# Patient Record
Sex: Female | Born: 1960
Health system: Southern US, Community
[De-identification: ages and names within clinical notes are randomized; demographics above are authoritative.]

## PROBLEM LIST (undated history)

## (undated) DIAGNOSIS — M1009 Idiopathic gout, multiple sites: Secondary | ICD-10-CM

## (undated) DIAGNOSIS — E782 Mixed hyperlipidemia: Secondary | ICD-10-CM

## (undated) DIAGNOSIS — F5101 Primary insomnia: Secondary | ICD-10-CM

## (undated) DIAGNOSIS — K219 Gastro-esophageal reflux disease without esophagitis: Secondary | ICD-10-CM

## (undated) DIAGNOSIS — I2510 Atherosclerotic heart disease of native coronary artery without angina pectoris: Secondary | ICD-10-CM

## (undated) DIAGNOSIS — E079 Disorder of thyroid, unspecified: Secondary | ICD-10-CM

## (undated) DIAGNOSIS — E1121 Type 2 diabetes mellitus with diabetic nephropathy: Secondary | ICD-10-CM

## (undated) DIAGNOSIS — M069 Rheumatoid arthritis, unspecified: Secondary | ICD-10-CM

## (undated) DIAGNOSIS — F419 Anxiety disorder, unspecified: Secondary | ICD-10-CM

## (undated) DIAGNOSIS — J449 Chronic obstructive pulmonary disease, unspecified: Secondary | ICD-10-CM

## (undated) DIAGNOSIS — I639 Cerebral infarction, unspecified: Secondary | ICD-10-CM

## (undated) DIAGNOSIS — E109 Type 1 diabetes mellitus without complications: Secondary | ICD-10-CM

## (undated) DIAGNOSIS — G709 Myoneural disorder, unspecified: Secondary | ICD-10-CM

## (undated) DIAGNOSIS — I2489 Other forms of acute ischemic heart disease: Secondary | ICD-10-CM

## (undated) DIAGNOSIS — I480 Paroxysmal atrial fibrillation: Secondary | ICD-10-CM

## (undated) DIAGNOSIS — I110 Hypertensive heart disease with heart failure: Secondary | ICD-10-CM

## (undated) DIAGNOSIS — I249 Acute ischemic heart disease, unspecified: Secondary | ICD-10-CM

## (undated) DIAGNOSIS — N182 Chronic kidney disease, stage 2 (mild): Secondary | ICD-10-CM

## (undated) DIAGNOSIS — J189 Pneumonia, unspecified organism: Secondary | ICD-10-CM

## (undated) DIAGNOSIS — I469 Cardiac arrest, cause unspecified: Secondary | ICD-10-CM

## (undated) DIAGNOSIS — Z89412 Acquired absence of left great toe: Secondary | ICD-10-CM

## (undated) DIAGNOSIS — N189 Chronic kidney disease, unspecified: Secondary | ICD-10-CM

## (undated) DIAGNOSIS — I5030 Unspecified diastolic (congestive) heart failure: Secondary | ICD-10-CM

## (undated) DIAGNOSIS — R600 Localized edema: Secondary | ICD-10-CM

## (undated) DIAGNOSIS — E559 Vitamin D deficiency, unspecified: Secondary | ICD-10-CM

## (undated) DIAGNOSIS — E038 Other specified hypothyroidism: Secondary | ICD-10-CM

## (undated) DIAGNOSIS — E785 Hyperlipidemia, unspecified: Secondary | ICD-10-CM

## (undated) DIAGNOSIS — G629 Polyneuropathy, unspecified: Secondary | ICD-10-CM

## (undated) DIAGNOSIS — I10 Essential (primary) hypertension: Secondary | ICD-10-CM

## (undated) DIAGNOSIS — D649 Anemia, unspecified: Secondary | ICD-10-CM

## (undated) DIAGNOSIS — M545 Low back pain, unspecified: Secondary | ICD-10-CM

## (undated) DIAGNOSIS — I5032 Chronic diastolic (congestive) heart failure: Secondary | ICD-10-CM

## (undated) DIAGNOSIS — G931 Anoxic brain damage, not elsewhere classified: Secondary | ICD-10-CM

## (undated) DIAGNOSIS — R0902 Hypoxemia: Secondary | ICD-10-CM

## (undated) DIAGNOSIS — G8929 Other chronic pain: Secondary | ICD-10-CM

## (undated) DIAGNOSIS — N3946 Mixed incontinence: Secondary | ICD-10-CM

## (undated) DIAGNOSIS — E119 Type 2 diabetes mellitus without complications: Secondary | ICD-10-CM

## (undated) DIAGNOSIS — I5022 Chronic systolic (congestive) heart failure: Secondary | ICD-10-CM

## (undated) DIAGNOSIS — I340 Nonrheumatic mitral (valve) insufficiency: Secondary | ICD-10-CM

## (undated) DIAGNOSIS — Z89422 Acquired absence of other left toe(s): Secondary | ICD-10-CM

## (undated) DIAGNOSIS — G4733 Obstructive sleep apnea (adult) (pediatric): Secondary | ICD-10-CM

## (undated) DIAGNOSIS — I209 Angina pectoris, unspecified: Secondary | ICD-10-CM

## (undated) DIAGNOSIS — R0600 Dyspnea, unspecified: Secondary | ICD-10-CM

## (undated) HISTORY — DX: Acquired absence of other left toe(s): Z89.422

## (undated) HISTORY — DX: Other chronic pain: G89.29

## (undated) HISTORY — DX: Hypoxemia: R09.02

## (undated) HISTORY — DX: Rheumatoid arthritis, unspecified: M06.9

## (undated) HISTORY — DX: Low back pain, unspecified: M54.50

## (undated) HISTORY — DX: Morbid (severe) obesity due to excess calories: E66.01

## (undated) HISTORY — DX: Gastro-esophageal reflux disease without esophagitis: K21.9

## (undated) HISTORY — DX: Primary insomnia: F51.01

## (undated) HISTORY — PX: ROTATOR CUFF REPAIR: SHX139

## (undated) HISTORY — DX: Mixed incontinence: N39.46

## (undated) HISTORY — DX: Hypertensive heart disease with heart failure: I11.0

## (undated) HISTORY — DX: Idiopathic gout, multiple sites: M10.09

## (undated) HISTORY — DX: Type 2 diabetes mellitus with diabetic nephropathy: E11.21

## (undated) HISTORY — DX: Acquired absence of left great toe: Z89.412

## (undated) HISTORY — PX: CATARACT EXTRACTION, BILATERAL: SHX1313

## (undated) HISTORY — DX: Anoxic brain damage, not elsewhere classified: G93.1

## (undated) HISTORY — DX: Other specified hypothyroidism: E03.8

## (undated) HISTORY — DX: Chronic diastolic (congestive) heart failure: I50.32

## (undated) HISTORY — DX: Acute ischemic heart disease, unspecified: I24.9

## (undated) HISTORY — DX: Vitamin D deficiency, unspecified: E55.9

## (undated) HISTORY — DX: Disorder of thyroid, unspecified: E07.9

## (undated) HISTORY — DX: Chronic systolic (congestive) heart failure: I50.22

## (undated) HISTORY — DX: Cerebral infarction, unspecified: I63.9

## (undated) HISTORY — PX: CORONARY ANGIOPLASTY WITH STENT PLACEMENT: SHX49

## (undated) HISTORY — DX: Chronic kidney disease, stage 2 (mild): N18.2

## (undated) HISTORY — DX: Chronic obstructive pulmonary disease, unspecified: J44.9

## (undated) HISTORY — DX: Mixed hyperlipidemia: E78.2

## (undated) HISTORY — DX: Localized edema: R60.0

## (undated) HISTORY — PX: CARPAL TUNNEL RELEASE: SHX101

## (undated) HISTORY — PX: TOE AMPUTATION: SHX809

## (undated) HISTORY — PX: CHOLECYSTECTOMY: SHX55

## (undated) HISTORY — PX: ABDOMINAL HYSTERECTOMY: SHX81

## (undated) HISTORY — PX: EYE SURGERY: SHX253

## (undated) HISTORY — DX: Atherosclerotic heart disease of native coronary artery without angina pectoris: I25.10

## (undated) MED FILL — Iron Sucrose Inj 20 MG/ML (Fe Equiv): INTRAVENOUS | Qty: 10 | Status: AC

---
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
## 1998-05-26 ENCOUNTER — Ambulatory Visit (HOSPITAL_BASED_OUTPATIENT_CLINIC_OR_DEPARTMENT_OTHER): Admission: RE | Admit: 1998-05-26 | Discharge: 1998-05-26 | Payer: Self-pay | Admitting: Orthopedic Surgery

## 2006-02-01 ENCOUNTER — Encounter: Admission: RE | Admit: 2006-02-01 | Discharge: 2006-02-01 | Payer: Self-pay | Admitting: Orthopedic Surgery

## 2012-07-31 DIAGNOSIS — R002 Palpitations: Secondary | ICD-10-CM | POA: Diagnosis not present

## 2012-07-31 DIAGNOSIS — I10 Essential (primary) hypertension: Secondary | ICD-10-CM | POA: Diagnosis not present

## 2012-07-31 DIAGNOSIS — E785 Hyperlipidemia, unspecified: Secondary | ICD-10-CM | POA: Diagnosis not present

## 2012-07-31 DIAGNOSIS — E119 Type 2 diabetes mellitus without complications: Secondary | ICD-10-CM | POA: Diagnosis not present

## 2012-09-01 DIAGNOSIS — I10 Essential (primary) hypertension: Secondary | ICD-10-CM | POA: Diagnosis not present

## 2012-09-01 DIAGNOSIS — F341 Dysthymic disorder: Secondary | ICD-10-CM | POA: Diagnosis not present

## 2012-09-01 DIAGNOSIS — E7800 Pure hypercholesterolemia, unspecified: Secondary | ICD-10-CM | POA: Diagnosis not present

## 2012-09-01 DIAGNOSIS — IMO0002 Reserved for concepts with insufficient information to code with codable children: Secondary | ICD-10-CM | POA: Diagnosis not present

## 2012-09-01 DIAGNOSIS — R609 Edema, unspecified: Secondary | ICD-10-CM | POA: Diagnosis not present

## 2012-09-01 DIAGNOSIS — M545 Low back pain, unspecified: Secondary | ICD-10-CM | POA: Diagnosis not present

## 2012-09-01 DIAGNOSIS — E1149 Type 2 diabetes mellitus with other diabetic neurological complication: Secondary | ICD-10-CM | POA: Diagnosis not present

## 2012-09-01 DIAGNOSIS — F5102 Adjustment insomnia: Secondary | ICD-10-CM | POA: Diagnosis not present

## 2012-09-03 DIAGNOSIS — IMO0002 Reserved for concepts with insufficient information to code with codable children: Secondary | ICD-10-CM | POA: Diagnosis not present

## 2012-09-03 DIAGNOSIS — E1065 Type 1 diabetes mellitus with hyperglycemia: Secondary | ICD-10-CM | POA: Diagnosis not present

## 2012-09-04 DIAGNOSIS — M6281 Muscle weakness (generalized): Secondary | ICD-10-CM | POA: Diagnosis not present

## 2012-09-04 DIAGNOSIS — E1142 Type 2 diabetes mellitus with diabetic polyneuropathy: Secondary | ICD-10-CM | POA: Diagnosis not present

## 2012-09-04 DIAGNOSIS — R293 Abnormal posture: Secondary | ICD-10-CM | POA: Diagnosis not present

## 2012-09-04 DIAGNOSIS — IMO0001 Reserved for inherently not codable concepts without codable children: Secondary | ICD-10-CM | POA: Diagnosis not present

## 2012-09-04 DIAGNOSIS — R262 Difficulty in walking, not elsewhere classified: Secondary | ICD-10-CM | POA: Diagnosis not present

## 2012-09-04 DIAGNOSIS — M545 Low back pain, unspecified: Secondary | ICD-10-CM | POA: Diagnosis not present

## 2012-09-04 DIAGNOSIS — E1149 Type 2 diabetes mellitus with other diabetic neurological complication: Secondary | ICD-10-CM | POA: Diagnosis not present

## 2012-09-08 DIAGNOSIS — M545 Low back pain, unspecified: Secondary | ICD-10-CM | POA: Diagnosis not present

## 2012-09-08 DIAGNOSIS — R293 Abnormal posture: Secondary | ICD-10-CM | POA: Diagnosis not present

## 2012-09-08 DIAGNOSIS — IMO0001 Reserved for inherently not codable concepts without codable children: Secondary | ICD-10-CM | POA: Diagnosis not present

## 2012-09-08 DIAGNOSIS — E1149 Type 2 diabetes mellitus with other diabetic neurological complication: Secondary | ICD-10-CM | POA: Diagnosis not present

## 2012-09-08 DIAGNOSIS — E1142 Type 2 diabetes mellitus with diabetic polyneuropathy: Secondary | ICD-10-CM | POA: Diagnosis not present

## 2012-09-08 DIAGNOSIS — M6281 Muscle weakness (generalized): Secondary | ICD-10-CM | POA: Diagnosis not present

## 2012-09-09 DIAGNOSIS — E1129 Type 2 diabetes mellitus with other diabetic kidney complication: Secondary | ICD-10-CM | POA: Diagnosis not present

## 2012-09-09 DIAGNOSIS — R894 Abnormal immunological findings in specimens from other organs, systems and tissues: Secondary | ICD-10-CM | POA: Diagnosis not present

## 2012-09-09 DIAGNOSIS — R269 Unspecified abnormalities of gait and mobility: Secondary | ICD-10-CM | POA: Diagnosis not present

## 2012-09-09 DIAGNOSIS — E1142 Type 2 diabetes mellitus with diabetic polyneuropathy: Secondary | ICD-10-CM | POA: Diagnosis not present

## 2012-09-09 DIAGNOSIS — E1139 Type 2 diabetes mellitus with other diabetic ophthalmic complication: Secondary | ICD-10-CM | POA: Diagnosis not present

## 2012-09-09 DIAGNOSIS — R609 Edema, unspecified: Secondary | ICD-10-CM | POA: Diagnosis not present

## 2012-09-09 DIAGNOSIS — IMO0001 Reserved for inherently not codable concepts without codable children: Secondary | ICD-10-CM | POA: Diagnosis not present

## 2012-09-09 DIAGNOSIS — E109 Type 1 diabetes mellitus without complications: Secondary | ICD-10-CM | POA: Diagnosis not present

## 2012-09-09 DIAGNOSIS — Z794 Long term (current) use of insulin: Secondary | ICD-10-CM | POA: Diagnosis not present

## 2012-09-09 DIAGNOSIS — N058 Unspecified nephritic syndrome with other morphologic changes: Secondary | ICD-10-CM | POA: Diagnosis not present

## 2012-09-09 DIAGNOSIS — E11319 Type 2 diabetes mellitus with unspecified diabetic retinopathy without macular edema: Secondary | ICD-10-CM | POA: Diagnosis not present

## 2012-09-09 DIAGNOSIS — E1149 Type 2 diabetes mellitus with other diabetic neurological complication: Secondary | ICD-10-CM | POA: Diagnosis not present

## 2012-09-10 DIAGNOSIS — E1149 Type 2 diabetes mellitus with other diabetic neurological complication: Secondary | ICD-10-CM | POA: Diagnosis not present

## 2012-09-10 DIAGNOSIS — M6281 Muscle weakness (generalized): Secondary | ICD-10-CM | POA: Diagnosis not present

## 2012-09-10 DIAGNOSIS — E1142 Type 2 diabetes mellitus with diabetic polyneuropathy: Secondary | ICD-10-CM | POA: Diagnosis not present

## 2012-09-10 DIAGNOSIS — M545 Low back pain, unspecified: Secondary | ICD-10-CM | POA: Diagnosis not present

## 2012-09-10 DIAGNOSIS — IMO0001 Reserved for inherently not codable concepts without codable children: Secondary | ICD-10-CM | POA: Diagnosis not present

## 2012-09-10 DIAGNOSIS — R293 Abnormal posture: Secondary | ICD-10-CM | POA: Diagnosis not present

## 2012-09-15 DIAGNOSIS — E1149 Type 2 diabetes mellitus with other diabetic neurological complication: Secondary | ICD-10-CM | POA: Diagnosis not present

## 2012-09-15 DIAGNOSIS — M545 Low back pain, unspecified: Secondary | ICD-10-CM | POA: Diagnosis not present

## 2012-09-15 DIAGNOSIS — J069 Acute upper respiratory infection, unspecified: Secondary | ICD-10-CM | POA: Diagnosis not present

## 2012-09-15 DIAGNOSIS — R293 Abnormal posture: Secondary | ICD-10-CM | POA: Diagnosis not present

## 2012-09-15 DIAGNOSIS — R609 Edema, unspecified: Secondary | ICD-10-CM | POA: Diagnosis not present

## 2012-09-15 DIAGNOSIS — F329 Major depressive disorder, single episode, unspecified: Secondary | ICD-10-CM | POA: Diagnosis not present

## 2012-09-15 DIAGNOSIS — IMO0001 Reserved for inherently not codable concepts without codable children: Secondary | ICD-10-CM | POA: Diagnosis not present

## 2012-09-15 DIAGNOSIS — I10 Essential (primary) hypertension: Secondary | ICD-10-CM | POA: Diagnosis not present

## 2012-09-15 DIAGNOSIS — R262 Difficulty in walking, not elsewhere classified: Secondary | ICD-10-CM | POA: Diagnosis not present

## 2012-09-15 DIAGNOSIS — R269 Unspecified abnormalities of gait and mobility: Secondary | ICD-10-CM | POA: Diagnosis not present

## 2012-09-15 DIAGNOSIS — M6281 Muscle weakness (generalized): Secondary | ICD-10-CM | POA: Diagnosis not present

## 2012-09-15 DIAGNOSIS — E1142 Type 2 diabetes mellitus with diabetic polyneuropathy: Secondary | ICD-10-CM | POA: Diagnosis not present

## 2012-09-21 DIAGNOSIS — E119 Type 2 diabetes mellitus without complications: Secondary | ICD-10-CM | POA: Diagnosis not present

## 2012-09-21 DIAGNOSIS — J449 Chronic obstructive pulmonary disease, unspecified: Secondary | ICD-10-CM | POA: Diagnosis not present

## 2012-09-21 DIAGNOSIS — N390 Urinary tract infection, site not specified: Secondary | ICD-10-CM | POA: Diagnosis not present

## 2012-09-21 DIAGNOSIS — R112 Nausea with vomiting, unspecified: Secondary | ICD-10-CM | POA: Diagnosis not present

## 2012-09-21 DIAGNOSIS — B9689 Other specified bacterial agents as the cause of diseases classified elsewhere: Secondary | ICD-10-CM | POA: Diagnosis not present

## 2012-09-21 DIAGNOSIS — I10 Essential (primary) hypertension: Secondary | ICD-10-CM | POA: Diagnosis not present

## 2012-09-21 DIAGNOSIS — Z794 Long term (current) use of insulin: Secondary | ICD-10-CM | POA: Diagnosis not present

## 2012-09-21 DIAGNOSIS — A499 Bacterial infection, unspecified: Secondary | ICD-10-CM | POA: Diagnosis not present

## 2012-09-21 DIAGNOSIS — E7800 Pure hypercholesterolemia, unspecified: Secondary | ICD-10-CM | POA: Diagnosis not present

## 2012-09-21 DIAGNOSIS — I509 Heart failure, unspecified: Secondary | ICD-10-CM | POA: Diagnosis not present

## 2012-09-21 DIAGNOSIS — R1013 Epigastric pain: Secondary | ICD-10-CM | POA: Diagnosis not present

## 2012-09-21 DIAGNOSIS — Z79899 Other long term (current) drug therapy: Secondary | ICD-10-CM | POA: Diagnosis not present

## 2012-09-21 DIAGNOSIS — E1169 Type 2 diabetes mellitus with other specified complication: Secondary | ICD-10-CM | POA: Diagnosis not present

## 2012-09-25 DIAGNOSIS — M545 Low back pain, unspecified: Secondary | ICD-10-CM | POA: Diagnosis not present

## 2012-09-25 DIAGNOSIS — IMO0001 Reserved for inherently not codable concepts without codable children: Secondary | ICD-10-CM | POA: Diagnosis not present

## 2012-09-25 DIAGNOSIS — R293 Abnormal posture: Secondary | ICD-10-CM | POA: Diagnosis not present

## 2012-09-25 DIAGNOSIS — E1149 Type 2 diabetes mellitus with other diabetic neurological complication: Secondary | ICD-10-CM | POA: Diagnosis not present

## 2012-09-25 DIAGNOSIS — M6281 Muscle weakness (generalized): Secondary | ICD-10-CM | POA: Diagnosis not present

## 2012-09-25 DIAGNOSIS — E1142 Type 2 diabetes mellitus with diabetic polyneuropathy: Secondary | ICD-10-CM | POA: Diagnosis not present

## 2012-09-29 DIAGNOSIS — M6281 Muscle weakness (generalized): Secondary | ICD-10-CM | POA: Diagnosis not present

## 2012-09-29 DIAGNOSIS — IMO0001 Reserved for inherently not codable concepts without codable children: Secondary | ICD-10-CM | POA: Diagnosis not present

## 2012-09-29 DIAGNOSIS — R293 Abnormal posture: Secondary | ICD-10-CM | POA: Diagnosis not present

## 2012-09-29 DIAGNOSIS — M545 Low back pain, unspecified: Secondary | ICD-10-CM | POA: Diagnosis not present

## 2012-09-29 DIAGNOSIS — E1142 Type 2 diabetes mellitus with diabetic polyneuropathy: Secondary | ICD-10-CM | POA: Diagnosis not present

## 2012-09-29 DIAGNOSIS — E1149 Type 2 diabetes mellitus with other diabetic neurological complication: Secondary | ICD-10-CM | POA: Diagnosis not present

## 2012-10-01 DIAGNOSIS — R112 Nausea with vomiting, unspecified: Secondary | ICD-10-CM | POA: Diagnosis not present

## 2012-10-01 DIAGNOSIS — K625 Hemorrhage of anus and rectum: Secondary | ICD-10-CM | POA: Diagnosis not present

## 2012-10-01 DIAGNOSIS — K591 Functional diarrhea: Secondary | ICD-10-CM | POA: Diagnosis not present

## 2012-10-02 DIAGNOSIS — M6281 Muscle weakness (generalized): Secondary | ICD-10-CM | POA: Diagnosis not present

## 2012-10-02 DIAGNOSIS — E1149 Type 2 diabetes mellitus with other diabetic neurological complication: Secondary | ICD-10-CM | POA: Diagnosis not present

## 2012-10-02 DIAGNOSIS — R293 Abnormal posture: Secondary | ICD-10-CM | POA: Diagnosis not present

## 2012-10-02 DIAGNOSIS — IMO0001 Reserved for inherently not codable concepts without codable children: Secondary | ICD-10-CM | POA: Diagnosis not present

## 2012-10-02 DIAGNOSIS — M545 Low back pain, unspecified: Secondary | ICD-10-CM | POA: Diagnosis not present

## 2012-10-02 DIAGNOSIS — E1142 Type 2 diabetes mellitus with diabetic polyneuropathy: Secondary | ICD-10-CM | POA: Diagnosis not present

## 2012-10-06 DIAGNOSIS — R269 Unspecified abnormalities of gait and mobility: Secondary | ICD-10-CM | POA: Diagnosis not present

## 2012-10-06 DIAGNOSIS — E1149 Type 2 diabetes mellitus with other diabetic neurological complication: Secondary | ICD-10-CM | POA: Diagnosis not present

## 2012-10-06 DIAGNOSIS — I10 Essential (primary) hypertension: Secondary | ICD-10-CM | POA: Diagnosis not present

## 2012-10-06 DIAGNOSIS — E038 Other specified hypothyroidism: Secondary | ICD-10-CM | POA: Diagnosis not present

## 2012-10-06 DIAGNOSIS — Z79899 Other long term (current) drug therapy: Secondary | ICD-10-CM | POA: Diagnosis not present

## 2012-10-09 DIAGNOSIS — IMO0001 Reserved for inherently not codable concepts without codable children: Secondary | ICD-10-CM | POA: Diagnosis not present

## 2012-10-09 DIAGNOSIS — M6281 Muscle weakness (generalized): Secondary | ICD-10-CM | POA: Diagnosis not present

## 2012-10-09 DIAGNOSIS — E1149 Type 2 diabetes mellitus with other diabetic neurological complication: Secondary | ICD-10-CM | POA: Diagnosis not present

## 2012-10-09 DIAGNOSIS — R293 Abnormal posture: Secondary | ICD-10-CM | POA: Diagnosis not present

## 2012-10-09 DIAGNOSIS — E1142 Type 2 diabetes mellitus with diabetic polyneuropathy: Secondary | ICD-10-CM | POA: Diagnosis not present

## 2012-10-09 DIAGNOSIS — M545 Low back pain, unspecified: Secondary | ICD-10-CM | POA: Diagnosis not present

## 2012-10-13 DIAGNOSIS — R293 Abnormal posture: Secondary | ICD-10-CM | POA: Diagnosis not present

## 2012-10-13 DIAGNOSIS — M6281 Muscle weakness (generalized): Secondary | ICD-10-CM | POA: Diagnosis not present

## 2012-10-13 DIAGNOSIS — E1149 Type 2 diabetes mellitus with other diabetic neurological complication: Secondary | ICD-10-CM | POA: Diagnosis not present

## 2012-10-13 DIAGNOSIS — M545 Low back pain, unspecified: Secondary | ICD-10-CM | POA: Diagnosis not present

## 2012-10-13 DIAGNOSIS — IMO0001 Reserved for inherently not codable concepts without codable children: Secondary | ICD-10-CM | POA: Diagnosis not present

## 2012-10-13 DIAGNOSIS — E1142 Type 2 diabetes mellitus with diabetic polyneuropathy: Secondary | ICD-10-CM | POA: Diagnosis not present

## 2012-10-15 DIAGNOSIS — M6281 Muscle weakness (generalized): Secondary | ICD-10-CM | POA: Diagnosis not present

## 2012-10-15 DIAGNOSIS — IMO0001 Reserved for inherently not codable concepts without codable children: Secondary | ICD-10-CM | POA: Diagnosis not present

## 2012-10-15 DIAGNOSIS — M545 Low back pain, unspecified: Secondary | ICD-10-CM | POA: Diagnosis not present

## 2012-10-15 DIAGNOSIS — E1149 Type 2 diabetes mellitus with other diabetic neurological complication: Secondary | ICD-10-CM | POA: Diagnosis not present

## 2012-10-15 DIAGNOSIS — E1142 Type 2 diabetes mellitus with diabetic polyneuropathy: Secondary | ICD-10-CM | POA: Diagnosis not present

## 2012-10-15 DIAGNOSIS — R262 Difficulty in walking, not elsewhere classified: Secondary | ICD-10-CM | POA: Diagnosis not present

## 2012-10-15 DIAGNOSIS — R279 Unspecified lack of coordination: Secondary | ICD-10-CM | POA: Diagnosis not present

## 2012-10-15 DIAGNOSIS — R293 Abnormal posture: Secondary | ICD-10-CM | POA: Diagnosis not present

## 2012-10-20 DIAGNOSIS — M6281 Muscle weakness (generalized): Secondary | ICD-10-CM | POA: Diagnosis not present

## 2012-10-20 DIAGNOSIS — R293 Abnormal posture: Secondary | ICD-10-CM | POA: Diagnosis not present

## 2012-10-20 DIAGNOSIS — M545 Low back pain, unspecified: Secondary | ICD-10-CM | POA: Diagnosis not present

## 2012-10-20 DIAGNOSIS — E1149 Type 2 diabetes mellitus with other diabetic neurological complication: Secondary | ICD-10-CM | POA: Diagnosis not present

## 2012-10-20 DIAGNOSIS — IMO0001 Reserved for inherently not codable concepts without codable children: Secondary | ICD-10-CM | POA: Diagnosis not present

## 2012-10-20 DIAGNOSIS — E1142 Type 2 diabetes mellitus with diabetic polyneuropathy: Secondary | ICD-10-CM | POA: Diagnosis not present

## 2012-10-28 DIAGNOSIS — M6281 Muscle weakness (generalized): Secondary | ICD-10-CM | POA: Diagnosis not present

## 2012-10-28 DIAGNOSIS — M545 Low back pain, unspecified: Secondary | ICD-10-CM | POA: Diagnosis not present

## 2012-10-28 DIAGNOSIS — R293 Abnormal posture: Secondary | ICD-10-CM | POA: Diagnosis not present

## 2012-10-28 DIAGNOSIS — E1149 Type 2 diabetes mellitus with other diabetic neurological complication: Secondary | ICD-10-CM | POA: Diagnosis not present

## 2012-10-28 DIAGNOSIS — IMO0001 Reserved for inherently not codable concepts without codable children: Secondary | ICD-10-CM | POA: Diagnosis not present

## 2012-10-28 DIAGNOSIS — E1142 Type 2 diabetes mellitus with diabetic polyneuropathy: Secondary | ICD-10-CM | POA: Diagnosis not present

## 2012-10-31 DIAGNOSIS — K591 Functional diarrhea: Secondary | ICD-10-CM | POA: Diagnosis not present

## 2012-10-31 DIAGNOSIS — R197 Diarrhea, unspecified: Secondary | ICD-10-CM | POA: Diagnosis not present

## 2012-10-31 DIAGNOSIS — Z86010 Personal history of colonic polyps: Secondary | ICD-10-CM | POA: Diagnosis not present

## 2012-10-31 DIAGNOSIS — D126 Benign neoplasm of colon, unspecified: Secondary | ICD-10-CM | POA: Diagnosis not present

## 2012-10-31 DIAGNOSIS — K5730 Diverticulosis of large intestine without perforation or abscess without bleeding: Secondary | ICD-10-CM | POA: Diagnosis not present

## 2012-10-31 DIAGNOSIS — K648 Other hemorrhoids: Secondary | ICD-10-CM | POA: Diagnosis not present

## 2012-10-31 DIAGNOSIS — K625 Hemorrhage of anus and rectum: Secondary | ICD-10-CM | POA: Diagnosis not present

## 2012-11-04 DIAGNOSIS — M545 Low back pain, unspecified: Secondary | ICD-10-CM | POA: Diagnosis not present

## 2012-11-04 DIAGNOSIS — E1142 Type 2 diabetes mellitus with diabetic polyneuropathy: Secondary | ICD-10-CM | POA: Diagnosis not present

## 2012-11-04 DIAGNOSIS — M6281 Muscle weakness (generalized): Secondary | ICD-10-CM | POA: Diagnosis not present

## 2012-11-04 DIAGNOSIS — R293 Abnormal posture: Secondary | ICD-10-CM | POA: Diagnosis not present

## 2012-11-04 DIAGNOSIS — IMO0001 Reserved for inherently not codable concepts without codable children: Secondary | ICD-10-CM | POA: Diagnosis not present

## 2012-11-04 DIAGNOSIS — E1149 Type 2 diabetes mellitus with other diabetic neurological complication: Secondary | ICD-10-CM | POA: Diagnosis not present

## 2012-11-06 DIAGNOSIS — M545 Low back pain, unspecified: Secondary | ICD-10-CM | POA: Diagnosis not present

## 2012-11-06 DIAGNOSIS — E1149 Type 2 diabetes mellitus with other diabetic neurological complication: Secondary | ICD-10-CM | POA: Diagnosis not present

## 2012-11-06 DIAGNOSIS — R293 Abnormal posture: Secondary | ICD-10-CM | POA: Diagnosis not present

## 2012-11-06 DIAGNOSIS — IMO0001 Reserved for inherently not codable concepts without codable children: Secondary | ICD-10-CM | POA: Diagnosis not present

## 2012-11-06 DIAGNOSIS — M6281 Muscle weakness (generalized): Secondary | ICD-10-CM | POA: Diagnosis not present

## 2012-11-06 DIAGNOSIS — E1142 Type 2 diabetes mellitus with diabetic polyneuropathy: Secondary | ICD-10-CM | POA: Diagnosis not present

## 2012-11-10 DIAGNOSIS — I10 Essential (primary) hypertension: Secondary | ICD-10-CM | POA: Diagnosis not present

## 2012-11-11 DIAGNOSIS — IMO0001 Reserved for inherently not codable concepts without codable children: Secondary | ICD-10-CM | POA: Diagnosis not present

## 2012-11-11 DIAGNOSIS — R293 Abnormal posture: Secondary | ICD-10-CM | POA: Diagnosis not present

## 2012-11-11 DIAGNOSIS — M6281 Muscle weakness (generalized): Secondary | ICD-10-CM | POA: Diagnosis not present

## 2012-11-11 DIAGNOSIS — E1142 Type 2 diabetes mellitus with diabetic polyneuropathy: Secondary | ICD-10-CM | POA: Diagnosis not present

## 2012-11-11 DIAGNOSIS — M545 Low back pain, unspecified: Secondary | ICD-10-CM | POA: Diagnosis not present

## 2012-11-11 DIAGNOSIS — E1149 Type 2 diabetes mellitus with other diabetic neurological complication: Secondary | ICD-10-CM | POA: Diagnosis not present

## 2012-11-13 DIAGNOSIS — R262 Difficulty in walking, not elsewhere classified: Secondary | ICD-10-CM | POA: Diagnosis not present

## 2012-11-13 DIAGNOSIS — E1142 Type 2 diabetes mellitus with diabetic polyneuropathy: Secondary | ICD-10-CM | POA: Diagnosis not present

## 2012-11-13 DIAGNOSIS — E1149 Type 2 diabetes mellitus with other diabetic neurological complication: Secondary | ICD-10-CM | POA: Diagnosis not present

## 2012-11-13 DIAGNOSIS — R293 Abnormal posture: Secondary | ICD-10-CM | POA: Diagnosis not present

## 2012-11-13 DIAGNOSIS — M545 Low back pain, unspecified: Secondary | ICD-10-CM | POA: Diagnosis not present

## 2012-11-13 DIAGNOSIS — IMO0001 Reserved for inherently not codable concepts without codable children: Secondary | ICD-10-CM | POA: Diagnosis not present

## 2012-11-13 DIAGNOSIS — R279 Unspecified lack of coordination: Secondary | ICD-10-CM | POA: Diagnosis not present

## 2012-11-13 DIAGNOSIS — M6281 Muscle weakness (generalized): Secondary | ICD-10-CM | POA: Diagnosis not present

## 2012-11-14 DIAGNOSIS — S59909A Unspecified injury of unspecified elbow, initial encounter: Secondary | ICD-10-CM | POA: Diagnosis not present

## 2012-11-14 DIAGNOSIS — W010XXA Fall on same level from slipping, tripping and stumbling without subsequent striking against object, initial encounter: Secondary | ICD-10-CM | POA: Diagnosis not present

## 2012-11-14 DIAGNOSIS — S42309A Unspecified fracture of shaft of humerus, unspecified arm, initial encounter for closed fracture: Secondary | ICD-10-CM | POA: Diagnosis not present

## 2012-11-14 DIAGNOSIS — T148XXA Other injury of unspecified body region, initial encounter: Secondary | ICD-10-CM | POA: Diagnosis not present

## 2012-11-18 DIAGNOSIS — S42309A Unspecified fracture of shaft of humerus, unspecified arm, initial encounter for closed fracture: Secondary | ICD-10-CM | POA: Diagnosis not present

## 2012-11-27 DIAGNOSIS — Z2089 Contact with and (suspected) exposure to other communicable diseases: Secondary | ICD-10-CM | POA: Diagnosis not present

## 2012-11-27 DIAGNOSIS — Z0389 Encounter for observation for other suspected diseases and conditions ruled out: Secondary | ICD-10-CM | POA: Diagnosis not present

## 2012-11-27 DIAGNOSIS — R7402 Elevation of levels of lactic acid dehydrogenase (LDH): Secondary | ICD-10-CM | POA: Diagnosis not present

## 2012-11-27 DIAGNOSIS — R748 Abnormal levels of other serum enzymes: Secondary | ICD-10-CM | POA: Diagnosis not present

## 2012-11-27 DIAGNOSIS — Z1159 Encounter for screening for other viral diseases: Secondary | ICD-10-CM | POA: Diagnosis not present

## 2012-11-27 DIAGNOSIS — Z209 Contact with and (suspected) exposure to unspecified communicable disease: Secondary | ICD-10-CM | POA: Diagnosis not present

## 2012-11-27 DIAGNOSIS — Z7289 Other problems related to lifestyle: Secondary | ICD-10-CM | POA: Diagnosis not present

## 2012-11-27 DIAGNOSIS — R21 Rash and other nonspecific skin eruption: Secondary | ICD-10-CM | POA: Diagnosis not present

## 2012-11-27 DIAGNOSIS — R7401 Elevation of levels of liver transaminase levels: Secondary | ICD-10-CM | POA: Diagnosis not present

## 2012-11-27 DIAGNOSIS — S42209A Unspecified fracture of upper end of unspecified humerus, initial encounter for closed fracture: Secondary | ICD-10-CM | POA: Diagnosis not present

## 2012-12-05 DIAGNOSIS — B354 Tinea corporis: Secondary | ICD-10-CM | POA: Diagnosis not present

## 2012-12-05 DIAGNOSIS — R609 Edema, unspecified: Secondary | ICD-10-CM | POA: Diagnosis not present

## 2012-12-11 DIAGNOSIS — R002 Palpitations: Secondary | ICD-10-CM | POA: Diagnosis not present

## 2012-12-11 DIAGNOSIS — Z794 Long term (current) use of insulin: Secondary | ICD-10-CM | POA: Diagnosis not present

## 2012-12-11 DIAGNOSIS — K219 Gastro-esophageal reflux disease without esophagitis: Secondary | ICD-10-CM | POA: Diagnosis not present

## 2012-12-11 DIAGNOSIS — I10 Essential (primary) hypertension: Secondary | ICD-10-CM | POA: Diagnosis not present

## 2012-12-11 DIAGNOSIS — J449 Chronic obstructive pulmonary disease, unspecified: Secondary | ICD-10-CM | POA: Diagnosis not present

## 2012-12-11 DIAGNOSIS — E1169 Type 2 diabetes mellitus with other specified complication: Secondary | ICD-10-CM | POA: Diagnosis not present

## 2012-12-11 DIAGNOSIS — I509 Heart failure, unspecified: Secondary | ICD-10-CM | POA: Diagnosis not present

## 2012-12-11 DIAGNOSIS — E860 Dehydration: Secondary | ICD-10-CM | POA: Diagnosis not present

## 2012-12-11 DIAGNOSIS — E7800 Pure hypercholesterolemia, unspecified: Secondary | ICD-10-CM | POA: Diagnosis not present

## 2012-12-11 DIAGNOSIS — E119 Type 2 diabetes mellitus without complications: Secondary | ICD-10-CM | POA: Diagnosis not present

## 2012-12-11 DIAGNOSIS — Z79899 Other long term (current) drug therapy: Secondary | ICD-10-CM | POA: Diagnosis not present

## 2012-12-16 DIAGNOSIS — K219 Gastro-esophageal reflux disease without esophagitis: Secondary | ICD-10-CM | POA: Diagnosis not present

## 2012-12-16 DIAGNOSIS — J449 Chronic obstructive pulmonary disease, unspecified: Secondary | ICD-10-CM | POA: Diagnosis not present

## 2012-12-16 DIAGNOSIS — E7800 Pure hypercholesterolemia, unspecified: Secondary | ICD-10-CM | POA: Diagnosis not present

## 2012-12-16 DIAGNOSIS — Z794 Long term (current) use of insulin: Secondary | ICD-10-CM | POA: Diagnosis not present

## 2012-12-16 DIAGNOSIS — R5383 Other fatigue: Secondary | ICD-10-CM | POA: Diagnosis not present

## 2012-12-16 DIAGNOSIS — Z79899 Other long term (current) drug therapy: Secondary | ICD-10-CM | POA: Diagnosis not present

## 2012-12-16 DIAGNOSIS — E1169 Type 2 diabetes mellitus with other specified complication: Secondary | ICD-10-CM | POA: Diagnosis not present

## 2012-12-16 DIAGNOSIS — R112 Nausea with vomiting, unspecified: Secondary | ICD-10-CM | POA: Diagnosis not present

## 2012-12-16 DIAGNOSIS — E119 Type 2 diabetes mellitus without complications: Secondary | ICD-10-CM | POA: Diagnosis not present

## 2012-12-16 DIAGNOSIS — I509 Heart failure, unspecified: Secondary | ICD-10-CM | POA: Diagnosis not present

## 2012-12-16 DIAGNOSIS — E860 Dehydration: Secondary | ICD-10-CM | POA: Diagnosis not present

## 2012-12-16 DIAGNOSIS — R5381 Other malaise: Secondary | ICD-10-CM | POA: Diagnosis not present

## 2012-12-16 DIAGNOSIS — S42309A Unspecified fracture of shaft of humerus, unspecified arm, initial encounter for closed fracture: Secondary | ICD-10-CM | POA: Diagnosis not present

## 2012-12-16 DIAGNOSIS — I10 Essential (primary) hypertension: Secondary | ICD-10-CM | POA: Diagnosis not present

## 2012-12-25 DIAGNOSIS — M545 Low back pain, unspecified: Secondary | ICD-10-CM | POA: Diagnosis not present

## 2012-12-25 DIAGNOSIS — K3184 Gastroparesis: Secondary | ICD-10-CM | POA: Diagnosis not present

## 2013-01-06 DIAGNOSIS — M545 Low back pain, unspecified: Secondary | ICD-10-CM | POA: Diagnosis not present

## 2013-01-13 DIAGNOSIS — IMO0002 Reserved for concepts with insufficient information to code with codable children: Secondary | ICD-10-CM | POA: Diagnosis not present

## 2013-01-13 DIAGNOSIS — E1065 Type 1 diabetes mellitus with hyperglycemia: Secondary | ICD-10-CM | POA: Diagnosis not present

## 2013-01-13 DIAGNOSIS — Z9119 Patient's noncompliance with other medical treatment and regimen: Secondary | ICD-10-CM | POA: Diagnosis not present

## 2013-01-13 DIAGNOSIS — Z91199 Patient's noncompliance with other medical treatment and regimen due to unspecified reason: Secondary | ICD-10-CM | POA: Diagnosis not present

## 2013-01-20 DIAGNOSIS — R5381 Other malaise: Secondary | ICD-10-CM | POA: Diagnosis not present

## 2013-01-20 DIAGNOSIS — R42 Dizziness and giddiness: Secondary | ICD-10-CM | POA: Diagnosis not present

## 2013-01-20 DIAGNOSIS — I509 Heart failure, unspecified: Secondary | ICD-10-CM | POA: Diagnosis not present

## 2013-01-20 DIAGNOSIS — R296 Repeated falls: Secondary | ICD-10-CM | POA: Diagnosis not present

## 2013-01-20 DIAGNOSIS — E1049 Type 1 diabetes mellitus with other diabetic neurological complication: Secondary | ICD-10-CM | POA: Diagnosis not present

## 2013-01-20 DIAGNOSIS — E1142 Type 2 diabetes mellitus with diabetic polyneuropathy: Secondary | ICD-10-CM | POA: Diagnosis not present

## 2013-01-20 DIAGNOSIS — R5383 Other fatigue: Secondary | ICD-10-CM | POA: Diagnosis not present

## 2013-01-20 DIAGNOSIS — E119 Type 2 diabetes mellitus without complications: Secondary | ICD-10-CM | POA: Diagnosis not present

## 2013-01-20 DIAGNOSIS — E7800 Pure hypercholesterolemia, unspecified: Secondary | ICD-10-CM | POA: Diagnosis not present

## 2013-01-20 DIAGNOSIS — B351 Tinea unguium: Secondary | ICD-10-CM | POA: Diagnosis not present

## 2013-01-20 DIAGNOSIS — J449 Chronic obstructive pulmonary disease, unspecified: Secondary | ICD-10-CM | POA: Diagnosis not present

## 2013-01-20 DIAGNOSIS — I10 Essential (primary) hypertension: Secondary | ICD-10-CM | POA: Diagnosis not present

## 2013-01-20 DIAGNOSIS — K219 Gastro-esophageal reflux disease without esophagitis: Secondary | ICD-10-CM | POA: Diagnosis not present

## 2013-01-26 DIAGNOSIS — M47817 Spondylosis without myelopathy or radiculopathy, lumbosacral region: Secondary | ICD-10-CM | POA: Diagnosis not present

## 2013-01-26 DIAGNOSIS — R4789 Other speech disturbances: Secondary | ICD-10-CM | POA: Diagnosis not present

## 2013-01-26 DIAGNOSIS — R413 Other amnesia: Secondary | ICD-10-CM | POA: Diagnosis not present

## 2013-02-04 DIAGNOSIS — I509 Heart failure, unspecified: Secondary | ICD-10-CM | POA: Diagnosis not present

## 2013-02-04 DIAGNOSIS — S42309A Unspecified fracture of shaft of humerus, unspecified arm, initial encounter for closed fracture: Secondary | ICD-10-CM | POA: Diagnosis not present

## 2013-02-04 DIAGNOSIS — E1169 Type 2 diabetes mellitus with other specified complication: Secondary | ICD-10-CM | POA: Diagnosis not present

## 2013-02-04 DIAGNOSIS — J449 Chronic obstructive pulmonary disease, unspecified: Secondary | ICD-10-CM | POA: Diagnosis not present

## 2013-02-04 DIAGNOSIS — E7800 Pure hypercholesterolemia, unspecified: Secondary | ICD-10-CM | POA: Diagnosis not present

## 2013-02-04 DIAGNOSIS — I10 Essential (primary) hypertension: Secondary | ICD-10-CM | POA: Diagnosis not present

## 2013-02-04 DIAGNOSIS — Z79899 Other long term (current) drug therapy: Secondary | ICD-10-CM | POA: Diagnosis not present

## 2013-02-04 DIAGNOSIS — K2970 Gastritis, unspecified, without bleeding: Secondary | ICD-10-CM | POA: Diagnosis not present

## 2013-02-04 DIAGNOSIS — E119 Type 2 diabetes mellitus without complications: Secondary | ICD-10-CM | POA: Diagnosis not present

## 2013-02-04 DIAGNOSIS — K5900 Constipation, unspecified: Secondary | ICD-10-CM | POA: Diagnosis not present

## 2013-02-04 DIAGNOSIS — Z794 Long term (current) use of insulin: Secondary | ICD-10-CM | POA: Diagnosis not present

## 2013-02-04 DIAGNOSIS — R1032 Left lower quadrant pain: Secondary | ICD-10-CM | POA: Diagnosis not present

## 2013-02-04 DIAGNOSIS — R110 Nausea: Secondary | ICD-10-CM | POA: Diagnosis not present

## 2013-02-04 DIAGNOSIS — K219 Gastro-esophageal reflux disease without esophagitis: Secondary | ICD-10-CM | POA: Diagnosis not present

## 2013-02-04 DIAGNOSIS — R7309 Other abnormal glucose: Secondary | ICD-10-CM | POA: Diagnosis not present

## 2013-02-18 DIAGNOSIS — S42309A Unspecified fracture of shaft of humerus, unspecified arm, initial encounter for closed fracture: Secondary | ICD-10-CM | POA: Diagnosis not present

## 2013-02-19 DIAGNOSIS — E1065 Type 1 diabetes mellitus with hyperglycemia: Secondary | ICD-10-CM | POA: Diagnosis not present

## 2013-02-19 DIAGNOSIS — E1049 Type 1 diabetes mellitus with other diabetic neurological complication: Secondary | ICD-10-CM | POA: Diagnosis not present

## 2013-02-23 DIAGNOSIS — S42309A Unspecified fracture of shaft of humerus, unspecified arm, initial encounter for closed fracture: Secondary | ICD-10-CM | POA: Diagnosis not present

## 2013-02-25 DIAGNOSIS — R112 Nausea with vomiting, unspecified: Secondary | ICD-10-CM | POA: Diagnosis not present

## 2013-02-25 DIAGNOSIS — R6881 Early satiety: Secondary | ICD-10-CM | POA: Diagnosis not present

## 2013-02-25 DIAGNOSIS — R7309 Other abnormal glucose: Secondary | ICD-10-CM | POA: Diagnosis not present

## 2013-02-25 DIAGNOSIS — R198 Other specified symptoms and signs involving the digestive system and abdomen: Secondary | ICD-10-CM | POA: Diagnosis not present

## 2013-02-25 DIAGNOSIS — R197 Diarrhea, unspecified: Secondary | ICD-10-CM | POA: Diagnosis not present

## 2013-02-25 DIAGNOSIS — K140 Glossitis: Secondary | ICD-10-CM | POA: Diagnosis not present

## 2013-02-26 DIAGNOSIS — S42309A Unspecified fracture of shaft of humerus, unspecified arm, initial encounter for closed fracture: Secondary | ICD-10-CM | POA: Diagnosis not present

## 2013-02-26 DIAGNOSIS — M47817 Spondylosis without myelopathy or radiculopathy, lumbosacral region: Secondary | ICD-10-CM | POA: Diagnosis not present

## 2013-02-26 DIAGNOSIS — R4789 Other speech disturbances: Secondary | ICD-10-CM | POA: Diagnosis not present

## 2013-02-26 DIAGNOSIS — E1049 Type 1 diabetes mellitus with other diabetic neurological complication: Secondary | ICD-10-CM | POA: Diagnosis not present

## 2013-03-01 DIAGNOSIS — R140 Abdominal distension (gaseous): Secondary | ICD-10-CM | POA: Insufficient documentation

## 2013-03-04 DIAGNOSIS — S42309A Unspecified fracture of shaft of humerus, unspecified arm, initial encounter for closed fracture: Secondary | ICD-10-CM | POA: Diagnosis not present

## 2013-03-09 DIAGNOSIS — S42309A Unspecified fracture of shaft of humerus, unspecified arm, initial encounter for closed fracture: Secondary | ICD-10-CM | POA: Diagnosis not present

## 2013-03-11 DIAGNOSIS — S42309A Unspecified fracture of shaft of humerus, unspecified arm, initial encounter for closed fracture: Secondary | ICD-10-CM | POA: Diagnosis not present

## 2013-03-17 DIAGNOSIS — S42309A Unspecified fracture of shaft of humerus, unspecified arm, initial encounter for closed fracture: Secondary | ICD-10-CM | POA: Diagnosis not present

## 2013-03-18 DIAGNOSIS — S42309A Unspecified fracture of shaft of humerus, unspecified arm, initial encounter for closed fracture: Secondary | ICD-10-CM | POA: Diagnosis not present

## 2013-03-23 DIAGNOSIS — S42309A Unspecified fracture of shaft of humerus, unspecified arm, initial encounter for closed fracture: Secondary | ICD-10-CM | POA: Diagnosis not present

## 2013-03-23 DIAGNOSIS — M25819 Other specified joint disorders, unspecified shoulder: Secondary | ICD-10-CM | POA: Diagnosis not present

## 2013-03-23 DIAGNOSIS — S46909A Unspecified injury of unspecified muscle, fascia and tendon at shoulder and upper arm level, unspecified arm, initial encounter: Secondary | ICD-10-CM | POA: Diagnosis not present

## 2013-03-23 DIAGNOSIS — S4980XA Other specified injuries of shoulder and upper arm, unspecified arm, initial encounter: Secondary | ICD-10-CM | POA: Diagnosis not present

## 2013-03-23 DIAGNOSIS — R937 Abnormal findings on diagnostic imaging of other parts of musculoskeletal system: Secondary | ICD-10-CM | POA: Diagnosis not present

## 2013-03-25 DIAGNOSIS — S42309A Unspecified fracture of shaft of humerus, unspecified arm, initial encounter for closed fracture: Secondary | ICD-10-CM | POA: Diagnosis not present

## 2013-03-25 DIAGNOSIS — M7530 Calcific tendinitis of unspecified shoulder: Secondary | ICD-10-CM | POA: Diagnosis not present

## 2013-03-26 DIAGNOSIS — R002 Palpitations: Secondary | ICD-10-CM | POA: Diagnosis not present

## 2013-03-26 DIAGNOSIS — J449 Chronic obstructive pulmonary disease, unspecified: Secondary | ICD-10-CM | POA: Diagnosis not present

## 2013-03-26 DIAGNOSIS — D649 Anemia, unspecified: Secondary | ICD-10-CM | POA: Diagnosis not present

## 2013-03-26 DIAGNOSIS — E785 Hyperlipidemia, unspecified: Secondary | ICD-10-CM | POA: Diagnosis not present

## 2013-03-26 DIAGNOSIS — R198 Other specified symptoms and signs involving the digestive system and abdomen: Secondary | ICD-10-CM | POA: Diagnosis not present

## 2013-03-26 DIAGNOSIS — E039 Hypothyroidism, unspecified: Secondary | ICD-10-CM | POA: Diagnosis not present

## 2013-03-26 DIAGNOSIS — R112 Nausea with vomiting, unspecified: Secondary | ICD-10-CM | POA: Diagnosis not present

## 2013-03-26 DIAGNOSIS — K219 Gastro-esophageal reflux disease without esophagitis: Secondary | ICD-10-CM | POA: Diagnosis not present

## 2013-03-26 DIAGNOSIS — R6881 Early satiety: Secondary | ICD-10-CM | POA: Diagnosis not present

## 2013-03-26 DIAGNOSIS — E119 Type 2 diabetes mellitus without complications: Secondary | ICD-10-CM | POA: Diagnosis not present

## 2013-03-26 DIAGNOSIS — R197 Diarrhea, unspecified: Secondary | ICD-10-CM | POA: Diagnosis not present

## 2013-03-27 DIAGNOSIS — M25519 Pain in unspecified shoulder: Secondary | ICD-10-CM | POA: Diagnosis not present

## 2013-03-30 DIAGNOSIS — S42309A Unspecified fracture of shaft of humerus, unspecified arm, initial encounter for closed fracture: Secondary | ICD-10-CM | POA: Diagnosis not present

## 2013-04-03 DIAGNOSIS — R141 Gas pain: Secondary | ICD-10-CM | POA: Diagnosis not present

## 2013-04-03 DIAGNOSIS — R198 Other specified symptoms and signs involving the digestive system and abdomen: Secondary | ICD-10-CM | POA: Diagnosis not present

## 2013-04-03 DIAGNOSIS — R6881 Early satiety: Secondary | ICD-10-CM | POA: Diagnosis not present

## 2013-04-03 DIAGNOSIS — R112 Nausea with vomiting, unspecified: Secondary | ICD-10-CM | POA: Diagnosis not present

## 2013-04-03 DIAGNOSIS — R110 Nausea: Secondary | ICD-10-CM | POA: Diagnosis not present

## 2013-04-03 DIAGNOSIS — R197 Diarrhea, unspecified: Secondary | ICD-10-CM | POA: Diagnosis not present

## 2013-04-06 DIAGNOSIS — R112 Nausea with vomiting, unspecified: Secondary | ICD-10-CM | POA: Diagnosis not present

## 2013-04-06 DIAGNOSIS — K3189 Other diseases of stomach and duodenum: Secondary | ICD-10-CM | POA: Diagnosis not present

## 2013-04-06 DIAGNOSIS — K219 Gastro-esophageal reflux disease without esophagitis: Secondary | ICD-10-CM | POA: Diagnosis not present

## 2013-04-06 DIAGNOSIS — R6881 Early satiety: Secondary | ICD-10-CM | POA: Diagnosis not present

## 2013-04-07 DIAGNOSIS — S42309A Unspecified fracture of shaft of humerus, unspecified arm, initial encounter for closed fracture: Secondary | ICD-10-CM | POA: Diagnosis not present

## 2013-04-08 DIAGNOSIS — M545 Low back pain, unspecified: Secondary | ICD-10-CM | POA: Diagnosis not present

## 2013-04-08 DIAGNOSIS — M47817 Spondylosis without myelopathy or radiculopathy, lumbosacral region: Secondary | ICD-10-CM | POA: Diagnosis not present

## 2013-04-13 DIAGNOSIS — S42309A Unspecified fracture of shaft of humerus, unspecified arm, initial encounter for closed fracture: Secondary | ICD-10-CM | POA: Diagnosis not present

## 2013-04-17 DIAGNOSIS — E1142 Type 2 diabetes mellitus with diabetic polyneuropathy: Secondary | ICD-10-CM | POA: Diagnosis not present

## 2013-04-17 DIAGNOSIS — E1049 Type 1 diabetes mellitus with other diabetic neurological complication: Secondary | ICD-10-CM | POA: Diagnosis not present

## 2013-04-17 DIAGNOSIS — L02619 Cutaneous abscess of unspecified foot: Secondary | ICD-10-CM | POA: Diagnosis not present

## 2013-04-22 DIAGNOSIS — E1142 Type 2 diabetes mellitus with diabetic polyneuropathy: Secondary | ICD-10-CM | POA: Diagnosis not present

## 2013-04-22 DIAGNOSIS — L02619 Cutaneous abscess of unspecified foot: Secondary | ICD-10-CM | POA: Diagnosis not present

## 2013-04-22 DIAGNOSIS — E1049 Type 1 diabetes mellitus with other diabetic neurological complication: Secondary | ICD-10-CM | POA: Diagnosis not present

## 2013-04-22 DIAGNOSIS — R52 Pain, unspecified: Secondary | ICD-10-CM | POA: Diagnosis not present

## 2013-04-30 DIAGNOSIS — S42309A Unspecified fracture of shaft of humerus, unspecified arm, initial encounter for closed fracture: Secondary | ICD-10-CM | POA: Diagnosis not present

## 2013-05-04 DIAGNOSIS — S42309A Unspecified fracture of shaft of humerus, unspecified arm, initial encounter for closed fracture: Secondary | ICD-10-CM | POA: Diagnosis not present

## 2013-05-06 DIAGNOSIS — S42309A Unspecified fracture of shaft of humerus, unspecified arm, initial encounter for closed fracture: Secondary | ICD-10-CM | POA: Diagnosis not present

## 2013-05-11 DIAGNOSIS — M545 Low back pain, unspecified: Secondary | ICD-10-CM | POA: Diagnosis not present

## 2013-05-11 DIAGNOSIS — S42309A Unspecified fracture of shaft of humerus, unspecified arm, initial encounter for closed fracture: Secondary | ICD-10-CM | POA: Diagnosis not present

## 2013-05-11 DIAGNOSIS — M47817 Spondylosis without myelopathy or radiculopathy, lumbosacral region: Secondary | ICD-10-CM | POA: Diagnosis not present

## 2013-05-13 DIAGNOSIS — E1142 Type 2 diabetes mellitus with diabetic polyneuropathy: Secondary | ICD-10-CM | POA: Diagnosis not present

## 2013-05-13 DIAGNOSIS — E1049 Type 1 diabetes mellitus with other diabetic neurological complication: Secondary | ICD-10-CM | POA: Diagnosis not present

## 2013-05-14 DIAGNOSIS — S42309A Unspecified fracture of shaft of humerus, unspecified arm, initial encounter for closed fracture: Secondary | ICD-10-CM | POA: Diagnosis not present

## 2013-05-14 DIAGNOSIS — M47817 Spondylosis without myelopathy or radiculopathy, lumbosacral region: Secondary | ICD-10-CM | POA: Diagnosis not present

## 2013-05-14 DIAGNOSIS — M545 Low back pain, unspecified: Secondary | ICD-10-CM | POA: Diagnosis not present

## 2013-05-18 DIAGNOSIS — Z23 Encounter for immunization: Secondary | ICD-10-CM | POA: Diagnosis not present

## 2013-05-21 DIAGNOSIS — S42309A Unspecified fracture of shaft of humerus, unspecified arm, initial encounter for closed fracture: Secondary | ICD-10-CM | POA: Diagnosis not present

## 2013-05-25 DIAGNOSIS — E119 Type 2 diabetes mellitus without complications: Secondary | ICD-10-CM | POA: Diagnosis not present

## 2013-05-26 DIAGNOSIS — E1049 Type 1 diabetes mellitus with other diabetic neurological complication: Secondary | ICD-10-CM | POA: Diagnosis not present

## 2013-05-26 DIAGNOSIS — L02619 Cutaneous abscess of unspecified foot: Secondary | ICD-10-CM | POA: Diagnosis not present

## 2013-05-26 DIAGNOSIS — L03039 Cellulitis of unspecified toe: Secondary | ICD-10-CM | POA: Diagnosis not present

## 2013-05-27 DIAGNOSIS — M545 Low back pain, unspecified: Secondary | ICD-10-CM | POA: Diagnosis not present

## 2013-05-27 DIAGNOSIS — M542 Cervicalgia: Secondary | ICD-10-CM | POA: Diagnosis not present

## 2013-05-28 DIAGNOSIS — S42309A Unspecified fracture of shaft of humerus, unspecified arm, initial encounter for closed fracture: Secondary | ICD-10-CM | POA: Diagnosis not present

## 2013-06-02 DIAGNOSIS — M25519 Pain in unspecified shoulder: Secondary | ICD-10-CM | POA: Diagnosis not present

## 2013-06-02 DIAGNOSIS — M7530 Calcific tendinitis of unspecified shoulder: Secondary | ICD-10-CM | POA: Diagnosis not present

## 2013-06-03 DIAGNOSIS — Z79899 Other long term (current) drug therapy: Secondary | ICD-10-CM | POA: Diagnosis not present

## 2013-06-03 DIAGNOSIS — R112 Nausea with vomiting, unspecified: Secondary | ICD-10-CM | POA: Diagnosis not present

## 2013-06-03 DIAGNOSIS — K3184 Gastroparesis: Secondary | ICD-10-CM | POA: Diagnosis not present

## 2013-06-03 DIAGNOSIS — E1149 Type 2 diabetes mellitus with other diabetic neurological complication: Secondary | ICD-10-CM | POA: Diagnosis not present

## 2013-06-04 DIAGNOSIS — S42309A Unspecified fracture of shaft of humerus, unspecified arm, initial encounter for closed fracture: Secondary | ICD-10-CM | POA: Diagnosis not present

## 2013-06-08 DIAGNOSIS — E1149 Type 2 diabetes mellitus with other diabetic neurological complication: Secondary | ICD-10-CM | POA: Diagnosis not present

## 2013-06-08 DIAGNOSIS — E7800 Pure hypercholesterolemia, unspecified: Secondary | ICD-10-CM | POA: Diagnosis not present

## 2013-06-08 DIAGNOSIS — K5901 Slow transit constipation: Secondary | ICD-10-CM | POA: Diagnosis not present

## 2013-06-08 DIAGNOSIS — Z23 Encounter for immunization: Secondary | ICD-10-CM | POA: Diagnosis not present

## 2013-06-08 DIAGNOSIS — I10 Essential (primary) hypertension: Secondary | ICD-10-CM | POA: Diagnosis not present

## 2013-06-08 DIAGNOSIS — E038 Other specified hypothyroidism: Secondary | ICD-10-CM | POA: Diagnosis not present

## 2013-06-08 DIAGNOSIS — M47817 Spondylosis without myelopathy or radiculopathy, lumbosacral region: Secondary | ICD-10-CM | POA: Diagnosis not present

## 2013-06-08 DIAGNOSIS — E1049 Type 1 diabetes mellitus with other diabetic neurological complication: Secondary | ICD-10-CM | POA: Diagnosis not present

## 2013-06-09 DIAGNOSIS — S42309A Unspecified fracture of shaft of humerus, unspecified arm, initial encounter for closed fracture: Secondary | ICD-10-CM | POA: Diagnosis not present

## 2013-06-15 DIAGNOSIS — S42309A Unspecified fracture of shaft of humerus, unspecified arm, initial encounter for closed fracture: Secondary | ICD-10-CM | POA: Diagnosis not present

## 2013-06-18 DIAGNOSIS — S42309A Unspecified fracture of shaft of humerus, unspecified arm, initial encounter for closed fracture: Secondary | ICD-10-CM | POA: Diagnosis not present

## 2013-06-25 DIAGNOSIS — S42309A Unspecified fracture of shaft of humerus, unspecified arm, initial encounter for closed fracture: Secondary | ICD-10-CM | POA: Diagnosis not present

## 2013-07-02 DIAGNOSIS — S42309A Unspecified fracture of shaft of humerus, unspecified arm, initial encounter for closed fracture: Secondary | ICD-10-CM | POA: Diagnosis not present

## 2013-07-13 DIAGNOSIS — S42309A Unspecified fracture of shaft of humerus, unspecified arm, initial encounter for closed fracture: Secondary | ICD-10-CM | POA: Diagnosis not present

## 2013-08-05 DIAGNOSIS — K6389 Other specified diseases of intestine: Secondary | ICD-10-CM | POA: Diagnosis not present

## 2013-08-11 DIAGNOSIS — B351 Tinea unguium: Secondary | ICD-10-CM | POA: Diagnosis not present

## 2013-08-11 DIAGNOSIS — E1049 Type 1 diabetes mellitus with other diabetic neurological complication: Secondary | ICD-10-CM | POA: Diagnosis not present

## 2013-08-11 DIAGNOSIS — E1142 Type 2 diabetes mellitus with diabetic polyneuropathy: Secondary | ICD-10-CM | POA: Diagnosis not present

## 2013-08-11 DIAGNOSIS — S98919A Complete traumatic amputation of unspecified foot, level unspecified, initial encounter: Secondary | ICD-10-CM | POA: Diagnosis not present

## 2013-08-18 DIAGNOSIS — S6990XA Unspecified injury of unspecified wrist, hand and finger(s), initial encounter: Secondary | ICD-10-CM | POA: Diagnosis not present

## 2013-08-18 DIAGNOSIS — S59909A Unspecified injury of unspecified elbow, initial encounter: Secondary | ICD-10-CM | POA: Diagnosis not present

## 2013-08-18 DIAGNOSIS — W1809XA Striking against other object with subsequent fall, initial encounter: Secondary | ICD-10-CM | POA: Diagnosis not present

## 2013-08-18 DIAGNOSIS — J449 Chronic obstructive pulmonary disease, unspecified: Secondary | ICD-10-CM | POA: Diagnosis not present

## 2013-08-18 DIAGNOSIS — M79609 Pain in unspecified limb: Secondary | ICD-10-CM | POA: Diagnosis not present

## 2013-08-18 DIAGNOSIS — K219 Gastro-esophageal reflux disease without esophagitis: Secondary | ICD-10-CM | POA: Diagnosis not present

## 2013-08-18 DIAGNOSIS — E7800 Pure hypercholesterolemia, unspecified: Secondary | ICD-10-CM | POA: Diagnosis not present

## 2013-08-18 DIAGNOSIS — M25519 Pain in unspecified shoulder: Secondary | ICD-10-CM | POA: Diagnosis not present

## 2013-08-18 DIAGNOSIS — E119 Type 2 diabetes mellitus without complications: Secondary | ICD-10-CM | POA: Diagnosis not present

## 2013-08-18 DIAGNOSIS — I509 Heart failure, unspecified: Secondary | ICD-10-CM | POA: Diagnosis not present

## 2013-08-18 DIAGNOSIS — I10 Essential (primary) hypertension: Secondary | ICD-10-CM | POA: Diagnosis not present

## 2013-08-18 DIAGNOSIS — Z79899 Other long term (current) drug therapy: Secondary | ICD-10-CM | POA: Diagnosis not present

## 2013-08-18 DIAGNOSIS — Z794 Long term (current) use of insulin: Secondary | ICD-10-CM | POA: Diagnosis not present

## 2013-08-18 DIAGNOSIS — S42213A Unspecified displaced fracture of surgical neck of unspecified humerus, initial encounter for closed fracture: Secondary | ICD-10-CM | POA: Diagnosis not present

## 2013-09-21 DIAGNOSIS — S42213A Unspecified displaced fracture of surgical neck of unspecified humerus, initial encounter for closed fracture: Secondary | ICD-10-CM | POA: Diagnosis not present

## 2013-09-22 DIAGNOSIS — K5900 Constipation, unspecified: Secondary | ICD-10-CM | POA: Diagnosis not present

## 2013-09-22 DIAGNOSIS — Z794 Long term (current) use of insulin: Secondary | ICD-10-CM | POA: Diagnosis not present

## 2013-09-22 DIAGNOSIS — J449 Chronic obstructive pulmonary disease, unspecified: Secondary | ICD-10-CM | POA: Diagnosis not present

## 2013-09-22 DIAGNOSIS — E7800 Pure hypercholesterolemia, unspecified: Secondary | ICD-10-CM | POA: Diagnosis not present

## 2013-09-22 DIAGNOSIS — Z79899 Other long term (current) drug therapy: Secondary | ICD-10-CM | POA: Diagnosis not present

## 2013-09-22 DIAGNOSIS — K219 Gastro-esophageal reflux disease without esophagitis: Secondary | ICD-10-CM | POA: Diagnosis not present

## 2013-09-22 DIAGNOSIS — R002 Palpitations: Secondary | ICD-10-CM | POA: Diagnosis not present

## 2013-09-22 DIAGNOSIS — I509 Heart failure, unspecified: Secondary | ICD-10-CM | POA: Diagnosis not present

## 2013-09-22 DIAGNOSIS — I10 Essential (primary) hypertension: Secondary | ICD-10-CM | POA: Diagnosis not present

## 2013-09-22 DIAGNOSIS — R5381 Other malaise: Secondary | ICD-10-CM | POA: Diagnosis not present

## 2013-09-22 DIAGNOSIS — E119 Type 2 diabetes mellitus without complications: Secondary | ICD-10-CM | POA: Diagnosis not present

## 2013-09-22 DIAGNOSIS — R1084 Generalized abdominal pain: Secondary | ICD-10-CM | POA: Diagnosis not present

## 2013-09-23 DIAGNOSIS — K5900 Constipation, unspecified: Secondary | ICD-10-CM | POA: Diagnosis not present

## 2013-09-28 DIAGNOSIS — M84429A Pathological fracture, unspecified humerus, initial encounter for fracture: Secondary | ICD-10-CM | POA: Diagnosis not present

## 2013-10-01 DIAGNOSIS — M47817 Spondylosis without myelopathy or radiculopathy, lumbosacral region: Secondary | ICD-10-CM | POA: Diagnosis not present

## 2013-10-01 DIAGNOSIS — E7800 Pure hypercholesterolemia, unspecified: Secondary | ICD-10-CM | POA: Diagnosis not present

## 2013-10-01 DIAGNOSIS — E038 Other specified hypothyroidism: Secondary | ICD-10-CM | POA: Diagnosis not present

## 2013-10-01 DIAGNOSIS — I10 Essential (primary) hypertension: Secondary | ICD-10-CM | POA: Diagnosis not present

## 2013-10-01 DIAGNOSIS — E1149 Type 2 diabetes mellitus with other diabetic neurological complication: Secondary | ICD-10-CM | POA: Diagnosis not present

## 2013-10-01 DIAGNOSIS — Z23 Encounter for immunization: Secondary | ICD-10-CM | POA: Diagnosis not present

## 2013-10-01 DIAGNOSIS — M84429A Pathological fracture, unspecified humerus, initial encounter for fracture: Secondary | ICD-10-CM | POA: Diagnosis not present

## 2013-10-01 DIAGNOSIS — K5901 Slow transit constipation: Secondary | ICD-10-CM | POA: Diagnosis not present

## 2013-10-06 DIAGNOSIS — M84429A Pathological fracture, unspecified humerus, initial encounter for fracture: Secondary | ICD-10-CM | POA: Diagnosis not present

## 2013-10-08 DIAGNOSIS — M84429A Pathological fracture, unspecified humerus, initial encounter for fracture: Secondary | ICD-10-CM | POA: Diagnosis not present

## 2013-10-08 DIAGNOSIS — S42309A Unspecified fracture of shaft of humerus, unspecified arm, initial encounter for closed fracture: Secondary | ICD-10-CM | POA: Diagnosis not present

## 2013-10-13 DIAGNOSIS — M84429A Pathological fracture, unspecified humerus, initial encounter for fracture: Secondary | ICD-10-CM | POA: Diagnosis not present

## 2013-10-15 DIAGNOSIS — M84429A Pathological fracture, unspecified humerus, initial encounter for fracture: Secondary | ICD-10-CM | POA: Diagnosis not present

## 2013-10-18 DIAGNOSIS — I509 Heart failure, unspecified: Secondary | ICD-10-CM | POA: Diagnosis not present

## 2013-10-18 DIAGNOSIS — E1169 Type 2 diabetes mellitus with other specified complication: Secondary | ICD-10-CM | POA: Diagnosis not present

## 2013-10-18 DIAGNOSIS — L02419 Cutaneous abscess of limb, unspecified: Secondary | ICD-10-CM | POA: Diagnosis not present

## 2013-10-18 DIAGNOSIS — M7989 Other specified soft tissue disorders: Secondary | ICD-10-CM | POA: Diagnosis not present

## 2013-10-18 DIAGNOSIS — Z794 Long term (current) use of insulin: Secondary | ICD-10-CM | POA: Diagnosis not present

## 2013-10-18 DIAGNOSIS — M79609 Pain in unspecified limb: Secondary | ICD-10-CM | POA: Diagnosis not present

## 2013-10-18 DIAGNOSIS — K219 Gastro-esophageal reflux disease without esophagitis: Secondary | ICD-10-CM | POA: Diagnosis not present

## 2013-10-18 DIAGNOSIS — E7800 Pure hypercholesterolemia, unspecified: Secondary | ICD-10-CM | POA: Diagnosis not present

## 2013-10-18 DIAGNOSIS — Z79899 Other long term (current) drug therapy: Secondary | ICD-10-CM | POA: Diagnosis not present

## 2013-10-18 DIAGNOSIS — L97509 Non-pressure chronic ulcer of other part of unspecified foot with unspecified severity: Secondary | ICD-10-CM | POA: Diagnosis not present

## 2013-10-18 DIAGNOSIS — I10 Essential (primary) hypertension: Secondary | ICD-10-CM | POA: Diagnosis not present

## 2013-10-20 DIAGNOSIS — M84429A Pathological fracture, unspecified humerus, initial encounter for fracture: Secondary | ICD-10-CM | POA: Diagnosis not present

## 2013-10-22 DIAGNOSIS — S42213A Unspecified displaced fracture of surgical neck of unspecified humerus, initial encounter for closed fracture: Secondary | ICD-10-CM | POA: Diagnosis not present

## 2013-10-22 DIAGNOSIS — M84429A Pathological fracture, unspecified humerus, initial encounter for fracture: Secondary | ICD-10-CM | POA: Diagnosis not present

## 2013-10-27 DIAGNOSIS — M84429A Pathological fracture, unspecified humerus, initial encounter for fracture: Secondary | ICD-10-CM | POA: Diagnosis not present

## 2013-10-29 DIAGNOSIS — M84429A Pathological fracture, unspecified humerus, initial encounter for fracture: Secondary | ICD-10-CM | POA: Diagnosis not present

## 2013-11-03 DIAGNOSIS — E1049 Type 1 diabetes mellitus with other diabetic neurological complication: Secondary | ICD-10-CM | POA: Diagnosis not present

## 2013-11-03 DIAGNOSIS — M84429A Pathological fracture, unspecified humerus, initial encounter for fracture: Secondary | ICD-10-CM | POA: Diagnosis not present

## 2013-11-03 DIAGNOSIS — S98919A Complete traumatic amputation of unspecified foot, level unspecified, initial encounter: Secondary | ICD-10-CM | POA: Diagnosis not present

## 2013-11-03 DIAGNOSIS — E1142 Type 2 diabetes mellitus with diabetic polyneuropathy: Secondary | ICD-10-CM | POA: Diagnosis not present

## 2013-11-03 DIAGNOSIS — L97509 Non-pressure chronic ulcer of other part of unspecified foot with unspecified severity: Secondary | ICD-10-CM | POA: Diagnosis not present

## 2013-11-05 DIAGNOSIS — M84429A Pathological fracture, unspecified humerus, initial encounter for fracture: Secondary | ICD-10-CM | POA: Diagnosis not present

## 2013-11-10 DIAGNOSIS — E1049 Type 1 diabetes mellitus with other diabetic neurological complication: Secondary | ICD-10-CM | POA: Diagnosis not present

## 2013-11-10 DIAGNOSIS — L97509 Non-pressure chronic ulcer of other part of unspecified foot with unspecified severity: Secondary | ICD-10-CM | POA: Diagnosis not present

## 2013-11-10 DIAGNOSIS — E1142 Type 2 diabetes mellitus with diabetic polyneuropathy: Secondary | ICD-10-CM | POA: Diagnosis not present

## 2013-11-10 DIAGNOSIS — S98919A Complete traumatic amputation of unspecified foot, level unspecified, initial encounter: Secondary | ICD-10-CM | POA: Diagnosis not present

## 2013-11-12 DIAGNOSIS — M84429A Pathological fracture, unspecified humerus, initial encounter for fracture: Secondary | ICD-10-CM | POA: Diagnosis not present

## 2013-11-17 DIAGNOSIS — E1049 Type 1 diabetes mellitus with other diabetic neurological complication: Secondary | ICD-10-CM | POA: Diagnosis not present

## 2013-11-17 DIAGNOSIS — L97509 Non-pressure chronic ulcer of other part of unspecified foot with unspecified severity: Secondary | ICD-10-CM | POA: Diagnosis not present

## 2013-11-17 DIAGNOSIS — E1142 Type 2 diabetes mellitus with diabetic polyneuropathy: Secondary | ICD-10-CM | POA: Diagnosis not present

## 2013-11-17 DIAGNOSIS — S98919A Complete traumatic amputation of unspecified foot, level unspecified, initial encounter: Secondary | ICD-10-CM | POA: Diagnosis not present

## 2013-11-19 DIAGNOSIS — M84429A Pathological fracture, unspecified humerus, initial encounter for fracture: Secondary | ICD-10-CM | POA: Diagnosis not present

## 2013-11-30 DIAGNOSIS — S42213A Unspecified displaced fracture of surgical neck of unspecified humerus, initial encounter for closed fracture: Secondary | ICD-10-CM | POA: Diagnosis not present

## 2013-12-01 DIAGNOSIS — E1142 Type 2 diabetes mellitus with diabetic polyneuropathy: Secondary | ICD-10-CM | POA: Diagnosis not present

## 2013-12-01 DIAGNOSIS — E1049 Type 1 diabetes mellitus with other diabetic neurological complication: Secondary | ICD-10-CM | POA: Diagnosis not present

## 2013-12-01 DIAGNOSIS — S98919A Complete traumatic amputation of unspecified foot, level unspecified, initial encounter: Secondary | ICD-10-CM | POA: Diagnosis not present

## 2013-12-01 DIAGNOSIS — M84429A Pathological fracture, unspecified humerus, initial encounter for fracture: Secondary | ICD-10-CM | POA: Diagnosis not present

## 2013-12-01 DIAGNOSIS — L97509 Non-pressure chronic ulcer of other part of unspecified foot with unspecified severity: Secondary | ICD-10-CM | POA: Diagnosis not present

## 2013-12-03 DIAGNOSIS — M84429A Pathological fracture, unspecified humerus, initial encounter for fracture: Secondary | ICD-10-CM | POA: Diagnosis not present

## 2013-12-08 DIAGNOSIS — M84429A Pathological fracture, unspecified humerus, initial encounter for fracture: Secondary | ICD-10-CM | POA: Diagnosis not present

## 2013-12-10 DIAGNOSIS — E11359 Type 2 diabetes mellitus with proliferative diabetic retinopathy without macular edema: Secondary | ICD-10-CM | POA: Diagnosis not present

## 2013-12-10 DIAGNOSIS — E119 Type 2 diabetes mellitus without complications: Secondary | ICD-10-CM | POA: Diagnosis not present

## 2013-12-15 DIAGNOSIS — M84429A Pathological fracture, unspecified humerus, initial encounter for fracture: Secondary | ICD-10-CM | POA: Diagnosis not present

## 2013-12-17 DIAGNOSIS — M84429A Pathological fracture, unspecified humerus, initial encounter for fracture: Secondary | ICD-10-CM | POA: Diagnosis not present

## 2013-12-22 DIAGNOSIS — L97509 Non-pressure chronic ulcer of other part of unspecified foot with unspecified severity: Secondary | ICD-10-CM | POA: Diagnosis not present

## 2013-12-22 DIAGNOSIS — S98919A Complete traumatic amputation of unspecified foot, level unspecified, initial encounter: Secondary | ICD-10-CM | POA: Diagnosis not present

## 2013-12-22 DIAGNOSIS — E1142 Type 2 diabetes mellitus with diabetic polyneuropathy: Secondary | ICD-10-CM | POA: Diagnosis not present

## 2013-12-22 DIAGNOSIS — E1049 Type 1 diabetes mellitus with other diabetic neurological complication: Secondary | ICD-10-CM | POA: Diagnosis not present

## 2013-12-23 DIAGNOSIS — R51 Headache: Secondary | ICD-10-CM | POA: Diagnosis not present

## 2013-12-23 DIAGNOSIS — J9819 Other pulmonary collapse: Secondary | ICD-10-CM | POA: Diagnosis not present

## 2013-12-23 DIAGNOSIS — R55 Syncope and collapse: Secondary | ICD-10-CM | POA: Diagnosis not present

## 2013-12-23 DIAGNOSIS — N390 Urinary tract infection, site not specified: Secondary | ICD-10-CM | POA: Diagnosis not present

## 2013-12-23 DIAGNOSIS — E860 Dehydration: Secondary | ICD-10-CM | POA: Diagnosis not present

## 2013-12-23 DIAGNOSIS — R5381 Other malaise: Secondary | ICD-10-CM | POA: Diagnosis not present

## 2013-12-23 DIAGNOSIS — R5383 Other fatigue: Secondary | ICD-10-CM | POA: Diagnosis not present

## 2013-12-25 DIAGNOSIS — M84429A Pathological fracture, unspecified humerus, initial encounter for fracture: Secondary | ICD-10-CM | POA: Diagnosis not present

## 2013-12-29 DIAGNOSIS — G5600 Carpal tunnel syndrome, unspecified upper limb: Secondary | ICD-10-CM | POA: Diagnosis not present

## 2013-12-30 DIAGNOSIS — E669 Obesity, unspecified: Secondary | ICD-10-CM | POA: Diagnosis not present

## 2013-12-30 DIAGNOSIS — E1065 Type 1 diabetes mellitus with hyperglycemia: Secondary | ICD-10-CM | POA: Diagnosis not present

## 2013-12-30 DIAGNOSIS — Z01810 Encounter for preprocedural cardiovascular examination: Secondary | ICD-10-CM | POA: Diagnosis not present

## 2013-12-30 DIAGNOSIS — IMO0002 Reserved for concepts with insufficient information to code with codable children: Secondary | ICD-10-CM | POA: Diagnosis not present

## 2013-12-30 DIAGNOSIS — I2510 Atherosclerotic heart disease of native coronary artery without angina pectoris: Secondary | ICD-10-CM | POA: Diagnosis not present

## 2013-12-30 DIAGNOSIS — I10 Essential (primary) hypertension: Secondary | ICD-10-CM | POA: Diagnosis not present

## 2013-12-30 DIAGNOSIS — E785 Hyperlipidemia, unspecified: Secondary | ICD-10-CM | POA: Diagnosis not present

## 2014-01-05 DIAGNOSIS — E1142 Type 2 diabetes mellitus with diabetic polyneuropathy: Secondary | ICD-10-CM | POA: Diagnosis not present

## 2014-01-05 DIAGNOSIS — I2510 Atherosclerotic heart disease of native coronary artery without angina pectoris: Secondary | ICD-10-CM | POA: Diagnosis not present

## 2014-01-05 DIAGNOSIS — Z01810 Encounter for preprocedural cardiovascular examination: Secondary | ICD-10-CM | POA: Diagnosis not present

## 2014-01-05 DIAGNOSIS — E1049 Type 1 diabetes mellitus with other diabetic neurological complication: Secondary | ICD-10-CM | POA: Diagnosis not present

## 2014-01-05 DIAGNOSIS — L97509 Non-pressure chronic ulcer of other part of unspecified foot with unspecified severity: Secondary | ICD-10-CM | POA: Diagnosis not present

## 2014-01-07 DIAGNOSIS — M2040 Other hammer toe(s) (acquired), unspecified foot: Secondary | ICD-10-CM | POA: Diagnosis not present

## 2014-01-07 DIAGNOSIS — I10 Essential (primary) hypertension: Secondary | ICD-10-CM | POA: Diagnosis not present

## 2014-01-07 DIAGNOSIS — E785 Hyperlipidemia, unspecified: Secondary | ICD-10-CM | POA: Diagnosis not present

## 2014-01-07 DIAGNOSIS — L03039 Cellulitis of unspecified toe: Secondary | ICD-10-CM | POA: Diagnosis not present

## 2014-01-07 DIAGNOSIS — E669 Obesity, unspecified: Secondary | ICD-10-CM | POA: Diagnosis not present

## 2014-01-07 DIAGNOSIS — I739 Peripheral vascular disease, unspecified: Secondary | ICD-10-CM | POA: Diagnosis not present

## 2014-01-07 DIAGNOSIS — E119 Type 2 diabetes mellitus without complications: Secondary | ICD-10-CM | POA: Diagnosis not present

## 2014-01-07 DIAGNOSIS — L600 Ingrowing nail: Secondary | ICD-10-CM | POA: Diagnosis not present

## 2014-01-07 DIAGNOSIS — Z79899 Other long term (current) drug therapy: Secondary | ICD-10-CM | POA: Diagnosis not present

## 2014-01-07 DIAGNOSIS — L97509 Non-pressure chronic ulcer of other part of unspecified foot with unspecified severity: Secondary | ICD-10-CM | POA: Diagnosis not present

## 2014-01-27 DIAGNOSIS — M79609 Pain in unspecified limb: Secondary | ICD-10-CM | POA: Diagnosis not present

## 2014-01-27 DIAGNOSIS — G609 Hereditary and idiopathic neuropathy, unspecified: Secondary | ICD-10-CM | POA: Diagnosis not present

## 2014-01-27 DIAGNOSIS — Z1231 Encounter for screening mammogram for malignant neoplasm of breast: Secondary | ICD-10-CM | POA: Diagnosis not present

## 2014-02-10 DIAGNOSIS — K3184 Gastroparesis: Secondary | ICD-10-CM | POA: Diagnosis not present

## 2014-02-10 DIAGNOSIS — K5900 Constipation, unspecified: Secondary | ICD-10-CM | POA: Diagnosis not present

## 2014-02-10 DIAGNOSIS — R112 Nausea with vomiting, unspecified: Secondary | ICD-10-CM | POA: Diagnosis not present

## 2014-02-10 DIAGNOSIS — E1149 Type 2 diabetes mellitus with other diabetic neurological complication: Secondary | ICD-10-CM | POA: Diagnosis not present

## 2014-02-22 DIAGNOSIS — S59909A Unspecified injury of unspecified elbow, initial encounter: Secondary | ICD-10-CM | POA: Diagnosis not present

## 2014-02-22 DIAGNOSIS — S8990XA Unspecified injury of unspecified lower leg, initial encounter: Secondary | ICD-10-CM | POA: Diagnosis not present

## 2014-02-23 DIAGNOSIS — S90129A Contusion of unspecified lesser toe(s) without damage to nail, initial encounter: Secondary | ICD-10-CM | POA: Diagnosis not present

## 2014-02-23 DIAGNOSIS — S93409A Sprain of unspecified ligament of unspecified ankle, initial encounter: Secondary | ICD-10-CM | POA: Diagnosis not present

## 2014-02-24 DIAGNOSIS — G5600 Carpal tunnel syndrome, unspecified upper limb: Secondary | ICD-10-CM | POA: Diagnosis not present

## 2014-03-02 DIAGNOSIS — S91309A Unspecified open wound, unspecified foot, initial encounter: Secondary | ICD-10-CM | POA: Diagnosis not present

## 2014-03-04 DIAGNOSIS — R209 Unspecified disturbances of skin sensation: Secondary | ICD-10-CM | POA: Diagnosis not present

## 2014-03-09 DIAGNOSIS — E038 Other specified hypothyroidism: Secondary | ICD-10-CM | POA: Diagnosis not present

## 2014-03-09 DIAGNOSIS — R4789 Other speech disturbances: Secondary | ICD-10-CM | POA: Diagnosis not present

## 2014-03-09 DIAGNOSIS — M47817 Spondylosis without myelopathy or radiculopathy, lumbosacral region: Secondary | ICD-10-CM | POA: Diagnosis not present

## 2014-03-09 DIAGNOSIS — S42213A Unspecified displaced fracture of surgical neck of unspecified humerus, initial encounter for closed fracture: Secondary | ICD-10-CM | POA: Diagnosis not present

## 2014-03-09 DIAGNOSIS — E1149 Type 2 diabetes mellitus with other diabetic neurological complication: Secondary | ICD-10-CM | POA: Diagnosis not present

## 2014-03-09 DIAGNOSIS — E7800 Pure hypercholesterolemia, unspecified: Secondary | ICD-10-CM | POA: Diagnosis not present

## 2014-03-09 DIAGNOSIS — W010XXA Fall on same level from slipping, tripping and stumbling without subsequent striking against object, initial encounter: Secondary | ICD-10-CM | POA: Diagnosis not present

## 2014-03-09 DIAGNOSIS — I10 Essential (primary) hypertension: Secondary | ICD-10-CM | POA: Diagnosis not present

## 2014-03-09 DIAGNOSIS — E1142 Type 2 diabetes mellitus with diabetic polyneuropathy: Secondary | ICD-10-CM | POA: Diagnosis not present

## 2014-03-09 DIAGNOSIS — IMO0001 Reserved for inherently not codable concepts without codable children: Secondary | ICD-10-CM | POA: Diagnosis not present

## 2014-04-01 DIAGNOSIS — E1142 Type 2 diabetes mellitus with diabetic polyneuropathy: Secondary | ICD-10-CM | POA: Diagnosis not present

## 2014-04-01 DIAGNOSIS — E1049 Type 1 diabetes mellitus with other diabetic neurological complication: Secondary | ICD-10-CM | POA: Diagnosis not present

## 2014-04-01 DIAGNOSIS — L97509 Non-pressure chronic ulcer of other part of unspecified foot with unspecified severity: Secondary | ICD-10-CM | POA: Diagnosis not present

## 2014-04-01 DIAGNOSIS — L03039 Cellulitis of unspecified toe: Secondary | ICD-10-CM | POA: Diagnosis not present

## 2014-04-01 DIAGNOSIS — L02619 Cutaneous abscess of unspecified foot: Secondary | ICD-10-CM | POA: Diagnosis not present

## 2014-04-02 DIAGNOSIS — E1142 Type 2 diabetes mellitus with diabetic polyneuropathy: Secondary | ICD-10-CM | POA: Insufficient documentation

## 2014-04-02 DIAGNOSIS — E1149 Type 2 diabetes mellitus with other diabetic neurological complication: Secondary | ICD-10-CM | POA: Diagnosis not present

## 2014-04-02 DIAGNOSIS — R269 Unspecified abnormalities of gait and mobility: Secondary | ICD-10-CM | POA: Diagnosis not present

## 2014-04-02 DIAGNOSIS — R4701 Aphasia: Secondary | ICD-10-CM | POA: Diagnosis not present

## 2014-04-06 DIAGNOSIS — R259 Unspecified abnormal involuntary movements: Secondary | ICD-10-CM | POA: Diagnosis not present

## 2014-04-06 DIAGNOSIS — J323 Chronic sphenoidal sinusitis: Secondary | ICD-10-CM | POA: Diagnosis not present

## 2014-04-06 DIAGNOSIS — R4789 Other speech disturbances: Secondary | ICD-10-CM | POA: Diagnosis not present

## 2014-04-06 DIAGNOSIS — R269 Unspecified abnormalities of gait and mobility: Secondary | ICD-10-CM | POA: Diagnosis not present

## 2014-04-06 DIAGNOSIS — R4701 Aphasia: Secondary | ICD-10-CM | POA: Diagnosis not present

## 2014-04-08 DIAGNOSIS — L97509 Non-pressure chronic ulcer of other part of unspecified foot with unspecified severity: Secondary | ICD-10-CM | POA: Diagnosis not present

## 2014-04-08 DIAGNOSIS — E1142 Type 2 diabetes mellitus with diabetic polyneuropathy: Secondary | ICD-10-CM | POA: Diagnosis not present

## 2014-04-08 DIAGNOSIS — S98919A Complete traumatic amputation of unspecified foot, level unspecified, initial encounter: Secondary | ICD-10-CM | POA: Diagnosis not present

## 2014-04-08 DIAGNOSIS — E1049 Type 1 diabetes mellitus with other diabetic neurological complication: Secondary | ICD-10-CM | POA: Diagnosis not present

## 2014-04-12 DIAGNOSIS — M549 Dorsalgia, unspecified: Secondary | ICD-10-CM | POA: Diagnosis not present

## 2014-04-12 DIAGNOSIS — R4701 Aphasia: Secondary | ICD-10-CM | POA: Diagnosis not present

## 2014-04-12 DIAGNOSIS — E119 Type 2 diabetes mellitus without complications: Secondary | ICD-10-CM | POA: Diagnosis not present

## 2014-04-12 DIAGNOSIS — G43009 Migraine without aura, not intractable, without status migrainosus: Secondary | ICD-10-CM | POA: Diagnosis not present

## 2014-04-12 DIAGNOSIS — E1149 Type 2 diabetes mellitus with other diabetic neurological complication: Secondary | ICD-10-CM | POA: Diagnosis not present

## 2014-04-12 DIAGNOSIS — R269 Unspecified abnormalities of gait and mobility: Secondary | ICD-10-CM | POA: Diagnosis not present

## 2014-04-21 DIAGNOSIS — L97519 Non-pressure chronic ulcer of other part of right foot with unspecified severity: Secondary | ICD-10-CM | POA: Diagnosis not present

## 2014-04-21 DIAGNOSIS — L97509 Non-pressure chronic ulcer of other part of unspecified foot with unspecified severity: Secondary | ICD-10-CM | POA: Diagnosis not present

## 2014-04-21 DIAGNOSIS — Z89432 Acquired absence of left foot: Secondary | ICD-10-CM | POA: Diagnosis not present

## 2014-04-21 DIAGNOSIS — L03039 Cellulitis of unspecified toe: Secondary | ICD-10-CM | POA: Diagnosis not present

## 2014-04-21 DIAGNOSIS — E1065 Type 1 diabetes mellitus with hyperglycemia: Secondary | ICD-10-CM | POA: Diagnosis not present

## 2014-04-22 DIAGNOSIS — Z1211 Encounter for screening for malignant neoplasm of colon: Secondary | ICD-10-CM | POA: Diagnosis not present

## 2014-04-22 DIAGNOSIS — S91201D Unspecified open wound of right great toe with damage to nail, subsequent encounter: Secondary | ICD-10-CM | POA: Diagnosis not present

## 2014-04-22 DIAGNOSIS — Z0001 Encounter for general adult medical examination with abnormal findings: Secondary | ICD-10-CM | POA: Diagnosis not present

## 2014-04-22 DIAGNOSIS — Z23 Encounter for immunization: Secondary | ICD-10-CM | POA: Diagnosis not present

## 2014-04-27 DIAGNOSIS — L03039 Cellulitis of unspecified toe: Secondary | ICD-10-CM | POA: Diagnosis not present

## 2014-04-27 DIAGNOSIS — E119 Type 2 diabetes mellitus without complications: Secondary | ICD-10-CM | POA: Diagnosis not present

## 2014-04-27 DIAGNOSIS — L97509 Non-pressure chronic ulcer of other part of unspecified foot with unspecified severity: Secondary | ICD-10-CM | POA: Diagnosis not present

## 2014-04-28 DIAGNOSIS — I10 Essential (primary) hypertension: Secondary | ICD-10-CM | POA: Diagnosis not present

## 2014-04-28 DIAGNOSIS — E10621 Type 1 diabetes mellitus with foot ulcer: Secondary | ICD-10-CM | POA: Diagnosis not present

## 2014-04-28 DIAGNOSIS — L97519 Non-pressure chronic ulcer of other part of right foot with unspecified severity: Secondary | ICD-10-CM | POA: Diagnosis not present

## 2014-04-28 DIAGNOSIS — Z794 Long term (current) use of insulin: Secondary | ICD-10-CM | POA: Diagnosis not present

## 2014-04-28 DIAGNOSIS — Z89412 Acquired absence of left great toe: Secondary | ICD-10-CM | POA: Diagnosis not present

## 2014-04-28 DIAGNOSIS — L97512 Non-pressure chronic ulcer of other part of right foot with fat layer exposed: Secondary | ICD-10-CM | POA: Diagnosis not present

## 2014-04-28 DIAGNOSIS — J449 Chronic obstructive pulmonary disease, unspecified: Secondary | ICD-10-CM | POA: Diagnosis not present

## 2014-05-03 DIAGNOSIS — E10621 Type 1 diabetes mellitus with foot ulcer: Secondary | ICD-10-CM | POA: Diagnosis not present

## 2014-05-03 DIAGNOSIS — M7989 Other specified soft tissue disorders: Secondary | ICD-10-CM | POA: Diagnosis not present

## 2014-05-03 DIAGNOSIS — E119 Type 2 diabetes mellitus without complications: Secondary | ICD-10-CM | POA: Diagnosis not present

## 2014-05-03 DIAGNOSIS — N1371 Vesicoureteral-reflux without reflux nephropathy: Secondary | ICD-10-CM | POA: Diagnosis not present

## 2014-05-04 DIAGNOSIS — R609 Edema, unspecified: Secondary | ICD-10-CM | POA: Diagnosis not present

## 2014-05-04 DIAGNOSIS — M869 Osteomyelitis, unspecified: Secondary | ICD-10-CM | POA: Diagnosis not present

## 2014-05-04 DIAGNOSIS — L089 Local infection of the skin and subcutaneous tissue, unspecified: Secondary | ICD-10-CM | POA: Diagnosis not present

## 2014-05-05 DIAGNOSIS — Z794 Long term (current) use of insulin: Secondary | ICD-10-CM | POA: Diagnosis not present

## 2014-05-05 DIAGNOSIS — J449 Chronic obstructive pulmonary disease, unspecified: Secondary | ICD-10-CM | POA: Diagnosis not present

## 2014-05-05 DIAGNOSIS — I10 Essential (primary) hypertension: Secondary | ICD-10-CM | POA: Diagnosis not present

## 2014-05-05 DIAGNOSIS — L97519 Non-pressure chronic ulcer of other part of right foot with unspecified severity: Secondary | ICD-10-CM | POA: Diagnosis not present

## 2014-05-05 DIAGNOSIS — L97512 Non-pressure chronic ulcer of other part of right foot with fat layer exposed: Secondary | ICD-10-CM | POA: Diagnosis not present

## 2014-05-05 DIAGNOSIS — R0602 Shortness of breath: Secondary | ICD-10-CM | POA: Diagnosis not present

## 2014-05-05 DIAGNOSIS — E10621 Type 1 diabetes mellitus with foot ulcer: Secondary | ICD-10-CM | POA: Diagnosis not present

## 2014-05-05 DIAGNOSIS — I87301 Chronic venous hypertension (idiopathic) without complications of right lower extremity: Secondary | ICD-10-CM | POA: Diagnosis not present

## 2014-05-05 DIAGNOSIS — Z89412 Acquired absence of left great toe: Secondary | ICD-10-CM | POA: Diagnosis not present

## 2014-05-11 DIAGNOSIS — M79652 Pain in left thigh: Secondary | ICD-10-CM | POA: Diagnosis not present

## 2014-05-11 DIAGNOSIS — L97519 Non-pressure chronic ulcer of other part of right foot with unspecified severity: Secondary | ICD-10-CM | POA: Diagnosis not present

## 2014-05-11 DIAGNOSIS — I87301 Chronic venous hypertension (idiopathic) without complications of right lower extremity: Secondary | ICD-10-CM | POA: Diagnosis not present

## 2014-05-11 DIAGNOSIS — R937 Abnormal findings on diagnostic imaging of other parts of musculoskeletal system: Secondary | ICD-10-CM | POA: Diagnosis not present

## 2014-05-11 DIAGNOSIS — M79651 Pain in right thigh: Secondary | ICD-10-CM | POA: Diagnosis not present

## 2014-05-12 DIAGNOSIS — E10621 Type 1 diabetes mellitus with foot ulcer: Secondary | ICD-10-CM | POA: Diagnosis not present

## 2014-05-12 DIAGNOSIS — J449 Chronic obstructive pulmonary disease, unspecified: Secondary | ICD-10-CM | POA: Diagnosis not present

## 2014-05-12 DIAGNOSIS — L97512 Non-pressure chronic ulcer of other part of right foot with fat layer exposed: Secondary | ICD-10-CM | POA: Diagnosis not present

## 2014-05-12 DIAGNOSIS — L97519 Non-pressure chronic ulcer of other part of right foot with unspecified severity: Secondary | ICD-10-CM | POA: Diagnosis not present

## 2014-05-12 DIAGNOSIS — Z89412 Acquired absence of left great toe: Secondary | ICD-10-CM | POA: Diagnosis not present

## 2014-05-19 DIAGNOSIS — J449 Chronic obstructive pulmonary disease, unspecified: Secondary | ICD-10-CM | POA: Diagnosis not present

## 2014-05-19 DIAGNOSIS — I87301 Chronic venous hypertension (idiopathic) without complications of right lower extremity: Secondary | ICD-10-CM | POA: Diagnosis not present

## 2014-05-19 DIAGNOSIS — L97514 Non-pressure chronic ulcer of other part of right foot with necrosis of bone: Secondary | ICD-10-CM | POA: Diagnosis not present

## 2014-05-19 DIAGNOSIS — E10621 Type 1 diabetes mellitus with foot ulcer: Secondary | ICD-10-CM | POA: Diagnosis not present

## 2014-05-19 DIAGNOSIS — M86071 Acute hematogenous osteomyelitis, right ankle and foot: Secondary | ICD-10-CM | POA: Diagnosis not present

## 2014-05-19 DIAGNOSIS — Z89412 Acquired absence of left great toe: Secondary | ICD-10-CM | POA: Diagnosis not present

## 2014-05-26 DIAGNOSIS — J449 Chronic obstructive pulmonary disease, unspecified: Secondary | ICD-10-CM | POA: Diagnosis not present

## 2014-05-26 DIAGNOSIS — Z89411 Acquired absence of right great toe: Secondary | ICD-10-CM | POA: Diagnosis not present

## 2014-05-26 DIAGNOSIS — I87301 Chronic venous hypertension (idiopathic) without complications of right lower extremity: Secondary | ICD-10-CM | POA: Diagnosis not present

## 2014-05-26 DIAGNOSIS — L97519 Non-pressure chronic ulcer of other part of right foot with unspecified severity: Secondary | ICD-10-CM | POA: Diagnosis not present

## 2014-05-26 DIAGNOSIS — L97514 Non-pressure chronic ulcer of other part of right foot with necrosis of bone: Secondary | ICD-10-CM | POA: Diagnosis not present

## 2014-05-26 DIAGNOSIS — M86071 Acute hematogenous osteomyelitis, right ankle and foot: Secondary | ICD-10-CM | POA: Diagnosis not present

## 2014-05-26 DIAGNOSIS — E10621 Type 1 diabetes mellitus with foot ulcer: Secondary | ICD-10-CM | POA: Diagnosis not present

## 2014-06-02 DIAGNOSIS — E10621 Type 1 diabetes mellitus with foot ulcer: Secondary | ICD-10-CM | POA: Diagnosis not present

## 2014-06-02 DIAGNOSIS — L97514 Non-pressure chronic ulcer of other part of right foot with necrosis of bone: Secondary | ICD-10-CM | POA: Diagnosis not present

## 2014-06-02 DIAGNOSIS — L97519 Non-pressure chronic ulcer of other part of right foot with unspecified severity: Secondary | ICD-10-CM | POA: Diagnosis not present

## 2014-06-02 DIAGNOSIS — Z89411 Acquired absence of right great toe: Secondary | ICD-10-CM | POA: Diagnosis not present

## 2014-06-02 DIAGNOSIS — M86071 Acute hematogenous osteomyelitis, right ankle and foot: Secondary | ICD-10-CM | POA: Diagnosis not present

## 2014-06-07 DIAGNOSIS — L97514 Non-pressure chronic ulcer of other part of right foot with necrosis of bone: Secondary | ICD-10-CM | POA: Diagnosis not present

## 2014-06-07 DIAGNOSIS — M86171 Other acute osteomyelitis, right ankle and foot: Secondary | ICD-10-CM | POA: Diagnosis not present

## 2014-06-07 DIAGNOSIS — M86671 Other chronic osteomyelitis, right ankle and foot: Secondary | ICD-10-CM | POA: Diagnosis not present

## 2014-06-07 DIAGNOSIS — E10621 Type 1 diabetes mellitus with foot ulcer: Secondary | ICD-10-CM | POA: Diagnosis not present

## 2014-06-08 DIAGNOSIS — L97514 Non-pressure chronic ulcer of other part of right foot with necrosis of bone: Secondary | ICD-10-CM | POA: Diagnosis not present

## 2014-06-08 DIAGNOSIS — M86671 Other chronic osteomyelitis, right ankle and foot: Secondary | ICD-10-CM | POA: Diagnosis not present

## 2014-06-08 DIAGNOSIS — E10621 Type 1 diabetes mellitus with foot ulcer: Secondary | ICD-10-CM | POA: Diagnosis not present

## 2014-06-08 DIAGNOSIS — M86171 Other acute osteomyelitis, right ankle and foot: Secondary | ICD-10-CM | POA: Diagnosis not present

## 2014-06-09 DIAGNOSIS — E10621 Type 1 diabetes mellitus with foot ulcer: Secondary | ICD-10-CM | POA: Diagnosis not present

## 2014-06-09 DIAGNOSIS — L97514 Non-pressure chronic ulcer of other part of right foot with necrosis of bone: Secondary | ICD-10-CM | POA: Diagnosis not present

## 2014-06-09 DIAGNOSIS — M86671 Other chronic osteomyelitis, right ankle and foot: Secondary | ICD-10-CM | POA: Diagnosis not present

## 2014-06-09 DIAGNOSIS — M86171 Other acute osteomyelitis, right ankle and foot: Secondary | ICD-10-CM | POA: Diagnosis not present

## 2014-06-11 DIAGNOSIS — E10621 Type 1 diabetes mellitus with foot ulcer: Secondary | ICD-10-CM | POA: Diagnosis not present

## 2014-06-11 DIAGNOSIS — M86171 Other acute osteomyelitis, right ankle and foot: Secondary | ICD-10-CM | POA: Diagnosis not present

## 2014-06-11 DIAGNOSIS — M86671 Other chronic osteomyelitis, right ankle and foot: Secondary | ICD-10-CM | POA: Diagnosis not present

## 2014-06-11 DIAGNOSIS — L97514 Non-pressure chronic ulcer of other part of right foot with necrosis of bone: Secondary | ICD-10-CM | POA: Diagnosis not present

## 2014-06-14 DIAGNOSIS — W1849XA Other slipping, tripping and stumbling without falling, initial encounter: Secondary | ICD-10-CM | POA: Diagnosis not present

## 2014-06-14 DIAGNOSIS — E1142 Type 2 diabetes mellitus with diabetic polyneuropathy: Secondary | ICD-10-CM | POA: Diagnosis not present

## 2014-06-14 DIAGNOSIS — M86171 Other acute osteomyelitis, right ankle and foot: Secondary | ICD-10-CM | POA: Diagnosis not present

## 2014-06-14 DIAGNOSIS — E1165 Type 2 diabetes mellitus with hyperglycemia: Secondary | ICD-10-CM | POA: Diagnosis not present

## 2014-06-14 DIAGNOSIS — E10621 Type 1 diabetes mellitus with foot ulcer: Secondary | ICD-10-CM | POA: Diagnosis not present

## 2014-06-14 DIAGNOSIS — W1839XD Other fall on same level, subsequent encounter: Secondary | ICD-10-CM | POA: Diagnosis not present

## 2014-06-14 DIAGNOSIS — I10 Essential (primary) hypertension: Secondary | ICD-10-CM | POA: Diagnosis not present

## 2014-06-14 DIAGNOSIS — M545 Low back pain: Secondary | ICD-10-CM | POA: Diagnosis not present

## 2014-06-14 DIAGNOSIS — Z23 Encounter for immunization: Secondary | ICD-10-CM | POA: Diagnosis not present

## 2014-06-14 DIAGNOSIS — R2681 Unsteadiness on feet: Secondary | ICD-10-CM | POA: Diagnosis not present

## 2014-06-14 DIAGNOSIS — M86671 Other chronic osteomyelitis, right ankle and foot: Secondary | ICD-10-CM | POA: Diagnosis not present

## 2014-06-14 DIAGNOSIS — E782 Mixed hyperlipidemia: Secondary | ICD-10-CM | POA: Diagnosis not present

## 2014-06-14 DIAGNOSIS — L97514 Non-pressure chronic ulcer of other part of right foot with necrosis of bone: Secondary | ICD-10-CM | POA: Diagnosis not present

## 2014-06-14 DIAGNOSIS — E780 Pure hypercholesterolemia: Secondary | ICD-10-CM | POA: Diagnosis not present

## 2014-06-15 DIAGNOSIS — M86671 Other chronic osteomyelitis, right ankle and foot: Secondary | ICD-10-CM | POA: Diagnosis not present

## 2014-06-15 DIAGNOSIS — E10621 Type 1 diabetes mellitus with foot ulcer: Secondary | ICD-10-CM | POA: Diagnosis not present

## 2014-06-15 DIAGNOSIS — L97514 Non-pressure chronic ulcer of other part of right foot with necrosis of bone: Secondary | ICD-10-CM | POA: Diagnosis not present

## 2014-06-15 DIAGNOSIS — M86171 Other acute osteomyelitis, right ankle and foot: Secondary | ICD-10-CM | POA: Diagnosis not present

## 2014-06-16 DIAGNOSIS — M86671 Other chronic osteomyelitis, right ankle and foot: Secondary | ICD-10-CM | POA: Diagnosis not present

## 2014-06-16 DIAGNOSIS — M86171 Other acute osteomyelitis, right ankle and foot: Secondary | ICD-10-CM | POA: Diagnosis not present

## 2014-06-16 DIAGNOSIS — L97514 Non-pressure chronic ulcer of other part of right foot with necrosis of bone: Secondary | ICD-10-CM | POA: Diagnosis not present

## 2014-06-16 DIAGNOSIS — E10621 Type 1 diabetes mellitus with foot ulcer: Secondary | ICD-10-CM | POA: Diagnosis not present

## 2014-06-17 DIAGNOSIS — E119 Type 2 diabetes mellitus without complications: Secondary | ICD-10-CM | POA: Diagnosis not present

## 2014-06-17 DIAGNOSIS — M5489 Other dorsalgia: Secondary | ICD-10-CM | POA: Diagnosis not present

## 2014-06-17 DIAGNOSIS — I10 Essential (primary) hypertension: Secondary | ICD-10-CM | POA: Diagnosis not present

## 2014-06-17 DIAGNOSIS — Z794 Long term (current) use of insulin: Secondary | ICD-10-CM | POA: Diagnosis not present

## 2014-06-17 DIAGNOSIS — E780 Pure hypercholesterolemia: Secondary | ICD-10-CM | POA: Diagnosis not present

## 2014-06-17 DIAGNOSIS — M86171 Other acute osteomyelitis, right ankle and foot: Secondary | ICD-10-CM | POA: Diagnosis not present

## 2014-06-17 DIAGNOSIS — R0789 Other chest pain: Secondary | ICD-10-CM | POA: Diagnosis not present

## 2014-06-17 DIAGNOSIS — M86671 Other chronic osteomyelitis, right ankle and foot: Secondary | ICD-10-CM | POA: Diagnosis not present

## 2014-06-17 DIAGNOSIS — R51 Headache: Secondary | ICD-10-CM | POA: Diagnosis not present

## 2014-06-17 DIAGNOSIS — R42 Dizziness and giddiness: Secondary | ICD-10-CM | POA: Diagnosis not present

## 2014-06-17 DIAGNOSIS — L97514 Non-pressure chronic ulcer of other part of right foot with necrosis of bone: Secondary | ICD-10-CM | POA: Diagnosis not present

## 2014-06-17 DIAGNOSIS — M549 Dorsalgia, unspecified: Secondary | ICD-10-CM | POA: Diagnosis not present

## 2014-06-17 DIAGNOSIS — R0602 Shortness of breath: Secondary | ICD-10-CM | POA: Diagnosis not present

## 2014-06-17 DIAGNOSIS — E10621 Type 1 diabetes mellitus with foot ulcer: Secondary | ICD-10-CM | POA: Diagnosis not present

## 2014-06-17 DIAGNOSIS — R030 Elevated blood-pressure reading, without diagnosis of hypertension: Secondary | ICD-10-CM | POA: Diagnosis not present

## 2014-06-18 DIAGNOSIS — R51 Headache: Secondary | ICD-10-CM | POA: Diagnosis not present

## 2014-06-18 DIAGNOSIS — M549 Dorsalgia, unspecified: Secondary | ICD-10-CM | POA: Diagnosis not present

## 2014-06-18 DIAGNOSIS — R030 Elevated blood-pressure reading, without diagnosis of hypertension: Secondary | ICD-10-CM | POA: Diagnosis not present

## 2014-06-18 DIAGNOSIS — R0602 Shortness of breath: Secondary | ICD-10-CM | POA: Diagnosis not present

## 2014-06-18 DIAGNOSIS — M5489 Other dorsalgia: Secondary | ICD-10-CM | POA: Diagnosis not present

## 2014-06-18 DIAGNOSIS — R42 Dizziness and giddiness: Secondary | ICD-10-CM | POA: Diagnosis not present

## 2014-06-21 DIAGNOSIS — I340 Nonrheumatic mitral (valve) insufficiency: Secondary | ICD-10-CM | POA: Diagnosis not present

## 2014-06-21 DIAGNOSIS — E10621 Type 1 diabetes mellitus with foot ulcer: Secondary | ICD-10-CM | POA: Diagnosis not present

## 2014-06-21 DIAGNOSIS — L97514 Non-pressure chronic ulcer of other part of right foot with necrosis of bone: Secondary | ICD-10-CM | POA: Diagnosis not present

## 2014-06-21 DIAGNOSIS — M86171 Other acute osteomyelitis, right ankle and foot: Secondary | ICD-10-CM | POA: Diagnosis not present

## 2014-06-21 DIAGNOSIS — I10 Essential (primary) hypertension: Secondary | ICD-10-CM | POA: Diagnosis not present

## 2014-06-21 DIAGNOSIS — R0602 Shortness of breath: Secondary | ICD-10-CM | POA: Diagnosis not present

## 2014-06-22 DIAGNOSIS — M86071 Acute hematogenous osteomyelitis, right ankle and foot: Secondary | ICD-10-CM | POA: Diagnosis not present

## 2014-06-22 DIAGNOSIS — E10621 Type 1 diabetes mellitus with foot ulcer: Secondary | ICD-10-CM | POA: Diagnosis not present

## 2014-06-22 DIAGNOSIS — M86171 Other acute osteomyelitis, right ankle and foot: Secondary | ICD-10-CM | POA: Diagnosis not present

## 2014-06-22 DIAGNOSIS — M86671 Other chronic osteomyelitis, right ankle and foot: Secondary | ICD-10-CM | POA: Diagnosis not present

## 2014-06-22 DIAGNOSIS — Z89412 Acquired absence of left great toe: Secondary | ICD-10-CM | POA: Diagnosis not present

## 2014-06-22 DIAGNOSIS — I87301 Chronic venous hypertension (idiopathic) without complications of right lower extremity: Secondary | ICD-10-CM | POA: Diagnosis not present

## 2014-06-22 DIAGNOSIS — L97514 Non-pressure chronic ulcer of other part of right foot with necrosis of bone: Secondary | ICD-10-CM | POA: Diagnosis not present

## 2014-06-22 DIAGNOSIS — J449 Chronic obstructive pulmonary disease, unspecified: Secondary | ICD-10-CM | POA: Diagnosis not present

## 2014-06-23 DIAGNOSIS — L97514 Non-pressure chronic ulcer of other part of right foot with necrosis of bone: Secondary | ICD-10-CM | POA: Diagnosis not present

## 2014-06-23 DIAGNOSIS — M86171 Other acute osteomyelitis, right ankle and foot: Secondary | ICD-10-CM | POA: Diagnosis not present

## 2014-06-23 DIAGNOSIS — E10621 Type 1 diabetes mellitus with foot ulcer: Secondary | ICD-10-CM | POA: Diagnosis not present

## 2014-06-23 DIAGNOSIS — M86671 Other chronic osteomyelitis, right ankle and foot: Secondary | ICD-10-CM | POA: Diagnosis not present

## 2014-06-24 DIAGNOSIS — L97514 Non-pressure chronic ulcer of other part of right foot with necrosis of bone: Secondary | ICD-10-CM | POA: Diagnosis not present

## 2014-06-24 DIAGNOSIS — M86671 Other chronic osteomyelitis, right ankle and foot: Secondary | ICD-10-CM | POA: Diagnosis not present

## 2014-06-24 DIAGNOSIS — E10621 Type 1 diabetes mellitus with foot ulcer: Secondary | ICD-10-CM | POA: Diagnosis not present

## 2014-06-24 DIAGNOSIS — M86171 Other acute osteomyelitis, right ankle and foot: Secondary | ICD-10-CM | POA: Diagnosis not present

## 2014-06-25 DIAGNOSIS — I517 Cardiomegaly: Secondary | ICD-10-CM | POA: Diagnosis not present

## 2014-06-25 DIAGNOSIS — E119 Type 2 diabetes mellitus without complications: Secondary | ICD-10-CM | POA: Diagnosis not present

## 2014-06-25 DIAGNOSIS — I10 Essential (primary) hypertension: Secondary | ICD-10-CM | POA: Diagnosis not present

## 2014-06-25 DIAGNOSIS — Z794 Long term (current) use of insulin: Secondary | ICD-10-CM | POA: Diagnosis not present

## 2014-06-25 DIAGNOSIS — R0602 Shortness of breath: Secondary | ICD-10-CM | POA: Diagnosis not present

## 2014-06-25 DIAGNOSIS — E785 Hyperlipidemia, unspecified: Secondary | ICD-10-CM | POA: Diagnosis not present

## 2014-06-25 DIAGNOSIS — R0789 Other chest pain: Secondary | ICD-10-CM | POA: Diagnosis not present

## 2014-06-25 DIAGNOSIS — R110 Nausea: Secondary | ICD-10-CM | POA: Diagnosis not present

## 2014-06-25 DIAGNOSIS — J449 Chronic obstructive pulmonary disease, unspecified: Secondary | ICD-10-CM | POA: Diagnosis not present

## 2014-06-25 DIAGNOSIS — R42 Dizziness and giddiness: Secondary | ICD-10-CM | POA: Diagnosis not present

## 2014-06-25 DIAGNOSIS — I509 Heart failure, unspecified: Secondary | ICD-10-CM | POA: Diagnosis not present

## 2014-06-25 DIAGNOSIS — R079 Chest pain, unspecified: Secondary | ICD-10-CM | POA: Diagnosis not present

## 2014-06-25 DIAGNOSIS — E780 Pure hypercholesterolemia: Secondary | ICD-10-CM | POA: Diagnosis not present

## 2014-06-28 DIAGNOSIS — R0789 Other chest pain: Secondary | ICD-10-CM | POA: Diagnosis not present

## 2014-06-28 DIAGNOSIS — I5031 Acute diastolic (congestive) heart failure: Secondary | ICD-10-CM | POA: Diagnosis not present

## 2014-06-28 DIAGNOSIS — I2510 Atherosclerotic heart disease of native coronary artery without angina pectoris: Secondary | ICD-10-CM | POA: Diagnosis not present

## 2014-06-28 DIAGNOSIS — E1065 Type 1 diabetes mellitus with hyperglycemia: Secondary | ICD-10-CM | POA: Diagnosis not present

## 2014-06-28 DIAGNOSIS — I110 Hypertensive heart disease with heart failure: Secondary | ICD-10-CM | POA: Diagnosis not present

## 2014-06-29 DIAGNOSIS — I10 Essential (primary) hypertension: Secondary | ICD-10-CM | POA: Diagnosis not present

## 2014-06-29 DIAGNOSIS — M869 Osteomyelitis, unspecified: Secondary | ICD-10-CM | POA: Diagnosis not present

## 2014-06-29 DIAGNOSIS — I5022 Chronic systolic (congestive) heart failure: Secondary | ICD-10-CM | POA: Diagnosis not present

## 2014-06-29 DIAGNOSIS — E1142 Type 2 diabetes mellitus with diabetic polyneuropathy: Secondary | ICD-10-CM | POA: Diagnosis not present

## 2014-06-30 DIAGNOSIS — E785 Hyperlipidemia, unspecified: Secondary | ICD-10-CM | POA: Diagnosis not present

## 2014-06-30 DIAGNOSIS — E1065 Type 1 diabetes mellitus with hyperglycemia: Secondary | ICD-10-CM | POA: Diagnosis not present

## 2014-06-30 DIAGNOSIS — I10 Essential (primary) hypertension: Secondary | ICD-10-CM | POA: Diagnosis not present

## 2014-07-01 DIAGNOSIS — E860 Dehydration: Secondary | ICD-10-CM | POA: Diagnosis not present

## 2014-07-01 DIAGNOSIS — Z9089 Acquired absence of other organs: Secondary | ICD-10-CM | POA: Diagnosis present

## 2014-07-01 DIAGNOSIS — F329 Major depressive disorder, single episode, unspecified: Secondary | ICD-10-CM | POA: Diagnosis present

## 2014-07-01 DIAGNOSIS — R0902 Hypoxemia: Secondary | ICD-10-CM | POA: Diagnosis not present

## 2014-07-01 DIAGNOSIS — K219 Gastro-esophageal reflux disease without esophagitis: Secondary | ICD-10-CM | POA: Diagnosis present

## 2014-07-01 DIAGNOSIS — M868X7 Other osteomyelitis, ankle and foot: Secondary | ICD-10-CM | POA: Diagnosis present

## 2014-07-01 DIAGNOSIS — F419 Anxiety disorder, unspecified: Secondary | ICD-10-CM | POA: Diagnosis present

## 2014-07-01 DIAGNOSIS — R0989 Other specified symptoms and signs involving the circulatory and respiratory systems: Secondary | ICD-10-CM | POA: Diagnosis not present

## 2014-07-01 DIAGNOSIS — J9601 Acute respiratory failure with hypoxia: Secondary | ICD-10-CM | POA: Diagnosis not present

## 2014-07-01 DIAGNOSIS — Z90710 Acquired absence of both cervix and uterus: Secondary | ICD-10-CM | POA: Diagnosis not present

## 2014-07-01 DIAGNOSIS — I517 Cardiomegaly: Secondary | ICD-10-CM | POA: Diagnosis not present

## 2014-07-01 DIAGNOSIS — E780 Pure hypercholesterolemia: Secondary | ICD-10-CM | POA: Diagnosis present

## 2014-07-01 DIAGNOSIS — R7989 Other specified abnormal findings of blood chemistry: Secondary | ICD-10-CM | POA: Diagnosis not present

## 2014-07-01 DIAGNOSIS — L97519 Non-pressure chronic ulcer of other part of right foot with unspecified severity: Secondary | ICD-10-CM | POA: Diagnosis not present

## 2014-07-01 DIAGNOSIS — I10 Essential (primary) hypertension: Secondary | ICD-10-CM | POA: Diagnosis present

## 2014-07-01 DIAGNOSIS — M869 Osteomyelitis, unspecified: Secondary | ICD-10-CM | POA: Diagnosis not present

## 2014-07-01 DIAGNOSIS — G92 Toxic encephalopathy: Secondary | ICD-10-CM | POA: Diagnosis not present

## 2014-07-01 DIAGNOSIS — R531 Weakness: Secondary | ICD-10-CM | POA: Diagnosis not present

## 2014-07-01 DIAGNOSIS — I959 Hypotension, unspecified: Secondary | ICD-10-CM | POA: Diagnosis not present

## 2014-07-01 DIAGNOSIS — E1142 Type 2 diabetes mellitus with diabetic polyneuropathy: Secondary | ICD-10-CM | POA: Diagnosis not present

## 2014-07-01 DIAGNOSIS — J449 Chronic obstructive pulmonary disease, unspecified: Secondary | ICD-10-CM | POA: Diagnosis present

## 2014-07-01 DIAGNOSIS — Z91013 Allergy to seafood: Secondary | ICD-10-CM | POA: Diagnosis not present

## 2014-07-01 DIAGNOSIS — I509 Heart failure, unspecified: Secondary | ICD-10-CM | POA: Diagnosis not present

## 2014-07-01 DIAGNOSIS — Z888 Allergy status to other drugs, medicaments and biological substances status: Secondary | ICD-10-CM | POA: Diagnosis not present

## 2014-07-01 DIAGNOSIS — N179 Acute kidney failure, unspecified: Secondary | ICD-10-CM | POA: Diagnosis not present

## 2014-07-05 DIAGNOSIS — J449 Chronic obstructive pulmonary disease, unspecified: Secondary | ICD-10-CM | POA: Diagnosis not present

## 2014-07-05 DIAGNOSIS — I509 Heart failure, unspecified: Secondary | ICD-10-CM | POA: Diagnosis not present

## 2014-07-05 DIAGNOSIS — I10 Essential (primary) hypertension: Secondary | ICD-10-CM | POA: Diagnosis not present

## 2014-07-05 DIAGNOSIS — M86171 Other acute osteomyelitis, right ankle and foot: Secondary | ICD-10-CM | POA: Diagnosis not present

## 2014-07-05 DIAGNOSIS — E10621 Type 1 diabetes mellitus with foot ulcer: Secondary | ICD-10-CM | POA: Diagnosis not present

## 2014-07-05 DIAGNOSIS — L97514 Non-pressure chronic ulcer of other part of right foot with necrosis of bone: Secondary | ICD-10-CM | POA: Diagnosis not present

## 2014-07-11 DIAGNOSIS — I517 Cardiomegaly: Secondary | ICD-10-CM | POA: Diagnosis not present

## 2014-07-11 DIAGNOSIS — E780 Pure hypercholesterolemia: Secondary | ICD-10-CM | POA: Diagnosis not present

## 2014-07-11 DIAGNOSIS — R0602 Shortness of breath: Secondary | ICD-10-CM | POA: Diagnosis not present

## 2014-07-11 DIAGNOSIS — R05 Cough: Secondary | ICD-10-CM | POA: Diagnosis not present

## 2014-07-11 DIAGNOSIS — Z794 Long term (current) use of insulin: Secondary | ICD-10-CM | POA: Diagnosis not present

## 2014-07-11 DIAGNOSIS — I509 Heart failure, unspecified: Secondary | ICD-10-CM | POA: Diagnosis not present

## 2014-07-11 DIAGNOSIS — R0989 Other specified symptoms and signs involving the circulatory and respiratory systems: Secondary | ICD-10-CM | POA: Diagnosis not present

## 2014-07-11 DIAGNOSIS — I10 Essential (primary) hypertension: Secondary | ICD-10-CM | POA: Diagnosis not present

## 2014-07-11 DIAGNOSIS — J441 Chronic obstructive pulmonary disease with (acute) exacerbation: Secondary | ICD-10-CM | POA: Diagnosis not present

## 2014-07-11 DIAGNOSIS — N289 Disorder of kidney and ureter, unspecified: Secondary | ICD-10-CM | POA: Diagnosis not present

## 2014-07-11 DIAGNOSIS — E118 Type 2 diabetes mellitus with unspecified complications: Secondary | ICD-10-CM | POA: Diagnosis not present

## 2014-07-12 DIAGNOSIS — J441 Chronic obstructive pulmonary disease with (acute) exacerbation: Secondary | ICD-10-CM | POA: Diagnosis not present

## 2014-07-12 DIAGNOSIS — N178 Other acute kidney failure: Secondary | ICD-10-CM | POA: Diagnosis not present

## 2014-07-12 DIAGNOSIS — E860 Dehydration: Secondary | ICD-10-CM | POA: Diagnosis not present

## 2014-07-12 DIAGNOSIS — I952 Hypotension due to drugs: Secondary | ICD-10-CM | POA: Diagnosis not present

## 2014-07-12 DIAGNOSIS — I5030 Unspecified diastolic (congestive) heart failure: Secondary | ICD-10-CM | POA: Diagnosis not present

## 2014-07-12 DIAGNOSIS — E1142 Type 2 diabetes mellitus with diabetic polyneuropathy: Secondary | ICD-10-CM | POA: Diagnosis not present

## 2014-07-23 DIAGNOSIS — I87301 Chronic venous hypertension (idiopathic) without complications of right lower extremity: Secondary | ICD-10-CM | POA: Diagnosis not present

## 2014-07-23 DIAGNOSIS — I517 Cardiomegaly: Secondary | ICD-10-CM | POA: Diagnosis not present

## 2014-07-23 DIAGNOSIS — M86071 Acute hematogenous osteomyelitis, right ankle and foot: Secondary | ICD-10-CM | POA: Diagnosis not present

## 2014-07-23 DIAGNOSIS — J449 Chronic obstructive pulmonary disease, unspecified: Secondary | ICD-10-CM | POA: Diagnosis not present

## 2014-07-23 DIAGNOSIS — J811 Chronic pulmonary edema: Secondary | ICD-10-CM | POA: Diagnosis not present

## 2014-07-23 DIAGNOSIS — L97821 Non-pressure chronic ulcer of other part of left lower leg limited to breakdown of skin: Secondary | ICD-10-CM | POA: Diagnosis not present

## 2014-07-23 DIAGNOSIS — L97519 Non-pressure chronic ulcer of other part of right foot with unspecified severity: Secondary | ICD-10-CM | POA: Diagnosis not present

## 2014-07-23 DIAGNOSIS — E10621 Type 1 diabetes mellitus with foot ulcer: Secondary | ICD-10-CM | POA: Diagnosis not present

## 2014-07-26 DIAGNOSIS — M86171 Other acute osteomyelitis, right ankle and foot: Secondary | ICD-10-CM | POA: Diagnosis not present

## 2014-07-26 DIAGNOSIS — M86671 Other chronic osteomyelitis, right ankle and foot: Secondary | ICD-10-CM | POA: Diagnosis not present

## 2014-07-26 DIAGNOSIS — L97514 Non-pressure chronic ulcer of other part of right foot with necrosis of bone: Secondary | ICD-10-CM | POA: Diagnosis not present

## 2014-07-26 DIAGNOSIS — E10621 Type 1 diabetes mellitus with foot ulcer: Secondary | ICD-10-CM | POA: Diagnosis not present

## 2014-07-27 DIAGNOSIS — M86171 Other acute osteomyelitis, right ankle and foot: Secondary | ICD-10-CM | POA: Diagnosis not present

## 2014-07-27 DIAGNOSIS — E10621 Type 1 diabetes mellitus with foot ulcer: Secondary | ICD-10-CM | POA: Diagnosis not present

## 2014-07-27 DIAGNOSIS — M86671 Other chronic osteomyelitis, right ankle and foot: Secondary | ICD-10-CM | POA: Diagnosis not present

## 2014-07-27 DIAGNOSIS — L97514 Non-pressure chronic ulcer of other part of right foot with necrosis of bone: Secondary | ICD-10-CM | POA: Diagnosis not present

## 2014-07-28 DIAGNOSIS — E10621 Type 1 diabetes mellitus with foot ulcer: Secondary | ICD-10-CM | POA: Diagnosis not present

## 2014-07-28 DIAGNOSIS — M86171 Other acute osteomyelitis, right ankle and foot: Secondary | ICD-10-CM | POA: Diagnosis not present

## 2014-07-28 DIAGNOSIS — L97514 Non-pressure chronic ulcer of other part of right foot with necrosis of bone: Secondary | ICD-10-CM | POA: Diagnosis not present

## 2014-07-28 DIAGNOSIS — M86671 Other chronic osteomyelitis, right ankle and foot: Secondary | ICD-10-CM | POA: Diagnosis not present

## 2014-07-29 DIAGNOSIS — E10621 Type 1 diabetes mellitus with foot ulcer: Secondary | ICD-10-CM | POA: Diagnosis not present

## 2014-07-29 DIAGNOSIS — M86171 Other acute osteomyelitis, right ankle and foot: Secondary | ICD-10-CM | POA: Diagnosis not present

## 2014-07-29 DIAGNOSIS — L97514 Non-pressure chronic ulcer of other part of right foot with necrosis of bone: Secondary | ICD-10-CM | POA: Diagnosis not present

## 2014-07-29 DIAGNOSIS — M86671 Other chronic osteomyelitis, right ankle and foot: Secondary | ICD-10-CM | POA: Diagnosis not present

## 2014-07-30 DIAGNOSIS — M86171 Other acute osteomyelitis, right ankle and foot: Secondary | ICD-10-CM | POA: Diagnosis not present

## 2014-07-30 DIAGNOSIS — M86671 Other chronic osteomyelitis, right ankle and foot: Secondary | ICD-10-CM | POA: Diagnosis not present

## 2014-07-30 DIAGNOSIS — E10621 Type 1 diabetes mellitus with foot ulcer: Secondary | ICD-10-CM | POA: Diagnosis not present

## 2014-07-30 DIAGNOSIS — L97514 Non-pressure chronic ulcer of other part of right foot with necrosis of bone: Secondary | ICD-10-CM | POA: Diagnosis not present

## 2014-08-03 DIAGNOSIS — E10621 Type 1 diabetes mellitus with foot ulcer: Secondary | ICD-10-CM | POA: Diagnosis not present

## 2014-08-03 DIAGNOSIS — M86671 Other chronic osteomyelitis, right ankle and foot: Secondary | ICD-10-CM | POA: Diagnosis not present

## 2014-08-03 DIAGNOSIS — L97514 Non-pressure chronic ulcer of other part of right foot with necrosis of bone: Secondary | ICD-10-CM | POA: Diagnosis not present

## 2014-08-03 DIAGNOSIS — M86171 Other acute osteomyelitis, right ankle and foot: Secondary | ICD-10-CM | POA: Diagnosis not present

## 2014-08-03 DIAGNOSIS — I110 Hypertensive heart disease with heart failure: Secondary | ICD-10-CM | POA: Diagnosis not present

## 2014-08-04 DIAGNOSIS — L97514 Non-pressure chronic ulcer of other part of right foot with necrosis of bone: Secondary | ICD-10-CM | POA: Diagnosis not present

## 2014-08-04 DIAGNOSIS — I87301 Chronic venous hypertension (idiopathic) without complications of right lower extremity: Secondary | ICD-10-CM | POA: Diagnosis not present

## 2014-08-04 DIAGNOSIS — E11621 Type 2 diabetes mellitus with foot ulcer: Secondary | ICD-10-CM | POA: Diagnosis not present

## 2014-08-04 DIAGNOSIS — J449 Chronic obstructive pulmonary disease, unspecified: Secondary | ICD-10-CM | POA: Diagnosis not present

## 2014-08-04 DIAGNOSIS — M86671 Other chronic osteomyelitis, right ankle and foot: Secondary | ICD-10-CM | POA: Diagnosis not present

## 2014-08-04 DIAGNOSIS — L97829 Non-pressure chronic ulcer of other part of left lower leg with unspecified severity: Secondary | ICD-10-CM | POA: Diagnosis not present

## 2014-08-04 DIAGNOSIS — E10621 Type 1 diabetes mellitus with foot ulcer: Secondary | ICD-10-CM | POA: Diagnosis not present

## 2014-08-04 DIAGNOSIS — M86171 Other acute osteomyelitis, right ankle and foot: Secondary | ICD-10-CM | POA: Diagnosis not present

## 2014-08-05 DIAGNOSIS — M86671 Other chronic osteomyelitis, right ankle and foot: Secondary | ICD-10-CM | POA: Diagnosis not present

## 2014-08-05 DIAGNOSIS — L97514 Non-pressure chronic ulcer of other part of right foot with necrosis of bone: Secondary | ICD-10-CM | POA: Diagnosis not present

## 2014-08-05 DIAGNOSIS — E10621 Type 1 diabetes mellitus with foot ulcer: Secondary | ICD-10-CM | POA: Diagnosis not present

## 2014-08-05 DIAGNOSIS — M86171 Other acute osteomyelitis, right ankle and foot: Secondary | ICD-10-CM | POA: Diagnosis not present

## 2014-08-11 DIAGNOSIS — M86171 Other acute osteomyelitis, right ankle and foot: Secondary | ICD-10-CM | POA: Diagnosis not present

## 2014-08-11 DIAGNOSIS — M86671 Other chronic osteomyelitis, right ankle and foot: Secondary | ICD-10-CM | POA: Diagnosis not present

## 2014-08-11 DIAGNOSIS — E10621 Type 1 diabetes mellitus with foot ulcer: Secondary | ICD-10-CM | POA: Diagnosis not present

## 2014-08-11 DIAGNOSIS — L97514 Non-pressure chronic ulcer of other part of right foot with necrosis of bone: Secondary | ICD-10-CM | POA: Diagnosis not present

## 2014-08-12 DIAGNOSIS — K219 Gastro-esophageal reflux disease without esophagitis: Secondary | ICD-10-CM | POA: Diagnosis not present

## 2014-08-12 DIAGNOSIS — Z794 Long term (current) use of insulin: Secondary | ICD-10-CM | POA: Diagnosis not present

## 2014-08-12 DIAGNOSIS — E780 Pure hypercholesterolemia: Secondary | ICD-10-CM | POA: Diagnosis not present

## 2014-08-12 DIAGNOSIS — M869 Osteomyelitis, unspecified: Secondary | ICD-10-CM | POA: Diagnosis not present

## 2014-08-12 DIAGNOSIS — I10 Essential (primary) hypertension: Secondary | ICD-10-CM | POA: Diagnosis not present

## 2014-08-12 DIAGNOSIS — Z79899 Other long term (current) drug therapy: Secondary | ICD-10-CM | POA: Diagnosis not present

## 2014-08-12 DIAGNOSIS — F418 Other specified anxiety disorders: Secondary | ICD-10-CM | POA: Diagnosis not present

## 2014-08-12 DIAGNOSIS — I517 Cardiomegaly: Secondary | ICD-10-CM | POA: Diagnosis not present

## 2014-08-12 DIAGNOSIS — M86171 Other acute osteomyelitis, right ankle and foot: Secondary | ICD-10-CM | POA: Diagnosis not present

## 2014-08-12 DIAGNOSIS — R079 Chest pain, unspecified: Secondary | ICD-10-CM | POA: Diagnosis not present

## 2014-08-12 DIAGNOSIS — R0602 Shortness of breath: Secondary | ICD-10-CM | POA: Diagnosis not present

## 2014-08-12 DIAGNOSIS — I209 Angina pectoris, unspecified: Secondary | ICD-10-CM | POA: Diagnosis not present

## 2014-08-12 DIAGNOSIS — G894 Chronic pain syndrome: Secondary | ICD-10-CM | POA: Diagnosis not present

## 2014-08-12 DIAGNOSIS — K3184 Gastroparesis: Secondary | ICD-10-CM | POA: Diagnosis not present

## 2014-08-12 DIAGNOSIS — E1142 Type 2 diabetes mellitus with diabetic polyneuropathy: Secondary | ICD-10-CM | POA: Diagnosis not present

## 2014-08-12 DIAGNOSIS — J9811 Atelectasis: Secondary | ICD-10-CM | POA: Diagnosis not present

## 2014-08-12 DIAGNOSIS — R072 Precordial pain: Secondary | ICD-10-CM | POA: Diagnosis not present

## 2014-08-13 DIAGNOSIS — R079 Chest pain, unspecified: Secondary | ICD-10-CM | POA: Diagnosis not present

## 2014-08-13 HISTORY — PX: CARDIOVASCULAR STRESS TEST: SHX262

## 2014-08-16 DIAGNOSIS — E1042 Type 1 diabetes mellitus with diabetic polyneuropathy: Secondary | ICD-10-CM | POA: Diagnosis not present

## 2014-08-16 DIAGNOSIS — K219 Gastro-esophageal reflux disease without esophagitis: Secondary | ICD-10-CM | POA: Diagnosis not present

## 2014-08-16 DIAGNOSIS — J9811 Atelectasis: Secondary | ICD-10-CM | POA: Diagnosis not present

## 2014-08-16 DIAGNOSIS — I517 Cardiomegaly: Secondary | ICD-10-CM | POA: Diagnosis not present

## 2014-08-16 DIAGNOSIS — J449 Chronic obstructive pulmonary disease, unspecified: Secondary | ICD-10-CM | POA: Diagnosis not present

## 2014-08-17 DIAGNOSIS — M86171 Other acute osteomyelitis, right ankle and foot: Secondary | ICD-10-CM | POA: Diagnosis not present

## 2014-08-17 DIAGNOSIS — L97514 Non-pressure chronic ulcer of other part of right foot with necrosis of bone: Secondary | ICD-10-CM | POA: Diagnosis not present

## 2014-08-17 DIAGNOSIS — E10621 Type 1 diabetes mellitus with foot ulcer: Secondary | ICD-10-CM | POA: Diagnosis not present

## 2014-08-17 DIAGNOSIS — M86671 Other chronic osteomyelitis, right ankle and foot: Secondary | ICD-10-CM | POA: Diagnosis not present

## 2014-08-18 DIAGNOSIS — M86671 Other chronic osteomyelitis, right ankle and foot: Secondary | ICD-10-CM | POA: Diagnosis not present

## 2014-08-18 DIAGNOSIS — M86171 Other acute osteomyelitis, right ankle and foot: Secondary | ICD-10-CM | POA: Diagnosis not present

## 2014-08-18 DIAGNOSIS — E10621 Type 1 diabetes mellitus with foot ulcer: Secondary | ICD-10-CM | POA: Diagnosis not present

## 2014-08-18 DIAGNOSIS — L97514 Non-pressure chronic ulcer of other part of right foot with necrosis of bone: Secondary | ICD-10-CM | POA: Diagnosis not present

## 2014-08-19 DIAGNOSIS — M86171 Other acute osteomyelitis, right ankle and foot: Secondary | ICD-10-CM | POA: Diagnosis not present

## 2014-08-19 DIAGNOSIS — E10621 Type 1 diabetes mellitus with foot ulcer: Secondary | ICD-10-CM | POA: Diagnosis not present

## 2014-08-19 DIAGNOSIS — M86671 Other chronic osteomyelitis, right ankle and foot: Secondary | ICD-10-CM | POA: Diagnosis not present

## 2014-08-19 DIAGNOSIS — L97514 Non-pressure chronic ulcer of other part of right foot with necrosis of bone: Secondary | ICD-10-CM | POA: Diagnosis not present

## 2014-08-20 DIAGNOSIS — M86171 Other acute osteomyelitis, right ankle and foot: Secondary | ICD-10-CM | POA: Diagnosis not present

## 2014-08-20 DIAGNOSIS — E10621 Type 1 diabetes mellitus with foot ulcer: Secondary | ICD-10-CM | POA: Diagnosis not present

## 2014-08-20 DIAGNOSIS — M86671 Other chronic osteomyelitis, right ankle and foot: Secondary | ICD-10-CM | POA: Diagnosis not present

## 2014-08-20 DIAGNOSIS — L97514 Non-pressure chronic ulcer of other part of right foot with necrosis of bone: Secondary | ICD-10-CM | POA: Diagnosis not present

## 2014-08-24 DIAGNOSIS — E10621 Type 1 diabetes mellitus with foot ulcer: Secondary | ICD-10-CM | POA: Diagnosis not present

## 2014-08-24 DIAGNOSIS — L97514 Non-pressure chronic ulcer of other part of right foot with necrosis of bone: Secondary | ICD-10-CM | POA: Diagnosis not present

## 2014-08-24 DIAGNOSIS — M86671 Other chronic osteomyelitis, right ankle and foot: Secondary | ICD-10-CM | POA: Diagnosis not present

## 2014-08-24 DIAGNOSIS — M86171 Other acute osteomyelitis, right ankle and foot: Secondary | ICD-10-CM | POA: Diagnosis not present

## 2014-08-25 DIAGNOSIS — E10621 Type 1 diabetes mellitus with foot ulcer: Secondary | ICD-10-CM | POA: Diagnosis not present

## 2014-08-25 DIAGNOSIS — M86671 Other chronic osteomyelitis, right ankle and foot: Secondary | ICD-10-CM | POA: Diagnosis not present

## 2014-08-25 DIAGNOSIS — L97514 Non-pressure chronic ulcer of other part of right foot with necrosis of bone: Secondary | ICD-10-CM | POA: Diagnosis not present

## 2014-08-25 DIAGNOSIS — M86171 Other acute osteomyelitis, right ankle and foot: Secondary | ICD-10-CM | POA: Diagnosis not present

## 2014-08-26 DIAGNOSIS — M86171 Other acute osteomyelitis, right ankle and foot: Secondary | ICD-10-CM | POA: Diagnosis not present

## 2014-08-26 DIAGNOSIS — M86671 Other chronic osteomyelitis, right ankle and foot: Secondary | ICD-10-CM | POA: Diagnosis not present

## 2014-08-26 DIAGNOSIS — E10621 Type 1 diabetes mellitus with foot ulcer: Secondary | ICD-10-CM | POA: Diagnosis not present

## 2014-08-26 DIAGNOSIS — L97514 Non-pressure chronic ulcer of other part of right foot with necrosis of bone: Secondary | ICD-10-CM | POA: Diagnosis not present

## 2014-08-27 DIAGNOSIS — L97514 Non-pressure chronic ulcer of other part of right foot with necrosis of bone: Secondary | ICD-10-CM | POA: Diagnosis not present

## 2014-08-27 DIAGNOSIS — E10621 Type 1 diabetes mellitus with foot ulcer: Secondary | ICD-10-CM | POA: Diagnosis not present

## 2014-08-27 DIAGNOSIS — M86171 Other acute osteomyelitis, right ankle and foot: Secondary | ICD-10-CM | POA: Diagnosis not present

## 2014-08-27 DIAGNOSIS — M86671 Other chronic osteomyelitis, right ankle and foot: Secondary | ICD-10-CM | POA: Diagnosis not present

## 2014-09-01 DIAGNOSIS — M86671 Other chronic osteomyelitis, right ankle and foot: Secondary | ICD-10-CM | POA: Diagnosis not present

## 2014-09-01 DIAGNOSIS — E10621 Type 1 diabetes mellitus with foot ulcer: Secondary | ICD-10-CM | POA: Diagnosis not present

## 2014-09-01 DIAGNOSIS — L97514 Non-pressure chronic ulcer of other part of right foot with necrosis of bone: Secondary | ICD-10-CM | POA: Diagnosis not present

## 2014-09-01 DIAGNOSIS — M86171 Other acute osteomyelitis, right ankle and foot: Secondary | ICD-10-CM | POA: Diagnosis not present

## 2014-09-03 DIAGNOSIS — M86171 Other acute osteomyelitis, right ankle and foot: Secondary | ICD-10-CM | POA: Diagnosis not present

## 2014-09-03 DIAGNOSIS — E10621 Type 1 diabetes mellitus with foot ulcer: Secondary | ICD-10-CM | POA: Diagnosis not present

## 2014-09-03 DIAGNOSIS — M86671 Other chronic osteomyelitis, right ankle and foot: Secondary | ICD-10-CM | POA: Diagnosis not present

## 2014-09-03 DIAGNOSIS — L97514 Non-pressure chronic ulcer of other part of right foot with necrosis of bone: Secondary | ICD-10-CM | POA: Diagnosis not present

## 2014-09-06 DIAGNOSIS — M86671 Other chronic osteomyelitis, right ankle and foot: Secondary | ICD-10-CM | POA: Diagnosis not present

## 2014-09-06 DIAGNOSIS — Z872 Personal history of diseases of the skin and subcutaneous tissue: Secondary | ICD-10-CM | POA: Diagnosis not present

## 2014-09-06 DIAGNOSIS — E11622 Type 2 diabetes mellitus with other skin ulcer: Secondary | ICD-10-CM | POA: Diagnosis not present

## 2014-09-06 DIAGNOSIS — E119 Type 2 diabetes mellitus without complications: Secondary | ICD-10-CM | POA: Diagnosis not present

## 2014-09-06 DIAGNOSIS — I87301 Chronic venous hypertension (idiopathic) without complications of right lower extremity: Secondary | ICD-10-CM | POA: Diagnosis not present

## 2014-09-06 DIAGNOSIS — J449 Chronic obstructive pulmonary disease, unspecified: Secondary | ICD-10-CM | POA: Diagnosis not present

## 2014-09-06 DIAGNOSIS — L97821 Non-pressure chronic ulcer of other part of left lower leg limited to breakdown of skin: Secondary | ICD-10-CM | POA: Diagnosis not present

## 2014-09-06 DIAGNOSIS — M86071 Acute hematogenous osteomyelitis, right ankle and foot: Secondary | ICD-10-CM | POA: Diagnosis not present

## 2014-09-07 DIAGNOSIS — M86671 Other chronic osteomyelitis, right ankle and foot: Secondary | ICD-10-CM | POA: Diagnosis not present

## 2014-09-08 DIAGNOSIS — M86671 Other chronic osteomyelitis, right ankle and foot: Secondary | ICD-10-CM | POA: Diagnosis not present

## 2014-09-09 DIAGNOSIS — M86671 Other chronic osteomyelitis, right ankle and foot: Secondary | ICD-10-CM | POA: Diagnosis not present

## 2014-09-09 DIAGNOSIS — E1040 Type 1 diabetes mellitus with diabetic neuropathy, unspecified: Secondary | ICD-10-CM | POA: Diagnosis not present

## 2014-09-13 DIAGNOSIS — M86671 Other chronic osteomyelitis, right ankle and foot: Secondary | ICD-10-CM | POA: Diagnosis not present

## 2014-09-14 DIAGNOSIS — M86671 Other chronic osteomyelitis, right ankle and foot: Secondary | ICD-10-CM | POA: Diagnosis not present

## 2014-09-15 DIAGNOSIS — Z5181 Encounter for therapeutic drug level monitoring: Secondary | ICD-10-CM | POA: Diagnosis not present

## 2014-09-15 DIAGNOSIS — I10 Essential (primary) hypertension: Secondary | ICD-10-CM | POA: Diagnosis not present

## 2014-09-15 DIAGNOSIS — E782 Mixed hyperlipidemia: Secondary | ICD-10-CM | POA: Diagnosis not present

## 2014-09-15 DIAGNOSIS — E11621 Type 2 diabetes mellitus with foot ulcer: Secondary | ICD-10-CM | POA: Diagnosis not present

## 2014-09-15 DIAGNOSIS — E1142 Type 2 diabetes mellitus with diabetic polyneuropathy: Secondary | ICD-10-CM | POA: Diagnosis not present

## 2014-09-15 DIAGNOSIS — E780 Pure hypercholesterolemia: Secondary | ICD-10-CM | POA: Diagnosis not present

## 2014-09-15 DIAGNOSIS — M86071 Acute hematogenous osteomyelitis, right ankle and foot: Secondary | ICD-10-CM | POA: Diagnosis not present

## 2014-09-15 DIAGNOSIS — M86671 Other chronic osteomyelitis, right ankle and foot: Secondary | ICD-10-CM | POA: Diagnosis not present

## 2014-09-15 DIAGNOSIS — F5102 Adjustment insomnia: Secondary | ICD-10-CM | POA: Diagnosis not present

## 2014-09-15 DIAGNOSIS — R2681 Unsteadiness on feet: Secondary | ICD-10-CM | POA: Diagnosis not present

## 2014-09-15 DIAGNOSIS — M545 Low back pain: Secondary | ICD-10-CM | POA: Diagnosis not present

## 2014-09-15 DIAGNOSIS — L97514 Non-pressure chronic ulcer of other part of right foot with necrosis of bone: Secondary | ICD-10-CM | POA: Diagnosis not present

## 2014-09-15 DIAGNOSIS — I5030 Unspecified diastolic (congestive) heart failure: Secondary | ICD-10-CM | POA: Diagnosis not present

## 2014-09-15 DIAGNOSIS — Z79899 Other long term (current) drug therapy: Secondary | ICD-10-CM | POA: Diagnosis not present

## 2014-09-16 DIAGNOSIS — I509 Heart failure, unspecified: Secondary | ICD-10-CM | POA: Diagnosis not present

## 2014-09-16 DIAGNOSIS — Z794 Long term (current) use of insulin: Secondary | ICD-10-CM | POA: Diagnosis not present

## 2014-09-16 DIAGNOSIS — J449 Chronic obstructive pulmonary disease, unspecified: Secondary | ICD-10-CM | POA: Diagnosis not present

## 2014-09-16 DIAGNOSIS — E780 Pure hypercholesterolemia: Secondary | ICD-10-CM | POA: Diagnosis not present

## 2014-09-16 DIAGNOSIS — R0789 Other chest pain: Secondary | ICD-10-CM | POA: Diagnosis not present

## 2014-09-16 DIAGNOSIS — R002 Palpitations: Secondary | ICD-10-CM | POA: Diagnosis not present

## 2014-09-16 DIAGNOSIS — E119 Type 2 diabetes mellitus without complications: Secondary | ICD-10-CM | POA: Diagnosis not present

## 2014-09-16 DIAGNOSIS — R05 Cough: Secondary | ICD-10-CM | POA: Diagnosis not present

## 2014-09-16 DIAGNOSIS — R079 Chest pain, unspecified: Secondary | ICD-10-CM | POA: Diagnosis not present

## 2014-09-16 DIAGNOSIS — I10 Essential (primary) hypertension: Secondary | ICD-10-CM | POA: Diagnosis not present

## 2014-09-16 DIAGNOSIS — M86671 Other chronic osteomyelitis, right ankle and foot: Secondary | ICD-10-CM | POA: Diagnosis not present

## 2014-09-16 DIAGNOSIS — R0602 Shortness of breath: Secondary | ICD-10-CM | POA: Diagnosis not present

## 2014-09-17 DIAGNOSIS — M86671 Other chronic osteomyelitis, right ankle and foot: Secondary | ICD-10-CM | POA: Diagnosis not present

## 2014-09-20 DIAGNOSIS — M86671 Other chronic osteomyelitis, right ankle and foot: Secondary | ICD-10-CM | POA: Diagnosis not present

## 2014-09-21 DIAGNOSIS — Z89432 Acquired absence of left foot: Secondary | ICD-10-CM | POA: Diagnosis not present

## 2014-09-21 DIAGNOSIS — E1042 Type 1 diabetes mellitus with diabetic polyneuropathy: Secondary | ICD-10-CM | POA: Diagnosis not present

## 2014-09-21 DIAGNOSIS — M86671 Other chronic osteomyelitis, right ankle and foot: Secondary | ICD-10-CM | POA: Diagnosis not present

## 2014-09-21 DIAGNOSIS — L97514 Non-pressure chronic ulcer of other part of right foot with necrosis of bone: Secondary | ICD-10-CM | POA: Diagnosis not present

## 2014-09-21 DIAGNOSIS — E11621 Type 2 diabetes mellitus with foot ulcer: Secondary | ICD-10-CM | POA: Diagnosis not present

## 2014-09-22 DIAGNOSIS — M86671 Other chronic osteomyelitis, right ankle and foot: Secondary | ICD-10-CM | POA: Diagnosis not present

## 2014-09-23 DIAGNOSIS — E119 Type 2 diabetes mellitus without complications: Secondary | ICD-10-CM | POA: Diagnosis not present

## 2014-09-23 DIAGNOSIS — Z5309 Procedure and treatment not carried out because of other contraindication: Secondary | ICD-10-CM | POA: Diagnosis not present

## 2014-09-23 DIAGNOSIS — M86671 Other chronic osteomyelitis, right ankle and foot: Secondary | ICD-10-CM | POA: Diagnosis not present

## 2014-09-23 DIAGNOSIS — Z794 Long term (current) use of insulin: Secondary | ICD-10-CM | POA: Diagnosis not present

## 2014-09-24 DIAGNOSIS — M86671 Other chronic osteomyelitis, right ankle and foot: Secondary | ICD-10-CM | POA: Diagnosis not present

## 2014-09-27 DIAGNOSIS — M86671 Other chronic osteomyelitis, right ankle and foot: Secondary | ICD-10-CM | POA: Diagnosis not present

## 2014-09-28 DIAGNOSIS — M86671 Other chronic osteomyelitis, right ankle and foot: Secondary | ICD-10-CM | POA: Diagnosis not present

## 2014-09-29 DIAGNOSIS — I2510 Atherosclerotic heart disease of native coronary artery without angina pectoris: Secondary | ICD-10-CM | POA: Diagnosis not present

## 2014-09-29 DIAGNOSIS — M86671 Other chronic osteomyelitis, right ankle and foot: Secondary | ICD-10-CM | POA: Diagnosis not present

## 2014-09-29 DIAGNOSIS — I10 Essential (primary) hypertension: Secondary | ICD-10-CM | POA: Diagnosis not present

## 2014-09-29 DIAGNOSIS — I110 Hypertensive heart disease with heart failure: Secondary | ICD-10-CM | POA: Diagnosis not present

## 2014-10-05 DIAGNOSIS — E13359 Other specified diabetes mellitus with proliferative diabetic retinopathy without macular edema: Secondary | ICD-10-CM | POA: Diagnosis not present

## 2014-10-06 DIAGNOSIS — R140 Abdominal distension (gaseous): Secondary | ICD-10-CM | POA: Diagnosis not present

## 2014-10-06 DIAGNOSIS — R110 Nausea: Secondary | ICD-10-CM | POA: Diagnosis not present

## 2014-10-06 DIAGNOSIS — K5900 Constipation, unspecified: Secondary | ICD-10-CM | POA: Diagnosis not present

## 2014-10-08 DIAGNOSIS — R110 Nausea: Secondary | ICD-10-CM | POA: Diagnosis not present

## 2014-10-13 DIAGNOSIS — I5032 Chronic diastolic (congestive) heart failure: Secondary | ICD-10-CM | POA: Diagnosis not present

## 2014-10-13 DIAGNOSIS — I10 Essential (primary) hypertension: Secondary | ICD-10-CM | POA: Diagnosis not present

## 2014-10-13 DIAGNOSIS — E119 Type 2 diabetes mellitus without complications: Secondary | ICD-10-CM | POA: Diagnosis not present

## 2014-11-09 DIAGNOSIS — M722 Plantar fascial fibromatosis: Secondary | ICD-10-CM | POA: Diagnosis not present

## 2014-11-11 DIAGNOSIS — M24571 Contracture, right ankle: Secondary | ICD-10-CM | POA: Diagnosis not present

## 2014-11-11 DIAGNOSIS — M722 Plantar fascial fibromatosis: Secondary | ICD-10-CM | POA: Diagnosis not present

## 2014-11-11 DIAGNOSIS — E1042 Type 1 diabetes mellitus with diabetic polyneuropathy: Secondary | ICD-10-CM | POA: Diagnosis not present

## 2014-11-11 DIAGNOSIS — Z89432 Acquired absence of left foot: Secondary | ICD-10-CM | POA: Diagnosis not present

## 2014-11-11 DIAGNOSIS — M24574 Contracture, right foot: Secondary | ICD-10-CM | POA: Diagnosis not present

## 2014-12-07 DIAGNOSIS — R221 Localized swelling, mass and lump, neck: Secondary | ICD-10-CM | POA: Diagnosis not present

## 2014-12-07 DIAGNOSIS — I10 Essential (primary) hypertension: Secondary | ICD-10-CM | POA: Diagnosis not present

## 2014-12-23 DIAGNOSIS — I5030 Unspecified diastolic (congestive) heart failure: Secondary | ICD-10-CM | POA: Diagnosis not present

## 2014-12-23 DIAGNOSIS — F5102 Adjustment insomnia: Secondary | ICD-10-CM | POA: Diagnosis not present

## 2014-12-23 DIAGNOSIS — R2681 Unsteadiness on feet: Secondary | ICD-10-CM | POA: Diagnosis not present

## 2014-12-23 DIAGNOSIS — R5383 Other fatigue: Secondary | ICD-10-CM | POA: Diagnosis not present

## 2014-12-23 DIAGNOSIS — E780 Pure hypercholesterolemia: Secondary | ICD-10-CM | POA: Diagnosis not present

## 2014-12-23 DIAGNOSIS — I10 Essential (primary) hypertension: Secondary | ICD-10-CM | POA: Diagnosis not present

## 2014-12-23 DIAGNOSIS — M545 Low back pain: Secondary | ICD-10-CM | POA: Diagnosis not present

## 2014-12-23 DIAGNOSIS — E782 Mixed hyperlipidemia: Secondary | ICD-10-CM | POA: Diagnosis not present

## 2014-12-23 DIAGNOSIS — E1142 Type 2 diabetes mellitus with diabetic polyneuropathy: Secondary | ICD-10-CM | POA: Diagnosis not present

## 2014-12-31 DIAGNOSIS — G478 Other sleep disorders: Secondary | ICD-10-CM | POA: Diagnosis not present

## 2015-01-12 DIAGNOSIS — R05 Cough: Secondary | ICD-10-CM | POA: Diagnosis not present

## 2015-01-12 DIAGNOSIS — J069 Acute upper respiratory infection, unspecified: Secondary | ICD-10-CM | POA: Diagnosis not present

## 2015-01-12 DIAGNOSIS — R0602 Shortness of breath: Secondary | ICD-10-CM | POA: Diagnosis not present

## 2015-01-12 DIAGNOSIS — E1142 Type 2 diabetes mellitus with diabetic polyneuropathy: Secondary | ICD-10-CM | POA: Diagnosis not present

## 2015-01-12 DIAGNOSIS — R509 Fever, unspecified: Secondary | ICD-10-CM | POA: Diagnosis not present

## 2015-01-20 DIAGNOSIS — L820 Inflamed seborrheic keratosis: Secondary | ICD-10-CM | POA: Diagnosis not present

## 2015-01-20 DIAGNOSIS — L821 Other seborrheic keratosis: Secondary | ICD-10-CM | POA: Diagnosis not present

## 2015-01-25 DIAGNOSIS — R0902 Hypoxemia: Secondary | ICD-10-CM | POA: Diagnosis not present

## 2015-01-31 DIAGNOSIS — Z1231 Encounter for screening mammogram for malignant neoplasm of breast: Secondary | ICD-10-CM | POA: Diagnosis not present

## 2015-02-02 DIAGNOSIS — L723 Sebaceous cyst: Secondary | ICD-10-CM | POA: Diagnosis not present

## 2015-02-04 DIAGNOSIS — I10 Essential (primary) hypertension: Secondary | ICD-10-CM | POA: Diagnosis not present

## 2015-02-04 DIAGNOSIS — R0902 Hypoxemia: Secondary | ICD-10-CM | POA: Diagnosis not present

## 2015-02-10 DIAGNOSIS — N6489 Other specified disorders of breast: Secondary | ICD-10-CM | POA: Diagnosis not present

## 2015-02-10 DIAGNOSIS — R928 Other abnormal and inconclusive findings on diagnostic imaging of breast: Secondary | ICD-10-CM | POA: Diagnosis not present

## 2015-02-18 DIAGNOSIS — I110 Hypertensive heart disease with heart failure: Secondary | ICD-10-CM | POA: Diagnosis not present

## 2015-02-18 DIAGNOSIS — I5032 Chronic diastolic (congestive) heart failure: Secondary | ICD-10-CM | POA: Diagnosis not present

## 2015-02-18 DIAGNOSIS — E785 Hyperlipidemia, unspecified: Secondary | ICD-10-CM | POA: Diagnosis not present

## 2015-03-02 DIAGNOSIS — E1042 Type 1 diabetes mellitus with diabetic polyneuropathy: Secondary | ICD-10-CM | POA: Diagnosis not present

## 2015-03-02 DIAGNOSIS — E1065 Type 1 diabetes mellitus with hyperglycemia: Secondary | ICD-10-CM | POA: Diagnosis not present

## 2015-03-02 DIAGNOSIS — Z794 Long term (current) use of insulin: Secondary | ICD-10-CM | POA: Diagnosis not present

## 2015-03-25 DIAGNOSIS — Z23 Encounter for immunization: Secondary | ICD-10-CM | POA: Diagnosis not present

## 2015-03-25 DIAGNOSIS — G629 Polyneuropathy, unspecified: Secondary | ICD-10-CM | POA: Diagnosis not present

## 2015-03-25 DIAGNOSIS — G40909 Epilepsy, unspecified, not intractable, without status epilepticus: Secondary | ICD-10-CM | POA: Diagnosis not present

## 2015-03-25 DIAGNOSIS — L97511 Non-pressure chronic ulcer of other part of right foot limited to breakdown of skin: Secondary | ICD-10-CM | POA: Diagnosis not present

## 2015-03-25 DIAGNOSIS — L97514 Non-pressure chronic ulcer of other part of right foot with necrosis of bone: Secondary | ICD-10-CM | POA: Diagnosis not present

## 2015-03-25 DIAGNOSIS — Z87891 Personal history of nicotine dependence: Secondary | ICD-10-CM | POA: Diagnosis not present

## 2015-03-25 DIAGNOSIS — I10 Essential (primary) hypertension: Secondary | ICD-10-CM | POA: Diagnosis not present

## 2015-03-25 DIAGNOSIS — R0902 Hypoxemia: Secondary | ICD-10-CM | POA: Diagnosis not present

## 2015-03-25 DIAGNOSIS — Z794 Long term (current) use of insulin: Secondary | ICD-10-CM | POA: Diagnosis not present

## 2015-03-25 DIAGNOSIS — J449 Chronic obstructive pulmonary disease, unspecified: Secondary | ICD-10-CM | POA: Diagnosis not present

## 2015-03-25 DIAGNOSIS — R635 Abnormal weight gain: Secondary | ICD-10-CM | POA: Diagnosis not present

## 2015-03-25 DIAGNOSIS — E10621 Type 1 diabetes mellitus with foot ulcer: Secondary | ICD-10-CM | POA: Diagnosis not present

## 2015-03-25 DIAGNOSIS — R600 Localized edema: Secondary | ICD-10-CM | POA: Diagnosis not present

## 2015-03-25 DIAGNOSIS — M1990 Unspecified osteoarthritis, unspecified site: Secondary | ICD-10-CM | POA: Diagnosis not present

## 2015-03-29 DIAGNOSIS — M47814 Spondylosis without myelopathy or radiculopathy, thoracic region: Secondary | ICD-10-CM | POA: Diagnosis not present

## 2015-03-29 DIAGNOSIS — I5032 Chronic diastolic (congestive) heart failure: Secondary | ICD-10-CM | POA: Diagnosis not present

## 2015-03-29 DIAGNOSIS — I509 Heart failure, unspecified: Secondary | ICD-10-CM | POA: Diagnosis not present

## 2015-03-29 DIAGNOSIS — I110 Hypertensive heart disease with heart failure: Secondary | ICD-10-CM | POA: Diagnosis not present

## 2015-03-29 DIAGNOSIS — R791 Abnormal coagulation profile: Secondary | ICD-10-CM | POA: Diagnosis not present

## 2015-03-29 DIAGNOSIS — R0602 Shortness of breath: Secondary | ICD-10-CM | POA: Diagnosis not present

## 2015-03-29 DIAGNOSIS — E1142 Type 2 diabetes mellitus with diabetic polyneuropathy: Secondary | ICD-10-CM | POA: Diagnosis not present

## 2015-03-29 DIAGNOSIS — I2510 Atherosclerotic heart disease of native coronary artery without angina pectoris: Secondary | ICD-10-CM | POA: Diagnosis not present

## 2015-03-31 DIAGNOSIS — I509 Heart failure, unspecified: Secondary | ICD-10-CM | POA: Diagnosis not present

## 2015-03-31 DIAGNOSIS — I082 Rheumatic disorders of both aortic and tricuspid valves: Secondary | ICD-10-CM | POA: Diagnosis not present

## 2015-04-01 DIAGNOSIS — I10 Essential (primary) hypertension: Secondary | ICD-10-CM | POA: Diagnosis not present

## 2015-04-01 DIAGNOSIS — R600 Localized edema: Secondary | ICD-10-CM | POA: Diagnosis not present

## 2015-04-01 DIAGNOSIS — L97514 Non-pressure chronic ulcer of other part of right foot with necrosis of bone: Secondary | ICD-10-CM | POA: Diagnosis not present

## 2015-04-01 DIAGNOSIS — J449 Chronic obstructive pulmonary disease, unspecified: Secondary | ICD-10-CM | POA: Diagnosis not present

## 2015-04-01 DIAGNOSIS — E1142 Type 2 diabetes mellitus with diabetic polyneuropathy: Secondary | ICD-10-CM | POA: Diagnosis not present

## 2015-04-01 DIAGNOSIS — I5030 Unspecified diastolic (congestive) heart failure: Secondary | ICD-10-CM | POA: Diagnosis not present

## 2015-04-01 DIAGNOSIS — F5102 Adjustment insomnia: Secondary | ICD-10-CM | POA: Diagnosis not present

## 2015-04-01 DIAGNOSIS — E10621 Type 1 diabetes mellitus with foot ulcer: Secondary | ICD-10-CM | POA: Diagnosis not present

## 2015-04-01 DIAGNOSIS — M545 Low back pain: Secondary | ICD-10-CM | POA: Diagnosis not present

## 2015-04-01 DIAGNOSIS — E780 Pure hypercholesterolemia: Secondary | ICD-10-CM | POA: Diagnosis not present

## 2015-04-01 DIAGNOSIS — E034 Atrophy of thyroid (acquired): Secondary | ICD-10-CM | POA: Diagnosis not present

## 2015-04-01 DIAGNOSIS — R2681 Unsteadiness on feet: Secondary | ICD-10-CM | POA: Diagnosis not present

## 2015-04-01 DIAGNOSIS — L97521 Non-pressure chronic ulcer of other part of left foot limited to breakdown of skin: Secondary | ICD-10-CM | POA: Diagnosis not present

## 2015-04-07 DIAGNOSIS — R079 Chest pain, unspecified: Secondary | ICD-10-CM | POA: Diagnosis not present

## 2015-04-07 DIAGNOSIS — E785 Hyperlipidemia, unspecified: Secondary | ICD-10-CM | POA: Diagnosis not present

## 2015-04-07 DIAGNOSIS — I2510 Atherosclerotic heart disease of native coronary artery without angina pectoris: Secondary | ICD-10-CM | POA: Diagnosis not present

## 2015-04-07 DIAGNOSIS — I209 Angina pectoris, unspecified: Secondary | ICD-10-CM | POA: Diagnosis not present

## 2015-04-07 DIAGNOSIS — Z7982 Long term (current) use of aspirin: Secondary | ICD-10-CM | POA: Diagnosis not present

## 2015-04-07 DIAGNOSIS — I25110 Atherosclerotic heart disease of native coronary artery with unstable angina pectoris: Secondary | ICD-10-CM | POA: Diagnosis not present

## 2015-04-07 DIAGNOSIS — D638 Anemia in other chronic diseases classified elsewhere: Secondary | ICD-10-CM | POA: Diagnosis not present

## 2015-04-07 DIAGNOSIS — I509 Heart failure, unspecified: Secondary | ICD-10-CM | POA: Diagnosis not present

## 2015-04-07 DIAGNOSIS — Z87891 Personal history of nicotine dependence: Secondary | ICD-10-CM | POA: Diagnosis not present

## 2015-04-07 DIAGNOSIS — I5032 Chronic diastolic (congestive) heart failure: Secondary | ICD-10-CM | POA: Diagnosis not present

## 2015-04-07 DIAGNOSIS — E119 Type 2 diabetes mellitus without complications: Secondary | ICD-10-CM | POA: Diagnosis not present

## 2015-04-07 DIAGNOSIS — I110 Hypertensive heart disease with heart failure: Secondary | ICD-10-CM | POA: Diagnosis not present

## 2015-04-07 DIAGNOSIS — K3184 Gastroparesis: Secondary | ICD-10-CM | POA: Diagnosis not present

## 2015-04-07 DIAGNOSIS — Z79899 Other long term (current) drug therapy: Secondary | ICD-10-CM | POA: Diagnosis not present

## 2015-04-08 DIAGNOSIS — I209 Angina pectoris, unspecified: Secondary | ICD-10-CM | POA: Diagnosis not present

## 2015-04-08 DIAGNOSIS — I5042 Chronic combined systolic (congestive) and diastolic (congestive) heart failure: Secondary | ICD-10-CM | POA: Diagnosis not present

## 2015-04-08 DIAGNOSIS — I25118 Atherosclerotic heart disease of native coronary artery with other forms of angina pectoris: Secondary | ICD-10-CM | POA: Diagnosis not present

## 2015-04-08 DIAGNOSIS — E785 Hyperlipidemia, unspecified: Secondary | ICD-10-CM | POA: Diagnosis not present

## 2015-04-08 DIAGNOSIS — I5032 Chronic diastolic (congestive) heart failure: Secondary | ICD-10-CM | POA: Diagnosis not present

## 2015-04-08 DIAGNOSIS — D649 Anemia, unspecified: Secondary | ICD-10-CM | POA: Diagnosis not present

## 2015-04-08 DIAGNOSIS — D638 Anemia in other chronic diseases classified elsewhere: Secondary | ICD-10-CM | POA: Diagnosis not present

## 2015-04-08 DIAGNOSIS — Z9861 Coronary angioplasty status: Secondary | ICD-10-CM | POA: Diagnosis not present

## 2015-04-08 DIAGNOSIS — E119 Type 2 diabetes mellitus without complications: Secondary | ICD-10-CM | POA: Diagnosis not present

## 2015-04-08 DIAGNOSIS — Z87891 Personal history of nicotine dependence: Secondary | ICD-10-CM | POA: Diagnosis not present

## 2015-04-09 DIAGNOSIS — E785 Hyperlipidemia, unspecified: Secondary | ICD-10-CM | POA: Diagnosis not present

## 2015-04-09 DIAGNOSIS — I209 Angina pectoris, unspecified: Secondary | ICD-10-CM | POA: Diagnosis not present

## 2015-04-09 DIAGNOSIS — I25118 Atherosclerotic heart disease of native coronary artery with other forms of angina pectoris: Secondary | ICD-10-CM | POA: Diagnosis not present

## 2015-04-09 DIAGNOSIS — E119 Type 2 diabetes mellitus without complications: Secondary | ICD-10-CM | POA: Diagnosis not present

## 2015-04-09 DIAGNOSIS — Z9861 Coronary angioplasty status: Secondary | ICD-10-CM | POA: Diagnosis not present

## 2015-04-09 DIAGNOSIS — D638 Anemia in other chronic diseases classified elsewhere: Secondary | ICD-10-CM | POA: Diagnosis not present

## 2015-04-09 DIAGNOSIS — Z87891 Personal history of nicotine dependence: Secondary | ICD-10-CM | POA: Diagnosis not present

## 2015-04-09 DIAGNOSIS — I5032 Chronic diastolic (congestive) heart failure: Secondary | ICD-10-CM | POA: Diagnosis not present

## 2015-04-10 DIAGNOSIS — Z780 Asymptomatic menopausal state: Secondary | ICD-10-CM | POA: Diagnosis not present

## 2015-04-10 DIAGNOSIS — Z90710 Acquired absence of both cervix and uterus: Secondary | ICD-10-CM | POA: Diagnosis not present

## 2015-04-10 DIAGNOSIS — Z91013 Allergy to seafood: Secondary | ICD-10-CM | POA: Diagnosis not present

## 2015-04-10 DIAGNOSIS — Z9889 Other specified postprocedural states: Secondary | ICD-10-CM | POA: Diagnosis not present

## 2015-04-10 DIAGNOSIS — Z89422 Acquired absence of other left toe(s): Secondary | ICD-10-CM | POA: Diagnosis not present

## 2015-04-10 DIAGNOSIS — F329 Major depressive disorder, single episode, unspecified: Secondary | ICD-10-CM | POA: Diagnosis not present

## 2015-04-10 DIAGNOSIS — Z7982 Long term (current) use of aspirin: Secondary | ICD-10-CM | POA: Diagnosis not present

## 2015-04-10 DIAGNOSIS — Z89412 Acquired absence of left great toe: Secondary | ICD-10-CM | POA: Diagnosis not present

## 2015-04-10 DIAGNOSIS — Z8711 Personal history of peptic ulcer disease: Secondary | ICD-10-CM | POA: Diagnosis not present

## 2015-04-10 DIAGNOSIS — Z9841 Cataract extraction status, right eye: Secondary | ICD-10-CM | POA: Diagnosis not present

## 2015-04-10 DIAGNOSIS — Z885 Allergy status to narcotic agent status: Secondary | ICD-10-CM | POA: Diagnosis not present

## 2015-04-10 DIAGNOSIS — I10 Essential (primary) hypertension: Secondary | ICD-10-CM | POA: Diagnosis not present

## 2015-04-10 DIAGNOSIS — Z794 Long term (current) use of insulin: Secondary | ICD-10-CM | POA: Diagnosis not present

## 2015-04-10 DIAGNOSIS — Z9842 Cataract extraction status, left eye: Secondary | ICD-10-CM | POA: Diagnosis not present

## 2015-04-10 DIAGNOSIS — Z9049 Acquired absence of other specified parts of digestive tract: Secondary | ICD-10-CM | POA: Diagnosis not present

## 2015-04-10 DIAGNOSIS — Z888 Allergy status to other drugs, medicaments and biological substances status: Secondary | ICD-10-CM | POA: Diagnosis not present

## 2015-04-10 DIAGNOSIS — S60211A Contusion of right wrist, initial encounter: Secondary | ICD-10-CM | POA: Diagnosis not present

## 2015-04-10 DIAGNOSIS — E119 Type 2 diabetes mellitus without complications: Secondary | ICD-10-CM | POA: Diagnosis not present

## 2015-04-10 DIAGNOSIS — G43909 Migraine, unspecified, not intractable, without status migrainosus: Secondary | ICD-10-CM | POA: Diagnosis not present

## 2015-04-10 DIAGNOSIS — Z87891 Personal history of nicotine dependence: Secondary | ICD-10-CM | POA: Diagnosis not present

## 2015-04-10 DIAGNOSIS — Z79899 Other long term (current) drug therapy: Secondary | ICD-10-CM | POA: Diagnosis not present

## 2015-04-10 DIAGNOSIS — M1990 Unspecified osteoarthritis, unspecified site: Secondary | ICD-10-CM | POA: Diagnosis not present

## 2015-04-10 DIAGNOSIS — E785 Hyperlipidemia, unspecified: Secondary | ICD-10-CM | POA: Diagnosis not present

## 2015-04-19 DIAGNOSIS — E109 Type 1 diabetes mellitus without complications: Secondary | ICD-10-CM | POA: Diagnosis not present

## 2015-04-19 DIAGNOSIS — E10621 Type 1 diabetes mellitus with foot ulcer: Secondary | ICD-10-CM | POA: Diagnosis not present

## 2015-04-19 DIAGNOSIS — J449 Chronic obstructive pulmonary disease, unspecified: Secondary | ICD-10-CM | POA: Diagnosis not present

## 2015-04-19 DIAGNOSIS — Z09 Encounter for follow-up examination after completed treatment for conditions other than malignant neoplasm: Secondary | ICD-10-CM | POA: Diagnosis not present

## 2015-04-19 DIAGNOSIS — Z8631 Personal history of diabetic foot ulcer: Secondary | ICD-10-CM | POA: Diagnosis not present

## 2015-04-22 DIAGNOSIS — I10 Essential (primary) hypertension: Secondary | ICD-10-CM | POA: Insufficient documentation

## 2015-04-22 DIAGNOSIS — E1142 Type 2 diabetes mellitus with diabetic polyneuropathy: Secondary | ICD-10-CM | POA: Diagnosis not present

## 2015-04-22 DIAGNOSIS — I2510 Atherosclerotic heart disease of native coronary artery without angina pectoris: Secondary | ICD-10-CM | POA: Diagnosis not present

## 2015-04-22 DIAGNOSIS — E782 Mixed hyperlipidemia: Secondary | ICD-10-CM | POA: Diagnosis not present

## 2015-05-06 DIAGNOSIS — I2510 Atherosclerotic heart disease of native coronary artery without angina pectoris: Secondary | ICD-10-CM | POA: Diagnosis not present

## 2015-05-06 DIAGNOSIS — F5102 Adjustment insomnia: Secondary | ICD-10-CM | POA: Diagnosis not present

## 2015-05-06 DIAGNOSIS — I872 Venous insufficiency (chronic) (peripheral): Secondary | ICD-10-CM | POA: Diagnosis not present

## 2015-05-06 DIAGNOSIS — E10621 Type 1 diabetes mellitus with foot ulcer: Secondary | ICD-10-CM | POA: Diagnosis not present

## 2015-05-06 DIAGNOSIS — I5030 Unspecified diastolic (congestive) heart failure: Secondary | ICD-10-CM | POA: Diagnosis not present

## 2015-05-21 DIAGNOSIS — R079 Chest pain, unspecified: Secondary | ICD-10-CM | POA: Diagnosis not present

## 2015-05-21 DIAGNOSIS — R0602 Shortness of breath: Secondary | ICD-10-CM | POA: Diagnosis not present

## 2015-05-21 DIAGNOSIS — R000 Tachycardia, unspecified: Secondary | ICD-10-CM | POA: Diagnosis not present

## 2015-05-24 DIAGNOSIS — Z9861 Coronary angioplasty status: Secondary | ICD-10-CM | POA: Diagnosis not present

## 2015-05-24 DIAGNOSIS — I2510 Atherosclerotic heart disease of native coronary artery without angina pectoris: Secondary | ICD-10-CM | POA: Diagnosis not present

## 2015-05-24 DIAGNOSIS — E119 Type 2 diabetes mellitus without complications: Secondary | ICD-10-CM | POA: Diagnosis not present

## 2015-05-24 DIAGNOSIS — I10 Essential (primary) hypertension: Secondary | ICD-10-CM | POA: Diagnosis not present

## 2015-05-24 DIAGNOSIS — Z79899 Other long term (current) drug therapy: Secondary | ICD-10-CM | POA: Diagnosis not present

## 2015-05-24 DIAGNOSIS — D649 Anemia, unspecified: Secondary | ICD-10-CM | POA: Diagnosis not present

## 2015-05-25 DIAGNOSIS — E119 Type 2 diabetes mellitus without complications: Secondary | ICD-10-CM | POA: Diagnosis not present

## 2015-05-25 DIAGNOSIS — Z9861 Coronary angioplasty status: Secondary | ICD-10-CM | POA: Diagnosis not present

## 2015-05-25 DIAGNOSIS — Z79899 Other long term (current) drug therapy: Secondary | ICD-10-CM | POA: Diagnosis not present

## 2015-05-25 DIAGNOSIS — D649 Anemia, unspecified: Secondary | ICD-10-CM | POA: Diagnosis not present

## 2015-05-25 DIAGNOSIS — I2510 Atherosclerotic heart disease of native coronary artery without angina pectoris: Secondary | ICD-10-CM | POA: Diagnosis not present

## 2015-05-25 DIAGNOSIS — I10 Essential (primary) hypertension: Secondary | ICD-10-CM | POA: Diagnosis not present

## 2015-05-27 DIAGNOSIS — I2510 Atherosclerotic heart disease of native coronary artery without angina pectoris: Secondary | ICD-10-CM | POA: Diagnosis not present

## 2015-05-27 DIAGNOSIS — E119 Type 2 diabetes mellitus without complications: Secondary | ICD-10-CM | POA: Diagnosis not present

## 2015-05-27 DIAGNOSIS — Z79899 Other long term (current) drug therapy: Secondary | ICD-10-CM | POA: Diagnosis not present

## 2015-05-27 DIAGNOSIS — D649 Anemia, unspecified: Secondary | ICD-10-CM | POA: Diagnosis not present

## 2015-05-27 DIAGNOSIS — I10 Essential (primary) hypertension: Secondary | ICD-10-CM | POA: Diagnosis not present

## 2015-05-27 DIAGNOSIS — Z9861 Coronary angioplasty status: Secondary | ICD-10-CM | POA: Diagnosis not present

## 2015-06-01 DIAGNOSIS — Z79899 Other long term (current) drug therapy: Secondary | ICD-10-CM | POA: Diagnosis not present

## 2015-06-01 DIAGNOSIS — Z9861 Coronary angioplasty status: Secondary | ICD-10-CM | POA: Diagnosis not present

## 2015-06-01 DIAGNOSIS — I10 Essential (primary) hypertension: Secondary | ICD-10-CM | POA: Diagnosis not present

## 2015-06-01 DIAGNOSIS — E119 Type 2 diabetes mellitus without complications: Secondary | ICD-10-CM | POA: Diagnosis not present

## 2015-06-01 DIAGNOSIS — I25119 Atherosclerotic heart disease of native coronary artery with unspecified angina pectoris: Secondary | ICD-10-CM | POA: Diagnosis not present

## 2015-06-01 DIAGNOSIS — E782 Mixed hyperlipidemia: Secondary | ICD-10-CM | POA: Diagnosis not present

## 2015-06-01 DIAGNOSIS — D649 Anemia, unspecified: Secondary | ICD-10-CM | POA: Diagnosis not present

## 2015-06-01 DIAGNOSIS — I2510 Atherosclerotic heart disease of native coronary artery without angina pectoris: Secondary | ICD-10-CM | POA: Diagnosis not present

## 2015-06-07 DIAGNOSIS — L738 Other specified follicular disorders: Secondary | ICD-10-CM | POA: Diagnosis not present

## 2015-06-08 DIAGNOSIS — Z9861 Coronary angioplasty status: Secondary | ICD-10-CM | POA: Diagnosis not present

## 2015-06-08 DIAGNOSIS — I10 Essential (primary) hypertension: Secondary | ICD-10-CM | POA: Diagnosis not present

## 2015-06-08 DIAGNOSIS — I2510 Atherosclerotic heart disease of native coronary artery without angina pectoris: Secondary | ICD-10-CM | POA: Diagnosis not present

## 2015-06-08 DIAGNOSIS — E119 Type 2 diabetes mellitus without complications: Secondary | ICD-10-CM | POA: Diagnosis not present

## 2015-06-08 DIAGNOSIS — D649 Anemia, unspecified: Secondary | ICD-10-CM | POA: Diagnosis not present

## 2015-06-08 DIAGNOSIS — Z79899 Other long term (current) drug therapy: Secondary | ICD-10-CM | POA: Diagnosis not present

## 2015-06-14 DIAGNOSIS — Z780 Asymptomatic menopausal state: Secondary | ICD-10-CM | POA: Diagnosis not present

## 2015-06-14 DIAGNOSIS — R079 Chest pain, unspecified: Secondary | ICD-10-CM | POA: Diagnosis not present

## 2015-06-14 DIAGNOSIS — R110 Nausea: Secondary | ICD-10-CM | POA: Diagnosis not present

## 2015-06-14 DIAGNOSIS — Z955 Presence of coronary angioplasty implant and graft: Secondary | ICD-10-CM | POA: Diagnosis not present

## 2015-06-14 DIAGNOSIS — J449 Chronic obstructive pulmonary disease, unspecified: Secondary | ICD-10-CM | POA: Diagnosis not present

## 2015-06-14 DIAGNOSIS — Z8249 Family history of ischemic heart disease and other diseases of the circulatory system: Secondary | ICD-10-CM | POA: Diagnosis not present

## 2015-06-14 DIAGNOSIS — Z89422 Acquired absence of other left toe(s): Secondary | ICD-10-CM | POA: Diagnosis not present

## 2015-06-14 DIAGNOSIS — Z79899 Other long term (current) drug therapy: Secondary | ICD-10-CM | POA: Diagnosis not present

## 2015-06-14 DIAGNOSIS — I2510 Atherosclerotic heart disease of native coronary artery without angina pectoris: Secondary | ICD-10-CM | POA: Diagnosis not present

## 2015-06-14 DIAGNOSIS — R0602 Shortness of breath: Secondary | ICD-10-CM | POA: Diagnosis not present

## 2015-06-14 DIAGNOSIS — R0789 Other chest pain: Secondary | ICD-10-CM | POA: Diagnosis not present

## 2015-06-14 DIAGNOSIS — R002 Palpitations: Secondary | ICD-10-CM | POA: Diagnosis not present

## 2015-06-14 DIAGNOSIS — E1142 Type 2 diabetes mellitus with diabetic polyneuropathy: Secondary | ICD-10-CM | POA: Diagnosis not present

## 2015-06-14 DIAGNOSIS — J9811 Atelectasis: Secondary | ICD-10-CM | POA: Diagnosis not present

## 2015-06-14 DIAGNOSIS — Z87891 Personal history of nicotine dependence: Secondary | ICD-10-CM | POA: Diagnosis not present

## 2015-06-14 DIAGNOSIS — I10 Essential (primary) hypertension: Secondary | ICD-10-CM | POA: Diagnosis not present

## 2015-06-14 DIAGNOSIS — Z885 Allergy status to narcotic agent status: Secondary | ICD-10-CM | POA: Diagnosis not present

## 2015-06-14 DIAGNOSIS — E782 Mixed hyperlipidemia: Secondary | ICD-10-CM | POA: Diagnosis not present

## 2015-06-15 DIAGNOSIS — I10 Essential (primary) hypertension: Secondary | ICD-10-CM | POA: Diagnosis not present

## 2015-06-15 DIAGNOSIS — R079 Chest pain, unspecified: Secondary | ICD-10-CM | POA: Diagnosis not present

## 2015-06-15 DIAGNOSIS — I2510 Atherosclerotic heart disease of native coronary artery without angina pectoris: Secondary | ICD-10-CM | POA: Diagnosis not present

## 2015-06-15 DIAGNOSIS — J449 Chronic obstructive pulmonary disease, unspecified: Secondary | ICD-10-CM | POA: Diagnosis not present

## 2015-06-22 DIAGNOSIS — E782 Mixed hyperlipidemia: Secondary | ICD-10-CM | POA: Diagnosis not present

## 2015-06-22 DIAGNOSIS — I2510 Atherosclerotic heart disease of native coronary artery without angina pectoris: Secondary | ICD-10-CM | POA: Diagnosis not present

## 2015-06-22 DIAGNOSIS — I110 Hypertensive heart disease with heart failure: Secondary | ICD-10-CM | POA: Diagnosis not present

## 2015-06-24 DIAGNOSIS — I6522 Occlusion and stenosis of left carotid artery: Secondary | ICD-10-CM | POA: Diagnosis not present

## 2015-06-24 DIAGNOSIS — E785 Hyperlipidemia, unspecified: Secondary | ICD-10-CM | POA: Diagnosis not present

## 2015-06-24 DIAGNOSIS — Z87891 Personal history of nicotine dependence: Secondary | ICD-10-CM | POA: Diagnosis not present

## 2015-06-24 DIAGNOSIS — Z794 Long term (current) use of insulin: Secondary | ICD-10-CM | POA: Diagnosis not present

## 2015-06-24 DIAGNOSIS — I10 Essential (primary) hypertension: Secondary | ICD-10-CM | POA: Diagnosis not present

## 2015-06-24 DIAGNOSIS — G8929 Other chronic pain: Secondary | ICD-10-CM | POA: Diagnosis not present

## 2015-06-24 DIAGNOSIS — I2510 Atherosclerotic heart disease of native coronary artery without angina pectoris: Secondary | ICD-10-CM | POA: Diagnosis not present

## 2015-06-24 DIAGNOSIS — E871 Hypo-osmolality and hyponatremia: Secondary | ICD-10-CM | POA: Diagnosis not present

## 2015-06-24 DIAGNOSIS — I4891 Unspecified atrial fibrillation: Secondary | ICD-10-CM | POA: Diagnosis not present

## 2015-06-24 DIAGNOSIS — I509 Heart failure, unspecified: Secondary | ICD-10-CM | POA: Diagnosis not present

## 2015-06-24 DIAGNOSIS — E119 Type 2 diabetes mellitus without complications: Secondary | ICD-10-CM | POA: Diagnosis not present

## 2015-06-24 DIAGNOSIS — D649 Anemia, unspecified: Secondary | ICD-10-CM | POA: Diagnosis not present

## 2015-06-24 DIAGNOSIS — Z79899 Other long term (current) drug therapy: Secondary | ICD-10-CM | POA: Diagnosis not present

## 2015-06-25 DIAGNOSIS — E785 Hyperlipidemia, unspecified: Secondary | ICD-10-CM | POA: Diagnosis not present

## 2015-06-25 DIAGNOSIS — I509 Heart failure, unspecified: Secondary | ICD-10-CM | POA: Diagnosis not present

## 2015-06-25 DIAGNOSIS — Z955 Presence of coronary angioplasty implant and graft: Secondary | ICD-10-CM | POA: Diagnosis not present

## 2015-06-25 DIAGNOSIS — I4891 Unspecified atrial fibrillation: Secondary | ICD-10-CM | POA: Diagnosis not present

## 2015-06-25 DIAGNOSIS — I2510 Atherosclerotic heart disease of native coronary artery without angina pectoris: Secondary | ICD-10-CM | POA: Diagnosis not present

## 2015-06-25 DIAGNOSIS — G8929 Other chronic pain: Secondary | ICD-10-CM | POA: Diagnosis not present

## 2015-06-25 DIAGNOSIS — I480 Paroxysmal atrial fibrillation: Secondary | ICD-10-CM | POA: Diagnosis not present

## 2015-06-25 DIAGNOSIS — I10 Essential (primary) hypertension: Secondary | ICD-10-CM | POA: Diagnosis not present

## 2015-06-25 DIAGNOSIS — D649 Anemia, unspecified: Secondary | ICD-10-CM | POA: Diagnosis not present

## 2015-06-25 DIAGNOSIS — I6522 Occlusion and stenosis of left carotid artery: Secondary | ICD-10-CM | POA: Diagnosis not present

## 2015-07-06 DIAGNOSIS — I480 Paroxysmal atrial fibrillation: Secondary | ICD-10-CM | POA: Insufficient documentation

## 2015-07-07 DIAGNOSIS — I5032 Chronic diastolic (congestive) heart failure: Secondary | ICD-10-CM | POA: Diagnosis not present

## 2015-07-07 DIAGNOSIS — I10 Essential (primary) hypertension: Secondary | ICD-10-CM | POA: Diagnosis not present

## 2015-07-07 DIAGNOSIS — I480 Paroxysmal atrial fibrillation: Secondary | ICD-10-CM | POA: Diagnosis not present

## 2015-07-07 DIAGNOSIS — E782 Mixed hyperlipidemia: Secondary | ICD-10-CM | POA: Diagnosis not present

## 2015-07-07 DIAGNOSIS — I2510 Atherosclerotic heart disease of native coronary artery without angina pectoris: Secondary | ICD-10-CM | POA: Diagnosis not present

## 2015-07-15 DIAGNOSIS — E038 Other specified hypothyroidism: Secondary | ICD-10-CM | POA: Diagnosis not present

## 2015-07-15 DIAGNOSIS — E782 Mixed hyperlipidemia: Secondary | ICD-10-CM | POA: Diagnosis not present

## 2015-07-15 DIAGNOSIS — R0902 Hypoxemia: Secondary | ICD-10-CM | POA: Diagnosis not present

## 2015-07-15 DIAGNOSIS — R600 Localized edema: Secondary | ICD-10-CM | POA: Diagnosis not present

## 2015-07-15 DIAGNOSIS — F5102 Adjustment insomnia: Secondary | ICD-10-CM | POA: Diagnosis not present

## 2015-07-15 DIAGNOSIS — M545 Low back pain: Secondary | ICD-10-CM | POA: Diagnosis not present

## 2015-07-15 DIAGNOSIS — I10 Essential (primary) hypertension: Secondary | ICD-10-CM | POA: Diagnosis not present

## 2015-07-15 DIAGNOSIS — E1142 Type 2 diabetes mellitus with diabetic polyneuropathy: Secondary | ICD-10-CM | POA: Diagnosis not present

## 2015-07-15 DIAGNOSIS — I4891 Unspecified atrial fibrillation: Secondary | ICD-10-CM | POA: Diagnosis not present

## 2015-07-15 DIAGNOSIS — I2510 Atherosclerotic heart disease of native coronary artery without angina pectoris: Secondary | ICD-10-CM | POA: Diagnosis not present

## 2015-07-15 DIAGNOSIS — I5030 Unspecified diastolic (congestive) heart failure: Secondary | ICD-10-CM | POA: Diagnosis not present

## 2015-07-27 DIAGNOSIS — F329 Major depressive disorder, single episode, unspecified: Secondary | ICD-10-CM | POA: Diagnosis not present

## 2015-07-27 DIAGNOSIS — Z89422 Acquired absence of other left toe(s): Secondary | ICD-10-CM | POA: Diagnosis not present

## 2015-07-27 DIAGNOSIS — Z87891 Personal history of nicotine dependence: Secondary | ICD-10-CM | POA: Diagnosis not present

## 2015-07-27 DIAGNOSIS — I2510 Atherosclerotic heart disease of native coronary artery without angina pectoris: Secondary | ICD-10-CM | POA: Diagnosis not present

## 2015-07-27 DIAGNOSIS — J9811 Atelectasis: Secondary | ICD-10-CM | POA: Diagnosis not present

## 2015-07-27 DIAGNOSIS — Z885 Allergy status to narcotic agent status: Secondary | ICD-10-CM | POA: Diagnosis not present

## 2015-07-27 DIAGNOSIS — Z79899 Other long term (current) drug therapy: Secondary | ICD-10-CM | POA: Diagnosis not present

## 2015-07-27 DIAGNOSIS — E119 Type 2 diabetes mellitus without complications: Secondary | ICD-10-CM | POA: Diagnosis not present

## 2015-07-27 DIAGNOSIS — R2689 Other abnormalities of gait and mobility: Secondary | ICD-10-CM | POA: Diagnosis not present

## 2015-07-27 DIAGNOSIS — E1142 Type 2 diabetes mellitus with diabetic polyneuropathy: Secondary | ICD-10-CM | POA: Diagnosis not present

## 2015-07-27 DIAGNOSIS — Z780 Asymptomatic menopausal state: Secondary | ICD-10-CM | POA: Diagnosis not present

## 2015-07-27 DIAGNOSIS — I951 Orthostatic hypotension: Secondary | ICD-10-CM | POA: Diagnosis not present

## 2015-07-27 DIAGNOSIS — Z7982 Long term (current) use of aspirin: Secondary | ICD-10-CM | POA: Diagnosis not present

## 2015-07-27 DIAGNOSIS — I5032 Chronic diastolic (congestive) heart failure: Secondary | ICD-10-CM | POA: Diagnosis not present

## 2015-07-27 DIAGNOSIS — Z955 Presence of coronary angioplasty implant and graft: Secondary | ICD-10-CM | POA: Diagnosis not present

## 2015-07-27 DIAGNOSIS — I480 Paroxysmal atrial fibrillation: Secondary | ICD-10-CM | POA: Diagnosis not present

## 2015-07-27 DIAGNOSIS — I10 Essential (primary) hypertension: Secondary | ICD-10-CM | POA: Diagnosis not present

## 2015-07-27 DIAGNOSIS — R42 Dizziness and giddiness: Secondary | ICD-10-CM | POA: Diagnosis not present

## 2015-07-27 DIAGNOSIS — I4891 Unspecified atrial fibrillation: Secondary | ICD-10-CM | POA: Diagnosis not present

## 2015-07-27 DIAGNOSIS — R0602 Shortness of breath: Secondary | ICD-10-CM | POA: Diagnosis not present

## 2015-07-27 DIAGNOSIS — E785 Hyperlipidemia, unspecified: Secondary | ICD-10-CM | POA: Diagnosis not present

## 2015-07-27 DIAGNOSIS — J449 Chronic obstructive pulmonary disease, unspecified: Secondary | ICD-10-CM | POA: Diagnosis not present

## 2015-07-28 DIAGNOSIS — I2510 Atherosclerotic heart disease of native coronary artery without angina pectoris: Secondary | ICD-10-CM | POA: Diagnosis not present

## 2015-07-28 DIAGNOSIS — I10 Essential (primary) hypertension: Secondary | ICD-10-CM | POA: Diagnosis not present

## 2015-07-28 DIAGNOSIS — I951 Orthostatic hypotension: Secondary | ICD-10-CM | POA: Diagnosis not present

## 2015-07-28 DIAGNOSIS — I5032 Chronic diastolic (congestive) heart failure: Secondary | ICD-10-CM | POA: Diagnosis not present

## 2015-07-28 DIAGNOSIS — E119 Type 2 diabetes mellitus without complications: Secondary | ICD-10-CM | POA: Diagnosis not present

## 2015-07-28 DIAGNOSIS — I4891 Unspecified atrial fibrillation: Secondary | ICD-10-CM | POA: Diagnosis not present

## 2015-07-28 DIAGNOSIS — R600 Localized edema: Secondary | ICD-10-CM | POA: Diagnosis not present

## 2015-07-29 DIAGNOSIS — I959 Hypotension, unspecified: Secondary | ICD-10-CM | POA: Diagnosis not present

## 2015-07-29 DIAGNOSIS — I480 Paroxysmal atrial fibrillation: Secondary | ICD-10-CM | POA: Diagnosis not present

## 2015-07-29 DIAGNOSIS — I5032 Chronic diastolic (congestive) heart failure: Secondary | ICD-10-CM | POA: Diagnosis not present

## 2015-07-29 DIAGNOSIS — I2510 Atherosclerotic heart disease of native coronary artery without angina pectoris: Secondary | ICD-10-CM | POA: Diagnosis not present

## 2015-07-29 DIAGNOSIS — J449 Chronic obstructive pulmonary disease, unspecified: Secondary | ICD-10-CM | POA: Diagnosis not present

## 2015-07-29 DIAGNOSIS — E119 Type 2 diabetes mellitus without complications: Secondary | ICD-10-CM | POA: Diagnosis not present

## 2015-07-29 DIAGNOSIS — I951 Orthostatic hypotension: Secondary | ICD-10-CM | POA: Diagnosis not present

## 2015-07-29 DIAGNOSIS — I10 Essential (primary) hypertension: Secondary | ICD-10-CM | POA: Diagnosis not present

## 2015-07-30 DIAGNOSIS — I951 Orthostatic hypotension: Secondary | ICD-10-CM | POA: Diagnosis not present

## 2015-07-30 DIAGNOSIS — E869 Volume depletion, unspecified: Secondary | ICD-10-CM | POA: Diagnosis not present

## 2015-07-30 DIAGNOSIS — E1143 Type 2 diabetes mellitus with diabetic autonomic (poly)neuropathy: Secondary | ICD-10-CM | POA: Diagnosis not present

## 2015-07-30 DIAGNOSIS — I10 Essential (primary) hypertension: Secondary | ICD-10-CM | POA: Diagnosis not present

## 2015-08-02 DIAGNOSIS — E7800 Pure hypercholesterolemia, unspecified: Secondary | ICD-10-CM | POA: Diagnosis not present

## 2015-08-02 DIAGNOSIS — Z794 Long term (current) use of insulin: Secondary | ICD-10-CM | POA: Diagnosis not present

## 2015-08-02 DIAGNOSIS — R112 Nausea with vomiting, unspecified: Secondary | ICD-10-CM | POA: Diagnosis not present

## 2015-08-02 DIAGNOSIS — J449 Chronic obstructive pulmonary disease, unspecified: Secondary | ICD-10-CM | POA: Diagnosis not present

## 2015-08-02 DIAGNOSIS — R197 Diarrhea, unspecified: Secondary | ICD-10-CM | POA: Diagnosis not present

## 2015-08-02 DIAGNOSIS — E11649 Type 2 diabetes mellitus with hypoglycemia without coma: Secondary | ICD-10-CM | POA: Diagnosis not present

## 2015-08-02 DIAGNOSIS — R404 Transient alteration of awareness: Secondary | ICD-10-CM | POA: Diagnosis not present

## 2015-08-02 DIAGNOSIS — R7309 Other abnormal glucose: Secondary | ICD-10-CM | POA: Diagnosis not present

## 2015-08-02 DIAGNOSIS — I10 Essential (primary) hypertension: Secondary | ICD-10-CM | POA: Diagnosis not present

## 2015-08-02 DIAGNOSIS — Z79899 Other long term (current) drug therapy: Secondary | ICD-10-CM | POA: Diagnosis not present

## 2015-08-02 DIAGNOSIS — K219 Gastro-esophageal reflux disease without esophagitis: Secondary | ICD-10-CM | POA: Diagnosis not present

## 2015-08-02 DIAGNOSIS — E161 Other hypoglycemia: Secondary | ICD-10-CM | POA: Diagnosis not present

## 2015-08-03 DIAGNOSIS — I129 Hypertensive chronic kidney disease with stage 1 through stage 4 chronic kidney disease, or unspecified chronic kidney disease: Secondary | ICD-10-CM | POA: Diagnosis not present

## 2015-08-03 DIAGNOSIS — E11649 Type 2 diabetes mellitus with hypoglycemia without coma: Secondary | ICD-10-CM | POA: Diagnosis not present

## 2015-08-03 DIAGNOSIS — Z7984 Long term (current) use of oral hypoglycemic drugs: Secondary | ICD-10-CM | POA: Diagnosis not present

## 2015-08-03 DIAGNOSIS — Z87891 Personal history of nicotine dependence: Secondary | ICD-10-CM | POA: Diagnosis not present

## 2015-08-03 DIAGNOSIS — Z89421 Acquired absence of other right toe(s): Secondary | ICD-10-CM | POA: Diagnosis not present

## 2015-08-03 DIAGNOSIS — N189 Chronic kidney disease, unspecified: Secondary | ICD-10-CM | POA: Diagnosis not present

## 2015-08-03 DIAGNOSIS — I509 Heart failure, unspecified: Secondary | ICD-10-CM | POA: Diagnosis not present

## 2015-08-03 DIAGNOSIS — J449 Chronic obstructive pulmonary disease, unspecified: Secondary | ICD-10-CM | POA: Diagnosis not present

## 2015-08-03 DIAGNOSIS — I2510 Atherosclerotic heart disease of native coronary artery without angina pectoris: Secondary | ICD-10-CM | POA: Diagnosis not present

## 2015-08-04 DIAGNOSIS — I2510 Atherosclerotic heart disease of native coronary artery without angina pectoris: Secondary | ICD-10-CM | POA: Diagnosis not present

## 2015-08-04 DIAGNOSIS — J449 Chronic obstructive pulmonary disease, unspecified: Secondary | ICD-10-CM | POA: Diagnosis not present

## 2015-08-04 DIAGNOSIS — I129 Hypertensive chronic kidney disease with stage 1 through stage 4 chronic kidney disease, or unspecified chronic kidney disease: Secondary | ICD-10-CM | POA: Diagnosis not present

## 2015-08-04 DIAGNOSIS — I509 Heart failure, unspecified: Secondary | ICD-10-CM | POA: Diagnosis not present

## 2015-08-04 DIAGNOSIS — E11649 Type 2 diabetes mellitus with hypoglycemia without coma: Secondary | ICD-10-CM | POA: Diagnosis not present

## 2015-08-04 DIAGNOSIS — N189 Chronic kidney disease, unspecified: Secondary | ICD-10-CM | POA: Diagnosis not present

## 2015-08-05 DIAGNOSIS — R600 Localized edema: Secondary | ICD-10-CM | POA: Diagnosis not present

## 2015-08-05 DIAGNOSIS — R0609 Other forms of dyspnea: Secondary | ICD-10-CM | POA: Diagnosis not present

## 2015-08-05 DIAGNOSIS — I2510 Atherosclerotic heart disease of native coronary artery without angina pectoris: Secondary | ICD-10-CM | POA: Diagnosis not present

## 2015-08-05 DIAGNOSIS — I480 Paroxysmal atrial fibrillation: Secondary | ICD-10-CM | POA: Diagnosis not present

## 2015-08-05 DIAGNOSIS — R42 Dizziness and giddiness: Secondary | ICD-10-CM | POA: Diagnosis not present

## 2015-08-05 DIAGNOSIS — I5032 Chronic diastolic (congestive) heart failure: Secondary | ICD-10-CM | POA: Diagnosis not present

## 2015-08-08 DIAGNOSIS — E11649 Type 2 diabetes mellitus with hypoglycemia without coma: Secondary | ICD-10-CM | POA: Diagnosis not present

## 2015-08-08 DIAGNOSIS — N189 Chronic kidney disease, unspecified: Secondary | ICD-10-CM | POA: Diagnosis not present

## 2015-08-08 DIAGNOSIS — I2510 Atherosclerotic heart disease of native coronary artery without angina pectoris: Secondary | ICD-10-CM | POA: Diagnosis not present

## 2015-08-08 DIAGNOSIS — I129 Hypertensive chronic kidney disease with stage 1 through stage 4 chronic kidney disease, or unspecified chronic kidney disease: Secondary | ICD-10-CM | POA: Diagnosis not present

## 2015-08-08 DIAGNOSIS — I509 Heart failure, unspecified: Secondary | ICD-10-CM | POA: Diagnosis not present

## 2015-08-08 DIAGNOSIS — J449 Chronic obstructive pulmonary disease, unspecified: Secondary | ICD-10-CM | POA: Diagnosis not present

## 2015-08-10 DIAGNOSIS — R0789 Other chest pain: Secondary | ICD-10-CM | POA: Diagnosis not present

## 2015-08-10 DIAGNOSIS — E08649 Diabetes mellitus due to underlying condition with hypoglycemia without coma: Secondary | ICD-10-CM | POA: Diagnosis not present

## 2015-08-11 DIAGNOSIS — N189 Chronic kidney disease, unspecified: Secondary | ICD-10-CM | POA: Diagnosis not present

## 2015-08-11 DIAGNOSIS — I509 Heart failure, unspecified: Secondary | ICD-10-CM | POA: Diagnosis not present

## 2015-08-11 DIAGNOSIS — I2510 Atherosclerotic heart disease of native coronary artery without angina pectoris: Secondary | ICD-10-CM | POA: Diagnosis not present

## 2015-08-11 DIAGNOSIS — J449 Chronic obstructive pulmonary disease, unspecified: Secondary | ICD-10-CM | POA: Diagnosis not present

## 2015-08-11 DIAGNOSIS — I129 Hypertensive chronic kidney disease with stage 1 through stage 4 chronic kidney disease, or unspecified chronic kidney disease: Secondary | ICD-10-CM | POA: Diagnosis not present

## 2015-08-11 DIAGNOSIS — E11649 Type 2 diabetes mellitus with hypoglycemia without coma: Secondary | ICD-10-CM | POA: Diagnosis not present

## 2015-08-12 DIAGNOSIS — E161 Other hypoglycemia: Secondary | ICD-10-CM | POA: Diagnosis not present

## 2015-08-12 DIAGNOSIS — R404 Transient alteration of awareness: Secondary | ICD-10-CM | POA: Diagnosis not present

## 2015-08-12 DIAGNOSIS — R7309 Other abnormal glucose: Secondary | ICD-10-CM | POA: Diagnosis not present

## 2015-08-15 DIAGNOSIS — I509 Heart failure, unspecified: Secondary | ICD-10-CM | POA: Diagnosis not present

## 2015-08-15 DIAGNOSIS — E11649 Type 2 diabetes mellitus with hypoglycemia without coma: Secondary | ICD-10-CM | POA: Diagnosis not present

## 2015-08-15 DIAGNOSIS — J449 Chronic obstructive pulmonary disease, unspecified: Secondary | ICD-10-CM | POA: Diagnosis not present

## 2015-08-15 DIAGNOSIS — N189 Chronic kidney disease, unspecified: Secondary | ICD-10-CM | POA: Diagnosis not present

## 2015-08-15 DIAGNOSIS — I2510 Atherosclerotic heart disease of native coronary artery without angina pectoris: Secondary | ICD-10-CM | POA: Diagnosis not present

## 2015-08-15 DIAGNOSIS — I129 Hypertensive chronic kidney disease with stage 1 through stage 4 chronic kidney disease, or unspecified chronic kidney disease: Secondary | ICD-10-CM | POA: Diagnosis not present

## 2015-08-18 DIAGNOSIS — N189 Chronic kidney disease, unspecified: Secondary | ICD-10-CM | POA: Diagnosis not present

## 2015-08-18 DIAGNOSIS — I2510 Atherosclerotic heart disease of native coronary artery without angina pectoris: Secondary | ICD-10-CM | POA: Diagnosis not present

## 2015-08-18 DIAGNOSIS — I509 Heart failure, unspecified: Secondary | ICD-10-CM | POA: Diagnosis not present

## 2015-08-18 DIAGNOSIS — J449 Chronic obstructive pulmonary disease, unspecified: Secondary | ICD-10-CM | POA: Diagnosis not present

## 2015-08-18 DIAGNOSIS — E11649 Type 2 diabetes mellitus with hypoglycemia without coma: Secondary | ICD-10-CM | POA: Diagnosis not present

## 2015-08-18 DIAGNOSIS — E1040 Type 1 diabetes mellitus with diabetic neuropathy, unspecified: Secondary | ICD-10-CM | POA: Diagnosis not present

## 2015-08-18 DIAGNOSIS — E1065 Type 1 diabetes mellitus with hyperglycemia: Secondary | ICD-10-CM | POA: Diagnosis not present

## 2015-08-18 DIAGNOSIS — Z794 Long term (current) use of insulin: Secondary | ICD-10-CM | POA: Diagnosis not present

## 2015-08-18 DIAGNOSIS — I129 Hypertensive chronic kidney disease with stage 1 through stage 4 chronic kidney disease, or unspecified chronic kidney disease: Secondary | ICD-10-CM | POA: Diagnosis not present

## 2015-08-22 DIAGNOSIS — E11649 Type 2 diabetes mellitus with hypoglycemia without coma: Secondary | ICD-10-CM | POA: Diagnosis not present

## 2015-08-22 DIAGNOSIS — J449 Chronic obstructive pulmonary disease, unspecified: Secondary | ICD-10-CM | POA: Diagnosis not present

## 2015-08-22 DIAGNOSIS — I2510 Atherosclerotic heart disease of native coronary artery without angina pectoris: Secondary | ICD-10-CM | POA: Diagnosis not present

## 2015-08-22 DIAGNOSIS — N189 Chronic kidney disease, unspecified: Secondary | ICD-10-CM | POA: Diagnosis not present

## 2015-08-22 DIAGNOSIS — I129 Hypertensive chronic kidney disease with stage 1 through stage 4 chronic kidney disease, or unspecified chronic kidney disease: Secondary | ICD-10-CM | POA: Diagnosis not present

## 2015-08-22 DIAGNOSIS — I509 Heart failure, unspecified: Secondary | ICD-10-CM | POA: Diagnosis not present

## 2015-08-25 DIAGNOSIS — I480 Paroxysmal atrial fibrillation: Secondary | ICD-10-CM | POA: Diagnosis not present

## 2015-08-25 DIAGNOSIS — I5032 Chronic diastolic (congestive) heart failure: Secondary | ICD-10-CM | POA: Diagnosis not present

## 2015-08-25 DIAGNOSIS — I2510 Atherosclerotic heart disease of native coronary artery without angina pectoris: Secondary | ICD-10-CM | POA: Diagnosis not present

## 2015-08-25 DIAGNOSIS — I10 Essential (primary) hypertension: Secondary | ICD-10-CM | POA: Diagnosis not present

## 2015-08-25 DIAGNOSIS — E782 Mixed hyperlipidemia: Secondary | ICD-10-CM | POA: Diagnosis not present

## 2015-08-27 DIAGNOSIS — N189 Chronic kidney disease, unspecified: Secondary | ICD-10-CM | POA: Diagnosis not present

## 2015-08-27 DIAGNOSIS — I509 Heart failure, unspecified: Secondary | ICD-10-CM | POA: Diagnosis not present

## 2015-08-27 DIAGNOSIS — E11649 Type 2 diabetes mellitus with hypoglycemia without coma: Secondary | ICD-10-CM | POA: Diagnosis not present

## 2015-08-27 DIAGNOSIS — I2510 Atherosclerotic heart disease of native coronary artery without angina pectoris: Secondary | ICD-10-CM | POA: Diagnosis not present

## 2015-08-27 DIAGNOSIS — I129 Hypertensive chronic kidney disease with stage 1 through stage 4 chronic kidney disease, or unspecified chronic kidney disease: Secondary | ICD-10-CM | POA: Diagnosis not present

## 2015-08-27 DIAGNOSIS — J449 Chronic obstructive pulmonary disease, unspecified: Secondary | ICD-10-CM | POA: Diagnosis not present

## 2015-08-30 DIAGNOSIS — N170 Acute kidney failure with tubular necrosis: Secondary | ICD-10-CM | POA: Diagnosis not present

## 2015-08-30 DIAGNOSIS — Z87891 Personal history of nicotine dependence: Secondary | ICD-10-CM | POA: Diagnosis not present

## 2015-08-30 DIAGNOSIS — I110 Hypertensive heart disease with heart failure: Secondary | ICD-10-CM | POA: Diagnosis present

## 2015-08-30 DIAGNOSIS — Z885 Allergy status to narcotic agent status: Secondary | ICD-10-CM | POA: Diagnosis not present

## 2015-08-30 DIAGNOSIS — Z7982 Long term (current) use of aspirin: Secondary | ICD-10-CM | POA: Diagnosis not present

## 2015-08-30 DIAGNOSIS — I10 Essential (primary) hypertension: Secondary | ICD-10-CM | POA: Diagnosis not present

## 2015-08-30 DIAGNOSIS — R079 Chest pain, unspecified: Secondary | ICD-10-CM | POA: Diagnosis not present

## 2015-08-30 DIAGNOSIS — R55 Syncope and collapse: Secondary | ICD-10-CM | POA: Diagnosis not present

## 2015-08-30 DIAGNOSIS — Z955 Presence of coronary angioplasty implant and graft: Secondary | ICD-10-CM | POA: Diagnosis not present

## 2015-08-30 DIAGNOSIS — E782 Mixed hyperlipidemia: Secondary | ICD-10-CM | POA: Diagnosis present

## 2015-08-30 DIAGNOSIS — I2510 Atherosclerotic heart disease of native coronary artery without angina pectoris: Secondary | ICD-10-CM | POA: Diagnosis not present

## 2015-08-30 DIAGNOSIS — K3184 Gastroparesis: Secondary | ICD-10-CM | POA: Diagnosis present

## 2015-08-30 DIAGNOSIS — E1136 Type 2 diabetes mellitus with diabetic cataract: Secondary | ICD-10-CM | POA: Diagnosis present

## 2015-08-30 DIAGNOSIS — Z79891 Long term (current) use of opiate analgesic: Secondary | ICD-10-CM | POA: Diagnosis not present

## 2015-08-30 DIAGNOSIS — E669 Obesity, unspecified: Secondary | ICD-10-CM | POA: Diagnosis present

## 2015-08-30 DIAGNOSIS — R42 Dizziness and giddiness: Secondary | ICD-10-CM | POA: Diagnosis not present

## 2015-08-30 DIAGNOSIS — J449 Chronic obstructive pulmonary disease, unspecified: Secondary | ICD-10-CM | POA: Diagnosis present

## 2015-08-30 DIAGNOSIS — E1143 Type 2 diabetes mellitus with diabetic autonomic (poly)neuropathy: Secondary | ICD-10-CM | POA: Diagnosis present

## 2015-08-30 DIAGNOSIS — Z794 Long term (current) use of insulin: Secondary | ICD-10-CM | POA: Diagnosis not present

## 2015-08-30 DIAGNOSIS — N179 Acute kidney failure, unspecified: Secondary | ICD-10-CM | POA: Insufficient documentation

## 2015-08-30 DIAGNOSIS — M1990 Unspecified osteoarthritis, unspecified site: Secondary | ICD-10-CM | POA: Diagnosis present

## 2015-08-30 DIAGNOSIS — Z780 Asymptomatic menopausal state: Secondary | ICD-10-CM | POA: Diagnosis not present

## 2015-08-30 DIAGNOSIS — I480 Paroxysmal atrial fibrillation: Secondary | ICD-10-CM | POA: Diagnosis not present

## 2015-08-30 DIAGNOSIS — R6510 Systemic inflammatory response syndrome (SIRS) of non-infectious origin without acute organ dysfunction: Secondary | ICD-10-CM | POA: Insufficient documentation

## 2015-08-30 DIAGNOSIS — Z91013 Allergy to seafood: Secondary | ICD-10-CM | POA: Diagnosis not present

## 2015-08-30 DIAGNOSIS — I951 Orthostatic hypotension: Secondary | ICD-10-CM | POA: Diagnosis present

## 2015-08-30 DIAGNOSIS — E1142 Type 2 diabetes mellitus with diabetic polyneuropathy: Secondary | ICD-10-CM | POA: Diagnosis not present

## 2015-08-30 DIAGNOSIS — B349 Viral infection, unspecified: Secondary | ICD-10-CM | POA: Diagnosis not present

## 2015-08-30 DIAGNOSIS — I509 Heart failure, unspecified: Secondary | ICD-10-CM | POA: Diagnosis present

## 2015-08-30 DIAGNOSIS — R0789 Other chest pain: Secondary | ICD-10-CM | POA: Diagnosis not present

## 2015-08-30 DIAGNOSIS — F329 Major depressive disorder, single episode, unspecified: Secondary | ICD-10-CM | POA: Diagnosis present

## 2015-09-05 DIAGNOSIS — N189 Chronic kidney disease, unspecified: Secondary | ICD-10-CM | POA: Diagnosis not present

## 2015-09-05 DIAGNOSIS — I129 Hypertensive chronic kidney disease with stage 1 through stage 4 chronic kidney disease, or unspecified chronic kidney disease: Secondary | ICD-10-CM | POA: Diagnosis not present

## 2015-09-05 DIAGNOSIS — E11649 Type 2 diabetes mellitus with hypoglycemia without coma: Secondary | ICD-10-CM | POA: Diagnosis not present

## 2015-09-05 DIAGNOSIS — E1165 Type 2 diabetes mellitus with hyperglycemia: Secondary | ICD-10-CM | POA: Diagnosis not present

## 2015-09-05 DIAGNOSIS — E1142 Type 2 diabetes mellitus with diabetic polyneuropathy: Secondary | ICD-10-CM | POA: Diagnosis not present

## 2015-09-05 DIAGNOSIS — J449 Chronic obstructive pulmonary disease, unspecified: Secondary | ICD-10-CM | POA: Diagnosis not present

## 2015-09-05 DIAGNOSIS — I2510 Atherosclerotic heart disease of native coronary artery without angina pectoris: Secondary | ICD-10-CM | POA: Diagnosis not present

## 2015-09-05 DIAGNOSIS — I509 Heart failure, unspecified: Secondary | ICD-10-CM | POA: Diagnosis not present

## 2015-09-06 DIAGNOSIS — J449 Chronic obstructive pulmonary disease, unspecified: Secondary | ICD-10-CM | POA: Diagnosis not present

## 2015-09-06 DIAGNOSIS — I129 Hypertensive chronic kidney disease with stage 1 through stage 4 chronic kidney disease, or unspecified chronic kidney disease: Secondary | ICD-10-CM | POA: Diagnosis not present

## 2015-09-06 DIAGNOSIS — N189 Chronic kidney disease, unspecified: Secondary | ICD-10-CM | POA: Diagnosis not present

## 2015-09-06 DIAGNOSIS — I2510 Atherosclerotic heart disease of native coronary artery without angina pectoris: Secondary | ICD-10-CM | POA: Diagnosis not present

## 2015-09-06 DIAGNOSIS — E11649 Type 2 diabetes mellitus with hypoglycemia without coma: Secondary | ICD-10-CM | POA: Diagnosis not present

## 2015-09-06 DIAGNOSIS — I509 Heart failure, unspecified: Secondary | ICD-10-CM | POA: Diagnosis not present

## 2015-09-07 DIAGNOSIS — I509 Heart failure, unspecified: Secondary | ICD-10-CM | POA: Diagnosis not present

## 2015-09-07 DIAGNOSIS — I2510 Atherosclerotic heart disease of native coronary artery without angina pectoris: Secondary | ICD-10-CM | POA: Diagnosis not present

## 2015-09-07 DIAGNOSIS — E11649 Type 2 diabetes mellitus with hypoglycemia without coma: Secondary | ICD-10-CM | POA: Diagnosis not present

## 2015-09-07 DIAGNOSIS — I129 Hypertensive chronic kidney disease with stage 1 through stage 4 chronic kidney disease, or unspecified chronic kidney disease: Secondary | ICD-10-CM | POA: Diagnosis not present

## 2015-09-07 DIAGNOSIS — J449 Chronic obstructive pulmonary disease, unspecified: Secondary | ICD-10-CM | POA: Diagnosis not present

## 2015-09-07 DIAGNOSIS — N189 Chronic kidney disease, unspecified: Secondary | ICD-10-CM | POA: Diagnosis not present

## 2015-09-08 DIAGNOSIS — E1142 Type 2 diabetes mellitus with diabetic polyneuropathy: Secondary | ICD-10-CM | POA: Diagnosis not present

## 2015-09-08 DIAGNOSIS — I10 Essential (primary) hypertension: Secondary | ICD-10-CM | POA: Diagnosis not present

## 2015-09-08 DIAGNOSIS — I480 Paroxysmal atrial fibrillation: Secondary | ICD-10-CM | POA: Diagnosis not present

## 2015-09-08 DIAGNOSIS — I2510 Atherosclerotic heart disease of native coronary artery without angina pectoris: Secondary | ICD-10-CM | POA: Diagnosis not present

## 2015-09-08 DIAGNOSIS — R42 Dizziness and giddiness: Secondary | ICD-10-CM | POA: Diagnosis not present

## 2015-09-08 DIAGNOSIS — J449 Chronic obstructive pulmonary disease, unspecified: Secondary | ICD-10-CM | POA: Diagnosis not present

## 2015-09-08 DIAGNOSIS — I5032 Chronic diastolic (congestive) heart failure: Secondary | ICD-10-CM | POA: Diagnosis not present

## 2015-09-13 DIAGNOSIS — J449 Chronic obstructive pulmonary disease, unspecified: Secondary | ICD-10-CM | POA: Diagnosis not present

## 2015-09-13 DIAGNOSIS — E11649 Type 2 diabetes mellitus with hypoglycemia without coma: Secondary | ICD-10-CM | POA: Diagnosis not present

## 2015-09-13 DIAGNOSIS — N189 Chronic kidney disease, unspecified: Secondary | ICD-10-CM | POA: Diagnosis not present

## 2015-09-13 DIAGNOSIS — I129 Hypertensive chronic kidney disease with stage 1 through stage 4 chronic kidney disease, or unspecified chronic kidney disease: Secondary | ICD-10-CM | POA: Diagnosis not present

## 2015-09-13 DIAGNOSIS — I2510 Atherosclerotic heart disease of native coronary artery without angina pectoris: Secondary | ICD-10-CM | POA: Diagnosis not present

## 2015-09-13 DIAGNOSIS — I509 Heart failure, unspecified: Secondary | ICD-10-CM | POA: Diagnosis not present

## 2015-09-15 DIAGNOSIS — I951 Orthostatic hypotension: Secondary | ICD-10-CM | POA: Diagnosis not present

## 2015-09-15 DIAGNOSIS — I480 Paroxysmal atrial fibrillation: Secondary | ICD-10-CM | POA: Diagnosis not present

## 2015-09-15 DIAGNOSIS — I10 Essential (primary) hypertension: Secondary | ICD-10-CM | POA: Diagnosis not present

## 2015-09-15 DIAGNOSIS — I5032 Chronic diastolic (congestive) heart failure: Secondary | ICD-10-CM | POA: Diagnosis not present

## 2015-09-15 DIAGNOSIS — I2510 Atherosclerotic heart disease of native coronary artery without angina pectoris: Secondary | ICD-10-CM | POA: Diagnosis not present

## 2015-09-20 DIAGNOSIS — J449 Chronic obstructive pulmonary disease, unspecified: Secondary | ICD-10-CM | POA: Diagnosis not present

## 2015-09-20 DIAGNOSIS — I2510 Atherosclerotic heart disease of native coronary artery without angina pectoris: Secondary | ICD-10-CM | POA: Diagnosis not present

## 2015-09-20 DIAGNOSIS — N189 Chronic kidney disease, unspecified: Secondary | ICD-10-CM | POA: Diagnosis not present

## 2015-09-20 DIAGNOSIS — I509 Heart failure, unspecified: Secondary | ICD-10-CM | POA: Diagnosis not present

## 2015-09-20 DIAGNOSIS — E11649 Type 2 diabetes mellitus with hypoglycemia without coma: Secondary | ICD-10-CM | POA: Diagnosis not present

## 2015-09-20 DIAGNOSIS — I129 Hypertensive chronic kidney disease with stage 1 through stage 4 chronic kidney disease, or unspecified chronic kidney disease: Secondary | ICD-10-CM | POA: Diagnosis not present

## 2015-09-22 DIAGNOSIS — E109 Type 1 diabetes mellitus without complications: Secondary | ICD-10-CM | POA: Diagnosis not present

## 2015-09-22 DIAGNOSIS — H40003 Preglaucoma, unspecified, bilateral: Secondary | ICD-10-CM | POA: Diagnosis not present

## 2015-09-23 DIAGNOSIS — E1142 Type 2 diabetes mellitus with diabetic polyneuropathy: Secondary | ICD-10-CM | POA: Diagnosis not present

## 2015-09-23 DIAGNOSIS — E1165 Type 2 diabetes mellitus with hyperglycemia: Secondary | ICD-10-CM | POA: Diagnosis not present

## 2015-09-28 DIAGNOSIS — E1165 Type 2 diabetes mellitus with hyperglycemia: Secondary | ICD-10-CM | POA: Diagnosis not present

## 2015-09-28 DIAGNOSIS — E1142 Type 2 diabetes mellitus with diabetic polyneuropathy: Secondary | ICD-10-CM | POA: Diagnosis not present

## 2015-09-29 DIAGNOSIS — E860 Dehydration: Secondary | ICD-10-CM | POA: Diagnosis not present

## 2015-09-29 DIAGNOSIS — K219 Gastro-esophageal reflux disease without esophagitis: Secondary | ICD-10-CM | POA: Diagnosis not present

## 2015-09-29 DIAGNOSIS — R079 Chest pain, unspecified: Secondary | ICD-10-CM | POA: Diagnosis not present

## 2015-09-29 DIAGNOSIS — J449 Chronic obstructive pulmonary disease, unspecified: Secondary | ICD-10-CM | POA: Diagnosis not present

## 2015-09-29 DIAGNOSIS — E785 Hyperlipidemia, unspecified: Secondary | ICD-10-CM | POA: Diagnosis not present

## 2015-09-29 DIAGNOSIS — I25118 Atherosclerotic heart disease of native coronary artery with other forms of angina pectoris: Secondary | ICD-10-CM | POA: Diagnosis not present

## 2015-09-29 DIAGNOSIS — I482 Chronic atrial fibrillation: Secondary | ICD-10-CM | POA: Diagnosis not present

## 2015-09-29 DIAGNOSIS — I10 Essential (primary) hypertension: Secondary | ICD-10-CM | POA: Diagnosis not present

## 2015-09-29 DIAGNOSIS — Z79899 Other long term (current) drug therapy: Secondary | ICD-10-CM | POA: Diagnosis not present

## 2015-09-29 DIAGNOSIS — J9811 Atelectasis: Secondary | ICD-10-CM | POA: Diagnosis not present

## 2015-09-29 DIAGNOSIS — R110 Nausea: Secondary | ICD-10-CM | POA: Diagnosis not present

## 2015-09-29 DIAGNOSIS — Z955 Presence of coronary angioplasty implant and graft: Secondary | ICD-10-CM | POA: Diagnosis not present

## 2015-09-29 DIAGNOSIS — I480 Paroxysmal atrial fibrillation: Secondary | ICD-10-CM | POA: Diagnosis not present

## 2015-09-29 DIAGNOSIS — Z794 Long term (current) use of insulin: Secondary | ICD-10-CM | POA: Diagnosis not present

## 2015-09-29 DIAGNOSIS — Z87891 Personal history of nicotine dependence: Secondary | ICD-10-CM | POA: Diagnosis not present

## 2015-09-29 DIAGNOSIS — E1142 Type 2 diabetes mellitus with diabetic polyneuropathy: Secondary | ICD-10-CM | POA: Diagnosis not present

## 2015-09-29 DIAGNOSIS — I200 Unstable angina: Secondary | ICD-10-CM | POA: Diagnosis not present

## 2015-09-29 DIAGNOSIS — I25110 Atherosclerotic heart disease of native coronary artery with unstable angina pectoris: Secondary | ICD-10-CM | POA: Diagnosis not present

## 2015-10-03 DIAGNOSIS — R0989 Other specified symptoms and signs involving the circulatory and respiratory systems: Secondary | ICD-10-CM | POA: Diagnosis not present

## 2015-10-03 DIAGNOSIS — I517 Cardiomegaly: Secondary | ICD-10-CM | POA: Diagnosis not present

## 2015-10-03 DIAGNOSIS — I480 Paroxysmal atrial fibrillation: Secondary | ICD-10-CM | POA: Diagnosis not present

## 2015-10-03 DIAGNOSIS — I4891 Unspecified atrial fibrillation: Secondary | ICD-10-CM | POA: Diagnosis not present

## 2015-10-03 DIAGNOSIS — I2510 Atherosclerotic heart disease of native coronary artery without angina pectoris: Secondary | ICD-10-CM | POA: Diagnosis not present

## 2015-10-04 DIAGNOSIS — I480 Paroxysmal atrial fibrillation: Secondary | ICD-10-CM | POA: Diagnosis not present

## 2015-10-04 DIAGNOSIS — Z89422 Acquired absence of other left toe(s): Secondary | ICD-10-CM | POA: Diagnosis not present

## 2015-10-04 DIAGNOSIS — E1142 Type 2 diabetes mellitus with diabetic polyneuropathy: Secondary | ICD-10-CM | POA: Diagnosis not present

## 2015-10-04 DIAGNOSIS — Z8679 Personal history of other diseases of the circulatory system: Secondary | ICD-10-CM | POA: Insufficient documentation

## 2015-10-04 DIAGNOSIS — E785 Hyperlipidemia, unspecified: Secondary | ICD-10-CM | POA: Diagnosis not present

## 2015-10-04 DIAGNOSIS — Z87891 Personal history of nicotine dependence: Secondary | ICD-10-CM | POA: Diagnosis not present

## 2015-10-04 DIAGNOSIS — J449 Chronic obstructive pulmonary disease, unspecified: Secondary | ICD-10-CM | POA: Diagnosis not present

## 2015-10-04 DIAGNOSIS — Z9889 Other specified postprocedural states: Secondary | ICD-10-CM

## 2015-10-04 DIAGNOSIS — Z79899 Other long term (current) drug therapy: Secondary | ICD-10-CM | POA: Diagnosis not present

## 2015-10-04 DIAGNOSIS — Z794 Long term (current) use of insulin: Secondary | ICD-10-CM | POA: Diagnosis not present

## 2015-10-04 DIAGNOSIS — I10 Essential (primary) hypertension: Secondary | ICD-10-CM | POA: Diagnosis not present

## 2015-10-04 DIAGNOSIS — I5032 Chronic diastolic (congestive) heart failure: Secondary | ICD-10-CM | POA: Diagnosis not present

## 2015-10-04 DIAGNOSIS — R42 Dizziness and giddiness: Secondary | ICD-10-CM | POA: Diagnosis not present

## 2015-10-04 DIAGNOSIS — Z7982 Long term (current) use of aspirin: Secondary | ICD-10-CM | POA: Diagnosis not present

## 2015-10-04 DIAGNOSIS — F329 Major depressive disorder, single episode, unspecified: Secondary | ICD-10-CM | POA: Diagnosis not present

## 2015-10-04 DIAGNOSIS — I2510 Atherosclerotic heart disease of native coronary artery without angina pectoris: Secondary | ICD-10-CM | POA: Diagnosis not present

## 2015-10-05 DIAGNOSIS — I10 Essential (primary) hypertension: Secondary | ICD-10-CM | POA: Diagnosis not present

## 2015-10-05 DIAGNOSIS — I4891 Unspecified atrial fibrillation: Secondary | ICD-10-CM | POA: Insufficient documentation

## 2015-10-05 DIAGNOSIS — R42 Dizziness and giddiness: Secondary | ICD-10-CM | POA: Diagnosis not present

## 2015-10-05 DIAGNOSIS — J449 Chronic obstructive pulmonary disease, unspecified: Secondary | ICD-10-CM | POA: Diagnosis not present

## 2015-10-05 DIAGNOSIS — E119 Type 2 diabetes mellitus without complications: Secondary | ICD-10-CM | POA: Diagnosis not present

## 2015-10-05 DIAGNOSIS — I480 Paroxysmal atrial fibrillation: Secondary | ICD-10-CM | POA: Diagnosis not present

## 2015-10-05 DIAGNOSIS — I5032 Chronic diastolic (congestive) heart failure: Secondary | ICD-10-CM | POA: Diagnosis not present

## 2015-10-05 DIAGNOSIS — I4819 Other persistent atrial fibrillation: Secondary | ICD-10-CM | POA: Insufficient documentation

## 2015-10-05 DIAGNOSIS — I2510 Atherosclerotic heart disease of native coronary artery without angina pectoris: Secondary | ICD-10-CM | POA: Diagnosis not present

## 2015-10-12 DIAGNOSIS — IMO0002 Reserved for concepts with insufficient information to code with codable children: Secondary | ICD-10-CM | POA: Insufficient documentation

## 2015-10-12 DIAGNOSIS — Z794 Long term (current) use of insulin: Secondary | ICD-10-CM | POA: Insufficient documentation

## 2015-10-12 DIAGNOSIS — E1042 Type 1 diabetes mellitus with diabetic polyneuropathy: Secondary | ICD-10-CM | POA: Insufficient documentation

## 2015-10-12 DIAGNOSIS — E1065 Type 1 diabetes mellitus with hyperglycemia: Secondary | ICD-10-CM | POA: Insufficient documentation

## 2015-10-27 DIAGNOSIS — Z8679 Personal history of other diseases of the circulatory system: Secondary | ICD-10-CM | POA: Diagnosis not present

## 2015-10-27 DIAGNOSIS — I10 Essential (primary) hypertension: Secondary | ICD-10-CM | POA: Diagnosis not present

## 2015-10-27 DIAGNOSIS — E1142 Type 2 diabetes mellitus with diabetic polyneuropathy: Secondary | ICD-10-CM | POA: Diagnosis not present

## 2015-10-27 DIAGNOSIS — R42 Dizziness and giddiness: Secondary | ICD-10-CM | POA: Diagnosis not present

## 2015-10-27 DIAGNOSIS — I5032 Chronic diastolic (congestive) heart failure: Secondary | ICD-10-CM | POA: Diagnosis not present

## 2015-10-27 DIAGNOSIS — I480 Paroxysmal atrial fibrillation: Secondary | ICD-10-CM | POA: Diagnosis not present

## 2015-10-27 DIAGNOSIS — L723 Sebaceous cyst: Secondary | ICD-10-CM | POA: Diagnosis not present

## 2015-10-27 DIAGNOSIS — I2510 Atherosclerotic heart disease of native coronary artery without angina pectoris: Secondary | ICD-10-CM | POA: Diagnosis not present

## 2015-10-27 DIAGNOSIS — E782 Mixed hyperlipidemia: Secondary | ICD-10-CM | POA: Diagnosis not present

## 2015-11-10 DIAGNOSIS — E782 Mixed hyperlipidemia: Secondary | ICD-10-CM | POA: Diagnosis not present

## 2015-11-10 DIAGNOSIS — E1142 Type 2 diabetes mellitus with diabetic polyneuropathy: Secondary | ICD-10-CM | POA: Diagnosis not present

## 2015-11-11 DIAGNOSIS — H40003 Preglaucoma, unspecified, bilateral: Secondary | ICD-10-CM | POA: Diagnosis not present

## 2015-11-21 DIAGNOSIS — I119 Hypertensive heart disease without heart failure: Secondary | ICD-10-CM | POA: Diagnosis not present

## 2015-11-21 DIAGNOSIS — E038 Other specified hypothyroidism: Secondary | ICD-10-CM | POA: Diagnosis not present

## 2015-11-21 DIAGNOSIS — E782 Mixed hyperlipidemia: Secondary | ICD-10-CM | POA: Diagnosis not present

## 2015-11-21 DIAGNOSIS — R600 Localized edema: Secondary | ICD-10-CM | POA: Diagnosis not present

## 2015-11-21 DIAGNOSIS — M545 Low back pain: Secondary | ICD-10-CM | POA: Diagnosis not present

## 2015-11-21 DIAGNOSIS — I5032 Chronic diastolic (congestive) heart failure: Secondary | ICD-10-CM | POA: Diagnosis not present

## 2015-11-21 DIAGNOSIS — R0902 Hypoxemia: Secondary | ICD-10-CM | POA: Diagnosis not present

## 2015-11-21 DIAGNOSIS — I482 Chronic atrial fibrillation: Secondary | ICD-10-CM | POA: Diagnosis not present

## 2015-11-21 DIAGNOSIS — F5102 Adjustment insomnia: Secondary | ICD-10-CM | POA: Diagnosis not present

## 2015-11-21 DIAGNOSIS — E1142 Type 2 diabetes mellitus with diabetic polyneuropathy: Secondary | ICD-10-CM | POA: Diagnosis not present

## 2015-11-21 DIAGNOSIS — I2510 Atherosclerotic heart disease of native coronary artery without angina pectoris: Secondary | ICD-10-CM | POA: Diagnosis not present

## 2015-12-29 DIAGNOSIS — J208 Acute bronchitis due to other specified organisms: Secondary | ICD-10-CM | POA: Diagnosis not present

## 2016-01-05 DIAGNOSIS — J410 Simple chronic bronchitis: Secondary | ICD-10-CM | POA: Diagnosis not present

## 2016-01-05 DIAGNOSIS — R0902 Hypoxemia: Secondary | ICD-10-CM | POA: Diagnosis not present

## 2016-01-15 DIAGNOSIS — R0602 Shortness of breath: Secondary | ICD-10-CM | POA: Diagnosis not present

## 2016-01-15 DIAGNOSIS — R05 Cough: Secondary | ICD-10-CM | POA: Diagnosis not present

## 2016-02-09 DIAGNOSIS — E782 Mixed hyperlipidemia: Secondary | ICD-10-CM | POA: Diagnosis not present

## 2016-02-09 DIAGNOSIS — I2510 Atherosclerotic heart disease of native coronary artery without angina pectoris: Secondary | ICD-10-CM | POA: Diagnosis not present

## 2016-02-09 DIAGNOSIS — I480 Paroxysmal atrial fibrillation: Secondary | ICD-10-CM | POA: Diagnosis not present

## 2016-02-09 DIAGNOSIS — I10 Essential (primary) hypertension: Secondary | ICD-10-CM | POA: Diagnosis not present

## 2016-02-09 DIAGNOSIS — I5032 Chronic diastolic (congestive) heart failure: Secondary | ICD-10-CM | POA: Diagnosis not present

## 2016-02-13 DIAGNOSIS — K2900 Acute gastritis without bleeding: Secondary | ICD-10-CM | POA: Diagnosis not present

## 2016-02-13 DIAGNOSIS — R1013 Epigastric pain: Secondary | ICD-10-CM | POA: Diagnosis not present

## 2016-02-13 DIAGNOSIS — K921 Melena: Secondary | ICD-10-CM | POA: Diagnosis not present

## 2016-02-14 DIAGNOSIS — M170 Bilateral primary osteoarthritis of knee: Secondary | ICD-10-CM | POA: Diagnosis not present

## 2016-02-23 DIAGNOSIS — Z6833 Body mass index (BMI) 33.0-33.9, adult: Secondary | ICD-10-CM | POA: Diagnosis not present

## 2016-02-23 DIAGNOSIS — R112 Nausea with vomiting, unspecified: Secondary | ICD-10-CM | POA: Diagnosis not present

## 2016-02-23 DIAGNOSIS — K921 Melena: Secondary | ICD-10-CM | POA: Diagnosis not present

## 2016-02-23 DIAGNOSIS — Z7951 Long term (current) use of inhaled steroids: Secondary | ICD-10-CM | POA: Diagnosis not present

## 2016-02-23 DIAGNOSIS — K210 Gastro-esophageal reflux disease with esophagitis: Secondary | ICD-10-CM | POA: Diagnosis not present

## 2016-02-23 DIAGNOSIS — Z79899 Other long term (current) drug therapy: Secondary | ICD-10-CM | POA: Diagnosis not present

## 2016-02-23 DIAGNOSIS — Z8719 Personal history of other diseases of the digestive system: Secondary | ICD-10-CM | POA: Diagnosis not present

## 2016-02-23 DIAGNOSIS — Z86010 Personal history of colonic polyps: Secondary | ICD-10-CM | POA: Diagnosis not present

## 2016-02-23 DIAGNOSIS — K625 Hemorrhage of anus and rectum: Secondary | ICD-10-CM | POA: Diagnosis not present

## 2016-02-28 DIAGNOSIS — R600 Localized edema: Secondary | ICD-10-CM | POA: Diagnosis not present

## 2016-02-28 DIAGNOSIS — M5136 Other intervertebral disc degeneration, lumbar region: Secondary | ICD-10-CM | POA: Diagnosis not present

## 2016-02-28 DIAGNOSIS — E782 Mixed hyperlipidemia: Secondary | ICD-10-CM | POA: Diagnosis not present

## 2016-02-28 DIAGNOSIS — F5102 Adjustment insomnia: Secondary | ICD-10-CM | POA: Diagnosis not present

## 2016-02-28 DIAGNOSIS — E1142 Type 2 diabetes mellitus with diabetic polyneuropathy: Secondary | ICD-10-CM | POA: Diagnosis not present

## 2016-02-28 DIAGNOSIS — Z1389 Encounter for screening for other disorder: Secondary | ICD-10-CM | POA: Diagnosis not present

## 2016-02-28 DIAGNOSIS — I482 Chronic atrial fibrillation: Secondary | ICD-10-CM | POA: Diagnosis not present

## 2016-02-28 DIAGNOSIS — E1342 Other specified diabetes mellitus with diabetic polyneuropathy: Secondary | ICD-10-CM | POA: Diagnosis not present

## 2016-02-28 DIAGNOSIS — J449 Chronic obstructive pulmonary disease, unspecified: Secondary | ICD-10-CM | POA: Diagnosis not present

## 2016-02-28 DIAGNOSIS — M170 Bilateral primary osteoarthritis of knee: Secondary | ICD-10-CM | POA: Diagnosis not present

## 2016-02-28 DIAGNOSIS — I5032 Chronic diastolic (congestive) heart failure: Secondary | ICD-10-CM | POA: Diagnosis not present

## 2016-02-28 DIAGNOSIS — G894 Chronic pain syndrome: Secondary | ICD-10-CM | POA: Diagnosis not present

## 2016-02-28 DIAGNOSIS — R0902 Hypoxemia: Secondary | ICD-10-CM | POA: Diagnosis not present

## 2016-02-28 DIAGNOSIS — E038 Other specified hypothyroidism: Secondary | ICD-10-CM | POA: Diagnosis not present

## 2016-02-28 DIAGNOSIS — M545 Low back pain: Secondary | ICD-10-CM | POA: Diagnosis not present

## 2016-02-28 DIAGNOSIS — I119 Hypertensive heart disease without heart failure: Secondary | ICD-10-CM | POA: Diagnosis not present

## 2016-03-22 DIAGNOSIS — N644 Mastodynia: Secondary | ICD-10-CM | POA: Diagnosis not present

## 2016-03-23 DIAGNOSIS — I25118 Atherosclerotic heart disease of native coronary artery with other forms of angina pectoris: Secondary | ICD-10-CM | POA: Insufficient documentation

## 2016-03-23 DIAGNOSIS — Z9981 Dependence on supplemental oxygen: Secondary | ICD-10-CM | POA: Insufficient documentation

## 2016-03-23 DIAGNOSIS — I10 Essential (primary) hypertension: Secondary | ICD-10-CM | POA: Insufficient documentation

## 2016-03-23 DIAGNOSIS — Z01818 Encounter for other preprocedural examination: Secondary | ICD-10-CM | POA: Diagnosis not present

## 2016-03-23 DIAGNOSIS — G8929 Other chronic pain: Secondary | ICD-10-CM | POA: Insufficient documentation

## 2016-03-23 DIAGNOSIS — K921 Melena: Secondary | ICD-10-CM | POA: Diagnosis not present

## 2016-03-23 DIAGNOSIS — E118 Type 2 diabetes mellitus with unspecified complications: Secondary | ICD-10-CM | POA: Diagnosis not present

## 2016-03-23 DIAGNOSIS — G894 Chronic pain syndrome: Secondary | ICD-10-CM | POA: Insufficient documentation

## 2016-03-23 DIAGNOSIS — K219 Gastro-esophageal reflux disease without esophagitis: Secondary | ICD-10-CM | POA: Insufficient documentation

## 2016-03-23 DIAGNOSIS — J449 Chronic obstructive pulmonary disease, unspecified: Secondary | ICD-10-CM | POA: Insufficient documentation

## 2016-03-23 DIAGNOSIS — I2510 Atherosclerotic heart disease of native coronary artery without angina pectoris: Secondary | ICD-10-CM | POA: Insufficient documentation

## 2016-03-23 DIAGNOSIS — Z86010 Personal history of colonic polyps: Secondary | ICD-10-CM | POA: Diagnosis not present

## 2016-03-23 DIAGNOSIS — N189 Chronic kidney disease, unspecified: Secondary | ICD-10-CM | POA: Insufficient documentation

## 2016-03-23 DIAGNOSIS — D631 Anemia in chronic kidney disease: Secondary | ICD-10-CM | POA: Insufficient documentation

## 2016-03-23 DIAGNOSIS — D649 Anemia, unspecified: Secondary | ICD-10-CM | POA: Insufficient documentation

## 2016-03-23 DIAGNOSIS — K625 Hemorrhage of anus and rectum: Secondary | ICD-10-CM | POA: Diagnosis not present

## 2016-03-29 DIAGNOSIS — Z1211 Encounter for screening for malignant neoplasm of colon: Secondary | ICD-10-CM | POA: Diagnosis not present

## 2016-03-29 DIAGNOSIS — Z01818 Encounter for other preprocedural examination: Secondary | ICD-10-CM | POA: Diagnosis not present

## 2016-03-29 DIAGNOSIS — K259 Gastric ulcer, unspecified as acute or chronic, without hemorrhage or perforation: Secondary | ICD-10-CM | POA: Diagnosis not present

## 2016-03-29 DIAGNOSIS — K625 Hemorrhage of anus and rectum: Secondary | ICD-10-CM | POA: Diagnosis not present

## 2016-03-29 DIAGNOSIS — K921 Melena: Secondary | ICD-10-CM | POA: Diagnosis not present

## 2016-03-29 DIAGNOSIS — E118 Type 2 diabetes mellitus with unspecified complications: Secondary | ICD-10-CM | POA: Diagnosis not present

## 2016-03-29 DIAGNOSIS — Z86010 Personal history of colonic polyps: Secondary | ICD-10-CM | POA: Diagnosis not present

## 2016-04-03 DIAGNOSIS — I10 Essential (primary) hypertension: Secondary | ICD-10-CM | POA: Diagnosis not present

## 2016-04-03 DIAGNOSIS — E782 Mixed hyperlipidemia: Secondary | ICD-10-CM | POA: Diagnosis not present

## 2016-04-03 DIAGNOSIS — I5032 Chronic diastolic (congestive) heart failure: Secondary | ICD-10-CM | POA: Diagnosis not present

## 2016-04-03 DIAGNOSIS — I2510 Atherosclerotic heart disease of native coronary artery without angina pectoris: Secondary | ICD-10-CM | POA: Diagnosis not present

## 2016-04-03 LAB — HM COLONOSCOPY

## 2016-05-03 DIAGNOSIS — Z794 Long term (current) use of insulin: Secondary | ICD-10-CM | POA: Diagnosis not present

## 2016-05-03 DIAGNOSIS — E1042 Type 1 diabetes mellitus with diabetic polyneuropathy: Secondary | ICD-10-CM | POA: Diagnosis not present

## 2016-05-03 DIAGNOSIS — E1165 Type 2 diabetes mellitus with hyperglycemia: Secondary | ICD-10-CM | POA: Diagnosis not present

## 2016-05-03 DIAGNOSIS — E1065 Type 1 diabetes mellitus with hyperglycemia: Secondary | ICD-10-CM | POA: Diagnosis not present

## 2016-05-03 DIAGNOSIS — E118 Type 2 diabetes mellitus with unspecified complications: Secondary | ICD-10-CM | POA: Diagnosis not present

## 2016-05-14 DIAGNOSIS — E113593 Type 2 diabetes mellitus with proliferative diabetic retinopathy without macular edema, bilateral: Secondary | ICD-10-CM | POA: Diagnosis not present

## 2016-05-14 DIAGNOSIS — H40003 Preglaucoma, unspecified, bilateral: Secondary | ICD-10-CM | POA: Diagnosis not present

## 2016-06-01 DIAGNOSIS — M545 Low back pain: Secondary | ICD-10-CM | POA: Diagnosis not present

## 2016-06-01 DIAGNOSIS — E038 Other specified hypothyroidism: Secondary | ICD-10-CM | POA: Diagnosis not present

## 2016-06-01 DIAGNOSIS — Z23 Encounter for immunization: Secondary | ICD-10-CM | POA: Diagnosis not present

## 2016-06-01 DIAGNOSIS — E782 Mixed hyperlipidemia: Secondary | ICD-10-CM | POA: Diagnosis not present

## 2016-06-01 DIAGNOSIS — R0902 Hypoxemia: Secondary | ICD-10-CM | POA: Diagnosis not present

## 2016-06-01 DIAGNOSIS — I119 Hypertensive heart disease without heart failure: Secondary | ICD-10-CM | POA: Diagnosis not present

## 2016-06-01 DIAGNOSIS — F5102 Adjustment insomnia: Secondary | ICD-10-CM | POA: Diagnosis not present

## 2016-06-01 DIAGNOSIS — R600 Localized edema: Secondary | ICD-10-CM | POA: Diagnosis not present

## 2016-06-01 DIAGNOSIS — M170 Bilateral primary osteoarthritis of knee: Secondary | ICD-10-CM | POA: Diagnosis not present

## 2016-06-01 DIAGNOSIS — E1142 Type 2 diabetes mellitus with diabetic polyneuropathy: Secondary | ICD-10-CM | POA: Diagnosis not present

## 2016-06-04 DIAGNOSIS — H401131 Primary open-angle glaucoma, bilateral, mild stage: Secondary | ICD-10-CM | POA: Diagnosis not present

## 2016-07-03 DIAGNOSIS — I480 Paroxysmal atrial fibrillation: Secondary | ICD-10-CM | POA: Diagnosis not present

## 2016-07-03 DIAGNOSIS — I10 Essential (primary) hypertension: Secondary | ICD-10-CM | POA: Diagnosis not present

## 2016-07-03 DIAGNOSIS — I2510 Atherosclerotic heart disease of native coronary artery without angina pectoris: Secondary | ICD-10-CM | POA: Diagnosis not present

## 2016-07-03 DIAGNOSIS — I5032 Chronic diastolic (congestive) heart failure: Secondary | ICD-10-CM | POA: Diagnosis not present

## 2016-07-12 DIAGNOSIS — I5032 Chronic diastolic (congestive) heart failure: Secondary | ICD-10-CM | POA: Diagnosis not present

## 2016-07-24 DIAGNOSIS — R0602 Shortness of breath: Secondary | ICD-10-CM | POA: Diagnosis not present

## 2016-07-24 DIAGNOSIS — J189 Pneumonia, unspecified organism: Secondary | ICD-10-CM | POA: Diagnosis not present

## 2016-07-24 DIAGNOSIS — J449 Chronic obstructive pulmonary disease, unspecified: Secondary | ICD-10-CM | POA: Diagnosis not present

## 2016-07-24 DIAGNOSIS — R0789 Other chest pain: Secondary | ICD-10-CM | POA: Diagnosis not present

## 2016-08-07 DIAGNOSIS — J158 Pneumonia due to other specified bacteria: Secondary | ICD-10-CM | POA: Diagnosis not present

## 2016-08-17 DIAGNOSIS — E1340 Other specified diabetes mellitus with diabetic neuropathy, unspecified: Secondary | ICD-10-CM | POA: Diagnosis not present

## 2016-08-17 DIAGNOSIS — M545 Low back pain: Secondary | ICD-10-CM | POA: Diagnosis not present

## 2016-08-17 DIAGNOSIS — M47816 Spondylosis without myelopathy or radiculopathy, lumbar region: Secondary | ICD-10-CM | POA: Diagnosis not present

## 2016-08-17 DIAGNOSIS — M461 Sacroiliitis, not elsewhere classified: Secondary | ICD-10-CM | POA: Diagnosis not present

## 2016-08-23 DIAGNOSIS — I5032 Chronic diastolic (congestive) heart failure: Secondary | ICD-10-CM | POA: Diagnosis not present

## 2016-08-27 DIAGNOSIS — Z89422 Acquired absence of other left toe(s): Secondary | ICD-10-CM | POA: Diagnosis not present

## 2016-08-27 DIAGNOSIS — I272 Pulmonary hypertension, unspecified: Secondary | ICD-10-CM | POA: Diagnosis present

## 2016-08-27 DIAGNOSIS — Z8679 Personal history of other diseases of the circulatory system: Secondary | ICD-10-CM | POA: Diagnosis not present

## 2016-08-27 DIAGNOSIS — E782 Mixed hyperlipidemia: Secondary | ICD-10-CM | POA: Diagnosis not present

## 2016-08-27 DIAGNOSIS — E875 Hyperkalemia: Secondary | ICD-10-CM | POA: Diagnosis not present

## 2016-08-27 DIAGNOSIS — Z87891 Personal history of nicotine dependence: Secondary | ICD-10-CM | POA: Diagnosis not present

## 2016-08-27 DIAGNOSIS — Z8249 Family history of ischemic heart disease and other diseases of the circulatory system: Secondary | ICD-10-CM | POA: Diagnosis not present

## 2016-08-27 DIAGNOSIS — I480 Paroxysmal atrial fibrillation: Secondary | ICD-10-CM | POA: Diagnosis not present

## 2016-08-27 DIAGNOSIS — Z794 Long term (current) use of insulin: Secondary | ICD-10-CM | POA: Diagnosis not present

## 2016-08-27 DIAGNOSIS — R9431 Abnormal electrocardiogram [ECG] [EKG]: Secondary | ICD-10-CM | POA: Diagnosis not present

## 2016-08-27 DIAGNOSIS — I110 Hypertensive heart disease with heart failure: Secondary | ICD-10-CM | POA: Diagnosis not present

## 2016-08-27 DIAGNOSIS — E669 Obesity, unspecified: Secondary | ICD-10-CM | POA: Diagnosis present

## 2016-08-27 DIAGNOSIS — N179 Acute kidney failure, unspecified: Secondary | ICD-10-CM | POA: Diagnosis not present

## 2016-08-27 DIAGNOSIS — E1110 Type 2 diabetes mellitus with ketoacidosis without coma: Secondary | ICD-10-CM | POA: Diagnosis not present

## 2016-08-27 DIAGNOSIS — E1142 Type 2 diabetes mellitus with diabetic polyneuropathy: Secondary | ICD-10-CM | POA: Diagnosis not present

## 2016-08-27 DIAGNOSIS — E785 Hyperlipidemia, unspecified: Secondary | ICD-10-CM | POA: Diagnosis not present

## 2016-08-27 DIAGNOSIS — Z955 Presence of coronary angioplasty implant and graft: Secondary | ICD-10-CM | POA: Diagnosis not present

## 2016-08-27 DIAGNOSIS — R0789 Other chest pain: Secondary | ICD-10-CM | POA: Diagnosis not present

## 2016-08-27 DIAGNOSIS — F329 Major depressive disorder, single episode, unspecified: Secondary | ICD-10-CM | POA: Diagnosis present

## 2016-08-27 DIAGNOSIS — E1310 Other specified diabetes mellitus with ketoacidosis without coma: Secondary | ICD-10-CM | POA: Diagnosis not present

## 2016-08-27 DIAGNOSIS — E1140 Type 2 diabetes mellitus with diabetic neuropathy, unspecified: Secondary | ICD-10-CM | POA: Diagnosis not present

## 2016-08-27 DIAGNOSIS — I25110 Atherosclerotic heart disease of native coronary artery with unstable angina pectoris: Secondary | ICD-10-CM | POA: Diagnosis not present

## 2016-08-27 DIAGNOSIS — I2510 Atherosclerotic heart disease of native coronary artery without angina pectoris: Secondary | ICD-10-CM | POA: Diagnosis not present

## 2016-08-27 DIAGNOSIS — Z951 Presence of aortocoronary bypass graft: Secondary | ICD-10-CM | POA: Diagnosis not present

## 2016-08-27 DIAGNOSIS — I5033 Acute on chronic diastolic (congestive) heart failure: Secondary | ICD-10-CM | POA: Diagnosis not present

## 2016-08-27 DIAGNOSIS — Z6839 Body mass index (BMI) 39.0-39.9, adult: Secondary | ICD-10-CM | POA: Diagnosis not present

## 2016-08-27 DIAGNOSIS — E1151 Type 2 diabetes mellitus with diabetic peripheral angiopathy without gangrene: Secondary | ICD-10-CM | POA: Diagnosis present

## 2016-08-27 DIAGNOSIS — R0602 Shortness of breath: Secondary | ICD-10-CM | POA: Diagnosis not present

## 2016-08-27 DIAGNOSIS — G4733 Obstructive sleep apnea (adult) (pediatric): Secondary | ICD-10-CM | POA: Diagnosis not present

## 2016-08-27 DIAGNOSIS — J449 Chronic obstructive pulmonary disease, unspecified: Secondary | ICD-10-CM | POA: Diagnosis present

## 2016-08-27 DIAGNOSIS — E119 Type 2 diabetes mellitus without complications: Secondary | ICD-10-CM | POA: Diagnosis not present

## 2016-08-27 DIAGNOSIS — I509 Heart failure, unspecified: Secondary | ICD-10-CM | POA: Diagnosis not present

## 2016-08-27 DIAGNOSIS — I10 Essential (primary) hypertension: Secondary | ICD-10-CM | POA: Diagnosis not present

## 2016-08-27 DIAGNOSIS — R079 Chest pain, unspecified: Secondary | ICD-10-CM | POA: Diagnosis not present

## 2016-09-04 DIAGNOSIS — K2970 Gastritis, unspecified, without bleeding: Secondary | ICD-10-CM | POA: Diagnosis not present

## 2016-09-04 DIAGNOSIS — I2510 Atherosclerotic heart disease of native coronary artery without angina pectoris: Secondary | ICD-10-CM | POA: Diagnosis not present

## 2016-09-04 DIAGNOSIS — M545 Low back pain: Secondary | ICD-10-CM | POA: Diagnosis not present

## 2016-09-04 DIAGNOSIS — I482 Chronic atrial fibrillation: Secondary | ICD-10-CM | POA: Diagnosis not present

## 2016-09-04 DIAGNOSIS — I5032 Chronic diastolic (congestive) heart failure: Secondary | ICD-10-CM | POA: Diagnosis not present

## 2016-09-04 DIAGNOSIS — E038 Other specified hypothyroidism: Secondary | ICD-10-CM | POA: Diagnosis not present

## 2016-09-04 DIAGNOSIS — I119 Hypertensive heart disease without heart failure: Secondary | ICD-10-CM | POA: Diagnosis not present

## 2016-09-04 DIAGNOSIS — E782 Mixed hyperlipidemia: Secondary | ICD-10-CM | POA: Diagnosis not present

## 2016-09-04 DIAGNOSIS — E1142 Type 2 diabetes mellitus with diabetic polyneuropathy: Secondary | ICD-10-CM | POA: Diagnosis not present

## 2016-09-04 DIAGNOSIS — I10 Essential (primary) hypertension: Secondary | ICD-10-CM | POA: Diagnosis not present

## 2016-09-10 DIAGNOSIS — I2510 Atherosclerotic heart disease of native coronary artery without angina pectoris: Secondary | ICD-10-CM | POA: Diagnosis not present

## 2016-09-10 DIAGNOSIS — I10 Essential (primary) hypertension: Secondary | ICD-10-CM | POA: Diagnosis not present

## 2016-09-10 DIAGNOSIS — E782 Mixed hyperlipidemia: Secondary | ICD-10-CM | POA: Diagnosis not present

## 2016-09-10 DIAGNOSIS — I5032 Chronic diastolic (congestive) heart failure: Secondary | ICD-10-CM | POA: Diagnosis not present

## 2016-09-17 DIAGNOSIS — I5032 Chronic diastolic (congestive) heart failure: Secondary | ICD-10-CM | POA: Diagnosis not present

## 2016-09-18 DIAGNOSIS — I5032 Chronic diastolic (congestive) heart failure: Secondary | ICD-10-CM | POA: Diagnosis not present

## 2016-10-01 DIAGNOSIS — H401131 Primary open-angle glaucoma, bilateral, mild stage: Secondary | ICD-10-CM | POA: Diagnosis not present

## 2016-10-09 DIAGNOSIS — I5032 Chronic diastolic (congestive) heart failure: Secondary | ICD-10-CM | POA: Diagnosis not present

## 2016-10-15 DIAGNOSIS — I110 Hypertensive heart disease with heart failure: Secondary | ICD-10-CM | POA: Diagnosis not present

## 2016-10-15 DIAGNOSIS — R0602 Shortness of breath: Secondary | ICD-10-CM | POA: Diagnosis not present

## 2016-10-15 DIAGNOSIS — I509 Heart failure, unspecified: Secondary | ICD-10-CM | POA: Diagnosis not present

## 2016-10-30 DIAGNOSIS — Z7902 Long term (current) use of antithrombotics/antiplatelets: Secondary | ICD-10-CM | POA: Diagnosis not present

## 2016-10-30 DIAGNOSIS — J9690 Respiratory failure, unspecified, unspecified whether with hypoxia or hypercapnia: Secondary | ICD-10-CM | POA: Diagnosis not present

## 2016-10-30 DIAGNOSIS — Z955 Presence of coronary angioplasty implant and graft: Secondary | ICD-10-CM | POA: Diagnosis not present

## 2016-10-30 DIAGNOSIS — I25110 Atherosclerotic heart disease of native coronary artery with unstable angina pectoris: Secondary | ICD-10-CM | POA: Diagnosis present

## 2016-10-30 DIAGNOSIS — R0600 Dyspnea, unspecified: Secondary | ICD-10-CM | POA: Diagnosis not present

## 2016-10-30 DIAGNOSIS — T501X5A Adverse effect of loop [high-ceiling] diuretics, initial encounter: Secondary | ICD-10-CM | POA: Diagnosis not present

## 2016-10-30 DIAGNOSIS — I10 Essential (primary) hypertension: Secondary | ICD-10-CM | POA: Diagnosis not present

## 2016-10-30 DIAGNOSIS — I2510 Atherosclerotic heart disease of native coronary artery without angina pectoris: Secondary | ICD-10-CM | POA: Diagnosis not present

## 2016-10-30 DIAGNOSIS — G8929 Other chronic pain: Secondary | ICD-10-CM | POA: Diagnosis not present

## 2016-10-30 DIAGNOSIS — Z8249 Family history of ischemic heart disease and other diseases of the circulatory system: Secondary | ICD-10-CM | POA: Diagnosis not present

## 2016-10-30 DIAGNOSIS — I509 Heart failure, unspecified: Secondary | ICD-10-CM | POA: Diagnosis not present

## 2016-10-30 DIAGNOSIS — Z87891 Personal history of nicotine dependence: Secondary | ICD-10-CM | POA: Diagnosis not present

## 2016-10-30 DIAGNOSIS — I5032 Chronic diastolic (congestive) heart failure: Secondary | ICD-10-CM | POA: Diagnosis not present

## 2016-10-30 DIAGNOSIS — R0902 Hypoxemia: Secondary | ICD-10-CM | POA: Diagnosis not present

## 2016-10-30 DIAGNOSIS — I272 Pulmonary hypertension, unspecified: Secondary | ICD-10-CM | POA: Diagnosis present

## 2016-10-30 DIAGNOSIS — I110 Hypertensive heart disease with heart failure: Secondary | ICD-10-CM | POA: Diagnosis present

## 2016-10-30 DIAGNOSIS — E860 Dehydration: Secondary | ICD-10-CM | POA: Diagnosis present

## 2016-10-30 DIAGNOSIS — Z9889 Other specified postprocedural states: Secondary | ICD-10-CM | POA: Diagnosis not present

## 2016-10-30 DIAGNOSIS — J449 Chronic obstructive pulmonary disease, unspecified: Secondary | ICD-10-CM | POA: Diagnosis not present

## 2016-10-30 DIAGNOSIS — Z8679 Personal history of other diseases of the circulatory system: Secondary | ICD-10-CM | POA: Diagnosis not present

## 2016-10-30 DIAGNOSIS — I5031 Acute diastolic (congestive) heart failure: Secondary | ICD-10-CM | POA: Diagnosis not present

## 2016-10-30 DIAGNOSIS — I480 Paroxysmal atrial fibrillation: Secondary | ICD-10-CM | POA: Diagnosis not present

## 2016-10-30 DIAGNOSIS — I959 Hypotension, unspecified: Secondary | ICD-10-CM | POA: Diagnosis not present

## 2016-10-30 DIAGNOSIS — R0602 Shortness of breath: Secondary | ICD-10-CM | POA: Diagnosis not present

## 2016-10-30 DIAGNOSIS — E119 Type 2 diabetes mellitus without complications: Secondary | ICD-10-CM | POA: Diagnosis not present

## 2016-10-30 DIAGNOSIS — G894 Chronic pain syndrome: Secondary | ICD-10-CM | POA: Diagnosis present

## 2016-10-30 DIAGNOSIS — I517 Cardiomegaly: Secondary | ICD-10-CM | POA: Diagnosis not present

## 2016-10-30 DIAGNOSIS — K219 Gastro-esophageal reflux disease without esophagitis: Secondary | ICD-10-CM | POA: Diagnosis not present

## 2016-10-30 DIAGNOSIS — E785 Hyperlipidemia, unspecified: Secondary | ICD-10-CM | POA: Diagnosis not present

## 2016-10-30 DIAGNOSIS — I5033 Acute on chronic diastolic (congestive) heart failure: Secondary | ICD-10-CM | POA: Diagnosis present

## 2016-10-30 DIAGNOSIS — K3184 Gastroparesis: Secondary | ICD-10-CM | POA: Diagnosis not present

## 2016-10-30 DIAGNOSIS — N179 Acute kidney failure, unspecified: Secondary | ICD-10-CM | POA: Diagnosis not present

## 2016-10-31 DIAGNOSIS — N179 Acute kidney failure, unspecified: Secondary | ICD-10-CM

## 2016-10-31 DIAGNOSIS — J449 Chronic obstructive pulmonary disease, unspecified: Secondary | ICD-10-CM

## 2016-11-07 DIAGNOSIS — R0602 Shortness of breath: Secondary | ICD-10-CM | POA: Diagnosis not present

## 2016-11-07 DIAGNOSIS — M545 Low back pain: Secondary | ICD-10-CM | POA: Diagnosis not present

## 2016-11-08 DIAGNOSIS — I5032 Chronic diastolic (congestive) heart failure: Secondary | ICD-10-CM | POA: Diagnosis not present

## 2016-11-08 DIAGNOSIS — E782 Mixed hyperlipidemia: Secondary | ICD-10-CM | POA: Diagnosis not present

## 2016-11-08 DIAGNOSIS — Z9889 Other specified postprocedural states: Secondary | ICD-10-CM | POA: Diagnosis not present

## 2016-11-08 DIAGNOSIS — I480 Paroxysmal atrial fibrillation: Secondary | ICD-10-CM | POA: Diagnosis not present

## 2016-11-08 DIAGNOSIS — D649 Anemia, unspecified: Secondary | ICD-10-CM | POA: Diagnosis not present

## 2016-11-08 DIAGNOSIS — Z8679 Personal history of other diseases of the circulatory system: Secondary | ICD-10-CM | POA: Diagnosis not present

## 2016-11-08 DIAGNOSIS — I2510 Atherosclerotic heart disease of native coronary artery without angina pectoris: Secondary | ICD-10-CM | POA: Diagnosis not present

## 2016-11-09 DIAGNOSIS — I5032 Chronic diastolic (congestive) heart failure: Secondary | ICD-10-CM | POA: Diagnosis not present

## 2016-11-12 DIAGNOSIS — I509 Heart failure, unspecified: Secondary | ICD-10-CM | POA: Diagnosis not present

## 2016-11-12 DIAGNOSIS — D649 Anemia, unspecified: Secondary | ICD-10-CM | POA: Diagnosis not present

## 2016-11-12 DIAGNOSIS — I5032 Chronic diastolic (congestive) heart failure: Secondary | ICD-10-CM | POA: Diagnosis not present

## 2016-11-14 DIAGNOSIS — G4733 Obstructive sleep apnea (adult) (pediatric): Secondary | ICD-10-CM | POA: Diagnosis not present

## 2016-11-14 DIAGNOSIS — R5383 Other fatigue: Secondary | ICD-10-CM | POA: Diagnosis not present

## 2016-11-14 DIAGNOSIS — E559 Vitamin D deficiency, unspecified: Secondary | ICD-10-CM | POA: Diagnosis not present

## 2016-11-14 DIAGNOSIS — J4520 Mild intermittent asthma, uncomplicated: Secondary | ICD-10-CM | POA: Diagnosis not present

## 2016-11-25 DIAGNOSIS — G4733 Obstructive sleep apnea (adult) (pediatric): Secondary | ICD-10-CM | POA: Diagnosis not present

## 2016-12-04 DIAGNOSIS — J4520 Mild intermittent asthma, uncomplicated: Secondary | ICD-10-CM | POA: Diagnosis not present

## 2016-12-04 DIAGNOSIS — R5383 Other fatigue: Secondary | ICD-10-CM | POA: Diagnosis not present

## 2016-12-04 DIAGNOSIS — G4733 Obstructive sleep apnea (adult) (pediatric): Secondary | ICD-10-CM | POA: Diagnosis not present

## 2016-12-06 DIAGNOSIS — E782 Mixed hyperlipidemia: Secondary | ICD-10-CM | POA: Diagnosis not present

## 2016-12-06 DIAGNOSIS — I119 Hypertensive heart disease without heart failure: Secondary | ICD-10-CM | POA: Diagnosis not present

## 2016-12-06 DIAGNOSIS — E1142 Type 2 diabetes mellitus with diabetic polyneuropathy: Secondary | ICD-10-CM | POA: Diagnosis not present

## 2016-12-06 DIAGNOSIS — E038 Other specified hypothyroidism: Secondary | ICD-10-CM | POA: Diagnosis not present

## 2016-12-06 DIAGNOSIS — I10 Essential (primary) hypertension: Secondary | ICD-10-CM | POA: Diagnosis not present

## 2016-12-12 DIAGNOSIS — J4520 Mild intermittent asthma, uncomplicated: Secondary | ICD-10-CM | POA: Diagnosis not present

## 2016-12-12 DIAGNOSIS — N3001 Acute cystitis with hematuria: Secondary | ICD-10-CM | POA: Diagnosis not present

## 2016-12-12 DIAGNOSIS — N3090 Cystitis, unspecified without hematuria: Secondary | ICD-10-CM | POA: Diagnosis not present

## 2016-12-12 DIAGNOSIS — G4733 Obstructive sleep apnea (adult) (pediatric): Secondary | ICD-10-CM | POA: Diagnosis not present

## 2016-12-12 DIAGNOSIS — R5383 Other fatigue: Secondary | ICD-10-CM | POA: Diagnosis not present

## 2016-12-14 DIAGNOSIS — E785 Hyperlipidemia, unspecified: Secondary | ICD-10-CM | POA: Insufficient documentation

## 2016-12-14 DIAGNOSIS — E1042 Type 1 diabetes mellitus with diabetic polyneuropathy: Secondary | ICD-10-CM | POA: Diagnosis not present

## 2016-12-14 DIAGNOSIS — E1065 Type 1 diabetes mellitus with hyperglycemia: Secondary | ICD-10-CM | POA: Diagnosis not present

## 2016-12-14 DIAGNOSIS — E782 Mixed hyperlipidemia: Secondary | ICD-10-CM | POA: Diagnosis not present

## 2016-12-14 DIAGNOSIS — Z794 Long term (current) use of insulin: Secondary | ICD-10-CM | POA: Diagnosis not present

## 2016-12-14 DIAGNOSIS — I10 Essential (primary) hypertension: Secondary | ICD-10-CM | POA: Diagnosis not present

## 2016-12-18 DIAGNOSIS — E1121 Type 2 diabetes mellitus with diabetic nephropathy: Secondary | ICD-10-CM | POA: Diagnosis not present

## 2016-12-18 DIAGNOSIS — N182 Chronic kidney disease, stage 2 (mild): Secondary | ICD-10-CM | POA: Diagnosis not present

## 2016-12-18 DIAGNOSIS — N3000 Acute cystitis without hematuria: Secondary | ICD-10-CM | POA: Diagnosis not present

## 2016-12-18 DIAGNOSIS — G4733 Obstructive sleep apnea (adult) (pediatric): Secondary | ICD-10-CM | POA: Diagnosis not present

## 2016-12-25 ENCOUNTER — Other Ambulatory Visit (HOSPITAL_COMMUNITY): Payer: Self-pay | Admitting: *Deleted

## 2016-12-25 DIAGNOSIS — R42 Dizziness and giddiness: Secondary | ICD-10-CM | POA: Insufficient documentation

## 2016-12-25 DIAGNOSIS — I25118 Atherosclerotic heart disease of native coronary artery with other forms of angina pectoris: Secondary | ICD-10-CM | POA: Insufficient documentation

## 2016-12-25 DIAGNOSIS — I2510 Atherosclerotic heart disease of native coronary artery without angina pectoris: Secondary | ICD-10-CM | POA: Insufficient documentation

## 2016-12-26 ENCOUNTER — Encounter (HOSPITAL_COMMUNITY): Payer: Self-pay | Admitting: *Deleted

## 2016-12-26 ENCOUNTER — Ambulatory Visit (HOSPITAL_COMMUNITY)
Admission: RE | Admit: 2016-12-26 | Discharge: 2016-12-26 | Disposition: A | Discharge status: Home / Self Care | Payer: Medicare Other | Source: Ambulatory Visit | Attending: Internal Medicine | Admitting: Internal Medicine

## 2016-12-26 ENCOUNTER — Other Ambulatory Visit (HOSPITAL_COMMUNITY): Payer: Self-pay | Admitting: *Deleted

## 2016-12-26 VITALS — BP 160/78 | HR 96 | Ht 64.0 in | Wt 251.8 lb

## 2016-12-26 DIAGNOSIS — Z794 Long term (current) use of insulin: Secondary | ICD-10-CM | POA: Diagnosis not present

## 2016-12-26 DIAGNOSIS — E785 Hyperlipidemia, unspecified: Secondary | ICD-10-CM | POA: Diagnosis not present

## 2016-12-26 DIAGNOSIS — I110 Hypertensive heart disease with heart failure: Secondary | ICD-10-CM | POA: Diagnosis not present

## 2016-12-26 DIAGNOSIS — R0602 Shortness of breath: Secondary | ICD-10-CM | POA: Insufficient documentation

## 2016-12-26 DIAGNOSIS — D649 Anemia, unspecified: Secondary | ICD-10-CM | POA: Diagnosis not present

## 2016-12-26 DIAGNOSIS — G4733 Obstructive sleep apnea (adult) (pediatric): Secondary | ICD-10-CM | POA: Diagnosis not present

## 2016-12-26 DIAGNOSIS — Z9889 Other specified postprocedural states: Secondary | ICD-10-CM | POA: Insufficient documentation

## 2016-12-26 DIAGNOSIS — Z8249 Family history of ischemic heart disease and other diseases of the circulatory system: Secondary | ICD-10-CM | POA: Insufficient documentation

## 2016-12-26 DIAGNOSIS — E1121 Type 2 diabetes mellitus with diabetic nephropathy: Secondary | ICD-10-CM | POA: Diagnosis not present

## 2016-12-26 DIAGNOSIS — E669 Obesity, unspecified: Secondary | ICD-10-CM | POA: Insufficient documentation

## 2016-12-26 DIAGNOSIS — I5032 Chronic diastolic (congestive) heart failure: Secondary | ICD-10-CM | POA: Insufficient documentation

## 2016-12-26 DIAGNOSIS — N182 Chronic kidney disease, stage 2 (mild): Secondary | ICD-10-CM | POA: Diagnosis not present

## 2016-12-26 DIAGNOSIS — Z6841 Body Mass Index (BMI) 40.0 and over, adult: Secondary | ICD-10-CM | POA: Insufficient documentation

## 2016-12-26 DIAGNOSIS — I480 Paroxysmal atrial fibrillation: Secondary | ICD-10-CM | POA: Insufficient documentation

## 2016-12-26 DIAGNOSIS — I2510 Atherosclerotic heart disease of native coronary artery without angina pectoris: Secondary | ICD-10-CM | POA: Diagnosis not present

## 2016-12-26 DIAGNOSIS — I5022 Chronic systolic (congestive) heart failure: Secondary | ICD-10-CM

## 2016-12-26 DIAGNOSIS — E109 Type 1 diabetes mellitus without complications: Secondary | ICD-10-CM | POA: Diagnosis not present

## 2016-12-26 MED ORDER — METOLAZONE 2.5 MG PO TABS: 2.5000 mg | ORAL_TABLET | ORAL | 3 refills | Status: DC

## 2016-12-26 MED ORDER — TORSEMIDE 20 MG PO TABS: 60.0000 mg | ORAL_TABLET | Freq: Two times a day (BID) | ORAL | 3 refills | Status: DC

## 2016-12-26 NOTE — Patient Instructions (Signed)
INCREASE Torsemide to 60 mg (3 Tablet) Twice Daily  START taking Metolazone 2.5 mg (1 Tablet) Every Wednesday.  Please start this today.  You have been scheduled for a Heart Catheterization.  Please see attached instructions.  Follow up in 7-10 days.

## 2016-12-26 NOTE — Progress Notes (Signed)
Advanced Heart Failure Medication Review by a Pharmacist  Does the patient  feel that his/her medications are working for him/her?  yes  Has the patient been experiencing any side effects to the medications prescribed?  no  Does the patient measure his/her own blood pressure or blood glucose at home?  yes   Does the patient have any problems obtaining medications due to transportation or finances?   no  Understanding of regimen: good Understanding of indications: good Potential of compliance: good Patient understands to avoid NSAIDs. Patient understands to avoid decongestants.  Issues to address at subsequent visits: none  Pharmacist comments: Dana [name] is a pleasant 56 y/o female new to the clinic who presented with her medication list. She endorses good recollection and adherence to her medications. Patient did not have any medication-related questions or concerns at this time for me.   [name] PharmD [place]  Time with patient: 5 minutes Preparation and documentation time: 10 minutes Total time: 15 minutes

## 2016-12-26 NOTE — Progress Notes (Signed)
ADVANCED HF CLINIC CONSULT NOTE  Referring Physician: Dr Bettina Gavia  Primary Care: Primary Cardiologist: Dr Bettina Gavia  Pulmonary: Dr Alcide Clever  HPI: Dana [name] is a 56 year old woman referred to the [facility] by Dr Bettina Gavia for heart failure.   Dana [name] is a 56 year old with a history of obesity, diastolic heart failure, DMI, neuropathy, HTN, hyperlipidemia, CAD (most recent [facility] 08/2016 DES RCA and stent LAD total of 4 stents), anemia, OSA --> started CPAP 12/2016, PAF S/P ablation, PAD S/P L great toe and 4th toe amputation.   Followed closely Dr Bettina Gavia and hs had difficult to manager HF. She has been requiring IV lasix at home.  Last evaluated April 26th and was volume overloaded at that time so IV lasix was recommended for 2 days. Weight at that time 240 pounds.  Says she lost  3-4 pounds but then the weight went back up. She has been taking torsemide 40 mg twice a day. She does not take metolazone because she had AKI and required hospitalization. Says she went to cardiac rehab 3 years ago.   Overall feeling fair. SOB with steps. SOB with exertion. Complaining of fatigue. Very limited performing house activities. Over the last year she has gained 20-30 pounds. Weight at home 242-249 pounds. She limits salt intake, follows low carb diet, and limits fluid intake to < 2 liters per day. Ambulates with cane. Activity also limited by neuropathy. Taking all medications.    Studies Cardiac cath [facility] 08/30/16:  1. Unstable angina. 2. Severe stenosis of the distal RCA extending into the rPDA. 3. Patent stent in the LAD. 4. Moderate macrovascular disease elsewhere. 5. Diffuse microvascular disease seen. 6. Mild pulmonary hypertension. 7. Normal LVEDP. 8. Normal cardiac output. Interventional Summary Successful PCI / Xience Drug Eluting Stent (2.75/23 mm) of the distal Right RA 7 PCW 14 CO/CO 6.3/3  ECHO 08/2016 EF 55% RV was not clearly visualized  CTA 08/2016 --. No PE       Review of Systems: [y] = yes, [ ]  = no   General: Weight gain [ Y]; Weight loss [ ] ; Anorexia [ ] ; Fatigue [ Y]; Fever [ ] ; Chills [ ] ; Weakness [Y ]  Cardiac: Chest pain/pressure [ ] ; Resting SOB [ ] ; Exertional SOB [Y ]; Orthopnea [Y ]; Pedal Edema [Y ]; Palpitations [ ] ; Syncope [ ] ; Presyncope [ ] ; Paroxysmal nocturnal dyspnea[ ]   Pulmonary: Cough [ ] ; Wheezing[ ] ; Hemoptysis[ ] ; Sputum [ ] ; Snoring [ Y]  GI: Vomiting[ ] ; Dysphagia[ ] ; Melena[ ] ; Hematochezia [ ] ; Heartburn[ ] ; Abdominal pain [ ] ; Constipation [ ] ; Diarrhea [ ] ; BRBPR [ ]   GU: Hematuria[ ] ; Dysuria [ ] ; Nocturia[ ]   Vascular: Pain in legs with walking [Y ]; Pain in feet with lying flat [Y ]; Non-healing sores [ ] ; Stroke [ ] ; TIA [ ] ; Slurred speech [ ] ;  Neuro: Headaches[ ] ; Vertigo[ ] ; Seizures[ ] ; Paresthesias[ ] ;Blurred vision [ ] ; Diplopia [ ] ; Vision changes [ ]   Ortho/Skin: Arthritis [Y ]; Joint pain [Y ]; Muscle pain [ ] ; Joint swelling [ ] ; Back Pain [ ] ; Rash [ ]   Psych: Depression[ ] ; Anxiety[ ]   Heme: Bleeding problems [ ] ; Clotting disorders [ ] ; Anemia [ Y]  Endocrine: Diabetes [Y ]; Thyroid dysfunction[Y ]   No past medical history on file.  Current Outpatient Prescriptions  Medication Sig Dispense Refill  . albuterol (ACCUNEB) 0.63 MG/3ML nebulizer solution Inhale 1 ampule by nebulization every six (6)  hours as needed for wheezing.    Marland Kitchen albuterol (PROVENTIL HFA;VENTOLIN HFA) 108 (90 Base) MCG/ACT inhaler Inhale into the lungs.    . carvedilol (COREG) 3.125 MG tablet Take 3.125 mg by mouth 2 (two) times daily.    . clopidogrel (PLAVIX) 75 MG tablet Take 75 mg by mouth.    . ergocalciferol (VITAMIN D2) 50000 units capsule Take 50,000 Units by mouth once a week.    . eszopiclone (LUNESTA) 2 MG TABS tablet Take 2 mg by mouth.    Marland Kitchen glucagon (GLUCAGON EMERGENCY) 1 MG injection 1 mL.    Marland Kitchen HYDROcodone-acetaminophen (NORCO) 7.5-325 MG tablet Take 1 tablet by mouth 2 (two) times daily.    . insulin NPH  Human (HUMULIN N,NOVOLIN N) 100 UNIT/ML injection Inject 30 units  twice daily. MDD=60 units    . insulin regular (NOVOLIN R,HUMULIN R) 100 units/mL injection Inject 15 Units into the skin 3 (three) times daily before meals.     . latanoprost (XALATAN) 0.005 % ophthalmic solution Place 1 drop into both eyes at bedtime.    Marland Kitchen linaclotide (LINZESS) 145 MCG CAPS capsule Take 1 capsule by mouth daily as needed.     . nitroGLYCERIN (NITROSTAT) 0.4 MG SL tablet Place 0.4 mg under the tongue as needed.    . pantoprazole (PROTONIX) 40 MG tablet Take 40 mg by mouth.    . pravastatin (PRAVACHOL) 40 MG tablet Take 80 mg by mouth.    . pregabalin (LYRICA) 75 MG capsule Take 75 mg by mouth 3 (three) times daily.     . promethazine (PHENERGAN) 25 MG suppository Place 25 mg rectally.    . torsemide (DEMADEX) 20 MG tablet Take 40 mg by mouth 2 (two) times daily.      No current facility-administered medications for this encounter.     Allergies  Allergen Reactions  . Naproxen Hives  . Celecoxib     Other reaction(s): Other (See Comments) Stomach pain  . Aspirin Other (See Comments)    CAN NOT TAKE DUE TO ULCERS  . Metoclopramide     Other reaction(s): Other (See Comments) Facial twitching and stuttering  . Metolazone     Other reaction(s): Other (See Comments) Acute renal failure  . Shellfish-Derived Products     Other reaction(s): Angioedema (ALLERGY/intolerance)  . Tramadol     Other reaction(s): Vomiting (intolerance)      Social History   Social History  . Marital status: Married    Spouse name: N/A  . Number of children: N/A  . Years of education: N/A   Occupational History  . Not on file.   Social History Main Topics  . Smoking status: Not on file  . Smokeless tobacco: Not on file  . Alcohol use Not on file  . Drug use: Unknown  . Sexual activity: Not on file   Other Topics Concern  . Not on file   Social History Narrative  . No narrative on file    FamHx:  Denies  h/o familial CM or SCD. Multiple family members with CAD.   Vitals:   12/26/16 1147  BP: (!) 160/78  Pulse: 96  SpO2: 95%  Weight: 251 lb 12.8 oz (114.2 kg)  Height: 5\' 4"  (1.626 m)    PHYSICAL EXAM: General: Fatigued.  No respiratory difficulty.  Ambulates with a cane HEENT: normal Neck: supple. JVP ~10 . Carotids 2+ bilat; no bruits. No lymphadenopathy or thryomegaly appreciated. Cor: PMI nondisplaced. Regular rate & rhythm. No rubs, gallops or murmurs.  Lungs: clear Abdomen: round , soft, nontender, nondistended. No hepatosplenomegaly. No bruits or masses. Good bowel sounds. Extremities: no cyanosis, clubbing, rash, R and LLE 2+ edema Neuro: alert & oriented x 3, cranial nerves grossly intact. moves all 4 extremities w/o difficulty. Affect pleasant.  ECG: NSR 94 bpm Low volts. No significant ST-T abnormalities  Personally reviewed    ASSESSMENT & PLAN:  1. Chronic Diastolic Heart Failure- ECHO 08/2016 EF 55%.  NYHA III. Volume status elevated despite torsemide 40 mg twice a day. Most recent creatinine 4/30 was 0.89.  Increase torsemide to 60 mg twice a day and add 2.5 mg metolazone once a week.  Consider cardiomems - chest circumference 124 cm. We discussed and she would like to pursue.  Do the following things EVERYDAY: 1) Weigh yourself in the morning before breakfast. Write it down and keep it in a log. 2) Take your medicines as prescribed 3) Eat low salt foods-Limit salt (sodium) to 2000 mg per day.  4) Stay as active as you can everyday 5) Limit all fluids for the day to less than 2 liters  2. Obesity- Body mass index is 43.22 kg/m. 3. OSA- uses CPAP 4. Anemia- Hgb 9.7 on April 30th  5. CAD- 4 stents. Most recent stent RCA. CP every now and then. On plavix, bb, and statin. Plan cath to further evaluate 6. PAF- S/P ablation 2016. Todays she is in NSR.  7. HTN: Elelvated see if diuresis improves.   Follow up 7-10 days with BMET/BNP   Amy Clegg 1:16 PM  Patient  seen and examined with Darrick Grinder, NP. We discussed all aspects of the encounter. I agree with the assessment and plan as stated above.   Difficult situation she has DM1 with multiple comorbidities including CAD, PAD, diastolic HF and OSA. Her volume status has been very difficult to manage. She reports compliance with dietary restriction but I am still not clear on this. She is overloaded today. Will increase torsemide as above and use metolazone 2.5 once a week (every Wednesday) - to use only if swelling. Can also consider addition of spironolactone. Long talk about HF education as well as potential Cardiomems placement which she is interested in.  Given CP and difficult to manage HF will plan R/L cath.    Will see back next week with labs.   [name], MD  11:45 PM

## 2017-01-01 ENCOUNTER — Ambulatory Visit (HOSPITAL_COMMUNITY)
Admission: RE | Admit: 2017-01-01 | Discharge: 2017-01-01 | Disposition: A | Discharge status: Home / Self Care | Payer: Medicare Other | Source: Ambulatory Visit | Attending: Internal Medicine | Admitting: Internal Medicine

## 2017-01-01 VITALS — BP 136/54 | HR 102 | Wt 253.0 lb

## 2017-01-01 DIAGNOSIS — I10 Essential (primary) hypertension: Secondary | ICD-10-CM

## 2017-01-01 DIAGNOSIS — D649 Anemia, unspecified: Secondary | ICD-10-CM | POA: Diagnosis not present

## 2017-01-01 DIAGNOSIS — Z7902 Long term (current) use of antithrombotics/antiplatelets: Secondary | ICD-10-CM | POA: Insufficient documentation

## 2017-01-01 DIAGNOSIS — Z955 Presence of coronary angioplasty implant and graft: Secondary | ICD-10-CM | POA: Diagnosis not present

## 2017-01-01 DIAGNOSIS — Z794 Long term (current) use of insulin: Secondary | ICD-10-CM | POA: Diagnosis not present

## 2017-01-01 DIAGNOSIS — Z888 Allergy status to other drugs, medicaments and biological substances status: Secondary | ICD-10-CM | POA: Insufficient documentation

## 2017-01-01 DIAGNOSIS — E669 Obesity, unspecified: Secondary | ICD-10-CM | POA: Diagnosis not present

## 2017-01-01 DIAGNOSIS — E785 Hyperlipidemia, unspecified: Secondary | ICD-10-CM | POA: Insufficient documentation

## 2017-01-01 DIAGNOSIS — Z9111 Patient's noncompliance with dietary regimen: Secondary | ICD-10-CM | POA: Insufficient documentation

## 2017-01-01 DIAGNOSIS — I2510 Atherosclerotic heart disease of native coronary artery without angina pectoris: Secondary | ICD-10-CM | POA: Diagnosis not present

## 2017-01-01 DIAGNOSIS — I5032 Chronic diastolic (congestive) heart failure: Secondary | ICD-10-CM | POA: Diagnosis not present

## 2017-01-01 DIAGNOSIS — Z9889 Other specified postprocedural states: Secondary | ICD-10-CM | POA: Insufficient documentation

## 2017-01-01 DIAGNOSIS — I480 Paroxysmal atrial fibrillation: Secondary | ICD-10-CM | POA: Diagnosis not present

## 2017-01-01 DIAGNOSIS — E119 Type 2 diabetes mellitus without complications: Secondary | ICD-10-CM | POA: Insufficient documentation

## 2017-01-01 DIAGNOSIS — R079 Chest pain, unspecified: Secondary | ICD-10-CM | POA: Insufficient documentation

## 2017-01-01 DIAGNOSIS — Z8249 Family history of ischemic heart disease and other diseases of the circulatory system: Secondary | ICD-10-CM | POA: Diagnosis not present

## 2017-01-01 DIAGNOSIS — Z6841 Body Mass Index (BMI) 40.0 and over, adult: Secondary | ICD-10-CM | POA: Insufficient documentation

## 2017-01-01 DIAGNOSIS — R0602 Shortness of breath: Secondary | ICD-10-CM | POA: Insufficient documentation

## 2017-01-01 DIAGNOSIS — Z79899 Other long term (current) drug therapy: Secondary | ICD-10-CM | POA: Insufficient documentation

## 2017-01-01 DIAGNOSIS — I110 Hypertensive heart disease with heart failure: Secondary | ICD-10-CM | POA: Diagnosis not present

## 2017-01-01 DIAGNOSIS — G4733 Obstructive sleep apnea (adult) (pediatric): Secondary | ICD-10-CM | POA: Insufficient documentation

## 2017-01-01 HISTORY — DX: Hyperlipidemia, unspecified: E78.5

## 2017-01-01 HISTORY — DX: Atherosclerotic heart disease of native coronary artery without angina pectoris: I25.10

## 2017-01-01 HISTORY — DX: Unspecified diastolic (congestive) heart failure: I50.30

## 2017-01-01 HISTORY — DX: Obstructive sleep apnea (adult) (pediatric): G47.33

## 2017-01-01 HISTORY — DX: Type 2 diabetes mellitus without complications: E11.9

## 2017-01-01 HISTORY — DX: Essential (primary) hypertension: I10

## 2017-01-01 LAB — CBC
HCT: 27.7 % — ABNORMAL LOW (ref 36.0–46.0)
Hemoglobin: 8.8 g/dL — ABNORMAL LOW (ref 12.0–15.0)
MCH: 25.9 pg — ABNORMAL LOW (ref 26.0–34.0)
MCHC: 31.8 g/dL (ref 30.0–36.0)
MCV: 81.5 fL (ref 78.0–100.0)
Platelets: 312 10*3/uL (ref 150–400)
RBC: 3.4 MIL/uL — ABNORMAL LOW (ref 3.87–5.11)
RDW: 14.3 % (ref 11.5–15.5)
WBC: 11.7 10*3/uL — ABNORMAL HIGH (ref 4.0–10.5)

## 2017-01-01 LAB — BASIC METABOLIC PANEL
Anion gap: 10 (ref 5–15)
BUN: 37 mg/dL — ABNORMAL HIGH (ref 6–20)
CO2: 25 mmol/L (ref 22–32)
Calcium: 8.5 mg/dL — ABNORMAL LOW (ref 8.9–10.3)
Chloride: 100 mmol/L — ABNORMAL LOW (ref 101–111)
Creatinine, Ser: 1.29 mg/dL — ABNORMAL HIGH (ref 0.44–1.00)
GFR calc Af Amer: 54 mL/min — ABNORMAL LOW (ref >60)
GFR calc non Af Amer: 46 mL/min — ABNORMAL LOW (ref >60)
Glucose, Bld: 248 mg/dL — ABNORMAL HIGH (ref 65–99)
Potassium: 4 mmol/L (ref 3.5–5.1)
Sodium: 135 mmol/L (ref 135–145)

## 2017-01-01 LAB — PROTIME-INR
INR: 1.08
Prothrombin Time: 14 seconds (ref 11.4–15.2)

## 2017-01-01 MED ORDER — TORSEMIDE 20 MG PO TABS: 80.0000 mg | ORAL_TABLET | Freq: Two times a day (BID) | ORAL | 3 refills | Status: DC

## 2017-01-01 NOTE — Progress Notes (Signed)
Advanced Heart Failure Medication Review by a Pharmacist  Does the patient  feel that his/her medications are working for him/her?  yes  Has the patient been experiencing any side effects to the medications prescribed?  no  Does the patient measure his/her own blood pressure or blood glucose at home?  yes   Does the patient have any problems obtaining medications due to transportation or finances?   no  Understanding of regimen: good Understanding of indications: good Potential of compliance: good Patient understands to avoid NSAIDs. Patient understands to avoid decongestants.  Issues to address at subsequent visits: none.   Pharmacist comments: Mrs. Dwyer is a pleasant 56 y/o female who presents with no medication bottles or list but good recollection of her medication regimen. Patient reports good adherence to her medications, and she had no medication-related questions or concerns at this time.   [name] PharmD Candidate   Time with patient: 10 minutes Preparation and documentation time: 5 minutes Total time: 15 minutes

## 2017-01-01 NOTE — Patient Instructions (Addendum)
Routine lab work today. Will notify you of abnormal results, otherwise no news is good news!  INCREASE Torsemide to 80 mg (4 tabs) twice daily.  Take metolazone 2.5 mg once today and once tomorrow only.  Follow up 1 month. Take all medication as prescribed the day of your appointment. Bring all medications with you to your appointment.  Do the following things EVERYDAY: 1) Weigh yourself in the morning before breakfast. Write it down and keep it in a log. 2) Take your medicines as prescribed 3) Eat low salt foods-Limit salt (sodium) to 2000 mg per day.  4) Stay as active as you can everyday 5) Limit all fluids for the day to less than 2 liters

## 2017-01-01 NOTE — Progress Notes (Signed)
ADVANCED HF CLINIC CONSULT NOTE  Referring Physician: Dr Bettina Gavia  Primary Care: Primary Cardiologist: Dr Bettina Gavia  Pulmonary: Dr Alcide Clever  HPI: Dana [name] is a 56 year old woman referred to the [facility] by Dr Bettina Gavia for heart failure.   Dana [name] is a 56 year old with a history of obesity, diastolic heart failure, DMI, neuropathy, HTN, hyperlipidemia, CAD (most recent [facility] 08/2016 DES RCA and stent LAD total of 4 stents), anemia, OSA --> started CPAP 12/2016, PAF S/P ablation, PAD S/P L great toe and 4th toe amputation.   Followed closely Dr Bettina Gavia and hs had difficult to manager HF. She has been requiring IV lasix at home.  Last evaluated April 26th and was volume overloaded at that time so IV lasix was recommended for 2 days. Weight at that time 240 pounds.  Says she lost  3-4 pounds but then the weight went back up. She has been taking torsemide 40 mg twice a day. She does not take metolazone because she had AKI and required hospitalization. Says she went to cardiac rehab 3 years ago.   She presents today for HF follow up. She feels ok overall, but weight is still up 2 pounds from last visit. She increased her torsemide and took metolazone last week and did not have increased urination. However, she is eating a high sodium diet, had [place] food prior to today's appointment. She has been drinking well over 2L a day. She says that she was unaware of sodium restriction. Also having intermittent chest pain, not consistent with her anginal equivalent. Feels SOB with walking, SOB with ADL's.   Studies Cardiac cath [facility] 08/30/16:  1. Unstable angina. 2. Severe stenosis of the distal RCA extending into the rPDA. 3. Patent stent in the LAD. 4. Moderate macrovascular disease elsewhere. 5. Diffuse microvascular disease seen. 6. Mild pulmonary hypertension. 7. Normal LVEDP. 8. Normal cardiac output. Interventional Summary Successful PCI / Xience Drug Eluting Stent (2.75/23 mm)  of the distal Right RA 7 PCW 14 CO/CO 6.3/3  ECHO 08/2016 EF 55% RV was not clearly visualized  CTA 08/2016 --. No PE      Past Medical History:  Diagnosis Date  . CAD (coronary artery disease)    08/2016 DES to RCA, also with history of LAD stenting.   . Diabetes ([place])   . Diastolic CHF ([place])   . Hyperlipidemia   . Hypertension   . OSA (obstructive sleep apnea)     Current Outpatient Prescriptions  Medication Sig Dispense Refill  . albuterol (ACCUNEB) 0.63 MG/3ML nebulizer solution Inhale 1 ampule by nebulization every six (6) hours as needed for wheezing.    Marland Kitchen albuterol (PROVENTIL HFA;VENTOLIN HFA) 108 (90 Base) MCG/ACT inhaler Inhale into the lungs.    . carvedilol (COREG) 3.125 MG tablet Take 3.125 mg by mouth 2 (two) times daily.    . clopidogrel (PLAVIX) 75 MG tablet Take 75 mg by mouth.    . ergocalciferol (VITAMIN D2) 50000 units capsule Take 50,000 Units by mouth once a week.    . eszopiclone (LUNESTA) 2 MG TABS tablet Take 2 mg by mouth.    Marland Kitchen glucagon (GLUCAGON EMERGENCY) 1 MG injection 1 mL.    Marland Kitchen HYDROcodone-acetaminophen (NORCO) 7.5-325 MG tablet Take 1 tablet by mouth 2 (two) times daily.    . insulin NPH Human (HUMULIN N,NOVOLIN N) 100 UNIT/ML injection Inject 30 units [place] twice daily. MDD=60 units    . insulin regular (NOVOLIN R,HUMULIN R) 100 units/mL injection  Inject 15 Units into the skin 3 (three) times daily before meals.     . latanoprost (XALATAN) 0.005 % ophthalmic solution Place 1 drop into both eyes at bedtime.    Marland Kitchen linaclotide (LINZESS) 145 MCG CAPS capsule Take 1 capsule by mouth daily as needed.     . metolazone (ZAROXOLYN) 2.5 MG tablet Take 1 tablet (2.5 mg total) by mouth once a week. Take Every Wednesday 15 tablet 3  . nitroGLYCERIN (NITROSTAT) 0.4 MG SL tablet Place 0.4 mg under the tongue as needed.    . pantoprazole (PROTONIX) 40 MG tablet Take 40 mg by mouth.    . pravastatin (PRAVACHOL) 40 MG tablet Take 80 mg by mouth.    . pregabalin (LYRICA)  75 MG capsule Take 75 mg by mouth 3 (three) times daily.     . promethazine (PHENERGAN) 25 MG tablet Take 25 mg by mouth every 6 (six) hours as needed for nausea or vomiting.    . torsemide (DEMADEX) 20 MG tablet Take 4 tablets (80 mg total) by mouth 2 (two) times daily. 180 tablet 3   No current facility-administered medications for this encounter.     Allergies  Allergen Reactions  . Naproxen Hives  . Celecoxib     Other reaction(s): Other (See Comments) Stomach pain  . Aspirin Other (See Comments)    CAN NOT TAKE DUE TO ULCERS  . Metoclopramide     Other reaction(s): Other (See Comments) Facial twitching and stuttering  . Metolazone     Other reaction(s): Other (See Comments) Acute renal failure  . Shellfish-Derived Products     Other reaction(s): Angioedema (ALLERGY/intolerance)  . Tramadol     Other reaction(s): Vomiting (intolerance)      Social History   Social History  . Marital status: Married    Spouse name: N/A  . Number of children: N/A  . Years of education: N/A   Occupational History  . Not on file.   Social History Main Topics  . Smoking status: Not on file  . Smokeless tobacco: Not on file  . Alcohol use Not on file  . Drug use: Unknown  . Sexual activity: Not on file   Other Topics Concern  . Not on file   Social History Narrative  . No narrative on file    FamHx:  Denies h/o familial CM or SCD. Multiple family members with CAD.   Vitals:   01/01/17 1510  BP: (!) 136/54  Pulse: (!) 102  SpO2: 98%  Weight: 253 lb (114.8 kg)    PHYSICAL EXAM: General: Fatigued appearing female. NAD. Arrived in wheelchair.  HEENT: normal Neck: supple. JVP to jaw. Carotids 2+ bilat; no bruits. No lymphadenopathy or thryomegaly appreciated. Cor: PMI nondisplaced. Regular rate and rhythm.No rubs, gallops or murmurs. Lungs: Fine crackles in bilateral bases, clear in upper lobes. Normal effort.  Abdomen: Obese, soft, nontender, nondistended. No  hepatosplenomegaly. No bruits or masses. Good bowel sounds. Extremities: no cyanosis, clubbing, rash, 3+ edema to knees.  Neuro: alert & oriented x 3, cranial nerves grossly intact. moves all 4 extremities w/o difficulty. Affect pleasant.  ECG: NSR 94 bpm Low volts. No significant ST-T abnormalities  Personally reviewed    ASSESSMENT & PLAN:  1. Chronic Diastolic Heart Failure- ECHO 08/2016 EF 55%.  - NYHA III - Volume overloaded on exam in the setting of dietary noncompliance. Talked to her at length about limiting sodium in her diet as well as restricting fluids to less than 2L a day.  -  Increase torsemide to 80mg  BID.  - Take metolazone today and tomorrow.  - She will be seen on 01/07/17 for a R/L heart cath.  - She will call the office if she does not lose weight or feels more SOB. Instructed her to call 911 if she develops chest pain.  - Consider Cardiomems - chest circumference 124 cm.  2. Obesity- Body mass index is 43.43 kg/m.  - Encouraged her to stop eating out, limit portion size.  3. OSA - Using CPAP nightly  4. Anemia - CBC today.  5. CAD: s/p RCA stenting in 08/2016 - Continue Plavix and ASA - Intermittent chest pain, instructed her to call 911 if she develops chest pain.  6. PAF- S/P ablation 2016.  - NSR today.  - Not on any anticoagulation. 7. HTN: - Well controlled on current regimen.   Pre cath labs today.  For R/L heart cath on 6/25.   Arbutus Leas 1:46 PM

## 2017-01-02 ENCOUNTER — Encounter (HOSPITAL_COMMUNITY): Payer: Self-pay

## 2017-01-04 ENCOUNTER — Ambulatory Visit (HOSPITAL_COMMUNITY)
Admission: RE | Admit: 2017-01-04 | Discharge: 2017-01-04 | Disposition: A | Discharge status: Home / Self Care | Payer: Medicare Other | Source: Ambulatory Visit | Attending: Cardiology | Admitting: Cardiology

## 2017-01-04 ENCOUNTER — Telehealth (HOSPITAL_COMMUNITY): Payer: Self-pay | Admitting: *Deleted

## 2017-01-04 DIAGNOSIS — I5032 Chronic diastolic (congestive) heart failure: Secondary | ICD-10-CM

## 2017-01-04 LAB — BASIC METABOLIC PANEL
Anion gap: 11 (ref 5–15)
BUN: 59 mg/dL — ABNORMAL HIGH (ref 6–20)
CO2: 28 mmol/L (ref 22–32)
Calcium: 8.8 mg/dL — ABNORMAL LOW (ref 8.9–10.3)
Chloride: 93 mmol/L — ABNORMAL LOW (ref 101–111)
Creatinine, Ser: 1.45 mg/dL — ABNORMAL HIGH (ref 0.44–1.00)
GFR calc Af Amer: 47 mL/min — ABNORMAL LOW (ref >60)
GFR calc non Af Amer: 40 mL/min — ABNORMAL LOW (ref >60)
Glucose, Bld: 256 mg/dL — ABNORMAL HIGH (ref 65–99)
Potassium: 4 mmol/L (ref 3.5–5.1)
Sodium: 132 mmol/L — ABNORMAL LOW (ref 135–145)

## 2017-01-04 NOTE — Telephone Encounter (Signed)
Please have her come in today for a BMET

## 2017-01-04 NOTE — Telephone Encounter (Signed)
Pt aware, she will come for STAT bmet to check K level asap.

## 2017-01-04 NOTE — Telephone Encounter (Signed)
Pt called to report she is not feeling well, she states her wt is down about 2-3 lb since Tue, her BP is down to 95/48 today and she feel like her "whole body is cramping"  She states she has been monitoring her intake/output.  She reports edema is much improved from [place] as well.  Wed 1700 in 4000 out Thur 1500 in 3500 out Today   600 out  She did take the Metolazone for 2 days and increased her Lasix to 80 mg BID as instructed, she is not on any potassium, labs from 6/19 show K 4.0  Will send to MD for review

## 2017-01-04 NOTE — Telephone Encounter (Signed)
bmet showed K 4.0 per Junie Panning:  Notes recorded by Arbutus Leas, NP on 01/04/2017 at 2:41 PM EDT Electrolytes stable. Please call her and let her know. If she continues to have body aches can see PCP.   Pt aware and agreeable

## 2017-01-07 ENCOUNTER — Encounter (HOSPITAL_COMMUNITY)
Admission: RE | Disposition: A | Discharge status: Home / Self Care | Payer: Self-pay | Source: Ambulatory Visit | Attending: Internal Medicine

## 2017-01-07 ENCOUNTER — Ambulatory Visit (HOSPITAL_COMMUNITY)
Admission: RE | Admit: 2017-01-07 | Discharge: 2017-01-07 | Disposition: A | Discharge status: Home / Self Care | Payer: Medicare Other | Source: Ambulatory Visit | Attending: Internal Medicine | Admitting: Internal Medicine

## 2017-01-07 ENCOUNTER — Ambulatory Visit (HOSPITAL_COMMUNITY): Admission: RE | Admit: 2017-01-07 | Payer: Medicare Other | Source: Ambulatory Visit | Admitting: Internal Medicine

## 2017-01-07 DIAGNOSIS — Z9111 Patient's noncompliance with dietary regimen: Secondary | ICD-10-CM | POA: Diagnosis not present

## 2017-01-07 DIAGNOSIS — I2510 Atherosclerotic heart disease of native coronary artery without angina pectoris: Secondary | ICD-10-CM | POA: Diagnosis not present

## 2017-01-07 DIAGNOSIS — D649 Anemia, unspecified: Secondary | ICD-10-CM | POA: Insufficient documentation

## 2017-01-07 DIAGNOSIS — I5032 Chronic diastolic (congestive) heart failure: Secondary | ICD-10-CM | POA: Diagnosis not present

## 2017-01-07 DIAGNOSIS — Z8249 Family history of ischemic heart disease and other diseases of the circulatory system: Secondary | ICD-10-CM | POA: Diagnosis not present

## 2017-01-07 DIAGNOSIS — E669 Obesity, unspecified: Secondary | ICD-10-CM | POA: Diagnosis not present

## 2017-01-07 DIAGNOSIS — I5022 Chronic systolic (congestive) heart failure: Secondary | ICD-10-CM

## 2017-01-07 DIAGNOSIS — E785 Hyperlipidemia, unspecified: Secondary | ICD-10-CM | POA: Diagnosis not present

## 2017-01-07 DIAGNOSIS — Z955 Presence of coronary angioplasty implant and graft: Secondary | ICD-10-CM | POA: Diagnosis not present

## 2017-01-07 DIAGNOSIS — E1040 Type 1 diabetes mellitus with diabetic neuropathy, unspecified: Secondary | ICD-10-CM | POA: Diagnosis not present

## 2017-01-07 DIAGNOSIS — Z91013 Allergy to seafood: Secondary | ICD-10-CM | POA: Diagnosis not present

## 2017-01-07 DIAGNOSIS — I509 Heart failure, unspecified: Secondary | ICD-10-CM | POA: Diagnosis not present

## 2017-01-07 DIAGNOSIS — G4733 Obstructive sleep apnea (adult) (pediatric): Secondary | ICD-10-CM | POA: Insufficient documentation

## 2017-01-07 DIAGNOSIS — Z7902 Long term (current) use of antithrombotics/antiplatelets: Secondary | ICD-10-CM | POA: Insufficient documentation

## 2017-01-07 DIAGNOSIS — Z6841 Body Mass Index (BMI) 40.0 and over, adult: Secondary | ICD-10-CM | POA: Diagnosis not present

## 2017-01-07 DIAGNOSIS — I110 Hypertensive heart disease with heart failure: Secondary | ICD-10-CM | POA: Insufficient documentation

## 2017-01-07 HISTORY — PX: RIGHT/LEFT HEART CATH AND CORONARY ANGIOGRAPHY: CATH118266

## 2017-01-07 LAB — POCT I-STAT 3, VENOUS BLOOD GAS (G3P V)
Acid-Base Excess: 6 mmol/L — ABNORMAL HIGH (ref 0.0–2.0)
Acid-Base Excess: 2 mmol/L (ref 0.0–2.0)
Bicarbonate: 31.4 mmol/L — ABNORMAL HIGH (ref 20.0–28.0)
Bicarbonate: 26.9 mmol/L (ref 20.0–28.0)
O2 Saturation: 67 %
O2 Saturation: 64 %
TCO2: 33 mmol/L (ref 0–100)
TCO2: 28 mmol/L (ref 0–100)
pCO2, Ven: 47.7 mmHg (ref 44.0–60.0)
pCO2, Ven: 42.9 mmHg — ABNORMAL LOW (ref 44.0–60.0)
pH, Ven: 7.426 (ref 7.250–7.430)
pH, Ven: 7.406 (ref 7.250–7.430)
pO2, Ven: 34 mmHg (ref 32.0–45.0)
pO2, Ven: 33 mmHg (ref 32.0–45.0)

## 2017-01-07 LAB — POCT I-STAT, CHEM 8
BUN: 87 mg/dL — ABNORMAL HIGH (ref 6–20)
Calcium, Ion: 1.1 mmol/L — ABNORMAL LOW (ref 1.15–1.40)
Chloride: 94 mmol/L — ABNORMAL LOW (ref 101–111)
Creatinine, Ser: 1.5 mg/dL — ABNORMAL HIGH (ref 0.44–1.00)
Glucose, Bld: 121 mg/dL — ABNORMAL HIGH (ref 65–99)
HCT: 33 % — ABNORMAL LOW (ref 36.0–46.0)
Hemoglobin: 11.2 g/dL — ABNORMAL LOW (ref 12.0–15.0)
Potassium: 4 mmol/L (ref 3.5–5.1)
Sodium: 137 mmol/L (ref 135–145)
TCO2: 31 mmol/L (ref 0–100)

## 2017-01-07 LAB — GLUCOSE, CAPILLARY
Glucose-Capillary: 131 mg/dL — ABNORMAL HIGH (ref 65–99)
Glucose-Capillary: 96 mg/dL (ref 65–99)

## 2017-01-07 SURGERY — RIGHT/LEFT HEART CATH AND CORONARY ANGIOGRAPHY
Anesthesia: LOCAL

## 2017-01-07 MED ORDER — IOPAMIDOL (ISOVUE-370) INJECTION 76%
INTRAVENOUS | Status: AC
  Filled 2017-01-07: qty 100

## 2017-01-07 MED ORDER — SODIUM CHLORIDE 0.9% FLUSH: 3.0000 mL | Freq: Two times a day (BID) | INTRAVENOUS | Status: DC

## 2017-01-07 MED ORDER — SODIUM CHLORIDE 0.9% FLUSH: 3.0000 mL | INTRAVENOUS | Status: DC | PRN

## 2017-01-07 MED ORDER — SODIUM CHLORIDE 0.9 % WEIGHT BASED INFUSION: 1.0000 mL/kg/h | INTRAVENOUS | Status: AC

## 2017-01-07 MED ORDER — ASPIRIN 81 MG PO CHEW
81.0000 mg | CHEWABLE_TABLET | ORAL | Status: AC
  Administered 2017-01-07: 81 mg via ORAL

## 2017-01-07 MED ORDER — LIDOCAINE HCL 1 % IJ SOLN
INTRAMUSCULAR | Status: AC
  Filled 2017-01-07: qty 20

## 2017-01-07 MED ORDER — ACETAMINOPHEN 325 MG PO TABS: 650.0000 mg | ORAL_TABLET | ORAL | Status: DC | PRN

## 2017-01-07 MED ORDER — LIDOCAINE HCL (PF) 1 % IJ SOLN
INTRAMUSCULAR | Status: DC | PRN
  Administered 2017-01-07: 15 mL
  Administered 2017-01-07: 2 mL

## 2017-01-07 MED ORDER — HEPARIN (PORCINE) IN NACL 2-0.9 UNIT/ML-% IJ SOLN
INTRAMUSCULAR | Status: AC
  Filled 2017-01-07: qty 1000

## 2017-01-07 MED ORDER — SODIUM CHLORIDE 0.9 % IV SOLN
250.0000 mL | INTRAVENOUS | Status: DC | PRN
Start: 2017-01-07 — End: 2017-01-07

## 2017-01-07 MED ORDER — MIDAZOLAM HCL 2 MG/2ML IJ SOLN
INTRAMUSCULAR | Status: AC
  Filled 2017-01-07: qty 2

## 2017-01-07 MED ORDER — MIDAZOLAM HCL 2 MG/2ML IJ SOLN
INTRAMUSCULAR | Status: DC | PRN
  Administered 2017-01-07: 1 mg via INTRAVENOUS

## 2017-01-07 MED ORDER — ONDANSETRON HCL 4 MG/2ML IJ SOLN: 4.0000 mg | Freq: Four times a day (QID) | INTRAMUSCULAR | Status: DC | PRN

## 2017-01-07 MED ORDER — HEPARIN (PORCINE) IN NACL 2-0.9 UNIT/ML-% IJ SOLN
INTRAMUSCULAR | Status: AC | PRN
  Administered 2017-01-07: 1000 mL

## 2017-01-07 MED ORDER — FENTANYL CITRATE (PF) 100 MCG/2ML IJ SOLN
INTRAMUSCULAR | Status: DC | PRN
  Administered 2017-01-07: 25 ug via INTRAVENOUS

## 2017-01-07 MED ORDER — IOPAMIDOL (ISOVUE-370) INJECTION 76%
INTRAVENOUS | Status: DC | PRN
  Administered 2017-01-07: 70 mL via INTRA_ARTERIAL

## 2017-01-07 MED ORDER — SODIUM CHLORIDE 0.9 % IV SOLN
INTRAVENOUS | Status: DC
  Administered 2017-01-07: 10:00:00 via INTRAVENOUS

## 2017-01-07 MED ORDER — FENTANYL CITRATE (PF) 100 MCG/2ML IJ SOLN
INTRAMUSCULAR | Status: AC
  Filled 2017-01-07: qty 2

## 2017-01-07 MED ORDER — HYDROCODONE-ACETAMINOPHEN 5-325 MG PO TABS
1.0000 | ORAL_TABLET | Freq: Once | ORAL | Status: AC
  Administered 2017-01-07: 2 via ORAL

## 2017-01-07 MED ORDER — HYDROCODONE-ACETAMINOPHEN 5-325 MG PO TABS
ORAL_TABLET | ORAL | Status: AC
  Filled 2017-01-07: qty 2

## 2017-01-07 SURGICAL SUPPLY — 10 items
CATH BALLN WEDGE 5F 110CM (CATHETERS) ×2 IMPLANT
CATH INFINITI 5 FR 3DRC (CATHETERS) ×2 IMPLANT
CATH INFINITI 5FR MULTPACK ANG (CATHETERS) ×2 IMPLANT
KIT HEART LEFT (KITS) ×2 IMPLANT
PACK CARDIAC CATHETERIZATION (CUSTOM PROCEDURE TRAY) ×2 IMPLANT
SHEATH GLIDE SLENDER 4/5FR (SHEATH) ×2 IMPLANT
SHEATH PINNACLE 5F 10CM (SHEATH) ×2 IMPLANT
TRANSDUCER W/STOPCOCK (MISCELLANEOUS) ×2 IMPLANT
TUBING CIL FLEX 10 FLL-RA (TUBING) ×2 IMPLANT
WIRE EMERALD 3MM-J .035X150CM (WIRE) ×2 IMPLANT

## 2017-01-07 NOTE — Interval H&P Note (Signed)
History and Physical Interval Note:  01/07/2017 12:50 PM  Dana [name]  has presented today for surgery, with the diagnosis of chf  The various methods of treatment have been discussed with the patient and family. After consideration of risks, benefits and other options for treatment, the patient has consented to  Procedure(s): Right/Left Heart Cath and Coronary Angiography (N/A) as a surgical intervention .  The patient's history has been reviewed, patient examined, no change in status, stable for surgery.  I have reviewed the patient's chart and labs.  Questions were answered to the patient's satisfaction.     Mylena Sedberry [employer]

## 2017-01-07 NOTE — Progress Notes (Signed)
Site area: rt groin fa sheath Site Prior to Removal:  Level 0 Pressure Applied For: 20 minutes Manual:   yes Patient Status During Pull:  stable Post Pull Site:  Level 0 Post Pull Instructions Given:  yes Post Pull Pulses Present: palpable Dressing Applied:  Gauze and tegaderm Bedrest begins @ [id number] Comments:

## 2017-01-07 NOTE — Progress Notes (Signed)
Site area: rt brachial venous sheath Site Prior to Removal:  Level 0 Pressure Applied For: 10 minutes Manual:   yes Patient Status During Pull:  stable Post Pull Site:  Level 0 Post Pull Instructions Given:  yes Post Pull Pulses Present: palpable Dressing Applied:  Gauze and tegaderm Bedrest begins @  Comments:

## 2017-01-07 NOTE — H&P (View-Only) (Signed)
ADVANCED HF CLINIC CONSULT NOTE  Referring Physician: Dr Bettina Gavia  Primary Care: Primary Cardiologist: Dr Bettina Gavia  Pulmonary: Dr Alcide Clever  HPI: Dana [name] is a 56 year old woman referred to the [facility] by Dr Bettina Gavia for heart failure.   Dana Zendejas is a 56 year old with a history of obesity, diastolic heart failure, DMI, neuropathy, HTN, hyperlipidemia, CAD (most recent [facility] 08/2016 DES RCA and stent LAD total of 4 stents), anemia, OSA --> started CPAP 12/2016, PAF S/P ablation, PAD S/P L great toe and 4th toe amputation.   Followed closely Dr Bettina Gavia and hs had difficult to manager HF. She has been requiring IV lasix at home.  Last evaluated April 26th and was volume overloaded at that time so IV lasix was recommended for 2 days. Weight at that time 240 pounds.  Says she lost  3-4 pounds but then the weight went back up. She has been taking torsemide 40 mg twice a day. She does not take metolazone because she had AKI and required hospitalization. Says she went to cardiac rehab 3 years ago.   She presents today for HF follow up. She feels ok overall, but weight is still up 2 pounds from last visit. She increased her torsemide and took metolazone last week and did not have increased urination. However, she is eating a high sodium diet, had [place] food prior to today's appointment. She has been drinking well over 2L a day. She says that she was unaware of sodium restriction. Also having intermittent chest pain, not consistent with her anginal equivalent. Feels SOB with walking, SOB with ADL's.   Studies Cardiac cath [facility] 08/30/16:  1. Unstable angina. 2. Severe stenosis of the distal RCA extending into the rPDA. 3. Patent stent in the LAD. 4. Moderate macrovascular disease elsewhere. 5. Diffuse microvascular disease seen. 6. Mild pulmonary hypertension. 7. Normal LVEDP. 8. Normal cardiac output. Interventional Summary Successful PCI / Xience Drug Eluting Stent (2.75/23 mm)  of the distal Right RA 7 PCW 14 CO/CO 6.3/3  ECHO 08/2016 EF 55% RV was not clearly visualized  CTA 08/2016 --. No PE      Past Medical History:  Diagnosis Date  . CAD (coronary artery disease)    08/2016 DES to RCA, also with history of LAD stenting.   . Diabetes ([place])   . Diastolic CHF ([place])   . Hyperlipidemia   . Hypertension   . OSA (obstructive sleep apnea)     Current Outpatient Prescriptions  Medication Sig Dispense Refill  . albuterol (ACCUNEB) 0.63 MG/3ML nebulizer solution Inhale 1 ampule by nebulization every six (6) hours as needed for wheezing.    Marland Kitchen albuterol (PROVENTIL HFA;VENTOLIN HFA) 108 (90 Base) MCG/ACT inhaler Inhale into the lungs.    . carvedilol (COREG) 3.125 MG tablet Take 3.125 mg by mouth 2 (two) times daily.    . clopidogrel (PLAVIX) 75 MG tablet Take 75 mg by mouth.    . ergocalciferol (VITAMIN D2) 50000 units capsule Take 50,000 Units by mouth once a week.    . eszopiclone (LUNESTA) 2 MG TABS tablet Take 2 mg by mouth.    Marland Kitchen glucagon (GLUCAGON EMERGENCY) 1 MG injection 1 mL.    Marland Kitchen HYDROcodone-acetaminophen (NORCO) 7.5-325 MG tablet Take 1 tablet by mouth 2 (two) times daily.    . insulin NPH Human (HUMULIN N,NOVOLIN N) 100 UNIT/ML injection Inject 30 units [place] twice daily. MDD=60 units    . insulin regular (NOVOLIN R,HUMULIN R) 100 units/mL injection  Inject 15 Units into the skin 3 (three) times daily before meals.     . latanoprost (XALATAN) 0.005 % ophthalmic solution Place 1 drop into both eyes at bedtime.    Marland Kitchen linaclotide (LINZESS) 145 MCG CAPS capsule Take 1 capsule by mouth daily as needed.     . metolazone (ZAROXOLYN) 2.5 MG tablet Take 1 tablet (2.5 mg total) by mouth once a week. Take Every Wednesday 15 tablet 3  . nitroGLYCERIN (NITROSTAT) 0.4 MG SL tablet Place 0.4 mg under the tongue as needed.    . pantoprazole (PROTONIX) 40 MG tablet Take 40 mg by mouth.    . pravastatin (PRAVACHOL) 40 MG tablet Take 80 mg by mouth.    . pregabalin (LYRICA)  75 MG capsule Take 75 mg by mouth 3 (three) times daily.     . promethazine (PHENERGAN) 25 MG tablet Take 25 mg by mouth every 6 (six) hours as needed for nausea or vomiting.    . torsemide (DEMADEX) 20 MG tablet Take 4 tablets (80 mg total) by mouth 2 (two) times daily. 180 tablet 3   No current facility-administered medications for this encounter.     Allergies  Allergen Reactions  . Naproxen Hives  . Celecoxib     Other reaction(s): Other (See Comments) Stomach pain  . Aspirin Other (See Comments)    CAN NOT TAKE DUE TO ULCERS  . Metoclopramide     Other reaction(s): Other (See Comments) Facial twitching and stuttering  . Metolazone     Other reaction(s): Other (See Comments) Acute renal failure  . Shellfish-Derived Products     Other reaction(s): Angioedema (ALLERGY/intolerance)  . Tramadol     Other reaction(s): Vomiting (intolerance)      Social History   Social History  . Marital status: Married    Spouse name: N/A  . Number of children: N/A  . Years of education: N/A   Occupational History  . Not on file.   Social History Main Topics  . Smoking status: Not on file  . Smokeless tobacco: Not on file  . Alcohol use Not on file  . Drug use: Unknown  . Sexual activity: Not on file   Other Topics Concern  . Not on file   Social History Narrative  . No narrative on file    FamHx:  Denies h/o familial CM or SCD. Multiple family members with CAD.   Vitals:   01/01/17 1510  BP: (!) 136/54  Pulse: (!) 102  SpO2: 98%  Weight: 253 lb (114.8 kg)    PHYSICAL EXAM: General: Fatigued appearing female. NAD. Arrived in wheelchair.  HEENT: normal Neck: supple. JVP to jaw. Carotids 2+ bilat; no bruits. No lymphadenopathy or thryomegaly appreciated. Cor: PMI nondisplaced. Regular rate and rhythm.No rubs, gallops or murmurs. Lungs: Fine crackles in bilateral bases, clear in upper lobes. Normal effort.  Abdomen: Obese, soft, nontender, nondistended. No  hepatosplenomegaly. No bruits or masses. Good bowel sounds. Extremities: no cyanosis, clubbing, rash, 3+ edema to knees.  Neuro: alert & oriented x 3, cranial nerves grossly intact. moves all 4 extremities w/o difficulty. Affect pleasant.  ECG: NSR 94 bpm Low volts. No significant ST-T abnormalities  Personally reviewed    ASSESSMENT & PLAN:  1. Chronic Diastolic Heart Failure- ECHO 08/2016 EF 55%.  - NYHA III - Volume overloaded on exam in the setting of dietary noncompliance. Talked to her at length about limiting sodium in her diet as well as restricting fluids to less than 2L a day.  -  Increase torsemide to 80mg  BID.  - Take metolazone today and tomorrow.  - She will be seen on 01/07/17 for a R/L heart cath.  - She will call the office if she does not lose weight or feels more SOB. Instructed her to call 911 if she develops chest pain.  - Consider Cardiomems - chest circumference 124 cm.  2. Obesity- Body mass index is 43.43 kg/m.  - Encouraged her to stop eating out, limit portion size.  3. OSA - Using CPAP nightly  4. Anemia - CBC today.  5. CAD: s/p RCA stenting in 08/2016 - Continue Plavix and ASA - Intermittent chest pain, instructed her to call 911 if she develops chest pain.  6. PAF- S/P ablation 2016.  - NSR today.  - Not on any anticoagulation. 7. HTN: - Well controlled on current regimen.   Pre cath labs today.  For R/L heart cath on 6/25.   Arbutus Leas 1:46 PM

## 2017-01-07 NOTE — Discharge Instructions (Signed)

## 2017-01-08 ENCOUNTER — Encounter (HOSPITAL_COMMUNITY): Payer: Self-pay | Admitting: Cardiology

## 2017-01-22 DIAGNOSIS — G4733 Obstructive sleep apnea (adult) (pediatric): Secondary | ICD-10-CM | POA: Diagnosis not present

## 2017-01-23 DIAGNOSIS — G4733 Obstructive sleep apnea (adult) (pediatric): Secondary | ICD-10-CM | POA: Diagnosis not present

## 2017-01-23 DIAGNOSIS — J4520 Mild intermittent asthma, uncomplicated: Secondary | ICD-10-CM | POA: Diagnosis not present

## 2017-01-23 DIAGNOSIS — R5383 Other fatigue: Secondary | ICD-10-CM | POA: Diagnosis not present

## 2017-01-29 DIAGNOSIS — E103553 Type 1 diabetes mellitus with stable proliferative diabetic retinopathy, bilateral: Secondary | ICD-10-CM | POA: Diagnosis not present

## 2017-01-29 DIAGNOSIS — H401131 Primary open-angle glaucoma, bilateral, mild stage: Secondary | ICD-10-CM | POA: Diagnosis not present

## 2017-01-31 ENCOUNTER — Ambulatory Visit (HOSPITAL_COMMUNITY)
Admission: RE | Admit: 2017-01-31 | Discharge: 2017-01-31 | Disposition: A | Discharge status: Home / Self Care | Payer: Medicare Other | Source: Ambulatory Visit | Attending: Cardiology | Admitting: Cardiology

## 2017-01-31 ENCOUNTER — Encounter (HOSPITAL_COMMUNITY): Payer: Self-pay

## 2017-01-31 VITALS — BP 128/62 | HR 97 | Wt 243.6 lb

## 2017-01-31 DIAGNOSIS — G4733 Obstructive sleep apnea (adult) (pediatric): Secondary | ICD-10-CM

## 2017-01-31 DIAGNOSIS — I480 Paroxysmal atrial fibrillation: Secondary | ICD-10-CM | POA: Diagnosis not present

## 2017-01-31 DIAGNOSIS — Z6841 Body Mass Index (BMI) 40.0 and over, adult: Secondary | ICD-10-CM | POA: Diagnosis not present

## 2017-01-31 DIAGNOSIS — Z79899 Other long term (current) drug therapy: Secondary | ICD-10-CM | POA: Insufficient documentation

## 2017-01-31 DIAGNOSIS — Z7902 Long term (current) use of antithrombotics/antiplatelets: Secondary | ICD-10-CM | POA: Insufficient documentation

## 2017-01-31 DIAGNOSIS — E669 Obesity, unspecified: Secondary | ICD-10-CM | POA: Insufficient documentation

## 2017-01-31 DIAGNOSIS — E1040 Type 1 diabetes mellitus with diabetic neuropathy, unspecified: Secondary | ICD-10-CM | POA: Diagnosis not present

## 2017-01-31 DIAGNOSIS — D649 Anemia, unspecified: Secondary | ICD-10-CM | POA: Insufficient documentation

## 2017-01-31 DIAGNOSIS — I5032 Chronic diastolic (congestive) heart failure: Secondary | ICD-10-CM | POA: Insufficient documentation

## 2017-01-31 DIAGNOSIS — Z89412 Acquired absence of left great toe: Secondary | ICD-10-CM | POA: Insufficient documentation

## 2017-01-31 DIAGNOSIS — Z888 Allergy status to other drugs, medicaments and biological substances status: Secondary | ICD-10-CM | POA: Diagnosis not present

## 2017-01-31 DIAGNOSIS — I110 Hypertensive heart disease with heart failure: Secondary | ICD-10-CM | POA: Insufficient documentation

## 2017-01-31 DIAGNOSIS — I2510 Atherosclerotic heart disease of native coronary artery without angina pectoris: Secondary | ICD-10-CM | POA: Insufficient documentation

## 2017-01-31 DIAGNOSIS — Z955 Presence of coronary angioplasty implant and graft: Secondary | ICD-10-CM | POA: Diagnosis not present

## 2017-01-31 DIAGNOSIS — Z8249 Family history of ischemic heart disease and other diseases of the circulatory system: Secondary | ICD-10-CM | POA: Insufficient documentation

## 2017-01-31 DIAGNOSIS — E785 Hyperlipidemia, unspecified: Secondary | ICD-10-CM | POA: Diagnosis not present

## 2017-01-31 DIAGNOSIS — Z89422 Acquired absence of other left toe(s): Secondary | ICD-10-CM | POA: Diagnosis not present

## 2017-01-31 DIAGNOSIS — Z794 Long term (current) use of insulin: Secondary | ICD-10-CM | POA: Diagnosis not present

## 2017-01-31 LAB — BRAIN NATRIURETIC PEPTIDE: B Natriuretic Peptide: 23.2 pg/mL (ref 0.0–100.0)

## 2017-01-31 LAB — BASIC METABOLIC PANEL
Anion gap: 9 (ref 5–15)
BUN: 43 mg/dL — ABNORMAL HIGH (ref 6–20)
CO2: 29 mmol/L (ref 22–32)
Calcium: 9 mg/dL (ref 8.9–10.3)
Chloride: 94 mmol/L — ABNORMAL LOW (ref 101–111)
Creatinine, Ser: 1.3 mg/dL — ABNORMAL HIGH (ref 0.44–1.00)
GFR calc Af Amer: 53 mL/min — ABNORMAL LOW (ref >60)
GFR calc non Af Amer: 46 mL/min — ABNORMAL LOW (ref >60)
Glucose, Bld: 398 mg/dL — ABNORMAL HIGH (ref 65–99)
Potassium: 4 mmol/L (ref 3.5–5.1)
Sodium: 132 mmol/L — ABNORMAL LOW (ref 135–145)

## 2017-01-31 MED ORDER — ISOSORBIDE MONONITRATE ER 30 MG PO TB24: 15.0000 mg | ORAL_TABLET | Freq: Every day | ORAL | 6 refills | Status: DC

## 2017-01-31 NOTE — Progress Notes (Signed)
ADVANCED HF CLINIC  NOTE  Referring Physician: Dr Bettina Gavia  Primary Care: Dr Tobie Poet in [place] Primary Cardiologist: Dr Bettina Gavia  Pulmonary: Dr Alcide Clever  HPI: Dana [name] is a 56 year old with a history of obesity, diastolic heart failure, DMI, neuropathy, HTN, hyperlipidemia, CAD (most recent [facility] 08/2016 DES RCA and stent LAD total of 4 stents), anemia, OSA --> started CPAP 12/2016, PAF S/P ablation, PAD S/P L great toe and 4th toe amputation.   Followed closely Dr Bettina Gavia and hs had difficult to manager HF. She has been requiring IV lasix at home.  Last evaluated April 26th and was volume overloaded at that time so IV lasix was recommended for 2 days. Weight at that time 240 pounds.  Says she lost  3-4 pounds but then the weight went back up. She has been taking torsemide 40 mg twice a day. She does not take metolazone because she had AKI and required hospitalization. Says she went to cardiac rehab 3 years ago.   Today she returns for HF follow up. Had RHC/LHC the end of June. Overall feeling ok. Having chest pain  on Saturday that was relieved with nitro x2. Remains SOB with exertion. Denies PND/Orthopnea. Weight at home 245-251 pounds. Having leg pain when she is walking. Started using CPAP about 1 month ago. Using CPAP nightly.  Taking all medications.  Tries to follow low salt diet. She has stopped eating crackers and cheese.    Studies RHC/LHC 01/07/2017  Stents patent RA mean 7 RV 31/6 PA 31/10, mean 20 PCWP mean 7 LV 142/12 AO 134/64 Oxygen saturations: PA 66% AO 99% Cardiac Output (Fick) 7.31  Cardiac Index (Fick) 3.37 Left Main  No significant disease.  Left Anterior Descending  30% ostial stenosis. 30-40% proximal stenosis. Patent mid LAD stent. 40-50% mid LAD stenosis distal to stent.  Left Circumflex  Large OM1. 30% mid LCx stenosis after OM1. Moderate OM2 with 30-40% proximal stenosis. 30% distal LCx stenosis.  Right Coronary Artery  Serial 30-40% stenoses in the proximal  RCA. Long stented segment in the PDA was patent.    Cardiac cath [facility] 08/30/16:  1. Unstable angina. 2. Severe stenosis of the distal RCA extending into the rPDA. 3. Patent stent in the LAD. 4. Moderate macrovascular disease elsewhere. 5. Diffuse microvascular disease seen. 6. Mild pulmonary hypertension. 7. Normal LVEDP. 8. Normal cardiac output. Interventional Summary Successful PCI / Xience Drug Eluting Stent (2.75/23 mm) of the distal Right RA 7 PCW 14 CO/CO 6.3/3  ECHO 08/2016 EF 55% RV was not clearly visualized  CTA 08/2016 --. No PE       Past Medical History:  Diagnosis Date  . CAD (coronary artery disease)    08/2016 DES to RCA, also with history of LAD stenting.   . Diabetes ([place])   . Diastolic CHF ([place])   . Hyperlipidemia   . Hypertension   . OSA (obstructive sleep apnea)     Current Outpatient Prescriptions  Medication Sig Dispense Refill  . albuterol (ACCUNEB) 0.63 MG/3ML nebulizer solution Inhale 1 ampule by nebulization every six (6) hours as needed for wheezing.    Marland Kitchen albuterol (PROVENTIL HFA;VENTOLIN HFA) 108 (90 Base) MCG/ACT inhaler Inhale 2 puffs into the lungs every 6 (six) hours as needed for wheezing or shortness of breath.     . carvedilol (COREG) 3.125 MG tablet Take 3.125 mg by mouth 2 (two) times daily.    . clopidogrel (PLAVIX) 75 MG tablet Take 75 mg by mouth daily.     Marland Kitchen  ergocalciferol (VITAMIN D2) 50000 units capsule Take 50,000 Units by mouth every Thursday.     . eszopiclone (LUNESTA) 2 MG TABS tablet Take 2 mg by mouth at bedtime.     Marland Kitchen glucagon (GLUCAGON EMERGENCY) 1 MG injection Inject 1 mg into the muscle once as needed (for BS).     Marland Kitchen HYDROcodone-acetaminophen (NORCO) 7.5-325 MG tablet Take 1 tablet by mouth 2 (two) times daily.    . insulin NPH Human (HUMULIN N,NOVOLIN N) 100 UNIT/ML injection Inject 30 units SQ twice daily. MDD=60 units    . insulin regular (NOVOLIN R,HUMULIN R) 100 units/mL injection Inject 15 Units into the skin  3 (three) times daily before meals.     . latanoprost (XALATAN) 0.005 % ophthalmic solution Place 1 drop into both eyes at bedtime.    Marland Kitchen linaclotide (LINZESS) 145 MCG CAPS capsule Take 145 mcg by mouth daily as needed (for stomach).     . metolazone (ZAROXOLYN) 2.5 MG tablet Take 1 tablet (2.5 mg total) by mouth once a week. Take Every Wednesday 15 tablet 3  . nitroGLYCERIN (NITROSTAT) 0.4 MG SL tablet Place 0.4 mg under the tongue every 5 (five) minutes as needed for chest pain.     . OXYGEN Inhale 2 L into the lungs at bedtime.    . pantoprazole (PROTONIX) 40 MG tablet Take 40 mg by mouth 2 (two) times daily.     . pravastatin (PRAVACHOL) 40 MG tablet Take 80 mg by mouth at bedtime.     . pregabalin (LYRICA) 75 MG capsule Take 75 mg by mouth 3 (three) times daily.     . promethazine (PHENERGAN) 25 MG tablet Take 25 mg by mouth every 6 (six) hours as needed for nausea or vomiting.    . torsemide (DEMADEX) 20 MG tablet Take 4 tablets (80 mg total) by mouth 2 (two) times daily. 180 tablet 3   No current facility-administered medications for this encounter.     Allergies  Allergen Reactions  . Naproxen Hives  . Celecoxib Other (See Comments)    Other reaction(s): Other (See Comments) Stomach pain  . Aspirin Other (See Comments)    CAN NOT TAKE DUE TO ULCERS  . Metoclopramide Other (See Comments)    Other reaction(s): Other (See Comments) Facial twitching and stuttering  . Metolazone Other (See Comments)    Other reaction(s): Other (See Comments) Acute renal failure  . Shellfish-Derived Products Other (See Comments)    Other reaction(s): Angioedema (ALLERGY/intolerance)  . Tramadol Other (See Comments)    Other reaction(s): Vomiting (intolerance)      Social History   Social History  . Marital status: Married    Spouse name: N/A  . Number of children: N/A  . Years of education: N/A   Occupational History  . Not on file.   Social History Main Topics  . Smoking status:  Never Smoker  . Smokeless tobacco: Not on file  . Alcohol use Not on file  . Drug use: Unknown  . Sexual activity: Not on file   Other Topics Concern  . Not on file   Social History Narrative  . No narrative on file    FamHx:  Denies h/o familial CM or SCD. Multiple family members with CAD.   Vitals:   01/31/17 1417  BP: 128/62  Pulse: 97  SpO2: 98%  Weight: 243 lb 9.6 oz (110.5 kg)   Filed Weights   01/31/17 1417  Weight: 243 lb 9.6 oz (110.5 kg)   PHYSICAL  EXAM: General:  Well appearing. No resp difficulty. Arrived in a wheel chair. Daughter present.  HEENT: normal Neck: supple. no JVD. Carotids 2+ bilat; no bruits. No lymphadenopathy or thryomegaly appreciated. Cor: PMI nondisplaced. Regular rate & rhythm. No rubs, gallops or murmurs. Lungs: clear Abdomen: soft, nontender, nondistended. No hepatosplenomegaly. No bruits or masses. Good bowel sounds. Extremities: no cyanosis, clubbing, rash, R and LLE trace edema.  Neuro: alert & orientedx3, cranial nerves grossly intact. moves all 4 extremities w/o difficulty. Affect pleasant   EKG: Sinus Tach 102 bpm   ASSESSMENT & PLAN:  1. Chronic Diastolic Heart Failure- ECHO 08/2016 EF 55%.  Discussed RHC/LHC at length.  NYHA II-III. Volume status stable. Continue torsemide 80 mg twice a dadaily and metolazone every Wednesday. BMET today.  Reinforced low salt food choices, and limiting fluid intake to < 2 liters per day.   2. Obesity- Body mass index is 41.17 kg/m. Discussed limiting portions.  3. OSA- Continue nightly CPAP.   4. . Anemia- Hgb 9.7 on April 30th  5. CAD- 4 stents. 08/2016  stent RCA. Recent LHC nonobstructive. On plavix, bb, and statin. Plan cath to further evaluate. Add 15 mg imdur daily. Refer to Cardia Rehab at [facility].  6. PAF- S/P ablation 2016. Regular pulse.  7. HTN: Stable.   Follow up 2 months.   Greater than 50% of the (total minutes 30) visit spent in counseling/coordination of care  regarding LHD/RHC, heart failure, and cardiac rehab.      Georjean Toya NP-C  2:30 PM

## 2017-01-31 NOTE — Patient Instructions (Addendum)
Start Isosorbide 15 mg (1/2 tab) daily  Labs today  You have been referred to Cardiac Rehab at [facility], they will call you to schedule  Your physician recommends that you schedule a follow-up appointment in: 2 months

## 2017-02-04 ENCOUNTER — Telehealth (HOSPITAL_COMMUNITY): Payer: Self-pay

## 2017-02-04 NOTE — Telephone Encounter (Signed)
Faylene Million Peptide  Order: [id number]  Status:  Final result Visible to patient:  No (Not Released) Dx:  Chronic diastolic heart failure ([place])  Notes recorded by Effie Berkshire, RN on 02/04/2017 at 9:52 AM EDT Patient aware, will forward to PCP Dr. Tobie Poet ------  Notes recorded by Conrad Harrington, NP on 01/31/2017 at 4:29 PM EDT Renal function stable. Please call glucose elevated. Ask her to follow up with PCP.

## 2017-02-18 DIAGNOSIS — M3210 Systemic lupus erythematosus, organ or system involvement unspecified: Secondary | ICD-10-CM | POA: Diagnosis not present

## 2017-02-18 DIAGNOSIS — N189 Chronic kidney disease, unspecified: Secondary | ICD-10-CM | POA: Diagnosis not present

## 2017-02-18 DIAGNOSIS — I10 Essential (primary) hypertension: Secondary | ICD-10-CM | POA: Diagnosis not present

## 2017-02-18 DIAGNOSIS — E559 Vitamin D deficiency, unspecified: Secondary | ICD-10-CM | POA: Diagnosis not present

## 2017-02-18 DIAGNOSIS — I509 Heart failure, unspecified: Secondary | ICD-10-CM | POA: Diagnosis not present

## 2017-02-18 DIAGNOSIS — D649 Anemia, unspecified: Secondary | ICD-10-CM | POA: Diagnosis not present

## 2017-02-18 DIAGNOSIS — R809 Proteinuria, unspecified: Secondary | ICD-10-CM | POA: Diagnosis not present

## 2017-02-18 DIAGNOSIS — N08 Glomerular disorders in diseases classified elsewhere: Secondary | ICD-10-CM | POA: Diagnosis not present

## 2017-02-18 DIAGNOSIS — E669 Obesity, unspecified: Secondary | ICD-10-CM | POA: Diagnosis not present

## 2017-02-18 DIAGNOSIS — E039 Hypothyroidism, unspecified: Secondary | ICD-10-CM | POA: Diagnosis not present

## 2017-02-18 DIAGNOSIS — I519 Heart disease, unspecified: Secondary | ICD-10-CM | POA: Diagnosis not present

## 2017-02-18 DIAGNOSIS — E1169 Type 2 diabetes mellitus with other specified complication: Secondary | ICD-10-CM | POA: Diagnosis not present

## 2017-02-19 DIAGNOSIS — L03115 Cellulitis of right lower limb: Secondary | ICD-10-CM | POA: Diagnosis not present

## 2017-02-19 DIAGNOSIS — I509 Heart failure, unspecified: Secondary | ICD-10-CM | POA: Diagnosis not present

## 2017-02-19 DIAGNOSIS — L03113 Cellulitis of right upper limb: Secondary | ICD-10-CM | POA: Diagnosis not present

## 2017-02-19 DIAGNOSIS — M7989 Other specified soft tissue disorders: Secondary | ICD-10-CM | POA: Diagnosis not present

## 2017-02-19 DIAGNOSIS — R918 Other nonspecific abnormal finding of lung field: Secondary | ICD-10-CM | POA: Diagnosis not present

## 2017-02-27 DIAGNOSIS — G43009 Migraine without aura, not intractable, without status migrainosus: Secondary | ICD-10-CM | POA: Diagnosis not present

## 2017-03-05 DIAGNOSIS — I10 Essential (primary) hypertension: Secondary | ICD-10-CM | POA: Diagnosis not present

## 2017-03-05 DIAGNOSIS — E119 Type 2 diabetes mellitus without complications: Secondary | ICD-10-CM | POA: Diagnosis not present

## 2017-03-05 DIAGNOSIS — Z955 Presence of coronary angioplasty implant and graft: Secondary | ICD-10-CM | POA: Diagnosis not present

## 2017-03-05 DIAGNOSIS — E785 Hyperlipidemia, unspecified: Secondary | ICD-10-CM | POA: Diagnosis not present

## 2017-03-05 DIAGNOSIS — I2510 Atherosclerotic heart disease of native coronary artery without angina pectoris: Secondary | ICD-10-CM | POA: Diagnosis not present

## 2017-03-06 DIAGNOSIS — E119 Type 2 diabetes mellitus without complications: Secondary | ICD-10-CM | POA: Diagnosis not present

## 2017-03-06 DIAGNOSIS — I2510 Atherosclerotic heart disease of native coronary artery without angina pectoris: Secondary | ICD-10-CM | POA: Diagnosis not present

## 2017-03-06 DIAGNOSIS — I10 Essential (primary) hypertension: Secondary | ICD-10-CM | POA: Diagnosis not present

## 2017-03-06 DIAGNOSIS — E785 Hyperlipidemia, unspecified: Secondary | ICD-10-CM | POA: Diagnosis not present

## 2017-03-06 DIAGNOSIS — Z955 Presence of coronary angioplasty implant and graft: Secondary | ICD-10-CM | POA: Diagnosis not present

## 2017-03-11 DIAGNOSIS — Z955 Presence of coronary angioplasty implant and graft: Secondary | ICD-10-CM | POA: Diagnosis not present

## 2017-03-11 DIAGNOSIS — I2510 Atherosclerotic heart disease of native coronary artery without angina pectoris: Secondary | ICD-10-CM | POA: Diagnosis not present

## 2017-03-11 DIAGNOSIS — E785 Hyperlipidemia, unspecified: Secondary | ICD-10-CM | POA: Diagnosis not present

## 2017-03-11 DIAGNOSIS — I10 Essential (primary) hypertension: Secondary | ICD-10-CM | POA: Diagnosis not present

## 2017-03-11 DIAGNOSIS — E119 Type 2 diabetes mellitus without complications: Secondary | ICD-10-CM | POA: Diagnosis not present

## 2017-03-13 DIAGNOSIS — Z955 Presence of coronary angioplasty implant and graft: Secondary | ICD-10-CM | POA: Diagnosis not present

## 2017-03-13 DIAGNOSIS — E785 Hyperlipidemia, unspecified: Secondary | ICD-10-CM | POA: Diagnosis not present

## 2017-03-13 DIAGNOSIS — I10 Essential (primary) hypertension: Secondary | ICD-10-CM | POA: Diagnosis not present

## 2017-03-13 DIAGNOSIS — I2510 Atherosclerotic heart disease of native coronary artery without angina pectoris: Secondary | ICD-10-CM | POA: Diagnosis not present

## 2017-03-13 DIAGNOSIS — E119 Type 2 diabetes mellitus without complications: Secondary | ICD-10-CM | POA: Diagnosis not present

## 2017-03-15 DIAGNOSIS — E1065 Type 1 diabetes mellitus with hyperglycemia: Secondary | ICD-10-CM | POA: Diagnosis not present

## 2017-03-15 DIAGNOSIS — I10 Essential (primary) hypertension: Secondary | ICD-10-CM | POA: Diagnosis not present

## 2017-03-15 DIAGNOSIS — E1042 Type 1 diabetes mellitus with diabetic polyneuropathy: Secondary | ICD-10-CM | POA: Diagnosis not present

## 2017-03-15 DIAGNOSIS — E782 Mixed hyperlipidemia: Secondary | ICD-10-CM | POA: Diagnosis not present

## 2017-03-20 DIAGNOSIS — E038 Other specified hypothyroidism: Secondary | ICD-10-CM | POA: Diagnosis not present

## 2017-03-20 DIAGNOSIS — I10 Essential (primary) hypertension: Secondary | ICD-10-CM | POA: Diagnosis not present

## 2017-03-20 DIAGNOSIS — I119 Hypertensive heart disease without heart failure: Secondary | ICD-10-CM | POA: Diagnosis not present

## 2017-03-20 DIAGNOSIS — I2510 Atherosclerotic heart disease of native coronary artery without angina pectoris: Secondary | ICD-10-CM | POA: Diagnosis not present

## 2017-03-20 DIAGNOSIS — E782 Mixed hyperlipidemia: Secondary | ICD-10-CM | POA: Diagnosis not present

## 2017-03-20 DIAGNOSIS — E119 Type 2 diabetes mellitus without complications: Secondary | ICD-10-CM | POA: Diagnosis not present

## 2017-03-20 DIAGNOSIS — Z23 Encounter for immunization: Secondary | ICD-10-CM | POA: Diagnosis not present

## 2017-03-20 DIAGNOSIS — Z955 Presence of coronary angioplasty implant and graft: Secondary | ICD-10-CM | POA: Diagnosis not present

## 2017-03-20 DIAGNOSIS — E785 Hyperlipidemia, unspecified: Secondary | ICD-10-CM | POA: Diagnosis not present

## 2017-03-20 DIAGNOSIS — E1142 Type 2 diabetes mellitus with diabetic polyneuropathy: Secondary | ICD-10-CM | POA: Diagnosis not present

## 2017-03-22 DIAGNOSIS — I2510 Atherosclerotic heart disease of native coronary artery without angina pectoris: Secondary | ICD-10-CM | POA: Diagnosis not present

## 2017-03-22 DIAGNOSIS — Z955 Presence of coronary angioplasty implant and graft: Secondary | ICD-10-CM | POA: Diagnosis not present

## 2017-03-22 DIAGNOSIS — I10 Essential (primary) hypertension: Secondary | ICD-10-CM | POA: Diagnosis not present

## 2017-03-22 DIAGNOSIS — E119 Type 2 diabetes mellitus without complications: Secondary | ICD-10-CM | POA: Diagnosis not present

## 2017-03-22 DIAGNOSIS — E785 Hyperlipidemia, unspecified: Secondary | ICD-10-CM | POA: Diagnosis not present

## 2017-03-25 DIAGNOSIS — E785 Hyperlipidemia, unspecified: Secondary | ICD-10-CM | POA: Diagnosis not present

## 2017-03-25 DIAGNOSIS — Z955 Presence of coronary angioplasty implant and graft: Secondary | ICD-10-CM | POA: Diagnosis not present

## 2017-03-25 DIAGNOSIS — I10 Essential (primary) hypertension: Secondary | ICD-10-CM | POA: Diagnosis not present

## 2017-03-25 DIAGNOSIS — E119 Type 2 diabetes mellitus without complications: Secondary | ICD-10-CM | POA: Diagnosis not present

## 2017-03-25 DIAGNOSIS — I2510 Atherosclerotic heart disease of native coronary artery without angina pectoris: Secondary | ICD-10-CM | POA: Diagnosis not present

## 2017-03-27 DIAGNOSIS — E785 Hyperlipidemia, unspecified: Secondary | ICD-10-CM | POA: Diagnosis not present

## 2017-03-27 DIAGNOSIS — I2510 Atherosclerotic heart disease of native coronary artery without angina pectoris: Secondary | ICD-10-CM | POA: Diagnosis not present

## 2017-03-27 DIAGNOSIS — E119 Type 2 diabetes mellitus without complications: Secondary | ICD-10-CM | POA: Diagnosis not present

## 2017-03-27 DIAGNOSIS — I10 Essential (primary) hypertension: Secondary | ICD-10-CM | POA: Diagnosis not present

## 2017-03-27 DIAGNOSIS — Z955 Presence of coronary angioplasty implant and graft: Secondary | ICD-10-CM | POA: Diagnosis not present

## 2017-04-03 ENCOUNTER — Encounter (HOSPITAL_COMMUNITY): Payer: Medicare Other | Admitting: Internal Medicine

## 2017-04-08 DIAGNOSIS — Z955 Presence of coronary angioplasty implant and graft: Secondary | ICD-10-CM | POA: Diagnosis not present

## 2017-04-08 DIAGNOSIS — E785 Hyperlipidemia, unspecified: Secondary | ICD-10-CM | POA: Diagnosis not present

## 2017-04-08 DIAGNOSIS — I2510 Atherosclerotic heart disease of native coronary artery without angina pectoris: Secondary | ICD-10-CM | POA: Diagnosis not present

## 2017-04-08 DIAGNOSIS — I10 Essential (primary) hypertension: Secondary | ICD-10-CM | POA: Diagnosis not present

## 2017-04-08 DIAGNOSIS — E119 Type 2 diabetes mellitus without complications: Secondary | ICD-10-CM | POA: Diagnosis not present

## 2017-04-12 DIAGNOSIS — Z955 Presence of coronary angioplasty implant and graft: Secondary | ICD-10-CM | POA: Diagnosis not present

## 2017-04-12 DIAGNOSIS — E785 Hyperlipidemia, unspecified: Secondary | ICD-10-CM | POA: Diagnosis not present

## 2017-04-12 DIAGNOSIS — I2510 Atherosclerotic heart disease of native coronary artery without angina pectoris: Secondary | ICD-10-CM | POA: Diagnosis not present

## 2017-04-12 DIAGNOSIS — I10 Essential (primary) hypertension: Secondary | ICD-10-CM | POA: Diagnosis not present

## 2017-04-12 DIAGNOSIS — E119 Type 2 diabetes mellitus without complications: Secondary | ICD-10-CM | POA: Diagnosis not present

## 2017-04-15 DIAGNOSIS — E119 Type 2 diabetes mellitus without complications: Secondary | ICD-10-CM | POA: Diagnosis not present

## 2017-04-15 DIAGNOSIS — Z955 Presence of coronary angioplasty implant and graft: Secondary | ICD-10-CM | POA: Diagnosis not present

## 2017-04-15 DIAGNOSIS — E785 Hyperlipidemia, unspecified: Secondary | ICD-10-CM | POA: Diagnosis not present

## 2017-04-15 DIAGNOSIS — I2510 Atherosclerotic heart disease of native coronary artery without angina pectoris: Secondary | ICD-10-CM | POA: Diagnosis not present

## 2017-04-15 DIAGNOSIS — I10 Essential (primary) hypertension: Secondary | ICD-10-CM | POA: Diagnosis not present

## 2017-04-17 DIAGNOSIS — Z955 Presence of coronary angioplasty implant and graft: Secondary | ICD-10-CM | POA: Diagnosis not present

## 2017-04-17 DIAGNOSIS — E785 Hyperlipidemia, unspecified: Secondary | ICD-10-CM | POA: Diagnosis not present

## 2017-04-17 DIAGNOSIS — E119 Type 2 diabetes mellitus without complications: Secondary | ICD-10-CM | POA: Diagnosis not present

## 2017-04-17 DIAGNOSIS — I10 Essential (primary) hypertension: Secondary | ICD-10-CM | POA: Diagnosis not present

## 2017-04-17 DIAGNOSIS — I2510 Atherosclerotic heart disease of native coronary artery without angina pectoris: Secondary | ICD-10-CM | POA: Diagnosis not present

## 2017-04-19 DIAGNOSIS — Z955 Presence of coronary angioplasty implant and graft: Secondary | ICD-10-CM | POA: Diagnosis not present

## 2017-04-19 DIAGNOSIS — E785 Hyperlipidemia, unspecified: Secondary | ICD-10-CM | POA: Diagnosis not present

## 2017-04-19 DIAGNOSIS — E119 Type 2 diabetes mellitus without complications: Secondary | ICD-10-CM | POA: Diagnosis not present

## 2017-04-19 DIAGNOSIS — I2510 Atherosclerotic heart disease of native coronary artery without angina pectoris: Secondary | ICD-10-CM | POA: Diagnosis not present

## 2017-04-19 DIAGNOSIS — I10 Essential (primary) hypertension: Secondary | ICD-10-CM | POA: Diagnosis not present

## 2017-04-22 DIAGNOSIS — Z955 Presence of coronary angioplasty implant and graft: Secondary | ICD-10-CM | POA: Diagnosis not present

## 2017-04-22 DIAGNOSIS — E119 Type 2 diabetes mellitus without complications: Secondary | ICD-10-CM | POA: Diagnosis not present

## 2017-04-22 DIAGNOSIS — E785 Hyperlipidemia, unspecified: Secondary | ICD-10-CM | POA: Diagnosis not present

## 2017-04-22 DIAGNOSIS — I10 Essential (primary) hypertension: Secondary | ICD-10-CM | POA: Diagnosis not present

## 2017-04-22 DIAGNOSIS — I2510 Atherosclerotic heart disease of native coronary artery without angina pectoris: Secondary | ICD-10-CM | POA: Diagnosis not present

## 2017-04-23 DIAGNOSIS — E559 Vitamin D deficiency, unspecified: Secondary | ICD-10-CM | POA: Diagnosis not present

## 2017-04-23 DIAGNOSIS — G4733 Obstructive sleep apnea (adult) (pediatric): Secondary | ICD-10-CM | POA: Diagnosis not present

## 2017-04-23 DIAGNOSIS — J4520 Mild intermittent asthma, uncomplicated: Secondary | ICD-10-CM | POA: Diagnosis not present

## 2017-04-24 DIAGNOSIS — E785 Hyperlipidemia, unspecified: Secondary | ICD-10-CM | POA: Diagnosis not present

## 2017-04-24 DIAGNOSIS — Z955 Presence of coronary angioplasty implant and graft: Secondary | ICD-10-CM | POA: Diagnosis not present

## 2017-04-24 DIAGNOSIS — I2510 Atherosclerotic heart disease of native coronary artery without angina pectoris: Secondary | ICD-10-CM | POA: Diagnosis not present

## 2017-04-24 DIAGNOSIS — E119 Type 2 diabetes mellitus without complications: Secondary | ICD-10-CM | POA: Diagnosis not present

## 2017-04-24 DIAGNOSIS — I10 Essential (primary) hypertension: Secondary | ICD-10-CM | POA: Diagnosis not present

## 2017-04-26 DIAGNOSIS — M3210 Systemic lupus erythematosus, organ or system involvement unspecified: Secondary | ICD-10-CM | POA: Diagnosis not present

## 2017-04-26 DIAGNOSIS — D649 Anemia, unspecified: Secondary | ICD-10-CM | POA: Diagnosis not present

## 2017-04-26 DIAGNOSIS — R309 Painful micturition, unspecified: Secondary | ICD-10-CM | POA: Diagnosis not present

## 2017-04-26 DIAGNOSIS — E669 Obesity, unspecified: Secondary | ICD-10-CM | POA: Diagnosis not present

## 2017-04-26 DIAGNOSIS — R809 Proteinuria, unspecified: Secondary | ICD-10-CM | POA: Diagnosis not present

## 2017-04-26 DIAGNOSIS — E1169 Type 2 diabetes mellitus with other specified complication: Secondary | ICD-10-CM | POA: Diagnosis not present

## 2017-04-26 DIAGNOSIS — I10 Essential (primary) hypertension: Secondary | ICD-10-CM | POA: Diagnosis not present

## 2017-04-26 DIAGNOSIS — I509 Heart failure, unspecified: Secondary | ICD-10-CM | POA: Diagnosis not present

## 2017-04-26 DIAGNOSIS — N189 Chronic kidney disease, unspecified: Secondary | ICD-10-CM | POA: Diagnosis not present

## 2017-04-26 DIAGNOSIS — D520 Dietary folate deficiency anemia: Secondary | ICD-10-CM | POA: Diagnosis not present

## 2017-05-04 DIAGNOSIS — J209 Acute bronchitis, unspecified: Secondary | ICD-10-CM | POA: Diagnosis not present

## 2017-05-08 DIAGNOSIS — J208 Acute bronchitis due to other specified organisms: Secondary | ICD-10-CM | POA: Diagnosis not present

## 2017-05-16 ENCOUNTER — Encounter (HOSPITAL_COMMUNITY): Payer: Self-pay | Admitting: Internal Medicine

## 2017-05-16 ENCOUNTER — Ambulatory Visit (HOSPITAL_COMMUNITY)
Admission: RE | Admit: 2017-05-16 | Discharge: 2017-05-16 | Disposition: A | Discharge status: Home / Self Care | Payer: Medicare Other | Source: Ambulatory Visit | Attending: Internal Medicine | Admitting: Internal Medicine

## 2017-05-16 VITALS — BP 144/70 | HR 104 | Wt 253.2 lb

## 2017-05-16 DIAGNOSIS — Z91013 Allergy to seafood: Secondary | ICD-10-CM | POA: Insufficient documentation

## 2017-05-16 DIAGNOSIS — Z955 Presence of coronary angioplasty implant and graft: Secondary | ICD-10-CM | POA: Insufficient documentation

## 2017-05-16 DIAGNOSIS — R002 Palpitations: Secondary | ICD-10-CM

## 2017-05-16 DIAGNOSIS — Z886 Allergy status to analgesic agent status: Secondary | ICD-10-CM | POA: Diagnosis not present

## 2017-05-16 DIAGNOSIS — Z7902 Long term (current) use of antithrombotics/antiplatelets: Secondary | ICD-10-CM | POA: Insufficient documentation

## 2017-05-16 DIAGNOSIS — I5032 Chronic diastolic (congestive) heart failure: Secondary | ICD-10-CM

## 2017-05-16 DIAGNOSIS — I10 Essential (primary) hypertension: Secondary | ICD-10-CM | POA: Diagnosis not present

## 2017-05-16 DIAGNOSIS — G4733 Obstructive sleep apnea (adult) (pediatric): Secondary | ICD-10-CM | POA: Insufficient documentation

## 2017-05-16 DIAGNOSIS — I110 Hypertensive heart disease with heart failure: Secondary | ICD-10-CM | POA: Diagnosis not present

## 2017-05-16 DIAGNOSIS — Z79899 Other long term (current) drug therapy: Secondary | ICD-10-CM | POA: Insufficient documentation

## 2017-05-16 DIAGNOSIS — Z89412 Acquired absence of left great toe: Secondary | ICD-10-CM | POA: Diagnosis not present

## 2017-05-16 DIAGNOSIS — E1151 Type 2 diabetes mellitus with diabetic peripheral angiopathy without gangrene: Secondary | ICD-10-CM | POA: Diagnosis not present

## 2017-05-16 DIAGNOSIS — E785 Hyperlipidemia, unspecified: Secondary | ICD-10-CM | POA: Insufficient documentation

## 2017-05-16 DIAGNOSIS — Z89422 Acquired absence of other left toe(s): Secondary | ICD-10-CM | POA: Diagnosis not present

## 2017-05-16 DIAGNOSIS — Z794 Long term (current) use of insulin: Secondary | ICD-10-CM | POA: Insufficient documentation

## 2017-05-16 DIAGNOSIS — D649 Anemia, unspecified: Secondary | ICD-10-CM | POA: Diagnosis not present

## 2017-05-16 DIAGNOSIS — I2510 Atherosclerotic heart disease of native coronary artery without angina pectoris: Secondary | ICD-10-CM

## 2017-05-16 DIAGNOSIS — E669 Obesity, unspecified: Secondary | ICD-10-CM | POA: Diagnosis not present

## 2017-05-16 DIAGNOSIS — Z8249 Family history of ischemic heart disease and other diseases of the circulatory system: Secondary | ICD-10-CM | POA: Insufficient documentation

## 2017-05-16 DIAGNOSIS — I480 Paroxysmal atrial fibrillation: Secondary | ICD-10-CM

## 2017-05-16 DIAGNOSIS — Z6841 Body Mass Index (BMI) 40.0 and over, adult: Secondary | ICD-10-CM | POA: Diagnosis not present

## 2017-05-16 DIAGNOSIS — Z888 Allergy status to other drugs, medicaments and biological substances status: Secondary | ICD-10-CM | POA: Insufficient documentation

## 2017-05-16 HISTORY — PX: US ECHOCARDIOGRAPHY: HXRAD669

## 2017-05-16 MED ORDER — CARVEDILOL 6.25 MG PO TABS: 6.2500 mg | ORAL_TABLET | Freq: Two times a day (BID) | ORAL | 3 refills | Status: DC

## 2017-05-16 MED ORDER — SPIRONOLACTONE 25 MG PO TABS: 12.5000 mg | ORAL_TABLET | Freq: Every day | ORAL | 3 refills | Status: DC

## 2017-05-16 NOTE — Progress Notes (Signed)
ADVANCED HF CLINIC  NOTE  Referring Physician: Dr Bettina Gavia  Primary Care: Dr Tobie Poet in [place] Primary Cardiologist: Dr Bettina Gavia  Pulmonary: Dr Alcide Clever  HPI: [name] is a 56 year old with a history of obesity, diastolic heart failure, DMI, neuropathy, HTN, hyperlipidemia, CAD (most recent [facility] 08/2016 DES RCA and stent LAD total of 4 stents), anemia, OSA --> started CPAP 12/2016, PAF S/P ablation, PAD S/P L great toe and 4th toe amputation.   Followed closely Dr Bettina Gavia and hs had difficult to manager HF. She has been requiring IV lasix at home.  Last evaluated April 26th and was volume overloaded at that time so IV lasix was recommended for 2 days. Weight at that time 240 pounds.  Says she lost  3-4 pounds but then the weight went back up. She has been taking torsemide 40 mg twice a day. She does not take metolazone because she had AKI and required hospitalization. Says she went to cardiac rehab 3 years ago.   Today she returns for HF follow up. Had RHC/LHC the end of June - results below. Says she hasn't been well lately. Has been taking care of her mother with PAD. Also had bronchitis and unable to go to CR. Weight stable at 250-251. Taking torsemide 60 bid. Taking metolazone about once a week. Takes extra as needed. Saw nephrologist in [place] and told to take metolazone 3x/week. No CP, orthopnea or PND. Able to do ADLs around house without too much problem. Struggling with AF 2-3x/week. Has palpitations. Occasional lightheadedness. Lasts just a few seconds. Wearing CPAP every night. Last A1C 6.9.  Labs: 04/26/17 K 4.3 Creatinine 0.87   ECHO 08/2016 EF 55% RV was not clearly visualized  Studies RHC/LHC 01/07/2017  Stents patent RA mean 7 RV 31/6 PA 31/10, mean 20 PCWP mean 7 LV 142/12 AO 134/64 Oxygen saturations: PA 66% AO 99% Cardiac Output (Fick) 7.31  Cardiac Index (Fick) 3.37 Left Main  No significant disease.  Left Anterior Descending  30% ostial stenosis. 30-40% proximal stenosis.  Patent mid LAD stent. 40-50% mid LAD stenosis distal to stent.  Left Circumflex  Large OM1. 30% mid LCx stenosis after OM1. Moderate OM2 with 30-40% proximal stenosis. 30% distal LCx stenosis.  Right Coronary Artery  Serial 30-40% stenoses in the proximal RCA. Long stented segment in the PDA was patent.    Cardiac cath [facility] 08/30/16:  2. Severe stenosis of the distal RCA extending into the rPDA. 3. Patent stent in the LAD. 4. Moderate macrovascular disease elsewhere. 5. Diffuse microvascular disease seen. 6. Mild pulmonary hypertension. 7. Normal LVEDP. 8. Normal cardiac output. Interventional Summary Successful PCI / Xience Drug Eluting Stent (2.75/23 mm) of the distal Right RA 7 PCW 14 CO/CO 6.3/3   CTA 08/2016 --. No PE       Past Medical History:  Diagnosis Date  . CAD (coronary artery disease)    08/2016 DES to RCA, also with history of LAD stenting.   . Diabetes ([place])   . Diastolic CHF ([place])   . Hyperlipidemia   . Hypertension   . OSA (obstructive sleep apnea)     Current Outpatient Prescriptions  Medication Sig Dispense Refill  . albuterol (ACCUNEB) 0.63 MG/3ML nebulizer solution Inhale 1 ampule by nebulization every six (6) hours as needed for wheezing.    Marland Kitchen albuterol (PROVENTIL HFA;VENTOLIN HFA) 108 (90 Base) MCG/ACT inhaler Inhale 2 puffs into the lungs every 6 (six) hours as needed for wheezing or shortness of breath.     Marland Kitchen  carvedilol (COREG) 3.125 MG tablet Take 3.125 mg by mouth 2 (two) times daily.    . clopidogrel (PLAVIX) 75 MG tablet Take 75 mg by mouth daily.     . ergocalciferol (VITAMIN D2) 50000 units capsule Take 50,000 Units by mouth every Thursday.     . eszopiclone (LUNESTA) 2 MG TABS tablet Take 2 mg by mouth at bedtime.     Marland Kitchen glucagon (GLUCAGON EMERGENCY) 1 MG injection Inject 1 mg into the muscle once as needed (for BS).     Marland Kitchen HYDROcodone-acetaminophen (NORCO) 7.5-325 MG tablet Take 1 tablet by mouth 2 (two) times daily.    . insulin NPH  Human (HUMULIN N,NOVOLIN N) 100 UNIT/ML injection Inject 30 units SQ twice daily. MDD=60 units    . insulin regular (NOVOLIN R,HUMULIN R) 100 units/mL injection Inject 15 Units into the skin 3 (three) times daily before meals.     . isosorbide mononitrate (IMDUR) 30 MG 24 hr tablet Take 0.5 tablets (15 mg total) by mouth daily. 15 tablet 6  . latanoprost (XALATAN) 0.005 % ophthalmic solution Place 1 drop into both eyes at bedtime.    Marland Kitchen linaclotide (LINZESS) 145 MCG CAPS capsule Take 145 mcg by mouth daily as needed (for stomach).     . metolazone (ZAROXOLYN) 2.5 MG tablet Take 1 tablet (2.5 mg total) by mouth once a week. Take Every Wednesday 15 tablet 3  . nitroGLYCERIN (NITROSTAT) 0.4 MG SL tablet Place 0.4 mg under the tongue every 5 (five) minutes as needed for chest pain.     . OXYGEN Inhale 2 L into the lungs at bedtime.    . pantoprazole (PROTONIX) 40 MG tablet Take 40 mg by mouth 2 (two) times daily.     . pravastatin (PRAVACHOL) 40 MG tablet Take 80 mg by mouth at bedtime.     . pregabalin (LYRICA) 75 MG capsule Take 75 mg by mouth 3 (three) times daily.     . promethazine (PHENERGAN) 25 MG tablet Take 25 mg by mouth every 6 (six) hours as needed for nausea or vomiting.    . torsemide (DEMADEX) 20 MG tablet Take 60 mg by mouth 2 (two) times daily.     No current facility-administered medications for this encounter.     Allergies  Allergen Reactions  . Naproxen Hives  . Celecoxib Other (See Comments)    Other reaction(s): Other (See Comments) Stomach pain  . Aspirin Other (See Comments)    CAN NOT TAKE DUE TO ULCERS  . Metoclopramide Other (See Comments)    Other reaction(s): Other (See Comments) Facial twitching and stuttering  . Metolazone Other (See Comments)    Other reaction(s): Other (See Comments) Acute renal failure  . Shellfish-Derived Products Other (See Comments)    Other reaction(s): Angioedema (ALLERGY/intolerance)  . Tramadol Other (See Comments)    Other  reaction(s): Vomiting (intolerance)      Social History   Social History  . Marital status: Married    Spouse name: N/A  . Number of children: N/A  . Years of education: N/A   Occupational History  . Not on file.   Social History Main Topics  . Smoking status: Never Smoker  . Smokeless tobacco: Never Used  . Alcohol use Not on file  . Drug use: Unknown  . Sexual activity: Not on file   Other Topics Concern  . Not on file   Social History Narrative  . No narrative on file    FamHx:  Denies h/o familial  CM or SCD. Multiple family members with CAD.   Vitals:   05/16/17 1425  BP: (!) 144/70  Pulse: (!) 104  SpO2: 100%  Weight: 253 lb 4 oz (114.9 kg)   Filed Weights   05/16/17 1425  Weight: 253 lb 4 oz (114.9 kg)   PHYSICAL EXAM: General:  Well appearing. No resp difficulty. + frequent cough HEENT: normal Neck: supple. JVP 6-7. Carotids 2+ bilat; no bruits. No lymphadenopathy or thryomegaly appreciated. Cor: PMI nondisplaced. Regular rate & rhythm. 2/6 TAR Lungs: clear Abdomen: obese soft, nontender, nondistended. No hepatosplenomegaly. No bruits or masses. Good bowel sounds. Extremities: no cyanosis, clubbing, rash, tr edema Neuro: alert & orientedx3, cranial nerves grossly intact. moves all 4 extremities w/o difficulty. Affect pleasant  ASSESSMENT & PLAN:  1. Chronic Diastolic Heart Failure- ECHO 08/2016 EF 55%.  - Improved. NYHA II-early III - Volume status much more stable. Just minimally elevated today. Would void using metolazone 3x/week as recommended recently by Nephrology as she previously had AKI with this. Use as needed - Will add spiro 12.5 daily. Follow BMETs - Reinforced low salt food choices, and limiting fluid intake to < 2 liters per day.   2. Obesity- Body mass index is 42.8 kg/m. Continue diet and exercise efforts  3. OSA - Reports compliance with CPAP  4. CAD - 4 stents. 08/2016  stent RCA.  - Recent LHC nonobstructive.  - On plavix,  bb, and statin.  - Continue [facility] at [facility].  5. PAF - S/P ablation 2016. - In NSR today but having more palpitations. Compliant with CPAP - Place 30-day monitor. If has PAF will need AC. - Increase carvedilol to 6.25 bid 7. HTN:  - Elevated. Add spiro. Increase carvedilol   [name] MD 3:04 PM

## 2017-05-16 NOTE — Patient Instructions (Addendum)
Start Spironolactone 12.5 (1/2 tab) daily  Increase Carvedilol 6.25 mg (1 tab), twice a day  Your physician has recommended that you wear an event monitor. Event monitors are medical devices that record the heart's electrical activity. Doctors most often [name] these monitors to diagnose arrhythmias. Arrhythmias are problems with the speed or rhythm of the heartbeat. The monitor is a small, portable device. You can wear one while you do your normal daily activities. This is usually used to diagnose what is causing palpitations/syncope (passing out).  Your physician has requested that you have an echocardiogram. Echocardiography is a painless test that uses sound waves to create images of your heart. It provides your doctor with information about the size and shape of your heart and how well your heart's chambers and valves are working. This procedure takes approximately one hour. There are no restrictions for this procedure.   Your physician recommends that you schedule a follow-up appointment in: 4 months

## 2017-05-17 ENCOUNTER — Other Ambulatory Visit (HOSPITAL_COMMUNITY): Payer: Self-pay | Admitting: Internal Medicine

## 2017-05-22 DIAGNOSIS — E119 Type 2 diabetes mellitus without complications: Secondary | ICD-10-CM | POA: Diagnosis not present

## 2017-05-22 DIAGNOSIS — I2510 Atherosclerotic heart disease of native coronary artery without angina pectoris: Secondary | ICD-10-CM | POA: Diagnosis not present

## 2017-05-22 DIAGNOSIS — Z955 Presence of coronary angioplasty implant and graft: Secondary | ICD-10-CM | POA: Diagnosis not present

## 2017-05-22 DIAGNOSIS — E785 Hyperlipidemia, unspecified: Secondary | ICD-10-CM | POA: Diagnosis not present

## 2017-05-22 DIAGNOSIS — I10 Essential (primary) hypertension: Secondary | ICD-10-CM | POA: Diagnosis not present

## 2017-05-24 ENCOUNTER — Ambulatory Visit (HOSPITAL_COMMUNITY)
Admission: RE | Admit: 2017-05-24 | Discharge: 2017-05-24 | Disposition: A | Discharge status: Home / Self Care | Payer: Medicare Other | Source: Ambulatory Visit | Attending: Cardiology | Admitting: Cardiology

## 2017-05-24 DIAGNOSIS — I5032 Chronic diastolic (congestive) heart failure: Secondary | ICD-10-CM | POA: Diagnosis not present

## 2017-05-24 LAB — BASIC METABOLIC PANEL
Anion gap: 7 (ref 5–15)
BUN: 20 mg/dL (ref 6–20)
CO2: 28 mmol/L (ref 22–32)
Calcium: 8.9 mg/dL (ref 8.9–10.3)
Chloride: 102 mmol/L (ref 101–111)
Creatinine, Ser: 1.2 mg/dL — ABNORMAL HIGH (ref 0.44–1.00)
GFR calc Af Amer: 58 mL/min — ABNORMAL LOW (ref >60)
GFR calc non Af Amer: 50 mL/min — ABNORMAL LOW (ref >60)
Glucose, Bld: 140 mg/dL — ABNORMAL HIGH (ref 65–99)
Potassium: 4.3 mmol/L (ref 3.5–5.1)
Sodium: 137 mmol/L (ref 135–145)

## 2017-05-27 DIAGNOSIS — E785 Hyperlipidemia, unspecified: Secondary | ICD-10-CM | POA: Diagnosis not present

## 2017-05-27 DIAGNOSIS — I10 Essential (primary) hypertension: Secondary | ICD-10-CM | POA: Diagnosis not present

## 2017-05-27 DIAGNOSIS — E119 Type 2 diabetes mellitus without complications: Secondary | ICD-10-CM | POA: Diagnosis not present

## 2017-05-27 DIAGNOSIS — Z955 Presence of coronary angioplasty implant and graft: Secondary | ICD-10-CM | POA: Diagnosis not present

## 2017-05-27 DIAGNOSIS — I2510 Atherosclerotic heart disease of native coronary artery without angina pectoris: Secondary | ICD-10-CM | POA: Diagnosis not present

## 2017-05-29 DIAGNOSIS — I2510 Atherosclerotic heart disease of native coronary artery without angina pectoris: Secondary | ICD-10-CM | POA: Diagnosis not present

## 2017-05-29 DIAGNOSIS — E785 Hyperlipidemia, unspecified: Secondary | ICD-10-CM | POA: Diagnosis not present

## 2017-05-29 DIAGNOSIS — E119 Type 2 diabetes mellitus without complications: Secondary | ICD-10-CM | POA: Diagnosis not present

## 2017-05-29 DIAGNOSIS — I10 Essential (primary) hypertension: Secondary | ICD-10-CM | POA: Diagnosis not present

## 2017-05-29 DIAGNOSIS — Z955 Presence of coronary angioplasty implant and graft: Secondary | ICD-10-CM | POA: Diagnosis not present

## 2017-05-31 ENCOUNTER — Other Ambulatory Visit (HOSPITAL_COMMUNITY): Payer: Medicare Other

## 2017-06-03 DIAGNOSIS — Z955 Presence of coronary angioplasty implant and graft: Secondary | ICD-10-CM | POA: Diagnosis not present

## 2017-06-03 DIAGNOSIS — E119 Type 2 diabetes mellitus without complications: Secondary | ICD-10-CM | POA: Diagnosis not present

## 2017-06-03 DIAGNOSIS — E785 Hyperlipidemia, unspecified: Secondary | ICD-10-CM | POA: Diagnosis not present

## 2017-06-03 DIAGNOSIS — I10 Essential (primary) hypertension: Secondary | ICD-10-CM | POA: Diagnosis not present

## 2017-06-03 DIAGNOSIS — I2510 Atherosclerotic heart disease of native coronary artery without angina pectoris: Secondary | ICD-10-CM | POA: Diagnosis not present

## 2017-06-04 DIAGNOSIS — I2510 Atherosclerotic heart disease of native coronary artery without angina pectoris: Secondary | ICD-10-CM | POA: Diagnosis not present

## 2017-06-04 DIAGNOSIS — Z955 Presence of coronary angioplasty implant and graft: Secondary | ICD-10-CM | POA: Diagnosis not present

## 2017-06-04 DIAGNOSIS — Z1231 Encounter for screening mammogram for malignant neoplasm of breast: Secondary | ICD-10-CM | POA: Diagnosis not present

## 2017-06-04 DIAGNOSIS — E119 Type 2 diabetes mellitus without complications: Secondary | ICD-10-CM | POA: Diagnosis not present

## 2017-06-04 DIAGNOSIS — I10 Essential (primary) hypertension: Secondary | ICD-10-CM | POA: Diagnosis not present

## 2017-06-04 DIAGNOSIS — E785 Hyperlipidemia, unspecified: Secondary | ICD-10-CM | POA: Diagnosis not present

## 2017-06-05 DIAGNOSIS — E119 Type 2 diabetes mellitus without complications: Secondary | ICD-10-CM | POA: Diagnosis not present

## 2017-06-05 DIAGNOSIS — I2510 Atherosclerotic heart disease of native coronary artery without angina pectoris: Secondary | ICD-10-CM | POA: Diagnosis not present

## 2017-06-05 DIAGNOSIS — I10 Essential (primary) hypertension: Secondary | ICD-10-CM | POA: Diagnosis not present

## 2017-06-05 DIAGNOSIS — Z955 Presence of coronary angioplasty implant and graft: Secondary | ICD-10-CM | POA: Diagnosis not present

## 2017-06-05 DIAGNOSIS — E785 Hyperlipidemia, unspecified: Secondary | ICD-10-CM | POA: Diagnosis not present

## 2017-06-12 DIAGNOSIS — E785 Hyperlipidemia, unspecified: Secondary | ICD-10-CM | POA: Diagnosis not present

## 2017-06-12 DIAGNOSIS — E119 Type 2 diabetes mellitus without complications: Secondary | ICD-10-CM | POA: Diagnosis not present

## 2017-06-12 DIAGNOSIS — I2510 Atherosclerotic heart disease of native coronary artery without angina pectoris: Secondary | ICD-10-CM | POA: Diagnosis not present

## 2017-06-12 DIAGNOSIS — Z955 Presence of coronary angioplasty implant and graft: Secondary | ICD-10-CM | POA: Diagnosis not present

## 2017-06-12 DIAGNOSIS — I10 Essential (primary) hypertension: Secondary | ICD-10-CM | POA: Diagnosis not present

## 2017-06-17 DIAGNOSIS — Z955 Presence of coronary angioplasty implant and graft: Secondary | ICD-10-CM | POA: Diagnosis not present

## 2017-06-17 DIAGNOSIS — E119 Type 2 diabetes mellitus without complications: Secondary | ICD-10-CM | POA: Diagnosis not present

## 2017-06-17 DIAGNOSIS — I10 Essential (primary) hypertension: Secondary | ICD-10-CM | POA: Diagnosis not present

## 2017-06-17 DIAGNOSIS — I2510 Atherosclerotic heart disease of native coronary artery without angina pectoris: Secondary | ICD-10-CM | POA: Diagnosis not present

## 2017-06-17 DIAGNOSIS — E785 Hyperlipidemia, unspecified: Secondary | ICD-10-CM | POA: Diagnosis not present

## 2017-06-18 DIAGNOSIS — H401131 Primary open-angle glaucoma, bilateral, mild stage: Secondary | ICD-10-CM | POA: Diagnosis not present

## 2017-06-19 DIAGNOSIS — E1042 Type 1 diabetes mellitus with diabetic polyneuropathy: Secondary | ICD-10-CM | POA: Diagnosis not present

## 2017-06-19 DIAGNOSIS — E782 Mixed hyperlipidemia: Secondary | ICD-10-CM | POA: Diagnosis not present

## 2017-06-19 DIAGNOSIS — I10 Essential (primary) hypertension: Secondary | ICD-10-CM | POA: Diagnosis not present

## 2017-06-19 DIAGNOSIS — E1065 Type 1 diabetes mellitus with hyperglycemia: Secondary | ICD-10-CM | POA: Diagnosis not present

## 2017-06-20 ENCOUNTER — Ambulatory Visit (HOSPITAL_BASED_OUTPATIENT_CLINIC_OR_DEPARTMENT_OTHER): Payer: Medicare Other

## 2017-06-20 ENCOUNTER — Ambulatory Visit (INDEPENDENT_AMBULATORY_CARE_PROVIDER_SITE_OTHER): Payer: Medicare Other

## 2017-06-20 ENCOUNTER — Other Ambulatory Visit (HOSPITAL_COMMUNITY): Payer: Self-pay | Admitting: Internal Medicine

## 2017-06-20 ENCOUNTER — Ambulatory Visit (HOSPITAL_COMMUNITY)
Admission: RE | Admit: 2017-06-20 | Discharge: 2017-06-20 | Disposition: A | Discharge status: Home / Self Care | Payer: Medicare Other | Source: Ambulatory Visit | Attending: Internal Medicine | Admitting: Internal Medicine

## 2017-06-20 ENCOUNTER — Other Ambulatory Visit: Payer: Self-pay

## 2017-06-20 DIAGNOSIS — I5032 Chronic diastolic (congestive) heart failure: Secondary | ICD-10-CM

## 2017-06-20 DIAGNOSIS — G4733 Obstructive sleep apnea (adult) (pediatric): Secondary | ICD-10-CM | POA: Insufficient documentation

## 2017-06-20 DIAGNOSIS — R002 Palpitations: Secondary | ICD-10-CM

## 2017-06-20 DIAGNOSIS — I509 Heart failure, unspecified: Secondary | ICD-10-CM | POA: Diagnosis present

## 2017-06-20 DIAGNOSIS — I110 Hypertensive heart disease with heart failure: Secondary | ICD-10-CM | POA: Diagnosis not present

## 2017-06-20 DIAGNOSIS — R42 Dizziness and giddiness: Secondary | ICD-10-CM | POA: Diagnosis not present

## 2017-06-20 DIAGNOSIS — E785 Hyperlipidemia, unspecified: Secondary | ICD-10-CM | POA: Diagnosis not present

## 2017-06-20 DIAGNOSIS — Z6841 Body Mass Index (BMI) 40.0 and over, adult: Secondary | ICD-10-CM | POA: Insufficient documentation

## 2017-06-20 DIAGNOSIS — E669 Obesity, unspecified: Secondary | ICD-10-CM | POA: Insufficient documentation

## 2017-06-20 DIAGNOSIS — E119 Type 2 diabetes mellitus without complications: Secondary | ICD-10-CM | POA: Diagnosis not present

## 2017-06-20 LAB — BASIC METABOLIC PANEL
Anion gap: 12 (ref 5–15)
BUN: 26 mg/dL — ABNORMAL HIGH (ref 6–20)
CO2: 23 mmol/L (ref 22–32)
Calcium: 8.4 mg/dL — ABNORMAL LOW (ref 8.9–10.3)
Chloride: 102 mmol/L (ref 101–111)
Creatinine, Ser: 1.05 mg/dL — ABNORMAL HIGH (ref 0.44–1.00)
GFR calc Af Amer: >60 mL/min (ref >60)
GFR calc non Af Amer: 59 mL/min — ABNORMAL LOW (ref >60)
Glucose, Bld: 224 mg/dL — ABNORMAL HIGH (ref 65–99)
Potassium: 3.9 mmol/L (ref 3.5–5.1)
Sodium: 137 mmol/L (ref 135–145)

## 2017-06-20 LAB — ECHOCARDIOGRAM COMPLETE
E decel time: 148 msec
E/e' ratio: 14.11
FS: 41 % (ref 28–44)
IVS/LV PW RATIO, ED: 1.18
LA ID, A-P, ES: 37 mm
LA diam end sys: 37 mm
LA diam index: 1.71 cm/m2
LA vol A4C: 61 ml
LV E/e' medial: 14.11
LV E/e'average: 14.11
LV PW d: 10.6 mm — AB (ref 0.6–1.1)
LV e' LATERAL: 8.01 cm/s
LVOT area: 2.84 cm2
LVOT diameter: 19 mm
Lateral S' vel: 13.4 cm/s
MV Dec: 148
MV Peak grad: 5 mmHg
MV pk A vel: 74 m/s
MV pk E vel: 113 m/s
TDI e' lateral: 8.01
TDI e' medial: 8.55

## 2017-06-20 MED ORDER — PERFLUTREN LIPID MICROSPHERE
1.0000 mL | INTRAVENOUS | Status: AC | PRN
  Administered 2017-06-20: 2 mL via INTRAVENOUS

## 2017-06-21 ENCOUNTER — Other Ambulatory Visit (HOSPITAL_COMMUNITY): Payer: Medicare Other

## 2017-06-21 DIAGNOSIS — Z955 Presence of coronary angioplasty implant and graft: Secondary | ICD-10-CM | POA: Diagnosis not present

## 2017-06-21 DIAGNOSIS — I2510 Atherosclerotic heart disease of native coronary artery without angina pectoris: Secondary | ICD-10-CM | POA: Diagnosis not present

## 2017-06-21 DIAGNOSIS — E785 Hyperlipidemia, unspecified: Secondary | ICD-10-CM | POA: Diagnosis not present

## 2017-06-21 DIAGNOSIS — E119 Type 2 diabetes mellitus without complications: Secondary | ICD-10-CM | POA: Diagnosis not present

## 2017-06-21 DIAGNOSIS — I10 Essential (primary) hypertension: Secondary | ICD-10-CM | POA: Diagnosis not present

## 2017-07-01 DIAGNOSIS — Z955 Presence of coronary angioplasty implant and graft: Secondary | ICD-10-CM | POA: Diagnosis not present

## 2017-07-01 DIAGNOSIS — I10 Essential (primary) hypertension: Secondary | ICD-10-CM | POA: Diagnosis not present

## 2017-07-01 DIAGNOSIS — E119 Type 2 diabetes mellitus without complications: Secondary | ICD-10-CM | POA: Diagnosis not present

## 2017-07-01 DIAGNOSIS — I2510 Atherosclerotic heart disease of native coronary artery without angina pectoris: Secondary | ICD-10-CM | POA: Diagnosis not present

## 2017-07-01 DIAGNOSIS — E785 Hyperlipidemia, unspecified: Secondary | ICD-10-CM | POA: Diagnosis not present

## 2017-07-03 DIAGNOSIS — Z955 Presence of coronary angioplasty implant and graft: Secondary | ICD-10-CM | POA: Diagnosis not present

## 2017-07-03 DIAGNOSIS — I2510 Atherosclerotic heart disease of native coronary artery without angina pectoris: Secondary | ICD-10-CM | POA: Diagnosis not present

## 2017-07-03 DIAGNOSIS — E785 Hyperlipidemia, unspecified: Secondary | ICD-10-CM | POA: Diagnosis not present

## 2017-07-03 DIAGNOSIS — Z6841 Body Mass Index (BMI) 40.0 and over, adult: Secondary | ICD-10-CM | POA: Diagnosis not present

## 2017-07-03 DIAGNOSIS — E119 Type 2 diabetes mellitus without complications: Secondary | ICD-10-CM | POA: Diagnosis not present

## 2017-07-03 DIAGNOSIS — Z0001 Encounter for general adult medical examination with abnormal findings: Secondary | ICD-10-CM | POA: Diagnosis not present

## 2017-07-03 DIAGNOSIS — N3946 Mixed incontinence: Secondary | ICD-10-CM | POA: Diagnosis not present

## 2017-07-03 DIAGNOSIS — I10 Essential (primary) hypertension: Secondary | ICD-10-CM | POA: Diagnosis not present

## 2017-07-17 DIAGNOSIS — I2510 Atherosclerotic heart disease of native coronary artery without angina pectoris: Secondary | ICD-10-CM | POA: Diagnosis not present

## 2017-07-17 DIAGNOSIS — E785 Hyperlipidemia, unspecified: Secondary | ICD-10-CM | POA: Diagnosis not present

## 2017-07-17 DIAGNOSIS — Z955 Presence of coronary angioplasty implant and graft: Secondary | ICD-10-CM | POA: Diagnosis not present

## 2017-07-17 DIAGNOSIS — E119 Type 2 diabetes mellitus without complications: Secondary | ICD-10-CM | POA: Diagnosis not present

## 2017-07-17 DIAGNOSIS — I10 Essential (primary) hypertension: Secondary | ICD-10-CM | POA: Diagnosis not present

## 2017-07-22 DIAGNOSIS — E119 Type 2 diabetes mellitus without complications: Secondary | ICD-10-CM | POA: Diagnosis not present

## 2017-07-22 DIAGNOSIS — I2510 Atherosclerotic heart disease of native coronary artery without angina pectoris: Secondary | ICD-10-CM | POA: Diagnosis not present

## 2017-07-22 DIAGNOSIS — E785 Hyperlipidemia, unspecified: Secondary | ICD-10-CM | POA: Diagnosis not present

## 2017-07-22 DIAGNOSIS — Z955 Presence of coronary angioplasty implant and graft: Secondary | ICD-10-CM | POA: Diagnosis not present

## 2017-07-22 DIAGNOSIS — I10 Essential (primary) hypertension: Secondary | ICD-10-CM | POA: Diagnosis not present

## 2017-07-23 DIAGNOSIS — E038 Other specified hypothyroidism: Secondary | ICD-10-CM | POA: Diagnosis not present

## 2017-07-23 DIAGNOSIS — Z6841 Body Mass Index (BMI) 40.0 and over, adult: Secondary | ICD-10-CM | POA: Diagnosis not present

## 2017-07-23 DIAGNOSIS — J018 Other acute sinusitis: Secondary | ICD-10-CM | POA: Diagnosis not present

## 2017-07-23 DIAGNOSIS — E782 Mixed hyperlipidemia: Secondary | ICD-10-CM | POA: Diagnosis not present

## 2017-07-23 DIAGNOSIS — R5383 Other fatigue: Secondary | ICD-10-CM | POA: Diagnosis not present

## 2017-07-23 DIAGNOSIS — E1142 Type 2 diabetes mellitus with diabetic polyneuropathy: Secondary | ICD-10-CM | POA: Diagnosis not present

## 2017-07-23 DIAGNOSIS — I119 Hypertensive heart disease without heart failure: Secondary | ICD-10-CM | POA: Diagnosis not present

## 2017-07-26 DIAGNOSIS — J069 Acute upper respiratory infection, unspecified: Secondary | ICD-10-CM | POA: Diagnosis not present

## 2017-07-26 DIAGNOSIS — J040 Acute laryngitis: Secondary | ICD-10-CM | POA: Diagnosis not present

## 2017-08-01 DIAGNOSIS — H40003 Preglaucoma, unspecified, bilateral: Secondary | ICD-10-CM | POA: Diagnosis not present

## 2017-08-05 DIAGNOSIS — I10 Essential (primary) hypertension: Secondary | ICD-10-CM | POA: Diagnosis not present

## 2017-08-05 DIAGNOSIS — Z955 Presence of coronary angioplasty implant and graft: Secondary | ICD-10-CM | POA: Diagnosis not present

## 2017-08-05 DIAGNOSIS — E785 Hyperlipidemia, unspecified: Secondary | ICD-10-CM | POA: Diagnosis not present

## 2017-08-05 DIAGNOSIS — E119 Type 2 diabetes mellitus without complications: Secondary | ICD-10-CM | POA: Diagnosis not present

## 2017-08-05 DIAGNOSIS — I2510 Atherosclerotic heart disease of native coronary artery without angina pectoris: Secondary | ICD-10-CM | POA: Diagnosis not present

## 2017-08-06 DIAGNOSIS — J4520 Mild intermittent asthma, uncomplicated: Secondary | ICD-10-CM | POA: Diagnosis not present

## 2017-08-06 DIAGNOSIS — G4733 Obstructive sleep apnea (adult) (pediatric): Secondary | ICD-10-CM | POA: Diagnosis not present

## 2017-08-06 DIAGNOSIS — R5383 Other fatigue: Secondary | ICD-10-CM | POA: Diagnosis not present

## 2017-08-07 DIAGNOSIS — I10 Essential (primary) hypertension: Secondary | ICD-10-CM | POA: Diagnosis not present

## 2017-08-07 DIAGNOSIS — E785 Hyperlipidemia, unspecified: Secondary | ICD-10-CM | POA: Diagnosis not present

## 2017-08-07 DIAGNOSIS — I2510 Atherosclerotic heart disease of native coronary artery without angina pectoris: Secondary | ICD-10-CM | POA: Diagnosis not present

## 2017-08-07 DIAGNOSIS — E119 Type 2 diabetes mellitus without complications: Secondary | ICD-10-CM | POA: Diagnosis not present

## 2017-08-07 DIAGNOSIS — Z955 Presence of coronary angioplasty implant and graft: Secondary | ICD-10-CM | POA: Diagnosis not present

## 2017-08-12 DIAGNOSIS — I10 Essential (primary) hypertension: Secondary | ICD-10-CM | POA: Diagnosis not present

## 2017-08-12 DIAGNOSIS — Z955 Presence of coronary angioplasty implant and graft: Secondary | ICD-10-CM | POA: Diagnosis not present

## 2017-08-12 DIAGNOSIS — G4733 Obstructive sleep apnea (adult) (pediatric): Secondary | ICD-10-CM | POA: Diagnosis not present

## 2017-08-12 DIAGNOSIS — E785 Hyperlipidemia, unspecified: Secondary | ICD-10-CM | POA: Diagnosis not present

## 2017-08-12 DIAGNOSIS — I2510 Atherosclerotic heart disease of native coronary artery without angina pectoris: Secondary | ICD-10-CM | POA: Diagnosis not present

## 2017-08-12 DIAGNOSIS — E119 Type 2 diabetes mellitus without complications: Secondary | ICD-10-CM | POA: Diagnosis not present

## 2017-08-14 DIAGNOSIS — I10 Essential (primary) hypertension: Secondary | ICD-10-CM | POA: Diagnosis not present

## 2017-08-14 DIAGNOSIS — Z955 Presence of coronary angioplasty implant and graft: Secondary | ICD-10-CM | POA: Diagnosis not present

## 2017-08-14 DIAGNOSIS — I2510 Atherosclerotic heart disease of native coronary artery without angina pectoris: Secondary | ICD-10-CM | POA: Diagnosis not present

## 2017-08-14 DIAGNOSIS — E785 Hyperlipidemia, unspecified: Secondary | ICD-10-CM | POA: Diagnosis not present

## 2017-08-14 DIAGNOSIS — E119 Type 2 diabetes mellitus without complications: Secondary | ICD-10-CM | POA: Diagnosis not present

## 2017-08-16 DIAGNOSIS — E119 Type 2 diabetes mellitus without complications: Secondary | ICD-10-CM | POA: Diagnosis not present

## 2017-08-16 DIAGNOSIS — E785 Hyperlipidemia, unspecified: Secondary | ICD-10-CM | POA: Diagnosis not present

## 2017-08-16 DIAGNOSIS — I10 Essential (primary) hypertension: Secondary | ICD-10-CM | POA: Diagnosis not present

## 2017-08-16 DIAGNOSIS — Z955 Presence of coronary angioplasty implant and graft: Secondary | ICD-10-CM | POA: Diagnosis not present

## 2017-08-16 DIAGNOSIS — I2510 Atherosclerotic heart disease of native coronary artery without angina pectoris: Secondary | ICD-10-CM | POA: Diagnosis not present

## 2017-08-19 DIAGNOSIS — E119 Type 2 diabetes mellitus without complications: Secondary | ICD-10-CM | POA: Diagnosis not present

## 2017-08-19 DIAGNOSIS — E785 Hyperlipidemia, unspecified: Secondary | ICD-10-CM | POA: Diagnosis not present

## 2017-08-19 DIAGNOSIS — I2510 Atherosclerotic heart disease of native coronary artery without angina pectoris: Secondary | ICD-10-CM | POA: Diagnosis not present

## 2017-08-19 DIAGNOSIS — Z955 Presence of coronary angioplasty implant and graft: Secondary | ICD-10-CM | POA: Diagnosis not present

## 2017-08-19 DIAGNOSIS — I10 Essential (primary) hypertension: Secondary | ICD-10-CM | POA: Diagnosis not present

## 2017-08-21 DIAGNOSIS — Z955 Presence of coronary angioplasty implant and graft: Secondary | ICD-10-CM | POA: Diagnosis not present

## 2017-08-21 DIAGNOSIS — I10 Essential (primary) hypertension: Secondary | ICD-10-CM | POA: Diagnosis not present

## 2017-08-21 DIAGNOSIS — E119 Type 2 diabetes mellitus without complications: Secondary | ICD-10-CM | POA: Diagnosis not present

## 2017-08-21 DIAGNOSIS — E785 Hyperlipidemia, unspecified: Secondary | ICD-10-CM | POA: Diagnosis not present

## 2017-08-21 DIAGNOSIS — I2510 Atherosclerotic heart disease of native coronary artery without angina pectoris: Secondary | ICD-10-CM | POA: Diagnosis not present

## 2017-08-23 DIAGNOSIS — I2510 Atherosclerotic heart disease of native coronary artery without angina pectoris: Secondary | ICD-10-CM | POA: Diagnosis not present

## 2017-08-23 DIAGNOSIS — Z955 Presence of coronary angioplasty implant and graft: Secondary | ICD-10-CM | POA: Diagnosis not present

## 2017-08-23 DIAGNOSIS — I10 Essential (primary) hypertension: Secondary | ICD-10-CM | POA: Diagnosis not present

## 2017-08-23 DIAGNOSIS — E119 Type 2 diabetes mellitus without complications: Secondary | ICD-10-CM | POA: Diagnosis not present

## 2017-08-23 DIAGNOSIS — E785 Hyperlipidemia, unspecified: Secondary | ICD-10-CM | POA: Diagnosis not present

## 2017-08-26 DIAGNOSIS — E119 Type 2 diabetes mellitus without complications: Secondary | ICD-10-CM | POA: Diagnosis not present

## 2017-08-26 DIAGNOSIS — I2510 Atherosclerotic heart disease of native coronary artery without angina pectoris: Secondary | ICD-10-CM | POA: Diagnosis not present

## 2017-08-26 DIAGNOSIS — I10 Essential (primary) hypertension: Secondary | ICD-10-CM | POA: Diagnosis not present

## 2017-08-26 DIAGNOSIS — E785 Hyperlipidemia, unspecified: Secondary | ICD-10-CM | POA: Diagnosis not present

## 2017-08-26 DIAGNOSIS — Z955 Presence of coronary angioplasty implant and graft: Secondary | ICD-10-CM | POA: Diagnosis not present

## 2017-09-05 DIAGNOSIS — J018 Other acute sinusitis: Secondary | ICD-10-CM | POA: Diagnosis not present

## 2017-09-09 DIAGNOSIS — I10 Essential (primary) hypertension: Secondary | ICD-10-CM | POA: Diagnosis not present

## 2017-09-09 DIAGNOSIS — E1169 Type 2 diabetes mellitus with other specified complication: Secondary | ICD-10-CM | POA: Diagnosis not present

## 2017-09-09 DIAGNOSIS — R809 Proteinuria, unspecified: Secondary | ICD-10-CM | POA: Diagnosis not present

## 2017-09-09 DIAGNOSIS — I509 Heart failure, unspecified: Secondary | ICD-10-CM | POA: Diagnosis not present

## 2017-09-09 DIAGNOSIS — D649 Anemia, unspecified: Secondary | ICD-10-CM | POA: Diagnosis not present

## 2017-09-09 DIAGNOSIS — N189 Chronic kidney disease, unspecified: Secondary | ICD-10-CM | POA: Diagnosis not present

## 2017-09-09 DIAGNOSIS — E669 Obesity, unspecified: Secondary | ICD-10-CM | POA: Diagnosis not present

## 2017-09-16 DIAGNOSIS — R600 Localized edema: Secondary | ICD-10-CM | POA: Diagnosis not present

## 2017-09-18 DIAGNOSIS — R600 Localized edema: Secondary | ICD-10-CM | POA: Diagnosis not present

## 2017-09-23 DIAGNOSIS — Z8739 Personal history of other diseases of the musculoskeletal system and connective tissue: Secondary | ICD-10-CM | POA: Diagnosis not present

## 2017-09-23 DIAGNOSIS — I509 Heart failure, unspecified: Secondary | ICD-10-CM | POA: Diagnosis not present

## 2017-09-23 DIAGNOSIS — E10622 Type 1 diabetes mellitus with other skin ulcer: Secondary | ICD-10-CM | POA: Diagnosis not present

## 2017-09-23 DIAGNOSIS — Z9981 Dependence on supplemental oxygen: Secondary | ICD-10-CM | POA: Diagnosis not present

## 2017-09-23 DIAGNOSIS — L97812 Non-pressure chronic ulcer of other part of right lower leg with fat layer exposed: Secondary | ICD-10-CM | POA: Diagnosis not present

## 2017-09-23 DIAGNOSIS — I2510 Atherosclerotic heart disease of native coronary artery without angina pectoris: Secondary | ICD-10-CM | POA: Diagnosis not present

## 2017-09-23 DIAGNOSIS — M1990 Unspecified osteoarthritis, unspecified site: Secondary | ICD-10-CM | POA: Diagnosis not present

## 2017-09-23 DIAGNOSIS — E11622 Type 2 diabetes mellitus with other skin ulcer: Secondary | ICD-10-CM | POA: Diagnosis not present

## 2017-09-23 DIAGNOSIS — G40909 Epilepsy, unspecified, not intractable, without status epilepticus: Secondary | ICD-10-CM | POA: Diagnosis not present

## 2017-09-23 DIAGNOSIS — Z955 Presence of coronary angioplasty implant and graft: Secondary | ICD-10-CM | POA: Diagnosis not present

## 2017-09-23 DIAGNOSIS — D649 Anemia, unspecified: Secondary | ICD-10-CM | POA: Diagnosis not present

## 2017-09-23 DIAGNOSIS — Z87891 Personal history of nicotine dependence: Secondary | ICD-10-CM | POA: Diagnosis not present

## 2017-09-23 DIAGNOSIS — G4730 Sleep apnea, unspecified: Secondary | ICD-10-CM | POA: Diagnosis not present

## 2017-09-23 DIAGNOSIS — E1040 Type 1 diabetes mellitus with diabetic neuropathy, unspecified: Secondary | ICD-10-CM | POA: Diagnosis not present

## 2017-09-23 DIAGNOSIS — I110 Hypertensive heart disease with heart failure: Secondary | ICD-10-CM | POA: Diagnosis not present

## 2017-09-23 DIAGNOSIS — Z794 Long term (current) use of insulin: Secondary | ICD-10-CM | POA: Diagnosis not present

## 2017-09-24 DIAGNOSIS — E1121 Type 2 diabetes mellitus with diabetic nephropathy: Secondary | ICD-10-CM | POA: Diagnosis not present

## 2017-09-24 DIAGNOSIS — K047 Periapical abscess without sinus: Secondary | ICD-10-CM | POA: Diagnosis not present

## 2017-10-01 DIAGNOSIS — L97812 Non-pressure chronic ulcer of other part of right lower leg with fat layer exposed: Secondary | ICD-10-CM | POA: Diagnosis not present

## 2017-10-01 DIAGNOSIS — L97921 Non-pressure chronic ulcer of unspecified part of left lower leg limited to breakdown of skin: Secondary | ICD-10-CM | POA: Diagnosis not present

## 2017-10-01 DIAGNOSIS — E11622 Type 2 diabetes mellitus with other skin ulcer: Secondary | ICD-10-CM | POA: Diagnosis not present

## 2017-10-03 DIAGNOSIS — E1121 Type 2 diabetes mellitus with diabetic nephropathy: Secondary | ICD-10-CM | POA: Diagnosis not present

## 2017-10-03 DIAGNOSIS — I5022 Chronic systolic (congestive) heart failure: Secondary | ICD-10-CM | POA: Diagnosis not present

## 2017-10-07 DIAGNOSIS — E11622 Type 2 diabetes mellitus with other skin ulcer: Secondary | ICD-10-CM | POA: Diagnosis not present

## 2017-10-07 DIAGNOSIS — L97812 Non-pressure chronic ulcer of other part of right lower leg with fat layer exposed: Secondary | ICD-10-CM | POA: Diagnosis not present

## 2017-10-14 ENCOUNTER — Other Ambulatory Visit (HOSPITAL_COMMUNITY): Payer: Self-pay | Admitting: Internal Medicine

## 2017-10-14 DIAGNOSIS — E11622 Type 2 diabetes mellitus with other skin ulcer: Secondary | ICD-10-CM | POA: Diagnosis not present

## 2017-10-14 DIAGNOSIS — L97812 Non-pressure chronic ulcer of other part of right lower leg with fat layer exposed: Secondary | ICD-10-CM | POA: Diagnosis not present

## 2017-10-21 DIAGNOSIS — E11622 Type 2 diabetes mellitus with other skin ulcer: Secondary | ICD-10-CM | POA: Diagnosis not present

## 2017-10-21 DIAGNOSIS — L97812 Non-pressure chronic ulcer of other part of right lower leg with fat layer exposed: Secondary | ICD-10-CM | POA: Diagnosis not present

## 2017-10-28 DIAGNOSIS — E11622 Type 2 diabetes mellitus with other skin ulcer: Secondary | ICD-10-CM | POA: Diagnosis not present

## 2017-10-28 DIAGNOSIS — L97812 Non-pressure chronic ulcer of other part of right lower leg with fat layer exposed: Secondary | ICD-10-CM | POA: Diagnosis not present

## 2017-11-04 DIAGNOSIS — Z872 Personal history of diseases of the skin and subcutaneous tissue: Secondary | ICD-10-CM | POA: Diagnosis not present

## 2017-11-04 DIAGNOSIS — E119 Type 2 diabetes mellitus without complications: Secondary | ICD-10-CM | POA: Diagnosis not present

## 2017-11-04 DIAGNOSIS — E11622 Type 2 diabetes mellitus with other skin ulcer: Secondary | ICD-10-CM | POA: Diagnosis not present

## 2017-11-04 DIAGNOSIS — H401131 Primary open-angle glaucoma, bilateral, mild stage: Secondary | ICD-10-CM | POA: Diagnosis not present

## 2017-11-04 DIAGNOSIS — Z09 Encounter for follow-up examination after completed treatment for conditions other than malignant neoplasm: Secondary | ICD-10-CM | POA: Diagnosis not present

## 2017-11-04 DIAGNOSIS — L97819 Non-pressure chronic ulcer of other part of right lower leg with unspecified severity: Secondary | ICD-10-CM | POA: Diagnosis not present

## 2017-11-04 DIAGNOSIS — Z8639 Personal history of other endocrine, nutritional and metabolic disease: Secondary | ICD-10-CM | POA: Diagnosis not present

## 2017-11-06 ENCOUNTER — Inpatient Hospital Stay (HOSPITAL_COMMUNITY)
Admission: EM | Admit: 2017-11-06 | Discharge: 2017-11-11 | DRG: 292 | Disposition: A | Discharge status: Home / Self Care | Payer: Medicare Other | Attending: Internal Medicine | Admitting: Internal Medicine

## 2017-11-06 ENCOUNTER — Emergency Department (HOSPITAL_COMMUNITY): Payer: Medicare Other

## 2017-11-06 ENCOUNTER — Encounter (HOSPITAL_COMMUNITY): Payer: Self-pay

## 2017-11-06 DIAGNOSIS — I5032 Chronic diastolic (congestive) heart failure: Secondary | ICD-10-CM | POA: Diagnosis not present

## 2017-11-06 DIAGNOSIS — N179 Acute kidney failure, unspecified: Secondary | ICD-10-CM | POA: Diagnosis not present

## 2017-11-06 DIAGNOSIS — Z91013 Allergy to seafood: Secondary | ICD-10-CM | POA: Diagnosis not present

## 2017-11-06 DIAGNOSIS — K219 Gastro-esophageal reflux disease without esophagitis: Secondary | ICD-10-CM | POA: Diagnosis present

## 2017-11-06 DIAGNOSIS — I25110 Atherosclerotic heart disease of native coronary artery with unstable angina pectoris: Secondary | ICD-10-CM | POA: Diagnosis present

## 2017-11-06 DIAGNOSIS — E785 Hyperlipidemia, unspecified: Secondary | ICD-10-CM | POA: Diagnosis present

## 2017-11-06 DIAGNOSIS — I5042 Chronic combined systolic (congestive) and diastolic (congestive) heart failure: Secondary | ICD-10-CM | POA: Diagnosis present

## 2017-11-06 DIAGNOSIS — E782 Mixed hyperlipidemia: Secondary | ICD-10-CM | POA: Diagnosis present

## 2017-11-06 DIAGNOSIS — G8929 Other chronic pain: Secondary | ICD-10-CM | POA: Diagnosis present

## 2017-11-06 DIAGNOSIS — I2489 Other forms of acute ischemic heart disease: Secondary | ICD-10-CM

## 2017-11-06 DIAGNOSIS — T502X5A Adverse effect of carbonic-anhydrase inhibitors, benzothiadiazides and other diuretics, initial encounter: Secondary | ICD-10-CM | POA: Diagnosis not present

## 2017-11-06 DIAGNOSIS — I248 Other forms of acute ischemic heart disease: Secondary | ICD-10-CM | POA: Diagnosis not present

## 2017-11-06 DIAGNOSIS — Z888 Allergy status to other drugs, medicaments and biological substances status: Secondary | ICD-10-CM | POA: Diagnosis not present

## 2017-11-06 DIAGNOSIS — R079 Chest pain, unspecified: Secondary | ICD-10-CM | POA: Diagnosis not present

## 2017-11-06 DIAGNOSIS — Z794 Long term (current) use of insulin: Secondary | ICD-10-CM

## 2017-11-06 DIAGNOSIS — G4733 Obstructive sleep apnea (adult) (pediatric): Secondary | ICD-10-CM | POA: Diagnosis present

## 2017-11-06 DIAGNOSIS — E1065 Type 1 diabetes mellitus with hyperglycemia: Secondary | ICD-10-CM | POA: Diagnosis not present

## 2017-11-06 DIAGNOSIS — Z955 Presence of coronary angioplasty implant and graft: Secondary | ICD-10-CM

## 2017-11-06 DIAGNOSIS — J449 Chronic obstructive pulmonary disease, unspecified: Secondary | ICD-10-CM | POA: Diagnosis present

## 2017-11-06 DIAGNOSIS — I5031 Acute diastolic (congestive) heart failure: Secondary | ICD-10-CM | POA: Diagnosis not present

## 2017-11-06 DIAGNOSIS — E1042 Type 1 diabetes mellitus with diabetic polyneuropathy: Secondary | ICD-10-CM | POA: Diagnosis not present

## 2017-11-06 DIAGNOSIS — I200 Unstable angina: Secondary | ICD-10-CM

## 2017-11-06 DIAGNOSIS — I110 Hypertensive heart disease with heart failure: Secondary | ICD-10-CM | POA: Diagnosis not present

## 2017-11-06 DIAGNOSIS — E160 Drug-induced hypoglycemia without coma: Secondary | ICD-10-CM

## 2017-11-06 DIAGNOSIS — Z7902 Long term (current) use of antithrombotics/antiplatelets: Secondary | ICD-10-CM | POA: Diagnosis not present

## 2017-11-06 DIAGNOSIS — I160 Hypertensive urgency: Secondary | ICD-10-CM | POA: Diagnosis not present

## 2017-11-06 DIAGNOSIS — N170 Acute kidney failure with tubular necrosis: Secondary | ICD-10-CM | POA: Diagnosis not present

## 2017-11-06 DIAGNOSIS — T383X5A Adverse effect of insulin and oral hypoglycemic [antidiabetic] drugs, initial encounter: Secondary | ICD-10-CM | POA: Diagnosis not present

## 2017-11-06 DIAGNOSIS — E10649 Type 1 diabetes mellitus with hypoglycemia without coma: Secondary | ICD-10-CM | POA: Diagnosis present

## 2017-11-06 DIAGNOSIS — I5033 Acute on chronic diastolic (congestive) heart failure: Secondary | ICD-10-CM | POA: Diagnosis present

## 2017-11-06 DIAGNOSIS — D638 Anemia in other chronic diseases classified elsewhere: Secondary | ICD-10-CM | POA: Diagnosis present

## 2017-11-06 DIAGNOSIS — Z886 Allergy status to analgesic agent status: Secondary | ICD-10-CM | POA: Diagnosis not present

## 2017-11-06 DIAGNOSIS — Z6841 Body Mass Index (BMI) 40.0 and over, adult: Secondary | ICD-10-CM

## 2017-11-06 DIAGNOSIS — IMO0002 Reserved for concepts with insufficient information to code with codable children: Secondary | ICD-10-CM | POA: Diagnosis present

## 2017-11-06 DIAGNOSIS — D509 Iron deficiency anemia, unspecified: Secondary | ICD-10-CM | POA: Diagnosis present

## 2017-11-06 DIAGNOSIS — J811 Chronic pulmonary edema: Secondary | ICD-10-CM | POA: Diagnosis not present

## 2017-11-06 DIAGNOSIS — I480 Paroxysmal atrial fibrillation: Secondary | ICD-10-CM | POA: Diagnosis present

## 2017-11-06 DIAGNOSIS — I5030 Unspecified diastolic (congestive) heart failure: Secondary | ICD-10-CM | POA: Diagnosis present

## 2017-11-06 DIAGNOSIS — I10 Essential (primary) hypertension: Secondary | ICD-10-CM | POA: Diagnosis not present

## 2017-11-06 HISTORY — DX: Angina pectoris, unspecified: I20.9

## 2017-11-06 LAB — CBC
HCT: 31.1 % — ABNORMAL LOW (ref 36.0–46.0)
Hemoglobin: 9.6 g/dL — ABNORMAL LOW (ref 12.0–15.0)
MCH: 25 pg — ABNORMAL LOW (ref 26.0–34.0)
MCHC: 30.9 g/dL (ref 30.0–36.0)
MCV: 81 fL (ref 78.0–100.0)
Platelets: 330 10*3/uL (ref 150–400)
RBC: 3.84 MIL/uL — ABNORMAL LOW (ref 3.87–5.11)
RDW: 15.3 % (ref 11.5–15.5)
WBC: 11.6 10*3/uL — ABNORMAL HIGH (ref 4.0–10.5)

## 2017-11-06 LAB — BASIC METABOLIC PANEL
Anion gap: 6 (ref 5–15)
BUN: 20 mg/dL (ref 6–20)
CO2: 26 mmol/L (ref 22–32)
Calcium: 9.3 mg/dL (ref 8.9–10.3)
Chloride: 108 mmol/L (ref 101–111)
Creatinine, Ser: 1 mg/dL (ref 0.44–1.00)
GFR calc Af Amer: >60 mL/min (ref >60)
GFR calc non Af Amer: >60 mL/min (ref >60)
Glucose, Bld: 54 mg/dL — ABNORMAL LOW (ref 65–99)
Potassium: 4 mmol/L (ref 3.5–5.1)
Sodium: 140 mmol/L (ref 135–145)

## 2017-11-06 LAB — HEPATIC FUNCTION PANEL
ALT: 28 U/L (ref 14–54)
AST: 30 U/L (ref 15–41)
Albumin: 3.2 g/dL — ABNORMAL LOW (ref 3.5–5.0)
Alkaline Phosphatase: 146 U/L — ABNORMAL HIGH (ref 38–126)
Bilirubin, Direct: <0.1 mg/dL — ABNORMAL LOW (ref 0.1–0.5)
Total Bilirubin: 0.4 mg/dL (ref 0.3–1.2)
Total Protein: 7.6 g/dL (ref 6.5–8.1)

## 2017-11-06 LAB — CBG MONITORING, ED
Glucose-Capillary: 36 mg/dL — CL (ref 65–99)
Glucose-Capillary: 45 mg/dL — ABNORMAL LOW (ref 65–99)
Glucose-Capillary: 128 mg/dL — ABNORMAL HIGH (ref 65–99)

## 2017-11-06 LAB — I-STAT BETA HCG BLOOD, ED (MC, WL, AP ONLY): I-stat hCG, quantitative: <5 m[IU]/mL (ref <5)

## 2017-11-06 LAB — I-STAT TROPONIN, ED: Troponin i, poc: 0.05 ng/mL (ref 0.00–0.08)

## 2017-11-06 LAB — BRAIN NATRIURETIC PEPTIDE: B Natriuretic Peptide: 61.1 pg/mL (ref 0.0–100.0)

## 2017-11-06 LAB — LIPASE, BLOOD: Lipase: 23 U/L (ref 11–51)

## 2017-11-06 MED ORDER — FUROSEMIDE 10 MG/ML IJ SOLN
40.0000 mg | Freq: Once | INTRAMUSCULAR | Status: AC
  Administered 2017-11-06: 40 mg via INTRAVENOUS
  Filled 2017-11-06: qty 4

## 2017-11-06 MED ORDER — GI COCKTAIL ~~LOC~~: 30.0000 mL | Freq: Four times a day (QID) | ORAL | Status: DC | PRN

## 2017-11-06 MED ORDER — ASPIRIN 81 MG PO CHEW
324.0000 mg | CHEWABLE_TABLET | Freq: Once | ORAL | Status: AC
  Administered 2017-11-06: 324 mg via ORAL
  Filled 2017-11-06: qty 4

## 2017-11-06 MED ORDER — ISOSORBIDE MONONITRATE ER 30 MG PO TB24
15.0000 mg | ORAL_TABLET | Freq: Every day | ORAL | Status: DC
  Administered 2017-11-07 – 2017-11-09 (×3): 15 mg via ORAL
  Filled 2017-11-06 (×3): qty 1

## 2017-11-06 MED ORDER — HYDRALAZINE HCL 20 MG/ML IJ SOLN
10.0000 mg | Freq: Once | INTRAMUSCULAR | Status: AC
  Administered 2017-11-06: 10 mg via INTRAVENOUS
  Filled 2017-11-06: qty 1

## 2017-11-06 MED ORDER — LOSARTAN POTASSIUM 25 MG PO TABS
25.0000 mg | ORAL_TABLET | Freq: Every day | ORAL | Status: DC
  Administered 2017-11-07 – 2017-11-08 (×2): 25 mg via ORAL
  Filled 2017-11-06 (×3): qty 1

## 2017-11-06 MED ORDER — DEXTROSE 50 % IV SOLN
25.0000 mL | Freq: Once | INTRAVENOUS | Status: AC
Start: 2017-11-06 — End: 2017-11-06
  Administered 2017-11-06: 25 mL via INTRAVENOUS

## 2017-11-06 MED ORDER — MORPHINE SULFATE (PF) 4 MG/ML IV SOLN
2.0000 mg | INTRAVENOUS | Status: DC | PRN
  Administered 2017-11-07 – 2017-11-09 (×7): 2 mg via INTRAVENOUS
  Filled 2017-11-06 (×7): qty 1

## 2017-11-06 MED ORDER — ZOLPIDEM TARTRATE 5 MG PO TABS
5.0000 mg | ORAL_TABLET | Freq: Every evening | ORAL | Status: DC | PRN
  Administered 2017-11-07 – 2017-11-10 (×4): 5 mg via ORAL
  Filled 2017-11-06 (×4): qty 1

## 2017-11-06 MED ORDER — FERROUS SULFATE 325 (65 FE) MG PO TABS
325.0000 mg | ORAL_TABLET | Freq: Two times a day (BID) | ORAL | Status: DC
  Administered 2017-11-07 – 2017-11-11 (×9): 325 mg via ORAL
  Filled 2017-11-06 (×11): qty 1

## 2017-11-06 MED ORDER — ENOXAPARIN SODIUM 40 MG/0.4ML ~~LOC~~ SOLN
40.0000 mg | Freq: Every day | SUBCUTANEOUS | Status: DC
  Administered 2017-11-07 – 2017-11-11 (×5): 40 mg via SUBCUTANEOUS
  Filled 2017-11-06 (×5): qty 0.4

## 2017-11-06 MED ORDER — CLOPIDOGREL BISULFATE 75 MG PO TABS
75.0000 mg | ORAL_TABLET | Freq: Every day | ORAL | Status: DC
  Administered 2017-11-07 – 2017-11-10 (×5): 75 mg via ORAL
  Filled 2017-11-06 (×5): qty 1

## 2017-11-06 MED ORDER — CARVEDILOL 6.25 MG PO TABS
6.2500 mg | ORAL_TABLET | Freq: Two times a day (BID) | ORAL | Status: DC
  Administered 2017-11-07 – 2017-11-11 (×9): 6.25 mg via ORAL
  Filled 2017-11-06 (×9): qty 1

## 2017-11-06 MED ORDER — LATANOPROST 0.005 % OP SOLN
1.0000 [drp] | Freq: Every day | OPHTHALMIC | Status: DC
  Administered 2017-11-07 – 2017-11-10 (×4): 1 [drp] via OPHTHALMIC
  Filled 2017-11-06 (×2): qty 2.5

## 2017-11-06 MED ORDER — ACETAMINOPHEN 325 MG PO TABS
650.0000 mg | ORAL_TABLET | ORAL | Status: DC | PRN
  Administered 2017-11-07: 650 mg via ORAL
  Filled 2017-11-06: qty 2

## 2017-11-06 MED ORDER — NITROGLYCERIN 0.4 MG SL SUBL: 0.4000 mg | SUBLINGUAL_TABLET | SUBLINGUAL | Status: DC | PRN

## 2017-11-06 MED ORDER — DEXTROSE 50 % IV SOLN
INTRAVENOUS | Status: AC
  Administered 2017-11-06: 25 mL via INTRAVENOUS
  Filled 2017-11-06: qty 50

## 2017-11-06 MED ORDER — ONDANSETRON HCL 4 MG/2ML IJ SOLN
4.0000 mg | Freq: Four times a day (QID) | INTRAMUSCULAR | Status: DC | PRN
  Administered 2017-11-08 – 2017-11-09 (×2): 4 mg via INTRAVENOUS
  Filled 2017-11-06 (×2): qty 2

## 2017-11-06 MED ORDER — PRAVASTATIN SODIUM 40 MG PO TABS
80.0000 mg | ORAL_TABLET | Freq: Every day | ORAL | Status: DC
  Administered 2017-11-07 (×2): 80 mg via ORAL
  Filled 2017-11-06 (×2): qty 2

## 2017-11-06 MED ORDER — HYDRALAZINE HCL 20 MG/ML IJ SOLN: 10.0000 mg | INTRAMUSCULAR | Status: DC | PRN

## 2017-11-06 NOTE — H&P (Addendum)
History and Physical    Dana [name] VZD:[id number] DOB: April 01, 1961 DOA: 11/06/2017  Referring MD/NP/PA: Arlean Hopping, PA-C PCP: [facility]  Patient coming from: home  Chief Complaint: Chest pain  I have personally briefly reviewed patient's old medical records in [place]   HPI: Dana [name] is a 57 y.o. female with medical history significant of HTN, HLD, CAD, diastolic CHF last EF 29-51%, PAF, DM type II; who presents with complaints of intermittent chest pain since yesterday evening.  Patient reports sitting in her chair yesterday night at approximately 2130 when she reports having acute of substernal chest pressure that she describes as "squeezing her heart for every beat".  She also noted having some right arm numbness.  Denies any diaphoresis, palpitations, lightheadedness, or vomiting symptoms.  Patient reports taking 2 nitroglycerin with resolution of pain.  Overnight and into the day she had intermittent pains that self resolved and were less severe.  Associated symptoms include mild nausea, weight increase of 4 pounds in last 24 hours, leg swelling, dyspnea on exertion, headache, blurry vision, generalized malaise, and weakness.  She is followed by Dr. [name] of cardiology in the outpatient setting.  She was scheduled to have a repeat echocardiogram in the coming weeks.  She also notes having a right lower extremity wound for which she was going to wound care center for the last 5 weeks, but was recently released from the care 2 days ago.  The redness surrounding the wound has remained unchanged.  ED Course: On admission into the emergency department patient was noted afebrile, but blood pressure elevated up to 221/73.  Labs revealed WBC 11.6, hemoglobin 9.6, glucose 54, BNP 61.1, and troponin 0.05.  The initial chest x-ray showed cardiomegaly and interstitial edema.  Patient was given 324 mg of aspirin to chew, 25 mL of 50% dextrose for hypoglycemia, 40 mg of Lasix IV  x1 dose for possible congestive heart failure, hydralazine 10 mg IV for blood pressure control.  Dr. Therisa Doyne of cardiology was consulted and recommended medicine admission for serial monitoring overnight.  TRH called to admit.  Review of Systems  Constitutional: Positive for malaise/fatigue. Negative for chills, diaphoresis and fever.  HENT: Negative for ear pain and nosebleeds.   Eyes: Positive for blurred vision. Negative for photophobia.  Respiratory: Positive for shortness of breath.   Cardiovascular: Positive for chest pain and leg swelling.  Gastrointestinal: Positive for nausea. Negative for abdominal pain, blood in stool and vomiting.  Genitourinary: Negative for dysuria and hematuria.  Musculoskeletal: Negative for falls and joint pain.  Skin: Negative for rash.       Positive for right lower extremity wound  Neurological: Positive for dizziness, weakness and headaches. Negative for focal weakness and loss of consciousness.  Psychiatric/Behavioral: Negative for memory loss and substance abuse.    Past Medical History:  Diagnosis Date  . CAD (coronary artery disease)    08/2016 DES to RCA, also with history of LAD stenting.   . Diabetes ([place])   . Diastolic CHF ([place])   . Hyperlipidemia   . Hypertension   . OSA (obstructive sleep apnea)     Past Surgical History:  Procedure Laterality Date  . RIGHT/LEFT HEART CATH AND CORONARY ANGIOGRAPHY N/A 01/07/2017   Procedure: Right/Left Heart Cath and Coronary Angiography;  Surgeon: Larey Dresser, MD;  Location: [facility];  Service: Cardiovascular;  Laterality: N/A;     reports that she has never smoked. She has never used smokeless tobacco.  She reports that she drank alcohol. She reports that she has current or past drug history.  Allergies  Allergen Reactions  . Naproxen Hives and Rash  . Celecoxib Other (See Comments)    Stomach pain  . Aspirin Other (See Comments)    CAN NOT TAKE DUE TO ULCERS  . Glucosamine Hives,  Swelling and Other (See Comments)    Angioedema   . Metoclopramide Other (See Comments)    Facial twitching and stuttering  . Metolazone Other (See Comments)    Acute renal failure  . Shellfish-Derived Products Hives, Swelling and Other (See Comments)    Angioedema   . Tramadol Nausea And Vomiting and Other (See Comments)         No family history on file.  Prior to Admission medications   Medication Sig Start Date End Date Taking? Authorizing Provider  albuterol (ACCUNEB) 0.63 MG/3ML nebulizer solution Inhale 1 ampule by nebulization every six (6) hours as needed for wheezing.   Yes [provider]  albuterol (PROVENTIL HFA;VENTOLIN HFA) 108 (90 Base) MCG/ACT inhaler Inhale 2 puffs into the lungs every 6 (six) hours as needed for wheezing or shortness of breath.    Yes [provider]  carvedilol (COREG) 6.25 MG tablet Take 1 tablet (6.25 mg total) by mouth 2 (two) times daily. 05/16/17 07/15/20 Yes Bensimhon, Shaune Pascal, MD  clopidogrel (PLAVIX) 75 MG tablet Take 75 mg by mouth at bedtime.  08/15/15  Yes [provider]  eszopiclone (LUNESTA) 2 MG TABS tablet Take 2 mg by mouth at bedtime.    Yes [provider]  ferrous sulfate 325 (65 FE) MG tablet Take 325 mg by mouth 2 (two) times daily with a meal.   Yes [provider]  glucagon (GLUCAGON EMERGENCY) 1 MG injection Inject 1 mg into the muscle once as needed (as directed).    Yes [provider]  HYDROcodone-acetaminophen (NORCO) 7.5-325 MG tablet Take 1 tablet by mouth 2 (two) times daily.   Yes [provider]  insulin NPH Human (HUMULIN N,NOVOLIN N) 100 UNIT/ML injection Inject 30 units into the skin two times a day- before breakfast and supper/MDD=60 units 11/21/15  Yes [provider]  insulin regular (NOVOLIN R,HUMULIN R) 100 units/mL injection Inject 7-15 Units into the skin 3 (three) times daily before meals.    Yes [provider]  isosorbide  mononitrate (IMDUR) 30 MG 24 hr tablet Take 0.5 tablets (15 mg total) by mouth daily. 01/31/17 11/06/17 Yes Clegg, Amy D, NP  latanoprost (XALATAN) 0.005 % ophthalmic solution Place 1 drop into both eyes at bedtime.   Yes [provider]  linaclotide (LINZESS) 145 MCG CAPS capsule Take 145 mcg by mouth daily as needed (to accelerate bowel movements).    Yes [provider]  losartan (COZAAR) 25 MG tablet Take 25 mg by mouth daily.   Yes [provider]  metolazone (ZAROXOLYN) 2.5 MG tablet Take 1 tablet (2.5 mg total) by mouth once a week. Take Every Wednesday Patient taking differently: Take 2.5 mg by mouth See admin instructions. Take 2.5 mg by mouth once a week on Wednesdays and may take a 2nd dose of 2.5 mg on Saturdays, depending on degree of swelling 12/26/16  Yes Bensimhon, Shaune Pascal, MD  nitroGLYCERIN (NITROSTAT) 0.4 MG SL tablet Place 0.4 mg under the tongue every 5 (five) minutes as needed for chest pain.    Yes [provider]  Omega-3 Fatty Acids (FISH OIL PO) Take 1 capsule by  mouth daily.   Yes [provider]  OXYGEN Inhale 2 L into the lungs at bedtime.   Yes [provider]  pravastatin (PRAVACHOL) 40 MG tablet Take 80 mg by mouth at bedtime.    Yes [provider]  Probiotic CAPS Take 1 capsule by mouth daily.   Yes [provider]  promethazine (PHENERGAN) 25 MG tablet Take 25 mg by mouth every 6 (six) hours as needed for nausea or vomiting.   Yes [provider]  spironolactone (ALDACTONE) 25 MG tablet Take 0.5 tablets (12.5 mg total) by mouth daily. 05/16/17 11/06/17 Yes Bensimhon, Shaune Pascal, MD  torsemide (DEMADEX) 20 MG tablet Take 60 mg by mouth 2 (two) times daily.   Yes [provider]    Physical Exam:  Constitutional: Obese female in NAD, calm, comfortable Vitals:   11/06/17 1722 11/06/17 1733 11/06/17 1938 11/06/17 2011  BP: (!) 221/73  (!) 193/69 (!) 217/74  Pulse: 96  86 97  Resp:  _0 Temp: 98.4 F (36.9 C)     TempSrc: Oral     SpO2: 96%  100% 100%  Weight:  125.6 kg (277 lb)    Height:  5' 4.5" (1.638 m)     Eyes: PERRL, lids and conjunctivae normal ENMT: Mucous membranes are moist. Posterior pharynx clear of any exudate or lesions.  Neck: normal, supple, no masses, no thyromegaly Respiratory: clear to auscultation bilaterally, no wheezing, no crackles. Normal respiratory effort. No accessory muscle use.  Cardiovascular: Regular rate and rhythm, no murmurs / rubs / gallops.  3+ pitting lower extremity edema. 2+ pedal pulses. No carotid bruits.  Abdomen: no tenderness, no masses palpated. No hepatosplenomegaly. Bowel sounds positive.  Musculoskeletal: no clubbing / cyanosis. No joint deformity upper and lower extremities. Good ROM, no contractures. Normal muscle tone.  Skin: Wound to the anterior shin with surrounding erythema but no increased warmth. Neurologic: CN 2-12 grossly intact. Sensation intact, DTR normal. Strength 5/5 in all 4.  Psychiatric: Normal judgment and insight. Alert and oriented x 3. Normal mood.     Labs on Admission: I have personally reviewed following labs and imaging studies  CBC: Recent Labs  Lab 11/06/17 1738  WBC 11.6*  HGB 9.6*  HCT 31.1*  MCV 81.0  PLT 859   Basic Metabolic Panel: Recent Labs  Lab 11/06/17 1738  NA 140  K 4.0  CL 108  CO2 26  GLUCOSE 54*  BUN 20  CREATININE 1.00  CALCIUM 9.3   GFR: Estimated Creatinine Clearance: 85.1 mL/min (by C-G formula based on SCr of 1 mg/dL). Liver Function Tests: Recent Labs  Lab 11/06/17 1738  AST 30  ALT 28  ALKPHOS 146*  BILITOT 0.4  PROT 7.6  ALBUMIN 3.2*   Recent Labs  Lab 11/06/17 1738  LIPASE 23   No results for input(s): AMMONIA in the last 168 hours. Coagulation Profile: No results for input(s): INR, PROTIME in the last 168 hours. Cardiac Enzymes: No results for input(s): CKTOTAL, CKMB, CKMBINDEX, TROPONINI in the last 168 hours. BNP  (last 3 results) No results for input(s): PROBNP in the last 8760 hours. HbA1C: No results for input(s): HGBA1C in the last 72 hours. CBG: Recent Labs  Lab 11/06/17 1929 11/06/17 1946 11/06/17 2102  GLUCAP 36* 45* 128*   Lipid Profile: No results for input(s): CHOL, HDL, LDLCALC, TRIG, CHOLHDL, LDLDIRECT in the last 72 hours. Thyroid Function Tests: No results for input(s): TSH, T4TOTAL, FREET4, T3FREE, THYROIDAB in the  last 72 hours. Anemia Panel: No results for input(s): VITAMINB12, FOLATE, FERRITIN, TIBC, IRON, RETICCTPCT in the last 72 hours. Urine analysis: No results found for: COLORURINE, APPEARANCEUR, LABSPEC, PHURINE, GLUCOSEU, HGBUR, BILIRUBINUR, KETONESUR, PROTEINUR, UROBILINOGEN, NITRITE, LEUKOCYTESUR Sepsis Labs: No results found for this or any previous visit (from the past 240 hour(s)).   Radiological Exams on Admission: Dg Chest 2 View  Result Date: 11/06/2017 CLINICAL DATA:  Weakness today. Headache and blurred vision. Chest tightness beginning at 5:15 p.m. EXAM: CHEST - 2 VIEW COMPARISON:  Single-view of the chest 11/02/2016 02/19/2017. FINDINGS: There is cardiomegaly and interstitial pulmonary edema. No consolidative process, pneumothorax or effusion. Aortic atherosclerosis is noted. No acute bony abnormality. IMPRESSION: Cardiomegaly and interstitial edema. Electronically Signed   By: Inge Rise M.D.   On: 11/06/2017 18:31    EKG: Independently reviewed.  Sinus at 92 bpm no significant signs of ischemia.  Assessment/Plan Chest pain/possible unstable angina: Acute.  Patient presents with complaints of intermittent chest pain even at rest over the last 24 hours.  Initial EKG showed no significant ischemic changes and troponin negative.  Heart score is at least 4. -  Admit to stepdown - Trend cardiac troponins every 3 hours x3 - Check echocardiogram - Message sent for cardiology to evaluate in a.m.  Hypoglycemia due to insulin, history of uncontrolled type  1 diabetes: Acute.  Initial blood glucose was started to be as low as 36 on admission.  Patient given crackers and orange juice and half amp of D50 with improvement of blood sugars. - Hypoglycemic protocols - Hold all insulin as patient is n.p.o. - CBGs every 4 hours overnight  Diastolic CHF exacerbation: Acute.  Patient reports at least a 4 pound weight gain in the last day.  Patient with 3+ pitting edema bilaterally on physical exam.  BNP noted to be 61.1, but chest x-ray showing cardiomegaly with interstitial edema.  Last EF noted to be around 60-65% on 06/20/2017.  Initially given 40 mg of Lasix IV. - Focus heart failure order set initiated - strict I&Os - daily weight  - Lasix 60 mg IV twice daily - Reassess in a.m. and adjust diuresis as needed  Hypertensive urgency: Acute.  Initial blood pressure was noted to be up to 221/73 on admission. - Continue Coreg, Losartan - Hydralazine IV prn  Leukocytosis: Acute.  WBC elevated at 11.6 on admission.  Unclear if symptoms could be secondary to underlying infection.   - Recheck CBC in a.m. - Monitoring off antibiotics at this time  Right lower extremity wound: Patient reports recently being discharged from wound care clinic.  Denies any change in appearance of wound. - Add-on ESR/CRP  Hypochromic anemia: Hemoglobin 9.6 on admission.  Review of records shows patient's hemoglobin has been 8-9 since 02/2017.  Patient does not complain of bleeding. - Recheck CBC in a.m.   History of paroxysmal atrial fibrillation status post ablation - Follow-up telemetry  Morbid obesity: BMI 46.8  DVT prophylaxis: Lovenox Code Status: Full Family Communication: No family present at bedside Disposition Plan: Likely discharge home once medically stable Consults called: None Admission status: Patient  Norval Morton MD [facility] Pager [phone]   If 7PM-7AM, please contact night-coverage [email or web address] Password [email or web address]  11/06/2017, 10:20  PM

## 2017-11-06 NOTE — ED Triage Notes (Signed)
Pt here c/o chest tighness that started lats night around 10 pm ; pt took 2 nitro w/ relief; today pt had chest tightness around 11am lasting around 5 minutes; pt c/o weakness today, HA, blurred vision; pt took 2 tylenol around 10 am with HA relief; pt states she has been feeling "off" ever since. Pt started having chest tightness around 1715 this afternoon; c/o tingling in fingers; hx of CHF, diabetes, neuropathy; pt states that she fell yesterday (mechanical), hit chin on top of coffee table at home; denies LOC; pt denies SOB, dizziness; pt endorses a 4 LB weight gain since yesterday

## 2017-11-06 NOTE — ED Notes (Signed)
This RN noted patient's Glucose to be 54 on BMP at this time.  Patient pulled from lobby for CBG recheck and will follow up

## 2017-11-06 NOTE — ED Notes (Signed)
Pt brought to triage, CBG 36, A&O x 4, given juice and graham crackers with peanut butter.

## 2017-11-06 NOTE — ED Provider Notes (Addendum)
[facility] EMERGENCY DEPARTMENT Provider Note   CSN: [id number] Arrival date & time: 11/06/17  1716     History   Chief Complaint Chief Complaint  Patient presents with  . Chest Pain    HPI Dana [name] is a 57 y.o. female.  HPI   Dana [name] is a 57 y.o. female, with a history of CAD, DM, CHF with EF 60-65%, hyperlipidemia, and HTN, presenting to the ED with chest discomfort.  States last night she had an episode of chest tightness, central chest, radiating to left chest, moderate, lasting for approximately 15 minutes.  Patient took 2 nitroglycerin and chest pain was relieved. Patient had another episode of chest pain this morning around 10:30 AM and a third episode around 5 PM this evening.  Accompanied by shortness of breath, nausea, fatigue, and dizziness.  She did not end up taking any more nitroglycerin.  She notes a 4 pound weight gain since yesterday as well as increased orthopnea. She states this pain was consistent with the pain she experienced when she had to undergo cardiac cath and stenting. States she did sustain a mechanical fall yesterday.  States she tripped due to her lower extremity neuropathy.  Struck her left chest and chin on the table.  No LOC or vomiting.  BMP returned with blood glucose of 54.  Patient was pulled back into triage and noted to have blood glucose of 36 at 1933.  Patient reportedly alert and oriented x4.  Given juice, graham crackers, and peanut butter.  At 1946 blood glucose was 45. Patient self administered 8 units of fast acting insulin this morning around 9:30AM.  Patient denies syncope, vomiting/diarrhea, fever, abdominal pain, diaphoresis, neck/back pain, neuro deficits, acute peripheral edema, or any other complaints.   Past Medical History:  Diagnosis Date  . CAD (coronary artery disease)    08/2016 DES to RCA, also with history of LAD stenting.   . Diabetes ([place])   . Diastolic CHF ([place])   . Hyperlipidemia     . Hypertension   . OSA (obstructive sleep apnea)     Patient Active Problem List   Diagnosis Date Noted  . Coronary artery disease involving native coronary artery of native heart without angina pectoris 12/25/2016  . Orthostatic dizziness 12/25/2016  . Mixed hyperlipidemia 12/14/2016  . Anemia 03/23/2016  . CAD (coronary artery disease) 03/23/2016  . Chronic pain 03/23/2016  . COPD (chronic obstructive pulmonary disease) ([place]) 03/23/2016  . GERD (gastroesophageal reflux disease) 03/23/2016  . HTN (hypertension) 03/23/2016  . Insulin long-term use ([place]) 10/12/2015  . Uncontrolled type 1 diabetes mellitus with diabetic polyneuropathy ([place]) 10/12/2015  . Atrial fibrillation ([place]) 10/05/2015  . S/P ablation of atrial fibrillation 10/04/2015  . Acute renal failure with tubular necrosis ([place]) 08/30/2015  . SIRS (systemic inflammatory response syndrome) ([place]) 08/30/2015  . Orthostatic hypotension 07/27/2015  . Paroxysmal A-fib ([place]) 07/06/2015  . Chest pain 06/14/2015  . Essential hypertension 04/22/2015  . Diabetic peripheral neuropathy associated with type 2 diabetes mellitus ([place]) 04/02/2014  . Bloating 03/01/2013    Past Surgical History:  Procedure Laterality Date  . RIGHT/LEFT HEART CATH AND CORONARY ANGIOGRAPHY N/A 01/07/2017   Procedure: Right/Left Heart Cath and Coronary Angiography;  Surgeon: Larey Dresser, MD;  Location: [facility];  Service: Cardiovascular;  Laterality: N/A;     OB History   None      Home Medications    Prior to Admission medications   Medication Sig Start  Date End Date Taking? Authorizing Provider  albuterol (ACCUNEB) 0.63 MG/3ML nebulizer solution Inhale 1 ampule by nebulization every six (6) hours as needed for wheezing.   Yes [provider]  albuterol (PROVENTIL HFA;VENTOLIN HFA) 108 (90 Base) MCG/ACT inhaler Inhale 2 puffs into the lungs every 6 (six) hours as needed for wheezing or shortness of breath.    Yes [provider]  carvedilol (COREG) 6.25 MG tablet Take 1 tablet (6.25 mg total) by mouth 2 (two) times daily. 05/16/17 07/15/20 Yes Bensimhon, Shaune Pascal, MD  clopidogrel (PLAVIX) 75 MG tablet Take 75 mg by mouth at bedtime.  08/15/15  Yes [provider]  eszopiclone (LUNESTA) 2 MG TABS tablet Take 2 mg by mouth at bedtime.    Yes [provider]  ferrous sulfate 325 (65 FE) MG tablet Take 325 mg by mouth 2 (two) times daily with a meal.   Yes [provider]  glucagon (GLUCAGON EMERGENCY) 1 MG injection Inject 1 mg into the muscle once as needed (as directed).    Yes [provider]  HYDROcodone-acetaminophen (NORCO) 7.5-325 MG tablet Take 1 tablet by mouth 2 (two) times daily.   Yes [provider]  insulin NPH Human (HUMULIN N,NOVOLIN N) 100 UNIT/ML injection Inject 30 units into the skin two times a day- before breakfast and supper/MDD=60 units 11/21/15  Yes [provider]  insulin regular (NOVOLIN R,HUMULIN R) 100 units/mL injection Inject 7-15 Units into the skin 3 (three) times daily before meals.    Yes [provider]  isosorbide mononitrate (IMDUR) 30 MG 24 hr tablet Take 0.5 tablets (15 mg total) by mouth daily. 01/31/17 11/06/17 Yes Clegg, Amy D, NP  latanoprost (XALATAN) 0.005 % ophthalmic solution Place 1 drop into both eyes at bedtime.   Yes [provider]  linaclotide (LINZESS) 145 MCG CAPS capsule Take 145 mcg by mouth daily as needed (to accelerate bowel movements).    Yes [provider]  losartan (COZAAR) 25 MG tablet Take 25 mg by mouth daily.   Yes [provider]  metolazone (ZAROXOLYN) 2.5 MG tablet Take 1 tablet (2.5 mg total) by mouth once a week. Take Every Wednesday Patient taking differently: Take 2.5 mg by mouth See admin instructions. Take 2.5 mg by mouth once a week on Wednesdays and may take a 2nd dose of 2.5 mg on Saturdays, depending on degree of swelling 12/26/16  Yes Bensimhon,  Shaune Pascal, MD  nitroGLYCERIN (NITROSTAT) 0.4 MG SL tablet Place 0.4 mg under the tongue every 5 (five) minutes as needed for chest pain.    Yes [provider]  Omega-3 Fatty Acids (FISH OIL PO) Take 1 capsule by mouth daily.   Yes [provider]  OXYGEN Inhale 2 L into the lungs at bedtime.   Yes [provider]  pravastatin (PRAVACHOL) 40 MG tablet Take 80 mg by mouth at bedtime.    Yes [provider]  Probiotic CAPS Take 1 capsule by mouth daily.   Yes [provider]  promethazine (PHENERGAN) 25 MG tablet Take 25 mg by mouth every 6 (six) hours as needed for nausea or vomiting.   Yes [provider]  spironolactone (ALDACTONE) 25 MG tablet Take 0.5 tablets (12.5 mg total) by mouth daily. 05/16/17 11/06/17 Yes Bensimhon, Shaune Pascal, MD  torsemide (DEMADEX) 20 MG tablet Take 60 mg by mouth 2 (two) times daily.   Yes [provider]    Family History No family history on file.  Social History Social History   Tobacco Use  . Smoking status: Never Smoker  . Smokeless tobacco: Never Used  Substance Use Topics  . Alcohol use: Not Currently  . Drug use: Not Currently     Allergies   Naproxen; Celecoxib; Aspirin; Glucosamine; Metoclopramide; Metolazone; Shellfish-derived products; and Tramadol   Review of Systems Review of Systems  Constitutional: Positive for fatigue. Negative for chills, diaphoresis and fever.  Respiratory: Positive for shortness of breath. Negative for cough.   Cardiovascular: Positive for chest pain. Negative for leg swelling.  Gastrointestinal: Positive for nausea. Negative for abdominal pain, blood in stool, diarrhea and vomiting.  Musculoskeletal: Negative for back pain and neck pain.  Neurological: Positive for dizziness (none currently) and weakness (generalized). Negative for syncope and numbness.  All other systems reviewed and are negative.    Physical Exam Updated Vital Signs BP (!) 193/69    Pulse 86   Temp 98.4 F (36.9 C) (Oral)   Resp 16   Ht 5' 4.5" (1.638 m)   Wt 125.6 kg (277 lb)   SpO2 100%   BMI 46.81 kg/m   Physical Exam  Constitutional: She is oriented to person, place, and time. She appears well-developed and well-nourished. No distress.  HENT:  Head: Normocephalic and atraumatic.  No noted swelling or tenderness to the patient's chin.  No noted wounds.  Dentition appears to be intact, patient agrees.  No lingual trauma noted.  No evidence of trauma noted elsewhere on the face.  Jaw opening to at least 3 finger widths.  No difficulty or pain with lateral jaw motion.  Eyes: Pupils are equal, round, and reactive to light. Conjunctivae and EOM are normal.  Neck: Normal range of motion. Neck supple.  Cardiovascular: Normal rate, regular rhythm, normal heart sounds and intact distal pulses.  Pulmonary/Chest: Effort normal and breath sounds normal. No respiratory distress.  No increased work of breathing.  Patient speaks in full sentences without difficulty.  Tenderness over the left lateral ribs below the left breast.  No instability, swelling, bruising, erythema, crepitus, or deformity.    Abdominal: Soft. There is no tenderness. There is no guarding.  Musculoskeletal: She exhibits edema.  Bilateral, chronic appearing lower extremity edema.  Normal motor function intact in all extremities and spine. No midline spinal tenderness.   Lymphadenopathy:    She has no cervical adenopathy.  Neurological: She is alert and oriented to person, place, and time.  No sensory deficits. Strength 5/5 in all extremities. No gait disturbance. Coordination intact. Cranial nerves III-XII grossly intact. No facial droop.   Skin: Skin is warm and dry. She is not diaphoretic.  Psychiatric: She has a normal mood and affect. Her behavior is normal.  Nursing note and vitals reviewed.    ED Treatments / Results  Labs (all labs ordered are listed, but only abnormal results are  displayed) Labs Reviewed  BASIC METABOLIC PANEL - Abnormal; Notable for the following components:      Result Value   Glucose, Bld 54 (*)    All other components within normal limits  CBC - Abnormal; Notable for the following components:   WBC 11.6 (*)    RBC 3.84 (*)    Hemoglobin 9.6 (*)    HCT 31.1 (*)    MCH 25.0 (*)    All other components within normal limits  HEPATIC FUNCTION PANEL - Abnormal; Notable for the following components:   Albumin 3.2 (*)    Alkaline Phosphatase 146 (*)    Bilirubin,  Direct <0.1 (*)    All other components within normal limits  CBG MONITORING, ED - Abnormal; Notable for the following components:   Glucose-Capillary 36 (*)    All other components within normal limits  CBG MONITORING, ED - Abnormal; Notable for the following components:   Glucose-Capillary 45 (*)    All other components within normal limits  CBG MONITORING, ED - Abnormal; Notable for the following components:   Glucose-Capillary 128 (*)    All other components within normal limits  BRAIN NATRIURETIC PEPTIDE  LIPASE, BLOOD  I-STAT TROPONIN, ED  I-STAT BETA HCG BLOOD, ED ([facility], [facility], [facility] ONLY)    EKG EKG Interpretation  Date/Time:  Wednesday November 06 2017 17:27:48 EDT Ventricular Rate:  92 PR Interval:  146 QRS Duration: 76 QT Interval:  356 QTC Calculation: 440 R Axis:   75 Text Interpretation:  Normal sinus rhythm Normal ECG No significant change since last tracing Confirmed by Merrily Pew [phone]) on 11/06/2017 11:16:59 PM   Radiology Dg Chest 2 View  Result Date: 11/06/2017 CLINICAL DATA:  Weakness today. Headache and blurred vision. Chest tightness beginning at 5:15 p.m. EXAM: CHEST - 2 VIEW COMPARISON:  Single-view of the chest 11/02/2016 02/19/2017. FINDINGS: There is cardiomegaly and interstitial pulmonary edema. No consolidative process, pneumothorax or effusion. Aortic atherosclerosis is noted. No acute bony abnormality. IMPRESSION: Cardiomegaly and interstitial  edema. Electronically Signed   By: Inge Rise M.D.   On: 11/06/2017 18:31    Procedures Procedures (including critical care time)  Medications Ordered in ED Medications  nitroGLYCERIN (NITROSTAT) SL tablet 0.4 mg (has no administration in time range)  dextrose 50 % solution 25 mL (25 mLs Intravenous Given 11/06/17 2016)  aspirin chewable tablet 324 mg (324 mg Oral Given 11/06/17 2204)  hydrALAZINE (APRESOLINE) injection 10 mg (10 mg Intravenous Given 11/06/17 2225)  furosemide (LASIX) injection 40 mg (40 mg Intravenous Given 11/06/17 2224)     Initial Impression / Assessment and Plan / ED Course  I have reviewed the triage vital signs and the nursing notes.  Pertinent labs & imaging results that were available during my care of the patient were reviewed by me and considered in my medical decision making (see chart for details).  Clinical Course as of Nov 06 2316  Wed Nov 06, 2017  2144 Spoke with Dr. Teena Dunk, cardiology fellow.  Recommends admission via medicine service to stepdown for overnight observation and serial cardiac enzymes.  No cath or stress test. Cardiology will continue to consult on the patient. Blood pressure control with combination of hydralazine and nitroglycerin. IV Lasix for diuresis.   [SJ]  2234 Spoke with Dr. Tamala Julian, hospitalist. Agrees to admit the patient.    [SJ]    Clinical Course User Index [SJ] Remus Hagedorn C, PA-C    Patient presents with intermittent chest pain beginning last night.  Chest pain occurred at rest, gives concern for unstable angina.  No evidence of ischemia on EKG.  Troponin negative. Patient had episode of hypoglycemia.  Remained alert and oriented.  Corrected during ED course. Patient admitted for continued observation and evaluation of her chest pain.  Findings and plan of care discussed with Merrily Pew, MD.   Vitals:   11/06/17 1722 11/06/17 1733 11/06/17 1938 11/06/17 2011  BP: (!) 221/73  (!) 193/69 (!) 217/74  Pulse: 96   86 97  Resp: 14  16 17   Temp: 98.4 F (36.9 C)     TempSrc: Oral     SpO2: 96%  100% 100%  Weight:  125.6 kg (277 lb)    Height:  5' 4.5" (1.638 m)       Final Clinical Impressions(s) / ED Diagnoses   Final diagnoses:  Unstable angina [facility])    ED Discharge Orders    None       Layla Maw 11/06/17 2302    Lorayne Bender, PA-C 11/06/17 2319    Mesner, Corene Cornea, MD 11/06/17 2347

## 2017-11-07 ENCOUNTER — Encounter (HOSPITAL_COMMUNITY): Payer: Self-pay | Admitting: Cardiology

## 2017-11-07 ENCOUNTER — Inpatient Hospital Stay (HOSPITAL_COMMUNITY): Payer: Medicare Other

## 2017-11-07 ENCOUNTER — Other Ambulatory Visit: Payer: Self-pay

## 2017-11-07 DIAGNOSIS — R079 Chest pain, unspecified: Secondary | ICD-10-CM

## 2017-11-07 DIAGNOSIS — I5031 Acute diastolic (congestive) heart failure: Secondary | ICD-10-CM

## 2017-11-07 DIAGNOSIS — E160 Drug-induced hypoglycemia without coma: Secondary | ICD-10-CM | POA: Diagnosis present

## 2017-11-07 DIAGNOSIS — I5042 Chronic combined systolic (congestive) and diastolic (congestive) heart failure: Secondary | ICD-10-CM | POA: Diagnosis present

## 2017-11-07 DIAGNOSIS — I5030 Unspecified diastolic (congestive) heart failure: Secondary | ICD-10-CM | POA: Diagnosis present

## 2017-11-07 DIAGNOSIS — I160 Hypertensive urgency: Secondary | ICD-10-CM | POA: Diagnosis present

## 2017-11-07 DIAGNOSIS — I5032 Chronic diastolic (congestive) heart failure: Secondary | ICD-10-CM | POA: Diagnosis present

## 2017-11-07 DIAGNOSIS — T383X5A Adverse effect of insulin and oral hypoglycemic [antidiabetic] drugs, initial encounter: Secondary | ICD-10-CM

## 2017-11-07 DIAGNOSIS — I10 Essential (primary) hypertension: Secondary | ICD-10-CM

## 2017-11-07 LAB — CBC WITH DIFFERENTIAL/PLATELET
Basophils Absolute: 0 10*3/uL (ref 0.0–0.1)
Basophils Relative: 0 %
Eosinophils Absolute: 0.2 10*3/uL (ref 0.0–0.7)
Eosinophils Relative: 1 %
HCT: 28.8 % — ABNORMAL LOW (ref 36.0–46.0)
Hemoglobin: 9 g/dL — ABNORMAL LOW (ref 12.0–15.0)
Lymphocytes Relative: 17 %
Lymphs Abs: 2.1 10*3/uL (ref 0.7–4.0)
MCH: 25.1 pg — ABNORMAL LOW (ref 26.0–34.0)
MCHC: 31.3 g/dL (ref 30.0–36.0)
MCV: 80.2 fL (ref 78.0–100.0)
Monocytes Absolute: 0.8 10*3/uL (ref 0.1–1.0)
Monocytes Relative: 6 %
Neutro Abs: 9.7 10*3/uL — ABNORMAL HIGH (ref 1.7–7.7)
Neutrophils Relative %: 76 %
Platelets: 311 10*3/uL (ref 150–400)
RBC: 3.59 MIL/uL — ABNORMAL LOW (ref 3.87–5.11)
RDW: 15.4 % (ref 11.5–15.5)
WBC: 12.8 10*3/uL — ABNORMAL HIGH (ref 4.0–10.5)

## 2017-11-07 LAB — CBG MONITORING, ED
Glucose-Capillary: 115 mg/dL — ABNORMAL HIGH (ref 65–99)
Glucose-Capillary: 145 mg/dL — ABNORMAL HIGH (ref 65–99)
Glucose-Capillary: 188 mg/dL — ABNORMAL HIGH (ref 65–99)
Glucose-Capillary: 251 mg/dL — ABNORMAL HIGH (ref 65–99)

## 2017-11-07 LAB — LIPID PANEL
Cholesterol: 203 mg/dL — ABNORMAL HIGH (ref 0–200)
HDL: 44 mg/dL (ref >40)
LDL Cholesterol: 136 mg/dL — ABNORMAL HIGH (ref 0–99)
Total CHOL/HDL Ratio: 4.6 RATIO
Triglycerides: 116 mg/dL (ref <150)
VLDL: 23 mg/dL (ref 0–40)

## 2017-11-07 LAB — BASIC METABOLIC PANEL
Anion gap: 11 (ref 5–15)
BUN: 20 mg/dL (ref 6–20)
CO2: 22 mmol/L (ref 22–32)
Calcium: 9 mg/dL (ref 8.9–10.3)
Chloride: 106 mmol/L (ref 101–111)
Creatinine, Ser: 1 mg/dL (ref 0.44–1.00)
GFR calc Af Amer: >60 mL/min (ref >60)
GFR calc non Af Amer: >60 mL/min (ref >60)
Glucose, Bld: 145 mg/dL — ABNORMAL HIGH (ref 65–99)
Potassium: 3.7 mmol/L (ref 3.5–5.1)
Sodium: 139 mmol/L (ref 135–145)

## 2017-11-07 LAB — ECHOCARDIOGRAM COMPLETE
Height: 64.5 in
Weight: 4432 oz

## 2017-11-07 LAB — SEDIMENTATION RATE: Sed Rate: 85 mm/hr — ABNORMAL HIGH (ref 0–22)

## 2017-11-07 LAB — TROPONIN I
Troponin I: 0.05 ng/mL (ref <0.03)
Troponin I: 0.03 ng/mL (ref <0.03)

## 2017-11-07 LAB — HIV ANTIBODY (ROUTINE TESTING W REFLEX): HIV Screen 4th Generation wRfx: NONREACTIVE

## 2017-11-07 LAB — GLUCOSE, CAPILLARY: Glucose-Capillary: 386 mg/dL — ABNORMAL HIGH (ref 65–99)

## 2017-11-07 MED ORDER — HYDROCODONE-ACETAMINOPHEN 7.5-325 MG PO TABS
1.0000 | ORAL_TABLET | Freq: Two times a day (BID) | ORAL | Status: DC | PRN
  Administered 2017-11-08 – 2017-11-11 (×7): 1 via ORAL
  Filled 2017-11-07 (×8): qty 1

## 2017-11-07 MED ORDER — INSULIN ASPART 100 UNIT/ML ~~LOC~~ SOLN
0.0000 [IU] | Freq: Three times a day (TID) | SUBCUTANEOUS | Status: DC
  Administered 2017-11-08: 11 [IU] via SUBCUTANEOUS
  Administered 2017-11-08: 15 [IU] via SUBCUTANEOUS

## 2017-11-07 MED ORDER — HYDROCODONE-ACETAMINOPHEN 5-325 MG PO TABS
1.0000 | ORAL_TABLET | Freq: Two times a day (BID) | ORAL | Status: DC | PRN
  Administered 2017-11-07: 1 via ORAL
  Filled 2017-11-07: qty 1

## 2017-11-07 MED ORDER — FUROSEMIDE 10 MG/ML IJ SOLN
60.0000 mg | Freq: Two times a day (BID) | INTRAMUSCULAR | Status: DC
  Administered 2017-11-07 – 2017-11-08 (×3): 60 mg via INTRAVENOUS
  Filled 2017-11-07 (×3): qty 6

## 2017-11-07 MED ORDER — PERFLUTREN LIPID MICROSPHERE
1.0000 mL | INTRAVENOUS | Status: AC | PRN
  Administered 2017-11-07: 3 mL via INTRAVENOUS
  Filled 2017-11-07: qty 10

## 2017-11-07 NOTE — Progress Notes (Signed)
  Echocardiogram 2D Echocardiogram has been performed.  Dana [name] 11/07/2017, 2:53 PM

## 2017-11-07 NOTE — Progress Notes (Signed)
Inpatient Diabetes Program Recommendations  AACE/ADA: New Consensus Statement on Inpatient Glycemic Control (2015)  Target Ranges:  Prepandial:   less than 140 mg/dL      Peak postprandial:   less than 180 mg/dL (1-2 hours)      Critically ill patients:  140 - 180 mg/dL   Lab Results  Component Value Date   GLUCAP 145 (H) 11/07/2017   Review of Glycemic Control  Diabetes history: DM 1 Outpatient Diabetes medications: NPH 30 units BID, Novolin R (regular) 7-15 units tid Current orders for Inpatient glycemic control: None  Inpatient Diabetes Program Recommendations:    Patient has DM type 1 and will need a portion of basal insulin. Noted patient admitted with hypoglycemia. Glucose has since increased into normal range.  Please consider a portion of Patient's NPH 20 units BID  Novolog Sensitive Correction 0-9 units tid + Novolog HS scale 0-5 units  If patient is eating consider Novolog 3-4 units meal coverage if eating at least 50% of meals.   Patient sees Dr. Tamala Julian Endocrinology with [facility]. Last visit was 06/19/17 and A1c at that time was 7.2%.  Thanks,  Tama Headings RN, MSN, BC-ADM, [facility] Inpatient Diabetes Coordinator Team Pager [phone] (8a-5p)

## 2017-11-07 NOTE — Progress Notes (Signed)
[facility] PROGRESS NOTE  TERICKA DEVINCENZI CWU:[id number] DOB: July 18, 1960 DOA: 11/06/2017 PCP: Practice, [facility]  Assessment/Plan:  CP/CHF - Checked echocardiogram - CP thought to be due to fluid - diuresis started  Hypoglycemia due to insulin, history of uncontrolled type 1 diabetes: Acute.  Initial blood glucose was started to be as low as 36 on admission.  Patient given crackers and orange juice and half amp of D50 with improvement of blood sugars. - Hypoglycemic protocols - SSI  Hypertensive urgency (resolved)- Continue Coreg, Losartan - Hydralazine IV prn  Right lower extremity wound: Patient reports recently being discharged from wound care clinic.  Denies any change in appearance of wound. - Add-on ESR/CRP  Hypochromic anemia: Hemoglobin 9.6 on admission.  Review of records shows patient's hemoglobin has been 8-9 since 02/2017.  Patient does not complain of bleeding. - Recheck CBC in a.m.   History of paroxysmal atrial fibrillation status post ablation - Follow-up telemetry  Morbid obesity: BMI 46.8  DVT prophylaxis: Lovenox Code Status: Full Family Communication: No family present at bedside Disposition Plan: Likely discharge home once medically stable Consults called: None Admission status: Patient     HPI/Subjective: CP resolved.  Objective: Vitals:   11/07/17 1744 11/07/17 1756  BP:  138/70  Pulse:  83  Resp:  18  Temp: 97.6 F (36.4 C)   SpO2:  97%   No intake or output data in the 24 hours ending 11/07/17 1906 Filed Weights   11/06/17 1733 11/07/17 1744  Weight: 125.6 kg (277 lb) 113.2 kg (249 lb 9 oz)    Exam:  General: NCAT, NAD Cardiovascular: RRR, no MRG Respiratory: CTAB, nl wob Abdomen: NS, BS+, NTTP Musculoskeletal: moving all extr, nl tone     Data Reviewed: Basic Metabolic Panel: Recent Labs  Lab 11/06/17 1738 11/07/17 0445  NA 140 139  K 4.0 3.7  CL 108 106  CO2 26 22  GLUCOSE 54* 145*  BUN 20 20   CREATININE 1.00 1.00  CALCIUM 9.3 9.0   Liver Function Tests: Recent Labs  Lab 11/06/17 1738  AST 30  ALT 28  ALKPHOS 146*  BILITOT 0.4  PROT 7.6  ALBUMIN 3.2*   Recent Labs  Lab 11/06/17 1738  LIPASE 23   No results for input(s): AMMONIA in the last 168 hours. CBC: Recent Labs  Lab 11/06/17 1738 11/07/17 0445  WBC 11.6* 12.8*  NEUTROABS  --  9.7*  HGB 9.6* 9.0*  HCT 31.1* 28.8*  MCV 81.0 80.2  PLT 330 311   Cardiac Enzymes: Recent Labs  Lab 11/07/17 0122 11/07/17 0445  TROPONINI 0.05* 0.03*   BNP (last 3 results) Recent Labs    01/31/17 1503 11/06/17 1734  BNP 23.2 61.1    ProBNP (last 3 results) No results for input(s): PROBNP in the last 8760 hours.  CBG: Recent Labs  Lab 11/06/17 2102 11/07/17 0023 11/07/17 0406 11/07/17 0920 11/07/17 1547  GLUCAP 128* 115* 145* 188* 251*    No results found for this or any previous visit (from the past 240 hour(s)).   Studies: Dg Chest 2 View  Result Date: 11/06/2017 CLINICAL DATA:  Weakness today. Headache and blurred vision. Chest tightness beginning at 5:15 p.m. EXAM: CHEST - 2 VIEW COMPARISON:  Single-view of the chest 11/02/2016 02/19/2017. FINDINGS: There is cardiomegaly and interstitial pulmonary edema. No consolidative process, pneumothorax or effusion. Aortic atherosclerosis is noted. No acute bony abnormality. IMPRESSION: Cardiomegaly and interstitial edema. Electronically Signed   By: Inge Rise M.D.  On: 11/06/2017 18:31    Scheduled Meds: . carvedilol  6.25 mg Oral BID WC  . clopidogrel  75 mg Oral QHS  . enoxaparin (LOVENOX) injection  40 mg Subcutaneous Daily  . ferrous sulfate  325 mg Oral BID WC  . furosemide  60 mg Intravenous BID  . [START ON 11/08/2017] insulin aspart  0-15 Units Subcutaneous TID WC  . isosorbide mononitrate  15 mg Oral Daily  . latanoprost  1 drop Both Eyes QHS  . losartan  25 mg Oral Daily  . pravastatin  80 mg Oral QHS   Continuous  Infusions:  Principal Problem:   Chest pain Active Problems:   Uncontrolled type 1 diabetes mellitus with diabetic polyneuropathy (HCC)   Hypoglycemia due to insulin   Diastolic heart failure ([place])   Hypertensive urgency   Obesity, Class III, BMI 40-49.9 (morbid obesity) ([place])    Time spent: [facility] Pager AMION. If 7PM-7AM, please contact night-coverage at [email or web address], password [facility] 11/07/2017, 7:06 PM  LOS: 1 day

## 2017-11-07 NOTE — ED Notes (Signed)
Spoke with hospitalist in reference to pain meds ; states will enter orders for same

## 2017-11-07 NOTE — ED Notes (Signed)
Heart Healthy Diet was ordered for Lunch. 

## 2017-11-07 NOTE — ED Notes (Signed)
Heart Healthy diet lunch tray ordered. Redirected pt's lunch tray to 5C18.

## 2017-11-07 NOTE — Consult Note (Addendum)
Cardiology Consult    Patient ID: DOLCE SYLVIA MRN: [id number], DOB/AGE: 10-28-1960   Admit date: 11/06/2017 Date of Consult: 11/07/2017  Primary Physician: [facility] Primary Cardiologist: Dr. Bettina Gavia, Dr. [name] Requesting Provider: Dr. Tamala Julian Reason for Consultation: Chest pain  Dana [name] is a 57 y.o. female who is being seen today for the evaluation of  at the request of No ref. provider found.   Patient Profile    57 yo female with PMH of obesity, diastolic HF, DM, neuropathy, HTN, HL, CAD ( cath 6/18 with patent stents), anemia, OSA and leg wound who presented with chest pain.   Past Medical History   Past Medical History:  Diagnosis Date  . CAD (coronary artery disease)    08/2016 DES to RCA, also with history of LAD stenting.   . Diabetes ([place])   . Diastolic CHF ([place])   . Hyperlipidemia   . Hypertension   . OSA (obstructive sleep apnea)     Past Surgical History:  Procedure Laterality Date  . RIGHT/LEFT HEART CATH AND CORONARY ANGIOGRAPHY N/A 01/07/2017   Procedure: Right/Left Heart Cath and Coronary Angiography;  Surgeon: Larey Dresser, MD;  Location: [facility];  Service: Cardiovascular;  Laterality: N/A;     Allergies  Allergies  Allergen Reactions  . Naproxen Hives and Rash  . Celecoxib Other (See Comments)    Stomach pain  . Aspirin Other (See Comments)    CAN NOT TAKE DUE TO ULCERS  . Glucosamine Hives, Swelling and Other (See Comments)    Angioedema   . Metoclopramide Other (See Comments)    Facial twitching and stuttering  . Metolazone Other (See Comments)    Acute renal failure  . Shellfish-Derived Products Hives, Swelling and Other (See Comments)    Angioedema   . Tramadol Nausea And Vomiting and Other (See Comments)         History of Present Illness    Dana [name] is a 63 female with past history of obesity, diastolic HF, DM, neuropathy, HTN, HL, CAD ( cath 6/18 with patent stents), anemia, OSA and leg  wound.  She has been followed by Dr. Bettina Gavia as an outpatient, and was referred to the advanced heart failure clinic with assistance with her diuretic.  Her last heart cath was done in June 2018 which showed patent stent in the mid LAD, along with long patent stented segment in the PDA, with mild nonobstructive CAD in other areas.  Noted a normal LVEDP and normal cardiac output.  She was admitted back in April 2018 with volume overload and diuresis with IV Lasix.  She was placed on torsemide 40 mg twice a day, and metolazone which was eventually reduced because she had acute kidney injury that required rehospitalization.  She was last seen in the heart failure clinic on 11/18 and stated she had not been doing very well.  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  Had recently seen her nephrologist at [facility] and told to take metolazone 3 times a week for volume overload.  Recommendations were given to continue her torsemide 60 mg twice daily, but metolazone only weekly as needed.  Also had added spironolactone 12.5 mg daily added.  Of note patient also has a history of PAF with ablation in 2016, with no evidence of recurrence therefore she has not been on anticoagulation.  She presented to the ED on 11/06/2017.  Reports the evening prior  she had a sudden episode of chest pressure while getting up out of her recliner.  Used sublingual nitro with resolution of pain.  States she got up around 3:30 AM the following morning to call her mom she normally does and walk to the bathroom and felt extremely weak.  During the day she reports just generalized malaise, and weakness along with headache and some exertional dyspnea.  When her husband got home that evening she told him she felt like she needed to be checked out at the ER.  States on the way to the ED she had another episode of chest pressure.  Checked in the ER and while waiting her blood sugar dropped into the 50s.  Of  note patient reports that her weight has increased 4 pounds over the past 2 days, and she has had worsening lower extremity edema despite being compliant with her home medications.  Labs showed stable electrolytes, creatinine 1, POC troponin 0.05 delta troponin, 0.05> 0.03, hemoglobin 9.6, LDL 136.  EKG showed sinus rhythm with no acute ST/T wave abnormalities.  Chest x-ray showed cardiomegaly with pulmonary edema.  She was admitted to internal medicine, and started on IV Lasix.  No I's and O's documented, but patient reports significant urine output.  She has not had any additional chest pain or pressure since admission.    Inpatient Medications    . carvedilol  6.25 mg Oral BID WC  . clopidogrel  75 mg Oral QHS  . enoxaparin (LOVENOX) injection  40 mg Subcutaneous Daily  . ferrous sulfate  325 mg Oral BID WC  . furosemide  60 mg Intravenous BID  . isosorbide mononitrate  15 mg Oral Daily  . latanoprost  1 drop Both Eyes QHS  . losartan  25 mg Oral Daily  . pravastatin  80 mg Oral QHS    Family History    Family History  Problem Relation Age of Onset  . Hypertension Father     Social History    Social History   Socioeconomic History  . Marital status: Married    Spouse name: Not on file  . Number of children: Not on file  . Years of education: Not on file  . Highest education level: Not on file  Occupational History  . Not on file  Social Needs  . Financial resource strain: Not on file  . Food insecurity:    Worry: Not on file    Inability: Not on file  . Transportation needs:    Medical: Not on file    Non-medical: Not on file  Tobacco Use  . Smoking status: Never Smoker  . Smokeless tobacco: Never Used  Substance and Sexual Activity  . Alcohol use: Not Currently  . Drug use: Not Currently  . Sexual activity: Not on file  Lifestyle  . Physical activity:    Days per week: Not on file    Minutes per session: Not on file  . Stress: Not on file  Relationships    . Social connections:    Talks on phone: Not on file    Gets together: Not on file    Attends religious service: Not on file    Active member of club or organization: Not on file    Attends meetings of clubs or organizations: Not on file    Relationship status: Not on file  . Intimate partner violence:    Fear of current or ex partner: Not on file    Emotionally abused: Not on  file    Physically abused: Not on file    Forced sexual activity: Not on file  Other Topics Concern  . Not on file  Social History Narrative  . Not on file     Review of Systems    See HPI  All other systems reviewed and are otherwise negative except as noted above.  Physical Exam    Blood pressure (!) 163/55, pulse 74, temperature 98.3 F (36.8 C), temperature source Oral, resp. rate 14, height 5' 4.5" (1.638 m), weight 277 lb (125.6 kg), SpO2 98 %.  General: Pleasant, WF NAD Psych: Normal affect. Neuro: Alert and oriented X 3. Moves all extremities spontaneously. HEENT: Normal  Neck: Supple, no JVD. Lungs:  Resp regular and unlabored, CTA. Heart: RRR no s3, s4, soft systolic murmur. Abdomen: Soft, non-tender, non-distended, BS + x 4.  Extremities: No clubbing, cyanosis 1+ pitting edema bilaterally. DP/PT/Radials 2+ and equal bilaterally. L geat to and 4 toe amutated    Labs    Troponin [facility] of Care Test) Recent Labs    11/06/17 1742  TROPIPOC 0.05   Recent Labs    11/07/17 0122 11/07/17 0445  TROPONINI 0.05* 0.03*   Lab Results  Component Value Date   WBC 12.8 (H) 11/07/2017   HGB 9.0 (L) 11/07/2017   HCT 28.8 (L) 11/07/2017   MCV 80.2 11/07/2017   PLT 311 11/07/2017    Recent Labs  Lab 11/06/17 1738 11/07/17 0445  NA 140 139  K 4.0 3.7  CL 108 106  CO2 26 22  BUN 20 20  CREATININE 1.00 1.00  CALCIUM 9.3 9.0  PROT 7.6  --   BILITOT 0.4  --   ALKPHOS 146*  --   ALT 28  --   AST 30  --   GLUCOSE 54* 145*   Lab Results  Component Value Date   CHOL 203 (H)  11/07/2017   HDL 44 11/07/2017   LDLCALC 136 (H) 11/07/2017   TRIG 116 11/07/2017   No results found for: [facility]   Radiology Studies    Dg Chest 2 View  Result Date: 11/06/2017 CLINICAL DATA:  Weakness today. Headache and blurred vision. Chest tightness beginning at 5:15 p.m. EXAM: CHEST - 2 VIEW COMPARISON:  Single-view of the chest 11/02/2016 02/19/2017. FINDINGS: There is cardiomegaly and interstitial pulmonary edema. No consolidative process, pneumothorax or effusion. Aortic atherosclerosis is noted. No acute bony abnormality. IMPRESSION: Cardiomegaly and interstitial edema. Electronically Signed   By: Inge Rise M.D.   On: 11/06/2017 18:31   ECG & Cardiac Imaging    EKG:  The EKG was personally reviewed and demonstrates SR  Echo: 06/20/17  Study Conclusions  - Left ventricle: The cavity size was normal. Wall thickness was   increased in a pattern of mild LVH. Systolic function was normal.   The estimated ejection fraction was in the range of 60% to 65%.   Wall motion was normal; there were no regional wall motion   abnormalities. Left ventricular diastolic function parameters   were normal.  January 07, 2017  Coronary Findings   Diagnostic  Dominance: Right  Left Main  No significant disease.  Left Anterior Descending  30% ostial stenosis. 30-40% proximal stenosis. Patent mid LAD stent. 40-50% mid LAD stenosis distal to stent.  Left Circumflex  Large OM1. 30% mid LCx stenosis after OM1. Moderate OM2 with 30-40% proximal stenosis. 30% distal LCx stenosis.  Right Coronary Artery  Serial 30-40% stenoses in the proximal RCA. Long stented  segment in the PDA was patent.  Intervention   No interventions have been documented.  Right Heart   Right Heart Pressures RHC Procedural Findings: Hemodynamics (mmHg) RA mean 7 RV 31/6 PA 31/10, mean 20 PCWP mean 7 LV 142/12 AO 134/64  Oxygen saturations: PA 66% AO 99%  Cardiac Output (Fick) 7.31  Cardiac Index (Fick)  3.37    Assessment & Plan    57 yo female with PMH of obesity, diastolic HF, DM, neuropathy, HTN, HL, CAD ( cath 6/18 with patent stents), anemia, PAF (s/p ablation), pAD (s/p amutation) OSA and leg wound who presented with chest pain.   1. Chest Pressure: Reports episode 2 evenings ago and again while driving in to the ED. Improved with nitro. Does have CAD history. EKG non acute. Trop neg x2. No further chest pain. Recent cath 6/18 with normal RH pressures and patent stents. Consider increasing nitrate therapy.   2. Acute on chronic diastolic HF: reports weight gain over at least 4 lbs over the past 2 days, with worsening LE edema. Currently on IV lasix 60mg  BID, no I/Os noted but she reports good UOP. Suspect chest pressure could be related to volume overload. Would continue to diuresis through today.  -- echo pending, but last echo 12/18 with normal EF  3. HTN: stable  4. Anemia: Hgb 9.6>>9.0, on Iron supplement. No reports of overt bleeding.   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  Dana [name]

## 2017-11-08 LAB — BASIC METABOLIC PANEL
Anion gap: 11 (ref 5–15)
BUN: 32 mg/dL — ABNORMAL HIGH (ref 6–20)
CO2: 24 mmol/L (ref 22–32)
Calcium: 8.3 mg/dL — ABNORMAL LOW (ref 8.9–10.3)
Chloride: 98 mmol/L — ABNORMAL LOW (ref 101–111)
Creatinine, Ser: 1.69 mg/dL — ABNORMAL HIGH (ref 0.44–1.00)
GFR calc Af Amer: 39 mL/min — ABNORMAL LOW (ref >60)
GFR calc non Af Amer: 33 mL/min — ABNORMAL LOW (ref >60)
Glucose, Bld: 424 mg/dL — ABNORMAL HIGH (ref 65–99)
Potassium: 4 mmol/L (ref 3.5–5.1)
Sodium: 133 mmol/L — ABNORMAL LOW (ref 135–145)

## 2017-11-08 LAB — GLUCOSE, CAPILLARY
Glucose-Capillary: 374 mg/dL — ABNORMAL HIGH (ref 65–99)
Glucose-Capillary: 311 mg/dL — ABNORMAL HIGH (ref 65–99)
Glucose-Capillary: 284 mg/dL — ABNORMAL HIGH (ref 65–99)
Glucose-Capillary: 266 mg/dL — ABNORMAL HIGH (ref 65–99)
Glucose-Capillary: 296 mg/dL — ABNORMAL HIGH (ref 65–99)

## 2017-11-08 LAB — HIGH SENSITIVITY CRP: CRP, High Sensitivity: 17.43 mg/L — ABNORMAL HIGH (ref 0.00–3.00)

## 2017-11-08 MED ORDER — INSULIN ASPART 100 UNIT/ML ~~LOC~~ SOLN
0.0000 [IU] | Freq: Three times a day (TID) | SUBCUTANEOUS | Status: DC
  Administered 2017-11-08: 8 [IU] via SUBCUTANEOUS
  Administered 2017-11-09 – 2017-11-11 (×3): 3 [IU] via SUBCUTANEOUS

## 2017-11-08 MED ORDER — INSULIN NPH (HUMAN) (ISOPHANE) 100 UNIT/ML ~~LOC~~ SUSP
22.0000 [IU] | Freq: Two times a day (BID) | SUBCUTANEOUS | Status: DC
  Administered 2017-11-08 – 2017-11-11 (×6): 22 [IU] via SUBCUTANEOUS
  Filled 2017-11-08: qty 10

## 2017-11-08 MED ORDER — INSULIN ASPART 100 UNIT/ML ~~LOC~~ SOLN
4.0000 [IU] | Freq: Three times a day (TID) | SUBCUTANEOUS | Status: DC
  Administered 2017-11-08 – 2017-11-10 (×5): 4 [IU] via SUBCUTANEOUS

## 2017-11-08 MED ORDER — ROSUVASTATIN CALCIUM 10 MG PO TABS
40.0000 mg | ORAL_TABLET | Freq: Every day | ORAL | Status: DC
  Administered 2017-11-08 – 2017-11-10 (×3): 40 mg via ORAL
  Filled 2017-11-08 (×3): qty 4

## 2017-11-08 MED ORDER — INSULIN ASPART 100 UNIT/ML ~~LOC~~ SOLN
0.0000 [IU] | Freq: Every day | SUBCUTANEOUS | Status: DC
  Administered 2017-11-08: 3 [IU] via SUBCUTANEOUS

## 2017-11-08 NOTE — Progress Notes (Signed)
[facility] PROGRESS NOTE  Dana [name] DDU:[id number] DOB: [birth year] DOA: 11/06/2017 PCP: [facility]   57 yo female with PMH of obesity, diastolic HF, DM, neuropathy, HTN, HL, CAD ( cath 6/18 with patent stents), anemia, OSA and leg wound who presented with chest pain. She presented to the ED on 11/06/2017.  Reports the evening prior she had a sudden episode of chest pressure while getting up out of her recliner.EKG showed sinus rhythm with no acute ST/T wave abnormalities.  Chest x-ray showed cardiomegaly with pulmonary edema. Cardiology consulted and following  Assessment/Plan:  Acute on chronic diastolic CHF: Still with DOE and some LE edema - LV EF: 65% -   70% - CP thought to be due to fluid - receiving lasix 60 mg iv BID , now dc'd due to bump in her cr 1.0>1.69  Hypoglycemia due to insulin, history of uncontrolled type 1 diabetes: Acute.  Initial blood glucose was started to be as low as 36 on admission.  Patient given crackers and orange juice and half amp of D50 with improvement of blood sugars. - Hypoglycemic protocols - now [facility]  Hypertensive urgency (resolved)- Continue Coreg, Losartan - Hydralazine IV prn  Right lower extremity wound: Patient reports recently being discharged from wound care clinic.  Denies any change in appearance of wound.   Chest pain in patient with CAD s/p PCI to LAD and PDA: no complaints of CP since arrival to the ED. Trop negative x2. EKG non-ischemic. Suspect CP could be 2/2 volume overload. Echo did not show  wall motion abnormalities. - Ischemic testing by cards in the setting of abnl renal fn    Normocytic  anemia: Hemoglobin 9.6 >9.0 on admission.  Review of records shows patient's hemoglobin has been 8-9 since 02/2017.  Patient does not complain of bleeding. - Recheck CBC in a.m.   History of paroxysmal atrial fibrillation status post ablation Tele shows NSR  Morbid obesity: BMI 46.8  DVT prophylaxis:  Lovenox Code Status: Full Family Communication: No family present at bedside Disposition Plan: Likely discharge home once renal fn stabilizes and plan is clear   Consults called: cards         HPI/Subjective: No cp pain currently , still somewhat SOB   Objective: Vitals:   11/08/17 0440 11/08/17 1152  BP: (!) 147/52 (!) 163/56  Pulse: 72 73  Resp: 16 17  Temp: (!) 97.4 F (36.3 C) 98.4 F (36.9 C)  SpO2: 99% 97%    Intake/Output Summary (Last 24 hours) at 11/08/2017 1556 Last data filed at 11/08/2017 1340 Gross per 24 hour  Intake 360 ml  Output 2100 ml  Net -1740 ml   Filed Weights   11/06/17 1733 11/07/17 1744 11/08/17 0440  Weight: 125.6 kg (277 lb) 113.2 kg (249 lb 9 oz) 111.7 kg (246 lb 4.1 oz)    Exam:  General: NCAT, NAD Cardiovascular: RRR, no MRG Respiratory: CTAB, nl wob Abdomen: NS, BS+, NTTP Musculoskeletal: moving all extr, nl tone     Data Reviewed: Basic Metabolic Panel: Recent Labs  Lab 11/06/17 1738 11/07/17 0445 11/08/17 0330  NA 140 139 133*  K 4.0 3.7 4.0  CL 108 106 98*  CO2 26 22 24   GLUCOSE 54* 145* 424*  BUN 20 20 32*  CREATININE 1.00 1.00 1.69*  CALCIUM 9.3 9.0 8.3*   Liver Function Tests: Recent Labs  Lab 11/06/17 1738  AST 30  ALT 28  ALKPHOS 146*  BILITOT 0.4  PROT 7.6  ALBUMIN  3.2*   Recent Labs  Lab 11/06/17 1738  LIPASE 23   No results for input(s): AMMONIA in the last 168 hours. CBC: Recent Labs  Lab 11/06/17 1738 11/07/17 0445  WBC 11.6* 12.8*  NEUTROABS  --  9.7*  HGB 9.6* 9.0*  HCT 31.1* 28.8*  MCV 81.0 80.2  PLT 330 311   Cardiac Enzymes: Recent Labs  Lab 11/07/17 0122 11/07/17 0445  TROPONINI 0.05* 0.03*   BNP (last 3 results) Recent Labs    01/31/17 1503 11/06/17 1734  BNP 23.2 61.1    ProBNP (last 3 results) No results for input(s): PROBNP in the last 8760 hours.  CBG: Recent Labs  Lab 11/07/17 1547 11/07/17 2054 11/08/17 0604 11/08/17 1154 11/08/17 1332  GLUCAP  251* 386* 374* 311* 284*    No results found for this or any previous visit (from the past 240 hour(s)).   Studies: Dg Chest 2 View  Result Date: 11/06/2017 CLINICAL DATA:  Weakness today. Headache and blurred vision. Chest tightness beginning at 5:15 p.m. EXAM: CHEST - 2 VIEW COMPARISON:  Single-view of the chest 11/02/2016 02/19/2017. FINDINGS: There is cardiomegaly and interstitial pulmonary edema. No consolidative process, pneumothorax or effusion. Aortic atherosclerosis is noted. No acute bony abnormality. IMPRESSION: Cardiomegaly and interstitial edema. Electronically Signed   By: Inge Rise M.D.   On: 11/06/2017 18:31    Scheduled Meds: . carvedilol  6.25 mg Oral BID WC  . clopidogrel  75 mg Oral QHS  . enoxaparin (LOVENOX) injection  40 mg Subcutaneous Daily  . ferrous sulfate  325 mg Oral BID WC  . insulin aspart  0-15 Units Subcutaneous TID WC  . insulin aspart  0-5 Units Subcutaneous QHS  . insulin aspart  4 Units Subcutaneous TID WC  . insulin NPH Human  22 Units Subcutaneous BID AC & HS  . isosorbide mononitrate  15 mg Oral Daily  . latanoprost  1 drop Both Eyes QHS  . losartan  25 mg Oral Daily  . rosuvastatin  40 mg Oral q1800   Continuous Infusions:  Principal Problem:   Chest pain Active Problems:   Uncontrolled type 1 diabetes mellitus with diabetic polyneuropathy (HCC)   Hypoglycemia due to insulin   Diastolic heart failure ([place])   Hypertensive urgency   Obesity, Class III, BMI 40-49.9 (morbid obesity) ([place])    Time spent: [place]  [facility] Pager AMION. If 7PM-7AM, please contact night-coverage at [email or web address], password [facility] 11/08/2017, 3:56 PM  LOS: 2 days

## 2017-11-08 NOTE — Progress Notes (Addendum)
Inpatient Diabetes Program Recommendations  AACE/ADA: New Consensus Statement on Inpatient Glycemic Control (2015)  Target Ranges:  Prepandial:   less than 140 mg/dL      Peak postprandial:   less than 180 mg/dL (1-2 hours)      Critically ill patients:  140 - 180 mg/dL   Results for Dana [name], Dana [name] (MRN [id number]) as of 11/08/2017 09:33  Ref. Range 11/06/2017 19:29 11/06/2017 19:46 11/06/2017 21:02 11/07/2017 00:23 11/07/2017 04:06 11/07/2017 09:20 11/07/2017 15:47 11/07/2017 20:54 11/08/2017 06:04  Glucose-Capillary Latest Ref Range: 65 - 99 mg/dL 36 (LL) 45 (L) 128 (H) 115 (H) 145 (H) 188 (H) 251 (H) 386 (H) 374 (H)   Review of Glycemic Control  Diabetes history: Per H&P, DM1 however noted in Care Everywhere patient use to take Humulin R U500 so likely DM2 Outpatient Diabetes medications: NPH 30 units BID, Regular 7-15 units TID with meals Current orders for Inpatient glycemic control: Novolog 0-15 units TID with meals  Inpatient Diabetes Program Recommendations:  Insulin - Basal: Please consider ordering NPH 22 units BID starting now (based on 111 kg x 0.4 units). Correction (SSI): Please consider ordering Novolog 0-5 units QHS for bedtime correction. Insulin - Meal Coverage: Please consider ordering Novolog 4 units TID with meals for meal coverage if patient eats at least 50% of meals.   NOTE: Noted patient initially presented with hypoglycemia which resolved yesterday and no basal insulin has been given since admitted. As a result, fasting glucose up to 374 mg/dl this morning. Patient is followed as an outpatient by Dr. Tamala Julian (Endocrinologist) and was last seen on 06/19/2017. Will plan to talk with patient today.  Addendum 11/08/17@12 :35-Spoke with patient about diabetes and home regimen for diabetes control. Patient reports that she is followed by Dr. Tamala Julian for diabetes management and currently she takes Novolin NPH 30 units BID and Novolin Regular 7-15 units TID with meals (based on glucose and  meal intake) as an outpatient for diabetes control. Patient reports that she is taking insulin as prescribed. Patient states that she checks her glucose 3-4 times per day and that it is usually 80-150's mg/dl. Patient states that she has been having issues with hypoglycemia for years and in the past she was on Humulin R U500 which had to be stopped because of severe hypoglycemia with it. Patient has been on Levemir and Novolog in the past as well but could not afford to continue on those insulins so her Endocrinologist has her on Novolin NPH and Regular since they are $25 per vial.  Patient reports that she has hypoglycemia unawareness. She states that she has a cat and it has been alerting her by getting in her face when her glucose is low and when it does that and she has checked her glucose she has been low every time.  Patient reports that her last A1C was 7.2% in December. Discussed glucose and A1C goals. Discussed danger of hypoglycemia as well and explained that if she can go 2-3 months without any issues with hypoglycemia, she may be able to regain the ability to recognize hypoglycemia. Patient has a FreeStyle Libre (flash glucose monitoring sensor) at home but she has not started using it yet. Discussed how the [employer] works and can provide more data for her and her Endocrinologist to make insulin adjustments further. Explained that it will provide data on glucose trends and the doctor can decide which insulin needs to be adjusted. Asked patient to start using the [employer] when she gets  home and to keep a log book of glucose readings and insulin taken which she will need to take to doctor appointments. Patient very appreciative of information discussed. Informed patient of recommendations to restart NPH and add meal coverage but explained it would be up to the doctor whether to order them or not. Patient verbalized understanding of information discussed and she states that she has no further  questions at this time related to diabetes.  Thanks, Barnie Alderman, RN, MSN, CDE Diabetes Coordinator Inpatient Diabetes Program [phone] (Team Pager from 8am to 5pm)

## 2017-11-08 NOTE — Progress Notes (Addendum)
Progress Note  Patient Name: CATY TESSLER Date of Encounter: 11/08/2017  Primary Cardiologist: Dr. [name] and Dr. Bettina Gavia  Subjective   Patient notes continued DOE and intermittent palpitations. Has not had reoccurrence of CP since arrival to the ED.   Inpatient Medications    Scheduled Meds: . carvedilol  6.25 mg Oral BID WC  . clopidogrel  75 mg Oral QHS  . enoxaparin (LOVENOX) injection  40 mg Subcutaneous Daily  . ferrous sulfate  325 mg Oral BID WC  . furosemide  60 mg Intravenous BID  . insulin aspart  0-15 Units Subcutaneous TID WC  . isosorbide mononitrate  15 mg Oral Daily  . latanoprost  1 drop Both Eyes QHS  . losartan  25 mg Oral Daily  . pravastatin  80 mg Oral QHS   Continuous Infusions:  PRN Meds: acetaminophen, gi cocktail, hydrALAZINE, HYDROcodone-acetaminophen, morphine injection, nitroGLYCERIN, ondansetron (ZOFRAN) IV, zolpidem   Vital Signs    Vitals:   11/07/17 1936 11/07/17 1940 11/08/17 0004 11/08/17 0440  BP: 123/61 (!) 144/57  (!) 147/52  Pulse: 76 73  72  Resp: (!) 22 15  16   Temp:  98.2 F (36.8 C)  (!) 97.4 F (36.3 C)  TempSrc:  Oral  Oral  SpO2: 97% 95% 97% 99%  Weight:    246 lb 4.1 oz (111.7 kg)  Height:        Intake/Output Summary (Last 24 hours) at 11/08/2017 0751 Last data filed at 11/08/2017 0327 Gross per 24 hour  Intake -  Output 1200 ml  Net -1200 ml   Filed Weights   11/06/17 1733 11/07/17 1744 11/08/17 0440  Weight: 277 lb (125.6 kg) 249 lb 9 oz (113.2 kg) 246 lb 4.1 oz (111.7 kg)    Telemetry    NSR - Personally Reviewed  Physical Exam   GEN: Sitting on the edge of her bed in no acute distress.   Neck: No JVD, no carotid bruits Cardiac: RRR, no murmurs, rubs, or gallops.  Respiratory: Clear to auscultation bilaterally, no wheezes/ rales/ rhonchi GI: NABS, Soft, nontender, non-distended  MS: 1+ edema; Dressing over RLE wound C/D/I; s/p amputation of 1st and 5th L foot toes Neuro:  Nonfocal, moving all  extremities spontaneously Psych: Normal affect   Labs    Chemistry Recent Labs  Lab 11/06/17 1738 11/07/17 0445 11/08/17 0330  NA 140 139 133*  K 4.0 3.7 4.0  CL 108 106 98*  CO2 26 22 24   GLUCOSE 54* 145* 424*  BUN 20 20 32*  CREATININE 1.00 1.00 1.69*  CALCIUM 9.3 9.0 8.3*  PROT 7.6  --   --   ALBUMIN 3.2*  --   --   AST 30  --   --   ALT 28  --   --   ALKPHOS 146*  --   --   BILITOT 0.4  --   --   GFRNONAA >60 >60 33*  GFRAA >60 >60 39*  ANIONGAP 6 11 11      Hematology Recent Labs  Lab 11/06/17 1738 11/07/17 0445  WBC 11.6* 12.8*  RBC 3.84* 3.59*  HGB 9.6* 9.0*  HCT 31.1* 28.8*  MCV 81.0 80.2  MCH 25.0* 25.1*  MCHC 30.9 31.3  RDW 15.3 15.4  PLT 330 311    Cardiac Enzymes Recent Labs  Lab 11/07/17 0122 11/07/17 0445  TROPONINI 0.05* 0.03*    Recent Labs  Lab 11/06/17 1742  TROPIPOC 0.05     BNP Recent Labs  Lab 11/06/17 1734  BNP 61.1     DDimer No results for input(s): DDIMER in the last 168 hours.   Radiology    Dg Chest 2 View  Result Date: 11/06/2017 CLINICAL DATA:  Weakness today. Headache and blurred vision. Chest tightness beginning at 5:15 p.m. EXAM: CHEST - 2 VIEW COMPARISON:  Single-view of the chest 11/02/2016 02/19/2017. FINDINGS: There is cardiomegaly and interstitial pulmonary edema. No consolidative process, pneumothorax or effusion. Aortic atherosclerosis is noted. No acute bony abnormality. IMPRESSION: Cardiomegaly and interstitial edema. Electronically Signed   By: Inge Rise M.D.   On: 11/06/2017 18:31    Cardiac Studies   Echocardiogram 11/07/17: Study Conclusions  - Left ventricle: The cavity size was normal. Wall thickness was   increased in a pattern of mild LVH. Systolic function was   vigorous. The estimated ejection fraction was in the range of 65%   to 70%. Wall motion was normal; there were no regional wall   motion abnormalities. Doppler parameters are consistent with   pseudonormal left  ventricular relaxation (grade 2 diastolic   dysfunction). The E/e&' ratio is >20, suggesting markedly elevated   LV filling pressure. - Mitral valve: Calcified annulus. Mildly thickened leaflets .   There was trivial regurgitation. - Left atrium: The atrium was mildly dilated. - Inferior vena cava: The vessel was normal in size. The   respirophasic diameter changes were in the normal range (>= 50%),   consistent with normal central venous pressure.  Impressions:  - LVEF 65-70%, mild LVH, normal wall motion, grade 2 DD, elevated   LV Filling pressure, trivial MR, mild LAE, normal IVC.  Right/Left heart catheterization 12/2016:  Conclusion  1. Normal right and left heart filling pressures.  2. Normal cardiac output.  3. Nonobstructive coronary disease, stents patent.   Continue current therapy.    Coronary Findings  Dominance: Right  Left Main  No significant disease.  Left Anterior Descending  30% ostial stenosis. 30-40% proximal stenosis. Patent mid LAD stent. 40-50% mid LAD stenosis distal to stent.  Left Circumflex  Large OM1. 30% mid LCx stenosis after OM1. Moderate OM2 with 30-40% proximal stenosis. 30% distal LCx stenosis.  Right Coronary Artery  Serial 30-40% stenoses in the proximal RCA. Long stented segment in the PDA was patent.      Patient Profile     57 y.o. female with PMH of obesity, chronic diastolic HF, DM, neuropathy, HTN, HLD, CAD with PCI to LAD and PDA (last LHC 12/2016 with patent stents), anemia, PAF (s/p ablation), PAD (s/p amputations of multiple toes with chronic LE wound), and OSA who presented with CP and was found to be volume overloaded.  Assessment & Plan    1. Acute on chronic diastolic CHF: Still with DOE and some LE edema. Started on IV lasix 60mg  BID with -1.2L this admission - no documented intake to report net output. Wt 249lbs>246lbs. Cr bumped from 1.0>1.69 this morning. Echo this admission with EF 65-70%, no wall motion  abnormalities, G2DD, and trivial MR.  - Can consider decreasing IV lasix dose given bump in Cr. - Anticipate transition back to home po torsemide in the next 24-48hours.  - Continue to monitor strict I&Os and daily weights  2. Chest pain in patient with CAD s/p PCI to LAD and PDA: no complaints of CP since arrival to the ED. Trop negative x2. EKG non-ischemic. Suspect CP could be 2/2 volume overload. Echo this admission with stable EF and no wall motion abnormalities. - Ischemic  testing deferred at this time. If CP reoccurs once patient is diuresed, can consider cardiac catheterization  3. HTN: BP elevated yesterday but stable - Continue current regimen  4. AKI: Cr 1.0>1.69 with IV diuresis - Monitor Cr closely with diuresis.  For questions or updates, please contact [place] Please consult [email or web address] for contact info under Cardiology/STEMI.      Signed, Abigail Butts, PA-C  11/08/2017, 7:51 AM   [phone]  Pt seen and exmained  I agree with findings as noted abaove by K Kroer    Pt still with some tightness  Not as bad  Still with some sx of volume increase ON exam:   Lungs are CTA   Cardiac exam RRR  No S3   No signif murmur   Abd bengin  Ext with 1+ edema    WOuld repeat lab BUN/Cr today   Hold pm lasix  LDL is not optimally controlled   Switch from pravastatin to Crestor.  Dorris Carnes

## 2017-11-08 NOTE — Consult Note (Addendum)
[facility] Nurse wound consult note Reason for Consult: Consult requested for right leg wound.  Pt has been followed by the outpatient wound care center and they have ordered Collagen dressings every other day.  Pt states the wounds have greatly improved.  Informed patient that we do not have Collagen dressings in the [facility]; she has her own from home in her purse. Wound type: Healing full thickness stasis ulcer to right lower calf.  Total affected area is approx 6X3cm: all areas have closed except 3 shallow sites; each is .2X.2X.1cm or smaller. Pink dry wound beds, no odor or drainage. Dressing procedure/placement/frequency: Pt applied Prisma collagen dressing and foam dressing to protect the wound bed, and states she will perform her own dressing changes every other day. Please re-consult if further assistance is needed.  Thank-you,  Julien Girt MSN, [place], [place], [place], [place]

## 2017-11-09 DIAGNOSIS — I200 Unstable angina: Secondary | ICD-10-CM

## 2017-11-09 DIAGNOSIS — I2489 Other forms of acute ischemic heart disease: Secondary | ICD-10-CM

## 2017-11-09 DIAGNOSIS — I248 Other forms of acute ischemic heart disease: Secondary | ICD-10-CM

## 2017-11-09 DIAGNOSIS — I5033 Acute on chronic diastolic (congestive) heart failure: Secondary | ICD-10-CM

## 2017-11-09 DIAGNOSIS — N170 Acute kidney failure with tubular necrosis: Secondary | ICD-10-CM

## 2017-11-09 LAB — COMPREHENSIVE METABOLIC PANEL WITH GFR
ALT: 17 U/L (ref 14–54)
AST: 15 U/L (ref 15–41)
Albumin: 3 g/dL — ABNORMAL LOW (ref 3.5–5.0)
Alkaline Phosphatase: 138 U/L — ABNORMAL HIGH (ref 38–126)
Anion gap: 11 (ref 5–15)
BUN: 44 mg/dL — ABNORMAL HIGH (ref 6–20)
CO2: 25 mmol/L (ref 22–32)
Calcium: 8.3 mg/dL — ABNORMAL LOW (ref 8.9–10.3)
Chloride: 100 mmol/L — ABNORMAL LOW (ref 101–111)
Creatinine, Ser: 1.94 mg/dL — ABNORMAL HIGH (ref 0.44–1.00)
GFR calc Af Amer: 33 mL/min — ABNORMAL LOW
GFR calc non Af Amer: 28 mL/min — ABNORMAL LOW
Glucose, Bld: 231 mg/dL — ABNORMAL HIGH (ref 65–99)
Potassium: 3.8 mmol/L (ref 3.5–5.1)
Sodium: 136 mmol/L (ref 135–145)
Total Bilirubin: 0.7 mg/dL (ref 0.3–1.2)
Total Protein: 7.1 g/dL (ref 6.5–8.1)

## 2017-11-09 LAB — CBC
HCT: 28.2 % — ABNORMAL LOW (ref 36.0–46.0)
Hemoglobin: 8.9 g/dL — ABNORMAL LOW (ref 12.0–15.0)
MCH: 25.1 pg — ABNORMAL LOW (ref 26.0–34.0)
MCHC: 31.6 g/dL (ref 30.0–36.0)
MCV: 79.4 fL (ref 78.0–100.0)
Platelets: 320 10*3/uL (ref 150–400)
RBC: 3.55 MIL/uL — ABNORMAL LOW (ref 3.87–5.11)
RDW: 15 % (ref 11.5–15.5)
WBC: 11.5 10*3/uL — ABNORMAL HIGH (ref 4.0–10.5)

## 2017-11-09 LAB — GLUCOSE, CAPILLARY
Glucose-Capillary: 165 mg/dL — ABNORMAL HIGH (ref 65–99)
Glucose-Capillary: 164 mg/dL — ABNORMAL HIGH (ref 65–99)
Glucose-Capillary: 283 mg/dL — ABNORMAL HIGH (ref 65–99)
Glucose-Capillary: 192 mg/dL — ABNORMAL HIGH (ref 65–99)

## 2017-11-09 MED ORDER — ISOSORBIDE MONONITRATE ER 30 MG PO TB24
15.0000 mg | ORAL_TABLET | Freq: Once | ORAL | Status: AC
  Administered 2017-11-09: 15 mg via ORAL
  Filled 2017-11-09: qty 1

## 2017-11-09 MED ORDER — ISOSORBIDE MONONITRATE ER 30 MG PO TB24
30.0000 mg | ORAL_TABLET | Freq: Every day | ORAL | Status: DC
  Administered 2017-11-10 – 2017-11-11 (×2): 30 mg via ORAL
  Filled 2017-11-09 (×2): qty 1

## 2017-11-09 MED ORDER — HYDRALAZINE HCL 20 MG/ML IJ SOLN: 10.0000 mg | Freq: Four times a day (QID) | INTRAMUSCULAR | Status: DC | PRN

## 2017-11-09 MED ORDER — SODIUM CHLORIDE 0.9 % IV SOLN
INTRAVENOUS | Status: AC
  Administered 2017-11-09: 13:00:00 via INTRAVENOUS

## 2017-11-09 NOTE — Progress Notes (Signed)
Progress Note  Patient Name: Dana [name] Date of Encounter: 11/09/2017  Primary Cardiologist: Dr. [name] and Dr. Bettina Gavia  Subjective   No chest pain, improved SOB.  Inpatient Medications    Scheduled Meds: . carvedilol  6.25 mg Oral BID WC  . clopidogrel  75 mg Oral QHS  . enoxaparin (LOVENOX) injection  40 mg Subcutaneous Daily  . ferrous sulfate  325 mg Oral BID WC  . insulin aspart  0-15 Units Subcutaneous TID WC  . insulin aspart  0-5 Units Subcutaneous QHS  . insulin aspart  4 Units Subcutaneous TID WC  . insulin NPH Human  22 Units Subcutaneous BID AC & HS  . isosorbide mononitrate  15 mg Oral Daily  . latanoprost  1 drop Both Eyes QHS  . rosuvastatin  40 mg Oral q1800   Continuous Infusions:  PRN Meds: acetaminophen, gi cocktail, hydrALAZINE, HYDROcodone-acetaminophen, morphine injection, nitroGLYCERIN, ondansetron (ZOFRAN) IV, zolpidem   Vital Signs    Vitals:   11/08/17 0440 11/08/17 1152 11/08/17 2025 11/09/17 0441  BP: (!) 147/52 (!) 163/56 (!) 153/60 (!) 143/56  Pulse: 72 73 75 65  Resp: 16 17 15 16   Temp: (!) 97.4 F (36.3 C) 98.4 F (36.9 C) 98.2 F (36.8 C) 98.1 F (36.7 C)  TempSrc: Oral Oral Oral Oral  SpO2: 99% 97% 98% 100%  Weight: 246 lb 4.1 oz (111.7 kg)   249 lb 12.5 oz (113.3 kg)  Height:        Intake/Output Summary (Last 24 hours) at 11/09/2017 1128 Last data filed at 11/08/2017 1340 Gross per 24 hour  Intake -  Output 900 ml  Net -900 ml   Filed Weights   11/07/17 1744 11/08/17 0440 11/09/17 0441  Weight: 249 lb 9 oz (113.2 kg) 246 lb 4.1 oz (111.7 kg) 249 lb 12.5 oz (113.3 kg)    Telemetry    NSR - Personally Reviewed  Physical Exam  Neck: No JVD, no carotid bruits Cardiac: RRR, no murmurs, rubs, or gallops.  Respiratory: Clear to auscultation bilaterally, no wheezes/ rales/ rhonchi GI: NABS, Soft, nontender, non-distended  MS: 1+ edema; Dressing over RLE wound C/D/I; s/p amputation of 1st and 5th L foot  toes Neuro:  Nonfocal, moving all extremities spontaneously Psych: Normal affect   Labs    Chemistry Recent Labs  Lab 11/06/17 1738 11/07/17 0445 11/08/17 0330 11/09/17 0222  NA 140 139 133* 136  K 4.0 3.7 4.0 3.8  CL 108 106 98* 100*  CO2 26 22 24 25   GLUCOSE 54* 145* 424* 231*  BUN 20 20 32* 44*  CREATININE 1.00 1.00 1.69* 1.94*  CALCIUM 9.3 9.0 8.3* 8.3*  PROT 7.6  --   --  7.1  ALBUMIN 3.2*  --   --  3.0*  AST 30  --   --  15  ALT 28  --   --  17  ALKPHOS 146*  --   --  138*  BILITOT 0.4  --   --  0.7  GFRNONAA >60 >60 33* 28*  GFRAA >60 >60 39* 33*  ANIONGAP 6 11 11 11      Hematology Recent Labs  Lab 11/06/17 1738 11/07/17 0445 11/09/17 0222  WBC 11.6* 12.8* 11.5*  RBC 3.84* 3.59* 3.55*  HGB 9.6* 9.0* 8.9*  HCT 31.1* 28.8* 28.2*  MCV 81.0 80.2 79.4  MCH 25.0* 25.1* 25.1*  MCHC 30.9 31.3 31.6  RDW 15.3 15.4 15.0  PLT 330 311 320    Cardiac Enzymes  Recent Labs  Lab 11/07/17 0122 11/07/17 0445  TROPONINI 0.05* 0.03*    Recent Labs  Lab 11/06/17 1742  TROPIPOC 0.05    BNP Recent Labs  Lab 11/06/17 1734  BNP 61.1    DDimer No results for input(s): DDIMER in the last 168 hours.   Radiology    No results found.  Cardiac Studies   Echocardiogram 11/07/17: Study Conclusions  - Left ventricle: The cavity size was normal. Wall thickness was   increased in a pattern of mild LVH. Systolic function was   vigorous. The estimated ejection fraction was in the range of 65%   to 70%. Wall motion was normal; there were no regional wall   motion abnormalities. Doppler parameters are consistent with   pseudonormal left ventricular relaxation (grade 2 diastolic   dysfunction). The E/e&' ratio is >20, suggesting markedly elevated   LV filling pressure. - Mitral valve: Calcified annulus. Mildly thickened leaflets .   There was trivial regurgitation. - Left atrium: The atrium was mildly dilated. - Inferior vena cava: The vessel was normal in size.  The   respirophasic diameter changes were in the normal range (>= 50%),   consistent with normal central venous pressure.  Impressions:  - LVEF 65-70%, mild LVH, normal wall motion, grade 2 DD, elevated   LV Filling pressure, trivial MR, mild LAE, normal IVC.  Right/Left heart catheterization 12/2016:  Conclusion  1. Normal right and left heart filling pressures.  2. Normal cardiac output.  3. Nonobstructive coronary disease, stents patent.   Continue current therapy.    Coronary Findings  Dominance: Right  Left Main  No significant disease.  Left Anterior Descending  30% ostial stenosis. 30-40% proximal stenosis. Patent mid LAD stent. 40-50% mid LAD stenosis distal to stent.  Left Circumflex  Large OM1. 30% mid LCx stenosis after OM1. Moderate OM2 with 30-40% proximal stenosis. 30% distal LCx stenosis.  Right Coronary Artery  Serial 30-40% stenoses in the proximal RCA. Long stented segment in the PDA was patent.    Patient Profile     57 y.o. female with PMH of obesity, chronic diastolic HF, DM, neuropathy, HTN, HLD, CAD with PCI to LAD and PDA (last LHC 12/2016 with patent stents), anemia, PAF (s/p ablation), PAD (s/p amputations of multiple toes with chronic LE wound), and OSA who presented with CP and was found to be volume overloaded.  Assessment & Plan    1. Acute on chronic diastolic CHF:  Still with DOE and some LE edema. Started on IV lasix 60mg  BID with -1.2L this admission, significant BUN/Crea increase, lasix on hold Echo this admission with EF 65-70%, no wall motion abnormalities, G2DD, and trivial MR.  - Continue to monitor strict I&Os and daily weights  2. Chest pain in patient with CAD s/p PCI to LAD and PDA: no complaints of CP since arrival to the ED. Trop negative x2. EKG non-ischemic. Suspect CP could be 2/2 volume overload. Echo this admission with stable EF and no wall motion abnormalities. - Ischemic testing deferred at this time. If CP reoccurs once  patient is diuresed, can consider cardiac catheterization  3. HTN: BP elevated  - increase imdur to 30 mg po daily  4. AKI: Cr 1.0>1.69->1.9 with IV diuresis - Monitor Cr closely with diuresis.  For questions or updates, please contact [place] Please consult [email or web address] for contact info under Cardiology/STEMI.   Ena Dawley, MD 11/09/2017

## 2017-11-09 NOTE — Progress Notes (Signed)
[facility] PROGRESS NOTE  Dana [name] LNL:[id number] DOB: 1961-06-10 DOA: 11/06/2017 PCP: [facility], [facility]   Summary - 57 yo female with PMH of obesity, diastolic HF, DM, neuropathy, HTN, HL, CAD ( cath 6/18 with patent stents), anemia, OSA and leg wound who presented with chest pain. She presented to the ED on 11/06/2017.  Reports the evening prior she had a sudden episode of chest pressure while getting up out of her recliner.EKG showed sinus rhythm with no acute ST/T wave abnormalities.  Chest x-ray showed cardiomegaly with pulmonary edema. Cardiology consulted and following    HPI/Subjective: Patient in bed, appears comfortable, denies any headache, no fever, no chest pain or pressure, no shortness of breath , no abdominal pain. No focal weakness.   Assessment/Plan:  Acute on chronic diastolic CHF:  With EF of 65%, has been diuresed well and currently no shortness of breath or oxygen demand, due to rising creatinine and worsening renal function further diuresis held, monitor with salt and fluid restriction.  Discussed with cardiology, continue Coreg for now.  ARF - likely due to overdiuresis, hold further Lasix, discontinue ARB, gentle hydration on 11/09/2017 and monitor renal function.  Hypertensive urgency (resolved) - currently on combination of Coreg and M. Doerr will continue to monitor and adjust as needed, PRN hydralazine ordered.Right lower extremity wound: Patient reports recently being discharged from wound care clinic.  Denies any change in appearance of wound.   Chest pain in patient with CAD s/p PCI to LAD and PDA: no complaints of CP since arrival to the ED. Trop negative x2. EKG non-ischemic. Suspect CP could be 2/2 volume overload. Echo did not show  wall motion abnormalities.  Symptom-free now no further work-up per cardiology.  Normocytic  anemia:  Anemia of chronic disease stable no acute issues.  History of paroxysmal atrial fibrillation status post  ablation - on Coreg continue currently on Plavix and no full anticoagulation, defer this to cardiology. In NSR now, advised to score of at least 2.  Morbid obesity: BMI 46.8 -follow with PCP for weight loss.  Hypoglycemia due to insulin, history of uncontrolled type 1 diabetes: Acute.  Initial blood glucose was started to be as low as 36 on admission.  At least stable on present regimen we will continue to monitor closely.  CBG (last 3)  Recent Labs    11/08/17 1639 11/08/17 2053 11/09/17 0610  GLUCAP 266* 296* 165*       DVT prophylaxis: Lovenox Code Status: Full Family Communication: No family present at bedside Disposition Plan: Likely discharge home once renal fn stabilizes and plan is clear   Consults called: cards    Objective: Vitals:   11/08/17 2025 11/09/17 0441  BP: (!) 153/60 (!) 143/56  Pulse: 75 65  Resp: 15 16  Temp: 98.2 F (36.8 C) 98.1 F (36.7 C)  SpO2: 98% 100%    Intake/Output Summary (Last 24 hours) at 11/09/2017 1248 Last data filed at 11/08/2017 1340 Gross per 24 hour  Intake -  Output 900 ml  Net -900 ml   Filed Weights   11/07/17 1744 11/08/17 0440 11/09/17 0441  Weight: 113.2 kg (249 lb 9 oz) 111.7 kg (246 lb 4.1 oz) 113.3 kg (249 lb 12.5 oz)    Exam:  Awake Alert, Oriented X 3, No new F.N deficits, Flat affect [place].AT,PERRAL Supple Neck,No JVD, No cervical lymphadenopathy appriciated.  Symmetrical Chest wall movement, Good air movement bilaterally, CTAB RRR,No Gallops, Rubs or new Murmurs, No Parasternal Heave +ve B.Sounds,  Abd Soft, No tenderness, No organomegaly appriciated, No rebound - guarding or rigidity. No Cyanosis, Clubbing or edema, No new Rash or bruise  Data Reviewed: Basic Metabolic Panel: Recent Labs  Lab 11/06/17 1738 11/07/17 0445 11/08/17 0330 11/09/17 0222  NA 140 139 133* 136  K 4.0 3.7 4.0 3.8  CL 108 106 98* 100*  CO2 26 22 24 25   GLUCOSE 54* 145* 424* 231*  BUN 20 20 32* 44*  CREATININE 1.00 1.00  1.69* 1.94*  CALCIUM 9.3 9.0 8.3* 8.3*   Liver Function Tests: Recent Labs  Lab 11/06/17 1738 11/09/17 0222  AST 30 15  ALT 28 17  ALKPHOS 146* 138*  BILITOT 0.4 0.7  PROT 7.6 7.1  ALBUMIN 3.2* 3.0*   Recent Labs  Lab 11/06/17 1738  LIPASE 23   No results for input(s): AMMONIA in the last 168 hours. CBC: Recent Labs  Lab 11/06/17 1738 11/07/17 0445 11/09/17 0222  WBC 11.6* 12.8* 11.5*  NEUTROABS  --  9.7*  --   HGB 9.6* 9.0* 8.9*  HCT 31.1* 28.8* 28.2*  MCV 81.0 80.2 79.4  PLT 330 311 320   Cardiac Enzymes: Recent Labs  Lab 11/07/17 0122 11/07/17 0445  TROPONINI 0.05* 0.03*   BNP (last 3 results) Recent Labs    01/31/17 1503 11/06/17 1734  BNP 23.2 61.1    ProBNP (last 3 results) No results for input(s): PROBNP in the last 8760 hours.  CBG: Recent Labs  Lab 11/08/17 1154 11/08/17 1332 11/08/17 1639 11/08/17 2053 11/09/17 0610  GLUCAP 311* 284* 266* 296* 165*    No results found for this or any previous visit (from the past 240 hour(s)).   Studies: No results found.  Scheduled Meds: . carvedilol  6.25 mg Oral BID WC  . clopidogrel  75 mg Oral QHS  . enoxaparin (LOVENOX) injection  40 mg Subcutaneous Daily  . ferrous sulfate  325 mg Oral BID WC  . insulin aspart  0-15 Units Subcutaneous TID WC  . insulin aspart  0-5 Units Subcutaneous QHS  . insulin aspart  4 Units Subcutaneous TID WC  . insulin NPH Human  22 Units Subcutaneous BID AC & HS  . isosorbide mononitrate  15 mg Oral Once  . [START ON 11/10/2017] isosorbide mononitrate  30 mg Oral Daily  . latanoprost  1 drop Both Eyes QHS  . rosuvastatin  40 mg Oral q1800   Continuous Infusions: . sodium chloride      Principal Problem:   Chest pain Active Problems:   Uncontrolled type 1 diabetes mellitus with diabetic polyneuropathy (HCC)   Hypoglycemia due to insulin   Diastolic heart failure (HCC)   Hypertensive urgency   Obesity, Class III, BMI 40-49.9 (morbid obesity) ([place])    Demand ischemia ([place])    Time spent: 45  Signature  Lala Lund M.D on 11/09/2017 at 12:48 PM  Between 7am to 7pm - Pager - [phone] ( page via [email or web address], text pages only, please mention full 10 digit call back number).  After 7pm go to [email or web address] - password [facility]

## 2017-11-10 LAB — BASIC METABOLIC PANEL
Anion gap: 8 (ref 5–15)
BUN: 49 mg/dL — ABNORMAL HIGH (ref 6–20)
CO2: 26 mmol/L (ref 22–32)
Calcium: 8.3 mg/dL — ABNORMAL LOW (ref 8.9–10.3)
Chloride: 104 mmol/L (ref 101–111)
Creatinine, Ser: 2.16 mg/dL — ABNORMAL HIGH (ref 0.44–1.00)
GFR calc Af Amer: 29 mL/min — ABNORMAL LOW (ref >60)
GFR calc non Af Amer: 25 mL/min — ABNORMAL LOW (ref >60)
Glucose, Bld: 120 mg/dL — ABNORMAL HIGH (ref 65–99)
Potassium: 3.9 mmol/L (ref 3.5–5.1)
Sodium: 138 mmol/L (ref 135–145)

## 2017-11-10 LAB — GLUCOSE, CAPILLARY
Glucose-Capillary: 127 mg/dL — ABNORMAL HIGH (ref 65–99)
Glucose-Capillary: 182 mg/dL — ABNORMAL HIGH (ref 65–99)
Glucose-Capillary: 123 mg/dL — ABNORMAL HIGH (ref 65–99)
Glucose-Capillary: 131 mg/dL — ABNORMAL HIGH (ref 65–99)

## 2017-11-10 MED ORDER — LACTATED RINGERS IV SOLN
INTRAVENOUS | Status: AC
  Administered 2017-11-10: 09:00:00 via INTRAVENOUS

## 2017-11-10 NOTE — Progress Notes (Signed)
Patient stated that she will put on the CPAP around 2200 and she doesn't need any assistance with applying the mask.  RT advised to call if there is any assistance needed. RT checked water chamber; it's full and the CPAP is plugged into a red outlet.

## 2017-11-10 NOTE — Progress Notes (Signed)
[facility] PROGRESS NOTE  Dana [name] SWN:[id number] DOB: [birth year] DOA: 11/06/2017 PCP: [facility]   Summary - 57 yo female with PMH of obesity, diastolic HF, DM, neuropathy, HTN, HL, CAD ( cath 6/18 with patent stents), anemia, OSA and leg wound who presented with chest pain. She presented to the ED on 11/06/2017.  Reports the evening prior she had a sudden episode of chest pressure while getting up out of her recliner.EKG showed sinus rhythm with no acute ST/T wave abnormalities.  Chest x-ray showed cardiomegaly with pulmonary edema. Cardiology consulted and following    HPI/Subjective: Patient in bed, appears comfortable, denies any headache, no fever, no chest pain or pressure, no shortness of breath , no abdominal pain. No focal weakness.   Assessment/Plan:  Acute on chronic diastolic CHF:  With EF of 65%, has been diuresed well and currently no shortness of breath or oxygen demand, due to rising creatinine and worsening renal function further diuresis held, monitor with salt and fluid restriction.  Discussed with cardiology, continue Coreg for now.  ARF -further diuresis has been held, ARB has been discontinued, will hydrate again on 11/10/2017 and monitor renal function, good urine output.  Hypertensive urgency (resolved) - currently on combination of Coreg and M. Doerr will continue to monitor and adjust as needed, PRN hydralazine ordered.Right lower extremity wound: Patient reports recently being discharged from wound care clinic.  Denies any change in appearance of wound.   Chest pain in patient with CAD s/p PCI to LAD and PDA: no complaints of CP since arrival to the ED. Trop negative x2. EKG non-ischemic. Suspect CP could be 2/2 volume overload. Echo did not show  wall motion abnormalities.  Symptom-free now no further work-up per cardiology.  Normocytic  anemia:  Anemia of chronic disease stable no acute issues.  History of paroxysmal atrial fibrillation  status post ablation - on Coreg continue currently on Plavix and no full anticoagulation, defer this to cardiology. In NSR now, advised to score of at least 2.  Morbid obesity: BMI 46.8 -follow with PCP for weight loss.  Hypoglycemia due to insulin, history of uncontrolled type 1 diabetes: Acute.  Initial blood glucose was started to be as low as 36 on admission.  At least stable on present regimen we will continue to monitor closely.  CBG (last 3)  Recent Labs    11/09/17 2139 11/10/17 0555 11/10/17 1227  GLUCAP 192* 127* 182*       DVT prophylaxis: Lovenox Code Status: Full Family Communication: No family present at bedside Disposition Plan:  Discharge home once renal function stabilizes  Consults called: cards    Objective: Vitals:   11/09/17 2048 11/10/17 0340  BP: (!) 134/47 127/62  Pulse: 73 78  Resp: 18 19  Temp: 98.1 F (36.7 C) 98.5 F (36.9 C)  SpO2: 96% 98%    Intake/Output Summary (Last 24 hours) at 11/10/2017 1232 Last data filed at 11/10/2017 0344 Gross per 24 hour  Intake -  Output 500 ml  Net -500 ml   Filed Weights   11/08/17 0440 11/09/17 0441 11/10/17 0340  Weight: 111.7 kg (246 lb 4.1 oz) 113.3 kg (249 lb 12.5 oz) 112.7 kg (248 lb 8 oz)    Exam:  Awake Alert, Oriented X 3, No new F.N deficits, Normal affect [name],PERRAL Supple Neck,No JVD, No cervical lymphadenopathy appriciated.  Symmetrical Chest wall movement, Good air movement bilaterally, CTAB RRR,No Gallops, Rubs or new Murmurs, No Parasternal Heave +ve B.Sounds, Abd Soft, No  tenderness, No organomegaly appriciated, No rebound - guarding or rigidity. No Cyanosis, Clubbing or edema, No new Rash or bruise  Data Reviewed: Basic Metabolic Panel: Recent Labs  Lab 11/06/17 1738 11/07/17 0445 11/08/17 0330 11/09/17 0222 11/10/17 0310  NA 140 139 133* 136 138  K 4.0 3.7 4.0 3.8 3.9  CL 108 106 98* 100* 104  CO2 26 22 24 25 26   GLUCOSE 54* 145* 424* 231* 120*  BUN 20 20 32* 44*  49*  CREATININE 1.00 1.00 1.69* 1.94* 2.16*  CALCIUM 9.3 9.0 8.3* 8.3* 8.3*   Liver Function Tests: Recent Labs  Lab 11/06/17 1738 11/09/17 0222  AST 30 15  ALT 28 17  ALKPHOS 146* 138*  BILITOT 0.4 0.7  PROT 7.6 7.1  ALBUMIN 3.2* 3.0*   Recent Labs  Lab 11/06/17 1738  LIPASE 23   No results for input(s): AMMONIA in the last 168 hours. CBC: Recent Labs  Lab 11/06/17 1738 11/07/17 0445 11/09/17 0222  WBC 11.6* 12.8* 11.5*  NEUTROABS  --  9.7*  --   HGB 9.6* 9.0* 8.9*  HCT 31.1* 28.8* 28.2*  MCV 81.0 80.2 79.4  PLT 330 311 320   Cardiac Enzymes: Recent Labs  Lab 11/07/17 0122 11/07/17 0445  TROPONINI 0.05* 0.03*   BNP (last 3 results) Recent Labs    01/31/17 1503 11/06/17 1734  BNP 23.2 61.1    ProBNP (last 3 results) No results for input(s): PROBNP in the last 8760 hours.  CBG: Recent Labs  Lab 11/09/17 1212 11/09/17 1745 11/09/17 2139 11/10/17 0555 11/10/17 1227  GLUCAP 164* 283* 192* 127* 182*    No results found for this or any previous visit (from the past 240 hour(s)).   Studies: No results found.  Scheduled Meds: . carvedilol  6.25 mg Oral BID WC  . clopidogrel  75 mg Oral QHS  . enoxaparin (LOVENOX) injection  40 mg Subcutaneous Daily  . ferrous sulfate  325 mg Oral BID WC  . insulin aspart  0-15 Units Subcutaneous TID WC  . insulin aspart  0-5 Units Subcutaneous QHS  . insulin aspart  4 Units Subcutaneous TID WC  . insulin NPH Human  22 Units Subcutaneous BID AC & HS  . isosorbide mononitrate  30 mg Oral Daily  . latanoprost  1 drop Both Eyes QHS  . rosuvastatin  40 mg Oral q1800   Continuous Infusions: . lactated ringers 100 mL/hr at 11/10/17 1052    Principal Problem:   Chest pain Active Problems:   Uncontrolled type 1 diabetes mellitus with diabetic polyneuropathy (HCC)   Hypoglycemia due to insulin   Diastolic heart failure (HCC)   Hypertensive urgency   Obesity, Class III, BMI 40-49.9 (morbid obesity) ([place])    Demand ischemia ([place])    Time spent: 45  Signature  Lala Lund M.D on 11/10/2017 at 12:32 PM  Between 7am to 7pm - Pager - [phone] ( page via [email or web address], text pages only, please mention full 10 digit call back number).  After 7pm go to [email or web address] - password [email or web address]

## 2017-11-11 DIAGNOSIS — E1042 Type 1 diabetes mellitus with diabetic polyneuropathy: Secondary | ICD-10-CM

## 2017-11-11 DIAGNOSIS — E1065 Type 1 diabetes mellitus with hyperglycemia: Secondary | ICD-10-CM

## 2017-11-11 DIAGNOSIS — I5032 Chronic diastolic (congestive) heart failure: Secondary | ICD-10-CM

## 2017-11-11 LAB — BASIC METABOLIC PANEL
Anion gap: 7 (ref 5–15)
BUN: 42 mg/dL — ABNORMAL HIGH (ref 6–20)
CO2: 26 mmol/L (ref 22–32)
Calcium: 8.5 mg/dL — ABNORMAL LOW (ref 8.9–10.3)
Chloride: 106 mmol/L (ref 101–111)
Creatinine, Ser: 1.36 mg/dL — ABNORMAL HIGH (ref 0.44–1.00)
GFR calc Af Amer: 50 mL/min — ABNORMAL LOW (ref >60)
GFR calc non Af Amer: 43 mL/min — ABNORMAL LOW (ref >60)
Glucose, Bld: 106 mg/dL — ABNORMAL HIGH (ref 65–99)
Potassium: 3.7 mmol/L (ref 3.5–5.1)
Sodium: 139 mmol/L (ref 135–145)

## 2017-11-11 LAB — GLUCOSE, CAPILLARY
Glucose-Capillary: 89 mg/dL (ref 65–99)
Glucose-Capillary: 193 mg/dL — ABNORMAL HIGH (ref 65–99)

## 2017-11-11 LAB — MAGNESIUM: Magnesium: 2 mg/dL (ref 1.7–2.4)

## 2017-11-11 MED ORDER — TORSEMIDE 20 MG PO TABS: 60.0000 mg | ORAL_TABLET | Freq: Every day | ORAL | Status: DC

## 2017-11-11 NOTE — Discharge Instructions (Signed)
Follow with Primary MD [facility] in 7 days   Get CBC, CMP, 2 view Chest X ray checked  by Primary MD in 5-7 days   Activity: As tolerated with Full fall precautions use walker/cane & assistance as needed  Disposition Home   Diet:  Heart Healthy  Low Carb. Check your Weight same time everyday, if you gain over 2 pounds, or you develop in leg swelling, experience more shortness of breath or chest pain, call your Primary MD immediately. Follow Cardiac Low Salt Diet and 1.5 lit/day fluid restriction.  Accuchecks 4 times/day, Once in AM empty stomach and then before each meal. Log in all results and show them to your Prim.MD in 3 days. If any glucose reading is under 80 or above 300 call your Prim MD immidiately. Follow Low glucose instructions for glucose under 80 as instructed.  Special Instructions: If you have smoked or chewed Tobacco  in the last 2 yrs please stop smoking, stop any regular Alcohol  and or any Recreational drug use.  On your next visit with your primary care physician please Get Medicines reviewed and adjusted.  Please request your Prim.MD to go over all Hospital Tests and Procedure/Radiological results at the follow up, please get all Hospital records sent to your Prim MD by signing hospital release before you go home.  If you experience worsening of your admission symptoms, develop shortness of breath, life threatening emergency, suicidal or homicidal thoughts you must seek medical attention immediately by calling 911 or calling your MD immediately  if symptoms less severe.  You Must read complete instructions/literature along with all the possible adverse reactions/side effects for all the Medicines you take and that have been prescribed to you. Take any new Medicines after you have completely understood and accpet all the possible adverse reactions/side effects.

## 2017-11-11 NOTE — Discharge Summary (Signed)
SHELITHA MAGLEY LFY:[id number] DOB: 03-30-1961 DOA: 11/06/2017  PCP: [facility]  Admit date: 11/06/2017  Discharge date: 11/11/2017  Admitted From: Home  Disposition:  Home   Recommendations for Outpatient Follow-up:   Follow up with PCP in 1-2 weeks  PCP Please obtain BMP/CBC, 2 view CXR in 1week,  (see Discharge instructions)   PCP Please follow up on the following pending results: Weight, BMP and diuretic dose closely along with glycemic control.   Home Health: None Equipment/Devices: None Consultations: Cards Discharge Condition: Fair CODE STATUS: Full   Diet Recommendation: Heart Healthy, low carbohydrate   Chief Complaint  Patient presents with  . Chest Pain     Brief history of present illness from the day of admission and additional interim summary     57 yo female with PMH of obesity, diastolic HF, DM, neuropathy, HTN, HL, CAD ( cath 6/18 with patent stents), anemia, OSA and leg wound who presented with chest pain.She presented to the ED on 11/06/2017. Reports the evening prior she had a sudden episode of chest pressure while getting up out of her recliner.EKG showed sinus rhythm with no acute ST/T wave abnormalities. Chest x-ray showed cardiomegaly with pulmonary edema. Cardiology consulted and following                                                                     Hospital Course    Acute on chronic diastolic CHF: With EF of 85%, has been diuresed well and currently no shortness of breath or oxygen demand, due to rising creatinine and worsening renal function further diuresis held, Coreg and nitroglycerin, case discussed with Cards Dr Warren Lacy, she will be discharged on home dose Coreg, Aldactone, nitroglycerin, half of home dose Demadex at once a day, hold ARB since EF is  preserved in she had problems with renal dysfunction this admission.  Emphasized on salt and fluid restriction.  Will follow with PCP and her primary cardiologist within a week.  Please monitor BMP, diuretic dose closely.  May require increase in diuretic dose soon. .  ARF -overdiuresis, ARB discontinued, diuretics were held and she was gently hydrated.  ARF has almost resolved.  Kindly see plan above.  Hypertensive urgency (resolved) - currently on combination of Coreg and Imdur diuretics as above, PCP to monitor and adjust as needed.   Chest pain in patient with CAD s/p PCI to LAD and PDA:no complaints of CP since arrival to the ED. Trop negative x2. EKG non-ischemic. Suspect CP could be 2/2 volume overload. Echo did not show  wall motion abnormalities.  Symptom-free now no further work-up per cardiology.  Normocytic  anemia: Anemia of chronic disease stable no acute issues.  History of paroxysmal atrial fibrillation status post ablation - on Coreg continue currently on Plavix  and no full anticoagulation, defer this to cardiology. In NSR now, advised to score of at least 2.  Morbid obesity:BMI 46.8 -follow with PCP for weight loss.  Hypoglycemiadue to insulin, history of uncontrolled type 1 diabetes:Dietary compliance at home, sugars are stable continue home regimen, requested to check CBGs q. before meals at bedtime and maintain logbook, showed logbook to PCP next visit.  CBG (last 3)  Recent Labs    11/10/17 1710 11/10/17 2117 11/11/17 0614  GLUCAP 123* 131* 89    Discharge diagnosis     Principal Problem:   Chest pain Active Problems:   Uncontrolled type 1 diabetes mellitus with diabetic polyneuropathy (HCC)   Hypoglycemia due to insulin   Diastolic heart failure (HCC)   Hypertensive urgency   Obesity, Class III, BMI 40-49.9 (morbid obesity) ([place])   Demand ischemia [facility])    Discharge instructions    Discharge Instructions    Discharge instructions    Complete by:  As directed    Follow with Primary MD [facility] in 7 days   Get CBC, CMP, 2 view Chest X ray checked  by Primary MD in 5-7 days   Activity: As tolerated with Full fall precautions use walker/cane & assistance as needed  Disposition Home   Diet:  Heart Healthy  Low Carb. Check your Weight same time everyday, if you gain over 2 pounds, or you develop in leg swelling, experience more shortness of breath or chest pain, call your Primary MD immediately. Follow Cardiac Low Salt Diet and 1.5 lit/day fluid restriction.  Accuchecks 4 times/day, Once in AM empty stomach and then before each meal. Log in all results and show them to your Prim.MD in 3 days. If any glucose reading is under 80 or above 300 call your Prim MD immidiately. Follow Low glucose instructions for glucose under 80 as instructed.  Special Instructions: If you have smoked or chewed Tobacco  in the last 2 yrs please stop smoking, stop any regular Alcohol  and or any Recreational drug use.  On your next visit with your primary care physician please Get Medicines reviewed and adjusted.  Please request your Prim.MD to go over all Hospital Tests and Procedure/Radiological results at the follow up, please get all Hospital records sent to your Prim MD by signing hospital release before you go home.  If you experience worsening of your admission symptoms, develop shortness of breath, life threatening emergency, suicidal or homicidal thoughts you must seek medical attention immediately by calling 911 or calling your MD immediately  if symptoms less severe.  You Must read complete instructions/literature along with all the possible adverse reactions/side effects for all the Medicines you take and that have been prescribed to you. Take any new Medicines after you have completely understood and accpet all the possible adverse reactions/side effects.   Increase activity slowly   Complete by:  As directed        Discharge Medications   Allergies as of 11/11/2017      Reactions   Naproxen Hives, Rash   Celecoxib Other (See Comments)   Stomach pain   Aspirin Other (See Comments)   CAN NOT TAKE DUE TO ULCERS   Glucosamine Hives, Swelling, Other (See Comments)   Angioedema   Metoclopramide Other (See Comments)   Facial twitching and stuttering   Metolazone Other (See Comments)   Acute renal failure   Shellfish-derived Products Hives, Swelling, Other (See Comments)   Angioedema   Tramadol Nausea And Vomiting, Other (See  Comments)         Medication List    STOP taking these medications   losartan 25 MG tablet Commonly known as:  COZAAR     TAKE these medications   albuterol 108 (90 Base) MCG/ACT inhaler Commonly known as:  PROVENTIL HFA;VENTOLIN HFA Inhale 2 puffs into the lungs every 6 (six) hours as needed for wheezing or shortness of breath.   albuterol 0.63 MG/3ML nebulizer solution Commonly known as:  ACCUNEB Inhale 1 ampule by nebulization every six (6) hours as needed for wheezing.   carvedilol 6.25 MG tablet Commonly known as:  COREG Take 1 tablet (6.25 mg total) by mouth 2 (two) times daily.   clopidogrel 75 MG tablet Commonly known as:  PLAVIX Take 75 mg by mouth at bedtime.   eszopiclone 2 MG Tabs tablet Commonly known as:  LUNESTA Take 2 mg by mouth at bedtime.   ferrous sulfate 325 (65 FE) MG tablet Take 325 mg by mouth 2 (two) times daily with a meal.   FISH OIL PO Take 1 capsule by mouth daily.   GLUCAGON EMERGENCY 1 MG injection Generic drug:  glucagon Inject 1 mg into the muscle once as needed (as directed).   HYDROcodone-acetaminophen 7.5-325 MG tablet Commonly known as:  NORCO Take 1 tablet by mouth 2 (two) times daily.   insulin NPH Human 100 UNIT/ML injection Commonly known as:  HUMULIN N,NOVOLIN N Inject 30 units into the skin two times a day- before breakfast and supper/MDD=60 units   insulin regular 100 units/mL injection Commonly  known as:  NOVOLIN R,HUMULIN R Inject 7-15 Units into the skin 3 (three) times daily before meals.   isosorbide mononitrate 30 MG 24 hr tablet Commonly known as:  IMDUR Take 0.5 tablets (15 mg total) by mouth daily.   latanoprost 0.005 % ophthalmic solution Commonly known as:  XALATAN Place 1 drop into both eyes at bedtime.   LINZESS 145 MCG Caps capsule Generic drug:  linaclotide Take 145 mcg by mouth daily as needed (to accelerate bowel movements).   metolazone 2.5 MG tablet Commonly known as:  ZAROXOLYN Take 1 tablet (2.5 mg total) by mouth once a week. Take Every Wednesday What changed:    when to take this  additional instructions   nitroGLYCERIN 0.4 MG SL tablet Commonly known as:  NITROSTAT Place 0.4 mg under the tongue every 5 (five) minutes as needed for chest pain.   OXYGEN Inhale 2 L into the lungs at bedtime.   pravastatin 40 MG tablet Commonly known as:  PRAVACHOL Take 80 mg by mouth at bedtime.   Probiotic Caps Take 1 capsule by mouth daily.   promethazine 25 MG tablet Commonly known as:  PHENERGAN Take 25 mg by mouth every 6 (six) hours as needed for nausea or vomiting.   spironolactone 25 MG tablet Commonly known as:  ALDACTONE Take 0.5 tablets (12.5 mg total) by mouth daily.   torsemide 20 MG tablet Commonly known as:  DEMADEX Take 3 tablets (60 mg total) by mouth daily. What changed:  when to take this       Follow-up Information    [facility]. Schedule an appointment as soon as possible for a visit in 1 month(s).   Contact information: [address] [place] [address] [phone]        Bensimhon, Shaune Pascal, MD. Schedule an appointment as soon as possible for a visit in 1 week(s).   Specialty:  Cardiology Contact information: [address] [place] [address] [phone]           Major procedures and Radiology Reports - PLEASE review detailed and final reports thoroughly  -          Dg Chest 2 View  Result Date: 11/06/2017 CLINICAL DATA:  Weakness today. Headache and blurred vision. Chest tightness beginning at 5:15 p.m. EXAM: CHEST - 2 VIEW COMPARISON:  Single-view of the chest 11/02/2016 02/19/2017. FINDINGS: There is cardiomegaly and interstitial pulmonary edema. No consolidative process, pneumothorax or effusion. Aortic atherosclerosis is noted. No acute bony abnormality. IMPRESSION: Cardiomegaly and interstitial edema. Electronically Signed   By: Inge Rise M.D.   On: 11/06/2017 18:31    Micro Results     No results found for this or any previous visit (from the past 240 hour(s)).  Today   Subjective    Armanie Ullmer today has no headache,no chest abdominal pain,no new weakness tingling or numbness, feels much better wants to go home today.    Objective   Blood pressure 140/60, pulse 75, temperature 98.5 F (36.9 C), temperature source Oral, resp. rate 20, height 5\' 4"  (1.626 m), weight 113.2 kg (249 lb 8 oz), SpO2 98 %.   Intake/Output Summary (Last 24 hours) at 11/11/2017 1129 Last data filed at 11/10/2017 2044 Gross per 24 hour  Intake -  Output 900 ml  Net -900 ml    Exam Awake Alert, Oriented x 3, No new F.N deficits, Normal affect [place].AT,PERRAL Supple Neck,No JVD, No cervical lymphadenopathy appriciated.  Symmetrical Chest wall movement, Good air movement bilaterally, CTAB RRR,No Gallops,Rubs or new Murmurs, No Parasternal Heave +ve B.Sounds, Abd Soft, Non tender, No organomegaly appriciated, No rebound -guarding or rigidity. No Cyanosis, Clubbing or edema, No new Rash or bruise   Data Review   CBC w Diff:  Lab Results  Component Value Date   WBC 11.5 (H) 11/09/2017   HGB 8.9 (L) 11/09/2017   HCT 28.2 (L) 11/09/2017   PLT 320 11/09/2017   LYMPHOPCT 17 11/07/2017   MONOPCT 6 11/07/2017   EOSPCT 1 11/07/2017   BASOPCT 0 11/07/2017    CMP:  Lab Results  Component Value Date   NA 139 11/11/2017   K 3.7 11/11/2017    CL 106 11/11/2017   CO2 26 11/11/2017   BUN 42 (H) 11/11/2017   CREATININE 1.36 (H) 11/11/2017   PROT 7.1 11/09/2017   ALBUMIN 3.0 (L) 11/09/2017   BILITOT 0.7 11/09/2017   ALKPHOS 138 (H) 11/09/2017   AST 15 11/09/2017   ALT 17 11/09/2017  .   Total Time in preparing paper work, data evaluation and todays exam - 10 minutes  Lala Lund M.D on 11/11/2017 at 11:29 AM  [facility]   Office  [phone]

## 2017-11-11 NOTE — Care Management Important Message (Signed)
Important Message  Patient Details  Name: Dana [name] MRN: [id number] Date of Birth: 29-Apr-1961   Medicare Important Message Given:  Yes    Syla Devoss P Kimika Streater 11/11/2017, 3:03 PM

## 2017-11-11 NOTE — Progress Notes (Signed)
Patient given discharge instructions, medication list and follow up appointments given to patient. IV and tele were dcd. Will discharge home as ordered all questions answered will discharge home as ordered. Lidie Glade, Bettina Gavia RN

## 2017-11-11 NOTE — Progress Notes (Addendum)
Progress Note  Patient Name: Dana [name] Date of Encounter: 11/11/2017  Primary Cardiologist:   No primary care provider on file.   Subjective   She feels better and is ready to go home.  No pain.  Her breathing is at baseline.  Her weight is at baseline.   Inpatient Medications    Scheduled Meds: . carvedilol  6.25 mg Oral BID WC  . clopidogrel  75 mg Oral QHS  . enoxaparin (LOVENOX) injection  40 mg Subcutaneous Daily  . ferrous sulfate  325 mg Oral BID WC  . insulin aspart  0-15 Units Subcutaneous TID WC  . insulin aspart  0-5 Units Subcutaneous QHS  . insulin aspart  4 Units Subcutaneous TID WC  . insulin NPH Human  22 Units Subcutaneous BID AC & HS  . isosorbide mononitrate  30 mg Oral Daily  . latanoprost  1 drop Both Eyes QHS  . rosuvastatin  40 mg Oral q1800   Continuous Infusions:  PRN Meds: acetaminophen, gi cocktail, hydrALAZINE, HYDROcodone-acetaminophen, morphine injection, nitroGLYCERIN, ondansetron (ZOFRAN) IV, zolpidem   Vital Signs    Vitals:   11/10/17 1613 11/10/17 2043 11/10/17 2055 11/11/17 0423  BP: (!) 127/58 (!) 157/65  140/60  Pulse:  67  75  Resp: 18 17 18 20   Temp:  98.2 F (36.8 C)  98.5 F (36.9 C)  TempSrc:  Oral  Oral  SpO2: 100% 100%  98%  Weight:    249 lb 8 oz (113.2 kg)  Height:        Intake/Output Summary (Last 24 hours) at 11/11/2017 0843 Last data filed at 11/10/2017 2044 Gross per 24 hour  Intake -  Output 900 ml  Net -900 ml   Filed Weights   11/09/17 0441 11/10/17 0340 11/11/17 0423  Weight: 249 lb 12.5 oz (113.3 kg) 248 lb 8 oz (112.7 kg) 249 lb 8 oz (113.2 kg)    Telemetry    NSR ,wth atrial tach brief runs  - Personally Reviewed  ECG    NA - Personally Reviewed  Physical Exam   GEN: No acute distress.   Neck: No  JVD Cardiac: RRR, no murmurs, rubs, or gallops.  Respiratory: Clear  to auscultation bilaterally. GI: Soft, nontender, non-distended  MS: Trace edema; No deformity. Neuro:  Nonfocal    Psych: Normal affect   Labs    Chemistry Recent Labs  Lab 11/06/17 1738  11/09/17 0222 11/10/17 0310 11/11/17 0254  NA 140   < > 136 138 139  K 4.0   < > 3.8 3.9 3.7  CL 108   < > 100* 104 106  CO2 26   < > 25 26 26   GLUCOSE 54*   < > 231* 120* 106*  BUN 20   < > 44* 49* 42*  CREATININE 1.00   < > 1.94* 2.16* 1.36*  CALCIUM 9.3   < > 8.3* 8.3* 8.5*  PROT 7.6  --  7.1  --   --   ALBUMIN 3.2*  --  3.0*  --   --   AST 30  --  15  --   --   ALT 28  --  17  --   --   ALKPHOS 146*  --  138*  --   --   BILITOT 0.4  --  0.7  --   --   GFRNONAA >60   < > 28* 25* 43*  GFRAA >60   < > 33* 29* 50*  ANIONGAP 6   < > 11 8 7    < > = values in this interval not displayed.     Hematology Recent Labs  Lab 11/06/17 1738 11/07/17 0445 11/09/17 0222  WBC 11.6* 12.8* 11.5*  RBC 3.84* 3.59* 3.55*  HGB 9.6* 9.0* 8.9*  HCT 31.1* 28.8* 28.2*  MCV 81.0 80.2 79.4  MCH 25.0* 25.1* 25.1*  MCHC 30.9 31.3 31.6  RDW 15.3 15.4 15.0  PLT 330 311 320    Cardiac Enzymes Recent Labs  Lab 11/07/17 0122 11/07/17 0445  TROPONINI 0.05* 0.03*    Recent Labs  Lab 11/06/17 1742  TROPIPOC 0.05     BNP Recent Labs  Lab 11/06/17 1734  BNP 61.1     DDimer No results for input(s): DDIMER in the last 168 hours.   Radiology    No results found.  Cardiac Studies   Echocardiogram 11/07/17: Study Conclusions  - Left ventricle: The cavity size was normal. Wall thickness was increased in a pattern of mild LVH. Systolic function was vigorous. The estimated ejection fraction was in the range of 65% to 70%. Wall motion was normal; there were no regional wall motion abnormalities. Doppler parameters are consistent with pseudonormal left ventricular relaxation (grade 2 diastolic dysfunction). The E/e&' ratio is >20, suggesting markedly elevated LV filling pressure. - Mitral valve: Calcified annulus. Mildly thickened leaflets . There was trivial regurgitation. - Left  atrium: The atrium was mildly dilated. - Inferior vena cava: The vessel was normal in size. The respirophasic diameter changes were in the normal range (>= 50%), consistent with normal central venous pressure.  Impressions:  - LVEF 65-70%, mild LVH, normal wall motion, grade 2 DD, elevated LV Filling pressure, trivial MR, mild LAE, normal IVC.    Patient Profile     57 y.o. female with PMH of obesity, chronic diastolic HF, DM, neuropathy, HTN, HLD, CAD with PCI to LAD and PDA (last LHC 12/2016 with patent stents), anemia, PAF (s/p ablation), PAD (s/p amputations of multiple toes with chronic LE wound), and OSA who presented with CP and was found to be volume overloaded.  Assessment & Plan    ACUTE ON CHRONIC DIASTOLIC HF:    Down 3 liters since admit and weight down to baseline.  I would suggest restarting diuretic at 60 mg once daily Torsemide and then we will follow up in clinic later this week with BMET.  Hold on ARB at discharge but we can readdress this at a HF TOC appt this week.   CHEST PAIN:    No further cardiac work up.    HTN:  BP is slightly elevated.   AKI:  Creat increased and diuretic held.  ARB held.   Creat is much improved now.   Plans for meds as above. We are arranging follow up in the clinic later this week.    For questions or updates, please contact [place] Please consult [email or web address] for contact info under Cardiology/STEMI.   Signed, Minus Breeding, MD  11/11/2017, 8:43 AM

## 2017-11-11 NOTE — Consult Note (Signed)
            [facility] CM Primary Care Navigator  11/11/2017  Dana [name] 1961/03/08 [id number]   Wenttoseepatient at the bedsideto identify possible discharge needs but she was alreadydischargedper staff report.  PerMD note, patient was seen for sudden episode of chest pressure/ pain while getting up. Chest x-ray showed cardiomegaly with pulmonary edema and cardiology was consulted.  Patient was discharged home today.  Primary care provider's officeis listed asprovidingtransition of care (TOC). Patient hasdischarge instruction to follow-up withprimary care provider (Dr. [name]) in 1- 2 weeks.  Primary care provider's office called Hoyle Sauer) to notify of patient's health issues needing follow-up (mainly HF and DM) and made aware to refer patient to [facility] care management if deemed necessary and appropriate for services.   For additional questions please contact:  Edwena Felty A. Victormanuel Mclure, BSN, RN-BC [facility] PRIMARY CARE Navigator Cell: [phone]

## 2017-11-19 DIAGNOSIS — Z6841 Body Mass Index (BMI) 40.0 and over, adult: Secondary | ICD-10-CM | POA: Diagnosis not present

## 2017-11-19 DIAGNOSIS — M7918 Myalgia, other site: Secondary | ICD-10-CM | POA: Diagnosis not present

## 2017-11-19 DIAGNOSIS — R0789 Other chest pain: Secondary | ICD-10-CM | POA: Diagnosis not present

## 2017-11-19 DIAGNOSIS — R0602 Shortness of breath: Secondary | ICD-10-CM | POA: Diagnosis not present

## 2017-11-19 DIAGNOSIS — E1121 Type 2 diabetes mellitus with diabetic nephropathy: Secondary | ICD-10-CM | POA: Diagnosis not present

## 2017-11-19 DIAGNOSIS — I2510 Atherosclerotic heart disease of native coronary artery without angina pectoris: Secondary | ICD-10-CM | POA: Diagnosis not present

## 2017-11-22 ENCOUNTER — Encounter (HOSPITAL_COMMUNITY): Payer: Self-pay | Admitting: Internal Medicine

## 2017-11-22 ENCOUNTER — Ambulatory Visit (HOSPITAL_COMMUNITY)
Admission: RE | Admit: 2017-11-22 | Discharge: 2017-11-22 | Disposition: A | Discharge status: Home / Self Care | Payer: Medicare Other | Source: Ambulatory Visit | Attending: Internal Medicine | Admitting: Internal Medicine

## 2017-11-22 ENCOUNTER — Ambulatory Visit (HOSPITAL_BASED_OUTPATIENT_CLINIC_OR_DEPARTMENT_OTHER)
Admission: RE | Admit: 2017-11-22 | Discharge: 2017-11-22 | Disposition: A | Discharge status: Home / Self Care | Payer: Medicare Other | Source: Ambulatory Visit | Attending: Internal Medicine | Admitting: Internal Medicine

## 2017-11-22 ENCOUNTER — Encounter (HOSPITAL_COMMUNITY): Payer: Self-pay

## 2017-11-22 ENCOUNTER — Other Ambulatory Visit: Payer: Self-pay

## 2017-11-22 VITALS — BP 144/68 | HR 85 | Wt 251.5 lb

## 2017-11-22 DIAGNOSIS — E1122 Type 2 diabetes mellitus with diabetic chronic kidney disease: Secondary | ICD-10-CM | POA: Insufficient documentation

## 2017-11-22 DIAGNOSIS — E785 Hyperlipidemia, unspecified: Secondary | ICD-10-CM | POA: Diagnosis not present

## 2017-11-22 DIAGNOSIS — Z8249 Family history of ischemic heart disease and other diseases of the circulatory system: Secondary | ICD-10-CM | POA: Insufficient documentation

## 2017-11-22 DIAGNOSIS — Z91013 Allergy to seafood: Secondary | ICD-10-CM | POA: Diagnosis not present

## 2017-11-22 DIAGNOSIS — N183 Chronic kidney disease, stage 3 unspecified: Secondary | ICD-10-CM

## 2017-11-22 DIAGNOSIS — Z87891 Personal history of nicotine dependence: Secondary | ICD-10-CM | POA: Diagnosis not present

## 2017-11-22 DIAGNOSIS — Z886 Allergy status to analgesic agent status: Secondary | ICD-10-CM | POA: Insufficient documentation

## 2017-11-22 DIAGNOSIS — I2510 Atherosclerotic heart disease of native coronary artery without angina pectoris: Secondary | ICD-10-CM | POA: Diagnosis not present

## 2017-11-22 DIAGNOSIS — Z955 Presence of coronary angioplasty implant and graft: Secondary | ICD-10-CM | POA: Insufficient documentation

## 2017-11-22 DIAGNOSIS — G4733 Obstructive sleep apnea (adult) (pediatric): Secondary | ICD-10-CM | POA: Insufficient documentation

## 2017-11-22 DIAGNOSIS — I480 Paroxysmal atrial fibrillation: Secondary | ICD-10-CM | POA: Insufficient documentation

## 2017-11-22 DIAGNOSIS — Z7902 Long term (current) use of antithrombotics/antiplatelets: Secondary | ICD-10-CM | POA: Insufficient documentation

## 2017-11-22 DIAGNOSIS — Z6841 Body Mass Index (BMI) 40.0 and over, adult: Secondary | ICD-10-CM | POA: Diagnosis not present

## 2017-11-22 DIAGNOSIS — Z79899 Other long term (current) drug therapy: Secondary | ICD-10-CM | POA: Diagnosis not present

## 2017-11-22 DIAGNOSIS — K3184 Gastroparesis: Secondary | ICD-10-CM | POA: Diagnosis not present

## 2017-11-22 DIAGNOSIS — D649 Anemia, unspecified: Secondary | ICD-10-CM | POA: Insufficient documentation

## 2017-11-22 DIAGNOSIS — Z794 Long term (current) use of insulin: Secondary | ICD-10-CM | POA: Insufficient documentation

## 2017-11-22 DIAGNOSIS — E669 Obesity, unspecified: Secondary | ICD-10-CM | POA: Insufficient documentation

## 2017-11-22 DIAGNOSIS — I5032 Chronic diastolic (congestive) heart failure: Secondary | ICD-10-CM

## 2017-11-22 DIAGNOSIS — I10 Essential (primary) hypertension: Secondary | ICD-10-CM | POA: Diagnosis not present

## 2017-11-22 DIAGNOSIS — Z888 Allergy status to other drugs, medicaments and biological substances status: Secondary | ICD-10-CM | POA: Diagnosis not present

## 2017-11-22 DIAGNOSIS — I5030 Unspecified diastolic (congestive) heart failure: Secondary | ICD-10-CM

## 2017-11-22 DIAGNOSIS — I130 Hypertensive heart and chronic kidney disease with heart failure and stage 1 through stage 4 chronic kidney disease, or unspecified chronic kidney disease: Secondary | ICD-10-CM | POA: Diagnosis not present

## 2017-11-22 DIAGNOSIS — E1143 Type 2 diabetes mellitus with diabetic autonomic (poly)neuropathy: Secondary | ICD-10-CM | POA: Insufficient documentation

## 2017-11-22 DIAGNOSIS — E1140 Type 2 diabetes mellitus with diabetic neuropathy, unspecified: Secondary | ICD-10-CM | POA: Insufficient documentation

## 2017-11-22 LAB — BASIC METABOLIC PANEL
Anion gap: 8 (ref 5–15)
BUN: 22 mg/dL — ABNORMAL HIGH (ref 6–20)
CO2: 26 mmol/L (ref 22–32)
Calcium: 9 mg/dL (ref 8.9–10.3)
Chloride: 105 mmol/L (ref 101–111)
Creatinine, Ser: 1.14 mg/dL — ABNORMAL HIGH (ref 0.44–1.00)
GFR calc Af Amer: >60 mL/min (ref >60)
GFR calc non Af Amer: 54 mL/min — ABNORMAL LOW (ref >60)
Glucose, Bld: 126 mg/dL — ABNORMAL HIGH (ref 65–99)
Potassium: 4.5 mmol/L (ref 3.5–5.1)
Sodium: 139 mmol/L (ref 135–145)

## 2017-11-22 NOTE — Patient Instructions (Signed)
Labs today  Your physician recommends that you schedule a follow-up appointment in: 3 months  You have been referred for a Cardiomems Device  Please review the educational material we have provided to you  We will submit the application to your insurance company, this process can take 4-8 weeks  Once it is approved we will call you to schedule the procedure   Once it is implanted you can expect:   A weekly call from our office about your readings for the first 3 weeks after implant  After that we will just call you periodically    A follow up appointment with our office about 3 weeks after implant   A monthly bill from our office   **If you have questions about this process or the device please Kevan Rosebush, RN at [phone]

## 2017-11-22 NOTE — Progress Notes (Signed)
ADVANCED HF CLINIC  NOTE  Referring Physician: Dr Bettina Gavia  Primary Care: Dr Tobie Poet in [place] Primary Cardiologist: Dr Bettina Gavia  Pulmonary: Dr Alcide Clever  HPI: Dana [name] is a 57 y.o. female with a history of obesity, diastolic heart failure, DMI, neuropathy, HTN, hyperlipidemia, CAD (most recent [facility] 08/2016 DES RCA and stent LAD total of 4 stents), anemia, OSA --> started CPAP 12/2016, PAF S/P ablation, PAD S/P L great toe and 4th toe amputation.   Admitted 4/24 - 11/11/17 with chest pain while getting up out of her recliner.  EKG unremarkable. CXR with pulmonary edema. Troponin  0.03 - 0.05. Diuresed with IV lasix with resolution of symptoms. Echo with normal EF as below. Torsemide cut back to 60 mg daily on discharge.   She presents today for post hospital follow up. Here with her daughter. Feeling better since hospitalization. Has finished cardiac rehab in [place], missed a lot of sessions, but ended up finishing over 5 months. Has bad flares of gastroparesis which limits her ability to go out. She has not needed extra torsemide since discharge. Takes metolazone on wednesdays +/- Saturday. Weight at home ~250 lbs +/- 2 lbs. Takes extra diuretics for 3-4 lbs of sudden weight gain. Sleeps propped up. Has very occasional palpitations.  Labs: 04/26/17 K 4.3 Creatinine 0.87  - Echo 11/07/17 LVEF 65-70%, Grade 2 DD. Trivial MR, Mild LAE.  ECHO 08/2016 EF 55% RV was not clearly visualized  Studies RHC/LHC 01/07/2017  Stents patent RA mean 7 RV 31/6 PA 31/10, mean 20 PCWP mean 7 LV 142/12 AO 134/64 Oxygen saturations: PA 66% AO 99% Cardiac Output (Fick) 7.31  Cardiac Index (Fick) 3.37 Left Main  No significant disease.  Left Anterior Descending  30% ostial stenosis. 30-40% proximal stenosis. Patent mid LAD stent. 40-50% mid LAD stenosis distal to stent.  Left Circumflex  Large OM1. 30% mid LCx stenosis after OM1. Moderate OM2 with 30-40% proximal stenosis. 30% distal LCx stenosis.    Right Coronary Artery  Serial 30-40% stenoses in the proximal RCA. Long stented segment in the PDA was patent.    Cardiac cath [facility] 08/30/16:  2. Severe stenosis of the distal RCA extending into the rPDA. 3. Patent stent in the LAD. 4. Moderate macrovascular disease elsewhere. 5. Diffuse microvascular disease seen. 6. Mild pulmonary hypertension. 7. Normal LVEDP. 8. Normal cardiac output. Interventional Summary Successful PCI / Xience Drug Eluting Stent (2.75/23 mm) of the distal Right RA 7 PCW 14 CO/CO 6.3/3  CTA 08/2016 --. No PE    Review of systems complete and found to be negative unless listed in HPI.    Past Medical History:  Diagnosis Date  . Anginal pain ([place])   . CAD (coronary artery disease)    08/2016 DES to RCA, also with history of LAD stenting.   . Diabetes ([place])   . Diastolic CHF ([place])   . Hyperlipidemia   . Hypertension   . OSA (obstructive sleep apnea)     Current Outpatient Medications  Medication Sig Dispense Refill  . albuterol (ACCUNEB) 0.63 MG/3ML nebulizer solution Inhale 1 ampule by nebulization every six (6) hours as needed for wheezing.    Marland Kitchen albuterol (PROVENTIL HFA;VENTOLIN HFA) 108 (90 Base) MCG/ACT inhaler Inhale 2 puffs into the lungs every 6 (six) hours as needed for wheezing or shortness of breath.     . carvedilol (COREG) 6.25 MG tablet Take 1 tablet (6.25 mg total) by mouth 2 (two) times daily. 60 tablet 3  . clopidogrel (PLAVIX)  75 MG tablet Take 75 mg by mouth at bedtime.     . eszopiclone (LUNESTA) 2 MG TABS tablet Take 2 mg by mouth at bedtime.     . ferrous sulfate 325 (65 FE) MG tablet Take 325 mg by mouth 2 (two) times daily with a meal.    . glucagon (GLUCAGON EMERGENCY) 1 MG injection Inject 1 mg into the muscle once as needed (as directed).     Marland Kitchen HYDROcodone-acetaminophen (NORCO) 7.5-325 MG tablet Take 1 tablet by mouth 2 (two) times daily.    . insulin NPH Human (HUMULIN N,NOVOLIN N) 100 UNIT/ML injection Inject 30 units  into the skin two times a day- before breakfast and supper/MDD=60 units    . insulin regular (NOVOLIN R,HUMULIN R) 100 units/mL injection Inject 7-15 Units into the skin 3 (three) times daily before meals.     . isosorbide mononitrate (IMDUR) 30 MG 24 hr tablet Take 0.5 tablets (15 mg total) by mouth daily. 15 tablet 6  . latanoprost (XALATAN) 0.005 % ophthalmic solution Place 1 drop into both eyes at bedtime.    Marland Kitchen linaclotide (LINZESS) 145 MCG CAPS capsule Take 145 mcg by mouth daily as needed (to accelerate bowel movements).     . metolazone (ZAROXOLYN) 2.5 MG tablet Take 1 tablet (2.5 mg total) by mouth once a week. Take Every Wednesday 15 tablet 3  . nitroGLYCERIN (NITROSTAT) 0.4 MG SL tablet Place 0.4 mg under the tongue every 5 (five) minutes as needed for chest pain.     . Omega-3 Fatty Acids (FISH OIL PO) Take 1 capsule by mouth daily.    . OXYGEN Inhale 2 L into the lungs at bedtime.    . pravastatin (PRAVACHOL) 40 MG tablet Take 80 mg by mouth at bedtime.     . Probiotic CAPS Take 1 capsule by mouth daily.    . promethazine (PHENERGAN) 25 MG tablet Take 25 mg by mouth every 6 (six) hours as needed for nausea or vomiting.    Marland Kitchen spironolactone (ALDACTONE) 25 MG tablet Take 0.5 tablets (12.5 mg total) by mouth daily. 15 tablet 3  . torsemide (DEMADEX) 20 MG tablet Take 3 tablets (60 mg total) by mouth daily.     No current facility-administered medications for this encounter.     Allergies  Allergen Reactions  . Naproxen Hives and Rash  . Celecoxib Other (See Comments)    Stomach pain  . Aspirin Other (See Comments)    CAN NOT TAKE DUE TO ULCERS  . Glucosamine Hives, Swelling and Other (See Comments)    Angioedema   . Metoclopramide Other (See Comments)    Facial twitching and stuttering  . Metolazone Other (See Comments)    Acute renal failure  . Shellfish-Derived Products Hives, Swelling and Other (See Comments)    Angioedema   . Tramadol Nausea And Vomiting and Other (See  Comments)           Social History   Socioeconomic History  . Marital status: Married    Spouse name: Not on file  . Number of children: Not on file  . Years of education: Not on file  . Highest education level: Not on file  Occupational History  . Not on file  Social Needs  . Financial resource strain: Not on file  . Food insecurity:    Worry: Not on file    Inability: Not on file  . Transportation needs:    Medical: Not on file    Non-medical: Not  on file  Tobacco Use  . Smoking status: Former Research scientist (life sciences)  . Smokeless tobacco: Never Used  Substance and Sexual Activity  . Alcohol use: Not Currently  . Drug use: Not Currently  . Sexual activity: Not on file  Lifestyle  . Physical activity:    Days per week: Not on file    Minutes per session: Not on file  . Stress: Not on file  Relationships  . Social connections:    Talks on phone: Not on file    Gets together: Not on file    Attends religious service: Not on file    Active member of club or organization: Not on file    Attends meetings of clubs or organizations: Not on file    Relationship status: Not on file  . Intimate partner violence:    Fear of current or ex partner: Not on file    Emotionally abused: Not on file    Physically abused: Not on file    Forced sexual activity: Not on file  Other Topics Concern  . Not on file  Social History Narrative  . Not on file    FamHx:  Denies h/o familial CM or SCD. Multiple family members with CAD.   Vitals:   11/22/17 0933  BP: (!) 144/68  Pulse: 85  SpO2: 98%  Weight: 251 lb 8 oz (114.1 kg)   Wt Readings from Last 3 Encounters:  11/22/17 251 lb 8 oz (114.1 kg)  11/11/17 249 lb 8 oz (113.2 kg)  05/16/17 253 lb 4 oz (114.9 kg)    PHYSICAL EXAM: General: Well appearing. No resp difficulty. HEENT: Normal. Anicteric Neck: Supple. JVP 7-8. Carotids 2+ bilat; no bruits. No thyromegaly or nodule noted. Cor: PMI nondisplaced. RRR, 2/6 TR.  Lungs: CTAB,  normal effort. No wheeze Abdomen: Obese, soft, non-tender, non-distended, no HSM. No bruits or masses. +BS  Extremities: No cyanosis, clubbing, or rash. 1+ edema.   Neuro: Alert & orientedx3, cranial nerves grossly intact. moves all 4 extremities w/o difficulty. Affect pleasant   ASSESSMENT & PLAN:  1. Chronic Diastolic Heart Failure-  - Echo 11/07/17 LVEF 65-70%, Grade 2 DD - NYHA III symptoms. - Volume status stable on exam.   - Continue torsemide 60 daily. Reviewed sliding scale diuretics. Goal weight at home 250 lbs. Should take extra diuretics for weight > 255 lbs. - Continue metolazone 2.5 mg on Wednesday.  - Continue coreg 6.25 mg BID.  - Continue spiro 12.5 daily. Follow BMETs - Reinforced low salt food choices, and limiting fluid intake to < 2 liters per day.   - We discussed Cardiomems implant and she is interested in proceeding. Will being screening process. 2. Obesity - Body mass index is 43.17 kg/m.  - Encouraged continued diet and exercise efforts.   3. OSA - Encouraged nightly use of CPAP.  4. CAD - 4 stents. 08/2016  stent RCA.  - Recent LHC nonobstructive.  - On plavix, bb, and statin.  - Has finished [facility] at [facility].  5. PAF - S/P ablation 2016. - Regular on exam. EKG 11/06/17 with NSR.  - Stressed compliance with CPAP - Holter monitor 06/2017 with occasional PACs and SVT.  - BB as above.  7. HTN:  - Meds as above.  8. CKD III - Baseline creatinine appears to be 1.3 - 1.5   [name], MD  10:11 AM

## 2017-11-22 NOTE — Progress Notes (Signed)
Discussed Cardiomems with patient, provided and reviewed education booklet with her.  She agreeable with proceeding at this time, will submit info to [employer] and await approval.

## 2017-11-25 DIAGNOSIS — G4733 Obstructive sleep apnea (adult) (pediatric): Secondary | ICD-10-CM | POA: Diagnosis not present

## 2017-11-26 DIAGNOSIS — R5383 Other fatigue: Secondary | ICD-10-CM | POA: Diagnosis not present

## 2017-11-26 DIAGNOSIS — E559 Vitamin D deficiency, unspecified: Secondary | ICD-10-CM | POA: Diagnosis not present

## 2017-11-26 DIAGNOSIS — G4733 Obstructive sleep apnea (adult) (pediatric): Secondary | ICD-10-CM | POA: Diagnosis not present

## 2017-11-26 DIAGNOSIS — J4520 Mild intermittent asthma, uncomplicated: Secondary | ICD-10-CM | POA: Diagnosis not present

## 2017-11-27 DIAGNOSIS — Z794 Long term (current) use of insulin: Secondary | ICD-10-CM | POA: Diagnosis not present

## 2017-11-27 DIAGNOSIS — Z95818 Presence of other cardiac implants and grafts: Secondary | ICD-10-CM | POA: Diagnosis not present

## 2017-11-27 DIAGNOSIS — E1042 Type 1 diabetes mellitus with diabetic polyneuropathy: Secondary | ICD-10-CM | POA: Diagnosis not present

## 2017-11-27 DIAGNOSIS — I10 Essential (primary) hypertension: Secondary | ICD-10-CM | POA: Diagnosis not present

## 2017-11-27 DIAGNOSIS — E782 Mixed hyperlipidemia: Secondary | ICD-10-CM | POA: Diagnosis not present

## 2017-11-27 DIAGNOSIS — E1065 Type 1 diabetes mellitus with hyperglycemia: Secondary | ICD-10-CM | POA: Diagnosis not present

## 2017-11-27 DIAGNOSIS — K3184 Gastroparesis: Secondary | ICD-10-CM | POA: Diagnosis not present

## 2017-12-06 DIAGNOSIS — Z6839 Body mass index (BMI) 39.0-39.9, adult: Secondary | ICD-10-CM | POA: Diagnosis not present

## 2017-12-06 DIAGNOSIS — M47816 Spondylosis without myelopathy or radiculopathy, lumbar region: Secondary | ICD-10-CM | POA: Diagnosis not present

## 2017-12-09 DIAGNOSIS — N3001 Acute cystitis with hematuria: Secondary | ICD-10-CM | POA: Diagnosis not present

## 2017-12-09 DIAGNOSIS — N3091 Cystitis, unspecified with hematuria: Secondary | ICD-10-CM | POA: Diagnosis not present

## 2017-12-30 DIAGNOSIS — R600 Localized edema: Secondary | ICD-10-CM | POA: Diagnosis not present

## 2017-12-30 DIAGNOSIS — L03115 Cellulitis of right lower limb: Secondary | ICD-10-CM | POA: Diagnosis not present

## 2017-12-31 DIAGNOSIS — D649 Anemia, unspecified: Secondary | ICD-10-CM | POA: Diagnosis not present

## 2017-12-31 DIAGNOSIS — E039 Hypothyroidism, unspecified: Secondary | ICD-10-CM | POA: Diagnosis not present

## 2017-12-31 DIAGNOSIS — R309 Painful micturition, unspecified: Secondary | ICD-10-CM | POA: Diagnosis not present

## 2017-12-31 DIAGNOSIS — E1169 Type 2 diabetes mellitus with other specified complication: Secondary | ICD-10-CM | POA: Diagnosis not present

## 2017-12-31 DIAGNOSIS — I10 Essential (primary) hypertension: Secondary | ICD-10-CM | POA: Diagnosis not present

## 2017-12-31 DIAGNOSIS — E669 Obesity, unspecified: Secondary | ICD-10-CM | POA: Diagnosis not present

## 2017-12-31 DIAGNOSIS — N189 Chronic kidney disease, unspecified: Secondary | ICD-10-CM | POA: Diagnosis not present

## 2017-12-31 DIAGNOSIS — R809 Proteinuria, unspecified: Secondary | ICD-10-CM | POA: Diagnosis not present

## 2017-12-31 DIAGNOSIS — I509 Heart failure, unspecified: Secondary | ICD-10-CM | POA: Diagnosis not present

## 2018-01-03 DIAGNOSIS — R600 Localized edema: Secondary | ICD-10-CM | POA: Diagnosis not present

## 2018-01-03 DIAGNOSIS — L03115 Cellulitis of right lower limb: Secondary | ICD-10-CM | POA: Diagnosis not present

## 2018-01-03 DIAGNOSIS — I119 Hypertensive heart disease without heart failure: Secondary | ICD-10-CM | POA: Diagnosis not present

## 2018-01-07 ENCOUNTER — Emergency Department (HOSPITAL_COMMUNITY)
Admission: EM | Admit: 2018-01-07 | Discharge: 2018-01-07 | Disposition: A | Discharge status: Home / Self Care | Payer: Medicare Other | Attending: Emergency Medicine | Admitting: Emergency Medicine

## 2018-01-07 ENCOUNTER — Encounter (HOSPITAL_COMMUNITY): Payer: Self-pay | Admitting: Emergency Medicine

## 2018-01-07 ENCOUNTER — Telehealth (HOSPITAL_COMMUNITY): Payer: Self-pay

## 2018-01-07 ENCOUNTER — Other Ambulatory Visit: Payer: Self-pay

## 2018-01-07 ENCOUNTER — Emergency Department (HOSPITAL_COMMUNITY): Payer: Medicare Other

## 2018-01-07 DIAGNOSIS — R55 Syncope and collapse: Secondary | ICD-10-CM | POA: Insufficient documentation

## 2018-01-07 DIAGNOSIS — R4020 Unspecified coma: Secondary | ICD-10-CM | POA: Diagnosis not present

## 2018-01-07 DIAGNOSIS — I509 Heart failure, unspecified: Secondary | ICD-10-CM | POA: Diagnosis not present

## 2018-01-07 DIAGNOSIS — Z87891 Personal history of nicotine dependence: Secondary | ICD-10-CM | POA: Diagnosis not present

## 2018-01-07 DIAGNOSIS — I110 Hypertensive heart disease with heart failure: Secondary | ICD-10-CM | POA: Insufficient documentation

## 2018-01-07 DIAGNOSIS — E119 Type 2 diabetes mellitus without complications: Secondary | ICD-10-CM | POA: Diagnosis not present

## 2018-01-07 DIAGNOSIS — Z79899 Other long term (current) drug therapy: Secondary | ICD-10-CM | POA: Diagnosis not present

## 2018-01-07 LAB — COMPREHENSIVE METABOLIC PANEL
ALT: 17 U/L (ref 0–44)
AST: 16 U/L (ref 15–41)
Albumin: 3 g/dL — ABNORMAL LOW (ref 3.5–5.0)
Alkaline Phosphatase: 153 U/L — ABNORMAL HIGH (ref 38–126)
Anion gap: 8 (ref 5–15)
BUN: 26 mg/dL — ABNORMAL HIGH (ref 6–20)
CO2: 29 mmol/L (ref 22–32)
Calcium: 8.7 mg/dL — ABNORMAL LOW (ref 8.9–10.3)
Chloride: 104 mmol/L (ref 98–111)
Creatinine, Ser: 1.31 mg/dL — ABNORMAL HIGH (ref 0.44–1.00)
GFR calc Af Amer: 52 mL/min — ABNORMAL LOW (ref >60)
GFR calc non Af Amer: 45 mL/min — ABNORMAL LOW (ref >60)
Glucose, Bld: 92 mg/dL (ref 70–99)
Potassium: 3.8 mmol/L (ref 3.5–5.1)
Sodium: 141 mmol/L (ref 135–145)
Total Bilirubin: 0.7 mg/dL (ref 0.3–1.2)
Total Protein: 7.5 g/dL (ref 6.5–8.1)

## 2018-01-07 LAB — URINALYSIS, ROUTINE W REFLEX MICROSCOPIC
Bilirubin Urine: NEGATIVE
Glucose, UA: NEGATIVE mg/dL
Hgb urine dipstick: NEGATIVE
Ketones, ur: NEGATIVE mg/dL
Leukocytes, UA: NEGATIVE
Nitrite: NEGATIVE
Protein, ur: 30 mg/dL — AB
Specific Gravity, Urine: 1.008 (ref 1.005–1.030)
pH: 6 (ref 5.0–8.0)

## 2018-01-07 LAB — RAPID URINE DRUG SCREEN, HOSP PERFORMED
Amphetamines: NOT DETECTED
Benzodiazepines: NOT DETECTED
Cocaine: NOT DETECTED
Opiates: POSITIVE — AB
Tetrahydrocannabinol: NOT DETECTED

## 2018-01-07 LAB — CBC WITH DIFFERENTIAL/PLATELET
Abs Immature Granulocytes: 0 10*3/uL (ref 0.0–0.1)
Basophils Absolute: 0 10*3/uL (ref 0.0–0.1)
Basophils Relative: 0 %
Eosinophils Absolute: 0.1 10*3/uL (ref 0.0–0.7)
Eosinophils Relative: 1 %
HCT: 29.8 % — ABNORMAL LOW (ref 36.0–46.0)
Hemoglobin: 9.1 g/dL — ABNORMAL LOW (ref 12.0–15.0)
Immature Granulocytes: 0 %
Lymphocytes Relative: 19 %
Lymphs Abs: 2.2 10*3/uL (ref 0.7–4.0)
MCH: 25.6 pg — ABNORMAL LOW (ref 26.0–34.0)
MCHC: 30.5 g/dL (ref 30.0–36.0)
MCV: 83.9 fL (ref 78.0–100.0)
Monocytes Absolute: 0.7 10*3/uL (ref 0.1–1.0)
Monocytes Relative: 6 %
Neutro Abs: 8.5 10*3/uL — ABNORMAL HIGH (ref 1.7–7.7)
Neutrophils Relative %: 74 %
Platelets: 343 10*3/uL (ref 150–400)
RBC: 3.55 MIL/uL — ABNORMAL LOW (ref 3.87–5.11)
RDW: 15.6 % — ABNORMAL HIGH (ref 11.5–15.5)
WBC: 11.6 10*3/uL — ABNORMAL HIGH (ref 4.0–10.5)

## 2018-01-07 LAB — I-STAT TROPONIN, ED: Troponin i, poc: 0.03 ng/mL (ref 0.00–0.08)

## 2018-01-07 MED ORDER — SODIUM CHLORIDE 0.9 % IV BOLUS
500.0000 mL | Freq: Once | INTRAVENOUS | Status: AC
  Administered 2018-01-07: 500 mL via INTRAVENOUS

## 2018-01-07 NOTE — Discharge Instructions (Addendum)
Follow up with your cardiologist, call to schedule an appointment, return to the ED immediately if you have any recurrent symptoms

## 2018-01-07 NOTE — ED Triage Notes (Addendum)
Pt reports her husband came home from work and found her passed out on floor. States she may have been a little more tired than usual but nothing out of the ordinary. States she got up and was a sink and got lightheaded and heard loud ringing in her ears. A&Ox4. Headache. States she checked CBG right when woke up and it was 153.

## 2018-01-07 NOTE — ED Provider Notes (Signed)
Patient placed in Quick Look pathway, seen and evaluated   Chief Complaint: Passed out on floor  HPI:   Patient's husband came home and found her passed out on the floor, she reports that she drank a cup of coffee, felt light headed, and the next thing she knows her husband was waking her up on the floor.  Headache.    ROS: No chest pain, no SOB.   Physical Exam:   Gen: No distress  Neuro: Awake and Alert  Skin: Warm    Focused Exam: A and O x3.  No facial droop.     Initiation of care has begun. The patient has been counseled on the process, plan, and necessity for staying for the completion/evaluation, and the remainder of the medical screening examination    Ollen Gross 01/07/18 1502    Tegeler, Gwenyth Allegra, MD 01/07/18 [phone]

## 2018-01-07 NOTE — ED Provider Notes (Signed)
[facility] EMERGENCY DEPARTMENT Provider Note   CSN: [id number] Arrival date & time: 01/07/18  1438     History   Chief Complaint Chief Complaint  Patient presents with  . Loss of Consciousness    HPI Dana [name] is a 57 y.o. female.  HPI Pt was in her kitchen at home.  She had already been out to the store.  Pt was standing and felt lightheaded, she remembers wiping her face with her hand and then heard a loud rining in her ear.  The next thing she remembers is family talking to her on the floor.  Pt is not sure how long she was down on the ground but at most it might have been around 30 minutes based on the timeframe.  Pt checked her blood sugar when she woke up and it was fine.  No CP or SOB. She does have a mild headache after the fall but she is not sure if she hit her head.  NO abd pain.  No vomiting or diarrhea.  Pt has had syncope in the past  related to her blood sugar. Past Medical History:  Diagnosis Date  . Anginal pain ([place])   . CAD (coronary artery disease)    08/2016 DES to RCA, also with history of LAD stenting.   . Diabetes ([place])   . Diastolic CHF ([place])   . Hyperlipidemia   . Hypertension   . OSA (obstructive sleep apnea)     Patient Active Problem List   Diagnosis Date Noted  . Demand ischemia ([place])   . Hypoglycemia due to insulin 11/07/2017  . Diastolic heart failure ([place]) 11/07/2017  . Hypertensive urgency 11/07/2017  . Obesity, Class III, BMI 40-49.9 (morbid obesity) ([place]) 11/07/2017  . Coronary artery disease involving native coronary artery of native heart without angina pectoris 12/25/2016  . Orthostatic dizziness 12/25/2016  . Mixed hyperlipidemia 12/14/2016  . Anemia 03/23/2016  . CAD (coronary artery disease) 03/23/2016  . Chronic pain 03/23/2016  . COPD (chronic obstructive pulmonary disease) ([place]) 03/23/2016  . GERD (gastroesophageal reflux disease) 03/23/2016  . HTN (hypertension) 03/23/2016  . Insulin long-term use  ([place]) 10/12/2015  . Uncontrolled type 1 diabetes mellitus with diabetic polyneuropathy ([place]) 10/12/2015  . Atrial fibrillation ([place]) 10/05/2015  . S/P ablation of atrial fibrillation 10/04/2015  . Acute renal failure with tubular necrosis ([place]) 08/30/2015  . SIRS (systemic inflammatory response syndrome) ([place]) 08/30/2015  . Orthostatic hypotension 07/27/2015  . Paroxysmal A-fib ([place]) 07/06/2015  . Chest pain 06/14/2015  . Essential hypertension 04/22/2015  . Diabetic peripheral neuropathy associated with type 2 diabetes mellitus ([place]) 04/02/2014  . Bloating 03/01/2013    Past Surgical History:  Procedure Laterality Date  . CARPAL TUNNEL RELEASE    . CESAREAN SECTION    . CHOLECYSTECTOMY    . EYE SURGERY    . RIGHT/LEFT HEART CATH AND CORONARY ANGIOGRAPHY N/A 01/07/2017   Procedure: Right/Left Heart Cath and Coronary Angiography;  Surgeon: Larey Dresser, MD;  Location: [facility];  Service: Cardiovascular;  Laterality: N/A;  . ROTATOR CUFF REPAIR    . TOE AMPUTATION Left      OB History   None      Home Medications    Prior to Admission medications   Medication Sig Start Date End Date Taking? Authorizing Provider  albuterol (ACCUNEB) 0.63 MG/3ML nebulizer solution Inhale 1 ampule by nebulization every six (6) hours as needed for wheezing.   Yes [provider]  albuterol (PROVENTIL HFA;VENTOLIN HFA) 108 (90 Base) MCG/ACT inhaler Inhale 2 puffs into the lungs every 6 (six) hours as needed for wheezing or shortness of breath.    Yes [provider]  carvedilol (COREG) 6.25 MG tablet Take 1 tablet (6.25 mg total) by mouth 2 (two) times daily. 05/16/17 07/15/20 Yes Bensimhon, Shaune Pascal, MD  clopidogrel (PLAVIX) 75 MG tablet Take 75 mg by mouth at bedtime.  08/15/15  Yes [provider]  eszopiclone (LUNESTA) 2 MG TABS tablet Take 2 mg by mouth at bedtime.    Yes [provider]  ferrous sulfate 325 (65 FE) MG tablet Take 325 mg by mouth 2  (two) times daily with a meal.   Yes [provider]  hydrALAZINE (APRESOLINE) 50 MG tablet Take 50 mg by mouth 2 (two) times daily. 12/31/17  Yes [provider]  HYDROcodone-acetaminophen (NORCO) 7.5-325 MG tablet Take 1 tablet by mouth 2 (two) times daily.   Yes [provider]  insulin NPH Human (HUMULIN N,NOVOLIN N) 100 UNIT/ML injection Inject 30 units into the skin two times a day- before breakfast and supper/MDD=60 units 11/21/15  Yes [provider]  insulin regular (NOVOLIN R,HUMULIN R) 100 units/mL injection Inject 7-15 Units into the skin 3 (three) times daily before meals.    Yes [provider]  isosorbide mononitrate (IMDUR) 30 MG 24 hr tablet Take 0.5 tablets (15 mg total) by mouth daily. 01/31/17 11/23/18 Yes Clegg, Amy D, NP  latanoprost (XALATAN) 0.005 % ophthalmic solution Place 1 drop into both eyes at bedtime.   Yes [provider]  metolazone (ZAROXOLYN) 2.5 MG tablet Take 1 tablet (2.5 mg total) by mouth once a week. Take Every Wednesday 12/26/16  Yes Bensimhon, Shaune Pascal, MD  nitroGLYCERIN (NITROSTAT) 0.4 MG SL tablet Place 0.4 mg under the tongue every 5 (five) minutes as needed for chest pain.    Yes [provider]  Omega-3 Fatty Acids (FISH OIL PO) Take 1 capsule by mouth daily.   Yes [provider]  OXYGEN Inhale 2 L into the lungs at bedtime.   Yes [provider]  pravastatin (PRAVACHOL) 40 MG tablet Take 80 mg by mouth at bedtime.    Yes [provider]  Probiotic CAPS Take 1 capsule by mouth daily.   Yes [provider]  promethazine (PHENERGAN) 25 MG tablet Take 25 mg by mouth every 6 (six) hours as needed for nausea or vomiting.   Yes [provider]  spironolactone (ALDACTONE) 25 MG tablet Take 0.5 tablets (12.5 mg total) by mouth daily. 05/16/17 11/23/18 Yes Bensimhon, Shaune Pascal, MD  torsemide (DEMADEX) 20 MG tablet Take 3 tablets (60 mg total) by mouth  daily. Patient taking differently: Take 20 mg by mouth 3 (three) times daily.  11/11/17  Yes Thurnell Lose, MD  Vitamin D, Ergocalciferol, (DRISDOL) 50000 units CAPS capsule Take 50,000 Units by mouth once a week. Every wednesday 11/27/17  Yes [provider]    Family History Family History  Problem Relation Age of Onset  . Hypertension Father     Social History Social History   Tobacco Use  . Smoking status: Former Research scientist (life sciences)  . Smokeless tobacco: Never Used  Substance Use Topics  . Alcohol use: Not Currently  . Drug use: Not Currently     Allergies   Naproxen; Celecoxib; Aspirin; Glucosamine; Metoclopramide; Metolazone; Shellfish-derived products; and Tramadol   Review of Systems Review of Systems  All other systems reviewed and are negative.  Physical Exam Updated Vital Signs BP (!) 142/54   Pulse 72   Temp 98.2 F (36.8 C) (Oral)   Resp 12   SpO2 97%   Physical Exam  Constitutional: She is oriented to person, place, and time. She appears well-developed and well-nourished. No distress.  HENT:  Head: Normocephalic and atraumatic.  Right Ear: External ear normal.  Left Ear: External ear normal.  Mouth/Throat: Oropharynx is clear and moist.  Eyes: Conjunctivae are normal. Right eye exhibits no discharge. Left eye exhibits no discharge. No scleral icterus.  Neck: Neck supple. No tracheal deviation present.  Cardiovascular: Normal rate, regular rhythm and intact distal pulses.  Pulmonary/Chest: Effort normal and breath sounds normal. No stridor. No respiratory distress. She has no wheezes. She has no rales.  Abdominal: Soft. Bowel sounds are normal. She exhibits no distension. There is no tenderness. There is no rebound and no guarding.  Musculoskeletal: She exhibits no edema or tenderness.  Neurological: She is alert and oriented to person, place, and time. She has normal strength. No cranial nerve deficit (No facial droop, extraocular movements intact,  tongue midline ) or sensory deficit. She exhibits normal muscle tone. She displays no seizure activity. Coordination normal.  No pronator drift bilateral upper extrem, able to hold both legs off bed for 5 seconds, sensation intact in all extremities, no visual field cuts, no left or right sided neglect, normal finger-nose exam bilaterally, no nystagmus noted   Skin: Skin is warm and dry. No rash noted.  Psychiatric: She has a normal mood and affect.  Nursing note and vitals reviewed.    ED Treatments / Results  Labs (all labs ordered are listed, but only abnormal results are displayed) Labs Reviewed  COMPREHENSIVE METABOLIC PANEL - Abnormal; Notable for the following components:      Result Value   BUN 26 (*)    Creatinine, Ser 1.31 (*)    Calcium 8.7 (*)    Albumin 3.0 (*)    Alkaline Phosphatase 153 (*)    GFR calc non Af Amer 45 (*)    GFR calc Af Amer 52 (*)    All other components within normal limits  RAPID URINE DRUG SCREEN, HOSP PERFORMED - Abnormal; Notable for the following components:   Opiates POSITIVE (*)    Barbiturates   (*)    Value: Result not available. Reagent lot number recalled by manufacturer.   All other components within normal limits  CBC WITH DIFFERENTIAL/PLATELET - Abnormal; Notable for the following components:   WBC 11.6 (*)    RBC 3.55 (*)    Hemoglobin 9.1 (*)    HCT 29.8 (*)    MCH 25.6 (*)    RDW 15.6 (*)    Neutro Abs 8.5 (*)    All other components within normal limits  URINALYSIS, ROUTINE W REFLEX MICROSCOPIC - Abnormal; Notable for the following components:   Color, Urine STRAW (*)    Protein, ur 30 (*)    Bacteria, UA RARE (*)    All other components within normal limits  I-STAT TROPONIN, ED    EKG EKG Interpretation  Date/Time:  Tuesday January 07 2018 14:59:39 EDT Ventricular Rate:  81 PR Interval:  156 QRS Duration: 70 QT Interval:  384 QTC Calculation: 446 R Axis:   48 Text Interpretation:  Normal sinus rhythm Normal ECG No  old tracing to compare Confirmed by Dorie Rank [id number]) on 01/07/2018 4:09:46 PM   Radiology Ct Head Wo Contrast  Result Date: 01/07/2018 CLINICAL DATA:  57 year old female with acute altered level of consciousness and possible syncope. EXAM: CT HEAD WITHOUT CONTRAST TECHNIQUE: Contiguous axial images were obtained from the base of the skull through the vertex without intravenous contrast. COMPARISON:  06/18/2014 CT and prior studies FINDINGS: Brain: No evidence of acute infarction, hemorrhage, hydrocephalus, extra-axial collection or mass lesion/mass effect. Mild chronic small-vessel white matter ischemic changes are noted. Vascular: Atherosclerotic calcifications again identified. Skull: Normal. Negative for fracture or focal lesion. Sinuses/Orbits: Chronic mucoperiosteal thickening of the LEFT sphenoid sinus again noted. No acute abnormalities. Other: None IMPRESSION: 1. No acute intracranial abnormality 2. Mild chronic small-vessel white matter ischemic changes 3. Chronic LEFT sphenoid sinusitis. Electronically Signed   By: Margarette Canada M.D.   On: 01/07/2018 16:01    Procedures Procedures (including critical care time)  Medications Ordered in ED Medications  sodium chloride 0.9 % bolus 500 mL (500 mLs Intravenous New Bag/Given 01/07/18 1709)     Initial Impression / Assessment and Plan / ED Course  I have reviewed the triage vital signs and the nursing notes.  Pertinent labs & imaging results that were available during my care of the patient were reviewed by me and considered in my medical decision making (see chart for details).  Clinical Course as of Jan 08 1751  Tue Jan 07, 2018  1612 Stable anemia.  Stable renal function.   [JK]  1750 Urinalysis without signs of infection   [JK]  1750 Patient has remained stable.  She denies any complaints.   [JK]    Clinical Course User Index [JK] Dorie Rank, MD    Patient presented to the emergency room after having a probable syncopal  episode at home.  No signs of any injuries here in the emergency room.  She denies any complaints.  Cannot exclude the possibility of seizure as well although patient states she has not had a seizure in many years.  unclear how long she was down on the ground but it does not sound like there is a post ictal period.  Patient is ED work-up is reassuring.  Patient does have history of congestive heart failure and is at risk for cardiac dysrhythmia.  We discussed admitting to the hospital for observation versus close outpatient follow-up.  Patient states she is feeling well and would prefer to go home.  She will make sure to call her cardiologist in the morning.  I stressed the importance of returning to the emergency room if she has any recurrent symptoms.  Final Clinical Impressions(s) / ED Diagnoses   Final diagnoses:  Syncope and collapse    ED Discharge Orders    None       Dorie Rank, MD 01/07/18 1752

## 2018-01-07 NOTE — ED Notes (Signed)
Pt informed nurse that her self monitoring insulin pump had her CBG as 62. Pt slightly lightheaded. Pt given crackers and orange juice. MD notified.

## 2018-01-07 NOTE — ED Notes (Signed)
Patient verbalizes understanding of discharge instructions. Opportunity for questioning and answers were provided. Armband removed by staff, pt discharged from ED in wheelchair.  

## 2018-01-07 NOTE — Telephone Encounter (Signed)
Patient's husband calling to report wife was found passed out upon his arrival to home. Husband says she eventually came to, but would like to be seen in CHF clinic today for this issue. Advised no openings today, however, regardless wife should report to ER immediately to rule out other more serious/life threatening causes. Husband seemed irritable of not being able to come to the CHF clinic for this issue and declines taking wife to the ER.  States he will go to urgent care instead for a shorter wait. Advised urgent care will likely send him to ER anyway, however husband adamant this will be his plan since we are not able to see them today. Nothing further to address.  Renee Pain, RN

## 2018-01-08 ENCOUNTER — Other Ambulatory Visit (HOSPITAL_COMMUNITY): Payer: Self-pay | Admitting: Internal Medicine

## 2018-01-08 ENCOUNTER — Telehealth (HOSPITAL_COMMUNITY): Payer: Self-pay | Admitting: Vascular Surgery

## 2018-01-08 NOTE — Telephone Encounter (Signed)
Pt called she was seen in ed last night she was told to make f/u appt w/ db, db has no opening until Aug, pt saw Amy before so I made her a f/u in NP/PA clinic @ 10 01/23/18

## 2018-01-17 DIAGNOSIS — Z7902 Long term (current) use of antithrombotics/antiplatelets: Secondary | ICD-10-CM | POA: Diagnosis not present

## 2018-01-17 DIAGNOSIS — K3184 Gastroparesis: Secondary | ICD-10-CM | POA: Diagnosis not present

## 2018-01-17 DIAGNOSIS — I509 Heart failure, unspecified: Secondary | ICD-10-CM | POA: Diagnosis not present

## 2018-01-17 DIAGNOSIS — I4891 Unspecified atrial fibrillation: Secondary | ICD-10-CM | POA: Diagnosis not present

## 2018-01-17 DIAGNOSIS — K5904 Chronic idiopathic constipation: Secondary | ICD-10-CM | POA: Diagnosis not present

## 2018-01-17 DIAGNOSIS — Z1211 Encounter for screening for malignant neoplasm of colon: Secondary | ICD-10-CM | POA: Diagnosis not present

## 2018-01-23 ENCOUNTER — Encounter (HOSPITAL_COMMUNITY): Payer: Medicare Other

## 2018-01-27 ENCOUNTER — Other Ambulatory Visit (HOSPITAL_COMMUNITY): Payer: Self-pay | Admitting: Internal Medicine

## 2018-02-04 DIAGNOSIS — H40003 Preglaucoma, unspecified, bilateral: Secondary | ICD-10-CM | POA: Diagnosis not present

## 2018-02-07 ENCOUNTER — Encounter (HOSPITAL_COMMUNITY): Payer: Medicare Other

## 2018-02-11 ENCOUNTER — Other Ambulatory Visit: Payer: Self-pay

## 2018-02-11 ENCOUNTER — Emergency Department (HOSPITAL_COMMUNITY)
Admission: EM | Admit: 2018-02-11 | Discharge: 2018-02-11 | Disposition: A | Discharge status: Home / Self Care | Payer: Medicare Other | Attending: Emergency Medicine | Admitting: Emergency Medicine

## 2018-02-11 ENCOUNTER — Emergency Department (HOSPITAL_COMMUNITY): Payer: Medicare Other

## 2018-02-11 ENCOUNTER — Encounter (HOSPITAL_COMMUNITY): Payer: Self-pay

## 2018-02-11 DIAGNOSIS — I2510 Atherosclerotic heart disease of native coronary artery without angina pectoris: Secondary | ICD-10-CM | POA: Insufficient documentation

## 2018-02-11 DIAGNOSIS — R0789 Other chest pain: Secondary | ICD-10-CM | POA: Insufficient documentation

## 2018-02-11 DIAGNOSIS — Z7902 Long term (current) use of antithrombotics/antiplatelets: Secondary | ICD-10-CM | POA: Diagnosis not present

## 2018-02-11 DIAGNOSIS — E1040 Type 1 diabetes mellitus with diabetic neuropathy, unspecified: Secondary | ICD-10-CM | POA: Insufficient documentation

## 2018-02-11 DIAGNOSIS — R0602 Shortness of breath: Secondary | ICD-10-CM | POA: Diagnosis not present

## 2018-02-11 DIAGNOSIS — J449 Chronic obstructive pulmonary disease, unspecified: Secondary | ICD-10-CM | POA: Insufficient documentation

## 2018-02-11 DIAGNOSIS — I5030 Unspecified diastolic (congestive) heart failure: Secondary | ICD-10-CM | POA: Diagnosis not present

## 2018-02-11 DIAGNOSIS — R079 Chest pain, unspecified: Secondary | ICD-10-CM | POA: Diagnosis not present

## 2018-02-11 DIAGNOSIS — R7989 Other specified abnormal findings of blood chemistry: Secondary | ICD-10-CM | POA: Diagnosis not present

## 2018-02-11 DIAGNOSIS — R197 Diarrhea, unspecified: Secondary | ICD-10-CM | POA: Insufficient documentation

## 2018-02-11 DIAGNOSIS — I110 Hypertensive heart disease with heart failure: Secondary | ICD-10-CM | POA: Insufficient documentation

## 2018-02-11 DIAGNOSIS — Z79899 Other long term (current) drug therapy: Secondary | ICD-10-CM | POA: Diagnosis not present

## 2018-02-11 DIAGNOSIS — R600 Localized edema: Secondary | ICD-10-CM | POA: Diagnosis not present

## 2018-02-11 DIAGNOSIS — R609 Edema, unspecified: Secondary | ICD-10-CM

## 2018-02-11 LAB — COMPREHENSIVE METABOLIC PANEL
ALT: 26 U/L (ref 0–44)
AST: 19 U/L (ref 15–41)
Albumin: 2.9 g/dL — ABNORMAL LOW (ref 3.5–5.0)
Alkaline Phosphatase: 175 U/L — ABNORMAL HIGH (ref 38–126)
Anion gap: 11 (ref 5–15)
BUN: 15 mg/dL (ref 6–20)
CO2: 26 mmol/L (ref 22–32)
Calcium: 8.7 mg/dL — ABNORMAL LOW (ref 8.9–10.3)
Chloride: 103 mmol/L (ref 98–111)
Creatinine, Ser: 1.04 mg/dL — ABNORMAL HIGH (ref 0.44–1.00)
GFR calc Af Amer: >60 mL/min (ref >60)
GFR calc non Af Amer: 59 mL/min — ABNORMAL LOW (ref >60)
Glucose, Bld: 179 mg/dL — ABNORMAL HIGH (ref 70–99)
Potassium: 4.3 mmol/L (ref 3.5–5.1)
Sodium: 140 mmol/L (ref 135–145)
Total Bilirubin: 0.7 mg/dL (ref 0.3–1.2)
Total Protein: 7.3 g/dL (ref 6.5–8.1)

## 2018-02-11 LAB — CBC WITH DIFFERENTIAL/PLATELET
Abs Immature Granulocytes: 0.1 10*3/uL (ref 0.0–0.1)
Basophils Absolute: 0 10*3/uL (ref 0.0–0.1)
Basophils Relative: 0 %
Eosinophils Absolute: 0.1 10*3/uL (ref 0.0–0.7)
Eosinophils Relative: 1 %
HCT: 28.9 % — ABNORMAL LOW (ref 36.0–46.0)
Hemoglobin: 8.6 g/dL — ABNORMAL LOW (ref 12.0–15.0)
Immature Granulocytes: 1 %
Lymphocytes Relative: 15 %
Lymphs Abs: 2 10*3/uL (ref 0.7–4.0)
MCH: 25.1 pg — ABNORMAL LOW (ref 26.0–34.0)
MCHC: 29.8 g/dL — ABNORMAL LOW (ref 30.0–36.0)
MCV: 84.5 fL (ref 78.0–100.0)
Monocytes Absolute: 0.8 10*3/uL (ref 0.1–1.0)
Monocytes Relative: 6 %
Neutro Abs: 9.9 10*3/uL — ABNORMAL HIGH (ref 1.7–7.7)
Neutrophils Relative %: 77 %
Platelets: 336 10*3/uL (ref 150–400)
RBC: 3.42 MIL/uL — ABNORMAL LOW (ref 3.87–5.11)
RDW: 14.7 % (ref 11.5–15.5)
WBC: 12.9 10*3/uL — ABNORMAL HIGH (ref 4.0–10.5)

## 2018-02-11 LAB — I-STAT CG4 LACTIC ACID, ED: Lactic Acid, Venous: 0.87 mmol/L (ref 0.5–1.9)

## 2018-02-11 LAB — BRAIN NATRIURETIC PEPTIDE: B Natriuretic Peptide: 79.1 pg/mL (ref 0.0–100.0)

## 2018-02-11 LAB — D-DIMER, QUANTITATIVE: D-Dimer, Quant: 1.24 ug/mL-FEU — ABNORMAL HIGH (ref 0.00–0.50)

## 2018-02-11 LAB — I-STAT TROPONIN, ED: Troponin i, poc: 0 ng/mL (ref 0.00–0.08)

## 2018-02-11 MED ORDER — IOPAMIDOL (ISOVUE-370) INJECTION 76%
100.0000 mL | Freq: Once | INTRAVENOUS | Status: AC | PRN
  Administered 2018-02-11: 100 mL via INTRAVENOUS

## 2018-02-11 MED ORDER — IOPAMIDOL (ISOVUE-370) INJECTION 76%
INTRAVENOUS | Status: AC
  Filled 2018-02-11: qty 100

## 2018-02-11 NOTE — ED Notes (Signed)
Patient Alert and oriented to baseline. Stable and ambulatory to baseline. Patient verbalized understanding of the discharge instructions.  Patient belongings were taken by the patient.   

## 2018-02-11 NOTE — ED Notes (Signed)
Pt 02 sat 100% on room air @ bedside. During ambulation pt 02 sat decreased to 95%. Pt returned to beside safely. Pt 02 sat increased at 100% on room air after returning to bedside.

## 2018-02-11 NOTE — ED Provider Notes (Signed)
[facility] EMERGENCY DEPARTMENT Provider Note   CSN: [id number] Arrival date & time: 02/11/18  1337     History   Chief Complaint Chief Complaint  Patient presents with  . Congestive Heart Failure    HPI Dana [name] is a 57 y.o. female.  She is complaining of few days of worsening shortness of breath with exertion and also with laying down flat.  She normally uses 2 L of nasal cannula and CPAP at night and not on oxygen during the day.  She is complained of some tightness in her chest.  She also has noticed some pound weight gain and increased fluid on her legs.  She is continue to take her regular same medicines and there is been no changes.  She denies any fever.  No belly pain.  One episode of diarrhea a few days ago otherwise unremarkable.  No urinary symptoms.  The history is provided by the patient.  Congestive Heart Failure  This is a recurrent problem. The current episode started 2 days ago. The problem occurs constantly. The problem has not changed since onset.Associated symptoms include chest pain (heaviness) and shortness of breath. Pertinent negatives include no abdominal pain and no headaches. The symptoms are aggravated by walking and exertion. Nothing relieves the symptoms. She has tried nothing for the symptoms. The treatment provided no relief.    Past Medical History:  Diagnosis Date  . Anginal pain ([place])   . CAD (coronary artery disease)    08/2016 DES to RCA, also with history of LAD stenting.   . Diabetes ([place])   . Diastolic CHF ([place])   . Hyperlipidemia   . Hypertension   . OSA (obstructive sleep apnea)     Patient Active Problem List   Diagnosis Date Noted  . Demand ischemia ([place])   . Hypoglycemia due to insulin 11/07/2017  . Diastolic heart failure ([place]) 11/07/2017  . Hypertensive urgency 11/07/2017  . Obesity, Class III, BMI 40-49.9 (morbid obesity) ([place]) 11/07/2017  . Coronary artery disease involving native coronary artery of  native heart without angina pectoris 12/25/2016  . Orthostatic dizziness 12/25/2016  . Mixed hyperlipidemia 12/14/2016  . Anemia 03/23/2016  . CAD (coronary artery disease) 03/23/2016  . Chronic pain 03/23/2016  . COPD (chronic obstructive pulmonary disease) ([place]) 03/23/2016  . GERD (gastroesophageal reflux disease) 03/23/2016  . HTN (hypertension) 03/23/2016  . Insulin long-term use ([place]) 10/12/2015  . Uncontrolled type 1 diabetes mellitus with diabetic polyneuropathy ([place]) 10/12/2015  . Atrial fibrillation ([place]) 10/05/2015  . S/P ablation of atrial fibrillation 10/04/2015  . Acute renal failure with tubular necrosis ([place]) 08/30/2015  . SIRS (systemic inflammatory response syndrome) ([place]) 08/30/2015  . Orthostatic hypotension 07/27/2015  . Paroxysmal A-fib ([place]) 07/06/2015  . Chest pain 06/14/2015  . Essential hypertension 04/22/2015  . Diabetic peripheral neuropathy associated with type 2 diabetes mellitus ([place]) 04/02/2014  . Bloating 03/01/2013    Past Surgical History:  Procedure Laterality Date  . CARPAL TUNNEL RELEASE    . CESAREAN SECTION    . CHOLECYSTECTOMY    . EYE SURGERY    . RIGHT/LEFT HEART CATH AND CORONARY ANGIOGRAPHY N/A 01/07/2017   Procedure: Right/Left Heart Cath and Coronary Angiography;  Surgeon: Larey Dresser, MD;  Location: [facility];  Service: Cardiovascular;  Laterality: N/A;  . ROTATOR CUFF REPAIR    . TOE AMPUTATION Left      OB History   None      Home Medications  Prior to Admission medications   Medication Sig Start Date End Date Taking? Authorizing Provider  albuterol (ACCUNEB) 0.63 MG/3ML nebulizer solution Inhale 1 ampule by nebulization every six (6) hours as needed for wheezing.    [provider]  albuterol (PROVENTIL HFA;VENTOLIN HFA) 108 (90 Base) MCG/ACT inhaler Inhale 2 puffs into the lungs every 6 (six) hours as needed for wheezing or shortness of breath.     [provider]  carvedilol (COREG)  6.25 MG tablet Take 1 tablet (6.25 mg total) by mouth 2 (two) times daily. 05/16/17 07/15/20  Bensimhon, Shaune Pascal, MD  clopidogrel (PLAVIX) 75 MG tablet Take 75 mg by mouth at bedtime.  08/15/15   [provider]  eszopiclone (LUNESTA) 2 MG TABS tablet Take 2 mg by mouth at bedtime.     [provider]  ferrous sulfate 325 (65 FE) MG tablet Take 325 mg by mouth 2 (two) times daily with a meal.    [provider]  hydrALAZINE (APRESOLINE) 50 MG tablet Take 50 mg by mouth 2 (two) times daily. 12/31/17   [provider]  HYDROcodone-acetaminophen (NORCO) 7.5-325 MG tablet Take 1 tablet by mouth 2 (two) times daily.    [provider]  insulin NPH Human (HUMULIN N,NOVOLIN N) 100 UNIT/ML injection Inject 30 units into the skin two times a day- before breakfast and supper/MDD=60 units 11/21/15   [provider]  insulin regular (NOVOLIN R,HUMULIN R) 100 units/mL injection Inject 7-15 Units into the skin 3 (three) times daily before meals.     [provider]  isosorbide mononitrate (IMDUR) 30 MG 24 hr tablet Take 0.5 tablets (15 mg total) by mouth daily. 01/31/17 11/23/18  Clegg, Amy D, NP  latanoprost (XALATAN) 0.005 % ophthalmic solution Place 1 drop into both eyes at bedtime.    [provider]  metolazone (ZAROXOLYN) 2.5 MG tablet TAKE 1 TABLET BY MOUTH ONCE A WEEK. TAKE EVERY [facility] 01/08/18   Bensimhon, Shaune Pascal, MD  nitroGLYCERIN (NITROSTAT) 0.4 MG SL tablet Place 0.4 mg under the tongue every 5 (five) minutes as needed for chest pain.     [provider]  Omega-3 Fatty Acids (FISH OIL PO) Take 1 capsule by mouth daily.    [provider]  OXYGEN Inhale 2 L into the lungs at bedtime.    [provider]  pravastatin (PRAVACHOL) 40 MG tablet Take 80 mg by mouth at bedtime.     [provider]  Probiotic CAPS Take 1 capsule by mouth daily.    [provider]  promethazine (PHENERGAN) 25 MG  tablet Take 25 mg by mouth every 6 (six) hours as needed for nausea or vomiting.    [provider]  spironolactone (ALDACTONE) 25 MG tablet Take 0.5 tablets (12.5 mg total) by mouth daily. 05/16/17 11/23/18  Bensimhon, Shaune Pascal, MD  torsemide (DEMADEX) 20 MG tablet Take 3 tablets (60 mg total) by mouth daily. Patient taking differently: Take 20 mg by mouth 3 (three) times daily.  11/11/17   Thurnell Lose, MD  Vitamin D, Ergocalciferol, (DRISDOL) 50000 units CAPS capsule Take 50,000 Units by mouth once a week. Every wednesday 11/27/17   [provider]    Family History Family History  Problem Relation Age of Onset  . Hypertension Father     Social History Social History   Tobacco Use  . Smoking status: Former Research scientist (life sciences)  . Smokeless tobacco: Never Used  Substance Use Topics  . Alcohol use: Not Currently  .  Drug use: Not Currently     Allergies   Naproxen; Celecoxib; Aspirin; Glucosamine; Metoclopramide; Metolazone; Shellfish-derived products; and Tramadol   Review of Systems Review of Systems  Constitutional: Negative for chills and fever.  HENT: Negative for ear pain and sore throat.   Eyes: Negative for pain and visual disturbance.  Respiratory: Positive for shortness of breath. Negative for cough.   Cardiovascular: Positive for chest pain (heaviness) and leg swelling. Negative for palpitations.  Gastrointestinal: Negative for abdominal pain and vomiting.  Genitourinary: Negative for dysuria and hematuria.  Musculoskeletal: Negative for arthralgias and back pain.  Skin: Negative for color change and rash.  Neurological: Negative for seizures, syncope and headaches.  All other systems reviewed and are negative.    Physical Exam Updated Vital Signs BP (!) 174/73 (BP Location: Right Arm)   Pulse 83   Temp 98.3 F (36.8 C) (Oral)   Resp 16   SpO2 96%   Physical Exam  Constitutional: She appears well-developed and well-nourished. No distress.  HENT:   Head: Normocephalic and atraumatic.  Right Ear: External ear normal.  Left Ear: External ear normal.  Nose: Nose normal.  Mouth/Throat: Oropharynx is clear and moist.  Eyes: Conjunctivae are normal.  Neck: Neck supple.  Cardiovascular: Normal rate, regular rhythm and normal heart sounds.  No murmur heard. Pulmonary/Chest: Effort normal and breath sounds normal. No respiratory distress. She has no wheezes. She has no rales.  Abdominal: Soft. She exhibits no mass. There is no tenderness. There is no rebound.  Musculoskeletal: Normal range of motion. She exhibits edema. She exhibits no deformity.  Neurological: She is alert.  Skin: Skin is warm and dry. Capillary refill takes less than 2 seconds.  Psychiatric: She has a normal mood and affect.  Nursing note and vitals reviewed.    ED Treatments / Results  Labs (all labs ordered are listed, but only abnormal results are displayed) Labs Reviewed  CBC WITH DIFFERENTIAL/PLATELET - Abnormal; Notable for the following components:      Result Value   WBC 12.9 (*)    RBC 3.42 (*)    Hemoglobin 8.6 (*)    HCT 28.9 (*)    MCH 25.1 (*)    MCHC 29.8 (*)    Neutro Abs 9.9 (*)    All other components within normal limits  COMPREHENSIVE METABOLIC PANEL - Abnormal; Notable for the following components:   Glucose, Bld 179 (*)    Creatinine, Ser 1.04 (*)    Calcium 8.7 (*)    Albumin 2.9 (*)    Alkaline Phosphatase 175 (*)    GFR calc non Af Amer 59 (*)    All other components within normal limits  CULTURE, BLOOD (ROUTINE X 2)  CULTURE, BLOOD (ROUTINE X 2)  BRAIN NATRIURETIC PEPTIDE  I-STAT TROPONIN, ED  I-STAT CG4 LACTIC ACID, ED    EKG EKG Interpretation  Date/Time:  Tuesday February 11 2018 13:52:32 EDT Ventricular Rate:  86 PR Interval:  146 QRS Duration: 74 QT Interval:  376 QTC Calculation: 449 R Axis:   36 Text Interpretation:  Normal sinus rhythm with sinus arrhythmia Normal ECG similar to prior 6/19 Confirmed by Aletta Edouard [phone]) on 02/11/2018 4:34:26 PM   Radiology Dg Chest 2 View  Result Date: 02/11/2018 CLINICAL DATA:  Short of breath chest pain EXAM: CHEST - 2 VIEW COMPARISON:  11/19/2017 FINDINGS: Cardiac enlargement without heart failure or edema. No effusion. Mild left lower lobe atelectasis/infiltrate has developed since the prior study. IMPRESSION: Mild left  lower lobe atelectasis/infiltrate. Electronically Signed   By: Franchot Gallo M.D.   On: 02/11/2018 15:02   Ct Angio Chest Pe W/cm &/or Wo Cm  Result Date: 02/11/2018 CLINICAL DATA:  Positive D-dimer short of breath. Rule out pulmonary embolism EXAM: CT ANGIOGRAPHY CHEST WITH CONTRAST TECHNIQUE: Multidetector CT imaging of the chest was performed using the standard protocol during bolus administration of intravenous contrast. Multiplanar CT image reconstructions and MIPs were obtained to evaluate the vascular anatomy. CONTRAST:  144mL ISOVUE-370 IOPAMIDOL (ISOVUE-370) INJECTION 76% COMPARISON:  Chest two-view 02/11/2018, CTA chest 08/27/2016 FINDINGS: Cardiovascular: Negative for pulmonary embolism. Cardiac enlargement with coronary artery calcification. Distal right coronary artery stent. No pericardial effusion. Normal thoracic aorta. Mediastinum/Nodes: Negative for mass or adenopathy. Normal esophagus. Lungs/Pleura: Mild right middle lobe atelectasis. Mild lingular atelectasis. Negative for pneumonia. No mass or effusion. Upper Abdomen: Negative Musculoskeletal: No acute abnormality. Review of the MIP images confirms the above findings. IMPRESSION: Negative for pulmonary embolism.  No acute abnormality in the chest Coronary artery disease. Electronically Signed   By: Franchot Gallo M.D.   On: 02/11/2018 19:01    Procedures Procedures (including critical care time)  Medications Ordered in ED Medications  iopamidol (ISOVUE-370) 76 % injection 100 mL (100 mLs Intravenous Contrast Given 02/11/18 1844)     Initial Impression / Assessment and  Plan / ED Course  I have reviewed the triage vital signs and the nursing notes.  Pertinent labs & imaging results that were available during my care of the patient were reviewed by me and considered in my medical decision making (see chart for details).  Clinical Course as of Feb 12 1157  Tue Feb 11, 2018  1559 Patient's story is more increased shortness of breath likely due to CHF.  She has got no rales on exam and her pulse ox 96% on room air.  She does have significant lower extremity edema.  Her BNP is normal at 79 and her first troponin negative.  Her chest x-ray is being read as infiltrate versus atelectasis.  She does have an elevated white count.  We are drawing blood cultures and checking a lactate.   [MB]  4315 Patient had a dry cough but really nonproductive.  Denies any fevers.   [MB]  4008 Echo 4/19 - Study Conclusions  - Left ventricle: The cavity size was normal. Wall thickness was   increased in a pattern of mild LVH. Systolic function was   vigorous. The estimated ejection fraction was in the range of 65%   to 70%. Wall motion was normal; there were no regional wall   motion abnormalities. Doppler parameters are consistent with   pseudonormal left ventricular relaxation (grade 2 diastolic   dysfunction). The E/e&' ratio is >20, suggesting markedly elevated   LV filling pressure. - Mitral valve: Calcified annulus. Mildly thickened leaflets .   There was trivial regurgitation. - Left atrium: The atrium was mildly dilated. - Inferior vena cava: The vessel was normal in size. The   respirophasic diameter changes were in the normal range (>= 50%),   consistent with normal central venous pressure.  Impressions:  - LVEF 65-70%, mild LVH, normal wall motion, grade 2 DD, elevated   LV Filling pressure, trivial MR, mild LAE, normal IVC.    [MB]  1922 Patient CT shows no evidence of PE and no obvious pneumonia.  I went back to talk to her she is comfortable with increasing  her diuretic and see if that helps get some of this  extra fluid off for.  She had a trending pulse ox and stayed above 95%.   [MB]    Clinical Course User Index [MB] Hayden Rasmussen, MD    Final Clinical Impressions(s) / ED Diagnoses   Final diagnoses:  SOB (shortness of breath)  Peripheral edema  Swelling    ED Discharge Orders    None       Hayden Rasmussen, MD 02/12/18 1158

## 2018-02-11 NOTE — ED Triage Notes (Signed)
Pt reports shortness of breath X3 days. She reports increased SOB since last night, worse with lying down. No distress noted in triage. Swelling noted to her legs.

## 2018-02-11 NOTE — ED Provider Notes (Signed)
Patient placed in Quick Look pathway, seen and evaluated   Chief Complaint: sob  HPI:   Increasing sob and orthopnea for the pat 3 days. Worsened sob last night at 3pm. Normally sleeps on 2 pillows, had to sleep on the recliner. Associated bilateral leg swelling. Gained 7 lbs in 3 days. Takes metolazone weekly (or biweekly) as needed, last dose Wed. Take torsemide 20 mg TID, last dose 9 AM this morning. Pt states she is having difficulty going up the 25 stairs to her bedroom. Has a h/o dm, htn, stents, chf, afib resolved with ablation.   ROS: sob  Physical Exam:   Gen: No distress  Neuro: Awake and Alert  Skin: Warm    Focused Exam: RRR. Clear lungs without crackles. Bilateral leg swelling with 2+ pitting edema.   Labs, ekg, and cxr ordered  Initiation of care has begun. The patient has been counseled on the process, plan, and necessity for staying for the completion/evaluation, and the remainder of the medical screening examination    Franchot Heidelberg, PA-C 02/11/18 1412    Tegeler, Gwenyth Allegra, MD 02/11/18 [phone]

## 2018-02-11 NOTE — Discharge Instructions (Addendum)
Your evaluated in the emergency department for increased shortness of breath and increased peripheral edema.  Your blood test did not show you were having obvious congestive heart failure.  We also did a CAT scan that did not show any obvious pneumonia or blood clot.  We want you to take your metolazone tomorrow and Thursday and follow-up in the cardiology clinic on Friday with CHF team.  Please also watch your salt and fluid intake.  Return if any concerns.

## 2018-02-13 NOTE — Progress Notes (Signed)
ADVANCED HF CLINIC  NOTE  Referring Physician: Dr Bettina Gavia  Primary Care: Dr Tobie Poet in [place] Primary Cardiologist: Dr Bettina Gavia  Pulmonary: Dr Alcide Clever  HPI: Dana [name] is a 57 y.o. female with a history of obesity, diastolic heart failure, DMI, neuropathy, HTN, hyperlipidemia, CAD (most recent [facility] 08/2016 DES RCA and stent LAD total of 4 stents), anemia, OSA --> started CPAP 12/2016, PAF S/P ablation, PAD S/P L great toe and 4th toe amputation.   Admitted 4/24 - 11/11/17 with chest pain while getting up out of her recliner.  EKG unremarkable. CXR with pulmonary edema. Troponin  0.03 - 0.05. Diuresed with IV lasix with resolution of symptoms. Echo with normal EF as below. Torsemide cut back to 60 mg daily on discharge.   On June 25th she was evaluated in the ED for syncope. She was in her usual state with no complaints. She was standing at the sink and had high pitch sound in her ears. Her husband found her on kitchen floor. She was evaluate in and later sent home.   [place] she retuns for HF follow up. Evaluated in the ED last week  with increased shortness of breath and volume overload. Overall feeling fine. SOB with exertion. Denies PND. +Orthopnea. Denies dizziness. Denies syncope. Appetite ok. No fever or chills. Weight at home has been trending up 250-->258 pounds. She took and extra metolazone yesterday and her weight went down 2 pounds. Tries to follow low salt diet and limit fluids to < 2 liters per day. Continue nightly CPAP. aking all medications.  - Echo 11/07/17 LVEF 65-70%, Grade 2 DD. Trivial MR, Mild LAE. -ECHO 08/2016 EF 55% RV was not clearly visualized  Studies RHC/LHC 01/07/2017  Stents patent RA mean 7 RV 31/6 PA 31/10, mean 20 PCWP mean 7 LV 142/12 AO 134/64 Oxygen saturations: PA 66% AO 99% Cardiac Output (Fick) 7.31  Cardiac Index (Fick) 3.37 Left Main  No significant disease.  Left Anterior Descending  30% ostial stenosis. 30-40% proximal stenosis. Patent mid  LAD stent. 40-50% mid LAD stenosis distal to stent.  Left Circumflex  Large OM1. 30% mid LCx stenosis after OM1. Moderate OM2 with 30-40% proximal stenosis. 30% distal LCx stenosis.  Right Coronary Artery  Serial 30-40% stenoses in the proximal RCA. Long stented segment in the PDA was patent.    Cardiac cath [facility] 08/30/16:  2. Severe stenosis of the distal RCA extending into the rPDA. 3. Patent stent in the LAD. 4. Moderate macrovascular disease elsewhere. 5. Diffuse microvascular disease seen. 6. Mild pulmonary hypertension. 7. Normal LVEDP. 8. Normal cardiac output. Interventional Summary Successful PCI / Xience Drug Eluting Stent (2.75/23 mm) of the distal Right RA 7 PCW 14 CO/CO 6.3/3  CTA 08/2016 --. No PE    Review of systems complete and found to be negative unless listed in HPI.    Past Medical History:  Diagnosis Date  . Anginal pain ([place])   . CAD (coronary artery disease)    08/2016 DES to RCA, also with history of LAD stenting.   . Diabetes ([place])   . Diastolic CHF ([place])   . Hyperlipidemia   . Hypertension   . OSA (obstructive sleep apnea)     Current Outpatient Medications  Medication Sig Dispense Refill  . albuterol (ACCUNEB) 0.63 MG/3ML nebulizer solution Inhale 1 ampule by nebulization every six (6) hours as needed for wheezing.    Marland Kitchen albuterol (PROVENTIL HFA;VENTOLIN HFA) 108 (90 Base) MCG/ACT inhaler Inhale 2 puffs into the lungs  every 6 (six) hours as needed for wheezing or shortness of breath.     . carvedilol (COREG) 6.25 MG tablet Take 1 tablet (6.25 mg total) by mouth 2 (two) times daily. 60 tablet 3  . clopidogrel (PLAVIX) 75 MG tablet Take 75 mg by mouth at bedtime.     . eszopiclone (LUNESTA) 2 MG TABS tablet Take 2 mg by mouth at bedtime.     . ferrous sulfate 325 (65 FE) MG tablet Take 325 mg by mouth 2 (two) times daily with a meal.    . hydrALAZINE (APRESOLINE) 50 MG tablet Take 50 mg by mouth 2 (two) times daily.  3  .  HYDROcodone-acetaminophen (NORCO) 7.5-325 MG tablet Take 1 tablet by mouth 2 (two) times daily.    . insulin NPH Human (HUMULIN N,NOVOLIN N) 100 UNIT/ML injection Inject 30 units into the skin two times a day- before breakfast and supper/MDD=60 units    . insulin regular (NOVOLIN R,HUMULIN R) 100 units/mL injection Inject 7-15 Units into the skin 3 (three) times daily before meals.     . isosorbide mononitrate (IMDUR) 30 MG 24 hr tablet Take 0.5 tablets (15 mg total) by mouth daily. 15 tablet 6  . latanoprost (XALATAN) 0.005 % ophthalmic solution Place 1 drop into both eyes at bedtime.    . metolazone (ZAROXOLYN) 2.5 MG tablet TAKE 1 TABLET BY MOUTH ONCE A WEEK. TAKE EVERY WEDNESDAY 10 tablet 4  . nitroGLYCERIN (NITROSTAT) 0.4 MG SL tablet Place 0.4 mg under the tongue every 5 (five) minutes as needed for chest pain.     . Omega-3 Fatty Acids (FISH OIL PO) Take 1 capsule by mouth daily.    . OXYGEN Inhale 2 L into the lungs at bedtime.    . pravastatin (PRAVACHOL) 40 MG tablet Take 80 mg by mouth at bedtime.     . Probiotic CAPS Take 1 capsule by mouth daily.    . promethazine (PHENERGAN) 25 MG tablet Take 25 mg by mouth every 6 (six) hours as needed for nausea or vomiting.    Marland Kitchen spironolactone (ALDACTONE) 25 MG tablet Take 0.5 tablets (12.5 mg total) by mouth daily. 15 tablet 3  . torsemide (DEMADEX) 20 MG tablet Take 20 mg by mouth 3 (three) times daily.    . Vitamin D, Ergocalciferol, (DRISDOL) 50000 units CAPS capsule Take 50,000 Units by mouth once a week. Every wednesday     No current facility-administered medications for this encounter.     Allergies  Allergen Reactions  . Naproxen Hives and Rash  . Celecoxib Other (See Comments)    Stomach pain  . Aspirin Other (See Comments)    CAN NOT TAKE DUE TO ULCERS  . Glucosamine Hives, Swelling and Other (See Comments)    Angioedema   . Metoclopramide Other (See Comments)    Facial twitching and stuttering  . Metolazone Other (See  Comments)    Acute renal failure  . Shellfish-Derived Products Hives, Swelling and Other (See Comments)    Angioedema   . Tramadol Nausea And Vomiting and Other (See Comments)           Social History   Socioeconomic History  . Marital status: Married    Spouse name: Not on file  . Number of children: Not on file  . Years of education: Not on file  . Highest education level: Not on file  Occupational History  . Not on file  Social Needs  . Financial resource strain: Not on file  .  Food insecurity:    Worry: Not on file    Inability: Not on file  . Transportation needs:    Medical: Not on file    Non-medical: Not on file  Tobacco Use  . Smoking status: Former Research scientist (life sciences)  . Smokeless tobacco: Never Used  Substance and Sexual Activity  . Alcohol use: Not Currently  . Drug use: Not Currently  . Sexual activity: Not on file  Lifestyle  . Physical activity:    Days per week: Not on file    Minutes per session: Not on file  . Stress: Not on file  Relationships  . Social connections:    Talks on phone: Not on file    Gets together: Not on file    Attends religious service: Not on file    Active member of club or organization: Not on file    Attends meetings of clubs or organizations: Not on file    Relationship status: Not on file  . Intimate partner violence:    Fear of current or ex partner: Not on file    Emotionally abused: Not on file    Physically abused: Not on file    Forced sexual activity: Not on file  Other Topics Concern  . Not on file  Social History Narrative  . Not on file    FamHx:  Denies h/o familial CM or SCD. Multiple family members with CAD.   Vitals:   02/14/18 0837  BP: (!) 176/82  Pulse: 82  SpO2: 98%  Weight: 256 lb (116.1 kg)   Wt Readings from Last 3 Encounters:  02/14/18 256 lb (116.1 kg)  11/22/17 251 lb 8 oz (114.1 kg)  11/11/17 249 lb 8 oz (113.2 kg)    PHYSICAL EXAM: General:  NAD. No resp difficulty HEENT:  normal Neck: supple. JVP 11-12 . Carotids 2+ bilat; no bruits. No lymphadenopathy or thryomegaly appreciated. Cor: PMI nondisplaced. Regular rate & rhythm. No rubs, gallops or murmurs. Lungs: clear Abdomen: soft, nontender, nondistended. No hepatosplenomegaly. No bruits or masses. Good bowel sounds. Extremities: no cyanosis, clubbing, rash, R and LLE 2+ edema Neuro: alert & orientedx3, cranial nerves grossly intact. moves all 4 extremities w/o difficulty. Affect pleasant  EKG: NSR 75 bpm   ASSESSMENT & PLAN:  1. Chronic Diastolic Heart Failure-  - Echo 11/07/17 LVEF 65-70%, Grade 2 DD - NYHA IIIb. Volume status elevated. Increase morning torsemide to 40 mg and continue 20 mg in the afternoon and the evening. -  Continue metolazone 2.5 mg on Wednesday.  - Continue coreg 6.25 mg BID.  - Increase  spiro 25 mg daily. - Reinforced low salt food choices, and limiting fluid intake to < 2 liters per day.   - She would benefit from [place]. Hospitalized in April of this year with A/C Diastolic Heart Failure.  -Check BMET/BNP today. 2. Obesity - Body mass index is 43.94 kg/m.  - Discussed portion control.    3. OSA -Continue CPAP  4. CAD - 4 stents. 08/2016  stent RCA.  - Recent LHC nonobstructive.  - On plavix, bb, and statin.  -No S/S ischemia.   5. PAF - S/P ablation 2016. - Maintaining SNR. Regular on exam. EKG 11/06/17 with NSR.  - Stressed compliance with CPAP - Holter monitor 06/2017 with occasional PACs and SVT.  - BB as above.  7. HTN:  - Elevated. Increase spiro as above,  8. CKD III - Baseline creatinine appears to be 1.3 - 1.5 -Check BMET  9. Syncope  Unexplained syncope in June 2019. Does not sound like vasovagal or orthostasis.  She understands  she cant drive for 6 months.  Place 30 day Zio patch to assess for arrhythmias.   Follow up next week for Ziopatch and follow up on Cardiomems. Would like to set up Cardiomems ASAP.    Darrick Grinder, NP  8:43 AM

## 2018-02-14 ENCOUNTER — Ambulatory Visit (HOSPITAL_COMMUNITY)
Admission: RE | Admit: 2018-02-14 | Discharge: 2018-02-14 | Disposition: A | Discharge status: Home / Self Care | Payer: Medicare Other | Source: Ambulatory Visit | Attending: Cardiology | Admitting: Cardiology

## 2018-02-14 VITALS — BP 176/82 | HR 82 | Wt 256.0 lb

## 2018-02-14 DIAGNOSIS — R55 Syncope and collapse: Secondary | ICD-10-CM | POA: Diagnosis not present

## 2018-02-14 DIAGNOSIS — Z87891 Personal history of nicotine dependence: Secondary | ICD-10-CM | POA: Diagnosis not present

## 2018-02-14 DIAGNOSIS — Z794 Long term (current) use of insulin: Secondary | ICD-10-CM | POA: Insufficient documentation

## 2018-02-14 DIAGNOSIS — N183 Chronic kidney disease, stage 3 unspecified: Secondary | ICD-10-CM

## 2018-02-14 DIAGNOSIS — I5032 Chronic diastolic (congestive) heart failure: Secondary | ICD-10-CM | POA: Diagnosis not present

## 2018-02-14 DIAGNOSIS — I5030 Unspecified diastolic (congestive) heart failure: Secondary | ICD-10-CM

## 2018-02-14 DIAGNOSIS — Z79899 Other long term (current) drug therapy: Secondary | ICD-10-CM | POA: Insufficient documentation

## 2018-02-14 DIAGNOSIS — E1022 Type 1 diabetes mellitus with diabetic chronic kidney disease: Secondary | ICD-10-CM | POA: Insufficient documentation

## 2018-02-14 DIAGNOSIS — E785 Hyperlipidemia, unspecified: Secondary | ICD-10-CM | POA: Insufficient documentation

## 2018-02-14 DIAGNOSIS — Z6841 Body Mass Index (BMI) 40.0 and over, adult: Secondary | ICD-10-CM | POA: Insufficient documentation

## 2018-02-14 DIAGNOSIS — I2510 Atherosclerotic heart disease of native coronary artery without angina pectoris: Secondary | ICD-10-CM | POA: Insufficient documentation

## 2018-02-14 DIAGNOSIS — Z955 Presence of coronary angioplasty implant and graft: Secondary | ICD-10-CM | POA: Diagnosis not present

## 2018-02-14 DIAGNOSIS — E669 Obesity, unspecified: Secondary | ICD-10-CM | POA: Diagnosis not present

## 2018-02-14 DIAGNOSIS — I480 Paroxysmal atrial fibrillation: Secondary | ICD-10-CM | POA: Insufficient documentation

## 2018-02-14 DIAGNOSIS — G4733 Obstructive sleep apnea (adult) (pediatric): Secondary | ICD-10-CM | POA: Insufficient documentation

## 2018-02-14 DIAGNOSIS — Z7902 Long term (current) use of antithrombotics/antiplatelets: Secondary | ICD-10-CM | POA: Diagnosis not present

## 2018-02-14 DIAGNOSIS — I130 Hypertensive heart and chronic kidney disease with heart failure and stage 1 through stage 4 chronic kidney disease, or unspecified chronic kidney disease: Secondary | ICD-10-CM | POA: Insufficient documentation

## 2018-02-14 LAB — BASIC METABOLIC PANEL
Anion gap: 8 (ref 5–15)
BUN: 20 mg/dL (ref 6–20)
CO2: 26 mmol/L (ref 22–32)
Calcium: 8.7 mg/dL — ABNORMAL LOW (ref 8.9–10.3)
Chloride: 103 mmol/L (ref 98–111)
Creatinine, Ser: 1.16 mg/dL — ABNORMAL HIGH (ref 0.44–1.00)
GFR calc Af Amer: >60 mL/min (ref >60)
GFR calc non Af Amer: 52 mL/min — ABNORMAL LOW (ref >60)
Glucose, Bld: 246 mg/dL — ABNORMAL HIGH (ref 70–99)
Potassium: 4.3 mmol/L (ref 3.5–5.1)
Sodium: 137 mmol/L (ref 135–145)

## 2018-02-14 LAB — BRAIN NATRIURETIC PEPTIDE: B Natriuretic Peptide: 51.5 pg/mL (ref 0.0–100.0)

## 2018-02-14 MED ORDER — TORSEMIDE 20 MG PO TABS
ORAL_TABLET | ORAL | 3 refills | Status: DC
Start: 2018-02-14 — End: 2018-03-18

## 2018-02-14 MED ORDER — SPIRONOLACTONE 25 MG PO TABS: 25.0000 mg | ORAL_TABLET | Freq: Every day | ORAL | 3 refills | Status: DC

## 2018-02-14 NOTE — Patient Instructions (Signed)
INCREASE Torsemide to 40 mg in the AM, 20 mg and lunch and 20 mg in the evening INCREASE Spironolactone 25 mg, one tab daily  Labs today We will only contact you if something comes back abnormal or we need to make some changes. Otherwise no news is good news!  Your physician has recommended that you wear an event monitor. Event monitors are medical devices that record the heart's electrical activity. Doctors most often [name] these monitors to diagnose arrhythmias. Arrhythmias are problems with the speed or rhythm of the heartbeat. The monitor is a small, portable device. You can wear one while you do your normal daily activities. This is usually used to diagnose what is causing palpitations/syncope (passing out).  Your physician recommends that you schedule a follow-up appointment in: 2 weeks

## 2018-02-16 LAB — CULTURE, BLOOD (ROUTINE X 2)
Culture: NO GROWTH
Culture: NO GROWTH
Special Requests: ADEQUATE

## 2018-02-19 ENCOUNTER — Encounter (HOSPITAL_COMMUNITY): Payer: Medicare Other

## 2018-02-19 ENCOUNTER — Ambulatory Visit (HOSPITAL_COMMUNITY)
Admission: RE | Admit: 2018-02-19 | Discharge: 2018-02-19 | Disposition: A | Discharge status: Home / Self Care | Payer: Medicare Other | Source: Ambulatory Visit | Attending: Adult Health | Admitting: Adult Health

## 2018-02-19 DIAGNOSIS — R55 Syncope and collapse: Secondary | ICD-10-CM

## 2018-02-25 DIAGNOSIS — R5383 Other fatigue: Secondary | ICD-10-CM | POA: Diagnosis not present

## 2018-02-25 DIAGNOSIS — E1142 Type 2 diabetes mellitus with diabetic polyneuropathy: Secondary | ICD-10-CM | POA: Diagnosis not present

## 2018-02-25 DIAGNOSIS — E038 Other specified hypothyroidism: Secondary | ICD-10-CM | POA: Diagnosis not present

## 2018-02-25 DIAGNOSIS — Z6839 Body mass index (BMI) 39.0-39.9, adult: Secondary | ICD-10-CM | POA: Diagnosis not present

## 2018-02-25 DIAGNOSIS — M7918 Myalgia, other site: Secondary | ICD-10-CM | POA: Diagnosis not present

## 2018-02-25 DIAGNOSIS — E782 Mixed hyperlipidemia: Secondary | ICD-10-CM | POA: Diagnosis not present

## 2018-02-25 DIAGNOSIS — I5022 Chronic systolic (congestive) heart failure: Secondary | ICD-10-CM | POA: Diagnosis not present

## 2018-02-25 DIAGNOSIS — R0602 Shortness of breath: Secondary | ICD-10-CM | POA: Diagnosis not present

## 2018-02-25 DIAGNOSIS — I119 Hypertensive heart disease without heart failure: Secondary | ICD-10-CM | POA: Diagnosis not present

## 2018-02-26 DIAGNOSIS — J301 Allergic rhinitis due to pollen: Secondary | ICD-10-CM | POA: Diagnosis not present

## 2018-02-26 DIAGNOSIS — J4520 Mild intermittent asthma, uncomplicated: Secondary | ICD-10-CM | POA: Diagnosis not present

## 2018-02-26 DIAGNOSIS — G4733 Obstructive sleep apnea (adult) (pediatric): Secondary | ICD-10-CM | POA: Diagnosis not present

## 2018-02-27 NOTE — H&P (View-Only) (Signed)
ADVANCED HF CLINIC  NOTE  Referring Physician: Dr Bettina Gavia  Primary Care: Dr Tobie Poet in [place] Primary Cardiologist: Dr Bettina Gavia  Pulmonary: Dr Alcide Clever HF: Dr [name]  HPI: Dana [name] is a 57 y.o. female with a history of obesity, diastolic heart failure, DMI, neuropathy, HTN, hyperlipidemia, CAD (most recent [facility] 08/2016 DES RCA and stent LAD total of 4 stents), anemia, OSA --> started CPAP 12/2016, PAF S/P ablation, PAD S/P L great toe and 4th toe amputation.   Admitted 4/24 - 11/11/17 with chest pain while getting up out of her recliner.  EKG unremarkable. CXR with pulmonary edema. Troponin  0.03 - 0.05. Diuresed with IV lasix with resolution of symptoms. Echo with normal EF as below. Torsemide cut back to 60 mg daily on discharge.   On June 25th she was evaluated in the ED for syncope. She was in her usual state with no complaints. She was standing at the sink and had high pitch sound in her ears. Her husband found her on kitchen floor. She was evaluated in ED and later sent home.   Today she returns for HF follow up. Last visit, torsemide and spiro were increased. Ziopatch was placed to assess for arrhythmias with recent syncope. Overall doing much better. She is only SOB when walking quickly or going up stairs. Activity is more limited by back pain than SOB. Denies edema. Has chronic orthopnea. No dizziness or syncope. No bleeding on plavix. Has not taken PRN metolazone. No CP. She has palpitations when being intimate with her husband. Wearing ziopatch now through 8/21. Wearing CPAP qHS. She is waiting to hear about scheduling her cardiomems. Limits fluid and salt intake. Taking all medications. Weights have gone down from 256 to 244 lbs on her home scale.   - Echo 11/07/17 LVEF 65-70%, Grade 2 DD. Trivial MR, Mild LAE. - ECHO 08/2016 EF 55% RV was not clearly visualized  Studies RHC/LHC 01/07/2017  Stents patent RA mean 7 RV 31/6 PA 31/10, mean 20 PCWP mean 7 LV 142/12 AO  134/64 Oxygen saturations: PA 66% AO 99% Cardiac Output (Fick) 7.31  Cardiac Index (Fick) 3.37 Left Main  No significant disease.  Left Anterior Descending  30% ostial stenosis. 30-40% proximal stenosis. Patent mid LAD stent. 40-50% mid LAD stenosis distal to stent.  Left Circumflex  Large OM1. 30% mid LCx stenosis after OM1. Moderate OM2 with 30-40% proximal stenosis. 30% distal LCx stenosis.  Right Coronary Artery  Serial 30-40% stenoses in the proximal RCA. Long stented segment in the PDA was patent.    Cardiac cath [facility] 08/30/16:  2. Severe stenosis of the distal RCA extending into the rPDA. 3. Patent stent in the LAD. 4. Moderate macrovascular disease elsewhere. 5. Diffuse microvascular disease seen. 6. Mild pulmonary hypertension. 7. Normal LVEDP. 8. Normal cardiac output. Interventional Summary Successful PCI / Xience Drug Eluting Stent (2.75/23 mm) of the distal Right RA 7 PCW 14 CO/CO 6.3/3  CTA 08/2016 --. No PE    Review of systems complete and found to be negative unless listed in HPI.    Past Medical History:  Diagnosis Date  . Anginal pain ([place])   . CAD (coronary artery disease)    08/2016 DES to RCA, also with history of LAD stenting.   . Diabetes ([place])   . Diastolic CHF ([place])   . Hyperlipidemia   . Hypertension   . OSA (obstructive sleep apnea)     Current Outpatient Medications  Medication Sig Dispense Refill  . albuterol (  ACCUNEB) 0.63 MG/3ML nebulizer solution Inhale 1 ampule by nebulization every six (6) hours as needed for wheezing.    Marland Kitchen albuterol (PROVENTIL HFA;VENTOLIN HFA) 108 (90 Base) MCG/ACT inhaler Inhale 2 puffs into the lungs every 6 (six) hours as needed for wheezing or shortness of breath.     . carvedilol (COREG) 6.25 MG tablet Take 1 tablet (6.25 mg total) by mouth 2 (two) times daily. 60 tablet 3  . clopidogrel (PLAVIX) 75 MG tablet Take 75 mg by mouth at bedtime.     . eszopiclone (LUNESTA) 2 MG TABS tablet Take 2 mg by mouth at  bedtime.     . ferrous sulfate 325 (65 FE) MG tablet Take 325 mg by mouth 2 (two) times daily with a meal.    . hydrALAZINE (APRESOLINE) 50 MG tablet Take 50 mg by mouth 2 (two) times daily.  3  . HYDROcodone-acetaminophen (NORCO) 7.5-325 MG tablet Take 1 tablet by mouth 2 (two) times daily.    . insulin NPH Human (HUMULIN N,NOVOLIN N) 100 UNIT/ML injection Inject 30 units into the skin two times a day- before breakfast and supper/MDD=60 units    . insulin regular (NOVOLIN R,HUMULIN R) 100 units/mL injection Inject 7-15 Units into the skin 3 (three) times daily before meals.     . isosorbide mononitrate (IMDUR) 30 MG 24 hr tablet Take 0.5 tablets (15 mg total) by mouth daily. 15 tablet 6  . latanoprost (XALATAN) 0.005 % ophthalmic solution Place 1 drop into both eyes at bedtime.    . metolazone (ZAROXOLYN) 2.5 MG tablet TAKE 1 TABLET BY MOUTH ONCE A WEEK. TAKE EVERY WEDNESDAY 10 tablet 4  . nitroGLYCERIN (NITROSTAT) 0.4 MG SL tablet Place 0.4 mg under the tongue every 5 (five) minutes as needed for chest pain.     . Omega-3 Fatty Acids (FISH OIL PO) Take 1 capsule by mouth daily.    . OXYGEN Inhale 2 L into the lungs at bedtime.    . pravastatin (PRAVACHOL) 40 MG tablet Take 80 mg by mouth at bedtime.     . Probiotic CAPS Take 1 capsule by mouth daily.    . promethazine (PHENERGAN) 25 MG tablet Take 25 mg by mouth every 6 (six) hours as needed for nausea or vomiting.    Marland Kitchen spironolactone (ALDACTONE) 25 MG tablet Take 1 tablet (25 mg total) by mouth daily. 30 tablet 3  . torsemide (DEMADEX) 20 MG tablet Take 2 tablets (40 mg total) by mouth every morning AND 1 tablet (20 mg total) daily with lunch AND 1 tablet (20 mg total) every evening. 120 tablet 3  . Vitamin D, Ergocalciferol, (DRISDOL) 50000 units CAPS capsule Take 50,000 Units by mouth once a week. Every wednesday     No current facility-administered medications for this encounter.     Allergies  Allergen Reactions  . Naproxen Hives and  Rash  . Celecoxib Other (See Comments)    Stomach pain  . Aspirin Other (See Comments)    CAN NOT TAKE DUE TO ULCERS  . Glucosamine Hives, Swelling and Other (See Comments)    Angioedema   . Metoclopramide Other (See Comments)    Facial twitching and stuttering  . Metolazone Other (See Comments)    Acute renal failure  . Shellfish-Derived Products Hives, Swelling and Other (See Comments)    Angioedema   . Tramadol Nausea And Vomiting and Other (See Comments)           Social History   Socioeconomic History  .  Marital status: Married    Spouse name: Not on file  . Number of children: Not on file  . Years of education: Not on file  . Highest education level: Not on file  Occupational History  . Not on file  Social Needs  . Financial resource strain: Not on file  . Food insecurity:    Worry: Not on file    Inability: Not on file  . Transportation needs:    Medical: Not on file    Non-medical: Not on file  Tobacco Use  . Smoking status: Former Research scientist (life sciences)  . Smokeless tobacco: Never Used  Substance and Sexual Activity  . Alcohol use: Not Currently  . Drug use: Not Currently  . Sexual activity: Not on file  Lifestyle  . Physical activity:    Days per week: Not on file    Minutes per session: Not on file  . Stress: Not on file  Relationships  . Social connections:    Talks on phone: Not on file    Gets together: Not on file    Attends religious service: Not on file    Active member of club or organization: Not on file    Attends meetings of clubs or organizations: Not on file    Relationship status: Not on file  . Intimate partner violence:    Fear of current or ex partner: Not on file    Emotionally abused: Not on file    Physically abused: Not on file    Forced sexual activity: Not on file  Other Topics Concern  . Not on file  Social History Narrative  . Not on file    FamHx:  Denies h/o familial CM or SCD. Multiple family members with CAD.   Vitals:     02/28/18 0953  BP: 134/68  Pulse: 86  SpO2: 98%  Weight: 112.6 kg (248 lb 4 oz)   Wt Readings from Last 3 Encounters:  02/28/18 112.6 kg (248 lb 4 oz)  02/14/18 116.1 kg (256 lb)  11/22/17 114.1 kg (251 lb 8 oz)    PHYSICAL EXAM: General:  No resp difficulty. HEENT: Normal Neck: Supple. JVP difficult but does not appear elevated. Carotids 2+ bilat; no bruits. No thyromegaly or nodule noted. Cor: PMI nondisplaced. RRR, No M/G/R noted Lungs: CTAB, normal effort. Abdomen: Soft, non-tender, non-distended, no HSM. No bruits or masses. +BS  Extremities: No cyanosis, clubbing, or rash. R and LLE no edema. Vascular changes on RLE  Neuro: Alert & orientedx3, cranial nerves grossly intact. moves all 4 extremities w/o difficulty. Affect pleasant   ASSESSMENT & PLAN:  1. Chronic Diastolic Heart Failure - Echo 11/07/17 LVEF 65-70%, Grade 2 DD - Improved NYHA III symptoms. Volume status much improved.  - Continue torsemide 40 mg am/20 mg pm. - Continue metolazone 2.5 mg on Wednesday.  - Continue coreg 6.25 mg BID.  - Continue spiro 25 mg daily.  - Reinforced low salt food choices, and limiting fluid intake to < 2 liters per day.   - She would benefit from [place]. Hospitalized in April of this year with A/C Diastolic Heart Failure. Will schedule this today. 2. Obesity - Body mass index is 42.61 kg/m.  - Discussed portion control.    3. OSA - Continue CPAP. No change.   4. CAD - 4 stents. 08/2016  stent RCA.  - Recent LHC nonobstructive.  - On plavix, bb, and statin.  - No s/s ischemia   5. PAF - S/P ablation 2016. -  Regular on exam.  - Stressed compliance with CPAP - Holter monitor 06/2017 with occasional PACs and SVT.  - BB as above.  7. HTN:  - Stable today.  8. CKD III - Baseline creatinine appears to be 1.3 - 1.5 - BMET today 9. Syncope Unexplained syncope in June 2019. Does not sound like vasovagal or orthostasis.  She understands she cant drive for 6 months  (through December 2019) Has 14 day Zio patch to assess for arrhythmias. Wearing through 8/21  BP and HR stable. Will not make med adjustments today with recent syncope.  BMET, mag Schedule cardiomems with Dr [name] Follow up in 4 weeks   Georgiana Shore, NP  10:10 AM   Greater than 50% of the 25 minute visit was spent in counseling/coordination of care regarding disease state education, salt/fluid restriction, sliding scale diuretics, and medication compliance.

## 2018-02-27 NOTE — Progress Notes (Signed)
[facility]  NOTE  Referring Physician: Dr Bettina Gavia  Primary Care: Dr Tobie Poet in [place] Primary Cardiologist: Dr Bettina Gavia  Pulmonary: Dr Alcide Clever HF: Dr [name]  HPI: Dana [name] is a 57 y.o. female with a history of obesity, diastolic heart failure, DMI, neuropathy, HTN, hyperlipidemia, CAD (most recent [facility] 08/2016 DES RCA and stent LAD total of 4 stents), anemia, OSA --> started CPAP 12/2016, PAF S/P ablation, PAD S/P L great toe and 4th toe amputation.   Admitted 4/24 - 11/11/17 with chest pain while getting up out of her recliner.  EKG unremarkable. CXR with pulmonary edema. Troponin  0.03 - 0.05. Diuresed with IV lasix with resolution of symptoms. Echo with normal EF as below. Torsemide cut back to 60 mg daily on discharge.   On June 25th she was evaluated in the ED for syncope. She was in her usual state with no complaints. She was standing at the sink and had high pitch sound in her ears. Her husband found her on kitchen floor. She was evaluated in ED and later sent home.   Today she returns for HF follow up. Last visit, torsemide and spiro were increased. Ziopatch was placed to assess for arrhythmias with recent syncope. Overall doing much better. She is only SOB when walking quickly or going up stairs. Activity is more limited by back pain than SOB. Denies edema. Has chronic orthopnea. No dizziness or syncope. No bleeding on plavix. Has not taken PRN metolazone. No CP. She has palpitations when being intimate with her husband. Wearing ziopatch now through 8/21. Wearing CPAP qHS. She is waiting to hear about scheduling her cardiomems. Limits fluid and salt intake. Taking all medications. Weights have gone down from 256 to 244 lbs on her home scale.   - Echo 11/07/17 LVEF 65-70%, Grade 2 DD. Trivial MR, Mild LAE. - ECHO 08/2016 EF 55% RV was not clearly visualized  Studies RHC/LHC 01/07/2017  Stents patent RA mean 7 RV 31/6 PA 31/10, mean 20 PCWP mean 7 LV 142/12 AO  134/64 Oxygen saturations: PA 66% AO 99% Cardiac Output (Fick) 7.31  Cardiac Index (Fick) 3.37 Left Main  No significant disease.  Left Anterior Descending  30% ostial stenosis. 30-40% proximal stenosis. Patent mid LAD stent. 40-50% mid LAD stenosis distal to stent.  Left Circumflex  Large OM1. 30% mid LCx stenosis after OM1. Moderate OM2 with 30-40% proximal stenosis. 30% distal LCx stenosis.  Right Coronary Artery  Serial 30-40% stenoses in the proximal RCA. Long stented segment in the PDA was patent.    Cardiac cath [facility] 08/30/16:  2. Severe stenosis of the distal RCA extending into the rPDA. 3. Patent stent in the LAD. 4. Moderate macrovascular disease elsewhere. 5. Diffuse microvascular disease seen. 6. Mild pulmonary hypertension. 7. Normal LVEDP. 8. Normal cardiac output. Interventional Summary Successful PCI / Xience Drug Eluting Stent (2.75/23 mm) of the distal Right RA 7 PCW 14 CO/CO 6.3/3  CTA 08/2016 --. No PE    Review of systems complete and found to be negative unless listed in HPI.    Past Medical History:  Diagnosis Date  . Anginal pain ([place])   . CAD (coronary artery disease)    08/2016 DES to RCA, also with history of LAD stenting.   . Diabetes ([place])   . Diastolic CHF ([place])   . Hyperlipidemia   . Hypertension   . OSA (obstructive sleep apnea)     Current Outpatient Medications  Medication Sig Dispense Refill  . albuterol (  ACCUNEB) 0.63 MG/3ML nebulizer solution Inhale 1 ampule by nebulization every six (6) hours as needed for wheezing.    Marland Kitchen albuterol (PROVENTIL HFA;VENTOLIN HFA) 108 (90 Base) MCG/ACT inhaler Inhale 2 puffs into the lungs every 6 (six) hours as needed for wheezing or shortness of breath.     . carvedilol (COREG) 6.25 MG tablet Take 1 tablet (6.25 mg total) by mouth 2 (two) times daily. 60 tablet 3  . clopidogrel (PLAVIX) 75 MG tablet Take 75 mg by mouth at bedtime.     . eszopiclone (LUNESTA) 2 MG TABS tablet Take 2 mg by mouth at  bedtime.     . ferrous sulfate 325 (65 FE) MG tablet Take 325 mg by mouth 2 (two) times daily with a meal.    . hydrALAZINE (APRESOLINE) 50 MG tablet Take 50 mg by mouth 2 (two) times daily.  3  . HYDROcodone-acetaminophen (NORCO) 7.5-325 MG tablet Take 1 tablet by mouth 2 (two) times daily.    . insulin NPH Human (HUMULIN N,NOVOLIN N) 100 UNIT/ML injection Inject 30 units into the skin two times a day- before breakfast and supper/MDD=60 units    . insulin regular (NOVOLIN R,HUMULIN R) 100 units/mL injection Inject 7-15 Units into the skin 3 (three) times daily before meals.     . isosorbide mononitrate (IMDUR) 30 MG 24 hr tablet Take 0.5 tablets (15 mg total) by mouth daily. 15 tablet 6  . latanoprost (XALATAN) 0.005 % ophthalmic solution Place 1 drop into both eyes at bedtime.    . metolazone (ZAROXOLYN) 2.5 MG tablet TAKE 1 TABLET BY MOUTH ONCE A WEEK. TAKE EVERY WEDNESDAY 10 tablet 4  . nitroGLYCERIN (NITROSTAT) 0.4 MG SL tablet Place 0.4 mg under the tongue every 5 (five) minutes as needed for chest pain.     . Omega-3 Fatty Acids (FISH OIL PO) Take 1 capsule by mouth daily.    . OXYGEN Inhale 2 L into the lungs at bedtime.    . pravastatin (PRAVACHOL) 40 MG tablet Take 80 mg by mouth at bedtime.     . Probiotic CAPS Take 1 capsule by mouth daily.    . promethazine (PHENERGAN) 25 MG tablet Take 25 mg by mouth every 6 (six) hours as needed for nausea or vomiting.    Marland Kitchen spironolactone (ALDACTONE) 25 MG tablet Take 1 tablet (25 mg total) by mouth daily. 30 tablet 3  . torsemide (DEMADEX) 20 MG tablet Take 2 tablets (40 mg total) by mouth every morning AND 1 tablet (20 mg total) daily with lunch AND 1 tablet (20 mg total) every evening. 120 tablet 3  . Vitamin D, Ergocalciferol, (DRISDOL) 50000 units CAPS capsule Take 50,000 Units by mouth once a week. Every wednesday     No current facility-administered medications for this encounter.     Allergies  Allergen Reactions  . Naproxen Hives and  Rash  . Celecoxib Other (See Comments)    Stomach pain  . Aspirin Other (See Comments)    CAN NOT TAKE DUE TO ULCERS  . Glucosamine Hives, Swelling and Other (See Comments)    Angioedema   . Metoclopramide Other (See Comments)    Facial twitching and stuttering  . Metolazone Other (See Comments)    Acute renal failure  . Shellfish-Derived Products Hives, Swelling and Other (See Comments)    Angioedema   . Tramadol Nausea And Vomiting and Other (See Comments)           Social History   Socioeconomic History  .  Marital status: Married    Spouse name: Not on file  . Number of children: Not on file  . Years of education: Not on file  . Highest education level: Not on file  Occupational History  . Not on file  Social Needs  . Financial resource strain: Not on file  . Food insecurity:    Worry: Not on file    Inability: Not on file  . Transportation needs:    Medical: Not on file    Non-medical: Not on file  Tobacco Use  . Smoking status: Former Research scientist (life sciences)  . Smokeless tobacco: Never Used  Substance and Sexual Activity  . Alcohol use: Not Currently  . Drug use: Not Currently  . Sexual activity: Not on file  Lifestyle  . Physical activity:    Days per week: Not on file    Minutes per session: Not on file  . Stress: Not on file  Relationships  . Social connections:    Talks on phone: Not on file    Gets together: Not on file    Attends religious service: Not on file    Active member of club or organization: Not on file    Attends meetings of clubs or organizations: Not on file    Relationship status: Not on file  . Intimate partner violence:    Fear of current or ex partner: Not on file    Emotionally abused: Not on file    Physically abused: Not on file    Forced sexual activity: Not on file  Other Topics Concern  . Not on file  Social History Narrative  . Not on file    FamHx:  Denies h/o familial CM or SCD. Multiple family members with CAD.   Vitals:     02/28/18 0953  BP: 134/68  Pulse: 86  SpO2: 98%  Weight: 112.6 kg (248 lb 4 oz)   Wt Readings from Last 3 Encounters:  02/28/18 112.6 kg (248 lb 4 oz)  02/14/18 116.1 kg (256 lb)  11/22/17 114.1 kg (251 lb 8 oz)    PHYSICAL EXAM: General:  No resp difficulty. HEENT: Normal Neck: Supple. JVP difficult but does not appear elevated. Carotids 2+ bilat; no bruits. No thyromegaly or nodule noted. Cor: PMI nondisplaced. RRR, No M/G/R noted Lungs: CTAB, normal effort. Abdomen: Soft, non-tender, non-distended, no HSM. No bruits or masses. +BS  Extremities: No cyanosis, clubbing, or rash. R and LLE no edema. Vascular changes on RLE  Neuro: Alert & orientedx3, cranial nerves grossly intact. moves all 4 extremities w/o difficulty. Affect pleasant   ASSESSMENT & PLAN:  1. Chronic Diastolic Heart Failure - Echo 11/07/17 LVEF 65-70%, Grade 2 DD - Improved NYHA III symptoms. Volume status much improved.  - Continue torsemide 40 mg am/20 mg pm. - Continue metolazone 2.5 mg on Wednesday.  - Continue coreg 6.25 mg BID.  - Continue spiro 25 mg daily.  - Reinforced low salt food choices, and limiting fluid intake to < 2 liters per day.   - She would benefit from [facility]. Hospitalized in April of this year with A/C Diastolic Heart Failure. Will schedule this today. 2. Obesity - Body mass index is 42.61 kg/m.  - Discussed portion control.    3. OSA - Continue CPAP. No change.   4. CAD - 4 stents. 08/2016  stent RCA.  - Recent LHC nonobstructive.  - On plavix, bb, and statin.  - No s/s ischemia   5. PAF - S/P ablation 2016. -  Regular on exam.  - Stressed compliance with CPAP - Holter monitor 06/2017 with occasional PACs and SVT.  - BB as above.  7. HTN:  - Stable today.  8. CKD III - Baseline creatinine appears to be 1.3 - 1.5 - BMET today 9. Syncope Unexplained syncope in June 2019. Does not sound like vasovagal or orthostasis.  She understands she cant drive for 6 months  (through December 2019) Has 14 day Zio patch to assess for arrhythmias. Wearing through 8/21  BP and HR stable. Will not make med adjustments today with recent syncope.  BMET, mag Schedule cardiomems with Dr [name] Follow up in 4 weeks   Georgiana Shore, NP  10:10 AM   Greater than 50% of the 25 minute visit was spent in counseling/coordination of care regarding disease state education, salt/fluid restriction, sliding scale diuretics, and medication compliance.

## 2018-02-28 ENCOUNTER — Other Ambulatory Visit: Payer: Self-pay

## 2018-02-28 ENCOUNTER — Ambulatory Visit (HOSPITAL_COMMUNITY)
Admission: RE | Admit: 2018-02-28 | Discharge: 2018-02-28 | Disposition: A | Discharge status: Home / Self Care | Payer: Medicare Other | Source: Ambulatory Visit | Attending: Cardiology | Admitting: Cardiology

## 2018-02-28 ENCOUNTER — Encounter (HOSPITAL_COMMUNITY): Payer: Self-pay

## 2018-02-28 ENCOUNTER — Encounter (HOSPITAL_COMMUNITY): Payer: Self-pay | Admitting: Cardiology

## 2018-02-28 VITALS — BP 134/68 | HR 86 | Wt 248.2 lb

## 2018-02-28 DIAGNOSIS — Z955 Presence of coronary angioplasty implant and graft: Secondary | ICD-10-CM | POA: Insufficient documentation

## 2018-02-28 DIAGNOSIS — Z8249 Family history of ischemic heart disease and other diseases of the circulatory system: Secondary | ICD-10-CM | POA: Diagnosis not present

## 2018-02-28 DIAGNOSIS — I5030 Unspecified diastolic (congestive) heart failure: Secondary | ICD-10-CM | POA: Diagnosis not present

## 2018-02-28 DIAGNOSIS — E785 Hyperlipidemia, unspecified: Secondary | ICD-10-CM | POA: Diagnosis not present

## 2018-02-28 DIAGNOSIS — I480 Paroxysmal atrial fibrillation: Secondary | ICD-10-CM | POA: Insufficient documentation

## 2018-02-28 DIAGNOSIS — Z87891 Personal history of nicotine dependence: Secondary | ICD-10-CM | POA: Insufficient documentation

## 2018-02-28 DIAGNOSIS — M549 Dorsalgia, unspecified: Secondary | ICD-10-CM | POA: Diagnosis not present

## 2018-02-28 DIAGNOSIS — I471 Supraventricular tachycardia: Secondary | ICD-10-CM | POA: Diagnosis not present

## 2018-02-28 DIAGNOSIS — I130 Hypertensive heart and chronic kidney disease with heart failure and stage 1 through stage 4 chronic kidney disease, or unspecified chronic kidney disease: Secondary | ICD-10-CM | POA: Insufficient documentation

## 2018-02-28 DIAGNOSIS — I5032 Chronic diastolic (congestive) heart failure: Secondary | ICD-10-CM | POA: Diagnosis not present

## 2018-02-28 DIAGNOSIS — E1122 Type 2 diabetes mellitus with diabetic chronic kidney disease: Secondary | ICD-10-CM | POA: Insufficient documentation

## 2018-02-28 DIAGNOSIS — Z6841 Body Mass Index (BMI) 40.0 and over, adult: Secondary | ICD-10-CM | POA: Insufficient documentation

## 2018-02-28 DIAGNOSIS — N183 Chronic kidney disease, stage 3 unspecified: Secondary | ICD-10-CM

## 2018-02-28 DIAGNOSIS — E669 Obesity, unspecified: Secondary | ICD-10-CM | POA: Insufficient documentation

## 2018-02-28 DIAGNOSIS — R55 Syncope and collapse: Secondary | ICD-10-CM | POA: Diagnosis not present

## 2018-02-28 DIAGNOSIS — I2510 Atherosclerotic heart disease of native coronary artery without angina pectoris: Secondary | ICD-10-CM | POA: Insufficient documentation

## 2018-02-28 DIAGNOSIS — R0602 Shortness of breath: Secondary | ICD-10-CM | POA: Diagnosis not present

## 2018-02-28 DIAGNOSIS — D649 Anemia, unspecified: Secondary | ICD-10-CM | POA: Diagnosis not present

## 2018-02-28 DIAGNOSIS — Z7902 Long term (current) use of antithrombotics/antiplatelets: Secondary | ICD-10-CM | POA: Insufficient documentation

## 2018-02-28 DIAGNOSIS — G4733 Obstructive sleep apnea (adult) (pediatric): Secondary | ICD-10-CM | POA: Diagnosis not present

## 2018-02-28 DIAGNOSIS — Z794 Long term (current) use of insulin: Secondary | ICD-10-CM | POA: Diagnosis not present

## 2018-02-28 DIAGNOSIS — Z79899 Other long term (current) drug therapy: Secondary | ICD-10-CM | POA: Insufficient documentation

## 2018-02-28 DIAGNOSIS — Z886 Allergy status to analgesic agent status: Secondary | ICD-10-CM | POA: Diagnosis not present

## 2018-02-28 LAB — BASIC METABOLIC PANEL
Anion gap: 8 (ref 5–15)
BUN: 27 mg/dL — ABNORMAL HIGH (ref 6–20)
CO2: 24 mmol/L (ref 22–32)
Calcium: 8.7 mg/dL — ABNORMAL LOW (ref 8.9–10.3)
Chloride: 107 mmol/L (ref 98–111)
Creatinine, Ser: 1.12 mg/dL — ABNORMAL HIGH (ref 0.44–1.00)
GFR calc Af Amer: >60 mL/min (ref >60)
GFR calc non Af Amer: 54 mL/min — ABNORMAL LOW (ref >60)
Glucose, Bld: 125 mg/dL — ABNORMAL HIGH (ref 70–99)
Potassium: 4.3 mmol/L (ref 3.5–5.1)
Sodium: 139 mmol/L (ref 135–145)

## 2018-02-28 LAB — MAGNESIUM: Magnesium: 1.9 mg/dL (ref 1.7–2.4)

## 2018-02-28 MED ORDER — ASPIRIN EC 81 MG PO TBEC: 81.0000 mg | DELAYED_RELEASE_TABLET | Freq: Every day | ORAL | 11 refills | Status: AC

## 2018-02-28 NOTE — Patient Instructions (Signed)
Labs today We will only contact you if something comes back abnormal or we need to make some changes. Otherwise no news is good news!  Your physician recommends that you schedule a follow-up appointment in: 4 weeks in the [facility]  (NP/PA) [place] has been scheduled for 03/11/18 @ 9:00 am Please arrive at 7:30 am See letter for additona instructions  Do the following things EVERYDAY: 1) Weigh yourself in the morning before breakfast. Write it down and keep it in a log. 2) Take your medicines as prescribed 3) Eat low salt foods-Limit salt (sodium) to 2000 mg per day.  4) Stay as active as you can everyday 5) Limit all fluids for the day to less than 2 liters

## 2018-03-03 ENCOUNTER — Telehealth (HOSPITAL_COMMUNITY): Payer: Self-pay | Admitting: *Deleted

## 2018-03-03 ENCOUNTER — Other Ambulatory Visit (HOSPITAL_COMMUNITY): Payer: Self-pay | Admitting: *Deleted

## 2018-03-03 DIAGNOSIS — I5030 Unspecified diastolic (congestive) heart failure: Secondary | ICD-10-CM

## 2018-03-03 NOTE — Telephone Encounter (Signed)
Pt's cardiomems has been approved by her medicare and [employer], cpt code [phone], procedure sch 8/27, orders placed and cardiomems rep is aware

## 2018-03-05 DIAGNOSIS — E1042 Type 1 diabetes mellitus with diabetic polyneuropathy: Secondary | ICD-10-CM | POA: Diagnosis not present

## 2018-03-05 DIAGNOSIS — I10 Essential (primary) hypertension: Secondary | ICD-10-CM | POA: Diagnosis not present

## 2018-03-05 DIAGNOSIS — E782 Mixed hyperlipidemia: Secondary | ICD-10-CM | POA: Diagnosis not present

## 2018-03-05 DIAGNOSIS — E1065 Type 1 diabetes mellitus with hyperglycemia: Secondary | ICD-10-CM | POA: Diagnosis not present

## 2018-03-05 DIAGNOSIS — Z794 Long term (current) use of insulin: Secondary | ICD-10-CM | POA: Diagnosis not present

## 2018-03-11 ENCOUNTER — Encounter (HOSPITAL_COMMUNITY): Payer: Self-pay | Admitting: *Deleted

## 2018-03-11 ENCOUNTER — Ambulatory Visit (HOSPITAL_COMMUNITY)
Admission: RE | Admit: 2018-03-11 | Discharge: 2018-03-11 | Disposition: A | Discharge status: Home / Self Care | Payer: Medicare Other | Source: Ambulatory Visit | Attending: Internal Medicine | Admitting: Internal Medicine

## 2018-03-11 ENCOUNTER — Encounter (HOSPITAL_COMMUNITY)
Admission: RE | Disposition: A | Discharge status: Home / Self Care | Payer: Self-pay | Source: Ambulatory Visit | Attending: Internal Medicine

## 2018-03-11 ENCOUNTER — Other Ambulatory Visit: Payer: Self-pay

## 2018-03-11 DIAGNOSIS — I5032 Chronic diastolic (congestive) heart failure: Secondary | ICD-10-CM

## 2018-03-11 DIAGNOSIS — Z6841 Body Mass Index (BMI) 40.0 and over, adult: Secondary | ICD-10-CM | POA: Diagnosis not present

## 2018-03-11 DIAGNOSIS — I471 Supraventricular tachycardia: Secondary | ICD-10-CM | POA: Diagnosis not present

## 2018-03-11 DIAGNOSIS — I130 Hypertensive heart and chronic kidney disease with heart failure and stage 1 through stage 4 chronic kidney disease, or unspecified chronic kidney disease: Secondary | ICD-10-CM | POA: Insufficient documentation

## 2018-03-11 DIAGNOSIS — I5030 Unspecified diastolic (congestive) heart failure: Secondary | ICD-10-CM

## 2018-03-11 DIAGNOSIS — Z89422 Acquired absence of other left toe(s): Secondary | ICD-10-CM | POA: Diagnosis not present

## 2018-03-11 DIAGNOSIS — E1040 Type 1 diabetes mellitus with diabetic neuropathy, unspecified: Secondary | ICD-10-CM | POA: Diagnosis not present

## 2018-03-11 DIAGNOSIS — I2510 Atherosclerotic heart disease of native coronary artery without angina pectoris: Secondary | ICD-10-CM | POA: Insufficient documentation

## 2018-03-11 DIAGNOSIS — Z87891 Personal history of nicotine dependence: Secondary | ICD-10-CM | POA: Diagnosis not present

## 2018-03-11 DIAGNOSIS — N183 Chronic kidney disease, stage 3 (moderate): Secondary | ICD-10-CM | POA: Insufficient documentation

## 2018-03-11 DIAGNOSIS — Z7902 Long term (current) use of antithrombotics/antiplatelets: Secondary | ICD-10-CM | POA: Diagnosis not present

## 2018-03-11 DIAGNOSIS — E669 Obesity, unspecified: Secondary | ICD-10-CM | POA: Diagnosis not present

## 2018-03-11 DIAGNOSIS — Z8249 Family history of ischemic heart disease and other diseases of the circulatory system: Secondary | ICD-10-CM | POA: Diagnosis not present

## 2018-03-11 DIAGNOSIS — G4733 Obstructive sleep apnea (adult) (pediatric): Secondary | ICD-10-CM | POA: Diagnosis not present

## 2018-03-11 DIAGNOSIS — E1022 Type 1 diabetes mellitus with diabetic chronic kidney disease: Secondary | ICD-10-CM | POA: Insufficient documentation

## 2018-03-11 DIAGNOSIS — Z955 Presence of coronary angioplasty implant and graft: Secondary | ICD-10-CM | POA: Diagnosis not present

## 2018-03-11 DIAGNOSIS — E785 Hyperlipidemia, unspecified: Secondary | ICD-10-CM | POA: Diagnosis not present

## 2018-03-11 DIAGNOSIS — Z89412 Acquired absence of left great toe: Secondary | ICD-10-CM | POA: Insufficient documentation

## 2018-03-11 LAB — POCT I-STAT 3, VENOUS BLOOD GAS (G3P V)
Acid-base deficit: 4 mmol/L — ABNORMAL HIGH (ref 0.0–2.0)
Acid-base deficit: 5 mmol/L — ABNORMAL HIGH (ref 0.0–2.0)
Bicarbonate: 21.2 mmol/L (ref 20.0–28.0)
Bicarbonate: 22.8 mmol/L (ref 20.0–28.0)
O2 Saturation: 69 %
O2 Saturation: 71 %
TCO2: 23 mmol/L (ref 22–32)
TCO2: 24 mmol/L (ref 22–32)
pCO2, Ven: 44.8 mmHg (ref 44.0–60.0)
pCO2, Ven: 47.7 mmHg (ref 44.0–60.0)
pH, Ven: 7.284 (ref 7.250–7.430)
pH, Ven: 7.286 (ref 7.250–7.430)
pO2, Ven: 40 mmHg (ref 32.0–45.0)
pO2, Ven: 42 mmHg (ref 32.0–45.0)

## 2018-03-11 LAB — CBC
HCT: 28.2 % — ABNORMAL LOW (ref 36.0–46.0)
Hemoglobin: 8.4 g/dL — ABNORMAL LOW (ref 12.0–15.0)
MCH: 25.6 pg — ABNORMAL LOW (ref 26.0–34.0)
MCHC: 29.8 g/dL — ABNORMAL LOW (ref 30.0–36.0)
MCV: 86 fL (ref 78.0–100.0)
Platelets: 318 10*3/uL (ref 150–400)
RBC: 3.28 MIL/uL — ABNORMAL LOW (ref 3.87–5.11)
RDW: 15 % (ref 11.5–15.5)
WBC: 10.5 10*3/uL (ref 4.0–10.5)

## 2018-03-11 LAB — GLUCOSE, CAPILLARY
Glucose-Capillary: 104 mg/dL — ABNORMAL HIGH (ref 70–99)
Glucose-Capillary: 84 mg/dL (ref 70–99)

## 2018-03-11 SURGERY — PRESSURE SENSOR/CARDIOMEMS
Anesthesia: LOCAL

## 2018-03-11 MED ORDER — HYDROCODONE-ACETAMINOPHEN 5-325 MG PO TABS
2.0000 | ORAL_TABLET | Freq: Once | ORAL | Status: AC
  Administered 2018-03-11: 2 via ORAL
  Filled 2018-03-11: qty 2

## 2018-03-11 MED ORDER — FENTANYL CITRATE (PF) 100 MCG/2ML IJ SOLN
INTRAMUSCULAR | Status: DC | PRN
  Administered 2018-03-11 (×3): 25 ug via INTRAVENOUS

## 2018-03-11 MED ORDER — SODIUM CHLORIDE 0.9 % IV SOLN: INTRAVENOUS | Status: DC

## 2018-03-11 MED ORDER — HEPARIN (PORCINE) IN NACL 1000-0.9 UT/500ML-% IV SOLN
INTRAVENOUS | Status: DC | PRN
  Administered 2018-03-11: 500 mL

## 2018-03-11 MED ORDER — SODIUM CHLORIDE 0.9% FLUSH: 3.0000 mL | Freq: Two times a day (BID) | INTRAVENOUS | Status: DC

## 2018-03-11 MED ORDER — LIDOCAINE HCL (PF) 1 % IJ SOLN
INTRAMUSCULAR | Status: AC
  Filled 2018-03-11: qty 30

## 2018-03-11 MED ORDER — SODIUM CHLORIDE 0.9 % IV SOLN: 250.0000 mL | INTRAVENOUS | Status: DC | PRN

## 2018-03-11 MED ORDER — MIDAZOLAM HCL 2 MG/2ML IJ SOLN
INTRAMUSCULAR | Status: AC
  Filled 2018-03-11: qty 2

## 2018-03-11 MED ORDER — ONDANSETRON HCL 4 MG/2ML IJ SOLN: 4.0000 mg | Freq: Four times a day (QID) | INTRAMUSCULAR | Status: DC | PRN

## 2018-03-11 MED ORDER — SODIUM CHLORIDE 0.9% FLUSH: 3.0000 mL | INTRAVENOUS | Status: DC | PRN

## 2018-03-11 MED ORDER — HEPARIN (PORCINE) IN NACL 1000-0.9 UT/500ML-% IV SOLN
INTRAVENOUS | Status: AC
  Filled 2018-03-11: qty 1000

## 2018-03-11 MED ORDER — LIDOCAINE HCL (PF) 1 % IJ SOLN
INTRAMUSCULAR | Status: DC | PRN
  Administered 2018-03-11: 16 mL

## 2018-03-11 MED ORDER — FENTANYL CITRATE (PF) 100 MCG/2ML IJ SOLN
INTRAMUSCULAR | Status: AC
  Filled 2018-03-11: qty 2

## 2018-03-11 MED ORDER — ACETAMINOPHEN 325 MG PO TABS: 650.0000 mg | ORAL_TABLET | ORAL | Status: DC | PRN

## 2018-03-11 MED ORDER — IOHEXOL 350 MG/ML SOLN
INTRAVENOUS | Status: DC | PRN
  Administered 2018-03-11: 10 mL via INTRA_ARTERIAL

## 2018-03-11 MED ORDER — MIDAZOLAM HCL 2 MG/2ML IJ SOLN
INTRAMUSCULAR | Status: DC | PRN
  Administered 2018-03-11: 1 mg via INTRAVENOUS
  Administered 2018-03-11: 2 mg via INTRAVENOUS

## 2018-03-11 SURGICAL SUPPLY — 12 items
CARDIOMEMS PA SENSOR W/DELIVER (Prosthesis & Implant Heart) ×2 IMPLANT
CATH SWAN GANZ 7F STRAIGHT (CATHETERS) ×2 IMPLANT
KIT HEART LEFT (KITS) ×2 IMPLANT
PACK CARDIAC CATHETERIZATION (CUSTOM PROCEDURE TRAY) ×2 IMPLANT
SENSOR CARDIOMEMS PA W/DELIVER (Prosthesis & Implant Heart) ×1 IMPLANT
SHEATH FAST CATH 12F 12CM (SHEATH) ×2 IMPLANT
SHEATH PINNACLE 6F 10CM (SHEATH) ×2 IMPLANT
SHEATH PINNACLE 9F 10CM (SHEATH) ×2 IMPLANT
TRANSDUCER W/MONITORING KIT (MISCELLANEOUS) ×2 IMPLANT
WIRE EMERALD 3MM-J .025X260CM (WIRE) ×2 IMPLANT
WIRE EMERALD 3MM-J .035X150CM (WIRE) ×2 IMPLANT
WIRE G V18X300CM (WIRE) ×2 IMPLANT

## 2018-03-11 NOTE — Interval H&P Note (Signed)
History and Physical Interval Note:  03/11/2018 8:45 AM  Dana [name]  has presented today for surgery, with the diagnosis of chf  The various methods of treatment have been discussed with the patient and family. After consideration of risks, benefits and other options for treatment, the patient has consented to  Procedure(s): PRESSURE SENSOR/CARDIOMEMS (N/A) as a surgical intervention .  The patient's history has been reviewed, patient examined, no change in status, stable for surgery.  I have reviewed the patient's chart and labs.  Questions were answered to the patient's satisfaction.     Generoso Cropper

## 2018-03-11 NOTE — Discharge Instructions (Signed)
Femoral Site Care °Refer to this sheet in the next few weeks. These instructions provide you with information about caring for yourself after your procedure. Your health care provider may also give you more specific instructions. Your treatment has been planned according to current medical practices, but problems sometimes occur. Call your health care provider if you have any problems or questions after your procedure. °What can I expect after the procedure? °After your procedure, it is typical to have the following: °· Bruising at the site that usually fades within 1-2 weeks. °· Blood collecting in the tissue (hematoma) that may be painful to the touch. It should usually decrease in size and tenderness within 1-2 weeks. ° °Follow these instructions at home: °· Take medicines only as directed by your health care provider. °· You may shower 24-48 hours after the procedure or as directed by your health care provider. Remove the bandage (dressing) and gently wash the site with plain soap and water. Pat the area dry with a clean towel. Do not rub the site, because this may cause bleeding. °· Do not take baths, swim, or use a hot tub until your health care provider approves. °· Check your insertion site every day for redness, swelling, or drainage. °· Do not apply powder or lotion to the site. °· Limit use of stairs to twice a day for the first 2-3 days or as directed by your health care provider. °· Do not squat for the first 2-3 days or as directed by your health care provider. °· Do not lift over 10 lb (4.5 kg) for 5 days after your procedure or as directed by your health care provider. °· Ask your health care provider when it is okay to: °? Return to work or school. °? Resume usual physical activities or sports. °? Resume sexual activity. °· Do not drive home if you are discharged the same day as the procedure. Have someone else drive you. °· You may drive 24 hours after the procedure unless otherwise instructed by  your health care provider. °· Do not operate machinery or power tools for 24 hours after the procedure or as directed by your health care provider. °· If your procedure was done as an outpatient procedure, which means that you went home the same day as your procedure, a responsible adult should be with you for the first 24 hours after you arrive home. °· Keep all follow-up visits as directed by your health care provider. This is important. °Contact a health care provider if: °· You have a fever. °· You have chills. °· You have increased bleeding from the site. Hold pressure on the site. °Get help right away if: °· You have unusual pain at the site. °· You have redness, warmth, or swelling at the site. °· You have drainage (other than a small amount of blood on the dressing) from the site. °· The site is bleeding, and the bleeding does not stop after 30 minutes of holding steady pressure on the site. °· Your leg or foot becomes pale, cool, tingly, or numb. °This information is not intended to replace advice given to you by your health care provider. Make sure you discuss any questions you have with your health care provider. °Document Released: 03/05/2014 Document Revised: 12/08/2015 Document Reviewed: 01/19/2014 °Elsevier Interactive Patient Education © 2018 [employer] °Moderate Conscious Sedation, Adult, Care After °These instructions provide you with information about caring for yourself after your procedure. Your health care provider may also give you more   specific instructions. Your treatment has been planned according to current medical practices, but problems sometimes occur. Call your health care provider if you have any problems or questions after your procedure. °What can I expect after the procedure? °After your procedure, it is common: °· To feel sleepy for several hours. °· To feel clumsy and have poor balance for several hours. °· To have poor judgment for several hours. °· To vomit if you eat too  soon. ° °Follow these instructions at home: °For at least 24 hours after the procedure: ° °· Do not: °? Participate in activities where you could fall or become injured. °? Drive. °? Use heavy machinery. °? Drink alcohol. °? Take sleeping pills or medicines that cause drowsiness. °? Make important decisions or sign legal documents. °? Take care of children on your own. °· Rest. °Eating and drinking °· Follow the diet recommended by your health care provider. °· If you vomit: °? Drink water, juice, or soup when you can drink without vomiting. °? Make sure you have little or no nausea before eating solid foods. °General instructions °· Have a responsible adult stay with you until you are awake and alert. °· Take over-the-counter and prescription medicines only as told by your health care provider. °· If you smoke, do not smoke without supervision. °· Keep all follow-up visits as told by your health care provider. This is important. °Contact a health care provider if: °· You keep feeling nauseous or you keep vomiting. °· You feel light-headed. °· You develop a rash. °· You have a fever. °Get help right away if: °· You have trouble breathing. °This information is not intended to replace advice given to you by your health care provider. Make sure you discuss any questions you have with your health care provider. °Document Released: 04/22/2013 Document Revised: 12/05/2015 Document Reviewed: 10/22/2015 °Elsevier Interactive Patient Education © 2018 [employer] Inc[employer] ° °

## 2018-03-11 NOTE — Progress Notes (Addendum)
Site area: RFV Site Prior to Removal:  Level 0 Pressure Applied For: 30 min Manual:   yes Patient Status During Pull:  stable Post Pull Site:  Level 0 Post Pull Instructions Given:  yes Post Pull Pulses Present: palpable Dressing Applied:  clear Bedrest begins @ 1100 till 1500 Comments:

## 2018-03-12 ENCOUNTER — Other Ambulatory Visit (HOSPITAL_COMMUNITY): Payer: Self-pay | Admitting: Internal Medicine

## 2018-03-12 DIAGNOSIS — R55 Syncope and collapse: Secondary | ICD-10-CM | POA: Diagnosis not present

## 2018-03-18 ENCOUNTER — Telehealth (HOSPITAL_COMMUNITY): Payer: Self-pay | Admitting: Adult Health

## 2018-03-18 MED ORDER — TORSEMIDE 20 MG PO TABS: ORAL_TABLET | ORAL | 3 refills | Status: DC

## 2018-03-18 NOTE — Telephone Encounter (Signed)
   Called with cardiomems reading.   Cardiomems reading elevated. She reports 2 pound weight gain.  Increase Continue torsemide 40 mg in am , and increase lunch torsemide to 40 mg. She will continue 20 mg torsemide in the evening.   Andreal verbalized understand ing.   Amy Clegg NP-C  1:18 PM

## 2018-03-19 NOTE — Addendum Note (Signed)
Encounter addended by: Georgiana Shore, NP on: 03/19/2018 8:14 AM  Actions taken: Follow-up modified, Visit diagnoses modified, LOS modified

## 2018-03-27 ENCOUNTER — Encounter (HOSPITAL_COMMUNITY): Payer: Medicare Other

## 2018-03-27 ENCOUNTER — Telehealth (HOSPITAL_COMMUNITY): Payer: Self-pay | Admitting: *Deleted

## 2018-03-28 ENCOUNTER — Telehealth (HOSPITAL_COMMUNITY): Payer: Self-pay | Admitting: *Deleted

## 2018-03-28 NOTE — Telephone Encounter (Signed)
Result Notes for LONG TERM MONITOR (3-14 DAYS)   Notes recorded by Darron Doom, RN on 03/27/2018 at 11:23 AM EDT Called and left detailed message, left number if she had questions to call back. ------  Notes recorded by Conrad [place], NP on 03/26/2018 at 4:27 PM EDT Please call monitor results. Occasional PAC PVCs. No changes needed.

## 2018-03-28 NOTE — Telephone Encounter (Signed)
Opened in error

## 2018-04-04 ENCOUNTER — Ambulatory Visit (HOSPITAL_COMMUNITY)
Admission: RE | Admit: 2018-04-04 | Discharge: 2018-04-04 | Disposition: A | Discharge status: Home / Self Care | Payer: Medicare Other | Source: Ambulatory Visit | Attending: Cardiology | Admitting: Cardiology

## 2018-04-04 VITALS — BP 144/72 | HR 95 | Wt 246.8 lb

## 2018-04-04 DIAGNOSIS — Z794 Long term (current) use of insulin: Secondary | ICD-10-CM | POA: Diagnosis not present

## 2018-04-04 DIAGNOSIS — Z89422 Acquired absence of other left toe(s): Secondary | ICD-10-CM | POA: Diagnosis not present

## 2018-04-04 DIAGNOSIS — G4733 Obstructive sleep apnea (adult) (pediatric): Secondary | ICD-10-CM | POA: Insufficient documentation

## 2018-04-04 DIAGNOSIS — N183 Chronic kidney disease, stage 3 unspecified: Secondary | ICD-10-CM

## 2018-04-04 DIAGNOSIS — I10 Essential (primary) hypertension: Secondary | ICD-10-CM | POA: Diagnosis not present

## 2018-04-04 DIAGNOSIS — E1040 Type 1 diabetes mellitus with diabetic neuropathy, unspecified: Secondary | ICD-10-CM | POA: Diagnosis not present

## 2018-04-04 DIAGNOSIS — I5032 Chronic diastolic (congestive) heart failure: Secondary | ICD-10-CM | POA: Insufficient documentation

## 2018-04-04 DIAGNOSIS — R55 Syncope and collapse: Secondary | ICD-10-CM | POA: Diagnosis not present

## 2018-04-04 DIAGNOSIS — Z888 Allergy status to other drugs, medicaments and biological substances status: Secondary | ICD-10-CM | POA: Insufficient documentation

## 2018-04-04 DIAGNOSIS — Z6841 Body Mass Index (BMI) 40.0 and over, adult: Secondary | ICD-10-CM | POA: Diagnosis not present

## 2018-04-04 DIAGNOSIS — D649 Anemia, unspecified: Secondary | ICD-10-CM | POA: Insufficient documentation

## 2018-04-04 DIAGNOSIS — Z89412 Acquired absence of left great toe: Secondary | ICD-10-CM | POA: Insufficient documentation

## 2018-04-04 DIAGNOSIS — I480 Paroxysmal atrial fibrillation: Secondary | ICD-10-CM | POA: Diagnosis not present

## 2018-04-04 DIAGNOSIS — Z7902 Long term (current) use of antithrombotics/antiplatelets: Secondary | ICD-10-CM | POA: Diagnosis not present

## 2018-04-04 DIAGNOSIS — Z79899 Other long term (current) drug therapy: Secondary | ICD-10-CM | POA: Insufficient documentation

## 2018-04-04 DIAGNOSIS — I5030 Unspecified diastolic (congestive) heart failure: Secondary | ICD-10-CM

## 2018-04-04 DIAGNOSIS — Z955 Presence of coronary angioplasty implant and graft: Secondary | ICD-10-CM | POA: Diagnosis not present

## 2018-04-04 DIAGNOSIS — E785 Hyperlipidemia, unspecified: Secondary | ICD-10-CM | POA: Insufficient documentation

## 2018-04-04 DIAGNOSIS — Z87891 Personal history of nicotine dependence: Secondary | ICD-10-CM | POA: Insufficient documentation

## 2018-04-04 DIAGNOSIS — I130 Hypertensive heart and chronic kidney disease with heart failure and stage 1 through stage 4 chronic kidney disease, or unspecified chronic kidney disease: Secondary | ICD-10-CM | POA: Insufficient documentation

## 2018-04-04 DIAGNOSIS — Z91013 Allergy to seafood: Secondary | ICD-10-CM | POA: Insufficient documentation

## 2018-04-04 DIAGNOSIS — Z7982 Long term (current) use of aspirin: Secondary | ICD-10-CM | POA: Diagnosis not present

## 2018-04-04 DIAGNOSIS — E1022 Type 1 diabetes mellitus with diabetic chronic kidney disease: Secondary | ICD-10-CM | POA: Diagnosis not present

## 2018-04-04 DIAGNOSIS — E669 Obesity, unspecified: Secondary | ICD-10-CM | POA: Diagnosis not present

## 2018-04-04 DIAGNOSIS — Z8249 Family history of ischemic heart disease and other diseases of the circulatory system: Secondary | ICD-10-CM | POA: Insufficient documentation

## 2018-04-04 DIAGNOSIS — I2510 Atherosclerotic heart disease of native coronary artery without angina pectoris: Secondary | ICD-10-CM | POA: Insufficient documentation

## 2018-04-04 DIAGNOSIS — Z886 Allergy status to analgesic agent status: Secondary | ICD-10-CM | POA: Diagnosis not present

## 2018-04-04 LAB — BASIC METABOLIC PANEL
Anion gap: 13 (ref 5–15)
BUN: 36 mg/dL — ABNORMAL HIGH (ref 6–20)
CO2: 30 mmol/L (ref 22–32)
Calcium: 9.5 mg/dL (ref 8.9–10.3)
Chloride: 94 mmol/L — ABNORMAL LOW (ref 98–111)
Creatinine, Ser: 1.41 mg/dL — ABNORMAL HIGH (ref 0.44–1.00)
GFR calc Af Amer: 48 mL/min — ABNORMAL LOW (ref >60)
GFR calc non Af Amer: 41 mL/min — ABNORMAL LOW (ref >60)
Glucose, Bld: 242 mg/dL — ABNORMAL HIGH (ref 70–99)
Potassium: 4 mmol/L (ref 3.5–5.1)
Sodium: 137 mmol/L (ref 135–145)

## 2018-04-04 LAB — CBC
HCT: 30.4 % — ABNORMAL LOW (ref 36.0–46.0)
Hemoglobin: 9.4 g/dL — ABNORMAL LOW (ref 12.0–15.0)
MCH: 25.7 pg — ABNORMAL LOW (ref 26.0–34.0)
MCHC: 30.9 g/dL (ref 30.0–36.0)
MCV: 83.1 fL (ref 78.0–100.0)
Platelets: 351 10*3/uL (ref 150–400)
RBC: 3.66 MIL/uL — ABNORMAL LOW (ref 3.87–5.11)
RDW: 14.6 % (ref 11.5–15.5)
WBC: 12.7 10*3/uL — ABNORMAL HIGH (ref 4.0–10.5)

## 2018-04-04 LAB — MAGNESIUM: Magnesium: 1.8 mg/dL (ref 1.7–2.4)

## 2018-04-04 MED ORDER — HYDRALAZINE HCL 50 MG PO TABS: 50.0000 mg | ORAL_TABLET | Freq: Three times a day (TID) | ORAL | 3 refills | Status: DC

## 2018-04-04 NOTE — Progress Notes (Signed)
ADVANCED [facility] CLINIC  NOTE  Referring Physician: Dr Bettina Gavia  Primary Care: Dr Tobie Poet in [place] Primary Cardiologist: Dr Bettina Gavia  Pulmonary: Dr Alcide Clever HF: Dr [name]  HPI: Dana [name] is a 57 y.o. female with a history of obesity, diastolic heart failure, DMI, neuropathy, HTN, hyperlipidemia, CAD (most recent [facility] 08/2016 DES RCA and stent LAD total of 4 stents), anemia, OSA --> started CPAP 12/2016, PAF S/P ablation, PAD S/P L great toe and 4th toe amputation.   Admitted 4/24 - 11/11/17 with chest pain while getting up out of her recliner.  EKG unremarkable. CXR with pulmonary edema. Troponin  0.03 - 0.05. Diuresed with IV lasix with resolution of symptoms. Echo with normal EF as below. Torsemide cut back to 60 mg daily on discharge.   On June 25th she was evaluated in the ED for syncope. She was in her usual state with no complaints. She was standing at the sink and had high pitch sound in her ears. Her husband found her on kitchen floor. She was evaluated in ED and later sent home.   She presents today for post cardiomems follow up. She is feeling good overall. SOB has improved. Activity mostly limited by back pain, which is chronic, but has had a worsening acute component over the past week. She says it is mostly mid back, but occasionally worse on the left. She sees renal next week. Denies dizziness or syncope. No lightheadedness. Hasn't needed any metolazone. She has been having cramps in her legs since torsemide increased. Not on any potassium. She been taking hydralazine BID, as instructed. Watching salt and fluid intake. Weight stable 244-246 at home.   Zio patch 02/2018 with no arrhythmias, only occasional PACs/PVCs  Cardiomems numbers reviewed: Current threshold 11-17. Has been running 7-12 over past few days. 7 today. Discussed with RN heather and they are considering lowering her threshold.   - Echo 11/07/17 LVEF 65-70%, Grade 2 DD. Trivial MR, Mild LAE. - ECHO 08/2016 EF 55% RV was  not clearly visualized  RHC 03/11/18 - Cardiomems device placed RA mean 10 RV 49/3 PA 50/19 (31) PCWP 16 Cardiac Output (Fick) 8.3 Cardiac Index (Fick) 3.88  Studies RHC/LHC 01/07/2017  Stents patent RA mean 7 RV 31/6 PA 31/10, mean 20 PCWP mean 7 LV 142/12 AO 134/64 Oxygen saturations: PA 66% AO 99% Cardiac Output (Fick) 7.31  Cardiac Index (Fick) 3.37 Left Main  No significant disease.  Left Anterior Descending  30% ostial stenosis. 30-40% proximal stenosis. Patent mid LAD stent. 40-50% mid LAD stenosis distal to stent.  Left Circumflex  Large OM1. 30% mid LCx stenosis after OM1. Moderate OM2 with 30-40% proximal stenosis. 30% distal LCx stenosis.  Right Coronary Artery  Serial 30-40% stenoses in the proximal RCA. Long stented segment in the PDA was patent.    Cardiac cath [facility] 08/30/16:  2. Severe stenosis of the distal RCA extending into the rPDA. 3. Patent stent in the LAD. 4. Moderate macrovascular disease elsewhere. 5. Diffuse microvascular disease seen. 6. Mild pulmonary hypertension. 7. Normal LVEDP. 8. Normal cardiac output. Interventional Summary Successful PCI / Xience Drug Eluting Stent (2.75/23 mm) of the distal Right RA 7 PCW 14 CO/CO 6.3/3  CTA 08/2016 --. No PE    Review of systems complete and found to be negative unless listed in HPI.    Past Medical History:  Diagnosis Date  . Anginal pain ([place])   . CAD (coronary artery disease)    08/2016 DES to RCA, also with  history of LAD stenting.   . Diabetes ([place])   . Diastolic CHF ([place])   . Hyperlipidemia   . Hypertension   . OSA (obstructive sleep apnea)     Current Outpatient Medications  Medication Sig Dispense Refill  . aspirin EC 81 MG tablet Take 1 tablet (81 mg total) by mouth daily. 30 tablet 11  . clopidogrel (PLAVIX) 75 MG tablet Take 75 mg by mouth at bedtime.     . hydrALAZINE (APRESOLINE) 50 MG tablet Take 50 mg by mouth 2 (two) times daily.  3  . isosorbide mononitrate  (IMDUR) 30 MG 24 hr tablet Take 0.5 tablets (15 mg total) by mouth daily. 15 tablet 6  . metolazone (ZAROXOLYN) 2.5 MG tablet TAKE 1 TABLET BY MOUTH ONCE A WEEK. TAKE EVERY WEDNESDAY (Patient taking differently: Take 2.5 mg by mouth See admin instructions. Take 2.5 mg by mouth once weekly on Wednesday. May take additional 2.5 mg dose on Saturday if needed for fluid) 10 tablet 4  . Omega-3 Fatty Acids (FISH OIL PO) Take 1 capsule by mouth daily.    . OXYGEN Inhale 2 L into the lungs at bedtime.    . pravastatin (PRAVACHOL) 40 MG tablet Take 80 mg by mouth at bedtime.     Marland Kitchen spironolactone (ALDACTONE) 25 MG tablet Take 1 tablet (25 mg total) by mouth daily. 30 tablet 3  . torsemide (DEMADEX) 20 MG tablet Take 2 tablets (40 mg total) by mouth every morning AND 2 tablets (40 mg total) daily with lunch AND 1 tablet (20 mg total) every evening. 120 tablet 3  . albuterol (ACCUNEB) 0.63 MG/3ML nebulizer solution Take 1 ampule by nebulization every 6 (six) hours as needed for wheezing or shortness of breath.     Marland Kitchen albuterol (PROVENTIL HFA;VENTOLIN HFA) 108 (90 Base) MCG/ACT inhaler Inhale 2 puffs into the lungs every 6 (six) hours as needed for wheezing or shortness of breath.     . carvedilol (COREG) 6.25 MG tablet Take 1 tablet (6.25 mg total) by mouth 2 (two) times daily. 60 tablet 3  . eszopiclone (LUNESTA) 2 MG TABS tablet Take 2 mg by mouth at bedtime.     . ferrous sulfate 325 (65 FE) MG tablet Take 325 mg by mouth 2 (two) times daily with a meal.    . HYDROcodone-acetaminophen (NORCO) 7.5-325 MG tablet Take 1 tablet by mouth 3 (three) times daily as needed for moderate pain.     Marland Kitchen insulin NPH Human (HUMULIN N,NOVOLIN N) 100 UNIT/ML injection Inject 30 Units into the skin 2 (two) times daily before a meal.     . insulin regular (NOVOLIN R,HUMULIN R) 100 units/mL injection Inject 7-12 Units into the skin 3 (three) times daily before meals. Per sliding scale    . latanoprost (XALATAN) 0.005 % ophthalmic  solution Place 1 drop into both eyes at bedtime.    . nitroGLYCERIN (NITROSTAT) 0.4 MG SL tablet Place 0.4 mg under the tongue every 5 (five) minutes as needed for chest pain.     . Probiotic CAPS Take 1 capsule by mouth daily.    . promethazine (PHENERGAN) 25 MG tablet Take 25 mg by mouth every 6 (six) hours as needed for nausea or vomiting.    . Vitamin D, Ergocalciferol, (DRISDOL) 50000 units CAPS capsule Take 50,000 Units by mouth every Wednesday.      No current facility-administered medications for this encounter.     Allergies  Allergen Reactions  . Naproxen Hives and Rash  .  Celecoxib Other (See Comments)    Stomach pain  . Aspirin Other (See Comments)    CAN NOT TAKE DUE TO ULCERS  . Glucosamine Hives, Swelling and Other (See Comments)    Angioedema   . Metoclopramide Other (See Comments)    Facial twitching and stuttering  . Metolazone Other (See Comments)    Acute renal failure  . Shellfish-Derived Products Hives, Swelling and Other (See Comments)    Angioedema   . Tramadol Nausea And Vomiting           Social History   Socioeconomic History  . Marital status: Married    Spouse name: Not on file  . Number of children: Not on file  . Years of education: Not on file  . Highest education level: Not on file  Occupational History  . Not on file  Social Needs  . Financial resource strain: Not on file  . Food insecurity:    Worry: Not on file    Inability: Not on file  . Transportation needs:    Medical: Not on file    Non-medical: Not on file  Tobacco Use  . Smoking status: Former Research scientist (life sciences)  . Smokeless tobacco: Never Used  Substance and Sexual Activity  . Alcohol use: Not Currently  . Drug use: Not Currently  . Sexual activity: Not on file  Lifestyle  . Physical activity:    Days per week: Not on file    Minutes per session: Not on file  . Stress: Not on file  Relationships  . Social connections:    Talks on phone: Not on file    Gets together: Not  on file    Attends religious service: Not on file    Active member of club or organization: Not on file    Attends meetings of clubs or organizations: Not on file    Relationship status: Not on file  . Intimate partner violence:    Fear of current or ex partner: Not on file    Emotionally abused: Not on file    Physically abused: Not on file    Forced sexual activity: Not on file  Other Topics Concern  . Not on file  Social History Narrative  . Not on file    FamHx:  Denies h/o familial CM or SCD. Multiple family members with CAD.   Vitals:   04/04/18 1006  BP: (!) 144/72  Pulse: 95  SpO2: 98%  Weight: 111.9 kg (246 lb 12.8 oz)   Wt Readings from Last 3 Encounters:  04/04/18 111.9 kg (246 lb 12.8 oz)  03/11/18 112 kg (247 lb)  02/28/18 112.6 kg (248 lb 4 oz)    PHYSICAL EXAM: General: Well appearing. No resp difficulty. HEENT: Normal Neck: Supple. JVP 7-8cm. Carotids 2+ bilat; no bruits. No thyromegaly or nodule noted. Cor: PMI nondisplaced. RRR, No M/G/R noted Lungs: CTAB, normal effort. Abdomen: Soft, non-tender, non-distended, no HSM. No bruits or masses. +BS  Extremities: No cyanosis, clubbing, or rash. R and LLE no edema.  Neuro: Alert & orientedx3, cranial nerves grossly intact. moves all 4 extremities w/o difficulty. Affect pleasant   ASSESSMENT & PLAN:  1. Chronic Diastolic Heart Failure - Echo 11/07/17 LVEF 65-70%, Grade 2 DD - s/p Cardiomems device 03/11/18. Reading at 7 mm Hg today.  - NYHA III symptoms, confounded by back pain and deconditioning.  - Volume status stable.   - Continue torsemide 40 mg am/20 mg pm. - Continue metolazone 2.5 mg on Wednesday.  -  Continue coreg 6.25 mg BID.  - Increase hydralazine to 50 mg TID - Continue imdur 15 mg daily.  - Continue spiro 25 mg daily.  - Reinforced fluid restriction to < 2 L daily, sodium restriction to less than 2000 mg daily, and the importance of daily weights.   2. Obesity - Body mass index is  42.36 kg/m.  - Discussed weight loss and portion control.  3. OSA - Continue CPAP. No change.   4. CAD - 4 stents. 08/2016  stent RCA.  - No s/s of ischemia.    - Recent LHC nonobstructive.  - On plavix, bb, and statin.  5. PAF - S/P ablation 2016. - Regular on exam.   - Stressed compliance with CPAP - Holter monitor 06/2017 with occasional PACs and SVT.  - BB as above.   7. HTN:  - Elevated on arrival today. Meds as above.  8. CKD III - Baseline creatinine appears to be 1.3 - 1.5 - BMET today.  9. Syncope - Unexplained syncope in June 2019. Does not sound like vasovagal or orthostasis.  - She understands she cant drive for 6 months (through December 2019) - Zio patch 02/2018 with no arrhythmias, only occasional PACs/PVCs  Doing well overall. Volume status stable on exam. Labs today. ? Low K/Mg with cramps. RTC 6-8 weeks with MD. Sooner with symptoms.   Satira Mccallum Kylii Ennis, PA-C  10:12 AM   Greater than 50% of the 25 minute visit was spent in counseling/coordination of care regarding disease state education, salt/fluid restriction, sliding scale diuretics, and medication compliance.

## 2018-04-04 NOTE — Patient Instructions (Signed)
INCREASE Hydralazine to 50 mg three times per day  Labs today We will only contact you if something comes back abnormal or we need to make some changes. Otherwise no news is good news!   Your physician recommends that you schedule a follow-up appointment in: 6-8 weeks with Dr [name]   Do the following things EVERYDAY: 1) Weigh yourself in the morning before breakfast. Write it down and keep it in a log. 2) Take your medicines as prescribed 3) Eat low salt foods-Limit salt (sodium) to 2000 mg per day.  4) Stay as active as you can everyday 5) Limit all fluids for the day to less than 2 liters

## 2018-04-09 ENCOUNTER — Telehealth (HOSPITAL_COMMUNITY): Payer: Self-pay

## 2018-04-09 MED ORDER — MAGNESIUM OXIDE 400 MG PO TABS: 800.0000 mg | ORAL_TABLET | Freq: Every day | ORAL | 1 refills | Status: DC

## 2018-04-09 NOTE — Telephone Encounter (Signed)
Pt called back for an Rx for magnesium sent to her pharmacy. Rx has been sent of magnesium 800 mg daily.

## 2018-04-10 DIAGNOSIS — D649 Anemia, unspecified: Secondary | ICD-10-CM | POA: Diagnosis not present

## 2018-04-10 DIAGNOSIS — E669 Obesity, unspecified: Secondary | ICD-10-CM | POA: Diagnosis not present

## 2018-04-10 DIAGNOSIS — R309 Painful micturition, unspecified: Secondary | ICD-10-CM | POA: Diagnosis not present

## 2018-04-10 DIAGNOSIS — N182 Chronic kidney disease, stage 2 (mild): Secondary | ICD-10-CM | POA: Diagnosis not present

## 2018-04-10 DIAGNOSIS — I509 Heart failure, unspecified: Secondary | ICD-10-CM | POA: Diagnosis not present

## 2018-04-10 DIAGNOSIS — R809 Proteinuria, unspecified: Secondary | ICD-10-CM | POA: Diagnosis not present

## 2018-04-10 DIAGNOSIS — I10 Essential (primary) hypertension: Secondary | ICD-10-CM | POA: Diagnosis not present

## 2018-04-10 DIAGNOSIS — E1169 Type 2 diabetes mellitus with other specified complication: Secondary | ICD-10-CM | POA: Diagnosis not present

## 2018-04-23 VITALS — BP 146/86 | Ht 64.0 in | Wt 248.6 lb

## 2018-04-23 DIAGNOSIS — Z006 Encounter for examination for normal comparison and control in clinical research program: Secondary | ICD-10-CM

## 2018-04-23 NOTE — Research (Addendum)
The informed consent form, study requirements and expectations were reviewed with the subject and questions and concerns were addressed prior to the signing of the consent form.  The subject verbalized understanding of the trial requirements.  The subject agreed to participate in the [facility] trial and signed the informed consent.  The informed consent was obtained prior to performance of any protocol-specific procedures for the subject.  A copy of the signed informed consent was given to the subject and a copy was placed in the subject's medical record.  [facility]-CV Screening Visit  Patient Name Dana [name] DOB 06/09/1961  Subject ID# [id number]  Visit Date/Informed Consent Date 04/23/18  Demographics      Sex female   Ethnicity Not Hispanic or Latino  Age 57 y.o.  Race [x] White [] Black or African American [] Asian [] American [place] and [place] Native [] Native Hawaiian [] Other Pacific Islander  Reproductive Status  [] Childbearing potentially (Primary method of birth control ____)      (Date of last menstrual period____) (Breastfeeding?____) [] postmenopausal [x] Surgically sterile [] Infertile [] other  Local Labs  Collection Date  04/23/18  Patient Fasting   [x] Yes   [] No [] Urine pregnancy test [x] Chemistry [x] Fasting lipid panel    05/06/2018-Screen Fail 6.1 Inclusion Criteria Subjects are eligible to be included in the study only if all of the following criteria apply:  [x] 101 Subject has provided informed consent prior to initiation of any study specific            activities/procedures  [x] 102 Adult subjects ? 14 years (men) or ? 74 years (women) to < 79 years of age            (either sex)  [x] 68 Subjects must have an LDL-C ? 100 mg/dL (? 2.6 mmol/L) or            non-HDL-C ? 130 mg/dL (? 3.4 mmol/L) at screening, after ? 4 weeks of            optimized lipid-lowering therapy   [x] 104 Diagnostic evidence of at least 1 of the following (A - D) at screening: [] A.  Significant coronary artery disease meeting at least 1 of the following  criteria: []  History of coronary revascularization with multi-vessel coronary       disease as evidenced by any of the following: []  (a) multi-vessel percutaneous coronary intervention (PCI) []  (b) PCI or coronary artery bypass grafting (CABG) with            residual  ? 50% stenosis in a separate, unrevascularized           segment or vessel, Or   []  (c) multi-vessel CABG at least 5 years prior to screening []  Significant coronary disease without prior revascularization as       evidenced by either a ? 70% stenosis of at least 1 coronary artery,       ? 50% stenosis of 2 or more coronary arteries, or ? 50% stenosis of       the left main coronary artery []  known coronary artery calcium score ? 100 [] B. Significant atherosclerotic cerebrovascular disease meeting at least 1 of          the following criteria: []  prior transient ischemic attack with ? 50% carotid stenosis []  carotid artery stenosis of ? 70% or 2 or more ? 50% stenosis []  prior carotid artery revascularization [] C. Significant peripheral arterial disease meeting at least 1 of the following          criteria: []  ? 50% stenosis in  a limb artery []  history of abdominal aorta treatment (percutaneous and surgical) []  ankle brachial index (ABI) < 0.85 [x] D. Diabetes mellitus with at least 1 of the following: []  known microvascular disease, defined by diabetic nephropathy or      treated retinopathy. Diabetic nephropathy defined as      microalbuminuria (urinary albumin to creatinine ratio ? 63m/g)      and/or estimated glomerular filtration rate      (eGFR) < 60 mL/min/1.73 m2 [x]  chronic treatment with insulin []  diabetes diagnosis ? 10 years ago  [] 105 At least 1 of the following high risk criteria at screening (most recent lab             values prior to screening, as applicable): []  polyvascular disease, defined as coronary, carotid, or peripheral  artery      stenosis ? 50% in a second distinct vascular location in a patient with      coronary, cerebral or peripheral arterial disease (inclusion criterion 104 A-C) []  diabetes or known evidence of metabolic syndrome (Section 12.9) in a      subject with coronary, cerebral, or peripheral artery disease (inclusion      criterion 104 A-C) []  at least 1 coronary, carotid, or peripheral artery stenosis of ? 50% in a       patient with diabetes meeting inclusion criterion 104 D [x] LDL ? 130 mg/dL (? 3.4 mmol/L) or non-HDL ? 160 mg/dL (> 4.2 mmol/L) []  lipoprotein (a) > 125 nmol/L (50 mg/dL) []  known familial hypercholesterolemia []  family history of premature coronary artery disease defined as an MI or     CABG in the subject's father or brother at age < 578years or an MI or CABG     in the subject's mother or sister at age < 6101years []  high sensitive c-reactive protein ? 3.0 mg/L []  current tobacco use []  ? 55years of age []  menopause before 55years of age []  eGFR 15 to < 45 mL/min/1.73 m2   6.2 Exclusion Criteria Subjects are excluded from the study if any of the following criteria apply:  Disease Related [] 201 MI or stroke prior to randomization [] 202 CABG < 3 months prior to screening [] 203 Uncontrolled or recurrent ventricular tachycardia [] 204 Atrial fibrillation not on anticoagulation therapy [] 205 Uncontrolled hypertension (sitting systolic blood pressure > 1794mmHg or diastolic blood pressure > 110 mmHg) at screening [x] 206 Last measured left-ventricular ejection fraction < 30% or New York Heart Association (NYHA) Functional Class III/IV Diagnostic Assessments [] 207 Fasting triglycerides ? 500 mg/dL (5.7 mmol/L) at screening [] 208 End stage renal disease (ESRD), defined as an eGFR < 15 mL/min/1.73 m2            Or receiving dialysis at screening  Other Medical Conditions [] 209 Malignancy, except non-melanoma skin cancers, or in situ cancers of the cervix, prostate,  or breast duct within 5 years prior to screening [] 210 History or evidence of clinically significant disease (eg, malignancy, respiratory, gastrointestinal, renal or psychiatric disease) or unstable disorder that, in the Opinion of the investigator(s), [employer] physician or designee would pose  a risk to the patient's safety or interfere with the study assessments,  procedures, completion, or result in a life expectancy of less than 1 year [] 233Previously received or receiving evolocumab or any other therapy to inhibit PCSK9 [] 212 Previously received a cholesterol ester transfer protein (CETP) inhibitor (ie, anacetrapib, dalcetrapib, evacetrapib), mipomersen, lomitapide, or has undergone LDL-apheresis in the last 12 months prior to LDL-C screening  Prior/Concurrent Clinical Study Experience [] 213 Currently receiving treatment in another investigational device or drug study, or less than 30 days since ending treatment on another investigational device or drug study(ies).  Other Exclusions [] 70 Female subject is pregnant, had a positive pregnancy test at screening, breastfeeding, or planning to become pregnant or breastfeed during treatment and for an additional 15 weeks after the last dose of investigational product. [] 215 Female subjects of childbearing potential unwilling to use 1 acceptable method             of effective contraception during treatment and for an additional 15 weeks after             the last dose of investigational product.  [] 216 Subject has known sensitivity to any of the products or components to be administered during dosing. [] 217 Subject likely to not be available to complete all protocol-required study visits or procedures, and/or to comply with all required study procedures to the best of  The subject and investigator's knowledge. [] 218 Subject is staff personal directly involved with the study or is a family member of the investigational study staff

## 2018-05-02 ENCOUNTER — Other Ambulatory Visit (HOSPITAL_COMMUNITY): Payer: Self-pay | Admitting: Internal Medicine

## 2018-05-02 LAB — COMPREHENSIVE METABOLIC PANEL
ALT: 20 IU/L (ref 0–32)
AST: 21 IU/L (ref 0–40)
Albumin/Globulin Ratio: 1.2 (ref 1.2–2.2)
Albumin: 3.9 g/dL (ref 3.5–5.5)
Alkaline Phosphatase: 156 IU/L — ABNORMAL HIGH (ref 39–117)
BUN/Creatinine Ratio: 17 (ref 9–23)
BUN: 20 mg/dL (ref 6–24)
Bilirubin Total: 0.2 mg/dL (ref 0.0–1.2)
CO2: 21 mmol/L (ref 20–29)
Calcium: 9.2 mg/dL (ref 8.7–10.2)
Chloride: 105 mmol/L (ref 96–106)
Creatinine, Ser: 1.15 mg/dL — ABNORMAL HIGH (ref 0.57–1.00)
GFR calc Af Amer: 62 mL/min/{1.73_m2} (ref >59)
GFR calc non Af Amer: 54 mL/min/{1.73_m2} — ABNORMAL LOW (ref >59)
Globulin, Total: 3.2 g/dL (ref 1.5–4.5)
Glucose: 118 mg/dL — ABNORMAL HIGH (ref 65–99)
Potassium: 4.7 mmol/L (ref 3.5–5.2)
Sodium: 141 mmol/L (ref 134–144)
Total Protein: 7.1 g/dL (ref 6.0–8.5)

## 2018-05-02 LAB — LIPID PANEL
Chol/HDL Ratio: 4.3 ratio (ref 0.0–4.4)
Cholesterol, Total: 211 mg/dL — ABNORMAL HIGH (ref 100–199)
HDL: 49 mg/dL (ref >39)
LDL Calculated: 138 mg/dL — ABNORMAL HIGH (ref 0–99)
Triglycerides: 118 mg/dL (ref 0–149)
VLDL Cholesterol Cal: 24 mg/dL (ref 5–40)

## 2018-05-08 NOTE — Research (Signed)
[facility]-CV Physical Exam   Patient Name: Dana [name]        DOB: 1960-12-10   Normal     Abnormal      [x]                   []        General Condition                                       Describe Abnormal Findings                                         Clinically Significant?    [] Y    [] N       [x]                   []        HEENT                                       Describe Abnormal Findings                                         Clinically Significant?    [] Y    [] N       [x]                   []        Integumentary                                       Describe Abnormal Findings                                        Clinically Significant?    [] Y    [] N      [x]                   []        Musculoskeletal                                       Describe Abnormal Findings                                        Clinically Significant?    [] Y    [] N      [x]                   []        Cardiovascular                                       Describe Abnormal Findings  Clinically Significant?    [] Y    [] N      [x]                   []        Peripheral Vascular                                       Describe Abnormal Findings                                        Clinically Significant?    [] Y    [] N      [x]                   []        Respiratory                                       Describe Abnormal Findings                                        Clinically Significant?    [] Y    [] N      [x]                   []        Gastrointestinal                                       Describe Abnormal Findings                                        Clinically Significant?    [] Y    [] N      []                   [x]        Neurological   Sensory neuropathy lower extremities                                       Describe Abnormal Findings                                        Clinically Significant?    [] Y    [x] N      []                   []         Renal/Urinary                                       Describe Abnormal Findings  Clinically Significant?    [] Y    [] N      []                   [x]        Other (check normal if no other findings)                                       Describe Abnormal Findings  Missing LLE digits x2                                        Clinically Significant?    [] Y    [] N  Modified Rankin Scale         [x] 0 - No Symptoms [] 1 - No significant disability despite symptoms; able to carry out all usual duties  and activities [] 2 - Slight disability; unable to carry out all previous activities, but able to look after own affairs without assistance [] 3 - Moderate disability; requiring some help, but able to walk without assistance [] 4 - Moderately severe disability; unable to walk without assistance and unable to  attend to own bodily needs without assistance [] 5 - Severe disability; bedridden, incontinent and requiring constant nursing care and attention [] 6 - Dead  Patient seen and evaluated.  Randomization and study discussed with patient.   Loretha Brasil. Lia Foyer, MD, [place] Director, [facility]

## 2018-05-19 ENCOUNTER — Telehealth (HOSPITAL_COMMUNITY): Payer: Self-pay | Admitting: Adult Health

## 2018-05-19 NOTE — Telephone Encounter (Signed)
   Cardiomems reading trending up over the last 4 days.   She was instructed to take an additional 20 mg torsemide for the next 2 days.    Mrs Lechtenberg verbalized understanding and appreciated the phone call.   Citlally Captain NP-C  1:24 PM

## 2018-05-21 DIAGNOSIS — Z23 Encounter for immunization: Secondary | ICD-10-CM | POA: Diagnosis not present

## 2018-05-28 ENCOUNTER — Ambulatory Visit (HOSPITAL_COMMUNITY)
Admission: RE | Admit: 2018-05-28 | Discharge: 2018-05-28 | Disposition: A | Discharge status: Home / Self Care | Payer: Medicare Other | Source: Ambulatory Visit | Attending: Internal Medicine | Admitting: Internal Medicine

## 2018-05-28 ENCOUNTER — Other Ambulatory Visit: Payer: Self-pay

## 2018-05-28 VITALS — BP 185/64 | HR 93 | Wt 253.2 lb

## 2018-05-28 DIAGNOSIS — Z886 Allergy status to analgesic agent status: Secondary | ICD-10-CM | POA: Diagnosis not present

## 2018-05-28 DIAGNOSIS — Z79899 Other long term (current) drug therapy: Secondary | ICD-10-CM | POA: Diagnosis not present

## 2018-05-28 DIAGNOSIS — Z7982 Long term (current) use of aspirin: Secondary | ICD-10-CM | POA: Insufficient documentation

## 2018-05-28 DIAGNOSIS — Z9889 Other specified postprocedural states: Secondary | ICD-10-CM | POA: Insufficient documentation

## 2018-05-28 DIAGNOSIS — I2510 Atherosclerotic heart disease of native coronary artery without angina pectoris: Secondary | ICD-10-CM | POA: Diagnosis not present

## 2018-05-28 DIAGNOSIS — E785 Hyperlipidemia, unspecified: Secondary | ICD-10-CM | POA: Diagnosis not present

## 2018-05-28 DIAGNOSIS — I130 Hypertensive heart and chronic kidney disease with heart failure and stage 1 through stage 4 chronic kidney disease, or unspecified chronic kidney disease: Secondary | ICD-10-CM | POA: Diagnosis not present

## 2018-05-28 DIAGNOSIS — Z7902 Long term (current) use of antithrombotics/antiplatelets: Secondary | ICD-10-CM | POA: Insufficient documentation

## 2018-05-28 DIAGNOSIS — E669 Obesity, unspecified: Secondary | ICD-10-CM | POA: Insufficient documentation

## 2018-05-28 DIAGNOSIS — I5030 Unspecified diastolic (congestive) heart failure: Secondary | ICD-10-CM | POA: Diagnosis not present

## 2018-05-28 DIAGNOSIS — G4733 Obstructive sleep apnea (adult) (pediatric): Secondary | ICD-10-CM | POA: Insufficient documentation

## 2018-05-28 DIAGNOSIS — R55 Syncope and collapse: Secondary | ICD-10-CM | POA: Diagnosis not present

## 2018-05-28 DIAGNOSIS — I480 Paroxysmal atrial fibrillation: Secondary | ICD-10-CM | POA: Insufficient documentation

## 2018-05-28 DIAGNOSIS — Z888 Allergy status to other drugs, medicaments and biological substances status: Secondary | ICD-10-CM | POA: Insufficient documentation

## 2018-05-28 DIAGNOSIS — Z6841 Body Mass Index (BMI) 40.0 and over, adult: Secondary | ICD-10-CM | POA: Insufficient documentation

## 2018-05-28 DIAGNOSIS — Z8249 Family history of ischemic heart disease and other diseases of the circulatory system: Secondary | ICD-10-CM | POA: Insufficient documentation

## 2018-05-28 DIAGNOSIS — E1022 Type 1 diabetes mellitus with diabetic chronic kidney disease: Secondary | ICD-10-CM | POA: Insufficient documentation

## 2018-05-28 DIAGNOSIS — Z955 Presence of coronary angioplasty implant and graft: Secondary | ICD-10-CM | POA: Insufficient documentation

## 2018-05-28 DIAGNOSIS — Z794 Long term (current) use of insulin: Secondary | ICD-10-CM | POA: Diagnosis not present

## 2018-05-28 DIAGNOSIS — E1040 Type 1 diabetes mellitus with diabetic neuropathy, unspecified: Secondary | ICD-10-CM | POA: Insufficient documentation

## 2018-05-28 DIAGNOSIS — I5032 Chronic diastolic (congestive) heart failure: Secondary | ICD-10-CM | POA: Diagnosis not present

## 2018-05-28 DIAGNOSIS — D631 Anemia in chronic kidney disease: Secondary | ICD-10-CM | POA: Insufficient documentation

## 2018-05-28 DIAGNOSIS — Z87891 Personal history of nicotine dependence: Secondary | ICD-10-CM | POA: Insufficient documentation

## 2018-05-28 DIAGNOSIS — N183 Chronic kidney disease, stage 3 unspecified: Secondary | ICD-10-CM

## 2018-05-28 LAB — BASIC METABOLIC PANEL
Anion gap: 8 (ref 5–15)
BUN: 33 mg/dL — ABNORMAL HIGH (ref 6–20)
CO2: 27 mmol/L (ref 22–32)
Calcium: 8.8 mg/dL — ABNORMAL LOW (ref 8.9–10.3)
Chloride: 103 mmol/L (ref 98–111)
Creatinine, Ser: 1.37 mg/dL — ABNORMAL HIGH (ref 0.44–1.00)
GFR calc Af Amer: 49 mL/min — ABNORMAL LOW (ref >60)
GFR calc non Af Amer: 43 mL/min — ABNORMAL LOW (ref >60)
Glucose, Bld: 155 mg/dL — ABNORMAL HIGH (ref 70–99)
Potassium: 3.8 mmol/L (ref 3.5–5.1)
Sodium: 138 mmol/L (ref 135–145)

## 2018-05-28 MED ORDER — CARVEDILOL 6.25 MG PO TABS: 9.3750 mg | ORAL_TABLET | Freq: Two times a day (BID) | ORAL | 3 refills | Status: DC

## 2018-05-28 MED ORDER — HYDRALAZINE HCL 50 MG PO TABS: 75.0000 mg | ORAL_TABLET | Freq: Three times a day (TID) | ORAL | 3 refills | Status: DC

## 2018-05-28 NOTE — Patient Instructions (Signed)
Labs done today  INCREASE  Hydralazine 75 mg (1.5 tab) three times daily  INCREASE Carvedilol 9.375 mg (1.5 tab) twice daily  Keep a log of your blood pressures (1 time a day) for two weeks and call the clinic to report your blood pressures. [phone] option #2  Follow up in 3 months with [facility]

## 2018-05-28 NOTE — Progress Notes (Signed)
ADVANCED HF CLINIC  NOTE  Referring Physician: Dr Bettina Gavia  Primary Care: Dr Tobie Poet in [place] Primary Cardiologist: Dr Bettina Gavia  Pulmonary: Dr Alcide Clever HF: Dr [name]  HPI: Dana [name] is a 57 y.o. female with a history of obesity, diastolic heart failure, DMI, neuropathy, HTN, hyperlipidemia, CAD (most recent [facility] 08/2016 DES RCA and stent LAD total of 4 stents), anemia, OSA --> started CPAP 12/2016, PAF S/P ablation, PAD S/P L great toe and 4th toe amputation.   Admitted 4/24 - 11/11/17 with chest pain while getting up out of her recliner.  EKG unremarkable. CXR with pulmonary edema. Troponin  0.03 - 0.05. Diuresed with IV lasix with resolution of symptoms. Echo with normal EF as below. Torsemide cut back to 60 mg daily on discharge.   On June 25th she was evaluated in the ED for syncope. She was in her usual state with no complaints. She was standing at the sink and had high pitch sound in her ears. Her husband found her on kitchen floor. She was evaluated in ED and later sent home.   Today she returns for HF follow up. Overall feeling fine.SBP at home has been high.  Denies SOB/PND/Orthopnea. Appetite ok. No fever or chills. Weight at home 250-252 pounds. She has started exercising. She attend water aerobics.  Taking all medications.  Zio patch 02/2018 with no arrhythmias, only occasional PACs/PVCs  Cardiomems numbers reviewed: Reading 6 mm hg.  - Echo 11/07/17 LVEF 65-70%, Grade 2 DD. Trivial MR, Mild LAE. - ECHO 08/2016 EF 55% RV was not clearly visualized  RHC 03/11/18 - Cardiomems device placed RA mean 10 RV 49/3 PA 50/19 (31) PCWP 16 Cardiac Output (Fick) 8.3 Cardiac Index (Fick) 3.88  Studies RHC/LHC 01/07/2017  Stents patent RA mean 7 RV 31/6 PA 31/10, mean 20 PCWP mean 7 LV 142/12 AO 134/64 Oxygen saturations: PA 66% AO 99% Cardiac Output (Fick) 7.31  Cardiac Index (Fick) 3.37 Left Main  No significant disease.  Left Anterior Descending  30% ostial stenosis.  30-40% proximal stenosis. Patent mid LAD stent. 40-50% mid LAD stenosis distal to stent.  Left Circumflex  Large OM1. 30% mid LCx stenosis after OM1. Moderate OM2 with 30-40% proximal stenosis. 30% distal LCx stenosis.  Right Coronary Artery  Serial 30-40% stenoses in the proximal RCA. Long stented segment in the PDA was patent.    Cardiac cath [facility] 08/30/16:  2. Severe stenosis of the distal RCA extending into the rPDA. 3. Patent stent in the LAD. 4. Moderate macrovascular disease elsewhere. 5. Diffuse microvascular disease seen. 6. Mild pulmonary hypertension. 7. Normal LVEDP. 8. Normal cardiac output. Interventional Summary Successful PCI / Xience Drug Eluting Stent (2.75/23 mm) of the distal Right RA 7 PCW 14 CO/CO 6.3/3  CTA 08/2016 --. No PE    Review of systems complete and found to be negative unless listed in HPI.    Past Medical History:  Diagnosis Date  . Anginal pain ([place])   . CAD (coronary artery disease)    08/2016 DES to RCA, also with history of LAD stenting.   . Diabetes ([place])   . Diastolic CHF ([place])   . Hyperlipidemia   . Hypertension   . OSA (obstructive sleep apnea)     Current Outpatient Medications  Medication Sig Dispense Refill  . albuterol (ACCUNEB) 0.63 MG/3ML nebulizer solution Take 1 ampule by nebulization every 6 (six) hours as needed for wheezing or shortness of breath.     Marland Kitchen albuterol (PROVENTIL HFA;VENTOLIN HFA) 108 (  90 Base) MCG/ACT inhaler Inhale 2 puffs into the lungs every 6 (six) hours as needed for wheezing or shortness of breath.     Marland Kitchen aspirin EC 81 MG tablet Take 1 tablet (81 mg total) by mouth daily. 30 tablet 11  . carvedilol (COREG) 6.25 MG tablet Take 1 tablet (6.25 mg total) by mouth 2 (two) times daily. 60 tablet 3  . clopidogrel (PLAVIX) 75 MG tablet Take 75 mg by mouth at bedtime.     . eszopiclone (LUNESTA) 2 MG TABS tablet Take 2 mg by mouth at bedtime.     . ferrous sulfate 325 (65 FE) MG tablet Take 325 mg by mouth 2  (two) times daily with a meal.    . hydrALAZINE (APRESOLINE) 50 MG tablet Take 1 tablet (50 mg total) by mouth 3 (three) times daily. 90 tablet 3  . HYDROcodone-acetaminophen (NORCO) 7.5-325 MG tablet Take 1 tablet by mouth 3 (three) times daily as needed for moderate pain.     Marland Kitchen insulin NPH Human (HUMULIN N,NOVOLIN N) 100 UNIT/ML injection Inject 30 Units into the skin 2 (two) times daily before a meal.     . insulin regular (NOVOLIN R,HUMULIN R) 100 units/mL injection Inject 7-12 Units into the skin 3 (three) times daily before meals. Per sliding scale    . isosorbide mononitrate (IMDUR) 30 MG 24 hr tablet Take 0.5 tablets (15 mg total) by mouth daily. 15 tablet 6  . latanoprost (XALATAN) 0.005 % ophthalmic solution Place 1 drop into both eyes at bedtime.    . magnesium oxide (MAG-OX) 400 MG tablet Take 2 tablets (800 mg total) by mouth daily. Take 2 (400 mg) daily=800 mg 60 tablet 1  . metolazone (ZAROXOLYN) 2.5 MG tablet TAKE 1 TABLET BY MOUTH ONCE A WEEK. TAKE EVERY WEDNESDAY (Patient taking differently: Take 2.5 mg by mouth See admin instructions. Take 2.5 mg by mouth once weekly on Wednesday. May take additional 2.5 mg dose on Saturday if needed for fluid) 10 tablet 4  . nitroGLYCERIN (NITROSTAT) 0.4 MG SL tablet Place 0.4 mg under the tongue every 5 (five) minutes as needed for chest pain.     . Omega-3 Fatty Acids (FISH OIL PO) Take 1 capsule by mouth daily.    . OXYGEN Inhale 2 L into the lungs at bedtime.    . pravastatin (PRAVACHOL) 40 MG tablet Take 80 mg by mouth at bedtime.     . Probiotic CAPS Take 1 capsule by mouth daily.    . promethazine (PHENERGAN) 25 MG tablet Take 25 mg by mouth every 6 (six) hours as needed for nausea or vomiting.    Marland Kitchen spironolactone (ALDACTONE) 25 MG tablet Take 1 tablet (25 mg total) by mouth daily. 30 tablet 3  . torsemide (DEMADEX) 20 MG tablet Take 2 tablets (40 mg total) by mouth every morning AND 2 tablets (40 mg total) daily with lunch AND 1 tablet (20  mg total) every evening. (Patient taking differently: Take 2 tablets (40 mg total) by mouth every morning AND 2 tablets (40 mg total) daily with lunch AND 2 tablet (40 mg total) every evening.) 120 tablet 3  . Vitamin D, Ergocalciferol, (DRISDOL) 50000 units CAPS capsule Take 50,000 Units by mouth every Wednesday.      No current facility-administered medications for this encounter.     Allergies  Allergen Reactions  . Naproxen Hives and Rash  . Celecoxib Other (See Comments)    Stomach pain  . Aspirin Other (See Comments)  CAN NOT TAKE DUE TO ULCERS  . Glucosamine Hives, Swelling and Other (See Comments)    Angioedema   . Metoclopramide Other (See Comments)    Facial twitching and stuttering  . Metolazone Other (See Comments)    Acute renal failure  . Shellfish-Derived Products Hives, Swelling and Other (See Comments)    Angioedema   . Tramadol Nausea And Vomiting           Social History   Socioeconomic History  . Marital status: Married    Spouse name: Not on file  . Number of children: Not on file  . Years of education: Not on file  . Highest education level: Not on file  Occupational History  . Not on file  Social Needs  . Financial resource strain: Not on file  . Food insecurity:    Worry: Not on file    Inability: Not on file  . Transportation needs:    Medical: Not on file    Non-medical: Not on file  Tobacco Use  . Smoking status: Former Research scientist (life sciences)  . Smokeless tobacco: Never Used  Substance and Sexual Activity  . Alcohol use: Not Currently  . Drug use: Not Currently  . Sexual activity: Not on file  Lifestyle  . Physical activity:    Days per week: Not on file    Minutes per session: Not on file  . Stress: Not on file  Relationships  . Social connections:    Talks on phone: Not on file    Gets together: Not on file    Attends religious service: Not on file    Active member of club or organization: Not on file    Attends meetings of clubs or  organizations: Not on file    Relationship status: Not on file  . Intimate partner violence:    Fear of current or ex partner: Not on file    Emotionally abused: Not on file    Physically abused: Not on file    Forced sexual activity: Not on file  Other Topics Concern  . Not on file  Social History Narrative  . Not on file    FamHx:  Denies h/o familial CM or SCD. Multiple family members with CAD.   Vitals:   05/28/18 0832  BP: (!) 185/64  Pulse: 93  SpO2: 99%  Weight: 114.9 kg (253 lb 3.2 oz)   Wt Readings from Last 3 Encounters:  05/28/18 114.9 kg (253 lb 3.2 oz)  04/23/18 112.8 kg (248 lb 9.6 oz)  04/04/18 111.9 kg (246 lb 12.8 oz)    PHYSICAL EXAM: General:  Well appearing. No resp difficulty HEENT: normal Neck: supple. no JVD. Carotids 2+ bilat; no bruits. No lymphadenopathy or thryomegaly appreciated. Cor: PMI nondisplaced. Regular rate & rhythm. No rubs, gallops or murmurs. Lungs: clear Abdomen: soft, nontender, nondistended. No hepatosplenomegaly. No bruits or masses. Good bowel sounds. Extremities: no cyanosis, clubbing, rash, edema Neuro: alert & orientedx3, cranial nerves grossly intact. moves all 4 extremities w/o difficulty. Affect pleasant Neuro: Alert & orientedx3, cranial nerves grossly intact. moves all 4 extremities w/o difficulty. Affect pleasant   ASSESSMENT & PLAN:  1. Chronic Diastolic Heart Failure - Echo 11/07/17 LVEF 65-70%, Grade 2 DD - s/p Cardiomems device 03/11/18. Reading 6 mm Hg today  - NYHA II.  Volume status stable.   - Continue torsemide 40 mg am/20 mg pm. - Continue metolazone 2.5 mg on Wednesday.  - Increase coreg to 9.375 mg twice a day.   -  Increase hydralazine to 50 mg TID - Continue imdur 15 mg daily.  - Continue spiro 25 mg daily.  2. Obesity - Body mass index is 43.46 kg/m.  - Discussed weight loss and portion control.  3. OSA - Continue CPAP. No change.   4. CAD - 4 stents. 08/2016  stent RCA.  -  Recent LHC  nonobstructive.  -No s/s ischemia  - On plavix, bb, and statin.  5. PAF - S/P ablation 2016. -Regular pulse   - Stressed compliance with CPAP - Holter monitor 06/2017 with occasional PACs and SVT.  - BB as above.   7. HTN:  -Elevated. Increase hydralazine and carvedilol as above.  8. CKD III - Baseline creatinine appears to be 1.3 - 1.5 -Check BMET  9. Syncope - Unexplained syncope in June 2019. Does not sound like vasovagal or orthostasis.  - She understands she cant drive for 6 months (through December 2019) - Zio patch 02/2018 with no arrhythmias, only occasional PACs/PVCs.  Check BMET.   Follow up in 3 months.    Darrick Grinder, NP  8:40 AM

## 2018-05-29 DIAGNOSIS — R5383 Other fatigue: Secondary | ICD-10-CM | POA: Diagnosis not present

## 2018-05-29 DIAGNOSIS — E038 Other specified hypothyroidism: Secondary | ICD-10-CM | POA: Diagnosis not present

## 2018-05-29 DIAGNOSIS — F5101 Primary insomnia: Secondary | ICD-10-CM | POA: Diagnosis not present

## 2018-05-29 DIAGNOSIS — I119 Hypertensive heart disease without heart failure: Secondary | ICD-10-CM | POA: Diagnosis not present

## 2018-05-29 DIAGNOSIS — Z6841 Body Mass Index (BMI) 40.0 and over, adult: Secondary | ICD-10-CM | POA: Diagnosis not present

## 2018-05-29 DIAGNOSIS — L508 Other urticaria: Secondary | ICD-10-CM | POA: Diagnosis not present

## 2018-05-29 DIAGNOSIS — Z89422 Acquired absence of other left toe(s): Secondary | ICD-10-CM | POA: Diagnosis not present

## 2018-05-29 DIAGNOSIS — E782 Mixed hyperlipidemia: Secondary | ICD-10-CM | POA: Diagnosis not present

## 2018-05-29 DIAGNOSIS — Z89412 Acquired absence of left great toe: Secondary | ICD-10-CM | POA: Diagnosis not present

## 2018-05-29 DIAGNOSIS — E1142 Type 2 diabetes mellitus with diabetic polyneuropathy: Secondary | ICD-10-CM | POA: Diagnosis not present

## 2018-05-29 DIAGNOSIS — I5022 Chronic systolic (congestive) heart failure: Secondary | ICD-10-CM | POA: Diagnosis not present

## 2018-05-29 DIAGNOSIS — I2510 Atherosclerotic heart disease of native coronary artery without angina pectoris: Secondary | ICD-10-CM | POA: Diagnosis not present

## 2018-06-02 DIAGNOSIS — L89620 Pressure ulcer of left heel, unstageable: Secondary | ICD-10-CM | POA: Diagnosis not present

## 2018-06-09 DIAGNOSIS — G4733 Obstructive sleep apnea (adult) (pediatric): Secondary | ICD-10-CM | POA: Diagnosis not present

## 2018-06-10 DIAGNOSIS — G4733 Obstructive sleep apnea (adult) (pediatric): Secondary | ICD-10-CM | POA: Diagnosis not present

## 2018-06-10 DIAGNOSIS — J4520 Mild intermittent asthma, uncomplicated: Secondary | ICD-10-CM | POA: Diagnosis not present

## 2018-06-10 DIAGNOSIS — J301 Allergic rhinitis due to pollen: Secondary | ICD-10-CM | POA: Diagnosis not present

## 2018-06-16 ENCOUNTER — Encounter (HOSPITAL_COMMUNITY): Payer: Self-pay | Admitting: Adult Health

## 2018-06-17 ENCOUNTER — Telehealth (HOSPITAL_COMMUNITY): Payer: Self-pay | Admitting: Adult Health

## 2018-06-17 NOTE — Telephone Encounter (Signed)
Late entry    I called [facility] on 12/2 regarding elevated cardiomems.   She was instructed to take an extra torsemide for 2 days.   [place] verbalized understanding.   Amy Clegg NP-C  4:59 PM

## 2018-06-24 ENCOUNTER — Ambulatory Visit (INDEPENDENT_AMBULATORY_CARE_PROVIDER_SITE_OTHER): Payer: Medicare Other | Admitting: Allergy

## 2018-06-24 ENCOUNTER — Encounter: Payer: Self-pay | Admitting: Allergy

## 2018-06-24 ENCOUNTER — Encounter: Payer: Self-pay | Admitting: Family Medicine

## 2018-06-24 ENCOUNTER — Telehealth: Payer: Self-pay

## 2018-06-24 VITALS — BP 140/64 | HR 84 | Temp 98.5°F | Resp 18 | Ht 64.5 in | Wt 254.8 lb

## 2018-06-24 DIAGNOSIS — T781XXD Other adverse food reactions, not elsewhere classified, subsequent encounter: Secondary | ICD-10-CM | POA: Diagnosis not present

## 2018-06-24 DIAGNOSIS — T783XXD Angioneurotic edema, subsequent encounter: Secondary | ICD-10-CM

## 2018-06-24 DIAGNOSIS — L508 Other urticaria: Secondary | ICD-10-CM

## 2018-06-24 DIAGNOSIS — Z1231 Encounter for screening mammogram for malignant neoplasm of breast: Secondary | ICD-10-CM | POA: Diagnosis not present

## 2018-06-24 DIAGNOSIS — I200 Unstable angina: Secondary | ICD-10-CM | POA: Diagnosis not present

## 2018-06-24 LAB — HM MAMMOGRAPHY: HM Mammogram: NORMAL (ref 0–4)

## 2018-06-24 MED ORDER — FAMOTIDINE 20 MG PO TABS: 20.0000 mg | ORAL_TABLET | Freq: Every day | ORAL | 5 refills | Status: DC

## 2018-06-24 MED ORDER — EPINEPHRINE 0.3 MG/0.3ML IJ SOAJ: INTRAMUSCULAR | 3 refills | Status: DC

## 2018-06-24 NOTE — Telephone Encounter (Signed)
Hey ladies,  This patient came in today as a new patient. She has MCR and [employer] Supplement. Our system and [employer] online system kept saying the patients insurance was terminated. I called [employer] and they had already put terminated in since the patient was switching insurances the first of the year. She does still have the insurance till 07-15-2018. I put this in the insurance section with the term date.   Thanks

## 2018-06-24 NOTE — Patient Instructions (Addendum)
Hives  -  at this time etiology of hives and swelling is unknown.  Hives can be caused by a variety of different triggers including illness/infection, foods, medications, stings, exercise, pressure, vibrations, extremes of temperature to name a few however majority of the time there is no identifiable trigger.  Your symptoms have been ongoing for >6 weeks making this chronic thus will obtain labwork to evaluate: tryptase, hive panel, alpha-gal panel  - recent labs obtained by Dr. Tobie Poet including CBC and CMP did show elevated kidney function levels indicative of chronic kidney disease.     - will have you start Claritin 10mg  daily with Pepcid 20mg  daily  - we did discuss adding Singulair to regimen if needed if above regimen is not effective enough  - we discussed Xolair monthly injection for control of hives if high dose antihistamine regimen is not effective enough.    - environmental allergy skin testing today is positive to dust mites and cockroach.  Allergen avoidance measures discussed/handouts provided   - shellfish panel skin testing is positive to shrimp, lobster, oyster thus continue avoidance of all shellfish.  Have access to self-injectable epinephrine Epipen 0.3mg  at all times.  Follow emergency action plan in case of allergic reaction  Follow-up 2-3 months or sooner if needed

## 2018-06-24 NOTE — Progress Notes (Signed)
New Patient Note  RE: KHARTER SESTAK MRN: [id number] DOB: 08-19-1960 Date of Office Visit: 06/24/2018  Referring provider: [name], MD Primary care provider: [name], MD  Chief Complaint: hives  History of present illness: Dana [name] is a 57 y.o. female presenting today for consultation for urticaria.  She has a complex medical history including coronary artery disease, A. fib, COPD, diabetes on insulin, kidney disease.    She states she has been breaking out in a red itchy rash that she describes as hives.  The hives started about 4 months ago and states she has had 5 episodes in that timeframe.  The hives are itchy.  She states the itchiness starts in her scalp and moves down and then she will notice the rash.  She states she does note "a little swelling" mostly with puffy eyes.  She has joints aches/pains at baseline but not worse with rash.  No fever.  No preceding illnesses.  No new foods, no new medications/change in dose, no stings, no change in soaps/lotions/detergents.  Denies any exacerbating factors.  The hives last about 2-3 hours and do not leave behind any marks/bruising.  She has used calamine lotion that helps a little bit with the itch.    She states about 5 years ago she ate shrimp at a restaurant and she developed hives after ingestion.  Due to this she thinks she has a shellfish allergy and has been avoiding shellfish.  She states she did have the rash several weeks later without any shellfish ingestion.    When she takes naproxen she develops itchy, hive-like rash and swelling.   She states she avoid all NSAIDs (except for aspirin) moreso due to her heart issues.  The aspirin does not have any effect on her hives..  She takes tylenol only for pain control without issue.  She also states she was told to avoid Benadryl due to her heart issues.  She is on oxycodone as needed for pain control but states it does not make her itchier.   Review of  systems: Review of Systems  Constitutional: Negative for chills, fever and malaise/fatigue.  HENT: Negative for congestion, ear discharge, nosebleeds and sore throat.   Eyes: Negative for pain, discharge and redness.  Respiratory: Negative for cough, shortness of breath and wheezing.   Cardiovascular: Negative for chest pain.  Gastrointestinal: Negative for abdominal pain, constipation, diarrhea, heartburn, nausea and vomiting.  Musculoskeletal: Positive for joint pain.  Skin: Positive for itching and rash.  Neurological: Negative for headaches.    All other systems negative unless noted above in HPI  Past medical history: Past Medical History:  Diagnosis Date  . Anginal pain ([place])   . CAD (coronary artery disease)    08/2016 DES to RCA, also with history of LAD stenting.   . Diabetes ([place])   . Diastolic CHF ([place])   . Hyperlipidemia   . Hypertension   . OSA (obstructive sleep apnea)     Past surgical history: Past Surgical History:  Procedure Laterality Date  . CARPAL TUNNEL RELEASE    . CESAREAN SECTION    . CHOLECYSTECTOMY    . EYE SURGERY    . RIGHT/LEFT HEART CATH AND CORONARY ANGIOGRAPHY N/A 01/07/2017   Procedure: Right/Left Heart Cath and Coronary Angiography;  Surgeon: Larey Dresser, MD;  Location: [facility];  Service: Cardiovascular;  Laterality: N/A;  . ROTATOR CUFF REPAIR    . TOE AMPUTATION Left  Family history:  Family History  Problem Relation Age of Onset  . Hypertension Father     Social history: She lives in a home without carpeting with gas heating and window cooling.  There are 4 cats, 1 bird and occasional dog in the home.  There is no concern for water damage, mildew or roaches in the home.  This time does not work.  Former smoker with quit date in 2006 and she reports smoking half a pack per day at the time.   Medication List: Allergies as of 06/24/2018      Reactions   Naproxen Hives, Rash   Celecoxib Other (See Comments)    Stomach pain   Aspirin Other (See Comments)   CAN NOT TAKE DUE TO ULCERS   Glucosamine Hives, Swelling, Other (See Comments)   Angioedema   Metoclopramide Other (See Comments)   Facial twitching and stuttering   Metolazone Other (See Comments)   Acute renal failure   Shellfish-derived Products Hives, Swelling, Other (See Comments)   Angioedema   Tramadol Nausea And Vomiting         Medication List        Accurate as of 06/24/18 11:59 AM. Always use your most recent med list.          albuterol 108 (90 Base) MCG/ACT inhaler Commonly known as:  PROVENTIL HFA;VENTOLIN HFA Inhale 2 puffs into the lungs every 6 (six) hours as needed for wheezing or shortness of breath.   albuterol 0.63 MG/3ML nebulizer solution Commonly known as:  ACCUNEB Take 1 ampule by nebulization every 6 (six) hours as needed for wheezing or shortness of breath.   aspirin EC 81 MG tablet Take 1 tablet (81 mg total) by mouth daily.   carvedilol 6.25 MG tablet Commonly known as:  COREG Take 1.5 tablets (9.375 mg total) by mouth 2 (two) times daily.   clopidogrel 75 MG tablet Commonly known as:  PLAVIX Take 75 mg by mouth at bedtime.   eszopiclone 2 MG Tabs tablet Commonly known as:  LUNESTA Take 2 mg by mouth at bedtime.   ferrous sulfate 325 (65 FE) MG tablet Take 325 mg by mouth 2 (two) times daily with a meal.   FISH OIL PO Take 1 capsule by mouth daily.   hydrALAZINE 50 MG tablet Commonly known as:  APRESOLINE Take 1.5 tablets (75 mg total) by mouth 3 (three) times daily.   HYDROcodone-acetaminophen 7.5-325 MG tablet Commonly known as:  NORCO Take 1 tablet by mouth 3 (three) times daily as needed for moderate pain.   insulin NPH Human 100 UNIT/ML injection Commonly known as:  HUMULIN N,NOVOLIN N Inject 30 Units into the skin 2 (two) times daily before a meal.   insulin regular 100 units/mL injection Commonly known as:  NOVOLIN R,HUMULIN R Inject 7-12 Units into the skin 3 (three)  times daily before meals. Per sliding scale   isosorbide mononitrate 30 MG 24 hr tablet Commonly known as:  IMDUR Take 0.5 tablets (15 mg total) by mouth daily.   latanoprost 0.005 % ophthalmic solution Commonly known as:  XALATAN Place 1 drop into both eyes at bedtime.   magnesium oxide 400 MG tablet Commonly known as:  MAG-OX Take 2 tablets (800 mg total) by mouth daily. Take 2 (400 mg) daily=800 mg   metolazone 2.5 MG tablet Commonly known as:  ZAROXOLYN TAKE 1 TABLET BY MOUTH ONCE A WEEK. TAKE EVERY WEDNESDAY   nitroGLYCERIN 0.4 MG SL tablet Commonly known as:  NITROSTAT  Place 0.4 mg under the tongue every 5 (five) minutes as needed for chest pain.   OXYGEN Inhale 2 L into the lungs at bedtime.   pravastatin 40 MG tablet Commonly known as:  PRAVACHOL Take 80 mg by mouth at bedtime.   Probiotic Caps Take 1 capsule by mouth daily.   promethazine 25 MG tablet Commonly known as:  PHENERGAN Take 25 mg by mouth every 6 (six) hours as needed for nausea or vomiting.   spironolactone 25 MG tablet Commonly known as:  ALDACTONE Take 1 tablet (25 mg total) by mouth daily.   torsemide 20 MG tablet Commonly known as:  DEMADEX Take 2 tablets (40 mg total) by mouth every morning AND 2 tablets (40 mg total) daily with lunch AND 1 tablet (20 mg total) every evening.   Vitamin D (Ergocalciferol) 1.25 MG (50000 UT) Caps capsule Commonly known as:  DRISDOL Take 50,000 Units by mouth every Wednesday.       Known medication allergies: Allergies  Allergen Reactions  . Naproxen Hives and Rash  . Celecoxib Other (See Comments)    Stomach pain  . Aspirin Other (See Comments)    CAN NOT TAKE DUE TO ULCERS  . Glucosamine Hives, Swelling and Other (See Comments)    Angioedema   . Metoclopramide Other (See Comments)    Facial twitching and stuttering  . Metolazone Other (See Comments)    Acute renal failure  . Shellfish-Derived Products Hives, Swelling and Other (See Comments)     Angioedema   . Tramadol Nausea And Vomiting          Physical examination: Blood pressure 140/64, pulse 84, temperature 98.5 F (36.9 C), temperature source Oral, resp. rate 18, height 5' 4.5" (1.638 m), weight 254 lb 12.8 oz (115.6 kg).  General: Alert, interactive, in no acute distress. HEENT: PERRLA, TMs pearly gray, turbinates minimally edematous Rhythm bloody scab to the lateral edge of her nares (patient states she sleeps with oxygen), post-pharynx non erythematous. Neck: Supple without lymphadenopathy. Lungs: Clear to auscultation without wheezing, rhonchi or rales. {no increased work of breathing. CV: Normal S1, S2 without murmurs. Abdomen: Nondistended, nontender. Skin: Warm and dry, without lesions or rashes. Extremities:  No clubbing, cyanosis or edema. Neuro:   Grossly intact.  Diagnositics/Labs: Labs:  Component     Latest Ref Rng & Units 05/28/2018  Sodium     135 - 145 mmol/L 138  Potassium     3.5 - 5.1 mmol/L 3.8  Chloride     98 - 111 mmol/L 103  CO2     22 - 32 mmol/L 27  Glucose     70 - 99 mg/dL 155 (H)  BUN     6 - 20 mg/dL 33 (H)  Creatinine     0.44 - 1.00 mg/dL 1.37 (H)  Calcium     8.9 - 10.3 mg/dL 8.8 (L)  GFR, Est Non African American     >60 mL/min 43 (L)  GFR, Est African American     >60 mL/min 49 (L)  Anion gap     5 - 15 8   Review of additional labs performed 05/30/2018 shows a CBC with differential remarkable for hemoglobin level of 9 with hematocrit of 28.8 with a normal eosinophil count.  CMP with elevated glucose of 129, creatinine of 1.36 and alkaline phosphatase of 160  Allergy testing: Environmental allergy skin prick testing is positive to dust mites and cockroach Shellfish panel skin prick testing is positive to shrimp,  lobster and oyster Allergy testing results were read and interpreted by provider, documented by clinical staff.  Assessment and plan:   Chronic urticaria with angioedema  -  at this time etiology  of hives and swelling is unknown.  Hives can be caused by a variety of different triggers including illness/infection, foods, medications, stings, exercise, pressure, vibrations, extremes of temperature to name a few however majority of the time there is no identifiable trigger.  Your symptoms have been ongoing for >6 weeks making this chronic thus will obtain labwork to evaluate: tryptase, hive panel, alpha-gal panel  - recent labs obtained by Dr. Tobie Poet including CBC and CMP did show elevated kidney function levels indicative of chronic kidney disease.     - will have you start Claritin 10mg  daily with Pepcid 20mg  daily.  Decision to stop of Claritin as it is metabolized via the liver  - we did discuss adding Singulair to regimen if needed if above regimen is not effective enough  - we discussed Xolair monthly injection for control of hives if high dose antihistamine regimen is not effective enough.    - environmental allergy skin testing today is positive to dust mites and cockroach.  Allergen avoidance measures discussed/handouts provided   - shellfish panel skin testing is positive to shrimp, lobster, oyster thus continue avoidance of all shellfish.  Have access to self-injectable epinephrine Epipen 0.3mg  at all times.  Follow emergency action plan in case of allergic reaction  Adverse food reaction  -As above shellfish panel was positive and she will continue avoidance of access to an epinephrine device  Follow-up 2-3 months or sooner if needed  I appreciate the opportunity to take part in Hanae's care. Please do not hesitate to contact me with questions.  Sincerely,   Prudy Feeler, MD Allergy/Immunology [facility] [place] [facility] [place]

## 2018-06-26 NOTE — Telephone Encounter (Signed)
Thanks [place] for the information. We will keep an eye on this.

## 2018-06-28 LAB — ALPHA-GAL PANEL
Alpha Gal IgE*: <0.1 kU/L (ref <0.10)
Beef (Bos spp) IgE: <0.1 kU/L (ref <0.35)
Class Interpretation: 0
Class Interpretation: 0
Class Interpretation: 0
Lamb/Mutton (Ovis spp) IgE: <0.1 kU/L (ref <0.35)
Pork (Sus spp) IgE: <0.1 kU/L (ref <0.35)

## 2018-06-28 LAB — SPECIMEN STATUS REPORT

## 2018-06-28 LAB — TRYPTASE: Tryptase: 4.7 ug/L (ref 2.2–13.2)

## 2018-07-02 ENCOUNTER — Telehealth (HOSPITAL_COMMUNITY): Payer: Self-pay | Admitting: Adult Health

## 2018-07-02 ENCOUNTER — Encounter (HOSPITAL_COMMUNITY): Payer: Self-pay | Admitting: Adult Health

## 2018-07-02 NOTE — Telephone Encounter (Signed)
Called regarding cardiomems.   PAD up to 15.   Instructed to take an extra 20 mg torsemide for the next 2 days.s   Dana [name] verbalized understanding.   [name]  NP-C  2:53 PM

## 2018-07-03 DIAGNOSIS — J018 Other acute sinusitis: Secondary | ICD-10-CM | POA: Diagnosis not present

## 2018-07-04 ENCOUNTER — Telehealth (HOSPITAL_COMMUNITY): Payer: Self-pay | Admitting: Adult Health

## 2018-07-04 MED ORDER — TORSEMIDE 20 MG PO TABS: ORAL_TABLET | ORAL | 3 refills | Status: DC

## 2018-07-04 NOTE — Telephone Encounter (Signed)
Cardiomems reading elevated to 14.   I called and instructed her to take torsemide 40 mg three times a day.   Dana [name] verbalized understanding.   Maleena Eddleman NP-C  3:47 PM

## 2018-07-07 DIAGNOSIS — J159 Unspecified bacterial pneumonia: Secondary | ICD-10-CM | POA: Diagnosis not present

## 2018-07-07 DIAGNOSIS — E782 Mixed hyperlipidemia: Secondary | ICD-10-CM | POA: Diagnosis not present

## 2018-07-07 DIAGNOSIS — I517 Cardiomegaly: Secondary | ICD-10-CM | POA: Diagnosis not present

## 2018-07-07 DIAGNOSIS — R7989 Other specified abnormal findings of blood chemistry: Secondary | ICD-10-CM | POA: Diagnosis not present

## 2018-07-07 DIAGNOSIS — I214 Non-ST elevation (NSTEMI) myocardial infarction: Secondary | ICD-10-CM | POA: Diagnosis not present

## 2018-07-07 DIAGNOSIS — Z7982 Long term (current) use of aspirin: Secondary | ICD-10-CM | POA: Diagnosis not present

## 2018-07-07 DIAGNOSIS — E1151 Type 2 diabetes mellitus with diabetic peripheral angiopathy without gangrene: Secondary | ICD-10-CM | POA: Diagnosis present

## 2018-07-07 DIAGNOSIS — J189 Pneumonia, unspecified organism: Secondary | ICD-10-CM | POA: Diagnosis not present

## 2018-07-07 DIAGNOSIS — D638 Anemia in other chronic diseases classified elsewhere: Secondary | ICD-10-CM | POA: Diagnosis present

## 2018-07-07 DIAGNOSIS — N179 Acute kidney failure, unspecified: Secondary | ICD-10-CM | POA: Diagnosis not present

## 2018-07-07 DIAGNOSIS — R040 Epistaxis: Secondary | ICD-10-CM | POA: Diagnosis present

## 2018-07-07 DIAGNOSIS — R05 Cough: Secondary | ICD-10-CM | POA: Diagnosis not present

## 2018-07-07 DIAGNOSIS — E119 Type 2 diabetes mellitus without complications: Secondary | ICD-10-CM | POA: Diagnosis not present

## 2018-07-07 DIAGNOSIS — I480 Paroxysmal atrial fibrillation: Secondary | ICD-10-CM | POA: Diagnosis present

## 2018-07-07 DIAGNOSIS — I130 Hypertensive heart and chronic kidney disease with heart failure and stage 1 through stage 4 chronic kidney disease, or unspecified chronic kidney disease: Secondary | ICD-10-CM | POA: Diagnosis not present

## 2018-07-07 DIAGNOSIS — J069 Acute upper respiratory infection, unspecified: Secondary | ICD-10-CM | POA: Diagnosis present

## 2018-07-07 DIAGNOSIS — E1143 Type 2 diabetes mellitus with diabetic autonomic (poly)neuropathy: Secondary | ICD-10-CM | POA: Diagnosis present

## 2018-07-07 DIAGNOSIS — I10 Essential (primary) hypertension: Secondary | ICD-10-CM | POA: Diagnosis not present

## 2018-07-07 DIAGNOSIS — G4733 Obstructive sleep apnea (adult) (pediatric): Secondary | ICD-10-CM | POA: Diagnosis present

## 2018-07-07 DIAGNOSIS — E1165 Type 2 diabetes mellitus with hyperglycemia: Secondary | ICD-10-CM | POA: Diagnosis present

## 2018-07-07 DIAGNOSIS — E1122 Type 2 diabetes mellitus with diabetic chronic kidney disease: Secondary | ICD-10-CM | POA: Diagnosis present

## 2018-07-07 DIAGNOSIS — I340 Nonrheumatic mitral (valve) insufficiency: Secondary | ICD-10-CM | POA: Diagnosis not present

## 2018-07-07 DIAGNOSIS — R0789 Other chest pain: Secondary | ICD-10-CM | POA: Diagnosis not present

## 2018-07-07 DIAGNOSIS — R000 Tachycardia, unspecified: Secondary | ICD-10-CM | POA: Diagnosis not present

## 2018-07-07 DIAGNOSIS — I110 Hypertensive heart disease with heart failure: Secondary | ICD-10-CM | POA: Diagnosis not present

## 2018-07-07 DIAGNOSIS — R9431 Abnormal electrocardiogram [ECG] [EKG]: Secondary | ICD-10-CM | POA: Diagnosis not present

## 2018-07-07 DIAGNOSIS — N183 Chronic kidney disease, stage 3 (moderate): Secondary | ICD-10-CM | POA: Diagnosis present

## 2018-07-07 DIAGNOSIS — E662 Morbid (severe) obesity with alveolar hypoventilation: Secondary | ICD-10-CM | POA: Diagnosis present

## 2018-07-07 DIAGNOSIS — I272 Pulmonary hypertension, unspecified: Secondary | ICD-10-CM | POA: Diagnosis present

## 2018-07-07 DIAGNOSIS — R079 Chest pain, unspecified: Secondary | ICD-10-CM | POA: Diagnosis not present

## 2018-07-07 DIAGNOSIS — I249 Acute ischemic heart disease, unspecified: Secondary | ICD-10-CM | POA: Diagnosis not present

## 2018-07-07 DIAGNOSIS — Z6841 Body Mass Index (BMI) 40.0 and over, adult: Secondary | ICD-10-CM | POA: Diagnosis not present

## 2018-07-07 DIAGNOSIS — I5033 Acute on chronic diastolic (congestive) heart failure: Secondary | ICD-10-CM | POA: Diagnosis not present

## 2018-07-07 DIAGNOSIS — K279 Peptic ulcer, site unspecified, unspecified as acute or chronic, without hemorrhage or perforation: Secondary | ICD-10-CM | POA: Diagnosis present

## 2018-07-07 DIAGNOSIS — K3184 Gastroparesis: Secondary | ICD-10-CM | POA: Diagnosis present

## 2018-07-07 DIAGNOSIS — I2510 Atherosclerotic heart disease of native coronary artery without angina pectoris: Secondary | ICD-10-CM | POA: Diagnosis not present

## 2018-07-07 DIAGNOSIS — I509 Heart failure, unspecified: Secondary | ICD-10-CM | POA: Diagnosis not present

## 2018-07-17 DIAGNOSIS — J181 Lobar pneumonia, unspecified organism: Secondary | ICD-10-CM | POA: Diagnosis not present

## 2018-07-17 DIAGNOSIS — N178 Other acute kidney failure: Secondary | ICD-10-CM | POA: Diagnosis not present

## 2018-07-17 DIAGNOSIS — D6489 Other specified anemias: Secondary | ICD-10-CM | POA: Diagnosis not present

## 2018-07-18 ENCOUNTER — Telehealth (HOSPITAL_COMMUNITY): Payer: Self-pay | Admitting: Adult Health

## 2018-07-18 NOTE — Telephone Encounter (Signed)
   Called regarding elevated cardiomems reading.  She was recently hospitalized at [facility] with chest pain and sob from 12/23 through 07/11/2018.   I have asked her to take metolazone today to lower cardiomems reading. No extra potassium because she had high potassium earlier this week at her PCP. I asked her to eliminate Mrs Deliah Boston. She said she uses Mrs Deliah Boston all the time.   I have asked her to call HF clinic if she feel worse. She verbalized understanding.   Randie Bloodgood NP-C  3:10 PM

## 2018-07-22 DIAGNOSIS — E113593 Type 2 diabetes mellitus with proliferative diabetic retinopathy without macular edema, bilateral: Secondary | ICD-10-CM | POA: Diagnosis not present

## 2018-07-22 DIAGNOSIS — H40051 Ocular hypertension, right eye: Secondary | ICD-10-CM | POA: Diagnosis not present

## 2018-07-22 DIAGNOSIS — H40003 Preglaucoma, unspecified, bilateral: Secondary | ICD-10-CM | POA: Diagnosis not present

## 2018-07-22 LAB — HM DIABETES EYE EXAM

## 2018-07-28 ENCOUNTER — Encounter (HOSPITAL_COMMUNITY): Payer: Self-pay

## 2018-07-28 ENCOUNTER — Ambulatory Visit (HOSPITAL_COMMUNITY)
Admission: RE | Admit: 2018-07-28 | Discharge: 2018-07-28 | Disposition: A | Discharge status: Home / Self Care | Payer: Medicare HMO | Source: Ambulatory Visit | Attending: Internal Medicine | Admitting: Internal Medicine

## 2018-07-28 ENCOUNTER — Other Ambulatory Visit: Payer: Self-pay

## 2018-07-28 VITALS — BP 156/74 | HR 91 | Wt 247.0 lb

## 2018-07-28 DIAGNOSIS — Z87891 Personal history of nicotine dependence: Secondary | ICD-10-CM | POA: Insufficient documentation

## 2018-07-28 DIAGNOSIS — Z8249 Family history of ischemic heart disease and other diseases of the circulatory system: Secondary | ICD-10-CM | POA: Diagnosis not present

## 2018-07-28 DIAGNOSIS — I5032 Chronic diastolic (congestive) heart failure: Secondary | ICD-10-CM

## 2018-07-28 DIAGNOSIS — E669 Obesity, unspecified: Secondary | ICD-10-CM | POA: Insufficient documentation

## 2018-07-28 DIAGNOSIS — Z882 Allergy status to sulfonamides status: Secondary | ICD-10-CM | POA: Diagnosis not present

## 2018-07-28 DIAGNOSIS — I471 Supraventricular tachycardia: Secondary | ICD-10-CM | POA: Insufficient documentation

## 2018-07-28 DIAGNOSIS — I130 Hypertensive heart and chronic kidney disease with heart failure and stage 1 through stage 4 chronic kidney disease, or unspecified chronic kidney disease: Secondary | ICD-10-CM | POA: Insufficient documentation

## 2018-07-28 DIAGNOSIS — Z888 Allergy status to other drugs, medicaments and biological substances status: Secondary | ICD-10-CM | POA: Diagnosis not present

## 2018-07-28 DIAGNOSIS — Z794 Long term (current) use of insulin: Secondary | ICD-10-CM | POA: Diagnosis not present

## 2018-07-28 DIAGNOSIS — I2510 Atherosclerotic heart disease of native coronary artery without angina pectoris: Secondary | ICD-10-CM

## 2018-07-28 DIAGNOSIS — Z7982 Long term (current) use of aspirin: Secondary | ICD-10-CM | POA: Diagnosis not present

## 2018-07-28 DIAGNOSIS — R55 Syncope and collapse: Secondary | ICD-10-CM | POA: Diagnosis not present

## 2018-07-28 DIAGNOSIS — E1022 Type 1 diabetes mellitus with diabetic chronic kidney disease: Secondary | ICD-10-CM | POA: Insufficient documentation

## 2018-07-28 DIAGNOSIS — I5030 Unspecified diastolic (congestive) heart failure: Secondary | ICD-10-CM | POA: Diagnosis not present

## 2018-07-28 DIAGNOSIS — Z7902 Long term (current) use of antithrombotics/antiplatelets: Secondary | ICD-10-CM | POA: Diagnosis not present

## 2018-07-28 DIAGNOSIS — Z6841 Body Mass Index (BMI) 40.0 and over, adult: Secondary | ICD-10-CM | POA: Insufficient documentation

## 2018-07-28 DIAGNOSIS — E785 Hyperlipidemia, unspecified: Secondary | ICD-10-CM | POA: Diagnosis not present

## 2018-07-28 DIAGNOSIS — I5042 Chronic combined systolic (congestive) and diastolic (congestive) heart failure: Secondary | ICD-10-CM | POA: Insufficient documentation

## 2018-07-28 DIAGNOSIS — I480 Paroxysmal atrial fibrillation: Secondary | ICD-10-CM | POA: Insufficient documentation

## 2018-07-28 DIAGNOSIS — Z886 Allergy status to analgesic agent status: Secondary | ICD-10-CM | POA: Diagnosis not present

## 2018-07-28 DIAGNOSIS — Z79899 Other long term (current) drug therapy: Secondary | ICD-10-CM | POA: Insufficient documentation

## 2018-07-28 DIAGNOSIS — G4733 Obstructive sleep apnea (adult) (pediatric): Secondary | ICD-10-CM | POA: Diagnosis not present

## 2018-07-28 DIAGNOSIS — Z955 Presence of coronary angioplasty implant and graft: Secondary | ICD-10-CM | POA: Diagnosis not present

## 2018-07-28 DIAGNOSIS — N183 Chronic kidney disease, stage 3 (moderate): Secondary | ICD-10-CM | POA: Diagnosis not present

## 2018-07-28 DIAGNOSIS — Z885 Allergy status to narcotic agent status: Secondary | ICD-10-CM | POA: Insufficient documentation

## 2018-07-28 DIAGNOSIS — Z91013 Allergy to seafood: Secondary | ICD-10-CM | POA: Insufficient documentation

## 2018-07-28 LAB — BASIC METABOLIC PANEL
Anion gap: 9 (ref 5–15)
BUN: 26 mg/dL — ABNORMAL HIGH (ref 6–20)
CO2: 27 mmol/L (ref 22–32)
Calcium: 8.8 mg/dL — ABNORMAL LOW (ref 8.9–10.3)
Chloride: 101 mmol/L (ref 98–111)
Creatinine, Ser: 1.23 mg/dL — ABNORMAL HIGH (ref 0.44–1.00)
GFR calc Af Amer: 57 mL/min — ABNORMAL LOW (ref >60)
GFR calc non Af Amer: 49 mL/min — ABNORMAL LOW (ref >60)
Glucose, Bld: 194 mg/dL — ABNORMAL HIGH (ref 70–99)
Potassium: 3.9 mmol/L (ref 3.5–5.1)
Sodium: 137 mmol/L (ref 135–145)

## 2018-07-28 NOTE — Progress Notes (Signed)
ADVANCED HF CLINIC  NOTE  Referring Physician: Dr Bettina Gavia  Primary Care: Dr Tobie Poet in [place] Primary Cardiologist: Dr Bettina Gavia  Pulmonary: Dr Alcide Clever HF: Dr [name]  HPI: Dana [name] is a 58 y.o. female with a history of obesity, diastolic heart failure, DMI, neuropathy, HTN, hyperlipidemia, CAD (most recent [facility] 08/2016 DES RCA and stent LAD total of 4 stents), anemia, OSA --> started CPAP 12/2016, PAF S/P ablation, PAD S/P L great toe and 4th toe amputation.   Admitted 4/24 - 11/11/17 with chest pain while getting up out of her recliner.  EKG unremarkable. CXR with pulmonary edema. Troponin  0.03 - 0.05. Diuresed with IV lasix with resolution of symptoms. Echo with normal EF as below. Torsemide cut back to 60 mg daily on discharge.   On June 25th she was evaluated in the ED for syncope. She was in her usual state with no complaints. She was standing at the sink and had high pitch sound in her ears. Her husband found her on kitchen floor. She was evaluated in ED and later sent home.   Todays she returns for HF follow up. Since the last visit she was admitted to [facility]  on 12/23 with pneumonia,  A/C systolic hf, and NSTEMI. Troponin > 45. She was going to have LHC but due to elevated  Creatinine the cath was not pursued. Overall feeling ok. Yesterday she had dizziness but today that has resolved. SOB with steps.  Denies PND/Orthopnea. Denies chest pain. Appetite ok. No fever or chills. Weight at home 250  Pounds. Riding 1 mile on stationary bike about 5 days a week.  Taking all medications.   Zio patch 02/2018 with no arrhythmias, only occasional PACs/PVCs  Cardiomems numbers reviewed: Reading 8 mm hg from today    - Echo 11/07/17 LVEF 65-70%, Grade 2 DD. Trivial MR, Mild LAE. - ECHO 08/2016 EF 55% RV was not clearly visualized  RHC 03/11/18 - Cardiomems device placed RA mean 10 RV 49/3 PA 50/19 (31) PCWP 16 Cardiac Output (Fick) 8.3 Cardiac Index (Fick)  3.88  Studies RHC/LHC 01/07/2017  Stents patent RA mean 7 RV 31/6 PA 31/10, mean 20 PCWP mean 7 LV 142/12 AO 134/64 Oxygen saturations: PA 66% AO 99% Cardiac Output (Fick) 7.31  Cardiac Index (Fick) 3.37 Left Main  No significant disease.  Left Anterior Descending  30% ostial stenosis. 30-40% proximal stenosis. Patent mid LAD stent. 40-50% mid LAD stenosis distal to stent.  Left Circumflex  Large OM1. 30% mid LCx stenosis after OM1. Moderate OM2 with 30-40% proximal stenosis. 30% distal LCx stenosis.  Right Coronary Artery  Serial 30-40% stenoses in the proximal RCA. Long stented segment in the PDA was patent.    Cardiac cath [facility] 08/30/16:  2. Severe stenosis of the distal RCA extending into the rPDA. 3. Patent stent in the LAD. 4. Moderate macrovascular disease elsewhere. 5. Diffuse microvascular disease seen. 6. Mild pulmonary hypertension. 7. Normal LVEDP. 8. Normal cardiac output. Interventional Summary Successful PCI / Xience Drug Eluting Stent (2.75/23 mm) of the distal Right RA 7 PCW 14 CO/CO 6.3/3  CTA 08/2016 --. No PE    Review of systems complete and found to be negative unless listed in HPI.    Past Medical History:  Diagnosis Date  . Anginal pain ([place])   . CAD (coronary artery disease)    08/2016 DES to RCA, also with history of LAD stenting.   . Diabetes ([place])   . Diastolic CHF ([place])   .  Hyperlipidemia   . Hypertension   . OSA (obstructive sleep apnea)     Current Outpatient Medications  Medication Sig Dispense Refill  . albuterol (ACCUNEB) 0.63 MG/3ML nebulizer solution Take 1 ampule by nebulization every 6 (six) hours as needed for wheezing or shortness of breath.     Marland Kitchen albuterol (PROVENTIL HFA;VENTOLIN HFA) 108 (90 Base) MCG/ACT inhaler Inhale 2 puffs into the lungs every 6 (six) hours as needed for wheezing or shortness of breath.     Marland Kitchen aspirin EC 81 MG tablet Take 1 tablet (81 mg total) by mouth daily. 30 tablet 11  . carvedilol (COREG)  6.25 MG tablet Take 1.5 tablets (9.375 mg total) by mouth 2 (two) times daily. 90 tablet 3  . clopidogrel (PLAVIX) 75 MG tablet Take 75 mg by mouth at bedtime.     Marland Kitchen EPINEPHrine 0.3 mg/0.3 mL IJ SOAJ injection Use as directed for life-threatening allergic reaction. 4 Device 3  . eszopiclone (LUNESTA) 2 MG TABS tablet Take 3 mg by mouth at bedtime.     . ferrous sulfate 325 (65 FE) MG tablet Take 325 mg by mouth 2 (two) times daily with a meal.    . hydrALAZINE (APRESOLINE) 50 MG tablet Take 1.5 tablets (75 mg total) by mouth 3 (three) times daily. 150 tablet 3  . HYDROcodone-acetaminophen (NORCO) 7.5-325 MG tablet Take 1 tablet by mouth 3 (three) times daily as needed for moderate pain.     Marland Kitchen insulin NPH Human (HUMULIN N,NOVOLIN N) 100 UNIT/ML injection Inject 30 Units into the skin 2 (two) times daily before a meal.     . insulin regular (NOVOLIN R,HUMULIN R) 100 units/mL injection Inject 7-12 Units into the skin 3 (three) times daily before meals. Per sliding scale    . isosorbide mononitrate (IMDUR) 30 MG 24 hr tablet Take 0.5 tablets (15 mg total) by mouth daily. 15 tablet 6  . latanoprost (XALATAN) 0.005 % ophthalmic solution Place 1 drop into both eyes at bedtime.    . magnesium oxide (MAG-OX) 400 MG tablet Take 2 tablets (800 mg total) by mouth daily. Take 2 (400 mg) daily=800 mg 60 tablet 1  . metolazone (ZAROXOLYN) 2.5 MG tablet TAKE 1 TABLET BY MOUTH ONCE A WEEK. TAKE EVERY WEDNESDAY (Patient taking differently: Take 2.5 mg by mouth See admin instructions. Take 2.5 mg by mouth once weekly on Wednesday. May take additional 2.5 mg dose on Saturday if needed for fluid) 10 tablet 4  . Omega-3 Fatty Acids (FISH OIL PO) Take 1 capsule by mouth daily.    . OXYGEN Inhale 2 L into the lungs at bedtime.    . pravastatin (PRAVACHOL) 40 MG tablet Take 80 mg by mouth at bedtime.     . Probiotic CAPS Take 1 capsule by mouth daily.    . promethazine (PHENERGAN) 25 MG tablet Take 25 mg by mouth every 6  (six) hours as needed for nausea or vomiting.    Marland Kitchen spironolactone (ALDACTONE) 25 MG tablet Take 1 tablet (25 mg total) by mouth daily. 30 tablet 3  . torsemide (DEMADEX) 20 MG tablet Take 40 mg three times a day 180 tablet 3  . Vitamin D, Ergocalciferol, (DRISDOL) 50000 units CAPS capsule Take 50,000 Units by mouth every Wednesday.     . famotidine (PEPCID) 20 MG tablet Take 1 tablet (20 mg total) by mouth daily. (Patient not taking: Reported on 07/28/2018) 30 tablet 5  . nitroGLYCERIN (NITROSTAT) 0.4 MG SL tablet Place 0.4 mg under the tongue  every 5 (five) minutes as needed for chest pain.      No current facility-administered medications for this encounter.     Allergies  Allergen Reactions  . Naproxen Hives and Rash  . Celecoxib Other (See Comments)    Stomach pain  . Aspirin Other (See Comments)    CAN NOT TAKE DUE TO ULCERS  . Glucosamine Hives, Swelling and Other (See Comments)    Angioedema   . Metoclopramide Other (See Comments)    Facial twitching and stuttering  . Metolazone Other (See Comments)    Acute renal failure  . Shellfish-Derived Products Hives, Swelling and Other (See Comments)    Angioedema   . Tramadol Nausea And Vomiting           Social History   Socioeconomic History  . Marital status: Married    Spouse name: Not on file  . Number of children: Not on file  . Years of education: Not on file  . Highest education level: Not on file  Occupational History  . Not on file  Social Needs  . Financial resource strain: Not on file  . Food insecurity:    Worry: Not on file    Inability: Not on file  . Transportation needs:    Medical: Not on file    Non-medical: Not on file  Tobacco Use  . Smoking status: Former Research scientist (life sciences)  . Smokeless tobacco: Never Used  Substance and Sexual Activity  . Alcohol use: Not Currently  . Drug use: Not Currently  . Sexual activity: Not on file  Lifestyle  . Physical activity:    Days per week: Not on file    Minutes  per session: Not on file  . Stress: Not on file  Relationships  . Social connections:    Talks on phone: Not on file    Gets together: Not on file    Attends religious service: Not on file    Active member of club or organization: Not on file    Attends meetings of clubs or organizations: Not on file    Relationship status: Not on file  . Intimate partner violence:    Fear of current or ex partner: Not on file    Emotionally abused: Not on file    Physically abused: Not on file    Forced sexual activity: Not on file  Other Topics Concern  . Not on file  Social History Narrative  . Not on file    FamHx:  Denies h/o familial CM or SCD. Multiple family members with CAD.   Vitals:   07/28/18 0927  BP: (!) 156/74  Pulse: 91  SpO2: 99%  Weight: 112 kg (247 lb)   Wt Readings from Last 3 Encounters:  07/28/18 112 kg (247 lb)  06/24/18 115.6 kg (254 lb 12.8 oz)  05/28/18 114.9 kg (253 lb 3.2 oz)    PHYSICAL EXAM: General:  Well appearing. No resp difficulty HEENT: normal Neck: supple. no JVD. Carotids 2+ bilat; no bruits. No lymphadenopathy or thryomegaly appreciated. Cor: PMI nondisplaced. Regular rate & rhythm. No rubs, gallops or murmurs. Lungs: clear Abdomen: obese, soft, nontender, nondistended. No hepatosplenomegaly. No bruits or masses. Good bowel sounds. Extremities: no cyanosis, clubbing, rash, edema Neuro: alert & orientedx3, cranial nerves grossly intact. moves all 4 extremities w/o difficulty. Affect pleasant   ASSESSMENT & PLAN:  1. Chronic Diastolic Heart Failure - Echo 11/07/17 LVEF 65-70%, Grade 2 DD - s/p Cardiomems device 03/11/18. Cardiomems reading 8 mm HG today  -  Continue torsemide 40 mg three times a day + metolazone 2.5 mg on Wednesday.  - Continue coreg to 9.375 mg twice a day.   - Continue  hydralazine to 50 mg TID - Continue imdur 15 mg daily.  - Continue spiro 25 mg daily.  2. Obesity - Body mass index is 41.74 kg/m.  - Discussed weight  loss and portion control.  3. OSA - Continue CPAP. No change.   4. CAD - 4 stents. 08/2016  stent RCA.  - 2018 LHC nonobstructive.  - There is some concern she may need a cath but we will review recent hospitalization at [place] [facility].  - Continue plavix, bb, and statin.  5. PAF - S/P ablation 2016. - Stressed compliance with CPAP - Holter monitor 06/2017 with occasional PACs and SVT.  - BB as above.   7. HTN:  -BP at home has been stable. Continue current regimen.  8. CKD III - Baseline creatinine appears to be 1.3 - 1.5 -Check BMET  9. Syncope - Unexplained syncope in June 2019. Does not sound like vasovagal or orthostasis.  - She understands she cant drive for 6 months (through December 2019) - Zio patch 02/2018 with no arrhythmias, only occasional PACs/PVCs. - No episodes recently.  10. Obesity  Body mass index is 41.74 kg/m. Needs to lose weight. Discussed portion control.   We have requested d/c summary from recent hospitalization in [facility]. May need to set up [facility], [facility], [facility] after Dr [name] reviews d/c summary.     Dana Grinder, NP  9:46 AM   Patient seen and examined with Dana Grinder, NP. We discussed all aspects of the encounter. I agree with the assessment and plan as stated above.   She has been doing well from a HF standpoint with Cardiomems. Volume status looks good. Recently admitted to [facility] for CP and troponin apparently was elevated and was told she needed a cath but creatinine was elevated so it was deferred. Has not had recurrent CP since.   Previous cath films reviewed and she had stable moderate CAD. Will get d/c summary from [facility] and if troponin significantly elevated will proceed with cath.   Dana Bickers, MD  3:01 PM

## 2018-07-28 NOTE — Patient Instructions (Addendum)
Labs done today  Please keep your up coming appointment on Thursday February 13th at 9:30am with the Advanced Practice Providers

## 2018-08-07 DIAGNOSIS — E1042 Type 1 diabetes mellitus with diabetic polyneuropathy: Secondary | ICD-10-CM | POA: Diagnosis not present

## 2018-08-15 ENCOUNTER — Telehealth (HOSPITAL_COMMUNITY): Payer: Self-pay | Admitting: *Deleted

## 2018-08-15 NOTE — Telephone Encounter (Signed)
Per Amy patient advised she does not need a cath at this time. Troponin levels were reviewed by Dr.Bensimhon. Pt aware and agreeable with plan.

## 2018-08-15 NOTE — Telephone Encounter (Signed)
Pt records from [facility] received and given to Dr.Bensimhon to review.

## 2018-08-18 DIAGNOSIS — E669 Obesity, unspecified: Secondary | ICD-10-CM | POA: Diagnosis not present

## 2018-08-18 DIAGNOSIS — R809 Proteinuria, unspecified: Secondary | ICD-10-CM | POA: Diagnosis not present

## 2018-08-18 DIAGNOSIS — I10 Essential (primary) hypertension: Secondary | ICD-10-CM | POA: Diagnosis not present

## 2018-08-18 DIAGNOSIS — D649 Anemia, unspecified: Secondary | ICD-10-CM | POA: Diagnosis not present

## 2018-08-18 DIAGNOSIS — R309 Painful micturition, unspecified: Secondary | ICD-10-CM | POA: Diagnosis not present

## 2018-08-18 DIAGNOSIS — N182 Chronic kidney disease, stage 2 (mild): Secondary | ICD-10-CM | POA: Diagnosis not present

## 2018-08-18 DIAGNOSIS — I509 Heart failure, unspecified: Secondary | ICD-10-CM | POA: Diagnosis not present

## 2018-08-18 DIAGNOSIS — E1169 Type 2 diabetes mellitus with other specified complication: Secondary | ICD-10-CM | POA: Diagnosis not present

## 2018-08-28 ENCOUNTER — Encounter (HOSPITAL_COMMUNITY): Payer: Medicare Other

## 2018-09-02 DIAGNOSIS — I119 Hypertensive heart disease without heart failure: Secondary | ICD-10-CM | POA: Diagnosis not present

## 2018-09-02 DIAGNOSIS — Z89412 Acquired absence of left great toe: Secondary | ICD-10-CM | POA: Diagnosis not present

## 2018-09-02 DIAGNOSIS — E782 Mixed hyperlipidemia: Secondary | ICD-10-CM | POA: Diagnosis not present

## 2018-09-02 DIAGNOSIS — Z89422 Acquired absence of other left toe(s): Secondary | ICD-10-CM | POA: Diagnosis not present

## 2018-09-02 DIAGNOSIS — R0789 Other chest pain: Secondary | ICD-10-CM | POA: Diagnosis not present

## 2018-09-02 DIAGNOSIS — F5101 Primary insomnia: Secondary | ICD-10-CM | POA: Diagnosis not present

## 2018-09-02 DIAGNOSIS — Z6841 Body Mass Index (BMI) 40.0 and over, adult: Secondary | ICD-10-CM | POA: Diagnosis not present

## 2018-09-02 DIAGNOSIS — E1142 Type 2 diabetes mellitus with diabetic polyneuropathy: Secondary | ICD-10-CM | POA: Diagnosis not present

## 2018-09-02 DIAGNOSIS — R040 Epistaxis: Secondary | ICD-10-CM | POA: Diagnosis not present

## 2018-09-02 DIAGNOSIS — I5022 Chronic systolic (congestive) heart failure: Secondary | ICD-10-CM | POA: Diagnosis not present

## 2018-09-04 DIAGNOSIS — E875 Hyperkalemia: Secondary | ICD-10-CM | POA: Diagnosis not present

## 2018-09-04 DIAGNOSIS — E782 Mixed hyperlipidemia: Secondary | ICD-10-CM | POA: Diagnosis not present

## 2018-09-04 DIAGNOSIS — Z0000 Encounter for general adult medical examination without abnormal findings: Secondary | ICD-10-CM | POA: Diagnosis not present

## 2018-09-04 DIAGNOSIS — Z6841 Body Mass Index (BMI) 40.0 and over, adult: Secondary | ICD-10-CM | POA: Diagnosis not present

## 2018-09-11 ENCOUNTER — Encounter (HOSPITAL_COMMUNITY): Payer: Medicare HMO

## 2018-09-12 ENCOUNTER — Encounter (HOSPITAL_COMMUNITY): Payer: Self-pay

## 2018-09-12 ENCOUNTER — Other Ambulatory Visit: Payer: Self-pay

## 2018-09-12 ENCOUNTER — Ambulatory Visit (HOSPITAL_COMMUNITY)
Admission: RE | Admit: 2018-09-12 | Discharge: 2018-09-12 | Disposition: A | Discharge status: Home / Self Care | Payer: Medicare HMO | Source: Ambulatory Visit | Attending: Cardiology | Admitting: Cardiology

## 2018-09-12 VITALS — BP 152/64 | HR 80 | Wt 253.2 lb

## 2018-09-12 DIAGNOSIS — Z87891 Personal history of nicotine dependence: Secondary | ICD-10-CM | POA: Diagnosis not present

## 2018-09-12 DIAGNOSIS — Z955 Presence of coronary angioplasty implant and graft: Secondary | ICD-10-CM | POA: Insufficient documentation

## 2018-09-12 DIAGNOSIS — D649 Anemia, unspecified: Secondary | ICD-10-CM | POA: Insufficient documentation

## 2018-09-12 DIAGNOSIS — I471 Supraventricular tachycardia: Secondary | ICD-10-CM | POA: Insufficient documentation

## 2018-09-12 DIAGNOSIS — G629 Polyneuropathy, unspecified: Secondary | ICD-10-CM | POA: Diagnosis not present

## 2018-09-12 DIAGNOSIS — Z6841 Body Mass Index (BMI) 40.0 and over, adult: Secondary | ICD-10-CM | POA: Insufficient documentation

## 2018-09-12 DIAGNOSIS — I130 Hypertensive heart and chronic kidney disease with heart failure and stage 1 through stage 4 chronic kidney disease, or unspecified chronic kidney disease: Secondary | ICD-10-CM | POA: Diagnosis not present

## 2018-09-12 DIAGNOSIS — E669 Obesity, unspecified: Secondary | ICD-10-CM | POA: Insufficient documentation

## 2018-09-12 DIAGNOSIS — E785 Hyperlipidemia, unspecified: Secondary | ICD-10-CM | POA: Insufficient documentation

## 2018-09-12 DIAGNOSIS — Z79899 Other long term (current) drug therapy: Secondary | ICD-10-CM | POA: Insufficient documentation

## 2018-09-12 DIAGNOSIS — Z7902 Long term (current) use of antithrombotics/antiplatelets: Secondary | ICD-10-CM | POA: Insufficient documentation

## 2018-09-12 DIAGNOSIS — Z7982 Long term (current) use of aspirin: Secondary | ICD-10-CM | POA: Insufficient documentation

## 2018-09-12 DIAGNOSIS — I5030 Unspecified diastolic (congestive) heart failure: Secondary | ICD-10-CM

## 2018-09-12 DIAGNOSIS — Z794 Long term (current) use of insulin: Secondary | ICD-10-CM | POA: Diagnosis not present

## 2018-09-12 DIAGNOSIS — I480 Paroxysmal atrial fibrillation: Secondary | ICD-10-CM | POA: Diagnosis not present

## 2018-09-12 DIAGNOSIS — N183 Chronic kidney disease, stage 3 unspecified: Secondary | ICD-10-CM

## 2018-09-12 DIAGNOSIS — G4733 Obstructive sleep apnea (adult) (pediatric): Secondary | ICD-10-CM | POA: Insufficient documentation

## 2018-09-12 DIAGNOSIS — E1022 Type 1 diabetes mellitus with diabetic chronic kidney disease: Secondary | ICD-10-CM | POA: Insufficient documentation

## 2018-09-12 DIAGNOSIS — I5032 Chronic diastolic (congestive) heart failure: Secondary | ICD-10-CM | POA: Insufficient documentation

## 2018-09-12 DIAGNOSIS — I2510 Atherosclerotic heart disease of native coronary artery without angina pectoris: Secondary | ICD-10-CM | POA: Diagnosis not present

## 2018-09-12 LAB — BASIC METABOLIC PANEL
Anion gap: 10 (ref 5–15)
BUN: 28 mg/dL — ABNORMAL HIGH (ref 6–20)
CO2: 26 mmol/L (ref 22–32)
Calcium: 8.7 mg/dL — ABNORMAL LOW (ref 8.9–10.3)
Chloride: 101 mmol/L (ref 98–111)
Creatinine, Ser: 1.23 mg/dL — ABNORMAL HIGH (ref 0.44–1.00)
GFR calc Af Amer: 57 mL/min — ABNORMAL LOW (ref >60)
GFR calc non Af Amer: 49 mL/min — ABNORMAL LOW (ref >60)
Glucose, Bld: 101 mg/dL — ABNORMAL HIGH (ref 70–99)
Potassium: 4.1 mmol/L (ref 3.5–5.1)
Sodium: 137 mmol/L (ref 135–145)

## 2018-09-12 NOTE — Patient Instructions (Signed)
Labs were done today. We will call you with any ABNORMAL results. No news is good news!  Your physician recommends that you schedule a follow-up appointment in: 3 months

## 2018-09-12 NOTE — Progress Notes (Signed)
ADVANCED HF CLINIC  NOTE  Referring Physician: Dr Bettina Gavia  Primary Care: Dr Tobie Poet in [place] Primary Cardiologist: Dr Bettina Gavia  Pulmonary: Dr Alcide Clever HF: Dr [name]  HPI: Dana [name] is a 58 y.o. female with a history of obesity, diastolic heart failure, DMI, neuropathy, HTN, hyperlipidemia, CAD (most recent [facility] 08/2016 DES RCA and stent LAD total of 4 stents), anemia, OSA --> started CPAP 12/2016, PAF S/P ablation, PAD S/P L great toe and 4th toe amputation.   Admitted 4/24 - 11/11/17 with chest pain while getting up out of her recliner.  EKG unremarkable. CXR with pulmonary edema. Troponin  0.03 - 0.05. Diuresed with IV lasix with resolution of symptoms. Echo with normal EF as below. Torsemide cut back to 60 mg daily on discharge.   On June 25th she was evaluated in the ED for syncope. She was in her usual state with no complaints. She was standing at the sink and had high pitch sound in her ears. Her husband found her on kitchen floor. She was evaluated in ED and later sent home.   Zio patch 02/2018 with no arrhythmias, only occasional PACs/PVCs  Admitted in December 2019 to [facility] with increased sob. Diuresed with IV lasix and sent home.  Today she for HF follow up. Overall feeling fine. Mild dyspnea with steps. Denies PND/Orthopnea. Able to ride 3 days a week on stationary bike 3 miles at a time. SBP at home 120-130. Continue CPAP. No chest pain. Appetite ok. No fever or chills. Weight at home 250 pounds. Taking all medications.  Zio patch 02/2018 with no arrhythmias, only occasional PACs/PVCs  Cardiomems numbers reviewed: Reading 10 mm hg from today    - Echo 11/07/17 LVEF 65-70%, Grade 2 DD. Trivial MR, Mild LAE. - ECHO 08/2016 EF 55% RV was not clearly visualized  RHC 03/11/18 - Cardiomems device placed RA mean 10 RV 49/3 PA 50/19 (31) PCWP 16 Cardiac Output (Fick) 8.3 Cardiac Index (Fick) 3.88  Studies RHC/LHC 01/07/2017  Stents patent RA mean 7 RV 31/6 PA 31/10, mean  20 PCWP mean 7 LV 142/12 AO 134/64 Oxygen saturations: PA 66% AO 99% Cardiac Output (Fick) 7.31  Cardiac Index (Fick) 3.37 Left Main  No significant disease.  Left Anterior Descending  30% ostial stenosis. 30-40% proximal stenosis. Patent mid LAD stent. 40-50% mid LAD stenosis distal to stent.  Left Circumflex  Large OM1. 30% mid LCx stenosis after OM1. Moderate OM2 with 30-40% proximal stenosis. 30% distal LCx stenosis.  Right Coronary Artery  Serial 30-40% stenoses in the proximal RCA. Long stented segment in the PDA was patent.    Cardiac cath [facility] 08/30/16:  2. Severe stenosis of the distal RCA extending into the rPDA. 3. Patent stent in the LAD. 4. Moderate macrovascular disease elsewhere. 5. Diffuse microvascular disease seen. 6. Mild pulmonary hypertension. 7. Normal LVEDP. 8. Normal cardiac output. Interventional Summary Successful PCI / Xience Drug Eluting Stent (2.75/23 mm) of the distal Right RA 7 PCW 14 CO/CO 6.3/3  CTA 08/2016 --. No PE    Review of systems complete and found to be negative unless listed in HPI.    Past Medical History:  Diagnosis Date  . Anginal pain ([place])   . CAD (coronary artery disease)    08/2016 DES to RCA, also with history of LAD stenting.   . Diabetes ([place])   . Diastolic CHF ([place])   . Hyperlipidemia   . Hypertension   . OSA (obstructive sleep apnea)  Current Outpatient Medications  Medication Sig Dispense Refill  . albuterol (ACCUNEB) 0.63 MG/3ML nebulizer solution Take 1 ampule by nebulization every 6 (six) hours as needed for wheezing or shortness of breath.     Marland Kitchen albuterol (PROVENTIL HFA;VENTOLIN HFA) 108 (90 Base) MCG/ACT inhaler Inhale 2 puffs into the lungs every 6 (six) hours as needed for wheezing or shortness of breath.     Marland Kitchen aspirin EC 81 MG tablet Take 1 tablet (81 mg total) by mouth daily. 30 tablet 11  . carvedilol (COREG) 6.25 MG tablet Take 1.5 tablets (9.375 mg total) by mouth 2 (two) times daily. 90  tablet 3  . clopidogrel (PLAVIX) 75 MG tablet Take 75 mg by mouth at bedtime.     Marland Kitchen EPINEPHrine 0.3 mg/0.3 mL IJ SOAJ injection Use as directed for life-threatening allergic reaction. 4 Device 3  . Evolocumab (REPATHA [place]) Inject into the skin.    . famotidine (PEPCID) 20 MG tablet Take 1 tablet (20 mg total) by mouth daily. 30 tablet 5  . ferrous sulfate 325 (65 FE) MG tablet Take 325 mg by mouth 2 (two) times daily with a meal.    . hydrALAZINE (APRESOLINE) 50 MG tablet Take 1.5 tablets (75 mg total) by mouth 3 (three) times daily. 150 tablet 3  . HYDROcodone-acetaminophen (NORCO) 7.5-325 MG tablet Take 1 tablet by mouth 3 (three) times daily as needed for moderate pain.     Marland Kitchen insulin NPH Human (HUMULIN N,NOVOLIN N) 100 UNIT/ML injection Inject 30 Units into the skin 2 (two) times daily before a meal.     . insulin regular (NOVOLIN R,HUMULIN R) 100 units/mL injection Inject 7-12 Units into the skin 3 (three) times daily before meals. Per sliding scale    . isosorbide mononitrate (IMDUR) 30 MG 24 hr tablet Take 0.5 tablets (15 mg total) by mouth daily. 15 tablet 6  . latanoprost (XALATAN) 0.005 % ophthalmic solution Place 1 drop into both eyes at bedtime.    . magnesium oxide (MAG-OX) 400 MG tablet Take 2 tablets (800 mg total) by mouth daily. Take 2 (400 mg) daily=800 mg 60 tablet 1  . metolazone (ZAROXOLYN) 2.5 MG tablet TAKE 1 TABLET BY MOUTH ONCE A WEEK. TAKE EVERY WEDNESDAY (Patient taking differently: Take 2.5 mg by mouth See admin instructions. Take 2.5 mg by mouth once weekly on Wednesday. May take additional 2.5 mg dose on Saturday if needed for fluid) 10 tablet 4  . nitroGLYCERIN (NITROSTAT) 0.4 MG SL tablet Place 0.4 mg under the tongue every 5 (five) minutes as needed for chest pain.     . Omega-3 Fatty Acids (FISH OIL PO) Take 1 capsule by mouth daily.    . OXYGEN Inhale 2 L into the lungs at bedtime.    . pravastatin (PRAVACHOL) 40 MG tablet Take 80 mg by mouth at bedtime.     .  Probiotic CAPS Take 1 capsule by mouth daily.    . promethazine (PHENERGAN) 25 MG tablet Take 25 mg by mouth every 6 (six) hours as needed for nausea or vomiting.    . torsemide (DEMADEX) 20 MG tablet Take 40 mg three times a day 180 tablet 3  . Vitamin D, Ergocalciferol, (DRISDOL) 50000 units CAPS capsule Take 50,000 Units by mouth every Wednesday.     Marland Kitchen spironolactone (ALDACTONE) 25 MG tablet Take 1 tablet (25 mg total) by mouth daily. 30 tablet 3   No current facility-administered medications for this encounter.     Allergies  Allergen Reactions  .  Naproxen Hives and Rash  . Celecoxib Other (See Comments)    Stomach pain  . Aspirin Other (See Comments)    CAN NOT TAKE DUE TO ULCERS  . Glucosamine Hives, Swelling and Other (See Comments)    Angioedema   . Metoclopramide Other (See Comments)    Facial twitching and stuttering  . Metolazone Other (See Comments)    Acute renal failure  . Shellfish-Derived Products Hives, Swelling and Other (See Comments)    Angioedema   . Tramadol Nausea And Vomiting           Social History   Socioeconomic History  . Marital status: Married    Spouse name: Not on file  . Number of children: Not on file  . Years of education: Not on file  . Highest education level: Not on file  Occupational History  . Not on file  Social Needs  . Financial resource strain: Not on file  . Food insecurity:    Worry: Not on file    Inability: Not on file  . Transportation needs:    Medical: Not on file    Non-medical: Not on file  Tobacco Use  . Smoking status: Former Research scientist (life sciences)  . Smokeless tobacco: Never Used  Substance and Sexual Activity  . Alcohol use: Not Currently  . Drug use: Not Currently  . Sexual activity: Not on file  Lifestyle  . Physical activity:    Days per week: Not on file    Minutes per session: Not on file  . Stress: Not on file  Relationships  . Social connections:    Talks on phone: Not on file    Gets together: Not on  file    Attends religious service: Not on file    Active member of club or organization: Not on file    Attends meetings of clubs or organizations: Not on file    Relationship status: Not on file  . Intimate partner violence:    Fear of current or ex partner: Not on file    Emotionally abused: Not on file    Physically abused: Not on file    Forced sexual activity: Not on file  Other Topics Concern  . Not on file  Social History Narrative  . Not on file    FamHx:  Denies h/o familial CM or SCD. Multiple family members with CAD.   Vitals:   09/12/18 1009  BP: (!) 152/64  Pulse: 80  SpO2: 98%  Weight: 114.9 kg (253 lb 3.2 oz)   Wt Readings from Last 3 Encounters:  09/12/18 114.9 kg (253 lb 3.2 oz)  07/28/18 112 kg (247 lb)  06/24/18 115.6 kg (254 lb 12.8 oz)    PHYSICAL EXAM: General:  Well appearing. No resp difficulty HEENT: normal Neck: supple. JVP 6-7  Carotids 2+ bilat; no bruits. No lymphadenopathy or thryomegaly appreciated. Cor: PMI nondisplaced. Regular rate & rhythm. No rubs, gallops or murmurs. Lungs: clear Abdomen: obese, soft, nontender, nondistended. No hepatosplenomegaly. No bruits or masses. Good bowel sounds. Extremities: no cyanosis, clubbing, rash, R and LLE trace edema Neuro: alert & orientedx3, cranial nerves grossly intact. moves all 4 extremities w/o difficulty. Affect pleasant   ASSESSMENT & PLAN:  1. Chronic Diastolic Heart Failure - Echo 5/19 LVEF 65-70%, Grade 2 DD - s/p Cardiomems device 03/11/18. Cardiomems reading 10 mm HG today  - Volume status stable today.  - Continue torsemide 40 mg three times a day + metolazone 2.5 mg on Wednesday.  -  Continue coreg to 9.375 mg twice a day.   - Continue  hydralazine to 50 mg TID - Continue imdur 15 mg daily.  - Continue spiro 25 mg daily.  2. Obesity - Body mass index is 42.79 kg/m.  - Discussed portion control.  3. OSA - Continue CPAP. No change.   4. CAD - 4 stents. 08/2016  stent RCA.  -  2018 LHC nonobstructive.  - There is some concern she may need a cath but we will review recent hospitalization at [facility].  - Continue plavix, bb, and statin.  5. PAF - S/P ablation 2016. - Stressed compliance with CPAP - Holter monitor 06/2017 with occasional PACs and SVT.  - BB as above.   7. HTN:  -BP at home has been stable. Continue current regimen.  8. CKD III - Baseline creatinine appears to be 1.3 - 1.5 - Check BMET today.   Follow up in 3 months.  Greater than 50% of the (total minutes 25) visit spent in counseling/coordination of care regarding low salt food choices and limiting fluid intake.   Darrick Grinder, NP  10:43 AM

## 2018-09-16 ENCOUNTER — Ambulatory Visit: Payer: Medicare Other | Admitting: Allergy

## 2018-09-24 DIAGNOSIS — G4733 Obstructive sleep apnea (adult) (pediatric): Secondary | ICD-10-CM | POA: Diagnosis not present

## 2018-09-24 DIAGNOSIS — J301 Allergic rhinitis due to pollen: Secondary | ICD-10-CM | POA: Diagnosis not present

## 2018-09-24 DIAGNOSIS — J4520 Mild intermittent asthma, uncomplicated: Secondary | ICD-10-CM | POA: Diagnosis not present

## 2018-10-21 DIAGNOSIS — N3000 Acute cystitis without hematuria: Secondary | ICD-10-CM | POA: Diagnosis not present

## 2018-11-10 ENCOUNTER — Telehealth (HOSPITAL_COMMUNITY): Payer: Self-pay | Admitting: Adult Health

## 2018-11-10 NOTE — Telephone Encounter (Signed)
Cardiomems reading elevated over the last 3 days.    Called and instructed to take an extra metolazone today.   Mrs Fretz verbalized understanding.   Travion Ke NP-C  11:30 AM

## 2018-11-28 DIAGNOSIS — E1040 Type 1 diabetes mellitus with diabetic neuropathy, unspecified: Secondary | ICD-10-CM | POA: Diagnosis not present

## 2018-11-28 DIAGNOSIS — E785 Hyperlipidemia, unspecified: Secondary | ICD-10-CM | POA: Diagnosis not present

## 2018-11-28 DIAGNOSIS — E1065 Type 1 diabetes mellitus with hyperglycemia: Secondary | ICD-10-CM | POA: Diagnosis not present

## 2018-11-28 DIAGNOSIS — Z794 Long term (current) use of insulin: Secondary | ICD-10-CM | POA: Diagnosis not present

## 2018-11-28 DIAGNOSIS — I10 Essential (primary) hypertension: Secondary | ICD-10-CM | POA: Diagnosis not present

## 2018-12-09 ENCOUNTER — Telehealth (HOSPITAL_COMMUNITY): Payer: Self-pay | Admitting: Adult Health

## 2018-12-09 DIAGNOSIS — Z6841 Body Mass Index (BMI) 40.0 and over, adult: Secondary | ICD-10-CM | POA: Diagnosis not present

## 2018-12-09 DIAGNOSIS — E1142 Type 2 diabetes mellitus with diabetic polyneuropathy: Secondary | ICD-10-CM | POA: Diagnosis not present

## 2018-12-09 DIAGNOSIS — F5101 Primary insomnia: Secondary | ICD-10-CM | POA: Diagnosis not present

## 2018-12-09 DIAGNOSIS — I119 Hypertensive heart disease without heart failure: Secondary | ICD-10-CM | POA: Diagnosis not present

## 2018-12-09 DIAGNOSIS — I5022 Chronic systolic (congestive) heart failure: Secondary | ICD-10-CM | POA: Diagnosis not present

## 2018-12-09 DIAGNOSIS — E782 Mixed hyperlipidemia: Secondary | ICD-10-CM | POA: Diagnosis not present

## 2018-12-09 NOTE — Telephone Encounter (Signed)
  Called Dana [name]---> Cardiomems up to 14 mm hg.   She reports 2 pound weight gain with some increased shortness of breath.   Instructed to take metolazone today. Dana [name] verbalized understanding.   She has follow up next week and we will check BMET at that time.   Kavontae Pritchard NP-C  2:48 PM

## 2018-12-15 ENCOUNTER — Ambulatory Visit (HOSPITAL_COMMUNITY)
Admission: RE | Admit: 2018-12-15 | Discharge: 2018-12-15 | Disposition: A | Discharge status: Home / Self Care | Payer: Medicare HMO | Source: Ambulatory Visit | Attending: Internal Medicine | Admitting: Internal Medicine

## 2018-12-15 ENCOUNTER — Encounter (HOSPITAL_COMMUNITY): Payer: Self-pay

## 2018-12-15 ENCOUNTER — Other Ambulatory Visit: Payer: Self-pay

## 2018-12-15 VITALS — BP 150/74 | HR 75 | Wt 261.2 lb

## 2018-12-15 DIAGNOSIS — Z7982 Long term (current) use of aspirin: Secondary | ICD-10-CM | POA: Diagnosis not present

## 2018-12-15 DIAGNOSIS — Z7902 Long term (current) use of antithrombotics/antiplatelets: Secondary | ICD-10-CM | POA: Insufficient documentation

## 2018-12-15 DIAGNOSIS — E785 Hyperlipidemia, unspecified: Secondary | ICD-10-CM | POA: Insufficient documentation

## 2018-12-15 DIAGNOSIS — E669 Obesity, unspecified: Secondary | ICD-10-CM | POA: Insufficient documentation

## 2018-12-15 DIAGNOSIS — I480 Paroxysmal atrial fibrillation: Secondary | ICD-10-CM | POA: Diagnosis not present

## 2018-12-15 DIAGNOSIS — Z885 Allergy status to narcotic agent status: Secondary | ICD-10-CM | POA: Insufficient documentation

## 2018-12-15 DIAGNOSIS — I471 Supraventricular tachycardia: Secondary | ICD-10-CM | POA: Diagnosis not present

## 2018-12-15 DIAGNOSIS — Z888 Allergy status to other drugs, medicaments and biological substances status: Secondary | ICD-10-CM | POA: Diagnosis not present

## 2018-12-15 DIAGNOSIS — Z794 Long term (current) use of insulin: Secondary | ICD-10-CM | POA: Diagnosis not present

## 2018-12-15 DIAGNOSIS — Z882 Allergy status to sulfonamides status: Secondary | ICD-10-CM | POA: Diagnosis not present

## 2018-12-15 DIAGNOSIS — Z79899 Other long term (current) drug therapy: Secondary | ICD-10-CM | POA: Insufficient documentation

## 2018-12-15 DIAGNOSIS — Z87891 Personal history of nicotine dependence: Secondary | ICD-10-CM | POA: Insufficient documentation

## 2018-12-15 DIAGNOSIS — E1022 Type 1 diabetes mellitus with diabetic chronic kidney disease: Secondary | ICD-10-CM | POA: Insufficient documentation

## 2018-12-15 DIAGNOSIS — N183 Chronic kidney disease, stage 3 unspecified: Secondary | ICD-10-CM

## 2018-12-15 DIAGNOSIS — Z6841 Body Mass Index (BMI) 40.0 and over, adult: Secondary | ICD-10-CM | POA: Diagnosis not present

## 2018-12-15 DIAGNOSIS — Z8249 Family history of ischemic heart disease and other diseases of the circulatory system: Secondary | ICD-10-CM | POA: Insufficient documentation

## 2018-12-15 DIAGNOSIS — Z886 Allergy status to analgesic agent status: Secondary | ICD-10-CM | POA: Diagnosis not present

## 2018-12-15 DIAGNOSIS — Z91013 Allergy to seafood: Secondary | ICD-10-CM | POA: Diagnosis not present

## 2018-12-15 DIAGNOSIS — Z955 Presence of coronary angioplasty implant and graft: Secondary | ICD-10-CM | POA: Diagnosis not present

## 2018-12-15 DIAGNOSIS — I2510 Atherosclerotic heart disease of native coronary artery without angina pectoris: Secondary | ICD-10-CM | POA: Insufficient documentation

## 2018-12-15 DIAGNOSIS — G4733 Obstructive sleep apnea (adult) (pediatric): Secondary | ICD-10-CM | POA: Diagnosis not present

## 2018-12-15 DIAGNOSIS — I5032 Chronic diastolic (congestive) heart failure: Secondary | ICD-10-CM | POA: Insufficient documentation

## 2018-12-15 DIAGNOSIS — E1051 Type 1 diabetes mellitus with diabetic peripheral angiopathy without gangrene: Secondary | ICD-10-CM | POA: Insufficient documentation

## 2018-12-15 DIAGNOSIS — I130 Hypertensive heart and chronic kidney disease with heart failure and stage 1 through stage 4 chronic kidney disease, or unspecified chronic kidney disease: Secondary | ICD-10-CM | POA: Diagnosis not present

## 2018-12-15 LAB — COMPREHENSIVE METABOLIC PANEL
ALT: 18 U/L (ref 0–44)
AST: 16 U/L (ref 15–41)
Albumin: 3.1 g/dL — ABNORMAL LOW (ref 3.5–5.0)
Alkaline Phosphatase: 145 U/L — ABNORMAL HIGH (ref 38–126)
Anion gap: 11 (ref 5–15)
BUN: 33 mg/dL — ABNORMAL HIGH (ref 6–20)
CO2: 27 mmol/L (ref 22–32)
Calcium: 9.1 mg/dL (ref 8.9–10.3)
Chloride: 101 mmol/L (ref 98–111)
Creatinine, Ser: 1.32 mg/dL — ABNORMAL HIGH (ref 0.44–1.00)
GFR calc Af Amer: 53 mL/min — ABNORMAL LOW (ref >60)
GFR calc non Af Amer: 45 mL/min — ABNORMAL LOW (ref >60)
Glucose, Bld: 110 mg/dL — ABNORMAL HIGH (ref 70–99)
Potassium: 4.4 mmol/L (ref 3.5–5.1)
Sodium: 139 mmol/L (ref 135–145)
Total Bilirubin: 0.5 mg/dL (ref 0.3–1.2)
Total Protein: 7.8 g/dL (ref 6.5–8.1)

## 2018-12-15 LAB — CBC
HCT: 32.1 % — ABNORMAL LOW (ref 36.0–46.0)
Hemoglobin: 9.8 g/dL — ABNORMAL LOW (ref 12.0–15.0)
MCH: 26.2 pg (ref 26.0–34.0)
MCHC: 30.5 g/dL (ref 30.0–36.0)
MCV: 85.8 fL (ref 80.0–100.0)
Platelets: 348 10*3/uL (ref 150–400)
RBC: 3.74 MIL/uL — ABNORMAL LOW (ref 3.87–5.11)
RDW: 13.4 % (ref 11.5–15.5)
WBC: 11.6 10*3/uL — ABNORMAL HIGH (ref 4.0–10.5)
nRBC: 0 % (ref 0.0–0.2)

## 2018-12-15 LAB — MAGNESIUM: Magnesium: 2.2 mg/dL (ref 1.7–2.4)

## 2018-12-15 LAB — URIC ACID: Uric Acid, Serum: 10.8 mg/dL — ABNORMAL HIGH (ref 2.5–7.1)

## 2018-12-15 MED ORDER — CLOPIDOGREL BISULFATE 75 MG PO TABS: 75.0000 mg | ORAL_TABLET | Freq: Every day | ORAL | 3 refills | Status: DC

## 2018-12-15 NOTE — Progress Notes (Signed)
ADVANCED HF CLINIC  NOTE  Referring Physician: Dr Bettina Gavia  Primary Care: Dr Tobie Poet in [place] Primary Cardiologist: Dr Bettina Gavia  Pulmonary: Dr Alcide Clever HF: Dr [name]  HPI: Dana [name] is a 58 y.o. female with a history of obesity, diastolic heart failure, DMI, neuropathy, HTN, hyperlipidemia, CAD (most recent [facility] 08/2016 DES RCA and stent LAD total of 4 stents), anemia, OSA --> started CPAP 12/2016, PAF S/P ablation, PAD S/P L great toe and 4th toe amputation.   Admitted 4/24 - 11/11/17 with chest pain while getting up out of her recliner.  EKG unremarkable. CXR with pulmonary edema. Troponin  0.03 - 0.05. Diuresed with IV lasix with resolution of symptoms. Echo with normal EF as below. Torsemide cut back to 60 mg daily on discharge.   On June 25th she was evaluated in the ED for syncope. She was in her usual state with no complaints. She was standing at the sink and had high pitch sound in her ears. Her husband found her on kitchen floor. She was evaluated in ED and later sent home.   Zio patch 02/2018 with no arrhythmias, only occasional PACs/PVCs  Admitted in December 2019 to [facility] with increased sob. Diuresed with IV lasix and sent home.  Today she returns for HF follow up. Overall feeling fair. Complaining of muscle cramps. Remains SOB with with exertion. Not very active at home. Denies PND/Orthopnea. Appetite ok. Says she has been eating lots of take out. No fever or chills. Weight at home 248-251 pounds. SBP at home 120s over the 60s. Taking all medications.No fever or chills.   Cardiomems numbers reviewed: Reading 6 mm hg from today    - Echo 11/07/17 LVEF 65-70%, Grade 2 DD. Trivial MR, Mild LAE. - ECHO 08/2016 EF 55% RV was not clearly visualized  RHC 03/11/18 - Cardiomems device placed RA mean 10 RV 49/3 PA 50/19 (31) PCWP 16 Cardiac Output (Fick) 8.3 Cardiac Index (Fick) 3.88  Studies RHC/LHC 01/07/2017  Stents patent RA mean 7 RV 31/6 PA 31/10, mean 20 PCWP mean 7  LV 142/12 AO 134/64 Oxygen saturations: PA 66% AO 99% Cardiac Output (Fick) 7.31  Cardiac Index (Fick) 3.37 Left Main  No significant disease.  Left Anterior Descending  30% ostial stenosis. 30-40% proximal stenosis. Patent mid LAD stent. 40-50% mid LAD stenosis distal to stent.  Left Circumflex  Large OM1. 30% mid LCx stenosis after OM1. Moderate OM2 with 30-40% proximal stenosis. 30% distal LCx stenosis.  Right Coronary Artery  Serial 30-40% stenoses in the proximal RCA. Long stented segment in the PDA was patent.    Cardiac cath [facility] 08/30/16:  2. Severe stenosis of the distal RCA extending into the rPDA. 3. Patent stent in the LAD. 4. Moderate macrovascular disease elsewhere. 5. Diffuse microvascular disease seen. 6. Mild pulmonary hypertension. 7. Normal LVEDP. 8. Normal cardiac output. Interventional Summary Successful PCI / Xience Drug Eluting Stent (2.75/23 mm) of the distal Right RA 7 PCW 14 CO/CO 6.3/3  CTA 08/2016 --. No PE    Review of systems complete and found to be negative unless listed in HPI.    Past Medical History:  Diagnosis Date  . Anginal pain ([place])   . CAD (coronary artery disease)    08/2016 DES to RCA, also with history of LAD stenting.   . Diabetes ([place])   . Diastolic CHF ([place])   . Hyperlipidemia   . Hypertension   . OSA (obstructive sleep apnea)     Current Outpatient Medications  Medication Sig Dispense Refill  . albuterol (PROVENTIL HFA;VENTOLIN HFA) 108 (90 Base) MCG/ACT inhaler Inhale 2 puffs into the lungs every 6 (six) hours as needed for wheezing or shortness of breath.     Marland Kitchen aspirin EC 81 MG tablet Take 1 tablet (81 mg total) by mouth daily. 30 tablet 11  . carvedilol (COREG) 6.25 MG tablet Take 1.5 tablets (9.375 mg total) by mouth 2 (two) times daily. 90 tablet 3  . clopidogrel (PLAVIX) 75 MG tablet Take 75 mg by mouth at bedtime.     Marland Kitchen EPINEPHrine 0.3 mg/0.3 mL IJ SOAJ injection Use as directed for life-threatening allergic  reaction. 4 Device 3  . Evolocumab (REPATHA [place]) Inject into the skin.    . famotidine (PEPCID) 20 MG tablet Take 1 tablet (20 mg total) by mouth daily. 30 tablet 5  . ferrous sulfate 325 (65 FE) MG tablet Take 325 mg by mouth 2 (two) times daily with a meal.    . hydrALAZINE (APRESOLINE) 50 MG tablet Take 1.5 tablets (75 mg total) by mouth 3 (three) times daily. 150 tablet 3  . HYDROcodone-acetaminophen (NORCO) 7.5-325 MG tablet Take 1 tablet by mouth 3 (three) times daily as needed for moderate pain.     Marland Kitchen insulin NPH Human (HUMULIN N,NOVOLIN N) 100 UNIT/ML injection Inject 30 Units into the skin 2 (two) times daily before a meal.     . insulin regular (NOVOLIN R,HUMULIN R) 100 units/mL injection Inject 7-12 Units into the skin 3 (three) times daily before meals. Per sliding scale    . isosorbide mononitrate (IMDUR) 30 MG 24 hr tablet Take 0.5 tablets (15 mg total) by mouth daily. 15 tablet 6  . latanoprost (XALATAN) 0.005 % ophthalmic solution Place 1 drop into both eyes at bedtime.    . magnesium oxide (MAG-OX) 400 MG tablet Take 2 tablets (800 mg total) by mouth daily. Take 2 (400 mg) daily=800 mg 60 tablet 1  . metolazone (ZAROXOLYN) 2.5 MG tablet TAKE 1 TABLET BY MOUTH ONCE A WEEK. TAKE EVERY WEDNESDAY (Patient taking differently: Take 2.5 mg by mouth See admin instructions. Take 2.5 mg by mouth once weekly on Wednesday. May take additional 2.5 mg dose on Saturday if needed for fluid) 10 tablet 4  . nitroGLYCERIN (NITROSTAT) 0.4 MG SL tablet Place 0.4 mg under the tongue every 5 (five) minutes as needed for chest pain.     . Omega-3 Fatty Acids (FISH OIL PO) Take 1 capsule by mouth daily.    . OXYGEN Inhale 2 L into the lungs at bedtime.    . pravastatin (PRAVACHOL) 40 MG tablet Take 80 mg by mouth at bedtime.     . Probiotic CAPS Take 1 capsule by mouth daily.    . promethazine (PHENERGAN) 25 MG tablet Take 25 mg by mouth every 6 (six) hours as needed for nausea or vomiting.    Marland Kitchen  spironolactone (ALDACTONE) 25 MG tablet Take 1 tablet (25 mg total) by mouth daily. 30 tablet 3  . torsemide (DEMADEX) 20 MG tablet Take 40 mg three times a day 180 tablet 3  . Vitamin D, Ergocalciferol, (DRISDOL) 50000 units CAPS capsule Take 50,000 Units by mouth every Wednesday.     Marland Kitchen albuterol (ACCUNEB) 0.63 MG/3ML nebulizer solution Take 1 ampule by nebulization every 6 (six) hours as needed for wheezing or shortness of breath.      No current facility-administered medications for this encounter.     Allergies  Allergen Reactions  . Naproxen Hives  and Rash  . Celecoxib Other (See Comments)    Stomach pain  . Aspirin Other (See Comments)    CAN NOT TAKE DUE TO ULCERS  . Glucosamine Hives, Swelling and Other (See Comments)    Angioedema   . Metoclopramide Other (See Comments)    Facial twitching and stuttering  . Metolazone Other (See Comments)    Acute renal failure  . Shellfish-Derived Products Hives, Swelling and Other (See Comments)    Angioedema   . Tramadol Nausea And Vomiting           Social History   Socioeconomic History  . Marital status: Married    Spouse name: Not on file  . Number of children: Not on file  . Years of education: Not on file  . Highest education level: Not on file  Occupational History  . Not on file  Social Needs  . Financial resource strain: Not on file  . Food insecurity:    Worry: Not on file    Inability: Not on file  . Transportation needs:    Medical: Not on file    Non-medical: Not on file  Tobacco Use  . Smoking status: Former Research scientist (life sciences)  . Smokeless tobacco: Never Used  Substance and Sexual Activity  . Alcohol use: Not Currently  . Drug use: Not Currently  . Sexual activity: Not on file  Lifestyle  . Physical activity:    Days per week: Not on file    Minutes per session: Not on file  . Stress: Not on file  Relationships  . Social connections:    Talks on phone: Not on file    Gets together: Not on file    Attends  religious service: Not on file    Active member of club or organization: Not on file    Attends meetings of clubs or organizations: Not on file    Relationship status: Not on file  . Intimate partner violence:    Fear of current or ex partner: Not on file    Emotionally abused: Not on file    Physically abused: Not on file    Forced sexual activity: Not on file  Other Topics Concern  . Not on file  Social History Narrative  . Not on file    FamHx:  Denies h/o familial CM or SCD. Multiple family members with CAD.    Vitals:   12/15/18 1042  BP: (!) 170/78  Pulse: 75  SpO2: 98%  Weight: 118.5 kg (261 lb 3.2 oz)   Wt Readings from Last 3 Encounters:  12/15/18 118.5 kg (261 lb 3.2 oz)  09/12/18 114.9 kg (253 lb 3.2 oz)  07/28/18 112 kg (247 lb)    PHYSICAL EXAM: General:  Well appearing. No resp difficulty. Husband present.  HEENT: normal Neck: supple. no JVD. Carotids 2+ bilat; no bruits. No lymphadenopathy or thryomegaly appreciated. Cor: PMI nondisplaced. Regular rate & rhythm. No rubs, gallops or murmurs. Lungs: clear Abdomen: obese, soft, nontender, nondistended. No hepatosplenomegaly. No bruits or masses. Good bowel sounds. Extremities: no cyanosis, clubbing, rash, 1+ edema Neuro: alert & orientedx3, cranial nerves grossly intact. moves all 4 extremities w/o difficulty. Affect pleasant   ASSESSMENT & PLAN:  1. Chronic Diastolic Heart Failure - Echo 5/19 LVEF 65-70%, Grade 2 DD - s/p Cardiomems device 03/11/18. Cardiomems reading 6 mm hg today.   NYHA III. Volume status stable despite weight gain.   - Continue torsemide 40 mg three times a day + metolazone 2.5 mg on  Wednesday.  - Continue coreg to 9.375 mg twice a day.   - Continue  hydralazine to 50 mg TID - Continue imdur 15 mg daily.  - Continue spiro 25 mg daily.  2. Obesity - Body mass index is 44.14 kg/m.  - Discussed portion control and asked to increase activity.  3. OSA Continue CPAP.  4. CAD - 4  stents. 08/2016  stent RCA.  - 2018 LHC nonobstructive.  - Continue plavix, bb, and statin.  5. PAF - S/P ablation 2016. - Stressed compliance with CPAP - Holter monitor 06/2017 with occasional PACs and SVT.  - BB as above.   7. HTN:  -BP at home has been stable. Continue current regimen.  8. CKD III - Baseline creatinine appears to be 1.3 - 1.5 - Check BMET today.   Follow 4 months.   Darrick Grinder, NP  10:54 AM

## 2018-12-15 NOTE — Patient Instructions (Signed)
Lab work was done today. We will notify you of any abnormal lab work. No news is good news.  Please follow up with the Advanced Practice Provider in 4 months. We currently do not have that schedule. Please call back in 1-2 months in order to schedule that appointment. [phone].  It was a pleasure seeing you today!!

## 2018-12-17 ENCOUNTER — Telehealth (HOSPITAL_COMMUNITY): Payer: Self-pay

## 2018-12-17 MED ORDER — ALLOPURINOL 100 MG PO TABS: 200.0000 mg | ORAL_TABLET | Freq: Every day | ORAL | 3 refills | Status: DC

## 2018-12-17 MED ORDER — PREDNISONE 20 MG PO TABS: 40.0000 mg | ORAL_TABLET | Freq: Every day | ORAL | 0 refills | Status: AC

## 2018-12-17 NOTE — Telephone Encounter (Signed)
Called pt and reviewed lab work. Pt verbalized understanding and is agreeable to new medications. Orders placed to preferred pharmacy.

## 2018-12-17 NOTE — Telephone Encounter (Signed)
-----   Message from Conrad Lequire, NP sent at 12/15/2018  4:18 PM EDT ----- Uric Acid elevated. Gout flare. Please call and instruct to take prednisone 40 mg daily x3 days then start allopurinol 200 mg daily. Hemoglobin ok. Please call.

## 2018-12-18 DIAGNOSIS — G4733 Obstructive sleep apnea (adult) (pediatric): Secondary | ICD-10-CM | POA: Diagnosis not present

## 2018-12-18 DIAGNOSIS — J301 Allergic rhinitis due to pollen: Secondary | ICD-10-CM | POA: Diagnosis not present

## 2018-12-18 DIAGNOSIS — M109 Gout, unspecified: Secondary | ICD-10-CM | POA: Diagnosis not present

## 2018-12-18 DIAGNOSIS — J4520 Mild intermittent asthma, uncomplicated: Secondary | ICD-10-CM | POA: Diagnosis not present

## 2019-01-14 DIAGNOSIS — G4733 Obstructive sleep apnea (adult) (pediatric): Secondary | ICD-10-CM | POA: Diagnosis not present

## 2019-01-19 DIAGNOSIS — H40053 Ocular hypertension, bilateral: Secondary | ICD-10-CM | POA: Diagnosis not present

## 2019-02-12 ENCOUNTER — Telehealth (HOSPITAL_COMMUNITY): Payer: Self-pay | Admitting: Adult Health

## 2019-02-12 NOTE — Telephone Encounter (Signed)
  I called  regarding elevated cardiomems reading -->14.   She reports weight went up a few pounds. Says she had Brendolyn Patty last night.    She will need to take an extra 20 mg torsemide for 2 days.    Dana [name] verbalized understanding.   Amy Clegg NP-C  2:51 PM

## 2019-03-12 DIAGNOSIS — E782 Mixed hyperlipidemia: Secondary | ICD-10-CM | POA: Diagnosis not present

## 2019-03-12 DIAGNOSIS — I5022 Chronic systolic (congestive) heart failure: Secondary | ICD-10-CM | POA: Diagnosis not present

## 2019-03-12 DIAGNOSIS — E038 Other specified hypothyroidism: Secondary | ICD-10-CM | POA: Diagnosis not present

## 2019-03-12 DIAGNOSIS — Z23 Encounter for immunization: Secondary | ICD-10-CM | POA: Diagnosis not present

## 2019-03-12 DIAGNOSIS — M1009 Idiopathic gout, multiple sites: Secondary | ICD-10-CM | POA: Diagnosis not present

## 2019-03-12 DIAGNOSIS — Z6841 Body Mass Index (BMI) 40.0 and over, adult: Secondary | ICD-10-CM | POA: Diagnosis not present

## 2019-03-12 DIAGNOSIS — E1142 Type 2 diabetes mellitus with diabetic polyneuropathy: Secondary | ICD-10-CM | POA: Diagnosis not present

## 2019-03-12 DIAGNOSIS — I110 Hypertensive heart disease with heart failure: Secondary | ICD-10-CM | POA: Diagnosis not present

## 2019-03-12 DIAGNOSIS — F5101 Primary insomnia: Secondary | ICD-10-CM | POA: Diagnosis not present

## 2019-03-25 ENCOUNTER — Telehealth (HOSPITAL_COMMUNITY): Payer: Self-pay | Admitting: Adult Health

## 2019-03-25 NOTE — Telephone Encounter (Signed)
  Cardiomems reading elevated 16.   Please call and instruct to take an extra 20 mg torsemide for 2 days .   Venna Berberich NP-C  3:58 PM

## 2019-03-25 NOTE — Telephone Encounter (Signed)
Called pt to make aware of Cardiomems results. Pt aware and agreeable to med changes. No further questions at this time.

## 2019-03-25 NOTE — Telephone Encounter (Signed)
Pt aware and voiced understanding 

## 2019-03-29 IMAGING — DX DG CHEST 2V
2 series · 2 of 2 positions shown · non-contrast
Comparison: Single-view of the chest 11/02/2016 02/19/2017.

CLINICAL DATA: Weakness today. Headache and blurred vision. Chest
tightness beginning at [DATE] p.m.

EXAM:
CHEST - 2 VIEW

[w chest pa]
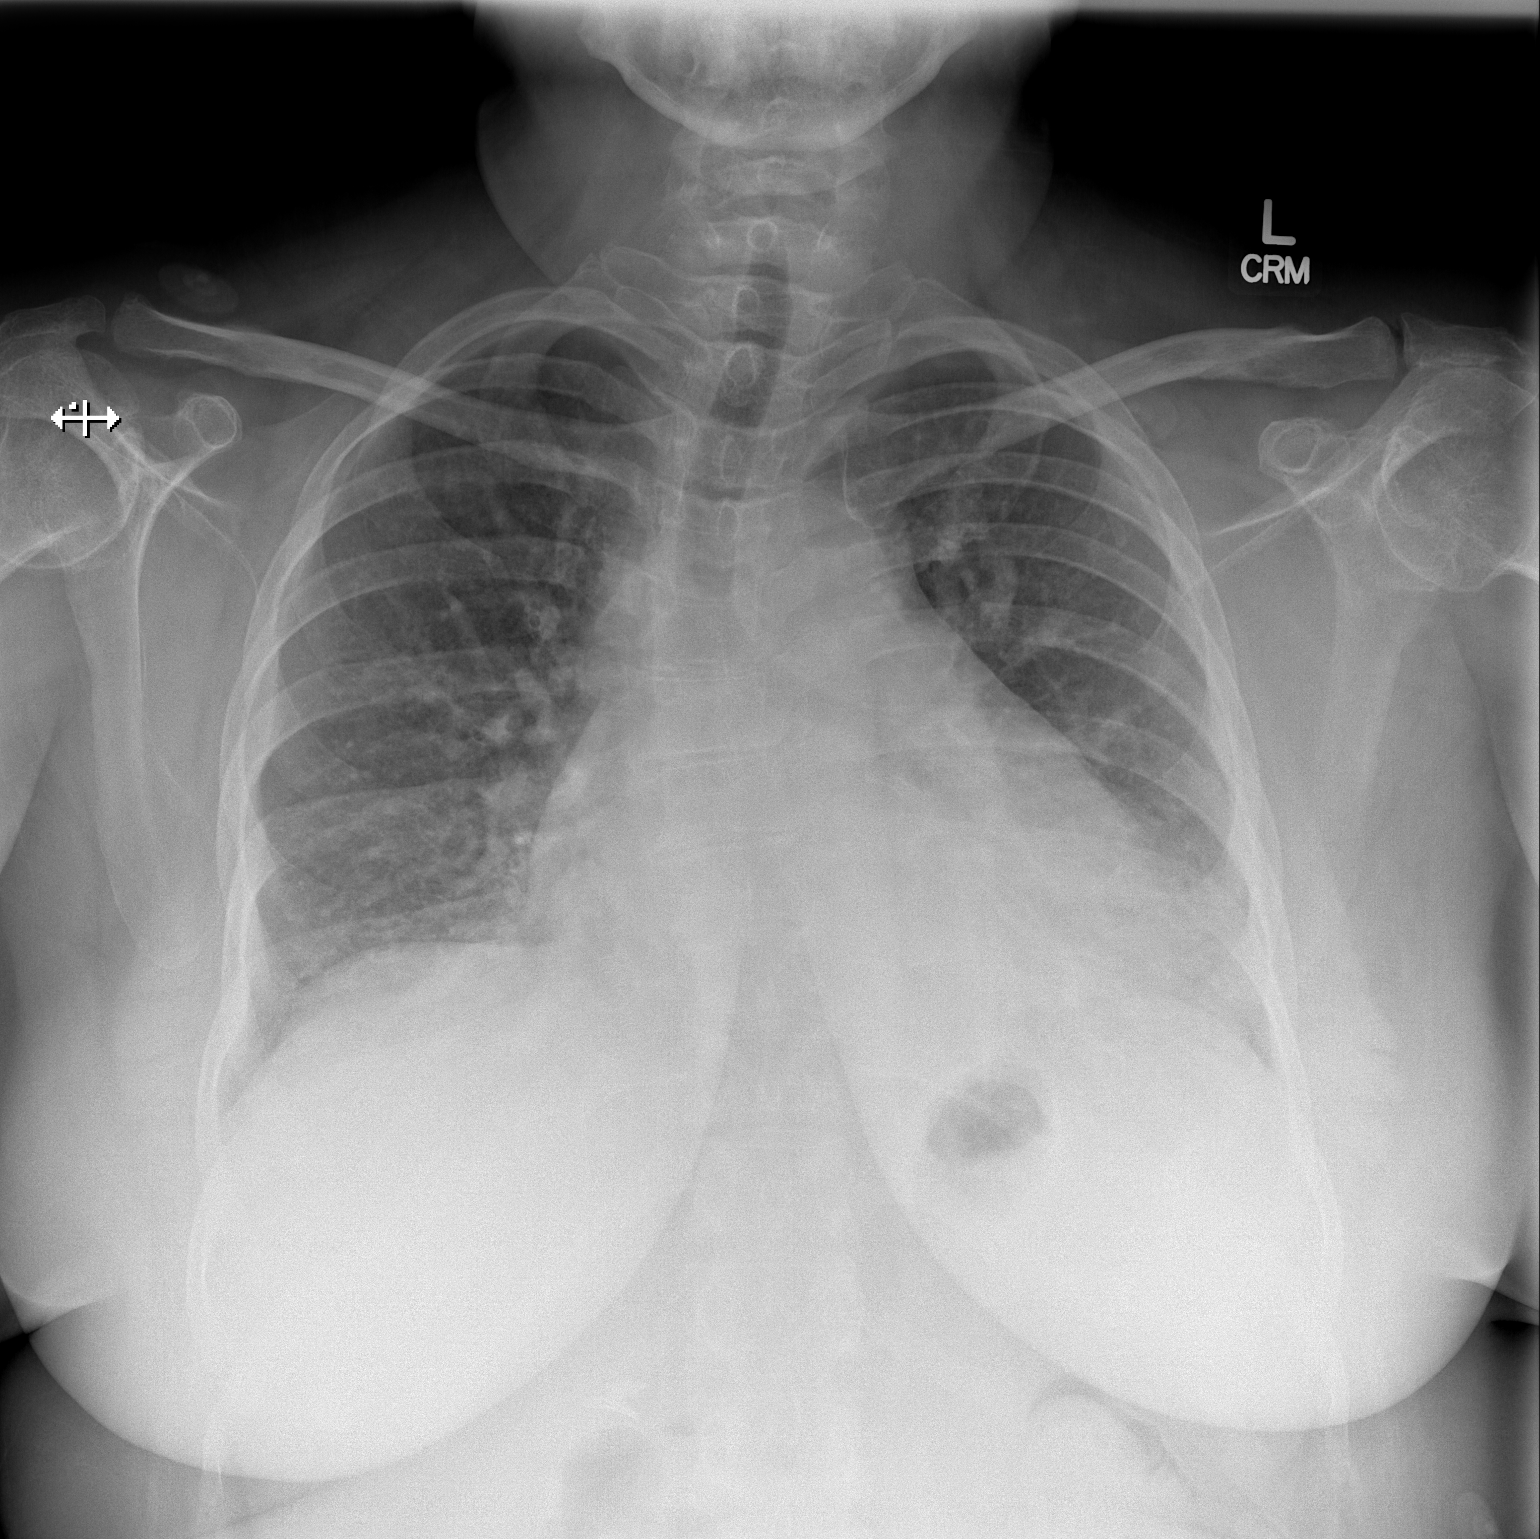

[w chest lat]
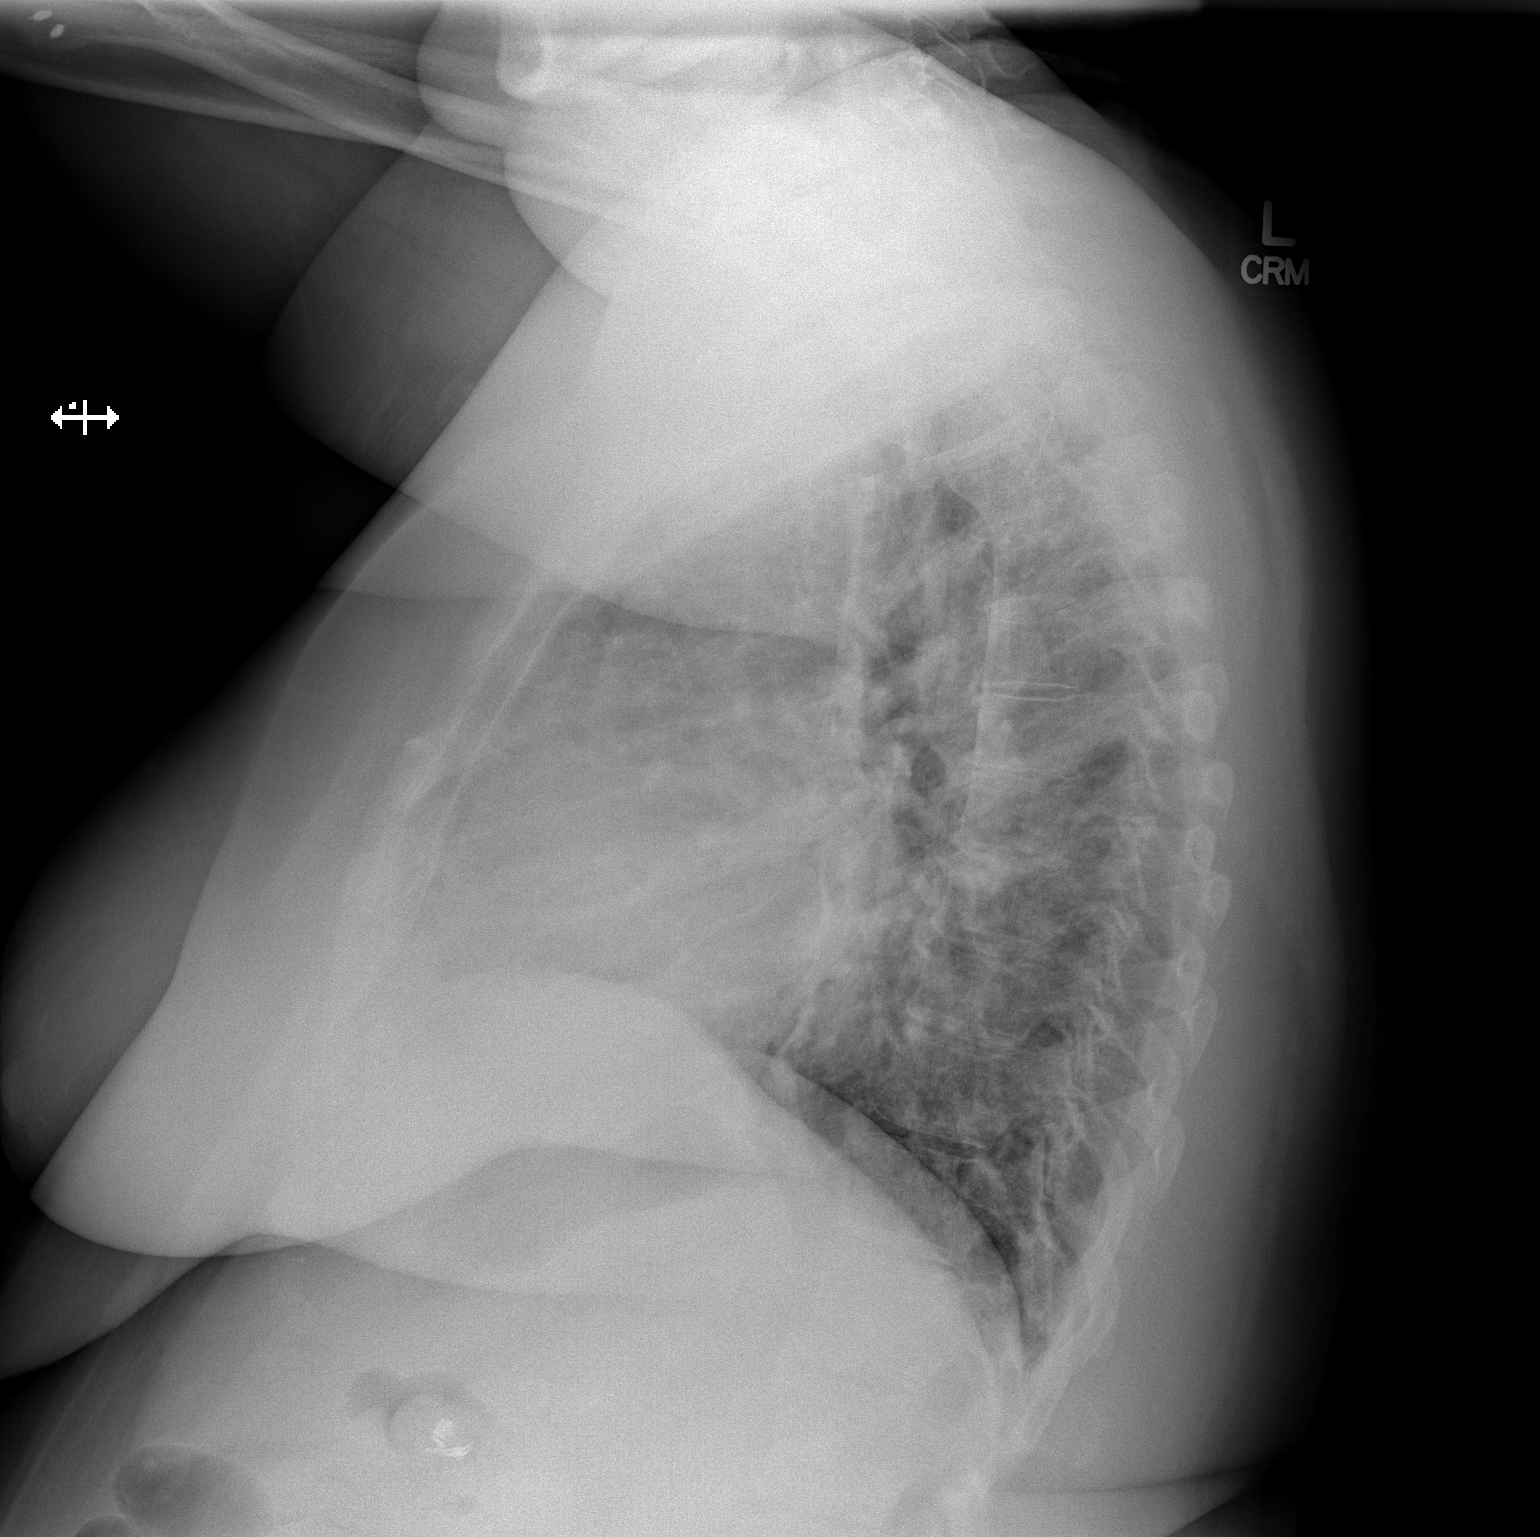

[2 of 2 positions shown; findings below may reference images not displayed]

FINDINGS: There is cardiomegaly and interstitial pulmonary edema. No
consolidative process, pneumothorax or effusion. Aortic
atherosclerosis is noted. No acute bony abnormality.
IMPRESSION: Cardiomegaly and interstitial edema.

## 2019-04-14 DIAGNOSIS — G4733 Obstructive sleep apnea (adult) (pediatric): Secondary | ICD-10-CM | POA: Diagnosis not present

## 2019-04-23 DIAGNOSIS — L97821 Non-pressure chronic ulcer of other part of left lower leg limited to breakdown of skin: Secondary | ICD-10-CM | POA: Diagnosis not present

## 2019-05-04 ENCOUNTER — Telehealth (HOSPITAL_COMMUNITY): Payer: Self-pay | Admitting: Adult Health

## 2019-05-04 NOTE — Telephone Encounter (Signed)
   Cardiomems elevated.   Instructed to take extra 20 mg torsemide today.  Already has an appointment tomorrow 10/20 in HF clinic. Check BMET at that time.   Tima verbalized understanding and appreciated the call.  Amy Clegg 3:42 PM

## 2019-05-05 ENCOUNTER — Other Ambulatory Visit: Payer: Self-pay

## 2019-05-05 ENCOUNTER — Ambulatory Visit (HOSPITAL_COMMUNITY)
Admission: RE | Admit: 2019-05-05 | Discharge: 2019-05-05 | Disposition: A | Discharge status: Home / Self Care | Payer: Medicare HMO | Source: Ambulatory Visit | Attending: Adult Health | Admitting: Adult Health

## 2019-05-05 ENCOUNTER — Encounter (HOSPITAL_COMMUNITY): Payer: Self-pay

## 2019-05-05 VITALS — BP 159/79 | HR 86 | Wt 265.8 lb

## 2019-05-05 DIAGNOSIS — Z955 Presence of coronary angioplasty implant and graft: Secondary | ICD-10-CM | POA: Diagnosis not present

## 2019-05-05 DIAGNOSIS — Z885 Allergy status to narcotic agent status: Secondary | ICD-10-CM | POA: Insufficient documentation

## 2019-05-05 DIAGNOSIS — G4733 Obstructive sleep apnea (adult) (pediatric): Secondary | ICD-10-CM | POA: Insufficient documentation

## 2019-05-05 DIAGNOSIS — Z794 Long term (current) use of insulin: Secondary | ICD-10-CM | POA: Diagnosis not present

## 2019-05-05 DIAGNOSIS — E669 Obesity, unspecified: Secondary | ICD-10-CM | POA: Diagnosis not present

## 2019-05-05 DIAGNOSIS — E785 Hyperlipidemia, unspecified: Secondary | ICD-10-CM | POA: Diagnosis not present

## 2019-05-05 DIAGNOSIS — Z6841 Body Mass Index (BMI) 40.0 and over, adult: Secondary | ICD-10-CM | POA: Diagnosis not present

## 2019-05-05 DIAGNOSIS — Z886 Allergy status to analgesic agent status: Secondary | ICD-10-CM | POA: Insufficient documentation

## 2019-05-05 DIAGNOSIS — Z7902 Long term (current) use of antithrombotics/antiplatelets: Secondary | ICD-10-CM | POA: Diagnosis not present

## 2019-05-05 DIAGNOSIS — E1022 Type 1 diabetes mellitus with diabetic chronic kidney disease: Secondary | ICD-10-CM | POA: Insufficient documentation

## 2019-05-05 DIAGNOSIS — Z87891 Personal history of nicotine dependence: Secondary | ICD-10-CM | POA: Insufficient documentation

## 2019-05-05 DIAGNOSIS — I480 Paroxysmal atrial fibrillation: Secondary | ICD-10-CM | POA: Insufficient documentation

## 2019-05-05 DIAGNOSIS — I2510 Atherosclerotic heart disease of native coronary artery without angina pectoris: Secondary | ICD-10-CM | POA: Diagnosis not present

## 2019-05-05 DIAGNOSIS — Z8249 Family history of ischemic heart disease and other diseases of the circulatory system: Secondary | ICD-10-CM | POA: Diagnosis not present

## 2019-05-05 DIAGNOSIS — Z79899 Other long term (current) drug therapy: Secondary | ICD-10-CM | POA: Insufficient documentation

## 2019-05-05 DIAGNOSIS — N183 Chronic kidney disease, stage 3 unspecified: Secondary | ICD-10-CM | POA: Insufficient documentation

## 2019-05-05 DIAGNOSIS — Z9981 Dependence on supplemental oxygen: Secondary | ICD-10-CM | POA: Insufficient documentation

## 2019-05-05 DIAGNOSIS — Z888 Allergy status to other drugs, medicaments and biological substances status: Secondary | ICD-10-CM | POA: Diagnosis not present

## 2019-05-05 DIAGNOSIS — I5032 Chronic diastolic (congestive) heart failure: Secondary | ICD-10-CM | POA: Insufficient documentation

## 2019-05-05 DIAGNOSIS — I130 Hypertensive heart and chronic kidney disease with heart failure and stage 1 through stage 4 chronic kidney disease, or unspecified chronic kidney disease: Secondary | ICD-10-CM | POA: Insufficient documentation

## 2019-05-05 LAB — BASIC METABOLIC PANEL
Anion gap: 10 (ref 5–15)
BUN: 18 mg/dL (ref 6–20)
CO2: 23 mmol/L (ref 22–32)
Calcium: 8.8 mg/dL — ABNORMAL LOW (ref 8.9–10.3)
Chloride: 106 mmol/L (ref 98–111)
Creatinine, Ser: 1.21 mg/dL — ABNORMAL HIGH (ref 0.44–1.00)
GFR calc Af Amer: 58 mL/min — ABNORMAL LOW (ref >60)
GFR calc non Af Amer: 50 mL/min — ABNORMAL LOW (ref >60)
Glucose, Bld: 121 mg/dL — ABNORMAL HIGH (ref 70–99)
Potassium: 4.6 mmol/L (ref 3.5–5.1)
Sodium: 139 mmol/L (ref 135–145)

## 2019-05-05 LAB — BRAIN NATRIURETIC PEPTIDE: B Natriuretic Peptide: 47.7 pg/mL (ref 0.0–100.0)

## 2019-05-05 MED ORDER — ISOSORBIDE MONONITRATE ER 30 MG PO TB24: 30.0000 mg | ORAL_TABLET | Freq: Every day | ORAL | 3 refills | Status: DC

## 2019-05-05 NOTE — Progress Notes (Signed)
ADVANCED HF CLINIC  NOTE  Referring Physician: Dr Bettina Gavia  Primary Care: Dr Tobie Poet in [place] Primary Cardiologist: Dr Bettina Gavia  Pulmonary: Dr Alcide Clever HF: Dr [name]  HPI: Dana [name] is a 58 y.o. female with a history of obesity, diastolic heart failure, DMI, neuropathy, HTN, hyperlipidemia, CAD (most recent [facility] 08/2016 DES RCA and stent LAD total of 4 stents), anemia, OSA --> started CPAP 12/2016, PAF S/P ablation, PAD S/P L great toe and 4th toe amputation.   Admitted 4/24 - 11/11/17 with chest pain while getting up out of her recliner.  EKG unremarkable. CXR with pulmonary edema. Troponin  0.03 - 0.05. Diuresed with IV lasix with resolution of symptoms. Echo with normal EF as below. Torsemide cut back to 60 mg daily on discharge.   On June 25th she was evaluated in the ED for syncope. She was in her usual state with no complaints. She was standing at the sink and had high pitch sound in her ears. Her husband found her on kitchen floor. She was evaluated in ED and later sent home.   Zio patch 02/2018 with no arrhythmias, only occasional PACs/PVCs  Admitted in December 2019 to [facility] with increased sob. Diuresed with IV lasix and sent home.  Today she returns for HF follow up. Over the last few weeks she has been taking extra torsemide due to elevated cardiomems. Overall feeling fair. SOB with exertion. Denies PND/Orthopnea. Increased leg edema. No chest pain. Using CPAP every night.  Appetite ok. No fever or chills. Weight at home trending up. Taking all medications  Cardiomems numbers reviewed: Reading 1mm hg from today    - Echo 11/07/17 LVEF 65-70%, Grade 2 DD. Trivial MR, Mild LAE. - ECHO 08/2016 EF 55% RV was not clearly visualized  RHC 03/11/18 - Cardiomems device placed RA mean 10 RV 49/3 PA 50/19 (31) PCWP 16 Cardiac Output (Fick) 8.3 Cardiac Index (Fick) 3.88  Studies RHC/LHC 01/07/2017  Stents patent RA mean 7 RV 31/6 PA 31/10, mean 20 PCWP mean 7 LV 142/12 AO  134/64 Oxygen saturations: PA 66% AO 99% Cardiac Output (Fick) 7.31  Cardiac Index (Fick) 3.37 Left Main  No significant disease.  Left Anterior Descending  30% ostial stenosis. 30-40% proximal stenosis. Patent mid LAD stent. 40-50% mid LAD stenosis distal to stent.  Left Circumflex  Large OM1. 30% mid LCx stenosis after OM1. Moderate OM2 with 30-40% proximal stenosis. 30% distal LCx stenosis.  Right Coronary Artery  Serial 30-40% stenoses in the proximal RCA. Long stented segment in the PDA was patent.    Cardiac cath [facility] 08/30/16:  2. Severe stenosis of the distal RCA extending into the rPDA. 3. Patent stent in the LAD. 4. Moderate macrovascular disease elsewhere. 5. Diffuse microvascular disease seen. 6. Mild pulmonary hypertension. 7. Normal LVEDP. 8. Normal cardiac output. Interventional Summary Successful PCI / Xience Drug Eluting Stent (2.75/23 mm) of the distal Right RA 7 PCW 14 CO/CO 6.3/3  CTA 08/2016 --. No PE    Review of systems complete and found to be negative unless listed in HPI.    Past Medical History:  Diagnosis Date  . Anginal pain ([place])   . CAD (coronary artery disease)    08/2016 DES to RCA, also with history of LAD stenting.   . Diabetes ([place])   . Diastolic CHF ([place])   . Hyperlipidemia   . Hypertension   . OSA (obstructive sleep apnea)     Current Outpatient Medications  Medication Sig Dispense Refill  .  allopurinol (ZYLOPRIM) 100 MG tablet Take 2 tablets (200 mg total) by mouth daily. 180 tablet 3  . carvedilol (COREG) 6.25 MG tablet Take 1.5 tablets (9.375 mg total) by mouth 2 (two) times daily. 90 tablet 3  . clopidogrel (PLAVIX) 75 MG tablet Take 1 tablet (75 mg total) by mouth at bedtime. 90 tablet 3  . EPINEPHrine 0.3 mg/0.3 mL IJ SOAJ injection Use as directed for life-threatening allergic reaction. 4 Device 3  . Evolocumab (REPATHA [place]) Inject into the skin.    . famotidine (PEPCID) 20 MG tablet Take 1 tablet (20 mg total) by mouth  daily. 30 tablet 5  . ferrous sulfate 325 (65 FE) MG tablet Take 325 mg by mouth 2 (two) times daily with a meal.    . hydrALAZINE (APRESOLINE) 50 MG tablet Take 1.5 tablets (75 mg total) by mouth 3 (three) times daily. 150 tablet 3  . HYDROcodone-acetaminophen (NORCO) 7.5-325 MG tablet Take 1 tablet by mouth 3 (three) times daily as needed for moderate pain.     Marland Kitchen insulin regular (NOVOLIN R,HUMULIN R) 100 units/mL injection Inject 7-12 Units into the skin 3 (three) times daily before meals. Per sliding scale    . isosorbide mononitrate (IMDUR) 30 MG 24 hr tablet Take 0.5 tablets (15 mg total) by mouth daily. 15 tablet 6  . latanoprost (XALATAN) 0.005 % ophthalmic solution Place 1 drop into both eyes at bedtime.    . magnesium oxide (MAG-OX) 400 MG tablet Take 2 tablets (800 mg total) by mouth daily. Take 2 (400 mg) daily=800 mg 60 tablet 1  . metolazone (ZAROXOLYN) 2.5 MG tablet TAKE 1 TABLET BY MOUTH ONCE A WEEK. TAKE EVERY WEDNESDAY (Patient taking differently: Take 2.5 mg by mouth See admin instructions. Take 2.5 mg by mouth once weekly on Wednesday. May take additional 2.5 mg dose on Saturday if needed for fluid) 10 tablet 4  . nitroGLYCERIN (NITROSTAT) 0.4 MG SL tablet Place 0.4 mg under the tongue every 5 (five) minutes as needed for chest pain.     . Omega-3 Fatty Acids (FISH OIL PO) Take 1 capsule by mouth daily.    . OXYGEN Inhale 2 L into the lungs at bedtime.    . pravastatin (PRAVACHOL) 40 MG tablet Take 80 mg by mouth at bedtime.     . Probiotic CAPS Take 1 capsule by mouth daily.    . promethazine (PHENERGAN) 25 MG tablet Take 25 mg by mouth every 6 (six) hours as needed for nausea or vomiting.    Marland Kitchen spironolactone (ALDACTONE) 25 MG tablet Take 1 tablet (25 mg total) by mouth daily. 30 tablet 3  . torsemide (DEMADEX) 20 MG tablet Take 40 mg three times a day 180 tablet 3  . Vitamin D, Ergocalciferol, (DRISDOL) 50000 units CAPS capsule Take 50,000 Units by mouth every Wednesday.     .  insulin NPH Human (HUMULIN N,NOVOLIN N) 100 UNIT/ML injection Inject 30 Units into the skin 2 (two) times daily before a meal.      No current facility-administered medications for this encounter.     Allergies  Allergen Reactions  . Naproxen Hives and Rash  . Celecoxib Other (See Comments)    Stomach pain  . Aspirin Other (See Comments)    CAN NOT TAKE DUE TO ULCERS  . Glucosamine Hives, Swelling and Other (See Comments)    Angioedema   . Metoclopramide Other (See Comments)    Facial twitching and stuttering  . Metolazone Other (See Comments)  Acute renal failure  . Shellfish-Derived Products Hives, Swelling and Other (See Comments)    Angioedema   . Tramadol Nausea And Vomiting           Social History   Socioeconomic History  . Marital status: Married    Spouse name: Not on file  . Number of children: Not on file  . Years of education: Not on file  . Highest education level: Not on file  Occupational History  . Not on file  Social Needs  . Financial resource strain: Not on file  . Food insecurity    Worry: Not on file    Inability: Not on file  . Transportation needs    Medical: Not on file    Non-medical: Not on file  Tobacco Use  . Smoking status: Former Research scientist (life sciences)  . Smokeless tobacco: Never Used  Substance and Sexual Activity  . Alcohol use: Not Currently  . Drug use: Not Currently  . Sexual activity: Not on file  Lifestyle  . Physical activity    Days per week: Not on file    Minutes per session: Not on file  . Stress: Not on file  Relationships  . Social Herbalist on phone: Not on file    Gets together: Not on file    Attends religious service: Not on file    Active member of club or organization: Not on file    Attends meetings of clubs or organizations: Not on file    Relationship status: Not on file  . Intimate partner violence    Fear of current or ex partner: Not on file    Emotionally abused: Not on file    Physically  abused: Not on file    Forced sexual activity: Not on file  Other Topics Concern  . Not on file  Social History Narrative  . Not on file    FamHx:  Denies h/o familial CM or SCD. Multiple family members with CAD.    Vitals:   05/05/19 1125  BP: (!) 159/79  Pulse: 86  SpO2: 99%  Weight: 120.6 kg (265 lb 12.8 oz)   Wt Readings from Last 3 Encounters:  05/05/19 120.6 kg (265 lb 12.8 oz)  12/15/18 118.5 kg (261 lb 3.2 oz)  09/12/18 114.9 kg (253 lb 3.2 oz)    PHYSICAL EXAM: General:  Well appearing. No resp difficulty HEENT: normal Neck: supple. JVP 11-12 . Carotids 2+ bilat; no bruits. No lymphadenopathy or thryomegaly appreciated. Cor: PMI nondisplaced. Regular rate & rhythm. No rubs, gallops or murmurs. Lungs: clear Abdomen: soft, nontender, nondistended. No hepatosplenomegaly. No bruits or masses. Good bowel sounds. Extremities: no cyanosis, clubbing, rash, R and LLE 2+ edema Neuro: alert & orientedx3, cranial nerves grossly intact. moves all 4 extremities w/o difficulty. Affect pleasant   ASSESSMENT & PLAN:  1. Chronic Diastolic Heart Failure - Echo 5/19 LVEF 65-70%, Grade 2 DD - s/p Cardiomems device 03/11/18. Cardiomems reading 13  mm hg today.   NYHA III. Volume status elevated. - Continue torsemide 40 mg three times a day + metolazone 2.5 mg on Wednesday. She will take an extra metolazone today.  - Continue coreg to 9.375 mg twice a day.   - Continue  hydralazine 75 mg three times a day  - Increase imdur 30  mg daily.  - Continue spiro 25 mg daily.  2. Obesity - Body mass index is 44.92 kg/m.  - Discussed portion control 3. OSA Continue CPAP.  4.  CAD - 4 stents. 08/2016  stent RCA.  - 2018 LHC nonobstructive. No chest pain.  - Continue plavix, bb, and statin.  5. PAF - S/P ablation 2016. - Stressed compliance with CPAP - Holter monitor 06/2017 with occasional PACs and SVT.  - BB as above.   7. HTN:  -BP at home has been stable. Continue current  regimen.  8. CKD III - Baseline creatinine appears to be 1.3 - 1.5 - Check BMET today   Follow up in 4 weeks to reassess volume status. Discussed low salt food choices and limiting fluid intake to < 2 liters per day.   Darrick Grinder, NP  11:37 AM

## 2019-05-05 NOTE — Patient Instructions (Signed)
Lab work done today. We will notify you of any abnormal lab work. No news is good news!  INCREASE Imdur to 30mg  (1 tab) daily.  PLEASE Take Metolazone tab today ONLY.  Please follow up with the [facility] in 4 weeks.  At the [facility], you and your health needs are our priority. As part of our continuing mission to provide you with exceptional heart care, we have created designated Provider Care Teams. These Care Teams include your primary Cardiologist (physician) and Advanced Practice Providers (APPs- Physician Assistants and Nurse Practitioners) who all work together to provide you with the care you need, when you need it.   You may see any of the following providers on your designated Care Team at your next follow up: Marland Kitchen Dr [name] . Dr Loralie Champagne . Darrick Grinder, NP . Lyda Jester, PA   Please be sure to bring in all your medications bottles to every appointment.

## 2019-06-03 ENCOUNTER — Encounter (HOSPITAL_COMMUNITY): Payer: Medicare HMO

## 2019-06-04 ENCOUNTER — Ambulatory Visit (HOSPITAL_COMMUNITY)
Admission: RE | Admit: 2019-06-04 | Discharge: 2019-06-04 | Disposition: A | Discharge status: Home / Self Care | Payer: Medicare HMO | Source: Ambulatory Visit | Attending: Adult Health | Admitting: Adult Health

## 2019-06-04 ENCOUNTER — Encounter (HOSPITAL_COMMUNITY): Payer: Self-pay

## 2019-06-04 ENCOUNTER — Other Ambulatory Visit: Payer: Self-pay

## 2019-06-04 VITALS — BP 132/69 | Wt 261.0 lb

## 2019-06-04 DIAGNOSIS — Z9981 Dependence on supplemental oxygen: Secondary | ICD-10-CM | POA: Diagnosis not present

## 2019-06-04 DIAGNOSIS — Z886 Allergy status to analgesic agent status: Secondary | ICD-10-CM | POA: Diagnosis not present

## 2019-06-04 DIAGNOSIS — E785 Hyperlipidemia, unspecified: Secondary | ICD-10-CM | POA: Diagnosis not present

## 2019-06-04 DIAGNOSIS — Z6841 Body Mass Index (BMI) 40.0 and over, adult: Secondary | ICD-10-CM | POA: Diagnosis not present

## 2019-06-04 DIAGNOSIS — Z888 Allergy status to other drugs, medicaments and biological substances status: Secondary | ICD-10-CM | POA: Insufficient documentation

## 2019-06-04 DIAGNOSIS — I2510 Atherosclerotic heart disease of native coronary artery without angina pectoris: Secondary | ICD-10-CM | POA: Diagnosis not present

## 2019-06-04 DIAGNOSIS — I5032 Chronic diastolic (congestive) heart failure: Secondary | ICD-10-CM | POA: Insufficient documentation

## 2019-06-04 DIAGNOSIS — Z89422 Acquired absence of other left toe(s): Secondary | ICD-10-CM | POA: Insufficient documentation

## 2019-06-04 DIAGNOSIS — Z91013 Allergy to seafood: Secondary | ICD-10-CM | POA: Insufficient documentation

## 2019-06-04 DIAGNOSIS — Z8249 Family history of ischemic heart disease and other diseases of the circulatory system: Secondary | ICD-10-CM | POA: Insufficient documentation

## 2019-06-04 DIAGNOSIS — E669 Obesity, unspecified: Secondary | ICD-10-CM | POA: Insufficient documentation

## 2019-06-04 DIAGNOSIS — G4733 Obstructive sleep apnea (adult) (pediatric): Secondary | ICD-10-CM

## 2019-06-04 DIAGNOSIS — Z794 Long term (current) use of insulin: Secondary | ICD-10-CM | POA: Diagnosis not present

## 2019-06-04 DIAGNOSIS — Z7902 Long term (current) use of antithrombotics/antiplatelets: Secondary | ICD-10-CM | POA: Insufficient documentation

## 2019-06-04 DIAGNOSIS — Z87891 Personal history of nicotine dependence: Secondary | ICD-10-CM | POA: Diagnosis not present

## 2019-06-04 DIAGNOSIS — I480 Paroxysmal atrial fibrillation: Secondary | ICD-10-CM | POA: Insufficient documentation

## 2019-06-04 DIAGNOSIS — F1021 Alcohol dependence, in remission: Secondary | ICD-10-CM | POA: Insufficient documentation

## 2019-06-04 DIAGNOSIS — E1022 Type 1 diabetes mellitus with diabetic chronic kidney disease: Secondary | ICD-10-CM | POA: Insufficient documentation

## 2019-06-04 DIAGNOSIS — Z79899 Other long term (current) drug therapy: Secondary | ICD-10-CM | POA: Diagnosis not present

## 2019-06-04 DIAGNOSIS — Z9049 Acquired absence of other specified parts of digestive tract: Secondary | ICD-10-CM | POA: Diagnosis not present

## 2019-06-04 DIAGNOSIS — N183 Chronic kidney disease, stage 3 unspecified: Secondary | ICD-10-CM

## 2019-06-04 DIAGNOSIS — I130 Hypertensive heart and chronic kidney disease with heart failure and stage 1 through stage 4 chronic kidney disease, or unspecified chronic kidney disease: Secondary | ICD-10-CM | POA: Diagnosis not present

## 2019-06-04 DIAGNOSIS — I272 Pulmonary hypertension, unspecified: Secondary | ICD-10-CM | POA: Insufficient documentation

## 2019-06-04 DIAGNOSIS — Z885 Allergy status to narcotic agent status: Secondary | ICD-10-CM | POA: Diagnosis not present

## 2019-06-04 DIAGNOSIS — I471 Supraventricular tachycardia: Secondary | ICD-10-CM | POA: Insufficient documentation

## 2019-06-04 DIAGNOSIS — Z955 Presence of coronary angioplasty implant and graft: Secondary | ICD-10-CM | POA: Insufficient documentation

## 2019-06-04 MED ORDER — SPIRONOLACTONE 25 MG PO TABS: 25.0000 mg | ORAL_TABLET | Freq: Every day | ORAL | 3 refills | Status: DC

## 2019-06-04 NOTE — Addendum Note (Signed)
Encounter addended by: Harvie Junior, CMA on: 06/04/2019 10:24 AM  Actions taken: Clinical Note Signed

## 2019-06-04 NOTE — Patient Instructions (Signed)
Spironolactone refilled.  Order for labs faxed to [facility].  Virtual visit in 3 months.

## 2019-06-04 NOTE — Progress Notes (Signed)
Refill of spironolactone sent. Order for bmet faxed to [facility].  Virtual visit scheduled. Called and spoke with patient about labs and visit.

## 2019-06-04 NOTE — Progress Notes (Signed)
Heart Failure TeleHealth Note  Due to national recommendations of social distancing due to [place] 19, Audio/video telehealth visit is felt to be most appropriate for this patient at this time.  See MyChart message from today for patient consent regarding telehealth for [name].  Date:  06/04/2019   ID:  Dana [name], DOB Sep 26, 1960, MRN [id number]  Location: Home  Provider location: [place] [facility] Type of Visit: Established patient  PCP:  [name], MD  Cardiologist:  No primary care provider on file. Primary HF: Dr [name]   Chief Complaint: Heart Failure   History of Present Illness: Dana [name] is a 58 y.o. female with a history of obesity, diastolic heart failure, DMI, neuropathy, HTN, hyperlipidemia, CAD (most recent [facility] 08/2016 DES RCA and stent LAD total of 4 stents), anemia, OSA --> started CPAP 12/2016, PAF S/P ablation, PAD S/P L great toe and 4th toe amputation.   She presents via Psychiatric nurse for a telehealth visit today.  Overall feeling fine. Denies SOB/PND/Orthopnea. She sleeps with 2-3 pillows chronically. Continue CPAP every night. Tries to use stationary bike 3 days a week. Having intermittent leg edema.  Appetite ok. Last night she had fritos for snack but says she has been doing a little better with what she is eating. No fever or chills. Weight at home 262-263  pounds. Taking all medications but has been out of spironolactone.    she denies symptoms worrisome for COVID 19.  Echo 11/07/17 LVEF 65-70%, Grade 2 DD. Trivial MR, Mild LAE. - ECHO 08/2016 EF 55% RV was not clearly visualized  RHC 03/11/18 - Cardiomems device placed RA mean 10 RV 49/3 PA 50/19 (31) PCWP 16 Cardiac Output (Fick) 8.3 Cardiac Index (Fick) 3.88  Studies RHC/LHC 01/07/2017  Stents patent RA mean 7 RV 31/6 PA 31/10, mean 20 PCWP mean 7 LV 142/12 AO 134/64 Oxygen saturations: PA 66% AO 99% Cardiac Output (Fick) 7.31  Cardiac Index (Fick)  3.37 Left Main  No significant disease.  Left Anterior Descending  30% ostial stenosis. 30-40% proximal stenosis. Patent mid LAD stent. 40-50% mid LAD stenosis distal to stent.  Left Circumflex  Large OM1. 30% mid LCx stenosis after OM1. Moderate OM2 with 30-40% proximal stenosis. 30% distal LCx stenosis.  Right Coronary Artery  Serial 30-40% stenoses in the proximal RCA. Long stented segment in the PDA was patent.    Cardiac cath [facility] 08/30/16:  2. Severe stenosis of the distal RCA extending into the rPDA. 3. Patent stent in the LAD. 4. Moderate macrovascular disease elsewhere. 5. Diffuse microvascular disease seen. 6. Mild pulmonary hypertension. 7. Normal LVEDP. 8. Normal cardiac output. Interventional Summary Successful PCI / Xience Drug Eluting Stent (2.75/23 mm) of the distal Right RA 7 PCW 14 CO/CO 6.3/3  CTA 08/2016 --. No PE    Past Medical History:  Diagnosis Date  . Anginal pain ([place])   . CAD (coronary artery disease)    08/2016 DES to RCA, also with history of LAD stenting.   . Diabetes ([place])   . Diastolic CHF ([place])   . Hyperlipidemia   . Hypertension   . OSA (obstructive sleep apnea)    Past Surgical History:  Procedure Laterality Date  . CARPAL TUNNEL RELEASE    . CESAREAN SECTION    . CHOLECYSTECTOMY    . EYE SURGERY    . RIGHT/LEFT HEART CATH AND CORONARY ANGIOGRAPHY N/A 01/07/2017   Procedure: Right/Left Heart Cath and Coronary Angiography;  Surgeon:  Larey Dresser, MD;  Location: [place] CV LAB;  Service: Cardiovascular;  Laterality: N/A;  . ROTATOR CUFF REPAIR    . TOE AMPUTATION Left      Current Outpatient Medications  Medication Sig Dispense Refill  . carvedilol (COREG) 6.25 MG tablet Take 1.5 tablets (9.375 mg total) by mouth 2 (two) times daily. 90 tablet 3  . clopidogrel (PLAVIX) 75 MG tablet Take 1 tablet (75 mg total) by mouth at bedtime. 90 tablet 3  . EPINEPHrine 0.3 mg/0.3 mL IJ SOAJ injection Use as directed for  life-threatening allergic reaction. 4 Device 3  . Evolocumab (REPATHA [place]) Inject into the skin.    . famotidine (PEPCID) 20 MG tablet Take 1 tablet (20 mg total) by mouth daily. 30 tablet 5  . ferrous sulfate 325 (65 FE) MG tablet Take 325 mg by mouth 2 (two) times daily with a meal.    . hydrALAZINE (APRESOLINE) 50 MG tablet Take 1.5 tablets (75 mg total) by mouth 3 (three) times daily. 150 tablet 3  . HYDROcodone-acetaminophen (NORCO) 7.5-325 MG tablet Take 1 tablet by mouth 3 (three) times daily as needed for moderate pain.     Marland Kitchen insulin NPH Human (HUMULIN N,NOVOLIN N) 100 UNIT/ML injection Inject 30 Units into the skin 2 (two) times daily before a meal.     . insulin regular (NOVOLIN R,HUMULIN R) 100 units/mL injection Inject 7-12 Units into the skin 3 (three) times daily before meals. Per sliding scale    . isosorbide mononitrate (IMDUR) 30 MG 24 hr tablet Take 1 tablet (30 mg total) by mouth daily. 90 tablet 3  . latanoprost (XALATAN) 0.005 % ophthalmic solution Place 1 drop into both eyes at bedtime.    . magnesium oxide (MAG-OX) 400 MG tablet Take 2 tablets (800 mg total) by mouth daily. Take 2 (400 mg) daily=800 mg 60 tablet 1  . metolazone (ZAROXOLYN) 2.5 MG tablet TAKE 1 TABLET BY MOUTH ONCE A WEEK. TAKE EVERY WEDNESDAY (Patient taking differently: Take 2.5 mg by mouth See admin instructions. Take 2.5 mg by mouth once weekly on Wednesday. May take additional 2.5 mg dose on Saturday if needed for fluid) 10 tablet 4  . nitroGLYCERIN (NITROSTAT) 0.4 MG SL tablet Place 0.4 mg under the tongue every 5 (five) minutes as needed for chest pain.     . Omega-3 Fatty Acids (FISH OIL PO) Take 1 capsule by mouth daily.    . OXYGEN Inhale 2 L into the lungs at bedtime.    . pravastatin (PRAVACHOL) 40 MG tablet Take 80 mg by mouth at bedtime.     . Probiotic CAPS Take 1 capsule by mouth daily.    . promethazine (PHENERGAN) 25 MG tablet Take 25 mg by mouth every 6 (six) hours as needed for nausea or  vomiting.    Marland Kitchen spironolactone (ALDACTONE) 25 MG tablet Take 1 tablet (25 mg total) by mouth daily. 30 tablet 3  . torsemide (DEMADEX) 20 MG tablet Take 40 mg three times a day 180 tablet 3  . Vitamin D, Ergocalciferol, (DRISDOL) 50000 units CAPS capsule Take 50,000 Units by mouth every Wednesday.     Marland Kitchen allopurinol (ZYLOPRIM) 100 MG tablet Take 2 tablets (200 mg total) by mouth daily. 180 tablet 3   No current facility-administered medications for this encounter.     Allergies:   Naproxen, Celecoxib, Aspirin, Glucosamine, Metoclopramide, Metolazone, Shellfish-derived products, and Tramadol   Social History:  The patient  reports that she has quit smoking. She  has never used smokeless tobacco. She reports previous alcohol use. She reports previous drug use.   Family History:  The patient's family history includes Hypertension in her father.   ROS:  Please see the history of present illness.   All other systems are personally reviewed and negative.  Vitals:   06/04/19 0954  BP: 132/69    Exam:  Tele Health Call; Exam is subjective General:  Speaks in full sentences. No resp difficulty. Lungs: Normal respiratory effort with conversation.  Abdomen: Non-distended per patient report Extremities: Pt denies edema. Neuro: Alert & oriented x 3.   Recent Labs: 12/15/2018: ALT 18; Hemoglobin 9.8; Magnesium 2.2; Platelets 348 05/05/2019: B Natriuretic Peptide 47.7; BUN 18; Creatinine, Ser 1.21; Potassium 4.6; Sodium 139  Personally reviewed   Wt Readings from Last 3 Encounters:  06/04/19 118.4 kg (261 lb)  05/05/19 120.6 kg (265 lb 12.8 oz)  12/15/18 118.5 kg (261 lb 3.2 oz)      ASSESSMENT AND PLAN: 1. Chronic Diastolic Heart Failure - Echo 5/19 LVEF 65-70%, Grade 2 DD - s/p Cardiomems device 03/11/18. Cardiomems- 13 mm hg  NYHA III. Volume status sounds ok. She has much better control on high sodium foods.   - Continue torsemide 40 mg three times a day + metolazone 2.5 mg on Wednesday.  She will take an extra metolazone today.  - Continue coreg to 9.375 mg twice a day.   - Continue  hydralazine 75 mg three times a day  - Increase imdur 30  mg daily.  - Refill spiro 25 mg daily. Check BMET the end of the month with her PCP. We will fax an order.    2. Obesity Body mass index is 44.11 kg/m. - Discussed portion control. Encouraged to continue exercising.  3. OSA Continue CPAP every night. 4. CAD - 4 stents. 08/2016  stent RCA.  - 2018 LHC nonobstructive. No chest pain.  - Continue plavix, bb, and repatha.   5. PAF - S/P ablation 2016. - Stressed compliance with CPAP - Holter monitor 06/2017 with occasional PACs and SVT.  - BB as above.   7. HTN:  . Continue current regimen.  8. CKD III - Baseline creatinine appears to be 1.3 - 1.5 - Check BMET    COVID screen The patient does not have any symptoms that suggest any further testing/ screening at this time.  Social distancing reinforced today.  Patient Risk: After full review of this patients clinical status, I feel that they are at moderate risk for cardiac decompensation at this time.  Relevant cardiac medications were reviewed at length with the patient today. The patient does not have concerns regarding their medications at this time.   The following changes were made today: Refill spironolactone 25 mg daily. Check BMET next week her PCP.  Recommended follow-up:  Virtual Visit 12  Weeks for virtual visit.    Today, I have spent 15 minutes with the patient with telehealth technology discussing the above issues .    Jeanmarie Hubert, NP  06/04/2019 10:06 AM  [place] [address] Heart and [place] [address] [phone] (office) [phone] (fax)

## 2019-06-04 NOTE — Addendum Note (Signed)
Encounter addended by: Harvie Junior, CMA on: 06/04/2019 10:22 AM  Actions taken: Order list changed, Clinical Note Signed

## 2019-06-09 DIAGNOSIS — R05 Cough: Secondary | ICD-10-CM | POA: Diagnosis not present

## 2019-06-09 DIAGNOSIS — J018 Other acute sinusitis: Secondary | ICD-10-CM | POA: Diagnosis not present

## 2019-06-15 DIAGNOSIS — I110 Hypertensive heart disease with heart failure: Secondary | ICD-10-CM | POA: Diagnosis not present

## 2019-06-15 DIAGNOSIS — E782 Mixed hyperlipidemia: Secondary | ICD-10-CM | POA: Diagnosis not present

## 2019-06-15 DIAGNOSIS — F5101 Primary insomnia: Secondary | ICD-10-CM | POA: Diagnosis not present

## 2019-06-15 DIAGNOSIS — E038 Other specified hypothyroidism: Secondary | ICD-10-CM | POA: Diagnosis not present

## 2019-06-15 DIAGNOSIS — E1142 Type 2 diabetes mellitus with diabetic polyneuropathy: Secondary | ICD-10-CM | POA: Diagnosis not present

## 2019-06-15 DIAGNOSIS — Z6841 Body Mass Index (BMI) 40.0 and over, adult: Secondary | ICD-10-CM | POA: Diagnosis not present

## 2019-06-15 DIAGNOSIS — I5022 Chronic systolic (congestive) heart failure: Secondary | ICD-10-CM | POA: Diagnosis not present

## 2019-06-18 ENCOUNTER — Telehealth (HOSPITAL_COMMUNITY): Payer: Self-pay | Admitting: Cardiology

## 2019-06-18 DIAGNOSIS — E782 Mixed hyperlipidemia: Secondary | ICD-10-CM

## 2019-06-18 NOTE — Telephone Encounter (Signed)
Abnormal labs received from PCP, labs drawn 06/16/19 BUN 35 Cr 1.49 Na140 K 4.5 Total cholesterol 228 LDL 154 HDL 51 Trig 127   Per Darrick Grinder, NP Refer to lipid clinic  Referral placed and patient aware

## 2019-06-24 ENCOUNTER — Other Ambulatory Visit (HOSPITAL_COMMUNITY): Payer: Self-pay | Admitting: Internal Medicine

## 2019-07-04 IMAGING — CT CT ANGIO CHEST
4 of 7 series · 18 of 46 positions shown · IV contrast (APPLIED)
Comparison: Chest two-view 02/11/2018, CTA chest 08/27/2016

CLINICAL DATA: Positive D-dimer short of breath. Rule out pulmonary
embolism

EXAM:
CT ANGIOGRAPHY CHEST WITH CONTRAST
TECHNIQUE: Multidetector CT imaging of the chest was performed using the
standard protocol during bolus administration of intravenous
contrast. Multiplanar CT image reconstructions and MIPs were
obtained to evaluate the vascular anatomy.
CONTRAST:  100mL [id number] IOPAMIDOL ([id number]) INJECTION 76%

[Series 6: arterial · axial · arterial · 0.67mm/px · z∈[+1265,+1397]mm · 4 of 111 slices shown]
[im 23/111  lung]
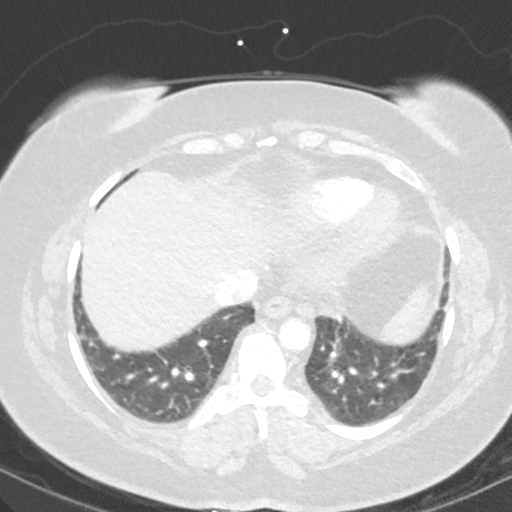
[im 45/111  soft-tissue]
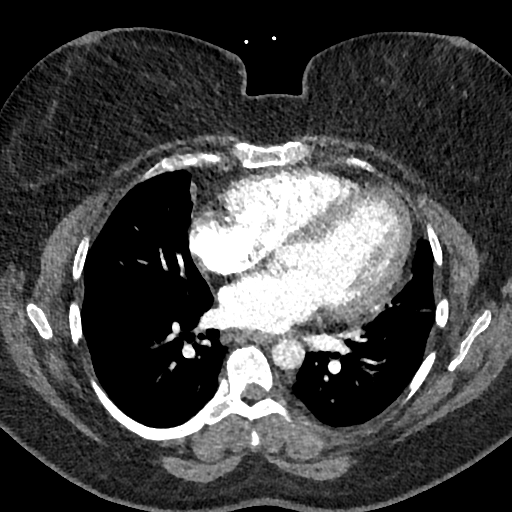
[im 67/111  lung]
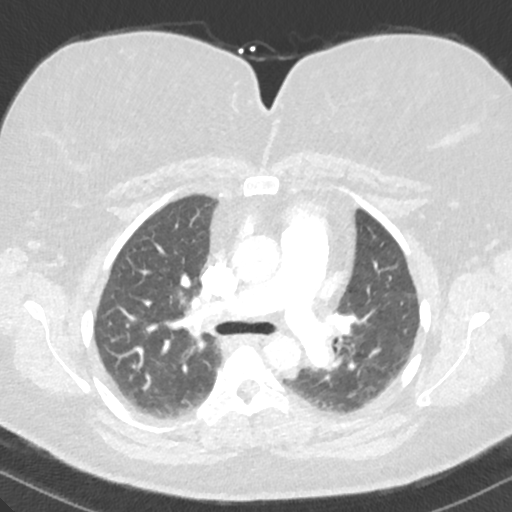
[im 89/111  soft-tissue]
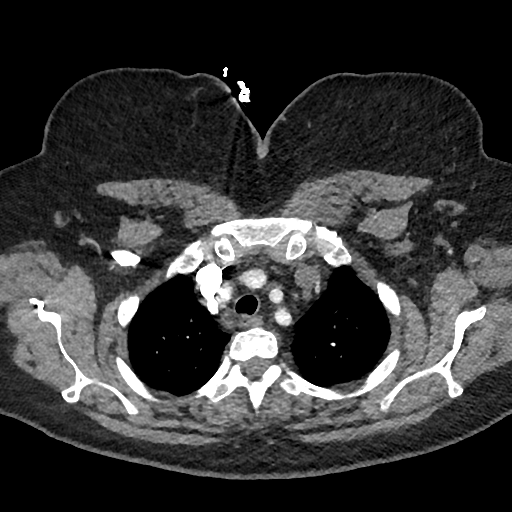

[Series 7: lung · axial · 0.67mm/px · z∈[+1257,+1331]mm · 3 of 111 slices shown]
[im 19/111  soft-tissue]
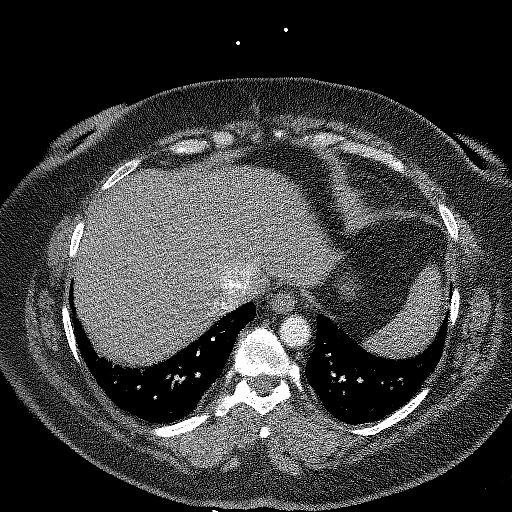
[im 37/111  soft-tissue]
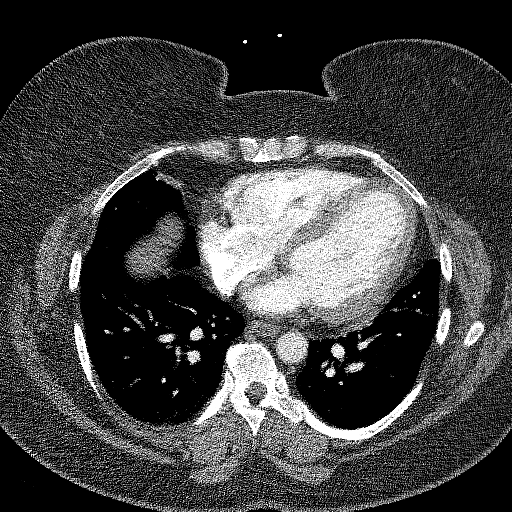
[im 56/111  soft-tissue]
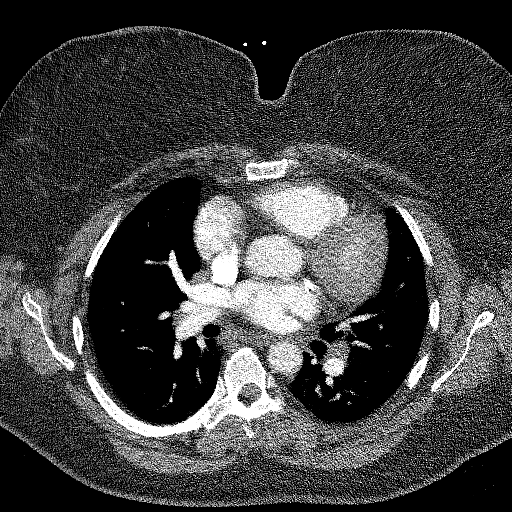

[Series 8: thins · axial · 0.67mm/px · z∈[+1245,+1416]mm · 8 of 315 slices shown]
[im 35/315  lung]
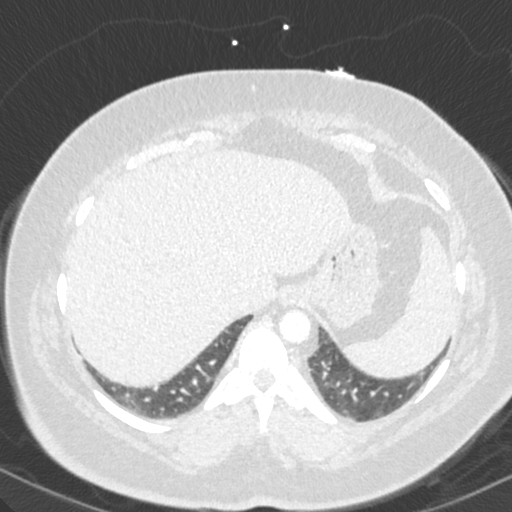
[im 70/315  lung]
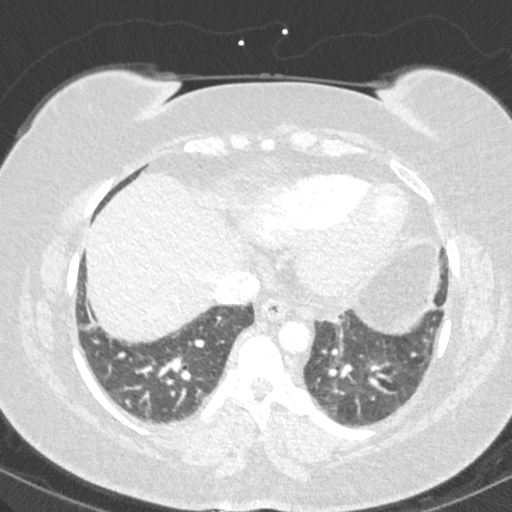
[im 105/315  lung]
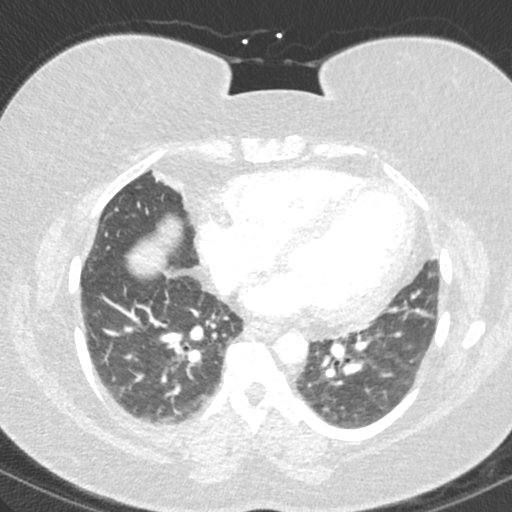
[im 140/315  lung]
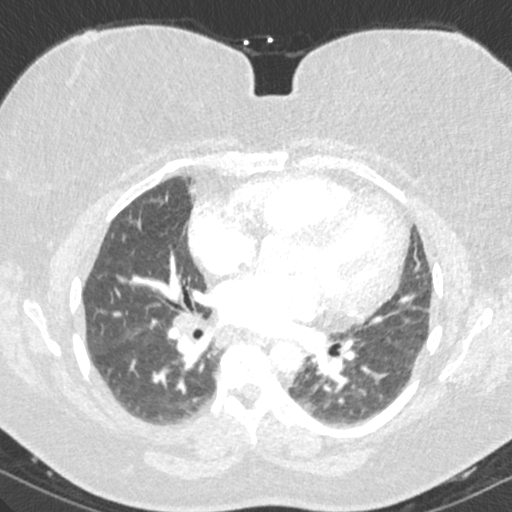
[im 175/315  lung]
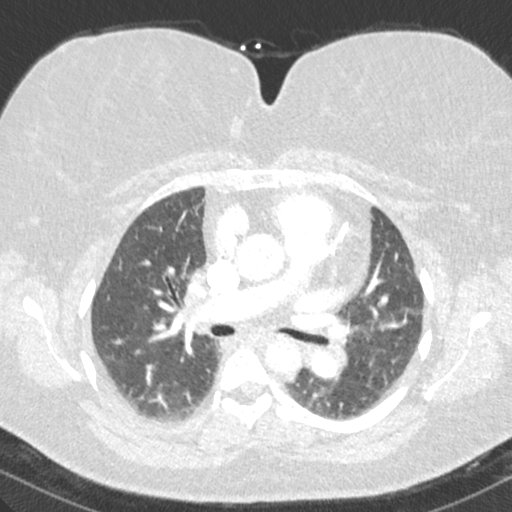
[im 210/315  lung]
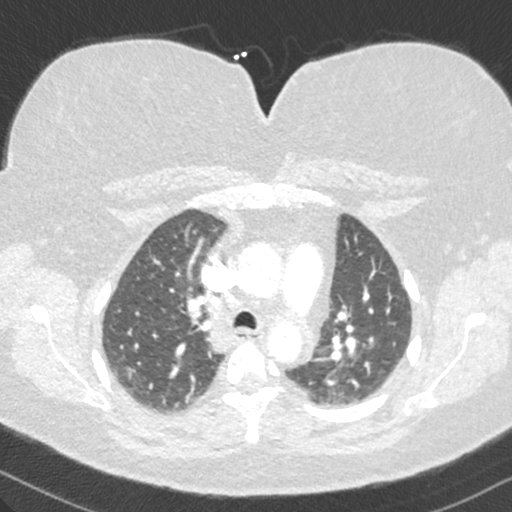
[im 245/315  lung]
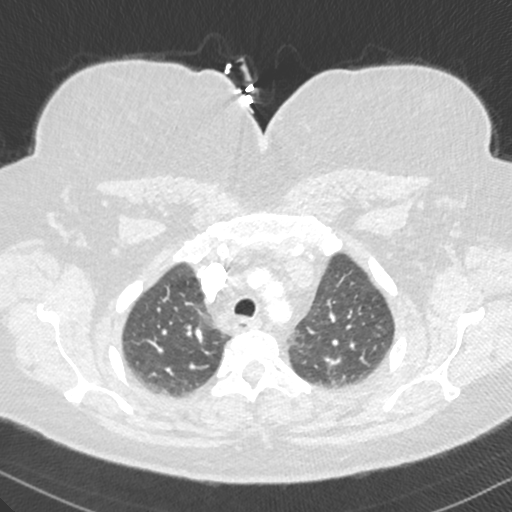
[im 280/315  lung]
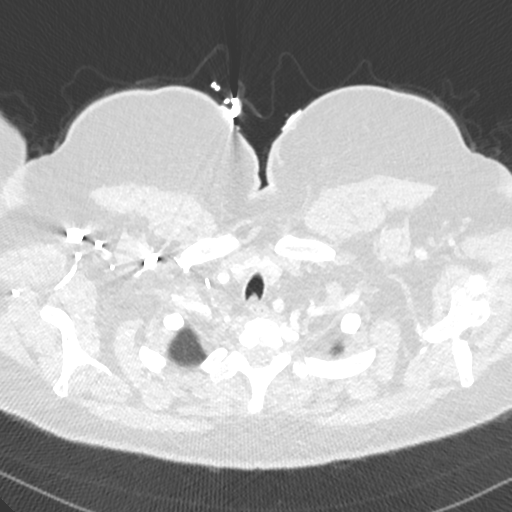

[Series 9: cor · coronal · 0.44mm/px · 3 of 151 slices shown]
[im 38/151  soft-tissue]
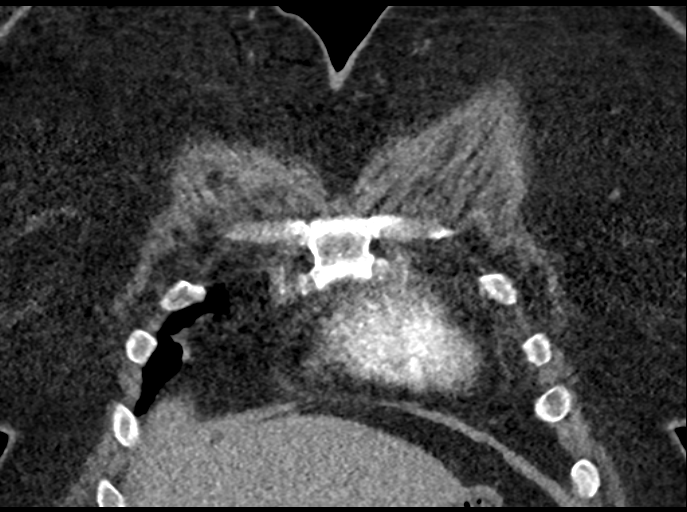
[im 76/151  soft-tissue]
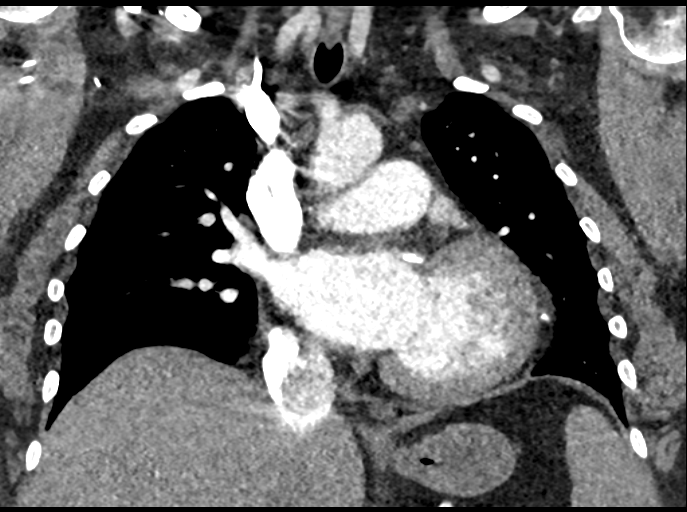
[im 113/151  soft-tissue]
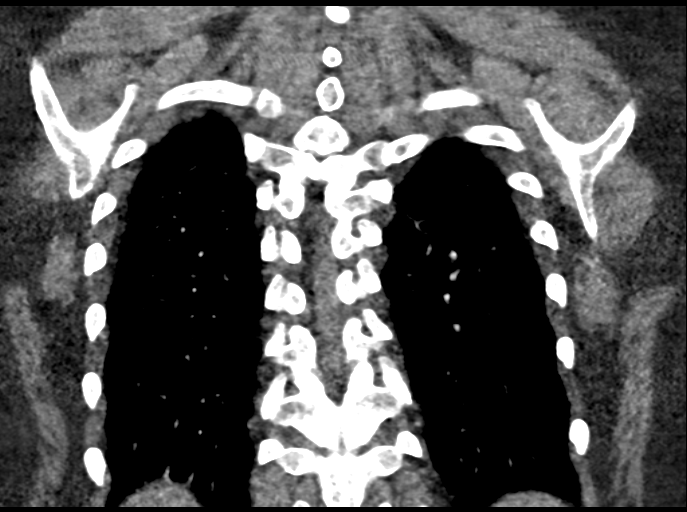

[18 of 46 positions shown; findings below may reference images not displayed]

FINDINGS: Cardiovascular: Negative for pulmonary embolism. Cardiac enlargement
with coronary artery calcification. Distal right coronary artery
stent. No pericardial effusion. Normal thoracic aorta.

Mediastinum/Nodes: Negative for mass or adenopathy. Normal
esophagus.

Lungs/Pleura: Mild right middle lobe atelectasis. Mild lingular
atelectasis. Negative for pneumonia. No mass or effusion.

Upper Abdomen: Negative

Musculoskeletal: No acute abnormality.

Review of the MIP images confirms the above findings.
IMPRESSION: Negative for pulmonary embolism.  No acute abnormality in the chest

Coronary artery disease.

## 2019-07-21 DIAGNOSIS — J4520 Mild intermittent asthma, uncomplicated: Secondary | ICD-10-CM | POA: Diagnosis not present

## 2019-07-21 DIAGNOSIS — J301 Allergic rhinitis due to pollen: Secondary | ICD-10-CM | POA: Diagnosis not present

## 2019-07-21 DIAGNOSIS — G4733 Obstructive sleep apnea (adult) (pediatric): Secondary | ICD-10-CM | POA: Diagnosis not present

## 2019-08-03 ENCOUNTER — Telehealth (HOSPITAL_COMMUNITY): Payer: Self-pay | Admitting: Adult Health

## 2019-08-03 NOTE — Telephone Encounter (Signed)
  Cardiomems elevated for the last 4-5 days.   Instructed to take metolazone today. Continue current diuretics regimen.   Dana [name] verbalized understanding.   Fabian Walder NP-C  11:15 AM

## 2019-08-10 ENCOUNTER — Other Ambulatory Visit (HOSPITAL_COMMUNITY): Payer: Self-pay

## 2019-08-10 MED ORDER — METOLAZONE 2.5 MG PO TABS: ORAL_TABLET | ORAL | 4 refills | Status: DC

## 2019-08-12 ENCOUNTER — Other Ambulatory Visit (HOSPITAL_COMMUNITY): Payer: Self-pay

## 2019-08-12 MED ORDER — METOLAZONE 2.5 MG PO TABS: ORAL_TABLET | ORAL | 4 refills | Status: DC

## 2019-08-14 ENCOUNTER — Other Ambulatory Visit (HOSPITAL_COMMUNITY): Payer: Self-pay | Admitting: *Deleted

## 2019-08-14 MED ORDER — METOLAZONE 2.5 MG PO TABS: ORAL_TABLET | ORAL | 4 refills | Status: DC

## 2019-08-27 ENCOUNTER — Other Ambulatory Visit: Payer: Self-pay | Admitting: Physician Assistant

## 2019-08-27 MED ORDER — PROMETHAZINE HCL 25 MG PO TABS: 25.0000 mg | ORAL_TABLET | Freq: Four times a day (QID) | ORAL | 1 refills | Status: DC | PRN

## 2019-09-03 ENCOUNTER — Telehealth (HOSPITAL_COMMUNITY): Payer: Self-pay | Admitting: Cardiology

## 2019-09-03 NOTE — Telephone Encounter (Signed)
Pt aware and voiced understanding 

## 2019-09-03 NOTE — Telephone Encounter (Signed)
-----   Message from Conrad Glenmora, NP sent at 09/03/2019 12:00 PM EST ----- Pls call  Cardiomems elevated. Instruct to take extra torsemide for the next 2 days   Thsk A

## 2019-09-07 ENCOUNTER — Ambulatory Visit (HOSPITAL_COMMUNITY)
Admission: RE | Admit: 2019-09-07 | Discharge: 2019-09-07 | Disposition: A | Discharge status: Home / Self Care | Payer: Medicare Other | Source: Ambulatory Visit | Attending: Adult Health | Admitting: Adult Health

## 2019-09-07 ENCOUNTER — Other Ambulatory Visit: Payer: Self-pay

## 2019-09-07 ENCOUNTER — Encounter (HOSPITAL_COMMUNITY): Payer: Self-pay

## 2019-09-07 VITALS — BP 169/68 | HR 89 | Wt 264.0 lb

## 2019-09-07 DIAGNOSIS — N183 Chronic kidney disease, stage 3 unspecified: Secondary | ICD-10-CM | POA: Insufficient documentation

## 2019-09-07 DIAGNOSIS — Z79899 Other long term (current) drug therapy: Secondary | ICD-10-CM | POA: Insufficient documentation

## 2019-09-07 DIAGNOSIS — Z794 Long term (current) use of insulin: Secondary | ICD-10-CM | POA: Diagnosis not present

## 2019-09-07 DIAGNOSIS — Z885 Allergy status to narcotic agent status: Secondary | ICD-10-CM | POA: Diagnosis not present

## 2019-09-07 DIAGNOSIS — I130 Hypertensive heart and chronic kidney disease with heart failure and stage 1 through stage 4 chronic kidney disease, or unspecified chronic kidney disease: Secondary | ICD-10-CM | POA: Diagnosis not present

## 2019-09-07 DIAGNOSIS — Z89412 Acquired absence of left great toe: Secondary | ICD-10-CM | POA: Insufficient documentation

## 2019-09-07 DIAGNOSIS — E1122 Type 2 diabetes mellitus with diabetic chronic kidney disease: Secondary | ICD-10-CM | POA: Insufficient documentation

## 2019-09-07 DIAGNOSIS — E669 Obesity, unspecified: Secondary | ICD-10-CM | POA: Diagnosis not present

## 2019-09-07 DIAGNOSIS — Z87891 Personal history of nicotine dependence: Secondary | ICD-10-CM | POA: Insufficient documentation

## 2019-09-07 DIAGNOSIS — Z6841 Body Mass Index (BMI) 40.0 and over, adult: Secondary | ICD-10-CM | POA: Insufficient documentation

## 2019-09-07 DIAGNOSIS — I480 Paroxysmal atrial fibrillation: Secondary | ICD-10-CM | POA: Diagnosis not present

## 2019-09-07 DIAGNOSIS — G4733 Obstructive sleep apnea (adult) (pediatric): Secondary | ICD-10-CM

## 2019-09-07 DIAGNOSIS — Z888 Allergy status to other drugs, medicaments and biological substances status: Secondary | ICD-10-CM | POA: Diagnosis not present

## 2019-09-07 DIAGNOSIS — Z8249 Family history of ischemic heart disease and other diseases of the circulatory system: Secondary | ICD-10-CM | POA: Diagnosis not present

## 2019-09-07 DIAGNOSIS — Z7902 Long term (current) use of antithrombotics/antiplatelets: Secondary | ICD-10-CM | POA: Diagnosis not present

## 2019-09-07 DIAGNOSIS — E785 Hyperlipidemia, unspecified: Secondary | ICD-10-CM | POA: Insufficient documentation

## 2019-09-07 DIAGNOSIS — E1140 Type 2 diabetes mellitus with diabetic neuropathy, unspecified: Secondary | ICD-10-CM | POA: Diagnosis not present

## 2019-09-07 DIAGNOSIS — Z89422 Acquired absence of other left toe(s): Secondary | ICD-10-CM | POA: Insufficient documentation

## 2019-09-07 DIAGNOSIS — Z886 Allergy status to analgesic agent status: Secondary | ICD-10-CM | POA: Diagnosis not present

## 2019-09-07 DIAGNOSIS — I5032 Chronic diastolic (congestive) heart failure: Secondary | ICD-10-CM | POA: Diagnosis not present

## 2019-09-07 DIAGNOSIS — Z955 Presence of coronary angioplasty implant and graft: Secondary | ICD-10-CM | POA: Insufficient documentation

## 2019-09-07 DIAGNOSIS — E1151 Type 2 diabetes mellitus with diabetic peripheral angiopathy without gangrene: Secondary | ICD-10-CM | POA: Diagnosis not present

## 2019-09-07 DIAGNOSIS — I2510 Atherosclerotic heart disease of native coronary artery without angina pectoris: Secondary | ICD-10-CM | POA: Diagnosis not present

## 2019-09-07 MED ORDER — SPIRONOLACTONE 25 MG PO TABS: 25.0000 mg | ORAL_TABLET | Freq: Every day | ORAL | 3 refills | Status: DC

## 2019-09-07 MED ORDER — NITROGLYCERIN 0.4 MG SL SUBL: 0.4000 mg | SUBLINGUAL_TABLET | SUBLINGUAL | 2 refills | Status: DC | PRN

## 2019-09-07 MED ORDER — ISOSORBIDE MONONITRATE ER 30 MG PO TB24: 30.0000 mg | ORAL_TABLET | Freq: Every day | ORAL | 3 refills | Status: DC

## 2019-09-07 NOTE — Progress Notes (Signed)
Heart Failure TeleHealth Note  Due to national recommendations of social distancing due to [place] 19, Audio/video telehealth visit is felt to be most appropriate for this patient at this time.  See MyChart message from today for patient consent regarding telehealth for [facility].  Date:  09/07/2019   ID:  Dana [name], DOB September 22, 1960, MRN [id number]  Location: Home  Provider location: [place] [facility] Failure Type of Visit: Established patient   PCP:  [name], MD  Cardiologist:  No primary care provider on file. Primary HF: Dr [name]   Chief Complaint:Heart Failure   History of Present Illness: Dana [name] is a 59 y.o. female with a history of obesity, diastolic heart failure, DMI, neuropathy, HTN, hyperlipidemia, CAD (most recent [facility] 08/2016 DES RCA and stent LAD total of 4 stents), anemia, OSA --> started CPAP 12/2016, PAF S/P ablation, PAD S/P L great toe and 4th toe amputation.   Admitted 4/24 - 11/11/17 with chest pain while getting up out of her recliner.  EKG unremarkable. CXR with pulmonary edema. Troponin  0.03 - 0.05. Diuresed with IV lasix with resolution of symptoms. Echo with normal EF as below. Torsemide cut back to 60 mg daily on discharge.   On June 25th she was evaluated in the ED for syncope. She was in her usual state with no complaints. She was standing at the sink and had high pitch sound in her ears. Her husband found her on kitchen floor. She was evaluated in ED and later sent home.   Zio patch 02/2018 with no arrhythmias, only occasional PACs/PVCs  Admitted in December 2019 to [facility] with increased sob. Diuresed with IV lasix and sent home  She presents via audioconferencing for a telehealth visit today. Overall feeling fine. Complaining pain in her back. Mild SOB with exertion. Denies PND/Orthopnea. Sleeps on 2 pillows chronically. Using CPAP every night. SBP at home 120-130. Appetite ok. No fever or chills. Weight at home 260-265   pounds. Taking all medications.     she denies symptoms worrisome for COVID 19.   Past Medical History:  Diagnosis Date  . Anginal pain ([place])   . CAD (coronary artery disease)    08/2016 DES to RCA, also with history of LAD stenting.   . Diabetes ([place])   . Diastolic CHF ([place])   . Hyperlipidemia   . Hypertension   . OSA (obstructive sleep apnea)    Past Surgical History:  Procedure Laterality Date  . CARPAL TUNNEL RELEASE    . CESAREAN SECTION    . CHOLECYSTECTOMY    . EYE SURGERY    . RIGHT/LEFT HEART CATH AND CORONARY ANGIOGRAPHY N/A 01/07/2017   Procedure: Right/Left Heart Cath and Coronary Angiography;  Surgeon: Larey Dresser, MD;  Location: [place] [facility];  Service: Cardiovascular;  Laterality: N/A;  . ROTATOR CUFF REPAIR    . TOE AMPUTATION Left      Current Outpatient Medications  Medication Sig Dispense Refill  . allopurinol (ZYLOPRIM) 100 MG tablet Take 2 tablets (200 mg total) by mouth daily. 180 tablet 3  . carvedilol (COREG) 6.25 MG tablet Take 1.5 tablets (9.375 mg total) by mouth 2 (two) times daily. 90 tablet 3  . clopidogrel (PLAVIX) 75 MG tablet Take 1 tablet (75 mg total) by mouth at bedtime. 90 tablet 3  . EPINEPHrine 0.3 mg/0.3 mL IJ SOAJ injection Use as directed for life-threatening allergic reaction. 4 Device 3  . famotidine (PEPCID) 20 MG tablet Take  1 tablet (20 mg total) by mouth daily. 30 tablet 5  . ferrous sulfate 325 (65 FE) MG tablet Take 325 mg by mouth 2 (two) times daily with a meal.    . hydrALAZINE (APRESOLINE) 50 MG tablet Take 1.5 tablets (75 mg total) by mouth 3 (three) times daily. 150 tablet 3  . HYDROcodone-acetaminophen (NORCO) 7.5-325 MG tablet Take 1 tablet by mouth 3 (three) times daily as needed for moderate pain.     Marland Kitchen insulin NPH Human (HUMULIN N,NOVOLIN N) 100 UNIT/ML injection Inject 30 Units into the skin 2 (two) times daily before a meal.     . insulin regular (NOVOLIN R,HUMULIN R) 100 units/mL injection Inject 7-12 Units  into the skin 3 (three) times daily before meals. Per sliding scale    . isosorbide mononitrate (IMDUR) 30 MG 24 hr tablet Take 1 tablet (30 mg total) by mouth daily. 90 tablet 3  . latanoprost (XALATAN) 0.005 % ophthalmic solution Place 1 drop into both eyes at bedtime.    Marland Kitchen LORazepam (ATIVAN) 0.5 MG tablet Take 0.5 mg by mouth daily as needed for anxiety.    . magnesium oxide (MAG-OX) 400 MG tablet Take 2 tablets (800 mg total) by mouth daily. Take 2 (400 mg) daily=800 mg 60 tablet 1  . metolazone (ZAROXOLYN) 2.5 MG tablet TAKE 1 TABLET BY MOUTH ONCE A WEEK. TAKE EVERY WEDNESDAY 10 tablet 4  . nitroGLYCERIN (NITROSTAT) 0.4 MG SL tablet Place 0.4 mg under the tongue every 5 (five) minutes as needed for chest pain.     . Omega-3 Fatty Acids (FISH OIL PO) Take 1 capsule by mouth daily.    . OXYGEN Inhale 2 L into the lungs at bedtime.    . pravastatin (PRAVACHOL) 40 MG tablet Take 80 mg by mouth at bedtime.     . Probiotic CAPS Take 1 capsule by mouth daily.    . promethazine (PHENERGAN) 25 MG tablet Take 1 tablet (25 mg total) by mouth every 6 (six) hours as needed for nausea or vomiting. 90 tablet 1  . spironolactone (ALDACTONE) 25 MG tablet Take 1 tablet (25 mg total) by mouth daily. 30 tablet 3  . torsemide (DEMADEX) 20 MG tablet Take 40 mg three times a day 180 tablet 3  . Evolocumab (REPATHA [place]) Inject into the skin.     No current facility-administered medications for this encounter.    Allergies:   Naproxen, Celecoxib, Aspirin, Glucosamine, Metoclopramide, Metolazone, Shellfish-derived products, and Tramadol   Social History:  The patient  reports that she has quit smoking. She has never used smokeless tobacco. She reports previous alcohol use. She reports previous drug use.   Family History:  The patient's family history includes Hypertension in her father.   ROS:  Please see the history of present illness.   All other systems are personally reviewed and negative.  Vitals:    09/07/19 1001  BP: (!) 169/68  Pulse: 89    Exam:  [place] Call; Exam is subjective and or/visual. General:  Speaks in full sentences. No resp difficulty. Lungs: Normal respiratory effort with conversation.  Abdomen: Non-distended per patient report Extremities: Pt denies edema. Neuro: Alert & oriented x 3.   Recent Labs: 12/15/2018: ALT 18; Hemoglobin 9.8; Magnesium 2.2; Platelets 348 05/05/2019: B Natriuretic Peptide 47.7; BUN 18; Creatinine, Ser 1.21; Potassium 4.6; Sodium 139  Personally reviewed   Wt Readings from Last 3 Encounters:  09/07/19 119.7 kg (264 lb)  06/04/19 118.4 kg (261 lb)  05/05/19  120.6 kg (265 lb 12.8 oz)      ASSESSMENT AND PLAN:  . Chronic Diastolic Heart Failure - Echo 5/19 LVEF 65-70%, Grade 2 DD - s/p Cardiomems device 03/11/18. Cardiomems reading 9 mm hg today, Goal 10.  NYHA III. Volume status stable.   - Continue torsemide 40 mg three times a day + metolazone 2.5 mg on Wednesday.  - Continue coreg to 9.375 mg twice a day.   - Continue  hydralazine 75 mg three times a day  - Continue  imdur 30  mg daily.  - Continue spiro 25 mg daily.  2. Obesity Body mass index is 44.62 kg/m. - Discussed portion control 3. OSA  Continue CPAP.  4. CAD - 4 stents. 08/2016  stent RCA.  - 2018 LHC nonobstructive. No chest pain. - Continue plavix, bb, and statin.  - Waiting on repatha approval. She has been referred to [place]  But she is concerned about cost.  5. PAF - S/P ablation 2016. - Stressed compliance with CPAP - Holter monitor 06/2017 with occasional PACs and SVT.  - BB as above.   7. HTN:  Elevated but normally SBP is 120-130. She attributes this to pain.  8. CKD III - Baseline creatinine appears to be 1.3 - 1.5 9. DMII On insulin.  Consider SGLT2I.   COVID screen The patient does not have any symptoms that suggest any further testing/ screening at this time.  Social distancing reinforced today.  Patient Risk:  After full review of this patients clinical status, I feel that they are at moderate risk for cardiac decompensation at this time.  Relevant cardiac medications were reviewed at length with the patient today. The patient does not have concerns regarding their medications at this time.   The following changes were made today: Refill spiro, imdur, and SL Nitro Recommended follow-up:  3-4 months with Dr [name] and an ECHO  Today, I have spent  minutes with the patient with telehealth technology discussing the above issues .    Jeanmarie Hubert, NP  09/07/2019 10:18 AM  [place] and [place] [address] [phone] (office) [phone] (fax)

## 2019-09-07 NOTE — Patient Instructions (Signed)
Your physician has requested that you have an echocardiogram. Echocardiography is a painless test that uses sound waves to create images of your heart. It provides your doctor with information about the size and shape of your heart and how well your heart's chambers and valves are working. This procedure takes approximately one hour. There are no restrictions for this procedure.    Please follow up with the [facility] in 3 months with an echocardiogram.  At the [facility], you and your health needs are our priority. As part of our continuing mission to provide you with exceptional heart care, we have created designated Provider Care Teams. These Care Teams include your primary Cardiologist (physician) and Advanced Practice Providers (APPs- Physician Assistants and Nurse Practitioners) who all work together to provide you with the care you need, when you need it.   You may see any of the following providers on your designated Care Team at your next follow up: Marland Kitchen Dr [name] . Dr Loralie Champagne . Darrick Grinder, NP . Lyda Jester, PA . Audry Riles, PharmD   Please be sure to bring in all your medications bottles to every appointment.

## 2019-09-07 NOTE — Progress Notes (Signed)
Refilled meds. Follow up sent to scheduler.

## 2019-09-07 NOTE — Addendum Note (Signed)
Encounter addended by: Marlise Eves, RN on: 09/07/2019 10:57 AM  Actions taken: Order list changed, Diagnosis association updated, Clinical Note Signed

## 2019-09-08 ENCOUNTER — Telehealth (HOSPITAL_COMMUNITY): Payer: Self-pay | Admitting: Vascular Surgery

## 2019-09-08 DIAGNOSIS — G4733 Obstructive sleep apnea (adult) (pediatric): Secondary | ICD-10-CM | POA: Diagnosis not present

## 2019-09-08 NOTE — Addendum Note (Signed)
Encounter addended by: Conrad Newtonia, NP on: 09/08/2019 11:21 AM  Actions taken: Charge Capture section accepted

## 2019-09-08 NOTE — Telephone Encounter (Signed)
Left pt message to make 3 month f/u w/ db w/ echo in May , asked pt o call back scheduling line to make appt

## 2019-09-14 ENCOUNTER — Other Ambulatory Visit: Payer: Self-pay

## 2019-09-14 MED ORDER — HYDROCODONE-ACETAMINOPHEN 7.5-325 MG PO TABS: 1.0000 | ORAL_TABLET | Freq: Three times a day (TID) | ORAL | 0 refills | Status: DC | PRN

## 2019-09-17 ENCOUNTER — Ambulatory Visit: Payer: Medicare Other | Admitting: Internal Medicine

## 2019-09-17 ENCOUNTER — Ambulatory Visit: Payer: Medicare HMO | Admitting: Family Medicine

## 2019-09-17 ENCOUNTER — Other Ambulatory Visit: Payer: Self-pay

## 2019-09-17 ENCOUNTER — Encounter: Payer: Self-pay | Admitting: Family Medicine

## 2019-09-17 ENCOUNTER — Encounter: Payer: Self-pay | Admitting: Internal Medicine

## 2019-09-17 VITALS — BP 150/68 | HR 65 | Temp 98.4°F | Ht 64.5 in | Wt 267.6 lb

## 2019-09-17 DIAGNOSIS — E782 Mixed hyperlipidemia: Secondary | ICD-10-CM | POA: Diagnosis not present

## 2019-09-17 DIAGNOSIS — I2510 Atherosclerotic heart disease of native coronary artery without angina pectoris: Secondary | ICD-10-CM

## 2019-09-17 DIAGNOSIS — I5032 Chronic diastolic (congestive) heart failure: Secondary | ICD-10-CM

## 2019-09-17 MED ORDER — EZETIMIBE 10 MG PO TABS: 10.0000 mg | ORAL_TABLET | Freq: Every day | ORAL | 3 refills | Status: DC

## 2019-09-17 MED ORDER — PRAVASTATIN SODIUM 40 MG PO TABS: 80.0000 mg | ORAL_TABLET | Freq: Every day | ORAL | 3 refills | Status: DC

## 2019-09-17 NOTE — Patient Instructions (Addendum)
Medication Instructions:  Restart Repatha, a nurse will be in contact with you soon.  *If you need a refill on your cardiac medications before your next appointment, please call your pharmacy*  Lab Work: Your physician recommends that you return for lab work in 3 months before your follow up appointment. (Fasting Lipids)  If you have labs (blood work) drawn today and your tests are completely normal, you will receive your results only by: Marland Kitchen MyChart Message (if you have MyChart) OR . A paper copy in the mail If you have any lab test that is abnormal or we need to change your treatment, we will call you to review the results.  Testing/Procedures: None  Follow-Up: At [facility], you and your health needs are our priority.  As part of our continuing mission to provide you with exceptional heart care, we have created designated Provider Care Teams.  These Care Teams include your primary Cardiologist (physician) and Advanced Practice Providers (APPs -  Physician Assistants and Nurse Practitioners) who all work together to provide you with the care you need, when you need it.  We recommend signing up for the patient portal called "MyChart".  Sign up information is provided on this After Visit Summary.  MyChart is used to connect with patients for Virtual Visits (Telemedicine).  Patients are able to view lab/test results, encounter notes, upcoming appointments, etc.  Non-urgent messages can be sent to your provider as well.   To learn more about what you can do with MyChart, go to [email or web address].    Your next appointment:   3 month(s)  The format for your next appointment:   In Person  Provider:   Raliegh Ip Mali Hilty, MD  Other Instructions: Dr. Debara Pickett recommends Repatha (PCSK9). This is an injectable cholesterol medication self-administered. This medication will need prior approval with your insurance company, which we will work on. If the medication is not approved initially, we may  need to do an appeal with your insurance. We will keep you updated on this process. You will need a red sharps container for proper disposal and your pharmacy can direct you on this.   If you need co-pay assistance, please look into the program at healthwellfoundation.org >> disease funds >> hypercholesterolemia. This is an online application or you can call to complete.   If you need a co-pay card for Repatha: [email or web address] >> paying for Repatha

## 2019-09-17 NOTE — Progress Notes (Signed)
LIPID CLINIC CONSULT NOTE  Chief Complaint:  Manage dyslipidemia  Primary Care Physician: [name], MD  Primary Cardiologist:  No primary care provider on file.  HPI:  Dana [name] is a 59 y.o. female who is being seen today for the evaluation of dyslipidemia at the request of Clegg, Amy D, NP.  Dana [name] is a complex 59 year old female with a history of obesity, diastolic heart failure, neuropathy, hypertension, dyslipidemia and coronary artery disease with 4 prior stents as well as PAF status post ablation and PAD with left toe amputation.  She presents for evaluation and management of dyslipidemia.  In August 2020 her total cholesterol was 195, triglycerides 136, HDL 47 and LDL 121.  Repeat labs in November showed the total cholesterol gone up to 228 with an LDL of 154.  She reports compliance with her medications including ezetimibe 10 mg daily and pravastatin 80 mg nightly.  Her target LDL is less than 70.  Deviously she had been approved for and started on Repatha which was arranged through her primary care physician Dr. [name].  She had a 6-month approval but started only in the second month and then subsequently required some sampling.  Ultimately she ran out of the medication and could not afford it and therefore has not been on it.  She was referred for definitive therapies.  PMHx:  Past Medical History:  Diagnosis Date  . Anginal pain ([place])   . CAD (coronary artery disease)    08/2016 DES to RCA, also with history of LAD stenting.   . Diabetes ([place])   . Diastolic CHF ([place])   . Hyperlipidemia   . Hypertension   . OSA (obstructive sleep apnea)     Past Surgical History:  Procedure Laterality Date  . CARPAL TUNNEL RELEASE    . CESAREAN SECTION    . CHOLECYSTECTOMY    . EYE SURGERY    . RIGHT/LEFT HEART CATH AND CORONARY ANGIOGRAPHY N/A 01/07/2017   Procedure: Right/Left Heart Cath and Coronary Angiography;  Surgeon: Larey Dresser, MD;  Location: [facility];  Service: Cardiovascular;  Laterality: N/A;  . ROTATOR CUFF REPAIR    . TOE AMPUTATION Left     FAMHx:  Family History  Problem Relation Age of Onset  . Hypertension Father     SOCHx:   reports that she has quit smoking. She has never used smokeless tobacco. She reports previous alcohol use. She reports previous drug use.  ALLERGIES:  Allergies  Allergen Reactions  . Naproxen Hives and Rash  . Celecoxib Other (See Comments)    Stomach pain  . Aspirin Other (See Comments)    CAN NOT TAKE DUE TO ULCERS  . Glucosamine Hives, Swelling and Other (See Comments)    Angioedema   . Metoclopramide Other (See Comments)    Facial twitching and stuttering  . Metolazone Other (See Comments)    Acute renal failure  . Shellfish-Derived Products Hives, Swelling and Other (See Comments)    Angioedema   . Tramadol Nausea And Vomiting         ROS: Pertinent items noted in HPI and remainder of comprehensive ROS otherwise negative.  HOME MEDS: Current Outpatient Medications on File Prior to Visit  Medication Sig Dispense Refill  . allopurinol (ZYLOPRIM) 100 MG tablet Take 2 tablets (200 mg total) by mouth daily. 180 tablet 3  . aspirin 81 MG chewable tablet Chew 81 mg by mouth daily.    . carvedilol (COREG) 6.25  MG tablet Take 1.5 tablets (9.375 mg total) by mouth 2 (two) times daily. 90 tablet 3  . clopidogrel (PLAVIX) 75 MG tablet Take 1 tablet (75 mg total) by mouth at bedtime. 90 tablet 3  . EPINEPHrine 0.3 mg/0.3 mL IJ SOAJ injection Use as directed for life-threatening allergic reaction. 4 Device 3  . Evolocumab (REPATHA [place]) Inject into the skin.    . famotidine (PEPCID) 20 MG tablet Take 1 tablet (20 mg total) by mouth daily. 30 tablet 5  . ferrous sulfate 325 (65 FE) MG tablet Take 325 mg by mouth 2 (two) times daily with a meal.    . hydrALAZINE (APRESOLINE) 50 MG tablet Take 1.5 tablets (75 mg total) by mouth 3 (three) times daily. 150 tablet 3  .  HYDROcodone-acetaminophen (NORCO) 7.5-325 MG tablet Take 1 tablet by mouth 3 (three) times daily as needed for moderate pain. 90 tablet 0  . insulin NPH Human (HUMULIN N,NOVOLIN N) 100 UNIT/ML injection Inject 30 Units into the skin 2 (two) times daily before a meal.     . insulin regular (NOVOLIN R,HUMULIN R) 100 units/mL injection Inject 7-12 Units into the skin 3 (three) times daily before meals. Per sliding scale    . isosorbide mononitrate (IMDUR) 30 MG 24 hr tablet Take 1 tablet (30 mg total) by mouth daily. 90 tablet 3  . latanoprost (XALATAN) 0.005 % ophthalmic solution Place 1 drop into both eyes at bedtime.    Marland Kitchen LORazepam (ATIVAN) 0.5 MG tablet Take 0.5 mg by mouth daily as needed for anxiety.    . magnesium oxide (MAG-OX) 400 MG tablet Take 2 tablets (800 mg total) by mouth daily. Take 2 (400 mg) daily=800 mg 60 tablet 1  . metolazone (ZAROXOLYN) 2.5 MG tablet TAKE 1 TABLET BY MOUTH ONCE A WEEK. TAKE EVERY WEDNESDAY 10 tablet 4  . nitroGLYCERIN (NITROSTAT) 0.4 MG SL tablet Place 1 tablet (0.4 mg total) under the tongue every 5 (five) minutes as needed for chest pain. 15 tablet 2  . Omega-3 Fatty Acids (FISH OIL PO) Take 1 capsule by mouth daily.    . OXYGEN Inhale 2 L into the lungs at bedtime.    . Probiotic CAPS Take 1 capsule by mouth daily.    . promethazine (PHENERGAN) 25 MG tablet Take 1 tablet (25 mg total) by mouth every 6 (six) hours as needed for nausea or vomiting. 90 tablet 1  . spironolactone (ALDACTONE) 25 MG tablet Take 1 tablet (25 mg total) by mouth daily. 30 tablet 3  . torsemide (DEMADEX) 20 MG tablet Take 40 mg three times a day 180 tablet 3   No current facility-administered medications on file prior to visit.    LABS/IMAGING: No results found for this or any previous visit (from the past 48 hour(s)). No results found.  LIPID PANEL:    Component Value Date/Time   CHOL 211 (H) 04/23/2018 0907   TRIG 118 04/23/2018 0907   HDL 49 04/23/2018 0907   CHOLHDL 4.3  04/23/2018 0907   CHOLHDL 4.6 11/07/2017 0445   VLDL 23 11/07/2017 0445   LDLCALC 138 (H) 04/23/2018 0907    WEIGHTS: Wt Readings from Last 3 Encounters:  09/17/19 267 lb 9.6 oz (121.4 kg)  09/07/19 264 lb (119.7 kg)  06/04/19 261 lb (118.4 kg)    VITALS: BP (!) 150/68   Pulse 65   Temp 98.4 F (36.9 C)   Ht 5' 4.5" (1.638 m)   Wt 267 lb 9.6 oz (121.4 kg)  SpO2 96%   BMI 45.22 kg/m   EXAM: Deferred  EKG: Deferred  ASSESSMENT: 1. Mixed dyslipidemia, goal LDL less than 70 2. Coronary artery disease with prior PCI 3. Chronic diastolic heart failure 4. PAF status post ablation 5. PAD 6. Type 2 diabetes with neuropathy  PLAN: 1.   Ms. Graylon Good has a mixed dyslipidemia and remains well above her target LDL goal.  She is a good candidate for PCSK9 inhibitor and previously had been approved for Repatha.  We will go ahead and try to reapprove the medication.  Cost may be an issue and we had referred her to the health well foundation.  If she is able to get the medication it affordable cost and use it we will plan to repeat her lipids in about 3 months.  Follow-up with me at that time.  Thanks again for the kind referral.  Pixie Casino, MD, FACC, [place] Director of the [facility] Lip[facility]  Cardiovascular Risk Reduction [facility] Diplomate of the American Board of Clinical Lipidology Attending Cardiologist  Direct Dial: [phone]  Fax: [phone]  Website:  [email or web address] [email or web address] 09/17/2019, 1:17 PM

## 2019-09-18 ENCOUNTER — Ambulatory Visit (INDEPENDENT_AMBULATORY_CARE_PROVIDER_SITE_OTHER): Payer: Medicare Other | Admitting: Family Medicine

## 2019-09-18 ENCOUNTER — Telehealth: Payer: Self-pay | Admitting: Internal Medicine

## 2019-09-18 ENCOUNTER — Encounter: Payer: Self-pay | Admitting: Family Medicine

## 2019-09-18 ENCOUNTER — Other Ambulatory Visit (HOSPITAL_COMMUNITY): Payer: Self-pay | Admitting: Cardiology

## 2019-09-18 VITALS — BP 136/64 | HR 88 | Temp 96.5°F | Ht 64.0 in | Wt 267.0 lb

## 2019-09-18 DIAGNOSIS — E1142 Type 2 diabetes mellitus with diabetic polyneuropathy: Secondary | ICD-10-CM

## 2019-09-18 DIAGNOSIS — I130 Hypertensive heart and chronic kidney disease with heart failure and stage 1 through stage 4 chronic kidney disease, or unspecified chronic kidney disease: Secondary | ICD-10-CM | POA: Diagnosis not present

## 2019-09-18 DIAGNOSIS — M545 Low back pain, unspecified: Secondary | ICD-10-CM

## 2019-09-18 DIAGNOSIS — J410 Simple chronic bronchitis: Secondary | ICD-10-CM

## 2019-09-18 DIAGNOSIS — F330 Major depressive disorder, recurrent, mild: Secondary | ICD-10-CM

## 2019-09-18 DIAGNOSIS — E1121 Type 2 diabetes mellitus with diabetic nephropathy: Secondary | ICD-10-CM

## 2019-09-18 DIAGNOSIS — I4819 Other persistent atrial fibrillation: Secondary | ICD-10-CM | POA: Diagnosis not present

## 2019-09-18 DIAGNOSIS — Z6841 Body Mass Index (BMI) 40.0 and over, adult: Secondary | ICD-10-CM

## 2019-09-18 DIAGNOSIS — G894 Chronic pain syndrome: Secondary | ICD-10-CM

## 2019-09-18 DIAGNOSIS — K219 Gastro-esophageal reflux disease without esophagitis: Secondary | ICD-10-CM

## 2019-09-18 MED ORDER — CLOPIDOGREL BISULFATE 75 MG PO TABS: 75.0000 mg | ORAL_TABLET | Freq: Every day | ORAL | 3 refills | Status: DC

## 2019-09-18 MED ORDER — ALLOPURINOL 100 MG PO TABS: 200.0000 mg | ORAL_TABLET | Freq: Every day | ORAL | 3 refills | Status: DC

## 2019-09-18 MED ORDER — REPATHA SURECLICK 140 MG/ML ~~LOC~~ SOAJ: 1.0000 | SUBCUTANEOUS | 11 refills | Status: DC

## 2019-09-18 MED ORDER — PRAVASTATIN SODIUM 40 MG PO TABS: 80.0000 mg | ORAL_TABLET | Freq: Every day | ORAL | 3 refills | Status: DC

## 2019-09-18 NOTE — Telephone Encounter (Signed)
PA for repatha submitted via CMM (Key: [id number])

## 2019-09-18 NOTE — Addendum Note (Signed)
Addended by: Fidel Levy on: 09/18/2019 12:36 PM   Modules accepted: Orders

## 2019-09-18 NOTE — Patient Instructions (Signed)
Well controlled.  ?No changes to medicines.  ?Continue to work on eating a healthy diet and exercise.  ?Labs drawn today.  ?

## 2019-09-18 NOTE — Progress Notes (Signed)
Subjective:  Patient ID: Dana [name], female    DOB: 1961/03/04  Age: 60 y.o. MRN: [id number]  Chief Complaint  Patient presents with  . Hyperlipidemia  . Hypertension  . Diabetes  . Gastroesophageal Reflux    HPI  Pt presents for follow up of hypertensive cardiovascular disease with combined CHF and CKD Stage 2, hyperlipidemia, diabetes, and GERD.  Her current cardiac medication regimen includes a diuretic (torsemide and spironolactone.), a beta-blocker (Carvedilol), Hydralazine, and plavix, baby aspirin, and repatha..  She is tolerating the medication well without side effects.  Compliance with treatment has been good; she takes her medication as directed, maintains her diet and exercise regimen, and follows up as directed.  She follows with Dr. Debara Pickett for cardiology.    Pt presents with hyperlipidemia.  Current treatment includes Pravachol, zetia, repatha and fish oil.  Compliance with treatment has been good; she takes her medication as directed, maintains her low cholesterol diet, follows up as directed, and maintains her exercise regimen.  She denies experiencing any hypercholesterolemia related symptoms.      Additionally, she presents with history of type 2 diabetes mellitus with diabetic polyneuropathy.  she has type 2, insulin requiring diabetes, complicated by nephropathy, retinopathy, and peripheral neuropathy.  Current meds include insulin ( NPH- 30 U twice a day; NOVOLIN R Regular U-100 SSI 6-10 U before meals ).  She reports home blood glucose readings of 80- 200. She checks her glucose 4 times per day.  Compliance with treatment has been good; she takes her medication as directed, maintains her diet and exercise regimen, follows up as directed, and is keeping a glucose diary.  Preventive care: She performs foot self-exams daily and has no sores.   Her last ophthalmology exam was in 03/2019 and the results showed proliferative retinopathy.  Current diabetes symptoms include  peripheral neuropathy (manifested as numbness ).  She denies polydipsia, polyphagia or polyuria.  She follows with Dr. Tamala Julian, her endocrinologist. Patient also suffers from gout and is taking allopurinol. She has chronic back pain for which she takes vicodin 7.5-325 mg one three tims a day.      Social History   Socioeconomic History  . Marital status: Married    Spouse name: Not on file  . Number of children: Not on file  . Years of education: Not on file  . Highest education level: Not on file  Occupational History  . Not on file  Tobacco Use  . Smoking status: Former Research scientist (life sciences)  . Smokeless tobacco: Never Used  Substance and Sexual Activity  . Alcohol use: Not Currently  . Drug use: Not Currently  . Sexual activity: Not on file  Other Topics Concern  . Not on file  Social History Narrative  . Not on file   Social Determinants of Health   Financial Resource Strain:   . Difficulty of Paying Living Expenses: Not on file  Food Insecurity:   . Worried About Charity fundraiser in the Last Year: Not on file  . Ran Out of Food in the Last Year: Not on file  Transportation Needs:   . Lack of Transportation (Medical): Not on file  . Lack of Transportation (Non-Medical): Not on file  Physical Activity:   . Days of Exercise per Week: Not on file  . Minutes of Exercise per Session: Not on file  Stress:   . Feeling of Stress : Not on file  Social Connections:   . Frequency of Communication with Friends and  Family: Not on file  . Frequency of Social Gatherings with Friends and Family: Not on file  . Attends Religious Services: Not on file  . Active Member of Clubs or Organizations: Not on file  . Attends Archivist Meetings: Not on file  . Marital Status: Not on file   Past Medical History:  Diagnosis Date  . Acquired absence of left great toe ([place])   . Acquired absence of other left toe(s) ([place])   . Anginal pain ([place])   . Atherosclerotic heart disease of native  coronary artery without angina pectoris   . CAD (coronary artery disease)    08/2016 DES to RCA, also with history of LAD stenting.   . Chronic diastolic (congestive) heart failure ([place])   . Chronic kidney disease, stage 2 (mild)   . Chronic systolic (congestive) heart failure ([place])   . Diabetes ([place])   . Diastolic CHF ([place])   . Gastro-esophageal reflux disease without esophagitis   . Hyperlipidemia   . Hypertension   . Hypertensive heart disease with heart failure ([place])   . Hypoxemia   . Idiopathic gout, multiple sites   . Localized edema   . Low back pain   . Mixed hyperlipidemia   . Mixed incontinence   . Morbid (severe) obesity due to excess calories ([place])   . OSA (obstructive sleep apnea)   . Other specified hypothyroidism   . Primary insomnia   . Type 2 diabetes mellitus with diabetic nephropathy (HCC)    Family History  Problem Relation Age of Onset  . Hypertension Father   . Coronary artery disease Mother   . Hypertension Mother   . Hyperlipidemia Mother   . Diabetes type II Mother   . Breast cancer Mother   . Lung cancer Mother   . Bipolar disorder Other   . Depression Other   . Schizophrenia Other     Review of Systems  Constitutional: Positive for fatigue. Negative for chills and fever.  HENT: Negative for congestion, ear pain and sore throat.   Respiratory: Positive for shortness of breath. Negative for cough.        With exertion  Cardiovascular: Negative for chest pain.  Gastrointestinal: Negative for abdominal pain, constipation, diarrhea, nausea and vomiting.  Endocrine: Negative for polydipsia, polyphagia and polyuria.  Genitourinary: Negative for dysuria and urgency.  Musculoskeletal: Positive for back pain. Negative for myalgias.  Neurological: Negative for dizziness and headaches.       BL peripheral neuropathy causes pain.  Psychiatric/Behavioral: Negative for dysphoric mood. The patient is not nervous/anxious.      Objective:  BP 136/64 (BP  Location: Left Arm, Patient Position: Sitting)   Pulse 88   Temp (!) 96.5 F (35.8 C) (Temporal)   Ht 5\' 4"  (1.626 m)   Wt 267 lb (121.1 kg)   SpO2 95%   BMI 45.83 kg/m   BP/Weight 09/18/2019 09/17/2019 0/94/7096  Systolic BP 283 662 947  Diastolic BP 64 68 68  Wt. (Lbs) 267 267.6 264  BMI 45.83 45.22 44.62    Physical Exam Vitals reviewed.  Constitutional:      Appearance: Normal appearance. She is normal weight.  Cardiovascular:     Rate and Rhythm: Normal rate. Rhythm irregular.     Heart sounds: Normal heart sounds.  Pulmonary:     Effort: Pulmonary effort is normal. No respiratory distress.     Breath sounds: Normal breath sounds.  Abdominal:     General: Abdomen is flat. Bowel sounds  are normal.     Palpations: Abdomen is soft.     Tenderness: There is no abdominal tenderness.  Neurological:     Mental Status: She is alert and oriented to person, place, and time.  Psychiatric:        Mood and Affect: Mood normal.        Behavior: Behavior normal.   Patient scheduled to see Dr. Tamala Julian will leave foot exam to him.   Lab Results  Component Value Date   WBC 11.6 (H) 12/15/2018   HGB 9.8 (L) 12/15/2018   HCT 32.1 (L) 12/15/2018   PLT 348 12/15/2018   GLUCOSE 121 (H) 05/05/2019   CHOL 211 (H) 04/23/2018   TRIG 118 04/23/2018   HDL 49 04/23/2018   LDLCALC 138 (H) 04/23/2018   ALT 18 12/15/2018   AST 16 12/15/2018   NA 139 05/05/2019   K 4.6 05/05/2019   CL 106 05/05/2019   CREATININE 1.21 (H) 05/05/2019   BUN 18 05/05/2019   CO2 23 05/05/2019   INR 1.08 01/01/2017      Assessment & Plan:  1. Diabetic polyneuropathy associated with type 2 diabetes mellitus (HCC) Continue current medications.  Fairly well controlled.  Defer management to Dr. Tamala Julian.  Continue to work on diet and exercise as patient is significantly obese. - Lipid panel  2. Hypertensive heart and renal disease with congestive heart failure ([place]) Fairly well controlled.  Sees Dr. Debara Pickett and  Dr. Duwaine Maxin. Continue to work on a low-fat and low-salt diet. - CBC with Differential/Platelet - Comp. Metabolic Panel (12)  3. GERD without esophagitis Well-controlled on Pepcid.   4. Simple chronic bronchitis ([place]) Although patient has a history of this she has no significant issues other than occasionally needing Proventil at the time she has a bronchitis flare. 5. Persistent atrial fibrillation ([place]) Follows with cardiology.  Patient has been on NOAC's previously however has had anemia so believe this is the reason this was discontinued.  Again defer to cardiology for management.  6. Morbid obesity with BMI of 45.0-49.9, adult ([place]) Poorly controlled.  Recommend strict diet and exercise.  I would like the patient to lose 15 pounds by her next visit in 3 months. 7.  Chronic pain syndrome secondary to lumbar back pain.  Stable on hydrocodone.  Not a good candidate for any surgeries. 8.  Diabetic retinopathy, diabetic nephropathy.  Follows with eye doctor annually.  Patient is intolerant to ACE inhibitors and ARB's. 9.  Depression, mild, recurrent.  Currently well controlled off antidepressants.  She does have lorazepam that she takes for severe anxiety.   Meds ordered this encounter  Medications  . pravastatin (PRAVACHOL) 40 MG tablet    Sig: Take 2 tablets (80 mg total) by mouth at bedtime.    Dispense:  180 tablet    Refill:  3       Follow-up: No follow-ups on file.  AVS was given to patient prior to departure.  [name] [facility] [phone]

## 2019-09-18 NOTE — Telephone Encounter (Signed)
Request Reference Number: [id number]. REPATHA SURE INJ 140MG /ML is approved through 03/20/2020. Your patient may now fill this prescription and it will be covered.

## 2019-09-19 LAB — CBC WITH DIFFERENTIAL/PLATELET
Basophils Absolute: 0.1 10*3/uL (ref 0.0–0.2)
Basos: 1 %
EOS (ABSOLUTE): 0.3 10*3/uL (ref 0.0–0.4)
Eos: 2 %
Hematocrit: 29.1 % — ABNORMAL LOW (ref 34.0–46.6)
Hemoglobin: 9.1 g/dL — ABNORMAL LOW (ref 11.1–15.9)
Immature Grans (Abs): 0 10*3/uL (ref 0.0–0.1)
Immature Granulocytes: 0 %
Lymphocytes Absolute: 2.2 10*3/uL (ref 0.7–3.1)
Lymphs: 19 %
MCH: 26.3 pg — ABNORMAL LOW (ref 26.6–33.0)
MCHC: 31.3 g/dL — ABNORMAL LOW (ref 31.5–35.7)
MCV: 84 fL (ref 79–97)
Monocytes Absolute: 0.7 10*3/uL (ref 0.1–0.9)
Monocytes: 6 %
Neutrophils Absolute: 8.4 10*3/uL — ABNORMAL HIGH (ref 1.4–7.0)
Neutrophils: 72 %
Platelets: 348 10*3/uL (ref 150–450)
RBC: 3.46 x10E6/uL — ABNORMAL LOW (ref 3.77–5.28)
RDW: 15 % (ref 11.7–15.4)
WBC: 11.6 10*3/uL — ABNORMAL HIGH (ref 3.4–10.8)

## 2019-09-19 LAB — LIPID PANEL
Chol/HDL Ratio: 4.6 ratio — ABNORMAL HIGH (ref 0.0–4.4)
Cholesterol, Total: 205 mg/dL — ABNORMAL HIGH (ref 100–199)
HDL: 45 mg/dL (ref >39)
LDL Chol Calc (NIH): 130 mg/dL — ABNORMAL HIGH (ref 0–99)
Triglycerides: 168 mg/dL — ABNORMAL HIGH (ref 0–149)
VLDL Cholesterol Cal: 30 mg/dL (ref 5–40)

## 2019-09-19 LAB — COMP. METABOLIC PANEL (12)
AST: 15 IU/L (ref 0–40)
Albumin/Globulin Ratio: 1.3 (ref 1.2–2.2)
Albumin: 3.9 g/dL (ref 3.8–4.9)
Alkaline Phosphatase: 162 IU/L — ABNORMAL HIGH (ref 39–117)
BUN/Creatinine Ratio: 14 (ref 9–23)
BUN: 20 mg/dL (ref 6–24)
Bilirubin Total: <0.2 mg/dL (ref 0.0–1.2)
Calcium: 8.9 mg/dL (ref 8.7–10.2)
Chloride: 101 mmol/L (ref 96–106)
Creatinine, Ser: 1.42 mg/dL — ABNORMAL HIGH (ref 0.57–1.00)
GFR calc Af Amer: 48 mL/min/{1.73_m2} — ABNORMAL LOW (ref >59)
GFR calc non Af Amer: 41 mL/min/{1.73_m2} — ABNORMAL LOW (ref >59)
Globulin, Total: 3 g/dL (ref 1.5–4.5)
Glucose: 100 mg/dL — ABNORMAL HIGH (ref 65–99)
Potassium: 4.6 mmol/L (ref 3.5–5.2)
Sodium: 139 mmol/L (ref 134–144)
Total Protein: 6.9 g/dL (ref 6.0–8.5)

## 2019-09-19 LAB — CARDIOVASCULAR RISK ASSESSMENT

## 2019-09-22 DIAGNOSIS — R309 Painful micturition, unspecified: Secondary | ICD-10-CM | POA: Diagnosis not present

## 2019-09-22 DIAGNOSIS — R809 Proteinuria, unspecified: Secondary | ICD-10-CM | POA: Diagnosis not present

## 2019-09-22 DIAGNOSIS — I10 Essential (primary) hypertension: Secondary | ICD-10-CM | POA: Diagnosis not present

## 2019-09-22 DIAGNOSIS — I509 Heart failure, unspecified: Secondary | ICD-10-CM | POA: Diagnosis not present

## 2019-09-22 DIAGNOSIS — N183 Chronic kidney disease, stage 3 unspecified: Secondary | ICD-10-CM | POA: Diagnosis not present

## 2019-09-23 DIAGNOSIS — H40053 Ocular hypertension, bilateral: Secondary | ICD-10-CM | POA: Diagnosis not present

## 2019-09-25 DIAGNOSIS — Z1231 Encounter for screening mammogram for malignant neoplasm of breast: Secondary | ICD-10-CM | POA: Diagnosis not present

## 2019-09-25 LAB — HM MAMMOGRAPHY

## 2019-10-01 ENCOUNTER — Telehealth (HOSPITAL_COMMUNITY): Payer: Self-pay | Admitting: Adult Health

## 2019-10-01 NOTE — Telephone Encounter (Signed)
Called and instructed to increase torsemide to 60 mg three times a day 2 days.   She verbalized understanding.   Daizee Firmin NP-C  4:58 PM

## 2019-10-05 ENCOUNTER — Encounter: Payer: Self-pay | Admitting: Family Medicine

## 2019-10-05 DIAGNOSIS — M545 Low back pain, unspecified: Secondary | ICD-10-CM | POA: Insufficient documentation

## 2019-10-05 DIAGNOSIS — E1121 Type 2 diabetes mellitus with diabetic nephropathy: Secondary | ICD-10-CM | POA: Insufficient documentation

## 2019-10-05 DIAGNOSIS — F330 Major depressive disorder, recurrent, mild: Secondary | ICD-10-CM | POA: Insufficient documentation

## 2019-10-13 ENCOUNTER — Other Ambulatory Visit: Payer: Self-pay

## 2019-10-13 DIAGNOSIS — I10 Essential (primary) hypertension: Secondary | ICD-10-CM | POA: Diagnosis not present

## 2019-10-13 DIAGNOSIS — E782 Mixed hyperlipidemia: Secondary | ICD-10-CM | POA: Diagnosis not present

## 2019-10-13 DIAGNOSIS — E1042 Type 1 diabetes mellitus with diabetic polyneuropathy: Secondary | ICD-10-CM | POA: Diagnosis not present

## 2019-10-13 DIAGNOSIS — E1065 Type 1 diabetes mellitus with hyperglycemia: Secondary | ICD-10-CM | POA: Diagnosis not present

## 2019-10-13 MED ORDER — LORAZEPAM 0.5 MG PO TABS: 0.5000 mg | ORAL_TABLET | Freq: Every day | ORAL | 0 refills | Status: DC | PRN

## 2019-10-13 MED ORDER — HYDROCODONE-ACETAMINOPHEN 7.5-325 MG PO TABS: 1.0000 | ORAL_TABLET | Freq: Three times a day (TID) | ORAL | 0 refills | Status: DC | PRN

## 2019-10-19 ENCOUNTER — Other Ambulatory Visit: Payer: Self-pay

## 2019-10-19 ENCOUNTER — Ambulatory Visit (HOSPITAL_COMMUNITY)
Admission: RE | Admit: 2019-10-19 | Discharge: 2019-10-19 | Disposition: A | Discharge status: Home / Self Care | Payer: Medicare Other | Source: Ambulatory Visit | Attending: Cardiology | Admitting: Cardiology

## 2019-10-19 NOTE — Progress Notes (Signed)
    Cardiomems Update   Implant Date 03/11/2018    RHC 03/12/2018  Fick Cardiac Output 8.3 L/min  Fick Cardiac Output Index 3.88 (L/min)/BSA  RA A Wave 14 mmHg  RA V Wave 13 mmHg  RA Mean 10 mmHg  RV Systolic Pressure 49 mmHg  RV Diastolic Pressure 3 mmHg  RV EDP 13 mmHg  PA Systolic Pressure 50 mmHg  PA Diastolic Pressure 19 mmHg  PA Mean 31 mmHg  PW A Wave 18 mmHg  PW V Wave 25 mmHg  PW Mean 16 mmHg  QP/QS 1  TPVR Index 7.99 HRUI       Recent BMET 09/18/19 Creatinine 1.42  BUN 20  Potassium 4.6   PAD Range 8-12 Current PAD 9 mmHg      Recommendations  I have reviewed the patients PA monitoring at least weekly to bring PA pressures within optimal range.   No change today.   Cord Wilczynski NP-C  9:57 AM

## 2019-10-30 ENCOUNTER — Telehealth: Payer: Self-pay | Admitting: Internal Medicine

## 2019-10-30 DIAGNOSIS — E782 Mixed hyperlipidemia: Secondary | ICD-10-CM

## 2019-10-30 DIAGNOSIS — I2510 Atherosclerotic heart disease of native coronary artery without angina pectoris: Secondary | ICD-10-CM

## 2019-10-30 NOTE — Telephone Encounter (Signed)
Lipid panel ordered to be completed prior to next visit with Dr. Debara Pickett Reminder sent to patient via [place]

## 2019-11-05 DIAGNOSIS — J4520 Mild intermittent asthma, uncomplicated: Secondary | ICD-10-CM | POA: Diagnosis not present

## 2019-11-05 DIAGNOSIS — G4733 Obstructive sleep apnea (adult) (pediatric): Secondary | ICD-10-CM | POA: Diagnosis not present

## 2019-11-05 DIAGNOSIS — J301 Allergic rhinitis due to pollen: Secondary | ICD-10-CM | POA: Diagnosis not present

## 2019-11-12 ENCOUNTER — Other Ambulatory Visit: Payer: Self-pay

## 2019-11-12 MED ORDER — HYDROCODONE-ACETAMINOPHEN 7.5-325 MG PO TABS: 1.0000 | ORAL_TABLET | Freq: Three times a day (TID) | ORAL | 0 refills | Status: DC | PRN

## 2019-11-16 ENCOUNTER — Ambulatory Visit (HOSPITAL_COMMUNITY)
Admission: RE | Admit: 2019-11-16 | Discharge: 2019-11-16 | Disposition: A | Discharge status: Home / Self Care | Payer: Medicare Other | Source: Ambulatory Visit | Attending: Internal Medicine | Admitting: Internal Medicine

## 2019-11-16 DIAGNOSIS — I5032 Chronic diastolic (congestive) heart failure: Secondary | ICD-10-CM | POA: Diagnosis not present

## 2019-11-16 NOTE — Progress Notes (Signed)
   Cardiomems Update   Implant Date 03/13/2018  RHC RA 10  PA 50/19  PCWP 16 CO 8.3 CI 3.88   09/18/19  Creatinine 1.42 BUN 20  Potassium 4.6   PAD Goal 10 mm Hg  Current PAD 13 mm Hg   Recommendations  I have reviewed the patients PA monitoring at least weekly to bring PA pressures within optimal range.   I have reviewed current PAD from cardiomems device. PAD reading stable.  Continue current diuretic regimen.  Follow monthly.   Amy Clegg NP-C  11:24 AM

## 2019-11-16 NOTE — Addendum Note (Signed)
Encounter addended by: Kerry Dory, CMA on: 11/16/2019 1:41 PM  Actions taken: Clinical Note Signed

## 2019-11-24 ENCOUNTER — Telehealth (HOSPITAL_COMMUNITY): Payer: Self-pay | Admitting: Adult Health

## 2019-11-24 NOTE — Telephone Encounter (Signed)
Cardiomems reading elevated today   Instructed to take an extra 20 mg torsemide for 2 days .  Dana [name] NP_C  4:21 PM

## 2019-11-25 ENCOUNTER — Other Ambulatory Visit: Payer: Self-pay

## 2019-11-26 MED ORDER — LORAZEPAM 0.5 MG PO TABS: 0.5000 mg | ORAL_TABLET | Freq: Every day | ORAL | 2 refills | Status: DC | PRN

## 2019-12-08 ENCOUNTER — Other Ambulatory Visit: Payer: Self-pay

## 2019-12-08 ENCOUNTER — Ambulatory Visit (HOSPITAL_COMMUNITY)
Admission: RE | Admit: 2019-12-08 | Discharge: 2019-12-08 | Disposition: A | Discharge status: Home / Self Care | Payer: Medicare Other | Source: Ambulatory Visit | Attending: Adult Health | Admitting: Adult Health

## 2019-12-08 ENCOUNTER — Encounter (HOSPITAL_COMMUNITY): Payer: Self-pay | Admitting: Internal Medicine

## 2019-12-08 ENCOUNTER — Ambulatory Visit (HOSPITAL_BASED_OUTPATIENT_CLINIC_OR_DEPARTMENT_OTHER)
Admission: RE | Admit: 2019-12-08 | Discharge: 2019-12-08 | Disposition: A | Discharge status: Home / Self Care | Payer: Medicare Other | Source: Ambulatory Visit | Attending: Internal Medicine | Admitting: Internal Medicine

## 2019-12-08 VITALS — BP 180/52 | HR 79 | Wt 262.4 lb

## 2019-12-08 DIAGNOSIS — I5032 Chronic diastolic (congestive) heart failure: Secondary | ICD-10-CM

## 2019-12-08 DIAGNOSIS — G4733 Obstructive sleep apnea (adult) (pediatric): Secondary | ICD-10-CM | POA: Insufficient documentation

## 2019-12-08 DIAGNOSIS — N1831 Chronic kidney disease, stage 3a: Secondary | ICD-10-CM

## 2019-12-08 DIAGNOSIS — Z886 Allergy status to analgesic agent status: Secondary | ICD-10-CM | POA: Insufficient documentation

## 2019-12-08 DIAGNOSIS — Z794 Long term (current) use of insulin: Secondary | ICD-10-CM | POA: Insufficient documentation

## 2019-12-08 DIAGNOSIS — I2510 Atherosclerotic heart disease of native coronary artery without angina pectoris: Secondary | ICD-10-CM | POA: Diagnosis not present

## 2019-12-08 DIAGNOSIS — Z888 Allergy status to other drugs, medicaments and biological substances status: Secondary | ICD-10-CM | POA: Insufficient documentation

## 2019-12-08 DIAGNOSIS — I10 Essential (primary) hypertension: Secondary | ICD-10-CM

## 2019-12-08 DIAGNOSIS — Z833 Family history of diabetes mellitus: Secondary | ICD-10-CM | POA: Insufficient documentation

## 2019-12-08 DIAGNOSIS — E038 Other specified hypothyroidism: Secondary | ICD-10-CM | POA: Insufficient documentation

## 2019-12-08 DIAGNOSIS — I471 Supraventricular tachycardia: Secondary | ICD-10-CM | POA: Diagnosis not present

## 2019-12-08 DIAGNOSIS — Z7982 Long term (current) use of aspirin: Secondary | ICD-10-CM | POA: Diagnosis not present

## 2019-12-08 DIAGNOSIS — Z89422 Acquired absence of other left toe(s): Secondary | ICD-10-CM | POA: Insufficient documentation

## 2019-12-08 DIAGNOSIS — E1051 Type 1 diabetes mellitus with diabetic peripheral angiopathy without gangrene: Secondary | ICD-10-CM | POA: Diagnosis not present

## 2019-12-08 DIAGNOSIS — K219 Gastro-esophageal reflux disease without esophagitis: Secondary | ICD-10-CM | POA: Insufficient documentation

## 2019-12-08 DIAGNOSIS — I491 Atrial premature depolarization: Secondary | ICD-10-CM | POA: Diagnosis not present

## 2019-12-08 DIAGNOSIS — I130 Hypertensive heart and chronic kidney disease with heart failure and stage 1 through stage 4 chronic kidney disease, or unspecified chronic kidney disease: Secondary | ICD-10-CM | POA: Insufficient documentation

## 2019-12-08 DIAGNOSIS — I480 Paroxysmal atrial fibrillation: Secondary | ICD-10-CM | POA: Insufficient documentation

## 2019-12-08 DIAGNOSIS — N183 Chronic kidney disease, stage 3 unspecified: Secondary | ICD-10-CM

## 2019-12-08 DIAGNOSIS — Z6841 Body Mass Index (BMI) 40.0 and over, adult: Secondary | ICD-10-CM | POA: Insufficient documentation

## 2019-12-08 DIAGNOSIS — Z8249 Family history of ischemic heart disease and other diseases of the circulatory system: Secondary | ICD-10-CM | POA: Insufficient documentation

## 2019-12-08 DIAGNOSIS — Z79899 Other long term (current) drug therapy: Secondary | ICD-10-CM | POA: Diagnosis not present

## 2019-12-08 DIAGNOSIS — Z7902 Long term (current) use of antithrombotics/antiplatelets: Secondary | ICD-10-CM | POA: Insufficient documentation

## 2019-12-08 DIAGNOSIS — Z89412 Acquired absence of left great toe: Secondary | ICD-10-CM | POA: Diagnosis not present

## 2019-12-08 DIAGNOSIS — Z955 Presence of coronary angioplasty implant and graft: Secondary | ICD-10-CM | POA: Diagnosis not present

## 2019-12-08 DIAGNOSIS — E782 Mixed hyperlipidemia: Secondary | ICD-10-CM

## 2019-12-08 DIAGNOSIS — E1022 Type 1 diabetes mellitus with diabetic chronic kidney disease: Secondary | ICD-10-CM | POA: Insufficient documentation

## 2019-12-08 DIAGNOSIS — Z87891 Personal history of nicotine dependence: Secondary | ICD-10-CM | POA: Insufficient documentation

## 2019-12-08 DIAGNOSIS — Z8349 Family history of other endocrine, nutritional and metabolic diseases: Secondary | ICD-10-CM | POA: Insufficient documentation

## 2019-12-08 DIAGNOSIS — E1040 Type 1 diabetes mellitus with diabetic neuropathy, unspecified: Secondary | ICD-10-CM | POA: Diagnosis not present

## 2019-12-08 DIAGNOSIS — Z885 Allergy status to narcotic agent status: Secondary | ICD-10-CM | POA: Insufficient documentation

## 2019-12-08 MED ORDER — HYDRALAZINE HCL 100 MG PO TABS: 100.0000 mg | ORAL_TABLET | Freq: Three times a day (TID) | ORAL | 3 refills | Status: DC

## 2019-12-08 MED ORDER — PERFLUTREN LIPID MICROSPHERE
1.0000 mL | INTRAVENOUS | Status: DC | PRN
  Administered 2019-12-08: 1 mL via INTRAVENOUS
  Filled 2019-12-08: qty 10

## 2019-12-08 MED ORDER — DAPAGLIFLOZIN PROPANEDIOL 10 MG PO TABS
10.0000 mg | ORAL_TABLET | Freq: Every day | ORAL | 11 refills | Status: DC
Start: 2019-12-08 — End: 2020-09-03

## 2019-12-08 NOTE — Progress Notes (Signed)
Advanced Heart Failure Clinic Note   Date:  12/08/2019   ID:  Dana LUMBRA, DOB 07/21/1960, MRN [id number]  Location: Home  Provider location: [facility] Type of Visit: Established patient   PCP:  Dana Brome, MD  Cardiologist:  No primary care provider on file. Primary HF: Dr Dana [name]   Chief Complaint:Heart Failure   History of Present Illness: Dana [name] is a 59 y.o. female with a history of obesity, diastolic heart failure, DMI, neuropathy, HTN, hyperlipidemia, CAD (most recent [facility] 08/2016 DES RCA and stent LAD total of 4 stents), anemia, OSA --> started CPAP 12/2016, PAF S/P ablation, PAD S/P L great toe and 4th toe amputation.   Admitted 4/24 - 11/11/17 with chest pain while getting up out of her recliner.  EKG unremarkable. CXR with pulmonary edema. Troponin  0.03 - 0.05. Diuresed with IV lasix with resolution of symptoms. Echo with normal EF as below. Torsemide cut back to 60 mg daily on discharge.   Zio patch 02/2018 (for possible syncope) with no arrhythmias, only occasional PACs/PVCs  She is s/p Cardiomems placement  Here for routine f/u. Doing well adjusting diuretics about once a month with Cardiomems. Reading to day is 9. Does what she wants to do pretty much. Struggling with back pain and swelling in feet. Stable DOE. Using CPAP every night. Unable to wear compression hose because her feet and calves hurt.   Echo today 12/08/19 EF 60-65% RV ok. Grade II DD Personally reviewed     Past Medical History:  Diagnosis Date  . Acquired absence of left great toe ([place])   . Acquired absence of other left toe(s) ([place])   . Anginal pain ([place])   . Atherosclerotic heart disease of native coronary artery without angina pectoris   . CAD (coronary artery disease)    08/2016 DES to RCA, also with history of LAD stenting.   . Chronic diastolic (congestive) heart failure ([place])   . Chronic kidney disease, stage 2 (mild)   . Chronic systolic  (congestive) heart failure ([place])   . Diabetes ([place])   . Diastolic CHF ([place])   . Gastro-esophageal reflux disease without esophagitis   . Hyperlipidemia   . Hypertension   . Hypertensive heart disease with heart failure ([place])   . Hypoxemia   . Idiopathic gout, multiple sites   . Localized edema   . Low back pain   . Mixed hyperlipidemia   . Mixed incontinence   . Morbid (severe) obesity due to excess calories ([place])   . OSA (obstructive sleep apnea)   . Other specified hypothyroidism   . Primary insomnia   . Type 2 diabetes mellitus with diabetic nephropathy [facility])    Past Surgical History:  Procedure Laterality Date  . ABDOMINAL HYSTERECTOMY     Partial- Still has both ovaries  . CARDIOVASCULAR STRESS TEST  08/13/2014   Nuclear; Normal  . CARPAL TUNNEL RELEASE    . CATARACT EXTRACTION, BILATERAL    . CESAREAN SECTION    . CHOLECYSTECTOMY    . CORONARY ANGIOPLASTY WITH STENT PLACEMENT     3 blockages/ 1 stent  . EYE SURGERY    . RIGHT/LEFT HEART CATH AND CORONARY ANGIOGRAPHY N/A 01/07/2017   Procedure: Right/Left Heart Cath and Coronary Angiography;  Surgeon: Larey Dresser, MD;  Location: [facility];  Service: Cardiovascular;  Laterality: N/A;  . ROTATOR CUFF REPAIR    . TOE AMPUTATION Left   . US ECHOCARDIOGRAPHY  05/2017   Normal     Current Outpatient Medications  Medication Sig Dispense Refill  . allopurinol (ZYLOPRIM) 100 MG tablet Take 2 tablets (200 mg total) by mouth daily. 180 tablet 3  . aspirin 81 MG chewable tablet Chew 81 mg by mouth daily.    . carvedilol (COREG) 6.25 MG tablet Take 1.5 tablets (9.375 mg total) by mouth 2 (two) times daily. 90 tablet 3  . clopidogrel (PLAVIX) 75 MG tablet Take 1 tablet (75 mg total) by mouth at bedtime. 90 tablet 3  . EPINEPHrine 0.3 mg/0.3 mL IJ SOAJ injection Use as directed for life-threatening allergic reaction. 4 Device 3  . Evolocumab (REPATHA SURECLICK) 532 MG/ML SOAJ Inject 1 Dose into the skin every 14  (fourteen) days. 2 pen 11  . ezetimibe (ZETIA) 10 MG tablet Take 1 tablet (10 mg total) by mouth daily. 90 tablet 3  . famotidine (PEPCID) 20 MG tablet Take 1 tablet (20 mg total) by mouth daily. 30 tablet 5  . ferrous sulfate 325 (65 FE) MG tablet Take 325 mg by mouth 2 (two) times daily with a meal.    . hydrALAZINE (APRESOLINE) 50 MG tablet Take 1.5 tablets (75 mg total) by mouth 3 (three) times daily. 150 tablet 3  . HYDROcodone-acetaminophen (NORCO) 7.5-325 MG tablet Take 1 tablet by mouth 3 (three) times daily as needed for moderate pain. 90 tablet 0  . insulin NPH Human (HUMULIN N,NOVOLIN N) 100 UNIT/ML injection Inject 30 Units into the skin 2 (two) times daily before a meal.     . insulin regular (NOVOLIN R,HUMULIN R) 100 units/mL injection Inject 7-12 Units into the skin 3 (three) times daily before meals. Per sliding scale    . isosorbide mononitrate (IMDUR) 30 MG 24 hr tablet Take 1 tablet (30 mg total) by mouth daily. 90 tablet 3  . latanoprost (XALATAN) 0.005 % ophthalmic solution Place 1 drop into both eyes at bedtime.    Marland Kitchen LORazepam (ATIVAN) 0.5 MG tablet Take 1 tablet (0.5 mg total) by mouth daily as needed for anxiety. 30 tablet 2  . magnesium oxide (MAG-OX) 400 MG tablet Take 2 tablets (800 mg total) by mouth daily. Take 2 (400 mg) daily=800 mg 60 tablet 1  . metolazone (ZAROXOLYN) 2.5 MG tablet TAKE 1 TABLET BY MOUTH ONCE A WEEK. TAKE EVERY WEDNESDAY 10 tablet 4  . nitroGLYCERIN (NITROSTAT) 0.4 MG SL tablet Place 1 tablet (0.4 mg total) under the tongue every 5 (five) minutes as needed for chest pain. 15 tablet 2  . Omega-3 Fatty Acids (FISH OIL PO) Take 1 capsule by mouth daily.    . OXYGEN Inhale 2 L into the lungs at bedtime.    . pravastatin (PRAVACHOL) 40 MG tablet Take 2 tablets (80 mg total) by mouth at bedtime. 180 tablet 3  . Probiotic CAPS Take 1 capsule by mouth daily.    . promethazine (PHENERGAN) 25 MG tablet Take 1 tablet (25 mg total) by mouth every 6 (six) hours  as needed for nausea or vomiting. 90 tablet 1  . spironolactone (ALDACTONE) 25 MG tablet Take 1 tablet (25 mg total) by mouth daily. 30 tablet 3  . torsemide (DEMADEX) 20 MG tablet Take 40 mg three times a day 180 tablet 3   No current facility-administered medications for this encounter.   Facility-Administered Medications Ordered in Other Encounters  Medication Dose Route Frequency Provider Last Rate Last Admin  . perflutren lipid microspheres (DEFINITY) IV suspension  1-10 mL Intravenous PRN Clegg,  Amy D, NP   1 mL at 12/08/19 1012    Allergies:   Naproxen, Celecoxib, Aspirin, Glucosamine, Metoclopramide, Metolazone, Shellfish-derived products, and Tramadol   Social History:  The patient  reports that she has quit smoking. She has never used smokeless tobacco. She reports previous alcohol use. She reports previous drug use.   Family History:  The patient's family history includes Bipolar disorder in an other family member; Breast cancer in her mother; Coronary artery disease in her mother; Depression in an other family member; Diabetes type II in her mother; Hyperlipidemia in her mother; Hypertension in her father and mother; Lung cancer in her mother; Schizophrenia in an other family member.   ROS:  Please see the history of present illness.   All other systems are personally reviewed and negative.    Vitals:   12/08/19 1025  BP: (!) 180/52  Pulse: 79  SpO2: 100%  Weight: 119 kg (262 lb 6.4 oz)    Exam:   General:  Well appearing. No resp difficulty HEENT: normal Neck: supple. no JVD. Carotids 2+ bilat; no bruits. No lymphadenopathy or thryomegaly appreciated. Cor: PMI nondisplaced. Regular rate & rhythm. No rubs, gallops or murmurs. Lungs: clear Abdomen: obese soft, nontender, nondistended. No hepatosplenomegaly. No bruits or masses. Good bowel sounds. Extremities: no cyanosis, clubbing, rash, severe venous stasis changes/lymphedema. No open sores today Neuro: alert &  orientedx3, cranial nerves grossly intact. moves all 4 extremities w/o difficulty. Affect pleasant    Recent Labs: 12/15/2018: ALT 18; Magnesium 2.2 05/05/2019: B Natriuretic Peptide 47.7 09/18/2019: BUN 20; Creatinine, Ser 1.42; Hemoglobin 9.1; Platelets 348; Potassium 4.6; Sodium 139  Personally reviewed   Wt Readings from Last 3 Encounters:  09/18/19 121.1 kg (267 lb)  09/17/19 121.4 kg (267 lb 9.6 oz)  09/07/19 119.7 kg (264 lb)    ECG: NSR 79 No ST-T wave abnormalities. Personally reviewed  ASSESSMENT AND PLAN:  . Chronic Diastolic Heart Failure - Echo 5/19 LVEF 65-70%, Grade 2 DD - s/p Cardiomems device 03/11/18. Cardiomems reading 42mmHG today, Goal 10.  - Chronic NYHA III Volume status stable.   - Echo today 12/08/19 EF 60-65% RV normal grade II DD Personally reviewed - Continue torsemide 40 mg three times a day + metolazone 2.5 mg on Wednesday.  - Continue coreg to 9.375 mg twice a day.   - Increase hydralazine 100 mg three times a day  - Continue  imdur 30  mg daily.  - Continue spiro 25 mg daily.  - still with severe lymphedema/venous stasis. Stressed need for LE compression  2. Obesity Body mass index is 45.04 kg/m. - Discussed portion control 3. OSA  - Continue CPAP 4. CAD - 4 stents. 08/2016  stent RCA.  - 2018 LHC nonobstructive.  - No s/s ischemia - Continue plavix, bb, and statin.  - She has seen Dr. Debara Pickett in [facility] 3/21. Now on Repatha - Consider SGLT2I  But she is concerned about cost - see below 5. PAF - S/P ablation 2016. - Stressed compliance with CPAP - Holter monitor 06/2017 with occasional PACs and SVT.  - BB as above.   7. HTN:  - Markedly elevated here but SBP 130s at home.  - Increase hydralazine to 100  8. CKD III - Baseline creatinine appears to be 1.3 - 1.5 9. DMII - On insulin. Followed by Dr. Iran Planas (Endo in [place]) - We discussed Farxiga at length. Unable to afford currently because she is going into donut hole. Will have  her  talk to our PharmD   Signed, [name], MD  12/08/2019 10:15 AM  [address] [place] [address] [phone] (office) [phone] (fax)

## 2019-12-08 NOTE — Addendum Note (Signed)
Encounter addended by: Scarlette Calico, RN on: 12/08/2019 11:07 AM  Actions taken: Clinical Note Signed, Pharmacy for encounter modified, Order list changed

## 2019-12-08 NOTE — Patient Instructions (Signed)
Increase Hydralazine to 100 mg Three times a day   Start Farxiga 10 mg daily  Your physician recommends that you schedule a follow-up appointment in: 4 months  If you have any questions or concerns before your next appointment please send [name] a message through [place] or call our office at [phone].  At the [facility], you and your health needs are our priority. As part of our continuing mission to provide you with exceptional heart care, we have created designated Provider Care Teams. These Care Teams include your primary Cardiologist (physician) and Advanced Practice Providers (APPs- Physician Assistants and Nurse Practitioners) who all work together to provide you with the care you need, when you need it.   You may see any of the following providers on your designated Care Team at your next follow up: Marland Kitchen Dr [name] . Dr Loralie Champagne . Darrick Grinder, NP . Lyda Jester, PA . Audry Riles, PharmD   Please be sure to bring in all your medications bottles to every appointment.

## 2019-12-08 NOTE — Progress Notes (Signed)
  Echocardiogram 2D Echocardiogram has been performed.  Dana [name] 12/08/2019, 10:16 AM

## 2019-12-09 ENCOUNTER — Telehealth (HOSPITAL_COMMUNITY): Payer: Self-pay | Admitting: Pharmacy Technician

## 2019-12-09 NOTE — Telephone Encounter (Signed)
Patient left a message stating that she cannot use her copay card at her pharmacy. I called her pharmacy and let them know that even though she is a Medicare recipient she can use the card. Spoke with patient, who will take her card to the pharmacy.  Charlann Boxer, CPhT

## 2019-12-10 ENCOUNTER — Telehealth (HOSPITAL_COMMUNITY): Payer: Self-pay | Admitting: Pharmacy Technician

## 2019-12-10 NOTE — Telephone Encounter (Signed)
Was able to obtain PAN grant for [place].  Member ID: [id number] Group ID: [id number] RxBin ID: [id number] PCN: [id number] Eligibility Start Date: 09/11/2019 Eligibility End Date: 12/08/2020 Assistance Amount: $1,000.00  Called pharmacy and provided billing information.   Charlann Boxer, CPhT

## 2019-12-15 ENCOUNTER — Other Ambulatory Visit: Payer: Self-pay

## 2019-12-15 MED ORDER — HYDROCODONE-ACETAMINOPHEN 7.5-325 MG PO TABS: 1.0000 | ORAL_TABLET | Freq: Three times a day (TID) | ORAL | 0 refills | Status: DC | PRN

## 2019-12-17 NOTE — Progress Notes (Addendum)
Subjective:  Patient ID: Dana [name], female    DOB: 09/27/1960  Age: 59 y.o. MRN: [id number]  Chief Complaint  Patient presents with  . Gastroesophageal Reflux  . Hypertension  . Hyperlipidemia  . Diabetes  . Atrial Fibrillation    HPI Pt presents for follow up of hypertensive cardiovascular disease with combined CHF and CKD Stage 2, hyperlipidemia, diabetes, and GERD.  Her current cardiac medication regimen includes a diuretic (torsemide and spironolactone.), a beta-blocker (Carvedilol), Hydralazine, and plavix, baby aspirin, and repatha..  She is tolerating the medication well without side effects.  Compliance with treatment has been good; she takes her medication as directed, maintains her diet and exercise regimen, and follows up as directed.  She follows with Dr. Debara Pickett for cardiology.    Pt presents with hyperlipidemia.  Current treatment includes Pravachol, zetia, repatha and fish oil.  Compliance with treatment has been good; she takes her medication as directed, maintains her low cholesterol diet, follows up as directed, and maintains her exercise regimen.  She denies experiencing any hypercholesterolemia related symptoms.      Additionally, she presents with history of type 2 diabetes mellitus with diabetic polyneuropathy.  she has type 2, insulin requiring diabetes, complicated by nephropathy, retinopathy, and peripheral neuropathy.  Current meds include insulin ( NPH- 30 U twice a day; NOVOLIN R Regular U-100 SSI 6-10 U before meals ), and farxiga.  She eats healthy and exercise regimen, follows up as directed, and is keeping a glucose diary.  Preventive care: She performs foot self-exams daily and has no sores.   Her last ophthalmology exam was in 09/2019 and the results showed proliferative retinopathy. Current diabetes symptoms include peripheral neuropathy (manifested as numbness).  She denies polydipsia, polyphagia or polyuria.  She follows with Dr. Tamala Julian, her  endocrinologist. Her last a1c 6.2.  Patient has had diarrhea, constipation, and nausea, vomiting. Randomly as episodic bouts of each. She saw Dr. Scherrie November in the past.    Social History   Socioeconomic History  . Marital status: Married    Spouse name: Not on file  . Number of children: Not on file  . Years of education: Not on file  . Highest education level: Not on file  Occupational History  . Not on file  Tobacco Use  . Smoking status: Former Research scientist (life sciences)  . Smokeless tobacco: Never Used  Substance and Sexual Activity  . Alcohol use: Not Currently  . Drug use: Not Currently  . Sexual activity: Not on file  Other Topics Concern  . Not on file  Social History Narrative  . Not on file   Social Determinants of Health   Financial Resource Strain:   . Difficulty of Paying Living Expenses:   Food Insecurity:   . Worried About Charity fundraiser in the Last Year:   . Arboriculturist in the Last Year:   Transportation Needs:   . Film/video editor (Medical):   Marland Kitchen Lack of Transportation (Non-Medical):   Physical Activity:   . Days of Exercise per Week:   . Minutes of Exercise per Session:   Stress:   . Feeling of Stress :   Social Connections:   . Frequency of Communication with Friends and Family:   . Frequency of Social Gatherings with Friends and Family:   . Attends Religious Services:   . Active Member of Clubs or Organizations:   . Attends Archivist Meetings:   Marland Kitchen Marital Status:    Past  Medical History:  Diagnosis Date  . Acquired absence of left great toe ([place])   . Acquired absence of other left toe(s) ([place])   . Anginal pain ([place])   . Atherosclerotic heart disease of native coronary artery without angina pectoris   . CAD (coronary artery disease)    08/2016 DES to RCA, also with history of LAD stenting.   . Chronic diastolic (congestive) heart failure ([place])   . Chronic kidney disease, stage 2 (mild)   . Chronic systolic (congestive) heart  failure ([place])   . Diabetes ([place])   . Diastolic CHF ([place])   . Gastro-esophageal reflux disease without esophagitis   . Hyperlipidemia   . Hypertension   . Hypertensive heart disease with heart failure ([place])   . Hypoxemia   . Idiopathic gout, multiple sites   . Localized edema   . Low back pain   . Mixed hyperlipidemia   . Mixed incontinence   . Morbid (severe) obesity due to excess calories ([place])   . OSA (obstructive sleep apnea)   . Other specified hypothyroidism   . Primary insomnia   . Type 2 diabetes mellitus with diabetic nephropathy ([facility])    Family History  Problem Relation Age of Onset  . Hypertension Father   . Coronary artery disease Mother   . Hypertension Mother   . Hyperlipidemia Mother   . Diabetes type II Mother   . Breast cancer Mother   . Lung cancer Mother   . Bipolar disorder Other   . Depression Other   . Schizophrenia Other     Review of Systems  Constitutional: Positive for fatigue. Negative for chills and fever.  HENT: Negative for congestion, ear pain and sore throat.   Respiratory: Positive for cough and shortness of breath.        With exertion  Cardiovascular: Negative for chest pain and palpitations.  Gastrointestinal: Positive for abdominal pain, constipation (intermittent diarrhea and constipation. Vomiting every few day.  ), diarrhea, nausea and vomiting.  Endocrine: Negative for polydipsia, polyphagia and polyuria.  Genitourinary: Negative for dysuria and urgency.  Musculoskeletal: Positive for arthralgias and back pain. Negative for myalgias.  Skin: Positive for rash.  Neurological: Negative for dizziness and headaches.       BL peripheral neuropathy causes pain.  Psychiatric/Behavioral: Negative for dysphoric mood. The patient is not nervous/anxious.      Objective:  BP (!) 150/68   Pulse 64   Temp (!) 97.3 F (36.3 C)   Resp 18   Ht 5' 4.5" (1.638 m)   Wt 262 lb (118.8 kg)   BMI 44.28 kg/m   BP/Weight 12/21/2019 12/08/2019  07/18/2438  Systolic BP 102 725 366  Diastolic BP 68 52 64  Wt. (Lbs) 262 262.4 267  BMI 44.28 45.04 45.83    Physical Exam Vitals reviewed.  Constitutional:      Appearance: Normal appearance. She is normal weight.  Cardiovascular:     Rate and Rhythm: Normal rate. Rhythm irregular.     Heart sounds: Normal heart sounds.  Pulmonary:     Effort: Pulmonary effort is normal. No respiratory distress.     Breath sounds: Normal breath sounds.  Abdominal:     General: Abdomen is flat. Bowel sounds are normal.     Palpations: Abdomen is soft.     Tenderness: There is no abdominal tenderness.  Neurological:     Mental Status: She is alert and oriented to person, place, and time.  Psychiatric:  Mood and Affect: Mood normal.        Behavior: Behavior normal.     Lab Results  Component Value Date   WBC 11.6 (H) 09/18/2019   HGB 9.1 (L) 09/18/2019   HCT 29.1 (L) 09/18/2019   PLT 348 09/18/2019   GLUCOSE 100 (H) 09/18/2019   CHOL 205 (H) 09/18/2019   TRIG 168 (H) 09/18/2019   HDL 45 09/18/2019   LDLCALC 130 (H) 09/18/2019   ALT 18 12/15/2018   AST 15 09/18/2019   NA 139 09/18/2019   K 4.6 09/18/2019   CL 101 09/18/2019   CREATININE 1.42 (H) 09/18/2019   BUN 20 09/18/2019   CO2 23 05/05/2019   INR 1.08 01/01/2017      Assessment & Plan:  1. Diabetic polyneuropathy associated with type 2 diabetes mellitus ([place]) The current medical regimen is effective;  continue present plan and medications. Managed by Dr. Tamala Julian - CBC with Differential/Platelet  2. Hypertensive heart and kidney disease with other heart failure and stage 2 chronic kidney disease ([place]) The current medical regimen is effective;  continue present plan and medications.- Comprehensive metabolic panel  3. Mixed hyperlipidemia The current medical regimen is effective;  continue present plan and medications. - Lipid panel  4. Candidal intertrigo Start on fluconazole 100 mg once daily x 7 days.   5.  Depression, major, recurrent, mild ([place]) The current medical regimen is effective;  continue present plan and medications.  6. Hypomagnesemia - Magnesium  7. Diarrhea, unspecified type Pt to call Dr. Derrill Kay   8. Non-intractable vomiting with nausea, unspecified vomiting type Likely due to gastroparesis.     Follow-up: Return in about 3 months (around 03/22/2020) for fasting.  AVS was given to patient prior to departure.  [name] [facility] [phone]

## 2019-12-21 ENCOUNTER — Other Ambulatory Visit: Payer: Self-pay

## 2019-12-21 ENCOUNTER — Ambulatory Visit (INDEPENDENT_AMBULATORY_CARE_PROVIDER_SITE_OTHER): Payer: Medicare Other | Admitting: Family Medicine

## 2019-12-21 ENCOUNTER — Encounter: Payer: Self-pay | Admitting: Family Medicine

## 2019-12-21 ENCOUNTER — Ambulatory Visit (HOSPITAL_COMMUNITY)
Admission: RE | Admit: 2019-12-21 | Discharge: 2019-12-21 | Disposition: A | Discharge status: Home / Self Care | Payer: Medicare Other | Source: Ambulatory Visit | Attending: Adult Health | Admitting: Adult Health

## 2019-12-21 VITALS — BP 142/70 | HR 64 | Temp 97.3°F | Resp 18 | Ht 64.5 in | Wt 262.0 lb

## 2019-12-21 DIAGNOSIS — N182 Chronic kidney disease, stage 2 (mild): Secondary | ICD-10-CM | POA: Diagnosis not present

## 2019-12-21 DIAGNOSIS — I5032 Chronic diastolic (congestive) heart failure: Secondary | ICD-10-CM

## 2019-12-21 DIAGNOSIS — R1110 Vomiting, unspecified: Secondary | ICD-10-CM | POA: Insufficient documentation

## 2019-12-21 DIAGNOSIS — I5089 Other heart failure: Secondary | ICD-10-CM

## 2019-12-21 DIAGNOSIS — E782 Mixed hyperlipidemia: Secondary | ICD-10-CM

## 2019-12-21 DIAGNOSIS — R197 Diarrhea, unspecified: Secondary | ICD-10-CM

## 2019-12-21 DIAGNOSIS — I130 Hypertensive heart and chronic kidney disease with heart failure and stage 1 through stage 4 chronic kidney disease, or unspecified chronic kidney disease: Secondary | ICD-10-CM | POA: Diagnosis not present

## 2019-12-21 DIAGNOSIS — F330 Major depressive disorder, recurrent, mild: Secondary | ICD-10-CM

## 2019-12-21 DIAGNOSIS — B372 Candidiasis of skin and nail: Secondary | ICD-10-CM | POA: Insufficient documentation

## 2019-12-21 DIAGNOSIS — E1142 Type 2 diabetes mellitus with diabetic polyneuropathy: Secondary | ICD-10-CM

## 2019-12-21 DIAGNOSIS — R112 Nausea with vomiting, unspecified: Secondary | ICD-10-CM

## 2019-12-21 MED ORDER — FLUCONAZOLE 100 MG PO TABS: 100.0000 mg | ORAL_TABLET | Freq: Every day | ORAL | 0 refills | Status: DC

## 2019-12-21 NOTE — Progress Notes (Addendum)
    PA Systolic: 42 mmHg PA Diastolic: 13 mmHg PA Mean: 26 mmHg Heart Rate: 79 bpm  I have reviewed current PAD from cardiomems device. PAD reading stable at 13.   Continue current diuretic regimen.   Follow monthly    Follow monthly   Emmett Bracknell NP-C  5:22 PM

## 2019-12-22 ENCOUNTER — Telehealth: Payer: Self-pay | Admitting: Family Medicine

## 2019-12-22 LAB — CBC WITH DIFFERENTIAL/PLATELET
Basophils Absolute: 0.1 10*3/uL (ref 0.0–0.2)
Basos: 1 %
EOS (ABSOLUTE): 0.2 10*3/uL (ref 0.0–0.4)
Eos: 2 %
Hematocrit: 28.9 % — ABNORMAL LOW (ref 34.0–46.6)
Hemoglobin: 9 g/dL — ABNORMAL LOW (ref 11.1–15.9)
Immature Grans (Abs): 0.1 10*3/uL (ref 0.0–0.1)
Immature Granulocytes: 1 %
Lymphocytes Absolute: 2.3 10*3/uL (ref 0.7–3.1)
Lymphs: 20 %
MCH: 25.8 pg — ABNORMAL LOW (ref 26.6–33.0)
MCHC: 31.1 g/dL — ABNORMAL LOW (ref 31.5–35.7)
MCV: 83 fL (ref 79–97)
Monocytes Absolute: 0.8 10*3/uL (ref 0.1–0.9)
Monocytes: 7 %
Neutrophils Absolute: 8 10*3/uL — ABNORMAL HIGH (ref 1.4–7.0)
Neutrophils: 69 %
Platelets: 339 10*3/uL (ref 150–450)
RBC: 3.49 x10E6/uL — ABNORMAL LOW (ref 3.77–5.28)
RDW: 15.2 % (ref 11.7–15.4)
WBC: 11.4 10*3/uL — ABNORMAL HIGH (ref 3.4–10.8)

## 2019-12-22 LAB — COMPREHENSIVE METABOLIC PANEL
ALT: 15 IU/L (ref 0–32)
AST: 20 IU/L (ref 0–40)
Albumin/Globulin Ratio: 1 — ABNORMAL LOW (ref 1.2–2.2)
Albumin: 3.5 g/dL — ABNORMAL LOW (ref 3.8–4.9)
Alkaline Phosphatase: 168 IU/L — ABNORMAL HIGH (ref 48–121)
BUN/Creatinine Ratio: 17 (ref 9–23)
BUN: 23 mg/dL (ref 6–24)
Bilirubin Total: 0.2 mg/dL (ref 0.0–1.2)
CO2: 22 mmol/L (ref 20–29)
Calcium: 8.7 mg/dL (ref 8.7–10.2)
Chloride: 105 mmol/L (ref 96–106)
Creatinine, Ser: 1.35 mg/dL — ABNORMAL HIGH (ref 0.57–1.00)
GFR calc Af Amer: 51 mL/min/{1.73_m2} — ABNORMAL LOW (ref >59)
GFR calc non Af Amer: 44 mL/min/{1.73_m2} — ABNORMAL LOW (ref >59)
Globulin, Total: 3.4 g/dL (ref 1.5–4.5)
Glucose: 120 mg/dL — ABNORMAL HIGH (ref 65–99)
Potassium: 4.3 mmol/L (ref 3.5–5.2)
Sodium: 140 mmol/L (ref 134–144)
Total Protein: 6.9 g/dL (ref 6.0–8.5)

## 2019-12-22 LAB — LIPID PANEL
Chol/HDL Ratio: 2.6 ratio (ref 0.0–4.4)
Chol/HDL Ratio: 2.7 ratio (ref 0.0–4.4)
Cholesterol, Total: 126 mg/dL (ref 100–199)
Cholesterol, Total: 127 mg/dL (ref 100–199)
HDL: 47 mg/dL (ref >39)
HDL: 49 mg/dL (ref >39)
LDL Chol Calc (NIH): 56 mg/dL (ref 0–99)
LDL Chol Calc (NIH): 57 mg/dL (ref 0–99)
Triglycerides: 124 mg/dL (ref 0–149)
Triglycerides: 126 mg/dL (ref 0–149)
VLDL Cholesterol Cal: 22 mg/dL (ref 5–40)
VLDL Cholesterol Cal: 22 mg/dL (ref 5–40)

## 2019-12-22 LAB — MAGNESIUM: Magnesium: 1.9 mg/dL (ref 1.6–2.3)

## 2019-12-22 LAB — CARDIOVASCULAR RISK ASSESSMENT

## 2019-12-22 NOTE — Progress Notes (Signed)
  Chronic Care Management   Outreach Note  12/22/2019 Name: Dana [name] MRN: [id number] DOB: [birth year]  Referred by: [name], MD Reason for referral : No chief complaint on file.   An unsuccessful telephone outreach was attempted today. The patient was referred to the pharmacist for assistance with care management and care coordination.   This note is not being shared with the patient for the following reason: To respect privacy (The patient or proxy has requested that the information not be shared).  Follow Up Plan:   Earney Hamburg Upstream Scheduler

## 2019-12-23 ENCOUNTER — Telehealth: Payer: Self-pay | Admitting: Family Medicine

## 2019-12-23 NOTE — Progress Notes (Signed)
  Chronic Care Management   Note  12/23/2019 Name: Dana [name] MRN: [id number] DOB: Jun 15, 1961  [name] is a 59 y.o. year old female who is a primary care patient of Cox, Kirsten, MD. I reached out to [name] by phone today in response to a referral sent by [name]'s PCP, Cox, Kirsten, MD.   [name] was given information about Chronic Care Management services today including:  1. CCM service includes personalized support from designated clinical staff supervised by her physician, including individualized plan of care and coordination with other care providers 2. 24/7 contact phone numbers for assistance for urgent and routine care needs. 3. Service will only be billed when office clinical staff spend 20 minutes or more in a month to coordinate care. 4. Only one practitioner may furnish and bill the service in a calendar month. 5. The patient may stop CCM services at any time (effective at the end of the month) by phone call to the office staff.   Patient agreed to services and verbal consent obtained.   This note is not being shared with the patient for the following reason: To respect privacy (The patient or proxy has requested that the information not be shared).  Follow up plan:   Earney Hamburg Upstream Scheduler

## 2019-12-24 ENCOUNTER — Encounter: Payer: Self-pay | Admitting: Internal Medicine

## 2019-12-24 ENCOUNTER — Telehealth (INDEPENDENT_AMBULATORY_CARE_PROVIDER_SITE_OTHER): Payer: Medicare Other | Admitting: Internal Medicine

## 2019-12-24 ENCOUNTER — Telehealth: Payer: Self-pay | Admitting: Internal Medicine

## 2019-12-24 VITALS — BP 132/68 | HR 74 | Wt 260.0 lb

## 2019-12-24 DIAGNOSIS — E782 Mixed hyperlipidemia: Secondary | ICD-10-CM

## 2019-12-24 DIAGNOSIS — I2510 Atherosclerotic heart disease of native coronary artery without angina pectoris: Secondary | ICD-10-CM

## 2019-12-24 NOTE — Patient Instructions (Signed)
Medication Instructions:  Your physician recommends that you continue on your current medications as directed. Please refer to the Current Medication list given to you today.  *If you need a refill on your cardiac medications before your next appointment, please call your pharmacy*   Lab Work: FASTING lipid panel in 6 months  If you have labs (blood work) drawn today and your tests are completely normal, you will receive your results only by: Marland Kitchen MyChart Message (if you have MyChart) OR . A paper copy in the mail If you have any lab test that is abnormal or we need to change your treatment, we will call you to review the results.   Testing/Procedures: NONE   Follow-Up: At [facility], you and your health needs are our priority.  As part of our continuing mission to provide you with exceptional heart care, we have created designated Provider Care Teams.  These Care Teams include your primary Cardiologist (physician) and Advanced Practice Providers (APPs -  Physician Assistants and Nurse Practitioners) who all work together to provide you with the care you need, when you need it.  We recommend signing up for the patient portal called "MyChart".  Sign up information is provided on this After Visit Summary.  MyChart is used to connect with patients for Virtual Visits (Telemedicine).  Patients are able to view lab/test results, encounter notes, upcoming appointments, etc.  Non-urgent messages can be sent to your provider as well.   To learn more about what you can do with MyChart, go to [email or web address].    Your next appointment:   6 month(s) - lipid clinic  The format for your next appointment:   Either In Person or Virtual  Provider:   K. Mali Hilty, MD   Other Instructions

## 2019-12-24 NOTE — Telephone Encounter (Signed)
Patient has been approved for $2500 healthwell grant 11/22/2019 - 11/21/2020 healthwell ID [id number]  Pharmacy card info: ID: [id number] PC Group: [id number] PC BIN: [id number] PC PCN: [id number] PC Processor: [place]: Hypercholesterolemia - Medicare Access

## 2019-12-24 NOTE — Progress Notes (Signed)
Virtual Visit via Video Note   This visit type was conducted due to national recommendations for restrictions regarding the COVID-19 Pandemic (e.g. social distancing) in an effort to limit this patient's exposure and mitigate transmission in our community.  Due to her co-morbid illnesses, this patient is at least at moderate risk for complications without adequate follow up.  This format is felt to be most appropriate for this patient at this time.  All issues noted in this document were discussed and addressed.  A limited physical exam was performed with this format.  Please refer to the patient's chart for her consent to telehealth for [facility].   Evaluation Performed:  Caregility video visit  Date:  12/24/2019   ID:  Dana [name], DOB September 09, 1960, MRN [id number]  Patient Location:  [place] [address]  Provider location:   [address], [place], [place] [address]  PCP:  [name], MD  Cardiologist:  No primary care provider on file. Electrophysiologist:  None   Chief Complaint:  No complaints  History of Present Illness:    ANGELLY SPEARING is a 59 y.o. female who presents via audio/video conferencing for a telehealth visit today.  HPI:  CHERYE GAERTNER is a 59 y.o. female who is being seen today for the evaluation of dyslipidemia at the request of Cox, Kirsten, MD.  Perry is a complex 59 year old female with a history of obesity, diastolic heart failure, neuropathy, hypertension, dyslipidemia and coronary artery disease with 4 prior stents as well as PAF status post ablation and PAD with left toe amputation.  She presents for evaluation and management of dyslipidemia.  In August 2020 her total cholesterol was 195, triglycerides 136, HDL 47 and LDL 121.  Repeat labs in November showed the total cholesterol gone up to 228 with an LDL of 154.  She reports compliance with her medications including ezetimibe 10 mg daily and pravastatin 80 mg nightly.   Her target LDL is less than 70.  Deviously she had been approved for and started on Repatha which was arranged through her primary care physician Dr. [name].  She had a 59-month approval but started only in the second month and then subsequently required some sampling.  Ultimately she ran out of the medication and could not afford it and therefore has not been on it.  She was referred for definitive therapies.  12/24/2019  Ms. Saltos is seen today for follow-up. Overall she is doing well. She was approved for the health well foundation grant therefore she gets her medicine essentially for free. He has been using the medication with good response and no side effects. For some reason the lipid profile was run twice at her primary care provider's office however the numbers are essentially the same. Her total cholesterol was 127, triglycerides 124, HDL 49 and LDL 56 (reduced from 130) approximately 3 months ago.  The patient does not have symptoms concerning for COVID-19 infection (fever, chills, cough, or new SHORTNESS OF BREATH).    Prior CV studies:   The following studies were reviewed today:  Labs  PMHx:  Past Medical History:  Diagnosis Date  . Acquired absence of left great toe ([place])   . Acquired absence of other left toe(s) ([place])   . Anginal pain ([place])   . Atherosclerotic heart disease of native coronary artery without angina pectoris   . CAD (coronary artery disease)    08/2016 DES to RCA, also with history of LAD stenting.   Marland Kitchen  Chronic diastolic (congestive) heart failure ([place])   . Chronic kidney disease, stage 2 (mild)   . Chronic systolic (congestive) heart failure ([place])   . Diabetes ([place])   . Diastolic CHF ([place])   . Gastro-esophageal reflux disease without esophagitis   . Hyperlipidemia   . Hypertension   . Hypertensive heart disease with heart failure ([place])   . Hypoxemia   . Idiopathic gout, multiple sites   . Localized edema   . Low back pain   . Mixed hyperlipidemia    . Mixed incontinence   . Morbid (severe) obesity due to excess calories ([place])   . OSA (obstructive sleep apnea)   . Other specified hypothyroidism   . Primary insomnia   . Type 2 diabetes mellitus with diabetic nephropathy [facility])     Past Surgical History:  Procedure Laterality Date  . ABDOMINAL HYSTERECTOMY     Partial- Still has both ovaries  . CARDIOVASCULAR STRESS TEST  08/13/2014   Nuclear; Normal  . CARPAL TUNNEL RELEASE    . CATARACT EXTRACTION, BILATERAL    . CESAREAN SECTION    . CHOLECYSTECTOMY    . CORONARY ANGIOPLASTY WITH STENT PLACEMENT     3 blockages/ 1 stent  . EYE SURGERY    . RIGHT/LEFT HEART CATH AND CORONARY ANGIOGRAPHY N/A 01/07/2017   Procedure: Right/Left Heart Cath and Coronary Angiography;  Surgeon: Larey Dresser, MD;  Location: [place] [facility];  Service: Cardiovascular;  Laterality: N/A;  . ROTATOR CUFF REPAIR    . TOE AMPUTATION Left   . US ECHOCARDIOGRAPHY  05/2017   Normal    FAMHx:  Family History  Problem Relation Age of Onset  . Hypertension Father   . Coronary artery disease Mother   . Hypertension Mother   . Hyperlipidemia Mother   . Diabetes type II Mother   . Breast cancer Mother   . Lung cancer Mother   . Bipolar disorder Other   . Depression Other   . Schizophrenia Other     SOCHx:   reports that she has quit smoking. She has never used smokeless tobacco. She reports previous alcohol use. She reports previous drug use.  ALLERGIES:  Allergies  Allergen Reactions  . Naproxen Hives and Rash  . Celecoxib Other (See Comments)    Stomach pain  . Aspirin Other (See Comments)    CAN NOT TAKE DUE TO ULCERS  . Glucosamine Hives, Swelling and Other (See Comments)    Angioedema   . Metoclopramide Other (See Comments)    Facial twitching and stuttering  . Metolazone Other (See Comments)    Acute renal failure  . Shellfish-Derived Products Hives, Swelling and Other (See Comments)    Angioedema   . Tramadol Nausea And  Vomiting         MEDS:  Current Meds  Medication Sig  . allopurinol (ZYLOPRIM) 100 MG tablet Take 2 tablets (200 mg total) by mouth daily.  Marland Kitchen aspirin 81 MG chewable tablet Chew 81 mg by mouth daily.  . carvedilol (COREG) 6.25 MG tablet Take 1.5 tablets (9.375 mg total) by mouth 2 (two) times daily.  . clopidogrel (PLAVIX) 75 MG tablet Take 1 tablet (75 mg total) by mouth at bedtime.  . dapagliflozin propanediol (FARXIGA) 10 MG TABS tablet Take 1 tablet (10 mg total) by mouth daily before breakfast.  . EPINEPHrine 0.3 mg/0.3 mL IJ SOAJ injection Use as directed for life-threatening allergic reaction.  . Evolocumab (REPATHA SURECLICK) 720 MG/ML SOAJ Inject 1 Dose into  the skin every 14 (fourteen) days.  Marland Kitchen ezetimibe (ZETIA) 10 MG tablet Take 1 tablet (10 mg total) by mouth daily.  . famotidine (PEPCID) 20 MG tablet Take 1 tablet (20 mg total) by mouth daily.  . ferrous sulfate 325 (65 FE) MG tablet Take 325 mg by mouth 2 (two) times daily with a meal.  . fluconazole (DIFLUCAN) 100 MG tablet Take 1 tablet (100 mg total) by mouth daily.  . hydrALAZINE (APRESOLINE) 100 MG tablet Take 1 tablet (100 mg total) by mouth 3 (three) times daily.  Marland Kitchen HYDROcodone-acetaminophen (NORCO) 7.5-325 MG tablet Take 1 tablet by mouth 3 (three) times daily as needed for moderate pain.  Marland Kitchen insulin NPH Human (HUMULIN N,NOVOLIN N) 100 UNIT/ML injection Inject 30 Units into the skin 2 (two) times daily before a meal.   . insulin regular (NOVOLIN R,HUMULIN R) 100 units/mL injection Inject 7-12 Units into the skin 3 (three) times daily before meals. Per sliding scale  . latanoprost (XALATAN) 0.005 % ophthalmic solution Place 1 drop into both eyes at bedtime.  Marland Kitchen LORazepam (ATIVAN) 0.5 MG tablet Take 1 tablet (0.5 mg total) by mouth daily as needed for anxiety.  . magnesium oxide (MAG-OX) 400 MG tablet Take 2 tablets (800 mg total) by mouth daily. Take 2 (400 mg) daily=800 mg  . metolazone (ZAROXOLYN) 2.5 MG tablet TAKE 1  TABLET BY MOUTH ONCE A WEEK. TAKE EVERY WEDNESDAY  . nitroGLYCERIN (NITROSTAT) 0.4 MG SL tablet Place 1 tablet (0.4 mg total) under the tongue every 5 (five) minutes as needed for chest pain.  . Omega-3 Fatty Acids (FISH OIL PO) Take 1 capsule by mouth daily.  . OXYGEN Inhale 2 L into the lungs at bedtime.  . pravastatin (PRAVACHOL) 40 MG tablet Take 2 tablets (80 mg total) by mouth at bedtime.  . Probiotic CAPS Take 1 capsule by mouth daily.  . promethazine (PHENERGAN) 25 MG tablet Take 1 tablet (25 mg total) by mouth every 6 (six) hours as needed for nausea or vomiting.  . torsemide (DEMADEX) 20 MG tablet Take 40 mg three times a day     ROS: Pertinent items noted in HPI and remainder of comprehensive ROS otherwise negative.  Labs/Other Tests and Data Reviewed:    Recent Labs: 05/05/2019: B Natriuretic Peptide 47.7 12/21/2019: ALT 15; BUN 23; Creatinine, Ser 1.35; Hemoglobin 9.0; Magnesium 1.9; Platelets 339; Potassium 4.3; Sodium 140   Recent Lipid Panel Lab Results  Component Value Date/Time   CHOL 127 12/21/2019 08:36 AM   TRIG 124 12/21/2019 08:36 AM   HDL 49 12/21/2019 08:36 AM   CHOLHDL 2.6 12/21/2019 08:36 AM   CHOLHDL 4.6 11/07/2017 04:45 AM   LDLCALC 56 12/21/2019 08:36 AM    Wt Readings from Last 3 Encounters:  12/24/19 260 lb (117.9 kg)  12/21/19 262 lb (118.8 kg)  12/08/19 262 lb 6.4 oz (119 kg)     Exam:    Vital Signs:  BP 132/68   Pulse 74   Wt 260 lb (117.9 kg)   BMI 43.94 kg/m    General appearance: alert, no distress and morbidly obese Lungs: no visual respiratory difficulty Extremities: extremities normal, atraumatic, no cyanosis or edema Skin: Skin color, texture, turgor normal. No rashes or lesions Neurologic: Mental status: Alert, oriented, thought content appropriate Psych: Pleasant  ASSESSMENT & PLAN:    1. Mixed dyslipidemia, goal LDL less than 70 2. Coronary artery disease with prior PCI 3. Chronic diastolic heart failure 4. PAF status  post ablation 5.  PAD 6. Type 2 diabetes with neuropathy  Ms. Mcmichen has had an excellent response in her lipids to PCSK9 inhibitor therapy. She is now at target. Plan to continue current medications. She was approved for the health well grant for the rest of the year which should essentially cover the cost of her co-pays. Repeat lipids in 6 months and follow-up with me at that time.  COVID-19 Education: The signs and symptoms of COVID-19 were discussed with the patient and how to seek care for testing (follow up with PCP or arrange E-visit).  The importance of social distancing was discussed today.  Patient Risk:   After full review of this patients clinical status, I feel that they are at least moderate risk at this time.  Time:   Today, I have spent 15 minutes with the patient with telehealth technology discussing dyslipidemia.     Medication Adjustments/Labs and Tests Ordered: Current medicines are reviewed at length with the patient today.  Concerns regarding medicines are outlined above.   Tests Ordered: Orders Placed This Encounter  Procedures  . Lipid panel    Medication Changes: No orders of the defined types were placed in this encounter.   Disposition:  in 6 month(s)  Pixie Casino, MD, [facility], [place] Director of the Advanced Lipid Disorders &  Cardiovascular Risk Reduction Clinic Diplomate of the American Board of Clinical Lipidology Attending Cardiologist  Direct Dial: [phone]  Fax: [phone]  Website:  [email or web address]  Pixie Casino, MD  12/24/2019 11:15 AM

## 2020-01-13 ENCOUNTER — Other Ambulatory Visit: Payer: Self-pay

## 2020-01-13 MED ORDER — HYDROCODONE-ACETAMINOPHEN 7.5-325 MG PO TABS: 1.0000 | ORAL_TABLET | Freq: Three times a day (TID) | ORAL | 0 refills | Status: DC | PRN

## 2020-02-11 ENCOUNTER — Other Ambulatory Visit: Payer: Self-pay

## 2020-02-12 MED ORDER — HYDROCODONE-ACETAMINOPHEN 7.5-325 MG PO TABS: 1.0000 | ORAL_TABLET | Freq: Three times a day (TID) | ORAL | 0 refills | Status: DC | PRN

## 2020-02-14 NOTE — Chronic Care Management (AMB) (Addendum)
Chronic Care Management Pharmacy  Name: NAUDIA CROSLEY  MRN: [id number] DOB: 05-29-1961  Chief Complaint/ HPI  Dana [name],  59 y.o. , female presents for their Initial CCM visit with the clinical pharmacist via telephone due to COVID-19 Pandemic.  PCP : [name], MD  Their chronic conditions include: afib, CAD, HTN, diastolic heart failure, COPD, GERD, DM2, anemia, HLD, chronic pain syndrome, depression, gout, insomnia.   Office Visits: 12/21/2019 - [place] intertrigo fluconazole once daily for 7 days. Continue Repatha. Excellent response to therapy. Now at goal.  09/18/2019 - Defer management of Diabetes to Dr. Tamala Julian. Continue current medications.  Consult Visit: 12/24/2019 [facility] for lipids - [facility] grant for [place].  12/08/2019 - [facility] - increase hydralazine 100 mg tid. Start Farxiga 10 mg daily.  10/13/2019 - monitor bp at home. Continue current therapy.  09/23/2019 - Optometry - eye exam.  09/22/2019 - Nephrology - Weight loss advised. Work on blood pressure control.  09/17/2019 - Dr. Debara Pickett Cardio for Lipids - resume Repatha. Referred to [facility].  Medications: Outpatient Encounter Medications as of 02/15/2020  Medication Sig Note  . allopurinol (ZYLOPRIM) 100 MG tablet Take 2 tablets (200 mg total) by mouth daily.   Marland Kitchen aspirin 81 MG chewable tablet Chew 81 mg by mouth daily.   . carvedilol (COREG) 6.25 MG tablet Take 1.5 tablets (9.375 mg total) by mouth 2 (two) times daily.   . clopidogrel (PLAVIX) 75 MG tablet Take 1 tablet (75 mg total) by mouth at bedtime.   . dapagliflozin propanediol (FARXIGA) 10 MG TABS tablet Take 1 tablet (10 mg total) by mouth daily before breakfast.   . Evolocumab (REPATHA SURECLICK) 947 MG/ML SOAJ Inject 1 Dose into the skin every 14 (fourteen) days.   Marland Kitchen ezetimibe (ZETIA) 10 MG tablet Take 1 tablet (10 mg total) by mouth daily.   . famotidine (PEPCID) 20 MG tablet Take 1 tablet (20 mg total) by mouth  daily.   . ferrous sulfate 325 (65 FE) MG tablet Take 325 mg by mouth 2 (two) times daily with a meal.   . hydrALAZINE (APRESOLINE) 100 MG tablet Take 1 tablet (100 mg total) by mouth 3 (three) times daily.   Marland Kitchen HYDROcodone-acetaminophen (NORCO) 7.5-325 MG tablet Take 1 tablet by mouth 3 (three) times daily as needed for moderate pain.   Marland Kitchen insulin NPH Human (HUMULIN N,NOVOLIN N) 100 UNIT/ML injection Inject 20 Units into the skin 2 (two) times daily before a meal.  03/04/2018: Novolin N  . insulin regular (NOVOLIN R,HUMULIN R) 100 units/mL injection Inject 6-12 Units into the skin 3 (three) times daily before meals. Per sliding scale 03/04/2018: Novolin R  . isosorbide mononitrate (IMDUR) 30 MG 24 hr tablet Take 1 tablet (30 mg total) by mouth daily.   Marland Kitchen latanoprost (XALATAN) 0.005 % ophthalmic solution Place 1 drop into both eyes at bedtime.   Marland Kitchen LORazepam (ATIVAN) 0.5 MG tablet Take 1 tablet (0.5 mg total) by mouth daily as needed for anxiety.   . magnesium oxide (MAG-OX) 400 MG tablet Take 2 tablets (800 mg total) by mouth daily. Take 2 (400 mg) daily=800 mg   . metolazone (ZAROXOLYN) 2.5 MG tablet TAKE 1 TABLET BY MOUTH ONCE A WEEK. TAKE EVERY WEDNESDAY   . nitroGLYCERIN (NITROSTAT) 0.4 MG SL tablet Place 1 tablet (0.4 mg total) under the tongue every 5 (five) minutes as needed for chest pain.   . Omega-3 Fatty Acids (FISH OIL PO) Take 1 capsule by mouth  daily.   . OXYGEN Inhale 2 L into the lungs at bedtime.   . Probiotic CAPS Take 1 capsule by mouth daily.   . promethazine (PHENERGAN) 25 MG tablet Take 1 tablet (25 mg total) by mouth every 6 (six) hours as needed for nausea or vomiting.   Marland Kitchen spironolactone (ALDACTONE) 25 MG tablet Take 1 tablet (25 mg total) by mouth daily.   Marland Kitchen torsemide (DEMADEX) 20 MG tablet Take 40 mg three times a day   . EPINEPHrine 0.3 mg/0.3 mL IJ SOAJ injection Use as directed for life-threatening allergic reaction.   . fluconazole (DIFLUCAN) 100 MG tablet Take 1 tablet  (100 mg total) by mouth daily. (Patient not taking: Reported on 02/15/2020)   . pravastatin (PRAVACHOL) 40 MG tablet Take 2 tablets (80 mg total) by mouth at bedtime. (Patient not taking: Reported on 02/15/2020)    No facility-administered encounter medications on file as of 02/15/2020.   Allergies  Allergen Reactions  . Naproxen Hives and Rash  . Celecoxib Other (See Comments)    Stomach pain  . Aspirin Other (See Comments)    CAN NOT TAKE DUE TO ULCERS  . Glucosamine Hives, Swelling and Other (See Comments)    Angioedema   . Metoclopramide Other (See Comments)    Facial twitching and stuttering  . Metolazone Other (See Comments)    Acute renal failure  . Shellfish-Derived Products Hives, Swelling and Other (See Comments)    Angioedema   . Tramadol Nausea And Vomiting        SDOH Screenings   Alcohol Screen:   . Last Alcohol Screening Score (AUDIT):   Depression (PHQ2-9): Low Risk   . PHQ-2 Score: 0  Financial Resource Strain:   . Difficulty of Paying Living Expenses:   Food Insecurity: No Food Insecurity  . Worried About Charity fundraiser in the Last Year: Never true  . Ran Out of Food in the Last Year: Never true  Housing: Low Risk   . Last Housing Risk Score: 0  Physical Activity: Insufficiently Active  . Days of Exercise per Week: 3 days  . Minutes of Exercise per Session: 40 min  Social Connections:   . Frequency of Communication with Friends and Family:   . Frequency of Social Gatherings with Friends and Family:   . Attends Religious Services:   . Active Member of Clubs or Organizations:   . Attends Archivist Meetings:   Marland Kitchen Marital Status:   Stress:   . Feeling of Stress :   Tobacco Use: Medium Risk  . Smoking Tobacco Use: Former Smoker  . Smokeless Tobacco Use: Never Used  Transportation Needs:   . Film/video editor (Medical):   Marland Kitchen Lack of Transportation (Non-Medical):     Current Diagnosis/Assessment:  Goals Addressed             This Visit's Progress   . Pharmacy Care Plan       CARE PLAN ENTRY (see longitudinal plan of care for additional care plan information)  Current Barriers:  . Chronic Disease Management support, education, and care coordination needs related to Hypertension, Hyperlipidemia, and Diabetes   Hypertension BP Readings from Last 3 Encounters:  12/24/19 132/68  12/21/19 (!) 142/70  12/08/19 (!) 180/52   . Pharmacist Clinical Goal(s): o Over the next 90 days, patient will work with PharmD and providers to maintain BP goal <130/80 . Current regimen:  o Hydralazine 100 mg tid  . Interventions: o Continue healthy diet each day.  o Keep up the good work riding exercise bike with goal of 150 minutes each week.  . Patient self care activities - Over the next 90 days, patient will: o Check BP daily, document, and provide at future appointments o Ensure daily salt intake < 2300 mg/day  Hyperlipidemia Lab Results  Component Value Date/Time   LDLCALC 56 12/21/2019 08:36 AM   . Pharmacist Clinical Goal(s): o Over the next 90 days, patient will work with PharmD and providers to maintain LDL goal < 70 . Current regimen:  o Repatha 140 mg/ml q14 days o Ezetimibe 10 mg daily o Omega 3 fatty acids daily . Interventions: o Pharmacist contacted Dr. Debara Pickett to determine if patient needed to resume statin. Her insurance sent a letter that they would no longer cover pravastatin and patient ran out.  . Patient self care activities - Over the next 90 days, patient will: o Continue to take medications as prescribed.  o Contact pharmacist or provider with any questions or conerns.   Diabetes No results found for: HGBA1C . Pharmacist Clinical Goal(s): o Over the next 90 days, patient will work with PharmD and providers to maintain A1c goal <7% . Current regimen:  o Farxiga 10 mg daily before breakfast o Humulin N 20 units twice daily before a meal o Humulin R 6-12 units three times daily before meals.    . Interventions: o Encouraged patient to keep up the good work with riding exercise bike.  o Discussed regular meal schedule to avoid low blood sugar.  o Discussed appropriate ways to correct low blood sugar.  o Discussed keeping up the good work with healthy diet and consistent meal times.  . Patient self care activities - Over the next 90 days, patient will: o Check blood sugar 3-4 times daily, document, and provide at future appointments o Contact provider with any episodes of hypoglycemia  Medication management . Pharmacist Clinical Goal(s): o Over the next 90 days, patient will work with PharmD and providers to maintain optimal medication adherence . Current pharmacy: [facility] . Interventions o Comprehensive medication review performed. o Continue current medication management strategy . Patient self care activities - Over the next 90 days, patient will: o Focus on medication adherence by continuing to use pill box.  o Take medications as prescribed o Report any questions or concerns to PharmD and/or provider(s)  Initial goal documentation         Diabetes   Recent Relevant Labs: No results found for: HGBA1C, EGFR, MICROALBUR  Last A1C per patient 6.1%  Checking BG: Daily  Recent FBG Readings: 80-94 normally after later night birthday celebration it was 103 mg/dL  Recent HS BG readings: 150-160 mg/dL Patient has failed these meds in past: none reported Patient is currently controlled on the following medications:   Farxiga 10 mg daily before breakfast  Humulin N 20 units twice daily before a meal  Humulin R 6-12 units into the skin tid before meals per sliding scale  Last diabetic Foot exam: 11/2019  Last diabetic Eye exam:   Lab Results  Component Value Date/Time   HMDIABEYEEXA Retinopathy (A) 07/22/2018 12:00 AM     We discussed: diet and exercise extensively Patient has maintained diabetes well. Neuropathy flares up when she is on her legs  more. Tries to ride exercise bike 30-45 minutes daily. Patient is prone to falls and is concerned about walking during the day while her husband is at work. Her husband purchased her exercise bike for safer  exercising.   Since starting Farxiga from cardiology she has had 2 very low sugar readings. She got off her scheduled lunch time those two days and had a drop. Her readings were 41 and 46 within the last month. She has done well otherwise. She checks blood sugar four times daily or more if feeling worse. Home blood sugars have been well controlled otherwise. She has been more disciplined with her eating schedule which has diminished the low blood sugar readings.   When she began seeing Dr. Tamala Julian her a1c was 13%. She had gone a period of time without insurance. She had been unable to check or take blood sugar. She works to bring up blood sugar when asymptomatic but lower by eating peanut butter crackers or 1/2 a peanut butter sandwich. She corrects symptomatic lower blood sugar with symptoms with 1/2 a cup of juice. If traveling she keeps a cooler with some snacks and also keeps glucose tablets.   Normal diet consists of: breakfast- oatmeal, scrambled egg/wheat toast, coffee, milk, maybe small amount of juice. Avoids breakfast meats due to CHF. Dinner leftovers are typically used for lunch (tilapia and 1/2  Baked potato). Stuffed chicken/baked chicken/fish for supper. Plans out weekly menu. Has an air fryer that she is learning to use. Occasionally eats red meat but not regularly.She tries to exercise daily if she can on her exercise bike.    Plan  Continue current medications,   Hyperlipidemia/CAD - Managed by Dr. Debara Pickett   LDL goal < 70  Lipid Panel     Component Value Date/Time   CHOL 127 12/21/2019 0836   TRIG 124 12/21/2019 0836   HDL 49 12/21/2019 0836   LDLCALC 56 12/21/2019 0836    Hepatic Function Latest Ref Rng & Units 12/21/2019 09/18/2019 12/15/2018  Total Protein 6.0 - 8.5 g/dL 6.9  6.9 7.8  Albumin 3.8 - 4.9 g/dL 3.5(L) 3.9 3.1(L)  AST 0 - 40 IU/L 20 15 16   ALT 0 - 32 IU/L 15 - 18  Alk Phosphatase 48 - 121 IU/L 168(H) 162(H) 145(H)  Total Bilirubin 0.0 - 1.2 mg/dL 0.2 <0.2 0.5  Bilirubin, Direct 0.1 - 0.5 mg/dL - - -     The ASCVD Risk score ([place]., et al., 2013) failed to calculate for the following reasons:   The patient has a prior MI or stroke diagnosis   Patient has failed these meds in past: none reported Patient is currently controlled on the following medications:  . Repatha 140 mg/ml q14 days . Ezetimibe 10 mg daily . Clopidogrel 75 mg qhs . Omega 3 fatty acids daily . Aspirin 81 mg daily  We discussed:  diet and exercise extensively. Patient stopped pravastatin due to insurance stopped covering. She forgot to mention the statin issue. Her numbers are down to normal range with Repatha. She will consider resuming a statin if her levels climb. Pharmacist left a message with Dr. Lysbeth Penner office for guidance on resuming a statin.   Plan  Continue current medications and follow Dr. Lysbeth Penner decision on resuming statin.    Heart Failure   Type: Diastolic  Last ejection fraction: 60-65% NYHA Class: III (marked limitation of activity)  Patient has failed these meds in past: none reported.  Patient is currently controlled on the following medications:   Carvedilol 9.375 mg bid  Isosorbide MN 30 mg daily  Metolazone 2.5 mg weekly on Wednesdays  Spirnolactone 25 mg daily  Torsemide 20 mg tid  We discussed diet and exercise extensively.  Checks blood pressure and weight daily. Has a heart sensor for CHF and transmits readings to heart failure clinic. The heart failure clinic calls with diuretic adjustments based on readings. Her symptoms wouldn't show up previously until she was to the point of needing hospital admission. Cardiology started [place] to improve heart and blood sugars. She has yeast infections on her skin that come and go due to this  medication. She works to dry her skin well after bathing and used drying powders.    Plan  Continue current medications  and  Hypertension   BP today is:  <130/80  Office blood pressures are  BP Readings from Last 3 Encounters:  12/24/19 132/68  12/21/19 (!) 142/70  12/08/19 (!) 180/52    Patient has failed these meds in the past: none reported Patient is currently controlled on the following medications:   Hydralazine 200 mg tid   Patient checks BP at home daily  Patient home BP readings are ranging: 116-130/70  We discussed diet and exercise extensively Patient reports that she takes 200 mg tid of hydralazine.  Plan  Continue current medications   Anemia   CBC    Component Value Date/Time   WBC 11.4 (H) 12/21/2019 0833   WBC 11.6 (H) 12/15/2018 1142   RBC 3.49 (L) 12/21/2019 0833   RBC 3.74 (L) 12/15/2018 1142   HGB 9.0 (L) 12/21/2019 0833   HCT 28.9 (L) 12/21/2019 0833   PLT 339 12/21/2019 0833   MCV 83 12/21/2019 0833   MCH 25.8 (L) 12/21/2019 0833   MCH 26.2 12/15/2018 1142   MCHC 31.1 (L) 12/21/2019 0833   MCHC 30.5 12/15/2018 1142   RDW 15.2 12/21/2019 0833   LYMPHSABS 2.3 12/21/2019 0833   MONOABS 0.8 02/11/2018 1437   EOSABS 0.2 12/21/2019 0833   BASOSABS 0.1 12/21/2019 4656    Patient has failed these meds in past: none reported Patient is currently controlled on the following medications:  . Ferrous sulfate 325 mg bid with a meal  We discussed:  Occasionally has to reduce to once daily if bowel movements are worsened. Patient also has gastroparesis. Her normal pattern is every 3 days. If she cuts back to 1 iron daily, it will get back on normal schedule. She notes some fatigue at times but may be due to the heat. She has blood transfusions 3 different times in the past.   Plan  Continue current medications    Chronic Pain Syndrome   Patient has failed these meds in past: none reported Patient is currently controlled on the following  medications:  . Hydrocodone-acetaminophen 7.5-325 mg tid prn moderate pain  We discussed:  Takes as needed. The weather has a big impact on her pain. When the weather is bad (rainy, high barometric pressure or cold) she takes three times daily. On bad days it involves both legs and lower back. She is not very active but does try to exercise on bike daily if she can. Bad leg and back pain sometimes cause her to have to rest in the recliner through the day.   Plan  Continue current medications   Gout   Patient has failed these meds in past: n/a Patient is currently controlled on the following medications:  . Allopurinol 200 mg daily  We discussed:  Takes allopurinol for gout prevention. Has had gout flare but didn't know what was happening. Thought it was due to neuropathy. Patient has had 2 toes amputated and has severe swelling in legs for last  4 years.   Plan  Continue current medications   GERD   Patient has failed these meds in past: Dexilant, pantoprazole Patient is currently controlled on the following medications:  . Famotidine 20 mg daily  We discussed:  Patient reports good control on OTC famotidine.   Plan  Continue current medications     Depression/Anxiety   Patient has failed these meds in past: none reported Patient is currently controlled on the following medications:  . Lorazepam 0.5 mg daily prn anxiety  We discussed:  Using once daily. 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 Has to drive to [facility] to facility to calm her down. Can't make consistent plans due to uncertainty of their needs each day. She reports good control with treatment.   Plan  Continue current medications   Health Maintenance   Patient is currently controlled on the following medications:  . Epinephrine 0.3 mg/0.79m prn for life threatening allergic reaction . Nitroglycerin prn cheat pain/angina . Magnesium 800 mg daily - low  magnesium . Probiotic caps daily - general health . Promethazine 25 mg q6h prn nausea or vomiting . Latanoprost 0.005% eye drops both eyes at bedtime - glaucoma  We discussed:  Patient has gastroparesis. When it flares up she has to take promethazine as needed. Overall well controlled and rarely using promethazine.   Plan  Continue current medications  Vaccines   Reviewed and discussed patient's vaccination history. Has had both Moderna shots 03/10 [facility] Patient has not had a Shingrix vaccine due to cost ($175). Encouraged her to check with [facility] with updated cost on insurance. It has been a few years since she checked on coverage.   Immunization History  Administered Date(s) Administered  . Influenza, Quadrivalent, Recombinant, Inj, Pf 05/21/2018  . Influenza,inj,Quad PF,6+ Mos 03/28/2015  . Influenza-Unspecified 04/15/2014, 05/16/2016, 03/12/2019  . Pneumococcal Conjugate-13 05/16/2014    Plan  Recommended patient receive annual flu vaccine in office.   Medication Management   [facility] Uses pill box? Yes Pt endorses excellent compliance  We discussed: Patient has good medication compliance. She is knowledgeable of her medications and how she takes them. She follows up with PCP and specialists regularly.   Plan  Continue current medication management strategy    Follow up: 3 month phone visit

## 2020-02-14 NOTE — Patient Instructions (Addendum)
Visit Information  Thank you for your time discussing your medications. I look forward to working with you to achieve your health care goals. Below is a summary of what we talked about during our visit.   Goals Addressed   None     Ms. Ferrante was given information about Chronic Care Management services today including:  1. CCM service includes personalized support from designated clinical staff supervised by her physician, including individualized plan of care and coordination with other care providers 2. 24/7 contact phone numbers for assistance for urgent and routine care needs. 3. Standard insurance, coinsurance, copays and deductibles apply for chronic care management only during months in which we provide at least 20 minutes of these services. Most insurances cover these services at 100%, however patients may be responsible for any copay, coinsurance and/or deductible if applicable. This service may help you avoid the need for more expensive face-to-face services. 4. Only one practitioner may furnish and bill the service in a calendar month. 5. The patient may stop CCM services at any time (effective at the end of the month) by phone call to the office staff.  Patient agreed to services and verbal consent obtained.   The patient verbalized understanding of instructions provided today and agreed to receive a mailed copy of patient instruction and/or educational materials. Telephone follow up appointment with pharmacy team member scheduled for: 05/2020  Sherre Poot, PharmD Clinical Pharmacist [facility] [phone] (office) [phone] (mobile)  Exercises To Do While Sitting  Exercises that you do while sitting (chair exercises) can give you many of the same benefits as full exercise. Benefits include strengthening your heart, burning calories, and keeping muscles and joints healthy. Exercise can also improve your mood and help with depression and anxiety. You may  benefit from chair exercises if you are unable to do standing exercises because of:  Diabetic foot pain.  Obesity.  Illness.  Arthritis.  Recovery from surgery or injury.  Breathing problems.  Balance problems.  Another type of disability. Before starting chair exercises, check with your health care provider or a physical therapist to find out how much exercise you can tolerate and which exercises are safe for you. If your health care provider approves:  Start out slowly and build up over time. Aim to work up to about 10-20 minutes for each exercise session.  Make exercise part of your daily routine.  Drink water when you exercise. Do not wait until you are thirsty. Drink every 10-15 minutes.  Stop exercising right away if you have pain, nausea, shortness of breath, or dizziness.  If you are exercising in a wheelchair, make sure to lock the wheels.  Ask your health care provider whether you can do tai chi or yoga. Many positions in these mind-body exercises can be modified to do while seated. Warm-up Before starting other exercises: 1. Sit up as straight as you can. Have your knees bent at 90 degrees, which is the shape of the capital letter "L." Keep your feet flat on the floor. 2. Sit at the front edge of your chair, if you can. 3. Pull in (tighten) the muscles in your abdomen and stretch your spine and neck as straight as you can. Hold this position for a few minutes. 4. Breathe in and out evenly. Try to concentrate on your breathing, and relax your mind. Stretching Exercise A: Arm stretch 1. Hold your arms out straight in front of your body. 2. Bend your hands at the wrist with  your fingers pointing up, as if signaling someone to stop. Notice the slight tension in your forearms as you hold the position. 3. Keeping your arms out and your hands bent, rotate your hands outward as far as you can and hold this stretch. Aim to have your thumbs pointing up and your pinkie fingers  pointing down. Slowly repeat arm stretches for one minute as tolerated. Exercise B: Leg stretch 1. If you can move your legs, try to "draw" letters on the floor with the toes of your foot. Write your name with one foot. 2. Write your name with the toes of your other foot. Slowly repeat the movements for one minute as tolerated. Exercise C: Reach for the sky 1. Reach your hands as far over your head as you can to stretch your spine. 2. Move your hands and arms as if you are climbing a rope. Slowly repeat the movements for one minute as tolerated. Range of motion exercises Exercise A: Shoulder roll 1. Let your arms hang loosely at your sides. 2. Lift just your shoulders up toward your ears, then let them relax back down. 3. When your shoulders feel loose, rotate your shoulders in backward and forward circles. Do shoulder rolls slowly for one minute as tolerated. Exercise B: March in place 1. As if you are marching, pump your arms and lift your legs up and down. Lift your knees as high as you can. ? If you are unable to lift your knees, just pump your arms and move your ankles and feet up and down. March in place for one minute as tolerated. Exercise C: Seated jumping jacks 1. Let your arms hang down straight. 2. Keeping your arms straight, lift them up over your head. Aim to point your fingers to the ceiling. 3. While you lift your arms, straighten your legs and slide your heels along the floor to your sides, as wide as you can. 4. As you bring your arms back down to your sides, slide your legs back together. ? If you are unable to use your legs, just move your arms. Slowly repeat seated jumping jacks for one minute as tolerated. Strengthening exercises Exercise A: Shoulder squeeze 1. Hold your arms straight out from your body to your sides, with your elbows bent and your fists pointed at the ceiling. 2. Keeping your arms in the bent position, move them forward so your elbows and forearms  meet in front of your face. 3. Open your arms back out as wide as you can with your elbows still bent, until you feel your shoulder blades squeezing together. Hold for 5 seconds. Slowly repeat the movements forward and backward for one minute as tolerated. Contact a health care provider if you:  Had to stop exercising due to any of the following: ? Pain. ? Nausea. ? Shortness of breath. ? Dizziness. ? Fatigue.  Have significant pain or soreness after exercising. Get help right away if you have:  Chest pain.  Difficulty breathing. These symptoms may represent a serious problem that is an emergency. Do not wait to see if the symptoms will go away. Get medical help right away. Call your local emergency services (911 in the [place] Do not drive yourself to the hospital. This information is not intended to replace advice given to you by your health care provider. Make sure you discuss any questions you have with your health care provider. Document Revised: 10/23/2018 Document Reviewed: 05/15/2017 Elsevier Patient Education  2020 [employer].

## 2020-02-15 ENCOUNTER — Telehealth: Payer: Self-pay | Admitting: Internal Medicine

## 2020-02-15 ENCOUNTER — Ambulatory Visit: Payer: Medicare Other

## 2020-02-15 ENCOUNTER — Other Ambulatory Visit: Payer: Self-pay

## 2020-02-15 DIAGNOSIS — E782 Mixed hyperlipidemia: Secondary | ICD-10-CM

## 2020-02-15 DIAGNOSIS — I130 Hypertensive heart and chronic kidney disease with heart failure and stage 1 through stage 4 chronic kidney disease, or unspecified chronic kidney disease: Secondary | ICD-10-CM

## 2020-02-15 DIAGNOSIS — E1142 Type 2 diabetes mellitus with diabetic polyneuropathy: Secondary | ICD-10-CM

## 2020-02-15 MED ORDER — CARVEDILOL 6.25 MG PO TABS: 9.3750 mg | ORAL_TABLET | Freq: Two times a day (BID) | ORAL | 3 refills | Status: DC

## 2020-02-15 NOTE — Telephone Encounter (Signed)
     I went on pt's chart to see who had called her about lab results. It was [place] , she said she would call pt back.

## 2020-02-15 NOTE — Telephone Encounter (Signed)
Attempted to call patient to discuss. Left message to call back.

## 2020-02-15 NOTE — Telephone Encounter (Signed)
    Pt c/o medication issue:  1. Name of Medication: pravastatin (PRAVACHOL) 40 MG tablet  2. How are you currently taking this medication (dosage and times per day)? Take 2 tablets (80 mg total) by mouth at bedtime.Patient not taking: Reported on 02/15/2020  3. Are you having a reaction (difficulty breathing--STAT)?   4. What is your medication issue? Judson Roch from [employer]. She said she spoke with pt and found out pt no longer taking her pravastatin she wanted to make sure Dr. Debara Pickett is aware. According to pt pravastatin is getting expensive. She said if Dr. Debara Pickett wants her to keep taking pravastatin she will do that, or if pt can take lipitor or crestor instead as a cheaper alternatives. She said a nurse can call her to discuss

## 2020-02-18 NOTE — Telephone Encounter (Signed)
Spoke with pt, she reports the insurance will no longer cover the pravastatin. According to the patient she has never taken lipitor or crestor. Aware dr Debara Pickett is out of the office this week but will forward for his review and advise.

## 2020-02-19 NOTE — Telephone Encounter (Signed)
Attempted to call Judson Roch Bet @ [facility] (which is not a pharmacy listed on patient's profile) to discuss cost. There was no answer.   Will wait on MD to reply regarding statin and possible change in therapy.

## 2020-02-22 ENCOUNTER — Telehealth: Payer: Self-pay | Admitting: Internal Medicine

## 2020-02-22 NOTE — Telephone Encounter (Signed)
Well that is good news - then keep doing what she is doing off statin.  Dr Lemmie Evens

## 2020-02-22 NOTE — Telephone Encounter (Signed)
Spoke with patient about statin issue. She reports pravastatin is not on covered drug list per a letter she received from [employer]. She said the pharmacist with Dr. Tobie Poet (PCP) office had called in about this on her behalf. Patient has been OFF statin for 4-5 months - labs from 12/21/19 in epic are OFF statin - LDL was 57.   Will notify MD

## 2020-02-22 NOTE — Telephone Encounter (Signed)
Patient aware OK to remain off statin per MD. Removed from med list

## 2020-02-22 NOTE — Telephone Encounter (Signed)
PA for repatha submitted via [facility] Request Reference Number: [id number]. REPATHA SURE INJ 140MG /ML is approved through 07/15/2020

## 2020-02-22 NOTE — Telephone Encounter (Signed)
That's ridiculous that insurance won't cover it - I think it is on the $4 list at [facility] and [facility] - she may want to try there and pay out of pocket.  Dr Lemmie Evens

## 2020-02-25 ENCOUNTER — Other Ambulatory Visit: Payer: Self-pay

## 2020-02-25 MED ORDER — LORAZEPAM 0.5 MG PO TABS: 0.5000 mg | ORAL_TABLET | Freq: Every day | ORAL | 2 refills | Status: DC | PRN

## 2020-02-26 DIAGNOSIS — R509 Fever, unspecified: Secondary | ICD-10-CM | POA: Diagnosis not present

## 2020-02-26 DIAGNOSIS — N3000 Acute cystitis without hematuria: Secondary | ICD-10-CM | POA: Diagnosis not present

## 2020-02-26 DIAGNOSIS — N3091 Cystitis, unspecified with hematuria: Secondary | ICD-10-CM | POA: Diagnosis not present

## 2020-03-08 ENCOUNTER — Other Ambulatory Visit: Payer: Self-pay

## 2020-03-08 MED ORDER — PROMETHAZINE HCL 25 MG PO TABS: 25.0000 mg | ORAL_TABLET | Freq: Four times a day (QID) | ORAL | 1 refills | Status: DC | PRN

## 2020-03-14 ENCOUNTER — Other Ambulatory Visit: Payer: Self-pay

## 2020-03-14 DIAGNOSIS — G4733 Obstructive sleep apnea (adult) (pediatric): Secondary | ICD-10-CM | POA: Diagnosis not present

## 2020-03-14 MED ORDER — HYDROCODONE-ACETAMINOPHEN 7.5-325 MG PO TABS: 1.0000 | ORAL_TABLET | Freq: Three times a day (TID) | ORAL | 0 refills | Status: DC | PRN

## 2020-03-15 ENCOUNTER — Other Ambulatory Visit (HOSPITAL_COMMUNITY): Payer: Self-pay | Admitting: Internal Medicine

## 2020-03-18 ENCOUNTER — Ambulatory Visit (INDEPENDENT_AMBULATORY_CARE_PROVIDER_SITE_OTHER): Payer: Medicare Other | Admitting: Family Medicine

## 2020-03-18 ENCOUNTER — Other Ambulatory Visit: Payer: Self-pay | Admitting: Family Medicine

## 2020-03-18 ENCOUNTER — Other Ambulatory Visit: Payer: Self-pay

## 2020-03-18 VITALS — BP 160/60 | HR 72 | Temp 97.7°F | Resp 18 | Ht 64.5 in | Wt 259.0 lb

## 2020-03-18 DIAGNOSIS — S9032XA Contusion of left foot, initial encounter: Secondary | ICD-10-CM | POA: Diagnosis not present

## 2020-03-18 MED ORDER — CIPROFLOXACIN HCL 250 MG PO TABS: 250.0000 mg | ORAL_TABLET | Freq: Two times a day (BID) | ORAL | 0 refills | Status: DC

## 2020-03-18 NOTE — Progress Notes (Signed)
Acute Office Visit  Subjective:    Patient ID: Dana [name], female    DOB: 13-Apr-1961, 59 y.o.   MRN: [id number]  Chief Complaint  Patient presents with  . blister left foot    HPI Patient is in today for a bruise/blister on left foot. Denies fever. Denies any specific trauma. She does note that she was on a stepstool to put things up in cabinets.  Past Medical History:  Diagnosis Date  . Acquired absence of left great toe ([place])   . Acquired absence of other left toe(s) ([place])   . Anginal pain ([place])   . Atherosclerotic heart disease of native coronary artery without angina pectoris   . CAD (coronary artery disease)    08/2016 DES to RCA, also with history of LAD stenting.   . Chronic diastolic (congestive) heart failure ([place])   . Chronic kidney disease, stage 2 (mild)   . Chronic systolic (congestive) heart failure ([place])   . Diabetes ([place])   . Diastolic CHF ([place])   . Gastro-esophageal reflux disease without esophagitis   . Hyperlipidemia   . Hypertension   . Hypertensive heart disease with heart failure ([place])   . Hypoxemia   . Idiopathic gout, multiple sites   . Localized edema   . Low back pain   . Mixed hyperlipidemia   . Mixed incontinence   . Morbid (severe) obesity due to excess calories ([place])   . OSA (obstructive sleep apnea)   . Other specified hypothyroidism   . Primary insomnia   . Type 2 diabetes mellitus with diabetic nephropathy [facility])     Past Surgical History:  Procedure Laterality Date  . ABDOMINAL HYSTERECTOMY     Partial- Still has both ovaries  . CARDIOVASCULAR STRESS TEST  08/13/2014   Nuclear; Normal  . CARPAL TUNNEL RELEASE    . CATARACT EXTRACTION, BILATERAL    . CESAREAN SECTION    . CHOLECYSTECTOMY    . CORONARY ANGIOPLASTY WITH STENT PLACEMENT     3 blockages/ 1 stent  . EYE SURGERY    . RIGHT/LEFT HEART CATH AND CORONARY ANGIOGRAPHY N/A 01/07/2017   Procedure: Right/Left Heart Cath and Coronary Angiography;  Surgeon: Larey Dresser, MD;  Location: [facility];  Service: Cardiovascular;  Laterality: N/A;  . ROTATOR CUFF REPAIR    . TOE AMPUTATION Left   . US ECHOCARDIOGRAPHY  05/2017   Normal    Family History  Problem Relation Age of Onset  . Hypertension Father   . Coronary artery disease Mother   . Hypertension Mother   . Hyperlipidemia Mother   . Diabetes type II Mother   . Breast cancer Mother   . Lung cancer Mother   . Bipolar disorder Other   . Depression Other   . Schizophrenia Other     Social History   Socioeconomic History  . Marital status: Married    Spouse name: Not on file  . Number of children: Not on file  . Years of education: Not on file  . Highest education level: Not on file  Occupational History  . Not on file  Tobacco Use  . Smoking status: Former Research scientist (life sciences)  . Smokeless tobacco: Never Used  Vaping Use  . Vaping Use: Never used  Substance and Sexual Activity  . Alcohol use: Not Currently  . Drug use: Not Currently  . Sexual activity: Not on file  Other Topics Concern  . Not on file  Social History Narrative  .  Not on file   Social Determinants of Health   Financial Resource Strain:   . Difficulty of Paying Living Expenses: Not on file  Food Insecurity: No Food Insecurity  . Worried About Charity fundraiser in the Last Year: Never true  . Ran Out of Food in the Last Year: Never true  Transportation Needs:   . Lack of Transportation (Medical): Not on file  . Lack of Transportation (Non-Medical): Not on file  Physical Activity: Insufficiently Active  . Days of Exercise per Week: 3 days  . Minutes of Exercise per Session: 40 min  Stress:   . Feeling of Stress : Not on file  Social Connections:   . Frequency of Communication with Friends and Family: Not on file  . Frequency of Social Gatherings with Friends and Family: Not on file  . Attends Religious Services: Not on file  . Active Member of Clubs or Organizations: Not on file  . Attends Theatre manager Meetings: Not on file  . Marital Status: Not on file  Intimate Partner Violence:   . Fear of Current or Ex-Partner: Not on file  . Emotionally Abused: Not on file  . Physically Abused: Not on file  . Sexually Abused: Not on file    Outpatient Medications Prior to Visit  Medication Sig Dispense Refill  . allopurinol (ZYLOPRIM) 100 MG tablet Take 2 tablets (200 mg total) by mouth daily. 180 tablet 3  . aspirin 81 MG chewable tablet Chew 81 mg by mouth daily.    . carvedilol (COREG) 6.25 MG tablet Take 1.5 tablets (9.375 mg total) by mouth 2 (two) times daily. 90 tablet 3  . clopidogrel (PLAVIX) 75 MG tablet Take 1 tablet (75 mg total) by mouth at bedtime. 90 tablet 3  . dapagliflozin propanediol (FARXIGA) 10 MG TABS tablet Take 1 tablet (10 mg total) by mouth daily before breakfast. 30 tablet 11  . EPINEPHrine 0.3 mg/0.3 mL IJ SOAJ injection Use as directed for life-threatening allergic reaction. 4 Device 3  . Evolocumab (REPATHA SURECLICK) 812 MG/ML SOAJ Inject 1 Dose into the skin every 14 (fourteen) days. 2 pen 11  . ezetimibe (ZETIA) 10 MG tablet Take 1 tablet (10 mg total) by mouth daily. 90 tablet 3  . famotidine (PEPCID) 20 MG tablet Take 1 tablet (20 mg total) by mouth daily. 30 tablet 5  . ferrous sulfate 325 (65 FE) MG tablet Take 325 mg by mouth 2 (two) times daily with a meal.    . hydrALAZINE (APRESOLINE) 100 MG tablet Take 1 tablet (100 mg total) by mouth 3 (three) times daily. 270 tablet 3  . HYDROcodone-acetaminophen (NORCO) 7.5-325 MG tablet Take 1 tablet by mouth 3 (three) times daily as needed for moderate pain. 90 tablet 0  . insulin NPH Human (HUMULIN N,NOVOLIN N) 100 UNIT/ML injection Inject 20 Units into the skin 2 (two) times daily before a meal.     . insulin regular (NOVOLIN R,HUMULIN R) 100 units/mL injection Inject 6-12 Units into the skin 3 (three) times daily before meals. Per sliding scale    . isosorbide mononitrate (IMDUR) 30 MG 24 hr tablet Take 1  tablet (30 mg total) by mouth daily. 90 tablet 3  . latanoprost (XALATAN) 0.005 % ophthalmic solution Place 1 drop into both eyes at bedtime.    Marland Kitchen LORazepam (ATIVAN) 0.5 MG tablet Take 1 tablet (0.5 mg total) by mouth daily as needed for anxiety. 30 tablet 2  . magnesium oxide (MAG-OX) 400 MG  tablet Take 2 tablets (800 mg total) by mouth daily. Take 2 (400 mg) daily=800 mg 60 tablet 1  . metolazone (ZAROXOLYN) 2.5 MG tablet Take 1 tablet (2.5 mg total) by mouth once a week. On Wednesdays 13 tablet 3  . nitroGLYCERIN (NITROSTAT) 0.4 MG SL tablet Place 1 tablet (0.4 mg total) under the tongue every 5 (five) minutes as needed for chest pain. 15 tablet 2  . Omega-3 Fatty Acids (FISH OIL PO) Take 1 capsule by mouth daily.    . OXYGEN Inhale 2 L into the lungs at bedtime.    . Probiotic CAPS Take 1 capsule by mouth daily.    . promethazine (PHENERGAN) 25 MG tablet Take 1 tablet (25 mg total) by mouth every 6 (six) hours as needed for nausea or vomiting. 90 tablet 1  . spironolactone (ALDACTONE) 25 MG tablet Take 1 tablet (25 mg total) by mouth daily. 30 tablet 3  . torsemide (DEMADEX) 20 MG tablet Take 40 mg three times a day 180 tablet 3  . fluconazole (DIFLUCAN) 100 MG tablet Take 1 tablet (100 mg total) by mouth daily. (Patient not taking: Reported on 02/15/2020) 7 tablet 0   No facility-administered medications prior to visit.    Allergies  Allergen Reactions  . Naproxen Hives and Rash  . Celecoxib Other (See Comments)    Stomach pain  . Aspirin Other (See Comments)    CAN NOT TAKE DUE TO ULCERS  . Glucosamine Hives, Swelling and Other (See Comments)    Angioedema   . Metoclopramide Other (See Comments)    Facial twitching and stuttering  . Metolazone Other (See Comments)    Acute renal failure  . Shellfish-Derived Products Hives, Swelling and Other (See Comments)    Angioedema   . Tramadol Nausea And Vomiting         Review of Systems  Constitutional: Negative for fever.    Neurological: Positive for numbness (Bl feet up to mid calf.).       Objective:    Physical Exam Vitals reviewed.  Constitutional:      Appearance: Normal appearance. She is obese.  Skin:    Findings: Bruising (Left foot plantar surface of first MTP proximal to great toe amputation.) present.     Comments: No fluctuance. Unable to assess tenderness.   Neurological:     Mental Status: She is alert.     BP (!) 160/60   Pulse 72   Temp 97.7 F (36.5 C)   Resp 18   Ht 5' 4.5" (1.638 m)   Wt 259 lb (117.5 kg)   BMI 43.77 kg/m  Wt Readings from Last 3 Encounters:  03/23/20 256 lb (116.1 kg)  03/18/20 259 lb (117.5 kg)  12/24/19 260 lb (117.9 kg)    Health Maintenance Due  Topic Date Due  . HEMOGLOBIN A1C  Never done  . Hepatitis C Screening  Never done  . PNEUMOCOCCAL POLYSACCHARIDE VACCINE AGE 70-64 HIGH RISK  Never done  . FOOT EXAM  Never done  . COVID-19 Vaccine (1) Never done  . TETANUS/TDAP  Never done  . PAP SMEAR-Modifier  Never done  . OPHTHALMOLOGY EXAM  07/23/2019    There are no preventive care reminders to display for this patient.   No results found for: TSH Lab Results  Component Value Date   WBC 11.4 (H) 12/21/2019   HGB 9.0 (L) 12/21/2019   HCT 28.9 (L) 12/21/2019   MCV 83 12/21/2019   PLT 339 12/21/2019  Lab Results  Component Value Date   NA 140 12/21/2019   K 4.3 12/21/2019   CO2 22 12/21/2019   GLUCOSE 120 (H) 12/21/2019   BUN 23 12/21/2019   CREATININE 1.35 (H) 12/21/2019   BILITOT 0.2 12/21/2019   ALKPHOS 168 (H) 12/21/2019   AST 20 12/21/2019   ALT 15 12/21/2019   PROT 6.9 12/21/2019   ALBUMIN 3.5 (L) 12/21/2019   CALCIUM 8.7 12/21/2019   ANIONGAP 10 05/05/2019   Lab Results  Component Value Date   CHOL 127 12/21/2019   Lab Results  Component Value Date   HDL 49 12/21/2019   Lab Results  Component Value Date   LDLCALC 56 12/21/2019   Lab Results  Component Value Date   TRIG 124 12/21/2019   Lab Results   Component Value Date   CHOLHDL 2.6 12/21/2019   No results found for: HGBA1C     Assessment & Plan:  1. Bruised sole of foot, left, initial encounter  In case infection setting in under bruise, will give cipro bid.  Monitor for worsening symptoms. .  Meds ordered this encounter  Medications  . ciprofloxacin (CIPRO) 250 MG tablet    Sig: Take 1 tablet (250 mg total) by mouth 2 (two) times daily.    Dispense:  14 tablet    Refill:  0    No orders of the defined types were placed in this encounter.    Follow-up: No follow-ups on file.  An After Visit Summary was printed and given to the patient.  [name] [facility] [phone]

## 2020-03-23 ENCOUNTER — Encounter: Payer: Self-pay | Admitting: Family Medicine

## 2020-03-23 ENCOUNTER — Other Ambulatory Visit: Payer: Self-pay

## 2020-03-23 ENCOUNTER — Ambulatory Visit: Payer: Medicare Other | Admitting: Family Medicine

## 2020-03-23 ENCOUNTER — Ambulatory Visit (INDEPENDENT_AMBULATORY_CARE_PROVIDER_SITE_OTHER): Payer: Medicare Other | Admitting: Family Medicine

## 2020-03-23 VITALS — BP 140/60 | HR 84 | Temp 97.4°F | Resp 16 | Ht 65.0 in | Wt 256.0 lb

## 2020-03-23 DIAGNOSIS — E1142 Type 2 diabetes mellitus with diabetic polyneuropathy: Secondary | ICD-10-CM | POA: Diagnosis not present

## 2020-03-23 DIAGNOSIS — K219 Gastro-esophageal reflux disease without esophagitis: Secondary | ICD-10-CM

## 2020-03-23 DIAGNOSIS — M79642 Pain in left hand: Secondary | ICD-10-CM | POA: Diagnosis not present

## 2020-03-23 DIAGNOSIS — F1120 Opioid dependence, uncomplicated: Secondary | ICD-10-CM

## 2020-03-23 DIAGNOSIS — F330 Major depressive disorder, recurrent, mild: Secondary | ICD-10-CM | POA: Diagnosis not present

## 2020-03-23 DIAGNOSIS — E782 Mixed hyperlipidemia: Secondary | ICD-10-CM | POA: Diagnosis not present

## 2020-03-23 DIAGNOSIS — M79641 Pain in right hand: Secondary | ICD-10-CM | POA: Insufficient documentation

## 2020-03-23 DIAGNOSIS — M1A9XX0 Chronic gout, unspecified, without tophus (tophi): Secondary | ICD-10-CM

## 2020-03-23 DIAGNOSIS — I130 Hypertensive heart and chronic kidney disease with heart failure and stage 1 through stage 4 chronic kidney disease, or unspecified chronic kidney disease: Secondary | ICD-10-CM | POA: Diagnosis not present

## 2020-03-23 DIAGNOSIS — J9611 Chronic respiratory failure with hypoxia: Secondary | ICD-10-CM

## 2020-03-23 DIAGNOSIS — M545 Low back pain, unspecified: Secondary | ICD-10-CM

## 2020-03-23 DIAGNOSIS — Z23 Encounter for immunization: Secondary | ICD-10-CM | POA: Diagnosis not present

## 2020-03-23 DIAGNOSIS — G894 Chronic pain syndrome: Secondary | ICD-10-CM

## 2020-03-23 NOTE — Progress Notes (Signed)
Subjective:  Patient ID: Dana [name], female    DOB: [birth year]  Age: 59 y.o. MRN: [id number]  Chief Complaint  Patient presents with  . Diabetes  . Hyperlipidemia  . Hypertension    Pt presents for follow up of hypertensive cardiovascular disease with combined CHF and CKD Stage 2, hyperlipidemia, diabetes, and GERD.  Her current cardiac medication regimen includes a diuretic (torsemide and spironolactone.), a beta-blocker (Carvedilol), Hydralazine, and plavix, baby aspirin, and repatha..  She is tolerating the medication well without side effects.  Compliance with treatment has been good; she takes her medication as directed, maintains her diet and exercise regimen, and follows up as directed.  She follows with Dr. Debara Pickett for cardiology.    Pt presents with hyperlipidemia.  Current treatment includes repatha and fish oil.  Compliance with treatment has been good; she takes her medication as directed, maintains her low cholesterol diet, follows up as directed, and maintains her exercise regimen.  She denies experiencing any hypercholesterolemia related symptoms.      Additionally, she presents with history of type 2 diabetes mellitus with diabetic polyneuropathy.  she has type 2, insulin requiring diabetes, complicated by nephropathy, retinopathy, and peripheral neuropathy.  Current meds include insulin ( NPH- 20 U twice a day; NOVOLIN R Regular U-100 SSI 6-10 U before meals ), and farxiga.  She eats healthy and exercise regimen, follows up as directed, and is keeping a glucose diary checks FBS 3-4 daily, ranges from 60-210.  Preventive care: She performs foot self-exams daily and has no sores.   Her last ophthalmology exam was in 09/2019 and the results showed proliferative retinopathy. Current diabetes symptoms include peripheral neuropathy (manifested as numbness).  She denies polydipsia, polyphagia or polyuria.  She follows with Dr. Tamala Julian, her endocrinologist. Her last a1c 6.2.  Has follow up  appointment with Nephrologist tomorrow (03/25/2020).  Complaining of Joint pain in hands, feet, and knees. Stiff in am for greater than hour.   Social History   Socioeconomic History  . Marital status: Married    Spouse name: Not on file  . Number of children: Not on file  . Years of education: Not on file  . Highest education level: Not on file  Occupational History  . Not on file  Tobacco Use  . Smoking status: Former Research scientist (life sciences)  . Smokeless tobacco: Never Used  Vaping Use  . Vaping Use: Never used  Substance and Sexual Activity  . Alcohol use: Not Currently  . Drug use: Not Currently  . Sexual activity: Not on file  Other Topics Concern  . Not on file  Social History Narrative  . Not on file   Social Determinants of Health   Financial Resource Strain:   . Difficulty of Paying Living Expenses: Not on file  Food Insecurity: No Food Insecurity  . Worried About Charity fundraiser in the Last Year: Never true  . Ran Out of Food in the Last Year: Never true  Transportation Needs:   . Lack of Transportation (Medical): Not on file  . Lack of Transportation (Non-Medical): Not on file  Physical Activity: Insufficiently Active  . Days of Exercise per Week: 3 days  . Minutes of Exercise per Session: 40 min  Stress:   . Feeling of Stress : Not on file  Social Connections:   . Frequency of Communication with Friends and Family: Not on file  . Frequency of Social Gatherings with Friends and Family: Not on file  . Attends Religious Services:  Not on file  . Active Member of Clubs or Organizations: Not on file  . Attends Archivist Meetings: Not on file  . Marital Status: Not on file   Past Medical History:  Diagnosis Date  . Acquired absence of left great toe ([place])   . Acquired absence of other left toe(s) ([place])   . Anginal pain ([place])   . Atherosclerotic heart disease of native coronary artery without angina pectoris   . CAD (coronary artery disease)    08/2016 DES  to RCA, also with history of LAD stenting.   . Chronic diastolic (congestive) heart failure ([place])   . Chronic kidney disease, stage 2 (mild)   . Chronic systolic (congestive) heart failure ([place])   . Diabetes ([place])   . Diastolic CHF ([place])   . Gastro-esophageal reflux disease without esophagitis   . Hyperlipidemia   . Hypertension   . Hypertensive heart disease with heart failure ([place])   . Hypoxemia   . Idiopathic gout, multiple sites   . Localized edema   . Low back pain   . Mixed hyperlipidemia   . Mixed incontinence   . Morbid (severe) obesity due to excess calories ([place])   . OSA (obstructive sleep apnea)   . Other specified hypothyroidism   . Primary insomnia   . Type 2 diabetes mellitus with diabetic nephropathy (HCC)    Family History  Problem Relation Age of Onset  . Hypertension Father   . Coronary artery disease Mother   . Hypertension Mother   . Hyperlipidemia Mother   . Diabetes type II Mother   . Breast cancer Mother   . Lung cancer Mother   . Bipolar disorder Other   . Depression Other   . Schizophrenia Other     Review of Systems  Constitutional: Positive for fatigue. Negative for chills and fever.  HENT: Negative for congestion, ear pain and sore throat.   Respiratory: Positive for cough and shortness of breath (occ).        With exertion  Cardiovascular: Positive for chest pain (occ. Angina-NTG once every 2 months, Cardiologisr-Dr. Eliezer Lofts) and palpitations (occ).  Gastrointestinal: Positive for constipation (intermittent diarrhea and constipation. Vomiting every few day.  ), diarrhea (Gastroparesis), nausea (Gastroparesis) and vomiting (occ). Negative for abdominal pain.  Endocrine: Negative for polydipsia, polyphagia and polyuria.  Genitourinary: Negative for dysuria and urgency.  Musculoskeletal: Positive for arthralgias (Knees and hands. Fingers stiff) and back pain (Vicodin 3 times daily). Negative for myalgias.  Skin: Positive for rash.    Neurological: Positive for weakness, numbness and headaches. Negative for dizziness.       BL peripheral neuropathy causes pain.  Psychiatric/Behavioral: Negative for dysphoric mood. The patient is not nervous/anxious.      Objective:  BP 140/60   Pulse 84   Temp (!) 97.4 F (36.3 C)   Resp 16   Ht 5\' 5"  (1.651 m)   Wt 256 lb (116.1 kg)   BMI 42.60 kg/m   BP/Weight 03/23/2020 03/18/2020 3/78/5885  Systolic BP 027 741 287  Diastolic BP 60 60 68  Wt. (Lbs) 256 259 260  BMI 42.6 43.77 43.94    Physical Exam Vitals reviewed.  Constitutional:      Appearance: Normal appearance. She is normal weight.  Cardiovascular:     Rate and Rhythm: Normal rate. Rhythm irregular.     Heart sounds: Normal heart sounds.  Pulmonary:     Effort: Pulmonary effort is normal. No respiratory distress.  Breath sounds: Normal breath sounds.  Abdominal:     General: Abdomen is flat. Bowel sounds are normal.     Palpations: Abdomen is soft.     Tenderness: There is no abdominal tenderness.  Musculoskeletal:        General: Swelling (BL hands) and tenderness (Bl hands) present.  Neurological:     Mental Status: She is alert and oriented to person, place, and time.  Psychiatric:        Mood and Affect: Mood normal.        Behavior: Behavior normal.     Lab Results  Component Value Date   WBC 11.8 (H) 03/23/2020   HGB 9.7 (L) 03/23/2020   HCT 29.8 (L) 03/23/2020   PLT 350 03/23/2020   GLUCOSE 132 (H) 03/23/2020   CHOL 147 03/23/2020   TRIG 124 03/23/2020   HDL 49 03/23/2020   LDLCALC 76 03/23/2020   ALT 19 03/23/2020   AST 16 03/23/2020   NA 139 03/23/2020   K 4.5 03/23/2020   CL 103 03/23/2020   CREATININE 1.73 (H) 03/23/2020   BUN 28 (H) 03/23/2020   CO2 20 03/23/2020   TSH 3.590 03/23/2020   INR 1.08 01/01/2017   HGBA1C 6.7 (H) 03/23/2020      Assessment & Plan:  1. Diabetic polyneuropathy associated with type 2 diabetes mellitus (HCC) Control: good. Recommend check  sugars fasting daily. Recommend check feet daily. Recommend annual eye exams. Medicines: no changes Continue to work on eating a healthy diet and exercise.  Labs drawn today.   - Hemoglobin A1c  2. Hypertensive heart and renal disease with congestive heart failure ([place]) Well controlled.  No changes to medicines.  Continue to work on eating a healthy diet and exercise.  Labs drawn today.  - CBC with Differential/Platelet - Comprehensive metabolic panel  3. Mixed hyperlipidemia Well controlled.  No changes to medicines.  Continue to work on eating a healthy diet and exercise.  Labs drawn today.  - Lipid panel  4. Depression, major, recurrent, mild ([place]) The current medical regimen is effective;  continue present plan and medications.  5. GERD without esophagitis The current medical regimen is effective;  continue present plan and medications.  6. Lumbar back pain The current medical regimen is effective;  continue present plan and medications.  7. Chronic pain syndrome Fair Control. The current medical regimen is effective;  continue present plan and medications.  8. Pain in both hands Labs c/w Rheumatoid arthritis.  Refer to rheumatology.  - ANA w/Reflex - CYCLIC CITRUL PEPTIDE ANTIBODY, IGG/IGA - C-reactive protein - Rheumatoid factor - Sedimentation rate - TSH  9. Chronic gout without tophus, unspecified cause, unspecified site Well controlled. - Uric acid  10. Needs flu vacine - Flu Vaccine MDCK QUAD PF  11. Chronic respiratory failure with hypoxia (HCC) Continue O2 at 2 L PNC.  13. Uncomplicated opioid dependence ([place]) The current medical regimen is effective;  continue present plan and medications.  Follow-up: Return in about 3 months (around 06/22/2020) for fasting.  AVS was given to patient prior to departure.  [name] [facility] [phone]

## 2020-03-24 DIAGNOSIS — N183 Chronic kidney disease, stage 3 unspecified: Secondary | ICD-10-CM | POA: Diagnosis not present

## 2020-03-24 DIAGNOSIS — I10 Essential (primary) hypertension: Secondary | ICD-10-CM | POA: Diagnosis not present

## 2020-03-24 DIAGNOSIS — I5032 Chronic diastolic (congestive) heart failure: Secondary | ICD-10-CM | POA: Diagnosis not present

## 2020-03-24 DIAGNOSIS — R309 Painful micturition, unspecified: Secondary | ICD-10-CM | POA: Diagnosis not present

## 2020-03-24 DIAGNOSIS — R809 Proteinuria, unspecified: Secondary | ICD-10-CM | POA: Diagnosis not present

## 2020-03-24 DIAGNOSIS — E1169 Type 2 diabetes mellitus with other specified complication: Secondary | ICD-10-CM | POA: Diagnosis not present

## 2020-03-25 LAB — RHEUMATOID FACTOR: Rheumatoid fact SerPl-aCnc: 173 IU/mL — ABNORMAL HIGH (ref 0.0–13.9)

## 2020-03-25 LAB — CBC WITH DIFFERENTIAL/PLATELET
Basophils Absolute: 0.1 10*3/uL (ref 0.0–0.2)
Basos: 1 %
EOS (ABSOLUTE): 0.2 10*3/uL (ref 0.0–0.4)
Eos: 2 %
Hematocrit: 29.8 % — ABNORMAL LOW (ref 34.0–46.6)
Hemoglobin: 9.7 g/dL — ABNORMAL LOW (ref 11.1–15.9)
Immature Grans (Abs): 0 10*3/uL (ref 0.0–0.1)
Immature Granulocytes: 0 %
Lymphocytes Absolute: 2 10*3/uL (ref 0.7–3.1)
Lymphs: 17 %
MCH: 26.1 pg — ABNORMAL LOW (ref 26.6–33.0)
MCHC: 32.6 g/dL (ref 31.5–35.7)
MCV: 80 fL (ref 79–97)
Monocytes Absolute: 0.7 10*3/uL (ref 0.1–0.9)
Monocytes: 6 %
Neutrophils Absolute: 8.8 10*3/uL — ABNORMAL HIGH (ref 1.4–7.0)
Neutrophils: 74 %
Platelets: 350 10*3/uL (ref 150–450)
RBC: 3.72 x10E6/uL — ABNORMAL LOW (ref 3.77–5.28)
RDW: 15.7 % — ABNORMAL HIGH (ref 11.7–15.4)
WBC: 11.8 10*3/uL — ABNORMAL HIGH (ref 3.4–10.8)

## 2020-03-25 LAB — COMPREHENSIVE METABOLIC PANEL
ALT: 19 IU/L (ref 0–32)
AST: 16 IU/L (ref 0–40)
Albumin/Globulin Ratio: 1 — ABNORMAL LOW (ref 1.2–2.2)
Albumin: 3.6 g/dL — ABNORMAL LOW (ref 3.8–4.9)
Alkaline Phosphatase: 188 IU/L — ABNORMAL HIGH (ref 48–121)
BUN/Creatinine Ratio: 16 (ref 9–23)
BUN: 28 mg/dL — ABNORMAL HIGH (ref 6–24)
Bilirubin Total: 0.4 mg/dL (ref 0.0–1.2)
CO2: 20 mmol/L (ref 20–29)
Calcium: 8.6 mg/dL — ABNORMAL LOW (ref 8.7–10.2)
Chloride: 103 mmol/L (ref 96–106)
Creatinine, Ser: 1.73 mg/dL — ABNORMAL HIGH (ref 0.57–1.00)
GFR calc Af Amer: 37 mL/min/{1.73_m2} — ABNORMAL LOW (ref >59)
GFR calc non Af Amer: 32 mL/min/{1.73_m2} — ABNORMAL LOW (ref >59)
Globulin, Total: 3.6 g/dL (ref 1.5–4.5)
Glucose: 132 mg/dL — ABNORMAL HIGH (ref 65–99)
Potassium: 4.5 mmol/L (ref 3.5–5.2)
Sodium: 139 mmol/L (ref 134–144)
Total Protein: 7.2 g/dL (ref 6.0–8.5)

## 2020-03-25 LAB — CARDIOVASCULAR RISK ASSESSMENT

## 2020-03-25 LAB — TSH: TSH: 3.59 u[IU]/mL (ref 0.450–4.500)

## 2020-03-25 LAB — LIPID PANEL
Chol/HDL Ratio: 3 ratio (ref 0.0–4.4)
Cholesterol, Total: 147 mg/dL (ref 100–199)
HDL: 49 mg/dL (ref >39)
LDL Chol Calc (NIH): 76 mg/dL (ref 0–99)
Triglycerides: 124 mg/dL (ref 0–149)
VLDL Cholesterol Cal: 22 mg/dL (ref 5–40)

## 2020-03-25 LAB — C-REACTIVE PROTEIN: CRP: 25 mg/L — ABNORMAL HIGH (ref 0–10)

## 2020-03-25 LAB — SEDIMENTATION RATE: Sed Rate: 94 mm/hr — ABNORMAL HIGH (ref 0–40)

## 2020-03-25 LAB — CYCLIC CITRUL PEPTIDE ANTIBODY, IGG/IGA: Cyclic Citrullin Peptide Ab: 6 units (ref 0–19)

## 2020-03-25 LAB — URIC ACID: Uric Acid: 5 mg/dL (ref 3.0–7.2)

## 2020-03-25 LAB — ANA W/REFLEX: Anti Nuclear Antibody (ANA): NEGATIVE

## 2020-03-25 LAB — HEMOGLOBIN A1C
Est. average glucose Bld gHb Est-mCnc: 146 mg/dL
Hgb A1c MFr Bld: 6.7 % — ABNORMAL HIGH (ref 4.8–5.6)

## 2020-03-27 ENCOUNTER — Encounter: Payer: Self-pay | Admitting: Family Medicine

## 2020-03-28 ENCOUNTER — Telehealth: Payer: Self-pay

## 2020-03-28 ENCOUNTER — Other Ambulatory Visit: Payer: Self-pay

## 2020-03-28 DIAGNOSIS — N289 Disorder of kidney and ureter, unspecified: Secondary | ICD-10-CM

## 2020-03-28 NOTE — Telephone Encounter (Signed)
Patient notified to decrease torsemide to 40 mg twice daily instead of three times daily.

## 2020-04-04 NOTE — Progress Notes (Signed)
Heart Failure TeleHealth Note  Due to national recommendations of social distancing due to [place] 19, Audio/video telehealth visit is felt to be most appropriate for this patient at this time.  See MyChart message from today for patient consent regarding telehealth for [facility].  Date:  04/05/2020   ID:  Dana [name], DOB [birth year], MRN [id number]  Location: Home  Provider location: [facility] Type of Visit: Established patient   PCP:  [name], MD  Cardiologist:  No primary care provider on file. Primary HF:  Dr [name]   Chief Complaint: Heart Failure   History of Present Illness: Dana [name] is a 58 y.o. female with a history of obesity, diastolic heart failure, DMI, neuropathy, HTN, hyperlipidemia, CAD (most recent [facility] 08/2016 DES RCA and stent LAD total of 4 stents), anemia, OSA -->started CPAP 12/2016, PAF S/P ablation, PAD S/P L great toe and 4th toe amputation.   Admitted 4/24 - 11/11/17 with chest pain while getting up out of her recliner. EKG unremarkable. CXR with pulmonary edema. Troponin 0.03 - 0.05. Diuresed with IV lasix with resolution of symptoms. Echo with normal EF as below. Torsemide cut back to 60 mg daily on discharge.   Echo today 12/08/19 EF 60-65% RV ok. Grade II DD Personally reviewed  Zio patch 02/2018 (for possible syncope) with no arrhythmias, only occasional PACs/PVCs  She is s/p Cardiomems placement  1/95/09  PA Systolic PA Mean PA Diastolic Heart Rate  Sensor:    24 mmHg 11 mmHg 4 mmHg 78 bpm  She presents via audioconferencing for a telehealth visit today. Last visit Dr [name] started farxiga. Since that time she has felt better and has stopped torsemide and metolazone. Says she feels better than she has in a long time. No longer having palpitations. Limited by joint pain. No chest pain. Denies SOB/PND/Orthopnea. No chest pain.  She has been able use compression stockings daily. Using oxygen at night.  Weight at home 252-253 pounds.   she denies symptoms worrisome for COVID 19.   Past Medical History:  Diagnosis Date  . Acquired absence of left great toe ([place])   . Acquired absence of other left toe(s) ([place])   . Anginal pain ([place])   . Atherosclerotic heart disease of native coronary artery without angina pectoris   . CAD (coronary artery disease)    08/2016 DES to RCA, also with history of LAD stenting.   . Chronic diastolic (congestive) heart failure ([place])   . Chronic kidney disease, stage 2 (mild)   . Chronic systolic (congestive) heart failure ([place])   . Diabetes ([place])   . Diastolic CHF ([place])   . Gastro-esophageal reflux disease without esophagitis   . Hyperlipidemia   . Hypertension   . Hypertensive heart disease with heart failure ([place])   . Hypoxemia   . Idiopathic gout, multiple sites   . Localized edema   . Low back pain   . Mixed hyperlipidemia   . Mixed incontinence   . Morbid (severe) obesity due to excess calories ([place])   . OSA (obstructive sleep apnea)   . Other specified hypothyroidism   . Primary insomnia   . Type 2 diabetes mellitus with diabetic nephropathy [facility])    Past Surgical History:  Procedure Laterality Date  . ABDOMINAL HYSTERECTOMY     Partial- Still has both ovaries  . CARDIOVASCULAR STRESS TEST  08/13/2014   Nuclear; Normal  . CARPAL TUNNEL RELEASE    . CATARACT EXTRACTION,  BILATERAL    . CESAREAN SECTION    . CHOLECYSTECTOMY    . CORONARY ANGIOPLASTY WITH STENT PLACEMENT     3 blockages/ 1 stent  . EYE SURGERY    . RIGHT/LEFT HEART CATH AND CORONARY ANGIOGRAPHY N/A 01/07/2017   Procedure: Right/Left Heart Cath and Coronary Angiography;  Surgeon: Larey Dresser, MD;  Location: [place] [facility];  Service: Cardiovascular;  Laterality: N/A;  . ROTATOR CUFF REPAIR    . TOE AMPUTATION Left   . US ECHOCARDIOGRAPHY  05/2017   Normal     Current Outpatient Medications  Medication Sig Dispense Refill  . allopurinol (ZYLOPRIM) 100 MG tablet  Take 2 tablets (200 mg total) by mouth daily. 180 tablet 3  . aspirin 81 MG chewable tablet Chew 81 mg by mouth daily.    . carvedilol (COREG) 6.25 MG tablet Take 1.5 tablets (9.375 mg total) by mouth 2 (two) times daily. 90 tablet 3  . clopidogrel (PLAVIX) 75 MG tablet Take 1 tablet (75 mg total) by mouth at bedtime. 90 tablet 3  . dapagliflozin propanediol (FARXIGA) 10 MG TABS tablet Take 1 tablet (10 mg total) by mouth daily before breakfast. 30 tablet 11  . EPINEPHrine 0.3 mg/0.3 mL IJ SOAJ injection Use as directed for life-threatening allergic reaction. 4 Device 3  . Evolocumab (REPATHA SURECLICK) 098 MG/ML SOAJ Inject 1 Dose into the skin every 14 (fourteen) days. 2 pen 11  . ezetimibe (ZETIA) 10 MG tablet Take 1 tablet (10 mg total) by mouth daily. 90 tablet 3  . famotidine (PEPCID) 20 MG tablet Take 1 tablet (20 mg total) by mouth daily. 30 tablet 5  . ferrous sulfate 325 (65 FE) MG tablet Take 325 mg by mouth 2 (two) times daily with a meal.    . hydrALAZINE (APRESOLINE) 100 MG tablet Take 1 tablet (100 mg total) by mouth 3 (three) times daily. 270 tablet 3  . HYDROcodone-acetaminophen (NORCO) 7.5-325 MG tablet Take 1 tablet by mouth 3 (three) times daily as needed for moderate pain. 90 tablet 0  . insulin NPH Human (HUMULIN N,NOVOLIN N) 100 UNIT/ML injection Inject 20 Units into the skin 2 (two) times daily before a meal.     . insulin regular (NOVOLIN R,HUMULIN R) 100 units/mL injection Inject 6-12 Units into the skin 3 (three) times daily before meals. Per sliding scale    . isosorbide mononitrate (IMDUR) 30 MG 24 hr tablet Take 1 tablet (30 mg total) by mouth daily. 90 tablet 3  . latanoprost (XALATAN) 0.005 % ophthalmic solution Place 1 drop into both eyes at bedtime.    Marland Kitchen LORazepam (ATIVAN) 0.5 MG tablet Take 1 tablet (0.5 mg total) by mouth daily as needed for anxiety. 30 tablet 2  . magnesium oxide (MAG-OX) 400 MG tablet Take 2 tablets (800 mg total) by mouth daily. Take 2 (400 mg)  daily=800 mg 60 tablet 1  . metolazone (ZAROXOLYN) 2.5 MG tablet Take 1 tablet (2.5 mg total) by mouth once a week. On Wednesdays 13 tablet 3  . nitroGLYCERIN (NITROSTAT) 0.4 MG SL tablet Place 1 tablet (0.4 mg total) under the tongue every 5 (five) minutes as needed for chest pain. 15 tablet 2  . Omega-3 Fatty Acids (FISH OIL PO) Take 1 capsule by mouth daily.    . OXYGEN Inhale 2 L into the lungs at bedtime.    . Probiotic CAPS Take 1 capsule by mouth daily.    . promethazine (PHENERGAN) 25 MG tablet Take 1 tablet (  25 mg total) by mouth every 6 (six) hours as needed for nausea or vomiting. 90 tablet 1  . spironolactone (ALDACTONE) 25 MG tablet Take 1 tablet (25 mg total) by mouth daily. 30 tablet 3  . torsemide (DEMADEX) 20 MG tablet Take 40 mg three times a day 180 tablet 3   No current facility-administered medications for this encounter.    Allergies:   Naproxen, Celecoxib, Aspirin, Glucosamine, Metoclopramide, Metolazone, Shellfish-derived products, and Tramadol   Social History:  The patient  reports that she has quit smoking. She has never used smokeless tobacco. She reports previous alcohol use. She reports previous drug use.   Family History:  The patient's family history includes Bipolar disorder in an other family member; Breast cancer in her mother; Coronary artery disease in her mother; Depression in an other family member; Diabetes type II in her mother; Hyperlipidemia in her mother; Hypertension in her father and mother; Lung cancer in her mother; Schizophrenia in an other family member.   ROS:  Please see the history of present illness.   All other systems are personally reviewed and negative.   Exam:  Tele Health Call; Exam is subjective General:  Speaks in full sentences. No resp difficulty. Lungs: Normal respiratory effort with conversation.  Abdomen: Non-distended per patient report Extremities: Pt reports edema better with compression stockings.  Neuro: Alert &  oriented x 3.   Recent Labs: 05/05/2019: B Natriuretic Peptide 47.7 12/21/2019: Magnesium 1.9 03/23/2020: ALT 19; BUN 28; Creatinine, Ser 1.73; Hemoglobin 9.7; Platelets 350; Potassium 4.5; Sodium 139; TSH 3.590  Personally reviewed   Wt Readings from Last 3 Encounters:  03/23/20 116.1 kg (256 lb)  03/18/20 117.5 kg (259 lb)  12/24/19 117.9 kg (260 lb)      ASSESSMENT AND PLAN:  Chronic Diastolic Heart Failure - Echo 5/19 LVEF 65-70%, Grade 2 DD - s/p Cardiomems device 03/11/18. Cardiomems reading 38mmHG today, Goal 10.  - Chronic NYHA IIIVolume status stable.  - Echo today 12/08/19 EF 60-65% RV normal grade II DD Personally reviewed - NYHA II.- Cardiomems reading 4 today. Goal 10.  - Volume status stable with cardiomems. Continue torsemide and metolazone as needed.  - Continue coreg to 9.375 mg twice a day.  - Continue hydralazine 100 mg three times a day -Continue imdur30mg  daily.  - Continue spiro 25 mg daily.  -Continue compression stockings.  2. Obesity Body mass index is 41.93 kg/m. - Discussed portion control 3. OSA  - Continue CPAP 4. CAD - 4 stents. 08/2016 stent RCA.  - 2018 LHC nonobstructive. - No chest pain - Continue plavix, bb, and statin.  - She has seen Dr. Debara Pickett in [facility] 3/21. Now on [place] BMET next week at PCP. Asked her to fax results to the office.   COVID screen The patient does not have any symptoms that suggest any further testing/ screening at this time.  Social distancing reinforced today.  Patient Risk: After full review of this patients clinical status, I feel that they are at moderate risk for cardiac decompensation at this time.  Relevant cardiac medications were reviewed at length with the patient today. The patient does not have concerns regarding their medications at this time.   The following changes were made today:  Change torsemide and metolazone to as needed.  Recommended follow-up:  3  months with virtual visit.   Today, I have spent 15 minutes with the patient with telehealth technology discussing the above issues .  Jeanmarie Hubert, NP  04/05/2020 9:50 AM  [place] [address] [facility] [place] [address] [phone] (office) [phone] (fax)

## 2020-04-05 ENCOUNTER — Other Ambulatory Visit: Payer: Self-pay

## 2020-04-05 ENCOUNTER — Encounter (HOSPITAL_COMMUNITY): Payer: Self-pay

## 2020-04-05 ENCOUNTER — Ambulatory Visit (HOSPITAL_COMMUNITY)
Admission: RE | Admit: 2020-04-05 | Discharge: 2020-04-05 | Disposition: A | Discharge status: Home / Self Care | Payer: Medicare Other | Source: Ambulatory Visit | Attending: Ophthalmology | Admitting: Ophthalmology

## 2020-04-05 VITALS — BP 132/68 | Wt 252.0 lb

## 2020-04-05 DIAGNOSIS — G4733 Obstructive sleep apnea (adult) (pediatric): Secondary | ICD-10-CM

## 2020-04-05 DIAGNOSIS — I5032 Chronic diastolic (congestive) heart failure: Secondary | ICD-10-CM

## 2020-04-05 DIAGNOSIS — I2510 Atherosclerotic heart disease of native coronary artery without angina pectoris: Secondary | ICD-10-CM

## 2020-04-05 MED ORDER — METOLAZONE 2.5 MG PO TABS: 2.5000 mg | ORAL_TABLET | ORAL | 3 refills | Status: DC | PRN

## 2020-04-05 MED ORDER — TORSEMIDE 20 MG PO TABS: 20.0000 mg | ORAL_TABLET | ORAL | 3 refills | Status: DC | PRN

## 2020-04-05 NOTE — Patient Instructions (Signed)
It was great to see you today!  Take Metolazone and Torsemide ONLY AS NEEDED  Please call our office in January to schedule your follow up virtual appointment  If you have any questions or concerns before your next appointment please send [name] a message through [place] or call our office at [phone].    TO LEAVE A MESSAGE FOR THE NURSE SELECT OPTION 2, PLEASE LEAVE A MESSAGE INCLUDING: . YOUR NAME . DATE OF BIRTH . CALL BACK NUMBER . REASON FOR CALL**this is important as we prioritize the call backs  [place] AS LONG AS YOU CALL BEFORE 4:00 PM  At the [facility], you and your health needs are our priority. As part of our continuing mission to provide you with exceptional heart care, we have created designated Provider Care Teams. These Care Teams include your primary Cardiologist (physician) and Advanced Practice Providers (APPs- Physician Assistants and Nurse Practitioners) who all work together to provide you with the care you need, when you need it.   You may see any of the following providers on your designated Care Team at your next follow up: Marland Kitchen Dr [name] . Dr Loralie Champagne . Darrick Grinder, NP . Lyda Jester, PA . Audry Riles, PharmD   Please be sure to bring in all your medications bottles to every appointment.

## 2020-04-05 NOTE — Addendum Note (Signed)
Encounter addended by: Malena Edman, RN on: 04/05/2020 10:26 AM  Actions taken: Pharmacy for encounter modified, Order list changed, Clinical Note Signed

## 2020-04-11 ENCOUNTER — Ambulatory Visit: Payer: Medicare Other

## 2020-04-11 ENCOUNTER — Other Ambulatory Visit: Payer: Self-pay

## 2020-04-11 DIAGNOSIS — R799 Abnormal finding of blood chemistry, unspecified: Secondary | ICD-10-CM

## 2020-04-11 LAB — COMPREHENSIVE METABOLIC PANEL
ALT: 15 IU/L (ref 0–32)
AST: 15 IU/L (ref 0–40)
Albumin/Globulin Ratio: 1.1 — ABNORMAL LOW (ref 1.2–2.2)
Albumin: 3.6 g/dL — ABNORMAL LOW (ref 3.8–4.9)
Alkaline Phosphatase: 174 IU/L — ABNORMAL HIGH (ref 44–121)
BUN/Creatinine Ratio: 16 (ref 9–23)
BUN: 24 mg/dL (ref 6–24)
Bilirubin Total: 0.3 mg/dL (ref 0.0–1.2)
CO2: 20 mmol/L (ref 20–29)
Calcium: 9 mg/dL (ref 8.7–10.2)
Chloride: 106 mmol/L (ref 96–106)
Creatinine, Ser: 1.54 mg/dL — ABNORMAL HIGH (ref 0.57–1.00)
GFR calc Af Amer: 43 mL/min/{1.73_m2} — ABNORMAL LOW (ref >59)
GFR calc non Af Amer: 37 mL/min/{1.73_m2} — ABNORMAL LOW (ref >59)
Globulin, Total: 3.4 g/dL (ref 1.5–4.5)
Glucose: 121 mg/dL — ABNORMAL HIGH (ref 65–99)
Potassium: 4.7 mmol/L (ref 3.5–5.2)
Sodium: 140 mmol/L (ref 134–144)
Total Protein: 7 g/dL (ref 6.0–8.5)

## 2020-04-14 ENCOUNTER — Other Ambulatory Visit: Payer: Self-pay

## 2020-04-14 MED ORDER — HYDROCODONE-ACETAMINOPHEN 7.5-325 MG PO TABS: 1.0000 | ORAL_TABLET | Freq: Three times a day (TID) | ORAL | 0 refills | Status: DC | PRN

## 2020-04-29 ENCOUNTER — Other Ambulatory Visit (HOSPITAL_COMMUNITY): Payer: Self-pay | Admitting: Adult Health

## 2020-05-03 DIAGNOSIS — M79671 Pain in right foot: Secondary | ICD-10-CM | POA: Diagnosis not present

## 2020-05-03 DIAGNOSIS — M79641 Pain in right hand: Secondary | ICD-10-CM | POA: Diagnosis not present

## 2020-05-03 DIAGNOSIS — R5382 Chronic fatigue, unspecified: Secondary | ICD-10-CM | POA: Diagnosis not present

## 2020-05-03 DIAGNOSIS — M2550 Pain in unspecified joint: Secondary | ICD-10-CM | POA: Diagnosis not present

## 2020-05-03 DIAGNOSIS — M79642 Pain in left hand: Secondary | ICD-10-CM | POA: Diagnosis not present

## 2020-05-03 DIAGNOSIS — M79672 Pain in left foot: Secondary | ICD-10-CM | POA: Diagnosis not present

## 2020-05-13 ENCOUNTER — Other Ambulatory Visit: Payer: Self-pay

## 2020-05-13 MED ORDER — HYDROCODONE-ACETAMINOPHEN 7.5-325 MG PO TABS: 1.0000 | ORAL_TABLET | Freq: Three times a day (TID) | ORAL | 0 refills | Status: DC | PRN

## 2020-05-19 DIAGNOSIS — G4733 Obstructive sleep apnea (adult) (pediatric): Secondary | ICD-10-CM | POA: Diagnosis not present

## 2020-05-19 DIAGNOSIS — J301 Allergic rhinitis due to pollen: Secondary | ICD-10-CM | POA: Diagnosis not present

## 2020-05-19 DIAGNOSIS — J4520 Mild intermittent asthma, uncomplicated: Secondary | ICD-10-CM | POA: Diagnosis not present

## 2020-05-23 ENCOUNTER — Other Ambulatory Visit: Payer: Self-pay

## 2020-05-23 MED ORDER — LORAZEPAM 0.5 MG PO TABS: 0.5000 mg | ORAL_TABLET | Freq: Every day | ORAL | 2 refills | Status: DC | PRN

## 2020-05-24 NOTE — Telephone Encounter (Signed)
Pt is aware of Rx that was sent.

## 2020-05-25 DIAGNOSIS — E113593 Type 2 diabetes mellitus with proliferative diabetic retinopathy without macular edema, bilateral: Secondary | ICD-10-CM | POA: Diagnosis not present

## 2020-05-25 DIAGNOSIS — H524 Presbyopia: Secondary | ICD-10-CM | POA: Diagnosis not present

## 2020-05-31 ENCOUNTER — Ambulatory Visit: Payer: Medicare Other

## 2020-05-31 ENCOUNTER — Other Ambulatory Visit: Payer: Self-pay

## 2020-05-31 DIAGNOSIS — I10 Essential (primary) hypertension: Secondary | ICD-10-CM

## 2020-05-31 DIAGNOSIS — E782 Mixed hyperlipidemia: Secondary | ICD-10-CM

## 2020-05-31 DIAGNOSIS — E1142 Type 2 diabetes mellitus with diabetic polyneuropathy: Secondary | ICD-10-CM

## 2020-05-31 NOTE — Chronic Care Management (AMB) (Signed)
Chronic Care Management Pharmacy  Name: Dana [name]  MRN: [id number] DOB: [birth year]  Chief Complaint/ HPI  Dana [name],  59 y.o. , female presents for their Follow-Up CCM visit with the clinical pharmacist via telephone due to COVID-19 Pandemic.  PCP : [name], MD  Their chronic conditions include: afib, CAD, HTN, diastolic heart failure, COPD, GERD, DM2, anemia, HLD, chronic pain syndrome, depression, gout, insomnia.   Office Visits: 03/23/2020 - refer to rheumatology. Flu shot administered in office. Decrease torsemide 40 mg bid.  03/18/2020 - Bruised sole of foot - cipro for possible infection under bruise.  Consult Visit: 04/05/2020 - Cardiology - metolazone prn and torsemide 20 mg oral as needed.  03/24/2020 -  Monitor bp. Caution long-term diuretic.  Medications: Outpatient Encounter Medications as of 05/31/2020  Medication Sig Note  . allopurinol (ZYLOPRIM) 100 MG tablet Take 2 tablets (200 mg total) by mouth daily.   Marland Kitchen aspirin 81 MG chewable tablet Chew 81 mg by mouth daily.   . carvedilol (COREG) 6.25 MG tablet Take 1.5 tablets (9.375 mg total) by mouth 2 (two) times daily.   . clopidogrel (PLAVIX) 75 MG tablet Take 1 tablet (75 mg total) by mouth at bedtime.   . dapagliflozin propanediol (FARXIGA) 10 MG TABS tablet Take 1 tablet (10 mg total) by mouth daily before breakfast.   . EPINEPHrine 0.3 mg/0.3 mL IJ SOAJ injection Use as directed for life-threatening allergic reaction.   . Evolocumab (REPATHA SURECLICK) 366 MG/ML SOAJ Inject 1 Dose into the skin every 14 (fourteen) days.   Marland Kitchen ezetimibe (ZETIA) 10 MG tablet Take 1 tablet (10 mg total) by mouth daily.   . famotidine (PEPCID) 20 MG tablet Take 1 tablet (20 mg total) by mouth daily.   . ferrous sulfate 325 (65 FE) MG tablet Take 325 mg by mouth 2 (two) times daily with a meal.   . hydrALAZINE (APRESOLINE) 100 MG tablet Take 1 tablet (100 mg total) by mouth 3 (three) times daily.   Marland Kitchen  HYDROcodone-acetaminophen (NORCO) 7.5-325 MG tablet Take 1 tablet by mouth 3 (three) times daily as needed for moderate pain.   Marland Kitchen insulin NPH Human (HUMULIN N,NOVOLIN N) 100 UNIT/ML injection Inject 20 Units into the skin 2 (two) times daily before a meal.  03/04/2018: Novolin N  . insulin regular (NOVOLIN R,HUMULIN R) 100 units/mL injection Inject 6-12 Units into the skin 3 (three) times daily before meals. Per sliding scale 03/04/2018: Novolin R  . isosorbide mononitrate (IMDUR) 30 MG 24 hr tablet Take 1 tablet (30 mg total) by mouth daily.   Marland Kitchen latanoprost (XALATAN) 0.005 % ophthalmic solution Place 1 drop into both eyes at bedtime.   Marland Kitchen LORazepam (ATIVAN) 0.5 MG tablet Take 1 tablet (0.5 mg total) by mouth daily as needed for anxiety.   . magnesium oxide (MAG-OX) 400 MG tablet Take 2 tablets (800 mg total) by mouth daily. Take 2 (400 mg) daily=800 mg   . metolazone (ZAROXOLYN) 2.5 MG tablet Take 1 tablet (2.5 mg total) by mouth as needed.   . nitroGLYCERIN (NITROSTAT) 0.4 MG SL tablet Place 1 tablet (0.4 mg total) under the tongue every 5 (five) minutes as needed for chest pain.   . Omega-3 Fatty Acids (FISH OIL PO) Take 1 capsule by mouth daily.   . OXYGEN Inhale 2 L into the lungs at bedtime.   . Probiotic CAPS Take 1 capsule by mouth daily.   . promethazine (PHENERGAN) 25 MG tablet Take 1 tablet (25 mg  total) by mouth every 6 (six) hours as needed for nausea or vomiting.   Marland Kitchen spironolactone (ALDACTONE) 25 MG tablet TAKE 1 TABLET BY MOUTH  DAILY   . torsemide (DEMADEX) 20 MG tablet Take 1 tablet (20 mg total) by mouth as needed.    No facility-administered encounter medications on file as of 05/31/2020.   Allergies  Allergen Reactions  . Naproxen Hives and Rash  . Celecoxib Other (See Comments)    Stomach pain  . Aspirin Other (See Comments)    CAN NOT TAKE DUE TO ULCERS  . Glucosamine Hives, Swelling and Other (See Comments)    Angioedema   . Metoclopramide Other (See Comments)     Facial twitching and stuttering  . Metolazone Other (See Comments)    Acute renal failure  . Shellfish-Derived Products Hives, Swelling and Other (See Comments)    Angioedema   . Tramadol Nausea And Vomiting        SDOH Screenings   Alcohol Screen:   . Last Alcohol Screening Score (AUDIT): Not on file  Depression (PHQ2-9): Low Risk   . PHQ-2 Score: 0  Financial Resource Strain:   . Difficulty of Paying Living Expenses: Not on file  Food Insecurity: No Food Insecurity  . Worried About Charity fundraiser in the Last Year: Never true  . Ran Out of Food in the Last Year: Never true  Housing: Low Risk   . Last Housing Risk Score: 0  Physical Activity: Insufficiently Active  . Days of Exercise per Week: 3 days  . Minutes of Exercise per Session: 40 min  Social Connections:   . Frequency of Communication with Friends and Family: Not on file  . Frequency of Social Gatherings with Friends and Family: Not on file  . Attends Religious Services: Not on file  . Active Member of Clubs or Organizations: Not on file  . Attends Archivist Meetings: Not on file  . Marital Status: Not on file  Stress:   . Feeling of Stress : Not on file  Tobacco Use: Medium Risk  . Smoking Tobacco Use: Former Smoker  . Smokeless Tobacco Use: Never Used  Transportation Needs:   . Film/video editor (Medical): Not on file  . Lack of Transportation (Non-Medical): Not on file    Current Diagnosis/Assessment:  Goals Addressed            This Visit's Progress   . Pharmacy Care Plan       CARE PLAN ENTRY (see longitudinal plan of care for additional care plan information)  Current Barriers:  . Chronic Disease Management support, education, and care coordination needs related to Hypertension, Hyperlipidemia, and Diabetes   Hypertension BP Readings from Last 3 Encounters:  04/05/20 132/68  03/23/20 140/60  03/18/20 (!) 160/60   . Pharmacist Clinical Goal(s): o Over the next 90  days, patient will work with PharmD and providers to maintain BP goal <130/80 . Current regimen:  o Hydralazine 100 mg tid  . Interventions: o Continue healthy diet each day.  o Keep up the good work riding exercise bike with goal of 150 minutes each week.  . Patient self care activities - Over the next 90 days, patient will: o Check BP daily, document, and provide at future appointments o Ensure daily salt intake < 2300 mg/day  Hyperlipidemia Lab Results  Component Value Date/Time   LDLCALC 76 03/23/2020 11:01 AM   . Pharmacist Clinical Goal(s): o Over the next 90 days, patient will work  with PharmD and providers to achieve LDL goal < 70 . Current regimen:  o Repatha 140 mg/ml q14 days o Ezetimibe 10 mg daily o Omega 3 fatty acids daily . Interventions: o Discussed LDL increased since statin discontinued. Patient will see Dr. Debara Pickett in January and wait for his recommendation. . Patient self care activities - Over the next 90 days, patient will: o Continue to take medications as prescribed.  o Contact pharmacist or provider with any questions or conerns.   Diabetes Lab Results  Component Value Date/Time   HGBA1C 6.7 (H) 03/23/2020 11:01 AM   . Pharmacist Clinical Goal(s): o Over the next 90 days, patient will work with PharmD and providers to maintain A1c goal <7% . Current regimen:  o Farxiga 10 mg daily before breakfast o Humulin N 20 units twice daily before a meal o Humulin R 6-12 units three times daily before meals.  . Interventions: o Encouraged patient to keep up the good work with riding exercise bike.  o Discussed regular meal schedule to avoid low blood sugar.  o Discussed appropriate ways to correct low blood sugar.  o Discussed keeping up the good work with healthy diet and consistent meal times.  . Patient self care activities - Over the next 90 days, patient will: o Check blood sugar 3-4 times daily, document, and provide at future appointments o Contact  provider with any episodes of hypoglycemia  Medication management . Pharmacist Clinical Goal(s): o Over the next 90 days, patient will work with PharmD and providers to maintain optimal medication adherence . Current pharmacy: [facility] . Interventions o Comprehensive medication review performed. o Continue current medication management strategy . Patient self care activities - Over the next 90 days, patient will: o Focus on medication adherence by continuing to use pill box.  o Take medications as prescribed o Report any questions or concerns to PharmD and/or provider(s)  Please see past updates related to this goal by clicking on the "Past Updates" button in the selected goal          Diabetes   Recent Relevant Labs: Lab Results  Component Value Date/Time   HGBA1C 6.7 (H) 03/23/2020 11:01 AM    Last A1C per patient 6.1%  Checking BG: Daily  Recent FBG Readings: 80-94 normally after later night birthday celebration it was 103 mg/dL  Recent HS BG readings: 150-160 mg/dL Patient has failed these meds in past: none reported Patient is currently controlled on the following medications:   Farxiga 10 mg daily before breakfast  Humulin N 20 units twice daily before a meal  Humulin R 6-12 units into the skin tid before meals per sliding scale  Last diabetic Foot exam: 11/2019  Last diabetic Eye exam:   Lab Results  Component Value Date/Time   HMDIABEYEEXA Retinopathy (A) 07/22/2018 12:00 AM     We discussed: diet and exercise extensively Patient reports rarely experiencing low blood sugar. Works to keep timing of meals and medication appropriate to avoid. Patient states she is feeling really well lately.    Plan  Continue current medications,   Hyperlipidemia/CAD - Managed by Dr. Debara Pickett   LDL goal < 70  Lipid Panel     Component Value Date/Time   CHOL 147 03/23/2020 1101   TRIG 124 03/23/2020 1101   HDL 49 03/23/2020 1101   LDLCALC 76 03/23/2020  1101    Hepatic Function Latest Ref Rng & Units 04/11/2020 03/23/2020 12/21/2019  Total Protein 6.0 - 8.5 g/dL 7.0  7.2 6.9  Albumin 3.8 - 4.9 g/dL 3.6(L) 3.6(L) 3.5(L)  AST 0 - 40 IU/L _0 ALT 0 - 32 IU/L _1 Alk Phosphatase 44 - 121 IU/L 174(H) 188(H) 168(H)  Total Bilirubin 0.0 - 1.2 mg/dL 0.3 0.4 0.2  Bilirubin, Direct 0.1 - 0.5 mg/dL - - -     The ASCVD Risk score Mikey Bussing DC Jr., et al., 2013) failed to calculate for the following reasons:   The patient has a prior MI or stroke diagnosis   Patient has failed these meds in past: pravastatin Patient is currently uncontrolled on the following medications:  . Repatha 140 mg/ml q14 days . Ezetimibe 10 mg daily . Clopidogrel 75 mg qhs . Omega 3 fatty acids daily . Aspirin 81 mg daily  We discussed:  diet and exercise extensively. Patient is scheduled to see Dr. Debara Pickett in January and he will assess need to resume statin at that time.   Plan  Continue current medications and follow Dr. Lysbeth Penner decision on resuming statin.    Heart Failure   Type: Diastolic  Last ejection fraction: 60-65% NYHA Class: III (marked limitation of activity)  Patient has failed these meds in past: none reported.  Patient is currently controlled on the following medications:   Carvedilol 9.375 mg bid  Isosorbide MN 30 mg daily  Metolazone 2.5 mg prn  Spirnolactone 25 mg daily  Torsemide 20 mg prn  We discussed diet and exercise extensively. Checks blood pressure and weight daily. Has a heart sensor for CHF and transmits readings to heart failure clinic. The heart failure clinic calls with diuretic adjustments based on readings. Patient states things are going really well with her heart failure currently.   Plan  Continue current medications  and  Hypertension   BP today is:  <130/80  Office blood pressures are  BP Readings from Last 3 Encounters:  04/05/20 132/68  03/23/20 140/60  03/18/20 (!) 160/60    Patient has failed  these meds in the past: none reported Patient is currently controlled on the following medications:   Hydralazine 100 mg tid  Patient checks BP at home daily  Patient home BP readings are ranging: 116-130/70  We discussed diet and exercise extensively  Plan  Continue current medications    Chronic Pain Syndrome   Patient has failed these meds in past: none reported Patient is currently controlled on the following medications:  . Hydrocodone-acetaminophen 7.5-325 mg tid prn moderate pain  We discussed:  Patient reports feeling really well lately. Excited for holidays and thankful to have her mom to celebrate them with. Patient uses pain medication prn.   Plan  Continue current medications    Depression/Anxiety   Patient has failed these meds in past: none reported Patient is currently controlled on the following medications:  . Lorazepam 0.5 mg daily prn anxiety  We discussed: Patient reports that she is excited for holidays. Thankful to celebrate with her mom who has been battling cancer for a year. 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   Plan  Continue current medications    Vaccines   Reviewed and discussed patient's vaccination history. Has had both Moderna shots 03/10 and 04/07 at [facility]. Patient has not had a Shingrix vaccine due to cost ($175). Encouraged her to check with [facility] with updated cost on insurance. It has been a few years since she checked on coverage.   Immunization History  Administered Date(s) Administered  .  Influenza Inj Mdck Quad Pf 03/23/2020  . Influenza, Quadrivalent, Recombinant, Inj, Pf 05/21/2018  . Influenza,inj,Quad PF,6+ Mos 03/28/2015  . Influenza-Unspecified 04/15/2014, 05/16/2016, 03/12/2019  . Pneumococcal Conjugate-13 05/16/2014    Plan  Recommended patient receive third COVID shot.   Medication Management   [facility] medications Uses pill box?  Yes Pt endorses excellent compliance  We discussed: Patient has good medication compliance. She is knowledgeable of her medications and how she takes them. She follows up with PCP and specialists regularly.   Patient currently has [employer] and [employer] assistance for medication. She states her specialist take care of renewing these sources of funding for her. Patient aware to let PCP or pharmacist know if any issues with renewal.   Plan  Continue current medication management strategy    Follow up: 6 month phone visit

## 2020-05-31 NOTE — Patient Instructions (Signed)
Visit Information  Goals Addressed            This Visit's Progress   . Pharmacy Care Plan       CARE PLAN ENTRY (see longitudinal plan of care for additional care plan information)  Current Barriers:  . Chronic Disease Management support, education, and care coordination needs related to Hypertension, Hyperlipidemia, and Diabetes   Hypertension BP Readings from Last 3 Encounters:  04/05/20 132/68  03/23/20 140/60  03/18/20 (!) 160/60   . Pharmacist Clinical Goal(s): o Over the next 90 days, patient will work with PharmD and providers to maintain BP goal <130/80 . Current regimen:  o Hydralazine 100 mg tid  . Interventions: o Continue healthy diet each day.  o Keep up the good work riding exercise bike with goal of 150 minutes each week.  . Patient self care activities - Over the next 90 days, patient will: o Check BP daily, document, and provide at future appointments o Ensure daily salt intake < 2300 mg/day  Hyperlipidemia Lab Results  Component Value Date/Time   LDLCALC 76 03/23/2020 11:01 AM   . Pharmacist Clinical Goal(s): o Over the next 90 days, patient will work with PharmD and providers to achieve LDL goal < 70 . Current regimen:  o Repatha 140 mg/ml q14 days o Ezetimibe 10 mg daily o Omega 3 fatty acids daily . Interventions: o Discussed LDL increased since statin discontinued. Patient will see Dr. Debara Pickett in January and wait for his recommendation. . Patient self care activities - Over the next 90 days, patient will: o Continue to take medications as prescribed.  o Contact pharmacist or provider with any questions or conerns.   Diabetes Lab Results  Component Value Date/Time   HGBA1C 6.7 (H) 03/23/2020 11:01 AM   . Pharmacist Clinical Goal(s): o Over the next 90 days, patient will work with PharmD and providers to maintain A1c goal <7% . Current regimen:  o Farxiga 10 mg daily before breakfast o Humulin N 20 units twice daily before a meal o Humulin  R 6-12 units three times daily before meals.  . Interventions: o Encouraged patient to keep up the good work with riding exercise bike.  o Discussed regular meal schedule to avoid low blood sugar.  o Discussed appropriate ways to correct low blood sugar.  o Discussed keeping up the good work with healthy diet and consistent meal times.  . Patient self care activities - Over the next 90 days, patient will: o Check blood sugar 3-4 times daily, document, and provide at future appointments o Contact provider with any episodes of hypoglycemia  Medication management . Pharmacist Clinical Goal(s): o Over the next 90 days, patient will work with PharmD and providers to maintain optimal medication adherence . Current pharmacy: [facility] . Interventions o Comprehensive medication review performed. o Continue current medication management strategy . Patient self care activities - Over the next 90 days, patient will: o Focus on medication adherence by continuing to use pill box.  o Take medications as prescribed o Report any questions or concerns to PharmD and/or provider(s)  Please see past updates related to this goal by clicking on the "Past Updates" button in the selected goal         The patient verbalized understanding of instructions, educational materials, and care plan provided today and declined offer to receive copy of patient instructions, educational materials, and care plan.   Telephone follow up appointment with pharmacy team member scheduled for: 11/2020  Sherre Poot, PharmD, [facility] Clinical Pharmacist [facility] [phone] (office) [phone] (mobile)   Blood Glucose Monitoring, Adult Monitoring your blood sugar (glucose) is an important part of managing your diabetes (diabetes mellitus). Blood glucose monitoring involves checking your blood glucose as often as directed and keeping a record (log) of your results over time. Checking your blood  glucose regularly and keeping a blood glucose log can:  Help you and your health care provider adjust your diabetes management plan as needed, including your medicines or insulin.  Help you understand how food, exercise, illnesses, and medicines affect your blood glucose.  Let you know what your blood glucose is at any time. You can quickly find out if you have low blood glucose (hypoglycemia) or high blood glucose (hyperglycemia). Your health care provider will set individualized treatment goals for you. Your goals will be based on your age, other medical conditions you have, and how you respond to diabetes treatment. Generally, the goal of treatment is to maintain the following blood glucose levels:  Before meals (preprandial): 80-130 mg/dL (4.4-7.2 mmol/L).  After meals (postprandial): below 180 mg/dL (10 mmol/L).  A1c level: less than 7%. Supplies needed:  Blood glucose meter.  Test strips for your meter. Each meter has its own strips. You must use the strips that came with your meter.  A needle to prick your finger (lancet). Do not use a lancet more than one time.  A device that holds the lancet (lancing device).  A journal or log book to write down your results. How to check your blood glucose  1. Wash your hands with soap and water. 2. Prick the side of your finger (not the tip) with the lancet. Use a different finger each time. 3. Gently rub the finger until a small drop of blood appears. 4. Follow instructions that come with your meter for inserting the test strip, applying blood to the strip, and using your blood glucose meter. 5. Write down your result and any notes. Some meters allow you to use areas of your body other than your finger (alternative sites) to test your blood. The most common alternative sites are:  Forearm.  Thigh.  Palm of the hand. If you think you may have hypoglycemia, or if you have a history of not knowing when your blood glucose is getting low  (hypoglycemia unawareness), do not use alternative sites. Use your finger instead. Alternative sites may not be as accurate as the fingers, because blood flow is slower in these areas. This means that the result you get may be delayed, and it may be different from the result that you would get from your finger. Follow these instructions at home: Blood glucose log   Every time you check your blood glucose, write down your result. Also write down any notes about things that may be affecting your blood glucose, such as your diet and exercise for the day. This information can help you and your health care provider: ? Look for patterns in your blood glucose over time. ? Adjust your diabetes management plan as needed.  Check if your meter allows you to download your records to a computer. Most glucose meters store a record of glucose readings in the meter. If you have type 1 diabetes:  Check your blood glucose 2 or more times a day.  Also check your blood glucose: ? Before every insulin injection. ? Before and after exercise. ? Before meals. ? 2 hours after a meal. ? Occasionally between 2:00  a.m. and 3:00 a.m., as directed. ? Before potentially dangerous tasks, like driving or using heavy machinery. ? At bedtime.  You may need to check your blood glucose more often, up to 6-10 times a day, if you: ? Use an insulin pump. ? Need multiple daily injections (MDI). ? Have diabetes that is not well-controlled. ? Are ill. ? Have a history of severe hypoglycemia. ? Have hypoglycemia unawareness. If you have type 2 diabetes:  If you take insulin or other diabetes medicines, check your blood glucose 2 or more times a day.  If you are on intensive insulin therapy, check your blood glucose 4 or more times a day. Occasionally, you may also need to check between 2:00 a.m. and 3:00 a.m., as directed.  Also check your blood glucose: ? Before and after exercise. ? Before potentially dangerous tasks,  like driving or using heavy machinery.  You may need to check your blood glucose more often if: ? Your medicine is being adjusted. ? Your diabetes is not well-controlled. ? You are ill. General tips  Always keep your supplies with you.  If you have questions or need help, all blood glucose meters have a 24-hour "hotline" phone number that you can call. You may also contact your health care provider.  After you use a few boxes of test strips, adjust (calibrate) your blood glucose meter by following instructions that came with your meter. Contact a health care provider if:  Your blood glucose is at or above 240 mg/dL (13.3 mmol/L) for 2 days in a row.  You have been sick or have had a fever for 2 days or longer, and you are not getting better.  You have any of the following problems for more than 6 hours: ? You cannot eat or drink. ? You have nausea or vomiting. ? You have diarrhea. Get help right away if:  Your blood glucose is lower than 54 mg/dL (3 mmol/L).  You become confused or you have trouble thinking clearly.  You have difficulty breathing.  You have moderate or large ketone levels in your urine. Summary  Monitoring your blood sugar (glucose) is an important part of managing your diabetes (diabetes mellitus).  Blood glucose monitoring involves checking your blood glucose as often as directed and keeping a record (log) of your results over time.  Your health care provider will set individualized treatment goals for you. Your goals will be based on your age, other medical conditions you have, and how you respond to diabetes treatment.  Every time you check your blood glucose, write down your result. Also write down any notes about things that may be affecting your blood glucose, such as your diet and exercise for the day. This information is not intended to replace advice given to you by your health care provider. Make sure you discuss any questions you have with your  health care provider. Document Revised: 04/25/2018 Document Reviewed: 12/12/2015 Elsevier Patient Education  2020 [employer].

## 2020-06-06 LAB — HM DIABETES EYE EXAM

## 2020-06-13 ENCOUNTER — Ambulatory Visit (INDEPENDENT_AMBULATORY_CARE_PROVIDER_SITE_OTHER): Payer: Medicare Other | Admitting: Family Medicine

## 2020-06-13 ENCOUNTER — Other Ambulatory Visit: Payer: Self-pay

## 2020-06-13 ENCOUNTER — Encounter: Payer: Self-pay | Admitting: Family Medicine

## 2020-06-13 VITALS — BP 170/70 | HR 86 | Temp 97.5°F | Resp 18 | Ht 64.5 in | Wt 263.2 lb

## 2020-06-13 DIAGNOSIS — Z0000 Encounter for general adult medical examination without abnormal findings: Secondary | ICD-10-CM | POA: Diagnosis not present

## 2020-06-13 MED ORDER — HYDROCODONE-ACETAMINOPHEN 7.5-325 MG PO TABS: 1.0000 | ORAL_TABLET | Freq: Three times a day (TID) | ORAL | 0 refills | Status: DC | PRN

## 2020-06-13 NOTE — Patient Instructions (Addendum)
List the Names of Other Physician/Practitioners you currently use: 1.  Dana [name] 2. Dana [name] apnea 3. Dana [name] at heart failure clinic 4. Dana [name]-cardiology-cholesterol clinic 5. Dana [name] 6. Dana [name] 7. Dana. Smith-endocrinologist-[name]  Dana [name] , Thank you for taking time to come for your Medicare Wellness Visit. I appreciate your ongoing commitment to your health goals. Please review the following plan we discussed and let me know if I can assist you in the future.   These are the goals we discussed: check blood pressure Goals    . Pharmacy Care Plan     CARE PLAN ENTRY (see longitudinal plan of care for additional care plan information)  Current Barriers:  . Chronic Disease Management support, education, and care coordination needs related to Hypertension, Hyperlipidemia, and Diabetes   Hypertension BP Readings from Last 3 Encounters:  04/05/20 132/68  03/23/20 140/60  03/18/20 (!) 160/60   . Pharmacist Clinical Goal(s): o Over the next 90 days, patient will work with PharmD and providers to maintain BP goal <130/80 . Current regimen:  o Hydralazine 100 mg tid  . Interventions: o Continue healthy diet each day.  o Keep up the good work riding exercise bike with goal of 150 minutes each week.  . Patient self care activities - Over the next 90 days, patient will: o Check BP daily, document, and provide at future appointments o Ensure daily salt intake < 2300 mg/day  Hyperlipidemia Lab Results  Component Value Date/Time   LDLCALC 76 03/23/2020 11:01 AM   . Pharmacist Clinical Goal(s): o Over the next 90 days, patient will work with PharmD and providers to achieve LDL goal < 70 . Current regimen:  o Repatha 140 mg/ml q14 days o Ezetimibe 10 mg daily o Omega 3 fatty acids daily . Interventions: o Discussed LDL increased since statin discontinued. Patient will see Dana. Debara Pickett in January and  wait for his recommendation. . Patient self care activities - Over the next 90 days, patient will: o Continue to take medications as prescribed.  o Contact pharmacist or provider with any questions or conerns.   Diabetes Lab Results  Component Value Date/Time   HGBA1C 6.7 (H) 03/23/2020 11:01 AM   . Pharmacist Clinical Goal(s): o Over the next 90 days, patient will work with PharmD and providers to maintain A1c goal <7% . Current regimen:  o Farxiga 10 mg daily before breakfast o Humulin N 20 units twice daily before a meal o Humulin R 6-12 units three times daily before meals.  . Interventions: o Encouraged patient to keep up the good work with riding exercise bike.  o Discussed regular meal schedule to avoid low blood sugar.  o Discussed appropriate ways to correct low blood sugar.  o Discussed keeping up the good work with healthy diet and consistent meal times.  . Patient self care activities - Over the next 90 days, patient will: o Check blood sugar 3-4 times daily, document, and provide at future appointments o Contact provider with any episodes of hypoglycemia  Medication management . Pharmacist Clinical Goal(s): o Over the next 90 days, patient will work with PharmD and providers to maintain optimal medication adherence . Current pharmacy: [facility] . Interventions o Comprehensive medication review performed. o Continue current medication management strategy . Patient self care activities - Over the next 90 days, patient will: o Focus on medication adherence by continuing to use pill box.  o Take medications as prescribed o Report any questions or  concerns to PharmD and/or provider(s)  Please see past updates related to this goal by clicking on the "Past Updates" button in the selected goal         This is a list of the screening recommended for you and due dates:  Health Maintenance  Topic Date Due  .  Hepatitis C: One time screening is recommended by  Center for Disease Control  (CDC) for  adults born from [birth year] through 1963.   Never done  . COVID-19 Vaccine (1) Never done  . Tetanus Vaccine  Never done  . Mammogram  06/24/2020  . Hemoglobin A1C  09/20/2020  . Complete foot exam   03/23/2021  . Eye exam for diabetics  05/30/2021  . Colon Cancer Screening  04/03/2026  . Flu Shot  Completed  . Pneumococcal vaccine  Completed  . HIV Screening  Completed  . Pap Smear  Discontinued

## 2020-06-13 NOTE — Progress Notes (Signed)
Subjective:    Dana [name] is a 59 y.o. female who presents for Medicare Annual/Subsequent preventive examination.  Preventive Screening-Counseling & Management  Tobacco Social History   Tobacco Use  Smoking Status Former Smoker  Smokeless Tobacco Never Used     Current Problems (verified) Patient Active Problem List   Diagnosis Date Noted  . Chronic respiratory failure with hypoxia ([place]) 03/23/2020  . Chronic gout without tophus 03/23/2020  . Pain in both hands 03/23/2020  . Uncomplicated opioid dependence ([place]) 03/23/2020  . Depression, major, recurrent, mild ([place]) 12/21/2019  . Hypomagnesemia 12/21/2019  . Diarrhea 12/21/2019  . Lumbar back pain 10/05/2019  . Diabetic nephropathy associated with type 2 diabetes mellitus ([place]) 10/05/2019  . Hypertensive heart and renal disease with congestive heart failure ([place]) 09/18/2019  . Demand ischemia ([place])   . Diastolic heart failure ([place]) 11/07/2017  . Morbid obesity with BMI of 45.0-49.9, adult ([place]) 11/07/2017  . Coronary artery disease involving native coronary artery of native heart without angina pectoris 12/25/2016  . Mixed hyperlipidemia 12/14/2016  . Anemia 03/23/2016  . Chronic pain syndrome 03/23/2016  . COPD (chronic obstructive pulmonary disease) ([place]) 03/23/2016  . GERD without esophagitis 03/23/2016  . On home oxygen therapy 03/23/2016  . S/P ablation of atrial fibrillation 10/04/2015  . Diabetic polyneuropathy associated with type 2 diabetes mellitus ([employer]) 04/02/2014    Medications Prior to Visit Current Outpatient Medications on File Prior to Visit  Medication Sig Dispense Refill  . allopurinol (ZYLOPRIM) 100 MG tablet Take 2 tablets (200 mg total) by mouth daily. 180 tablet 3  . aspirin 81 MG chewable tablet Chew 81 mg by mouth daily.    . carvedilol (COREG) 6.25 MG tablet Take 1.5 tablets (9.375 mg total) by mouth 2 (two) times daily. 90 tablet 3  . clopidogrel (PLAVIX) 75 MG tablet Take 1 tablet (75 mg  total) by mouth at bedtime. 90 tablet 3  . dapagliflozin propanediol (FARXIGA) 10 MG TABS tablet Take 1 tablet (10 mg total) by mouth daily before breakfast. 30 tablet 11  . EPINEPHrine 0.3 mg/0.3 mL IJ SOAJ injection Use as directed for life-threatening allergic reaction. 4 Device 3  . Evolocumab (REPATHA SURECLICK) 240 MG/ML SOAJ Inject 1 Dose into the skin every 14 (fourteen) days. 2 pen 11  . ezetimibe (ZETIA) 10 MG tablet Take 1 tablet (10 mg total) by mouth daily. 90 tablet 3  . famotidine (PEPCID) 20 MG tablet Take 1 tablet (20 mg total) by mouth daily. 30 tablet 5  . ferrous sulfate 325 (65 FE) MG tablet Take 325 mg by mouth 2 (two) times daily with a meal.    . hydrALAZINE (APRESOLINE) 100 MG tablet Take 1 tablet (100 mg total) by mouth 3 (three) times daily. 270 tablet 3  . HYDROcodone-acetaminophen (NORCO) 7.5-325 MG tablet Take 1 tablet by mouth 3 (three) times daily as needed for moderate pain. 90 tablet 0  . insulin NPH Human (HUMULIN N,NOVOLIN N) 100 UNIT/ML injection Inject 20 Units into the skin 2 (two) times daily before a meal.     . insulin regular (NOVOLIN R,HUMULIN R) 100 units/mL injection Inject 6-12 Units into the skin 3 (three) times daily before meals. Per sliding scale    . isosorbide mononitrate (IMDUR) 30 MG 24 hr tablet Take 1 tablet (30 mg total) by mouth daily. 90 tablet 3  . latanoprost (XALATAN) 0.005 % ophthalmic solution Place 1 drop into both eyes at bedtime.    Marland Kitchen LORazepam (ATIVAN) 0.5 MG tablet  Take 1 tablet (0.5 mg total) by mouth daily as needed for anxiety. 30 tablet 2  . magnesium oxide (MAG-OX) 400 MG tablet Take 2 tablets (800 mg total) by mouth daily. Take 2 (400 mg) daily=800 mg 60 tablet 1  . metolazone (ZAROXOLYN) 2.5 MG tablet Take 1 tablet (2.5 mg total) by mouth as needed. 30 tablet 3  . nitroGLYCERIN (NITROSTAT) 0.4 MG SL tablet Place 1 tablet (0.4 mg total) under the tongue every 5 (five) minutes as needed for chest pain. 15 tablet 2  . Omega-3  Fatty Acids (FISH OIL PO) Take 1 capsule by mouth daily.    . OXYGEN Inhale 2 L into the lungs at bedtime.    . Probiotic CAPS Take 1 capsule by mouth daily.    . promethazine (PHENERGAN) 25 MG tablet Take 1 tablet (25 mg total) by mouth every 6 (six) hours as needed for nausea or vomiting. 90 tablet 1  . spironolactone (ALDACTONE) 25 MG tablet TAKE 1 TABLET BY MOUTH  DAILY 30 tablet 11  . torsemide (DEMADEX) 20 MG tablet Take 1 tablet (20 mg total) by mouth as needed. 30 tablet 3   No current facility-administered medications on file prior to visit.    Current Medications (verified) Current Outpatient Medications  Medication Sig Dispense Refill  . allopurinol (ZYLOPRIM) 100 MG tablet Take 2 tablets (200 mg total) by mouth daily. 180 tablet 3  . aspirin 81 MG chewable tablet Chew 81 mg by mouth daily.    . carvedilol (COREG) 6.25 MG tablet Take 1.5 tablets (9.375 mg total) by mouth 2 (two) times daily. 90 tablet 3  . clopidogrel (PLAVIX) 75 MG tablet Take 1 tablet (75 mg total) by mouth at bedtime. 90 tablet 3  . dapagliflozin propanediol (FARXIGA) 10 MG TABS tablet Take 1 tablet (10 mg total) by mouth daily before breakfast. 30 tablet 11  . EPINEPHrine 0.3 mg/0.3 mL IJ SOAJ injection Use as directed for life-threatening allergic reaction. 4 Device 3  . Evolocumab (REPATHA SURECLICK) 209 MG/ML SOAJ Inject 1 Dose into the skin every 14 (fourteen) days. 2 pen 11  . ezetimibe (ZETIA) 10 MG tablet Take 1 tablet (10 mg total) by mouth daily. 90 tablet 3  . famotidine (PEPCID) 20 MG tablet Take 1 tablet (20 mg total) by mouth daily. 30 tablet 5  . ferrous sulfate 325 (65 FE) MG tablet Take 325 mg by mouth 2 (two) times daily with a meal.    . hydrALAZINE (APRESOLINE) 100 MG tablet Take 1 tablet (100 mg total) by mouth 3 (three) times daily. 270 tablet 3  . HYDROcodone-acetaminophen (NORCO) 7.5-325 MG tablet Take 1 tablet by mouth 3 (three) times daily as needed for moderate pain. 90 tablet 0  .  insulin NPH Human (HUMULIN N,NOVOLIN N) 100 UNIT/ML injection Inject 20 Units into the skin 2 (two) times daily before a meal.     . insulin regular (NOVOLIN R,HUMULIN R) 100 units/mL injection Inject 6-12 Units into the skin 3 (three) times daily before meals. Per sliding scale    . isosorbide mononitrate (IMDUR) 30 MG 24 hr tablet Take 1 tablet (30 mg total) by mouth daily. 90 tablet 3  . latanoprost (XALATAN) 0.005 % ophthalmic solution Place 1 drop into both eyes at bedtime.    Marland Kitchen LORazepam (ATIVAN) 0.5 MG tablet Take 1 tablet (0.5 mg total) by mouth daily as needed for anxiety. 30 tablet 2  . magnesium oxide (MAG-OX) 400 MG tablet Take 2 tablets (800 mg  total) by mouth daily. Take 2 (400 mg) daily=800 mg 60 tablet 1  . metolazone (ZAROXOLYN) 2.5 MG tablet Take 1 tablet (2.5 mg total) by mouth as needed. 30 tablet 3  . nitroGLYCERIN (NITROSTAT) 0.4 MG SL tablet Place 1 tablet (0.4 mg total) under the tongue every 5 (five) minutes as needed for chest pain. 15 tablet 2  . Omega-3 Fatty Acids (FISH OIL PO) Take 1 capsule by mouth daily.    . OXYGEN Inhale 2 L into the lungs at bedtime.    . Probiotic CAPS Take 1 capsule by mouth daily.    . promethazine (PHENERGAN) 25 MG tablet Take 1 tablet (25 mg total) by mouth every 6 (six) hours as needed for nausea or vomiting. 90 tablet 1  . spironolactone (ALDACTONE) 25 MG tablet TAKE 1 TABLET BY MOUTH  DAILY 30 tablet 11  . torsemide (DEMADEX) 20 MG tablet Take 1 tablet (20 mg total) by mouth as needed. 30 tablet 3   No current facility-administered medications for this visit.     Allergies (verified) Naproxen, Celecoxib, Aspirin, Glucosamine, Metoclopramide, Metolazone, Shellfish-derived products, and Tramadol   PAST HISTORY  Family History Family History  Problem Relation Age of Onset  . Hypertension Father   . Coronary artery disease Mother   . Hypertension Mother   . Hyperlipidemia Mother   . Diabetes type II Mother   . Breast cancer  Mother   . Lung cancer Mother   . Bipolar disorder Other   . Depression Other   . Schizophrenia Other     Social History Social History   Tobacco Use  . Smoking status: Former Research scientist (life sciences)  . Smokeless tobacco: Never Used  Substance Use Topics  . Alcohol use: Not Currently     Are there smokers in your home -no longer smoking-care for mother-smoker in her home-lung cancer stage 3-finished chem/radiation-Jan immune therapy-non operable Risk Factors Current exercise habits: free weights, sitting exercise, stationary bike-ride 20 miin/day Dietary issues discussed: cook own meals  Cardiac risk factors: CAD/CHF-last admission 3 years ago  Depression Screen -PHQ0 Activities of Daily Living In your present state of health, do you have any difficulty performing the following activities?:  Driving?  No difficulty Managing money?  No difficulty Feeding yourself? No difficulty Getting from bed to chair? No difficulty Climbing a flight of stairs? SOB with exertion-laundry in basement-bad pain day-pt does not need to laundry Preparing food and eating?:  No difficulty Bathing or showering? Walk in shower Getting dressed:  No difficulty Getting to the toilet? No difficulty Using the toilet: gastroparesis  Moving around from place to place: No difficulty In the past year have you fallen or had a near fall?: Multiple times-usually in home-no sensation in home Scooter in [place]. Cane in grocery. PT this year for strength  Do you have more than one partner? Yes-25years  Hearing Difficulties: no concerns  Cognitive Testing  No concerns    Advanced Directives have been discussed with the patient? yes  List the Names of Other Physician/Practitioners you currently use: 1.  [facility] 2. Dr. Neena Rhymes apnea 3. Dr. Bensimon-cardiology at heart failure clinic 4. Dr. Hilty-cardiology-cholesterol clinic 5. Dr Koch-GI-gastroporesis 6. Dr. A-nephrologist 7. Dr.  Smith-endocrinologist-[place]-DM  Immunization History  Administered Date(s) Administered  . Influenza Inj Mdck Quad Pf 03/23/2020  . Influenza, Quadrivalent, Recombinant, Inj, Pf 05/21/2018  . Influenza,inj,Quad PF,6+ Mos 03/28/2015  . Influenza-Unspecified 04/15/2014, 05/16/2016, 03/12/2019  . Pneumococcal Conjugate-13 05/16/2014  . Pneumococcal Polysaccharide-23 05/18/2018  Screening Tests Health Maintenance  Topic Date Due  . Hepatitis C Screening  Never done  . COVID-19 Vaccine (1) Never done  . TETANUS/TDAP  Never done  . MAMMOGRAM  06/24/2020  . HEMOGLOBIN A1C  09/20/2020  . FOOT EXAM  03/23/2021  . OPHTHALMOLOGY EXAM  05/30/2021  . COLONOSCOPY  04/03/2026  . INFLUENZA VACCINE  Completed  . PNEUMOCOCCAL POLYSACCHARIDE VACCINE AGE 50-64 HIGH RISK  Completed  . HIV Screening  Completed  . PAP SMEAR-Modifier  Discontinued    All answers were reviewed with the patient and necessary referrals were made:  Mertha Baars, MD   06/13/2020    Objective:     Body mass index is 44.48 kg/m. BP (!) 170/70   Pulse 86   Temp (!) 97.5 F (36.4 C)   Resp 18   Ht 5' 4.5" (1.638 m)   Wt 263 lb 3.2 oz (119.4 kg)   SpO2 99%   BMI 44.48 kg/m       Assessment:    1. Medicare annual wellness visit, subsequent  Plan:   continue to check blood pressure Medicare Attestation I have personally reviewed: The patient's medical and social history Their use of alcohol, tobacco or illicit drugs Their current medications and supplements The patient's functional ability including ADLs,fall risks, home safety risks, cognitive, and hearing and visual impairment Diet and physical activities Evidence for depression or mood disorders  The patient's weight, height, BMI, and visual acuity have been recorded in the chart.  I have made referrals, counseling, and provided education to the patient based on review of the above and I have provided the patient with a written personalized  care plan for preventive services.    Mertha Baars, MD   06/13/2020

## 2020-06-17 ENCOUNTER — Other Ambulatory Visit (HOSPITAL_COMMUNITY): Payer: Self-pay | Admitting: Adult Health

## 2020-06-23 ENCOUNTER — Other Ambulatory Visit: Payer: Self-pay | Admitting: Family Medicine

## 2020-06-27 ENCOUNTER — Telehealth: Payer: Self-pay | Admitting: Internal Medicine

## 2020-06-27 NOTE — Telephone Encounter (Signed)
PA for repatha sureclick submitted via CMM (Key: [id number])

## 2020-06-27 NOTE — Telephone Encounter (Signed)
Request Reference Number: [id number]. REPATHA SURE INJ 140MG /ML is approved through 07/15/2021

## 2020-06-27 NOTE — Progress Notes (Signed)
Subjective:  Patient ID: Dana [name], female    DOB: 07-03-1961  Age: 59 y.o. MRN: [id number]  Chief Complaint  Patient presents with  . Hypertension  . Diabetes    HPI Hypertensive heart disease: taking imdur and hydralizine. At home readings 130's/70's  Diabetes with numerous complications, neuropathy-taking farxiga, novolin N and R daily, checks sugar numerous times daily. FBS ranges 100-265, checks feet daily and has annual eye exams Hyperlipidemia: controlled with repatha, zetia and omega 3, eats a high protein/low carb and low sodium diet  Abnormal joint pain: elevated rheumatoid factor. Saw rheumatology. I do not have a note from them, so will request it. CAD- taking ASA, Coreg and Plavix. Follows with cardiology. Dr. Eliezer Lofts: discontinued torsemide and metolazone. GERD- controlled well with pepcid Gout-Allopurinol daily. No gout flares. Anemia- Ferrous sulfate two times daily Back pain-Norco three times daily Glaucoma-Latanoprost Anxiety-Ativan 0.5 mg once daily prn. Moderate depression. Hypomagnesium- Magnesium oxide. Nausea-Phenergan prn  Current Outpatient Medications on File Prior to Visit  Medication Sig Dispense Refill  . allopurinol (ZYLOPRIM) 100 MG tablet Take 2 tablets (200 mg total) by mouth daily. 180 tablet 3  . aspirin 81 MG chewable tablet Chew 81 mg by mouth daily.    . carvedilol (COREG) 6.25 MG tablet TAKE 1 AND 1/2 TABLETS BY  MOUTH TWICE DAILY 90 tablet 11  . clopidogrel (PLAVIX) 75 MG tablet Take 1 tablet (75 mg total) by mouth at bedtime. 90 tablet 3  . dapagliflozin propanediol (FARXIGA) 10 MG TABS tablet Take 1 tablet (10 mg total) by mouth daily before breakfast. 30 tablet 11  . EPINEPHrine 0.3 mg/0.3 mL IJ SOAJ injection Use as directed for life-threatening allergic reaction. 4 Device 3  . Evolocumab (REPATHA SURECLICK) 426 MG/ML SOAJ Inject 1 Dose into the skin every 14 (fourteen) days. 2 pen 11  . ezetimibe (ZETIA) 10 MG tablet Take 1  tablet (10 mg total) by mouth daily. 90 tablet 3  . famotidine (PEPCID) 20 MG tablet Take 1 tablet (20 mg total) by mouth daily. 30 tablet 5  . ferrous sulfate 325 (65 FE) MG tablet Take 325 mg by mouth 2 (two) times daily with a meal.    . hydrALAZINE (APRESOLINE) 100 MG tablet Take 1 tablet (100 mg total) by mouth 3 (three) times daily. 270 tablet 3  . HYDROcodone-acetaminophen (NORCO) 7.5-325 MG tablet Take 1 tablet by mouth 3 (three) times daily as needed for moderate pain. 90 tablet 0  . insulin NPH Human (HUMULIN N,NOVOLIN N) 100 UNIT/ML injection Inject 20 Units into the skin 2 (two) times daily before a meal.     . insulin regular (NOVOLIN R,HUMULIN R) 100 units/mL injection Inject 6-12 Units into the skin 3 (three) times daily before meals. Per sliding scale    . isosorbide mononitrate (IMDUR) 30 MG 24 hr tablet TAKE 1 TABLET BY MOUTH  DAILY 90 tablet 3  . latanoprost (XALATAN) 0.005 % ophthalmic solution Place 1 drop into both eyes at bedtime.    Marland Kitchen LORazepam (ATIVAN) 0.5 MG tablet Take 1 tablet (0.5 mg total) by mouth daily as needed for anxiety. 30 tablet 2  . magnesium oxide (MAG-OX) 400 MG tablet Take 2 tablets (800 mg total) by mouth daily. Take 2 (400 mg) daily=800 mg 60 tablet 1  . nitroGLYCERIN (NITROSTAT) 0.4 MG SL tablet Place 1 tablet (0.4 mg total) under the tongue every 5 (five) minutes as needed for chest pain. 15 tablet 2  . Omega-3 Fatty Acids (FISH OIL  PO) Take 1 capsule by mouth daily.    . OXYGEN Inhale 2 L into the lungs at bedtime.    . Probiotic CAPS Take 1 capsule by mouth daily.    . promethazine (PHENERGAN) 25 MG tablet Take 1 tablet (25 mg total) by mouth every 6 (six) hours as needed for nausea or vomiting. 90 tablet 1  . spironolactone (ALDACTONE) 25 MG tablet TAKE 1 TABLET BY MOUTH  DAILY 30 tablet 11   No current facility-administered medications on file prior to visit.   Past Medical History:  Diagnosis Date  . Acquired absence of left great toe ([place])    . Acquired absence of other left toe(s) ([place])   . Anginal pain ([place])   . Atherosclerotic heart disease of native coronary artery without angina pectoris   . CAD (coronary artery disease)    08/2016 DES to RCA, also with history of LAD stenting.   . Chronic diastolic (congestive) heart failure ([place])   . Chronic kidney disease, stage 2 (mild)   . Chronic systolic (congestive) heart failure ([place])   . Diabetes ([place])   . Diastolic CHF ([place])   . Gastro-esophageal reflux disease without esophagitis   . Hyperlipidemia   . Hypertension   . Hypertensive heart disease with heart failure ([place])   . Hypoxemia   . Idiopathic gout, multiple sites   . Localized edema   . Low back pain   . Mixed hyperlipidemia   . Mixed incontinence   . Morbid (severe) obesity due to excess calories ([place])   . OSA (obstructive sleep apnea)   . Other specified hypothyroidism   . Primary insomnia   . Type 2 diabetes mellitus with diabetic nephropathy [facility])    Past Surgical History:  Procedure Laterality Date  . ABDOMINAL HYSTERECTOMY     Partial- Still has both ovaries  . CARDIOVASCULAR STRESS TEST  08/13/2014   Nuclear; Normal  . CARPAL TUNNEL RELEASE    . CATARACT EXTRACTION, BILATERAL    . CESAREAN SECTION    . CHOLECYSTECTOMY    . CORONARY ANGIOPLASTY WITH STENT PLACEMENT     3 blockages/ 1 stent  . EYE SURGERY    . RIGHT/LEFT HEART CATH AND CORONARY ANGIOGRAPHY N/A 01/07/2017   Procedure: Right/Left Heart Cath and Coronary Angiography;  Surgeon: Larey Dresser, MD;  Location: [facility];  Service: Cardiovascular;  Laterality: N/A;  . ROTATOR CUFF REPAIR    . TOE AMPUTATION Left   . US ECHOCARDIOGRAPHY  05/2017   Normal    Family History  Problem Relation Age of Onset  . Hypertension Father   . Coronary artery disease Mother   . Hypertension Mother   . Hyperlipidemia Mother   . Diabetes type II Mother   . Breast cancer Mother   . Lung cancer Mother   . Bipolar disorder Other   .  Depression Other   . Schizophrenia Other    Social History   Socioeconomic History  . Marital status: Married    Spouse name: Not on file  . Number of children: Not on file  . Years of education: Not on file  . Highest education level: Not on file  Occupational History  . Not on file  Tobacco Use  . Smoking status: Former Research scientist (life sciences)  . Smokeless tobacco: Never Used  Vaping Use  . Vaping Use: Never used  Substance and Sexual Activity  . Alcohol use: Not Currently  . Drug use: Not Currently  . Sexual activity: Not  on file  Other Topics Concern  . Not on file  Social History Narrative  . Not on file   Social Determinants of Health   Financial Resource Strain: Not on file  Food Insecurity: No Food Insecurity  . Worried About Charity fundraiser in the Last Year: Never true  . Ran Out of Food in the Last Year: Never true  Transportation Needs: No Transportation Needs  . Lack of Transportation (Medical): No  . Lack of Transportation (Non-Medical): No  Physical Activity: Insufficiently Active  . Days of Exercise per Week: 3 days  . Minutes of Exercise per Session: 40 min  Stress: Not on file  Social Connections: Not on file    Review of Systems  Constitutional: Positive for fatigue. Negative for chills and fever.  HENT: Positive for congestion and rhinorrhea. Negative for ear pain and sore throat.   Respiratory: Negative for cough and shortness of breath.   Cardiovascular: Positive for palpitations (baseline). Negative for chest pain.  Gastrointestinal: Positive for abdominal pain (Gastroparesis), constipation (occasionally), diarrhea, nausea (gastroparesis) and vomiting (gastroparesis).  Genitourinary: Negative for dysuria and urgency.  Musculoskeletal: Positive for arthralgias, back pain and myalgias (calves).  Allergic/Immunologic: Positive for environmental allergies.  Neurological: Positive for weakness and headaches.   Objective:  BP (!) 168/74   Pulse 80   Temp  (!) 97.3 F (36.3 C)   Ht 5\' 5"  (1.651 m)   Wt 261 lb (118.4 kg)   SpO2 99%   BMI 43.43 kg/m   BP/Weight 06/28/2020 06/13/2020 03/09/538  Systolic BP 767 341 937  Diastolic BP 74 70 68  Wt. (Lbs) 261 263.2 252  BMI 43.43 44.48 41.93    Physical Exam Vitals reviewed.  Constitutional:      Appearance: Normal appearance. She is normal weight.  Neck:     Vascular: No carotid bruit.  Cardiovascular:     Rate and Rhythm: Normal rate and regular rhythm.     Pulses: Normal pulses.     Heart sounds: Normal heart sounds.  Pulmonary:     Effort: Pulmonary effort is normal. No respiratory distress.     Breath sounds: Normal breath sounds.  Abdominal:     General: Abdomen is flat. Bowel sounds are normal.     Palpations: Abdomen is soft.     Tenderness: There is no abdominal tenderness.  Neurological:     Mental Status: She is alert and oriented to person, place, and time.  Psychiatric:        Mood and Affect: Mood normal.        Behavior: Behavior normal.    Legs: compression stockings. Pt scheduled to said endocrinology next week. They will do a foot exam.   Lab Results  Component Value Date   WBC 11.8 (H) 03/23/2020   HGB 9.7 (L) 03/23/2020   HCT 29.8 (L) 03/23/2020   PLT 350 03/23/2020   GLUCOSE 121 (H) 04/11/2020   CHOL 147 03/23/2020   TRIG 124 03/23/2020   HDL 49 03/23/2020   LDLCALC 76 03/23/2020   ALT 15 04/11/2020   AST 15 04/11/2020   NA 140 04/11/2020   K 4.7 04/11/2020   CL 106 04/11/2020   CREATININE 1.54 (H) 04/11/2020   BUN 24 04/11/2020   CO2 20 04/11/2020   TSH 3.590 03/23/2020   INR 1.08 01/01/2017   HGBA1C 6.7 (H) 03/23/2020      Assessment & Plan:   1. Hypertensive heart disease with chronic diastolic congestive heart failure ([place])  Poorly controlled. Recommend increase imdur to 60 mg once daily in am.  Continue to work on eating a healthy diet and exercise.  Labs drawn today.  - Comprehensive metabolic panel  2. Mixed  hyperlipidemia Await labs to determine recommendations.  Continue to work on eating a healthy diet and exercise.  Labs drawn today.  - Lipid panel  3. Diabetic polyneuropathy associated with type 2 diabetes mellitus ([place]) Control: Fairly well Recommend check sugars qac fasting daily. Recommend check feet daily. Recommend annual eye exams. Medicines: Defer changes to endocrinology. Continue to work on eating a healthy diet and exercise.  Labs drawn today.   - Hemoglobin A1c - CBC with Differential/Platelet  4. GERD without esophagitis The current medical regimen is effective;  continue present plan and medications.  5. Arthralgia of both hands - Rheumatoid factor - HCV Ab w Reflex to Quant PCR  6. Screening for viral disease - hCV Ab w reflex to quant pcr.   7. Persistent atrial fibrillation (HCC) S/P ablation.   8. Hypomagnesemia  The current medical regimen is effective;  continue present plan and medications.  Orders Placed This Encounter  Procedures  . Lipid panel  . Hemoglobin A1c  . CBC with Differential/Platelet  . Comprehensive metabolic panel  . Rheumatoid factor  . HCV Ab w Reflex to Quant PCR    I spent 30 minutes dedicated to the care of this patient on the date of this encounter to include face-to-face time with the patient, as well as: chart review.   Follow-up: Return in about 3 months (around 09/26/2020) for fasting.  An After Visit Summary was printed and given to the patient.  [name], MD [name] [phone]

## 2020-06-28 ENCOUNTER — Other Ambulatory Visit (HOSPITAL_COMMUNITY): Payer: Self-pay | Admitting: Internal Medicine

## 2020-06-28 ENCOUNTER — Ambulatory Visit (INDEPENDENT_AMBULATORY_CARE_PROVIDER_SITE_OTHER): Payer: Medicare Other | Admitting: Family Medicine

## 2020-06-28 ENCOUNTER — Other Ambulatory Visit: Payer: Self-pay

## 2020-06-28 ENCOUNTER — Encounter: Payer: Self-pay | Admitting: Family Medicine

## 2020-06-28 ENCOUNTER — Ambulatory Visit (INDEPENDENT_AMBULATORY_CARE_PROVIDER_SITE_OTHER): Payer: Medicare Other

## 2020-06-28 VITALS — BP 168/74 | HR 80 | Temp 97.3°F | Ht 65.0 in | Wt 261.0 lb

## 2020-06-28 DIAGNOSIS — M25541 Pain in joints of right hand: Secondary | ICD-10-CM

## 2020-06-28 DIAGNOSIS — E1142 Type 2 diabetes mellitus with diabetic polyneuropathy: Secondary | ICD-10-CM | POA: Diagnosis not present

## 2020-06-28 DIAGNOSIS — E782 Mixed hyperlipidemia: Secondary | ICD-10-CM | POA: Diagnosis not present

## 2020-06-28 DIAGNOSIS — I110 Hypertensive heart disease with heart failure: Secondary | ICD-10-CM | POA: Diagnosis not present

## 2020-06-28 DIAGNOSIS — I10 Essential (primary) hypertension: Secondary | ICD-10-CM | POA: Diagnosis not present

## 2020-06-28 DIAGNOSIS — M25542 Pain in joints of left hand: Secondary | ICD-10-CM

## 2020-06-28 DIAGNOSIS — Z23 Encounter for immunization: Secondary | ICD-10-CM | POA: Insufficient documentation

## 2020-06-28 DIAGNOSIS — I4819 Other persistent atrial fibrillation: Secondary | ICD-10-CM

## 2020-06-28 DIAGNOSIS — I5032 Chronic diastolic (congestive) heart failure: Secondary | ICD-10-CM

## 2020-06-28 DIAGNOSIS — K219 Gastro-esophageal reflux disease without esophagitis: Secondary | ICD-10-CM | POA: Diagnosis not present

## 2020-06-28 DIAGNOSIS — Z1159 Encounter for screening for other viral diseases: Secondary | ICD-10-CM

## 2020-06-28 MED ORDER — ISOSORBIDE MONONITRATE ER 60 MG PO TB24: 60.0000 mg | ORAL_TABLET | Freq: Every day | ORAL | 0 refills | Status: DC

## 2020-06-28 NOTE — Progress Notes (Signed)
   Covid-19 Vaccination Clinic  Name:  CECIL BIXBY    MRN: [id number] DOB: February 11, 1961  06/28/2020  Ms. Korol was observed post Covid-19 immunization for 15 minutes without incident. She was provided with Vaccine Information Sheet and instruction to access the V-Safe system.   Ms. Cavendish was instructed to call 911 with any severe reactions post vaccine: Marland Kitchen Difficulty breathing  . Swelling of face and throat  . A fast heartbeat  . A bad rash all over body  . Dizziness and weakness

## 2020-06-29 ENCOUNTER — Other Ambulatory Visit: Payer: Self-pay | Admitting: Internal Medicine

## 2020-06-29 LAB — COMPREHENSIVE METABOLIC PANEL
ALT: 13 IU/L (ref 0–32)
AST: 14 IU/L (ref 0–40)
Albumin/Globulin Ratio: 1.2 (ref 1.2–2.2)
Albumin: 3.7 g/dL — ABNORMAL LOW (ref 3.8–4.9)
Alkaline Phosphatase: 158 IU/L — ABNORMAL HIGH (ref 44–121)
BUN/Creatinine Ratio: 14 (ref 9–23)
BUN: 22 mg/dL (ref 6–24)
Bilirubin Total: 0.3 mg/dL (ref 0.0–1.2)
CO2: 24 mmol/L (ref 20–29)
Calcium: 8.8 mg/dL (ref 8.7–10.2)
Chloride: 106 mmol/L (ref 96–106)
Creatinine, Ser: 1.54 mg/dL — ABNORMAL HIGH (ref 0.57–1.00)
GFR calc Af Amer: 43 mL/min/{1.73_m2} — ABNORMAL LOW (ref >59)
GFR calc non Af Amer: 37 mL/min/{1.73_m2} — ABNORMAL LOW (ref >59)
Globulin, Total: 3.2 g/dL (ref 1.5–4.5)
Glucose: 89 mg/dL (ref 65–99)
Potassium: 4.4 mmol/L (ref 3.5–5.2)
Sodium: 143 mmol/L (ref 134–144)
Total Protein: 6.9 g/dL (ref 6.0–8.5)

## 2020-06-29 LAB — CBC WITH DIFFERENTIAL/PLATELET
Basophils Absolute: 0.1 10*3/uL (ref 0.0–0.2)
Basos: 1 %
EOS (ABSOLUTE): 0.3 10*3/uL (ref 0.0–0.4)
Eos: 2 %
Hematocrit: 27.8 % — ABNORMAL LOW (ref 34.0–46.6)
Hemoglobin: 9.1 g/dL — ABNORMAL LOW (ref 11.1–15.9)
Immature Grans (Abs): 0 10*3/uL (ref 0.0–0.1)
Immature Granulocytes: 0 %
Lymphocytes Absolute: 1.9 10*3/uL (ref 0.7–3.1)
Lymphs: 16 %
MCH: 27 pg (ref 26.6–33.0)
MCHC: 32.7 g/dL (ref 31.5–35.7)
MCV: 83 fL (ref 79–97)
Monocytes Absolute: 0.7 10*3/uL (ref 0.1–0.9)
Monocytes: 6 %
Neutrophils Absolute: 9 10*3/uL — ABNORMAL HIGH (ref 1.4–7.0)
Neutrophils: 75 %
Platelets: 312 10*3/uL (ref 150–450)
RBC: 3.37 x10E6/uL — ABNORMAL LOW (ref 3.77–5.28)
RDW: 14.6 % (ref 11.7–15.4)
WBC: 12 10*3/uL — ABNORMAL HIGH (ref 3.4–10.8)

## 2020-06-29 LAB — HCV INTERPRETATION

## 2020-06-29 LAB — LIPID PANEL
Chol/HDL Ratio: 2.4 ratio (ref 0.0–4.4)
Cholesterol, Total: 121 mg/dL (ref 100–199)
HDL: 51 mg/dL (ref >39)
LDL Chol Calc (NIH): 51 mg/dL (ref 0–99)
Triglycerides: 100 mg/dL (ref 0–149)
VLDL Cholesterol Cal: 19 mg/dL (ref 5–40)

## 2020-06-29 LAB — HCV AB W REFLEX TO QUANT PCR: HCV Ab: <0.1 s/co ratio (ref 0.0–0.9)

## 2020-06-29 LAB — RHEUMATOID FACTOR: Rheumatoid fact SerPl-aCnc: 197.1 IU/mL — ABNORMAL HIGH (ref <14.0)

## 2020-06-29 LAB — HEMOGLOBIN A1C
Est. average glucose Bld gHb Est-mCnc: 146 mg/dL
Hgb A1c MFr Bld: 6.7 % — ABNORMAL HIGH (ref 4.8–5.6)

## 2020-06-29 LAB — CARDIOVASCULAR RISK ASSESSMENT

## 2020-06-29 NOTE — Telephone Encounter (Signed)
Rx has been sent to the pharmacy electronically. ° °

## 2020-07-01 ENCOUNTER — Telehealth: Payer: Self-pay

## 2020-07-01 NOTE — Telephone Encounter (Signed)
[facility] needs a referral to Dr. Jenna Luo (Nephrology) [address] [place].

## 2020-07-04 ENCOUNTER — Other Ambulatory Visit: Payer: Self-pay

## 2020-07-04 NOTE — Telephone Encounter (Signed)
Go ahead and send referral.

## 2020-07-06 ENCOUNTER — Telehealth: Payer: Self-pay

## 2020-07-06 DIAGNOSIS — E1142 Type 2 diabetes mellitus with diabetic polyneuropathy: Secondary | ICD-10-CM

## 2020-07-06 NOTE — Telephone Encounter (Signed)
Referral entered for Nephrology.

## 2020-07-18 ENCOUNTER — Other Ambulatory Visit: Payer: Self-pay

## 2020-07-18 MED ORDER — HYDROCODONE-ACETAMINOPHEN 7.5-325 MG PO TABS: 1.0000 | ORAL_TABLET | Freq: Three times a day (TID) | ORAL | 0 refills | Status: DC | PRN

## 2020-07-24 NOTE — Progress Notes (Signed)
Heart Failure TeleHealth Note  Due to national recommendations of social distancing due to [place] 19, Audio/video telehealth visit is felt to be most appropriate for this patient at this time.  See MyChart message from today for patient consent regarding telehealth for [employer]  Date:  07/24/2020   ID:  [name], DOB [birth year], MRN [id number]  Location: Home  Provider location: [facility] Type of Visit: Established patient   PCP:  [name], MD  Cardiologist:  No primary care provider on file. Primary HF:  Dr [name]   Chief Complaint: Heart Failure     History of Present Illness:  Dana [name] is a 60 y.o. female with obesity, diastolic heart failure, DM, neuropathy, HTN, hyperlipidemia, CAD (most recent [facility] 08/2016 DES RCA and stent LAD total of 4 stents), OSA on CPAP, PAF s/p ablation, PAD S/P L great toe and 4th toe amputation.   Echo 5/21 EF 60-65% RV ok. Grade II DD Personally reviewed  Zio patch 02/2018 (for possible syncope) with no arrhythmias, only occasional PACs/PVCs  She is s/p Cardiomems placement  She presents via audio/video conferencing for a telehealth visit today. Wilder Glade started earlier this year and has stopped torsemide and metolazone. Says she feels better than she has in a long time. Was in the car with her family on 12/30 and had sudden onset of CP that would not let up. Took 2 NTGs and it resolved. No CP since. Remains active. Volume status was up over the holidays but now back down. PAD on cardiomems ranging 7-10 (7 today). No orthopnea or PND.   She denies symptoms worrisome for COVID 19.   Past Medical History:  Diagnosis Date  . Acquired absence of left great toe ([place])   . Acquired absence of other left toe(s) ([place])   . Anginal pain ([place])   . Atherosclerotic heart disease of native coronary artery without angina pectoris   . CAD (coronary artery disease)    08/2016 DES to RCA, also with history of LAD  stenting.   . Chronic diastolic (congestive) heart failure ([place])   . Chronic kidney disease, stage 2 (mild)   . Chronic systolic (congestive) heart failure ([place])   . Diabetes ([place])   . Diastolic CHF ([place])   . Gastro-esophageal reflux disease without esophagitis   . Hyperlipidemia   . Hypertension   . Hypertensive heart disease with heart failure ([place])   . Hypoxemia   . Idiopathic gout, multiple sites   . Localized edema   . Low back pain   . Mixed hyperlipidemia   . Mixed incontinence   . Morbid (severe) obesity due to excess calories ([place])   . OSA (obstructive sleep apnea)   . Other specified hypothyroidism   . Primary insomnia   . Type 2 diabetes mellitus with diabetic nephropathy [facility])    Past Surgical History:  Procedure Laterality Date  . ABDOMINAL HYSTERECTOMY     Partial- Still has both ovaries  . CARDIOVASCULAR STRESS TEST  08/13/2014   Nuclear; Normal  . CARPAL TUNNEL RELEASE    . CATARACT EXTRACTION, BILATERAL    . CESAREAN SECTION    . CHOLECYSTECTOMY    . CORONARY ANGIOPLASTY WITH STENT PLACEMENT     3 blockages/ 1 stent  . EYE SURGERY    . RIGHT/LEFT HEART CATH AND CORONARY ANGIOGRAPHY N/A 01/07/2017   Procedure: Right/Left Heart Cath and Coronary Angiography;  Surgeon: Larey Dresser, MD;  Location: [facility];  Service: Cardiovascular;  Laterality: N/A;  . ROTATOR CUFF REPAIR    . TOE AMPUTATION Left   . US ECHOCARDIOGRAPHY  05/2017   Normal     Current Outpatient Medications  Medication Sig Dispense Refill  . allopurinol (ZYLOPRIM) 100 MG tablet TAKE 2 TABLETS BY MOUTH  DAILY 180 tablet 3  . aspirin 81 MG chewable tablet Chew 81 mg by mouth daily.    . carvedilol (COREG) 6.25 MG tablet TAKE 1 AND 1/2 TABLETS BY  MOUTH TWICE DAILY 90 tablet 11  . clopidogrel (PLAVIX) 75 MG tablet TAKE 1 TABLET BY MOUTH AT  BEDTIME 90 tablet 3  . dapagliflozin propanediol (FARXIGA) 10 MG TABS tablet Take 1 tablet (10 mg total) by mouth daily before breakfast.  30 tablet 11  . EPINEPHrine 0.3 mg/0.3 mL IJ SOAJ injection Use as directed for life-threatening allergic reaction. 4 Device 3  . Evolocumab (REPATHA SURECLICK) 518 MG/ML SOAJ Inject 1 Dose into the skin every 14 (fourteen) days. 2 pen 11  . ezetimibe (ZETIA) 10 MG tablet TAKE 1 TABLET BY MOUTH  DAILY 90 tablet 3  . famotidine (PEPCID) 20 MG tablet Take 1 tablet (20 mg total) by mouth daily. 30 tablet 5  . ferrous sulfate 325 (65 FE) MG tablet Take 325 mg by mouth 2 (two) times daily with a meal.    . hydrALAZINE (APRESOLINE) 100 MG tablet Take 1 tablet (100 mg total) by mouth 3 (three) times daily. 270 tablet 3  . HYDROcodone-acetaminophen (NORCO) 7.5-325 MG tablet Take 1 tablet by mouth 3 (three) times daily as needed for moderate pain. 90 tablet 0  . insulin NPH Human (HUMULIN N,NOVOLIN N) 100 UNIT/ML injection Inject 20 Units into the skin 2 (two) times daily before a meal.     . insulin regular (NOVOLIN R,HUMULIN R) 100 units/mL injection Inject 6-12 Units into the skin 3 (three) times daily before meals. Per sliding scale    . isosorbide mononitrate (IMDUR) 60 MG 24 hr tablet Take 1 tablet (60 mg total) by mouth daily. 90 tablet 0  . latanoprost (XALATAN) 0.005 % ophthalmic solution Place 1 drop into both eyes at bedtime.    Marland Kitchen LORazepam (ATIVAN) 0.5 MG tablet Take 1 tablet (0.5 mg total) by mouth daily as needed for anxiety. 30 tablet 2  . magnesium oxide (MAG-OX) 400 MG tablet Take 2 tablets (800 mg total) by mouth daily. Take 2 (400 mg) daily=800 mg 60 tablet 1  . nitroGLYCERIN (NITROSTAT) 0.4 MG SL tablet Place 1 tablet (0.4 mg total) under the tongue every 5 (five) minutes as needed for chest pain. 15 tablet 2  . Omega-3 Fatty Acids (FISH OIL PO) Take 1 capsule by mouth daily.    . OXYGEN Inhale 2 L into the lungs at bedtime.    . Probiotic CAPS Take 1 capsule by mouth daily.    . promethazine (PHENERGAN) 25 MG tablet Take 1 tablet (25 mg total) by mouth every 6 (six) hours as needed for  nausea or vomiting. 90 tablet 1  . spironolactone (ALDACTONE) 25 MG tablet TAKE 1 TABLET BY MOUTH  DAILY 30 tablet 11   No current facility-administered medications for this encounter.    Allergies:   Naproxen, Celecoxib, Aspirin, Glucosamine, Metoclopramide, Metolazone, Shellfish-derived products, and Tramadol   Social History:  The patient  reports that she has quit smoking. She has never used smokeless tobacco. She reports previous alcohol use. She reports previous drug use.   Family History:  The patient's family history includes Bipolar disorder in an other family member; Breast cancer in her mother; Coronary artery disease in her mother; Depression in an other family member; Diabetes type II in her mother; Hyperlipidemia in her mother; Hypertension in her father and mother; Lung cancer in her mother; Schizophrenia in an other family member.   ROS:  Please see the history of present illness.   All other systems are personally reviewed and negative.   BP 129/73 HR 82 Sat 99%  Exam:  Tele Health Call; Exam is subjective General:  Speaks in full sentences. No resp difficulty. Lungs: Normal respiratory effort with conversation.  Abdomen: Non-distended per patient report Extremities: Pt reports edema better with compression stockings.  Neuro: Alert & oriented x 3.   Recent Labs: 12/21/2019: Magnesium 1.9 03/23/2020: TSH 3.590 06/28/2020: ALT 13; BUN 22; Creatinine, Ser 1.54; Hemoglobin 9.1; Platelets 312; Potassium 4.4; Sodium 143  Personally reviewed   Wt Readings from Last 3 Encounters:  06/28/20 118.4 kg (261 lb)  06/13/20 119.4 kg (263 lb 3.2 oz)  04/05/20 114.3 kg (252 lb)      ASSESSMENT AND PLAN:  1. Chronic Diastolic Heart Failure - Echo 5/19 LVEF 65-70%, Grade 2 DD - s/p Cardiomems device 03/11/18. Cardiomems reading 49mmHG today, Goal 10.  - Improved NYHA IIIVolume status stable.  - Echo 5/21 EF 60-65% RV normal grade II DD Personally reviewed - Much improved. NYHA  II. Cardiomems PAD reading 7 today. Goal 10.  - Volume status stable with cardiomems on Farxiga and spio. Can use torsemide and metolazone as needed.   - Increase coreg to 12.5  mg twice a day.  - Continue hydralazine 100 mg three times a day -Continue imdur30mg  daily.  - Continue spiro 25 mg daily.  - Continue compression stockings.  2. Obesity Body mass index is 43.1 kg/m. - Discussed portion control 3. OSA  - Continue CPAP 4. CAD - previous DES to RCA and LAD in 2018 - One recent episode of CP but has not recurred. Knows to go to ER if getting worse. If gets more frequent will need repeat angiography - Continue plavix, bb, and statin. Will increase b-block - She has seen Dr. Debara Pickett in [facility]. Now on [place].   F/u 3 months  COVID screen The patient does not have any symptoms that suggest any further testing/ screening at this time.  Social distancing reinforced today.  Patient Risk: After full review of this patients clinical status, I feel that they are at moderate risk for cardiac decompensation at this time.  Relevant cardiac medications were reviewed at length with the patient today. The patient does not have concerns regarding their medications at this time.   Today, I have spent 14 minutes with the patient with telehealth technology discussing the above issues.    Signed, [name], MD  07/24/2020 7:51 PM  [place] [address] [place] [address] [phone] (office) [phone] (fax)

## 2020-07-25 ENCOUNTER — Encounter (HOSPITAL_COMMUNITY): Payer: Self-pay | Admitting: Internal Medicine

## 2020-07-25 ENCOUNTER — Telehealth (HOSPITAL_COMMUNITY): Payer: Self-pay

## 2020-07-25 ENCOUNTER — Ambulatory Visit (HOSPITAL_COMMUNITY)
Admission: RE | Admit: 2020-07-25 | Discharge: 2020-07-25 | Disposition: A | Discharge status: Home / Self Care | Payer: HMO | Source: Ambulatory Visit | Attending: Internal Medicine | Admitting: Internal Medicine

## 2020-07-25 ENCOUNTER — Other Ambulatory Visit: Payer: Self-pay

## 2020-07-25 VITALS — BP 129/73 | HR 82 | Wt 259.0 lb

## 2020-07-25 DIAGNOSIS — I2510 Atherosclerotic heart disease of native coronary artery without angina pectoris: Secondary | ICD-10-CM

## 2020-07-25 DIAGNOSIS — I5032 Chronic diastolic (congestive) heart failure: Secondary | ICD-10-CM

## 2020-07-25 DIAGNOSIS — G4733 Obstructive sleep apnea (adult) (pediatric): Secondary | ICD-10-CM | POA: Diagnosis not present

## 2020-07-25 DIAGNOSIS — I10 Essential (primary) hypertension: Secondary | ICD-10-CM | POA: Diagnosis not present

## 2020-07-25 MED ORDER — CARVEDILOL 12.5 MG PO TABS: 12.5000 mg | ORAL_TABLET | Freq: Two times a day (BID) | ORAL | 11 refills | Status: DC

## 2020-07-25 NOTE — Telephone Encounter (Signed)
  Patient Consent for Virtual Visit         Dana [name] has provided verbal consent on 07/25/2020 for a virtual visit (video or telephone).   CONSENT FOR VIRTUAL VISIT FOR:  Dana [name]  By participating in this virtual visit I agree to the following:  I hereby voluntarily request, consent and authorize [facility] and its employed or contracted physicians, physician assistants, nurse practitioners or other licensed health care professionals (the Practitioner), to provide me with telemedicine health care services (the "Services") as deemed necessary by the treating Practitioner. I acknowledge and consent to receive the Services by the Practitioner via telemedicine. I understand that the telemedicine visit will involve communicating with the Practitioner through live audiovisual communication technology and the disclosure of certain medical information by electronic transmission. I acknowledge that I have been given the opportunity to request an in-person assessment or other available alternative prior to the telemedicine visit and am voluntarily participating in the telemedicine visit.  I understand that I have the right to withhold or withdraw my consent to the use of telemedicine in the course of my care at any time, without affecting my right to future care or treatment, and that the Practitioner or I may terminate the telemedicine visit at any time. I understand that I have the right to inspect all information obtained and/or recorded in the course of the telemedicine visit and may receive copies of available information for a reasonable fee.  I understand that some of the potential risks of receiving the Services via telemedicine include:  Marland Kitchen Delay or interruption in medical evaluation due to technological equipment failure or disruption; . Information transmitted may not be sufficient (e.g. poor resolution of images) to allow for appropriate medical decision making by the Practitioner;  and/or  . In rare instances, security protocols could fail, causing a breach of personal health information.  Furthermore, I acknowledge that it is my responsibility to provide information about my medical history, conditions and care that is complete and accurate to the best of my ability. I acknowledge that Practitioner's advice, recommendations, and/or decision may be based on factors not within their control, such as incomplete or inaccurate data provided by me or distortions of diagnostic images or specimens that may result from electronic transmissions. I understand that the practice of medicine is not an exact science and that Practitioner makes no warranties or guarantees regarding treatment outcomes. I acknowledge that a copy of this consent can be made available to me via my patient portal ([facility]), or I can request a printed copy by calling the office of [facility].    I understand that my insurance will be billed for this visit.   I have read or had this consent read to me. . I understand the contents of this consent, which adequately explains the benefits and risks of the Services being provided via telemedicine.  . I have been provided ample opportunity to ask questions regarding this consent and the Services and have had my questions answered to my satisfaction. . I give my informed consent for the services to be provided through the use of telemedicine in my medical care

## 2020-07-25 NOTE — Progress Notes (Signed)
AVS sent via mychart and copy mailed to her

## 2020-07-25 NOTE — Patient Instructions (Signed)
Increase Carvedilol to 12.5 mg Twice daily, we have sent you in a new prescription for 12.5 mg tablet  Your physician recommends that you schedule a follow-up appointment in: 3 months, our office will call you to schedule this appointment  If you have any questions or concerns before your next appointment please send [name] a message through [facility] or call our office at [phone].    TO LEAVE A MESSAGE FOR THE NURSE SELECT OPTION 2, PLEASE LEAVE A MESSAGE INCLUDING: . YOUR NAME . DATE OF BIRTH . CALL BACK NUMBER . REASON FOR CALL**this is important as we prioritize the call backs  [place] AS LONG AS YOU CALL BEFORE 4:00 PM  At the [facility], you and your health needs are our priority. As part of our continuing mission to provide you with exceptional heart care, we have created designated Provider Care Teams. These Care Teams include your primary Cardiologist (physician) and Advanced Practice Providers (APPs- Physician Assistants and Nurse Practitioners) who all work together to provide you with the care you need, when you need it.   You may see any of the following providers on your designated Care Team at your next follow up: Marland Kitchen Dr [name] . Dr Loralie Champagne . Darrick Grinder, NP . Lyda Jester, PA . Audry Riles, PharmD   Please be sure to bring in all your medications bottles to every appointment.

## 2020-07-25 NOTE — Addendum Note (Signed)
Encounter addended by: Scarlette Calico, RN on: 07/25/2020 2:39 PM  Actions taken: Order list changed, Clinical Note Signed

## 2020-08-05 ENCOUNTER — Other Ambulatory Visit: Payer: Self-pay

## 2020-08-05 DIAGNOSIS — G4733 Obstructive sleep apnea (adult) (pediatric): Secondary | ICD-10-CM | POA: Diagnosis not present

## 2020-08-05 MED ORDER — PROMETHAZINE HCL 25 MG PO TABS: 25.0000 mg | ORAL_TABLET | Freq: Four times a day (QID) | ORAL | 1 refills | Status: DC | PRN

## 2020-08-15 ENCOUNTER — Ambulatory Visit: Payer: HMO | Admitting: Internal Medicine

## 2020-08-16 ENCOUNTER — Telehealth: Payer: Self-pay

## 2020-08-16 NOTE — Progress Notes (Signed)
Chronic Care Management Pharmacy Assistant   Name: ROSELLA CRANDELL  MRN: [id number] DOB: [birth year]  Reason for Encounter: Disease State call for diabetes, hypertension, and hyperlipidemia   Patient Questions:  1.  Have you seen any other providers since your last visit? Yes, 07/25/20-Cardiology-increased Carvedilol to 12.5mg  twice daily 07/11/20-Nephrology referral 06/28/20-PCP  2.  Any changes in your medicines or health? Yes, patient stated she has increased pain due to the colder temperatures.  07/25/20-Cardiology-increased Carvedilol to 12.5mg  twice daily   PCP : [name], MD  Allergies:   Allergies  Allergen Reactions  . Naproxen Hives and Rash  . Celecoxib Other (See Comments)    Stomach pain  . Aspirin Other (See Comments)    CAN NOT TAKE DUE TO ULCERS  . Glucosamine Hives, Swelling and Other (See Comments)    Angioedema   . Metoclopramide Other (See Comments)    Facial twitching and stuttering  . Metolazone Other (See Comments)    Acute renal failure  . Shellfish-Derived Products Hives, Swelling and Other (See Comments)    Angioedema   . Tramadol Nausea And Vomiting         Medications: Outpatient Encounter Medications as of 08/16/2020  Medication Sig Note  . allopurinol (ZYLOPRIM) 100 MG tablet TAKE 2 TABLETS BY MOUTH  DAILY   . aspirin 81 MG chewable tablet Chew 81 mg by mouth daily.   . carvedilol (COREG) 12.5 MG tablet Take 1 tablet (12.5 mg total) by mouth 2 (two) times daily.   . clopidogrel (PLAVIX) 75 MG tablet TAKE 1 TABLET BY MOUTH AT  BEDTIME   . dapagliflozin propanediol (FARXIGA) 10 MG TABS tablet Take 1 tablet (10 mg total) by mouth daily before breakfast.   . EPINEPHrine 0.3 mg/0.3 mL IJ SOAJ injection Use as directed for life-threatening allergic reaction.   . Evolocumab (REPATHA SURECLICK) 378 MG/ML SOAJ Inject 1 Dose into the skin every 14 (fourteen) days.   Marland Kitchen ezetimibe (ZETIA) 10 MG tablet TAKE 1 TABLET BY MOUTH  DAILY   . famotidine  (PEPCID) 20 MG tablet Take 1 tablet (20 mg total) by mouth daily.   . ferrous sulfate 325 (65 FE) MG tablet Take 325 mg by mouth 2 (two) times daily with a meal.   . hydrALAZINE (APRESOLINE) 100 MG tablet Take 1 tablet (100 mg total) by mouth 3 (three) times daily.   Marland Kitchen HYDROcodone-acetaminophen (NORCO) 7.5-325 MG tablet Take 1 tablet by mouth 3 (three) times daily as needed for moderate pain.   Marland Kitchen insulin NPH Human (HUMULIN N,NOVOLIN N) 100 UNIT/ML injection Inject 20 Units into the skin 2 (two) times daily before a meal.  03/04/2018: Novolin N  . insulin regular (NOVOLIN R,HUMULIN R) 100 units/mL injection Inject 6-12 Units into the skin 3 (three) times daily before meals. Per sliding scale 03/04/2018: Novolin R  . isosorbide mononitrate (IMDUR) 60 MG 24 hr tablet Take 1 tablet (60 mg total) by mouth daily.   Marland Kitchen latanoprost (XALATAN) 0.005 % ophthalmic solution Place 1 drop into both eyes at bedtime.   Marland Kitchen LORazepam (ATIVAN) 0.5 MG tablet Take 1 tablet (0.5 mg total) by mouth daily as needed for anxiety.   . magnesium oxide (MAG-OX) 400 MG tablet Take 2 tablets (800 mg total) by mouth daily. Take 2 (400 mg) daily=800 mg   . nitroGLYCERIN (NITROSTAT) 0.4 MG SL tablet Place 1 tablet (0.4 mg total) under the tongue every 5 (five) minutes as needed for chest pain.   . Omega-3 Fatty  Acids (FISH OIL PO) Take 1 capsule by mouth daily.   . OXYGEN Inhale 2 L into the lungs at bedtime.   . Probiotic CAPS Take 1 capsule by mouth daily.   . promethazine (PHENERGAN) 25 MG tablet Take 1 tablet (25 mg total) by mouth every 6 (six) hours as needed for nausea or vomiting.   Marland Kitchen spironolactone (ALDACTONE) 25 MG tablet TAKE 1 TABLET BY MOUTH  DAILY    No facility-administered encounter medications on file as of 08/16/2020.    Current Diagnosis: Patient Active Problem List   Diagnosis Date Noted  . Need for COVID-19 vaccine 06/28/2020  . Medicare annual wellness visit, subsequent 06/13/2020  . Chronic respiratory  failure with hypoxia ([place]) 03/23/2020  . Chronic gout without tophus 03/23/2020  . Pain in both hands 03/23/2020  . Uncomplicated opioid dependence ([place]) 03/23/2020  . Depression, major, recurrent, mild ([place]) 12/21/2019  . Hypomagnesemia 12/21/2019  . Diarrhea 12/21/2019  . Lumbar back pain 10/05/2019  . Diabetic nephropathy associated with type 2 diabetes mellitus ([place]) 10/05/2019  . Hypertensive heart and renal disease with congestive heart failure ([place]) 09/18/2019  . Demand ischemia ([place])   . Diastolic heart failure ([place]) 11/07/2017  . Morbid obesity with BMI of 45.0-49.9, adult ([place]) 11/07/2017  . Coronary artery disease involving native coronary artery of native heart without angina pectoris 12/25/2016  . Mixed hyperlipidemia 12/14/2016  . Anemia 03/23/2016  . Chronic pain syndrome 03/23/2016  . COPD (chronic obstructive pulmonary disease) ([place]) 03/23/2016  . GERD without esophagitis 03/23/2016  . On home oxygen therapy 03/23/2016  . S/P ablation of atrial fibrillation 10/04/2015  . Diabetic polyneuropathy associated with type 2 diabetes mellitus ([place]) 04/02/2014   Recent Relevant Labs: Lab Results  Component Value Date/Time   HGBA1C 6.7 (H) 06/28/2020 10:03 AM   HGBA1C 6.7 (H) 03/23/2020 11:01 AM    Kidney Function Lab Results  Component Value Date/Time   CREATININE 1.54 (H) 06/28/2020 10:03 AM   CREATININE 1.54 (H) 04/11/2020 10:08 AM   GFRNONAA 37 (L) 06/28/2020 10:03 AM   GFRAA 43 (L) 06/28/2020 10:03 AM    . Current antihyperglycemic regimen:  Farxiga Novolin N Novolin R  . What recent interventions/DTPs have been made to improve glycemic control:  Patient eats a high protein, low carb diet.  She takes medication as directed.  . Have there been any recent hospitalizations or ED visits since last visit with CPP? No   . Patient reports hypoglycemic symptoms, including headache   . Patient reports hyperglycemic symptoms, including excessive thirst   . How  often are you checking your blood sugar? twice daily . What are your blood sugars ranging? Patient stated her blood sugar readings are between 64-250.  Marland Kitchen During the week, how often does your blood glucose drop below 70? Once a week   . Are you checking your feet daily/regularly? Patient doesn't really check her feet often.   Adherence Review: Is the patient currently on a STATIN medication? No Is the patient currently on ACE/ARB medication? Yes Does the patient have >5 day gap between last estimated fill dates? No   Reviewed chart prior to disease state call. Spoke with patient regarding BP  Recent Office Vitals: BP Readings from Last 3 Encounters:  07/25/20 129/73  06/28/20 (!) 168/74  06/13/20 (!) 170/70   Pulse Readings from Last 3 Encounters:  07/25/20 82  06/28/20 80  06/13/20 86    Wt Readings from Last 3 Encounters:  07/25/20 259 lb (117.5 kg)  06/28/20 261 lb (118.4 kg)  06/13/20 263 lb 3.2 oz (119.4 kg)     Kidney Function Lab Results  Component Value Date/Time   CREATININE 1.54 (H) 06/28/2020 10:03 AM   CREATININE 1.54 (H) 04/11/2020 10:08 AM   GFRNONAA 37 (L) 06/28/2020 10:03 AM   GFRAA 43 (L) 06/28/2020 10:03 AM    BMP Latest Ref Rng & Units 06/28/2020 04/11/2020 03/23/2020  Glucose 65 - 99 mg/dL 89 121(H) 132(H)  BUN 6 - 24 mg/dL 22 24 28(H)  Creatinine 0.57 - 1.00 mg/dL 1.54(H) 1.54(H) 1.73(H)  BUN/Creat Ratio 9 - 23 14 16 16   Sodium 134 - 144 mmol/L 143 140 139  Potassium 3.5 - 5.2 mmol/L 4.4 4.7 4.5  Chloride 96 - 106 mmol/L 106 106 103  CO2 20 - 29 mmol/L 24 20 20   Calcium 8.7 - 10.2 mg/dL 8.8 9.0 8.6(L)    . Current antihypertensive regimen:  Imdur Hydralazine . How often are you checking your Blood Pressure? several times per month   . Current home BP readings: Patient stated her blood pressure readings are ranging between 115/68-120/74  . What recent interventions/DTPs have been made by any provider to improve Blood Pressure control since  last CPP Visit: Patient is taking her medications and watches her diet.  . Any recent hospitalizations or ED visits since last visit with CPP? No   . What diet changes have been made to improve Blood Pressure Control?  She does a high protein low carb diet, and with her new insurance she has a health coach/nutritionist.   . What exercise is being done to improve your Blood Pressure Control?  Patient is not very active due to increased pain with colder temperatures.   Adherence Review: Is the patient currently on ACE/ARB medication? Yes Does the patient have >5 day gap between last estimated fill dates? No   08/16/2020 Name: BELYNDA PAGADUAN MRN: [id number] DOB: [birth year] JAMONI BROADFOOT is a 60 y.o. year old female who is a primary care patient of Cox, Kirsten, MD.  Comprehensive medication review performed; Spoke to patient regarding cholesterol  Lipid Panel    Component Value Date/Time   CHOL 121 06/28/2020 1003   TRIG 100 06/28/2020 1003   HDL 51 06/28/2020 1003   LDLCALC 51 06/28/2020 1003    10-year ASCVD risk score: The ASCVD Risk score Mikey Bussing DC Jr., et al., 2013) failed to calculate for the following reasons:   The patient has a prior MI or stroke diagnosis  . Current antihyperlipidemic regimen:  Repatha Zetia Omega 3  . Previous antihyperlipidemic medications tried: None  . ASCVD risk enhancing conditions: DM, HTN and CKD   . What recent interventions/DTPs have been made by any provider to improve Cholesterol control since last CPP Visit: Patient eats healthy, takes her medications as directed.  . Any recent hospitalizations or ED visits since last visit with CPP? No   . What diet changes have been made to improve Cholesterol?  Patient has a health coach/nutritionist with her new insurance.  . What exercise is being done to improve Cholesterol?  Patient has limited activity due to increased pain with colder temperatures.  Adherence Review: Does the patient have >5  day gap between last estimated fill dates? No  Follow-Up:  Pharmacist Review  Donette Larry, CPP notified  Clarita Leber, [facility] Pharmacist Assistant [phone]

## 2020-08-22 ENCOUNTER — Other Ambulatory Visit: Payer: Self-pay

## 2020-08-22 MED ORDER — HYDROCODONE-ACETAMINOPHEN 7.5-325 MG PO TABS: 1.0000 | ORAL_TABLET | Freq: Three times a day (TID) | ORAL | 0 refills | Status: DC | PRN

## 2020-08-22 MED ORDER — LORAZEPAM 0.5 MG PO TABS: 0.5000 mg | ORAL_TABLET | Freq: Every day | ORAL | 2 refills | Status: DC | PRN

## 2020-08-23 DIAGNOSIS — E1042 Type 1 diabetes mellitus with diabetic polyneuropathy: Secondary | ICD-10-CM | POA: Diagnosis not present

## 2020-08-23 DIAGNOSIS — I10 Essential (primary) hypertension: Secondary | ICD-10-CM | POA: Diagnosis not present

## 2020-08-23 DIAGNOSIS — E782 Mixed hyperlipidemia: Secondary | ICD-10-CM | POA: Diagnosis not present

## 2020-08-23 DIAGNOSIS — E1065 Type 1 diabetes mellitus with hyperglycemia: Secondary | ICD-10-CM | POA: Diagnosis not present

## 2020-08-23 DIAGNOSIS — Z794 Long term (current) use of insulin: Secondary | ICD-10-CM | POA: Diagnosis not present

## 2020-08-29 ENCOUNTER — Emergency Department (HOSPITAL_COMMUNITY): Payer: HMO

## 2020-08-29 ENCOUNTER — Encounter (HOSPITAL_COMMUNITY): Payer: Self-pay | Admitting: Emergency Medicine

## 2020-08-29 ENCOUNTER — Observation Stay (HOSPITAL_BASED_OUTPATIENT_CLINIC_OR_DEPARTMENT_OTHER): Payer: HMO

## 2020-08-29 ENCOUNTER — Inpatient Hospital Stay (HOSPITAL_COMMUNITY)
Admission: EM | Admit: 2020-08-29 | Discharge: 2020-09-03 | DRG: 280 | Disposition: A | Discharge status: Home / Self Care | Payer: HMO | Attending: Internal Medicine | Admitting: Internal Medicine

## 2020-08-29 DIAGNOSIS — I130 Hypertensive heart and chronic kidney disease with heart failure and stage 1 through stage 4 chronic kidney disease, or unspecified chronic kidney disease: Principal | ICD-10-CM | POA: Diagnosis present

## 2020-08-29 DIAGNOSIS — Z79899 Other long term (current) drug therapy: Secondary | ICD-10-CM | POA: Diagnosis not present

## 2020-08-29 DIAGNOSIS — Z801 Family history of malignant neoplasm of trachea, bronchus and lung: Secondary | ICD-10-CM

## 2020-08-29 DIAGNOSIS — T508X5A Adverse effect of diagnostic agents, initial encounter: Secondary | ICD-10-CM | POA: Diagnosis present

## 2020-08-29 DIAGNOSIS — E118 Type 2 diabetes mellitus with unspecified complications: Secondary | ICD-10-CM | POA: Diagnosis present

## 2020-08-29 DIAGNOSIS — I5031 Acute diastolic (congestive) heart failure: Secondary | ICD-10-CM

## 2020-08-29 DIAGNOSIS — E1022 Type 1 diabetes mellitus with diabetic chronic kidney disease: Secondary | ICD-10-CM | POA: Diagnosis present

## 2020-08-29 DIAGNOSIS — D649 Anemia, unspecified: Secondary | ICD-10-CM | POA: Diagnosis present

## 2020-08-29 DIAGNOSIS — K219 Gastro-esophageal reflux disease without esophagitis: Secondary | ICD-10-CM | POA: Diagnosis present

## 2020-08-29 DIAGNOSIS — N189 Chronic kidney disease, unspecified: Secondary | ICD-10-CM | POA: Diagnosis present

## 2020-08-29 DIAGNOSIS — R079 Chest pain, unspecified: Secondary | ICD-10-CM | POA: Diagnosis not present

## 2020-08-29 DIAGNOSIS — G4733 Obstructive sleep apnea (adult) (pediatric): Secondary | ICD-10-CM | POA: Diagnosis present

## 2020-08-29 DIAGNOSIS — Z794 Long term (current) use of insulin: Secondary | ICD-10-CM

## 2020-08-29 DIAGNOSIS — D62 Acute posthemorrhagic anemia: Secondary | ICD-10-CM | POA: Diagnosis not present

## 2020-08-29 DIAGNOSIS — N183 Chronic kidney disease, stage 3 unspecified: Secondary | ICD-10-CM | POA: Diagnosis not present

## 2020-08-29 DIAGNOSIS — I5033 Acute on chronic diastolic (congestive) heart failure: Secondary | ICD-10-CM | POA: Diagnosis present

## 2020-08-29 DIAGNOSIS — N1411 Contrast-induced nephropathy: Secondary | ICD-10-CM | POA: Diagnosis not present

## 2020-08-29 DIAGNOSIS — D631 Anemia in chronic kidney disease: Secondary | ICD-10-CM | POA: Diagnosis present

## 2020-08-29 DIAGNOSIS — Z7982 Long term (current) use of aspirin: Secondary | ICD-10-CM

## 2020-08-29 DIAGNOSIS — E669 Obesity, unspecified: Secondary | ICD-10-CM | POA: Diagnosis present

## 2020-08-29 DIAGNOSIS — E782 Mixed hyperlipidemia: Secondary | ICD-10-CM | POA: Diagnosis present

## 2020-08-29 DIAGNOSIS — J90 Pleural effusion, not elsewhere classified: Secondary | ICD-10-CM | POA: Diagnosis not present

## 2020-08-29 DIAGNOSIS — R0602 Shortness of breath: Secondary | ICD-10-CM | POA: Diagnosis not present

## 2020-08-29 DIAGNOSIS — Z8249 Family history of ischemic heart disease and other diseases of the circulatory system: Secondary | ICD-10-CM | POA: Diagnosis not present

## 2020-08-29 DIAGNOSIS — E785 Hyperlipidemia, unspecified: Secondary | ICD-10-CM | POA: Diagnosis present

## 2020-08-29 DIAGNOSIS — D72829 Elevated white blood cell count, unspecified: Secondary | ICD-10-CM | POA: Diagnosis present

## 2020-08-29 DIAGNOSIS — Z833 Family history of diabetes mellitus: Secondary | ICD-10-CM

## 2020-08-29 DIAGNOSIS — N179 Acute kidney failure, unspecified: Secondary | ICD-10-CM | POA: Diagnosis not present

## 2020-08-29 DIAGNOSIS — Z888 Allergy status to other drugs, medicaments and biological substances status: Secondary | ICD-10-CM

## 2020-08-29 DIAGNOSIS — I214 Non-ST elevation (NSTEMI) myocardial infarction: Secondary | ICD-10-CM | POA: Diagnosis present

## 2020-08-29 DIAGNOSIS — I517 Cardiomegaly: Secondary | ICD-10-CM | POA: Diagnosis not present

## 2020-08-29 DIAGNOSIS — Z803 Family history of malignant neoplasm of breast: Secondary | ICD-10-CM

## 2020-08-29 DIAGNOSIS — Z6841 Body Mass Index (BMI) 40.0 and over, adult: Secondary | ICD-10-CM | POA: Diagnosis not present

## 2020-08-29 DIAGNOSIS — Z87891 Personal history of nicotine dependence: Secondary | ICD-10-CM

## 2020-08-29 DIAGNOSIS — E119 Type 2 diabetes mellitus without complications: Secondary | ICD-10-CM | POA: Diagnosis present

## 2020-08-29 DIAGNOSIS — I2510 Atherosclerotic heart disease of native coronary artery without angina pectoris: Secondary | ICD-10-CM | POA: Diagnosis present

## 2020-08-29 DIAGNOSIS — Z20822 Contact with and (suspected) exposure to covid-19: Secondary | ICD-10-CM | POA: Diagnosis present

## 2020-08-29 DIAGNOSIS — M1000 Idiopathic gout, unspecified site: Secondary | ICD-10-CM | POA: Diagnosis present

## 2020-08-29 DIAGNOSIS — I110 Hypertensive heart disease with heart failure: Secondary | ICD-10-CM | POA: Diagnosis not present

## 2020-08-29 DIAGNOSIS — Z7902 Long term (current) use of antithrombotics/antiplatelets: Secondary | ICD-10-CM

## 2020-08-29 DIAGNOSIS — Z9989 Dependence on other enabling machines and devices: Secondary | ICD-10-CM | POA: Diagnosis present

## 2020-08-29 DIAGNOSIS — Z89422 Acquired absence of other left toe(s): Secondary | ICD-10-CM

## 2020-08-29 DIAGNOSIS — D509 Iron deficiency anemia, unspecified: Secondary | ICD-10-CM | POA: Diagnosis present

## 2020-08-29 DIAGNOSIS — Z90710 Acquired absence of both cervix and uterus: Secondary | ICD-10-CM

## 2020-08-29 DIAGNOSIS — N1832 Chronic kidney disease, stage 3b: Secondary | ICD-10-CM | POA: Diagnosis present

## 2020-08-29 DIAGNOSIS — Z83438 Family history of other disorder of lipoprotein metabolism and other lipidemia: Secondary | ICD-10-CM

## 2020-08-29 DIAGNOSIS — Z885 Allergy status to narcotic agent status: Secondary | ICD-10-CM

## 2020-08-29 DIAGNOSIS — R040 Epistaxis: Secondary | ICD-10-CM | POA: Diagnosis not present

## 2020-08-29 DIAGNOSIS — N141 Nephropathy induced by other drugs, medicaments and biological substances: Secondary | ICD-10-CM | POA: Diagnosis not present

## 2020-08-29 DIAGNOSIS — Z886 Allergy status to analgesic agent status: Secondary | ICD-10-CM

## 2020-08-29 DIAGNOSIS — Z955 Presence of coronary angioplasty implant and graft: Secondary | ICD-10-CM

## 2020-08-29 LAB — GLUCOSE, CAPILLARY
Glucose-Capillary: 288 mg/dL — ABNORMAL HIGH (ref 70–99)
Glucose-Capillary: 264 mg/dL — ABNORMAL HIGH (ref 70–99)

## 2020-08-29 LAB — CBC
HCT: 30.8 % — ABNORMAL LOW (ref 36.0–46.0)
Hemoglobin: 9.6 g/dL — ABNORMAL LOW (ref 12.0–15.0)
MCH: 26.4 pg (ref 26.0–34.0)
MCHC: 31.2 g/dL (ref 30.0–36.0)
MCV: 84.6 fL (ref 80.0–100.0)
Platelets: 320 10*3/uL (ref 150–400)
RBC: 3.64 MIL/uL — ABNORMAL LOW (ref 3.87–5.11)
RDW: 15.4 % (ref 11.5–15.5)
WBC: 12.1 10*3/uL — ABNORMAL HIGH (ref 4.0–10.5)
nRBC: 0 % (ref 0.0–0.2)

## 2020-08-29 LAB — ECHOCARDIOGRAM COMPLETE
AR max vel: 1.8 cm2
AV Area VTI: 1.93 cm2
AV Area mean vel: 1.79 cm2
AV Mean grad: 4 mmHg
AV Peak grad: 6.6 mmHg
Ao pk vel: 1.28 m/s
Area-P 1/2: 3.37 cm2
MV M vel: 4.31 m/s
MV Peak grad: 74.3 mmHg
MV VTI: 1.55 cm2
S' Lateral: 3.9 cm

## 2020-08-29 LAB — BASIC METABOLIC PANEL
Anion gap: 10 (ref 5–15)
BUN: 34 mg/dL — ABNORMAL HIGH (ref 6–20)
CO2: 22 mmol/L (ref 22–32)
Calcium: 8.6 mg/dL — ABNORMAL LOW (ref 8.9–10.3)
Chloride: 106 mmol/L (ref 98–111)
Creatinine, Ser: 1.87 mg/dL — ABNORMAL HIGH (ref 0.44–1.00)
GFR, Estimated: 31 mL/min — ABNORMAL LOW (ref >60)
Glucose, Bld: 226 mg/dL — ABNORMAL HIGH (ref 70–99)
Potassium: 4.1 mmol/L (ref 3.5–5.1)
Sodium: 138 mmol/L (ref 135–145)

## 2020-08-29 LAB — I-STAT BETA HCG BLOOD, ED (MC, WL, AP ONLY): I-stat hCG, quantitative: <5 m[IU]/mL (ref <5)

## 2020-08-29 LAB — CBG MONITORING, ED
Glucose-Capillary: 234 mg/dL — ABNORMAL HIGH (ref 70–99)
Glucose-Capillary: 246 mg/dL — ABNORMAL HIGH (ref 70–99)

## 2020-08-29 LAB — RESP PANEL BY RT-PCR (FLU A&B, COVID) ARPGX2
Influenza A by PCR: NEGATIVE
Influenza B by PCR: NEGATIVE
SARS Coronavirus 2 by RT PCR: NEGATIVE

## 2020-08-29 LAB — TROPONIN I (HIGH SENSITIVITY)
Troponin I (High Sensitivity): 41 ng/L — ABNORMAL HIGH (ref <18)
Troponin I (High Sensitivity): 66 ng/L — ABNORMAL HIGH (ref <18)
Troponin I (High Sensitivity): 612 ng/L (ref <18)
Troponin I (High Sensitivity): 1917 ng/L (ref <18)

## 2020-08-29 LAB — SARS CORONAVIRUS 2 (TAT 6-24 HRS): SARS Coronavirus 2: NEGATIVE

## 2020-08-29 LAB — HIV ANTIBODY (ROUTINE TESTING W REFLEX): HIV Screen 4th Generation wRfx: NONREACTIVE

## 2020-08-29 LAB — BRAIN NATRIURETIC PEPTIDE: B Natriuretic Peptide: 87.6 pg/mL (ref 0.0–100.0)

## 2020-08-29 MED ORDER — ASPIRIN 81 MG PO CHEW
81.0000 mg | CHEWABLE_TABLET | ORAL | Status: AC
  Administered 2020-08-30: 81 mg via ORAL
  Filled 2020-08-29: qty 1

## 2020-08-29 MED ORDER — HEPARIN (PORCINE) 25000 UT/250ML-% IV SOLN
1600.0000 [IU]/h | INTRAVENOUS | Status: DC
  Administered 2020-08-29: 1150 [IU]/h via INTRAVENOUS
  Administered 2020-08-30 – 2020-08-31 (×2): 1700 [IU]/h via INTRAVENOUS
  Filled 2020-08-29 (×4): qty 250

## 2020-08-29 MED ORDER — MORPHINE SULFATE (PF) 4 MG/ML IV SOLN
4.0000 mg | Freq: Once | INTRAVENOUS | Status: AC
  Administered 2020-08-29: 4 mg via INTRAMUSCULAR
  Filled 2020-08-29: qty 1

## 2020-08-29 MED ORDER — IOHEXOL 350 MG/ML SOLN
75.0000 mL | Freq: Once | INTRAVENOUS | Status: AC | PRN
  Administered 2020-08-29: 75 mL via INTRAVENOUS

## 2020-08-29 MED ORDER — HEPARIN BOLUS VIA INFUSION
4000.0000 [IU] | Freq: Once | INTRAVENOUS | Status: AC
  Administered 2020-08-29: 4000 [IU] via INTRAVENOUS
  Filled 2020-08-29: qty 4000

## 2020-08-29 MED ORDER — SODIUM CHLORIDE 0.9% FLUSH
3.0000 mL | INTRAVENOUS | Status: DC | PRN
  Administered 2020-08-30 – 2020-08-31 (×2): 3 mL via INTRAVENOUS

## 2020-08-29 MED ORDER — SODIUM CHLORIDE 0.9 % IV SOLN: INTRAVENOUS | Status: DC

## 2020-08-29 MED ORDER — HYDROCODONE-ACETAMINOPHEN 7.5-325 MG PO TABS
1.0000 | ORAL_TABLET | Freq: Three times a day (TID) | ORAL | Status: DC | PRN
  Administered 2020-08-30 – 2020-09-03 (×8): 1 via ORAL
  Filled 2020-08-29 (×8): qty 1

## 2020-08-29 MED ORDER — PANTOPRAZOLE SODIUM 40 MG PO TBEC
40.0000 mg | DELAYED_RELEASE_TABLET | Freq: Every day | ORAL | Status: DC
  Administered 2020-08-29 – 2020-09-03 (×6): 40 mg via ORAL
  Filled 2020-08-29 (×6): qty 1

## 2020-08-29 MED ORDER — FUROSEMIDE 10 MG/ML IJ SOLN
40.0000 mg | Freq: Once | INTRAMUSCULAR | Status: AC
  Administered 2020-08-29: 40 mg via INTRAVENOUS
  Filled 2020-08-29: qty 4

## 2020-08-29 MED ORDER — SODIUM CHLORIDE 0.9% FLUSH
3.0000 mL | Freq: Two times a day (BID) | INTRAVENOUS | Status: DC
  Administered 2020-08-29 – 2020-09-02 (×7): 3 mL via INTRAVENOUS

## 2020-08-29 MED ORDER — SODIUM CHLORIDE 0.9 % IV SOLN: 250.0000 mL | INTRAVENOUS | Status: DC | PRN

## 2020-08-29 MED ORDER — INSULIN ASPART 100 UNIT/ML ~~LOC~~ SOLN
0.0000 [IU] | Freq: Three times a day (TID) | SUBCUTANEOUS | Status: DC
  Administered 2020-08-29: 8 [IU] via SUBCUTANEOUS
  Administered 2020-08-30: 5 [IU] via SUBCUTANEOUS
  Administered 2020-08-30 (×2): 8 [IU] via SUBCUTANEOUS
  Administered 2020-08-31: 3 [IU] via SUBCUTANEOUS
  Administered 2020-08-31: 5 [IU] via SUBCUTANEOUS
  Administered 2020-08-31 – 2020-09-01 (×2): 2 [IU] via SUBCUTANEOUS
  Administered 2020-09-01 (×2): 5 [IU] via SUBCUTANEOUS
  Administered 2020-09-02: 2 [IU] via SUBCUTANEOUS
  Administered 2020-09-02: 8 [IU] via SUBCUTANEOUS
  Administered 2020-09-02: 3 [IU] via SUBCUTANEOUS
  Administered 2020-09-03: 5 [IU] via SUBCUTANEOUS

## 2020-08-29 MED ORDER — ISOSORBIDE MONONITRATE ER 60 MG PO TB24
60.0000 mg | ORAL_TABLET | Freq: Every day | ORAL | Status: DC
  Administered 2020-08-29 – 2020-08-30 (×2): 60 mg via ORAL
  Filled 2020-08-29: qty 1
  Filled 2020-08-29: qty 2

## 2020-08-29 MED ORDER — SODIUM CHLORIDE 0.9% FLUSH
3.0000 mL | Freq: Two times a day (BID) | INTRAVENOUS | Status: DC
  Administered 2020-09-01: 3 mL via INTRAVENOUS

## 2020-08-29 MED ORDER — ACETAMINOPHEN 325 MG PO TABS: 650.0000 mg | ORAL_TABLET | ORAL | Status: DC | PRN

## 2020-08-29 MED ORDER — ONDANSETRON HCL 4 MG/2ML IJ SOLN
4.0000 mg | Freq: Four times a day (QID) | INTRAMUSCULAR | Status: DC | PRN
  Administered 2020-08-31 – 2020-09-01 (×3): 4 mg via INTRAVENOUS
  Filled 2020-08-29 (×3): qty 2

## 2020-08-29 MED ORDER — SODIUM CHLORIDE 0.9% FLUSH: 3.0000 mL | INTRAVENOUS | Status: DC | PRN

## 2020-08-29 MED ORDER — ONDANSETRON HCL 4 MG PO TABS
4.0000 mg | ORAL_TABLET | Freq: Once | ORAL | Status: AC
  Administered 2020-08-29: 4 mg via ORAL
  Filled 2020-08-29: qty 1

## 2020-08-29 MED ORDER — PERFLUTREN LIPID MICROSPHERE
1.0000 mL | INTRAVENOUS | Status: AC | PRN
  Administered 2020-08-29: 5 mL via INTRAVENOUS
  Filled 2020-08-29: qty 10

## 2020-08-29 MED ORDER — NITROGLYCERIN 0.4 MG SL SUBL
0.4000 mg | SUBLINGUAL_TABLET | SUBLINGUAL | Status: DC | PRN
  Administered 2020-08-29: 0.4 mg via SUBLINGUAL
  Filled 2020-08-29: qty 1

## 2020-08-29 MED ORDER — CARVEDILOL 12.5 MG PO TABS
12.5000 mg | ORAL_TABLET | Freq: Two times a day (BID) | ORAL | Status: DC
  Administered 2020-08-29 – 2020-09-03 (×10): 12.5 mg via ORAL
  Filled 2020-08-29 (×3): qty 1
  Filled 2020-08-29: qty 4
  Filled 2020-08-29 (×7): qty 1

## 2020-08-29 MED ORDER — EZETIMIBE 10 MG PO TABS
10.0000 mg | ORAL_TABLET | Freq: Every day | ORAL | Status: DC
  Administered 2020-08-29 – 2020-09-03 (×6): 10 mg via ORAL
  Filled 2020-08-29 (×7): qty 1

## 2020-08-29 MED ORDER — ASPIRIN EC 81 MG PO TBEC
81.0000 mg | DELAYED_RELEASE_TABLET | Freq: Every day | ORAL | Status: DC
  Administered 2020-08-29 – 2020-09-03 (×6): 81 mg via ORAL
  Filled 2020-08-29 (×7): qty 1

## 2020-08-29 NOTE — Progress Notes (Signed)
ANTICOAGULATION CONSULT NOTE - Follow Up Consult  Pharmacy Consult for IV heparin Indication: chest pain/ACS  Allergies  Allergen Reactions  . Naproxen Hives and Rash  . Celecoxib Other (See Comments)    Stomach pain  . Aspirin Other (See Comments)    CAN NOT TAKE DUE TO ULCERS  . Glucosamine Hives, Swelling and Other (See Comments)    Angioedema   . Metoclopramide Other (See Comments)    Facial twitching and stuttering  . Metolazone Other (See Comments)    Acute renal failure  . Shellfish-Derived Products Hives, Swelling and Other (See Comments)    Angioedema   . Tramadol Nausea And Vomiting         Patient Measurements:   Heparin Dosing Weight: ~ 80 kg  Vital Signs: Temp: 97.8 F (36.6 C) (02/14 0624) Temp Source: Oral (02/14 0624) BP: 146/44 (02/14 1400) Pulse Rate: 87 (02/14 1400)  Labs: Recent Labs    08/29/20 0636 08/29/20 0853 08/29/20 1148  HGB 9.6*  --   --   HCT 30.8*  --   --   PLT 320  --   --   CREATININE 1.87*  --   --   TROPONINIHS 41* 66* 612*    CrCl cannot be calculated (Unknown ideal weight.).   Medical History: Past Medical History:  Diagnosis Date  . Acquired absence of left great toe ([place])   . Acquired absence of other left toe(s) ([place])   . Anginal pain ([place])   . Atherosclerotic heart disease of native coronary artery without angina pectoris   . CAD (coronary artery disease)    08/2016 DES to RCA, also with history of LAD stenting.   . Chronic diastolic (congestive) heart failure ([place])   . Chronic kidney disease, stage 2 (mild)   . Chronic systolic (congestive) heart failure ([place])   . Diabetes ([place])   . Diastolic CHF ([place])   . Gastro-esophageal reflux disease without esophagitis   . Hyperlipidemia   . Hypertension   . Hypertensive heart disease with heart failure ([place])   . Hypoxemia   . Idiopathic gout, multiple sites   . Localized edema   . Low back pain   . Mixed hyperlipidemia   . Mixed incontinence   . Morbid  (severe) obesity due to excess calories ([place])   . OSA (obstructive sleep apnea)   . Other specified hypothyroidism   . Primary insomnia   . Type 2 diabetes mellitus with diabetic nephropathy (HCC)     Medications:  Infusions:  . sodium chloride    . heparin 1,150 Units/hr (08/29/20 1403)    Assessment: 60 yo female admitted with chest pain. Pt was started on IV heparin without bolus but now with significant elevation in troponin.   SCr 1.87, H/H low, Plt wnl. No overt s/s of bleeding   Goal of Therapy:  Heparin level 0.3-0.7 units/ml Monitor platelets by anticoagulation protocol: Yes   Plan:  Bolus IV heparin 4000 units once  Continue IV heparin at rate of 1150 units/hr Check heparin level in 8 hrs Daily heparin level and CBC.  Albertina Parr, PharmD., BCPS, BCCCP Clinical Pharmacist Please refer to [facility] for unit-specific pharmacist

## 2020-08-29 NOTE — Progress Notes (Signed)
ANTICOAGULATION CONSULT NOTE - Initial Consult  Pharmacy Consult for IV heparin Indication: chest pain/ACS  Allergies  Allergen Reactions  . Naproxen Hives and Rash  . Celecoxib Other (See Comments)    Stomach pain  . Aspirin Other (See Comments)    CAN NOT TAKE DUE TO ULCERS  . Glucosamine Hives, Swelling and Other (See Comments)    Angioedema   . Metoclopramide Other (See Comments)    Facial twitching and stuttering  . Metolazone Other (See Comments)    Acute renal failure  . Shellfish-Derived Products Hives, Swelling and Other (See Comments)    Angioedema   . Tramadol Nausea And Vomiting         Patient Measurements:   Heparin Dosing Weight: ~ 80 kg  Vital Signs: Temp: 97.8 F (36.6 C) (02/14 0624) Temp Source: Oral (02/14 0624) BP: 157/85 (02/14 1245) Pulse Rate: 85 (02/14 1245)  Labs: Recent Labs    08/29/20 0636 08/29/20 0853  HGB 9.6*  --   HCT 30.8*  --   PLT 320  --   CREATININE 1.87*  --   TROPONINIHS 41* 66*    CrCl cannot be calculated (Unknown ideal weight.).   Medical History: Past Medical History:  Diagnosis Date  . Acquired absence of left great toe ([place])   . Acquired absence of other left toe(s) ([place])   . Anginal pain ([place])   . Atherosclerotic heart disease of native coronary artery without angina pectoris   . CAD (coronary artery disease)    08/2016 DES to RCA, also with history of LAD stenting.   . Chronic diastolic (congestive) heart failure ([place])   . Chronic kidney disease, stage 2 (mild)   . Chronic systolic (congestive) heart failure ([place])   . Diabetes ([place])   . Diastolic CHF ([place])   . Gastro-esophageal reflux disease without esophagitis   . Hyperlipidemia   . Hypertension   . Hypertensive heart disease with heart failure ([place])   . Hypoxemia   . Idiopathic gout, multiple sites   . Localized edema   . Low back pain   . Mixed hyperlipidemia   . Mixed incontinence   . Morbid (severe) obesity due to excess calories ([place])    . OSA (obstructive sleep apnea)   . Other specified hypothyroidism   . Primary insomnia   . Type 2 diabetes mellitus with diabetic nephropathy (HCC)     Medications:  Infusions:  . sodium chloride    . heparin      Assessment: 60 yo female admitted with chest pain.  Pharmacy asked to begin IV heparin for r/o ACS.  Goal of Therapy:  Heparin level 0.3-0.7 units/ml Monitor platelets by anticoagulation protocol: Yes   Plan:  Start IV heparin at rate of 1150 units/hr Check heparin level in 8 hrs Daily heparin level and CBC.  [place] Crane, Roylene Reason, BCCP Clinical Pharmacist  08/29/2020 1:22 PM   [facility] pharmacy phone numbers are listed on [email or web address]

## 2020-08-29 NOTE — Progress Notes (Signed)
  Echocardiogram 2D Echocardiogram has been performed with [facility].  Dana [name] 08/29/2020, 3:32 PM

## 2020-08-29 NOTE — Plan of Care (Signed)
?  Problem: Activity: ?Goal: Capacity to carry out activities will improve ?Outcome: Progressing ?  ?

## 2020-08-29 NOTE — ED Notes (Signed)
Provider messaged re: Good morning. The NTG did not help w her pain. She took 3 this morning before coming and I gave another here. She would love something for her pain if possible. Thank you.

## 2020-08-29 NOTE — Progress Notes (Signed)
  RHC/LHC set up for tomorrow at [facility] NP-[facility]  4:56 PM

## 2020-08-29 NOTE — Progress Notes (Signed)
Spoke with RN who stated she was able to establish PIV access. No longer needs IV team

## 2020-08-29 NOTE — ED Triage Notes (Signed)
Pt here from home with c/o chest pain , along with some sob started around 430 , no nausea , pt has hx of 4 stetnts

## 2020-08-29 NOTE — H&P (Addendum)
Advanced Heart Failure Team H&P   Primary Physician: [name], MD PCP-Cardiologist:  Dr [name]   Reason for Consultation: Chest Pain/Heart Failure   HPI:    Dana [name] is seen today for evaluation of chest pain/heart failure at the request of Dr Ashok Cordia.   Dana [name] is 60 year old with history of diastolic heart failure, CAD, DES RCA/LAD 2018, DMI, htn, hyperlipidemia, PAD, and OSA.   All cardiomems readings have been with in range. Goal 10. Last reading on 08/26/20 was 11.   Yesterday she had wings and mac n cheese. Says this is the first time she had food like that in a while. Around 0430 she had sharp chest pain 10/10 and took a total 4 SL NTG. On arrival to ED pain 7/10 oxygen saturations < 88. Given SL NTG + morphine. Had CTA chest that was negative for PE and suggestive of mild CHF. Says when she had CTA she had chest heaviness and some shortness of breath.   HS Trop 41>66  Feeling better.   Echo 07/2020 EF 60-65%.   Review of Systems: [y] = yes, _0  = no   . General: Weight gain _1 ; Weight loss _2 ; Anorexia _3 ; Fatigue _4 ; Fever _5 ; Chills _6 ; Weakness _7   . Cardiac: Chest pain/pressure [Y ]; Resting SOB [ Y]; Exertional SOB _8 ; Orthopnea _9 ; Pedal Edema _10 ; Palpitations _11 ; Syncope _12 ; Presyncope _13 ; Paroxysmal nocturnal dyspnea_14   . Pulmonary: Cough _15 ; Wheezing_16 ; Hemoptysis_17 ; Sputum _18 ; Snoring _19   . GI: Vomiting_20 ; Dysphagia_21 ; Melena_22 ; Hematochezia _23 ; Heartburn_24 ; Abdominal pain _25 ; Constipation _26 ; Diarrhea _27 ; BRBPR _28   . GU: Hematuria_29 ; Dysuria _30 ; Nocturia_31   . Vascular: Pain in legs with walking _32 ; Pain in feet with lying flat _33 ; Non-healing sores _34 ; Stroke _35 ; TIA _36 ; Slurred speech _37 ;  . Neuro: Headaches_38 ; Vertigo_39 ; Seizures_40 ; Paresthesias_41 ;Blurred vision _42 ; Diplopia _43 ; Vision changes _44   . Ortho/Skin: Arthritis _45 ; Joint pain [Y ]; Muscle pain _46 ; Joint swelling _47 ; Back Pain [ Y]; Rash _48    . Psych: Depression[Y ]; Anxiety_49   . Heme: Bleeding problems _50 ; Clotting disorders _51 ; Anemia _52   . Endocrine: Diabetes [Y ]; Thyroid dysfunction_53   Home Medications Prior to Admission medications   Medication Sig Start Date End Date Taking? Authorizing Provider  allopurinol (ZYLOPRIM) 100 MG tablet TAKE 2 TABLETS BY MOUTH  DAILY 06/29/20   Bensimhon, Shaune Pascal, MD  aspirin 81 MG chewable tablet Chew 81 mg by mouth daily.    [provider]  carvedilol (COREG) 12.5 MG tablet Take 1 tablet (12.5 mg total) by mouth 2 (two) times daily. 07/25/20   Bensimhon, Shaune Pascal, MD  clopidogrel (PLAVIX) 75 MG tablet TAKE 1 TABLET BY MOUTH AT  BEDTIME 06/29/20   Bensimhon, Shaune Pascal, MD  dapagliflozin propanediol (FARXIGA) 10 MG TABS tablet Take 1 tablet (10 mg total) by mouth daily before breakfast. 12/08/19   Bensimhon, Shaune Pascal, MD  EPINEPHrine 0.3 mg/0.3 mL IJ SOAJ injection Use as directed for life-threatening allergic reaction. 06/24/18   Kennith Gain, MD  Evolocumab (REPATHA SURECLICK) 604 MG/ML SOAJ Inject 1 Dose into the skin every 14 (fourteen) days. 09/18/19   Hilty, Nadean Corwin, MD  ezetimibe (ZETIA)  10 MG tablet TAKE 1 TABLET BY MOUTH  DAILY 06/29/20   Hilty, Nadean Corwin, MD  famotidine (PEPCID) 20 MG tablet Take 1 tablet (20 mg total) by mouth daily. 06/24/18   Kennith Gain, MD  ferrous sulfate 325 (65 FE) MG tablet Take 325 mg by mouth 2 (two) times daily with a meal.    [provider]  hydrALAZINE (APRESOLINE) 100 MG tablet Take 1 tablet (100 mg total) by mouth 3 (three) times daily. 12/08/19   Bensimhon, Shaune Pascal, MD  HYDROcodone-acetaminophen (NORCO) 7.5-325 MG tablet Take 1 tablet by mouth 3 (three) times daily as needed for moderate pain. 08/22/20   Cox, Kirsten, MD  insulin NPH Human (HUMULIN N,NOVOLIN N) 100 UNIT/ML injection Inject 20 Units into the skin 2 (two) times daily before a meal.     [provider]  insulin regular (NOVOLIN  R,HUMULIN R) 100 units/mL injection Inject 6-12 Units into the skin 3 (three) times daily before meals. Per sliding scale    [provider]  isosorbide mononitrate (IMDUR) 60 MG 24 hr tablet Take 1 tablet (60 mg total) by mouth daily. 06/28/20   Cox, Elnita Maxwell, MD  latanoprost (XALATAN) 0.005 % ophthalmic solution Place 1 drop into both eyes at bedtime.    [provider]  LORazepam (ATIVAN) 0.5 MG tablet Take 1 tablet (0.5 mg total) by mouth daily as needed for anxiety. 08/22/20   CoxElnita Maxwell, MD  magnesium oxide (MAG-OX) 400 MG tablet Take 2 tablets (800 mg total) by mouth daily. Take 2 (400 mg) daily=800 mg 04/09/18   Shirley Friar, PA-C  nitroGLYCERIN (NITROSTAT) 0.4 MG SL tablet Place 1 tablet (0.4 mg total) under the tongue every 5 (five) minutes as needed for chest pain. 09/07/19   Clegg, Amy D, NP  Omega-3 Fatty Acids (FISH OIL PO) Take 1 capsule by mouth daily.    [provider]  OXYGEN Inhale 2 L into the lungs at bedtime.    [provider]  Probiotic CAPS Take 1 capsule by mouth daily.    [provider]  promethazine (PHENERGAN) 25 MG tablet Take 1 tablet (25 mg total) by mouth every 6 (six) hours as needed for nausea or vomiting. 08/05/20   Cox, Elnita Maxwell, MD  spironolactone (ALDACTONE) 25 MG tablet TAKE 1 TABLET BY MOUTH  DAILY 05/02/20   Darrick Grinder D, NP    Past Medical History: Past Medical History:  Diagnosis Date  . Acquired absence of left great toe ([place])   . Acquired absence of other left toe(s) ([place])   . Anginal pain ([place])   . Atherosclerotic heart disease of native coronary artery without angina pectoris   . CAD (coronary artery disease)    08/2016 DES to RCA, also with history of LAD stenting.   . Chronic diastolic (congestive) heart failure ([place])   . Chronic kidney disease, stage 2 (mild)   . Chronic systolic (congestive) heart failure ([place])   . Diabetes ([place])   . Diastolic CHF ([place])   . Gastro-esophageal reflux  disease without esophagitis   . Hyperlipidemia   . Hypertension   . Hypertensive heart disease with heart failure ([place])   . Hypoxemia   . Idiopathic gout, multiple sites   . Localized edema   . Low back pain   . Mixed hyperlipidemia   . Mixed incontinence   . Morbid (severe) obesity due to excess calories ([place])   . OSA (obstructive sleep apnea)   . Other specified hypothyroidism   .  Primary insomnia   . Type 2 diabetes mellitus with diabetic nephropathy [facility])     Past Surgical History: Past Surgical History:  Procedure Laterality Date  . ABDOMINAL HYSTERECTOMY     Partial- Still has both ovaries  . CARDIOVASCULAR STRESS TEST  08/13/2014   Nuclear; Normal  . CARPAL TUNNEL RELEASE    . CATARACT EXTRACTION, BILATERAL    . CESAREAN SECTION    . CHOLECYSTECTOMY    . CORONARY ANGIOPLASTY WITH STENT PLACEMENT     3 blockages/ 1 stent  . EYE SURGERY    . RIGHT/LEFT HEART CATH AND CORONARY ANGIOGRAPHY N/A 01/07/2017   Procedure: Right/Left Heart Cath and Coronary Angiography;  Surgeon: Larey Dresser, MD;  Location: [facility];  Service: Cardiovascular;  Laterality: N/A;  . ROTATOR CUFF REPAIR    . TOE AMPUTATION Left   . US ECHOCARDIOGRAPHY  05/2017   Normal    Family History: Family History  Problem Relation Age of Onset  . Hypertension Father   . Coronary artery disease Mother   . Hypertension Mother   . Hyperlipidemia Mother   . Diabetes type II Mother   . Breast cancer Mother   . Lung cancer Mother   . Bipolar disorder Other   . Depression Other   . Schizophrenia Other     Social History: Social History   Socioeconomic History  . Marital status: Married    Spouse name: Not on file  . Number of children: Not on file  . Years of education: Not on file  . Highest education level: Not on file  Occupational History  . Not on file  Tobacco Use  . Smoking status: Former Research scientist (life sciences)  . Smokeless tobacco: Never Used  Vaping Use  . Vaping Use: Never used   Substance and Sexual Activity  . Alcohol use: Not Currently  . Drug use: Not Currently  . Sexual activity: Not on file  Other Topics Concern  . Not on file  Social History Narrative  . Not on file   Social Determinants of Health   Financial Resource Strain: Not on file  Food Insecurity: No Food Insecurity  . Worried About Charity fundraiser in the Last Year: Never true  . Ran Out of Food in the Last Year: Never true  Transportation Needs: No Transportation Needs  . Lack of Transportation (Medical): No  . Lack of Transportation (Non-Medical): No  Physical Activity: Insufficiently Active  . Days of Exercise per Week: 3 days  . Minutes of Exercise per Session: 40 min  Stress: Not on file  Social Connections: Not on file    Allergies:  Allergies  Allergen Reactions  . Naproxen Hives and Rash  . Celecoxib Other (See Comments)    Stomach pain  . Aspirin Other (See Comments)    CAN NOT TAKE DUE TO ULCERS  . Glucosamine Hives, Swelling and Other (See Comments)    Angioedema   . Metoclopramide Other (See Comments)    Facial twitching and stuttering  . Metolazone Other (See Comments)    Acute renal failure  . Shellfish-Derived Products Hives, Swelling and Other (See Comments)    Angioedema   . Tramadol Nausea And Vomiting         Objective:    Vital Signs:   Temp:  [97.8 F (36.6 C)] 97.8 F (36.6 C) (02/14 0624) Pulse Rate:  [81-102] 81 (02/14 1100) Resp:  [14-33] 14 (02/14 1100) BP: (133-190)/(48-92) 139/51 (02/14 1100) SpO2:  [88 %-99 %]  99 % (02/14 1100)    Weight change: There were no vitals filed for this visit.  Intake/Output:   Intake/Output Summary (Last 24 hours) at 08/29/2020 1141 Last data filed at 08/29/2020 1014 Gross per 24 hour  Intake -  Output 500 ml  Net -500 ml      Physical Exam    General: No resp difficulty HEENT: normal Neck: supple. JVP 10-11  . Carotids 2+ bilat; no bruits. No lymphadenopathy or thyromegaly  appreciated. Cor: PMI nondisplaced. Regular rate & rhythm. No rubs, gallops or murmurs. Lungs: clear Abdomen: soft, nontender, nondistended. No hepatosplenomegaly. No bruits or masses. Good bowel sounds. Extremities: no cyanosis, clubbing, rash, compression stockings 1+ edema Neuro: alert & orientedx3, cranial nerves grossly intact. moves all 4 extremities w/o difficulty. Affect pleasant   Telemetry   SR 90s   EKG    SR 80 bpm   Labs   Basic Metabolic Panel: Recent Labs  Lab 08/29/20 0636  NA 138  K 4.1  CL 106  CO2 22  GLUCOSE 226*  BUN 34*  CREATININE 1.87*  CALCIUM 8.6*    Liver Function Tests: No results for input(s): AST, ALT, ALKPHOS, BILITOT, PROT, ALBUMIN in the last 168 hours. No results for input(s): LIPASE, AMYLASE in the last 168 hours. No results for input(s): AMMONIA in the last 168 hours.  CBC: Recent Labs  Lab 08/29/20 0636  WBC 12.1*  HGB 9.6*  HCT 30.8*  MCV 84.6  PLT 320    Cardiac Enzymes: No results for input(s): CKTOTAL, CKMB, CKMBINDEX, TROPONINI in the last 168 hours.  BNP: BNP (last 3 results) Recent Labs    08/29/20 0636  BNP 87.6    ProBNP (last 3 results) No results for input(s): PROBNP in the last 8760 hours.   CBG: No results for input(s): GLUCAP in the last 168 hours.  Coagulation Studies: No results for input(s): LABPROT, INR in the last 72 hours.   Imaging   CT Angio Chest PE W and/or Wo Contrast  Result Date: 08/29/2020 CLINICAL DATA:  Chest pain, shortness of breath EXAM: CT ANGIOGRAPHY CHEST WITH CONTRAST TECHNIQUE: Multidetector CT imaging of the chest was performed using the standard protocol during bolus administration of intravenous contrast. Multiplanar CT image reconstructions and MIPs were obtained to evaluate the vascular anatomy. CONTRAST:  65 mL Omnipaque 350 IV COMPARISON:  02/11/2018 FINDINGS: Cardiovascular: Metallic device noted within the central left pulmonary artery compatible with pulmonary  arterial pressure monitor, new since prior study. No filling defects seen to suggest pulmonary emboli. Heart is enlarged. Diffuse coronary artery calcifications. Aorta normal caliber. Mild cardiomegaly. Mediastinum/Nodes: Mildly enlarged mediastinal lymph nodes, progressed since prior study. Index right paratracheal node has a short axis diameter of 13 mm. Mildly prominent bilateral hilar lymph nodes. No axillary adenopathy. Lungs/Pleura: Diffuse ground-glass opacities throughout the lungs. Vascular congestion. Small bilateral pleural effusions. Findings most suggestive of CHF. Upper Abdomen: Imaging into the upper abdomen demonstrates no acute findings. Musculoskeletal: Chest wall soft tissues are unremarkable. No acute bony abnormality. Review of the MIP images confirms the above findings. IMPRESSION: No evidence of pulmonary embolus. Cardiomegaly with vascular congestion. Ground-glass opacities and small effusions. Findings most suggestive of mild CHF. Coronary artery disease. Pulmonary arterial pressure monitor within the left pulmonary artery. Electronically Signed   By: Rolm Baptise M.D.   On: 08/29/2020 09:56   DG Chest Port 1 View  Result Date: 08/29/2020 CLINICAL DATA:  Shortness of breath. EXAM: PORTABLE CHEST 1 VIEW COMPARISON:  Chest x-ray  02/11/2018, CT chest 02/11/2018 FINDINGS: The heart size and mediastinal contours are unchanged persistent cardiomegaly. Hilar vasculature prominence. CardioMEMS device overlying the left hilar region. Interval development of patchy airspace opacities that are more prominent within the mid to lower lung zone. Slightly increased interstitial markings. Possible trace bilateral pleural effusions. No significant pleural effusion bilaterally. No pneumothorax. No acute osseous abnormality. IMPRESSION: Vascular congestion with likely superimposed infection/inflammation. COVID-19 infection not excluded. Electronically Signed   By: Iven Finn M.D.   On: 08/29/2020  06:55      Medications:     Current Medications: . furosemide  40 mg Intravenous Once     Infusions:       Assessment/Plan  1. Chest Pain -Has h/o CAD--> DES to RCA and LAD in 2018 total of 4 stents - Prior to admit on plavix/bb,repatha.  -HS 44>60 > check another HS Trop now.   -? Demand ischemia with mild volume overload.  -Add heparin drip. Will check HS Trop now. If no trend can stop.  - EKG: no ST changes.   - Start back home coreg and imdur dose.   2. A/C Diastolic HF ECHO 0/6237 EF 60-65%. BNP 88 - Mild volume overload. Suspect high sodium food over the last 24 hours may exacerbated symptoms. Sounds like orthopnea from volume overload.  - Cardiomems: Last reading 2/11-->11. No reading since that time. Not elevated reading in earlier this month - She has not been on scheduled diuretics since she started farxiga 11/2019. She has not taken any lasix since that time.  - Give 40 mg IV lasix now and assess response.  - Hold spiro .   3. CKD Stage IIIb -Creatine baseline 1.5-1.7  -Creatinine 1.8 today  4. Anemia  hgb 9.6  No obvious source.  5. HTN  -Elevated - Start home coreg/imdur   Admit for observation. Diurese with IV lasix. HS Trop now. If no trend will stop heparin drip.    Length of Stay: 0  Darrick Grinder, NP  08/29/2020, 11:41 AM  Advanced Heart Failure Team Pager [phone] (M-F; 7a - 4p)  Please contact [facility] for night-coverage after hours (4p -7a ) and weekends on [email or web address]  Patient seen with NP, agree with the above note.   60 y.o. with h/o CAD and diastolic CHF with Cardiomems presents with dyspnea and chest pain.  She was doing well by Cardiomems recently, last transmission on 2/11 was at baseline.  Last night, ate mac and cheese and chicken wings while watching Superbowl.  Woke up 4:30 this morning with chest pressure and shortness of breath.  The chest pressure was constant for about 5-6 hours, resolved eventually with morphine in ER.   In the ER, oxygen saturation noted to be down in the 80s.  She had prominent orthopnea, currently sitting up.  Hard to lie down for CT.  CTA chest no PE but +vascular congestion.  Vascular congestion on CXR. Creatinine is mildly higher than baseline at 1.8. ECG normal.   General: NAD Neck: Thick, JVP 10-11 cm, no thyromegaly or thyroid nodule.  Lungs: Mild crackles at bases.  CV: Nondisplaced PMI.  Heart regular S1/S2, no S3/S4, no murmur.  No peripheral edema.  No carotid bruit.  Normal pedal pulses.  Abdomen: Soft, nontender, no hepatosplenomegaly, no distention.  Skin: Intact without lesions or rashes.  Neurologic: Alert and oriented x 3.  Psych: Normal affect. Extremities: No clubbing or cyanosis.  HEENT: Normal.   1. Acute on chronic diastolic CHF: Suspect in setting  of dietary indiscretion/salt load.  Prominent orthopnea and oxygen saturation in 80s in room air especially with attempting to ambulate.  Vascular congestion on chest imaging.  Interestingly, BNP not significantly elevated. Does not look like she takes Lasix regularly.  - Lasix 40 mg IV bid and follow response.  - Continue spironolactone and dapagliflozin.  2. CAD: H/o CAD s/p PCI.  Mildly elevated HS-TnI 41 => 66.  No ECG changes.  Had prolonged chest pressure in setting of dyspnea and orthopnea.  Think presentation more consistent with CHF exacerbation with mild TnI elevation from demand ischemia/volume overload.   - Repeat TnI again to make sure no trend.  - Heparin gtt for now, can stop if TnI flat.  - Continue ASA, Plavix.  Not on statin given Repatha use. - Echo to make sure EF remains normal.  3. CKD stage 3: Creatinine 1.8, mildly above baseline.  Follow with diuresis.   Loralie Champagne 08/29/2020 1:06 PM

## 2020-08-29 NOTE — ED Provider Notes (Signed)
[facility] EMERGENCY DEPARTMENT Provider Note   CSN: [id number] Arrival date & time: 08/29/20  3151     History Chief Complaint  Patient presents with  . Chest Pain    Dana [name] is a 60 y.o. female past medical history significant for anginal pain, CAD s/p stents x 4 on on plavix, chronic systolic and diastolic heart failure, CKD stage II, type 2 diabetes, hypertension, idiopathic gout, morbid obesity.  Cardiologist is Dr. [name]. Had covid vaccinations. Echo with LV EF 60-65% 11/2019.  HPI Patient presents to emergency department today with chief complaint of chest pain x2 hours.  She states the pain woke her up from her sleep. Pain is located on the left side of her chest and radiates to the middle.  It will occasionally radiate to her left arm.  She describes the pain as a pressure and heaviness sensation.  She has associated diaphoresis and nausea.  She tried taking 3 nitro without much symptom improvement.  She rates the pain currently 7 out of 10 in severity.  Patient does admit to wearing 2 L nasal cannula at night only. She states pain feels similar to heart attacks in the past. She admits to healthy diet until last night when she had macaroni and cheese and chicken wings. Denies weight gain, lower extremity edema, dyspnea on exertion, fever, chills, syncope, abdominal pain, urinary symptoms, diarrhea, blood in stool. She is complaint with medications. Not currently prescribed fluid pill.  Family history of cardiac disease with mother having CAD.  Past Medical History:  Diagnosis Date  . Acquired absence of left great toe ([place])   . Acquired absence of other left toe(s) ([place])   . Anginal pain ([place])   . Atherosclerotic heart disease of native coronary artery without angina pectoris   . CAD (coronary artery disease)    08/2016 DES to RCA, also with history of LAD stenting.   . Chronic diastolic (congestive) heart failure ([place])   . Chronic kidney disease,  stage 2 (mild)   . Chronic systolic (congestive) heart failure ([place])   . Diabetes ([place])   . Diastolic CHF ([place])   . Gastro-esophageal reflux disease without esophagitis   . Hyperlipidemia   . Hypertension   . Hypertensive heart disease with heart failure ([place])   . Hypoxemia   . Idiopathic gout, multiple sites   . Localized edema   . Low back pain   . Mixed hyperlipidemia   . Mixed incontinence   . Morbid (severe) obesity due to excess calories ([place])   . OSA (obstructive sleep apnea)   . Other specified hypothyroidism   . Primary insomnia   . Type 2 diabetes mellitus with diabetic nephropathy [facility])     Patient Active Problem List   Diagnosis Date Noted  . Acute on chronic diastolic heart failure ([place]) 08/29/2020  . Need for COVID-19 vaccine 06/28/2020  . Medicare annual wellness visit, subsequent 06/13/2020  . Chronic respiratory failure with hypoxia ([place]) 03/23/2020  . Chronic gout without tophus 03/23/2020  . Pain in both hands 03/23/2020  . Uncomplicated opioid dependence ([place]) 03/23/2020  . Depression, major, recurrent, mild ([place]) 12/21/2019  . Hypomagnesemia 12/21/2019  . Diarrhea 12/21/2019  . Lumbar back pain 10/05/2019  . Diabetic nephropathy associated with type 2 diabetes mellitus ([place]) 10/05/2019  . Hypertensive heart and renal disease with congestive heart failure ([place]) 09/18/2019  . Demand ischemia ([place])   . Diastolic heart failure ([place]) 11/07/2017  . Morbid obesity with BMI of  45.0-49.9, adult ([place]) 11/07/2017  . Coronary artery disease involving native coronary artery of native heart without angina pectoris 12/25/2016  . Mixed hyperlipidemia 12/14/2016  . Anemia 03/23/2016  . Chronic pain syndrome 03/23/2016  . COPD (chronic obstructive pulmonary disease) ([place]) 03/23/2016  . GERD without esophagitis 03/23/2016  . On home oxygen therapy 03/23/2016  . S/P ablation of atrial fibrillation 10/04/2015  . Diabetic polyneuropathy associated with type 2 diabetes  mellitus ([place]) 04/02/2014    Past Surgical History:  Procedure Laterality Date  . ABDOMINAL HYSTERECTOMY     Partial- Still has both ovaries  . CARDIOVASCULAR STRESS TEST  08/13/2014   Nuclear; Normal  . CARPAL TUNNEL RELEASE    . CATARACT EXTRACTION, BILATERAL    . CESAREAN SECTION    . CHOLECYSTECTOMY    . CORONARY ANGIOPLASTY WITH STENT PLACEMENT     3 blockages/ 1 stent  . EYE SURGERY    . RIGHT/LEFT HEART CATH AND CORONARY ANGIOGRAPHY N/A 01/07/2017   Procedure: Right/Left Heart Cath and Coronary Angiography;  Surgeon: Larey Dresser, MD;  Location: [facility] CV LAB;  Service: Cardiovascular;  Laterality: N/A;  . ROTATOR CUFF REPAIR    . TOE AMPUTATION Left   . US ECHOCARDIOGRAPHY  05/2017   Normal     OB History   No obstetric history on file.     Family History  Problem Relation Age of Onset  . Hypertension Father   . Coronary artery disease Mother   . Hypertension Mother   . Hyperlipidemia Mother   . Diabetes type II Mother   . Breast cancer Mother   . Lung cancer Mother   . Bipolar disorder Other   . Depression Other   . Schizophrenia Other     Social History   Tobacco Use  . Smoking status: Former Research scientist (life sciences)  . Smokeless tobacco: Never Used  Vaping Use  . Vaping Use: Never used  Substance Use Topics  . Alcohol use: Not Currently  . Drug use: Not Currently    Home Medications Prior to Admission medications   Medication Sig Start Date End Date Taking? Authorizing Provider  allopurinol (ZYLOPRIM) 100 MG tablet TAKE 2 TABLETS BY MOUTH  DAILY 06/29/20   Bensimhon, Shaune Pascal, MD  aspirin 81 MG chewable tablet Chew 81 mg by mouth daily.    [provider]  carvedilol (COREG) 12.5 MG tablet Take 1 tablet (12.5 mg total) by mouth 2 (two) times daily. 07/25/20   Bensimhon, Shaune Pascal, MD  clopidogrel (PLAVIX) 75 MG tablet TAKE 1 TABLET BY MOUTH AT  BEDTIME 06/29/20   Bensimhon, Shaune Pascal, MD  dapagliflozin propanediol (FARXIGA) 10 MG TABS tablet Take 1  tablet (10 mg total) by mouth daily before breakfast. 12/08/19   Bensimhon, Shaune Pascal, MD  EPINEPHrine 0.3 mg/0.3 mL IJ SOAJ injection Use as directed for life-threatening allergic reaction. 06/24/18   Kennith Gain, MD  Evolocumab (REPATHA SURECLICK) 237 MG/ML SOAJ Inject 1 Dose into the skin every 14 (fourteen) days. 09/18/19   Hilty, Nadean Corwin, MD  ezetimibe (ZETIA) 10 MG tablet TAKE 1 TABLET BY MOUTH  DAILY 06/29/20   Hilty, Nadean Corwin, MD  famotidine (PEPCID) 20 MG tablet Take 1 tablet (20 mg total) by mouth daily. 06/24/18   Kennith Gain, MD  ferrous sulfate 325 (65 FE) MG tablet Take 325 mg by mouth 2 (two) times daily with a meal.    [provider]  hydrALAZINE (APRESOLINE) 100 MG tablet Take 1 tablet (100  mg total) by mouth 3 (three) times daily. 12/08/19   Bensimhon, Shaune Pascal, MD  HYDROcodone-acetaminophen (NORCO) 7.5-325 MG tablet Take 1 tablet by mouth 3 (three) times daily as needed for moderate pain. 08/22/20   Cox, Kirsten, MD  insulin NPH Human (HUMULIN N,NOVOLIN N) 100 UNIT/ML injection Inject 20 Units into the skin 2 (two) times daily before a meal.     [provider]  insulin regular (NOVOLIN R,HUMULIN R) 100 units/mL injection Inject 6-12 Units into the skin 3 (three) times daily before meals. Per sliding scale    [provider]  isosorbide mononitrate (IMDUR) 60 MG 24 hr tablet Take 1 tablet (60 mg total) by mouth daily. 06/28/20   Cox, Elnita Maxwell, MD  latanoprost (XALATAN) 0.005 % ophthalmic solution Place 1 drop into both eyes at bedtime.    [provider]  LORazepam (ATIVAN) 0.5 MG tablet Take 1 tablet (0.5 mg total) by mouth daily as needed for anxiety. 08/22/20   CoxElnita Maxwell, MD  magnesium oxide (MAG-OX) 400 MG tablet Take 2 tablets (800 mg total) by mouth daily. Take 2 (400 mg) daily=800 mg 04/09/18   Shirley Friar, PA-C  nitroGLYCERIN (NITROSTAT) 0.4 MG SL tablet Place 1 tablet (0.4 mg total) under the tongue  every 5 (five) minutes as needed for chest pain. 09/07/19   Clegg, Amy D, NP  Omega-3 Fatty Acids (FISH OIL PO) Take 1 capsule by mouth daily.    [provider]  OXYGEN Inhale 2 L into the lungs at bedtime.    [provider]  Probiotic CAPS Take 1 capsule by mouth daily.    [provider]  promethazine (PHENERGAN) 25 MG tablet Take 1 tablet (25 mg total) by mouth every 6 (six) hours as needed for nausea or vomiting. 08/05/20   Cox, Kirsten, MD  spironolactone (ALDACTONE) 25 MG tablet TAKE 1 TABLET BY MOUTH  DAILY 05/02/20   Clegg, Amy D, NP    Allergies    Naproxen, Celecoxib, Aspirin, Glucosamine, Metoclopramide, Metolazone, Shellfish-derived products, and Tramadol  Review of Systems   Review of Systems All other systems are reviewed and are negative for acute change except as noted in the HPI.  Physical Exam Updated Vital Signs BP (!) 182/92 (BP Location: Right Arm)   Pulse 88   Temp 97.8 F (36.6 C) (Oral)   Resp 17   SpO2 (!) 88%   Physical Exam Vitals and nursing note reviewed.  Constitutional:      General: She is not in acute distress.    Appearance: She is obese. She is ill-appearing.  HENT:     Head: Normocephalic and atraumatic.     Right Ear: Tympanic membrane and external ear normal.     Left Ear: Tympanic membrane and external ear normal.     Nose: Nose normal.     Mouth/Throat:     Mouth: Mucous membranes are moist.     Pharynx: Oropharynx is clear.  Eyes:     General: No scleral icterus.       Right eye: No discharge.        Left eye: No discharge.     Extraocular Movements: Extraocular movements intact.     Conjunctiva/sclera: Conjunctivae normal.     Pupils: Pupils are equal, round, and reactive to light.  Neck:     Vascular: No JVD.  Cardiovascular:     Rate and Rhythm: Normal rate and regular rhythm.     Pulses: Normal pulses.  Radial pulses are 2+ on the right side and 2+ on the left side.     Heart sounds:  Normal heart sounds.  Pulmonary:     Comments: Lungs clear to auscultation in all fields. Symmetric chest rise. No wheezing, rales, or rhonchi. Oxygen saturation 88% during exam, placed on 2 L [place] Abdominal:     Comments: Abdomen is soft, non-distended, and non-tender in all quadrants. No rigidity, no guarding. No peritoneal signs.  Musculoskeletal:        General: Normal range of motion.     Cervical back: Normal range of motion.     Right lower leg: Edema present.     Left lower leg: Edema present.  Skin:    General: Skin is warm and dry.     Capillary Refill: Capillary refill takes less than 2 seconds.  Neurological:     Mental Status: She is oriented to person, place, and time.     GCS: GCS eye subscore is 4. GCS verbal subscore is 5. GCS motor subscore is 6.     Comments: Fluent speech, no facial droop.  Psychiatric:        Behavior: Behavior normal.     ED Results / Procedures / Treatments   Labs (all labs ordered are listed, but only abnormal results are displayed) Labs Reviewed  BASIC METABOLIC PANEL - Abnormal; Notable for the following components:      Result Value   Glucose, Bld 226 (*)    BUN 34 (*)    Creatinine, Ser 1.87 (*)    Calcium 8.6 (*)    GFR, Estimated 31 (*)    All other components within normal limits  CBC - Abnormal; Notable for the following components:   WBC 12.1 (*)    RBC 3.64 (*)    Hemoglobin 9.6 (*)    HCT 30.8 (*)    All other components within normal limits  TROPONIN I (HIGH SENSITIVITY) - Abnormal; Notable for the following components:   Troponin I (High Sensitivity) 41 (*)    All other components within normal limits  TROPONIN I (HIGH SENSITIVITY) - Abnormal; Notable for the following components:   Troponin I (High Sensitivity) 66 (*)    All other components within normal limits  RESP PANEL BY RT-PCR (FLU A&B, COVID) ARPGX2  SARS CORONAVIRUS 2 (TAT 6-24 HRS)  BRAIN NATRIURETIC PEPTIDE  HIV ANTIBODY (ROUTINE TESTING W REFLEX)   I-STAT BETA HCG BLOOD, ED ([facility], [facility], [facility] ONLY)  CBG MONITORING, ED  TROPONIN I (HIGH SENSITIVITY)    EKG EKG Interpretation  Date/Time:  Monday August 29 2020 06:18:03 EST Ventricular Rate:  92 PR Interval:  188 QRS Duration: 86 QT Interval:  374 QTC Calculation: 462 R Axis:   67 Text Interpretation: Normal sinus rhythm Nonspecific ST abnormality Abnormal ECG Interpretation limited secondary to artifact Confirmed by Ripley Fraise [phone]) on 08/29/2020 6:29:45 AM   Radiology CT Angio Chest PE W and/or Wo Contrast  Result Date: 08/29/2020 CLINICAL DATA:  Chest pain, shortness of breath EXAM: CT ANGIOGRAPHY CHEST WITH CONTRAST TECHNIQUE: Multidetector CT imaging of the chest was performed using the standard protocol during bolus administration of intravenous contrast. Multiplanar CT image reconstructions and MIPs were obtained to evaluate the vascular anatomy. CONTRAST:  65 mL Omnipaque 350 IV COMPARISON:  02/11/2018 FINDINGS: Cardiovascular: Metallic device noted within the central left pulmonary artery compatible with pulmonary arterial pressure monitor, new since prior study. No filling defects seen to suggest pulmonary emboli. Heart is enlarged. Diffuse coronary  artery calcifications. Aorta normal caliber. Mild cardiomegaly. Mediastinum/Nodes: Mildly enlarged mediastinal lymph nodes, progressed since prior study. Index right paratracheal node has a short axis diameter of 13 mm. Mildly prominent bilateral hilar lymph nodes. No axillary adenopathy. Lungs/Pleura: Diffuse ground-glass opacities throughout the lungs. Vascular congestion. Small bilateral pleural effusions. Findings most suggestive of CHF. Upper Abdomen: Imaging into the upper abdomen demonstrates no acute findings. Musculoskeletal: Chest wall soft tissues are unremarkable. No acute bony abnormality. Review of the MIP images confirms the above findings. IMPRESSION: No evidence of pulmonary embolus. Cardiomegaly with vascular  congestion. Ground-glass opacities and small effusions. Findings most suggestive of mild CHF. Coronary artery disease. Pulmonary arterial pressure monitor within the left pulmonary artery. Electronically Signed   By: Rolm Baptise M.D.   On: 08/29/2020 09:56   DG Chest Port 1 View  Result Date: 08/29/2020 CLINICAL DATA:  Shortness of breath. EXAM: PORTABLE CHEST 1 VIEW COMPARISON:  Chest x-ray 02/11/2018, CT chest 02/11/2018 FINDINGS: The heart size and mediastinal contours are unchanged persistent cardiomegaly. Hilar vasculature prominence. CardioMEMS device overlying the left hilar region. Interval development of patchy airspace opacities that are more prominent within the mid to lower lung zone. Slightly increased interstitial markings. Possible trace bilateral pleural effusions. No significant pleural effusion bilaterally. No pneumothorax. No acute osseous abnormality. IMPRESSION: Vascular congestion with likely superimposed infection/inflammation. COVID-19 infection not excluded. Electronically Signed   By: Iven Finn M.D.   On: 08/29/2020 06:55    Procedures Procedures   Medications Ordered in ED Medications  nitroGLYCERIN (NITROSTAT) SL tablet 0.4 mg (0.4 mg Sublingual Given 08/29/20 0721)  carvedilol (COREG) tablet 12.5 mg (has no administration in time range)  isosorbide mononitrate (IMDUR) 24 hr tablet 60 mg (60 mg Oral Given 08/29/20 1239)  sodium chloride flush (NS) 0.9 % injection 3 mL (3 mLs Intravenous Given 08/29/20 1248)  sodium chloride flush (NS) 0.9 % injection 3 mL (has no administration in time range)  0.9 %  sodium chloride infusion (has no administration in time range)  acetaminophen (TYLENOL) tablet 650 mg (has no administration in time range)  ondansetron (ZOFRAN) injection 4 mg (has no administration in time range)  morphine 4 MG/ML injection 4 mg (4 mg Intramuscular Given 08/29/20 0823)  ondansetron (ZOFRAN) tablet 4 mg (4 mg Oral Given 08/29/20 0831)  iohexol  (OMNIPAQUE) 350 MG/ML injection 75 mL (75 mLs Intravenous Contrast Given 08/29/20 0938)  furosemide (LASIX) injection 40 mg (40 mg Intravenous Given 08/29/20 1241)    ED Course  I have reviewed the triage vital signs and the nursing notes.  Pertinent labs & imaging results that were available during my care of the patient were reviewed by me and considered in my medical decision making (see chart for details).    MDM Rules/Calculators/A&P                           History provided by patient with additional history obtained from chart review.    60 yo female presenting with sudden onset chest pain waking her from sleep. On ED arrival she is ill appearing although does not look toxic. She is hypoxic to 88% on room air, however on reassessment oxygen saturation 95%. Patient is tachypneic. She has bilateral LEE  at baseline per patient. Labs were collected in triage. EKG shows normal sinus rhythm with nonspecific ST abnormality. No STEMI. Heart score of 5. CBC with nonspecific leukocytosis 12.1, hemoglobin consistent with baseline BMP with hyperglycemia 226, BUN/Cr 34/1.87  is also consistent with baseline based on chart review. BNP within normal range. First troponin 41. Chest xray viewed by me shows vascular congestion with concern for superimposed infection/inflammation. Agree with radiologist impression. Patient given sublingual nitro x 1 without symptom improvement. IM morphine given for pain and antiemetic. Difficult stick, attempting IV access multiple times by RN. Covid and influenza tests are negative. CTA chest without signs of PE.  She does have groundglass opacity to suggest mild CHF.  Repeat EKG with second troponin is 66. No ischemic changes.  Patient now wearing 1 L nasal cannula as when she stood up and ambulated oxygen dropped to 86% on room air.  She typically does not wear oxygen during the day. She is more comfortable after morphine, still having 4/10 pain as chest pressure. IV  lasix ordered. Consulted cardiology and patient evaluated by Dr. Aundra Dubin and NP Ninfa Meeker with plan to admit.    Portions of this note were generated with Lobbyist. Dictation errors may occur despite best attempts at proofreading.   Final Clinical Impression(s) / ED Diagnoses Final diagnoses:  Acute on chronic diastolic heart failure [facility])    Rx / DC Orders ED Discharge Orders    None       Lewanda Rife 08/29/20 1249    Lajean Saver, MD 08/30/20 1418

## 2020-08-30 ENCOUNTER — Encounter (HOSPITAL_COMMUNITY): Payer: Self-pay | Admitting: Cardiology

## 2020-08-30 ENCOUNTER — Other Ambulatory Visit: Payer: Self-pay

## 2020-08-30 DIAGNOSIS — R040 Epistaxis: Secondary | ICD-10-CM | POA: Diagnosis not present

## 2020-08-30 DIAGNOSIS — G4733 Obstructive sleep apnea (adult) (pediatric): Secondary | ICD-10-CM | POA: Diagnosis not present

## 2020-08-30 DIAGNOSIS — R079 Chest pain, unspecified: Secondary | ICD-10-CM | POA: Diagnosis not present

## 2020-08-30 DIAGNOSIS — Z6841 Body Mass Index (BMI) 40.0 and over, adult: Secondary | ICD-10-CM | POA: Diagnosis not present

## 2020-08-30 DIAGNOSIS — E1022 Type 1 diabetes mellitus with diabetic chronic kidney disease: Secondary | ICD-10-CM | POA: Diagnosis not present

## 2020-08-30 DIAGNOSIS — Z7902 Long term (current) use of antithrombotics/antiplatelets: Secondary | ICD-10-CM | POA: Diagnosis not present

## 2020-08-30 DIAGNOSIS — N179 Acute kidney failure, unspecified: Secondary | ICD-10-CM | POA: Diagnosis not present

## 2020-08-30 DIAGNOSIS — I5033 Acute on chronic diastolic (congestive) heart failure: Secondary | ICD-10-CM | POA: Diagnosis not present

## 2020-08-30 DIAGNOSIS — Z8249 Family history of ischemic heart disease and other diseases of the circulatory system: Secondary | ICD-10-CM | POA: Diagnosis not present

## 2020-08-30 DIAGNOSIS — N1832 Chronic kidney disease, stage 3b: Secondary | ICD-10-CM | POA: Diagnosis present

## 2020-08-30 DIAGNOSIS — Z20822 Contact with and (suspected) exposure to covid-19: Secondary | ICD-10-CM | POA: Diagnosis not present

## 2020-08-30 DIAGNOSIS — K219 Gastro-esophageal reflux disease without esophagitis: Secondary | ICD-10-CM | POA: Diagnosis present

## 2020-08-30 DIAGNOSIS — E669 Obesity, unspecified: Secondary | ICD-10-CM | POA: Diagnosis present

## 2020-08-30 DIAGNOSIS — M1000 Idiopathic gout, unspecified site: Secondary | ICD-10-CM | POA: Diagnosis present

## 2020-08-30 DIAGNOSIS — I214 Non-ST elevation (NSTEMI) myocardial infarction: Secondary | ICD-10-CM | POA: Diagnosis present

## 2020-08-30 DIAGNOSIS — Z79899 Other long term (current) drug therapy: Secondary | ICD-10-CM | POA: Diagnosis not present

## 2020-08-30 DIAGNOSIS — N141 Nephropathy induced by other drugs, medicaments and biological substances: Secondary | ICD-10-CM | POA: Diagnosis not present

## 2020-08-30 DIAGNOSIS — I2510 Atherosclerotic heart disease of native coronary artery without angina pectoris: Secondary | ICD-10-CM | POA: Diagnosis not present

## 2020-08-30 DIAGNOSIS — D62 Acute posthemorrhagic anemia: Secondary | ICD-10-CM | POA: Diagnosis not present

## 2020-08-30 DIAGNOSIS — D509 Iron deficiency anemia, unspecified: Secondary | ICD-10-CM | POA: Diagnosis present

## 2020-08-30 DIAGNOSIS — D72829 Elevated white blood cell count, unspecified: Secondary | ICD-10-CM | POA: Diagnosis present

## 2020-08-30 DIAGNOSIS — Z7982 Long term (current) use of aspirin: Secondary | ICD-10-CM | POA: Diagnosis not present

## 2020-08-30 DIAGNOSIS — I130 Hypertensive heart and chronic kidney disease with heart failure and stage 1 through stage 4 chronic kidney disease, or unspecified chronic kidney disease: Secondary | ICD-10-CM | POA: Diagnosis not present

## 2020-08-30 DIAGNOSIS — E782 Mixed hyperlipidemia: Secondary | ICD-10-CM | POA: Diagnosis present

## 2020-08-30 DIAGNOSIS — T508X5A Adverse effect of diagnostic agents, initial encounter: Secondary | ICD-10-CM | POA: Diagnosis present

## 2020-08-30 LAB — BASIC METABOLIC PANEL
Anion gap: 12 (ref 5–15)
BUN: 43 mg/dL — ABNORMAL HIGH (ref 6–20)
CO2: 19 mmol/L — ABNORMAL LOW (ref 22–32)
Calcium: 8.2 mg/dL — ABNORMAL LOW (ref 8.9–10.3)
Chloride: 104 mmol/L (ref 98–111)
Creatinine, Ser: 2.27 mg/dL — ABNORMAL HIGH (ref 0.44–1.00)
GFR, Estimated: 25 mL/min — ABNORMAL LOW (ref >60)
Glucose, Bld: 292 mg/dL — ABNORMAL HIGH (ref 70–99)
Potassium: 4.7 mmol/L (ref 3.5–5.1)
Sodium: 135 mmol/L (ref 135–145)

## 2020-08-30 LAB — HEPARIN LEVEL (UNFRACTIONATED)
Heparin Unfractionated: 0.11 IU/mL — ABNORMAL LOW (ref 0.30–0.70)
Heparin Unfractionated: 0.21 IU/mL — ABNORMAL LOW (ref 0.30–0.70)
Heparin Unfractionated: 0.23 IU/mL — ABNORMAL LOW (ref 0.30–0.70)

## 2020-08-30 LAB — CBC
HCT: 27.1 % — ABNORMAL LOW (ref 36.0–46.0)
Hemoglobin: 8.4 g/dL — ABNORMAL LOW (ref 12.0–15.0)
MCH: 25.9 pg — ABNORMAL LOW (ref 26.0–34.0)
MCHC: 31 g/dL (ref 30.0–36.0)
MCV: 83.6 fL (ref 80.0–100.0)
Platelets: 310 10*3/uL (ref 150–400)
RBC: 3.24 MIL/uL — ABNORMAL LOW (ref 3.87–5.11)
RDW: 15.3 % (ref 11.5–15.5)
WBC: 13.8 10*3/uL — ABNORMAL HIGH (ref 4.0–10.5)
nRBC: 0 % (ref 0.0–0.2)

## 2020-08-30 LAB — TROPONIN I (HIGH SENSITIVITY)
Troponin I (High Sensitivity): 1421 ng/L
Troponin I (High Sensitivity): 1471 ng/L (ref <18)

## 2020-08-30 LAB — GLUCOSE, CAPILLARY
Glucose-Capillary: 271 mg/dL — ABNORMAL HIGH (ref 70–99)
Glucose-Capillary: 249 mg/dL — ABNORMAL HIGH (ref 70–99)
Glucose-Capillary: 257 mg/dL — ABNORMAL HIGH (ref 70–99)
Glucose-Capillary: 188 mg/dL — ABNORMAL HIGH (ref 70–99)

## 2020-08-30 LAB — HEMOGLOBIN A1C
Hgb A1c MFr Bld: 6.1 % — ABNORMAL HIGH (ref 4.8–5.6)
Mean Plasma Glucose: 128 mg/dL

## 2020-08-30 MED ORDER — CLOPIDOGREL BISULFATE 75 MG PO TABS
75.0000 mg | ORAL_TABLET | Freq: Every day | ORAL | Status: DC
  Administered 2020-08-30 – 2020-09-03 (×5): 75 mg via ORAL
  Filled 2020-08-30 (×6): qty 1

## 2020-08-30 MED ORDER — NITROGLYCERIN IN D5W 200-5 MCG/ML-% IV SOLN
0.0000 ug/min | INTRAVENOUS | Status: DC
  Administered 2020-08-30: 5 ug/min via INTRAVENOUS
  Administered 2020-09-01: 15 ug/min via INTRAVENOUS
  Filled 2020-08-30 (×2): qty 250

## 2020-08-30 MED ORDER — SODIUM CHLORIDE 0.9 % IV SOLN: INTRAVENOUS | Status: AC

## 2020-08-30 MED ORDER — INSULIN DETEMIR 100 UNIT/ML ~~LOC~~ SOLN
40.0000 [IU] | Freq: Every day | SUBCUTANEOUS | Status: DC
  Administered 2020-08-30 – 2020-09-03 (×5): 40 [IU] via SUBCUTANEOUS
  Filled 2020-08-30 (×5): qty 0.4

## 2020-08-30 NOTE — Progress Notes (Signed)
   08/30/20 0947  Mechanical VTE Prophylaxis (All Areas)  Mechanical VTE Prophylaxis Antiembolism stockings, knee (TED hose)  Mechanical VTE Prophylaxis Intervention On

## 2020-08-30 NOTE — Progress Notes (Signed)
This nurse spoke with [facility] RT regarding CPAP orders for pt.  RT stated that will set up cpap at night for pt.

## 2020-08-30 NOTE — Progress Notes (Signed)
RN spoke with PA regarding fluid restrictions.  Orders modified. 1500 FR orders placed.

## 2020-08-30 NOTE — Progress Notes (Signed)
ANTICOAGULATION CONSULT NOTE - Follow Up Consult  Pharmacy Consult for IV heparin Indication: chest pain/ACS  Allergies  Allergen Reactions  . Naproxen Hives and Rash  . Celecoxib Other (See Comments)    Stomach pain  . Aspirin Other (See Comments)    CAN NOT TAKE DUE TO ULCERS  . Glucosamine Hives, Swelling and Other (See Comments)    Angioedema   . Metoclopramide Other (See Comments)    Facial twitching and stuttering  . Metolazone Other (See Comments)    Acute renal failure  . Shellfish-Derived Products Hives, Swelling and Other (See Comments)    Angioedema   . Tramadol Nausea And Vomiting         Patient Measurements: Height: 5\' 4"  (162.6 cm) Weight: 114.5 kg (252 lb 8 oz) IBW/kg (Calculated) : 54.7 Heparin Dosing Weight: ~ 80 kg  Vital Signs: Temp: 99.1 F (37.3 C) (02/15 0047) Temp Source: Oral (02/15 0047) BP: 112/56 (02/15 0047) Pulse Rate: 84 (02/15 0047)  Labs: Recent Labs    08/29/20 0636 08/29/20 0853 08/29/20 1148 08/29/20 1700 08/30/20 0006  HGB 9.6*  --   --   --  8.4*  HCT 30.8*  --   --   --  27.1*  PLT 320  --   --   --  310  HEPARINUNFRC  --   --   --   --  0.11*  CREATININE 1.87*  --   --   --  2.27*  TROPONINIHS 41* 66* 612* 1,917*  --     Estimated Creatinine Clearance: 33.9 mL/min (A) (by C-G formula based on SCr of 2.27 mg/dL (H)).   Medical History: Past Medical History:  Diagnosis Date  . Acquired absence of left great toe ([place])   . Acquired absence of other left toe(s) ([place])   . Anginal pain ([place])   . Atherosclerotic heart disease of native coronary artery without angina pectoris   . CAD (coronary artery disease)    08/2016 DES to RCA, also with history of LAD stenting.   . Chronic diastolic (congestive) heart failure ([place])   . Chronic kidney disease, stage 2 (mild)   . Chronic systolic (congestive) heart failure ([place])   . Diabetes ([place])   . Diastolic CHF ([place])   . Gastro-esophageal reflux disease without esophagitis    . Hyperlipidemia   . Hypertension   . Hypertensive heart disease with heart failure ([place])   . Hypoxemia   . Idiopathic gout, multiple sites   . Localized edema   . Low back pain   . Mixed hyperlipidemia   . Mixed incontinence   . Morbid (severe) obesity due to excess calories ([place])   . OSA (obstructive sleep apnea)   . Other specified hypothyroidism   . Primary insomnia   . Type 2 diabetes mellitus with diabetic nephropathy (HCC)     Medications:  Infusions:  . sodium chloride    . sodium chloride    . sodium chloride    . heparin 1,150 Units/hr (08/29/20 1403)    Assessment: 60 yo female admitted with chest pain. Pt was started on IV heparin without bolus but now with significant elevation in troponin.   SCr 1.87, H/H low, Plt wnl. No overt s/s of bleeding   2/15 AM update:  Heparin level sub-therapeutic  No issues per RN  Goal of Therapy:  Heparin level 0.3-0.7 units/ml Monitor platelets by anticoagulation protocol: Yes   Plan:  Inc heparin to 1350 units/hr Check heparin level in 8  hrs Daily heparin level and CBC.  Narda Bonds, PharmD, BCPS Clinical Pharmacist Phone: [phone]

## 2020-08-30 NOTE — Progress Notes (Signed)
ANTICOAGULATION CONSULT NOTE - Follow Up Consult  Pharmacy Consult for IV heparin Indication: chest pain/ACS  Allergies  Allergen Reactions  . Naproxen Hives and Rash  . Celecoxib Other (See Comments)    Stomach pain  . Aspirin Other (See Comments)    CAN NOT TAKE DUE TO ULCERS  . Glucosamine Hives, Swelling and Other (See Comments)    Angioedema   . Metoclopramide Other (See Comments)    Facial twitching and stuttering  . Metolazone Other (See Comments)    Acute renal failure  . Shellfish-Derived Products Hives, Swelling and Other (See Comments)    Angioedema   . Tramadol Nausea And Vomiting         Patient Measurements: Height: 5\' 4"  (162.6 cm) Weight: 114.9 kg (253 lb 4.8 oz) IBW/kg (Calculated) : 54.7 Heparin Dosing Weight: 82.2kg  Vital Signs: Temp: 98.5 F (36.9 C) (02/15 1613) Temp Source: Oral (02/15 1613) BP: 152/62 (02/15 1633) Pulse Rate: 97 (02/15 1633)  Labs: Recent Labs    08/29/20 0636 08/29/20 0853 08/29/20 1148 08/29/20 1700 08/30/20 0006 08/30/20 0950 08/30/20 1814  HGB 9.6*  --   --   --  8.4*  --   --   HCT 30.8*  --   --   --  27.1*  --   --   PLT 320  --   --   --  310  --   --   HEPARINUNFRC  --   --   --   --  0.11* 0.21* 0.23*  CREATININE 1.87*  --   --   --  2.27*  --   --   TROPONINIHS 41* 66* 612* 1,917*  --   --   --     Estimated Creatinine Clearance: 34 mL/min (A) (by C-G formula based on SCr of 2.27 mg/dL (H)).   Medical History: Past Medical History:  Diagnosis Date  . Acquired absence of left great toe ([place])   . Acquired absence of other left toe(s) ([place])   . Anginal pain ([place])   . Atherosclerotic heart disease of native coronary artery without angina pectoris   . CAD (coronary artery disease)    08/2016 DES to RCA, also with history of LAD stenting.   . Chronic diastolic (congestive) heart failure ([place])   . Chronic kidney disease, stage 2 (mild)   . Chronic systolic (congestive) heart failure ([place])   . Diabetes  ([place])   . Diastolic CHF ([place])   . Gastro-esophageal reflux disease without esophagitis   . Hyperlipidemia   . Hypertension   . Hypertensive heart disease with heart failure ([place])   . Hypoxemia   . Idiopathic gout, multiple sites   . Localized edema   . Low back pain   . Mixed hyperlipidemia   . Mixed incontinence   . Morbid (severe) obesity due to excess calories ([place])   . OSA (obstructive sleep apnea)   . Other specified hypothyroidism   . Primary insomnia   . Type 2 diabetes mellitus with diabetic nephropathy (HCC)     Medications:  Infusions:  . sodium chloride    . sodium chloride    . sodium chloride 10 mL/hr at 08/30/20 0539  . sodium chloride 50 mL/hr at 08/30/20 1604  . heparin 1,550 Units/hr (08/30/20 1123)  . nitroGLYCERIN 15 mcg/min (08/30/20 1631)    Assessment: 60 yo female admitted with chest pain. Pt was started on IV heparin without bolus but now with significant elevation in troponin.   Heparin  level remains subtherapeutic at 0.23 after rate increase this AM. Hg down to 8.4 today, plt wnl. Noted SCr trend up to 2.27. No bleeding or issues with infusion per discussion with RN.  Goal of Therapy:  Heparin level 0.3-0.7 units/ml Monitor platelets by anticoagulation protocol: Yes   Plan:  Increase heparin to 1700 units/hr F/u 8hr heparin level Monitor daily CBC, s/sx bleeding   Arturo Morton, PharmD, BCPS Please check AMION for all [place] contact numbers Clinical Pharmacist 08/30/2020 7:42 PM

## 2020-08-30 NOTE — Progress Notes (Signed)
RT NOTE:  Pt has home CPAP setup at bedside. Pt agrees she can manage machine when ready. Pt aware to call RN to hook up O2. RN aware.

## 2020-08-30 NOTE — Progress Notes (Addendum)
Inpatient Diabetes Program Recommendations  AACE/ADA: New Consensus Statement on Inpatient Glycemic Control (2015)  Target Ranges:  Prepandial:   less than 140 mg/dL      Peak postprandial:   less than 180 mg/dL (1-2 hours)      Critically ill patients:  140 - 180 mg/dL   Results for Dana [name], Dana [name] (MRN [id number]) as of 08/30/2020 07:28  Ref. Range 08/29/2020 12:45 08/29/2020 17:31 08/29/2020 18:19 08/29/2020 21:30 08/30/2020 06:08  Glucose-Capillary Latest Ref Range: 70 - 99 mg/dL 234 (H) 246 (H) 288 (H)  8 units NOVOLOG  264 (H) 271 (H)  8 units NOVOLOG    Results for Dana [name], Dana [name] (MRN [id number]) as of 08/30/2020 07:28  Ref. Range 06/28/2020 10:03  Hemoglobin A1C Latest Ref Range: 4.8 - 5.6 % 6.7 (H)   Admit with: CP/ A/C Diastolic HF  History: DM, CKD, CHF  Home DM Meds: Farxiga 10 mg Daily       Novolog 10 units TID with meals       Levemir 50 units Daily  Current Orders: Novolog Moderate Correction Scale/ SSI (0-15 units) TID AC     MD- Note CBG 271 this AM.  Patient takes Levemir 50 units Daily at home along with Novolog.  Please consider the following:  Start Levemir 40 units Daily (please start today) (80% total home dose to start)  Can start after Heart Cath today     --Will follow patient during hospitalization--  Wyn Quaker RN, MSN, CDE Diabetes Coordinator Inpatient Glycemic Control Team Team Pager: [phone] (8a-5p)

## 2020-08-30 NOTE — Progress Notes (Addendum)
Advanced Heart Failure Rounding Note  PCP-Cardiologist: No primary care provider on file.    Patient Profile   [name] is 61 year old with history of diastolic heart failure, CAD, DES RCA/LAD 2018, DMI, htn, hyperlipidemia, PAD, and OSA.  Admitted w/ CP and a/c diastolic HF. Ruled in for NSTEMI.   HS-TnI 41 => 66=>612=>1,917  Chest CT negative for PE  Echo w/ preserved LVEF, 55-60%. No RWMAs.   Subjective:    On IV heparin. Cath rescheduled for tomorrow given AKI, SCr 1.87>>2.27 overnight (baseline ~1.5).   Continues w/ mild intermittent CP, currently 3/10.  SBP 120s.    Objective:   Weight Range: 114.9 kg Body mass index is 43.48 kg/m.   Vital Signs:   Temp:  [98 F (36.7 C)-99.1 F (37.3 C)] 98 F (36.7 C) (02/15 0349) Pulse Rate:  [77-107] 83 (02/15 0820) Resp:  [13-25] 15 (02/15 0400) BP: (109-167)/(41-90) 133/74 (02/15 0820) SpO2:  [94 %-100 %] 97 % (02/15 0400) Weight:  [114.5 kg-114.9 kg] 114.9 kg (02/15 0349) Last BM Date: 08/29/20  Weight change: Filed Weights   08/29/20 1808 08/30/20 0349  Weight: 114.5 kg 114.9 kg    Intake/Output:   Intake/Output Summary (Last 24 hours) at 08/30/2020 0953 Last data filed at 08/30/2020 0845 Gross per 24 hour  Intake 913.36 ml  Output 1750 ml  Net -836.64 ml      Physical Exam    General:  Fatigue appearing, obese middle aged WF. No resp difficulty HEENT: Normal Neck: Supple. Thick neck JVD ~10 cm . Carotids 2+ bilat; no bruits. No lymphadenopathy or thyromegaly appreciated. Cor: PMI nondisplaced. Regular rate & rhythm. No rubs, gallops or murmurs. Lungs: Clear Abdomen: obese, Soft, nontender, nondistended. No hepatosplenomegaly. No bruits or masses. Good bowel sounds. Extremities: No cyanosis, clubbing, rash, edema, LEs obese  Neuro: Alert & orientedx3, cranial nerves grossly intact. moves all 4 extremities w/o difficulty. Affect pleasant   Telemetry   NSR 90 bpm   EKG    No new EKG to  review.  Repeat 12 lead EKG pending   Labs    CBC Recent Labs    08/29/20 0636 08/30/20 0006  WBC 12.1* 13.8*  HGB 9.6* 8.4*  HCT 30.8* 27.1*  MCV 84.6 83.6  PLT 320 454   Basic Metabolic Panel Recent Labs    08/29/20 0636 08/30/20 0006  NA 138 135  K 4.1 4.7  CL 106 104  CO2 22 19*  GLUCOSE 226* 292*  BUN 34* 43*  CREATININE 1.87* 2.27*  CALCIUM 8.6* 8.2*   Liver Function Tests No results for input(s): AST, ALT, ALKPHOS, BILITOT, PROT, ALBUMIN in the last 72 hours. No results for input(s): LIPASE, AMYLASE in the last 72 hours. Cardiac Enzymes No results for input(s): CKTOTAL, CKMB, CKMBINDEX, TROPONINI in the last 72 hours.  BNP: BNP (last 3 results) Recent Labs    08/29/20 0636  BNP 87.6    ProBNP (last 3 results) No results for input(s): PROBNP in the last 8760 hours.   D-Dimer No results for input(s): DDIMER in the last 72 hours. Hemoglobin A1C No results for input(s): HGBA1C in the last 72 hours. Fasting Lipid Panel No results for input(s): CHOL, HDL, LDLCALC, TRIG, CHOLHDL, LDLDIRECT in the last 72 hours. Thyroid Function Tests No results for input(s): TSH, T4TOTAL, T3FREE, THYROIDAB in the last 72 hours.  Invalid input(s): FREET3  Other results:   Imaging    ECHOCARDIOGRAM COMPLETE  Result Date: 08/29/2020  ECHOCARDIOGRAM REPORT   Patient Name:   Dana [name] Date of Exam: 08/29/2020 Medical Rec #:  [id number]      Height:       65.0 in Accession #:    [id number]     Weight:       259.0 lb Date of Birth:  1960/07/19      BSA:          2.208 m Patient Age:    50 years       BP:           146/44 mmHg Patient Gender: F              HR:           87 bpm. Exam Location:  Inpatient Procedure: 2D Echo, Cardiac Doppler, Color Doppler and Intracardiac            Opacification Agent Indications:    CHF-Acute Diastolic  History:        Patient has prior history of Echocardiogram examinations. CAD;                 Risk Factors:Diabetes, Hypertension,  Dyslipidemia and Sleep                 Apnea. PAD. History of atrial fibrillation. S/P ablation per                 patient.  Sonographer:    Clayton Lefort RDCS (AE) Referring Phys: [place]  1. Left ventricular ejection fraction, by estimation, is 55 to 60%. The left ventricle has normal function. The left ventricle has no regional wall motion abnormalities. There is mild left ventricular hypertrophy. Left ventricular diastolic parameters are consistent with Grade II diastolic dysfunction (pseudonormalization).  2. Right ventricular systolic function is normal. The right ventricular size is mildly enlarged. Tricuspid regurgitation signal is inadequate for assessing PA pressure.  3. Left atrial size was mildly dilated.  4. Right atrial size was mildly dilated.  5. The mitral valve is abnormal. Moderate mitral valve regurgitation. No evidence of mitral stenosis.  6. The aortic valve is tricuspid. Aortic valve regurgitation is not visualized. No aortic stenosis is present.  7. The inferior vena cava is normal in size with greater than 50% respiratory variability, suggesting right atrial pressure of 3 mmHg. This is not consistent with volume overload. FINDINGS  Left Ventricle: Left ventricular ejection fraction, by estimation, is 55 to 60%. The left ventricle has normal function. The left ventricle has no regional wall motion abnormalities. Definity contrast agent was given IV to delineate the left ventricular  endocardial borders. The left ventricular internal cavity size was normal in size. There is mild left ventricular hypertrophy. Left ventricular diastolic parameters are consistent with Grade II diastolic dysfunction (pseudonormalization). Right Ventricle: The right ventricular size is mildly enlarged. No increase in right ventricular wall thickness. Right ventricular systolic function is normal. Tricuspid regurgitation signal is inadequate for assessing PA pressure. Left Atrium: Left atrial  size was mildly dilated. Right Atrium: Right atrial size was mildly dilated. Pericardium: There is no evidence of pericardial effusion. Mitral Valve: The mitral valve is abnormal. Mild mitral annular calcification. Moderate mitral valve regurgitation. No evidence of mitral valve stenosis. MV peak gradient, 6.2 mmHg. The mean mitral valve gradient is 2.0 mmHg. Tricuspid Valve: The tricuspid valve is normal in structure. Tricuspid valve regurgitation is not demonstrated. Aortic Valve: The aortic valve is tricuspid. Aortic valve regurgitation is not visualized. No aortic  stenosis is present. Aortic valve mean gradient measures 4.0 mmHg. Aortic valve peak gradient measures 6.6 mmHg. Aortic valve area, by VTI measures 1.93 cm. Pulmonic Valve: The pulmonic valve was normal in structure. Pulmonic valve regurgitation is not visualized. Aorta: The aortic root is normal in size and structure. Venous: The inferior vena cava is normal in size with greater than 50% respiratory variability, suggesting right atrial pressure of 3 mmHg. IAS/Shunts: No atrial level shunt detected by color flow Doppler.  LEFT VENTRICLE PLAX 2D LVIDd:         5.10 cm  Diastology LVIDs:         3.90 cm  LV e' medial:    4.74 cm/s LV PW:         1.30 cm  LV E/e' medial:  19.6 LV IVS:        1.40 cm  LV e' lateral:   5.15 cm/s LVOT diam:     1.90 cm  LV E/e' lateral: 18.1 LV SV:         45 LV SV Index:   20 LVOT Area:     2.84 cm  RIGHT VENTRICLE             IVC RV Basal diam:  4.10 cm     IVC diam: 1.50 cm RV Mid diam:    3.60 cm RV S prime:     13.80 cm/s TAPSE (M-mode): 2.7 cm LEFT ATRIUM             Index       RIGHT ATRIUM           Index LA diam:        3.30 cm 1.49 cm/m  RA Area:     20.90 cm LA Vol (A2C):   44.7 ml 20.25 ml/m RA Volume:   64.90 ml  29.40 ml/m LA Vol (A4C):   39.8 ml 18.03 ml/m LA Biplane Vol: 43.9 ml 19.88 ml/m  AORTIC VALVE AV Area (Vmax):    1.80 cm AV Area (Vmean):   1.79 cm AV Area (VTI):     1.93 cm AV Vmax:            128.00 cm/s AV Vmean:          86.900 cm/s AV VTI:            0.231 m AV Peak Grad:      6.6 mmHg AV Mean Grad:      4.0 mmHg LVOT Vmax:         81.40 cm/s LVOT Vmean:        54.800 cm/s LVOT VTI:          0.157 m LVOT/AV VTI ratio: 0.68  AORTA Ao Root diam: 3.20 cm Ao Asc diam:  3.00 cm MITRAL VALVE MV Area (PHT): 3.37 cm    SHUNTS MV Area VTI:   1.55 cm    Systemic VTI:  0.16 m MV Peak grad:  6.2 mmHg    Systemic Diam: 1.90 cm MV Mean grad:  2.0 mmHg MV Vmax:       1.24 m/s MV Vmean:      62.5 cm/s MV Decel Time: 225 msec MR Peak grad: 74.3 mmHg MR Mean grad: 49.0 mmHg MR Vmax:      431.00 cm/s MR Vmean:     332.0 cm/s MV E velocity: 93.00 cm/s MV A velocity: 55.10 cm/s MV E/A ratio:  1.69 Loralie Champagne MD Electronically signed by Loralie Champagne MD Signature  Date/Time: 08/29/2020/5:15:34 PM    Final       Medications:     Scheduled Medications: . aspirin EC  81 mg Oral Daily  . carvedilol  12.5 mg Oral BID WC  . ezetimibe  10 mg Oral Daily  . insulin aspart  0-15 Units Subcutaneous TID WC  . isosorbide mononitrate  60 mg Oral Daily  . pantoprazole  40 mg Oral Daily  . sodium chloride flush  3 mL Intravenous Q12H  . sodium chloride flush  3 mL Intravenous Q12H     Infusions: . sodium chloride    . sodium chloride    . sodium chloride 10 mL/hr at 08/30/20 0539  . heparin 1,350 Units/hr (08/30/20 0154)     PRN Medications:  sodium chloride, sodium chloride, acetaminophen, HYDROcodone-acetaminophen, nitroGLYCERIN, ondansetron (ZOFRAN) IV, sodium chloride flush, sodium chloride flush     Assessment/Plan   1. NSTEMI/ CAD:  - H/o DES RCA/LAD in 2018 - HS-TnI 41 => 66=>612=>1,917  - Chest CT negative for PE - 2D echo normal EF 55-60%. No RWMAs. RV normal. No effusion  - Needs LHC but will postpone given AKI w/ SCr up to 2.2 - Continue IV heparin - Add nitro gtt for persistent CP  - ASA 81 + Plavix 75  - Coreg 12.5 mg bid  - on Repatha for HLD   2. Acute on Chronic  Diastolic Heart Failure - Suspect in setting of dietary indiscretion/salt load.  - Vascular congestion on chest imaging - BNP only 87 but likely falsely low in setting of obesity  - no current dyspnea at rest - hold additional IV Lasix for now given AKI and need for LHC  - Hold Farxiga and spiro  - Continue Coreg 12.5 mg bid   3. AKI on Stage III CKD  - baseline SCr ~1.4 - 1.87 on admit, up to 2.27 today  - got contrast w/ chest CTA, likely contrast nephropathy  - give IVF hydration  - Hold diuretics and home Farxiga and spiro  - avoid hypotension   4. HLD - on Repatha as outpatient, followed by Dr. Debara Pickett   5. OSA - order CPAP qhs   6. Type 2 DM  - last Hgb A1c 12/21 6.7 - SSI  - diabetes coordinator recommends Levemir   Length of Stay: 0  Lyda Jester, PA-C  08/30/2020, 9:53 AM  Advanced Heart Failure Team Pager [phone] (M-F; 7a - 4p)  Please contact [facility] for night-coverage after hours (4p -7a ) and weekends on [email or web address]  Patient seen and examined with the above-signed Advanced Practice Provider and/or Housestaff. I personally reviewed laboratory data, imaging studies and relevant notes. I independently examined the patient and formulated the important aspects of the plan. I have edited the note to reflect any of my changes or salient points. I have personally discussed the plan with the patient and/or family.  [place] up to 1,917. Occasional sharp CP. Mild SOB. Cath cancelled today due to AKI. CTA chest yesterday no PE. On heparin. No bleeding   General:  Sitting up on side of bed. No resp difficulty HEENT: normal Neck: supple. no JVD. Carotids 2+ bilat; no bruits. No lymphadenopathy or thryomegaly appreciated. Cor: PMI nondisplaced. Regular rate & rhythm. No rubs, gallops or murmurs. Lungs: clear Abdomen: obese, soft, nontender, nondistended. No hepatosplenomegaly. No bruits or masses. Good bowel sounds. Extremities: no cyanosis, clubbing, rash,  edema Neuro: alert & orientedx3, cranial nerves grossly intact. moves all 4 extremities w/o difficulty. Affect pleasant  Labwork consistent with NSTEMI. Will need cath but has AKI (contrast related). Will start gentle IVF (watch HF status). Continue heparin. Add IV NTG.   Timing of cath TBA.  Will had respiratory set up patient's home CPAP.   [name], MD  11:36 AM

## 2020-08-30 NOTE — Progress Notes (Addendum)
   08/30/20 1613 08/30/20 1627 08/30/20 1633  Vitals  Temp 98.5 F (36.9 C)  --   --   Temp Source Oral  --   --   BP (!) 147/64 (!) 157/77 (!) 152/62  MAP (mmHg) 88 99 88  BP Location Left Arm Left Arm Left Arm  BP Method Automatic Automatic Automatic  Patient Position (if appropriate) Lying Lying Lying  Pulse Rate 99 82 97  Pulse Rate Source Monitor Monitor Monitor  ECG Heart Rate 97 87 82  Resp 18  --  18  MEWS COLOR  MEWS Score Color Green Green Green  Oxygen Therapy  SpO2 97 % 100 % 99 %  O2 Device Room Air Nasal Cannula Nasal Cannula  O2 Flow Rate (L/min)  --  2 L/min 2 L/min  Pain Assessment  Pain Scale 0-10 0-10 0-10  Pain Score 6 2 0  Pain Type Acute pain  --   --   Pain Location Chest  --   --   Pain Orientation Left  --   --   Pain Descriptors / Indicators Pressure  --   --   Pain Intervention(s) Medication (See eMAR) (NTG gtt changed to 21mcg/min) Medication (See eMAR) (NTG gtt changed to 15 mcg/min)  --   MEWS Score  MEWS Temp 0 0 0  MEWS Systolic 0 0 0  MEWS Pulse 0 0 0  MEWS RR 0 0 0  MEWS LOC 0 0 0  MEWS Score 0 0 0

## 2020-08-30 NOTE — Progress Notes (Signed)
ANTICOAGULATION CONSULT NOTE - Follow Up Consult  Pharmacy Consult for IV heparin Indication: chest pain/ACS  Allergies  Allergen Reactions  . Naproxen Hives and Rash  . Celecoxib Other (See Comments)    Stomach pain  . Aspirin Other (See Comments)    CAN NOT TAKE DUE TO ULCERS  . Glucosamine Hives, Swelling and Other (See Comments)    Angioedema   . Metoclopramide Other (See Comments)    Facial twitching and stuttering  . Metolazone Other (See Comments)    Acute renal failure  . Shellfish-Derived Products Hives, Swelling and Other (See Comments)    Angioedema   . Tramadol Nausea And Vomiting         Patient Measurements: Height: 5\' 4"  (162.6 cm) Weight: 114.9 kg (253 lb 4.8 oz) IBW/kg (Calculated) : 54.7 Heparin Dosing Weight: ~ 80 kg  Vital Signs: Temp: 98 F (36.7 C) (02/15 0349) Temp Source: Oral (02/15 0349) BP: 133/74 (02/15 0820) Pulse Rate: 83 (02/15 0820)  Labs: Recent Labs    08/29/20 0636 08/29/20 0853 08/29/20 1148 08/29/20 1700 08/30/20 0006 08/30/20 0950  HGB 9.6*  --   --   --  8.4*  --   HCT 30.8*  --   --   --  27.1*  --   PLT 320  --   --   --  310  --   HEPARINUNFRC  --   --   --   --  0.11* 0.21*  CREATININE 1.87*  --   --   --  2.27*  --   TROPONINIHS 41* 66* 612* 1,917*  --   --     Estimated Creatinine Clearance: 34 mL/min (A) (by C-G formula based on SCr of 2.27 mg/dL (H)).   Medical History: Past Medical History:  Diagnosis Date  . Acquired absence of left great toe ([place])   . Acquired absence of other left toe(s) ([place])   . Anginal pain ([place])   . Atherosclerotic heart disease of native coronary artery without angina pectoris   . CAD (coronary artery disease)    08/2016 DES to RCA, also with history of LAD stenting.   . Chronic diastolic (congestive) heart failure ([place])   . Chronic kidney disease, stage 2 (mild)   . Chronic systolic (congestive) heart failure ([place])   . Diabetes ([place])   . Diastolic CHF ([place])   .  Gastro-esophageal reflux disease without esophagitis   . Hyperlipidemia   . Hypertension   . Hypertensive heart disease with heart failure ([name])   . Hypoxemia   . Idiopathic gout, multiple sites   . Localized edema   . Low back pain   . Mixed hyperlipidemia   . Mixed incontinence   . Morbid (severe) obesity due to excess calories ([place])   . OSA (obstructive sleep apnea)   . Other specified hypothyroidism   . Primary insomnia   . Type 2 diabetes mellitus with diabetic nephropathy (HCC)     Medications:  Infusions:  . sodium chloride    . sodium chloride    . sodium chloride 10 mL/hr at 08/30/20 0539  . heparin 1,350 Units/hr (08/30/20 0154)    Assessment: 60 yo female admitted with chest pain. Pt was started on IV heparin without bolus but now with significant elevation in troponin.   Heparin level subtherapeutic but trending up, CBC stable.  Goal of Therapy:  Heparin level 0.3-0.7 units/ml Monitor platelets by anticoagulation protocol: Yes   Plan:  Increase heparin to 1550 units.h Recheck  heparin level in 8h   Arrie Senate, PharmD, [place], [facility] Clinical Pharmacist [phone] Please check AMION for all [place] numbers 08/30/2020

## 2020-08-31 ENCOUNTER — Encounter (HOSPITAL_COMMUNITY)
Admission: EM | Disposition: A | Discharge status: Home / Self Care | Payer: Self-pay | Source: Home / Self Care | Attending: Internal Medicine

## 2020-08-31 DIAGNOSIS — I5033 Acute on chronic diastolic (congestive) heart failure: Secondary | ICD-10-CM | POA: Diagnosis not present

## 2020-08-31 DIAGNOSIS — N179 Acute kidney failure, unspecified: Secondary | ICD-10-CM | POA: Diagnosis not present

## 2020-08-31 DIAGNOSIS — I214 Non-ST elevation (NSTEMI) myocardial infarction: Secondary | ICD-10-CM | POA: Diagnosis not present

## 2020-08-31 LAB — GLUCOSE, CAPILLARY
Glucose-Capillary: 149 mg/dL — ABNORMAL HIGH (ref 70–99)
Glucose-Capillary: 152 mg/dL — ABNORMAL HIGH (ref 70–99)
Glucose-Capillary: 208 mg/dL — ABNORMAL HIGH (ref 70–99)
Glucose-Capillary: 171 mg/dL — ABNORMAL HIGH (ref 70–99)

## 2020-08-31 LAB — CBC
HCT: 24.5 % — ABNORMAL LOW (ref 36.0–46.0)
Hemoglobin: 7.8 g/dL — ABNORMAL LOW (ref 12.0–15.0)
MCH: 26.5 pg (ref 26.0–34.0)
MCHC: 31.8 g/dL (ref 30.0–36.0)
MCV: 83.3 fL (ref 80.0–100.0)
Platelets: 296 10*3/uL (ref 150–400)
RBC: 2.94 MIL/uL — ABNORMAL LOW (ref 3.87–5.11)
RDW: 15.8 % — ABNORMAL HIGH (ref 11.5–15.5)
WBC: 13.9 10*3/uL — ABNORMAL HIGH (ref 4.0–10.5)
nRBC: 0 % (ref 0.0–0.2)

## 2020-08-31 LAB — HEPARIN LEVEL (UNFRACTIONATED)
Heparin Unfractionated: 0.39 IU/mL (ref 0.30–0.70)
Heparin Unfractionated: 0.47 IU/mL (ref 0.30–0.70)

## 2020-08-31 LAB — BASIC METABOLIC PANEL
Anion gap: 12 (ref 5–15)
BUN: 51 mg/dL — ABNORMAL HIGH (ref 6–20)
CO2: 18 mmol/L — ABNORMAL LOW (ref 22–32)
Calcium: 8.3 mg/dL — ABNORMAL LOW (ref 8.9–10.3)
Chloride: 108 mmol/L (ref 98–111)
Creatinine, Ser: 3.04 mg/dL — ABNORMAL HIGH (ref 0.44–1.00)
GFR, Estimated: 17 mL/min — ABNORMAL LOW (ref >60)
Glucose, Bld: 145 mg/dL — ABNORMAL HIGH (ref 70–99)
Potassium: 4.3 mmol/L (ref 3.5–5.1)
Sodium: 138 mmol/L (ref 135–145)

## 2020-08-31 SURGERY — LEFT HEART CATH AND CORONARY ANGIOGRAPHY
Anesthesia: LOCAL

## 2020-08-31 MED ORDER — OXYMETAZOLINE HCL 0.05 % NA SOLN
1.0000 | Freq: Two times a day (BID) | NASAL | Status: DC
  Filled 2020-08-31: qty 30

## 2020-08-31 MED ORDER — LORAZEPAM 0.5 MG PO TABS
0.5000 mg | ORAL_TABLET | Freq: Two times a day (BID) | ORAL | Status: DC | PRN
  Administered 2020-09-01 – 2020-09-02 (×2): 0.5 mg via ORAL
  Filled 2020-08-31 (×2): qty 1

## 2020-08-31 MED ORDER — PROMETHAZINE HCL 25 MG PO TABS: 12.5000 mg | ORAL_TABLET | Freq: Four times a day (QID) | ORAL | Status: DC | PRN

## 2020-08-31 MED ORDER — SODIUM CHLORIDE 0.9 % IV SOLN: INTRAVENOUS | Status: DC

## 2020-08-31 MED ORDER — LORAZEPAM 0.5 MG PO TABS
0.5000 mg | ORAL_TABLET | Freq: Every day | ORAL | Status: DC | PRN
  Administered 2020-08-31: 0.5 mg via ORAL
  Filled 2020-08-31: qty 1

## 2020-08-31 NOTE — Progress Notes (Signed)
Heparin was paused by RN. Pt had an episode of epistaxis. NP aware and Pharmacist.  Will restart heparin. Per orders.  Epistaxis resolved.  Daughter at bedside.   Will continue to monitor.

## 2020-08-31 NOTE — Progress Notes (Signed)
Patient called nurse because she was still nauseated even after giving her zofran iv.  Patient also stated that was coughing and spiting white frothy spit in an emesis bag. Nurse saw emesis bag with white frothy sputum  in bag.   Patient's daughter stated that Zofran iv does not work for pt, but phenergan does.   Amy NP and Dr. [name] aware.  Don't give Afrin at this time and stop heparin gtt, per Dr. [name].   Will continue to monitor.   Nurse made sure heparin gtt was stopped and Afrin removed form bedside.

## 2020-08-31 NOTE — Progress Notes (Signed)
RT NOTE:  Pt has home CPAP at bedside that she manages.

## 2020-08-31 NOTE — Progress Notes (Signed)
ANTICOAGULATION CONSULT NOTE - Follow Up Consult  Pharmacy Consult for IV heparin Indication: chest pain/ACS  Allergies  Allergen Reactions  . Naproxen Hives and Rash  . Celecoxib Other (See Comments)    Stomach pain  . Aspirin Other (See Comments)    CAN NOT TAKE DUE TO ULCERS  . Glucosamine Hives, Swelling and Other (See Comments)    Angioedema   . Metoclopramide Other (See Comments)    Facial twitching and stuttering  . Metolazone Other (See Comments)    Acute renal failure  . Shellfish-Derived Products Hives, Swelling and Other (See Comments)    Angioedema   . Tramadol Nausea And Vomiting         Patient Measurements: Height: 5\' 4"  (162.6 cm) Weight: 114.9 kg (253 lb 4.8 oz) IBW/kg (Calculated) : 54.7 Heparin Dosing Weight: 82.2kg  Vital Signs: Temp: 98.5 F (36.9 C) (02/15 2200) Temp Source: Oral (02/15 2200) BP: 118/62 (02/16 0300) Pulse Rate: 85 (02/16 0500)  Labs: Recent Labs    08/29/20 0636 08/29/20 0636 08/29/20 0853 08/29/20 1700 08/30/20 0006 08/30/20 0950 08/30/20 1814 08/30/20 2032 08/31/20 0410  HGB 9.6*  --   --   --  8.4*  --   --   --  7.8*  HCT 30.8*  --   --   --  27.1*  --   --   --  24.5*  PLT 320  --   --   --  310  --   --   --  296  HEPARINUNFRC  --    < >  --   --  0.11* 0.21* 0.23*  --  0.39  CREATININE 1.87*  --   --   --  2.27*  --   --   --  3.04*  TROPONINIHS 41*  --    < > 1,917*  --   --  1,421* 1,471*  --    < > = values in this interval not displayed.    Estimated Creatinine Clearance: 25.4 mL/min (A) (by C-G formula based on SCr of 3.04 mg/dL (H)).   Medical History: Past Medical History:  Diagnosis Date  . Acquired absence of left great toe ([place])   . Acquired absence of other left toe(s) ([place])   . Anginal pain ([place])   . Atherosclerotic heart disease of native coronary artery without angina pectoris   . CAD (coronary artery disease)    08/2016 DES to RCA, also with history of LAD stenting.   . Chronic  diastolic (congestive) heart failure ([place])   . Chronic kidney disease, stage 2 (mild)   . Chronic systolic (congestive) heart failure ([place])   . Diabetes ([place])   . Diastolic CHF ([place])   . Gastro-esophageal reflux disease without esophagitis   . Hyperlipidemia   . Hypertension   . Hypertensive heart disease with heart failure ([place])   . Hypoxemia   . Idiopathic gout, multiple sites   . Localized edema   . Low back pain   . Mixed hyperlipidemia   . Mixed incontinence   . Morbid (severe) obesity due to excess calories ([place])   . OSA (obstructive sleep apnea)   . Other specified hypothyroidism   . Primary insomnia   . Type 2 diabetes mellitus with diabetic nephropathy (HCC)     Medications:  Infusions:  . sodium chloride    . sodium chloride    . sodium chloride 10 mL/hr at 08/30/20 0539  . sodium chloride    . heparin  1,700 Units/hr (08/30/20 2203)  . nitroGLYCERIN 15 mcg/min (08/30/20 1631)    Assessment: 60 yo female admitted with chest pain. Pt was started on IV heparin without bolus but now with significant elevation in troponin.   Heparin level remains subtherapeutic at 0.23 after rate increase this AM. Hg down to 8.4 today, plt wnl. Noted SCr trend up to 2.27. No bleeding or issues with infusion per discussion with RN.  2/16 AM update:  Heparin level therapeutic after rate increase Small drop in Hgb-watch  Goal of Therapy:  Heparin level 0.3-0.7 units/ml Monitor platelets by anticoagulation protocol: Yes   Plan:  Cont heparin 1700 units/hr 1200 heparin level Watch Hgb  Narda Bonds, PharmD, BCPS Clinical Pharmacist Phone: [phone]

## 2020-08-31 NOTE — Plan of Care (Signed)
Nutrition Education Note  RD consulted for nutrition education regarding new CHF.  Case discussed with RN, who reports pt would benefit from additional diet education.   Spoke with pt and daughter at bedside. Pt weighs herself daily ans has started to be more aware of foods that have sodium. She reports struggling when eating out, as she does not cook with salt. Pt also reports she has been followed by a health coach and RD through her insurance company and has made good progress with these resources.  RD provided "Low Sodium Nutrition Therapy" handout from the Academy of [employer]. Reviewed patient's dietary recall. Provided examples on ways to decrease sodium intake in diet. Discouraged intake of processed foods and use of salt shaker. Encouraged fresh fruits and vegetables as well as whole grain sources of carbohydrates to maximize fiber intake.   RD discussed why it is important for patient to adhere to diet recommendations, and emphasized the role of fluids, foods to avoid, and importance of weighing self daily. Teach back method used.  Expect fair to good compliance.  Current diet order is 2 grams sodium with 1.8 L fluid restriction, patient is consuming approximately 25-100% of meals at this time. Labs and medications reviewed. No further nutrition interventions warranted at this time. RD contact information provided. If additional nutrition issues arise, please re-consult RD.   Loistine Chance, RD, LDN, [place] Registered Dietitian II Certified Diabetes Care and Education Specialist Please refer to [facility] for RD and/or RD on-call/weekend/after hours pager

## 2020-08-31 NOTE — Progress Notes (Addendum)
Patient called RN because she started bleeding again.  Pt is bleeding from right nare and pt states that feels blood draining constantly down her throat.   Bleeding has slow down with cold compress and ice water.   Will continue to monitor.

## 2020-08-31 NOTE — Progress Notes (Addendum)
ANTICOAGULATION CONSULT NOTE - Follow Up Consult  Pharmacy Consult for IV heparin Indication: chest pain/ACS  Allergies  Allergen Reactions  . Naproxen Hives and Rash  . Celecoxib Other (See Comments)    Stomach pain  . Aspirin Other (See Comments)    CAN NOT TAKE DUE TO ULCERS  . Glucosamine Hives, Swelling and Other (See Comments)    Angioedema   . Metoclopramide Other (See Comments)    Facial twitching and stuttering  . Metolazone Other (See Comments)    Acute renal failure  . Shellfish-Derived Products Hives, Swelling and Other (See Comments)    Angioedema   . Tramadol Nausea And Vomiting         Patient Measurements: Height: 5\' 4"  (162.6 cm) Weight: 116.5 kg (256 lb 12.8 oz) IBW/kg (Calculated) : 54.7 Heparin Dosing Weight: 82.2kg  Vital Signs: Temp: 98 F (36.7 C) (02/16 0600) Temp Source: Oral (02/16 0600) BP: 151/65 (02/16 0800) Pulse Rate: 83 (02/16 0800)  Labs: Recent Labs    08/29/20 0636 08/29/20 0853 08/29/20 1700 08/30/20 0006 08/30/20 0950 08/30/20 1814 08/30/20 2032 08/31/20 0410 08/31/20 1216  HGB 9.6*  --   --  8.4*  --   --   --  7.8*  --   HCT 30.8*  --   --  27.1*  --   --   --  24.5*  --   PLT 320  --   --  310  --   --   --  296  --   HEPARINUNFRC  --   --   --  0.11*   < > 0.23*  --  0.39 0.47  CREATININE 1.87*  --   --  2.27*  --   --   --  3.04*  --   TROPONINIHS 41*   < > 1,917*  --   --  1,421* 1,471*  --   --    < > = values in this interval not displayed.    Estimated Creatinine Clearance: 25.6 mL/min (A) (by C-G formula based on SCr of 3.04 mg/dL (H)).   Medical History: Past Medical History:  Diagnosis Date  . Acquired absence of left great toe ([place])   . Acquired absence of other left toe(s) ([place])   . Anginal pain ([place])   . Atherosclerotic heart disease of native coronary artery without angina pectoris   . CAD (coronary artery disease)    08/2016 DES to RCA, also with history of LAD stenting.   . Chronic diastolic  (congestive) heart failure ([place])   . Chronic kidney disease, stage 2 (mild)   . Chronic systolic (congestive) heart failure ([place])   . Diabetes ([place])   . Diastolic CHF ([place])   . Gastro-esophageal reflux disease without esophagitis   . Hyperlipidemia   . Hypertension   . Hypertensive heart disease with heart failure ([place])   . Hypoxemia   . Idiopathic gout, multiple sites   . Localized edema   . Low back pain   . Mixed hyperlipidemia   . Mixed incontinence   . Morbid (severe) obesity due to excess calories ([place])   . OSA (obstructive sleep apnea)   . Other specified hypothyroidism   . Primary insomnia   . Type 2 diabetes mellitus with diabetic nephropathy (HCC)     Medications:  Infusions:  . sodium chloride    . sodium chloride    . sodium chloride 10 mL/hr at 08/30/20 0539  . heparin 1,700 Units/hr (08/31/20 1254)  .  nitroGLYCERIN 15 mcg/min (08/30/20 1631)    Assessment: 60 yo female admitted with chest pain. Pt was started on IV heparin without bolus but now with significant elevation in troponin.   Heparin level now therapeutic at 0.47, H/H down slightly this am, pltc stable.   Goal of Therapy:  Heparin level 0.3-0.7 units/ml Monitor platelets by anticoagulation protocol: Yes   Plan:  Continue heparin 1700 units/hr Daily heparin level and CBC  ADDENDUM: Pt noted to have episode of epistaxis which has now resolved. Will reduce heparin slightly to 1600 units/h.   Arrie Senate, PharmD, BCPS, [facility] Clinical Pharmacist [phone] Please check AMION for all [place] numbers 08/31/2020

## 2020-08-31 NOTE — Plan of Care (Signed)
  Problem: Clinical Measurements: Goal: Respiratory complications will improve Outcome: Progressing   Problem: Coping: Goal: Level of anxiety will decrease Outcome: Progressing   Problem: Safety: Goal: Ability to remain free from injury will improve Outcome: Progressing   

## 2020-08-31 NOTE — Progress Notes (Addendum)
Advanced Heart Failure Rounding Note  PCP-Cardiologist: No primary care provider on file.    Patient Profile   Ms Poppe is 60 year old with history of diastolic heart failure, CAD, DES RCA/LAD 2018, DMI, htn, hyperlipidemia, PAD, and OSA.  Admitted w/ CP and a/c diastolic HF. Ruled in for NSTEMI.   HS-TnI 41 => 66=>612=>1,917  Chest CT negative for PE  Echo w/ preserved LVEF, 55-60%. No RWMAs.   Subjective:    Yesterday started on NTG for chest pain  And continued on heparin drip.    Cath postponed with suspected contrast nephropathy.   No chest pain. Having a hard time sleeping.   Objective:   Weight Range: 116.5 kg Body mass index is 44.08 kg/m.   Vital Signs:   Temp:  [98 F (36.7 C)-98.5 F (36.9 C)] 98 F (36.7 C) (02/16 0600) Pulse Rate:  [67-99] 83 (02/16 0800) Resp:  [13-19] 18 (02/16 0800) BP: (112-157)/(46-77) 151/65 (02/16 0800) SpO2:  [93 %-100 %] 96 % (02/16 0800) Weight:  [116.5 kg] 116.5 kg (02/16 0600) Last BM Date: 08/30/20  Weight change: Filed Weights   08/29/20 1808 08/30/20 0349 08/31/20 0600  Weight: 114.5 kg 114.9 kg 116.5 kg    Intake/Output:   Intake/Output Summary (Last 24 hours) at 08/31/2020 0933 Last data filed at 08/31/2020 0500 Gross per 24 hour  Intake 2428.76 ml  Output 625 ml  Net 1803.76 ml      Physical Exam    General:   No resp difficulty HEENT: normal Neck: supple. Difficult to assess JVD.  Carotids 2+ bilat; no bruits. No lymphadenopathy or thryomegaly appreciated. Cor: PMI nondisplaced. Regular rate & rhythm. No rubs, gallops or murmurs. Lungs: clear Abdomen: obese, soft, nontender, nondistended. No hepatosplenomegaly. No bruits or masses. Good bowel sounds. Extremities: no cyanosis, clubbing, rash, R and LLE trace edema, compression hose.  Neuro: alert & orientedx3, cranial nerves grossly intact. moves all 4 extremities w/o difficulty. Affect pleasant   Telemetry   NSR 80-90s personally reviewed.    EKG   2/15 SR 80 bpm    Labs    CBC Recent Labs    08/30/20 0006 08/31/20 0410  WBC 13.8* 13.9*  HGB 8.4* 7.8*  HCT 27.1* 24.5*  MCV 83.6 83.3  PLT 310 211   Basic Metabolic Panel Recent Labs    08/30/20 0006 08/31/20 0410  NA 135 138  K 4.7 4.3  CL 104 108  CO2 19* 18*  GLUCOSE 292* 145*  BUN 43* 51*  CREATININE 2.27* 3.04*  CALCIUM 8.2* 8.3*   Liver Function Tests No results for input(s): AST, ALT, ALKPHOS, BILITOT, PROT, ALBUMIN in the last 72 hours. No results for input(s): LIPASE, AMYLASE in the last 72 hours. Cardiac Enzymes No results for input(s): CKTOTAL, CKMB, CKMBINDEX, TROPONINI in the last 72 hours.  BNP: BNP (last 3 results) Recent Labs    08/29/20 0636  BNP 87.6    ProBNP (last 3 results) No results for input(s): PROBNP in the last 8760 hours.   D-Dimer No results for input(s): DDIMER in the last 72 hours. Hemoglobin A1C Recent Labs    08/30/20 0006  HGBA1C 6.1*   Fasting Lipid Panel No results for input(s): CHOL, HDL, LDLCALC, TRIG, CHOLHDL, LDLDIRECT in the last 72 hours. Thyroid Function Tests No results for input(s): TSH, T4TOTAL, T3FREE, THYROIDAB in the last 72 hours.  Invalid input(s): FREET3  Other results:   Imaging    No results found.   Medications:  Scheduled Medications: . aspirin EC  81 mg Oral Daily  . carvedilol  12.5 mg Oral BID WC  . clopidogrel  75 mg Oral Daily  . ezetimibe  10 mg Oral Daily  . insulin aspart  0-15 Units Subcutaneous TID WC  . insulin detemir  40 Units Subcutaneous Daily  . pantoprazole  40 mg Oral Daily  . sodium chloride flush  3 mL Intravenous Q12H  . sodium chloride flush  3 mL Intravenous Q12H    Infusions: . sodium chloride    . sodium chloride    . sodium chloride 10 mL/hr at 08/30/20 0539  . heparin 1,700 Units/hr (08/30/20 2203)  . nitroGLYCERIN 15 mcg/min (08/30/20 1631)    PRN Medications: sodium chloride, sodium chloride, acetaminophen,  HYDROcodone-acetaminophen, LORazepam, nitroGLYCERIN, ondansetron (ZOFRAN) IV, sodium chloride flush, sodium chloride flush     Assessment/Plan   1. NSTEMI/ CAD:  - H/o DES RCA/LAD in 2018 - HS-TnI 41 => 66=>612=>1,917 +1421=>1471  - Chest CT negative for PE - 2D echo normal EF 55-60%. No RWMAs. RV normal. No effusion  - Needs LHC but will postpone given AKI w/ SCr up to 3. Will reschedule once creatinine comes back down.  - Continue IV heparin +  nitro gtt  - ASA 81 + Plavix 75  - Coreg 12.5 mg bid  - on Repatha for HLD   2. Acute on Chronic Diastolic Heart Failure - Suspect in setting of dietary indiscretion/salt load.  - Vascular congestion on chest imaging - BNP only 87 but likely falsely low in setting of obesity  - no current dyspnea at rest - hold additional IV Lasix for now given AKI and need for LHC  - Hold Farxiga and spiro  - Continue Coreg 12.5 mg bid   3. Suspect Contrast Nephropathy , AKI on Stage III CKD  - baseline SCr ~1.4 - 1.87 on admit, up to 3 today  - got contrast w/ chest CTA, likely contrast nephropathy  -Given IV hydration.  Stop IV fluids.  - Hold diuretics and home Farxiga and spiro  - avoid hypotension   4. HLD - on Repatha as outpatient, followed by Dr. Debara Pickett   5. OSA - Continue CPAP nightly order CPAP qhs   6. Type 2 DM  - last Hgb A1c 12/21 6.7 - SSI  - Levemir 40 units added - Diabetes Coordinator recs appreciated.   Restart home ativan as needed.   Length of Stay: 1  Amy Clegg, NP  08/31/2020, 9:33 AM  Advanced Heart Failure Team Pager [phone] (M-F; 7a - 4p)  Please contact [facility] for night-coverage after hours (4p -7a ) and weekends on [email or web address]  Patient seen and examined with the above-signed Advanced Practice Provider and/or Housestaff. I personally reviewed laboratory data, imaging studies and relevant notes. I independently examined the patient and formulated the important aspects of the plan. I have edited the  note to reflect any of my changes or salient points. I have personally discussed the plan with the patient and/or family.  Remains on IV heparin and NTG. CP improving. Troponin trending down. CP improved. Hydrated yesterday by creatinine up to 3.0 today  General:  Sitting up in bed . No resp difficulty HEENT: normal Neck: supple. no JVD. Carotids 2+ bilat; no bruits. No lymphadenopathy or thryomegaly appreciated. Cor: PMI nondisplaced. Regular rate & rhythm. No rubs, gallops or murmurs. Lungs: clear Abdomen: obese soft, nontender, nondistended. No hepatosplenomegaly. No bruits or masses. Good bowel sounds. Extremities: no cyanosis, clubbing,  rash, edema Neuro: alert & orientedx3, cranial nerves grossly intact. moves all 4 extremities w/o difficulty. Affect pleasant  CP improved. Continue heparin. Can wean NTG as tolerated. Cath cancelled due to AKI. Suspect. CIN +/- ATN. May need to manage NSTEMI medically for a while. Watch volume status closely. Daily BMETs to watch renal function. Hopefully turns around soon and won't require HD.   [name], MD  2:22 PM

## 2020-08-31 NOTE — Progress Notes (Signed)
Called by staff nurse for recurrent nose bleed  Nose bleed had stopped but started again after she was crying. Can ativan twice a day.   Not currently bleeding. Asking for pain medication.    Sandralee Tarkington NP-C  5:53 PM

## 2020-08-31 NOTE — Progress Notes (Signed)
  Called by nursing staff for nose bleed.  [place] say she blew her nose and this caused her nose to bleed. Heparin was paused.   Nose bleed resolved spontaneously.   Restart heparin drip.    Jamilah Jean Np-C  2:34 PM

## 2020-09-01 DIAGNOSIS — I214 Non-ST elevation (NSTEMI) myocardial infarction: Secondary | ICD-10-CM | POA: Diagnosis not present

## 2020-09-01 DIAGNOSIS — I5033 Acute on chronic diastolic (congestive) heart failure: Secondary | ICD-10-CM | POA: Diagnosis not present

## 2020-09-01 DIAGNOSIS — N179 Acute kidney failure, unspecified: Secondary | ICD-10-CM | POA: Diagnosis not present

## 2020-09-01 LAB — URINALYSIS, ROUTINE W REFLEX MICROSCOPIC
Bilirubin Urine: NEGATIVE
Glucose, UA: 50 mg/dL — AB
Hgb urine dipstick: NEGATIVE
Ketones, ur: NEGATIVE mg/dL
Leukocytes,Ua: NEGATIVE
Nitrite: NEGATIVE
Protein, ur: 100 mg/dL — AB
Specific Gravity, Urine: 1.009 (ref 1.005–1.030)
pH: 5 (ref 5.0–8.0)

## 2020-09-01 LAB — GLUCOSE, CAPILLARY
Glucose-Capillary: 132 mg/dL — ABNORMAL HIGH (ref 70–99)
Glucose-Capillary: 211 mg/dL — ABNORMAL HIGH (ref 70–99)
Glucose-Capillary: 214 mg/dL — ABNORMAL HIGH (ref 70–99)
Glucose-Capillary: 125 mg/dL — ABNORMAL HIGH (ref 70–99)

## 2020-09-01 LAB — IRON AND TIBC
Iron: 29 ug/dL (ref 28–170)
Saturation Ratios: 9 % — ABNORMAL LOW (ref 10.4–31.8)
TIBC: 323 ug/dL (ref 250–450)
UIBC: 294 ug/dL

## 2020-09-01 LAB — BASIC METABOLIC PANEL
Anion gap: 11 (ref 5–15)
BUN: 53 mg/dL — ABNORMAL HIGH (ref 6–20)
CO2: 19 mmol/L — ABNORMAL LOW (ref 22–32)
Calcium: 8.4 mg/dL — ABNORMAL LOW (ref 8.9–10.3)
Chloride: 108 mmol/L (ref 98–111)
Creatinine, Ser: 3.2 mg/dL — ABNORMAL HIGH (ref 0.44–1.00)
GFR, Estimated: 16 mL/min — ABNORMAL LOW (ref >60)
Glucose, Bld: 168 mg/dL — ABNORMAL HIGH (ref 70–99)
Potassium: 4.2 mmol/L (ref 3.5–5.1)
Sodium: 138 mmol/L (ref 135–145)

## 2020-09-01 LAB — CBC
HCT: 23.8 % — ABNORMAL LOW (ref 36.0–46.0)
Hemoglobin: 7.3 g/dL — ABNORMAL LOW (ref 12.0–15.0)
MCH: 25.8 pg — ABNORMAL LOW (ref 26.0–34.0)
MCHC: 30.7 g/dL (ref 30.0–36.0)
MCV: 84.1 fL (ref 80.0–100.0)
Platelets: 275 10*3/uL (ref 150–400)
RBC: 2.83 MIL/uL — ABNORMAL LOW (ref 3.87–5.11)
RDW: 15.6 % — ABNORMAL HIGH (ref 11.5–15.5)
WBC: 14.7 10*3/uL — ABNORMAL HIGH (ref 4.0–10.5)
nRBC: 0 % (ref 0.0–0.2)

## 2020-09-01 LAB — PREPARE RBC (CROSSMATCH)

## 2020-09-01 LAB — OCCULT BLOOD X 1 CARD TO LAB, STOOL: Fecal Occult Bld: NEGATIVE

## 2020-09-01 LAB — ABO/RH: ABO/RH(D): A NEG

## 2020-09-01 LAB — FERRITIN: Ferritin: 64 ng/mL (ref 11–307)

## 2020-09-01 LAB — HEPARIN LEVEL (UNFRACTIONATED): Heparin Unfractionated: <0.1 IU/mL — ABNORMAL LOW (ref 0.30–0.70)

## 2020-09-01 MED ORDER — SODIUM CHLORIDE 0.9% IV SOLUTION
Freq: Once | INTRAVENOUS | Status: AC
Start: 2020-09-01 — End: 2020-09-01

## 2020-09-01 MED ORDER — FUROSEMIDE 10 MG/ML IJ SOLN
80.0000 mg | Freq: Once | INTRAMUSCULAR | Status: AC
  Administered 2020-09-01: 80 mg via INTRAVENOUS
  Filled 2020-09-01: qty 8

## 2020-09-01 MED ORDER — SODIUM CHLORIDE 0.9% IV SOLUTION: Freq: Once | INTRAVENOUS | Status: AC

## 2020-09-01 NOTE — Progress Notes (Addendum)
Advanced Heart Failure Rounding Note  PCP-Cardiologist: No primary care provider on file.    Patient Profile   Dana [name] is 60 year old with history of diastolic heart failure, CAD, DES RCA/LAD 2018, DMI, htn, hyperlipidemia, PAD, and OSA.  Admitted w/ CP and a/c diastolic HF. Ruled in for NSTEMI.   HS-TnI 41 => 66=>612=>1,917  Chest CT negative for PE  Echo w/ preserved LVEF, 55-60%. No RWMAs.   Subjective:    Cath postponed again due to AKI with suspected contrast nephropathy.   SCr continues to trend up 1.87>>2.27>>3.04>>3.20  UOP decreasing, only 650 cc charted yesterday.   K 4.2 CO2 low at 19   WBC trending up slowly, 12>>14>>15K. AF. No fever, chills, cough of dysuria.   Complains of fatigue.   Hgb down from 10 on admit to 7.3 today. Had nosebleed yesterday that lasted ~1 hr. No melena/ hematochezia. Heparin stopped   Currently CP free on nitro gtt. No dyspnea.     Objective:   Weight Range: 116.1 kg Body mass index is 43.94 kg/m.   Vital Signs:   Temp:  [97.9 F (36.6 C)-98.5 F (36.9 C)] 98.4 F (36.9 C) (02/17 0404) Pulse Rate:  [66-84] 80 (02/17 0800) Resp:  [13-20] 20 (02/17 0404) BP: (117-152)/(45-63) 152/53 (02/17 0800) SpO2:  [92 %-100 %] 93 % (02/17 0404) Weight:  [116.1 kg] 116.1 kg (02/17 0404) Last BM Date: 08/29/20  Weight change: Filed Weights   08/30/20 0349 08/31/20 0600 09/01/20 0404  Weight: 114.9 kg 116.5 kg 116.1 kg    Intake/Output:   Intake/Output Summary (Last 24 hours) at 09/01/2020 0821 Last data filed at 08/31/2020 2300 Gross per 24 hour  Intake 840 ml  Output 650 ml  Net 190 ml      Physical Exam    General:   Fatigue appearing WF at sink bathing. No respiratory difficulty  HEENT: normal Neck: supple. Thick neck JVD hard to visualize.  Carotids 2+ bilat; no bruits. No lymphadenopathy or thryomegaly appreciated. Cor: PMI nondisplaced. Regular rate & rhythm. No rubs, gallops or murmurs. Lungs:  clear Abdomen: obese, soft, nontender, nondistended. No hepatosplenomegaly. No bruits or masses. Good bowel sounds. Extremities: no cyanosis, clubbing, rash. Obese LEs. + compression stockings  Neuro: alert & orientedx3, cranial nerves grossly intact. moves all 4 extremities w/o difficulty. Affect pleasant   Telemetry   NSR 80-90s personally reviewed.   EKG   No new EKG to review   Labs    CBC Recent Labs    08/31/20 0410 09/01/20 0142  WBC 13.9* 14.7*  HGB 7.8* 7.3*  HCT 24.5* 23.8*  MCV 83.3 84.1  PLT 296 935   Basic Metabolic Panel Recent Labs    08/31/20 0410 09/01/20 0142  NA 138 138  K 4.3 4.2  CL 108 108  CO2 18* 19*  GLUCOSE 145* 168*  BUN 51* 53*  CREATININE 3.04* 3.20*  CALCIUM 8.3* 8.4*   Liver Function Tests No results for input(s): AST, ALT, ALKPHOS, BILITOT, PROT, ALBUMIN in the last 72 hours. No results for input(s): LIPASE, AMYLASE in the last 72 hours. Cardiac Enzymes No results for input(s): CKTOTAL, CKMB, CKMBINDEX, TROPONINI in the last 72 hours.  BNP: BNP (last 3 results) Recent Labs    08/29/20 0636  BNP 87.6    ProBNP (last 3 results) No results for input(s): PROBNP in the last 8760 hours.   D-Dimer No results for input(s): DDIMER in the last 72 hours. Hemoglobin A1C Recent Labs  08/30/20 0006  HGBA1C 6.1*   Fasting Lipid Panel No results for input(s): CHOL, HDL, LDLCALC, TRIG, CHOLHDL, LDLDIRECT in the last 72 hours. Thyroid Function Tests No results for input(s): TSH, T4TOTAL, T3FREE, THYROIDAB in the last 72 hours.  Invalid input(s): FREET3  Other results:   Imaging    No results found.   Medications:     Scheduled Medications: . aspirin EC  81 mg Oral Daily  . carvedilol  12.5 mg Oral BID WC  . clopidogrel  75 mg Oral Daily  . ezetimibe  10 mg Oral Daily  . insulin aspart  0-15 Units Subcutaneous TID WC  . insulin detemir  40 Units Subcutaneous Daily  . pantoprazole  40 mg Oral Daily  . sodium  chloride flush  3 mL Intravenous Q12H  . sodium chloride flush  3 mL Intravenous Q12H    Infusions: . sodium chloride    . sodium chloride    . sodium chloride 10 mL/hr at 08/30/20 0539  . nitroGLYCERIN 15 mcg/min (08/30/20 1631)    PRN Medications: sodium chloride, sodium chloride, acetaminophen, HYDROcodone-acetaminophen, LORazepam, nitroGLYCERIN, ondansetron (ZOFRAN) IV, sodium chloride flush, sodium chloride flush     Assessment/Plan   1. NSTEMI/ CAD:  - H/o DES RCA/LAD in 2018 - HS-TnI 41 => 66=>612=>1,917 +1421=>1471  - Chest CT negative for PE - 2D echo normal EF 55-60%. No RWMAs. RV normal. No effusion  - Needs LHC but will postpone given AKI w/ SCr up to 3.2 Will reschedule once creatinine comes back down.  - for now continue medical management. She is currently CP free  - Continue IV heparin +  nitro gtt  - ASA 81 + Plavix 75  - Coreg 12.5 mg bid  - on Repatha for HLD   2. Acute on Chronic Diastolic Heart Failure - Suspect in setting of dietary indiscretion/salt load.  - Vascular congestion on chest imaging - BNP only 87 but likely falsely low in setting of obesity  - no current dyspnea at rest - hold additional IV Lasix for now given AKI and need for LHC  - Hold Farxiga and spiro  - Continue Coreg 12.5 mg bid   3. Suspect Contrast Nephropathy , AKI on Stage III CKD  - baseline SCr ~1.4 - 1.87 on admit, up to 3.2 today  - got contrast w/ chest CTA, likely contrast nephropathy  - UOP decreasing but remains nonoliguric. Continue strict I/Os  - Given IV hydration.   Now off IVFs to prevent volume overload w/ dHF.  - Hold diuretics and home Farxiga and spiro  - avoid hypotension  - ? Component of asymptomatic UTI w/ leukocytosis. Check UA   4. HLD - on Repatha as outpatient, followed by Dr. Debara Pickett   5. OSA - Continue CPAP nightly order CPAP qhs   6. Type 2 DM  - last Hgb A1c 12/21 6.7 - SSI  - Levemir 40 units added - Diabetes Coordinator recs  appreciated.   7. Anemia - Hgb down from 9.6 on admit to 7.3 today  - had nosebleed x 1 yesterday. No melena/ hematochezia - T&S and transfuse x 1 unit RBCs - FOBT  - Check Iron studies. May need feraheme   Length of Stay: 2  Dana [name], Dana [name]  09/01/2020, 8:21 AM  Advanced Heart Failure Team Pager [phone] (M-F; 7a - 4p)  Please contact [facility] for night-coverage after hours (4p -7a ) and weekends on [email or web address]   Patient seen and examined with the above-signed  Advanced Practice Provider and/or Housestaff. I personally reviewed laboratory data, imaging studies and relevant notes. I independently examined the patient and formulated the important aspects of the plan. I have edited the note to reflect any of my changes or salient points. I have personally discussed the plan with the patient and/or family.  On IV NTG. CP free. Heparin now off and epistaxis resolved. HGb down to  7.3. Creatinine still trending up but starting to plateau. Denies SOB. Weight stable   General:  Fatigued appearing. No resp difficulty HEENT: normal Neck: supple. JVP hard to see Carotids 2+ bilat; no bruits. No lymphadenopathy or thryomegaly appreciated. Cor: PMI nondisplaced. Regular rate & rhythm. No rubs, gallops or murmurs. Lungs: clear Abdomen: obese soft, nontender, nondistended. No hepatosplenomegaly. No bruits or masses. Good bowel sounds. Extremities: no cyanosis, clubbing, rash, 1+ edema + compression hose  Neuro: alert & orientedx3, cranial nerves grossly intact. moves all 4 extremities w/o difficulty. Affect pleasant  CP resolved. Epistaxis improved with stopping heparin. AKI seems to be plateauing. Appears volume overloaded. Will give 1 does IV lasix and assess response. If making decent urine will transfuse 1uRBCs.  Wean NTG as tolerated. No cath for now.   [name], MD  9:29 AM

## 2020-09-01 NOTE — Plan of Care (Signed)
  Problem: Activity: °Goal: Capacity to carry out activities will improve °Outcome: Progressing °  °Problem: Pain Managment: °Goal: General experience of comfort will improve °Outcome: Progressing °  °Problem: Safety: °Goal: Ability to remain free from injury will improve °Outcome: Progressing °  °

## 2020-09-02 DIAGNOSIS — I214 Non-ST elevation (NSTEMI) myocardial infarction: Secondary | ICD-10-CM | POA: Diagnosis not present

## 2020-09-02 DIAGNOSIS — N179 Acute kidney failure, unspecified: Secondary | ICD-10-CM | POA: Diagnosis not present

## 2020-09-02 DIAGNOSIS — I5033 Acute on chronic diastolic (congestive) heart failure: Secondary | ICD-10-CM | POA: Diagnosis not present

## 2020-09-02 LAB — BASIC METABOLIC PANEL
Anion gap: 12 (ref 5–15)
BUN: 54 mg/dL — ABNORMAL HIGH (ref 6–20)
CO2: 21 mmol/L — ABNORMAL LOW (ref 22–32)
Calcium: 8.5 mg/dL — ABNORMAL LOW (ref 8.9–10.3)
Chloride: 106 mmol/L (ref 98–111)
Creatinine, Ser: 3.36 mg/dL — ABNORMAL HIGH (ref 0.44–1.00)
GFR, Estimated: 15 mL/min — ABNORMAL LOW (ref >60)
Glucose, Bld: 103 mg/dL — ABNORMAL HIGH (ref 70–99)
Potassium: 4.3 mmol/L (ref 3.5–5.1)
Sodium: 139 mmol/L (ref 135–145)

## 2020-09-02 LAB — TYPE AND SCREEN
ABO/RH(D): A NEG
Antibody Screen: NEGATIVE
Unit division: 0

## 2020-09-02 LAB — CBC
HCT: 26.4 % — ABNORMAL LOW (ref 36.0–46.0)
Hemoglobin: 8.1 g/dL — ABNORMAL LOW (ref 12.0–15.0)
MCH: 25.2 pg — ABNORMAL LOW (ref 26.0–34.0)
MCHC: 30.7 g/dL (ref 30.0–36.0)
MCV: 82.2 fL (ref 80.0–100.0)
Platelets: 267 10*3/uL (ref 150–400)
RBC: 3.21 MIL/uL — ABNORMAL LOW (ref 3.87–5.11)
RDW: 16.1 % — ABNORMAL HIGH (ref 11.5–15.5)
WBC: 13.2 10*3/uL — ABNORMAL HIGH (ref 4.0–10.5)
nRBC: 0 % (ref 0.0–0.2)

## 2020-09-02 LAB — BPAM RBC
Blood Product Expiration Date: 202202262359
ISSUE DATE / TIME: 202202171549
Unit Type and Rh: 600

## 2020-09-02 LAB — GLUCOSE, CAPILLARY
Glucose-Capillary: 123 mg/dL — ABNORMAL HIGH (ref 70–99)
Glucose-Capillary: 174 mg/dL — ABNORMAL HIGH (ref 70–99)
Glucose-Capillary: 254 mg/dL — ABNORMAL HIGH (ref 70–99)
Glucose-Capillary: 137 mg/dL — ABNORMAL HIGH (ref 70–99)

## 2020-09-02 LAB — URINE CULTURE: Culture: <10000 — AB

## 2020-09-02 MED ORDER — DIPHENHYDRAMINE HCL 25 MG PO CAPS
25.0000 mg | ORAL_CAPSULE | Freq: Four times a day (QID) | ORAL | Status: DC | PRN
  Administered 2020-09-02 (×2): 25 mg via ORAL
  Filled 2020-09-02 (×2): qty 1

## 2020-09-02 MED ORDER — SODIUM CHLORIDE 0.9 % IV SOLN
510.0000 mg | Freq: Once | INTRAVENOUS | Status: AC
  Administered 2020-09-02: 510 mg via INTRAVENOUS
  Filled 2020-09-02: qty 17

## 2020-09-02 MED ORDER — ENOXAPARIN SODIUM 30 MG/0.3ML ~~LOC~~ SOLN
30.0000 mg | SUBCUTANEOUS | Status: DC
  Administered 2020-09-02: 30 mg via SUBCUTANEOUS
  Filled 2020-09-02: qty 0.3

## 2020-09-02 NOTE — Progress Notes (Addendum)
Advanced Heart Failure Rounding Note  PCP-Cardiologist: No primary care provider on file.    Patient Profile   Dana [name] is 60 year old with history of diastolic heart failure, CAD, DES RCA/LAD 2018, DMI, htn, hyperlipidemia, PAD, and OSA.  Admitted w/ CP and a/c diastolic HF. Ruled in for NSTEMI.   HS-TnI 41 => 66=>612=>1,917  Chest CT negative for PE  Echo w/ preserved LVEF, 55-60%. No RWMAs.   Subjective:    Cath postponed due to AKI with suspected contrast nephropathy.   Off heparin w/ GIB and transfusion needs, Hgb up 7.3>>8.1 after 1U trans 2/17. FOBT negative. No further Bleeding. CP free on Nitro gtt.   SCr continues to trend up 1.87>>2.27>>3.04>>3.20>>3.36  Making urine. -2.2 L out yesterday w/ IV Lasix. Wt down 3 lb.   Still feels SOB and + dry nocturnal cough   Objective:   Weight Range: 114.9 kg Body mass index is 43.48 kg/m.   Vital Signs:   Temp:  [97.5 F (36.4 C)-98.3 F (36.8 C)] 97.7 F (36.5 C) (02/18 0400) Pulse Rate:  [53-71] 70 (02/18 0833) Resp:  [12-20] 16 (02/18 0400) BP: (114-150)/(50-68) 150/61 (02/18 0833) SpO2:  [91 %-99 %] 97 % (02/18 0401) Weight:  [114.9 kg] 114.9 kg (02/18 0401) Last BM Date: 08/31/20  Weight change: Filed Weights   08/31/20 0600 09/01/20 0404 09/02/20 0401  Weight: 116.5 kg 116.1 kg 114.9 kg    Intake/Output:   Intake/Output Summary (Last 24 hours) at 09/02/2020 1013 Last data filed at 09/02/2020 0300 Gross per 24 hour  Intake 1817.43 ml  Output 2150 ml  Net -332.57 ml      Physical Exam   PHYSICAL EXAM: General:  Fatigue appearing, obese. No respiratory difficulty HEENT: normal Neck: supple. Thick neck JVD not well visualized. Carotids 2+ bilat; no bruits. No lymphadenopathy or thyromegaly appreciated. Cor: PMI nondisplaced. Regular rate & rhythm. No rubs, gallops or murmurs. Lungs: decreased BS at the bases  Abdomen: obese, soft, nontender, nondistended. No hepatosplenomegaly. No bruits or  masses. Good bowel sounds. Extremities: no cyanosis, clubbing, rash, obese LEs + compression hoses  Neuro: alert & oriented x 3, cranial nerves grossly intact. moves all 4 extremities w/o difficulty. Affect pleasant.    Telemetry   NSR 80-90s personally reviewed.   EKG   No new EKG to review   Labs    CBC Recent Labs    09/01/20 0142 09/02/20 0310  WBC 14.7* 13.2*  HGB 7.3* 8.1*  HCT 23.8* 26.4*  MCV 84.1 82.2  PLT 275 193   Basic Metabolic Panel Recent Labs    09/01/20 0142 09/02/20 0310  NA 138 139  K 4.2 4.3  CL 108 106  CO2 19* 21*  GLUCOSE 168* 103*  BUN 53* 54*  CREATININE 3.20* 3.36*  CALCIUM 8.4* 8.5*   Liver Function Tests No results for input(s): AST, ALT, ALKPHOS, BILITOT, PROT, ALBUMIN in the last 72 hours. No results for input(s): LIPASE, AMYLASE in the last 72 hours. Cardiac Enzymes No results for input(s): CKTOTAL, CKMB, CKMBINDEX, TROPONINI in the last 72 hours.  BNP: BNP (last 3 results) Recent Labs    08/29/20 0636  BNP 87.6    ProBNP (last 3 results) No results for input(s): PROBNP in the last 8760 hours.   D-Dimer No results for input(s): DDIMER in the last 72 hours. Hemoglobin A1C No results for input(s): HGBA1C in the last 72 hours. Fasting Lipid Panel No results for input(s): CHOL, HDL, LDLCALC, TRIG,  CHOLHDL, LDLDIRECT in the last 72 hours. Thyroid Function Tests No results for input(s): TSH, T4TOTAL, T3FREE, THYROIDAB in the last 72 hours.  Invalid input(s): FREET3  Other results:   Imaging    No results found.   Medications:     Scheduled Medications: . aspirin EC  81 mg Oral Daily  . carvedilol  12.5 mg Oral BID WC  . clopidogrel  75 mg Oral Daily  . ezetimibe  10 mg Oral Daily  . insulin aspart  0-15 Units Subcutaneous TID WC  . insulin detemir  40 Units Subcutaneous Daily  . pantoprazole  40 mg Oral Daily  . sodium chloride flush  3 mL Intravenous Q12H  . sodium chloride flush  3 mL Intravenous  Q12H    Infusions: . sodium chloride    . sodium chloride    . sodium chloride 10 mL/hr at 08/30/20 0539  . nitroGLYCERIN 20 mcg/min (09/02/20 0839)    PRN Medications: sodium chloride, sodium chloride, acetaminophen, HYDROcodone-acetaminophen, LORazepam, nitroGLYCERIN, ondansetron (ZOFRAN) IV, sodium chloride flush, sodium chloride flush     Assessment/Plan   1. NSTEMI/ CAD:  - H/o DES RCA/LAD in 2018 - HS-TnI 41 => 66=>612=>1,917 +1421=>1471  - Chest CT negative for PE - 2D echo normal EF 55-60%. No RWMAs. RV normal. No effusion  - Needs LHC but will postpone given AKI w/ SCr up to 3.4. Will reschedule once creatinine comes back down.  - for now continue medical management. She is currently CP free  - Continue  nitro gtt  - off heparin w/ noes bleeds  - ASA 81 + Plavix 75  - Coreg 12.5 mg bid  - on Repatha for HLD   2. Acute on Chronic Diastolic Heart Failure - Suspect in setting of dietary indiscretion/salt load.  - Vascular congestion on chest imaging - BNP only 87 but likely falsely low in setting of obesity  - Hold Farxiga and spiro w/ AKI  - Continue Coreg 12.5 mg bid  - volume assessment difficult due to body habitus but suspect she is fluid overloaded based on symptoms. Will discuss diuretic dosing w/ Dr. [name]   3. Suspect Contrast Nephropathy , AKI on Stage III CKD  - baseline SCr ~1.4 - 1.87 on admit. Still trending up, now at 3.4 today  - got contrast w/ chest CTA, likely contrast nephropathy  - Given IV hydration>>now fluid overloaded - Give additional IV Lasix today and follow BMP   - Continue to hold Farxiga and spiro  - avoid hypotension  - UA negative   4. HLD - on Repatha as outpatient, followed by Dr. Debara Pickett   5. OSA - Continue CPAP nightly order CPAP qhs   6. Type 2 DM  - last Hgb A1c 12/21 6.7 - SSI  - Levemir 40 units added - Diabetes Coordinator recs appreciated.   7. Anemia - acute blood loss and iron deficient anemia - hgb  dropped from 9.6 on admit to 7.3 - had nosebleed x 1 this admit. No melena/ hematochezia - Transfused x 1 unit 2/17. Hgb 7.3>>8.1  - FOBT negative  - Iron 29, Sats ratio 9%.  Give feraheme   Length of Stay: [address], PA-C  09/02/2020, 10:13 AM  Advanced Heart Failure Team Pager [phone] (M-F; [place])  Please contact [facility] for night-coverage after hours (4p -7a ) and weekends on [email or web address]  Patient seen and examined with the above-signed Advanced Practice Provider and/or Housestaff. I personally reviewed laboratory data, imaging  studies and relevant notes. I independently examined the patient and formulated the important aspects of the plan. I have edited the note to reflect any of my changes or salient points. I have personally discussed the plan with the patient and/or family.  Looks better today. Diuresed well yesterday. Creatinine up slightly again. Denies CP. Hgb improved after transfusion.   General:  Sitting in chair No resp difficulty HEENT: normal Neck: supple. JVP hard to see. Carotids 2+ bilat; no bruits. No lymphadenopathy or thryomegaly appreciated. Cor: PMI nondisplaced. Regular rate & rhythm. No rubs, gallops or murmurs. Lungs: clear Abdomen: obese soft, nontender, nondistended. No hepatosplenomegaly. No bruits or masses. Good bowel sounds. Extremities: no cyanosis, clubbing, rash, edema Neuro: alert & orientedx3, cranial nerves grossly intact. moves all 4 extremities w/o difficulty. Affect pleasant  She is improved. No further CP. Can stop IV NTG. Would hold diuretics today. Can check REDS as needed. Follow renal function closely. Start lovenox for DVT prophylaxis. Given severity of AKI may be best to forego cath for several weeks  Unless she has recurrent angina.   [name], MD  12:51 PM

## 2020-09-02 NOTE — Plan of Care (Signed)
  Problem: Activity: Goal: Capacity to carry out activities will improve Outcome: Progressing   Problem: Activity: Goal: Risk for activity intolerance will decrease Outcome: Progressing   Problem: Elimination: Goal: Will not experience complications related to urinary retention Outcome: Progressing

## 2020-09-02 NOTE — Significant Event (Signed)
IV red and tender. IV dc'd and nitro drip placed on hold. IV team consulted for new insertion. MD notified.

## 2020-09-02 NOTE — Progress Notes (Signed)
   Notified by RN that patient complaining of itching this evening. Mostly scalp itching, though some redness to forehead observed. No obvious hives. No wheezing, SOB, or lip/tongue swelling. Suspect this could be due to the feraheme she received this afternoon which is the only new medication. Will order prn benadryl for management of possible medication reaction. Continue to monitor closely for progression of reaction.    Abigail Butts, PA-C 09/02/20; 7:45 PM

## 2020-09-02 NOTE — Care Management Important Message (Signed)
Important Message  Patient Details  Name: Dana [name] MRN: [id number] Date of Birth: [birth year]   Medicare Important Message Given:  Yes     [name] 09/02/2020, 8:37 AM

## 2020-09-03 ENCOUNTER — Encounter (HOSPITAL_COMMUNITY): Payer: Self-pay | Admitting: Cardiology

## 2020-09-03 DIAGNOSIS — G4733 Obstructive sleep apnea (adult) (pediatric): Secondary | ICD-10-CM | POA: Diagnosis present

## 2020-09-03 DIAGNOSIS — I214 Non-ST elevation (NSTEMI) myocardial infarction: Secondary | ICD-10-CM

## 2020-09-03 DIAGNOSIS — Z9989 Dependence on other enabling machines and devices: Secondary | ICD-10-CM | POA: Diagnosis present

## 2020-09-03 DIAGNOSIS — N141 Nephropathy induced by other drugs, medicaments and biological substances: Secondary | ICD-10-CM

## 2020-09-03 DIAGNOSIS — E118 Type 2 diabetes mellitus with unspecified complications: Secondary | ICD-10-CM | POA: Diagnosis present

## 2020-09-03 DIAGNOSIS — R079 Chest pain, unspecified: Secondary | ICD-10-CM | POA: Diagnosis not present

## 2020-09-03 DIAGNOSIS — I5033 Acute on chronic diastolic (congestive) heart failure: Secondary | ICD-10-CM | POA: Diagnosis not present

## 2020-09-03 DIAGNOSIS — N1411 Contrast-induced nephropathy: Secondary | ICD-10-CM

## 2020-09-03 DIAGNOSIS — T508X5A Adverse effect of diagnostic agents, initial encounter: Secondary | ICD-10-CM

## 2020-09-03 DIAGNOSIS — N179 Acute kidney failure, unspecified: Secondary | ICD-10-CM | POA: Diagnosis not present

## 2020-09-03 DIAGNOSIS — Z794 Long term (current) use of insulin: Secondary | ICD-10-CM | POA: Diagnosis present

## 2020-09-03 HISTORY — DX: Type 2 diabetes mellitus with unspecified complications: E11.8

## 2020-09-03 HISTORY — DX: Obstructive sleep apnea (adult) (pediatric): G47.33

## 2020-09-03 HISTORY — DX: Nephropathy induced by other drugs, medicaments and biological substances: N14.1

## 2020-09-03 HISTORY — DX: Contrast-induced nephropathy: N14.11

## 2020-09-03 HISTORY — DX: Non-ST elevation (NSTEMI) myocardial infarction: I21.4

## 2020-09-03 HISTORY — DX: Adverse effect of diagnostic agents, initial encounter: T50.8X5A

## 2020-09-03 LAB — CBC
HCT: 25.8 % — ABNORMAL LOW (ref 36.0–46.0)
Hemoglobin: 8.2 g/dL — ABNORMAL LOW (ref 12.0–15.0)
MCH: 26.2 pg (ref 26.0–34.0)
MCHC: 31.8 g/dL (ref 30.0–36.0)
MCV: 82.4 fL (ref 80.0–100.0)
Platelets: 283 10*3/uL (ref 150–400)
RBC: 3.13 MIL/uL — ABNORMAL LOW (ref 3.87–5.11)
RDW: 16.4 % — ABNORMAL HIGH (ref 11.5–15.5)
WBC: 11.6 10*3/uL — ABNORMAL HIGH (ref 4.0–10.5)
nRBC: 0 % (ref 0.0–0.2)

## 2020-09-03 LAB — BASIC METABOLIC PANEL
Anion gap: 10 (ref 5–15)
BUN: 49 mg/dL — ABNORMAL HIGH (ref 6–20)
CO2: 22 mmol/L (ref 22–32)
Calcium: 8.3 mg/dL — ABNORMAL LOW (ref 8.9–10.3)
Chloride: 106 mmol/L (ref 98–111)
Creatinine, Ser: 2.71 mg/dL — ABNORMAL HIGH (ref 0.44–1.00)
GFR, Estimated: 20 mL/min — ABNORMAL LOW (ref >60)
Glucose, Bld: 126 mg/dL — ABNORMAL HIGH (ref 70–99)
Potassium: 4.3 mmol/L (ref 3.5–5.1)
Sodium: 138 mmol/L (ref 135–145)

## 2020-09-03 LAB — GLUCOSE, CAPILLARY
Glucose-Capillary: 110 mg/dL — ABNORMAL HIGH (ref 70–99)
Glucose-Capillary: 245 mg/dL — ABNORMAL HIGH (ref 70–99)

## 2020-09-03 MED ORDER — TORSEMIDE 40 MG PO TABS
40.0000 mg | ORAL_TABLET | Freq: Every day | ORAL | 6 refills | Status: DC
Start: 2020-09-04 — End: 2020-09-29

## 2020-09-03 MED ORDER — ISOSORBIDE MONONITRATE ER 60 MG PO TB24
60.0000 mg | ORAL_TABLET | Freq: Every day | ORAL | Status: DC
  Administered 2020-09-03: 60 mg via ORAL
  Filled 2020-09-03: qty 1

## 2020-09-03 MED ORDER — FAMOTIDINE 20 MG PO TABS
20.0000 mg | ORAL_TABLET | Freq: Every day | ORAL | Status: DC
  Administered 2020-09-03: 20 mg via ORAL
  Filled 2020-09-03: qty 1

## 2020-09-03 MED ORDER — FERROUS SULFATE 325 (65 FE) MG PO TABS
325.0000 mg | ORAL_TABLET | Freq: Two times a day (BID) | ORAL | Status: DC
Start: 2020-09-03 — End: 2020-09-03

## 2020-09-03 MED ORDER — EZETIMIBE 10 MG PO TABS: 10.0000 mg | ORAL_TABLET | Freq: Every day | ORAL | Status: DC

## 2020-09-03 MED ORDER — ALLOPURINOL 100 MG PO TABS
100.0000 mg | ORAL_TABLET | Freq: Every day | ORAL | Status: DC
  Administered 2020-09-03: 100 mg via ORAL
  Filled 2020-09-03: qty 1

## 2020-09-03 MED ORDER — ALLOPURINOL 100 MG PO TABS: 100.0000 mg | ORAL_TABLET | Freq: Every day | ORAL | Status: DC

## 2020-09-03 MED ORDER — TORSEMIDE 20 MG PO TABS
40.0000 mg | ORAL_TABLET | Freq: Every day | ORAL | Status: DC
Start: 2020-09-04 — End: 2020-09-03

## 2020-09-03 MED ORDER — HYDRALAZINE HCL 50 MG PO TABS
100.0000 mg | ORAL_TABLET | Freq: Three times a day (TID) | ORAL | Status: DC
Start: 2020-09-03 — End: 2020-09-03
  Administered 2020-09-03: 100 mg via ORAL
  Filled 2020-09-03: qty 2

## 2020-09-03 NOTE — TOC Transition Note (Signed)
Transition of Care [facility]) - CM/SW Discharge Note   Patient Details  Name: ASHAWNA HANBACK MRN: [id number] Date of Birth: 11/29/1960  [facility]) CM/SW Contact:  Zenon Mayo, RN Phone Number: 09/03/2020, 12:49 PM   Clinical Narrative:    For dc today, she has no needs.   Final next level of care: Home/Self Care Barriers to Discharge: No Barriers Identified   Patient Goals and CMS Choice Patient states their goals for this hospitalization and ongoing recovery are:: home   Choice offered to / list presented to : NA  Discharge Placement                       Discharge Plan and Services In-house Referral: NA Discharge Planning Services: CM Consult Post Acute Care Choice: NA                    HH Arranged: NA          Social Determinants of Health (SDOH) Interventions     Readmission Risk Interventions Readmission Risk Prevention Plan 09/03/2020  Transportation Screening Complete  PCP or Specialist Appt within 3-5 Days (No Data)  [place] Walk or [place] Complete  Social Work Consult for [place] Planning/Counseling Complete  Palliative Care Screening Not Applicable  Medication Review [employer]) Complete  Some recent data might be hidden

## 2020-09-03 NOTE — Progress Notes (Signed)
Patient has home cpap unit for use and will self place when ready.

## 2020-09-03 NOTE — Discharge Instructions (Signed)
Call if any chest pain or if severe come to ER.  Heart healthy low salt, diabetic diet  Weigh daily and record weight, first thing in AM is best time.   Keep follow-up with Dr. [name].

## 2020-09-03 NOTE — Progress Notes (Addendum)
DAILY PROGRESS NOTE   Patient Name: Dana [name] Date of Encounter: 09/03/2020 Cardiologist: No primary care provider on file.  Chief Complaint   Itching yesterday  Patient Profile   60 yo female with CAD, presented with diastolic CHF  Subjective   Diuretics held yesterday, nitro gtts stopped. Was 220 negative. Creatinine significantly improved today to 2.71 (was 3.36) - baseline reported at 1.8. Leukocytosis is improving. Urine culture from 2/17 showed <10K colonies, insignificant growth. Lowest recent weight was 114 kg in 03/2020.  Objective   Vitals:   09/02/20 2007 09/03/20 0406 09/03/20 0407 09/03/20 0817  BP: (!) 148/63 134/86  (!) 161/59  Pulse:    76  Resp: 18 18  18   Temp: 98.1 F (36.7 C) 98 F (36.7 C)  98.3 F (36.8 C)  TempSrc: Oral Oral  Oral  SpO2: 96% 99%  95%  Weight:   114.9 kg   Height:        Intake/Output Summary (Last 24 hours) at 09/03/2020 0956 Last data filed at 09/02/2020 1613 Gross per 24 hour  Intake 480 ml  Output 700 ml  Net -220 ml   Filed Weights   09/01/20 0404 09/02/20 0401 09/03/20 0407  Weight: 116.1 kg 114.9 kg 114.9 kg    Physical Exam   General appearance: alert and no distress Neck: JVD - 3 cm above sternal notch, no carotid bruit and thyroid not enlarged, symmetric, no tenderness/mass/nodules Lungs: diminished breath sounds bibasilar Heart: regular rate and rhythm Abdomen: soft, non-tender; bowel sounds normal; no masses,  no organomegaly Extremities: extremities normal, atraumatic, no cyanosis or edema Pulses: 2+ and symmetric Skin: Skin color, texture, turgor normal. No rashes or lesions Neurologic: Grossly normal Psych: Pleasant  Inpatient Medications    Scheduled Meds: . aspirin EC  81 mg Oral Daily  . carvedilol  12.5 mg Oral BID WC  . clopidogrel  75 mg Oral Daily  . enoxaparin (LOVENOX) injection  30 mg Subcutaneous Q24H  . ezetimibe  10 mg Oral Daily  . insulin aspart  0-15 Units Subcutaneous TID WC   . insulin detemir  40 Units Subcutaneous Daily  . pantoprazole  40 mg Oral Daily  . sodium chloride flush  3 mL Intravenous Q12H  . sodium chloride flush  3 mL Intravenous Q12H    Continuous Infusions: . sodium chloride    . sodium chloride    . sodium chloride 10 mL/hr at 08/30/20 0539    PRN Meds: sodium chloride, sodium chloride, acetaminophen, diphenhydrAMINE, HYDROcodone-acetaminophen, LORazepam, nitroGLYCERIN, ondansetron (ZOFRAN) IV, sodium chloride flush, sodium chloride flush   Labs   Results for orders placed or performed during the hospital encounter of 08/29/20 (from the past 48 hour(s))  Glucose, capillary     Status: Abnormal   Collection Time: 09/01/20 11:21 AM  Result Value Ref Range   Glucose-Capillary 211 (H) 70 - 99 mg/dL    Comment: Glucose reference range applies only to samples taken after fasting for at least 8 hours.  Prepare RBC (crossmatch)     Status: None   Collection Time: 09/01/20 11:41 AM  Result Value Ref Range   Order Confirmation      ORDER PROCESSED BY BLOOD BANK BB SAMPLE OR UNITS ALREADY AVAILABLE Performed at [facility], [place] [address] [place], [place] [address]   Culture, Urine     Status: Abnormal   Collection Time: 09/01/20  1:45 PM   Specimen: Urine, Random  Result Value Ref Range   Specimen  Description URINE, RANDOM    Special Requests NONE    Culture (A)     <10,000 COLONIES/mL INSIGNIFICANT GROWTH Performed at [facility], [place] [address] [place], [place] [address]    Report Status 09/02/2020 FINAL   Glucose, capillary     Status: Abnormal   Collection Time: 09/01/20  4:23 PM  Result Value Ref Range   Glucose-Capillary 214 (H) 70 - 99 mg/dL    Comment: Glucose reference range applies only to samples taken after fasting for at least 8 hours.  Occult blood card to lab, stool     Status: None   Collection Time: 09/01/20  4:50 PM  Result Value Ref Range   Fecal Occult Bld NEGATIVE NEGATIVE    Comment:  Performed at [facility], [place] [address], [place], [place] [address]  Glucose, capillary     Status: Abnormal   Collection Time: 09/01/20 10:04 PM  Result Value Ref Range   Glucose-Capillary 125 (H) 70 - 99 mg/dL    Comment: Glucose reference range applies only to samples taken after fasting for at least 8 hours.  Basic metabolic panel     Status: Abnormal   Collection Time: 09/02/20  3:10 AM  Result Value Ref Range   Sodium 139 135 - 145 mmol/L   Potassium 4.3 3.5 - 5.1 mmol/L   Chloride 106 98 - 111 mmol/L   CO2 21 (L) 22 - 32 mmol/L   Glucose, Bld 103 (H) 70 - 99 mg/dL    Comment: Glucose reference range applies only to samples taken after fasting for at least 8 hours.   BUN 54 (H) 6 - 20 mg/dL   Creatinine, Ser 3.36 (H) 0.44 - 1.00 mg/dL   Calcium 8.5 (L) 8.9 - 10.3 mg/dL   GFR, Estimated 15 (L) >60 mL/min    Comment: (NOTE) Calculated using the CKD-EPI Creatinine Equation (2021)    Anion gap 12 5 - 15    Comment: Performed at [facility] [address] [place], [place] [address]  CBC     Status: Abnormal   Collection Time: 09/02/20  3:10 AM  Result Value Ref Range   WBC 13.2 (H) 4.0 - 10.5 K/uL   RBC 3.21 (L) 3.87 - 5.11 MIL/uL   Hemoglobin 8.1 (L) 12.0 - 15.0 g/dL   HCT 26.4 (L) 36.0 - 46.0 %   MCV 82.2 80.0 - 100.0 fL   MCH 25.2 (L) 26.0 - 34.0 pg   MCHC 30.7 30.0 - 36.0 g/dL   RDW 16.1 (H) 11.5 - 15.5 %   Platelets 267 150 - 400 K/uL   nRBC 0.0 0.0 - 0.2 %    Comment: Performed at [facility], [place] [address] [place], [place] [address]  Glucose, capillary     Status: Abnormal   Collection Time: 09/02/20  6:47 AM  Result Value Ref Range   Glucose-Capillary 123 (H) 70 - 99 mg/dL    Comment: Glucose reference range applies only to samples taken after fasting for at least 8 hours.  Glucose, capillary     Status: Abnormal   Collection Time: 09/02/20 11:28 AM  Result Value Ref Range   Glucose-Capillary 174 (H) 70 - 99 mg/dL    Comment: Glucose  reference range applies only to samples taken after fasting for at least 8 hours.  Glucose, capillary     Status: Abnormal   Collection Time: 09/02/20  4:18 PM  Result Value Ref Range  Glucose-Capillary 254 (H) 70 - 99 mg/dL    Comment: Glucose reference range applies only to samples taken after fasting for at least 8 hours.  Glucose, capillary     Status: Abnormal   Collection Time: 09/02/20  8:43 PM  Result Value Ref Range   Glucose-Capillary 137 (H) 70 - 99 mg/dL    Comment: Glucose reference range applies only to samples taken after fasting for at least 8 hours.  Basic metabolic panel     Status: Abnormal   Collection Time: 09/03/20  5:15 AM  Result Value Ref Range   Sodium 138 135 - 145 mmol/L   Potassium 4.3 3.5 - 5.1 mmol/L   Chloride 106 98 - 111 mmol/L   CO2 22 22 - 32 mmol/L   Glucose, Bld 126 (H) 70 - 99 mg/dL    Comment: Glucose reference range applies only to samples taken after fasting for at least 8 hours.   BUN 49 (H) 6 - 20 mg/dL   Creatinine, Ser 2.71 (H) 0.44 - 1.00 mg/dL   Calcium 8.3 (L) 8.9 - 10.3 mg/dL   GFR, Estimated 20 (L) >60 mL/min    Comment: (NOTE) Calculated using the CKD-EPI Creatinine Equation (2021)    Anion gap 10 5 - 15    Comment: Performed at [facility] [address], [place], [place] [address]  CBC     Status: Abnormal   Collection Time: 09/03/20  5:15 AM  Result Value Ref Range   WBC 11.6 (H) 4.0 - 10.5 K/uL   RBC 3.13 (L) 3.87 - 5.11 MIL/uL   Hemoglobin 8.2 (L) 12.0 - 15.0 g/dL   HCT 25.8 (L) 36.0 - 46.0 %   MCV 82.4 80.0 - 100.0 fL   MCH 26.2 26.0 - 34.0 pg   MCHC 31.8 30.0 - 36.0 g/dL   RDW 16.4 (H) 11.5 - 15.5 %   Platelets 283 150 - 400 K/uL   nRBC 0.0 0.0 - 0.2 %    Comment: Performed at [facility] [address] [place], [place] [address]  Glucose, capillary     Status: Abnormal   Collection Time: 09/03/20  6:18 AM  Result Value Ref Range   Glucose-Capillary 110 (H) 70 - 99 mg/dL    Comment: Glucose  reference range applies only to samples taken after fasting for at least 8 hours.    ECG   N/A  Telemetry   Sinus rhythm - Personally Reviewed  Radiology    Recent Results (from the past 43800 hour(s))  ECHOCARDIOGRAM COMPLETE   Collection Time: 08/29/20  3:32 PM  Result Value   BP 146/44   S' Lateral 3.90   AR max vel 1.80   AV Area VTI 1.93   AV Mean grad 4.0   AV Peak grad 6.6   Ao pk vel 1.28   Area-P 1/2 3.37   MV M vel 4.31   AV Area mean vel 1.79   MV VTI 1.55   MV Peak grad 74.3   Narrative      ECHOCARDIOGRAM REPORT       Patient Name:   TALEEYA BLONDIN Date of Exam: 08/29/2020 Medical Rec #:  [id number]      Height:       65.0 in Accession #:    [id number]     Weight:       259.0 lb Date of Birth:  March 18, 1961      BSA:  2.208 m Patient Age:    33 years       BP:           146/44 mmHg Patient Gender: F              HR:           87 bpm. Exam Location:  Inpatient  Procedure: 2D Echo, Cardiac Doppler, Color Doppler and Intracardiac            Opacification Agent  Indications:    CHF-Acute Diastolic   History:        Patient has prior history of Echocardiogram examinations. CAD;                 Risk Factors:Diabetes, Hypertension, Dyslipidemia and Sleep                 Apnea. PAD. History of atrial fibrillation. S/P ablation per                 patient.   Sonographer:    Clayton Lefort RDCS (AE) Referring Phys: [place]    1. Left ventricular ejection fraction, by estimation, is 55 to 60%. The left ventricle has normal function. The left ventricle has no regional wall motion abnormalities. There is mild left ventricular hypertrophy. Left ventricular diastolic parameters  are consistent with Grade II diastolic dysfunction (pseudonormalization).  2. Right ventricular systolic function is normal. The right ventricular size is mildly enlarged. Tricuspid regurgitation signal is inadequate for assessing PA pressure.  3. Left  atrial size was mildly dilated.  4. Right atrial size was mildly dilated.  5. The mitral valve is abnormal. Moderate mitral valve regurgitation. No evidence of mitral stenosis.  6. The aortic valve is tricuspid. Aortic valve regurgitation is not visualized. No aortic stenosis is present.  7. The inferior vena cava is normal in size with greater than 50% respiratory variability, suggesting right atrial pressure of 3 mmHg. This is not consistent with volume overload.  FINDINGS  Left Ventricle: Left ventricular ejection fraction, by estimation, is 55 to 60%. The left ventricle has normal function. The left ventricle has no regional wall motion abnormalities. Definity contrast agent was given IV to delineate the left ventricular  endocardial borders. The left ventricular internal cavity size was normal in size. There is mild left ventricular hypertrophy. Left ventricular diastolic parameters are consistent with Grade II diastolic dysfunction (pseudonormalization).  Right Ventricle: The right ventricular size is mildly enlarged. No increase in right ventricular wall thickness. Right ventricular systolic function is normal. Tricuspid regurgitation signal is inadequate for assessing PA pressure.  Left Atrium: Left atrial size was mildly dilated.  Right Atrium: Right atrial size was mildly dilated.  Pericardium: There is no evidence of pericardial effusion.  Mitral Valve: The mitral valve is abnormal. Mild mitral annular calcification. Moderate mitral valve regurgitation. No evidence of mitral valve stenosis. MV peak gradient, 6.2 mmHg. The mean mitral valve gradient is 2.0 mmHg.  Tricuspid Valve: The tricuspid valve is normal in structure. Tricuspid valve regurgitation is not demonstrated.  Aortic Valve: The aortic valve is tricuspid. Aortic valve regurgitation is not visualized. No aortic stenosis is present. Aortic valve mean gradient measures 4.0 mmHg. Aortic valve peak gradient measures 6.6 mmHg.  Aortic valve area, by VTI measures 1.93  cm.  Pulmonic Valve: The pulmonic valve was normal in structure. Pulmonic valve regurgitation is not visualized.  Aorta: The aortic root is normal in size and structure.  Venous: The inferior  vena cava is normal in size with greater than 50% respiratory variability, suggesting right atrial pressure of 3 mmHg.  IAS/Shunts: No atrial level shunt detected by color flow Doppler.    LEFT VENTRICLE PLAX 2D LVIDd:         5.10 cm  Diastology LVIDs:         3.90 cm  LV e' medial:    4.74 cm/s LV PW:         1.30 cm  LV E/e' medial:  19.6 LV IVS:        1.40 cm  LV e' lateral:   5.15 cm/s LVOT diam:     1.90 cm  LV E/e' lateral: 18.1 LV SV:         45 LV SV Index:   20 LVOT Area:     2.84 cm    RIGHT VENTRICLE             IVC RV Basal diam:  4.10 cm     IVC diam: 1.50 cm RV Mid diam:    3.60 cm RV S prime:     13.80 cm/s TAPSE (M-mode): 2.7 cm  LEFT ATRIUM             Index       RIGHT ATRIUM           Index LA diam:        3.30 cm 1.49 cm/m  RA Area:     20.90 cm LA Vol (A2C):   44.7 ml 20.25 ml/m RA Volume:   64.90 ml  29.40 ml/m LA Vol (A4C):   39.8 ml 18.03 ml/m LA Biplane Vol: 43.9 ml 19.88 ml/m  AORTIC VALVE AV Area (Vmax):    1.80 cm AV Area (Vmean):   1.79 cm AV Area (VTI):     1.93 cm AV Vmax:           128.00 cm/s AV Vmean:          86.900 cm/s AV VTI:            0.231 m AV Peak Grad:      6.6 mmHg AV Mean Grad:      4.0 mmHg LVOT Vmax:         81.40 cm/s LVOT Vmean:        54.800 cm/s LVOT VTI:          0.157 m LVOT/AV VTI ratio: 0.68   AORTA Ao Root diam: 3.20 cm Ao Asc diam:  3.00 cm  MITRAL VALVE MV Area (PHT): 3.37 cm    SHUNTS MV Area VTI:   1.55 cm    Systemic VTI:  0.16 m MV Peak grad:  6.2 mmHg    Systemic Diam: 1.90 cm MV Mean grad:  2.0 mmHg MV Vmax:       1.24 m/s MV Vmean:      62.5 cm/s MV Decel Time: 225 msec MR Peak grad: 74.3 mmHg MR Mean grad: 49.0 mmHg MR Vmax:      431.00 cm/s MR  Vmean:     332.0 cm/s MV E velocity: 93.00 cm/s MV A velocity: 55.10 cm/s MV E/A ratio:  1.69  Loralie Champagne MD Electronically signed by Loralie Champagne MD Signature Date/Time: 08/29/2020/5:15:34 PM       Final     *Note: Due to a large number of results and/or encounters for the requested time period, some results have not been displayed. A complete set of results can be found in  Results Review.    Cardiac Studies   See echo above  Assessment   Active Problems:   Acute on chronic diastolic heart failure ([place])   Plan   1. Feels much better today - creatinine improving. Will restart home meds, especially hydralazine/nitrate as BP elevated, hold aldactone due to AKI. Hold farxiga due to AKI. Was on torsemide 40 mg TID at home, then advised to use as needed on 07/25/2020. Would recommend restarting torsemide 40 mg daily at discharge. Probably could go home later today with early APP follow-up in the HF clinic. Case discussed with Dr. Aundra Dubin and he is in agreement.  Time Spent Directly with Patient:  I have spent a total of 25 minutes with the patient reviewing hospital notes, telemetry, EKGs, labs and examining the patient as well as establishing an assessment and plan that was discussed personally with the patient.  > 50% of time was spent in direct patient care.  Length of Stay:  LOS: 4 days   Pixie Casino, MD, [facility], [place] Director of the Advanced Lipid Disorders &  Cardiovascular Risk Reduction Clinic Diplomate of the American Board of Clinical Lipidology Attending Cardiologist  Direct Dial: [phone]  Fax: [phone]  Website:  [email or web address] 09/03/2020, 9:56 AM

## 2020-09-03 NOTE — Discharge Summary (Signed)
Discharge Summary    Patient ID: Dana [name] MRN: [id number]; DOB: [birth year]  Admit date: 08/29/2020 Discharge date: 09/03/2020  PCP:  [name], MD   [place]  Cardiologist:  [name], MD  Advanced Practice Provider:  No care team member to display Electrophysiologist:  None  Advanced Heart Failure Clinic:  [name], MD        Discharge Diagnoses    Principal Problem:   Acute on chronic diastolic heart failure [facility]) Active Problems:   Anemia   Hyperlipidemia LDL goal <70   Non-ST elevation (NSTEMI) myocardial infarction ([place])   Contrast dye induced nephropathy, possible   OSA (obstructive sleep apnea)   Type 2 diabetes mellitus with complication, without long-term current use of insulin ([place])    Diagnostic Studies/Procedures    Echo 08/29/20 IMPRESSIONS    1. Left ventricular ejection fraction, by estimation, is 55 to 60%. The  left ventricle has normal function. The left ventricle has no regional  wall motion abnormalities. There is mild left ventricular hypertrophy.  Left ventricular diastolic parameters  are consistent with Grade II diastolic dysfunction (pseudonormalization).  2. Right ventricular systolic function is normal. The right ventricular  size is mildly enlarged. Tricuspid regurgitation signal is inadequate for  assessing PA pressure.  3. Left atrial size was mildly dilated.  4. Right atrial size was mildly dilated.  5. The mitral valve is abnormal. Moderate mitral valve regurgitation. No  evidence of mitral stenosis.  6. The aortic valve is tricuspid. Aortic valve regurgitation is not  visualized. No aortic stenosis is present.  7. The inferior vena cava is normal in size with greater than 50%  respiratory variability, suggesting right atrial pressure of 3 mmHg. This  is not consistent with volume overload.   FINDINGS  Left Ventricle: Left ventricular ejection fraction, by estimation, is 55   to 60%. The left ventricle has normal function. The left ventricle has no  regional wall motion abnormalities. Definity contrast agent was given IV  to delineate the left ventricular  endocardial borders. The left ventricular internal cavity size was normal  in size. There is mild left ventricular hypertrophy. Left ventricular  diastolic parameters are consistent with Grade II diastolic dysfunction  (pseudonormalization).   Right Ventricle: The right ventricular size is mildly enlarged. No  increase in right ventricular wall thickness. Right ventricular systolic  function is normal. Tricuspid regurgitation signal is inadequate for  assessing PA pressure.   Left Atrium: Left atrial size was mildly dilated.   Right Atrium: Right atrial size was mildly dilated.   Pericardium: There is no evidence of pericardial effusion.   Mitral Valve: The mitral valve is abnormal. Mild mitral annular  calcification. Moderate mitral valve regurgitation. No evidence of mitral  valve stenosis. MV peak gradient, 6.2 mmHg. The mean mitral valve gradient  is 2.0 mmHg.   Tricuspid Valve: The tricuspid valve is normal in structure. Tricuspid  valve regurgitation is not demonstrated.   Aortic Valve: The aortic valve is tricuspid. Aortic valve regurgitation is  not visualized. No aortic stenosis is present. Aortic valve mean gradient  measures 4.0 mmHg. Aortic valve peak gradient measures 6.6 mmHg. Aortic  valve area, by VTI measures 1.93  cm.   Pulmonic Valve: The pulmonic valve was normal in structure. Pulmonic valve  regurgitation is not visualized.   Aorta: The aortic root is normal in size and structure.   Venous: The inferior vena cava is normal in size with greater than 50%  respiratory variability, suggesting right atrial pressure of 3 mmHg.   IAS/Shunts: No atrial level shunt detected by color flow Doppler.  _____________   History of Present Illness     Dana [name] is a 60 y.o.  female with history of diastolic heart failure, CAD, DES RCA/LAD 2018, DMI, htn, hyperlipidemia, PAD, and OSA.   She presented to ER 08/29/20  For chest pain and CHF.   She had wings and mac n cheese the day before arrival and around 0430 in AM on the 08/29/20 she had sharp chest pain 10/10 and took 4 sl NTG.  sp02 was <88% in ER.  Continued with chest pain.  Given another NTG and IV morphine.  CTA of chest negative for PE.  + mild CHF seen.  HS troponin 41 and 66 then up to 1917.   Recent EF was 60-65%.  She was admitted for diuresis and further eval of chest pain.    Hospital Course     Consultants: none   She was placed on IV heparin, IV NTG,  EKG without acute ST changes. Continued plavix, BB and repatha.  She was given IV lasix in ER.   Plans for rt and Lt cardiac cath were arranged.  This was held due to increase of Cr from 1.87 to 2.27.  Her CPAP for OSA was continued.  She had repeat echo with EF 55-60% see above.  No RWMA.  Her Cr continued to rise to 3 and cath cancelled.   Farxiga and spiro held. For her DM Insulin added and diabetes coordinator gave recommendations.   On the 17th developed nose bleed and heparin was stopped, chest pain resolved, NTG stopped.  With elevated Cr lasix given on occ. She was transfused 1 unit PRBCs.  FOBT neg. She was given IV iron with itching post infusion.     Today seen by Dr. Debara Pickett and found stable for discharge. No further chest pain and pruritis has stopped.  Cr improved to 2.71 at discharge.  Hgb up to 8.2 and WBC at 11.6.   She is +844 since admit, and wt down from pk of 116.5 kg to 114.9 Kg.   Will continue to hold aldactone, hold farxiga, will resume torsemide 40 mg daily and will follow up in AHF clinic next week.    Cardiac cath will be addressed as outpt once Cr is stable.  Continue Repatha for HLD.  Will continue levemir insulin.   Anemia will be further addressed as outpt. Is on iron as outpt continue CPAP  Did the patient have an acute coronary  syndrome (MI, NSTEMI, STEMI, etc) this admission?:  Yes                               AHA/ACC Clinical Performance & Quality Measures: 1. Aspirin prescribed? - Yes 2. ADP Receptor Inhibitor (Plavix/Clopidogrel, Brilinta/Ticagrelor or Effient/Prasugrel) prescribed (includes medically managed patients)? - Yes 3. Beta Blocker prescribed? - Yes 4. High Intensity Statin (Lipitor 40-26m or Crestor 20-431m prescribed? - Yes 5. EF assessed during THIS hospitalization? - Yes 6. For EF <40%, was ACEI/ARB prescribed? - Not Applicable (EF >/= 4050%7. For EF <40%, Aldosterone Antagonist (Spironolactone or Eplerenone) prescribed? - Not Applicable (EF >/= 4027%8. Cardiac Rehab Phase II ordered (including medically managed patients)? - No - await cath       _____________  Discharge Vitals Blood pressure (!) 161/59, pulse 76, temperature 98.3 F (36.8 C),  temperature source Oral, resp. rate 18, height _0  (1.626 m), weight 114.9 kg, SpO2 95 %.  Filed Weights   09/01/20 0404 09/02/20 0401 09/03/20 0407  Weight: 116.1 kg 114.9 kg 114.9 kg    Labs & Radiologic Studies    CBC Recent Labs    09/02/20 0310 09/03/20 0515  WBC 13.2* 11.6*  HGB 8.1* 8.2*  HCT 26.4* 25.8*  MCV 82.2 82.4  PLT 267 732   Basic Metabolic Panel Recent Labs    09/02/20 0310 09/03/20 0515  NA 139 138  K 4.3 4.3  CL 106 106  CO2 21* 22  GLUCOSE 103* 126*  BUN 54* 49*  CREATININE 3.36* 2.71*  CALCIUM 8.5* 8.3*   Liver Function Tests No results for input(s): AST, ALT, ALKPHOS, BILITOT, PROT, ALBUMIN in the last 72 hours. No results for input(s): LIPASE, AMYLASE in the last 72 hours. High Sensitivity Troponin:   Recent Labs  Lab 08/29/20 0853 08/29/20 1148 08/29/20 1700 08/30/20 1814 08/30/20 2032  TROPONINIHS 66* 612* 1,917* 1,421* 1,471*    BNP Invalid input(s): POCBNP D-Dimer No results for input(s): DDIMER in the last 72 hours. Hemoglobin A1C No results for input(s): HGBA1C in the last 72  hours. Fasting Lipid Panel No results for input(s): CHOL, HDL, LDLCALC, TRIG, CHOLHDL, LDLDIRECT in the last 72 hours. Thyroid Function Tests No results for input(s): TSH, T4TOTAL, T3FREE, THYROIDAB in the last 72 hours.  Invalid input(s): FREET3 _____________  CT Angio Chest PE W and/or Wo Contrast  Result Date: 08/29/2020 CLINICAL DATA:  Chest pain, shortness of breath EXAM: CT ANGIOGRAPHY CHEST WITH CONTRAST TECHNIQUE: Multidetector CT imaging of the chest was performed using the standard protocol during bolus administration of intravenous contrast. Multiplanar CT image reconstructions and MIPs were obtained to evaluate the vascular anatomy. CONTRAST:  65 mL Omnipaque 350 IV COMPARISON:  02/11/2018 FINDINGS: Cardiovascular: Metallic device noted within the central left pulmonary artery compatible with pulmonary arterial pressure monitor, new since prior study. No filling defects seen to suggest pulmonary emboli. Heart is enlarged. Diffuse coronary artery calcifications. Aorta normal caliber. Mild cardiomegaly. Mediastinum/Nodes: Mildly enlarged mediastinal lymph nodes, progressed since prior study. Index right paratracheal node has a short axis diameter of 13 mm. Mildly prominent bilateral hilar lymph nodes. No axillary adenopathy. Lungs/Pleura: Diffuse ground-glass opacities throughout the lungs. Vascular congestion. Small bilateral pleural effusions. Findings most suggestive of CHF. Upper Abdomen: Imaging into the upper abdomen demonstrates no acute findings. Musculoskeletal: Chest wall soft tissues are unremarkable. No acute bony abnormality. Review of the MIP images confirms the above findings. IMPRESSION: No evidence of pulmonary embolus. Cardiomegaly with vascular congestion. Ground-glass opacities and small effusions. Findings most suggestive of mild CHF. Coronary artery disease. Pulmonary arterial pressure monitor within the left pulmonary artery. Electronically Signed   By: Rolm Baptise M.D.    On: 08/29/2020 09:56   DG Chest Port 1 View  Result Date: 08/29/2020 CLINICAL DATA:  Shortness of breath. EXAM: PORTABLE CHEST 1 VIEW COMPARISON:  Chest x-ray 02/11/2018, CT chest 02/11/2018 FINDINGS: The heart size and mediastinal contours are unchanged persistent cardiomegaly. Hilar vasculature prominence. CardioMEMS device overlying the left hilar region. Interval development of patchy airspace opacities that are more prominent within the mid to lower lung zone. Slightly increased interstitial markings. Possible trace bilateral pleural effusions. No significant pleural effusion bilaterally. No pneumothorax. No acute osseous abnormality. IMPRESSION: Vascular congestion with likely superimposed infection/inflammation. COVID-19 infection not excluded. Electronically Signed   By: Iven Finn M.D.   On: 08/29/2020 06:55  ECHOCARDIOGRAM COMPLETE  Result Date: 08/29/2020    ECHOCARDIOGRAM REPORT   Patient Name:   Dana [name] Date of Exam: 08/29/2020 Medical Rec #:  [id number]      Height:       65.0 in Accession #:    [id number]     Weight:       259.0 lb Date of Birth:  Feb 05, 1961      BSA:          2.208 m Patient Age:    70 years       BP:           146/44 mmHg Patient Gender: F              HR:           87 bpm. Exam Location:  Inpatient Procedure: 2D Echo, Cardiac Doppler, Color Doppler and Intracardiac            Opacification Agent Indications:    CHF-Acute Diastolic  History:        Patient has prior history of Echocardiogram examinations. CAD;                 Risk Factors:Diabetes, Hypertension, Dyslipidemia and Sleep                 Apnea. PAD. History of atrial fibrillation. S/P ablation per                 patient.  Sonographer:    Clayton Lefort RDCS (AE) Referring Phys: [place]  1. Left ventricular ejection fraction, by estimation, is 55 to 60%. The left ventricle has normal function. The left ventricle has no regional wall motion abnormalities. There is mild left  ventricular hypertrophy. Left ventricular diastolic parameters are consistent with Grade II diastolic dysfunction (pseudonormalization).  2. Right ventricular systolic function is normal. The right ventricular size is mildly enlarged. Tricuspid regurgitation signal is inadequate for assessing PA pressure.  3. Left atrial size was mildly dilated.  4. Right atrial size was mildly dilated.  5. The mitral valve is abnormal. Moderate mitral valve regurgitation. No evidence of mitral stenosis.  6. The aortic valve is tricuspid. Aortic valve regurgitation is not visualized. No aortic stenosis is present.  7. The inferior vena cava is normal in size with greater than 50% respiratory variability, suggesting right atrial pressure of 3 mmHg. This is not consistent with volume overload. FINDINGS  Left Ventricle: Left ventricular ejection fraction, by estimation, is 55 to 60%. The left ventricle has normal function. The left ventricle has no regional wall motion abnormalities. Definity contrast agent was given IV to delineate the left ventricular  endocardial borders. The left ventricular internal cavity size was normal in size. There is mild left ventricular hypertrophy. Left ventricular diastolic parameters are consistent with Grade II diastolic dysfunction (pseudonormalization). Right Ventricle: The right ventricular size is mildly enlarged. No increase in right ventricular wall thickness. Right ventricular systolic function is normal. Tricuspid regurgitation signal is inadequate for assessing PA pressure. Left Atrium: Left atrial size was mildly dilated. Right Atrium: Right atrial size was mildly dilated. Pericardium: There is no evidence of pericardial effusion. Mitral Valve: The mitral valve is abnormal. Mild mitral annular calcification. Moderate mitral valve regurgitation. No evidence of mitral valve stenosis. MV peak gradient, 6.2 mmHg. The mean mitral valve gradient is 2.0 mmHg. Tricuspid Valve: The tricuspid valve is  normal in structure. Tricuspid valve regurgitation is not demonstrated. Aortic Valve: The aortic valve is  tricuspid. Aortic valve regurgitation is not visualized. No aortic stenosis is present. Aortic valve mean gradient measures 4.0 mmHg. Aortic valve peak gradient measures 6.6 mmHg. Aortic valve area, by VTI measures 1.93 cm. Pulmonic Valve: The pulmonic valve was normal in structure. Pulmonic valve regurgitation is not visualized. Aorta: The aortic root is normal in size and structure. Venous: The inferior vena cava is normal in size with greater than 50% respiratory variability, suggesting right atrial pressure of 3 mmHg. IAS/Shunts: No atrial level shunt detected by color flow Doppler.  LEFT VENTRICLE PLAX 2D LVIDd:         5.10 cm  Diastology LVIDs:         3.90 cm  LV e' medial:    4.74 cm/s LV PW:         1.30 cm  LV E/e' medial:  19.6 LV IVS:        1.40 cm  LV e' lateral:   5.15 cm/s LVOT diam:     1.90 cm  LV E/e' lateral: 18.1 LV SV:         45 LV SV Index:   20 LVOT Area:     2.84 cm  RIGHT VENTRICLE             IVC RV Basal diam:  4.10 cm     IVC diam: 1.50 cm RV Mid diam:    3.60 cm RV S prime:     13.80 cm/s TAPSE (M-mode): 2.7 cm LEFT ATRIUM             Index       RIGHT ATRIUM           Index LA diam:        3.30 cm 1.49 cm/m  RA Area:     20.90 cm LA Vol (A2C):   44.7 ml 20.25 ml/m RA Volume:   64.90 ml  29.40 ml/m LA Vol (A4C):   39.8 ml 18.03 ml/m LA Biplane Vol: 43.9 ml 19.88 ml/m  AORTIC VALVE AV Area (Vmax):    1.80 cm AV Area (Vmean):   1.79 cm AV Area (VTI):     1.93 cm AV Vmax:           128.00 cm/s AV Vmean:          86.900 cm/s AV VTI:            0.231 m AV Peak Grad:      6.6 mmHg AV Mean Grad:      4.0 mmHg LVOT Vmax:         81.40 cm/s LVOT Vmean:        54.800 cm/s LVOT VTI:          0.157 m LVOT/AV VTI ratio: 0.68  AORTA Ao Root diam: 3.20 cm Ao Asc diam:  3.00 cm MITRAL VALVE MV Area (PHT): 3.37 cm    SHUNTS MV Area VTI:   1.55 cm    Systemic VTI:  0.16 m MV Peak grad:   6.2 mmHg    Systemic Diam: 1.90 cm MV Mean grad:  2.0 mmHg MV Vmax:       1.24 m/s MV Vmean:      62.5 cm/s MV Decel Time: 225 msec MR Peak grad: 74.3 mmHg MR Mean grad: 49.0 mmHg MR Vmax:      431.00 cm/s MR Vmean:     332.0 cm/s MV E velocity: 93.00 cm/s MV A velocity: 55.10 cm/s MV E/A ratio:  1.69 Dalton  Mclean MD Electronically signed by Loralie Champagne MD Signature Date/Time: 08/29/2020/5:15:34 PM    Final    Disposition   Pt is being discharged home today in good condition.  Follow-up Plans & Appointments   Call if any chest pain or if severe come to ER.  Heart healthy low salt, diabetic diet  Weigh daily and record weight, first thing in AM is best time.   Keep follow-up with Dr. [name].   Follow-up Information    Bensimhon, Shaune Pascal, MD Follow up.   Specialty: Cardiology Why: call his office Monday for appt date and time for the week of 09/05/20  Contact information: [place] [address] [phone]                Discharge Medications   Allergies as of 09/03/2020      Reactions   Naproxen Hives, Rash   Celecoxib Other (See Comments)   Stomach pain   Aspirin Other (See Comments)   CAN NOT TAKE DUE TO ULCERS   Glucosamine Hives, Swelling, Other (See Comments)   Angioedema   Metoclopramide Other (See Comments)   Facial twitching and stuttering   Metolazone Other (See Comments)   Acute renal failure   Shellfish-derived Products Hives, Swelling, Other (See Comments)   Angioedema   Tramadol Nausea And Vomiting         Medication List    STOP taking these medications   dapagliflozin propanediol 10 MG Tabs tablet Commonly known as: Farxiga   NovoLOG FlexPen 100 UNIT/ML FlexPen Generic drug: insulin aspart     TAKE these medications   allopurinol 100 MG tablet Commonly known as: ZYLOPRIM Take 1 tablet (100 mg total) by mouth daily. What changed: how much to take   aspirin 81 MG chewable tablet Chew 81 mg by  mouth daily.   carvedilol 12.5 MG tablet Commonly known as: COREG Take 1 tablet (12.5 mg total) by mouth 2 (two) times daily.   clopidogrel 75 MG tablet Commonly known as: PLAVIX TAKE 1 TABLET BY MOUTH AT  BEDTIME   EPINEPHrine 0.3 mg/0.3 mL Soaj injection Commonly known as: EPI-PEN Use as directed for life-threatening allergic reaction. What changed:   how much to take  how to take this  when to take this  reasons to take this   ezetimibe 10 MG tablet Commonly known as: ZETIA TAKE 1 TABLET BY MOUTH  DAILY   famotidine 20 MG tablet Commonly known as: PEPCID Take 1 tablet (20 mg total) by mouth daily.   ferrous sulfate 325 (65 FE) MG tablet Take 325 mg by mouth 2 (two) times daily with a meal.   FISH OIL PO Take 1 capsule by mouth daily.   hydrALAZINE 100 MG tablet Commonly known as: APRESOLINE Take 1 tablet (100 mg total) by mouth 3 (three) times daily.   HYDROcodone-acetaminophen 7.5-325 MG tablet Commonly known as: NORCO Take 1 tablet by mouth 3 (three) times daily as needed for moderate pain.   isosorbide mononitrate 60 MG 24 hr tablet Commonly known as: IMDUR Take 1 tablet (60 mg total) by mouth daily.   latanoprost 0.005 % ophthalmic solution Commonly known as: XALATAN Place 1 drop into both eyes at bedtime.   Levemir FlexTouch 100 UNIT/ML FlexPen Generic drug: insulin detemir Inject 50 Units into the skin daily.   LORazepam 0.5 MG tablet Commonly known as: ATIVAN Take 1 tablet (0.5 mg total) by mouth daily as needed for anxiety.   magnesium oxide 400  MG tablet Commonly known as: MAG-OX Take 2 tablets (800 mg total) by mouth daily. Take 2 (400 mg) daily=800 mg   nitroGLYCERIN 0.4 MG SL tablet Commonly known as: NITROSTAT Place 1 tablet (0.4 mg total) under the tongue every 5 (five) minutes as needed for chest pain.   OXYGEN Inhale 2 L into the lungs at bedtime.   Probiotic Caps Take 1 capsule by mouth daily.   promethazine 25 MG  tablet Commonly known as: PHENERGAN Take 1 tablet (25 mg total) by mouth every 6 (six) hours as needed for nausea or vomiting.   Repatha SureClick 561 MG/ML Soaj Generic drug: Evolocumab Inject 1 Dose into the skin every 14 (fourteen) days.   Torsemide 40 MG Tabs Take 40 mg by mouth daily. Start taking on: September 04, 2020          Outstanding Labs/Studies   BMP and CBC  Duration of Discharge Encounter   Greater than 30 minutes including physician time.  Signed, Cecilie Kicks, NP 09/03/2020, 11:30 AM

## 2020-09-03 NOTE — TOC Initial Note (Signed)
Transition of Care [facility]) - Initial/Assessment Note    Patient Details  Name: Dana [name] MRN: [id number] Date of Birth: 10-Jun-1961  Transition of Care [facility]) CM/SW Contact:    Zenon Mayo, RN Phone Number: 09/03/2020, 12:47 PM  Clinical Narrative:                 Patient is for dc today, NCM spoke with patient, she has cane, walker, walk in shower, scales, bp cuff, pulse ox, and diabetic supplies at home.  She drives her self to the MD apts and sometimes her spouse drives her.  She eats low sodium, low carb diet, she has a nutritionist  thru her insurance.   Expected Discharge Plan: Home/Self Care Barriers to Discharge: No Barriers Identified   Patient Goals and CMS Choice Patient states their goals for this hospitalization and ongoing recovery are:: home   Choice offered to / list presented to : NA  Expected Discharge Plan and Services Expected Discharge Plan: Home/Self Care In-house Referral: NA Discharge Planning Services: CM Consult Post Acute Care Choice: NA Living arrangements for the past 2 months: Single Family Home Expected Discharge Date: 09/03/20                         [facility] Arranged: NA          Prior Living Arrangements/Services Living arrangements for the past 2 months: Single Family Home Lives with:: Spouse Patient language and need for interpreter reviewed:: Yes Do you feel safe going back to the place where you live?: Yes      Need for Family Participation in Patient Care: Yes (Comment) Care giver support system in place?: Yes (comment) Current home services:  (cane, walker, scale, bp cuff, pulse ox, diabetic supplies,) Criminal Activity/Legal Involvement Pertinent to Current Situation/Hospitalization: No - Comment as needed  Activities of Daily Living Home Assistive Devices/Equipment: CPAP,Walker (specify type),Cane (specify quad or straight),CBG Meter,Oxygen (2l [place] at hs) ADL Screening (condition at time of admission) Patient's  cognitive ability adequate to safely complete daily activities?: Yes Is the patient deaf or have difficulty hearing?: No Does the patient have difficulty seeing, even when wearing glasses/contacts?: No Does the patient have difficulty concentrating, remembering, or making decisions?: No Patient able to express need for assistance with ADLs?: Yes Does the patient have difficulty dressing or bathing?: No Independently performs ADLs?: Yes (appropriate for developmental age) Does the patient have difficulty walking or climbing stairs?: Yes Weakness of Legs: Both Weakness of Arms/Hands: None  Permission Sought/Granted                  Emotional Assessment   Attitude/Demeanor/Rapport: Engaged Affect (typically observed): Appropriate Orientation: : Oriented to Place,Oriented to  Time,Oriented to Situation,Oriented to Self Alcohol / Substance Use: Not Applicable Psych Involvement: No (comment)  Admission diagnosis:  Acute on chronic diastolic heart failure ([place]) [I50.33] Patient Active Problem List   Diagnosis Date Noted  . Non-ST elevation (NSTEMI) myocardial infarction ([place]) 09/03/2020  . Contrast dye induced nephropathy, possible 09/03/2020  . OSA (obstructive sleep apnea) 09/03/2020  . Type 2 diabetes mellitus with complication, without long-term current use of insulin ([place]) 09/03/2020  . Acute on chronic diastolic heart failure ([place]) 08/29/2020  . Need for COVID-19 vaccine 06/28/2020  . Medicare annual wellness visit, subsequent 06/13/2020  . Chronic respiratory failure with hypoxia ([place]) 03/23/2020  . Chronic gout without tophus 03/23/2020  . Pain in both hands 03/23/2020  . Uncomplicated opioid dependence ([place])  03/23/2020  . Depression, major, recurrent, mild ([place]) 12/21/2019  . Hypomagnesemia 12/21/2019  . Diarrhea 12/21/2019  . Lumbar back pain 10/05/2019  . Diabetic nephropathy associated with type 2 diabetes mellitus ([place]) 10/05/2019  . Hypertensive heart and renal  disease with congestive heart failure ([place]) 09/18/2019  . Demand ischemia ([place])   . Diastolic heart failure ([place]) 11/07/2017  . Morbid obesity with BMI of 45.0-49.9, adult ([place]) 11/07/2017  . Coronary artery disease involving native coronary artery of native heart without angina pectoris 12/25/2016  . Hyperlipidemia LDL goal <70 12/14/2016  . Anemia 03/23/2016  . Chronic pain syndrome 03/23/2016  . COPD (chronic obstructive pulmonary disease) ([place]) 03/23/2016  . GERD without esophagitis 03/23/2016  . On home oxygen therapy 03/23/2016  . S/P ablation of atrial fibrillation 10/04/2015  . Diabetic polyneuropathy associated with type 2 diabetes mellitus ([place]) 04/02/2014   PCP:  [name], MD Pharmacy:   [place], [place] [address] [place] [address] Phone: [phone] Fax: [place], [place], [address] [place], [place] [address] [place] [address] Phone: [phone] Fax: [phone]     Social Determinants of Health (SDOH) Interventions    Readmission Risk Interventions Readmission Risk Prevention Plan 09/03/2020  Transportation Screening Complete  PCP or Specialist Appt within 3-5 Days (No Data)  [place] or [place] Complete  Social Work Consult for [place] Planning/Counseling [place] Not Applicable  Medication Review Press photographer) Complete  Some recent data might be hidden

## 2020-09-03 NOTE — Plan of Care (Signed)
°  Problem: Education: °Goal: Ability to demonstrate management of disease process will improve °Outcome: Progressing °Goal: Ability to verbalize understanding of medication therapies will improve °Outcome: Progressing °Goal: Individualized Educational Video(s) °Outcome: Progressing °  °

## 2020-09-05 ENCOUNTER — Telehealth: Payer: Self-pay

## 2020-09-05 NOTE — Telephone Encounter (Signed)
Transition Care Management Follow-up Telephone Call  Date of discharge and from where: [facility]   How have you been since you were released from the hospital? Doing well  Any questions or concerns? No  Items Reviewed:  Did the pt receive and understand the discharge instructions provided? Yes   Medications obtained and verified? Yes   Other? No   Any new allergies since your discharge? No   Dietary orders reviewed? Yes  Do you have support at home? Yes   Home Care and Equipment/Supplies: Were home health services ordered? not applicable If so, what is the name of the agency?   Has the agency set up a time to come to the patient's home? not applicable Were any new equipment or medical supplies ordered?  No What is the name of the medical supply agency?  Were you able to get the supplies/equipment? not applicable Do you have any questions related to the use of the equipment or supplies? No  Functional Questionnaire: (I = Independent and D = Dependent) ADLs: I  Bathing/Dressing- I  Meal Prep- I  Eating- I  Maintaining continence- I  Transferring/Ambulation-I  Managing Meds- I  Follow up appointments reviewed:   PCP Hospital f/u appt confirmed? No    Specialist Hospital f/u appt confirmed? Yes  Scheduled to see Dr. Sung Amabile 09/12/2020  Are transportation arrangements needed? No   If their condition worsens, is the pt aware to call PCP or go to the Emergency Dept.? Yes  Was the patient provided with contact information for the PCP's office or ED? Yes  Was to pt encouraged to call back with questions or concerns? Yes

## 2020-09-09 NOTE — Progress Notes (Signed)
Advanced Heart Failure Clinic Note  PCP:  [name], MD  Cardiologist:  [name], MD Primary HF:  Dr [name]   Chief Complaint: Heart Failure   History of Present Illness:  Dana [name] is a 60 y.o. female with obesity, diastolic heart failure, DM, neuropathy, HTN, hyperlipidemia, CAD (most recent [facility] 08/2016 DES RCA and stent LAD total of 4 stents), OSA on CPAP, PAF s/p ablation, PAD S/P L great toe and 4th toe amputation.   Echo 5/21 EF 60-65% RV ok. Grade II DD Personally reviewed  Zio patch 02/2018 (for possible syncope) with no arrhythmias, only occasional PACs/PVCs  She is s/p Cardiomems placement (8/19).  Last seen via telehealth 1/22 and was doing well. Wilder Glade started earlier this year and has stopped torsemide and metolazone. Remained active. PAD on cardiomems ranging 7-10.  Admission (2/22) for CP and CHF, in the setting of dietary indiscretion. She was admitted for diuresis and CP evaluation. EKG without acute ST changes. She was diuresed with IV lasix.  Planned for [facility] but cancelled due to rising creatinine. Repeat echo (2/22) EF 55-60%. Creatinine on discharge down to 2.71, weight 253 lbs.  Today she returns for post hospital follow up. Still feels SOB with minimal activity, using a cane. Previously said she could walk further distances on flat ground before becoming SOB. Denies increasing CP, dizziness, edema, or PND/Orthopnea. +bendopnea. Appetite ok. No fever or chills. Weight at home ~250-251 pounds. Taking all medications.   Cardiac Studies: Echo (5/21): EF 60-65%, RV ok, Grade II DD. Echo (2/22): EF 55-60%, RV ok, Grade II DD, mod MR Zio (8/19): no arrhythmias, occasional PAC/PVCs  ROS: All systems reviewed and negative except as per HPI.   Past Medical History:  Diagnosis Date  . Acquired absence of left great toe ([place])   . Acquired absence of other left toe(s) ([place])   . Anginal pain ([place])   . Atherosclerotic heart disease of native  coronary artery without angina pectoris   . CAD (coronary artery disease)    08/2016 DES to RCA, also with history of LAD stenting.   . Chronic diastolic (congestive) heart failure ([place])   . Chronic kidney disease, stage 2 (mild)   . Chronic systolic (congestive) heart failure ([place])   . Contrast dye induced nephropathy, possible 09/03/2020  . Diabetes ([place])   . Diastolic CHF ([place])   . Gastro-esophageal reflux disease without esophagitis   . Hyperlipidemia   . Hypertension   . Hypertensive heart disease with heart failure ([place])   . Hypoxemia   . Idiopathic gout, multiple sites   . Localized edema   . Low back pain   . Mixed hyperlipidemia   . Mixed incontinence   . Morbid (severe) obesity due to excess calories ([place])   . Non-ST elevation (NSTEMI) myocardial infarction ([place]) 09/03/2020  . OSA (obstructive sleep apnea)   . OSA (obstructive sleep apnea) 09/03/2020  . Other specified hypothyroidism   . Primary insomnia   . Type 2 diabetes mellitus with complication, without long-term current use of insulin ([place]) 09/03/2020  . Type 2 diabetes mellitus with diabetic nephropathy [facility])    Past Surgical History:  Procedure Laterality Date  . ABDOMINAL HYSTERECTOMY     Partial- Still has both ovaries  . CARDIOVASCULAR STRESS TEST  08/13/2014   Nuclear; Normal  . CARPAL TUNNEL RELEASE    . CATARACT EXTRACTION, BILATERAL    . CESAREAN SECTION    . CHOLECYSTECTOMY    . CORONARY  ANGIOPLASTY WITH STENT PLACEMENT     3 blockages/ 1 stent  . EYE SURGERY    . RIGHT/LEFT HEART CATH AND CORONARY ANGIOGRAPHY N/A 01/07/2017   Procedure: Right/Left Heart Cath and Coronary Angiography;  Surgeon: Larey Dresser, MD;  Location: [facility];  Service: Cardiovascular;  Laterality: N/A;  . ROTATOR CUFF REPAIR    . TOE AMPUTATION Left   . US ECHOCARDIOGRAPHY  05/2017   Normal    Current Outpatient Medications  Medication Sig Dispense Refill  . allopurinol (ZYLOPRIM) 100 MG tablet Take 1 tablet  (100 mg total) by mouth daily.    Marland Kitchen aspirin 81 MG chewable tablet Chew 81 mg by mouth daily.    . carvedilol (COREG) 12.5 MG tablet Take 1 tablet (12.5 mg total) by mouth 2 (two) times daily. 60 tablet 11  . clopidogrel (PLAVIX) 75 MG tablet TAKE 1 TABLET BY MOUTH AT  BEDTIME (Patient taking differently: Take 75 mg by mouth at bedtime.) 90 tablet 3  . EPINEPHrine 0.3 mg/0.3 mL IJ SOAJ injection Use as directed for life-threatening allergic reaction. (Patient taking differently: Inject 0.3 mg into the muscle as needed for anaphylaxis. Use as directed for life-threatening allergic reaction.) 4 Device 3  . Evolocumab (REPATHA SURECLICK) 161 MG/ML SOAJ Inject 1 Dose into the skin every 14 (fourteen) days. 2 pen 11  . ezetimibe (ZETIA) 10 MG tablet TAKE 1 TABLET BY MOUTH  DAILY (Patient taking differently: Take 10 mg by mouth daily.) 90 tablet 3  . famotidine (PEPCID) 20 MG tablet Take 1 tablet (20 mg total) by mouth daily. 30 tablet 5  . ferrous sulfate 325 (65 FE) MG tablet Take 325 mg by mouth 2 (two) times daily with a meal.    . hydrALAZINE (APRESOLINE) 100 MG tablet Take 1 tablet (100 mg total) by mouth 3 (three) times daily. 270 tablet 3  . HYDROcodone-acetaminophen (NORCO) 7.5-325 MG tablet Take 1 tablet by mouth 3 (three) times daily as needed for moderate pain. 90 tablet 0  . isosorbide mononitrate (IMDUR) 60 MG 24 hr tablet Take 1 tablet (60 mg total) by mouth daily. 90 tablet 0  . latanoprost (XALATAN) 0.005 % ophthalmic solution Place 1 drop into both eyes at bedtime.    Marland Kitchen LORazepam (ATIVAN) 0.5 MG tablet Take 1 tablet (0.5 mg total) by mouth daily as needed for anxiety. 30 tablet 2  . magnesium oxide (MAG-OX) 400 MG tablet Take 2 tablets (800 mg total) by mouth daily. Take 2 (400 mg) daily=800 mg (Patient taking differently: Take 400 mg by mouth daily. Take 2 (400 mg) daily=800 mg) 60 tablet 1  . nitroGLYCERIN (NITROSTAT) 0.4 MG SL tablet Place 1 tablet (0.4 mg total) under the tongue every 5  (five) minutes as needed for chest pain. 15 tablet 2  . Omega-3 Fatty Acids (FISH OIL PO) Take 1 capsule by mouth daily.    . OXYGEN Inhale 2 L into the lungs at bedtime.    . Probiotic CAPS Take 1 capsule by mouth daily.    . promethazine (PHENERGAN) 25 MG tablet Take 1 tablet (25 mg total) by mouth every 6 (six) hours as needed for nausea or vomiting. 90 tablet 1  . torsemide 40 MG TABS Take 40 mg by mouth daily. 30 tablet 6   No current facility-administered medications for this encounter.    Allergies:   Naproxen, Celecoxib, Aspirin, Glucosamine, Metoclopramide, Metolazone, Shellfish-derived products, and Tramadol   Social History:  The patient  reports that she  has quit smoking. She has never used smokeless tobacco. She reports previous alcohol use. She reports previous drug use.   Family History:  The patient's family history includes Bipolar disorder in an other family member; Breast cancer in her mother; Coronary artery disease in her mother; Depression in an other family member; Diabetes type II in her mother; Hyperlipidemia in her mother; Hypertension in her father and mother; Lung cancer in her mother; Schizophrenia in an other family member.   Recent Labs: 12/21/2019: Magnesium 1.9 03/23/2020: TSH 3.590 06/28/2020: ALT 13 08/29/2020: B Natriuretic Peptide 87.6 09/03/2020: BUN 49; Creatinine, Ser 2.71; Hemoglobin 8.2; Platelets 283; Potassium 4.3; Sodium 138  Personally reviewed   Wt Readings from Last 3 Encounters:  09/12/20 115.9 kg (255 lb 9.6 oz)  09/03/20 114.9 kg (253 lb 3.2 oz)  07/25/20 117.5 kg (259 lb)    Physical Exam: General:  NAD. No resp difficulty HEENT: Normal Neck: Supple. No JVD. Carotids 2+ bilat; no bruits. No lymphadenopathy or thryomegaly appreciated. Cor: PMI nondisplaced. Regular rate & rhythm. No rubs, gallops or murmurs. Lungs: Clear Abdomen: Obese, soft, nontender, nondistended. No hepatosplenomegaly. No bruits or masses. Good bowel  sounds. Extremities: No cyanosis, clubbing, rash, edema Neuro: Alert & oriented x 3, cranial nerves grossly intact. Moves all 4 extremities w/o difficulty. Affect pleasant.  ECG: SR 80 bpm (personally reviewed).  Cardiomems: PAD 10 today (goal 10).  Assessment & Plan: 1. NSTEMI/ CAD:  - H/o DES RCA/LAD in 2018  - Echo (2/22): EF 55-60%. No RWMAs. RV normal. No effusion.  - Needs LHC. Will schedule when creatinine comes back down.  - Continue medical management.  - No CP today. - ASA 81 + Plavix 75+ beta blocker.  - on Repatha for HLD.   2. Chronic Diastolic Heart Failure - Echo 5/19 LVEF 65-70%, Grade 2 DD - s/p Cardiomems device 03/11/18.  - Echo 5/21 EF 60-65% RV normal grade II DD - Echo (2/22): EF 55-60%, RV ok, Grade II DD, mod MR - NYHA III- early IIIb, ? If symptoms from general physical inactivity and body habitus. Cardiomems PAD reading 10 today. Volume stable today. Wilder Glade & spiro held due to recent AKI. Will continue to hold to allow kidney function to come back to baseline in anticipation of LHC. - Continue torsemide 40 mg daily. - Continue carvedilol 12.5 mg bid. -Continue hydralazine100mg  tid + Imdur 60 mg daily. - Continue compression stockings.  - BMET today.   3. Stage III CKD  - Baseline SCr ~1.4 - BMET today. - Continue to hold spiro & Farxiga.  4. HLD - On Repatha, followed by Dr. Debara Pickett.   5. OSA - Continue CPAP nightly order CPAP qhs.   6. Type 2 DM  - last Hgb A1c 6.7 (12/21)  - On insulin + Levemir  7. Anemia, IDA - Received feraheme. Hgb 8.2 on discharge. - CBC today.  8. Obesity Body mass index is 43.87 kg/m.  - Encouraged portion control and increased physical activity as able.  Follow up in 3-4 weeks, if/when SCr back to baseline, will schedule LHC.  **Daughter getting married 10/08/20, so would like to schedule procedures around this date**  Signed, Rafael Bihari, FNP-BC  09/12/2020 10:51 AM  [facility] [facility] [address] [facility] [place] [address] [phone] (office) [phone] (fax)

## 2020-09-12 ENCOUNTER — Encounter (HOSPITAL_COMMUNITY): Payer: Self-pay

## 2020-09-12 ENCOUNTER — Encounter (HOSPITAL_COMMUNITY): Payer: Self-pay | Admitting: Cardiology

## 2020-09-12 ENCOUNTER — Other Ambulatory Visit: Payer: Self-pay

## 2020-09-12 ENCOUNTER — Telehealth (HOSPITAL_COMMUNITY): Payer: Self-pay

## 2020-09-12 ENCOUNTER — Ambulatory Visit (HOSPITAL_COMMUNITY)
Admission: RE | Admit: 2020-09-12 | Discharge: 2020-09-12 | Disposition: A | Discharge status: Home / Self Care | Payer: HMO | Source: Ambulatory Visit | Attending: Family Medicine | Admitting: Family Medicine

## 2020-09-12 VITALS — BP 141/89 | HR 81 | Wt 255.6 lb

## 2020-09-12 DIAGNOSIS — I2510 Atherosclerotic heart disease of native coronary artery without angina pectoris: Secondary | ICD-10-CM | POA: Insufficient documentation

## 2020-09-12 DIAGNOSIS — G4733 Obstructive sleep apnea (adult) (pediatric): Secondary | ICD-10-CM

## 2020-09-12 DIAGNOSIS — N183 Chronic kidney disease, stage 3 unspecified: Secondary | ICD-10-CM | POA: Diagnosis not present

## 2020-09-12 DIAGNOSIS — D509 Iron deficiency anemia, unspecified: Secondary | ICD-10-CM

## 2020-09-12 DIAGNOSIS — E1122 Type 2 diabetes mellitus with diabetic chronic kidney disease: Secondary | ICD-10-CM | POA: Diagnosis not present

## 2020-09-12 DIAGNOSIS — E782 Mixed hyperlipidemia: Secondary | ICD-10-CM

## 2020-09-12 DIAGNOSIS — Z6841 Body Mass Index (BMI) 40.0 and over, adult: Secondary | ICD-10-CM | POA: Insufficient documentation

## 2020-09-12 DIAGNOSIS — Z955 Presence of coronary angioplasty implant and graft: Secondary | ICD-10-CM | POA: Diagnosis not present

## 2020-09-12 DIAGNOSIS — Z7902 Long term (current) use of antithrombotics/antiplatelets: Secondary | ICD-10-CM | POA: Insufficient documentation

## 2020-09-12 DIAGNOSIS — Z7982 Long term (current) use of aspirin: Secondary | ICD-10-CM | POA: Diagnosis not present

## 2020-09-12 DIAGNOSIS — Z794 Long term (current) use of insulin: Secondary | ICD-10-CM | POA: Insufficient documentation

## 2020-09-12 DIAGNOSIS — Z8249 Family history of ischemic heart disease and other diseases of the circulatory system: Secondary | ICD-10-CM | POA: Diagnosis not present

## 2020-09-12 DIAGNOSIS — I252 Old myocardial infarction: Secondary | ICD-10-CM | POA: Insufficient documentation

## 2020-09-12 DIAGNOSIS — I130 Hypertensive heart and chronic kidney disease with heart failure and stage 1 through stage 4 chronic kidney disease, or unspecified chronic kidney disease: Secondary | ICD-10-CM | POA: Insufficient documentation

## 2020-09-12 DIAGNOSIS — I5032 Chronic diastolic (congestive) heart failure: Secondary | ICD-10-CM | POA: Insufficient documentation

## 2020-09-12 DIAGNOSIS — Z87891 Personal history of nicotine dependence: Secondary | ICD-10-CM | POA: Insufficient documentation

## 2020-09-12 LAB — BASIC METABOLIC PANEL
Anion gap: 13 (ref 5–15)
BUN: 59 mg/dL — ABNORMAL HIGH (ref 6–20)
CO2: 24 mmol/L (ref 22–32)
Calcium: 9.1 mg/dL (ref 8.9–10.3)
Chloride: 105 mmol/L (ref 98–111)
Creatinine, Ser: 2.1 mg/dL — ABNORMAL HIGH (ref 0.44–1.00)
GFR, Estimated: 27 mL/min — ABNORMAL LOW (ref >60)
Glucose, Bld: 186 mg/dL — ABNORMAL HIGH (ref 70–99)
Potassium: 5.2 mmol/L — ABNORMAL HIGH (ref 3.5–5.1)
Sodium: 142 mmol/L (ref 135–145)

## 2020-09-12 LAB — CBC
HCT: 31.1 % — ABNORMAL LOW (ref 36.0–46.0)
Hemoglobin: 9.7 g/dL — ABNORMAL LOW (ref 12.0–15.0)
MCH: 26.4 pg (ref 26.0–34.0)
MCHC: 31.2 g/dL (ref 30.0–36.0)
MCV: 84.5 fL (ref 80.0–100.0)
Platelets: 325 10*3/uL (ref 150–400)
RBC: 3.68 MIL/uL — ABNORMAL LOW (ref 3.87–5.11)
RDW: 17.2 % — ABNORMAL HIGH (ref 11.5–15.5)
WBC: 12.4 10*3/uL — ABNORMAL HIGH (ref 4.0–10.5)
nRBC: 0 % (ref 0.0–0.2)

## 2020-09-12 MED ORDER — NITROGLYCERIN 0.4 MG SL SUBL: 0.4000 mg | SUBLINGUAL_TABLET | SUBLINGUAL | 2 refills | Status: DC | PRN

## 2020-09-12 MED ORDER — CLOPIDOGREL BISULFATE 75 MG PO TABS: 75.0000 mg | ORAL_TABLET | Freq: Every day | ORAL | 3 refills | Status: DC

## 2020-09-12 NOTE — Telephone Encounter (Signed)
-----   Message from Rafael Bihari, [place] sent at 09/12/2020 12:22 PM EST ----- Will not re-start Farxiga or spiro until kidney function has returned to baseline.

## 2020-09-12 NOTE — Telephone Encounter (Signed)
Malena Edman, RN  09/12/2020 12:47 PM EST Back to Top     Patient advised and verbalized understanding. Patient states she was taking Kcl supplements, but will stop. Labs will be drawn locally, lab orders faxed   Rafael Bihari, [place]  09/12/2020 12:22 PM EST      Will not re-start Farxiga or spiro until kidney function has returned to baseline.

## 2020-09-12 NOTE — Patient Instructions (Signed)
It was great to see you today! No medication changes are needed at this time.  Labs today We will only contact you if something comes back abnormal or we need to make some changes. Otherwise no news is good news!  Your physician recommends that you schedule a follow-up appointment in: 3 weeks  in the Advanced Practitioners (PA/NP) Clinic    Do the following things EVERYDAY: 1) Weigh yourself in the morning before breakfast. Write it down and keep it in a log. 2) Take your medicines as prescribed 3) Eat low salt foods--Limit salt (sodium) to 2000 mg per day.  4) Stay as active as you can everyday 5) Limit all fluids for the day to less than 2 liters  At the [facility], you and your health needs are our priority. As part of our continuing mission to provide you with exceptional heart care, we have created designated Provider Care Teams. These Care Teams include your primary Cardiologist (physician) and Advanced Practice Providers (APPs- Physician Assistants and Nurse Practitioners) who all work together to provide you with the care you need, when you need it.   You may see any of the following providers on your designated Care Team at your next follow up: Marland Kitchen Dr [name] . Dr Loralie Champagne . Dr Vickki Muff . Darrick Grinder, NP . Lyda Jester, [place] . Audry Riles, PharmD   Please be sure to bring in all your medications bottles to every appointment.    If you have any questions or concerns before your next appointment please send [name] a message through [place] or call our office at [phone].    TO LEAVE A MESSAGE FOR THE NURSE SELECT OPTION 2, PLEASE LEAVE A MESSAGE INCLUDING: . YOUR NAME . DATE OF BIRTH . CALL BACK NUMBER . REASON FOR CALL**this is important as we prioritize the call backs  YOU WILL RECEIVE A CALL BACK THE SAME DAY AS LONG AS YOU CALL BEFORE 4:00 PM

## 2020-09-12 NOTE — Addendum Note (Signed)
Encounter addended by: Kerry Dory, CMA on: 09/12/2020 11:17 AM  Actions taken: Pharmacy for encounter modified, Order list changed

## 2020-09-19 ENCOUNTER — Other Ambulatory Visit (HOSPITAL_COMMUNITY): Payer: Self-pay | Admitting: *Deleted

## 2020-09-19 ENCOUNTER — Other Ambulatory Visit: Payer: Self-pay

## 2020-09-19 ENCOUNTER — Other Ambulatory Visit: Payer: HMO

## 2020-09-19 ENCOUNTER — Telehealth (HOSPITAL_COMMUNITY): Payer: Self-pay | Admitting: *Deleted

## 2020-09-19 DIAGNOSIS — I5022 Chronic systolic (congestive) heart failure: Secondary | ICD-10-CM

## 2020-09-19 MED ORDER — HYDROCODONE-ACETAMINOPHEN 7.5-325 MG PO TABS: 1.0000 | ORAL_TABLET | Freq: Three times a day (TID) | ORAL | 0 refills | Status: DC | PRN

## 2020-09-19 NOTE — Telephone Encounter (Signed)
Lab order faxed to [phone] as requested by pt.

## 2020-09-20 LAB — BASIC METABOLIC PANEL
BUN/Creatinine Ratio: 23 (ref 9–23)
BUN: 51 mg/dL — ABNORMAL HIGH (ref 6–24)
CO2: 21 mmol/L (ref 20–29)
Calcium: 8.9 mg/dL (ref 8.7–10.2)
Chloride: 101 mmol/L (ref 96–106)
Creatinine, Ser: 2.2 mg/dL — ABNORMAL HIGH (ref 0.57–1.00)
Glucose: 124 mg/dL — ABNORMAL HIGH (ref 65–99)
Potassium: 5.3 mmol/L — ABNORMAL HIGH (ref 3.5–5.2)
Sodium: 139 mmol/L (ref 134–144)
eGFR: 26 mL/min/{1.73_m2} — ABNORMAL LOW (ref >59)

## 2020-09-21 ENCOUNTER — Telehealth (HOSPITAL_COMMUNITY): Payer: Self-pay | Admitting: *Deleted

## 2020-09-21 NOTE — Telephone Encounter (Signed)
Left vm for pt to return my call.  

## 2020-09-21 NOTE — Telephone Encounter (Signed)
-----   Message from Rafael Bihari, [place] sent at 09/20/2020 12:06 PM EST ----- Regarding: labs Thank you! I'm not able to attach a result note. Can you make sure she stopped her KCl supp? ALso, will NOT restart Arlyce Harman, OK to restart [place]. Repeat Labs in 1 week (you can put labs in under Amy's number since I won't be here to review them).  Thanks! ----- Message ----- From: Harvie Junior, CMA Sent: 09/20/2020  11:42 AM EST To: Rafael Bihari, FNP  1 week labs

## 2020-09-21 NOTE — Telephone Encounter (Signed)
Pt returned call she is aware and agreeable with plan. Labs will be drawn at Dr.Cox office and faxed.

## 2020-09-21 NOTE — Addendum Note (Signed)
Addended by: Harvie Junior on: 09/21/2020 02:05 PM   Modules accepted: Orders

## 2020-09-26 ENCOUNTER — Other Ambulatory Visit: Payer: Self-pay

## 2020-09-26 ENCOUNTER — Ambulatory Visit (INDEPENDENT_AMBULATORY_CARE_PROVIDER_SITE_OTHER): Payer: HMO | Admitting: Family Medicine

## 2020-09-26 VITALS — BP 148/50 | HR 88 | Temp 97.3°F | Resp 18 | Ht 65.0 in | Wt 250.0 lb

## 2020-09-26 DIAGNOSIS — I214 Non-ST elevation (NSTEMI) myocardial infarction: Secondary | ICD-10-CM | POA: Diagnosis not present

## 2020-09-26 DIAGNOSIS — I129 Hypertensive chronic kidney disease with stage 1 through stage 4 chronic kidney disease, or unspecified chronic kidney disease: Secondary | ICD-10-CM

## 2020-09-26 DIAGNOSIS — E1142 Type 2 diabetes mellitus with diabetic polyneuropathy: Secondary | ICD-10-CM | POA: Diagnosis not present

## 2020-09-26 DIAGNOSIS — I130 Hypertensive heart and chronic kidney disease with heart failure and stage 1 through stage 4 chronic kidney disease, or unspecified chronic kidney disease: Secondary | ICD-10-CM | POA: Diagnosis not present

## 2020-09-26 DIAGNOSIS — N184 Chronic kidney disease, stage 4 (severe): Secondary | ICD-10-CM

## 2020-09-26 DIAGNOSIS — D508 Other iron deficiency anemias: Secondary | ICD-10-CM | POA: Diagnosis not present

## 2020-09-26 NOTE — Progress Notes (Signed)
Subjective:  Patient ID: Dana [name], female    DOB: [birth year]  Age: 60 y.o. MRN: [id number]  Chief Complaint  Patient presents with  . Coronary Artery Disease  . Diabetes    HPI Patient is a 61 year old with type 2 diabetes complicated by chronic kidney disease, coronary artery disease, diabetic retinopathy, gastroparesis, and glomerulonephropathy who presents for follow-up after being admitted on August 29, 2020 through September 03, 2020 for a non-STEMI.  Her troponin peaked at 1917 on August 29, 2020.  Patient initially presented with chest pain rated 10 out of 10.  She took a total of 4 sublingual nitroglycerin without resolution of her chest pain.  She was given another nitroglycerin and IV more morphine in the emergency department.  CTA of her chest was negative for pulmonary embolism. She was put on IV heparin, IV nitroglycerin, IV Lasix.  She was continued on her Plavix, carvedilol and Repatha. Unfortunately she was unable to go undergo a heart catheterization due to acute renal failure on chronic kidney disease. Her creatinine peaked at 3.36 during her admission. This may have been due to contrast required for CTA. Farxiga and spironolactone were held.  Diabetes was controlled with additional insulin. Echo showed an EF of 55 to 60%. She was found to be anemic and was given 1 unit of packed red blood cells as well as IV iron.  Fecal occult blood test were negative.  She did develop a nosebleed and the heparin was discontinued.  Chest pain had resolved and the nitroglycerin was stopped.  Below was met upon discharge: AHA/ACC Clinical Performance & Quality Measures: 1. Aspirin prescribed? - Yes 2. ADP Receptor Inhibitor (Plavix/Clopidogrel, Brilinta/Ticagrelor or Effient/Prasugrel) prescribed (includes medically managed patients)? - Yes 3. Beta Blocker prescribed? - Yes 4. High Intensity Statin (Lipitor 40-77m or Crestor 20-451m prescribed? - Yes 5. EF assessed during THIS  hospitalization? - Yes 6. For EF <40%, was ACEI/ARB prescribed? - Not Applicable (EF >/= 4029%7. For EF <40%, Aldosterone Antagonist (Spironolactone or Eplerenone) prescribed? - Not Applicable (EF >/= 4051%8. Cardiac Rehab Phase II ordered (including medically managed patients)? - No - await cath  Discharge summary: "Cr improved to 2.71 at discharge.  Hgb up to 8.2 and WBC at 11.6.   She is +844 since admit, and wt down from pk of 116.5 kg to 114.9 Kg.   Will continue to hold aldactone, hold farxiga, will resume torsemide 40 mg daily and will follow up in AHF clinic next week.    Cardiac cath will be addressed as outpt once Cr is stable.  Continue Repatha for HLD.  Will continue levemir insulin.   Anemia will be further addressed as outpt. Is on iron as outpt continue CPAP."  She did reported reaction to iron during which she developed hives.  These have resolved.   Type 2 diabetes: Sugar ranges 81-200.  200s or postprandial.  Patient checks her feet daily.  She has an annual eye exam.  She tries to eat healthy.  She is currently been instructed not to exercise due to needing a left heart cath.  This is going to be reassessed by cardiology.  Hypertension is still too high.  Patient has known coronary artery disease as well as the non-STEMI above.  She reports dyspnea on exertion which is her baseline.  She did experience some nausea on September 23, 2020.  She dry heaves.  Then it resolved.  No current facility-administered medications on file prior to visit.   Current  Outpatient Medications on File Prior to Visit  Medication Sig Dispense Refill  . allopurinol (ZYLOPRIM) 100 MG tablet Take 1 tablet (100 mg total) by mouth daily.    Marland Kitchen aspirin 81 MG chewable tablet Chew 81 mg by mouth daily.    . carvedilol (COREG) 12.5 MG tablet Take 1 tablet (12.5 mg total) by mouth 2 (two) times daily. 60 tablet 11  . clopidogrel (PLAVIX) 75 MG tablet Take 1 tablet (75 mg total) by mouth at bedtime. 90 tablet 3  .  dapagliflozin propanediol (FARXIGA) 10 MG TABS tablet Take 1 tablet (10 mg total) by mouth daily before breakfast. 30 tablet   . EPINEPHrine 0.3 mg/0.3 mL IJ SOAJ injection Use as directed for life-threatening allergic reaction. (Patient taking differently: Inject 0.3 mg into the muscle as needed for anaphylaxis. Use as directed for life-threatening allergic reaction.) 4 Device 3  . Evolocumab (REPATHA SURECLICK) 629 MG/ML SOAJ Inject 1 Dose into the skin every 14 (fourteen) days. 2 pen 11  . ezetimibe (ZETIA) 10 MG tablet TAKE 1 TABLET BY MOUTH  DAILY (Patient taking differently: Take 10 mg by mouth daily.) 90 tablet 3  . famotidine (PEPCID) 20 MG tablet Take 1 tablet (20 mg total) by mouth daily. 30 tablet 5  . ferrous sulfate 325 (65 FE) MG tablet Take 325 mg by mouth 2 (two) times daily with a meal.    . hydrALAZINE (APRESOLINE) 100 MG tablet Take 1 tablet (100 mg total) by mouth 3 (three) times daily. 270 tablet 3  . HYDROcodone-acetaminophen (NORCO) 7.5-325 MG tablet Take 1 tablet by mouth 3 (three) times daily as needed for moderate pain. 90 tablet 0  . isosorbide mononitrate (IMDUR) 60 MG 24 hr tablet Take 1 tablet (60 mg total) by mouth daily. 90 tablet 0  . latanoprost (XALATAN) 0.005 % ophthalmic solution Place 1 drop into both eyes at bedtime.    Marland Kitchen LORazepam (ATIVAN) 0.5 MG tablet Take 1 tablet (0.5 mg total) by mouth daily as needed for anxiety. 30 tablet 2  . magnesium oxide (MAG-OX) 400 MG tablet Take 2 tablets (800 mg total) by mouth daily. Take 2 (400 mg) daily=800 mg (Patient taking differently: Take 400 mg by mouth daily.) 60 tablet 1  . nitroGLYCERIN (NITROSTAT) 0.4 MG SL tablet Place 1 tablet (0.4 mg total) under the tongue every 5 (five) minutes as needed for chest pain. 15 tablet 2  . Omega-3 Fatty Acids (FISH OIL PO) Take 1 capsule by mouth daily.    . OXYGEN Inhale 2 L into the lungs at bedtime.    . Probiotic CAPS Take 1 capsule by mouth daily.    . promethazine (PHENERGAN)  25 MG tablet Take 1 tablet (25 mg total) by mouth every 6 (six) hours as needed for nausea or vomiting. 90 tablet 1   Past Medical History:  Diagnosis Date  . Acquired absence of left great toe ([place])   . Acquired absence of other left toe(s) ([place])   . Anginal pain ([place])   . Atherosclerotic heart disease of native coronary artery without angina pectoris   . CAD (coronary artery disease)    08/2016 DES to RCA, also with history of LAD stenting.   . Chronic diastolic (congestive) heart failure ([place])   . Chronic kidney disease, stage 2 (mild)   . Chronic systolic (congestive) heart failure ([place])   . Contrast dye induced nephropathy, possible 09/03/2020  . Diabetes ([place])   . Diastolic CHF ([place])   . Gastro-esophageal reflux disease  without esophagitis   . Hyperlipidemia   . Hypertension   . Hypertensive heart disease with heart failure ([place])   . Hypoxemia   . Idiopathic gout, multiple sites   . Localized edema   . Low back pain   . Mixed hyperlipidemia   . Mixed incontinence   . Morbid (severe) obesity due to excess calories ([place])   . Non-ST elevation (NSTEMI) myocardial infarction ([place]) 09/03/2020  . OSA (obstructive sleep apnea)   . OSA (obstructive sleep apnea) 09/03/2020  . Other specified hypothyroidism   . Primary insomnia   . Type 2 diabetes mellitus with complication, without long-term current use of insulin ([place]) 09/03/2020  . Type 2 diabetes mellitus with diabetic nephropathy [facility])    Past Surgical History:  Procedure Laterality Date  . ABDOMINAL HYSTERECTOMY     Partial- Still has both ovaries  . CARDIOVASCULAR STRESS TEST  08/13/2014   Nuclear; Normal  . CARPAL TUNNEL RELEASE    . CATARACT EXTRACTION, BILATERAL    . CESAREAN SECTION    . CHOLECYSTECTOMY    . CORONARY ANGIOPLASTY WITH STENT PLACEMENT     3 blockages/ 1 stent  . CORONARY STENT INTERVENTION N/A 09/29/2020   Procedure: CORONARY STENT INTERVENTION;  Surgeon: Sherren Mocha, MD;  Location: [facility];  Service: Cardiovascular;  Laterality: N/A;  CFX  . EYE SURGERY    . LEFT HEART CATH AND CORONARY ANGIOGRAPHY N/A 09/29/2020   Procedure: LEFT HEART CATH AND CORONARY ANGIOGRAPHY;  Surgeon: Sherren Mocha, MD;  Location: [facility];  Service: Cardiovascular;  Laterality: N/A;  . RIGHT/LEFT HEART CATH AND CORONARY ANGIOGRAPHY N/A 01/07/2017   Procedure: Right/Left Heart Cath and Coronary Angiography;  Surgeon: Larey Dresser, MD;  Location: [facility];  Service: Cardiovascular;  Laterality: N/A;  . ROTATOR CUFF REPAIR    . TOE AMPUTATION Left   . US ECHOCARDIOGRAPHY  05/2017   Normal    Family History  Problem Relation Age of Onset  . Hypertension Father   . Coronary artery disease Mother   . Hypertension Mother   . Hyperlipidemia Mother   . Diabetes type II Mother   . Breast cancer Mother   . Lung cancer Mother   . Bipolar disorder Other   . Depression Other   . Schizophrenia Other    Social History   Socioeconomic History  . Marital status: Married    Spouse name: Not on file  . Number of children: Not on file  . Years of education: Not on file  . Highest education level: Not on file  Occupational History  . Not on file  Tobacco Use  . Smoking status: Former Research scientist (life sciences)  . Smokeless tobacco: Never Used  Vaping Use  . Vaping Use: Never used  Substance and Sexual Activity  . Alcohol use: Not Currently  . Drug use: Not Currently  . Sexual activity: Not on file  Other Topics Concern  . Not on file  Social History Narrative  . Not on file   Social Determinants of Health   Financial Resource Strain: Not on file  Food Insecurity: No Food Insecurity  . Worried About Charity fundraiser in the Last Year: Never true  . Ran Out of Food in the Last Year: Never true  Transportation Needs: No Transportation Needs  . Lack of Transportation (Medical): No  . Lack of Transportation (Non-Medical): No  Physical Activity: Insufficiently Active  . Days of  Exercise per Week: 3 days  .  Minutes of Exercise per Session: 40 min  Stress: Not on file  Social Connections: Not on file    Review of Systems  Constitutional: Positive for fatigue. Negative for chills and fever.  HENT: Positive for congestion. Negative for rhinorrhea and sore throat.   Respiratory: Positive for shortness of breath (DOE). Negative for cough.   Cardiovascular: Positive for palpitations. Negative for chest pain.  Gastrointestinal: Positive for nausea and vomiting (03/11 dry heaves). Negative for abdominal pain, constipation and diarrhea.  Endocrine: Positive for polydipsia (<1800 ml fluids/day). Negative for polyphagia.  Genitourinary: Negative for dysuria and urgency.  Musculoskeletal: Negative for back pain and myalgias.  Skin: Negative for rash (Hives resolved).  Neurological: Positive for dizziness and weakness (ON 03/11). Negative for light-headedness and headaches.  Psychiatric/Behavioral: Negative for dysphoric mood. The patient is nervous/anxious.      Objective:  BP (!) 148/50   Pulse 88   Temp (!) 97.3 F (36.3 C)   Resp 18   Ht 5' 5" (1.651 m)   Wt 250 lb (113.4 kg)   BMI 41.60 kg/m   BP/Weight 10/02/2020 09/26/2020 01/09/349  Systolic BP 093 818 299  Diastolic BP 71 50 89  Wt. (Lbs) 249.56 250 255.6  BMI 41.53 41.6 43.87    Physical Exam Vitals reviewed.  Constitutional:      Appearance: Normal appearance. She is normal weight.  Neck:     Vascular: No carotid bruit.  Cardiovascular:     Rate and Rhythm: Normal rate. Rhythm irregular.     Pulses: Normal pulses.     Heart sounds: Normal heart sounds.     Comments: Rate controlled Pulmonary:     Effort: Pulmonary effort is normal. No respiratory distress.     Breath sounds: Normal breath sounds.  Abdominal:     General: Abdomen is flat. Bowel sounds are normal.     Palpations: Abdomen is soft.     Tenderness: There is no abdominal tenderness.  Neurological:     Mental Status: She is  alert.     Lab Results  Component Value Date   WBC 20.6 (H) 10/01/2020   HGB 8.3 (L) 10/01/2020   HCT 25.4 (L) 10/01/2020   PLT 318 10/01/2020   GLUCOSE 302 (H) 10/02/2020   CHOL 101 09/30/2020   TRIG 77 09/30/2020   HDL 42 09/30/2020   LDLCALC 44 09/30/2020   ALT 16 10/01/2020   AST 16 10/01/2020   NA 133 (L) 10/02/2020   K 4.1 10/02/2020   CL 98 10/02/2020   CREATININE 4.88 (H) 10/02/2020   BUN 76 (H) 10/02/2020   CO2 19 (L) 10/02/2020   TSH 3.590 03/23/2020   INR 1.08 01/01/2017   HGBA1C 6.8 (H) 09/30/2020      Assessment & Plan:   1. Non-ST elevation (NSTEMI) myocardial infarction [facility]) Patient to follow-up with cardiology and help to proceed with left heart cath as creatinine improves.  2. Diabetic polyneuropathy associated with type 2 diabetes mellitus (HCC) Continue Levemir.  Continue NovoLog sliding scale insulin. Continue to work on eating healthy.  3. Hypertensive heart and renal disease with congestive heart failure (HCC) Stage IV kidney disease.  EF is preserved.  Still has diastolic dysfunction. Continue current medications. - Comprehensive metabolic panel  4. Other iron deficiency anemia Recheck blood count - CBC with Differential/Platelet  5. CKD stage 4 secondary to hypertension ([place]) Recheck chemistry panel. Keep appointment with nephrology towards the end of this month.   Orders Placed This Encounter  Procedures  .  CBC with Differential/Platelet  . Comprehensive metabolic panel    Follow-up: 10/06/2020  An After Visit Summary was printed and given to the patient.  [name], MD [facility] [phone]

## 2020-09-27 ENCOUNTER — Telehealth (HOSPITAL_COMMUNITY): Payer: Self-pay | Admitting: *Deleted

## 2020-09-27 LAB — CBC WITH DIFFERENTIAL/PLATELET
Basophils Absolute: 0.1 10*3/uL (ref 0.0–0.2)
Basos: 1 %
EOS (ABSOLUTE): 0.3 10*3/uL (ref 0.0–0.4)
Eos: 3 %
Hematocrit: 29.9 % — ABNORMAL LOW (ref 34.0–46.6)
Hemoglobin: 9.3 g/dL — ABNORMAL LOW (ref 11.1–15.9)
Immature Grans (Abs): 0.1 10*3/uL (ref 0.0–0.1)
Immature Granulocytes: 1 %
Lymphocytes Absolute: 2 10*3/uL (ref 0.7–3.1)
Lymphs: 18 %
MCH: 26 pg — ABNORMAL LOW (ref 26.6–33.0)
MCHC: 31.1 g/dL — ABNORMAL LOW (ref 31.5–35.7)
MCV: 84 fL (ref 79–97)
Monocytes Absolute: 1 10*3/uL — ABNORMAL HIGH (ref 0.1–0.9)
Monocytes: 9 %
Neutrophils Absolute: 8.1 10*3/uL — ABNORMAL HIGH (ref 1.4–7.0)
Neutrophils: 68 %
Platelets: 327 10*3/uL (ref 150–450)
RBC: 3.58 x10E6/uL — ABNORMAL LOW (ref 3.77–5.28)
RDW: 15.9 % — ABNORMAL HIGH (ref 11.7–15.4)
WBC: 11.6 10*3/uL — ABNORMAL HIGH (ref 3.4–10.8)

## 2020-09-27 LAB — COMPREHENSIVE METABOLIC PANEL
ALT: 9 IU/L (ref 0–32)
AST: 13 IU/L (ref 0–40)
Albumin/Globulin Ratio: 1.2 (ref 1.2–2.2)
Albumin: 4.1 g/dL (ref 3.8–4.9)
Alkaline Phosphatase: 150 IU/L — ABNORMAL HIGH (ref 44–121)
BUN/Creatinine Ratio: 24 — ABNORMAL HIGH (ref 9–23)
BUN: 58 mg/dL — ABNORMAL HIGH (ref 6–24)
Bilirubin Total: 0.3 mg/dL (ref 0.0–1.2)
CO2: 23 mmol/L (ref 20–29)
Calcium: 9.1 mg/dL (ref 8.7–10.2)
Chloride: 97 mmol/L (ref 96–106)
Creatinine, Ser: 2.46 mg/dL — ABNORMAL HIGH (ref 0.57–1.00)
Globulin, Total: 3.4 g/dL (ref 1.5–4.5)
Glucose: 49 mg/dL — ABNORMAL LOW (ref 65–99)
Potassium: 4.7 mmol/L (ref 3.5–5.2)
Sodium: 139 mmol/L (ref 134–144)
Total Protein: 7.5 g/dL (ref 6.0–8.5)
eGFR: 22 mL/min/{1.73_m2} — ABNORMAL LOW (ref >59)

## 2020-09-27 NOTE — Telephone Encounter (Signed)
Labs from PCP routed to [place]. pt concerned about spike in creatinine.

## 2020-09-28 ENCOUNTER — Other Ambulatory Visit (HOSPITAL_COMMUNITY): Payer: Self-pay | Admitting: Internal Medicine

## 2020-09-29 ENCOUNTER — Inpatient Hospital Stay (HOSPITAL_COMMUNITY)
Admission: EM | Disposition: A | Discharge status: Home / Self Care | Payer: Self-pay | Source: Home / Self Care | Attending: Internal Medicine

## 2020-09-29 ENCOUNTER — Inpatient Hospital Stay (HOSPITAL_COMMUNITY)
Admission: EM | Admit: 2020-09-29 | Discharge: 2020-10-06 | DRG: 246 | Disposition: A | Discharge status: Home / Self Care | Payer: HMO | Attending: Internal Medicine | Admitting: Internal Medicine

## 2020-09-29 ENCOUNTER — Other Ambulatory Visit: Payer: Self-pay

## 2020-09-29 ENCOUNTER — Emergency Department (HOSPITAL_COMMUNITY): Payer: HMO

## 2020-09-29 ENCOUNTER — Encounter (HOSPITAL_COMMUNITY): Payer: Self-pay

## 2020-09-29 DIAGNOSIS — Z91013 Allergy to seafood: Secondary | ICD-10-CM

## 2020-09-29 DIAGNOSIS — N184 Chronic kidney disease, stage 4 (severe): Secondary | ICD-10-CM | POA: Diagnosis present

## 2020-09-29 DIAGNOSIS — I248 Other forms of acute ischemic heart disease: Secondary | ICD-10-CM | POA: Diagnosis present

## 2020-09-29 DIAGNOSIS — Z89412 Acquired absence of left great toe: Secondary | ICD-10-CM

## 2020-09-29 DIAGNOSIS — Z6839 Body mass index (BMI) 39.0-39.9, adult: Secondary | ICD-10-CM

## 2020-09-29 DIAGNOSIS — Z83438 Family history of other disorder of lipoprotein metabolism and other lipidemia: Secondary | ICD-10-CM

## 2020-09-29 DIAGNOSIS — M1000 Idiopathic gout, unspecified site: Secondary | ICD-10-CM | POA: Diagnosis present

## 2020-09-29 DIAGNOSIS — G894 Chronic pain syndrome: Secondary | ICD-10-CM | POA: Diagnosis present

## 2020-09-29 DIAGNOSIS — Z7902 Long term (current) use of antithrombotics/antiplatelets: Secondary | ICD-10-CM

## 2020-09-29 DIAGNOSIS — I249 Acute ischemic heart disease, unspecified: Secondary | ICD-10-CM | POA: Diagnosis not present

## 2020-09-29 DIAGNOSIS — K219 Gastro-esophageal reflux disease without esophagitis: Secondary | ICD-10-CM | POA: Diagnosis not present

## 2020-09-29 DIAGNOSIS — R1110 Vomiting, unspecified: Secondary | ICD-10-CM

## 2020-09-29 DIAGNOSIS — Z794 Long term (current) use of insulin: Secondary | ICD-10-CM

## 2020-09-29 DIAGNOSIS — R69 Illness, unspecified: Secondary | ICD-10-CM

## 2020-09-29 DIAGNOSIS — E1142 Type 2 diabetes mellitus with diabetic polyneuropathy: Secondary | ICD-10-CM | POA: Diagnosis not present

## 2020-09-29 DIAGNOSIS — Z87891 Personal history of nicotine dependence: Secondary | ICD-10-CM

## 2020-09-29 DIAGNOSIS — N170 Acute kidney failure with tubular necrosis: Secondary | ICD-10-CM | POA: Diagnosis not present

## 2020-09-29 DIAGNOSIS — E872 Acidosis: Secondary | ICD-10-CM | POA: Diagnosis not present

## 2020-09-29 DIAGNOSIS — N879 Dysplasia of cervix uteri, unspecified: Secondary | ICD-10-CM | POA: Diagnosis present

## 2020-09-29 DIAGNOSIS — F419 Anxiety disorder, unspecified: Secondary | ICD-10-CM | POA: Diagnosis not present

## 2020-09-29 DIAGNOSIS — Z888 Allergy status to other drugs, medicaments and biological substances status: Secondary | ICD-10-CM

## 2020-09-29 DIAGNOSIS — Z20822 Contact with and (suspected) exposure to covid-19: Secondary | ICD-10-CM | POA: Diagnosis present

## 2020-09-29 DIAGNOSIS — E1129 Type 2 diabetes mellitus with other diabetic kidney complication: Secondary | ICD-10-CM | POA: Diagnosis not present

## 2020-09-29 DIAGNOSIS — D62 Acute posthemorrhagic anemia: Secondary | ICD-10-CM | POA: Diagnosis not present

## 2020-09-29 DIAGNOSIS — G4733 Obstructive sleep apnea (adult) (pediatric): Secondary | ICD-10-CM | POA: Diagnosis not present

## 2020-09-29 DIAGNOSIS — J449 Chronic obstructive pulmonary disease, unspecified: Secondary | ICD-10-CM | POA: Diagnosis present

## 2020-09-29 DIAGNOSIS — I110 Hypertensive heart disease with heart failure: Secondary | ICD-10-CM | POA: Diagnosis not present

## 2020-09-29 DIAGNOSIS — E782 Mixed hyperlipidemia: Secondary | ICD-10-CM | POA: Diagnosis not present

## 2020-09-29 DIAGNOSIS — I2510 Atherosclerotic heart disease of native coronary artery without angina pectoris: Secondary | ICD-10-CM | POA: Diagnosis not present

## 2020-09-29 DIAGNOSIS — E1143 Type 2 diabetes mellitus with diabetic autonomic (poly)neuropathy: Secondary | ICD-10-CM | POA: Diagnosis present

## 2020-09-29 DIAGNOSIS — I214 Non-ST elevation (NSTEMI) myocardial infarction: Principal | ICD-10-CM

## 2020-09-29 DIAGNOSIS — I5030 Unspecified diastolic (congestive) heart failure: Secondary | ICD-10-CM | POA: Diagnosis not present

## 2020-09-29 DIAGNOSIS — R112 Nausea with vomiting, unspecified: Secondary | ICD-10-CM | POA: Diagnosis not present

## 2020-09-29 DIAGNOSIS — I509 Heart failure, unspecified: Secondary | ICD-10-CM

## 2020-09-29 DIAGNOSIS — Z833 Family history of diabetes mellitus: Secondary | ICD-10-CM

## 2020-09-29 DIAGNOSIS — Z90710 Acquired absence of both cervix and uterus: Secondary | ICD-10-CM

## 2020-09-29 DIAGNOSIS — K3184 Gastroparesis: Secondary | ICD-10-CM | POA: Diagnosis not present

## 2020-09-29 DIAGNOSIS — Z886 Allergy status to analgesic agent status: Secondary | ICD-10-CM

## 2020-09-29 DIAGNOSIS — I471 Supraventricular tachycardia: Secondary | ICD-10-CM | POA: Diagnosis not present

## 2020-09-29 DIAGNOSIS — Z79899 Other long term (current) drug therapy: Secondary | ICD-10-CM

## 2020-09-29 DIAGNOSIS — N179 Acute kidney failure, unspecified: Secondary | ICD-10-CM | POA: Diagnosis not present

## 2020-09-29 DIAGNOSIS — Z7982 Long term (current) use of aspirin: Secondary | ICD-10-CM

## 2020-09-29 DIAGNOSIS — E038 Other specified hypothyroidism: Secondary | ICD-10-CM | POA: Diagnosis present

## 2020-09-29 DIAGNOSIS — I5033 Acute on chronic diastolic (congestive) heart failure: Secondary | ICD-10-CM | POA: Diagnosis not present

## 2020-09-29 DIAGNOSIS — N141 Nephropathy induced by other drugs, medicaments and biological substances: Secondary | ICD-10-CM | POA: Diagnosis not present

## 2020-09-29 DIAGNOSIS — Z8249 Family history of ischemic heart disease and other diseases of the circulatory system: Secondary | ICD-10-CM

## 2020-09-29 DIAGNOSIS — I130 Hypertensive heart and chronic kidney disease with heart failure and stage 1 through stage 4 chronic kidney disease, or unspecified chronic kidney disease: Secondary | ICD-10-CM | POA: Diagnosis not present

## 2020-09-29 DIAGNOSIS — D72829 Elevated white blood cell count, unspecified: Secondary | ICD-10-CM | POA: Diagnosis not present

## 2020-09-29 DIAGNOSIS — M160 Bilateral primary osteoarthritis of hip: Secondary | ICD-10-CM | POA: Diagnosis not present

## 2020-09-29 DIAGNOSIS — E1165 Type 2 diabetes mellitus with hyperglycemia: Secondary | ICD-10-CM | POA: Diagnosis present

## 2020-09-29 DIAGNOSIS — Z89422 Acquired absence of other left toe(s): Secondary | ICD-10-CM

## 2020-09-29 DIAGNOSIS — Z885 Allergy status to narcotic agent status: Secondary | ICD-10-CM

## 2020-09-29 DIAGNOSIS — Z955 Presence of coronary angioplasty implant and graft: Secondary | ICD-10-CM

## 2020-09-29 DIAGNOSIS — I252 Old myocardial infarction: Secondary | ICD-10-CM

## 2020-09-29 DIAGNOSIS — I5043 Acute on chronic combined systolic (congestive) and diastolic (congestive) heart failure: Secondary | ICD-10-CM | POA: Diagnosis present

## 2020-09-29 DIAGNOSIS — N1832 Chronic kidney disease, stage 3b: Secondary | ICD-10-CM | POA: Diagnosis not present

## 2020-09-29 DIAGNOSIS — R040 Epistaxis: Secondary | ICD-10-CM | POA: Diagnosis not present

## 2020-09-29 DIAGNOSIS — I517 Cardiomegaly: Secondary | ICD-10-CM | POA: Diagnosis not present

## 2020-09-29 DIAGNOSIS — R432 Parageusia: Secondary | ICD-10-CM | POA: Diagnosis not present

## 2020-09-29 DIAGNOSIS — T508X5A Adverse effect of diagnostic agents, initial encounter: Secondary | ICD-10-CM | POA: Diagnosis not present

## 2020-09-29 DIAGNOSIS — R079 Chest pain, unspecified: Secondary | ICD-10-CM | POA: Diagnosis present

## 2020-09-29 HISTORY — PX: LEFT HEART CATH AND CORONARY ANGIOGRAPHY: CATH118249

## 2020-09-29 HISTORY — PX: CORONARY STENT INTERVENTION: CATH118234

## 2020-09-29 LAB — CBC
HCT: 29.5 % — ABNORMAL LOW (ref 36.0–46.0)
HCT: 28.7 % — ABNORMAL LOW (ref 36.0–46.0)
Hemoglobin: 9 g/dL — ABNORMAL LOW (ref 12.0–15.0)
Hemoglobin: 9.1 g/dL — ABNORMAL LOW (ref 12.0–15.0)
MCH: 26.4 pg (ref 26.0–34.0)
MCH: 26.8 pg (ref 26.0–34.0)
MCHC: 30.5 g/dL (ref 30.0–36.0)
MCHC: 31.7 g/dL (ref 30.0–36.0)
MCV: 86.5 fL (ref 80.0–100.0)
MCV: 84.7 fL (ref 80.0–100.0)
Platelets: 288 10*3/uL (ref 150–400)
Platelets: 288 10*3/uL (ref 150–400)
RBC: 3.41 MIL/uL — ABNORMAL LOW (ref 3.87–5.11)
RBC: 3.39 MIL/uL — ABNORMAL LOW (ref 3.87–5.11)
RDW: 16.4 % — ABNORMAL HIGH (ref 11.5–15.5)
RDW: 16.5 % — ABNORMAL HIGH (ref 11.5–15.5)
WBC: 15.3 10*3/uL — ABNORMAL HIGH (ref 4.0–10.5)
WBC: 21.6 10*3/uL — ABNORMAL HIGH (ref 4.0–10.5)
nRBC: 0 % (ref 0.0–0.2)
nRBC: 0 % (ref 0.0–0.2)

## 2020-09-29 LAB — HEPATIC FUNCTION PANEL
ALT: 20 U/L (ref 0–44)
AST: 20 U/L (ref 15–41)
Albumin: 3.3 g/dL — ABNORMAL LOW (ref 3.5–5.0)
Alkaline Phosphatase: 130 U/L — ABNORMAL HIGH (ref 38–126)
Bilirubin, Direct: 0.1 mg/dL (ref 0.0–0.2)
Indirect Bilirubin: 1 mg/dL — ABNORMAL HIGH (ref 0.3–0.9)
Total Bilirubin: 1.1 mg/dL (ref 0.3–1.2)
Total Protein: 7.7 g/dL (ref 6.5–8.1)

## 2020-09-29 LAB — BASIC METABOLIC PANEL
Anion gap: 13 (ref 5–15)
Anion gap: 10 (ref 5–15)
BUN: 51 mg/dL — ABNORMAL HIGH (ref 6–20)
BUN: 47 mg/dL — ABNORMAL HIGH (ref 6–20)
CO2: 24 mmol/L (ref 22–32)
CO2: 23 mmol/L (ref 22–32)
Calcium: 8.8 mg/dL — ABNORMAL LOW (ref 8.9–10.3)
Calcium: 8.3 mg/dL — ABNORMAL LOW (ref 8.9–10.3)
Chloride: 98 mmol/L (ref 98–111)
Chloride: 102 mmol/L (ref 98–111)
Creatinine, Ser: 2.27 mg/dL — ABNORMAL HIGH (ref 0.44–1.00)
Creatinine, Ser: 1.94 mg/dL — ABNORMAL HIGH (ref 0.44–1.00)
GFR, Estimated: 25 mL/min — ABNORMAL LOW (ref >60)
GFR, Estimated: 30 mL/min — ABNORMAL LOW (ref >60)
Glucose, Bld: 293 mg/dL — ABNORMAL HIGH (ref 70–99)
Glucose, Bld: 159 mg/dL — ABNORMAL HIGH (ref 70–99)
Potassium: 4 mmol/L (ref 3.5–5.1)
Potassium: 4 mmol/L (ref 3.5–5.1)
Sodium: 135 mmol/L (ref 135–145)
Sodium: 135 mmol/L (ref 135–145)

## 2020-09-29 LAB — LIPASE, BLOOD: Lipase: 28 U/L (ref 11–51)

## 2020-09-29 LAB — RESP PANEL BY RT-PCR (FLU A&B, COVID) ARPGX2
Influenza A by PCR: NEGATIVE
Influenza B by PCR: NEGATIVE
SARS Coronavirus 2 by RT PCR: NEGATIVE

## 2020-09-29 LAB — LACTIC ACID, PLASMA
Lactic Acid, Venous: 1.1 mmol/L (ref 0.5–1.9)
Lactic Acid, Venous: 1.5 mmol/L (ref 0.5–1.9)

## 2020-09-29 LAB — POCT ACTIVATED CLOTTING TIME: Activated Clotting Time: 339 seconds

## 2020-09-29 LAB — TROPONIN I (HIGH SENSITIVITY)
Troponin I (High Sensitivity): 60 ng/L — ABNORMAL HIGH (ref <18)
Troponin I (High Sensitivity): 239 ng/L (ref <18)

## 2020-09-29 LAB — GLUCOSE, CAPILLARY: Glucose-Capillary: 164 mg/dL — ABNORMAL HIGH (ref 70–99)

## 2020-09-29 LAB — BRAIN NATRIURETIC PEPTIDE: B Natriuretic Peptide: 268.7 pg/mL — ABNORMAL HIGH (ref 0.0–100.0)

## 2020-09-29 LAB — MAGNESIUM: Magnesium: 2.1 mg/dL (ref 1.7–2.4)

## 2020-09-29 LAB — POC OCCULT BLOOD, ED: Fecal Occult Bld: NEGATIVE

## 2020-09-29 SURGERY — LEFT HEART CATH AND CORONARY ANGIOGRAPHY
Anesthesia: LOCAL

## 2020-09-29 MED ORDER — HYDROCODONE-ACETAMINOPHEN 7.5-325 MG PO TABS
1.0000 | ORAL_TABLET | Freq: Three times a day (TID) | ORAL | Status: DC | PRN
  Administered 2020-09-29 – 2020-09-30 (×2): 1 via ORAL
  Filled 2020-09-29 (×2): qty 1

## 2020-09-29 MED ORDER — ONDANSETRON HCL 4 MG/2ML IJ SOLN
INTRAMUSCULAR | Status: DC | PRN
  Administered 2020-09-29: 4 mg via INTRAVENOUS

## 2020-09-29 MED ORDER — ONDANSETRON HCL 4 MG/2ML IJ SOLN
4.0000 mg | Freq: Four times a day (QID) | INTRAMUSCULAR | Status: DC | PRN
  Administered 2020-10-01 (×3): 4 mg via INTRAVENOUS
  Filled 2020-09-29 (×3): qty 2

## 2020-09-29 MED ORDER — CLOPIDOGREL BISULFATE 75 MG PO TABS
75.0000 mg | ORAL_TABLET | Freq: Every day | ORAL | Status: DC
  Administered 2020-09-29 – 2020-09-30 (×2): 75 mg via ORAL
  Filled 2020-09-29 (×2): qty 1

## 2020-09-29 MED ORDER — LABETALOL HCL 5 MG/ML IV SOLN: 10.0000 mg | INTRAVENOUS | Status: AC | PRN

## 2020-09-29 MED ORDER — FENTANYL CITRATE (PF) 100 MCG/2ML IJ SOLN
INTRAMUSCULAR | Status: AC
  Filled 2020-09-29: qty 2

## 2020-09-29 MED ORDER — MORPHINE SULFATE (PF) 4 MG/ML IV SOLN
4.0000 mg | Freq: Once | INTRAVENOUS | Status: AC
Start: 2020-09-29 — End: 2020-09-29
  Administered 2020-09-29: 4 mg via INTRAVENOUS
  Filled 2020-09-29: qty 1

## 2020-09-29 MED ORDER — HEPARIN (PORCINE) 25000 UT/250ML-% IV SOLN
1050.0000 [IU]/h | INTRAVENOUS | Status: DC
  Administered 2020-09-29: 1050 [IU]/h via INTRAVENOUS
  Filled 2020-09-29: qty 250

## 2020-09-29 MED ORDER — SODIUM CHLORIDE 0.9 % IV SOLN
250.0000 mL | INTRAVENOUS | Status: DC | PRN
Start: 2020-09-29 — End: 2020-10-06

## 2020-09-29 MED ORDER — LIDOCAINE HCL (PF) 1 % IJ SOLN
INTRAMUSCULAR | Status: DC | PRN
  Administered 2020-09-29: 20 mL
  Administered 2020-09-29: 2 mL
  Administered 2020-09-29: 4 mL

## 2020-09-29 MED ORDER — NITROGLYCERIN 1 MG/10 ML FOR IR/CATH LAB
INTRA_ARTERIAL | Status: DC | PRN
  Administered 2020-09-29: 200 ug via INTRACORONARY
  Administered 2020-09-29: 150 ug via INTRACORONARY

## 2020-09-29 MED ORDER — EZETIMIBE 10 MG PO TABS
10.0000 mg | ORAL_TABLET | Freq: Every day | ORAL | Status: DC
  Administered 2020-09-29 – 2020-09-30 (×2): 10 mg via ORAL
  Filled 2020-09-29 (×2): qty 1

## 2020-09-29 MED ORDER — ASPIRIN 81 MG PO CHEW
324.0000 mg | CHEWABLE_TABLET | Freq: Once | ORAL | Status: AC
  Administered 2020-09-29: 324 mg via ORAL
  Filled 2020-09-29: qty 4

## 2020-09-29 MED ORDER — SODIUM CHLORIDE 0.9 % IV SOLN
INTRAVENOUS | Status: DC | PRN
  Administered 2020-09-29: 1.75 mg/kg/h via INTRAVENOUS

## 2020-09-29 MED ORDER — SODIUM CHLORIDE 0.9 % IV SOLN: INTRAVENOUS | Status: AC

## 2020-09-29 MED ORDER — BIVALIRUDIN TRIFLUOROACETATE 250 MG IV SOLR
INTRAVENOUS | Status: AC
  Filled 2020-09-29: qty 250

## 2020-09-29 MED ORDER — SODIUM CHLORIDE 0.9% FLUSH: 3.0000 mL | INTRAVENOUS | Status: DC | PRN

## 2020-09-29 MED ORDER — HEPARIN BOLUS VIA INFUSION
4000.0000 [IU] | Freq: Once | INTRAVENOUS | Status: AC
  Administered 2020-09-29: 4000 [IU] via INTRAVENOUS
  Filled 2020-09-29: qty 4000

## 2020-09-29 MED ORDER — HYDRALAZINE HCL 50 MG PO TABS
100.0000 mg | ORAL_TABLET | Freq: Three times a day (TID) | ORAL | Status: DC
  Administered 2020-09-29 – 2020-09-30 (×4): 100 mg via ORAL
  Filled 2020-09-29 (×4): qty 2

## 2020-09-29 MED ORDER — HEPARIN (PORCINE) IN NACL 1000-0.9 UT/500ML-% IV SOLN
INTRAVENOUS | Status: AC
  Filled 2020-09-29: qty 500

## 2020-09-29 MED ORDER — ONDANSETRON HCL 4 MG/2ML IJ SOLN
INTRAMUSCULAR | Status: AC
  Filled 2020-09-29: qty 2

## 2020-09-29 MED ORDER — DIAZEPAM 5 MG PO TABS: 5.0000 mg | ORAL_TABLET | ORAL | Status: DC | PRN

## 2020-09-29 MED ORDER — NITROGLYCERIN 0.4 MG SL SUBL
0.4000 mg | SUBLINGUAL_TABLET | SUBLINGUAL | Status: DC | PRN
  Administered 2020-09-29 (×3): 0.4 mg via SUBLINGUAL
  Filled 2020-09-29: qty 1

## 2020-09-29 MED ORDER — FUROSEMIDE 10 MG/ML IJ SOLN
20.0000 mg | Freq: Once | INTRAMUSCULAR | Status: AC
  Administered 2020-09-29: 20 mg via INTRAVENOUS
  Filled 2020-09-29: qty 2

## 2020-09-29 MED ORDER — ACETAMINOPHEN 325 MG PO TABS
650.0000 mg | ORAL_TABLET | ORAL | Status: DC | PRN
  Filled 2020-09-29: qty 2

## 2020-09-29 MED ORDER — BIVALIRUDIN BOLUS VIA INFUSION - CUPID
INTRAVENOUS | Status: DC | PRN
  Administered 2020-09-29: 85.05 mg via INTRAVENOUS

## 2020-09-29 MED ORDER — FERROUS SULFATE 325 (65 FE) MG PO TABS
325.0000 mg | ORAL_TABLET | Freq: Two times a day (BID) | ORAL | Status: DC
  Administered 2020-09-30 (×2): 325 mg via ORAL
  Filled 2020-09-29 (×2): qty 1

## 2020-09-29 MED ORDER — FENTANYL CITRATE (PF) 100 MCG/2ML IJ SOLN
INTRAMUSCULAR | Status: DC | PRN
  Administered 2020-09-29: 25 ug via INTRAVENOUS

## 2020-09-29 MED ORDER — MIDAZOLAM HCL 2 MG/2ML IJ SOLN
INTRAMUSCULAR | Status: AC
  Filled 2020-09-29: qty 2

## 2020-09-29 MED ORDER — PROMETHAZINE HCL 25 MG PO TABS
25.0000 mg | ORAL_TABLET | Freq: Four times a day (QID) | ORAL | Status: DC | PRN
  Administered 2020-09-30 – 2020-10-01 (×3): 25 mg via ORAL
  Filled 2020-09-29 (×3): qty 1

## 2020-09-29 MED ORDER — NITROGLYCERIN 1 MG/10 ML FOR IR/CATH LAB
INTRA_ARTERIAL | Status: AC
  Filled 2020-09-29: qty 10

## 2020-09-29 MED ORDER — LIDOCAINE-EPINEPHRINE 1 %-1:100000 IJ SOLN
INTRAMUSCULAR | Status: DC | PRN
  Administered 2020-09-29: 5 mL

## 2020-09-29 MED ORDER — MIDAZOLAM HCL 2 MG/2ML IJ SOLN
INTRAMUSCULAR | Status: DC | PRN
  Administered 2020-09-29: 1 mg via INTRAVENOUS
  Administered 2020-09-29: 2 mg via INTRAVENOUS
  Administered 2020-09-29 (×2): 1 mg via INTRAVENOUS

## 2020-09-29 MED ORDER — SODIUM CHLORIDE 0.9% FLUSH
3.0000 mL | Freq: Two times a day (BID) | INTRAVENOUS | Status: DC
Start: 2020-09-29 — End: 2020-10-06
  Administered 2020-09-30 – 2020-10-05 (×9): 3 mL via INTRAVENOUS

## 2020-09-29 MED ORDER — HEPARIN (PORCINE) IN NACL 1000-0.9 UT/500ML-% IV SOLN
INTRAVENOUS | Status: DC | PRN
  Administered 2020-09-29 (×2): 500 mL

## 2020-09-29 MED ORDER — LIDOCAINE HCL (PF) 1 % IJ SOLN
INTRAMUSCULAR | Status: AC
  Filled 2020-09-29: qty 30

## 2020-09-29 MED ORDER — HYDRALAZINE HCL 20 MG/ML IJ SOLN: 10.0000 mg | INTRAMUSCULAR | Status: AC | PRN

## 2020-09-29 MED ORDER — SODIUM CHLORIDE 0.9% FLUSH
3.0000 mL | Freq: Two times a day (BID) | INTRAVENOUS | Status: DC
  Administered 2020-09-30 – 2020-10-06 (×7): 3 mL via INTRAVENOUS

## 2020-09-29 MED ORDER — IOHEXOL 350 MG/ML SOLN
INTRAVENOUS | Status: DC | PRN
  Administered 2020-09-29: 75 mL via INTRA_ARTERIAL

## 2020-09-29 MED ORDER — VERAPAMIL HCL 2.5 MG/ML IV SOLN
INTRAVENOUS | Status: AC
  Filled 2020-09-29: qty 2

## 2020-09-29 MED ORDER — FENTANYL CITRATE (PF) 100 MCG/2ML IJ SOLN
INTRAMUSCULAR | Status: DC | PRN
  Administered 2020-09-29 (×3): 25 ug via INTRAVENOUS
  Administered 2020-09-29: 50 ug via INTRAVENOUS
  Administered 2020-09-29: 25 ug via INTRAVENOUS

## 2020-09-29 MED ORDER — PANTOPRAZOLE SODIUM 40 MG IV SOLR
40.0000 mg | Freq: Once | INTRAVENOUS | Status: AC
  Administered 2020-09-29: 40 mg via INTRAVENOUS
  Filled 2020-09-29: qty 40

## 2020-09-29 MED ORDER — SODIUM CHLORIDE 0.9 % IV SOLN: INTRAVENOUS | Status: DC

## 2020-09-29 MED ORDER — SODIUM CHLORIDE 0.9 % IV SOLN: 250.0000 mL | INTRAVENOUS | Status: DC | PRN

## 2020-09-29 MED ORDER — SODIUM CHLORIDE 0.9% FLUSH
3.0000 mL | Freq: Two times a day (BID) | INTRAVENOUS | Status: DC
  Administered 2020-09-30: 3 mL via INTRAVENOUS

## 2020-09-29 MED ORDER — NITROGLYCERIN IN D5W 200-5 MCG/ML-% IV SOLN
3.0000 ug/min | INTRAVENOUS | Status: DC
  Administered 2020-09-29: 3 ug/min via INTRAVENOUS
  Administered 2020-09-29: 5 ug/min via INTRAVENOUS
  Administered 2020-09-29: 10 ug/min via INTRAVENOUS
  Filled 2020-09-29: qty 250

## 2020-09-29 MED ORDER — ALLOPURINOL 100 MG PO TABS
100.0000 mg | ORAL_TABLET | Freq: Every day | ORAL | Status: DC
  Administered 2020-09-29 – 2020-09-30 (×2): 100 mg via ORAL
  Filled 2020-09-29 (×2): qty 1

## 2020-09-29 MED ORDER — ASPIRIN 81 MG PO CHEW
81.0000 mg | CHEWABLE_TABLET | Freq: Every day | ORAL | Status: DC
  Administered 2020-09-30 – 2020-10-06 (×6): 81 mg via ORAL
  Filled 2020-09-29 (×6): qty 1

## 2020-09-29 MED ORDER — LIDOCAINE-EPINEPHRINE 1 %-1:100000 IJ SOLN
INTRAMUSCULAR | Status: AC
  Filled 2020-09-29: qty 1

## 2020-09-29 SURGICAL SUPPLY — 24 items
BALLN SAPPHIRE 2.5X12 (BALLOONS) ×2
BALLN SAPPHIRE 2.5X15 (BALLOONS) ×2
BALLN SAPPHIRE ~~LOC~~ 3.25X15 (BALLOONS) ×2 IMPLANT
BALLOON SAPPHIRE 2.5X12 (BALLOONS) ×1 IMPLANT
BALLOON SAPPHIRE 2.5X15 (BALLOONS) ×1 IMPLANT
CATH INFINITI 5 FR 3DRC (CATHETERS) ×2 IMPLANT
CATH INFINITI 5 FR JL3.5 (CATHETERS) ×2 IMPLANT
CATH INFINITI 5FR MULTPACK ANG (CATHETERS) ×2 IMPLANT
CATH LAUNCHER 6FR EBU 3 (CATHETERS) ×2 IMPLANT
CLOSURE PERCLOSE PROSTYLE (VASCULAR PRODUCTS) ×2 IMPLANT
DEVICE RAD COMP TR BAND LRG (VASCULAR PRODUCTS) ×2 IMPLANT
GLIDESHEATH SLEND SS 6F .021 (SHEATH) ×2 IMPLANT
KIT ENCORE 26 ADVANTAGE (KITS) ×2 IMPLANT
KIT HEART LEFT (KITS) ×2 IMPLANT
KIT MICROPUNCTURE NIT STIFF (SHEATH) ×2 IMPLANT
PACK CARDIAC CATHETERIZATION (CUSTOM PROCEDURE TRAY) ×2 IMPLANT
SHEATH PINNACLE 5F 10CM (SHEATH) ×2 IMPLANT
SHEATH PINNACLE 6F 10CM (SHEATH) ×2 IMPLANT
SHEATH PROBE COVER 6X72 (BAG) ×2 IMPLANT
STENT RESOLUTE ONYX 2.75X22 (Permanent Stent) ×2 IMPLANT
TRANSDUCER W/STOPCOCK (MISCELLANEOUS) ×2 IMPLANT
TUBING CIL FLEX 10 FLL-RA (TUBING) ×2 IMPLANT
WIRE COUGAR XT STRL 190CM (WIRE) ×4 IMPLANT
WIRE EMERALD 3MM-J .035X150CM (WIRE) ×2 IMPLANT

## 2020-09-29 NOTE — ED Notes (Signed)
Pt transported to xray 

## 2020-09-29 NOTE — Interval H&P Note (Signed)
Cath Lab Visit (complete for each Cath Lab visit)  Clinical Evaluation Leading to the Procedure:   ACS: Yes.    Non-ACS:    Anginal Classification: CCS IV  Anti-ischemic medical therapy: Minimal Therapy (1 class of medications)  Non-Invasive Test Results: No non-invasive testing performed  Prior CABG: No previous CABG      History and Physical Interval Note:  09/29/2020 4:33 PM  Dana [name]  has presented today for surgery, with the diagnosis of chest pain.  The various methods of treatment have been discussed with the patient and family. After consideration of risks, benefits and other options for treatment, the patient has consented to  Procedure(s): LEFT HEART CATH AND CORONARY ANGIOGRAPHY (N/A) as a surgical intervention.  The patient's history has been reviewed, patient examined, no change in status, stable for surgery.  I have reviewed the patient's chart and labs.  Questions were answered to the patient's satisfaction.     Sherren Mocha

## 2020-09-29 NOTE — ED Triage Notes (Addendum)
Pt here pov due to chest pain. Pt reports it started at 430 this morning. Pt reports she took 3 nitro. Pt reports h/o CHF , mild MI in past.Pt reports it started while she was sleeping. Pt reports pain in center of her chest. Pt was 88% on RA in triage. Pt placed on 2L now sp02 95%. Pt states she wear o2 at home PRN 9 (at night).

## 2020-09-29 NOTE — Progress Notes (Signed)
ANTICOAGULATION CONSULT NOTE - Initial Consult  Pharmacy Consult for heparin Indication: chest pain/ACS  Allergies  Allergen Reactions  . Naproxen Hives and Rash  . Celecoxib Other (See Comments)    Stomach pain  . Aspirin Other (See Comments)    CAN NOT TAKE DUE TO ULCERS  . Glucosamine Hives, Swelling and Other (See Comments)    Angioedema   . Metoclopramide Other (See Comments)    Facial twitching and stuttering  . Metolazone Other (See Comments)    Acute renal failure  . Shellfish-Derived Products Hives, Swelling and Other (See Comments)    Angioedema   . Tramadol Nausea And Vomiting         Patient Measurements:   Heparin Dosing Weight: 83.44  Vital Signs: Temp: 98.3 F (36.8 C) (03/17 0634) Temp Source: Oral (03/17 0634) BP: 153/54 (03/17 0715) Pulse Rate: 87 (03/17 0715)  Labs: Recent Labs    09/26/20 1200 09/29/20 0636  HGB 9.3* 9.0*  HCT 29.9* 29.5*  PLT 327 288  CREATININE 2.46*  --     Estimated Creatinine Clearance: 31.7 mL/min (A) (by C-G formula based on SCr of 2.46 mg/dL (H)).   Medical History: Past Medical History:  Diagnosis Date  . Acquired absence of left great toe ([place])   . Acquired absence of other left toe(s) ([place])   . Anginal pain ([place])   . Atherosclerotic heart disease of native coronary artery without angina pectoris   . CAD (coronary artery disease)    08/2016 DES to RCA, also with history of LAD stenting.   . Chronic diastolic (congestive) heart failure ([place])   . Chronic kidney disease, stage 2 (mild)   . Chronic systolic (congestive) heart failure ([place])   . Contrast dye induced nephropathy, possible 09/03/2020  . Diabetes ([place])   . Diastolic CHF ([place])   . Gastro-esophageal reflux disease without esophagitis   . Hyperlipidemia   . Hypertension   . Hypertensive heart disease with heart failure ([place])   . Hypoxemia   . Idiopathic gout, multiple sites   . Localized edema   . Low back pain   . Mixed hyperlipidemia   .  Mixed incontinence   . Morbid (severe) obesity due to excess calories ([place])   . Non-ST elevation (NSTEMI) myocardial infarction ([place]) 09/03/2020  . OSA (obstructive sleep apnea)   . OSA (obstructive sleep apnea) 09/03/2020  . Other specified hypothyroidism   . Primary insomnia   . Type 2 diabetes mellitus with complication, without long-term current use of insulin ([place]) 09/03/2020  . Type 2 diabetes mellitus with diabetic nephropathy (HCC)     Assessment: 47 YOF admitted with chest pain. Pharmacy has been consulted to dose heparin. No AC PTA. Hgb 9.0, PLTs 288.   Goal of Therapy:  Heparin level 0.3-0.7 units/ml Monitor platelets by anticoagulation protocol: Yes   Plan:  Heparin 4000 units x1, followed by 1050 units/hr Heparin level in 6 hours Daily heparin level, CBC  Romilda Garret, PharmD PGY1 Acute Care Pharmacy Resident 09/29/2020 8:29 AM  Please check [email or web address] for unit specific pharmacy phone numbers.

## 2020-09-29 NOTE — H&P (Addendum)
Advanced Heart Failure Team History and Physical Note   PCP:  [name], MD  PCP-Cardiology: [name], MD     Reason for Admission: Chest Pain.    HPI:     Ms Crites is 60 year old with history of diastolic heart failure, CAD, DES RCA/LAD 2018, DMI, htn, hyperlipidemia, PAD, and OSA.   Admitted 08/29/20 with chest pain and increased shortness of breath. Had CTA that negative for PE. HS Trop 41>66. This was initially thought to be from demand ischemia. Had chest pain again with HS Trop up to  1917. Placed on heparin drip + Ntg drip. Also diuresed with IV lasix. Cath was not pursued with creatinine 3.4. Hospital course complicated by AKI and anemia so cath was not pursued.   All cardiomems readings have been with in range. Goal 10. Last reading on 09/28/20 was 11.   Over the weekend she didn't feel good and complained of fatigue. Says she hasnt felt good since she was discharged. Yesterday she spent the last day in bed.  In the middle of the night she woke up with sharp chest pain. Taking all medications. Has had some shortness of breath with exertion. Denies fever or chills.  EKG with ST depression in V4 and V5. HS trop 60>239. CXR concerning for pulmonary edema. Started on nitro and heparin drip. Pain has gone from 10-->2.   Pertinent labs included: HS Trop 60>239, SARS2 negative,  Lactic acid 1.1,  BNP 268, Hgb 9, WBC 15.    Review of Systems: [y] = yes, [ ]  = no   . General: Weight gain [ ] ; Weight loss [ ] ; Anorexia [ ] ; Fatigue [ Y]; Fever [ ] ; Chills [ ] ; Weakness [ Y]  . Cardiac: Chest pain/pressure [ ] ; Resting SOB [ ] ; Exertional SOB [Y ]; Orthopnea [ ] ; Pedal Edema [Y ]; Palpitations [ ] ; Syncope [ ] ; Presyncope [ ] ; Paroxysmal nocturnal dyspnea[ ]   . Pulmonary: Cough [ ] ; Wheezing[ ] ; Hemoptysis[ ] ; Sputum [ ] ; Snoring [ ]   . GI: Vomiting[ ] ; Dysphagia[ ] ; Melena[ ] ; Hematochezia [ ] ; Heartburn[ ] ; Abdominal pain [ ] ; Constipation [ ] ; Diarrhea [ ] ; BRBPR [ ]    . GU: Hematuria[ ] ; Dysuria [ ] ; Nocturia[ ]   . Vascular: Pain in legs with walking [ ] ; Pain in feet with lying flat [ ] ; Non-healing sores [ ] ; Stroke [ ] ; TIA [ ] ; Slurred speech [ ] ;  . Neuro: Headaches[ ] ; Vertigo[ ] ; Seizures[ ] ; Paresthesias[ ] ;Blurred vision [ ] ; Diplopia [ ] ; Vision changes [ ]   . Ortho/Skin: Arthritis [ ] ; Joint pain [ ] ; Muscle pain [ ] ; Joint swelling [ ] ; Back Pain [ Y]; Rash [ ]   . Psych: Depression[ ] ; Anxiety[Y ]  . Heme: Bleeding problems [ ] ; Clotting disorders [ ] ; Anemia [ Y]  . Endocrine: Diabetes [Y ]; Thyroid dysfunction[ ]    Home Medications Prior to Admission medications   Medication Sig Start Date End Date Taking? Authorizing Provider  allopurinol (ZYLOPRIM) 100 MG tablet Take 1 tablet (100 mg total) by mouth daily. 09/03/20  Yes Isaiah Serge, NP  aspirin 81 MG chewable tablet Chew 81 mg by mouth daily.   Yes [provider]  carvedilol (COREG) 12.5 MG tablet Take 1 tablet (12.5 mg total) by mouth 2 (two) times daily. 07/25/20  Yes [place] Liz, Shaune Pascal, MD  clopidogrel (PLAVIX) 75 MG tablet Take 1 tablet (75 mg total) by mouth at bedtime. 09/12/20  Yes  Comfort, Maricela Bo, FNP  dapagliflozin propanediol (FARXIGA) 10 MG TABS tablet Take 1 tablet (10 mg total) by mouth daily before breakfast. 09/21/20  Yes Milford, Faith, FNP  EPINEPHrine 0.3 mg/0.3 mL IJ SOAJ injection Use as directed for life-threatening allergic reaction. Patient taking differently: Inject 0.3 mg into the muscle as needed for anaphylaxis. Use as directed for life-threatening allergic reaction. 06/24/18  Yes Padgett, Rae Halsted, MD  Evolocumab (REPATHA SURECLICK) 419 MG/ML SOAJ Inject 1 Dose into the skin every 14 (fourteen) days. 09/18/19  Yes Hilty, Nadean Corwin, MD  ezetimibe (ZETIA) 10 MG tablet TAKE 1 TABLET BY MOUTH  DAILY Patient taking differently: Take 10 mg by mouth daily. 06/29/20  Yes Hilty, Nadean Corwin, MD  famotidine (PEPCID) 20 MG tablet Take 1 tablet (20 mg  total) by mouth daily. 06/24/18  Yes Padgett, Rae Halsted, MD  ferrous sulfate 325 (65 FE) MG tablet Take 325 mg by mouth 2 (two) times daily with a meal.   Yes [provider]  hydrALAZINE (APRESOLINE) 100 MG tablet Take 1 tablet (100 mg total) by mouth 3 (three) times daily. 12/08/19  Yes Ambra Haverstick, Shaune Pascal, MD  HYDROcodone-acetaminophen (NORCO) 7.5-325 MG tablet Take 1 tablet by mouth 3 (three) times daily as needed for moderate pain. 09/19/20  Yes Cox, Kirsten, MD  insulin aspart (NOVOLOG) 100 UNIT/ML injection Inject 2-10 Units into the skin 3 (three) times daily before meals.   Yes [provider]  isosorbide mononitrate (IMDUR) 60 MG 24 hr tablet Take 1 tablet (60 mg total) by mouth daily. 06/28/20  Yes Cox, Kirsten, MD  latanoprost (XALATAN) 0.005 % ophthalmic solution Place 1 drop into both eyes at bedtime.   Yes [provider]  LEVEMIR FLEXTOUCH 100 UNIT/ML FlexPen Inject 0-50 Units into the skin daily. 09/20/20  Yes [provider]  LORazepam (ATIVAN) 0.5 MG tablet Take 1 tablet (0.5 mg total) by mouth daily as needed for anxiety. 08/22/20  Yes Cox, Kirsten, MD  magnesium oxide (MAG-OX) 400 MG tablet Take 2 tablets (800 mg total) by mouth daily. Take 2 (400 mg) daily=800 mg Patient taking differently: Take 400 mg by mouth daily. 04/09/18  Yes Shirley Friar, PA-C  nitroGLYCERIN (NITROSTAT) 0.4 MG SL tablet Place 1 tablet (0.4 mg total) under the tongue every 5 (five) minutes as needed for chest pain. 09/12/20  Yes Milford, [place], FNP  Omega-3 Fatty Acids (FISH OIL PO) Take 1 capsule by mouth daily.   Yes [provider]  OXYGEN Inhale 2 L into the lungs at bedtime.   Yes [provider]  Probiotic CAPS Take 1 capsule by mouth daily.   Yes [provider]  promethazine (PHENERGAN) 25 MG tablet Take 1 tablet (25 mg total) by mouth every 6 (six) hours as needed for nausea or vomiting. 08/05/20  Yes Cox, Kirsten, MD   torsemide (DEMADEX) 20 MG tablet Take 40 mg by mouth daily. 09/03/20  Yes [provider]    Past Medical History: Past Medical History:  Diagnosis Date  . Acquired absence of left great toe ([place])   . Acquired absence of other left toe(s) ([place])   . Anginal pain ([place])   . Atherosclerotic heart disease of native coronary artery without angina pectoris   . CAD (coronary artery disease)    08/2016 DES to RCA, also with history of LAD stenting.   . Chronic diastolic (congestive) heart failure ([place])   . Chronic kidney disease, stage 2 (mild)   . Chronic  systolic (congestive) heart failure ([place])   . Contrast dye induced nephropathy, possible 09/03/2020  . Diabetes ([place])   . Diastolic CHF ([place])   . Gastro-esophageal reflux disease without esophagitis   . Hyperlipidemia   . Hypertension   . Hypertensive heart disease with heart failure ([place])   . Hypoxemia   . Idiopathic gout, multiple sites   . Localized edema   . Low back pain   . Mixed hyperlipidemia   . Mixed incontinence   . Morbid (severe) obesity due to excess calories ([place])   . Non-ST elevation (NSTEMI) myocardial infarction ([place]) 09/03/2020  . OSA (obstructive sleep apnea)   . OSA (obstructive sleep apnea) 09/03/2020  . Other specified hypothyroidism   . Primary insomnia   . Type 2 diabetes mellitus with complication, without long-term current use of insulin ([place]) 09/03/2020  . Type 2 diabetes mellitus with diabetic nephropathy [facility])     Past Surgical History: Past Surgical History:  Procedure Laterality Date  . ABDOMINAL HYSTERECTOMY     Partial- Still has both ovaries  . CARDIOVASCULAR STRESS TEST  08/13/2014   Nuclear; Normal  . CARPAL TUNNEL RELEASE    . CATARACT EXTRACTION, BILATERAL    . CESAREAN SECTION    . CHOLECYSTECTOMY    . CORONARY ANGIOPLASTY WITH STENT PLACEMENT     3 blockages/ 1 stent  . EYE SURGERY    . RIGHT/LEFT HEART CATH AND CORONARY ANGIOGRAPHY N/A 01/07/2017   Procedure: Right/Left Heart  Cath and Coronary Angiography;  Surgeon: Larey Dresser, MD;  Location: [facility];  Service: Cardiovascular;  Laterality: N/A;  . ROTATOR CUFF REPAIR    . TOE AMPUTATION Left   . US ECHOCARDIOGRAPHY  05/2017   Normal    Family History:  Family History  Problem Relation Age of Onset  . Hypertension Father   . Coronary artery disease Mother   . Hypertension Mother   . Hyperlipidemia Mother   . Diabetes type II Mother   . Breast cancer Mother   . Lung cancer Mother   . Bipolar disorder Other   . Depression Other   . Schizophrenia Other     Social History: Social History   Socioeconomic History  . Marital status: Married    Spouse name: Not on file  . Number of children: Not on file  . Years of education: Not on file  . Highest education level: Not on file  Occupational History  . Not on file  Tobacco Use  . Smoking status: Former Research scientist (life sciences)  . Smokeless tobacco: Never Used  Vaping Use  . Vaping Use: Never used  Substance and Sexual Activity  . Alcohol use: Not Currently  . Drug use: Not Currently  . Sexual activity: Not on file  Other Topics Concern  . Not on file  Social History Narrative  . Not on file   Social Determinants of Health   Financial Resource Strain: Not on file  Food Insecurity: No Food Insecurity  . Worried About Charity fundraiser in the Last Year: Never true  . Ran Out of Food in the Last Year: Never true  Transportation Needs: No Transportation Needs  . Lack of Transportation (Medical): No  . Lack of Transportation (Non-Medical): No  Physical Activity: Insufficiently Active  . Days of Exercise per Week: 3 days  . Minutes of Exercise per Session: 40 min  Stress: Not on file  Social Connections: Not on file    Allergies:  Allergies  Allergen Reactions  .  Naproxen Hives and Rash  . Celecoxib Other (See Comments)    Stomach pain  . Aspirin Other (See Comments)    CAN NOT TAKE DUE TO ULCERS  . Glucosamine Hives, Swelling and  Other (See Comments)    Angioedema   . Metoclopramide Other (See Comments)    Facial twitching and stuttering  . Metolazone Other (See Comments)    Acute renal failure  . Shellfish-Derived Products Hives, Swelling and Other (See Comments)    Angioedema   . Tramadol Nausea And Vomiting         Objective:    Vital Signs:   Temp:  [98.3 F (36.8 C)-98.4 F (36.9 C)] 98.4 F (36.9 C) (03/17 1330) Pulse Rate:  [72-89] 88 (03/17 1330) Resp:  [12-23] 20 (03/17 1330) BP: (96-176)/(45-83) 135/60 (03/17 1330) SpO2:  [93 %-100 %] 97 % (03/17 1330) FiO2 (%):  [40 %] 40 % (03/17 0825)   There were no vitals filed for this visit.   Physical Exam     General: Appears chronically ill. No respiratory difficulty HEENT: Normal Neck: Supple. JVP difficult to assess. Carotids 2+ bilat; no bruits. No lymphadenopathy or thyromegaly appreciated. Cor: PMI nondisplaced. Regular rate & rhythm. No rubs, gallops or murmurs. Lungs: Clear Abdomen: Soft, nontender, nondistended. No hepatosplenomegaly. No bruits or masses. Good bowel sounds. Extremities: No cyanosis, clubbing, rash, R and LLE 1+ edema Neuro: Alert & oriented x 3, cranial nerves grossly intact. moves all 4 extremities w/o difficulty. Affect pleasant.   Telemetry  SR 70-80s    EKG    SR ST depression V4 and V5   Labs     Basic Metabolic Panel: Recent Labs  Lab 09/26/20 1200 09/29/20 0636  NA 139 135  K 4.7 4.0  CL 97 98  CO2 23 24  GLUCOSE 49* 293*  BUN 58* 51*  CREATININE 2.46* 2.27*  CALCIUM 9.1 8.8*    Liver Function Tests: Recent Labs  Lab 09/26/20 1200 09/29/20 0636  AST 13 20  ALT 9 20  ALKPHOS 150* 130*  BILITOT 0.3 1.1  PROT 7.5 7.7  ALBUMIN 4.1 3.3*   Recent Labs  Lab 09/29/20 0636  LIPASE 28   No results for input(s): AMMONIA in the last 168 hours.  CBC: Recent Labs  Lab 09/26/20 1200 09/29/20 0636  WBC 11.6* 15.3*  NEUTROABS 8.1*  --   HGB 9.3* 9.0*  HCT 29.9* 29.5*  MCV 84  86.5  PLT 327 288    Cardiac Enzymes: No results for input(s): CKTOTAL, CKMB, CKMBINDEX, TROPONINI in the last 168 hours.  BNP: BNP (last 3 results) Recent Labs    08/29/20 0636 09/29/20 0724  BNP 87.6 268.7*    ProBNP (last 3 results) No results for input(s): PROBNP in the last 8760 hours.   CBG: No results for input(s): GLUCAP in the last 168 hours.  Coagulation Studies: No results for input(s): LABPROT, INR in the last 72 hours.  Imaging: DG Chest 2 View  Result Date: 09/29/2020 CLINICAL DATA:  Shortness of breath EXAM: CHEST - 2 VIEW COMPARISON:  08/29/2020 FINDINGS: Cardiomegaly with diffuse interstitial opacity and cephalized blood flow. No effusion or focal density. Implant over the left hilar pulmonary arteries. IMPRESSION: CHF pattern. Electronically Signed   By: Monte Fantasia M.D.   On: 09/29/2020 07:08    Assessment/Plan   1. NSTEMI - Known coronary disease with stent mid LAD and Prox RCA. Has been on repatha/plavix  - had NSTEMI in February. Cath deferred due to  AKI. C - Developed chest pain this am. EKG changes noted ST depression V4/V5. HS 60>239  - On Heparin and Nitro drip.  - Check lipid panel.  -Plan for RHC/LHC today. Dr [name] discussed risk/benefits. The patient understands that risks included but are not limited to stroke (1 in 1000), death (1 in 22), kidney failure [usually temporary] (1 in 500), bleeding (1 in 200), allergic reaction [possibly serious] (1 in 200).  The patient understands and agrees to proceed.   2. CKD Stage IV -Recent AKI with up 3.4 during hospitalization in February.  -Todays creatinine 2.3. Follow renal function closely.  - Giving IV fluids now for cath.  - She understands risk associated cath.   3. Anemia  -hgb 9. No obvious source.  - Received feraheme 08/2020   4. Chronic Diastolic Heart Failure Echo 08/2020 EF 55-60%  CXR concerning for volume overload. Cardiomems reading was not elevated. Goal 10. Last  reading 11.  - Hold diuretics until after cath.    5. HTN Elevated. Watch after cath. Would not want BP to drop to quickly   6. Hyperlipidemia Check lipid panel  Followed in [facility]. On repatha.   7. OSA Continue CPAP nightly.   Admit to progressive care. Will need cath today. Dr [name] at bedside.    Darrick Grinder, NP 09/29/2020, 2:15 PM  Advanced Heart Failure Team Pager [phone] (M-F; 7a - 5p)  Please contact [facility] for night-coverage after hours (4p -7a ) and weekends on [email or web address]  Patient seen and examined with the above-signed Advanced Practice Provider and/or Housestaff. I personally reviewed laboratory data, imaging studies and relevant notes. I independently examined the patient and formulated the important aspects of the plan. I have edited the note to reflect any of my changes or salient points. I have personally discussed the plan with the patient and/or family.  60 y/o with CAD s/p DES LAD and RCA, diastolic HF, CKD 4, Admitted last month with NSTEMI but not cath due to AKI. CP resolved. Has not been feeling well at home with intermittent CP. Developed severe CP this am and came to ER. ECG with mild anterolateral ST depression. hstrop  60>239. Continues to have 2-3/10 CP. Creatinine stable at 2.3  General:  Weak appearing. No resp difficulty HEENT: normal Neck: supple. JVP 8-9 Carotids 2+ bilat; no bruits. No lymphadenopathy or thryomegaly appreciated. Cor: PMI nondisplaced. Regular rate & rhythm. No rubs, gallops or murmurs. Lungs: clear Abdomen: obese soft, nontender, nondistended. No hepatosplenomegaly. No bruits or masses. Good bowel sounds. Extremities: no cyanosis, clubbing, rash, tr-1+ edema Neuro: alert & orientedx3, cranial nerves grossly intact. moves all 4 extremities w/o difficulty. Affect pleasant  She is having recurrent NSTEMI. Failed medical management after previous NSTEMI. Given ongoing CP and rising trops will need cath. Discussed  risks of AKI and possible need for HD. She is willing to proceed. Would be careful with hydration as she has mild fluid overload on exam and CXR slightly wet.   Start heparin. Continue DAPT.   [name], MD  3:58 PM

## 2020-09-29 NOTE — ED Notes (Signed)
Chest pain 7/10 after 2 nitro 0.4mg  SL

## 2020-09-29 NOTE — ED Provider Notes (Signed)
[facility] EMERGENCY DEPARTMENT Provider Note   CSN: [id number] Arrival date & time: 09/29/20  4098     History No chief complaint on file.   Dana [name] is a 60 y.o. female.  HPI Dana [name] is a 60 y.o. female with history of diastolic heart failure, CAD, DES RCA/LAD 2018, DMI, htn, hyperlipidemia, PAD, and OSA.  Patient was recently admitted for NSTEMI but unable to do cardiac catheterization due to AKI.  Patient reports he awakened this morning with severe pain in her central chest she was awakened from sleep at 4:30 AM.  She reports the pain is a heavy pressure.  No radiation.  She also feels short of breath.  No recent fever, cough or lower extremity pain.  Patient denies associated abdominal pain.  She reports yesterday she did have right upper abdomen\lower chest pain.  She reports she rested in bed till about 3 PM and then felt better for the evening.  She reports she felt well at the time of going to bed last night at 10 PM.  She reports she was nauseated yesterday but not today.  No vomiting.  She reports she did have one bowel movement that looked like it might have been red although she could not tell if it was bloody.  Patient reports a distant history of ulcer.  No recent active ulcer or visualized GI bleeding.  She reports she has been taking her daily aspirin but has not taken this morning's dose.    Past Medical History:  Diagnosis Date  . Acquired absence of left great toe ([place])   . Acquired absence of other left toe(s) ([place])   . Anginal pain ([place])   . Atherosclerotic heart disease of native coronary artery without angina pectoris   . CAD (coronary artery disease)    08/2016 DES to RCA, also with history of LAD stenting.   . Chronic diastolic (congestive) heart failure ([place])   . Chronic kidney disease, stage 2 (mild)   . Chronic systolic (congestive) heart failure ([place])   . Contrast dye induced nephropathy, possible 09/03/2020  . Diabetes  ([place])   . Diastolic CHF ([place])   . Gastro-esophageal reflux disease without esophagitis   . Hyperlipidemia   . Hypertension   . Hypertensive heart disease with heart failure ([place])   . Hypoxemia   . Idiopathic gout, multiple sites   . Localized edema   . Low back pain   . Mixed hyperlipidemia   . Mixed incontinence   . Morbid (severe) obesity due to excess calories ([place])   . Non-ST elevation (NSTEMI) myocardial infarction ([place]) 09/03/2020  . OSA (obstructive sleep apnea)   . OSA (obstructive sleep apnea) 09/03/2020  . Other specified hypothyroidism   . Primary insomnia   . Type 2 diabetes mellitus with complication, without long-term current use of insulin ([place]) 09/03/2020  . Type 2 diabetes mellitus with diabetic nephropathy [facility])     Patient Active Problem List   Diagnosis Date Noted  . Non-ST elevation (NSTEMI) myocardial infarction ([place]) 09/03/2020  . Contrast dye induced nephropathy, possible 09/03/2020  . OSA (obstructive sleep apnea) 09/03/2020  . Type 2 diabetes mellitus with complication, without long-term current use of insulin ([place]) 09/03/2020  . Acute on chronic diastolic heart failure ([place]) 08/29/2020  . Need for COVID-19 vaccine 06/28/2020  . Medicare annual wellness visit, subsequent 06/13/2020  . Chronic respiratory failure with hypoxia ([place]) 03/23/2020  . Chronic gout without tophus 03/23/2020  . Pain in  both hands 03/23/2020  . Uncomplicated opioid dependence ([place]) 03/23/2020  . Depression, major, recurrent, mild ([place]) 12/21/2019  . Hypomagnesemia 12/21/2019  . Diarrhea 12/21/2019  . Lumbar back pain 10/05/2019  . Diabetic nephropathy associated with type 2 diabetes mellitus ([place]) 10/05/2019  . Hypertensive heart and renal disease with congestive heart failure ([place]) 09/18/2019  . Demand ischemia ([place])   . Diastolic heart failure ([place]) 11/07/2017  . Morbid obesity with BMI of 45.0-49.9, adult ([place]) 11/07/2017  . Coronary artery disease involving native coronary  artery of native heart without angina pectoris 12/25/2016  . Hyperlipidemia LDL goal <70 12/14/2016  . Anemia 03/23/2016  . Chronic pain syndrome 03/23/2016  . COPD (chronic obstructive pulmonary disease) ([place]) 03/23/2016  . GERD without esophagitis 03/23/2016  . On home oxygen therapy 03/23/2016  . S/P ablation of atrial fibrillation 10/04/2015  . Diabetic polyneuropathy associated with type 2 diabetes mellitus ([place]) 04/02/2014    Past Surgical History:  Procedure Laterality Date  . ABDOMINAL HYSTERECTOMY     Partial- Still has both ovaries  . CARDIOVASCULAR STRESS TEST  08/13/2014   Nuclear; Normal  . CARPAL TUNNEL RELEASE    . CATARACT EXTRACTION, BILATERAL    . CESAREAN SECTION    . CHOLECYSTECTOMY    . CORONARY ANGIOPLASTY WITH STENT PLACEMENT     3 blockages/ 1 stent  . EYE SURGERY    . RIGHT/LEFT HEART CATH AND CORONARY ANGIOGRAPHY N/A 01/07/2017   Procedure: Right/Left Heart Cath and Coronary Angiography;  Surgeon: Larey Dresser, MD;  Location: [facility];  Service: Cardiovascular;  Laterality: N/A;  . ROTATOR CUFF REPAIR    . TOE AMPUTATION Left   . US ECHOCARDIOGRAPHY  05/2017   Normal     OB History   No obstetric history on file.     Family History  Problem Relation Age of Onset  . Hypertension Father   . Coronary artery disease Mother   . Hypertension Mother   . Hyperlipidemia Mother   . Diabetes type II Mother   . Breast cancer Mother   . Lung cancer Mother   . Bipolar disorder Other   . Depression Other   . Schizophrenia Other     Social History   Tobacco Use  . Smoking status: Former Research scientist (life sciences)  . Smokeless tobacco: Never Used  Vaping Use  . Vaping Use: Never used  Substance Use Topics  . Alcohol use: Not Currently  . Drug use: Not Currently    Home Medications Prior to Admission medications   Medication Sig Start Date End Date Taking? Authorizing Provider  allopurinol (ZYLOPRIM) 100 MG tablet Take 1 tablet (100 mg total) by  mouth daily. 09/03/20   Isaiah Serge, NP  aspirin 81 MG chewable tablet Chew 81 mg by mouth daily.    [provider]  carvedilol (COREG) 12.5 MG tablet Take 1 tablet (12.5 mg total) by mouth 2 (two) times daily. 07/25/20   Bensimhon, Shaune Pascal, MD  clopidogrel (PLAVIX) 75 MG tablet Take 1 tablet (75 mg total) by mouth at bedtime. 09/12/20   Rafael Bihari, FNP  dapagliflozin propanediol (FARXIGA) 10 MG TABS tablet Take 1 tablet (10 mg total) by mouth daily before breakfast. 09/21/20   [place], Maricela Bo, FNP  EPINEPHrine 0.3 mg/0.3 mL IJ SOAJ injection Use as directed for life-threatening allergic reaction. Patient taking differently: Inject 0.3 mg into the muscle as needed for anaphylaxis. Use as directed for life-threatening allergic reaction. 06/24/18   Kennith Gain,  MD  Evolocumab (REPATHA SURECLICK) 623 MG/ML SOAJ Inject 1 Dose into the skin every 14 (fourteen) days. 09/18/19   Hilty, Nadean Corwin, MD  ezetimibe (ZETIA) 10 MG tablet TAKE 1 TABLET BY MOUTH  DAILY Patient taking differently: Take 10 mg by mouth daily. 06/29/20   Hilty, Nadean Corwin, MD  famotidine (PEPCID) 20 MG tablet Take 1 tablet (20 mg total) by mouth daily. 06/24/18   Kennith Gain, MD  ferrous sulfate 325 (65 FE) MG tablet Take 325 mg by mouth 2 (two) times daily with a meal.    [provider]  hydrALAZINE (APRESOLINE) 100 MG tablet Take 1 tablet (100 mg total) by mouth 3 (three) times daily. 12/08/19   Bensimhon, Shaune Pascal, MD  HYDROcodone-acetaminophen (NORCO) 7.5-325 MG tablet Take 1 tablet by mouth 3 (three) times daily as needed for moderate pain. 09/19/20   CoxElnita Maxwell, MD  isosorbide mononitrate (IMDUR) 60 MG 24 hr tablet Take 1 tablet (60 mg total) by mouth daily. 06/28/20   Cox, Elnita Maxwell, MD  latanoprost (XALATAN) 0.005 % ophthalmic solution Place 1 drop into both eyes at bedtime.    [provider]  LORazepam (ATIVAN) 0.5 MG tablet Take 1 tablet (0.5 mg total) by mouth  daily as needed for anxiety. 08/22/20   CoxElnita Maxwell, MD  magnesium oxide (MAG-OX) 400 MG tablet Take 2 tablets (800 mg total) by mouth daily. Take 2 (400 mg) daily=800 mg Patient taking differently: Take 400 mg by mouth daily. Take 2 (400 mg) daily=800 mg 04/09/18   Shirley Friar, PA-C  nitroGLYCERIN (NITROSTAT) 0.4 MG SL tablet Place 1 tablet (0.4 mg total) under the tongue every 5 (five) minutes as needed for chest pain. 09/12/20   [place], Maricela Bo, FNP  Omega-3 Fatty Acids (FISH OIL PO) Take 1 capsule by mouth daily.    [provider]  OXYGEN Inhale 2 L into the lungs at bedtime.    [provider]  Probiotic CAPS Take 1 capsule by mouth daily.    [provider]  promethazine (PHENERGAN) 25 MG tablet Take 1 tablet (25 mg total) by mouth every 6 (six) hours as needed for nausea or vomiting. 08/05/20   Cox, Kirsten, MD  torsemide 40 MG TABS Take 40 mg by mouth daily. 09/04/20   Isaiah Serge, NP    Allergies    Naproxen, Celecoxib, Aspirin, Glucosamine, Metoclopramide, Metolazone, Shellfish-derived products, and Tramadol  Review of Systems   Review of Systems 10 systems reviewed and negative except as per HPI Physical Exam Updated Vital Signs BP (!) 154/60   Pulse 86   Temp 98.3 F (36.8 C) (Oral)   Resp 19   SpO2 94%   Physical Exam Constitutional:      Comments: Alert and nontoxic.  Patient appears uncomfortable.  mental status clear.  HENT:     Mouth/Throat:     Pharynx: Oropharynx is clear.  Eyes:     Extraocular Movements: Extraocular movements intact.  Cardiovascular:     Rate and Rhythm: Normal rate and regular rhythm.  Pulmonary:     Comments: Mild tachypnea.  Crackles bases to midlung fields. Abdominal:     Palpations: Abdomen is soft.     Comments: Moderate tenderness right upper quadrant.  No soft tissue abnormalities.  No rashes on thoracic or abdominal skin.  Genitourinary:    Comments: Rectal: Trace brownish-yellow stool  in the vault.  No visible blood and no melena. Musculoskeletal:     Comments: Patient wearing compression  hose bilateral lower extremities.  Skin:    General: Skin is warm and dry.  Neurological:     General: No focal deficit present.     Mental Status: She is oriented to person, place, and time.     Coordination: Coordination normal.     ED Results / Procedures / Treatments   Labs (all labs ordered are listed, but only abnormal results are displayed) Labs Reviewed  BASIC METABOLIC PANEL - Abnormal; Notable for the following components:      Result Value   Glucose, Bld 293 (*)    BUN 51 (*)    Creatinine, Ser 2.27 (*)    Calcium 8.8 (*)    GFR, Estimated 25 (*)    All other components within normal limits  CBC - Abnormal; Notable for the following components:   WBC 15.3 (*)    RBC 3.41 (*)    Hemoglobin 9.0 (*)    HCT 29.5 (*)    RDW 16.4 (*)    All other components within normal limits  HEPATIC FUNCTION PANEL - Abnormal; Notable for the following components:   Albumin 3.3 (*)    Alkaline Phosphatase 130 (*)    Indirect Bilirubin 1.0 (*)    All other components within normal limits  TROPONIN I (HIGH SENSITIVITY) - Abnormal; Notable for the following components:   Troponin I (High Sensitivity) 60 (*)    All other components within normal limits  RESP PANEL BY RT-PCR (FLU A&B, COVID) ARPGX2  LIPASE, BLOOD  BRAIN NATRIURETIC PEPTIDE  LACTIC ACID, PLASMA  LACTIC ACID, PLASMA  HEPARIN LEVEL (UNFRACTIONATED)  POC OCCULT BLOOD, ED  TROPONIN I (HIGH SENSITIVITY)    EKG EKG Interpretation  Date/Time:  Thursday September 29 2020 07:54:54 EDT Ventricular Rate:  93 PR Interval:  150 QRS Duration: 98 QT Interval:  403 QTC Calculation: 502 R Axis:   80 Text Interpretation: Sinus rhythm Borderline ST depression, diffuse leads Borderline prolonged QT interval no STEMI. no sig interval change, possible subtle antlateral ST depression. poor r wave progression old. Confirmed by  Charlesetta Shanks [phone]) on 09/29/2020 8:25:24 AM   Radiology DG Chest 2 View  Result Date: 09/29/2020 CLINICAL DATA:  Shortness of breath EXAM: CHEST - 2 VIEW COMPARISON:  08/29/2020 FINDINGS: Cardiomegaly with diffuse interstitial opacity and cephalized blood flow. No effusion or focal density. Implant over the left hilar pulmonary arteries. IMPRESSION: CHF pattern. Electronically Signed   By: Monte Fantasia M.D.   On: 09/29/2020 07:08    Procedures Procedures  Angiocath insertion Performed by: Charlesetta Shanks  Consent: Verbal consent obtained. Risks and benefits: risks, benefits and alternatives were discussed Time out: Immediately prior to procedure a "time out" was called to verify the correct patient, procedure, equipment, support staff and site/side marked as required.  Preparation: Patient was prepped and draped in the usual sterile fashion.  Vein Location: rt AC  Yes Ultrasound Guided  Gauge: 20 short   Normal blood return and flush without difficulty.  Patient tolerance: Patient tolerated the procedure well with no immediate complications.  CRITICAL CARE Performed by: Charlesetta Shanks   Total critical care time: 60 minutes  Critical care time was exclusive of separately billable procedures and treating other patients.  Critical care was necessary to treat or prevent imminent or life-threatening deterioration.  Critical care was time spent personally by me on the following activities: development of treatment plan with patient and/or surrogate as well as nursing, discussions with consultants, evaluation of patient's response to treatment, examination  of patient, obtaining history from patient or surrogate, ordering and performing treatments and interventions, ordering and review of laboratory studies, ordering and review of radiographic studies, pulse oximetry and re-evaluation of patient's condition.  Medications Ordered in ED Medications  nitroGLYCERIN (NITROSTAT)  SL tablet 0.4 mg (0.4 mg Sublingual Given 09/29/20 0818)  heparin bolus via infusion 4,000 Units (has no administration in time range)  heparin ADULT infusion 100 units/mL (25000 units/262mL) (has no administration in time range)  nitroGLYCERIN 50 mg in dextrose 5 % 250 mL (0.2 mg/mL) infusion (3 mcg/min Intravenous New Bag/Given 09/29/20 0900)  morphine 4 MG/ML injection 4 mg (4 mg Intravenous Given 09/29/20 0739)  pantoprazole (PROTONIX) injection 40 mg (40 mg Intravenous Given 09/29/20 0755)  aspirin chewable tablet 324 mg (324 mg Oral Given 09/29/20 0810)  furosemide (LASIX) injection 20 mg (20 mg Intravenous Given 09/29/20 0813)    ED Course  I have reviewed the triage vital signs and the nursing notes.  Pertinent labs & imaging results that were available during my care of the patient were reviewed by me and considered in my medical decision making (see chart for details).  Clinical Course as of 09/29/20 1110  Thu Sep 29, 2020  6283 08: 02 appears consult may not have been called yet.  Card Master directly paged. D/W Angie. Will put as next to be seen. [MP]  0848 Recheck: Patient has had improvement in chest pain.  She is tolerating BiPAP well.  Still some chest pressure but tolerable.  Oxygen saturation mid 90s, blood pressures ranging systolics 151V to 616W. Will add ntg drip as tolerated [MP]  1109 Breathing is improved.  Patient still has some chest discomfort but does not feel short of breath.  Will trial off BiPAP.  She has had at least 500 cc urine output. [MP]    Clinical Course User Index [MP] Charlesetta Shanks, MD   08: 10 patient has hypoxia above baseline.  She is currently on 4 to 5 L nasal cannula oxygen to maintain oxygen saturation in mid 90s.  On baseline nighttime 2 L patient is at upper 80s to low 90s.  Chest x-ray shows vascularization consistent with CHF.  Exam is consistent with CHF with crackles bilateral lung fields.  Patient has moderate increased work of breathing.   Troponin and metabolic panel are still pending.  Clinical and diagnostic CHF, will initiate Lasix, BiPAP and nitroglycerin.  Consult has been placed for cardiology.  We will continue supportive care.  For troponin mildly elevated at 60.  Previous evaluation, significant elevation on serial troponins.  With findings consistent with ACS and known CAD, suspect NSTEMI, recurrent.  Patient is responding positively to treatment with Lasix, nitroglycerin, BiPAP and morphine.  Cardiology is consulted for urgent evaluation and admission. MDM Rules/Calculators/A&P                           Final Clinical Impression(s) / ED Diagnoses Final diagnoses:  ACS (acute coronary syndrome) ([place])  Acute congestive heart failure, unspecified heart failure type ([place])  Severe comorbid illness    Rx / DC Orders ED Discharge Orders    None       Charlesetta Shanks, MD 09/29/20 [phone]

## 2020-09-29 NOTE — Progress Notes (Signed)
Patient arrived to 36E02. Alert and oriented x4. Report feeling nausea, had received PRN Zofran prior. Patient stated feeling cold and displayed shivering with temperature 99.8 orally. Simla notified RN that patient had a spurt of SVT in [place]. On-call NP Mickel Baas notified.

## 2020-09-29 NOTE — ED Notes (Signed)
MD at bedside starting ultrasound IV

## 2020-09-29 NOTE — Progress Notes (Signed)
Pt had chills and mildly elevated temp 99.8.  She also had short burst of SVT.  I saw pt and she was feeling better. But will check blood cultures, labs and u/a.  May just be post cath stress but will check to make sure no infection.

## 2020-09-30 ENCOUNTER — Inpatient Hospital Stay (HOSPITAL_COMMUNITY): Payer: HMO

## 2020-09-30 ENCOUNTER — Encounter (HOSPITAL_COMMUNITY): Payer: Self-pay | Admitting: Cardiovascular Disease

## 2020-09-30 DIAGNOSIS — I249 Acute ischemic heart disease, unspecified: Secondary | ICD-10-CM

## 2020-09-30 DIAGNOSIS — N179 Acute kidney failure, unspecified: Secondary | ICD-10-CM | POA: Diagnosis not present

## 2020-09-30 DIAGNOSIS — R079 Chest pain, unspecified: Secondary | ICD-10-CM

## 2020-09-30 LAB — BASIC METABOLIC PANEL
Anion gap: 11 (ref 5–15)
BUN: 49 mg/dL — ABNORMAL HIGH (ref 6–20)
CO2: 22 mmol/L (ref 22–32)
Calcium: 8 mg/dL — ABNORMAL LOW (ref 8.9–10.3)
Chloride: 99 mmol/L (ref 98–111)
Creatinine, Ser: 2.56 mg/dL — ABNORMAL HIGH (ref 0.44–1.00)
GFR, Estimated: 21 mL/min — ABNORMAL LOW (ref >60)
Glucose, Bld: 325 mg/dL — ABNORMAL HIGH (ref 70–99)
Potassium: 3.9 mmol/L (ref 3.5–5.1)
Sodium: 132 mmol/L — ABNORMAL LOW (ref 135–145)

## 2020-09-30 LAB — LIPID PANEL
Cholesterol: 101 mg/dL (ref 0–200)
HDL: 42 mg/dL (ref >40)
LDL Cholesterol: 44 mg/dL (ref 0–99)
Total CHOL/HDL Ratio: 2.4 RATIO
Triglycerides: 77 mg/dL (ref <150)
VLDL: 15 mg/dL (ref 0–40)

## 2020-09-30 LAB — GLUCOSE, CAPILLARY
Glucose-Capillary: 305 mg/dL — ABNORMAL HIGH (ref 70–99)
Glucose-Capillary: 294 mg/dL — ABNORMAL HIGH (ref 70–99)
Glucose-Capillary: 335 mg/dL — ABNORMAL HIGH (ref 70–99)
Glucose-Capillary: 325 mg/dL — ABNORMAL HIGH (ref 70–99)

## 2020-09-30 LAB — URINALYSIS, ROUTINE W REFLEX MICROSCOPIC
Bilirubin Urine: NEGATIVE
Glucose, UA: 150 mg/dL — AB
Ketones, ur: NEGATIVE mg/dL
Leukocytes,Ua: NEGATIVE
Nitrite: NEGATIVE
Protein, ur: 100 mg/dL — AB
Specific Gravity, Urine: 1.018 (ref 1.005–1.030)
pH: 5 (ref 5.0–8.0)

## 2020-09-30 LAB — ECHOCARDIOGRAM COMPLETE
AR max vel: 2.78 cm2
AV Area VTI: 2.95 cm2
AV Area mean vel: 2.98 cm2
AV Mean grad: 5 mmHg
AV Peak grad: 8.1 mmHg
Ao pk vel: 1.42 m/s
Area-P 1/2: 4.71 cm2
S' Lateral: 3.1 cm
Weight: 3975.33 oz

## 2020-09-30 LAB — CBC
HCT: 25.8 % — ABNORMAL LOW (ref 36.0–46.0)
Hemoglobin: 8.3 g/dL — ABNORMAL LOW (ref 12.0–15.0)
MCH: 26.9 pg (ref 26.0–34.0)
MCHC: 32.2 g/dL (ref 30.0–36.0)
MCV: 83.8 fL (ref 80.0–100.0)
Platelets: 281 10*3/uL (ref 150–400)
RBC: 3.08 MIL/uL — ABNORMAL LOW (ref 3.87–5.11)
RDW: 16.6 % — ABNORMAL HIGH (ref 11.5–15.5)
WBC: 18 10*3/uL — ABNORMAL HIGH (ref 4.0–10.5)
nRBC: 0 % (ref 0.0–0.2)

## 2020-09-30 LAB — HEMOGLOBIN A1C
Hgb A1c MFr Bld: 6.8 % — ABNORMAL HIGH (ref 4.8–5.6)
Mean Plasma Glucose: 148.46 mg/dL

## 2020-09-30 MED ORDER — PERFLUTREN LIPID MICROSPHERE
1.0000 mL | INTRAVENOUS | Status: AC | PRN
  Administered 2020-09-30: 1 mL via INTRAVENOUS
  Filled 2020-09-30: qty 10

## 2020-09-30 MED ORDER — INSULIN ASPART 100 UNIT/ML ~~LOC~~ SOLN
0.0000 [IU] | Freq: Three times a day (TID) | SUBCUTANEOUS | Status: DC
  Administered 2020-09-30: 8 [IU] via SUBCUTANEOUS
  Administered 2020-09-30 (×2): 11 [IU] via SUBCUTANEOUS
  Administered 2020-10-01: 8 [IU] via SUBCUTANEOUS
  Administered 2020-10-01: 15 [IU] via SUBCUTANEOUS
  Administered 2020-10-01: 11 [IU] via SUBCUTANEOUS
  Administered 2020-10-02: 5 [IU] via SUBCUTANEOUS
  Administered 2020-10-02: 11 [IU] via SUBCUTANEOUS
  Administered 2020-10-02: 8 [IU] via SUBCUTANEOUS
  Administered 2020-10-03: 3 [IU] via SUBCUTANEOUS
  Administered 2020-10-04: 2 [IU] via SUBCUTANEOUS
  Administered 2020-10-05: 3 [IU] via SUBCUTANEOUS
  Administered 2020-10-05: 2 [IU] via SUBCUTANEOUS
  Administered 2020-10-05: 5 [IU] via SUBCUTANEOUS
  Administered 2020-10-06: 3 [IU] via SUBCUTANEOUS
  Administered 2020-10-06: 2 [IU] via SUBCUTANEOUS

## 2020-09-30 MED ORDER — INSULIN ASPART 100 UNIT/ML ~~LOC~~ SOLN
0.0000 [IU] | Freq: Every day | SUBCUTANEOUS | Status: DC
  Administered 2020-09-30: 4 [IU] via SUBCUTANEOUS
  Administered 2020-10-01: 3 [IU] via SUBCUTANEOUS

## 2020-09-30 MED ORDER — INSULIN DETEMIR 100 UNIT/ML ~~LOC~~ SOLN
10.0000 [IU] | Freq: Two times a day (BID) | SUBCUTANEOUS | Status: DC
  Administered 2020-09-30 – 2020-10-02 (×4): 10 [IU] via SUBCUTANEOUS
  Filled 2020-09-30 (×6): qty 0.1

## 2020-09-30 MED ORDER — INSULIN ASPART 100 UNIT/ML ~~LOC~~ SOLN
3.0000 [IU] | Freq: Three times a day (TID) | SUBCUTANEOUS | Status: DC
  Administered 2020-09-30 – 2020-10-03 (×3): 3 [IU] via SUBCUTANEOUS

## 2020-09-30 MED FILL — Verapamil HCl IV Soln 2.5 MG/ML: INTRAVENOUS | Qty: 2 | Status: AC

## 2020-09-30 MED FILL — Heparin Sod (Porcine)-NaCl IV Soln 1000 Unit/500ML-0.9%: INTRAVENOUS | Qty: 500 | Status: AC

## 2020-09-30 MED FILL — Fentanyl Citrate Preservative Free (PF) Inj 100 MCG/2ML: INTRAMUSCULAR | Qty: 2 | Status: AC

## 2020-09-30 NOTE — Progress Notes (Addendum)
Advanced Heart Failure Team Rounding Note   PCP:  [name], MD  PCP-Cardiology: [name], MD     Reason for Admission: Chest Pain, NSTEMI    HPI:    3/17-admitted for NSTEMI received PCI DES to mid circ, balloon angioplasty to OM1  Chest pain has now resolved.  Cr 2.2 > 2.5.  Ambulated in the hall today was mostly limited by msk pain did get short of breath at the end of her walk, SPO2 went down to 90% with ambulation.    Objective:    Vital Signs:   Temp:  [98.2 F (36.8 C)-99.1 F (37.3 C)] 98.5 F (36.9 C) (03/18 1151) Pulse Rate:  [0-159] 88 (03/18 1151) Resp:  [0-59] 20 (03/18 1151) BP: (103-179)/(46-85) 144/46 (03/18 1151) SpO2:  [0 %-97 %] 95 % (03/18 1202) Weight:  [112.7 kg] 112.7 kg (03/18 0432) Last BM Date: 09/28/20 Filed Weights   09/30/20 0432  Weight: 112.7 kg     Physical Exam     Cardiac: JVD difficult to assess, normal rate and rhythm, clear s1 and s2, no murmurs, rubs or gallops, ted hose on LE Pulmonary: bibasilar rales, not in distress Abdominal: non distended abdomen, soft and nontender Psych: Alert, conversant, in good spirits  Telemetry  SR 70-80s    EKG  No new ecg  Labs     Basic Metabolic Panel: Recent Labs  Lab 09/26/20 1200 09/29/20 0636 09/29/20 1955 09/30/20 0241  NA 139 135 135 132*  K 4.7 4.0 4.0 3.9  CL 97 98 102 99  CO2 23 24 23 22   GLUCOSE 49* 293* 159* 325*  BUN 58* 51* 47* 49*  CREATININE 2.46* 2.27* 1.94* 2.56*  CALCIUM 9.1 8.8* 8.3* 8.0*  MG  --   --  2.1  --     Liver Function Tests: Recent Labs  Lab 09/26/20 1200 09/29/20 0636  AST 13 20  ALT 9 20  ALKPHOS 150* 130*  BILITOT 0.3 1.1  PROT 7.5 7.7  ALBUMIN 4.1 3.3*   Recent Labs  Lab 09/29/20 0636  LIPASE 28   No results for input(s): AMMONIA in the last 168 hours.  CBC: Recent Labs  Lab 09/26/20 1200 09/29/20 0636 09/29/20 1955 09/30/20 0241  WBC 11.6* 15.3* 21.6* 18.0*  NEUTROABS 8.1*  --   --   --   HGB 9.3*  9.0* 9.1* 8.3*  HCT 29.9* 29.5* 28.7* 25.8*  MCV 84 86.5 84.7 83.8  PLT 327 288 288 281    Cardiac Enzymes: No results for input(s): CKTOTAL, CKMB, CKMBINDEX, TROPONINI in the last 168 hours.  BNP: BNP (last 3 results) Recent Labs    08/29/20 0636 09/29/20 0724  BNP 87.6 268.7*    ProBNP (last 3 results) No results for input(s): PROBNP in the last 8760 hours.   CBG: Recent Labs  Lab 09/29/20 2017 09/30/20 0737 09/30/20 1152  GLUCAP 164* 305* 294*    Coagulation Studies: No results for input(s): LABPROT, INR in the last 72 hours.  Imaging: DG Chest 2 View  Result Date: 09/29/2020 CLINICAL DATA:  Shortness of breath EXAM: CHEST - 2 VIEW COMPARISON:  08/29/2020 FINDINGS: Cardiomegaly with diffuse interstitial opacity and cephalized blood flow. No effusion or focal density. Implant over the left hilar pulmonary arteries. IMPRESSION: CHF pattern. Electronically Signed   By: Monte Fantasia M.D.   On: 09/29/2020 07:08   CARDIAC CATHETERIZATION  Result Date: 09/29/2020 1. 3 vessel CAD with moderate mid-LAD and mid-RCA stenoses, and  severe mid-LCx stenosis 2. Successful PCI of the mid-circumflex, reducing severe 90% stenosis to 0% with a 2.75x22 mm Resolute Onyx DES, provisional side branch angioplasty of the OM1 with a 2.5 mm balloon through the stent struts 3. Moderately elevated LVEDP Recommend: at least 12 motnhs DAPT with ASA and plavix, favor long-term if tolerated Total contrast = 75 cc  ECHOCARDIOGRAM COMPLETE  Result Date: 09/30/2020    ECHOCARDIOGRAM REPORT   Patient Name:   Dana [name] Date of Exam: 09/30/2020 Medical Rec #:  [id number]      Height:       65.0 in Accession #:    [id number]     Weight:       248.5 lb Date of Birth:  1960-10-24      BSA:          2.169 m Patient Age:    60 years       BP:           130/52 mmHg Patient Gender: F              HR:           80 bpm. Exam Location:  Inpatient Procedure: 2D Echo, Cardiac Doppler, Color Doppler and  Intracardiac            Opacification Agent Indications:    CHF  History:        Patient has prior history of Echocardiogram examinations, most                 recent 08/29/2020. CHF, Previous Myocardial Infarction and CAD;                 Risk Factors:Diabetes, Hypertension and Dyslipidemia.  Sonographer:    Luisa Hart RDCS Referring Phys: [id number] MICHAEL COOPER  Sonographer Comments: Suboptimal apical window, Technically difficult study due to poor echo windows and patient is morbidly obese. IMPRESSIONS  1. Left ventricular ejection fraction, by estimation, is 55 to 60%. The left ventricle has normal function. Left ventricular endocardial border not optimally defined to evaluate regional wall motion, despite the use of Definity contrast. There is mild left ventricular hypertrophy. Left ventricular diastolic parameters are consistent with Grade II diastolic dysfunction (pseudonormalization).  2. Right ventricular systolic function is normal. The right ventricular size is mildly enlarged. There is normal pulmonary artery systolic pressure. The estimated right ventricular systolic pressure is 49.6 mmHg.  3. Left atrial size was mildly dilated.  4. Right atrial size was mildly dilated.  5. The mitral valve was not well visualized. Trivial mitral valve regurgitation.  6. The aortic valve was not well visualized. Aortic valve regurgitation is not visualized. No aortic stenosis is present.  7. The inferior vena cava is normal in size with <50% respiratory variability, suggesting right atrial pressure of 8 mmHg. Comparison(s): A prior study was performed on 08/29/20. Prior images reviewed side by side. Biventricular function grossly unchanged. Image quality is worse on this exam, cannot fully evaluate mitral valve regurgitation for comparison. FINDINGS  Left Ventricle: Left ventricular ejection fraction, by estimation, is 55 to 60%. The left ventricle has normal function. Left ventricular endocardial border not optimally  defined to evaluate regional wall motion. Definity contrast agent was given IV to delineate the left ventricular endocardial borders. The left ventricular internal cavity size was normal in size. There is mild left ventricular hypertrophy. Left ventricular diastolic parameters are consistent with Grade II diastolic dysfunction (pseudonormalization). Right Ventricle: The right ventricular size is mildly enlarged. Right  vetricular wall thickness was not well visualized. Right ventricular systolic function is normal. There is normal pulmonary artery systolic pressure. The tricuspid regurgitant velocity  is 2.50 m/s, and with an assumed right atrial pressure of 8 mmHg, the estimated right ventricular systolic pressure is 34.7 mmHg. Left Atrium: Left atrial size was mildly dilated. Right Atrium: Right atrial size was mildly dilated. Pericardium: There is no evidence of pericardial effusion. Mitral Valve: The mitral valve was not well visualized. Trivial mitral valve regurgitation. Tricuspid Valve: The tricuspid valve is grossly normal. Tricuspid valve regurgitation is trivial. Aortic Valve: The aortic valve was not well visualized. Aortic valve regurgitation is not visualized. No aortic stenosis is present. Aortic valve mean gradient measures 5.0 mmHg. Aortic valve peak gradient measures 8.1 mmHg. Aortic valve area, by VTI measures 2.95 cm. Pulmonic Valve: The pulmonic valve was normal in structure. Pulmonic valve regurgitation is trivial. Aorta: The aortic root is normal in size and structure. Venous: The inferior vena cava is normal in size with less than 50% respiratory variability, suggesting right atrial pressure of 8 mmHg. IAS/Shunts: No atrial level shunt detected by color flow Doppler.  LEFT VENTRICLE PLAX 2D LVIDd:         4.70 cm  Diastology LVIDs:         3.10 cm  LV e' medial:    4.95 cm/s LV PW:         1.10 cm  LV E/e' medial:  27.5 LV IVS:        1.10 cm  LV e' lateral:   6.08 cm/s LVOT diam:     2.20 cm   LV E/e' lateral: 22.4 LV SV:         85 LV SV Index:   39 LVOT Area:     3.80 cm  RIGHT VENTRICLE TAPSE (M-mode): 2.4 cm  PULMONARY VEINS                         A Reversal Duration: 82.00 msec                         A Reversal Velocity: 28.90 cm/s                         Diastolic Velocity:  42.59 cm/s                         S/D Velocity:        0.50                         Systolic Velocity:   56.38 cm/s LEFT ATRIUM             Index       RIGHT ATRIUM           Index LA Vol (A2C):   26.8 ml 12.35 ml/m RA Area:     22.30 cm LA Vol (A4C):   36.6 ml 16.87 ml/m RA Volume:   81.80 ml  37.71 ml/m LA Biplane Vol: 33.1 ml 15.26 ml/m  AORTIC VALVE                   PULMONIC VALVE AV Area (Vmax):    2.78 cm    PV Vmax:       0.95 m/s AV Area (Vmean):   2.98 cm  PV Vmean:      77.800 cm/s AV Area (VTI):     2.95 cm    PV VTI:        0.261 m AV Vmax:           142.00 cm/s PV Peak grad:  3.6 mmHg AV Vmean:          99.200 cm/s PV Mean grad:  3.0 mmHg AV VTI:            0.289 m AV Peak Grad:      8.1 mmHg AV Mean Grad:      5.0 mmHg LVOT Vmax:         104.00 cm/s LVOT Vmean:        77.700 cm/s LVOT VTI:          0.224 m LVOT/AV VTI ratio: 0.78  AORTA Ao Root diam: 2.80 cm Ao Asc diam:  3.30 cm MITRAL VALVE                TRICUSPID VALVE MV Area (PHT): 4.71 cm     TR Peak grad:   25.0 mmHg MV Decel Time: 161 msec     TR Vmax:        250.00 cm/s MV E velocity: 136.00 cm/s MV A velocity: 87.50 cm/s   SHUNTS MV E/A ratio:  1.55         Systemic VTI:  0.22 m                             Systemic Diam: 2.20 cm Cherlynn Kaiser MD Electronically signed by Cherlynn Kaiser MD Signature Date/Time: 09/30/2020/1:39:00 PM    Final     Assessment/Plan   1. NSTEMI - Known coronary disease with stent mid LAD and Prox RCA. Has been on repatha/plavix  - had NSTEMI in February. Cath deferred due to AKI. C - Developed chest pain  EKG changes noted ST depression V4/V5. HS 60>239 compatible with NSTEMI -3/17 received PCI DES to  mid circ, balloon angioplasty to OM1  -resolution of chest pain, continue DAPT  2. CKD Stage IV -Recent AKI with up 3.4 during hospitalization in February.  -cr up slightly from yesterday but below last AKI Cr 2.2 > 2.5 -diuretics held  3. Anemia  -hgb 9. No obvious source.  - Received feraheme 08/2020   4. Chronic Diastolic Heart Failure Echo 08/2020 EF 55-60%  -CXR concerning for volume overload. Cardiomems reading was not elevated. Goal 10. Last reading 11.  -her examination today is consistent with volume overload but there is no urgent need to diurese her, could wait until tomorrow  5. HTN -Want to keep bp >130 it is about 140 today -can likely restart isordil at lower dose tomorrow  6. Hyperlipidemia Check lipid panel  Followed in [facility]. On repatha.   7. OSA Continue CPAP nightly.    Katherine Roan, MD 09/30/2020, 3:27 PM  Advanced Heart Failure Team Pager [phone] (M-F; 7a - 5p)  Please contact [facility] for night-coverage after hours (4p -7a ) and weekends on [email or web address]    Patient seen and examined with the above-signed Advanced Practice Provider and/or Housestaff. I personally reviewed laboratory data, imaging studies and relevant notes. I independently examined the patient and formulated the important aspects of the plan. I have edited the note to reflect any of my changes or salient points. I have personally discussed the plan with the patient and/or family.  Admitted with NSTEMI. Now s/p PI LCX. CP free but remains volume overloaded with some exertional hypoxia. Creatinine rising pot cath.   Echo 55-60% Personally reviewed  General:  Sitting up in bed  No resp difficulty HEENT: normal Neck: supple. JVP up  Carotids 2+ bilat; no bruits. No lymphadenopathy or thryomegaly appreciated. Cor: PMI nondisplaced. Regular rate & rhythm. No rubs, gallops or murmurs. Lungs: clear Abdomen: obese soft, nontender, nondistended. No hepatosplenomegaly. No  bruits or masses. Good bowel sounds. Extremities: no cyanosis, clubbing, rash, tr edema Neuro: alert & orientedx3, cranial nerves grossly intact. moves all 4 extremities w/o difficulty. 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 Will continue to follow. Keep MAP > 70. She is volume overloaded but will hold off on lasix for now. Continue DAPT/statin.   [name], MD  8:27 PM

## 2020-09-30 NOTE — Progress Notes (Signed)
CARDIAC REHAB PHASE I   PRE:  Rate/Rhythm: 89 SR  BP:  Supine: 146/68  Sitting:   Standing:    SaO2: 96% 2L  MODE:  Ambulation: 50 ft   POST:  Rate/Rhythm: 102 ST     SaO2: 91%RA room    During walk, as long as pleth good then sats 90% 1055-1200 Put compression socks on prior to walk. Pt walks short distances due to orthopedic issues. Pt walked 50 ft with rolling walker. Denied SOB. Monitored sats in hallway. As long as pleth was good, sats at 90%.  Would continue to check sats with walking. Pt tired by end of walk. Only walks short distances at home also. To recliner with call bell. MI education completed with pt who voiced understanding. Discussed MI restrictions, plavix with stent, NTG use, low sodium diet of 2000 mg, daily weights. Pt familiar with when to call MD with signs/symptoms of CHF. Pt has attended CRP 2 [place]. Referring again. Left oxygen off in room and 92-94%.    Graylon Good, RN BSN  09/30/2020 11:50 AM

## 2020-09-30 NOTE — Progress Notes (Signed)
Inpatient Diabetes Program Recommendations  AACE/ADA: New Consensus Statement on Inpatient Glycemic Control (2015)  Target Ranges:  Prepandial:   less than 140 mg/dL      Peak postprandial:   less than 180 mg/dL (1-2 hours)      Critically ill patients:  140 - 180 mg/dL   Lab Results  Component Value Date   GLUCAP 305 (H) 09/30/2020   HGBA1C 6.8 (H) 09/30/2020    Review of Glycemic Control  Results for Dana [name], Dana [name] (MRN [id number]) as of 09/30/2020 11:44  Ref. Range 09/29/2020 20:17 09/30/2020 07:37  Glucose-Capillary Latest Ref Range: 70 - 99 mg/dL 164 (H) 305 (H)   Diabetes history: DM 2 Outpatient Diabetes medications:  Levemir 50 units daily, Novolog 2-10 units tid with meals Current orders for Inpatient glycemic control: Novolog moderate tid with meals Inpatient Diabetes Program Recommendations:   Please add Levemir 15 units bid.   Thanks  Adah Perl, RN, BC-ADM Inpatient Diabetes Coordinator Pager [phone] (8a-5p)

## 2020-10-01 ENCOUNTER — Inpatient Hospital Stay (HOSPITAL_COMMUNITY): Payer: HMO

## 2020-10-01 DIAGNOSIS — N179 Acute kidney failure, unspecified: Secondary | ICD-10-CM | POA: Diagnosis not present

## 2020-10-01 DIAGNOSIS — R112 Nausea with vomiting, unspecified: Secondary | ICD-10-CM

## 2020-10-01 DIAGNOSIS — K3184 Gastroparesis: Secondary | ICD-10-CM

## 2020-10-01 DIAGNOSIS — E1143 Type 2 diabetes mellitus with diabetic autonomic (poly)neuropathy: Secondary | ICD-10-CM

## 2020-10-01 DIAGNOSIS — I249 Acute ischemic heart disease, unspecified: Secondary | ICD-10-CM | POA: Diagnosis not present

## 2020-10-01 LAB — CBC
HCT: 25.4 % — ABNORMAL LOW (ref 36.0–46.0)
Hemoglobin: 8.3 g/dL — ABNORMAL LOW (ref 12.0–15.0)
MCH: 26.8 pg (ref 26.0–34.0)
MCHC: 32.7 g/dL (ref 30.0–36.0)
MCV: 81.9 fL (ref 80.0–100.0)
Platelets: 318 10*3/uL (ref 150–400)
RBC: 3.1 MIL/uL — ABNORMAL LOW (ref 3.87–5.11)
RDW: 16.2 % — ABNORMAL HIGH (ref 11.5–15.5)
WBC: 20.6 10*3/uL — ABNORMAL HIGH (ref 4.0–10.5)
nRBC: 0 % (ref 0.0–0.2)

## 2020-10-01 LAB — HEPATIC FUNCTION PANEL
ALT: 16 U/L (ref 0–44)
AST: 16 U/L (ref 15–41)
Albumin: 3 g/dL — ABNORMAL LOW (ref 3.5–5.0)
Alkaline Phosphatase: 105 U/L (ref 38–126)
Bilirubin, Direct: 0.2 mg/dL (ref 0.0–0.2)
Indirect Bilirubin: 0.7 mg/dL (ref 0.3–0.9)
Total Bilirubin: 0.9 mg/dL (ref 0.3–1.2)
Total Protein: 7.4 g/dL (ref 6.5–8.1)

## 2020-10-01 LAB — BASIC METABOLIC PANEL
Anion gap: 14 (ref 5–15)
BUN: 63 mg/dL — ABNORMAL HIGH (ref 6–20)
CO2: 21 mmol/L — ABNORMAL LOW (ref 22–32)
Calcium: 8.8 mg/dL — ABNORMAL LOW (ref 8.9–10.3)
Chloride: 98 mmol/L (ref 98–111)
Creatinine, Ser: 3.94 mg/dL — ABNORMAL HIGH (ref 0.44–1.00)
GFR, Estimated: 13 mL/min — ABNORMAL LOW (ref >60)
Glucose, Bld: 270 mg/dL — ABNORMAL HIGH (ref 70–99)
Potassium: 3.9 mmol/L (ref 3.5–5.1)
Sodium: 133 mmol/L — ABNORMAL LOW (ref 135–145)

## 2020-10-01 LAB — GLUCOSE, CAPILLARY
Glucose-Capillary: 353 mg/dL — ABNORMAL HIGH (ref 70–99)
Glucose-Capillary: 289 mg/dL — ABNORMAL HIGH (ref 70–99)
Glucose-Capillary: 338 mg/dL — ABNORMAL HIGH (ref 70–99)
Glucose-Capillary: 289 mg/dL — ABNORMAL HIGH (ref 70–99)

## 2020-10-01 LAB — PLATELET INHIBITION P2Y12: Platelet Function  P2Y12: 258 [PRU] (ref 182–335)

## 2020-10-01 LAB — MAGNESIUM: Magnesium: 2.2 mg/dL (ref 1.7–2.4)

## 2020-10-01 MED ORDER — HYDRALAZINE HCL 20 MG/ML IJ SOLN
10.0000 mg | INTRAMUSCULAR | Status: DC | PRN
  Administered 2020-10-01 – 2020-10-04 (×3): 10 mg via INTRAVENOUS
  Filled 2020-10-01 (×4): qty 1

## 2020-10-01 MED ORDER — SODIUM CHLORIDE 0.9 % IV SOLN
25.0000 mg | Freq: Four times a day (QID) | INTRAVENOUS | Status: DC | PRN
  Administered 2020-10-01 – 2020-10-02 (×2): 25 mg via INTRAVENOUS
  Filled 2020-10-01 (×3): qty 1

## 2020-10-01 MED ORDER — TIROFIBAN HCL IV 12.5 MG/250 ML
0.0750 ug/kg/min | INTRAVENOUS | Status: DC
  Administered 2020-10-01: 0.075 ug/kg/min via INTRAVENOUS
  Filled 2020-10-01: qty 250

## 2020-10-01 MED ORDER — SODIUM CHLORIDE 0.9 % IV SOLN: INTRAVENOUS | Status: DC

## 2020-10-01 MED ORDER — SODIUM CHLORIDE 0.9 % IV SOLN
250.0000 mg | Freq: Three times a day (TID) | INTRAVENOUS | Status: DC
  Administered 2020-10-01 – 2020-10-04 (×7): 250 mg via INTRAVENOUS
  Filled 2020-10-01 (×11): qty 5

## 2020-10-01 MED ORDER — SODIUM CHLORIDE 0.9 % IV BOLUS
500.0000 mL | Freq: Once | INTRAVENOUS | Status: AC
  Administered 2020-10-01: 500 mL via INTRAVENOUS

## 2020-10-01 MED ORDER — MORPHINE SULFATE (PF) 2 MG/ML IV SOLN
1.0000 mg | INTRAVENOUS | Status: DC | PRN
  Administered 2020-10-01 – 2020-10-03 (×5): 1 mg via INTRAVENOUS
  Filled 2020-10-01 (×6): qty 1

## 2020-10-01 MED ORDER — TIROFIBAN (AGGRASTAT) BOLUS VIA INFUSION
25.0000 ug/kg | Freq: Once | INTRAVENOUS | Status: AC
  Administered 2020-10-01: 2835 ug via INTRAVENOUS
  Filled 2020-10-01: qty 57

## 2020-10-01 NOTE — Progress Notes (Signed)
Continued nausea and vomiting. Zofran given. Requests ice chips. Daughter into room at bedside.

## 2020-10-01 NOTE — Progress Notes (Signed)
Spoke with RN, PharmD and Dr. Jamse Arn.   Last dose of Plavix was 10pm last night but unable to hold any meds down today. Given the fact that it will likely take days for gastroparesis to resolve will stop Plavix and start aggrastat today.   [name], MD  4:12 PM

## 2020-10-01 NOTE — Progress Notes (Signed)
    Called to see pt. She has been nauseated, dry heaving this AM. Received dose of IV zofran and po phenergan w/o relief. No BM in 2 days.  +passing gas Has a hx of gastroparesis.  She is allergic to reglan.  EXAM: RRR bilat lungs clear ant Abd: soft, +faint bowel sounds, mild epigastric tenderness  Lab Results  Component Value Date   CREATININE 3.94 (H) 10/01/2020   CREATININE 2.56 (H) 09/30/2020    Lab Results  Component Value Date   HGB 8.3 (L) 09/30/2020   HGB 9.1 (L) 09/29/2020     PLAN:  Check CBC, LFTs If Hgb drops more, consider abd/pelvic CT Richardson Dopp, PA-C    10/01/2020 10:40 AM

## 2020-10-01 NOTE — Progress Notes (Addendum)
Advanced Heart Failure Team Rounding Note   PCP:  [name], MD  PCP-Cardiology: [name], MD     Reason for Admission: Chest Pain, NSTEMI    HPI:    3/17-admitted for NSTEMI received PCI DES to mid circ, balloon angioplasty to OM1  Chest pain has now resolved.  Cr 2.2 > 2.5 > 3.9  C/o severe n/v. Cannot hold anything down. Feels miserable and weak. Zofran didn;t work. Threw up oral phenergan. Asking for IV. Says she has gastroparesis but can't take Reglan due to gastroparesis.   Objective:    Vital Signs:   Temp:  [97.9 F (36.6 C)-98.2 F (36.8 C)] 98 F (36.7 C) (03/19 1146) Pulse Rate:  [78-92] 78 (03/19 1146) Resp:  [16-20] 18 (03/19 1146) BP: (120-158)/(43-76) 154/75 (03/19 1146) SpO2:  [91 %-100 %] 100 % (03/19 1146) Weight:  [113.4 kg] 113.4 kg (03/19 0539) Last BM Date: 09/28/20 Filed Weights   09/30/20 0432 10/01/20 0539  Weight: 112.7 kg 113.4 kg     Physical Exam     General:  Sitting up ion side of bed. + nauseated  No resp difficulty HEENT: normal Neck: supple. JVP 8-9 Carotids 2+ bilat; no bruits. No lymphadenopathy or thryomegaly appreciated. Cor: PMI nondisplaced. Regular rate & rhythm. No rubs, gallops or murmurs. Lungs: clear Abdomen: obese soft, nontender, nondistended. No hepatosplenomegaly. No bruits or masses. Good bowel sounds. Extremities: no cyanosis, clubbing, rash, tr edema Neuro: alert & orientedx3, cranial nerves grossly intact. moves all 4 extremities w/o difficulty. Affect pleasant  Telemetry   Sinus 70-80s Personally reviewed   Labs     Basic Metabolic Panel: Recent Labs  Lab 09/26/20 1200 09/29/20 0636 09/29/20 1955 09/30/20 0241 10/01/20 0224  NA 139 135 135 132* 133*  K 4.7 4.0 4.0 3.9 3.9  CL 97 98 102 99 98  CO2 23 24 23 22  21*  GLUCOSE 49* 293* 159* 325* 270*  BUN 58* 51* 47* 49* 63*  CREATININE 2.46* 2.27* 1.94* 2.56* 3.94*  CALCIUM 9.1 8.8* 8.3* 8.0* 8.8*  MG  --   --  2.1  --   --      Liver Function Tests: Recent Labs  Lab 09/26/20 1200 09/29/20 0636  AST 13 20  ALT 9 20  ALKPHOS 150* 130*  BILITOT 0.3 1.1  PROT 7.5 7.7  ALBUMIN 4.1 3.3*   Recent Labs  Lab 09/29/20 0636  LIPASE 28   No results for input(s): AMMONIA in the last 168 hours.  CBC: Recent Labs  Lab 09/26/20 1200 09/29/20 0636 09/29/20 1955 09/30/20 0241  WBC 11.6* 15.3* 21.6* 18.0*  NEUTROABS 8.1*  --   --   --   HGB 9.3* 9.0* 9.1* 8.3*  HCT 29.9* 29.5* 28.7* 25.8*  MCV 84 86.5 84.7 83.8  PLT 327 288 288 281    Cardiac Enzymes: No results for input(s): CKTOTAL, CKMB, CKMBINDEX, TROPONINI in the last 168 hours.  BNP: BNP (last 3 results) Recent Labs    08/29/20 0636 09/29/20 0724  BNP 87.6 268.7*    ProBNP (last 3 results) No results for input(s): PROBNP in the last 8760 hours.   CBG: Recent Labs  Lab 09/30/20 1152 09/30/20 1554 09/30/20 2116 10/01/20 0746 10/01/20 1146  GLUCAP 294* 335* 325* 353* 289*    Coagulation Studies: No results for input(s): LABPROT, INR in the last 72 hours.  Imaging: CARDIAC CATHETERIZATION  Result Date: 09/29/2020 1. 3 vessel CAD with moderate mid-LAD and mid-RCA stenoses, and  severe mid-LCx stenosis 2. Successful PCI of the mid-circumflex, reducing severe 90% stenosis to 0% with a 2.75x22 mm Resolute Onyx DES, provisional side branch angioplasty of the OM1 with a 2.5 mm balloon through the stent struts 3. Moderately elevated LVEDP Recommend: at least 12 motnhs DAPT with ASA and plavix, favor long-term if tolerated Total contrast = 75 cc  ECHOCARDIOGRAM COMPLETE  Result Date: 09/30/2020    ECHOCARDIOGRAM REPORT   Patient Name:   Dana [name] Date of Exam: 09/30/2020 Medical Rec #:  [id number]      Height:       65.0 in Accession #:    [id number]     Weight:       248.5 lb Date of Birth:  04-05-1961      BSA:          2.169 m Patient Age:    60 years       BP:           130/52 mmHg Patient Gender: F              HR:           80  bpm. Exam Location:  Inpatient Procedure: 2D Echo, Cardiac Doppler, Color Doppler and Intracardiac            Opacification Agent Indications:    CHF  History:        Patient has prior history of Echocardiogram examinations, most                 recent 08/29/2020. CHF, Previous Myocardial Infarction and CAD;                 Risk Factors:Diabetes, Hypertension and Dyslipidemia.  Sonographer:    Luisa Hart RDCS Referring Phys: [id number] MICHAEL COOPER  Sonographer Comments: Suboptimal apical window, Technically difficult study due to poor echo windows and patient is morbidly obese. IMPRESSIONS  1. Left ventricular ejection fraction, by estimation, is 55 to 60%. The left ventricle has normal function. Left ventricular endocardial border not optimally defined to evaluate regional wall motion, despite the use of Definity contrast. There is mild left ventricular hypertrophy. Left ventricular diastolic parameters are consistent with Grade II diastolic dysfunction (pseudonormalization).  2. Right ventricular systolic function is normal. The right ventricular size is mildly enlarged. There is normal pulmonary artery systolic pressure. The estimated right ventricular systolic pressure is 17.4 mmHg.  3. Left atrial size was mildly dilated.  4. Right atrial size was mildly dilated.  5. The mitral valve was not well visualized. Trivial mitral valve regurgitation.  6. The aortic valve was not well visualized. Aortic valve regurgitation is not visualized. No aortic stenosis is present.  7. The inferior vena cava is normal in size with <50% respiratory variability, suggesting right atrial pressure of 8 mmHg. Comparison(s): A prior study was performed on 08/29/20. Prior images reviewed side by side. Biventricular function grossly unchanged. Image quality is worse on this exam, cannot fully evaluate mitral valve regurgitation for comparison. FINDINGS  Left Ventricle: Left ventricular ejection fraction, by estimation, is 55 to 60%. The  left ventricle has normal function. Left ventricular endocardial border not optimally defined to evaluate regional wall motion. Definity contrast agent was given IV to delineate the left ventricular endocardial borders. The left ventricular internal cavity size was normal in size. There is mild left ventricular hypertrophy. Left ventricular diastolic parameters are consistent with Grade II diastolic dysfunction (pseudonormalization). Right Ventricle: The right ventricular size is mildly enlarged. Right  vetricular wall thickness was not well visualized. Right ventricular systolic function is normal. There is normal pulmonary artery systolic pressure. The tricuspid regurgitant velocity  is 2.50 m/s, and with an assumed right atrial pressure of 8 mmHg, the estimated right ventricular systolic pressure is 67.8 mmHg. Left Atrium: Left atrial size was mildly dilated. Right Atrium: Right atrial size was mildly dilated. Pericardium: There is no evidence of pericardial effusion. Mitral Valve: The mitral valve was not well visualized. Trivial mitral valve regurgitation. Tricuspid Valve: The tricuspid valve is grossly normal. Tricuspid valve regurgitation is trivial. Aortic Valve: The aortic valve was not well visualized. Aortic valve regurgitation is not visualized. No aortic stenosis is present. Aortic valve mean gradient measures 5.0 mmHg. Aortic valve peak gradient measures 8.1 mmHg. Aortic valve area, by VTI measures 2.95 cm. Pulmonic Valve: The pulmonic valve was normal in structure. Pulmonic valve regurgitation is trivial. Aorta: The aortic root is normal in size and structure. Venous: The inferior vena cava is normal in size with less than 50% respiratory variability, suggesting right atrial pressure of 8 mmHg. IAS/Shunts: No atrial level shunt detected by color flow Doppler.  LEFT VENTRICLE PLAX 2D LVIDd:         4.70 cm  Diastology LVIDs:         3.10 cm  LV e' medial:    4.95 cm/s LV PW:         1.10 cm  LV E/e'  medial:  27.5 LV IVS:        1.10 cm  LV e' lateral:   6.08 cm/s LVOT diam:     2.20 cm  LV E/e' lateral: 22.4 LV SV:         85 LV SV Index:   39 LVOT Area:     3.80 cm  RIGHT VENTRICLE TAPSE (M-mode): 2.4 cm  PULMONARY VEINS                         A Reversal Duration: 82.00 msec                         A Reversal Velocity: 28.90 cm/s                         Diastolic Velocity:  93.81 cm/s                         S/D Velocity:        0.50                         Systolic Velocity:   01.75 cm/s LEFT ATRIUM             Index       RIGHT ATRIUM           Index LA Vol (A2C):   26.8 ml 12.35 ml/m RA Area:     22.30 cm LA Vol (A4C):   36.6 ml 16.87 ml/m RA Volume:   81.80 ml  37.71 ml/m LA Biplane Vol: 33.1 ml 15.26 ml/m  AORTIC VALVE                   PULMONIC VALVE AV Area (Vmax):    2.78 cm    PV Vmax:       0.95 m/s AV Area (Vmean):   2.98 cm  PV Vmean:      77.800 cm/s AV Area (VTI):     2.95 cm    PV VTI:        0.261 m AV Vmax:           142.00 cm/s PV Peak grad:  3.6 mmHg AV Vmean:          99.200 cm/s PV Mean grad:  3.0 mmHg AV VTI:            0.289 m AV Peak Grad:      8.1 mmHg AV Mean Grad:      5.0 mmHg LVOT Vmax:         104.00 cm/s LVOT Vmean:        77.700 cm/s LVOT VTI:          0.224 m LVOT/AV VTI ratio: 0.78  AORTA Ao Root diam: 2.80 cm Ao Asc diam:  3.30 cm MITRAL VALVE                TRICUSPID VALVE MV Area (PHT): 4.71 cm     TR Peak grad:   25.0 mmHg MV Decel Time: 161 msec     TR Vmax:        250.00 cm/s MV E velocity: 136.00 cm/s MV A velocity: 87.50 cm/s   SHUNTS MV E/A ratio:  1.55         Systemic VTI:  0.22 m                             Systemic Diam: 2.20 cm Cherlynn Kaiser MD Electronically signed by Cherlynn Kaiser MD Signature Date/Time: 09/30/2020/1:39:00 PM    Final    Medications  . allopurinol  100 mg Oral Daily  . aspirin  81 mg Oral Daily  . clopidogrel  75 mg Oral QHS  . ezetimibe  10 mg Oral Daily  . ferrous sulfate  325 mg Oral BID WC  . hydrALAZINE  100 mg  Oral TID  . insulin aspart  0-15 Units Subcutaneous TID WC  . insulin aspart  0-5 Units Subcutaneous QHS  . insulin aspart  3 Units Subcutaneous TID WC  . insulin detemir  10 Units Subcutaneous BID  . sodium chloride flush  3 mL Intravenous Q12H  . sodium chloride flush  3 mL Intravenous Q12H  . sodium chloride flush  3 mL Intravenous Q12H    Assessment/Plan   1. NSTEMI - Known coronary disease with stent mid LAD and Prox RCA. Has been on repatha/plavix  - had NSTEMI in February. Cath deferred due to AKI. C - Developed chest pain  EKG changes noted ST depression V4/V5. HS 60>239 compatible with NSTEMI -3/17 received PCI DES to mid circ, balloon angioplasty to OM1  -no further CP continue DAPT - if unable to hold Plavix down will need IV cangrelor  2. AKI on CKD Stage IV -Recent AKI with up 3.4 during hospitalization in February.  -Creatinine continues to climb  2.2 > 2.5 > 3.9. Likely due to CIN - Suspect she may be heading toward need for temporary dialysis - Follow closely   3. Acute on chronic Diastolic Heart Failure Echo 08/2020 EF 55-60%  - diuretics on hold due to AKI. Volume status not too bad. Continue to hold diuretics particualrly with poor po intake   4. N/V - has h/o gastroparesis - doubt BUN is high enough to cause uremia  - will give IV phenergan. Check  KUB  5. OSA Continue CPAP nightly.   Will consult TR[facility] to help with complex medical issues.    [name], MD 10/01/2020, 3:27 PM  Advanced Heart Failure Team Pager [phone] (M-F; 7a - 5p)  Please contact [facility] for night-coverage after hours (4p -7a ) and weekends on [email or web address]

## 2020-10-01 NOTE — Progress Notes (Signed)
CARDIAC REHAB PHASE I     Pt was lying bed clearing in pain and holding her stomach. Asked pt what was going on and she stated she had been dry heaving all night and is in a lot of pain. Asked if her RN knew and if she needed me to go get RN. Pt stated RN knew and there was nothing anybody could do about it. Will not be able to ambulate with pt due to pain and discomfort. Reported pt's pain to RN staff.   Rick Duff, MS, CEP

## 2020-10-01 NOTE — Progress Notes (Addendum)
C/O nausea and dry heaves. Zofran and Phenergan given on previous shift without relief.  Vomiting bile appearing liquid.

## 2020-10-01 NOTE — Consult Note (Addendum)
Hospitalist Service Medical Consultation   SCHWANDA ZIMA  UTM:[id number]  DOB: 09-19-1960  DOA: 09/29/2020  PCP: [name], MD   Requesting physician: Dr. Jeffie Pollock  Reason for consultation: Intractable nausea and vomiting   History of Present Illness: Dana [name] is an 60 y.o. female with PMH significant for diabetes complicated by polyneuropathy and gastroparesis and CKD 3B who was admitted for NSTEMI.  Patient underwent cardiac catheterization given persistent symptoms and was found to have a tight stenosis of 99% in her left circumflex.  She has subsequently developed worsening kidney dysfunction secondary to contrast nephropathy.  She is being treated conservatively.  Patient was doing okay until last night when she developed nausea and vomiting.  She has been treated conservatively with p.o. Phenergan and IV Zofran without good effect.  Over the course of today she has been heaving almost continuously with only bilious vomitus.  Tried hospitalist were called to assist with intractable nausea and vomiting.  History is primarily per patient's daughter who knows her mother well and husband who was at bedside.  Patient was feeling too sick to straight.  Patient apparently has a long history of diabetic gastroparesis and has followed by a gastroenterologist as an outpatient.  She is unfortunately allergic to Reglan so is unable to take that.  She usually takes Phenergan with some relief.  Family states that they have never seen an episode of gastroparesis as bad as what she is having at present.  They note that when she has such persistent nausea and vomiting it is sometimes associated with DKA.  They note that she has been urinating although RN states that her urine output is decreased.  Have noticed no blood in her vomitus and notes it is green and bilious.   Review of Systems:  Patient unable to participate in ROS given persistent heaving and vomiting.    Past  Medical History: Past Medical History:  Diagnosis Date  . Acquired absence of left great toe ([place])   . Acquired absence of other left toe(s) ([place])   . Anginal pain ([place])   . Atherosclerotic heart disease of native coronary artery without angina pectoris   . CAD (coronary artery disease)    08/2016 DES to RCA, also with history of LAD stenting.   . Chronic diastolic (congestive) heart failure ([place])   . Chronic kidney disease, stage 2 (mild)   . Chronic systolic (congestive) heart failure ([place])   . Contrast dye induced nephropathy, possible 09/03/2020  . Diabetes ([place])   . Diastolic CHF ([place])   . Gastro-esophageal reflux disease without esophagitis   . Hyperlipidemia   . Hypertension   . Hypertensive heart disease with heart failure ([place])   . Hypoxemia   . Idiopathic gout, multiple sites   . Localized edema   . Low back pain   . Mixed hyperlipidemia   . Mixed incontinence   . Morbid (severe) obesity due to excess calories ([place])   . Non-ST elevation (NSTEMI) myocardial infarction ([place]) 09/03/2020  . OSA (obstructive sleep apnea)   . OSA (obstructive sleep apnea) 09/03/2020  . Other specified hypothyroidism   . Primary insomnia   . Type 2 diabetes mellitus with complication, without long-term current use of insulin ([place]) 09/03/2020  . Type 2 diabetes mellitus with diabetic nephropathy [facility])     Past Surgical History: Past Surgical History:  Procedure Laterality Date  . ABDOMINAL HYSTERECTOMY  Partial- Still has both ovaries  . CARDIOVASCULAR STRESS TEST  08/13/2014   Nuclear; Normal  . CARPAL TUNNEL RELEASE    . CATARACT EXTRACTION, BILATERAL    . CESAREAN SECTION    . CHOLECYSTECTOMY    . CORONARY ANGIOPLASTY WITH STENT PLACEMENT     3 blockages/ 1 stent  . CORONARY STENT INTERVENTION N/A 09/29/2020   Procedure: CORONARY STENT INTERVENTION;  Surgeon: Sherren Mocha, MD;  Location: [place] [facility];  Service: Cardiovascular;  Laterality: N/A;  CFX  . EYE SURGERY    .  LEFT HEART CATH AND CORONARY ANGIOGRAPHY N/A 09/29/2020   Procedure: LEFT HEART CATH AND CORONARY ANGIOGRAPHY;  Surgeon: Sherren Mocha, MD;  Location: [place] [facility];  Service: Cardiovascular;  Laterality: N/A;  . RIGHT/LEFT HEART CATH AND CORONARY ANGIOGRAPHY N/A 01/07/2017   Procedure: Right/Left Heart Cath and Coronary Angiography;  Surgeon: Larey Dresser, MD;  Location: [place] [facility];  Service: Cardiovascular;  Laterality: N/A;  . ROTATOR CUFF REPAIR    . TOE AMPUTATION Left   . US ECHOCARDIOGRAPHY  05/2017   Normal     Allergies:   Allergies  Allergen Reactions  . Naproxen Hives and Rash  . Celecoxib Other (See Comments)    Stomach pain  . Aspirin Other (See Comments)    CAN NOT TAKE DUE TO ULCERS (documented previously) but takes every day currently (as of 09/29/20) without reported issues  . Glucosamine Hives, Swelling and Other (See Comments)    Angioedema   . Metoclopramide Other (See Comments)    Facial twitching and stuttering  . Metolazone Other (See Comments)    Acute renal failure  . Shellfish-Derived Products Hives, Swelling and Other (See Comments)    Angioedema   . Tramadol Nausea And Vomiting          Social History:  reports that she has quit smoking. She has never used smokeless tobacco. She reports previous alcohol use. She reports previous drug use.   Family History: Family History  Problem Relation Age of Onset  . Hypertension Father   . Coronary artery disease Mother   . Hypertension Mother   . Hyperlipidemia Mother   . Diabetes type II Mother   . Breast cancer Mother   . Lung cancer Mother   . Bipolar disorder Other   . Depression Other   . Schizophrenia Other      Physical Exam: Vitals:   10/01/20 0739 10/01/20 0927 10/01/20 1146 10/01/20 1650  BP: (!) 158/46  (!) 154/75 (!) 189/87  Pulse: 88  78 94  Resp:  20 18 18   Temp:  98.2 F (36.8 C) 98 F (36.7 C) 97.6 F (36.4 C)  TempSrc:   Oral Oral  SpO2: 91%  100% 98%   Weight:      Height:        Constitutional: Unwell appearing female sitting on side of bed dry heaving. CVS: Distant heart sound Respiratory: CTA GI: Patient has bowel sounds.  Abdomen is obese, not distended and is soft without significant tenderness. LE: Trace edema bilaterally Neuro:  grossly nonfocal.   Data reviewed:  I have personally reviewed following labs and imaging studies Labs:  CBC: Recent Labs  Lab 09/26/20 1200 09/29/20 0636 09/29/20 1955 09/30/20 0241  WBC 11.6* 15.3* 21.6* 18.0*  NEUTROABS 8.1*  --   --   --   HGB 9.3* 9.0* 9.1* 8.3*  HCT 29.9* 29.5* 28.7* 25.8*  MCV 84 86.5 84.7 83.8  PLT 327  288 288 510    Basic Metabolic Panel: Recent Labs  Lab 09/26/20 1200 09/29/20 0636 09/29/20 1955 09/30/20 0241 10/01/20 0224  NA 139 135 135 132* 133*  K 4.7 4.0 4.0 3.9 3.9  CL 97 98 102 99 98  CO2 23 24 23 22  21*  GLUCOSE 49* 293* 159* 325* 270*  BUN 58* 51* 47* 49* 63*  CREATININE 2.46* 2.27* 1.94* 2.56* 3.94*  CALCIUM 9.1 8.8* 8.3* 8.0* 8.8*  MG  --   --  2.1  --   --    GFR Estimated Creatinine Clearance: 19.8 mL/min (A) (by C-G formula based on SCr of 3.94 mg/dL (H)). Liver Function Tests: Recent Labs  Lab 09/26/20 1200 09/29/20 0636  AST 13 20  ALT 9 20  ALKPHOS 150* 130*  BILITOT 0.3 1.1  PROT 7.5 7.7  ALBUMIN 4.1 3.3*   Recent Labs  Lab 09/29/20 0636  LIPASE 28   No results for input(s): AMMONIA in the last 168 hours. Coagulation profile No results for input(s): INR, PROTIME in the last 168 hours.  Cardiac Enzymes: No results for input(s): CKTOTAL, CKMB, CKMBINDEX, TROPONINI in the last 168 hours. BNP: Invalid input(s): POCBNP CBG: Recent Labs  Lab 09/30/20 1554 09/30/20 2116 10/01/20 0746 10/01/20 1146 10/01/20 1648  GLUCAP 335* 325* 353* 289* 338*   D-Dimer No results for input(s): DDIMER in the last 72 hours. Hgb A1c Recent Labs    09/30/20 0241  HGBA1C 6.8*   Lipid Profile Recent Labs    09/30/20 0241   CHOL 101  HDL 42  LDLCALC 44  TRIG 77  CHOLHDL 2.4   Thyroid function studies No results for input(s): TSH, T4TOTAL, T3FREE, THYROIDAB in the last 72 hours.  Invalid input(s): FREET3 Anemia work up No results for input(s): VITAMINB12, FOLATE, FERRITIN, TIBC, IRON, RETICCTPCT in the last 72 hours. Urinalysis    Component Value Date/Time   COLORURINE YELLOW 09/30/2020 0022   APPEARANCEUR HAZY (A) 09/30/2020 0022   LABSPEC 1.018 09/30/2020 0022   PHURINE 5.0 09/30/2020 0022   GLUCOSEU 150 (A) 09/30/2020 0022   HGBUR SMALL (A) 09/30/2020 0022   BILIRUBINUR NEGATIVE 09/30/2020 0022   KETONESUR NEGATIVE 09/30/2020 0022   PROTEINUR 100 (A) 09/30/2020 0022   NITRITE NEGATIVE 09/30/2020 0022   LEUKOCYTESUR NEGATIVE 09/30/2020 0022     Sepsis Labs Invalid input(s): PROCALCITONIN,  WBC,  LACTICIDVEN Microbiology Recent Results (from the past 240 hour(s))  Resp Panel by RT-PCR (Flu A&B, Covid) Nasopharyngeal Swab     Status: None   Collection Time: 09/29/20  9:20 AM   Specimen: Nasopharyngeal Swab; Nasopharyngeal(NP) swabs in vial transport medium  Result Value Ref Range Status   SARS Coronavirus 2 by RT PCR NEGATIVE NEGATIVE Final    Comment: (NOTE) SARS-CoV-2 target nucleic acids are NOT DETECTED.  The SARS-CoV-2 RNA is generally detectable in upper respiratory specimens during the acute phase of infection. The lowest concentration of SARS-CoV-2 viral copies this assay can detect is 138 copies/mL. A negative result does not preclude SARS-Cov-2 infection and should not be used as the sole basis for treatment or other patient management decisions. A negative result may occur with  improper specimen collection/handling, submission of specimen other than nasopharyngeal swab, presence of viral mutation(s) within the areas targeted by this assay, and inadequate number of viral copies(<138 copies/mL). A negative result must be combined with clinical observations, patient history,  and epidemiological information. The expected result is Negative.  Fact Sheet for Patients:  EntrepreneurPulse.com.au  Fact Sheet  for Healthcare Providers:  IncredibleEmployment.be  This test is no t yet approved or cleared by the [place] and  has been authorized for detection and/or diagnosis of SARS-CoV-2 by FDA under an Emergency Use Authorization (EUA). This EUA will remain  in effect (meaning this test can be used) for the duration of the COVID-19 declaration under Section 564(b)(1) of the Act, 21 U.S.C.section 360bbb-3(b)(1), unless the authorization is terminated  or revoked sooner.       Influenza A by PCR NEGATIVE NEGATIVE Final   Influenza B by PCR NEGATIVE NEGATIVE Final    Comment: (NOTE) The Xpert Xpress SARS-CoV-2/FLU/RSV plus assay is intended as an aid in the diagnosis of influenza from Nasopharyngeal swab specimens and should not be used as a sole basis for treatment. Nasal washings and aspirates are unacceptable for Xpert Xpress SARS-CoV-2/FLU/RSV testing.  Fact Sheet for Patients: EntrepreneurPulse.com.au  Fact Sheet for Healthcare Providers: IncredibleEmployment.be  This test is not yet approved or cleared by the [place] FDA and has been authorized for detection and/or diagnosis of SARS-CoV-2 by FDA under an Emergency Use Authorization (EUA). This EUA will remain in effect (meaning this test can be used) for the duration of the COVID-19 declaration under Section 564(b)(1) of the Act, 21 U.S.C. section 360bbb-3(b)(1), unless the authorization is terminated or revoked.  Performed at [facility], [place] [address] [place], [place] [address]   Culture, blood (Routine X 2) w Reflex to ID Panel     Status: None (Preliminary result)   Collection Time: 09/29/20  7:55 PM   Specimen: BLOOD RIGHT HAND  Result Value Ref Range Status   Specimen Description BLOOD RIGHT  HAND  Final   Special Requests   Final    BOTTLES DRAWN AEROBIC AND ANAEROBIC Blood Culture adequate volume   Culture   Final    NO GROWTH 1 DAY Performed at [facility], [place] [address] [place], [place] [address]    Report Status PENDING  Incomplete  Culture, blood (Routine X 2) w Reflex to ID Panel     Status: None (Preliminary result)   Collection Time: 09/29/20  7:55 PM   Specimen: BLOOD LEFT HAND  Result Value Ref Range Status   Specimen Description BLOOD LEFT HAND  Final   Special Requests   Final    BOTTLES DRAWN AEROBIC AND ANAEROBIC Blood Culture adequate volume   Culture   Final    NO GROWTH 1 DAY Performed at [facility], [place] [address], [place], [place] [address]    Report Status PENDING  Incomplete       Inpatient Medications:   Scheduled Meds: . allopurinol  100 mg Oral Daily  . aspirin  81 mg Oral Daily  . ezetimibe  10 mg Oral Daily  . ferrous sulfate  325 mg Oral BID WC  . insulin aspart  0-15 Units Subcutaneous TID WC  . insulin aspart  0-5 Units Subcutaneous QHS  . insulin aspart  3 Units Subcutaneous TID WC  . insulin detemir  10 Units Subcutaneous BID  . sodium chloride flush  3 mL Intravenous Q12H  . sodium chloride flush  3 mL Intravenous Q12H  . sodium chloride flush  3 mL Intravenous Q12H  . tirofiban  25 mcg/kg Intravenous Once   Continuous Infusions: . sodium chloride 50 mL/hr at 09/29/20 1405  . sodium chloride    . sodium chloride    . promethazine (PHENERGAN) injection 25 mg (10/01/20 1651)  .  tirofiban       Radiological Exams on Admission: DG Abd Portable 1V  Result Date: 10/01/2020 CLINICAL DATA:  Vomiting for 2 days. EXAM: PORTABLE ABDOMEN - 1 VIEW COMPARISON:  Abdominal radiograph dated 02/13/2016. FINDINGS: The bowel gas pattern is normal. No radio-opaque calculi. Degenerative changes are seen in both hips. IMPRESSION: Nonobstructive bowel gas pattern. Electronically Signed   By: Zerita Boers M.D.   On: 10/01/2020  16:27   ECHOCARDIOGRAM COMPLETE  Result Date: 09/30/2020    ECHOCARDIOGRAM REPORT   Patient Name:   KINGA CASSAR Date of Exam: 09/30/2020 Medical Rec #:  [id number]      Height:       65.0 in Accession #:    [id number]     Weight:       248.5 lb Date of Birth:  1961-03-21      BSA:          2.169 m Patient Age:    49 years       BP:           130/52 mmHg Patient Gender: F              HR:           80 bpm. Exam Location:  Inpatient Procedure: 2D Echo, Cardiac Doppler, Color Doppler and Intracardiac            Opacification Agent Indications:    CHF  History:        Patient has prior history of Echocardiogram examinations, most                 recent 08/29/2020. CHF, Previous Myocardial Infarction and CAD;                 Risk Factors:Diabetes, Hypertension and Dyslipidemia.  Sonographer:    Luisa Hart RDCS Referring Phys: 61 MICHAEL COOPER  Sonographer Comments: Suboptimal apical window, Technically difficult study due to poor echo windows and patient is morbidly obese. IMPRESSIONS  1. Left ventricular ejection fraction, by estimation, is 55 to 60%. The left ventricle has normal function. Left ventricular endocardial border not optimally defined to evaluate regional wall motion, despite the use of Definity contrast. There is mild left ventricular hypertrophy. Left ventricular diastolic parameters are consistent with Grade II diastolic dysfunction (pseudonormalization).  2. Right ventricular systolic function is normal. The right ventricular size is mildly enlarged. There is normal pulmonary artery systolic pressure. The estimated right ventricular systolic pressure is 75.1 mmHg.  3. Left atrial size was mildly dilated.  4. Right atrial size was mildly dilated.  5. The mitral valve was not well visualized. Trivial mitral valve regurgitation.  6. The aortic valve was not well visualized. Aortic valve regurgitation is not visualized. No aortic stenosis is present.  7. The inferior vena cava is normal in size  with <50% respiratory variability, suggesting right atrial pressure of 8 mmHg. Comparison(s): A prior study was performed on 08/29/20. Prior images reviewed side by side. Biventricular function grossly unchanged. Image quality is worse on this exam, cannot fully evaluate mitral valve regurgitation for comparison. FINDINGS  Left Ventricle: Left ventricular ejection fraction, by estimation, is 55 to 60%. The left ventricle has normal function. Left ventricular endocardial border not optimally defined to evaluate regional wall motion. Definity contrast agent was given IV to delineate the left ventricular endocardial borders. The left ventricular internal cavity size was normal in size. There is mild left ventricular hypertrophy. Left ventricular diastolic parameters are consistent with Grade  II diastolic dysfunction (pseudonormalization). Right Ventricle: The right ventricular size is mildly enlarged. Right vetricular wall thickness was not well visualized. Right ventricular systolic function is normal. There is normal pulmonary artery systolic pressure. The tricuspid regurgitant velocity  is 2.50 m/s, and with an assumed right atrial pressure of 8 mmHg, the estimated right ventricular systolic pressure is 29.7 mmHg. Left Atrium: Left atrial size was mildly dilated. Right Atrium: Right atrial size was mildly dilated. Pericardium: There is no evidence of pericardial effusion. Mitral Valve: The mitral valve was not well visualized. Trivial mitral valve regurgitation. Tricuspid Valve: The tricuspid valve is grossly normal. Tricuspid valve regurgitation is trivial. Aortic Valve: The aortic valve was not well visualized. Aortic valve regurgitation is not visualized. No aortic stenosis is present. Aortic valve mean gradient measures 5.0 mmHg. Aortic valve peak gradient measures 8.1 mmHg. Aortic valve area, by VTI measures 2.95 cm. Pulmonic Valve: The pulmonic valve was normal in structure. Pulmonic valve regurgitation is  trivial. Aorta: The aortic root is normal in size and structure. Venous: The inferior vena cava is normal in size with less than 50% respiratory variability, suggesting right atrial pressure of 8 mmHg. IAS/Shunts: No atrial level shunt detected by color flow Doppler.  LEFT VENTRICLE PLAX 2D LVIDd:         4.70 cm  Diastology LVIDs:         3.10 cm  LV e' medial:    4.95 cm/s LV PW:         1.10 cm  LV E/e' medial:  27.5 LV IVS:        1.10 cm  LV e' lateral:   6.08 cm/s LVOT diam:     2.20 cm  LV E/e' lateral: 22.4 LV SV:         85 LV SV Index:   39 LVOT Area:     3.80 cm  RIGHT VENTRICLE TAPSE (M-mode): 2.4 cm  PULMONARY VEINS                         A Reversal Duration: 82.00 msec                         A Reversal Velocity: 28.90 cm/s                         Diastolic Velocity:  98.92 cm/s                         S/D Velocity:        0.50                         Systolic Velocity:   11.94 cm/s LEFT ATRIUM             Index       RIGHT ATRIUM           Index LA Vol (A2C):   26.8 ml 12.35 ml/m RA Area:     22.30 cm LA Vol (A4C):   36.6 ml 16.87 ml/m RA Volume:   81.80 ml  37.71 ml/m LA Biplane Vol: 33.1 ml 15.26 ml/m  AORTIC VALVE                   PULMONIC VALVE AV Area (Vmax):    2.78 cm    PV Vmax:  0.95 m/s AV Area (Vmean):   2.98 cm    PV Vmean:      77.800 cm/s AV Area (VTI):     2.95 cm    PV VTI:        0.261 m AV Vmax:           142.00 cm/s PV Peak grad:  3.6 mmHg AV Vmean:          99.200 cm/s PV Mean grad:  3.0 mmHg AV VTI:            0.289 m AV Peak Grad:      8.1 mmHg AV Mean Grad:      5.0 mmHg LVOT Vmax:         104.00 cm/s LVOT Vmean:        77.700 cm/s LVOT VTI:          0.224 m LVOT/AV VTI ratio: 0.78  AORTA Ao Root diam: 2.80 cm Ao Asc diam:  3.30 cm MITRAL VALVE                TRICUSPID VALVE MV Area (PHT): 4.71 cm     TR Peak grad:   25.0 mmHg MV Decel Time: 161 msec     TR Vmax:        250.00 cm/s MV E velocity: 136.00 cm/s MV A velocity: 87.50 cm/s   SHUNTS MV E/A ratio:  1.55          Systemic VTI:  0.22 m                             Systemic Diam: 2.20 cm Cherlynn Kaiser MD Electronically signed by Cherlynn Kaiser MD Signature Date/Time: 09/30/2020/1:39:00 PM    Final     Impression/Recommendations Active Problems:   Chronic pain syndrome   COPD (chronic obstructive pulmonary disease) (HCC)   Diabetic polyneuropathy associated with type 2 diabetes mellitus (HCC)   Chest pain   ACS (acute coronary syndrome) ([place])  Patient with known diabetic gastroparesis, acute kidney injury secondary to contrast nephropathy and somewhat brittle diabetes has intractable nausea and vomiting.  Differential diagnosis includes gastroparesis, DKA and uremia, as well as possible gallbladder disease or viral gastroenteritis which is less likely.  Intractable nausea and vomiting with increasing metabolic acidosis I suspect N/V is from gastroparesis primarily although renal dysfunction and some dehydration are likely contributory.  I do not believe she has frank DKA although her anion gap is rising from 10-14 today.  Some of this rise is also likely secondary to her worsening kidney dysfunction. Unfortunately patient is allergic to Reglan, and p.o. Phenergan has not been sufficient. Plan: Patient will need bowel rest, will make patient n.p.o.   Will discontinue all oral meds except for aspirin.  Zetia is discontinued as well. Discussed with Dr. Tempie Hoist who will change Plavix to Aggrastat IV. Given Reglan allergy and no cisapride on formulary, will start patient on IV erythromycin 3 mg/kg every 8 hours for promotility agent.  Would discontinue erythromycin once her N/V is under control given known tachyphylaxis. Change p.o. Phenergan to IV Phenergan as needed. Hydrate with NS to 500 cc bolus then 150 cc an hour x24 hours Repeat LFTs to follow alkaline phosphatase which had increased from 110-150 NB: Will need to follow QTC on erythromycin.  Repeat EKG now and tomorrow  morning.  Hyperglycemia with rising anion gap As noted above I do not believe she is in DKA  as I suspect increasing anion gap is likely multifactorial and primarily secondary to worsening kidney function. We will continue present insulin management given n.p.o. status. Once patient is eating again she may benefit from increased detemir. Diabetes coordinator consult placed.  Acute kidney injury secondary to contrast nephropathy Will increase hydration given rising creatinine and decreasing urine output. 500 cc NS bolus as noted above followed by 150 cc an hour x24 hours. Hydration status will need to be reassessed tomorrow especially since her diuretic is being held. EF is 55 to 60% so she should be able to tolerate this. Follow urine output closely, repeat creatinine in the morning.   Thank you for this consultation.  Our [facility] hospitalist team will follow the patient with you.     Vashti Hey M.D. [facility] 10/01/2020, 5:13 PM Please page Via [email or web address] for questions

## 2020-10-02 ENCOUNTER — Encounter: Payer: Self-pay | Admitting: Family Medicine

## 2020-10-02 DIAGNOSIS — N184 Chronic kidney disease, stage 4 (severe): Secondary | ICD-10-CM | POA: Diagnosis not present

## 2020-10-02 DIAGNOSIS — I214 Non-ST elevation (NSTEMI) myocardial infarction: Secondary | ICD-10-CM | POA: Diagnosis not present

## 2020-10-02 DIAGNOSIS — N179 Acute kidney failure, unspecified: Secondary | ICD-10-CM | POA: Diagnosis not present

## 2020-10-02 DIAGNOSIS — R112 Nausea with vomiting, unspecified: Secondary | ICD-10-CM | POA: Diagnosis not present

## 2020-10-02 DIAGNOSIS — I509 Heart failure, unspecified: Secondary | ICD-10-CM

## 2020-10-02 LAB — GLUCOSE, CAPILLARY
Glucose-Capillary: 332 mg/dL — ABNORMAL HIGH (ref 70–99)
Glucose-Capillary: 269 mg/dL — ABNORMAL HIGH (ref 70–99)
Glucose-Capillary: 249 mg/dL — ABNORMAL HIGH (ref 70–99)
Glucose-Capillary: 166 mg/dL — ABNORMAL HIGH (ref 70–99)

## 2020-10-02 LAB — BASIC METABOLIC PANEL
Anion gap: 16 — ABNORMAL HIGH (ref 5–15)
BUN: 76 mg/dL — ABNORMAL HIGH (ref 6–20)
CO2: 19 mmol/L — ABNORMAL LOW (ref 22–32)
Calcium: 8.7 mg/dL — ABNORMAL LOW (ref 8.9–10.3)
Chloride: 98 mmol/L (ref 98–111)
Creatinine, Ser: 4.88 mg/dL — ABNORMAL HIGH (ref 0.44–1.00)
GFR, Estimated: 10 mL/min — ABNORMAL LOW (ref >60)
Glucose, Bld: 302 mg/dL — ABNORMAL HIGH (ref 70–99)
Potassium: 4.1 mmol/L (ref 3.5–5.1)
Sodium: 133 mmol/L — ABNORMAL LOW (ref 135–145)

## 2020-10-02 LAB — HEMOGLOBIN AND HEMATOCRIT, BLOOD
HCT: 22.2 % — ABNORMAL LOW (ref 36.0–46.0)
HCT: 23.1 % — ABNORMAL LOW (ref 36.0–46.0)
Hemoglobin: 7 g/dL — ABNORMAL LOW (ref 12.0–15.0)
Hemoglobin: 7.5 g/dL — ABNORMAL LOW (ref 12.0–15.0)

## 2020-10-02 LAB — PREPARE RBC (CROSSMATCH)

## 2020-10-02 MED ORDER — SODIUM CHLORIDE 0.9 % IV SOLN
12.5000 mg | Freq: Three times a day (TID) | INTRAVENOUS | Status: AC
  Administered 2020-10-02 – 2020-10-04 (×5): 12.5 mg via INTRAVENOUS
  Filled 2020-10-02 (×6): qty 0.5

## 2020-10-02 MED ORDER — SALINE SPRAY 0.65 % NA SOLN
1.0000 | NASAL | Status: DC | PRN
  Administered 2020-10-02 – 2020-10-03 (×2): 1 via NASAL
  Filled 2020-10-02: qty 44

## 2020-10-02 MED ORDER — INSULIN DETEMIR 100 UNIT/ML ~~LOC~~ SOLN
14.0000 [IU] | Freq: Two times a day (BID) | SUBCUTANEOUS | Status: DC
  Filled 2020-10-02: qty 0.14

## 2020-10-02 MED ORDER — CLOPIDOGREL BISULFATE 75 MG PO TABS
75.0000 mg | ORAL_TABLET | Freq: Every day | ORAL | Status: DC
  Administered 2020-10-03 – 2020-10-06 (×4): 75 mg via ORAL
  Filled 2020-10-02 (×4): qty 1

## 2020-10-02 MED ORDER — TIROFIBAN HCL IV 12.5 MG/250 ML
0.0750 ug/kg/min | INTRAVENOUS | Status: DC
  Administered 2020-10-02 (×2): 0.075 ug/kg/min via INTRAVENOUS
  Filled 2020-10-02 (×2): qty 250

## 2020-10-02 MED ORDER — ALLOPURINOL 100 MG PO TABS: 100.0000 mg | ORAL_TABLET | Freq: Every day | ORAL | 0 refills | Status: DC

## 2020-10-02 MED ORDER — LORAZEPAM 2 MG/ML IJ SOLN
0.5000 mg | Freq: Four times a day (QID) | INTRAMUSCULAR | Status: DC | PRN
  Administered 2020-10-02 – 2020-10-05 (×4): 0.5 mg via INTRAVENOUS
  Filled 2020-10-02 (×4): qty 1

## 2020-10-02 MED ORDER — INSULIN DETEMIR 100 UNIT/ML ~~LOC~~ SOLN
18.0000 [IU] | Freq: Two times a day (BID) | SUBCUTANEOUS | Status: DC
  Administered 2020-10-02 – 2020-10-03 (×2): 18 [IU] via SUBCUTANEOUS
  Filled 2020-10-02 (×3): qty 0.18

## 2020-10-02 MED ORDER — TRANEXAMIC ACID FOR EPISTAXIS
500.0000 mg | Freq: Once | TOPICAL | Status: AC
  Administered 2020-10-02: 500 mg via TOPICAL
  Filled 2020-10-02: qty 10

## 2020-10-02 MED ORDER — OXYMETAZOLINE HCL 0.05 % NA SOLN
1.0000 | Freq: Two times a day (BID) | NASAL | Status: DC
  Administered 2020-10-02: 1 via NASAL
  Filled 2020-10-02: qty 30

## 2020-10-02 MED ORDER — OXYMETAZOLINE HCL 0.05 % NA SOLN
1.0000 | Freq: Four times a day (QID) | NASAL | Status: AC
  Administered 2020-10-02 – 2020-10-03 (×4): 1 via NASAL
  Filled 2020-10-02: qty 15

## 2020-10-02 MED ORDER — CLOPIDOGREL BISULFATE 75 MG PO TABS
300.0000 mg | ORAL_TABLET | ORAL | Status: AC
  Administered 2020-10-02: 300 mg via ORAL
  Filled 2020-10-02: qty 4

## 2020-10-02 MED ORDER — SODIUM CHLORIDE 0.9 % IV SOLN: INTRAVENOUS | Status: DC

## 2020-10-02 NOTE — Progress Notes (Signed)
Afrin nasal spray given bilateral nares. Ice applied to bridge of nose for continued bleeding.

## 2020-10-02 NOTE — Progress Notes (Signed)
Inpatient Diabetes Program Recommendations  AACE/ADA: New Consensus Statement on Inpatient Glycemic Control (2015)  Target Ranges:  Prepandial:   less than 140 mg/dL      Peak postprandial:   less than 180 mg/dL (1-2 hours)      Critically ill patients:  140 - 180 mg/dL   Lab Results  Component Value Date   GLUCAP 332 (H) 10/02/2020   HGBA1C 6.8 (H) 09/30/2020    Review of Glycemic Control Results for NETASHA, WEHRLI (MRN [id number]) as of 10/02/2020 10:12  Ref. Range 10/01/2020 07:46 10/01/2020 11:46 10/01/2020 16:48 10/01/2020 21:35 10/02/2020 07:34  Glucose-Capillary Latest Ref Range: 70 - 99 mg/dL 353 (H) 289 (H) 338 (H) 289 (H) 332 (H)   Diabetes history: DM Outpatient Diabetes medications: Farxiga 10 mg daily, Levemir 50 units daily, Novolog 2-10 units TID Current orders for Inpatient glycemic control: Levemir 10 units BID, Novolog 0-15 units TID & 0-5 units QHS, Novolog 3 units TID with meals  Inpatient Diabetes Program Recommendations:     Levemir 20 units BID   Will continue to follow while inpatient.  Thank you, Reche Dixon, RN, BSN Diabetes Coordinator Inpatient Diabetes Program [phone] (team pager from 8a-5p)

## 2020-10-02 NOTE — Progress Notes (Addendum)
PROGRESS NOTE    Dana [name]  VOH:[id number] DOB: Dec 15, 1960 DOA: 09/29/2020 PCP: [name], MD  Brief Narrative: Chronically ill 60 year old female with history of type 2 diabetes mellitus with gastroparesis and peripheral neuropathy, morbid obesity, BMI of 41, stage IIIb chronic kidney disease, CAD was admitted with NSTEMI, she was recently hospitalized last month for same and treated medically at the time, on 3/17 underwent PCI-DES to mid circumflex and balloon angioplasty to OM1. -Subsequently developed severe nausea and vomiting and worsening renal failure -TRH consulted for medical management   Assessment & Plan:   NSTEMI -Known coronary disease with multiple prior stents -Second admission with NSTEMI in 1 month, on 3/17 underwent left heart cath, PCI-DES to mid circumflex and angioplasty to OM1 -Due to recurrent nausea and vomiting she was started on Aggrastat in the interim instead of Plavix until p.o. intake improves -Early this morning with profuse nosebleeds, appears to be subsiding now  -Add low-dose beta-blocker tomorrow  Nausea and vomiting Leukocytosis -Suspect this is multifactorial secondary to gastroparesis, NSTEMi etc -Abdominal exam is benign, s/p previous cholecystectomy -Symptoms appear to be improving, continue scheduled erythromycin today, will change Phenergan to TID [facility] for 1day,  -Trial of clears today, cut down IV fluids -Do not suspect DKA clinically, metabolic acidosis is secondary to AKI/CKD -Remains afebrile and nontoxic, check differential on CBC, if leukocytosis does not improve will obtain CT abdomen without IV contrast -Continue supportive care  AKI on CKD 4 -Baseline creatinine around 2-2.5, creatinine now trending up to 4.8, urine output -900 overnight -Suspect contrast nephropathy -Urine output is good for now -will need Nephrology consult if creatinine continues to worsen -Cut down IV fluids, stop in 6hours if no further  vomiting  Chronic diastolic CHF -Appears slightly volume overloaded, cut down IV fluids -May need diuretics soon  Uncontrolled type 2 diabetes mellitus -Increase Levemir slowly, continue NovoLog premeal and sliding scale -Could be at risk of hypoglycemia if kidney function worsens  Obesity OSA -Continue CPAP nightly  Hypertension -stable, start beta-blocker tomorrow if p.o. intake is stable   DVT prophylaxis: SCDs, currently on Aggrastat infusion for stent Code Status: Full code   Antimicrobials:    Subjective: -Multiple episodes of vomiting yesterday, none so far today, reports some nausea, had a bowel movement yesterday -Early this morning with epistaxis  Objective: Vitals:   10/01/20 2351 10/02/20 0350 10/02/20 0600 10/02/20 0735  BP: (!) 161/76 (!) 167/78  (!) 126/101  Pulse: 97 98  93  Resp: 19 20  18   Temp: 98 F (36.7 C) 98.2 F (36.8 C)    TempSrc: Oral Oral    SpO2: 92% 95%    Weight:   113.2 kg   Height:        Intake/Output Summary (Last 24 hours) at 10/02/2020 1024 Last data filed at 10/02/2020 6948 Gross per 24 hour  Intake 1109 ml  Output 950 ml  Net 159 ml   Filed Weights   09/30/20 0432 10/01/20 0539 10/02/20 0600  Weight: 112.7 kg 113.4 kg 113.2 kg    Examination:  General exam: Pleasant obese female sitting up in bed, uncomfortable appearing HEENT: Neck obese unable to assess JVD CVS: S1-S2, regular rate rhythm Lungs: Decreased breath sounds the bases Abdomen: Obese, soft, nontender, nondistended, bowel sounds present Extremities: Trace edema, compression stockings on Psychiatry:  Mood & affect appropriate.     Data Reviewed:   CBC: Recent Labs  Lab 09/26/20 1200 09/29/20 0636 09/29/20 1955 09/30/20 0241 10/01/20 2300  WBC 11.6* 15.3* 21.6* 18.0* 20.6*  NEUTROABS 8.1*  --   --   --   --   HGB 9.3* 9.0* 9.1* 8.3* 8.3*  HCT 29.9* 29.5* 28.7* 25.8* 25.4*  MCV 84 86.5 84.7 83.8 81.9  PLT 327 288 288 281 740   Basic  Metabolic Panel: Recent Labs  Lab 09/29/20 0636 09/29/20 1955 09/30/20 0241 10/01/20 0224 10/01/20 2300 10/02/20 0201  NA 135 135 132* 133*  --  133*  K 4.0 4.0 3.9 3.9  --  4.1  CL 98 102 99 98  --  98  CO2 24 23 22  21*  --  19*  GLUCOSE 293* 159* 325* 270*  --  302*  BUN 51* 47* 49* 63*  --  76*  CREATININE 2.27* 1.94* 2.56* 3.94*  --  4.88*  CALCIUM 8.8* 8.3* 8.0* 8.8*  --  8.7*  MG  --  2.1  --   --  2.2  --    GFR: Estimated Creatinine Clearance: 16 mL/min (A) (by C-G formula based on SCr of 4.88 mg/dL (H)). Liver Function Tests: Recent Labs  Lab 09/26/20 1200 09/29/20 0636 10/01/20 2300  AST 13 20 16   ALT 9 20 16   ALKPHOS 150* 130* 105  BILITOT 0.3 1.1 0.9  PROT 7.5 7.7 7.4  ALBUMIN 4.1 3.3* 3.0*   Recent Labs  Lab 09/29/20 0636  LIPASE 28   No results for input(s): AMMONIA in the last 168 hours. Coagulation Profile: No results for input(s): INR, PROTIME in the last 168 hours. Cardiac Enzymes: No results for input(s): CKTOTAL, CKMB, CKMBINDEX, TROPONINI in the last 168 hours. BNP (last 3 results) No results for input(s): PROBNP in the last 8760 hours. HbA1C: Recent Labs    09/30/20 0241  HGBA1C 6.8*   CBG: Recent Labs  Lab 10/01/20 0746 10/01/20 1146 10/01/20 1648 10/01/20 2135 10/02/20 0734  GLUCAP 353* 289* 338* 289* 332*   Lipid Profile: Recent Labs    09/30/20 0241  CHOL 101  HDL 42  LDLCALC 44  TRIG 77  CHOLHDL 2.4   Thyroid Function Tests: No results for input(s): TSH, T4TOTAL, FREET4, T3FREE, THYROIDAB in the last 72 hours. Anemia Panel: No results for input(s): VITAMINB12, FOLATE, FERRITIN, TIBC, IRON, RETICCTPCT in the last 72 hours. Urine analysis:    Component Value Date/Time   COLORURINE YELLOW 09/30/2020 0022   APPEARANCEUR HAZY (A) 09/30/2020 0022   LABSPEC 1.018 09/30/2020 0022   PHURINE 5.0 09/30/2020 0022   GLUCOSEU 150 (A) 09/30/2020 0022   HGBUR SMALL (A) 09/30/2020 0022   BILIRUBINUR NEGATIVE 09/30/2020  0022   KETONESUR NEGATIVE 09/30/2020 0022   PROTEINUR 100 (A) 09/30/2020 0022   NITRITE NEGATIVE 09/30/2020 0022   LEUKOCYTESUR NEGATIVE 09/30/2020 0022   Sepsis Labs: @LABRCNTIP (procalcitonin:4,lacticidven:4)  ) Recent Results (from the past 240 hour(s))  Resp Panel by RT-PCR (Flu A&B, Covid) Nasopharyngeal Swab     Status: None   Collection Time: 09/29/20  9:20 AM   Specimen: Nasopharyngeal Swab; Nasopharyngeal(NP) swabs in vial transport medium  Result Value Ref Range Status   SARS Coronavirus 2 by RT PCR NEGATIVE NEGATIVE Final    Comment: (NOTE) SARS-CoV-2 target nucleic acids are NOT DETECTED.  The SARS-CoV-2 RNA is generally detectable in upper respiratory specimens during the acute phase of infection. The lowest concentration of SARS-CoV-2 viral copies this assay can detect is 138 copies/mL. A negative result does not preclude SARS-Cov-2 infection and should not be used as the sole basis for treatment or other  patient management decisions. A negative result may occur with  improper specimen collection/handling, submission of specimen other than nasopharyngeal swab, presence of viral mutation(s) within the areas targeted by this assay, and inadequate number of viral copies(<138 copies/mL). A negative result must be combined with clinical observations, patient history, and epidemiological information. The expected result is Negative.  Fact Sheet for Patients:  EntrepreneurPulse.com.au  Fact Sheet for Healthcare Providers:  IncredibleEmployment.be  This test is no t yet approved or cleared by the [place] FDA and  has been authorized for detection and/or diagnosis of SARS-CoV-2 by FDA under an Emergency Use Authorization (EUA). This EUA will remain  in effect (meaning this test can be used) for the duration of the COVID-19 declaration under Section 564(b)(1) of the Act, 21 U.S.C.section 360bbb-3(b)(1), unless the authorization is  terminated  or revoked sooner.       Influenza A by PCR NEGATIVE NEGATIVE Final   Influenza B by PCR NEGATIVE NEGATIVE Final    Comment: (NOTE) The Xpert Xpress SARS-CoV-2/FLU/RSV plus assay is intended as an aid in the diagnosis of influenza from Nasopharyngeal swab specimens and should not be used as a sole basis for treatment. Nasal washings and aspirates are unacceptable for Xpert Xpress SARS-CoV-2/FLU/RSV testing.  Fact Sheet for Patients: EntrepreneurPulse.com.au  Fact Sheet for Healthcare Providers: IncredibleEmployment.be  This test is not yet approved or cleared by the [place] FDA and has been authorized for detection and/or diagnosis of SARS-CoV-2 by FDA under an Emergency Use Authorization (EUA). This EUA will remain in effect (meaning this test can be used) for the duration of the COVID-19 declaration under Section 564(b)(1) of the Act, 21 U.S.C. section 360bbb-3(b)(1), unless the authorization is terminated or revoked.  Performed at [facility], [place] [address], [place], [place] [address]   Culture, blood (Routine X 2) w Reflex to ID Panel     Status: None (Preliminary result)   Collection Time: 09/29/20  7:55 PM   Specimen: BLOOD RIGHT HAND  Result Value Ref Range Status   Specimen Description BLOOD RIGHT HAND  Final   Special Requests   Final    BOTTLES DRAWN AEROBIC AND ANAEROBIC Blood Culture adequate volume   Culture   Final    NO GROWTH 1 DAY Performed at [facility], [place] [address], [place], [place] [address]    Report Status PENDING  Incomplete  Culture, blood (Routine X 2) w Reflex to ID Panel     Status: None (Preliminary result)   Collection Time: 09/29/20  7:55 PM   Specimen: BLOOD LEFT HAND  Result Value Ref Range Status   Specimen Description BLOOD LEFT HAND  Final   Special Requests   Final    BOTTLES DRAWN AEROBIC AND ANAEROBIC Blood Culture adequate volume   Culture   Final    NO  GROWTH 1 DAY Performed at [facility], [place] [address], [place], [place] [address]    Report Status PENDING  Incomplete         Radiology Studies: DG Abd Portable 1V  Result Date: 10/01/2020 CLINICAL DATA:  Vomiting for 2 days. EXAM: PORTABLE ABDOMEN - 1 VIEW COMPARISON:  Abdominal radiograph dated 02/13/2016. FINDINGS: The bowel gas pattern is normal. No radio-opaque calculi. Degenerative changes are seen in both hips. IMPRESSION: Nonobstructive bowel gas pattern. Electronically Signed   By: Zerita Boers M.D.   On: 10/01/2020 16:27   Scheduled Meds: . aspirin  81 mg Oral Daily  . insulin  aspart  0-15 Units Subcutaneous TID WC  . insulin aspart  0-5 Units Subcutaneous QHS  . insulin aspart  3 Units Subcutaneous TID WC  . insulin detemir  10 Units Subcutaneous BID  . oxymetazoline  1 spray Each Nare BID  . sodium chloride flush  3 mL Intravenous Q12H  . sodium chloride flush  3 mL Intravenous Q12H   Continuous Infusions: . sodium chloride    . sodium chloride 75 mL/hr at 10/02/20 1008  . erythromycin Stopped (10/02/20 0341)  . promethazine (PHENERGAN) injection    . tirofiban 0.075 mcg/kg/min (10/02/20 0818)     LOS: 3 days    Time spent: 57min  Domenic Polite, MD [facility]  10/02/2020, 10:24 AM

## 2020-10-02 NOTE — Progress Notes (Signed)
Patient home CPAP is at bedside. Patient may not use it tonight due to nausea.

## 2020-10-02 NOTE — Progress Notes (Signed)
ANTICOAGULATION CONSULT NOTE - Initial Consult  Pharmacy Consult for Tirofiban Indication: chest pain/ACS - s/p DES to LCx 3/22  Allergies  Allergen Reactions  . Naproxen Hives and Rash  . Celecoxib Other (See Comments)    Stomach pain  . Aspirin Other (See Comments)    CAN NOT TAKE DUE TO ULCERS (documented previously) but takes every day currently (as of 09/29/20) without reported issues  . Glucosamine Hives, Swelling and Other (See Comments)    Angioedema   . Metoclopramide Other (See Comments)    Facial twitching and stuttering  . Metolazone Other (See Comments)    Acute renal failure  . Shellfish-Derived Products Hives, Swelling and Other (See Comments)    Angioedema   . Tramadol Nausea And Vomiting         Patient Measurements: Height: 5\' 5"  (165.1 cm) Weight: 113.2 kg (249 lb 9 oz) IBW/kg (Calculated) : 57   Vital Signs: Temp: 98.2 F (36.8 C) (03/20 0350) Temp Source: Oral (03/20 0350) BP: 126/101 (03/20 0735) Pulse Rate: 93 (03/20 0735)  Labs: Recent Labs    09/29/20 0920 09/29/20 1955 09/29/20 1955 09/30/20 0241 10/01/20 0224 10/01/20 2300 10/02/20 0201  HGB  --  9.1*   < > 8.3*  --  8.3*  --   HCT  --  28.7*  --  25.8*  --  25.4*  --   PLT  --  288  --  281  --  318  --   CREATININE  --  1.94*   < > 2.56* 3.94*  --  4.88*  TROPONINIHS 239*  --   --   --   --   --   --    < > = values in this interval not displayed.    Estimated Creatinine Clearance: 16 mL/min (A) (by C-G formula based on SCr of 4.88 mg/dL (H)).   Medical History: Past Medical History:  Diagnosis Date  . Acquired absence of left great toe ([place])   . Acquired absence of other left toe(s) ([place])   . Anginal pain ([place])   . Atherosclerotic heart disease of native coronary artery without angina pectoris   . CAD (coronary artery disease)    08/2016 DES to RCA, also with history of LAD stenting.   . Chronic diastolic (congestive) heart failure ([place])   . Chronic kidney disease,  stage 2 (mild)   . Chronic systolic (congestive) heart failure ([place])   . Contrast dye induced nephropathy, possible 09/03/2020  . Diabetes ([place])   . Diastolic CHF ([place])   . Gastro-esophageal reflux disease without esophagitis   . Hyperlipidemia   . Hypertension   . Hypertensive heart disease with heart failure ([place])   . Hypoxemia   . Idiopathic gout, multiple sites   . Localized edema   . Low back pain   . Mixed hyperlipidemia   . Mixed incontinence   . Morbid (severe) obesity due to excess calories ([place])   . Non-ST elevation (NSTEMI) myocardial infarction ([place]) 09/03/2020  . OSA (obstructive sleep apnea)   . OSA (obstructive sleep apnea) 09/03/2020  . Other specified hypothyroidism   . Primary insomnia   . Type 2 diabetes mellitus with complication, without long-term current use of insulin ([place]) 09/03/2020  . Type 2 diabetes mellitus with diabetic nephropathy [facility])       Assessment: 59yof admitted with CP s/p DES to LCx 3/17 and started on asa and clopidogrel.  3/19 N/V with Hx gastroparesis.  Holding po meds including clopidogrel.  Will use Tirofiban as antiplatelet to cover stent while NPO.   Recheck Hgb post initiation  last pm remains stable at 8.   This am complain of nose bleeds. Will try saline nasal spray, afrin x 2 doses and could consider topical TXA soaked gauze pads if continues.    Goal of Therapy:  Monitor platelets by anticoagulation protocol: Yes   Plan:  Tirofiban 28mcg/kg bolus x1 then tirofiban drip rate 0.075 mcg/kg/min until can take po  Daily CBC and Bmet Monitor s/s bleeding    Bonnita Nasuti Pharm.D. CPP, BCPS Clinical Pharmacist [phone] 10/02/2020 7:56 AM

## 2020-10-02 NOTE — Progress Notes (Signed)
Pt having nose bleed since approx 4:40 am. Richardson Dopp, PA notified. Pt on aggrastat IV d/t n/v and unable to tolerated PO plavix at this time. Pharmacist notified as well. Orders received.

## 2020-10-02 NOTE — Progress Notes (Signed)
Advanced Heart Failure Team Rounding Note   PCP:  [name], MD  PCP-Cardiology: [name], MD     Reason for Admission: Chest Pain, NSTEMI    HPI:    3/17-admitted for NSTEMI received PCI DES to mid circ, balloon angioplasty to OM1  No further CP. No SOB. On aggrastat due to poor po intake. Having epistaxis. Started afrin   Creatinine worse but seems to be plateauing  2.2 > 2.5 > 3.9 > 4.9. Urine output good. Weight stable.   Developed severe gastroparesis yesterday. TRH helping to manage with IVF, etc (thanks!) Improving.    Objective:    Vital Signs:   Temp:  [97.6 F (36.4 C)-98.2 F (36.8 C)] 98.2 F (36.8 C) (03/20 0350) Pulse Rate:  [78-98] 93 (03/20 0735) Resp:  [18-20] 18 (03/20 0735) BP: (126-189)/(65-101) 159/71 (03/20 0951) SpO2:  [87 %-100 %] 95 % (03/20 0350) Weight:  [113.2 kg] 113.2 kg (03/20 0600) Last BM Date: 09/28/20 Filed Weights   09/30/20 0432 10/01/20 0539 10/02/20 0600  Weight: 112.7 kg 113.4 kg 113.2 kg     Physical Exam     General:  Sitting up in bed. No resp difficulty HEENT: normal Neck: supple. JVP up  Carotids 2+ bilat; no bruits. No lymphadenopathy or thryomegaly appreciated. Cor: PMI nondisplaced. Regular rate & rhythm. No rubs, gallops or murmurs. Lungs: clear Abdomen: obese soft, nontender, nondistended. No hepatosplenomegaly. No bruits or masses. Good bowel sounds. Extremities: no cyanosis, clubbing, rash, edema Neuro: alert & orientedx3, cranial nerves grossly intact. moves all 4 extremities w/o difficulty. Affect pleasant   Telemetry   Sinus 90s Personally reviewed  Labs     Basic Metabolic Panel: Recent Labs  Lab 09/29/20 0636 09/29/20 1955 09/30/20 0241 10/01/20 0224 10/01/20 2300 10/02/20 0201  NA 135 135 132* 133*  --  133*  K 4.0 4.0 3.9 3.9  --  4.1  CL 98 102 99 98  --  98  CO2 24 23 22  21*  --  19*  GLUCOSE 293* 159* 325* 270*  --  302*  BUN 51* 47* 49* 63*  --  76*  CREATININE  2.27* 1.94* 2.56* 3.94*  --  4.88*  CALCIUM 8.8* 8.3* 8.0* 8.8*  --  8.7*  MG  --  2.1  --   --  2.2  --     Liver Function Tests: Recent Labs  Lab 09/26/20 1200 09/29/20 0636 10/01/20 2300  AST 13 20 16   ALT 9 20 16   ALKPHOS 150* 130* 105  BILITOT 0.3 1.1 0.9  PROT 7.5 7.7 7.4  ALBUMIN 4.1 3.3* 3.0*   Recent Labs  Lab 09/29/20 0636  LIPASE 28   No results for input(s): AMMONIA in the last 168 hours.  CBC: Recent Labs  Lab 09/26/20 1200 09/29/20 0636 09/29/20 1955 09/30/20 0241 10/01/20 2300  WBC 11.6* 15.3* 21.6* 18.0* 20.6*  NEUTROABS 8.1*  --   --   --   --   HGB 9.3* 9.0* 9.1* 8.3* 8.3*  HCT 29.9* 29.5* 28.7* 25.8* 25.4*  MCV 84 86.5 84.7 83.8 81.9  PLT 327 288 288 281 318    Cardiac Enzymes: No results for input(s): CKTOTAL, CKMB, CKMBINDEX, TROPONINI in the last 168 hours.  BNP: BNP (last 3 results) Recent Labs    08/29/20 0636 09/29/20 0724  BNP 87.6 268.7*    ProBNP (last 3 results) No results for input(s): PROBNP in the last 8760 hours.   CBG: Recent Labs  Lab 10/01/20 0746 10/01/20 1146 10/01/20 1648 10/01/20 2135 10/02/20 0734  GLUCAP 353* 289* 338* 289* 332*    Coagulation Studies: No results for input(s): LABPROT, INR in the last 72 hours.  Imaging: DG Abd Portable 1V  Result Date: 10/01/2020 CLINICAL DATA:  Vomiting for 2 days. EXAM: PORTABLE ABDOMEN - 1 VIEW COMPARISON:  Abdominal radiograph dated 02/13/2016. FINDINGS: The bowel gas pattern is normal. No radio-opaque calculi. Degenerative changes are seen in both hips. IMPRESSION: Nonobstructive bowel gas pattern. Electronically Signed   By: Zerita Boers M.D.   On: 10/01/2020 16:27   Medications  . aspirin  81 mg Oral Daily  . insulin aspart  0-15 Units Subcutaneous TID WC  . insulin aspart  0-5 Units Subcutaneous QHS  . insulin aspart  3 Units Subcutaneous TID WC  . insulin detemir  18 Units Subcutaneous BID  . oxymetazoline  1 spray Each Nare QID  . sodium chloride  flush  3 mL Intravenous Q12H  . sodium chloride flush  3 mL Intravenous Q12H  . tranexamic acid  500 mg Topical Once    Assessment/Plan   1. NSTEMI - Known coronary disease with stent mid LAD and Prox RCA. Has been on repatha/plavix  - had NSTEMI in February. Cath deferred due to AKI. C - Developed chest pain  EKG changes noted ST depression V4/V5. HS 60>239 compatible with NSTEMI -3/17 received PCI DES to mid circ, balloon angioplasty to OM1  -no further CP continue DAPT - Unable to hold Plavix down with gastroparesis. Switched to aggrastat. Hopefully cans witch back tomorrow  2. AKI on CKD Stage IV -Recent AKI with up 3.4 during hospitalization in February.  -Creatinine continues to climb  2.2 > 2.5 > 3.9 > 4.9 Likely due to CIN - Now plateauing. Hopefully will turn the corner soon - No indication for HD yet so will not involve Nephrology at this point. - Follow closely   3. Acute on chronic Diastolic Heart Failure- Echo 08/2020 EF 55-60%  - diuretics on hold due to AKI. Receiving IVF for gastroparesis - IVF decreased. Can give 1 dose of IV lasix as needed  4. N/V due to severe gastroparesis - doubt BUN is high enough to cause uremia  - management per TRH.   5. OSA Continue CPAP nightly.   6. Epistaxis.  - continue PRN Afrin - Can try topical TXA. Discussed dosing with PharmD personally.  7. DM2 - sugars high - continue SSI  [name], MD 10/02/2020, 11:01 AM  Advanced Heart Failure Team Pager [phone] (M-F; 7a - 5p)  Please contact [facility] for night-coverage after hours (4p -7a ) and weekends on [email or web address]

## 2020-10-03 ENCOUNTER — Inpatient Hospital Stay (HOSPITAL_COMMUNITY): Payer: HMO

## 2020-10-03 ENCOUNTER — Encounter (HOSPITAL_COMMUNITY): Payer: HMO

## 2020-10-03 DIAGNOSIS — I214 Non-ST elevation (NSTEMI) myocardial infarction: Secondary | ICD-10-CM | POA: Diagnosis not present

## 2020-10-03 DIAGNOSIS — N184 Chronic kidney disease, stage 4 (severe): Secondary | ICD-10-CM | POA: Diagnosis not present

## 2020-10-03 DIAGNOSIS — R112 Nausea with vomiting, unspecified: Secondary | ICD-10-CM | POA: Diagnosis not present

## 2020-10-03 DIAGNOSIS — N179 Acute kidney failure, unspecified: Secondary | ICD-10-CM | POA: Diagnosis not present

## 2020-10-03 LAB — BASIC METABOLIC PANEL
Anion gap: 11 (ref 5–15)
BUN: 98 mg/dL — ABNORMAL HIGH (ref 6–20)
CO2: 21 mmol/L — ABNORMAL LOW (ref 22–32)
Calcium: 8.1 mg/dL — ABNORMAL LOW (ref 8.9–10.3)
Chloride: 103 mmol/L (ref 98–111)
Creatinine, Ser: 5.48 mg/dL — ABNORMAL HIGH (ref 0.44–1.00)
GFR, Estimated: 9 mL/min — ABNORMAL LOW (ref >60)
Glucose, Bld: 169 mg/dL — ABNORMAL HIGH (ref 70–99)
Potassium: 4.4 mmol/L (ref 3.5–5.1)
Sodium: 135 mmol/L (ref 135–145)

## 2020-10-03 LAB — RETICULOCYTES
Immature Retic Fract: 24.2 % — ABNORMAL HIGH (ref 2.3–15.9)
RBC.: 2.45 MIL/uL — ABNORMAL LOW (ref 3.87–5.11)
Retic Count, Absolute: 46.6 K/uL (ref 19.0–186.0)
Retic Ct Pct: 1.9 % (ref 0.4–3.1)

## 2020-10-03 LAB — CBC WITH DIFFERENTIAL/PLATELET
Abs Immature Granulocytes: 0.17 10*3/uL — ABNORMAL HIGH (ref 0.00–0.07)
Basophils Absolute: 0.1 10*3/uL (ref 0.0–0.1)
Basophils Relative: 0 %
Eosinophils Absolute: 0 10*3/uL (ref 0.0–0.5)
Eosinophils Relative: 0 %
HCT: 21.9 % — ABNORMAL LOW (ref 36.0–46.0)
Hemoglobin: 7.1 g/dL — ABNORMAL LOW (ref 12.0–15.0)
Immature Granulocytes: 1 %
Lymphocytes Relative: 7 %
Lymphs Abs: 1.7 10*3/uL (ref 0.7–4.0)
MCH: 27.4 pg (ref 26.0–34.0)
MCHC: 32.4 g/dL (ref 30.0–36.0)
MCV: 84.6 fL (ref 80.0–100.0)
Monocytes Absolute: 1.9 10*3/uL — ABNORMAL HIGH (ref 0.1–1.0)
Monocytes Relative: 8 %
Neutro Abs: 20.4 10*3/uL — ABNORMAL HIGH (ref 1.7–7.7)
Neutrophils Relative %: 84 %
Platelets: 287 10*3/uL (ref 150–400)
RBC: 2.59 MIL/uL — ABNORMAL LOW (ref 3.87–5.11)
RDW: 16.1 % — ABNORMAL HIGH (ref 11.5–15.5)
WBC: 24.3 10*3/uL — ABNORMAL HIGH (ref 4.0–10.5)
nRBC: 0 % (ref 0.0–0.2)

## 2020-10-03 LAB — URINALYSIS, MICROSCOPIC (REFLEX): RBC / HPF: NONE SEEN RBC/hpf (ref 0–5)

## 2020-10-03 LAB — FOLATE: Folate: 10 ng/mL (ref >5.9)

## 2020-10-03 LAB — GLUCOSE, CAPILLARY
Glucose-Capillary: 152 mg/dL — ABNORMAL HIGH (ref 70–99)
Glucose-Capillary: 100 mg/dL — ABNORMAL HIGH (ref 70–99)
Glucose-Capillary: 106 mg/dL — ABNORMAL HIGH (ref 70–99)
Glucose-Capillary: 82 mg/dL (ref 70–99)

## 2020-10-03 LAB — PREPARE RBC (CROSSMATCH)

## 2020-10-03 LAB — CBC
HCT: 20.3 % — ABNORMAL LOW (ref 36.0–46.0)
Hemoglobin: 6.7 g/dL — CL (ref 12.0–15.0)
MCH: 27.6 pg (ref 26.0–34.0)
MCHC: 33 g/dL (ref 30.0–36.0)
MCV: 83.5 fL (ref 80.0–100.0)
Platelets: 290 10*3/uL (ref 150–400)
RBC: 2.43 MIL/uL — ABNORMAL LOW (ref 3.87–5.11)
RDW: 16.2 % — ABNORMAL HIGH (ref 11.5–15.5)
WBC: 19.8 10*3/uL — ABNORMAL HIGH (ref 4.0–10.5)
nRBC: 0 % (ref 0.0–0.2)

## 2020-10-03 LAB — URINALYSIS, ROUTINE W REFLEX MICROSCOPIC
Bilirubin Urine: NEGATIVE
Glucose, UA: 250 mg/dL — AB
Hgb urine dipstick: NEGATIVE
Ketones, ur: NEGATIVE mg/dL
Leukocytes,Ua: NEGATIVE
Nitrite: NEGATIVE
Protein, ur: 100 mg/dL — AB
Specific Gravity, Urine: 1.025 (ref 1.005–1.030)
pH: 5 (ref 5.0–8.0)

## 2020-10-03 LAB — VITAMIN B12: Vitamin B-12: 196 pg/mL (ref 180–914)

## 2020-10-03 LAB — IRON AND TIBC
Iron: 33 ug/dL (ref 28–170)
Saturation Ratios: 15 % (ref 10.4–31.8)
TIBC: 224 ug/dL — ABNORMAL LOW (ref 250–450)
UIBC: 191 ug/dL

## 2020-10-03 LAB — FERRITIN: Ferritin: 287 ng/mL (ref 11–307)

## 2020-10-03 MED ORDER — INSULIN DETEMIR 100 UNIT/ML ~~LOC~~ SOLN
14.0000 [IU] | Freq: Two times a day (BID) | SUBCUTANEOUS | Status: DC
  Administered 2020-10-04: 14 [IU] via SUBCUTANEOUS
  Filled 2020-10-03 (×3): qty 0.14

## 2020-10-03 MED ORDER — FENTANYL CITRATE (PF) 100 MCG/2ML IJ SOLN
12.5000 ug | INTRAMUSCULAR | Status: DC | PRN
Start: 2020-10-03 — End: 2020-10-05
  Administered 2020-10-03 – 2020-10-05 (×6): 12.5 ug via INTRAVENOUS
  Filled 2020-10-03 (×7): qty 2

## 2020-10-03 MED ORDER — MENTHOL 3 MG MT LOZG
1.0000 | LOZENGE | OROMUCOSAL | Status: DC | PRN
  Administered 2020-10-03 – 2020-10-04 (×3): 3 mg via ORAL
  Filled 2020-10-03 (×2): qty 9

## 2020-10-03 MED ORDER — FUROSEMIDE 10 MG/ML IJ SOLN
80.0000 mg | Freq: Once | INTRAMUSCULAR | Status: AC
  Administered 2020-10-03: 80 mg via INTRAVENOUS
  Filled 2020-10-03: qty 8

## 2020-10-03 MED ORDER — SODIUM CHLORIDE 0.9% IV SOLUTION: Freq: Once | INTRAVENOUS | Status: AC

## 2020-10-03 MED ORDER — GUAIFENESIN-DM 100-10 MG/5ML PO SYRP
5.0000 mL | ORAL_SOLUTION | ORAL | Status: DC | PRN
  Administered 2020-10-03 (×2): 5 mL via ORAL
  Filled 2020-10-03 (×2): qty 5

## 2020-10-03 NOTE — Progress Notes (Signed)
Patient has home CPAP at bedside and needs no assistance with application at this time. RT will continue to monitor.

## 2020-10-03 NOTE — Progress Notes (Signed)
CARDIAC REHAB PHASE I   PRE:  Rate/Rhythm: 83 SR  BP:  Supine: 154/54  Sitting:   Standing:    SaO2: 100% 3L  MODE:  Ambulation: 110 ft   POST:  Rate/Rhythm: 104 ST  BP:  Supine:   Sitting: 156/58  Standing:    SaO2: 97% 3L 1000-1050 Pt has not walked in hall since Friday so we followed with recliner. Pt walked total of 110 ft on 3L with gait belt use, rolling walker and asst x 1. One asst to follow with recliner. Pt had to take two standing rest breaks and she sat once to rest. Sats were 97% on 3L in hallway and HR at 104. No CP. Some SOB, weakness and slight dizziness. While pt sitting, we changed her bed. Pt did not want to go to recliner at this time but wanted to go back to bed. Encouraged her to get to recliner later. Will try to wean oxygen as it gets closer to discharge.   Graylon Good, RN BSN  10/03/2020 10:43 AM

## 2020-10-03 NOTE — Consult Note (Signed)
Nephrology Consult   Requesting provider: Dr. [name] Service requesting consult: Heart Failure  Reason for consult: Acute Kidney Injury    Assessment/Recommendations: Dana [name] is a/an 60 y.o. female with a past medical history notable for CKD Stage 3b, CAD s/p PCI, T2DM, HFpEF, HLD, PAS, OSA.   # Non-Oliguric AKI: Likely secondary to contrast-induced nephropathy in the setting of PCI for NSTEMI on 09/29/2020. No urine output documented yesterday. Will follow up urine output today.  # Hx of CKD Stage 3b: History of CKD dating back to at least 2018 likely in the setting of long-term previously uncontrolled T2DM with significant proteinuria (most recently 03/2020: ~3 g). There is likely an aspect of obesity related GN as well. No hx of biopsy.  # Uremia: Concern for uremia with endorsement of dysgeusia, lack of appetite, nausea, vomiting, although symptoms may overlap with known gastroparesis. Will monitor closely.   -Continue to monitor daily Cr, Dose meds for GFR<15 -Monitor Daily I/Os, Daily weight  -Obtain urine sample for sediment analysis -Check Renal U/S -Maintain MAP>65 for optimal renal perfusion.  -Avoid further nephrotoxins including NSAIDS, Morphine. Unless absolutely necessary, avoid CT with contrast and/or MRI with gadolinium.    -Currently no indication for HD, although high risk of needing HD in the next coming days if no improvement.   # Type 2 Diabetes: 35 year history, uncontrolled till the last 4-5 years. Currently on insulin therapy with Farxiga for proteinuria. Follows with Endocrinology.  -Continue holding Stewart -SSI per primary   # NSTEMI s/p PCI  # Hx of Obstructive CAD -on DAPT  # Leukocytosis: Worsening leukocytosis without known source. Likely multifactorial given NSTEMI, epistaxis; no obvious infectious source. -CXR and blood cultures pending  # Anemia due to Renal Disease # Hx of Iron Deficiency Anemia  # Acute blood loss Anemia: Recent ABLA from  epistaxis yesterday requiring 1 unit of pRBCs. Hemoglobin today at 7.1. Repeat CBC pending.  -Transfuse for Hgb<7 g/dL -No IV iron -No role for ESA in setting of AKI  # Hypertension -BB being held  -Currently being managed with Hydralazine, Imdur  # Gastroparesis -Erythromycin per TRH -clear diet at this time  # HLD - Managed with Zetia and Repatha.   # OSA - CPAP at night  Recommendations conveyed to primary service.   Dr. Jose Persia Internal Medicine PGY-2  10/03/2020, 8:57 AM  ______________________________________________________________________________   History of Present Illness: Dana [name] is a/an 60 y.o. female with a past medical history of CKD Stage 3b, CAD s/p PCI, T2DM, HFpEF, HLD, PAS, OSA who presented to [facility] for acute chest pain concerning for NSTEMI.   [name] states that she has a recent history of NSTEMI in February 2022, at which time cath was deferred due to worsening in renal function.  At the time, medical management was recommended.  Given AKI with history of CKD, Aldactone and Wilder Glade were held.  She was continued on beta-blocker, Imdur, hydralazine.  About 2 weeks ago, she followed up with her doctor who instructed her to restart [place].  On March 17, [name] states that she woke up in the middle of the night with sudden onset chest pain.  She endorsed some dyspnea on exertion and chronic orthopnea, however denied any other complaints at the time, including fever, cough, abdominal pain, extremity pain.  On arrival to the ED, she was noted to have ST depression with acute elevation in troponin from 60-239.  Chest x-ray was concerning for pulmonary edema.  She  was started on nitro and heparin drip.  After discussing risks and benefits with the patient, she was taken to the cath lab and subsequently had a drug-eluting stent placed to the mid circumflex with balloon angioplasty to the OM1.    Dana [name] states that she has a 35-year history of  type 2 diabetes that has become well controlled in the past 4 to 5 years with A1c's below 7%.  Her to 2018, her diabetes was rather uncontrolled with A1c as high as 12%.  She has had numerous complications, including gastroparesis, retinopathy, neuropathy, microvascular disease.  She follows with endocrinology at [facility], Dr. Iran Planas.     Patient states she was first told she had kidney disease approximately 4 years ago at which time she was referred to nephrology by her PCP.  She has been following with [facility], Dr. Willene Hatchet, however she has had to transfer her care to [facility] due to insurance reasons and has not had a chance to follow-up yet.On review of patient's records, she did have evidence of an AKI in 2017 with resolution.  In 2018, creatinine fluctuated between 1.05-1.5.  This was her baseline up until February 2022, at which time she presented with a and of 1.87.  She had an AKI at the time that peaked at 3.36, with subsequent improvement to 2.1.  During hospitalization here, initial creatinine of 2.27 with improvement to 1.94, however has since then continued to climb to a maximum of 5.48 today.  Patient has a history of proteinuria that dates back to 2019, with initial UPCR of 948 that increased to a maximum of 4.5 g before improving to ~3 g.  For this, she was started on Farxiga in 2021.  She was already on spironolactone for her HFpEF. Patient has not been on a ACEi or ARB since at least 2019.  [name] does have a history of anticoagulant associated epistaxis for which she does receive transfusions when they occur.  She had an episode of epistaxis requiring 1 unit of PRBC in February and an additional episode yesterday, requiring 1 unit of PRBC.  Since having this nosebleed, she has been having a cough with bright red blood at times that she feels is from her nosebleed.  At this time, patient endorse orthopnea with lower extremity swelling, dysgeusia, nausea,  but denies chest pain, fever, chills.   Medications:  Current Facility-Administered Medications  Medication Dose Route Frequency Provider Last Rate Last Admin  . 0.9 %  sodium chloride infusion  250 mL Intravenous PRN Sherren Mocha, MD      . aspirin chewable tablet 81 mg  81 mg Oral Daily Sherren Mocha, MD   81 mg at 10/02/20 1813  . clopidogrel (PLAVIX) tablet 75 mg  75 mg Oral Daily Bensimhon, Daniel R, MD      . erythromycin 250 mg in sodium chloride 0.9 % 100 mL IVPB  250 mg Intravenous Q8H Vashti Hey, MD   Stopped at 10/03/20 0252  . guaiFENesin-dextromethorphan (ROBITUSSIN DM) 100-10 MG/5ML syrup 5 mL  5 mL Oral Q4H PRN Bensimhon, Shaune Pascal, MD   5 mL at 10/03/20 0607  . hydrALAZINE (APRESOLINE) injection 10 mg  10 mg Intravenous Q4H PRN Bensimhon, Shaune Pascal, MD   10 mg at 10/01/20 2137  . insulin aspart (novoLOG) injection 0-15 Units  0-15 Units Subcutaneous TID WC Consuelo Pandy, PA-C   3 Units at 10/03/20 6222  . insulin aspart (novoLOG) injection 0-5 Units  0-5  Units Subcutaneous QHS Consuelo Pandy, PA-C   3 Units at 10/01/20 2144  . insulin aspart (novoLOG) injection 3 Units  3 Units Subcutaneous TID WC Katherine Roan, MD   3 Units at 10/03/20 [phone]  . insulin detemir (LEVEMIR) injection 18 Units  18 Units Subcutaneous BID Domenic Polite, MD   18 Units at 10/02/20 2118  . LORazepam (ATIVAN) injection 0.5 mg  0.5 mg Intravenous Q6H PRN Domenic Polite, MD   0.5 mg at 10/02/20 1953  . morphine 2 MG/ML injection 1 mg  1 mg Intravenous Q4H PRN Vashti Hey, MD   1 mg at 10/03/20 0818  . nitroGLYCERIN (NITROSTAT) SL tablet 0.4 mg  0.4 mg Sublingual Q5 min PRN Sherren Mocha, MD   0.4 mg at 09/29/20 0818  . oxymetazoline (AFRIN) 0.05 % nasal spray 1 spray  1 spray Each Nare QID Bensimhon, Shaune Pascal, MD   1 spray at 10/02/20 2117  . promethazine (PHENERGAN) 12.5 mg in sodium chloride 0.9 % 50 mL IVPB  12.5 mg Intravenous TID Brooke Bonito, MD  200 mL/hr at 10/03/20 0753 12.5 mg at 10/03/20 0753  . sodium chloride (OCEAN) 0.65 % nasal spray 1 spray  1 spray Each Nare PRN Bensimhon, Shaune Pascal, MD   1 spray at 10/02/20 0810  . sodium chloride flush (NS) 0.9 % injection 3 mL  3 mL Intravenous Q12H Sherren Mocha, MD   3 mL at 10/02/20 0939  . sodium chloride flush (NS) 0.9 % injection 3 mL  3 mL Intravenous Q12H Sherren Mocha, MD   3 mL at 10/02/20 2117  . sodium chloride flush (NS) 0.9 % injection 3 mL  3 mL Intravenous PRN Sherren Mocha, MD         ALLERGIES Naproxen, Celecoxib, Aspirin, Glucosamine, Metoclopramide, Metolazone, Shellfish-derived products, and Tramadol  MEDICAL HISTORY Past Medical History:  Diagnosis Date  . Acquired absence of left great toe ([place])   . Acquired absence of other left toe(s) ([place])   . Anginal pain ([place])   . Atherosclerotic heart disease of native coronary artery without angina pectoris   . CAD (coronary artery disease)    08/2016 DES to RCA, also with history of LAD stenting.   . Chronic diastolic (congestive) heart failure ([place])   . Chronic kidney disease, stage 2 (mild)   . Chronic systolic (congestive) heart failure ([place])   . Contrast dye induced nephropathy, possible 09/03/2020  . Diabetes ([place])   . Diastolic CHF ([place])   . Gastro-esophageal reflux disease without esophagitis   . Hyperlipidemia   . Hypertension   . Hypertensive heart disease with heart failure ([place])   . Hypoxemia   . Idiopathic gout, multiple sites   . Localized edema   . Low back pain   . Mixed hyperlipidemia   . Mixed incontinence   . Morbid (severe) obesity due to excess calories ([place])   . Non-ST elevation (NSTEMI) myocardial infarction ([place]) 09/03/2020  . OSA (obstructive sleep apnea)   . OSA (obstructive sleep apnea) 09/03/2020  . Other specified hypothyroidism   . Primary insomnia   . Type 2 diabetes mellitus with complication, without long-term current use of insulin ([place]) 09/03/2020  . Type 2 diabetes  mellitus with diabetic nephropathy ([facility])      SOCIAL HISTORY Social History   Socioeconomic History  . Marital status: Married    Spouse name: Not on file  . Number of children: Not on file  . Years of education: Not on file  .  Highest education level: Not on file  Occupational History  . Not on file  Tobacco Use  . Smoking status: Former Research scientist (life sciences)  . Smokeless tobacco: Never Used  Vaping Use  . Vaping Use: Never used  Substance and Sexual Activity  . Alcohol use: Not Currently  . Drug use: Not Currently  . Sexual activity: Not on file  Other Topics Concern  . Not on file  Social History Narrative  . Not on file   Social Determinants of Health   Financial Resource Strain: Not on file  Food Insecurity: No Food Insecurity  . Worried About Charity fundraiser in the Last Year: Never true  . Ran Out of Food in the Last Year: Never true  Transportation Needs: No Transportation Needs  . Lack of Transportation (Medical): No  . Lack of Transportation (Non-Medical): No  Physical Activity: Insufficiently Active  . Days of Exercise per Week: 3 days  . Minutes of Exercise per Session: 40 min  Stress: Not on file  Social Connections: Not on file  Intimate Partner Violence: Not on file   FAMILY HISTORY Family History  Problem Relation Age of Onset  . Hypertension Father   . Coronary artery disease Mother   . Hypertension Mother   . Hyperlipidemia Mother   . Diabetes type II Mother   . Breast cancer Mother   . Lung cancer Mother   . Bipolar disorder Other   . Depression Other   . Schizophrenia Other    No family history of kidney disease  Review of Systems: 12 systems reviewed Otherwise as per HPI, all other systems reviewed and negative  Physical Exam: Vitals:   10/03/20 0555 10/03/20 0801  BP: (!) 143/61   Pulse: 92   Resp: 20   Temp: 98.6 F (37 C)   SpO2: 97% 98%   No intake/output data recorded.  Intake/Output Summary (Last 24 hours) at 10/03/2020  0856 Last data filed at 10/03/2020 0539 Gross per 24 hour  Intake 771.85 ml  Output -  Net 771.85 ml   General: well-appearing, no acute distress HEENT: anicteric sclera, oropharynx clear without lesions. Nares with scant dried blood noted.  CV: regular rate, normal rhythm, no murmurs, no gallops, no rubs.  Lungs: Rales heard throughout, particularly worse in middle and lower lobes. No wheezing or rhonchi. No increased work of breathing. Abd: soft, non-tender, non-distended Skin: no visible lesions or rashes Psych: alert, engaged, appropriate mood and affect Musculoskeletal: no obvious deformities Neuro: normal speech, no gross focal deficits   Test Results Reviewed Lab Results  Component Value Date   NA 135 10/03/2020   K 4.4 10/03/2020   CL 103 10/03/2020   CO2 21 (L) 10/03/2020   BUN 98 (H) 10/03/2020   CREATININE 5.48 (H) 10/03/2020   CALCIUM 8.1 (L) 10/03/2020   ALBUMIN 3.0 (L) 10/01/2020   I have reviewed all relevant outside healthcare records related to the patient's kidney injury.

## 2020-10-03 NOTE — Progress Notes (Signed)
PROGRESS NOTE    Dana [name]  XBM:[id number] DOB: 1960-09-12 DOA: 09/29/2020 PCP: [name], MD  Brief Narrative:Chronically ill 60 year old female with history of type 2 diabetes mellitus with gastroparesis and peripheral neuropathy, morbid obesity, BMI of 41, stage IIIb chronic kidney disease, CAD was admitted with NSTEMI, she was recently hospitalized last month for same and treated medically at the time, on 3/17 underwent PCI-DES to mid circumflex and balloon angioplasty to OM1. -Subsequently developed severe nausea and vomiting and worsening renal failure -TRH consulted for medical management -3/20 with profuse recurrent epistaxis and acute blood loss anemia   Assessment & Plan:   NSTEMI -Known coronary disease with multiple prior stents -Second admission with NSTEMI in 1 month, on 3/17 underwent left heart cath, PCI-DES to mid circumflex and angioplasty to OM1 -Due to recurrent nausea and vomiting she was started on Aggrastat 3/19 instead of Plavix until p.o. intake improves, yesterday morning with profuse epistaxis intermittent infrequent, Plavix restarted yesterday evening -add betablocker?  Nausea and vomiting Leukocytosis -Suspect this is multifactorial secondary to gastroparesis, severe epistaxis and swallowing blood,  NSTEMi etc -Abdominal exam is benign, s/p previous cholecystectomy -Symptoms appear to be improving, continue scheduled erythromycin x1 more day,continue IV Phenergan to TID AC today -I suspect her leukocytosis is reactive in the setting of NSTEMI, severe epistaxis etc., continue to trend  AKI on CKD 4 -Contrast-induced nephropathy  -Baseline creatinine around 2-2.5, creatinine now trending up to 5.4, urine output was 954 previous 24 hours, none recorded last night -Plan for nephrology consult -Monitor urine output closely, BMP in a.m.  Severe epistaxis Acute blood loss anemia -Had profuse epistaxis multiple times during the day yesterday while on  IV aggrastat-this was finally stopped, anterior epistaxis has resolved now but still with intermittent posterior epistaxis, swallowing blood  -Hemoglobin down to 7.0 from 9 range yesterday, transfused 1 unit of PRBC -May need another unit today, will repeat CBC -Check anemia panel  Chronic diastolic CHF -Appears slightly volume overloaded, -Defer diuretics to renal and CHF team  Uncontrolled type 2 diabetes mellitus -CBG is trending down now, decrease Levemir dose and stop Premeal NovoLog  -At risk of hypoglycemia with worsening kidney function  Obesity OSA -Continue CPAP nightly  Hypertension -stable, start beta-blocker tomorrow if p.o. intake is stable   DVT prophylaxis: SCDs due to severe epistaxis Code Status: Full code   Procedures: 3/17 -PCI-DES to mid circumflex and balloon angioplasty to OM1.  Antimicrobials:    Subjective: Still has nausea, no further vomiting, had multiple episodes of severe epistaxis yesterday, still occasionally swallowing blood from posterior nares  Objective: Vitals:   10/02/20 2048 10/03/20 0500 10/03/20 0555 10/03/20 0801  BP: (!) 115/49  (!) 143/61   Pulse: 83  92   Resp: 18  20   Temp: 98.7 F (37.1 C)  98.6 F (37 C)   TempSrc: Oral  Oral   SpO2: 96%  97% 98%  Weight:  110.8 kg    Height:        Intake/Output Summary (Last 24 hours) at 10/03/2020 1101 Last data filed at 10/03/2020 0539 Gross per 24 hour  Intake 771.85 ml  Output -  Net 771.85 ml   Filed Weights   10/01/20 0539 10/02/20 0600 10/03/20 0500  Weight: 113.4 kg 113.2 kg 110.8 kg    Examination:  General exam: Chronically ill frail female sitting up in bed, awake alert oriented x3 HEENT: No anterior epistaxis at this time CVS: S1-S2, regular rate rhythm Lungs: Few basilar  rales Abdomen: Soft, mildly distended, nontender, bowel sounds present Extremities: 1+ edema, compression stockings on Skin: No rashes on exposed skin Psychiatry: Flat  affect    Data Reviewed:   CBC: Recent Labs  Lab 09/26/20 1200 09/29/20 0636 09/29/20 0636 09/29/20 1955 09/30/20 0241 10/01/20 2300 10/02/20 1508 10/02/20 2240 10/03/20 0509  WBC 11.6* 15.3*  --  21.6* 18.0* 20.6*  --   --  24.3*  NEUTROABS 8.1*  --   --   --   --   --   --   --  20.4*  HGB 9.3* 9.0*   < > 9.1* 8.3* 8.3* 7.0* 7.5* 7.1*  HCT 29.9* 29.5*   < > 28.7* 25.8* 25.4* 22.2* 23.1* 21.9*  MCV 84 86.5  --  84.7 83.8 81.9  --   --  84.6  PLT 327 288  --  288 281 318  --   --  287   < > = values in this interval not displayed.   Basic Metabolic Panel: Recent Labs  Lab 09/29/20 1955 09/30/20 0241 10/01/20 0224 10/01/20 2300 10/02/20 0201 10/03/20 0509  NA 135 132* 133*  --  133* 135  K 4.0 3.9 3.9  --  4.1 4.4  CL 102 99 98  --  98 103  CO2 23 22 21*  --  19* 21*  GLUCOSE 159* 325* 270*  --  302* 169*  BUN 47* 49* 63*  --  76* 98*  CREATININE 1.94* 2.56* 3.94*  --  4.88* 5.48*  CALCIUM 8.3* 8.0* 8.8*  --  8.7* 8.1*  MG 2.1  --   --  2.2  --   --    GFR: Estimated Creatinine Clearance: 14 mL/min (A) (by C-G formula based on SCr of 5.48 mg/dL (H)). Liver Function Tests: Recent Labs  Lab 09/26/20 1200 09/29/20 0636 10/01/20 2300  AST 13 20 16   ALT 9 20 16   ALKPHOS 150* 130* 105  BILITOT 0.3 1.1 0.9  PROT 7.5 7.7 7.4  ALBUMIN 4.1 3.3* 3.0*   Recent Labs  Lab 09/29/20 0636  LIPASE 28   No results for input(s): AMMONIA in the last 168 hours. Coagulation Profile: No results for input(s): INR, PROTIME in the last 168 hours. Cardiac Enzymes: No results for input(s): CKTOTAL, CKMB, CKMBINDEX, TROPONINI in the last 168 hours. BNP (last 3 results) No results for input(s): PROBNP in the last 8760 hours. HbA1C: No results for input(s): HGBA1C in the last 72 hours. CBG: Recent Labs  Lab 10/02/20 1207 10/02/20 1606 10/02/20 2107 10/03/20 0728 10/03/20 1053  GLUCAP 269* 249* 166* 152* 100*   Lipid Profile: No results for input(s): CHOL, HDL,  LDLCALC, TRIG, CHOLHDL, LDLDIRECT in the last 72 hours. Thyroid Function Tests: No results for input(s): TSH, T4TOTAL, FREET4, T3FREE, THYROIDAB in the last 72 hours. Anemia Panel: Recent Labs    10/03/20 0752  VITAMINB12 196  FOLATE 10.0  FERRITIN 287  TIBC 224*  IRON 33  RETICCTPCT 1.9   Urine analysis:    Component Value Date/Time   COLORURINE YELLOW 09/30/2020 0022   APPEARANCEUR HAZY (A) 09/30/2020 0022   LABSPEC 1.018 09/30/2020 0022   PHURINE 5.0 09/30/2020 0022   GLUCOSEU 150 (A) 09/30/2020 0022   HGBUR SMALL (A) 09/30/2020 0022   BILIRUBINUR NEGATIVE 09/30/2020 0022   KETONESUR NEGATIVE 09/30/2020 0022   PROTEINUR 100 (A) 09/30/2020 0022   NITRITE NEGATIVE 09/30/2020 0022   LEUKOCYTESUR NEGATIVE 09/30/2020 0022   Sepsis Labs: @LABRCNTIP (procalcitonin:4,lacticidven:4)  ) Recent Results (  from the past 240 hour(s))  Resp Panel by RT-PCR (Flu A&B, Covid) Nasopharyngeal Swab     Status: None   Collection Time: 09/29/20  9:20 AM   Specimen: Nasopharyngeal Swab; Nasopharyngeal(NP) swabs in vial transport medium  Result Value Ref Range Status   SARS Coronavirus 2 by RT PCR NEGATIVE NEGATIVE Final    Comment: (NOTE) SARS-CoV-2 target nucleic acids are NOT DETECTED.  The SARS-CoV-2 RNA is generally detectable in upper respiratory specimens during the acute phase of infection. The lowest concentration of SARS-CoV-2 viral copies this assay can detect is 138 copies/mL. A negative result does not preclude SARS-Cov-2 infection and should not be used as the sole basis for treatment or other patient management decisions. A negative result may occur with  improper specimen collection/handling, submission of specimen other than nasopharyngeal swab, presence of viral mutation(s) within the areas targeted by this assay, and inadequate number of viral copies(<138 copies/mL). A negative result must be combined with clinical observations, patient history, and  epidemiological information. The expected result is Negative.  Fact Sheet for Patients:  EntrepreneurPulse.com.au  Fact Sheet for Healthcare Providers:  IncredibleEmployment.be  This test is no t yet approved or cleared by the [place] FDA and  has been authorized for detection and/or diagnosis of SARS-CoV-2 by FDA under an Emergency Use Authorization (EUA). This EUA will remain  in effect (meaning this test can be used) for the duration of the COVID-19 declaration under Section 564(b)(1) of the Act, 21 U.S.C.section 360bbb-3(b)(1), unless the authorization is terminated  or revoked sooner.       Influenza A by PCR NEGATIVE NEGATIVE Final   Influenza B by PCR NEGATIVE NEGATIVE Final    Comment: (NOTE) The Xpert Xpress SARS-CoV-2/FLU/RSV plus assay is intended as an aid in the diagnosis of influenza from Nasopharyngeal swab specimens and should not be used as a sole basis for treatment. Nasal washings and aspirates are unacceptable for Xpert Xpress SARS-CoV-2/FLU/RSV testing.  Fact Sheet for Patients: EntrepreneurPulse.com.au  Fact Sheet for Healthcare Providers: IncredibleEmployment.be  This test is not yet approved or cleared by the [place] FDA and has been authorized for detection and/or diagnosis of SARS-CoV-2 by FDA under an Emergency Use Authorization (EUA). This EUA will remain in effect (meaning this test can be used) for the duration of the COVID-19 declaration under Section 564(b)(1) of the Act, 21 U.S.C. section 360bbb-3(b)(1), unless the authorization is terminated or revoked.  Performed at [facility], [place] [address] [place], [place] [address]   Culture, blood (Routine X 2) w Reflex to ID Panel     Status: None (Preliminary result)   Collection Time: 09/29/20  7:55 PM   Specimen: BLOOD RIGHT HAND  Result Value Ref Range Status   Specimen Description BLOOD RIGHT HAND   Final   Special Requests   Final    BOTTLES DRAWN AEROBIC AND ANAEROBIC Blood Culture adequate volume   Culture   Final    NO GROWTH 3 DAYS Performed at [facility], [place] [address] [place], [place] [address]    Report Status PENDING  Incomplete  Culture, blood (Routine X 2) w Reflex to ID Panel     Status: None (Preliminary result)   Collection Time: 09/29/20  7:55 PM   Specimen: BLOOD LEFT HAND  Result Value Ref Range Status   Specimen Description BLOOD LEFT HAND  Final   Special Requests   Final    BOTTLES DRAWN AEROBIC AND ANAEROBIC Blood Culture adequate volume  Culture   Final    NO GROWTH 3 DAYS Performed at [facility], [place] [address], [place], [place] [address]    Report Status PENDING  Incomplete    Radiology Studies: DG Abd Portable 1V  Result Date: 10/01/2020 CLINICAL DATA:  Vomiting for 2 days. EXAM: PORTABLE ABDOMEN - 1 VIEW COMPARISON:  Abdominal radiograph dated 02/13/2016. FINDINGS: The bowel gas pattern is normal. No radio-opaque calculi. Degenerative changes are seen in both hips. IMPRESSION: Nonobstructive bowel gas pattern. Electronically Signed   By: Zerita Boers M.D.   On: 10/01/2020 16:27    Scheduled Meds: . aspirin  81 mg Oral Daily  . clopidogrel  75 mg Oral Daily  . insulin aspart  0-15 Units Subcutaneous TID WC  . insulin aspart  0-5 Units Subcutaneous QHS  . insulin aspart  3 Units Subcutaneous TID WC  . insulin detemir  18 Units Subcutaneous BID  . sodium chloride flush  3 mL Intravenous Q12H  . sodium chloride flush  3 mL Intravenous Q12H   Continuous Infusions: . sodium chloride    . erythromycin 250 mg (10/03/20 0934)  . promethazine (PHENERGAN) injection 12.5 mg (10/03/20 0753)     LOS: 4 days    Time spent: 68min  Domenic Polite, MD [facility]   10/03/2020, 11:01 AM

## 2020-10-03 NOTE — Care Management Important Message (Signed)
Important Message  Patient Details  Name: Dana [name] MRN: [id number] Date of Birth: 1960/09/11   Medicare Important Message Given:  Yes     [name] 10/03/2020, 11:40 AM

## 2020-10-03 NOTE — Progress Notes (Addendum)
Advanced Heart Failure Team Rounding Note   PCP:  [name], MD  PCP-Cardiology: [name], MD     Reason for Admission: Chest Pain, NSTEMI    HPI:    3/17-admitted for NSTEMI received PCI DES to mid circ, balloon angioplasty to OM1  No further CP. No SOB.  Creatinine worse today 2.2 > 2.5 > 3.9 > 4.9>>5.5.  K 4.4.  No hypotension.    She reports urinary occurences but I/Os not recorded.  Nephrology consult pending.   Had nose bleed last night. Hgb 7.1 Received 1u RBCs  WBC elevated 24K. AF. Denies subjective fever/ chills. Endorses produce cough w/ blood tinged sputum.  No dysuria.     Objective:    Vital Signs:   Temp:  [98.5 F (36.9 C)-98.8 F (37.1 C)] 98.6 F (37 C) (03/21 0555) Pulse Rate:  [83-102] 92 (03/21 0555) Resp:  [18-20] 20 (03/21 0555) BP: (98-145)/(49-71) 143/61 (03/21 0555) SpO2:  [91 %-98 %] 98 % (03/21 0801) Weight:  [110.8 kg] 110.8 kg (03/21 0500) Last BM Date: 10/01/20 Filed Weights   10/01/20 0539 10/02/20 0600 10/03/20 0500  Weight: 113.4 kg 113.2 kg 110.8 kg     Physical Exam     General: fatigue appearing, obese middle aged WF. No resp difficulty HEENT: normal Neck: supple. Thick neck, JVD not well visualized  Carotids 2+ bilat; no bruits. No lymphadenopathy or thryomegaly appreciated. Cor: PMI nondisplaced. Regular rate & rhythm. No rubs, gallops or murmurs. Lungs: clear Abdomen: obese soft, nontender, nondistended. No hepatosplenomegaly. No bruits or masses. Good bowel sounds. Extremities: no cyanosis, clubbing, rash, edema Neuro: alert & orientedx3, cranial nerves grossly intact. moves all 4 extremities w/o difficulty. Affect pleasant   Telemetry   Sinus 90s - sinus tach 90s-low 100s Personally reviewed  Labs     Basic Metabolic Panel: Recent Labs  Lab 09/29/20 1955 09/30/20 0241 10/01/20 0224 10/01/20 2300 10/02/20 0201 10/03/20 0509  NA 135 132* 133*  --  133* 135  K 4.0 3.9 3.9  --  4.1 4.4   CL 102 99 98  --  98 103  CO2 23 22 21*  --  19* 21*  GLUCOSE 159* 325* 270*  --  302* 169*  BUN 47* 49* 63*  --  76* 98*  CREATININE 1.94* 2.56* 3.94*  --  4.88* 5.48*  CALCIUM 8.3* 8.0* 8.8*  --  8.7* 8.1*  MG 2.1  --   --  2.2  --   --     Liver Function Tests: Recent Labs  Lab 09/26/20 1200 09/29/20 0636 10/01/20 2300  AST 13 20 16   ALT 9 20 16   ALKPHOS 150* 130* 105  BILITOT 0.3 1.1 0.9  PROT 7.5 7.7 7.4  ALBUMIN 4.1 3.3* 3.0*   Recent Labs  Lab 09/29/20 0636  LIPASE 28   No results for input(s): AMMONIA in the last 168 hours.  CBC: Recent Labs  Lab 09/26/20 1200 09/29/20 0636 09/29/20 0636 09/29/20 1955 09/30/20 0241 10/01/20 2300 10/02/20 1508 10/02/20 2240 10/03/20 0509  WBC 11.6* 15.3*  --  21.6* 18.0* 20.6*  --   --  24.3*  NEUTROABS 8.1*  --   --   --   --   --   --   --  20.4*  HGB 9.3* 9.0*   < > 9.1* 8.3* 8.3* 7.0* 7.5* 7.1*  HCT 29.9* 29.5*   < > 28.7* 25.8* 25.4* 22.2* 23.1* 21.9*  MCV 84 86.5  --  84.7 83.8 81.9  --   --  84.6  PLT 327 288  --  288 281 318  --   --  287   < > = values in this interval not displayed.    Cardiac Enzymes: No results for input(s): CKTOTAL, CKMB, CKMBINDEX, TROPONINI in the last 168 hours.  BNP: BNP (last 3 results) Recent Labs    08/29/20 0636 09/29/20 0724  BNP 87.6 268.7*    ProBNP (last 3 results) No results for input(s): PROBNP in the last 8760 hours.   CBG: Recent Labs  Lab 10/02/20 0734 10/02/20 1207 10/02/20 1606 10/02/20 2107 10/03/20 0728  GLUCAP 332* 269* 249* 166* 152*    Coagulation Studies: No results for input(s): LABPROT, INR in the last 72 hours.  Imaging: DG Abd Portable 1V  Result Date: 10/01/2020 CLINICAL DATA:  Vomiting for 2 days. EXAM: PORTABLE ABDOMEN - 1 VIEW COMPARISON:  Abdominal radiograph dated 02/13/2016. FINDINGS: The bowel gas pattern is normal. No radio-opaque calculi. Degenerative changes are seen in both hips. IMPRESSION: Nonobstructive bowel gas  pattern. Electronically Signed   By: Zerita Boers M.D.   On: 10/01/2020 16:27   Medications  . aspirin  81 mg Oral Daily  . clopidogrel  75 mg Oral Daily  . insulin aspart  0-15 Units Subcutaneous TID WC  . insulin aspart  0-5 Units Subcutaneous QHS  . insulin aspart  3 Units Subcutaneous TID WC  . insulin detemir  18 Units Subcutaneous BID  . sodium chloride flush  3 mL Intravenous Q12H  . sodium chloride flush  3 mL Intravenous Q12H    Assessment/Plan   1. NSTEMI - Known coronary disease with stent mid LAD and Prox RCA. Has been on repatha/plavix  - had NSTEMI in February. Cath deferred due to AKI. C - Developed chest pain  EKG changes noted ST depression V4/V5. HS 60>239 compatible with NSTEMI -3/17 received PCI DES to mid circ, balloon angioplasty to OM1  -no further CP continue DAPT w/ ASA + Plavix   2. AKI on CKD Stage IV - Recent AKI with up 3.4 during hospitalization in February.  - Creatinine continues to climb  2.2 > 2.5 > 3.9 > 4.9>>5.5. BUN 98 Likely due to CIN - Unfortunately Strict I/Os not recorded yesterday  - Consult nephrology   3. Acute on chronic Diastolic Heart Failure- Echo 08/2020 EF 55-60%  - diuretics on hold due to AKI. Received IVF for gastroparesis (now off) - Nephrology to assess and will help guide diuresis vs ? Need for HD   4. N/V due to severe gastroparesis - slightly improved today, ? If due to uremia BUN 98 - management per TRH.   5. OSA Continue CPAP nightly.   6. Epistaxis.  - continue PRN Afrin - Can try topical TXA. Discussed dosing with PharmD personally.  7. DM2 - sugars high - continue SSI  8. Chronic Anemia - Baseline Hgb 8-9 range, likely 2/2 renal disease  - exacerbated by Epistaxis, 7.1 today  - monitor for further bleeding - transfuse for Hgb < 7.0   11. Leukocytosis - WBK 24K but AF. Has not received steroids. ? Reactive from NSTEMI  - check CXR - Obtain UA/ UC  + BC    Brittainy Simmons, PA-C 10/03/2020,  10:42 AM  Advanced Heart Failure Team Pager [phone] (M-F; 7a - 5p)  Please contact [facility] for night-coverage after hours (4p -7a ) and weekends on [email or web address]  Patient seen and examined with the above-signed  Advanced Practice Provider and/or Housestaff. I personally reviewed laboratory data, imaging studies and relevant notes. I independently examined the patient and formulated the important aspects of the plan. I have edited the note to reflect any of my changes or salient points. I have personally discussed the plan with the patient and/or family.  Feels weak. No further CP. Still with some nausea. Epsitaxis improved after aggrastat stopped. Plavix loaded. Received 1u RBCs overnight. Hgb back down to 7.1. Nausea mildly improved. Creatinine up to 5.5.    General:  Weak appearing. No resp difficulty HEENT: normal Neck: supple. JVP 7-8. Carotids 2+ bilat; no bruits. No lymphadenopathy or thryomegaly appreciated. Cor: PMI nondisplaced. Regular rate & rhythm. No rubs, gallops or murmurs. Lungs: clear Abdomen: obese soft, nontender, nondistended. No hepatosplenomegaly. No bruits or masses. Good bowel sounds. Extremities: no cyanosis, clubbing, rash, edema Neuro: alert & orientedx3, cranial nerves grossly intact. moves all 4 extremities w/o difficulty. Affect pleasant  No further CP. Unfortunately creatinine continues to worsen. Have consulted Renal. No clear indications for HD but likely getting close. Epsitaxis has slowed. Now back on Plavix. Would check P2Y12 level in a day or two to make sure platelets adequately suppressed. Would transfuse another unit RBCs. Will d/w Dr. Broadus John as well.   [name], MD  3:51 PM

## 2020-10-03 NOTE — TOC Initial Note (Signed)
Transition of Care [facility]) - Initial Note Heart Failure   Patient Details  Name: Dana [name] MRN: [id number] Date of Birth: February 07, 1961  Transition of Care [facility]) CM/SW Contact:    [place], [place] Phone Number: 10/03/2020, 4:53 PM  Clinical Narrative:                 CSW spoke with patient and completed a brief SDOH. Ms. Kley reported having transportation to doctors appointments and getting her medications. Patient reports her medications can be expensive even with her health insurance and she isn't sure what medications they will discharge her with and what effect that will have with cost. [place] reports her PCP is doctor Dirk Dress at [facility]. Ms. Vandezande denied having any social needs at this time. However, patient's daughter asked if there was anyway her mother could get a ride to [facility] for her wedding on Saturday and that if she is medically ready for discharge would she be able to get a wheelchair due to Ms. Romberger's current health concerns. CSW will provide resources for medical supply store if PT/OT don't recommend equipment as health insurance then won't cover the cost. CSW provided the patient and family member with social workers name and position and if anything changes to please reach out so that CSW can provide support.                                                         Activities of Daily Living Home Assistive Devices/Equipment: Cane (specify quad or straight),CBG Meter,Oxygen,Walker (specify type),CPAP (straight cane, front wheel walker) ADL Screening (condition at time of admission) Patient's cognitive ability adequate to safely complete daily activities?: Yes Is the patient deaf or have difficulty hearing?: No Does the patient have difficulty seeing, even when wearing glasses/contacts?: No Does the patient have difficulty concentrating, remembering, or making decisions?: No Patient able to express need for assistance with  ADLs?: Yes Does the patient have difficulty dressing or bathing?: Yes Independently performs ADLs?: No Dressing (OT): Needs assistance Is this a change from baseline?: Change from baseline, expected to last >3 days Grooming: Independent Feeding: Independent Bathing: Needs assistance Is this a change from baseline?: Change from baseline, expected to last >3 days Toileting: Needs assistance Is this a change from baseline?: Change from baseline, expected to last >3days In/Out Bed: Independent Walks in Home: Independent Does the patient have difficulty walking or climbing stairs?: Yes Weakness of Legs: None Weakness of Arms/Hands: None                Admission diagnosis:  ACS (acute coronary syndrome) ([place]) [I24.9] Chest pain [R07.9] Severe comorbid illness [R69] Acute congestive heart failure, unspecified heart failure type [facility]) [I50.9] Patient Active Problem List   Diagnosis Date Noted  . Chest pain 09/29/2020  . ACS (acute coronary syndrome) ([place])   . Non-ST elevation (NSTEMI) myocardial infarction ([place]) 09/03/2020  . Contrast dye induced nephropathy, possible 09/03/2020  . OSA (obstructive sleep apnea) 09/03/2020  . Type 2 diabetes mellitus with complication, without long-term current use of insulin ([place]) 09/03/2020  . Acute on chronic diastolic heart failure ([place]) 08/29/2020  . Need for COVID-19 vaccine 06/28/2020  . Medicare annual wellness visit, subsequent 06/13/2020  . Chronic respiratory failure with hypoxia ([place]) 03/23/2020  . Chronic gout without tophus  03/23/2020  . Pain in both hands 03/23/2020  . Uncomplicated opioid dependence ([place]) 03/23/2020  . Depression, major, recurrent, mild ([place]) 12/21/2019  . Hypomagnesemia 12/21/2019  . Diarrhea 12/21/2019  . Lumbar back pain 10/05/2019  . Diabetic nephropathy associated with type 2 diabetes mellitus ([place]) 10/05/2019  . Hypertensive heart and renal disease with congestive heart failure ([place]) 09/18/2019  . Demand  ischemia ([place])   . Diastolic heart failure ([place]) 11/07/2017  . Morbid obesity with BMI of 45.0-49.9, adult ([place]) 11/07/2017  . Coronary artery disease involving native coronary artery of native heart without angina pectoris 12/25/2016  . Hyperlipidemia LDL goal <70 12/14/2016  . Anemia 03/23/2016  . Chronic pain syndrome 03/23/2016  . COPD (chronic obstructive pulmonary disease) ([place]) 03/23/2016  . GERD without esophagitis 03/23/2016  . On home oxygen therapy 03/23/2016  . S/P ablation of atrial fibrillation 10/04/2015  . Diabetic polyneuropathy associated with type 2 diabetes mellitus ([place]) 04/02/2014   PCP:  [name], MD Pharmacy:   [facility], [place] [address] [place] [address] Phone: [phone] Fax: [place], [address], [address] [place], [place] [address] [place] [address] Phone: [phone] Fax: [phone]     Social Determinants of Health (SDOH) Interventions Food Insecurity Interventions: Intervention Not Indicated Transportation Interventions: Intervention Not Indicated  Readmission Risk Interventions Readmission Risk Prevention Plan 09/03/2020  Transportation Screening Complete  PCP or Specialist Appt within 3-5 Days (No Data)  [place] or [place] Complete  Social Work Consult for [place] Planning/Counseling [place] Not Applicable  Medication Review Press photographer) Complete  Some recent data might be hidden   [place], MSW, LCSWA [phone] Heart Failure Social Worker

## 2020-10-04 ENCOUNTER — Inpatient Hospital Stay (HOSPITAL_COMMUNITY): Payer: HMO

## 2020-10-04 DIAGNOSIS — I214 Non-ST elevation (NSTEMI) myocardial infarction: Secondary | ICD-10-CM | POA: Diagnosis not present

## 2020-10-04 DIAGNOSIS — R112 Nausea with vomiting, unspecified: Secondary | ICD-10-CM | POA: Diagnosis not present

## 2020-10-04 DIAGNOSIS — N184 Chronic kidney disease, stage 4 (severe): Secondary | ICD-10-CM | POA: Diagnosis not present

## 2020-10-04 DIAGNOSIS — N179 Acute kidney failure, unspecified: Secondary | ICD-10-CM | POA: Diagnosis not present

## 2020-10-04 LAB — GLUCOSE, CAPILLARY
Glucose-Capillary: 110 mg/dL — ABNORMAL HIGH (ref 70–99)
Glucose-Capillary: 63 mg/dL — ABNORMAL LOW (ref 70–99)
Glucose-Capillary: 104 mg/dL — ABNORMAL HIGH (ref 70–99)
Glucose-Capillary: 133 mg/dL — ABNORMAL HIGH (ref 70–99)
Glucose-Capillary: 160 mg/dL — ABNORMAL HIGH (ref 70–99)

## 2020-10-04 LAB — BASIC METABOLIC PANEL
Anion gap: 10 (ref 5–15)
BUN: 92 mg/dL — ABNORMAL HIGH (ref 6–20)
CO2: 23 mmol/L (ref 22–32)
Calcium: 8.2 mg/dL — ABNORMAL LOW (ref 8.9–10.3)
Chloride: 105 mmol/L (ref 98–111)
Creatinine, Ser: 4.53 mg/dL — ABNORMAL HIGH (ref 0.44–1.00)
GFR, Estimated: 11 mL/min — ABNORMAL LOW (ref >60)
Glucose, Bld: 79 mg/dL (ref 70–99)
Potassium: 3.6 mmol/L (ref 3.5–5.1)
Sodium: 138 mmol/L (ref 135–145)

## 2020-10-04 LAB — TYPE AND SCREEN
ABO/RH(D): A NEG
Antibody Screen: NEGATIVE
Unit division: 0
Unit division: 0

## 2020-10-04 LAB — URINE CULTURE: Culture: NO GROWTH

## 2020-10-04 LAB — BPAM RBC
Blood Product Expiration Date: 202204132359
Blood Product Expiration Date: 202204132359
ISSUE DATE / TIME: 202203201717
ISSUE DATE / TIME: 202203211626
Unit Type and Rh: 600
Unit Type and Rh: 600

## 2020-10-04 LAB — CBC
HCT: 23.7 % — ABNORMAL LOW (ref 36.0–46.0)
Hemoglobin: 7.8 g/dL — ABNORMAL LOW (ref 12.0–15.0)
MCH: 27.8 pg (ref 26.0–34.0)
MCHC: 32.9 g/dL (ref 30.0–36.0)
MCV: 84.3 fL (ref 80.0–100.0)
Platelets: 298 10*3/uL (ref 150–400)
RBC: 2.81 MIL/uL — ABNORMAL LOW (ref 3.87–5.11)
RDW: 16 % — ABNORMAL HIGH (ref 11.5–15.5)
WBC: 17.3 10*3/uL — ABNORMAL HIGH (ref 4.0–10.5)
nRBC: 0 % (ref 0.0–0.2)

## 2020-10-04 MED ORDER — CARVEDILOL 6.25 MG PO TABS: 6.2500 mg | ORAL_TABLET | Freq: Two times a day (BID) | ORAL | Status: DC

## 2020-10-04 MED ORDER — INSULIN DETEMIR 100 UNIT/ML ~~LOC~~ SOLN
10.0000 [IU] | Freq: Every day | SUBCUTANEOUS | Status: DC
  Administered 2020-10-04 – 2020-10-06 (×3): 10 [IU] via SUBCUTANEOUS
  Filled 2020-10-04 (×3): qty 0.1

## 2020-10-04 MED ORDER — SODIUM CHLORIDE 0.9 % IV SOLN: 510.0000 mg | Freq: Once | INTRAVENOUS | Status: DC

## 2020-10-04 MED ORDER — POTASSIUM CHLORIDE CRYS ER 20 MEQ PO TBCR
40.0000 meq | EXTENDED_RELEASE_TABLET | Freq: Once | ORAL | Status: AC
  Administered 2020-10-04: 40 meq via ORAL
  Filled 2020-10-04: qty 2

## 2020-10-04 MED ORDER — ONDANSETRON HCL 4 MG/2ML IJ SOLN: 4.0000 mg | Freq: Four times a day (QID) | INTRAMUSCULAR | Status: DC | PRN

## 2020-10-04 MED ORDER — FUROSEMIDE 10 MG/ML IJ SOLN
80.0000 mg | Freq: Once | INTRAMUSCULAR | Status: AC
  Administered 2020-10-04: 80 mg via INTRAVENOUS
  Filled 2020-10-04: qty 8

## 2020-10-04 MED ORDER — CARVEDILOL 3.125 MG PO TABS
3.1250 mg | ORAL_TABLET | Freq: Two times a day (BID) | ORAL | Status: DC
  Administered 2020-10-04 – 2020-10-06 (×4): 3.125 mg via ORAL
  Filled 2020-10-04 (×4): qty 1

## 2020-10-04 NOTE — Progress Notes (Signed)
PT Cancellation Note  Patient Details Name: Dana [name] MRN: [id number] DOB: July 21, 1960   Cancelled Treatment:    Reason Eval/Treat Not Completed: Other (comment) Pt had just ambulated with cardiac rehab and requesting to defer PT this afternoon. Will follow up as schedule allows.   Lou Miner, DPT  Acute Rehabilitation Services  Pager: [phone] Office: [phone]    Rudean Hitt 10/04/2020, 3:31 PM

## 2020-10-04 NOTE — Progress Notes (Addendum)
PROGRESS NOTE    AIRIEL OBLINGER  HYQ:[id number] DOB: [birth year] DOA: 09/29/2020 PCP: [name], MD  Brief Narrative:Chronically ill 60 year old female with history of type 2 diabetes mellitus with gastroparesis and peripheral neuropathy, morbid obesity, BMI of 41, stage IIIb chronic kidney disease, CAD was admitted with NSTEMI, she was recently hospitalized last month for same and treated medically at the time, on 3/17 underwent PCI-DES to mid circumflex and balloon angioplasty to OM1. -Subsequently developed severe nausea and vomiting and worsening renal failure -TRH consulted for medical management -3/20 with profuse recurrent epistaxis and acute blood loss anemia   Assessment & Plan:   NSTEMI -Known coronary disease with multiple prior stents -Second admission with NSTEMI in 1 month, on 3/17 underwent left heart cath, PCI-DES to mid circumflex and angioplasty to OM1 -Due to recurrent nausea and vomiting she was started on Aggrastat 3/19 instead of Plavix -Back on Plavix, add low-dose carvedilol, episode of SVT this morning -Ambulate  Nausea and vomiting Leukocytosis -multifactorial secondary to gastroparesis, severe epistaxis and swallowing blood,  NSTEMi etc -Improved with resolution of posterior epistaxis and IV erythromycin and scheduled Phenergan for gastroparesis, stop erythromycin -Advance diet -I suspect her leukocytosis is reactive in the setting of NSTEMI, severe epistaxis etc., afebrile and nontoxic, continue to trend  AKI on CKD 4 -Contrast-induced nephropathy  -Baseline creatinine around 2-2.5, creatinine trended up to 5.4 yesterday, now improving, creatinine down to 4.5 and brisk urine output of 2500 last 24 hours -Suspect resolving ATN -Appreciate nephrology input -Continue to monitor urine output and kidney function closely  Severe epistaxis Acute blood loss anemia -Had profuse epistaxis multiple times during the day 3/20 while on IV aggrastat-this was  finally stopped, anterior epistaxis resolved but was still having intermittent posterior epistaxis, swallowing blood, this has also finally resolved -Hemoglobin dropped to 7.0, transfused 2 units of PRBC in the last 48 hours  -Anemia panel is more suggestive of chronic disease, has not equilibrated -Patient reports allergy to IV iron -Add oral iron at discharge  Acute on chronic diastolic CHF -Volume status improving, starting to auto diurese with resolving ATN -Monitor off diuretics  Uncontrolled type 2 diabetes mellitus -CBGs trending down, decrease Levemir dose  Obesity OSA -Continue CPAP nightly  Hypertension -stable, start beta-blocker today   DVT prophylaxis: SCDs due to severe epistaxis Code Status: Full code   Procedures: 3/17 -PCI-DES to mid circumflex and balloon angioplasty to OM1.  Antimicrobials:    Subjective: -Feels better, breathing improving, no further nausea or vomiting, no further epistaxis  Objective: Vitals:   10/03/20 1815 10/03/20 1830 10/03/20 2016 10/04/20 0546  BP: (!) 110/41 (!) 131/49 (!) 159/65 (!) 125/48  Pulse: 80 82 82 80  Resp: 18 16 17 16   Temp: 98 F (36.7 C) 98.5 F (36.9 C) 97.6 F (36.4 C) 98 F (36.7 C)  TempSrc: Oral Oral Oral Oral  SpO2: 100% 100% 99% 97%  Weight:    113.3 kg  Height:        Intake/Output Summary (Last 24 hours) at 10/04/2020 1104 Last data filed at 10/04/2020 9528 Gross per 24 hour  Intake 1090.67 ml  Output 2525 ml  Net -1434.33 ml   Filed Weights   10/02/20 0600 10/03/20 0500 10/04/20 0546  Weight: 113.2 kg 110.8 kg 113.3 kg    Examination:  General exam: Chronically ill frail female sitting up in bed, awake alert oriented x3 no distress HEENT: No anterior epistaxis CVS: S1-S2, regular rhythm Lungs: Few basilar rales Abdomen: Soft, less  distended, nontender, bowel sounds present Extremities: Trace edema, compression stockings on Skin: No rashes on exposed skin Psychiatry: Flat  affect    Data Reviewed:   CBC: Recent Labs  Lab 09/30/20 0241 10/01/20 2300 10/02/20 1508 10/02/20 2240 10/03/20 0509 10/03/20 1526 10/04/20 0240  WBC 18.0* 20.6*  --   --  24.3* 19.8* 17.3*  NEUTROABS  --   --   --   --  20.4*  --   --   HGB 8.3* 8.3* 7.0* 7.5* 7.1* 6.7* 7.8*  HCT 25.8* 25.4* 22.2* 23.1* 21.9* 20.3* 23.7*  MCV 83.8 81.9  --   --  84.6 83.5 84.3  PLT 281 318  --   --  287 290 323   Basic Metabolic Panel: Recent Labs  Lab 09/29/20 1955 09/30/20 0241 10/01/20 0224 10/01/20 2300 10/02/20 0201 10/03/20 0509 10/04/20 0240  NA 135 132* 133*  --  133* 135 138  K 4.0 3.9 3.9  --  4.1 4.4 3.6  CL 102 99 98  --  98 103 105  CO2 23 22 21*  --  19* 21* 23  GLUCOSE 159* 325* 270*  --  302* 169* 79  BUN 47* 49* 63*  --  76* 98* 92*  CREATININE 1.94* 2.56* 3.94*  --  4.88* 5.48* 4.53*  CALCIUM 8.3* 8.0* 8.8*  --  8.7* 8.1* 8.2*  MG 2.1  --   --  2.2  --   --   --    GFR: Estimated Creatinine Clearance: 17.2 mL/min (A) (by C-G formula based on SCr of 4.53 mg/dL (H)). Liver Function Tests: Recent Labs  Lab 09/29/20 0636 10/01/20 2300  AST 20 16  ALT 20 16  ALKPHOS 130* 105  BILITOT 1.1 0.9  PROT 7.7 7.4  ALBUMIN 3.3* 3.0*   Recent Labs  Lab 09/29/20 0636  LIPASE 28   No results for input(s): AMMONIA in the last 168 hours. Coagulation Profile: No results for input(s): INR, PROTIME in the last 168 hours. Cardiac Enzymes: No results for input(s): CKTOTAL, CKMB, CKMBINDEX, TROPONINI in the last 168 hours. BNP (last 3 results) No results for input(s): PROBNP in the last 8760 hours. HbA1C: No results for input(s): HGBA1C in the last 72 hours. CBG: Recent Labs  Lab 10/03/20 1053 10/03/20 1612 10/03/20 2112 10/04/20 0736 10/04/20 0802  GLUCAP 100* 106* 82 63* 110*   Lipid Profile: No results for input(s): CHOL, HDL, LDLCALC, TRIG, CHOLHDL, LDLDIRECT in the last 72 hours. Thyroid Function Tests: No results for input(s): TSH, T4TOTAL, FREET4,  T3FREE, THYROIDAB in the last 72 hours. Anemia Panel: Recent Labs    10/03/20 0752  VITAMINB12 196  FOLATE 10.0  FERRITIN 287  TIBC 224*  IRON 33  RETICCTPCT 1.9   Urine analysis:    Component Value Date/Time   COLORURINE YELLOW 10/03/2020 1115   APPEARANCEUR CLEAR 10/03/2020 1115   LABSPEC 1.025 10/03/2020 1115   PHURINE 5.0 10/03/2020 1115   GLUCOSEU 250 (A) 10/03/2020 1115   HGBUR NEGATIVE 10/03/2020 1115   BILIRUBINUR NEGATIVE 10/03/2020 1115   KETONESUR NEGATIVE 10/03/2020 1115   PROTEINUR 100 (A) 10/03/2020 1115   NITRITE NEGATIVE 10/03/2020 1115   LEUKOCYTESUR NEGATIVE 10/03/2020 1115   Sepsis Labs: @LABRCNTIP (procalcitonin:4,lacticidven:4)  ) Recent Results (from the past 240 hour(s))  Resp Panel by RT-PCR (Flu A&B, Covid) Nasopharyngeal Swab     Status: None   Collection Time: 09/29/20  9:20 AM   Specimen: Nasopharyngeal Swab; Nasopharyngeal(NP) swabs in vial transport medium  Result  Value Ref Range Status   SARS Coronavirus 2 by RT PCR NEGATIVE NEGATIVE Final    Comment: (NOTE) SARS-CoV-2 target nucleic acids are NOT DETECTED.  The SARS-CoV-2 RNA is generally detectable in upper respiratory specimens during the acute phase of infection. The lowest concentration of SARS-CoV-2 viral copies this assay can detect is 138 copies/mL. A negative result does not preclude SARS-Cov-2 infection and should not be used as the sole basis for treatment or other patient management decisions. A negative result may occur with  improper specimen collection/handling, submission of specimen other than nasopharyngeal swab, presence of viral mutation(s) within the areas targeted by this assay, and inadequate number of viral copies(<138 copies/mL). A negative result must be combined with clinical observations, patient history, and epidemiological information. The expected result is Negative.  Fact Sheet for Patients:  EntrepreneurPulse.com.au  Fact Sheet for  Healthcare Providers:  IncredibleEmployment.be  This test is no t yet approved or cleared by the [place] FDA and  has been authorized for detection and/or diagnosis of SARS-CoV-2 by FDA under an Emergency Use Authorization (EUA). This EUA will remain  in effect (meaning this test can be used) for the duration of the COVID-19 declaration under Section 564(b)(1) of the Act, 21 U.S.C.section 360bbb-3(b)(1), unless the authorization is terminated  or revoked sooner.       Influenza A by PCR NEGATIVE NEGATIVE Final   Influenza B by PCR NEGATIVE NEGATIVE Final    Comment: (NOTE) The Xpert Xpress SARS-CoV-2/FLU/RSV plus assay is intended as an aid in the diagnosis of influenza from Nasopharyngeal swab specimens and should not be used as a sole basis for treatment. Nasal washings and aspirates are unacceptable for Xpert Xpress SARS-CoV-2/FLU/RSV testing.  Fact Sheet for Patients: EntrepreneurPulse.com.au  Fact Sheet for Healthcare Providers: IncredibleEmployment.be  This test is not yet approved or cleared by the [place] FDA and has been authorized for detection and/or diagnosis of SARS-CoV-2 by FDA under an Emergency Use Authorization (EUA). This EUA will remain in effect (meaning this test can be used) for the duration of the COVID-19 declaration under Section 564(b)(1) of the Act, 21 U.S.C. section 360bbb-3(b)(1), unless the authorization is terminated or revoked.  Performed at [facility], [place] [address], [place], [place] [address]   Culture, blood (Routine X 2) w Reflex to ID Panel     Status: None (Preliminary result)   Collection Time: 09/29/20  7:55 PM   Specimen: BLOOD RIGHT HAND  Result Value Ref Range Status   Specimen Description BLOOD RIGHT HAND  Final   Special Requests   Final    BOTTLES DRAWN AEROBIC AND ANAEROBIC Blood Culture adequate volume   Culture   Final    NO GROWTH 4 DAYS Performed  at [facility], [place] [address], [place], [place] [address]    Report Status PENDING  Incomplete  Culture, blood (Routine X 2) w Reflex to ID Panel     Status: None (Preliminary result)   Collection Time: 09/29/20  7:55 PM   Specimen: BLOOD LEFT HAND  Result Value Ref Range Status   Specimen Description BLOOD LEFT HAND  Final   Special Requests   Final    BOTTLES DRAWN AEROBIC AND ANAEROBIC Blood Culture adequate volume   Culture   Final    NO GROWTH 4 DAYS Performed at [facility], [place] [address] [place], [place] [address]    Report Status PENDING  Incomplete  Culture, blood (routine x 2)  Status: None (Preliminary result)   Collection Time: 10/03/20 11:41 AM   Specimen: BLOOD  Result Value Ref Range Status   Specimen Description BLOOD RIGHT ANTECUBITAL  Final   Special Requests   Final    BOTTLES DRAWN AEROBIC AND ANAEROBIC Blood Culture results may not be optimal due to an inadequate volume of blood received in culture bottles   Culture   Final    NO GROWTH < 24 HOURS Performed at [facility], [address] [place], [place] [address]    Report Status PENDING  Incomplete  Culture, blood (routine x 2)     Status: None (Preliminary result)   Collection Time: 10/03/20 11:41 AM   Specimen: BLOOD LEFT HAND  Result Value Ref Range Status   Specimen Description BLOOD LEFT HAND  Final   Special Requests   Final    BOTTLES DRAWN AEROBIC AND ANAEROBIC Blood Culture results may not be optimal due to an inadequate volume of blood received in culture bottles   Culture   Final    NO GROWTH < 24 HOURS Performed at [facility], [place] [address] [place], [place] [address]    Report Status PENDING  Incomplete    Radiology Studies: DG Chest 2 View  Result Date: 10/03/2020 CLINICAL DATA:  Leukocytosis EXAM: CHEST - 2 VIEW COMPARISON:  09/29/2020, CT 08/29/2020 FINDINGS: Diffuse hazy interstitial opacities are present throughout both lungs with cephalization  and congestion of the pulmonary vascularity. No pneumothorax. No visible effusion. Stable cardiomegaly. Metallic implant again seen in the left hilar pulmonary arteries. No acute osseous or soft tissue abnormality. Degenerative changes are present in the imaged spine and shoulders. Telemetry leads overlie the chest. IMPRESSION: Stable cardiomegaly and features most suggestive of CHF with pulmonary edema though underlying infection is difficult to fully exclude. Electronically Signed   By: Lovena Le M.D.   On: 10/03/2020 23:06   US RENAL  Result Date: 10/04/2020 CLINICAL DATA:  Acute kidney injury, CKD stage IV EXAM: RENAL / URINARY TRACT ULTRASOUND COMPLETE COMPARISON:  None. FINDINGS: Right Kidney: Renal measurements: 11.1 x 5.0 x 5.7 cm = volume: 164.6 mL. Diffusely increased cortical echogenicity. No concerning renal mass, shadowing calculus or hydronephrosis. Left Kidney: Renal measurements: 11.1 x 6.2 x 5.3 cm = volume: 187.3 mL. Diffusely increased renal cortical echogenicity. No concerning renal mass, shadowing calculus or hydronephrosis. Bladder: Appears normal for degree of bladder distention. Other: Technically challenging exam given patient's body habitus and bowel gas. IMPRESSION: 1. Diffusely increased renal cortical echogenicity bilaterally, compatible with medical renal disease. 2. Otherwise unremarkable renal ultrasound. 3. Technically challenging exam given bowel gas and body habitus. Electronically Signed   By: Lovena Le M.D.   On: 10/04/2020 03:18    Scheduled Meds: . aspirin  81 mg Oral Daily  . carvedilol  6.25 mg Oral BID WC  . clopidogrel  75 mg Oral Daily  . insulin aspart  0-15 Units Subcutaneous TID WC  . insulin aspart  0-5 Units Subcutaneous QHS  . insulin detemir  10 Units Subcutaneous Daily  . sodium chloride flush  3 mL Intravenous Q12H  . sodium chloride flush  3 mL Intravenous Q12H   Continuous Infusions: . sodium chloride    . promethazine (PHENERGAN)  injection 12.5 mg (10/04/20 0851)     LOS: 5 days    Time spent: 63min  Domenic Polite, MD [facility]   10/04/2020, 11:04 AM

## 2020-10-04 NOTE — Progress Notes (Signed)
Pt states she self administers her CPAP when she is ready for bed.

## 2020-10-04 NOTE — Progress Notes (Signed)
[facility] Progress Note    Assessment/ Plan:    # Non-Oliguric AKI: 2/2 contrast induced nephropathy. Creatinine appears to have peaked and is improving. No additional intervention indicated at this time other than watchful waiting. Renal ultrasound showed medical renal disease only.  # Hx of CKD Stage 3b: History of CKD dating back to at least 2018 likely in the setting of long-term previously uncontrolled T2DM with significant proteinuria (most recently 03/2020: ~3 g). There is likely an aspect of obesity related GN as well. No hx of biopsy.  # Uremia: Small improvement in BUN in the past 24 hours. Uremic symptoms improving.  -Continue to monitor daily Cr, Dose meds for GFR<15 -Monitor Daily I/Os, Daily weight  -Maintain MAP>65 for optimal renal perfusion.  -Avoid further nephrotoxins including NSAIDS, Morphine. Unless absolutely necessary, avoid CT with contrast and/or MRI with gadolinium.    -Currently no indication for HD  # Type 2 Diabetes: 35 year history. Currently on insulin therapy with Farxiga for proteinuria. Follows with Endocrinology.  -Continue holding [place] -SSI per primary   # NSTEMI s/p PCI  # Hx of Obstructive CAD -on DAPT  # Leukocytosis: Improving today. Infectious workup thus far negative.  -Continue to monitor  # Acute blood loss Anemia: Recent ABLA from epistaxis requiring a total of 2 units pRBCs. Hemoglobin today improved to 7.8.  # Anemia due to Renal Disease # Hx of Iron Deficiency Anemia: Iron panel without evidence of IDA a this time.  -Transfuse for Hgb<7 g/dL -No IV iron -No role for ESA in setting of AKI  # Hypertension -BB being held  -Currently being managed with Hydralazine, Imdur  # HLD - Managed with Zetia and Repatha.   # OSA - CPAP at night  # Acute on Chronic Gastroparesis: Resolved   Subjective:    Dana [name] reports she is feeling much better today with increased energy, decreased DOE. She is no  longer feeling nausea and her appetite has improved. She continues to have dysgeusia that is making water intake difficult though. Denies CP.    Objective:   BP (!) 125/48 (BP Location: Left Arm)   Pulse 80   Temp 98 F (36.7 C) (Oral)   Resp 16   Ht 5\' 5"  (1.651 m)   Wt 113.3 kg   SpO2 97%   BMI 41.57 kg/m   Intake/Output Summary (Last 24 hours) at 10/04/2020 0843 Last data filed at 10/04/2020 0610 Gross per 24 hour  Intake 1090.67 ml  Output 2525 ml  Net -1434.33 ml   Weight change: 2.54 kg  Physical Exam: Gen: Resting comfortably. No acute distress.  CVS:RRR. No murmurs.  Resp:No increased work of breathing.   Imaging: DG Chest 2 View  Result Date: 10/03/2020 CLINICAL DATA:  Leukocytosis EXAM: CHEST - 2 VIEW COMPARISON:  09/29/2020, CT 08/29/2020 FINDINGS: Diffuse hazy interstitial opacities are present throughout both lungs with cephalization and congestion of the pulmonary vascularity. No pneumothorax. No visible effusion. Stable cardiomegaly. Metallic implant again seen in the left hilar pulmonary arteries. No acute osseous or soft tissue abnormality. Degenerative changes are present in the imaged spine and shoulders. Telemetry leads overlie the chest. IMPRESSION: Stable cardiomegaly and features most suggestive of CHF with pulmonary edema though underlying infection is difficult to fully exclude. Electronically Signed   By: Lovena Le M.D.   On: 10/03/2020 23:06   US RENAL  Result Date: 10/04/2020 CLINICAL DATA:  Acute kidney injury, CKD stage IV EXAM: RENAL / URINARY TRACT ULTRASOUND  COMPLETE COMPARISON:  None. FINDINGS: Right Kidney: Renal measurements: 11.1 x 5.0 x 5.7 cm = volume: 164.6 mL. Diffusely increased cortical echogenicity. No concerning renal mass, shadowing calculus or hydronephrosis. Left Kidney: Renal measurements: 11.1 x 6.2 x 5.3 cm = volume: 187.3 mL. Diffusely increased renal cortical echogenicity. No concerning renal mass, shadowing calculus or  hydronephrosis. Bladder: Appears normal for degree of bladder distention. Other: Technically challenging exam given patient's body habitus and bowel gas. IMPRESSION: 1. Diffusely increased renal cortical echogenicity bilaterally, compatible with medical renal disease. 2. Otherwise unremarkable renal ultrasound. 3. Technically challenging exam given bowel gas and body habitus. Electronically Signed   By: Lovena Le M.D.   On: 10/04/2020 03:18    Labs: BMET Recent Labs  Lab 09/29/20 0636 09/29/20 1955 09/30/20 0241 10/01/20 0224 10/02/20 0201 10/03/20 0509 10/04/20 0240  NA 135 135 132* 133* 133* 135 138  K 4.0 4.0 3.9 3.9 4.1 4.4 3.6  CL 98 102 99 98 98 103 105  CO2 24 23 22  21* 19* 21* 23  GLUCOSE 293* 159* 325* 270* 302* 169* 79  BUN 51* 47* 49* 63* 76* 98* 92*  CREATININE 2.27* 1.94* 2.56* 3.94* 4.88* 5.48* 4.53*  CALCIUM 8.8* 8.3* 8.0* 8.8* 8.7* 8.1* 8.2*   CBC Recent Labs  Lab 10/01/20 2300 10/02/20 1508 10/02/20 2240 10/03/20 0509 10/03/20 1526 10/04/20 0240  WBC 20.6*  --   --  24.3* 19.8* 17.3*  NEUTROABS  --   --   --  20.4*  --   --   HGB 8.3*   < > 7.5* 7.1* 6.7* 7.8*  HCT 25.4*   < > 23.1* 21.9* 20.3* 23.7*  MCV 81.9  --   --  84.6 83.5 84.3  PLT 318  --   --  287 290 298   < > = values in this interval not displayed.    Medications:    . aspirin  81 mg Oral Daily  . clopidogrel  75 mg Oral Daily  . insulin aspart  0-15 Units Subcutaneous TID WC  . insulin aspart  0-5 Units Subcutaneous QHS  . insulin detemir  10 Units Subcutaneous Daily  . sodium chloride flush  3 mL Intravenous Q12H  . sodium chloride flush  3 mL Intravenous Q12H   Dr. Jose Persia Internal Medicine PGY-2  10/04/2020, 8:44 AM

## 2020-10-04 NOTE — Progress Notes (Signed)
CARDIAC REHAB PHASE I   PRE:  Rate/Rhythm: 82 SR  BP:  Supine: 132/51  Sitting:   Standing:    SaO2: 88%RA  96% 2L  MODE:  Ambulation: 100 ft   POST:  Rate/Rhythm: 87 SR  BP:  Supine:   Sitting: 134/49  Standing:    SaO2: 92% 2L 1329-1405 Pt looking so much better today and feeling better. Put on 2L to walk and stayed above 90%. Pt desat on RA in room. Had one episode during walk when she felt a little lightheaded but did not need to sit. Stopped a couple of times to rest and take deep breaths. To sitting on side of bed after walk. Daughter in room.  Left on 2L. Walked 100 ft with rolling walker and asst x 1.   Graylon Good, RN BSN  10/04/2020 2:03 PM

## 2020-10-04 NOTE — Progress Notes (Addendum)
Advanced Heart Failure Team Rounding Note   PCP:  [name], MD  PCP-Cardiology: [name], MD     Reason for Admission: Chest Pain, NSTEMI    HPI:    3/17-admitted for NSTEMI received PCI DES to mid circ, balloon angioplasty to OM1 3/20 & 3/21 received 1UPRBCs. -->Hgb 7.8   3/21 received dose of IV lasix. >2.5 urine output.   Creatinine trend 2.2 > 2.5 > 3.9 > 4.9>>5.5>>4.5 .   Feeling better today. Denies pain. Denies shortness of breath.   Objective:    Vital Signs:   Temp:  [97.6 F (36.4 C)-99 F (37.2 C)] 98 F (36.7 C) (03/22 0546) Pulse Rate:  [80-83] 80 (03/22 0546) Resp:  [16-20] 16 (03/22 0546) BP: (110-159)/(41-65) 125/48 (03/22 0546) SpO2:  [97 %-100 %] 97 % (03/22 0546) Weight:  [113.3 kg] 113.3 kg (03/22 0546) Last BM Date: 10/01/20 Filed Weights   10/02/20 0600 10/03/20 0500 10/04/20 0546  Weight: 113.2 kg 110.8 kg 113.3 kg     Physical Exam     General:   No resp difficulty HEENT: normal Neck: supple. JVP difficult to assess. . Carotids 2+ bilat; no bruits. No lymphadenopathy or thryomegaly appreciated. Cor: PMI nondisplaced. Regular rate & rhythm. No rubs, gallops or murmurs. Lungs: clear Abdomen: soft, nontender, nondistended. No hepatosplenomegaly. No bruits or masses. Good bowel sounds. Extremities: no cyanosis, clubbing, rash, R and LLE  Compression stockings.  Neuro: alert & orientedx3, cranial nerves grossly intact. moves all 4 extremities w/o difficulty. Affect pleasant   Telemetry  SR-ST 90-100s personally reviewed.   Labs     Basic Metabolic Panel: Recent Labs  Lab 09/29/20 1955 09/30/20 0241 10/01/20 0224 10/01/20 2300 10/02/20 0201 10/03/20 0509 10/04/20 0240  NA 135 132* 133*  --  133* 135 138  K 4.0 3.9 3.9  --  4.1 4.4 3.6  CL 102 99 98  --  98 103 105  CO2 23 22 21*  --  19* 21* 23  GLUCOSE 159* 325* 270*  --  302* 169* 79  BUN 47* 49* 63*  --  76* 98* 92*  CREATININE 1.94* 2.56* 3.94*  --  4.88*  5.48* 4.53*  CALCIUM 8.3* 8.0* 8.8*  --  8.7* 8.1* 8.2*  MG 2.1  --   --  2.2  --   --   --     Liver Function Tests: Recent Labs  Lab 09/29/20 0636 10/01/20 2300  AST 20 16  ALT 20 16  ALKPHOS 130* 105  BILITOT 1.1 0.9  PROT 7.7 7.4  ALBUMIN 3.3* 3.0*   Recent Labs  Lab 09/29/20 0636  LIPASE 28   No results for input(s): AMMONIA in the last 168 hours.  CBC: Recent Labs  Lab 09/30/20 0241 10/01/20 2300 10/02/20 1508 10/02/20 2240 10/03/20 0509 10/03/20 1526 10/04/20 0240  WBC 18.0* 20.6*  --   --  24.3* 19.8* 17.3*  NEUTROABS  --   --   --   --  20.4*  --   --   HGB 8.3* 8.3* 7.0* 7.5* 7.1* 6.7* 7.8*  HCT 25.8* 25.4* 22.2* 23.1* 21.9* 20.3* 23.7*  MCV 83.8 81.9  --   --  84.6 83.5 84.3  PLT 281 318  --   --  287 290 298    Cardiac Enzymes: No results for input(s): CKTOTAL, CKMB, CKMBINDEX, TROPONINI in the last 168 hours.  BNP: BNP (last 3 results) Recent Labs    08/29/20 0636 09/29/20 8527  BNP 87.6 268.7*    ProBNP (last 3 results) No results for input(s): PROBNP in the last 8760 hours.   CBG: Recent Labs  Lab 10/03/20 1053 10/03/20 1612 10/03/20 2112 10/04/20 0736 10/04/20 0802  GLUCAP 100* 106* 82 63* 110*    Coagulation Studies: No results for input(s): LABPROT, INR in the last 72 hours.  Imaging: DG Chest 2 View  Result Date: 10/03/2020 CLINICAL DATA:  Leukocytosis EXAM: CHEST - 2 VIEW COMPARISON:  09/29/2020, CT 08/29/2020 FINDINGS: Diffuse hazy interstitial opacities are present throughout both lungs with cephalization and congestion of the pulmonary vascularity. No pneumothorax. No visible effusion. Stable cardiomegaly. Metallic implant again seen in the left hilar pulmonary arteries. No acute osseous or soft tissue abnormality. Degenerative changes are present in the imaged spine and shoulders. Telemetry leads overlie the chest. IMPRESSION: Stable cardiomegaly and features most suggestive of CHF with pulmonary edema though  underlying infection is difficult to fully exclude. Electronically Signed   By: Lovena Le M.D.   On: 10/03/2020 23:06   US RENAL  Result Date: 10/04/2020 CLINICAL DATA:  Acute kidney injury, CKD stage IV EXAM: RENAL / URINARY TRACT ULTRASOUND COMPLETE COMPARISON:  None. FINDINGS: Right Kidney: Renal measurements: 11.1 x 5.0 x 5.7 cm = volume: 164.6 mL. Diffusely increased cortical echogenicity. No concerning renal mass, shadowing calculus or hydronephrosis. Left Kidney: Renal measurements: 11.1 x 6.2 x 5.3 cm = volume: 187.3 mL. Diffusely increased renal cortical echogenicity. No concerning renal mass, shadowing calculus or hydronephrosis. Bladder: Appears normal for degree of bladder distention. Other: Technically challenging exam given patient's body habitus and bowel gas. IMPRESSION: 1. Diffusely increased renal cortical echogenicity bilaterally, compatible with medical renal disease. 2. Otherwise unremarkable renal ultrasound. 3. Technically challenging exam given bowel gas and body habitus. Electronically Signed   By: Lovena Le M.D.   On: 10/04/2020 03:18   Medications  . aspirin  81 mg Oral Daily  . clopidogrel  75 mg Oral Daily  . insulin aspart  0-15 Units Subcutaneous TID WC  . insulin aspart  0-5 Units Subcutaneous QHS  . insulin detemir  14 Units Subcutaneous BID  . sodium chloride flush  3 mL Intravenous Q12H  . sodium chloride flush  3 mL Intravenous Q12H    Assessment/Plan   1. NSTEMI - Known coronary disease with stent mid LAD and Prox RCA. Has been on repatha/plavix  - had NSTEMI in February. Cath deferred due to AKI. C - Developed chest pain  EKG changes noted ST depression V4/V5. HS 60>239 compatible with NSTEMI -3/17 received PCI DES to mid circ, balloon angioplasty to OM1  -No chest pain - Continue  DAPT w/ ASA + Plavix  - Check P2Y12.   2. AKI on CKD Stage IV - Recent AKI with up 3.4 during hospitalization in February.  - Creatinine trending down. Peak 5.5,  today 4.5  BUN 92 Likely due to CIN - Nephrology appreciated.   3. Acute on chronic Diastolic Heart Failure- Echo 08/2020 EF 55-60%  - diuretics on hold due to AKI. Received IVF for gastroparesis (now off) -Currently no indication for HD.   4. N/V due to severe gastroparesis -Improved with erythomycin.  - management per TRH. Thank you!  5. OSA Continue CPAP nightly.   6. Epistaxis.  - continue PRN Afrin - Can try topical TXA. Discussed dosing with PharmD personally. - Resolved.   7. DM2 - Better controlled.  - continue SSI  8. Chronic Anemia - Baseline Hgb 8-9 range, likely 2/2  renal disease  - exacerbated by Epistaxis. 3/20&3/21 received blood.  -6.7>7.8  - monitor for further bleeding - transfuse for Hgb < 7.0   11. Leukocytosis - WBK 17K but AF.  - ? Reactive from NSTEMI  - CXR 3/21 ? HF - Obtain UA/ UC  - Blood cultures pending.    Darrick Grinder, NP 10/04/2020, 8:17 AM  Advanced Heart Failure Team Pager [phone] (M-F; 7a - 5p)  Please contact [facility] for night-coverage after hours (4p -7a ) and weekends on [email or web address]  Patient seen and examined with the above-signed Advanced Practice Provider and/or Housestaff. I personally reviewed laboratory data, imaging studies and relevant notes. I independently examined the patient and formulated the important aspects of the plan. I have edited the note to reflect any of my changes or salient points. I have personally discussed the plan with the patient and/or family.  Feeling a bit better today. Responded well to IV lasix overnight with 2.5L out. Creatinine improving. Gastroparesis also improved and now able to hold down liquid diet. Denies CP or SOB. + LE edema  General:  Sitting up on side of bed . No resp difficulty HEENT: normal Neck: supple.JVP up Carotids 2+ bilat; no bruits. No lymphadenopathy or thryomegaly appreciated. Cor: PMI nondisplaced. Regular rate & rhythm. No rubs, gallops or murmurs. Lungs:  clear Abdomen: soft, nontender, nondistended. No hepatosplenomegaly. No bruits or masses. Good bowel sounds. Extremities: no cyanosis, clubbing, rash, 2+ edema Neuro: alert & orientedx3, cranial nerves grossly intact. moves all 4 extremities w/o difficulty. Affect pleasant  Renal failure improving. Remains volume overloaded. Will give one dose IV lasix today. Hgb stable.  No further epistaxis. Will check P2Y12 level to see if platelets well inhibited on Plavix.   [name], MD  12:28 PM

## 2020-10-04 NOTE — Progress Notes (Signed)
Hypoglycemic Event  CBG: 63  Treatment: 1 cup of orange juice  Symptoms: No symptoms  Follow-up CBG: Time: 0802 CBG Result:110  Possible Reasons for Event: Levemir dosage  Comments/MD notified: Bensimhon MD notified, Broadus John MD notified   Wilfred Curtis, RN

## 2020-10-05 DIAGNOSIS — I214 Non-ST elevation (NSTEMI) myocardial infarction: Secondary | ICD-10-CM | POA: Diagnosis not present

## 2020-10-05 DIAGNOSIS — R112 Nausea with vomiting, unspecified: Secondary | ICD-10-CM | POA: Diagnosis not present

## 2020-10-05 DIAGNOSIS — N184 Chronic kidney disease, stage 4 (severe): Secondary | ICD-10-CM | POA: Diagnosis not present

## 2020-10-05 DIAGNOSIS — E1142 Type 2 diabetes mellitus with diabetic polyneuropathy: Secondary | ICD-10-CM

## 2020-10-05 DIAGNOSIS — N179 Acute kidney failure, unspecified: Secondary | ICD-10-CM | POA: Diagnosis not present

## 2020-10-05 LAB — BASIC METABOLIC PANEL
Anion gap: 10 (ref 5–15)
BUN: 84 mg/dL — ABNORMAL HIGH (ref 6–20)
CO2: 25 mmol/L (ref 22–32)
Calcium: 8.1 mg/dL — ABNORMAL LOW (ref 8.9–10.3)
Chloride: 100 mmol/L (ref 98–111)
Creatinine, Ser: 3.47 mg/dL — ABNORMAL HIGH (ref 0.44–1.00)
GFR, Estimated: 15 mL/min — ABNORMAL LOW (ref >60)
Glucose, Bld: 137 mg/dL — ABNORMAL HIGH (ref 70–99)
Potassium: 3.4 mmol/L — ABNORMAL LOW (ref 3.5–5.1)
Sodium: 135 mmol/L (ref 135–145)

## 2020-10-05 LAB — CBC
HCT: 24.1 % — ABNORMAL LOW (ref 36.0–46.0)
Hemoglobin: 8 g/dL — ABNORMAL LOW (ref 12.0–15.0)
MCH: 27.8 pg (ref 26.0–34.0)
MCHC: 33.2 g/dL (ref 30.0–36.0)
MCV: 83.7 fL (ref 80.0–100.0)
Platelets: 325 10*3/uL (ref 150–400)
RBC: 2.88 MIL/uL — ABNORMAL LOW (ref 3.87–5.11)
RDW: 16.1 % — ABNORMAL HIGH (ref 11.5–15.5)
WBC: 18.9 10*3/uL — ABNORMAL HIGH (ref 4.0–10.5)
nRBC: 0 % (ref 0.0–0.2)

## 2020-10-05 LAB — CULTURE, BLOOD (ROUTINE X 2)
Culture: NO GROWTH
Culture: NO GROWTH
Special Requests: ADEQUATE
Special Requests: ADEQUATE

## 2020-10-05 LAB — GLUCOSE, CAPILLARY
Glucose-Capillary: 133 mg/dL — ABNORMAL HIGH (ref 70–99)
Glucose-Capillary: 213 mg/dL — ABNORMAL HIGH (ref 70–99)
Glucose-Capillary: 188 mg/dL — ABNORMAL HIGH (ref 70–99)
Glucose-Capillary: 194 mg/dL — ABNORMAL HIGH (ref 70–99)

## 2020-10-05 LAB — PLATELET INHIBITION P2Y12: Platelet Function  P2Y12: 208 [PRU] (ref 182–335)

## 2020-10-05 MED ORDER — FUROSEMIDE 10 MG/ML IJ SOLN
80.0000 mg | Freq: Once | INTRAMUSCULAR | Status: AC
  Administered 2020-10-05: 80 mg via INTRAVENOUS
  Filled 2020-10-05: qty 8

## 2020-10-05 MED ORDER — POTASSIUM CHLORIDE CRYS ER 20 MEQ PO TBCR
30.0000 meq | EXTENDED_RELEASE_TABLET | Freq: Four times a day (QID) | ORAL | Status: AC
  Administered 2020-10-05 (×2): 30 meq via ORAL
  Filled 2020-10-05 (×2): qty 1

## 2020-10-05 MED ORDER — HYDROCODONE-ACETAMINOPHEN 7.5-325 MG PO TABS
1.0000 | ORAL_TABLET | Freq: Three times a day (TID) | ORAL | Status: DC | PRN
Start: 2020-10-05 — End: 2020-10-06
  Administered 2020-10-05 – 2020-10-06 (×3): 1 via ORAL
  Filled 2020-10-05 (×3): qty 1

## 2020-10-05 NOTE — Progress Notes (Signed)
Pt is functioning at her baseline in ADL and ADL transfers. She has all necessary DME and is knowledgeable in energy conservation strategies. No further OT needs.    10/05/20 1000  OT Visit Information  Last OT Received On 10/05/20  Assistance Needed +1  History of Present Illness 60 y.o. female presenting to [facility] ED on 09/29/2020 with reports of severe centralized chest pain and pressure. Of note pt recently admitted for NSTEMI however unable to undergo heart cath due to AKI. Pt underwent L heart cath on 09/29/2020 and received PCI DES to mid circ, balloon angioplasty to OM1. Pt subsequently developed severe Nausea and vomiting with worsening renal failure. 3/20 pt with recurrent epistaxis and acute blood loss anemia. PMH of diastolic heart failure, CAD, DES RCA/LAD 2018, DMI, htn, hyperlipidemia, PAD, and OSA.  Precautions  Precautions Fall  Home Living  Family/patient expects to be discharged to: Private residence  Living Arrangements Spouse/significant other  Available Help at Discharge Family;Available PRN/intermittently  Type of [place] entrance  Home Layout Two level  Bathroom Shower/Tub Walk-in Engineer, materials Other (comment);Shower seat;Cane - single point;Walker - 2 wheels;Grab bars - tub/shower;Hand held shower head (02)  Prior Function  Level of Independence Independent with assistive device(s)  Comments sits to shower, furniture walks, uses cane if she goes out  Communication  Communication No difficulties  Pain Assessment  Faces Pain Scale 6  Pain Location low back and LEs  Pain Descriptors / Indicators Aching  Pain Intervention(s) Monitored during session;Repositioned  Cognition  Arousal/Alertness Awake/alert  Behavior During Therapy WFL for tasks assessed/performed  Overall Cognitive Status Within Functional Limits for tasks assessed  Upper Extremity Assessment  Upper Extremity Assessment Overall WFL for tasks  assessed  Lower Extremity Assessment  Lower Extremity Assessment Defer to PT evaluation  RLE Sensation history of peripheral neuropathy  LLE Sensation history of peripheral neuropathy  Cervical / Trunk Assessment  Cervical / Trunk Assessment Normal  ADL  Overall ADL's  At baseline  Vision- History  Baseline Vision/History Wears glasses  Wears Glasses At all times  Patient Visual Report No change from baseline  Bed Mobility  General bed mobility comments not assessed, pt received and left sitting at edge of bed  Transfers  Overall transfer level Needs assistance  Equipment used None  Transfers Sit to/from Stand  Sit to Stand Modified independent (Device/Increase time)  Balance  Sitting balance-Leahy Scale Good  Standing balance-Leahy Scale Fair  OT - End of Session  Activity Tolerance Patient tolerated treatment well  Patient left in bed;with call bell/phone within reach  OT Assessment  OT Recommendation/Assessment Patient does not need any further OT services  OT Visit Diagnosis Other abnormalities of gait and mobility (R26.89);Pain  AM-PAC OT "6 Clicks" Daily Activity Outcome Measure (Version 2)  Help from another person eating meals? 4  Help from another person taking care of personal grooming? 4  Help from another person toileting, which includes using toliet, bedpan, or urinal? 4  Help from another person bathing (including washing, rinsing, drying)? 4  Help from another person to put on and taking off regular upper body clothing? 4  Help from another person to put on and taking off regular lower body clothing? 4  6 Click Score 24  OT Recommendation  Follow Up Recommendations No OT follow up  OT Equipment None recommended by OT  Acute Rehab OT Goals  Patient Stated Goal to go home soon and attend  daughter's wedding this saturday  OT Time Calculation  OT Start Time (ACUTE ONLY) 1039  OT Stop Time (ACUTE ONLY) 1059  OT Time Calculation (min) 20 min  OT General Charges   $OT Visit 1 Visit  OT Evaluation  $OT Eval Low Complexity 1 Low  Written Expression  Dominant Hand Right  Nestor Lewandowsky, OTR/L Acute Rehabilitation Services Pager: [phone] Office: [phone]

## 2020-10-05 NOTE — Evaluation (Addendum)
Physical Therapy Evaluation Patient Details Name: Dana [name] MRN: [id number] DOB: 02/25/1961 Today's Date: 10/05/2020   History of Present Illness  60 y.o. female presenting to [facility] ED on 09/29/2020 with reports of severe centralized chest pain and pressure. Of note pt recently admitted for NSTEMI however unable to undergo heart cath due to AKI. Pt underwent L heart cath on 09/29/2020 and received PCI DES to mid circ, balloon angioplasty to OM1. Pt subsequently developed severe Nausea and vomiting with worsening renal failure. 3/20 pt with recurrent epistaxis and acute blood loss anemia. PMH of diastolic heart failure, CAD, DES RCA/LAD 2018, DMI, htn, hyperlipidemia, PAD, and OSA.  Clinical Impression  Pt presents to PT with deficits in functional mobility, gait, balance, endurance, power, and activity tolerance. Pt is limited by chronic low back pain and by SOB when ambulating, which significantly reduces her activity tolerance. Pt takes multiple standing breaks over short household distances due to pain. Pt will benefit from continued acute PT POC to improve mobility tolerance and reduce falls risk. PT recommends discharge home with a manual wheelchair, no PT follow-up necessary.  Patient suffers from chronic low back pain and SOB which impairs their ability to perform daily activities like ambulating in the home and community.  A walker alone will not resolve the issues with performing activities of daily living. A wheelchair will allow patient to safely perform daily activities.  The patient can self propel in the home or has a caregiver who can provide assistance.        Follow Up Recommendations No PT follow up;Supervision - Intermittent    Equipment Recommendations  Wheelchair (measurements PT)    Recommendations for Other Services       Precautions / Restrictions Precautions Precautions: Fall Precaution Comments: monitor SpO2 Restrictions Weight Bearing Restrictions: No       Mobility  Bed Mobility               General bed mobility comments: not assessed, pt received and left sitting at edge of bed    Transfers Overall transfer level: Needs assistance Equipment used: Rolling walker (2 wheeled) Transfers: Sit to/from Stand Sit to Stand: Supervision            Ambulation/Gait Ambulation/Gait assistance: Supervision Gait Distance (Feet): 120 Feet (additional trial of 80') Assistive device: Rolling walker (2 wheeled) Gait Pattern/deviations: Step-through pattern Gait velocity: reduced Gait velocity interpretation: <1.8 ft/sec, indicate of risk for recurrent falls General Gait Details: pt with slowed step through gait, multiple brief standing breaks due to pain  Stairs            Wheelchair Mobility    Modified Rankin (Stroke Patients Only)       Balance Overall balance assessment: Needs assistance Sitting-balance support: No upper extremity supported;Feet supported Sitting balance-Leahy Scale: Good     Standing balance support: No upper extremity supported Standing balance-Leahy Scale: Fair                               Pertinent Vitals/Pain Pain Assessment: Faces Faces Pain Scale: Hurts even more Pain Location: low back and LEs Pain Descriptors / Indicators: Aching Pain Intervention(s): Monitored during session    Home Living Family/patient expects to be discharged to:: Private residence Living Arrangements: Spouse/significant other Available Help at Discharge: Family;Available PRN/intermittently Type of Home: House Home Access: Ramped entrance     Home Layout: Two level (pt typically sleeps in basement but reports  she may transition to sleeping in bedroom on main level) Home Equipment: Walker - 2 wheels;Cane - single point      Prior Function Level of Independence: Independent with assistive device(s)         Comments: pt ambulates with cane outdoors for limited distances     Hand  Dominance        Extremity/Trunk Assessment   Upper Extremity Assessment Upper Extremity Assessment: Overall WFL for tasks assessed    Lower Extremity Assessment Lower Extremity Assessment: Generalized weakness and history of peripheral neuropathy bilaterally. 2 toe amputations on L foot.    Cervical / Trunk Assessment Cervical / Trunk Assessment: Normal  Communication   Communication: No difficulties  Cognition Arousal/Alertness: Awake/alert Behavior During Therapy: WFL for tasks assessed/performed Overall Cognitive Status: Within Functional Limits for tasks assessed                                        General Comments General comments (skin integrity, edema, etc.): pt desats too 86% when ambulating on RA, sats maintained at 92% and above on 2L [place]. Pt sats 92-96% when resting on RA    Exercises     Assessment/Plan    PT Assessment Patient needs continued PT services  PT Problem List Decreased activity tolerance;Decreased balance;Cardiopulmonary status limiting activity;Pain       PT Treatment Interventions DME instruction;Gait training;Stair training;Functional mobility training;Therapeutic activities;Balance training;Therapeutic exercise;Patient/family education    PT Goals (Current goals can be found in the Care Plan section)  Acute Rehab PT Goals Patient Stated Goal: to go home soon and attend daughter's wedding this saturday PT Goal Formulation: With patient Time For Goal Achievement: 10/19/20 Potential to Achieve Goals: Good Additional Goals Additional Goal #1: Pt will ambulate over 125' with DOE rating of 2/4 or less    Frequency Min 3X/week   Barriers to discharge        Co-evaluation               AM-PAC PT "6 Clicks" Mobility  Outcome Measure Help needed turning from your back to your side while in a flat bed without using bedrails?: None Help needed moving from lying on your back to sitting on the side of a flat bed without  using bedrails?: None Help needed moving to and from a bed to a chair (including a wheelchair)?: A Little Help needed standing up from a chair using your arms (e.g., wheelchair or bedside chair)?: A Little Help needed to walk in hospital room?: A Little Help needed climbing 3-5 steps with a railing? : A Little 6 Click Score: 20    End of Session Equipment Utilized During Treatment: Oxygen Activity Tolerance: Patient tolerated treatment well Patient left: in bed;with call bell/phone within reach Nurse Communication: Mobility status PT Visit Diagnosis: Other abnormalities of gait and mobility (R26.89)    Time: 8768-1157 PT Time Calculation (min) (ACUTE ONLY): 36 min   Charges:   PT Evaluation $PT Eval Low Complexity: 1 Low PT Treatments $Gait Training: 8-22 mins        Zenaida Niece, PT, DPT Acute Rehabilitation Pager: [phone]   Zenaida Niece 10/05/2020, 10:49 AM

## 2020-10-05 NOTE — Progress Notes (Addendum)
[name] Progress Note    Assessment/ Plan:    # Non-Oliguric AKI: 2/2 contrast induced nephropathy. Creatinine appears to have peaked and continues to improve. No additional intervention indicated at this time other than watchful waiting. Renal ultrasound showed medical renal disease only. She does have a nephrologist that she will be establishing care with next Thursday (Dr. Audie Clear, [facility], [facility])  # Hx of CKD Stage 3b: History of CKD dating back to at least 2018 likely in the setting of long-term previously uncontrolled T2DM with significant proteinuria (most recently 03/2020: ~3 g). There is likely an aspect of obesity related GN as well. No hx of biopsy.  -Continue to monitor daily Cr, Dose meds for GFR<15 -Monitor Daily I/Os, Daily weight  -Maintain MAP>65 for optimal renal perfusion.  -Avoid further nephrotoxins including NSAIDS, Morphine. Unless absolutely necessary, avoid CT with contrast and/or MRI with gadolinium.    -Currently no indication for HD   # Uremia: Small improvement in BUN in the past 24 hours. Uremic symptoms improving. Not convinced that her dysgeusia is a uremia related issue, maybe due to med effect?  # Type 2 Diabetes: 35 year history. Currently on insulin therapy with Farxiga for proteinuria. Follows with Endocrinology.  -Continue holding Tynan per primary  # NSTEMI s/p PCI  # Hx of Obstructive CAD -on DAPT  # Leukocytosis: Improving today. Infectious workup thus far negative.  -Continue to monitor  # Acute blood loss Anemia: Recent ABLA from epistaxis requiring a total of 2 units pRBCs. Hemoglobin stable # Anemia due to Renal Disease # Hx of Iron Deficiency Anemia: Iron panel without evidence of IDA a this time.  -Transfuse for Hgb<7 g/dL  # Hypertension -BB being held  -Currently being managed with Hydralazine, Imdur  # HLD - Managed with Zetia and Repatha.   # OSA - CPAP at night  # Acute on  Chronic Gastroparesis: Resolved   Will sign off from a nephrology perspective. Thank you for involving [name] in the care of this patient. Please call with any questions/concerns.  Gean Quint, MD [name]  Subjective:    No acute events. Continues to feel better especially from a volume standpoint. Still endorses some dysgeusia but no longer having nausea/vomiting. Urine output charted as 1L but none charted overnight. Cr down to 3.5.   Objective:   BP (!) 136/41 (BP Location: Left Arm)   Pulse 81   Temp 98.1 F (36.7 C) (Oral)   Resp 18   Ht 5\' 5"  (1.651 m)   Wt 109.4 kg   SpO2 92%   BMI 40.12 kg/m   Intake/Output Summary (Last 24 hours) at 10/05/2020 1440 Last data filed at 10/05/2020 0800 Gross per 24 hour  Intake 595 ml  Output --  Net 595 ml   Weight change: -3.946 kg  Physical Exam: Gen: Resting comfortably. No acute distress.  CVS:RRR. No murmurs.  Resp:No increased work of breathing.  Abd: obese, soft Ext: trace pitting edema bilateral LE's Neuro: speech clear and coherent, moves all extremities spontaneously, no asterixis  Imaging: DG Chest 2 View  Result Date: 10/03/2020 CLINICAL DATA:  Leukocytosis EXAM: CHEST - 2 VIEW COMPARISON:  09/29/2020, CT 08/29/2020 FINDINGS: Diffuse hazy interstitial opacities are present throughout both lungs with cephalization and congestion of the pulmonary vascularity. No pneumothorax. No visible effusion. Stable cardiomegaly. Metallic implant again seen in the left hilar pulmonary arteries. No acute osseous or soft tissue abnormality. Degenerative changes are present in the imaged spine  and shoulders. Telemetry leads overlie the chest. IMPRESSION: Stable cardiomegaly and features most suggestive of CHF with pulmonary edema though underlying infection is difficult to fully exclude. Electronically Signed   By: Lovena Le M.D.   On: 10/03/2020 23:06   US RENAL  Result Date: 10/04/2020 CLINICAL DATA:  Acute kidney  injury, CKD stage IV EXAM: RENAL / URINARY TRACT ULTRASOUND COMPLETE COMPARISON:  None. FINDINGS: Right Kidney: Renal measurements: 11.1 x 5.0 x 5.7 cm = volume: 164.6 mL. Diffusely increased cortical echogenicity. No concerning renal mass, shadowing calculus or hydronephrosis. Left Kidney: Renal measurements: 11.1 x 6.2 x 5.3 cm = volume: 187.3 mL. Diffusely increased renal cortical echogenicity. No concerning renal mass, shadowing calculus or hydronephrosis. Bladder: Appears normal for degree of bladder distention. Other: Technically challenging exam given patient's body habitus and bowel gas. IMPRESSION: 1. Diffusely increased renal cortical echogenicity bilaterally, compatible with medical renal disease. 2. Otherwise unremarkable renal ultrasound. 3. Technically challenging exam given bowel gas and body habitus. Electronically Signed   By: Lovena Le M.D.   On: 10/04/2020 03:18    Labs: BMET Recent Labs  Lab 09/29/20 1955 09/30/20 0241 10/01/20 0224 10/02/20 0201 10/03/20 0509 10/04/20 0240 10/05/20 0219  NA 135 132* 133* 133* 135 138 135  K 4.0 3.9 3.9 4.1 4.4 3.6 3.4*  CL 102 99 98 98 103 105 100  CO2 23 22 21* 19* 21* 23 25  GLUCOSE 159* 325* 270* 302* 169* 79 137*  BUN 47* 49* 63* 76* 98* 92* 84*  CREATININE 1.94* 2.56* 3.94* 4.88* 5.48* 4.53* 3.47*  CALCIUM 8.3* 8.0* 8.8* 8.7* 8.1* 8.2* 8.1*   CBC Recent Labs  Lab 10/03/20 0509 10/03/20 1526 10/04/20 0240 10/05/20 0219  WBC 24.3* 19.8* 17.3* 18.9*  NEUTROABS 20.4*  --   --   --   HGB 7.1* 6.7* 7.8* 8.0*  HCT 21.9* 20.3* 23.7* 24.1*  MCV 84.6 83.5 84.3 83.7  PLT 287 290 298 325    Medications:    . aspirin  81 mg Oral Daily  . carvedilol  3.125 mg Oral BID WC  . clopidogrel  75 mg Oral Daily  . insulin aspart  0-15 Units Subcutaneous TID WC  . insulin aspart  0-5 Units Subcutaneous QHS  . insulin detemir  10 Units Subcutaneous Daily  . potassium chloride  30 mEq Oral Q6H  . sodium chloride flush  3 mL  Intravenous Q12H  . sodium chloride flush  3 mL Intravenous Q12H

## 2020-10-05 NOTE — Progress Notes (Signed)
SATURATION QUALIFICATIONS: (This note is used to comply with regulatory documentation for home oxygen)  Patient Saturations on Room Air at Rest = 93%  Patient Saturations on Room Air while Ambulating = 88%  Patient Saturations on 2 Liters of oxygen while Ambulating = 95%  Please briefly explain why patient needs home oxygen: desat with walking

## 2020-10-05 NOTE — Progress Notes (Signed)
Patient has home CPAP at bedside and is able to apply when she is ready for bed.

## 2020-10-05 NOTE — Progress Notes (Addendum)
Advanced Heart Failure Team Rounding Note   PCP:  [name], MD  PCP-Cardiology: [name], MD     Reason for Admission: Chest Pain, NSTEMI    HPI:    3/17-admitted for NSTEMI received PCI DES to mid circ, balloon angioplasty to OM1 3/20 & 3/21 received 1UPRBCs. -->Hgb 7.8   3/21 received dose of IV lasix. >2.5 urine output.   Creatinine trend 2.2 > 2.5 > 3.9 > 4.9>>5.5>>4.5>>3.5  Received 80mg  IV lasix yesterday with good output weight down 9lbs not sure if accurate.  Walked with PT today, and desaturated to 88%   Objective:    Vital Signs:   Temp:  [98 F (36.7 C)-98.3 F (36.8 C)] 98.1 F (36.7 C) (03/23 0824) Pulse Rate:  [76-81] 81 (03/23 0824) Resp:  [16-18] 18 (03/23 0824) BP: (136-158)/(41-59) 136/41 (03/23 0824) SpO2:  [92 %-99 %] 92 % (03/23 0824) Weight:  [109.4 kg] 109.4 kg (03/23 0500) Last BM Date: 10/04/20 Filed Weights   10/03/20 0500 10/04/20 0546 10/05/20 0500  Weight: 110.8 kg 113.3 kg 109.4 kg     Physical Exam   Cardiac: JVD to jaw, normal rate and rhythm, clear s1 and s2, no murmurs, rubs or gallops, bilateral 2+ LE edema Pulmonary: bibasilar rales, not in distress Abdominal: non distended abdomen, soft and nontender Psych: Alert, conversant, in good spirits   Telemetry  SR 70's-80's  personally reviewed.   Labs     Basic Metabolic Panel: Recent Labs  Lab 09/29/20 1955 09/30/20 0241 10/01/20 0224 10/01/20 2300 10/02/20 0201 10/03/20 0509 10/04/20 0240 10/05/20 0219  NA 135   < > 133*  --  133* 135 138 135  K 4.0   < > 3.9  --  4.1 4.4 3.6 3.4*  CL 102   < > 98  --  98 103 105 100  CO2 23   < > 21*  --  19* 21* 23 25  GLUCOSE 159*   < > 270*  --  302* 169* 79 137*  BUN 47*   < > 63*  --  76* 98* 92* 84*  CREATININE 1.94*   < > 3.94*  --  4.88* 5.48* 4.53* 3.47*  CALCIUM 8.3*   < > 8.8*  --  8.7* 8.1* 8.2* 8.1*  MG 2.1  --   --  2.2  --   --   --   --    < > = values in this interval not displayed.    Liver  Function Tests: Recent Labs  Lab 09/29/20 0636 10/01/20 2300  AST 20 16  ALT 20 16  ALKPHOS 130* 105  BILITOT 1.1 0.9  PROT 7.7 7.4  ALBUMIN 3.3* 3.0*   Recent Labs  Lab 09/29/20 0636  LIPASE 28   No results for input(s): AMMONIA in the last 168 hours.  CBC: Recent Labs  Lab 10/01/20 2300 10/02/20 1508 10/02/20 2240 10/03/20 0509 10/03/20 1526 10/04/20 0240 10/05/20 0219  WBC 20.6*  --   --  24.3* 19.8* 17.3* 18.9*  NEUTROABS  --   --   --  20.4*  --   --   --   HGB 8.3*   < > 7.5* 7.1* 6.7* 7.8* 8.0*  HCT 25.4*   < > 23.1* 21.9* 20.3* 23.7* 24.1*  MCV 81.9  --   --  84.6 83.5 84.3 83.7  PLT 318  --   --  287 290 298 325   < > = values in  this interval not displayed.    Cardiac Enzymes: No results for input(s): CKTOTAL, CKMB, CKMBINDEX, TROPONINI in the last 168 hours.  BNP: BNP (last 3 results) Recent Labs    08/29/20 0636 09/29/20 0724  BNP 87.6 268.7*    ProBNP (last 3 results) No results for input(s): PROBNP in the last 8760 hours.   CBG: Recent Labs  Lab 10/04/20 0802 10/04/20 1126 10/04/20 1655 10/04/20 2116 10/05/20 0721  GLUCAP 110* 104* 133* 160* 133*    Coagulation Studies: No results for input(s): LABPROT, INR in the last 72 hours.  Imaging: DG Chest 2 View  Result Date: 10/03/2020 CLINICAL DATA:  Leukocytosis EXAM: CHEST - 2 VIEW COMPARISON:  09/29/2020, CT 08/29/2020 FINDINGS: Diffuse hazy interstitial opacities are present throughout both lungs with cephalization and congestion of the pulmonary vascularity. No pneumothorax. No visible effusion. Stable cardiomegaly. Metallic implant again seen in the left hilar pulmonary arteries. No acute osseous or soft tissue abnormality. Degenerative changes are present in the imaged spine and shoulders. Telemetry leads overlie the chest. IMPRESSION: Stable cardiomegaly and features most suggestive of CHF with pulmonary edema though underlying infection is difficult to fully exclude.  Electronically Signed   By: Lovena Le M.D.   On: 10/03/2020 23:06   US RENAL  Result Date: 10/04/2020 CLINICAL DATA:  Acute kidney injury, CKD stage IV EXAM: RENAL / URINARY TRACT ULTRASOUND COMPLETE COMPARISON:  None. FINDINGS: Right Kidney: Renal measurements: 11.1 x 5.0 x 5.7 cm = volume: 164.6 mL. Diffusely increased cortical echogenicity. No concerning renal mass, shadowing calculus or hydronephrosis. Left Kidney: Renal measurements: 11.1 x 6.2 x 5.3 cm = volume: 187.3 mL. Diffusely increased renal cortical echogenicity. No concerning renal mass, shadowing calculus or hydronephrosis. Bladder: Appears normal for degree of bladder distention. Other: Technically challenging exam given patient's body habitus and bowel gas. IMPRESSION: 1. Diffusely increased renal cortical echogenicity bilaterally, compatible with medical renal disease. 2. Otherwise unremarkable renal ultrasound. 3. Technically challenging exam given bowel gas and body habitus. Electronically Signed   By: Lovena Le M.D.   On: 10/04/2020 03:18   Medications  . aspirin  81 mg Oral Daily  . carvedilol  3.125 mg Oral BID WC  . clopidogrel  75 mg Oral Daily  . insulin aspart  0-15 Units Subcutaneous TID WC  . insulin aspart  0-5 Units Subcutaneous QHS  . insulin detemir  10 Units Subcutaneous Daily  . sodium chloride flush  3 mL Intravenous Q12H  . sodium chloride flush  3 mL Intravenous Q12H    Assessment/Plan   1. NSTEMI - Known coronary disease with stent mid LAD and Prox RCA. Has been on repatha/plavix  - had NSTEMI in February. Cath deferred due to AKI. C - Developed chest pain  EKG changes noted ST depression V4/V5. HS 60>239 compatible with NSTEMI -3/17 received PCI DES to mid circ, balloon angioplasty to OM1  -No chest pain - Continue  DAPT w/ ASA + Plavix   2. AKI on CKD Stage IV - Recent AKI with up 3.4 during hospitalization in February.  - Creatinine trending down. Peak 5.5>>4.5>>3.5 Likely due to CIN -  Nephrology appreciated.   3. Acute on chronic Diastolic Heart Failure- Echo 08/2020 EF 55-60%  - Received IVF for gastroparesis (now off) -Currently no indication for HD.  -remains volume overloaded repeat lasix today  4. N/V due to severe gastroparesis -now resolved  5. OSA Continue CPAP nightly.   6. Epistaxis.  - continue PRN Afrin - Can try topical  TXA. Discussed dosing with PharmD personally. - Resolved.   7. DM2 - Better controlled.  - continue SSI  8. Chronic Anemia - Baseline Hgb 8-9 range, likely 2/2 renal disease  - exacerbated by Epistaxis. 3/20&3/21 received blood.  - 6.7>7.8> 8.0 - monitor for further bleeding - transfuse for Hgb < 7.0   11. Leukocytosis - WBK 17K but AF.  - ? Reactive from NSTEMI vs  - CXR 3/21 ? HF - UA/UC not consistent with infection   - Blood cultures NGTD  Katherine Roan, MD 10/05/2020, 10:47 AM  Advanced Heart Failure Team Pager [phone] (M-F; 7a - 5p)  Please contact [facility] for night-coverage after hours (4p -7a ) and weekends on [email or web address]   Patient seen and examined with the above-signed Advanced Practice Provider and/or Housestaff. I personally reviewed laboratory data, imaging studies and relevant notes. I independently examined the patient and formulated the important aspects of the plan. I have edited the note to reflect any of my changes or salient points. I have personally discussed the plan with the patient and/or family.  Diuresing well on IV lasix. No further CP. Creatinine getting better. Had episode of severe transient upper abdominal pai this evening. Now resolved. P2Y12 208.   General:  Sitting up in bed . No resp difficulty HEENT: normal Neck: supple.JVP 10 . Carotids 2+ bilat; no bruits. No lymphadenopathy or thryomegaly appreciated. Cor: PMI nondisplaced. Regular rate & rhythm. No rubs, gallops or murmurs. Lungs: clear Abdomen: soft, nontender, nondistended. No hepatosplenomegaly. No bruits or  masses. Good bowel sounds. Extremities: no cyanosis, clubbing, rash, 1+ edema Neuro: alert & orientedx3, cranial nerves grossly intact. moves all 4 extremities w/o difficulty. Affect pleasant\\  She continues to improve. Remains volume overloaded. Continue IV lasix one more day. Hopefully home in am.   [name], MD  10:25 PM

## 2020-10-05 NOTE — Progress Notes (Signed)
PROGRESS NOTE    DANNY ZIMNY  CZY:[id number] DOB: [birth year] DOA: 09/29/2020 PCP: [name], MD     Brief Narrative:  LAQUASHA GROOME is a 60 year old female with history of type 2 diabetes mellitus with gastroparesis and peripheral neuropathy, morbid obesity, BMI of 41, stage IIIb chronic kidney disease, CAD who was admitted with NSTEMI. She was recently hospitalized last month for same and treated medically at the time, on 3/17 underwent PCI-DESto mid circumflex and balloon angioplasty to OM1. Subsequently developed severe nausea and vomiting and worsening renal failure. TRH consulted for medical management.   New events last 24 hours / Subjective: Patient completed her breakfast tray, denies any further nausea or vomiting.  Denies any abdominal pain.  Has not had any epistaxis in the last couple of days.  Asking to stop fentanyl and wants to be back on her home hydrocodone as well as allopurinol.  Assessment & Plan:   Active Problems:   Chronic pain syndrome   COPD (chronic obstructive pulmonary disease) (HCC)   Diabetic polyneuropathy associated with type 2 diabetes mellitus (HCC)   Chest pain   ACS (acute coronary syndrome) ([place])   NSTEMI -Per primary -Continue aspiring, plavix, coreg  Gastroparesis  -Improving, tolerated diet this morning -Now off erythromycin   AKI on CKD 4 -Secondary to contrast-induced nephropathy, ATN -Nephrology following -Improving  Severe epistaxis Acute blood loss anemia -Status post 2 unit packed red blood cell transfusion -Epistaxis resolved   Acute on chronic diastolic CHF -Volume status improving, received lasix yesterday   Type 2 diabetes mellitus, well controlled -Hemoglobin A1c 6.8 -Levemir, SSI   Obesity Estimated body mass index is 40.12 kg/m as calculated from the following:   Height as of this encounter: 5\' 5"  (1.651 m).   Weight as of this encounter: 109.4 kg.  OSA -Continue CPAP nightly  Leukocytosis -?   Reactive.  No signs of infectious process at this time.  Blood cultures negative.  Continue to watch for fever    Antimicrobials:  Anti-infectives (From admission, onward)   Start     Dose/Rate Route Frequency Ordered Stop   10/01/20 1830  erythromycin 250 mg in sodium chloride 0.9 % 100 mL IVPB  Status:  Discontinued        250 mg 100 mL/hr over 60 Minutes Intravenous Every 8 hours 10/01/20 1724 10/04/20 0827        Objective: Vitals:   10/04/20 1520 10/04/20 1951 10/05/20 0500 10/05/20 0824  BP: (!) 149/59 (!) 136/52 (!) 158/56 (!) 136/41  Pulse: 79 77 79 81  Resp: 16 18  18   Temp: 98.1 F (36.7 C) 98.3 F (36.8 C) 98 F (36.7 C) 98.1 F (36.7 C)  TempSrc: Oral Oral Oral Oral  SpO2: 97% 96% 99% 92%  Weight:   109.4 kg   Height:        Intake/Output Summary (Last 24 hours) at 10/05/2020 1027 Last data filed at 10/05/2020 0800 Gross per 24 hour  Intake 595 ml  Output 1000 ml  Net -405 ml   Filed Weights   10/03/20 0500 10/04/20 0546 10/05/20 0500  Weight: 110.8 kg 113.3 kg 109.4 kg    Examination:  General exam: Appears calm and comfortable  Respiratory system: Clear to auscultation. Respiratory effort normal. No respiratory distress. No conversational dyspnea.  Cardiovascular system: S1 & S2 heard, RRR. No murmurs. No pedal edema. Gastrointestinal system: Abdomen is nondistended, soft and nontender. Normal bowel sounds heard. Central nervous system: Alert and oriented. No focal  neurological deficits. Speech clear.  Extremities: Symmetric in appearance  Skin: No rashes, lesions or ulcers on exposed skin  Psychiatry: Judgement and insight appear normal. Mood & affect appropriate.   Data Reviewed: I have personally reviewed following labs and imaging studies  CBC: Recent Labs  Lab 10/01/20 2300 10/02/20 1508 10/02/20 2240 10/03/20 0509 10/03/20 1526 10/04/20 0240 10/05/20 0219  WBC 20.6*  --   --  24.3* 19.8* 17.3* 18.9*  NEUTROABS  --   --   --  20.4*   --   --   --   HGB 8.3*   < > 7.5* 7.1* 6.7* 7.8* 8.0*  HCT 25.4*   < > 23.1* 21.9* 20.3* 23.7* 24.1*  MCV 81.9  --   --  84.6 83.5 84.3 83.7  PLT 318  --   --  287 290 298 325   < > = values in this interval not displayed.   Basic Metabolic Panel: Recent Labs  Lab 09/29/20 1955 09/30/20 0241 10/01/20 0224 10/01/20 2300 10/02/20 0201 10/03/20 0509 10/04/20 0240 10/05/20 0219  NA 135   < > 133*  --  133* 135 138 135  K 4.0   < > 3.9  --  4.1 4.4 3.6 3.4*  CL 102   < > 98  --  98 103 105 100  CO2 23   < > 21*  --  19* 21* 23 25  GLUCOSE 159*   < > 270*  --  302* 169* 79 137*  BUN 47*   < > 63*  --  76* 98* 92* 84*  CREATININE 1.94*   < > 3.94*  --  4.88* 5.48* 4.53* 3.47*  CALCIUM 8.3*   < > 8.8*  --  8.7* 8.1* 8.2* 8.1*  MG 2.1  --   --  2.2  --   --   --   --    < > = values in this interval not displayed.   GFR: Estimated Creatinine Clearance: 22 mL/min (A) (by C-G formula based on SCr of 3.47 mg/dL (H)). Liver Function Tests: Recent Labs  Lab 09/29/20 0636 10/01/20 2300  AST 20 16  ALT 20 16  ALKPHOS 130* 105  BILITOT 1.1 0.9  PROT 7.7 7.4  ALBUMIN 3.3* 3.0*   Recent Labs  Lab 09/29/20 0636  LIPASE 28   No results for input(s): AMMONIA in the last 168 hours. Coagulation Profile: No results for input(s): INR, PROTIME in the last 168 hours. Cardiac Enzymes: No results for input(s): CKTOTAL, CKMB, CKMBINDEX, TROPONINI in the last 168 hours. BNP (last 3 results) No results for input(s): PROBNP in the last 8760 hours. HbA1C: No results for input(s): HGBA1C in the last 72 hours. CBG: Recent Labs  Lab 10/04/20 0802 10/04/20 1126 10/04/20 1655 10/04/20 2116 10/05/20 0721  GLUCAP 110* 104* 133* 160* 133*   Lipid Profile: No results for input(s): CHOL, HDL, LDLCALC, TRIG, CHOLHDL, LDLDIRECT in the last 72 hours. Thyroid Function Tests: No results for input(s): TSH, T4TOTAL, FREET4, T3FREE, THYROIDAB in the last 72 hours. Anemia Panel: Recent Labs     10/03/20 0752  VITAMINB12 196  FOLATE 10.0  FERRITIN 287  TIBC 224*  IRON 33  RETICCTPCT 1.9   Sepsis Labs: Recent Labs  Lab 09/29/20 0920 09/29/20 1907  LATICACIDVEN 1.1 1.5    Recent Results (from the past 240 hour(s))  Resp Panel by RT-PCR (Flu A&B, Covid) Nasopharyngeal Swab     Status: None   Collection Time: 09/29/20  9:20 AM   Specimen: Nasopharyngeal Swab; Nasopharyngeal(NP) swabs in vial transport medium  Result Value Ref Range Status   SARS Coronavirus 2 by RT PCR NEGATIVE NEGATIVE Final    Comment: (NOTE) SARS-CoV-2 target nucleic acids are NOT DETECTED.  The SARS-CoV-2 RNA is generally detectable in upper respiratory specimens during the acute phase of infection. The lowest concentration of SARS-CoV-2 viral copies this assay can detect is 138 copies/mL. A negative result does not preclude SARS-Cov-2 infection and should not be used as the sole basis for treatment or other patient management decisions. A negative result may occur with  improper specimen collection/handling, submission of specimen other than nasopharyngeal swab, presence of viral mutation(s) within the areas targeted by this assay, and inadequate number of viral copies(<138 copies/mL). A negative result must be combined with clinical observations, patient history, and epidemiological information. The expected result is Negative.  Fact Sheet for Patients:  EntrepreneurPulse.com.au  Fact Sheet for Healthcare Providers:  IncredibleEmployment.be  This test is no t yet approved or cleared by the [place] FDA and  has been authorized for detection and/or diagnosis of SARS-CoV-2 by FDA under an Emergency Use Authorization (EUA). This EUA will remain  in effect (meaning this test can be used) for the duration of the COVID-19 declaration under Section 564(b)(1) of the Act, 21 U.S.C.section 360bbb-3(b)(1), unless the authorization is terminated  or revoked  sooner.       Influenza A by PCR NEGATIVE NEGATIVE Final   Influenza B by PCR NEGATIVE NEGATIVE Final    Comment: (NOTE) The Xpert Xpress SARS-CoV-2/FLU/RSV plus assay is intended as an aid in the diagnosis of influenza from Nasopharyngeal swab specimens and should not be used as a sole basis for treatment. Nasal washings and aspirates are unacceptable for Xpert Xpress SARS-CoV-2/FLU/RSV testing.  Fact Sheet for Patients: EntrepreneurPulse.com.au  Fact Sheet for Healthcare Providers: IncredibleEmployment.be  This test is not yet approved or cleared by the [place] FDA and has been authorized for detection and/or diagnosis of SARS-CoV-2 by FDA under an Emergency Use Authorization (EUA). This EUA will remain in effect (meaning this test can be used) for the duration of the COVID-19 declaration under Section 564(b)(1) of the Act, 21 U.S.C. section 360bbb-3(b)(1), unless the authorization is terminated or revoked.  Performed at [facility], [place] [address] [place], [place] [address]   Culture, blood (Routine X 2) w Reflex to ID Panel     Status: None   Collection Time: 09/29/20  7:55 PM   Specimen: BLOOD RIGHT HAND  Result Value Ref Range Status   Specimen Description BLOOD RIGHT HAND  Final   Special Requests   Final    BOTTLES DRAWN AEROBIC AND ANAEROBIC Blood Culture adequate volume   Culture   Final    NO GROWTH 5 DAYS Performed at [facility], [place] [address], [place], [place] [address]    Report Status 10/05/2020 FINAL  Final  Culture, blood (Routine X 2) w Reflex to ID Panel     Status: None   Collection Time: 09/29/20  7:55 PM   Specimen: BLOOD LEFT HAND  Result Value Ref Range Status   Specimen Description BLOOD LEFT HAND  Final   Special Requests   Final    BOTTLES DRAWN AEROBIC AND ANAEROBIC Blood Culture adequate volume   Culture   Final    NO GROWTH 5 DAYS Performed at [facility], [place] [address] [place], [place] [address]    Report  Status 10/05/2020 FINAL  Final  Culture, Urine     Status: None   Collection Time: 10/03/20 11:15 AM   Specimen: Urine, Random  Result Value Ref Range Status   Specimen Description URINE, RANDOM  Final   Special Requests NONE  Final   Culture   Final    NO GROWTH Performed at [facility], [address] [place], [place] [address]    Report Status 10/04/2020 FINAL  Final  Culture, blood (routine x 2)     Status: None (Preliminary result)   Collection Time: 10/03/20 11:41 AM   Specimen: BLOOD  Result Value Ref Range Status   Specimen Description BLOOD RIGHT ANTECUBITAL  Final   Special Requests   Final    BOTTLES DRAWN AEROBIC AND ANAEROBIC Blood Culture results may not be optimal due to an inadequate volume of blood received in culture bottles   Culture   Final    NO GROWTH 2 DAYS Performed at [facility], [place] [address] [place], [place] [address]    Report Status PENDING  Incomplete  Culture, blood (routine x 2)     Status: None (Preliminary result)   Collection Time: 10/03/20 11:41 AM   Specimen: BLOOD LEFT HAND  Result Value Ref Range Status   Specimen Description BLOOD LEFT HAND  Final   Special Requests   Final    BOTTLES DRAWN AEROBIC AND ANAEROBIC Blood Culture results may not be optimal due to an inadequate volume of blood received in culture bottles   Culture   Final    NO GROWTH 2 DAYS Performed at [facility], [place] [address], [place], [place] [address]    Report Status PENDING  Incomplete      Radiology Studies: DG Chest 2 View  Result Date: 10/03/2020 CLINICAL DATA:  Leukocytosis EXAM: CHEST - 2 VIEW COMPARISON:  09/29/2020, CT 08/29/2020 FINDINGS: Diffuse hazy interstitial opacities are present throughout both lungs with cephalization and congestion of the pulmonary vascularity. No pneumothorax. No visible effusion. Stable cardiomegaly. Metallic implant again seen in the left hilar pulmonary arteries.  No acute osseous or soft tissue abnormality. Degenerative changes are present in the imaged spine and shoulders. Telemetry leads overlie the chest. IMPRESSION: Stable cardiomegaly and features most suggestive of CHF with pulmonary edema though underlying infection is difficult to fully exclude. Electronically Signed   By: Lovena Le M.D.   On: 10/03/2020 23:06   US RENAL  Result Date: 10/04/2020 CLINICAL DATA:  Acute kidney injury, CKD stage IV EXAM: RENAL / URINARY TRACT ULTRASOUND COMPLETE COMPARISON:  None. FINDINGS: Right Kidney: Renal measurements: 11.1 x 5.0 x 5.7 cm = volume: 164.6 mL. Diffusely increased cortical echogenicity. No concerning renal mass, shadowing calculus or hydronephrosis. Left Kidney: Renal measurements: 11.1 x 6.2 x 5.3 cm = volume: 187.3 mL. Diffusely increased renal cortical echogenicity. No concerning renal mass, shadowing calculus or hydronephrosis. Bladder: Appears normal for degree of bladder distention. Other: Technically challenging exam given patient's body habitus and bowel gas. IMPRESSION: 1. Diffusely increased renal cortical echogenicity bilaterally, compatible with medical renal disease. 2. Otherwise unremarkable renal ultrasound. 3. Technically challenging exam given bowel gas and body habitus. Electronically Signed   By: Lovena Le M.D.   On: 10/04/2020 03:18      Scheduled Meds: . aspirin  81 mg Oral Daily  . carvedilol  3.125 mg Oral BID WC  . clopidogrel  75 mg Oral Daily  . insulin aspart  0-15 Units Subcutaneous TID WC  .  insulin aspart  0-5 Units Subcutaneous QHS  . insulin detemir  10 Units Subcutaneous Daily  . sodium chloride flush  3 mL Intravenous Q12H  . sodium chloride flush  3 mL Intravenous Q12H   Continuous Infusions: . sodium chloride       LOS: 6 days      Time spent: 30 minutes   Dessa Phi, DO [facility] 10/05/2020, 10:27 AM   Available via Epic secure chat 7am-7pm After these hours, please refer to  coverage provider listed on [email or web address]

## 2020-10-05 NOTE — Progress Notes (Signed)
CARDIAC REHAB PHASE I   PRE:  Rate/Rhythm: 75 SR  BP:  Supine:   Sitting: 119/50  Standing:    SaO2: 93%RA  MODE:  Ambulation: 60 ft   POST:  Rate/Rhythm: 86 SR  BP:  Supine:   Sitting: 140/53  Standing:    SaO2: 88% RA hall.  95% 2L 1122-1205 Pt walked with PT earlier and now with CR. Pt c/o knees and feet hurting with arthritis and weather not helping. PT recommends wheelchair for home and I wrote qualifying note for oxygen. Sats maintained for a little while but toward end of walk started dropping. Put on 2L to finish walk. Pt desat with PT note also. Patient stopped several times during walk to rest due to arthritic pain. To sitting on side of bed after walk.  Total of 60 ft with rolling walker.   Graylon Good, RN BSN  10/05/2020 12:02 PM

## 2020-10-06 ENCOUNTER — Other Ambulatory Visit (HOSPITAL_COMMUNITY): Payer: Self-pay | Admitting: *Deleted

## 2020-10-06 ENCOUNTER — Ambulatory Visit (INDEPENDENT_AMBULATORY_CARE_PROVIDER_SITE_OTHER): Payer: HMO | Admitting: Family Medicine

## 2020-10-06 DIAGNOSIS — I214 Non-ST elevation (NSTEMI) myocardial infarction: Secondary | ICD-10-CM | POA: Diagnosis not present

## 2020-10-06 DIAGNOSIS — N179 Acute kidney failure, unspecified: Secondary | ICD-10-CM | POA: Diagnosis not present

## 2020-10-06 DIAGNOSIS — E1142 Type 2 diabetes mellitus with diabetic polyneuropathy: Secondary | ICD-10-CM

## 2020-10-06 DIAGNOSIS — I5032 Chronic diastolic (congestive) heart failure: Secondary | ICD-10-CM

## 2020-10-06 DIAGNOSIS — N184 Chronic kidney disease, stage 4 (severe): Secondary | ICD-10-CM | POA: Diagnosis not present

## 2020-10-06 DIAGNOSIS — R112 Nausea with vomiting, unspecified: Secondary | ICD-10-CM | POA: Diagnosis not present

## 2020-10-06 LAB — GLUCOSE, CAPILLARY
Glucose-Capillary: 132 mg/dL — ABNORMAL HIGH (ref 70–99)
Glucose-Capillary: 180 mg/dL — ABNORMAL HIGH (ref 70–99)

## 2020-10-06 LAB — CBC
HCT: 24.9 % — ABNORMAL LOW (ref 36.0–46.0)
Hemoglobin: 8.4 g/dL — ABNORMAL LOW (ref 12.0–15.0)
MCH: 28.3 pg (ref 26.0–34.0)
MCHC: 33.7 g/dL (ref 30.0–36.0)
MCV: 83.8 fL (ref 80.0–100.0)
Platelets: 345 10*3/uL (ref 150–400)
RBC: 2.97 MIL/uL — ABNORMAL LOW (ref 3.87–5.11)
RDW: 16.4 % — ABNORMAL HIGH (ref 11.5–15.5)
WBC: 18 10*3/uL — ABNORMAL HIGH (ref 4.0–10.5)
nRBC: 0 % (ref 0.0–0.2)

## 2020-10-06 LAB — BASIC METABOLIC PANEL
Anion gap: 9 (ref 5–15)
BUN: 74 mg/dL — ABNORMAL HIGH (ref 6–20)
CO2: 26 mmol/L (ref 22–32)
Calcium: 8.2 mg/dL — ABNORMAL LOW (ref 8.9–10.3)
Chloride: 101 mmol/L (ref 98–111)
Creatinine, Ser: 2.94 mg/dL — ABNORMAL HIGH (ref 0.44–1.00)
GFR, Estimated: 18 mL/min — ABNORMAL LOW (ref >60)
Glucose, Bld: 149 mg/dL — ABNORMAL HIGH (ref 70–99)
Potassium: 4 mmol/L (ref 3.5–5.1)
Sodium: 136 mmol/L (ref 135–145)

## 2020-10-06 MED ORDER — HYDRALAZINE HCL 25 MG PO TABS: 25.0000 mg | ORAL_TABLET | Freq: Three times a day (TID) | ORAL | 0 refills | Status: DC

## 2020-10-06 MED ORDER — CARVEDILOL 12.5 MG PO TABS: 6.2500 mg | ORAL_TABLET | Freq: Two times a day (BID) | ORAL | 11 refills | Status: DC

## 2020-10-06 MED ORDER — ISOSORBIDE MONONITRATE ER 30 MG PO TB24: 30.0000 mg | ORAL_TABLET | Freq: Every day | ORAL | 0 refills | Status: DC

## 2020-10-06 MED ORDER — TORSEMIDE 20 MG PO TABS: 40.0000 mg | ORAL_TABLET | Freq: Every day | ORAL | Status: DC

## 2020-10-06 NOTE — Discharge Instructions (Signed)
Heart Failure, Diagnosis  Heart failure means that your heart is not able to pump blood in the right way. This makes it hard for your body to work well. Heart failure is usually a long-term (chronic) condition. You must take good care of yourself and follow your treatment plan from your doctor. What are the causes?  High blood pressure.  Buildup of cholesterol and fat in the arteries.  Heart attack. This injures the heart muscle.  Heart valves that do not open and close properly.  Damage of the heart muscle. This is also called cardiomyopathy.  Infection of the heart muscle. This is also called myocarditis.  Lung disease. What increases the risk?  Getting older. The risk of heart failure goes up as a person ages.  Being overweight.  Being female.  Use tobacco or nicotine products.  Abusing alcohol or drugs.  Having taken medicines that can damage the heart.  Having any of these conditions: ? Diabetes. ? Abnormal heart rhythms. ? Thyroid problems. ? Low blood counts (anemia).  Having a family history of heart failure. What are the signs or symptoms?  Shortness of breath.  Coughing.  Swelling of the feet, ankles, legs, or belly.  Losing or gaining weight for no reason.  Trouble breathing.  Waking from sleep because of the need to sit up and get more air.  Fast heartbeat.  Being very tired.  Feeling dizzy, or feeling like you may pass out (faint).  Having no desire to eat.  Feeling like you may vomit (nauseous).  Peeing (urinating) more at night.  Feeling confused. How is this treated? This condition may be treated with:  Medicines. These can be given to treat blood pressure and to make the heart muscles stronger.  Changes in your daily life. These may include: ? Eating a healthy diet. ? Staying at a healthy body weight. ? Quitting tobacco, alcohol, and drug use. ? Doing exercises. ? Participating in a cardiac rehabilitation program. This program  helps you improve your health through exercise, education, and counseling.  Surgery. Surgery can be done to open blocked valves, or to put devices in the heart, such as pacemakers.  A donor heart (heart transplant). You will receive a healthy heart from a donor. Follow these instructions at home:  Treat other conditions as told by your doctor. These may include high blood pressure, diabetes, thyroid disease, or abnormal heart rhythms.  Learn as much as you can about heart failure.  Get support as you need it.  Keep all follow-up visits. Summary  Heart failure means that your heart is not able to pump blood in the right way.  This condition is often caused by high blood pressure, heart attack, or damage of the heart muscle.  Symptoms of this condition include shortness of breath and swelling of the feet, ankles, legs, or belly. You may also feel very tired or feel like you may vomit.  You may be treated with medicines, surgery, or changes in your daily life.  Treat other health conditions as told by your doctor. This information is not intended to replace advice given to you by your health care provider. Make sure you discuss any questions you have with your health care provider. Document Revised: 01/23/2020 Document Reviewed: 01/23/2020 Elsevier Patient Education  2021 Elsevier Inc.  

## 2020-10-06 NOTE — TOC Transition Note (Signed)
Transition of Care [facility]) - CM/SW Discharge Note Heart Failure   Patient Details  Name: Dana [name] MRN: [id number] Date of Birth: 1961-02-12  Transition of [facility] CM/SW Contact:  [place], [place] Phone Number: 10/06/2020, 4:05 PM   Clinical Narrative:    CSW requested wheelchair delivery from [employer] and they delivered to patients room before discharge. CSW requested oxygen orders from the patients doctor and contacted [facility] for delivery of portable oxygen and [facility] delivered the portable oxygen to the patients room before patient discharged.  TOC is signing off.   Final next level of care: Home/Self Care Barriers to Discharge: Barriers Resolved   Patient Goals and CMS Choice        Discharge Placement                       Discharge Plan and Services In-house Referral: Clinical Social Work Discharge Planning Services: CM Consult            DME Arranged: Oxygen,Wheelchair manual DME Agency: [employer],[employer] ([place] Patients) Date DME Agency Contacted: 10/06/20 Time DME Agency Contacted: [phone] Representative spoke with at DME Agency: [place]            Social Determinants of Health ([place]) Interventions Food Insecurity Interventions: Intervention Not Indicated Transportation Interventions: Intervention Not Indicated   Readmission Risk Interventions Readmission Risk Prevention Plan 10/06/2020 10/04/2020 09/03/2020  Transportation Screening Complete Complete Complete  PCP or Specialist Appt within 3-5 Days Complete - (No Data)  [place] or [place] - - Complete  Social Work Consult for [place] Planning/Counseling Complete - Complete  Palliative Care Screening Not Applicable Not Applicable Not Applicable  Medication Review Press photographer) Complete Complete Complete  Some recent data might be hidden    Cortlin Polo, MSW, [place] Heart Failure Social Worker

## 2020-10-06 NOTE — Addendum Note (Signed)
Addended by: Scarlette Calico on: 10/06/2020 10:39 AM   Modules accepted: Orders

## 2020-10-06 NOTE — Care Management Important Message (Signed)
Important Message  Patient Details  Name: Dana [name] MRN: [id number] Date of Birth: 1961/06/17   Medicare Important Message Given:  Yes     [name] 10/06/2020, 8:39 AM

## 2020-10-06 NOTE — Progress Notes (Addendum)
Advanced Heart Failure Team Rounding Note   PCP:  [name], MD  PCP-Cardiology: [name], MD     Reason for Admission: Chest Pain, NSTEMI    HPI:    3/17-admitted for NSTEMI received PCI DES to mid circ, balloon angioplasty to OM1 3/20 & 3/21 received 1UPRBCs. -->Hgb 7.8   3/21 received dose of IV lasix. >2.5 urine output.   Creatinine trend 2.2 > 2.5 > 3.9 > 4.9>>5.5>>4.5>>3.5>2.9  Received another dose of 80mg  IV lasix yesterday with good output weight down another 4lbs.  Down 10lbs since admission.    Objective:    Vital Signs:   Temp:  [97.3 F (36.3 C)-98.3 F (36.8 C)] 97.3 F (36.3 C) (03/24 0800) Pulse Rate:  [2-87] 2 (03/24 0800) Resp:  [15-20] 16 (03/24 0800) BP: (123-166)/(49-62) 152/54 (03/24 0800) SpO2:  [95 %-97 %] 97 % (03/24 0552) Weight:  [107.8 kg] 107.8 kg (03/24 0552) Last BM Date: 10/05/20 Filed Weights   10/04/20 0546 10/05/20 0500 10/06/20 0552  Weight: 113.3 kg 109.4 kg 107.8 kg     Physical Exam   Cardiac: JVD 9, normal rate and rhythm, clear s1 and s2, no murmurs, rubs or gallops, bilateral 2+ LE edema Pulmonary: clear, not in distress Abdominal: non distended abdomen, soft and nontender Psych: Alert, conversant, in good spirits   Telemetry  SR 70's-80's  personally reviewed.   Labs     Basic Metabolic Panel: Recent Labs  Lab 09/29/20 1955 09/30/20 0241 10/01/20 2300 10/02/20 0201 10/03/20 0509 10/04/20 0240 10/05/20 0219 10/06/20 0246  NA 135   < >  --  133* 135 138 135 136  K 4.0   < >  --  4.1 4.4 3.6 3.4* 4.0  CL 102   < >  --  98 103 105 100 101  CO2 23   < >  --  19* 21* 23 25 26   GLUCOSE 159*   < >  --  302* 169* 79 137* 149*  BUN 47*   < >  --  76* 98* 92* 84* 74*  CREATININE 1.94*   < >  --  4.88* 5.48* 4.53* 3.47* 2.94*  CALCIUM 8.3*   < >  --  8.7* 8.1* 8.2* 8.1* 8.2*  MG 2.1  --  2.2  --   --   --   --   --    < > = values in this interval not displayed.    Liver Function Tests: Recent  Labs  Lab 10/01/20 2300  AST 16  ALT 16  ALKPHOS 105  BILITOT 0.9  PROT 7.4  ALBUMIN 3.0*   No results for input(s): LIPASE, AMYLASE in the last 168 hours. No results for input(s): AMMONIA in the last 168 hours.  CBC: Recent Labs  Lab 10/03/20 0509 10/03/20 1526 10/04/20 0240 10/05/20 0219 10/06/20 0246  WBC 24.3* 19.8* 17.3* 18.9* 18.0*  NEUTROABS 20.4*  --   --   --   --   HGB 7.1* 6.7* 7.8* 8.0* 8.4*  HCT 21.9* 20.3* 23.7* 24.1* 24.9*  MCV 84.6 83.5 84.3 83.7 83.8  PLT 287 290 298 325 345    Cardiac Enzymes: No results for input(s): CKTOTAL, CKMB, CKMBINDEX, TROPONINI in the last 168 hours.  BNP: BNP (last 3 results) Recent Labs    08/29/20 0636 09/29/20 0724  BNP 87.6 268.7*    ProBNP (last 3 results) No results for input(s): PROBNP in the last 8760 hours.   CBG: Recent  Labs  Lab 10/05/20 0721 10/05/20 1154 10/05/20 1655 10/05/20 2103 10/06/20 0733  GLUCAP 133* 213* 188* 194* 132*    Coagulation Studies: No results for input(s): LABPROT, INR in the last 72 hours.  Imaging: No results found. Medications  . aspirin  81 mg Oral Daily  . carvedilol  3.125 mg Oral BID WC  . clopidogrel  75 mg Oral Daily  . insulin aspart  0-15 Units Subcutaneous TID WC  . insulin aspart  0-5 Units Subcutaneous QHS  . insulin detemir  10 Units Subcutaneous Daily  . sodium chloride flush  3 mL Intravenous Q12H  . sodium chloride flush  3 mL Intravenous Q12H    Assessment/Plan   1. NSTEMI - Known coronary disease with stent mid LAD and Prox RCA. Has been on repatha/plavix  - had NSTEMI in February. Cath deferred due to AKI. C - Developed chest pain  EKG changes noted ST depression V4/V5. HS 60>239 compatible with NSTEMI -3/17 received PCI DES to mid circ, balloon angioplasty to OM1  -No chest pain - Continue  DAPT w/ ASA + Plavix   2. AKI on CKD Stage IV - Recent AKI with up 3.4 during hospitalization in February.  - Creatinine trending down. Peak  5.5>>4.5>>3.5>2.5 Likely due to CIN - Nephrology has signed off due to improvement  3. Acute on chronic Diastolic Heart Failure- Echo 08/2020 EF 55-60%  - Received IVF for gastroparesis (now off) -Currently no indication for HD.  -volume status improved significantly. Will transition back to oral diuretic and discharge today. -start back antihypertensives at lower dose on d/c likely a component of her hypertension is volume related.  She will need to check bp at home.    4. N/V due to severe gastroparesis -now resolved  5. OSA Continue CPAP nightly.   6. Epistaxis.  - continue PRN Afrin - Can try topical TXA. Discussed dosing with PharmD personally. - Resolved.   7. DM2 - Better controlled.  - continue SSI  8. Chronic Anemia - Baseline Hgb 8-9 range, likely 2/2 renal disease  - exacerbated by Epistaxis. 3/20&3/21 received blood.  - 6.7>7.8> 8.0 - monitor for further bleeding - transfuse for Hgb < 7.0   11. Leukocytosis - WBK 17-18K but AF.  - ? Reactive from NSTEMI  - UA/UC not consistent with infection   - Blood cultures NGTD - needs repeat cbc outpatient  Katherine Roan, MD 10/06/2020, 9:48 AM  Advanced Heart Failure Team Pager [phone] (M-F; 7a - 5p)  Please contact [facility] for night-coverage after hours (4p -7a ) and weekends on [email or web address]  Patient seen and examined with the above-signed Advanced Practice Provider and/or Housestaff. I personally reviewed laboratory data, imaging studies and relevant notes. I independently examined the patient and formulated the important aspects of the plan. I have edited the note to reflect any of my changes or salient points. I have personally discussed the plan with the patient and/or family.  She looks much better. Walking halls. NO SOB or CP. Volume status improved. Creatinine continues to improve.  General:  Well appearing. No resp difficulty HEENT: normal Neck: supple. no JVD. Carotids 2+ bilat; no bruits. No  lymphadenopathy or thryomegaly appreciated. Cor: PMI nondisplaced. Regular rate & rhythm. No rubs, gallops or murmurs. Lungs: clear Abdomen: obese soft, nontender, nondistended. No hepatosplenomegaly. No bruits or masses. Good bowel sounds. Extremities: no cyanosis, clubbing, rash, tr edema Neuro: alert & orientedx3, cranial nerves grossly intact. moves all 4 extremities w/o difficulty. Affect pleasant  She is stable for d/c today. Resume home diuretic regimen. Will follow closely in Clinic.   [name], MD  2:02 PM

## 2020-10-06 NOTE — Discharge Summary (Signed)
Advanced Heart Failure Discharge Note  Discharge Summary   Patient ID: Dana [name] MRN: [id number], DOB/AGE: Jan 15, 1961 60 y.o. Admit date: 09/29/2020 D/C date:     10/06/2020   Primary Discharge Diagnoses:  NSTEMI s/p PCI to mid circ, balloon angioplasty to OM1 Contrast induced nephropathy AKI on CKD  Acute on Chronic Diastolic CHF Acute blood loss anemia Gastroparesis Epistaxis Chronic normocytic anemia T2DM Leukocytosis  Hospital Course:  Dana [name] is 60 year old with history of diastolic heart failure, CAD, DES RCA/LAD 2018, DMI, htn, hyperlipidemia, PAD, and OSA  Admitted 08/29/20 with chest pain and increased shortness of breath. Had CTA that negative for PE. HS Trop 41>66. This was initially thought to be from demand ischemia. Had chest pain again with HS Trop up to  1917. Placed on heparin drip + Ntg drip. Also diuresed with IV lasix. Cath was not pursued with creatinine 3.4. Hospital course complicated by AKI and anemia so cath was not pursued.  09/29/20 Patient woke up Dana the middle the night with sharp chest pain and shortness of breath with exertion.  Dana [name] presented with ST depressions in V4 and V5 and high-sensitivity troponin that increased from 60>239 and a chest x-ray consistent with pulmonary edema.  Additionally Dana [name] had a leukocytosis on admission which was felt to be reactive in the setting of NSTEMI.   Dana [name] was started on nitro and heparin drip with improvement in symptoms.  Dana [name] for left heart cath:  1. 3 vessel CAD with moderate mid-LAD and mid-RCA stenoses, and severe mid-LCx stenosis 2. Successful PCI of the mid-circumflex, reducing severe 90% stenosis to 0% with a 2.75x22 mm Resolute Onyx DES, provisional side branch angioplasty of the OM1 with a 2.5 mm balloon through the stent struts 3. Moderately elevated LVEDP  Recommend: at least 12 motnhs DAPT with ASA and plavix, favor long-term if tolerated  Total contrast = 75 cc  Dana Dana left heart  [name] with PCI Dana chest pain symptoms resolved completely.  Unfortunately Dana [name] developed a contrast-induced nephropathy and Dana creatinine went from 2.2 up to a peak of 5.5.  Dana [name] volume overloaded at this [name].  Additionally, Dana [name] developed severe nausea and vomiting, gastroparesis and required IV antiplatelet therapy.  While on this therapy Dana [name] developed epistaxis and acute blood loss anemia requiring transfusion.  Dana [name] was given promotility agents given Dana history of gastroparesis Dana nausea and vomiting improved.  Epistaxis was treated with nasal Afrin.  Dana [name] was switched back to oral DAPT. Dana [name] diuretics were reintroduced and Dana creatinine began to improve over the following days.  5.5>>4.5>>3.5>2.9.  Dana [name] improved significantly, Dana [name] no longer had bleeding, [name] chest pain free and had no further nausea and vomiting.  Dana leukocytosis persisted but all Dana infectious workup was negative and Dana [name] was afebrile throughout admission and this was felt to be reactive.  Dana [name] was discharged with a lab follow-up appointment and follow-up in our clinic also has a follow up with nephrology on 10/13/20.    Discharge Weight Range: Initial weight 248lbs, discharge weight 237lbs Discharge Vitals: Blood pressure (!) 152/54, pulse (!) 2, temperature (!) 97.3 F (36.3 C), temperature source Oral, resp. rate 16, height 5\' 5"  (1.651 m), weight 107.8 kg, SpO2 97 %.  Labs: Lab Results  Component Value Date   WBC 18.0 (H) 10/06/2020   HGB 8.4 (L) 10/06/2020   HCT 24.9 (L) 10/06/2020   MCV 83.8 10/06/2020   PLT 345 10/06/2020  Recent Labs  Lab 10/01/20 2300 10/02/20 0201 10/06/20 0246  NA  --    < > 136  K  --    < > 4.0  CL  --    < > 101  CO2  --    < > 26  BUN  --    < > 74*  CREATININE  --    < > 2.94*  CALCIUM  --    < > 8.2*  PROT 7.4  --   --   BILITOT 0.9  --   --   ALKPHOS 105  --   --   ALT 16  --   --   AST 16  --   --   GLUCOSE  --    < > 149*    < > = values in this interval not displayed.   Lab Results  Component Value Date   CHOL 101 09/30/2020   HDL 42 09/30/2020   LDLCALC 44 09/30/2020   TRIG 77 09/30/2020   BNP (last 3 results) Recent Labs    08/29/20 0636 09/29/20 0724  BNP 87.6 268.7*    ProBNP (last 3 results) No results for input(s): PROBNP in the last 8760 hours.   Diagnostic Studies/Procedures   Dominance: Right    Intervention      Discharge Medications   Allergies as of 10/06/2020      Reactions   Naproxen Hives, Rash   Celecoxib Other (See Comments)   Stomach pain   Aspirin Other (See Comments)   CAN NOT TAKE DUE TO ULCERS (documented previously) but takes every day currently (as of 09/29/20) without reported issues   Glucosamine Hives, Swelling, Other (See Comments)   Angioedema   Metoclopramide Other (See Comments)   Facial twitching and stuttering   Metolazone Other (See Comments)   Acute renal failure   Shellfish-derived Products Hives, Swelling, Other (See Comments)   Angioedema   Tramadol Nausea And Vomiting         Medication List    STOP taking these medications   dapagliflozin propanediol 10 MG Tabs tablet Commonly known as: FARXIGA     TAKE these medications   allopurinol 100 MG tablet Commonly known as: ZYLOPRIM Take 1 tablet (100 mg total) by mouth daily.   aspirin 81 MG chewable tablet Chew 81 mg by mouth daily.   carvedilol 12.5 MG tablet Commonly known as: COREG Take 0.5 tablets (6.25 mg total) by mouth 2 (two) times daily. What changed: how much to take   clopidogrel 75 MG tablet Commonly known as: PLAVIX Take 1 tablet (75 mg total) by mouth at bedtime.   EPINEPHrine 0.3 mg/0.3 mL Soaj injection Commonly known as: EPI-PEN Use as directed for life-threatening allergic reaction. What changed:   how much to take  how to take this  when to take this  reasons to take this   ezetimibe 10 MG tablet Commonly known as: ZETIA TAKE 1 TABLET BY MOUTH   DAILY   famotidine 20 MG tablet Commonly known as: PEPCID Take 1 tablet (20 mg total) by mouth daily.   ferrous sulfate 325 (65 FE) MG tablet Take 325 mg by mouth 2 (two) times daily with a meal.   FISH OIL PO Take 1 capsule by mouth daily.   hydrALAZINE 25 MG tablet Commonly known as: APRESOLINE Take 1 tablet (25 mg total) by mouth 3 (three) times daily. What changed:   medication strength  how much to take   HYDROcodone-acetaminophen 7.5-325  MG tablet Commonly known as: NORCO Take 1 tablet by mouth 3 (three) times daily as needed for moderate pain.   insulin aspart 100 UNIT/ML injection Commonly known as: novoLOG Inject 2-10 Units into the skin 3 (three) times daily before meals.   isosorbide mononitrate 30 MG 24 hr tablet Commonly known as: IMDUR Take 1 tablet (30 mg total) by mouth daily. What changed:   medication strength  how much to take   latanoprost 0.005 % ophthalmic solution Commonly known as: XALATAN Place 1 drop into both eyes at bedtime.   Levemir FlexTouch 100 UNIT/ML FlexPen Generic drug: insulin detemir Inject 0-50 Units into the skin daily.   LORazepam 0.5 MG tablet Commonly known as: ATIVAN Take 1 tablet (0.5 mg total) by mouth daily as needed for anxiety.   magnesium oxide 400 MG tablet Commonly known as: MAG-OX Take 2 tablets (800 mg total) by mouth daily. Take 2 (400 mg) daily=800 mg What changed:   how much to take  additional instructions   nitroGLYCERIN 0.4 MG SL tablet Commonly known as: NITROSTAT Place 1 tablet (0.4 mg total) under the tongue every 5 (five) minutes as needed for chest pain.   OXYGEN Inhale 2 L into the lungs at bedtime.   Probiotic Caps Take 1 capsule by mouth daily.   promethazine 25 MG tablet Commonly known as: PHENERGAN Take 1 tablet (25 mg total) by mouth every 6 (six) hours as needed for nausea or vomiting.   Repatha SureClick 017 MG/ML Soaj Generic drug: Evolocumab Inject 1 Dose into the  skin every 14 (fourteen) days.   torsemide 20 MG tablet Commonly known as: DEMADEX Take 2 tablets (40 mg total) by mouth daily. Start taking on: October 07, 2020            Durable Medical Equipment  (From admission, onward)         Start     Ordered   10/06/20 1330  DME standard manual wheelchair with seat cushion  Once       Comments: Patient suffers from Diastolic CHF, Coronary Artery Diseaes which impairs their ability to perform daily activities like bathing, dressing, and grooming in the home.  A walker will not resolve issue with performing activities of daily living. A wheelchair will allow patient to safely perform daily activities. Patient can safely propel the wheelchair in the home or has a caregiver who can provide assistance. Length of need 12 months . Accessories: elevating leg rests (ELRs), wheel locks, extensions and anti-tippers.   10/06/20 1332          Disposition   The patient will be discharged in stable condition to home. Discharge Instructions    Amb Referral to Cardiac Rehabilitation   Complete by: As directed    Referring to [facility] 2   Diagnosis:  NSTEMI Coronary Stents     Dana initial evaluation and assessments completed: Virtual Based Care may be provided alone or in conjunction with Phase 2 Cardiac Rehab based on patient barriers.: Yes   Diet - low sodium heart healthy   Complete by: As directed    Discharge instructions   Complete by: As directed    Dana. Silba your renal function continues to improve.  You can begin taking your torsemide again starting tomorrow.  Please weight yourself daily.  I would like you to take your blood pressure at home and bring a log of it with you on your next visit.  1. Follow a low-salt diet - you  are allowed no more than 2,000mg  of sodium per day. Watch your fluid intake. In general, you should not be taking in more than 2 liters of fluid per day (no more than 8 glasses per day). This includes sources of  water in foods like soup, coffee, tea, milk, etc. 2. Weigh yourself on the same scale at same [name] of day and keep a log. 3. Call your doctor: (Anytime you feel any of the following symptoms)  - 3lb weight gain overnight or 5lb within a few days - Shortness of breath, with or without a dry hacking cough  - Swelling in the hands, feet or stomach  - If you have to sleep on extra pillows at night in order to breathe   Increase activity slowly   Complete by: As directed       Follow-up Information    [facility].   Specialty: Cardiology Why: Follow up lab visit only scheduled for 10/13/20 at 9:15am Contact information: [address] [id number] [place]       Bensimhon, Shaune Pascal, MD. Go on 10/25/2020.   Specialty: Cardiology Why: 9:30 am appointment [name] Contact information: [place] [address] [phone]                 Duration of Discharge Encounter: Greater than 35 minutes   Signed, Katherine Roan, MD 10/06/2020, 1:57 PM

## 2020-10-06 NOTE — Progress Notes (Signed)
PROGRESS NOTE    Dana [name]  XBD:[id number] DOB: June 24, 1961 DOA: 09/29/2020 PCP: [name], MD     Brief Narrative:  Dana [name] is a 60 year old female with history of type 2 diabetes mellitus with gastroparesis and peripheral neuropathy, morbid obesity, BMI of 41, stage IIIb chronic kidney disease, CAD who was admitted with NSTEMI. She was recently hospitalized last month for same and treated medically at the time, on 3/17 underwent PCI-DESto mid circumflex and balloon angioplasty to OM1. Subsequently developed severe nausea and vomiting and worsening renal failure. TRH consulted for medical management.   New events last 24 hours / Subjective: Patient feeling well, states that she did not sleep at all last night due to anxiety.  Her daughter's wedding is on Saturday.  She is eager to go home.  She denies any further episodes of nausea, vomiting.  She is tolerating diet.  No further episodes of epistaxis over the past several days.  Assessment & Plan:   Active Problems:   Chronic pain syndrome   COPD (chronic obstructive pulmonary disease) (HCC)   Diabetic polyneuropathy associated with type 2 diabetes mellitus (HCC)   Chest pain   ACS (acute coronary syndrome) ([place])   NSTEMI -Per primary -Continue aspiring, plavix, coreg  Gastroparesis  -Now off erythromycin  -Resolved, tolerating diet without any issues  AKI on CKD 4 -Secondary to contrast-induced nephropathy, ATN -Nephrology now signed off -Improving  Severe epistaxis Acute blood loss anemia -Status post 2 unit packed red blood cell transfusion -Epistaxis resolved   Acute on chronic diastolic CHF -Volume status improving, received lasix yesterday   Type 2 diabetes mellitus, well controlled -Hemoglobin A1c 6.8 -Levemir, SSI   Obesity Estimated body mass index is 39.54 kg/m as calculated from the following:   Height as of this encounter: 5\' 5"  (1.651 m).   Weight as of this encounter: 107.8  kg.  OSA -Continue CPAP nightly  Leukocytosis -?  Reactive.  No signs of infectious process at this time.  Blood cultures negative.  Remains afebrile.     Patient remains medically stable from my standpoint for discharge home today.  Would recommend repeat CBC and BMP in 1 week as an outpatient to check on continued improvement of leukocytosis and creatinine.    Antimicrobials:  Anti-infectives (From admission, onward)   Start     Dose/Rate Route Frequency Ordered Stop   10/01/20 1830  erythromycin 250 mg in sodium chloride 0.9 % 100 mL IVPB  Status:  Discontinued        250 mg 100 mL/hr over 60 Minutes Intravenous Every 8 hours 10/01/20 1724 10/04/20 0827       Objective: Vitals:   10/05/20 1711 10/05/20 1949 10/06/20 0552 10/06/20 0800  BP: (!) 166/62 (!) 139/49 (!) 123/50 (!) 152/54  Pulse: 75 87 83 (!) 2  Resp: 20 18 15 16   Temp: 97.9 F (36.6 C) 97.6 F (36.4 C) 98.3 F (36.8 C) (!) 97.3 F (36.3 C)  TempSrc: Oral Oral Oral Oral  SpO2: 96%  97%   Weight:   107.8 kg   Height:        Intake/Output Summary (Last 24 hours) at 10/06/2020 1030 Last data filed at 10/06/2020 0800 Gross per 24 hour  Intake 480 ml  Output 2300 ml  Net -1820 ml   Filed Weights   10/04/20 0546 10/05/20 0500 10/06/20 0552  Weight: 113.3 kg 109.4 kg 107.8 kg    Examination: General exam: Appears calm and comfortable  Respiratory  system: Clear to auscultation. Respiratory effort normal.  On room air Cardiovascular system: S1 & S2 heard, RRR. Gastrointestinal system: Abdomen is nondistended, soft and nontender. Normal bowel sounds heard. Central nervous system: Alert and oriented. Non focal exam. Speech clear  Extremities: Symmetric in appearance bilaterally  Skin: No rashes, lesions or ulcers on exposed skin  Psychiatry: Judgement and insight appear stable. Mood & affect appropriate.    Data Reviewed: I have personally reviewed following labs and imaging studies  CBC: Recent  Labs  Lab 10/03/20 0509 10/03/20 1526 10/04/20 0240 10/05/20 0219 10/06/20 0246  WBC 24.3* 19.8* 17.3* 18.9* 18.0*  NEUTROABS 20.4*  --   --   --   --   HGB 7.1* 6.7* 7.8* 8.0* 8.4*  HCT 21.9* 20.3* 23.7* 24.1* 24.9*  MCV 84.6 83.5 84.3 83.7 83.8  PLT 287 290 298 325 601   Basic Metabolic Panel: Recent Labs  Lab 09/29/20 1955 09/30/20 0241 10/01/20 2300 10/02/20 0201 10/03/20 0509 10/04/20 0240 10/05/20 0219 10/06/20 0246  NA 135   < >  --  133* 135 138 135 136  K 4.0   < >  --  4.1 4.4 3.6 3.4* 4.0  CL 102   < >  --  98 103 105 100 101  CO2 23   < >  --  19* 21* 23 25 26   GLUCOSE 159*   < >  --  302* 169* 79 137* 149*  BUN 47*   < >  --  76* 98* 92* 84* 74*  CREATININE 1.94*   < >  --  4.88* 5.48* 4.53* 3.47* 2.94*  CALCIUM 8.3*   < >  --  8.7* 8.1* 8.2* 8.1* 8.2*  MG 2.1  --  2.2  --   --   --   --   --    < > = values in this interval not displayed.   GFR: Estimated Creatinine Clearance: 25.8 mL/min (A) (by C-G formula based on SCr of 2.94 mg/dL (H)). Liver Function Tests: Recent Labs  Lab 10/01/20 2300  AST 16  ALT 16  ALKPHOS 105  BILITOT 0.9  PROT 7.4  ALBUMIN 3.0*   No results for input(s): LIPASE, AMYLASE in the last 168 hours. No results for input(s): AMMONIA in the last 168 hours. Coagulation Profile: No results for input(s): INR, PROTIME in the last 168 hours. Cardiac Enzymes: No results for input(s): CKTOTAL, CKMB, CKMBINDEX, TROPONINI in the last 168 hours. BNP (last 3 results) No results for input(s): PROBNP in the last 8760 hours. HbA1C: No results for input(s): HGBA1C in the last 72 hours. CBG: Recent Labs  Lab 10/05/20 0721 10/05/20 1154 10/05/20 1655 10/05/20 2103 10/06/20 0733  GLUCAP 133* 213* 188* 194* 132*   Lipid Profile: No results for input(s): CHOL, HDL, LDLCALC, TRIG, CHOLHDL, LDLDIRECT in the last 72 hours. Thyroid Function Tests: No results for input(s): TSH, T4TOTAL, FREET4, T3FREE, THYROIDAB in the last 72  hours. Anemia Panel: No results for input(s): VITAMINB12, FOLATE, FERRITIN, TIBC, IRON, RETICCTPCT in the last 72 hours. Sepsis Labs: Recent Labs  Lab 09/29/20 1907  LATICACIDVEN 1.5    Recent Results (from the past 240 hour(s))  Resp Panel by RT-PCR (Flu A&B, Covid) Nasopharyngeal Swab     Status: None   Collection Time: 09/29/20  9:20 AM   Specimen: Nasopharyngeal Swab; Nasopharyngeal(NP) swabs in vial transport medium  Result Value Ref Range Status   SARS Coronavirus 2 by RT PCR NEGATIVE NEGATIVE Final  Comment: (NOTE) SARS-CoV-2 target nucleic acids are NOT DETECTED.  The SARS-CoV-2 RNA is generally detectable in upper respiratory specimens during the acute phase of infection. The lowest concentration of SARS-CoV-2 viral copies this assay can detect is 138 copies/mL. A negative result does not preclude SARS-Cov-2 infection and should not be used as the sole basis for treatment or other patient management decisions. A negative result may occur with  improper specimen collection/handling, submission of specimen other than nasopharyngeal swab, presence of viral mutation(s) within the areas targeted by this assay, and inadequate number of viral copies(<138 copies/mL). A negative result must be combined with clinical observations, patient history, and epidemiological information. The expected result is Negative.  Fact Sheet for Patients:  EntrepreneurPulse.com.au  Fact Sheet for Healthcare Providers:  IncredibleEmployment.be  This test is no t yet approved or cleared by the [place] FDA and  has been authorized for detection and/or diagnosis of SARS-CoV-2 by FDA under an Emergency Use Authorization (EUA). This EUA will remain  in effect (meaning this test can be used) for the duration of the COVID-19 declaration under Section 564(b)(1) of the Act, 21 U.S.C.section 360bbb-3(b)(1), unless the authorization is terminated  or revoked  sooner.       Influenza A by PCR NEGATIVE NEGATIVE Final   Influenza B by PCR NEGATIVE NEGATIVE Final    Comment: (NOTE) The Xpert Xpress SARS-CoV-2/FLU/RSV plus assay is intended as an aid in the diagnosis of influenza from Nasopharyngeal swab specimens and should not be used as a sole basis for treatment. Nasal washings and aspirates are unacceptable for Xpert Xpress SARS-CoV-2/FLU/RSV testing.  Fact Sheet for Patients: EntrepreneurPulse.com.au  Fact Sheet for Healthcare Providers: IncredibleEmployment.be  This test is not yet approved or cleared by the [place] FDA and has been authorized for detection and/or diagnosis of SARS-CoV-2 by FDA under an Emergency Use Authorization (EUA). This EUA will remain in effect (meaning this test can be used) for the duration of the COVID-19 declaration under Section 564(b)(1) of the Act, 21 U.S.C. section 360bbb-3(b)(1), unless the authorization is terminated or revoked.  Performed at [facility], [place] [address] [place], [place] [address]   Culture, blood (Routine X 2) w Reflex to ID Panel     Status: None   Collection Time: 09/29/20  7:55 PM   Specimen: BLOOD RIGHT HAND  Result Value Ref Range Status   Specimen Description BLOOD RIGHT HAND  Final   Special Requests   Final    BOTTLES DRAWN AEROBIC AND ANAEROBIC Blood Culture adequate volume   Culture   Final    NO GROWTH 5 DAYS Performed at [facility], [place] [address] [place], [place] [address]    Report Status 10/05/2020 FINAL  Final  Culture, blood (Routine X 2) w Reflex to ID Panel     Status: None   Collection Time: 09/29/20  7:55 PM   Specimen: BLOOD LEFT HAND  Result Value Ref Range Status   Specimen Description BLOOD LEFT HAND  Final   Special Requests   Final    BOTTLES DRAWN AEROBIC AND ANAEROBIC Blood Culture adequate volume   Culture   Final    NO GROWTH 5 DAYS Performed at [facility], [place] [address], [place], [place] [address]    Report Status 10/05/2020 FINAL  Final  Culture, Urine     Status: None   Collection Time: 10/03/20 11:15 AM   Specimen: Urine, Random  Result Value Ref Range Status  Specimen Description URINE, RANDOM  Final   Special Requests NONE  Final   Culture   Final    NO GROWTH Performed at [facility], [place] [address], [place], [place] [address]    Report Status 10/04/2020 FINAL  Final  Culture, blood (routine x 2)     Status: None (Preliminary result)   Collection Time: 10/03/20 11:41 AM   Specimen: BLOOD  Result Value Ref Range Status   Specimen Description BLOOD RIGHT ANTECUBITAL  Final   Special Requests   Final    BOTTLES DRAWN AEROBIC AND ANAEROBIC Blood Culture results may not be optimal due to an inadequate volume of blood received in culture bottles   Culture   Final    NO GROWTH 3 DAYS Performed at [facility], [place] [address], [place], [place] [address]    Report Status PENDING  Incomplete  Culture, blood (routine x 2)     Status: None (Preliminary result)   Collection Time: 10/03/20 11:41 AM   Specimen: BLOOD LEFT HAND  Result Value Ref Range Status   Specimen Description BLOOD LEFT HAND  Final   Special Requests   Final    BOTTLES DRAWN AEROBIC AND ANAEROBIC Blood Culture results may not be optimal due to an inadequate volume of blood received in culture bottles   Culture   Final    NO GROWTH 3 DAYS Performed at [facility], [place] [address] [place],  [address]    Report Status PENDING  Incomplete      Radiology Studies: No results found.    Scheduled Meds: . aspirin  81 mg Oral Daily  . carvedilol  3.125 mg Oral BID WC  . clopidogrel  75 mg Oral Daily  . insulin aspart  0-15 Units Subcutaneous TID WC  . insulin aspart  0-5 Units Subcutaneous QHS  . insulin detemir  10 Units Subcutaneous Daily  . sodium chloride flush  3 mL Intravenous Q12H  . sodium chloride flush  3 mL Intravenous Q12H   Continuous  Infusions: . sodium chloride       LOS: 7 days      Time spent: 20 minutes   Dessa Phi, DO [facility] 10/06/2020, 10:30 AM   Available via Epic secure chat 7am-7pm After these hours, please refer to coverage provider listed on [email or web address]

## 2020-10-07 ENCOUNTER — Telehealth (HOSPITAL_COMMUNITY): Payer: Self-pay

## 2020-10-07 NOTE — Telephone Encounter (Signed)
Cardiac rehab referral for Ph.II faxed to [facility]. 

## 2020-10-08 LAB — CULTURE, BLOOD (ROUTINE X 2)
Culture: NO GROWTH
Culture: NO GROWTH

## 2020-10-11 ENCOUNTER — Telehealth (HOSPITAL_COMMUNITY): Payer: Self-pay | Admitting: Internal Medicine

## 2020-10-11 NOTE — Telephone Encounter (Signed)
Pt request lab orders be sent to [facility] in [place], [place]. Please advise

## 2020-10-11 NOTE — Telephone Encounter (Signed)
Order faxed.

## 2020-10-12 ENCOUNTER — Other Ambulatory Visit: Payer: HMO

## 2020-10-12 ENCOUNTER — Other Ambulatory Visit: Payer: Self-pay

## 2020-10-12 DIAGNOSIS — D649 Anemia, unspecified: Secondary | ICD-10-CM

## 2020-10-12 DIAGNOSIS — N184 Chronic kidney disease, stage 4 (severe): Secondary | ICD-10-CM

## 2020-10-12 DIAGNOSIS — I5032 Chronic diastolic (congestive) heart failure: Secondary | ICD-10-CM

## 2020-10-13 ENCOUNTER — Other Ambulatory Visit (HOSPITAL_COMMUNITY): Payer: HMO

## 2020-10-13 DIAGNOSIS — R809 Proteinuria, unspecified: Secondary | ICD-10-CM | POA: Insufficient documentation

## 2020-10-13 DIAGNOSIS — Z87891 Personal history of nicotine dependence: Secondary | ICD-10-CM | POA: Diagnosis not present

## 2020-10-13 DIAGNOSIS — E1042 Type 1 diabetes mellitus with diabetic polyneuropathy: Secondary | ICD-10-CM | POA: Diagnosis not present

## 2020-10-13 DIAGNOSIS — E1022 Type 1 diabetes mellitus with diabetic chronic kidney disease: Secondary | ICD-10-CM | POA: Insufficient documentation

## 2020-10-13 DIAGNOSIS — Z794 Long term (current) use of insulin: Secondary | ICD-10-CM | POA: Diagnosis not present

## 2020-10-13 DIAGNOSIS — I130 Hypertensive heart and chronic kidney disease with heart failure and stage 1 through stage 4 chronic kidney disease, or unspecified chronic kidney disease: Secondary | ICD-10-CM | POA: Diagnosis not present

## 2020-10-13 DIAGNOSIS — N179 Acute kidney failure, unspecified: Secondary | ICD-10-CM | POA: Diagnosis not present

## 2020-10-13 DIAGNOSIS — D500 Iron deficiency anemia secondary to blood loss (chronic): Secondary | ICD-10-CM | POA: Diagnosis not present

## 2020-10-13 DIAGNOSIS — N184 Chronic kidney disease, stage 4 (severe): Secondary | ICD-10-CM | POA: Insufficient documentation

## 2020-10-13 DIAGNOSIS — E1021 Type 1 diabetes mellitus with diabetic nephropathy: Secondary | ICD-10-CM | POA: Insufficient documentation

## 2020-10-13 DIAGNOSIS — E1065 Type 1 diabetes mellitus with hyperglycemia: Secondary | ICD-10-CM | POA: Insufficient documentation

## 2020-10-13 DIAGNOSIS — I5032 Chronic diastolic (congestive) heart failure: Secondary | ICD-10-CM | POA: Diagnosis not present

## 2020-10-13 DIAGNOSIS — N1832 Chronic kidney disease, stage 3b: Secondary | ICD-10-CM | POA: Insufficient documentation

## 2020-10-13 DIAGNOSIS — I25119 Atherosclerotic heart disease of native coronary artery with unspecified angina pectoris: Secondary | ICD-10-CM | POA: Diagnosis not present

## 2020-10-13 LAB — COMPREHENSIVE METABOLIC PANEL
ALT: 10 IU/L (ref 0–32)
AST: 14 IU/L (ref 0–40)
Albumin/Globulin Ratio: 1.2 (ref 1.2–2.2)
Albumin: 3.7 g/dL — ABNORMAL LOW (ref 3.8–4.9)
Alkaline Phosphatase: 114 IU/L (ref 44–121)
BUN/Creatinine Ratio: 23 (ref 9–23)
BUN: 41 mg/dL — ABNORMAL HIGH (ref 6–24)
Bilirubin Total: 0.5 mg/dL (ref 0.0–1.2)
CO2: 19 mmol/L — ABNORMAL LOW (ref 20–29)
Calcium: 9.1 mg/dL (ref 8.7–10.2)
Chloride: 99 mmol/L (ref 96–106)
Creatinine, Ser: 1.81 mg/dL — ABNORMAL HIGH (ref 0.57–1.00)
Globulin, Total: 3.1 g/dL (ref 1.5–4.5)
Glucose: 150 mg/dL — ABNORMAL HIGH (ref 65–99)
Potassium: 4.2 mmol/L (ref 3.5–5.2)
Sodium: 139 mmol/L (ref 134–144)
Total Protein: 6.8 g/dL (ref 6.0–8.5)
eGFR: 32 mL/min/{1.73_m2} — ABNORMAL LOW (ref >59)

## 2020-10-13 LAB — CBC WITH DIFFERENTIAL/PLATELET
Basophils Absolute: 0.1 10*3/uL (ref 0.0–0.2)
Basos: 1 %
EOS (ABSOLUTE): 0.2 10*3/uL (ref 0.0–0.4)
Eos: 2 %
Hematocrit: 27.2 % — ABNORMAL LOW (ref 34.0–46.6)
Hemoglobin: 8.8 g/dL — ABNORMAL LOW (ref 11.1–15.9)
Immature Grans (Abs): 0 10*3/uL (ref 0.0–0.1)
Immature Granulocytes: 0 %
Lymphocytes Absolute: 1.6 10*3/uL (ref 0.7–3.1)
Lymphs: 12 %
MCH: 27.8 pg (ref 26.6–33.0)
MCHC: 32.4 g/dL (ref 31.5–35.7)
MCV: 86 fL (ref 79–97)
Monocytes Absolute: 1 10*3/uL — ABNORMAL HIGH (ref 0.1–0.9)
Monocytes: 8 %
Neutrophils Absolute: 10.4 10*3/uL — ABNORMAL HIGH (ref 1.4–7.0)
Neutrophils: 77 %
Platelets: 389 10*3/uL (ref 150–450)
RBC: 3.17 x10E6/uL — ABNORMAL LOW (ref 3.77–5.28)
RDW: 15.7 % — ABNORMAL HIGH (ref 11.7–15.4)
WBC: 13.3 10*3/uL — ABNORMAL HIGH (ref 3.4–10.8)

## 2020-10-13 LAB — PRO B NATRIURETIC PEPTIDE: NT-Pro BNP: 3407 pg/mL — ABNORMAL HIGH (ref 0–287)

## 2020-10-14 ENCOUNTER — Ambulatory Visit: Payer: HMO | Admitting: Internal Medicine

## 2020-10-16 NOTE — Progress Notes (Signed)
Cancelled. kc 

## 2020-10-17 ENCOUNTER — Other Ambulatory Visit: Payer: Self-pay

## 2020-10-17 ENCOUNTER — Ambulatory Visit (INDEPENDENT_AMBULATORY_CARE_PROVIDER_SITE_OTHER): Payer: HMO | Admitting: Family Medicine

## 2020-10-17 ENCOUNTER — Telehealth (HOSPITAL_COMMUNITY): Payer: Self-pay | Admitting: Adult Health

## 2020-10-17 ENCOUNTER — Encounter: Payer: Self-pay | Admitting: Family Medicine

## 2020-10-17 VITALS — BP 136/60 | HR 72 | Temp 97.5°F | Resp 18 | Ht 65.0 in | Wt 243.0 lb

## 2020-10-17 DIAGNOSIS — I129 Hypertensive chronic kidney disease with stage 1 through stage 4 chronic kidney disease, or unspecified chronic kidney disease: Secondary | ICD-10-CM

## 2020-10-17 DIAGNOSIS — D631 Anemia in chronic kidney disease: Secondary | ICD-10-CM | POA: Diagnosis not present

## 2020-10-17 DIAGNOSIS — N184 Chronic kidney disease, stage 4 (severe): Secondary | ICD-10-CM

## 2020-10-17 DIAGNOSIS — E782 Mixed hyperlipidemia: Secondary | ICD-10-CM | POA: Diagnosis not present

## 2020-10-17 DIAGNOSIS — E1142 Type 2 diabetes mellitus with diabetic polyneuropathy: Secondary | ICD-10-CM

## 2020-10-17 DIAGNOSIS — I214 Non-ST elevation (NSTEMI) myocardial infarction: Secondary | ICD-10-CM | POA: Diagnosis not present

## 2020-10-17 DIAGNOSIS — I130 Hypertensive heart and chronic kidney disease with heart failure and stage 1 through stage 4 chronic kidney disease, or unspecified chronic kidney disease: Secondary | ICD-10-CM

## 2020-10-17 DIAGNOSIS — R202 Paresthesia of skin: Secondary | ICD-10-CM

## 2020-10-17 NOTE — Progress Notes (Signed)
Subjective:  Patient ID: Dana [name], female    DOB: 06-02-1961  Age: 60 y.o. MRN: [id number]  Chief Complaint  Patient presents with  . Coronary Artery Disease    HPI  Patient presents for hospital follow-up.  She was admitted from March 17 to October 06, 2020 at [facility].  Patient had a non-STEMI s/p PCI to mid circumflex, balloon angioplasty to OM1.  She developed contrast-induced nephropathy and needs to have this rechecked today.  In addition she had acute on chronic diastolic congestive heart failure.  This has improved.  Patient sees Dr. Debara Pickett and Dr. Kennon Rounds. Dr. Burt Knack performed PTCA and stent placed. Has had 2 AMI in the last 2 month.  In February left heart cath was postponed due to worsening creatinine.  She returned in March with an acute MI and cath was performed as stated above.  Patient's Wilder Glade was discontinued.patient was diuresed during hospitalization.  Her torsemide was increased to 80 mg daily.  She was referred to cardiac rehab.  She followed up with cardiology on March 31.  And is scheduled to follow-up with Dr. Sung Amabile on April 12.  Current Outpatient Medications on File Prior to Visit  Medication Sig Dispense Refill  . allopurinol (ZYLOPRIM) 100 MG tablet Take 1 tablet (100 mg total) by mouth daily. 90 tablet 0  . aspirin 81 MG chewable tablet Chew 81 mg by mouth daily.    . carvedilol (COREG) 12.5 MG tablet Take 0.5 tablets (6.25 mg total) by mouth 2 (two) times daily. 60 tablet 11  . clopidogrel (PLAVIX) 75 MG tablet Take 1 tablet (75 mg total) by mouth at bedtime. 90 tablet 3  . EPINEPHrine 0.3 mg/0.3 mL IJ SOAJ injection Use as directed for life-threatening allergic reaction. (Patient taking differently: Inject 0.3 mg into the muscle as needed for anaphylaxis. Use as directed for life-threatening allergic reaction.) 4 Device 3  . ezetimibe (ZETIA) 10 MG tablet TAKE 1 TABLET BY MOUTH  DAILY (Patient taking differently: Take 10 mg by mouth daily.) 90  tablet 3  . famotidine (PEPCID) 20 MG tablet Take 1 tablet (20 mg total) by mouth daily. 30 tablet 5  . ferrous sulfate 325 (65 FE) MG tablet Take 325 mg by mouth 2 (two) times daily with a meal.    . hydrALAZINE (APRESOLINE) 25 MG tablet Take 1 tablet (25 mg total) by mouth 3 (three) times daily. 90 tablet 0  . insulin aspart (NOVOLOG) 100 UNIT/ML injection Inject 2-10 Units into the skin 3 (three) times daily before meals.    . isosorbide mononitrate (IMDUR) 30 MG 24 hr tablet Take 1 tablet (30 mg total) by mouth daily. 30 tablet 0  . latanoprost (XALATAN) 0.005 % ophthalmic solution Place 1 drop into both eyes at bedtime.    Marland Kitchen LEVEMIR FLEXTOUCH 100 UNIT/ML FlexPen Inject 0-50 Units into the skin daily.    Marland Kitchen LORazepam (ATIVAN) 0.5 MG tablet Take 1 tablet (0.5 mg total) by mouth daily as needed for anxiety. 30 tablet 2  . magnesium oxide (MAG-OX) 400 MG tablet Take 2 tablets (800 mg total) by mouth daily. Take 2 (400 mg) daily=800 mg (Patient taking differently: Take 400 mg by mouth daily.) 60 tablet 1  . nitroGLYCERIN (NITROSTAT) 0.4 MG SL tablet Place 1 tablet (0.4 mg total) under the tongue every 5 (five) minutes as needed for chest pain. 15 tablet 2  . Omega-3 Fatty Acids (FISH OIL PO) Take 1 capsule by mouth daily.    Marland Kitchen  OXYGEN Inhale 2 L into the lungs at bedtime.    . Probiotic CAPS Take 1 capsule by mouth daily.    . promethazine (PHENERGAN) 25 MG tablet Take 1 tablet (25 mg total) by mouth every 6 (six) hours as needed for nausea or vomiting. 90 tablet 1  . torsemide (DEMADEX) 20 MG tablet Take 2 tablets (40 mg total) by mouth daily.     No current facility-administered medications on file prior to visit.   Past Medical History:  Diagnosis Date  . Acquired absence of left great toe ([place])   . Acquired absence of other left toe(s) ([place])   . Anginal pain ([place])   . Atherosclerotic heart disease of native coronary artery without angina pectoris   . CAD (coronary artery disease)     08/2016 DES to RCA, also with history of LAD stenting.   . Chronic diastolic (congestive) heart failure ([place])   . Chronic kidney disease, stage 2 (mild)   . Chronic systolic (congestive) heart failure ([place])   . Contrast dye induced nephropathy, possible 09/03/2020  . Diabetes ([place])   . Diastolic CHF ([place])   . Gastro-esophageal reflux disease without esophagitis   . Hyperlipidemia   . Hypertension   . Hypertensive heart disease with heart failure ([place])   . Hypoxemia   . Idiopathic gout, multiple sites   . Localized edema   . Low back pain   . Mixed hyperlipidemia   . Mixed incontinence   . Morbid (severe) obesity due to excess calories ([place])   . Non-ST elevation (NSTEMI) myocardial infarction ([place]) 09/03/2020  . OSA (obstructive sleep apnea)   . OSA (obstructive sleep apnea) 09/03/2020  . Other specified hypothyroidism   . Primary insomnia   . Type 2 diabetes mellitus with complication, without long-term current use of insulin ([place]) 09/03/2020  . Type 2 diabetes mellitus with diabetic nephropathy [facility])    Past Surgical History:  Procedure Laterality Date  . ABDOMINAL HYSTERECTOMY     Partial- Still has both ovaries  . CARDIOVASCULAR STRESS TEST  08/13/2014   Nuclear; Normal  . CARPAL TUNNEL RELEASE    . CATARACT EXTRACTION, BILATERAL    . CESAREAN SECTION    . CHOLECYSTECTOMY    . CORONARY ANGIOPLASTY WITH STENT PLACEMENT     3 blockages/ 1 stent  . CORONARY STENT INTERVENTION N/A 09/29/2020   Procedure: CORONARY STENT INTERVENTION;  Surgeon: Sherren Mocha, MD;  Location: [facility];  Service: Cardiovascular;  Laterality: N/A;  CFX  . EYE SURGERY    . LEFT HEART CATH AND CORONARY ANGIOGRAPHY N/A 09/29/2020   Procedure: LEFT HEART CATH AND CORONARY ANGIOGRAPHY;  Surgeon: Sherren Mocha, MD;  Location: [facility];  Service: Cardiovascular;  Laterality: N/A;  . RIGHT/LEFT HEART CATH AND CORONARY ANGIOGRAPHY N/A 01/07/2017   Procedure: Right/Left Heart Cath and  Coronary Angiography;  Surgeon: Larey Dresser, MD;  Location: [facility];  Service: Cardiovascular;  Laterality: N/A;  . ROTATOR CUFF REPAIR    . TOE AMPUTATION Left   . US ECHOCARDIOGRAPHY  05/2017   Normal    Family History  Problem Relation Age of Onset  . Hypertension Father   . Coronary artery disease Mother   . Hypertension Mother   . Hyperlipidemia Mother   . Diabetes type II Mother   . Breast cancer Mother   . Lung cancer Mother   . Bipolar disorder Other   . Depression Other   . Schizophrenia Other  Social History   Socioeconomic History  . Marital status: Married    Spouse name: Not on file  . Number of children: Not on file  . Years of education: Not on file  . Highest education level: Not on file  Occupational History  . Not on file  Tobacco Use  . Smoking status: Former Research scientist (life sciences)  . Smokeless tobacco: Never Used  Vaping Use  . Vaping Use: Never used  Substance and Sexual Activity  . Alcohol use: Not Currently  . Drug use: Not Currently  . Sexual activity: Not on file  Other Topics Concern  . Not on file  Social History Narrative  . Not on file   Social Determinants of Health   Financial Resource Strain: Not on file  Food Insecurity: No Food Insecurity  . Worried About Charity fundraiser in the Last Year: Never true  . Ran Out of Food in the Last Year: Never true  Transportation Needs: No Transportation Needs  . Lack of Transportation (Medical): No  . Lack of Transportation (Non-Medical): No  Physical Activity: Insufficiently Active  . Days of Exercise per Week: 3 days  . Minutes of Exercise per Session: 40 min  Stress: Not on file  Social Connections: Not on file    Review of Systems  Constitutional: Negative for chills, fatigue and fever.  HENT: Negative for congestion, rhinorrhea and sore throat.   Respiratory: Positive for shortness of breath (improved. ). Negative for cough.   Cardiovascular: Negative for chest pain and leg  swelling.  Gastrointestinal: Negative for abdominal pain, constipation, diarrhea, nausea and vomiting.  Endocrine: Positive for polyuria. Negative for polydipsia and polyphagia.  Genitourinary: Negative for dysuria and urgency.  Musculoskeletal: Positive for arthralgias (hands hurt) and back pain (lumbar). Negative for myalgias.  Neurological: Negative for dizziness, weakness, light-headedness and headaches.  Psychiatric/Behavioral: Negative for dysphoric mood. The patient is nervous/anxious.   Increased stress concerning her husband, who saw Dr. Harriet Masson and told he has a dilated aorta and with his atrial fibrillation, he was sent to Dr. Curt Bears. He saw Dr. Orvan Seen, CV surgeon, he is getting cta chest every 6 months.   Objective:  BP 136/60   Pulse 72   Temp (!) 97.5 F (36.4 C)   Resp 18   Ht 5\' 5"  (1.651 m)   Wt 243 lb (110.2 kg)   BMI 40.44 kg/m   BP/Weight 10/25/2020 03/17/3299 01/18/2262  Systolic BP 335 456 256  Diastolic BP 54 62 60  Wt. (Lbs) 244.4 241.2 243  BMI 40.67 40.14 40.44    Physical Exam Vitals reviewed.  Constitutional:      Appearance: Normal appearance. She is obese.  Neck:     Vascular: No carotid bruit.  Cardiovascular:     Rate and Rhythm: Normal rate and regular rhythm.     Heart sounds: Normal heart sounds.  Pulmonary:     Effort: Pulmonary effort is normal.     Breath sounds: Normal breath sounds.  Abdominal:     Palpations: Abdomen is soft.     Tenderness: There is no abdominal tenderness.  Neurological:     Mental Status: She is alert and oriented to person, place, and time.  Psychiatric:        Mood and Affect: Mood normal.        Behavior: Behavior normal.     Lab Results  Component Value Date   WBC 8.5 10/17/2020   HGB 8.7 (L) 10/17/2020   HCT 27.1 (L) 10/17/2020  PLT 355 10/17/2020   GLUCOSE 54 (L) 10/17/2020   CHOL 175 10/17/2020   TRIG 130 10/17/2020   HDL 43 10/17/2020   LDLCALC 109 (H) 10/17/2020   ALT 12 10/17/2020   AST 12  10/17/2020   NA 143 10/17/2020   K 4.4 10/17/2020   CL 104 10/17/2020   CREATININE 1.81 (H) 10/17/2020   BUN 44 (H) 10/17/2020   CO2 24 10/17/2020   TSH 3.590 03/23/2020   INR 1.08 01/01/2017   HGBA1C 6.8 (H) 09/30/2020      Assessment & Plan:   1. Hypertensive heart and renal disease with congestive heart failure ([place]) The current medical regimen is effective;  continue present plan and medications.  2. Diabetic polyneuropathy associated with type 2 diabetes mellitus ([place]) Control: well controlled.  Recommend check sugars qac and qhs.  Recommend check feet daily. Recommend annual eye exams. Medicines: continue current medicines.  Continue to work on eating a healthy diet and exercise.   3. CKD stage 4 secondary to hypertension (HCC) - PTH, Intact and Calcium  4. Mixed hyperlipidemia Defer mgmt to cardiology. Would benefit from repatha or praluent.  - Lipid panel - Comprehensive metabolic panel  5. Anemia due to stage 4 chronic kidney disease (HCC) Multifactorial. - CBC with Differential/Platelet  6. Paresthesia - B12 and Folate Panel - Methylmalonic acid, serum  7. Acute non-ST elevation myocardial infarction (NSTEMI) ([place]) Treated with a stent.     No orders of the defined types were placed in this encounter.   Orders Placed This Encounter  Procedures  . Lipid panel  . Comprehensive metabolic panel  . CBC with Differential/Platelet  . B12 and Folate Panel  . Methylmalonic acid, serum  . PTH, Intact and Calcium  . Cardiovascular Risk Assessment     Follow-up: Return in about 3 months (around 01/16/2021) for fasting.  An After Visit Summary was printed and given to the patient.  [name], MD [facility] [phone]

## 2020-10-17 NOTE — Telephone Encounter (Signed)
Pt stated over the weekend she gained 5 to 6 lbs , currently she is back to her normal weight , when this happens on the weekend is it a number she can call to report this change, or wait until Monday morning to call nurse. Please advise

## 2020-10-17 NOTE — Telephone Encounter (Signed)
She needs to call the office number. After hour services will answer and connect her to the on call doctor. I will call patient to advise.

## 2020-10-17 NOTE — Telephone Encounter (Signed)
Called and spoke with patient she is aware and thanked me for the return call.

## 2020-10-19 ENCOUNTER — Encounter: Payer: Self-pay | Admitting: Internal Medicine

## 2020-10-19 ENCOUNTER — Telehealth (INDEPENDENT_AMBULATORY_CARE_PROVIDER_SITE_OTHER): Payer: HMO | Admitting: Internal Medicine

## 2020-10-19 VITALS — BP 127/62 | HR 72 | Wt 241.2 lb

## 2020-10-19 DIAGNOSIS — N183 Chronic kidney disease, stage 3 unspecified: Secondary | ICD-10-CM | POA: Diagnosis not present

## 2020-10-19 DIAGNOSIS — E785 Hyperlipidemia, unspecified: Secondary | ICD-10-CM | POA: Diagnosis not present

## 2020-10-19 DIAGNOSIS — I2510 Atherosclerotic heart disease of native coronary artery without angina pectoris: Secondary | ICD-10-CM

## 2020-10-19 DIAGNOSIS — I5032 Chronic diastolic (congestive) heart failure: Secondary | ICD-10-CM

## 2020-10-19 NOTE — Progress Notes (Signed)
Virtual Visit via Video Note   This visit type was conducted due to national recommendations for restrictions regarding the COVID-19 Pandemic (e.g. social distancing) in an effort to limit this patient's exposure and mitigate transmission in our community.  Due to her co-morbid illnesses, this patient is at least at moderate risk for complications without adequate follow up.  This format is felt to be most appropriate for this patient at this time.  All issues noted in this document were discussed and addressed.  A limited physical exam was performed with this format.  Please refer to the patient's chart for her consent to telehealth for [facility].   Evaluation Performed:  Caregility video visit  Date:  10/19/2020   ID:  Dana [name], DOB 1961/06/13, MRN [id number]  Patient Location:  [address] [place] [address]  Provider location:   [address] [place], [place] [address]  PCP:  [name], MD  Cardiologist:  [name], MD Electrophysiologist:  None   Chief Complaint:  No complaints  History of Present Illness:    Dana [name] is a 60 y.o. female who presents via audio/video conferencing for a telehealth visit today.  HPI:  Dana [name] is a 60 y.o. female who is being seen today for the evaluation of dyslipidemia at the request of Cox, Kirsten, MD.  [name] is a complex 60 year old female with a history of obesity, diastolic heart failure, neuropathy, hypertension, dyslipidemia and coronary artery disease with 4 prior stents as well as PAF status post ablation and PAD with left toe amputation.  She presents for evaluation and management of dyslipidemia.  In August 2020 her total cholesterol was 195, triglycerides 136, HDL 47 and LDL 121.  Repeat labs in November showed the total cholesterol gone up to 228 with an LDL of 154.  She reports compliance with her medications including ezetimibe 10 mg daily and pravastatin 80 mg nightly.  Her target LDL is  less than 70.  Deviously she had been approved for and started on Repatha which was arranged through her primary care physician Dr. [name].  She had a 40-month approval but started only in the second month and then subsequently required some sampling.  Ultimately she ran out of the medication and could not afford it and therefore has not been on it.  She was referred for definitive therapies.  12/24/2019  Dana [name] is seen today for follow-up. Overall she is doing well. She was approved for the health well foundation grant therefore she gets her medicine essentially for free. He has been using the medication with good response and no side effects. For some reason the lipid profile was run twice at her primary care provider's office however the numbers are essentially the same. Her total cholesterol was 127, triglycerides 124, HDL 49 and LDL 56 (reduced from 130) approximately 3 months ago.   10/19/2020   Dana [name] returns today for follow-up.  This is a routine annual follow-up.  Reports that she was recently hospitalized with diastolic heart failure, shortness of breath and troponin elevation up to 1917.  She initially did not undergo cardiac catheterization due to elevated creatinine however presented again with symptoms of unstable angina and underwent cardiac catheterization which demonstrated three-vessel coronary disease with severe mid circumflex stenosis and had PCI with a drug-eluting stent.  Since that time she says she has been doing much better.  Her lipids were recently reassessed as an inpatient which showed total cholesterol 101,  triglycerides 77, HDL 42 and LDL 44.  Overall showing generally very good control of her lipids.  She also remains on ezetimibe.   The patient does not have symptoms concerning for COVID-19 infection (fever, chills, cough, or new SHORTNESS OF BREATH).    Prior CV studies:   The following studies were reviewed today:  Labs  PMHx:  Past Medical History:   Diagnosis Date  . Acquired absence of left great toe ([place])   . Acquired absence of other left toe(s) ([place])   . Anginal pain ([place])   . Atherosclerotic heart disease of native coronary artery without angina pectoris   . CAD (coronary artery disease)    08/2016 DES to RCA, also with history of LAD stenting.   . Chronic diastolic (congestive) heart failure ([place])   . Chronic kidney disease, stage 2 (mild)   . Chronic systolic (congestive) heart failure ([place])   . Contrast dye induced nephropathy, possible 09/03/2020  . Diabetes ([place])   . Diastolic CHF ([place])   . Gastro-esophageal reflux disease without esophagitis   . Hyperlipidemia   . Hypertension   . Hypertensive heart disease with heart failure ([place])   . Hypoxemia   . Idiopathic gout, multiple sites   . Localized edema   . Low back pain   . Mixed hyperlipidemia   . Mixed incontinence   . Morbid (severe) obesity due to excess calories ([place])   . Non-ST elevation (NSTEMI) myocardial infarction ([place]) 09/03/2020  . OSA (obstructive sleep apnea)   . OSA (obstructive sleep apnea) 09/03/2020  . Other specified hypothyroidism   . Primary insomnia   . Type 2 diabetes mellitus with complication, without long-term current use of insulin ([place]) 09/03/2020  . Type 2 diabetes mellitus with diabetic nephropathy [facility])     Past Surgical History:  Procedure Laterality Date  . ABDOMINAL HYSTERECTOMY     Partial- Still has both ovaries  . CARDIOVASCULAR STRESS TEST  08/13/2014   Nuclear; Normal  . CARPAL TUNNEL RELEASE    . CATARACT EXTRACTION, BILATERAL    . CESAREAN SECTION    . CHOLECYSTECTOMY    . CORONARY ANGIOPLASTY WITH STENT PLACEMENT     3 blockages/ 1 stent  . CORONARY STENT INTERVENTION N/A 09/29/2020   Procedure: CORONARY STENT INTERVENTION;  Surgeon: Sherren Mocha, MD;  Location: [facility];  Service: Cardiovascular;  Laterality: N/A;  CFX  . EYE SURGERY    . LEFT HEART CATH AND CORONARY ANGIOGRAPHY N/A 09/29/2020    Procedure: LEFT HEART CATH AND CORONARY ANGIOGRAPHY;  Surgeon: Sherren Mocha, MD;  Location: [facility];  Service: Cardiovascular;  Laterality: N/A;  . RIGHT/LEFT HEART CATH AND CORONARY ANGIOGRAPHY N/A 01/07/2017   Procedure: Right/Left Heart Cath and Coronary Angiography;  Surgeon: Larey Dresser, MD;  Location: [facility];  Service: Cardiovascular;  Laterality: N/A;  . ROTATOR CUFF REPAIR    . TOE AMPUTATION Left   . US ECHOCARDIOGRAPHY  05/2017   Normal    FAMHx:  Family History  Problem Relation Age of Onset  . Hypertension Father   . Coronary artery disease Mother   . Hypertension Mother   . Hyperlipidemia Mother   . Diabetes type II Mother   . Breast cancer Mother   . Lung cancer Mother   . Bipolar disorder Other   . Depression Other   . Schizophrenia Other     SOCHx:   reports that she has quit smoking. She has never used smokeless tobacco.  She reports previous alcohol use. She reports previous drug use.  ALLERGIES:  Allergies  Allergen Reactions  . Naproxen Hives and Rash  . Celecoxib Other (See Comments)    Stomach pain  . Aspirin Other (See Comments)    CAN NOT TAKE DUE TO ULCERS (documented previously) but takes every day currently (as of 09/29/20) without reported issues  . Glucosamine Hives, Swelling and Other (See Comments)    Angioedema   . Iron     IV iron transfusion - hives - Feb 2022 hospitalization  . Metoclopramide Other (See Comments)    Facial twitching and stuttering  . Metolazone Other (See Comments)    Acute renal failure  . Shellfish-Derived Products Hives, Swelling and Other (See Comments)    Angioedema   . Tramadol Nausea And Vomiting         MEDS:  Current Meds  Medication Sig  . allopurinol (ZYLOPRIM) 100 MG tablet Take 1 tablet (100 mg total) by mouth daily.  Marland Kitchen aspirin 81 MG chewable tablet Chew 81 mg by mouth daily.  . carvedilol (COREG) 12.5 MG tablet Take 0.5 tablets (6.25 mg total) by mouth 2 (two) times  daily.  . clopidogrel (PLAVIX) 75 MG tablet Take 1 tablet (75 mg total) by mouth at bedtime.  Marland Kitchen EPINEPHrine 0.3 mg/0.3 mL IJ SOAJ injection Use as directed for life-threatening allergic reaction. (Patient taking differently: Inject 0.3 mg into the muscle as needed for anaphylaxis. Use as directed for life-threatening allergic reaction.)  . Evolocumab (REPATHA SURECLICK) 270 MG/ML SOAJ Inject 1 Dose into the skin every 14 (fourteen) days.  Marland Kitchen ezetimibe (ZETIA) 10 MG tablet TAKE 1 TABLET BY MOUTH  DAILY (Patient taking differently: Take 10 mg by mouth daily.)  . famotidine (PEPCID) 20 MG tablet Take 1 tablet (20 mg total) by mouth daily.  . ferrous sulfate 325 (65 FE) MG tablet Take 325 mg by mouth 2 (two) times daily with a meal.  . hydrALAZINE (APRESOLINE) 25 MG tablet Take 1 tablet (25 mg total) by mouth 3 (three) times daily.  Marland Kitchen HYDROcodone-acetaminophen (NORCO) 7.5-325 MG tablet Take 1 tablet by mouth 3 (three) times daily as needed for moderate pain.  Marland Kitchen insulin aspart (NOVOLOG) 100 UNIT/ML injection Inject 2-10 Units into the skin 3 (three) times daily before meals.  . isosorbide mononitrate (IMDUR) 30 MG 24 hr tablet Take 1 tablet (30 mg total) by mouth daily.  Marland Kitchen latanoprost (XALATAN) 0.005 % ophthalmic solution Place 1 drop into both eyes at bedtime.  Marland Kitchen LEVEMIR FLEXTOUCH 100 UNIT/ML FlexPen Inject 0-50 Units into the skin daily.  Marland Kitchen LORazepam (ATIVAN) 0.5 MG tablet Take 1 tablet (0.5 mg total) by mouth daily as needed for anxiety.  . magnesium oxide (MAG-OX) 400 MG tablet Take 2 tablets (800 mg total) by mouth daily. Take 2 (400 mg) daily=800 mg (Patient taking differently: Take 400 mg by mouth daily.)  . nitroGLYCERIN (NITROSTAT) 0.4 MG SL tablet Place 1 tablet (0.4 mg total) under the tongue every 5 (five) minutes as needed for chest pain.  . Omega-3 Fatty Acids (FISH OIL PO) Take 1 capsule by mouth daily.  . OXYGEN Inhale 2 L into the lungs at bedtime.  . Probiotic CAPS Take 1 capsule by  mouth daily.  . promethazine (PHENERGAN) 25 MG tablet Take 1 tablet (25 mg total) by mouth every 6 (six) hours as needed for nausea or vomiting.  . torsemide (DEMADEX) 20 MG tablet Take 2 tablets (40 mg total) by mouth daily.  ROS: Pertinent items noted in HPI and remainder of comprehensive ROS otherwise negative.  Labs/Other Tests and Data Reviewed:    Recent Labs: 03/23/2020: TSH 3.590 09/29/2020: B Natriuretic Peptide 268.7 10/01/2020: Magnesium 2.2 10/12/2020: ALT 10; BUN 41; Creatinine, Ser 1.81; Hemoglobin 8.8; NT-Pro BNP 3,407; Platelets 389; Potassium 4.2; Sodium 139   Recent Lipid Panel Lab Results  Component Value Date/Time   CHOL 101 09/30/2020 02:41 AM   CHOL 121 06/28/2020 10:03 AM   TRIG 77 09/30/2020 02:41 AM   HDL 42 09/30/2020 02:41 AM   HDL 51 06/28/2020 10:03 AM   CHOLHDL 2.4 09/30/2020 02:41 AM   LDLCALC 44 09/30/2020 02:41 AM   LDLCALC 51 06/28/2020 10:03 AM    Wt Readings from Last 3 Encounters:  10/19/20 241 lb 3.2 oz (109.4 kg)  10/17/20 243 lb (110.2 kg)  10/06/20 237 lb 9.6 oz (107.8 kg)     Exam:    Vital Signs:  BP 127/62   Pulse 72   Wt 241 lb 3.2 oz (109.4 kg)   SpO2 98%   BMI 40.14 kg/m    General appearance: alert, no distress and morbidly obese Lungs: no visual respiratory difficulty Extremities: extremities normal, atraumatic, no cyanosis or edema Skin: Skin color, texture, turgor normal. No rashes or lesions Neurologic: Mental status: Alert, oriented, thought content appropriate Psych: Pleasant  ASSESSMENT & PLAN:    1. Mixed dyslipidemia, goal LDL less than 70 2. Coronary artery disease with prior PCI 3. Chronic diastolic heart failure 4. PAF status post ablation 5. PAD 6. Type 2 diabetes with neuropathy  Dana [name] unfortunately was hospitalized in February and then again in March with symptoms of unstable angina and diastolic heart failure.  She was found to have multivessel coronary disease with severe stenosis of the  circumflex and underwent PCI.  Since then she has been doing much better.  She continues to have an excellent response to Repatha - LDL now 44.  Her lipids appear to be well controlled, however I wonder whether she may have a high LP(a) component is driving her cardiovascular events.  I would like to check that with her next lipid profile which was ordered by her PCP a few days ago to be repeated in 3 months.  We will continue to reauthorize her medication on an annual basis. No medication changes today - follow-up annually.  COVID-19 Education: The signs and symptoms of COVID-19 were discussed with the patient and how to seek care for testing (follow up with PCP or arrange E-visit).  The importance of social distancing was discussed today.  Patient Risk:   After full review of this patients clinical status, I feel that they are at least moderate risk at this time.  Time:   Today, I have spent 15 minutes with the patient with telehealth technology discussing dyslipidemia.     Medication Adjustments/Labs and Tests Ordered: Current medicines are reviewed at length with the patient today.  Concerns regarding medicines are outlined above.   Tests Ordered: No orders of the defined types were placed in this encounter.   Medication Changes: No orders of the defined types were placed in this encounter.   Disposition:  in 1 year(s)  Pixie Casino, MD, [facility], [place] Director of the Advanced Lipid Disorders &  Cardiovascular Risk Reduction Clinic Diplomate of the American Board of Clinical Lipidology Attending Cardiologist  Direct Dial: [phone]  Fax: [phone]  Website:  [email or web address]  Nadean Corwin  Coby Antrobus, MD  10/19/2020 8:06 AM

## 2020-10-19 NOTE — Addendum Note (Signed)
Addended by: Fidel Levy on: 10/19/2020 08:30 AM   Modules accepted: Orders

## 2020-10-19 NOTE — Patient Instructions (Signed)
Medication Instructions:  Your physician recommends that you continue on your current medications as directed. Please refer to the Current Medication list given to you today.  Patient Assistance:  The Health Well foundation offers assistance to help pay for medication copays.  They will cover copays for all cholesterol lowering meds, including statins, fibrates, omega-3 oils, ezetimibe, Repatha, Praluent, Nexletol, Nexlizet.  The cards are usually good for $2,500 or 12 months, whichever comes first. 1. Go to healthwellfoundation.org 2. Click on "Apply Now" 3. Answer questions as to whom is applying (patient or representative) 4. Your disease fund will be "hypercholesterolemia - Medicare access" 5. They will ask questions about finances and which medications you are taking for cholesterol 6. When you submit, the approval is usually within minutes.  You will need to print the card information from the site 7. You will need to show this information to your pharmacy, they will bill your Medicare Part D plan first -then bill Health Well --for the copay.   You can also call them at [phone], although the hold times can be quite long.   *If you need a refill on your cardiac medications before your next appointment, please call your pharmacy*   Lab Work: LP(a) with lab work Dr. Tobie Poet has ordere din 3 months  FASTING lipid panel in 1 year (before your next visit with Dr. Debara Pickett)  If you have labs (blood work) drawn today and your tests are completely normal, you will receive your results only by: Marland Kitchen MyChart Message (if you have MyChart) OR . A paper copy in the mail If you have any lab test that is abnormal or we need to change your treatment, we will call you to review the results.   Testing/Procedures: NONE   Follow-Up: At [facility], you and your health needs are our priority.  As part of our continuing mission to provide you with exceptional heart care, we have created designated  Provider Care Teams.  These Care Teams include your primary Cardiologist (physician) and Advanced Practice Providers (APPs -  Physician Assistants and Nurse Practitioners) who all work together to provide you with the care you need, when you need it.  We recommend signing up for the patient portal called "MyChart".  Sign up information is provided on this After Visit Summary.  MyChart is used to connect with patients for Virtual Visits (Telemedicine).  Patients are able to view lab/test results, encounter notes, upcoming appointments, etc.  Non-urgent messages can be sent to your provider as well.   To learn more about what you can do with MyChart, go to [email or web address].    Your next appointment:   12 month(s)  The format for your next appointment:   In Person or Virtual  Provider:   Raliegh Ip Mali Hilty, MD   Other Instructions

## 2020-10-21 ENCOUNTER — Telehealth: Payer: Self-pay | Admitting: Internal Medicine

## 2020-10-21 LAB — CBC WITH DIFFERENTIAL/PLATELET
Basophils Absolute: 0.1 10*3/uL (ref 0.0–0.2)
Basos: 1 %
EOS (ABSOLUTE): 0.2 10*3/uL (ref 0.0–0.4)
Eos: 2 %
Hematocrit: 27.1 % — ABNORMAL LOW (ref 34.0–46.6)
Hemoglobin: 8.7 g/dL — ABNORMAL LOW (ref 11.1–15.9)
Immature Grans (Abs): 0 10*3/uL (ref 0.0–0.1)
Immature Granulocytes: 0 %
Lymphocytes Absolute: 2 10*3/uL (ref 0.7–3.1)
Lymphs: 23 %
MCH: 27.5 pg (ref 26.6–33.0)
MCHC: 32.1 g/dL (ref 31.5–35.7)
MCV: 86 fL (ref 79–97)
Monocytes Absolute: 0.6 10*3/uL (ref 0.1–0.9)
Monocytes: 8 %
Neutrophils Absolute: 5.6 10*3/uL (ref 1.4–7.0)
Neutrophils: 66 %
Platelets: 355 10*3/uL (ref 150–450)
RBC: 3.16 x10E6/uL — ABNORMAL LOW (ref 3.77–5.28)
RDW: 15.4 % (ref 11.7–15.4)
WBC: 8.5 10*3/uL (ref 3.4–10.8)

## 2020-10-21 LAB — LIPID PANEL
Chol/HDL Ratio: 4.1 ratio (ref 0.0–4.4)
Cholesterol, Total: 175 mg/dL (ref 100–199)
HDL: 43 mg/dL (ref >39)
LDL Chol Calc (NIH): 109 mg/dL — ABNORMAL HIGH (ref 0–99)
Triglycerides: 130 mg/dL (ref 0–149)
VLDL Cholesterol Cal: 23 mg/dL (ref 5–40)

## 2020-10-21 LAB — COMPREHENSIVE METABOLIC PANEL
ALT: 12 IU/L (ref 0–32)
AST: 12 IU/L (ref 0–40)
Albumin/Globulin Ratio: 1.4 (ref 1.2–2.2)
Albumin: 4.1 g/dL (ref 3.8–4.9)
Alkaline Phosphatase: 122 IU/L — ABNORMAL HIGH (ref 44–121)
BUN/Creatinine Ratio: 24 — ABNORMAL HIGH (ref 9–23)
BUN: 44 mg/dL — ABNORMAL HIGH (ref 6–24)
Bilirubin Total: 0.3 mg/dL (ref 0.0–1.2)
CO2: 24 mmol/L (ref 20–29)
Calcium: 9.1 mg/dL (ref 8.7–10.2)
Chloride: 104 mmol/L (ref 96–106)
Creatinine, Ser: 1.81 mg/dL — ABNORMAL HIGH (ref 0.57–1.00)
Globulin, Total: 2.9 g/dL (ref 1.5–4.5)
Glucose: 54 mg/dL — ABNORMAL LOW (ref 65–99)
Potassium: 4.4 mmol/L (ref 3.5–5.2)
Sodium: 143 mmol/L (ref 134–144)
Total Protein: 7 g/dL (ref 6.0–8.5)
eGFR: 32 mL/min/{1.73_m2} — ABNORMAL LOW (ref >59)

## 2020-10-21 LAB — METHYLMALONIC ACID, SERUM: Methylmalonic Acid: 598 nmol/L — ABNORMAL HIGH (ref 0–378)

## 2020-10-21 LAB — PTH, INTACT AND CALCIUM: PTH: 105 pg/mL — ABNORMAL HIGH (ref 15–65)

## 2020-10-21 LAB — B12 AND FOLATE PANEL
Folate: 4.4 ng/mL (ref >3.0)
Vitamin B-12: 375 pg/mL (ref 232–1245)

## 2020-10-21 LAB — CARDIOVASCULAR RISK ASSESSMENT

## 2020-10-21 MED ORDER — REPATHA SURECLICK 140 MG/ML ~~LOC~~ SOAJ
1.0000 | SUBCUTANEOUS | 3 refills | Status: DC
Start: 2020-10-21 — End: 2021-07-13

## 2020-10-21 NOTE — Telephone Encounter (Signed)
repatha approved  PA Case: [id number], Status: Approved, Coverage Starts on: 10/21/2020 12:00:00 AM, Coverage Ends on: 10/21/2021 12:00:00 AM.

## 2020-10-21 NOTE — Telephone Encounter (Signed)
Rx(s) sent to pharmacy electronically.  

## 2020-10-21 NOTE — Telephone Encounter (Signed)
   *  STAT* If patient is at the pharmacy, call can be transferred to refill team.   1. Which medications need to be refilled? (please list name of each medication and dose if known)   Evolocumab (REPATHA SURECLICK) 980 MG/ML SOAJ    2. Which pharmacy/location (including street and city if local pharmacy) is medication to be sent to? [place], [place]  3. Do they need a 30 day or 90 day supply? 90 days

## 2020-10-24 ENCOUNTER — Other Ambulatory Visit: Payer: Self-pay

## 2020-10-25 ENCOUNTER — Ambulatory Visit (HOSPITAL_COMMUNITY)
Admit: 2020-10-25 | Discharge: 2020-10-25 | Disposition: A | Discharge status: Home / Self Care | Payer: HMO | Attending: Cardiology | Admitting: Cardiology

## 2020-10-25 ENCOUNTER — Encounter (HOSPITAL_COMMUNITY): Payer: Self-pay

## 2020-10-25 ENCOUNTER — Other Ambulatory Visit: Payer: Self-pay

## 2020-10-25 VITALS — BP 126/54 | HR 80 | Wt 244.4 lb

## 2020-10-25 DIAGNOSIS — I130 Hypertensive heart and chronic kidney disease with heart failure and stage 1 through stage 4 chronic kidney disease, or unspecified chronic kidney disease: Secondary | ICD-10-CM | POA: Insufficient documentation

## 2020-10-25 DIAGNOSIS — G720 Drug-induced myopathy: Secondary | ICD-10-CM | POA: Diagnosis not present

## 2020-10-25 DIAGNOSIS — I2510 Atherosclerotic heart disease of native coronary artery without angina pectoris: Secondary | ICD-10-CM | POA: Insufficient documentation

## 2020-10-25 DIAGNOSIS — Z8249 Family history of ischemic heart disease and other diseases of the circulatory system: Secondary | ICD-10-CM | POA: Diagnosis not present

## 2020-10-25 DIAGNOSIS — E1142 Type 2 diabetes mellitus with diabetic polyneuropathy: Secondary | ICD-10-CM | POA: Diagnosis not present

## 2020-10-25 DIAGNOSIS — Z9861 Coronary angioplasty status: Secondary | ICD-10-CM

## 2020-10-25 DIAGNOSIS — I252 Old myocardial infarction: Secondary | ICD-10-CM | POA: Diagnosis not present

## 2020-10-25 DIAGNOSIS — N184 Chronic kidney disease, stage 4 (severe): Secondary | ICD-10-CM | POA: Diagnosis not present

## 2020-10-25 DIAGNOSIS — Z955 Presence of coronary angioplasty implant and graft: Secondary | ICD-10-CM | POA: Diagnosis not present

## 2020-10-25 DIAGNOSIS — D649 Anemia, unspecified: Secondary | ICD-10-CM | POA: Diagnosis not present

## 2020-10-25 DIAGNOSIS — E1122 Type 2 diabetes mellitus with diabetic chronic kidney disease: Secondary | ICD-10-CM | POA: Insufficient documentation

## 2020-10-25 DIAGNOSIS — Z7902 Long term (current) use of antithrombotics/antiplatelets: Secondary | ICD-10-CM | POA: Insufficient documentation

## 2020-10-25 DIAGNOSIS — I5032 Chronic diastolic (congestive) heart failure: Secondary | ICD-10-CM

## 2020-10-25 DIAGNOSIS — K3184 Gastroparesis: Secondary | ICD-10-CM | POA: Insufficient documentation

## 2020-10-25 DIAGNOSIS — I5042 Chronic combined systolic (congestive) and diastolic (congestive) heart failure: Secondary | ICD-10-CM | POA: Insufficient documentation

## 2020-10-25 DIAGNOSIS — T466X5A Adverse effect of antihyperlipidemic and antiarteriosclerotic drugs, initial encounter: Secondary | ICD-10-CM | POA: Diagnosis not present

## 2020-10-25 DIAGNOSIS — Z794 Long term (current) use of insulin: Secondary | ICD-10-CM | POA: Diagnosis not present

## 2020-10-25 DIAGNOSIS — Z87891 Personal history of nicotine dependence: Secondary | ICD-10-CM | POA: Insufficient documentation

## 2020-10-25 DIAGNOSIS — G4733 Obstructive sleep apnea (adult) (pediatric): Secondary | ICD-10-CM | POA: Diagnosis not present

## 2020-10-25 DIAGNOSIS — Z79899 Other long term (current) drug therapy: Secondary | ICD-10-CM | POA: Insufficient documentation

## 2020-10-25 DIAGNOSIS — Z7982 Long term (current) use of aspirin: Secondary | ICD-10-CM | POA: Diagnosis not present

## 2020-10-25 DIAGNOSIS — E782 Mixed hyperlipidemia: Secondary | ICD-10-CM | POA: Insufficient documentation

## 2020-10-25 MED ORDER — HYDROCODONE-ACETAMINOPHEN 7.5-325 MG PO TABS: 1.0000 | ORAL_TABLET | Freq: Three times a day (TID) | ORAL | 0 refills | Status: DC | PRN

## 2020-10-25 NOTE — Progress Notes (Signed)
Advanced Heart Failure Clinic Note   Referring Physician: PCP: Dana Brome, MD PCP-Cardiologist: Dana Bickers, MD   Reason for Visit: [facility] F/u s/p NSTEMI s/p PCI + Chronic Diastolic Heart Falilure  HPI:  Dana [name] is 60 year old with history of diastolic heart failure, CAD, DES RCA/LAD 2018, DMI, htn, hyperlipidemia, PAD, and OSA  Admitted 08/29/20 with chest pain and increased shortness of breath. Had CTA that negative for PE. HS Trop 41>66. This was initially thought to be from demand ischemia. Had chest pain again with HS Trop up to 1917. Placed on heparin drip + Ntg drip. Also diuresed with IV lasix. Cath was not pursued with creatinine 3.4. Hospital course complicated by AKI and anemia so cath was not pursued.  09/29/20 Patient woke up during the middle the night with sharp chest pain and shortness of breath with exertion.  She presented with ST depressions in V4 and V5 and high-sensitivity troponin that increased from 60>239 and a chest x-ray consistent with pulmonary edema.  Additionally she had a leukocytosis on admission which was felt to be reactive in the setting of NSTEMI.   She was started on nitro and heparin drip with improvement in symptoms.  She was taken for left heart cath:  1. 3 vessel CAD with moderate mid-LAD and mid-RCA stenoses, and severe mid-LCx stenosis 2. Successful PCI of the mid-circumflex, reducing severe 90% stenosis to 0% with a 2.75x22 mm Resolute Onyx DES, provisional side branch angioplasty of the OM1 with a 2.5 mm balloon through the stent struts 3. Moderately elevated LVEDP   After her left heart catheterization with PCI her chest pain symptoms resolved completely.  Unfortunately she developed a contrast-induced nephropathy and her creatinine went from 2.2 up to a peak of 5.5.  She remained volume overloaded at this time.  Additionally, she developed severe nausea and vomiting, gastroparesis and required IV antiplatelet therapy.  While  on this therapy she developed epistaxis and acute blood loss anemia requiring transfusion.  She was given promotility agents given her history of gastroparesis her nausea and vomiting improved.  Epistaxis was treated with nasal Afrin.  She was switched back to oral DAPT. During this time diuretics were reintroduced and her creatinine began to improve over the following days.  5.5>>4.5>>3.5>2.9.  Her volume status improved significantly, she no longer had bleeding, remained chest pain free and had no further nausea and vomiting.  Her leukocytosis persisted but all her infectious workup was negative and she was afebrile throughout admission and this was felt to be reactive.  She was discharged on 3/24. She had f/u BMP, post discharge, done on 3/20. Creatine had further improved, down from 2.94>>1.81.  She returns now for post hospital f/u. Doing well. Had f/u w/ her PCP, Dr. Tobie Poet, last week and had repeat labs. SCr remained stable at 1.81. Hgb stable at 8.7. She denies CP. No dyspnea. Wt has been stable at home. Regularly monitors BP at home, SBPs in the 120s. No hypotension. Compliant w/ meds. Volume status has been good. She has a CardioMEMs. CardioMEMs: Goal dPAP: 10  Today's dPAP: 5 (volume control has been good, persistently below goal).      Echo 09/30/20 1. Left ventricular ejection fraction, by estimation, is 55 to 60%. The left ventricle has normal function. Left ventricular endocardial border not optimally defined to evaluate regional wall motion, despite the use of Definity contrast. There is mild left ventricular hypertrophy. Left ventricular diastolic parameters are consistent with Grade II diastolic dysfunction (pseudonormalization).  2. Right ventricular systolic function is normal. The right ventricular size is mildly enlarged. There is normal pulmonary artery systolic pressure. The estimated right ventricular systolic pressure is 70.0 mmHg. 3. Left atrial size was mildly dilated. 4.  Right atrial size was mildly dilated. 5. The mitral valve was not well visualized. Trivial mitral valve regurgitation. 6. The aortic valve was not well visualized. Aortic valve regurgitation is not visualized. No aortic stenosis is present. 7. The inferior vena cava is normal in size with <50% respiratory variability, suggesting right atrial pressure of 8 mmHg.   LHC 09/29/20  1. 3 vessel CAD with moderate mid-LAD and mid-RCA stenoses, and severe mid-LCx stenosis 2. Successful PCI of the mid-circumflex, reducing severe 90% stenosis to 0% with a 2.75x22 mm Resolute Onyx DES, provisional side branch angioplasty of the OM1 with a 2.5 mm balloon through the stent struts    Review of systems complete and found to be negative unless listed in HPI.     Past Medical History:  Diagnosis Date  . Acquired absence of left great toe ([place])   . Acquired absence of other left toe(s) ([place])   . Anginal pain ([place])   . Atherosclerotic heart disease of native coronary artery without angina pectoris   . CAD (coronary artery disease)    08/2016 DES to RCA, also with history of LAD stenting.   . Chronic diastolic (congestive) heart failure ([place])   . Chronic kidney disease, stage 2 (mild)   . Chronic systolic (congestive) heart failure ([place])   . Contrast dye induced nephropathy, possible 09/03/2020  . Diabetes ([place])   . Diastolic CHF ([place])   . Gastro-esophageal reflux disease without esophagitis   . Hyperlipidemia   . Hypertension   . Hypertensive heart disease with heart failure ([place])   . Hypoxemia   . Idiopathic gout, multiple sites   . Localized edema   . Low back pain   . Mixed hyperlipidemia   . Mixed incontinence   . Morbid (severe) obesity due to excess calories ([place])   . Non-ST elevation (NSTEMI) myocardial infarction ([place]) 09/03/2020  . OSA (obstructive sleep apnea)   . OSA (obstructive sleep apnea) 09/03/2020  . Other specified hypothyroidism   . Primary insomnia   . Type 2 diabetes  mellitus with complication, without long-term current use of insulin ([place]) 09/03/2020  . Type 2 diabetes mellitus with diabetic nephropathy ([facility])     Current Outpatient Medications  Medication Sig Dispense Refill  . allopurinol (ZYLOPRIM) 100 MG tablet Take 1 tablet (100 mg total) by mouth daily. 90 tablet 0  . aspirin 81 MG chewable tablet Chew 81 mg by mouth daily.    . carvedilol (COREG) 12.5 MG tablet Take 0.5 tablets (6.25 mg total) by mouth 2 (two) times daily. 60 tablet 11  . clopidogrel (PLAVIX) 75 MG tablet Take 1 tablet (75 mg total) by mouth at bedtime. 90 tablet 3  . EPINEPHrine 0.3 mg/0.3 mL IJ SOAJ injection Use as directed for life-threatening allergic reaction. (Patient taking differently: Inject 0.3 mg into the muscle as needed for anaphylaxis. Use as directed for life-threatening allergic reaction.) 4 Device 3  . Evolocumab (REPATHA SURECLICK) 174 MG/ML SOAJ Inject 1 Dose into the skin every 14 (fourteen) days. 6 mL 3  . ezetimibe (ZETIA) 10 MG tablet TAKE 1 TABLET BY MOUTH  DAILY (Patient taking differently: Take 10 mg by mouth daily.) 90 tablet 3  . famotidine (PEPCID) 20 MG tablet Take 1 tablet (20 mg total) by mouth  daily. 30 tablet 5  . ferrous sulfate 325 (65 FE) MG tablet Take 325 mg by mouth 2 (two) times daily with a meal.    . hydrALAZINE (APRESOLINE) 25 MG tablet Take 1 tablet (25 mg total) by mouth 3 (three) times daily. 90 tablet 0  . HYDROcodone-acetaminophen (NORCO) 7.5-325 MG tablet Take 1 tablet by mouth 3 (three) times daily as needed for moderate pain. 90 tablet 0  . insulin aspart (NOVOLOG) 100 UNIT/ML injection Inject 2-10 Units into the skin 3 (three) times daily before meals.    . isosorbide mononitrate (IMDUR) 30 MG 24 hr tablet Take 1 tablet (30 mg total) by mouth daily. 30 tablet 0  . latanoprost (XALATAN) 0.005 % ophthalmic solution Place 1 drop into both eyes at bedtime.    Marland Kitchen LEVEMIR FLEXTOUCH 100 UNIT/ML FlexPen Inject 0-50 Units into the skin daily.     Marland Kitchen LORazepam (ATIVAN) 0.5 MG tablet Take 1 tablet (0.5 mg total) by mouth daily as needed for anxiety. 30 tablet 2  . magnesium oxide (MAG-OX) 400 MG tablet Take 2 tablets (800 mg total) by mouth daily. Take 2 (400 mg) daily=800 mg (Patient taking differently: Take 400 mg by mouth daily.) 60 tablet 1  . Omega-3 Fatty Acids (FISH OIL PO) Take 1 capsule by mouth daily.    . OXYGEN Inhale 2 L into the lungs at bedtime.    . Probiotic CAPS Take 1 capsule by mouth daily.    . promethazine (PHENERGAN) 25 MG tablet Take 1 tablet (25 mg total) by mouth every 6 (six) hours as needed for nausea or vomiting. 90 tablet 1  . torsemide (DEMADEX) 20 MG tablet Take 2 tablets (40 mg total) by mouth daily.    . nitroGLYCERIN (NITROSTAT) 0.4 MG SL tablet Place 1 tablet (0.4 mg total) under the tongue every 5 (five) minutes as needed for chest pain. (Patient not taking: Reported on 10/25/2020) 15 tablet 2   No current facility-administered medications for this encounter.    Allergies  Allergen Reactions  . Naproxen Hives and Rash  . Celecoxib Other (See Comments)    Stomach pain  . Aspirin Other (See Comments)    CAN NOT TAKE DUE TO ULCERS (documented previously) but takes every day currently (as of 09/29/20) without reported issues  . Glucosamine Hives, Swelling and Other (See Comments)    Angioedema   . Iron     IV iron transfusion - hives - Feb 2022 hospitalization  . Metoclopramide Other (See Comments)    Facial twitching and stuttering  . Metolazone Other (See Comments)    Acute renal failure  . Shellfish-Derived Products Hives, Swelling and Other (See Comments)    Angioedema   . Tramadol Nausea And Vomiting           Social History   Socioeconomic History  . Marital status: Married    Spouse name: Not on file  . Number of children: Not on file  . Years of education: Not on file  . Highest education level: Not on file  Occupational History  . Not on file  Tobacco Use  . Smoking  status: Former Research scientist (life sciences)  . Smokeless tobacco: Never Used  Vaping Use  . Vaping Use: Never used  Substance and Sexual Activity  . Alcohol use: Not Currently  . Drug use: Not Currently  . Sexual activity: Not on file  Other Topics Concern  . Not on file  Social History Narrative  . Not on file   Social  Determinants of Health   Financial Resource Strain: Not on file  Food Insecurity: No Food Insecurity  . Worried About Charity fundraiser in the Last Year: Never true  . Ran Out of Food in the Last Year: Never true  Transportation Needs: No Transportation Needs  . Lack of Transportation (Medical): No  . Lack of Transportation (Non-Medical): No  Physical Activity: Insufficiently Active  . Days of Exercise per Week: 3 days  . Minutes of Exercise per Session: 40 min  Stress: Not on file  Social Connections: Not on file  Intimate Partner Violence: Not on file      Family History  Problem Relation Age of Onset  . Hypertension Father   . Coronary artery disease Mother   . Hypertension Mother   . Hyperlipidemia Mother   . Diabetes type II Mother   . Breast cancer Mother   . Lung cancer Mother   . Bipolar disorder Other   . Depression Other   . Schizophrenia Other     Vitals:   10/25/20 0903  BP: (!) 126/54  Pulse: 80  SpO2: 100%  Weight: 110.9 kg     PHYSICAL EXAM: General:  Well appearing, obese. No respiratory difficulty HEENT: normal Neck: supple. no JVD. Carotids 2+ bilat; no bruits. No lymphadenopathy or thyromegaly appreciated. Cor: PMI nondisplaced. Regular rate & rhythm. No rubs, gallops or murmurs. Lungs: clear Abdomen: soft, nontender, nondistended. No hepatosplenomegaly. No bruits or masses. Good bowel sounds. Extremities: no cyanosis, clubbing, rash, edema Neuro: alert & oriented x 3, cranial nerves grossly intact. moves all 4 extremities w/o difficulty. Affect pleasant.  ECG: not performed  CardioMEMs: Goal dPAP: 10  Today's dPAP: 5 (volume control  has been good, persistently below goal).    ASSESSMENT & PLAN:  1. CAD w/ Recent NSTEMI - Known coronary disease with prior stents to mid LAD and Prox RCA.  - had recent NSTEMI in February. Cath deferred due to AKI. Treated medically  - readmitted 3/17 w/ CP + EKG changes w /ST depression V4/V5. HS 60>239 compatible with NSTEMI. Underwent LHC and received PCI/DES to mid circ, balloon angioplasty to OM1  - stable w/o further angina  - Continue DAPT w/ ASA + Plavix for a minimum of 12 months - Lipids undergo good control w/ Repatha (LDL 44). Followed by Dr. Debara Pickett   2. CKD Stage IV w/ recent CIN  - Creatinine Peak to 5.5 recent admit 3/17, likely due to CIN post LHC - Improved, SCr down to 2.94 day of d/c>>had f/u BMP post hospital 3/30 and SCr down to 1.81>>BMP at PCP office 4/4 w/ stable SCr 1.81 - followed by nephrology  - no change in BP meds today, keep SBP ~120. Avoid hypotension.  3. Chronic Diastolic Heart Failure - Echo 08/2020 EF 55-60%  - NYHA Class II, Euvolemic on exam and based on CardioMEMS - Continue torsemide 40 mg daily   4. OSA - Continue CPAP nightly.   5. DM2 - recent Hgb A1c 6.8, followed by PCP  - H/o gastroparesis. No further n/v post discharge  6. H/o Epistaxis.  - denies any further events post discharge  7. Chronic Anemia - Baseline Hgb 8-9 range, likely 2/2 renal disease + exacerbated by Epistaxis. - CBC checked by PCP last week, Hgb stable at 8.7. No further bleeding.    F/u w/ Dr. [name] in 2 months.    Lyda Jester, PA-C 10/25/20

## 2020-10-25 NOTE — Patient Instructions (Addendum)
Good to see you!  Follow up Appt with Dr. [name] June 8th at 2:20pm.

## 2020-10-31 DIAGNOSIS — R079 Chest pain, unspecified: Secondary | ICD-10-CM | POA: Diagnosis not present

## 2020-10-31 DIAGNOSIS — I249 Acute ischemic heart disease, unspecified: Secondary | ICD-10-CM | POA: Diagnosis not present

## 2020-10-31 DIAGNOSIS — I5033 Acute on chronic diastolic (congestive) heart failure: Secondary | ICD-10-CM | POA: Diagnosis not present

## 2020-10-31 DIAGNOSIS — I214 Non-ST elevation (NSTEMI) myocardial infarction: Secondary | ICD-10-CM | POA: Diagnosis not present

## 2020-11-01 ENCOUNTER — Other Ambulatory Visit (HOSPITAL_COMMUNITY): Payer: Self-pay | Admitting: Internal Medicine

## 2020-11-01 DIAGNOSIS — Z955 Presence of coronary angioplasty implant and graft: Secondary | ICD-10-CM | POA: Diagnosis not present

## 2020-11-01 DIAGNOSIS — I509 Heart failure, unspecified: Secondary | ICD-10-CM | POA: Diagnosis not present

## 2020-11-01 DIAGNOSIS — I130 Hypertensive heart and chronic kidney disease with heart failure and stage 1 through stage 4 chronic kidney disease, or unspecified chronic kidney disease: Secondary | ICD-10-CM | POA: Diagnosis not present

## 2020-11-01 DIAGNOSIS — E785 Hyperlipidemia, unspecified: Secondary | ICD-10-CM | POA: Diagnosis not present

## 2020-11-01 DIAGNOSIS — N184 Chronic kidney disease, stage 4 (severe): Secondary | ICD-10-CM | POA: Diagnosis not present

## 2020-11-03 DIAGNOSIS — G4733 Obstructive sleep apnea (adult) (pediatric): Secondary | ICD-10-CM | POA: Diagnosis not present

## 2020-11-06 DIAGNOSIS — R079 Chest pain, unspecified: Secondary | ICD-10-CM | POA: Diagnosis not present

## 2020-11-06 DIAGNOSIS — I5033 Acute on chronic diastolic (congestive) heart failure: Secondary | ICD-10-CM | POA: Diagnosis not present

## 2020-11-06 DIAGNOSIS — I249 Acute ischemic heart disease, unspecified: Secondary | ICD-10-CM | POA: Diagnosis not present

## 2020-11-06 DIAGNOSIS — I214 Non-ST elevation (NSTEMI) myocardial infarction: Secondary | ICD-10-CM | POA: Diagnosis not present

## 2020-11-07 DIAGNOSIS — I249 Acute ischemic heart disease, unspecified: Secondary | ICD-10-CM | POA: Diagnosis not present

## 2020-11-07 DIAGNOSIS — I509 Heart failure, unspecified: Secondary | ICD-10-CM | POA: Diagnosis not present

## 2020-11-14 ENCOUNTER — Other Ambulatory Visit: Payer: HMO

## 2020-11-14 DIAGNOSIS — N183 Chronic kidney disease, stage 3 unspecified: Secondary | ICD-10-CM | POA: Diagnosis not present

## 2020-11-14 DIAGNOSIS — I5032 Chronic diastolic (congestive) heart failure: Secondary | ICD-10-CM | POA: Diagnosis not present

## 2020-11-14 DIAGNOSIS — Z955 Presence of coronary angioplasty implant and graft: Secondary | ICD-10-CM | POA: Diagnosis not present

## 2020-11-14 DIAGNOSIS — I130 Hypertensive heart and chronic kidney disease with heart failure and stage 1 through stage 4 chronic kidney disease, or unspecified chronic kidney disease: Secondary | ICD-10-CM | POA: Diagnosis not present

## 2020-11-14 DIAGNOSIS — N184 Chronic kidney disease, stage 4 (severe): Secondary | ICD-10-CM | POA: Diagnosis not present

## 2020-11-14 DIAGNOSIS — I2510 Atherosclerotic heart disease of native coronary artery without angina pectoris: Secondary | ICD-10-CM | POA: Diagnosis not present

## 2020-11-14 DIAGNOSIS — E785 Hyperlipidemia, unspecified: Secondary | ICD-10-CM | POA: Diagnosis not present

## 2020-11-14 DIAGNOSIS — I509 Heart failure, unspecified: Secondary | ICD-10-CM | POA: Diagnosis not present

## 2020-11-15 DIAGNOSIS — D500 Iron deficiency anemia secondary to blood loss (chronic): Secondary | ICD-10-CM | POA: Diagnosis not present

## 2020-11-15 DIAGNOSIS — N1832 Chronic kidney disease, stage 3b: Secondary | ICD-10-CM | POA: Diagnosis not present

## 2020-11-15 LAB — LIPOPROTEIN A (LPA): Lipoprotein (a): <8.4 nmol/L (ref <75.0)

## 2020-11-15 NOTE — Progress Notes (Signed)
Chronic Care Management Pharmacy Note  11/17/2020 Name:  Dana [name] MRN:  [id number] DOB:  Jan 21, 1961   Plan Updates:   Patient wants Dr. Tobie Poet to know Kidney level has improved to 1.6 and sees nephrology tomorrow (11/18/2020).   Patient is completing cardiac rehab and feeling stronger. She has worked with Automotive engineer on improving food choices.   Reports good blood sugar control currently and Repatha covered by grant funding.   Subjective: Dana [name] is an 60 y.o. year old female who is a primary patient of Cox, Kirsten, MD.  The CCM team was consulted for assistance with disease management and care coordination needs.    Engaged with patient by telephone for follow up visit in response to provider referral for pharmacy case management and/or care coordination services.   Consent to Services:  The patient was given the following information about Chronic Care Management services today, agreed to services, and gave verbal consent: 1. CCM service includes personalized support from designated clinical staff supervised by the primary care provider, including individualized plan of care and coordination with other care providers 2. 24/7 contact phone numbers for assistance for urgent and routine care needs. 3. Service will only be billed when office clinical staff spend 20 minutes or more in a month to coordinate care. 4. Only one practitioner may furnish and bill the service in a calendar month. 5.The patient may stop CCM services at any time (effective at the end of the month) by phone call to the office staff. 6. The patient will be responsible for cost sharing (co-pay) of up to 20% of the service fee (after annual deductible is met). Patient agreed to services and consent obtained.  Patient Care Team: [name], MD as PCP - General (Family Medicine) Bensimhon, Shaune Pascal, MD as PCP - Cardiology (Cardiology) Bensimhon, Shaune Pascal, MD as PCP - Advanced Heart Failure (Cardiology) Kennith Gain, MD as Consulting Physician (Allergy) Bensimhon, Shaune Pascal, MD as Consulting Physician (Cardiology) Scherrie November, MD as Referring Physician (Gastroenterology) Adegoroye, Wynona Luna, MD as Consulting Physician (Nephrology) Alanda Slim Neena Rhymes, MD as Consulting Physician (Ophthalmology) Katheran James., MD as Consulting Physician (Endocrinology) Sharyn Dross., DPM as Consulting Physician (Podiatry) Burnice Logan, [facility] as Pharmacist (Pharmacist) Hennie Duos, MD as Consulting Physician (Rheumatology) Biagio Quint, MD as Referring Physician (Nephrology)  Recent office visits: 10/17/2020 - LDL is elevated. 109. Goal should be less than 70 if not less than 55. Please discussed with patient. I also thought she might have been on Repatha or Praluent through the cardiologist.Kidney dysfunction is stable. Sugar is 54.Anemia is stable. This is multifactorial. Labs are concerning for B12 deficiency. I would recommend over-the-counter B12 sublingual 2500 mg once daily.  PTH is elevated which is secondary hyperparathyroidism secondary to kidney dysfunction 09/26/2020 - pursue catherization as kidneys improve. Keep appt with nephroloty. Recent consult visits: 10/25/2020 - cardiology - continue torsemide daily. Continue CPAP nightly.  10/19/2020 - Cardiology - good response to Repatha. Check LP(a) with next lipid panel. No medication changes.  10/13/2020 - nephrology - consider resuming RAAS blocker once creatinine stabilizes for protein in urine.  Hospital visits:   09/29/2020 - ED to hospital admission - ACS and AKI.   Objective:  Lab Results  Component Value Date   CREATININE 1.81 (H) 10/17/2020   BUN 44 (H) 10/17/2020   GFRNONAA 18 (L) 10/06/2020   GFRAA 43 (L) 06/28/2020   NA 143 10/17/2020   K 4.4 10/17/2020  CALCIUM 9.1 10/17/2020   CO2 24 10/17/2020   GLUCOSE 54 (L) 10/17/2020    Lab Results  Component Value Date/Time   HGBA1C 6.8 (H)  09/30/2020 02:41 AM   HGBA1C 6.1 (H) 08/30/2020 12:06 AM    Last diabetic Eye exam:  Lab Results  Component Value Date/Time   HMDIABEYEEXA Retinopathy (A) 07/22/2018 12:00 AM    Last diabetic Foot exam: No results found for: HMDIABFOOTEX   Lab Results  Component Value Date   CHOL 175 10/17/2020   HDL 43 10/17/2020   LDLCALC 109 (H) 10/17/2020   TRIG 130 10/17/2020   CHOLHDL 4.1 10/17/2020    Hepatic Function Latest Ref Rng & Units 10/17/2020 10/12/2020 10/01/2020  Total Protein 6.0 - 8.5 g/dL 7.0 6.8 7.4  Albumin 3.8 - 4.9 g/dL 4.1 3.7(L) 3.0(L)  AST 0 - 40 IU/L 12 14 16   ALT 0 - 32 IU/L 12 10 16   Alk Phosphatase 44 - 121 IU/L 122(H) 114 105  Total Bilirubin 0.0 - 1.2 mg/dL 0.3 0.5 0.9  Bilirubin, Direct 0.0 - 0.2 mg/dL - - 0.2    Lab Results  Component Value Date/Time   TSH 3.590 03/23/2020 11:01 AM    CBC Latest Ref Rng & Units 10/17/2020 10/12/2020 10/06/2020  WBC 3.4 - 10.8 x10E3/uL 8.5 13.3(H) 18.0(H)  Hemoglobin 11.1 - 15.9 g/dL 8.7(L) 8.8(L) 8.4(L)  Hematocrit 34.0 - 46.6 % 27.1(L) 27.2(L) 24.9(L)  Platelets 150 - 450 x10E3/uL 355 389 345    No results found for: VD25OH  Clinical ASCVD: Yes  The ASCVD Risk score Mikey Bussing DC Jr., et al., 2013) failed to calculate for the following reasons:   The patient has a prior MI or stroke diagnosis    Depression screen [facility] 2/9 10/17/2020 09/26/2020 06/28/2020  Decreased Interest 0 0 0  Down, Depressed, Hopeless 0 0 2  PHQ - 2 Score 0 0 2  Altered sleeping 0 1 0  Tired, decreased energy 1 1 2   Change in appetite 0 0 0  Feeling bad or failure about yourself  0 0 0  Trouble concentrating 1 0 0  Moving slowly or fidgety/restless 1 0 0  Suicidal thoughts 0 0 0  PHQ-9 Score 3 2 4   Difficult doing work/chores Not difficult at all - Not difficult at all      Social History   Tobacco Use  Smoking Status Former Smoker  Smokeless Tobacco Never Used   BP Readings from Last 3 Encounters:  10/25/20 (!) 126/54  10/19/20 127/62   10/17/20 136/60   Pulse Readings from Last 3 Encounters:  10/25/20 80  10/19/20 72  10/17/20 72   Wt Readings from Last 3 Encounters:  10/25/20 244 lb 6.4 oz (110.9 kg)  10/19/20 241 lb 3.2 oz (109.4 kg)  10/17/20 243 lb (110.2 kg)   BMI Readings from Last 3 Encounters:  10/25/20 40.67 kg/m  10/19/20 40.14 kg/m  10/17/20 40.44 kg/m    Assessment/Interventions: Review of patient past medical history, allergies, medications, health status, including review of consultants reports, laboratory and other test data, was performed as part of comprehensive evaluation and provision of chronic care management services.   SDOH:  (Social Determinants of Health) assessments and interventions performed: Yes  SDOH Screenings   Alcohol Screen: Not on file  Depression (PHQ2-9): Low Risk   . PHQ-2 Score: 3  Financial Resource Strain: Not on file  Food Insecurity: No Food Insecurity  . Worried About Charity fundraiser in the Last Year: Never true  .  Ran Out of Food in the Last Year: Never true  Housing: Low Risk   . Last Housing Risk Score: 0  Physical Activity: Insufficiently Active  . Days of Exercise per Week: 3 days  . Minutes of Exercise per Session: 40 min  Social Connections: Not on file  Stress: Not on file  Tobacco Use: Medium Risk  . Smoking Tobacco Use: Former Smoker  . Smokeless Tobacco Use: Never Used  Transportation Needs: No Transportation Needs  . Lack of Transportation (Medical): No  . Lack of Transportation (Non-Medical): No    CCM Care Plan  Allergies  Allergen Reactions  . Naproxen Hives and Rash  . Celecoxib Other (See Comments)    Stomach pain  . Aspirin Other (See Comments)    CAN NOT TAKE DUE TO ULCERS (documented previously) but takes every day currently (as of 09/29/20) without reported issues  . Glucosamine Hives, Swelling and Other (See Comments)    Angioedema   . Iron     IV iron transfusion - hives - Feb 2022 hospitalization  . Metoclopramide  Other (See Comments)    Facial twitching and stuttering  . Metolazone Other (See Comments)    Acute renal failure  . Shellfish-Derived Products Hives, Swelling and Other (See Comments)    Angioedema   . Tramadol Nausea And Vomiting         Medications Reviewed Today    Reviewed by Burnice Logan, [facility] (Pharmacist) on 11/17/20 at 1032  Med List Status: <None>  Medication Order Taking? Sig Documenting Provider Last Dose Status Informant  allopurinol (ZYLOPRIM) 100 MG tablet [id number] Yes Take 1 tablet (100 mg total) by mouth daily. [name], MD Taking Active   aspirin 81 MG chewable tablet [id number] Yes Chew 81 mg by mouth daily. [provider] Taking Active Self  carvedilol (COREG) 12.5 MG tablet [id number] Yes Take 0.5 tablets (6.25 mg total) by mouth 2 (two) times daily. Katherine Roan, MD Taking Active   clopidogrel (PLAVIX) 75 MG tablet [id number] Yes Take 1 tablet (75 mg total) by mouth at bedtime. Rafael Bihari, FNP Taking Active Self  Cyanocobalamin 3000 MCG CAPS [id number] Yes Take 2 capsules by mouth daily. Patient purchased 3000 mcg gummies and taking 2 gummies daily [provider] Taking Active   EPINEPHrine 0.3 mg/0.3 mL IJ SOAJ injection [id number] Yes Use as directed for life-threatening allergic reaction.  Patient taking differently: Inject 0.3 mg into the muscle as needed for anaphylaxis. Use as directed for life-threatening allergic reaction.   Kennith Gain, MD Taking Active Self  Evolocumab ([place]) 921 MG/ML Darden Palmer [id number] Yes Inject 1 Dose into the skin every 14 (fourteen) days. Pixie Casino, MD Taking Active   ezetimibe (ZETIA) 10 MG tablet [id number] Yes TAKE 1 TABLET BY MOUTH  DAILY  Patient taking differently: Take 10 mg by mouth daily.   Pixie Casino, MD Taking Active Self  famotidine (PEPCID) 20 MG tablet [id number] Yes Take 1 tablet (20 mg total) by mouth daily. Kennith Gain, MD Taking Active  Self  ferrous sulfate 325 (65 FE) MG tablet [id number] Yes Take 325 mg by mouth 2 (two) times daily with a meal. [provider] Taking Active Self  hydrALAZINE (APRESOLINE) 25 MG tablet [id number] Yes Take 1 tablet (25 mg total) by mouth 3 (three) times daily. Katherine Roan, MD Taking Expired 11/05/20 2359   HYDROcodone-acetaminophen (NORCO) 7.5-325 MG tablet [id number] Yes Take 1 tablet by mouth 3 (three) times daily as  needed for moderate pain. Lillard Anes, MD Taking Active   insulin aspart (NOVOLOG) 100 UNIT/ML injection [id number] Yes Inject 2-10 Units into the skin 3 (three) times daily before meals. [provider] Taking Active Self  isosorbide mononitrate (IMDUR) 30 MG 24 hr tablet [id number] Yes Take 1 tablet (30 mg total) by mouth daily. Katherine Roan, MD Taking Expired 11/05/20 2359   latanoprost (XALATAN) 0.005 % ophthalmic solution [id number] Yes Place 1 drop into both eyes at bedtime. [provider] Taking Active Self  LEVEMIR FLEXTOUCH 100 UNIT/ML FlexPen [id number] Yes Inject 0-50 Units into the skin daily. [provider] Taking Active Self           Med Note (MORA Tsosie Billing A   Thu Sep 29, 2020 10:33 AM) Pt had 50 units this morning  LORazepam (ATIVAN) 0.5 MG tablet [id number]  Take 1 tablet (0.5 mg total) by mouth daily as needed for anxiety. Cox, Kirsten, MD  Active Self  magnesium oxide (MAG-OX) 400 MG tablet [id number]  Take 2 tablets (800 mg total) by mouth daily. Take 2 (400 mg) daily=800 mg  Patient taking differently: Take 400 mg by mouth daily.   Shirley Friar, PA-C  Active   nitroGLYCERIN (NITROSTAT) 0.4 MG SL tablet [id number] Yes Place 1 tablet (0.4 mg total) under the tongue every 5 (five) minutes as needed for chest pain. [place], Maricela Bo, FNP Taking Active            Med Note (JEFFRIES, Sharlot Gowda   Tue Oct 25, 2020  9:05 AM)    Omega-3 Fatty Acids (FISH OIL PO) [id number] Yes Take 1 capsule by mouth  daily. [provider] Taking Active Self  OXYGEN [id number] Yes Inhale 2 L into the lungs at bedtime. [provider] Taking Active Self  Probiotic CAPS [id number] Yes Take 1 capsule by mouth daily. [provider] Taking Active Self  promethazine (PHENERGAN) 25 MG tablet [id number] Yes Take 1 tablet (25 mg total) by mouth every 6 (six) hours as needed for nausea or vomiting. Cox, Kirsten, MD Taking Active Self  torsemide (DEMADEX) 20 MG tablet [id number] Yes Take 2 tablets (40 mg total) by mouth daily. Katherine Roan, MD Taking Active   Med List Note Jaymes Graff 03/04/18 1416): CPAP          Patient Active Problem List   Diagnosis Date Noted  . Chest pain 09/29/2020  . ACS (acute coronary syndrome) ([place])   . Non-ST elevation (NSTEMI) myocardial infarction ([place]) 09/03/2020  . Contrast dye induced nephropathy, possible 09/03/2020  . OSA (obstructive sleep apnea) 09/03/2020  . Type 2 diabetes mellitus with complication, without long-term current use of insulin ([place]) 09/03/2020  . Acute on chronic diastolic heart failure ([place]) 08/29/2020  . Need for COVID-19 vaccine 06/28/2020  . Medicare annual wellness visit, subsequent 06/13/2020  . Chronic respiratory failure with hypoxia ([place]) 03/23/2020  . Chronic gout without tophus 03/23/2020  . Pain in both hands 03/23/2020  . Uncomplicated opioid dependence ([place]) 03/23/2020  . Depression, major, recurrent, mild ([place]) 12/21/2019  . Hypomagnesemia 12/21/2019  . Diarrhea 12/21/2019  . Lumbar back pain 10/05/2019  . Diabetic nephropathy associated with type 2 diabetes mellitus ([place]) 10/05/2019  . Hypertensive heart and renal disease with congestive heart failure ([place]) 09/18/2019  . Demand ischemia ([place])   . Diastolic heart failure ([place]) 11/07/2017  . Morbid obesity with BMI of 45.0-49.9, adult ([place]) 11/07/2017  . Coronary artery disease involving native coronary  artery of native heart without angina  pectoris 12/25/2016  . Hyperlipidemia LDL goal <70 12/14/2016  . Anemia 03/23/2016  . Chronic pain syndrome 03/23/2016  . COPD (chronic obstructive pulmonary disease) ([place]) 03/23/2016  . GERD without esophagitis 03/23/2016  . On home oxygen therapy 03/23/2016  . S/P ablation of atrial fibrillation 10/04/2015  . Diabetic polyneuropathy associated with type 2 diabetes mellitus ([place]) 04/02/2014    Immunization History  Administered Date(s) Administered  . Influenza Inj Mdck Quad Pf 03/23/2020  . Influenza, Quadrivalent, Recombinant, Inj, Pf 05/21/2018  . Influenza,inj,Quad PF,6+ Mos 03/28/2015  . Influenza-Unspecified 04/15/2014, 05/16/2016, 03/12/2019  . Moderna SARS-COV2 Booster Vaccination 06/28/2020  . Pneumococcal Conjugate-13 05/16/2014, 06/14/2014  . Pneumococcal Polysaccharide-23 05/18/2018    Conditions to be addressed/monitored:  Hyperlipidemia, Diabetes, Heart Failure and Coronary Artery Disease  Care Plan : CCM Pharmacy Care Plan  Updates made by Burnice Logan, RPH since 11/17/2020 12:00 AM    Problem: chf, hld, dm   Priority: High  Onset Date: 11/17/2020    Long-Range Goal: Disease State Management   Start Date: 11/17/2020  Expected End Date: 11/17/2021  This Visit's Progress: On track  Priority: High  Note:     Current Barriers:  . Unable to maintain control of diabetes  Pharmacist Clinical Goal(s):  Marland Kitchen Patient will maintain control of diabetes as evidenced by a1c and avoiding low blood sugar readings  through collaboration with PharmD and provider.   Interventions: . 1:1 collaboration with [name], MD regarding development and update of comprehensive plan of care as evidenced by provider attestation and co-signature . Inter-disciplinary care team collaboration (see longitudinal plan of care) . Comprehensive medication review performed; medication list updated in electronic medical record  Hyperlipidemia: (LDL goal < 55) -Not ideally controlled -Current  treatment: . Repatha sureclick every 2 weeks -Medications previously tried: pravastatin  -Current dietary patterns: working on [employer] with dietician through insurance -Current exercise habits: cardiac rehab. Hopes to join silver sneakers at the [facility] once completing cardiac rehab.  -Educated on Cholesterol goals;  Importance of limiting foods high in cholesterol; Exercise goal of 150 minutes per week; -Counseled on diet and exercise extensively Recommended to continue current medication  Diabetes (A1c goal <7%) -Controlled -Current medications: . Levemir 50 units daily  . Novolog 2-10 units tid before meals  -Medications previously tried:  farxiga -Current home glucose readings . fasting glucose: 99-105  . post prandial glucose: well controlled only low is when she gets busy and eats too late after her meal-time insulin -Reports hypoglycemic/hyperglycemic symptoms - 2 times in the past few weeks when she got busy and didn't eat as quickly -Current meal patterns:  . breakfast:  Watching diet and improving choices for heart healthy, lower sodium and lower blood sugar . lunch:  not overly hungry at lunch time but does try to eat  . dinner: avoiding fried foods, more fish (2-3 times weekly), baked chicken, utilizing portion control, not eating pizza since heart attack . snacks: 1/2 peanut butter sandwich, has cut back  . drinks:  has cut out diet coke -Current exercise: cardiac rehab -Educated on A1c and blood sugar goals; Exercise goal of 150 minutes per week; Prevention and management of hypoglycemic episodes; Carbohydrate counting and/or plate method -Counseled to check feet daily and get yearly eye exams -Counseled on diet and exercise extensively Recommended to continue current medication. Patient started 3000 mcg daily of vitamin b-12 2 gummies daily. Working with dietician and has been losing some weight.  Heart Failure (Goal: manage symptoms and prevent  exacerbations) -Controlled -Last ejection fraction: 55-60% (Date: 09/30/2020) -HF type: Diastolic -NYHA Class: II (slight limitation of activity) -Current treatment: . Torsemide 20 mg 2 tablets daily  . Carvedilol 12.5 mg bid  . Hydralazine 25 mg tid  . Isosorbide mn 30 mg daily  -Medications previously tried:  Metolazone, spironolactone,  -Current home BP/HR readings:  ~120/60 mmHg -Current dietary habits:  watching sodium  -Current exercise habits:  cardiac rehab currently  -Educated on Benefits of medications for managing symptoms and prolonging life Importance of blood pressure control -Counseled on diet and exercise extensively Recommended to continue current medication. Wearing support stockings every day.    Patient Goals/Self-Care Activities . Patient will:  - take medications as prescribed focus on medication adherence by using pill box  check glucose 3-4 times daily, document, and provide at future appointments target a minimum of 150 minutes of moderate intensity exercise weekly engage in dietary modifications by limiting carbohydrates, sodium and fatty foods  Follow Up Plan: Telephone follow up appointment with care management team member scheduled for: 05/2021      Medication Assistance: Patient has grant to cover cost of [place].   Patient's preferred pharmacy is:  [facility] [address] [place] [address] [place] [address] Phone: [phone] Fax: [place], [place], [address] [place], [address] [place] [address] Phone: [phone] Fax: [phone]  Uses pill box? Yes Pt endorses good compliance  We discussed: Current pharmacy is preferred with insurance plan and patient is satisfied with pharmacy services Patient decided to: Continue current medication management strategy  Care Plan and Follow Up Patient Decision:  Patient agrees to Care  Plan and Follow-up.  Plan: Telephone follow up appointment with care management team member scheduled for:  05/25/2021

## 2020-11-16 DIAGNOSIS — G4733 Obstructive sleep apnea (adult) (pediatric): Secondary | ICD-10-CM | POA: Diagnosis not present

## 2020-11-16 DIAGNOSIS — J301 Allergic rhinitis due to pollen: Secondary | ICD-10-CM | POA: Diagnosis not present

## 2020-11-16 DIAGNOSIS — J4520 Mild intermittent asthma, uncomplicated: Secondary | ICD-10-CM | POA: Diagnosis not present

## 2020-11-17 ENCOUNTER — Other Ambulatory Visit: Payer: Self-pay

## 2020-11-17 ENCOUNTER — Ambulatory Visit (INDEPENDENT_AMBULATORY_CARE_PROVIDER_SITE_OTHER): Payer: HMO

## 2020-11-17 DIAGNOSIS — E782 Mixed hyperlipidemia: Secondary | ICD-10-CM

## 2020-11-17 DIAGNOSIS — E1142 Type 2 diabetes mellitus with diabetic polyneuropathy: Secondary | ICD-10-CM

## 2020-11-17 DIAGNOSIS — I130 Hypertensive heart and chronic kidney disease with heart failure and stage 1 through stage 4 chronic kidney disease, or unspecified chronic kidney disease: Secondary | ICD-10-CM | POA: Diagnosis not present

## 2020-11-17 NOTE — Patient Instructions (Addendum)
Visit Information  Goals Addressed            This Visit's Progress   . Learn More About My Health       Timeframe:  Long-Range Goal Priority:  High Start Date:                             Expected End Date:                        Follow Up Date 05/2021   - tell my story and reason for my visit - make a list of questions - ask questions - repeat what I heard to make sure I understand - bring a list of my medicines to the visit - speak up when I don't understand    Why is this important?    The best way to learn about your health and care is by talking to the doctor and nurse.   They will answer your questions and give you information in the way that you like best.    Notes:     Marland Kitchen Manage My Medicine       Timeframe:  Long-Range Goal Priority:  High Start Date:                             Expected End Date:                       Follow Up Date 05/25/2021    - call for medicine refill 2 or 3 days before it runs out - keep a list of all the medicines I take; vitamins and herbals too - use a pillbox to sort medicine    Why is this important?   . These steps will help you keep on track with your medicines.   Notes:     . Track and Manage Symptoms-Heart Failure       Timeframe:  Long-Range Goal Priority:  High Start Date:                             Expected End Date:                       Follow Up Date 05/25/2021    - develop a rescue plan - eat more whole grains, fruits and vegetables, lean meats and healthy fats - know when to call the doctor - track symptoms and what helps feel better or worse    Why is this important?    You will be able to handle your symptoms better if you keep track of them.   Making some simple changes to your lifestyle will help.   Eating healthy is one thing you can do to take good care of yourself.    Notes:       Patient Care Plan: CCM Pharmacy Care Plan    Problem Identified: chf, hld, dm   Priority: High  Onset  Date: 11/17/2020    Long-Range Goal: Disease State Management   Start Date: 11/17/2020  Expected End Date: 11/17/2021  This Visit's Progress: On track  Priority: High  Note:     Current Barriers:  . Unable to maintain control of diabetes  Pharmacist Clinical Goal(s):  Marland Kitchen Patient will maintain control of diabetes as  evidenced by a1c and avoiding low blood sugar readings  through collaboration with PharmD and provider.   Interventions: . 1:1 collaboration with [name], MD regarding development and update of comprehensive plan of care as evidenced by provider attestation and co-signature . Inter-disciplinary care team collaboration (see longitudinal plan of care) . Comprehensive medication review performed; medication list updated in electronic medical record  Hyperlipidemia: (LDL goal < 55) -Not ideally controlled -Current treatment: . Repatha sureclick every 2 weeks -Medications previously tried: pravastatin  -Current dietary patterns: working on [employer] with dietician through insurance -Current exercise habits: cardiac rehab. Hopes to join silver sneakers at the [facility] once completing cardiac rehab.  -Educated on Cholesterol goals;  Importance of limiting foods high in cholesterol; Exercise goal of 150 minutes per week; -Counseled on diet and exercise extensively Recommended to continue current medication  Diabetes (A1c goal <7%) -Controlled -Current medications: . Levemir 50 units daily  . Novolog 2-10 units tid before meals  -Medications previously tried:  farxiga -Current home glucose readings . fasting glucose: 99-105  . post prandial glucose: well controlled only low is when she gets busy and eats too late after her meal-time insulin -Reports hypoglycemic/hyperglycemic symptoms - 2 times in the past few weeks when she got busy and didn't eat as quickly -Current meal patterns:  . breakfast:  Watching diet and improving choices for heart healthy, lower sodium and  lower blood sugar . lunch:  not overly hungry at lunch time but does try to eat  . dinner: avoiding fried foods, more fish (2-3 times weekly), baked chicken, utilizing portion control, not eating pizza since heart attack . snacks: 1/2 peanut butter sandwich, has cut back  . drinks:  has cut out diet coke -Current exercise: cardiac rehab -Educated on A1c and blood sugar goals; Exercise goal of 150 minutes per week; Prevention and management of hypoglycemic episodes; Carbohydrate counting and/or plate method -Counseled to check feet daily and get yearly eye exams -Counseled on diet and exercise extensively Recommended to continue current medication. Patient started 3000 mcg daily of vitamin b-12 2 gummies daily. Working with dietician and has been losing some weight.   Heart Failure (Goal: manage symptoms and prevent exacerbations) -Controlled -Last ejection fraction: 55-60% (Date: 09/30/2020) -HF type: Diastolic -NYHA Class: II (slight limitation of activity) -Current treatment: . Torsemide 20 mg 2 tablets daily  . Carvedilol 12.5 mg bid  . Hydralazine 25 mg tid  . Isosorbide mn 30 mg daily  -Medications previously tried:  Metolazone, spironolactone,  -Current home BP/HR readings:  ~120/60 mmHg -Current dietary habits:  watching sodium  -Current exercise habits:  cardiac rehab currently  -Educated on Benefits of medications for managing symptoms and prolonging life Importance of blood pressure control -Counseled on diet and exercise extensively Recommended to continue current medication. Wearing support stockings every day.    Patient Goals/Self-Care Activities . Patient will:  - take medications as prescribed focus on medication adherence by using pill box  check glucose 3-4 times daily, document, and provide at future appointments target a minimum of 150 minutes of moderate intensity exercise weekly engage in dietary modifications by limiting carbohydrates, sodium and fatty  foods  Follow Up Plan: Telephone follow up appointment with care management team member scheduled for: 05/2021      Patient verbalizes understanding of instructions provided today and agrees to view in [place].  Telephone follow up appointment with pharmacy team member scheduled for: 05/25/2021  Burnice Logan, [facility]      Vitamin  B12 Deficiency Vitamin B12 deficiency means that your body does not have enough vitamin B12. The body needs this vitamin:  To make red blood cells.  To make genes (DNA).  To help the nerves work. If you do not have enough vitamin B12 in your body, you can have health problems. What are the causes?  Not eating enough foods that contain vitamin B12.  Not being able to absorb vitamin B12 from the food that you eat.  Certain digestive system diseases.  A condition in which the body does not make enough of a certain protein, which results in too few red blood cells (pernicious anemia).  Having a surgery in which part of the stomach or small intestine is removed.  Taking medicines that make it hard for the body to absorb vitamin B12. These medicines include: ? Heartburn medicines. ? Some antibiotic medicines. ? Other medicines that are used to treat certain conditions. What increases the risk?  Being older than age 76.  Eating a vegetarian or vegan diet, especially while you are pregnant.  Eating a poor diet while you are pregnant.  Taking certain medicines.  Having alcoholism. What are the signs or symptoms? In some cases, there are no symptoms. If the condition leads to too few blood cells or nerve damage, symptoms can occur, such as:  Feeling weak.  Feeling tired (fatigued).  Not being hungry.  Weight loss.  A loss of feeling (numbness) or tingling in your hands and feet.  Redness and burning of the tongue.  Being mixed up (confused) or having memory problems.  Sadness (depression).  Problems with your senses. This can include  color blindness, ringing in the ears, or loss of taste.  Watery poop (diarrhea) or trouble pooping (constipation).  Trouble walking. If anemia is very bad, symptoms can include:  Being short of breath.  Being dizzy.  Having a very fast heartbeat. How is this treated?  Changing the way you eat and drink, such as: ? Eating more foods that contain vitamin B12. ? Drinking little or no alcohol.  Getting vitamin B12 shots.  Taking vitamin B12 supplements. Your doctor will tell you the dose that is best for you. Follow these instructions at home: Eating and drinking  Eat lots of healthy foods that contain vitamin B12. These include: ? Meats and poultry, such as beef, pork, chicken, [place], and organ meats, such as liver. ? Seafood, such as clams, rainbow trout, salmon, tuna, and haddock. ? Eggs. ? Cereal and dairy products that have vitamin B12 added to them. Check the label. The items listed above may not be a complete list of what you can eat and drink. Contact a dietitian for more options.   General instructions  Get any shots as told by your doctor.  Take supplements only as told by your doctor.  Do not drink alcohol if your doctor tells you not to. In some cases, you may only be asked to limit alcohol use.  Keep all follow-up visits as told by your doctor. This is important. Contact a doctor if:  Your symptoms come back. Get help right away if:  You have trouble breathing.  You have a very fast heartbeat.  You have chest pain.  You get dizzy.  You pass out. Summary  Vitamin B12 deficiency means that your body is not getting enough vitamin B12.  In some cases, there are no symptoms of this condition.  Treatment may include making a change in the way you eat and drink,  getting vitamin B12 shots, or taking supplements.  Eat lots of healthy foods that contain vitamin B12. This information is not intended to replace advice given to you by your health care  provider. Make sure you discuss any questions you have with your health care provider. Document Revised: 03/11/2018 Document Reviewed: 03/11/2018 Elsevier Patient Education  2021 [employer].

## 2020-11-18 DIAGNOSIS — N2581 Secondary hyperparathyroidism of renal origin: Secondary | ICD-10-CM | POA: Insufficient documentation

## 2020-11-18 DIAGNOSIS — Z6841 Body Mass Index (BMI) 40.0 and over, adult: Secondary | ICD-10-CM | POA: Diagnosis not present

## 2020-11-18 DIAGNOSIS — I5032 Chronic diastolic (congestive) heart failure: Secondary | ICD-10-CM | POA: Diagnosis not present

## 2020-11-18 DIAGNOSIS — D631 Anemia in chronic kidney disease: Secondary | ICD-10-CM | POA: Diagnosis not present

## 2020-11-18 DIAGNOSIS — I130 Hypertensive heart and chronic kidney disease with heart failure and stage 1 through stage 4 chronic kidney disease, or unspecified chronic kidney disease: Secondary | ICD-10-CM | POA: Diagnosis not present

## 2020-11-18 DIAGNOSIS — E1022 Type 1 diabetes mellitus with diabetic chronic kidney disease: Secondary | ICD-10-CM | POA: Diagnosis not present

## 2020-11-18 DIAGNOSIS — E559 Vitamin D deficiency, unspecified: Secondary | ICD-10-CM | POA: Diagnosis not present

## 2020-11-18 DIAGNOSIS — R809 Proteinuria, unspecified: Secondary | ICD-10-CM | POA: Diagnosis not present

## 2020-11-18 DIAGNOSIS — N1832 Chronic kidney disease, stage 3b: Secondary | ICD-10-CM | POA: Diagnosis not present

## 2020-11-18 DIAGNOSIS — Z87891 Personal history of nicotine dependence: Secondary | ICD-10-CM | POA: Diagnosis not present

## 2020-11-18 DIAGNOSIS — Z794 Long term (current) use of insulin: Secondary | ICD-10-CM | POA: Diagnosis not present

## 2020-11-24 DIAGNOSIS — D631 Anemia in chronic kidney disease: Secondary | ICD-10-CM | POA: Diagnosis not present

## 2020-11-24 DIAGNOSIS — N1832 Chronic kidney disease, stage 3b: Secondary | ICD-10-CM | POA: Diagnosis not present

## 2020-11-28 ENCOUNTER — Other Ambulatory Visit: Payer: Self-pay

## 2020-11-28 MED ORDER — HYDROCODONE-ACETAMINOPHEN 7.5-325 MG PO TABS: 1.0000 | ORAL_TABLET | Freq: Three times a day (TID) | ORAL | 0 refills | Status: DC | PRN

## 2020-11-28 MED ORDER — LORAZEPAM 0.5 MG PO TABS: 0.5000 mg | ORAL_TABLET | Freq: Every day | ORAL | 2 refills | Status: DC | PRN

## 2020-11-30 DIAGNOSIS — D631 Anemia in chronic kidney disease: Secondary | ICD-10-CM | POA: Diagnosis not present

## 2020-11-30 DIAGNOSIS — I5033 Acute on chronic diastolic (congestive) heart failure: Secondary | ICD-10-CM | POA: Diagnosis not present

## 2020-11-30 DIAGNOSIS — I214 Non-ST elevation (NSTEMI) myocardial infarction: Secondary | ICD-10-CM | POA: Diagnosis not present

## 2020-11-30 DIAGNOSIS — R079 Chest pain, unspecified: Secondary | ICD-10-CM | POA: Diagnosis not present

## 2020-11-30 DIAGNOSIS — I249 Acute ischemic heart disease, unspecified: Secondary | ICD-10-CM | POA: Diagnosis not present

## 2020-11-30 DIAGNOSIS — N1832 Chronic kidney disease, stage 3b: Secondary | ICD-10-CM | POA: Diagnosis not present

## 2020-12-07 ENCOUNTER — Encounter: Payer: Self-pay | Admitting: Nurse Practitioner

## 2020-12-07 ENCOUNTER — Other Ambulatory Visit: Payer: Self-pay

## 2020-12-07 ENCOUNTER — Ambulatory Visit (INDEPENDENT_AMBULATORY_CARE_PROVIDER_SITE_OTHER): Payer: HMO | Admitting: Nurse Practitioner

## 2020-12-07 VITALS — BP 134/62 | HR 88 | Temp 97.4°F | Ht 65.0 in | Wt 242.0 lb

## 2020-12-07 DIAGNOSIS — R202 Paresthesia of skin: Secondary | ICD-10-CM | POA: Diagnosis not present

## 2020-12-07 DIAGNOSIS — I249 Acute ischemic heart disease, unspecified: Secondary | ICD-10-CM | POA: Diagnosis not present

## 2020-12-07 DIAGNOSIS — M25531 Pain in right wrist: Secondary | ICD-10-CM

## 2020-12-07 DIAGNOSIS — R200 Anesthesia of skin: Secondary | ICD-10-CM | POA: Diagnosis not present

## 2020-12-07 DIAGNOSIS — I509 Heart failure, unspecified: Secondary | ICD-10-CM | POA: Diagnosis not present

## 2020-12-07 NOTE — Patient Instructions (Addendum)
Wrist Pain, Adult There are many things that can cause wrist pain. Some common causes include:  An injury to the wrist.  Using the joint too much.  A condition that causes too much pressure to be put on a nerve in the wrist (carpal tunnel syndrome).  Wear and tear of the joints that happens as a person gets older (osteoarthritis).  A condition that causes swelling and stiffness in the joints (arthritis). Sometimes, the cause of wrist pain is not known. Often, the pain goes away when you follow your doctor's instructions for easing pain at home. This may include resting your wrist, icing your wrist, or using a splint or an elastic wrap for a short time. It is important to tell your doctor if your wrist pain does not go away. Follow these instructions at home: If you have a splint or elastic wrap:  Wear the splint or wrap as told by your doctor. Take it off only as told by your doctor. Ask if you can take it off for bathing.  Loosen the splint or wrap if your fingers: ? Tingle. ? Become numb. ? Turn cold and blue.  Check the skin around the splint or wrap every day. Tell your doctor about any concerns.  Keep the splint or wrap clean.  If the splint or wrap is not waterproof: ? Do not let it get wet. ? Cover it with a watertight covering when you take a bath or shower. Managing pain, stiffness, and swelling  If told, put ice on the painful area. To do this: ? If you have a removable splint or wrap, take it off as told by your doctor. ? Put ice in a plastic bag. ? Place a towel between your skin and the bag or between your splint or wrap and the bag. ? Leave the ice on for 20 minutes, 2-3 times a day.  Move your fingers often.  Raise (elevate) the injured area above the level of your heart while you are sitting or lying down.   Activity  Rest your wrist as told by your doctor.  Return to your normal activities as told by your doctor. Ask your doctor what activities are  safe for you.  Ask your doctor when it is safe to drive if you have a splint or wrap on your wrist.  Do exercises as told by your doctor. General instructions  Pay attention to any changes in your symptoms.  Take over-the-counter and prescription medicines only as told by your doctor.  Keep all follow-up visits as told by your doctor. This is important. Contact a doctor if:  You have a sudden, sharp pain in the wrist, hand, or arm that is different or new.  The swelling or bruising on your wrist or hand gets worse.  Your skin: ? Becomes red. ? Gets a rash. ? Has open sores.  Your pain does not get better.  Your pain gets worse.  You have a fever or chills. Get help right away if:  You lose feeling in your fingers or hand.  Your fingers turn white, very red, or cold and blue.  You cannot move your fingers. Summary  There are many things that can cause wrist pain.  It is important to tell your doctor if your wrist pain does not go away.  You may need to wear a splint or a wrap for a short period of time.  Return to your normal activities as told by your doctor. Ask your  doctor what activities are safe for you. This information is not intended to replace advice given to you by your health care provider. Make sure you discuss any questions you have with your health care provider. Document Revised: 05/21/2019 Document Reviewed: 05/21/2019 Elsevier Patient Education  2021 [place]. Wrist Splint or Brace, Adult A wrist splint or brace holds your wrist in position so it does not move. A splint or brace provides support for the wrist, but a brace is less stiff than a splint and is often used for a longer time. You can take off a splint or brace or make it loose. You may need a wrist splint or brace if you hurt your wrist or have swelling in your wrist. What are the risks?  Reduced blood flow. This can happen if the splint or brace is too tight or if you have a lot of  swelling. Lack of blood flow can cause a condition called compartment syndrome. Symptoms of this condition include: ? Pain in your wrist that gets worse. ? Tingling. ? Having no feeling in your wrist or hand (numbness). ? Pale or blue skin. ? Cold fingers.  There are other risks, such as: ? A stiff wrist. ? A weak wrist. ? Itching, rash, sores, or infection. How to wear your wrist splint or brace Your splint or brace should be tight enough to support your wrist. But it should not be too tight because it can block the flow of blood to your wrist. Your doctor will tell you how to wear your splint or brace and how long to wear it.  Wear the splint or brace as told by your doctor. Only take it off as told by your doctor.  Loosen the splint or brace if your fingers tingle, get numb, or turn cold and blue.  Do not stick anything inside the splint or brace to scratch your skin.  Check the skin around the splint or brace every day. Tell your doctor if you see problems.  Keep the splint or brace clean.  If the splint or brace is not waterproof: ? Do not let it get wet. ? Cover it with a watertight covering when you take a bath or a shower.   General recommendations  Do not put pressure on any part of the splint until it is fully hardened.  Clean your splint or brace regularly. Make sure it is dry before you put it on. To clean and care for your splint or brace: ? Follow directions from your doctor. ? Read the cleaning instructions that came with the splint or brace. Follow these instructions at home: Managing pain, stiffness, and swelling   If told, put ice on the injured area. ? If you a have a splint or brace that can be taken off, take it off as told by your doctor. ? Put ice in a plastic bag. ? Place a towel between your skin and the bag. ? Leave the ice on for 20 minutes, 2-3 times a day. ? Take off the ice if your skin turns bright red. This is very important. If you cannot  feel pain, heat, or cold, you have a greater risk of damage to the area.  Move your fingers often to avoid stiffness and to lessen swelling.  Raise the injured area above the level of your heart while you are sitting or lying down.   Activity  Do exercises as told by your doctor.  Ask your doctor when it is safe  to drive with a splint or brace on your wrist.  Return to your normal activities as told by your doctor. Ask your doctor what activities are safe for you. General instructions  Do not use the injured hand to do heavy work, such as lifting, pulling or pushing.  Do not smoke or use any products that contain nicotine or tobacco. If you need help quitting, ask your doctor.  Take over-the-counter and prescription medicines only as told by your doctor.  Keep all follow-up visits. Get help if:  You have wrist pain or swelling that does not go away.  The skin around or under your splint or brace gets red, itchy, or moist.  You have chills or fever.  Your splint or brace feels too tight or too loose.  Your splint or brace breaks. Get help right away if:  You have pain that gets worse.  You have tingling and numbness.  You have changes in skin color, including paleness or a bluish color.  Your fingers are cold. Summary  A wrist splint or brace is a device that supports your wrist and keeps it from moving.  Wear your splint or brace as told by your doctor. This helps your wrist to heal correctly.  Use ice on your wrist. Also, move your fingers often and raise your wrist above the level of your heart when you sit or lie down.  Your splint or brace should be tight enough to support your wrist. Do not make it too tight.  Get help right away if your fingers tingle, get numb, or turn cold and blue. Loosen the splint or brace right away. This information is not intended to replace advice given to you by your health care provider. Make sure you discuss any questions you have  with your health care provider. Document Revised: 11/05/2019 Document Reviewed: 11/05/2019 Elsevier Patient Education  2021 [employer].

## 2020-12-07 NOTE — Progress Notes (Signed)
Established Patient Office Visit  Subjective:  Patient ID: Dana [name], female    DOB: [birth year]  Age: 60 y.o. MRN: [id number]  CC:  Chief Complaint  Patient presents with  . Right hand/wrist pain    Pain/Numbness    HPI [name] presents for evaluation of right hand pain with numbness and tingling. She states her right fifth finger is "drifting away" form her other four fingers. She states she can force fifth finger to touch her ring finger when she "cups" her hand. She has difficulty making a fist and carrying objects.Onset was March 2022 after having attempted right radial cardiac cath. Treatment has included ice, heat, Tylenol, and Hydrocodone (has chronic pain). States pain and numbness are interfering with ADLs such as cooking and opening bottles/jars. She is right hand dominant. Right hand pain is awakening her during the night, she has dropped hot cups of coffee,and she has difficulty writing. 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 She has not had that job in 65 years.  Bay recently was found to have B12 and Vit D deficiency. She is currently on Vit B12 and Vit D replacement. She has neuropathy to bilateral lower extremities.   Past Medical History:  Diagnosis Date  . Acquired absence of left great toe ([place])   . Acquired absence of other left toe(s) ([place])   . Anginal pain ([place])   . Atherosclerotic heart disease of native coronary artery without angina pectoris   . CAD (coronary artery disease)    08/2016 DES to RCA, also with history of LAD stenting.   . Chronic diastolic (congestive) heart failure ([place])   . Chronic kidney disease, stage 2 (mild)   . Chronic systolic (congestive) heart failure ([place])   . Contrast dye induced nephropathy, possible 09/03/2020  . Diabetes ([place])   . Diastolic CHF ([place])   . Gastro-esophageal reflux disease without esophagitis   . Hyperlipidemia   . Hypertension   .  Hypertensive heart disease with heart failure ([place])   . Hypoxemia   . Idiopathic gout, multiple sites   . Localized edema   . Low back pain   . Mixed hyperlipidemia   . Mixed incontinence   . Morbid (severe) obesity due to excess calories ([place])   . Non-ST elevation (NSTEMI) myocardial infarction ([place]) 09/03/2020  . OSA (obstructive sleep apnea)   . OSA (obstructive sleep apnea) 09/03/2020  . Other specified hypothyroidism   . Primary insomnia   . Type 2 diabetes mellitus with complication, without long-term current use of insulin ([place]) 09/03/2020  . Type 2 diabetes mellitus with diabetic nephropathy [facility])     Past Surgical History:  Procedure Laterality Date  . ABDOMINAL HYSTERECTOMY     Partial- Still has both ovaries  . CARDIOVASCULAR STRESS TEST  08/13/2014   Nuclear; Normal  . CARPAL TUNNEL RELEASE    . CATARACT EXTRACTION, BILATERAL    . CESAREAN SECTION    . CHOLECYSTECTOMY    . CORONARY ANGIOPLASTY WITH STENT PLACEMENT     3 blockages/ 1 stent  . CORONARY STENT INTERVENTION N/A 09/29/2020   Procedure: CORONARY STENT INTERVENTION;  Surgeon: Sherren Mocha, MD;  Location: [facility];  Service: Cardiovascular;  Laterality: N/A;  CFX  . EYE SURGERY    . LEFT HEART CATH AND CORONARY ANGIOGRAPHY N/A 09/29/2020   Procedure: LEFT HEART CATH AND CORONARY ANGIOGRAPHY;  Surgeon: Sherren Mocha, MD;  Location: [facility];  Service: Cardiovascular;  Laterality: N/A;  . RIGHT/LEFT HEART CATH AND CORONARY ANGIOGRAPHY N/A 01/07/2017   Procedure: Right/Left Heart Cath and Coronary Angiography;  Surgeon: Larey Dresser, MD;  Location: [facility];  Service: Cardiovascular;  Laterality: N/A;  . ROTATOR CUFF REPAIR    . TOE AMPUTATION Left   . US ECHOCARDIOGRAPHY  05/2017   Normal    Family History  Problem Relation Age of Onset  . Hypertension Father   . Coronary artery disease Mother   . Hypertension Mother   . Hyperlipidemia Mother   . Diabetes type II  Mother   . Breast cancer Mother   . Lung cancer Mother   . Bipolar disorder Other   . Depression Other   . Schizophrenia Other     Social History   Socioeconomic History  . Marital status: Married    Spouse name: Not on file  . Number of children: Not on file  . Years of education: Not on file  . Highest education level: Not on file  Occupational History  . Not on file  Tobacco Use  . Smoking status: Former Research scientist (life sciences)  . Smokeless tobacco: Never Used  Vaping Use  . Vaping Use: Never used  Substance and Sexual Activity  . Alcohol use: Not Currently  . Drug use: Not Currently  . Sexual activity: Not on file  Other Topics Concern  . Not on file  Social History Narrative  . Not on file   Social Determinants of Health   Financial Resource Strain: Not on file  Food Insecurity: No Food Insecurity  . Worried About Charity fundraiser in the Last Year: Never true  . Ran Out of Food in the Last Year: Never true  Transportation Needs: No Transportation Needs  . Lack of Transportation (Medical): No  . Lack of Transportation (Non-Medical): No  Physical Activity: Insufficiently Active  . Days of Exercise per Week: 3 days  . Minutes of Exercise per Session: 40 min  Stress: Not on file  Social Connections: Not on file  Intimate Partner Violence: Not on file    Outpatient Medications Prior to Visit  Medication Sig Dispense Refill  . allopurinol (ZYLOPRIM) 100 MG tablet Take 1 tablet (100 mg total) by mouth daily. 90 tablet 0  . aspirin 81 MG chewable tablet Chew 81 mg by mouth daily.    . BD PEN NEEDLE NANO 2ND GEN 32G X 4 MM MISC 4 (four) times daily.    . carvedilol (COREG) 12.5 MG tablet Take 0.5 tablets (6.25 mg total) by mouth 2 (two) times daily. 60 tablet 11  . clopidogrel (PLAVIX) 75 MG tablet Take 1 tablet (75 mg total) by mouth at bedtime. 90 tablet 3  . Cyanocobalamin 3000 MCG CAPS Take 2 capsules by mouth daily. Patient purchased 3000 mcg gummies and taking 2 gummies  daily    . EPINEPHrine 0.3 mg/0.3 mL IJ SOAJ injection Use as directed for life-threatening allergic reaction. (Patient taking differently: Inject 0.3 mg into the muscle as needed for anaphylaxis. Use as directed for life-threatening allergic reaction.) 4 Device 3  . epoetin alfa-epbx (RETACRIT) 70017 UNIT/ML injection Inject into the skin.    Marland Kitchen ergocalciferol (VITAMIN D2) 1.25 MG (50000 UT) capsule Take by mouth.    . Evolocumab (REPATHA SURECLICK) 494 MG/ML SOAJ Inject 1 Dose into the skin every 14 (fourteen) days. 6 mL 3  . ezetimibe (ZETIA) 10 MG tablet TAKE 1 TABLET BY MOUTH  DAILY (Patient  taking differently: Take 10 mg by mouth daily.) 90 tablet 3  . famotidine (PEPCID) 20 MG tablet Take 1 tablet (20 mg total) by mouth daily. 30 tablet 5  . ferrous sulfate 325 (65 FE) MG tablet Take 325 mg by mouth 2 (two) times daily with a meal.    . HYDROcodone-acetaminophen (NORCO) 7.5-325 MG tablet Take 1 tablet by mouth 3 (three) times daily as needed for moderate pain. 90 tablet 0  . insulin aspart (NOVOLOG) 100 UNIT/ML injection Inject 2-10 Units into the skin 3 (three) times daily before meals.    . latanoprost (XALATAN) 0.005 % ophthalmic solution Place 1 drop into both eyes at bedtime.    Marland Kitchen LEVEMIR FLEXTOUCH 100 UNIT/ML FlexPen Inject 0-50 Units into the skin daily.    Marland Kitchen LORazepam (ATIVAN) 0.5 MG tablet Take 1 tablet (0.5 mg total) by mouth daily as needed for anxiety. 30 tablet 2  . magnesium oxide (MAG-OX) 400 MG tablet Take 2 tablets (800 mg total) by mouth daily. Take 2 (400 mg) daily=800 mg (Patient taking differently: Take 400 mg by mouth daily.) 60 tablet 1  . nitroGLYCERIN (NITROSTAT) 0.4 MG SL tablet Place 1 tablet (0.4 mg total) under the tongue every 5 (five) minutes as needed for chest pain. 15 tablet 2  . Omega-3 Fatty Acids (FISH OIL PO) Take 1 capsule by mouth daily.    . OXYGEN Inhale 2 L into the lungs at bedtime.    . Probiotic CAPS Take 1 capsule by mouth daily.    .  promethazine (PHENERGAN) 25 MG tablet Take 1 tablet (25 mg total) by mouth every 6 (six) hours as needed for nausea or vomiting. 90 tablet 1  . torsemide (DEMADEX) 20 MG tablet Take 2 tablets (40 mg total) by mouth daily.    . hydrALAZINE (APRESOLINE) 25 MG tablet Take 1 tablet (25 mg total) by mouth 3 (three) times daily. 90 tablet 0  . isosorbide mononitrate (IMDUR) 30 MG 24 hr tablet Take 1 tablet (30 mg total) by mouth daily. 30 tablet 0   No facility-administered medications prior to visit.    Allergies  Allergen Reactions  . Naproxen Hives and Rash  . Celecoxib Other (See Comments)    Stomach pain  . Aspirin Other (See Comments)    CAN NOT TAKE DUE TO ULCERS (documented previously) but takes every day currently (as of 09/29/20) without reported issues  . Glucosamine Hives, Swelling and Other (See Comments)    Angioedema   . Iron     IV iron transfusion - hives - Feb 2022 hospitalization  . Metoclopramide Other (See Comments)    Facial twitching and stuttering  . Metolazone Other (See Comments)    Acute renal failure  . Shellfish-Derived Products Hives, Swelling and Other (See Comments)    Angioedema   . Tramadol Nausea And Vomiting         ROS Review of Systems  Constitutional: Negative for fatigue and fever.  HENT: Negative for congestion, ear pain, sinus pressure and sore throat.   Eyes: Negative for pain.  Respiratory: Negative for cough, chest tightness, shortness of breath and wheezing.   Cardiovascular: Negative for chest pain and palpitations.  Gastrointestinal: Negative for abdominal pain, constipation, diarrhea, nausea and vomiting.  Genitourinary: Negative for dysuria and hematuria.  Musculoskeletal: Positive for arthralgias, back pain (chronic) and myalgias (Right hand Pain/Numbness). Negative for joint swelling.  Skin: Negative for rash.  Neurological: Positive for numbness (right hand and forearm). Negative for dizziness, weakness and  headaches.        Phantom pain left foot from two toe amputations; Has neuropathy bilateral lower legs and feet   Psychiatric/Behavioral: Negative for dysphoric mood. The patient is not nervous/anxious.       Objective:    Physical Exam Vitals reviewed.  Constitutional:      Appearance: Normal appearance. She is well-developed.  HENT:     Head: Normocephalic.  Pulmonary:     Effort: Pulmonary effort is normal.     Breath sounds: Normal breath sounds.  Musculoskeletal:        General: Tenderness (right hand, wrist) present.  Skin:    General: Skin is warm and dry.     Capillary Refill: Capillary refill takes less than 2 seconds.  Neurological:     General: No focal deficit present.     Mental Status: She is alert and oriented to person, place, and time.  Psychiatric:        Mood and Affect: Mood normal.        Behavior: Behavior normal.     BP 134/62 (BP Location: Left Arm, Patient Position: Sitting)   Pulse 88   Temp (!) 97.4 F (36.3 C) (Temporal)   Ht 5' 5"  (1.651 m)   Wt 242 lb (109.8 kg)   SpO2 98%   BMI 40.27 kg/m  Wt Readings from Last 3 Encounters:  12/07/20 242 lb (109.8 kg)  10/25/20 244 lb 6.4 oz (110.9 kg)  10/19/20 241 lb 3.2 oz (109.4 kg)     Health Maintenance Due  Topic Date Due  . URINE MICROALBUMIN  Never done  . TETANUS/TDAP  Never done  . MAMMOGRAM  06/24/2020  . COVID-19 Vaccine (2 - Moderna 3-dose series) 07/26/2020    Lab Results  Component Value Date   TSH 3.590 03/23/2020   Lab Results  Component Value Date   WBC 8.5 10/17/2020   HGB 8.7 (L) 10/17/2020   HCT 27.1 (L) 10/17/2020   MCV 86 10/17/2020   PLT 355 10/17/2020   Lab Results  Component Value Date   NA 143 10/17/2020   K 4.4 10/17/2020   CO2 24 10/17/2020   GLUCOSE 54 (L) 10/17/2020   BUN 44 (H) 10/17/2020   CREATININE 1.81 (H) 10/17/2020   BILITOT 0.3 10/17/2020   ALKPHOS 122 (H) 10/17/2020   AST 12 10/17/2020   ALT 12 10/17/2020   PROT 7.0 10/17/2020   ALBUMIN 4.1 10/17/2020    CALCIUM 9.1 10/17/2020   ANIONGAP 9 10/06/2020   EGFR 32 (L) 10/17/2020   Lab Results  Component Value Date   CHOL 175 10/17/2020   Lab Results  Component Value Date   HDL 43 10/17/2020   Lab Results  Component Value Date   LDLCALC 109 (H) 10/17/2020   Lab Results  Component Value Date   TRIG 130 10/17/2020   Lab Results  Component Value Date   CHOLHDL 4.1 10/17/2020   Lab Results  Component Value Date   HGBA1C 6.8 (H) 09/30/2020      Assessment & Plan:   1. Right wrist pain - Ambulatory referral to Orthopedic Surgery -Right wrist brace during daytime hours -Tylenol as directed for pain -Alternate ice/heat to right wrist  2. Numbness and tingling of right hand - Ambulatory referral to Orthopedic Surgery    Wear right wrist splint Continue rest, ice, and Tylenol as needed We will call you with orthopedic referral appointment  Follow-up as needed   Follow-up: As needed    I,  Fonnie Mu as a scribe for Rip Harbour, NP.,have documented all relevant documentation on the behalf of Rip Harbour, NP,as directed by  Rip Harbour, NP while in the presence of Rip Harbour, NP.  Oleta Mouse, CMA   I, Rip Harbour, NP, have reviewed all documentation for this visit. The documentation on 12/07/20 for the exam, diagnosis, procedures, and orders are all accurate and complete.   Signed,  Jerrell Belfast, DNP

## 2020-12-11 ENCOUNTER — Other Ambulatory Visit (HOSPITAL_COMMUNITY): Payer: Self-pay | Admitting: Internal Medicine

## 2020-12-14 DIAGNOSIS — I509 Heart failure, unspecified: Secondary | ICD-10-CM | POA: Diagnosis not present

## 2020-12-14 DIAGNOSIS — N184 Chronic kidney disease, stage 4 (severe): Secondary | ICD-10-CM | POA: Diagnosis not present

## 2020-12-14 DIAGNOSIS — I130 Hypertensive heart and chronic kidney disease with heart failure and stage 1 through stage 4 chronic kidney disease, or unspecified chronic kidney disease: Secondary | ICD-10-CM | POA: Diagnosis not present

## 2020-12-14 DIAGNOSIS — E785 Hyperlipidemia, unspecified: Secondary | ICD-10-CM | POA: Diagnosis not present

## 2020-12-14 DIAGNOSIS — Z955 Presence of coronary angioplasty implant and graft: Secondary | ICD-10-CM | POA: Diagnosis not present

## 2020-12-15 ENCOUNTER — Ambulatory Visit (INDEPENDENT_AMBULATORY_CARE_PROVIDER_SITE_OTHER): Payer: HMO

## 2020-12-15 ENCOUNTER — Ambulatory Visit: Payer: HMO | Admitting: Orthopaedic Surgery

## 2020-12-15 ENCOUNTER — Encounter: Payer: Self-pay | Admitting: Orthopaedic Surgery

## 2020-12-15 DIAGNOSIS — M25531 Pain in right wrist: Secondary | ICD-10-CM | POA: Diagnosis not present

## 2020-12-15 DIAGNOSIS — R202 Paresthesia of skin: Secondary | ICD-10-CM

## 2020-12-15 NOTE — Progress Notes (Signed)
Office Visit Note   Patient: Dana [name]           Date of Birth: [birth year]           MRN: [id number] Visit Date: 12/15/2020              Requested by: Rip Harbour, NP [address] [place],  [place] [address] PCP: [name], MD   Assessment & Plan: Visit Diagnoses:  1. Right hand paresthesia   2. Pain in right wrist     Plan: Impression is right hand pain and paresthesias with an abnormal presentation of questionable carpal tunnel syndrome.  At this point, we will order nerve conduction study/EMG right upper extremity to assess for nerve compression.  She will follow-up with [name] once that has been completed.  We have also made the referral to rheumatology.  Call with concerns or questions in the meantime.  Follow-Up Instructions: Return for after NCS/EMG RUE.   Orders:  Orders Placed This Encounter  Procedures  . XR Wrist Complete Right   No orders of the defined types were placed in this encounter.     Procedures: No procedures performed   Clinical Data: No additional findings.   Subjective: Chief Complaint  Patient presents with  . Right Wrist - Pain    HPI pleasant 60 year old right-hand-dominant female who comes in today with ulnar-sided right wrist pain since March.  This began following a heart catheterization.  She notes that during the catheterization she noticed significant pain to the wrist where the procedure had to be stopped.  She has had associated weakness as well.  She has been getting paresthesias to the entire hand from the tip of the fingers to the volar forearm.  There are no specific aggravators although she does note she wakes up at night shaking her hands.  She has been using ice and heat and taking an occasional Tylenol without significant relief.  She does note a history of left carpal tunnel and cubital tunnel release a few decades ago.  Doing well on the left side.  Of note, she says she has been worked up for rheumatoid  arthritis in the past with positive inflammatory markers.  She was referred to rheumatologist where she was told there was nothing that could be done due to her current allergies and medication list.  She would like a new rheumatology referral today.  Review of Systems as detailed in HPI.  All others reviewed and are negative.   Objective: Vital Signs: There were no vitals taken for this visit.  Physical Exam well-developed well-nourished female in no acute distress.  Alert and oriented x3.  Ortho Exam right wrist exam shows mild tenderness to the distal ulna.  She has increased pain with radial deviation and wrist flexion.  No pain with ulnar deviation or wrist extension.  No pain with supination or pronation.  She has no pain loading the TFCC.  Negative Phalen and negative Tinel at the wrist.  Negative Tinel at the elbow.  Slight decrease sensation to the index finger compared to the left side.  Specialty Comments:  No specialty comments available.  Imaging: No results found.   PMFS History: Patient Active Problem List   Diagnosis Date Noted  . Secondary hyperparathyroidism of renal origin ([place]) 11/18/2020  . Vitamin D deficiency 11/18/2020  . Proteinuria 10/13/2020  . Stage 3b chronic kidney disease ([place]) 10/13/2020  . Type 1 diabetes mellitus with stage 3b chronic kidney disease (  [place]) 10/13/2020  . Chest pain 09/29/2020  . ACS (acute coronary syndrome) ([place])   . Non-ST elevation (NSTEMI) myocardial infarction ([place]) 09/03/2020  . Contrast dye induced nephropathy, possible 09/03/2020  . OSA (obstructive sleep apnea) 09/03/2020  . Type 2 diabetes mellitus with complication, without long-term current use of insulin ([place]) 09/03/2020  . Acute on chronic diastolic heart failure ([place]) 08/29/2020  . Need for COVID-19 vaccine 06/28/2020  . Medicare annual wellness visit, subsequent 06/13/2020  . Chronic respiratory failure with hypoxia ([place]) 03/23/2020  . Chronic gout without tophus  03/23/2020  . Pain in both hands 03/23/2020  . Uncomplicated opioid dependence ([place]) 03/23/2020  . Depression, major, recurrent, mild ([place]) 12/21/2019  . Hypomagnesemia 12/21/2019  . Diarrhea 12/21/2019  . Lumbar back pain 10/05/2019  . Diabetic nephropathy associated with type 2 diabetes mellitus ([place]) 10/05/2019  . Hypertensive heart and renal disease with congestive heart failure ([place]) 09/18/2019  . Demand ischemia ([place])   . Diastolic heart failure ([place]) 11/07/2017  . Morbid obesity with BMI of 45.0-49.9, adult ([place]) 11/07/2017  . Coronary artery disease involving native coronary artery of native heart without angina pectoris 12/25/2016  . Hyperlipidemia LDL goal <70 12/14/2016  . Anemia 03/23/2016  . Chronic pain syndrome 03/23/2016  . COPD (chronic obstructive pulmonary disease) ([place]) 03/23/2016  . GERD without esophagitis 03/23/2016  . On home oxygen therapy 03/23/2016  . S/P ablation of atrial fibrillation 10/04/2015  . Diabetic polyneuropathy associated with type 2 diabetes mellitus ([place]) 04/02/2014   Past Medical History:  Diagnosis Date  . Acquired absence of left great toe ([place])   . Acquired absence of other left toe(s) ([place])   . Anginal pain ([place])   . Atherosclerotic heart disease of native coronary artery without angina pectoris   . CAD (coronary artery disease)    08/2016 DES to RCA, also with history of LAD stenting.   . Chronic diastolic (congestive) heart failure ([place])   . Chronic kidney disease, stage 2 (mild)   . Chronic systolic (congestive) heart failure ([place])   . Contrast dye induced nephropathy, possible 09/03/2020  . Diabetes ([place])   . Diastolic CHF ([place])   . Gastro-esophageal reflux disease without esophagitis   . Hyperlipidemia   . Hypertension   . Hypertensive heart disease with heart failure ([place])   . Hypoxemia   . Idiopathic gout, multiple sites   . Localized edema   . Low back pain   . Mixed hyperlipidemia   . Mixed incontinence   . Morbid  (severe) obesity due to excess calories ([place])   . Non-ST elevation (NSTEMI) myocardial infarction ([place]) 09/03/2020  . OSA (obstructive sleep apnea)   . OSA (obstructive sleep apnea) 09/03/2020  . Other specified hypothyroidism   . Primary insomnia   . Type 2 diabetes mellitus with complication, without long-term current use of insulin ([place]) 09/03/2020  . Type 2 diabetes mellitus with diabetic nephropathy ([facility])     Family History  Problem Relation Age of Onset  . Hypertension Father   . Coronary artery disease Mother   . Hypertension Mother   . Hyperlipidemia Mother   . Diabetes type II Mother   . Breast cancer Mother   . Lung cancer Mother   . Bipolar disorder Other   . Depression Other   . Schizophrenia Other     Past Surgical History:  Procedure Laterality Date  . ABDOMINAL HYSTERECTOMY     Partial- Still has both ovaries  . CARDIOVASCULAR STRESS TEST  08/13/2014  Nuclear; Normal  . CARPAL TUNNEL RELEASE    . CATARACT EXTRACTION, BILATERAL    . CESAREAN SECTION    . CHOLECYSTECTOMY    . CORONARY ANGIOPLASTY WITH STENT PLACEMENT     3 blockages/ 1 stent  . CORONARY STENT INTERVENTION N/A 09/29/2020   Procedure: CORONARY STENT INTERVENTION;  Surgeon: Sherren Mocha, MD;  Location: [facility];  Service: Cardiovascular;  Laterality: N/A;  CFX  . EYE SURGERY    . LEFT HEART CATH AND CORONARY ANGIOGRAPHY N/A 09/29/2020   Procedure: LEFT HEART CATH AND CORONARY ANGIOGRAPHY;  Surgeon: Sherren Mocha, MD;  Location: [facility];  Service: Cardiovascular;  Laterality: N/A;  . RIGHT/LEFT HEART CATH AND CORONARY ANGIOGRAPHY N/A 01/07/2017   Procedure: Right/Left Heart Cath and Coronary Angiography;  Surgeon: Larey Dresser, MD;  Location: [facility];  Service: Cardiovascular;  Laterality: N/A;  . ROTATOR CUFF REPAIR    . TOE AMPUTATION Left   . US ECHOCARDIOGRAPHY  05/2017   Normal   Social History   Occupational History  . Not on file  Tobacco Use  .  Smoking status: Former Research scientist (life sciences)  . Smokeless tobacco: Never Used  Vaping Use  . Vaping Use: Never used  Substance and Sexual Activity  . Alcohol use: Not Currently  . Drug use: Not Currently  . Sexual activity: Not on file

## 2020-12-16 ENCOUNTER — Other Ambulatory Visit: Payer: Self-pay

## 2020-12-16 DIAGNOSIS — M79641 Pain in right hand: Secondary | ICD-10-CM

## 2020-12-16 DIAGNOSIS — M79642 Pain in left hand: Secondary | ICD-10-CM

## 2020-12-21 ENCOUNTER — Encounter (HOSPITAL_COMMUNITY): Payer: HMO | Admitting: Internal Medicine

## 2020-12-22 DIAGNOSIS — N1832 Chronic kidney disease, stage 3b: Secondary | ICD-10-CM | POA: Diagnosis not present

## 2020-12-22 DIAGNOSIS — I129 Hypertensive chronic kidney disease with stage 1 through stage 4 chronic kidney disease, or unspecified chronic kidney disease: Secondary | ICD-10-CM | POA: Diagnosis not present

## 2020-12-22 DIAGNOSIS — Z794 Long term (current) use of insulin: Secondary | ICD-10-CM | POA: Diagnosis not present

## 2020-12-22 DIAGNOSIS — E1021 Type 1 diabetes mellitus with diabetic nephropathy: Secondary | ICD-10-CM | POA: Diagnosis not present

## 2020-12-22 DIAGNOSIS — D631 Anemia in chronic kidney disease: Secondary | ICD-10-CM | POA: Diagnosis not present

## 2020-12-22 DIAGNOSIS — E1042 Type 1 diabetes mellitus with diabetic polyneuropathy: Secondary | ICD-10-CM | POA: Diagnosis not present

## 2020-12-22 DIAGNOSIS — E782 Mixed hyperlipidemia: Secondary | ICD-10-CM | POA: Diagnosis not present

## 2020-12-26 ENCOUNTER — Telehealth: Payer: Self-pay | Admitting: Orthopaedic Surgery

## 2020-12-26 ENCOUNTER — Other Ambulatory Visit: Payer: Self-pay

## 2020-12-26 DIAGNOSIS — R202 Paresthesia of skin: Secondary | ICD-10-CM

## 2020-12-26 DIAGNOSIS — M79641 Pain in right hand: Secondary | ICD-10-CM

## 2020-12-26 NOTE — Telephone Encounter (Signed)
Pt called and was wondering if she is going to have a referral sent to Dr.Newton? She states she is suppose to be having a nerve conduction study.   CB [phone]

## 2020-12-29 ENCOUNTER — Other Ambulatory Visit: Payer: Self-pay

## 2020-12-29 DIAGNOSIS — Z87891 Personal history of nicotine dependence: Secondary | ICD-10-CM | POA: Diagnosis not present

## 2020-12-29 DIAGNOSIS — E1065 Type 1 diabetes mellitus with hyperglycemia: Secondary | ICD-10-CM | POA: Diagnosis not present

## 2020-12-29 DIAGNOSIS — Z794 Long term (current) use of insulin: Secondary | ICD-10-CM | POA: Diagnosis not present

## 2020-12-29 DIAGNOSIS — I5032 Chronic diastolic (congestive) heart failure: Secondary | ICD-10-CM | POA: Diagnosis not present

## 2020-12-29 DIAGNOSIS — I130 Hypertensive heart and chronic kidney disease with heart failure and stage 1 through stage 4 chronic kidney disease, or unspecified chronic kidney disease: Secondary | ICD-10-CM | POA: Diagnosis not present

## 2020-12-29 DIAGNOSIS — E1021 Type 1 diabetes mellitus with diabetic nephropathy: Secondary | ICD-10-CM | POA: Diagnosis not present

## 2020-12-29 DIAGNOSIS — D631 Anemia in chronic kidney disease: Secondary | ICD-10-CM | POA: Diagnosis not present

## 2020-12-29 DIAGNOSIS — N1832 Chronic kidney disease, stage 3b: Secondary | ICD-10-CM | POA: Diagnosis not present

## 2020-12-29 DIAGNOSIS — N2581 Secondary hyperparathyroidism of renal origin: Secondary | ICD-10-CM | POA: Diagnosis not present

## 2020-12-29 DIAGNOSIS — E559 Vitamin D deficiency, unspecified: Secondary | ICD-10-CM | POA: Diagnosis not present

## 2020-12-29 MED ORDER — HYDROCODONE-ACETAMINOPHEN 7.5-325 MG PO TABS: 1.0000 | ORAL_TABLET | Freq: Three times a day (TID) | ORAL | 0 refills | Status: DC | PRN

## 2020-12-31 DIAGNOSIS — R079 Chest pain, unspecified: Secondary | ICD-10-CM | POA: Diagnosis not present

## 2020-12-31 DIAGNOSIS — I249 Acute ischemic heart disease, unspecified: Secondary | ICD-10-CM | POA: Diagnosis not present

## 2020-12-31 DIAGNOSIS — I5033 Acute on chronic diastolic (congestive) heart failure: Secondary | ICD-10-CM | POA: Diagnosis not present

## 2020-12-31 DIAGNOSIS — I214 Non-ST elevation (NSTEMI) myocardial infarction: Secondary | ICD-10-CM | POA: Diagnosis not present

## 2021-01-03 DIAGNOSIS — G720 Drug-induced myopathy: Secondary | ICD-10-CM | POA: Insufficient documentation

## 2021-01-03 NOTE — Addendum Note (Signed)
Encounter addended by: Leeroy Bock, RPH-CPP on: 01/03/2021 3:23 PM  Actions taken: Visit diagnoses modified, Problem List modified

## 2021-01-04 ENCOUNTER — Ambulatory Visit (HOSPITAL_COMMUNITY)
Admission: RE | Admit: 2021-01-04 | Discharge: 2021-01-04 | Disposition: A | Discharge status: Home / Self Care | Payer: HMO | Source: Ambulatory Visit | Attending: Internal Medicine | Admitting: Internal Medicine

## 2021-01-04 ENCOUNTER — Encounter (HOSPITAL_COMMUNITY): Payer: Self-pay | Admitting: Internal Medicine

## 2021-01-04 ENCOUNTER — Other Ambulatory Visit: Payer: Self-pay

## 2021-01-04 VITALS — BP 128/62 | HR 86 | Wt 240.0 lb

## 2021-01-04 DIAGNOSIS — R040 Epistaxis: Secondary | ICD-10-CM | POA: Insufficient documentation

## 2021-01-04 DIAGNOSIS — Z8249 Family history of ischemic heart disease and other diseases of the circulatory system: Secondary | ICD-10-CM | POA: Diagnosis not present

## 2021-01-04 DIAGNOSIS — I130 Hypertensive heart and chronic kidney disease with heart failure and stage 1 through stage 4 chronic kidney disease, or unspecified chronic kidney disease: Secondary | ICD-10-CM | POA: Insufficient documentation

## 2021-01-04 DIAGNOSIS — N179 Acute kidney failure, unspecified: Secondary | ICD-10-CM | POA: Insufficient documentation

## 2021-01-04 DIAGNOSIS — I2510 Atherosclerotic heart disease of native coronary artery without angina pectoris: Secondary | ICD-10-CM | POA: Diagnosis not present

## 2021-01-04 DIAGNOSIS — E782 Mixed hyperlipidemia: Secondary | ICD-10-CM | POA: Insufficient documentation

## 2021-01-04 DIAGNOSIS — I252 Old myocardial infarction: Secondary | ICD-10-CM | POA: Diagnosis not present

## 2021-01-04 DIAGNOSIS — G4733 Obstructive sleep apnea (adult) (pediatric): Secondary | ICD-10-CM | POA: Insufficient documentation

## 2021-01-04 DIAGNOSIS — I5032 Chronic diastolic (congestive) heart failure: Secondary | ICD-10-CM | POA: Diagnosis not present

## 2021-01-04 DIAGNOSIS — E785 Hyperlipidemia, unspecified: Secondary | ICD-10-CM

## 2021-01-04 DIAGNOSIS — Z794 Long term (current) use of insulin: Secondary | ICD-10-CM | POA: Insufficient documentation

## 2021-01-04 DIAGNOSIS — E1022 Type 1 diabetes mellitus with diabetic chronic kidney disease: Secondary | ICD-10-CM | POA: Insufficient documentation

## 2021-01-04 DIAGNOSIS — N184 Chronic kidney disease, stage 4 (severe): Secondary | ICD-10-CM | POA: Diagnosis not present

## 2021-01-04 DIAGNOSIS — I5033 Acute on chronic diastolic (congestive) heart failure: Secondary | ICD-10-CM | POA: Diagnosis not present

## 2021-01-04 DIAGNOSIS — Z7902 Long term (current) use of antithrombotics/antiplatelets: Secondary | ICD-10-CM | POA: Diagnosis not present

## 2021-01-04 DIAGNOSIS — Z7982 Long term (current) use of aspirin: Secondary | ICD-10-CM | POA: Diagnosis not present

## 2021-01-04 DIAGNOSIS — Z79899 Other long term (current) drug therapy: Secondary | ICD-10-CM | POA: Insufficient documentation

## 2021-01-04 DIAGNOSIS — R000 Tachycardia, unspecified: Secondary | ICD-10-CM | POA: Diagnosis not present

## 2021-01-04 DIAGNOSIS — R002 Palpitations: Secondary | ICD-10-CM | POA: Insufficient documentation

## 2021-01-04 DIAGNOSIS — E1043 Type 1 diabetes mellitus with diabetic autonomic (poly)neuropathy: Secondary | ICD-10-CM | POA: Insufficient documentation

## 2021-01-04 MED ORDER — TORSEMIDE 20 MG PO TABS
20.0000 mg | ORAL_TABLET | ORAL | 3 refills | Status: DC
Start: 2021-01-04 — End: 2021-03-09

## 2021-01-04 MED ORDER — DAPAGLIFLOZIN PROPANEDIOL 10 MG PO TABS
10.0000 mg | ORAL_TABLET | Freq: Every day | ORAL | 5 refills | Status: DC
Start: 2021-01-04 — End: 2021-03-15

## 2021-01-04 NOTE — Progress Notes (Addendum)
Advanced Heart Failure Clinic Note   Referring Physician: PCP: [name], MD PCP-Cardiologist: [name], MD   Reason for Visit: [facility] F/u s/p NSTEMI s/p PCI + Chronic Diastolic Heart Falilure  HPI:  Dana [name] is 60 year old with history of diastolic heart failure, CAD, DES RCA/LAD 2018, DMI, htn, hyperlipidemia, PAD, and OSA   Admitted 08/29/20 with chest pain and increased shortness of breath. Had CTA that negative for PE. HS Trop 41>66. This was initially thought to be from demand ischemia. Had chest pain again with HS Trop up to  1917. Placed on heparin drip + Ntg drip. Also diuresed with IV lasix. Cath was not pursued with creatinine 3.4. Hospital course complicated by AKI and anemia so cath was not pursued.   09/29/20 Patient woke up during the middle the night with sharp chest pain and shortness of breath with exertion.  She presented with ST depressions in V4 and V5 and high-sensitivity troponin that increased from 60>239 and a chest x-ray consistent with pulmonary edema.  Additionally she had a leukocytosis on admission which was felt to be reactive in the setting of NSTEMI.   She was started on nitro and heparin drip with improvement in symptoms.  She was taken for left heart cath:   1. 3 vessel CAD with moderate mid-LAD and mid-RCA stenoses, and severe mid-LCx stenosis 2. Successful PCI of the mid-circumflex, reducing severe 90% stenosis to 0% with a 2.75x22 mm Resolute Onyx DES, provisional side branch angioplasty of the OM1 with a 2.5 mm balloon through the stent struts 3. Moderately elevated LVEDP     After her left heart catheterization with PCI her chest pain symptoms resolved completely.  Unfortunately she developed a contrast-induced nephropathy and her creatinine went from 2.2 up to a peak of 5.5.  She remained volume overloaded at this time.  Additionally, she developed severe nausea and vomiting, gastroparesis and required IV antiplatelet therapy.  While  on this therapy she developed epistaxis and acute blood loss anemia requiring transfusion.  She was given promotility agents given her history of gastroparesis her nausea and vomiting improved.  Epistaxis was treated with nasal Afrin.  She was switched back to oral DAPT. During this time diuretics were reintroduced and her creatinine began to improve over the following days.  5.5>>4.5>>3.5>2.9.  Her volume status improved significantly, she no longer had bleeding, remained chest pain free and had no further nausea and vomiting.  Her leukocytosis persisted but all her infectious workup was negative and she was afebrile throughout admission and this was felt to be reactive.  She was discharged on 3/24. She had f/u BMP, post discharge, done on 3/20. Creatine had further improved, down from  2.94>>1.81.  Last visit was post hospital f/u 10/2020 Doing well. Had f/u w/ her PCP, Dr. Tobie Poet, last week and had repeat labs. SCr remained stable at 1.81. Hgb stable at 8.7. She denies CP. No dyspnea. Wt has been stable at home. Regularly monitors BP at home, SBPs in the 120s. No hypotension. Compliant w/ meds. Volume status has been good. She has a CardioMEMs. CardioMEMs: Goal dPAP: 10  Today's dPAP: 5 (volume control has been good, persistently below goal).   Has been purposely losing weight.  Cr 1.82 last week at nephrology appointment.  Feeling good, still doing cardiac rehab and on her off days still using the stationary bike.  Doing weights and yoga.  Denies dyspnea with exertion, has some trouble on really hot days when exerting herself outside.  In the air conditioning no longer has trouble going up and down stairs, can walk unlimited distance.  Orthopnea has improved, she has more stamina.  She does endorse some episodes of tachycardia and palpitations at night while just sitting in her chair watching TV. Denies associated dyspnea, or discomfort.  dPAP looks excellent averaging around 15mmHg.     Echo 09/30/20 1. Left  ventricular ejection fraction, by estimation, is 55 to 60%. The left ventricle has normal function. Left ventricular endocardial border not optimally defined to evaluate regional wall motion, despite the use of Definity contrast. There is mild left ventricular hypertrophy. Left ventricular diastolic parameters are consistent with Grade II diastolic dysfunction (pseudonormalization). 2. Right ventricular systolic function is normal. The right ventricular size is mildly enlarged. There is normal pulmonary artery systolic pressure. The estimated right ventricular systolic pressure is 73.2 mmHg. 3. Left atrial size was mildly dilated. 4. Right atrial size was mildly dilated. 5. The mitral valve was not well visualized. Trivial mitral valve regurgitation. 6. The aortic valve was not well visualized. Aortic valve regurgitation is not visualized. No aortic stenosis is present. 7. The inferior vena cava is normal in size with <50% respiratory variability, suggesting right atrial pressure of 8 mmHg.   LHC 09/29/20  1. 3 vessel CAD with moderate mid-LAD and mid-RCA stenoses, and severe mid-LCx stenosis 2. Successful PCI of the mid-circumflex, reducing severe 90% stenosis to 0% with a 2.75x22 mm Resolute Onyx DES, provisional side branch angioplasty of the OM1 with a 2.5 mm balloon through the stent struts    Review of systems complete and found to be negative unless listed in HPI.     Past Medical History:  Diagnosis Date   Acquired absence of left great toe [facility])    Acquired absence of other left toe(s) (HCC)    Anginal pain ([place])    Atherosclerotic heart disease of native coronary artery without angina pectoris    CAD (coronary artery disease)    08/2016 DES to RCA, also with history of LAD stenting.    Chronic diastolic (congestive) heart failure (HCC)    Chronic kidney disease, stage 2 (mild)    Chronic systolic (congestive) heart failure (HCC)    Contrast dye induced nephropathy,  possible 09/03/2020   Diabetes (HCC)    Diastolic CHF (HCC)    Gastro-esophageal reflux disease without esophagitis    Hyperlipidemia    Hypertension    Hypertensive heart disease with heart failure (HCC)    Hypoxemia    Idiopathic gout, multiple sites    Localized edema    Low back pain    Mixed hyperlipidemia    Mixed incontinence    Morbid (severe) obesity due to excess calories (HCC)    Non-ST elevation (NSTEMI) myocardial infarction ([place]) 09/03/2020   OSA (obstructive sleep apnea)    OSA (obstructive sleep apnea) 09/03/2020   Other specified hypothyroidism    Primary insomnia    Type 2 diabetes mellitus with complication, without long-term current use of insulin ([place]) 09/03/2020   Type 2 diabetes mellitus with diabetic nephropathy (HCC)     Current Outpatient Medications  Medication Sig Dispense Refill   allopurinol (ZYLOPRIM) 100 MG tablet Take 1 tablet (100 mg total) by mouth daily. 90 tablet 0   aspirin 81 MG chewable tablet Chew 81 mg by mouth daily.     BD PEN NEEDLE NANO 2ND GEN 32G X 4 MM MISC 4 (four) times daily.     carvedilol (COREG) 12.5 MG tablet Take  0.5 tablets (6.25 mg total) by mouth 2 (two) times daily. 60 tablet 11   clopidogrel (PLAVIX) 75 MG tablet Take 1 tablet (75 mg total) by mouth at bedtime. 90 tablet 3   Cyanocobalamin 3000 MCG CAPS Take 2 capsules by mouth daily. Patient purchased 3000 mcg gummies and taking 2 gummies daily     EPINEPHrine 0.3 mg/0.3 mL IJ SOAJ injection Use as directed for life-threatening allergic reaction. 4 Device 3   epoetin alfa-epbx (RETACRIT) 43329 UNIT/ML injection Inject into the skin.     ergocalciferol (VITAMIN D2) 1.25 MG (50000 UT) capsule Take by mouth.     Evolocumab (REPATHA SURECLICK) 518 MG/ML SOAJ Inject 1 Dose into the skin every 14 (fourteen) days. 6 mL 3   ezetimibe (ZETIA) 10 MG tablet TAKE 1 TABLET BY MOUTH  DAILY 90 tablet 3   famotidine (PEPCID) 20 MG tablet Take 1 tablet (20 mg total) by mouth daily. 30  tablet 5   ferrous sulfate 325 (65 FE) MG tablet Take 325 mg by mouth 2 (two) times daily with a meal.     hydrALAZINE (APRESOLINE) 25 MG tablet TAKE 1 TABLET BY MOUTH THREE TIMES DAILY 90 tablet 0   HYDROcodone-acetaminophen (NORCO) 7.5-325 MG tablet Take 1 tablet by mouth 3 (three) times daily as needed for moderate pain. 90 tablet 0   insulin aspart (NOVOLOG) 100 UNIT/ML injection Inject 2-10 Units into the skin 3 (three) times daily before meals.     isosorbide mononitrate (IMDUR) 30 MG 24 hr tablet Take 1 tablet by mouth once daily 30 tablet 0   latanoprost (XALATAN) 0.005 % ophthalmic solution Place 1 drop into both eyes at bedtime.     LEVEMIR FLEXTOUCH 100 UNIT/ML FlexPen Inject 0-50 Units into the skin daily.     LORazepam (ATIVAN) 0.5 MG tablet Take 1 tablet (0.5 mg total) by mouth daily as needed for anxiety. 30 tablet 2   magnesium oxide (MAG-OX) 400 MG tablet Take 2 tablets (800 mg total) by mouth daily. Take 2 (400 mg) daily=800 mg 60 tablet 1   nitroGLYCERIN (NITROSTAT) 0.4 MG SL tablet Place 1 tablet (0.4 mg total) under the tongue every 5 (five) minutes as needed for chest pain. 15 tablet 2   Omega-3 Fatty Acids (FISH OIL PO) Take 1 capsule by mouth daily.     OXYGEN Inhale 2 L into the lungs at bedtime.     Probiotic CAPS Take 1 capsule by mouth daily.     promethazine (PHENERGAN) 25 MG tablet Take 1 tablet (25 mg total) by mouth every 6 (six) hours as needed for nausea or vomiting. 90 tablet 1   torsemide (DEMADEX) 20 MG tablet Take 2 tablets (40 mg total) by mouth daily.     No current facility-administered medications for this encounter.    Allergies  Allergen Reactions   Naproxen Hives and Rash   Celecoxib Other (See Comments)    Stomach pain   Aspirin Other (See Comments)    CAN NOT TAKE DUE TO ULCERS (documented previously) but takes every day currently (as of 09/29/20) without reported issues   Glucosamine Hives, Swelling and Other (See Comments)    Angioedema     Iron     IV iron transfusion - hives - Feb 2022 hospitalization   Metoclopramide Other (See Comments)    Facial twitching and stuttering   Metolazone Other (See Comments)    Acute renal failure   Shellfish-Derived Products Hives, Swelling and Other (See Comments)  Angioedema    Tramadol Nausea And Vomiting           Social History   Socioeconomic History   Marital status: Married    Spouse name: Not on file   Number of children: Not on file   Years of education: Not on file   Highest education level: Not on file  Occupational History   Not on file  Tobacco Use   Smoking status: Former    Pack years: 0.00   Smokeless tobacco: Never  Vaping Use   Vaping Use: Never used  Substance and Sexual Activity   Alcohol use: Not Currently   Drug use: Not Currently   Sexual activity: Not on file  Other Topics Concern   Not on file  Social History Narrative   Not on file   Social Determinants of Health   Financial Resource Strain: Not on file  Food Insecurity: No Food Insecurity   Worried About Running Out of Food in the Last Year: Never true   [place] in the Last Year: Never true  Transportation Needs: No Transportation Needs   Lack of Transportation (Medical): No   Lack of Transportation (Non-Medical): No  Physical Activity: Insufficiently Active   Days of Exercise per Week: 3 days   Minutes of Exercise per Session: 40 min  Stress: Not on file  Social Connections: Not on file  Intimate Partner Violence: Not on file      Family History  Problem Relation Age of Onset   Hypertension Father    Coronary artery disease Mother    Hypertension Mother    Hyperlipidemia Mother    Diabetes type II Mother    Breast cancer Mother    Lung cancer Mother    Bipolar disorder Other    Depression Other    Schizophrenia Other     Vitals:   01/04/21 1156  BP: 128/62  Pulse: 86  SpO2: 97%  Weight: 108.9 kg (240 lb)     PHYSICAL EXAM: General:  Well appearing,  obese. No respiratory difficulty HEENT: normal Neck: supple. no JVD. Carotids 2+ bilat; no bruits. No lymphadenopathy or thyromegaly appreciated. Cor: PMI nondisplaced. Regular rate & rhythm. No rubs, gallops or murmurs. Lungs: clear Abdomen: soft, nontender, nondistended. No hepatosplenomegaly. No bruits or masses. Good bowel sounds. Extremities: no cyanosis, clubbing, rash, edema Neuro: alert & oriented x 3, cranial nerves grossly intact. moves all 4 extremities w/o difficulty. Affect pleasant.  ECG: not performed  CardioMEMs: Goal dPAP: 10  Today's dPAP: 4 (volume control has been good, persistently below goal).    ASSESSMENT & PLAN:  1. CAD w/ Recent NSTEMI - Known coronary disease with prior stents to mid LAD and Prox RCA.  - NSTEMI in 08/2020. Cath deferred due to AKI. Treated medically  - readmitted 3/17 w/ CP + EKG changes w /ST depression V4/V5. HS 60>239 compatible with NSTEMI. Underwent LHC and received PCI/DES to mid circ, balloon angioplasty to OM1  - stable w/o further angina  - Continue DAPT w/ ASA + Plavix for a minimum of 12 months - Lipids undergo good control w/ Repatha (LDL 44). Followed by Dr. Debara Pickett    2. CKD Stage IV w/ CIN  - Creatinine Peak to 5.5 during admit 3/17, likely due to CIN post LHC, SCr down to 2.94 day of d/c - followed by nephrology Cr has stabilized at 1.8, last bmp one week ago - add    3. Chronic Diastolic Heart Failure - Echo 08/2020  EF 55-60%  - NYHA Class II, Euvolemic on exam and based on CardioMEMS - Change torsemide to 20mg  MWF + prn and add back Farxiga 10 daily    4. OSA - Continue CPAP nightly.   5. DM2 - recent Hgb A1c 6.8, followed by PCP  - H/o gastroparesis. No further n/v post discharge - add back [place]   6. H/o Epistaxis.  - denies any further events post discharge  7. Chronic Anemia - Baseline Hgb 8-9 range, likely 2/2 renal disease + exacerbated by Epistaxis. - CBC checked by PCP last week, Hgb stable at 8.7. No  further bleeding.    B - place Zio XT   Katherine Roan, MD 01/04/21   Patient seen and examined with the above-signed Advanced Practice Provider and/or Housestaff. I personally reviewed laboratory data, imaging studies and relevant notes. I independently examined the patient and formulated the important aspects of the plan. I have edited the note to reflect any of my changes or salient points. I have personally discussed the plan with the patient and/or family.  Doing very well. Volume status well controlled (Cardiomems readings PAD ~ 4 with goal < 10). Denies CP. Mild DOE. Recent SCr stabilized at 1.8. Having frequently nightly palpitations   General:  Well appearing. No resp difficulty HEENT: normal Neck: supple. no JVD. Carotids 2+ bilat; no bruits. No lymphadenopathy or thryomegaly appreciated. Cor: PMI nondisplaced. Regular rate & rhythm. No rubs, gallops or murmurs. Lungs: clear Abdomen: obese soft, nontender, nondistended. No hepatosplenomegaly. No bruits or masses. Good bowel sounds. Extremities: no cyanosis, clubbing, rash, edema Neuro: alert & orientedx3, cranial nerves grossly intact. moves all 4 extremities w/o difficulty. Affect pleasant   NYHA II Volume status on the low side. Will change torsemide to 20 mg MWF + PRN and add back Farxiga 10. Continue Repatha. Place Zio XT for palpitations.   [name], MD  2:12 PM

## 2021-01-04 NOTE — Patient Instructions (Addendum)
Will place Zio (14 day monitor) at today's clinic visit. Start Farxiga 10 mg daily. Change Torsemide dose to 20 mg on Mon/Wed/Fri Return to Heart Failure APP clinic in 4 months.

## 2021-01-07 DIAGNOSIS — I509 Heart failure, unspecified: Secondary | ICD-10-CM | POA: Diagnosis not present

## 2021-01-07 DIAGNOSIS — I249 Acute ischemic heart disease, unspecified: Secondary | ICD-10-CM | POA: Diagnosis not present

## 2021-01-09 ENCOUNTER — Other Ambulatory Visit (HOSPITAL_COMMUNITY): Payer: Self-pay

## 2021-01-09 ENCOUNTER — Telehealth (HOSPITAL_COMMUNITY): Payer: Self-pay | Admitting: Pharmacy Technician

## 2021-01-09 NOTE — Telephone Encounter (Signed)
Patient Advocate Encounter   Received notification from [employer] that prior authorization for Dana [name] is required.   PA submitted on CoverMyMeds Key [id number] Status is pending   Will continue to follow.

## 2021-01-10 DIAGNOSIS — G4733 Obstructive sleep apnea (adult) (pediatric): Secondary | ICD-10-CM | POA: Diagnosis not present

## 2021-01-10 DIAGNOSIS — I509 Heart failure, unspecified: Secondary | ICD-10-CM | POA: Diagnosis not present

## 2021-01-10 DIAGNOSIS — K5900 Constipation, unspecified: Secondary | ICD-10-CM | POA: Diagnosis not present

## 2021-01-10 DIAGNOSIS — I249 Acute ischemic heart disease, unspecified: Secondary | ICD-10-CM | POA: Diagnosis not present

## 2021-01-11 ENCOUNTER — Other Ambulatory Visit (HOSPITAL_COMMUNITY): Payer: Self-pay

## 2021-01-11 DIAGNOSIS — N2581 Secondary hyperparathyroidism of renal origin: Secondary | ICD-10-CM | POA: Diagnosis not present

## 2021-01-11 DIAGNOSIS — E1022 Type 1 diabetes mellitus with diabetic chronic kidney disease: Secondary | ICD-10-CM | POA: Diagnosis not present

## 2021-01-11 DIAGNOSIS — J9622 Acute and chronic respiratory failure with hypercapnia: Secondary | ICD-10-CM | POA: Diagnosis not present

## 2021-01-11 DIAGNOSIS — J440 Chronic obstructive pulmonary disease with acute lower respiratory infection: Secondary | ICD-10-CM | POA: Diagnosis not present

## 2021-01-11 DIAGNOSIS — J9690 Respiratory failure, unspecified, unspecified whether with hypoxia or hypercapnia: Secondary | ICD-10-CM | POA: Diagnosis not present

## 2021-01-11 DIAGNOSIS — I517 Cardiomegaly: Secondary | ICD-10-CM | POA: Diagnosis not present

## 2021-01-11 DIAGNOSIS — Z049 Encounter for examination and observation for unspecified reason: Secondary | ICD-10-CM | POA: Diagnosis not present

## 2021-01-11 DIAGNOSIS — J449 Chronic obstructive pulmonary disease, unspecified: Secondary | ICD-10-CM | POA: Diagnosis not present

## 2021-01-11 DIAGNOSIS — I469 Cardiac arrest, cause unspecified: Secondary | ICD-10-CM | POA: Diagnosis not present

## 2021-01-11 DIAGNOSIS — R0902 Hypoxemia: Secondary | ICD-10-CM | POA: Diagnosis not present

## 2021-01-11 DIAGNOSIS — Z794 Long term (current) use of insulin: Secondary | ICD-10-CM | POA: Diagnosis not present

## 2021-01-11 DIAGNOSIS — E1065 Type 1 diabetes mellitus with hyperglycemia: Secondary | ICD-10-CM | POA: Diagnosis not present

## 2021-01-11 DIAGNOSIS — E1043 Type 1 diabetes mellitus with diabetic autonomic (poly)neuropathy: Secondary | ICD-10-CM | POA: Diagnosis not present

## 2021-01-11 DIAGNOSIS — K3184 Gastroparesis: Secondary | ICD-10-CM | POA: Diagnosis not present

## 2021-01-11 DIAGNOSIS — D649 Anemia, unspecified: Secondary | ICD-10-CM | POA: Diagnosis not present

## 2021-01-11 DIAGNOSIS — N1832 Chronic kidney disease, stage 3b: Secondary | ICD-10-CM | POA: Diagnosis not present

## 2021-01-11 DIAGNOSIS — I5033 Acute on chronic diastolic (congestive) heart failure: Secondary | ICD-10-CM | POA: Diagnosis not present

## 2021-01-11 DIAGNOSIS — Z20822 Contact with and (suspected) exposure to covid-19: Secondary | ICD-10-CM | POA: Diagnosis not present

## 2021-01-11 DIAGNOSIS — I130 Hypertensive heart and chronic kidney disease with heart failure and stage 1 through stage 4 chronic kidney disease, or unspecified chronic kidney disease: Secondary | ICD-10-CM | POA: Diagnosis not present

## 2021-01-11 DIAGNOSIS — G4733 Obstructive sleep apnea (adult) (pediatric): Secondary | ICD-10-CM | POA: Diagnosis not present

## 2021-01-11 DIAGNOSIS — N170 Acute kidney failure with tubular necrosis: Secondary | ICD-10-CM | POA: Diagnosis not present

## 2021-01-11 DIAGNOSIS — N183 Chronic kidney disease, stage 3 unspecified: Secondary | ICD-10-CM | POA: Diagnosis not present

## 2021-01-11 DIAGNOSIS — D631 Anemia in chronic kidney disease: Secondary | ICD-10-CM | POA: Diagnosis not present

## 2021-01-11 DIAGNOSIS — N179 Acute kidney failure, unspecified: Secondary | ICD-10-CM | POA: Diagnosis not present

## 2021-01-11 DIAGNOSIS — I509 Heart failure, unspecified: Secondary | ICD-10-CM | POA: Diagnosis not present

## 2021-01-11 DIAGNOSIS — G931 Anoxic brain damage, not elsewhere classified: Secondary | ICD-10-CM | POA: Diagnosis not present

## 2021-01-11 DIAGNOSIS — A419 Sepsis, unspecified organism: Secondary | ICD-10-CM | POA: Diagnosis not present

## 2021-01-11 DIAGNOSIS — K56609 Unspecified intestinal obstruction, unspecified as to partial versus complete obstruction: Secondary | ICD-10-CM | POA: Diagnosis not present

## 2021-01-11 DIAGNOSIS — R6520 Severe sepsis without septic shock: Secondary | ICD-10-CM | POA: Diagnosis not present

## 2021-01-11 DIAGNOSIS — Z9911 Dependence on respirator [ventilator] status: Secondary | ICD-10-CM | POA: Diagnosis not present

## 2021-01-11 DIAGNOSIS — Z4682 Encounter for fitting and adjustment of non-vascular catheter: Secondary | ICD-10-CM | POA: Diagnosis not present

## 2021-01-11 DIAGNOSIS — R112 Nausea with vomiting, unspecified: Secondary | ICD-10-CM | POA: Diagnosis not present

## 2021-01-11 DIAGNOSIS — I361 Nonrheumatic tricuspid (valve) insufficiency: Secondary | ICD-10-CM | POA: Diagnosis not present

## 2021-01-11 DIAGNOSIS — E1122 Type 2 diabetes mellitus with diabetic chronic kidney disease: Secondary | ICD-10-CM | POA: Diagnosis not present

## 2021-01-11 DIAGNOSIS — N19 Unspecified kidney failure: Secondary | ICD-10-CM | POA: Diagnosis not present

## 2021-01-11 DIAGNOSIS — E785 Hyperlipidemia, unspecified: Secondary | ICD-10-CM | POA: Diagnosis not present

## 2021-01-11 DIAGNOSIS — I340 Nonrheumatic mitral (valve) insufficiency: Secondary | ICD-10-CM | POA: Diagnosis not present

## 2021-01-11 DIAGNOSIS — E1110 Type 2 diabetes mellitus with ketoacidosis without coma: Secondary | ICD-10-CM | POA: Diagnosis not present

## 2021-01-11 DIAGNOSIS — J189 Pneumonia, unspecified organism: Secondary | ICD-10-CM | POA: Diagnosis not present

## 2021-01-11 DIAGNOSIS — Z992 Dependence on renal dialysis: Secondary | ICD-10-CM | POA: Diagnosis not present

## 2021-01-11 DIAGNOSIS — E1042 Type 1 diabetes mellitus with diabetic polyneuropathy: Secondary | ICD-10-CM | POA: Diagnosis not present

## 2021-01-11 DIAGNOSIS — J9621 Acute and chronic respiratory failure with hypoxia: Secondary | ICD-10-CM | POA: Diagnosis not present

## 2021-01-11 DIAGNOSIS — I160 Hypertensive urgency: Secondary | ICD-10-CM | POA: Diagnosis not present

## 2021-01-11 DIAGNOSIS — E1010 Type 1 diabetes mellitus with ketoacidosis without coma: Secondary | ICD-10-CM | POA: Diagnosis not present

## 2021-01-11 DIAGNOSIS — K219 Gastro-esophageal reflux disease without esophagitis: Secondary | ICD-10-CM | POA: Diagnosis not present

## 2021-01-11 DIAGNOSIS — Z781 Physical restraint status: Secondary | ICD-10-CM | POA: Diagnosis not present

## 2021-01-11 DIAGNOSIS — I2510 Atherosclerotic heart disease of native coronary artery without angina pectoris: Secondary | ICD-10-CM | POA: Diagnosis not present

## 2021-01-11 DIAGNOSIS — I129 Hypertensive chronic kidney disease with stage 1 through stage 4 chronic kidney disease, or unspecified chronic kidney disease: Secondary | ICD-10-CM | POA: Diagnosis not present

## 2021-01-11 DIAGNOSIS — I491 Atrial premature depolarization: Secondary | ICD-10-CM | POA: Diagnosis not present

## 2021-01-11 DIAGNOSIS — F419 Anxiety disorder, unspecified: Secondary | ICD-10-CM | POA: Diagnosis not present

## 2021-01-11 DIAGNOSIS — I252 Old myocardial infarction: Secondary | ICD-10-CM | POA: Diagnosis not present

## 2021-01-11 DIAGNOSIS — Z7902 Long term (current) use of antithrombotics/antiplatelets: Secondary | ICD-10-CM | POA: Diagnosis not present

## 2021-01-11 DIAGNOSIS — Z6841 Body Mass Index (BMI) 40.0 and over, adult: Secondary | ICD-10-CM | POA: Diagnosis not present

## 2021-01-11 DIAGNOSIS — I5032 Chronic diastolic (congestive) heart failure: Secondary | ICD-10-CM | POA: Diagnosis not present

## 2021-01-11 DIAGNOSIS — E860 Dehydration: Secondary | ICD-10-CM | POA: Diagnosis not present

## 2021-01-11 DIAGNOSIS — D72829 Elevated white blood cell count, unspecified: Secondary | ICD-10-CM | POA: Diagnosis not present

## 2021-01-11 DIAGNOSIS — Z87891 Personal history of nicotine dependence: Secondary | ICD-10-CM | POA: Diagnosis not present

## 2021-01-11 DIAGNOSIS — Z7982 Long term (current) use of aspirin: Secondary | ICD-10-CM | POA: Diagnosis not present

## 2021-01-11 DIAGNOSIS — J90 Pleural effusion, not elsewhere classified: Secondary | ICD-10-CM | POA: Diagnosis not present

## 2021-01-11 DIAGNOSIS — K5900 Constipation, unspecified: Secondary | ICD-10-CM | POA: Diagnosis not present

## 2021-01-11 DIAGNOSIS — I639 Cerebral infarction, unspecified: Secondary | ICD-10-CM | POA: Diagnosis not present

## 2021-01-11 DIAGNOSIS — K3189 Other diseases of stomach and duodenum: Secondary | ICD-10-CM | POA: Diagnosis not present

## 2021-01-11 NOTE — Telephone Encounter (Signed)
Advanced Heart Failure Patient Advocate Encounter  Prior Authorization for Wilder Glade has been approved.    PA# [id number] Effective dates: 01/11/21 through 07/15/21  Patients co-pay is $133.80  Called patient about affordability, left message. Can sign her up for a PAN grant if she gets back to me in time. The grant is first come, first serve. Hopefully the patient calls back before it closes if she needs assistance.   Charlann Boxer, CPhT

## 2021-01-12 DIAGNOSIS — I340 Nonrheumatic mitral (valve) insufficiency: Secondary | ICD-10-CM

## 2021-01-12 DIAGNOSIS — I361 Nonrheumatic tricuspid (valve) insufficiency: Secondary | ICD-10-CM

## 2021-01-12 DIAGNOSIS — I5032 Chronic diastolic (congestive) heart failure: Secondary | ICD-10-CM | POA: Diagnosis not present

## 2021-01-13 ENCOUNTER — Telehealth (HOSPITAL_COMMUNITY): Payer: Self-pay | Admitting: *Deleted

## 2021-01-13 ENCOUNTER — Ambulatory Visit: Payer: HMO | Admitting: Internal Medicine

## 2021-01-13 DIAGNOSIS — I2510 Atherosclerotic heart disease of native coronary artery without angina pectoris: Secondary | ICD-10-CM

## 2021-01-13 DIAGNOSIS — E785 Hyperlipidemia, unspecified: Secondary | ICD-10-CM

## 2021-01-13 DIAGNOSIS — E1010 Type 1 diabetes mellitus with ketoacidosis without coma: Secondary | ICD-10-CM | POA: Diagnosis not present

## 2021-01-13 DIAGNOSIS — I5032 Chronic diastolic (congestive) heart failure: Secondary | ICD-10-CM

## 2021-01-13 NOTE — Telephone Encounter (Signed)
Pts daughter left vm that pt is admitted to [facility] and is on life support. Daughter said they are working to get her transferred and she would like a call from [place] .  Routed to [place]  Call back # [phone] (Daughter Delight Ovens)

## 2021-01-14 DIAGNOSIS — R6520 Severe sepsis without septic shock: Secondary | ICD-10-CM | POA: Diagnosis not present

## 2021-01-14 DIAGNOSIS — Z9989 Dependence on other enabling machines and devices: Secondary | ICD-10-CM | POA: Diagnosis not present

## 2021-01-14 DIAGNOSIS — Z794 Long term (current) use of insulin: Secondary | ICD-10-CM | POA: Diagnosis not present

## 2021-01-14 DIAGNOSIS — J9611 Chronic respiratory failure with hypoxia: Secondary | ICD-10-CM | POA: Diagnosis not present

## 2021-01-14 DIAGNOSIS — G931 Anoxic brain damage, not elsewhere classified: Secondary | ICD-10-CM | POA: Diagnosis not present

## 2021-01-14 DIAGNOSIS — I4891 Unspecified atrial fibrillation: Secondary | ICD-10-CM | POA: Diagnosis not present

## 2021-01-14 DIAGNOSIS — Z20822 Contact with and (suspected) exposure to covid-19: Secondary | ICD-10-CM | POA: Diagnosis not present

## 2021-01-14 DIAGNOSIS — A419 Sepsis, unspecified organism: Secondary | ICD-10-CM | POA: Diagnosis not present

## 2021-01-14 DIAGNOSIS — J9622 Acute and chronic respiratory failure with hypercapnia: Secondary | ICD-10-CM | POA: Diagnosis not present

## 2021-01-14 DIAGNOSIS — E1022 Type 1 diabetes mellitus with diabetic chronic kidney disease: Secondary | ICD-10-CM | POA: Diagnosis not present

## 2021-01-14 DIAGNOSIS — J440 Chronic obstructive pulmonary disease with acute lower respiratory infection: Secondary | ICD-10-CM | POA: Diagnosis not present

## 2021-01-14 DIAGNOSIS — E1042 Type 1 diabetes mellitus with diabetic polyneuropathy: Secondary | ICD-10-CM | POA: Diagnosis not present

## 2021-01-14 DIAGNOSIS — E1122 Type 2 diabetes mellitus with diabetic chronic kidney disease: Secondary | ICD-10-CM | POA: Diagnosis not present

## 2021-01-14 DIAGNOSIS — N179 Acute kidney failure, unspecified: Secondary | ICD-10-CM | POA: Diagnosis not present

## 2021-01-14 DIAGNOSIS — Z87891 Personal history of nicotine dependence: Secondary | ICD-10-CM | POA: Diagnosis not present

## 2021-01-14 DIAGNOSIS — M9689 Other intraoperative and postprocedural complications and disorders of the musculoskeletal system: Secondary | ICD-10-CM | POA: Diagnosis not present

## 2021-01-14 DIAGNOSIS — I130 Hypertensive heart and chronic kidney disease with heart failure and stage 1 through stage 4 chronic kidney disease, or unspecified chronic kidney disease: Secondary | ICD-10-CM | POA: Diagnosis not present

## 2021-01-14 DIAGNOSIS — I5033 Acute on chronic diastolic (congestive) heart failure: Secondary | ICD-10-CM | POA: Diagnosis not present

## 2021-01-14 DIAGNOSIS — F419 Anxiety disorder, unspecified: Secondary | ICD-10-CM | POA: Diagnosis not present

## 2021-01-14 DIAGNOSIS — R0781 Pleurodynia: Secondary | ICD-10-CM | POA: Diagnosis not present

## 2021-01-14 DIAGNOSIS — D631 Anemia in chronic kidney disease: Secondary | ICD-10-CM | POA: Diagnosis not present

## 2021-01-14 DIAGNOSIS — K219 Gastro-esophageal reflux disease without esophagitis: Secondary | ICD-10-CM | POA: Diagnosis not present

## 2021-01-14 DIAGNOSIS — G8194 Hemiplegia, unspecified affecting left nondominant side: Secondary | ICD-10-CM | POA: Diagnosis not present

## 2021-01-14 DIAGNOSIS — E785 Hyperlipidemia, unspecified: Secondary | ICD-10-CM | POA: Diagnosis not present

## 2021-01-14 DIAGNOSIS — E1043 Type 1 diabetes mellitus with diabetic autonomic (poly)neuropathy: Secondary | ICD-10-CM | POA: Diagnosis not present

## 2021-01-14 DIAGNOSIS — N2581 Secondary hyperparathyroidism of renal origin: Secondary | ICD-10-CM | POA: Diagnosis not present

## 2021-01-14 DIAGNOSIS — E782 Mixed hyperlipidemia: Secondary | ICD-10-CM | POA: Diagnosis not present

## 2021-01-14 DIAGNOSIS — Z8674 Personal history of sudden cardiac arrest: Secondary | ICD-10-CM | POA: Diagnosis not present

## 2021-01-14 DIAGNOSIS — N1832 Chronic kidney disease, stage 3b: Secondary | ICD-10-CM | POA: Diagnosis not present

## 2021-01-14 DIAGNOSIS — I5032 Chronic diastolic (congestive) heart failure: Secondary | ICD-10-CM | POA: Diagnosis not present

## 2021-01-14 DIAGNOSIS — Z7409 Other reduced mobility: Secondary | ICD-10-CM | POA: Diagnosis not present

## 2021-01-14 DIAGNOSIS — J449 Chronic obstructive pulmonary disease, unspecified: Secondary | ICD-10-CM | POA: Diagnosis not present

## 2021-01-14 DIAGNOSIS — E1051 Type 1 diabetes mellitus with diabetic peripheral angiopathy without gangrene: Secondary | ICD-10-CM | POA: Diagnosis not present

## 2021-01-14 DIAGNOSIS — I469 Cardiac arrest, cause unspecified: Secondary | ICD-10-CM | POA: Diagnosis not present

## 2021-01-14 DIAGNOSIS — Z781 Physical restraint status: Secondary | ICD-10-CM | POA: Diagnosis not present

## 2021-01-14 DIAGNOSIS — I252 Old myocardial infarction: Secondary | ICD-10-CM | POA: Diagnosis not present

## 2021-01-14 DIAGNOSIS — J9621 Acute and chronic respiratory failure with hypoxia: Secondary | ICD-10-CM | POA: Diagnosis not present

## 2021-01-14 DIAGNOSIS — Z5189 Encounter for other specified aftercare: Secondary | ICD-10-CM | POA: Diagnosis not present

## 2021-01-14 DIAGNOSIS — R531 Weakness: Secondary | ICD-10-CM | POA: Diagnosis not present

## 2021-01-14 DIAGNOSIS — Z789 Other specified health status: Secondary | ICD-10-CM | POA: Diagnosis not present

## 2021-01-14 DIAGNOSIS — K3184 Gastroparesis: Secondary | ICD-10-CM | POA: Diagnosis not present

## 2021-01-14 DIAGNOSIS — N183 Chronic kidney disease, stage 3 unspecified: Secondary | ICD-10-CM | POA: Diagnosis not present

## 2021-01-14 DIAGNOSIS — Z741 Need for assistance with personal care: Secondary | ICD-10-CM | POA: Diagnosis not present

## 2021-01-14 DIAGNOSIS — I2510 Atherosclerotic heart disease of native coronary artery without angina pectoris: Secondary | ICD-10-CM | POA: Diagnosis not present

## 2021-01-14 DIAGNOSIS — D509 Iron deficiency anemia, unspecified: Secondary | ICD-10-CM | POA: Diagnosis not present

## 2021-01-14 DIAGNOSIS — I129 Hypertensive chronic kidney disease with stage 1 through stage 4 chronic kidney disease, or unspecified chronic kidney disease: Secondary | ICD-10-CM | POA: Diagnosis not present

## 2021-01-14 DIAGNOSIS — Z6841 Body Mass Index (BMI) 40.0 and over, adult: Secondary | ICD-10-CM | POA: Diagnosis not present

## 2021-01-14 DIAGNOSIS — Z9981 Dependence on supplemental oxygen: Secondary | ICD-10-CM | POA: Diagnosis not present

## 2021-01-14 DIAGNOSIS — G4733 Obstructive sleep apnea (adult) (pediatric): Secondary | ICD-10-CM | POA: Diagnosis not present

## 2021-01-18 ENCOUNTER — Telehealth (HOSPITAL_COMMUNITY): Payer: Self-pay | Admitting: *Deleted

## 2021-01-18 ENCOUNTER — Encounter: Payer: Self-pay | Admitting: Pharmacist

## 2021-01-18 DIAGNOSIS — G720 Drug-induced myopathy: Secondary | ICD-10-CM

## 2021-01-18 NOTE — Progress Notes (Signed)
[name])                                            [place] Team    01/18/2021  Dana [name] [birth year] [id number]                           Per review of chart and payor information, patient has a diagnosis of diabetes but is not currently filling a statin prescription.  This places patient into the SUPD (Statin Use In Patients with Diabetes) measure for CMS.    Patient has documented allergy to statin but no corresponding CPT codes that would exclude patient from SUPD measure.  She is on Repatha and Ezetimibe  Upcoming PCP appointment on 01/23/21  The ASCVD Risk score Mikey Bussing DC Jr., et al., 2013) failed to calculate for the following reasons:   The patient has a prior MI or stroke diagnosis 10/17/2020     Component Value Date/Time   CHOL 175 10/17/2020 0847   TRIG 130 10/17/2020 0847   HDL 43 10/17/2020 0847   CHOLHDL 4.1 10/17/2020 0847   CHOLHDL 2.4 09/30/2020 0241   VLDL 15 09/30/2020 0241   LDLCALC 109 (H) 10/17/2020 0847    Please consider ONE of the following recommendations:  Initiate high intensity statin Atorvastatin 40mg  once daily, #90, 3 refills   Rosuvastatin 20mg  once daily, #90, 3 refills    Initiate moderate intensity          statin with reduced frequency if prior          statin intolerance 1x weekly, #13, 3 refills   2x weekly, #26, 3 refills   3x weekly, #39, 3 refills    Code for past statin intolerance or  other exclusions (required annually)  Provider Requirements: Associate code during an office visit or telehealth encounter  Drug Induced Myopathy G72.0   Myopathy, unspecified G72.9   Myositis, unspecified M60.9   Rhabdomyolysis C30.13   Alcoholic fatty liver H43.8   Cirrhosis of liver K74.69   Prediabetes R73.03   PCOS E28.2   Toxic liver disease, unspecified K71.9   Adverse effect of antihyperlipidemic and antiarteriosclerotic drugs, initial encounter O87.5Z9J     Plan: Route note to Provider prior to upcoming appointment on 01/23/21.  Elayne Guerin, PharmD, [place] Clinical Pharmacist [phone]

## 2021-01-18 NOTE — Telephone Encounter (Signed)
Spoke with daughter this morning. Patient is admitted to [facility].  Admission diagnosis PEA arrest. Per daughter pt will be discharged to rehab in a few days to regain strength. Pt was taken off the vent Saturday. Daughter said the doctors said her heart looks strong echo same as march echo (EF 55-60%).

## 2021-01-20 ENCOUNTER — Other Ambulatory Visit: Payer: Self-pay

## 2021-01-20 DIAGNOSIS — J9622 Acute and chronic respiratory failure with hypercapnia: Secondary | ICD-10-CM | POA: Diagnosis not present

## 2021-01-20 DIAGNOSIS — R0781 Pleurodynia: Secondary | ICD-10-CM | POA: Diagnosis not present

## 2021-01-20 DIAGNOSIS — M9689 Other intraoperative and postprocedural complications and disorders of the musculoskeletal system: Secondary | ICD-10-CM | POA: Diagnosis not present

## 2021-01-20 DIAGNOSIS — G931 Anoxic brain damage, not elsewhere classified: Secondary | ICD-10-CM | POA: Diagnosis not present

## 2021-01-20 DIAGNOSIS — Z9989 Dependence on other enabling machines and devices: Secondary | ICD-10-CM | POA: Diagnosis not present

## 2021-01-20 DIAGNOSIS — D631 Anemia in chronic kidney disease: Secondary | ICD-10-CM | POA: Diagnosis not present

## 2021-01-20 DIAGNOSIS — E875 Hyperkalemia: Secondary | ICD-10-CM | POA: Diagnosis not present

## 2021-01-20 DIAGNOSIS — E1022 Type 1 diabetes mellitus with diabetic chronic kidney disease: Secondary | ICD-10-CM | POA: Diagnosis not present

## 2021-01-20 DIAGNOSIS — Z6841 Body Mass Index (BMI) 40.0 and over, adult: Secondary | ICD-10-CM | POA: Diagnosis not present

## 2021-01-20 DIAGNOSIS — I4891 Unspecified atrial fibrillation: Secondary | ICD-10-CM | POA: Diagnosis not present

## 2021-01-20 DIAGNOSIS — Z5189 Encounter for other specified aftercare: Secondary | ICD-10-CM | POA: Diagnosis not present

## 2021-01-20 DIAGNOSIS — E782 Mixed hyperlipidemia: Secondary | ICD-10-CM | POA: Diagnosis not present

## 2021-01-20 DIAGNOSIS — Z741 Need for assistance with personal care: Secondary | ICD-10-CM | POA: Diagnosis not present

## 2021-01-20 DIAGNOSIS — R413 Other amnesia: Secondary | ICD-10-CM | POA: Insufficient documentation

## 2021-01-20 DIAGNOSIS — D509 Iron deficiency anemia, unspecified: Secondary | ICD-10-CM | POA: Diagnosis not present

## 2021-01-20 DIAGNOSIS — I469 Cardiac arrest, cause unspecified: Secondary | ICD-10-CM | POA: Diagnosis not present

## 2021-01-20 DIAGNOSIS — R531 Weakness: Secondary | ICD-10-CM | POA: Diagnosis not present

## 2021-01-20 DIAGNOSIS — R202 Paresthesia of skin: Secondary | ICD-10-CM | POA: Diagnosis not present

## 2021-01-20 DIAGNOSIS — D72829 Elevated white blood cell count, unspecified: Secondary | ICD-10-CM | POA: Diagnosis not present

## 2021-01-20 DIAGNOSIS — M5481 Occipital neuralgia: Secondary | ICD-10-CM | POA: Diagnosis not present

## 2021-01-20 DIAGNOSIS — I2510 Atherosclerotic heart disease of native coronary artery without angina pectoris: Secondary | ICD-10-CM | POA: Diagnosis not present

## 2021-01-20 DIAGNOSIS — Z7409 Other reduced mobility: Secondary | ICD-10-CM | POA: Diagnosis not present

## 2021-01-20 DIAGNOSIS — G8194 Hemiplegia, unspecified affecting left nondominant side: Secondary | ICD-10-CM | POA: Diagnosis not present

## 2021-01-20 DIAGNOSIS — I5030 Unspecified diastolic (congestive) heart failure: Secondary | ICD-10-CM | POA: Diagnosis not present

## 2021-01-20 DIAGNOSIS — I5033 Acute on chronic diastolic (congestive) heart failure: Secondary | ICD-10-CM | POA: Diagnosis not present

## 2021-01-20 DIAGNOSIS — I129 Hypertensive chronic kidney disease with stage 1 through stage 4 chronic kidney disease, or unspecified chronic kidney disease: Secondary | ICD-10-CM | POA: Diagnosis not present

## 2021-01-20 DIAGNOSIS — N183 Chronic kidney disease, stage 3 unspecified: Secondary | ICD-10-CM | POA: Diagnosis not present

## 2021-01-20 DIAGNOSIS — N179 Acute kidney failure, unspecified: Secondary | ICD-10-CM | POA: Diagnosis not present

## 2021-01-20 DIAGNOSIS — J90 Pleural effusion, not elsewhere classified: Secondary | ICD-10-CM | POA: Diagnosis not present

## 2021-01-20 DIAGNOSIS — Z789 Other specified health status: Secondary | ICD-10-CM | POA: Insufficient documentation

## 2021-01-20 DIAGNOSIS — Z8674 Personal history of sudden cardiac arrest: Secondary | ICD-10-CM | POA: Diagnosis not present

## 2021-01-20 DIAGNOSIS — E1122 Type 2 diabetes mellitus with diabetic chronic kidney disease: Secondary | ICD-10-CM | POA: Diagnosis not present

## 2021-01-20 DIAGNOSIS — E1043 Type 1 diabetes mellitus with diabetic autonomic (poly)neuropathy: Secondary | ICD-10-CM | POA: Diagnosis not present

## 2021-01-20 DIAGNOSIS — I130 Hypertensive heart and chronic kidney disease with heart failure and stage 1 through stage 4 chronic kidney disease, or unspecified chronic kidney disease: Secondary | ICD-10-CM | POA: Diagnosis not present

## 2021-01-20 DIAGNOSIS — Z87891 Personal history of nicotine dependence: Secondary | ICD-10-CM | POA: Diagnosis not present

## 2021-01-20 DIAGNOSIS — N1832 Chronic kidney disease, stage 3b: Secondary | ICD-10-CM | POA: Diagnosis not present

## 2021-01-20 DIAGNOSIS — G4733 Obstructive sleep apnea (adult) (pediatric): Secondary | ICD-10-CM | POA: Diagnosis not present

## 2021-01-20 DIAGNOSIS — R0782 Intercostal pain: Secondary | ICD-10-CM | POA: Diagnosis not present

## 2021-01-20 DIAGNOSIS — J449 Chronic obstructive pulmonary disease, unspecified: Secondary | ICD-10-CM | POA: Diagnosis not present

## 2021-01-20 DIAGNOSIS — S069X0A Unspecified intracranial injury without loss of consciousness, initial encounter: Secondary | ICD-10-CM | POA: Diagnosis not present

## 2021-01-20 DIAGNOSIS — E1051 Type 1 diabetes mellitus with diabetic peripheral angiopathy without gangrene: Secondary | ICD-10-CM | POA: Diagnosis not present

## 2021-01-20 DIAGNOSIS — I509 Heart failure, unspecified: Secondary | ICD-10-CM | POA: Diagnosis not present

## 2021-01-20 DIAGNOSIS — E1110 Type 2 diabetes mellitus with ketoacidosis without coma: Secondary | ICD-10-CM | POA: Diagnosis not present

## 2021-01-20 DIAGNOSIS — Z794 Long term (current) use of insulin: Secondary | ICD-10-CM | POA: Diagnosis not present

## 2021-01-20 DIAGNOSIS — J9611 Chronic respiratory failure with hypoxia: Secondary | ICD-10-CM | POA: Diagnosis not present

## 2021-01-20 DIAGNOSIS — J9621 Acute and chronic respiratory failure with hypoxia: Secondary | ICD-10-CM | POA: Diagnosis not present

## 2021-01-20 DIAGNOSIS — Z9981 Dependence on supplemental oxygen: Secondary | ICD-10-CM | POA: Diagnosis not present

## 2021-01-20 DIAGNOSIS — I249 Acute ischemic heart disease, unspecified: Secondary | ICD-10-CM | POA: Diagnosis not present

## 2021-01-23 ENCOUNTER — Ambulatory Visit: Payer: HMO | Admitting: Family Medicine

## 2021-01-23 DIAGNOSIS — R600 Localized edema: Secondary | ICD-10-CM | POA: Insufficient documentation

## 2021-01-23 DIAGNOSIS — R0782 Intercostal pain: Secondary | ICD-10-CM | POA: Diagnosis not present

## 2021-01-24 ENCOUNTER — Encounter (HOSPITAL_COMMUNITY): Payer: Self-pay

## 2021-01-24 DIAGNOSIS — J90 Pleural effusion, not elsewhere classified: Secondary | ICD-10-CM | POA: Diagnosis not present

## 2021-01-25 DIAGNOSIS — S069X0A Unspecified intracranial injury without loss of consciousness, initial encounter: Secondary | ICD-10-CM | POA: Diagnosis not present

## 2021-01-25 DIAGNOSIS — R202 Paresthesia of skin: Secondary | ICD-10-CM | POA: Insufficient documentation

## 2021-01-30 ENCOUNTER — Ambulatory Visit: Payer: HMO | Admitting: Internal Medicine

## 2021-02-06 DIAGNOSIS — Z8674 Personal history of sudden cardiac arrest: Secondary | ICD-10-CM | POA: Insufficient documentation

## 2021-02-07 ENCOUNTER — Telehealth: Payer: Self-pay

## 2021-02-07 DIAGNOSIS — Z8674 Personal history of sudden cardiac arrest: Secondary | ICD-10-CM | POA: Diagnosis not present

## 2021-02-07 DIAGNOSIS — N2581 Secondary hyperparathyroidism of renal origin: Secondary | ICD-10-CM | POA: Diagnosis not present

## 2021-02-07 DIAGNOSIS — I2510 Atherosclerotic heart disease of native coronary artery without angina pectoris: Secondary | ICD-10-CM | POA: Diagnosis not present

## 2021-02-07 DIAGNOSIS — K219 Gastro-esophageal reflux disease without esophagitis: Secondary | ICD-10-CM | POA: Diagnosis not present

## 2021-02-07 DIAGNOSIS — J449 Chronic obstructive pulmonary disease, unspecified: Secondary | ICD-10-CM | POA: Diagnosis not present

## 2021-02-07 DIAGNOSIS — E782 Mixed hyperlipidemia: Secondary | ICD-10-CM | POA: Diagnosis not present

## 2021-02-07 DIAGNOSIS — D631 Anemia in chronic kidney disease: Secondary | ICD-10-CM | POA: Diagnosis not present

## 2021-02-07 DIAGNOSIS — I130 Hypertensive heart and chronic kidney disease with heart failure and stage 1 through stage 4 chronic kidney disease, or unspecified chronic kidney disease: Secondary | ICD-10-CM | POA: Diagnosis not present

## 2021-02-07 DIAGNOSIS — E1122 Type 2 diabetes mellitus with diabetic chronic kidney disease: Secondary | ICD-10-CM | POA: Diagnosis not present

## 2021-02-07 DIAGNOSIS — Z7902 Long term (current) use of antithrombotics/antiplatelets: Secondary | ICD-10-CM | POA: Diagnosis not present

## 2021-02-07 DIAGNOSIS — N1832 Chronic kidney disease, stage 3b: Secondary | ICD-10-CM | POA: Diagnosis not present

## 2021-02-07 DIAGNOSIS — Z794 Long term (current) use of insulin: Secondary | ICD-10-CM | POA: Diagnosis not present

## 2021-02-07 DIAGNOSIS — Z79891 Long term (current) use of opiate analgesic: Secondary | ICD-10-CM | POA: Diagnosis not present

## 2021-02-07 DIAGNOSIS — E1142 Type 2 diabetes mellitus with diabetic polyneuropathy: Secondary | ICD-10-CM | POA: Diagnosis not present

## 2021-02-07 DIAGNOSIS — G931 Anoxic brain damage, not elsewhere classified: Secondary | ICD-10-CM | POA: Diagnosis not present

## 2021-02-07 DIAGNOSIS — D509 Iron deficiency anemia, unspecified: Secondary | ICD-10-CM | POA: Diagnosis not present

## 2021-02-07 DIAGNOSIS — G4733 Obstructive sleep apnea (adult) (pediatric): Secondary | ICD-10-CM | POA: Diagnosis not present

## 2021-02-07 DIAGNOSIS — I5030 Unspecified diastolic (congestive) heart failure: Secondary | ICD-10-CM | POA: Diagnosis not present

## 2021-02-07 DIAGNOSIS — Z79899 Other long term (current) drug therapy: Secondary | ICD-10-CM | POA: Diagnosis not present

## 2021-02-07 NOTE — Telephone Encounter (Signed)
[name] calling after doing TOC on pt. On d/c paperwork pt was to have pantoprazole. This medication was not sent in. Also pt does not know whether she is to continue repatha until visit as it does not state on d/c paperwork.   Hospitalization is in epic. 7/2 and 7/8 admission. After 7/8 pt was d/c to rehab. Pt came home yesterday from rehab. Pt has hospital fu next week. Please advise.   Royce Macadamia, [place] 02/07/21 4:11 PM

## 2021-02-07 NOTE — Telephone Encounter (Signed)
Pt states Dexilant is too expensive. Pt is not taking.   Harrell Lark 02/07/21 4:36 PM

## 2021-02-08 ENCOUNTER — Other Ambulatory Visit: Payer: Self-pay | Admitting: Physician Assistant

## 2021-02-08 ENCOUNTER — Telehealth: Payer: Self-pay

## 2021-02-08 MED ORDER — PANTOPRAZOLE SODIUM 40 MG PO TBEC: 40.0000 mg | DELAYED_RELEASE_TABLET | Freq: Every day | ORAL | 3 refills | Status: DC

## 2021-02-08 NOTE — Telephone Encounter (Signed)
[facility] [address]   Royce Macadamia, [place] 02/08/21 8:59 AM

## 2021-02-08 NOTE — Telephone Encounter (Signed)
Dana [name], PT w/ [facility] calling for verbal orders. Requesting to see pt twice a week for eight weeks. Gave verbal orders.   Royce Macadamia, [place] 02/08/21 10:19 AM

## 2021-02-09 DIAGNOSIS — G4733 Obstructive sleep apnea (adult) (pediatric): Secondary | ICD-10-CM | POA: Diagnosis not present

## 2021-02-09 DIAGNOSIS — I509 Heart failure, unspecified: Secondary | ICD-10-CM | POA: Diagnosis not present

## 2021-02-09 DIAGNOSIS — I249 Acute ischemic heart disease, unspecified: Secondary | ICD-10-CM | POA: Diagnosis not present

## 2021-02-10 ENCOUNTER — Telehealth: Payer: Self-pay

## 2021-02-10 NOTE — Telephone Encounter (Signed)
[facility] calling requesting verbal orders. Requesting to see pt once a week for five weeks. Gave verbal orders.   Royce Macadamia, [place] 02/10/21 11:06 AM

## 2021-02-13 ENCOUNTER — Ambulatory Visit (INDEPENDENT_AMBULATORY_CARE_PROVIDER_SITE_OTHER): Payer: HMO | Admitting: Legal Medicine

## 2021-02-13 ENCOUNTER — Encounter: Payer: Self-pay | Admitting: Legal Medicine

## 2021-02-13 ENCOUNTER — Encounter: Payer: Self-pay | Admitting: Family Medicine

## 2021-02-13 ENCOUNTER — Other Ambulatory Visit: Payer: Self-pay

## 2021-02-13 ENCOUNTER — Telehealth: Payer: Self-pay | Admitting: Physical Medicine and Rehabilitation

## 2021-02-13 VITALS — BP 132/66 | HR 76 | Temp 97.4°F | Ht 65.0 in | Wt 238.4 lb

## 2021-02-13 DIAGNOSIS — Z79891 Long term (current) use of opiate analgesic: Secondary | ICD-10-CM | POA: Diagnosis not present

## 2021-02-13 DIAGNOSIS — Z7409 Other reduced mobility: Secondary | ICD-10-CM

## 2021-02-13 DIAGNOSIS — E782 Mixed hyperlipidemia: Secondary | ICD-10-CM | POA: Diagnosis not present

## 2021-02-13 DIAGNOSIS — E1021 Type 1 diabetes mellitus with diabetic nephropathy: Secondary | ICD-10-CM | POA: Diagnosis not present

## 2021-02-13 DIAGNOSIS — Z794 Long term (current) use of insulin: Secondary | ICD-10-CM | POA: Diagnosis not present

## 2021-02-13 DIAGNOSIS — N1832 Chronic kidney disease, stage 3b: Secondary | ICD-10-CM

## 2021-02-13 DIAGNOSIS — F330 Major depressive disorder, recurrent, mild: Secondary | ICD-10-CM

## 2021-02-13 DIAGNOSIS — Z789 Other specified health status: Secondary | ICD-10-CM | POA: Diagnosis not present

## 2021-02-13 DIAGNOSIS — N171 Acute kidney failure with acute cortical necrosis: Secondary | ICD-10-CM | POA: Diagnosis not present

## 2021-02-13 DIAGNOSIS — G931 Anoxic brain damage, not elsewhere classified: Secondary | ICD-10-CM | POA: Diagnosis not present

## 2021-02-13 DIAGNOSIS — D631 Anemia in chronic kidney disease: Secondary | ICD-10-CM | POA: Diagnosis not present

## 2021-02-13 DIAGNOSIS — I4819 Other persistent atrial fibrillation: Secondary | ICD-10-CM | POA: Diagnosis not present

## 2021-02-13 DIAGNOSIS — R41841 Cognitive communication deficit: Secondary | ICD-10-CM

## 2021-02-13 DIAGNOSIS — D509 Iron deficiency anemia, unspecified: Secondary | ICD-10-CM | POA: Diagnosis not present

## 2021-02-13 DIAGNOSIS — Z8674 Personal history of sudden cardiac arrest: Secondary | ICD-10-CM | POA: Diagnosis not present

## 2021-02-13 DIAGNOSIS — G4733 Obstructive sleep apnea (adult) (pediatric): Secondary | ICD-10-CM | POA: Diagnosis not present

## 2021-02-13 DIAGNOSIS — I2510 Atherosclerotic heart disease of native coronary artery without angina pectoris: Secondary | ICD-10-CM | POA: Diagnosis not present

## 2021-02-13 DIAGNOSIS — J449 Chronic obstructive pulmonary disease, unspecified: Secondary | ICD-10-CM | POA: Diagnosis not present

## 2021-02-13 DIAGNOSIS — F1120 Opioid dependence, uncomplicated: Secondary | ICD-10-CM | POA: Diagnosis not present

## 2021-02-13 DIAGNOSIS — E1022 Type 1 diabetes mellitus with diabetic chronic kidney disease: Secondary | ICD-10-CM

## 2021-02-13 DIAGNOSIS — K5909 Other constipation: Secondary | ICD-10-CM

## 2021-02-13 DIAGNOSIS — E1122 Type 2 diabetes mellitus with diabetic chronic kidney disease: Secondary | ICD-10-CM | POA: Diagnosis not present

## 2021-02-13 DIAGNOSIS — I130 Hypertensive heart and chronic kidney disease with heart failure and stage 1 through stage 4 chronic kidney disease, or unspecified chronic kidney disease: Secondary | ICD-10-CM | POA: Diagnosis not present

## 2021-02-13 DIAGNOSIS — I5030 Unspecified diastolic (congestive) heart failure: Secondary | ICD-10-CM | POA: Diagnosis not present

## 2021-02-13 DIAGNOSIS — N2581 Secondary hyperparathyroidism of renal origin: Secondary | ICD-10-CM | POA: Diagnosis not present

## 2021-02-13 DIAGNOSIS — E1142 Type 2 diabetes mellitus with diabetic polyneuropathy: Secondary | ICD-10-CM | POA: Diagnosis not present

## 2021-02-13 DIAGNOSIS — K219 Gastro-esophageal reflux disease without esophagitis: Secondary | ICD-10-CM | POA: Diagnosis not present

## 2021-02-13 DIAGNOSIS — Z79899 Other long term (current) drug therapy: Secondary | ICD-10-CM | POA: Diagnosis not present

## 2021-02-13 DIAGNOSIS — Z7902 Long term (current) use of antithrombotics/antiplatelets: Secondary | ICD-10-CM | POA: Diagnosis not present

## 2021-02-13 MED ORDER — LACTULOSE 10 GM/15ML PO SOLN: 10.0000 g | Freq: Every day | ORAL | 0 refills | Status: DC | PRN

## 2021-02-13 NOTE — Progress Notes (Signed)
Established Patient Office Visit  Subjective:  Patient ID: Dana [name], female    DOB: [birth year]  Age: 60 y.o. MRN: [id number]  CC:  Chief Complaint  Patient presents with   Hospitalization Follow-up    Was discharged on 02/06/2021    HPI [name] presents for transition of care and reconciliation of medicines Patient admitted 01/15/2020 Patient has PEA and was resuscitated. She had hypoxemic stroke- diffuse. Came into the hospital from [facility] as a PEA arrest on 6/30 status post ROSC in 10 to 15 minutes, DKA on arrival as well as AKI on CKD with oliguria. Everything is improving for now including AKI and patient is transferred out of the ICU. Patient's creatinine has normalized along with other findings. Intermittently there was a concern for stroke due to patient's left-sided weakness for which reason MRI was done and MRI was confirming for anoxic brain injury involving hippocampi on the left side as well as diffusion abnormality in the right basal ganglia. PT/OT and speech therapy is working with this patient . MRI showed diffuse abnormality involving both hippocampi, right basal ganglia, small vessel disease #Status postcardiac arrest with diffusion abnormality in hippocampi/right basal ganglia #Mild chronic small vessel disease #Left-sided weakness #Difficulty with speech Discharges 01/21/2020  Readmitted to rehabilitation and discharged on 02/02/2020.  No speech problems seeing speech therapy.  Weakness left side using cane.  She has improved. Swallowing OK.  Occasional lases in memory. Can do finances.lives with husband.  Last catheterization: . 3 vessel CAD with moderate mid-LAD and mid-RCA stenoses, and severe mid-LCx stenosis 2. Successful PCI of the mid-circumflex, reducing severe 90% stenosis to 0% with a 2.75x22 mm Resolute Onyx DES, provisional side branch angioplasty of the OM1 with a 2.5 mm balloon through the stent struts 3. Moderately elevated  LVEDP  She has chronic Diabetes and renal failure- stage 4, retinopathy.   Past Medical History:  Diagnosis Date   Acquired absence of left great toe [facility])    Acquired absence of other left toe(s) (HCC)    Anginal pain ([place])    Atherosclerotic heart disease of native coronary artery without angina pectoris    CAD (coronary artery disease)    08/2016 DES to RCA, also with history of LAD stenting.    Chronic diastolic (congestive) heart failure (HCC)    Chronic kidney disease, stage 2 (mild)    Chronic systolic (congestive) heart failure (HCC)    Contrast dye induced nephropathy, possible 09/03/2020   Diabetes (HCC)    Diastolic CHF (HCC)    Gastro-esophageal reflux disease without esophagitis    Hyperlipidemia    Hypertension    Hypertensive heart disease with heart failure (HCC)    Hypoxemia    Idiopathic gout, multiple sites    Localized edema    Low back pain    Mixed hyperlipidemia    Mixed incontinence    Morbid (severe) obesity due to excess calories (HCC)    Non-ST elevation (NSTEMI) myocardial infarction ([place]) 09/03/2020   OSA (obstructive sleep apnea)    OSA (obstructive sleep apnea) 09/03/2020   Other specified hypothyroidism    Primary insomnia    Type 2 diabetes mellitus with complication, without long-term current use of insulin ([place]) 09/03/2020   Type 2 diabetes mellitus with diabetic nephropathy ([place])     Past Surgical History:  Procedure Laterality Date   ABDOMINAL HYSTERECTOMY     Partial- Still has both ovaries   CARDIOVASCULAR STRESS TEST  08/13/2014   Nuclear; Normal  CARPAL TUNNEL RELEASE     CATARACT EXTRACTION, BILATERAL     CESAREAN SECTION     CHOLECYSTECTOMY     CORONARY ANGIOPLASTY WITH STENT PLACEMENT     3 blockages/ 1 stent   CORONARY STENT INTERVENTION N/A 09/29/2020   Procedure: CORONARY STENT INTERVENTION;  Surgeon: Sherren Mocha, MD;  Location: [place] [facility];  Service: Cardiovascular;  Laterality: N/A;  CFX   EYE SURGERY      LEFT HEART CATH AND CORONARY ANGIOGRAPHY N/A 09/29/2020   Procedure: LEFT HEART CATH AND CORONARY ANGIOGRAPHY;  Surgeon: Sherren Mocha, MD;  Location: [place] [facility];  Service: Cardiovascular;  Laterality: N/A;   RIGHT/LEFT HEART CATH AND CORONARY ANGIOGRAPHY N/A 01/07/2017   Procedure: Right/Left Heart Cath and Coronary Angiography;  Surgeon: Larey Dresser, MD;  Location: [place] [facility];  Service: Cardiovascular;  Laterality: N/A;   ROTATOR CUFF REPAIR     TOE AMPUTATION Left    US ECHOCARDIOGRAPHY  05/2017   Normal    Family History  Problem Relation Age of Onset   Hypertension Father    Coronary artery disease Mother    Hypertension Mother    Hyperlipidemia Mother    Diabetes type II Mother    Breast cancer Mother    Lung cancer Mother    Bipolar disorder Other    Depression Other    Schizophrenia Other     Social History   Socioeconomic History   Marital status: Married    Spouse name: Not on file   Number of children: Not on file   Years of education: Not on file   Highest education level: Not on file  Occupational History   Not on file  Tobacco Use   Smoking status: Former   Smokeless tobacco: Never  Vaping Use   Vaping Use: Never used  Substance and Sexual Activity   Alcohol use: Not Currently   Drug use: Not Currently   Sexual activity: Not on file  Other Topics Concern   Not on file  Social History Narrative   Not on file   Social Determinants of Health   Financial Resource Strain: Not on file  Food Insecurity: No Food Insecurity   Worried About Running Out of Food in the Last Year: Never true   [place] in the Last Year: Never true  Transportation Needs: No Transportation Needs   Lack of Transportation (Medical): No   Lack of Transportation (Non-Medical): No  Physical Activity: Insufficiently Active   Days of Exercise per Week: 3 days   Minutes of Exercise per Session: 40 min  Stress: Not on file  Social Connections: Not on file   Intimate Partner Violence: Not on file    Outpatient Medications Prior to Visit  Medication Sig Dispense Refill   allopurinol (ZYLOPRIM) 100 MG tablet Take 1 tablet (100 mg total) by mouth daily. 90 tablet 0   aspirin 81 MG chewable tablet Chew 81 mg by mouth daily.     BD PEN NEEDLE NANO 2ND GEN 32G X 4 MM MISC 4 (four) times daily.     carvedilol (COREG) 12.5 MG tablet Take 0.5 tablets (6.25 mg total) by mouth 2 (two) times daily. 60 tablet 11   clopidogrel (PLAVIX) 75 MG tablet Take 1 tablet (75 mg total) by mouth at bedtime. 90 tablet 3   Cyanocobalamin 3000 MCG CAPS Take 2 capsules by mouth daily. Patient purchased 3000 mcg gummies and taking 2 gummies daily  dapagliflozin propanediol (FARXIGA) 10 MG TABS tablet Take 1 tablet (10 mg total) by mouth daily before breakfast. (Patient taking differently: Take 10 mg by mouth daily before breakfast. On hold) 30 tablet 5   EPINEPHrine 0.3 mg/0.3 mL IJ SOAJ injection Use as directed for life-threatening allergic reaction. 4 Device 3   Evolocumab (REPATHA SURECLICK) 659 MG/ML SOAJ Inject 1 Dose into the skin every 14 (fourteen) days. 6 mL 3   ezetimibe (ZETIA) 10 MG tablet TAKE 1 TABLET BY MOUTH  DAILY 90 tablet 3   famotidine (PEPCID) 20 MG tablet Take 1 tablet (20 mg total) by mouth daily. 30 tablet 5   ferrous sulfate 325 (65 FE) MG tablet Take 325 mg by mouth 2 (two) times daily with a meal.     hydrALAZINE (APRESOLINE) 25 MG tablet TAKE 1 TABLET BY MOUTH THREE TIMES DAILY 90 tablet 0   HYDROcodone-acetaminophen (NORCO) 7.5-325 MG tablet Take 1 tablet by mouth 3 (three) times daily as needed for moderate pain. 90 tablet 0   insulin aspart (NOVOLOG) 100 UNIT/ML injection Inject 2-10 Units into the skin 3 (three) times daily before meals.     isosorbide mononitrate (IMDUR) 30 MG 24 hr tablet Take 1 tablet by mouth once daily 30 tablet 0   latanoprost (XALATAN) 0.005 % ophthalmic solution Place 1 drop into both eyes at bedtime.     LEVEMIR  FLEXTOUCH 100 UNIT/ML FlexPen Inject 0-50 Units into the skin daily.     LORazepam (ATIVAN) 0.5 MG tablet Take 1 tablet (0.5 mg total) by mouth daily as needed for anxiety. 30 tablet 2   magnesium oxide (MAG-OX) 400 MG tablet Take 2 tablets (800 mg total) by mouth daily. Take 2 (400 mg) daily=800 mg 60 tablet 1   nitroGLYCERIN (NITROSTAT) 0.4 MG SL tablet Place 1 tablet (0.4 mg total) under the tongue every 5 (five) minutes as needed for chest pain. 15 tablet 2   Omega-3 Fatty Acids (FISH OIL PO) Take 1 capsule by mouth daily.     OXYGEN Inhale 2 L into the lungs at bedtime.     pantoprazole (PROTONIX) 40 MG tablet Take 1 tablet (40 mg total) by mouth daily. 30 tablet 3   Probiotic CAPS Take 1 capsule by mouth daily.     promethazine (PHENERGAN) 25 MG tablet Take 1 tablet (25 mg total) by mouth every 6 (six) hours as needed for nausea or vomiting. 90 tablet 1   torsemide (DEMADEX) 20 MG tablet Take 1 tablet (20 mg total) by mouth every Monday, Wednesday, and Friday. Or as directed (Patient taking differently: Take 20 mg by mouth every Monday, Wednesday, and Friday. Or as directed, on hold as of 02/13/21) 60 tablet 3   epoetin alfa-epbx (RETACRIT) 93570 UNIT/ML injection Inject into the skin.     No facility-administered medications prior to visit.    Allergies  Allergen Reactions   Naproxen Hives and Rash   Celecoxib Other (See Comments)    Stomach pain   Aspirin Other (See Comments)    CAN NOT TAKE DUE TO ULCERS (documented previously) but takes every day currently (as of 09/29/20) without reported issues   Glucosamine Hives, Swelling and Other (See Comments)    Angioedema    Iron     IV iron transfusion - hives - Feb 2022 hospitalization   Metoclopramide Other (See Comments)    Facial twitching and stuttering   Metolazone Other (See Comments)    Acute renal failure   Shellfish-Derived Products Hives, Swelling  and Other (See Comments)    Angioedema    Tramadol Nausea And Vomiting          ROS Review of Systems  Constitutional:  Negative for activity change and appetite change.  HENT:  Negative for congestion.   Eyes:  Negative for visual disturbance.  Respiratory:  Negative for chest tightness and shortness of breath.   Cardiovascular:  Negative for chest pain, palpitations and leg swelling.  Gastrointestinal:  Negative for abdominal distention and abdominal pain.  Genitourinary:  Negative for difficulty urinating and dysuria.  Neurological:  Positive for weakness (right side, weak grip).     Objective:    Physical Exam Vitals reviewed.  Constitutional:      General: She is not in acute distress.    Appearance: Normal appearance.  HENT:     Right Ear: Tympanic membrane, ear canal and external ear normal.     Left Ear: Tympanic membrane, ear canal and external ear normal.     Mouth/Throat:     Mouth: Mucous membranes are moist.     Pharynx: Oropharynx is clear.  Eyes:     Extraocular Movements: Extraocular movements intact.     Conjunctiva/sclera: Conjunctivae normal.     Pupils: Pupils are equal, round, and reactive to light.  Cardiovascular:     Rate and Rhythm: Normal rate and regular rhythm.     Pulses: Normal pulses.     Heart sounds: Normal heart sounds. No murmur heard.   No gallop.  Pulmonary:     Effort: Pulmonary effort is normal. No respiratory distress.     Breath sounds: Normal breath sounds. No wheezing.  Abdominal:     General: Abdomen is flat. Bowel sounds are normal. There is no distension.     Palpations: Abdomen is soft.     Tenderness: There is no abdominal tenderness.  Musculoskeletal:     Cervical back: Normal range of motion.     Right lower leg: Edema present.     Left lower leg: Edema present.     Comments: Both legs wrapped  Skin:    General: Skin is warm.     Capillary Refill: Capillary refill takes less than 2 seconds.  Neurological:     Mental Status: She is alert and oriented to person, place, and time. Mental  status is at baseline.     Motor: Weakness present.     Gait: Gait abnormal (walks with cane).     Comments: Trouble finding words, right arm is weak and some weakness right leg  Psychiatric:        Mood and Affect: Mood normal.        Thought Content: Thought content normal.    BP 132/66   Pulse 76   Temp (!) 97.4 F (36.3 C)   Ht 5' 5"  (1.651 m)   Wt 238 lb 6.4 oz (108.1 kg)   SpO2 98%   BMI 39.67 kg/m  Wt Readings from Last 3 Encounters:  02/13/21 238 lb 6.4 oz (108.1 kg)  01/04/21 240 lb (108.9 kg)  12/07/20 242 lb (109.8 kg)     Health Maintenance Due  Topic Date Due   TETANUS/TDAP  Never done   Zoster Vaccines- Shingrix (1 of 2) Never done   MAMMOGRAM  06/24/2020   COVID-19 Vaccine (2 - Moderna series) 07/26/2020   INFLUENZA VACCINE  02/13/2021    There are no preventive care reminders to display for this patient.  Lab Results  Component Value Date  TSH 3.590 03/23/2020   Lab Results  Component Value Date   WBC 8.5 10/17/2020   HGB 8.7 (L) 10/17/2020   HCT 27.1 (L) 10/17/2020   MCV 86 10/17/2020   PLT 355 10/17/2020   Lab Results  Component Value Date   NA 143 10/17/2020   K 4.4 10/17/2020   CO2 24 10/17/2020   GLUCOSE 54 (L) 10/17/2020   BUN 44 (H) 10/17/2020   CREATININE 1.81 (H) 10/17/2020   BILITOT 0.3 10/17/2020   ALKPHOS 122 (H) 10/17/2020   AST 12 10/17/2020   ALT 12 10/17/2020   PROT 7.0 10/17/2020   ALBUMIN 4.1 10/17/2020   CALCIUM 9.1 10/17/2020   ANIONGAP 9 10/06/2020   EGFR 32 (L) 10/17/2020   Lab Results  Component Value Date   CHOL 175 10/17/2020   Lab Results  Component Value Date   HDL 43 10/17/2020   Lab Results  Component Value Date   LDLCALC 109 (H) 10/17/2020   Lab Results  Component Value Date   TRIG 130 10/17/2020   Lab Results  Component Value Date   CHOLHDL 4.1 10/17/2020   Lab Results  Component Value Date   HGBA1C 6.8 (H) 09/30/2020      Assessment & Plan:   Diagnoses and all orders for this  visit: Acute renal failure with acute cortical necrosis ([place]) -     Basic Metabolic Panel Patient has PEA and resuscitation with resultant ARF.  Uncomplicated opioid dependence ([place]) AN INDIVIDUAL CARE PLAN was established and reinforced today.  The patient's status was assessed using clinical findings on exam, labs, and other diagnostic testing. Patient's success at meeting treatment goals based on disease specific evidence-bassed guidelines and found to be in fair control. RECOMMENDATIONS include maintining present medicines and treatment. He is on chronichydrocodone with no abuse.  Negative REMS.   Depression, major, recurrent, mild ([place]) Depression remains mild  Persistent atrial fibrillation [facility]) Patient has a diagnosis of chronic atrial fibrillation.   Patient is on plavix and has controlled ventricular response.  Patient is CV stable .  Chronic constipation -     lactulose (CHRONULAC) 10 GM/15ML solution; Take 15 mLs (10 g total) by mouth daily as needed for mild constipation. Patient is having chronic constipation with pain medicines.  Type 1 diabetes mellitus with stage 3b chronic kidney disease ([place]) Patient has chronic diabetes with renal failure, retinopathy and neuropathy.  Anoxic brain injury ([place]) From PEA at [place], she was resuscitated with diffuse hypoxic injury to brain- as on RI  Cognitive communication deficit Patient continues to have memory problems from hypoxic brain injury.  Impaired mobility and ADLs  Patient has continues right sided weakness.  She uses cane but should consider walker.      Follow-up: Return with Dr. Tobie Poet at regular visit.    Reinaldo Meeker, MD

## 2021-02-13 NOTE — Telephone Encounter (Signed)
Appointment cancelled

## 2021-02-13 NOTE — Telephone Encounter (Signed)
Pt called and states that she has been in the hospital for a month.She is going to have to cancel her appt tomorrow until she can drive again.   CB [phone]

## 2021-02-14 ENCOUNTER — Encounter: Payer: HMO | Admitting: Physical Medicine and Rehabilitation

## 2021-02-15 DIAGNOSIS — E1122 Type 2 diabetes mellitus with diabetic chronic kidney disease: Secondary | ICD-10-CM | POA: Diagnosis not present

## 2021-02-15 DIAGNOSIS — N2581 Secondary hyperparathyroidism of renal origin: Secondary | ICD-10-CM | POA: Diagnosis not present

## 2021-02-15 DIAGNOSIS — N1832 Chronic kidney disease, stage 3b: Secondary | ICD-10-CM | POA: Diagnosis not present

## 2021-02-15 DIAGNOSIS — Z8674 Personal history of sudden cardiac arrest: Secondary | ICD-10-CM

## 2021-02-15 DIAGNOSIS — G4733 Obstructive sleep apnea (adult) (pediatric): Secondary | ICD-10-CM

## 2021-02-15 DIAGNOSIS — K219 Gastro-esophageal reflux disease without esophagitis: Secondary | ICD-10-CM | POA: Diagnosis not present

## 2021-02-15 DIAGNOSIS — I5030 Unspecified diastolic (congestive) heart failure: Secondary | ICD-10-CM | POA: Diagnosis not present

## 2021-02-15 DIAGNOSIS — E1142 Type 2 diabetes mellitus with diabetic polyneuropathy: Secondary | ICD-10-CM | POA: Diagnosis not present

## 2021-02-15 DIAGNOSIS — I130 Hypertensive heart and chronic kidney disease with heart failure and stage 1 through stage 4 chronic kidney disease, or unspecified chronic kidney disease: Secondary | ICD-10-CM | POA: Diagnosis not present

## 2021-02-15 DIAGNOSIS — D631 Anemia in chronic kidney disease: Secondary | ICD-10-CM | POA: Diagnosis not present

## 2021-02-15 DIAGNOSIS — I2510 Atherosclerotic heart disease of native coronary artery without angina pectoris: Secondary | ICD-10-CM | POA: Diagnosis not present

## 2021-02-15 DIAGNOSIS — G931 Anoxic brain damage, not elsewhere classified: Secondary | ICD-10-CM | POA: Diagnosis not present

## 2021-02-15 DIAGNOSIS — E782 Mixed hyperlipidemia: Secondary | ICD-10-CM | POA: Diagnosis not present

## 2021-02-15 DIAGNOSIS — J449 Chronic obstructive pulmonary disease, unspecified: Secondary | ICD-10-CM | POA: Diagnosis not present

## 2021-02-15 DIAGNOSIS — D509 Iron deficiency anemia, unspecified: Secondary | ICD-10-CM

## 2021-02-20 ENCOUNTER — Encounter (HOSPITAL_COMMUNITY): Payer: HMO

## 2021-02-21 DIAGNOSIS — I130 Hypertensive heart and chronic kidney disease with heart failure and stage 1 through stage 4 chronic kidney disease, or unspecified chronic kidney disease: Secondary | ICD-10-CM | POA: Diagnosis not present

## 2021-02-21 DIAGNOSIS — Z8674 Personal history of sudden cardiac arrest: Secondary | ICD-10-CM | POA: Diagnosis not present

## 2021-02-21 DIAGNOSIS — Z794 Long term (current) use of insulin: Secondary | ICD-10-CM | POA: Diagnosis not present

## 2021-02-21 DIAGNOSIS — G931 Anoxic brain damage, not elsewhere classified: Secondary | ICD-10-CM | POA: Diagnosis not present

## 2021-02-21 DIAGNOSIS — N2581 Secondary hyperparathyroidism of renal origin: Secondary | ICD-10-CM | POA: Diagnosis not present

## 2021-02-21 DIAGNOSIS — K219 Gastro-esophageal reflux disease without esophagitis: Secondary | ICD-10-CM | POA: Diagnosis not present

## 2021-02-21 DIAGNOSIS — Z79899 Other long term (current) drug therapy: Secondary | ICD-10-CM | POA: Diagnosis not present

## 2021-02-21 DIAGNOSIS — E1142 Type 2 diabetes mellitus with diabetic polyneuropathy: Secondary | ICD-10-CM | POA: Diagnosis not present

## 2021-02-21 DIAGNOSIS — D509 Iron deficiency anemia, unspecified: Secondary | ICD-10-CM | POA: Diagnosis not present

## 2021-02-21 DIAGNOSIS — E782 Mixed hyperlipidemia: Secondary | ICD-10-CM | POA: Diagnosis not present

## 2021-02-21 DIAGNOSIS — D631 Anemia in chronic kidney disease: Secondary | ICD-10-CM | POA: Diagnosis not present

## 2021-02-21 DIAGNOSIS — Z79891 Long term (current) use of opiate analgesic: Secondary | ICD-10-CM | POA: Diagnosis not present

## 2021-02-21 DIAGNOSIS — N1832 Chronic kidney disease, stage 3b: Secondary | ICD-10-CM | POA: Diagnosis not present

## 2021-02-21 DIAGNOSIS — G4733 Obstructive sleep apnea (adult) (pediatric): Secondary | ICD-10-CM | POA: Diagnosis not present

## 2021-02-21 DIAGNOSIS — E1122 Type 2 diabetes mellitus with diabetic chronic kidney disease: Secondary | ICD-10-CM | POA: Diagnosis not present

## 2021-02-21 DIAGNOSIS — I5030 Unspecified diastolic (congestive) heart failure: Secondary | ICD-10-CM | POA: Diagnosis not present

## 2021-02-21 DIAGNOSIS — Z7902 Long term (current) use of antithrombotics/antiplatelets: Secondary | ICD-10-CM | POA: Diagnosis not present

## 2021-02-21 DIAGNOSIS — I2510 Atherosclerotic heart disease of native coronary artery without angina pectoris: Secondary | ICD-10-CM | POA: Diagnosis not present

## 2021-02-21 DIAGNOSIS — J449 Chronic obstructive pulmonary disease, unspecified: Secondary | ICD-10-CM | POA: Diagnosis not present

## 2021-02-23 ENCOUNTER — Other Ambulatory Visit: Payer: Self-pay

## 2021-02-23 MED ORDER — HYDROCODONE-ACETAMINOPHEN 7.5-325 MG PO TABS: 1.0000 | ORAL_TABLET | Freq: Three times a day (TID) | ORAL | 0 refills | Status: DC | PRN

## 2021-02-28 ENCOUNTER — Other Ambulatory Visit: Payer: Self-pay

## 2021-02-28 ENCOUNTER — Other Ambulatory Visit: Payer: HMO

## 2021-02-28 DIAGNOSIS — N2581 Secondary hyperparathyroidism of renal origin: Secondary | ICD-10-CM | POA: Diagnosis not present

## 2021-02-28 DIAGNOSIS — N1832 Chronic kidney disease, stage 3b: Secondary | ICD-10-CM

## 2021-02-28 DIAGNOSIS — Z794 Long term (current) use of insulin: Secondary | ICD-10-CM | POA: Diagnosis not present

## 2021-02-28 DIAGNOSIS — Z79891 Long term (current) use of opiate analgesic: Secondary | ICD-10-CM | POA: Diagnosis not present

## 2021-02-28 DIAGNOSIS — G4733 Obstructive sleep apnea (adult) (pediatric): Secondary | ICD-10-CM | POA: Diagnosis not present

## 2021-02-28 DIAGNOSIS — J449 Chronic obstructive pulmonary disease, unspecified: Secondary | ICD-10-CM | POA: Diagnosis not present

## 2021-02-28 DIAGNOSIS — K219 Gastro-esophageal reflux disease without esophagitis: Secondary | ICD-10-CM | POA: Diagnosis not present

## 2021-02-28 DIAGNOSIS — E1142 Type 2 diabetes mellitus with diabetic polyneuropathy: Secondary | ICD-10-CM | POA: Diagnosis not present

## 2021-02-28 DIAGNOSIS — Z7902 Long term (current) use of antithrombotics/antiplatelets: Secondary | ICD-10-CM | POA: Diagnosis not present

## 2021-02-28 DIAGNOSIS — I130 Hypertensive heart and chronic kidney disease with heart failure and stage 1 through stage 4 chronic kidney disease, or unspecified chronic kidney disease: Secondary | ICD-10-CM | POA: Diagnosis not present

## 2021-02-28 DIAGNOSIS — E559 Vitamin D deficiency, unspecified: Secondary | ICD-10-CM

## 2021-02-28 DIAGNOSIS — E782 Mixed hyperlipidemia: Secondary | ICD-10-CM | POA: Diagnosis not present

## 2021-02-28 DIAGNOSIS — Z79899 Other long term (current) drug therapy: Secondary | ICD-10-CM | POA: Diagnosis not present

## 2021-02-28 DIAGNOSIS — D509 Iron deficiency anemia, unspecified: Secondary | ICD-10-CM | POA: Diagnosis not present

## 2021-02-28 DIAGNOSIS — D631 Anemia in chronic kidney disease: Secondary | ICD-10-CM | POA: Diagnosis not present

## 2021-02-28 DIAGNOSIS — Z8674 Personal history of sudden cardiac arrest: Secondary | ICD-10-CM | POA: Diagnosis not present

## 2021-02-28 DIAGNOSIS — G931 Anoxic brain damage, not elsewhere classified: Secondary | ICD-10-CM | POA: Diagnosis not present

## 2021-02-28 DIAGNOSIS — I5030 Unspecified diastolic (congestive) heart failure: Secondary | ICD-10-CM | POA: Diagnosis not present

## 2021-02-28 DIAGNOSIS — E1122 Type 2 diabetes mellitus with diabetic chronic kidney disease: Secondary | ICD-10-CM | POA: Diagnosis not present

## 2021-02-28 DIAGNOSIS — I2510 Atherosclerotic heart disease of native coronary artery without angina pectoris: Secondary | ICD-10-CM | POA: Diagnosis not present

## 2021-03-01 DIAGNOSIS — G4733 Obstructive sleep apnea (adult) (pediatric): Secondary | ICD-10-CM | POA: Diagnosis not present

## 2021-03-01 DIAGNOSIS — Z8674 Personal history of sudden cardiac arrest: Secondary | ICD-10-CM | POA: Diagnosis not present

## 2021-03-01 DIAGNOSIS — G931 Anoxic brain damage, not elsewhere classified: Secondary | ICD-10-CM | POA: Diagnosis not present

## 2021-03-01 DIAGNOSIS — I5032 Chronic diastolic (congestive) heart failure: Secondary | ICD-10-CM | POA: Diagnosis not present

## 2021-03-01 DIAGNOSIS — E1022 Type 1 diabetes mellitus with diabetic chronic kidney disease: Secondary | ICD-10-CM | POA: Diagnosis not present

## 2021-03-01 DIAGNOSIS — Z9989 Dependence on other enabling machines and devices: Secondary | ICD-10-CM | POA: Diagnosis not present

## 2021-03-01 DIAGNOSIS — N1832 Chronic kidney disease, stage 3b: Secondary | ICD-10-CM | POA: Diagnosis not present

## 2021-03-01 DIAGNOSIS — E559 Vitamin D deficiency, unspecified: Secondary | ICD-10-CM | POA: Diagnosis not present

## 2021-03-01 DIAGNOSIS — I130 Hypertensive heart and chronic kidney disease with heart failure and stage 1 through stage 4 chronic kidney disease, or unspecified chronic kidney disease: Secondary | ICD-10-CM | POA: Diagnosis not present

## 2021-03-01 DIAGNOSIS — D631 Anemia in chronic kidney disease: Secondary | ICD-10-CM | POA: Diagnosis not present

## 2021-03-01 LAB — CBC WITH DIFFERENTIAL/PLATELET
Basophils Absolute: 0.1 10*3/uL (ref 0.0–0.2)
Basos: 1 %
EOS (ABSOLUTE): 0.2 10*3/uL (ref 0.0–0.4)
Eos: 2 %
Hematocrit: 26.4 % — ABNORMAL LOW (ref 34.0–46.6)
Hemoglobin: 8.3 g/dL — ABNORMAL LOW (ref 11.1–15.9)
Immature Grans (Abs): 0 10*3/uL (ref 0.0–0.1)
Immature Granulocytes: 0 %
Lymphocytes Absolute: 1.9 10*3/uL (ref 0.7–3.1)
Lymphs: 18 %
MCH: 27.1 pg (ref 26.6–33.0)
MCHC: 31.4 g/dL — ABNORMAL LOW (ref 31.5–35.7)
MCV: 86 fL (ref 79–97)
Monocytes Absolute: 0.8 10*3/uL (ref 0.1–0.9)
Monocytes: 7 %
Neutrophils Absolute: 8 10*3/uL — ABNORMAL HIGH (ref 1.4–7.0)
Neutrophils: 72 %
Platelets: 325 10*3/uL (ref 150–450)
RBC: 3.06 x10E6/uL — ABNORMAL LOW (ref 3.77–5.28)
RDW: 14.2 % (ref 11.7–15.4)
WBC: 11 10*3/uL — ABNORMAL HIGH (ref 3.4–10.8)

## 2021-03-01 LAB — COMPREHENSIVE METABOLIC PANEL
ALT: 16 IU/L (ref 0–32)
AST: 18 IU/L (ref 0–40)
Albumin/Globulin Ratio: 1.3 (ref 1.2–2.2)
Albumin: 3.9 g/dL (ref 3.8–4.9)
Alkaline Phosphatase: 151 IU/L — ABNORMAL HIGH (ref 44–121)
BUN/Creatinine Ratio: 20 (ref 9–23)
BUN: 31 mg/dL — ABNORMAL HIGH (ref 6–24)
Bilirubin Total: 0.3 mg/dL (ref 0.0–1.2)
CO2: 22 mmol/L (ref 20–29)
Calcium: 9.8 mg/dL (ref 8.7–10.2)
Chloride: 104 mmol/L (ref 96–106)
Creatinine, Ser: 1.57 mg/dL — ABNORMAL HIGH (ref 0.57–1.00)
Globulin, Total: 3 g/dL (ref 1.5–4.5)
Glucose: 140 mg/dL — ABNORMAL HIGH (ref 65–99)
Potassium: 4.9 mmol/L (ref 3.5–5.2)
Sodium: 141 mmol/L (ref 134–144)
Total Protein: 6.9 g/dL (ref 6.0–8.5)
eGFR: 38 mL/min/{1.73_m2} — ABNORMAL LOW (ref >59)

## 2021-03-01 LAB — PTH, INTACT AND CALCIUM: PTH: 56 pg/mL (ref 15–65)

## 2021-03-01 LAB — VITAMIN D 25 HYDROXY (VIT D DEFICIENCY, FRACTURES): Vit D, 25-Hydroxy: 26 ng/mL — ABNORMAL LOW (ref 30.0–100.0)

## 2021-03-03 ENCOUNTER — Other Ambulatory Visit: Payer: Self-pay

## 2021-03-03 MED ORDER — ALLOPURINOL 100 MG PO TABS: 100.0000 mg | ORAL_TABLET | Freq: Every day | ORAL | 0 refills | Status: DC

## 2021-03-03 NOTE — Telephone Encounter (Signed)
Refill sent to pharmacy.   

## 2021-03-06 ENCOUNTER — Other Ambulatory Visit: Payer: Self-pay

## 2021-03-06 DIAGNOSIS — I5030 Unspecified diastolic (congestive) heart failure: Secondary | ICD-10-CM | POA: Diagnosis not present

## 2021-03-06 DIAGNOSIS — G931 Anoxic brain damage, not elsewhere classified: Secondary | ICD-10-CM | POA: Diagnosis not present

## 2021-03-06 DIAGNOSIS — I130 Hypertensive heart and chronic kidney disease with heart failure and stage 1 through stage 4 chronic kidney disease, or unspecified chronic kidney disease: Secondary | ICD-10-CM | POA: Diagnosis not present

## 2021-03-06 DIAGNOSIS — E1142 Type 2 diabetes mellitus with diabetic polyneuropathy: Secondary | ICD-10-CM | POA: Diagnosis not present

## 2021-03-06 DIAGNOSIS — Z8674 Personal history of sudden cardiac arrest: Secondary | ICD-10-CM | POA: Diagnosis not present

## 2021-03-06 DIAGNOSIS — K219 Gastro-esophageal reflux disease without esophagitis: Secondary | ICD-10-CM | POA: Diagnosis not present

## 2021-03-06 DIAGNOSIS — Z794 Long term (current) use of insulin: Secondary | ICD-10-CM | POA: Diagnosis not present

## 2021-03-06 DIAGNOSIS — Z79891 Long term (current) use of opiate analgesic: Secondary | ICD-10-CM | POA: Diagnosis not present

## 2021-03-06 DIAGNOSIS — Z79899 Other long term (current) drug therapy: Secondary | ICD-10-CM | POA: Diagnosis not present

## 2021-03-06 DIAGNOSIS — I2510 Atherosclerotic heart disease of native coronary artery without angina pectoris: Secondary | ICD-10-CM | POA: Diagnosis not present

## 2021-03-06 DIAGNOSIS — J449 Chronic obstructive pulmonary disease, unspecified: Secondary | ICD-10-CM | POA: Diagnosis not present

## 2021-03-06 DIAGNOSIS — Z7902 Long term (current) use of antithrombotics/antiplatelets: Secondary | ICD-10-CM | POA: Diagnosis not present

## 2021-03-06 DIAGNOSIS — D631 Anemia in chronic kidney disease: Secondary | ICD-10-CM | POA: Diagnosis not present

## 2021-03-06 DIAGNOSIS — N2581 Secondary hyperparathyroidism of renal origin: Secondary | ICD-10-CM | POA: Diagnosis not present

## 2021-03-06 DIAGNOSIS — G4733 Obstructive sleep apnea (adult) (pediatric): Secondary | ICD-10-CM | POA: Diagnosis not present

## 2021-03-06 DIAGNOSIS — N1832 Chronic kidney disease, stage 3b: Secondary | ICD-10-CM | POA: Diagnosis not present

## 2021-03-06 DIAGNOSIS — D509 Iron deficiency anemia, unspecified: Secondary | ICD-10-CM | POA: Diagnosis not present

## 2021-03-06 DIAGNOSIS — E782 Mixed hyperlipidemia: Secondary | ICD-10-CM | POA: Diagnosis not present

## 2021-03-06 DIAGNOSIS — E1122 Type 2 diabetes mellitus with diabetic chronic kidney disease: Secondary | ICD-10-CM | POA: Diagnosis not present

## 2021-03-07 DIAGNOSIS — I10 Essential (primary) hypertension: Secondary | ICD-10-CM | POA: Diagnosis not present

## 2021-03-07 DIAGNOSIS — E119 Type 2 diabetes mellitus without complications: Secondary | ICD-10-CM | POA: Diagnosis not present

## 2021-03-07 DIAGNOSIS — E785 Hyperlipidemia, unspecified: Secondary | ICD-10-CM | POA: Diagnosis not present

## 2021-03-07 DIAGNOSIS — G931 Anoxic brain damage, not elsewhere classified: Secondary | ICD-10-CM | POA: Diagnosis not present

## 2021-03-07 DIAGNOSIS — R531 Weakness: Secondary | ICD-10-CM | POA: Diagnosis not present

## 2021-03-08 ENCOUNTER — Telehealth: Payer: Self-pay

## 2021-03-08 NOTE — Telephone Encounter (Signed)
Dana [name] from [facility] Speech therapist called and was requesting 1 additional visit for speech therapy and needed the ok order for her to go out next week to see the patient. Per Dr. [name] the ok was given and it is ok for her to go out and was informed. [place]

## 2021-03-09 ENCOUNTER — Telehealth (HOSPITAL_COMMUNITY): Payer: Self-pay | Admitting: Cardiology

## 2021-03-09 DIAGNOSIS — I249 Acute ischemic heart disease, unspecified: Secondary | ICD-10-CM | POA: Diagnosis not present

## 2021-03-09 DIAGNOSIS — I509 Heart failure, unspecified: Secondary | ICD-10-CM | POA: Diagnosis not present

## 2021-03-09 MED ORDER — TORSEMIDE 20 MG PO TABS: 20.0000 mg | ORAL_TABLET | Freq: Every day | ORAL | 3 refills | Status: DC

## 2021-03-09 NOTE — Telephone Encounter (Signed)
[facility] RN called with patient on the phone for hospital stay review. Would like to clarify diuretic. Reports she was switched from Torsemide to Furosemide, currently taking 20 mg daily. Was advised to only take for 30 days.  Cardio mems reading 17 and goal is set for 10 per Darrick Grinder, NP stop lasix restart torsemide 20 mg daily. Keep followup as scheduled 8/31  Pt aware and HTA,Rn aware

## 2021-03-10 DIAGNOSIS — E782 Mixed hyperlipidemia: Secondary | ICD-10-CM | POA: Diagnosis not present

## 2021-03-10 DIAGNOSIS — Z794 Long term (current) use of insulin: Secondary | ICD-10-CM | POA: Diagnosis not present

## 2021-03-10 DIAGNOSIS — I2510 Atherosclerotic heart disease of native coronary artery without angina pectoris: Secondary | ICD-10-CM | POA: Diagnosis not present

## 2021-03-10 DIAGNOSIS — I5030 Unspecified diastolic (congestive) heart failure: Secondary | ICD-10-CM | POA: Diagnosis not present

## 2021-03-10 DIAGNOSIS — N2581 Secondary hyperparathyroidism of renal origin: Secondary | ICD-10-CM | POA: Diagnosis not present

## 2021-03-10 DIAGNOSIS — Z8674 Personal history of sudden cardiac arrest: Secondary | ICD-10-CM | POA: Diagnosis not present

## 2021-03-10 DIAGNOSIS — D631 Anemia in chronic kidney disease: Secondary | ICD-10-CM | POA: Diagnosis not present

## 2021-03-10 DIAGNOSIS — J449 Chronic obstructive pulmonary disease, unspecified: Secondary | ICD-10-CM | POA: Diagnosis not present

## 2021-03-10 DIAGNOSIS — E1122 Type 2 diabetes mellitus with diabetic chronic kidney disease: Secondary | ICD-10-CM | POA: Diagnosis not present

## 2021-03-10 DIAGNOSIS — E1142 Type 2 diabetes mellitus with diabetic polyneuropathy: Secondary | ICD-10-CM | POA: Diagnosis not present

## 2021-03-10 DIAGNOSIS — I130 Hypertensive heart and chronic kidney disease with heart failure and stage 1 through stage 4 chronic kidney disease, or unspecified chronic kidney disease: Secondary | ICD-10-CM | POA: Diagnosis not present

## 2021-03-10 DIAGNOSIS — Z7902 Long term (current) use of antithrombotics/antiplatelets: Secondary | ICD-10-CM | POA: Diagnosis not present

## 2021-03-10 DIAGNOSIS — Z79891 Long term (current) use of opiate analgesic: Secondary | ICD-10-CM | POA: Diagnosis not present

## 2021-03-10 DIAGNOSIS — Z79899 Other long term (current) drug therapy: Secondary | ICD-10-CM | POA: Diagnosis not present

## 2021-03-10 DIAGNOSIS — G931 Anoxic brain damage, not elsewhere classified: Secondary | ICD-10-CM | POA: Diagnosis not present

## 2021-03-10 DIAGNOSIS — D509 Iron deficiency anemia, unspecified: Secondary | ICD-10-CM | POA: Diagnosis not present

## 2021-03-10 DIAGNOSIS — N1832 Chronic kidney disease, stage 3b: Secondary | ICD-10-CM | POA: Diagnosis not present

## 2021-03-10 DIAGNOSIS — G4733 Obstructive sleep apnea (adult) (pediatric): Secondary | ICD-10-CM | POA: Diagnosis not present

## 2021-03-10 DIAGNOSIS — K219 Gastro-esophageal reflux disease without esophagitis: Secondary | ICD-10-CM | POA: Diagnosis not present

## 2021-03-12 DIAGNOSIS — I249 Acute ischemic heart disease, unspecified: Secondary | ICD-10-CM | POA: Diagnosis not present

## 2021-03-12 DIAGNOSIS — I509 Heart failure, unspecified: Secondary | ICD-10-CM | POA: Diagnosis not present

## 2021-03-13 DIAGNOSIS — E103593 Type 1 diabetes mellitus with proliferative diabetic retinopathy without macular edema, bilateral: Secondary | ICD-10-CM | POA: Diagnosis not present

## 2021-03-14 NOTE — Progress Notes (Signed)
Advanced Heart Failure Clinic Note   PCP: [name], MD PCP-Cardiologist: [name], MD   Reason for Visit: [facility] F/u s/p PEA arrest  HPI:  Ms Loos is 60 y.o. female with history of diastolic heart failure, CAD, DES RCA/LAD 2018, DMI, htn, hyperlipidemia, PAD, and OSA on CPAP, PAF s/p ablation, PAD s/p L great toe and 4th toe amputation.  Echo 5/21 EF 60-65% RV ok. Grade II DD.   Zio patch 02/2018 (for possible syncope) with no arrhythmias, only occasional PACs/PVCs.   She is s/p Cardiomems placement (8/19).   Admitted 08/29/20 with chest pain and increased shortness of breath. CTA negative for PE. HS Trop 41>66. Initially thought to be from demand ischemia. Had chest pain again with HS Trop up to  1917. Placed on heparin + Ntg drip and diuresed with IV lasix. Repeat echo (2/22) EF 55-60%. Cath was not pursued with creatinine 3.4. Hospital course complicated by AKI and anemia. SCr down to 2.71 on discharge, weight 253 lbs.   09/29/20 Patient awoke during the night with sharp chest pain and shortness of breath. She presented with ST depressions in V4 and V5, HS troponin that increased from 60>239, and CXR consistent with pulmonary edema.  Additionally she had a leukocytosis on admission which was felt to be reactive in the setting of NSTEMI.   She was started on nitro and heparin drips with improvement in symptoms.  She underwent LHC (3/22) showing 3 vessel CAD with moderate mid-LAD & mid-RCA stenosis and mid-L-Cx stenosis and moderately elevated LVEDP. S/p successful PCI of mid-circ with DES and side branch angioplasty of OM1.   Unfortunately she developed a contrast-induced nephropathy after her caht and her creatinine went from 2.2 up to a peak of 5.5.  She remained volume overloaded at this time.  Additionally, she developed severe nausea and vomiting, gastroparesis and required IV antiplatelet therapy.  While on this therapy she developed epistaxis and acute blood loss  anemia requiring transfusion.  She was given promotility agents given her history of gastroparesis her nausea and vomiting improved.  Epistaxis was treated with nasal Afrin.  She was switched back to oral DAPT. During this time diuretics were reintroduced and her creatinine began to improve over the following days. Her volume status improved significantly, she no longer had bleeding, remained chest pain free and had no further nausea and vomiting.  Her leukocytosis persisted but all her infectious workup was negative and this was felt to be reactive.  She was discharged on 10/06/20.   Post hospital doing well. SCr stable at 1.81. Hgb stable at 8.7. CardioMEMs: Goal dPAP: 10  dPAP: 5 (volume control has been good, persistently below goal). Losing weight and doing CR.  Per chart review, she was admitted 6/22 to [facility] for DKA, started on insulin gtt, IVF and diuretics held. Developed pulmonary vascular congestion vs PNA and was started on IV abx, and diuretics were resumed. She suffered a PEA arrest 01/12/21 with ROSC after 10-15 minutes and was transferred to higher level of care at [facility]; precipitating factor for PEA arrest thought to be respiratory driven. On arrival, she was still in DKA, AKI on CKD with oliguria and Nephrology was consulted for further management. TTE with EF 60%, min MR/TR. Concern for stroke due to left-sided weakness and MRI confirmed anoxic brain injury, but no definite infarct. She was extubated, SCr normalized to baseline of 1.5, and she was discharged to rehab facility.  Today she returns for post hospitalization HF follow up  with her daughter. No significant dyspnea working on flat ground. Doing PT/OT/SLP. Some memory issues but daughter says she is much better. Some swelling. Denies CP, dizziness, or PND/Orthopnea. Appetite ok. No fever or chills. Weight at home 237 pounds. Taking all medications.    Cardiac Studies: - Echo (5/21): EF 60-65%, RV ok, Grade II DD. - Echo (2/22):  EF 55-60%, RV ok, Grade II DD, mod MR - Echo (3/22): EF 55-60%, Grade II DD, normal RV - Echo (6/22): EF 60%, mod MR/TR (per chart review at [facility]). - Zio (8/19): no arrhythmias, occasional PAC/PVCs  - LHC (3/22): 3 vessel CAD with moderate mid-LAD and mid-RCA stenoses, and severe mid-LCx stenosis; successful PCI of the mid-circumflex, reducing severe 90% stenosis to 0% with a 2.75x22 mm Resolute Onyx DES, provisional side branch angioplasty of the OM1 with a 2.5 mm balloon through the stent struts  Review of systems complete and found to be negative unless listed in HPI.    Past Medical History:  Diagnosis Date   Acquired absence of left great toe [facility])    Acquired absence of other left toe(s) (HCC)    Anginal pain ([place])    Atherosclerotic heart disease of native coronary artery without angina pectoris    CAD (coronary artery disease)    08/2016 DES to RCA, also with history of LAD stenting.    Chronic diastolic (congestive) heart failure (HCC)    Chronic kidney disease, stage 2 (mild)    Chronic systolic (congestive) heart failure (HCC)    Contrast dye induced nephropathy, possible 09/03/2020   Diabetes (HCC)    Diastolic CHF (HCC)    Gastro-esophageal reflux disease without esophagitis    Hyperlipidemia    Hypertension    Hypertensive heart disease with heart failure (HCC)    Hypoxemia    Idiopathic gout, multiple sites    Localized edema    Low back pain    Mixed hyperlipidemia    Mixed incontinence    Morbid (severe) obesity due to excess calories (HCC)    Non-ST elevation (NSTEMI) myocardial infarction ([place]) 09/03/2020   OSA (obstructive sleep apnea)    OSA (obstructive sleep apnea) 09/03/2020   Other specified hypothyroidism    Primary insomnia    Type 2 diabetes mellitus with complication, without long-term current use of insulin ([place]) 09/03/2020   Type 2 diabetes mellitus with diabetic nephropathy (HCC)     Current Outpatient Medications  Medication Sig Dispense  Refill   allopurinol (ZYLOPRIM) 100 MG tablet Take 1 tablet (100 mg total) by mouth daily. 90 tablet 0   aspirin 81 MG chewable tablet Chew 81 mg by mouth daily.     BD PEN NEEDLE NANO 2ND GEN 32G X 4 MM MISC 4 (four) times daily.     carvedilol (COREG) 12.5 MG tablet Take 0.5 tablets (6.25 mg total) by mouth 2 (two) times daily. 60 tablet 11   Cholecalciferol (VITAMIN D) 50 MCG (2000 UT) tablet Take 2,000 Units by mouth daily. Take one daily     clopidogrel (PLAVIX) 75 MG tablet Take 1 tablet (75 mg total) by mouth at bedtime. 90 tablet 3   Cyanocobalamin 3000 MCG CAPS Take 2 capsules by mouth daily. Patient purchased 3000 mcg gummies and taking 2 gummies daily     EPINEPHrine 0.3 mg/0.3 mL IJ SOAJ injection Use as directed for life-threatening allergic reaction. 4 Device 3   Evolocumab (REPATHA SURECLICK) 774 MG/ML SOAJ Inject 1 Dose into the skin every 14 (fourteen) days. 6 mL  3   ezetimibe (ZETIA) 10 MG tablet TAKE 1 TABLET BY MOUTH  DAILY 90 tablet 3   famotidine (PEPCID) 20 MG tablet Take 1 tablet (20 mg total) by mouth daily. 30 tablet 5   ferrous sulfate 325 (65 FE) MG tablet Take 325 mg by mouth 2 (two) times daily with a meal.     hydrALAZINE (APRESOLINE) 25 MG tablet TAKE 1 TABLET BY MOUTH THREE TIMES DAILY 90 tablet 0   HYDROcodone-acetaminophen (NORCO) 7.5-325 MG tablet Take 1 tablet by mouth 3 (three) times daily as needed for moderate pain. 90 tablet 0   insulin aspart (NOVOLOG) 100 UNIT/ML injection Inject 2-10 Units into the skin 3 (three) times daily before meals.     isosorbide mononitrate (IMDUR) 30 MG 24 hr tablet Take 1 tablet by mouth once daily 30 tablet 0   lactulose (CHRONULAC) 10 GM/15ML solution Take 15 mLs (10 g total) by mouth daily as needed for mild constipation. 236 mL 0   latanoprost (XALATAN) 0.005 % ophthalmic solution Place 1 drop into both eyes at bedtime.     LEVEMIR FLEXTOUCH 100 UNIT/ML FlexPen Inject 0-50 Units into the skin daily.     LORazepam (ATIVAN)  0.5 MG tablet Take 1 tablet (0.5 mg total) by mouth daily as needed for anxiety. 30 tablet 2   magnesium oxide (MAG-OX) 400 MG tablet Take 2 tablets (800 mg total) by mouth daily. Take 2 (400 mg) daily=800 mg 60 tablet 1   nitroGLYCERIN (NITROSTAT) 0.4 MG SL tablet Place 1 tablet (0.4 mg total) under the tongue every 5 (five) minutes as needed for chest pain. 15 tablet 2   Omega-3 Fatty Acids (FISH OIL PO) Take 1 capsule by mouth daily.     OXYGEN Inhale 2 L into the lungs at bedtime.     pantoprazole (PROTONIX) 40 MG tablet Take 1 tablet (40 mg total) by mouth daily. 30 tablet 3   Probiotic CAPS Take 1 capsule by mouth daily.     promethazine (PHENERGAN) 25 MG tablet Take 1 tablet (25 mg total) by mouth every 6 (six) hours as needed for nausea or vomiting. 90 tablet 1   torsemide (DEMADEX) 20 MG tablet Take 1 tablet (20 mg total) by mouth daily. 30 tablet 3   No current facility-administered medications for this encounter.   Allergies  Allergen Reactions   Naproxen Hives and Rash   Celecoxib Other (See Comments)    Stomach pain   Aspirin Other (See Comments)    CAN NOT TAKE DUE TO ULCERS (documented previously) but takes every day currently (as of 09/29/20) without reported issues   Glucosamine Hives, Swelling and Other (See Comments)    Angioedema    Iron     IV iron transfusion - hives - Feb 2022 hospitalization   Metoclopramide Other (See Comments)    Facial twitching and stuttering   Metolazone Other (See Comments)    Acute renal failure   Shellfish-Derived Products Hives, Swelling and Other (See Comments)    Angioedema    Tramadol Nausea And Vomiting        Social History   Socioeconomic History   Marital status: Married    Spouse name: Not on file   Number of children: Not on file   Years of education: Not on file   Highest education level: Not on file  Occupational History   Not on file  Tobacco Use   Smoking status: Former   Smokeless tobacco: Never  Vaping Use  Vaping Use: Never used  Substance and Sexual Activity   Alcohol use: Not Currently   Drug use: Not Currently   Sexual activity: Not on file  Other Topics Concern   Not on file  Social History Narrative   Not on file   Social Determinants of Health   Financial Resource Strain: Not on file  Food Insecurity: No Food Insecurity   Worried About Running Out of Food in the Last Year: Never true   Ran Out of Food in the Last Year: Never true  Transportation Needs: No Transportation Needs   Lack of Transportation (Medical): No   Lack of Transportation (Non-Medical): No  Physical Activity: Not on file  Stress: Not on file  Social Connections: Not on file  Intimate Partner Violence: Not on file   Family History  Problem Relation Age of Onset   Hypertension Father    Coronary artery disease Mother    Hypertension Mother    Hyperlipidemia Mother    Diabetes type II Mother    Breast cancer Mother    Lung cancer Mother    Bipolar disorder Other    Depression Other    Schizophrenia Other    BP (!) 174/62   Pulse 74   Wt 106.1 kg (233 lb 12.8 oz)   SpO2 100%   BMI 38.91 kg/m   Wt Readings from Last 3 Encounters:  03/15/21 106.1 kg (233 lb 12.8 oz)  02/13/21 108.1 kg (238 lb 6.4 oz)  01/04/21 108.9 kg (240 lb)   PHYSICAL EXAM: General:  NAD. No resp difficulty HEENT: Normal Neck: Supple. No JVD. Carotids 2+ bilat; no bruits. No lymphadenopathy or thryomegaly appreciated. Cor: PMI nondisplaced. Regular rate & rhythm. No rubs, gallops or murmurs. Lungs: Clear Abdomen: Obese, nontender, nondistended. No hepatosplenomegaly. No bruits or masses. Good bowel sounds. Extremities: No cyanosis, clubbing, rash, edema; right wrist brace in place. Neuro: Alert & oriented x 3, cranial nerves grossly intact. Moves all 4 extremities w/o difficulty. Affect pleasant.  ECG: SR 78 bpm (personally reviewed).  CardioMEMs: Goal dPAP: 10  Today's dPAP: 11   ASSESSMENT & PLAN:  1. CAD -  Known CAD with prior stents to mid LAD and pRCA.  - NSTEMI (2/22). Cath deferred due to AKI. Treated medically.  - NSTEMI (7/22)-->LHC and received PCI/DES to mid circ, balloon angioplasty to OM1  - No s/s ischemia. - Continue ASA + Plavix for a minimum of 12 months. - Lipids under good control w/ Repatha + Zetia (LDL 66 7/22). Followed by Dr. Debara Pickett.    2. Chronic Diastolic Heart Failure - Echo 08/2020 EF 55-60%  - Echo (3/22): EF 55-60%, Grade II DD, normal RV - Echo (6/22): EF 60%, mod MR/TR (per chart review at [place]). - NYHA Class II, she does not appear volume overloaded but CardioMEMS mildly elevated. - Increase torsemide to 40 mg daily x 3 days, then back to 20 mg daily. - With elevated BP, increase carvedilol to 12.5 mg bid. - Continue hydralazine 25 mg tid + Imdur 30 mg daily. - No SGLT2i with T1DM. - BMET today, repeat in 10 days.  3. CKD IV w/ CIN  - Followed by nephrology. - SCr baseline 1.5. - BMET today.   4. Palpitations - None of ECG today. - Better, but still feels them. - Increase carvedilol as above. - place Zio XT. If Afib, will need anticoagulate. Discussed with Dr. [name].  5. Anoxic brain injury - 2/2 PEA arrest, ? respiratory driven. - Memory improving.  -  Continue SLP.  6. OSA - Continue CPAP nightly.   7. DM1 - Hgb A1c 6.3 7/22. - Managed by PCP.  8. Chronic Anemia - Baseline Hgb 8-9 range, likely 2/2 renal disease. - Hgb 8.3 (8/22)  9. Left-sided weakness - MRI negative for acute CVA. - Continue with PT/OT.  Follow up in 4-6 week with APP.  Brandon, FNP 03/15/21 3:18 PM

## 2021-03-15 ENCOUNTER — Other Ambulatory Visit (HOSPITAL_COMMUNITY): Payer: Self-pay

## 2021-03-15 ENCOUNTER — Encounter (HOSPITAL_COMMUNITY): Payer: Self-pay

## 2021-03-15 ENCOUNTER — Ambulatory Visit (HOSPITAL_COMMUNITY)
Admission: RE | Admit: 2021-03-15 | Discharge: 2021-03-15 | Disposition: A | Discharge status: Home / Self Care | Payer: HMO | Source: Ambulatory Visit | Attending: Internal Medicine | Admitting: Internal Medicine

## 2021-03-15 ENCOUNTER — Other Ambulatory Visit: Payer: Self-pay

## 2021-03-15 ENCOUNTER — Other Ambulatory Visit (HOSPITAL_COMMUNITY): Payer: Self-pay | Admitting: Internal Medicine

## 2021-03-15 ENCOUNTER — Ambulatory Visit (HOSPITAL_COMMUNITY)
Admission: RE | Admit: 2021-03-15 | Discharge: 2021-03-15 | Disposition: A | Discharge status: Home / Self Care | Payer: HMO | Source: Ambulatory Visit | Attending: Family Medicine | Admitting: Family Medicine

## 2021-03-15 VITALS — BP 174/62 | HR 74 | Wt 233.8 lb

## 2021-03-15 DIAGNOSIS — I130 Hypertensive heart and chronic kidney disease with heart failure and stage 1 through stage 4 chronic kidney disease, or unspecified chronic kidney disease: Secondary | ICD-10-CM | POA: Diagnosis not present

## 2021-03-15 DIAGNOSIS — Z955 Presence of coronary angioplasty implant and graft: Secondary | ICD-10-CM | POA: Insufficient documentation

## 2021-03-15 DIAGNOSIS — G931 Anoxic brain damage, not elsewhere classified: Secondary | ICD-10-CM

## 2021-03-15 DIAGNOSIS — R002 Palpitations: Secondary | ICD-10-CM | POA: Diagnosis not present

## 2021-03-15 DIAGNOSIS — Z833 Family history of diabetes mellitus: Secondary | ICD-10-CM | POA: Insufficient documentation

## 2021-03-15 DIAGNOSIS — Z7982 Long term (current) use of aspirin: Secondary | ICD-10-CM | POA: Insufficient documentation

## 2021-03-15 DIAGNOSIS — D509 Iron deficiency anemia, unspecified: Secondary | ICD-10-CM | POA: Diagnosis not present

## 2021-03-15 DIAGNOSIS — E1022 Type 1 diabetes mellitus with diabetic chronic kidney disease: Secondary | ICD-10-CM | POA: Diagnosis not present

## 2021-03-15 DIAGNOSIS — Z8249 Family history of ischemic heart disease and other diseases of the circulatory system: Secondary | ICD-10-CM | POA: Diagnosis not present

## 2021-03-15 DIAGNOSIS — Z888 Allergy status to other drugs, medicaments and biological substances status: Secondary | ICD-10-CM | POA: Insufficient documentation

## 2021-03-15 DIAGNOSIS — Z87891 Personal history of nicotine dependence: Secondary | ICD-10-CM | POA: Insufficient documentation

## 2021-03-15 DIAGNOSIS — G4733 Obstructive sleep apnea (adult) (pediatric): Secondary | ICD-10-CM | POA: Diagnosis not present

## 2021-03-15 DIAGNOSIS — Z79899 Other long term (current) drug therapy: Secondary | ICD-10-CM | POA: Diagnosis not present

## 2021-03-15 DIAGNOSIS — I480 Paroxysmal atrial fibrillation: Secondary | ICD-10-CM | POA: Insufficient documentation

## 2021-03-15 DIAGNOSIS — I5032 Chronic diastolic (congestive) heart failure: Secondary | ICD-10-CM | POA: Diagnosis not present

## 2021-03-15 DIAGNOSIS — Z8674 Personal history of sudden cardiac arrest: Secondary | ICD-10-CM | POA: Insufficient documentation

## 2021-03-15 DIAGNOSIS — Z7902 Long term (current) use of antithrombotics/antiplatelets: Secondary | ICD-10-CM | POA: Insufficient documentation

## 2021-03-15 DIAGNOSIS — E782 Mixed hyperlipidemia: Secondary | ICD-10-CM | POA: Diagnosis not present

## 2021-03-15 DIAGNOSIS — R531 Weakness: Secondary | ICD-10-CM | POA: Diagnosis not present

## 2021-03-15 DIAGNOSIS — I252 Old myocardial infarction: Secondary | ICD-10-CM | POA: Insufficient documentation

## 2021-03-15 DIAGNOSIS — Z794 Long term (current) use of insulin: Secondary | ICD-10-CM | POA: Diagnosis not present

## 2021-03-15 DIAGNOSIS — E1043 Type 1 diabetes mellitus with diabetic autonomic (poly)neuropathy: Secondary | ICD-10-CM | POA: Diagnosis not present

## 2021-03-15 DIAGNOSIS — D6489 Other specified anemias: Secondary | ICD-10-CM | POA: Diagnosis not present

## 2021-03-15 DIAGNOSIS — I2510 Atherosclerotic heart disease of native coronary artery without angina pectoris: Secondary | ICD-10-CM | POA: Diagnosis not present

## 2021-03-15 DIAGNOSIS — N184 Chronic kidney disease, stage 4 (severe): Secondary | ICD-10-CM | POA: Diagnosis not present

## 2021-03-15 LAB — BASIC METABOLIC PANEL
Anion gap: 10 (ref 5–15)
BUN: 42 mg/dL — ABNORMAL HIGH (ref 6–20)
CO2: 25 mmol/L (ref 22–32)
Calcium: 9.2 mg/dL (ref 8.9–10.3)
Chloride: 103 mmol/L (ref 98–111)
Creatinine, Ser: 1.72 mg/dL — ABNORMAL HIGH (ref 0.44–1.00)
GFR, Estimated: 34 mL/min — ABNORMAL LOW (ref >60)
Glucose, Bld: 174 mg/dL — ABNORMAL HIGH (ref 70–99)
Potassium: 4.4 mmol/L (ref 3.5–5.1)
Sodium: 138 mmol/L (ref 135–145)

## 2021-03-15 MED ORDER — EMPAGLIFLOZIN 10 MG PO TABS: 10.0000 mg | ORAL_TABLET | Freq: Every day | ORAL | 11 refills | Status: DC

## 2021-03-15 MED ORDER — CARVEDILOL 12.5 MG PO TABS: 12.5000 mg | ORAL_TABLET | Freq: Two times a day (BID) | ORAL | 11 refills | Status: DC

## 2021-03-15 NOTE — Patient Instructions (Signed)
INCREASE Coreg to 12. 5mg , one tab twice a day START Jardiance 10 mg one tab daily  Labs today We will only contact you if something comes back abnormal or we need to make some changes. Otherwise no news is good news!  Labs needed in 7-10 days  Your physician recommends that you schedule a follow-up appointment in: 4-6 weeks  in the Advanced Practitioners (PA/NP) Clinic    Do the following things EVERYDAY: Weigh yourself in the morning before breakfast. Write it down and keep it in a log. Take your medicines as prescribed Eat low salt foods--Limit salt (sodium) to 2000 mg per day.  Stay as active as you can everyday Limit all fluids for the day to less than 2 liters  At the [facility], you and your health needs are our priority. As part of our continuing mission to provide you with exceptional heart care, we have created designated Provider Care Teams. These Care Teams include your primary Cardiologist (physician) and Advanced Practice Providers (APPs- Physician Assistants and Nurse Practitioners) who all work together to provide you with the care you need, when you need it.   You may see any of the following providers on your designated Care Team at your next follow up: Dr [name] Dr Loralie Champagne Dr Patrice Paradise, NP Lyda Jester, [place] Ginnie Smart Audry Riles, PharmD   Please be sure to bring in all your medications bottles to every appointment.

## 2021-03-16 ENCOUNTER — Telehealth (HOSPITAL_COMMUNITY): Payer: Self-pay | Admitting: Family Medicine

## 2021-03-16 ENCOUNTER — Encounter (HOSPITAL_COMMUNITY): Payer: Self-pay

## 2021-03-16 DIAGNOSIS — J449 Chronic obstructive pulmonary disease, unspecified: Secondary | ICD-10-CM | POA: Diagnosis not present

## 2021-03-16 DIAGNOSIS — I2510 Atherosclerotic heart disease of native coronary artery without angina pectoris: Secondary | ICD-10-CM | POA: Diagnosis not present

## 2021-03-16 DIAGNOSIS — Z79891 Long term (current) use of opiate analgesic: Secondary | ICD-10-CM | POA: Diagnosis not present

## 2021-03-16 DIAGNOSIS — D509 Iron deficiency anemia, unspecified: Secondary | ICD-10-CM | POA: Diagnosis not present

## 2021-03-16 DIAGNOSIS — Z79899 Other long term (current) drug therapy: Secondary | ICD-10-CM | POA: Diagnosis not present

## 2021-03-16 DIAGNOSIS — I5030 Unspecified diastolic (congestive) heart failure: Secondary | ICD-10-CM | POA: Diagnosis not present

## 2021-03-16 DIAGNOSIS — E1142 Type 2 diabetes mellitus with diabetic polyneuropathy: Secondary | ICD-10-CM | POA: Diagnosis not present

## 2021-03-16 DIAGNOSIS — E1122 Type 2 diabetes mellitus with diabetic chronic kidney disease: Secondary | ICD-10-CM | POA: Diagnosis not present

## 2021-03-16 DIAGNOSIS — K219 Gastro-esophageal reflux disease without esophagitis: Secondary | ICD-10-CM | POA: Diagnosis not present

## 2021-03-16 DIAGNOSIS — Z794 Long term (current) use of insulin: Secondary | ICD-10-CM | POA: Diagnosis not present

## 2021-03-16 DIAGNOSIS — N2581 Secondary hyperparathyroidism of renal origin: Secondary | ICD-10-CM | POA: Diagnosis not present

## 2021-03-16 DIAGNOSIS — Z8674 Personal history of sudden cardiac arrest: Secondary | ICD-10-CM | POA: Diagnosis not present

## 2021-03-16 DIAGNOSIS — I130 Hypertensive heart and chronic kidney disease with heart failure and stage 1 through stage 4 chronic kidney disease, or unspecified chronic kidney disease: Secondary | ICD-10-CM | POA: Diagnosis not present

## 2021-03-16 DIAGNOSIS — G931 Anoxic brain damage, not elsewhere classified: Secondary | ICD-10-CM | POA: Diagnosis not present

## 2021-03-16 DIAGNOSIS — G4733 Obstructive sleep apnea (adult) (pediatric): Secondary | ICD-10-CM | POA: Diagnosis not present

## 2021-03-16 DIAGNOSIS — N1832 Chronic kidney disease, stage 3b: Secondary | ICD-10-CM | POA: Diagnosis not present

## 2021-03-16 DIAGNOSIS — D631 Anemia in chronic kidney disease: Secondary | ICD-10-CM | POA: Diagnosis not present

## 2021-03-16 DIAGNOSIS — E782 Mixed hyperlipidemia: Secondary | ICD-10-CM | POA: Diagnosis not present

## 2021-03-16 DIAGNOSIS — Z7902 Long term (current) use of antithrombotics/antiplatelets: Secondary | ICD-10-CM | POA: Diagnosis not present

## 2021-03-16 NOTE — Telephone Encounter (Signed)
Spoke to patient and confirmed that she is a Type 1 diabetic (type 1 and type 2 listed as diagnoses in chart). Instructed not to start Jardiance and to increase torsemide to 40 mg daily x 3 days then back to 20 mg daily. She has repeat labs scheduled. She verbalized understanding of plan and medication changes.  Allena Katz, FNP-BC

## 2021-03-21 DIAGNOSIS — N1832 Chronic kidney disease, stage 3b: Secondary | ICD-10-CM | POA: Diagnosis not present

## 2021-03-21 DIAGNOSIS — D631 Anemia in chronic kidney disease: Secondary | ICD-10-CM | POA: Diagnosis not present

## 2021-03-22 DIAGNOSIS — Z794 Long term (current) use of insulin: Secondary | ICD-10-CM | POA: Diagnosis not present

## 2021-03-22 DIAGNOSIS — E1142 Type 2 diabetes mellitus with diabetic polyneuropathy: Secondary | ICD-10-CM | POA: Diagnosis not present

## 2021-03-22 DIAGNOSIS — Z7902 Long term (current) use of antithrombotics/antiplatelets: Secondary | ICD-10-CM | POA: Diagnosis not present

## 2021-03-22 DIAGNOSIS — Z8674 Personal history of sudden cardiac arrest: Secondary | ICD-10-CM | POA: Diagnosis not present

## 2021-03-22 DIAGNOSIS — D509 Iron deficiency anemia, unspecified: Secondary | ICD-10-CM | POA: Diagnosis not present

## 2021-03-22 DIAGNOSIS — N2581 Secondary hyperparathyroidism of renal origin: Secondary | ICD-10-CM | POA: Diagnosis not present

## 2021-03-22 DIAGNOSIS — I5030 Unspecified diastolic (congestive) heart failure: Secondary | ICD-10-CM | POA: Diagnosis not present

## 2021-03-22 DIAGNOSIS — J449 Chronic obstructive pulmonary disease, unspecified: Secondary | ICD-10-CM | POA: Diagnosis not present

## 2021-03-22 DIAGNOSIS — G931 Anoxic brain damage, not elsewhere classified: Secondary | ICD-10-CM | POA: Diagnosis not present

## 2021-03-22 DIAGNOSIS — E1122 Type 2 diabetes mellitus with diabetic chronic kidney disease: Secondary | ICD-10-CM | POA: Diagnosis not present

## 2021-03-22 DIAGNOSIS — Z79899 Other long term (current) drug therapy: Secondary | ICD-10-CM | POA: Diagnosis not present

## 2021-03-22 DIAGNOSIS — N1832 Chronic kidney disease, stage 3b: Secondary | ICD-10-CM | POA: Diagnosis not present

## 2021-03-22 DIAGNOSIS — I130 Hypertensive heart and chronic kidney disease with heart failure and stage 1 through stage 4 chronic kidney disease, or unspecified chronic kidney disease: Secondary | ICD-10-CM | POA: Diagnosis not present

## 2021-03-22 DIAGNOSIS — E782 Mixed hyperlipidemia: Secondary | ICD-10-CM | POA: Diagnosis not present

## 2021-03-22 DIAGNOSIS — Z79891 Long term (current) use of opiate analgesic: Secondary | ICD-10-CM | POA: Diagnosis not present

## 2021-03-22 DIAGNOSIS — K219 Gastro-esophageal reflux disease without esophagitis: Secondary | ICD-10-CM | POA: Diagnosis not present

## 2021-03-22 DIAGNOSIS — D631 Anemia in chronic kidney disease: Secondary | ICD-10-CM | POA: Diagnosis not present

## 2021-03-22 DIAGNOSIS — G4733 Obstructive sleep apnea (adult) (pediatric): Secondary | ICD-10-CM | POA: Diagnosis not present

## 2021-03-22 DIAGNOSIS — I2510 Atherosclerotic heart disease of native coronary artery without angina pectoris: Secondary | ICD-10-CM | POA: Diagnosis not present

## 2021-03-23 DIAGNOSIS — Z9989 Dependence on other enabling machines and devices: Secondary | ICD-10-CM | POA: Diagnosis not present

## 2021-03-23 DIAGNOSIS — G4733 Obstructive sleep apnea (adult) (pediatric): Secondary | ICD-10-CM | POA: Diagnosis not present

## 2021-03-23 DIAGNOSIS — J449 Chronic obstructive pulmonary disease, unspecified: Secondary | ICD-10-CM | POA: Diagnosis not present

## 2021-03-24 DIAGNOSIS — Z8674 Personal history of sudden cardiac arrest: Secondary | ICD-10-CM | POA: Diagnosis not present

## 2021-03-24 DIAGNOSIS — Z7902 Long term (current) use of antithrombotics/antiplatelets: Secondary | ICD-10-CM | POA: Diagnosis not present

## 2021-03-24 DIAGNOSIS — G931 Anoxic brain damage, not elsewhere classified: Secondary | ICD-10-CM | POA: Diagnosis not present

## 2021-03-24 DIAGNOSIS — Z79891 Long term (current) use of opiate analgesic: Secondary | ICD-10-CM | POA: Diagnosis not present

## 2021-03-24 DIAGNOSIS — I130 Hypertensive heart and chronic kidney disease with heart failure and stage 1 through stage 4 chronic kidney disease, or unspecified chronic kidney disease: Secondary | ICD-10-CM | POA: Diagnosis not present

## 2021-03-24 DIAGNOSIS — Z794 Long term (current) use of insulin: Secondary | ICD-10-CM | POA: Diagnosis not present

## 2021-03-24 DIAGNOSIS — Z79899 Other long term (current) drug therapy: Secondary | ICD-10-CM | POA: Diagnosis not present

## 2021-03-24 DIAGNOSIS — K219 Gastro-esophageal reflux disease without esophagitis: Secondary | ICD-10-CM | POA: Diagnosis not present

## 2021-03-24 DIAGNOSIS — N2581 Secondary hyperparathyroidism of renal origin: Secondary | ICD-10-CM | POA: Diagnosis not present

## 2021-03-24 DIAGNOSIS — J449 Chronic obstructive pulmonary disease, unspecified: Secondary | ICD-10-CM | POA: Diagnosis not present

## 2021-03-24 DIAGNOSIS — E782 Mixed hyperlipidemia: Secondary | ICD-10-CM | POA: Diagnosis not present

## 2021-03-24 DIAGNOSIS — I2510 Atherosclerotic heart disease of native coronary artery without angina pectoris: Secondary | ICD-10-CM | POA: Diagnosis not present

## 2021-03-24 DIAGNOSIS — E1142 Type 2 diabetes mellitus with diabetic polyneuropathy: Secondary | ICD-10-CM | POA: Diagnosis not present

## 2021-03-24 DIAGNOSIS — G4733 Obstructive sleep apnea (adult) (pediatric): Secondary | ICD-10-CM | POA: Diagnosis not present

## 2021-03-24 DIAGNOSIS — E1122 Type 2 diabetes mellitus with diabetic chronic kidney disease: Secondary | ICD-10-CM | POA: Diagnosis not present

## 2021-03-24 DIAGNOSIS — I5030 Unspecified diastolic (congestive) heart failure: Secondary | ICD-10-CM | POA: Diagnosis not present

## 2021-03-24 DIAGNOSIS — D509 Iron deficiency anemia, unspecified: Secondary | ICD-10-CM | POA: Diagnosis not present

## 2021-03-24 DIAGNOSIS — D631 Anemia in chronic kidney disease: Secondary | ICD-10-CM | POA: Diagnosis not present

## 2021-03-24 DIAGNOSIS — N1832 Chronic kidney disease, stage 3b: Secondary | ICD-10-CM | POA: Diagnosis not present

## 2021-03-27 ENCOUNTER — Other Ambulatory Visit: Payer: Self-pay

## 2021-03-27 ENCOUNTER — Telehealth (HOSPITAL_COMMUNITY): Payer: Self-pay | Admitting: Pharmacy Technician

## 2021-03-27 MED ORDER — HYDROCODONE-ACETAMINOPHEN 7.5-325 MG PO TABS: 1.0000 | ORAL_TABLET | Freq: Three times a day (TID) | ORAL | 0 refills | Status: DC | PRN

## 2021-03-27 MED ORDER — LORAZEPAM 0.5 MG PO TABS: 0.5000 mg | ORAL_TABLET | Freq: Every day | ORAL | 2 refills | Status: DC | PRN

## 2021-03-27 NOTE — Telephone Encounter (Signed)
Advanced Heart Failure Patient Advocate Encounter  Received a message from a nurse looking to help the patient get assistance for Jardiance. Reported that patient did not pick up RX from the pharmacy due to cost.   Called and spoke with the patient. Reminded patient of Janett Billow (FNP) note from 09/01. Patient is not to start Jardiance. She has some Farxiga at home as well, wondered if she was to take that. I confirmed with Janett Billow that she is not to take [place] either, due to DM1.  Patient voiced understanding.   Charlann Boxer, CPhT

## 2021-03-28 ENCOUNTER — Other Ambulatory Visit (HOSPITAL_COMMUNITY): Payer: Self-pay

## 2021-03-28 ENCOUNTER — Telehealth: Payer: Self-pay | Admitting: Internal Medicine

## 2021-03-28 MED ORDER — EZETIMIBE 10 MG PO TABS: 10.0000 mg | ORAL_TABLET | Freq: Every day | ORAL | 2 refills | Status: DC

## 2021-03-28 MED ORDER — ISOSORBIDE MONONITRATE ER 30 MG PO TB24: 30.0000 mg | ORAL_TABLET | Freq: Every day | ORAL | 0 refills | Status: DC

## 2021-03-28 NOTE — Telephone Encounter (Signed)
*  STAT* If patient is at the pharmacy, call can be transferred to refill team.   1. Which medications need to be refilled? (please list name of each medication and dose if known)  ezetimibe (ZETIA) 10 MG tablet  2. Which pharmacy/location (including street and city if local pharmacy) is medication to be sent to?  [place], [place]  3. Do they need a 30 day or 90 day supply? [place]

## 2021-03-28 NOTE — Telephone Encounter (Signed)
Refills has been sent to the pharmacy. 

## 2021-03-29 ENCOUNTER — Other Ambulatory Visit (HOSPITAL_COMMUNITY): Payer: Self-pay

## 2021-03-30 ENCOUNTER — Other Ambulatory Visit: Payer: HMO

## 2021-03-30 ENCOUNTER — Other Ambulatory Visit: Payer: Self-pay

## 2021-03-30 DIAGNOSIS — D631 Anemia in chronic kidney disease: Secondary | ICD-10-CM

## 2021-03-30 DIAGNOSIS — N1832 Chronic kidney disease, stage 3b: Secondary | ICD-10-CM | POA: Diagnosis not present

## 2021-03-30 MED ORDER — HYDRALAZINE HCL 25 MG PO TABS: 25.0000 mg | ORAL_TABLET | Freq: Three times a day (TID) | ORAL | 0 refills | Status: DC

## 2021-03-31 DIAGNOSIS — N2581 Secondary hyperparathyroidism of renal origin: Secondary | ICD-10-CM | POA: Diagnosis not present

## 2021-03-31 DIAGNOSIS — Z8674 Personal history of sudden cardiac arrest: Secondary | ICD-10-CM | POA: Diagnosis not present

## 2021-03-31 DIAGNOSIS — Z7902 Long term (current) use of antithrombotics/antiplatelets: Secondary | ICD-10-CM | POA: Diagnosis not present

## 2021-03-31 DIAGNOSIS — J449 Chronic obstructive pulmonary disease, unspecified: Secondary | ICD-10-CM | POA: Diagnosis not present

## 2021-03-31 DIAGNOSIS — I130 Hypertensive heart and chronic kidney disease with heart failure and stage 1 through stage 4 chronic kidney disease, or unspecified chronic kidney disease: Secondary | ICD-10-CM | POA: Diagnosis not present

## 2021-03-31 DIAGNOSIS — I5030 Unspecified diastolic (congestive) heart failure: Secondary | ICD-10-CM | POA: Diagnosis not present

## 2021-03-31 DIAGNOSIS — K219 Gastro-esophageal reflux disease without esophagitis: Secondary | ICD-10-CM | POA: Diagnosis not present

## 2021-03-31 DIAGNOSIS — E1122 Type 2 diabetes mellitus with diabetic chronic kidney disease: Secondary | ICD-10-CM | POA: Diagnosis not present

## 2021-03-31 DIAGNOSIS — Z79891 Long term (current) use of opiate analgesic: Secondary | ICD-10-CM | POA: Diagnosis not present

## 2021-03-31 DIAGNOSIS — N1832 Chronic kidney disease, stage 3b: Secondary | ICD-10-CM | POA: Diagnosis not present

## 2021-03-31 DIAGNOSIS — E1142 Type 2 diabetes mellitus with diabetic polyneuropathy: Secondary | ICD-10-CM | POA: Diagnosis not present

## 2021-03-31 DIAGNOSIS — Z79899 Other long term (current) drug therapy: Secondary | ICD-10-CM | POA: Diagnosis not present

## 2021-03-31 DIAGNOSIS — G931 Anoxic brain damage, not elsewhere classified: Secondary | ICD-10-CM | POA: Diagnosis not present

## 2021-03-31 DIAGNOSIS — D509 Iron deficiency anemia, unspecified: Secondary | ICD-10-CM | POA: Diagnosis not present

## 2021-03-31 DIAGNOSIS — Z794 Long term (current) use of insulin: Secondary | ICD-10-CM | POA: Diagnosis not present

## 2021-03-31 DIAGNOSIS — G4733 Obstructive sleep apnea (adult) (pediatric): Secondary | ICD-10-CM | POA: Diagnosis not present

## 2021-03-31 DIAGNOSIS — E782 Mixed hyperlipidemia: Secondary | ICD-10-CM | POA: Diagnosis not present

## 2021-03-31 DIAGNOSIS — I2510 Atherosclerotic heart disease of native coronary artery without angina pectoris: Secondary | ICD-10-CM | POA: Diagnosis not present

## 2021-03-31 DIAGNOSIS — D631 Anemia in chronic kidney disease: Secondary | ICD-10-CM | POA: Diagnosis not present

## 2021-03-31 LAB — CBC
Hematocrit: 28.3 % — ABNORMAL LOW (ref 34.0–46.6)
Hemoglobin: 9 g/dL — ABNORMAL LOW (ref 11.1–15.9)
MCH: 27.4 pg (ref 26.6–33.0)
MCHC: 31.8 g/dL (ref 31.5–35.7)
MCV: 86 fL (ref 79–97)
Platelets: 315 10*3/uL (ref 150–450)
RBC: 3.28 x10E6/uL — ABNORMAL LOW (ref 3.77–5.28)
RDW: 13.6 % (ref 11.7–15.4)
WBC: 9.7 10*3/uL (ref 3.4–10.8)

## 2021-03-31 LAB — RENAL FUNCTION PANEL
Albumin: 3.9 g/dL (ref 3.8–4.9)
BUN/Creatinine Ratio: 30 — ABNORMAL HIGH (ref 9–23)
BUN: 55 mg/dL — ABNORMAL HIGH (ref 6–24)
CO2: 24 mmol/L (ref 20–29)
Calcium: 9.2 mg/dL (ref 8.7–10.2)
Chloride: 101 mmol/L (ref 96–106)
Creatinine, Ser: 1.82 mg/dL — ABNORMAL HIGH (ref 0.57–1.00)
Glucose: 117 mg/dL — ABNORMAL HIGH (ref 65–99)
Phosphorus: 5.6 mg/dL — ABNORMAL HIGH (ref 3.0–4.3)
Potassium: 4.6 mmol/L (ref 3.5–5.2)
Sodium: 140 mmol/L (ref 134–144)
eGFR: 32 mL/min/{1.73_m2} — ABNORMAL LOW (ref >59)

## 2021-04-03 ENCOUNTER — Telehealth (HOSPITAL_COMMUNITY): Payer: Self-pay | Admitting: Adult Health

## 2021-04-03 NOTE — Telephone Encounter (Signed)
   Cardiomems Goal 10  Todays reading 15.    I personally called her the above.  Increase torsemide to 40 mg daily x2 days then back to 20 mg daily.   Megumi Treaster NP-C  2:35 PM

## 2021-04-04 ENCOUNTER — Ambulatory Visit (INDEPENDENT_AMBULATORY_CARE_PROVIDER_SITE_OTHER): Payer: HMO | Admitting: Family Medicine

## 2021-04-04 ENCOUNTER — Encounter: Payer: Self-pay | Admitting: Family Medicine

## 2021-04-04 ENCOUNTER — Other Ambulatory Visit: Payer: Self-pay

## 2021-04-04 VITALS — BP 136/60 | HR 64 | Temp 97.4°F | Resp 16 | Ht 65.0 in | Wt 235.0 lb

## 2021-04-04 DIAGNOSIS — E1142 Type 2 diabetes mellitus with diabetic polyneuropathy: Secondary | ICD-10-CM

## 2021-04-04 DIAGNOSIS — N2581 Secondary hyperparathyroidism of renal origin: Secondary | ICD-10-CM | POA: Diagnosis not present

## 2021-04-04 DIAGNOSIS — I2510 Atherosclerotic heart disease of native coronary artery without angina pectoris: Secondary | ICD-10-CM

## 2021-04-04 DIAGNOSIS — Z23 Encounter for immunization: Secondary | ICD-10-CM | POA: Diagnosis not present

## 2021-04-04 DIAGNOSIS — I130 Hypertensive heart and chronic kidney disease with heart failure and stage 1 through stage 4 chronic kidney disease, or unspecified chronic kidney disease: Secondary | ICD-10-CM

## 2021-04-04 DIAGNOSIS — I469 Cardiac arrest, cause unspecified: Secondary | ICD-10-CM

## 2021-04-04 DIAGNOSIS — N184 Chronic kidney disease, stage 4 (severe): Secondary | ICD-10-CM

## 2021-04-04 DIAGNOSIS — D631 Anemia in chronic kidney disease: Secondary | ICD-10-CM | POA: Diagnosis not present

## 2021-04-04 DIAGNOSIS — G720 Drug-induced myopathy: Secondary | ICD-10-CM | POA: Diagnosis not present

## 2021-04-04 DIAGNOSIS — T466X5A Adverse effect of antihyperlipidemic and antiarteriosclerotic drugs, initial encounter: Secondary | ICD-10-CM | POA: Diagnosis not present

## 2021-04-04 DIAGNOSIS — N1832 Chronic kidney disease, stage 3b: Secondary | ICD-10-CM | POA: Insufficient documentation

## 2021-04-04 DIAGNOSIS — J9611 Chronic respiratory failure with hypoxia: Secondary | ICD-10-CM

## 2021-04-04 NOTE — Progress Notes (Signed)
Subjective:  Patient ID: Dana [name], female    DOB: 10-02-1960  Age: 60 y.o. MRN: [id number]  Chief Complaint  Patient presents with   Diabetes   Obesity    HPI  February and March 2022 - 2 heart attacks. These were both treated with stents.    July 2022:ED: for constipation, nausea, and vomitig. BP found to be extremely high at sbp 230. Pt did not respond to hydralazine IV, so given labetolol drip which helped. She was transferred to ICU. She was on the way to ct scan when she coded. She was in PEA. Given EPI x 3. Required levophed for hypotension. Intubated for 36 hours and then was able to be extubated. By the time she was extubated she had been moved to [facility] medical center due to AKI on CKD. Sugars > 500. Pt was discharged to SNF for 3 weeks for rehab. Now back home and doing great.  Pt has followed up with cardiology and nephrology. Has an appt with endocrinology at the end of the month.   Diabetes:  Sugars 90-s130s.   Current Outpatient Medications on File Prior to Visit  Medication Sig Dispense Refill   allopurinol (ZYLOPRIM) 100 MG tablet Take 1 tablet (100 mg total) by mouth daily. 90 tablet 0   aspirin 81 MG chewable tablet Chew 81 mg by mouth daily.     BD PEN NEEDLE NANO 2ND GEN 32G X 4 MM MISC 4 (four) times daily.     carvedilol (COREG) 12.5 MG tablet Take 1 tablet (12.5 mg total) by mouth 2 (two) times daily. 60 tablet 11   Cholecalciferol (VITAMIN D) 50 MCG (2000 UT) tablet Take 2,000 Units by mouth daily. Take one daily     clopidogrel (PLAVIX) 75 MG tablet Take 1 tablet (75 mg total) by mouth at bedtime. 90 tablet 3   Cyanocobalamin 3000 MCG CAPS Take 2 capsules by mouth daily. Patient purchased 3000 mcg gummies and taking 2 gummies daily     EPINEPHrine 0.3 mg/0.3 mL IJ SOAJ injection Use as directed for life-threatening allergic reaction. 4 Device 3   Evolocumab (REPATHA SURECLICK) 222 MG/ML SOAJ Inject 1 Dose into the skin every 14 (fourteen) days. 6 mL 3    ezetimibe (ZETIA) 10 MG tablet Take 1 tablet (10 mg total) by mouth daily. 90 tablet 2   famotidine (PEPCID) 20 MG tablet Take 1 tablet (20 mg total) by mouth daily. 30 tablet 5   ferrous sulfate 325 (65 FE) MG tablet Take 325 mg by mouth 2 (two) times daily with a meal.     hydrALAZINE (APRESOLINE) 25 MG tablet Take 1 tablet (25 mg total) by mouth 3 (three) times daily. 90 tablet 0   HYDROcodone-acetaminophen (NORCO) 7.5-325 MG tablet Take 1 tablet by mouth 3 (three) times daily as needed for moderate pain. 90 tablet 0   insulin aspart (NOVOLOG) 100 UNIT/ML injection Inject 2-10 Units into the skin 3 (three) times daily before meals.     isosorbide mononitrate (IMDUR) 30 MG 24 hr tablet Take 1 tablet (30 mg total) by mouth daily. 30 tablet 0   lactulose (CHRONULAC) 10 GM/15ML solution Take 15 mLs (10 g total) by mouth daily as needed for mild constipation. 236 mL 0   latanoprost (XALATAN) 0.005 % ophthalmic solution Place 1 drop into both eyes at bedtime.     LEVEMIR FLEXTOUCH 100 UNIT/ML FlexPen Inject 0-50 Units into the skin daily.     LORazepam (ATIVAN) 0.5 MG tablet Take  1 tablet (0.5 mg total) by mouth daily as needed for anxiety. 30 tablet 2   magnesium oxide (MAG-OX) 400 MG tablet Take 2 tablets (800 mg total) by mouth daily. Take 2 (400 mg) daily=800 mg 60 tablet 1   nitroGLYCERIN (NITROSTAT) 0.4 MG SL tablet Place 1 tablet (0.4 mg total) under the tongue every 5 (five) minutes as needed for chest pain. 15 tablet 2   Omega-3 Fatty Acids (FISH OIL PO) Take 1 capsule by mouth daily.     OXYGEN Inhale 2 L into the lungs at bedtime.     pantoprazole (PROTONIX) 40 MG tablet Take 1 tablet (40 mg total) by mouth daily. 30 tablet 3   Probiotic CAPS Take 1 capsule by mouth daily.     promethazine (PHENERGAN) 25 MG tablet Take 1 tablet (25 mg total) by mouth every 6 (six) hours as needed for nausea or vomiting. 90 tablet 1   torsemide (DEMADEX) 20 MG tablet Take 1 tablet (20 mg total) by mouth  daily. 30 tablet 3   No current facility-administered medications on file prior to visit.   Past Medical History:  Diagnosis Date   Acquired absence of left great toe [facility])    Acquired absence of other left toe(s) (HCC)    Anginal pain ([place])    Atherosclerotic heart disease of native coronary artery without angina pectoris    CAD (coronary artery disease)    08/2016 DES to RCA, also with history of LAD stenting.    Chronic diastolic (congestive) heart failure (HCC)    Chronic kidney disease, stage 2 (mild)    Chronic systolic (congestive) heart failure (HCC)    Contrast dye induced nephropathy, possible 09/03/2020   Diabetes (HCC)    Diastolic CHF (HCC)    Gastro-esophageal reflux disease without esophagitis    Hyperlipidemia    Hypertension    Hypertensive heart disease with heart failure (HCC)    Hypoxemia    Idiopathic gout, multiple sites    Localized edema    Low back pain    Mixed hyperlipidemia    Mixed incontinence    Morbid (severe) obesity due to excess calories (HCC)    Non-ST elevation (NSTEMI) myocardial infarction ([place]) 09/03/2020   Non-ST elevation (NSTEMI) myocardial infarction ([place]) 09/03/2020   OSA (obstructive sleep apnea)    OSA (obstructive sleep apnea) 09/03/2020   Other specified hypothyroidism    Primary insomnia    Type 2 diabetes mellitus with complication, without long-term current use of insulin ([place]) 09/03/2020   Type 2 diabetes mellitus with diabetic nephropathy ([place])    Past Surgical History:  Procedure Laterality Date   ABDOMINAL HYSTERECTOMY     Partial- Still has both ovaries   CARDIOVASCULAR STRESS TEST  08/13/2014   Nuclear; Normal   CARPAL TUNNEL RELEASE     CATARACT EXTRACTION, BILATERAL     CESAREAN SECTION     CHOLECYSTECTOMY     CORONARY ANGIOPLASTY WITH STENT PLACEMENT     3 blockages/ 1 stent   CORONARY STENT INTERVENTION N/A 09/29/2020   Procedure: CORONARY STENT INTERVENTION;  Surgeon: Sherren Mocha, MD;  Location: [place]  CV LAB;  Service: Cardiovascular;  Laterality: N/A;  CFX   EYE SURGERY     LEFT HEART CATH AND CORONARY ANGIOGRAPHY N/A 09/29/2020   Procedure: LEFT HEART CATH AND CORONARY ANGIOGRAPHY;  Surgeon: Sherren Mocha, MD;  Location: [place] [facility];  Service: Cardiovascular;  Laterality: N/A;   RIGHT/LEFT HEART CATH AND CORONARY ANGIOGRAPHY N/A 01/07/2017  Procedure: Right/Left Heart Cath and Coronary Angiography;  Surgeon: Larey Dresser, MD;  Location: [place] [facility];  Service: Cardiovascular;  Laterality: N/A;   ROTATOR CUFF REPAIR     TOE AMPUTATION Left    US ECHOCARDIOGRAPHY  05/2017   Normal    Family History  Problem Relation Age of Onset   Hypertension Father    Coronary artery disease Mother    Hypertension Mother    Hyperlipidemia Mother    Diabetes type II Mother    Breast cancer Mother    Lung cancer Mother    Bipolar disorder Other    Depression Other    Schizophrenia Other    Social History   Socioeconomic History   Marital status: Married    Spouse name: Not on file   Number of children: Not on file   Years of education: Not on file   Highest education level: Not on file  Occupational History   Not on file  Tobacco Use   Smoking status: Former   Smokeless tobacco: Never  Vaping Use   Vaping Use: Never used  Substance and Sexual Activity   Alcohol use: Not Currently   Drug use: Not Currently   Sexual activity: Not on file  Other Topics Concern   Not on file  Social History Narrative   Not on file   Social Determinants of Health   Financial Resource Strain: Not on file  Food Insecurity: No Food Insecurity   Worried About Running Out of Food in the Last Year: Never true   [place] in the Last Year: Never true  Transportation Needs: No Transportation Needs   Lack of Transportation (Medical): No   Lack of Transportation (Non-Medical): No  Physical Activity: Not on file  Stress: Not on file  Social Connections: Not on file    Review of  Systems  Constitutional:  Negative for chills, fatigue and fever.  HENT:  Negative for congestion, ear pain, rhinorrhea and sore throat.   Respiratory:  Negative for cough and shortness of breath.   Cardiovascular:  Negative for chest pain and palpitations.  Gastrointestinal:  Positive for nausea. Negative for abdominal pain, constipation, diarrhea and vomiting.  Endocrine: Positive for polyuria. Negative for polydipsia and polyphagia.  Genitourinary:  Negative for dysuria and urgency.  Musculoskeletal:  Positive for back pain. Negative for myalgias.  Neurological:  Negative for dizziness, weakness, light-headedness and headaches.  Psychiatric/Behavioral:  Positive for dysphoric mood. The patient is not nervous/anxious.     Objective:  BP 136/60   Pulse 64   Temp (!) 97.4 F (36.3 C)   Resp 16   Ht 5\' 5"  (1.651 m)   Wt 235 lb (106.6 kg)   BMI 39.11 kg/m   BP/Weight 04/12/2021 04/04/2021 01/05/6332  Systolic BP 545 625 638  Diastolic BP 62 60 62  Wt. (Lbs) 240.4 235 233.8  BMI 40 39.11 38.91    Physical Exam Vitals reviewed.  Constitutional:      Appearance: Normal appearance. She is obese.  Neck:     Vascular: No carotid bruit.  Cardiovascular:     Rate and Rhythm: Normal rate and regular rhythm.     Pulses: Normal pulses.     Heart sounds: Normal heart sounds.  Pulmonary:     Effort: Pulmonary effort is normal. No respiratory distress.     Breath sounds: Normal breath sounds.  Abdominal:     General: Abdomen is flat. Bowel sounds are normal.  Palpations: Abdomen is soft.     Tenderness: There is no abdominal tenderness.  Musculoskeletal:     Comments: Legs wrapped with compression device.  Neurological:     Mental Status: She is alert and oriented to person, place, and time.  Psychiatric:        Mood and Affect: Mood normal.        Behavior: Behavior normal.    Diabetic Foot Exam - Simple   No data filed      Lab Results  Component Value Date   WBC 9.7  03/30/2021   HGB 9.0 (L) 03/30/2021   HCT 28.3 (L) 03/30/2021   PLT 315 03/30/2021   GLUCOSE 182 (H) 04/12/2021   CHOL 175 10/17/2020   TRIG 130 10/17/2020   HDL 43 10/17/2020   LDLCALC 109 (H) 10/17/2020   ALT 16 02/28/2021   AST 18 02/28/2021   NA 136 04/12/2021   K 4.8 04/12/2021   CL 103 04/12/2021   CREATININE 2.37 (H) 04/12/2021   BUN 64 (H) 04/12/2021   CO2 25 04/12/2021   TSH 3.590 03/23/2020   INR 1.08 01/01/2017   HGBA1C 6.8 (H) 09/30/2020      Assessment & Plan:   Problem List Items Addressed This Visit       Cardiovascular and Mediastinum   Coronary artery disease involving native coronary artery of native heart without angina pectoris    The current medical regimen is effective;  continue present plan and medications. Management per specialist.        PEA (Pulseless electrical activity) ([place])    Pt was resuscitated in the ambulance on the way to the hospital. Doing well.       Hypertensive heart and renal disease with congestive heart failure ([place])    Changed from Dr. Duwaine Maxin to Dr. Audie Clear for nephrology.         Respiratory   Chronic respiratory failure with hypoxia (HCC)    Continue 2 L oxygen at night.  Pt is changing from Dr. Alcide Clever to Dr. Wallie Char. Repeat sleep study to be ordered by him.         Endocrine   Diabetic polyneuropathy associated with type 2 diabetes mellitus ([place])    Management per specialist.  Control: fair Recommend check sugars fasting daily. Recommend check feet daily. Recommend annual eye exams. Medicines: no changes Continue to work on eating a healthy diet and exercise.         Secondary hyperparathyroidism of renal origin [facility])     Musculoskeletal and Integument   Statin myopathy    Intolerant to statins. Causes muscle pain.         Other   Anemia   Other Visit Diagnoses     Need for influenza vaccination    -  Primary   Relevant Orders   Flu Vaccine MDCK QUAD PF (Completed)     .  No orders  of the defined types were placed in this encounter.   Orders Placed This Encounter  Procedures   Flu Vaccine MDCK QUAD PF     Follow-up: Return in about 2 months (around 06/15/2021) for awv (KIM), chronic fasting in 3 months..  An After Visit Summary was printed and given to the patient.  [name], MD [facility] [phone]

## 2021-04-04 NOTE — Assessment & Plan Note (Addendum)
Changed from Dr. Duwaine Maxin to Dr. Audie Clear for nephrology.

## 2021-04-04 NOTE — Assessment & Plan Note (Addendum)
Continue 2 L oxygen at night.  Pt is changing from Dr. Alcide Clever to Dr. Wallie Char. Repeat sleep study to be ordered by him.

## 2021-04-05 ENCOUNTER — Telehealth: Payer: Self-pay | Admitting: Internal Medicine

## 2021-04-05 NOTE — Telephone Encounter (Signed)
[employer] approved until 11/21/2021

## 2021-04-10 DIAGNOSIS — I10 Essential (primary) hypertension: Secondary | ICD-10-CM | POA: Diagnosis not present

## 2021-04-10 DIAGNOSIS — K3184 Gastroparesis: Secondary | ICD-10-CM | POA: Diagnosis not present

## 2021-04-10 DIAGNOSIS — E785 Hyperlipidemia, unspecified: Secondary | ICD-10-CM | POA: Diagnosis not present

## 2021-04-10 DIAGNOSIS — E1043 Type 1 diabetes mellitus with diabetic autonomic (poly)neuropathy: Secondary | ICD-10-CM | POA: Diagnosis not present

## 2021-04-10 DIAGNOSIS — E1040 Type 1 diabetes mellitus with diabetic neuropathy, unspecified: Secondary | ICD-10-CM | POA: Diagnosis not present

## 2021-04-11 NOTE — Progress Notes (Signed)
Advanced Heart Failure Clinic Note   PCP: [name], MD PCP-Cardiologist: [name], MD   Reason for Visit: CHF follow up.  HPI: Ms Codd is 60 y.o. female with history of diastolic heart failure, CAD, DES RCA/LAD 2018, DMI, htn, hyperlipidemia, PAD, and OSA on CPAP, PAF s/p ablation, PAD s/p L great toe and 4th toe amputation.  Echo 5/21 EF 60-65% RV ok. Grade II DD.   Zio patch 02/2018 (for possible syncope) with no arrhythmias, only occasional PACs/PVCs.   She is s/p Cardiomems placement (8/19).   Admitted 08/29/20 with chest pain and increased shortness of breath. CTA negative for PE. HS Trop 41>66. Initially thought to be from demand ischemia. Had chest pain again with HS Trop up to  1917. Placed on heparin + Ntg drip and diuresed with IV lasix. Repeat echo (2/22) EF 55-60%. Cath was not pursued with creatinine 3.4. Hospital course complicated by AKI and anemia. SCr down to 2.71 on discharge, weight 253 lbs.   09/29/20 Patient awoke during the night with sharp chest pain and shortness of breath. She presented with ST depressions in V4 and V5, HS troponin that increased from 60>239, and CXR consistent with pulmonary edema.  Additionally she had a leukocytosis on admission which was felt to be reactive in the setting of NSTEMI.   She was started on nitro and heparin drips with improvement in symptoms.  She underwent LHC (3/22) showing 3 vessel CAD with moderate mid-LAD & mid-RCA stenosis and mid-L-Cx stenosis and moderately elevated LVEDP. S/p successful PCI of mid-circ with DES and side branch angioplasty of OM1.   Unfortunately she developed a contrast-induced nephropathy after her cath and her creatinine went from 2.2 up to a peak of 5.5.  She remained volume overloaded at this time.  Additionally, she developed severe nausea and vomiting, gastroparesis and required IV antiplatelet therapy.  While on this therapy she developed epistaxis and acute blood loss anemia requiring  transfusion.  She was given promotility agents given her history of gastroparesis her nausea and vomiting improved.  Epistaxis was treated with nasal Afrin.  She was switched back to oral DAPT. During this time diuretics were reintroduced and her creatinine began to improve over the following days. Her volume status improved significantly, she no longer had bleeding, remained chest pain free and had no further nausea and vomiting.  Her leukocytosis persisted but all her infectious workup was negative and this was felt to be reactive.  She was discharged on 10/06/20.   Post hospital doing well. SCr stable at 1.81. Hgb stable at 8.7. CardioMEMs: Goal dPAP: 10  dPAP: 5 (volume control has been good, persistently below goal). Losing weight and doing CR.  Per chart review, she was admitted 6/22 to [facility] for DKA, started on insulin gtt, IVF and diuretics held. Developed pulmonary vascular congestion vs PNA and was started on IV abx, and diuretics were resumed. She suffered a PEA arrest 01/12/21 with ROSC after 10-15 minutes and was transferred to higher level of care at [facility]; precipitating factor for PEA arrest thought to be respiratory driven. On arrival, she was still in DKA, AKI on CKD with oliguria and Nephrology was consulted for further management. TTE with EF 60%, min MR/TR. Concern for stroke due to left-sided weakness and MRI confirmed anoxic brain injury, but no definite infarct. She was extubated, SCr normalized to baseline of 1.5, and she was discharged to rehab facility.   Today she returns for HF follow up. She was instructed to increase  torsemide to 40 mg x 2 days for PAD of 15 recently, she did this for 3 days. She has been feeling more SOB for past 36 hours.  Denies CP, dizziness, edema, or PND/Orthopnea. Appetite ok. No fever or chills. Weight at home 239 pounds. Taking all medications. Limiting fluids and salty foods.   Cardiac Studies: - Echo (5/21): EF 60-65%, RV ok, Grade II DD. - Echo  (2/22): EF 55-60%, RV ok, Grade II DD, mod MR - Echo (3/22): EF 55-60%, Grade II DD, normal RV - Echo (6/22): EF 60%, mod MR/TR (per chart review at [place]). - Zio (8/19): no arrhythmias, occasional PAC/PVCs  - LHC (3/22): 3 vessel CAD with moderate mid-LAD and mid-RCA stenoses, and severe mid-LCx stenosis; successful PCI of the mid-circumflex, reducing severe 90% stenosis to 0% with a 2.75x22 mm Resolute Onyx DES, provisional side branch angioplasty of the OM1 with a 2.5 mm balloon through the stent struts  Review of systems complete and found to be negative unless listed in HPI.    Past Medical History:  Diagnosis Date   Acquired absence of left great toe [facility])    Acquired absence of other left toe(s) (HCC)    Anginal pain ([place])    Atherosclerotic heart disease of native coronary artery without angina pectoris    CAD (coronary artery disease)    08/2016 DES to RCA, also with history of LAD stenting.    Chronic diastolic (congestive) heart failure (HCC)    Chronic kidney disease, stage 2 (mild)    Chronic systolic (congestive) heart failure (HCC)    Contrast dye induced nephropathy, possible 09/03/2020   Diabetes (HCC)    Diastolic CHF (HCC)    Gastro-esophageal reflux disease without esophagitis    Hyperlipidemia    Hypertension    Hypertensive heart disease with heart failure (HCC)    Hypoxemia    Idiopathic gout, multiple sites    Localized edema    Low back pain    Mixed hyperlipidemia    Mixed incontinence    Morbid (severe) obesity due to excess calories (HCC)    Non-ST elevation (NSTEMI) myocardial infarction ([place]) 09/03/2020   OSA (obstructive sleep apnea)    OSA (obstructive sleep apnea) 09/03/2020   Other specified hypothyroidism    Primary insomnia    Type 2 diabetes mellitus with complication, without long-term current use of insulin ([place]) 09/03/2020   Type 2 diabetes mellitus with diabetic nephropathy (HCC)     Current Outpatient Medications  Medication Sig  Dispense Refill   allopurinol (ZYLOPRIM) 100 MG tablet Take 1 tablet (100 mg total) by mouth daily. 90 tablet 0   aspirin 81 MG chewable tablet Chew 81 mg by mouth daily.     BD PEN NEEDLE NANO 2ND GEN 32G X 4 MM MISC 4 (four) times daily.     carvedilol (COREG) 12.5 MG tablet Take 1 tablet (12.5 mg total) by mouth 2 (two) times daily. 60 tablet 11   Cholecalciferol (VITAMIN D) 50 MCG (2000 UT) tablet Take 2,000 Units by mouth daily. Take one daily     clopidogrel (PLAVIX) 75 MG tablet Take 1 tablet (75 mg total) by mouth at bedtime. 90 tablet 3   Cyanocobalamin 3000 MCG CAPS Take 2 capsules by mouth daily. Patient purchased 3000 mcg gummies and taking 2 gummies daily     EPINEPHrine 0.3 mg/0.3 mL IJ SOAJ injection Use as directed for life-threatening allergic reaction. 4 Device 3   Evolocumab (REPATHA SURECLICK) 983 MG/ML SOAJ Inject 1  Dose into the skin every 14 (fourteen) days. 6 mL 3   ezetimibe (ZETIA) 10 MG tablet Take 1 tablet (10 mg total) by mouth daily. 90 tablet 2   famotidine (PEPCID) 20 MG tablet Take 1 tablet (20 mg total) by mouth daily. 30 tablet 5   ferrous sulfate 325 (65 FE) MG tablet Take 325 mg by mouth 2 (two) times daily with a meal.     hydrALAZINE (APRESOLINE) 25 MG tablet Take 1 tablet (25 mg total) by mouth 3 (three) times daily. 90 tablet 0   HYDROcodone-acetaminophen (NORCO) 7.5-325 MG tablet Take 1 tablet by mouth 3 (three) times daily as needed for moderate pain. 90 tablet 0   insulin aspart (NOVOLOG) 100 UNIT/ML injection Inject 2-10 Units into the skin 3 (three) times daily before meals.     isosorbide mononitrate (IMDUR) 30 MG 24 hr tablet Take 1 tablet (30 mg total) by mouth daily. 30 tablet 0   lactulose (CHRONULAC) 10 GM/15ML solution Take 15 mLs (10 g total) by mouth daily as needed for mild constipation. 236 mL 0   latanoprost (XALATAN) 0.005 % ophthalmic solution Place 1 drop into both eyes at bedtime.     LEVEMIR FLEXTOUCH 100 UNIT/ML FlexPen Inject 0-50  Units into the skin daily.     LORazepam (ATIVAN) 0.5 MG tablet Take 1 tablet (0.5 mg total) by mouth daily as needed for anxiety. 30 tablet 2   magnesium oxide (MAG-OX) 400 MG tablet Take 2 tablets (800 mg total) by mouth daily. Take 2 (400 mg) daily=800 mg 60 tablet 1   nitroGLYCERIN (NITROSTAT) 0.4 MG SL tablet Place 1 tablet (0.4 mg total) under the tongue every 5 (five) minutes as needed for chest pain. 15 tablet 2   Omega-3 Fatty Acids (FISH OIL PO) Take 1 capsule by mouth daily.     OXYGEN Inhale 2 L into the lungs at bedtime.     pantoprazole (PROTONIX) 40 MG tablet Take 1 tablet (40 mg total) by mouth daily. 30 tablet 3   Probiotic CAPS Take 1 capsule by mouth daily.     promethazine (PHENERGAN) 25 MG tablet Take 1 tablet (25 mg total) by mouth every 6 (six) hours as needed for nausea or vomiting. 90 tablet 1   torsemide (DEMADEX) 20 MG tablet Take 1 tablet (20 mg total) by mouth daily. 30 tablet 3   No current facility-administered medications for this encounter.   Allergies  Allergen Reactions   Naproxen Hives and Rash   Celecoxib Other (See Comments)    Stomach pain   Aspirin Other (See Comments)    CAN NOT TAKE DUE TO ULCERS (documented previously) but takes every day currently (as of 09/29/20) without reported issues   Glucosamine Hives, Swelling and Other (See Comments)    Angioedema    Iron     IV iron transfusion - hives - Feb 2022 hospitalization   Metoclopramide Other (See Comments)    Facial twitching and stuttering   Metolazone Other (See Comments)    Acute renal failure   Shellfish-Derived Products Hives, Swelling and Other (See Comments)    Angioedema    Tramadol Nausea And Vomiting        Social History   Socioeconomic History   Marital status: Married    Spouse name: Not on file   Number of children: Not on file   Years of education: Not on file   Highest education level: Not on file  Occupational History   Not on file  Tobacco Use   Smoking  status: Former   Smokeless tobacco: Never  Scientific laboratory technician Use: Never used  Substance and Sexual Activity   Alcohol use: Not Currently   Drug use: Not Currently   Sexual activity: Not on file  Other Topics Concern   Not on file  Social History Narrative   Not on file   Social Determinants of Health   Financial Resource Strain: Not on file  Food Insecurity: No Food Insecurity   Worried About Running Out of Food in the Last Year: Never true   [place] in the Last Year: Never true  Transportation Needs: No Transportation Needs   Lack of Transportation (Medical): No   Lack of Transportation (Non-Medical): No  Physical Activity: Not on file  Stress: Not on file  Social Connections: Not on file  Intimate Partner Violence: Not on file   Family History  Problem Relation Age of Onset   Hypertension Father    Coronary artery disease Mother    Hypertension Mother    Hyperlipidemia Mother    Diabetes type II Mother    Breast cancer Mother    Lung cancer Mother    Bipolar disorder Other    Depression Other    Schizophrenia Other    BP 104/62   Pulse 69   Wt 109 kg (240 lb 6.4 oz)   SpO2 97%   BMI 40.00 kg/m   Wt Readings from Last 3 Encounters:  04/12/21 109 kg (240 lb 6.4 oz)  04/04/21 106.6 kg (235 lb)  03/15/21 106.1 kg (233 lb 12.8 oz)   PHYSICAL EXAM: General: NAD. No resp difficulty, arrived in [facility] HEENT: Normal Neck: Supple. JVP to ear. Carotids 2+ bilat; no bruits. No lymphadenopathy or thryomegaly appreciated. Cor: PMI nondisplaced. Regular rate & rhythm. No rubs, gallops or murmurs. Lungs: Clear Abdomen: Obese, nontender, nondistended. No hepatosplenomegaly. No bruits or masses. Good bowel sounds. Extremities: No cyanosis, clubbing, rash, trace LE edema, compression hose on. Neuro: Alert & oriented x 3, cranial nerves grossly intact. Moves all 4 extremities w/o difficulty. Affect pleasant.  ECG: SR 68 bpm (personally reviewed).  CardioMEMs: Goal  dPAP: 10  Today's dPAP: 16   ASSESSMENT & PLAN:  1. Acute on Chronic Diastolic Heart Failure - Echo 08/2020 EF 55-60%  - Echo (3/22): EF 55-60%, Grade II DD, normal RV - Echo (6/22): EF 60%, mod MR/TR (per chart review at [place]). - Worse NYHA Class III-IIIb, she is volume overloaded on exam and CardioMEMS elevated with PAD of 16, goal 10. - Will give lasix 80 mg IV x 1 + 20 mEq of KCl in clinic now. - Tomorrow, resume torsemide 20 mg daily. - Continue carvedilol 12.5 mg bid. - Continue hydralazine 25 mg tid + Imdur 30 mg daily. - No SGLT2i with T1DM. - BMET & BNP today.  2. CAD - Known CAD with prior stents to mid LAD and pRCA.  - NSTEMI (2/22). Cath deferred due to AKI. Treated medically.  - NSTEMI (7/22)-->LHC and received PCI/DES to mid circ, balloon angioplasty to OM1  - No s/s ischemia. - Continue ASA + Plavix for a minimum of 12 months. - Lipids under good control w/ Repatha + Zetia (LDL 66 7/22). Followed by Dr. Debara Pickett.   3. CKD IV w/ CIN  - Followed by Nephrology. - SCr baseline 1.5. - BMET today.   4. Palpitations - Zio XT 14 day (8/22) showed mostly SB/SR no afib, 66 SVT runs, frequent  isolated SVE (11.2%). - Continue beta blocker. Discussed with Dr. [name].  5. Anoxic brain injury - 2/2 PEA arrest, ? respiratory driven. - Memory improving.  - Continue SLP.  6. OSA - Continue CPAP nightly.   7. DM1 - Hgb A1c 6.3 7/22. - Managed by PCP.  8. Chronic Anemia - Baseline Hgb 8-9 range, likely 2/2 renal disease. - Hgb 8.3 (8/22)  9. Left-sided weakness - Continue with PT/OT.  Follow up in 10-14 days with APP to reassess volume. If symptoms worsen or fail to improve with increasing SCr, may need to consider RHC.  [place], [place] 04/12/21 10:22 AM

## 2021-04-11 NOTE — Addendum Note (Signed)
Encounter addended by: Micki Riley, RN on: 04/11/2021 8:52 AM  Actions taken: Imaging Exam ended

## 2021-04-12 ENCOUNTER — Encounter (HOSPITAL_COMMUNITY): Payer: Self-pay

## 2021-04-12 ENCOUNTER — Other Ambulatory Visit: Payer: Self-pay

## 2021-04-12 ENCOUNTER — Ambulatory Visit (HOSPITAL_COMMUNITY)
Admission: RE | Admit: 2021-04-12 | Discharge: 2021-04-12 | Disposition: A | Discharge status: Home / Self Care | Payer: HMO | Source: Ambulatory Visit | Attending: Family Medicine | Admitting: Family Medicine

## 2021-04-12 VITALS — BP 104/62 | HR 69 | Wt 240.4 lb

## 2021-04-12 DIAGNOSIS — G4733 Obstructive sleep apnea (adult) (pediatric): Secondary | ICD-10-CM | POA: Insufficient documentation

## 2021-04-12 DIAGNOSIS — E1022 Type 1 diabetes mellitus with diabetic chronic kidney disease: Secondary | ICD-10-CM | POA: Diagnosis not present

## 2021-04-12 DIAGNOSIS — R531 Weakness: Secondary | ICD-10-CM | POA: Diagnosis not present

## 2021-04-12 DIAGNOSIS — G931 Anoxic brain damage, not elsewhere classified: Secondary | ICD-10-CM

## 2021-04-12 DIAGNOSIS — I5042 Chronic combined systolic (congestive) and diastolic (congestive) heart failure: Secondary | ICD-10-CM | POA: Diagnosis not present

## 2021-04-12 DIAGNOSIS — N184 Chronic kidney disease, stage 4 (severe): Secondary | ICD-10-CM | POA: Diagnosis not present

## 2021-04-12 DIAGNOSIS — Z7982 Long term (current) use of aspirin: Secondary | ICD-10-CM | POA: Insufficient documentation

## 2021-04-12 DIAGNOSIS — N179 Acute kidney failure, unspecified: Secondary | ICD-10-CM | POA: Insufficient documentation

## 2021-04-12 DIAGNOSIS — Z888 Allergy status to other drugs, medicaments and biological substances status: Secondary | ICD-10-CM | POA: Diagnosis not present

## 2021-04-12 DIAGNOSIS — N879 Dysplasia of cervix uteri, unspecified: Secondary | ICD-10-CM | POA: Insufficient documentation

## 2021-04-12 DIAGNOSIS — I5032 Chronic diastolic (congestive) heart failure: Secondary | ICD-10-CM

## 2021-04-12 DIAGNOSIS — I130 Hypertensive heart and chronic kidney disease with heart failure and stage 1 through stage 4 chronic kidney disease, or unspecified chronic kidney disease: Secondary | ICD-10-CM | POA: Insufficient documentation

## 2021-04-12 DIAGNOSIS — E1043 Type 1 diabetes mellitus with diabetic autonomic (poly)neuropathy: Secondary | ICD-10-CM | POA: Insufficient documentation

## 2021-04-12 DIAGNOSIS — D539 Nutritional anemia, unspecified: Secondary | ICD-10-CM | POA: Diagnosis not present

## 2021-04-12 DIAGNOSIS — Z833 Family history of diabetes mellitus: Secondary | ICD-10-CM | POA: Diagnosis not present

## 2021-04-12 DIAGNOSIS — Z794 Long term (current) use of insulin: Secondary | ICD-10-CM | POA: Insufficient documentation

## 2021-04-12 DIAGNOSIS — I2510 Atherosclerotic heart disease of native coronary artery without angina pectoris: Secondary | ICD-10-CM

## 2021-04-12 DIAGNOSIS — I469 Cardiac arrest, cause unspecified: Secondary | ICD-10-CM | POA: Insufficient documentation

## 2021-04-12 DIAGNOSIS — Z79899 Other long term (current) drug therapy: Secondary | ICD-10-CM | POA: Diagnosis not present

## 2021-04-12 DIAGNOSIS — I5033 Acute on chronic diastolic (congestive) heart failure: Secondary | ICD-10-CM | POA: Diagnosis not present

## 2021-04-12 DIAGNOSIS — R002 Palpitations: Secondary | ICD-10-CM | POA: Diagnosis not present

## 2021-04-12 DIAGNOSIS — I214 Non-ST elevation (NSTEMI) myocardial infarction: Secondary | ICD-10-CM | POA: Diagnosis not present

## 2021-04-12 DIAGNOSIS — Z885 Allergy status to narcotic agent status: Secondary | ICD-10-CM | POA: Insufficient documentation

## 2021-04-12 DIAGNOSIS — Z8249 Family history of ischemic heart disease and other diseases of the circulatory system: Secondary | ICD-10-CM | POA: Diagnosis not present

## 2021-04-12 DIAGNOSIS — Z886 Allergy status to analgesic agent status: Secondary | ICD-10-CM | POA: Diagnosis not present

## 2021-04-12 DIAGNOSIS — Z7902 Long term (current) use of antithrombotics/antiplatelets: Secondary | ICD-10-CM | POA: Diagnosis not present

## 2021-04-12 DIAGNOSIS — D509 Iron deficiency anemia, unspecified: Secondary | ICD-10-CM | POA: Diagnosis not present

## 2021-04-12 DIAGNOSIS — Z955 Presence of coronary angioplasty implant and graft: Secondary | ICD-10-CM | POA: Diagnosis not present

## 2021-04-12 LAB — BASIC METABOLIC PANEL
Anion gap: 8 (ref 5–15)
BUN: 64 mg/dL — ABNORMAL HIGH (ref 6–20)
CO2: 25 mmol/L (ref 22–32)
Calcium: 8.8 mg/dL — ABNORMAL LOW (ref 8.9–10.3)
Chloride: 103 mmol/L (ref 98–111)
Creatinine, Ser: 2.37 mg/dL — ABNORMAL HIGH (ref 0.44–1.00)
GFR, Estimated: 23 mL/min — ABNORMAL LOW (ref >60)
Glucose, Bld: 182 mg/dL — ABNORMAL HIGH (ref 70–99)
Potassium: 4.8 mmol/L (ref 3.5–5.1)
Sodium: 136 mmol/L (ref 135–145)

## 2021-04-12 LAB — BRAIN NATRIURETIC PEPTIDE: B Natriuretic Peptide: 353.3 pg/mL — ABNORMAL HIGH (ref 0.0–100.0)

## 2021-04-12 MED ORDER — FUROSEMIDE 10 MG/ML IJ SOLN
80.0000 mg | Freq: Once | INTRAMUSCULAR | Status: AC
  Administered 2021-04-12: 80 mg via INTRAVENOUS

## 2021-04-12 MED ORDER — POTASSIUM CHLORIDE CRYS ER 20 MEQ PO TBCR
20.0000 meq | EXTENDED_RELEASE_TABLET | Freq: Once | ORAL | Status: AC
  Administered 2021-04-12: 20 meq via ORAL

## 2021-04-12 NOTE — Patient Instructions (Addendum)
It was great to see you today! No medication changes are needed at this time.  Labs today We will only contact you if something comes back abnormal or we need to make some changes. Otherwise no news is good news!  Your physician recommends that you schedule a follow-up appointment in: 10-14 days  in the Advanced Practitioners (PA/NP) Clinic  and in 3 months with Dr [name]  Do the following things EVERYDAY: Weigh yourself in the morning before breakfast. Write it down and keep it in a log. Take your medicines as prescribed Eat low salt foods--Limit salt (sodium) to 2000 mg per day.  Stay as active as you can everyday Limit all fluids for the day to less than 2 liters  At the [facility], you and your health needs are our priority. As part of our continuing mission to provide you with exceptional heart care, we have created designated Provider Care Teams. These Care Teams include your primary Cardiologist (physician) and Advanced Practice Providers (APPs- Physician Assistants and Nurse Practitioners) who all work together to provide you with the care you need, when you need it.   You may see any of the following providers on your designated Care Team at your next follow up: Dr [name] Dr Loralie Champagne Dr Patrice Paradise, NP Lyda Jester, [place] Ginnie Smart Audry Riles, PharmD   Please be sure to bring in all your medications bottles to every appointment.

## 2021-04-17 ENCOUNTER — Encounter (HOSPITAL_COMMUNITY): Payer: Self-pay

## 2021-04-17 NOTE — Telephone Encounter (Signed)
Pt sent a message c/o increased shortness of breath and asked what her cardiomems reading was. Reading today was 17.  Please see below and advise.  Routed to [employer] for advice

## 2021-04-18 ENCOUNTER — Encounter: Payer: HMO | Admitting: Physical Medicine and Rehabilitation

## 2021-04-18 ENCOUNTER — Telehealth: Payer: Self-pay | Admitting: Physical Medicine and Rehabilitation

## 2021-04-18 NOTE — Telephone Encounter (Signed)
Called patient back and rescheduled appointment.

## 2021-04-18 NOTE — Telephone Encounter (Signed)
Pt called and is throwing up this morning and will not be able to make it in for her 10:00 appt. She would like to reschedule.   CB [phone]

## 2021-04-19 ENCOUNTER — Encounter: Payer: Self-pay | Admitting: Family Medicine

## 2021-04-19 DIAGNOSIS — N1832 Chronic kidney disease, stage 3b: Secondary | ICD-10-CM | POA: Diagnosis not present

## 2021-04-19 DIAGNOSIS — D631 Anemia in chronic kidney disease: Secondary | ICD-10-CM | POA: Diagnosis not present

## 2021-04-19 DIAGNOSIS — I469 Cardiac arrest, cause unspecified: Secondary | ICD-10-CM | POA: Insufficient documentation

## 2021-04-19 NOTE — Assessment & Plan Note (Signed)
The current medical regimen is effective;  continue present plan and medications. Management per specialist.   

## 2021-04-19 NOTE — Assessment & Plan Note (Signed)
Management per specialist.  Control: fair Recommend check sugars fasting daily. Recommend check feet daily. Recommend annual eye exams. Medicines: no changes Continue to work on eating a healthy diet and exercise.

## 2021-04-19 NOTE — Assessment & Plan Note (Signed)
Intolerant to statins. Causes muscle pain.

## 2021-04-19 NOTE — Assessment & Plan Note (Signed)
Pt was resuscitated in the ambulance on the way to the hospital. Doing well.

## 2021-04-19 NOTE — Telephone Encounter (Signed)
Routed to [employer]. Please  see note from pt and cardiomems reading below.

## 2021-04-24 ENCOUNTER — Other Ambulatory Visit: Payer: Self-pay

## 2021-04-24 ENCOUNTER — Telehealth: Payer: Self-pay | Admitting: Physical Medicine and Rehabilitation

## 2021-04-24 ENCOUNTER — Encounter: Payer: Self-pay | Admitting: *Deleted

## 2021-04-24 ENCOUNTER — Inpatient Hospital Stay (HOSPITAL_COMMUNITY)
Admission: AD | Admit: 2021-04-24 | Discharge: 2021-04-29 | DRG: 286 | Disposition: A | Discharge status: Home / Self Care | Payer: HMO | Source: Ambulatory Visit | Attending: Internal Medicine | Admitting: Internal Medicine

## 2021-04-24 ENCOUNTER — Encounter (HOSPITAL_COMMUNITY): Payer: Self-pay

## 2021-04-24 ENCOUNTER — Ambulatory Visit (HOSPITAL_COMMUNITY)
Admission: RE | Admit: 2021-04-24 | Discharge: 2021-04-24 | Disposition: A | Discharge status: Home / Self Care | Payer: HMO | Source: Ambulatory Visit | Attending: Cardiology | Admitting: Cardiology

## 2021-04-24 VITALS — BP 137/60 | HR 76 | Wt 243.4 lb

## 2021-04-24 DIAGNOSIS — Z885 Allergy status to narcotic agent status: Secondary | ICD-10-CM | POA: Insufficient documentation

## 2021-04-24 DIAGNOSIS — I480 Paroxysmal atrial fibrillation: Secondary | ICD-10-CM | POA: Insufficient documentation

## 2021-04-24 DIAGNOSIS — R600 Localized edema: Secondary | ICD-10-CM | POA: Diagnosis present

## 2021-04-24 DIAGNOSIS — Z87891 Personal history of nicotine dependence: Secondary | ICD-10-CM | POA: Insufficient documentation

## 2021-04-24 DIAGNOSIS — I5033 Acute on chronic diastolic (congestive) heart failure: Secondary | ICD-10-CM | POA: Diagnosis not present

## 2021-04-24 DIAGNOSIS — E782 Mixed hyperlipidemia: Secondary | ICD-10-CM | POA: Diagnosis not present

## 2021-04-24 DIAGNOSIS — I5031 Acute diastolic (congestive) heart failure: Secondary | ICD-10-CM | POA: Diagnosis not present

## 2021-04-24 DIAGNOSIS — N1832 Chronic kidney disease, stage 3b: Secondary | ICD-10-CM | POA: Diagnosis present

## 2021-04-24 DIAGNOSIS — Z955 Presence of coronary angioplasty implant and graft: Secondary | ICD-10-CM | POA: Insufficient documentation

## 2021-04-24 DIAGNOSIS — Z9981 Dependence on supplemental oxygen: Secondary | ICD-10-CM

## 2021-04-24 DIAGNOSIS — Z794 Long term (current) use of insulin: Secondary | ICD-10-CM

## 2021-04-24 DIAGNOSIS — Z888 Allergy status to other drugs, medicaments and biological substances status: Secondary | ICD-10-CM | POA: Diagnosis not present

## 2021-04-24 DIAGNOSIS — Z9049 Acquired absence of other specified parts of digestive tract: Secondary | ICD-10-CM

## 2021-04-24 DIAGNOSIS — J449 Chronic obstructive pulmonary disease, unspecified: Secondary | ICD-10-CM | POA: Diagnosis present

## 2021-04-24 DIAGNOSIS — Z886 Allergy status to analgesic agent status: Secondary | ICD-10-CM | POA: Insufficient documentation

## 2021-04-24 DIAGNOSIS — Z006 Encounter for examination for normal comparison and control in clinical research program: Secondary | ICD-10-CM

## 2021-04-24 DIAGNOSIS — Z90710 Acquired absence of both cervix and uterus: Secondary | ICD-10-CM | POA: Diagnosis not present

## 2021-04-24 DIAGNOSIS — I130 Hypertensive heart and chronic kidney disease with heart failure and stage 1 through stage 4 chronic kidney disease, or unspecified chronic kidney disease: Secondary | ICD-10-CM | POA: Diagnosis not present

## 2021-04-24 DIAGNOSIS — M1000 Idiopathic gout, unspecified site: Secondary | ICD-10-CM | POA: Diagnosis present

## 2021-04-24 DIAGNOSIS — Z7902 Long term (current) use of antithrombotics/antiplatelets: Secondary | ICD-10-CM

## 2021-04-24 DIAGNOSIS — N179 Acute kidney failure, unspecified: Secondary | ICD-10-CM | POA: Diagnosis not present

## 2021-04-24 DIAGNOSIS — Z9989 Dependence on other enabling machines and devices: Secondary | ICD-10-CM | POA: Diagnosis not present

## 2021-04-24 DIAGNOSIS — I252 Old myocardial infarction: Secondary | ICD-10-CM | POA: Insufficient documentation

## 2021-04-24 DIAGNOSIS — R0602 Shortness of breath: Secondary | ICD-10-CM | POA: Diagnosis not present

## 2021-04-24 DIAGNOSIS — E1043 Type 1 diabetes mellitus with diabetic autonomic (poly)neuropathy: Secondary | ICD-10-CM | POA: Diagnosis present

## 2021-04-24 DIAGNOSIS — D631 Anemia in chronic kidney disease: Secondary | ICD-10-CM | POA: Diagnosis present

## 2021-04-24 DIAGNOSIS — Z8679 Personal history of other diseases of the circulatory system: Secondary | ICD-10-CM

## 2021-04-24 DIAGNOSIS — Z6841 Body Mass Index (BMI) 40.0 and over, adult: Secondary | ICD-10-CM

## 2021-04-24 DIAGNOSIS — I5043 Acute on chronic combined systolic (congestive) and diastolic (congestive) heart failure: Secondary | ICD-10-CM | POA: Diagnosis not present

## 2021-04-24 DIAGNOSIS — I517 Cardiomegaly: Secondary | ICD-10-CM | POA: Diagnosis not present

## 2021-04-24 DIAGNOSIS — I25118 Atherosclerotic heart disease of native coronary artery with other forms of angina pectoris: Secondary | ICD-10-CM | POA: Diagnosis present

## 2021-04-24 DIAGNOSIS — Z801 Family history of malignant neoplasm of trachea, bronchus and lung: Secondary | ICD-10-CM

## 2021-04-24 DIAGNOSIS — I472 Ventricular tachycardia, unspecified: Secondary | ICD-10-CM | POA: Diagnosis present

## 2021-04-24 DIAGNOSIS — F5101 Primary insomnia: Secondary | ICD-10-CM | POA: Diagnosis present

## 2021-04-24 DIAGNOSIS — Z83438 Family history of other disorder of lipoprotein metabolism and other lipidemia: Secondary | ICD-10-CM

## 2021-04-24 DIAGNOSIS — G4733 Obstructive sleep apnea (adult) (pediatric): Secondary | ICD-10-CM | POA: Diagnosis not present

## 2021-04-24 DIAGNOSIS — Z833 Family history of diabetes mellitus: Secondary | ICD-10-CM

## 2021-04-24 DIAGNOSIS — Z79899 Other long term (current) drug therapy: Secondary | ICD-10-CM

## 2021-04-24 DIAGNOSIS — Z7982 Long term (current) use of aspirin: Secondary | ICD-10-CM

## 2021-04-24 DIAGNOSIS — E1022 Type 1 diabetes mellitus with diabetic chronic kidney disease: Secondary | ICD-10-CM | POA: Diagnosis not present

## 2021-04-24 DIAGNOSIS — I340 Nonrheumatic mitral (valve) insufficiency: Secondary | ICD-10-CM | POA: Diagnosis not present

## 2021-04-24 DIAGNOSIS — J9611 Chronic respiratory failure with hypoxia: Secondary | ICD-10-CM | POA: Diagnosis present

## 2021-04-24 DIAGNOSIS — D649 Anemia, unspecified: Secondary | ICD-10-CM | POA: Diagnosis not present

## 2021-04-24 DIAGNOSIS — E038 Other specified hypothyroidism: Secondary | ICD-10-CM | POA: Diagnosis not present

## 2021-04-24 DIAGNOSIS — I2510 Atherosclerotic heart disease of native coronary artery without angina pectoris: Secondary | ICD-10-CM | POA: Diagnosis not present

## 2021-04-24 DIAGNOSIS — I509 Heart failure, unspecified: Secondary | ICD-10-CM | POA: Diagnosis not present

## 2021-04-24 DIAGNOSIS — Z8249 Family history of ischemic heart disease and other diseases of the circulatory system: Secondary | ICD-10-CM

## 2021-04-24 DIAGNOSIS — I083 Combined rheumatic disorders of mitral, aortic and tricuspid valves: Secondary | ICD-10-CM | POA: Diagnosis not present

## 2021-04-24 DIAGNOSIS — Z8674 Personal history of sudden cardiac arrest: Secondary | ICD-10-CM | POA: Insufficient documentation

## 2021-04-24 DIAGNOSIS — Q2112 Patent foramen ovale: Secondary | ICD-10-CM | POA: Diagnosis not present

## 2021-04-24 DIAGNOSIS — K219 Gastro-esophageal reflux disease without esophagitis: Secondary | ICD-10-CM | POA: Diagnosis present

## 2021-04-24 DIAGNOSIS — E1142 Type 2 diabetes mellitus with diabetic polyneuropathy: Secondary | ICD-10-CM | POA: Diagnosis present

## 2021-04-24 DIAGNOSIS — G720 Drug-induced myopathy: Secondary | ICD-10-CM | POA: Diagnosis not present

## 2021-04-24 DIAGNOSIS — N184 Chronic kidney disease, stage 4 (severe): Secondary | ICD-10-CM | POA: Diagnosis present

## 2021-04-24 DIAGNOSIS — Z818 Family history of other mental and behavioral disorders: Secondary | ICD-10-CM

## 2021-04-24 DIAGNOSIS — Z803 Family history of malignant neoplasm of breast: Secondary | ICD-10-CM

## 2021-04-24 DIAGNOSIS — Z20822 Contact with and (suspected) exposure to covid-19: Secondary | ICD-10-CM | POA: Diagnosis present

## 2021-04-24 DIAGNOSIS — Z91013 Allergy to seafood: Secondary | ICD-10-CM

## 2021-04-24 DIAGNOSIS — T466X5A Adverse effect of antihyperlipidemic and antiarteriosclerotic drugs, initial encounter: Secondary | ICD-10-CM | POA: Diagnosis present

## 2021-04-24 LAB — CBC
HCT: 28.6 % — ABNORMAL LOW (ref 36.0–46.0)
HCT: 28.3 % — ABNORMAL LOW (ref 36.0–46.0)
Hemoglobin: 8.5 g/dL — ABNORMAL LOW (ref 12.0–15.0)
Hemoglobin: 8.4 g/dL — ABNORMAL LOW (ref 12.0–15.0)
MCH: 26.8 pg (ref 26.0–34.0)
MCH: 26.8 pg (ref 26.0–34.0)
MCHC: 29.7 g/dL — ABNORMAL LOW (ref 30.0–36.0)
MCHC: 29.7 g/dL — ABNORMAL LOW (ref 30.0–36.0)
MCV: 90.2 fL (ref 80.0–100.0)
MCV: 90.1 fL (ref 80.0–100.0)
Platelets: 302 10*3/uL (ref 150–400)
Platelets: 320 10*3/uL (ref 150–400)
RBC: 3.17 MIL/uL — ABNORMAL LOW (ref 3.87–5.11)
RBC: 3.14 MIL/uL — ABNORMAL LOW (ref 3.87–5.11)
RDW: 15.5 % (ref 11.5–15.5)
RDW: 15.5 % (ref 11.5–15.5)
WBC: 11.7 10*3/uL — ABNORMAL HIGH (ref 4.0–10.5)
WBC: 11.1 10*3/uL — ABNORMAL HIGH (ref 4.0–10.5)
nRBC: 0 % (ref 0.0–0.2)
nRBC: 0 % (ref 0.0–0.2)

## 2021-04-24 LAB — TSH: TSH: 4.014 u[IU]/mL (ref 0.350–4.500)

## 2021-04-24 LAB — COMPREHENSIVE METABOLIC PANEL
ALT: 15 U/L (ref 0–44)
ALT: 15 U/L (ref 0–44)
AST: 17 U/L (ref 15–41)
AST: 15 U/L (ref 15–41)
Albumin: 3.4 g/dL — ABNORMAL LOW (ref 3.5–5.0)
Albumin: 3.3 g/dL — ABNORMAL LOW (ref 3.5–5.0)
Alkaline Phosphatase: 96 U/L (ref 38–126)
Alkaline Phosphatase: 86 U/L (ref 38–126)
Anion gap: 9 (ref 5–15)
Anion gap: 12 (ref 5–15)
BUN: 51 mg/dL — ABNORMAL HIGH (ref 6–20)
BUN: 53 mg/dL — ABNORMAL HIGH (ref 6–20)
CO2: 25 mmol/L (ref 22–32)
CO2: 23 mmol/L (ref 22–32)
Calcium: 8.5 mg/dL — ABNORMAL LOW (ref 8.9–10.3)
Calcium: 8.5 mg/dL — ABNORMAL LOW (ref 8.9–10.3)
Chloride: 104 mmol/L (ref 98–111)
Chloride: 104 mmol/L (ref 98–111)
Creatinine, Ser: 2.36 mg/dL — ABNORMAL HIGH (ref 0.44–1.00)
Creatinine, Ser: 2.29 mg/dL — ABNORMAL HIGH (ref 0.44–1.00)
GFR, Estimated: 23 mL/min — ABNORMAL LOW (ref >60)
GFR, Estimated: 24 mL/min — ABNORMAL LOW (ref >60)
Glucose, Bld: 67 mg/dL — ABNORMAL LOW (ref 70–99)
Glucose, Bld: 122 mg/dL — ABNORMAL HIGH (ref 70–99)
Potassium: 4.5 mmol/L (ref 3.5–5.1)
Potassium: 4.2 mmol/L (ref 3.5–5.1)
Sodium: 138 mmol/L (ref 135–145)
Sodium: 139 mmol/L (ref 135–145)
Total Bilirubin: 0.9 mg/dL (ref 0.3–1.2)
Total Bilirubin: 0.8 mg/dL (ref 0.3–1.2)
Total Protein: 7.3 g/dL (ref 6.5–8.1)
Total Protein: 7.1 g/dL (ref 6.5–8.1)

## 2021-04-24 LAB — MRSA NEXT GEN BY PCR, NASAL: MRSA by PCR Next Gen: NOT DETECTED

## 2021-04-24 LAB — TROPONIN I (HIGH SENSITIVITY)
Troponin I (High Sensitivity): 55 ng/L — ABNORMAL HIGH (ref <18)
Troponin I (High Sensitivity): 58 ng/L — ABNORMAL HIGH (ref <18)

## 2021-04-24 LAB — RESP PANEL BY RT-PCR (FLU A&B, COVID) ARPGX2
Influenza A by PCR: NEGATIVE
Influenza B by PCR: NEGATIVE
SARS Coronavirus 2 by RT PCR: NEGATIVE

## 2021-04-24 LAB — MAGNESIUM
Magnesium: 2.4 mg/dL (ref 1.7–2.4)
Magnesium: 2.3 mg/dL (ref 1.7–2.4)

## 2021-04-24 LAB — GLUCOSE, CAPILLARY: Glucose-Capillary: 125 mg/dL — ABNORMAL HIGH (ref 70–99)

## 2021-04-24 LAB — CREATININE, SERUM
Creatinine, Ser: 2.23 mg/dL — ABNORMAL HIGH (ref 0.44–1.00)
GFR, Estimated: 25 mL/min — ABNORMAL LOW (ref >60)

## 2021-04-24 MED ORDER — INSULIN ASPART 100 UNIT/ML ~~LOC~~ SOLN: 2.0000 [IU] | Freq: Three times a day (TID) | SUBCUTANEOUS | Status: DC

## 2021-04-24 MED ORDER — INSULIN DETEMIR 100 UNIT/ML ~~LOC~~ SOLN
50.0000 [IU] | Freq: Every day | SUBCUTANEOUS | Status: DC
  Filled 2021-04-24: qty 0.5

## 2021-04-24 MED ORDER — STUDY - FASTR - FUROSEMIDE (LASIX) 10 MG/ML IV INFUSION (PI-BENISMHON)
4.0000 mg/h | INTRAVENOUS | Status: DC
Start: 2021-04-24 — End: 2021-04-26
  Administered 2021-04-24 – 2021-04-26 (×3): 40 mg/h via INTRAVENOUS
  Filled 2021-04-24 (×5): qty 50

## 2021-04-24 MED ORDER — ENOXAPARIN SODIUM 40 MG/0.4ML IJ SOSY
40.0000 mg | PREFILLED_SYRINGE | INTRAMUSCULAR | Status: DC
  Administered 2021-04-25 – 2021-04-26 (×2): 40 mg via SUBCUTANEOUS
  Filled 2021-04-24 (×2): qty 0.4

## 2021-04-24 MED ORDER — SODIUM CHLORIDE 0.9% FLUSH
3.0000 mL | INTRAVENOUS | Status: DC | PRN
Start: 2021-04-24 — End: 2021-04-27

## 2021-04-24 MED ORDER — HEPARIN SODIUM (PORCINE) 5000 UNIT/ML IJ SOLN: 5000.0000 [IU] | Freq: Three times a day (TID) | INTRAMUSCULAR | Status: DC

## 2021-04-24 MED ORDER — HYDRALAZINE HCL 25 MG PO TABS
25.0000 mg | ORAL_TABLET | Freq: Three times a day (TID) | ORAL | Status: DC
  Administered 2021-04-24 – 2021-04-29 (×12): 25 mg via ORAL
  Filled 2021-04-24 (×13): qty 1

## 2021-04-24 MED ORDER — ALLOPURINOL 100 MG PO TABS
100.0000 mg | ORAL_TABLET | Freq: Every day | ORAL | Status: DC
  Administered 2021-04-25 – 2021-04-29 (×5): 100 mg via ORAL
  Filled 2021-04-24 (×5): qty 1

## 2021-04-24 MED ORDER — INSULIN DETEMIR 100 UNIT/ML FLEXPEN: 0.0000 [IU] | PEN_INJECTOR | Freq: Every day | SUBCUTANEOUS | Status: DC

## 2021-04-24 MED ORDER — STUDY - FASTR - FUROSEMIDE (LASIX) 10 MG/ML IV BOLUS VIA INFUSION (PI-BENISMHON)
0.0000 mg | Freq: Once | INTRAVENOUS | Status: AC
  Administered 2021-04-24: 200 mg via INTRAVENOUS
  Filled 2021-04-24 (×2): qty 20

## 2021-04-24 MED ORDER — LORAZEPAM 0.5 MG PO TABS
0.5000 mg | ORAL_TABLET | Freq: Every day | ORAL | Status: DC | PRN
  Administered 2021-04-24 – 2021-04-28 (×5): 0.5 mg via ORAL
  Filled 2021-04-24 (×5): qty 1

## 2021-04-24 MED ORDER — NITROGLYCERIN 0.4 MG SL SUBL
0.4000 mg | SUBLINGUAL_TABLET | SUBLINGUAL | Status: DC | PRN
  Administered 2021-04-26 (×2): 0.4 mg via SUBLINGUAL
  Filled 2021-04-24: qty 1

## 2021-04-24 MED ORDER — PANTOPRAZOLE SODIUM 40 MG PO TBEC
40.0000 mg | DELAYED_RELEASE_TABLET | Freq: Every day | ORAL | Status: DC
  Administered 2021-04-25 – 2021-04-29 (×5): 40 mg via ORAL
  Filled 2021-04-24 (×5): qty 1

## 2021-04-24 MED ORDER — SODIUM CHLORIDE 0.9% FLUSH
3.0000 mL | Freq: Two times a day (BID) | INTRAVENOUS | Status: DC
Start: 2021-04-24 — End: 2021-04-27
  Administered 2021-04-25 – 2021-04-27 (×4): 3 mL via INTRAVENOUS

## 2021-04-24 MED ORDER — ACETAMINOPHEN 325 MG PO TABS: 650.0000 mg | ORAL_TABLET | ORAL | Status: DC | PRN

## 2021-04-24 MED ORDER — INSULIN ASPART 100 UNIT/ML IJ SOLN
0.0000 [IU] | Freq: Three times a day (TID) | INTRAMUSCULAR | Status: DC
Start: 2021-04-25 — End: 2021-04-27
  Administered 2021-04-26 (×2): 15 [IU] via SUBCUTANEOUS
  Administered 2021-04-27: 5 [IU] via SUBCUTANEOUS

## 2021-04-24 MED ORDER — ASPIRIN EC 81 MG PO TBEC
81.0000 mg | DELAYED_RELEASE_TABLET | Freq: Every day | ORAL | Status: DC
  Administered 2021-04-25 – 2021-04-29 (×4): 81 mg via ORAL
  Filled 2021-04-24 (×5): qty 1

## 2021-04-24 MED ORDER — STUDY - FASTR - FUROSEMIDE (LASIX) 10 MG/ML IV BOLUS VIA INFUSION (PI-BENISMHON): 0.0000 mg | Freq: Once | INTRAVENOUS | Status: DC

## 2021-04-24 MED ORDER — ISOSORBIDE MONONITRATE ER 30 MG PO TB24
30.0000 mg | ORAL_TABLET | Freq: Every day | ORAL | Status: DC
  Administered 2021-04-25 – 2021-04-26 (×2): 30 mg via ORAL
  Filled 2021-04-24 (×2): qty 1

## 2021-04-24 MED ORDER — HYDROCODONE-ACETAMINOPHEN 7.5-325 MG PO TABS
1.0000 | ORAL_TABLET | Freq: Three times a day (TID) | ORAL | Status: DC | PRN
  Administered 2021-04-24 – 2021-04-29 (×10): 1 via ORAL
  Filled 2021-04-24 (×10): qty 1

## 2021-04-24 MED ORDER — CARVEDILOL 12.5 MG PO TABS
12.5000 mg | ORAL_TABLET | Freq: Two times a day (BID) | ORAL | Status: DC
  Administered 2021-04-24 – 2021-04-29 (×9): 12.5 mg via ORAL
  Filled 2021-04-24 (×10): qty 1

## 2021-04-24 MED ORDER — CLOPIDOGREL BISULFATE 75 MG PO TABS
75.0000 mg | ORAL_TABLET | Freq: Every day | ORAL | Status: DC
  Administered 2021-04-24 – 2021-04-28 (×5): 75 mg via ORAL
  Filled 2021-04-24 (×6): qty 1

## 2021-04-24 MED ORDER — INSULIN ASPART 100 UNIT/ML IJ SOLN
0.0000 [IU] | Freq: Every day | INTRAMUSCULAR | Status: DC
  Administered 2021-04-27: 4 [IU] via SUBCUTANEOUS

## 2021-04-24 MED ORDER — SODIUM CHLORIDE 0.9 % IV SOLN
250.0000 mL | INTRAVENOUS | Status: DC | PRN
Start: 2021-04-24 — End: 2021-04-27

## 2021-04-24 MED ORDER — EZETIMIBE 10 MG PO TABS
10.0000 mg | ORAL_TABLET | Freq: Every day | ORAL | Status: DC
  Administered 2021-04-25 – 2021-04-29 (×5): 10 mg via ORAL
  Filled 2021-04-24 (×5): qty 1

## 2021-04-24 MED ORDER — STUDY - FASTR - FUROSEMIDE (LASIX) 10 MG/ML IV INFUSION (PI-BENISMHON): 4.0000 mg/h | INTRAVENOUS | Status: DC

## 2021-04-24 MED ORDER — FERROUS SULFATE 325 (65 FE) MG PO TABS
325.0000 mg | ORAL_TABLET | Freq: Two times a day (BID) | ORAL | Status: DC
  Administered 2021-04-25 – 2021-04-29 (×8): 325 mg via ORAL
  Filled 2021-04-24 (×8): qty 1

## 2021-04-24 NOTE — Progress Notes (Signed)
ReDS Vest / Clip - 04/24/21 1200       ReDS Vest / Clip   Station Marker B    Ruler Value 37    ReDS Value Range High volume overload    ReDS Actual Value 48    Anatomical Comments sitting

## 2021-04-24 NOTE — Progress Notes (Signed)
Orthopedic Tech Progress Note Patient Details:  PAULINE PEGUES [birth year] [id number]  Ortho Devices Type of Ortho Device: Haematologist Ortho Device/Splint Location: bi-lateral Ortho Device/Splint Interventions: Ordered, Application, Adjustment  Kyra and I applied unna boots. Post Interventions Patient Tolerated: Well Instructions Provided: Care of device, Adjustment of device  Karolee Stamps 04/24/2021, 9:09 PM

## 2021-04-24 NOTE — Telephone Encounter (Signed)
Pt called stating she needs to r/s her appt on 04/26/21 because she just found out she doing a heart procedure some time this week.  [phone]

## 2021-04-24 NOTE — Progress Notes (Signed)
Advanced Heart Failure Clinic Note   PCP: [name], MD PCP-Cardiologist: [name], MD   Reason for Visit: F/u for Acute on Chronic Diastolic Heart Failure   HPI: Ms Nilan is 60 y.o. female with history of diastolic heart failure, CAD, DES RCA/LAD 2018, DMI, htn, hyperlipidemia, PAD, and OSA on CPAP, PAF s/p ablation, PAD s/p L great toe and 4th toe amputation.  Echo 5/21 EF 60-65% RV ok. Grade II DD.   Zio patch 02/2018 (for possible syncope) with no arrhythmias, only occasional PACs/PVCs.   She is s/p Cardiomems placement (8/19).   Admitted 08/29/20 with chest pain and increased shortness of breath. CTA negative for PE. HS Trop 41>66. Initially thought to be from demand ischemia. Had chest pain again with HS Trop up to  1917. Placed on heparin + Ntg drip and diuresed with IV lasix. Repeat echo (2/22) EF 55-60%. Cath was not pursued with creatinine 3.4. Hospital course complicated by AKI and anemia. SCr down to 2.71 on discharge, weight 253 lbs.   09/29/20 Patient awoke during the night with sharp chest pain and shortness of breath. She presented with ST depressions in V4 and V5, HS troponin that increased from 60>239, and CXR consistent with pulmonary edema.  Additionally she had a leukocytosis on admission which was felt to be reactive in the setting of NSTEMI.   She was started on nitro and heparin drips with improvement in symptoms.  She underwent LHC (3/22) showing 3 vessel CAD with moderate mid-LAD & mid-RCA stenosis and mid-L-Cx stenosis and moderately elevated LVEDP. S/p successful PCI of mid-circ with DES and side branch angioplasty of OM1.   Unfortunately she developed a contrast-induced nephropathy after her cath and her creatinine went from 2.2 up to a peak of 5.5.  She remained volume overloaded at this time.  Additionally, she developed severe nausea and vomiting, gastroparesis and required IV antiplatelet therapy.  While on this therapy she developed epistaxis and  acute blood loss anemia requiring transfusion.  She was given promotility agents given her history of gastroparesis her nausea and vomiting improved.  Epistaxis was treated with nasal Afrin.  She was switched back to oral DAPT. During this time diuretics were reintroduced and her creatinine began to improve over the following days. Her volume status improved significantly, she no longer had bleeding, remained chest pain free and had no further nausea and vomiting.  Her leukocytosis persisted but all her infectious workup was negative and this was felt to be reactive.  She was discharged on 10/06/20.   Post hospital doing well. SCr stable at 1.81. Hgb stable at 8.7. CardioMEMs: Goal dPAP: 10  dPAP: 5 (volume control has been good, persistently below goal). Losing weight and doing CR.  Per chart review, she was admitted 6/22 to [facility] for DKA, started on insulin gtt, IVF and diuretics held. Developed pulmonary vascular congestion vs PNA and was started on IV abx, and diuretics were resumed. She suffered a PEA arrest 01/12/21 with ROSC after 10-15 minutes and was transferred to higher level of care at [facility]; precipitating factor for PEA arrest thought to be respiratory driven. On arrival, she was still in DKA, AKI on CKD with oliguria and Nephrology was consulted for further management. TTE with EF 60%, min MR/TR. Concern for stroke due to left-sided weakness and MRI confirmed anoxic brain injury, but no definite infarct. She was extubated, SCr normalized to baseline of 1.5, and she was discharged to rehab facility.   Zio placed 8/22 for palpitations, showing  mostly SB/SR no afib, 66 SVT runs, frequent isolated SVE (11.2%).  Seen for f/u recently on 04/12/21 and was fluid overloaded. She has had elevated dPAP readings on CardioMEMS recently requiring diuretic dose titration. She also got IV Lasix during last OV 9/28. Despite med titration, she continues w/ fluid overload. Her SCr has also been steadily rising,  from baseline of 1.7>>2.37 on recent BMP 9/22. Subsequently, it has been recommended that she be considered for RHC. She presents to clinic today to further discuss and to reassess volume.   Here w/ husband and daughter today. She submitted CardioMEMS reading earlier today and dPAP was elevated at 16 (goal 10). ReDs Clip also markedly elevated 48%. BP mildly elevated at 137/60. She complains of exertional dyspnea and chest tightness. SOB has increased, now NYHA Class III-IIb. Also complains of 3 pillow orthopnea and LEE. Her wt is up 10 lb since 8/31, from 233>>243 lb today. EKG shows NSR, 73 bpm, nonspecific ST and T wave abnormality. She reports full med compliance and adherence w/ sodium restriction. Only on 20 of torsemide w/ minimal UOP. She is followed by nephrology in [place].     Cardiac Studies: - Echo (5/21): EF 60-65%, RV ok, Grade II DD. - Echo (2/22): EF 55-60%, RV ok, Grade II DD, mod MR - Echo (3/22): EF 55-60%, Grade II DD, normal RV - Echo (6/22): EF 60%, mod MR/TR (per chart review at [facility]). - Zio (8/19): no arrhythmias, occasional PAC/PVCs - Zio (9/22): mostly SB/SR no afib, 66 SVT runs, frequent isolated SVE (11.2%).  - LHC (3/22): 3 vessel CAD with moderate mid-LAD and mid-RCA stenoses, and severe mid-LCx stenosis; successful PCI of the mid-circumflex, reducing severe 90% stenosis to 0% with a 2.75x22 mm Resolute Onyx DES, provisional side branch angioplasty of the OM1 with a 2.5 mm balloon through the stent struts  Review of systems complete and found to be negative unless listed in HPI.    Past Medical History:  Diagnosis Date   Acquired absence of left great toe [facility])    Acquired absence of other left toe(s) (HCC)    Anginal pain ([place])    Atherosclerotic heart disease of native coronary artery without angina pectoris    CAD (coronary artery disease)    08/2016 DES to RCA, also with history of LAD stenting.    Chronic diastolic (congestive) heart failure (HCC)     Chronic kidney disease, stage 2 (mild)    Chronic systolic (congestive) heart failure (HCC)    Contrast dye induced nephropathy, possible 09/03/2020   Diabetes (HCC)    Diastolic CHF (HCC)    Gastro-esophageal reflux disease without esophagitis    Hyperlipidemia    Hypertension    Hypertensive heart disease with heart failure (HCC)    Hypoxemia    Idiopathic gout, multiple sites    Localized edema    Low back pain    Mixed hyperlipidemia    Mixed incontinence    Morbid (severe) obesity due to excess calories (HCC)    Non-ST elevation (NSTEMI) myocardial infarction ([place]) 09/03/2020   Non-ST elevation (NSTEMI) myocardial infarction ([place]) 09/03/2020   OSA (obstructive sleep apnea)    OSA (obstructive sleep apnea) 09/03/2020   Other specified hypothyroidism    Primary insomnia    Type 2 diabetes mellitus with complication, without long-term current use of insulin ([place]) 09/03/2020   Type 2 diabetes mellitus with diabetic nephropathy ([place])     Current Outpatient Medications  Medication Sig Dispense Refill   allopurinol (ZYLOPRIM)  100 MG tablet Take 1 tablet (100 mg total) by mouth daily. 90 tablet 0   aspirin 81 MG chewable tablet Chew 81 mg by mouth daily.     BD PEN NEEDLE NANO 2ND GEN 32G X 4 MM MISC 4 (four) times daily.     carvedilol (COREG) 12.5 MG tablet Take 1 tablet (12.5 mg total) by mouth 2 (two) times daily. 60 tablet 11   Cholecalciferol (VITAMIN D) 50 MCG (2000 UT) tablet Take 2,000 Units by mouth daily. Take one daily     clopidogrel (PLAVIX) 75 MG tablet Take 1 tablet (75 mg total) by mouth at bedtime. 90 tablet 3   Cyanocobalamin 3000 MCG CAPS Take 2 capsules by mouth daily. Patient purchased 3000 mcg gummies and taking 2 gummies daily     EPINEPHrine 0.3 mg/0.3 mL IJ SOAJ injection Use as directed for life-threatening allergic reaction. 4 Device 3   Evolocumab (REPATHA SURECLICK) 161 MG/ML SOAJ Inject 1 Dose into the skin every 14 (fourteen) days. 6 mL 3   ezetimibe  (ZETIA) 10 MG tablet Take 1 tablet (10 mg total) by mouth daily. 90 tablet 2   famotidine (PEPCID) 20 MG tablet Take 1 tablet (20 mg total) by mouth daily. 30 tablet 5   ferrous sulfate 325 (65 FE) MG tablet Take 325 mg by mouth 2 (two) times daily with a meal.     hydrALAZINE (APRESOLINE) 25 MG tablet Take 1 tablet (25 mg total) by mouth 3 (three) times daily. 90 tablet 0   HYDROcodone-acetaminophen (NORCO) 7.5-325 MG tablet Take 1 tablet by mouth 3 (three) times daily as needed for moderate pain. 90 tablet 0   insulin aspart (NOVOLOG) 100 UNIT/ML injection Inject 2-10 Units into the skin 3 (three) times daily before meals.     isosorbide mononitrate (IMDUR) 30 MG 24 hr tablet Take 1 tablet (30 mg total) by mouth daily. 30 tablet 0   lactulose (CHRONULAC) 10 GM/15ML solution Take 15 mLs (10 g total) by mouth daily as needed for mild constipation. 236 mL 0   latanoprost (XALATAN) 0.005 % ophthalmic solution Place 1 drop into both eyes at bedtime.     LEVEMIR FLEXTOUCH 100 UNIT/ML FlexPen Inject 0-50 Units into the skin daily.     LORazepam (ATIVAN) 0.5 MG tablet Take 1 tablet (0.5 mg total) by mouth daily as needed for anxiety. 30 tablet 2   magnesium oxide (MAG-OX) 400 MG tablet Take 2 tablets (800 mg total) by mouth daily. Take 2 (400 mg) daily=800 mg 60 tablet 1   nitroGLYCERIN (NITROSTAT) 0.4 MG SL tablet Place 1 tablet (0.4 mg total) under the tongue every 5 (five) minutes as needed for chest pain. 15 tablet 2   Omega-3 Fatty Acids (FISH OIL PO) Take 1 capsule by mouth daily.     OXYGEN Inhale 2 L into the lungs at bedtime.     pantoprazole (PROTONIX) 40 MG tablet Take 1 tablet (40 mg total) by mouth daily. 30 tablet 3   Probiotic CAPS Take 1 capsule by mouth daily.     promethazine (PHENERGAN) 25 MG tablet Take 1 tablet (25 mg total) by mouth every 6 (six) hours as needed for nausea or vomiting. 90 tablet 1   torsemide (DEMADEX) 20 MG tablet Take 1 tablet (20 mg total) by mouth daily. 30  tablet 3   No current facility-administered medications for this encounter.   Allergies  Allergen Reactions   Naproxen Hives and Rash   Celecoxib Other (See Comments)  Stomach pain   Aspirin Other (See Comments)    CAN NOT TAKE DUE TO ULCERS (documented previously) but takes every day currently (as of 09/29/20) without reported issues   Glucosamine Hives, Swelling and Other (See Comments)    Angioedema    Iron     IV iron transfusion - hives - Feb 2022 hospitalization   Metoclopramide Other (See Comments)    Facial twitching and stuttering   Metolazone Other (See Comments)    Acute renal failure   Shellfish-Derived Products Hives, Swelling and Other (See Comments)    Angioedema    Tramadol Nausea And Vomiting        Social History   Socioeconomic History   Marital status: Married    Spouse name: Not on file   Number of children: Not on file   Years of education: Not on file   Highest education level: Not on file  Occupational History   Not on file  Tobacco Use   Smoking status: Former   Smokeless tobacco: Never  Vaping Use   Vaping Use: Never used  Substance and Sexual Activity   Alcohol use: Not Currently   Drug use: Not Currently   Sexual activity: Not on file  Other Topics Concern   Not on file  Social History Narrative   Not on file   Social Determinants of Health   Financial Resource Strain: Not on file  Food Insecurity: No Food Insecurity   Worried About Running Out of Food in the Last Year: Never true   Ran Out of Food in the Last Year: Never true  Transportation Needs: No Transportation Needs   Lack of Transportation (Medical): No   Lack of Transportation (Non-Medical): No  Physical Activity: Not on file  Stress: Not on file  Social Connections: Not on file  Intimate Partner Violence: Not on file   Family History  Problem Relation Age of Onset   Hypertension Father    Coronary artery disease Mother    Hypertension Mother    Hyperlipidemia  Mother    Diabetes type II Mother    Breast cancer Mother    Lung cancer Mother    Bipolar disorder Other    Depression Other    Schizophrenia Other    BP 137/60   Pulse 76   Wt 110.4 kg (243 lb 6.4 oz)   SpO2 96%   BMI 40.50 kg/m   Wt Readings from Last 3 Encounters:  04/24/21 110.4 kg (243 lb 6.4 oz)  04/12/21 109 kg (240 lb 6.4 oz)  04/04/21 106.6 kg (235 lb)   PHYSICAL EXAM: ReDs Clip 48%  General:  obese WF. No respiratory difficulty at rest HEENT: normal Neck: supple. JVD elevated to Jaw. Carotids 2+ bilat; no bruits. No lymphadenopathy or thyromegaly appreciated. Cor: PMI nondisplaced. Regular rate & rhythm. No rubs, gallops or murmurs. Lungs: decreased BS at the bases bilaterally  Abdomen: obese, soft, nontender, nondistended. No hepatosplenomegaly. No bruits or masses. Good bowel sounds. Extremities: no cyanosis, clubbing, rash, 2-3+ bilateral LE edema Neuro: alert & oriented x 3, cranial nerves grossly intact. moves all 4 extremities w/o difficulty. Affect pleasant.   ECG:  NSR 73 bpm, nonspecific ST and [place] abnormalities (personally reviewed).  CardioMEMs: Goal dPAP: 10  Today's dPAP: 16  ReDs Clip 48%.  ASSESSMENT & PLAN:  1. Acute on Chronic Diastolic Heart Failure - Echo 08/2020 EF 55-60%  - Echo (3/22): EF 55-60%, Grade II DD, normal RV - Echo (6/22): EF 60%, mod MR/TR (  per chart review at [place]). - now in a/c CHF w/ NYHA Class IIIb symptoms and persistent, marked volume overload, despite outpatient diuretic titration, c/b AKI on Stage IV CKD w recent bump in SCr from baseline of 1.7>>2.4. dPAP elevated on CardioMEMs at 16 (goal =10). ReDs clip also elevated at 48%.  - Admit for IV Lasix, give 120 mg x 1 and monitor response. Follow BMP - Screen for FASTR Trial (will need 2C bed) - Continue carvedilol 12.5 mg bid. - Continue hydralazine 25 mg tid + Imdur 30 mg daily. - No SGLT2i with T1DM. - Place [employer]  - Update 2D Echo  - RHC after diuresis    2. CAD - Known CAD with prior stents to mid LAD and pRCA.  - NSTEMI (2/22). Cath deferred due to AKI. Treated medically.  - NSTEMI (7/22)-->LHC and received PCI/DES to mid circ, balloon angioplasty to OM1  - Reports chest tightness but likely 2/2 fluid overload. EKG nonischemic.  Check HS trop - Continue ASA + Plavix for a minimum of 12 months. - Lipids under good control w/ Repatha + Zetia (LDL 66 7/22). Followed by Dr. Debara Pickett.   3. AKI CKD IV  - prior h/o CIN following LHC - Followed by Nephrology in [place] - SCr baseline 1.5 w/ recent bump to 2.4 - BMET on admit and follow daily w/ diuresis   4. OSA - Order CPAP qhs.   5. DM1 - Hgb A1c 6.3 7/22. - continue home insulin regimen   6. Chronic Anemia - Baseline Hgb 8-9 range, likely 2/2 renal disease. - Check CBC on admit   D/w  Dr. [name] who has also seen and examined patient. Plan to admit to [facility] for IV Lasix.   Lyda Jester, PA-C 04/24/21 12:25 PM

## 2021-04-24 NOTE — Research (Addendum)
FASTR Informed Consent   Subject Name: Dana [name]  Subject met inclusion and exclusion criteria.  The informed consent form, study requirements and expectations were reviewed with the subject and questions and concerns were addressed prior to the signing of the consent form.  The subject verbalized understanding of the trial requirements.  The subject agreed to participate in the FASTR trial and signed the informed consent at 13:45 on 24-Apr-2021.  The informed consent was obtained prior to performance of any protocol-specific procedures for the subject.  A copy of the signed informed consent was given to the subject and a copy was placed in the subject's medical record.   Leota Jacobsen, BSN, CVRN-BC   Clinical Cardiovascular Nurse [facility] [place]     ENROLLMENT  Site # 01 Subject # __02____          Visit Date:      24-Apr-2021  Was Informed Consent obtained?    [x]   Yes   []   No        Date of informed consent: __10/OCT/2022__________  DD/MMM/YYYY  Did the Subject meet all eligibility criteria?    [x]   Yes  []   No        If no, specify Criterion not met: __________________        If yes, will the subject be enrolled in the treatment or registry part of the study?                    [x]   Treatment         []   Registry  INCLUSION CRITERIA:     [x]  INCL 1   Hospitalized with a diagnosis of heart failure as defined by the presence of at least 1 symptom (dyspnea, orthopnea, or edema) AND 1 sign (peripheral edema, ascites, jugular venous distension)      [x]  INCL 2  >/= 10 lb (4.5 kg) above dry weight either by historical weights or as estimated by health care provider.      [x]  INCL 3  Prior use of loop diuretics with a total daily dose of 80 mg to 400 mg furosemide equivalents (20 mg torsemide = 1 mg bumetanide = 58m oral furosemide) for a minimum of 30 days prior to admission.   Home dose of diuretic: _20 mg  Torsemide__ (Type, dose)     [x]  INCL 4   Patients >/= 58years of age, able to provide informed consent and comply with study procedures.     EXCLUSION CRITERIA:     []  EXCL 1  Inability to place Foley catheter or IV catheter or other urologic issues that would predispose the patient to a high rate of urogenital trauma or infection with catheter placement.      []  EXCL 2   Hemodynamic instability as defined by any of the following: systolic blood pressure <<40mmHg, use of IV inotropes or vasopressors, mechanical circulatory support, uncontrolled arrhythmias, active severe bleeding, or confirmed or suspected cardiogenic shock.      []  EXCL 3  Dyspnea due primarily to non-cardiac causes (e.g., severe chronic obstructive pulmonary disease or pneumonia).      []  EXCL 4  Acute infection with evidence of systemic involvement (e.g., clinically suspected infection with fever or elevated serum white blood count).     []  EXCL 5   Estimated glomerular filtration rate (eGFR) < 20 ml/min/1.716m calculated using the MDRD equation or current use of  renal replacement therapy,      []  EXCL 6   Significant left ventricular outflow obstruction, uncorrected complex congenital heart disease, known sever stenotic valvular disease, infiltrative or constrictive cardiomyopathy, acute myocarditis, type 1 acute myocardial infarction requiring treatment (within previous week) or any other pathology that, in the opinion of the investigator, would make aggressive diuresis poorly tolerated.      []  EXCL 7  Inability to follow instructions or comply with follow-up procedures.       []  EXCL 8  Other concomitant disease or condition that investigator deems unsuitable for the study, including drug or alcohol abuse or psychiatric, behavioral, or cognitive disorders, sufficient to interfere with the patient's ability to understand and comply with the study instructions or follow-up procedures.     []  EXCL 9   Severe electrolyte  abnormalities (elgl, serum potassium < 3.0 mEq/L, Magnesium < 1.3 mEq/L or sodium < 125 mEq/l).     []  EXCL 10  Presence of active COVID-19 infection     []  EXCL 11  Enrollment in another interventional trial during the index hospitalization    []  EXCL 12  Inability of the patient to stand and obtain daily standing weights.     []  EXCL 13  Inability to return for follow-up study visits.     []  EXCL 14  Life expectancy less that 3 months    []  EXCL 39  Women who are pregnant or intend to become pregnant.     Form is based on Pages 1-3 of FASTR Trial  eCRFs    Protocol RCV-0006         Site # 001       Subject #                      Date: _10-10-2022__  MEDICAL HISTORY:         Medical History Term                    Status   If current, start date             (DD/MMM/YYYY)     Coronary Artery Disease   [x]  Current Condition   []  Past Resolved    []  No Prior history           __XX / _FEB /_2018_        []   Unknown     Myocardial Infarction    []  Current Condition   [x]  Past Resolved    []  No Prior history           __/_    /__        []   Unknown      Acute Coronary Syndrome   []  Current Condition   [x]  Past Resolved    []  No Prior history      _     /_    /_             []   Unknown      Angina   []  Current Condition   [x]  Past Resolved    []  No Prior history      ___/______/_______        []   Unknown      Arrhythmia  Type:PAC/ PVC   []  Current Condition   [x]  Past Resolved    []  No Prior history     ___/______/_______        []   Unknown  CABG   []  Current Condition   []  Past Resolved    [x]  No Prior history     ___/______/_______        []   Unknown      Implantable Cardiac Device    Type: CardioMems    [x]  Current Condition   []  Past Resolved    []  No Prior history     _ 27/__AUG /_2019_        []   Unknown      Cardiac Valvular Heart Disease   Type:   []  Current Condition   []  Past Resolved    [x]  No Prior history       ___/______/_______        []   Unknown     Cardiac Valve Replacement    Type:    []  Current Condition   []  Past Resolved    [x]  No Prior history      ___/______/_______         []   Unknown       HFrEF   []  Current Condition   []  Past Resolved    [x]  No Prior history      ___/______/_______        []   Unknown      HFpEF   [x]  Current Condition   []  Past Resolved    []  No Prior history      19_/_APR /__2022___        []   Unknown      Pericarditis   []  Current Condition   []  Past Resolved    [x]  No Prior history      ___/______/_______        []   Unknown      Pericardial Effusion   []  Current Condition   []  Past Resolved    [x]  No Prior history     ___/______/_______         []   Unknown      Congenital Heart Disease   Type:    []  Current Condition   []  Past Resolved    [x]  No Prior history      ___/______/_______        []   Unknown      Previous Cardiac Surgery;   Type: multiple Heart Catheterizations    []  Current Condition   [x]  Past Resolved    []  No Prior history      ___/______/_______        [x]   Unknown      Carotid Artery Disease   []  Current Condition   []  Past Resolved    [x]  No Prior history      ___/______/_______        []   Unknown      Stroke / TIA    []  Current Condition   []  Past Resolved    [x]  No Prior history      ___/______/_______        []   Unknown      Peripheral Vascular Disese   [x]  Current Condition   []  Past Resolved    []  No Prior history      _UK_/_UNK__/_2016__        [x]   Unknown      Autoimmune disorder     Type:    []  Current Condition   []  Past Resolved    [x]  No Prior history     ___/______/_______        []   Unknown      COVID-19   []   Current Condition   []  Past Resolved    [x]  No Prior history      ___/______/_______        []   Unknown      Asthma   []  Current Condition   []  Past Resolved    [x]  No Prior history      ___/______/_______        []   Unknown       COPD   []  Current Condition   []  Past  Resolved    [x]  No Prior history      ___/______/_______        []   Unknown      Hypercoagulation Disorder   []  Current Condition   []  Past Resolved    [x]  No Prior history      ___/______/_______        []   Unknown       Hyperlipidemia     [x]  Current Condition   []  Past Resolved    []  No Prior history      __UK/ UNK_/_2016__        [x]   Unknown      Hypertension   [x]  Current Condition   []  Past Resolved    []  No Prior history       __UK_/_UNK/__2016__        [x]   Unknown        Hypotension   []  Current Condition   []  Past Resolved    [x]  No Prior history      ___/______/_______        []   Unknown      Hypothyroidism   []  Current Condition   []  Past Resolved    [x]  No Prior history      ___/______/_______        []   Unknown      Diabetes Mellitus      Type: I   [x]  Current Condition   []  Past Resolved    []  No Prior history      _UK/_UNK/__2016_        [x]   Unknown      Liver Disease     Type:    []  Current Condition   []  Past Resolved    [x]  No Prior history      ___/______/_______        []   Unknown      Dialysis      Type:    []  Current Condition   []  Past Resolved    [x]  No Prior history      ___/______/_______        []   Unknown      Previous Kidney surgery     Type:    []  Current Condition   []  Past Resolved    []  No Prior history      ___/______/_______        []   Unknown      Renal disease     Type: CKD Stage 4   [x]  Current Condition   []  Past Resolved    []  No Prior history      __UK_/_UNK /2022_        [x]   Unknown      Other, specify:    []  Current Condition   []  Past Resolved    []  No Prior history      ___/______/_______        []   Unknown     Was Pregnancy Test completed?  []   YES    [x]   NO             IF Yes, Date, Type and Result:   ____/____/_____ ( DD/MMM/YYYY)   []    URINE     []    BLOOD        []   NEGATIVE      []   POSITIVE    IF No, provide reason not done:   []   Female Subject        [x]   Female subject not  of childbearing potential      []   Other, specify: _________________________  Recent Echo (within 6 months) performed?  []   Yes  [x]   No  []   Not Available    If yes:  Date:    ____/____/_____  (DD/MMM/YYYY)       (Last ECHO 09-30-20   55-60%)        LVEF         ______ %       LVEDD ______ cm       LVEDP ______ mmHg  Recent Right Heart Cath; []   Yes  [x]   No   If yes:  Date,   ___/____/_____   (DD/MMM/YYYY)  Pulmonary Artery Pressure (PAP)     ______/______ mmHg          []   Unknown  Pulmonary Capillary Wedge Pressure (PCWP)   ___________ mmHg       []   Unknown  SMOKING HISTORY:  Usage   []   Never  [x]   Former       []   Current Frequency  []  Daily []  Every Week    [x]  Occasional  Start Date   UK_/_UNK /_2000  (DD/MMM/YYYY)       unknown End Date    _UK_/_UNK_/_2001____  (DD/MMM/YYYY)  ALCOHOL USAGE:    Usage   []   Never  [x]   Former       []   Current Frequency  []  Daily []  Every Week    [x]  Occasional  Start Date   _UK_/__UNK/___2017  (DD/MMM/YYYY)     Unknown End Date    _UK /__UNK__/_2017    (DD/MMM/YYYY)  ANY ALLERGIES? [x]   Yes   []   No  If yes, specify allergies: FOOD __Shellfish, ______________ If yes, specify allergies: CONTACT ______________ If yes, specify allergies: MEDICATIONS _ASA, Glucosamine, Iron, Metolazone  Intolerant to Celecoxib, Metoclopramide, Tramadol___  Form is based on Pages 5-10 of FASTR Trial  eCRFs    Protocol RCV-0006

## 2021-04-24 NOTE — Addendum Note (Signed)
Encounter addended by: Kerry Dory, CMA on: 04/24/2021 1:39 PM  Actions taken: Order list changed, Diagnosis association updated

## 2021-04-24 NOTE — H&P (Addendum)
Advanced Heart Failure Team History and Physical Note   PCP:  [name], MD  PCP-Cardiology: [name], MD     Reason for Admission: Acute on Chronic Diastolic CHF    HPI:    Dana [name] is 60 y.o. female with history of diastolic heart failure, CAD, DES RCA/LAD 2018, DMI, htn, hyperlipidemia, PAD, and OSA on CPAP, PAF s/p ablation, PAD s/p L great toe and 4th toe amputation.   Echo 5/21 EF 60-65% RV ok. Grade II DD.   Zio patch 02/2018 (for possible syncope) with no arrhythmias, only occasional PACs/PVCs.   She is s/p Cardiomems placement (8/19).   Admitted 08/29/20 with chest pain and increased shortness of breath. CTA negative for PE. HS Trop 41>66. Initially thought to be from demand ischemia. Had chest pain again with HS Trop up to  1917. Placed on heparin + Ntg drip and diuresed with IV lasix. Repeat echo (2/22) EF 55-60%. Cath was not pursued with creatinine 3.4. Hospital course complicated by AKI and anemia. SCr down to 2.71 on discharge, weight 253 lbs.   09/29/20 Patient awoke during the night with sharp chest pain and shortness of breath. She presented with ST depressions in V4 and V5, HS troponin that increased from 60>239, and CXR consistent with pulmonary edema.  Additionally she had a leukocytosis on admission which was felt to be reactive in the setting of NSTEMI.   She was started on nitro and heparin drips with improvement in symptoms.  She underwent LHC (3/22) showing 3 vessel CAD with moderate mid-LAD & mid-RCA stenosis and mid-L-Cx stenosis and moderately elevated LVEDP. S/p successful PCI of mid-circ with DES and side branch angioplasty of OM1.   Unfortunately she developed a contrast-induced nephropathy after her cath and her creatinine went from 2.2 up to a peak of 5.5.  She remained volume overloaded at this time.  Additionally, she developed severe nausea and vomiting, gastroparesis and required IV antiplatelet therapy.  While on this therapy she developed  epistaxis and acute blood loss anemia requiring transfusion.  She was given promotility agents given her history of gastroparesis her nausea and vomiting improved.  Epistaxis was treated with nasal Afrin.  She was switched back to oral DAPT. During this time diuretics were reintroduced and her creatinine began to improve over the following days. Her volume status improved significantly, she no longer had bleeding, remained chest pain free and had no further nausea and vomiting.  Her leukocytosis persisted but all her infectious workup was negative and this was felt to be reactive.  She was discharged on 10/06/20.    Post hospital doing well. SCr stable at 1.81. Hgb stable at 8.7. CardioMEMs: Goal dPAP: 10  dPAP: 5 (volume control has been good, persistently below goal). Losing weight and doing CR.   Per chart review, she was admitted 6/22 to [facility] for DKA, started on insulin gtt, IVF and diuretics held. Developed pulmonary vascular congestion vs PNA and was started on IV abx, and diuretics were resumed. She suffered a PEA arrest 01/12/21 with ROSC after 10-15 minutes and was transferred to higher level of care at [facility]; precipitating factor for PEA arrest thought to be respiratory driven. On arrival, she was still in DKA, AKI on CKD with oliguria and Nephrology was consulted for further management. TTE with EF 60%, min MR/TR. Concern for stroke due to left-sided weakness and MRI confirmed anoxic brain injury, but no definite infarct. She was extubated, SCr normalized to baseline of 1.5, and she was  discharged to rehab facility.    Zio placed 8/22 for palpitations, showing mostly SB/SR no afib, 66 SVT runs, frequent isolated SVE (11.2%).   Seen for f/u recently on 04/12/21 and was fluid overloaded. She has had elevated dPAP readings on CardioMEMS recently requiring diuretic dose titration. She also got IV Lasix during last OV 9/28. Despite med titration, she continues w/ fluid overload. Her SCr has also been  steadily rising, from baseline of 1.7>>2.37 on recent BMP 9/22. Subsequently, it has been recommended that she be considered for RHC. She presents to clinic today to further discuss and to reassess volume.    Here w/ husband and daughter today. She submitted CardioMEMS reading earlier today and dPAP was elevated at 16 (goal 10). ReDs Clip also markedly elevated 48%. BP mildly elevated at 137/60. She complains of exertional dyspnea and chest tightness. SOB has increased, now NYHA Class III-IIb. Also complains of 3 pillow orthopnea and LEE. Her wt is up 10 lb since 8/31, from 233>>243 lb today. EKG shows NSR, 73 bpm, nonspecific ST and T wave abnormality. She reports full med compliance and adherence w/ sodium restriction. Only on 20 of torsemide w/ minimal UOP. She is followed by nephrology in [place].        Cardiac Studies: - Echo (5/21): EF 60-65%, RV ok, Grade II DD. - Echo (2/22): EF 55-60%, RV ok, Grade II DD, mod MR - Echo (3/22): EF 55-60%, Grade II DD, normal RV - Echo (6/22): EF 60%, mod MR/TR (per chart review at [place]). - Zio (8/19): no arrhythmias, occasional PAC/PVCs - Zio (9/22): mostly SB/SR no afib, 66 SVT runs, frequent isolated SVE (11.2%).   - LHC (3/22): 3 vessel CAD with moderate mid-LAD and mid-RCA stenoses, and severe mid-LCx stenosis; successful PCI of the mid-circumflex, reducing severe 90% stenosis to 0% with a 2.75x22 mm Resolute Onyx DES, provisional side branch angioplasty of the OM1 with a 2.5 mm balloon through the stent struts  Review of Systems: [y] = yes, [ ]  = no   General: Weight gain [Y ]; Weight loss [ ] ; Anorexia [ ] ; Fatigue [ ] ; Fever [ ] ; Chills [ ] ; Weakness [ ]   Cardiac: Chest pain/pressure [ Y]; Resting SOB [ Y]; Exertional SOB Y]; Orthopnea [Y ]; Pedal Edema Y]; Palpitations [ ] ; Syncope [ ] ; Presyncope [ ] ; Paroxysmal nocturnal dyspnea[ ]   Pulmonary: Cough [ ] ; Wheezing[ ] ; Hemoptysis[ ] ; Sputum [ ] ; Snoring [ ]   GI: Vomiting[ ] ; Dysphagia[ ] ; Melena[  ]; Hematochezia [ ] ; Heartburn[ ] ; Abdominal pain [ ] ; Constipation [ ] ; Diarrhea [ ] ; BRBPR [ ]   GU: Hematuria[ ] ; Dysuria [ ] ; Nocturia[ ]   Vascular: Pain in legs with walking [ ] ; Pain in feet with lying flat [ ] ; Non-healing sores [ ] ; Stroke [ ] ; TIA [ ] ; Slurred speech [ ] ;  Neuro: Headaches[ ] ; Vertigo[ ] ; Seizures[ ] ; Paresthesias[ ] ;Blurred vision [ ] ; Diplopia [ ] ; Vision changes [ ]   Ortho/Skin: Arthritis [ ] ; Joint pain [ ] ; Muscle pain [ ] ; Joint swelling [ ] ; Back Pain [ ] ; Rash [ ]   Psych: Depression[ ] ; Anxiety[Y ]  Heme: Bleeding problems [ ] ; Clotting disorders [ ] ; Anemia [Y ]  Endocrine: Diabetes [ Y]; Thyroid dysfunction[ ]    Home Medications Prior to Admission medications   Medication Sig Start Date End Date Taking? Authorizing Provider  allopurinol (ZYLOPRIM) 100 MG tablet Take 1 tablet (100 mg total) by mouth daily. 03/03/21   [name], MD  aspirin  81 MG chewable tablet Chew 81 mg by mouth daily.    [provider]  BD PEN NEEDLE NANO 2ND GEN 32G X 4 MM MISC 4 (four) times daily. 11/06/20   [provider]  carvedilol (COREG) 12.5 MG tablet Take 1 tablet (12.5 mg total) by mouth 2 (two) times daily. 03/15/21   Rafael Bihari, FNP  Cholecalciferol (VITAMIN D) 50 MCG (2000 UT) tablet Take 2,000 Units by mouth daily. Take one daily    [provider]  clopidogrel (PLAVIX) 75 MG tablet Take 1 tablet (75 mg total) by mouth at bedtime. 09/12/20   Rafael Bihari, FNP  Cyanocobalamin 3000 MCG CAPS Take 2 capsules by mouth daily. Patient purchased 3000 mcg gummies and taking 2 gummies daily    [provider]  EPINEPHrine 0.3 mg/0.3 mL IJ SOAJ injection Use as directed for life-threatening allergic reaction. 06/24/18   Kennith Gain, MD  Evolocumab (REPATHA SURECLICK) 161 MG/ML SOAJ Inject 1 Dose into the skin every 14 (fourteen) days. 10/21/20   Hilty, Nadean Corwin, MD  ezetimibe (ZETIA) 10 MG tablet Take 1 tablet (10 mg total)  by mouth daily. 03/28/21   Hilty, Nadean Corwin, MD  famotidine (PEPCID) 20 MG tablet Take 1 tablet (20 mg total) by mouth daily. 06/24/18   Kennith Gain, MD  ferrous sulfate 325 (65 FE) MG tablet Take 325 mg by mouth 2 (two) times daily with a meal.    [provider]  hydrALAZINE (APRESOLINE) 25 MG tablet Take 1 tablet (25 mg total) by mouth 3 (three) times daily. 03/30/21   Yittel Emrich, Shaune Pascal, MD  HYDROcodone-acetaminophen (NORCO) 7.5-325 MG tablet Take 1 tablet by mouth 3 (three) times daily as needed for moderate pain. 03/27/21   Cox, Elnita Maxwell, MD  insulin aspart (NOVOLOG) 100 UNIT/ML injection Inject 2-10 Units into the skin 3 (three) times daily before meals.    [provider]  isosorbide mononitrate (IMDUR) 30 MG 24 hr tablet Take 1 tablet (30 mg total) by mouth daily. 03/28/21   Milford, Maricela Bo, FNP  lactulose (CHRONULAC) 10 GM/15ML solution Take 15 mLs (10 g total) by mouth daily as needed for mild constipation. 02/13/21   Lillard Anes, MD  latanoprost (XALATAN) 0.005 % ophthalmic solution Place 1 drop into both eyes at bedtime.    [provider]  LEVEMIR FLEXTOUCH 100 UNIT/ML FlexPen Inject 0-50 Units into the skin daily. 09/20/20   [provider]  LORazepam (ATIVAN) 0.5 MG tablet Take 1 tablet (0.5 mg total) by mouth daily as needed for anxiety. 03/27/21   Cox, Elnita Maxwell, MD  magnesium oxide (MAG-OX) 400 MG tablet Take 2 tablets (800 mg total) by mouth daily. Take 2 (400 mg) daily=800 mg 04/09/18   Shirley Friar, PA-C  nitroGLYCERIN (NITROSTAT) 0.4 MG SL tablet Place 1 tablet (0.4 mg total) under the tongue every 5 (five) minutes as needed for chest pain. 09/12/20   Milford, Maricela Bo, FNP  Omega-3 Fatty Acids (FISH OIL PO) Take 1 capsule by mouth daily.    [provider]  OXYGEN Inhale 2 L into the lungs at bedtime.    [provider]  pantoprazole (PROTONIX) 40 MG tablet Take 1 tablet (40 mg total) by mouth  daily. 02/08/21   Marge Duncans, PA-C  Probiotic CAPS Take 1 capsule by mouth daily.    [provider]  promethazine (PHENERGAN) 25 MG tablet Take 1 tablet (25 mg total) by mouth every 6 (six) hours as needed  for nausea or vomiting. 08/05/20   Cox, Elnita Maxwell, MD  torsemide (DEMADEX) 20 MG tablet Take 1 tablet (20 mg total) by mouth daily. 03/09/21   Darrick Grinder D, NP    Past Medical History: Past Medical History:  Diagnosis Date   Acquired absence of left great toe [facility])    Acquired absence of other left toe(s) ([place])    Anginal pain ([place])    Atherosclerotic heart disease of native coronary artery without angina pectoris    CAD (coronary artery disease)    08/2016 DES to RCA, also with history of LAD stenting.    Chronic diastolic (congestive) heart failure (HCC)    Chronic kidney disease, stage 2 (mild)    Chronic systolic (congestive) heart failure (HCC)    Contrast dye induced nephropathy, possible 09/03/2020   Diabetes (HCC)    Diastolic CHF (HCC)    Gastro-esophageal reflux disease without esophagitis    Hyperlipidemia    Hypertension    Hypertensive heart disease with heart failure (HCC)    Hypoxemia    Idiopathic gout, multiple sites    Localized edema    Low back pain    Mixed hyperlipidemia    Mixed incontinence    Morbid (severe) obesity due to excess calories (HCC)    Non-ST elevation (NSTEMI) myocardial infarction ([place]) 09/03/2020   Non-ST elevation (NSTEMI) myocardial infarction ([place]) 09/03/2020   OSA (obstructive sleep apnea)    OSA (obstructive sleep apnea) 09/03/2020   Other specified hypothyroidism    Primary insomnia    Type 2 diabetes mellitus with complication, without long-term current use of insulin ([place]) 09/03/2020   Type 2 diabetes mellitus with diabetic nephropathy ([place])     Past Surgical History: Past Surgical History:  Procedure Laterality Date   ABDOMINAL HYSTERECTOMY     Partial- Still has both ovaries   CARDIOVASCULAR STRESS TEST  08/13/2014    Nuclear; Normal   CARPAL TUNNEL RELEASE     CATARACT EXTRACTION, BILATERAL     CESAREAN SECTION     CHOLECYSTECTOMY     CORONARY ANGIOPLASTY WITH STENT PLACEMENT     3 blockages/ 1 stent   CORONARY STENT INTERVENTION N/A 09/29/2020   Procedure: CORONARY STENT INTERVENTION;  Surgeon: Sherren Mocha, MD;  Location: [place] [facility];  Service: Cardiovascular;  Laterality: N/A;  CFX   EYE SURGERY     LEFT HEART CATH AND CORONARY ANGIOGRAPHY N/A 09/29/2020   Procedure: LEFT HEART CATH AND CORONARY ANGIOGRAPHY;  Surgeon: Sherren Mocha, MD;  Location: [place] [facility];  Service: Cardiovascular;  Laterality: N/A;   RIGHT/LEFT HEART CATH AND CORONARY ANGIOGRAPHY N/A 01/07/2017   Procedure: Right/Left Heart Cath and Coronary Angiography;  Surgeon: Larey Dresser, MD;  Location: [place] [facility];  Service: Cardiovascular;  Laterality: N/A;   ROTATOR CUFF REPAIR     TOE AMPUTATION Left    US ECHOCARDIOGRAPHY  05/2017   Normal    Family History:  Family History  Problem Relation Age of Onset   Hypertension Father    Coronary artery disease Mother    Hypertension Mother    Hyperlipidemia Mother    Diabetes type II Mother    Breast cancer Mother    Lung cancer Mother    Bipolar disorder Other    Depression Other    Schizophrenia Other     Social History: Social History   Socioeconomic History   Marital status: Married    Spouse name: Not on file   Number of children: Not  on file   Years of education: Not on file   Highest education level: Not on file  Occupational History   Not on file  Tobacco Use   Smoking status: Former   Smokeless tobacco: Never  Vaping Use   Vaping Use: Never used  Substance and Sexual Activity   Alcohol use: Not Currently   Drug use: Not Currently   Sexual activity: Not on file  Other Topics Concern   Not on file  Social History Narrative   Not on file   Social Determinants of Health   Financial Resource Strain: Not on file  Food  Insecurity: No Food Insecurity   Worried About Running Out of Food in the Last Year: Never true   [place] in the Last Year: Never true  Transportation Needs: No Transportation Needs   Lack of Transportation (Medical): No   Lack of Transportation (Non-Medical): No  Physical Activity: Not on file  Stress: Not on file  Social Connections: Not on file    Allergies:  Allergies  Allergen Reactions   Naproxen Hives and Rash   Celecoxib Other (See Comments)    Stomach pain   Aspirin Other (See Comments)    CAN NOT TAKE DUE TO ULCERS (documented previously) but takes every day currently (as of 09/29/20) without reported issues   Glucosamine Hives, Swelling and Other (See Comments)    Angioedema    Iron     IV iron transfusion - hives - Feb 2022 hospitalization   Metoclopramide Other (See Comments)    Facial twitching and stuttering   Metolazone Other (See Comments)    Acute renal failure   Shellfish-Derived Products Hives, Swelling and Other (See Comments)    Angioedema    Tramadol Nausea And Vomiting         Objective:    Vital Signs:   BP 137/60   Pulse 76   Wt 110.4 kg (243 lb 6.4 oz)   SpO2 96%   BMI 40.50 kg/m   Physical Exam    ReDs Clip 48%  General:  obese WF. No respiratory difficulty at rest HEENT: normal Neck: supple. JVD elevated to Jaw. Carotids 2+ bilat; no bruits. No lymphadenopathy or thyromegaly appreciated. Cor: PMI nondisplaced. Regular rate & rhythm. No rubs, gallops or murmurs. Lungs: decreased BS at the bases bilaterally  Abdomen: obese, soft, nontender, nondistended. No hepatosplenomegaly. No bruits or masses. Good bowel sounds. Extremities: no cyanosis, clubbing, rash, 2-3+ bilateral LE edema Neuro: alert & oriented x 3, cranial nerves grossly intact. moves all 4 extremities w/o difficulty. Affect pleasant.   Telemetry   N/A   EKG   NSR 73 bpm, non specific ST T wave abnormalties. No ST elevations.   Labs     Basic Metabolic  Panel: No results for input(s): NA, K, CL, CO2, GLUCOSE, BUN, CREATININE, CALCIUM, MG, PHOS in the last 168 hours.  Liver Function Tests: No results for input(s): AST, ALT, ALKPHOS, BILITOT, PROT, ALBUMIN in the last 168 hours. No results for input(s): LIPASE, AMYLASE in the last 168 hours. No results for input(s): AMMONIA in the last 168 hours.  CBC: No results for input(s): WBC, NEUTROABS, HGB, HCT, MCV, PLT in the last 168 hours.  Cardiac Enzymes: No results for input(s): CKTOTAL, CKMB, CKMBINDEX, TROPONINI in the last 168 hours.  BNP: BNP (last 3 results) Recent Labs    08/29/20 0636 09/29/20 0724 04/12/21 1130  BNP 87.6 268.7* 353.3*    ProBNP (last 3 results) Recent Labs  10/12/20 1356  PROBNP 3,407*     CBG: No results for input(s): GLUCAP in the last 168 hours.  Coagulation Studies: No results for input(s): LABPROT, INR in the last 72 hours.  Imaging: No results found.   Assessment/Plan   1. Acute on Chronic Diastolic Heart Failure - Echo 08/2020 EF 55-60%  - Echo (3/22): EF 55-60%, Grade II DD, normal RV - Echo (6/22): EF 60%, mod MR/TR (per chart review at [place]). - now in a/c CHF w/ NYHA Class IIIb symptoms and persistent, marked volume overload, despite outpatient diuretic titration, c/b AKI on Stage IV CKD w recent bump in SCr from baseline of 1.7>>2.4. dPAP elevated on CardioMEMs at 16 (goal =10). ReDs clip also elevated at 48%.  - Admit for IV Lasix, give 120 mg x 1 and monitor response. Follow BMP - Screen for FASTR Trial (will need 2C bed) - Continue carvedilol 12.5 mg bid. - Continue hydralazine 25 mg tid + Imdur 30 mg daily. - No SGLT2i with T1DM. - Place [employer]  - Update 2D Echo  - RHC after diuresis    2. CAD - Known CAD with prior stents to mid LAD and pRCA.  - NSTEMI (2/22). Cath deferred due to AKI. Treated medically.  - NSTEMI (7/22)-->LHC and received PCI/DES to mid circ, balloon angioplasty to OM1  - Reports chest  tightness but likely 2/2 fluid overload. EKG nonischemic.  Check HS trop - Continue ASA + Plavix for a minimum of 12 months. - Lipids under good control w/ Repatha + Zetia (LDL 66 7/22). Followed by Dr. Debara Pickett.    3. AKI CKD IV  - prior h/o CIN following LHC - Followed by Nephrology in [place] - SCr baseline 1.5 w/ recent bump to 2.4 - BMET on admit and follow daily w/ diuresis    4. OSA - Order CPAP qhs.    5. DM1 - Hgb A1c 6.3 7/22. - continue home insulin regimen    6. Chronic Anemia - Baseline Hgb 8-9 range, likely 2/2 renal disease. - Check CBC on admit    Lyda Jester, PA-C 04/24/2021, 1:36 PM  Advanced Heart Failure Team Pager [phone] (M-F; 7a - 5p)  Please contact [facility] for night-coverage after hours (4p -7a ) and weekends on [email or web address]    Patient seen and examined with the above-signed Advanced Practice Provider and/or Housestaff. I personally reviewed laboratory data, imaging studies and relevant notes. I independently examined the patient and formulated the important aspects of the plan. I have edited the note to reflect any of my changes or salient points. I have personally discussed the plan with the patient and/or family.   60 y/o with CAD, CKD IV and chronic diastolic HF.   Has been struggling with severe volume overload refractory to intensification of outpatient management. Course also c/b cardiorenal syndrome  General:  Fatigued appearing. No resp difficulty HEENT: normal Neck: supple. JVP to jaw . Carotids 2+ bilat; no bruits. No lymphadenopathy or thryomegaly appreciated. Cor: PMI nondisplaced. Regular rate & rhythm. No rubs, gallops or murmurs. Lungs: clear Abdomen: obese soft, nontender, + distended. No hepatosplenomegaly. No bruits or masses. Good bowel sounds. Extremities: no cyanosis, clubbing, rash, 3+ edema Neuro: alert & orientedx3, cranial nerves grossly intact. moves all 4 extremities w/o difficulty. Affect pleasant  She is failing  intensive outpatient management of her HF. Will admit for IV diuresis and possible RHC. Will consider enrollment into FASTR trial. No s/s coronary ischemia currently.   [name], MD  3:52 PM

## 2021-04-25 ENCOUNTER — Other Ambulatory Visit: Payer: Self-pay | Admitting: *Deleted

## 2021-04-25 ENCOUNTER — Inpatient Hospital Stay (HOSPITAL_COMMUNITY): Payer: HMO

## 2021-04-25 ENCOUNTER — Encounter (HOSPITAL_COMMUNITY): Payer: Self-pay | Admitting: Internal Medicine

## 2021-04-25 DIAGNOSIS — N179 Acute kidney failure, unspecified: Secondary | ICD-10-CM

## 2021-04-25 DIAGNOSIS — I5033 Acute on chronic diastolic (congestive) heart failure: Secondary | ICD-10-CM

## 2021-04-25 DIAGNOSIS — D649 Anemia, unspecified: Secondary | ICD-10-CM

## 2021-04-25 DIAGNOSIS — N184 Chronic kidney disease, stage 4 (severe): Secondary | ICD-10-CM

## 2021-04-25 DIAGNOSIS — I2510 Atherosclerotic heart disease of native coronary artery without angina pectoris: Secondary | ICD-10-CM | POA: Diagnosis not present

## 2021-04-25 DIAGNOSIS — I5031 Acute diastolic (congestive) heart failure: Secondary | ICD-10-CM | POA: Diagnosis not present

## 2021-04-25 LAB — COMPREHENSIVE METABOLIC PANEL
ALT: 15 U/L (ref 0–44)
ALT: 14 U/L (ref 0–44)
AST: 17 U/L (ref 15–41)
AST: 15 U/L (ref 15–41)
Albumin: 3.2 g/dL — ABNORMAL LOW (ref 3.5–5.0)
Albumin: 3.1 g/dL — ABNORMAL LOW (ref 3.5–5.0)
Alkaline Phosphatase: 96 U/L (ref 38–126)
Alkaline Phosphatase: 99 U/L (ref 38–126)
Anion gap: 11 (ref 5–15)
Anion gap: 12 (ref 5–15)
BUN: 52 mg/dL — ABNORMAL HIGH (ref 6–20)
BUN: 57 mg/dL — ABNORMAL HIGH (ref 6–20)
CO2: 24 mmol/L (ref 22–32)
CO2: 25 mmol/L (ref 22–32)
Calcium: 8.5 mg/dL — ABNORMAL LOW (ref 8.9–10.3)
Calcium: 8.8 mg/dL — ABNORMAL LOW (ref 8.9–10.3)
Chloride: 104 mmol/L (ref 98–111)
Chloride: 102 mmol/L (ref 98–111)
Creatinine, Ser: 2.23 mg/dL — ABNORMAL HIGH (ref 0.44–1.00)
Creatinine, Ser: 2.35 mg/dL — ABNORMAL HIGH (ref 0.44–1.00)
GFR, Estimated: 25 mL/min — ABNORMAL LOW (ref >60)
GFR, Estimated: 23 mL/min — ABNORMAL LOW (ref >60)
Glucose, Bld: 122 mg/dL — ABNORMAL HIGH (ref 70–99)
Glucose, Bld: 153 mg/dL — ABNORMAL HIGH (ref 70–99)
Potassium: 4.1 mmol/L (ref 3.5–5.1)
Potassium: 3.9 mmol/L (ref 3.5–5.1)
Sodium: 139 mmol/L (ref 135–145)
Sodium: 139 mmol/L (ref 135–145)
Total Bilirubin: 1.2 mg/dL (ref 0.3–1.2)
Total Bilirubin: 0.8 mg/dL (ref 0.3–1.2)
Total Protein: 6.9 g/dL (ref 6.5–8.1)
Total Protein: 6.9 g/dL (ref 6.5–8.1)

## 2021-04-25 LAB — GLUCOSE, CAPILLARY
Glucose-Capillary: 101 mg/dL — ABNORMAL HIGH (ref 70–99)
Glucose-Capillary: 109 mg/dL — ABNORMAL HIGH (ref 70–99)
Glucose-Capillary: 144 mg/dL — ABNORMAL HIGH (ref 70–99)
Glucose-Capillary: 90 mg/dL (ref 70–99)

## 2021-04-25 LAB — BASIC METABOLIC PANEL
Anion gap: 7 (ref 5–15)
Anion gap: 10 (ref 5–15)
BUN: 52 mg/dL — ABNORMAL HIGH (ref 6–20)
BUN: 53 mg/dL — ABNORMAL HIGH (ref 6–20)
CO2: 27 mmol/L (ref 22–32)
CO2: 28 mmol/L (ref 22–32)
Calcium: 8.4 mg/dL — ABNORMAL LOW (ref 8.9–10.3)
Calcium: 8.8 mg/dL — ABNORMAL LOW (ref 8.9–10.3)
Chloride: 105 mmol/L (ref 98–111)
Chloride: 101 mmol/L (ref 98–111)
Creatinine, Ser: 2.27 mg/dL — ABNORMAL HIGH (ref 0.44–1.00)
Creatinine, Ser: 2.3 mg/dL — ABNORMAL HIGH (ref 0.44–1.00)
GFR, Estimated: 24 mL/min — ABNORMAL LOW (ref >60)
GFR, Estimated: 24 mL/min — ABNORMAL LOW (ref >60)
Glucose, Bld: 96 mg/dL (ref 70–99)
Glucose, Bld: 116 mg/dL — ABNORMAL HIGH (ref 70–99)
Potassium: 4.4 mmol/L (ref 3.5–5.1)
Potassium: 4.4 mmol/L (ref 3.5–5.1)
Sodium: 139 mmol/L (ref 135–145)
Sodium: 139 mmol/L (ref 135–145)

## 2021-04-25 LAB — ECHOCARDIOGRAM COMPLETE
Area-P 1/2: 4.49 cm2
Calc EF: 69.1 %
Height: 65 in
MV M vel: 4.49 m/s
MV Peak grad: 80.6 mmHg
Radius: 0.4 cm
S' Lateral: 3.2 cm
Single Plane A2C EF: 66.3 %
Single Plane A4C EF: 72 %
Weight: 3760.17 oz

## 2021-04-25 LAB — URIC ACID: Uric Acid, Serum: 9.3 mg/dL — ABNORMAL HIGH (ref 2.5–7.1)

## 2021-04-25 LAB — MAGNESIUM
Magnesium: 2 mg/dL (ref 1.7–2.4)
Magnesium: 2 mg/dL (ref 1.7–2.4)

## 2021-04-25 LAB — HEMOGLOBIN A1C
Hgb A1c MFr Bld: 6.6 % — ABNORMAL HIGH (ref 4.8–5.6)
Mean Plasma Glucose: 142.72 mg/dL

## 2021-04-25 MED ORDER — SODIUM CHLORIDE 0.9 % IV SOLN
12.5000 mg | Freq: Four times a day (QID) | INTRAVENOUS | Status: DC | PRN
  Administered 2021-04-25 – 2021-04-26 (×2): 12.5 mg via INTRAVENOUS
  Filled 2021-04-25 (×3): qty 0.5

## 2021-04-25 MED ORDER — PERFLUTREN LIPID MICROSPHERE
1.0000 mL | INTRAVENOUS | Status: AC | PRN
  Administered 2021-04-25: 2 mL via INTRAVENOUS
  Filled 2021-04-25: qty 10

## 2021-04-25 MED ORDER — INSULIN DETEMIR 100 UNIT/ML ~~LOC~~ SOLN
30.0000 [IU] | Freq: Every day | SUBCUTANEOUS | Status: DC
  Administered 2021-04-25: 30 [IU] via SUBCUTANEOUS
  Filled 2021-04-25 (×2): qty 0.3

## 2021-04-25 MED ORDER — METOLAZONE 2.5 MG PO TABS
2.5000 mg | ORAL_TABLET | Freq: Once | ORAL | Status: AC
  Administered 2021-04-25: 2.5 mg via ORAL
  Filled 2021-04-25: qty 1

## 2021-04-25 MED ORDER — CHLORHEXIDINE GLUCONATE CLOTH 2 % EX PADS
6.0000 | MEDICATED_PAD | Freq: Every day | CUTANEOUS | Status: DC
  Administered 2021-04-25: 6 via TOPICAL

## 2021-04-25 MED ORDER — PREDNISONE 20 MG PO TABS
40.0000 mg | ORAL_TABLET | Freq: Every day | ORAL | Status: AC
  Administered 2021-04-26 – 2021-04-27 (×2): 40 mg via ORAL
  Filled 2021-04-25 (×2): qty 2

## 2021-04-25 NOTE — Progress Notes (Signed)
     Targeted Physical Exam    Exam Performed? [x] Yes  []  No  Date/Time of Assessment 04-25-21 09:30  Time unknown        Height 65 in   [] Not done  Estimated pounds over dry weight 10 lb   [] Not done  Estimated excess fluid volume 3 L [] Not done   Is subject experiencing Shortness of Breath? [] Yes [x] No [] Not done  Dyspnea VAS: (10 being the worst) 1       Is subject experiencing Orthopnea? [] Yes [x] No [] Not done  Is there evidence of Rales? [] Yes [x] No [] Not done  []  Rhonchi []  Crackles []  Wheezing []  Stridor      Is there presence of ascites? [] Yes [x] No [] Not done  Is there leg Edema? [] Yes [] No [] Not done  Grade of edema  2+       Is there sacral Edema? [] Yes [x] No [] Not done  Grade of edema         Is there Jugular Vein Distention (JVD)? [x] Yes [] No [] Not done       Provide measurement 10 cm  NYHA Classification: NYHA III    Has subject expressed presence of thirst?  [] Yes [x] No [] Not done       Rate of thirst level (10 being the worst):    Saim Almanza NP-C 10:31 AM

## 2021-04-25 NOTE — Progress Notes (Signed)
Heart Failure Navigator Progress Note  Assessed for Heart & Vascular TOC clinic readiness.  Patient does not meet criteria due to previously established AHF clinic patient. AHF rounding team is attending.   Navigator available for reassessment of patient.   Pricilla Holm, MSN, RN Heart Failure Nurse Navigator [phone]

## 2021-04-25 NOTE — Telephone Encounter (Signed)
Appointment cancelled. Patient will call back to reschedule.

## 2021-04-25 NOTE — Progress Notes (Addendum)
Advanced Heart Failure Rounding Note  PCP-Cardiologist: [name], MD   Subjective:    Admitted with volume overload. Placed on FASTR trial. Negative 2.1 liters.    Denies SOB. Denies chest pain.  Objective:   Weight Range: 106.6 kg Body mass index is 39.11 kg/m.   Vital Signs:   Temp:  [97.8 F (36.6 C)-98.2 F (36.8 C)] 98.1 F (36.7 C) (10/11 0408) Pulse Rate:  [73-80] 75 (10/11 0408) Resp:  [12-16] 16 (10/11 0408) BP: (135-153)/(45-74) 135/45 (10/11 0408) SpO2:  [90 %-99 %] 98 % (10/11 0408) Weight:  [106.6 kg-107.9 kg] 106.6 kg (10/11 0408)    Weight change: Filed Weights   04/24/21 1836 04/25/21 0408  Weight: 107.9 kg 106.6 kg    Intake/Output:   Intake/Output Summary (Last 24 hours) at 04/25/2021 0831 Last data filed at 04/25/2021 0821 Gross per 24 hour  Intake 924 ml  Output 2864 ml  Net -1940 ml      Physical Exam    General: In bed . No resp difficulty HEENT: Normal Neck: Supple. JVP 10 Carotids 2+ bilat; no bruits. No lymphadenopathy or thyromegaly appreciated. Cor: PMI nondisplaced. Regular rate & rhythm. No rubs, gallops or murmurs. Lungs: Clear Abdomen: obese, soft, nontender, nondistended. No hepatosplenomegaly. No bruits or masses. Good bowel sounds. Extremities: No cyanosis, clubbing, rash, R and LLE unna boots. 2+ lower extremity edema Neuro: Alert & orientedx3, cranial nerves grossly intact. moves all 4 extremities w/o difficulty. Affect pleasant GU: Foley clear yellow urine.    Telemetry   SR 70-80s   EKG    N/A  Labs    CBC Recent Labs    04/24/21 1250 04/24/21 2015  WBC 11.7* 11.1*  HGB 8.5* 8.4*  HCT 28.6* 28.3*  MCV 90.2 90.1  PLT 302 732   Basic Metabolic Panel Recent Labs    04/24/21 1250 04/24/21 1910 04/24/21 2015 04/25/21 0020  NA 138  --  139 139  K 4.5  --  4.2 4.4  CL 104  --  104 105  CO2 25  --  23 27  GLUCOSE 67*  --  122* 96  BUN 51*  --  53* 52*  CREATININE 2.36*   < > 2.29*  2.27*  CALCIUM 8.5*  --  8.5* 8.4*  MG 2.4  --  2.3  --    < > = values in this interval not displayed.   Liver Function Tests Recent Labs    04/24/21 1250 04/24/21 2015  AST 17 15  ALT 15 15  ALKPHOS 96 86  BILITOT 0.9 0.8  PROT 7.3 7.1  ALBUMIN 3.4* 3.3*   No results for input(s): LIPASE, AMYLASE in the last 72 hours. Cardiac Enzymes No results for input(s): CKTOTAL, CKMB, CKMBINDEX, TROPONINI in the last 72 hours.  BNP: BNP (last 3 results) Recent Labs    08/29/20 0636 09/29/20 0724 04/12/21 1130  BNP 87.6 268.7* 353.3*    ProBNP (last 3 results) Recent Labs    10/12/20 1356  PROBNP 3,407*     D-Dimer No results for input(s): DDIMER in the last 72 hours. Hemoglobin A1C Recent Labs    04/25/21 0020  HGBA1C 6.6*   Fasting Lipid Panel No results for input(s): CHOL, HDL, LDLCALC, TRIG, CHOLHDL, LDLDIRECT in the last 72 hours. Thyroid Function Tests Recent Labs    04/24/21 1250  TSH 4.014    Other results:   Imaging    No results found.   Medications:  Scheduled Medications:  allopurinol  100 mg Oral Daily   aspirin EC  81 mg Oral Daily   carvedilol  12.5 mg Oral BID WC   clopidogrel  75 mg Oral QHS   enoxaparin (LOVENOX) injection  40 mg Subcutaneous Q24H   ezetimibe  10 mg Oral Daily   ferrous sulfate  325 mg Oral BID WC   hydrALAZINE  25 mg Oral TID   insulin aspart  0-15 Units Subcutaneous TID WC   insulin aspart  0-5 Units Subcutaneous QHS   insulin detemir  50 Units Subcutaneous Daily   isosorbide mononitrate  30 mg Oral Daily   pantoprazole  40 mg Oral Daily   sodium chloride flush  3 mL Intravenous Q12H    Infusions:  sodium chloride     FASTR furosemide 40 mg/hr (04/25/21 0427)    PRN Medications: sodium chloride, acetaminophen, HYDROcodone-acetaminophen, LORazepam, nitroGLYCERIN, sodium chloride flush    Patient Profile   Dana [name] is 60 y.o. female with history of diastolic heart failure, CAD, DES RCA/LAD  2018, DMI, htn, hyperlipidemia, PAD, and OSA on CPAP, PAF s/p ablation, PAD s/p L great toe and 4th toe amputation.  Admitted with volume overload. Placed on FASTR trial.   Assessment/Plan   1. Acute on Chronic Diastolic Heart Failure - Echo 08/2020 EF 55-60%  - Echo (3/22): EF 55-60%, Grade II DD, normal RV - Echo (6/22): EF 60%, mod MR/TR (per chart review at Echo). - now in a/c CHF w/ NYHA Class IIIb symptoms and persistent, marked volume overload, despite outpatient diuretic titration, c/b AKI on Stage IV CKD w recent bump in SCr from baseline of 1.7>>2.4.  CardioMEMs at 16 (goal =10). ReDs clip also elevated at 48% on admit. Reds Clip 47% today.  - 10/10 given 120 mg x 1 and monitor response. Started on FASTR Trial.  -  Continue carvedilol 12.5 mg bid. - Continue hydralazine 25 mg tid + Imdur 30 mg daily. - No SGLT2i with T1DM. - Continue unna boots.    2. CAD - Known CAD with prior stents to mid LAD and pRCA.  - NSTEMI (2/22). Cath deferred due to AKI. Treated medically.  - NSTEMI (7/22)-->LHC and received PCI/DES to mid circ, balloon angioplasty to OM1  - Reports chest tightness but likely 2/2 fluid overload. EKG nonischemic.  HS Trop 55>58  - Continue ASA + Plavix for a minimum of 12 months. - Lipids under good control w/ Repatha + Zetia (LDL 66 7/22). Followed by Dr. Debara Pickett.    3. AKI CKD IV  - prior h/o CIN following LHC - Followed by Nephrology in [place] - SCr baseline 1.5 w/ recent bump to 2.4 - Creatinine unchanged at 2.3    4. OSA - Order CPAP qhs.    5. DM1 - Hgb A1c 6.3 7/22. - continue home insulin regimen    6. Chronic Anemia - Baseline Hgb 8-9 range, likely 2/2 renal disease. - hgb on admit 8.4  - Check anemia panel.     Length of Stay: 1  Amy Clegg, NP  04/25/2021, 8:31 AM  Advanced Heart Failure Team Pager [phone] (M-F; 7a - 5p)  Please contact [facility] for night-coverage after hours (5p -7a ) and weekends on [email or web address]  Patient seen and  examined with the above-signed Advanced Practice Provider and/or Housestaff. I personally reviewed laboratory data, imaging studies and relevant notes. I independently examined the patient and formulated the important aspects of the plan. I have edited the note to reflect  any of my changes or salient points. I have personally discussed the plan with the patient and/or family.  She remains on IV lasix via Reprieve device. Good urine output overnight but now seems to be slowing. Weight down almost 3 pounds. Breathing ok. Denies orthopnea or PND. R wrist sore  General:  Sitting up in bed  No resp difficulty HEENT: normal Neck: supple. JVP 10. Carotids 2+ bilat; no bruits. No lymphadenopathy or thryomegaly appreciated. Cor: PMI nondisplaced. Regular rate & rhythm. No rubs, gallops or murmurs. Lungs: clear Abdomen: soft, nontender, nondistended. No hepatosplenomegaly. No bruits or masses. Good bowel sounds. Extremities: no cyanosis, clubbing, rash, 1+ edema + UNNA Neuro: alert & orientedx3, cranial nerves grossly intact. moves all 4 extremities w/o difficulty. Affect pleasant  Continue IV lasix via Reprieve. Creatinine stable. Will give metolazone 2.5 x 1. May still need RHC to further evaluate volume status. Suspect gout in R wrist. Check uric acid.  [name], MD  3:45 PM

## 2021-04-25 NOTE — Progress Notes (Signed)
Notified Amy with HF team of low urine output alarming on reprieve machine. Metolazone ordered and given. Will continue to monitor.

## 2021-04-25 NOTE — Progress Notes (Signed)
Left AD with patient.  Pt. Will notify Chaplain if further assistance is needed.

## 2021-04-25 NOTE — Progress Notes (Signed)
   Uric Acid 9.3. Start prednisone 40 mg daily x 3 days.   Amos Micheals NP-C  11:35 AM

## 2021-04-25 NOTE — Progress Notes (Addendum)
Inpatient Diabetes Program Recommendations  AACE/ADA: New Consensus Statement on Inpatient Glycemic Control   Target Ranges:  Prepandial:   less than 140 mg/dL      Peak postprandial:   less than 180 mg/dL (1-2 hours)      Critically ill patients:  140 - 180 mg/dL   Results for LUMMIE, MONTIJO (MRN [id number]) as of 04/25/2021 12:25  Ref. Range 04/24/2021 21:22 04/25/2021 00:11 04/25/2021 06:15 04/25/2021 11:19  Glucose-Capillary Latest Ref Range: 70 - 99 mg/dL 125 (H) 90 101 (H) 109 (H)  Results for JOYICE, MAGDA (MRN [id number]) as of 04/25/2021 12:25  Ref. Range 09/30/2020 02:41 04/25/2021 00:20  Hemoglobin A1C Latest Ref Range: 4.8 - 5.6 % 6.8 (H) 6.6 (H)    Review of Glycemic Control  Diabetes history: DM Outpatient Diabetes medications: Levemir 50 units daily, Novolog 2-10 units TID with meals  Current orders for Inpatient glycemic control: Novolog 0-15 units TID with meals, Novolog 0-5 units QHS; Prednisone 40 mg QAM  Inpatient Diabetes Program Recommendations:    Insulin: Patient reports she took Levemir 50 units yesterday morning at home. Please consider ordering Levemir 40 units Q24H.  NOTE: Spoke with patient over the phone to confirm outpatient DM medication regimen. Patient states that she is taking Levemir 50 units QAM and Novolog 2-10 units TID with meals. Patient reports that she uses FreeStyle Libre2 for glucose monitoring. Patient reports that she has been having some hypoglycemia lately and she notes that she is not able to tell (she has no symptoms of hypoglycemia). Encouraged patient to talk with her Endocrinologist Dr. Tamala Julian about hypoglycemia as she may need adjustments with insulin. Patient reports that she last took Levemir 50 units yesterday morning at home. Discussed that currently she has no basal insulin ordered but it would be requested that attending provider order basal insulin, Levemir 40 units Q24H. Patient verbalized understanding of information and states  she has no questions at this time.  Thanks, Barnie Alderman, RN, MSN, CDE Diabetes Coordinator Inpatient Diabetes Program [phone] (Team Pager from 8am to 5pm)  Thanks, Barnie Alderman, RN, MSN, CDE Diabetes Coordinator Inpatient Diabetes Program [phone] (Team Pager from 8am to 5pm)

## 2021-04-26 ENCOUNTER — Encounter: Payer: Self-pay | Admitting: *Deleted

## 2021-04-26 ENCOUNTER — Encounter: Payer: HMO | Admitting: Physical Medicine and Rehabilitation

## 2021-04-26 ENCOUNTER — Other Ambulatory Visit: Payer: Self-pay

## 2021-04-26 DIAGNOSIS — I5033 Acute on chronic diastolic (congestive) heart failure: Secondary | ICD-10-CM | POA: Diagnosis not present

## 2021-04-26 DIAGNOSIS — Z006 Encounter for examination for normal comparison and control in clinical research program: Secondary | ICD-10-CM

## 2021-04-26 LAB — IRON AND TIBC
Iron: 45 ug/dL (ref 28–170)
Saturation Ratios: 13 % (ref 10.4–31.8)
TIBC: 340 ug/dL (ref 250–450)
UIBC: 295 ug/dL

## 2021-04-26 LAB — COMPREHENSIVE METABOLIC PANEL
ALT: 14 U/L (ref 0–44)
ALT: 14 U/L (ref 0–44)
AST: 16 U/L (ref 15–41)
AST: 16 U/L (ref 15–41)
Albumin: 3.4 g/dL — ABNORMAL LOW (ref 3.5–5.0)
Albumin: 3.5 g/dL (ref 3.5–5.0)
Alkaline Phosphatase: 102 U/L (ref 38–126)
Alkaline Phosphatase: 100 U/L (ref 38–126)
Anion gap: 11 (ref 5–15)
Anion gap: 12 (ref 5–15)
BUN: 59 mg/dL — ABNORMAL HIGH (ref 6–20)
BUN: 76 mg/dL — ABNORMAL HIGH (ref 6–20)
CO2: 29 mmol/L (ref 22–32)
CO2: 25 mmol/L (ref 22–32)
Calcium: 9 mg/dL (ref 8.9–10.3)
Calcium: 9 mg/dL (ref 8.9–10.3)
Chloride: 98 mmol/L (ref 98–111)
Chloride: 94 mmol/L — ABNORMAL LOW (ref 98–111)
Creatinine, Ser: 2.36 mg/dL — ABNORMAL HIGH (ref 0.44–1.00)
Creatinine, Ser: 2.8 mg/dL — ABNORMAL HIGH (ref 0.44–1.00)
GFR, Estimated: 23 mL/min — ABNORMAL LOW (ref >60)
GFR, Estimated: 19 mL/min — ABNORMAL LOW (ref >60)
Glucose, Bld: 191 mg/dL — ABNORMAL HIGH (ref 70–99)
Glucose, Bld: 488 mg/dL — ABNORMAL HIGH (ref 70–99)
Potassium: 4.3 mmol/L (ref 3.5–5.1)
Potassium: 4.5 mmol/L (ref 3.5–5.1)
Sodium: 138 mmol/L (ref 135–145)
Sodium: 131 mmol/L — ABNORMAL LOW (ref 135–145)
Total Bilirubin: 1.2 mg/dL (ref 0.3–1.2)
Total Bilirubin: 1.1 mg/dL (ref 0.3–1.2)
Total Protein: 7.7 g/dL (ref 6.5–8.1)
Total Protein: 7.8 g/dL (ref 6.5–8.1)

## 2021-04-26 LAB — CBC
HCT: 26.7 % — ABNORMAL LOW (ref 36.0–46.0)
HCT: 29.8 % — ABNORMAL LOW (ref 36.0–46.0)
Hemoglobin: 8.2 g/dL — ABNORMAL LOW (ref 12.0–15.0)
Hemoglobin: 9.5 g/dL — ABNORMAL LOW (ref 12.0–15.0)
MCH: 26.8 pg (ref 26.0–34.0)
MCH: 27.5 pg (ref 26.0–34.0)
MCHC: 30.7 g/dL (ref 30.0–36.0)
MCHC: 31.9 g/dL (ref 30.0–36.0)
MCV: 87.3 fL (ref 80.0–100.0)
MCV: 86.1 fL (ref 80.0–100.0)
Platelets: 283 10*3/uL (ref 150–400)
Platelets: 340 10*3/uL (ref 150–400)
RBC: 3.06 MIL/uL — ABNORMAL LOW (ref 3.87–5.11)
RBC: 3.46 MIL/uL — ABNORMAL LOW (ref 3.87–5.11)
RDW: 15.6 % — ABNORMAL HIGH (ref 11.5–15.5)
RDW: 15.2 % (ref 11.5–15.5)
WBC: 10.5 10*3/uL (ref 4.0–10.5)
WBC: 12.8 10*3/uL — ABNORMAL HIGH (ref 4.0–10.5)
nRBC: 0 % (ref 0.0–0.2)
nRBC: 0 % (ref 0.0–0.2)

## 2021-04-26 LAB — GLUCOSE, CAPILLARY
Glucose-Capillary: 74 mg/dL (ref 70–99)
Glucose-Capillary: 224 mg/dL — ABNORMAL HIGH (ref 70–99)
Glucose-Capillary: 508 mg/dL (ref 70–99)
Glucose-Capillary: 467 mg/dL — ABNORMAL HIGH (ref 70–99)

## 2021-04-26 LAB — TROPONIN I (HIGH SENSITIVITY)
Troponin I (High Sensitivity): 66 ng/L — ABNORMAL HIGH (ref <18)
Troponin I (High Sensitivity): 62 ng/L — ABNORMAL HIGH (ref <18)

## 2021-04-26 LAB — MAGNESIUM
Magnesium: 2.1 mg/dL (ref 1.7–2.4)
Magnesium: 2 mg/dL (ref 1.7–2.4)

## 2021-04-26 LAB — BASIC METABOLIC PANEL
Anion gap: 10 (ref 5–15)
BUN: 57 mg/dL — ABNORMAL HIGH (ref 6–20)
CO2: 27 mmol/L (ref 22–32)
Calcium: 9 mg/dL (ref 8.9–10.3)
Chloride: 102 mmol/L (ref 98–111)
Creatinine, Ser: 2.42 mg/dL — ABNORMAL HIGH (ref 0.44–1.00)
GFR, Estimated: 23 mL/min — ABNORMAL LOW (ref >60)
Glucose, Bld: 164 mg/dL — ABNORMAL HIGH (ref 70–99)
Potassium: 4.4 mmol/L (ref 3.5–5.1)
Sodium: 139 mmol/L (ref 135–145)

## 2021-04-26 LAB — RETICULOCYTES
Immature Retic Fract: 15.2 % (ref 2.3–15.9)
RBC.: 3.21 MIL/uL — ABNORMAL LOW (ref 3.87–5.11)
Retic Count, Absolute: 78.3 10*3/uL (ref 19.0–186.0)
Retic Ct Pct: 2.4 % (ref 0.4–3.1)

## 2021-04-26 LAB — FOLATE: Folate: 18.5 ng/mL (ref >5.9)

## 2021-04-26 LAB — FERRITIN: Ferritin: 76 ng/mL (ref 11–307)

## 2021-04-26 LAB — VITAMIN B12: Vitamin B-12: 1312 pg/mL — ABNORMAL HIGH (ref 180–914)

## 2021-04-26 MED ORDER — ISOSORBIDE MONONITRATE ER 60 MG PO TB24
60.0000 mg | ORAL_TABLET | Freq: Every day | ORAL | Status: DC
  Administered 2021-04-27 – 2021-04-29 (×3): 60 mg via ORAL
  Filled 2021-04-26 (×3): qty 1

## 2021-04-26 MED ORDER — SODIUM CHLORIDE 0.9 % IV SOLN: 250.0000 mL | INTRAVENOUS | Status: DC | PRN

## 2021-04-26 MED ORDER — SODIUM CHLORIDE 0.9% FLUSH: 3.0000 mL | INTRAVENOUS | Status: DC | PRN

## 2021-04-26 MED ORDER — INSULIN ASPART 100 UNIT/ML IJ SOLN
10.0000 [IU] | Freq: Once | INTRAMUSCULAR | Status: AC
  Administered 2021-04-26: 10 [IU] via SUBCUTANEOUS

## 2021-04-26 MED ORDER — SODIUM CHLORIDE 0.9% FLUSH
3.0000 mL | Freq: Two times a day (BID) | INTRAVENOUS | Status: DC
  Administered 2021-04-26 – 2021-04-27 (×2): 3 mL via INTRAVENOUS

## 2021-04-26 MED ORDER — INSULIN DETEMIR 100 UNIT/ML ~~LOC~~ SOLN
35.0000 [IU] | Freq: Every day | SUBCUTANEOUS | Status: DC
  Administered 2021-04-26 – 2021-04-27 (×2): 35 [IU] via SUBCUTANEOUS
  Filled 2021-04-26 (×3): qty 0.35

## 2021-04-26 MED ORDER — SODIUM CHLORIDE 0.9 % IV SOLN
INTRAVENOUS | Status: DC
Start: 2021-04-27 — End: 2021-04-27

## 2021-04-26 MED ORDER — ASPIRIN 81 MG PO CHEW
81.0000 mg | CHEWABLE_TABLET | ORAL | Status: AC
Start: 2021-04-27 — End: 2021-04-27
  Administered 2021-04-27: 81 mg via ORAL
  Filled 2021-04-26: qty 1

## 2021-04-26 NOTE — Research (Addendum)
TREATMENT ASSESSMENTS- All Intervals  SITE#    001          SUBJECT #    02         Date / time of Assessment: Date /  Time:  _11 / _OCT/ 2022  at 12:05 am          DD/ MMM/YYYY HH : MM   Patient's Initial Weight: 233.40  on 24-Apr-2021 at 1850   Estimated excess fluid volume: 10_lbs or 4.5 kg  Diuretic Administration:  []  Continuous    [x]   Bolus      []   Both   (Update Diuretic Log)        Total infused during dose finding phase (diuretic): __200_ mg  Total Urine: __1065__ ml  Urine Production Rate: ______ ml/hr  Total saline infused: ______ ml  Pump infusion rate: __40 ml/hr     SITE# 001     SUBJECT #     02           Treatment Assessment: (12 hours after Treatment initiation)  Date / time of Assessment:    Date /  Time:  _11/ _OCT/ _2022 at _10: 22  am  Estimated Excess Fluid;  3 Liters per Amy Cleg, NP note.   Diuretic Administration:  [x]  Continuous    []   Bolus      []   Both   (Update Diuretic Log)        Total infused (diuretic): __682_ mg  Total Urine: _2967  ml                                *Patient was given Metolazone 2.5 mg tablet         At 1230 pm per Dr. Clayborne Dana orders.  Urine Production Rate: _253_ ml/hr                                                                     Total saline infused: __471  ml  Pump infusion rate: _40_ ml/hr     SITE# 001     SUBJECT #    02  Treatment Assessment: (24 hours after Treatment initiation )  Date / time of Assessment:    Date /  Time:  11/ _OCT/ 2022  at _21:12  Diuretic Administration:  [x]  Continuous    []   Bolus      []   Both   (Update Diuretic Log)        Total infused (diuretic): _1105_ mg  Total Urine: _5265 ml  Urine Production Rate: _357_ ml/hr  Total saline infused: _893 ml         Pump infusion rate: _40_ ml/hr    SITE # 001    SUBJECT #  02                Assessment at 36 hours  Date / time of Assessment:    Date /  Time: _12 / _OCT/ __2022 at _09:30_am__         Estimated excess fluid volume: __less than 1 L  Diuretic Administration:  [x]  Continuous    []   Bolus      []   Both   (  Update Diuretic Log)        Total infused (diuretic): _1593_ mg  Total Urine: _7688_ ml  Urine Production Rate: _172_ ml/hr  Total saline infused: _1261_ ml  Pump infusion rate: __40 ml/hr                      Treatment Synopsis  SITE #  001         SUBJECT #  02  Date/ Time of hospital admission:   Date /  Time:  _10/ _OCT/ _22  at 18:_31       Physician(s) initiating procedure:   Dr. [name]  Reprieve DMS Unit # :    Console Serial Number: __[id number]_____      Single Use Set Lot Number: _[id number]  IV Placed:     Date /  Time:  10 / _OCT/ 2022  at _19:00  Foley Placed:  Date /  Time:  _10/ _OCT/ _2022   at _20:00        Reprieve System Initiation:Date / Time:  10/OCT/ _2022   at _2105__       Reprieve System Completion/Termination: Date /  Time:  _12 / OCT/ 2022  at _09:30_am       Foley Removal:     Date /  Time:  _12 / OCT/ _2022  at _09:46 am_       IV Removed:  Date /  Time:  ___ / ____/ ____   at ___:____      [x]   Time unknown     Patient's IVs (2) were not removed after Treatment ended; she is having a right heart cath on 27-Apr-2021  Reason for System Termination:  [x]  Completed treatment course as planned  [x]  Physician triggered   []  System triggered  Patient Weight at Completion of Treatment; 225.20 lbs   26-Apr-2021 at 10:00 am     Total Time on the Reprieve Cardiovascular system:    __36 hours  25  minutes  Form is based on Pages 22-24 of [place]  eCRFs    Protocol [id number]

## 2021-04-26 NOTE — TOC Initial Note (Signed)
Transition of Care [facility]) - Initial/Assessment Note    Patient Details  Name: Dana [name] MRN: [id number] Date of Birth: 02/16/1961  [facility] of [facility]) CM/SW Contact:    Erenest Rasher, RN Phone Number: [phone] 04/26/2021, 8:52 AM  Clinical Narrative:                 HF TOC CM spoke to pt and husband and dtr at bedside. Pt was sleeping but husband answered questions. Pt had [place] recently for [facility]. Contacted rep, Ramond Marrow and she will follow for possible HH needs at dc. Pt has RW, cane, CPAP and oxygen at home. Will continue to follow for dc needs.   Expected Discharge Plan: [place] Barriers to Discharge: Continued Medical Work up   Patient Goals and CMS Choice Patient states their goals for this hospitalization and ongoing recovery are:: return home CMS Medicare.gov Compare Post Acute Care list provided to:: Patient Represenative (must comment) (husband) Choice offered to / list presented to : Spouse  Expected Discharge Plan and Services Expected Discharge Plan: [place] In-house Referral: Clinical Social Work   Post Acute Care Choice: [place] arrangements for the past 2 months: [place] Agency: [place] ([place]) Date [place]: 04/25/21 Time [place]: [place] Representative spoke with at [place]: Wylene Men  Prior Living Arrangements/Services Living arrangements for the past 2 months: [place] with:: Spouse Patient language and need for interpreter reviewed:: Yes Do you feel safe going back to the place where you live?: Yes      Need for Family Participation in Patient Care: No (Comment) Care giver support system in place?: No (comment) Current home services: DME (rolling walker, cane, CPAP, oxygen ([employer] [facility])) Criminal Activity/Legal Involvement Pertinent to Current  Situation/Hospitalization: No - Comment as needed  Activities of Daily Living Home Assistive Devices/Equipment: CBG Meter, CPAP, Eyeglasses, Oxygen, Grab bars in shower, Cane (specify quad or straight), Built-in shower seat, Walker (specify type) ADL Screening (condition at time of admission) Patient's cognitive ability adequate to safely complete daily activities?: Yes Is the patient deaf or have difficulty hearing?: No Does the patient have difficulty seeing, even when wearing glasses/contacts?: No Does the patient have difficulty concentrating, remembering, or making decisions?: No Patient able to express need for assistance with ADLs?: Yes Does the patient have difficulty dressing or bathing?: No Independently performs ADLs?: Yes (appropriate for developmental age) Does the patient have difficulty walking or climbing stairs?: Yes Weakness of Legs: Both Weakness of Arms/Hands: None  Permission Sought/Granted Permission sought to share information with : Case Manager, PCP, Family Supports Permission granted to share information with : Yes, Verbal Permission Granted  Share Information with NAME: Katalin Colledge  Permission granted to share info w AGENCY: Home Health, DME  Permission granted to share info w Relationship: husband  Permission granted to share info w Contact Information: [phone]  Emotional Assessment Appearance:: Appears stated age Attitude/Demeanor/Rapport: Lethargic Affect (typically observed): Accepting Orientation: : Oriented to Place, Oriented to  Time, Oriented to Self, Oriented to Situation   Psych Involvement: No (comment)  Admission diagnosis:  Acute on chronic diastolic heart failure (HCC) [I50.33] Patient Active Problem List   Diagnosis Date Noted   Acute on chronic  diastolic heart failure ([place]) 04/24/2021   PEA (Pulseless electrical activity) ([place]) 04/19/2021   Anemia due to stage 4 chronic kidney disease ([place]) 04/04/2021   Cognitive communication  deficit 02/13/2021   History of cardiac arrest 02/06/2021   Right hand paresthesia 01/25/2021   Bilateral lower extremity edema 01/23/2021   Anoxic brain injury ([place]) 01/20/2021   Impaired mobility and ADLs 01/20/2021   Left-sided weakness 01/20/2021   Memory deficits 01/20/2021   Statin myopathy 01/03/2021   Secondary hyperparathyroidism of renal origin ([place]) 11/18/2020   Vitamin D deficiency 11/18/2020   Proteinuria 10/13/2020   Stage 3b chronic kidney disease ([place]) 10/13/2020   Chest pain 09/29/2020   ACS (acute coronary syndrome) ([place])    Contrast dye induced nephropathy, possible 09/03/2020   OSA on CPAP 09/03/2020   Need for COVID-19 vaccine 06/28/2020   Medicare annual wellness visit, subsequent 06/13/2020   Chronic respiratory failure with hypoxia ([place]) 03/23/2020   Chronic gout without tophus 03/23/2020   Pain in both hands 65/53/7482   Uncomplicated opioid dependence ([place]) 03/23/2020   Depression, major, recurrent, mild ([place]) 12/21/2019   Hypomagnesemia 12/21/2019   Diarrhea 12/21/2019   Lumbar back pain 10/05/2019   Diabetic nephropathy associated with type 2 diabetes mellitus ([place]) 10/05/2019   Hypertensive heart and renal disease with congestive heart failure ([place]) 09/18/2019   Demand ischemia (HCC)    Diastolic heart failure ([place]) 11/07/2017   Morbid obesity with BMI of 45.0-49.9, adult ([place]) 11/07/2017   Coronary artery disease involving native coronary artery of native heart without angina pectoris 12/25/2016   Hyperlipidemia LDL goal <70 12/14/2016   Anemia 03/23/2016   Chronic pain syndrome 03/23/2016   COPD (chronic obstructive pulmonary disease) ([place]) 03/23/2016   GERD without esophagitis 03/23/2016   On home oxygen therapy 03/23/2016   S/P ablation of atrial fibrillation 10/04/2015   Diabetic polyneuropathy associated with type 2 diabetes mellitus ([place]) 04/02/2014   PCP:  [name], MD Pharmacy:   [facility], [place] [address] [place] [address] Phone: [phone] Fax: [phone]  [facility]  ([place], [place] [facility] [address] [place] [address] Phone: [phone] Fax: [phone]     Social Determinants of Health (SDOH) Interventions    Readmission Risk Interventions Readmission Risk Prevention Plan 10/06/2020 10/04/2020 09/03/2020  Transportation Screening Complete Complete Complete  PCP or Specialist Appt within 3-5 Days Complete - (No Data)  [place] or [place] - - Complete  Social Work Consult for [place] Planning/Counseling Complete - Complete  Palliative Care Screening Not Applicable Not Applicable Not Applicable  Medication Review (RN Care Manager) Complete Complete Complete  Some recent data might be hidden

## 2021-04-26 NOTE — H&P (View-Only) (Signed)
   Called by nursing for increased shortness of breath and intermittent chest heaviness/chest pain.   On aspirin + plavix.   Chest heaviness/pain 6/10.    BP 160/68 . In SR 90 bpm Sats 96%.   EKG SR 88 bpm   Cycle HS Trop  14:52 Given SL NTG x 2 . Pain now < 3 after 2nd SL NTG. BP now 132/54 Increase imdur to 60 mg daily.    Had [facility] 09/2020 3 vessel CAD with moderate mid-LAD and mid-RCA stenoses, and severe mid-LCx stenosis 2. Successful PCI of the mid-circumflex, reducing severe 90% stenosis to 0% with a 2.75x22 mm Resolute Onyx DES, provisional side branch angioplasty of the OM1 with a 2.5 mm balloon through the stent struts  Dana Mccrone  NP-C  2:46 PM

## 2021-04-26 NOTE — Progress Notes (Signed)
     Targeted Physical Exam    Exam Performed? [x] Yes  []  No  Date/Time of Assessment 04-26-21 09:34  Time unknown        Height 64 in   [] Not done  Estimated pounds over dry weight 226.8 lb   [] Not done  Estimated excess fluid volume 1 L [] Not done   Is subject experiencing Shortness of Breath? [] Yes [x] No [] Not done  Dyspnea VAS: (10 being the worst) 1       Is subject experiencing Orthopnea? [] Yes [x] No [] Not done  Is there evidence of Rales? [] Yes [x] No [] Not done  []  Rhonchi []  Crackles []  Wheezing []  Stridor      Is there presence of ascites? [] Yes [x] No [] Not done  Is there leg Edema? [] Yes [x] No [] Not done  Grade of edema         Is there sacral Edema? [] Yes [x] No [] Not done  Grade of edema         Is there Jugular Vein Distention (JVD)? [] Yes [x] No [] Not done       Provide measurement  cm  NYHA Classification: II    Has subject expressed presence of thirst?  [] Yes [x] No [] Not done       Rate of thirst level (10 being the worst):     Stopping dose. Foley removed.   Aditya Nastasi NP-C  9:35 AM

## 2021-04-26 NOTE — Progress Notes (Addendum)
Advanced Heart Failure Rounding Note  PCP-Cardiologist: [name], MD   Subjective:    Admitted with volume overload. 10/10 Placed on FASTR trial.   Overall negative 6.4 liters.  Weight down 9 pounds.   Creatinine 2.2>2.3>2.4   Feels better. Denies SOB.   Objective:   Weight Range: 102.9 kg Body mass index is 37.74 kg/m.   Vital Signs:   Temp:  [97.9 F (36.6 C)-98.6 F (37 C)] 98 F (36.7 C) (10/12 0707) Pulse Rate:  [68-76] 70 (10/12 0707) Resp:  [14-20] 20 (10/12 0707) BP: (125-161)/(48-67) 125/67 (10/12 0707) SpO2:  [97 %-100 %] 99 % (10/12 0707) Weight:  [102.9 kg-104 kg] 102.9 kg (10/12 0539)    Weight change: Filed Weights   04/25/21 0408 04/25/21 2100 04/26/21 0539  Weight: 106.6 kg 104 kg 102.9 kg    Intake/Output:   Intake/Output Summary (Last 24 hours) at 04/26/2021 0853 Last data filed at 04/26/2021 7062 Gross per 24 hour  Intake 1443.6 ml  Output 5008 ml  Net -3564.4 ml      Physical Exam  General:   No resp difficulty HEENT: normal Neck: supple. no JVD. Carotids 2+ bilat; no bruits. No lymphadenopathy or thryomegaly appreciated. Cor: PMI nondisplaced. Regular rate & rhythm. No rubs, gallops or murmurs. Lungs: clear Abdomen: obese, soft, nontender, nondistended. No hepatosplenomegaly. No bruits or masses. Good bowel sounds. Extremities: no cyanosis, clubbing, rash, R and LLE unna boots.No edema Neuro: alert & orientedx3, cranial nerves grossly intact. moves all 4 extremities w/o difficulty. Affect pleasant GU : clear yellow urine. Foley   Telemetry   SR 60-70s   EKG    N/A  Labs    CBC Recent Labs    04/24/21 2015 04/26/21 0120  WBC 11.1* 10.5  HGB 8.4* 8.2*  HCT 28.3* 26.7*  MCV 90.1 87.3  PLT 320 376   Basic Metabolic Panel Recent Labs    04/25/21 0759 04/25/21 1514 04/25/21 2030 04/26/21 0120  NA 139   < > 139 139  K 4.1   < > 3.9 4.4  CL 104   < > 102 102  CO2 24   < > 25 27  GLUCOSE 122*   < >  153* 164*  BUN 52*   < > 57* 57*  CREATININE 2.23*   < > 2.35* 2.42*  CALCIUM 8.5*   < > 8.8* 9.0  MG 2.0  --  2.0  --    < > = values in this interval not displayed.   Liver Function Tests Recent Labs    04/25/21 0759 04/25/21 2030  AST 17 15  ALT 15 14  ALKPHOS 96 99  BILITOT 1.2 0.8  PROT 6.9 6.9  ALBUMIN 3.2* 3.1*   No results for input(s): LIPASE, AMYLASE in the last 72 hours. Cardiac Enzymes No results for input(s): CKTOTAL, CKMB, CKMBINDEX, TROPONINI in the last 72 hours.  BNP: BNP (last 3 results) Recent Labs    08/29/20 0636 09/29/20 0724 04/12/21 1130  BNP 87.6 268.7* 353.3*    ProBNP (last 3 results) Recent Labs    10/12/20 1356  PROBNP 3,407*     D-Dimer No results for input(s): DDIMER in the last 72 hours. Hemoglobin A1C Recent Labs    04/25/21 0020  HGBA1C 6.6*   Fasting Lipid Panel No results for input(s): CHOL, HDL, LDLCALC, TRIG, CHOLHDL, LDLDIRECT in the last 72 hours. Thyroid Function Tests Recent Labs    04/24/21 1250  TSH 4.014    Other  results:   Imaging    ECHOCARDIOGRAM COMPLETE  Result Date: 04/25/2021    ECHOCARDIOGRAM REPORT   Patient Name:   ISABELLAROSE KOPE Date of Exam: 04/25/2021 Medical Rec #:  [id number]      Height:       65.0 in Accession #:    [id number]     Weight:       235.0 lb Date of Birth:  [birth year]      BSA:          2.119 m Patient Age:    43 years       BP:           132/51 mmHg Patient Gender: F              HR:           72 bpm. Exam Location:  Inpatient Procedure: 2D Echo, Cardiac Doppler, Color Doppler and Intracardiac            Opacification Agent Indications:    CHF-Acute Diastolic Y85.02  History:        Patient has prior history of Echocardiogram examinations, most                 recent 09/30/2020. CHF, Previous Myocardial Infarction; Risk                 Factors:Hypertension, Diabetes and Dyslipidemia. On                 Reprieve-Guided diuretic therapy.  Sonographer:    Darlina Sicilian RDCS  Referring Phys: Emma  1. Left ventricular ejection fraction, by estimation, is 65 to 70%. The left ventricle has normal function. The left ventricle has no regional wall motion abnormalities. Left ventricular diastolic parameters are indeterminate.  2. Right ventricular systolic function is normal. The right ventricular size is normal. Tricuspid regurgitation signal is inadequate for assessing PA pressure.  3. The mitral valve is normal in structure. Moderate mitral valve regurgitation. No evidence of mitral stenosis.  4. The aortic valve was not well visualized. Aortic valve regurgitation is not visualized.  5. The inferior vena cava is dilated in size with >50% respiratory variability, suggesting right atrial pressure of 8 mmHg. Comparison(s): A prior study was performed on 09/30/20. No significant change from prior study. Best contrast administration in this study. FINDINGS  Left Ventricle: Left ventricular ejection fraction, by estimation, is 65 to 70%. The left ventricle has normal function. The left ventricle has no regional wall motion abnormalities. Definity contrast agent was given IV to delineate the left ventricular  endocardial borders. The left ventricular internal cavity size was normal in size. There is no left ventricular hypertrophy. Left ventricular diastolic parameters are indeterminate. Right Ventricle: The right ventricular size is normal. No increase in right ventricular wall thickness. Right ventricular systolic function is normal. Tricuspid regurgitation signal is inadequate for assessing PA pressure. Left Atrium: Left atrial size was normal in size. Right Atrium: Right atrial size was normal in size. Pericardium: There is no evidence of pericardial effusion. Mitral Valve: The mitral valve is normal in structure. Moderate mitral valve regurgitation. No evidence of mitral valve stenosis. Tricuspid Valve: The tricuspid valve is normal in structure. Tricuspid valve  regurgitation is not demonstrated. No evidence of tricuspid stenosis. Aortic Valve: The aortic valve was not well visualized. Aortic valve regurgitation is not visualized. Pulmonic Valve: The pulmonic valve was normal in structure. Pulmonic valve regurgitation is trivial. No evidence of pulmonic stenosis.  Aorta: The aortic root and ascending aorta are structurally normal, with no evidence of dilitation. Pulmonary Artery: The pulmonary artery is of normal size. Venous: The inferior vena cava is dilated in size with greater than 50% respiratory variability, suggesting right atrial pressure of 8 mmHg. IAS/Shunts: The atrial septum is grossly normal.  LEFT VENTRICLE PLAX 2D LVIDd:         5.15 cm      Diastology LVIDs:         3.20 cm      LV e' medial:    3.38 cm/s LV PW:         0.70 cm      LV E/e' medial:  37.9 LV IVS:        0.90 cm      LV e' lateral:   6.15 cm/s LVOT diam:     2.00 cm      LV E/e' lateral: 20.8 LV SV:         78 LV SV Index:   37 LVOT Area:     3.14 cm  LV Volumes (MOD) LV vol d, MOD A2C: 122.0 ml LV vol d, MOD A4C: 132.0 ml LV vol s, MOD A2C: 41.1 ml LV vol s, MOD A4C: 36.9 ml LV SV MOD A2C:     80.9 ml LV SV MOD A4C:     132.0 ml LV SV MOD BP:      87.6 ml RIGHT VENTRICLE RV S prime:     16.10 cm/s TAPSE (M-mode): 2.2 cm LEFT ATRIUM             Index LA diam:        4.10 cm 1.94 cm/m LA Vol (A2C):   33.3 ml 15.72 ml/m LA Vol (A4C):   35.9 ml 16.95 ml/m LA Biplane Vol: 36.4 ml 17.18 ml/m  AORTIC VALVE LVOT Vmax:   105.00 cm/s LVOT Vmean:  71.800 cm/s LVOT VTI:    0.249 m  AORTA Ao Root diam: 2.60 cm Ao Asc diam:  3.10 cm MITRAL VALVE MV Area (PHT): 4.49 cm       SHUNTS MV Decel Time: 169 msec       Systemic VTI:  0.25 m MR Peak grad:    80.6 mmHg    Systemic Diam: 2.00 cm MR Mean grad:    50.0 mmHg MR Vmax:         449.00 cm/s MR Vmean:        338.0 cm/s MR PISA:         1.01 cm MR PISA Eff ROA: 9 mm MR PISA Radius:  0.40 cm MV E velocity: 128.00 cm/s MV A velocity: 65.45 cm/s MV E/A  ratio:  1.96 Rudean Haskell MD Electronically signed by Rudean Haskell MD Signature Date/Time: 04/25/2021/4:27:57 PM    Final      Medications:     Scheduled Medications:  allopurinol  100 mg Oral Daily   aspirin EC  81 mg Oral Daily   carvedilol  12.5 mg Oral BID WC   Chlorhexidine Gluconate Cloth  6 each Topical Daily   clopidogrel  75 mg Oral QHS   enoxaparin (LOVENOX) injection  40 mg Subcutaneous Q24H   ezetimibe  10 mg Oral Daily   ferrous sulfate  325 mg Oral BID WC   hydrALAZINE  25 mg Oral TID   insulin aspart  0-15 Units Subcutaneous TID WC   insulin aspart  0-5 Units Subcutaneous QHS   insulin detemir  30  Units Subcutaneous QHS   isosorbide mononitrate  30 mg Oral Daily   pantoprazole  40 mg Oral Daily   predniSONE  40 mg Oral Q breakfast   sodium chloride flush  3 mL Intravenous Q12H    Infusions:  sodium chloride     promethazine (PHENERGAN) injection (IM or IVPB) 12.5 mg (04/25/21 1113)   FASTR furosemide 40 mg/hr (04/26/21 0411)    PRN Medications: sodium chloride, acetaminophen, HYDROcodone-acetaminophen, LORazepam, nitroGLYCERIN, promethazine (PHENERGAN) injection (IM or IVPB), sodium chloride flush    Patient Profile   Ms Moten is 60 y.o. female with history of diastolic heart failure, CAD, DES RCA/LAD 2018, DMI, htn, hyperlipidemia, PAD, and OSA on CPAP, PAF s/p ablation, PAD s/p L great toe and 4th toe amputation.  Admitted with volume overload. Placed on FASTR trial.   Assessment/Plan   1. Acute on Chronic Diastolic Heart Failure - Echo 08/2020 EF 55-60%  - Echo (3/22): EF 55-60%, Grade II DD, normal RV - Echo (6/22): EF 60%, mod MR/TR (per chart review at [facility]). - now in a/c CHF w/ NYHA Class IIIb symptoms and persistent, marked volume overload, despite outpatient diuretic titration, c/b AKI on Stage IV CKD w recent bump in SCr from baseline of 1.7>>2.4.  CardioMEMs at 16 (goal =10). ReDs clip also elevated at 48% on admit. Reds  Clip 47% today.  - 10/10 given 120 mg x 1.  Started on FASTR Trial.  - On 10/11 given metolazone. Brisk diuresis noted. Volume status improved.  - Stop Reprieve/FASTR. Check RHC tomorrow. Tomorrow start torsemide. Wi -  Continue carvedilol 12.5 mg bid. - Continue hydralazine 25 mg tid + Imdur 30 mg daily. - No SGLT2i with T1DM. - Continue unna boots.    2. CAD - Known CAD with prior stents to mid LAD and pRCA.  - NSTEMI (2/22). Cath deferred due to AKI. Treated medically.  - NSTEMI (7/22)-->LHC and received PCI/DES to mid circ, balloon angioplasty to OM1  - Reports chest tightness but likely 2/2 fluid overload. EKG nonischemic.  HS Trop 55>58  - Continue ASA + Plavix for a minimum of 12 months. - Lipids under good control w/ Repatha + Zetia (LDL 66 7/22). Followed by Dr. Debara Pickett.    3. AKI CKD IV  - prior h/o CIN following LHC - Followed by Nephrology in [place] - SCr baseline 1.5 w/ recent bump to 2.4 - Creatinine 2.3>2.4     4. OSA - Order CPAP qhs.    5. DM1 - Hgb A1c 6.6. - continue home insulin regimen    6. Chronic Anemia - Baseline Hgb 8-9 range, likely 2/2 renal disease. - hgb on admit 8.4  - Check anemia panel.    7. Acute Gout Flare Uric Acid 9.3. Day 2/3 Prednisone.    Stop Reprieve/FastR. RHC tomorrow.   Length of Stay: 2  Darrick Grinder, NP  04/26/2021, 8:53 AM  Advanced Heart Failure Team Pager [phone] (M-F; 7a - 5p)  Please contact [facility] for night-coverage after hours (5p -7a ) and weekends on [email or web address]  Patient seen and examined with the above-signed Advanced Practice Provider and/or Housestaff. I personally reviewed laboratory data, imaging studies and relevant notes. I independently examined the patient and formulated the important aspects of the plan. I have edited the note to reflect any of my changes or salient points. I have personally discussed the plan with the patient and/or family.  Volumes status looks great. Weight down 11 pounds in 2  days on Reprieve device.  She feels much better. Electrolytes look good. Creatinine up slightly. Denies orthopnea or PND.   General:  Well appearing. No resp difficulty HEENT: normal Neck: supple. no JVD. Carotids 2+ bilat; no bruits. No lymphadenopathy or thryomegaly appreciated. Cor: PMI nondisplaced. Regular rate & rhythm. No rubs, gallops or murmurs. Lungs: clear Abdomen: soft, nontender, nondistended. No hepatosplenomegaly. No bruits or masses. Good bowel sounds. Extremities: no cyanosis, clubbing, rash, edema Neuro: alert & orientedx3, cranial nerves grossly intact. moves all 4 extremities w/o difficulty. Affect pleasant  Much improved. SCr up slightly. Will hold diuretics today. Plan RHC and possible d/c tomorrow.   [name], MD  1:44 PM

## 2021-04-26 NOTE — Progress Notes (Signed)
Reprieve machine disconnected per order-foley d/c'd. Patient in bed with no needs at this time.

## 2021-04-26 NOTE — Research (Signed)
     BASELINE AUDIOGRAM

## 2021-04-26 NOTE — Progress Notes (Signed)
   Called by nursing for increased shortness of breath and intermittent chest heaviness/chest pain.   On aspirin + plavix.   Chest heaviness/pain 6/10.    BP 160/68 . In SR 90 bpm Sats 96%.   EKG SR 88 bpm   Cycle HS Trop  14:52 Given SL NTG x 2 . Pain now < 3 after 2nd SL NTG. BP now 132/54 Increase imdur to 60 mg daily.    Had [facility] 09/2020 3 vessel CAD with moderate mid-LAD and mid-RCA stenoses, and severe mid-LCx stenosis 2. Successful PCI of the mid-circumflex, reducing severe 90% stenosis to 0% with a 2.75x22 mm Resolute Onyx DES, provisional side branch angioplasty of the OM1 with a 2.5 mm balloon through the stent struts  Saintclair Schroader  NP-C  2:46 PM

## 2021-04-27 ENCOUNTER — Encounter: Payer: Self-pay | Admitting: Family Medicine

## 2021-04-27 ENCOUNTER — Encounter: Payer: Self-pay | Admitting: *Deleted

## 2021-04-27 ENCOUNTER — Encounter (HOSPITAL_COMMUNITY): Payer: Self-pay | Admitting: Internal Medicine

## 2021-04-27 ENCOUNTER — Encounter (HOSPITAL_COMMUNITY)
Admission: AD | Disposition: A | Discharge status: Home / Self Care | Payer: Self-pay | Source: Ambulatory Visit | Attending: Internal Medicine

## 2021-04-27 DIAGNOSIS — I5033 Acute on chronic diastolic (congestive) heart failure: Secondary | ICD-10-CM | POA: Diagnosis not present

## 2021-04-27 DIAGNOSIS — N184 Chronic kidney disease, stage 4 (severe): Secondary | ICD-10-CM | POA: Diagnosis not present

## 2021-04-27 DIAGNOSIS — I2510 Atherosclerotic heart disease of native coronary artery without angina pectoris: Secondary | ICD-10-CM | POA: Diagnosis not present

## 2021-04-27 DIAGNOSIS — N179 Acute kidney failure, unspecified: Secondary | ICD-10-CM | POA: Diagnosis not present

## 2021-04-27 DIAGNOSIS — Z006 Encounter for examination for normal comparison and control in clinical research program: Secondary | ICD-10-CM

## 2021-04-27 HISTORY — PX: RIGHT HEART CATH: CATH118263

## 2021-04-27 LAB — POCT I-STAT EG7
Acid-Base Excess: 5 mmol/L — ABNORMAL HIGH (ref 0.0–2.0)
Acid-Base Excess: 5 mmol/L — ABNORMAL HIGH (ref 0.0–2.0)
Bicarbonate: 30.2 mmol/L — ABNORMAL HIGH (ref 20.0–28.0)
Bicarbonate: 30.2 mmol/L — ABNORMAL HIGH (ref 20.0–28.0)
Calcium, Ion: 1.18 mmol/L (ref 1.15–1.40)
Calcium, Ion: 1.21 mmol/L (ref 1.15–1.40)
HCT: 28 % — ABNORMAL LOW (ref 36.0–46.0)
HCT: 28 % — ABNORMAL LOW (ref 36.0–46.0)
Hemoglobin: 9.5 g/dL — ABNORMAL LOW (ref 12.0–15.0)
Hemoglobin: 9.5 g/dL — ABNORMAL LOW (ref 12.0–15.0)
O2 Saturation: 69 %
O2 Saturation: 70 %
Potassium: 3.6 mmol/L (ref 3.5–5.1)
Potassium: 3.6 mmol/L (ref 3.5–5.1)
Sodium: 138 mmol/L (ref 135–145)
Sodium: 139 mmol/L (ref 135–145)
TCO2: 32 mmol/L (ref 22–32)
TCO2: 32 mmol/L (ref 22–32)
pCO2, Ven: 48.4 mmHg (ref 44.0–60.0)
pCO2, Ven: 49.2 mmHg (ref 44.0–60.0)
pH, Ven: 7.395 (ref 7.250–7.430)
pH, Ven: 7.403 (ref 7.250–7.430)
pO2, Ven: 37 mmHg (ref 32.0–45.0)
pO2, Ven: 37 mmHg (ref 32.0–45.0)

## 2021-04-27 LAB — BASIC METABOLIC PANEL
Anion gap: 12 (ref 5–15)
BUN: 78 mg/dL — ABNORMAL HIGH (ref 6–20)
CO2: 27 mmol/L (ref 22–32)
Calcium: 9.2 mg/dL (ref 8.9–10.3)
Chloride: 95 mmol/L — ABNORMAL LOW (ref 98–111)
Creatinine, Ser: 2.93 mg/dL — ABNORMAL HIGH (ref 0.44–1.00)
GFR, Estimated: 18 mL/min — ABNORMAL LOW (ref >60)
Glucose, Bld: 305 mg/dL — ABNORMAL HIGH (ref 70–99)
Potassium: 3.8 mmol/L (ref 3.5–5.1)
Sodium: 134 mmol/L — ABNORMAL LOW (ref 135–145)

## 2021-04-27 LAB — POTASSIUM, URINE, 24 HOUR
Potassium Urine: 10.5 mmol/L
Potassium, Urine: 68 mmol/24 hr

## 2021-04-27 LAB — COMPREHENSIVE METABOLIC PANEL
ALT: 12 U/L (ref 0–44)
AST: 20 U/L (ref 15–41)
Albumin: 3.5 g/dL (ref 3.5–5.0)
Alkaline Phosphatase: 92 U/L (ref 38–126)
Anion gap: 13 (ref 5–15)
BUN: 87 mg/dL — ABNORMAL HIGH (ref 6–20)
CO2: 26 mmol/L (ref 22–32)
Calcium: 9.1 mg/dL (ref 8.9–10.3)
Chloride: 98 mmol/L (ref 98–111)
Creatinine, Ser: 2.92 mg/dL — ABNORMAL HIGH (ref 0.44–1.00)
GFR, Estimated: 18 mL/min — ABNORMAL LOW (ref >60)
Glucose, Bld: 144 mg/dL — ABNORMAL HIGH (ref 70–99)
Potassium: 4.2 mmol/L (ref 3.5–5.1)
Sodium: 137 mmol/L (ref 135–145)
Total Bilirubin: 0.6 mg/dL (ref 0.3–1.2)
Total Protein: 7.5 g/dL (ref 6.5–8.1)

## 2021-04-27 LAB — GLUCOSE, CAPILLARY
Glucose-Capillary: 231 mg/dL — ABNORMAL HIGH (ref 70–99)
Glucose-Capillary: 148 mg/dL — ABNORMAL HIGH (ref 70–99)
Glucose-Capillary: 154 mg/dL — ABNORMAL HIGH (ref 70–99)
Glucose-Capillary: 318 mg/dL — ABNORMAL HIGH (ref 70–99)
Glucose-Capillary: 303 mg/dL — ABNORMAL HIGH (ref 70–99)

## 2021-04-27 LAB — CHLORIDE, URINE, TIMED
Chloride Urine: 756 mmol/24 hr
Chloride, Ur: 117 mmol/L

## 2021-04-27 LAB — SODIUM, URINE, 24 HOUR
Sodium, 24H Ur: 846 mmol/24 hr
Sodium, Ur: 131 mmol/L

## 2021-04-27 LAB — CBC
HCT: 28.7 % — ABNORMAL LOW (ref 36.0–46.0)
Hemoglobin: 9.1 g/dL — ABNORMAL LOW (ref 12.0–15.0)
MCH: 27.4 pg (ref 26.0–34.0)
MCHC: 31.7 g/dL (ref 30.0–36.0)
MCV: 86.4 fL (ref 80.0–100.0)
Platelets: 326 10*3/uL (ref 150–400)
RBC: 3.32 MIL/uL — ABNORMAL LOW (ref 3.87–5.11)
RDW: 15.4 % (ref 11.5–15.5)
WBC: 11.3 10*3/uL — ABNORMAL HIGH (ref 4.0–10.5)
nRBC: 0 % (ref 0.0–0.2)

## 2021-04-27 LAB — MAGNESIUM: Magnesium: 2.2 mg/dL (ref 1.7–2.4)

## 2021-04-27 LAB — SPECIFIC GRAVITY, URINE: Specific Gravity, UA: 1.007 (ref 1.005–1.030)

## 2021-04-27 LAB — OSMOLALITY, URINE: Osmolality, Ur: 304 mOsmol/kg

## 2021-04-27 LAB — CREATININE, URINE, 24 HOUR
Creatinine, 24H Ur: 1130 mg/24 hr
Creatinine, Urine: 17.5 mg/dL

## 2021-04-27 SURGERY — RIGHT HEART CATH
Anesthesia: LOCAL

## 2021-04-27 MED ORDER — LIDOCAINE HCL (PF) 1 % IJ SOLN
INTRAMUSCULAR | Status: DC | PRN
  Administered 2021-04-27: 2 mL

## 2021-04-27 MED ORDER — ENOXAPARIN SODIUM 40 MG/0.4ML IJ SOSY
40.0000 mg | PREFILLED_SYRINGE | INTRAMUSCULAR | Status: DC
  Administered 2021-04-29: 40 mg via SUBCUTANEOUS
  Filled 2021-04-27: qty 0.4

## 2021-04-27 MED ORDER — ACETAMINOPHEN 325 MG PO TABS: 650.0000 mg | ORAL_TABLET | ORAL | Status: DC | PRN

## 2021-04-27 MED ORDER — ONDANSETRON HCL 4 MG/2ML IJ SOLN
4.0000 mg | Freq: Four times a day (QID) | INTRAMUSCULAR | Status: DC | PRN
  Administered 2021-04-27: 4 mg via INTRAVENOUS
  Filled 2021-04-27: qty 2

## 2021-04-27 MED ORDER — HYDRALAZINE HCL 20 MG/ML IJ SOLN: 10.0000 mg | INTRAMUSCULAR | Status: AC | PRN

## 2021-04-27 MED ORDER — INSULIN ASPART 100 UNIT/ML IJ SOLN
0.0000 [IU] | Freq: Three times a day (TID) | INTRAMUSCULAR | Status: DC
  Administered 2021-04-27: 3 [IU] via SUBCUTANEOUS
  Administered 2021-04-27: 4 [IU] via SUBCUTANEOUS
  Administered 2021-04-27: 15 [IU] via SUBCUTANEOUS
  Filled 2021-04-27: qty 0.2

## 2021-04-27 MED ORDER — INSULIN ASPART 100 UNIT/ML IJ SOLN
6.0000 [IU] | Freq: Three times a day (TID) | INTRAMUSCULAR | Status: DC
  Administered 2021-04-27 – 2021-04-29 (×3): 6 [IU] via SUBCUTANEOUS

## 2021-04-27 MED ORDER — ENOXAPARIN SODIUM 40 MG/0.4ML IJ SOSY
40.0000 mg | PREFILLED_SYRINGE | INTRAMUSCULAR | Status: DC
  Filled 2021-04-27: qty 0.4

## 2021-04-27 MED ORDER — SODIUM CHLORIDE 0.9 % IV SOLN
INTRAVENOUS | Status: DC
Start: 2021-04-27 — End: 2021-04-28

## 2021-04-27 MED ORDER — HEPARIN (PORCINE) IN NACL 1000-0.9 UT/500ML-% IV SOLN
INTRAVENOUS | Status: DC | PRN
  Administered 2021-04-27: 500 mL

## 2021-04-27 MED ORDER — SODIUM CHLORIDE 0.9 % IV SOLN: 250.0000 mL | INTRAVENOUS | Status: DC | PRN

## 2021-04-27 MED ORDER — SODIUM CHLORIDE 0.9% FLUSH
3.0000 mL | INTRAVENOUS | Status: DC | PRN
  Administered 2021-04-28: 3 mL via INTRAVENOUS

## 2021-04-27 MED ORDER — LABETALOL HCL 5 MG/ML IV SOLN: 10.0000 mg | INTRAVENOUS | Status: AC | PRN

## 2021-04-27 MED ORDER — SODIUM CHLORIDE 0.9% FLUSH
3.0000 mL | Freq: Two times a day (BID) | INTRAVENOUS | Status: DC
  Administered 2021-04-27 – 2021-04-29 (×3): 3 mL via INTRAVENOUS

## 2021-04-27 MED ORDER — HEPARIN (PORCINE) IN NACL 1000-0.9 UT/500ML-% IV SOLN
INTRAVENOUS | Status: AC
  Filled 2021-04-27: qty 500

## 2021-04-27 MED ORDER — LIDOCAINE HCL (PF) 1 % IJ SOLN
INTRAMUSCULAR | Status: AC
  Filled 2021-04-27: qty 30

## 2021-04-27 SURGICAL SUPPLY — 7 items
CATH BALLN WEDGE 5F 110CM (CATHETERS) ×2 IMPLANT
PACK CARDIAC CATHETERIZATION (CUSTOM PROCEDURE TRAY) ×2 IMPLANT
PROTECTION STATION PRESSURIZED (MISCELLANEOUS) ×2
SHEATH GLIDE SLENDER 4/5FR (SHEATH) ×2 IMPLANT
STATION PROTECTION PRESSURIZED (MISCELLANEOUS) ×1 IMPLANT
TRANSDUCER W/STOPCOCK (MISCELLANEOUS) ×2 IMPLANT
WIRE EMERALD 3MM-J .025X260CM (WIRE) ×2 IMPLANT

## 2021-04-27 NOTE — Progress Notes (Addendum)
Advanced Heart Failure Rounding Note  PCP-Cardiologist: [name], MD   Subjective:    Admitted with volume overload. 10/10 Placed on FASTR trial.   Wt overall down 12 lb. Feels better. Less dyspnea.   Creatinine continues to rise 2.2>2.3>2.4>2.9   Had mild chest pain yesterday. Hs trops low level and flat, 55>>58>>66>>62. Currently CP free.   Going for RHC today.   Objective:   Weight Range: 102.1 kg Body mass index is 37.46 kg/m.   Vital Signs:   Temp:  [98.4 F (36.9 C)-98.8 F (37.1 C)] 98.6 F (37 C) (10/13 0425) Pulse Rate:  [77-88] 81 (10/13 0425) Resp:  [11-21] 19 (10/13 0425) BP: (122-132)/(47-56) 122/51 (10/13 0425) SpO2:  [90 %-96 %] 95 % (10/13 0425) Weight:  [102.1 kg] 102.1 kg (10/13 0440)    Weight change: Filed Weights   04/25/21 2100 04/26/21 0539 04/27/21 0440  Weight: 104 kg 102.9 kg 102.1 kg    Intake/Output:   Intake/Output Summary (Last 24 hours) at 04/27/2021 0716 Last data filed at 04/27/2021 0610 Gross per 24 hour  Intake 691.94 ml  Output 2216 ml  Net -1524.06 ml      Physical Exam   General:  Well appearing, moderately obese, sitting up in bed. No respiratory difficulty HEENT: normal Neck: supple. Thick neck JVD not well visualized. Carotids 2+ bilat; no bruits. No lymphadenopathy or thyromegaly appreciated. Cor: PMI nondisplaced. Regular rate & rhythm. No rubs, gallops or murmurs. Lungs: clear Abdomen: obese, soft, nontender, nondistended. No hepatosplenomegaly. No bruits or masses. Good bowel sounds. Extremities: no cyanosis, clubbing, rash, edema Neuro: alert & oriented x 3, cranial nerves grossly intact. moves all 4 extremities w/o difficulty. Affect pleasant.   Telemetry   NSR 70s, 9 beat run NSVT   EKG    N/A   Labs    CBC Recent Labs    04/26/21 0120 04/26/21 2114  WBC 10.5 12.8*  HGB 8.2* 9.5*  HCT 26.7* 29.8*  MCV 87.3 86.1  PLT 283 644   Basic Metabolic Panel Recent Labs     04/26/21 0900 04/26/21 2114 04/27/21 0223  NA 138 131* 134*  K 4.3 4.5 3.8  CL 98 94* 95*  CO2 29 25 27   GLUCOSE 191* 488* 305*  BUN 59* 76* 78*  CREATININE 2.36* 2.80* 2.93*  CALCIUM 9.0 9.0 9.2  MG 2.1 2.0  --    Liver Function Tests Recent Labs    04/26/21 0900 04/26/21 2114  AST 16 16  ALT 14 14  ALKPHOS 102 100  BILITOT 1.2 1.1  PROT 7.7 7.8  ALBUMIN 3.4* 3.5   No results for input(s): LIPASE, AMYLASE in the last 72 hours. Cardiac Enzymes No results for input(s): CKTOTAL, CKMB, CKMBINDEX, TROPONINI in the last 72 hours.  BNP: BNP (last 3 results) Recent Labs    08/29/20 0636 09/29/20 0724 04/12/21 1130  BNP 87.6 268.7* 353.3*    ProBNP (last 3 results) Recent Labs    10/12/20 1356  PROBNP 3,407*     D-Dimer No results for input(s): DDIMER in the last 72 hours. Hemoglobin A1C Recent Labs    04/25/21 0020  HGBA1C 6.6*   Fasting Lipid Panel No results for input(s): CHOL, HDL, LDLCALC, TRIG, CHOLHDL, LDLDIRECT in the last 72 hours. Thyroid Function Tests Recent Labs    04/24/21 1250  TSH 4.014    Other results:   Imaging    No results found.   Medications:     Scheduled Medications:  allopurinol  100 mg Oral Daily   aspirin EC  81 mg Oral Daily   carvedilol  12.5 mg Oral BID WC   Chlorhexidine Gluconate Cloth  6 each Topical Daily   clopidogrel  75 mg Oral QHS   enoxaparin (LOVENOX) injection  40 mg Subcutaneous Q24H   ezetimibe  10 mg Oral Daily   ferrous sulfate  325 mg Oral BID WC   hydrALAZINE  25 mg Oral TID   insulin aspart  0-15 Units Subcutaneous TID WC   insulin aspart  0-5 Units Subcutaneous QHS   insulin detemir  35 Units Subcutaneous QHS   isosorbide mononitrate  60 mg Oral Daily   pantoprazole  40 mg Oral Daily   predniSONE  40 mg Oral Q breakfast   sodium chloride flush  3 mL Intravenous Q12H   sodium chloride flush  3 mL Intravenous Q12H    Infusions:  sodium chloride     sodium chloride     sodium  chloride 10 mL/hr at 04/27/21 0610   promethazine (PHENERGAN) injection (IM or IVPB) Stopped (04/26/21 2339)    PRN Medications: sodium chloride, sodium chloride, acetaminophen, HYDROcodone-acetaminophen, LORazepam, nitroGLYCERIN, promethazine (PHENERGAN) injection (IM or IVPB), sodium chloride flush, sodium chloride flush    Patient Profile   Dana [name] is 60 y.o. female with history of diastolic heart failure, CAD, DES RCA/LAD 2018, DMI, htn, hyperlipidemia, PAD, and OSA on CPAP, PAF s/p ablation, PAD s/p L great toe and 4th toe amputation.  Admitted with volume overload. Placed on FASTR trial.   Assessment/Plan   1. Acute on Chronic Diastolic Heart Failure - Echo 08/2020 EF 55-60%  - Echo (3/22): EF 55-60%, Grade II DD, normal RV - Echo (6/22): EF 60%, mod MR/TR (per chart review at [facility]). - now in a/c CHF w/ NYHA Class IIIb symptoms and persistent, marked volume overload, despite outpatient diuretic titration, c/b AKI on Stage IV CKD w recent bump in SCr from baseline of 1.7>>2.4.  CardioMEMs at 16 (goal =10). ReDs clip also elevated at 48% on admit.  - 10/10 given 120 mg x 1.  Started on FASTR Trial.  - On 10/11 given metolazone. Brisk diuresis noted. Volume status improved. Wt down 12 lb.  - Stopped Reprieve/FASTR on 10/12.  - RHC today to help guide further diuresis  - Continue carvedilol 12.5 mg bid. - Continue hydralazine 25 mg tid + Imdur 30 mg daily. - No SGLT2i with T1DM.    2. CAD - Known CAD with prior stents to mid LAD and pRCA.  - NSTEMI (2/22). Cath deferred due to AKI. Treated medically.  - NSTEMI (7/22)-->LHC and received PCI/DES to mid circ, balloon angioplasty to OM1  - Mild tightness but likely 2/2 fluid overload. EKG nonischemic.  HS Trop 55>58>>66>>62 - Continue ASA + Plavix for a minimum of 12 months. - Lipids under good control w/ Repatha + Zetia (LDL 66 7/22). Followed by Dr. Debara Pickett.    3. AKI CKD IV  - prior h/o CIN following LHC - Followed by  Nephrology in [place] - SCr baseline 1.5 w/ recent bump to 2.4 - Creatinine 2.3>2.4 >2.9    4. OSA -  CPAP qhs.    5. DM1 - Hgb A1c 6.6. - continue home insulin regimen    6. Chronic Anemia - Baseline Hgb 8-9 range, likely 2/2 renal disease. - hgb on admit 8.4  - Iron stores WNL  7. Acute Gout Flare Uric Acid 9.3. Day 3/3 Prednisone.   Length of Stay: 3  Lyda Jester, PA-C  04/27/2021, 7:16 AM  Advanced Heart Failure Team Pager [phone] (M-F; 7a - 5p)  Please contact [facility] for night-coverage after hours (5p -7a ) and weekends on [email or web address]  Patient seen and examined with the above-signed Advanced Practice Provider and/or Housestaff. I personally reviewed laboratory data, imaging studies and relevant notes. I independently examined the patient and formulated the important aspects of the plan. I have edited the note to reflect any of my changes or salient points. I have personally discussed the plan with the patient and/or family.  Feels ok today no further CP. SCr up to 2.9 today  RHC  as below  RA = 8 RV = 59/9 PA = 61/17 (35) PCW = mean 23 (v = 49) -> performed multiple wedges Fick cardiac output/index = PVR = 1.6 WU FA sat = 98% PA sat = 69%, 70% PaPi = 5.5 1. Mild to moderately elevated filling pressures with very large v-waves in PCWP tracing suggestive of severe diastolic dysfunction  vs severe MR  General:  Sitting up in bed No resp difficulty HEENT: normal Neck: supple. JVP 9 Carotids 2+ bilat; no bruits. No lymphadenopathy or thryomegaly appreciated. Cor: PMI nondisplaced. Regular rate & rhythm. 2/6 MR Lungs: clear Abdomen: obese soft, nontender, nondistended. No hepatosplenomegaly. No bruits or masses. Good bowel sounds. Extremities: no cyanosis, clubbing, rash, edema Neuro: alert & orientedx3, cranial nerves grossly intact. moves all 4 extremities w/o difficulty. Affect pleasant  RHC today suggestive of severe MR vs severe diastolic dysfunction  with large v-waves in PCWP tracing. I have reviewed echo. MR not visualized well but appears at least moderate, I think it is worthwhile to further evaluate with TEE. Will keep today and plan TEE in am if spot available. Hold diuretics today. Restart torsemide tomorrow.   [name], MD  8:36 AM

## 2021-04-27 NOTE — Progress Notes (Signed)
Patient refused CPAP HS tonight. Patient in  no distress at this time. 

## 2021-04-27 NOTE — Progress Notes (Signed)
Inpatient Diabetes Program Recommendations  AACE/ADA: New Consensus Statement on Inpatient Glycemic Control (2015)  Target Ranges:  Prepandial:   less than 140 mg/dL      Peak postprandial:   less than 180 mg/dL (1-2 hours)      Critically ill patients:  140 - 180 mg/dL   Lab Results  Component Value Date   GLUCAP 154 (H) 04/27/2021   HGBA1C 6.6 (H) 04/25/2021    Review of Glycemic Control Results for Dana [name], Dana [name] (MRN [id number]) as of 04/27/2021 12:11  Ref. Range 04/26/2021 16:46 04/26/2021 21:21 04/27/2021 06:00 04/27/2021 09:19 04/27/2021 11:27  Glucose-Capillary Latest Ref Range: 70 - 99 mg/dL 508 (HH) 467 (H) 231 (H) 148 (H) 154 (H)   Diabetes history: DM 2 Outpatient Diabetes medications:  Novolog 2-10 units tid with meals Levemir 50 units q AM Current orders for Inpatient glycemic control:  Levemir 35 units q HS Prednisone 40 mg daily Novolog resistant tid with meals and HS  Inpatient Diabetes Program Recommendations:    May consider adding Novolog 6 units tid with meals for meal coverage (hold if patient eats less than 50% or NPO).   Thanks,  Adah Perl, RN, BC-ADM Inpatient Diabetes Coordinator Pager [phone]  (8a-5p)

## 2021-04-27 NOTE — Progress Notes (Signed)
     Targeted Physical Exam  Exam Performed? [x] Yes  []  No   Date/Time of Assessment 04-27-21 10:00  Time unknown        Height 64 in   [] Not done   Estimated pounds over dry weight  0 lb   [] Not done  Estimated excess fluid volume 0 L [] Not done   Is subject experiencing Shortness of Breath? [] Yes [x] No [] Not done  Dyspnea VAS: (10 being the worst)  1       Is subject experiencing Orthopnea? [] Yes [x] No [] Not done   Is there evidence of Rales? [] Yes [x] No [] Not done  []  Rhonchi []  Crackles []  Wheezing []  Stridor       Is there presence of ascites? [] Yes [x] No [] Not done   Is there leg Edema? [] Yes [x] No [] Not done  Grade of edema          Is there sacral Edema? [] Yes [x] No [] Not done  Grade of edema          Is there Jugular Vein Distention (JVD)? [] Yes [x] No [] Not done       Provide measurement  cm  NYHA Classification: II      Has subject expressed presence of thirst?  [] Yes [x] No [] Not done       Rate of thirst level (10 being the worst):   COMMENTS:          Abelina Ketron NP-C  12:08 PM

## 2021-04-27 NOTE — Interval H&P Note (Signed)
History and Physical Interval Note:  04/27/2021 7:47 AM  Dana [name]  has presented today for surgery, with the diagnosis of chf.  The various methods of treatment have been discussed with the patient and family. After consideration of risks, benefits and other options for treatment, the patient has consented to  Procedure(s): RIGHT HEART CATH (N/A) as a surgical intervention.  The patient's history has been reviewed, patient examined, no change in status, stable for surgery.  I have reviewed the patient's chart and labs.  Questions were answered to the patient's satisfaction.     Garland Smouse

## 2021-04-27 NOTE — TOC Progression Note (Signed)
Transition of Care [facility]) - Progression Note    Patient Details  Name: Dana [name] MRN: [id number] Date of Birth: 04-Oct-1960  Transition of Care [facility]) CM/SW [place], [place] Phone Number: 04/27/2021, 11:25 AM  Clinical Narrative:    HF CSW spoke with Ms. Drury and family at bedside and completed a very brief SDOH with the patient who denied having any needs at this time. Patient reported they do have a PCP and they can get to the pharmacy to pick up their medications and she uses the [facility] in [place], [place]. [place] provided the patient with the social workers name and position and if anything changes to please reach out so that the CSW can provide support.  CSW will continue to follow throughout discharge.   Expected Discharge Plan: [place] Barriers to Discharge: Continued Medical Work up  Expected Discharge Plan and Services Expected Discharge Plan: [place] In-house Referral: Clinical Social Work   Post Acute Care Choice: [place] arrangements for the past 2 months: [place]: [place] ([place]) Date [place]: 04/25/21 Time [place]: [place] Representative spoke with at [place]: W[place] Men   Social Determinants of Health (SDOH) Interventions Food Insecurity Interventions: Intervention Not Indicated Financial Strain Interventions: Intervention Not Indicated Housing Interventions: Intervention Not Indicated Transportation Interventions: Intervention Not Indicated  Readmission Risk Interventions Readmission Risk Prevention Plan 10/06/2020 10/04/2020 09/03/2020  Transportation Screening Complete Complete Complete  PCP or Specialist Appt within 3-5 Days Complete - (No Data)  [place] or [place] - - Complete  Social Work Consult for [place] Planning/Counseling Complete - Complete  Palliative Care Screening Not  Applicable Not Applicable Not Applicable  Medication Review Press photographer) Complete Complete Complete  Some recent data might be hidden   Dray Dente, MSW, [place] Heart Failure Social Worker

## 2021-04-27 NOTE — Plan of Care (Signed)

## 2021-04-28 ENCOUNTER — Encounter (HOSPITAL_COMMUNITY)
Admission: AD | Disposition: A | Discharge status: Home / Self Care | Payer: Self-pay | Source: Ambulatory Visit | Attending: Internal Medicine

## 2021-04-28 ENCOUNTER — Inpatient Hospital Stay (HOSPITAL_COMMUNITY): Payer: HMO | Admitting: General Practice

## 2021-04-28 ENCOUNTER — Encounter (HOSPITAL_COMMUNITY): Payer: Self-pay | Admitting: Internal Medicine

## 2021-04-28 ENCOUNTER — Inpatient Hospital Stay (HOSPITAL_COMMUNITY): Payer: HMO

## 2021-04-28 DIAGNOSIS — I340 Nonrheumatic mitral (valve) insufficiency: Secondary | ICD-10-CM | POA: Diagnosis not present

## 2021-04-28 DIAGNOSIS — N179 Acute kidney failure, unspecified: Secondary | ICD-10-CM | POA: Diagnosis not present

## 2021-04-28 DIAGNOSIS — I5033 Acute on chronic diastolic (congestive) heart failure: Secondary | ICD-10-CM | POA: Diagnosis not present

## 2021-04-28 HISTORY — PX: TEE WITHOUT CARDIOVERSION: SHX5443

## 2021-04-28 LAB — MAGNESIUM: Magnesium: 2.2 mg/dL (ref 1.7–2.4)

## 2021-04-28 LAB — ECHO TEE
MV M vel: 5.88 m/s
MV Peak grad: 138.3 mmHg
Radius: 0.8 cm

## 2021-04-28 LAB — GLUCOSE, CAPILLARY
Glucose-Capillary: 187 mg/dL — ABNORMAL HIGH (ref 70–99)
Glucose-Capillary: 113 mg/dL — ABNORMAL HIGH (ref 70–99)
Glucose-Capillary: 118 mg/dL — ABNORMAL HIGH (ref 70–99)
Glucose-Capillary: 145 mg/dL — ABNORMAL HIGH (ref 70–99)

## 2021-04-28 LAB — BASIC METABOLIC PANEL
Anion gap: 13 (ref 5–15)
BUN: 96 mg/dL — ABNORMAL HIGH (ref 6–20)
CO2: 24 mmol/L (ref 22–32)
Calcium: 9 mg/dL (ref 8.9–10.3)
Chloride: 97 mmol/L — ABNORMAL LOW (ref 98–111)
Creatinine, Ser: 3.13 mg/dL — ABNORMAL HIGH (ref 0.44–1.00)
GFR, Estimated: 17 mL/min — ABNORMAL LOW (ref >60)
Glucose, Bld: 273 mg/dL — ABNORMAL HIGH (ref 70–99)
Potassium: 4.4 mmol/L (ref 3.5–5.1)
Sodium: 134 mmol/L — ABNORMAL LOW (ref 135–145)

## 2021-04-28 SURGERY — ECHOCARDIOGRAM, TRANSESOPHAGEAL
Anesthesia: Monitor Anesthesia Care

## 2021-04-28 MED ORDER — PROPOFOL 500 MG/50ML IV EMUL
INTRAVENOUS | Status: DC | PRN
  Administered 2021-04-28: 100 ug/kg/min via INTRAVENOUS

## 2021-04-28 MED ORDER — INSULIN DETEMIR 100 UNIT/ML ~~LOC~~ SOLN
10.0000 [IU] | Freq: Once | SUBCUTANEOUS | Status: DC
  Filled 2021-04-28: qty 0.1

## 2021-04-28 MED ORDER — LIDOCAINE HCL (CARDIAC) PF 100 MG/5ML IV SOSY
PREFILLED_SYRINGE | INTRAVENOUS | Status: DC | PRN
  Administered 2021-04-28: 30 mg via INTRAVENOUS
  Administered 2021-04-28: 20 mg via INTRAVENOUS

## 2021-04-28 MED ORDER — INSULIN DETEMIR 100 UNIT/ML ~~LOC~~ SOLN
40.0000 [IU] | Freq: Every day | SUBCUTANEOUS | Status: DC
  Administered 2021-04-28: 40 [IU] via SUBCUTANEOUS
  Filled 2021-04-28 (×2): qty 0.4

## 2021-04-28 MED ORDER — SODIUM CHLORIDE 0.9 % IV SOLN: INTRAVENOUS | Status: DC | PRN

## 2021-04-28 NOTE — Anesthesia Preprocedure Evaluation (Signed)
Anesthesia Evaluation  Patient identified by MRN, date of birth, ID band Patient awake    Reviewed: Allergy & Precautions, NPO status , Patient's Chart, lab work & pertinent test results  Airway Mallampati: II  TM Distance: >3 FB Neck ROM: Full    Dental no notable dental hx.    Pulmonary sleep apnea, Continuous Positive Airway Pressure Ventilation and Oxygen sleep apnea , COPD, former smoker,    Pulmonary exam normal breath sounds clear to auscultation       Cardiovascular hypertension, Pt. on home beta blockers and Pt. on medications + angina + CAD, + Past MI, + Cardiac Stents and +CHF  Normal cardiovascular exam+ Valvular Problems/Murmurs MR  Rhythm:Regular Rate:Normal  ECHO: Left ventricular ejection fraction, by estimation, is 65 to 70%. The left ventricle has normal function. The left ventricle has no regional wall motion abnormalities. Left ventricular diastolic parameters are indeterminate. Right ventricular systolic function is normal. The right ventricular size is normal. Tricuspid regurgitation signal is inadequate for assessing PA pressure. The mitral valve is normal in structure. Moderate mitral valve regurgitation. No evidence of mitral stenosis. The aortic valve was not well visualized. Aortic valve regurgitation is not visualized. The inferior vena cava is dilated in size with >50% respiratory variability, suggesting right atrial pressure of 8 mmHg.   Neuro/Psych PSYCHIATRIC DISORDERS Depression negative neurological ROS     GI/Hepatic Neg liver ROS, GERD  Medicated and Controlled,  Endo/Other  diabetes, Insulin Dependent  Renal/GU CRFRenal disease     Musculoskeletal Gout   Abdominal (+) + obese,   Peds  Hematology  (+) anemia ,   Anesthesia Other Findings mitral regurgitation  Reproductive/Obstetrics                             Anesthesia Physical Anesthesia Plan  ASA:  4  Anesthesia Plan: MAC   Post-op Pain Management:    Induction: Intravenous  PONV Risk Score and Plan: 2 and Propofol infusion and Treatment may vary due to age or medical condition  Airway Management Planned: Simple Face Mask  Additional Equipment:   Intra-op Plan:   Post-operative Plan:   Informed Consent: I have reviewed the patients History and Physical, chart, labs and discussed the procedure including the risks, benefits and alternatives for the proposed anesthesia with the patient or authorized representative who has indicated his/her understanding and acceptance.     Dental advisory given  Plan Discussed with: CRNA  Anesthesia Plan Comments:         Anesthesia Quick Evaluation

## 2021-04-28 NOTE — CV Procedure (Addendum)
    TRANSESOPHAGEAL ECHOCARDIOGRAM   NAME:  Dana [name]   MRN: [id number] DOB:  12-27-1960   ADMIT DATE: 04/24/2021  INDICATIONS:   PROCEDURE:   Informed consent was obtained prior to the procedure. The risks, benefits and alternatives for the procedure were discussed and the patient comprehended these risks.  Risks include, but are not limited to, cough, sore throat, vomiting, nausea, somnolence, esophageal and stomach trauma or perforation, bleeding, low blood pressure, aspiration, pneumonia, infection, trauma to the teeth and death.    After a procedural time-out, the patient was sedated by the anesthesia team.  The transesophageal probe was inserted in the esophagus and stomach without difficulty and multiple views were obtained.    COMPLICATIONS:    There were no immediate complications.  FINDINGS:  LEFT VENTRICLE: EF = 55-60%. There is focal akinesis/aneurysmal deformation at the base of the inferior wall.. + LVH  RIGHT VENTRICLE: Normal size and function.   LEFT ATRIUM: Moderately to severely dialted  LEFT ATRIAL APPENDAGE: No thrombus.   RIGHT ATRIUM: Normal  AORTIC VALVE:  Trileaflet.Trivial AI  MITRAL VALVE:    Normal. + annular dilation. Very mild restriction of P2. Severe (4+) central MR with flow reversal in pulmonary veins  TRICUSPID VALVE: Normal. Trivial TR  PULMONIC VALVE: Grossly normal. Trivial PR  INTERATRIAL SEPTUM: Small PFO  PERICARDIUM: No effusion  DESCENDING AORTA: Moderate plaque    Rylyn Zawistowski,MD 3:30 PM

## 2021-04-28 NOTE — Anesthesia Procedure Notes (Signed)
Procedure Name: MAC Date/Time: 04/28/2021 2:55 PM Performed by: Eligha Bridegroom, CRNA Pre-anesthesia Checklist: Emergency Drugs available, Suction available, Patient being monitored, Timeout performed and Patient identified Oxygen Delivery Method: Nasal cannula Preoxygenation: Pre-oxygenation with 100% oxygen Induction Type: IV induction

## 2021-04-28 NOTE — Interval H&P Note (Signed)
History and Physical Interval Note:  04/28/2021 2:49 PM  Dana [name]  has presented today for surgery, with the diagnosis of mitral regurgitation.  The various methods of treatment have been discussed with the patient and family. After consideration of risks, benefits and other options for treatment, the patient has consented to  Procedure(s): TRANSESOPHAGEAL ECHOCARDIOGRAM (TEE) (N/A) as a surgical intervention.  The patient's history has been reviewed, patient examined, no change in status, stable for surgery.  I have reviewed the patient's chart and labs.  Questions were answered to the patient's satisfaction.     Mckenzie Toruno

## 2021-04-28 NOTE — Progress Notes (Addendum)
Advanced Heart Failure Rounding Note  PCP-Cardiologist: [name], MD   Subjective:    Admitted with volume overload. 10/10 - Enrolled in FASTR trial.   Creatinine continues to rise 2.2>2.3>2.4>2.9>3.13  No dyspnea. Denies recurrent CP. Feels anxious about today's procedure.  Weight down 15lb from admit. Diuretics now on hold.  TEE planned for this afternoon to further evaluate MR  RHC 10/13: Findings: RA = 8 RV = 59/9 PA = 61/17 (35) PCW = mean 23 (v = 49) -> performed multiple wedges Fick cardiac output/index = PVR = 1.6 WU FA sat = 98% PA sat = 69%, 70% PaPi = 5.5   Assessment: 1. Mild to moderately elevated filling pressures with very large v-waves in PCWP tracing suggestive of severe diastolic dysfunction vs severe MR    Objective:   Weight Range: 101.1 kg Body mass index is 37.09 kg/m.   Vital Signs:   Temp:  [98.1 F (36.7 C)-98.6 F (37 C)] 98.6 F (37 C) (10/14 0715) Pulse Rate:  [67-91] 80 (10/14 0715) Resp:  [10-21] 16 (10/14 0715) BP: (104-128)/(40-85) 112/47 (10/14 0715) SpO2:  [94 %-100 %] 97 % (10/14 0715) Weight:  [101.1 kg] 101.1 kg (10/14 0509)    Weight change: Filed Weights   04/26/21 0539 04/27/21 0440 04/28/21 0509  Weight: 102.9 kg 102.1 kg 101.1 kg    Intake/Output:   Intake/Output Summary (Last 24 hours) at 04/28/2021 0719 Last data filed at 04/28/2021 0551 Gross per 24 hour  Intake 802.64 ml  Output 1150 ml  Net -347.36 ml      Physical Exam   General:  Lying comfortably in bed.  HEENT: normal Neck: JVD difficult to assess d/t neck size. Carotids 2+ bilat; no bruits. No lymphadenopathy or thryomegaly appreciated. Cor: PMI nondisplaced. Regular rate & rhythm. No rubs, gallops or murmurs. Lungs: clear Abdomen: soft, obese, nontender, nondistended. No hepatosplenomegaly. No bruits or masses. Good bowel sounds. Extremities: no cyanosis, clubbing, rash, edema Neuro: alert & orientedx3, cranial nerves grossly  intact. moves all 4 extremities w/o difficulty. Affect pleasant    Telemetry   NSR 70s-80s, one 20 beat run narrow complex tachycardia around 6:45 am  Labs    CBC Recent Labs    04/26/21 2114 04/27/21 0807 04/27/21 1452  WBC 12.8*  --  11.3*  HGB 9.5* 9.5*  9.5* 9.1*  HCT 29.8* 28.0*  28.0* 28.7*  MCV 86.1  --  86.4  PLT 340  --  827   Basic Metabolic Panel Recent Labs    04/27/21 1452 04/28/21 0033  NA 137 134*  K 4.2 4.4  CL 98 97*  CO2 26 24  GLUCOSE 144* 273*  BUN 87* 96*  CREATININE 2.92* 3.13*  CALCIUM 9.1 9.0  MG 2.2 2.2   Liver Function Tests Recent Labs    04/26/21 2114 04/27/21 1452  AST 16 20  ALT 14 12  ALKPHOS 100 92  BILITOT 1.1 0.6  PROT 7.8 7.5  ALBUMIN 3.5 3.5   No results for input(s): LIPASE, AMYLASE in the last 72 hours. Cardiac Enzymes No results for input(s): CKTOTAL, CKMB, CKMBINDEX, TROPONINI in the last 72 hours.  BNP: BNP (last 3 results) Recent Labs    08/29/20 0636 09/29/20 0724 04/12/21 1130  BNP 87.6 268.7* 353.3*    ProBNP (last 3 results) Recent Labs    10/12/20 1356  PROBNP 3,407*     D-Dimer No results for input(s): DDIMER in the last 72 hours. Hemoglobin A1C No results for input(s):  HGBA1C in the last 72 hours.  Fasting Lipid Panel No results for input(s): CHOL, HDL, LDLCALC, TRIG, CHOLHDL, LDLDIRECT in the last 72 hours. Thyroid Function Tests No results for input(s): TSH, T4TOTAL, T3FREE, THYROIDAB in the last 72 hours.  Invalid input(s): FREET3   Other results:   Imaging    CARDIAC CATHETERIZATION  Result Date: 04/27/2021 Findings: RA = 8 RV = 59/9 PA = 61/17 (35) PCW = mean 23 (v = 49) -> performed multiple wedges Fick cardiac output/index = PVR = 1.6 WU FA sat = 98% PA sat = 69%, 70% PaPi = 5.5 Assessment: 1. Mild to moderately elevated filling pressures with very large v-waves in PCWP tracing suggestive of severe diastolic dysfunction (has not had much MR on echo)      Medications:     Scheduled Medications:  allopurinol  100 mg Oral Daily   aspirin EC  81 mg Oral Daily   carvedilol  12.5 mg Oral BID WC   Chlorhexidine Gluconate Cloth  6 each Topical Daily   clopidogrel  75 mg Oral QHS   enoxaparin (LOVENOX) injection  40 mg Subcutaneous Q24H   ezetimibe  10 mg Oral Daily   ferrous sulfate  325 mg Oral BID WC   hydrALAZINE  25 mg Oral TID   insulin aspart  0-20 Units Subcutaneous TID WC   insulin aspart  0-5 Units Subcutaneous QHS   insulin aspart  6 Units Subcutaneous TID WC   insulin detemir  35 Units Subcutaneous QHS   isosorbide mononitrate  60 mg Oral Daily   pantoprazole  40 mg Oral Daily   predniSONE  40 mg Oral Q breakfast   sodium chloride flush  3 mL Intravenous Q12H    Infusions:  sodium chloride     sodium chloride 20 mL/hr at 04/28/21 0551   promethazine (PHENERGAN) injection (IM or IVPB) Stopped (04/26/21 2339)    PRN Medications: sodium chloride, acetaminophen, HYDROcodone-acetaminophen, LORazepam, nitroGLYCERIN, ondansetron (ZOFRAN) IV, promethazine (PHENERGAN) injection (IM or IVPB), sodium chloride flush    Patient Profile   Dana [name] is 60 y.o. female with history of diastolic heart failure, CAD, DES RCA/LAD 2018, DMI, htn, hyperlipidemia, PAD, and OSA on CPAP, PAF s/p ablation, PAD s/p L great toe and 4th toe amputation.  Admitted with volume overload. Enrolled in FASTR trial.   Assessment/Plan   1. Acute on Chronic Diastolic Heart Failure - Echo 08/2020 EF 55-60%  - Echo (3/22): EF 55-60%, Grade II DD, normal RV - Echo (6/22): EF 60%, mod MR/TR (per chart review at [facility]). - now in a/c CHF w/ NYHA Class IIIb symptoms and persistent, marked volume overload, despite outpatient diuretic titration.  CardioMEMs at 16 (goal =10). ReDs clip also elevated at 48% on admit. Course c/b AKI on Stage IV CKD w recent bump in SCr from baseline of 1.7>>2.4>>3.13 - 10/10 given 120 mg x 1.  Started on FASTR Trial.  - On  10/11 given metolazone. Brisk diuresis noted. Volume status improved. Wt down 12 lb.  - Stopped Reprieve/FASTR on 10/12.  - Diuretics on hold. Wt. Down total of 15 lb from admit - Continue carvedilol 12.5 mg bid. - Continue hydralazine 25 mg tid + Imdur 30 mg daily. - No SGLT2i with T1DM. - RHC with mild to moderately elevated filling pressures with very large v-waves in PCWP tracing suggestive of severe diastolic dysfunction versus severe MR. TEE today to better evaluate.   2. CAD - Known CAD with prior stents to mid LAD  and pRCA.  - NSTEMI (2/22). Cath deferred due to AKI. Treated medically.  - NSTEMI (7/22)-->S/p PCI/DES to mid circ, balloon angioplasty to OM1  - Mild tightness but likely 2/2 fluid overload. EKG nonischemic.  HS Trop 55>58>>66>>62. Stable, no recurrent pain overnight. - Continue ASA + Plavix for a minimum of 12 months. - Lipids under good control w/ Repatha + Zetia (LDL 66 7/22). Followed by Dr. Debara Pickett.    3. AKI CKD IV  - prior h/o CIN following LHC - Followed by Nephrology in [place] - SCr baseline 1.5  - Creatinine 2.3>2.4 >2.9>3.1, continue to follow closely - BP okay   4. OSA -  CPAP qhs.    5. DM1 - Hgb A1c 6.6. - continue home insulin regimen    6. Chronic Anemia - Baseline Hgb 8-9 range, likely 2/2 renal disease. - hgb stable at 9.1 - Iron stores WNL  7. Acute Gout Flare - Uric Acid 9.3.  - treated with 3 day prednisone burst   Length of Stay: 4  FINCH, LINDSAY N, PA-C  04/28/2021, 7:19 AM  Advanced Heart Failure Team Pager [phone] (M-F; 7a - 5p)  Please contact [facility] for night-coverage after hours (5p -7a ) and weekends on [email or web address]  Patient seen and examined with the above-signed Advanced Practice Provider and/or Housestaff. I personally reviewed laboratory data, imaging studies and relevant notes. I independently examined the patient and formulated the important aspects of the plan. I have edited the note to reflect any of my  changes or salient points. I have personally discussed the plan with the patient and/or family.   Volume status looks good on exam but filling pressures still eelvated on cath with large v-waves in PCWP tracing.   Denies orthopnea or PND.   General:  Sitting up in bed. No resp difficulty HEENT: normal Neck: supple. Mildly elevated JVP. Carotids 2+ bilat; no bruits. No lymphadenopathy or thryomegaly appreciated. Cor: PMI nondisplaced. Regular rate & rhythm. No rubs, gallops or murmurs. Lungs: clear Abdomen: obese soft, nontender, nondistended. No hepatosplenomegaly. No bruits or masses. Good bowel sounds. Extremities: no cyanosis, clubbing, rash, edema Neuro: alert & orientedx3, cranial nerves grossly intact. moves all 4 extremities w/o difficulty. Affect pleasant  SCr remains elevated due to cardio-renal syndrome. Plan TEE to better evaluate MR (was moderate on 2D echo). Suspect v-waves combination of MR and severe diastolic dysfunction. Continue to hold diuretics one more day.   [name], MD  9:02 AM

## 2021-04-28 NOTE — Progress Notes (Signed)
  Echocardiogram 2D Echocardiogram has been performed.  Dana [name] 04/28/2021, 3:29 PM

## 2021-04-28 NOTE — H&P (View-Only) (Signed)
Advanced Heart Failure Rounding Note  PCP-Cardiologist: [name], MD   Subjective:    Admitted with volume overload. 10/10 - Enrolled in FASTR trial.   Creatinine continues to rise 2.2>2.3>2.4>2.9>3.13  No dyspnea. Denies recurrent CP. Feels anxious about today's procedure.  Weight down 15lb from admit. Diuretics now on hold.  TEE planned for this afternoon to further evaluate MR  RHC 10/13: Findings: RA = 8 RV = 59/9 PA = 61/17 (35) PCW = mean 23 (v = 49) -> performed multiple wedges Fick cardiac output/index = PVR = 1.6 WU FA sat = 98% PA sat = 69%, 70% PaPi = 5.5   Assessment: 1. Mild to moderately elevated filling pressures with very large v-waves in PCWP tracing suggestive of severe diastolic dysfunction vs severe MR    Objective:   Weight Range: 101.1 kg Body mass index is 37.09 kg/m.   Vital Signs:   Temp:  [98.1 F (36.7 C)-98.6 F (37 C)] 98.6 F (37 C) (10/14 0715) Pulse Rate:  [67-91] 80 (10/14 0715) Resp:  [10-21] 16 (10/14 0715) BP: (104-128)/(40-85) 112/47 (10/14 0715) SpO2:  [94 %-100 %] 97 % (10/14 0715) Weight:  [101.1 kg] 101.1 kg (10/14 0509)    Weight change: Filed Weights   04/26/21 0539 04/27/21 0440 04/28/21 0509  Weight: 102.9 kg 102.1 kg 101.1 kg    Intake/Output:   Intake/Output Summary (Last 24 hours) at 04/28/2021 0719 Last data filed at 04/28/2021 0551 Gross per 24 hour  Intake 802.64 ml  Output 1150 ml  Net -347.36 ml      Physical Exam   General:  Lying comfortably in bed.  HEENT: normal Neck: JVD difficult to assess d/t neck size. Carotids 2+ bilat; no bruits. No lymphadenopathy or thryomegaly appreciated. Cor: PMI nondisplaced. Regular rate & rhythm. No rubs, gallops or murmurs. Lungs: clear Abdomen: soft, obese, nontender, nondistended. No hepatosplenomegaly. No bruits or masses. Good bowel sounds. Extremities: no cyanosis, clubbing, rash, edema Neuro: alert & orientedx3, cranial nerves grossly  intact. moves all 4 extremities w/o difficulty. Affect pleasant    Telemetry   NSR 70s-80s, one 20 beat run narrow complex tachycardia around 6:45 am  Labs    CBC Recent Labs    04/26/21 2114 04/27/21 0807 04/27/21 1452  WBC 12.8*  --  11.3*  HGB 9.5* 9.5*  9.5* 9.1*  HCT 29.8* 28.0*  28.0* 28.7*  MCV 86.1  --  86.4  PLT 340  --  419   Basic Metabolic Panel Recent Labs    04/27/21 1452 04/28/21 0033  NA 137 134*  K 4.2 4.4  CL 98 97*  CO2 26 24  GLUCOSE 144* 273*  BUN 87* 96*  CREATININE 2.92* 3.13*  CALCIUM 9.1 9.0  MG 2.2 2.2   Liver Function Tests Recent Labs    04/26/21 2114 04/27/21 1452  AST 16 20  ALT 14 12  ALKPHOS 100 92  BILITOT 1.1 0.6  PROT 7.8 7.5  ALBUMIN 3.5 3.5   No results for input(s): LIPASE, AMYLASE in the last 72 hours. Cardiac Enzymes No results for input(s): CKTOTAL, CKMB, CKMBINDEX, TROPONINI in the last 72 hours.  BNP: BNP (last 3 results) Recent Labs    08/29/20 0636 09/29/20 0724 04/12/21 1130  BNP 87.6 268.7* 353.3*    ProBNP (last 3 results) Recent Labs    10/12/20 1356  PROBNP 3,407*     D-Dimer No results for input(s): DDIMER in the last 72 hours. Hemoglobin A1C No results for input(s):  HGBA1C in the last 72 hours.  Fasting Lipid Panel No results for input(s): CHOL, HDL, LDLCALC, TRIG, CHOLHDL, LDLDIRECT in the last 72 hours. Thyroid Function Tests No results for input(s): TSH, T4TOTAL, T3FREE, THYROIDAB in the last 72 hours.  Invalid input(s): FREET3   Other results:   Imaging    CARDIAC CATHETERIZATION  Result Date: 04/27/2021 Findings: RA = 8 RV = 59/9 PA = 61/17 (35) PCW = mean 23 (v = 49) -> performed multiple wedges Fick cardiac output/index = PVR = 1.6 WU FA sat = 98% PA sat = 69%, 70% PaPi = 5.5 Assessment: 1. Mild to moderately elevated filling pressures with very large v-waves in PCWP tracing suggestive of severe diastolic dysfunction (has not had much MR on echo)      Medications:     Scheduled Medications:  allopurinol  100 mg Oral Daily   aspirin EC  81 mg Oral Daily   carvedilol  12.5 mg Oral BID WC   Chlorhexidine Gluconate Cloth  6 each Topical Daily   clopidogrel  75 mg Oral QHS   enoxaparin (LOVENOX) injection  40 mg Subcutaneous Q24H   ezetimibe  10 mg Oral Daily   ferrous sulfate  325 mg Oral BID WC   hydrALAZINE  25 mg Oral TID   insulin aspart  0-20 Units Subcutaneous TID WC   insulin aspart  0-5 Units Subcutaneous QHS   insulin aspart  6 Units Subcutaneous TID WC   insulin detemir  35 Units Subcutaneous QHS   isosorbide mononitrate  60 mg Oral Daily   pantoprazole  40 mg Oral Daily   predniSONE  40 mg Oral Q breakfast   sodium chloride flush  3 mL Intravenous Q12H    Infusions:  sodium chloride     sodium chloride 20 mL/hr at 04/28/21 0551   promethazine (PHENERGAN) injection (IM or IVPB) Stopped (04/26/21 2339)    PRN Medications: sodium chloride, acetaminophen, HYDROcodone-acetaminophen, LORazepam, nitroGLYCERIN, ondansetron (ZOFRAN) IV, promethazine (PHENERGAN) injection (IM or IVPB), sodium chloride flush    Patient Profile   Dana [name] is 60 y.o. female with history of diastolic heart failure, CAD, DES RCA/LAD 2018, DMI, htn, hyperlipidemia, PAD, and OSA on CPAP, PAF s/p ablation, PAD s/p L great toe and 4th toe amputation.  Admitted with volume overload. Enrolled in FASTR trial.   Assessment/Plan   1. Acute on Chronic Diastolic Heart Failure - Echo 08/2020 EF 55-60%  - Echo (3/22): EF 55-60%, Grade II DD, normal RV - Echo (6/22): EF 60%, mod MR/TR (per chart review at [place]). - now in a/c CHF w/ NYHA Class IIIb symptoms and persistent, marked volume overload, despite outpatient diuretic titration.  CardioMEMs at 16 (goal =10). ReDs clip also elevated at 48% on admit. Course c/b AKI on Stage IV CKD w recent bump in SCr from baseline of 1.7>>2.4>>3.13 - 10/10 given 120 mg x 1.  Started on FASTR Trial.  - On  10/11 given metolazone. Brisk diuresis noted. Volume status improved. Wt down 12 lb.  - Stopped Reprieve/FASTR on 10/12.  - Diuretics on hold. Wt. Down total of 15 lb from admit - Continue carvedilol 12.5 mg bid. - Continue hydralazine 25 mg tid + Imdur 30 mg daily. - No SGLT2i with T1DM. - RHC with mild to moderately elevated filling pressures with very large v-waves in PCWP tracing suggestive of severe diastolic dysfunction versus severe MR. TEE today to better evaluate.   2. CAD - Known CAD with prior stents to mid LAD  and pRCA.  - NSTEMI (2/22). Cath deferred due to AKI. Treated medically.  - NSTEMI (7/22)-->S/p PCI/DES to mid circ, balloon angioplasty to OM1  - Mild tightness but likely 2/2 fluid overload. EKG nonischemic.  HS Trop 55>58>>66>>62. Stable, no recurrent pain overnight. - Continue ASA + Plavix for a minimum of 12 months. - Lipids under good control w/ Repatha + Zetia (LDL 66 7/22). Followed by Dr. Debara Pickett.    3. AKI CKD IV  - prior h/o CIN following [facility] - Followed by Nephrology in [place] - SCr baseline 1.5  - Creatinine 2.3>2.4 >2.9>3.1, continue to follow closely - BP okay   4. OSA -  CPAP qhs.    5. DM1 - Hgb A1c 6.6. - continue home insulin regimen    6. Chronic Anemia - Baseline Hgb 8-9 range, likely 2/2 renal disease. - hgb stable at 9.1 - Iron stores WNL  7. Acute Gout Flare - Uric Acid 9.3.  - treated with 3 day prednisone burst   Length of Stay: 4  FINCH, LINDSAY N, PA-C  04/28/2021, 7:19 AM  Advanced Heart Failure Team Pager [phone] (M-F; 7a - 5p)  Please contact [facility] for night-coverage after hours (5p -7a ) and weekends on [email or web address]  Patient seen and examined with the above-signed Advanced Practice Provider and/or Housestaff. I personally reviewed laboratory data, imaging studies and relevant notes. I independently examined the patient and formulated the important aspects of the plan. I have edited the note to reflect any of my  changes or salient points. I have personally discussed the plan with the patient and/or family.   Volume status looks good on exam but filling pressures still eelvated on cath with large v-waves in PCWP tracing.   Denies orthopnea or PND.   General:  Sitting up in bed. No resp difficulty HEENT: normal Neck: supple. Mildly elevated JVP. Carotids 2+ bilat; no bruits. No lymphadenopathy or thryomegaly appreciated. Cor: PMI nondisplaced. Regular rate & rhythm. No rubs, gallops or murmurs. Lungs: clear Abdomen: obese soft, nontender, nondistended. No hepatosplenomegaly. No bruits or masses. Good bowel sounds. Extremities: no cyanosis, clubbing, rash, edema Neuro: alert & orientedx3, cranial nerves grossly intact. moves all 4 extremities w/o difficulty. Affect pleasant  SCr remains elevated due to cardio-renal syndrome. Plan TEE to better evaluate MR (was moderate on 2D echo). Suspect v-waves combination of MR and severe diastolic dysfunction. Continue to hold diuretics one more day.   [name], MD  9:02 AM

## 2021-04-28 NOTE — Research (Signed)
72 Hour Audiogram

## 2021-04-28 NOTE — Transfer of Care (Signed)
Immediate Anesthesia Transfer of Care Note  Patient: EMALYNN CLEWIS  Procedure(s) Performed: TRANSESOPHAGEAL ECHOCARDIOGRAM (TEE)  Patient Location: PACU  Anesthesia Type:MAC  Level of Consciousness: awake, alert  and oriented  Airway & Oxygen Therapy: Patient Spontanous Breathing  Post-op Assessment: Report given to RN and Post -op Vital signs reviewed and stable  Post vital signs: Reviewed and stable  Last Vitals:  Vitals Value Taken Time  BP 116/35 04/28/21 1525  Temp    Pulse 73 04/28/21 1526  Resp 16 04/28/21 1526  SpO2 95 % 04/28/21 1526  Vitals shown include unvalidated device data.  Last Pain:  Vitals:   04/28/21 1407  TempSrc: Temporal  PainSc: 6       Patients Stated Pain Goal: 0 (29/51/88 4166)  Complications: No notable events documented.

## 2021-04-29 ENCOUNTER — Other Ambulatory Visit: Payer: Self-pay | Admitting: Physician Assistant

## 2021-04-29 ENCOUNTER — Encounter: Payer: Self-pay | Admitting: *Deleted

## 2021-04-29 DIAGNOSIS — I5033 Acute on chronic diastolic (congestive) heart failure: Secondary | ICD-10-CM

## 2021-04-29 DIAGNOSIS — Z006 Encounter for examination for normal comparison and control in clinical research program: Secondary | ICD-10-CM

## 2021-04-29 LAB — CBC
HCT: 26.6 % — ABNORMAL LOW (ref 36.0–46.0)
Hemoglobin: 8.3 g/dL — ABNORMAL LOW (ref 12.0–15.0)
MCH: 27.2 pg (ref 26.0–34.0)
MCHC: 31.2 g/dL (ref 30.0–36.0)
MCV: 87.2 fL (ref 80.0–100.0)
Platelets: 278 10*3/uL (ref 150–400)
RBC: 3.05 MIL/uL — ABNORMAL LOW (ref 3.87–5.11)
RDW: 15.3 % (ref 11.5–15.5)
WBC: 12 10*3/uL — ABNORMAL HIGH (ref 4.0–10.5)
nRBC: 0 % (ref 0.0–0.2)

## 2021-04-29 LAB — COMPREHENSIVE METABOLIC PANEL
ALT: 13 U/L (ref 0–44)
AST: 15 U/L (ref 15–41)
Albumin: 3.1 g/dL — ABNORMAL LOW (ref 3.5–5.0)
Alkaline Phosphatase: 74 U/L (ref 38–126)
Anion gap: 9 (ref 5–15)
BUN: 97 mg/dL — ABNORMAL HIGH (ref 6–20)
CO2: 27 mmol/L (ref 22–32)
Calcium: 9 mg/dL (ref 8.9–10.3)
Chloride: 102 mmol/L (ref 98–111)
Creatinine, Ser: 2.94 mg/dL — ABNORMAL HIGH (ref 0.44–1.00)
GFR, Estimated: 18 mL/min — ABNORMAL LOW (ref >60)
Glucose, Bld: 160 mg/dL — ABNORMAL HIGH (ref 70–99)
Potassium: 4 mmol/L (ref 3.5–5.1)
Sodium: 138 mmol/L (ref 135–145)
Total Bilirubin: 0.7 mg/dL (ref 0.3–1.2)
Total Protein: 6.7 g/dL (ref 6.5–8.1)

## 2021-04-29 LAB — GLUCOSE, CAPILLARY
Glucose-Capillary: 60 mg/dL — ABNORMAL LOW (ref 70–99)
Glucose-Capillary: 72 mg/dL (ref 70–99)
Glucose-Capillary: 104 mg/dL — ABNORMAL HIGH (ref 70–99)

## 2021-04-29 LAB — MAGNESIUM: Magnesium: 2.3 mg/dL (ref 1.7–2.4)

## 2021-04-29 MED ORDER — POTASSIUM CHLORIDE CRYS ER 20 MEQ PO TBCR
20.0000 meq | EXTENDED_RELEASE_TABLET | Freq: Every day | ORAL | Status: DC
  Administered 2021-04-29: 20 meq via ORAL
  Filled 2021-04-29: qty 1

## 2021-04-29 MED ORDER — ISOSORBIDE MONONITRATE ER 30 MG PO TB24: 30.0000 mg | ORAL_TABLET | Freq: Every day | ORAL | 3 refills | Status: DC

## 2021-04-29 MED ORDER — INSULIN ASPART 100 UNIT/ML IJ SOLN: 0.0000 [IU] | Freq: Three times a day (TID) | INTRAMUSCULAR | Status: DC

## 2021-04-29 MED ORDER — HYDRALAZINE HCL 25 MG PO TABS: 25.0000 mg | ORAL_TABLET | Freq: Three times a day (TID) | ORAL | 0 refills | Status: DC

## 2021-04-29 MED ORDER — TORSEMIDE 20 MG PO TABS
50.0000 mg | ORAL_TABLET | Freq: Every day | ORAL | Status: DC
  Administered 2021-04-29: 50 mg via ORAL
  Filled 2021-04-29: qty 3

## 2021-04-29 MED ORDER — TORSEMIDE 20 MG PO TABS: 50.0000 mg | ORAL_TABLET | Freq: Every day | ORAL | 11 refills | Status: DC

## 2021-04-29 MED ORDER — ASPIRIN 81 MG PO TBEC: 81.0000 mg | DELAYED_RELEASE_TABLET | Freq: Every day | ORAL | 3 refills | Status: DC

## 2021-04-29 MED ORDER — POTASSIUM CHLORIDE CRYS ER 20 MEQ PO TBCR
20.0000 meq | EXTENDED_RELEASE_TABLET | Freq: Every day | ORAL | 3 refills | Status: DC
Start: 2021-04-30 — End: 2021-09-12

## 2021-04-29 NOTE — Plan of Care (Signed)
  Problem: Education: Goal: Knowledge of General Education information will improve Description: Including pain rating scale, medication(s)/side effects and non-pharmacologic comfort measures Outcome: Adequate for Discharge   Problem: Health Behavior/Discharge Planning: Goal: Ability to manage health-related needs will improve Outcome: Adequate for Discharge   Problem: Clinical Measurements: Goal: Ability to maintain clinical measurements within normal limits will improve Outcome: Adequate for Discharge Goal: Will remain free from infection Outcome: Adequate for Discharge Goal: Diagnostic test results will improve Outcome: Adequate for Discharge Goal: Respiratory complications will improve Outcome: Adequate for Discharge Goal: Cardiovascular complication will be avoided Outcome: Adequate for Discharge   Problem: Activity: Goal: Risk for activity intolerance will decrease Outcome: Adequate for Discharge   Problem: Nutrition: Goal: Adequate nutrition will be maintained Outcome: Adequate for Discharge   Problem: Coping: Goal: Level of anxiety will decrease Outcome: Adequate for Discharge   Problem: Elimination: Goal: Will not experience complications related to bowel motility Outcome: Adequate for Discharge Goal: Will not experience complications related to urinary retention Outcome: Adequate for Discharge   Problem: Pain Managment: Goal: General experience of comfort will improve Outcome: Adequate for Discharge   Problem: Elimination: Goal: Will not experience complications related to urinary retention Outcome: Adequate for Discharge   Problem: Safety: Goal: Ability to remain free from injury will improve Outcome: Adequate for Discharge   Problem: Skin Integrity: Goal: Risk for impaired skin integrity will decrease Outcome: Adequate for Discharge   Problem: Education: Goal: Ability to demonstrate management of disease process will improve Outcome: Adequate for  Discharge Goal: Ability to verbalize understanding of medication therapies will improve Outcome: Adequate for Discharge Goal: Individualized Educational Video(s) Outcome: Adequate for Discharge

## 2021-04-29 NOTE — Research (Signed)
24 hour urinalysis - [facility]

## 2021-04-29 NOTE — Progress Notes (Signed)
BMP

## 2021-04-29 NOTE — Research (Signed)
AUDIOGRAM results from 04-29-2021 at Discharge.

## 2021-04-29 NOTE — Progress Notes (Signed)
Patient ID: NDIDI NESBY, female   DOB: April 04, 1961, 60 y.o.   MRN: [id number]     Advanced Heart Failure Rounding Note  PCP-Cardiologist: [name], MD   Subjective:    Admitted with volume overload. 10/10 - Enrolled in FASTR trial.   Creatinine has stabilized 2.2>2.3>2.4>2.9>3.13>2.94.   Doing ok walking around room.  Wants to go home today.   TEE: E55-60%, basal inferior akinesis, severe central MR.   [place] 10/13: Findings: RA = 8 RV = 59/9 PA = 61/17 (35) PCW = mean 23 (v = 49) -> performed multiple wedges Fick cardiac output/index = PVR = 1.6 WU FA sat = 98% PA sat = 69%, 70% PaPi = 5.5   Assessment: 1. Mild to moderately elevated filling pressures with very large v-waves in PCWP tracing suggestive of severe diastolic dysfunction vs severe MR    Objective:   Weight Range: 98.9 kg Body mass index is 36.28 kg/m.   Vital Signs:   Temp:  [97.2 F (36.2 C)-98.6 F (37 C)] 98.2 F (36.8 C) (10/15 0736) Pulse Rate:  [69-74] 71 (10/15 0326) Resp:  [11-20] 15 (10/15 0326) BP: (97-155)/(24-56) 109/41 (10/15 0326) SpO2:  [96 %-100 %] 96 % (10/15 0736) Weight:  [98.9 kg] 98.9 kg (10/15 0532)    Weight change: Filed Weights   04/27/21 0440 04/28/21 0509 04/29/21 0532  Weight: 102.1 kg 101.1 kg 98.9 kg    Intake/Output:   Intake/Output Summary (Last 24 hours) at 04/29/2021 0857 Last data filed at 04/29/2021 0856 Gross per 24 hour  Intake 945 ml  Output 1000 ml  Net -55 ml      Physical Exam   General: NAD Neck: JVP 8 cm, no thyromegaly or thyroid nodule.  Lungs: Clear to auscultation bilaterally with normal respiratory effort. CV: Nondisplaced PMI.  Heart regular S1/S2, no S3/S4, 2/6 HSM apex.  Trace ankle edema.  Abdomen: Soft, nontender, no hepatosplenomegaly, no distention.  Skin: Intact without lesions or rashes.  Neurologic: Alert and oriented x 3.  Psych: Normal affect. Extremities: No clubbing or cyanosis.  HEENT: Normal.     Telemetry   NSR 90s, personally reviewed.   Labs    CBC Recent Labs    04/27/21 1452 04/29/21 0131  WBC 11.3* 12.0*  HGB 9.1* 8.3*  HCT 28.7* 26.6*  MCV 86.4 87.2  PLT 326 789   Basic Metabolic Panel Recent Labs    04/28/21 0033 04/29/21 0131  NA 134* 138  K 4.4 4.0  CL 97* 102  CO2 24 27  GLUCOSE 273* 160*  BUN 96* 97*  CREATININE 3.13* 2.94*  CALCIUM 9.0 9.0  MG 2.2 2.3   Liver Function Tests Recent Labs    04/27/21 1452 04/29/21 0131  AST 20 15  ALT 12 13  ALKPHOS 92 74  BILITOT 0.6 0.7  PROT 7.5 6.7  ALBUMIN 3.5 3.1*   No results for input(s): LIPASE, AMYLASE in the last 72 hours. Cardiac Enzymes No results for input(s): CKTOTAL, CKMB, CKMBINDEX, TROPONINI in the last 72 hours.  BNP: BNP (last 3 results) Recent Labs    08/29/20 0636 09/29/20 0724 04/12/21 1130  BNP 87.6 268.7* 353.3*    ProBNP (last 3 results) Recent Labs    10/12/20 1356  PROBNP 3,407*     D-Dimer No results for input(s): DDIMER in the last 72 hours. Hemoglobin A1C No results for input(s): HGBA1C in the last 72 hours.  Fasting Lipid Panel No results for input(s): CHOL, HDL, LDLCALC, TRIG, CHOLHDL, LDLDIRECT  in the last 72 hours. Thyroid Function Tests No results for input(s): TSH, T4TOTAL, T3FREE, THYROIDAB in the last 72 hours.  Invalid input(s): FREET3   Other results:   Imaging    ECHO TEE  Result Date: 04/28/2021    TRANSESOPHOGEAL ECHO REPORT   Patient Name:   CASHAE WEICH Date of Exam: 04/28/2021 Medical Rec #:  [id number]      Height:       65.0 in Accession #:    [id number]     Weight:       222.9 lb Date of Birth:  07/22/1960      BSA:          2.071 m Patient Age:    35 years       BP:           141/57 mmHg Patient Gender: F              HR:           69 bpm. Exam Location:  Inpatient Procedure: 2D Echo, Cardiac Doppler, Color Doppler and 3D Echo Indications:     Mitral regurgitation  History:         Patient has prior history of  Echocardiogram examinations, most                  recent 04/25/2021. CHF, Previous Myocardial Infarction; Risk                  Factors:Hypertension, Diabetes and Dyslipidemia.  Sonographer:     Clayton Lefort RDCS (AE) Referring Phys:  [place] Diagnosing Phys: [name] MD PROCEDURE: The transesophogeal probe was passed without difficulty through the esophogus of the patient. Sedation performed by different physician. The patient was monitored while under deep sedation. Anesthestetic sedation was provided intravenously by Anesthesiology: 242.64mg  of Propofol, 50mg  of Lidocaine. Image quality was good. The patient's vital signs; including heart rate, blood pressure, and oxygen saturation; remained stable throughout the procedure. The patient developed no complications during the procedure. IMPRESSIONS  1. Left ventricular ejection fraction, by estimation, is 60 to 65%. The left ventricle has normal function. The left ventricle demonstrates regional wall motion abnormalities (see scoring diagram/findings for description). There is moderate concentric left ventricular hypertrophy. There is akinesis of the left ventricular, basal inferior wall.  2. Right ventricular systolic function is normal. The right ventricular size is normal.  3. Left atrial size was moderately dilated. No left atrial/left atrial appendage thrombus was detected.  4. The MV annulus is dilated. There is mild restrictiction of P2. Severe central MR (4+) with prominent flow reversal in the pulmonary veins. The mitral valve is normal in structure. Severe mitral valve regurgitation. No evidence of mitral stenosis.  5. The aortic valve is normal in structure. Aortic valve regurgitation is not visualized. No aortic stenosis is present.  6. There is Moderate (Grade III) plaque involving the descending aorta.  7. The inferior vena cava is normal in size with greater than 50% respiratory variability, suggesting right atrial pressure of  3 mmHg.  8. There is a small patent foramen ovale. Conclusion(s)/Recommendation(s): Normal biventricular function without evidence of hemodynamically significant valvular heart disease. FINDINGS  Left Ventricle: Left ventricular ejection fraction, by estimation, is 60 to 65%. The left ventricle has normal function. The left ventricle demonstrates regional wall motion abnormalities. The left ventricular internal cavity size was normal in size. There is moderate concentric left ventricular hypertrophy. Right Ventricle: The right ventricular size is  normal. No increase in right ventricular wall thickness. Right ventricular systolic function is normal. Left Atrium: Left atrial size was moderately dilated. No left atrial/left atrial appendage thrombus was detected. Right Atrium: Right atrial size was normal in size. Pericardium: There is no evidence of pericardial effusion. Mitral Valve: The MV annulus is dilated. There is mild restrictiction of P2. Severe central MR (4+) with prominent flow reversal in the pulmonary veins. The mitral valve is normal in structure. Severe mitral valve regurgitation, with centrally-directed jet. No evidence of mitral valve stenosis. Tricuspid Valve: The tricuspid valve is normal in structure. Tricuspid valve regurgitation is mild . No evidence of tricuspid stenosis. Aortic Valve: The aortic valve is normal in structure. Aortic valve regurgitation is not visualized. No aortic stenosis is present. Pulmonic Valve: The pulmonic valve was normal in structure. Pulmonic valve regurgitation is trivial. No evidence of pulmonic stenosis. Aorta: The aortic root is normal in size and structure. There is moderate (Grade III) plaque involving the descending aorta. Venous: The inferior vena cava is normal in size with greater than 50% respiratory variability, suggesting right atrial pressure of 3 mmHg. IAS/Shunts: No atrial level shunt detected by color flow Doppler. A small patent foramen ovale is  detected.  MR Peak grad:    138.3 mmHg MR Mean grad:    84.0 mmHg MR Vmax:         588.00 cm/s MR Vmean:        422.0 cm/s MR PISA:         4.02 cm MR PISA Eff ROA: 21 mm MR PISA Radius:  0.80 cm [name] MD Electronically signed by [name] MD Signature Date/Time: 04/28/2021/6:00:45 PM    Final      Medications:     Scheduled Medications:  allopurinol  100 mg Oral Daily   aspirin EC  81 mg Oral Daily   carvedilol  12.5 mg Oral BID WC   Chlorhexidine Gluconate Cloth  6 each Topical Daily   clopidogrel  75 mg Oral QHS   enoxaparin (LOVENOX) injection  40 mg Subcutaneous Q24H   ezetimibe  10 mg Oral Daily   ferrous sulfate  325 mg Oral BID WC   hydrALAZINE  25 mg Oral TID   insulin aspart  0-20 Units Subcutaneous TID WC   insulin aspart  0-5 Units Subcutaneous QHS   insulin aspart  6 Units Subcutaneous TID WC   insulin detemir  40 Units Subcutaneous QHS   isosorbide mononitrate  60 mg Oral Daily   pantoprazole  40 mg Oral Daily   potassium chloride  20 mEq Oral Daily   sodium chloride flush  3 mL Intravenous Q12H   torsemide  50 mg Oral Daily    Infusions:  sodium chloride     promethazine (PHENERGAN) injection (IM or IVPB) Stopped (04/26/21 2339)    PRN Medications: sodium chloride, acetaminophen, HYDROcodone-acetaminophen, LORazepam, nitroGLYCERIN, ondansetron (ZOFRAN) IV, promethazine (PHENERGAN) injection (IM or IVPB), sodium chloride flush    Patient Profile   Ms Muscatello is 60 y.o. female with history of diastolic heart failure, CAD, DES RCA/LAD 2018, DMI, htn, hyperlipidemia, PAD, and OSA on CPAP, PAF s/p ablation, PAD s/p L great toe and 4th toe amputation.  Admitted with volume overload. Enrolled in FASTR trial.   Assessment/Plan   1. Acute on Chronic Diastolic Heart Failure - Echo 08/2020 EF 55-60%  - Echo (3/22): EF 55-60%, Grade II DD, normal RV - Echo (6/22): EF 60%, mod MR/TR (per chart review at [place]). -  now in a/c CHF w/ NYHA Class IIIb  symptoms and persistent, marked volume overload, despite outpatient diuretic titration.  CardioMEMs at 16 (goal =10). ReDs clip also elevated at 48% on admit. Course c/b AKI on Stage IV CKD w recent bump in SCr from baseline of 1.7>>2.4>>3.13 - 10/10 given 120 mg x 1.  Started on FASTR Trial.  - On 10/11 given metolazone. Brisk diuresis noted. Volume status improved. Wt down 12 lb.  - Stopped Reprieve/FASTR on 10/12.  - Diuretics on hold. Wt. Down total of 15 lb from admit.  Creatinine down to 2.97 today, she can start torsemide 50 mg daily for home.  - Continue carvedilol 12.5 mg bid. - Continue hydralazine 25 mg tid + Imdur 30 mg daily. - No SGLT2i with T1DM. - RHC with mild to moderately elevated filling pressures with very large v-waves in PCWP tracing suggestive of severe diastolic dysfunction versus severe MR. TEE showed severe central MR.   2. CAD - Known CAD with prior stents to mid LAD and pRCA.  - NSTEMI (2/22). Cath deferred due to AKI. Treated medically.  - NSTEMI (7/22)-->S/p PCI/DES to mid circ, balloon angioplasty to OM1  - Mild tightness but likely 2/2 fluid overload. EKG nonischemic.  HS Trop 55>58>>66>>62. Stable, no recurrent pain overnight. - Continue ASA + Plavix for a minimum of 12 months. - Lipids under good control w/ Repatha + Zetia (LDL 66 7/22). Followed by Dr. Debara Pickett.    3. AKI CKD IV  - prior h/o CIN following [facility] - Followed by Nephrology in [place] - SCr baseline 1.5  - Creatinine 2.3>2.4 >2.9>3.1>2.97.  Starting to trend down.  - BP okay   4. OSA -  CPAP qhs.    5. DM1 - Hgb A1c 6.6. - continue home insulin regimen    6. Chronic Anemia - Baseline Hgb 8-9 range, likely 2/2 renal disease. - hgb stable at 9.1 - Iron stores WNL  7. Acute Gout Flare - Uric Acid 9.3.  - treated with 3 day prednisone burst  8. Mitral regurgitation - Severe central MR on TEE on 10/14.   - Has appointment to see [name] with structural heart clinic to decide on repair  approach.    I think she can go home today.  Has followup in structural heart clinic and needs CHF clinic appointment 7-10 days.  Needs BMET next week.  Meds for home: torsemide 50 mg daily (as part of FASTR trial), KCl 20 daily, other home meds as prior to admission.    Length of Stay: 5  Loralie Champagne, MD  04/29/2021, 8:57 AM  Advanced Heart Failure Team Pager [phone] (M-F; 7a - 5p)  Please contact [facility] for night-coverage after hours (5p -7a ) and weekends on [email or web address]

## 2021-04-29 NOTE — Discharge Summary (Addendum)
Discharge Summary    Patient ID: Dana [name] MRN: [id number]; DOB: [birth year]  Admit date: 04/24/2021 Discharge date: 04/29/2021  PCP:  [name], MD   [place] Providers Cardiologist:  [name], MD  Advanced Heart Failure:  [name], MD  {  Discharge Diagnoses    Principal Problem:   Acute on chronic diastolic heart failure [facility]) Active Problems:   COPD (chronic obstructive pulmonary disease) ([facility])   Coronary artery disease involving native coronary artery of native heart without angina pectoris   Diabetic polyneuropathy associated with type 2 diabetes mellitus ([place])   S/P ablation of atrial fibrillation   Morbid obesity with BMI of 45.0-49.9, adult ([place])   Hypertensive heart and renal disease with congestive heart failure (HCC)   Chronic respiratory failure with hypoxia (HCC)   Statin myopathy   Bilateral lower extremity edema   Anemia due to stage 4 chronic kidney disease ([place])    Diagnostic Studies/Procedures    Echo 04/25/21: 1. Left ventricular ejection fraction, by estimation, is 65 to 70%. The  left ventricle has normal function. The left ventricle has no regional  wall motion abnormalities. Left ventricular diastolic parameters are  indeterminate.   2. Right ventricular systolic function is normal. The right ventricular  size is normal. Tricuspid regurgitation signal is inadequate for assessing  PA pressure.   3. The mitral valve is normal in structure. Moderate mitral valve  regurgitation. No evidence of mitral stenosis.   4. The aortic valve was not well visualized. Aortic valve regurgitation  is not visualized.   5. The inferior vena cava is dilated in size with >50% respiratory  variability, suggesting right atrial pressure of 8 mmHg.  _____________  Right heart cath 04/27/21: Findings:   RA = 8 RV = 59/9 PA = 61/17 (35) PCW = mean 23 (v = 49) -> performed multiple wedges Fick cardiac output/index = PVR = 1.6 WU FA  sat = 98% PA sat = 69%, 70% PaPi = 5.5   Assessment: 1. Mild to moderately elevated filling pressures with very large v-waves in PCWP tracing suggestive of severe diastolic dysfunction (has not had much MR on echo)  _____________  TEE 04/28/21:  1. Left ventricular ejection fraction, by estimation, is 60 to 65%. The  left ventricle has normal function. The left ventricle demonstrates  regional wall motion abnormalities (see scoring diagram/findings for  description). There is moderate concentric  left ventricular hypertrophy. There is akinesis of the left ventricular,  basal inferior wall.   2. Right ventricular systolic function is normal. The right ventricular  size is normal.   3. Left atrial size was moderately dilated. No left atrial/left atrial  appendage thrombus was detected.   4. The MV annulus is dilated. There is mild restrictiction of P2. Severe  central MR (4+) with prominent flow reversal in the pulmonary veins. The  mitral valve is normal in structure. Severe mitral valve regurgitation. No  evidence of mitral stenosis.   5. The aortic valve is normal in structure. Aortic valve regurgitation is  not visualized. No aortic stenosis is present.   6. There is Moderate (Grade III) plaque involving the descending aorta.   7. The inferior vena cava is normal in size with greater than 50%  respiratory variability, suggesting right atrial pressure of 3 mmHg.   8. There is a small patent foramen ovale.    History of Present Illness     Dana [name] is a 60 y.o. female with a history of  diastolic heart failure, CAD, DES RCA and LAD 2018, DMI, HTN, hyperlipidemia, OSA on CPAP, PAF s/p ablation, and PAD s/p L great toe and 4th toe amputation presented with CHF exacerbation. She is s/p cardiomems placement 8/19.   Hospital Course     Consultants: none  Acute on chronic diastolic heart failure She presented in acute on chronic diastolic heart failure with NYHA Class IIIb  symptoms and persistent, marked volume overload, despite outpatient diuretic titration.   Echo this admission showed preserved LVEF of 65-70% with no WMA. Cardiomems was 16 (goal is 10) and ReDs clip also elevated at 48% at admission. Diuresis was complicated by acute on chronic kidney disease stage IV. Metolazone was used in addition to IV diuresis. She was also enrolled in the FASTR trial on 10/10, which was stopped on 10/12. She was continued on coreg 12.5 mg BID, hydralazine 25 mg TID, and 30 mg imdur daily. SGLT2i was not added due to T1DM. Right heart cath on 04/27/21 showed mild to moderately elevated filling pressures with very large v-waves in PCWP tracing suggestive of severe diastolic dysfunction vs severe MR. She was discharged on 50 mg torsemide daily as part of the FASTR trial and Kcl 20 mEq daily. No other changes to home medications. Will collect BMP in 1 week.    Severe MR Following RHC results, TEE was conducted and confimred presence of severe MR. She will need referral to structural heart team for consideration - has appt with [name].   CAD Hyperlipidemia with LDL goal < 70  S/P prior stenting to mid LAD and prox RCA. NSTEMI 2/22 treated medically due to AKI. NSTEMI 7/22 resulted in DES to mid Lcx and balloon angioplasty to OM1. She is on DAPT with ASA and plfaix for a minimum of 12 months form PCI.  09/30/2020: VLDL 15 10/17/2020: Cholesterol, Total 175; HDL 43; LDL Chol Calc (NIH) 109; Triglycerides 130 LDL 7/22 was 66 Continue repatha and zetia.    Acute on chronic kidney disease stage IV sCr peaked at 3.13 --> down to 2.94.  Baseline appears to be 2.3    OSA on CPAP Continue compliance.   DM1 A1c 6.6% Continue home regimen.    Chronic anemia - suspect related to CKD IV Hb stable at discharge - 8.3, no active bleeding. Follow with PCP.   Acute gout flare Uric acid 9.3 treated with burst 3-day prednisone   Pt seen and examined by Dr. Aundra Dubin and deemed  stable for discharge. I have messaged the office for a TOC appt.    Did the patient have an acute coronary syndrome (MI, NSTEMI, STEMI, etc) this admission?:  No                               Did the patient have a percutaneous coronary intervention (stent / angioplasty)?:  No.        The patient will be scheduled for a TOC follow up appointment in 7-10 days.  A message has been sent to the [facility] and Scheduling Pool at the office where the patient should be seen for follow up.  _____________  Discharge Vitals Blood pressure (!) 109/41, pulse 71, temperature 98.2 F (36.8 C), temperature source Oral, resp. rate 15, height 5\' 5"  (1.651 m), weight 98.9 kg, SpO2 96 %.  Filed Weights   04/27/21 0440 04/28/21 0509 04/29/21 0532  Weight: 102.1 kg 101.1 kg 98.9 kg    Labs &  Radiologic Studies    CBC Recent Labs    04/27/21 1452 04/29/21 0131  WBC 11.3* 12.0*  HGB 9.1* 8.3*  HCT 28.7* 26.6*  MCV 86.4 87.2  PLT 326 644   Basic Metabolic Panel Recent Labs    04/28/21 0033 04/29/21 0131  NA 134* 138  K 4.4 4.0  CL 97* 102  CO2 24 27  GLUCOSE 273* 160*  BUN 96* 97*  CREATININE 3.13* 2.94*  CALCIUM 9.0 9.0  MG 2.2 2.3   Liver Function Tests Recent Labs    04/27/21 1452 04/29/21 0131  AST 20 15  ALT 12 13  ALKPHOS 92 74  BILITOT 0.6 0.7  PROT 7.5 6.7  ALBUMIN 3.5 3.1*   No results for input(s): LIPASE, AMYLASE in the last 72 hours. High Sensitivity Troponin:   Recent Labs  Lab 04/24/21 1910 04/24/21 2015 04/26/21 1519 04/26/21 1659  TROPONINIHS 55* 58* 66* 62*    BNP Invalid input(s): POCBNP D-Dimer No results for input(s): DDIMER in the last 72 hours. Hemoglobin A1C No results for input(s): HGBA1C in the last 72 hours. Fasting Lipid Panel No results for input(s): CHOL, HDL, LDLCALC, TRIG, CHOLHDL, LDLDIRECT in the last 72 hours. Thyroid Function Tests No results for input(s): TSH, T4TOTAL, T3FREE, THYROIDAB in the last 72 hours.  Invalid input(s):  FREET3 _____________  CARDIAC CATHETERIZATION  Result Date: 04/27/2021 Findings: RA = 8 RV = 59/9 PA = 61/17 (35) PCW = mean 23 (v = 49) -> performed multiple wedges Fick cardiac output/index = PVR = 1.6 WU FA sat = 98% PA sat = 69%, 70% PaPi = 5.5 Assessment: 1. Mild to moderately elevated filling pressures with very large v-waves in PCWP tracing suggestive of severe diastolic dysfunction (has not had much MR on echo)   DG Chest Port 1 View  Result Date: 04/25/2021 CLINICAL DATA:  Chronic CHF, shortness of breath EXAM: PORTABLE CHEST 1 VIEW COMPARISON:  01/24/2021 FINDINGS: Cardiomegaly. Unchanged mediastinal contours. Redemonstrated small device projecting over the upper left cardiac silhouette. Blunting left costophrenic angle, likely a small pleural effusion. No definite right pleural effusion. Heterogeneous opacities in the lung bases. No acute osseous abnormality. IMPRESSION: Cardiomegaly, small left pleural effusion and bibasilar airspace opacities, likely edema. Electronically Signed   By: Merilyn Baba M.D.   On: 04/25/2021 11:14   ECHOCARDIOGRAM COMPLETE  Result Date: 04/25/2021    ECHOCARDIOGRAM REPORT   Patient Name:   Dana [name] Date of Exam: 04/25/2021 Medical Rec #:  [id number]      Height:       65.0 in Accession #:    [id number]     Weight:       235.0 lb Date of Birth:  [birth year]      BSA:          2.119 m Patient Age:    60 years       BP:           132/51 mmHg Patient Gender: F              HR:           72 bpm. Exam Location:  Inpatient Procedure: 2D Echo, Cardiac Doppler, Color Doppler and Intracardiac            Opacification Agent Indications:    CHF-Acute Diastolic R51.88  History:        Patient has prior history of Echocardiogram examinations, most  recent 09/30/2020. CHF, Previous Myocardial Infarction; Risk                 Factors:Hypertension, Diabetes and Dyslipidemia. On                 [employer]-Guided diuretic therapy.  Sonographer:    Darlina Sicilian  RDCS Referring Phys: [place]  1. Left ventricular ejection fraction, by estimation, is 65 to 70%. The left ventricle has normal function. The left ventricle has no regional wall motion abnormalities. Left ventricular diastolic parameters are indeterminate.  2. Right ventricular systolic function is normal. The right ventricular size is normal. Tricuspid regurgitation signal is inadequate for assessing PA pressure.  3. The mitral valve is normal in structure. Moderate mitral valve regurgitation. No evidence of mitral stenosis.  4. The aortic valve was not well visualized. Aortic valve regurgitation is not visualized.  5. The inferior vena cava is dilated in size with >50% respiratory variability, suggesting right atrial pressure of 8 mmHg. Comparison(s): A prior study was performed on 09/30/20. No significant change from prior study. Best contrast administration in this study. FINDINGS  Left Ventricle: Left ventricular ejection fraction, by estimation, is 65 to 70%. The left ventricle has normal function. The left ventricle has no regional wall motion abnormalities. Definity contrast agent was given IV to delineate the left ventricular  endocardial borders. The left ventricular internal cavity size was normal in size. There is no left ventricular hypertrophy. Left ventricular diastolic parameters are indeterminate. Right Ventricle: The right ventricular size is normal. No increase in right ventricular wall thickness. Right ventricular systolic function is normal. Tricuspid regurgitation signal is inadequate for assessing PA pressure. Left Atrium: Left atrial size was normal in size. Right Atrium: Right atrial size was normal in size. Pericardium: There is no evidence of pericardial effusion. Mitral Valve: The mitral valve is normal in structure. Moderate mitral valve regurgitation. No evidence of mitral valve stenosis. Tricuspid Valve: The tricuspid valve is normal in structure. Tricuspid  valve regurgitation is not demonstrated. No evidence of tricuspid stenosis. Aortic Valve: The aortic valve was not well visualized. Aortic valve regurgitation is not visualized. Pulmonic Valve: The pulmonic valve was normal in structure. Pulmonic valve regurgitation is trivial. No evidence of pulmonic stenosis. Aorta: The aortic root and ascending aorta are structurally normal, with no evidence of dilitation. Pulmonary Artery: The pulmonary artery is of normal size. Venous: The inferior vena cava is dilated in size with greater than 50% respiratory variability, suggesting right atrial pressure of 8 mmHg. IAS/Shunts: The atrial septum is grossly normal.  LEFT VENTRICLE PLAX 2D LVIDd:         5.15 cm      Diastology LVIDs:         3.20 cm      LV e' medial:    3.38 cm/s LV PW:         0.70 cm      LV E/e' medial:  37.9 LV IVS:        0.90 cm      LV e' lateral:   6.15 cm/s LVOT diam:     2.00 cm      LV E/e' lateral: 20.8 LV SV:         78 LV SV Index:   37 LVOT Area:     3.14 cm  LV Volumes (MOD) LV vol d, MOD A2C: 122.0 ml LV vol d, MOD A4C: 132.0 ml LV vol s, MOD A2C: 41.1 ml LV  vol s, MOD A4C: 36.9 ml LV SV MOD A2C:     80.9 ml LV SV MOD A4C:     132.0 ml LV SV MOD BP:      87.6 ml RIGHT VENTRICLE RV S prime:     16.10 cm/s TAPSE (M-mode): 2.2 cm LEFT ATRIUM             Index LA diam:        4.10 cm 1.94 cm/m LA Vol (A2C):   33.3 ml 15.72 ml/m LA Vol (A4C):   35.9 ml 16.95 ml/m LA Biplane Vol: 36.4 ml 17.18 ml/m  AORTIC VALVE LVOT Vmax:   105.00 cm/s LVOT Vmean:  71.800 cm/s LVOT VTI:    0.249 m  AORTA Ao Root diam: 2.60 cm Ao Asc diam:  3.10 cm MITRAL VALVE MV Area (PHT): 4.49 cm       SHUNTS MV Decel Time: 169 msec       Systemic VTI:  0.25 m MR Peak grad:    80.6 mmHg    Systemic Diam: 2.00 cm MR Mean grad:    50.0 mmHg MR Vmax:         449.00 cm/s MR Vmean:        338.0 cm/s MR PISA:         1.01 cm MR PISA Eff ROA: 9 mm MR PISA Radius:  0.40 cm MV E velocity: 128.00 cm/s MV A velocity: 65.45 cm/s MV  E/A ratio:  1.96 Rudean Haskell MD Electronically signed by Rudean Haskell MD Signature Date/Time: 04/25/2021/4:27:57 PM    Final    ECHO TEE  Result Date: 04/28/2021    TRANSESOPHOGEAL ECHO REPORT   Patient Name:   Dana [name] Date of Exam: 04/28/2021 Medical Rec #:  [id number]      Height:       65.0 in Accession #:    [id number]     Weight:       222.9 lb Date of Birth:  [birth year]      BSA:          2.071 m Patient Age:    73 years       BP:           141/57 mmHg Patient Gender: F              HR:           69 bpm. Exam Location:  Inpatient Procedure: 2D Echo, Cardiac Doppler, Color Doppler and 3D Echo Indications:     Mitral regurgitation  History:         Patient has prior history of Echocardiogram examinations, most                  recent 04/25/2021. CHF, Previous Myocardial Infarction; Risk                  Factors:Hypertension, Diabetes and Dyslipidemia.  Sonographer:     Clayton Lefort RDCS (AE) Referring Phys:  [place] Diagnosing Phys: [name] MD PROCEDURE: The transesophogeal probe was passed without difficulty through the esophogus of the patient. Sedation performed by different physician. The patient was monitored while under deep sedation. Anesthestetic sedation was provided intravenously by Anesthesiology: 242.64mg  of Propofol, 50mg  of Lidocaine. Image quality was good. The patient's vital signs; including heart rate, blood pressure, and oxygen saturation; remained stable throughout the procedure. The patient developed no complications during the procedure. IMPRESSIONS  1. Left ventricular ejection fraction, by estimation, is  60 to 65%. The left ventricle has normal function. The left ventricle demonstrates regional wall motion abnormalities (see scoring diagram/findings for description). There is moderate concentric left ventricular hypertrophy. There is akinesis of the left ventricular, basal inferior wall.  2. Right ventricular systolic function is normal.  The right ventricular size is normal.  3. Left atrial size was moderately dilated. No left atrial/left atrial appendage thrombus was detected.  4. The MV annulus is dilated. There is mild restrictiction of P2. Severe central MR (4+) with prominent flow reversal in the pulmonary veins. The mitral valve is normal in structure. Severe mitral valve regurgitation. No evidence of mitral stenosis.  5. The aortic valve is normal in structure. Aortic valve regurgitation is not visualized. No aortic stenosis is present.  6. There is Moderate (Grade III) plaque involving the descending aorta.  7. The inferior vena cava is normal in size with greater than 50% respiratory variability, suggesting right atrial pressure of 3 mmHg.  8. There is a small patent foramen ovale. Conclusion(s)/Recommendation(s): Normal biventricular function without evidence of hemodynamically significant valvular heart disease. FINDINGS  Left Ventricle: Left ventricular ejection fraction, by estimation, is 60 to 65%. The left ventricle has normal function. The left ventricle demonstrates regional wall motion abnormalities. The left ventricular internal cavity size was normal in size. There is moderate concentric left ventricular hypertrophy. Right Ventricle: The right ventricular size is normal. No increase in right ventricular wall thickness. Right ventricular systolic function is normal. Left Atrium: Left atrial size was moderately dilated. No left atrial/left atrial appendage thrombus was detected. Right Atrium: Right atrial size was normal in size. Pericardium: There is no evidence of pericardial effusion. Mitral Valve: The MV annulus is dilated. There is mild restrictiction of P2. Severe central MR (4+) with prominent flow reversal in the pulmonary veins. The mitral valve is normal in structure. Severe mitral valve regurgitation, with centrally-directed jet. No evidence of mitral valve stenosis. Tricuspid Valve: The tricuspid valve is normal in  structure. Tricuspid valve regurgitation is mild . No evidence of tricuspid stenosis. Aortic Valve: The aortic valve is normal in structure. Aortic valve regurgitation is not visualized. No aortic stenosis is present. Pulmonic Valve: The pulmonic valve was normal in structure. Pulmonic valve regurgitation is trivial. No evidence of pulmonic stenosis. Aorta: The aortic root is normal in size and structure. There is moderate (Grade III) plaque involving the descending aorta. Venous: The inferior vena cava is normal in size with greater than 50% respiratory variability, suggesting right atrial pressure of 3 mmHg. IAS/Shunts: No atrial level shunt detected by color flow Doppler. A small patent foramen ovale is detected.  MR Peak grad:    138.3 mmHg MR Mean grad:    84.0 mmHg MR Vmax:         588.00 cm/s MR Vmean:        422.0 cm/s MR PISA:         4.02 cm MR PISA Eff ROA: 21 mm MR PISA Radius:  0.80 cm [name] MD Electronically signed by [name] MD Signature Date/Time: 04/28/2021/6:00:45 PM    Final    LONG TERM MONITOR (3-14 DAYS)  Result Date: 04/12/2021 Patch Wear Time:  14 days and 0 hours (2022-08-31T12:08:59-0400 to 2022-09-14T12:09:09-398) 1. Sinus rhythm - min HR of 51 bpm, max HR of 190 bpm, and avg HR of 70 bpm. Predominant 2. 2. One run of nonsustained Ventricular Tachycardia occurred lasting 5 beats with a max rate of 171 bpm (avg 128 bpm). 3. 66  runs of Supraventricular Tachycardia runs occurred, the run with the fastest interval lasting 5 beats with a max rate of 190 bpm, the longest lasting 16.6 secs with an avg rate of 125 bpm.  4. 4. Frequent PACs (11.2%, [id number]) 5. Rare PVCs [name], MD 3:53 PM   Disposition   Pt is being discharged home today in good condition.  Follow-up Plans & Appointments     Follow-up Information     [facility] Follow up on 05/08/2021.   Specialty: Cardiology Why: Advanced Heart Failure  Clinic at [facility] at 2 pm Entrance C, Gar[address] Contact information: [address] [id number] [place] [address] [place] Follow up in 1 week(s).   Specialty: Cardiology Why: Present for labwork on 10/21. You do not need to be fasting. Contact information: [address] [id number] [place] [phone]               Discharge Instructions     Diet - low sodium heart healthy   Complete by: As directed    Discharge instructions   Complete by: As directed    No driving for 2 days. No lifting over 5 lbs for 1 week. No sexual activity for 1 week. Keep procedure site clean & dry. If you notice increased pain, swelling, bleeding or pus, call/return!  You may shower, but no soaking baths/hot tubs/pools for 1 week.   Increase activity slowly   Complete by: As directed        Discharge Medications   Allergies as of 04/29/2021       Reactions   Naproxen Hives, Rash   Celecoxib Other (See Comments)   Stomach pain   Aspirin Other (See Comments)   CAN NOT TAKE DUE TO ULCERS (documented previously) but takes every day currently (as of 09/29/20) without reported issues   Glucosamine Hives, Swelling, Other (See Comments)   Angioedema   Iron    IV iron transfusion - hives - Feb 2022 hospitalization   Metoclopramide Other (See Comments)   Facial twitching and stuttering   Metolazone Other (See Comments)   Acute renal failure   Shellfish-derived Products Hives, Swelling, Other (See Comments)   Angioedema   Tramadol Nausea And Vomiting           Medication List     STOP taking these medications    aspirin 81 MG chewable tablet Replaced by: aspirin 81 MG EC tablet       TAKE these medications    allopurinol 100 MG tablet Commonly known as: ZYLOPRIM Take 1 tablet (100 mg total) by mouth daily.   aspirin 81 MG EC tablet Take  1 tablet (81 mg total) by mouth daily. Swallow whole. Start taking on: April 30, 2021 Replaces: aspirin 81 MG chewable tablet   BD Pen Needle Nano 2nd Gen 32G X 4 MM Misc Generic drug: Insulin Pen Needle 4 (four) times daily.   carvedilol 12.5 MG tablet Commonly known as: COREG Take 1 tablet (12.5 mg total) by mouth 2 (two) times daily.   clopidogrel 75 MG tablet Commonly known as: PLAVIX Take 1 tablet (75 mg total) by mouth at bedtime.   Cyanocobalamin 3000 MCG Caps Take 2 capsules by mouth daily. Patient purchased 3000 mcg gummies and taking 2 gummies daily   EPINEPHrine 0.3 mg/0.3 mL Soaj injection Commonly known as: EPI-PEN  Use as directed for life-threatening allergic reaction.   ezetimibe 10 MG tablet Commonly known as: ZETIA Take 1 tablet (10 mg total) by mouth daily.   famotidine 20 MG tablet Commonly known as: PEPCID Take 1 tablet (20 mg total) by mouth daily.   ferrous sulfate 325 (65 FE) MG tablet Take 325 mg by mouth 2 (two) times daily with a meal.   FISH OIL PO Take 1 capsule by mouth daily.   hydrALAZINE 25 MG tablet Commonly known as: APRESOLINE Take 1 tablet (25 mg total) by mouth 3 (three) times daily.   HYDROcodone-acetaminophen 7.5-325 MG tablet Commonly known as: NORCO Take 1 tablet by mouth 3 (three) times daily as needed for moderate pain.   insulin aspart 100 UNIT/ML injection Commonly known as: novoLOG Inject 2-10 Units into the skin 3 (three) times daily before meals.   isosorbide mononitrate 30 MG 24 hr tablet Commonly known as: IMDUR Take 1 tablet (30 mg total) by mouth daily.   lactulose 10 GM/15ML solution Commonly known as: CHRONULAC Take 15 mLs (10 g total) by mouth daily as needed for mild constipation.   latanoprost 0.005 % ophthalmic solution Commonly known as: XALATAN Place 1 drop into both eyes at bedtime.   Levemir FlexTouch 100 UNIT/ML FlexPen Generic drug: insulin detemir Inject 50 Units into the skin daily.    LORazepam 0.5 MG tablet Commonly known as: ATIVAN Take 1 tablet (0.5 mg total) by mouth daily as needed for anxiety.   magnesium oxide 400 MG tablet Commonly known as: MAG-OX Take 2 tablets (800 mg total) by mouth daily. Take 2 (400 mg) daily=800 mg   nitroGLYCERIN 0.4 MG SL tablet Commonly known as: NITROSTAT Place 1 tablet (0.4 mg total) under the tongue every 5 (five) minutes as needed for chest pain.   OXYGEN Inhale 2 L into the lungs at bedtime.   pantoprazole 40 MG tablet Commonly known as: PROTONIX Take 1 tablet (40 mg total) by mouth daily.   potassium chloride SA 20 MEQ tablet Commonly known as: KLOR-CON Take 1 tablet (20 mEq total) by mouth daily. Start taking on: April 30, 2021   Probiotic Caps Take 1 capsule by mouth daily.   promethazine 25 MG tablet Commonly known as: PHENERGAN Take 1 tablet (25 mg total) by mouth every 6 (six) hours as needed for nausea or vomiting.   Repatha SureClick 893 MG/ML Soaj Generic drug: Evolocumab Inject 1 Dose into the skin every 14 (fourteen) days.   torsemide 20 MG tablet Commonly known as: DEMADEX Take 2.5 tablets (50 mg total) by mouth daily. Start taking on: April 30, 2021 What changed: how much to take   Vitamin D 50 MCG (2000 UT) tablet Take 2,000 Units by mouth daily. Take one daily           Outstanding Labs/Studies   BMP in 1 week  Duration of Discharge Encounter   Greater than 30 minutes including physician time.  Signed, Tami Lin Darsi Tien, PA 04/29/2021, 11:27 AM

## 2021-04-30 ENCOUNTER — Encounter (HOSPITAL_COMMUNITY): Payer: Self-pay | Admitting: Internal Medicine

## 2021-04-30 NOTE — Anesthesia Postprocedure Evaluation (Signed)
Anesthesia Post Note  Patient: Dana [name]  Procedure(s) Performed: TRANSESOPHAGEAL ECHOCARDIOGRAM (TEE)     Patient location during evaluation: Endoscopy Anesthesia Type: MAC Level of consciousness: awake Pain management: pain level controlled Vital Signs Assessment: post-procedure vital signs reviewed and stable Respiratory status: spontaneous breathing, nonlabored ventilation, respiratory function stable and patient connected to nasal cannula oxygen Cardiovascular status: stable and blood pressure returned to baseline Postop Assessment: no apparent nausea or vomiting Anesthetic complications: no   No notable events documented.  Last Vitals:  Vitals:   04/29/21 0736 04/29/21 1137  BP:  (!) 111/51  Pulse:  70  Resp:  16  Temp: 36.8 C 36.6 C  SpO2: 96% 98%    Last Pain:  Vitals:   04/29/21 1137  TempSrc: Oral  PainSc:                  Diany Formosa P Monike Bragdon

## 2021-05-01 ENCOUNTER — Encounter: Payer: Self-pay | Admitting: Physician Assistant

## 2021-05-01 ENCOUNTER — Other Ambulatory Visit: Payer: Self-pay

## 2021-05-01 MED ORDER — HYDROCODONE-ACETAMINOPHEN 7.5-325 MG PO TABS: 1.0000 | ORAL_TABLET | Freq: Three times a day (TID) | ORAL | 0 refills | Status: DC | PRN

## 2021-05-02 NOTE — Progress Notes (Signed)
Dana [name] DOB [birth year]  This looks like a clippable valve. The fossa looks approachable for transseptal puncture in the Bicaval and SAXB views. LA dimensions are large enough for device steering and straddle. The MR jet is broad based and centrally located. The posterior leaflet measures 1.2 cm in the 137 LVOT grasping view. Gradient measures about 1-2 mmHG with a heart rate of 72 BPM; MVA measures 6.7 cm2. Based on this information, I'd start with an NTW and assess for gradient.

## 2021-05-03 ENCOUNTER — Other Ambulatory Visit: Payer: Self-pay

## 2021-05-03 ENCOUNTER — Telehealth: Payer: Self-pay

## 2021-05-03 DIAGNOSIS — N1832 Chronic kidney disease, stage 3b: Secondary | ICD-10-CM

## 2021-05-03 MED ORDER — PROMETHAZINE HCL 25 MG PO TABS: 25.0000 mg | ORAL_TABLET | Freq: Four times a day (QID) | ORAL | 1 refills | Status: DC | PRN

## 2021-05-03 NOTE — Telephone Encounter (Signed)
Transition Care Management Follow-up Telephone Call Date of discharge and from where: 04/29/21 from [facility] How have you been since you were released from the hospital? "I am doing ok" Any questions or concerns? No  Items Reviewed: Did the pt receive and understand the discharge instructions provided? Yes  Medications obtained and verified? Yes  Other? No  Any new allergies since your discharge? No  Dietary orders reviewed? Yes Do you have support at home? Yes   Home Care and Equipment/Supplies: Were home health services ordered? no If so, what is the name of the agency? Not applicable  Has the agency set up a time to come to the patient's home? not applicable Were any new equipment or medical supplies ordered?  No What is the name of the medical supply agency? Not applicable Were you able to get the supplies/equipment? not applicable Do you have any questions related to the use of the equipment or supplies? No  Functional Questionnaire: (I = Independent and D = Dependent) ADLs: I  Bathing/Dressing- I  Meal Prep- I  Eating- I  Maintaining continence- I  Transferring/Ambulation- I  Managing Meds- I  Follow up appointments reviewed:  PCP Hospital f/u appt confirmed? will follow up with specialist. [facility] f/u appt confirmed? Yes  Scheduled to see Dr. [name] on 05/08/21 @ 2pm. And Cardiologist, [name] on 05/05/21 @10am .  Are transportation arrangements needed? No  If their condition worsens, is the pt aware to call PCP or go to the Emergency Dept.? Yes Was the patient provided with contact information for the PCP's office or ED? Yes Was to pt encouraged to call back with questions or concerns? Yes   Thea Silversmith, RN, MSN, BSN, [facility] Care Management Coordinator [phone]

## 2021-05-04 ENCOUNTER — Other Ambulatory Visit: Payer: Self-pay | Admitting: *Deleted

## 2021-05-04 DIAGNOSIS — Z006 Encounter for examination for normal comparison and control in clinical research program: Secondary | ICD-10-CM

## 2021-05-04 NOTE — Progress Notes (Addendum)
Orders placed for FASTR trial (CBC, CMP, Magnesium), 7 day follow- up visit which is scheduled for 05-08-2021 in conjunction with her Heart Failure Clinic appt.

## 2021-05-04 NOTE — Progress Notes (Addendum)
Cardiology Office Note:    Date:  05/05/2021   ID:  Dana [name], DOB January 29, 1961, MRN [id number]  PCP:  Dana Brome, MD   [facility] Providers Cardiologist:  Dana Bickers, MD Advanced Heart Failure:  Dana Bickers, MD     Referring MD: Dana Brome, MD   CC:  Dyspnea  History of Present Illness:    PROBLEM LIST: 1.  Severe functional mitral regurgitation on TEE September 2022; NHYA III symptoms 2.  [id number] 3.  Diastolic heart failure 4.  Paroxysmal atrial fibrillation status post ablation 5.  Peripheral arterial disease status post toe amputation 6.  Chronic kidney disease with creatinine around 2.3; stage IV 7.  Coronary artery disease status post PCI of left circumflex with moderate LAD and RCA disease in 2022   Dana [name] is a 60 y.o. female with the above listed medical problems here for for recommendations regarding her severe functional mitral regurgitation.  The patient was admitted to the hospital for acute on chronic diastolic heart failure.  She underwent right heart catheterization which demonstrated V waves to 49 mmHg with a mean PA pressure of around 23 mmHg.  TEE demonstrated severe functional mitral regurgitation with a preserved ejection fraction.  She was medically stabilized and discharged.  Since being home she has been doing all right.  She however feels like she is filling up with fluid again.  Her weight is up 3 pounds.  For the last few years she is unable to do even moderate activity without getting short of breath.  Over the last few months this is gotten much worse.  Her quality of life has suffered.  She is unable to clean her house or do any of her activities of daily living.  She did have chest pain when she was severely volume overloaded but has had none since being home.  She does have stable two-pillow orthopnea and stable peripheral edema of about 2+.  She denies any presyncope or syncope.  She has had no palpitations.  She has had no severe  bleeding episodes.   hhh Past Surgical History:  Procedure Laterality Date   ABDOMINAL HYSTERECTOMY     Partial- Still has both ovaries   CARDIOVASCULAR STRESS TEST  08/13/2014   Nuclear; Normal   CARPAL TUNNEL RELEASE     CATARACT EXTRACTION, BILATERAL     CESAREAN SECTION     CHOLECYSTECTOMY     CORONARY ANGIOPLASTY WITH STENT PLACEMENT     3 blockages/ 1 stent   CORONARY STENT INTERVENTION N/A 09/29/2020   Procedure: CORONARY STENT INTERVENTION;  Surgeon: Sherren Mocha, MD;  Location: [place] [facility];  Service: Cardiovascular;  Laterality: N/A;  CFX   EYE SURGERY     LEFT HEART CATH AND CORONARY ANGIOGRAPHY N/A 09/29/2020   Procedure: LEFT HEART CATH AND CORONARY ANGIOGRAPHY;  Surgeon: Sherren Mocha, MD;  Location: [place] [facility];  Service: Cardiovascular;  Laterality: N/A;   RIGHT HEART CATH N/A 04/27/2021   Procedure: RIGHT HEART CATH;  Surgeon: Jolaine Artist, MD;  Location: [place] [facility];  Service: Cardiovascular;  Laterality: N/A;   RIGHT/LEFT HEART CATH AND CORONARY ANGIOGRAPHY N/A 01/07/2017   Procedure: Right/Left Heart Cath and Coronary Angiography;  Surgeon: Larey Dresser, MD;  Location: [place] [facility];  Service: Cardiovascular;  Laterality: N/A;   ROTATOR CUFF REPAIR     TEE WITHOUT CARDIOVERSION N/A 04/28/2021   Procedure: TRANSESOPHAGEAL ECHOCARDIOGRAM (TEE);  Surgeon: Jolaine Artist, MD;  Location: [facility]  ENDOSCOPY;  Service: Cardiovascular;  Laterality: N/A;   TOE AMPUTATION Left    US ECHOCARDIOGRAPHY  05/2017   Normal    Current Medications: Current Meds  Medication Sig   allopurinol (ZYLOPRIM) 100 MG tablet Take 1 tablet (100 mg total) by mouth daily.   aspirin EC 81 MG EC tablet Take 1 tablet (81 mg total) by mouth daily. Swallow whole.   BD PEN NEEDLE NANO 2ND GEN 32G X 4 MM MISC 4 (four) times daily.   carvedilol (COREG) 12.5 MG tablet Take 1 tablet (12.5 mg total) by mouth 2 (two) times daily.   Cholecalciferol (VITAMIN D) 50  MCG (2000 UT) tablet Take 2,000 Units by mouth daily. Take one daily   clopidogrel (PLAVIX) 75 MG tablet Take 1 tablet (75 mg total) by mouth at bedtime.   Cyanocobalamin 3000 MCG CAPS Take 2 capsules by mouth daily. Patient purchased 3000 mcg gummies and taking 2 gummies daily   EPINEPHrine 0.3 mg/0.3 mL IJ SOAJ injection Use as directed for life-threatening allergic reaction.   Evolocumab (REPATHA SURECLICK) 017 MG/ML SOAJ Inject 1 Dose into the skin every 14 (fourteen) days.   ezetimibe (ZETIA) 10 MG tablet Take 1 tablet (10 mg total) by mouth daily.   famotidine (PEPCID) 20 MG tablet Take 1 tablet (20 mg total) by mouth daily.   ferrous sulfate 325 (65 FE) MG tablet Take 325 mg by mouth 2 (two) times daily with a meal.   hydrALAZINE (APRESOLINE) 25 MG tablet Take 1 tablet (25 mg total) by mouth 3 (three) times daily.   HYDROcodone-acetaminophen (NORCO) 7.5-325 MG tablet Take 1 tablet by mouth 3 (three) times daily as needed for moderate pain.   insulin aspart (NOVOLOG) 100 UNIT/ML injection Inject 2-10 Units into the skin 3 (three) times daily before meals.   isosorbide mononitrate (IMDUR) 30 MG 24 hr tablet Take 1 tablet (30 mg total) by mouth daily.   lactulose (CHRONULAC) 10 GM/15ML solution Take 15 mLs (10 g total) by mouth daily as needed for mild constipation.   latanoprost (XALATAN) 0.005 % ophthalmic solution Place 1 drop into both eyes at bedtime.   LEVEMIR FLEXTOUCH 100 UNIT/ML FlexPen Inject 50 Units into the skin daily.   LORazepam (ATIVAN) 0.5 MG tablet Take 1 tablet (0.5 mg total) by mouth daily as needed for anxiety.   magnesium oxide (MAG-OX) 400 MG tablet Take 2 tablets (800 mg total) by mouth daily. Take 2 (400 mg) daily=800 mg   nitroGLYCERIN (NITROSTAT) 0.4 MG SL tablet Place 1 tablet (0.4 mg total) under the tongue every 5 (five) minutes as needed for chest pain.   Omega-3 Fatty Acids (FISH OIL PO) Take 1 capsule by mouth daily.   OXYGEN Inhale 2 L into the lungs at  bedtime.   pantoprazole (PROTONIX) 40 MG tablet Take 1 tablet (40 mg total) by mouth daily.   potassium chloride SA (KLOR-CON) 20 MEQ tablet Take 1 tablet (20 mEq total) by mouth daily.   Probiotic CAPS Take 1 capsule by mouth daily.   promethazine (PHENERGAN) 25 MG tablet Take 1 tablet (25 mg total) by mouth every 6 (six) hours as needed for nausea or vomiting.   torsemide (DEMADEX) 20 MG tablet Take 2.5 tablets (50 mg total) by mouth daily.     Allergies:   Naproxen, Celecoxib, Aspirin, Glucosamine, Iron, Metoclopramide, Metolazone, Shellfish-derived products, and Tramadol   Social History   Socioeconomic History   Marital status: Married    Spouse name: Not on file  Number of children: Not on file   Years of education: Not on file   Highest education level: Not on file  Occupational History   Not on file  Tobacco Use   Smoking status: Former   Smokeless tobacco: Never  Vaping Use   Vaping Use: Never used  Substance and Sexual Activity   Alcohol use: Not Currently   Drug use: Not Currently   Sexual activity: Not on file  Other Topics Concern   Not on file  Social History Narrative   Not on file   Social Determinants of Health   Financial Resource Strain: Low Risk    Difficulty of Paying Living Expenses: Not very hard  Food Insecurity: No Food Insecurity   Worried About Running Out of Food in the Last Year: Never true   Ran Out of Food in the Last Year: Never true  Transportation Needs: No Transportation Needs   Lack of Transportation (Medical): No   Lack of Transportation (Non-Medical): No  Physical Activity: Not on file  Stress: Not on file  Social Connections: Not on file     Family History: The patient's family history includes Bipolar disorder in an other family member; Breast cancer in her mother; Coronary artery disease in her mother; Depression in an other family member; Diabetes type II in her mother; Hyperlipidemia in her mother; Hypertension in her  father and mother; Lung cancer in her mother; Schizophrenia in an other family member.  ROS:   Please see the history of present illness.     All other systems reviewed and are negative.  EKGs/Labs/Other Studies Reviewed:    The following studies were reviewed today:  TEE reviewed with details above  Cardiac catheterization reviewed as detailed above  EKG:   The ekg ordered today demonstrates NSR from 10/22  Recent Labs: 10/12/2020: NT-Pro BNP 3,407 04/12/2021: B Natriuretic Peptide 353.3 04/24/2021: TSH 4.014 04/29/2021: ALT 13; BUN 97; Creatinine, Ser 2.94; Hemoglobin 8.3; Magnesium 2.3; Platelets 278; Potassium 4.0; Sodium 138   Recent Lipid Panel    Component Value Date/Time   CHOL 175 10/17/2020 0847   TRIG 130 10/17/2020 0847   HDL 43 10/17/2020 0847   CHOLHDL 4.1 10/17/2020 0847   CHOLHDL 2.4 09/30/2020 0241   VLDL 15 09/30/2020 0241   LDLCALC 109 (H) 10/17/2020 0847     Risk Assessment/Calculations:           Physical Exam:    VS:  BP (!) 150/62   Pulse 71   Ht 5\' 5"  (1.651 m)   Wt 231 lb 12.8 oz (105.1 kg)   SpO2 99%   BMI 38.57 kg/m     Wt Readings from Last 3 Encounters:  05/05/21 231 lb 12.8 oz (105.1 kg)  04/29/21 218 lb 0.6 oz (98.9 kg)  04/24/21 243 lb 6.4 oz (110.4 kg)     GEN:  In no acute distress HEENT: Normal NECK: No JVD; No carotid bruits LYMPHATICS: No lymphadenopathy CARDIAC: RRR, holosystolic murmur 3/6 at axilla RESPIRATORY:  Clear to auscultation without rales, wheezing or rhonchi  ABDOMEN: Soft, non-tender, non-distended MUSCULOSKELETAL:  +2 edema; No deformity  SKIN: Warm and dry NEUROLOGIC:  Alert and oriented x 3 PSYCHIATRIC:  Normal affect    STS score Procedure: Isolated MVR Risk of Mortality: 5.547% Renal Failure: 30.646% Permanent Stroke: 2.041% Prolonged Ventilation: 26.278% DSW Infection: 0.508% Reoperation: 6.433% Morbidity or Mortality: 50.126% Short Length of Stay: 6.892% Long Length of  Stay: 23.095%  Procedure: MV Repair Risk of Mortality: 2.715%  Renal Failure: 45.717% Permanent Stroke: 1.116% Prolonged Ventilation: 19.048% DSW Infection: 0.265% Reoperation: 2.589% Morbidity or Mortality: 38.460% Short Length of Stay: 14.383% Long Length of Stay: 16.787%   ASSESSMENT and PLAN   1. Nonrheumatic mitral valve regurgitation   The patient has developed severe functional mitral regurgitation, NYHA III.  It is interesting that this is occurred after her left circumflex infarct and suggest that the wall motion abnormalities produced by this ischemic event has led to LV remodeling and now severe functional mitral regurgitation.  Given her multiple comorbidities including chronic kidney disease stage IV we will pursue a transcatheter edge-to-edge repair of the mitral valve.  I discussed the risks and benefits of this procedure with the patient and she is interested in pursuing this.  I hope to schedule the procedure for the coming weeks.   2. Dyspnea, unspecified type   Likely due to severe mitral regurgitation as discussed above.  3. Acute on chronic diastolic heart failure ([place])   See discussion above.           Medication Adjustments/Labs and Tests Ordered: Current medicines are reviewed at length with the patient today.  Concerns regarding medicines are outlined above.  No orders of the defined types were placed in this encounter.  No orders of the defined types were placed in this encounter.   Patient Instructions  Medication Instructions:  No changes *If you need a refill on your cardiac medications before your next appointment, please call your pharmacy*   Follow-Up: Structural Heart Nurse Navigator will be contacting you for next steps.      Signed, Early Osmond, MD  05/05/2021 11:14 AM    [place]

## 2021-05-04 NOTE — H&P (View-Only) (Signed)
Cardiology Office Note:    Date:  05/05/2021   ID:  Dana [name], DOB 07/22/1960, MRN [id number]  PCP:  [name], MD   [facility] Providers Cardiologist:  [name], MD Advanced Heart Failure:  [name], MD     Referring MD: [name], MD   CC:  Dyspnea  History of Present Illness:    PROBLEM LIST: 1.  Severe functional mitral regurgitation on TEE September 2022; NHYA III symptoms 2.  [id number] 3.  Diastolic heart failure 4.  Paroxysmal atrial fibrillation status post ablation 5.  Peripheral arterial disease status post toe amputation 6.  Chronic kidney disease with creatinine around 2.3; stage IV 7.  Coronary artery disease status post PCI of left circumflex with moderate LAD and RCA disease in 2022   Dana [name] is a 60 y.o. female with the above listed medical problems here for for recommendations regarding her severe functional mitral regurgitation.  The patient was admitted to the hospital for acute on chronic diastolic heart failure.  She underwent right heart catheterization which demonstrated V waves to 49 mmHg with a mean PA pressure of around 23 mmHg.  TEE demonstrated severe functional mitral regurgitation with a preserved ejection fraction.  She was medically stabilized and discharged.  Since being home she has been doing all right.  She however feels like she is filling up with fluid again.  Her weight is up 3 pounds.  For the last few years she is unable to do even moderate activity without getting short of breath.  Over the last few months this is gotten much worse.  Her quality of life has suffered.  She is unable to clean her house or do any of her activities of daily living.  She did have chest pain when she was severely volume overloaded but has had none since being home.  She does have stable two-pillow orthopnea and stable peripheral edema of about 2+.  She denies any presyncope or syncope.  She has had no palpitations.  She has had no severe  bleeding episodes.   hhh Past Surgical History:  Procedure Laterality Date   ABDOMINAL HYSTERECTOMY     Partial- Still has both ovaries   CARDIOVASCULAR STRESS TEST  08/13/2014   Nuclear; Normal   CARPAL TUNNEL RELEASE     CATARACT EXTRACTION, BILATERAL     CESAREAN SECTION     CHOLECYSTECTOMY     CORONARY ANGIOPLASTY WITH STENT PLACEMENT     3 blockages/ 1 stent   CORONARY STENT INTERVENTION N/A 09/29/2020   Procedure: CORONARY STENT INTERVENTION;  Surgeon: Sherren Mocha, MD;  Location: [place] [facility];  Service: Cardiovascular;  Laterality: N/A;  CFX   EYE SURGERY     LEFT HEART CATH AND CORONARY ANGIOGRAPHY N/A 09/29/2020   Procedure: LEFT HEART CATH AND CORONARY ANGIOGRAPHY;  Surgeon: Sherren Mocha, MD;  Location: [place] [facility];  Service: Cardiovascular;  Laterality: N/A;   RIGHT HEART CATH N/A 04/27/2021   Procedure: RIGHT HEART CATH;  Surgeon: Jolaine Artist, MD;  Location: [place] [facility];  Service: Cardiovascular;  Laterality: N/A;   RIGHT/LEFT HEART CATH AND CORONARY ANGIOGRAPHY N/A 01/07/2017   Procedure: Right/Left Heart Cath and Coronary Angiography;  Surgeon: Larey Dresser, MD;  Location: [place] [facility];  Service: Cardiovascular;  Laterality: N/A;   ROTATOR CUFF REPAIR     TEE WITHOUT CARDIOVERSION N/A 04/28/2021   Procedure: TRANSESOPHAGEAL ECHOCARDIOGRAM (TEE);  Surgeon: Jolaine Artist, MD;  Location: [facility]  ENDOSCOPY;  Service: Cardiovascular;  Laterality: N/A;   TOE AMPUTATION Left    US ECHOCARDIOGRAPHY  05/2017   Normal    Current Medications: Current Meds  Medication Sig   allopurinol (ZYLOPRIM) 100 MG tablet Take 1 tablet (100 mg total) by mouth daily.   aspirin EC 81 MG EC tablet Take 1 tablet (81 mg total) by mouth daily. Swallow whole.   BD PEN NEEDLE NANO 2ND GEN 32G X 4 MM MISC 4 (four) times daily.   carvedilol (COREG) 12.5 MG tablet Take 1 tablet (12.5 mg total) by mouth 2 (two) times daily.   Cholecalciferol (VITAMIN D) 50  MCG (2000 UT) tablet Take 2,000 Units by mouth daily. Take one daily   clopidogrel (PLAVIX) 75 MG tablet Take 1 tablet (75 mg total) by mouth at bedtime.   Cyanocobalamin 3000 MCG CAPS Take 2 capsules by mouth daily. Patient purchased 3000 mcg gummies and taking 2 gummies daily   EPINEPHrine 0.3 mg/0.3 mL IJ SOAJ injection Use as directed for life-threatening allergic reaction.   Evolocumab (REPATHA SURECLICK) 003 MG/ML SOAJ Inject 1 Dose into the skin every 14 (fourteen) days.   ezetimibe (ZETIA) 10 MG tablet Take 1 tablet (10 mg total) by mouth daily.   famotidine (PEPCID) 20 MG tablet Take 1 tablet (20 mg total) by mouth daily.   ferrous sulfate 325 (65 FE) MG tablet Take 325 mg by mouth 2 (two) times daily with a meal.   hydrALAZINE (APRESOLINE) 25 MG tablet Take 1 tablet (25 mg total) by mouth 3 (three) times daily.   HYDROcodone-acetaminophen (NORCO) 7.5-325 MG tablet Take 1 tablet by mouth 3 (three) times daily as needed for moderate pain.   insulin aspart (NOVOLOG) 100 UNIT/ML injection Inject 2-10 Units into the skin 3 (three) times daily before meals.   isosorbide mononitrate (IMDUR) 30 MG 24 hr tablet Take 1 tablet (30 mg total) by mouth daily.   lactulose (CHRONULAC) 10 GM/15ML solution Take 15 mLs (10 g total) by mouth daily as needed for mild constipation.   latanoprost (XALATAN) 0.005 % ophthalmic solution Place 1 drop into both eyes at bedtime.   LEVEMIR FLEXTOUCH 100 UNIT/ML FlexPen Inject 50 Units into the skin daily.   LORazepam (ATIVAN) 0.5 MG tablet Take 1 tablet (0.5 mg total) by mouth daily as needed for anxiety.   magnesium oxide (MAG-OX) 400 MG tablet Take 2 tablets (800 mg total) by mouth daily. Take 2 (400 mg) daily=800 mg   nitroGLYCERIN (NITROSTAT) 0.4 MG SL tablet Place 1 tablet (0.4 mg total) under the tongue every 5 (five) minutes as needed for chest pain.   Omega-3 Fatty Acids (FISH OIL PO) Take 1 capsule by mouth daily.   OXYGEN Inhale 2 L into the lungs at  bedtime.   pantoprazole (PROTONIX) 40 MG tablet Take 1 tablet (40 mg total) by mouth daily.   potassium chloride SA (KLOR-CON) 20 MEQ tablet Take 1 tablet (20 mEq total) by mouth daily.   Probiotic CAPS Take 1 capsule by mouth daily.   promethazine (PHENERGAN) 25 MG tablet Take 1 tablet (25 mg total) by mouth every 6 (six) hours as needed for nausea or vomiting.   torsemide (DEMADEX) 20 MG tablet Take 2.5 tablets (50 mg total) by mouth daily.     Allergies:   Naproxen, Celecoxib, Aspirin, Glucosamine, Iron, Metoclopramide, Metolazone, Shellfish-derived products, and Tramadol   Social History   Socioeconomic History   Marital status: Married    Spouse name: Not on file  Number of children: Not on file   Years of education: Not on file   Highest education level: Not on file  Occupational History   Not on file  Tobacco Use   Smoking status: Former   Smokeless tobacco: Never  Vaping Use   Vaping Use: Never used  Substance and Sexual Activity   Alcohol use: Not Currently   Drug use: Not Currently   Sexual activity: Not on file  Other Topics Concern   Not on file  Social History Narrative   Not on file   Social Determinants of Health   Financial Resource Strain: Low Risk    Difficulty of Paying Living Expenses: Not very hard  Food Insecurity: No Food Insecurity   Worried About Running Out of Food in the Last Year: Never true   Ran Out of Food in the Last Year: Never true  Transportation Needs: No Transportation Needs   Lack of Transportation (Medical): No   Lack of Transportation (Non-Medical): No  Physical Activity: Not on file  Stress: Not on file  Social Connections: Not on file     Family History: The patient's family history includes Bipolar disorder in an other family member; Breast cancer in her mother; Coronary artery disease in her mother; Depression in an other family member; Diabetes type II in her mother; Hyperlipidemia in her mother; Hypertension in her  father and mother; Lung cancer in her mother; Schizophrenia in an other family member.  ROS:   Please see the history of present illness.     All other systems reviewed and are negative.  EKGs/Labs/Other Studies Reviewed:    The following studies were reviewed today:  TEE reviewed with details above  Cardiac catheterization reviewed as detailed above  EKG:   The ekg ordered today demonstrates NSR from 10/22  Recent Labs: 10/12/2020: NT-Pro BNP 3,407 04/12/2021: B Natriuretic Peptide 353.3 04/24/2021: TSH 4.014 04/29/2021: ALT 13; BUN 97; Creatinine, Ser 2.94; Hemoglobin 8.3; Magnesium 2.3; Platelets 278; Potassium 4.0; Sodium 138   Recent Lipid Panel    Component Value Date/Time   CHOL 175 10/17/2020 0847   TRIG 130 10/17/2020 0847   HDL 43 10/17/2020 0847   CHOLHDL 4.1 10/17/2020 0847   CHOLHDL 2.4 09/30/2020 0241   VLDL 15 09/30/2020 0241   LDLCALC 109 (H) 10/17/2020 0847     Risk Assessment/Calculations:           Physical Exam:    VS:  BP (!) 150/62   Pulse 71   Ht 5\' 5"  (1.651 m)   Wt 231 lb 12.8 oz (105.1 kg)   SpO2 99%   BMI 38.57 kg/m     Wt Readings from Last 3 Encounters:  05/05/21 231 lb 12.8 oz (105.1 kg)  04/29/21 218 lb 0.6 oz (98.9 kg)  04/24/21 243 lb 6.4 oz (110.4 kg)     GEN:  In no acute distress HEENT: Normal NECK: No JVD; No carotid bruits LYMPHATICS: No lymphadenopathy CARDIAC: RRR, holosystolic murmur 3/6 at axilla RESPIRATORY:  Clear to auscultation without rales, wheezing or rhonchi  ABDOMEN: Soft, non-tender, non-distended MUSCULOSKELETAL:  +2 edema; No deformity  SKIN: Warm and dry NEUROLOGIC:  Alert and oriented x 3 PSYCHIATRIC:  Normal affect    STS score Procedure: Isolated MVR Risk of Mortality: 5.547% Renal Failure: 30.646% Permanent Stroke: 2.041% Prolonged Ventilation: 26.278% DSW Infection: 0.508% Reoperation: 6.433% Morbidity or Mortality: 50.126% Short Length of Stay: 6.892% Long Length of  Stay: 23.095%  Procedure: MV Repair Risk of Mortality: 2.715%  Renal Failure: 45.717% Permanent Stroke: 1.116% Prolonged Ventilation: 19.048% DSW Infection: 0.265% Reoperation: 2.589% Morbidity or Mortality: 38.460% Short Length of Stay: 14.383% Long Length of Stay: 16.787%   ASSESSMENT and PLAN   1. Nonrheumatic mitral valve regurgitation   The patient has developed severe functional mitral regurgitation, NYHA III.  It is interesting that this is occurred after her left circumflex infarct and suggest that the wall motion abnormalities produced by this ischemic event has led to LV remodeling and now severe functional mitral regurgitation.  Given her multiple comorbidities including chronic kidney disease stage IV we will pursue a transcatheter edge-to-edge repair of the mitral valve.  I discussed the risks and benefits of this procedure with the patient and she is interested in pursuing this.  I hope to schedule the procedure for the coming weeks.   2. Dyspnea, unspecified type   Likely due to severe mitral regurgitation as discussed above.  3. Acute on chronic diastolic heart failure ([place])   See discussion above.           Medication Adjustments/Labs and Tests Ordered: Current medicines are reviewed at length with the patient today.  Concerns regarding medicines are outlined above.  No orders of the defined types were placed in this encounter.  No orders of the defined types were placed in this encounter.   Patient Instructions  Medication Instructions:  No changes *If you need a refill on your cardiac medications before your next appointment, please call your pharmacy*   Follow-Up: Structural Heart Nurse Navigator will be contacting you for next steps.      Signed, Early Osmond, MD  05/05/2021 11:14 AM    [place]

## 2021-05-05 ENCOUNTER — Other Ambulatory Visit: Payer: Self-pay

## 2021-05-05 ENCOUNTER — Ambulatory Visit: Payer: HMO | Admitting: Internal Medicine

## 2021-05-05 ENCOUNTER — Encounter: Payer: Self-pay | Admitting: Internal Medicine

## 2021-05-05 VITALS — BP 150/62 | HR 71 | Ht 65.0 in | Wt 231.8 lb

## 2021-05-05 DIAGNOSIS — I340 Nonrheumatic mitral (valve) insufficiency: Secondary | ICD-10-CM

## 2021-05-05 DIAGNOSIS — I5033 Acute on chronic diastolic (congestive) heart failure: Secondary | ICD-10-CM

## 2021-05-05 DIAGNOSIS — R0600 Dyspnea, unspecified: Secondary | ICD-10-CM | POA: Diagnosis not present

## 2021-05-05 NOTE — Patient Instructions (Signed)
Medication Instructions:  No changes *If you need a refill on your cardiac medications before your next appointment, please call your pharmacy*   Follow-Up: Structural Heart Nurse Navigator will be contacting you for next steps.

## 2021-05-08 ENCOUNTER — Encounter (HOSPITAL_COMMUNITY): Payer: HMO

## 2021-05-08 ENCOUNTER — Other Ambulatory Visit: Payer: Self-pay | Admitting: *Deleted

## 2021-05-08 ENCOUNTER — Encounter: Payer: HMO | Admitting: *Deleted

## 2021-05-08 ENCOUNTER — Encounter (HOSPITAL_COMMUNITY): Payer: Self-pay

## 2021-05-08 ENCOUNTER — Ambulatory Visit (HOSPITAL_COMMUNITY)
Admit: 2021-05-08 | Discharge: 2021-05-08 | Disposition: A | Discharge status: Home / Self Care | Payer: HMO | Source: Ambulatory Visit | Attending: Family Medicine | Admitting: Family Medicine

## 2021-05-08 ENCOUNTER — Telehealth: Payer: Self-pay

## 2021-05-08 ENCOUNTER — Other Ambulatory Visit: Payer: Self-pay

## 2021-05-08 VITALS — BP 102/56 | HR 66 | Wt 231.2 lb

## 2021-05-08 DIAGNOSIS — R300 Dysuria: Secondary | ICD-10-CM | POA: Diagnosis not present

## 2021-05-08 DIAGNOSIS — I2510 Atherosclerotic heart disease of native coronary artery without angina pectoris: Secondary | ICD-10-CM | POA: Diagnosis not present

## 2021-05-08 DIAGNOSIS — N179 Acute kidney failure, unspecified: Secondary | ICD-10-CM | POA: Diagnosis not present

## 2021-05-08 DIAGNOSIS — Z886 Allergy status to analgesic agent status: Secondary | ICD-10-CM | POA: Diagnosis not present

## 2021-05-08 DIAGNOSIS — D62 Acute posthemorrhagic anemia: Secondary | ICD-10-CM | POA: Diagnosis not present

## 2021-05-08 DIAGNOSIS — I130 Hypertensive heart and chronic kidney disease with heart failure and stage 1 through stage 4 chronic kidney disease, or unspecified chronic kidney disease: Secondary | ICD-10-CM | POA: Insufficient documentation

## 2021-05-08 DIAGNOSIS — I480 Paroxysmal atrial fibrillation: Secondary | ICD-10-CM | POA: Diagnosis not present

## 2021-05-08 DIAGNOSIS — E1043 Type 1 diabetes mellitus with diabetic autonomic (poly)neuropathy: Secondary | ICD-10-CM | POA: Diagnosis not present

## 2021-05-08 DIAGNOSIS — Z006 Encounter for examination for normal comparison and control in clinical research program: Secondary | ICD-10-CM

## 2021-05-08 DIAGNOSIS — Z7982 Long term (current) use of aspirin: Secondary | ICD-10-CM | POA: Insufficient documentation

## 2021-05-08 DIAGNOSIS — G4733 Obstructive sleep apnea (adult) (pediatric): Secondary | ICD-10-CM | POA: Diagnosis not present

## 2021-05-08 DIAGNOSIS — E1022 Type 1 diabetes mellitus with diabetic chronic kidney disease: Secondary | ICD-10-CM | POA: Diagnosis not present

## 2021-05-08 DIAGNOSIS — Z7902 Long term (current) use of antithrombotics/antiplatelets: Secondary | ICD-10-CM | POA: Insufficient documentation

## 2021-05-08 DIAGNOSIS — I340 Nonrheumatic mitral (valve) insufficiency: Secondary | ICD-10-CM | POA: Insufficient documentation

## 2021-05-08 DIAGNOSIS — N1832 Chronic kidney disease, stage 3b: Secondary | ICD-10-CM | POA: Insufficient documentation

## 2021-05-08 DIAGNOSIS — I252 Old myocardial infarction: Secondary | ICD-10-CM | POA: Insufficient documentation

## 2021-05-08 DIAGNOSIS — Z79899 Other long term (current) drug therapy: Secondary | ICD-10-CM | POA: Diagnosis not present

## 2021-05-08 DIAGNOSIS — Z955 Presence of coronary angioplasty implant and graft: Secondary | ICD-10-CM | POA: Diagnosis not present

## 2021-05-08 DIAGNOSIS — Z09 Encounter for follow-up examination after completed treatment for conditions other than malignant neoplasm: Secondary | ICD-10-CM | POA: Diagnosis not present

## 2021-05-08 DIAGNOSIS — Z87891 Personal history of nicotine dependence: Secondary | ICD-10-CM | POA: Diagnosis not present

## 2021-05-08 DIAGNOSIS — I5033 Acute on chronic diastolic (congestive) heart failure: Secondary | ICD-10-CM | POA: Insufficient documentation

## 2021-05-08 DIAGNOSIS — Z8249 Family history of ischemic heart disease and other diseases of the circulatory system: Secondary | ICD-10-CM | POA: Insufficient documentation

## 2021-05-08 DIAGNOSIS — R040 Epistaxis: Secondary | ICD-10-CM | POA: Diagnosis not present

## 2021-05-08 DIAGNOSIS — Z794 Long term (current) use of insulin: Secondary | ICD-10-CM | POA: Insufficient documentation

## 2021-05-08 DIAGNOSIS — E782 Mixed hyperlipidemia: Secondary | ICD-10-CM | POA: Insufficient documentation

## 2021-05-08 MED ORDER — TORSEMIDE 20 MG PO TABS: 60.0000 mg | ORAL_TABLET | Freq: Every day | ORAL | 3 refills | Status: DC

## 2021-05-08 NOTE — Addendum Note (Signed)
Encounter addended by: Joette Catching, PA-C on: 05/08/2021 3:21 PM  Actions taken: Clinical Note Signed

## 2021-05-08 NOTE — Patient Instructions (Addendum)
EKG was done today  Labs were done today, if labs are abnormal the clinic will call you  Your physician recommends that you return for lab work in: 2 weeks with follow up appointment  Your physician recommends that you schedule a follow-up appointment in: 2 weeks  TAKE 80 mg 4 tablets tomorrow 05/09/2021 THEN take 60 mg 3 tablets daily starting 05/10/2021  PLEASE CALL IF FEELING WORSE BEFORE THE WEEKEND OR MORE NOSEBLEEDS  At the [facility], you and your health needs are our priority. As part of our continuing mission to provide you with exceptional heart care, we have created designated Provider Care Teams. These Care Teams include your primary Cardiologist (physician) and Advanced Practice Providers (APPs- Physician Assistants and Nurse Practitioners) who all work together to provide you with the care you need, when you need it.   You may see any of the following providers on your designated Care Team at your next follow up: Dr [name] Dr Haynes Kerns, NP Lyda Jester, [place] [facility] [place], [place] Audry Riles, PharmD   Please be sure to bring in all your medications bottles to every appointment.   If you have any questions or concerns before your next appointment please send [name] a message through [place] or call our office at [phone].    TO LEAVE A MESSAGE FOR THE NURSE SELECT OPTION 2, PLEASE LEAVE A MESSAGE INCLUDING: YOUR NAME DATE OF BIRTH CALL BACK NUMBER REASON FOR CALL**this is important as we prioritize the call backs  YOU WILL RECEIVE A CALL BACK THE SAME DAY AS LONG AS YOU CALL BEFORE 4:00 PM

## 2021-05-08 NOTE — Progress Notes (Addendum)
Advanced Heart Failure Clinic Note   PCP: CoxElnita Maxwell, MD PCP-Cardiologist: [name], MD   Reason for Visit: Hospital f/u acute on chronic diastolic HF  HPI: [name] is 60 y.o. female with history of diastolic heart failure s/p cardiomems 02/2018, CAD, s/p DES RCA and LAD 2018, DMI, htn, hyperlipidemia, PAD, and OSA on CPAP, PAF s/p ablation, PAD s/p L great toe and 4th toe amputation, stage IIIb CKD.   Admitted 03/22 with NSTEMI and a/c CHF. LHC 03/22 showed moderate mid-LAD and mid-RCA disease, severe mid-L-Cx stenosis s/p PCI/DES mid Cx and side branch angioplasty of OM1.   Unfortunately she developed a contrast-induced nephropathy after cath and creatinine increased from 2.2 to a peak of 5.5. Additionally, she developed severe nausea and vomiting, gastroparesis and required IV antiplatelet therapy.  During this time she developed epistaxis and acute blood loss anemia requiring transfusion.  She was switched back to oral DAPT. Diuretics were reintroduced and her creatinine began to improve over the following days. Her leukocytosis persisted but all her infectious workup was negative and this was felt to be reactive.    Per chart review, she was admitted 6/22 to [facility] for DKA, started on insulin gtt, IVF and diuretics held. Developed pulmonary vascular congestion vs PNA and was started on IV abx, and diuretics were resumed. She suffered a PEA arrest 01/12/21 with ROSC after 10-15 minutes of CPR and was transferred to [facility]; precipitating factor for PEA arrest thought to be respiratory driven. On arrival, she was still in DKA, AKI on CKD with oliguria and Nephrology was consulted for further management. TTE with EF 60%, min MR/TR. Concern for stroke due to left-sided weakness and MRI confirmed anoxic brain injury, but no definite infarct. She was extubated, SCr returned to baseline of 1.6-1.8 and she was discharged to rehab facility.   Zio placed 8/22 for palpitations, showing mostly  SB/SR no afib, 66 SVT runs, frequent isolated SVE (11.2%).  Admitted from AHF clinic 10/22 for A/C CHF after failing outpatient escalation in diuretic therapy. She was enrolled in FASTR trial and diuresed > 15 lb.  Hospitalization complicated by AKI on CKD, Scr up to 3.13. RHC showed mild to moderate elevating filling pressures with large v-waves in PCWP tracing suggesting severe MR vs severe diastolic dysfunction. TEE showed severe central MR. She was referred to structural heart clinic to discuss management of MR.   She was seen by [name] in structural clinic on 05/05/2021. Given her multiple comorbidities, she was felt to be best suited for TEER of the mitral valve  (scheduled for 05/18/21).   She is here today for hospita f/u. She felt great for a few days after discharge. On 10/20, began noticing more shortness of breath and leg edema. Comfortable at rest but dyspneic after walking less than 10 feet. No PND, has chronic 2 pillow orthopnea. Had 1 episode of chest pressure while at rest this past week lasting 10 minutes in duration, has not recurred.   Had 2 nosebleeds this past week, one took over an hour to stop. Denies any melena or hematochezia.  Notes some dysuria since yesterday and is concerned she may have a UTI. No hematuria.  Has been adherent with medications as well as fluid/sodium restriction.    Cardiac Studies: - RHC (10/22):   RA = 8 RV = 59/9 PA = 61/17 (35) PCW = mean 23 (v = 49) -> performed multiple wedges Fick cardiac output/index = PVR = 1.6 WU FA sat = 98%  PA sat = 69%, 70% PaPi = 5.5 Assessment: 1. Mild to moderately elevated filling pressures with very large v-waves in PCWP tracing suggestive of severe diastolic dysfunction vs severe MR  - TEE (10/22): EF 55-60%, basal inferior akinesis, severe central MR. - Echo (10/22): EF 65-70%, RV ok, moderate MR - Echo (5/21): EF 60-65%, RV ok, Grade II DD. - Echo (2/22): EF 55-60%, RV ok, Grade II DD, mod MR -  Echo (3/22): EF 55-60%, Grade II DD, normal RV - Echo (6/22): EF 60%, mod MR/TR (per chart review at [place]). - Zio (8/19): no arrhythmias, occasional PAC/PVCs - Zio (9/22): mostly SB/SR no afib, 66 SVT runs, frequent isolated SVE (11.2%).  - LHC (3/22): 3 vessel CAD with moderate mid-LAD and mid-RCA stenoses, and severe mid-LCx stenosis; successful PCI of the mid-circumflex, reducing severe 90% stenosis to 0% with a 2.75x22 mm Resolute Onyx DES, provisional side branch angioplasty of the OM1 with a 2.5 mm balloon through the stent struts  Review of systems complete and found to be negative unless listed in HPI.    Past Medical History:  Diagnosis Date   Acquired absence of left great toe [facility])    Acquired absence of other left toe(s) ([place])    Atherosclerotic heart disease of native coronary artery without angina pectoris    CAD (coronary artery disease)    08/2016 DES to RCA, also with history of LAD stenting.    Chronic diastolic (congestive) heart failure (HCC)    Chronic kidney disease, stage 2 (mild)    Chronic systolic (congestive) heart failure (HCC)    Contrast dye induced nephropathy, possible 09/03/2020   Diabetes (HCC)    Diastolic CHF (HCC)    Gastro-esophageal reflux disease without esophagitis    Hyperlipidemia    Hypertension    Hypertensive heart disease with heart failure (HCC)    Hypoxemia    Idiopathic gout, multiple sites    Localized edema    Low back pain    Mixed hyperlipidemia    Mixed incontinence    Morbid (severe) obesity due to excess calories (HCC)    Non-ST elevation (NSTEMI) myocardial infarction ([place]) 09/03/2020   OSA (obstructive sleep apnea) 09/03/2020   Other specified hypothyroidism    Primary insomnia    Type 2 diabetes mellitus with complication, without long-term current use of insulin ([place]) 09/03/2020   Type 2 diabetes mellitus with diabetic nephropathy (HCC)     Current Outpatient Medications  Medication Sig Dispense Refill    allopurinol (ZYLOPRIM) 100 MG tablet Take 1 tablet (100 mg total) by mouth daily. 90 tablet 0   aspirin EC 81 MG EC tablet Take 1 tablet (81 mg total) by mouth daily. Swallow whole. 90 tablet 3   BD PEN NEEDLE NANO 2ND GEN 32G X 4 MM MISC 4 (four) times daily.     carvedilol (COREG) 12.5 MG tablet Take 1 tablet (12.5 mg total) by mouth 2 (two) times daily. 60 tablet 11   Cholecalciferol (VITAMIN D) 50 MCG (2000 UT) tablet Take 2,000 Units by mouth daily. Take one daily     clopidogrel (PLAVIX) 75 MG tablet Take 1 tablet (75 mg total) by mouth at bedtime. 90 tablet 3   Cyanocobalamin 3000 MCG CAPS Take 2 capsules by mouth daily. Patient purchased 3000 mcg gummies and taking 2 gummies daily     EPINEPHrine 0.3 mg/0.3 mL IJ SOAJ injection Use as directed for life-threatening allergic reaction. 4 Device 3   Evolocumab (REPATHA SURECLICK) 734 MG/ML SOAJ  Inject 1 Dose into the skin every 14 (fourteen) days. 6 mL 3   ezetimibe (ZETIA) 10 MG tablet Take 1 tablet (10 mg total) by mouth daily. 90 tablet 2   famotidine (PEPCID) 20 MG tablet Take 1 tablet (20 mg total) by mouth daily. 30 tablet 5   ferrous sulfate 325 (65 FE) MG tablet Take 325 mg by mouth 2 (two) times daily with a meal.     hydrALAZINE (APRESOLINE) 25 MG tablet Take 1 tablet (25 mg total) by mouth 3 (three) times daily. 270 tablet 0   HYDROcodone-acetaminophen (NORCO) 7.5-325 MG tablet Take 1 tablet by mouth 3 (three) times daily as needed for moderate pain. 90 tablet 0   insulin aspart (NOVOLOG) 100 UNIT/ML injection Inject 2-10 Units into the skin 3 (three) times daily before meals.     isosorbide mononitrate (IMDUR) 30 MG 24 hr tablet Take 1 tablet (30 mg total) by mouth daily. 90 tablet 3   lactulose (CHRONULAC) 10 GM/15ML solution Take 15 mLs (10 g total) by mouth daily as needed for mild constipation. 236 mL 0   latanoprost (XALATAN) 0.005 % ophthalmic solution Place 1 drop into both eyes at bedtime.     LEVEMIR FLEXTOUCH 100 UNIT/ML  FlexPen Inject 50 Units into the skin daily.     LORazepam (ATIVAN) 0.5 MG tablet Take 1 tablet (0.5 mg total) by mouth daily as needed for anxiety. 30 tablet 2   magnesium oxide (MAG-OX) 400 MG tablet Take 2 tablets (800 mg total) by mouth daily. Take 2 (400 mg) daily=800 mg 60 tablet 1   nitroGLYCERIN (NITROSTAT) 0.4 MG SL tablet Place 1 tablet (0.4 mg total) under the tongue every 5 (five) minutes as needed for chest pain. 15 tablet 2   Omega-3 Fatty Acids (FISH OIL PO) Take 1 capsule by mouth daily.     OXYGEN Inhale 2 L into the lungs at bedtime.     pantoprazole (PROTONIX) 40 MG tablet Take 1 tablet (40 mg total) by mouth daily. 30 tablet 3   potassium chloride SA (KLOR-CON) 20 MEQ tablet Take 1 tablet (20 mEq total) by mouth daily. 90 tablet 3   Probiotic CAPS Take 1 capsule by mouth daily.     promethazine (PHENERGAN) 25 MG tablet Take 1 tablet (25 mg total) by mouth every 6 (six) hours as needed for nausea or vomiting. 90 tablet 1   torsemide (DEMADEX) 20 MG tablet Take 2.5 tablets (50 mg total) by mouth daily. 75 tablet 11   No current facility-administered medications for this visit.   Allergies  Allergen Reactions   Naproxen Hives and Rash   Celecoxib Other (See Comments)    Stomach pain   Aspirin Other (See Comments)    CAN NOT TAKE DUE TO ULCERS (documented previously) but takes every day currently (as of 09/29/20) without reported issues   Glucosamine Hives, Swelling and Other (See Comments)    Angioedema    Iron     IV iron transfusion - hives - Feb 2022 hospitalization   Metoclopramide Other (See Comments)    Facial twitching and stuttering   Metolazone Other (See Comments)    Acute renal failure   Shellfish-Derived Products Hives, Swelling and Other (See Comments)    Angioedema    Tramadol Nausea And Vomiting        Social History   Socioeconomic History   Marital status: Married    Spouse name: Not on file   Number of children: Not on file  Years of  education: Not on file   Highest education level: Not on file  Occupational History   Not on file  Tobacco Use   Smoking status: Former   Smokeless tobacco: Never  Vaping Use   Vaping Use: Never used  Substance and Sexual Activity   Alcohol use: Not Currently   Drug use: Not Currently   Sexual activity: Not on file  Other Topics Concern   Not on file  Social History Narrative   Not on file   Social Determinants of Health   Financial Resource Strain: Low Risk    Difficulty of Paying Living Expenses: Not very hard  Food Insecurity: No Food Insecurity   Worried About Running Out of Food in the Last Year: Never true   Ran Out of Food in the Last Year: Never true  Transportation Needs: No Transportation Needs   Lack of Transportation (Medical): No   Lack of Transportation (Non-Medical): No  Physical Activity: Not on file  Stress: Not on file  Social Connections: Not on file  Intimate Partner Violence: Not on file   Family History  Problem Relation Age of Onset   Hypertension Father    Coronary artery disease Mother    Hypertension Mother    Hyperlipidemia Mother    Diabetes type II Mother    Breast cancer Mother    Lung cancer Mother    Bipolar disorder Other    Depression Other    Schizophrenia Other    There were no vitals taken for this visit.  Wt Readings from Last 3 Encounters:  05/05/21 105.1 kg (231 lb 12.8 oz)  04/29/21 98.9 kg (218 lb 0.6 oz)  04/24/21 110.4 kg (243 lb 6.4 oz)   PHYSICAL EXAM: General:  Comfortable sitting in wheelchair. Husband and daughter present. HEENT: normal Neck: supple. JVP 9 cm sitting in chair. Carotids 2+ bilat; no bruits. No lymphadenopathy or thryomegaly appreciated. Cor: PMI nondisplaced. Regular rate & rhythm. No rubs, gallops, 3/6 holosystolic MR murmur Lungs: clear Abdomen: soft, nontender, nondistended. No hepatosplenomegaly. No bruits or masses. Good bowel sounds. Extremities: no cyanosis, clubbing, rash, 1 - 2+  edema Neuro: alert & orientedx3, cranial nerves grossly intact. moves all 4 extremities w/o difficulty. Affect tearful   ECG:  SR 68 bpm  CardioMEMs: Goal dPAP: 10  Today's dPAP: 12   ASSESSMENT & PLAN: 1. Acute on Chronic Diastolic Heart Failure - Echo 08/2020 EF 55-60%  - Echo (3/22): EF 55-60%, Grade II DD, normal RV - Echo (6/22): EF 60%, mod MR/TR (per chart review at [facility]). - RHC (10/22): mild to moderate elevated filling pressures, large v-waves PCWP tracing - TEE (10/22): EF 55-60%, basal inferior akinesis, severe central MR. - Recent admit 10/22 with a/c CHF, enrolled in FASTR trial and diuresed 15 lbs. Severe MR likely contributing to decompensation. See below. - NYHA IIIa. Volume appears slightly up. Today's dPAP 12, goal 10. Will have her increase Torsemide from 50 mg to 80 mg tomorrow am then decrease to 60 mg daily.  - Continue carvedilol 12.5 mg bid. - Continue hydralazine 25 mg tid + Imdur 30 mg daily. - No SGLT2i with T1DM. - CMP/Magnesium/CBC today   2. CAD - Known CAD with prior stents to mid LAD and pRCA.  - NSTEMI (2/22). Cath deferred due to AKI. Treated medically.  - NSTEMI (7/22)-->S/p PCI/DES to mid Cx, balloon angioplasty to OM1  - One episode chest pressure last 10 min this past week with no recurrence. She's had similar  symptoms 2/2 volume overload during recent admit. Discussed when to seek urgent evaluation for symptoms concerning for ACS. - Ideally continue ASA + Plavix for a minimum of 12 months post DES. Had 2 nosebleeds this past week. Call with recurrent bleeding. CBC today. - Lipids under good control w/ Repatha + Zetia (LDL 66 7/22). Followed by Dr. Debara Pickett.    3. AKI on CKD IIIb - prior h/o CIN following LHC - SCr baseline 1.6-1.8 with multiple AKIs over the last year, Scr up to 3.2 during admission earlier this month. Scr 2.94 10/15. - Followed by Nephrology in [facility] - BMET today  4. Mitral regurgitation - Severe central MR on TEE on  10/14.   - Likely contributing to #1 - She is scheduled for TEER of the mitral valve with [name] 11/03   5. OSA -  CPAP qhs.    6. DM1 - Hgb A1c 6.6. - No SGLT2i   7. Chronic Anemia - Baseline Hgb 8-9 range, likely 2/2 renal disease.  - Hx severe epistaxis and ABLA requiring transfusion - Last hgb 9.1 on 10/13 - Receives Procrit through nephrology - Hx rash with IV iron - CBC today given recurrent epistaxis  8. Dysuria - Started yesterday -? UTI -She was able to get f/u with PCP tomorrow to further evaluate    Follow up 2-3 weeks in APP clinic to reassess volume status and CMET same day as office visit.   Dana Huge, PA-C 05/08/21

## 2021-05-08 NOTE — Addendum Note (Signed)
Encounter addended by: Joette Catching, PA-C on: 05/08/2021 4:35 PM  Actions taken: Clinical Note Signed

## 2021-05-08 NOTE — Chronic Care Management (AMB) (Signed)
Chronic Care Management Pharmacy Assistant   Name: Dana [name]  MRN: [id number] DOB: October 11, 1960  Reason for Encounter: Disease State/ Hypertension   Recent office visits:  12-07-2020 Rip Harbour, NP. Referral placed to Orthopedic surgery.  02-13-2021 Lillard Anes, MD. START lactulose 10 mg as needed.   04-04-2021 [name], MD. Flu vaccine given.  Recent consult visits:  11-18-2020 Biagio Quint, MD (Nephrology). Unable to view encounter.  11-24-2020 Lorna Dibble, [place]. Lab work.  12-15-2020 Leandrew Koyanagi, MD (Orthopedic surgery). XR of right wrist.  12-22-2020 Irven Easterly., MD (Endocrinology). Follow up in 3 months.  12-29-2020 Biagio Quint, MD (Nephrology). START Vitamin D3 2,000 units daily after finishing weekly Vitamin D.  01-04-2021 Bensimhon, Shaune Pascal, MD (Cardiology). START Farxiga 10 mg daily. CHANGE Torsemide 40 mg daily TO 20 mg on Monday, Wednesday and Friday.  03-01-2021 Biagio Quint, MD (Nephrology). Anemia shot given.  03-07-2021 Molli Barrows, NP (Neurology). Follow up.  03-15-2021 Rafael Bihari, FNP [facility]). EKG completed. START Jardiance 10 mg daily. INCREASE Carvedilol 6.25 mg twice daily to 12.5 mg twice daily.  04-28-2021 Bensimhon, Shaune Pascal, MD. Right heart Cath procedure.  05-05-2021 Early Osmond, MD (cardiology). Follow up.  Hospital visits:  Medication Reconciliation was completed by comparing discharge summary, patient's EMR and Pharmacy list, and upon discussion with patient.  Admitted to the hospital on 01-14-2021 due to PEA. Discharge date was 01-20-2021 Discharged from [facility]?Medications Started at [facility] Discharge:?? None  Medication Changes at Hospital Discharge: None  Medications Discontinued at Hospital Discharge: None  Medications that remain the same after Hospital Discharge:??  -All other medications will remain the same.     Medications: Outpatient Encounter Medications as of 05/08/2021  Medication Sig Note   allopurinol (ZYLOPRIM) 100 MG tablet Take 1 tablet (100 mg total) by mouth daily.    aspirin EC 81 MG EC tablet Take 1 tablet (81 mg total) by mouth daily. Swallow whole.    BD PEN NEEDLE NANO 2ND GEN 32G X 4 MM MISC 4 (four) times daily.    carvedilol (COREG) 12.5 MG tablet Take 1 tablet (12.5 mg total) by mouth 2 (two) times daily.    Cholecalciferol (VITAMIN D) 50 MCG (2000 UT) tablet Take 2,000 Units by mouth daily. Take one daily    clopidogrel (PLAVIX) 75 MG tablet Take 1 tablet (75 mg total) by mouth at bedtime.    Cyanocobalamin 3000 MCG CAPS Take 2 capsules by mouth daily. Patient purchased 3000 mcg gummies and taking 2 gummies daily    EPINEPHrine 0.3 mg/0.3 mL IJ SOAJ injection Use as directed for life-threatening allergic reaction.    Evolocumab (REPATHA SURECLICK) 166 MG/ML SOAJ Inject 1 Dose into the skin every 14 (fourteen) days.    ezetimibe (ZETIA) 10 MG tablet Take 1 tablet (10 mg total) by mouth daily.    famotidine (PEPCID) 20 MG tablet Take 1 tablet (20 mg total) by mouth daily.    ferrous sulfate 325 (65 FE) MG tablet Take 325 mg by mouth 2 (two) times daily with a meal.    hydrALAZINE (APRESOLINE) 25 MG tablet Take 1 tablet (25 mg total) by mouth 3 (three) times daily.    HYDROcodone-acetaminophen (NORCO) 7.5-325 MG tablet Take 1 tablet by mouth 3 (three) times daily as needed for moderate pain.    insulin aspart (NOVOLOG) 100 UNIT/ML injection Inject 2-10 Units into the skin 3 (three) times daily before meals.  isosorbide mononitrate (IMDUR) 30 MG 24 hr tablet Take 1 tablet (30 mg total) by mouth daily.    lactulose (CHRONULAC) 10 GM/15ML solution Take 15 mLs (10 g total) by mouth daily as needed for mild constipation.    latanoprost (XALATAN) 0.005 % ophthalmic solution Place 1 drop into both eyes at bedtime.    LEVEMIR FLEXTOUCH 100 UNIT/ML FlexPen Inject 50 Units into the skin  daily. 09/29/2020: Pt had 50 units this morning   LORazepam (ATIVAN) 0.5 MG tablet Take 1 tablet (0.5 mg total) by mouth daily as needed for anxiety.    magnesium oxide (MAG-OX) 400 MG tablet Take 2 tablets (800 mg total) by mouth daily. Take 2 (400 mg) daily=800 mg    nitroGLYCERIN (NITROSTAT) 0.4 MG SL tablet Place 1 tablet (0.4 mg total) under the tongue every 5 (five) minutes as needed for chest pain.    Omega-3 Fatty Acids (FISH OIL PO) Take 1 capsule by mouth daily.    OXYGEN Inhale 2 L into the lungs at bedtime.    pantoprazole (PROTONIX) 40 MG tablet Take 1 tablet (40 mg total) by mouth daily.    potassium chloride SA (KLOR-CON) 20 MEQ tablet Take 1 tablet (20 mEq total) by mouth daily.    Probiotic CAPS Take 1 capsule by mouth daily.    promethazine (PHENERGAN) 25 MG tablet Take 1 tablet (25 mg total) by mouth every 6 (six) hours as needed for nausea or vomiting.    torsemide (DEMADEX) 20 MG tablet Take 2.5 tablets (50 mg total) by mouth daily.    No facility-administered encounter medications on file as of 05/08/2021.    Recent Office Vitals: BP Readings from Last 3 Encounters:  05/05/21 (!) 150/62  04/29/21 (!) 111/51  04/24/21 137/60   Pulse Readings from Last 3 Encounters:  05/05/21 71  04/29/21 70  04/24/21 76    Wt Readings from Last 3 Encounters:  05/05/21 231 lb 12.8 oz (105.1 kg)  04/29/21 218 lb 0.6 oz (98.9 kg)  04/24/21 243 lb 6.4 oz (110.4 kg)     Kidney Function Lab Results  Component Value Date/Time   CREATININE 2.94 (H) 04/29/2021 01:31 AM   CREATININE 3.13 (H) 04/28/2021 12:33 AM   GFRNONAA 18 (L) 04/29/2021 01:31 AM   GFRAA 43 (L) 06/28/2020 10:03 AM    BMP Latest Ref Rng & Units 04/29/2021 04/28/2021 04/27/2021  Glucose 70 - 99 mg/dL 160(H) 273(H) 144(H)  BUN 6 - 20 mg/dL 97(H) 96(H) 87(H)  Creatinine 0.44 - 1.00 mg/dL 2.94(H) 3.13(H) 2.92(H)  BUN/Creat Ratio 9 - 23 - - -  Sodium 135 - 145 mmol/L 138 134(L) 137  Potassium 3.5 - 5.1 mmol/L 4.0  4.4 4.2  Chloride 98 - 111 mmol/L 102 97(L) 98  CO2 22 - 32 mmol/L 27 24 26   Calcium 8.9 - 10.3 mg/dL 9.0 9.0 9.1     05-08-2021: 1st attempt left VM 05-09-2021: 2nd attempt left VM 05-11-2021: 3rd attempt left VM   Care Gaps: Last annual wellness visit?None Tdap overdue Shingrix overdue Yearly mammogram overdue Last completed 09-25-2019 Yearly foot exam overdue last completed 03-23-2020   Star Rating Drugs: None  Jeannette How [facility] Clinical Pharmacist Assistant [phone]

## 2021-05-08 NOTE — Research (Addendum)
   SITE #  01         SUBJECT #    [id number]        SUBJECT Initials:  WGS  7-DAY FOLLOW UP VISIT  Discharge Date / time::  _15 / _OCT_/ __2022   at _13:24___      []   Time unknown        DD/MMM/YYYY        HH;MM  Date of Follow-up Visit: _24 / _OCT_/ _2022   at _14:00____      []   Time unknown        DD/MMM/YYYY        HH;MM   Was there any change in subjects's medications?  [x]  YES  []   NO     Update:__increased Torsemide to 60 mg____  Did the subject experience any NEW adverse events?  []  YES  [x]   NO     Update:____  Was Auditory Testing performed at this visit?  [x]   YES  []   NO      If No, provide reason: ____   COMMENTS:   Cardiomems reading of 12 today per HF clinic PA. Patient did report having 2 nosebleeds last week but these were similar to ones she has had in the past. She will let us know if they worsen or become more frequent.    Estimated pounds of fluid overload:  ___12_ lbs  Is Subject experiencing Shortness of Breath?  [x]  Yes   []   No  Dyspnea VAS: (10 being the worst)   []  1 []  2 []  3 []  4 [x]  5 []  6 []  7 [] 8 []  9 []  10  Is Subject experiencing Orthopnea?  [x]  Yes    []  No  Is there evidence of Rales?  []  Yes  [x]   No    If Yes, Characterize:   []  Rhonchi     []  Crackles     []  Wheezing     []  Stridor Ascites? []   Yes  [x]   No  Leg Edema?  [x]  Yes    []  No         If yes, grade of edema:  [x]  1+  []  2+  []  3+  []  4+  Sacral Edema?  []  Yes   [x]  No        If yes, grade of edema:    []  1+  []  2+  []  3+  []  4+  Jugular Vein Distention (JVD)?:  [x]  Yes  []   No        If yes, provide measurement:   __9__ cm  NYHA Classification:  []  I   []  II  [x]  III  []   IV  []   N/A  Has subject expressed presence of thirst?  []  Yes  [x]   No       If Yes, rate of thirst level (10 being the worst): []  1 []  2 []  3 []  4 []  5 []  6 []  7 [] 8 []  9 []  10    Form is based on Pages 35 of FASTR Trial  eCRFs    Protocol RCV-0006

## 2021-05-09 ENCOUNTER — Ambulatory Visit (INDEPENDENT_AMBULATORY_CARE_PROVIDER_SITE_OTHER): Payer: HMO

## 2021-05-09 ENCOUNTER — Encounter: Payer: Self-pay | Admitting: Nurse Practitioner

## 2021-05-09 ENCOUNTER — Other Ambulatory Visit: Payer: Self-pay

## 2021-05-09 ENCOUNTER — Ambulatory Visit (INDEPENDENT_AMBULATORY_CARE_PROVIDER_SITE_OTHER): Payer: HMO | Admitting: Nurse Practitioner

## 2021-05-09 VITALS — BP 134/58 | HR 82 | Temp 96.7°F | Ht 64.5 in | Wt 233.0 lb

## 2021-05-09 DIAGNOSIS — N3001 Acute cystitis with hematuria: Secondary | ICD-10-CM

## 2021-05-09 DIAGNOSIS — R300 Dysuria: Secondary | ICD-10-CM | POA: Diagnosis not present

## 2021-05-09 DIAGNOSIS — I340 Nonrheumatic mitral (valve) insufficiency: Secondary | ICD-10-CM

## 2021-05-09 DIAGNOSIS — N1832 Chronic kidney disease, stage 3b: Secondary | ICD-10-CM | POA: Diagnosis not present

## 2021-05-09 DIAGNOSIS — Z23 Encounter for immunization: Secondary | ICD-10-CM

## 2021-05-09 LAB — CBC WITH DIFFERENTIAL/PLATELET
Basophils Absolute: 0.1 10*3/uL (ref 0.0–0.2)
Basos: 1 %
EOS (ABSOLUTE): 0.4 10*3/uL (ref 0.0–0.4)
Eos: 3 %
Hematocrit: 28.3 % — ABNORMAL LOW (ref 34.0–46.6)
Hemoglobin: 9 g/dL — ABNORMAL LOW (ref 11.1–15.9)
Immature Grans (Abs): 0 10*3/uL (ref 0.0–0.1)
Immature Granulocytes: 0 %
Lymphocytes Absolute: 1.9 10*3/uL (ref 0.7–3.1)
Lymphs: 18 %
MCH: 26.9 pg (ref 26.6–33.0)
MCHC: 31.8 g/dL (ref 31.5–35.7)
MCV: 85 fL (ref 79–97)
Monocytes Absolute: 0.9 10*3/uL (ref 0.1–0.9)
Monocytes: 8 %
Neutrophils Absolute: 7.6 10*3/uL — ABNORMAL HIGH (ref 1.4–7.0)
Neutrophils: 70 %
Platelets: 313 10*3/uL (ref 150–450)
RBC: 3.35 x10E6/uL — ABNORMAL LOW (ref 3.77–5.28)
RDW: 14 % (ref 11.7–15.4)
WBC: 10.8 10*3/uL (ref 3.4–10.8)

## 2021-05-09 LAB — COMPREHENSIVE METABOLIC PANEL
ALT: 11 IU/L (ref 0–32)
AST: 14 IU/L (ref 0–40)
Albumin/Globulin Ratio: 1.1 — ABNORMAL LOW (ref 1.2–2.2)
Albumin: 4.1 g/dL (ref 3.8–4.9)
Alkaline Phosphatase: 106 IU/L (ref 44–121)
BUN/Creatinine Ratio: 28 — ABNORMAL HIGH (ref 9–23)
BUN: 67 mg/dL — ABNORMAL HIGH (ref 6–24)
Bilirubin Total: 0.4 mg/dL (ref 0.0–1.2)
CO2: 21 mmol/L (ref 20–29)
Calcium: 9.3 mg/dL (ref 8.7–10.2)
Chloride: 98 mmol/L (ref 96–106)
Creatinine, Ser: 2.38 mg/dL — ABNORMAL HIGH (ref 0.57–1.00)
Globulin, Total: 3.7 g/dL (ref 1.5–4.5)
Glucose: 92 mg/dL (ref 70–99)
Potassium: 4.6 mmol/L (ref 3.5–5.2)
Sodium: 136 mmol/L (ref 134–144)
Total Protein: 7.8 g/dL (ref 6.0–8.5)
eGFR: 23 mL/min/{1.73_m2} — ABNORMAL LOW (ref >59)

## 2021-05-09 LAB — POCT URINALYSIS DIPSTICK
Bilirubin, UA: NEGATIVE
Blood, UA: POSITIVE
Glucose, UA: NEGATIVE
Ketones, UA: NEGATIVE
Nitrite, UA: NEGATIVE
Protein, UA: POSITIVE — AB
Spec Grav, UA: 1.015 (ref 1.010–1.025)
Urobilinogen, UA: 0.2 E.U./dL
pH, UA: 6 (ref 5.0–8.0)

## 2021-05-09 LAB — MAGNESIUM: Magnesium: 2.3 mg/dL (ref 1.6–2.3)

## 2021-05-09 MED ORDER — NITROFURANTOIN MONOHYD MACRO 100 MG PO CAPS: 100.0000 mg | ORAL_CAPSULE | Freq: Two times a day (BID) | ORAL | 0 refills | Status: DC

## 2021-05-09 NOTE — Progress Notes (Signed)
   Covid-19 Vaccination Clinic  Name:  Dana [name]    MRN: [id number] DOB: [birth year]  05/09/2021  [name] was observed post Covid-19 immunization for 15 minutes without incident. She was provided with Vaccine Information Sheet and instruction to access the V-Safe system.   [name] was instructed to call [phone] with any severe reactions post vaccine: Difficulty breathing  Swelling of face and throat  A fast heartbeat  A bad rash all over body  Dizziness and weakness   Immunizations Administered     Name Date Dose VIS Date Route   Moderna Covid-19 vaccine Bivalent Booster 05/09/2021 11:26 AM 0.5 mL 02/25/2021 Intramuscular   Manufacturer: Moderna   Lot: [id number]   [place]: [id number]

## 2021-05-09 NOTE — Progress Notes (Signed)
Acute Office Visit  Subjective:    Patient ID: Dana [name], female    DOB: 24-Feb-1961, 60 y.o.   MRN: [id number]  Chief Complaint  Patient presents with   Urinary Tract Infection    HPI: Patient is in today for Urinary symptoms  She reports new onset cloudy malodorous urine and dysuria. The current episode started a few days ago and is worsening. Patient states symptoms are 7/10 in intensity, occurring constantly. She  has not been recently treated for similar symptoms. She was hospitalized with CHF exacerbation recently. States she had an indwelling urinary catheter while hospitalized. She is scheduled to undergo heart valve surgery next week.   Associated symptoms: No abdominal pain Yes back pain  No chills No constipation  No cramping No diarrhea  No discharge No fever  No hematuria No nausea  No vomiting      Past Medical History:  Diagnosis Date   Acquired absence of left great toe (HCC)    Acquired absence of other left toe(s) ([place])    Atherosclerotic heart disease of native coronary artery without angina pectoris    CAD (coronary artery disease)    08/2016 DES to RCA, also with history of LAD stenting.    Chronic diastolic (congestive) heart failure (HCC)    Chronic kidney disease, stage 2 (mild)    Chronic systolic (congestive) heart failure (HCC)    Contrast dye induced nephropathy, possible 09/03/2020   Diabetes (HCC)    Diastolic CHF (HCC)    Gastro-esophageal reflux disease without esophagitis    Hyperlipidemia    Hypertension    Hypertensive heart disease with heart failure (HCC)    Hypoxemia    Idiopathic gout, multiple sites    Localized edema    Low back pain    Mixed hyperlipidemia    Mixed incontinence    Morbid (severe) obesity due to excess calories (HCC)    Non-ST elevation (NSTEMI) myocardial infarction ([place]) 09/03/2020   OSA (obstructive sleep apnea) 09/03/2020   Other specified hypothyroidism    Primary insomnia    Type 2 diabetes  mellitus with complication, without long-term current use of insulin ([place]) 09/03/2020   Type 2 diabetes mellitus with diabetic nephropathy ([place])     Past Surgical History:  Procedure Laterality Date   ABDOMINAL HYSTERECTOMY     Partial- Still has both ovaries   CARDIOVASCULAR STRESS TEST  08/13/2014   Nuclear; Normal   CARPAL TUNNEL RELEASE     CATARACT EXTRACTION, BILATERAL     CESAREAN SECTION     CHOLECYSTECTOMY     CORONARY ANGIOPLASTY WITH STENT PLACEMENT     3 blockages/ 1 stent   CORONARY STENT INTERVENTION N/A 09/29/2020   Procedure: CORONARY STENT INTERVENTION;  Surgeon: Sherren Mocha, MD;  Location: [facility];  Service: Cardiovascular;  Laterality: N/A;  CFX   EYE SURGERY     LEFT HEART CATH AND CORONARY ANGIOGRAPHY N/A 09/29/2020   Procedure: LEFT HEART CATH AND CORONARY ANGIOGRAPHY;  Surgeon: Sherren Mocha, MD;  Location: [facility];  Service: Cardiovascular;  Laterality: N/A;   RIGHT HEART CATH N/A 04/27/2021   Procedure: RIGHT HEART CATH;  Surgeon: Jolaine Artist, MD;  Location: [facility];  Service: Cardiovascular;  Laterality: N/A;   RIGHT/LEFT HEART CATH AND CORONARY ANGIOGRAPHY N/A 01/07/2017   Procedure: Right/Left Heart Cath and Coronary Angiography;  Surgeon: Larey Dresser, MD;  Location: [facility];  Service: Cardiovascular;  Laterality: N/A;  ROTATOR CUFF REPAIR     TEE WITHOUT CARDIOVERSION N/A 04/28/2021   Procedure: TRANSESOPHAGEAL ECHOCARDIOGRAM (TEE);  Surgeon: Jolaine Artist, MD;  Location: [facility];  Service: Cardiovascular;  Laterality: N/A;   TOE AMPUTATION Left    US ECHOCARDIOGRAPHY  05/2017   Normal    Family History  Problem Relation Age of Onset   Hypertension Father    Coronary artery disease Mother    Hypertension Mother    Hyperlipidemia Mother    Diabetes type II Mother    Breast cancer Mother    Lung cancer Mother    Bipolar disorder Other    Depression Other    Schizophrenia  Other     Social History   Socioeconomic History   Marital status: Married    Spouse name: Not on file   Number of children: Not on file   Years of education: Not on file   Highest education level: Not on file  Occupational History   Not on file  Tobacco Use   Smoking status: Former   Smokeless tobacco: Never  Vaping Use   Vaping Use: Never used  Substance and Sexual Activity   Alcohol use: Not Currently   Drug use: Not Currently   Sexual activity: Not on file  Other Topics Concern   Not on file  Social History Narrative   Not on file   Social Determinants of Health   Financial Resource Strain: Low Risk    Difficulty of Paying Living Expenses: Not very hard  Food Insecurity: No Food Insecurity   Worried About Running Out of Food in the Last Year: Never true   Ran Out of Food in the Last Year: Never true  Transportation Needs: No Transportation Needs   Lack of Transportation (Medical): No   Lack of Transportation (Non-Medical): No  Physical Activity: Not on file  Stress: Not on file  Social Connections: Not on file  Intimate Partner Violence: Not on file    Outpatient Medications Prior to Visit  Medication Sig Dispense Refill   allopurinol (ZYLOPRIM) 100 MG tablet Take 1 tablet (100 mg total) by mouth daily. 90 tablet 0   aspirin EC 81 MG EC tablet Take 1 tablet (81 mg total) by mouth daily. Swallow whole. 90 tablet 3   BD PEN NEEDLE NANO 2ND GEN 32G X 4 MM MISC 4 (four) times daily.     carvedilol (COREG) 12.5 MG tablet Take 1 tablet (12.5 mg total) by mouth 2 (two) times daily. 60 tablet 11   Cholecalciferol (VITAMIN D) 50 MCG (2000 UT) tablet Take 2,000 Units by mouth daily. Take one daily     clopidogrel (PLAVIX) 75 MG tablet Take 1 tablet (75 mg total) by mouth at bedtime. 90 tablet 3   Cyanocobalamin 3000 MCG CAPS Take 2 capsules by mouth daily. Patient purchased 3000 mcg gummies and taking 2 gummies daily     EPINEPHrine 0.3 mg/0.3 mL IJ SOAJ injection Use as  directed for life-threatening allergic reaction. 4 Device 3   Evolocumab (REPATHA SURECLICK) 081 MG/ML SOAJ Inject 1 Dose into the skin every 14 (fourteen) days. 6 mL 3   ezetimibe (ZETIA) 10 MG tablet Take 1 tablet (10 mg total) by mouth daily. 90 tablet 2   famotidine (PEPCID) 20 MG tablet Take 1 tablet (20 mg total) by mouth daily. 30 tablet 5   ferrous sulfate 325 (65 FE) MG tablet Take 325 mg by mouth 2 (two) times daily with a meal.  hydrALAZINE (APRESOLINE) 25 MG tablet Take 1 tablet (25 mg total) by mouth 3 (three) times daily. 270 tablet 0   HYDROcodone-acetaminophen (NORCO) 7.5-325 MG tablet Take 1 tablet by mouth 3 (three) times daily as needed for moderate pain. 90 tablet 0   insulin aspart (NOVOLOG) 100 UNIT/ML injection Inject 2-10 Units into the skin 3 (three) times daily before meals.     isosorbide mononitrate (IMDUR) 30 MG 24 hr tablet Take 1 tablet (30 mg total) by mouth daily. 90 tablet 3   lactulose (CHRONULAC) 10 GM/15ML solution Take 15 mLs (10 g total) by mouth daily as needed for mild constipation. 236 mL 0   latanoprost (XALATAN) 0.005 % ophthalmic solution Place 1 drop into both eyes at bedtime.     LEVEMIR FLEXTOUCH 100 UNIT/ML FlexPen Inject 50 Units into the skin daily.     LORazepam (ATIVAN) 0.5 MG tablet Take 1 tablet (0.5 mg total) by mouth daily as needed for anxiety. 30 tablet 2   magnesium oxide (MAG-OX) 400 MG tablet Take 2 tablets (800 mg total) by mouth daily. Take 2 (400 mg) daily=800 mg 60 tablet 1   nitroGLYCERIN (NITROSTAT) 0.4 MG SL tablet Place 1 tablet (0.4 mg total) under the tongue every 5 (five) minutes as needed for chest pain. 15 tablet 2   Omega-3 Fatty Acids (FISH OIL PO) Take 1 capsule by mouth daily.     OXYGEN Inhale 2 L into the lungs at bedtime.     pantoprazole (PROTONIX) 40 MG tablet Take 1 tablet (40 mg total) by mouth daily. 30 tablet 3   potassium chloride SA (KLOR-CON) 20 MEQ tablet Take 1 tablet (20 mEq total) by mouth daily. 90  tablet 3   Probiotic CAPS Take 1 capsule by mouth daily.     promethazine (PHENERGAN) 25 MG tablet Take 1 tablet (25 mg total) by mouth every 6 (six) hours as needed for nausea or vomiting. 90 tablet 1   torsemide (DEMADEX) 20 MG tablet Take 3 tablets (60 mg total) by mouth daily. 90 tablet 3   No facility-administered medications prior to visit.    Allergies  Allergen Reactions   Naproxen Hives and Rash   Celecoxib Other (See Comments)    Stomach pain   Aspirin Other (See Comments)    CAN NOT TAKE DUE TO ULCERS (documented previously) but takes every day currently (as of 09/29/20) without reported issues   Glucosamine Hives, Swelling and Other (See Comments)    Angioedema    Iron     IV iron transfusion - hives - Feb 2022 hospitalization   Metoclopramide Other (See Comments)    Facial twitching and stuttering   Metolazone Other (See Comments)    Acute renal failure   Shellfish-Derived Products Hives, Swelling and Other (See Comments)    Angioedema    Tramadol Nausea And Vomiting         Review of Systems  Constitutional:  Negative for chills and fever.  HENT:  Negative for congestion and sore throat.   Respiratory:  Negative for cough and shortness of breath.   Cardiovascular:  Negative for chest pain.  Gastrointestinal:  Negative for abdominal pain and nausea.  Genitourinary:  Positive for difficulty urinating and dysuria. Negative for flank pain, frequency and urgency.  Musculoskeletal:  Positive for back pain (Lower).      Objective:    Physical Exam Vitals reviewed.  Skin:    General: Skin is warm.     Capillary Refill: Capillary refill takes  less than 2 seconds.     Coloration: Skin is pale.  Neurological:     General: No focal deficit present.     Mental Status: She is alert and oriented to person, place, and time.  Psychiatric:        Mood and Affect: Mood normal.        Behavior: Behavior normal.    Pulse 82   Temp (!) 96.7 F (35.9 C)   Ht 5' 4.5"  (1.638 m)   Wt 233 lb (105.7 kg)   SpO2 98%   BMI 39.38 kg/m  BP (!) 134/58   Pulse 82   Temp (!) 96.7 F (35.9 C)   Ht 5' 4.5" (1.638 m)   Wt 233 lb (105.7 kg)   SpO2 98%   BMI 39.38 kg/m    Wt Readings from Last 3 Encounters:  05/09/21 233 lb (105.7 kg)  05/08/21 231 lb 3.2 oz (104.9 kg)  05/05/21 231 lb 12.8 oz (105.1 kg)    Health Maintenance Due  Topic Date Due   COVID-19 Vaccine (1) 08/05/1961   TETANUS/TDAP  Never done   Zoster Vaccines- Shingrix (1 of 2) Never done   MAMMOGRAM  09/24/2020   FOOT EXAM  03/23/2021       Lab Results  Component Value Date   TSH 4.014 04/24/2021   Lab Results  Component Value Date   WBC 10.8 05/08/2021   HGB 9.0 (L) 05/08/2021   HCT 28.3 (L) 05/08/2021   MCV 85 05/08/2021   PLT 313 05/08/2021   Lab Results  Component Value Date   NA 136 05/08/2021   K 4.6 05/08/2021   CO2 21 05/08/2021   GLUCOSE 92 05/08/2021   BUN 67 (H) 05/08/2021   CREATININE 2.38 (H) 05/08/2021   BILITOT 0.4 05/08/2021   ALKPHOS 106 05/08/2021   AST 14 05/08/2021   ALT 11 05/08/2021   PROT 7.8 05/08/2021   ALBUMIN 4.1 05/08/2021   CALCIUM 9.3 05/08/2021   ANIONGAP 9 04/29/2021   EGFR 23 (L) 05/08/2021   Lab Results  Component Value Date   CHOL 175 10/17/2020   Lab Results  Component Value Date   HDL 43 10/17/2020   Lab Results  Component Value Date   LDLCALC 109 (H) 10/17/2020   Lab Results  Component Value Date   TRIG 130 10/17/2020   Lab Results  Component Value Date   CHOLHDL 4.1 10/17/2020   Lab Results  Component Value Date   HGBA1C 6.6 (H) 04/25/2021       Assessment & Plan:    1. Acute cystitis with hematuria - nitrofurantoin, macrocrystal-monohydrate, (MACROBID) 100 MG capsule; Take 1 capsule (100 mg total) by mouth 2 (two) times daily.  Dispense: 14 capsule; Refill: 0  2. Dysuria - POCT urinalysis dipstick - Urine Culture    Take Macrobid 100 mg twice daily for 7 days Notify office if you experience  worsening symptoms of fever, chills, decreased urine output Seek emergency medical care for severe or concerning symptoms Follow-up as needed    Follow-up: PRN  An After Visit Summary was printed and given to the patient.  I, Rip Harbour, NP, have reviewed all documentation for this visit. The documentation on 05/09/21 for the exam, diagnosis, procedures, and orders are all accurate and complete.   Rip Harbour, NP [place] [phone]

## 2021-05-09 NOTE — Patient Instructions (Signed)
Take Macrobid 100 mg twice daily for 7 days Notify office if you experience worsening symptoms of fever, chills, decreased urine output Seek emergency medical care for severe or concerning symptoms Follow-up as needed  A urinary tract infection (UTI) is an infection of any part of the urinary tract. The urinary tract includes: The kidneys. The ureters. The bladder. The urethra. These organs make, store, and get rid of pee (urine) in the body. What are the causes? This infection is caused by germs (bacteria) in your genital area. These germs grow and cause swelling (inflammation) of your urinary tract. What increases the risk? The following factors may make you more likely to develop this condition: Using a small, thin tube (catheter) to drain pee. Not being able to control when you pee or poop (incontinence). Being female. If you are female, these things can increase the risk: Using these methods to prevent pregnancy: A medicine that kills sperm (spermicide). A device that blocks sperm (diaphragm). Having low levels of a female hormone (estrogen). Being pregnant. You are more likely to develop this condition if: You have genes that add to your risk. You are sexually active. You take antibiotic medicines. You have trouble peeing because of: A prostate that is bigger than normal, if you are female. A blockage in the part of your body that drains pee from the bladder. A kidney stone. A nerve condition that affects your bladder. Not getting enough to drink. Not peeing often enough. You have other conditions, such as: Diabetes. A weak disease-fighting system (immune system). Sickle cell disease. Gout. Injury of the spine. What are the signs or symptoms? Symptoms of this condition include: Needing to pee right away. Peeing small amounts often. Pain or burning when peeing. Blood in the pee. Pee that smells bad or not like normal. Trouble peeing. Pee that is cloudy. Fluid  coming from the vagina, if you are female. Pain in the belly or lower back. Other symptoms include: Vomiting. Not feeling hungry. Feeling mixed up (confused). This may be the first symptom in older adults. Being tired and grouchy (irritable). A fever. Watery poop (diarrhea). How is this treated? Taking antibiotic medicine. Taking other medicines. Drinking enough water. In some cases, you may need to see a specialist. Follow these instructions at home: Medicines Take over-the-counter and prescription medicines only as told by your doctor. If you were prescribed an antibiotic medicine, take it as told by your doctor. Do not stop taking it even if you start to feel better. General instructions Make sure you: Pee until your bladder is empty. Do not hold pee for a long time. Empty your bladder after sex. Wipe from front to back after peeing or pooping if you are a female. Use each tissue one time when you wipe. Drink enough fluid to keep your pee pale yellow. Keep all follow-up visits. Contact a doctor if: You do not get better after 1-2 days. Your symptoms go away and then come back. Get help right away if: You have very bad back pain. You have very bad pain in your lower belly. You have a fever. You have chills. You feeling like you will vomit or you vomit. Summary A urinary tract infection (UTI) is an infection of any part of the urinary tract. This condition is caused by germs in your genital area. There are many risk factors for a UTI. Treatment includes antibiotic medicines. Drink enough fluid to keep your pee pale yellow. This information is not intended to replace advice given  to you by your health care provider. Make sure you discuss any questions you have with your health care provider. Document Revised: 02/12/2020 Document Reviewed: 02/12/2020 Elsevier Patient Education  [place].

## 2021-05-10 LAB — RENAL FUNCTION PANEL
Albumin: 4.1 g/dL (ref 3.8–4.9)
BUN/Creatinine Ratio: 28 — ABNORMAL HIGH (ref 9–23)
BUN: 70 mg/dL — ABNORMAL HIGH (ref 6–24)
CO2: 24 mmol/L (ref 20–29)
Calcium: 9.3 mg/dL (ref 8.7–10.2)
Chloride: 101 mmol/L (ref 96–106)
Creatinine, Ser: 2.46 mg/dL — ABNORMAL HIGH (ref 0.57–1.00)
Glucose: 97 mg/dL (ref 70–99)
Phosphorus: 5.5 mg/dL — ABNORMAL HIGH (ref 3.0–4.3)
Potassium: 4 mmol/L (ref 3.5–5.2)
Sodium: 141 mmol/L (ref 134–144)
eGFR: 22 mL/min/{1.73_m2} — ABNORMAL LOW (ref >59)

## 2021-05-15 NOTE — Pre-Procedure Instructions (Addendum)
Surgical Instructions    Your procedure is scheduled on Thursday 05/18/21.   Report to [facility] Main Entrance "A" at 05:45 A.M., then check in with the Admitting office.  Call this number if you have problems the morning of surgery:  [phone]   If you have any questions prior to your surgery date call [phone]: Open Monday-Friday 8am-4pm    Remember:  Do not eat after midnight the night before your surgery      Please follow instructions in regards to diabetic medications per pre-admission testing guidelines. Continue taking all other medications without change through the day before surgery. On the morning of surgery you may take all of your morning medications with a few sips of water, except do not take demadex (torsemide).  WHAT DO I DO ABOUT MY DIABETES MEDICATION?   Do not take oral diabetes medicines (pills) the morning of surgery.  THE MORNING OF SURGERY, do not take insulin aspart (NOVOLOG).   LEVEMIR FLEXTOUCH - Take 40 units the morning of surgery.   The day of surgery, do not take other diabetes injectables, including Byetta (exenatide), Bydureon (exenatide ER), Victoza (liraglutide), or Trulicity (dulaglutide).  If your CBG is greater than 220 mg/dL, you may take  of your sliding scale (correction) dose of insulin.   HOW TO MANAGE YOUR DIABETES BEFORE AND AFTER SURGERY  Why is it important to control my blood sugar before and after surgery? Improving blood sugar levels before and after surgery helps healing and can limit problems. A way of improving blood sugar control is eating a healthy diet by:  Eating less sugar and carbohydrates  Increasing activity/exercise  Talking with your doctor about reaching your blood sugar goals High blood sugars (greater than 180 mg/dL) can raise your risk of infections and slow your recovery, so you will need to focus on controlling your diabetes during the weeks before surgery. Make sure that the doctor who takes care  of your diabetes knows about your planned surgery including the date and location.  How do I manage my blood sugar before surgery? Check your blood sugar at least 4 times a day, starting 2 days before surgery, to make sure that the level is not too high or low.  Check your blood sugar the morning of your surgery when you wake up and every 2 hours until you get to the Short Stay unit.  If your blood sugar is less than 70 mg/dL, you will need to treat for low blood sugar: Do not take insulin. Treat a low blood sugar (less than 70 mg/dL) with  cup of clear juice (cranberry or apple), 4 glucose tablets, OR glucose gel. Recheck blood sugar in 15 minutes after treatment (to make sure it is greater than 70 mg/dL). If your blood sugar is not greater than 70 mg/dL on recheck, call [phone] for further instructions. Report your blood sugar to the short stay nurse when you get to Short Stay.  If you are admitted to the hospital after surgery: Your blood sugar will be checked by the staff and you will probably be given insulin after surgery (instead of oral diabetes medicines) to make sure you have good blood sugar levels. The goal for blood sugar control after surgery is 80-180 mg/dL.    After your COVID test   You are not required to quarantine however you are required to wear a well-fitting mask when you are out and around people not in your household.  If your mask becomes wet or  soiled, replace with a new one.  Wash your hands often with soap and water for 20 seconds or clean your hands with an alcohol-based hand sanitizer that contains at least 60% alcohol.  Do not share personal items.  Notify your provider: if you are in close contact with someone who has COVID  or if you develop a fever of 100.4 or greater, sneezing, cough, sore throat, shortness of breath or body aches.             Do not wear jewelry or makeup Do not wear lotions, powders, perfumes/colognes, or deodorant. Do  not shave 48 hours prior to surgery.  Men may shave face and neck. Do not bring valuables to the hospital. DO Not wear nail polish, gel polish, artificial nails, or any other type of covering on natural nails including finger and toenails. If patients have artificial nails, gel coating, etc. that need to be removed by a nail salon, please have this removed prior to surgery or surgery may need to be canceled/delayed if the surgeon/ anesthesia feels like the patient is unable to be adequately monitored.             [facility] is not responsible for any belongings or valuables.  Do NOT Smoke (Tobacco/Vaping)  24 hours prior to your procedure  If you use a CPAP at night, you may bring your mask for your overnight stay.   Contacts, glasses, hearing aids, dentures or partials may not be worn into surgery, please bring cases for these belongings   For patients admitted to the hospital, discharge time will be determined by your treatment team.   Patients discharged the day of surgery will not be allowed to drive home, and someone needs to stay with them for 24 hours.  NO VISITORS WILL BE ALLOWED IN PRE-OP WHERE PATIENTS ARE PREPPED FOR SURGERY.  ONLY 1 SUPPORT PERSON MAY BE PRESENT IN THE WAITING ROOM WHILE YOU ARE IN SURGERY.  IF YOU ARE TO BE ADMITTED, ONCE YOU ARE IN YOUR ROOM YOU WILL BE ALLOWED TWO (2) VISITORS. 1 (ONE) VISITOR MAY STAY OVERNIGHT BUT MUST ARRIVE TO THE ROOM BY 8pm.  Minor children may have two parents present. Special consideration for safety and communication needs will be reviewed on a case by case basis.  Special instructions:    Oral Hygiene is also important to reduce your risk of infection.  Remember - BRUSH YOUR TEETH THE MORNING OF SURGERY WITH YOUR REGULAR TOOTHPASTE   [facility]- Preparing For Surgery  Before surgery, you can play an important role. Because skin is not sterile, your skin needs to be as free of germs as possible. You can reduce the number of germs  on your skin by washing with CHG (chlorahexidine gluconate) Soap before surgery.  CHG is an antiseptic cleaner which kills germs and bonds with the skin to continue killing germs even after washing.     Please do not use if you have an allergy to CHG or antibacterial soaps. If your skin becomes reddened/irritated stop using the CHG.  Do not shave (including legs and underarms) for at least 48 hours prior to first CHG shower. It is OK to shave your face.  Please follow these instructions carefully.     Shower the NIGHT BEFORE SURGERY and the MORNING OF SURGERY with CHG Soap.   If you chose to wash your hair, wash your hair first as usual with your normal shampoo. After you shampoo, rinse your hair and body  thoroughly to remove the shampoo.  Then [employer] and genitals (private parts) with your normal soap and rinse thoroughly to remove soap.  After that Use CHG Soap as you would any other liquid soap. You can apply CHG directly to the skin and wash gently with a scrungie or a clean washcloth.   Apply the CHG Soap to your body ONLY FROM THE NECK DOWN.  Do not use on open wounds or open sores. Avoid contact with your eyes, ears, mouth and genitals (private parts). Wash Face and genitals (private parts)  with your normal soap.   Wash thoroughly, paying special attention to the area where your surgery will be performed.  Thoroughly rinse your body with warm water from the neck down.  DO NOT shower/wash with your normal soap after using and rinsing off the CHG Soap.  Pat yourself dry with a CLEAN TOWEL.  Wear CLEAN PAJAMAS to bed the night before surgery  Place CLEAN SHEETS on your bed the night before your surgery  DO NOT SLEEP WITH PETS.   Day of Surgery:  Take a shower with CHG soap. Wear Clean/Comfortable clothing the morning of surgery Do not apply any deodorants/lotions.   Remember to brush your teeth WITH YOUR REGULAR TOOTHPASTE.   Please read over the following fact sheets  that you were given.

## 2021-05-16 ENCOUNTER — Encounter (HOSPITAL_COMMUNITY): Payer: Self-pay

## 2021-05-16 ENCOUNTER — Encounter (HOSPITAL_COMMUNITY)
Admission: RE | Admit: 2021-05-16 | Discharge: 2021-05-16 | Disposition: A | Discharge status: Home / Self Care | Payer: HMO | Source: Ambulatory Visit | Attending: Internal Medicine | Admitting: Internal Medicine

## 2021-05-16 ENCOUNTER — Ambulatory Visit (HOSPITAL_COMMUNITY)
Admission: RE | Admit: 2021-05-16 | Discharge: 2021-05-16 | Disposition: A | Discharge status: Home / Self Care | Payer: HMO | Source: Ambulatory Visit | Attending: Internal Medicine | Admitting: Internal Medicine

## 2021-05-16 ENCOUNTER — Other Ambulatory Visit: Payer: Self-pay

## 2021-05-16 VITALS — BP 126/53 | HR 80 | Temp 98.2°F | Resp 19 | Ht 64.5 in | Wt 231.5 lb

## 2021-05-16 DIAGNOSIS — I5033 Acute on chronic diastolic (congestive) heart failure: Secondary | ICD-10-CM | POA: Diagnosis present

## 2021-05-16 DIAGNOSIS — Z90710 Acquired absence of both cervix and uterus: Secondary | ICD-10-CM | POA: Diagnosis not present

## 2021-05-16 DIAGNOSIS — E1143 Type 2 diabetes mellitus with diabetic autonomic (poly)neuropathy: Secondary | ICD-10-CM | POA: Diagnosis present

## 2021-05-16 DIAGNOSIS — Z794 Long term (current) use of insulin: Secondary | ICD-10-CM | POA: Diagnosis not present

## 2021-05-16 DIAGNOSIS — N189 Chronic kidney disease, unspecified: Secondary | ICD-10-CM | POA: Insufficient documentation

## 2021-05-16 DIAGNOSIS — I130 Hypertensive heart and chronic kidney disease with heart failure and stage 1 through stage 4 chronic kidney disease, or unspecified chronic kidney disease: Secondary | ICD-10-CM | POA: Insufficient documentation

## 2021-05-16 DIAGNOSIS — G4733 Obstructive sleep apnea (adult) (pediatric): Secondary | ICD-10-CM | POA: Diagnosis present

## 2021-05-16 DIAGNOSIS — E1121 Type 2 diabetes mellitus with diabetic nephropathy: Secondary | ICD-10-CM | POA: Diagnosis present

## 2021-05-16 DIAGNOSIS — R079 Chest pain, unspecified: Secondary | ICD-10-CM | POA: Diagnosis not present

## 2021-05-16 DIAGNOSIS — E559 Vitamin D deficiency, unspecified: Secondary | ICD-10-CM | POA: Diagnosis not present

## 2021-05-16 DIAGNOSIS — I471 Supraventricular tachycardia: Secondary | ICD-10-CM | POA: Insufficient documentation

## 2021-05-16 DIAGNOSIS — I249 Acute ischemic heart disease, unspecified: Secondary | ICD-10-CM | POA: Diagnosis not present

## 2021-05-16 DIAGNOSIS — Z833 Family history of diabetes mellitus: Secondary | ICD-10-CM | POA: Diagnosis not present

## 2021-05-16 DIAGNOSIS — N184 Chronic kidney disease, stage 4 (severe): Secondary | ICD-10-CM | POA: Diagnosis present

## 2021-05-16 DIAGNOSIS — J449 Chronic obstructive pulmonary disease, unspecified: Secondary | ICD-10-CM | POA: Diagnosis not present

## 2021-05-16 DIAGNOSIS — E1122 Type 2 diabetes mellitus with diabetic chronic kidney disease: Secondary | ICD-10-CM | POA: Diagnosis present

## 2021-05-16 DIAGNOSIS — Z803 Family history of malignant neoplasm of breast: Secondary | ICD-10-CM | POA: Diagnosis not present

## 2021-05-16 DIAGNOSIS — I081 Rheumatic disorders of both mitral and tricuspid valves: Secondary | ICD-10-CM | POA: Diagnosis not present

## 2021-05-16 DIAGNOSIS — J811 Chronic pulmonary edema: Secondary | ICD-10-CM | POA: Diagnosis not present

## 2021-05-16 DIAGNOSIS — Z954 Presence of other heart-valve replacement: Secondary | ICD-10-CM | POA: Diagnosis not present

## 2021-05-16 DIAGNOSIS — I2510 Atherosclerotic heart disease of native coronary artery without angina pectoris: Secondary | ICD-10-CM | POA: Insufficient documentation

## 2021-05-16 DIAGNOSIS — I472 Ventricular tachycardia, unspecified: Secondary | ICD-10-CM | POA: Insufficient documentation

## 2021-05-16 DIAGNOSIS — I493 Ventricular premature depolarization: Secondary | ICD-10-CM | POA: Insufficient documentation

## 2021-05-16 DIAGNOSIS — I340 Nonrheumatic mitral (valve) insufficiency: Secondary | ICD-10-CM

## 2021-05-16 DIAGNOSIS — Z6841 Body Mass Index (BMI) 40.0 and over, adult: Secondary | ICD-10-CM | POA: Diagnosis not present

## 2021-05-16 DIAGNOSIS — I214 Non-ST elevation (NSTEMI) myocardial infarction: Secondary | ICD-10-CM | POA: Diagnosis not present

## 2021-05-16 DIAGNOSIS — I517 Cardiomegaly: Secondary | ICD-10-CM | POA: Diagnosis not present

## 2021-05-16 DIAGNOSIS — Z8249 Family history of ischemic heart disease and other diseases of the circulatory system: Secondary | ICD-10-CM | POA: Diagnosis not present

## 2021-05-16 DIAGNOSIS — Z20822 Contact with and (suspected) exposure to covid-19: Secondary | ICD-10-CM | POA: Insufficient documentation

## 2021-05-16 DIAGNOSIS — Z955 Presence of coronary angioplasty implant and graft: Secondary | ICD-10-CM | POA: Diagnosis not present

## 2021-05-16 DIAGNOSIS — Z006 Encounter for examination for normal comparison and control in clinical research program: Secondary | ICD-10-CM | POA: Diagnosis not present

## 2021-05-16 DIAGNOSIS — Z801 Family history of malignant neoplasm of trachea, bronchus and lung: Secondary | ICD-10-CM | POA: Diagnosis not present

## 2021-05-16 DIAGNOSIS — E1151 Type 2 diabetes mellitus with diabetic peripheral angiopathy without gangrene: Secondary | ICD-10-CM | POA: Diagnosis present

## 2021-05-16 DIAGNOSIS — G931 Anoxic brain damage, not elsewhere classified: Secondary | ICD-10-CM | POA: Diagnosis present

## 2021-05-16 DIAGNOSIS — E039 Hypothyroidism, unspecified: Secondary | ICD-10-CM | POA: Diagnosis present

## 2021-05-16 DIAGNOSIS — Z01818 Encounter for other preprocedural examination: Secondary | ICD-10-CM

## 2021-05-16 DIAGNOSIS — Z7982 Long term (current) use of aspirin: Secondary | ICD-10-CM | POA: Diagnosis not present

## 2021-05-16 DIAGNOSIS — K219 Gastro-esophageal reflux disease without esophagitis: Secondary | ICD-10-CM | POA: Diagnosis not present

## 2021-05-16 HISTORY — DX: Nonrheumatic mitral (valve) insufficiency: I34.0

## 2021-05-16 HISTORY — DX: Polyneuropathy, unspecified: G62.9

## 2021-05-16 HISTORY — DX: Anxiety disorder, unspecified: F41.9

## 2021-05-16 HISTORY — DX: Chronic kidney disease, unspecified: N18.9

## 2021-05-16 HISTORY — DX: Cardiac arrest, cause unspecified: I46.9

## 2021-05-16 HISTORY — DX: Paroxysmal atrial fibrillation: I48.0

## 2021-05-16 HISTORY — DX: Dyspnea, unspecified: R06.00

## 2021-05-16 HISTORY — DX: Anemia, unspecified: D64.9

## 2021-05-16 HISTORY — DX: Pneumonia, unspecified organism: J18.9

## 2021-05-16 HISTORY — DX: Myoneural disorder, unspecified: G70.9

## 2021-05-16 LAB — BLOOD GAS, ARTERIAL
Acid-base deficit: 1.5 mmol/L (ref 0.0–2.0)
Bicarbonate: 22.6 mmol/L (ref 20.0–28.0)
Drawn by: 58793
FIO2: 21
O2 Saturation: 97.8 %
Patient temperature: 37
pCO2 arterial: 37.2 mmHg (ref 32.0–48.0)
pH, Arterial: 7.401 (ref 7.350–7.450)
pO2, Arterial: 94.6 mmHg (ref 83.0–108.0)

## 2021-05-16 LAB — URINALYSIS, ROUTINE W REFLEX MICROSCOPIC
Bilirubin Urine: NEGATIVE
Glucose, UA: NEGATIVE mg/dL
Hgb urine dipstick: NEGATIVE
Ketones, ur: NEGATIVE mg/dL
Nitrite: NEGATIVE
Protein, ur: NEGATIVE mg/dL
Specific Gravity, Urine: 1.009 (ref 1.005–1.030)
WBC, UA: >50 WBC/hpf — ABNORMAL HIGH (ref 0–5)
pH: 5 (ref 5.0–8.0)

## 2021-05-16 LAB — COMPREHENSIVE METABOLIC PANEL
ALT: 15 U/L (ref 0–44)
AST: 19 U/L (ref 15–41)
Albumin: 3.4 g/dL — ABNORMAL LOW (ref 3.5–5.0)
Alkaline Phosphatase: 87 U/L (ref 38–126)
Anion gap: 10 (ref 5–15)
BUN: 57 mg/dL — ABNORMAL HIGH (ref 6–20)
CO2: 22 mmol/L (ref 22–32)
Calcium: 8.8 mg/dL — ABNORMAL LOW (ref 8.9–10.3)
Chloride: 103 mmol/L (ref 98–111)
Creatinine, Ser: 2.24 mg/dL — ABNORMAL HIGH (ref 0.44–1.00)
GFR, Estimated: 25 mL/min — ABNORMAL LOW (ref >60)
Glucose, Bld: 251 mg/dL — ABNORMAL HIGH (ref 70–99)
Potassium: 4.2 mmol/L (ref 3.5–5.1)
Sodium: 135 mmol/L (ref 135–145)
Total Bilirubin: 0.8 mg/dL (ref 0.3–1.2)
Total Protein: 7 g/dL (ref 6.5–8.1)

## 2021-05-16 LAB — PROTIME-INR
INR: 1.1 (ref 0.8–1.2)
Prothrombin Time: 13.7 seconds (ref 11.4–15.2)

## 2021-05-16 LAB — GLUCOSE, CAPILLARY: Glucose-Capillary: 246 mg/dL — ABNORMAL HIGH (ref 70–99)

## 2021-05-16 LAB — CBC
HCT: 25.4 % — ABNORMAL LOW (ref 36.0–46.0)
Hemoglobin: 7.9 g/dL — ABNORMAL LOW (ref 12.0–15.0)
MCH: 26.9 pg (ref 26.0–34.0)
MCHC: 31.1 g/dL (ref 30.0–36.0)
MCV: 86.4 fL (ref 80.0–100.0)
Platelets: 277 10*3/uL (ref 150–400)
RBC: 2.94 MIL/uL — ABNORMAL LOW (ref 3.87–5.11)
RDW: 14.7 % (ref 11.5–15.5)
WBC: 10.8 10*3/uL — ABNORMAL HIGH (ref 4.0–10.5)
nRBC: 0 % (ref 0.0–0.2)

## 2021-05-16 LAB — TYPE AND SCREEN
ABO/RH(D): A NEG
Antibody Screen: NEGATIVE

## 2021-05-16 LAB — SURGICAL PCR SCREEN
MRSA, PCR: NEGATIVE
Staphylococcus aureus: NEGATIVE

## 2021-05-16 LAB — SARS CORONAVIRUS 2 (TAT 6-24 HRS): SARS Coronavirus 2: NEGATIVE

## 2021-05-16 NOTE — Progress Notes (Addendum)
Patient's hemoglobin from today is 7.9. [name] made aware and stated anything above 7 should be ok to proceed. Lorretta Harp, PA with anesthesia also made aware.   Kennedy, RN with [name] made aware patient's abnormal urinalysis from today and stated the office will review.

## 2021-05-16 NOTE — Progress Notes (Signed)
Anesthesia Chart Review:  Case: [id number] Date/Time: 05/18/21 0730   Procedures:      MITRAL VALVE REPAIR     TRANSESOPHAGEAL ECHOCARDIOGRAM (TEE)   Anesthesia type: General   Pre-op diagnosis: Severe Mitral Insufficiency   Location: [facility] CATH LAB 6 / [place] INVASIVE [facility]   Providers: Early Osmond, MD       DISCUSSION: Patient is a 60 year old [name] scheduled for the above procedure.  History includes former smoker, CAD (DES mLAD 04/07/15; DES dRCA & DES pPDA 08/30/16; NSTEMI 08/29/20 cath deferred due to AKI; NSTEMI s/p DES mCX & PCTA OM1 through stent struts 09/29/20), severe MR (04/28/21 echo), chronic diastolic CHF, OSA (uses CPAP, 2L O2), PAF (06/2015 s/p pulmonary vein isolation by cryoablation 10/04/15 by Mahala Menghini, MD), HTN, HLD, DM (insulin dependent), PAD (s/p left great toe & 4th toe amputations), CKD (stage IIIb-IV), anemia, GERD, hypothyroidism, edema, dyspnea, neuropathy, PEA arrest (~ 01/13/21). BMI is consistent with obesity.  - Admitted [facility] 04/24/21-04/29/21 for acute on chronic diastolic CHF. She was was diuresed, but had acute on chronic kidney disease. Right heart cath on 04/27/21 showed mild to moderately elevated filling pressures with very large v-waves in PCWP tracing suggestive of severe diastolic dysfunction vs severe MR. TEE showed new severe MR. She was referred to the structural heart team for consideration of MV intervention.  - Admitted to [facility] 01/12/21 for N/V, constipation/fecal impaction with labs suggestive of DKA. She was started on IVF and insulin gtt. She then developed worsening hypoxia and vascular congestion, hypotension, fever, and leukocytosis and started on antibiotics for possible CAP. She had an episode of resolving RLE weakness with CT planned but she developed PEA arrest with ROSC in ~ 10-15 minutes. She was intubated and had post-arrest oliguria with Creatinine worsening >3 (baseline ~ 1.6-1.8). She was transferred to [name] on 01/14/21 due to high level of care required. PEA arrest felt likely due to worsening respiratory issues. Extubated 01/14/21 but noted to have some difficulty of speech and left sided weakness. MRI/MRA findings consistent with anoxic brain injury and chronic microvascular ischemia. Renal function improved to baseline. She was discharged to rehab 01/20/21-02/06/21.       Preoperative labs showed BUN 57, Creatinine 2.24 which is consistent with her labs since her 04/24/21 admission for acute diastolic CHF with severe MR. Creatinine peaked at 3.13, and had been primarily ~ 1.6-2.0 range since July admission and has been followed by Dr. Audie Clear at [facility]. UA + large leukocytes, negative nitrites which was already called to cardiology. HGB 7.9 (previously HGB 8.2-9.5 since 02/28/21). By notes, baseline HGB ~ 8-9 range and felt related to CKD. [name] was okay with HGB since > 7.0. Received Procrit 04/19/21 per nephrology. T&S done with PAT labs. Will order Prepare 1 unit PRBC to have available, but defer if and when decision to transfuse to cardiology and/or anesthesiologist.   05/16/21 preoperative COVID-19 test negative. Anesthesia team to evaluate on the day of surgery.    VS: BP (!) 126/53   Pulse 80   Temp 36.8 C (Oral)   Resp 19   Ht 5' 4.5" (1.638 m)   Wt 105 kg   SpO2 96%   BMI 39.12 kg/m    PROVIDERS: CoxElnita Maxwell, MD is PCP - [name], MD is HF cardiologist - Francena Hanly, MD is pulmonologist ([facility]). Seen as a new patient on 03/23/21. PFTs and sleep study ordered. She had previously seen Gardiner Rhyme, MD with  [place] Planas, MD is endocrinologist ([facility]) - Biagio Quint, MD is nephrologist ([facility]) - She had post-hospitalization [facility] neurology follow-up on 03/07/21 by Renard Matter, NP for stroke like symptoms in the setting of anoxic brain injury. On ASA and Plavix for secondary stroke prevention. Ongoing follow-up  with cardiology for afib, PCP for HTN/HLD, and endocrinologist for DM control.    LABS: Preoperative labs noted. See DISCUSSION. A1c 6.6% 04/25/21. She uses a [employer].  (all labs ordered are listed, but only abnormal results are displayed)  Labs Reviewed  GLUCOSE, CAPILLARY - Abnormal; Notable for the following components:      Result Value   Glucose-Capillary 246 (*)    All other components within normal limits  CBC - Abnormal; Notable for the following components:   WBC 10.8 (*)    RBC 2.94 (*)    Hemoglobin 7.9 (*)    HCT 25.4 (*)    All other components within normal limits  COMPREHENSIVE METABOLIC PANEL - Abnormal; Notable for the following components:   Glucose, Bld 251 (*)    BUN 57 (*)    Creatinine, Ser 2.24 (*)    Calcium 8.8 (*)    Albumin 3.4 (*)    GFR, Estimated 25 (*)    All other components within normal limits  URINALYSIS, ROUTINE W REFLEX MICROSCOPIC - Abnormal; Notable for the following components:   APPearance CLOUDY (*)    Leukocytes,Ua LARGE (*)    WBC, UA >50 (*)    Bacteria, UA FEW (*)    All other components within normal limits  SURGICAL PCR SCREEN  SARS CORONAVIRUS 2 (TAT 6-24 HRS)  PROTIME-INR  BLOOD GAS, ARTERIAL  TYPE AND SCREEN     IMAGES: CXR 05/16/21:  FINDINGS: The heart is mildly enlarged. There is central pulmonary vascular congestion. There is some minimal strandy opacities in the lung bases. There is no pleural effusion or pneumothorax. No acute fractures are seen. IMPRESSION: 1. Cardiomegaly with mild interstitial edema.  MRI head & MRA head/neck 01/25/21 ([facility]): IMPRESSION:  1. Persistent subtle diffusion abnormalities within the hippocampi  bilaterally. This may be related to recent anoxic injury.  2. Improved signal changes in the right basal ganglia, also likely  related to recent anoxic injury with recovery.  3. No new areas of restricted diffusion to suggest progression.  4. Periventricular white matter  changes are otherwise mildly  advanced for age. This likely reflects the sequela of chronic  microvascular ischemia.  5. No significant proximal stenosis, aneurysm, or branch vessel  occlusion within the Circle of Willis.  6. Possible mild narrowing of the right internal carotid artery at  the bifurcation. Time-of-flight imaging of the cervical vasculature  is otherwise normal.   US Renal  01/14/21 ([facility]): IMPRESSION:  1. Increased renal parenchymal echogenicity consistent with chronic  medical renal disease. No obstructive uropathy.  2. Foley catheter decompresses the urinary bladder.      EKG: 05/08/21: Normal sinus rhythm with sinus arrhythmia Normal ECG No significant change since last tracing Confirmed by Oswaldo Milian [phone]) on 05/08/2021 10:33:17 PM   CV: (05/02/21 scanned echo is a cope of a 01/12/21 TTE from [facility])  TEE 04/28/21: IMPRESSIONS   1. Left ventricular ejection fraction, by estimation, is 60 to 65%. The  left ventricle has normal function. The left ventricle demonstrates  regional wall motion abnormalities (see scoring diagram/findings for  description). There is moderate concentric  left ventricular hypertrophy. There is akinesis  of the left ventricular,  basal inferior wall.   2. Right ventricular systolic function is normal. The right ventricular  size is normal.   3. Left atrial size was moderately dilated. No left atrial/left atrial  appendage thrombus was detected.   4. The MV annulus is dilated. There is mild restrictiction of P2. Severe  central MR (4+) with prominent flow reversal in the pulmonary veins. The  mitral valve is normal in structure. Severe mitral valve regurgitation. No  evidence of mitral stenosis.   5. The aortic valve is normal in structure. Aortic valve regurgitation is  not visualized. No aortic stenosis is present.   6. There is Moderate (Grade III) plaque involving the descending aorta.   7. The inferior  vena cava is normal in size with greater than 50%  respiratory variability, suggesting right atrial pressure of 3 mmHg.   8. There is a small patent foramen ovale.  - Conclusion(s)/Recommendation(s): Normal biventricular function without  evidence of hemodynamically significant valvular heart disease.    [place] 04/27/21: Findings: RA = 8 RV = 59/9 PA = 61/17 (35) PCW = mean 23 (v = 49) -> performed multiple wedges Fick cardiac output/index = PVR = 1.6 WU FA sat = 98% PA sat = Dana%, 70% PaPi = 5.5   Assessment: 1. Mild to moderately elevated filling pressures with very large v-waves in PCWP tracing suggestive of severe diastolic dysfunction (has not had much MR on echo)   Long term event monitor 03/15/21-03/29/21: Patch Wear Time:  14 days and 0 hours (2022-08-31T12:08:59-0400 to 2022-09-14T12:09:09-398)   1. Sinus rhythm - min HR of 51 bpm, max HR of 190 bpm, and avg HR of 70 bpm. Predominant 2. 2. One run of nonsustained Ventricular Tachycardia occurred lasting 5 beats with a max rate of 171 bpm (avg 128 bpm).  3. 66 runs of Supraventricular Tachycardia runs occurred, the run with the fastest interval lasting 5 beats with a max rate of 190 bpm, the longest lasting 16.6 secs with an avg rate of 125 bpm.  4. 4. Frequent PACs (11.2%, [id number]) 5. Rare PVCs   LHC/PCI 09/29/20: 1. 3 vessel CAD with moderate mid-LAD and mid-RCA stenoses, and severe mid-LCx stenosis 2. Successful PCI of the mid-circumflex, reducing severe 90% stenosis to 0% with a 2.75x22 mm Resolute Onyx DES, provisional side branch angioplasty of the OM1 with a 2.5 mm balloon through the stent struts 3. Moderately elevated LVEDP - Recommend: at least 12 motnhs DAPT with ASA and plavix, favor long-term if tolerated   Successful placement of Cardiomems pressure sensor on 03/11/18.   Past Medical History:  Diagnosis Date   Acquired absence of left great toe ([place])    Acquired absence of other left toe(s) ([place])    Anemia     Anxiety    CAD (coronary artery disease)    DES mLAD 04/07/15; DES dRCA & DES pPDA 08/30/16; DES mCX & PTCA OM1 09/29/20   Chronic diastolic (congestive) heart failure (HCC)    Chronic systolic (congestive) heart failure (HCC)    CKD (chronic kidney disease)    Contrast dye induced nephropathy, possible 09/03/2020   Diabetes (HCC)    Diastolic CHF (HCC)    Dyspnea    Gastro-esophageal reflux disease without esophagitis    Hyperlipidemia    Hypertension    Hypertensive heart disease with heart failure (HCC)    Hypoxemia    Idiopathic gout, multiple sites    Localized edema    Low back pain  Mitral regurgitation    Mixed hyperlipidemia    Mixed incontinence    Morbid (severe) obesity due to excess calories (HCC)    Neuromuscular disorder (HCC)    Neuropathy    Non-ST elevation (NSTEMI) myocardial infarction ([place])    08/29/20, 09/29/20   OSA (obstructive sleep apnea) 09/03/2020   Other specified hypothyroidism    PAF (paroxysmal atrial fibrillation) ([place])    s/p pulmonary vein isolation by cryoablation 10/04/15 Dr. Minna Merritts in [facility]   PEA (Pulseless electrical activity) [facility])    ~ 01/13/21 at [facility] during admission DKA, volume overload, possible CAP, hypoxia s/p ACLS with ROSC ~ 10-15   Pneumonia    Primary insomnia    Type 2 diabetes mellitus with diabetic nephropathy ([place])     Past Surgical History:  Procedure Laterality Date   ABDOMINAL HYSTERECTOMY     Partial- Still has both ovaries   CARDIOVASCULAR STRESS TEST  08/13/2014   Nuclear; Normal   CARPAL TUNNEL RELEASE     CATARACT EXTRACTION, BILATERAL     CESAREAN SECTION     CHOLECYSTECTOMY     CORONARY ANGIOPLASTY WITH STENT PLACEMENT     3 blockages/ 1 stent   CORONARY STENT INTERVENTION N/A 09/29/2020   Procedure: CORONARY STENT INTERVENTION;  Surgeon: Sherren Mocha, MD;  Location: [place] CV LAB;  Service: Cardiovascular;  Laterality: N/A;  CFX   EYE SURGERY     LEFT HEART CATH AND CORONARY  ANGIOGRAPHY N/A 09/29/2020   Procedure: LEFT HEART CATH AND CORONARY ANGIOGRAPHY;  Surgeon: Sherren Mocha, MD;  Location: [place] CV LAB;  Service: Cardiovascular;  Laterality: N/A;   RIGHT HEART CATH N/A 04/27/2021   Procedure: RIGHT HEART CATH;  Surgeon: Jolaine Artist, MD;  Location: [place] CV LAB;  Service: Cardiovascular;  Laterality: N/A;   RIGHT/LEFT HEART CATH AND CORONARY ANGIOGRAPHY N/A 01/07/2017   Procedure: Right/Left Heart Cath and Coronary Angiography;  Surgeon: Larey Dresser, MD;  Location: [place] CV LAB;  Service: Cardiovascular;  Laterality: N/A;   ROTATOR CUFF REPAIR     TEE WITHOUT CARDIOVERSION N/A 04/28/2021   Procedure: TRANSESOPHAGEAL ECHOCARDIOGRAM (TEE);  Surgeon: Jolaine Artist, MD;  Location: [facility] ENDOSCOPY;  Service: Cardiovascular;  Laterality: N/A;   TOE AMPUTATION Left    US ECHOCARDIOGRAPHY  05/2017   Normal    MEDICATIONS:  allopurinol (ZYLOPRIM) 100 MG tablet   aspirin EC 81 MG EC tablet   BD PEN NEEDLE NANO 2ND GEN 32G X 4 MM MISC   carvedilol (COREG) 12.5 MG tablet   Cholecalciferol (VITAMIN D) 50 MCG (2000 UT) tablet   clopidogrel (PLAVIX) 75 MG tablet   Cyanocobalamin 3000 MCG CAPS   EPINEPHrine 0.3 mg/0.3 mL IJ SOAJ injection   Evolocumab (REPATHA SURECLICK) 570 MG/ML SOAJ   ezetimibe (ZETIA) 10 MG tablet   famotidine (PEPCID) 20 MG tablet   ferrous sulfate 325 (65 FE) MG tablet   hydrALAZINE (APRESOLINE) 25 MG tablet   HYDROcodone-acetaminophen (NORCO) 7.5-325 MG tablet   insulin aspart (NOVOLOG) 100 UNIT/ML injection   isosorbide mononitrate (IMDUR) 30 MG 24 hr tablet   lactulose (CHRONULAC) 10 GM/15ML solution   latanoprost (XALATAN) 0.005 % ophthalmic solution   LEVEMIR FLEXTOUCH 100 UNIT/ML FlexPen   LORazepam (ATIVAN) 0.5 MG tablet   magnesium oxide (MAG-OX) 400 MG tablet   nitrofurantoin, macrocrystal-monohydrate, (MACROBID) 100 MG capsule   nitroGLYCERIN (NITROSTAT) 0.4 MG SL tablet   Omega-3 Fatty Acids  (FISH OIL PO)   OXYGEN   pantoprazole (  PROTONIX) 40 MG tablet   potassium chloride SA (KLOR-CON) 20 MEQ tablet   Probiotic CAPS   promethazine (PHENERGAN) 25 MG tablet   torsemide (DEMADEX) 20 MG tablet   No current facility-administered medications for this encounter.    Myra Gianotti, PA-C Surgical Short Stay/Anesthesiology [facility] Phone [phone] [facility] Phone [phone] 05/17/2021 9:28 AM

## 2021-05-16 NOTE — Progress Notes (Addendum)
PCP - Dr. [name] Cardiologist - Dr. Jeffie Pollock  PPM/ICD - n/a Device Orders - n/a Rep Notified - n/a  Chest x-ray - 05/16/21- Within two weeks EKG - 05/08/21- Within one month Stress Test - 08/13/14 ECHO - 05/02/21 Cardiac Cath - 05/07/21  Sleep Study - +OSA. Sleep study was 5 years ago in [place] per patient.  Patient states she is due for another sleep study.  CPAP - Wears nightly. Patient states he also uses 2 liters of oxygen nightly.   Fasting Blood Sugar - 85. Daily range 80-150. Wears freestyle libre CBG today- 246. Patient states she had a couple of star burst candy earlier today, grits, coffee with milk, eggs and toast. Patient states her blood sugar has never been this high and she attributes it to the candy she ate earlier. AUlice Brilliant, PA made aware of today's blood sugar. A1 C was 6.6 on 04/25/21.   Aspirin Instructions: Please follow instructions in regards to diabetic medications per pre-admission testing guidelines. Continue taking all other medications without change through the day before surgery. On the morning of surgery you may take all of your morning medications with a few sips of water, except do not take demadex (torsemide).,   ERAS Protcol - No  COVID TEST- 05/16/21. Pending.    Anesthesia review: Yes. Cardiac History. Hemoglobin 7.9 today. Theodosia Quay, RN made aware and she notified [name]. Per [name] anything above 7 is ok to proceed. Lorretta Harp, PA with anesthesia also made aware.   Patient denies shortness of breath, fever, cough and chest pain at PAT appointment   All instructions explained to the patient, with a verbal understanding of the material. Patient agrees to go over the instructions while at home for a better understanding. Patient also instructed to self quarantine after being tested for COVID-19. The opportunity to ask questions was provided.

## 2021-05-17 ENCOUNTER — Encounter (HOSPITAL_COMMUNITY): Payer: Self-pay

## 2021-05-17 NOTE — Anesthesia Preprocedure Evaluation (Addendum)
Anesthesia Evaluation  Patient identified by MRN, date of birth, ID band Patient awake    Reviewed: Allergy & Precautions, NPO status , Patient's Chart, lab work & pertinent test results  Airway Mallampati: II  TM Distance: >3 FB Neck ROM: Full    Dental  (+) Missing   Pulmonary sleep apnea (2L [place] at night), Continuous Positive Airway Pressure Ventilation and Oxygen sleep apnea , COPD, former smoker,    Pulmonary exam normal breath sounds clear to auscultation       Cardiovascular hypertension, Pt. on home beta blockers and Pt. on medications + angina + CAD, + Past MI, + Cardiac Stents, + Peripheral Vascular Disease and +CHF  + Valvular Problems/Murmurs MR  Rhythm:Regular Rate:Normal + Systolic murmurs TEE 99/37/16: IMPRESSIONS  1. Left ventricular ejection fraction, by estimation, is 60 to 65%. The  left ventricle has normal function. The left ventricle demonstrates  regional wall motion abnormalities (see scoring diagram/findings for  description). There is moderate concentric  left ventricular hypertrophy. There is akinesis of the left ventricular,  basal inferior wall.  2. Right ventricular systolic function is normal. The right ventricular  size is normal.  3. Left atrial size was moderately dilated. No left atrial/left atrial  appendage thrombus was detected.  4. The MV annulus is dilated. There is mild restrictiction of P2. Severe  central MR (4+) with prominent flow reversal in the pulmonary veins. The  mitral valve is normal in structure. Severe mitral valve regurgitation. No  evidence of mitral stenosis.  5. The aortic valve is normal in structure. Aortic valve regurgitation is  not visualized. No aortic stenosis is present.  6. There is Moderate (Grade III) plaque involving the descending aorta.  7. The inferior vena cava is normal in size with greater than 50%  respiratory variability, suggesting right atrial  pressure of 3 mmHg.  8. There is a small patent foramen ovale.  - Conclusion(s)/Recommendation(s): Normal biventricular function without  evidence of hemodynamically significant valvular heart disease.      Neuro/Psych PSYCHIATRIC DISORDERS Anxiety Depression  Neuromuscular disease    GI/Hepatic Neg liver ROS, GERD  Medicated and Controlled,  Endo/Other  diabetes, Insulin Dependent  Renal/GU Renal disease     Musculoskeletal Gout   Abdominal (+) + obese,   Peds  Hematology  (+) anemia ,   Anesthesia Other Findings Severe Mitral Insufficiency  Reproductive/Obstetrics                          Anesthesia Physical Anesthesia Plan  ASA: 4  Anesthesia Plan: General   Post-op Pain Management:    Induction: Intravenous  PONV Risk Score and Plan: 3 and Ondansetron, Dexamethasone, Midazolam and Treatment may vary due to age or medical condition  Airway Management Planned: Oral ETT  Additional Equipment: Arterial line and TEE  Intra-op Plan:   Post-operative Plan: Extubation in OR  Informed Consent: I have reviewed the patients History and Physical, chart, labs and discussed the procedure including the risks, benefits and alternatives for the proposed anesthesia with the patient or authorized representative who has indicated his/her understanding and acceptance.     Dental advisory given  Plan Discussed with: CRNA  Anesthesia Plan Comments: (TEE probe placement only   Reviewed PAT note written 05/17/2021 by Myra Gianotti, PA-C. History DM, CAD, chronic diastolic CHF, severe MR, CKD stage 3-4, anemia (baseline HGB 8-9). HGB 7.9 05/16/21. [name] felt labs acceptable for procedure. )  Anesthesia Quick Evaluation  

## 2021-05-18 ENCOUNTER — Encounter (HOSPITAL_COMMUNITY): Payer: Self-pay | Admitting: Internal Medicine

## 2021-05-18 ENCOUNTER — Inpatient Hospital Stay (HOSPITAL_COMMUNITY)
Admission: RE | Admit: 2021-05-18 | Discharge: 2021-05-19 | DRG: 266 | Disposition: A | Discharge status: Home / Self Care | Payer: HMO | Attending: Internal Medicine | Admitting: Internal Medicine

## 2021-05-18 ENCOUNTER — Other Ambulatory Visit: Payer: Self-pay

## 2021-05-18 ENCOUNTER — Inpatient Hospital Stay (HOSPITAL_COMMUNITY): Payer: HMO | Admitting: Physician Assistant

## 2021-05-18 ENCOUNTER — Other Ambulatory Visit: Payer: Self-pay | Admitting: Cardiology

## 2021-05-18 ENCOUNTER — Inpatient Hospital Stay (HOSPITAL_COMMUNITY): Payer: HMO

## 2021-05-18 ENCOUNTER — Encounter (HOSPITAL_COMMUNITY)
Admission: RE | Disposition: A | Discharge status: Home / Self Care | Payer: HMO | Source: Home / Self Care | Attending: Internal Medicine

## 2021-05-18 DIAGNOSIS — Z90710 Acquired absence of both cervix and uterus: Secondary | ICD-10-CM | POA: Diagnosis not present

## 2021-05-18 DIAGNOSIS — Z006 Encounter for examination for normal comparison and control in clinical research program: Secondary | ICD-10-CM

## 2021-05-18 DIAGNOSIS — Z833 Family history of diabetes mellitus: Secondary | ICD-10-CM | POA: Diagnosis not present

## 2021-05-18 DIAGNOSIS — Z886 Allergy status to analgesic agent status: Secondary | ICD-10-CM

## 2021-05-18 DIAGNOSIS — Z794 Long term (current) use of insulin: Secondary | ICD-10-CM | POA: Diagnosis not present

## 2021-05-18 DIAGNOSIS — G931 Anoxic brain damage, not elsewhere classified: Secondary | ICD-10-CM | POA: Diagnosis not present

## 2021-05-18 DIAGNOSIS — I2510 Atherosclerotic heart disease of native coronary artery without angina pectoris: Secondary | ICD-10-CM | POA: Diagnosis present

## 2021-05-18 DIAGNOSIS — Z801 Family history of malignant neoplasm of trachea, bronchus and lung: Secondary | ICD-10-CM

## 2021-05-18 DIAGNOSIS — I130 Hypertensive heart and chronic kidney disease with heart failure and stage 1 through stage 4 chronic kidney disease, or unspecified chronic kidney disease: Secondary | ICD-10-CM | POA: Diagnosis present

## 2021-05-18 DIAGNOSIS — Z87891 Personal history of nicotine dependence: Secondary | ICD-10-CM

## 2021-05-18 DIAGNOSIS — Z8249 Family history of ischemic heart disease and other diseases of the circulatory system: Secondary | ICD-10-CM | POA: Diagnosis not present

## 2021-05-18 DIAGNOSIS — K3184 Gastroparesis: Secondary | ICD-10-CM | POA: Diagnosis present

## 2021-05-18 DIAGNOSIS — E1122 Type 2 diabetes mellitus with diabetic chronic kidney disease: Secondary | ICD-10-CM | POA: Diagnosis present

## 2021-05-18 DIAGNOSIS — E1143 Type 2 diabetes mellitus with diabetic autonomic (poly)neuropathy: Secondary | ICD-10-CM | POA: Diagnosis present

## 2021-05-18 DIAGNOSIS — Z6841 Body Mass Index (BMI) 40.0 and over, adult: Secondary | ICD-10-CM

## 2021-05-18 DIAGNOSIS — I25118 Atherosclerotic heart disease of native coronary artery with other forms of angina pectoris: Secondary | ICD-10-CM | POA: Diagnosis present

## 2021-05-18 DIAGNOSIS — I252 Old myocardial infarction: Secondary | ICD-10-CM

## 2021-05-18 DIAGNOSIS — Z9989 Dependence on other enabling machines and devices: Secondary | ICD-10-CM

## 2021-05-18 DIAGNOSIS — E1121 Type 2 diabetes mellitus with diabetic nephropathy: Secondary | ICD-10-CM | POA: Diagnosis not present

## 2021-05-18 DIAGNOSIS — Z9889 Other specified postprocedural states: Secondary | ICD-10-CM

## 2021-05-18 DIAGNOSIS — I5033 Acute on chronic diastolic (congestive) heart failure: Secondary | ICD-10-CM | POA: Diagnosis present

## 2021-05-18 DIAGNOSIS — M109 Gout, unspecified: Secondary | ICD-10-CM | POA: Diagnosis present

## 2021-05-18 DIAGNOSIS — I340 Nonrheumatic mitral (valve) insufficiency: Secondary | ICD-10-CM | POA: Diagnosis present

## 2021-05-18 DIAGNOSIS — D631 Anemia in chronic kidney disease: Secondary | ICD-10-CM

## 2021-05-18 DIAGNOSIS — I480 Paroxysmal atrial fibrillation: Secondary | ICD-10-CM | POA: Diagnosis present

## 2021-05-18 DIAGNOSIS — Z20822 Contact with and (suspected) exposure to covid-19: Secondary | ICD-10-CM | POA: Diagnosis present

## 2021-05-18 DIAGNOSIS — G4733 Obstructive sleep apnea (adult) (pediatric): Secondary | ICD-10-CM | POA: Diagnosis present

## 2021-05-18 DIAGNOSIS — Z955 Presence of coronary angioplasty implant and graft: Secondary | ICD-10-CM | POA: Diagnosis not present

## 2021-05-18 DIAGNOSIS — Z885 Allergy status to narcotic agent status: Secondary | ICD-10-CM

## 2021-05-18 DIAGNOSIS — K219 Gastro-esophageal reflux disease without esophagitis: Secondary | ICD-10-CM | POA: Diagnosis present

## 2021-05-18 DIAGNOSIS — E1151 Type 2 diabetes mellitus with diabetic peripheral angiopathy without gangrene: Secondary | ICD-10-CM | POA: Diagnosis present

## 2021-05-18 DIAGNOSIS — Z7982 Long term (current) use of aspirin: Secondary | ICD-10-CM

## 2021-05-18 DIAGNOSIS — N184 Chronic kidney disease, stage 4 (severe): Secondary | ICD-10-CM | POA: Diagnosis present

## 2021-05-18 DIAGNOSIS — E1022 Type 1 diabetes mellitus with diabetic chronic kidney disease: Secondary | ICD-10-CM | POA: Diagnosis present

## 2021-05-18 DIAGNOSIS — Z954 Presence of other heart-valve replacement: Secondary | ICD-10-CM | POA: Diagnosis not present

## 2021-05-18 DIAGNOSIS — Z95818 Presence of other cardiac implants and grafts: Secondary | ICD-10-CM

## 2021-05-18 DIAGNOSIS — Z8679 Personal history of other diseases of the circulatory system: Secondary | ICD-10-CM

## 2021-05-18 DIAGNOSIS — E785 Hyperlipidemia, unspecified: Secondary | ICD-10-CM | POA: Diagnosis present

## 2021-05-18 DIAGNOSIS — Z803 Family history of malignant neoplasm of breast: Secondary | ICD-10-CM

## 2021-05-18 DIAGNOSIS — N1832 Chronic kidney disease, stage 3b: Secondary | ICD-10-CM | POA: Diagnosis present

## 2021-05-18 DIAGNOSIS — E039 Hypothyroidism, unspecified: Secondary | ICD-10-CM | POA: Diagnosis present

## 2021-05-18 DIAGNOSIS — E1142 Type 2 diabetes mellitus with diabetic polyneuropathy: Secondary | ICD-10-CM | POA: Diagnosis present

## 2021-05-18 DIAGNOSIS — Z7902 Long term (current) use of antithrombotics/antiplatelets: Secondary | ICD-10-CM

## 2021-05-18 DIAGNOSIS — E782 Mixed hyperlipidemia: Secondary | ICD-10-CM | POA: Diagnosis present

## 2021-05-18 DIAGNOSIS — Z91013 Allergy to seafood: Secondary | ICD-10-CM

## 2021-05-18 HISTORY — PX: TRANSCATHETER MITRAL EDGE TO EDGE REPAIR: CATH118311

## 2021-05-18 HISTORY — PX: TEE WITHOUT CARDIOVERSION: SHX5443

## 2021-05-18 LAB — POCT ACTIVATED CLOTTING TIME
Activated Clotting Time: 237 seconds
Activated Clotting Time: 248 seconds
Activated Clotting Time: 271 seconds
Activated Clotting Time: 283 seconds
Activated Clotting Time: 271 seconds

## 2021-05-18 LAB — GLUCOSE, CAPILLARY
Glucose-Capillary: 117 mg/dL — ABNORMAL HIGH (ref 70–99)
Glucose-Capillary: 96 mg/dL (ref 70–99)
Glucose-Capillary: 172 mg/dL — ABNORMAL HIGH (ref 70–99)
Glucose-Capillary: 302 mg/dL — ABNORMAL HIGH (ref 70–99)
Glucose-Capillary: 240 mg/dL — ABNORMAL HIGH (ref 70–99)

## 2021-05-18 LAB — ECHO TEE
MV M vel: 5.28 m/s
MV Peak grad: 111.3 mmHg
MV Vena cont: 0.52 cm
Radius: 0.63 cm

## 2021-05-18 SURGERY — MITRAL VALVE REPAIR
Anesthesia: General

## 2021-05-18 MED ORDER — CHLORHEXIDINE GLUCONATE 4 % EX LIQD: 60.0000 mL | Freq: Once | CUTANEOUS | Status: DC

## 2021-05-18 MED ORDER — HEPARIN SODIUM (PORCINE) 1000 UNIT/ML IJ SOLN
INTRAMUSCULAR | Status: DC | PRN
  Administered 2021-05-18: 2000 [IU] via INTRAVENOUS
  Administered 2021-05-18: 4000 [IU] via INTRAVENOUS
  Administered 2021-05-18: 5000 [IU] via INTRAVENOUS
  Administered 2021-05-18: 10000 [IU] via INTRAVENOUS
  Administered 2021-05-18: 1000 [IU] via INTRAVENOUS

## 2021-05-18 MED ORDER — FUROSEMIDE 10 MG/ML IJ SOLN
INTRAMUSCULAR | Status: AC
  Filled 2021-05-18: qty 8

## 2021-05-18 MED ORDER — DEXAMETHASONE SODIUM PHOSPHATE 10 MG/ML IJ SOLN
INTRAMUSCULAR | Status: DC | PRN
  Administered 2021-05-18: 5 mg via INTRAVENOUS

## 2021-05-18 MED ORDER — LIDOCAINE-EPINEPHRINE 1 %-1:100000 IJ SOLN
20.0000 mL | Freq: Once | INTRAMUSCULAR | Status: AC
  Administered 2021-05-18: 20 mL via INTRADERMAL
  Filled 2021-05-18: qty 20

## 2021-05-18 MED ORDER — SODIUM CHLORIDE 0.9 % IV SOLN: INTRAVENOUS | Status: DC

## 2021-05-18 MED ORDER — ACETAMINOPHEN 325 MG PO TABS: 650.0000 mg | ORAL_TABLET | ORAL | Status: DC | PRN

## 2021-05-18 MED ORDER — ONDANSETRON HCL 4 MG/2ML IJ SOLN: 4.0000 mg | Freq: Four times a day (QID) | INTRAMUSCULAR | Status: DC | PRN

## 2021-05-18 MED ORDER — HEPARIN (PORCINE) IN NACL 2000-0.9 UNIT/L-% IV SOLN
INTRAVENOUS | Status: DC | PRN
  Administered 2021-05-18 (×3): 1000 mL

## 2021-05-18 MED ORDER — PHENYLEPHRINE HCL-NACL 20-0.9 MG/250ML-% IV SOLN
INTRAVENOUS | Status: DC | PRN
  Administered 2021-05-18: 40 ug/min via INTRAVENOUS

## 2021-05-18 MED ORDER — HYDRALAZINE HCL 20 MG/ML IJ SOLN: 5.0000 mg | INTRAMUSCULAR | Status: DC | PRN

## 2021-05-18 MED ORDER — HYDRALAZINE HCL 25 MG PO TABS
25.0000 mg | ORAL_TABLET | Freq: Three times a day (TID) | ORAL | Status: DC
  Administered 2021-05-18 – 2021-05-19 (×4): 25 mg via ORAL
  Filled 2021-05-18 (×4): qty 1

## 2021-05-18 MED ORDER — MENTHOL 3 MG MT LOZG
1.0000 | LOZENGE | OROMUCOSAL | Status: DC | PRN
  Administered 2021-05-18: 3 mg via ORAL
  Filled 2021-05-18: qty 9

## 2021-05-18 MED ORDER — SODIUM CHLORIDE 0.9 % IV SOLN: 250.0000 mL | INTRAVENOUS | Status: DC | PRN

## 2021-05-18 MED ORDER — LACTATED RINGERS IV SOLN: INTRAVENOUS | Status: DC | PRN

## 2021-05-18 MED ORDER — CARVEDILOL 12.5 MG PO TABS
12.5000 mg | ORAL_TABLET | Freq: Two times a day (BID) | ORAL | Status: DC
  Administered 2021-05-18 – 2021-05-19 (×2): 12.5 mg via ORAL
  Filled 2021-05-18 (×2): qty 1

## 2021-05-18 MED ORDER — FENTANYL CITRATE (PF) 100 MCG/2ML IJ SOLN
INTRAMUSCULAR | Status: AC
  Filled 2021-05-18: qty 2

## 2021-05-18 MED ORDER — CEFAZOLIN SODIUM-DEXTROSE 2-4 GM/100ML-% IV SOLN
2.0000 g | INTRAVENOUS | Status: AC
  Administered 2021-05-18: 2 g via INTRAVENOUS
  Filled 2021-05-18: qty 100

## 2021-05-18 MED ORDER — HYDROCODONE-ACETAMINOPHEN 7.5-325 MG PO TABS
1.0000 | ORAL_TABLET | Freq: Three times a day (TID) | ORAL | Status: DC | PRN
  Administered 2021-05-18 – 2021-05-19 (×3): 1 via ORAL
  Filled 2021-05-18 (×3): qty 1

## 2021-05-18 MED ORDER — FENTANYL CITRATE (PF) 250 MCG/5ML IJ SOLN
INTRAMUSCULAR | Status: DC | PRN
  Administered 2021-05-18: 50 ug via INTRAVENOUS
  Administered 2021-05-18 (×2): 25 ug via INTRAVENOUS

## 2021-05-18 MED ORDER — FUROSEMIDE 10 MG/ML IJ SOLN
INTRAMUSCULAR | Status: DC | PRN
  Administered 2021-05-18: 60 mg via INTRAMUSCULAR

## 2021-05-18 MED ORDER — FENTANYL CITRATE (PF) 100 MCG/2ML IJ SOLN
INTRAMUSCULAR | Status: DC | PRN
Start: 2021-05-18 — End: 2021-05-18

## 2021-05-18 MED ORDER — MIDAZOLAM HCL 2 MG/2ML IJ SOLN
INTRAMUSCULAR | Status: DC | PRN
  Administered 2021-05-18: 2 mg via INTRAVENOUS

## 2021-05-18 MED ORDER — LIDOCAINE-EPINEPHRINE (PF) 1 %-1:200000 IJ SOLN
20.0000 mL | Freq: Once | INTRAMUSCULAR | Status: DC
  Filled 2021-05-18: qty 20

## 2021-05-18 MED ORDER — LATANOPROST 0.005 % OP SOLN
1.0000 [drp] | Freq: Every day | OPHTHALMIC | Status: DC
  Administered 2021-05-18: 1 [drp] via OPHTHALMIC
  Filled 2021-05-18: qty 2.5

## 2021-05-18 MED ORDER — CHLORHEXIDINE GLUCONATE 0.12 % MT SOLN
15.0000 mL | Freq: Once | OROMUCOSAL | Status: AC
  Administered 2021-05-18: 15 mL via OROMUCOSAL
  Filled 2021-05-18: qty 15

## 2021-05-18 MED ORDER — HEPARIN (PORCINE) IN NACL 1000-0.9 UT/500ML-% IV SOLN
INTRAVENOUS | Status: DC | PRN
  Administered 2021-05-18: 500 mL

## 2021-05-18 MED ORDER — MIDAZOLAM HCL 2 MG/2ML IJ SOLN
INTRAMUSCULAR | Status: AC
  Filled 2021-05-18: qty 2

## 2021-05-18 MED ORDER — ONDANSETRON HCL 4 MG/2ML IJ SOLN
INTRAMUSCULAR | Status: DC | PRN
  Administered 2021-05-18: 4 mg via INTRAVENOUS

## 2021-05-18 MED ORDER — SODIUM CHLORIDE 0.9% FLUSH
3.0000 mL | Freq: Two times a day (BID) | INTRAVENOUS | Status: DC
  Administered 2021-05-18 – 2021-05-19 (×3): 3 mL via INTRAVENOUS

## 2021-05-18 MED ORDER — CLOPIDOGREL BISULFATE 75 MG PO TABS
75.0000 mg | ORAL_TABLET | Freq: Every day | ORAL | Status: DC
  Administered 2021-05-18: 75 mg via ORAL
  Filled 2021-05-18: qty 1

## 2021-05-18 MED ORDER — FAMOTIDINE 20 MG PO TABS
20.0000 mg | ORAL_TABLET | Freq: Every day | ORAL | Status: DC
  Administered 2021-05-19: 20 mg via ORAL
  Filled 2021-05-18: qty 1

## 2021-05-18 MED ORDER — ALLOPURINOL 100 MG PO TABS
100.0000 mg | ORAL_TABLET | Freq: Every day | ORAL | Status: DC
  Administered 2021-05-19: 100 mg via ORAL
  Filled 2021-05-18: qty 1

## 2021-05-18 MED ORDER — EPHEDRINE SULFATE-NACL 50-0.9 MG/10ML-% IV SOSY
PREFILLED_SYRINGE | INTRAVENOUS | Status: DC | PRN
  Administered 2021-05-18: 10 mg via INTRAVENOUS
  Administered 2021-05-18 (×5): 5 mg via INTRAVENOUS

## 2021-05-18 MED ORDER — INSULIN ASPART 100 UNIT/ML IJ SOLN
0.0000 [IU] | INTRAMUSCULAR | Status: DC
  Administered 2021-05-18: 16 [IU] via SUBCUTANEOUS
  Administered 2021-05-18 – 2021-05-19 (×3): 4 [IU] via SUBCUTANEOUS
  Administered 2021-05-19: 8 [IU] via SUBCUTANEOUS
  Administered 2021-05-19 (×2): 2 [IU] via SUBCUTANEOUS

## 2021-05-18 MED ORDER — LIDOCAINE 2% (20 MG/ML) 5 ML SYRINGE
INTRAMUSCULAR | Status: DC | PRN
  Administered 2021-05-18: 60 mg via INTRAVENOUS

## 2021-05-18 MED ORDER — HEPARIN (PORCINE) IN NACL 1000-0.9 UT/500ML-% IV SOLN
INTRAVENOUS | Status: AC
  Filled 2021-05-18: qty 500

## 2021-05-18 MED ORDER — ASPIRIN EC 81 MG PO TBEC
81.0000 mg | DELAYED_RELEASE_TABLET | Freq: Every day | ORAL | Status: DC
  Administered 2021-05-19: 81 mg via ORAL
  Filled 2021-05-18: qty 1

## 2021-05-18 MED ORDER — SODIUM CHLORIDE 0.9% FLUSH: 3.0000 mL | INTRAVENOUS | Status: DC | PRN

## 2021-05-18 MED ORDER — PROPOFOL 10 MG/ML IV BOLUS
INTRAVENOUS | Status: DC | PRN
  Administered 2021-05-18 (×3): 50 mg via INTRAVENOUS

## 2021-05-18 MED ORDER — SUGAMMADEX SODIUM 200 MG/2ML IV SOLN
INTRAVENOUS | Status: DC | PRN
  Administered 2021-05-18: 400 mg via INTRAVENOUS

## 2021-05-18 MED ORDER — AMIODARONE HCL IN DEXTROSE 360-4.14 MG/200ML-% IV SOLN
INTRAVENOUS | Status: DC | PRN
  Administered 2021-05-18: 150 mg/h via INTRAVENOUS

## 2021-05-18 MED ORDER — EZETIMIBE 10 MG PO TABS
10.0000 mg | ORAL_TABLET | Freq: Every day | ORAL | Status: DC
  Administered 2021-05-18 – 2021-05-19 (×2): 10 mg via ORAL
  Filled 2021-05-18 (×2): qty 1

## 2021-05-18 MED ORDER — CHLORHEXIDINE GLUCONATE 4 % EX LIQD: 30.0000 mL | CUTANEOUS | Status: DC

## 2021-05-18 MED ORDER — LABETALOL HCL 5 MG/ML IV SOLN: 10.0000 mg | INTRAVENOUS | Status: DC | PRN

## 2021-05-18 MED ORDER — PROTAMINE SULFATE 10 MG/ML IV SOLN
INTRAVENOUS | Status: DC | PRN
  Administered 2021-05-18: 30 mg via INTRAVENOUS

## 2021-05-18 MED ORDER — ROCURONIUM BROMIDE 10 MG/ML (PF) SYRINGE
PREFILLED_SYRINGE | INTRAVENOUS | Status: DC | PRN
  Administered 2021-05-18: 20 mg via INTRAVENOUS
  Administered 2021-05-18: 50 mg via INTRAVENOUS
  Administered 2021-05-18: 10 mg via INTRAVENOUS
  Administered 2021-05-18 (×3): 20 mg via INTRAVENOUS

## 2021-05-18 MED ORDER — HEPARIN (PORCINE) IN NACL 2000-0.9 UNIT/L-% IV SOLN
INTRAVENOUS | Status: AC
  Filled 2021-05-18: qty 3000

## 2021-05-18 MED ORDER — LIDOCAINE HCL (PF) 1 % IJ SOLN: INTRAMUSCULAR | Status: DC | PRN

## 2021-05-18 MED ORDER — ISOSORBIDE MONONITRATE ER 30 MG PO TB24
30.0000 mg | ORAL_TABLET | Freq: Every day | ORAL | Status: DC
  Administered 2021-05-19: 30 mg via ORAL
  Filled 2021-05-18: qty 1

## 2021-05-18 SURGICAL SUPPLY — 19 items
CATH MITRA STEERABLE GUIDE (CATHETERS) ×2 IMPLANT
CLIP MITRA G4 DELIVERY SYS NTW (Clip) ×2 IMPLANT
CLIP MITRA G4 DELIVERY SYS XTW (Clip) ×2 IMPLANT
CLOSURE PERCLOSE PROSTYLE (VASCULAR PRODUCTS) ×4 IMPLANT
ELECT DEFIB PAD ADLT CADENCE (PAD) ×2 IMPLANT
KIT DILATOR VASC 18G NDL (KITS) ×2 IMPLANT
KIT HEART LEFT (KITS) ×4 IMPLANT
KIT SHEA VERSACROSS LAAC CONNE (KITS) ×2 IMPLANT
NEEDLE BAYLIS TRANSSEPTAL 71CM (NEEDLE) ×4 IMPLANT
PACK CARDIAC CATHETERIZATION (CUSTOM PROCEDURE TRAY) ×2 IMPLANT
SHEATH AGILIS NXT 8.5F 71CM (SHEATH) ×2 IMPLANT
SHEATH PINNACLE 8F 10CM (SHEATH) ×2 IMPLANT
SHEATH SWARTZ TS SL2 63CM 8.5F (SHEATH) ×2 IMPLANT
STOPCOCK MORSE 400PSI 3WAY (MISCELLANEOUS) ×12 IMPLANT
SYSTEM MITRACLIP G4 (SYSTAGENIX WOUND MANAGEMENT) ×2 IMPLANT
WIRE EMERALD 3MM-J .035X150CM (WIRE) ×2 IMPLANT
WIRE EMERALD ST .035X260CM (WIRE) ×2 IMPLANT
WIRE MICRO SET SILHO 5FR 7 (SHEATH) ×2 IMPLANT
WIRE MICROINTRODUCER 60CM (WIRE) ×2 IMPLANT

## 2021-05-18 NOTE — Progress Notes (Signed)
Left radial arterial line pulled. Catheter intact Pressure held for 15 minutes. Level 0 Gauze and Tegaderm placed on site

## 2021-05-18 NOTE — Transfer of Care (Signed)
Immediate Anesthesia Transfer of Care Note  Patient: Dana [name]  Procedure(s) Performed: MITRAL VALVE REPAIR TRANSESOPHAGEAL ECHOCARDIOGRAM (TEE)  Patient Location: Endoscopy Unit  Anesthesia Type:General  Level of Consciousness: awake, alert  and oriented  Airway & Oxygen Therapy: Patient Spontanous Breathing and Patient connected to nasal cannula oxygen  Post-op Assessment: Report given to RN and Post -op Vital signs reviewed and stable  Post vital signs: Reviewed and stable  Last Vitals:  Vitals Value Taken Time  BP 105/41 05/18/21 1159  Temp 36.5 C 05/18/21 1159  Pulse 65 05/18/21 1200  Resp 14 05/18/21 1200  SpO2 92 % 05/18/21 1200  Vitals shown include unvalidated device data.  Last Pain:  Vitals:   05/18/21 1159  TempSrc: Temporal  PainSc: 0-No pain         Complications: No notable events documented.

## 2021-05-18 NOTE — Anesthesia Procedure Notes (Signed)
Procedure Name: Intubation Date/Time: 05/18/2021 7:47 AM Performed by: Erick Colace, CRNA Pre-anesthesia Checklist: Patient identified, Emergency Drugs available, Suction available and Patient being monitored Patient Re-evaluated:Patient Re-evaluated prior to induction Oxygen Delivery Method: Circle system utilized Preoxygenation: Pre-oxygenation with 100% oxygen Induction Type: IV induction and Cricoid Pressure applied Ventilation: Mask ventilation without difficulty and Oral airway inserted - appropriate to patient size Laryngoscope Size: Mac and 4 Grade View: Grade II Tube type: Oral Tube size: 7.0 mm Number of attempts: 1 Airway Equipment and Method: Stylet and Oral airway Placement Confirmation: ETT inserted through vocal cords under direct vision, positive ETCO2 and breath sounds checked- equal and bilateral Secured at: 21 cm Tube secured with: Tape Dental Injury: Teeth and Oropharynx as per pre-operative assessment

## 2021-05-18 NOTE — Anesthesia Procedure Notes (Addendum)
Arterial Line Insertion Start/End11/09/2020 7:15 AM, 05/18/2021 7:20 AM Performed by: Murvin Natal, MD, anesthesiologist  Patient location: Pre-op. Preanesthetic checklist: patient identified, IV checked, site marked, risks and benefits discussed, surgical consent, monitors and equipment checked, pre-op evaluation, timeout performed and anesthesia consent Lidocaine 1% used for infiltration Left, radial was placed Catheter size: 20 G Hand hygiene performed , maximum sterile barriers used  and Seldinger technique used  Attempts: 1 Procedure performed using ultrasound guided technique. Ultrasound Notes:anatomy identified, needle tip was noted to be adjacent to the nerve/plexus identified and no ultrasound evidence of intravascular and/or intraneural injection Following insertion, dressing applied and Biopatch. Post procedure assessment: normal and unchanged  Patient tolerated the procedure well with no immediate complications.

## 2021-05-18 NOTE — Progress Notes (Signed)
CBG this AM 117.

## 2021-05-18 NOTE — Anesthesia Postprocedure Evaluation (Signed)
Anesthesia Post Note  Patient: VINISHA FAXON  Procedure(s) Performed: MITRAL VALVE REPAIR TRANSESOPHAGEAL ECHOCARDIOGRAM (TEE)     Patient location during evaluation: Cath Lab Anesthesia Type: General Level of consciousness: awake Pain management: pain level controlled Vital Signs Assessment: post-procedure vital signs reviewed and stable Respiratory status: spontaneous breathing, nonlabored ventilation, respiratory function stable and patient connected to nasal cannula oxygen Cardiovascular status: blood pressure returned to baseline and stable Postop Assessment: no apparent nausea or vomiting Anesthetic complications: no   There were no known notable events for this encounter.  Last Vitals:  Vitals:   05/18/21 1642 05/18/21 1958  BP: (!) 133/58 (!) 125/52  Pulse: 81 87  Resp: 16 16  Temp: 36.5 C 36.6 C  SpO2:  95%    Last Pain:  Vitals:   05/18/21 1958  TempSrc: Oral  PainSc:                  Lesieli Bresee P Derel Mcglasson

## 2021-05-18 NOTE — Interval H&P Note (Signed)
History and Physical Interval Note:  05/18/2021 7:05 AM  Dana [name]  has presented today for surgery, with the diagnosis of Severe Mitral Insufficiency.  The various methods of treatment have been discussed with the patient and family. After consideration of risks, benefits and other options for treatment, the patient has consented to  Procedure(s): MITRAL VALVE REPAIR (N/A) TRANSESOPHAGEAL ECHOCARDIOGRAM (TEE) (N/A) as a surgical intervention.  The patient's history has been reviewed, patient examined, no change in status, stable for surgery.  I have reviewed the patient's chart and labs.  Questions were answered to the patient's satisfaction.     Early Osmond

## 2021-05-19 ENCOUNTER — Inpatient Hospital Stay (HOSPITAL_COMMUNITY): Payer: HMO

## 2021-05-19 DIAGNOSIS — I340 Nonrheumatic mitral (valve) insufficiency: Principal | ICD-10-CM

## 2021-05-19 DIAGNOSIS — Z20822 Contact with and (suspected) exposure to covid-19: Secondary | ICD-10-CM | POA: Diagnosis not present

## 2021-05-19 DIAGNOSIS — I5033 Acute on chronic diastolic (congestive) heart failure: Secondary | ICD-10-CM | POA: Diagnosis not present

## 2021-05-19 DIAGNOSIS — Z954 Presence of other heart-valve replacement: Secondary | ICD-10-CM

## 2021-05-19 DIAGNOSIS — Z95818 Presence of other cardiac implants and grafts: Secondary | ICD-10-CM

## 2021-05-19 DIAGNOSIS — Z006 Encounter for examination for normal comparison and control in clinical research program: Secondary | ICD-10-CM | POA: Diagnosis not present

## 2021-05-19 DIAGNOSIS — Z9889 Other specified postprocedural states: Secondary | ICD-10-CM

## 2021-05-19 LAB — ECHOCARDIOGRAM COMPLETE
Area-P 1/2: 2.53 cm2
Height: 64.5 in
S' Lateral: 3.51 cm
Weight: 3704.01 oz

## 2021-05-19 LAB — GLUCOSE, CAPILLARY
Glucose-Capillary: 149 mg/dL — ABNORMAL HIGH (ref 70–99)
Glucose-Capillary: 146 mg/dL — ABNORMAL HIGH (ref 70–99)
Glucose-Capillary: 170 mg/dL — ABNORMAL HIGH (ref 70–99)
Glucose-Capillary: 178 mg/dL — ABNORMAL HIGH (ref 70–99)

## 2021-05-19 LAB — BASIC METABOLIC PANEL
Anion gap: 9 (ref 5–15)
BUN: 68 mg/dL — ABNORMAL HIGH (ref 6–20)
CO2: 23 mmol/L (ref 22–32)
Calcium: 8.4 mg/dL — ABNORMAL LOW (ref 8.9–10.3)
Chloride: 101 mmol/L (ref 98–111)
Creatinine, Ser: 2.46 mg/dL — ABNORMAL HIGH (ref 0.44–1.00)
GFR, Estimated: 22 mL/min — ABNORMAL LOW (ref >60)
Glucose, Bld: 215 mg/dL — ABNORMAL HIGH (ref 70–99)
Potassium: 4.6 mmol/L (ref 3.5–5.1)
Sodium: 133 mmol/L — ABNORMAL LOW (ref 135–145)

## 2021-05-19 LAB — CBC
HCT: 23.9 % — ABNORMAL LOW (ref 36.0–46.0)
Hemoglobin: 7.5 g/dL — ABNORMAL LOW (ref 12.0–15.0)
MCH: 26.9 pg (ref 26.0–34.0)
MCHC: 31.4 g/dL (ref 30.0–36.0)
MCV: 85.7 fL (ref 80.0–100.0)
Platelets: 246 10*3/uL (ref 150–400)
RBC: 2.79 MIL/uL — ABNORMAL LOW (ref 3.87–5.11)
RDW: 14.8 % (ref 11.5–15.5)
WBC: 14 10*3/uL — ABNORMAL HIGH (ref 4.0–10.5)
nRBC: 0 % (ref 0.0–0.2)

## 2021-05-19 LAB — URINE CULTURE

## 2021-05-19 MED ORDER — FUROSEMIDE 10 MG/ML IJ SOLN
40.0000 mg | Freq: Once | INTRAMUSCULAR | Status: AC
  Administered 2021-05-19: 40 mg via INTRAVENOUS
  Filled 2021-05-19: qty 4

## 2021-05-19 MED ORDER — POTASSIUM CHLORIDE CRYS ER 20 MEQ PO TBCR
40.0000 meq | EXTENDED_RELEASE_TABLET | Freq: Once | ORAL | Status: AC
  Administered 2021-05-19: 40 meq via ORAL
  Filled 2021-05-19: qty 2

## 2021-05-19 NOTE — Discharge Instructions (Signed)
Home Care Following Your MitraClip Procedure      If you have any questions or concerns you can call the structural heart office at [phone] during normal business hours 8am-4pm. If you have an urgent need after hours or on the weekend, please call [phone] to talk to the on call provider for general cardiology. If you have an emergency that requires immediate attention, please call 911.   Groin Site Care Refer to this sheet in the next few weeks. These instructions provide you with information on caring for yourself after your procedure. Your caregiver may also give you more specific instructions. Your treatment has been planned according to current medical practices, but problems sometimes occur. Call your caregiver if you have any problems or questions after your procedure. HOME CARE INSTRUCTIONS You may shower 24 hours after the procedure. Remove the bandage (dressing) and gently wash the site with plain soap and water. Gently pat the site dry.  Do not apply powder or lotion to the site.  Do not sit in a bathtub, swimming pool, or whirlpool for 5 to 7 days.  No bending, squatting, or lifting anything over 10 pounds (4.5 kg) as directed by your caregiver.  Inspect the site at least twice daily.  Do not drive home if you are discharged the same day of the procedure. Have someone else drive you.  You may drive 72 hours after the procedure unless otherwise instructed by your caregiver.  What to expect: Any bruising will usually fade within 1 to 2 weeks.  Blood that collects in the tissue (hematoma) may be painful to the touch. It should usually decrease in size and tenderness within 1 to 2 weeks.  SEEK IMMEDIATE MEDICAL CARE IF: You have unusual pain at the groin site or down the affected leg.  You have redness, warmth, swelling, or pain at the groin site.  You have drainage (other than a small amount of blood on the dressing).  You have chills.  You have a fever or persistent  symptoms for more than 72 hours.  You have a fever and your symptoms suddenly get worse.  Your leg becomes pale, cool, tingly, or numb.  You have bleeding from the site. Hold pressure on the site until it subsides.    After MitraClip Checklist  Check  Test Description   Follow up appointment in 1-2 weeks  Most of our patients will see our structural heart physician assistant, Nell Range, or your primary cardiologist within 1-2 weeks. Your incision site will be checked and you will be cleared to resume all normal activities if you are doing well.     1 month echo and follow up  You will have an echo to check on your heart valve clip and be seen back in the office by Nell Range PA-C or Kathyrn Drown NP   Follow up with your primary cardiologist You will need to be seen by your primary cardiologist in the following 3-6 months after your 1 month appointment in the valve clinic. Often times your Plavix or Aspirin will be discontinued during this time, but this is decided on a case by case basis.    1 year echo and follow up You will have another echo to check on your heart valve after one year and be seen back in the office by Nell Range or Kathyrn Drown. This your last structural heart visit.   Bacterial endocarditis prophylaxis  You will have to take antibiotics for the rest of your life  before all dental procedures (even dental cleanings) to protect your heart valve from potential infection. Antibiotics are also required before some surgeries. Please check with your cardiologist before scheduling any surgeries. Also, please make sure to tell us if you have a penicillin allergy as you will require an alternative antibiotic.    ______________  Your Implant Identification Card Following your procedure, you will receive an Implant Identification Card, which your doctor will fill out and which you must carry with you at all times. Show your Implant Identification Card if you report to an  emergency room. This card identifies you as a patient who has had a MitraClip device implanted. If you require a magnetic resonance imaging (MRI) scan, tell your doctor or MRI technician that you have a MitraClip device implanted. Test results indicate that patients with the MitraClip device can safely undergo MRI scans under certain conditions described on the card.

## 2021-05-19 NOTE — Progress Notes (Signed)
CARDIAC REHAB PHASE I   PRE:  Rate/Rhythm: 80 SR    BP: sitting 138/61    SaO2: 100 RA  MODE:  Ambulation: 32 ft   POST:  Rate/Rhythm: 90 SR    BP: sitting 149/63     SaO2: 100 RA  Pt slow and weak. Used RW. Increased pace heading back to room. VSS. Sts she thinks her SOB is a little better. Encouraged increasing activity as tolerated at home. She requests referral be sent to [facility] and they will see how she does. Discussed restrictions. Daughter present and supportive. [place], ACSM 05/19/2021 10:42 AM

## 2021-05-19 NOTE — TOC Transition Note (Addendum)
Transition of Care [facility]) - CM/SW Discharge Note   Patient Details  Name: Dana [name] MRN: [id number] Date of Birth: 08-Aug-1960  [facility] [address] [address] CM/SW Contact:  Zenon Mayo, RN Phone Number: 05/19/2021, 1:25 PM   Clinical Narrative:    Patient is from home with spouse, s/p mitral clip, she is for dc today if echo is ok.  Spouse will transport her home .  Patient would like to have a rolling walker, she is ok with [employer] supplying this. NCM made referral to [facility] with [employer], this will be brought to her room prior to dc.    Final next level of care: Home/Self Care Barriers to Discharge: No Barriers Identified   Patient Goals and CMS Choice Patient states their goals for this hospitalization and ongoing recovery are:: return home   Choice offered to / list presented to : NA  Discharge Placement                       Discharge Plan and Services   Discharge Planning Services: CM Consult Post Acute Care Choice: NA                    HH Arranged: NA          Social Determinants of Health (SDOH) Interventions     Readmission Risk Interventions Readmission Risk Prevention Plan 05/19/2021 10/06/2020 10/04/2020  Transportation Screening Complete Complete Complete  PCP or Specialist Appt within 3-5 Days - Complete -  HRI or [place] Work Consult for [place] Planning/Counseling - Complete -  Palliative Care Screening - Not Applicable Not Applicable  Medication Review Press photographer) Complete Complete Complete  PCP or Specialist appointment within 3-5 days of discharge Complete - -  [place] or Home Care Consult Complete - -  SW Recovery Care/Counseling Consult Complete - -  Palliative Care Screening Not Applicable - -  [place] Not Applicable - -  Some recent data might be hidden

## 2021-05-19 NOTE — Progress Notes (Signed)
Mobility Specialist Progress Note    05/19/21 1435  Mobility  Activity Ambulated in hall  Level of Assistance Standby assist, set-up cues, supervision of patient - no hands on  Assistive Device Front wheel walker  Distance Ambulated (ft) 65 ft  Mobility Ambulated with assistance in hallway  Mobility Response Tolerated well  Mobility performed by Mobility specialist  $Mobility charge 1 Mobility   Pt received in bed and agreeable. No complaints on walk. Returned to room with family present.   Pt interested in taking a RW home, RN notified.   Hildred Alamin Mobility Specialist  Mobility Specialist Phone: [phone]

## 2021-05-19 NOTE — TOC Initial Note (Signed)
Transition of Care [facility]) - Initial/Assessment Note    Patient Details  Name: Dana [name] MRN: [id number] Date of Birth: [birth year]  Transition of Care [facility]) CM/SW Contact:    Zenon Mayo, RN Phone Number: 05/19/2021, 1:24 PM  Clinical Narrative:                 Patient is from home with spouse, s/p mitral clip, she is for dc today if echo is ok.  Spouse will transport her home .    Expected Discharge Plan: Home/Self Care Barriers to Discharge: No Barriers Identified   Patient Goals and CMS Choice Patient states their goals for this hospitalization and ongoing recovery are:: return home   Choice offered to / list presented to : NA  Expected Discharge Plan and Services Expected Discharge Plan: Home/Self Care   Discharge Planning Services: CM Consult Post Acute Care Choice: NA Living arrangements for the past 2 months: Single Family Home Expected Discharge Date: 05/19/21                         [facility] Arranged: NA          Prior Living Arrangements/Services Living arrangements for the past 2 months: Single Family Home Lives with:: Spouse Patient language and need for interpreter reviewed:: Yes Do you feel safe going back to the place where you live?: Yes      Need for Family Participation in Patient Care: Yes (Comment) Care giver support system in place?: Yes (comment)   Criminal Activity/Legal Involvement Pertinent to Current Situation/Hospitalization: No - Comment as needed  Activities of Daily Living Home Assistive Devices/Equipment: Oxygen ADL Screening (condition at time of admission) Patient's cognitive ability adequate to safely complete daily activities?: Yes Is the patient deaf or have difficulty hearing?: No Does the patient have difficulty seeing, even when wearing glasses/contacts?: No Does the patient have difficulty concentrating, remembering, or making decisions?: No Patient able to express need for assistance with ADLs?: Yes Does the  patient have difficulty dressing or bathing?: No Independently performs ADLs?: Yes (appropriate for developmental age) Does the patient have difficulty walking or climbing stairs?: Yes Weakness of Legs: Both Weakness of Arms/Hands: Right  Permission Sought/Granted                  Emotional Assessment   Attitude/Demeanor/Rapport: Engaged Affect (typically observed): Appropriate Orientation: : Oriented to Situation, Oriented to  Time, Oriented to Place, Oriented to Self Alcohol / Substance Use: Not Applicable Psych Involvement: No (comment)  Admission diagnosis:  S/P mitral valve clip implantation [S01.093, Z95.818] Patient Active Problem List   Diagnosis Date Noted   Severe mitral regurgitation 05/18/2021   S/P mitral valve clip implantation 05/18/2021   Acute on chronic diastolic heart failure ([place]) 04/24/2021   PEA (Pulseless electrical activity) ([place]) 04/19/2021   Anemia due to stage 4 chronic kidney disease ([place]) 04/04/2021   Cognitive communication deficit 02/13/2021   History of cardiac arrest 02/06/2021   Right hand paresthesia 01/25/2021   Bilateral lower extremity edema 01/23/2021   Anoxic brain injury ([place]) 01/20/2021   Impaired mobility and ADLs 01/20/2021   Left-sided weakness 01/20/2021   Memory deficits 01/20/2021   Statin myopathy 01/03/2021   Secondary hyperparathyroidism of renal origin ([place]) 11/18/2020   Vitamin D deficiency 11/18/2020   Proteinuria 10/13/2020   Stage 3b chronic kidney disease ([place]) 10/13/2020   Chest pain 09/29/2020   ACS (acute coronary syndrome) (HCC)    Contrast dye induced  nephropathy, possible 09/03/2020   OSA on CPAP 09/03/2020   Need for COVID-19 vaccine 06/28/2020   Medicare annual wellness visit, subsequent 06/13/2020   Chronic respiratory failure with hypoxia ([place]) 03/23/2020   Chronic gout without tophus 03/23/2020   Pain in both hands 63/84/6659   Uncomplicated opioid dependence ([place]) 03/23/2020   Depression, major,  recurrent, mild ([place]) 12/21/2019   Hypomagnesemia 12/21/2019   Diarrhea 12/21/2019   Lumbar back pain 10/05/2019   Diabetic nephropathy associated with type 2 diabetes mellitus ([place]) 10/05/2019   Hypertensive heart and renal disease with congestive heart failure ([place]) 09/18/2019   Demand ischemia (HCC)    Diastolic heart failure ([place]) 11/07/2017   Morbid obesity with BMI of 45.0-49.9, adult ([place]) 11/07/2017   Coronary artery disease involving native coronary artery of native heart without angina pectoris 12/25/2016   Hyperlipidemia LDL goal <70 12/14/2016   Anemia 03/23/2016   Chronic pain syndrome 03/23/2016   COPD (chronic obstructive pulmonary disease) ([place]) 03/23/2016   GERD without esophagitis 03/23/2016   On home oxygen therapy 03/23/2016   S/P ablation of atrial fibrillation 10/04/2015   Diabetic polyneuropathy associated with type 2 diabetes mellitus ([place]) 04/02/2014   PCP:  [name], MD Pharmacy:   [place], [place] [facility] [place] [address] [place] [address] Phone: [phone] Fax: [phone]  [facility] ([place], [place] [facility] [address] [address] Phone: [phone] Fax: [phone]     Social Determinants of Health (SDOH) Interventions    Readmission Risk Interventions Readmission Risk Prevention Plan 05/19/2021 10/06/2020 10/04/2020  Transportation Screening Complete Complete Complete  PCP or Specialist Appt within 3-5 Days - Complete -  HRI or [place] Work Consult for [place] Planning/Counseling - Complete -  Palliative Care Screening - Not Applicable Not Applicable  Medication Review (RN Care Manager) Complete Complete Complete  PCP or Specialist appointment within 3-5 days of discharge Complete - -  [place] or Home Care Consult Complete - -  SW Recovery Care/Counseling Consult Complete - -  Palliative Care  Screening Not Applicable - -  [place] Not Applicable - -  Some recent data might be hidden

## 2021-05-19 NOTE — Discharge Summary (Addendum)
Dana [name] VALVE TEAM  Discharge Summary    Patient ID: Dana [name] MRN: [id number]; DOB: 1960/10/04  Admit date: 05/18/2021 Discharge date: 05/19/2021  Primary Care Provider: [name], MD  Primary Cardiologist: [name], MD / 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 ([place])   Diabetic nephropathy associated with type 2 diabetes mellitus (HCC)   OSA on CPAP   Stage 3b chronic kidney disease (HCC)   Severe mitral regurgitation   Allergies Allergies  Allergen Reactions   Naproxen Hives and Rash   Celecoxib Other (See Comments)    Stomach pain   Aspirin Other (See Comments)    CAN NOT TAKE DUE TO ULCERS (documented previously) but takes every day currently (as of 09/29/20) without reported issues Can take coated aspirin   Glucosamine Hives, Swelling and Other (See Comments)    Angioedema    Iron     IV iron transfusion - hives - Feb 2022 hospitalization   Metoclopramide Other (See Comments)    Facial twitching and stuttering   Metolazone Other (See Comments)    Acute renal failure   Shellfish-Derived Products Hives, Swelling and Other (See Comments)    Angioedema    Tramadol Nausea And Vomiting         Diagnostic Studies/Procedures   05/18/21 MITRAL VALVE REPAIR   Conclusion  1.  Status post transcatheter mitral edge-to-edge repair with placement of 1 XT W clip and 1 NT W clip.  The NT W clip was entangled and had to be deployed in the lateral part of the valve.  No further clip placement could be pursued due to the proximity of both clips.  The  final V waves were 52 mmHg with residual moderate to severe mitral regurgitation.  The results were reviewed with Dr. [name].   _____________    Echo 05/19/21: completed but pending formal read at the time of discharge   History of Present Illness     Dana [name] is a 60 y.o. female with a history of CAD s/p previous PCIs, CKD stage IV, PAD s/p toe amputation, PAF s/p ablation, chronic diastolic CHF, DMT1, and severe functional MR who presented to [facility] on 05/18/21 for planned mitral valve TEER.    Admitted 03/22 with NSTEMI and a/c CHF. LHC 03/22 showed moderate mid-LAD and mid-RCA disease, severe mid-L-Cx stenosis s/p PCI/DES mid Cx and side branch angioplasty of OM1.   Unfortunately she developed a contrast-induced nephropathy after cath and creatinine increased from 2.2 to a peak of 5.5. Additionally, she developed severe nausea and vomiting, gastroparesis and required IV antiplatelet therapy.  During this time she developed epistaxis and acute blood loss anemia requiring transfusion.  She was switched back to oral DAPT. Diuretics were reintroduced and her creatinine began to improve over the following days. Her leukocytosis persisted but all her infectious workup was negative and this was felt to be reactive.     Per chart review, she was admitted 6/22 to [facility] for DKA, started on insulin gtt, IVF and diuretics held. Developed pulmonary vascular congestion vs PNA and was started on IV abx, and  diuretics were resumed. She suffered a PEA arrest 01/12/21 with ROSC after 10-15 minutes of CPR and was transferred to [place]; precipitating factor for PEA arrest thought to be respiratory driven. On arrival, she was still in DKA, AKI on CKD with oliguria and Nephrology was consulted for further management. TTE with EF 60%, min MR/TR. Concern for stroke due to left-sided weakness and MRI confirmed anoxic brain injury, but no definite infarct. She was extubated, SCr returned to baseline of 1.6-1.8 and she  was discharged to rehab facility.    Zio placed 8/22 for palpitations, showing mostly SB/SR no afib, 66 SVT runs, frequent isolated SVE (11.2%).   Admitted from AHF clinic 10/22 for A/C CHF after failing outpatient escalation in diuretic therapy. She was enrolled in FASTR trial and diuresed > 15 lb.  Hospitalization complicated by AKI on CKD, Scr up to 3.13. RHC showed mild to moderate elevating filling pressures with large v-waves in PCWP tracing suggesting severe MR vs severe diastolic dysfunction. TEE showed severe central MR. She was referred to structural heart clinic to discuss management of MR.    She was seen by [name] in structural clinic on 05/05/2021. Given her multiple comorbidities, she was felt to be best suited for TEER of the mitral valve  (scheduled for 05/18/21).    Hospital Course     Consultants: none    Severe MR: s/p transcatheter mitral edge-to-edge repair with placement of 1 XT W clip and 1 NT W clip.  The NT W clip was entangled and had to be deployed in the lateral part of the valve.  No further clip placement could be pursued due to the proximity of both clips.  The final V waves were 52 mmHg with residual moderate to severe mitral regurgitation.  The results were reviewed with Dr. [name]. Continued on DAPT with aspirin and plavix. Plan for discharge home today with close follow up in the office next week.   Prolonged fluorsoscopy: she received >60 min of fluoro time. She has been advised to watch out for a rash over the next two weeks and report this to [name] if it occurs.    Acute on chronic anemia: Hg down to 7.5, which is not too far below her baseline.   Acute on chronic diastolic CHF: she was treated with one dose of IV lasix this morning and will resume home diurtiec regimen at discharge.   CKD stage IV: creat around baseline of 2.4  _____________  Discharge Vitals Blood pressure (!) 143/77, pulse 88, temperature 97.8 F (36.6 C), resp. rate 17,  height 5' 4.5" (1.638 m), weight 105 kg, SpO2 99 %.  Filed Weights   05/18/21 0552  Weight: 105 kg    GEN: appears older than stated age 53: normal Neck: no JVD or masses Cardiac: RRR; holosystolic murmur at apex. No rubs, or gallops,no edema  Respiratory:  clear to auscultation bilaterally, normal work of breathing GI: soft, nontender, nondistended, + BS MS: no deformity or atrophy Skin: warm and dry, no rash. Groin site clear without hematoma or ecchymosis  Neuro:  Alert and Oriented x 3, Strength and sensation are intact Psych: euthymic mood, full affect   Labs & Radiologic Studies    CBC Recent Labs    05/16/21 1319 05/19/21 0118  WBC 10.8* 14.0*  HGB 7.9* 7.5*  HCT 25.4* 23.9*  MCV 86.4 85.7  PLT 277 694   Basic Metabolic Panel Recent Labs    05/16/21 1319 05/19/21 0118  NA 135  133*  K 4.2 4.6  CL 103 101  CO2 22 23  GLUCOSE 251* 215*  BUN 57* 68*  CREATININE 2.24* 2.46*  CALCIUM 8.8* 8.4*   Liver Function Tests Recent Labs    05/16/21 1319  AST 19  ALT 15  ALKPHOS 87  BILITOT 0.8  PROT 7.0  ALBUMIN 3.4*   No results for input(s): LIPASE, AMYLASE in the last 72 hours. Cardiac Enzymes No results for input(s): CKTOTAL, CKMB, CKMBINDEX, TROPONINI in the last 72 hours. BNP Invalid input(s): POCBNP D-Dimer No results for input(s): DDIMER in the last 72 hours. Hemoglobin A1C No results for input(s): HGBA1C in the last 72 hours. Fasting Lipid Panel No results for input(s): CHOL, HDL, LDLCALC, TRIG, CHOLHDL, LDLDIRECT in the last 72 hours. Thyroid Function Tests No results for input(s): TSH, T4TOTAL, T3FREE, THYROIDAB in the last 72 hours.  Invalid input(s): FREET3 _____________  DG Chest 2 View  Result Date: 05/16/2021 CLINICAL DATA:  Severe mitral insufficiency. EXAM: CHEST - 2 VIEW COMPARISON:  Chest x-ray 01/24/2021. FINDINGS: The heart is mildly enlarged. There is central pulmonary vascular congestion. There is some minimal strandy  opacities in the lung bases. There is no pleural effusion or pneumothorax. No acute fractures are seen. IMPRESSION: 1. Cardiomegaly with mild interstitial edema. Electronically Signed   By: Ronney Asters M.D.   On: 05/16/2021 22:29   CARDIAC CATHETERIZATION  Result Date: 05/18/2021 1.  Status post transcatheter mitral edge-to-edge repair with placement of 1 XT W clip and 1 NT W clip.  The NT W clip was entangled and had to be deployed in the lateral part of the valve.  No further clip placement could be pursued due to the proximity of both clips.  The final V waves were 52 mmHg with residual moderate to severe mitral regurgitation.  The results were reviewed with Dr. [name].   CARDIAC CATHETERIZATION  Result Date: 04/27/2021 Findings: RA = 8 RV = 59/9 PA = 61/17 (35) PCW = mean 23 (v = 49) -> performed multiple wedges Fick cardiac output/index = PVR = 1.6 WU FA sat = 98% PA sat = 69%, 70% PaPi = 5.5 Assessment: 1. Mild to moderately elevated filling pressures with very large v-waves in PCWP tracing suggestive of severe diastolic dysfunction (has not had much MR on echo)   DG Chest Port 1 View  Result Date: 04/25/2021 CLINICAL DATA:  Chronic CHF, shortness of breath EXAM: PORTABLE CHEST 1 VIEW COMPARISON:  01/24/2021 FINDINGS: Cardiomegaly. Unchanged mediastinal contours. Redemonstrated small device projecting over the upper left cardiac silhouette. Blunting left costophrenic angle, likely a small pleural effusion. No definite right pleural effusion. Heterogeneous opacities in the lung bases. No acute osseous abnormality. IMPRESSION: Cardiomegaly, small left pleural effusion and bibasilar airspace opacities, likely edema. Electronically Signed   By: Merilyn Baba M.D.   On: 04/25/2021 11:14   ECHOCARDIOGRAM COMPLETE  Result Date: 04/25/2021    ECHOCARDIOGRAM REPORT   Patient Name:   MCCARTNEY BRUCKS Date of Exam: 04/25/2021 Medical Rec #:  [id number]      Height:       65.0 in Accession #:     [id number]     Weight:       235.0 lb Date of Birth:  Feb 08, 1961      BSA:          2.119 m Patient Age:    41 years       BP:           132/51  mmHg Patient Gender: F              HR:           72 bpm. Exam Location:  Inpatient Procedure: 2D Echo, Cardiac Doppler, Color Doppler and Intracardiac            Opacification Agent Indications:    CHF-Acute Diastolic O97.35  History:        Patient has prior history of Echocardiogram examinations, most                 recent 09/30/2020. CHF, Previous Myocardial Infarction; Risk                 Factors:Hypertension, Diabetes and Dyslipidemia. On                 [employer]-Guided diuretic therapy.  Sonographer:    Darlina Sicilian RDCS Referring Phys: [place]  1. Left ventricular ejection fraction, by estimation, is 65 to 70%. The left ventricle has normal function. The left ventricle has no regional wall motion abnormalities. Left ventricular diastolic parameters are indeterminate.  2. Right ventricular systolic function is normal. The right ventricular size is normal. Tricuspid regurgitation signal is inadequate for assessing PA pressure.  3. The mitral valve is normal in structure. Moderate mitral valve regurgitation. No evidence of mitral stenosis.  4. The aortic valve was not well visualized. Aortic valve regurgitation is not visualized.  5. The inferior vena cava is dilated in size with >50% respiratory variability, suggesting right atrial pressure of 8 mmHg. Comparison(s): A prior study was performed on 09/30/20. No significant change from prior study. Best contrast administration in this study. FINDINGS  Left Ventricle: Left ventricular ejection fraction, by estimation, is 65 to 70%. The left ventricle has normal function. The left ventricle has no regional wall motion abnormalities. Definity contrast agent was given IV to delineate the left ventricular  endocardial borders. The left ventricular internal cavity size was normal in size. There is  no left ventricular hypertrophy. Left ventricular diastolic parameters are indeterminate. Right Ventricle: The right ventricular size is normal. No increase in right ventricular wall thickness. Right ventricular systolic function is normal. Tricuspid regurgitation signal is inadequate for assessing PA pressure. Left Atrium: Left atrial size was normal in size. Right Atrium: Right atrial size was normal in size. Pericardium: There is no evidence of pericardial effusion. Mitral Valve: The mitral valve is normal in structure. Moderate mitral valve regurgitation. No evidence of mitral valve stenosis. Tricuspid Valve: The tricuspid valve is normal in structure. Tricuspid valve regurgitation is not demonstrated. No evidence of tricuspid stenosis. Aortic Valve: The aortic valve was not well visualized. Aortic valve regurgitation is not visualized. Pulmonic Valve: The pulmonic valve was normal in structure. Pulmonic valve regurgitation is trivial. No evidence of pulmonic stenosis. Aorta: The aortic root and ascending aorta are structurally normal, with no evidence of dilitation. Pulmonary Artery: The pulmonary artery is of normal size. Venous: The inferior vena cava is dilated in size with greater than 50% respiratory variability, suggesting right atrial pressure of 8 mmHg. IAS/Shunts: The atrial septum is grossly normal.  LEFT VENTRICLE PLAX 2D LVIDd:         5.15 cm      Diastology LVIDs:         3.20 cm      LV e' medial:    3.38 cm/s LV PW:         0.70 cm      LV  E/e' medial:  37.9 LV IVS:        0.90 cm      LV e' lateral:   6.15 cm/s LVOT diam:     2.00 cm      LV E/e' lateral: 20.8 LV SV:         78 LV SV Index:   37 LVOT Area:     3.14 cm  LV Volumes (MOD) LV vol d, MOD A2C: 122.0 ml LV vol d, MOD A4C: 132.0 ml LV vol s, MOD A2C: 41.1 ml LV vol s, MOD A4C: 36.9 ml LV SV MOD A2C:     80.9 ml LV SV MOD A4C:     132.0 ml LV SV MOD BP:      87.6 ml RIGHT VENTRICLE RV S prime:     16.10 cm/s TAPSE (M-mode): 2.2 cm LEFT  ATRIUM             Index LA diam:        4.10 cm 1.94 cm/m LA Vol (A2C):   33.3 ml 15.72 ml/m LA Vol (A4C):   35.9 ml 16.95 ml/m LA Biplane Vol: 36.4 ml 17.18 ml/m  AORTIC VALVE LVOT Vmax:   105.00 cm/s LVOT Vmean:  71.800 cm/s LVOT VTI:    0.249 m  AORTA Ao Root diam: 2.60 cm Ao Asc diam:  3.10 cm MITRAL VALVE MV Area (PHT): 4.49 cm       SHUNTS MV Decel Time: 169 msec       Systemic VTI:  0.25 m MR Peak grad:    80.6 mmHg    Systemic Diam: 2.00 cm MR Mean grad:    50.0 mmHg MR Vmax:         449.00 cm/s MR Vmean:        338.0 cm/s MR PISA:         1.01 cm MR PISA Eff ROA: 9 mm MR PISA Radius:  0.40 cm MV E velocity: 128.00 cm/s MV A velocity: 65.45 cm/s MV E/A ratio:  1.96 Rudean Haskell MD Electronically signed by Rudean Haskell MD Signature Date/Time: 04/25/2021/4:27:57 PM    Final    ECHO TEE  Result Date: 05/18/2021    TRANSESOPHOGEAL ECHO REPORT   Patient Name:   LAURINDA CARRENO Date of Exam: 05/18/2021 Medical Rec #:  [id number]      Height:       64.5 in Accession #:    [id number]     Weight:       231.5 lb Date of Birth:  30-Apr-1961      BSA:          2.093 m Patient Age:    25 years       BP:           105/41 mmHg Patient Gender: F              HR:           65 bpm. Exam Location:  Inpatient Procedure: 3D Echo, Cardiac Doppler, Color Doppler and Transesophageal Echo Indications:     Severe mitral regurgitation [[id number]]  History:         Patient has prior history of Echocardiogram examinations, most                  recent 05/02/2021. CHF, Previous Myocardial Infarction and CAD,                  Arrythmias:Atrial Fibrillation, Signs/Symptoms:Dyspnea; Risk  Factors:Hypertension, Dyslipidemia, Diabetes and Sleep Apnea.                  Chronic kidney disease.                   Mitral Valve: Mitral clip XT W, NT W valve is present in the                  mitral position. Procedure Date: 05/18/2021.  Sonographer:     Darlina Sicilian RDCS Referring Phys:  [id number] [place] Diagnosing Phys: Sanda Klein MD PROCEDURE: After discussion of the risks and benefits of a TEE, an informed consent was obtained from the patient. The patient was intubated. The transesophogeal probe was passed without difficulty through the esophogus of the patient. Imaged were obtained with the patient in a supine position. Sedation performed by different physician. The patient was monitored while under deep sedation. Anesthestetic sedation was provided intravenously by Anesthesiology: 150mg  of Propofol. Image quality was good. The patient developed no complications during the procedure. TEE with live 3D and 3D postprocessing was performed throughout all stages of the procedure, including transseptal puncture, device deployment, assessment of final results and evaluation for complications PREPROCEDURE FINDINGS: At baseline, left ventricular systolic function is normal. Estimated LVEF is 65%. There is hypokinesis of the basal inferolateral wall. There is severe mitral insuficiency with a central jet. There is malcoaptation of the leaflets, primarily due to tethering of the posterior leaflet. Baseline valve area by planimetry 5.7 cm sq. Mean gradient 3 mm Hg at 60 bpm. ERO area by 3D PISA was 0.7 cm sq. Regurgitant volume 132 ml, regurgitant fraction 65%. There is bilateral blunting of pulmonary vein systolic flow. There is a physiological amount of pericardial fluid. POSTPROCEDURE FINDINGS: After deployment of the initial MitraClip XTW, the device is well seated with excellent tissue bridge, but there is residual moderate mitral insufficiency lateral to the clip. A second device, Mitraclip NTW was advanced lateral to the original one. Difficulty was encountered in positioning of the second clip and it became apparent that the anterior leaflet was attached to the second clip, pulling it towards the posterior arm of the clip. Despite extensive efforts, the device could not be freed from the anterior  leaflet, nor could the posterior leaflet be grasped. The device was deployed attached only to the anterior leaflet. Left ventricular systolic function remained normal. Estimated LVEF is 65%. There is unchanged hypokinesis of the basal inferolateral wall. Both clips appear stable in their position. There is moderate to severe mitral insuficiency with major regurgitant jets between the two clips and lateral to the second clip. There is a small iatrogenic secundum atrial septal defect with exclusively left-to-right shunt. With the caveats related to regurgitation evaluation using the continuity equation, regurgitant volume was 68 ml, regurgitant fraction 45%, calculated ERO area at the end of the procedure 0.5 cm sq. There is an unchanged physiological amount of pericardial fluid.  IMPRESSIONS  1. Left ventricular ejection fraction, by estimation, is 65%%. The left ventricle has normal function. The left ventricle demonstrates regional wall motion abnormalities (see scoring diagram/findings for description). Left ventricular diastolic parameters are consistent with Grade II diastolic dysfunction (pseudonormalization). Elevated left atrial pressure.  2. Right ventricular systolic function is normal. The right ventricular size is normal.  3. Left atrial size was mildly dilated. No left atrial/left atrial appendage thrombus was detected.  4. Right atrial size was mildly dilated.  5. The mitral valve is abnormal.  Severe mitral valve regurgitation. There is a Mitral clip XT W, NT W present in the mitral position. Procedure Date: 05/18/2021.  6. The aortic valve is tricuspid. Aortic valve regurgitation is not visualized.  7. Evidence of atrial level shunting detected by color flow Doppler. There is a small patent foramen ovale with predominantly left to right shunting across the atrial septum. FINDINGS  Left Ventricle: Left ventricular ejection fraction, by estimation, is 65%%. The left ventricle has normal function. The  left ventricle demonstrates regional wall motion abnormalities. The left ventricular internal cavity size was normal in size. There is no left ventricular hypertrophy. Left ventricular diastolic parameters are consistent with Grade II diastolic dysfunction (pseudonormalization). Right Ventricle: The right ventricular size is normal. No increase in right ventricular wall thickness. Right ventricular systolic function is normal. Left Atrium: Left atrial size was mildly dilated. No left atrial/left atrial appendage thrombus was detected. Right Atrium: Right atrial size was mildly dilated. Pericardium: Trivial pericardial effusion is present. Mitral Valve: The mitral valve is abnormal. Severe mitral valve regurgitation. There is a Mitral clip XT W, NT W present in the mitral position. Procedure Date: 05/18/2021. Tricuspid Valve: The tricuspid valve is normal in structure. Tricuspid valve regurgitation is trivial. Aortic Valve: The aortic valve is tricuspid. Aortic valve regurgitation is not visualized. Pulmonic Valve: The pulmonic valve was normal in structure. Pulmonic valve regurgitation is not visualized. Aorta: The aortic root, ascending aorta and aortic arch are all structurally normal, with no evidence of dilitation or obstruction. IAS/Shunts: Evidence of atrial level shunting detected by color flow Doppler. A small patent foramen ovale is detected with predominantly left to right shunting across the atrial septum.  LEFT VENTRICLE PLAX 2D LVOT diam:     2.10 cm LV SV:         67 LV SV Index:   32 LVOT Area:     3.46 cm  AORTIC VALVE LVOT Vmax:   78.40 cm/s LVOT Vmean:  57.600 cm/s LVOT VTI:    0.194 m MR Peak grad:      111.3 mmHg MR Mean grad:      84.0 mmHg    SHUNTS MR Vmax:           527.50 cm/s  Systemic VTI:  0.19 m MR Vmean:          448.0 cm/s   Systemic Diam: 2.10 cm MR Vena Contracta: 0.52 cm MR PISA:           2.52 cm MR PISA Radius:    0.63 cm Dani Gobble Croitoru MD Electronically signed by Sanda Klein  MD Signature Date/Time: 05/18/2021/3:05:55 PM    Final    ECHO TEE  Result Date: 04/28/2021    TRANSESOPHOGEAL ECHO REPORT   Patient Name:   AUBRIA VANECEK Date of Exam: 04/28/2021 Medical Rec #:  [id number]      Height:       65.0 in Accession #:    [id number]     Weight:       222.9 lb Date of Birth:  Aug 01, 1960      BSA:          2.071 m Patient Age:    32 years       BP:           141/57 mmHg Patient Gender: F              HR:           69 bpm. Exam Location:  Inpatient Procedure: 2D Echo, Cardiac Doppler, Color Doppler and 3D Echo Indications:     Mitral regurgitation  History:         Patient has prior history of Echocardiogram examinations, most                  recent 04/25/2021. CHF, Previous Myocardial Infarction; Risk                  Factors:Hypertension, Diabetes and Dyslipidemia.  Sonographer:     Clayton Lefort RDCS (AE) Referring Phys:  [place] Diagnosing Phys: [name] MD PROCEDURE: The transesophogeal probe was passed without difficulty through the esophogus of the patient. Sedation performed by different physician. The patient was monitored while under deep sedation. Anesthestetic sedation was provided intravenously by Anesthesiology: 242.64mg  of Propofol, 50mg  of Lidocaine. Image quality was good. The patient's vital signs; including heart rate, blood pressure, and oxygen saturation; remained stable throughout the procedure. The patient developed no complications during the procedure. IMPRESSIONS  1. Left ventricular ejection fraction, by estimation, is 60 to 65%. The left ventricle has normal function. The left ventricle demonstrates regional wall motion abnormalities (see scoring diagram/findings for description). There is moderate concentric left ventricular hypertrophy. There is akinesis of the left ventricular, basal inferior wall.  2. Right ventricular systolic function is normal. The right ventricular size is normal.  3. Left atrial size was moderately dilated. No  left atrial/left atrial appendage thrombus was detected.  4. The MV annulus is dilated. There is mild restrictiction of P2. Severe central MR (4+) with prominent flow reversal in the pulmonary veins. The mitral valve is normal in structure. Severe mitral valve regurgitation. No evidence of mitral stenosis.  5. The aortic valve is normal in structure. Aortic valve regurgitation is not visualized. No aortic stenosis is present.  6. There is Moderate (Grade III) plaque involving the descending aorta.  7. The inferior vena cava is normal in size with greater than 50% respiratory variability, suggesting right atrial pressure of 3 mmHg.  8. There is a small patent foramen ovale. Conclusion(s)/Recommendation(s): Normal biventricular function without evidence of hemodynamically significant valvular heart disease. FINDINGS  Left Ventricle: Left ventricular ejection fraction, by estimation, is 60 to 65%. The left ventricle has normal function. The left ventricle demonstrates regional wall motion abnormalities. The left ventricular internal cavity size was normal in size. There is moderate concentric left ventricular hypertrophy. Right Ventricle: The right ventricular size is normal. No increase in right ventricular wall thickness. Right ventricular systolic function is normal. Left Atrium: Left atrial size was moderately dilated. No left atrial/left atrial appendage thrombus was detected. Right Atrium: Right atrial size was normal in size. Pericardium: There is no evidence of pericardial effusion. Mitral Valve: The MV annulus is dilated. There is mild restrictiction of P2. Severe central MR (4+) with prominent flow reversal in the pulmonary veins. The mitral valve is normal in structure. Severe mitral valve regurgitation, with centrally-directed jet. No evidence of mitral valve stenosis. Tricuspid Valve: The tricuspid valve is normal in structure. Tricuspid valve regurgitation is mild . No evidence of tricuspid stenosis.  Aortic Valve: The aortic valve is normal in structure. Aortic valve regurgitation is not visualized. No aortic stenosis is present. Pulmonic Valve: The pulmonic valve was normal in structure. Pulmonic valve regurgitation is trivial. No evidence of pulmonic stenosis. Aorta: The aortic root is normal in size and structure. There is moderate (Grade III) plaque involving the descending aorta. Venous: The inferior vena cava is normal  in size with greater than 50% respiratory variability, suggesting right atrial pressure of 3 mmHg. IAS/Shunts: No atrial level shunt detected by color flow Doppler. A small patent foramen ovale is detected.  MR Peak grad:    138.3 mmHg MR Mean grad:    84.0 mmHg MR Vmax:         588.00 cm/s MR Vmean:        422.0 cm/s MR PISA:         4.02 cm MR PISA Eff ROA: 21 mm MR PISA Radius:  0.80 cm [name] MD Electronically signed by [name] MD Signature Date/Time: 04/28/2021/6:00:45 PM    Final    Disposition   Pt is being discharged home today in good condition.  Follow-up Plans & Appointments     Follow-up Information     Eileen Stanford, PA-C. Go on 05/25/2021.   Specialties: Cardiology, Radiology Why: @ 1pm, please arrive at least 10 minutes early. Contact information: [address] [name] [address] [phone]                Discharge Instructions     Amb Referral to Cardiac Rehabilitation   Complete by: As directed    Diagnosis: Valve Repair   Valve: Mitral Comment - mitraclip   After initial evaluation and assessments completed: Virtual Based Care may be provided alone or in conjunction with Phase 2 Cardiac Rehab based on patient barriers.: Yes       Discharge Medications   Allergies as of 05/19/2021       Reactions   Naproxen Hives, Rash   Celecoxib Other (See Comments)   Stomach pain   Aspirin Other (See Comments)   CAN NOT TAKE DUE TO ULCERS (documented previously) but takes every day currently (as  of 09/29/20) without reported issues Can take coated aspirin   Glucosamine Hives, Swelling, Other (See Comments)   Angioedema   Iron    IV iron transfusion - hives - Feb 2022 hospitalization   Metoclopramide Other (See Comments)   Facial twitching and stuttering   Metolazone Other (See Comments)   Acute renal failure   Shellfish-derived Products Hives, Swelling, Other (See Comments)   Angioedema   Tramadol Nausea And Vomiting           Medication List     TAKE these medications    allopurinol 100 MG tablet Commonly known as: ZYLOPRIM Take 1 tablet (100 mg total) by mouth daily.   aspirin 81 MG EC tablet Take 1 tablet (81 mg total) by mouth daily. Swallow whole.   BD Pen Needle Nano 2nd Gen 32G X 4 MM Misc Generic drug: Insulin Pen Needle 4 (four) times daily.   carvedilol 12.5 MG tablet Commonly known as: COREG Take 1 tablet (12.5 mg total) by mouth 2 (two) times daily.   clopidogrel 75 MG tablet Commonly known as: PLAVIX Take 1 tablet (75 mg total) by mouth at bedtime.   Cyanocobalamin 3000 MCG Caps Take 2 capsules by mouth daily. Patient purchased 3000 mcg gummies and taking 2 gummies daily   EPINEPHrine 0.3 mg/0.3 mL Soaj injection Commonly known as: EPI-PEN Use as directed for life-threatening allergic reaction.   ezetimibe 10 MG tablet Commonly known as: ZETIA Take 1 tablet (10 mg total) by mouth daily.   famotidine 20 MG tablet Commonly known as: PEPCID Take 1 tablet (20 mg total) by mouth daily.   ferrous sulfate 325 (65 FE) MG tablet Take 325 mg by mouth 2 (  two) times daily with a meal.   FISH OIL PO Take 1 capsule by mouth daily.   hydrALAZINE 25 MG tablet Commonly known as: APRESOLINE Take 1 tablet (25 mg total) by mouth 3 (three) times daily.   HYDROcodone-acetaminophen 7.5-325 MG tablet Commonly known as: NORCO Take 1 tablet by mouth 3 (three) times daily as needed for moderate pain.   insulin aspart 100 UNIT/ML injection Commonly  known as: novoLOG Inject 2-10 Units into the skin 3 (three) times daily before meals.   isosorbide mononitrate 30 MG 24 hr tablet Commonly known as: IMDUR Take 1 tablet (30 mg total) by mouth daily.   lactulose 10 GM/15ML solution Commonly known as: CHRONULAC Take 15 mLs (10 g total) by mouth daily as needed for mild constipation.   latanoprost 0.005 % ophthalmic solution Commonly known as: XALATAN Place 1 drop into both eyes at bedtime.   Levemir FlexTouch 100 UNIT/ML FlexPen Generic drug: insulin detemir Inject 50 Units into the skin daily.   LORazepam 0.5 MG tablet Commonly known as: ATIVAN Take 1 tablet (0.5 mg total) by mouth daily as needed for anxiety.   magnesium oxide 400 MG tablet Commonly known as: MAG-OX Take 2 tablets (800 mg total) by mouth daily. Take 2 (400 mg) daily=800 mg   nitrofurantoin (macrocrystal-monohydrate) 100 MG capsule Commonly known as: Macrobid Take 1 capsule (100 mg total) by mouth 2 (two) times daily.   nitroGLYCERIN 0.4 MG SL tablet Commonly known as: NITROSTAT Place 1 tablet (0.4 mg total) under the tongue every 5 (five) minutes as needed for chest pain.   OXYGEN Inhale 2 L into the lungs at bedtime.   pantoprazole 40 MG tablet Commonly known as: PROTONIX Take 1 tablet (40 mg total) by mouth daily.   potassium chloride SA 20 MEQ tablet Commonly known as: KLOR-CON Take 1 tablet (20 mEq total) by mouth daily.   Probiotic Caps Take 1 capsule by mouth daily.   promethazine 25 MG tablet Commonly known as: PHENERGAN Take 1 tablet (25 mg total) by mouth every 6 (six) hours as needed for nausea or vomiting.   Repatha SureClick 384 MG/ML Soaj Generic drug: Evolocumab Inject 1 Dose into the skin every 14 (fourteen) days.   torsemide 20 MG tablet Commonly known as: DEMADEX Take 3 tablets (60 mg total) by mouth daily.   Vitamin D 50 MCG (2000 UT) tablet Take 2,000 Units by mouth daily. Take one daily            Outstanding  Labs/Studies   none  Duration of Discharge Encounter   Greater than 30 minutes including physician time.  Signed, Angelena Form, PA-C 05/19/2021, 12:11 PM [phone]  ATTENDING ATTESTATION:  After conducting a review of all available clinical information with the care team, interviewing the patient, and performing a physical exam, I agree with the findings and plan described in this note.  Patient is stable after difficult MitraClip procedure.  She was left with moderate to severe mitral regurgitation with V waves down to 52 mmHg from 56 mmHg.  Limited echocardiographic images today demonstrated perhaps slightly less mitral regurgitation.  We will await a full echocardiogram in the future to evaluate further.  She is stable for discharge home with follow-up as previously scheduled.  Lenna Sciara, MD Pager [phone]

## 2021-05-19 NOTE — Progress Notes (Signed)
  Echocardiogram 2D Echocardiogram has been performed.  Dana [name] 05/19/2021, 11:39 AM

## 2021-05-19 NOTE — Plan of Care (Signed)

## 2021-05-22 ENCOUNTER — Telehealth: Payer: Self-pay

## 2021-05-22 ENCOUNTER — Telehealth (HOSPITAL_COMMUNITY): Payer: Self-pay

## 2021-05-22 NOTE — Telephone Encounter (Signed)
Per phase I cardiac rehab, fax cardiac rehab referral to [facility] cardiac rehab.

## 2021-05-22 NOTE — Telephone Encounter (Signed)
Transition Care Management Follow-up Telephone Call Date of discharge and from where: 05/19/2021 How have you been since you were released from the hospital? " Doing ok, a little tired and winded but ok" Any questions or concerns? No  Items Reviewed: Did the pt receive and understand the discharge instructions provided? Yes  Medications obtained and verified? Yes  Other? No  Any new allergies since your discharge? No  Dietary orders reviewed? Yes ( heart healthy  and low sodium) Do you have support at home? Yes   Home Care and Equipment/Supplies: Were home health services ordered? no If so, what is the name of the agency? N/a  Has the agency set up a time to come to the patient's home? not applicable Were any new equipment or medical supplies ordered?  Yes: walker What is the name of the medical supply agency? [facility] Were you able to get the supplies/equipment? yes Do you have any questions related to the use of the equipment or supplies? No  Functional Questionnaire: (I = Independent and D = Dependent) ADLs: I  Bathing/Dressing- I  Meal Prep- I  Eating- I  Maintaining continence- I  Transferring/Ambulation- I  Managing Meds- I  Follow up appointments reviewed:  PCP Hospital f/u appt confirmed? No  Scheduled to see  Specialist Hospital f/u appt confirmed? Yes  Scheduled to see Dr. Angelena Form on 05/25/2021 @ 1:00pm. Are transportation arrangements needed? No  If their condition worsens, is the pt aware to call PCP or go to the Emergency Dept.? Yes Was the patient provided with contact information for the PCP's office or ED? Yes Was to pt encouraged to call back with questions or concerns? Yes  Quinn Plowman RN,BSN,CCM RN Case Manager [place] [phone]

## 2021-05-23 ENCOUNTER — Telehealth: Payer: Self-pay | Admitting: Physician Assistant

## 2021-05-23 NOTE — Telephone Encounter (Signed)
  [facility] VALVE TEAM   Patient contacted regarding discharge from [facility] on 11/4  Patient understands to follow up with provider Nell Range on 11/10 at [facility].  Patient understands discharge instructions? yes Patient understands medications and regimen? yes Patient understands to bring all medications to this visit? yes  Angelena Form PA-C  [facility]

## 2021-05-24 ENCOUNTER — Telehealth: Payer: Self-pay

## 2021-05-24 NOTE — Progress Notes (Signed)
HEART AND [place]                                     Cardiology Office Note:    Date:  05/25/2021   ID:  Dana [name], DOB Nov 29, 1960, MRN [id number]  PCP:  [name], MD  [facility] Cardiologist:  [name], MD  / [name] (structural) [place] Electrophysiologist:  None   Referring MD: [name], MD   [facility] s/p Clip  History of Present Illness:    Dana [name] is a 60 y.o. female with a hx of CAD s/p previous PCIs, CKD stage IV, PAD s/p toe amputation, PAF s/p ablation, chronic diastolic CHF, DMT1, and severe functional MR s/p TEER (05/18/21) who presents to clinic for follow up.   Admitted 03/22 with NSTEMI and a/c CHF. LHC 03/22 showed moderate mid-LAD and mid-RCA disease, severe mid-L-Cx stenosis s/p PCI/DES mid Cx and side branch angioplasty of OM1.   Unfortunately she developed a contrast-induced nephropathy after cath and creatinine increased from 2.2 to a peak of 5.5. Additionally, she developed severe nausea and vomiting, gastroparesis and required IV antiplatelet therapy.  During this time she developed epistaxis and acute blood loss anemia requiring transfusion.  She was switched back to oral DAPT. Diuretics were reintroduced and her creatinine began to improve over the following days. Her leukocytosis persisted but all her infectious workup was negative and this was felt to be reactive.     Per chart review, she was admitted 6/22 to [facility] for DKA, started on insulin gtt, IVF and diuretics held. Developed pulmonary vascular congestion vs PNA and was started on IV abx, and diuretics were resumed. She suffered a PEA arrest 01/12/21 with ROSC after 10-15 minutes of CPR and was transferred to [facility]; precipitating factor for PEA arrest thought to be respiratory driven. On arrival, she was still in DKA, AKI on CKD with oliguria and Nephrology was consulted for further management. TTE with EF 60%, min MR/TR.  Concern for stroke due to left-sided weakness and MRI confirmed anoxic brain injury, but no definite infarct. She was extubated, SCr returned to baseline of 1.6-1.8 and she was discharged to rehab facility.    Zio placed 8/22 for palpitations, showing mostly SB/SR no afib, 66 SVT runs, frequent isolated SVE (11.2%).   Admitted from AHF clinic 10/22 for A/C CHF after failing outpatient escalation in diuretic therapy. She was enrolled in FASTR trial and diuresed > 15 lb.  Hospitalization complicated by AKI on CKD, Scr up to 3.13. RHC showed mild to moderate elevating filling pressures with large v-waves in PCWP tracing suggesting severe MR vs severe diastolic dysfunction. TEE showed severe central MR. She was referred to structural heart clinic to discuss management of MR.    She underwent transcatheter mitral edge-to-edge repair with placement of 1 XT W clip and 1 NT W clip.  The NT W clip was entangled and had to be deployed in the lateral part of the valve.  No further clip placement could be pursued due to the proximity of both clips.  The final V waves were 52 mmHg with residual moderate to severe mitral regurgitation. She was continued on DAPT with aspirin and plavix. POD1 echo showed EF 50-55%, normally functioning clips with moderate residual MR with a mean gradient of 5 mm hg. Pulmonary vein flow could not be obtained.  Today the patient presents to clinic for follow up.  She is here with her husband today. She generally feels unwell.  She has chronic dyspnea on exertion that is unchanged since her MitraClip procedure.  She has had some mild worsening in her lower extremity edema.  She denies chest pain.  She deals with palpitations pretty regularly and worries about atrial fibrillation.  They had many questions about the procedure and potential plans for future MitraClip placement.   Past Medical History:  Diagnosis Date   Acquired absence of left great toe ([place])    Acquired absence of other  left toe(s) ([place])    Anemia    Anxiety    CAD (coronary artery disease)    DES mLAD 04/07/15; DES dRCA & DES pPDA 08/30/16; DES mCX & PTCA OM1 09/29/20   Chronic diastolic (congestive) heart failure (HCC)    Chronic systolic (congestive) heart failure (HCC)    CKD (chronic kidney disease)    Contrast dye induced nephropathy, possible 09/03/2020   Diabetes (HCC)    Diastolic CHF (HCC)    Dyspnea    Gastro-esophageal reflux disease without esophagitis    Hyperlipidemia    Hypertension    Hypertensive heart disease with heart failure (HCC)    Hypoxemia    Idiopathic gout, multiple sites    Localized edema    Low back pain    Mitral regurgitation    Mixed hyperlipidemia    Mixed incontinence    Morbid (severe) obesity due to excess calories (HCC)    Neuromuscular disorder (HCC)    Neuropathy    Non-ST elevation (NSTEMI) myocardial infarction ([place])    08/29/20, 09/29/20   OSA (obstructive sleep apnea) 09/03/2020   Other specified hypothyroidism    PAF (paroxysmal atrial fibrillation) ([place])    s/p pulmonary vein isolation by cryoablation 10/04/15 Dr. Minna Merritts in [facility]   PEA (Pulseless electrical activity) [facility])    ~ 01/13/21 at [facility] during admission DKA, volume overload, possible CAP, hypoxia s/p ACLS with ROSC ~ 10-15   Pneumonia    Primary insomnia    Type 2 diabetes mellitus with diabetic nephropathy ([place])     Past Surgical History:  Procedure Laterality Date   ABDOMINAL HYSTERECTOMY     Partial- Still has both ovaries   CARDIOVASCULAR STRESS TEST  08/13/2014   Nuclear; Normal   CARPAL TUNNEL RELEASE     CATARACT EXTRACTION, BILATERAL     CESAREAN SECTION     CHOLECYSTECTOMY     CORONARY ANGIOPLASTY WITH STENT PLACEMENT     3 blockages/ 1 stent   CORONARY STENT INTERVENTION N/A 09/29/2020   Procedure: CORONARY STENT INTERVENTION;  Surgeon: Sherren Mocha, MD;  Location: [place] CV LAB;  Service: Cardiovascular;  Laterality: N/A;  CFX   EYE SURGERY     LEFT  HEART CATH AND CORONARY ANGIOGRAPHY N/A 09/29/2020   Procedure: LEFT HEART CATH AND CORONARY ANGIOGRAPHY;  Surgeon: Sherren Mocha, MD;  Location: [place] CV LAB;  Service: Cardiovascular;  Laterality: N/A;   MITRAL VALVE REPAIR N/A 05/18/2021   Procedure: MITRAL VALVE REPAIR;  Surgeon: Early Osmond, MD;  Location: [place] CV LAB;  Service: Cardiovascular;  Laterality: N/A;   RIGHT HEART CATH N/A 04/27/2021   Procedure: RIGHT HEART CATH;  Surgeon: Jolaine Artist, MD;  Location: [place] CV LAB;  Service: Cardiovascular;  Laterality: N/A;   RIGHT/LEFT HEART CATH AND CORONARY ANGIOGRAPHY N/A 01/07/2017   Procedure: Right/Left Heart Cath and Coronary Angiography;  Surgeon: Larey Dresser, MD;  Location: [place] CV LAB;  Service: Cardiovascular;  Laterality: N/A;   ROTATOR CUFF REPAIR     TEE WITHOUT CARDIOVERSION N/A 04/28/2021   Procedure: TRANSESOPHAGEAL ECHOCARDIOGRAM (TEE);  Surgeon: Jolaine Artist, MD;  Location: [facility] ENDOSCOPY;  Service: Cardiovascular;  Laterality: N/A;   TEE WITHOUT CARDIOVERSION N/A 05/18/2021   Procedure: TRANSESOPHAGEAL ECHOCARDIOGRAM (TEE);  Surgeon: Early Osmond, MD;  Location: [place] CV LAB;  Service: Cardiovascular;  Laterality: N/A;   TOE AMPUTATION Left    US ECHOCARDIOGRAPHY  05/2017   Normal    Current Medications: Current Meds  Medication Sig   allopurinol (ZYLOPRIM) 100 MG tablet Take 1 tablet (100 mg total) by mouth daily.   amoxicillin (AMOXIL) 500 MG tablet Take 4 tablets by mouth 1 hour prior to dental procedures and cleanings   aspirin EC 81 MG EC tablet Take 1 tablet (81 mg total) by mouth daily. Swallow whole.   BD PEN NEEDLE NANO 2ND GEN 32G X 4 MM MISC 4 (four) times daily.   carvedilol (COREG) 12.5 MG tablet Take 1 tablet (12.5 mg total) by mouth 2 (two) times daily.   Cholecalciferol (VITAMIN D) 50 MCG (2000 UT) tablet Take 2,000 Units by mouth daily. Take one daily   clopidogrel (PLAVIX) 75 MG tablet Take 1  tablet (75 mg total) by mouth at bedtime.   Cyanocobalamin 3000 MCG CAPS Take 2 capsules by mouth daily. Patient purchased 3000 mcg gummies and taking 2 gummies daily   EPINEPHrine 0.3 mg/0.3 mL IJ SOAJ injection Use as directed for life-threatening allergic reaction.   Evolocumab (REPATHA SURECLICK) 627 MG/ML SOAJ Inject 1 Dose into the skin every 14 (fourteen) days.   ezetimibe (ZETIA) 10 MG tablet Take 1 tablet (10 mg total) by mouth daily.   famotidine (PEPCID) 20 MG tablet Take 1 tablet (20 mg total) by mouth daily.   ferrous sulfate 325 (65 FE) MG tablet Take 325 mg by mouth 2 (two) times daily with a meal.   hydrALAZINE (APRESOLINE) 25 MG tablet Take 1 tablet (25 mg total) by mouth 3 (three) times daily.   HYDROcodone-acetaminophen (NORCO) 7.5-325 MG tablet Take 1 tablet by mouth 3 (three) times daily as needed for moderate pain.   insulin aspart (NOVOLOG) 100 UNIT/ML injection Inject 2-10 Units into the skin 3 (three) times daily before meals.   isosorbide mononitrate (IMDUR) 30 MG 24 hr tablet Take 1 tablet (30 mg total) by mouth daily.   lactulose (CHRONULAC) 10 GM/15ML solution Take 15 mLs (10 g total) by mouth daily as needed for mild constipation.   latanoprost (XALATAN) 0.005 % ophthalmic solution Place 1 drop into both eyes at bedtime.   LEVEMIR FLEXTOUCH 100 UNIT/ML FlexPen Inject 50 Units into the skin daily.   LORazepam (ATIVAN) 0.5 MG tablet Take 1 tablet (0.5 mg total) by mouth daily as needed for anxiety.   magnesium oxide (MAG-OX) 400 MG tablet Take 2 tablets (800 mg total) by mouth daily. Take 2 (400 mg) daily=800 mg   Omega-3 Fatty Acids (FISH OIL PO) Take 1 capsule by mouth daily.   OXYGEN Inhale 2 L into the lungs at bedtime.   pantoprazole (PROTONIX) 40 MG tablet Take 1 tablet (40 mg total) by mouth daily.   potassium chloride SA (KLOR-CON) 20 MEQ tablet Take 1 tablet (20 mEq total) by mouth daily.   Probiotic CAPS Take 1 capsule by mouth daily.   promethazine  (PHENERGAN) 25 MG tablet Take 1 tablet (25  mg total) by mouth every 6 (six) hours as needed for nausea or vomiting.   torsemide (DEMADEX) 20 MG tablet Take 3 tablets (60 mg total) by mouth daily.   [DISCONTINUED] nitroGLYCERIN (NITROSTAT) 0.4 MG SL tablet Place 1 tablet (0.4 mg total) under the tongue every 5 (five) minutes as needed for chest pain.     Allergies:   Naproxen, Celecoxib, Aspirin, Glucosamine, Iron, Metoclopramide, Metolazone, Shellfish-derived products, and Tramadol   Social History   Socioeconomic History   Marital status: Married    Spouse name: Not on file   Number of children: Not on file   Years of education: Not on file   Highest education level: Not on file  Occupational History   Not on file  Tobacco Use   Smoking status: Former   Smokeless tobacco: Never  Vaping Use   Vaping Use: Never used  Substance and Sexual Activity   Alcohol use: Not Currently   Drug use: Not Currently   Sexual activity: Not on file  Other Topics Concern   Not on file  Social History Narrative   Not on file   Social Determinants of Health   Financial Resource Strain: Low Risk    Difficulty of Paying Living Expenses: Not very hard  Food Insecurity: No Food Insecurity   Worried About Running Out of Food in the Last Year: Never true   Ran Out of Food in the Last Year: Never true  Transportation Needs: No Transportation Needs   Lack of Transportation (Medical): No   Lack of Transportation (Non-Medical): No  Physical Activity: Not on file  Stress: Not on file  Social Connections: Not on file     Family History: The patient's family history includes Bipolar disorder in an other family member; Breast cancer in her mother; Coronary artery disease in her mother; Depression in an other family member; Diabetes type II in her mother; Hyperlipidemia in her mother; Hypertension in her father and mother; Lung cancer in her mother; Schizophrenia in an other family member.  ROS:    Please see the history of present illness.    All other systems reviewed and are negative.  EKGs/Labs/Other Studies Reviewed:    The following studies were reviewed today:  05/18/21 MITRAL VALVE REPAIR    Conclusion   1.  Status post transcatheter mitral edge-to-edge repair with placement of 1 XT W clip and 1 NT W clip.  The NT W clip was entangled and had to be deployed in the lateral part of the valve.  No further clip placement could be pursued due to the proximity of both clips.  The final V waves were 52 mmHg with residual moderate to severe mitral regurgitation.  The results were reviewed with Dr. [name].   _____________     Echo 05/19/21: IMPRESSIONS   1. Left ventricular ejection fraction, by estimation, is 50 to 55%. The  left ventricle has low normal function. The left ventricle demonstrates  regional wall motion abnormalities (see scoring diagram/findings for  description). Left ventricular diastolic   function could not be evaluated. Elevated left atrial pressure. There is  severe hypokinesis of the left ventricular, basal-mid inferolateral wall.   2. Right ventricular systolic function is mildly reduced. The right  ventricular size is mildly enlarged. Tricuspid regurgitation signal is  inadequate for assessing PA pressure.   3. Left atrial size was mildly dilated.   4. There are two MitraClips that appear well-seated, stable in position.  residual MR is seen from the central  area of the commisure, between the  clips. Images are technically difficult, but overall MR severity is  probably moderate. Pulmonary vein flow  could not be obtained. The mitral valve has been repaired/replaced.  Moderate mitral valve regurgitation. The mean mitral valve gradient is 5.0  mmHg. Procedure Date: 05/18/2021.   5. The aortic valve is tricuspid. Aortic valve regurgitation is not  visualized. No aortic stenosis is present.   6. The inferior vena cava is dilated in size with <50%  respiratory  variability, suggesting right atrial pressure of 15 mmHg.   Comparison(s): Images are tehnically difficult. Overall mitral  insufficiency appears mild-to-moderate on images available, but is  probably at least moderate.    EKG:  EKG is NOT ordered today.    Recent Labs: 10/12/2020: NT-Pro BNP 3,407 04/12/2021: B Natriuretic Peptide 353.3 04/24/2021: TSH 4.014 05/08/2021: Magnesium 2.3 05/16/2021: ALT 15 05/19/2021: BUN 68; Creatinine, Ser 2.46; Hemoglobin 7.5; Platelets 246; Potassium 4.6; Sodium 133  Recent Lipid Panel    Component Value Date/Time   CHOL 175 10/17/2020 0847   TRIG 130 10/17/2020 0847   HDL 43 10/17/2020 0847   CHOLHDL 4.1 10/17/2020 0847   CHOLHDL 2.4 09/30/2020 0241   VLDL 15 09/30/2020 0241   LDLCALC 109 (H) 10/17/2020 0847     Risk Assessment/Calculations:       Physical Exam:    VS:  BP (!) 112/58   Pulse 71   Ht 5' 4.5" (1.638 m)   Wt 237 lb (107.5 kg)   SpO2 97%   BMI 40.05 kg/m     Wt Readings from Last 3 Encounters:  05/25/21 237 lb (107.5 kg)  05/18/21 231 lb 8 oz (105 kg)  05/16/21 231 lb 8 oz (105 kg)     GEN:  Well nourished, well developed in no acute distress,  HEENT: Normal NECK: No JVD LYMPHATICS: No lymphadenopathy CARDIAC: RRR, distant heart sounds.  Very soft holosystolic murmur at apex.  No rubs, gallops RESPIRATORY:  Clear to auscultation without rales, wheezing or rhonchi  ABDOMEN: Soft, non-tender, non-distended MUSCULOSKELETAL:  No edema; No deformity  SKIN: Warm and dry.  Groin site healing well.  2+ bilateral lower extremity edema, left more than right NEUROLOGIC:  Alert and oriented x 3 PSYCHIATRIC:  Normal affect   ASSESSMENT:    1. S/P mitral valve clip implantation   2. Anemia, unspecified type   3. Chronic diastolic CHF (congestive heart failure) (HCC)   4. Stage 3b chronic kidney disease ([place])   5. Coronary artery disease involving native heart without angina pectoris, unspecified vessel or  lesion type    PLAN:    In order of problems listed above:  Severe MR s/p TEER: doing okay 1 week out from MitraClip.  Has not had any noticeable improvement in her breathing.  Postop day 1 echo showed moderate residual MR, which was better than expected given difficulty during the case.  Per discussion with [name], we could potentially consider reclipping based on subsequent echocardiograms, but will need to wait at least 3 months to allow for adequate fibrosis of the 2 previously placed clips.  She has been continued on DAPT with aspirin and plavix. SBE prophylaxis discussed; I have RX'd amoxicillin.  We will see her back in 4 to 6 weeks for repeat echo and follow-up.   Acute on chronic anemia: Hg down to 7.5, which is not too far below her baseline.    Acute on chronic diastolic CHF: She was treated with 1 dose  of IV Lasix prior to discharge.  Her weight is up 6 pounds.  She does have a CardioMEMS in place and has an appointment with the advanced heart failure clinic at 2:30 today.  I have discussed case with the APP, Marlyce Huge, seeing her today and she will adjust her diuretic according to device reading and physical exam.   CKD stage IV: creat around baseline of 2.4  CAD: continue on statin and antiplatelets. Needs DAPT for at least a year (09/2021) given PCI in the setting of NSTEMI.   PAF: s/p remote ablation in [place]. Has ongoing palpitations. Zio XT 14 day (8/22) showed mostly SB/SR no afib, 66 SVT runs, frequent isolated SVE (11.2%). Continue Coreg 12.5mg  BID.  No oral anticoagulation in the setting of GI bleeding, need for DAPT and no recent A. fib recurrence.   Medication Adjustments/Labs and Tests Ordered: Current medicines are reviewed at length with the patient today.  Concerns regarding medicines are outlined above.  No orders of the defined types were placed in this encounter.  Meds ordered this encounter  Medications   nitroGLYCERIN (NITROSTAT) 0.4 MG SL tablet     Sig: Place 1 tablet (0.4 mg total) under the tongue every 5 (five) minutes as needed for chest pain.    Dispense:  25 tablet    Refill:  3   amoxicillin (AMOXIL) 500 MG tablet    Sig: Take 4 tablets by mouth 1 hour prior to dental procedures and cleanings    Dispense:  12 tablet    Refill:  6     Patient Instructions  Medication Instructions:  Start Amoxicillin 500 mg, take 4 tablets by mouth 1 hour prior to dental procedures and cleanings   *If you need a refill on your cardiac medications before your next appointment, please call your pharmacy*   Lab Work: None ordered   If you have labs (blood work) drawn today and your tests are completely normal, you will receive your results only by: MyChart Message (if you have MyChart) OR A paper copy in the mail If you have any lab test that is abnormal or we need to change your treatment, we will call you to review the results.   Testing/Procedures: None ordered    Follow-Up: Follow up as scheduled    Other Instructions None     Signed, Angelena Form, PA-C  05/25/2021 2:10 PM     [facility]

## 2021-05-24 NOTE — Progress Notes (Signed)
I called pt and reminding her of her telephone visit with CPP on 11/10/ @ 10:00am. Pt confirmed.  Elray Mcgregor, [place] Pharmacist Assistant  [phone]

## 2021-05-25 ENCOUNTER — Ambulatory Visit (INDEPENDENT_AMBULATORY_CARE_PROVIDER_SITE_OTHER): Payer: HMO | Admitting: Physician Assistant

## 2021-05-25 ENCOUNTER — Ambulatory Visit: Payer: HMO

## 2021-05-25 ENCOUNTER — Encounter: Payer: Self-pay | Admitting: Physician Assistant

## 2021-05-25 ENCOUNTER — Ambulatory Visit (HOSPITAL_COMMUNITY)
Admission: RE | Admit: 2021-05-25 | Discharge: 2021-05-25 | Disposition: A | Discharge status: Home / Self Care | Payer: HMO | Source: Ambulatory Visit | Attending: Cardiology | Admitting: Cardiology

## 2021-05-25 ENCOUNTER — Other Ambulatory Visit: Payer: Self-pay

## 2021-05-25 ENCOUNTER — Encounter (HOSPITAL_COMMUNITY): Payer: Self-pay

## 2021-05-25 ENCOUNTER — Encounter: Payer: HMO | Admitting: *Deleted

## 2021-05-25 VITALS — BP 112/58 | HR 71 | Ht 64.5 in | Wt 237.0 lb

## 2021-05-25 VITALS — BP 134/68 | HR 70 | Wt 235.8 lb

## 2021-05-25 DIAGNOSIS — N1832 Chronic kidney disease, stage 3b: Secondary | ICD-10-CM

## 2021-05-25 DIAGNOSIS — D72829 Elevated white blood cell count, unspecified: Secondary | ICD-10-CM | POA: Diagnosis not present

## 2021-05-25 DIAGNOSIS — Z48812 Encounter for surgical aftercare following surgery on the circulatory system: Secondary | ICD-10-CM | POA: Diagnosis not present

## 2021-05-25 DIAGNOSIS — Z7902 Long term (current) use of antithrombotics/antiplatelets: Secondary | ICD-10-CM | POA: Diagnosis not present

## 2021-05-25 DIAGNOSIS — Z89412 Acquired absence of left great toe: Secondary | ICD-10-CM | POA: Diagnosis not present

## 2021-05-25 DIAGNOSIS — Z89422 Acquired absence of other left toe(s): Secondary | ICD-10-CM | POA: Diagnosis not present

## 2021-05-25 DIAGNOSIS — D649 Anemia, unspecified: Secondary | ICD-10-CM | POA: Diagnosis not present

## 2021-05-25 DIAGNOSIS — I5032 Chronic diastolic (congestive) heart failure: Secondary | ICD-10-CM | POA: Diagnosis not present

## 2021-05-25 DIAGNOSIS — Z955 Presence of coronary angioplasty implant and graft: Secondary | ICD-10-CM | POA: Diagnosis not present

## 2021-05-25 DIAGNOSIS — I739 Peripheral vascular disease, unspecified: Secondary | ICD-10-CM | POA: Insufficient documentation

## 2021-05-25 DIAGNOSIS — I252 Old myocardial infarction: Secondary | ICD-10-CM | POA: Insufficient documentation

## 2021-05-25 DIAGNOSIS — Z9889 Other specified postprocedural states: Secondary | ICD-10-CM | POA: Diagnosis not present

## 2021-05-25 DIAGNOSIS — Z79899 Other long term (current) drug therapy: Secondary | ICD-10-CM | POA: Insufficient documentation

## 2021-05-25 DIAGNOSIS — Z9989 Dependence on other enabling machines and devices: Secondary | ICD-10-CM | POA: Insufficient documentation

## 2021-05-25 DIAGNOSIS — I340 Nonrheumatic mitral (valve) insufficiency: Secondary | ICD-10-CM

## 2021-05-25 DIAGNOSIS — N179 Acute kidney failure, unspecified: Secondary | ICD-10-CM | POA: Insufficient documentation

## 2021-05-25 DIAGNOSIS — Z7982 Long term (current) use of aspirin: Secondary | ICD-10-CM | POA: Insufficient documentation

## 2021-05-25 DIAGNOSIS — E1122 Type 2 diabetes mellitus with diabetic chronic kidney disease: Secondary | ICD-10-CM | POA: Insufficient documentation

## 2021-05-25 DIAGNOSIS — Z006 Encounter for examination for normal comparison and control in clinical research program: Secondary | ICD-10-CM

## 2021-05-25 DIAGNOSIS — R002 Palpitations: Secondary | ICD-10-CM | POA: Diagnosis not present

## 2021-05-25 DIAGNOSIS — N2581 Secondary hyperparathyroidism of renal origin: Secondary | ICD-10-CM

## 2021-05-25 DIAGNOSIS — I2510 Atherosclerotic heart disease of native coronary artery without angina pectoris: Secondary | ICD-10-CM

## 2021-05-25 DIAGNOSIS — I480 Paroxysmal atrial fibrillation: Secondary | ICD-10-CM | POA: Diagnosis not present

## 2021-05-25 DIAGNOSIS — I130 Hypertensive heart and chronic kidney disease with heart failure and stage 1 through stage 4 chronic kidney disease, or unspecified chronic kidney disease: Secondary | ICD-10-CM | POA: Insufficient documentation

## 2021-05-25 DIAGNOSIS — I5033 Acute on chronic diastolic (congestive) heart failure: Secondary | ICD-10-CM

## 2021-05-25 DIAGNOSIS — E1142 Type 2 diabetes mellitus with diabetic polyneuropathy: Secondary | ICD-10-CM

## 2021-05-25 DIAGNOSIS — Z95818 Presence of other cardiac implants and grafts: Secondary | ICD-10-CM

## 2021-05-25 DIAGNOSIS — G4733 Obstructive sleep apnea (adult) (pediatric): Secondary | ICD-10-CM | POA: Insufficient documentation

## 2021-05-25 DIAGNOSIS — E1022 Type 1 diabetes mellitus with diabetic chronic kidney disease: Secondary | ICD-10-CM

## 2021-05-25 DIAGNOSIS — E785 Hyperlipidemia, unspecified: Secondary | ICD-10-CM | POA: Diagnosis not present

## 2021-05-25 DIAGNOSIS — I4819 Other persistent atrial fibrillation: Secondary | ICD-10-CM

## 2021-05-25 MED ORDER — AMOXICILLIN 500 MG PO TABS: ORAL_TABLET | ORAL | 6 refills | Status: DC

## 2021-05-25 MED ORDER — NITROGLYCERIN 0.4 MG SL SUBL: 0.4000 mg | SUBLINGUAL_TABLET | SUBLINGUAL | 3 refills | Status: DC | PRN

## 2021-05-25 NOTE — Patient Instructions (Addendum)
INCREASE Torsemide to 60 mg twice a daily for 2 days  Your physician recommends that you schedule a follow-up appointment in: 3 weeks  in the Advanced Practitioners (PA/NP) Clinic    Do the following things EVERYDAY: Weigh yourself in the morning before breakfast. Write it down and keep it in a log. Take your medicines as prescribed Eat low salt foods--Limit salt (sodium) to 2000 mg per day.  Stay as active as you can everyday Limit all fluids for the day to less than 2 liters  At the [facility], you and your health needs are our priority. As part of our continuing mission to provide you with exceptional heart care, we have created designated Provider Care Teams. These Care Teams include your primary Cardiologist (physician) and Advanced Practice Providers (APPs- Physician Assistants and Nurse Practitioners) who all work together to provide you with the care you need, when you need it.   You may see any of the following providers on your designated Care Team at your next follow up: Dr [name] Dr Haynes Kerns, NP Lyda Jester, [place] [facility] [place], [place] Audry Riles, PharmD   Please be sure to bring in all your medications bottles to every appointment.   If you have any questions or concerns before your next appointment please send [name] a message through [place] or call our office at [phone].    TO LEAVE A MESSAGE FOR THE NURSE SELECT OPTION 2, PLEASE LEAVE A MESSAGE INCLUDING: YOUR NAME DATE OF BIRTH CALL BACK NUMBER REASON FOR CALL**this is important as we prioritize the call backs  YOU WILL RECEIVE A CALL BACK THE SAME DAY AS LONG AS YOU CALL BEFORE 4:00 PM

## 2021-05-25 NOTE — Progress Notes (Signed)
Chronic Care Management Pharmacy Note  05/25/2021 Name:  Dana [name] MRN:  [id number] DOB:  September 07, 1960   Plan Updates:  Patient is very content with current state of health  Subjective: Dana [name] is an 60 y.o. year old female who is a primary patient of Cox, Kirsten, MD.  The CCM team was consulted for assistance with disease management and care coordination needs.    Engaged with patient by telephone for follow up visit in response to provider referral for pharmacy case management and/or care coordination services.   Consent to Services:  The patient was given the following information about Chronic Care Management services today, agreed to services, and gave verbal consent: 1. CCM service includes personalized support from designated clinical staff supervised by the primary care provider, including individualized plan of care and coordination with other care providers 2. 24/7 contact phone numbers for assistance for urgent and routine care needs. 3. Service will only be billed when office clinical staff spend 20 minutes or more in a month to coordinate care. 4. Only one practitioner may furnish and bill the service in a calendar month. 5.The patient may stop CCM services at any time (effective at the end of the month) by phone call to the office staff. 6. The patient will be responsible for cost sharing (co-pay) of up to 20% of the service fee (after annual deductible is met). Patient agreed to services and consent obtained.  Patient Care Team: [name], MD as PCP - General (Family Medicine) Bensimhon, Shaune Pascal, MD as PCP - Cardiology (Cardiology) Bensimhon, Shaune Pascal, MD as PCP - Advanced Heart Failure (Cardiology) Kennith Gain, MD as Consulting Physician (Allergy) Bensimhon, Shaune Pascal, MD as Consulting Physician (Cardiology) Scherrie November, MD as Referring Physician (Gastroenterology) Adegoroye, Wynona Luna, MD as Consulting Physician (Nephrology) Alanda Slim, Neena Rhymes, MD as Consulting Physician (Ophthalmology) Katheran James., MD as Consulting Physician (Endocrinology) Sharyn Dross., DPM as Consulting Physician (Podiatry) Burnice Logan, [facility] (Inactive) as Pharmacist (Pharmacist) Hennie Duos, MD as Consulting Physician (Rheumatology) Biagio Quint, MD as Referring Physician (Nephrology) Biagio Quint, MD as Referring Physician (Nephrology) Lane Hacker, [facility] as Pharmacist (Pharmacist)  Recent office visits: 10/17/2020 - LDL is elevated.  109.  Goal should be less than 70 if not less than 55.  Please discussed with patient.  I also thought she might have been on Repatha or Praluent through the cardiologist.Kidney dysfunction is stable. Sugar is 54.Anemia is stable.  This is multifactorial. Labs are concerning for B12 deficiency.  I would recommend over-the-counter B12 sublingual 2500 mg once daily.  PTH is elevated which is secondary hyperparathyroidism secondary to kidney dysfunction 09/26/2020 - pursue catherization as kidneys improve. Keep appt with nephroloty. Recent consult visits: 10/25/2020 - cardiology - continue torsemide daily. Continue CPAP nightly.  10/19/2020 - Cardiology - good response to Repatha. Check LP(a) with next lipid panel. No medication changes.  10/13/2020 - nephrology - consider resuming RAAS blocker once creatinine stabilizes for protein in urine.  Hospital visits:   09/29/2020 - ED to hospital admission - ACS and AKI.   Objective:  Lab Results  Component Value Date   CREATININE 2.46 (H) 05/19/2021   BUN 68 (H) 05/19/2021   GFRNONAA 22 (L) 05/19/2021   GFRAA 43 (L) 06/28/2020   NA 133 (L) 05/19/2021   K 4.6 05/19/2021   CALCIUM 8.4 (L) 05/19/2021   CO2 23 05/19/2021   GLUCOSE 215 (H) 05/19/2021    Lab Results  Component Value Date/Time   HGBA1C 6.6 (H) 04/25/2021 12:20 AM   HGBA1C 6.8 (H) 09/30/2020 02:41 AM    Last diabetic Eye exam:  Lab Results  Component Value Date/Time    HMDIABEYEEXA Retinopathy (A) 06/06/2020 12:00 AM    Last diabetic Foot exam: No results found for: HMDIABFOOTEX   Lab Results  Component Value Date   CHOL 175 10/17/2020   HDL 43 10/17/2020   LDLCALC 109 (H) 10/17/2020   TRIG 130 10/17/2020   CHOLHDL 4.1 10/17/2020    Hepatic Function Latest Ref Rng & Units 05/16/2021 05/09/2021 05/08/2021  Total Protein 6.5 - 8.1 g/dL 7.0 - 7.8  Albumin 3.5 - 5.0 g/dL 3.4(L) 4.1 4.1  AST 15 - 41 U/L 19 - 14  ALT 0 - 44 U/L 15 - 11  Alk Phosphatase 38 - 126 U/L 87 - 106  Total Bilirubin 0.3 - 1.2 mg/dL 0.8 - 0.4  Bilirubin, Direct 0.0 - 0.2 mg/dL - - -    Lab Results  Component Value Date/Time   TSH 4.014 04/24/2021 12:50 PM   TSH 3.590 03/23/2020 11:01 AM    CBC Latest Ref Rng & Units 05/19/2021 05/16/2021 05/08/2021  WBC 4.0 - 10.5 K/uL 14.0(H) 10.8(H) 10.8  Hemoglobin 12.0 - 15.0 g/dL 7.5(L) 7.9(L) 9.0(L)  Hematocrit 36.0 - 46.0 % 23.9(L) 25.4(L) 28.3(L)  Platelets 150 - 400 K/uL 246 277 313    Lab Results  Component Value Date/Time   VD25OH 26.0 (L) 02/28/2021 02:09 PM    Clinical ASCVD: Yes  The ASCVD Risk score (Arnett DK, et al., 2019) failed to calculate for the following reasons:   The patient has a prior MI or stroke diagnosis    Depression screen [facility] 2/9 05/09/2021 02/13/2021 02/13/2021  Decreased Interest 0 0 0  Down, Depressed, Hopeless 0 0 0  PHQ - 2 Score 0 0 0  Altered sleeping - 2 -  Tired, decreased energy - 2 -  Change in appetite - 0 -  Feeling bad or failure about yourself  - 0 -  Trouble concentrating - 1 -  Moving slowly or fidgety/restless - 0 -  Suicidal thoughts - 0 -  PHQ-9 Score - 5 -  Difficult doing work/chores - Somewhat difficult -      Social History   Tobacco Use  Smoking Status Former  Smokeless Tobacco Never   BP Readings from Last 3 Encounters:  05/19/21 (!) 108/49  05/16/21 (!) 126/53  05/09/21 (!) 134/58   Pulse Readings from Last 3 Encounters:  05/19/21 88  05/16/21 80   05/09/21 82   Wt Readings from Last 3 Encounters:  05/18/21 231 lb 8 oz (105 kg)  05/16/21 231 lb 8 oz (105 kg)  05/09/21 233 lb (105.7 kg)   BMI Readings from Last 3 Encounters:  05/18/21 39.12 kg/m  05/16/21 39.12 kg/m  05/09/21 39.38 kg/m    Assessment/Interventions: Review of patient past medical history, allergies, medications, health status, including review of consultants reports, laboratory and other test data, was performed as part of comprehensive evaluation and provision of chronic care management services.   SDOH:  (Social Determinants of Health) assessments and interventions performed: Yes  SDOH Screenings   Alcohol Screen: Low Risk    Last Alcohol Screening Score (AUDIT): 0  Depression (PHQ2-9): Low Risk    PHQ-2 Score: 0  Financial Resource Strain: Low Risk    Difficulty of Paying Living Expenses: Not very hard  Food Insecurity: No Food Insecurity   Worried About  Running Out of Food in the Last Year: Never true   Ran Out of Food in the Last Year: Never true  Housing: [place] Risk Score: 0  Physical Activity: Not on file  Social Connections: Not on file  Stress: Not on file  Tobacco Use: Medium Risk   Smoking Tobacco Use: Former   Smokeless Tobacco Use: Never   Passive Exposure: Not on file  Transportation Needs: No Transportation Needs   Lack of Transportation (Medical): No   Lack of Transportation (Non-Medical): No    CCM Care Plan  Allergies  Allergen Reactions   Naproxen Hives and Rash   Celecoxib Other (See Comments)    Stomach pain   Aspirin Other (See Comments)    CAN NOT TAKE DUE TO ULCERS (documented previously) but takes every day currently (as of 09/29/20) without reported issues Can take coated aspirin   Glucosamine Hives, Swelling and Other (See Comments)    Angioedema    Iron     IV iron transfusion - hives - Feb 2022 hospitalization   Metoclopramide Other (See Comments)    Facial twitching and stuttering    Metolazone Other (See Comments)    Acute renal failure   Shellfish-Derived Products Hives, Swelling and Other (See Comments)    Angioedema    Tramadol Nausea And Vomiting         Medications Reviewed Today     Reviewed by Erick Colace, CRNA (Certified Registered Nurse Anesthetist) on 05/18/21 at [facility] List Status: Complete   Medication Order Taking? Sig Documenting Provider Last Dose Status Informant  allopurinol (ZYLOPRIM) 100 MG tablet [id number] Yes Take 1 tablet (100 mg total) by mouth daily. [name], MD 05/18/2021 0345 Active   aspirin EC 81 MG EC tablet [id number] Yes Take 1 tablet (81 mg total) by mouth daily. Swallow whole. Ledora Bottcher, [place] 05/18/2021 0345 Active   BD PEN NEEDLE NANO 2ND GEN 32G X 4 MM MISC [id number]  4 (four) times daily. [provider]  Active   carvedilol (COREG) 12.5 MG tablet [id number] Yes Take 1 tablet (12.5 mg total) by mouth 2 (two) times daily. Allena Katz [place], [place] 05/18/2021 0345 Active   Cholecalciferol (VITAMIN D) 50 MCG (2000 UT) tablet [id number] Yes Take 2,000 Units by mouth daily. Take one daily [provider] 05/17/2021 Active   clopidogrel (PLAVIX) 75 MG tablet [id number] Yes Take 1 tablet (75 mg total) by mouth at bedtime. Allena Katz [place], [place] 05/17/2021 Active   Cyanocobalamin 3000 MCG CAPS [id number] Yes Take 2 capsules by mouth daily. Patient purchased 3000 mcg gummies and taking 2 gummies daily [provider] 05/17/2021 Active   EPINEPHrine 0.3 mg/0.3 mL IJ SOAJ injection [id number] No Use as directed for life-threatening allergic reaction. Kennith Gain, MD Unknown Active   Evolocumab (Waldo) 768 MG/ML Darden Palmer [id number] Yes Inject 1 Dose into the skin every 14 (fourteen) days. Pixie Casino, MD 05/15/2021 Active   ezetimibe (ZETIA) 10 MG tablet [id number] Yes Take 1 tablet (10 mg total) by mouth daily. Pixie Casino, MD 05/17/2021 Active   famotidine (PEPCID) 20 MG tablet  [id number] Yes Take 1 tablet (20 mg total) by mouth daily. Kennith Gain, MD 05/18/2021 0345 Active   ferrous sulfate 325 (65 FE) MG tablet [id number] Yes Take 325 mg by mouth 2 (two) times daily with a meal. [provider] 05/18/2021 0345 Active   hydrALAZINE (APRESOLINE) 25 MG tablet [id number] Yes Take 1  tablet (25 mg total) by mouth 3 (three) times daily. Ledora Bottcher, PA 05/18/2021 0345 Active   HYDROcodone-acetaminophen (NORCO) 7.5-325 MG tablet [id number] Yes Take 1 tablet by mouth 3 (three) times daily as needed for moderate pain. [name], MD 05/17/2021 Active   insulin aspart (NOVOLOG) 100 UNIT/ML injection [id number] Yes Inject 2-10 Units into the skin 3 (three) times daily before meals. [provider] 05/17/2021 Active   isosorbide mononitrate (IMDUR) 30 MG 24 hr tablet [id number] Yes Take 1 tablet (30 mg total) by mouth daily. Ledora Bottcher, PA 05/18/2021 0345 Active   lactulose (CHRONULAC) 10 GM/15ML solution [id number] Yes Take 15 mLs (10 g total) by mouth daily as needed for mild constipation. Lillard Anes, MD 05/15/2021 Active   latanoprost (XALATAN) 0.005 % ophthalmic solution [id number] Yes Place 1 drop into both eyes at bedtime. [provider] 05/17/2021 Active   LEVEMIR FLEXTOUCH 100 UNIT/ML FlexPen [id number] Yes Inject 50 Units into the skin daily. [provider] 05/18/2021 0345 Active            Med Note Wilmon Pali, MELISSA R   Wed May 10, 2021  4:34 PM)    LORazepam (ATIVAN) 0.5 MG tablet [id number] Yes Take 1 tablet (0.5 mg total) by mouth daily as needed for anxiety. [name], MD 05/17/2021 Active   magnesium oxide (MAG-OX) 400 MG tablet [id number] Yes Take 2 tablets (800 mg total) by mouth daily. Take 2 (400 mg) daily=800 mg Shirley Friar, PA-C 05/18/2021 0345 Active   nitrofurantoin, macrocrystal-monohydrate, (MACROBID) 100 MG capsule [id number] Yes Take 1 capsule (100 mg total) by mouth 2 (two) times daily.  Rip Harbour, NP 05/17/2021 Active            Med Note Wilmon Pali, MELISSA R   Wed May 10, 2021  4:37 PM) 7 day regimen   nitroGLYCERIN (NITROSTAT) 0.4 MG SL tablet [id number] Yes Place 1 tablet (0.4 mg total) under the tongue every 5 (five) minutes as needed for chest pain. Allena Katz [place], [place] 05/15/2021 Active            Med Note (JEFFRIES, Sharlot Gowda   Tue Oct 25, 2020  9:05 AM)    Omega-3 Fatty Acids (FISH OIL PO) [id number] Yes Take 1 capsule by mouth daily. [provider] 05/17/2021 Active   OXYGEN [id number] Yes Inhale 2 L into the lungs at bedtime. [provider]  Active   pantoprazole (PROTONIX) 40 MG tablet [id number] Yes Take 1 tablet (40 mg total) by mouth daily. Marge Duncans, PA-C 05/18/2021 0345 Active   potassium chloride SA (KLOR-CON) 20 MEQ tablet [id number] Yes Take 1 tablet (20 mEq total) by mouth daily. Ledora Bottcher, [place] 05/18/2021 0345 Active   Probiotic CAPS [id number] Yes Take 1 capsule by mouth daily. [provider] 05/17/2021 Active   promethazine (PHENERGAN) 25 MG tablet [id number] Yes Take 1 tablet (25 mg total) by mouth every 6 (six) hours as needed for nausea or vomiting. [name], MD 05/17/2021 Active   torsemide (DEMADEX) 20 MG tablet [id number] Yes Take 3 tablets (60 mg total) by mouth daily. Joette Catching, [place] 05/17/2021 Active   Med List Note Modena Nunnery, [place], [place] 03/04/18 1416): CPAP            Patient Active Problem List   Diagnosis Date Noted   Severe mitral regurgitation 05/18/2021   S/P mitral valve clip implantation 05/18/2021   Acute on chronic diastolic heart failure ([place]) 04/24/2021   PEA (Pulseless electrical  activity) ([place]) 04/19/2021   Anemia due to stage 4 chronic kidney disease ([place]) 04/04/2021   Cognitive communication deficit 02/13/2021   History of cardiac arrest 02/06/2021   Right hand paresthesia 01/25/2021   Bilateral lower extremity edema 01/23/2021   Anoxic brain injury ([place]) 01/20/2021    Impaired mobility and ADLs 01/20/2021   Left-sided weakness 01/20/2021   Memory deficits 01/20/2021   Statin myopathy 01/03/2021   Secondary hyperparathyroidism of renal origin ([place]) 11/18/2020   Vitamin D deficiency 11/18/2020   Proteinuria 10/13/2020   Stage 3b chronic kidney disease ([place]) 10/13/2020   Chest pain 09/29/2020   ACS (acute coronary syndrome) ([place])    Contrast dye induced nephropathy, possible 09/03/2020   OSA on CPAP 09/03/2020   Need for COVID-19 vaccine 06/28/2020   Medicare annual wellness visit, subsequent 06/13/2020   Chronic respiratory failure with hypoxia ([place]) 03/23/2020   Chronic gout without tophus 03/23/2020   Pain in both hands 55/73/2202   Uncomplicated opioid dependence ([place]) 03/23/2020   Depression, major, recurrent, mild ([place]) 12/21/2019   Hypomagnesemia 12/21/2019   Diarrhea 12/21/2019   Lumbar back pain 10/05/2019   Diabetic nephropathy associated with type 2 diabetes mellitus ([place]) 10/05/2019   Hypertensive heart and renal disease with congestive heart failure ([place]) 09/18/2019   Demand ischemia (HCC)    Diastolic heart failure ([place]) 11/07/2017   Morbid obesity with BMI of 45.0-49.9, adult ([place]) 11/07/2017   Coronary artery disease involving native coronary artery of native heart without angina pectoris 12/25/2016   Hyperlipidemia LDL goal <70 12/14/2016   Anemia 03/23/2016   Chronic pain syndrome 03/23/2016   COPD (chronic obstructive pulmonary disease) ([place]) 03/23/2016   GERD without esophagitis 03/23/2016   On home oxygen therapy 03/23/2016   S/P ablation of atrial fibrillation 10/04/2015   Diabetic polyneuropathy associated with type 2 diabetes mellitus ([place]) 04/02/2014    Immunization History  Administered Date(s) Administered   Influenza Inj Mdck Quad Pf 03/23/2020, 04/04/2021   Influenza, Quadrivalent, Recombinant, Inj, Pf 05/21/2018   Influenza,inj,Quad PF,6+ Mos 03/28/2015   Influenza-Unspecified 04/15/2014, 05/16/2016,  03/12/2019   Moderna Covid-19 Vaccine Bivalent Booster 74yr & up 05/09/2021   Moderna SARS-COV2 Booster Vaccination 06/28/2020   Pneumococcal Conjugate-13 05/16/2014, 06/14/2014   Pneumococcal Polysaccharide-23 05/18/2018    Conditions to be addressed/monitored:  Hyperlipidemia, Diabetes, Heart Failure and Coronary Artery Disease  Care Plan : CMinturn Updates made by KLane Hacker RPH since 05/25/2021 12:00 AM     Problem: chf, hld, dm   Priority: High  Onset Date: 11/17/2020     Long-Range Goal: Disease State Management   Start Date: 11/17/2020  Expected End Date: 11/17/2021  Recent Progress: On track  Priority: High  Note:     Current Barriers:  Unable to maintain control of diabetes  Pharmacist Clinical Goal(s):  Patient will maintain control of diabetes as evidenced by a1c and avoiding low blood sugar readings  through collaboration with PharmD and provider.   Interventions: 1:1 collaboration with CRochel Brome MD regarding development and update of comprehensive plan of care as evidenced by provider attestation and co-signature Inter-disciplinary care team collaboration (see longitudinal plan of care) Comprehensive medication review performed; medication list updated in electronic medical record  Hyperlipidemia: (LDL goal < 55) The ASCVD Risk score (Arnett DK, et al., 2019) failed to calculate for the following reasons:   The patient has a prior MI or stroke diagnosis Lab Results  Component Value Date   CHOL 175 10/17/2020   CHOL 101  09/30/2020   CHOL 121 06/28/2020   Lab Results  Component Value Date   HDL 43 10/17/2020   HDL 42 09/30/2020   HDL 51 06/28/2020   Lab Results  Component Value Date   LDLCALC 109 (H) 10/17/2020   LDLCALC 44 09/30/2020   LDLCALC 51 06/28/2020   Lab Results  Component Value Date   TRIG 130 10/17/2020   TRIG 77 09/30/2020   TRIG 100 06/28/2020   Lab Results  Component Value Date   CHOLHDL 4.1 10/17/2020    CHOLHDL 2.4 09/30/2020   CHOLHDL 2.4 06/28/2020  No results found for: LDLDIRECT -Not ideally controlled -Current treatment: Repatha sureclick every 2 weeks Ezetimibe 47m -Medications previously tried: pravastatin  -Current dietary patterns: working on hMirantwith dietician through insurance -Current exercise habits: cardiac rehab. [facility]  -Educated on Cholesterol goals;  Importance of limiting foods high in cholesterol; Exercise goal of 150 minutes per week; -Counseled on diet and exercise extensively Recommended to continue current medication  Diabetes (A1c goal <7%) Lab Results  Component Value Date   HGBA1C 6.6 (H) 04/25/2021   HGBA1C 6.8 (H) 09/30/2020   HGBA1C 6.1 (H) 08/30/2020   Lab Results  Component Value Date   LDLCALC 109 (H) 10/17/2020   CREATININE 2.46 (H) 05/19/2021   Lab Results  Component Value Date   NA 133 (L) 05/19/2021   K 4.6 05/19/2021   CREATININE 2.46 (H) 05/19/2021   EGFR 22 (L) 05/09/2021   GFRNONAA 22 (L) 05/19/2021   GLUCOSE 215 (H) 05/19/2021   Lab Results  Component Value Date   WBC 14.0 (H) 05/19/2021   HGB 7.5 (L) 05/19/2021   HCT 23.9 (L) 05/19/2021   MCV 85.7 05/19/2021   PLT 246 05/19/2021  -Controlled -Current medications: Levemir 50 units daily  Novolog 2-10 units tid before meals  -Medications previously tried:  farxiga -Current home glucose readings fasting glucose:  May 2022: 99-105  November 2022: 05/25/21: 11 post prandial glucose: well controlled only low is when she gets busy and eats too late after her meal-time insulin -Reports hypoglycemic/hyperglycemic symptoms - 2 times in the past few weeks when she got busy and didn't eat as quickly -Current meal patterns:  breakfast:  Watching diet and improving choices for heart healthy, lower sodium and lower blood sugar lunch:  not overly hungry at lunch time but does try to eat  dinner: avoiding  fried foods, more fish (2-3 times weekly), baked chicken, utilizing portion control, not eating pizza since heart attack snacks: 1/2 peanut butter sandwich, has cut back  drinks:  has cut out diet coke -Current exercise: cardiac rehab -Educated on A1c and blood sugar goals; Exercise goal of 150 minutes per week; Prevention and management of hypoglycemic episodes; Carbohydrate counting and/or plate method -Counseled to check feet daily and get yearly eye exams -Counseled on diet and exercise extensively Recommended to continue current medication. Patient started 3000 mcg daily of vitamin b-12 2 gummies daily. Working with dietician and has been losing some weight.   Heart Failure (Goal: manage symptoms and prevent exacerbations) BP Readings from Last 3 Encounters:  05/19/21 (!) 108/49  05/16/21 (!) 126/53  05/09/21 (!) 134/58  -Controlled -Last ejection fraction: 55-60% (Date: 09/30/2020) -HF type: Diastolic -NYHA Class: II (slight limitation of activity) -Current treatment: Torsemide 20 mg 2 tablets daily  Carvedilol 12.5 mg bid  Hydralazine 25 mg tid  Isosorbide mn 30 mg daily  -Weight: (Weighs  herself everyday) Normal: 232 + 2 pounds 05/25/21: 232 BP: (Tests daily) 05/25/21: 132/57 -Medications previously tried:  Metolazone, spironolactone,  -Current dietary habits:  watching sodium  -Current exercise habits:  cardiac rehab currently  -Educated on Benefits of medications for managing symptoms and prolonging life Importance of blood pressure control -Counseled on diet and exercise extensively Recommended to continue current medication. Wearing support stockings every day.    Patient Goals/Self-Care Activities Patient will:  - take medications as prescribed focus on medication adherence by using pill box  check glucose 3-4 times daily, document, and provide at future appointments target a minimum of 150 minutes of moderate intensity exercise weekly engage in dietary  modifications by limiting carbohydrates, sodium and fatty foods  Follow Up Plan: Telephone follow up appointment with care management team member scheduled for: Jan 2023      Medication Assistance: Patient has grant to cover cost of [place].   Patient's preferred pharmacy is:  [facility] [address], [place] [address] [place] [address] Phone: [phone] Fax: [phone]  [facility] ([place] - [place], [place] [facility] [address] [place] [address] [place] [address] Phone: [phone] Fax: [phone]  Uses pill box? Yes Pt endorses good compliance  We discussed: Current pharmacy is preferred with insurance plan and patient is satisfied with pharmacy services Patient decided to: Continue current medication management strategy  Care Plan and Follow Up Patient Decision:  Patient agrees to Care Plan and Follow-up.  Plan: Telephone follow up appointment with care management team member scheduled for:  Jan 2023

## 2021-05-25 NOTE — Patient Instructions (Signed)
Medication Instructions:  Start Amoxicillin 500 mg, take 4 tablets by mouth 1 hour prior to dental procedures and cleanings   *If you need a refill on your cardiac medications before your next appointment, please call your pharmacy*   Lab Work: None ordered   If you have labs (blood work) drawn today and your tests are completely normal, you will receive your results only by: [place] (if you have MyChart) OR A paper copy in the mail If you have any lab test that is abnormal or we need to change your treatment, we will call you to review the results.   Testing/Procedures: None ordered    Follow-Up: Follow up as scheduled    Other Instructions None

## 2021-05-25 NOTE — Research (Addendum)
SITE #  [id number]         SUBJECT #   [id number]       SUBJECT Initials:  WGS  30- DAY FOLLOW UP VISIT  Discharge Date / time::  _15 / OCT/ _2022___   at _13:_24_      []   Time unknown        DD/MMM/YYYY        HH;MM  Date of Follow-up Visit: _10 / _NOV_/ 2022_   at _14:00__      []   Time unknown        DD/MMM/YYYY        HH;MM  Patient was seen in conjunction with her HF clinic appointment today with Marlyce Huge PA-C  Estimated pounds of fluid overload:  __2___ lbs  Is Subject experiencing Shortness of Breath?  [x]  Yes   []   No  Dyspnea VAS: (10 being the worst)   []  1 []  2 []  3 [x]  4 []  5 []  6 []  7 [] 8 []  9 []  10  Is Subject experiencing Orthopnea?  [x]  Yes    []  No  Is there evidence of Rales?  []  Yes  [x]   No    If Yes, Characterize:   []  Rhonchi     []  Crackles     []  Wheezing     []  Stridor Ascites? []   Yes  [x]   No  Leg Edema?  [x]  Yes    []  No         If yes, grade of edema:  []  1+  [x]  2+  []  3+  []  4+  Sacral Edema?  []  Yes   [x]  No        If yes, grade of edema:    []  1+  []  2+  []  3+  []  4+  Jugular Vein Distention (JVD)?:  [x]  Yes  []   No        If yes, provide measurement:   __2__ cm  NYHA Classification:  []  I   []  II  [x]  III  []   IV  []   N/A  Has subject expressed presence of thirst?  []  Yes  [x]   No       If Yes, rate of thirst level (10 being the worst): []  1 []  2 []  3 []  4 []  5 []  6 []  7 [] 8 []  9 []  10    Was there any change in subjects's medications?  []  YES  [x]   NO     Update:__  Did the subject experience any NEW adverse events?  []  YES  [x]   NO     Update:____  Was Auditory Testing performed at this visit?  []   YES  [x]   NO      If No, provide reason: ____ Not required assessment for this 30 day visit   COMMENTS: patient's Cardioimems reading was 12 today, goal is 10. She is anxious to start her cardiac rehab program as it has helped her in the past. Research had the following bloodwork drawn as per protocol:  CBC, CMP and Magnesium  ([facility])   Form is based on Pages 36 of FASTR Trial  eCRFs    Protocol [id number]    Current Outpatient Medications:    allopurinol (ZYLOPRIM) 100 MG tablet, Take 1 tablet (100 mg total) by mouth daily., Disp: 90 tablet, Rfl: 0   amoxicillin (AMOXIL) 500 MG tablet, Take 4 tablets by mouth 1 hour prior to dental procedures and cleanings (Patient not  taking: Reported on 05/25/2021), Disp: 12 tablet, Rfl: 6   aspirin EC 81 MG EC tablet, Take 1 tablet (81 mg total) by mouth daily. Swallow whole., Disp: 90 tablet, Rfl: 3   BD PEN NEEDLE NANO 2ND GEN 32G X 4 MM MISC, 4 (four) times daily., Disp: , Rfl:    carvedilol (COREG) 12.5 MG tablet, Take 1 tablet (12.5 mg total) by mouth 2 (two) times daily., Disp: 60 tablet, Rfl: 11   Cholecalciferol (VITAMIN D) 50 MCG (2000 UT) tablet, Take 2,000 Units by mouth daily. Take one daily, Disp: , Rfl:    clopidogrel (PLAVIX) 75 MG tablet, Take 1 tablet (75 mg total) by mouth at bedtime., Disp: 90 tablet, Rfl: 3   Cyanocobalamin 3000 MCG CAPS, Take 2 capsules by mouth daily. Patient purchased 3000 mcg gummies and taking 2 gummies daily, Disp: , Rfl:    EPINEPHrine 0.3 mg/0.3 mL IJ SOAJ injection, Use as directed for life-threatening allergic reaction., Disp: 4 Device, Rfl: 3   Evolocumab (REPATHA SURECLICK) 277 MG/ML SOAJ, Inject 1 Dose into the skin every 14 (fourteen) days., Disp: 6 mL, Rfl: 3   ezetimibe (ZETIA) 10 MG tablet, Take 1 tablet (10 mg total) by mouth daily., Disp: 90 tablet, Rfl: 2   ferrous sulfate 325 (65 FE) MG tablet, Take 325 mg by mouth 2 (two) times daily with a meal., Disp: , Rfl:    hydrALAZINE (APRESOLINE) 25 MG tablet, Take 1 tablet (25 mg total) by mouth 3 (three) times daily., Disp: 270 tablet, Rfl: 0   HYDROcodone-acetaminophen (NORCO) 7.5-325 MG tablet, Take 1 tablet by mouth 3 (three) times daily as needed for moderate pain., Disp: 90 tablet, Rfl: 0   insulin aspart (NOVOLOG) 100 UNIT/ML injection, Inject 2-10 Units into the skin 3  (three) times daily before meals., Disp: , Rfl:    isosorbide mononitrate (IMDUR) 30 MG 24 hr tablet, Take 1 tablet (30 mg total) by mouth daily., Disp: 90 tablet, Rfl: 3   lactulose (CHRONULAC) 10 GM/15ML solution, Take 15 mLs (10 g total) by mouth daily as needed for mild constipation., Disp: 236 mL, Rfl: 0   latanoprost (XALATAN) 0.005 % ophthalmic solution, Place 1 drop into both eyes at bedtime., Disp: , Rfl:    LEVEMIR FLEXTOUCH 100 UNIT/ML FlexPen, Inject 50 Units into the skin daily., Disp: , Rfl:    LORazepam (ATIVAN) 0.5 MG tablet, Take 1 tablet (0.5 mg total) by mouth daily as needed for anxiety., Disp: 30 tablet, Rfl: 2   magnesium oxide (MAG-OX) 400 MG tablet, Take 2 tablets (800 mg total) by mouth daily. Take 2 (400 mg) daily=800 mg, Disp: 60 tablet, Rfl: 1   nitroGLYCERIN (NITROSTAT) 0.4 MG SL tablet, Place 1 tablet (0.4 mg total) under the tongue every 5 (five) minutes as needed for chest pain., Disp: 25 tablet, Rfl: 3   Omega-3 Fatty Acids (FISH OIL PO), Take 1 capsule by mouth daily., Disp: , Rfl:    OXYGEN, Inhale 2 L into the lungs at bedtime., Disp: , Rfl:    pantoprazole (PROTONIX) 40 MG tablet, Take 1 tablet (40 mg total) by mouth daily., Disp: 30 tablet, Rfl: 3   potassium chloride SA (KLOR-CON) 20 MEQ tablet, Take 1 tablet (20 mEq total) by mouth daily., Disp: 90 tablet, Rfl: 3   Probiotic CAPS, Take 1 capsule by mouth daily., Disp: , Rfl:    promethazine (PHENERGAN) 25 MG tablet, Take 1 tablet (25 mg total) by mouth every 6 (six) hours as needed for nausea  or vomiting., Disp: 90 tablet, Rfl: 1   torsemide (DEMADEX) 20 MG tablet, Take 3 tablets (60 mg total) by mouth daily., Disp: 90 tablet, Rfl: 3

## 2021-05-25 NOTE — Progress Notes (Addendum)
Advanced Heart Failure Clinic Note   PCP: CoxElnita Maxwell, MD PCP-Cardiologist: Dana Bickers, MD   Reason for Visit: Hospital f/u acute on chronic diastolic HF  HPI: [name] is 60 y.o. female with history of diastolic heart failure s/p cardiomems 02/2018, CAD, s/p DES RCA and LAD 2018, DMI, htn, hyperlipidemia, PAD, and OSA on CPAP, PAF s/p ablation, PAD s/p L great toe and 4th toe amputation, stage IIIb CKD.   Admitted 03/22 with NSTEMI and a/c CHF. LHC 03/22 showed moderate mid-LAD and mid-RCA disease, severe mid-L-Cx stenosis s/p PCI/DES mid Cx and side branch angioplasty of OM1.   Unfortunately she developed a contrast-induced nephropathy after cath and creatinine increased from 2.2 to a peak of 5.5. Additionally, she developed severe nausea and vomiting, gastroparesis and required IV antiplatelet therapy.  During this time she developed epistaxis and acute blood loss anemia requiring transfusion.  She was switched back to oral DAPT. Diuretics were reintroduced and her creatinine began to improve over the following days. Her leukocytosis persisted but all her infectious workup was negative and this was felt to be reactive.    Per chart review, she was admitted 6/22 to [facility] for DKA, started on insulin gtt, IVF and diuretics held. Developed pulmonary vascular congestion vs PNA and was started on IV abx, and diuretics were resumed. She suffered a PEA arrest 01/12/21 with ROSC after 10-15 minutes of CPR and was transferred to [place]; precipitating factor for PEA arrest thought to be respiratory driven. On arrival, she was still in DKA, AKI on CKD with oliguria and Nephrology was consulted for further management. TTE with EF 60%, min MR/TR. Concern for stroke due to left-sided weakness and MRI confirmed anoxic brain injury, but no definite infarct. She was extubated, SCr returned to baseline of 1.6-1.8 and she was discharged to rehab facility.   Zio placed 8/22 for palpitations, showing mostly  SB/SR no afib, 66 SVT runs, frequent isolated SVE (11.2%).  Admitted from AHF clinic 10/22 for A/C CHF after failing outpatient escalation in diuretic therapy. She was enrolled in FASTR trial and diuresed > 15 lb.  Hospitalization complicated by AKI on CKD, Scr up to 3.13. RHC showed mild to moderate elevating filling pressures with large v-waves in PCWP tracing suggesting severe MR vs severe diastolic dysfunction. TEE showed severe central MR.   Patient underwent transcatheter edge-to-edge repair of mitral valve with placement of 1XT W and 1NT W clip on 05/18/2021. NT W clip was entangled and had to be deployed into lateral part of valve. No additional clip placement could be preformed. Echo 11/04 with EF 50-55% and residual moderate MR. Given 1 dose IV lasix prior to discharge.   Seen in structural clinic for f/u today. Planning for repeat echo and f/u in 4-6 weeks.   Here today for close HF f/u. Reports a little more shortness of breath and increased leg edema the last few days. Weight up 4 lb from clinic visit 10/12. No CP. Taking 60 mg Torsemide daily. Watching salt and fluid intake. Has had intermittent palpitations for some time, have not increased in frequency.   No recurrent nosebleeds. Denies melena. Did have some oozing from groin site after recent procedure. Hgb 9.0 10/24> 7.9  11/01 > 7.5 11/04.      Cardiac Studies: - RHC (10/22):   RA = 8 RV = 59/9 PA = 61/17 (35) PCW = mean 23 (v = 49) -> performed multiple wedges Fick cardiac output/index = PVR = 1.6 WU FA sat =  98% PA sat = 69%, 70% PaPi = 5.5 Assessment: 1. Mild to moderately elevated filling pressures with very large v-waves in PCWP tracing suggestive of severe diastolic dysfunction vs severe MR  - TEE (10/22): EF 55-60%, basal inferior akinesis, severe central MR. - Echo (10/22): EF 65-70%, RV ok, moderate MR - Echo (5/21): EF 60-65%, RV ok, Grade II DD. - Echo (2/22): EF 55-60%, RV ok, Grade II DD, mod MR - Echo  (3/22): EF 55-60%, Grade II DD, normal RV - Echo (6/22): EF 60%, mod MR/TR (per chart review at [place]). - Zio (8/19): no arrhythmias, occasional PAC/PVCs - Zio (9/22): mostly SB/SR no afib, 66 SVT runs, frequent isolated SVE (11.2%).  - LHC (3/22): 3 vessel CAD with moderate mid-LAD and mid-RCA stenoses, and severe mid-LCx stenosis; successful PCI of the mid-circumflex, reducing severe 90% stenosis to 0% with a 2.75x22 mm Resolute Onyx DES, provisional side branch angioplasty of the OM1 with a 2.5 mm balloon through the stent struts  Review of systems complete and found to be negative unless listed in HPI.    Past Medical History:  Diagnosis Date   Acquired absence of left great toe (HCC)    Acquired absence of other left toe(s) ([place])    Anemia    Anxiety    CAD (coronary artery disease)    DES mLAD 04/07/15; DES dRCA & DES pPDA 08/30/16; DES mCX & PTCA OM1 09/29/20   Chronic diastolic (congestive) heart failure (HCC)    Chronic systolic (congestive) heart failure (HCC)    CKD (chronic kidney disease)    Contrast dye induced nephropathy, possible 09/03/2020   Diabetes (HCC)    Diastolic CHF (HCC)    Dyspnea    Gastro-esophageal reflux disease without esophagitis    Hyperlipidemia    Hypertension    Hypertensive heart disease with heart failure (HCC)    Hypoxemia    Idiopathic gout, multiple sites    Localized edema    Low back pain    Mitral regurgitation    Mixed hyperlipidemia    Mixed incontinence    Morbid (severe) obesity due to excess calories (HCC)    Neuromuscular disorder (HCC)    Neuropathy    Non-ST elevation (NSTEMI) myocardial infarction ([place])    08/29/20, 09/29/20   OSA (obstructive sleep apnea) 09/03/2020   Other specified hypothyroidism    PAF (paroxysmal atrial fibrillation) (HCC)    s/p pulmonary vein isolation by cryoablation 10/04/15 Dr. Minna Merritts in [facility]   PEA (Pulseless electrical activity) [facility])    ~ 01/13/21 at [facility] during admission DKA,  volume overload, possible CAP, hypoxia s/p ACLS with ROSC ~ 10-15   Pneumonia    Primary insomnia    Type 2 diabetes mellitus with diabetic nephropathy (HCC)     Current Outpatient Medications  Medication Sig Dispense Refill   allopurinol (ZYLOPRIM) 100 MG tablet Take 1 tablet (100 mg total) by mouth daily. 90 tablet 0   aspirin EC 81 MG EC tablet Take 1 tablet (81 mg total) by mouth daily. Swallow whole. 90 tablet 3   BD PEN NEEDLE NANO 2ND GEN 32G X 4 MM MISC 4 (four) times daily.     carvedilol (COREG) 12.5 MG tablet Take 1 tablet (12.5 mg total) by mouth 2 (two) times daily. 60 tablet 11   Cholecalciferol (VITAMIN D) 50 MCG (2000 UT) tablet Take 2,000 Units by mouth daily. Take one daily     clopidogrel (PLAVIX) 75 MG tablet Take 1 tablet (75  mg total) by mouth at bedtime. 90 tablet 3   Cyanocobalamin 3000 MCG CAPS Take 2 capsules by mouth daily. Patient purchased 3000 mcg gummies and taking 2 gummies daily     EPINEPHrine 0.3 mg/0.3 mL IJ SOAJ injection Use as directed for life-threatening allergic reaction. 4 Device 3   Evolocumab (REPATHA SURECLICK) 401 MG/ML SOAJ Inject 1 Dose into the skin every 14 (fourteen) days. 6 mL 3   ezetimibe (ZETIA) 10 MG tablet Take 1 tablet (10 mg total) by mouth daily. 90 tablet 2   ferrous sulfate 325 (65 FE) MG tablet Take 325 mg by mouth 2 (two) times daily with a meal.     hydrALAZINE (APRESOLINE) 25 MG tablet Take 1 tablet (25 mg total) by mouth 3 (three) times daily. 270 tablet 0   HYDROcodone-acetaminophen (NORCO) 7.5-325 MG tablet Take 1 tablet by mouth 3 (three) times daily as needed for moderate pain. 90 tablet 0   insulin aspart (NOVOLOG) 100 UNIT/ML injection Inject 2-10 Units into the skin 3 (three) times daily before meals.     isosorbide mononitrate (IMDUR) 30 MG 24 hr tablet Take 1 tablet (30 mg total) by mouth daily. 90 tablet 3   lactulose (CHRONULAC) 10 GM/15ML solution Take 15 mLs (10 g total) by mouth daily as needed for mild  constipation. 236 mL 0   latanoprost (XALATAN) 0.005 % ophthalmic solution Place 1 drop into both eyes at bedtime.     LEVEMIR FLEXTOUCH 100 UNIT/ML FlexPen Inject 50 Units into the skin daily.     LORazepam (ATIVAN) 0.5 MG tablet Take 1 tablet (0.5 mg total) by mouth daily as needed for anxiety. 30 tablet 2   magnesium oxide (MAG-OX) 400 MG tablet Take 2 tablets (800 mg total) by mouth daily. Take 2 (400 mg) daily=800 mg 60 tablet 1   nitroGLYCERIN (NITROSTAT) 0.4 MG SL tablet Place 1 tablet (0.4 mg total) under the tongue every 5 (five) minutes as needed for chest pain. 25 tablet 3   Omega-3 Fatty Acids (FISH OIL PO) Take 1 capsule by mouth daily.     OXYGEN Inhale 2 L into the lungs at bedtime.     pantoprazole (PROTONIX) 40 MG tablet Take 1 tablet (40 mg total) by mouth daily. 30 tablet 3   potassium chloride SA (KLOR-CON) 20 MEQ tablet Take 1 tablet (20 mEq total) by mouth daily. 90 tablet 3   Probiotic CAPS Take 1 capsule by mouth daily.     promethazine (PHENERGAN) 25 MG tablet Take 1 tablet (25 mg total) by mouth every 6 (six) hours as needed for nausea or vomiting. 90 tablet 1   torsemide (DEMADEX) 20 MG tablet Take 3 tablets (60 mg total) by mouth daily. 90 tablet 3   amoxicillin (AMOXIL) 500 MG tablet Take 4 tablets by mouth 1 hour prior to dental procedures and cleanings (Patient not taking: Reported on 05/25/2021) 12 tablet 6   No current facility-administered medications for this encounter.   Allergies  Allergen Reactions   Naproxen Hives and Rash   Celecoxib Other (See Comments)    Stomach pain   Aspirin Other (See Comments)    CAN NOT TAKE DUE TO ULCERS (documented previously) but takes every day currently (as of 09/29/20) without reported issues Can take coated aspirin   Glucosamine Hives, Swelling and Other (See Comments)    Angioedema    Iron     IV iron transfusion - hives - Feb 2022 hospitalization   Metoclopramide Other (See Comments)  Facial twitching and  stuttering   Metolazone Other (See Comments)    Acute renal failure   Shellfish-Derived Products Hives, Swelling and Other (See Comments)    Angioedema    Tramadol Nausea And Vomiting        Social History   Socioeconomic History   Marital status: Married    Spouse name: Not on file   Number of children: Not on file   Years of education: Not on file   Highest education level: Not on file  Occupational History   Not on file  Tobacco Use   Smoking status: Former   Smokeless tobacco: Never  Vaping Use   Vaping Use: Never used  Substance and Sexual Activity   Alcohol use: Not Currently   Drug use: Not Currently   Sexual activity: Not on file  Other Topics Concern   Not on file  Social History Narrative   Not on file   Social Determinants of Health   Financial Resource Strain: Low Risk    Difficulty of Paying Living Expenses: Not very hard  Food Insecurity: No Food Insecurity   Worried About Running Out of Food in the Last Year: Never true   Ran Out of Food in the Last Year: Never true  Transportation Needs: No Transportation Needs   Lack of Transportation (Medical): No   Lack of Transportation (Non-Medical): No  Physical Activity: Not on file  Stress: Not on file  Social Connections: Not on file  Intimate Partner Violence: Not on file   Family History  Problem Relation Age of Onset   Hypertension Father    Coronary artery disease Mother    Hypertension Mother    Hyperlipidemia Mother    Diabetes type II Mother    Breast cancer Mother    Lung cancer Mother    Bipolar disorder Other    Depression Other    Schizophrenia Other    BP 134/68   Pulse 70   Wt 107 kg   SpO2 97%   BMI 39.85 kg/m   Wt Readings from Last 3 Encounters:  05/25/21 107 kg  05/25/21 107.5 kg  05/18/21 105 kg   PHYSICAL EXAM: General:  Sitting up in wheelchair, no distress. Husband and daughter present. HEENT: normal Neck: supple. JVD difficult to assess. Carotids 2+ bilat; no  bruits.  Cor: Regular rate and rhythm.  No rubs, gallops, 2/6 holosystolic murmur Lungs: clear Abdomen: soft, nontender, nondistended. No hepatosplenomegaly.  Extremities: no cyanosis, clubbing, rash, 2+ LE edema, compression stockings on Neuro: alert & orientedx3, cranial nerves grossly intact. moves all 4 extremities w/o difficulty. Affect pleasant   CardioMEMs: Goal dPAP: 10  Today's dPAP: 12   ASSESSMENT & PLAN: 1.  Acute on chronic Diastolic Heart Failure - Echo 08/2020 EF 55-60%  - Echo (3/22): EF 55-60%, Grade II DD, normal RV - Echo (6/22): EF 60%, mod MR/TR (per chart review at [place]). - RHC (10/22): mild to moderate elevated filling pressures, large v-waves PCWP tracing - TEE (10/22): EF 55-60%, basal inferior akinesis, severe central MR. - Recent admit 10/22 with a/c CHF, enrolled in FASTR trial and diuresed 15 lbs. Severe MR likely contributing to decompensation.  - NYHA IIIa. Volume appears slightly up. More dyspnea and leg edema. Today's dPAP 12, goal 10. Takes 60 mg Torsemide daily. Will have her take an extra 60 mg X 2 days.  - Continue carvedilol 12.5 mg bid. - Continue hydralazine 25 mg tid + Imdur 30 mg daily. - No SGLT2i with  T1DM. - Continue TED hose   2. CAD - Known CAD with prior stents to mid LAD and pRCA.  - NSTEMI (2/22). Cath deferred due to AKI. Treated medically.  - NSTEMI (7/22)-->S/p PCI/DES to mid Cx, balloon angioplasty to OM1  - Denies CP - Ideally continue ASA + Plavix for a minimum of 12 months post DES. - Lipids under good control w/ Repatha + Zetia (LDL 66 7/22). Followed by Dr. Debara Pickett.    3. AKI on CKD IIIb - prior h/o CIN following LHC - SCr baseline 1.6-1.8 with multiple AKIs over the last year, Scr up to 3.2 during admission 10/22.  - Scr 2.46 on 11/04 - Followed by [facility]phrology in [facility] - CMET/Magnesium today  4. Mitral regurgitation - Severe central MR on TEE on 10/14.   - S/p TEER 05/18/2021 - two clips placed, one became  entangled and had to be deployed. - Moderate residual MR on echo 11/04 - Will f/u with structural team with repeat echo in 4-6 weeks - May potentially consider reclipping in the future depending on f/u echo, but need to wait at least 3 months to allow adequate fibrosis of previously placed clips.    5. OSA -  CPAP qhs.    6. DM1 - Hgb A1c 6.6. - No SGLT2i   7. Chronic Anemia - Baseline Hgb 8-9 range, likely 2/2 renal disease.  - Hx severe epistaxis and ABLA requiring transfusion - Receives Procrit through nephrology - Hx rash with IV iron - Hgb 7.5 on 11/04. CBC today - Transfuse if Hgb < 7  8. Palpitations - Hx PAF s/p ablation at least 10 years ago. No documented recurrence. Not currently on anticoagulation. Would need to consider risk/benefits of anticoagulation if recurs especially with history of anemia requiring transfusion - Zio XT 14 day (8/22) showed mostly SB/SR no afib, 66 SVT runs, frequent isolated SVE (11.2%). - SR on ECG 11/04 - Continue carvedilol 12.5 mg BID  Follow up 3 weeks with APP to reassess volume status   Dana Huge, PA-C 05/25/21

## 2021-05-25 NOTE — Patient Instructions (Signed)
Visit Information   Goals Addressed   None    Patient Care Plan: CCM Pharmacy Care Plan     Problem Identified: chf, hld, dm   Priority: High  Onset Date: 11/17/2020     Long-Range Goal: Disease State Management   Start Date: 11/17/2020  Expected End Date: 11/17/2021  Recent Progress: On track  Priority: High  Note:     Current Barriers:  Unable to maintain control of diabetes  Pharmacist Clinical Goal(s):  Patient will maintain control of diabetes as evidenced by a1c and avoiding low blood sugar readings  through collaboration with PharmD and provider.   Interventions: 1:1 collaboration with [name], MD regarding development and update of comprehensive plan of care as evidenced by provider attestation and co-signature Inter-disciplinary care team collaboration (see longitudinal plan of care) Comprehensive medication review performed; medication list updated in electronic medical record  Hyperlipidemia: (LDL goal < 55) The ASCVD Risk score (Arnett DK, et al., 2019) failed to calculate for the following reasons:   The patient has a prior MI or stroke diagnosis Lab Results  Component Value Date   CHOL 175 10/17/2020   CHOL 101 09/30/2020   CHOL 121 06/28/2020   Lab Results  Component Value Date   HDL 43 10/17/2020   HDL 42 09/30/2020   HDL 51 06/28/2020   Lab Results  Component Value Date   LDLCALC 109 (H) 10/17/2020   LDLCALC 44 09/30/2020   LDLCALC 51 06/28/2020   Lab Results  Component Value Date   TRIG 130 10/17/2020   TRIG 77 09/30/2020   TRIG 100 06/28/2020   Lab Results  Component Value Date   CHOLHDL 4.1 10/17/2020   CHOLHDL 2.4 09/30/2020   CHOLHDL 2.4 06/28/2020  No results found for: LDLDIRECT -Not ideally controlled -Current treatment: Repatha sureclick every 2 weeks Ezetimibe 55m -Medications previously tried: pravastatin  -Current dietary patterns: working on hMirantwith dietician through insurance -Current exercise habits:  cardiac rehab. [facility]  -Educated on Cholesterol goals;  Importance of limiting foods high in cholesterol; Exercise goal of 150 minutes per week; -Counseled on diet and exercise extensively Recommended to continue current medication  Diabetes (A1c goal <7%) Lab Results  Component Value Date   HGBA1C 6.6 (H) 04/25/2021   HGBA1C 6.8 (H) 09/30/2020   HGBA1C 6.1 (H) 08/30/2020   Lab Results  Component Value Date   LDLCALC 109 (H) 10/17/2020   CREATININE 2.46 (H) 05/19/2021   Lab Results  Component Value Date   NA 133 (L) 05/19/2021   K 4.6 05/19/2021   CREATININE 2.46 (H) 05/19/2021   EGFR 22 (L) 05/09/2021   GFRNONAA 22 (L) 05/19/2021   GLUCOSE 215 (H) 05/19/2021   Lab Results  Component Value Date   WBC 14.0 (H) 05/19/2021   HGB 7.5 (L) 05/19/2021   HCT 23.9 (L) 05/19/2021   MCV 85.7 05/19/2021   PLT 246 05/19/2021  -Controlled -Current medications: Levemir 50 units daily  Novolog 2-10 units tid before meals  -Medications previously tried:  farxiga -Current home glucose readings fasting glucose:  May 2022: 99-105  November 2022: 05/25/21: 11 post prandial glucose: well controlled only low is when she gets busy and eats too late after her meal-time insulin -Reports hypoglycemic/hyperglycemic symptoms - 2 times in the past few weeks when she got busy and didn't eat as quickly -Current meal patterns:  breakfast:  Watching diet and improving choices for heart healthy, lower  sodium and lower blood sugar lunch:  not overly hungry at lunch time but does try to eat  dinner: avoiding fried foods, more fish (2-3 times weekly), baked chicken, utilizing portion control, not eating pizza since heart attack snacks: 1/2 peanut butter sandwich, has cut back  drinks:  has cut out diet coke -Current exercise: cardiac rehab -Educated on A1c and blood sugar goals; Exercise goal of 150 minutes per week; Prevention and  management of hypoglycemic episodes; Carbohydrate counting and/or plate method -Counseled to check feet daily and get yearly eye exams -Counseled on diet and exercise extensively Recommended to continue current medication. Patient started 3000 mcg daily of vitamin b-12 2 gummies daily. Working with dietician and has been losing some weight.   Heart Failure (Goal: manage symptoms and prevent exacerbations) BP Readings from Last 3 Encounters:  05/19/21 (!) 108/49  05/16/21 (!) 126/53  05/09/21 (!) 134/58  -Controlled -Last ejection fraction: 55-60% (Date: 09/30/2020) -HF type: Diastolic -NYHA Class: II (slight limitation of activity) -Current treatment: Torsemide 20 mg 2 tablets daily  Carvedilol 12.5 mg bid  Hydralazine 25 mg tid  Isosorbide mn 30 mg daily  -Weight: (Weighs herself everyday) Normal: 232 + 2 pounds 05/25/21: 232 BP: (Tests daily) 05/25/21: 132/57 -Medications previously tried:  Metolazone, spironolactone,  -Current dietary habits:  watching sodium  -Current exercise habits:  cardiac rehab currently  -Educated on Benefits of medications for managing symptoms and prolonging life Importance of blood pressure control -Counseled on diet and exercise extensively Recommended to continue current medication. Wearing support stockings every day.    Patient Goals/Self-Care Activities Patient will:  - take medications as prescribed focus on medication adherence by using pill box  check glucose 3-4 times daily, document, and provide at future appointments target a minimum of 150 minutes of moderate intensity exercise weekly engage in dietary modifications by limiting carbohydrates, sodium and fatty foods  Follow Up Plan: Telephone follow up appointment with care management team member scheduled for: Jan 2023      The patient verbalized understanding of instructions, educational materials, and care plan provided today and declined offer to receive copy of patient  instructions, educational materials, and care plan.  The pharmacy team will reach out to the patient again over the next 90 days.   Lane Hacker, [facility]

## 2021-05-25 NOTE — Addendum Note (Signed)
Encounter addended by: Joette Catching, PA-C on: 05/25/2021 4:09 PM  Actions taken: Clinical Note Signed

## 2021-05-26 ENCOUNTER — Encounter (HOSPITAL_COMMUNITY): Payer: Self-pay

## 2021-05-26 ENCOUNTER — Encounter (HOSPITAL_COMMUNITY): Payer: HMO

## 2021-05-26 ENCOUNTER — Other Ambulatory Visit (HOSPITAL_COMMUNITY): Payer: Self-pay | Admitting: *Deleted

## 2021-05-26 DIAGNOSIS — D649 Anemia, unspecified: Secondary | ICD-10-CM

## 2021-05-26 DIAGNOSIS — I5032 Chronic diastolic (congestive) heart failure: Secondary | ICD-10-CM

## 2021-05-26 LAB — COMPREHENSIVE METABOLIC PANEL
ALT: 16 IU/L (ref 0–32)
AST: 16 IU/L (ref 0–40)
Albumin/Globulin Ratio: 1.1 — ABNORMAL LOW (ref 1.2–2.2)
Albumin: 3.8 g/dL (ref 3.8–4.9)
Alkaline Phosphatase: 145 IU/L — ABNORMAL HIGH (ref 44–121)
BUN/Creatinine Ratio: 28 — ABNORMAL HIGH (ref 9–23)
BUN: 55 mg/dL — ABNORMAL HIGH (ref 6–24)
Bilirubin Total: 0.5 mg/dL (ref 0.0–1.2)
CO2: 23 mmol/L (ref 20–29)
Calcium: 9.1 mg/dL (ref 8.7–10.2)
Chloride: 96 mmol/L (ref 96–106)
Creatinine, Ser: 1.94 mg/dL — ABNORMAL HIGH (ref 0.57–1.00)
Globulin, Total: 3.6 g/dL (ref 1.5–4.5)
Glucose: 100 mg/dL — ABNORMAL HIGH (ref 70–99)
Potassium: 4.1 mmol/L (ref 3.5–5.2)
Sodium: 135 mmol/L (ref 134–144)
Total Protein: 7.4 g/dL (ref 6.0–8.5)
eGFR: 29 mL/min/{1.73_m2} — ABNORMAL LOW (ref >59)

## 2021-05-26 LAB — CBC
Hematocrit: 22.3 % — ABNORMAL LOW (ref 34.0–46.6)
Hemoglobin: 7.2 g/dL — ABNORMAL LOW (ref 11.1–15.9)
MCH: 26.6 pg (ref 26.6–33.0)
MCHC: 32.3 g/dL (ref 31.5–35.7)
MCV: 82 fL (ref 79–97)
Platelets: 296 10*3/uL (ref 150–450)
RBC: 2.71 x10E6/uL — CL (ref 3.77–5.28)
RDW: 14.4 % (ref 11.7–15.4)
WBC: 11.6 10*3/uL — ABNORMAL HIGH (ref 3.4–10.8)

## 2021-05-26 LAB — MAGNESIUM: Magnesium: 1.9 mg/dL (ref 1.6–2.3)

## 2021-05-30 ENCOUNTER — Other Ambulatory Visit: Payer: Self-pay

## 2021-05-30 ENCOUNTER — Ambulatory Visit (HOSPITAL_COMMUNITY)
Admission: RE | Admit: 2021-05-30 | Discharge: 2021-05-30 | Disposition: A | Discharge status: Home / Self Care | Payer: HMO | Source: Ambulatory Visit | Attending: Internal Medicine | Admitting: Internal Medicine

## 2021-05-30 DIAGNOSIS — D649 Anemia, unspecified: Secondary | ICD-10-CM | POA: Diagnosis not present

## 2021-05-30 DIAGNOSIS — I5032 Chronic diastolic (congestive) heart failure: Secondary | ICD-10-CM | POA: Insufficient documentation

## 2021-05-30 DIAGNOSIS — D631 Anemia in chronic kidney disease: Secondary | ICD-10-CM | POA: Diagnosis not present

## 2021-05-30 DIAGNOSIS — N184 Chronic kidney disease, stage 4 (severe): Secondary | ICD-10-CM | POA: Diagnosis not present

## 2021-05-30 LAB — PREPARE RBC (CROSSMATCH)

## 2021-05-30 MED ORDER — FUROSEMIDE 10 MG/ML IJ SOLN
INTRAMUSCULAR | Status: AC
  Filled 2021-05-30: qty 8

## 2021-05-30 MED ORDER — SODIUM CHLORIDE 0.9% IV SOLUTION: Freq: Once | INTRAVENOUS | Status: DC

## 2021-05-30 MED ORDER — FUROSEMIDE 10 MG/ML IJ SOLN
80.0000 mg | Freq: Once | INTRAMUSCULAR | Status: AC
  Administered 2021-05-30: 80 mg via INTRAVENOUS

## 2021-06-01 ENCOUNTER — Other Ambulatory Visit: Payer: Self-pay

## 2021-06-01 MED ORDER — HYDROCODONE-ACETAMINOPHEN 7.5-325 MG PO TABS: 1.0000 | ORAL_TABLET | Freq: Three times a day (TID) | ORAL | 0 refills | Status: DC | PRN

## 2021-06-02 ENCOUNTER — Other Ambulatory Visit: Payer: Self-pay

## 2021-06-02 ENCOUNTER — Other Ambulatory Visit: Payer: HMO

## 2021-06-02 DIAGNOSIS — N189 Chronic kidney disease, unspecified: Secondary | ICD-10-CM

## 2021-06-03 LAB — TYPE AND SCREEN
ABO/RH(D): A NEG
Antibody Screen: NEGATIVE
Unit division: 0
Unit division: 0

## 2021-06-03 LAB — BPAM RBC
Blood Product Expiration Date: 202211242359
Blood Product Expiration Date: 202211252359
ISSUE DATE / TIME: 202211150924
Unit Type and Rh: 600
Unit Type and Rh: 600

## 2021-06-05 ENCOUNTER — Other Ambulatory Visit: Payer: HMO

## 2021-06-05 ENCOUNTER — Other Ambulatory Visit: Payer: Self-pay

## 2021-06-05 DIAGNOSIS — D631 Anemia in chronic kidney disease: Secondary | ICD-10-CM | POA: Diagnosis not present

## 2021-06-05 DIAGNOSIS — N189 Chronic kidney disease, unspecified: Secondary | ICD-10-CM

## 2021-06-06 LAB — CBC
Hematocrit: 25.6 % — ABNORMAL LOW (ref 34.0–46.6)
Hemoglobin: 8 g/dL — ABNORMAL LOW (ref 11.1–15.9)
MCH: 26.1 pg — ABNORMAL LOW (ref 26.6–33.0)
MCHC: 31.3 g/dL — ABNORMAL LOW (ref 31.5–35.7)
MCV: 83 fL (ref 79–97)
Platelets: 301 10*3/uL (ref 150–450)
RBC: 3.07 x10E6/uL — ABNORMAL LOW (ref 3.77–5.28)
RDW: 14.5 % (ref 11.7–15.4)
WBC: 9.2 10*3/uL (ref 3.4–10.8)

## 2021-06-06 NOTE — Telephone Encounter (Signed)
Hemoglobin up to 8.0. Discussed with Dr. [name]. Would not recommend transfusing further unless hemoglobin less than 7.5.   Recommend f/u with provider who has been managing anemia previously.

## 2021-06-12 ENCOUNTER — Other Ambulatory Visit: Payer: Self-pay

## 2021-06-12 MED ORDER — ALLOPURINOL 100 MG PO TABS: 100.0000 mg | ORAL_TABLET | Freq: Every day | ORAL | 0 refills | Status: DC

## 2021-06-12 MED ORDER — PANTOPRAZOLE SODIUM 40 MG PO TBEC: 40.0000 mg | DELAYED_RELEASE_TABLET | Freq: Every day | ORAL | 0 refills | Status: DC

## 2021-06-16 ENCOUNTER — Other Ambulatory Visit: Payer: Self-pay

## 2021-06-16 MED ORDER — LORAZEPAM 0.5 MG PO TABS
0.5000 mg | ORAL_TABLET | Freq: Every day | ORAL | 2 refills | Status: DC | PRN
Start: 2021-06-16 — End: 2021-07-04

## 2021-06-19 ENCOUNTER — Other Ambulatory Visit (HOSPITAL_COMMUNITY): Payer: HMO

## 2021-06-19 ENCOUNTER — Ambulatory Visit: Payer: HMO | Admitting: Cardiology

## 2021-06-19 ENCOUNTER — Other Ambulatory Visit: Payer: HMO

## 2021-06-19 ENCOUNTER — Other Ambulatory Visit: Payer: Self-pay

## 2021-06-19 DIAGNOSIS — D631 Anemia in chronic kidney disease: Secondary | ICD-10-CM | POA: Diagnosis not present

## 2021-06-19 DIAGNOSIS — N189 Chronic kidney disease, unspecified: Secondary | ICD-10-CM

## 2021-06-20 LAB — CBC
Hematocrit: 24.5 % — ABNORMAL LOW (ref 34.0–46.6)
Hemoglobin: 7.9 g/dL — ABNORMAL LOW (ref 11.1–15.9)
MCH: 26.8 pg (ref 26.6–33.0)
MCHC: 32.2 g/dL (ref 31.5–35.7)
MCV: 83 fL (ref 79–97)
Platelets: 240 10*3/uL (ref 150–450)
RBC: 2.95 x10E6/uL — ABNORMAL LOW (ref 3.77–5.28)
RDW: 14.5 % (ref 11.7–15.4)
WBC: 8.3 10*3/uL (ref 3.4–10.8)

## 2021-06-20 NOTE — Progress Notes (Incomplete)
Advanced Heart Failure Clinic Note   PCP: CoxElnita Maxwell, MD PCP-Cardiologist: Dana Bickers, MD   Reason for Visit: Hospital f/u acute on chronic diastolic HF  HPI: [name] is 60 y.o. female with history of diastolic heart failure s/p cardiomems 02/2018, CAD, s/p DES RCA and LAD 2018, DMI, htn, hyperlipidemia, PAD, and OSA on CPAP, PAF s/p ablation, PAD s/p L great toe and 4th toe amputation, stage IIIb CKD.   Admitted 03/22 with NSTEMI and a/c CHF. LHC 03/22 showed moderate mid-LAD and mid-RCA disease, severe mid-L-Cx stenosis s/p PCI/DES mid Cx and side branch angioplasty of OM1.   Unfortunately she developed a contrast-induced nephropathy after cath and creatinine increased from 2.2 to a peak of 5.5. Additionally, she developed severe nausea and vomiting, gastroparesis and required IV antiplatelet therapy.  During this time she developed epistaxis and acute blood loss anemia requiring transfusion.  She was switched back to oral DAPT. Diuretics were reintroduced and her creatinine began to improve over the following days. Her leukocytosis persisted but all her infectious workup was negative and this was felt to be reactive.    Per chart review, she was admitted 6/22 to [facility] for DKA, started on insulin gtt, IVF and diuretics held. Developed pulmonary vascular congestion vs PNA and was started on IV abx, and diuretics were resumed. She suffered a PEA arrest 01/12/21 with ROSC after 10-15 minutes of CPR and was transferred to [place]; precipitating factor for PEA arrest thought to be respiratory driven. On arrival, she was still in DKA, AKI on CKD with oliguria and Nephrology was consulted for further management. TTE with EF 60%, min MR/TR. Concern for stroke due to left-sided weakness and MRI confirmed anoxic brain injury, but no definite infarct. She was extubated, SCr returned to baseline of 1.6-1.8 and she was discharged to rehab facility.   Zio placed 8/22 for palpitations, showing mostly  SB/SR no afib, 66 SVT runs, frequent isolated SVE (11.2%).  Admitted from AHF clinic 10/22 for A/C CHF after failing outpatient escalation in diuretic therapy. She was enrolled in FASTR trial and diuresed > 15 lb.  Hospitalization complicated by AKI on CKD, Scr up to 3.13. RHC showed mild to moderate elevating filling pressures with large v-waves in PCWP tracing suggesting severe MR vs severe diastolic dysfunction. TEE showed severe central MR.   Patient underwent transcatheter edge-to-edge repair of mitral valve with placement of 1XT W and 1NT W clip on 05/18/2021. NT W clip was entangled and had to be deployed into lateral part of valve. No additional clip placement could be preformed. Echo 11/04 with EF 50-55% and residual moderate MR. Given 1 dose IV lasix prior to discharge.   Seen in structural clinic for f/u today. Planning for repeat echo and f/u in 4-6 weeks.   Here today for close HF f/u. Reports a little more shortness of breath and increased leg edema the last few days. Weight up 4 lb from clinic visit 10/12. No CP. Taking 60 mg Torsemide daily. Watching salt and fluid intake. Has had intermittent palpitations for some time, have not increased in frequency.   No recurrent nosebleeds. Denies melena. Did have some oozing from groin site after recent procedure. Hgb 9.0 10/24> 7.9  11/01 > 7.5 11/04.      Cardiac Studies: - RHC (10/22):   RA = 8 RV = 59/9 PA = 61/17 (35) PCW = mean 23 (v = 49) -> performed multiple wedges Fick cardiac output/index = PVR = 1.6 WU FA sat =  98% PA sat = 69%, 70% PaPi = 5.5 Assessment: 1. Mild to moderately elevated filling pressures with very large v-waves in PCWP tracing suggestive of severe diastolic dysfunction vs severe MR  - TEE (10/22): EF 55-60%, basal inferior akinesis, severe central MR. - Echo (10/22): EF 65-70%, RV ok, moderate MR - Echo (5/21): EF 60-65%, RV ok, Grade II DD. - Echo (2/22): EF 55-60%, RV ok, Grade II DD, mod MR - Echo  (3/22): EF 55-60%, Grade II DD, normal RV - Echo (6/22): EF 60%, mod MR/TR (per chart review at [place]). - Zio (8/19): no arrhythmias, occasional PAC/PVCs - Zio (9/22): mostly SB/SR no afib, 66 SVT runs, frequent isolated SVE (11.2%).  - LHC (3/22): 3 vessel CAD with moderate mid-LAD and mid-RCA stenoses, and severe mid-LCx stenosis; successful PCI of the mid-circumflex, reducing severe 90% stenosis to 0% with a 2.75x22 mm Resolute Onyx DES, provisional side branch angioplasty of the OM1 with a 2.5 mm balloon through the stent struts  Review of systems complete and found to be negative unless listed in HPI.    Past Medical History:  Diagnosis Date   Acquired absence of left great toe (HCC)    Acquired absence of other left toe(s) ([place])    Anemia    Anxiety    CAD (coronary artery disease)    DES mLAD 04/07/15; DES dRCA & DES pPDA 08/30/16; DES mCX & PTCA OM1 09/29/20   Chronic diastolic (congestive) heart failure (HCC)    Chronic systolic (congestive) heart failure (HCC)    CKD (chronic kidney disease)    Contrast dye induced nephropathy, possible 09/03/2020   Diabetes (HCC)    Diastolic CHF (HCC)    Dyspnea    Gastro-esophageal reflux disease without esophagitis    Hyperlipidemia    Hypertension    Hypertensive heart disease with heart failure (HCC)    Hypoxemia    Idiopathic gout, multiple sites    Localized edema    Low back pain    Mitral regurgitation    Mixed hyperlipidemia    Mixed incontinence    Morbid (severe) obesity due to excess calories (HCC)    Neuromuscular disorder (HCC)    Neuropathy    Non-ST elevation (NSTEMI) myocardial infarction ([place])    08/29/20, 09/29/20   OSA (obstructive sleep apnea) 09/03/2020   Other specified hypothyroidism    PAF (paroxysmal atrial fibrillation) (HCC)    s/p pulmonary vein isolation by cryoablation 10/04/15 Dr. Minna Merritts in [facility]   PEA (Pulseless electrical activity) [facility])    ~ 01/13/21 at [facility] during admission DKA,  volume overload, possible CAP, hypoxia s/p ACLS with ROSC ~ 10-15   Pneumonia    Primary insomnia    Type 2 diabetes mellitus with diabetic nephropathy (HCC)     Current Outpatient Medications  Medication Sig Dispense Refill   allopurinol (ZYLOPRIM) 100 MG tablet Take 1 tablet (100 mg total) by mouth daily. 90 tablet 0   amoxicillin (AMOXIL) 500 MG tablet Take 4 tablets by mouth 1 hour prior to dental procedures and cleanings (Patient not taking: Reported on 05/25/2021) 12 tablet 6   aspirin EC 81 MG EC tablet Take 1 tablet (81 mg total) by mouth daily. Swallow whole. 90 tablet 3   BD PEN NEEDLE NANO 2ND GEN 32G X 4 MM MISC 4 (four) times daily.     carvedilol (COREG) 12.5 MG tablet Take 1 tablet (12.5 mg total) by mouth 2 (two) times daily. 60 tablet 11   Cholecalciferol (  VITAMIN D) 50 MCG (2000 UT) tablet Take 2,000 Units by mouth daily. Take one daily     clopidogrel (PLAVIX) 75 MG tablet Take 1 tablet (75 mg total) by mouth at bedtime. 90 tablet 3   Cyanocobalamin 3000 MCG CAPS Take 2 capsules by mouth daily. Patient purchased 3000 mcg gummies and taking 2 gummies daily     EPINEPHrine 0.3 mg/0.3 mL IJ SOAJ injection Use as directed for life-threatening allergic reaction. 4 Device 3   Evolocumab (REPATHA SURECLICK) 528 MG/ML SOAJ Inject 1 Dose into the skin every 14 (fourteen) days. 6 mL 3   ezetimibe (ZETIA) 10 MG tablet Take 1 tablet (10 mg total) by mouth daily. 90 tablet 2   ferrous sulfate 325 (65 FE) MG tablet Take 325 mg by mouth 2 (two) times daily with a meal.     hydrALAZINE (APRESOLINE) 25 MG tablet Take 1 tablet (25 mg total) by mouth 3 (three) times daily. 270 tablet 0   HYDROcodone-acetaminophen (NORCO) 7.5-325 MG tablet Take 1 tablet by mouth 3 (three) times daily as needed for moderate pain. 90 tablet 0   insulin aspart (NOVOLOG) 100 UNIT/ML injection Inject 2-10 Units into the skin 3 (three) times daily before meals.     isosorbide mononitrate (IMDUR) 30 MG 24 hr tablet  Take 1 tablet (30 mg total) by mouth daily. 90 tablet 3   lactulose (CHRONULAC) 10 GM/15ML solution Take 15 mLs (10 g total) by mouth daily as needed for mild constipation. 236 mL 0   latanoprost (XALATAN) 0.005 % ophthalmic solution Place 1 drop into both eyes at bedtime.     LEVEMIR FLEXTOUCH 100 UNIT/ML FlexPen Inject 50 Units into the skin daily.     LORazepam (ATIVAN) 0.5 MG tablet Take 1 tablet (0.5 mg total) by mouth daily as needed for anxiety. 30 tablet 2   magnesium oxide (MAG-OX) 400 MG tablet Take 2 tablets (800 mg total) by mouth daily. Take 2 (400 mg) daily=800 mg 60 tablet 1   nitroGLYCERIN (NITROSTAT) 0.4 MG SL tablet Place 1 tablet (0.4 mg total) under the tongue every 5 (five) minutes as needed for chest pain. 25 tablet 3   Omega-3 Fatty Acids (FISH OIL PO) Take 1 capsule by mouth daily.     OXYGEN Inhale 2 L into the lungs at bedtime.     pantoprazole (PROTONIX) 40 MG tablet Take 1 tablet (40 mg total) by mouth daily. 90 tablet 0   potassium chloride SA (KLOR-CON) 20 MEQ tablet Take 1 tablet (20 mEq total) by mouth daily. 90 tablet 3   Probiotic CAPS Take 1 capsule by mouth daily.     promethazine (PHENERGAN) 25 MG tablet Take 1 tablet (25 mg total) by mouth every 6 (six) hours as needed for nausea or vomiting. 90 tablet 1   torsemide (DEMADEX) 20 MG tablet Take 3 tablets (60 mg total) by mouth daily. 90 tablet 3   No current facility-administered medications for this visit.   Allergies  Allergen Reactions   Naproxen Hives and Rash   Celecoxib Other (See Comments)    Stomach pain   Aspirin Other (See Comments)    CAN NOT TAKE DUE TO ULCERS (documented previously) but takes every day currently (as of 09/29/20) without reported issues Can take coated aspirin   Glucosamine Hives, Swelling and Other (See Comments)    Angioedema    Iron     IV iron transfusion - hives - Feb 2022 hospitalization   Metoclopramide Other (See Comments)  Facial twitching and stuttering    Metolazone Other (See Comments)    Acute renal failure   Shellfish-Derived Products Hives, Swelling and Other (See Comments)    Angioedema    Tramadol Nausea And Vomiting        Social History   Socioeconomic History   Marital status: Married    Spouse name: Not on file   Number of children: Not on file   Years of education: Not on file   Highest education level: Not on file  Occupational History   Not on file  Tobacco Use   Smoking status: Former   Smokeless tobacco: Never  Vaping Use   Vaping Use: Never used  Substance and Sexual Activity   Alcohol use: Not Currently   Drug use: Not Currently   Sexual activity: Not on file  Other Topics Concern   Not on file  Social History Narrative   Not on file   Social Determinants of Health   Financial Resource Strain: Low Risk    Difficulty of Paying Living Expenses: Not very hard  Food Insecurity: No Food Insecurity   Worried About Running Out of Food in the Last Year: Never true   Ran Out of Food in the Last Year: Never true  Transportation Needs: No Transportation Needs   Lack of Transportation (Medical): No   Lack of Transportation (Non-Medical): No  Physical Activity: Not on file  Stress: Not on file  Social Connections: Not on file  Intimate Partner Violence: Not on file   Family History  Problem Relation Age of Onset   Hypertension Father    Coronary artery disease Mother    Hypertension Mother    Hyperlipidemia Mother    Diabetes type II Mother    Breast cancer Mother    Lung cancer Mother    Bipolar disorder Other    Depression Other    Schizophrenia Other    There were no vitals taken for this visit.  Wt Readings from Last 3 Encounters:  05/25/21 107 kg (235 lb 12.8 oz)  05/25/21 107.5 kg (237 lb)  05/18/21 105 kg (231 lb 8 oz)   PHYSICAL EXAM: General:  Sitting up in wheelchair, no distress. Husband and daughter present. HEENT: normal Neck: supple. JVD difficult to assess. Carotids 2+ bilat; no  bruits.  Cor: Regular rate and rhythm.  No rubs, gallops, 2/6 holosystolic murmur Lungs: clear Abdomen: soft, nontender, nondistended. No hepatosplenomegaly.  Extremities: no cyanosis, clubbing, rash, 2+ LE edema, compression stockings on Neuro: alert & orientedx3, cranial nerves grossly intact. moves all 4 extremities w/o difficulty. Affect pleasant   CardioMEMs: Goal dPAP: 10  Today's dPAP: 12   ASSESSMENT & PLAN: 1.  Acute on chronic Diastolic Heart Failure - Echo 08/2020 EF 55-60%  - Echo (3/22): EF 55-60%, Grade II DD, normal RV - Echo (6/22): EF 60%, mod MR/TR (per chart review at [place]). - RHC (10/22): mild to moderate elevated filling pressures, large v-waves PCWP tracing - TEE (10/22): EF 55-60%, basal inferior akinesis, severe central MR. - Recent admit 10/22 with a/c CHF, enrolled in FASTR trial and diuresed 15 lbs. Severe MR likely contributing to decompensation.  - NYHA IIIa. Volume appears slightly up. More dyspnea and leg edema. Today's dPAP 12, goal 10. Takes 60 mg Torsemide daily. Will have her take an extra 60 mg X 2 days.  - Continue carvedilol 12.5 mg bid. - Continue hydralazine 25 mg tid + Imdur 30 mg daily. - No SGLT2i with T1DM. - Continue  TED hose   2. CAD - Known CAD with prior stents to mid LAD and pRCA.  - NSTEMI (2/22). Cath deferred due to AKI. Treated medically.  - NSTEMI (7/22)-->S/p PCI/DES to mid Cx, balloon angioplasty to OM1  - Denies CP - Ideally continue ASA + Plavix for a minimum of 12 months post DES. - Lipids under good control w/ Repatha + Zetia (LDL 66 7/22). Followed by Dr. Debara Pickett.    3. AKI on CKD IIIb - prior h/o CIN following LHC - SCr baseline 1.6-1.8 with multiple AKIs over the last year, Scr up to 3.2 during admission 10/22.  - Scr 2.46 on 11/04 - Followed by Nephrology in [facility] - CMET/Magnesium today  4. Mitral regurgitation - Severe central MR on TEE on 10/14.   - S/p TEER 05/18/2021 - two clips placed, one became  entangled and had to be deployed. - Moderate residual MR on echo 11/04 - Will f/u with structural team with repeat echo in 4-6 weeks - May potentially consider reclipping in the future depending on f/u echo, but need to wait at least 3 months to allow adequate fibrosis of previously placed clips.    5. OSA -  CPAP qhs.    6. DM1 - Hgb A1c 6.6. - No SGLT2i   7. Chronic Anemia - Baseline Hgb 8-9 range, likely 2/2 renal disease.  - Hx severe epistaxis and ABLA requiring transfusion - Receives Procrit through nephrology - Hx rash with IV iron - Hgb 7.5 on 11/04. CBC today - Transfuse if Hgb < 7  8. Palpitations - Hx PAF s/p ablation at least 10 years ago. No documented recurrence. Not currently on anticoagulation. Would need to consider risk/benefits of anticoagulation if recurs especially with history of anemia requiring transfusion - Zio XT 14 day (8/22) showed mostly SB/SR no afib, 66 SVT runs, frequent isolated SVE (11.2%). - SR on ECG 11/04 - Continue carvedilol 12.5 mg BID  Follow up 3 weeks with APP to reassess volume status   Dana Huge, PA-C 06/20/21

## 2021-06-21 ENCOUNTER — Encounter (HOSPITAL_COMMUNITY): Payer: HMO

## 2021-06-27 ENCOUNTER — Other Ambulatory Visit (HOSPITAL_COMMUNITY): Payer: Self-pay | Admitting: Internal Medicine

## 2021-06-27 DIAGNOSIS — R8281 Pyuria: Secondary | ICD-10-CM | POA: Diagnosis not present

## 2021-06-27 DIAGNOSIS — I340 Nonrheumatic mitral (valve) insufficiency: Secondary | ICD-10-CM | POA: Diagnosis not present

## 2021-06-27 DIAGNOSIS — N1832 Chronic kidney disease, stage 3b: Secondary | ICD-10-CM | POA: Diagnosis not present

## 2021-06-27 DIAGNOSIS — D631 Anemia in chronic kidney disease: Secondary | ICD-10-CM | POA: Diagnosis not present

## 2021-06-27 DIAGNOSIS — I5032 Chronic diastolic (congestive) heart failure: Secondary | ICD-10-CM | POA: Diagnosis not present

## 2021-06-27 DIAGNOSIS — E559 Vitamin D deficiency, unspecified: Secondary | ICD-10-CM | POA: Diagnosis not present

## 2021-06-27 DIAGNOSIS — N179 Acute kidney failure, unspecified: Secondary | ICD-10-CM | POA: Diagnosis not present

## 2021-06-27 DIAGNOSIS — G4733 Obstructive sleep apnea (adult) (pediatric): Secondary | ICD-10-CM | POA: Diagnosis not present

## 2021-06-27 DIAGNOSIS — E1022 Type 1 diabetes mellitus with diabetic chronic kidney disease: Secondary | ICD-10-CM | POA: Diagnosis not present

## 2021-06-27 DIAGNOSIS — R8290 Unspecified abnormal findings in urine: Secondary | ICD-10-CM | POA: Diagnosis not present

## 2021-06-27 DIAGNOSIS — I130 Hypertensive heart and chronic kidney disease with heart failure and stage 1 through stage 4 chronic kidney disease, or unspecified chronic kidney disease: Secondary | ICD-10-CM | POA: Diagnosis not present

## 2021-06-27 DIAGNOSIS — I25119 Atherosclerotic heart disease of native coronary artery with unspecified angina pectoris: Secondary | ICD-10-CM | POA: Diagnosis not present

## 2021-06-27 DIAGNOSIS — R300 Dysuria: Secondary | ICD-10-CM | POA: Diagnosis not present

## 2021-06-28 DIAGNOSIS — E1022 Type 1 diabetes mellitus with diabetic chronic kidney disease: Secondary | ICD-10-CM | POA: Diagnosis not present

## 2021-06-28 DIAGNOSIS — I252 Old myocardial infarction: Secondary | ICD-10-CM | POA: Diagnosis not present

## 2021-06-28 DIAGNOSIS — N184 Chronic kidney disease, stage 4 (severe): Secondary | ICD-10-CM | POA: Diagnosis not present

## 2021-06-28 DIAGNOSIS — Z955 Presence of coronary angioplasty implant and graft: Secondary | ICD-10-CM | POA: Diagnosis not present

## 2021-06-28 DIAGNOSIS — Z952 Presence of prosthetic heart valve: Secondary | ICD-10-CM | POA: Diagnosis not present

## 2021-07-03 ENCOUNTER — Encounter (HOSPITAL_COMMUNITY): Payer: HMO

## 2021-07-04 ENCOUNTER — Telehealth: Payer: Self-pay

## 2021-07-04 ENCOUNTER — Other Ambulatory Visit: Payer: Self-pay

## 2021-07-04 MED ORDER — HYDROCODONE-ACETAMINOPHEN 7.5-325 MG PO TABS: 1.0000 | ORAL_TABLET | Freq: Three times a day (TID) | ORAL | 0 refills | Status: DC | PRN

## 2021-07-04 MED ORDER — LORAZEPAM 0.5 MG PO TABS
0.5000 mg | ORAL_TABLET | Freq: Every day | ORAL | 2 refills | Status: DC | PRN
Start: 2021-07-04 — End: 2021-08-01

## 2021-07-04 NOTE — Telephone Encounter (Signed)
[facility] out of stock of Hydrocodone for next 2-3 weeks per patient. [facility] has in stock. Will ask PCP to send new script  Verbal consent obtained for [facility] enhanced pharmacy services (medication synchronization, adherence packaging, delivery coordination). A medication sync plan was created to allow patient to get all medications delivered once every 30 to 90 days per patient preference. Patient understands they have freedom to choose pharmacy and clinical pharmacist will coordinate care between all prescribers and [facility].

## 2021-07-05 ENCOUNTER — Encounter (HOSPITAL_COMMUNITY): Payer: HMO | Admitting: Internal Medicine

## 2021-07-05 ENCOUNTER — Telehealth: Payer: Self-pay

## 2021-07-05 ENCOUNTER — Other Ambulatory Visit: Payer: Self-pay | Admitting: Family Medicine

## 2021-07-05 MED ORDER — HYDROCODONE-ACETAMINOPHEN 7.5-325 MG PO TABS: 1.0000 | ORAL_TABLET | Freq: Three times a day (TID) | ORAL | 0 refills | Status: DC | PRN

## 2021-07-05 NOTE — Chronic Care Management (AMB) (Signed)
Chronic Care Management Pharmacy Assistant   Name: Dana [name]  MRN: [id number] DOB: 01/11/1961  Reason for Encounter: Medication refill follow up  07/05/2021- Missed patient call, called patient back she is requesting follow up on Hydrocodone that was to be delivered to patient from [facility]. Patient stated she spoke with [facility]PP and due to [facility] being out of Hydrocodone, pharmacist was going to request prescription to be sent to [facility] who has Hydrocodone in stock. Request sent to Dr. Tobie Poet yesterday for a new prescription to be sent, awaiting provider response. Patient aware, I inquired if there is another pharmacy she would like for me to check to see if they have Hydrocodone on hand, [facility] suggested [facility] [place].  Called [facility] on [address] in [place], spoke with Education administrator, she informed me that they are not accepting new pain medication/controlled substance patients due to only ordering enough for the current patients who use the pharmacy. Technician checked to see if patient has ever used this [facility] before and per tech patient has not used this or any [facility] in before, no profile found.  Called patient back to inform, suggested patient reach out or stop by Dr Tobie Poet office to request a hard copy prescription so she can find a pharmacy that will fill for her. Patient aware that [facility] will not be able to deliver today if prescription received after 2 pm today but will be able to deliver tomorrow. Patient will contact Dr Tobie Poet office and/or drop by to retrieve prescription.  07/06/2021- Follow up- Prescription was sent to [facility] by Dr Tobie Poet on 07/05/2021, prescription is out for delivery per [facility].   Medications: Outpatient Encounter Medications as of 07/05/2021  Medication Sig   allopurinol (ZYLOPRIM) 100 MG tablet Take 1 tablet (100 mg total) by mouth daily.   amoxicillin (AMOXIL) 500 MG tablet Take 4 tablets by mouth 1 hour prior to dental  procedures and cleanings (Patient not taking: Reported on 05/25/2021)   aspirin EC 81 MG EC tablet Take 1 tablet (81 mg total) by mouth daily. Swallow whole.   BD PEN NEEDLE NANO 2ND GEN 32G X 4 MM MISC 4 (four) times daily.   carvedilol (COREG) 12.5 MG tablet Take 1 tablet (12.5 mg total) by mouth 2 (two) times daily.   Cholecalciferol (VITAMIN D) 50 MCG (2000 UT) tablet Take 2,000 Units by mouth daily. Take one daily   clopidogrel (PLAVIX) 75 MG tablet Take 1 tablet (75 mg total) by mouth at bedtime.   Cyanocobalamin 3000 MCG CAPS Take 2 capsules by mouth daily. Patient purchased 3000 mcg gummies and taking 2 gummies daily   EPINEPHrine 0.3 mg/0.3 mL IJ SOAJ injection Use as directed for life-threatening allergic reaction.   Evolocumab (REPATHA SURECLICK) 466 MG/ML SOAJ Inject 1 Dose into the skin every 14 (fourteen) days.   ezetimibe (ZETIA) 10 MG tablet Take 1 tablet (10 mg total) by mouth daily.   ferrous sulfate 325 (65 FE) MG tablet Take 325 mg by mouth 2 (two) times daily with a meal.   hydrALAZINE (APRESOLINE) 25 MG tablet TAKE 1 TABLET BY MOUTH THREE TIMES DAILY   HYDROcodone-acetaminophen (NORCO) 7.5-325 MG tablet Take 1 tablet by mouth 3 (three) times daily as needed for moderate pain.   insulin aspart (NOVOLOG) 100 UNIT/ML injection Inject 2-10 Units into the skin 3 (three) times daily before meals.   isosorbide mononitrate (IMDUR) 30 MG 24 hr tablet Take 1 tablet (30 mg total) by mouth daily.  lactulose (CHRONULAC) 10 GM/15ML solution Take 15 mLs (10 g total) by mouth daily as needed for mild constipation.   latanoprost (XALATAN) 0.005 % ophthalmic solution Place 1 drop into both eyes at bedtime.   LEVEMIR FLEXTOUCH 100 UNIT/ML FlexPen Inject 50 Units into the skin daily.   LORazepam (ATIVAN) 0.5 MG tablet Take 1 tablet (0.5 mg total) by mouth daily as needed for anxiety.   magnesium oxide (MAG-OX) 400 MG tablet Take 2 tablets (800 mg total) by mouth daily. Take 2 (400 mg) daily=800  mg   nitroGLYCERIN (NITROSTAT) 0.4 MG SL tablet Place 1 tablet (0.4 mg total) under the tongue every 5 (five) minutes as needed for chest pain.   Omega-3 Fatty Acids (FISH OIL PO) Take 1 capsule by mouth daily.   OXYGEN Inhale 2 L into the lungs at bedtime.   pantoprazole (PROTONIX) 40 MG tablet Take 1 tablet (40 mg total) by mouth daily.   potassium chloride SA (KLOR-CON) 20 MEQ tablet Take 1 tablet (20 mEq total) by mouth daily.   Probiotic CAPS Take 1 capsule by mouth daily.   promethazine (PHENERGAN) 25 MG tablet Take 1 tablet (25 mg total) by mouth every 6 (six) hours as needed for nausea or vomiting.   torsemide (DEMADEX) 20 MG tablet Take 3 tablets (60 mg total) by mouth daily.   No facility-administered encounter medications on file as of 07/05/2021.   Pattricia Boss, [facility] Pharmacist Assistant [phone]

## 2021-07-06 DIAGNOSIS — G4733 Obstructive sleep apnea (adult) (pediatric): Secondary | ICD-10-CM | POA: Diagnosis not present

## 2021-07-10 NOTE — Progress Notes (Addendum)
[facility] [place]                                     Cardiology Office Note:    Date:  07/12/2021   ID:  D[name], DOB Dec 29, 1960, MRN [id number]  PCP:  Dana Brome, MD  [name] Cardiologist:  Dana Bickers, MD /Dr. [name] (structural)  Referring MD: Dana Brome, MD   Chief Complaint  Patient presents with   Follow-up    1 month s/p TEER   History of Present Illness:    Dana [name] is a 60 y.o. female with a hx of  CAD s/p previous PCIs, CKD stage IV, PAD s/p toe amputation, PAF s/p ablation, chronic diastolic CHF, DMT1, and severe functional MR s/p TEER (05/18/21) who presents to clinic for follow up.    Admitted 03/22 with NSTEMI and a/c CHF. LHC 03/22 showed moderate mid-LAD and mid-RCA disease, severe mid-L-Cx stenosis s/p PCI/DES mid Cx and side branch angioplasty of OM1. Unfortunately she developed a contrast-induced nephropathy after cath and creatinine increased from 2.2 to a peak of 5.5. Additionally, she developed severe nausea and vomiting, gastroparesis and required IV antiplatelet therapy. During this time she developed epistaxis and acute blood loss anemia requiring transfusion. She was switched back to oral DAPT. Diuretics were reintroduced and her creatinine began to improve over the following days. Her leukocytosis persisted but all her infectious workup was negative and this was felt to be reactive.     Per chart review, she was admitted 6/22 to [name] for DKA, started on insulin gtt, IVF and diuretics held. Developed pulmonary vascular congestion vs PNA and was started on IV abx, and diuretics were resumed. She suffered a PEA arrest 01/12/21 with ROSC after 10-15 minutes of CPR and was transferred to [name]; precipitating factor for PEA arrest thought to be respiratory driven. On arrival, she was still in DKA, AKI on CKD with oliguria and Nephrology was consulted for further management. TTE with EF 60%,  min MR/TR. Concern for stroke due to left-sided weakness and MRI confirmed anoxic brain injury, but no definite infarct. She was extubated, SCr returned to baseline of 1.6-1.8 and she was discharged to rehab facility.    Zio placed 8/22 for palpitations, showing mostly SB/SR no afib, 66 SVT runs, frequent isolated SVE (11.2%).   Admitted from AHF clinic 10/22 for A/C CHF after failing outpatient escalation in diuretic therapy. She was enrolled in FASTR trial and diuresed > 15 lb. Hospitalization complicated by AKI on CKD, Scr up to 3.13. RHC showed mild to moderate elevating filling pressures with large v-waves in PCWP tracing suggesting severe MR vs severe diastolic dysfunction. TEE showed severe central MR. She was referred to structural heart clinic to discuss management of MR.    She underwent transcatheter mitral edge-to-edge repair with placement of 1 XT W clip and 1 NT W clip.  The NT W clip was entangled and had to be deployed in the lateral part of the valve. No further clip placement could be pursued due to the proximity of both clips. The final V waves were 52 mmHg with residual moderate to severe mitral regurgitation. She was continued on DAPT with aspirin and plavix. POD1 echo showed EF 50-55%, normally functioning clips with moderate residual MR with a mean gradient of 5 mm hg. Pulmonary vein flow could not be obtained.  She was seen in follow up 05/25/21 at which time she continued to feel unwell. She reported chronic dyspnea on exertion which was unchanged since her MitraClip procedure with mild worsening in her lower extremity edema. Case discussed with Dr. Ali [name] and plans were to follow for at least 3 months and re-evaluate for potential re-clip.   Today she is here with her husband. She reports that since she was last seen by our team post TEER, she feels much better. She has recently restarted cardiac rehab and is tolerating this well. She feels that her MitraClip has helped  tremendously with her symptoms. She is able to ambulate longer distances and can tolerate more activity than before. Her HF symptoms have been better controlled. She was seen by AHF APP earlier today with no change in her regimen. She follows with Dr. [name] mid January. She denies SOB, LE edema, palpitations, dizziness, orthopnea, or syncope. Her weight is very stable and she is tolerating medications.    Past Medical History:  Diagnosis Date   Acquired absence of left great toe ([place])    Acquired absence of other left toe(s) ([place])    Anemia    Anxiety    CAD (coronary artery disease)    DES mLAD 04/07/15; DES dRCA & DES pPDA 08/30/16; DES mCX & PTCA OM1 09/29/20   Chronic diastolic (congestive) heart failure (HCC)    Chronic systolic (congestive) heart failure (HCC)    CKD (chronic kidney disease)    Contrast dye induced nephropathy, possible 09/03/2020   Diabetes (HCC)    Diastolic CHF (HCC)    Dyspnea    Gastro-esophageal reflux disease without esophagitis    Hyperlipidemia    Hypertension    Hypertensive heart disease with heart failure (HCC)    Hypoxemia    Idiopathic gout, multiple sites    Localized edema    Low back pain    Mitral regurgitation    Mixed hyperlipidemia    Mixed incontinence    Morbid (severe) obesity due to excess calories (HCC)    Neuromuscular disorder (HCC)    Neuropathy    Non-ST elevation (NSTEMI) myocardial infarction ([place])    08/29/20, 09/29/20   OSA (obstructive sleep apnea) 09/03/2020   Other specified hypothyroidism    PAF (paroxysmal atrial fibrillation) ([place])    s/p pulmonary vein isolation by cryoablation 10/04/15 Dr. Minna Merritts in [name]   PEA (Pulseless electrical activity) [facility])    ~ 01/13/21 at [name] during admission DKA, volume overload, possible CAP, hypoxia s/p ACLS with ROSC ~ 10-15   Pneumonia    Primary insomnia    Type 2 diabetes mellitus with diabetic nephropathy ([place])     Past Surgical History:  Procedure Laterality  Date   ABDOMINAL HYSTERECTOMY     Partial- Still has both ovaries   CARDIOVASCULAR STRESS TEST  08/13/2014   Nuclear; Normal   CARPAL TUNNEL RELEASE     CATARACT EXTRACTION, BILATERAL     CESAREAN SECTION     CHOLECYSTECTOMY     CORONARY ANGIOPLASTY WITH STENT PLACEMENT     3 blockages/ 1 stent   CORONARY STENT INTERVENTION N/A 09/29/2020   Procedure: CORONARY STENT INTERVENTION;  Surgeon: Sherren Mocha, MD;  Location: [place] [facility];  Service: Cardiovascular;  Laterality: N/A;  CFX   EYE SURGERY     LEFT HEART CATH AND CORONARY ANGIOGRAPHY N/A 09/29/2020   Procedure: LEFT HEART CATH AND CORONARY ANGIOGRAPHY;  Surgeon: Sherren Mocha, MD;  Location: [place] [facility];  Service: Cardiovascular;  Laterality: N/A;   MITRAL VALVE REPAIR N/A 05/18/2021   Procedure: MITRAL VALVE REPAIR;  Surgeon: Early Osmond, MD;  Location: [place] [facility];  Service: Cardiovascular;  Laterality: N/A;   RIGHT HEART CATH N/A 04/27/2021   Procedure: RIGHT HEART CATH;  Surgeon: Jolaine Artist, MD;  Location: [place] [facility];  Service: Cardiovascular;  Laterality: N/A;   RIGHT/LEFT HEART CATH AND CORONARY ANGIOGRAPHY N/A 01/07/2017   Procedure: Right/Left Heart Cath and Coronary Angiography;  Surgeon: Larey Dresser, MD;  Location: [place] [facility];  Service: Cardiovascular;  Laterality: N/A;   ROTATOR CUFF REPAIR     TEE WITHOUT CARDIOVERSION N/A 04/28/2021   Procedure: TRANSESOPHAGEAL ECHOCARDIOGRAM (TEE);  Surgeon: Jolaine Artist, MD;  Location: [name];  Service: Cardiovascular;  Laterality: N/A;   TEE WITHOUT CARDIOVERSION N/A 05/18/2021   Procedure: TRANSESOPHAGEAL ECHOCARDIOGRAM (TEE);  Surgeon: Early Osmond, MD;  Location: [place] [facility];  Service: Cardiovascular;  Laterality: N/A;   TOE AMPUTATION Left    US ECHOCARDIOGRAPHY  05/2017   Normal    Current Medications: Current Meds  Medication Sig   allopurinol (ZYLOPRIM) 100 MG tablet Take 1 tablet (100 mg  total) by mouth daily.   amoxicillin (AMOXIL) 500 MG capsule TAKE FOUR CAPSULES BY MOUTH ONE HOUR BEFORE DENTAL PROCEDURES AND CLEANINGS   amoxicillin-clavulanate (AUGMENTIN) 875-125 MG tablet Take 1 tablet by mouth 2 (two) times daily for 5 days.   aspirin EC 81 MG EC tablet Take 1 tablet (81 mg total) by mouth daily. Swallow whole.   BD PEN NEEDLE NANO 2ND GEN 32G X 4 MM MISC 4 (four) times daily.   busPIRone (BUSPAR) 5 MG tablet Take 1 tablet (5 mg total) by mouth 3 (three) times daily.   carvedilol (COREG) 12.5 MG tablet Take 1 tablet (12.5 mg total) by mouth 2 (two) times daily.   Cholecalciferol (VITAMIN D) 50 MCG (2000 UT) tablet Take 2,000 Units by mouth daily. Take one daily   clopidogrel (PLAVIX) 75 MG tablet Take 1 tablet (75 mg total) by mouth at bedtime.   Cyanocobalamin 3000 MCG CAPS Take 2 capsules by mouth daily. Patient purchased 3000 mcg gummies and taking 2 gummies daily   EPINEPHrine 0.3 mg/0.3 mL IJ SOAJ injection Use as directed for life-threatening allergic reaction.   ezetimibe (ZETIA) 10 MG tablet Take 1 tablet (10 mg total) by mouth daily.   ferrous sulfate 325 (65 FE) MG tablet Take 325 mg by mouth 2 (two) times daily with a meal.   hydrALAZINE (APRESOLINE) 25 MG tablet TAKE 1 TABLET BY MOUTH THREE TIMES DAILY   HYDROcodone-acetaminophen (NORCO) 7.5-325 MG tablet Take 1 tablet by mouth 3 (three) times daily as needed for moderate pain.   insulin aspart (NOVOLOG) 100 UNIT/ML injection Inject 2-10 Units into the skin 3 (three) times daily before meals.   isosorbide mononitrate (IMDUR) 30 MG 24 hr tablet Take 1 tablet (30 mg total) by mouth daily.   lactulose (CHRONULAC) 10 GM/15ML solution Take 15 mLs (10 g total) by mouth daily as needed for mild constipation.   latanoprost (XALATAN) 0.005 % ophthalmic solution Place 1 drop into both eyes at bedtime.   LEVEMIR FLEXTOUCH 100 UNIT/ML FlexPen Inject 50 Units into the skin daily.   LORazepam (ATIVAN) 0.5 MG tablet Take 1  tablet (0.5 mg total) by mouth daily as needed for anxiety.   magnesium oxide (MAG-OX) 400 MG tablet Take 2 tablets (800 mg total) by mouth daily.  Take 2 (400 mg) daily=800 mg   nitroGLYCERIN (NITROSTAT) 0.4 MG SL tablet Place 1 tablet (0.4 mg total) under the tongue every 5 (five) minutes as needed for chest pain.   Omega-3 Fatty Acids (FISH OIL PO) Take 1 capsule by mouth daily.   OXYGEN Inhale 2 L into the lungs at bedtime.   pantoprazole (PROTONIX) 40 MG tablet Take 1 tablet (40 mg total) by mouth daily.   potassium chloride SA (KLOR-CON) 20 MEQ tablet Take 1 tablet (20 mEq total) by mouth daily.   Probiotic CAPS Take 1 capsule by mouth daily.   promethazine (PHENERGAN) 25 MG tablet Take 1 tablet (25 mg total) by mouth every 6 (six) hours as needed for nausea or vomiting.   torsemide (DEMADEX) 20 MG tablet Take 3 tablets (60 mg total) by mouth daily.     Allergies:   Naproxen, Celecoxib, Aspirin, Glucosamine, Iron, Metoclopramide, Metolazone, Shellfish-derived products, and Tramadol   Social History   Socioeconomic History   Marital status: Married    Spouse name: Not on file   Number of children: Not on file   Years of education: Not on file   Highest education level: Not on file  Occupational History   Not on file  Tobacco Use   Smoking status: Former   Smokeless tobacco: Never  Vaping Use   Vaping Use: Never used  Substance and Sexual Activity   Alcohol use: Not Currently   Drug use: Not Currently   Sexual activity: Not on file  Other Topics Concern   Not on file  Social History Narrative   Not on file   Social Determinants of Health   Financial Resource Strain: Low Risk    Difficulty of Paying Living Expenses: Not very hard  Food Insecurity: No Food Insecurity   Worried About Running Out of Food in the Last Year: Never true   Ran Out of Food in the Last Year: Never true  Transportation Needs: No Transportation Needs   Lack of Transportation (Medical): No   Lack  of Transportation (Non-Medical): No  Physical Activity: Not on file  Stress: Not on file  Social Connections: Not on file     Family History: The patient's family history includes Bipolar disorder in an other family member; Breast cancer in her mother; Coronary artery disease in her mother; Depression in an other family member; Diabetes type II in her mother; Hyperlipidemia in her mother; Hypertension in her father and mother; Lung cancer in her mother; Schizophrenia in an other family member.  ROS:   Please see the history of present illness.    All other systems reviewed and are negative.  EKGs/Labs/Other Studies Reviewed:    The following studies were reviewed today:  Echocardiogram 07/12/21:   1. Left ventricular ejection fraction, by estimation, is 60 to 65%. The  left ventricle has normal function. The left ventricle has no regional  wall motion abnormalities. Left ventricular diastolic parameters are  indeterminate.   2. Right ventricular systolic function is mildly reduced. The right  ventricular size is mildly enlarged. There is mildly elevated pulmonary  artery systolic pressure.   3. Left atrial size was moderately dilated.   4. Patient is s/p mitral clip with XTW between A2/P2. Had second clip NTW  lateral to this. Apparently this clip was only attached to the anterior  leaflet. Desptie this the clips seem in stable position with moderate  residual MR between the clips and  dual mitral inflows on either side suggesting at  least one of the clips  has a good tissue bridge . The mitral valve has been repaired/replaced.  Moderate mitral valve regurgitation. No evidence of mitral stenosis.  Procedure Date: 05/18/2021.   5. The aortic valve is tricuspid. There is mild calcification of the  aortic valve. Aortic valve regurgitation is not visualized. Aortic valve  sclerosis is present, with no evidence of aortic valve stenosis.   6. The inferior vena cava is normal in size  with greater than 50%  respiratory variability, suggesting right atrial pressure of 3 mmHg.   TEER 05/18/21:  MITRAL VALVE REPAIR    Conclusion   1.  Status post transcatheter mitral edge-to-edge repair with placement of 1 XT W clip and 1 NT W clip.  The NT W clip was entangled and had to be deployed in the lateral part of the valve.  No further clip placement could be pursued due to the proximity of both clips.  The final V waves were 52 mmHg with residual moderate to severe mitral regurgitation.  The results were reviewed with Dr. [name].   _____________   Echo 05/19/21: IMPRESSIONS   1. Left ventricular ejection fraction, by estimation, is 50 to 55%. The  left ventricle has low normal function. The left ventricle demonstrates  regional wall motion abnormalities (see scoring diagram/findings for  description). Left ventricular diastolic   function could not be evaluated. Elevated left atrial pressure. There is  severe hypokinesis of the left ventricular, basal-mid inferolateral wall.   2. Right ventricular systolic function is mildly reduced. The right  ventricular size is mildly enlarged. Tricuspid regurgitation signal is  inadequate for assessing PA pressure.   3. Left atrial size was mildly dilated.   4. There are two MitraClips that appear well-seated, stable in position.  residual MR is seen from the central area of the commisure, between the  clips. Images are technically difficult, but overall MR severity is  probably moderate. Pulmonary vein flow  could not be obtained. The mitral valve has been repaired/replaced.  Moderate mitral valve regurgitation. The mean mitral valve gradient is 5.0  mmHg. Procedure Date: 05/18/2021.   5. The aortic valve is tricuspid. Aortic valve regurgitation is not  visualized. No aortic stenosis is present.   6. The inferior vena cava is dilated in size with <50% respiratory  variability, suggesting right atrial pressure of 15 mmHg.    Comparison(s): Images are tehnically difficult. Overall mitral  insufficiency appears mild-to-moderate on images available, but is  probably at least moderate.   EKG:  EKG is not ordered today.    Recent Labs: 10/12/2020: NT-Pro BNP 3,407 04/12/2021: B Natriuretic Peptide 353.3 04/24/2021: TSH 4.014 05/25/2021: Magnesium 1.9 07/11/2021: ALT 9; BUN 57; Creatinine, Ser 2.32; Hemoglobin 8.5; Platelets 238; Potassium 4.8; Sodium 140  Recent Lipid Panel    Component Value Date/Time   CHOL 145 07/11/2021 0811   TRIG 92 07/11/2021 0811   HDL 48 07/11/2021 0811   CHOLHDL 3.0 07/11/2021 0811   CHOLHDL 2.4 09/30/2020 0241   VLDL 15 09/30/2020 0241   LDLCALC 80 07/11/2021 0811   Physical Exam:    VS:  BP (!) 100/42    Pulse 72    Ht 5' 4.5" (1.638 m)    Wt 227 lb (103 kg)    SpO2 98%    BMI 38.36 kg/m     Wt Readings from Last 3 Encounters:  07/12/21 227 lb (103 kg)  07/12/21 227 lb 12.8 oz (103.3 kg)  07/11/21 226 lb (  102.5 kg)    General: Well developed, well nourished, NAD Neck: Negative for carotid bruits. No JVD Lungs:Clear to ausculation bilaterally. No wheezes, rales, or rhonchi. Breathing is unlabored. Cardiovascular: RRR with S1 S2. Soft murmur Extremities: No edema. Neuro: Alert and oriented. No focal deficits. No facial asymmetry. MAE spontaneously. Psych: Responds to questions appropriately with normal affect.    ASSESSMENT/PLAN:    Severe MR s/p TEER: Doing much better today than 1 week post Mitraclip. She is able to tolerate more activity and reports that she has restarted CR three days per week without issues. She has NYHA class II symptoms. Echocardiogram today with normal LV function s/p mitral clip with XTW between A2/P2. Had second clip NTW  lateral to this however only attached to anterior leaflet. Desptie this the clips seem in stable position with moderate residual MR with dual mitral inflows suggesting at least one of the clips has a good tissue bridge. There  is no evidence of mitral stenosis. Continued on DAPT with aspirin and Plavix with CAD and DES/PCI 3/22>>ideally should continue for 12 months. SBE with amoxicillin 2g PO one hour prior to dental procedures.     Acute on chronic anemia: HB at 8.5 per CBC from appointment with PCP 07/11/21. Appears to be at most recent baseline.     Chronic diastolic CHF: Follow very closely with AHF clinic, last seen earlier today. Her weight is stable and she appears euvolemic on exam. No changes needed at this time.    CKD stage IV: Creatinine at 2.32 from 07/11/21 which is around baseline of 2.4   CAD: Continue on statin and. DAPT. Will need antiplatelet therapy for at least a year (09/2021) given PCI in the setting of NSTEMI.    PAF: s/p remote ablation in [place]. No reports of palpitations today. Prior ZIO from 8/22 showed mostly SB/SR, 66 SVT runs, frequent isolated SVE (11.2%) and no AF. Continue current regimen with carvedilol 12.5mg  BID. No oral anticoagulation in the setting of GI bleeding, need for DAPT and no recent AF recurrence.  Medication Adjustments/Labs and Tests Ordered: Current medicines are reviewed at length with the patient today.  Concerns regarding medicines are outlined above.  No orders of the defined types were placed in this encounter.  No orders of the defined types were placed in this encounter.   Patient Instructions  Medication Instructions:  Your physician recommends that you continue on your current medications as directed. Please refer to the Current Medication list given to you today.  *If you need a refill on your cardiac medications before your next appointment, please call your pharmacy*   Lab Work: NONE If you have labs (blood work) drawn today and your tests are completely normal, you will receive your results only by: [facility]iva (if you have MyChart) OR A paper copy in the mail If you have any lab test that is abnormal or we need to change your treatment, we  will call you to review the results.   Testing/Procedures: NONE   Follow-Up: At [facility], you and your health needs are our priority.  As part of our continuing mission to provide you with exceptional heart care, we have created designated Provider Care Teams.  These Care Teams include your primary Cardiologist (physician) and Advanced Practice Providers (APPs -  Physician Assistants and Nurse Practitioners) who all work together to provide you with the care you need, when you need it.  We recommend signing up for the patient portal called "MyChart".  Sign up information is provided on this After Visit Summary.  MyChart is used to connect with patients for Virtual Visits (Telemedicine).  Patients are able to view lab/test results, encounter notes, upcoming appointments, etc.  Non-urgent messages can be sent to your provider as well.   To learn more about what you can do with MyChart, go to [email or web address].    Your next appointment:   STRUCTURAL TEAM WILL CALL YOU TO SCHEDULE A FOLLOW-UP   Signed, Kathyrn Drown, NP  07/12/2021 3:33 PM    [facility]

## 2021-07-11 ENCOUNTER — Ambulatory Visit (INDEPENDENT_AMBULATORY_CARE_PROVIDER_SITE_OTHER): Payer: HMO | Admitting: Family Medicine

## 2021-07-11 ENCOUNTER — Other Ambulatory Visit: Payer: Self-pay

## 2021-07-11 VITALS — BP 120/50 | HR 78 | Temp 97.6°F | Resp 18 | Ht 64.5 in | Wt 226.0 lb

## 2021-07-11 DIAGNOSIS — Z9989 Dependence on other enabling machines and devices: Secondary | ICD-10-CM

## 2021-07-11 DIAGNOSIS — G4733 Obstructive sleep apnea (adult) (pediatric): Secondary | ICD-10-CM | POA: Diagnosis not present

## 2021-07-11 DIAGNOSIS — M545 Low back pain, unspecified: Secondary | ICD-10-CM

## 2021-07-11 DIAGNOSIS — I130 Hypertensive heart and chronic kidney disease with heart failure and stage 1 through stage 4 chronic kidney disease, or unspecified chronic kidney disease: Secondary | ICD-10-CM

## 2021-07-11 DIAGNOSIS — E1142 Type 2 diabetes mellitus with diabetic polyneuropathy: Secondary | ICD-10-CM

## 2021-07-11 DIAGNOSIS — N184 Chronic kidney disease, stage 4 (severe): Secondary | ICD-10-CM

## 2021-07-11 DIAGNOSIS — T466X5A Adverse effect of antihyperlipidemic and antiarteriosclerotic drugs, initial encounter: Secondary | ICD-10-CM

## 2021-07-11 DIAGNOSIS — K219 Gastro-esophageal reflux disease without esophagitis: Secondary | ICD-10-CM | POA: Diagnosis not present

## 2021-07-11 DIAGNOSIS — N3000 Acute cystitis without hematuria: Secondary | ICD-10-CM

## 2021-07-11 DIAGNOSIS — G894 Chronic pain syndrome: Secondary | ICD-10-CM

## 2021-07-11 DIAGNOSIS — D649 Anemia, unspecified: Secondary | ICD-10-CM | POA: Diagnosis not present

## 2021-07-11 DIAGNOSIS — I2510 Atherosclerotic heart disease of native coronary artery without angina pectoris: Secondary | ICD-10-CM

## 2021-07-11 DIAGNOSIS — J9611 Chronic respiratory failure with hypoxia: Secondary | ICD-10-CM

## 2021-07-11 DIAGNOSIS — I340 Nonrheumatic mitral (valve) insufficiency: Secondary | ICD-10-CM

## 2021-07-11 DIAGNOSIS — N390 Urinary tract infection, site not specified: Secondary | ICD-10-CM | POA: Insufficient documentation

## 2021-07-11 DIAGNOSIS — E1121 Type 2 diabetes mellitus with diabetic nephropathy: Secondary | ICD-10-CM

## 2021-07-11 DIAGNOSIS — G720 Drug-induced myopathy: Secondary | ICD-10-CM

## 2021-07-11 DIAGNOSIS — D631 Anemia in chronic kidney disease: Secondary | ICD-10-CM

## 2021-07-11 DIAGNOSIS — E782 Mixed hyperlipidemia: Secondary | ICD-10-CM

## 2021-07-11 DIAGNOSIS — F411 Generalized anxiety disorder: Secondary | ICD-10-CM

## 2021-07-11 LAB — POCT URINALYSIS DIPSTICK
Bilirubin, UA: NEGATIVE
Blood, UA: NEGATIVE
Glucose, UA: NEGATIVE
Ketones, UA: NEGATIVE
Nitrite, UA: NEGATIVE
Protein, UA: POSITIVE — AB
Spec Grav, UA: 1.015 (ref 1.010–1.025)
Urobilinogen, UA: 0.2 E.U./dL
pH, UA: 6.5 (ref 5.0–8.0)

## 2021-07-11 MED ORDER — BUSPIRONE HCL 5 MG PO TABS: 5.0000 mg | ORAL_TABLET | Freq: Three times a day (TID) | ORAL | 2 refills | Status: DC

## 2021-07-11 MED ORDER — AMOXICILLIN-POT CLAVULANATE 875-125 MG PO TABS: 1.0000 | ORAL_TABLET | Freq: Two times a day (BID) | ORAL | 0 refills | Status: AC

## 2021-07-11 NOTE — Progress Notes (Signed)
Subjective:  Patient ID: Dana [name], female    DOB: [birth year]  Age: 60 y.o. MRN: [id number]  Chief Complaint  Patient presents with   Diabetes    HPI Diabetes:  Complications: neuropathy, nephropathy, heart disease, CKD. Glucose checking: has a cgm Hypoglycemia: no Most recent A1C: mid 6s. Current medications: levemir 40 U daily., novolog 2-10 U qac.  Last Eye Exam: 02/2021 Foot checks: daily. Sees Dr. Tamala Julian  Hyperlipidemia/CAD: Current medications: off repatha 140 mg for 2 months. Taking fish oil one daily and zetia 10 mg once daily. Intolerant to statins  LHC 09/2020 1. 3 vessel CAD with moderate mid-LAD and mid-RCA stenoses, and severe mid-LCx stenosis 2. Successful PCI of the mid-circumflex, reducing severe 90% stenosis to 0% with a 2.75x22 mm Resolute Onyx DES, provisional side branch angioplasty of the OM1 with a 2.5 mm balloon through the stent struts 3. Moderately elevated LVEDP  Recommended at least 12 months DAPT with ASA and plavix, favor long-term if tolerated  Hypertension: Complications: CKD, CAD Current medications: imdur 30 mg daily. Coreg 12.5 mg bid, plavix and aspirin. Torsemide 20 mg 3 daily. Kcl 20 meq one daily, hydralzzine 25 mg one tid. Macnesium oxide 400 mg once daily  Diet: health Exercise: cardiopulmonary rehab  Respiratory failure uses continue oxygen at night and cpap for OSA  Glaucoma: on drops.   Back pain: flared up. No radiculopathy.   Current Outpatient Medications on File Prior to Visit  Medication Sig Dispense Refill   allopurinol (ZYLOPRIM) 100 MG tablet Take 1 tablet (100 mg total) by mouth daily. 90 tablet 0   aspirin EC 81 MG EC tablet Take 1 tablet (81 mg total) by mouth daily. Swallow whole. 90 tablet 3   BD PEN NEEDLE NANO 2ND GEN 32G X 4 MM MISC 4 (four) times daily.     carvedilol (COREG) 12.5 MG tablet Take 1 tablet (12.5 mg total) by mouth 2 (two) times daily. 60 tablet 11   Cholecalciferol (VITAMIN D) 50 MCG (2000  UT) tablet Take 2,000 Units by mouth daily. Take one daily     clopidogrel (PLAVIX) 75 MG tablet Take 1 tablet (75 mg total) by mouth at bedtime. 90 tablet 3   Cyanocobalamin 3000 MCG CAPS Take 2 capsules by mouth daily. Patient purchased 3000 mcg gummies and taking 2 gummies daily     EPINEPHrine 0.3 mg/0.3 mL IJ SOAJ injection Use as directed for life-threatening allergic reaction. 4 Device 3   ezetimibe (ZETIA) 10 MG tablet Take 1 tablet (10 mg total) by mouth daily. 90 tablet 2   ferrous sulfate 325 (65 FE) MG tablet Take 325 mg by mouth 2 (two) times daily with a meal.     hydrALAZINE (APRESOLINE) 25 MG tablet TAKE 1 TABLET BY MOUTH THREE TIMES DAILY 90 tablet 3   HYDROcodone-acetaminophen (NORCO) 7.5-325 MG tablet Take 1 tablet by mouth 3 (three) times daily as needed for moderate pain. 90 tablet 0   insulin aspart (NOVOLOG) 100 UNIT/ML injection Inject 2-10 Units into the skin 3 (three) times daily before meals.     isosorbide mononitrate (IMDUR) 30 MG 24 hr tablet Take 1 tablet (30 mg total) by mouth daily. 90 tablet 3   lactulose (CHRONULAC) 10 GM/15ML solution Take 15 mLs (10 g total) by mouth daily as needed for mild constipation. 236 mL 0   latanoprost (XALATAN) 0.005 % ophthalmic solution Place 1 drop into both eyes at bedtime.     LEVEMIR FLEXTOUCH 100 UNIT/ML FlexPen Inject  50 Units into the skin daily.     LORazepam (ATIVAN) 0.5 MG tablet Take 1 tablet (0.5 mg total) by mouth daily as needed for anxiety. 30 tablet 2   magnesium oxide (MAG-OX) 400 MG tablet Take 2 tablets (800 mg total) by mouth daily. Take 2 (400 mg) daily=800 mg 60 tablet 1   nitroGLYCERIN (NITROSTAT) 0.4 MG SL tablet Place 1 tablet (0.4 mg total) under the tongue every 5 (five) minutes as needed for chest pain. 25 tablet 3   Omega-3 Fatty Acids (FISH OIL PO) Take 1 capsule by mouth daily.     OXYGEN Inhale 2 L into the lungs at bedtime.     pantoprazole (PROTONIX) 40 MG tablet Take 1 tablet (40 mg total) by mouth  daily. 90 tablet 0   potassium chloride SA (KLOR-CON) 20 MEQ tablet Take 1 tablet (20 mEq total) by mouth daily. 90 tablet 3   Probiotic CAPS Take 1 capsule by mouth daily.     promethazine (PHENERGAN) 25 MG tablet Take 1 tablet (25 mg total) by mouth every 6 (six) hours as needed for nausea or vomiting. 90 tablet 1   torsemide (DEMADEX) 20 MG tablet Take 3 tablets (60 mg total) by mouth daily. 90 tablet 3   No current facility-administered medications on file prior to visit.   Past Medical History:  Diagnosis Date   Acquired absence of left great toe ([place])    Acquired absence of other left toe(s) ([place])    ACS (acute coronary syndrome) (HCC)    Anemia    Anxiety    CAD (coronary artery disease)    DES mLAD 04/07/15; DES dRCA & DES pPDA 08/30/16; DES mCX & PTCA OM1 09/29/20   Chronic diastolic (congestive) heart failure (HCC)    Chronic systolic (congestive) heart failure (HCC)    CKD (chronic kidney disease)    Contrast dye induced nephropathy, possible 09/03/2020   Diabetes (HCC)    Diastolic CHF (HCC)    Dyspnea    Gastro-esophageal reflux disease without esophagitis    Hyperlipidemia    Hypertension    Hypertensive heart disease with heart failure (HCC)    Hypoxemia    Idiopathic gout, multiple sites    Localized edema    Low back pain    Mitral regurgitation    Mixed hyperlipidemia    Mixed incontinence    Morbid (severe) obesity due to excess calories (HCC)    Neuromuscular disorder (HCC)    Neuropathy    Non-ST elevation (NSTEMI) myocardial infarction ([place])    08/29/20, 09/29/20   OSA (obstructive sleep apnea) 09/03/2020   Other specified hypothyroidism    PAF (paroxysmal atrial fibrillation) (HCC)    s/p pulmonary vein isolation by cryoablation 10/04/15 Dr. Minna Merritts in [facility]   PEA (Pulseless electrical activity) [facility])    ~ 01/13/21 at [facility] during admission DKA, volume overload, possible CAP, hypoxia s/p ACLS with ROSC ~ 10-15   Pneumonia    Primary insomnia     Type 2 diabetes mellitus with diabetic nephropathy ([place])    Past Surgical History:  Procedure Laterality Date   ABDOMINAL HYSTERECTOMY     Partial- Still has both ovaries   CARDIOVASCULAR STRESS TEST  08/13/2014   Nuclear; Normal   CARPAL TUNNEL RELEASE     CATARACT EXTRACTION, BILATERAL     CESAREAN SECTION     CHOLECYSTECTOMY     CORONARY ANGIOPLASTY WITH STENT PLACEMENT     3 blockages/ 1 stent   CORONARY  STENT INTERVENTION N/A 09/29/2020   Procedure: CORONARY STENT INTERVENTION;  Surgeon: Sherren Mocha, MD;  Location: [place] [facility];  Service: Cardiovascular;  Laterality: N/A;  CFX   EYE SURGERY     LEFT HEART CATH AND CORONARY ANGIOGRAPHY N/A 09/29/2020   Procedure: LEFT HEART CATH AND CORONARY ANGIOGRAPHY;  Surgeon: Sherren Mocha, MD;  Location: [place] [facility];  Service: Cardiovascular;  Laterality: N/A;   MITRAL VALVE REPAIR N/A 05/18/2021   Procedure: MITRAL VALVE REPAIR;  Surgeon: Early Osmond, MD;  Location: [place] [facility];  Service: Cardiovascular;  Laterality: N/A;   RIGHT HEART CATH N/A 04/27/2021   Procedure: RIGHT HEART CATH;  Surgeon: Jolaine Artist, MD;  Location: [place] [facility];  Service: Cardiovascular;  Laterality: N/A;   RIGHT/LEFT HEART CATH AND CORONARY ANGIOGRAPHY N/A 01/07/2017   Procedure: Right/Left Heart Cath and Coronary Angiography;  Surgeon: Larey Dresser, MD;  Location: [place] [facility];  Service: Cardiovascular;  Laterality: N/A;   ROTATOR CUFF REPAIR     TEE WITHOUT CARDIOVERSION N/A 04/28/2021   Procedure: TRANSESOPHAGEAL ECHOCARDIOGRAM (TEE);  Surgeon: Jolaine Artist, MD;  Location: [facility] ENDOSCOPY;  Service: Cardiovascular;  Laterality: N/A;   TEE WITHOUT CARDIOVERSION N/A 05/18/2021   Procedure: TRANSESOPHAGEAL ECHOCARDIOGRAM (TEE);  Surgeon: Early Osmond, MD;  Location: [place] [facility];  Service: Cardiovascular;  Laterality: N/A;   TOE AMPUTATION Left    US ECHOCARDIOGRAPHY  05/2017   Normal    Family  History  Problem Relation Age of Onset   Hypertension Father    Coronary artery disease Mother    Hypertension Mother    Hyperlipidemia Mother    Diabetes type II Mother    Breast cancer Mother    Lung cancer Mother    Bipolar disorder Other    Depression Other    Schizophrenia Other    Social History   Socioeconomic History   Marital status: Married    Spouse name: Not on file   Number of children: Not on file   Years of education: Not on file   Highest education level: Not on file  Occupational History   Not on file  Tobacco Use   Smoking status: Former   Smokeless tobacco: Never  Vaping Use   Vaping Use: Never used  Substance and Sexual Activity   Alcohol use: Not Currently   Drug use: Not Currently   Sexual activity: Not on file  Other Topics Concern   Not on file  Social History Narrative   Not on file   Social Determinants of Health   Financial Resource Strain: Low Risk    Difficulty of Paying Living Expenses: Not very hard  Food Insecurity: No Food Insecurity   Worried About Running Out of Food in the Last Year: Never true   Ran Out of Food in the Last Year: Never true  Transportation Needs: No Transportation Needs   Lack of Transportation (Medical): No   Lack of Transportation (Non-Medical): No  Physical Activity: Not on file  Stress: Not on file  Social Connections: Not on file    Review of Systems  Constitutional:  Negative for chills, fatigue and fever.  HENT:  Negative for congestion, rhinorrhea and sore throat.   Respiratory:  Positive for shortness of breath (baseline). Negative for cough.   Cardiovascular:  Positive for palpitations (svt). Negative for chest pain.  Gastrointestinal:  Negative for abdominal pain, constipation, diarrhea, nausea and vomiting.  Endocrine: Positive for polydipsia. Negative for  polyphagia and polyuria.  Genitourinary:  Negative for dysuria and urgency.       Cloudy urine with some pressure with voiding.   Musculoskeletal:  Positive for arthralgias, back pain and myalgias (calves).  Neurological:  Negative for dizziness, weakness, light-headedness and headaches.       Balance issues  Psychiatric/Behavioral:  Negative for dysphoric mood. The patient is nervous/anxious.     Objective:  BP (!) 120/50    Pulse 78    Temp 97.6 F (36.4 C)    Resp 18    Ht 5' 4.5" (1.638 m)    Wt 226 lb (102.5 kg)    BMI 38.19 kg/m   BP/Weight 07/12/2021 07/12/2021 81/15/7262  Systolic BP 035 597 416  Diastolic BP 54 42 50  Wt. (Lbs) 227.8 227 226  BMI 38.5 38.36 38.19    Physical Exam Vitals reviewed.  Constitutional:      Appearance: Normal appearance. She is obese.  Neck:     Vascular: No carotid bruit.  Cardiovascular:     Rate and Rhythm: Normal rate and regular rhythm.     Heart sounds: Normal heart sounds.  Pulmonary:     Effort: Pulmonary effort is normal. No respiratory distress.     Breath sounds: Normal breath sounds.  Abdominal:     General: Abdomen is flat. Bowel sounds are normal.     Palpations: Abdomen is soft.     Tenderness: There is no abdominal tenderness.  Musculoskeletal:     Right lower leg: Edema present.     Left lower leg: Edema present.  Neurological:     Mental Status: She is alert and oriented to person, place, and time.  Psychiatric:        Mood and Affect: Mood normal.        Behavior: Behavior normal.    Diabetic Foot Exam - Simple   Simple Foot Form Diabetic Foot exam was performed with the following findings: Yes 07/11/2021  8:08 AM  Visual Inspection Sensation Testing Pulse Check Comments Pt has compression stockings on. Pt has foot exam every 3 months with Dr Tamala Julian      Lab Results  Component Value Date   WBC 10.4 07/11/2021   HGB 8.5 (L) 07/11/2021   HCT 27.1 (L) 07/11/2021   PLT 238 07/11/2021   GLUCOSE 96 07/11/2021   CHOL 145 07/11/2021   TRIG 92 07/11/2021   HDL 48 07/11/2021   LDLCALC 80 07/11/2021   ALT 9 07/11/2021   AST 15  07/11/2021   NA 140 07/11/2021   K 4.8 07/11/2021   CL 101 07/11/2021   CREATININE 2.32 (H) 07/11/2021   BUN 57 (H) 07/11/2021   CO2 24 07/11/2021   TSH 4.014 04/24/2021   INR 1.1 05/16/2021   HGBA1C 6.6 (H) 04/25/2021      Assessment & Plan:   Problem List Items Addressed This Visit       Cardiovascular and Mediastinum   Hypertensive heart and renal disease with congestive heart failure ([place]) - Primary    The current medical regimen is effective;  continue present plan and medications. Management per specialist.       Severe mitral regurgitation    S/p MV repair 05/2021. Doing well.       Coronary artery disease involving native coronary artery of native heart without angina pectoris     Respiratory   Chronic respiratory failure with hypoxia (HCC)    CONTINUE CARDIOPULMONARY REHAB.  Continue oxygen at 2 L at  night.       OSA on CPAP    Continue cpap with oxygen        Digestive   GERD without esophagitis    The current medical regimen is effective;  continue present plan and medications.        Endocrine   Diabetic polyneuropathy associated with type 2 diabetes mellitus ([place])    Management per specialist. Control: Well controlled. Recommend check sugars fasting daily. Recommend check feet daily. Recommend annual eye exams. Medicines: No changes Continue to work on eating a healthy diet and exercise.  Labs drawn today.        Relevant Medications   busPIRone (BUSPAR) 5 MG tablet   Diabetic nephropathy associated with type 2 diabetes mellitus ([place])    Management per specialist. At goal. The current medical regimen is effective;  continue present plan and medications. Continue use of CGM.        Musculoskeletal and Integument   Statin myopathy    Intolerant to statins        Genitourinary   UTI (urinary tract infection)    BLADDER INFECTION: SENT AUGMENTIN BASED ON LAST CULTURE SENSITIVITY.  Ordered urine culture and UA.      Relevant  Medications   amoxicillin-clavulanate (AUGMENTIN) 875-125 MG tablet   Other Relevant Orders   POCT urinalysis dipstick (Completed)   Urine Culture (Completed)     Other   Chronic pain syndrome    The current medical regimen is fairly effective;  continue present plan and medications.      Lumbar back pain    Continue hydrocodone/apap tid prn severe back pain. Unable to use nsaids.       Anemia due to stage 4 chronic kidney disease ([place])    Follow up with nephrology.       Relevant Orders   CBC with Differential/Platelet (Completed)   GAD (generalized anxiety disorder)    ANXIETY: START ON BUSPIRONE 5 MG ONE THREE TIMES A DAY. CALL BACK IF NOT HELPING ENOUGH. RECOMMEND COUNSELING.       Relevant Medications   busPIRone (BUSPAR) 5 MG tablet   Mixed hyperlipidemia    HEART DISEASE AND HIGH CHOLESTEROL: CALL DR. HILTY'S OFFICE FOR REPATHA. Recommend continue to work on eating healthy diet and exercise. Intolerant to statins      Relevant Orders   Comprehensive metabolic panel (Completed)   Lipid panel (Completed)   Anemia   Relevant Orders   CBC with Differential/Platelet (Completed)  .  Meds ordered this encounter  Medications   busPIRone (BUSPAR) 5 MG tablet    Sig: Take 1 tablet (5 mg total) by mouth 3 (three) times daily.    Dispense:  90 tablet    Refill:  2   amoxicillin-clavulanate (AUGMENTIN) 875-125 MG tablet    Sig: Take 1 tablet by mouth 2 (two) times daily for 5 days.    Dispense:  10 tablet    Refill:  0    Orders Placed This Encounter  Procedures   Urine Culture   Comprehensive metabolic panel   CBC with Differential/Platelet   Lipid panel   POCT urinalysis dipstick     Follow-up: Return in about 3 months (around 10/09/2021) for chronic fasting.  I,Kalix Meinecke,acting as a Education administrator for [name], MD.,have documented all relevant documentation on the behalf of [name], MD,as directed by  [name], MD while in the presence of [name],  MD.   An After Visit Summary was printed and given  to the patient.  [name], MD [facility] [phone]

## 2021-07-11 NOTE — Patient Instructions (Addendum)
ANXIETY: START ON BUSPIRONE 5 MG ONE THREE TIMES A DAY. CALL BACK IF NOT HELPING ENOUGH. RECOMMEND COUNSELING.  CONTINUE CARDIOPULMONARY REHAB.   HEART DISEASE AND HIGH CHOLESTEROL: CALL DR. HILTY'S OFFICE FOR REPATHA.  BLADDER INFECTION: SENT AUGMENTIN BASED ON LAST CULTURE SENSITIVITY.

## 2021-07-12 ENCOUNTER — Encounter (HOSPITAL_COMMUNITY): Payer: Self-pay

## 2021-07-12 ENCOUNTER — Ambulatory Visit (HOSPITAL_COMMUNITY)
Admission: RE | Admit: 2021-07-12 | Discharge: 2021-07-12 | Disposition: A | Discharge status: Home / Self Care | Payer: HMO | Source: Ambulatory Visit | Attending: Family Medicine | Admitting: Family Medicine

## 2021-07-12 ENCOUNTER — Encounter: Payer: Self-pay | Admitting: Cardiology

## 2021-07-12 ENCOUNTER — Encounter: Payer: Self-pay | Admitting: Family Medicine

## 2021-07-12 ENCOUNTER — Ambulatory Visit (HOSPITAL_BASED_OUTPATIENT_CLINIC_OR_DEPARTMENT_OTHER): Payer: HMO

## 2021-07-12 ENCOUNTER — Ambulatory Visit (INDEPENDENT_AMBULATORY_CARE_PROVIDER_SITE_OTHER): Payer: HMO | Admitting: Cardiology

## 2021-07-12 VITALS — BP 106/54 | HR 73 | Wt 227.8 lb

## 2021-07-12 VITALS — BP 100/42 | HR 72 | Ht 64.5 in | Wt 227.0 lb

## 2021-07-12 DIAGNOSIS — I252 Old myocardial infarction: Secondary | ICD-10-CM | POA: Diagnosis not present

## 2021-07-12 DIAGNOSIS — Z9889 Other specified postprocedural states: Secondary | ICD-10-CM | POA: Diagnosis not present

## 2021-07-12 DIAGNOSIS — N1832 Chronic kidney disease, stage 3b: Secondary | ICD-10-CM

## 2021-07-12 DIAGNOSIS — I2510 Atherosclerotic heart disease of native coronary artery without angina pectoris: Secondary | ICD-10-CM | POA: Diagnosis not present

## 2021-07-12 DIAGNOSIS — E1022 Type 1 diabetes mellitus with diabetic chronic kidney disease: Secondary | ICD-10-CM | POA: Insufficient documentation

## 2021-07-12 DIAGNOSIS — E1051 Type 1 diabetes mellitus with diabetic peripheral angiopathy without gangrene: Secondary | ICD-10-CM | POA: Diagnosis not present

## 2021-07-12 DIAGNOSIS — D509 Iron deficiency anemia, unspecified: Secondary | ICD-10-CM

## 2021-07-12 DIAGNOSIS — Z87891 Personal history of nicotine dependence: Secondary | ICD-10-CM | POA: Diagnosis not present

## 2021-07-12 DIAGNOSIS — I340 Nonrheumatic mitral (valve) insufficiency: Secondary | ICD-10-CM | POA: Insufficient documentation

## 2021-07-12 DIAGNOSIS — I5032 Chronic diastolic (congestive) heart failure: Secondary | ICD-10-CM

## 2021-07-12 DIAGNOSIS — E785 Hyperlipidemia, unspecified: Secondary | ICD-10-CM | POA: Diagnosis not present

## 2021-07-12 DIAGNOSIS — N184 Chronic kidney disease, stage 4 (severe): Secondary | ICD-10-CM | POA: Diagnosis not present

## 2021-07-12 DIAGNOSIS — Z95818 Presence of other cardiac implants and grafts: Secondary | ICD-10-CM | POA: Diagnosis not present

## 2021-07-12 DIAGNOSIS — N390 Urinary tract infection, site not specified: Secondary | ICD-10-CM | POA: Insufficient documentation

## 2021-07-12 DIAGNOSIS — D649 Anemia, unspecified: Secondary | ICD-10-CM | POA: Diagnosis not present

## 2021-07-12 DIAGNOSIS — I130 Hypertensive heart and chronic kidney disease with heart failure and stage 1 through stage 4 chronic kidney disease, or unspecified chronic kidney disease: Secondary | ICD-10-CM | POA: Diagnosis not present

## 2021-07-12 DIAGNOSIS — G4733 Obstructive sleep apnea (adult) (pediatric): Secondary | ICD-10-CM | POA: Diagnosis not present

## 2021-07-12 DIAGNOSIS — Z955 Presence of coronary angioplasty implant and graft: Secondary | ICD-10-CM | POA: Insufficient documentation

## 2021-07-12 DIAGNOSIS — R002 Palpitations: Secondary | ICD-10-CM

## 2021-07-12 LAB — CBC WITH DIFFERENTIAL/PLATELET
Basophils Absolute: 0.1 10*3/uL (ref 0.0–0.2)
Basos: 1 %
EOS (ABSOLUTE): 0.4 10*3/uL (ref 0.0–0.4)
Eos: 4 %
Hematocrit: 27.1 % — ABNORMAL LOW (ref 34.0–46.6)
Hemoglobin: 8.5 g/dL — ABNORMAL LOW (ref 11.1–15.9)
Immature Grans (Abs): 0 10*3/uL (ref 0.0–0.1)
Immature Granulocytes: 0 %
Lymphocytes Absolute: 2.4 10*3/uL (ref 0.7–3.1)
Lymphs: 23 %
MCH: 26.2 pg — ABNORMAL LOW (ref 26.6–33.0)
MCHC: 31.4 g/dL — ABNORMAL LOW (ref 31.5–35.7)
MCV: 84 fL (ref 79–97)
Monocytes Absolute: 0.8 10*3/uL (ref 0.1–0.9)
Monocytes: 8 %
Neutrophils Absolute: 6.7 10*3/uL (ref 1.4–7.0)
Neutrophils: 64 %
Platelets: 238 10*3/uL (ref 150–450)
RBC: 3.24 x10E6/uL — ABNORMAL LOW (ref 3.77–5.28)
RDW: 14.7 % (ref 11.7–15.4)
WBC: 10.4 10*3/uL (ref 3.4–10.8)

## 2021-07-12 LAB — COMPREHENSIVE METABOLIC PANEL
ALT: 9 IU/L (ref 0–32)
AST: 15 IU/L (ref 0–40)
Albumin/Globulin Ratio: 1.5 (ref 1.2–2.2)
Albumin: 4.2 g/dL (ref 3.8–4.9)
Alkaline Phosphatase: 137 IU/L — ABNORMAL HIGH (ref 44–121)
BUN/Creatinine Ratio: 25 — ABNORMAL HIGH (ref 9–23)
BUN: 57 mg/dL — ABNORMAL HIGH (ref 6–24)
Bilirubin Total: 0.4 mg/dL (ref 0.0–1.2)
CO2: 24 mmol/L (ref 20–29)
Calcium: 9.4 mg/dL (ref 8.7–10.2)
Chloride: 101 mmol/L (ref 96–106)
Creatinine, Ser: 2.32 mg/dL — ABNORMAL HIGH (ref 0.57–1.00)
Globulin, Total: 2.8 g/dL (ref 1.5–4.5)
Glucose: 96 mg/dL (ref 70–99)
Potassium: 4.8 mmol/L (ref 3.5–5.2)
Sodium: 140 mmol/L (ref 134–144)
Total Protein: 7 g/dL (ref 6.0–8.5)
eGFR: 24 mL/min/{1.73_m2} — ABNORMAL LOW (ref >59)

## 2021-07-12 LAB — LIPID PANEL
Chol/HDL Ratio: 3 ratio (ref 0.0–4.4)
Cholesterol, Total: 145 mg/dL (ref 100–199)
HDL: 48 mg/dL (ref >39)
LDL Chol Calc (NIH): 80 mg/dL (ref 0–99)
Triglycerides: 92 mg/dL (ref 0–149)
VLDL Cholesterol Cal: 17 mg/dL (ref 5–40)

## 2021-07-12 MED ORDER — PERFLUTREN LIPID MICROSPHERE
1.0000 mL | INTRAVENOUS | Status: AC | PRN
Start: 2021-07-12 — End: 2021-07-12

## 2021-07-12 NOTE — Patient Instructions (Signed)
Medication Instructions:  Your physician recommends that you continue on your current medications as directed. Please refer to the Current Medication list given to you today.  *If you need a refill on your cardiac medications before your next appointment, please call your pharmacy*   Lab Work: NONE If you have labs (blood work) drawn today and your tests are completely normal, you will receive your results only by: [place] (if you have MyChart) OR A paper copy in the mail If you have any lab test that is abnormal or we need to change your treatment, we will call you to review the results.   Testing/Procedures: NONE   Follow-Up: At [facility], you and your health needs are our priority.  As part of our continuing mission to provide you with exceptional heart care, we have created designated Provider Care Teams.  These Care Teams include your primary Cardiologist (physician) and Advanced Practice Providers (APPs -  Physician Assistants and Nurse Practitioners) who all work together to provide you with the care you need, when you need it.  We recommend signing up for the patient portal called "MyChart".  Sign up information is provided on this After Visit Summary.  MyChart is used to connect with patients for Virtual Visits (Telemedicine).  Patients are able to view lab/test results, encounter notes, upcoming appointments, etc.  Non-urgent messages can be sent to your provider as well.   To learn more about what you can do with MyChart, go to [email or web address].    Your next appointment:   STRUCTURAL TEAM WILL CALL YOU TO SCHEDULE A FOLLOW-UP

## 2021-07-12 NOTE — Patient Instructions (Signed)
It was great to see you today! No medication changes are needed at this time.   Keep follow up as scheduled  Do the following things EVERYDAY: Weigh yourself in the morning before breakfast. Write it down and keep it in a log. Take your medicines as prescribed Eat low salt foods--Limit salt (sodium) to 2000 mg per day.  Stay as active as you can everyday Limit all fluids for the day to less than 2 liters  At the [facility], you and your health needs are our priority. As part of our continuing mission to provide you with exceptional heart care, we have created designated Provider Care Teams. These Care Teams include your primary Cardiologist (physician) and Advanced Practice Providers (APPs- Physician Assistants and Nurse Practitioners) who all work together to provide you with the care you need, when you need it.   You may see any of the following providers on your designated Care Team at your next follow up: Dr [name] Dr Haynes Kerns, NP Lyda Jester, [place] [facility] [place], [place] Audry Riles, PharmD   Please be sure to bring in all your medications bottles to every appointment.   If you have any questions or concerns before your next appointment please send [name] a message through [place] or call our office at [phone].    TO LEAVE A MESSAGE FOR THE NURSE SELECT OPTION 2, PLEASE LEAVE A MESSAGE INCLUDING: YOUR NAME DATE OF BIRTH CALL BACK NUMBER REASON FOR CALL**this is important as we prioritize the call backs  YOU WILL RECEIVE A CALL BACK THE SAME DAY AS LONG AS YOU CALL BEFORE 4:00 PM

## 2021-07-12 NOTE — Progress Notes (Signed)
Blood count improved.  Liver function normal.  Kidney function abnormal, but stable.  Cholesterol: Good, but LDL of less than 70 is goal for her due to significant heart disease. Continue zetia. Intolerant to statins. Continue cardiopulmonary rehab.  I will forward this to Dr. Debara Pickett, who I have encouraged the patient to call to set up follow up for high cholesterol.

## 2021-07-12 NOTE — Progress Notes (Signed)
Advanced Heart Failure Clinic Note   PCP: CoxElnita Maxwell, MD PCP-Cardiologist: [name], MD   Reason for Visit: Hospital f/u acute on chronic diastolic HF  HPI: Dana [name] is 60 y.o. female with history of diastolic heart failure s/p cardiomems 02/2018, CAD, s/p DES RCA and LAD 2018, DMI, htn, hyperlipidemia, PAD, and OSA on CPAP, PAF s/p ablation, PAD s/p L great toe and 4th toe amputation, stage IIIb CKD.   Admitted 03/22 with NSTEMI and a/c CHF. LHC 03/22 showed moderate mid-LAD and mid-RCA disease, severe mid-L-Cx stenosis s/p PCI/DES mid Cx and side branch angioplasty of OM1.   Unfortunately she developed a contrast-induced nephropathy after cath and creatinine increased from 2.2 to a peak of 5.5. Additionally, she developed severe nausea and vomiting, gastroparesis and required IV antiplatelet therapy.  During this time she developed epistaxis and acute blood loss anemia requiring transfusion.  She was switched back to oral DAPT. Diuretics were reintroduced and her creatinine began to improve over the following days. Her leukocytosis persisted but all her infectious workup was negative and this was felt to be reactive.    Per chart review, she was admitted 6/22 to [facility] for DKA, started on insulin gtt, IVF and diuretics held. Developed pulmonary vascular congestion vs PNA and was started on IV abx, and diuretics were resumed. She suffered a PEA arrest 01/12/21 with ROSC after 10-15 minutes of CPR and was transferred to [place]; precipitating factor for PEA arrest thought to be respiratory driven. On arrival, she was still in DKA, AKI on CKD with oliguria and Nephrology was consulted for further management. TTE with EF 60%, min MR/TR. Concern for stroke due to left-sided weakness and MRI confirmed anoxic brain injury, but no definite infarct. She was extubated, SCr returned to baseline of 1.6-1.8 and she was discharged to rehab facility.   Zio placed 8/22 for palpitations, showing mostly  SB/SR no afib, 66 SVT runs, frequent isolated SVE (11.2%).  Admitted from AHF clinic 10/22 for A/C CHF after failing outpatient escalation in diuretic therapy. She was enrolled in FASTR trial and diuresed > 15 lb.  Hospitalization complicated by AKI on CKD, Scr up to 3.13. RHC showed mild to moderate elevating filling pressures with large v-waves in PCWP tracing suggesting severe MR vs severe diastolic dysfunction. TEE showed severe central MR.   Patient underwent transcatheter edge-to-edge repair of mitral valve with placement of 1XT W and 1NT W clip on 05/18/2021. NT W clip was entangled and had to be deployed into lateral part of valve. No additional clip placement could be preformed. Echo 11/04 with EF 50-55% and residual moderate MR. Given 1 dose IV lasix prior to discharge.   Today she returns for HF follow up with husband. Overall feeling fine. No SOB with walking on flat ground with cane, still struggling with balance. Just started CR and going 3x/week. Had a nosebleed last night that lasted 2 hours, but no further abnormal bleeding. Denies  CP, edema, or PND/Orthopnea. Appetite ok. No fever or chills. Weight at home 225 pounds. Taking all medications. Still has UTI, on 3rd round of abx. Limits fluids to <1800 ml. Has repeat echo and f/u with Structural Heart Team later today.  Cardiac Studies: - Echo 11/22: EF 50-55%, residual moderate MR  - RHC (10/22):   RA = 8 RV = 59/9 PA = 61/17 (35) PCW = mean 23 (v = 49) -> performed multiple wedges Fick cardiac output/index = PVR = 1.6 WU FA sat = 98% PA sat =  69%, 70% PaPi = 5.5 Assessment: 1. Mild to moderately elevated filling pressures with very large v-waves in PCWP tracing suggestive of severe diastolic dysfunction vs severe MR  - TEE (10/22): EF 55-60%, basal inferior akinesis, severe central MR. - Echo (10/22): EF 65-70%, RV ok, moderate MR - Echo (5/21): EF 60-65%, RV ok, Grade II DD. - Echo (2/22): EF 55-60%, RV ok, Grade II DD,  mod MR - Echo (3/22): EF 55-60%, Grade II DD, normal RV - Echo (6/22): EF 60%, mod MR/TR (per chart review at [place]). - Zio (8/19): no arrhythmias, occasional PAC/PVCs - Zio (9/22): mostly SB/SR no afib, 66 SVT runs, frequent isolated SVE (11.2%).  - LHC (3/22): 3 vessel CAD with moderate mid-LAD and mid-RCA stenoses, and severe mid-LCx stenosis; successful PCI of the mid-circumflex, reducing severe 90% stenosis to 0% with a 2.75x22 mm Resolute Onyx DES, provisional side branch angioplasty of the OM1 with a 2.5 mm balloon through the stent struts  Review of systems complete and found to be negative unless listed in HPI.    Past Medical History:  Diagnosis Date   Acquired absence of left great toe (HCC)    Acquired absence of other left toe(s) ([place])    Anemia    Anxiety    CAD (coronary artery disease)    DES mLAD 04/07/15; DES dRCA & DES pPDA 08/30/16; DES mCX & PTCA OM1 09/29/20   Chronic diastolic (congestive) heart failure (HCC)    Chronic systolic (congestive) heart failure (HCC)    CKD (chronic kidney disease)    Contrast dye induced nephropathy, possible 09/03/2020   Diabetes (HCC)    Diastolic CHF (HCC)    Dyspnea    Gastro-esophageal reflux disease without esophagitis    Hyperlipidemia    Hypertension    Hypertensive heart disease with heart failure (HCC)    Hypoxemia    Idiopathic gout, multiple sites    Localized edema    Low back pain    Mitral regurgitation    Mixed hyperlipidemia    Mixed incontinence    Morbid (severe) obesity due to excess calories (HCC)    Neuromuscular disorder (HCC)    Neuropathy    Non-ST elevation (NSTEMI) myocardial infarction ([place])    08/29/20, 09/29/20   OSA (obstructive sleep apnea) 09/03/2020   Other specified hypothyroidism    PAF (paroxysmal atrial fibrillation) (HCC)    s/p pulmonary vein isolation by cryoablation 10/04/15 Dr. Minna Merritts in [facility]   PEA (Pulseless electrical activity) [facility])    ~ 01/13/21 at [facility] during  admission DKA, volume overload, possible CAP, hypoxia s/p ACLS with ROSC ~ 10-15   Pneumonia    Primary insomnia    Type 2 diabetes mellitus with diabetic nephropathy (HCC)     Current Outpatient Medications  Medication Sig Dispense Refill   allopurinol (ZYLOPRIM) 100 MG tablet Take 1 tablet (100 mg total) by mouth daily. 90 tablet 0   aspirin EC 81 MG EC tablet Take 1 tablet (81 mg total) by mouth daily. Swallow whole. 90 tablet 3   BD PEN NEEDLE NANO 2ND GEN 32G X 4 MM MISC 4 (four) times daily.     carvedilol (COREG) 12.5 MG tablet Take 1 tablet (12.5 mg total) by mouth 2 (two) times daily. 60 tablet 11   Cholecalciferol (VITAMIN D) 50 MCG (2000 UT) tablet Take 2,000 Units by mouth daily. Take one daily     clopidogrel (PLAVIX) 75 MG tablet Take 1 tablet (75 mg total) by mouth  at bedtime. 90 tablet 3   Cyanocobalamin 3000 MCG CAPS Take 2 capsules by mouth daily. Patient purchased 3000 mcg gummies and taking 2 gummies daily     EPINEPHrine 0.3 mg/0.3 mL IJ SOAJ injection Use as directed for life-threatening allergic reaction. 4 Device 3   ezetimibe (ZETIA) 10 MG tablet Take 1 tablet (10 mg total) by mouth daily. 90 tablet 2   ferrous sulfate 325 (65 FE) MG tablet Take 325 mg by mouth 2 (two) times daily with a meal.     hydrALAZINE (APRESOLINE) 25 MG tablet TAKE 1 TABLET BY MOUTH THREE TIMES DAILY 90 tablet 3   HYDROcodone-acetaminophen (NORCO) 7.5-325 MG tablet Take 1 tablet by mouth 3 (three) times daily as needed for moderate pain. 90 tablet 0   insulin aspart (NOVOLOG) 100 UNIT/ML injection Inject 2-10 Units into the skin 3 (three) times daily before meals.     isosorbide mononitrate (IMDUR) 30 MG 24 hr tablet Take 1 tablet (30 mg total) by mouth daily. 90 tablet 3   lactulose (CHRONULAC) 10 GM/15ML solution Take 15 mLs (10 g total) by mouth daily as needed for mild constipation. 236 mL 0   latanoprost (XALATAN) 0.005 % ophthalmic solution Place 1 drop into both eyes at bedtime.      LEVEMIR FLEXTOUCH 100 UNIT/ML FlexPen Inject 50 Units into the skin daily.     LORazepam (ATIVAN) 0.5 MG tablet Take 1 tablet (0.5 mg total) by mouth daily as needed for anxiety. 30 tablet 2   magnesium oxide (MAG-OX) 400 MG tablet Take 2 tablets (800 mg total) by mouth daily. Take 2 (400 mg) daily=800 mg 60 tablet 1   nitroGLYCERIN (NITROSTAT) 0.4 MG SL tablet Place 1 tablet (0.4 mg total) under the tongue every 5 (five) minutes as needed for chest pain. 25 tablet 3   Omega-3 Fatty Acids (FISH OIL PO) Take 1 capsule by mouth daily.     OXYGEN Inhale 2 L into the lungs at bedtime.     pantoprazole (PROTONIX) 40 MG tablet Take 1 tablet (40 mg total) by mouth daily. 90 tablet 0   potassium chloride SA (KLOR-CON) 20 MEQ tablet Take 1 tablet (20 mEq total) by mouth daily. 90 tablet 3   promethazine (PHENERGAN) 25 MG tablet Take 1 tablet (25 mg total) by mouth every 6 (six) hours as needed for nausea or vomiting. 90 tablet 1   torsemide (DEMADEX) 20 MG tablet Take 3 tablets (60 mg total) by mouth daily. 90 tablet 3   amoxicillin-clavulanate (AUGMENTIN) 875-125 MG tablet Take 1 tablet by mouth 2 (two) times daily for 5 days. (Patient not taking: Reported on 07/12/2021) 10 tablet 0   busPIRone (BUSPAR) 5 MG tablet Take 1 tablet (5 mg total) by mouth 3 (three) times daily. (Patient not taking: Reported on 07/12/2021) 90 tablet 2   Evolocumab (REPATHA SURECLICK) 856 MG/ML SOAJ Inject 1 Dose into the skin every 14 (fourteen) days. (Patient not taking: Reported on 07/11/2021) 6 mL 3   Probiotic CAPS Take 1 capsule by mouth daily. (Patient not taking: Reported on 07/12/2021)     No current facility-administered medications for this encounter.   Allergies  Allergen Reactions   Naproxen Hives and Rash   Celecoxib Other (See Comments)    Stomach pain   Aspirin Other (See Comments)    CAN NOT TAKE DUE TO ULCERS (documented previously) but takes every day currently (as of 09/29/20) without reported issues Can  take coated aspirin   Glucosamine Hives, Swelling  and Other (See Comments)    Angioedema    Iron     IV iron transfusion - hives - Feb 2022 hospitalization   Metoclopramide Other (See Comments)    Facial twitching and stuttering   Metolazone Other (See Comments)    Acute renal failure   Shellfish-Derived Products Hives, Swelling and Other (See Comments)    Angioedema    Tramadol Nausea And Vomiting        Social History   Socioeconomic History   Marital status: Married    Spouse name: Not on file   Number of children: Not on file   Years of education: Not on file   Highest education level: Not on file  Occupational History   Not on file  Tobacco Use   Smoking status: Former   Smokeless tobacco: Never  Vaping Use   Vaping Use: Never used  Substance and Sexual Activity   Alcohol use: Not Currently   Drug use: Not Currently   Sexual activity: Not on file  Other Topics Concern   Not on file  Social History Narrative   Not on file   Social Determinants of Health   Financial Resource Strain: Low Risk    Difficulty of Paying Living Expenses: Not very hard  Food Insecurity: No Food Insecurity   Worried About Running Out of Food in the Last Year: Never true   Ran Out of Food in the Last Year: Never true  Transportation Needs: No Transportation Needs   Lack of Transportation (Medical): No   Lack of Transportation (Non-Medical): No  Physical Activity: Not on file  Stress: Not on file  Social Connections: Not on file  Intimate Partner Violence: Not on file   Family History  Problem Relation Age of Onset   Hypertension Father    Coronary artery disease Mother    Hypertension Mother    Hyperlipidemia Mother    Diabetes type II Mother    Breast cancer Mother    Lung cancer Mother    Bipolar disorder Other    Depression Other    Schizophrenia Other    BP (!) 106/54    Pulse 73    Wt 103.3 kg (227 lb 12.8 oz)    SpO2 99%    BMI 38.50 kg/m   Wt Readings from Last  3 Encounters:  07/12/21 103.3 kg (227 lb 12.8 oz)  07/11/21 102.5 kg (226 lb)  05/25/21 107 kg (235 lb 12.8 oz)   PHYSICAL EXAM: General:  NAD. No resp difficulty, arrived in [facility] HEENT: Normal Neck: Supple. No JVD. Carotids 2+ bilat; no bruits. No lymphadenopathy or thryomegaly appreciated. Cor: PMI nondisplaced. Regular rate & rhythm. No rubs, gallops, 2/6 HSM murmur at apex Lungs: Clear Abdomen: Obese,  nontender, nondistended. No hepatosplenomegaly. No bruits or masses. Good bowel sounds. Extremities: No cyanosis, clubbing, rash, 1+ BLE edema, compression hose on Neuro: Alert & oriented x 3, cranial nerves grossly intact. Moves all 4 extremities w/o difficulty. Affect pleasant.  CardioMEMs: Goal dPAP: 10  Today's dPAP: 9  ASSESSMENT & PLAN: 1.  Chronic Diastolic Heart Failure - Echo 08/2020 EF 55-60%  - Echo (3/22): EF 55-60%, Grade II DD, normal RV - Echo (6/22): EF 60%, mod MR/TR (per chart review at [place]). - RHC (10/22): mild to moderate elevated filling pressures, large v-waves PCWP tracing - TEE (10/22): EF 55-60%, basal inferior akinesis, severe central MR. - Recent admit 10/22 with a/c CHF, enrolled in FASTR trial and diuresed 15 lbs. Severe MR likely  contributing to decompensation.  - Echo (11/22): EF 50-55%, severe LV HK, moderate MR - NYHA III. Volume looks good on exam and CardioMems. Weight down 8 lbs since last visit. - Continue torsemide 60 mg daily. - Continue carvedilol 12.5 mg bid. - Continue hydralazine 25 mg tid + Imdur 30 mg daily. - No SGLT2i with T1DM. - Continue TED hose. - Labs from PCP visit 07/11/21 reviewed and look OK; SCr 2.32, K 4.8, Hgb 8.5   2. CAD - Known CAD with prior stents to mid LAD and pRCA.  - NSTEMI (2/22). Cath deferred due to AKI. Treated medically.  - NSTEMI (7/22)-->S/p PCI/DES to mid Cx, balloon angioplasty to OM1  - Denies CP - Ideally continue ASA + Plavix for a minimum of 12 months post DES. - Lipids under good control w/  Repatha + Zetia (LDL 66 7/22). Followed by Dr. Debara Pickett.    3. CKD IIIb - Prior h/o CIN following LHC - Previous SCr baseline 1.6-1.8 with multiple AKIs over the last year. New baseline ~ 2.4  - [facility] - SCr 2.32 07/11/21  4. Mitral regurgitation - Severe central MR on TEE on 10/22.   - S/p TEER 05/18/2021 - two clips placed, one became entangled and had to be deployed. - Moderate residual MR on echo 11/22 - Repeat echo and follow up with Structural Heart Team today scheduled. - May potentially consider reclipping in the future depending on f/u echo, but need to wait at least 3 months to allow adequate fibrosis of previously placed clips.    5. OSA -  CPAP qhs.    6. DM1 - Hgb A1c 6.6 10/22. - No SGLT2i.   7. Chronic Anemia - Baseline 8-9, likely 2/2 renal disease.  - Hx severe epistaxis and ABLA requiring transfusion - Receives Procrit through nephrology - Hx rash with IV iron - Transfuse if Hgb < 7 - Hgb 8.5 on 07/11/21  8. Palpitations - Hx PAF s/p ablation at least 10 years ago. No documented recurrence. Not currently on anticoagulation. Would need to consider risk/benefits of anticoagulation if recurs especially with history of anemia requiring transfusion - Zio XT 14 day (8/22) showed mostly SB/SR no afib, 66 SVT runs, frequent isolated SVE (11.2%). - No further symptoms. - Continue carvedilol 12.5 mg bid.  9. UTI - Saw PCP  yesterday, placed on Augmentin. This will be her 3rd round of abx. - Having issues since foley removal during last admission. - No longer on SGLT2i. - May need Urology referral, defer to PCP.  Follow up with Dr. [name] next month as scheduled.  Allena Katz, FNP-BC 07/12/21

## 2021-07-13 ENCOUNTER — Other Ambulatory Visit: Payer: Self-pay | Admitting: *Deleted

## 2021-07-13 DIAGNOSIS — E782 Mixed hyperlipidemia: Secondary | ICD-10-CM

## 2021-07-13 MED ORDER — REPATHA SURECLICK 140 MG/ML ~~LOC~~ SOAJ: 1.0000 | SUBCUTANEOUS | 31 refills | Status: DC

## 2021-07-14 LAB — ECHOCARDIOGRAM COMPLETE
Area-P 1/2: 2.66 cm2
MV M vel: 4.5 m/s
MV Peak grad: 81 mmHg
MV VTI: 1.24 cm2
Radius: 0.6 cm
S' Lateral: 3.7 cm
Weight: 3644.8 oz

## 2021-07-14 LAB — URINE CULTURE

## 2021-07-15 ENCOUNTER — Encounter: Payer: Self-pay | Admitting: Family Medicine

## 2021-07-15 NOTE — Assessment & Plan Note (Signed)
Intolerant to statins. 

## 2021-07-15 NOTE — Assessment & Plan Note (Signed)
ANXIETY: START ON BUSPIRONE 5 MG ONE THREE TIMES A DAY. CALL BACK IF NOT HELPING ENOUGH. RECOMMEND COUNSELING.

## 2021-07-15 NOTE — Assessment & Plan Note (Signed)
Continue hydrocodone/apap tid prn severe back pain. Unable to use nsaids.

## 2021-07-15 NOTE — Assessment & Plan Note (Signed)
Follow-up with nephrology. 

## 2021-07-15 NOTE — Assessment & Plan Note (Signed)
The current medical regimen is effective;  continue present plan and medications.  

## 2021-07-15 NOTE — Assessment & Plan Note (Addendum)
BLADDER INFECTION: SENT AUGMENTIN BASED ON LAST CULTURE SENSITIVITY.  Ordered urine culture and UA.

## 2021-07-15 NOTE — Assessment & Plan Note (Addendum)
The current medical regimen is fairly effective;  continue present plan and medications.  

## 2021-07-15 NOTE — Assessment & Plan Note (Addendum)
The current medical regimen is effective;  continue present plan and medications. Management per specialist.   

## 2021-07-15 NOTE — Assessment & Plan Note (Addendum)
>>  ASSESSMENT AND PLAN FOR DIABETIC POLYNEUROPATHY ASSOCIATED WITH TYPE 2 DIABETES MELLITUS (HCC) WRITTEN ON 07/15/2021 10:49 AM BY LEAL-BORJAS, Lucas Exline I, CMA  Management per specialist. Control: Well controlled. Recommend check sugars fasting daily. Recommend check feet daily. Recommend annual eye exams. Medicines: No changes Continue to work on eating a healthy diet and exercise.  Labs drawn today.     >>ASSESSMENT AND PLAN FOR DIABETIC NEPHROPATHY ASSOCIATED WITH TYPE 2 DIABETES MELLITUS (HCC) WRITTEN ON 07/15/2021  7:01 PM BY COX, KIRSTEN, MD  Management per specialist. At goal. The current medical regimen is effective;  continue present plan and medications. Continue use of CGM.

## 2021-07-15 NOTE — Assessment & Plan Note (Addendum)
Management per specialist. At goal. The current medical regimen is effective;  continue present plan and medications. Continue use of CGM.

## 2021-07-15 NOTE — Assessment & Plan Note (Signed)
S/p MV repair 05/2021. Doing well.

## 2021-07-15 NOTE — Assessment & Plan Note (Addendum)
CONTINUE CARDIOPULMONARY REHAB.  Continue oxygen at 2 L at night.

## 2021-07-15 NOTE — Assessment & Plan Note (Addendum)
HEART DISEASE AND HIGH CHOLESTEROL: CALL DR. HILTY'S OFFICE FOR REPATHA. Recommend continue to work on eating healthy diet and exercise. Intolerant to statins

## 2021-07-15 NOTE — Assessment & Plan Note (Addendum)
Continue cpap with oxygen

## 2021-07-18 DIAGNOSIS — Z95818 Presence of other cardiac implants and grafts: Secondary | ICD-10-CM | POA: Diagnosis not present

## 2021-07-18 DIAGNOSIS — Z952 Presence of prosthetic heart valve: Secondary | ICD-10-CM | POA: Diagnosis not present

## 2021-07-18 DIAGNOSIS — Z9889 Other specified postprocedural states: Secondary | ICD-10-CM | POA: Diagnosis not present

## 2021-07-19 DIAGNOSIS — F411 Generalized anxiety disorder: Secondary | ICD-10-CM | POA: Diagnosis not present

## 2021-07-20 ENCOUNTER — Other Ambulatory Visit: Payer: Self-pay | Admitting: *Deleted

## 2021-07-20 DIAGNOSIS — E782 Mixed hyperlipidemia: Secondary | ICD-10-CM

## 2021-07-21 ENCOUNTER — Telehealth: Payer: Self-pay

## 2021-07-21 ENCOUNTER — Other Ambulatory Visit: Payer: Self-pay

## 2021-07-21 MED ORDER — HYDROCODONE-ACETAMINOPHEN 7.5-325 MG PO TABS: 1.0000 | ORAL_TABLET | Freq: Three times a day (TID) | ORAL | 0 refills | Status: DC | PRN

## 2021-07-21 NOTE — Progress Notes (Signed)
° ° °  Chronic Care Management Pharmacy Assistant   Name: Dana [name]  MRN: [id number] DOB: May 08, 1961   Reason for Encounter: Reminder call for appointment 07/27/2021 at  10:30 am.  Patient confirmed   [place] Pharmacist Assistant [phone]

## 2021-07-21 NOTE — Chronic Care Management (AMB) (Signed)
Chronic Care Management Pharmacy Assistant   Name: Dana [name]  MRN: [id number] DOB: [birth year]  Reason for Encounter: Medication Coordination for [facility]    Recent office visits:  07/11/21 [name] MD. Seen for routine visit. Started Augmentin 875-125 mg 2 times daily and Buspirone HCI 5 mg 3 times daily.  07/05/21 [name] MD. Orders Only. Reordered Norco 7.5-325mg  3 times daily prn.   Recent consult visits:  07/13/21 (Cardiology) Sheral Apley RN. Reordered Rapatha Sureclick 546 mg/ml every 14 days.   07/12/21 (Cardiology) Kathyrn Drown NP. Seen for CHF. No med changes.   Hospital visits:  None   Medications: Outpatient Encounter Medications as of 07/21/2021  Medication Sig   allopurinol (ZYLOPRIM) 100 MG tablet Take 1 tablet (100 mg total) by mouth daily.   amoxicillin (AMOXIL) 500 MG capsule TAKE FOUR CAPSULES BY MOUTH ONE HOUR BEFORE DENTAL PROCEDURES AND CLEANINGS   aspirin EC 81 MG EC tablet Take 1 tablet (81 mg total) by mouth daily. Swallow whole.   BD PEN NEEDLE NANO 2ND GEN 32G X 4 MM MISC 4 (four) times daily.   busPIRone (BUSPAR) 5 MG tablet Take 1 tablet (5 mg total) by mouth 3 (three) times daily.   carvedilol (COREG) 12.5 MG tablet Take 1 tablet (12.5 mg total) by mouth 2 (two) times daily.   Cholecalciferol (VITAMIN D) 50 MCG (2000 UT) tablet Take 2,000 Units by mouth daily. Take one daily   clopidogrel (PLAVIX) 75 MG tablet Take 1 tablet (75 mg total) by mouth at bedtime.   Cyanocobalamin 3000 MCG CAPS Take 2 capsules by mouth daily. Patient purchased 3000 mcg gummies and taking 2 gummies daily   EPINEPHrine 0.3 mg/0.3 mL IJ SOAJ injection Use as directed for life-threatening allergic reaction.   Evolocumab (REPATHA SURECLICK) 568 MG/ML SOAJ Inject 1 Dose into the skin every 14 (fourteen) days.   ezetimibe (ZETIA) 10 MG tablet Take 1 tablet (10 mg total) by mouth daily.   ferrous sulfate 325 (65 FE) MG tablet Take 325 mg by mouth 2 (two) times  daily with a meal.   hydrALAZINE (APRESOLINE) 25 MG tablet TAKE 1 TABLET BY MOUTH THREE TIMES DAILY   HYDROcodone-acetaminophen (NORCO) 7.5-325 MG tablet Take 1 tablet by mouth 3 (three) times daily as needed for moderate pain.   insulin aspart (NOVOLOG) 100 UNIT/ML injection Inject 2-10 Units into the skin 3 (three) times daily before meals.   isosorbide mononitrate (IMDUR) 30 MG 24 hr tablet Take 1 tablet (30 mg total) by mouth daily.   lactulose (CHRONULAC) 10 GM/15ML solution Take 15 mLs (10 g total) by mouth daily as needed for mild constipation.   latanoprost (XALATAN) 0.005 % ophthalmic solution Place 1 drop into both eyes at bedtime.   LEVEMIR FLEXTOUCH 100 UNIT/ML FlexPen Inject 50 Units into the skin daily.   LORazepam (ATIVAN) 0.5 MG tablet Take 1 tablet (0.5 mg total) by mouth daily as needed for anxiety.   magnesium oxide (MAG-OX) 400 MG tablet Take 2 tablets (800 mg total) by mouth daily. Take 2 (400 mg) daily=800 mg   nitroGLYCERIN (NITROSTAT) 0.4 MG SL tablet Place 1 tablet (0.4 mg total) under the tongue every 5 (five) minutes as needed for chest pain.   Omega-3 Fatty Acids (FISH OIL PO) Take 1 capsule by mouth daily.   OXYGEN Inhale 2 L into the lungs at bedtime.   pantoprazole (PROTONIX) 40 MG tablet Take 1 tablet (40 mg total) by mouth daily.   potassium chloride SA (  KLOR-CON) 20 MEQ tablet Take 1 tablet (20 mEq total) by mouth daily.   Probiotic CAPS Take 1 capsule by mouth daily.   promethazine (PHENERGAN) 25 MG tablet Take 1 tablet (25 mg total) by mouth every 6 (six) hours as needed for nausea or vomiting.   torsemide (DEMADEX) 20 MG tablet Take 3 tablets (60 mg total) by mouth daily.   No facility-administered encounter medications on file as of 07/21/2021.   Reviewed chart for medication changes ahead of medication coordination call.  No OVs, Consults, or hospital visits since last care coordination call/Pharmacist visit. (If appropriate, list visit date, provider  name)  No medication changes indicated OR if recent visit, treatment plan here.  BP Readings from Last 3 Encounters:  07/12/21 (!) 106/54  07/12/21 (!) 100/42  07/11/21 (!) 120/50    Lab Results  Component Value Date   HGBA1C 6.6 (H) 04/25/2021     Patient obtains medications through [facility]  30 Days   Patient is due for her first adherence delivery on: 08/02/21. Called patient and reviewed medications and coordinated delivery.  This delivery to include: hydrocodone/APAP 7.5-325mg  1 tab three times daily as needed    Patient declined the following medications  Pt receives all other meds through [place]. [facility] pharmacy was out of stock of her Hydrocodone and wanted [name] to fill it. For now pt request only this medication through [name].   Patient needs refills for- Refill request sent to clinical pool  hydrocodone/APAP 7.5-325mg    No acute or short fill needed  Confirmed delivery date of 08/02/21, advised patient that pharmacy will contact them the morning of delivery.  Elray Mcgregor, [place] Pharmacist Assistant  [phone]

## 2021-07-24 NOTE — Telephone Encounter (Signed)
Compliant on meds 

## 2021-07-25 DIAGNOSIS — F411 Generalized anxiety disorder: Secondary | ICD-10-CM | POA: Diagnosis not present

## 2021-07-26 ENCOUNTER — Encounter (HOSPITAL_COMMUNITY): Payer: Self-pay | Admitting: Internal Medicine

## 2021-07-26 ENCOUNTER — Ambulatory Visit (HOSPITAL_COMMUNITY)
Admission: RE | Admit: 2021-07-26 | Discharge: 2021-07-26 | Disposition: A | Discharge status: Home / Self Care | Payer: HMO | Source: Ambulatory Visit | Attending: Internal Medicine | Admitting: Internal Medicine

## 2021-07-26 ENCOUNTER — Encounter: Payer: HMO | Admitting: *Deleted

## 2021-07-26 ENCOUNTER — Other Ambulatory Visit: Payer: Self-pay

## 2021-07-26 VITALS — BP 140/80 | HR 71 | Ht 64.5 in | Wt 234.0 lb

## 2021-07-26 DIAGNOSIS — D72829 Elevated white blood cell count, unspecified: Secondary | ICD-10-CM | POA: Insufficient documentation

## 2021-07-26 DIAGNOSIS — I5032 Chronic diastolic (congestive) heart failure: Secondary | ICD-10-CM | POA: Diagnosis not present

## 2021-07-26 DIAGNOSIS — I2510 Atherosclerotic heart disease of native coronary artery without angina pectoris: Secondary | ICD-10-CM | POA: Diagnosis not present

## 2021-07-26 DIAGNOSIS — E782 Mixed hyperlipidemia: Secondary | ICD-10-CM | POA: Diagnosis not present

## 2021-07-26 DIAGNOSIS — R040 Epistaxis: Secondary | ICD-10-CM | POA: Insufficient documentation

## 2021-07-26 DIAGNOSIS — N184 Chronic kidney disease, stage 4 (severe): Secondary | ICD-10-CM | POA: Diagnosis not present

## 2021-07-26 DIAGNOSIS — I252 Old myocardial infarction: Secondary | ICD-10-CM | POA: Diagnosis not present

## 2021-07-26 DIAGNOSIS — N1832 Chronic kidney disease, stage 3b: Secondary | ICD-10-CM | POA: Diagnosis not present

## 2021-07-26 DIAGNOSIS — G4733 Obstructive sleep apnea (adult) (pediatric): Secondary | ICD-10-CM

## 2021-07-26 DIAGNOSIS — I480 Paroxysmal atrial fibrillation: Secondary | ICD-10-CM | POA: Diagnosis not present

## 2021-07-26 DIAGNOSIS — Z9889 Other specified postprocedural states: Secondary | ICD-10-CM | POA: Diagnosis not present

## 2021-07-26 DIAGNOSIS — R002 Palpitations: Secondary | ICD-10-CM | POA: Diagnosis not present

## 2021-07-26 DIAGNOSIS — D62 Acute posthemorrhagic anemia: Secondary | ICD-10-CM | POA: Insufficient documentation

## 2021-07-26 DIAGNOSIS — I739 Peripheral vascular disease, unspecified: Secondary | ICD-10-CM | POA: Insufficient documentation

## 2021-07-26 DIAGNOSIS — I340 Nonrheumatic mitral (valve) insufficiency: Secondary | ICD-10-CM | POA: Diagnosis not present

## 2021-07-26 DIAGNOSIS — D6489 Other specified anemias: Secondary | ICD-10-CM | POA: Insufficient documentation

## 2021-07-26 DIAGNOSIS — Z794 Long term (current) use of insulin: Secondary | ICD-10-CM | POA: Diagnosis not present

## 2021-07-26 DIAGNOSIS — Z95818 Presence of other cardiac implants and grafts: Secondary | ICD-10-CM | POA: Diagnosis not present

## 2021-07-26 DIAGNOSIS — Z955 Presence of coronary angioplasty implant and graft: Secondary | ICD-10-CM | POA: Insufficient documentation

## 2021-07-26 DIAGNOSIS — I130 Hypertensive heart and chronic kidney disease with heart failure and stage 1 through stage 4 chronic kidney disease, or unspecified chronic kidney disease: Secondary | ICD-10-CM | POA: Diagnosis not present

## 2021-07-26 DIAGNOSIS — Z79899 Other long term (current) drug therapy: Secondary | ICD-10-CM | POA: Diagnosis not present

## 2021-07-26 DIAGNOSIS — Z89422 Acquired absence of other left toe(s): Secondary | ICD-10-CM | POA: Insufficient documentation

## 2021-07-26 DIAGNOSIS — S0689AS Other specified intracranial injury with loss of consciousness status unknown, sequela: Secondary | ICD-10-CM | POA: Insufficient documentation

## 2021-07-26 DIAGNOSIS — Z89412 Acquired absence of left great toe: Secondary | ICD-10-CM | POA: Insufficient documentation

## 2021-07-26 DIAGNOSIS — Z7982 Long term (current) use of aspirin: Secondary | ICD-10-CM | POA: Insufficient documentation

## 2021-07-26 DIAGNOSIS — E1043 Type 1 diabetes mellitus with diabetic autonomic (poly)neuropathy: Secondary | ICD-10-CM | POA: Insufficient documentation

## 2021-07-26 DIAGNOSIS — Z006 Encounter for examination for normal comparison and control in clinical research program: Secondary | ICD-10-CM

## 2021-07-26 DIAGNOSIS — E1022 Type 1 diabetes mellitus with diabetic chronic kidney disease: Secondary | ICD-10-CM

## 2021-07-26 DIAGNOSIS — Z7902 Long term (current) use of antithrombotics/antiplatelets: Secondary | ICD-10-CM | POA: Diagnosis not present

## 2021-07-26 DIAGNOSIS — Y939 Activity, unspecified: Secondary | ICD-10-CM | POA: Diagnosis not present

## 2021-07-26 DIAGNOSIS — Z9989 Dependence on other enabling machines and devices: Secondary | ICD-10-CM | POA: Diagnosis not present

## 2021-07-26 DIAGNOSIS — E1010 Type 1 diabetes mellitus with ketoacidosis without coma: Secondary | ICD-10-CM | POA: Insufficient documentation

## 2021-07-26 DIAGNOSIS — Z8674 Personal history of sudden cardiac arrest: Secondary | ICD-10-CM | POA: Insufficient documentation

## 2021-07-26 LAB — COMPREHENSIVE METABOLIC PANEL
ALT: 15 U/L (ref 0–44)
AST: 18 U/L (ref 15–41)
Albumin: 3.4 g/dL — ABNORMAL LOW (ref 3.5–5.0)
Alkaline Phosphatase: 125 U/L (ref 38–126)
Anion gap: 9 (ref 5–15)
BUN: 50 mg/dL — ABNORMAL HIGH (ref 6–20)
CO2: 28 mmol/L (ref 22–32)
Calcium: 9 mg/dL (ref 8.9–10.3)
Chloride: 102 mmol/L (ref 98–111)
Creatinine, Ser: 2.29 mg/dL — ABNORMAL HIGH (ref 0.44–1.00)
GFR, Estimated: 24 mL/min — ABNORMAL LOW (ref >60)
Glucose, Bld: 75 mg/dL (ref 70–99)
Potassium: 4.5 mmol/L (ref 3.5–5.1)
Sodium: 139 mmol/L (ref 135–145)
Total Bilirubin: 0.5 mg/dL (ref 0.3–1.2)
Total Protein: 7.6 g/dL (ref 6.5–8.1)

## 2021-07-26 LAB — CBC
HCT: 27.1 % — ABNORMAL LOW (ref 36.0–46.0)
Hemoglobin: 8.2 g/dL — ABNORMAL LOW (ref 12.0–15.0)
MCH: 26.4 pg (ref 26.0–34.0)
MCHC: 30.3 g/dL (ref 30.0–36.0)
MCV: 87.1 fL (ref 80.0–100.0)
Platelets: 319 10*3/uL (ref 150–400)
RBC: 3.11 MIL/uL — ABNORMAL LOW (ref 3.87–5.11)
RDW: 15.2 % (ref 11.5–15.5)
WBC: 12.6 10*3/uL — ABNORMAL HIGH (ref 4.0–10.5)
nRBC: 0 % (ref 0.0–0.2)

## 2021-07-26 LAB — BRAIN NATRIURETIC PEPTIDE: B Natriuretic Peptide: 234.1 pg/mL — ABNORMAL HIGH (ref 0.0–100.0)

## 2021-07-26 NOTE — Research (Signed)
° °  SITE #  [id number]         SUBJECT #    [id number]      SUBJECT Initials: WGS  90- DAY FOLLOW UP VISIT  Discharge Date / time::  _15 / _OCT_/ _2022   at _13 :_24_      []   Time unknown        DD/MMM/YYYY        HH;MM  Date of Follow-up Visit: _11/ _JAN\_/ _2023    at _15 :_00__      []   Time unknown        DD/MMM/YYYY        HH;MM   Was there any change in subjects's medications?  []  YES  [x]   NO     Update:______  Did the subject experience any NEW adverse events?  []  YES  [x]   NO     Update:____   COMMENTS:   Seen in conjunction with Dr. Clayborne [facility] appointment. She has been attending the Cardiac Rehab program at [facility]. Mrs Roberg told me she has noticed a difference in her stamina doing her exercises. No CP, No JVD noted and only 1+ BLE edema at today's visit. CardioMems: 10, Goal is 10. There were no changes made today to her treatment plan, Patient is stable.   BP 140/80 Pulse 71 Ht 5' 4.5" (1.638 m) Wt 234 lb (106.1 kg) SpO2 98% BMI 39.55 kg/m BSA 2.2 m  Form is based on Pages 37 of FASTR Trial  eCRFs    Protocol [id number]

## 2021-07-26 NOTE — Progress Notes (Signed)
Advanced Heart Failure Clinic Note   PCP: CoxElnita Maxwell, MD PCP-Cardiologist: [name], MD   Reason for Visit: Hospital f/u acute on chronic diastolic HF  HPI: Ms Bowermaster is 61 y.o. female with history of diastolic heart failure s/p cardiomems 02/2018, CAD, s/p DES RCA and LAD 2018, DMI, htn, hyperlipidemia, PAD, and OSA on CPAP, PAF s/p ablation, PAD s/p L great toe and 4th toe amputation, stage IIIb CKD.   Admitted 03/22 with NSTEMI and a/c CHF. LHC 03/22 showed moderate mid-LAD and mid-RCA disease, severe mid-L-Cx stenosis s/p PCI/DES mid Cx and side branch angioplasty of OM1.   Unfortunately she developed a contrast-induced nephropathy after cath and creatinine increased from 2.2 to a peak of 5.5. Additionally, she developed severe nausea and vomiting, gastroparesis and required IV antiplatelet therapy.  During this time she developed epistaxis and acute blood loss anemia requiring transfusion.  She was switched back to oral DAPT. Diuretics were reintroduced and her creatinine began to improve over the following days. Her leukocytosis persisted but all her infectious workup was negative and this was felt to be reactive.    Per chart review, she was admitted 6/22 to [facility] for DKA, started on insulin gtt, IVF and diuretics held. Developed pulmonary vascular congestion vs PNA and was started on IV abx, and diuretics were resumed. She suffered a PEA arrest 01/12/21 with ROSC after 10-15 minutes of CPR and was transferred to [facility]; precipitating factor for PEA arrest thought to be respiratory driven. On arrival, she was still in DKA, AKI on CKD with oliguria and Nephrology was consulted for further management. TTE with EF 60%, min MR/TR. Concern for stroke due to left-sided weakness and MRI confirmed anoxic brain injury, but no definite infarct. She was extubated, SCr returned to baseline of 1.6-1.8 and she was discharged to rehab facility.   Zio placed 8/22 for palpitations, showing mostly  SB/SR no afib, 66 SVT runs, frequent isolated SVE (11.2%).  Admitted from AHF clinic 10/22 for A/C CHF after failing outpatient escalation in diuretic therapy. She was enrolled in FASTR trial and diuresed > 15 lb.  Hospitalization complicated by AKI on CKD, Scr up to 3.13. RHC showed mild to moderate elevating filling pressures with large v-waves in PCWP tracing suggesting severe MR vs severe diastolic dysfunction. TEE 55-60% showed severe central MR.   Patient underwent TEER of mitral valve with placement of 1XT W and 1NT W clip on 05/18/2021. NT W clip was entangled and had to be deployed into lateral part of valve. No additional clip placement could be preformed. Echo 11/04 with EF 50-55% and residual moderate MR. Given 1 dose IV lasix prior to discharge.   Today she returns for HF follow up with husband. She has been going to CR in [place] for 3-4 weeks. Has been doing well. Exercising without CP. Says she has been pushing herself and they tell her to slow down.  Volume status up and down. Weight up 3 pounds today.    Cardiac Studies: - Echo 11/22: EF 50-55%, residual moderate MR  - RHC (10/22):   RA = 8 RV = 59/9 PA = 61/17 (35) PCW = mean 23 (v = 49) -> performed multiple wedges Fick cardiac output/index = PVR = 1.6 WU FA sat = 98% PA sat = 69%, 70% PaPi = 5.5 Assessment: 1. Mild to moderately elevated filling pressures with very large v-waves in PCWP tracing suggestive of severe diastolic dysfunction vs severe MR  - TEE (10/22): EF 55-60%, basal inferior  akinesis, severe central MR. - Echo (10/22): EF 65-70%, RV ok, moderate MR - Echo (5/21): EF 60-65%, RV ok, Grade II DD. - Echo (2/22): EF 55-60%, RV ok, Grade II DD, mod MR - Echo (3/22): EF 55-60%, Grade II DD, normal RV - Echo (6/22): EF 60%, mod MR/TR (per chart review at [place]). - Zio (8/19): no arrhythmias, occasional PAC/PVCs - Zio (9/22): mostly SB/SR no afib, 66 SVT runs, frequent isolated SVE (11.2%).  - LHC  (3/22): 3 vessel CAD with moderate mid-LAD and mid-RCA stenoses, and severe mid-LCx stenosis; successful PCI of the mid-circumflex, reducing severe 90% stenosis to 0% with a 2.75x22 mm Resolute Onyx DES, provisional side branch angioplasty of the OM1 with a 2.5 mm balloon through the stent struts  Review of systems complete and found to be negative unless listed in HPI.    Past Medical History:  Diagnosis Date   Acquired absence of left great toe ([place])    Acquired absence of other left toe(s) ([place])    ACS (acute coronary syndrome) (HCC)    Anemia    Anxiety    CAD (coronary artery disease)    DES mLAD 04/07/15; DES dRCA & DES pPDA 08/30/16; DES mCX & PTCA OM1 09/29/20   Chronic diastolic (congestive) heart failure (HCC)    Chronic systolic (congestive) heart failure (HCC)    CKD (chronic kidney disease)    Contrast dye induced nephropathy, possible 09/03/2020   Diabetes (HCC)    Diastolic CHF (HCC)    Dyspnea    Gastro-esophageal reflux disease without esophagitis    Hyperlipidemia    Hypertension    Hypertensive heart disease with heart failure (HCC)    Hypoxemia    Idiopathic gout, multiple sites    Localized edema    Low back pain    Mitral regurgitation    Mixed hyperlipidemia    Mixed incontinence    Morbid (severe) obesity due to excess calories (HCC)    Neuromuscular disorder (HCC)    Neuropathy    Non-ST elevation (NSTEMI) myocardial infarction ([place])    08/29/20, 09/29/20   OSA (obstructive sleep apnea) 09/03/2020   Other specified hypothyroidism    PAF (paroxysmal atrial fibrillation) (HCC)    s/p pulmonary vein isolation by cryoablation 10/04/15 Dr. Minna Merritts in [facility]   PEA (Pulseless electrical activity) [facility])    ~ 01/13/21 at [facility] during admission DKA, volume overload, possible CAP, hypoxia s/p ACLS with ROSC ~ 10-15   Pneumonia    Primary insomnia    Type 2 diabetes mellitus with diabetic nephropathy (HCC)     Current Outpatient Medications   Medication Sig Dispense Refill   allopurinol (ZYLOPRIM) 100 MG tablet Take 1 tablet (100 mg total) by mouth daily. 90 tablet 0   amoxicillin (AMOXIL) 500 MG capsule TAKE FOUR CAPSULES BY MOUTH ONE HOUR BEFORE DENTAL PROCEDURES AND CLEANINGS     aspirin EC 81 MG EC tablet Take 1 tablet (81 mg total) by mouth daily. Swallow whole. 90 tablet 3   BD PEN NEEDLE NANO 2ND GEN 32G X 4 MM MISC 4 (four) times daily.     busPIRone (BUSPAR) 5 MG tablet Take 1 tablet (5 mg total) by mouth 3 (three) times daily. 90 tablet 2   carvedilol (COREG) 12.5 MG tablet Take 1 tablet (12.5 mg total) by mouth 2 (two) times daily. 60 tablet 11   Cholecalciferol (VITAMIN D) 50 MCG (2000 UT) tablet Take 2,000 Units by mouth daily. Take one daily  clopidogrel (PLAVIX) 75 MG tablet Take 1 tablet (75 mg total) by mouth at bedtime. 90 tablet 3   Cyanocobalamin 3000 MCG CAPS Take 2 capsules by mouth daily. Patient purchased 3000 mcg gummies and taking 2 gummies daily     EPINEPHrine 0.3 mg/0.3 mL IJ SOAJ injection Use as directed for life-threatening allergic reaction. 4 Device 3   Evolocumab (REPATHA SURECLICK) 025 MG/ML SOAJ Inject 1 Dose into the skin every 14 (fourteen) days. 6 mL 31   ezetimibe (ZETIA) 10 MG tablet Take 1 tablet (10 mg total) by mouth daily. 90 tablet 2   ferrous sulfate 325 (65 FE) MG tablet Take 325 mg by mouth 2 (two) times daily with a meal.     hydrALAZINE (APRESOLINE) 25 MG tablet TAKE 1 TABLET BY MOUTH THREE TIMES DAILY 90 tablet 3   HYDROcodone-acetaminophen (NORCO) 7.5-325 MG tablet Take 1 tablet by mouth 3 (three) times daily as needed for moderate pain. 90 tablet 0   insulin aspart (NOVOLOG) 100 UNIT/ML injection Inject 2-10 Units into the skin 3 (three) times daily before meals.     isosorbide mononitrate (IMDUR) 30 MG 24 hr tablet Take 1 tablet (30 mg total) by mouth daily. 90 tablet 3   lactulose (CHRONULAC) 10 GM/15ML solution Take 15 mLs (10 g total) by mouth daily as needed for mild  constipation. 236 mL 0   latanoprost (XALATAN) 0.005 % ophthalmic solution Place 1 drop into both eyes at bedtime.     LEVEMIR FLEXTOUCH 100 UNIT/ML FlexPen Inject 50 Units into the skin daily.     LORazepam (ATIVAN) 0.5 MG tablet Take 1 tablet (0.5 mg total) by mouth daily as needed for anxiety. 30 tablet 2   magnesium oxide (MAG-OX) 400 MG tablet Take 2 tablets (800 mg total) by mouth daily. Take 2 (400 mg) daily=800 mg 60 tablet 1   nitroGLYCERIN (NITROSTAT) 0.4 MG SL tablet Place 1 tablet (0.4 mg total) under the tongue every 5 (five) minutes as needed for chest pain. 25 tablet 3   Omega-3 Fatty Acids (FISH OIL PO) Take 1 capsule by mouth daily.     OXYGEN Inhale 2 L into the lungs at bedtime.     pantoprazole (PROTONIX) 40 MG tablet Take 1 tablet (40 mg total) by mouth daily. 90 tablet 0   potassium chloride SA (KLOR-CON) 20 MEQ tablet Take 1 tablet (20 mEq total) by mouth daily. 90 tablet 3   Probiotic CAPS Take 1 capsule by mouth daily.     promethazine (PHENERGAN) 25 MG tablet Take 1 tablet (25 mg total) by mouth every 6 (six) hours as needed for nausea or vomiting. 90 tablet 1   torsemide (DEMADEX) 20 MG tablet Take 3 tablets (60 mg total) by mouth daily. 90 tablet 3   No current facility-administered medications for this encounter.   Allergies  Allergen Reactions   Naproxen Hives and Rash   Celecoxib Other (See Comments)    Stomach pain   Aspirin Other (See Comments)    CAN NOT TAKE DUE TO ULCERS (documented previously) but takes every day currently (as of 09/29/20) without reported issues Can take coated aspirin   Glucosamine Hives, Swelling and Other (See Comments)    Angioedema    Iron     IV iron transfusion - hives - Feb 2022 hospitalization   Metoclopramide Other (See Comments)    Facial twitching and stuttering   Metolazone Other (See Comments)    Acute renal failure   Shellfish-Derived Products Hives, Swelling  and Other (See Comments)    Angioedema    Statins  Other (See Comments)    Muscle pain   Tramadol Nausea And Vomiting        Social History   Socioeconomic History   Marital status: Married    Spouse name: Not on file   Number of children: Not on file   Years of education: Not on file   Highest education level: Not on file  Occupational History   Not on file  Tobacco Use   Smoking status: Former   Smokeless tobacco: Never  Vaping Use   Vaping Use: Never used  Substance and Sexual Activity   Alcohol use: Not Currently   Drug use: Not Currently   Sexual activity: Not on file  Other Topics Concern   Not on file  Social History Narrative   Not on file   Social Determinants of Health   Financial Resource Strain: Low Risk    Difficulty of Paying Living Expenses: Not very hard  Food Insecurity: No Food Insecurity   Worried About Running Out of Food in the Last Year: Never true   Ran Out of Food in the Last Year: Never true  Transportation Needs: No Transportation Needs   Lack of Transportation (Medical): No   Lack of Transportation (Non-Medical): No  Physical Activity: Not on file  Stress: Not on file  Social Connections: Not on file  Intimate Partner Violence: Not on file   Family History  Problem Relation Age of Onset   Hypertension Father    Coronary artery disease Mother    Hypertension Mother    Hyperlipidemia Mother    Diabetes type II Mother    Breast cancer Mother    Lung cancer Mother    Bipolar disorder Other    Depression Other    Schizophrenia Other    BP 140/80    Pulse 71    Ht 5' 4.5" (1.638 m)    Wt 106.1 kg (234 lb)    SpO2 98%    BMI 39.55 kg/m   Wt Readings from Last 3 Encounters:  07/26/21 106.1 kg (234 lb)  07/12/21 103.3 kg (227 lb 12.8 oz)  07/12/21 103 kg (227 lb)   PHYSICAL EXAM: General:  Diaphoretic due to hypoglycemic event. No resp difficulty HEENT: normal Neck: supple. no JVD. Carotids 2+ bilat; no bruits. No lymphadenopathy or thryomegaly appreciated. Cor: PMI nondisplaced.  Regular rate & rhythm. No rubs, gallops or murmurs. Lungs: clear Abdomen: obese soft, nontender, nondistended. No hepatosplenomegaly. No bruits or masses. Good bowel sounds. Extremities: no cyanosis, clubbing, rash, 1+ edema Neuro: alert & orientedx3, cranial nerves grossly intact. moves all 4 extremities w/o difficulty. Affect pleasant   Wt Readings from Last 3 Encounters:  07/26/21 106.1 kg (234 lb)  07/12/21 103.3 kg (227 lb 12.8 oz)  07/12/21 103 kg (227 lb)     CardioMEMs: Goal dPAP: 10  Today's dPAP: 10 Personally reviewed  ASSESSMENT & PLAN: 1.  Chronic Diastolic Heart Failure - Echo 08/2020 EF 55-60%  - Echo (3/22): EF 55-60%, Grade II DD, normal RV - Echo (6/22): EF 60%, mod MR/TR (per chart review at [facility]). - RHC (10/22): mild to moderate elevated filling pressures, large v-waves PCWP tracing - TEE (10/22): EF 55-60%, basal inferior akinesis, severe central MR. - Recent admit 10/22 with a/c CHF, enrolled in FASTR trial and diuresed 15 lbs. Severe MR likely contributing to decompensation.  - Echo (11/22): EF 50-55%, severe LV HK, moderate MR - Stable  NYHA III. Has been doing well with Cardiac Rehab.  - Weight is up 7 pounds and has some edema on exam but CardioMems readings look ok. I suspect she is mildly overloaded. Double torsemide x 2 days - Continue torsemide 60 mg daily. - Continue carvedilol 12.5 mg bid. - Continue hydralazine 25 mg tid + Imdur 30 mg daily. - No SGLT2i with T1DM. - Continue TED hose. - Labs today   2. CAD - Known CAD with prior stents to mid LAD and pRCA.  - NSTEMI (2/22). Cath deferred due to AKI. Treated medically.  - NSTEMI (7/22)-->S/p PCI/DES to mid Cx, balloon angioplasty to OM1  - No s/s angina - Ideally continue ASA + Plavix for a minimum of 12 months post DES. - Lipids under good control w/ Repatha + Zetia (LDL 66 7/22). Followed by Dr. Debara Pickett.    3. CKD IIIb - Prior h/o CIN following LHC - Previous SCr baseline 1.6-1.8 with  multiple AKIs over the last year. New baseline ~ 2.4  - Followed [facility]phrology in [facility] - SCr 2.32 07/11/21 - Repeat labs today   4. Mitral regurgitation - Severe central MR on TEE on 10/22.   - S/p TEER 05/18/2021 - two clips placed, one became entangled and had to be deployed. - Moderate residual MR on echo 11/22 - Repeat echo 12/22 with stable moderate MR - Structural team following for potential re-clipping of MV   5. OSA -  CPAP qhs.    6. DM1 - Hgb A1c 6.6 10/22. - No SGLT2i.   7. Chronic Anemia - Baseline 8-9, likely 2/2 renal disease.  - Hx severe epistaxis and ABLA requiring transfusion - Receives Procrit through nephrology - Hx rash with IV iron - Repeat labs today  8. Palpitations - Hx PAF s/p ablation at least 10 years ago. No documented recurrence. Not currently on anticoagulation. Would need to consider risk/benefits of anticoagulation if recurs especially with history of anemia requiring transfusion - Zio XT 14 day (8/22) showed mostly SB/SR no afib, 66 SVT runs, frequent isolated SVE (11.2%). - Symptomatically improved - Continue carvedilol 12.5 mg bid.   [name], MD  4:02 PM

## 2021-07-26 NOTE — Patient Instructions (Addendum)
Labs done today. We will contact you only if your labs are abnormal.  Double your torsemide for 2 days.   No other medication changes were made. Please continue all current medications as prescribed.  Your physician recommends that you schedule a follow-up appointment in: 2 months with our NP/PA Clinic here in our office.   If you have any questions or concerns before your next appointment please send [name] a message through [place] or call our office at [phone].    TO LEAVE A MESSAGE FOR THE NURSE SELECT OPTION 2, PLEASE LEAVE A MESSAGE INCLUDING: YOUR NAME DATE OF BIRTH CALL BACK NUMBER REASON FOR CALL**this is important as we prioritize the call backs  YOU WILL RECEIVE A CALL BACK THE SAME DAY AS LONG AS YOU CALL BEFORE 4:00 PM   Do the following things EVERYDAY: Weigh yourself in the morning before breakfast. Write it down and keep it in a log. Take your medicines as prescribed Eat low salt foods--Limit salt (sodium) to 2000 mg per day.  Stay as active as you can everyday Limit all fluids for the day to less than 2 liters   At the [facility], you and your health needs are our priority. As part of our continuing mission to provide you with exceptional heart care, we have created designated Provider Care Teams. These Care Teams include your primary Cardiologist (physician) and Advanced Practice Providers (APPs- Physician Assistants and Nurse Practitioners) who all work together to provide you with the care you need, when you need it.   You may see any of the following providers on your designated Care Team at your next follow up: Dr [name] Dr Haynes Kerns, NP Lyda Jester, [place] Audry Riles, PharmD   Please be sure to bring in all your medications bottles to every appointment.

## 2021-07-27 ENCOUNTER — Ambulatory Visit (INDEPENDENT_AMBULATORY_CARE_PROVIDER_SITE_OTHER): Payer: HMO

## 2021-07-27 NOTE — Progress Notes (Signed)
Chronic Care Management Pharmacy Note  07/27/2021 Name:  Dana [name] MRN:  [id number] DOB:  July 30, 1960   Plan Updates:  Onboarded patient to Pharmacy  Subjective: Dana [name] is an 61 y.o. year old female who is a primary patient of Cox, Kirsten, MD.  The CCM team was consulted for assistance with disease management and care coordination needs.    Engaged with patient by telephone for follow up visit in response to provider referral for pharmacy case management and/or care coordination services.   Consent to Services:  The patient was given the following information about Chronic Care Management services today, agreed to services, and gave verbal consent: 1. CCM service includes personalized support from designated clinical staff supervised by the primary care provider, including individualized plan of care and coordination with other care providers 2. 24/7 contact phone numbers for assistance for urgent and routine care needs. 3. Service will only be billed when office clinical staff spend 20 minutes or more in a month to coordinate care. 4. Only one practitioner may furnish and bill the service in a calendar month. 5.The patient may stop CCM services at any time (effective at the end of the month) by phone call to the office staff. 6. The patient will be responsible for cost sharing (co-pay) of up to 20% of the service fee (after annual deductible is met). Patient agreed to services and consent obtained.  Patient Care Team: [name], MD as PCP - General (Family Medicine) Bensimhon, Shaune Pascal, MD as PCP - Cardiology (Cardiology) Bensimhon, Shaune Pascal, MD as PCP - Advanced Heart Failure (Cardiology) Kennith Gain, MD as Consulting Physician (Allergy) Bensimhon, Shaune Pascal, MD as Consulting Physician (Cardiology) Scherrie November, MD as Referring Physician (Gastroenterology) Adegoroye, Wynona Luna, MD as Consulting Physician (Nephrology) Alanda Slim, Neena Rhymes, MD as Consulting  Physician (Ophthalmology) Katheran James., MD as Consulting Physician (Endocrinology) Sharyn Dross., DPM as Consulting Physician (Podiatry) Hennie Duos, MD as Consulting Physician (Rheumatology) Biagio Quint, MD as Referring Physician (Nephrology) Biagio Quint, MD as Referring Physician (Nephrology) Lane Hacker, [facility] as Pharmacist (Pharmacist) Lane Hacker, [facility] (Pharmacist)  Recent office visits: 10/17/2020 - LDL is elevated.  109.  Goal should be less than 70 if not less than 55.  Please discussed with patient.  I also thought she might have been on Repatha or Praluent through the cardiologist.Kidney dysfunction is stable. Sugar is 54.Anemia is stable.  This is multifactorial. Labs are concerning for B12 deficiency.  I would recommend over-the-counter B12 sublingual 2500 mg once daily.  PTH is elevated which is secondary hyperparathyroidism secondary to kidney dysfunction 09/26/2020 - pursue catherization as kidneys improve. Keep appt with nephroloty. Recent consult visits: 10/25/2020 - cardiology - continue torsemide daily. Continue CPAP nightly.  10/19/2020 - Cardiology - good response to Repatha. Check LP(a) with next lipid panel. No medication changes.  10/13/2020 - nephrology - consider resuming RAAS blocker once creatinine stabilizes for protein in urine.  Hospital visits:   09/29/2020 - ED to hospital admission - ACS and AKI.   Objective:  Lab Results  Component Value Date   CREATININE 2.29 (H) 07/26/2021   BUN 50 (H) 07/26/2021   GFRNONAA 24 (L) 07/26/2021   GFRAA 43 (L) 06/28/2020   NA 139 07/26/2021   K 4.5 07/26/2021   CALCIUM 9.0 07/26/2021   CO2 28 07/26/2021   GLUCOSE 75 07/26/2021    Lab Results  Component Value Date/Time   HGBA1C 6.6 (H) 04/25/2021 12:20 AM  HGBA1C 6.8 (H) 09/30/2020 02:41 AM    Last diabetic Eye exam:  Lab Results  Component Value Date/Time   HMDIABEYEEXA Retinopathy (A) 06/06/2020 12:00 AM    Last diabetic  Foot exam: No results found for: HMDIABFOOTEX   Lab Results  Component Value Date   CHOL 145 07/11/2021   HDL 48 07/11/2021   LDLCALC 80 07/11/2021   TRIG 92 07/11/2021   CHOLHDL 3.0 07/11/2021    Hepatic Function Latest Ref Rng & Units 07/26/2021 07/11/2021 05/25/2021  Total Protein 6.5 - 8.1 g/dL 7.6 7.0 7.4  Albumin 3.5 - 5.0 g/dL 3.4(L) 4.2 3.8  AST 15 - 41 U/L 18 15 16   ALT 0 - 44 U/L 15 9 16   Alk Phosphatase 38 - 126 U/L 125 137(H) 145(H)  Total Bilirubin 0.3 - 1.2 mg/dL 0.5 0.4 0.5  Bilirubin, Direct 0.0 - 0.2 mg/dL - - -    Lab Results  Component Value Date/Time   TSH 4.014 04/24/2021 12:50 PM   TSH 3.590 03/23/2020 11:01 AM    CBC Latest Ref Rng & Units 07/26/2021 07/11/2021 06/19/2021  WBC 4.0 - 10.5 K/uL 12.6(H) 10.4 8.3  Hemoglobin 12.0 - 15.0 g/dL 8.2(L) 8.5(L) 7.9(L)  Hematocrit 36.0 - 46.0 % 27.1(L) 27.1(L) 24.5(L)  Platelets 150 - 400 K/uL 319 238 240    Lab Results  Component Value Date/Time   VD25OH 26.0 (L) 02/28/2021 02:09 PM    Clinical ASCVD: Yes  The ASCVD Risk score (Arnett DK, et al., 2019) failed to calculate for the following reasons:   The patient has a prior MI or stroke diagnosis    Depression screen [facility] 2/9 05/09/2021 02/13/2021 02/13/2021  Decreased Interest 0 0 0  Down, Depressed, Hopeless 0 0 0  PHQ - 2 Score 0 0 0  Altered sleeping - 2 -  Tired, decreased energy - 2 -  Change in appetite - 0 -  Feeling bad or failure about yourself  - 0 -  Trouble concentrating - 1 -  Moving slowly or fidgety/restless - 0 -  Suicidal thoughts - 0 -  PHQ-9 Score - 5 -  Difficult doing work/chores - Somewhat difficult -  Some recent data might be hidden      Social History   Tobacco Use  Smoking Status Former  Smokeless Tobacco Never   BP Readings from Last 3 Encounters:  07/26/21 140/80  07/12/21 (!) 106/54  07/12/21 (!) 100/42   Pulse Readings from Last 3 Encounters:  07/26/21 71  07/12/21 73  07/12/21 72   Wt Readings from Last 3  Encounters:  07/26/21 234 lb (106.1 kg)  07/12/21 227 lb 12.8 oz (103.3 kg)  07/12/21 227 lb (103 kg)   BMI Readings from Last 3 Encounters:  07/26/21 39.55 kg/m  07/12/21 38.50 kg/m  07/12/21 38.36 kg/m    Assessment/Interventions: Review of patient past medical history, allergies, medications, health status, including review of consultants reports, laboratory and other test data, was performed as part of comprehensive evaluation and provision of chronic care management services.   SDOH:  (Social Determinants of Health) assessments and interventions performed: Yes  SDOH Screenings   Alcohol Screen: Low Risk    Last Alcohol Screening Score (AUDIT): 0  Depression (PHQ2-9): Low Risk    PHQ-2 Score: 0  Financial Resource Strain: Low Risk    Difficulty of Paying Living Expenses: Not very hard  Food Insecurity: No Food Insecurity   Worried About Running Out of Food in the Last Year: Never true  Ran Out of Food in the Last Year: Never true  Housing: Low Risk    Last Housing Risk Score: 0  Physical Activity: Not on file  Social Connections: Not on file  Stress: Not on file  Tobacco Use: Medium Risk   Smoking Tobacco Use: Former   Smokeless Tobacco Use: Never   Passive Exposure: Not on file  Transportation Needs: No Transportation Needs   Lack of Transportation (Medical): No   Lack of Transportation (Non-Medical): No    CCM Care Plan  Allergies  Allergen Reactions   Naproxen Hives and Rash   Celecoxib Other (See Comments)    Stomach pain   Aspirin Other (See Comments)    CAN NOT TAKE DUE TO ULCERS (documented previously) but takes every day currently (as of 09/29/20) without reported issues Can take coated aspirin   Glucosamine Hives, Swelling and Other (See Comments)    Angioedema    Iron     IV iron transfusion - hives - Feb 2022 hospitalization   Metoclopramide Other (See Comments)    Facial twitching and stuttering   Metolazone Other (See Comments)    Acute  renal failure   Shellfish-Derived Products Hives, Swelling and Other (See Comments)    Angioedema    Statins Other (See Comments)    Muscle pain   Tramadol Nausea And Vomiting         Medications Reviewed Today     Reviewed by Lane Hacker, [facility] (Pharmacist) on 07/27/21 at 1021  Med List Status: <None>   Medication Order Taking? Sig Documenting Provider Last Dose Status Informant  allopurinol (ZYLOPRIM) 100 MG tablet [id number] Yes Take 1 tablet (100 mg total) by mouth daily. Cox, Kirsten, MD Taking Active   amoxicillin (AMOXIL) 500 MG capsule [id number] No TAKE FOUR CAPSULES BY MOUTH ONE HOUR BEFORE DENTAL PROCEDURES AND CLEANINGS  Patient not taking: Reported on 07/27/2021   [provider] Not Taking Consider Medication Status and Discontinue   aspirin EC 81 MG EC tablet [id number] Yes Take 1 tablet (81 mg total) by mouth daily. Swallow whole. Ledora Bottcher, PA Taking Active   BD PEN NEEDLE NANO 2ND GEN 32G X 4 MM MISC [id number] Yes 4 (four) times daily. [provider] Taking Active   busPIRone (BUSPAR) 5 MG tablet [id number] Yes Take 1 tablet (5 mg total) by mouth 3 (three) times daily. Cox, Kirsten, MD Taking Active   carvedilol (COREG) 12.5 MG tablet [id number] Yes Take 1 tablet (12.5 mg total) by mouth 2 (two) times daily. Arpelar, Maricela Bo, FNP Taking Active   Cholecalciferol (VITAMIN D) 50 MCG (2000 UT) tablet [id number] Yes Take 2,000 Units by mouth daily. Take one daily [provider] Taking Active   clopidogrel (PLAVIX) 75 MG tablet [id number] Yes Take 1 tablet (75 mg total) by mouth at bedtime. Rafael Bihari, [place] Taking Active   Cyanocobalamin 3000 MCG CAPS [id number] Yes Take 2 capsules by mouth daily. Patient purchased 3000 mcg gummies and taking 2 gummies daily [provider] Taking Active   EPINEPHrine 0.3 mg/0.3 mL IJ SOAJ injection [id number]  Use as directed for life-threatening allergic reaction. Kennith Gain,  MD  Active   Evolocumab [facility] SURECLICK) 798 MG/ML Darden Palmer [id number] Yes Inject 1 Dose into the skin every 14 (fourteen) days. Pixie Casino, MD Taking Active   ezetimibe (ZETIA) 10 MG tablet [id number] Yes Take 1 tablet (10 mg total) by mouth daily. Pixie Casino, MD Taking Active   ferrous sulfate 325 (  65 FE) MG tablet [id number] Yes Take 325 mg by mouth 2 (two) times daily with a meal. [provider] Taking Active   hydrALAZINE (APRESOLINE) 25 MG tablet [id number] Yes TAKE 1 TABLET BY MOUTH THREE TIMES DAILY Joette Catching, PA-C Taking Active   HYDROcodone-acetaminophen (NORCO) 7.5-325 MG tablet [id number] Yes Take 1 tablet by mouth 3 (three) times daily as needed for moderate pain. Cox, Kirsten, MD Taking Active   insulin aspart (NOVOLOG) 100 UNIT/ML injection [id number] Yes Inject 2-10 Units into the skin 3 (three) times daily before meals. [provider] Taking Active   isosorbide mononitrate (IMDUR) 30 MG 24 hr tablet [id number] Yes Take 1 tablet (30 mg total) by mouth daily. Ledora Bottcher, PA Taking Active   lactulose [facility]) 10 GM/15ML solution [id number] Yes Take 15 mLs (10 g total) by mouth daily as needed for mild constipation. Lillard Anes, MD Taking Active   latanoprost (XALATAN) 0.005 % ophthalmic solution [id number] Yes Place 1 drop into both eyes at bedtime. [provider] Taking Active   LEVEMIR FLEXTOUCH 100 UNIT/ML FlexPen [id number] Yes Inject 50 Units into the skin daily. [provider] Taking Active            Med Note Wilmon Pali, MELISSA R   Wed May 10, 2021  4:34 PM)    LORazepam (ATIVAN) 0.5 MG tablet [id number] Yes Take 1 tablet (0.5 mg total) by mouth daily as needed for anxiety. Cox, Kirsten, MD Taking Active   magnesium oxide (MAG-OX) 400 MG tablet [id number] Yes Take 2 tablets (800 mg total) by mouth daily. Take 2 (400 mg) daily=800 mg Shirley Friar, PA-C Taking Active   nitroGLYCERIN (NITROSTAT) 0.4 MG  SL tablet [id number] Yes Place 1 tablet (0.4 mg total) under the tongue every 5 (five) minutes as needed for chest pain. Eileen Stanford, PA-C Taking Active   Omega-3 Fatty Acids (FISH OIL PO) [id number] Yes Take 1 capsule by mouth daily. [provider] Taking Active   OXYGEN [id number]  Inhale 2 L into the lungs at bedtime. [provider]  Active   pantoprazole (PROTONIX) 40 MG tablet [id number] Yes Take 1 tablet (40 mg total) by mouth daily. Cox, Kirsten, MD Taking Active   potassium chloride SA (KLOR-CON) 20 MEQ tablet [id number] Yes Take 1 tablet (20 mEq total) by mouth daily. Ledora Bottcher, PA Taking Active   Probiotic CAPS [id number]  Take 1 capsule by mouth daily. [provider]  Active   promethazine (PHENERGAN) 25 MG tablet [id number] Yes Take 1 tablet (25 mg total) by mouth every 6 (six) hours as needed for nausea or vomiting. Cox, Kirsten, MD Taking Active   torsemide (DEMADEX) 20 MG tablet [id number]  Take 3 tablets (60 mg total) by mouth daily. Joette Catching, PA-C  Active   Med List Note Modena Nunnery, [place] T, [place] 03/04/18 1416): CPAP            Patient Active Problem List   Diagnosis Date Noted   UTI (urinary tract infection) 07/11/2021   GAD (generalized anxiety disorder) 07/11/2021   Mixed hyperlipidemia 07/11/2021   Severe mitral regurgitation 05/18/2021   S/P mitral valve clip implantation 05/18/2021   Anemia due to stage 4 chronic kidney disease ([place]) 04/04/2021   Cognitive communication deficit 02/13/2021   History of cardiac arrest 02/06/2021   Right hand paresthesia 01/25/2021   Bilateral lower extremity edema 01/23/2021   Anoxic brain injury ([place]) 01/20/2021   Impaired mobility and ADLs 01/20/2021  Left-sided weakness 01/20/2021   Memory deficits 01/20/2021   Statin myopathy 01/03/2021   Secondary hyperparathyroidism of renal origin ([place]) 11/18/2020   Vitamin D deficiency 11/18/2020   Proteinuria 10/13/2020   Stage 3b  chronic kidney disease ([place]) 10/13/2020   Chest pain 09/29/2020   Contrast dye induced nephropathy, possible 09/03/2020   OSA on CPAP 09/03/2020   Need for COVID-19 vaccine 06/28/2020   Medicare annual wellness visit, subsequent 06/13/2020   Chronic respiratory failure with hypoxia ([place]) 03/23/2020   Chronic gout without tophus 03/23/2020   Pain in both hands 42/39/5320   Uncomplicated opioid dependence ([place]) 03/23/2020   Depression, major, recurrent, mild ([place]) 12/21/2019   Hypomagnesemia 12/21/2019   Diarrhea 12/21/2019   Lumbar back pain 10/05/2019   Diabetic nephropathy associated with type 2 diabetes mellitus ([place]) 10/05/2019   Hypertensive heart and renal disease with congestive heart failure ([place]) 09/18/2019   Demand ischemia (HCC)    Diastolic heart failure ([place]) 11/07/2017   Morbid obesity with BMI of 45.0-49.9, adult ([place]) 11/07/2017   Coronary artery disease involving native coronary artery of native heart without angina pectoris 12/25/2016   Hyperlipidemia LDL goal <70 12/14/2016   Anemia 03/23/2016   Chronic pain syndrome 03/23/2016   COPD (chronic obstructive pulmonary disease) ([place]) 03/23/2016   GERD without esophagitis 03/23/2016   On home oxygen therapy 03/23/2016   S/P ablation of atrial fibrillation 10/04/2015   Diabetic polyneuropathy associated with type 2 diabetes mellitus ([place]) 04/02/2014    Immunization History  Administered Date(s) Administered   Influenza Inj Mdck Quad Pf 03/23/2020, 04/04/2021   Influenza, Quadrivalent, Recombinant, Inj, Pf 05/21/2018   Influenza,inj,Quad PF,6+ Mos 03/28/2015   Influenza-Unspecified 04/15/2014, 05/16/2016, 03/12/2019   Moderna Covid-19 Vaccine Bivalent Booster 59yr & up 05/09/2021   Moderna SARS-COV2 Booster Vaccination 06/28/2020   Pneumococcal Conjugate-13 05/16/2014, 06/14/2014   Pneumococcal Polysaccharide-23 05/18/2018    Conditions to be addressed/monitored:  Hyperlipidemia, Diabetes, Heart Failure and  Coronary Artery Disease  Care Plan : [place] Updates made by KLane Hacker RPH since 07/27/2021 12:00 AM     Problem: chf, hld, dm   Priority: High  Onset Date: 11/17/2020     Long-Range Goal: Disease State Management   Start Date: 11/17/2020  Expected End Date: 11/17/2021  Recent Progress: On track  Priority: High  Note:     Current Barriers:  Unable to maintain control of diabetes  Pharmacist Clinical Goal(s):  Patient will maintain control of diabetes as evidenced by a1c and avoiding low blood sugar readings  through collaboration with PharmD and provider.   Interventions: 1:1 collaboration with CRochel Brome MD regarding development and update of comprehensive plan of care as evidenced by provider attestation and co-signature Inter-disciplinary care team collaboration (see longitudinal plan of care) Comprehensive medication review performed; medication list updated in electronic medical record  CAD: (LDL goal < 55) -Managed Daniel Bensimhon The ASCVD Risk score (Arnett DK, et al., 2019) failed to calculate for the following reasons:   The patient has a prior MI or stroke diagnosis Lab Results  Component Value Date   CHOL 175 10/17/2020   CHOL 101 09/30/2020   CHOL 121 06/28/2020   Lab Results  Component Value Date   HDL 43 10/17/2020   HDL 42 09/30/2020   HDL 51 06/28/2020   Lab Results  Component Value Date   LDLCALC 109 (H) 10/17/2020   LDLCALC 44 09/30/2020   LDLCALC 51 06/28/2020   Lab Results  Component Value Date  TRIG 130 10/17/2020   TRIG 77 09/30/2020   TRIG 100 06/28/2020   Lab Results  Component Value Date   CHOLHDL 4.1 10/17/2020   CHOLHDL 2.4 09/30/2020   CHOLHDL 2.4 06/28/2020  No results found for: LDLDIRECT -Not ideally controlled -Current treatment: Repatha sureclick every 2 weeks ([place] approved 07/27/21) Appropriate, Effective, Safe, Accessible Ezetimibe 70m Appropriate, Effective, Safe,  Accessible Clopidogrel 755m(Dual Therapy 12 months post DES on 02/03/21) Appropriate, Effective, Safe, Accessible ASA 8148mDual Therapy 12 months post DES on 02/03/21) Appropriate, Effective, Safe, Accessible -Medications previously tried: pravastatin  -Current dietary patterns: working on heaMirantth dietician through insurance -Current exercise habits: cardiac rehab. [facility].  -Educated on Cholesterol goals;  Importance of limiting foods high in cholesterol; Exercise goal of 150 minutes per week; -Counseled on diet and exercise extensively Recommended to continue current medication  Diabetes (A1c goal <7%) Lab Results  Component Value Date   HGBA1C 6.6 (H) 04/25/2021   HGBA1C 6.8 (H) 09/30/2020   HGBA1C 6.1 (H) 08/30/2020   Lab Results  Component Value Date   LDLCALC 109 (H) 10/17/2020   CREATININE 2.46 (H) 05/19/2021   Lab Results  Component Value Date   NA 133 (L) 05/19/2021   K 4.6 05/19/2021   CREATININE 2.46 (H) 05/19/2021   EGFR 22 (L) 05/09/2021   GFRNONAA 22 (L) 05/19/2021   GLUCOSE 215 (H) 05/19/2021   Lab Results  Component Value Date   WBC 14.0 (H) 05/19/2021   HGB 7.5 (L) 05/19/2021   HCT 23.9 (L) 05/19/2021   MCV 85.7 05/19/2021   PLT 246 05/19/2021  -Controlled -Current medications: Levemir 50 units daily Appropriate, Effective, Safe, Accessible Novolog 2-10 units tid before meals Appropriate, Effective, Safe, Accessible -Medications previously tried:  farxiga -Current home glucose readings fasting glucose:  May 2022: 99-105  November 2022: 05/25/21: 11 post prandial glucose: well controlled only low is when she gets busy and eats too late after her meal-time insulin -Reports hypoglycemic/hyperglycemic symptoms - 2 times in the past few weeks when she got busy and didn't eat as quickly -Current meal patterns:  breakfast:  Watching diet and improving choices for heart healthy,  lower sodium and lower blood sugar lunch:  not overly hungry at lunch time but does try to eat  dinner: avoiding fried foods, more fish (2-3 times weekly), baked chicken, utilizing portion control, not eating pizza since heart attack snacks: 1/2 peanut butter sandwich, has cut back  drinks:  has cut out diet coke -Current exercise: cardiac rehab -Educated on A1c and blood sugar goals; Exercise goal of 150 minutes per week; Prevention and management of hypoglycemic episodes; Carbohydrate counting and/or plate method -Counseled to check feet daily and get yearly eye exams -Counseled on diet and exercise extensively Recommended to continue current medication. Patient started 3000 mcg daily of vitamin b-12 2 gummies daily. Working with dietician and has been losing some weight.   Heart Failure (Goal: manage symptoms and prevent exacerbations) -Managed Dr. BenHaroldine Lawsronic Diastolic HF - Echo 09/11/5914 55-60%  - Echo (3/22): EF 55-60%, Grade II DD, normal RV - Echo (6/22): EF 60%, mod MR/TR ([facility]- [facility] (10/22): mild to moderate elevated filling pressures, large v-waves PCWP tracing - TEE (10/22): EF 55-60%, basal inferior akinesis, severe central MR. - Recent admit 10/22 with a/c CHF, enrolled in FASTR trial and diuresed 15 lbs. Severe MR likely contributing to decompensation.  -  Echo (11/22): EF 50-55%, severe LV HK, moderate MR - Stable NYHA III. Has been doing well with Cardiac Rehab. BP Readings from Last 3 Encounters:  05/19/21 (!) 108/49  05/16/21 (!) 126/53  05/09/21 (!) 134/58  -Controlled -Current treatment: Torsemide 20 mg 2 tablets daily Appropriate, Effective, Safe, Accessible Carvedilol 12.5 mg bid Appropriate, Effective, Safe, Accessible Hydralazine 25 mg tid Appropriate, Effective, Safe, Accessible Isosorbide mn 30 mg daily Appropriate, Effective, Safe, Accessible -Weight: (Weighs herself everyday) Normal: 232 + 2 pounds 05/25/21: 232 BP:  (Tests daily) 05/25/21: 132/57 07/27/21: Didn't have reading today -Medications previously tried:  Metolazone, spironolactone,  -Current dietary habits:  watching sodium  -Current exercise habits:  cardiac rehab currently  -Educated on Benefits of medications for managing symptoms and prolonging life Importance of blood pressure control -Counseled on diet and exercise extensively Recommended to continue current medication. Wearing support stockings every day.    Patient Goals/Self-Care Activities Patient will:  - take medications as prescribed focus on medication adherence by using pill box  check glucose 3-4 times daily, document, and provide at future appointments target a minimum of 150 minutes of moderate intensity exercise weekly engage in dietary modifications by limiting carbohydrates, sodium and fatty foods  Follow Up Plan: Telephone follow up appointment with care management team member scheduled for: June 2023      Medication Assistance: Patient has grant to cover cost of [place].   Patient's preferred pharmacy is:  [facility] [address] [place] [address] [place] [address] Phone: [phone] Fax: [phone]  [facility] - [place], [place] [address] [place] [address] Phone: [phone] Fax: [phone]  [facility] - [place], [place] - [address] [place] [address] Phone: [phone] Fax: [phone]   Uses pill box? Yes Pt endorses good compliance  We discussed: Current pharmacy is preferred with insurance plan and patient is satisfied with pharmacy services Patient decided to: Utilize [facility] for medication synchronization, packaging and delivery Verbal consent obtained for [facility] enhanced pharmacy services (medication synchronization, adherence  packaging, delivery coordination). A medication sync plan was created to allow patient to get all medications delivered once every 30 to 90 days per patient preference. Patient understands they have freedom to choose pharmacy and clinical pharmacist will coordinate care between all prescribers and [facility].  Medication Name                        (please note if Rx is PRN) Prescriber                                                                  (list Provider Name & Phone Number)                                  Timing    Refill Timing Last Fill Date & DS       (if last fill/DS unavailable, list pt.'s quantity on hand) Anticipated next due date    BB B L EM BT   Days  Supply   Allopurinol 18m QD Cox [phone]     Cycle Fill 06/12/2021 90.00 09/10/21  ASA 851mQD Cox [phone]    Cycle Fill 07/30/2021 90.00 10/28/21  BD Needles [id number] [phone]   Cycle Fill 07/27/2021 30.00 08/26/21  Buspirone 5mg48mD Cox [phone]  Cycle Fill 07/11/2021 30.00 08/10/21  Carvedilol 12.5mg 49m DanieGlori Bickers-[id number] Cycle Fill 06/14/2021 90.00 09/12/21  Cholecalciferol 50mcg65mCox [phone]     Clopidogrel 75mg H26mniel Bensimhon  [id number] Fill 06/05/2021 90.00 09/03/21  Repatha 140mg/2 62ms Daniel BGlori [id number] Fill 07/14/2021 84.00 10/06/21  Ezetimibe 10mg QD 33mel BeGlori Bickers-[id number] Fill 06/22/2021 90.00 09/20/21  Ferrous Sulfate 325mg BID [id number]    Hydralazine 25mg TID D4ml BensGlori [id number] Fill 07/28/2021 90.00 10/26/21  Hydrocodone/APAP 7.5mg/325mg T25mPRN Already synched with [facility]      Already synched with [facility] Already synched with [facility] Already synched with [facility]   ASPART 2-10 units TID  William SmitIran [id number] 07/03/2021 43.00 08/15/21  ISMN 30mg QD Dani64mensimGlori Bickers2 [phone] 07/27/2021 90.00 10/25/21   Cyanocobalamin 3000mcg BID [phone]  [phone] Oil BID  [facility] [phone]  [phone] 50 units QD William Smith [place] Planas [phone] 07/27/2021 38.00 09/03/21  Lorazepam 0.5mg QD PRN [phone]   [phone] 30.00 08/29/20  Lactulose 10gm/15ml PRN [facility] 3335m9-6500    [phone] 16.00 03/01/21  Mag Oxi 400mg 2QD Cox 33659m-6500     [phone] SL Daniel Bensimhon  [id number]     [phone] 25.00 07/11/21  Pantoprazole 40mg [facility] 336-629-16m      [id number]/30/2022 90.00 09/12/21  KCl 20mEq [facility] 336-629-17m      [id number]/2023 90.00 10/26/21              Latanoprost eye drops [facility] from [facility] [facility] [id number] 90.00 09/12/21  Promethazine 25mg QID PRN [facility] 3376m9-6500      [id number] 23.00 05/27/21  Torsemide 20mg 3QD Daniel Bens67mn  336Glori Bickersyc768-115-72621/2022 30.00 08/14/21    Care Plan and Follow Up Patient Decision:  Patient agrees to Care Plan and Follow-up.  Plan: Telephone follow up appointment with care management team member scheduled for:  June 2023

## 2021-07-27 NOTE — Patient Instructions (Signed)
Visit Information   Goals Addressed   None    Patient Care Plan: CCM Pharmacy Care Plan     Problem Identified: chf, hld, dm   Priority: High  Onset Date: 11/17/2020     Long-Range Goal: Disease State Management   Start Date: 11/17/2020  Expected End Date: 11/17/2021  Recent Progress: On track  Priority: High  Note:     Current Barriers:  Unable to maintain control of diabetes  Pharmacist Clinical Goal(s):  Patient will maintain control of diabetes as evidenced by a1c and avoiding low blood sugar readings  through collaboration with PharmD and provider.   Interventions: 1:1 collaboration with [name], MD regarding development and update of comprehensive plan of care as evidenced by provider attestation and co-signature Inter-disciplinary care team collaboration (see longitudinal plan of care) Comprehensive medication review performed; medication list updated in electronic medical record  CAD: (LDL goal < 55) -Managed Daniel Bensimhon The ASCVD Risk score (Arnett DK, et al., 2019) failed to calculate for the following reasons:   The patient has a prior MI or stroke diagnosis Lab Results  Component Value Date   CHOL 175 10/17/2020   CHOL 101 09/30/2020   CHOL 121 06/28/2020   Lab Results  Component Value Date   HDL 43 10/17/2020   HDL 42 09/30/2020   HDL 51 06/28/2020   Lab Results  Component Value Date   LDLCALC 109 (H) 10/17/2020   LDLCALC 44 09/30/2020   LDLCALC 51 06/28/2020   Lab Results  Component Value Date   TRIG 130 10/17/2020   TRIG 77 09/30/2020   TRIG 100 06/28/2020   Lab Results  Component Value Date   CHOLHDL 4.1 10/17/2020   CHOLHDL 2.4 09/30/2020   CHOLHDL 2.4 06/28/2020  No results found for: LDLDIRECT -Not ideally controlled -Current treatment: Repatha sureclick every 2 weeks ([place] approved 07/27/21) Appropriate, Effective, Safe, Accessible Ezetimibe 35m Appropriate, Effective, Safe, Accessible Clopidogrel 714m(Dual  Therapy 12 months post DES on 02/03/21) Appropriate, Effective, Safe, Accessible ASA 8164mDual Therapy 12 months post DES on 02/03/21) Appropriate, Effective, Safe, Accessible -Medications previously tried: pravastatin  -Current dietary patterns: working on heaMirantth dietician through insurance -Current exercise habits: cardiac rehab. [facility].  -Educated on Cholesterol goals;  Importance of limiting foods high in cholesterol; Exercise goal of 150 minutes per week; -Counseled on diet and exercise extensively Recommended to continue current medication  Diabetes (A1c goal <7%) Lab Results  Component Value Date   HGBA1C 6.6 (H) 04/25/2021   HGBA1C 6.8 (H) 09/30/2020   HGBA1C 6.1 (H) 08/30/2020   Lab Results  Component Value Date   LDLCALC 109 (H) 10/17/2020   CREATININE 2.46 (H) 05/19/2021   Lab Results  Component Value Date   NA 133 (L) 05/19/2021   K 4.6 05/19/2021   CREATININE 2.46 (H) 05/19/2021   EGFR 22 (L) 05/09/2021   GFRNONAA 22 (L) 05/19/2021   GLUCOSE 215 (H) 05/19/2021   Lab Results  Component Value Date   WBC 14.0 (H) 05/19/2021   HGB 7.5 (L) 05/19/2021   HCT 23.9 (L) 05/19/2021   MCV 85.7 05/19/2021   PLT 246 05/19/2021  -Controlled -Current medications: Levemir 50 units daily Appropriate, Effective, Safe, Accessible Novolog 2-10 units tid before meals Appropriate, Effective, Safe, Accessible -Medications previously tried:  farxiga -Current home glucose readings fasting glucose:  May 2022: 99-105  November 2022: 05/25/21: 11 post prandial glucose: well controlled only  low is when she gets busy and eats too late after her meal-time insulin -Reports hypoglycemic/hyperglycemic symptoms - 2 times in the past few weeks when she got busy and didn't eat as quickly -Current meal patterns:  breakfast:  Watching diet and improving choices for heart healthy, lower sodium and lower blood  sugar lunch:  not overly hungry at lunch time but does try to eat  dinner: avoiding fried foods, more fish (2-3 times weekly), baked chicken, utilizing portion control, not eating pizza since heart attack snacks: 1/2 peanut butter sandwich, has cut back  drinks:  has cut out diet coke -Current exercise: cardiac rehab -Educated on A1c and blood sugar goals; Exercise goal of 150 minutes per week; Prevention and management of hypoglycemic episodes; Carbohydrate counting and/or plate method -Counseled to check feet daily and get yearly eye exams -Counseled on diet and exercise extensively Recommended to continue current medication. Patient started 3000 mcg daily of vitamin b-12 2 gummies daily. Working with dietician and has been losing some weight.   Heart Failure (Goal: manage symptoms and prevent exacerbations) -Managed Dr. [name] Chronic Diastolic HF - Echo 01/4162 EF 55-60%  - Echo (3/22): EF 55-60%, Grade II DD, normal RV - Echo (6/22): EF 60%, mod MR/TR (per chart review at [facility]). - RHC (10/22): mild to moderate elevated filling pressures, large v-waves PCWP tracing - TEE (10/22): EF 55-60%, basal inferior akinesis, severe central MR. - Recent admit 10/22 with a/c CHF, enrolled in FASTR trial and diuresed 15 lbs. Severe MR likely contributing to decompensation.  - Echo (11/22): EF 50-55%, severe LV HK, moderate MR - Stable NYHA III. Has been doing well with Cardiac Rehab. BP Readings from Last 3 Encounters:  05/19/21 (!) 108/49  05/16/21 (!) 126/53  05/09/21 (!) 134/58  -Controlled -Current treatment: Torsemide 20 mg 2 tablets daily Appropriate, Effective, Safe, Accessible Carvedilol 12.5 mg bid Appropriate, Effective, Safe, Accessible Hydralazine 25 mg tid Appropriate, Effective, Safe, Accessible Isosorbide mn 30 mg daily Appropriate, Effective, Safe, Accessible -Weight: (Weighs herself everyday) Normal: 232 + 2 pounds 05/25/21: 232 BP: (Tests daily) 05/25/21:  132/57 07/27/21: Didn't have reading today -Medications previously tried:  Metolazone, spironolactone,  -Current dietary habits:  watching sodium  -Current exercise habits:  cardiac rehab currently  -Educated on Benefits of medications for managing symptoms and prolonging life Importance of blood pressure control -Counseled on diet and exercise extensively Recommended to continue current medication. Wearing support stockings every day.    Patient Goals/Self-Care Activities Patient will:  - take medications as prescribed focus on medication adherence by using pill box  check glucose 3-4 times daily, document, and provide at future appointments target a minimum of 150 minutes of moderate intensity exercise weekly engage in dietary modifications by limiting carbohydrates, sodium and fatty foods  Follow Up Plan: Telephone follow up appointment with care management team member scheduled for: June 2023      The patient verbalized understanding of instructions, educational materials, and care plan provided today and declined offer to receive copy of patient instructions, educational materials, and care plan.  The pharmacy team will reach out to the patient again over the next 60 days.   Lane Hacker, [facility]

## 2021-08-01 ENCOUNTER — Other Ambulatory Visit: Payer: Self-pay

## 2021-08-01 MED ORDER — ISOSORBIDE MONONITRATE ER 30 MG PO TB24
30.0000 mg | ORAL_TABLET | Freq: Every day | ORAL | 3 refills | Status: DC
Start: 2021-08-01 — End: 2021-09-12

## 2021-08-01 MED ORDER — ASPIRIN 81 MG PO TBEC: 81.0000 mg | DELAYED_RELEASE_TABLET | Freq: Every day | ORAL | 3 refills | Status: DC

## 2021-08-01 MED ORDER — LORAZEPAM 0.5 MG PO TABS: 0.5000 mg | ORAL_TABLET | Freq: Every day | ORAL | 2 refills | Status: DC | PRN

## 2021-08-01 NOTE — Telephone Encounter (Signed)
Changing pharmacies.   Dana [name], [place] 08/01/21 11:58 AM

## 2021-08-02 DIAGNOSIS — F411 Generalized anxiety disorder: Secondary | ICD-10-CM | POA: Diagnosis not present

## 2021-08-02 NOTE — Chronic Care Management (AMB) (Signed)
08/02/21- Spoke with pharmacy and pt today and we are adding 3 other meds to her delivery from now on which include isosorbide, aspirin, and lorazepam. Pt requested her delivery date be moved on 1/19 instead of 1/18 and pharmacy has been informed and this was changed at pt request.  Elray Mcgregor, [place] Pharmacist Assistant  [phone]

## 2021-08-09 ENCOUNTER — Encounter (HOSPITAL_COMMUNITY): Payer: Self-pay

## 2021-08-10 DIAGNOSIS — E1040 Type 1 diabetes mellitus with diabetic neuropathy, unspecified: Secondary | ICD-10-CM | POA: Diagnosis not present

## 2021-08-10 DIAGNOSIS — K3184 Gastroparesis: Secondary | ICD-10-CM | POA: Diagnosis not present

## 2021-08-10 DIAGNOSIS — E785 Hyperlipidemia, unspecified: Secondary | ICD-10-CM | POA: Diagnosis not present

## 2021-08-10 DIAGNOSIS — E1043 Type 1 diabetes mellitus with diabetic autonomic (poly)neuropathy: Secondary | ICD-10-CM | POA: Diagnosis not present

## 2021-08-10 DIAGNOSIS — E1059 Type 1 diabetes mellitus with other circulatory complications: Secondary | ICD-10-CM | POA: Diagnosis not present

## 2021-08-10 DIAGNOSIS — I10 Essential (primary) hypertension: Secondary | ICD-10-CM | POA: Diagnosis not present

## 2021-08-10 DIAGNOSIS — Z87891 Personal history of nicotine dependence: Secondary | ICD-10-CM | POA: Diagnosis not present

## 2021-08-15 DIAGNOSIS — E782 Mixed hyperlipidemia: Secondary | ICD-10-CM

## 2021-08-15 DIAGNOSIS — Z794 Long term (current) use of insulin: Secondary | ICD-10-CM

## 2021-08-15 DIAGNOSIS — I2510 Atherosclerotic heart disease of native coronary artery without angina pectoris: Secondary | ICD-10-CM

## 2021-08-15 DIAGNOSIS — I5032 Chronic diastolic (congestive) heart failure: Secondary | ICD-10-CM | POA: Diagnosis not present

## 2021-08-15 DIAGNOSIS — G4733 Obstructive sleep apnea (adult) (pediatric): Secondary | ICD-10-CM | POA: Diagnosis not present

## 2021-08-15 DIAGNOSIS — E1159 Type 2 diabetes mellitus with other circulatory complications: Secondary | ICD-10-CM

## 2021-08-16 DIAGNOSIS — Z9889 Other specified postprocedural states: Secondary | ICD-10-CM | POA: Diagnosis not present

## 2021-08-16 DIAGNOSIS — Z95818 Presence of other cardiac implants and grafts: Secondary | ICD-10-CM | POA: Diagnosis not present

## 2021-08-21 ENCOUNTER — Encounter (HOSPITAL_COMMUNITY): Payer: Self-pay | Admitting: Internal Medicine

## 2021-08-22 ENCOUNTER — Other Ambulatory Visit (HOSPITAL_COMMUNITY): Payer: Self-pay

## 2021-08-22 ENCOUNTER — Other Ambulatory Visit: Payer: Self-pay

## 2021-08-22 DIAGNOSIS — D631 Anemia in chronic kidney disease: Secondary | ICD-10-CM

## 2021-08-22 DIAGNOSIS — N179 Acute kidney failure, unspecified: Secondary | ICD-10-CM

## 2021-08-22 MED ORDER — METOLAZONE 2.5 MG PO TABS: 2.5000 mg | ORAL_TABLET | Freq: Every day | ORAL | 0 refills | Status: DC

## 2021-08-23 ENCOUNTER — Telehealth: Payer: Self-pay

## 2021-08-23 DIAGNOSIS — I340 Nonrheumatic mitral (valve) insufficiency: Secondary | ICD-10-CM

## 2021-08-23 DIAGNOSIS — Z95818 Presence of other cardiac implants and grafts: Secondary | ICD-10-CM

## 2021-08-23 DIAGNOSIS — Z9889 Other specified postprocedural states: Secondary | ICD-10-CM

## 2021-08-23 NOTE — Telephone Encounter (Signed)
Called to arrange MitraClip follow-up echocardiogram and office visit with [name]. Scheduled the patient 3/6 for both appointments. She was grateful for call and agrees with plan.

## 2021-08-25 ENCOUNTER — Other Ambulatory Visit: Payer: Self-pay

## 2021-08-25 ENCOUNTER — Other Ambulatory Visit: Payer: HMO

## 2021-08-25 DIAGNOSIS — D631 Anemia in chronic kidney disease: Secondary | ICD-10-CM

## 2021-08-25 DIAGNOSIS — N186 End stage renal disease: Secondary | ICD-10-CM | POA: Diagnosis not present

## 2021-08-25 DIAGNOSIS — N179 Acute kidney failure, unspecified: Secondary | ICD-10-CM

## 2021-08-26 LAB — RENAL FUNCTION PANEL
Albumin: 4 g/dL (ref 3.8–4.9)
BUN/Creatinine Ratio: 25 — ABNORMAL HIGH (ref 9–23)
BUN: 64 mg/dL — ABNORMAL HIGH (ref 6–24)
CO2: 25 mmol/L (ref 20–29)
Calcium: 8.9 mg/dL (ref 8.7–10.2)
Chloride: 98 mmol/L (ref 96–106)
Creatinine, Ser: 2.61 mg/dL — ABNORMAL HIGH (ref 0.57–1.00)
Glucose: 155 mg/dL — ABNORMAL HIGH (ref 70–99)
Phosphorus: 5.5 mg/dL — ABNORMAL HIGH (ref 3.0–4.3)
Potassium: 5 mmol/L (ref 3.5–5.2)
Sodium: 139 mmol/L (ref 134–144)
eGFR: 21 mL/min/{1.73_m2} — ABNORMAL LOW (ref >59)

## 2021-08-26 LAB — IRON,TIBC AND FERRITIN PANEL
Ferritin: 106 ng/mL (ref 15–150)
Iron Saturation: 11 % — ABNORMAL LOW (ref 15–55)
Iron: 31 ug/dL (ref 27–159)
Total Iron Binding Capacity: 271 ug/dL (ref 250–450)
UIBC: 240 ug/dL (ref 131–425)

## 2021-08-26 LAB — CBC WITH DIFFERENTIAL/PLATELET
Basophils Absolute: 0.1 10*3/uL (ref 0.0–0.2)
Basos: 1 %
EOS (ABSOLUTE): 0.3 10*3/uL (ref 0.0–0.4)
Eos: 4 %
Hematocrit: 24.2 % — ABNORMAL LOW (ref 34.0–46.6)
Hemoglobin: 7.7 g/dL — ABNORMAL LOW (ref 11.1–15.9)
Immature Grans (Abs): 0 10*3/uL (ref 0.0–0.1)
Immature Granulocytes: 0 %
Lymphocytes Absolute: 1.7 10*3/uL (ref 0.7–3.1)
Lymphs: 18 %
MCH: 26.7 pg (ref 26.6–33.0)
MCHC: 31.8 g/dL (ref 31.5–35.7)
MCV: 84 fL (ref 79–97)
Monocytes Absolute: 0.7 10*3/uL (ref 0.1–0.9)
Monocytes: 7 %
Neutrophils Absolute: 6.9 10*3/uL (ref 1.4–7.0)
Neutrophils: 70 %
Platelets: 247 10*3/uL (ref 150–450)
RBC: 2.88 x10E6/uL — ABNORMAL LOW (ref 3.77–5.28)
RDW: 14.5 % (ref 11.7–15.4)
WBC: 9.7 10*3/uL (ref 3.4–10.8)

## 2021-08-27 ENCOUNTER — Other Ambulatory Visit: Payer: Self-pay | Admitting: Family Medicine

## 2021-08-27 DIAGNOSIS — N184 Chronic kidney disease, stage 4 (severe): Secondary | ICD-10-CM

## 2021-08-27 DIAGNOSIS — D631 Anemia in chronic kidney disease: Secondary | ICD-10-CM

## 2021-08-28 ENCOUNTER — Other Ambulatory Visit: Payer: Self-pay

## 2021-08-28 ENCOUNTER — Other Ambulatory Visit: Payer: HMO

## 2021-08-28 DIAGNOSIS — D631 Anemia in chronic kidney disease: Secondary | ICD-10-CM | POA: Diagnosis not present

## 2021-08-28 DIAGNOSIS — F411 Generalized anxiety disorder: Secondary | ICD-10-CM | POA: Diagnosis not present

## 2021-08-28 DIAGNOSIS — N184 Chronic kidney disease, stage 4 (severe): Secondary | ICD-10-CM | POA: Diagnosis not present

## 2021-08-28 LAB — HEMOGLOBIN AND HEMATOCRIT, BLOOD
Hematocrit: 24.6 % — ABNORMAL LOW (ref 34.0–46.6)
Hemoglobin: 8.2 g/dL — ABNORMAL LOW (ref 11.1–15.9)

## 2021-08-30 ENCOUNTER — Other Ambulatory Visit: Payer: Self-pay | Admitting: Family Medicine

## 2021-08-30 DIAGNOSIS — I5032 Chronic diastolic (congestive) heart failure: Secondary | ICD-10-CM | POA: Diagnosis not present

## 2021-08-30 DIAGNOSIS — D631 Anemia in chronic kidney disease: Secondary | ICD-10-CM | POA: Diagnosis not present

## 2021-08-30 DIAGNOSIS — K5909 Other constipation: Secondary | ICD-10-CM

## 2021-08-30 DIAGNOSIS — Z794 Long term (current) use of insulin: Secondary | ICD-10-CM | POA: Diagnosis not present

## 2021-08-30 DIAGNOSIS — I130 Hypertensive heart and chronic kidney disease with heart failure and stage 1 through stage 4 chronic kidney disease, or unspecified chronic kidney disease: Secondary | ICD-10-CM | POA: Diagnosis not present

## 2021-08-30 DIAGNOSIS — E1022 Type 1 diabetes mellitus with diabetic chronic kidney disease: Secondary | ICD-10-CM | POA: Diagnosis not present

## 2021-08-30 DIAGNOSIS — N179 Acute kidney failure, unspecified: Secondary | ICD-10-CM | POA: Diagnosis not present

## 2021-08-30 DIAGNOSIS — N184 Chronic kidney disease, stage 4 (severe): Secondary | ICD-10-CM | POA: Diagnosis not present

## 2021-08-30 DIAGNOSIS — I340 Nonrheumatic mitral (valve) insufficiency: Secondary | ICD-10-CM | POA: Diagnosis not present

## 2021-08-30 DIAGNOSIS — Z87891 Personal history of nicotine dependence: Secondary | ICD-10-CM | POA: Diagnosis not present

## 2021-08-30 MED ORDER — PANTOPRAZOLE SODIUM 40 MG PO TBEC: 40.0000 mg | DELAYED_RELEASE_TABLET | Freq: Every day | ORAL | 1 refills | Status: DC

## 2021-08-30 MED ORDER — LACTULOSE 10 GM/15ML PO SOLN: 10.0000 g | Freq: Every day | ORAL | 1 refills | Status: DC | PRN

## 2021-08-30 MED ORDER — CLOPIDOGREL BISULFATE 75 MG PO TABS: 75.0000 mg | ORAL_TABLET | Freq: Every day | ORAL | 3 refills | Status: DC

## 2021-08-30 MED ORDER — ALLOPURINOL 100 MG PO TABS: 100.0000 mg | ORAL_TABLET | Freq: Every day | ORAL | 1 refills | Status: DC

## 2021-08-30 MED ORDER — BD PEN NEEDLE NANO 2ND GEN 32G X 4 MM MISC: 1.0000 | Freq: Four times a day (QID) | 3 refills | Status: AC

## 2021-08-30 MED ORDER — LEVEMIR FLEXTOUCH 100 UNIT/ML ~~LOC~~ SOPN: 50.0000 [IU] | PEN_INJECTOR | Freq: Every day | SUBCUTANEOUS | 1 refills | Status: DC

## 2021-08-30 NOTE — Progress Notes (Signed)
Allopurinol, Pen Needles, Clopidogrel, Insulin Aspart, Levemir, Lactulose, and Pantoprazole  [facility] all rxs to [facility] except insulin aspart (novolog), as I am wondering if she needs a pen versus vials. I have will have ccm reach out to ask her.  Dr Tobie Poet

## 2021-09-01 ENCOUNTER — Other Ambulatory Visit (HOSPITAL_COMMUNITY): Payer: Self-pay

## 2021-09-01 ENCOUNTER — Encounter (HOSPITAL_COMMUNITY): Payer: Self-pay | Admitting: Internal Medicine

## 2021-09-01 ENCOUNTER — Other Ambulatory Visit: Payer: Self-pay | Admitting: Family Medicine

## 2021-09-01 DIAGNOSIS — F411 Generalized anxiety disorder: Secondary | ICD-10-CM

## 2021-09-01 MED ORDER — TORSEMIDE 20 MG PO TABS: 60.0000 mg | ORAL_TABLET | Freq: Every day | ORAL | 6 refills | Status: DC

## 2021-09-01 NOTE — Telephone Encounter (Signed)
Refill sent to pharmacy.   

## 2021-09-04 ENCOUNTER — Other Ambulatory Visit: Payer: Self-pay

## 2021-09-04 ENCOUNTER — Other Ambulatory Visit: Payer: Self-pay | Admitting: Family Medicine

## 2021-09-04 DIAGNOSIS — D631 Anemia in chronic kidney disease: Secondary | ICD-10-CM

## 2021-09-04 MED ORDER — NOVOLOG FLEXPEN 100 UNIT/ML ~~LOC~~ SOPN: PEN_INJECTOR | SUBCUTANEOUS | 3 refills | Status: DC

## 2021-09-05 ENCOUNTER — Other Ambulatory Visit: Payer: Self-pay | Admitting: Family Medicine

## 2021-09-11 DIAGNOSIS — F411 Generalized anxiety disorder: Secondary | ICD-10-CM | POA: Diagnosis not present

## 2021-09-12 ENCOUNTER — Other Ambulatory Visit: Payer: Self-pay

## 2021-09-13 ENCOUNTER — Encounter: Payer: Self-pay | Admitting: Hematology and Oncology

## 2021-09-13 ENCOUNTER — Other Ambulatory Visit: Payer: Self-pay | Admitting: Hematology and Oncology

## 2021-09-13 ENCOUNTER — Inpatient Hospital Stay: Payer: HMO | Attending: Hematology and Oncology | Admitting: Hematology and Oncology

## 2021-09-13 ENCOUNTER — Other Ambulatory Visit: Payer: Self-pay

## 2021-09-13 ENCOUNTER — Inpatient Hospital Stay: Payer: HMO

## 2021-09-13 VITALS — BP 152/67 | HR 76 | Temp 97.7°F | Resp 20 | Ht 64.5 in | Wt 232.2 lb

## 2021-09-13 DIAGNOSIS — Z79899 Other long term (current) drug therapy: Secondary | ICD-10-CM | POA: Diagnosis not present

## 2021-09-13 DIAGNOSIS — Z87891 Personal history of nicotine dependence: Secondary | ICD-10-CM | POA: Insufficient documentation

## 2021-09-13 DIAGNOSIS — D649 Anemia, unspecified: Secondary | ICD-10-CM

## 2021-09-13 DIAGNOSIS — Z803 Family history of malignant neoplasm of breast: Secondary | ICD-10-CM | POA: Diagnosis not present

## 2021-09-13 DIAGNOSIS — N184 Chronic kidney disease, stage 4 (severe): Secondary | ICD-10-CM | POA: Diagnosis not present

## 2021-09-13 DIAGNOSIS — D631 Anemia in chronic kidney disease: Secondary | ICD-10-CM | POA: Diagnosis not present

## 2021-09-13 DIAGNOSIS — Z801 Family history of malignant neoplasm of trachea, bronchus and lung: Secondary | ICD-10-CM | POA: Insufficient documentation

## 2021-09-13 LAB — HEPATIC FUNCTION PANEL
ALT: 21 (ref 7–35)
AST: 29 (ref 13–35)
Alkaline Phosphatase: 131 — AB (ref 25–125)
Bilirubin, Total: 0.4

## 2021-09-13 LAB — BASIC METABOLIC PANEL
BUN: 71 — AB (ref 4–21)
CO2: 27 — AB (ref 13–22)
Chloride: 103 (ref 99–108)
Creatinine: 2.7 — AB (ref 0.5–1.1)
Glucose: 191
Potassium: 4.2 (ref 3.4–5.3)
Sodium: 140 (ref 137–147)

## 2021-09-13 LAB — LACTATE DEHYDROGENASE: LDH: 176 U/L (ref 98–192)

## 2021-09-13 LAB — VITAMIN B12: Vitamin B-12: 661 pg/mL (ref 180–914)

## 2021-09-13 LAB — CBC AND DIFFERENTIAL
HCT: 25 — AB (ref 36–46)
Hemoglobin: 8.4 — AB (ref 12.0–16.0)
Neutrophils Absolute: 7.31
Platelets: 249 (ref 150–399)
WBC: 10.3

## 2021-09-13 LAB — RETICULOCYTES
Immature Retic Fract: 11.3 % (ref 2.3–15.9)
RBC.: 3.03 MIL/uL — ABNORMAL LOW (ref 3.87–5.11)
Retic Count, Absolute: 34.5 10*3/uL (ref 19.0–186.0)
Retic Ct Pct: 1.1 % (ref 0.4–3.1)

## 2021-09-13 LAB — COMPREHENSIVE METABOLIC PANEL
Albumin: 3.9 (ref 3.5–5.0)
Calcium: 9.1 (ref 8.7–10.7)

## 2021-09-13 LAB — CBC: RBC: 2.97 — AB (ref 3.87–5.11)

## 2021-09-13 LAB — IRON AND TIBC
Iron: 39 ug/dL (ref 28–170)
Saturation Ratios: 12 % (ref 10.4–31.8)
TIBC: 328 ug/dL (ref 250–450)
UIBC: 289 ug/dL

## 2021-09-13 LAB — FOLATE: Folate: 13.6 ng/mL (ref >5.9)

## 2021-09-13 LAB — FERRITIN: Ferritin: 64 ng/mL (ref 11–307)

## 2021-09-13 NOTE — Progress Notes (Signed)
[facility]  [address] [place],  [place]  [address] [phone]  Clinic Day:  09/13/2021  Referring physician: [name], MD   REASON FOR CONSULTATION:  Iron deficiency  HISTORY OF PRESENT ILLNESS:  Dana [name] is a 61 y.o. female with a complicated medical history, including stage IV chronic kidney disease, who is referred in consultation by Dr. Biagio Quint, nephrology, for assessment and management of iron deficiency.  She had worsening anemia, so was given a dose of Retacrit in December.  She then was then found to have a low iron saturation of 11% with a TIBC of 271 and ferritin 106 on February 10th, and gives a history of allergic reaction to IV iron, so was referred here.  She states she broke out in severe hives after receiving IV iron in the past 1 to 2 years.  She states she also has had blood transfusion on several occasions, most recent being in December 2022.  She has a history of hypoxic brain injury in June 2022, so has difficulty with dates.    She reports severe fatigue as well as dyspnea with limited exertion.  She denies pica to ice.  She gives a history of rectal bleeding in the remote past, but denies current rectal bleeding.  She denies melena.  She denies other overt form of blood loss.  She is on clopidogrel.  Review of her records reveal she received 1 unit of packed cells in November 2022 when her hemoglobin was 7.2.  She was given IV Feraheme in February 2022 when admitted for acute on chronic diastolic heart failure.  She reported severe generalized itching after receiving the first dose of Feraheme.  No hives were noted by the examining provider.  The 2nd dose of Feraheme was not given.  She was transfused in February 2022 when her hemoglobin was 7.8 and again in March 2022 when her hemoglobin was 7.  In 2017, when she reported rectal bleeding, as well as black tarry stools, EGD and colonoscopy were recommended.  EGD in  September 2017 revealed a pyloric erosion.  I was unable to access the colonoscopy report from September 2017. She had EGD in September 2014 that was normal.  She was diagnosed with gastroparesis in September 2014.  It was noted in her chart that she had a colonoscopy in April 2014 which was normal, but I could not find a report.  She was also transfused in April 2013 at [facility] when her hemoglobin was 7.4.  She has had chronic anemia since 2010.   REVIEW OF SYSTEMS:  Review of Systems  Constitutional:  Positive for appetite change (Decreased) and fatigue. Negative for chills, fever and unexpected weight change.  HENT:   Negative for lump/mass, mouth sores and sore throat.   Respiratory:  Positive for shortness of breath (With exertion). Negative for cough.   Cardiovascular:  Negative for chest pain and leg swelling.  Gastrointestinal:  Negative for abdominal pain, constipation, diarrhea, nausea and vomiting.  Genitourinary:  Negative for difficulty urinating, dysuria, frequency and hematuria.   Musculoskeletal:  Positive for arthralgias (Due to rheumatoid arthritis). Negative for back pain and myalgias.  Skin:  Negative for rash.  Neurological:  Negative for dizziness and headaches.  Hematological:  Negative for adenopathy. Does not bruise/bleed easily.  Psychiatric/Behavioral:  Positive for depression and sleep disturbance. The patient is nervous/anxious.     VITALS:  Blood pressure (!) 152/67, pulse 76, temperature 97.7 F (36.5 C), temperature  source Oral, resp. rate 20, height 5' 4.5" (1.638 m), weight 232 lb 3.5 oz (105.3 kg), SpO2 96 %.  Wt Readings from Last 3 Encounters:  09/13/21 232 lb 3.5 oz (105.3 kg)  07/26/21 234 lb (106.1 kg)  07/12/21 227 lb 12.8 oz (103.3 kg)    Body mass index is 39.24 kg/m.  Performance status (ECOG): 2 - Symptomatic, <50% confined to bed  PHYSICAL EXAM:  Physical Exam Vitals and nursing note reviewed.  Constitutional:      General: She is  not in acute distress.    Appearance: Normal appearance.  HENT:     Head: Normocephalic and atraumatic.     Mouth/Throat:     Mouth: Mucous membranes are moist.     Pharynx: Oropharynx is clear. No oropharyngeal exudate or posterior oropharyngeal erythema.  Eyes:     General: No scleral icterus.    Extraocular Movements: Extraocular movements intact.     Conjunctiva/sclera: Conjunctivae normal.     Pupils: Pupils are equal, round, and reactive to light.  Cardiovascular:     Rate and Rhythm: Normal rate and regular rhythm.     Heart sounds: Normal heart sounds. No murmur heard.   No friction rub. No gallop.  Pulmonary:     Effort: Pulmonary effort is normal.     Breath sounds: Normal breath sounds. No wheezing, rhonchi or rales.  Abdominal:     General: There is no distension.     Palpations: Abdomen is soft. There is no hepatomegaly, splenomegaly or mass.     Tenderness: There is no abdominal tenderness.  Musculoskeletal:        General: Normal range of motion.     Cervical back: Normal range of motion and neck supple. No tenderness.     Right lower leg: No edema.     Left lower leg: No edema.  Lymphadenopathy:     Cervical: No cervical adenopathy.     Upper Body:     Right upper body: No supraclavicular or axillary adenopathy.     Left upper body: No supraclavicular or axillary adenopathy.     Lower Body: No right inguinal adenopathy. No left inguinal adenopathy.  Skin:    General: Skin is warm and dry.     Coloration: Skin is not jaundiced.     Findings: No rash.  Neurological:     Mental Status: She is alert and oriented to person, place, and time.     Cranial Nerves: No cranial nerve deficit.  Psychiatric:        Mood and Affect: Mood normal.        Behavior: Behavior normal.        Thought Content: Thought content normal.     LABS:   CBC Latest Ref Rng & Units 09/13/2021 08/28/2021 08/25/2021  WBC - 10.3 - 9.7  Hemoglobin 12.0 - 16.0 8.4(A) 8.2(L) 7.7(L)   Hematocrit 36 - 46 25(A) 24.6(L) 24.2(L)  Platelets 150 - 399 249 - 247   CMP Latest Ref Rng & Units 09/13/2021 08/25/2021 07/26/2021  Glucose 70 - 99 mg/dL - 155(H) 75  BUN 4 - 21 71(A) 64(H) 50(H)  Creatinine 0.5 - 1.1 2.7(A) 2.61(H) 2.29(H)  Sodium 137 - 147 140 139 139  Potassium 3.4 - 5.3 4.2 5.0 4.5  Chloride 99 - 108 103 98 102  CO2 13 - 22 27(A) 25 28  Calcium 8.7 - 10.7 9.1 8.9 9.0  Total Protein 6.5 - 8.1 g/dL - - 7.6  Total Bilirubin  0.3 - 1.2 mg/dL - - 0.5  Alkaline Phos 25 - 125 131(A) - 125  AST 13 - 35 29 - 18  ALT 7 - 35 21 - 15     No results found for: CEA1 / No results found for: CEA1 No results found for: PSA1 No results found for: JJO841 No results found for: CAN125  No results found for: Ronnald Ramp, A1GS, A2GS, Arnaldo Natal, GAMS, MSPIKE, SPEI Lab Results  Component Value Date   TIBC 271 08/25/2021   TIBC 340 04/26/2021   TIBC 224 (L) 10/03/2020   FERRITIN 106 08/25/2021   FERRITIN 76 04/26/2021   FERRITIN 287 10/03/2020   IRONPCTSAT 11 (L) 08/25/2021   IRONPCTSAT 13 04/26/2021   IRONPCTSAT 15 10/03/2020   No results found for: LDH  STUDIES:  No results found.    HISTORY:   Past Medical History:  Diagnosis Date   Acquired absence of left great toe ([place])    Acquired absence of other left toe(s) ([place])    ACS (acute coronary syndrome) ([place])    Anemia    Anoxic brain injury ([place])    Anxiety    CAD (coronary artery disease)    DES mLAD 04/07/15; DES dRCA & DES pPDA 08/30/16; DES mCX & PTCA OM1 09/29/20   Chronic diastolic (congestive) heart failure (HCC)    Chronic pain    Chronic systolic (congestive) heart failure (HCC)    CKD (chronic kidney disease)    Contrast dye induced nephropathy, possible 09/03/2020   COPD (chronic obstructive pulmonary disease) (HCC)    Diabetes (HCC)    Diastolic CHF (HCC)    Dyspnea    Gastro-esophageal reflux disease without esophagitis    Hyperlipidemia    Hypertension    Hypertensive heart  disease with heart failure (HCC)    Hypoxemia    Idiopathic gout, multiple sites    Localized edema    Low back pain    Mitral regurgitation    Mitral regurgitation    Mixed hyperlipidemia    Mixed incontinence    Morbid (severe) obesity due to excess calories (HCC)    Neuromuscular disorder (HCC)    Neuropathy    Non-ST elevation (NSTEMI) myocardial infarction ([place])    08/29/20, 09/29/20   OSA (obstructive sleep apnea) 09/03/2020   Other specified hypothyroidism    PAF (paroxysmal atrial fibrillation) (HCC)    s/p pulmonary vein isolation by cryoablation 10/04/15 Dr. Minna Merritts in [facility]   PEA (Pulseless electrical activity) [facility])    ~ 01/13/21 at [facility] during admission DKA, volume overload, possible CAP, hypoxia s/p ACLS with ROSC ~ 10-15   Pneumonia    Primary insomnia    Rheumatoid arthritis ([place])    Stroke ([place])    Thyroid disease    Type 2 diabetes mellitus with diabetic nephropathy ([place])    Vitamin D deficiency     Past Surgical History:  Procedure Laterality Date   ABDOMINAL HYSTERECTOMY     Partial- Still has both ovaries   CARDIOVASCULAR STRESS TEST  08/13/2014   Nuclear; Normal   CARPAL TUNNEL RELEASE     CATARACT EXTRACTION, BILATERAL     CESAREAN SECTION     CHOLECYSTECTOMY     CORONARY ANGIOPLASTY WITH STENT PLACEMENT     3 blockages/ 1 stent   CORONARY STENT INTERVENTION N/A 09/29/2020   Procedure: CORONARY STENT INTERVENTION;  Surgeon: Sherren Mocha, MD;  Location: [facility];  Service: Cardiovascular;  Laterality: N/A;  CFX  EYE SURGERY     LEFT HEART CATH AND CORONARY ANGIOGRAPHY N/A 09/29/2020   Procedure: LEFT HEART CATH AND CORONARY ANGIOGRAPHY;  Surgeon: Sherren Mocha, MD;  Location: [facility];  Service: Cardiovascular;  Laterality: N/A;   MITRAL VALVE REPAIR N/A 05/18/2021   Procedure: MITRAL VALVE REPAIR;  Surgeon: Early Osmond, MD;  Location: [facility];  Service: Cardiovascular;  Laterality: N/A;   RIGHT  HEART CATH N/A 04/27/2021   Procedure: RIGHT HEART CATH;  Surgeon: Jolaine Artist, MD;  Location: [facility];  Service: Cardiovascular;  Laterality: N/A;   RIGHT/LEFT HEART CATH AND CORONARY ANGIOGRAPHY N/A 01/07/2017   Procedure: Right/Left Heart Cath and Coronary Angiography;  Surgeon: Larey Dresser, MD;  Location: [facility];  Service: Cardiovascular;  Laterality: N/A;   ROTATOR CUFF REPAIR     TEE WITHOUT CARDIOVERSION N/A 04/28/2021   Procedure: TRANSESOPHAGEAL ECHOCARDIOGRAM (TEE);  Surgeon: Jolaine Artist, MD;  Location: [facility] ENDOSCOPY;  Service: Cardiovascular;  Laterality: N/A;   TEE WITHOUT CARDIOVERSION N/A 05/18/2021   Procedure: TRANSESOPHAGEAL ECHOCARDIOGRAM (TEE);  Surgeon: Early Osmond, MD;  Location: [facility];  Service: Cardiovascular;  Laterality: N/A;   TOE AMPUTATION Left    US ECHOCARDIOGRAPHY  05/2017   Normal    Family History  Problem Relation Age of Onset   Coronary artery disease Mother    Hypertension Mother    Hyperlipidemia Mother    Diabetes type II Mother    Breast cancer Mother    Lung cancer Mother    Diabetes Father    Hypertension Father    Bipolar disorder Other    Depression Other    Schizophrenia Other    Mental illness Sister     Social History:  reports that she quit smoking about 39 years ago. Her smoking use included cigarettes. She started smoking about 40 years ago. She has never used smokeless tobacco. She reports that she does not drink alcohol and does not use drugs.The patient is alone today.  Allergies:  Allergies  Allergen Reactions   Naproxen Hives and Rash   Celecoxib Other (See Comments)    Stomach pain   Aspirin Other (See Comments)    CAN NOT TAKE DUE TO ULCERS (documented previously) but takes every day currently (as of 09/29/20) without reported issues Can take coated aspirin   Glucosamine Hives, Swelling and Other (See Comments)    Angioedema    Iron     IV iron transfusion -  hives - Feb 2022 hospitalization   Metoclopramide Other (See Comments)    Facial twitching and stuttering   Metolazone Other (See Comments)    Acute renal failure   Shellfish-Derived Products Hives, Swelling and Other (See Comments)    Angioedema    Statins Other (See Comments)    Muscle pain   Tramadol Nausea And Vomiting         Current Medications: Current Outpatient Medications  Medication Sig Dispense Refill   allopurinol (ZYLOPRIM) 100 MG tablet Take 1 tablet by mouth daily.     amoxicillin (AMOXIL) 500 MG capsule TAKE FOUR CAPSULES BY MOUTH ONE HOUR BEFORE DENTAL PROCEDURES AND CLEANINGS (Patient not taking: Reported on 07/27/2021)     Aspirin (VAZALORE) 81 MG CAPS Take by mouth.     busPIRone (BUSPAR) 5 MG tablet TAKE ONE TABLET BY MOUTH THREE TIMES DAILY 180 tablet 0   carvedilol (COREG) 12.5 MG tablet Take 1 tablet (12.5 mg total) by mouth  2 (two) times daily. 60 tablet 11   Cholecalciferol (VITAMIN D) 50 MCG (2000 UT) tablet Take 2,000 Units by mouth daily. Take one daily     clopidogrel (PLAVIX) 75 MG tablet Take 1 tablet by mouth daily.     Cyanocobalamin 3000 MCG CAPS Take 2 capsules by mouth daily. Patient purchased 3000 mcg gummies and taking 2 gummies daily     EPINEPHrine 0.3 mg/0.3 mL IJ SOAJ injection Use as directed for life-threatening allergic reaction. 4 Device 3   Evolocumab (REPATHA SURECLICK) 161 MG/ML SOAJ Inject 1 Dose into the skin every 14 (fourteen) days. 6 mL 31   ezetimibe (ZETIA) 10 MG tablet Take 1 tablet by mouth daily.     ferrous sulfate 325 (65 FE) MG tablet Take 1 tablet by mouth daily.     hydrALAZINE (APRESOLINE) 25 MG tablet Take by mouth.     HYDROcodone-acetaminophen (NORCO) 7.5-325 MG tablet TAKE ONE TABLET BY MOUTH three times times daily AS NEEDED FOR moderate pain 90 tablet 0   insulin aspart (NOVOLOG FLEXPEN) 100 UNIT/ML FlexPen 2-10 U before meals based on SSI. 15 mL 3   Insulin Pen Needle (BD PEN NEEDLE NANO 2ND GEN) 32G X 4 MM MISC  Inject 1 each into the skin in the morning, at noon, in the evening, and at bedtime. 600 each 3   isosorbide mononitrate (IMDUR) 30 MG 24 hr tablet Take 1 tablet by mouth daily.     lactulose (CHRONULAC) 10 GM/15ML solution Take 15 mLs (10 g total) by mouth daily as needed for mild constipation. 236 mL 1   latanoprost (XALATAN) 0.005 % ophthalmic solution Place 1 drop into both eyes at bedtime.     LEVEMIR FLEXTOUCH 100 UNIT/ML FlexTouch Pen Inject 50 Units into the skin daily. 15 mL 1   LORazepam (ATIVAN) 0.5 MG tablet Take 1 tablet (0.5 mg total) by mouth daily as needed for anxiety. 30 tablet 2   magnesium oxide (MAG-OX) 400 MG tablet Take 2 tablets (800 mg total) by mouth daily. Take 2 (400 mg) daily=800 mg 60 tablet 1   metolazone (ZAROXOLYN) 2.5 MG tablet Take 1 tablet (2.5 mg total) by mouth daily. 2 tablet 0   nitroGLYCERIN (NITROSTAT) 0.4 MG SL tablet Place 1 tablet (0.4 mg total) under the tongue every 5 (five) minutes as needed for chest pain. 25 tablet 3   Omega-3 Fatty Acids (FISH OIL PO) Take 1 capsule by mouth daily.     OXYGEN Inhale 2 L into the lungs at bedtime.     pantoprazole (PROTONIX) 40 MG tablet Take 1 tablet (40 mg total) by mouth daily. 90 tablet 1   potassium chloride SA (KLOR-CON M) 20 MEQ tablet Take 1 tablet by mouth daily.     Probiotic CAPS Take 1 capsule by mouth daily.     promethazine (PHENERGAN) 25 MG tablet Take by mouth.     torsemide (DEMADEX) 20 MG tablet Take 3 tablets (60 mg total) by mouth daily. 90 tablet 6   No current facility-administered medications for this visit.     ASSESSMENT & PLAN:   Assessment:  LAURIS KEEPERS is a 61 y.o. female with chronic anemia that has been worsening.  This is most likely anemia of chronic kidney disease/anemia of chronic disease.  Repeat iron studies today do not reveal iron deficiency, soluble transferrin is pending to confirm.  LDH and bilirubin are normal, so hemolysis is unlikely the cause of her anemia,  haptoglobin is pending to confirm.  Reticulocyte count is normal.  Plan: Additional evaluation of anemia with soluble transferrin, haptoglobin and serum protein electrophoresis is pending.  Stool Hemoccults will also be obtained.  If she is found to have definite iron deficiency, I will have her see her gastroenterologist for reevaluation.  If no specific etiology is found, we will likely recommend continued erythropoietin stimulating agents.  We will plan to see her back in 2 weeks to review the results.  I discussed the assessment and treatment plan with the patient.  The patient was provided an opportunity to ask questions and all were answered.  The patient agreed with the plan and demonstrated an understanding of the instructions.  The patient was advised to call back if the symptoms worsen or if the condition fails to improve as anticipated.  Thank you for the opportunity to care for this lovely woman.   I provided 45 minutes of face-to-face time during this encounter and > 50% was spent counseling as documented under my assessment and plan.    Marvia Pickles, PA-C

## 2021-09-14 DIAGNOSIS — G4733 Obstructive sleep apnea (adult) (pediatric): Secondary | ICD-10-CM | POA: Diagnosis not present

## 2021-09-14 LAB — HAPTOGLOBIN: Haptoglobin: 165 mg/dL (ref 33–346)

## 2021-09-14 LAB — SOLUBLE TRANSFERRIN RECEPTOR: Transferrin Receptor: 16.3 nmol/L (ref 12.2–27.3)

## 2021-09-18 ENCOUNTER — Ambulatory Visit (INDEPENDENT_AMBULATORY_CARE_PROVIDER_SITE_OTHER): Payer: HMO | Admitting: Internal Medicine

## 2021-09-18 ENCOUNTER — Encounter: Payer: Self-pay | Admitting: Internal Medicine

## 2021-09-18 ENCOUNTER — Ambulatory Visit (HOSPITAL_COMMUNITY): Payer: HMO | Attending: Internal Medicine

## 2021-09-18 ENCOUNTER — Other Ambulatory Visit: Payer: Self-pay

## 2021-09-18 VITALS — BP 122/60 | HR 69 | Ht 64.5 in | Wt 232.0 lb

## 2021-09-18 DIAGNOSIS — Z9889 Other specified postprocedural states: Secondary | ICD-10-CM | POA: Insufficient documentation

## 2021-09-18 DIAGNOSIS — N1832 Chronic kidney disease, stage 3b: Secondary | ICD-10-CM

## 2021-09-18 DIAGNOSIS — I2510 Atherosclerotic heart disease of native coronary artery without angina pectoris: Secondary | ICD-10-CM | POA: Diagnosis not present

## 2021-09-18 DIAGNOSIS — E1022 Type 1 diabetes mellitus with diabetic chronic kidney disease: Secondary | ICD-10-CM

## 2021-09-18 DIAGNOSIS — N184 Chronic kidney disease, stage 4 (severe): Secondary | ICD-10-CM | POA: Diagnosis not present

## 2021-09-18 DIAGNOSIS — I340 Nonrheumatic mitral (valve) insufficiency: Secondary | ICD-10-CM | POA: Diagnosis not present

## 2021-09-18 DIAGNOSIS — D631 Anemia in chronic kidney disease: Secondary | ICD-10-CM | POA: Diagnosis not present

## 2021-09-18 DIAGNOSIS — I5032 Chronic diastolic (congestive) heart failure: Secondary | ICD-10-CM

## 2021-09-18 DIAGNOSIS — Z95818 Presence of other cardiac implants and grafts: Secondary | ICD-10-CM

## 2021-09-18 DIAGNOSIS — I739 Peripheral vascular disease, unspecified: Secondary | ICD-10-CM

## 2021-09-18 LAB — PROTEIN ELECTROPHORESIS, SERUM, WITH REFLEX
A/G Ratio: 0.9 (ref 0.7–1.7)
Albumin ELP: 3.5 g/dL (ref 2.9–4.4)
Alpha-1-Globulin: 0.3 g/dL (ref 0.0–0.4)
Alpha-2-Globulin: 0.8 g/dL (ref 0.4–1.0)
Beta Globulin: 1 g/dL (ref 0.7–1.3)
Gamma Globulin: 1.8 g/dL (ref 0.4–1.8)
Globulin, Total: 3.8 g/dL (ref 2.2–3.9)
Total Protein ELP: 7.3 g/dL (ref 6.0–8.5)

## 2021-09-18 LAB — ECHOCARDIOGRAM COMPLETE
Area-P 1/2: 2.39 cm2
MV M vel: 4.04 m/s
MV Peak grad: 65.3 mmHg
MV VTI: 1.05 cm2
S' Lateral: 3.5 cm

## 2021-09-18 MED ORDER — PERFLUTREN LIPID MICROSPHERE
1.0000 mL | INTRAVENOUS | Status: AC | PRN
  Administered 2021-09-18: 2 mL via INTRAVENOUS
  Administered 2021-09-18: 3 mL via INTRAVENOUS
  Administered 2021-09-18: 2 mL via INTRAVENOUS

## 2021-09-18 NOTE — Patient Instructions (Addendum)
Medication Instructions:  ?No changes - please plan to stop aspirin on May 1.  Continue Plavix. ? ?*If you need a refill on your cardiac medications before your next appointment, please call your pharmacy* ? ? ?Lab Work: ?none ? ? ?Testing/Procedures: ?none ? ? ?Follow-Up: ?9 month follow up with echo and appointment with Kathyrn Drown, NP ? ?Other Instructions ?Pt aware and in agreement that we will schedule her echo and follow up and she will receive the appointment notification in [place].  ?  ?

## 2021-09-18 NOTE — Progress Notes (Signed)
Cardiology Office Note:    Date:  09/18/2021   ID:  Dana [name], DOB 1960/11/26, MRN [id number]  PCP:  [name], MD   [facility] Providers Cardiologist:  Lenna Sciara, MD Referring MD: [name], MD   Chief Complaint/Reason for Referral:  Follow up Mitraclip  ASSESSMENT:    S/P mitral valve clip implantation  Chronic diastolic heart failure ([place])  Coronary artery disease involving native coronary artery of native heart without angina pectoris  Type 1 diabetes mellitus with stage 4 chronic kidney disease ([place])  Stage 3b chronic kidney disease ([place])  PVD (peripheral vascular disease) ([place])  Anemia due to stage 4 chronic kidney disease ([place])  PLAN:    In order of problems listed above:  1.  Continue dual antiplatelet therapy for 3 more months for a total of 6 months and then change to Plavix monotherapy given history of coronary artery disease with PCI.  I reviewed the echocardiogram done today which demonstrates moderate mitral regurgitation with a mean gradient of 7 to 8 mmHg.  We will await the final read.   Patient will be seen by the structural heart division in 9 months for required 1 year follow-up. 2.  Patient's weight is relatively stable and she seems euvolemic on exam today. 3.  Continue aspirin until May and then continue Plavix only. 4.  Not a candidate for SGLT2 inhibitor. 5.  This is being followed by her nephrologist there is a great concern that her kidney function is declining and this is leading to more severe anemia.  There are discussions about initiating more regular erythropoietin supplementation. 6.  Continue aspirin and statin. 7.  This is being evaluated by hematology she may be undergoing more frequent erythropoietin administrations.          Dispo:  Return in about 9 months (around 06/20/2022).     Medication Adjustments/Labs and Tests Ordered: Current medicines are reviewed at length with the patient today.  Concerns regarding  medicines are outlined above.   Tests Ordered: No orders of the defined types were placed in this encounter.   Medication Changes: No orders of the defined types were placed in this encounter.   History of Present Illness:    FOCUSED PROBLEM LIST:   1.  Severe functional mitral regurgitation s/p Mitraclip 11/22 with residual moderate MR 2.  [id number] 3.  Diastolic heart failure 4.  Paroxysmal atrial fibrillation status post ablation 5.  Peripheral arterial disease status post toe amputation 6.  Chronic kidney disease with creatinine around 2.3; stage IV 7.  Coronary artery disease status post PCI of left circumflex with moderate LAD and RCA disease in 2022   The patient is a 61 y.o. female with the indicated medical history here for follow up.  She underwent MitraClip therapy in November of last year.  She had 2 clips placed but the second clip became entangled and had to be deployed in a less than optimal position.  She was left with residual moderate mitral regurgitation.  Fortunately she has done quite well with this degree of mitral regurgitation.  She was seen by the heart failure division in early January and was doing well and participating in cardiac rehabilitation in [place].  At this point in time her hemoglobin was around 8.2.  Since that time her hemoglobin has decreased.  She has been feeling more short of breath and fatigue.  She denies any increasing peripheral edema, orthopnea, or increase in her weight.  She was evaluated by nephrology  and hematology and evaluation is ongoing regarding her anemia which looks like may be consistent with anemia of chronic disease however her ferritin is low.  She fortunately has not required any emergency room visits or hospitalizations recently  Previous Medical History: Past Medical History:  Diagnosis Date   Acquired absence of left great toe ([place])    Acquired absence of other left toe(s) ([place])    ACS (acute coronary syndrome) ([place])     Anemia    Anoxic brain injury ([place])    Anxiety    CAD (coronary artery disease)    DES mLAD 04/07/15; DES dRCA & DES pPDA 08/30/16; DES mCX & PTCA OM1 09/29/20   Chronic diastolic (congestive) heart failure (HCC)    Chronic pain    Chronic systolic (congestive) heart failure (HCC)    CKD (chronic kidney disease)    Contrast dye induced nephropathy, possible 09/03/2020   COPD (chronic obstructive pulmonary disease) (HCC)    Diabetes (HCC)    Diastolic CHF (HCC)    Dyspnea    Gastro-esophageal reflux disease without esophagitis    Hyperlipidemia    Hypertension    Hypertensive heart disease with heart failure (HCC)    Hypoxemia    Idiopathic gout, multiple sites    Localized edema    Low back pain    Mitral regurgitation    Mitral regurgitation    Mixed hyperlipidemia    Mixed incontinence    Morbid (severe) obesity due to excess calories (HCC)    Neuromuscular disorder (HCC)    Neuropathy    Non-ST elevation (NSTEMI) myocardial infarction ([place])    08/29/20, 09/29/20   OSA (obstructive sleep apnea) 09/03/2020   Other specified hypothyroidism    PAF (paroxysmal atrial fibrillation) (HCC)    s/p pulmonary vein isolation by cryoablation 10/04/15 Dr. Minna Merritts in [facility]   PEA (Pulseless electrical activity) [facility])    ~ 01/13/21 at [facility] during admission DKA, volume overload, possible CAP, hypoxia s/p ACLS with ROSC ~ 10-15   Pneumonia    Primary insomnia    Rheumatoid arthritis ([place])    Stroke ([place])    Thyroid disease    Type 2 diabetes mellitus with diabetic nephropathy (HCC)    Vitamin D deficiency      Current Medications: Current Meds  Medication Sig   allopurinol (ZYLOPRIM) 100 MG tablet Take 1 tablet by mouth daily.   amoxicillin (AMOXIL) 500 MG capsule TAKE FOUR CAPSULES BY MOUTH ONE HOUR BEFORE DENTAL PROCEDURES AND CLEANINGS   Aspirin (VAZALORE) 81 MG CAPS Take 1 tablet by mouth daily.   busPIRone (BUSPAR) 5 MG tablet TAKE ONE TABLET BY MOUTH THREE TIMES  DAILY   carvedilol (COREG) 12.5 MG tablet Take 1 tablet (12.5 mg total) by mouth 2 (two) times daily.   Cholecalciferol (VITAMIN D) 50 MCG (2000 UT) tablet Take 2,000 Units by mouth daily. Take one daily   clopidogrel (PLAVIX) 75 MG tablet Take 1 tablet by mouth daily.   Cyanocobalamin 3000 MCG CAPS Take 2 capsules by mouth daily. Patient purchased 3000 mcg gummies and taking 2 gummies daily   EPINEPHrine 0.3 mg/0.3 mL IJ SOAJ injection Use as directed for life-threatening allergic reaction.   Evolocumab (REPATHA SURECLICK) 161 MG/ML SOAJ Inject 1 Dose into the skin every 14 (fourteen) days.   ezetimibe (ZETIA) 10 MG tablet Take 1 tablet by mouth daily.   ferrous sulfate 325 (65 FE) MG tablet Take 1 tablet by mouth daily.   hydrALAZINE (APRESOLINE) 25 MG tablet  Take by mouth.   HYDROcodone-acetaminophen (NORCO) 7.5-325 MG tablet TAKE ONE TABLET BY MOUTH three times times daily AS NEEDED FOR moderate pain   insulin aspart (NOVOLOG FLEXPEN) 100 UNIT/ML FlexPen 2-10 U before meals based on SSI.   Insulin Pen Needle (BD PEN NEEDLE NANO 2ND GEN) 32G X 4 MM MISC Inject 1 each into the skin in the morning, at noon, in the evening, and at bedtime.   isosorbide mononitrate (IMDUR) 30 MG 24 hr tablet Take 1 tablet by mouth daily.   lactulose (CHRONULAC) 10 GM/15ML solution Take 15 mLs (10 g total) by mouth daily as needed for mild constipation.   latanoprost (XALATAN) 0.005 % ophthalmic solution Place 1 drop into both eyes at bedtime.   LEVEMIR FLEXTOUCH 100 UNIT/ML FlexTouch Pen Inject 50 Units into the skin daily.   LORazepam (ATIVAN) 0.5 MG tablet Take 1 tablet (0.5 mg total) by mouth daily as needed for anxiety.   magnesium oxide (MAG-OX) 400 MG tablet Take 2 tablets (800 mg total) by mouth daily. Take 2 (400 mg) daily=800 mg   metolazone (ZAROXOLYN) 2.5 MG tablet Take 1 tablet (2.5 mg total) by mouth daily.   nitroGLYCERIN (NITROSTAT) 0.4 MG SL tablet Place 1 tablet (0.4 mg total) under the tongue  every 5 (five) minutes as needed for chest pain.   Omega-3 Fatty Acids (FISH OIL PO) Take 1 capsule by mouth daily.   OXYGEN Inhale 2 L into the lungs at bedtime.   pantoprazole (PROTONIX) 40 MG tablet Take 1 tablet (40 mg total) by mouth daily.   potassium chloride SA (KLOR-CON M) 20 MEQ tablet Take 1 tablet by mouth daily.   Probiotic CAPS Take 1 capsule by mouth daily.   promethazine (PHENERGAN) 25 MG tablet Take by mouth.   torsemide (DEMADEX) 20 MG tablet Take 3 tablets (60 mg total) by mouth daily.     Allergies:    Naproxen, Celecoxib, Aspirin, Glucosamine, Iron, Metoclopramide, Metolazone, Shellfish-derived products, Statins, and Tramadol   Social History:   Social History   Tobacco Use   Smoking status: Former    Types: Cigarettes    Start date: 1983    Quit date: 1984    Years since quitting: 39.2   Smokeless tobacco: Never  Vaping Use   Vaping Use: Never used  Substance Use Topics   Alcohol use: Never   Drug use: Never     Family Hx: Family History  Problem Relation Age of Onset   Coronary artery disease Mother    Hypertension Mother    Hyperlipidemia Mother    Diabetes type II Mother    Breast cancer Mother    Lung cancer Mother    Diabetes Father    Hypertension Father    Bipolar disorder Other    Depression Other    Schizophrenia Other    Mental illness Sister      Review of Systems:   Please see the history of present illness.    All other systems reviewed and are negative.     EKGs/Labs/Other Test Reviewed:    EKG:  SR  Prior CV studies: TTE 2/23 Pending  TTE 12/22  1. Left ventricular ejection fraction, by estimation, is 60 to 65%. The  left ventricle has normal function. The left ventricle has no regional  wall motion abnormalities. Left ventricular diastolic parameters are  indeterminate.   2. Right ventricular systolic function is mildly reduced. The right  ventricular size is mildly enlarged. There is mildly elevated pulmonary  artery systolic pressure.   3. Left atrial size was moderately dilated.   4. Patient is s/p mitral clip with XTW between A2/P2. Had second clip NTW  lateral to this. Apparently this clip was only attached to the anterior  leaflet. Desptie this the clips seem in stable position with moderate  residual MR between the clips and  dual mitral inflows on either side suggesting at least one of the clips  has a good tissue bridge . The mitral valve has been repaired/replaced.  Moderate mitral valve regurgitation. No evidence of mitral stenosis.  Procedure Date: 05/18/2021.   5. The aortic valve is tricuspid. There is mild calcification of the  aortic valve. Aortic valve regurgitation is not visualized. Aortic valve  sclerosis is present, with no evidence of aortic valve stenosis.   6. The inferior vena cava is normal in size with greater than 50%  respiratory variability, suggesting right atrial pressure of 3 mmHg.   Imaging studies that I have independently reviewed today: Serial echocardiograms and echocardiogram from today.  Recent Labs: 10/12/2020: NT-Pro BNP 3,407 04/24/2021: TSH 4.014 05/25/2021: Magnesium 1.9 07/26/2021: B Natriuretic Peptide 234.1 09/13/2021: ALT 21; BUN 71; Creatinine 2.7; Hemoglobin 8.4; Platelets 249; Potassium 4.2; Sodium 140   Recent Lipid Panel Lab Results  Component Value Date/Time   CHOL 145 07/11/2021 08:11 AM   TRIG 92 07/11/2021 08:11 AM   HDL 48 07/11/2021 08:11 AM   LDLCALC 80 07/11/2021 08:11 AM    Risk Assessment/Calculations:          Physical Exam:    VS:  BP 122/60    Pulse 69    Ht 5' 4.5" (1.638 m)    Wt 232 lb (105.2 kg)    SpO2 96%    BMI 39.21 kg/m    Wt Readings from Last 3 Encounters:  09/18/21 232 lb (105.2 kg)  09/13/21 232 lb 3.5 oz (105.3 kg)  07/26/21 234 lb (106.1 kg)    GENERAL:  No apparent distress, AOx3 HEENT:  No carotid bruits, +2 carotid impulses, no scleral icterus CAR: RRR  soft systolic murmur best heart at  [place] without gallops, rubs, or thrills RES:  Clear to auscultation bilaterally ABD:  Soft, nontender, nondistended, positive bowel sounds x 4 VASC:  +2 radial pulses, +2 carotid pulses, palpable pedal pulses NEURO:  CN 2-12 grossly intact; motor and sensory grossly intact PSYCH:  No active depression or anxiety EXT:  2+ edema, ecchymosis, or cyanosis  Signed, Early Osmond, MD  09/18/2021 [address] [place], [place], [place]  [address] Phone: [phone]; Fax: [phone]   Note:  This document was prepared using Dragon voice recognition software and may include unintentional dictation errors.

## 2021-09-19 DIAGNOSIS — F411 Generalized anxiety disorder: Secondary | ICD-10-CM | POA: Diagnosis not present

## 2021-09-19 NOTE — Progress Notes (Incomplete)
[name]  [address] [place],    [address] [phone]  Clinic Day:  09/19/2021  Referring physician: [name], MD  This document serves as a record of services personally performed by Hosie Poisson, MD. It was created on their behalf by [facility], a trained medical scribe. The creation of this record is based on the scribe's personal observations and the provider's statements to them.  ASSESSMENT & PLAN:   Chronic anemia that has been worsening.  This is most likely anemia of chronic kidney disease/anemia of chronic disease.  Repeat iron studies do not reveal iron deficiency, soluble transferrin is pending to confirm.  LDH and bilirubin are normal, so hemolysis is unlikely the cause of her anemia, haptoglobin is pending to confirm.  Reticulocyte count is normal.  Additional evaluation of anemia with soluble transferrin, haptoglobin and serum protein electrophoresis is pending.  Stool Hemoccults will also be obtained.  If she is found to have definite iron deficiency, I will have her see her gastroenterologist for reevaluation.  If no specific etiology is found, we will likely recommend continued erythropoietin stimulating agents.  We will plan to see her back in 2 weeks to review the results.  I provided *** minutes of face-to-face time during this this encounter and > 50% was spent counseling as documented under my assessment and plan.    Dana Kaplan, MD [place] [address] [place] [address] Dept: [phone] Dept Fax: [phone]    CHIEF COMPLAINT:  CC: Iron deficiency  Current Treatment:  Observation   HISTORY OF PRESENT ILLNESS:  HIYA POINT is a 61 y.o. female with  a complicated medical history, including stage IV chronic kidney disease, who is referred in consultation by Dr. Biagio Quint, nephrology, for assessment and  management of iron deficiency.  She had worsening anemia, so was given a dose of Retacrit in December.  She then was then found to have a low iron saturation of 11% with a TIBC of 271 and ferritin 106 on February 10th, and gives a history of allergic reaction to IV iron, so was referred here.  She states she broke out in severe hives after receiving IV iron in the past 1 to 2 years.  She states she also has had blood transfusion on several occasions, most recent being in December 2022.  She has a history of hypoxic brain injury in June 2022, so has difficulty with dates.     She reports severe fatigue as well as dyspnea with limited exertion.  She denies pica to ice.  She gives a history of rectal bleeding in the remote past, but denies current rectal bleeding.  She denies melena.  She denies other overt form of blood loss.  She is on clopidogrel.   Review of her records reveal she received 1 unit of packed cells in November 2022 when her hemoglobin was 7.2.  She was given IV Feraheme in February 2022 when admitted for acute on chronic diastolic heart failure.  She reported severe generalized itching after receiving the first dose of Feraheme.  No hives were noted by the examining provider.  The 2nd dose of Feraheme was not given.  She was transfused in February 2022 when her hemoglobin was 7.8 and again in March 2022 when her hemoglobin was 7.   In 2017, when she reported rectal bleeding, as well as black tarry stools, EGD and colonoscopy were recommended.  EGD in September 2017 revealed a pyloric erosion.  I was unable to access the colonoscopy report from September 2017. She had EGD in September 2014 that was normal.  She was diagnosed with gastroparesis in September 2014.  It was noted in her chart that she had a colonoscopy in April 2014 which was normal, but I could not find a report.  She was also transfused in April 2013 at [facility] when her hemoglobin was 7.4.  She has had chronic anemia since  2010.   INTERVAL HISTORY:  I have reviewed her chart and materials related to her cancer extensively and collaborated history with the patient. Summary of oncologic history is as follows: Oncology History   No history exists.    Dana [name] is here for routine follow up ***.   Her  appetite is good, and she has gained/lost _ pounds since her last visit.  She denies fever, chills or other signs of infection.  She denies nausea, vomiting, bowel issues, or abdominal pain.  She denies sore throat, cough, dyspnea, or chest pain.  HISTORY:   Past Medical History:  Diagnosis Date   Acquired absence of left great toe (HCC)    Acquired absence of other left toe(s) ([place])    ACS (acute coronary syndrome) ([place])    Anemia    Anoxic brain injury ([place])    Anxiety    CAD (coronary artery disease)    DES mLAD 04/07/15; DES dRCA & DES pPDA 08/30/16; DES mCX & PTCA OM1 09/29/20   Chronic diastolic (congestive) heart failure (HCC)    Chronic pain    Chronic systolic (congestive) heart failure (HCC)    CKD (chronic kidney disease)    Contrast dye induced nephropathy, possible 09/03/2020   COPD (chronic obstructive pulmonary disease) (HCC)    Diabetes (HCC)    Diastolic CHF (HCC)    Dyspnea    Gastro-esophageal reflux disease without esophagitis    Hyperlipidemia    Hypertension    Hypertensive heart disease with heart failure (HCC)    Hypoxemia    Idiopathic gout, multiple sites    Localized edema    Low back pain    Mitral regurgitation    Mitral regurgitation    Mixed hyperlipidemia    Mixed incontinence    Morbid (severe) obesity due to excess calories (HCC)    Neuromuscular disorder (HCC)    Neuropathy    Non-ST elevation (NSTEMI) myocardial infarction ([place])    08/29/20, 09/29/20   OSA (obstructive sleep apnea) 09/03/2020   Other specified hypothyroidism    PAF (paroxysmal atrial fibrillation) (HCC)    s/p pulmonary vein isolation by cryoablation 10/04/15 Dr. Minna Merritts in [facility]   PEA  (Pulseless electrical activity) [facility]    ~ 01/13/21 at [facility] during admission DKA, volume overload, possible CAP, hypoxia s/p ACLS with ROSC ~ 10-15   Pneumonia    Primary insomnia    Rheumatoid arthritis ([place])    Stroke ([place])    Thyroid disease    Type 2 diabetes mellitus with diabetic nephropathy ([place])    Vitamin D deficiency     Past Surgical History:  Procedure Laterality Date   ABDOMINAL HYSTERECTOMY     Partial- Still has both ovaries   CARDIOVASCULAR STRESS TEST  08/13/2014   Nuclear; Normal   CARPAL TUNNEL RELEASE     CATARACT EXTRACTION, BILATERAL     CESAREAN SECTION     CHOLECYSTECTOMY     CORONARY ANGIOPLASTY WITH STENT PLACEMENT  3 blockages/ 1 stent   CORONARY STENT INTERVENTION N/A 09/29/2020   Procedure: CORONARY STENT INTERVENTION;  Surgeon: Sherren Mocha, MD;  Location: [facility];  Service: Cardiovascular;  Laterality: N/A;  CFX   EYE SURGERY     LEFT HEART CATH AND CORONARY ANGIOGRAPHY N/A 09/29/2020   Procedure: LEFT HEART CATH AND CORONARY ANGIOGRAPHY;  Surgeon: Sherren Mocha, MD;  Location: [place];  Service: Cardiovascular;  Laterality: N/A;   MITRAL VALVE REPAIR N/A 05/18/2021   Procedure: MITRAL VALVE REPAIR;  Surgeon: Early Osmond, MD;  Location: [facility];  Service: Cardiovascular;  Laterality: N/A;   RIGHT HEART CATH N/A 04/27/2021   Procedure: RIGHT HEART CATH;  Surgeon: Jolaine Artist, MD;  Location: [place];  Service: Cardiovascular;  Laterality: N/A;   RIGHT/LEFT HEART CATH AND CORONARY ANGIOGRAPHY N/A 01/07/2017   Procedure: Right/Left Heart Cath and Coronary Angiography;  Surgeon: Larey Dresser, MD;  Location: [facility];  Service: Cardiovascular;  Laterality: N/A;   ROTATOR CUFF REPAIR     TEE WITHOUT CARDIOVERSION N/A 04/28/2021   Procedure: TRANSESOPHAGEAL ECHOCARDIOGRAM (TEE);  Surgeon: Jolaine Artist, MD;  Location: [facility];  Service: Cardiovascular;  Laterality:  N/A;   TEE WITHOUT CARDIOVERSION N/A 05/18/2021   Procedure: TRANSESOPHAGEAL ECHOCARDIOGRAM (TEE);  Surgeon: Early Osmond, MD;  Location: [facility];  Service: Cardiovascular;  Laterality: N/A;   TOE AMPUTATION Left    US ECHOCARDIOGRAPHY  05/2017   Normal    Family History  Problem Relation Age of Onset   Coronary artery disease Mother    Hypertension Mother    Hyperlipidemia Mother    Diabetes type II Mother    Breast cancer Mother    Lung cancer Mother    Diabetes Father    Hypertension Father    Bipolar disorder Other    Depression Other    Schizophrenia Other    Mental illness Sister     Social History:  reports that she quit smoking about 39 years ago. Her smoking use included cigarettes. She started smoking about 40 years ago. She has never used smokeless tobacco. She reports that she does not drink alcohol and does not use drugs.The patient is {Blank single:19197::"alone","accompanied by"} *** today.  Allergies:  Allergies  Allergen Reactions   Naproxen Hives and Rash   Celecoxib Other (See Comments)    Stomach pain   Aspirin Other (See Comments)    CAN NOT TAKE DUE TO ULCERS (documented previously) but takes every day currently (as of 09/29/20) without reported issues Can take coated aspirin   Glucosamine Hives, Swelling and Other (See Comments)    Angioedema    Iron     IV iron transfusion - hives - Feb 2022 hospitalization   Metoclopramide Other (See Comments)    Facial twitching and stuttering   Metolazone Other (See Comments)    Acute renal failure   Shellfish-Derived Products Hives, Swelling and Other (See Comments)    Angioedema    Statins Other (See Comments)    Muscle pain   Tramadol Nausea And Vomiting         Current Medications: Current Outpatient Medications  Medication Sig Dispense Refill   allopurinol (ZYLOPRIM) 100 MG tablet Take 1 tablet by mouth daily.     amoxicillin (AMOXIL) 500 MG capsule TAKE FOUR CAPSULES BY MOUTH ONE  HOUR BEFORE DENTAL PROCEDURES AND CLEANINGS     Aspirin (VAZALORE) 81 MG CAPS Take 1 tablet  by mouth daily.     busPIRone (BUSPAR) 5 MG tablet TAKE ONE TABLET BY MOUTH THREE TIMES DAILY 180 tablet 0   carvedilol (COREG) 12.5 MG tablet Take 1 tablet (12.5 mg total) by mouth 2 (two) times daily. 60 tablet 11   Cholecalciferol (VITAMIN D) 50 MCG (2000 UT) tablet Take 2,000 Units by mouth daily. Take one daily     clopidogrel (PLAVIX) 75 MG tablet Take 1 tablet by mouth daily.     Cyanocobalamin 3000 MCG CAPS Take 2 capsules by mouth daily. Patient purchased 3000 mcg gummies and taking 2 gummies daily     EPINEPHrine 0.3 mg/0.3 mL IJ SOAJ injection Use as directed for life-threatening allergic reaction. 4 Device 3   Evolocumab (REPATHA SURECLICK) 742 MG/ML SOAJ Inject 1 Dose into the skin every 14 (fourteen) days. 6 mL 31   ezetimibe (ZETIA) 10 MG tablet Take 1 tablet by mouth daily.     ferrous sulfate 325 (65 FE) MG tablet Take 1 tablet by mouth daily.     hydrALAZINE (APRESOLINE) 25 MG tablet Take by mouth.     HYDROcodone-acetaminophen (NORCO) 7.5-325 MG tablet TAKE ONE TABLET BY MOUTH three times times daily AS NEEDED FOR moderate pain 90 tablet 0   insulin aspart (NOVOLOG FLEXPEN) 100 UNIT/ML FlexPen 2-10 U before meals based on SSI. 15 mL 3   Insulin Pen Needle (BD PEN NEEDLE NANO 2ND GEN) 32G X 4 MM MISC Inject 1 each into the skin in the morning, at noon, in the evening, and at bedtime. 600 each 3   isosorbide mononitrate (IMDUR) 30 MG 24 hr tablet Take 1 tablet by mouth daily.     lactulose (CHRONULAC) 10 GM/15ML solution Take 15 mLs (10 g total) by mouth daily as needed for mild constipation. 236 mL 1   latanoprost (XALATAN) 0.005 % ophthalmic solution Place 1 drop into both eyes at bedtime.     LEVEMIR FLEXTOUCH 100 UNIT/ML FlexTouch Pen Inject 50 Units into the skin daily. 15 mL 1   LORazepam (ATIVAN) 0.5 MG tablet Take 1 tablet (0.5 mg total) by mouth daily as needed for anxiety. 30  tablet 2   magnesium oxide (MAG-OX) 400 MG tablet Take 2 tablets (800 mg total) by mouth daily. Take 2 (400 mg) daily=800 mg 60 tablet 1   metolazone (ZAROXOLYN) 2.5 MG tablet Take 1 tablet (2.5 mg total) by mouth daily. 2 tablet 0   nitroGLYCERIN (NITROSTAT) 0.4 MG SL tablet Place 1 tablet (0.4 mg total) under the tongue every 5 (five) minutes as needed for chest pain. 25 tablet 3   Omega-3 Fatty Acids (FISH OIL PO) Take 1 capsule by mouth daily.     OXYGEN Inhale 2 L into the lungs at bedtime.     pantoprazole (PROTONIX) 40 MG tablet Take 1 tablet (40 mg total) by mouth daily. 90 tablet 1   potassium chloride SA (KLOR-CON M) 20 MEQ tablet Take 1 tablet by mouth daily.     Probiotic CAPS Take 1 capsule by mouth daily.     promethazine (PHENERGAN) 25 MG tablet Take by mouth.     torsemide (DEMADEX) 20 MG tablet Take 3 tablets (60 mg total) by mouth daily. 90 tablet 6   No current facility-administered medications for this visit.    REVIEW OF SYSTEMS:  Review of Systems - Oncology    VITALS:  There were no vitals taken for this visit.  Wt Readings from Last 3 Encounters:  09/18/21 232 lb (105.2 kg)  09/13/21 232 lb 3.5 oz (105.3 kg)  07/26/21 234 lb (106.1 kg)    There is no height or weight on file to calculate BMI.  Performance status (ECOG): {CHL ONC [id number]  PHYSICAL EXAM:  Physical Exam   LABS:   CBC Latest Ref Rng & Units 09/13/2021 08/28/2021 08/25/2021  WBC - 10.3 - 9.7  Hemoglobin 12.0 - 16.0 8.4(A) 8.2(L) 7.7(L)  Hematocrit 36 - 46 25(A) 24.6(L) 24.2(L)  Platelets 150 - 399 249 - 247   CMP Latest Ref Rng & Units 09/13/2021 08/25/2021 07/26/2021  Glucose 70 - 99 mg/dL - 155(H) 75  BUN 4 - 21 71(A) 64(H) 50(H)  Creatinine 0.5 - 1.1 2.7(A) 2.61(H) 2.29(H)  Sodium 137 - 147 140 139 139  Potassium 3.4 - 5.3 4.2 5.0 4.5  Chloride 99 - 108 103 98 102  CO2 13 - 22 27(A) 25 28  Calcium 8.7 - 10.7 9.1 8.9 9.0  Total Protein 6.5 - 8.1 g/dL - - 7.6  Total Bilirubin 0.3  - 1.2 mg/dL - - 0.5  Alkaline Phos 25 - 125 131(A) - 125  AST 13 - 35 29 - 18  ALT 7 - 35 21 - 15    Lab Results  Component Value Date   TOTALPROTELP 7.3 09/13/2021   ALBUMINELP 3.5 09/13/2021   A1GS 0.3 09/13/2021   A2GS 0.8 09/13/2021   BETS 1.0 09/13/2021   GAMS 1.8 09/13/2021   MSPIKE Not Observed 09/13/2021   Lab Results  Component Value Date   TIBC 328 09/13/2021   TIBC 271 08/25/2021   TIBC 340 04/26/2021   FERRITIN 64 09/13/2021   FERRITIN 106 08/25/2021   FERRITIN 76 04/26/2021   IRONPCTSAT 12 09/13/2021   IRONPCTSAT 11 (L) 08/25/2021   IRONPCTSAT 13 04/26/2021   Lab Results  Component Value Date   LDH 176 09/13/2021    STUDIES:  ECHOCARDIOGRAM COMPLETE  Result Date: 09/18/2021    ECHOCARDIOGRAM REPORT   Patient Name:   Dana [name] Date of Exam: 09/18/2021 Medical Rec #:  [id number]      Height:       64.5 in Accession #:    [id number]     Weight:       232.2 lb Date of Birth:  12/15/1960      BSA:          2.096 m Patient Age:    66 years       BP:           152/67 mmHg Patient Gender: F              HR:           70 bpm. Exam Location:  [address] Procedure: 2D Echo, Cardiac Doppler, Color Doppler and Intracardiac            Opacification Agent Indications:    I34.0 Nonrheumatic mitral (valve) insufficiency  History:        Patient has prior history of Echocardiogram examinations, most                 recent 07/12/2021. CHF, CAD, COPD; Risk Factors:Dyslipidemia and                 Hypertension. Mitral valve clip.  Sonographer:    Diamond Nickel RCS Referring Phys: Early Osmond IMPRESSIONS  1. Left ventricular ejection fraction, by estimation, is 55 to 60%. The left ventricle has normal function. The left ventricle has no regional wall motion abnormalities.  Left ventricular diastolic parameters are indeterminate.  2. Right ventricular systolic function is normal. The right ventricular size is normal.  3. S/p MitraClip x2 (05/18/21) with XTW between A2/P2 and second  NTW lateral to this. Dual mitral inflows present. Peak and mean gradients through the valve are 22 and 8 mm Hg respectively(68 bppm) increased from 12 and 4.5 mm Hg from previous echo MR appears moderate, unchanged from echo in Dec 2022.  4. The aortic valve is tricuspid. Aortic valve regurgitation is not visualized. FINDINGS  Left Ventricle: Left ventricular ejection fraction, by estimation, is 55 to 60%. The left ventricle has normal function. The left ventricle has no regional wall motion abnormalities. The left ventricular internal cavity size was normal in size. There is  no left ventricular hypertrophy. Left ventricular diastolic parameters are indeterminate. Right Ventricle: The right ventricular size is normal. Right vetricular wall thickness was not assessed. Right ventricular systolic function is normal. Left Atrium: Left atrial size was normal in size. Right Atrium: Right atrial size was normal in size. Pericardium: Trivial pericardial effusion is present. Mitral Valve: S/p MitraClip x2 (05/18/21) with XTW between A2/P2 and second NTW lateral to this. Dual mitral inflows present. Peak and mean gradients through the valve are 22 and 8 mm Hg respectively(68 bppm) increased from 12 and 4.5 mm Hg from previous echo MR appears moderate, unchanged from echo in Dec 2022. MV peak gradient, 22.2 mmHg. The mean mitral valve gradient is 7.5 mmHg. Tricuspid Valve: The tricuspid valve is normal in structure. Tricuspid valve regurgitation is mild. Aortic Valve: The aortic valve is tricuspid. Aortic valve regurgitation is not visualized. Pulmonic Valve: The pulmonic valve was normal in structure. Pulmonic valve regurgitation is trivial. Aorta: The aortic root is normal in size and structure. IAS/Shunts: No atrial level shunt detected by color flow Doppler.  LEFT VENTRICLE PLAX 2D LVIDd:         5.00 cm LVIDs:         3.50 cm LV PW:         1.00 cm LV IVS:        1.10 cm LVOT diam:     1.85 cm LV SV:         66 LV SV  Index:   31 LVOT Area:     2.69 cm  RIGHT VENTRICLE RV Basal diam:  3.50 cm TAPSE (M-mode): 1.6 cm LEFT ATRIUM             Index        RIGHT ATRIUM           Index LA diam:        3.90 cm 1.86 cm/m   RA Area:     17.60 cm LA Vol (A2C):   74.3 ml 35.45 ml/m  RA Volume:   51.90 ml  24.76 ml/m LA Vol (A4C):   63.7 ml 30.40 ml/m LA Biplane Vol: 73.1 ml 34.88 ml/m  AORTIC VALVE LVOT Vmax:   88.60 cm/s LVOT Vmean:  61.600 cm/s LVOT VTI:    0.244 m  AORTA Ao Root diam: 2.60 cm Ao Asc diam:  3.00 cm MITRAL VALVE MV Area (PHT): 2.39 cm     SHUNTS MV Area VTI:   1.05 cm     Systemic VTI:  0.24 m MV Peak grad:  22.2 mmHg    Systemic Diam: 1.85 cm MV Mean grad:  7.5 mmHg MV Vmax:       2.36 m/s MV Vmean:  122.0 cm/s MV Decel Time: 317 msec MR Peak grad: 65.3 mmHg MR Mean grad: 47.0 mmHg MR Vmax:      404.00 cm/s MR Vmean:     325.0 cm/s MV E velocity: 216.00 cm/s MV A velocity: 116.00 cm/s MV E/A ratio:  1.86 Dorris Carnes MD Electronically signed by Dorris Carnes MD Signature Date/Time: 09/18/2021/8:58:28 PM    Final       I, Rita Ohara, am acting as scribe for Dana Kaplan, MD  I have reviewed this report as typed by the medical scribe, and it is complete and accurate.

## 2021-09-19 NOTE — Addendum Note (Signed)
Addended by: Harland German A on: 09/19/2021 09:25 AM ? ? Modules accepted: Orders ? ?

## 2021-09-22 NOTE — Progress Notes (Signed)
Advanced Heart Failure Clinic Note   PCP: CoxElnita Maxwell, MD PCP-Cardiologist: [name], MD   Reason for Visit: Hospital f/u acute on chronic diastolic HF  HPI: Ms Sweeten is 61 y.o. female with history of diastolic heart failure s/p cardiomems 02/2018, CAD, s/p DES RCA and LAD 2018, DMI, htn, hyperlipidemia, PAD, and OSA on CPAP, PAF s/p ablation, PAD s/p L great toe and 4th toe amputation, stage IIIb CKD.   Admitted 03/22 with NSTEMI and a/c CHF. LHC 03/22 showed moderate mid-LAD and mid-RCA disease, severe mid-L-Cx stenosis s/p PCI/DES mid Cx and side branch angioplasty of OM1.   Unfortunately she developed a contrast-induced nephropathy after cath and creatinine increased from 2.2 to a peak of 5.5. Additionally, she developed severe nausea and vomiting, gastroparesis and required IV antiplatelet therapy.  During this time she developed epistaxis and acute blood loss anemia requiring transfusion.  She was switched back to oral DAPT. Diuretics were reintroduced and her creatinine began to improve over the following days. Her leukocytosis persisted but all her infectious workup was negative and this was felt to be reactive.    Per chart review, she was admitted 6/22 to [facility] for DKA, started on insulin gtt, IVF and diuretics held. Developed pulmonary vascular congestion vs PNA and was started on IV abx, and diuretics were resumed. She suffered a PEA arrest 01/12/21 with ROSC after 10-15 minutes of CPR and was transferred to [place]; precipitating factor for PEA arrest thought to be respiratory driven. On arrival, she was still in DKA, AKI on CKD with oliguria and Nephrology was consulted for further management. TTE with EF 60%, min MR/TR. Concern for stroke due to left-sided weakness and MRI confirmed anoxic brain injury, but no definite infarct. She was extubated, SCr returned to baseline of 1.6-1.8 and she was discharged to rehab facility.   Zio placed 8/22 for palpitations, showing mostly  SB/SR no afib, 66 SVT runs, frequent isolated SVE (11.2%).  Admitted from AHF clinic 10/22 for A/C CHF after failing outpatient escalation in diuretic therapy. She was enrolled in FASTR trial and diuresed > 15 lb.  Hospitalization complicated by AKI on CKD, Scr up to 3.13. RHC showed mild to moderate elevating filling pressures with large v-waves in PCWP tracing suggesting severe MR vs severe diastolic dysfunction. TEE 55-60% showed severe central MR.   Patient underwent TEER of mitral valve with placement of 1XT W and 1NT W clip on 05/18/2021. NT W clip was entangled and had to be deployed into lateral part of valve. No additional clip placement could be preformed. Echo 11/04 with EF 50-55% and residual moderate MR. Given 1 dose IV lasix prior to discharge.   Follow up 1/23 doing CR 3-4x/week. Volume mildly up, torsemide doubled x 2 days.   Echo 3/23 showed EF 55-60%, RV OK, moderate MR mean gradient 7-8 mmHg.  Today she returns for HF follow up with her husband. Overall feeling "crappy." Wants to do CR but advised she needs to get hgb >8.5 first. She is now followed by Heme/Onc to work up her anemia. Saw [name] and MR now moderate, no indication for 3rd clip. She is SOB when she gets up and moves around, some light-headedness. Having some swelling in legs. Denies palpitations, CP,  or PND/Orthopnea. Appetite ok. No fever or chills. Weight at home 232 pounds. Taking all medications.    Cardiac Studies: - Echo (3/23): EF 55-60%, RV normal, moderate MR - Echo 11/22: EF 50-55%, residual moderate MR  - RHC (10/22):  RA = 8 RV = 59/9 PA = 61/17 (35) PCW = mean 23 (v = 49) -> performed multiple wedges Fick cardiac output/index = PVR = 1.6 WU FA sat = 98% PA sat = 69%, 70% PaPi = 5.5 Assessment: 1. Mild to moderately elevated filling pressures with very large v-waves in PCWP tracing suggestive of severe diastolic dysfunction vs severe MR  - TEE (10/22): EF 55-60%, basal inferior  akinesis, severe central MR. - Echo (10/22): EF 65-70%, RV ok, moderate MR - Echo (5/21): EF 60-65%, RV ok, Grade II DD. - Echo (2/22): EF 55-60%, RV ok, Grade II DD, mod MR - Echo (3/22): EF 55-60%, Grade II DD, normal RV - Echo (6/22): EF 60%, mod MR/TR (per chart review at [place]). - Zio (8/19): no arrhythmias, occasional PAC/PVCs - Zio (9/22): mostly SB/SR no afib, 66 SVT runs, frequent isolated SVE (11.2%).  - LHC (3/22): 3 vessel CAD with moderate mid-LAD and mid-RCA stenoses, and severe mid-LCx stenosis; successful PCI of the mid-circumflex, reducing severe 90% stenosis to 0% with a 2.75x22 mm Resolute Onyx DES, provisional side branch angioplasty of the OM1 with a 2.5 mm balloon through the stent struts  Review of systems complete and found to be negative unless listed in HPI.    Past Medical History:  Diagnosis Date   Acquired absence of left great toe (HCC)    Acquired absence of other left toe(s) ([place])    ACS (acute coronary syndrome) ([place])    Anemia    Anoxic brain injury ([place])    Anxiety    CAD (coronary artery disease)    DES mLAD 04/07/15; DES dRCA & DES pPDA 08/30/16; DES mCX & PTCA OM1 09/29/20   Chronic diastolic (congestive) heart failure (HCC)    Chronic pain    Chronic systolic (congestive) heart failure (HCC)    CKD (chronic kidney disease)    Contrast dye induced nephropathy, possible 09/03/2020   COPD (chronic obstructive pulmonary disease) (HCC)    Diabetes (HCC)    Diastolic CHF (HCC)    Dyspnea    Gastro-esophageal reflux disease without esophagitis    Hyperlipidemia    Hypertension    Hypertensive heart disease with heart failure (HCC)    Hypoxemia    Idiopathic gout, multiple sites    Localized edema    Low back pain    Mitral regurgitation    Mitral regurgitation    Mixed hyperlipidemia    Mixed incontinence    Morbid (severe) obesity due to excess calories (HCC)    Neuromuscular disorder (HCC)    Neuropathy    Non-ST elevation (NSTEMI)  myocardial infarction ([place])    08/29/20, 09/29/20   OSA (obstructive sleep apnea) 09/03/2020   Other specified hypothyroidism    PAF (paroxysmal atrial fibrillation) (HCC)    s/p pulmonary vein isolation by cryoablation 10/04/15 Dr. Minna Merritts in [facility]   PEA (Pulseless electrical activity) [facility])    ~ 01/13/21 at [facility] during admission DKA, volume overload, possible CAP, hypoxia s/p ACLS with ROSC ~ 10-15   Pneumonia    Primary insomnia    Rheumatoid arthritis ([place])    Stroke ([place])    Thyroid disease    Type 2 diabetes mellitus with diabetic nephropathy (HCC)    Vitamin D deficiency     Current Outpatient Medications  Medication Sig Dispense Refill   allopurinol (ZYLOPRIM) 100 MG tablet Take 1 tablet by mouth daily.     amoxicillin (AMOXIL) 500 MG capsule TAKE FOUR CAPSULES BY  MOUTH ONE HOUR BEFORE DENTAL PROCEDURES AND CLEANINGS     Aspirin (VAZALORE) 81 MG CAPS Take 1 tablet by mouth daily.     busPIRone (BUSPAR) 5 MG tablet TAKE ONE TABLET BY MOUTH THREE TIMES DAILY 180 tablet 0   carvedilol (COREG) 12.5 MG tablet Take 1 tablet (12.5 mg total) by mouth 2 (two) times daily. 60 tablet 11   Cholecalciferol (VITAMIN D) 50 MCG (2000 UT) tablet Take 2,000 Units by mouth daily. Take one daily     clopidogrel (PLAVIX) 75 MG tablet Take 1 tablet by mouth daily.     Cyanocobalamin 3000 MCG CAPS Take 2 capsules by mouth daily. Patient purchased 3000 mcg gummies and taking 2 gummies daily     EPINEPHrine 0.3 mg/0.3 mL IJ SOAJ injection Use as directed for life-threatening allergic reaction. 4 Device 3   Evolocumab (REPATHA SURECLICK) 876 MG/ML SOAJ Inject 1 Dose into the skin every 14 (fourteen) days. 6 mL 31   ezetimibe (ZETIA) 10 MG tablet Take 1 tablet by mouth daily.     ferrous sulfate 325 (65 FE) MG tablet Take 1 tablet by mouth daily.     hydrALAZINE (APRESOLINE) 25 MG tablet Take 25 mg by mouth 3 (three) times daily.     HYDROcodone-acetaminophen (NORCO) 7.5-325 MG tablet TAKE ONE  TABLET BY MOUTH three times times daily AS NEEDED FOR moderate pain 90 tablet 0   insulin aspart (NOVOLOG FLEXPEN) 100 UNIT/ML FlexPen 2-10 U before meals based on SSI. 15 mL 3   Insulin Pen Needle (BD PEN NEEDLE NANO 2ND GEN) 32G X 4 MM MISC Inject 1 each into the skin in the morning, at noon, in the evening, and at bedtime. 600 each 3   isosorbide mononitrate (IMDUR) 30 MG 24 hr tablet Take 1 tablet by mouth daily.     lactulose (CHRONULAC) 10 GM/15ML solution Take 15 mLs (10 g total) by mouth daily as needed for mild constipation. 236 mL 1   latanoprost (XALATAN) 0.005 % ophthalmic solution Place 1 drop into both eyes at bedtime.     LEVEMIR FLEXTOUCH 100 UNIT/ML FlexTouch Pen Inject 50 Units into the skin daily. 15 mL 1   LORazepam (ATIVAN) 0.5 MG tablet Take 1 tablet (0.5 mg total) by mouth daily as needed for anxiety. 30 tablet 2   magnesium oxide (MAG-OX) 400 MG tablet Take 2 tablets (800 mg total) by mouth daily. Take 2 (400 mg) daily=800 mg 60 tablet 1   nitroGLYCERIN (NITROSTAT) 0.4 MG SL tablet Place 1 tablet (0.4 mg total) under the tongue every 5 (five) minutes as needed for chest pain. 25 tablet 3   Omega-3 Fatty Acids (FISH OIL PO) Take 1 capsule by mouth daily.     OXYGEN Inhale 2 L into the lungs at bedtime.     pantoprazole (PROTONIX) 40 MG tablet Take 1 tablet (40 mg total) by mouth daily. 90 tablet 1   potassium chloride SA (KLOR-CON M) 20 MEQ tablet Take 1 tablet by mouth daily.     Probiotic CAPS Take 1 capsule by mouth daily.     promethazine (PHENERGAN) 25 MG tablet Take by mouth as needed.     torsemide (DEMADEX) 20 MG tablet Take 3 tablets (60 mg total) by mouth daily. 90 tablet 6   No current facility-administered medications for this encounter.   Allergies  Allergen Reactions   Naproxen Hives and Rash   Celecoxib Other (See Comments)    Stomach pain   Aspirin Other (See Comments)  CAN NOT TAKE DUE TO ULCERS (documented previously) but takes every day currently  (as of 09/29/20) without reported issues Can take coated aspirin   Glucosamine Hives, Swelling and Other (See Comments)    Angioedema    Iron     IV iron transfusion - hives - Feb 2022 hospitalization   Metoclopramide Other (See Comments)    Facial twitching and stuttering   Metolazone Other (See Comments)    Acute renal failure   Shellfish-Derived Products Hives, Swelling and Other (See Comments)    Angioedema    Statins Other (See Comments)    Muscle pain   Tramadol Nausea And Vomiting        Social History   Socioeconomic History   Marital status: Married    Spouse name: Not on file   Number of children: 2   Years of education: 16   Highest education level: Master's degree (e.g., MA, MS, MEng, MEd, MSW, MBA)  Occupational History   Occupation: on disability/retired early former Education officer, museum  Tobacco Use   Smoking status: Former    Types: Cigarettes    Start date: 1983    Quit date: 1984    Years since quitting: 39.2   Smokeless tobacco: Never  Vaping Use   Vaping Use: Never used  Substance and Sexual Activity   Alcohol use: Never   Drug use: Never   Sexual activity: Not on file  Other Topics Concern   Not on file  Social History Narrative   Not on file   Social Determinants of Health   Financial Resource Strain: Low Risk    Difficulty of Paying Living Expenses: Not very hard  Food Insecurity: No Food Insecurity   Worried About Charity fundraiser in the Last Year: Never true   Ran Out of Food in the Last Year: Never true  Transportation Needs: No Transportation Needs   Lack of Transportation (Medical): No   Lack of Transportation (Non-Medical): No  Physical Activity: Not on file  Stress: Not on file  Social Connections: Not on file  Intimate Partner Violence: Not on file   Family History  Problem Relation Age of Onset   Coronary artery disease Mother    Hypertension Mother    Hyperlipidemia Mother    Diabetes type II Mother    Breast cancer  Mother    Lung cancer Mother    Diabetes Father    Hypertension Father    Bipolar disorder Other    Depression Other    Schizophrenia Other    Mental illness Sister    BP (!) 138/58    Pulse 68    Wt 105.8 kg (233 lb 3.2 oz)    SpO2 97%    BMI 39.41 kg/m   Wt Readings from Last 3 Encounters:  09/25/21 105.8 kg (233 lb 3.2 oz)  09/18/21 105.2 kg (232 lb)  09/13/21 105.3 kg (232 lb 3.5 oz)   PHYSICAL EXAM: General:  NAD. No resp difficulty, arrived in [facility], pale HEENT: Normal Neck: Supple. JVP 7-8. Carotids 2+ bilat; no bruits. No lymphadenopathy or thryomegaly appreciated. Cor: PMI nondisplaced. Regular rate & rhythm. No rubs, gallops or murmurs. Lungs: Clear Abdomen: Obese, nontender, nondistended. No hepatosplenomegaly. No bruits or masses. Good bowel sounds. Extremities: No cyanosis, clubbing, rash, 1-2+ BLE edema to knees Neuro: Alert & oriented x 3, cranial nerves grossly intact. Moves all 4 extremities w/o difficulty. Affect pleasant.  CardioMEMs: Goal dPAP: 10  Today's dPAP: 12 (personally reviewed).  ASSESSMENT &  PLAN: 1.  Chronic Diastolic Heart Failure - Echo 08/2020 EF 55-60%  - Echo (3/22): EF 55-60%, Grade II DD, normal RV - Echo (6/22): EF 60%, mod MR/TR (per chart review at [place]). - RHC (10/22): mild to moderate elevated filling pressures, large v-waves PCWP tracing - TEE (10/22): EF 55-60%, basal inferior akinesis, severe central MR. - Recent admit 10/22 with a/c CHF, enrolled in FASTR trial and diuresed 15 lbs. Severe MR likely contributing to decompensation.  - Echo (11/22): EF 50-55%, severe LV HK, moderate MR - NYHA IIIb, confounded by anemia. Volume up on exam and by Cardiomems. - Double torsemide x 2 days (60 mg bid) then increase to 80 mg daily. - Continue carvedilol 12.5 mg bid. - Continue hydralazine 25 mg tid + Imdur 30 mg daily. - No SGLT2i with T1DM. - Continue TED hose. - BMET/BNP today, repeat BMET in 10-14 days at PCP.   2. CAD - Known CAD  with prior stents to mid LAD and pRCA.  - NSTEMI (2/22). Cath deferred due to AKI. Treated medically.  - NSTEMI (7/22)-->S/p PCI/DES to mid Cx, balloon angioplasty to OM1  - No s/s angina - Ideally continue ASA + Plavix for a minimum of 12 months post DES  - Lipids under good control w/ Repatha + Zetia (LDL 66 7/22). Followed by Dr. Debara Pickett.    3. CKD IIIb - Prior h/o CIN following LHC - Previous SCr baseline 1.6-1.8 with multiple AKIs over the last year. New baseline ~ 2.4  - Followed by Nephrology in [facility] - SCr 2.32 07/11/21 - BMET today.  4. Mitral regurgitation - Severe central MR on TEE on 10/22.   - S/p TEER 05/18/2021 - two clips placed, one became entangled and had to be deployed. - Moderate residual MR on echo 11/22. - Repeat echo 12/22 with stable moderate MR, echo 3/23 showed moderate MR. - Structural team following. No indication for re-clip, per patient report.   5. OSA - CPAP qhs.    6. DM1 - Hgb A1c 6.6 10/22. - No SGLT2i.   7. Chronic Anemia - Baseline 8-9, likely 2/2 renal disease.  - Hx severe epistaxis and ABLA requiring transfusion. Hx rash with IV iron. - Received Procrit through nephrology, now on hold for anemia work up with heme/onc. - Now following with Heme/Onc.  8. Palpitations - Hx PAF s/p ablation at least 10 years ago. No documented recurrence. Not currently on anticoagulation. Would need to consider risk/benefits of anticoagulation if recurs especially with history of anemia requiring transfusion - Zio XT 14 day (8/22) showed mostly SB/SR no afib, 66 SVT runs, frequent isolated SVE (11.2%). - Symptomatically improved - Continue carvedilol 12.5 mg bid.  Follow up in 6-8 weeks with APP (volume check and increase hydralazine) and 4 months with Dr. [name].  Rafael Bihari, FNP  3:05 PM

## 2021-09-24 ENCOUNTER — Other Ambulatory Visit: Payer: Self-pay | Admitting: Oncology

## 2021-09-24 DIAGNOSIS — D649 Anemia, unspecified: Secondary | ICD-10-CM

## 2021-09-25 ENCOUNTER — Other Ambulatory Visit: Payer: Self-pay

## 2021-09-25 ENCOUNTER — Encounter (HOSPITAL_COMMUNITY): Payer: Self-pay

## 2021-09-25 ENCOUNTER — Ambulatory Visit (HOSPITAL_COMMUNITY)
Admission: RE | Admit: 2021-09-25 | Discharge: 2021-09-25 | Disposition: A | Discharge status: Home / Self Care | Payer: HMO | Source: Ambulatory Visit | Attending: Family Medicine | Admitting: Family Medicine

## 2021-09-25 VITALS — BP 138/58 | HR 68 | Wt 233.2 lb

## 2021-09-25 DIAGNOSIS — Z9989 Dependence on other enabling machines and devices: Secondary | ICD-10-CM | POA: Diagnosis not present

## 2021-09-25 DIAGNOSIS — Z87898 Personal history of other specified conditions: Secondary | ICD-10-CM | POA: Insufficient documentation

## 2021-09-25 DIAGNOSIS — D631 Anemia in chronic kidney disease: Secondary | ICD-10-CM

## 2021-09-25 DIAGNOSIS — I2510 Atherosclerotic heart disease of native coronary artery without angina pectoris: Secondary | ICD-10-CM | POA: Insufficient documentation

## 2021-09-25 DIAGNOSIS — Z7902 Long term (current) use of antithrombotics/antiplatelets: Secondary | ICD-10-CM | POA: Insufficient documentation

## 2021-09-25 DIAGNOSIS — E782 Mixed hyperlipidemia: Secondary | ICD-10-CM | POA: Insufficient documentation

## 2021-09-25 DIAGNOSIS — Z955 Presence of coronary angioplasty implant and graft: Secondary | ICD-10-CM | POA: Diagnosis not present

## 2021-09-25 DIAGNOSIS — N1832 Chronic kidney disease, stage 3b: Secondary | ICD-10-CM | POA: Insufficient documentation

## 2021-09-25 DIAGNOSIS — E1043 Type 1 diabetes mellitus with diabetic autonomic (poly)neuropathy: Secondary | ICD-10-CM | POA: Insufficient documentation

## 2021-09-25 DIAGNOSIS — R040 Epistaxis: Secondary | ICD-10-CM | POA: Insufficient documentation

## 2021-09-25 DIAGNOSIS — E1022 Type 1 diabetes mellitus with diabetic chronic kidney disease: Secondary | ICD-10-CM | POA: Diagnosis not present

## 2021-09-25 DIAGNOSIS — Z8674 Personal history of sudden cardiac arrest: Secondary | ICD-10-CM | POA: Insufficient documentation

## 2021-09-25 DIAGNOSIS — G4733 Obstructive sleep apnea (adult) (pediatric): Secondary | ICD-10-CM | POA: Insufficient documentation

## 2021-09-25 DIAGNOSIS — Z7982 Long term (current) use of aspirin: Secondary | ICD-10-CM | POA: Diagnosis not present

## 2021-09-25 DIAGNOSIS — Z794 Long term (current) use of insulin: Secondary | ICD-10-CM | POA: Insufficient documentation

## 2021-09-25 DIAGNOSIS — I252 Old myocardial infarction: Secondary | ICD-10-CM | POA: Insufficient documentation

## 2021-09-25 DIAGNOSIS — E1051 Type 1 diabetes mellitus with diabetic peripheral angiopathy without gangrene: Secondary | ICD-10-CM | POA: Diagnosis not present

## 2021-09-25 DIAGNOSIS — R002 Palpitations: Secondary | ICD-10-CM

## 2021-09-25 DIAGNOSIS — I340 Nonrheumatic mitral (valve) insufficiency: Secondary | ICD-10-CM | POA: Diagnosis not present

## 2021-09-25 DIAGNOSIS — I130 Hypertensive heart and chronic kidney disease with heart failure and stage 1 through stage 4 chronic kidney disease, or unspecified chronic kidney disease: Secondary | ICD-10-CM | POA: Insufficient documentation

## 2021-09-25 DIAGNOSIS — I5032 Chronic diastolic (congestive) heart failure: Secondary | ICD-10-CM | POA: Diagnosis not present

## 2021-09-25 DIAGNOSIS — I480 Paroxysmal atrial fibrillation: Secondary | ICD-10-CM | POA: Diagnosis not present

## 2021-09-25 DIAGNOSIS — Z89422 Acquired absence of other left toe(s): Secondary | ICD-10-CM | POA: Insufficient documentation

## 2021-09-25 DIAGNOSIS — N179 Acute kidney failure, unspecified: Secondary | ICD-10-CM | POA: Diagnosis not present

## 2021-09-25 DIAGNOSIS — Z8673 Personal history of transient ischemic attack (TIA), and cerebral infarction without residual deficits: Secondary | ICD-10-CM | POA: Insufficient documentation

## 2021-09-25 DIAGNOSIS — D500 Iron deficiency anemia secondary to blood loss (chronic): Secondary | ICD-10-CM | POA: Diagnosis not present

## 2021-09-25 DIAGNOSIS — Z79899 Other long term (current) drug therapy: Secondary | ICD-10-CM | POA: Insufficient documentation

## 2021-09-25 DIAGNOSIS — D72829 Elevated white blood cell count, unspecified: Secondary | ICD-10-CM | POA: Diagnosis not present

## 2021-09-25 DIAGNOSIS — N184 Chronic kidney disease, stage 4 (severe): Secondary | ICD-10-CM

## 2021-09-25 DIAGNOSIS — Z89412 Acquired absence of left great toe: Secondary | ICD-10-CM | POA: Diagnosis not present

## 2021-09-25 DIAGNOSIS — Z6839 Body mass index (BMI) 39.0-39.9, adult: Secondary | ICD-10-CM | POA: Insufficient documentation

## 2021-09-25 LAB — BASIC METABOLIC PANEL WITH GFR
Anion gap: 9 (ref 5–15)
BUN: 62 mg/dL — ABNORMAL HIGH (ref 6–20)
CO2: 26 mmol/L (ref 22–32)
Calcium: 9 mg/dL (ref 8.9–10.3)
Chloride: 103 mmol/L (ref 98–111)
Creatinine, Ser: 2.16 mg/dL — ABNORMAL HIGH (ref 0.44–1.00)
GFR, Estimated: 26 mL/min — ABNORMAL LOW (ref >60)
Glucose, Bld: 184 mg/dL — ABNORMAL HIGH (ref 70–99)
Potassium: 4.8 mmol/L (ref 3.5–5.1)
Sodium: 138 mmol/L (ref 135–145)

## 2021-09-25 LAB — BRAIN NATRIURETIC PEPTIDE: B Natriuretic Peptide: 149 pg/mL — ABNORMAL HIGH (ref 0.0–100.0)

## 2021-09-25 MED ORDER — TORSEMIDE 20 MG PO TABS: 80.0000 mg | ORAL_TABLET | Freq: Every day | ORAL | 5 refills | Status: DC

## 2021-09-25 NOTE — Patient Instructions (Addendum)
Thank you for coming in today ? ?Labs were done today, if any labs are abnormal the clinic will call you ? ?INCREASE Torsemide to 60 mg 3 tablets twice daily for 2 days ONLY ?Then 80 mg 4 tablets daily  ? ?Your physician recommends that you schedule a follow-up appointment in:  ?6-8 weeks in clinic  ?4 months with Dr. [name] ? ?At the [facility], you and your health needs are our priority. As part of our continuing mission to provide you with exceptional heart care, we have created designated Provider Care Teams. These Care Teams include your primary Cardiologist (physician) and Advanced Practice Providers (APPs- Physician Assistants and Nurse Practitioners) who all work together to provide you with the care you need, when you need it.  ? ?You may see any of the following providers on your designated Care Team at your next follow up: ?Dr [name] ?Dr Loralie Champagne ?Darrick Grinder, NP ?Lyda Jester, PA ?Jessica Milford,NP ?Marlyce Huge, PA ?Audry Riles, PharmD ? ? ?Please be sure to bring in all your medications bottles to every appointment.  ? ?If you have any questions or concerns before your next appointment please send [name] a message through [place] or call our office at [phone].   ? ?TO LEAVE A MESSAGE FOR THE NURSE SELECT OPTION 2, PLEASE LEAVE A MESSAGE INCLUDING: ?YOUR NAME ?DATE OF BIRTH ?CALL BACK NUMBER ?REASON FOR CALL**this is important as we prioritize the call backs ? ?YOU WILL RECEIVE A CALL BACK THE SAME DAY AS LONG AS YOU CALL BEFORE 4:00 PM ? ? ? ? ? ?

## 2021-09-26 ENCOUNTER — Telehealth: Payer: Self-pay

## 2021-09-26 ENCOUNTER — Other Ambulatory Visit: Payer: Self-pay | Admitting: Pharmacist

## 2021-09-26 ENCOUNTER — Inpatient Hospital Stay (INDEPENDENT_AMBULATORY_CARE_PROVIDER_SITE_OTHER): Payer: HMO | Admitting: Oncology

## 2021-09-26 ENCOUNTER — Encounter: Payer: Self-pay | Admitting: Oncology

## 2021-09-26 ENCOUNTER — Inpatient Hospital Stay: Payer: HMO

## 2021-09-26 ENCOUNTER — Other Ambulatory Visit: Payer: Self-pay | Admitting: Oncology

## 2021-09-26 VITALS — BP 160/71 | HR 73 | Temp 97.7°F | Resp 18 | Ht 64.5 in | Wt 232.3 lb

## 2021-09-26 DIAGNOSIS — D649 Anemia, unspecified: Secondary | ICD-10-CM

## 2021-09-26 DIAGNOSIS — D631 Anemia in chronic kidney disease: Secondary | ICD-10-CM

## 2021-09-26 DIAGNOSIS — N184 Chronic kidney disease, stage 4 (severe): Secondary | ICD-10-CM | POA: Diagnosis not present

## 2021-09-26 LAB — CBC AND DIFFERENTIAL
HCT: 26 — AB (ref 36–46)
Hemoglobin: 8.3 — AB (ref 12.0–16.0)
Neutrophils Absolute: 7.96
Platelets: 243 10*3/uL (ref 150–400)
WBC: 10.9

## 2021-09-26 LAB — CBC: RBC: 3.12 — AB (ref 3.87–5.11)

## 2021-09-26 NOTE — Telephone Encounter (Signed)
Referral order placed in computer ? ?

## 2021-09-26 NOTE — Telephone Encounter (Signed)
-----   Message from Derwood Kaplan, MD sent at 09/26/2021 12:02 PM EDT ----- ?Regarding: referral ?Rec referral to [facility].  Mother with breast cancer twice, & 2 maternal aunts with breast cancer.  I don't  know if Vida Roller had already made any referral? ? ?

## 2021-09-27 LAB — ERYTHROPOIETIN: Erythropoietin: 14.8 m[IU]/mL (ref 2.6–18.5)

## 2021-09-29 ENCOUNTER — Encounter: Payer: Self-pay | Admitting: Oncology

## 2021-09-29 ENCOUNTER — Other Ambulatory Visit: Payer: Self-pay

## 2021-09-29 ENCOUNTER — Inpatient Hospital Stay: Payer: HMO

## 2021-09-29 VITALS — BP 119/64 | HR 74 | Temp 98.1°F | Resp 18 | Ht 64.5 in | Wt 233.0 lb

## 2021-09-29 DIAGNOSIS — N184 Chronic kidney disease, stage 4 (severe): Secondary | ICD-10-CM | POA: Diagnosis not present

## 2021-09-29 DIAGNOSIS — D631 Anemia in chronic kidney disease: Secondary | ICD-10-CM

## 2021-09-29 MED ORDER — EPOETIN ALFA-EPBX 20000 UNIT/ML IJ SOLN
20000.0000 [IU] | Freq: Once | INTRAMUSCULAR | Status: AC
  Administered 2021-09-29: 20000 [IU] via SUBCUTANEOUS
  Filled 2021-09-29: qty 1

## 2021-09-29 NOTE — Patient Instructions (Signed)
Epoetin Alfa injection °What is this medication? °EPOETIN ALFA (e POE e tin AL fa) helps your body make more red blood cells. This medicine is used to treat anemia caused by chronic kidney disease, cancer chemotherapy, or HIV-therapy. It may also be used before surgery if you have anemia. °This medicine may be used for other purposes; ask your health care provider or pharmacist if you have questions. °COMMON BRAND NAME(S): Epogen, Procrit, Retacrit °What should I tell my care team before I take this medication? °They need to know if you have any of these conditions: °cancer °heart disease °high blood pressure °history of blood clots °history of stroke °low levels of folate, iron, or vitamin B12 in the blood °seizures °an unusual or allergic reaction to erythropoietin, albumin, benzyl alcohol, hamster proteins, other medicines, foods, dyes, or preservatives °pregnant or trying to get pregnant °breast-feeding °How should I use this medication? °This medicine is for injection into a vein or under the skin. It is usually given by a health care professional in a hospital or clinic setting. °If you get this medicine at home, you will be taught how to prepare and give this medicine. Use exactly as directed. Take your medicine at regular intervals. Do not take your medicine more often than directed. °It is important that you put your used needles and syringes in a special sharps container. Do not put them in a trash can. If you do not have a sharps container, call your pharmacist or healthcare provider to get one. °A special MedGuide will be given to you by the pharmacist with each prescription and refill. Be sure to read this information carefully each time. °Talk to your pediatrician regarding the use of this medicine in children. While this drug may be prescribed for selected conditions, precautions do apply. °Overdosage: If you think you have taken too much of this medicine contact a poison control center or emergency  room at once. °NOTE: This medicine is only for you. Do not share this medicine with others. °What if I miss a dose? °If you miss a dose, take it as soon as you can. If it is almost time for your next dose, take only that dose. Do not take double or extra doses. °What may interact with this medication? °Interactions have not been studied. °This list may not describe all possible interactions. Give your health care provider a list of all the medicines, herbs, non-prescription drugs, or dietary supplements you use. Also tell them if you smoke, drink alcohol, or use illegal drugs. Some items may interact with your medicine. °What should I watch for while using this medication? °Your condition will be monitored carefully while you are receiving this medicine. °You may need blood work done while you are taking this medicine. °This medicine may cause a decrease in vitamin B6. You should make sure that you get enough vitamin B6 while you are taking this medicine. Discuss the foods you eat and the vitamins you take with your health care professional. °What side effects may I notice from receiving this medication? °Side effects that you should report to your doctor or health care professional as soon as possible: °allergic reactions like skin rash, itching or hives, swelling of the face, lips, or tongue °seizures °signs and symptoms of a blood clot such as breathing problems; changes in vision; chest pain; severe, sudden headache; pain, swelling, warmth in the leg; trouble speaking; sudden numbness or weakness of the face, arm or leg °signs and symptoms of a stroke like   changes in vision; confusion; trouble speaking or understanding; severe headaches; sudden numbness or weakness of the face, arm or leg; trouble walking; dizziness; loss of balance or coordination °Side effects that usually do not require medical attention (report to your doctor or health care professional if they continue or are  bothersome): °chills °cough °dizziness °fever °headaches °joint pain °muscle cramps °muscle pain °nausea, vomiting °pain, redness, or irritation at site where injected °This list may not describe all possible side effects. Call your doctor for medical advice about side effects. You may report side effects to FDA at 1-800-FDA-1088. °Where should I keep my medication? °Keep out of the reach of children. °Store in a refrigerator between 2 and 8 degrees C (36 and 46 degrees F). Do not freeze or shake. Throw away any unused portion if using a single-dose vial. Multi-dose vials can be kept in the refrigerator for up to 21 days after the initial dose. Throw away unused medicine. °NOTE: This sheet is a summary. It may not cover all possible information. If you have questions about this medicine, talk to your doctor, pharmacist, or health care provider. °© 2022 [employer]/Gold Standard (2017-03-05 00:00:00) ° °

## 2021-10-03 DIAGNOSIS — F411 Generalized anxiety disorder: Secondary | ICD-10-CM | POA: Diagnosis not present

## 2021-10-05 ENCOUNTER — Other Ambulatory Visit: Payer: Self-pay | Admitting: Family Medicine

## 2021-10-10 ENCOUNTER — Other Ambulatory Visit: Payer: Self-pay

## 2021-10-10 ENCOUNTER — Encounter: Payer: Self-pay | Admitting: Family Medicine

## 2021-10-10 ENCOUNTER — Ambulatory Visit (INDEPENDENT_AMBULATORY_CARE_PROVIDER_SITE_OTHER): Payer: HMO | Admitting: Family Medicine

## 2021-10-10 VITALS — BP 114/50 | HR 78 | Temp 97.3°F | Resp 18 | Ht 64.0 in | Wt 229.4 lb

## 2021-10-10 DIAGNOSIS — T466X5A Adverse effect of antihyperlipidemic and antiarteriosclerotic drugs, initial encounter: Secondary | ICD-10-CM

## 2021-10-10 DIAGNOSIS — E782 Mixed hyperlipidemia: Secondary | ICD-10-CM | POA: Diagnosis not present

## 2021-10-10 DIAGNOSIS — Z9981 Dependence on supplemental oxygen: Secondary | ICD-10-CM | POA: Diagnosis not present

## 2021-10-10 DIAGNOSIS — N2581 Secondary hyperparathyroidism of renal origin: Secondary | ICD-10-CM | POA: Diagnosis not present

## 2021-10-10 DIAGNOSIS — I130 Hypertensive heart and chronic kidney disease with heart failure and stage 1 through stage 4 chronic kidney disease, or unspecified chronic kidney disease: Secondary | ICD-10-CM | POA: Diagnosis not present

## 2021-10-10 DIAGNOSIS — N1832 Chronic kidney disease, stage 3b: Secondary | ICD-10-CM

## 2021-10-10 DIAGNOSIS — E1121 Type 2 diabetes mellitus with diabetic nephropathy: Secondary | ICD-10-CM | POA: Diagnosis not present

## 2021-10-10 DIAGNOSIS — G720 Drug-induced myopathy: Secondary | ICD-10-CM

## 2021-10-10 DIAGNOSIS — Z8674 Personal history of sudden cardiac arrest: Secondary | ICD-10-CM

## 2021-10-10 DIAGNOSIS — G4733 Obstructive sleep apnea (adult) (pediatric): Secondary | ICD-10-CM | POA: Diagnosis not present

## 2021-10-10 DIAGNOSIS — F411 Generalized anxiety disorder: Secondary | ICD-10-CM

## 2021-10-10 DIAGNOSIS — F330 Major depressive disorder, recurrent, mild: Secondary | ICD-10-CM | POA: Diagnosis not present

## 2021-10-10 DIAGNOSIS — Z9989 Dependence on other enabling machines and devices: Secondary | ICD-10-CM

## 2021-10-10 DIAGNOSIS — Z6839 Body mass index (BMI) 39.0-39.9, adult: Secondary | ICD-10-CM

## 2021-10-10 DIAGNOSIS — I25118 Atherosclerotic heart disease of native coronary artery with other forms of angina pectoris: Secondary | ICD-10-CM

## 2021-10-10 NOTE — Progress Notes (Signed)
? ?Subjective:  ?Patient ID: Dana [name], female    DOB: 12-15-1960  Age: 61 y.o. MRN: [id number] ? ?Chief Complaint  ?Patient presents with  ? Diabetes  ? Anemia  ? ?HPI ?Diabetes:  ?Complications: neuropathy, nephropathy, CKD, retinopathy. ?Glucose checking: CGM ?Glucose logs: AM 80-95, POSTPRANDIAL 160-200.  ?Hypoglycemia: none ?Most recent A1C: 6.9 (08/10/2021) ?Current medications: levimir 50 U daily and novolog SSI ?Foot checks: daily ? ?Hyperlipidemia: ?Current medications: Repatha and zetia.  ? ?Hypertension: ?Complications:CORONARY ARTERY DISEASE, CONGESTIVE HEART FAILURE. ?Current medications:hydralazine, torasemide, carvedilol, Imdur, aspirin, also on plavix.  ? ?Cardiovascular: CONGESTIVE HEART FAILURE, CORONARY ARTERY DISEASE. MVP CLIP implantation which changed her from severe MVP to moderate MVP. Marland Kitchen HISTORY of cardiac rest.  ? ?OSA ON CPAP: Patient has seen Dr. Varney Baas.  ? ?Depression: well controlled on buspar, lorazepam,  ?Chronic pain syndrome: Back pain. On hydrocodone 7.5/325 mg three times a day as needed. Usually takes twice daily. Only takes if 7/10. Does not take if if going to drive.  ? ?Diet: Healthy ?Exercise: was in cardiac rehab due to anemia. Intends to return after hb improves with procrit. Doing some chair yoga.  ? ?Patient Care Team: ?Kennith Gain, MD as Consulting Physician (Allergy) ?Bensimhon, Shaune Pascal, MD as Consulting Physician (Cardiology) ?Awanda Mink, MD as Consulting Physician (Ophthalmology) ?Katheran James., MD as Consulting Physician (Endocrinology) ?Sharyn Dross., DPM as Consulting Physician (Podiatry) ?Hennie Duos, MD as Consulting Physician (Rheumatology) ?Early Osmond, MD as Consulting Physician (Cardiology) ?Derwood Kaplan, MD as Consulting Physician (Oncology)  ?Biagio Quint - nephrology ?Francena Hanly, MD - pulmonology.  ?Giarmo - psychologist.  ? ? ? ? ? ? ?Current Outpatient Medications on File Prior to Visit   ?Medication Sig Dispense Refill  ? allopurinol (ZYLOPRIM) 100 MG tablet Take 1 tablet by mouth daily.    ? amoxicillin (AMOXIL) 500 MG capsule TAKE FOUR CAPSULES BY MOUTH ONE HOUR BEFORE DENTAL PROCEDURES AND CLEANINGS    ? Aspirin (VAZALORE) 81 MG CAPS Take 1 tablet by mouth daily.    ? carvedilol (COREG) 12.5 MG tablet Take 1 tablet (12.5 mg total) by mouth 2 (two) times daily. 60 tablet 11  ? Cholecalciferol (VITAMIN D) 50 MCG (2000 UT) tablet Take 2,000 Units by mouth daily. Take one daily    ? clopidogrel (PLAVIX) 75 MG tablet Take 1 tablet by mouth daily.    ? Cyanocobalamin 3000 MCG CAPS Take 2 capsules by mouth daily. Patient purchased 3000 mcg gummies and taking 2 gummies daily    ? EPINEPHrine 0.3 mg/0.3 mL IJ SOAJ injection Use as directed for life-threatening allergic reaction. 4 Device 3  ? Evolocumab (REPATHA SURECLICK) 665 MG/ML SOAJ Inject 1 Dose into the skin every 14 (fourteen) days. 6 mL 31  ? ferrous sulfate 325 (65 FE) MG tablet Take 1 tablet by mouth daily.    ? hydrALAZINE (APRESOLINE) 25 MG tablet Take 25 mg by mouth 3 (three) times daily.    ? insulin aspart (NOVOLOG FLEXPEN) 100 UNIT/ML FlexPen 2-10 U before meals based on SSI. 15 mL 3  ? Insulin Pen Needle (BD PEN NEEDLE NANO 2ND GEN) 32G X 4 MM MISC Inject 1 each into the skin in the morning, at noon, in the evening, and at bedtime. 600 each 3  ? isosorbide mononitrate (IMDUR) 30 MG 24 hr tablet Take 1 tablet by mouth daily.    ? lactulose (CHRONULAC) 10 GM/15ML solution Take 15 mLs (10 g total) by mouth daily as  needed for mild constipation. 236 mL 1  ? latanoprost (XALATAN) 0.005 % ophthalmic solution Place 1 drop into both eyes at bedtime.    ? magnesium oxide (MAG-OX) 400 MG tablet Take 2 tablets (800 mg total) by mouth daily. Take 2 (400 mg) daily=800 mg 60 tablet 1  ? nitroGLYCERIN (NITROSTAT) 0.4 MG SL tablet Place 1 tablet (0.4 mg total) under the tongue every 5 (five) minutes as needed for chest pain. 25 tablet 3  ? Omega-3  Fatty Acids (FISH OIL PO) Take 1 capsule by mouth daily.    ? OXYGEN Inhale 2 L into the lungs at bedtime.    ? pantoprazole (PROTONIX) 40 MG tablet Take 1 tablet (40 mg total) by mouth daily. 90 tablet 1  ? potassium chloride SA (KLOR-CON M) 20 MEQ tablet Take 1 tablet by mouth daily.    ? Probiotic CAPS Take 1 capsule by mouth daily.    ? torsemide (DEMADEX) 20 MG tablet Take 4 tablets (80 mg total) by mouth daily. 130 tablet 5  ? ?No current facility-administered medications on file prior to visit.  ? ?Past Medical History:  ?Diagnosis Date  ? Acquired absence of left great toe ([place])   ? Acquired absence of other left toe(s) ([place])   ? ACS (acute coronary syndrome) ([place])   ? Anemia   ? Anoxic brain injury ([place])   ? Anxiety   ? CAD (coronary artery disease)   ? DES mLAD 04/07/15; DES dRCA & DES pPDA 08/30/16; DES mCX & PTCA OM1 09/29/20  ? Chronic diastolic (congestive) heart failure (HCC)   ? Chronic pain   ? Chronic systolic (congestive) heart failure (HCC)   ? CKD (chronic kidney disease)   ? Contrast dye induced nephropathy, possible 09/03/2020  ? COPD (chronic obstructive pulmonary disease) ([place])   ? Diabetes ([place])   ? Diastolic CHF ([place])   ? Dyspnea   ? Family history of breast cancer 10/23/2021  ? Gastro-esophageal reflux disease without esophagitis   ? Hyperlipidemia   ? Hypertension   ? Hypertensive heart disease with heart failure ([place])   ? Hypoxemia   ? Idiopathic gout, multiple sites   ? Localized edema   ? Low back pain   ? Mitral regurgitation   ? Mitral regurgitation   ? Mixed hyperlipidemia   ? Mixed incontinence   ? Morbid (severe) obesity due to excess calories ([place])   ? Neuromuscular disorder ([place])   ? Neuropathy   ? Non-ST elevation (NSTEMI) myocardial infarction [facility])   ? 08/29/20, 09/29/20  ? OSA (obstructive sleep apnea) 09/03/2020  ? Other specified hypothyroidism   ? PAF (paroxysmal atrial fibrillation) (HCC)   ? s/p pulmonary vein isolation by cryoablation 10/04/15 Dr. Minna Merritts in [facility]  ? PEA  (Pulseless electrical activity) ([place])   ? ~ 01/13/21 at [facility] during admission DKA, volume overload, possible CAP, hypoxia s/p ACLS with ROSC ~ 10-15  ? Pneumonia   ? Primary insomnia   ? Rheumatoid arthritis ([place])   ? Stroke [facility])   ? Thyroid disease   ? Type 2 diabetes mellitus with diabetic nephropathy ([place])   ? Vitamin D deficiency   ? ?Past Surgical History:  ?Procedure Laterality Date  ? ABDOMINAL HYSTERECTOMY    ? Partial- Still has both ovaries  ? CARDIOVASCULAR STRESS TEST  08/13/2014  ? Nuclear; Normal  ? CARPAL TUNNEL RELEASE    ? CATARACT EXTRACTION, BILATERAL    ? CESAREAN SECTION    ? CHOLECYSTECTOMY    ?  CORONARY ANGIOPLASTY WITH STENT PLACEMENT    ? 3 blockages/ 1 stent  ? CORONARY STENT INTERVENTION N/A 09/29/2020  ? Procedure: CORONARY STENT INTERVENTION;  Surgeon: Sherren Mocha, MD;  Location: [place] CV LAB;  Service: Cardiovascular;  Laterality: N/A;  CFX  ? EYE SURGERY    ? LEFT HEART CATH AND CORONARY ANGIOGRAPHY N/A 09/29/2020  ? Procedure: LEFT HEART CATH AND CORONARY ANGIOGRAPHY;  Surgeon: Sherren Mocha, MD;  Location: [place] CV LAB;  Service: Cardiovascular;  Laterality: N/A;  ? MITRAL VALVE REPAIR N/A 05/18/2021  ? Procedure: MITRAL VALVE REPAIR;  Surgeon: Early Osmond, MD;  Location: [place] CV LAB;  Service: Cardiovascular;  Laterality: N/A;  ? RIGHT HEART CATH N/A 04/27/2021  ? Procedure: RIGHT HEART CATH;  Surgeon: Jolaine Artist, MD;  Location: [place] CV LAB;  Service: Cardiovascular;  Laterality: N/A;  ? RIGHT/LEFT HEART CATH AND CORONARY ANGIOGRAPHY N/A 01/07/2017  ? Procedure: Right/Left Heart Cath and Coronary Angiography;  Surgeon: Larey Dresser, MD;  Location: [place] CV LAB;  Service: Cardiovascular;  Laterality: N/A;  ? ROTATOR CUFF REPAIR    ? TEE WITHOUT CARDIOVERSION N/A 04/28/2021  ? Procedure: TRANSESOPHAGEAL ECHOCARDIOGRAM (TEE);  Surgeon: Jolaine Artist, MD;  Location: [facility] ENDOSCOPY;  Service: Cardiovascular;  Laterality:  N/A;  ? TEE WITHOUT CARDIOVERSION N/A 05/18/2021  ? Procedure: TRANSESOPHAGEAL ECHOCARDIOGRAM (TEE);  Surgeon: Early Osmond, MD;  Location: [place] CV LAB;  Service: Cardiovascular;  Laterality: N/A;  ? TO

## 2021-10-11 ENCOUNTER — Other Ambulatory Visit: Payer: Self-pay | Admitting: Internal Medicine

## 2021-10-11 LAB — CBC WITH DIFFERENTIAL/PLATELET
Basophils Absolute: 0.1 10*3/uL (ref 0.0–0.2)
Basos: 1 %
EOS (ABSOLUTE): 0.3 10*3/uL (ref 0.0–0.4)
Eos: 3 %
Hematocrit: 26.2 % — ABNORMAL LOW (ref 34.0–46.6)
Hemoglobin: 8.3 g/dL — ABNORMAL LOW (ref 11.1–15.9)
Immature Grans (Abs): 0 10*3/uL (ref 0.0–0.1)
Immature Granulocytes: 0 %
Lymphocytes Absolute: 2 10*3/uL (ref 0.7–3.1)
Lymphs: 20 %
MCH: 26.4 pg — ABNORMAL LOW (ref 26.6–33.0)
MCHC: 31.7 g/dL (ref 31.5–35.7)
MCV: 83 fL (ref 79–97)
Monocytes Absolute: 0.8 10*3/uL (ref 0.1–0.9)
Monocytes: 8 %
Neutrophils Absolute: 6.9 10*3/uL (ref 1.4–7.0)
Neutrophils: 68 %
Platelets: 265 10*3/uL (ref 150–450)
RBC: 3.14 x10E6/uL — ABNORMAL LOW (ref 3.77–5.28)
RDW: 14.9 % (ref 11.7–15.4)
WBC: 10.2 10*3/uL (ref 3.4–10.8)

## 2021-10-11 LAB — COMPREHENSIVE METABOLIC PANEL
ALT: 17 IU/L (ref 0–32)
AST: 18 IU/L (ref 0–40)
Albumin/Globulin Ratio: 1.3 (ref 1.2–2.2)
Albumin: 4 g/dL (ref 3.8–4.9)
Alkaline Phosphatase: 149 IU/L — ABNORMAL HIGH (ref 44–121)
BUN/Creatinine Ratio: 25 — ABNORMAL HIGH (ref 9–23)
BUN: 62 mg/dL — ABNORMAL HIGH (ref 6–24)
Bilirubin Total: 0.5 mg/dL (ref 0.0–1.2)
CO2: 25 mmol/L (ref 20–29)
Calcium: 8.8 mg/dL (ref 8.7–10.2)
Chloride: 98 mmol/L (ref 96–106)
Creatinine, Ser: 2.46 mg/dL — ABNORMAL HIGH (ref 0.57–1.00)
Globulin, Total: 3.2 g/dL (ref 1.5–4.5)
Glucose: 99 mg/dL (ref 70–99)
Potassium: 4.4 mmol/L (ref 3.5–5.2)
Sodium: 139 mmol/L (ref 134–144)
Total Protein: 7.2 g/dL (ref 6.0–8.5)
eGFR: 22 mL/min/{1.73_m2} — ABNORMAL LOW (ref >59)

## 2021-10-11 LAB — LIPID PANEL
Chol/HDL Ratio: 2.4 ratio (ref 0.0–4.4)
Cholesterol, Total: 120 mg/dL (ref 100–199)
HDL: 49 mg/dL (ref >39)
LDL Chol Calc (NIH): 56 mg/dL (ref 0–99)
Triglycerides: 76 mg/dL (ref 0–149)
VLDL Cholesterol Cal: 15 mg/dL (ref 5–40)

## 2021-10-11 NOTE — Progress Notes (Signed)
Blood count unchanged. Continue procrit shots.  ?Liver function normal.  ?Kidney function abnormal. Fairly stable.  ?Cholesterol: good ? ?

## 2021-10-12 ENCOUNTER — Telehealth: Payer: Self-pay

## 2021-10-12 NOTE — Progress Notes (Signed)
? ? ?Chronic Care Management ?Pharmacy Assistant  ? ?Name: Dana [name]  MRN: [id number] DOB: 1961-02-26 ? ? ?Reason for Encounter: Medication Coordination for [facility]  ?  ?Recent office visits:  ?10/10/21 [name] MD. Seen for Routine Visit. No med changes. ? ?Recent consult visits:  ?None ? ?Hospital visits:  ?None ? ?Medications: ?Outpatient Encounter Medications as of 10/12/2021  ?Medication Sig  ? allopurinol (ZYLOPRIM) 100 MG tablet Take 1 tablet by mouth daily.  ? amoxicillin (AMOXIL) 500 MG capsule TAKE FOUR CAPSULES BY MOUTH ONE HOUR BEFORE DENTAL PROCEDURES AND CLEANINGS  ? Aspirin (VAZALORE) 81 MG CAPS Take 1 tablet by mouth daily.  ? busPIRone (BUSPAR) 5 MG tablet TAKE ONE TABLET BY MOUTH THREE TIMES DAILY  ? carvedilol (COREG) 12.5 MG tablet Take 1 tablet (12.5 mg total) by mouth 2 (two) times daily.  ? Cholecalciferol (VITAMIN D) 50 MCG (2000 UT) tablet Take 2,000 Units by mouth daily. Take one daily  ? clopidogrel (PLAVIX) 75 MG tablet Take 1 tablet by mouth daily.  ? Cyanocobalamin 3000 MCG CAPS Take 2 capsules by mouth daily. Patient purchased 3000 mcg gummies and taking 2 gummies daily  ? EPINEPHrine 0.3 mg/0.3 mL IJ SOAJ injection Use as directed for life-threatening allergic reaction.  ? Evolocumab (REPATHA SURECLICK) 834 MG/ML SOAJ Inject 1 Dose into the skin every 14 (fourteen) days.  ? ezetimibe (ZETIA) 10 MG tablet Take 1 tablet (10 mg total) by mouth daily.  ? ferrous sulfate 325 (65 FE) MG tablet Take 1 tablet by mouth daily.  ? hydrALAZINE (APRESOLINE) 25 MG tablet Take 25 mg by mouth 3 (three) times daily.  ? HYDROcodone-acetaminophen (NORCO) 7.5-325 MG tablet TAKE ONE TABLET BY MOUTH three times daily AS NEEDED FOR moderate pain  ? insulin aspart (NOVOLOG FLEXPEN) 100 UNIT/ML FlexPen 2-10 U before meals based on SSI.  ? Insulin Pen Needle (BD PEN NEEDLE NANO 2ND GEN) 32G X 4 MM MISC Inject 1 each into the skin in the morning, at noon, in the evening, and at bedtime.  ? isosorbide  mononitrate (IMDUR) 30 MG 24 hr tablet Take 1 tablet by mouth daily.  ? lactulose (CHRONULAC) 10 GM/15ML solution Take 15 mLs (10 g total) by mouth daily as needed for mild constipation.  ? latanoprost (XALATAN) 0.005 % ophthalmic solution Place 1 drop into both eyes at bedtime.  ? LEVEMIR FLEXTOUCH 100 UNIT/ML FlexTouch Pen Inject 50 Units into the skin daily.  ? LORazepam (ATIVAN) 0.5 MG tablet Take 1 tablet (0.5 mg total) by mouth daily as needed for anxiety.  ? magnesium oxide (MAG-OX) 400 MG tablet Take 2 tablets (800 mg total) by mouth daily. Take 2 (400 mg) daily=800 mg  ? nitroGLYCERIN (NITROSTAT) 0.4 MG SL tablet Place 1 tablet (0.4 mg total) under the tongue every 5 (five) minutes as needed for chest pain.  ? Omega-3 Fatty Acids (FISH OIL PO) Take 1 capsule by mouth daily.  ? OXYGEN Inhale 2 L into the lungs at bedtime.  ? pantoprazole (PROTONIX) 40 MG tablet Take 1 tablet (40 mg total) by mouth daily.  ? potassium chloride SA (KLOR-CON M) 20 MEQ tablet Take 1 tablet by mouth daily.  ? Probiotic CAPS Take 1 capsule by mouth daily.  ? promethazine (PHENERGAN) 25 MG tablet Take by mouth as needed.  ? torsemide (DEMADEX) 20 MG tablet Take 4 tablets (80 mg total) by mouth daily.  ? ?No facility-administered encounter medications on file as of 10/12/2021.  ? ? ?Reviewed chart for  medication changes ahead of medication coordination call. ? ?No Consults, or hospital visits since last care coordination call/Pharmacist visit.  ? ?No medication changes indicated OR if recent visit, treatment plan here. ? ?BP Readings from Last 3 Encounters:  ?10/10/21 (!) 114/50  ?09/29/21 119/64  ?09/26/21 (!) 160/71  ?  ?Lab Results  ?Component Value Date  ? HGBA1C 6.6 (H) 04/25/2021  ?  ? ?Patient obtains medications through Vials  90 Days  ? ?Last adherence delivery included:  ?hydrocodone/APAP 7.5-'325mg'$  1 tab three times daily as needed    ? ?Patient declined (meds) last delivery  ?None ? ?Patient is due for next adherence  delivery on: 10/24/21. ?Called patient and reviewed medications and coordinated delivery. ? ?This delivery to include: ?Allopurinol '100mg'$  1 tab daily ?Aspirin '81mg'$  1 tab daily ?Novolog Flexpen Inject 2-10 units before meals ?Buspirone '5mg'$ - 1 tab three times daily ?Carvedilol 12.'5mg'$ - 1 tab twice daily  ?Clopidogrel '75mg'$ - 1 tab at bedtime ?Ezetimibe '10mg'$ - 1 tab once daily ?Hydralazine '25mg'$ - 1 tab three times daily  ?Isosorbide Mono '30mg'$ - 1 tab once daily  ?Pantoprazole '40mg'$ - 1 tab once daily  ?Potassium Chloride Er 48mq- 1 tab once daily  ?Torsemide '20mg'$ - 4 tabs once daily  ?Freestyle Libre 2 Sensors- Change Sensor every 14 days. Needs a 90ds  ?Repatha '140mg'$ - Inject '140mg'$  every 14 days ? ?Patient declined the following medications  ?Lorazepam not due until 4/17 (cannot be billed until this day). Hydrocodone, Levemir not due until 4/20 (hydrocodone cannot be billed until this day).  ?Pen needles not due until 12/03/21.  ? ?Patient needs refills  ?Buspirone '5mg'$ - 1 tab three times daily ?Freestyle Libre 2 Sensors- Change Sensor every 14 days. Needs a 90ds  ?Repatha '140mg'$ - Inject '140mg'$  every 14 days. Needs 90ds ? ?Acute fills ?Lorazepam 0.'5mg'$  not due until 4/17  ?Hydrocodone 7.5-'325mg'$   and Levemir 100units not due until 4/20  ?Pen needles not due until 12/03/21.  ? ?Confirmed delivery date of 10/24/21, advised patient that pharmacy will contact them the morning of delivery. ? ? ?DElray Mcgregor CMA ?Clinical Pharmacist Assistant  ?[phone] ?

## 2021-10-13 ENCOUNTER — Other Ambulatory Visit: Payer: Self-pay

## 2021-10-13 DIAGNOSIS — F411 Generalized anxiety disorder: Secondary | ICD-10-CM

## 2021-10-17 DIAGNOSIS — Z89412 Acquired absence of left great toe: Secondary | ICD-10-CM | POA: Diagnosis not present

## 2021-10-17 DIAGNOSIS — D649 Anemia, unspecified: Secondary | ICD-10-CM | POA: Diagnosis not present

## 2021-10-17 DIAGNOSIS — F411 Generalized anxiety disorder: Secondary | ICD-10-CM | POA: Diagnosis not present

## 2021-10-17 DIAGNOSIS — I509 Heart failure, unspecified: Secondary | ICD-10-CM | POA: Diagnosis not present

## 2021-10-17 DIAGNOSIS — Z89422 Acquired absence of other left toe(s): Secondary | ICD-10-CM | POA: Diagnosis not present

## 2021-10-17 DIAGNOSIS — E1142 Type 2 diabetes mellitus with diabetic polyneuropathy: Secondary | ICD-10-CM | POA: Diagnosis not present

## 2021-10-17 DIAGNOSIS — Z9889 Other specified postprocedural states: Secondary | ICD-10-CM | POA: Diagnosis not present

## 2021-10-17 DIAGNOSIS — I110 Hypertensive heart disease with heart failure: Secondary | ICD-10-CM | POA: Diagnosis not present

## 2021-10-17 DIAGNOSIS — Z794 Long term (current) use of insulin: Secondary | ICD-10-CM | POA: Diagnosis not present

## 2021-10-17 DIAGNOSIS — Z6841 Body Mass Index (BMI) 40.0 and over, adult: Secondary | ICD-10-CM | POA: Diagnosis not present

## 2021-10-19 ENCOUNTER — Other Ambulatory Visit: Payer: Self-pay | Admitting: Family Medicine

## 2021-10-19 DIAGNOSIS — F411 Generalized anxiety disorder: Secondary | ICD-10-CM

## 2021-10-20 ENCOUNTER — Inpatient Hospital Stay: Payer: HMO

## 2021-10-20 ENCOUNTER — Other Ambulatory Visit: Payer: Self-pay | Admitting: Genetic Counselor

## 2021-10-20 ENCOUNTER — Inpatient Hospital Stay: Payer: HMO | Attending: Hematology and Oncology | Admitting: Genetic Counselor

## 2021-10-20 DIAGNOSIS — Z803 Family history of malignant neoplasm of breast: Secondary | ICD-10-CM

## 2021-10-20 DIAGNOSIS — N184 Chronic kidney disease, stage 4 (severe): Secondary | ICD-10-CM | POA: Insufficient documentation

## 2021-10-20 DIAGNOSIS — D631 Anemia in chronic kidney disease: Secondary | ICD-10-CM | POA: Insufficient documentation

## 2021-10-22 ENCOUNTER — Other Ambulatory Visit: Payer: Self-pay | Admitting: Family Medicine

## 2021-10-23 ENCOUNTER — Encounter: Payer: Self-pay | Admitting: Genetic Counselor

## 2021-10-23 ENCOUNTER — Encounter: Payer: Self-pay | Admitting: Oncology

## 2021-10-23 DIAGNOSIS — Z803 Family history of malignant neoplasm of breast: Secondary | ICD-10-CM

## 2021-10-23 HISTORY — DX: Family history of malignant neoplasm of breast: Z80.3

## 2021-10-23 NOTE — Progress Notes (Signed)
REFERRING PROVIDER: ?Derwood Kaplan, MD ?[address] ?[place],  [place] [address] ? ?PRIMARY PROVIDER:  ?Dana Brome, MD ? ?PRIMARY REASON FOR VISIT:  ?1. Family history of breast cancer   ? ? ?HISTORY OF PRESENT ILLNESS:   ?Dana [name], a 61 y.o. female, was seen for a [facility] cancer genetics consultation at the request of Dr. Hinton [name] due to a family history of breast cancer.  Dana [name] presents to clinic today to discuss the possibility of a hereditary predisposition to cancer, to discuss genetic testing, and to further clarify her future cancer risks, as well as potential cancer risks for family members.  ? ?Dana [name] is a 61 y.o. female with no personal history of cancer.   ? ?CANCER HISTORY:  ?Oncology History  ? No history exists.  ? ? ?RISK FACTORS:  ?Mammogram within the last year: no ?Number of breast biopsies: 0. ?Colonoscopy: yes;  most recent in 2017 . ?Hysterectomy: yes.  ?Ovaries intact: yes.  ?Menarche was at age 17 or 34.  ?First live birth at age 74.  ?OCP use for approximately 10 years.  ?HRT use: 0 years. ? ? ?Past Medical History:  ?Diagnosis Date  ? Acquired absence of left great toe ([place])   ? Acquired absence of other left toe(s) ([place])   ? ACS (acute coronary syndrome) ([place])   ? Anemia   ? Anoxic brain injury ([place])   ? Anxiety   ? CAD (coronary artery disease)   ? DES mLAD 04/07/15; DES dRCA & DES pPDA 08/30/16; DES mCX & PTCA OM1 09/29/20  ? Chronic diastolic (congestive) heart failure (HCC)   ? Chronic pain   ? Chronic systolic (congestive) heart failure (HCC)   ? CKD (chronic kidney disease)   ? Contrast dye induced nephropathy, possible 09/03/2020  ? COPD (chronic obstructive pulmonary disease) ([place])   ? Diabetes ([place])   ? Diastolic CHF ([place])   ? Dyspnea   ? Family history of breast cancer 10/23/2021  ? Gastro-esophageal reflux disease without esophagitis   ? Hyperlipidemia   ? Hypertension   ? Hypertensive heart disease with heart failure ([place])   ? Hypoxemia   ? Idiopathic gout,  multiple sites   ? Localized edema   ? Low back pain   ? Mitral regurgitation   ? Mitral regurgitation   ? Mixed hyperlipidemia   ? Mixed incontinence   ? Morbid (severe) obesity due to excess calories ([place])   ? Neuromuscular disorder ([place])   ? Neuropathy   ? Non-ST elevation (NSTEMI) myocardial infarction [facility])   ? 08/29/20, 09/29/20  ? OSA (obstructive sleep apnea) 09/03/2020  ? Other specified hypothyroidism   ? PAF (paroxysmal atrial fibrillation) (HCC)   ? s/p pulmonary vein isolation by cryoablation 10/04/15 Dr. Minna Merritts in [facility]  ? PEA (Pulseless electrical activity) ([place])   ? ~ 01/13/21 at [facility] during admission DKA, volume overload, possible CAP, hypoxia s/p ACLS with ROSC ~ 10-15  ? Pneumonia   ? Primary insomnia   ? Rheumatoid arthritis ([place])   ? Stroke [facility])   ? Thyroid disease   ? Type 2 diabetes mellitus with diabetic nephropathy ([place])   ? Vitamin D deficiency   ? ? ?Past Surgical History:  ?Procedure Laterality Date  ? ABDOMINAL HYSTERECTOMY    ? Partial- Still has both ovaries  ? CARDIOVASCULAR STRESS TEST  08/13/2014  ? Nuclear; Normal  ? CARPAL TUNNEL RELEASE    ? CATARACT EXTRACTION, BILATERAL    ?  CESAREAN SECTION    ? CHOLECYSTECTOMY    ? CORONARY ANGIOPLASTY WITH STENT PLACEMENT    ? 3 blockages/ 1 stent  ? CORONARY STENT INTERVENTION N/A 09/29/2020  ? Procedure: CORONARY STENT INTERVENTION;  Surgeon: Sherren Mocha, MD;  Location: [place] CV LAB;  Service: Cardiovascular;  Laterality: N/A;  CFX  ? EYE SURGERY    ? LEFT HEART CATH AND CORONARY ANGIOGRAPHY N/A 09/29/2020  ? Procedure: LEFT HEART CATH AND CORONARY ANGIOGRAPHY;  Surgeon: Sherren Mocha, MD;  Location: [place] CV LAB;  Service: Cardiovascular;  Laterality: N/A;  ? MITRAL VALVE REPAIR N/A 05/18/2021  ? Procedure: MITRAL VALVE REPAIR;  Surgeon: Early Osmond, MD;  Location: [place] CV LAB;  Service: Cardiovascular;  Laterality: N/A;  ? RIGHT HEART CATH N/A 04/27/2021  ? Procedure: RIGHT HEART CATH;  Surgeon:  Jolaine Artist, MD;  Location: [place] CV LAB;  Service: Cardiovascular;  Laterality: N/A;  ? RIGHT/LEFT HEART CATH AND CORONARY ANGIOGRAPHY N/A 01/07/2017  ? Procedure: Right/Left Heart Cath and Coronary Angiography;  Surgeon: Larey Dresser, MD;  Location: [place] CV LAB;  Service: Cardiovascular;  Laterality: N/A;  ? ROTATOR CUFF REPAIR    ? TEE WITHOUT CARDIOVERSION N/A 04/28/2021  ? Procedure: TRANSESOPHAGEAL ECHOCARDIOGRAM (TEE);  Surgeon: Jolaine Artist, MD;  Location: [facility] ENDOSCOPY;  Service: Cardiovascular;  Laterality: N/A;  ? TEE WITHOUT CARDIOVERSION N/A 05/18/2021  ? Procedure: TRANSESOPHAGEAL ECHOCARDIOGRAM (TEE);  Surgeon: Early Osmond, MD;  Location: [place] CV LAB;  Service: Cardiovascular;  Laterality: N/A;  ? TOE AMPUTATION Left   ? US ECHOCARDIOGRAPHY  05/2017  ? Normal  ? ? ? ?FAMILY HISTORY:  ?We obtained a detailed, 4-generation family history.  Significant diagnoses are listed below: ?Family History  ?Problem Relation Age of Onset  ? Breast cancer Mother 68  ?     contralateral breast ca dx 27  ? Cancer Other   ?     MGF's sisters x2; unknown type  ? ? ? ?Dana [name] is unaware of previous family history of genetic testing for hereditary cancer risks. There is no reported Ashkenazi Jewish ancestry. There is no known consanguinity. ? ?GENETIC COUNSELING ASSESSMENT: Dana [name] is a 61 y.o. female with a family history of cancer which is somewhat suggestive of a hereditary cancer syndrome and predisposition to cancer given her mother's history of contralateral breast cancer. We, therefore, discussed and recommended the following at today's visit.  ? ?DISCUSSION: We discussed that 5 - 10% of cancer is hereditary, with most cases of hereditary breast cancer associated with mutations in BRCA1/2.  There are other genes that can be associated with hereditary breast cancer syndromes.  We discussed that testing is beneficial for several reasons, including knowing about other  cancer risks, identifying potential screening and risk-reduction options that may be appropriate, and to understanding if other family members could be at risk for cancer and allowing them to undergo genetic testing. ? ?We reviewed the characteristics, features and inheritance patterns of hereditary cancer syndromes. We also discussed genetic testing, including the appropriate family members to test, the process of testing, insurance coverage and turn-around-time for results. We discussed the implications of a negative, positive, carrier and/or variant of uncertain significant result. We discussed that negative results would be uninformative given that Dana [name] does not have a personal history of cancer. We recommended Dana [name] pursue genetic testing for a panel that contains genes associated with breast cancer. ? ?Dana [name] was offered a common  hereditary cancer panel (47 genes) and an expanded pan-cancer panel (77 genes). Dana [name] was informed of the benefits and limitations of each panel, including that expanded pan-cancer panels contain several genes that do not have clear management guidelines at this point in time.  We also discussed that as the number of genes included on a panel increases, the chances of variants of uncertain significance increases.  After considering the benefits and limitations of each gene panel, Dana [name] elected to have a common hereditary cancers panel through [facility]. ? ?The CustomNext-Cancer+RNAinsight panel offered by [facility] includes sequencing and rearrangement analysis for the following 47 genes:  APC, ATM, AXIN2, BARD1, BMPR1A, BRCA1, BRCA2, BRIP1, CDH1, CDK4, CDKN2A, CHEK2, DICER1, EPCAM, GREM1, HOXB13, MEN1, MLH1, MSH2, MSH3, MSH6, MUTYH, NBN, NF1, NF2, NTHL1, PALB2, PMS2, POLD1, POLE, PTEN, RAD51C, RAD51D, RECQL, RET, SDHA, SDHAF2, SDHB, SDHC, SDHD, SMAD4, SMARCA4, STK11, TP53, TSC1, TSC2, and VHL.  RNA data is routinely analyzed for use in variant interpretation  for all genes.'  ? ?Based on Dana [name]'s family history of cancer, she meets medical criteria for genetic testing. Despite that she meets criteria, she may still have an out of pocket cost. We discussed t

## 2021-10-24 ENCOUNTER — Other Ambulatory Visit: Payer: Self-pay

## 2021-10-24 ENCOUNTER — Telehealth: Payer: Self-pay | Admitting: Internal Medicine

## 2021-10-24 DIAGNOSIS — N184 Chronic kidney disease, stage 4 (severe): Secondary | ICD-10-CM

## 2021-10-24 NOTE — Telephone Encounter (Signed)
Pt c/o medication issue: ? ?1. Name of Medication: Evolocumab (REPATHA SURECLICK) 366 MG/ML SOAJ ? ?2. How are you currently taking this medication (dosage and times per day)? Inject 1 Dose into the skin every 14 (fourteen) days. ? ?3. Are you having a reaction (difficulty breathing--STAT)? no ? ?4. What is your medication issue? Calling to see if still getting financial assistant with getting medication. And if so is there any update on the billing of it. Please advise ? ?

## 2021-10-24 NOTE — Telephone Encounter (Signed)
Patient had a [employer] grant but it wasn't being used. About to expire 11/21/21. I have also renewed it. ?The new grant is under medicare investigation. I have submitted documentation, but the old one is still active and has money on it ? ?CARD NO for grant expiring 11/21/21. ?[id number] ? ? ?CARD NO for grant expiring 11/22/22 ?[id number] ?  ?CARD STATUS ?Active ?  ?BIN ?[id number] ?  ?PCN ?[id number] ?  ?PC GROUP ?[id number] ?

## 2021-10-25 ENCOUNTER — Telehealth: Payer: Self-pay | Admitting: Internal Medicine

## 2021-10-25 ENCOUNTER — Other Ambulatory Visit: Payer: Self-pay | Admitting: Legal Medicine

## 2021-10-25 ENCOUNTER — Telehealth: Payer: Self-pay

## 2021-10-25 MED ORDER — PROMETHAZINE HCL 25 MG PO TABS: 25.0000 mg | ORAL_TABLET | ORAL | 3 refills | Status: DC | PRN

## 2021-10-25 NOTE — Telephone Encounter (Signed)
Faxed PA for repatha to [facility] at [phone] ?

## 2021-10-25 NOTE — Telephone Encounter (Signed)
Called patient and gave her the healthwell information. ?

## 2021-10-25 NOTE — Telephone Encounter (Signed)
Patient requesting Promethazine refill but out of refills, will ask [facility] to send to [facility] ?

## 2021-10-26 ENCOUNTER — Ambulatory Visit: Payer: HMO

## 2021-10-26 ENCOUNTER — Other Ambulatory Visit: Payer: Self-pay

## 2021-10-26 ENCOUNTER — Inpatient Hospital Stay: Payer: HMO

## 2021-10-26 DIAGNOSIS — D631 Anemia in chronic kidney disease: Secondary | ICD-10-CM | POA: Diagnosis not present

## 2021-10-26 DIAGNOSIS — D649 Anemia, unspecified: Secondary | ICD-10-CM | POA: Diagnosis not present

## 2021-10-26 DIAGNOSIS — N184 Chronic kidney disease, stage 4 (severe): Secondary | ICD-10-CM

## 2021-10-26 LAB — CBC AND DIFFERENTIAL
HCT: 25 — AB (ref 36–46)
Hemoglobin: 7.9 — AB (ref 12.0–16.0)
Neutrophils Absolute: 6.26
Platelets: 258 10*3/uL (ref 150–400)
WBC: 9.2

## 2021-10-26 LAB — CBC: RBC: 3.04 — AB (ref 3.87–5.11)

## 2021-10-27 ENCOUNTER — Inpatient Hospital Stay: Payer: HMO

## 2021-10-27 ENCOUNTER — Other Ambulatory Visit: Payer: Self-pay | Admitting: Legal Medicine

## 2021-10-27 VITALS — BP 130/49 | HR 71 | Temp 97.7°F | Resp 18 | Wt 229.1 lb

## 2021-10-27 DIAGNOSIS — D631 Anemia in chronic kidney disease: Secondary | ICD-10-CM

## 2021-10-27 DIAGNOSIS — N184 Chronic kidney disease, stage 4 (severe): Secondary | ICD-10-CM | POA: Diagnosis not present

## 2021-10-27 LAB — RENAL FUNCTION PANEL
Albumin: 3.8 g/dL (ref 3.8–4.9)
BUN/Creatinine Ratio: 30 — ABNORMAL HIGH (ref 9–23)
BUN: 70 mg/dL — ABNORMAL HIGH (ref 6–24)
CO2: 24 mmol/L (ref 20–29)
Calcium: 9.3 mg/dL (ref 8.7–10.2)
Chloride: 102 mmol/L (ref 96–106)
Creatinine, Ser: 2.34 mg/dL — ABNORMAL HIGH (ref 0.57–1.00)
Glucose: 116 mg/dL — ABNORMAL HIGH (ref 70–99)
Phosphorus: 5.5 mg/dL — ABNORMAL HIGH (ref 3.0–4.3)
Potassium: 4.5 mmol/L (ref 3.5–5.2)
Sodium: 143 mmol/L (ref 134–144)
eGFR: 24 mL/min/{1.73_m2} — ABNORMAL LOW (ref >59)

## 2021-10-27 MED ORDER — EPOETIN ALFA-EPBX 20000 UNIT/ML IJ SOLN
20000.0000 [IU] | Freq: Once | INTRAMUSCULAR | Status: AC
  Administered 2021-10-27: 20000 [IU] via SUBCUTANEOUS
  Filled 2021-10-27: qty 1

## 2021-10-27 MED ORDER — PROMETHAZINE HCL 25 MG PO TABS: 25.0000 mg | ORAL_TABLET | ORAL | 3 refills | Status: DC | PRN

## 2021-10-27 NOTE — Patient Instructions (Signed)
Epoetin Alfa injection ?What is this medication? ?EPOETIN ALFA (e POE e tin AL fa) helps your body make more red blood cells. This medicine is used to treat anemia caused by chronic kidney disease, cancer chemotherapy, or HIV-therapy. It may also be used before surgery if you have anemia. ?This medicine may be used for other purposes; ask your health care provider or pharmacist if you have questions. ?COMMON BRAND NAME(S): Epogen, Procrit, Retacrit ?What should I tell my care team before I take this medication? ?They need to know if you have any of these conditions: ?cancer ?heart disease ?high blood pressure ?history of blood clots ?history of stroke ?low levels of folate, iron, or vitamin B12 in the blood ?seizures ?an unusual or allergic reaction to erythropoietin, albumin, benzyl alcohol, hamster proteins, other medicines, foods, dyes, or preservatives ?pregnant or trying to get pregnant ?breast-feeding ?How should I use this medication? ?This medicine is for injection into a vein or under the skin. It is usually given by a health care professional in a hospital or clinic setting. ?If you get this medicine at home, you will be taught how to prepare and give this medicine. Use exactly as directed. Take your medicine at regular intervals. Do not take your medicine more often than directed. ?It is important that you put your used needles and syringes in a special sharps container. Do not put them in a trash can. If you do not have a sharps container, call your pharmacist or healthcare provider to get one. ?A special MedGuide will be given to you by the pharmacist with each prescription and refill. Be sure to read this information carefully each time. ?Talk to your pediatrician regarding the use of this medicine in children. While this drug may be prescribed for selected conditions, precautions do apply. ?Overdosage: If you think you have taken too much of this medicine contact a poison control center or emergency  room at once. ?NOTE: This medicine is only for you. Do not share this medicine with others. ?What if I miss a dose? ?If you miss a dose, take it as soon as you can. If it is almost time for your next dose, take only that dose. Do not take double or extra doses. ?What may interact with this medication? ?Interactions have not been studied. ?This list may not describe all possible interactions. Give your health care provider a list of all the medicines, herbs, non-prescription drugs, or dietary supplements you use. Also tell them if you smoke, drink alcohol, or use illegal drugs. Some items may interact with your medicine. ?What should I watch for while using this medication? ?Your condition will be monitored carefully while you are receiving this medicine. ?You may need blood work done while you are taking this medicine. ?This medicine may cause a decrease in vitamin B6. You should make sure that you get enough vitamin B6 while you are taking this medicine. Discuss the foods you eat and the vitamins you take with your health care professional. ?What side effects may I notice from receiving this medication? ?Side effects that you should report to your doctor or health care professional as soon as possible: ?allergic reactions like skin rash, itching or hives, swelling of the face, lips, or tongue ?seizures ?signs and symptoms of a blood clot such as breathing problems; changes in vision; chest pain; severe, sudden headache; pain, swelling, warmth in the leg; trouble speaking; sudden numbness or weakness of the face, arm or leg ?signs and symptoms of a stroke like   changes in vision; confusion; trouble speaking or understanding; severe headaches; sudden numbness or weakness of the face, arm or leg; trouble walking; dizziness; loss of balance or coordination ?Side effects that usually do not require medical attention (report to your doctor or health care professional if they continue or are  bothersome): ?chills ?cough ?dizziness ?fever ?headaches ?joint pain ?muscle cramps ?muscle pain ?nausea, vomiting ?pain, redness, or irritation at site where injected ?This list may not describe all possible side effects. Call your doctor for medical advice about side effects. You may report side effects to FDA at 1-800-FDA-1088. ?Where should I keep my medication? ?Keep out of the reach of children. ?Store in a refrigerator between 2 and 8 degrees C (36 and 46 degrees F). Do not freeze or shake. Throw away any unused portion if using a single-dose vial. Multi-dose vials can be kept in the refrigerator for up to 21 days after the initial dose. Throw away unused medicine. ?NOTE: This sheet is a summary. It may not cover all possible information. If you have questions about this medicine, talk to your doctor, pharmacist, or health care provider. ?? 2023 Elsevier/Gold Standard (2017-03-05 00:00:00) ? ?

## 2021-10-30 ENCOUNTER — Other Ambulatory Visit: Payer: Self-pay | Admitting: Oncology

## 2021-10-30 ENCOUNTER — Other Ambulatory Visit: Payer: Self-pay | Admitting: Family Medicine

## 2021-10-30 DIAGNOSIS — D631 Anemia in chronic kidney disease: Secondary | ICD-10-CM

## 2021-10-30 NOTE — Progress Notes (Signed)
?Dana [name]  ?[address] ?[place],  [place]  [address] ?[phone] ? ?Clinic Day:  10/31/21 ? ?Referring physician: [name], MD ? ?eASSESSMENT & PLAN:  ? ?Chronic anemia that has been worsening.  This is anemia of chronic kidney disease/anemia of chronic disease.  Repeat iron studies do not reveal iron deficiency, soluble transferrin receptor is normal. No hemolysis was Serum protein electrophoresis is negative for a monoclonal spike.  I therefore recommended that she resume Retacrit and we started with 20,000 units subcutaneous monthly and monitor from there. Stool Hemoccults were negative. She has not responded so far, with a hemoglobin of 8.4 today.  Her most recent injection was last week.  I will therefore increase the frequency to every 2 weeks. ? ?Strong family history of breast cancer.  I will refer her to the genetic counselor. ? ?I will increase her Retacrit to 20,000 units every 2 weeks to see if she will respond to that.  We will check her monthly with labs and she already has that scheduled. ? ?I provided 20 minutes of face-to-face time during this this encounter and > 50% was spent counseling as documented under my assessment and plan.  ? ? ?Derwood Kaplan, MD ?[facility] ?[address] ?[place] [address] ?Dept: [phone] ?Dept Fax: [phone]  ? ? ?CHIEF COMPLAINT:  ?CC: Iron deficiency ? ?Current Treatment:  Observation ? ? ?HISTORY OF PRESENT ILLNESS:  ?Dana [name] is a 61 y.o. female with  a complicated medical history, including stage IV chronic kidney disease, who is referred in consultation by Dr. Biagio Quint, nephrology, for assessment and management of iron deficiency.  She had worsening anemia, so was given a dose of Retacrit in December.  She then was then found to have a low iron saturation of 11% with a TIBC of 271 and ferritin 106 on February 10th, and gives a  history of allergic reaction to IV iron, so was referred here.  She states she broke out in severe hives after receiving IV iron in the past 1 to 2 years.  She states she also has had blood transfusion on several occasions, most recent being in December 2022.  She has a history of hypoxic brain injury in June 2022, so has difficulty with dates. She reports severe fatigue as well as dyspnea with limited exertion.  She denies overt form of blood loss.  She is on clopidogrel. Review of her records reveal she received 1 unit of packed cells in November 2022 when her hemoglobin was 7.2.  She was given IV Feraheme in February 2022 when admitted for acute on chronic diastolic heart failure.  She reported severe generalized itching after receiving the first dose of Feraheme.  No hives were noted by the examining provider.  The 2nd dose of Feraheme was not given.  She was transfused in February 2022 when her hemoglobin was 7.8 and again in March 2022 when her hemoglobin was 7. ?  ?In 2017, when she reported rectal bleeding, as well as black tarry stools, EGD and colonoscopy were recommended.  EGD in September 2017 revealed a pyloric erosion.  I was unable to access the colonoscopy report from September 2017. She had EGD in September 2014 that was normal.  She was diagnosed with gastroparesis in September 2014.  It was noted in her chart that she had a colonoscopy in April 2014 which was normal, but I could not find  a report.  She was also transfused in April 2013 at [facility] when her hemoglobin was 7.4.  She has had chronic anemia since 2010.  ? ?Her family history is significant in that her mother had breast cancer twice and ended up with bilateral mastectomies.  She also has 2 maternal aunts that had breast cancer.  I will therefore refer her to the genetic counselor for their opinion regarding testing.  She does not know if her mother has been tested. ? ?INTERVAL HISTORY:  ?I have reviewed her chart and materials  related to her extensively and collaborated history with the patient. Summary of oncologic history is as follows: ?Oncology History  ? No history exists.  ?Alycea is here for routine follow up of anemia of chronic disease, associated with chronic kidney disease stage IV.  She has chronic low back pain and a 7 out of 10.  She does describe dyspnea with her congestive heart failure and her torsemide dose is increased to 80 mg daily.  She complains of fatigue.  Her white count is 11.6 and her hemoglobin is 8.4 as compared to 8.3 two weeks ago.  Her MCV remains stable at 83 and iron studies were evaluated in March and were normal.  Her latest renal function last week revealed a BUN of 70 with a creatinine of 2.34.  We will try increasing the frequency of her Retacrit injections to see if she will respond. Her  appetite is good, and she has lost 1 pound since her last visit.  She denies fever, chills or other signs of infection.  She denies nausea, vomiting, bowel issues, or abdominal pain.  She denies sore throat, cough, dyspnea, or chest pain. ? ?HISTORY:  ? ?Past Medical History:  ?Diagnosis Date  ? Acquired absence of left great toe ([place])   ? Acquired absence of other left toe(s) ([place])   ? ACS (acute coronary syndrome) ([place])   ? Anemia   ? Anoxic brain injury ([place])   ? Anxiety   ? CAD (coronary artery disease)   ? DES mLAD 04/07/15; DES dRCA & DES pPDA 08/30/16; DES mCX & PTCA OM1 09/29/20  ? Chronic diastolic (congestive) heart failure (HCC)   ? Chronic pain   ? Chronic systolic (congestive) heart failure (HCC)   ? CKD (chronic kidney disease)   ? Contrast dye induced nephropathy, possible 09/03/2020  ? COPD (chronic obstructive pulmonary disease) ([place])   ? Diabetes ([place])   ? Diastolic CHF ([place])   ? Dyspnea   ? Family history of breast cancer 10/23/2021  ? Gastro-esophageal reflux disease without esophagitis   ? Hyperlipidemia   ? Hypertension   ? Hypertensive heart disease with heart failure ([place])   ? Hypoxemia   ?  Idiopathic gout, multiple sites   ? Localized edema   ? Low back pain   ? Mitral regurgitation   ? Mitral regurgitation   ? Mixed hyperlipidemia   ? Mixed incontinence   ? Morbid (severe) obesity due to excess calories ([place])   ? Neuromuscular disorder ([place])   ? Neuropathy   ? Non-ST elevation (NSTEMI) myocardial infarction [facility])   ? 08/29/20, 09/29/20  ? OSA (obstructive sleep apnea) 09/03/2020  ? Other specified hypothyroidism   ? PAF (paroxysmal atrial fibrillation) (HCC)   ? s/p pulmonary vein isolation by cryoablation 10/04/15 Dr. Minna Merritts in [facility]  ? PEA (Pulseless electrical activity) ([place])   ? ~ 01/13/21 at [facility] during admission DKA, volume overload, possible CAP, hypoxia  s/p ACLS with ROSC ~ 10-15  ? Pneumonia   ? Primary insomnia   ? Rheumatoid arthritis ([place])   ? Stroke [facility])   ? Thyroid disease   ? Type 2 diabetes mellitus with diabetic nephropathy ([place])   ? Vitamin D deficiency   ? ? ?Past Surgical History:  ?Procedure Laterality Date  ? ABDOMINAL HYSTERECTOMY    ? Partial- Still has both ovaries  ? CARDIOVASCULAR STRESS TEST  08/13/2014  ? Nuclear; Normal  ? CARPAL TUNNEL RELEASE    ? CATARACT EXTRACTION, BILATERAL    ? CESAREAN SECTION    ? CHOLECYSTECTOMY    ? CORONARY ANGIOPLASTY WITH STENT PLACEMENT    ? 3 blockages/ 1 stent  ? CORONARY STENT INTERVENTION N/A 09/29/2020  ? Procedure: CORONARY STENT INTERVENTION;  Surgeon: Sherren Mocha, MD;  Location: [place] [facility];  Service: Cardiovascular;  Laterality: N/A;  CFX  ? EYE SURGERY    ? LEFT HEART CATH AND CORONARY ANGIOGRAPHY N/A 09/29/2020  ? Procedure: LEFT HEART CATH AND CORONARY ANGIOGRAPHY;  Surgeon: Sherren Mocha, MD;  Location: [place] [facility];  Service: Cardiovascular;  Laterality: N/A;  ? MITRAL VALVE REPAIR N/A 05/18/2021  ? Procedure: MITRAL VALVE REPAIR;  Surgeon: Early Osmond, MD;  Location: [place] [facility];  Service: Cardiovascular;  Laterality: N/A;  ? RIGHT HEART CATH N/A 04/27/2021  ? Procedure: RIGHT HEART  CATH;  Surgeon: Jolaine Artist, MD;  Location: [place] [facility];  Service: Cardiovascular;  Laterality: N/A;  ? RIGHT/LEFT HEART CATH AND CORONARY ANGIOGRAPHY N/A 01/07/2017  ? Procedure: Right/Left [place]

## 2021-10-30 NOTE — Telephone Encounter (Signed)
Med approved until 10/28/22 ?

## 2021-10-31 ENCOUNTER — Other Ambulatory Visit: Payer: Self-pay | Admitting: Pharmacist

## 2021-10-31 ENCOUNTER — Other Ambulatory Visit: Payer: Self-pay | Admitting: Physician Assistant

## 2021-10-31 ENCOUNTER — Encounter: Payer: Self-pay | Admitting: Oncology

## 2021-10-31 ENCOUNTER — Inpatient Hospital Stay: Payer: HMO | Admitting: Oncology

## 2021-10-31 ENCOUNTER — Inpatient Hospital Stay: Payer: HMO

## 2021-10-31 VITALS — BP 169/72 | HR 67 | Temp 97.6°F | Resp 20 | Ht 64.0 in | Wt 228.2 lb

## 2021-10-31 DIAGNOSIS — N1832 Chronic kidney disease, stage 3b: Secondary | ICD-10-CM | POA: Diagnosis not present

## 2021-10-31 DIAGNOSIS — N189 Chronic kidney disease, unspecified: Secondary | ICD-10-CM

## 2021-10-31 DIAGNOSIS — D631 Anemia in chronic kidney disease: Secondary | ICD-10-CM

## 2021-10-31 DIAGNOSIS — N184 Chronic kidney disease, stage 4 (severe): Secondary | ICD-10-CM | POA: Diagnosis not present

## 2021-10-31 LAB — CBC AND DIFFERENTIAL
HCT: 26 — AB (ref 36–46)
Hemoglobin: 8.4 — AB (ref 12.0–16.0)
Neutrophils Absolute: 8
Platelets: 283 10*3/uL (ref 150–400)
WBC: 11.6

## 2021-10-31 LAB — CBC: RBC: 3.18 — AB (ref 3.87–5.11)

## 2021-11-01 ENCOUNTER — Encounter: Payer: Self-pay | Admitting: Family Medicine

## 2021-11-01 NOTE — Assessment & Plan Note (Signed)
Well controlled.  ?No changes to medicines.  ?Continue to work on eating a healthy diet and exercise.  ?Labs drawn today.  ?

## 2021-11-01 NOTE — Assessment & Plan Note (Signed)
The current medical regimen is effective;  continue present plan and medications. Continue buspar and lorazepam.  ?

## 2021-11-01 NOTE — Assessment & Plan Note (Signed)
>>  ASSESSMENT AND PLAN FOR DIABETIC NEPHROPATHY ASSOCIATED WITH TYPE 2 DIABETES MELLITUS (HCC) WRITTEN ON 11/01/2021  8:54 PM BY COX, KIRSTEN, MD  Control: good Recommend continue CGM Recommend check feet daily. Recommend annual eye exams. Medicines: no changes.  Continue to work on eating a healthy diet and exercise.  Defer to endocrinology for management.

## 2021-11-01 NOTE — Assessment & Plan Note (Signed)
Intolerant to statins. 

## 2021-11-01 NOTE — Assessment & Plan Note (Signed)
The current medical regimen is effective;  continue present plan and medications. ?Follow up with nephrology.  ?Labs done.  ?

## 2021-11-01 NOTE — Assessment & Plan Note (Signed)
The current medical regimen is effective;  continue present plan and medications.  

## 2021-11-01 NOTE — Assessment & Plan Note (Signed)
Follow up nephrology.  ?

## 2021-11-01 NOTE — Assessment & Plan Note (Signed)
On home oxygen.  ?

## 2021-11-01 NOTE — Assessment & Plan Note (Signed)
Continue imdur, aspirin, plavix.  ?

## 2021-11-01 NOTE — Assessment & Plan Note (Signed)
>>  ASSESSMENT AND PLAN FOR CKD STAGE 3B, GFR 30-44 ML/MIN (HCC) WRITTEN ON 11/01/2021  8:52 PM BY COX, KIRSTEN, MD  The current medical regimen is effective;  continue present plan and medications. Follow up with nephrology.  Labs done.

## 2021-11-01 NOTE — Assessment & Plan Note (Signed)
Comorbidities: diabetes, hypertension Recommend continue to work on eating healthy diet and exercise.  

## 2021-11-01 NOTE — Assessment & Plan Note (Signed)
Control: good ?Recommend continue CGM ?Recommend check feet daily. ?Recommend annual eye exams. ?Medicines: no changes.  ?Continue to work on eating a healthy diet and exercise.  ?Defer to endocrinology for management.    ? ?

## 2021-11-01 NOTE — Assessment & Plan Note (Signed)
Continue CPAP.  

## 2021-11-02 NOTE — Progress Notes (Addendum)
?Advanced Heart Failure Clinic Note  ? ?PCP: [name], MD ?PCP-Cardiologist: [name], MD  ? ?Reason for Visit: f/u chronic diastolic HF ? ?HPI: ?Dana [name] is 61 y.o. female with history of diastolic heart failure s/p cardiomems 02/2018, CAD, s/p DES RCA and LAD 2018, DMI, htn, hyperlipidemia, PAD, and OSA on CPAP, PAF s/p ablation, PAD s/p L great toe and 4th toe amputation, stage IIIb CKD. ?  ?Admitted 03/22 with NSTEMI and a/c CHF. LHC 03/22 showed moderate mid-LAD and mid-RCA disease, severe mid-L-Cx stenosis s/p PCI/DES mid Cx and side branch angioplasty of OM1. ?  ?Unfortunately she developed a contrast-induced nephropathy after cath and creatinine increased from 2.2 to a peak of 5.5. Additionally, she developed severe nausea and vomiting, gastroparesis and required IV antiplatelet therapy.  During this time she developed epistaxis and acute blood loss anemia requiring transfusion.  She was switched back to oral DAPT. Diuretics were reintroduced and her creatinine began to improve over the following days. Her leukocytosis persisted but all her infectious workup was negative and this was felt to be reactive.   ? ?Per chart review, she was admitted 6/22 to [facility] for DKA, started on insulin gtt, IVF and diuretics held. Developed pulmonary vascular congestion vs PNA and was started on IV abx, and diuretics were resumed. She suffered a PEA arrest 01/12/21 with ROSC after 10-15 minutes of CPR and was transferred to [facility]; precipitating factor for PEA arrest thought to be respiratory driven. On arrival, she was still in DKA, AKI on CKD with oliguria and Nephrology was consulted for further management. TTE with EF 60%, min MR/TR. Concern for stroke due to left-sided weakness and MRI confirmed anoxic brain injury, but no definite infarct. She was extubated, SCr returned to baseline of 1.6-1.8 and she was discharged to rehab facility.  ? ?Zio placed 8/22 for palpitations, showing mostly SB/SR no afib, 66  SVT runs, frequent isolated SVE (11.2%). ? ?Admitted from AHF clinic 10/22 for A/C CHF after failing outpatient escalation in diuretic therapy. She was enrolled in FASTR trial and diuresed > 15 lb.  Hospitalization complicated by AKI on CKD, Scr up to 3.13. RHC showed mild to moderate elevating filling pressures with large v-waves in PCWP tracing suggesting severe MR vs severe diastolic dysfunction. TEE 55-60% showed severe central MR.  ? ?Patient underwent TEER of mitral valve with placement of 1XT W and 1NT W clip on 05/18/2021. NT W clip was entangled and had to be deployed into lateral part of valve. No additional clip placement could be preformed. Echo 11/04 with EF 50-55% and residual moderate MR. Given 1 dose IV lasix prior to discharge.  ? ?Follow up 1/23 doing CR 3-4x/week. Volume mildly up, torsemide doubled x 2 days.  ? ?Echo 3/23 showed EF 55-60%, RV OK, moderate MR mean gradient 7-8 mmHg. Saw [name] and MR now moderate, no indication for 3rd clip.  ? ?Follow up 09/25/21, volume up. Increased torsemide to 60 bid x 2 days then 80 mg daily. ? ?Today she returns for HF follow up with her husband. Overall feeling fatigued. Tries to get around the house and does light housework without significant SOB. Main issue is dizziness, felt like BP was dropping, but did not check BP or blood sugar. No falls. This has happened on several occasions. Occasional palpitations. Denies CP, abnormal bleeding, edema, or PND/Orthopnea. Appetite ok. No fever or chills. Weight at home 228 pounds. Taking all medications.  ? ?Cardiac Studies: ?- Echo (3/23): EF 55-60%, RV  normal, moderate MR ?- Echo 11/22: EF 50-55%, residual moderate MR ? ?- RHC (10/22):   ?RA = 8 ?RV = 59/9 ?PA = 61/17 (35) ?PCW = mean 23 (v = 49) -> performed multiple wedges ?Fick cardiac output/index = ?PVR = 1.6 WU ?FA sat = 98% ?PA sat = 69%, 70% ?PaPi = 5.5 ?Assessment: ?1. Mild to moderately elevated filling pressures with very large v-waves in PCWP  tracing suggestive of severe diastolic dysfunction vs severe MR ? ?- TEE (10/22): EF 55-60%, basal inferior akinesis, severe central MR. ?- Echo (10/22): EF 65-70%, RV ok, moderate MR ?- Echo (5/21): EF 60-65%, RV ok, Grade II DD. ?- Echo (2/22): EF 55-60%, RV ok, Grade II DD, mod MR ?- Echo (3/22): EF 55-60%, Grade II DD, normal RV ?- Echo (6/22): EF 60%, mod MR/TR (per chart review at [facility]). ?- Zio (8/19): no arrhythmias, occasional PAC/PVCs ?- Zio (9/22): mostly SB/SR no afib, 66 SVT runs, frequent isolated SVE (11.2%). ? ?- LHC (3/22): 3 vessel CAD with moderate mid-LAD and mid-RCA stenoses, and severe mid-LCx stenosis; successful PCI of the mid-circumflex, reducing severe 90% stenosis to 0% with a 2.75x22 mm Resolute Onyx DES, provisional side branch angioplasty of the OM1 with a 2.5 mm balloon through the stent struts ? ?Review of systems complete and found to be negative unless listed in HPI.   ? ?Past Medical History:  ?Diagnosis Date  ? Acquired absence of left great toe ([place])   ? Acquired absence of other left toe(s) ([place])   ? ACS (acute coronary syndrome) ([place])   ? Anemia   ? Anoxic brain injury ([place])   ? Anxiety   ? CAD (coronary artery disease)   ? DES mLAD 04/07/15; DES dRCA & DES pPDA 08/30/16; DES mCX & PTCA OM1 09/29/20  ? Chronic diastolic (congestive) heart failure (HCC)   ? Chronic pain   ? Chronic systolic (congestive) heart failure (HCC)   ? CKD (chronic kidney disease)   ? Contrast dye induced nephropathy, possible 09/03/2020  ? COPD (chronic obstructive pulmonary disease) ([place])   ? Diabetes ([place])   ? Diastolic CHF ([place])   ? Dyspnea   ? Family history of breast cancer 10/23/2021  ? Gastro-esophageal reflux disease without esophagitis   ? Hyperlipidemia   ? Hypertension   ? Hypertensive heart disease with heart failure ([place])   ? Hypoxemia   ? Idiopathic gout, multiple sites   ? Localized edema   ? Low back pain   ? Mitral regurgitation   ? Mitral regurgitation   ? Mixed hyperlipidemia   ? Mixed  incontinence   ? Morbid (severe) obesity due to excess calories ([place])   ? Neuromuscular disorder ([place])   ? Neuropathy   ? Non-ST elevation (NSTEMI) myocardial infarction [facility])   ? 08/29/20, 09/29/20  ? OSA (obstructive sleep apnea) 09/03/2020  ? Other specified hypothyroidism   ? PAF (paroxysmal atrial fibrillation) (HCC)   ? s/p pulmonary vein isolation by cryoablation 10/04/15 Dr. Minna Merritts [facility]  ? PEA (Pulseless electrical activity) ([place])   ? ~ 01/13/21 at [facility] during admission DKA, volume overload, possible CAP, hypoxia s/p ACLS with ROSC ~ 10-15  ? Pneumonia   ? Primary insomnia   ? Rheumatoid arthritis ([place])   ? Stroke [facility])   ? Thyroid disease   ? Type 2 diabetes mellitus with diabetic nephropathy ([place])   ? Vitamin D deficiency   ? ? ?Current Outpatient Medications  ?Medication Sig Dispense Refill  ?  allopurinol (ZYLOPRIM) 100 MG tablet Take 1 tablet by mouth daily.    ? amoxicillin (AMOXIL) 500 MG capsule TAKE FOUR CAPSULES BY MOUTH ONE HOUR BEFORE DENTAL PROCEDURES AND CLEANINGS    ? Aspirin (VAZALORE) 81 MG CAPS Take 1 tablet by mouth daily.    ? busPIRone (BUSPAR) 5 MG tablet TAKE ONE TABLET BY MOUTH THREE TIMES DAILY 270 tablet 1  ? carvedilol (COREG) 12.5 MG tablet Take 1 tablet (12.5 mg total) by mouth 2 (two) times daily. 60 tablet 11  ? Cholecalciferol (VITAMIN D) 50 MCG (2000 UT) tablet Take 2,000 Units by mouth daily. Take one daily    ? clopidogrel (PLAVIX) 75 MG tablet Take 1 tablet by mouth daily.    ? Cyanocobalamin 3000 MCG CAPS Take 2 capsules by mouth daily. Patient purchased 3000 mcg gummies and taking 2 gummies daily    ? EPINEPHrine 0.3 mg/0.3 mL IJ SOAJ injection Use as directed for life-threatening allergic reaction. 4 Device 3  ? Evolocumab (REPATHA SURECLICK) 101 MG/ML SOAJ Inject 1 Dose into the skin every 14 (fourteen) days. 6 mL 31  ? ezetimibe (ZETIA) 10 MG tablet Take 1 tablet (10 mg total) by mouth daily. 90 tablet 3  ? ferrous sulfate 325 (65 FE) MG tablet Take 1  tablet by mouth daily.    ? hydrALAZINE (APRESOLINE) 25 MG tablet Take 25 mg by mouth 3 (three) times daily.    ? HYDROcodone-acetaminophen (NORCO) 7.5-325 MG tablet TAKE ONE TABLET BY MOUTH three times d

## 2021-11-06 ENCOUNTER — Encounter (HOSPITAL_COMMUNITY): Payer: Self-pay

## 2021-11-06 ENCOUNTER — Ambulatory Visit (HOSPITAL_COMMUNITY)
Admission: RE | Admit: 2021-11-06 | Discharge: 2021-11-06 | Disposition: A | Discharge status: Home / Self Care | Payer: HMO | Source: Ambulatory Visit | Attending: Family Medicine | Admitting: Family Medicine

## 2021-11-06 VITALS — BP 128/52 | HR 62 | Wt 228.4 lb

## 2021-11-06 DIAGNOSIS — Z794 Long term (current) use of insulin: Secondary | ICD-10-CM | POA: Diagnosis not present

## 2021-11-06 DIAGNOSIS — R42 Dizziness and giddiness: Secondary | ICD-10-CM

## 2021-11-06 DIAGNOSIS — D6489 Other specified anemias: Secondary | ICD-10-CM | POA: Diagnosis not present

## 2021-11-06 DIAGNOSIS — Z79899 Other long term (current) drug therapy: Secondary | ICD-10-CM | POA: Diagnosis not present

## 2021-11-06 DIAGNOSIS — I5032 Chronic diastolic (congestive) heart failure: Secondary | ICD-10-CM

## 2021-11-06 DIAGNOSIS — I252 Old myocardial infarction: Secondary | ICD-10-CM | POA: Diagnosis not present

## 2021-11-06 DIAGNOSIS — E785 Hyperlipidemia, unspecified: Secondary | ICD-10-CM | POA: Insufficient documentation

## 2021-11-06 DIAGNOSIS — R002 Palpitations: Secondary | ICD-10-CM | POA: Diagnosis not present

## 2021-11-06 DIAGNOSIS — I340 Nonrheumatic mitral (valve) insufficiency: Secondary | ICD-10-CM

## 2021-11-06 DIAGNOSIS — N1832 Chronic kidney disease, stage 3b: Secondary | ICD-10-CM

## 2021-11-06 DIAGNOSIS — E1051 Type 1 diabetes mellitus with diabetic peripheral angiopathy without gangrene: Secondary | ICD-10-CM | POA: Diagnosis not present

## 2021-11-06 DIAGNOSIS — N184 Chronic kidney disease, stage 4 (severe): Secondary | ICD-10-CM | POA: Diagnosis not present

## 2021-11-06 DIAGNOSIS — E1022 Type 1 diabetes mellitus with diabetic chronic kidney disease: Secondary | ICD-10-CM | POA: Diagnosis not present

## 2021-11-06 DIAGNOSIS — Z87891 Personal history of nicotine dependence: Secondary | ICD-10-CM | POA: Diagnosis not present

## 2021-11-06 DIAGNOSIS — I2510 Atherosclerotic heart disease of native coronary artery without angina pectoris: Secondary | ICD-10-CM

## 2021-11-06 DIAGNOSIS — Z8249 Family history of ischemic heart disease and other diseases of the circulatory system: Secondary | ICD-10-CM | POA: Diagnosis not present

## 2021-11-06 DIAGNOSIS — D631 Anemia in chronic kidney disease: Secondary | ICD-10-CM | POA: Diagnosis not present

## 2021-11-06 DIAGNOSIS — G4733 Obstructive sleep apnea (adult) (pediatric): Secondary | ICD-10-CM

## 2021-11-06 DIAGNOSIS — Z833 Family history of diabetes mellitus: Secondary | ICD-10-CM | POA: Insufficient documentation

## 2021-11-06 MED ORDER — HYDRALAZINE HCL 25 MG PO TABS: 12.5000 mg | ORAL_TABLET | Freq: Three times a day (TID) | ORAL | 3 refills | Status: DC

## 2021-11-06 NOTE — Patient Instructions (Addendum)
Medication Changes:  ? ?Decrease your medication Hydralazine to 1/2 tablet 3 times daily. ? ?Lab Work: ? ?None ? ?Special Instructions: ? ?Keep a blood pressure log daily and notify clinic of low blood pressure readings ? ?Follow-Up in:  ?2-3 months with Dr. [name] ? ?At the [facility], you and your health needs are our priority. We have a designated team specialized in the treatment of Heart Failure. This Care Team includes your primary Heart Failure Specialized Cardiologist (physician), Advanced Practice Providers (APPs- Physician Assistants and Nurse Practitioners), and Pharmacist who all work together to provide you with the care you need, when you need it.  ? ?You may see any of the following providers on your designated Care Team at your next follow up: ? ?Dr [name] ?Dr Loralie Champagne ?Darrick Grinder, NP ?Lyda Jester, PA ?Jessica Milford,NP ?Marlyce Huge, PA ?Audry Riles, PharmD ? ? ?Please be sure to bring in all your medications bottles to every appointment.  ? ?Need to Contact us: ? ?If you have any questions or concerns before your next appointment please send [name] a message through [place] or call our office at [phone].   ? ?TO LEAVE A MESSAGE FOR THE NURSE SELECT OPTION 2, PLEASE LEAVE A MESSAGE INCLUDING: ?YOUR NAME ?DATE OF BIRTH ?CALL BACK NUMBER ?REASON FOR CALL**this is important as we prioritize the call backs ? ?YOU WILL RECEIVE A CALL BACK THE SAME DAY AS LONG AS YOU CALL BEFORE 4:00 PM ? ? ?

## 2021-11-07 ENCOUNTER — Encounter: Payer: Self-pay | Admitting: Genetic Counselor

## 2021-11-07 ENCOUNTER — Ambulatory Visit: Payer: Self-pay | Admitting: Genetic Counselor

## 2021-11-07 ENCOUNTER — Telehealth: Payer: Self-pay | Admitting: Genetic Counselor

## 2021-11-07 DIAGNOSIS — Z1379 Encounter for other screening for genetic and chromosomal anomalies: Secondary | ICD-10-CM | POA: Insufficient documentation

## 2021-11-07 DIAGNOSIS — Z803 Family history of malignant neoplasm of breast: Secondary | ICD-10-CM

## 2021-11-07 DIAGNOSIS — Z1231 Encounter for screening mammogram for malignant neoplasm of breast: Secondary | ICD-10-CM | POA: Insufficient documentation

## 2021-11-07 NOTE — Progress Notes (Signed)
HPI:   ?Ms. Fesler was previously seen in the [facility] due to a family history of breast cancer and concerns regarding a hereditary predisposition to cancer. Please refer to our prior cancer genetics clinic note for more information regarding our discussion, assessment and recommendations, at the time. Ms. Bellot recent genetic test results were disclosed to her, as were recommendations warranted by these results. These results and recommendations are discussed in more detail below. ? ?CANCER HISTORY:  ?Oncology History  ? No history exists.  ? ? ?FAMILY HISTORY:  ?We obtained a detailed, 4-generation family history.  Significant diagnoses are listed below: ?     ?Family History  ?Problem Relation Age of Onset  ? Breast cancer Mother 68  ?      contralateral breast ca dx 45  ? Cancer Other    ?      MGF's sisters x2; unknown type  ?  ?  ?Ms. Conerly is unaware of previous family history of genetic testing for hereditary cancer risks. There is no reported Ashkenazi Jewish ancestry. There is no known consanguinity. ?  ? ?GENETIC TEST RESULTS:  ?The [facility] CustomNext-Cancer +RNAinsight Panel found no pathogenic mutations. The CustomNext-Cancer+RNAinsight panel offered by [facility] includes sequencing and rearrangement analysis for the following 47 genes:  APC, ATM, AXIN2, BARD1, BMPR1A, BRCA1, BRCA2, BRIP1, CDH1, CDK4, CDKN2A, CHEK2, DICER1, EPCAM, GREM1, HOXB13, MEN1, MLH1, MSH2, MSH3, MSH6, MUTYH, NBN, NF1, NF2, NTHL1, PALB2, PMS2, POLD1, POLE, PTEN, RAD51C, RAD51D, RECQL, RET, SDHA, SDHAF2, SDHB, SDHC, SDHD, SMAD4, SMARCA4, STK11, TP53, TSC1, TSC2, and VHL.  RNA data is routinely analyzed for use in variant interpretation for all genes. ? ? ?The test report has been scanned into EPIC and is located under the Molecular Pathology section of the Results Review tab.  A portion of the result report is included below for reference. Genetic testing reported out on November 02, 2021.   ? ? ? ? ?Even though a pathogenic variant was not identified, possible explanations for the cancer in the family may include: ?There may be no hereditary risk for cancer in the family. The cancers in Ms. Jaeger's family may be sporadic/familial or due to other genetic and environmental factors. ?There may be a gene mutation in one of these genes that current testing methods cannot detect but that chance is small. ?There could be another gene that has not yet been discovered, or that we have not yet tested, that is responsible for the cancer diagnoses in the family.  ?It is also possible there is a hereditary cause for the cancer in the family that Ms. Kilcrease did not inherit. ? ? ?Therefore, it is important to remain in touch with cancer genetics in the future so that we can continue to offer Ms. Simoneau the most up to date genetic testing.  ? ?ADDITIONAL GENETIC TESTING:  ?We discussed with Ms. Laprise that her genetic testing was fairly extensive.  If there are genes identified to increase cancer risk that can be analyzed in the future, we would be happy to discuss and coordinate this testing at that time.   ? ?CANCER SCREENING RECOMMENDATIONS:  ?Ms. Vines's test result is considered negative (normal).  This means that we have not identified a hereditary cause for her family history of cancer at this time.  ? ?An individual's cancer risk and medical management are not determined by genetic test results alone. Overall cancer risk assessment incorporates additional factors, including personal medical history, family history, and any  available genetic information that may result in a personalized plan for cancer prevention and surveillance. Therefore, it is recommended she continue to follow the cancer management and screening guidelines provided by her primary healthcare provider. ? ?RECOMMENDATIONS FOR FAMILY MEMBERS:   ?Since she did not inherit a identifiable mutation in a cancer predisposition gene included on  this panel, her children could not have inherited a known mutation from her in one of these genes. ?Individuals in this family might be at some increased risk of developing cancer, over the general population risk, due to the family history of cancer.  Individuals in the family should notify their providers of the family history of cancer. We recommend women in this family have a yearly mammogram beginning at age 50, or 76 years younger than the earliest onset of cancer, an annual clinical breast exam, and perform monthly breast self-exams.   ?Other members of the family may still carry a pathogenic variant in one of these genes that Ms. Caetano did not inherit. Based on the family history, we recommend her mother, who was diagnosed with two breast primaries, have genetic counseling and testing. Ms. Shiffer stated that her mother is currently undergoing genetics through [facility].  ? ?FOLLOW-UP:  ?Lastly, we discussed with Ms. Cubillos that cancer genetics is a rapidly advancing field and it is possible that new genetic tests will be appropriate for her and/or her family members in the future. We encouraged her to remain in contact with cancer genetics on an annual basis so we can update her personal and family histories and let her know of advances in cancer genetics that may benefit this family.  ? ?Our contact number was provided. Ms. Hillmann questions were answered to her satisfaction, and she knows she is welcome to call us at anytime with additional questions or concerns.  ? ?Etha Stambaugh M. Joette Catching, [place], [place] ?Genetic Counselor ?[email or web address] ?([phone]) [phone] ? ?

## 2021-11-07 NOTE — Telephone Encounter (Signed)
Revealed negative genetic testing.  Discussed that we do not know why there is cancer in the family. It could be sporadic/famillial, due to a change in a gene that she did not inherit, due to a different gene that we are not testing, or maybe our current technology may not be able to pick something up.  It will be important for her to keep in contact with genetics to keep up with whether additional testing may be needed.   ? ? ?

## 2021-11-08 ENCOUNTER — Telehealth: Payer: Self-pay

## 2021-11-08 NOTE — Telephone Encounter (Signed)
Called patient to check in and see if she needed anything. Patient has had multiple doctor appts this month. She states she is doing well and is very appreciative we reached out. Will f/u PRN ?

## 2021-11-10 ENCOUNTER — Other Ambulatory Visit: Payer: HMO

## 2021-11-10 ENCOUNTER — Inpatient Hospital Stay: Payer: HMO

## 2021-11-10 VITALS — BP 125/70 | HR 65 | Temp 98.1°F | Resp 20 | Ht 64.0 in | Wt 228.0 lb

## 2021-11-10 DIAGNOSIS — N184 Chronic kidney disease, stage 4 (severe): Secondary | ICD-10-CM | POA: Diagnosis not present

## 2021-11-10 DIAGNOSIS — N189 Chronic kidney disease, unspecified: Secondary | ICD-10-CM

## 2021-11-10 MED ORDER — EPOETIN ALFA-EPBX 20000 UNIT/ML IJ SOLN
20000.0000 [IU] | Freq: Once | INTRAMUSCULAR | Status: AC
  Administered 2021-11-10: 20000 [IU] via SUBCUTANEOUS
  Filled 2021-11-10: qty 1

## 2021-11-10 NOTE — Patient Instructions (Signed)
Epoetin Alfa injection ?What is this medication? ?EPOETIN ALFA (e POE e tin AL fa) helps your body make more red blood cells. This medicine is used to treat anemia caused by chronic kidney disease, cancer chemotherapy, or HIV-therapy. It may also be used before surgery if you have anemia. ?This medicine may be used for other purposes; ask your health care provider or pharmacist if you have questions. ?COMMON BRAND NAME(S): Epogen, Procrit, Retacrit ?What should I tell my care team before I take this medication? ?They need to know if you have any of these conditions: ?cancer ?heart disease ?high blood pressure ?history of blood clots ?history of stroke ?low levels of folate, iron, or vitamin B12 in the blood ?seizures ?an unusual or allergic reaction to erythropoietin, albumin, benzyl alcohol, hamster proteins, other medicines, foods, dyes, or preservatives ?pregnant or trying to get pregnant ?breast-feeding ?How should I use this medication? ?This medicine is for injection into a vein or under the skin. It is usually given by a health care professional in a hospital or clinic setting. ?If you get this medicine at home, you will be taught how to prepare and give this medicine. Use exactly as directed. Take your medicine at regular intervals. Do not take your medicine more often than directed. ?It is important that you put your used needles and syringes in a special sharps container. Do not put them in a trash can. If you do not have a sharps container, call your pharmacist or healthcare provider to get one. ?A special MedGuide will be given to you by the pharmacist with each prescription and refill. Be sure to read this information carefully each time. ?Talk to your pediatrician regarding the use of this medicine in children. While this drug may be prescribed for selected conditions, precautions do apply. ?Overdosage: If you think you have taken too much of this medicine contact a poison control center or emergency  room at once. ?NOTE: This medicine is only for you. Do not share this medicine with others. ?What if I miss a dose? ?If you miss a dose, take it as soon as you can. If it is almost time for your next dose, take only that dose. Do not take double or extra doses. ?What may interact with this medication? ?Interactions have not been studied. ?This list may not describe all possible interactions. Give your health care provider a list of all the medicines, herbs, non-prescription drugs, or dietary supplements you use. Also tell them if you smoke, drink alcohol, or use illegal drugs. Some items may interact with your medicine. ?What should I watch for while using this medication? ?Your condition will be monitored carefully while you are receiving this medicine. ?You may need blood work done while you are taking this medicine. ?This medicine may cause a decrease in vitamin B6. You should make sure that you get enough vitamin B6 while you are taking this medicine. Discuss the foods you eat and the vitamins you take with your health care professional. ?What side effects may I notice from receiving this medication? ?Side effects that you should report to your doctor or health care professional as soon as possible: ?allergic reactions like skin rash, itching or hives, swelling of the face, lips, or tongue ?seizures ?signs and symptoms of a blood clot such as breathing problems; changes in vision; chest pain; severe, sudden headache; pain, swelling, warmth in the leg; trouble speaking; sudden numbness or weakness of the face, arm or leg ?signs and symptoms of a stroke like   changes in vision; confusion; trouble speaking or understanding; severe headaches; sudden numbness or weakness of the face, arm or leg; trouble walking; dizziness; loss of balance or coordination ?Side effects that usually do not require medical attention (report to your doctor or health care professional if they continue or are  bothersome): ?chills ?cough ?dizziness ?fever ?headaches ?joint pain ?muscle cramps ?muscle pain ?nausea, vomiting ?pain, redness, or irritation at site where injected ?This list may not describe all possible side effects. Call your doctor for medical advice about side effects. You may report side effects to FDA at 1-800-FDA-1088. ?Where should I keep my medication? ?Keep out of the reach of children. ?Store in a refrigerator between 2 and 8 degrees C (36 and 46 degrees F). Do not freeze or shake. Throw away any unused portion if using a single-dose vial. Multi-dose vials can be kept in the refrigerator for up to 21 days after the initial dose. Throw away unused medicine. ?NOTE: This sheet is a summary. It may not cover all possible information. If you have questions about this medicine, talk to your doctor, pharmacist, or health care provider. ?? 2023 Elsevier/Gold Standard (2017-03-05 00:00:00) ? ?

## 2021-11-11 ENCOUNTER — Encounter: Payer: Self-pay | Admitting: Oncology

## 2021-11-13 LAB — HM DIABETES EYE EXAM

## 2021-11-14 DIAGNOSIS — F411 Generalized anxiety disorder: Secondary | ICD-10-CM | POA: Diagnosis not present

## 2021-11-23 ENCOUNTER — Other Ambulatory Visit: Payer: HMO

## 2021-11-24 ENCOUNTER — Ambulatory Visit: Payer: HMO

## 2021-11-24 ENCOUNTER — Other Ambulatory Visit: Payer: Self-pay | Admitting: Pharmacist

## 2021-11-27 ENCOUNTER — Inpatient Hospital Stay: Payer: HMO | Attending: Hematology and Oncology

## 2021-11-27 VITALS — BP 139/57 | HR 70 | Temp 97.9°F | Resp 20 | Ht 64.0 in | Wt 230.8 lb

## 2021-11-27 DIAGNOSIS — D631 Anemia in chronic kidney disease: Secondary | ICD-10-CM | POA: Insufficient documentation

## 2021-11-27 DIAGNOSIS — N184 Chronic kidney disease, stage 4 (severe): Secondary | ICD-10-CM | POA: Diagnosis not present

## 2021-11-27 MED ORDER — EPOETIN ALFA-EPBX 20000 UNIT/ML IJ SOLN
20000.0000 [IU] | Freq: Once | INTRAMUSCULAR | Status: AC
  Administered 2021-11-27: 20000 [IU] via SUBCUTANEOUS
  Filled 2021-11-27: qty 1

## 2021-11-27 NOTE — Patient Instructions (Signed)
Epoetin Alfa injection ?What is this medication? ?EPOETIN ALFA (e POE e tin AL fa) helps your body make more red blood cells. This medicine is used to treat anemia caused by chronic kidney disease, cancer chemotherapy, or HIV-therapy. It may also be used before surgery if you have anemia. ?This medicine may be used for other purposes; ask your health care provider or pharmacist if you have questions. ?COMMON BRAND NAME(S): Epogen, Procrit, Retacrit ?What should I tell my care team before I take this medication? ?They need to know if you have any of these conditions: ?cancer ?heart disease ?high blood pressure ?history of blood clots ?history of stroke ?low levels of folate, iron, or vitamin B12 in the blood ?seizures ?an unusual or allergic reaction to erythropoietin, albumin, benzyl alcohol, hamster proteins, other medicines, foods, dyes, or preservatives ?pregnant or trying to get pregnant ?breast-feeding ?How should I use this medication? ?This medicine is for injection into a vein or under the skin. It is usually given by a health care professional in a hospital or clinic setting. ?If you get this medicine at home, you will be taught how to prepare and give this medicine. Use exactly as directed. Take your medicine at regular intervals. Do not take your medicine more often than directed. ?It is important that you put your used needles and syringes in a special sharps container. Do not put them in a trash can. If you do not have a sharps container, call your pharmacist or healthcare provider to get one. ?A special MedGuide will be given to you by the pharmacist with each prescription and refill. Be sure to read this information carefully each time. ?Talk to your pediatrician regarding the use of this medicine in children. While this drug may be prescribed for selected conditions, precautions do apply. ?Overdosage: If you think you have taken too much of this medicine contact a poison control center or emergency  room at once. ?NOTE: This medicine is only for you. Do not share this medicine with others. ?What if I miss a dose? ?If you miss a dose, take it as soon as you can. If it is almost time for your next dose, take only that dose. Do not take double or extra doses. ?What may interact with this medication? ?Interactions have not been studied. ?This list may not describe all possible interactions. Give your health care provider a list of all the medicines, herbs, non-prescription drugs, or dietary supplements you use. Also tell them if you smoke, drink alcohol, or use illegal drugs. Some items may interact with your medicine. ?What should I watch for while using this medication? ?Your condition will be monitored carefully while you are receiving this medicine. ?You may need blood work done while you are taking this medicine. ?This medicine may cause a decrease in vitamin B6. You should make sure that you get enough vitamin B6 while you are taking this medicine. Discuss the foods you eat and the vitamins you take with your health care professional. ?What side effects may I notice from receiving this medication? ?Side effects that you should report to your doctor or health care professional as soon as possible: ?allergic reactions like skin rash, itching or hives, swelling of the face, lips, or tongue ?seizures ?signs and symptoms of a blood clot such as breathing problems; changes in vision; chest pain; severe, sudden headache; pain, swelling, warmth in the leg; trouble speaking; sudden numbness or weakness of the face, arm or leg ?signs and symptoms of a stroke like   changes in vision; confusion; trouble speaking or understanding; severe headaches; sudden numbness or weakness of the face, arm or leg; trouble walking; dizziness; loss of balance or coordination ?Side effects that usually do not require medical attention (report to your doctor or health care professional if they continue or are  bothersome): ?chills ?cough ?dizziness ?fever ?headaches ?joint pain ?muscle cramps ?muscle pain ?nausea, vomiting ?pain, redness, or irritation at site where injected ?This list may not describe all possible side effects. Call your doctor for medical advice about side effects. You may report side effects to FDA at 1-800-FDA-1088. ?Where should I keep my medication? ?Keep out of the reach of children. ?Store in a refrigerator between 2 and 8 degrees C (36 and 46 degrees F). Do not freeze or shake. Throw away any unused portion if using a single-dose vial. Multi-dose vials can be kept in the refrigerator for up to 21 days after the initial dose. Throw away unused medicine. ?NOTE: This sheet is a summary. It may not cover all possible information. If you have questions about this medicine, talk to your doctor, pharmacist, or health care provider. ?? 2023 Elsevier/Gold Standard (2017-03-05 00:00:00) ? ?

## 2021-11-28 ENCOUNTER — Encounter: Payer: Self-pay | Admitting: Oncology

## 2021-11-28 DIAGNOSIS — F411 Generalized anxiety disorder: Secondary | ICD-10-CM | POA: Diagnosis not present

## 2021-11-29 ENCOUNTER — Other Ambulatory Visit: Payer: Self-pay | Admitting: Family Medicine

## 2021-12-01 ENCOUNTER — Telehealth: Payer: Self-pay | Admitting: Oncology

## 2021-12-01 ENCOUNTER — Other Ambulatory Visit: Payer: HMO

## 2021-12-01 ENCOUNTER — Ambulatory Visit: Payer: HMO | Admitting: Oncology

## 2021-12-01 NOTE — Telephone Encounter (Signed)
pATIENT CAME TO APPT TODAY - Appt showed to be canceled and rescheduled to 5/25

## 2021-12-06 NOTE — Progress Notes (Signed)
[facility]  [address] [place],  [place]  [address] [phone]  Clinic Day:  12/07/21  Referring physician: [name], MD  eASSESSMENT & PLAN:   Chronic anemia that has been worsening.  This is anemia of chronic kidney disease/anemia of chronic disease.  Repeat iron studies in March did not reveal iron deficiency, soluble transferrin receptor was normal. No hemolysis was found.  Serum protein electrophoresis is negative for a monoclonal spike.  I therefore recommended that she increase the Retacrit to every 2 weeks. Stool Hemoccults were negative.  Her hemoglobin has come up from 8.4 to 9.1.  2.   Strong family history of breast cancer.  I referred her to the genetic counselor.   I increased her Retacrit to 20,000 units every 2 weeks and she seems to be responding to that.  I will recheck her iron studies.  We will check her monthly with labs and she already has that scheduled.  She understands and agrees with this plan of care.  I provided 20 minutes of face-to-face time during this this encounter and > 50% was spent counseling as documented under my assessment and plan.    Derwood Kaplan, MD [facility] [address] [place] [address] Dept: [phone] Dept Fax: [phone]    CHIEF COMPLAINT:  CC: Iron deficiency  Current Treatment:  Observation   HISTORY OF PRESENT ILLNESS:  Dana [name] is a 61 y.o. female with  a complicated medical history, including stage IV chronic kidney disease, who is referred in consultation by Dr. Biagio Quint, nephrology, for assessment and management of iron deficiency.  She had worsening anemia, so was given a dose of Retacrit in December.  She then was then found to have a low iron saturation of 11% with a TIBC of 271 and ferritin 106 on February 10th, and gives a history of allergic reaction to IV iron, so was referred here.   She states she broke out in severe hives after receiving IV iron in the past 1 to 2 years.  She states she also has had blood transfusion on several occasions, most recent being in December 2022.  She has a history of hypoxic brain injury in June 2022, so has difficulty with dates. She reports severe fatigue as well as dyspnea with limited exertion.  She denies overt form of blood loss.  She is on clopidogrel. Review of her records reveal she received 1 unit of packed cells in November 2022 when her hemoglobin was 7.2.  She was given IV Feraheme in February 2022 when admitted for acute on chronic diastolic heart failure.  She reported severe generalized itching after receiving the first dose of Feraheme.  No hives were noted by the examining provider.  The 2nd dose of Feraheme was not given.  She was transfused in February 2022 when her hemoglobin was 7.8 and again in March 2022 when her hemoglobin was 7.   In 2017, when she reported rectal bleeding, as well as black tarry stools, EGD and colonoscopy were recommended.  EGD in September 2017 revealed a pyloric erosion.  I was unable to access the colonoscopy report from September 2017. She had EGD in September 2014 that was normal.  She was diagnosed with gastroparesis in September 2014.  It was noted in her chart that she had a colonoscopy in April 2014 which was normal, but I could not find a report.  She was  also transfused in April 2013 at [facility] when her hemoglobin was 7.4.  She has had chronic anemia since 2010.   Her family history is significant in that her mother had breast cancer twice and ended up with bilateral mastectomies.  She also has 2 maternal aunts that had breast cancer.  I will therefore refer her to the genetic counselor for their opinion regarding testing.  She does not know if her mother has been tested.  INTERVAL HISTORY:  I have reviewed her chart and materials related to her extensively and collaborated history with the  patient. Summary of oncologic history is as follows: Oncology History   No history exists.  Dana [name] is here for routine follow up of anemia of chronic disease, associated with chronic kidney disease stage IV.  Her hemoglobin was down to 8.4 last time and so we changed her Retacrit injections to every 2 weeks.  She feels fair but still has severe fatigue.  She also has some anxiety and occasional depression.  We did check her iron studies in March and they were normal, but we will repeat them today.  She has chronic low back pain and a 9 out of 10.  She does describe dyspnea with her congestive heart failure and her torsemide dose is increased to 80 mg daily.  Her hemoglobin is up from 8.4 to 9.1 today.  Her MCV is low normal at 82. Her latest renal function last week revealed a BUN of 61 with a creatinine of 2.00.  Hopefully she is responding to the increase in the Retacrit injections.  Her  appetite is good, and she has lost 1 pound since her last visit.  She denies fever, chills or other signs of infection.  She denies nausea, vomiting, bowel issues, or abdominal pain.  She denies sore throat, cough, dyspnea, or chest pain.  HISTORY:   Past Medical History:  Diagnosis Date   Acquired absence of left great toe (HCC)    Acquired absence of other left toe(s) ([place])    ACS (acute coronary syndrome) ([place])    Anemia    Anoxic brain injury ([place])    Anxiety    CAD (coronary artery disease)    DES mLAD 04/07/15; DES dRCA & DES pPDA 08/30/16; DES mCX & PTCA OM1 09/29/20   Chronic diastolic (congestive) heart failure (HCC)    Chronic pain    Chronic systolic (congestive) heart failure (HCC)    CKD (chronic kidney disease)    Contrast dye induced nephropathy, possible 09/03/2020   COPD (chronic obstructive pulmonary disease) (HCC)    Diabetes (HCC)    Diastolic CHF (HCC)    Dyspnea    Family history of breast cancer 10/23/2021   Gastro-esophageal reflux disease without esophagitis    Hyperlipidemia     Hypertension    Hypertensive heart disease with heart failure (HCC)    Hypoxemia    Idiopathic gout, multiple sites    Localized edema    Low back pain    Mitral regurgitation    Mitral regurgitation    Mixed hyperlipidemia    Mixed incontinence    Morbid (severe) obesity due to excess calories (HCC)    Neuromuscular disorder (HCC)    Neuropathy    Non-ST elevation (NSTEMI) myocardial infarction ([place])    08/29/20, 09/29/20   OSA (obstructive sleep apnea) 09/03/2020   Other specified hypothyroidism    PAF (paroxysmal atrial fibrillation) (HCC)    s/p pulmonary vein isolation by cryoablation 10/04/15 Dr. Minna Merritts in  [place]   PEA (Pulseless electrical activity) ([place])    ~ 01/13/21 at [facility] during admission DKA, volume overload, possible CAP, hypoxia s/p ACLS with ROSC ~ 10-15   Pneumonia    Primary insomnia    Rheumatoid arthritis ([place])    Stroke ([place])    Thyroid disease    Type 2 diabetes mellitus with diabetic nephropathy ([place])    Vitamin D deficiency     Past Surgical History:  Procedure Laterality Date   ABDOMINAL HYSTERECTOMY     Partial- Still has both ovaries   CARDIOVASCULAR STRESS TEST  08/13/2014   Nuclear; Normal   CARPAL TUNNEL RELEASE     CATARACT EXTRACTION, BILATERAL     CESAREAN SECTION     CHOLECYSTECTOMY     CORONARY ANGIOPLASTY WITH STENT PLACEMENT     3 blockages/ 1 stent   CORONARY STENT INTERVENTION N/A 09/29/2020   Procedure: CORONARY STENT INTERVENTION;  Surgeon: Sherren Mocha, MD;  Location: [facility];  Service: Cardiovascular;  Laterality: N/A;  CFX   EYE SURGERY     LEFT HEART CATH AND CORONARY ANGIOGRAPHY N/A 09/29/2020   Procedure: LEFT HEART CATH AND CORONARY ANGIOGRAPHY;  Surgeon: Sherren Mocha, MD;  Location: [facility];  Service: Cardiovascular;  Laterality: N/A;   MITRAL VALVE REPAIR N/A 05/18/2021   Procedure: MITRAL VALVE REPAIR;  Surgeon: Early Osmond, MD;  Location: [facility];  Service:  Cardiovascular;  Laterality: N/A;   RIGHT HEART CATH N/A 04/27/2021   Procedure: RIGHT HEART CATH;  Surgeon: Jolaine Artist, MD;  Location: [facility];  Service: Cardiovascular;  Laterality: N/A;   RIGHT/LEFT HEART CATH AND CORONARY ANGIOGRAPHY N/A 01/07/2017   Procedure: Right/Left Heart Cath and Coronary Angiography;  Surgeon: Larey Dresser, MD;  Location: [facility];  Service: Cardiovascular;  Laterality: N/A;   ROTATOR CUFF REPAIR     TEE WITHOUT CARDIOVERSION N/A 04/28/2021   Procedure: TRANSESOPHAGEAL ECHOCARDIOGRAM (TEE);  Surgeon: Jolaine Artist, MD;  Location: [facility] ENDOSCOPY;  Service: Cardiovascular;  Laterality: N/A;   TEE WITHOUT CARDIOVERSION N/A 05/18/2021   Procedure: TRANSESOPHAGEAL ECHOCARDIOGRAM (TEE);  Surgeon: Early Osmond, MD;  Location: [facility];  Service: Cardiovascular;  Laterality: N/A;   TOE AMPUTATION Left    US ECHOCARDIOGRAPHY  05/2017   Normal    Family History  Problem Relation Age of Onset   Breast cancer Mother 55       contralateral breast ca dx 30   Coronary artery disease Mother    Hypertension Mother    Hyperlipidemia Mother    Diabetes type II Mother    Lung cancer Mother 41   Diabetes Father    Hypertension Father    Mental illness Sister    Bipolar disorder Other    Depression Other    Schizophrenia Other    Cancer Other        MGF's sisters x2; unknown type    Social History:  reports that she quit smoking about 39 years ago. Her smoking use included cigarettes. She started smoking about 40 years ago. She has never used smokeless tobacco. She reports that she does not drink alcohol and does not use drugs.The patient is alone today.  Allergies:  Allergies  Allergen Reactions   Naproxen Hives and Rash   Celecoxib Other (See Comments)    Stomach pain   Aspirin Other (See Comments)    CAN NOT TAKE DUE TO ULCERS (documented previously)  but takes every day currently (as of 09/29/20) without reported  issues Can take coated aspirin   Glucosamine Hives, Swelling and Other (See Comments)    Angioedema    Iron     IV iron transfusion - hives - Feb 2022 hospitalization   Metoclopramide Other (See Comments)    Facial twitching and stuttering   Metolazone Other (See Comments)    Acute renal failure   Other Hives    Strawberry allergy   Shellfish-Derived Products Hives, Swelling and Other (See Comments)    Angioedema    Statins Other (See Comments)    Muscle pain   Strawberry (Diagnostic)    Strawberry Extract    Strawberry Flavor    Strawberry Leaves Extract    Tramadol Nausea And Vomiting         Current Medications: Current Outpatient Medications  Medication Sig Dispense Refill   allopurinol (ZYLOPRIM) 100 MG tablet Take 1 tablet by mouth daily.     amoxicillin (AMOXIL) 500 MG capsule TAKE FOUR CAPSULES BY MOUTH ONE HOUR BEFORE DENTAL PROCEDURES AND CLEANINGS     busPIRone (BUSPAR) 5 MG tablet TAKE ONE TABLET BY MOUTH THREE TIMES DAILY 270 tablet 1   carvedilol (COREG) 12.5 MG tablet Take 1 tablet (12.5 mg total) by mouth 2 (two) times daily. 60 tablet 11   Cholecalciferol (VITAMIN D) 50 MCG (2000 UT) tablet Take 2,000 Units by mouth daily. Take one daily     clopidogrel (PLAVIX) 75 MG tablet Take 1 tablet by mouth daily.     Cyanocobalamin 3000 MCG CAPS Take 2 capsules by mouth daily. Patient purchased 3000 mcg gummies and taking 2 gummies daily     EPINEPHrine 0.3 mg/0.3 mL IJ SOAJ injection Use as directed for life-threatening allergic reaction. 4 Device 3   Evolocumab (REPATHA SURECLICK) 161 MG/ML SOAJ Inject 1 Dose into the skin every 14 (fourteen) days. 6 mL 31   ezetimibe (ZETIA) 10 MG tablet Take 1 tablet (10 mg total) by mouth daily. 90 tablet 3   ferrous sulfate 325 (65 FE) MG tablet Take 1 tablet by mouth daily.     hydrALAZINE (APRESOLINE) 25 MG tablet Take 0.5 tablets (12.5 mg total) by mouth 3 (three) times daily. 45 tablet 3   HYDROcodone-acetaminophen (NORCO)  7.5-325 MG tablet TAKE ONE TABLET BY MOUTH three times daily AS NEEDED FOR moderate pain 90 tablet 0   insulin aspart (NOVOLOG FLEXPEN) 100 UNIT/ML FlexPen 2-10 U before meals based on SSI. 15 mL 3   Insulin Pen Needle (BD PEN NEEDLE NANO 2ND GEN) 32G X 4 MM MISC Inject 1 each into the skin in the morning, at noon, in the evening, and at bedtime. 600 each 3   isosorbide mononitrate (IMDUR) 30 MG 24 hr tablet Take 1 tablet by mouth daily.     lactulose (CHRONULAC) 10 GM/15ML solution Take 15 mLs (10 g total) by mouth daily as needed for mild constipation. 236 mL 1   latanoprost (XALATAN) 0.005 % ophthalmic solution Place 1 drop into both eyes at bedtime.     LEVEMIR FLEXTOUCH 100 UNIT/ML FlexTouch Pen INJECT 50 UNITS into THE SKIN DAILY 15 mL 1   LORazepam (ATIVAN) 0.5 MG tablet TAKE ONE TABLET BY MOUTH DAILY AS NEEDED FOR ANXIETY 30 tablet 2   magnesium oxide (MAG-OX) 400 MG tablet Take 2 tablets (800 mg total) by mouth daily. Take 2 (400 mg) daily=800 mg 60 tablet 1   nitroGLYCERIN (NITROSTAT) 0.4 MG SL tablet Place 1 tablet (0.4  mg total) under the tongue every 5 (five) minutes as needed for chest pain. 25 tablet 3   Omega-3 Fatty Acids (FISH OIL PO) Take 1 capsule by mouth daily.     OXYGEN Inhale 2 L into the lungs at bedtime.     pantoprazole (PROTONIX) 40 MG tablet Take 1 tablet (40 mg total) by mouth daily. 90 tablet 1   potassium chloride SA (KLOR-CON M) 20 MEQ tablet Take 1 tablet by mouth daily.     Probiotic CAPS Take 1 capsule by mouth daily.     promethazine (PHENERGAN) 25 MG tablet Take 1 tablet (25 mg total) by mouth as needed. 30 tablet 3   torsemide (DEMADEX) 20 MG tablet Take 4 tablets (80 mg total) by mouth daily. 130 tablet 5   No current facility-administered medications for this visit.    REVIEW OF SYSTEMS:  Review of Systems  Constitutional:  Positive for fatigue.  Respiratory:  Positive for shortness of breath.   Musculoskeletal:  Positive for back pain.      VITALS:  Blood pressure (!) 158/71, pulse 71, temperature 97.7 F (36.5 C), temperature source Oral, resp. rate 18, height '5\' 4"'$  (1.626 m), weight 230 lb 3.2 oz (104.4 kg), SpO2 97 %.  Wt Readings from Last 3 Encounters:  12/25/21 234 lb (106.1 kg)  12/21/21 234 lb (106.1 kg)  12/18/21 232 lb 3.2 oz (105.3 kg)    Body mass index is 39.51 kg/m.  Performance status (ECOG): 1 - Symptomatic but completely ambulatory  PHYSICAL EXAM:  Physical Exam Constitutional:      Appearance: Normal appearance.  HENT:     Head: Normocephalic and atraumatic.     Nose: Nose normal.     Mouth/Throat:     Pharynx: Oropharynx is clear.  Eyes:     Extraocular Movements: Extraocular movements intact.     Conjunctiva/sclera: Conjunctivae normal.     Pupils: Pupils are equal, round, and reactive to light.  Cardiovascular:     Rate and Rhythm: Normal rate and regular rhythm.     Heart sounds: Normal heart sounds.  Pulmonary:     Breath sounds: Normal breath sounds.  Abdominal:     General: Bowel sounds are normal.     Palpations: Abdomen is soft.  Musculoskeletal:        General: Normal range of motion.     Cervical back: Normal range of motion.  Skin:    General: Skin is warm.  Neurological:     General: No focal deficit present.     Mental Status: She is alert and oriented to person, place, and time.  Psychiatric:        Mood and Affect: Mood normal.        Behavior: Behavior normal.        Thought Content: Thought content normal.        Judgment: Judgment normal.   LABS:      Latest Ref Rng & Units 12/25/2021   12:00 AM 12/07/2021   12:00 AM 10/31/2021   12:00 AM  CBC  WBC  10.8     9.8     11.6      Hemoglobin 12.0 - 16.0 8.4     9.1     8.4      Hematocrit 36 - 46 '26     29     26      '$ Platelets 150 - 400 K/uL 225     255     283  This result is from an external source.      Latest Ref Rng & Units 12/25/2021   12:00 AM 12/07/2021   12:00 AM 10/26/2021   11:41 AM   CMP  Glucose 70 - 99 mg/dL   116   BUN 4 - 21 65     61     70   Creatinine 0.5 - 1.1 2.0     2.0     2.34   Sodium 137 - 147 138     138     143   Potassium 3.5 - 5.1 mEq/L 4.3     4.3     4.5   Chloride 99 - 108 102     99     102   CO2 13 - '22 24     26     24   '$ Calcium 8.7 - 10.7 8.2     8.7     9.3   Alkaline Phos 25 - 125 129     142       AST 13 - 35 25     27       ALT 7 - 35 U/L 20     16          This result is from an external source.    Lab Results  Component Value Date   TOTALPROTELP 7.3 09/13/2021   ALBUMINELP 3.5 09/13/2021   A1GS 0.3 09/13/2021   A2GS 0.8 09/13/2021   BETS 1.0 09/13/2021   GAMS 1.8 09/13/2021   MSPIKE Not Observed 09/13/2021   Lab Results  Component Value Date   TIBC 333 12/07/2021   TIBC 328 09/13/2021   TIBC 271 08/25/2021   FERRITIN 34 12/07/2021   FERRITIN 64 09/13/2021   FERRITIN 106 08/25/2021   IRONPCTSAT 12 12/07/2021   IRONPCTSAT 12 09/13/2021   IRONPCTSAT 11 (L) 08/25/2021   Lab Results  Component Value Date   LDH 176 09/13/2021    STUDIES:  No results found.

## 2021-12-07 ENCOUNTER — Other Ambulatory Visit: Payer: Self-pay | Admitting: Oncology

## 2021-12-07 ENCOUNTER — Inpatient Hospital Stay: Payer: HMO | Admitting: Oncology

## 2021-12-07 ENCOUNTER — Encounter: Payer: Self-pay | Admitting: Oncology

## 2021-12-07 ENCOUNTER — Inpatient Hospital Stay: Payer: HMO

## 2021-12-07 VITALS — BP 158/71 | HR 71 | Temp 97.7°F | Resp 18 | Ht 64.0 in | Wt 230.2 lb

## 2021-12-07 DIAGNOSIS — N189 Chronic kidney disease, unspecified: Secondary | ICD-10-CM

## 2021-12-07 DIAGNOSIS — D631 Anemia in chronic kidney disease: Secondary | ICD-10-CM

## 2021-12-07 DIAGNOSIS — Z803 Family history of malignant neoplasm of breast: Secondary | ICD-10-CM | POA: Diagnosis not present

## 2021-12-07 DIAGNOSIS — N184 Chronic kidney disease, stage 4 (severe): Secondary | ICD-10-CM | POA: Diagnosis not present

## 2021-12-07 DIAGNOSIS — D649 Anemia, unspecified: Secondary | ICD-10-CM | POA: Diagnosis not present

## 2021-12-07 LAB — FERRITIN: Ferritin: 34 ng/mL (ref 11–307)

## 2021-12-07 LAB — CBC AND DIFFERENTIAL
HCT: 29 — AB (ref 36–46)
Hemoglobin: 9.1 — AB (ref 12.0–16.0)
Neutrophils Absolute: 7.15
Platelets: 255 10*3/uL (ref 150–400)
WBC: 9.8

## 2021-12-07 LAB — BASIC METABOLIC PANEL
BUN: 61 — AB (ref 4–21)
CO2: 26 — AB (ref 13–22)
Chloride: 99 (ref 99–108)
Creatinine: 2 — AB (ref 0.5–1.1)
Glucose: 302
Potassium: 4.3 mEq/L (ref 3.5–5.1)
Sodium: 138 (ref 137–147)

## 2021-12-07 LAB — HEPATIC FUNCTION PANEL
ALT: 16 U/L (ref 7–35)
AST: 27 (ref 13–35)
Alkaline Phosphatase: 142 — AB (ref 25–125)
Bilirubin, Total: 0.5

## 2021-12-07 LAB — IRON AND TIBC
Iron: 41 ug/dL (ref 28–170)
Saturation Ratios: 12 % (ref 10.4–31.8)
TIBC: 333 ug/dL (ref 250–450)
UIBC: 292 ug/dL

## 2021-12-07 LAB — COMPREHENSIVE METABOLIC PANEL
Albumin: 3.9 (ref 3.5–5.0)
Calcium: 8.7 (ref 8.7–10.7)

## 2021-12-07 LAB — CBC: RBC: 3.54 — AB (ref 3.87–5.11)

## 2021-12-08 ENCOUNTER — Encounter: Payer: Self-pay | Admitting: Oncology

## 2021-12-08 ENCOUNTER — Other Ambulatory Visit: Payer: Self-pay | Admitting: Oncology

## 2021-12-08 ENCOUNTER — Ambulatory Visit: Payer: HMO

## 2021-12-08 ENCOUNTER — Other Ambulatory Visit: Payer: HMO

## 2021-12-08 NOTE — Addendum Note (Signed)
Addended by: Juanetta Beets on: 12/08/2021 01:36 PM   Modules accepted: Orders

## 2021-12-11 ENCOUNTER — Encounter: Payer: Self-pay | Admitting: Oncology

## 2021-12-12 ENCOUNTER — Inpatient Hospital Stay: Payer: HMO

## 2021-12-12 VITALS — BP 129/49 | HR 65 | Temp 98.3°F | Resp 20 | Ht 64.0 in | Wt 234.2 lb

## 2021-12-12 DIAGNOSIS — F411 Generalized anxiety disorder: Secondary | ICD-10-CM | POA: Diagnosis not present

## 2021-12-12 DIAGNOSIS — N184 Chronic kidney disease, stage 4 (severe): Secondary | ICD-10-CM | POA: Diagnosis not present

## 2021-12-12 DIAGNOSIS — D631 Anemia in chronic kidney disease: Secondary | ICD-10-CM

## 2021-12-12 MED ORDER — SODIUM CHLORIDE 0.9 % IV SOLN
200.0000 mg | Freq: Once | INTRAVENOUS | Status: AC
  Administered 2021-12-12: 200 mg via INTRAVENOUS
  Filled 2021-12-12: qty 200

## 2021-12-12 MED ORDER — ACETAMINOPHEN 325 MG PO TABS
650.0000 mg | ORAL_TABLET | Freq: Once | ORAL | Status: AC
  Administered 2021-12-12: 650 mg via ORAL
  Filled 2021-12-12: qty 2

## 2021-12-12 MED ORDER — SODIUM CHLORIDE 0.9 % IV SOLN: Freq: Once | INTRAVENOUS | Status: AC

## 2021-12-12 MED ORDER — FAMOTIDINE IN NACL 20-0.9 MG/50ML-% IV SOLN
20.0000 mg | Freq: Once | INTRAVENOUS | Status: AC
  Administered 2021-12-12: 20 mg via INTRAVENOUS
  Filled 2021-12-12: qty 50

## 2021-12-12 MED FILL — Iron Sucrose Inj 20 MG/ML (Fe Equiv): INTRAVENOUS | Qty: 10 | Status: AC

## 2021-12-12 NOTE — Patient Instructions (Signed)
Iron Deficiency Anemia, Adult Iron deficiency anemia is a condition in which the concentration of red blood cells or hemoglobin in the blood is below normal because of too little iron. Hemoglobin is a substance in red blood cells that carries oxygen to the body's tissues. When the concentration of red blood cells or hemoglobin is too low, not enough oxygen reaches these tissues. Iron deficiency anemia is usually long-lasting, and it develops over time. It may or may not cause symptoms. It is a common type of anemia. What are the causes? This condition may be caused by: Not enough iron in the diet. Abnormal absorption in the gut. Increased need for iron because of pregnancy or heavy menstrual periods, for females. Cancers of the gastrointestinal system, such as colon cancer. Blood loss caused by bleeding in the intestine. This may be from a gastrointestinal condition like Crohn's disease. Frequent blood draws, such as from blood donation. What increases the risk? The following factors may make you more likely to develop this condition: Being pregnant. Being a teenage girl going through a growth spurt. What are the signs or symptoms? Symptoms of this condition may include: Pale skin, lips, and nail beds. Weakness, dizziness, and getting tired easily. Headache. Shortness of breath when moving or exercising. Cold hands and feet. Fast or irregular heartbeat. Irritability or rapid breathing. These are more common in severe anemia. Mild anemia may not cause any symptoms. How is this diagnosed? This condition is diagnosed based on: Your medical history. A physical exam. Blood tests. You may have additional tests to find the underlying cause of your anemia, such as: Testing for blood in the stool (fecal occult blood test). A procedure to see inside your colon and rectum (colonoscopy). A procedure to see inside your esophagus and stomach (endoscopy). A test in which cells are removed from  bone marrow (bone marrow aspiration) or fluid is removed from the bone marrow to be examined. This is rarely needed. How is this treated? This condition is treated by correcting the cause of your iron deficiency. Treatment may involve: Adding iron-rich foods to your diet. Taking iron supplements. If you are pregnant or breastfeeding, you may need to take extra iron because your normal diet usually does not provide the amount of iron that you need. Increasing vitamin C intake. Vitamin C helps your body absorb iron. Your health care provider may recommend that you take iron supplements along with a glass of orange juice or a vitamin C supplement. Medicines to make heavy menstrual flow lighter. Surgery. You may need repeat blood tests to determine whether treatment is working. If the treatment does not seem to be working, you may need more tests. Follow these instructions at home: Medicines Take over-the-counter and prescription medicines only as told by your health care provider. This includes iron supplements and vitamins. For the best iron absorption, you should take iron supplements when your stomach is empty. If you cannot tolerate them on an empty stomach, you may need to take them with food. Do not drink milk or take antacids at the same time as your iron supplements. Milk and antacids may interfere with iron absorption. Iron supplements may turn stool (feces) a darker color and it may appear black. If you cannot tolerate taking iron supplements by mouth, talk with your health care provider about taking them through an IV or through an injection into a muscle. Eating and drinking  Talk with your health care provider before changing your diet. He or she may recommend   that you eat foods that contain a lot of iron, such as: Liver. Low-fat (lean) beef. Breads and cereals that have iron added to them (are fortified). Eggs. Dried fruit. Dark green, leafy vegetables. To help your body use the  iron from iron-rich foods, eat those foods at the same time as fresh fruits and vegetables that are high in vitamin C. Foods that are high in vitamin C include: Oranges. Peppers. Tomatoes. Mangoes. Drink enough fluid to keep your urine pale yellow. Managing constipation If you are taking an iron supplement, it may cause constipation. To prevent or treat constipation, you may need to: Take over-the-counter or prescription medicines. Eat foods that are high in fiber, such as beans, whole grains, and fresh fruits and vegetables. Limit foods that are high in fat and processed sugars, such as fried or sweet foods. General instructions Return to your normal activities as told by your health care provider. Ask your health care provider what activities are safe for you. Practice good hygiene. Anemia can make you more prone to illness and infection. Keep all follow-up visits as told by your health care provider. This is important. Contact a health care provider if you: Feel nauseous or you vomit. Feel weak. Have unexplained sweating. Develop symptoms of constipation, such as: Having fewer than three bowel movements a week. Straining to have a bowel movement. Having stools that are hard, dry, or larger than normal. Feeling full or bloated. Pain in the lower abdomen. Not feeling relief after having a bowel movement. Get help right away if you: Faint. If this happens, do not drive yourself to the hospital. Have chest pain. Have shortness of breath that: Is severe. Gets worse with physical activity. Have an irregular or rapid heartbeat. Become light-headed when getting up from a sitting or lying down position. These symptoms may represent a serious problem that is an emergency. Do not wait to see if the symptoms will go away. Get medical help right away. Call your local emergency services (911 in the [place] Do not drive yourself to the hospital. Summary Iron deficiency anemia is a condition in  which the concentration of red blood cells or hemoglobin in the blood is below normal because of too little iron. This condition is treated by correcting the cause of your iron deficiency. Take over-the-counter and prescription medicines only as told by your health care provider. This includes iron supplements and vitamins. To help your body use the iron from iron-rich foods, eat those foods at the same time as fresh fruits and vegetables that are high in vitamin C. Get help right away if you have shortness of breath that gets worse with physical activity. This information is not intended to replace advice given to you by your health care provider. Make sure you discuss any questions you have with your health care provider. Document Revised: 03/10/2019 Document Reviewed: 03/10/2019 Elsevier Patient Education  2022 Elsevier Inc. Iron-Rich Diet  Iron is a mineral that helps your body produce hemoglobin. Hemoglobin is a protein in red blood cells that carries oxygen to your body's tissues. Eating too little iron may cause you to feel weak and tired, and it can increase your risk of infection. Iron is naturally found in many foods, and many foods have iron added to them (are iron-fortified). You may need to follow an iron-rich diet if you do not have enough iron in your body due to certain medical conditions. The amount of iron that you need each day depends on your age,   your sex, and any medical conditions you have. Follow instructions from your health care provider or a dietitian about how much iron you should eat each day. What are tips for following this plan? Reading food labels Check food labels to see how many milligrams (mg) of iron are in each serving. Cooking Cook foods in pots and pans that are made from iron. Take these steps to make it easier for your body to absorb iron from certain foods: Soak beans overnight before cooking. Soak whole grains overnight and drain them before  using. Ferment flours before baking, such as by using yeast in bread dough. Meal planning When you eat foods that contain iron, you should eat them with foods that are high in vitamin C. These include oranges, peppers, tomatoes, potatoes, and mangoes. Vitamin C helps your body absorb iron. Certain foods and drinks prevent your body from absorbing iron properly. Avoid eating these foods in the same meal as iron-rich foods or with iron supplements. These foods include: Coffee, black tea, and red wine. Milk, dairy products, and foods that are high in calcium. Beans and soybeans. Whole grains. General information Take iron supplements only as told by your health care provider. An overdose of iron can be life-threatening. If you were prescribed iron supplements, take them with orange juice or a vitamin C supplement. When you eat iron-fortified foods or take an iron supplement, you should also eat foods that naturally contain iron, such as meat, poultry, and fish. Eating naturally iron-rich foods helps your body absorb the iron that is added to other foods or contained in a supplement. Iron from animal sources is better absorbed than iron from plant sources. What foods should I eat? Fruits Prunes. Raisins. Eat fruits high in vitamin C, such as oranges, grapefruits, and strawberries, with iron-rich foods. Vegetables Spinach (cooked). Green peas. Broccoli. Fermented vegetables. Eat vegetables high in vitamin C, such as leafy greens, potatoes, bell peppers, and tomatoes, with iron-rich foods. Grains Iron-fortified breakfast cereal. Iron-fortified whole-wheat bread. Enriched rice. Sprouted grains. Meats and other proteins Beef liver. Beef. Turkey. Chicken. Oysters. Shrimp. Tuna. Sardines. Chickpeas. Nuts. Tofu. Pumpkin seeds. Beverages Tomato juice. Fresh orange juice. Prune juice. Hibiscus tea. Iron-fortified instant breakfast shakes. Sweets and desserts Blackstrap molasses. Seasonings and  condiments Tahini. Fermented soy sauce. Other foods Wheat germ. The items listed above may not be a complete list of recommended foods and beverages. Contact a dietitian for more information. What foods should I limit? These are foods that should be limited while eating iron-rich foods as they can reduce the absorption of iron in your body. Grains Whole grains. Bran cereal. Bran flour. Meats and other proteins Soybeans. Products made from soy protein. Black beans. Lentils. Mung beans. Split peas. Dairy Milk. Cream. Cheese. Yogurt. Cottage cheese. Beverages Coffee. Black tea. Red wine. Sweets and desserts Cocoa. Chocolate. Ice cream. Seasonings and condiments Basil. Oregano. Large amounts of parsley. The items listed above may not be a complete list of foods and beverages you should limit. Contact a dietitian for more information. Summary Iron is a mineral that helps your body produce hemoglobin. Hemoglobin is a protein in red blood cells that carries oxygen to your body's tissues. Iron is naturally found in many foods, and many foods have iron added to them (are iron-fortified). When you eat foods that contain iron, you should eat them with foods that are high in vitamin C. Vitamin C helps your body absorb iron. Certain foods and drinks prevent your body from absorbing iron properly,   such as whole grains and dairy products. You should avoid eating these foods in the same meal as iron-rich foods or with iron supplements. This information is not intended to replace advice given to you by your health care provider. Make sure you discuss any questions you have with your health care provider. Document Revised: 06/13/2020 Document Reviewed: 06/13/2020 Elsevier Patient Education  2023 Elsevier Inc.  

## 2021-12-13 DIAGNOSIS — G4733 Obstructive sleep apnea (adult) (pediatric): Secondary | ICD-10-CM | POA: Diagnosis not present

## 2021-12-13 MED FILL — Iron Sucrose Inj 20 MG/ML (Fe Equiv): INTRAVENOUS | Qty: 10 | Status: AC

## 2021-12-14 ENCOUNTER — Inpatient Hospital Stay: Payer: HMO | Attending: Hematology and Oncology

## 2021-12-14 DIAGNOSIS — N184 Chronic kidney disease, stage 4 (severe): Secondary | ICD-10-CM | POA: Diagnosis not present

## 2021-12-14 DIAGNOSIS — D631 Anemia in chronic kidney disease: Secondary | ICD-10-CM | POA: Diagnosis not present

## 2021-12-14 MED ORDER — SODIUM CHLORIDE 0.9 % IV SOLN
200.0000 mg | Freq: Once | INTRAVENOUS | Status: AC
  Administered 2021-12-14: 200 mg via INTRAVENOUS
  Filled 2021-12-14: qty 200

## 2021-12-14 MED ORDER — SODIUM CHLORIDE 0.9 % IV SOLN: Freq: Once | INTRAVENOUS | Status: AC

## 2021-12-14 MED ORDER — ACETAMINOPHEN 325 MG PO TABS
650.0000 mg | ORAL_TABLET | Freq: Once | ORAL | Status: AC
  Administered 2021-12-14: 650 mg via ORAL
  Filled 2021-12-14: qty 2

## 2021-12-14 MED ORDER — FAMOTIDINE IN NACL 20-0.9 MG/50ML-% IV SOLN
20.0000 mg | Freq: Once | INTRAVENOUS | Status: AC
  Administered 2021-12-14: 20 mg via INTRAVENOUS
  Filled 2021-12-14: qty 50

## 2021-12-14 NOTE — Patient Instructions (Signed)
Venofer ?Iron Sucrose Injection ?What is this medication? ?IRON SUCROSE (EYE ern SOO krose) treats low levels of iron (iron deficiency anemia) in people with kidney disease. Iron is a mineral that plays an important role in making red blood cells, which carry oxygen from your lungs to the rest of your body. ?This medicine may be used for other purposes; ask your health care provider or pharmacist if you have questions. ?COMMON BRAND NAME(S): Venofer ?What should I tell my care team before I take this medication? ?They need to know if you have any of these conditions: ?Anemia not caused by low iron levels ?Heart disease ?High levels of iron in the blood ?Kidney disease ?Liver disease ?An unusual or allergic reaction to iron, other medications, foods, dyes, or preservatives ?Pregnant or trying to get pregnant ?Breast-feeding ?How should I use this medication? ?This medication is for infusion into a vein. It is given in a hospital or clinic setting. ?Talk to your care team about the use of this medication in children. While this medication may be prescribed for children as young as 2 years for selected conditions, precautions do apply. ?Overdosage: If you think you have taken too much of this medicine contact a poison control center or emergency room at once. ?NOTE: This medicine is only for you. Do not share this medicine with others. ?What if I miss a dose? ?It is important not to miss your dose. Call your care team if you are unable to keep an appointment. ?What may interact with this medication? ?Do not take this medication with any of the following: ?Deferoxamine ?Dimercaprol ?Other iron products ?This medication may also interact with the following: ?Chloramphenicol ?Deferasirox ?This list may not describe all possible interactions. Give your health care provider a list of all the medicines, herbs, non-prescription drugs, or dietary supplements you use. Also tell them if you smoke, drink alcohol, or use illegal  drugs. Some items may interact with your medicine. ?What should I watch for while using this medication? ?Visit your care team regularly. Tell your care team if your symptoms do not start to get better or if they get worse. You may need blood work done while you are taking this medication. ?You may need to follow a special diet. Talk to your care team. Foods that contain iron include: whole grains/cereals, dried fruits, beans, or peas, leafy green vegetables, and organ meats (liver, kidney). ?What side effects may I notice from receiving this medication? ?Side effects that you should report to your care team as soon as possible: ?Allergic reactions--skin rash, itching, hives, swelling of the face, lips, tongue, or throat ?Low blood pressure--dizziness, feeling faint or lightheaded, blurry vision ?Shortness of breath ?Side effects that usually do not require medical attention (report to your care team if they continue or are bothersome): ?Flushing ?Headache ?Joint pain ?Muscle pain ?Nausea ?Pain, redness, or irritation at injection site ?This list may not describe all possible side effects. Call your doctor for medical advice about side effects. You may report side effects to FDA at 1-800-FDA-1088. ?Where should I keep my medication? ?This medication is given in a hospital or clinic and will not be stored at home. ?NOTE: This sheet is a summary. It may not cover all possible information. If you have questions about this medicine, talk to your doctor, pharmacist, or health care provider. ?? 2023 Elsevier/Gold Standard (2020-11-25 00:00:00) ? ?

## 2021-12-15 MED FILL — Iron Sucrose Inj 20 MG/ML (Fe Equiv): INTRAVENOUS | Qty: 10 | Status: AC

## 2021-12-18 ENCOUNTER — Inpatient Hospital Stay: Payer: HMO

## 2021-12-18 VITALS — BP 143/65 | HR 70 | Temp 97.9°F | Resp 18 | Wt 232.2 lb

## 2021-12-18 DIAGNOSIS — D631 Anemia in chronic kidney disease: Secondary | ICD-10-CM

## 2021-12-18 DIAGNOSIS — N184 Chronic kidney disease, stage 4 (severe): Secondary | ICD-10-CM | POA: Diagnosis not present

## 2021-12-18 MED ORDER — SODIUM CHLORIDE 0.9 % IV SOLN
200.0000 mg | Freq: Once | INTRAVENOUS | Status: AC
  Administered 2021-12-18: 200 mg via INTRAVENOUS
  Filled 2021-12-18: qty 200

## 2021-12-18 MED ORDER — FAMOTIDINE IN NACL 20-0.9 MG/50ML-% IV SOLN
20.0000 mg | Freq: Once | INTRAVENOUS | Status: AC
  Administered 2021-12-18: 20 mg via INTRAVENOUS

## 2021-12-18 MED ORDER — SODIUM CHLORIDE 0.9 % IV SOLN: Freq: Once | INTRAVENOUS | Status: AC

## 2021-12-18 MED ORDER — ACETAMINOPHEN 325 MG PO TABS
650.0000 mg | ORAL_TABLET | Freq: Once | ORAL | Status: AC
  Administered 2021-12-18: 650 mg via ORAL

## 2021-12-18 NOTE — Patient Instructions (Signed)

## 2021-12-20 MED FILL — Iron Sucrose Inj 20 MG/ML (Fe Equiv): INTRAVENOUS | Qty: 10 | Status: AC

## 2021-12-21 ENCOUNTER — Ambulatory Visit (INDEPENDENT_AMBULATORY_CARE_PROVIDER_SITE_OTHER): Payer: HMO

## 2021-12-21 ENCOUNTER — Other Ambulatory Visit: Payer: HMO

## 2021-12-21 ENCOUNTER — Inpatient Hospital Stay: Payer: HMO

## 2021-12-21 VITALS — BP 131/51 | HR 67 | Temp 98.1°F | Resp 18 | Ht 64.0 in | Wt 234.0 lb

## 2021-12-21 DIAGNOSIS — N189 Chronic kidney disease, unspecified: Secondary | ICD-10-CM

## 2021-12-21 DIAGNOSIS — I25118 Atherosclerotic heart disease of native coronary artery with other forms of angina pectoris: Secondary | ICD-10-CM

## 2021-12-21 DIAGNOSIS — E782 Mixed hyperlipidemia: Secondary | ICD-10-CM

## 2021-12-21 DIAGNOSIS — N184 Chronic kidney disease, stage 4 (severe): Secondary | ICD-10-CM | POA: Diagnosis not present

## 2021-12-21 DIAGNOSIS — F411 Generalized anxiety disorder: Secondary | ICD-10-CM

## 2021-12-21 MED ORDER — FAMOTIDINE IN NACL 20-0.9 MG/50ML-% IV SOLN
20.0000 mg | Freq: Once | INTRAVENOUS | Status: AC
  Administered 2021-12-21: 20 mg via INTRAVENOUS
  Filled 2021-12-21: qty 50

## 2021-12-21 MED ORDER — SODIUM CHLORIDE 0.9 % IV SOLN
200.0000 mg | Freq: Once | INTRAVENOUS | Status: AC
  Administered 2021-12-21: 200 mg via INTRAVENOUS
  Filled 2021-12-21: qty 200

## 2021-12-21 MED ORDER — ACETAMINOPHEN 325 MG PO TABS
650.0000 mg | ORAL_TABLET | Freq: Once | ORAL | Status: AC
  Administered 2021-12-21: 650 mg via ORAL
  Filled 2021-12-21: qty 2

## 2021-12-21 MED ORDER — SODIUM CHLORIDE 0.9 % IV SOLN: Freq: Once | INTRAVENOUS | Status: AC

## 2021-12-21 NOTE — Patient Instructions (Signed)
Visit Information   Goals Addressed   None    Patient Care Plan: CCM Pharmacy Care Plan     Problem Identified: chf, hld, dm   Priority: High  Onset Date: 11/17/2020     Long-Range Goal: Disease State Management   Start Date: 11/17/2020  Expected End Date: 11/17/2021  Recent Progress: On track  Priority: High  Note:     Current Barriers:  Unable to maintain control of diabetes  Pharmacist Clinical Goal(s):  Patient will maintain control of diabetes as evidenced by a1c and avoiding low blood sugar readings  through collaboration with PharmD and provider.   Interventions: 1:1 collaboration with [name], MD regarding development and update of comprehensive plan of care as evidenced by provider attestation and co-signature Inter-disciplinary care team collaboration (see longitudinal plan of care) Comprehensive medication review performed; medication list updated in electronic medical record  CAD: (LDL goal < 55) -Managed Daniel Bensimhon The ASCVD Risk score (Arnett DK, et al., 2019) failed to calculate for the following reasons:   The patient has a prior MI or stroke diagnosis Lab Results  Component Value Date   CHOL 120 10/10/2021   CHOL 145 07/11/2021   CHOL 175 10/17/2020   Lab Results  Component Value Date   HDL 49 10/10/2021   HDL 48 07/11/2021   HDL 43 10/17/2020   Lab Results  Component Value Date   LDLCALC 56 10/10/2021   LDLCALC 80 07/11/2021   LDLCALC 109 (H) 10/17/2020   Lab Results  Component Value Date   TRIG 76 10/10/2021   TRIG 92 07/11/2021   TRIG 130 10/17/2020   Lab Results  Component Value Date   CHOLHDL 2.4 10/10/2021   CHOLHDL 3.0 07/11/2021   CHOLHDL 4.1 10/17/2020  No results found for: "LDLDIRECT" -Not ideally controlled -Current treatment: Repatha sureclick every 2 weeks ([place] approved 07/27/21) Appropriate, Effective, Safe, Accessible Ezetimibe 59m Appropriate, Effective, Safe, Accessible Clopidogrel 764m(Dual  Therapy 12 months post DES on 02/03/21) Appropriate, Effective, Safe, Accessible ASA 8119mDual Therapy 12 months post DES on 02/03/21) Appropriate, Effective, Safe, Accessible -Medications previously tried: pravastatin, ASA 80m6murrent dietary patterns: working on healthy diet with dietician through insurance -Current exercise habits: cardiac rehab. [facility]  -Educated on Cholesterol goals;  Importance of limiting foods high in cholesterol; Exercise goal of 150 minutes per week; -Counseled on diet and exercise extensively June 2023: Called patient back, asked if she was still taking ASA since [facility] stated Cardio stopped ASA in March but she forgot they stopped it and has been taking ever since. She stated she'll stop immediately  Diabetes (A1c goal <7%) Lab Results  Component Value Date   HGBA1C 6.6 (H) 04/25/2021   HGBA1C 6.8 (H) 09/30/2020   HGBA1C 6.1 (H) 08/30/2020   Lab Results  Component Value Date   LDLCALC 56 10/10/2021   CREATININE 2.0 (A) 12/07/2021   Lab Results  Component Value Date   NA 138 12/07/2021   K 4.3 12/07/2021   CREATININE 2.0 (A) 12/07/2021   EGFR 24 (L) 10/26/2021   GFRNONAA 26 (L) 09/25/2021   GLUCOSE 116 (H) 10/26/2021   Lab Results  Component Value Date   WBC 9.8 12/07/2021   HGB 9.1 (A) 12/07/2021   HCT 29 (A) 12/07/2021   MCV 83 10/10/2021   PLT 255 12/07/2021  -Controlled -Current medications: Levemir 50 units daily Appropriate, Effective, Safe, Accessible Novolog 2-10 units  tid before meals Appropriate, Effective, Safe, Accessible -Medications previously tried:  farxiga -Current home glucose readings fasting glucose:  May 2022: 99-105  November 2022: 05/25/21: 24 December 2021: 12/21/21: 92 fasting, 118 after eating post prandial glucose: well controlled only low is when she gets busy and eats too late after her meal-time insulin -Reports  hypoglycemic/hyperglycemic symptoms - 2 times in the past few weeks when she got busy and didn't eat as quickly -Current meal patterns:  breakfast:  Watching diet and improving choices for heart healthy, lower sodium and lower blood sugar lunch:  not overly hungry at lunch time but does try to eat  dinner: avoiding fried foods, more fish (2-3 times weekly), baked chicken, utilizing portion control, not eating pizza since heart attack snacks: 1/2 peanut butter sandwich, has cut back  drinks:  has cut out diet coke -Current exercise: cardiac rehab -Educated on A1c and blood sugar goals; Exercise goal of 150 minutes per week; Prevention and management of hypoglycemic episodes; Carbohydrate counting and/or plate method -Counseled to check feet daily and get yearly eye exams -Counseled on diet and exercise extensively June 2023: Counseled extensively on anemia and how it could mess with sugar readings. Recommended she bring in sugar logs to all appts  Heart Failure (Goal: manage symptoms and prevent exacerbations) -Managed Dr. [name] Chronic Diastolic HF - Echo 09/8880 EF 55-60%  - Echo (3/22): EF 55-60%, Grade II DD, normal RV - Echo (6/22): EF 60%, mod MR/TR (per chart review at [facility]). - RHC (10/22): mild to moderate elevated filling pressures, large v-waves PCWP tracing - TEE (10/22): EF 55-60%, basal inferior akinesis, severe central MR. - Recent admit 10/22 with a/c CHF, enrolled in FASTR trial and diuresed 15 lbs. Severe MR likely contributing to decompensation.  - Echo (11/22): EF 50-55%, severe LV HK, moderate MR - Stable NYHA III. Has been doing well with Cardiac Rehab. BP Readings from Last 3 Encounters:  12/21/21 (!) 131/51  12/18/21 (!) 143/65  12/12/21 (!) 129/49  -Controlled -Current treatment: Torsemide 20 mg 2 tablets daily Appropriate, Effective, Safe, Accessible Carvedilol 12.5 mg bid Appropriate, Effective, Safe, Accessible Hydralazine 25 mg tid Appropriate,  Effective, Safe, Accessible Isosorbide mn 30 mg daily Appropriate, Effective, Safe, Accessible -Weight: (Weighs herself everyday) Normal: 232 + 2 pounds 05/25/21: 232 BP: (Tests daily) 05/25/21: 132/57 07/27/21: Didn't have reading today -Medications previously tried:  Metolazone, spironolactone,  -Current dietary habits:  watching sodium  -Current exercise habits:  cardiac rehab currently  -Educated on Benefits of medications for managing symptoms and prolonging life Importance of blood pressure control -Counseled on diet and exercise extensively Recommended to continue current medication. Wearing support stockings every day.    Patient Goals/Self-Care Activities Patient will:  - take medications as prescribed focus on medication adherence by using pill box  check glucose 3-4 times daily, document, and provide at future appointments target a minimum of 150 minutes of moderate intensity exercise weekly engage in dietary modifications by limiting carbohydrates, sodium and fatty foods  Follow Up Plan: Telephone follow up appointment with care management team member scheduled for: December 2023  [place] Constable, Sherian Rein.D. - [phone]      Ms. Bevacqua was given information about Chronic Care Management services today including:  CCM service includes personalized support from designated clinical staff supervised by her physician, including individualized plan of care and coordination with other care providers 24/7 contact phone numbers for assistance for urgent and routine care needs. Standard insurance, coinsurance, copays and deductibles apply for chronic care  management only during months in which we provide at least 20 minutes of these services. Most insurances cover these services at 100%, however patients may be responsible for any copay, coinsurance and/or deductible if applicable. This service may help you avoid the need for more expensive face-to-face services. Only one  practitioner may furnish and bill the service in a calendar month. The patient may stop CCM services at any time (effective at the end of the month) by phone call to the office staff.  Patient agreed to services and verbal consent obtained.   The patient verbalized understanding of instructions, educational materials, and care plan provided today and DECLINED offer to receive copy of patient instructions, educational materials, and care plan.  The pharmacy team will reach out to the patient again over the next 60 days.   Lane Hacker, [place]

## 2021-12-21 NOTE — Patient Instructions (Signed)
Iron Deficiency Anemia, Adult Iron deficiency anemia is a condition in which the concentration of red blood cells or hemoglobin in the blood is below normal because of too little iron. Hemoglobin is a substance in red blood cells that carries oxygen to the body's tissues. When the concentration of red blood cells or hemoglobin is too low, not enough oxygen reaches these tissues. Iron deficiency anemia is usually long-lasting, and it develops over time. It may or may not cause symptoms. It is a common type of anemia. What are the causes? This condition may be caused by: Not enough iron in the diet. Abnormal absorption in the gut. Increased need for iron because of pregnancy or heavy menstrual periods, for females. Cancers of the gastrointestinal system, such as colon cancer. Blood loss caused by bleeding in the intestine. This may be from a gastrointestinal condition like Crohn's disease. Frequent blood draws, such as from blood donation. What increases the risk? The following factors may make you more likely to develop this condition: Being pregnant. Being a teenage girl going through a growth spurt. What are the signs or symptoms? Symptoms of this condition may include: Pale skin, lips, and nail beds. Weakness, dizziness, and getting tired easily. Headache. Shortness of breath when moving or exercising. Cold hands and feet. Fast or irregular heartbeat. Irritability or rapid breathing. These are more common in severe anemia. Mild anemia may not cause any symptoms. How is this diagnosed? This condition is diagnosed based on: Your medical history. A physical exam. Blood tests. You may have additional tests to find the underlying cause of your anemia, such as: Testing for blood in the stool (fecal occult blood test). A procedure to see inside your colon and rectum (colonoscopy). A procedure to see inside your esophagus and stomach (endoscopy). A test in which cells are removed from  bone marrow (bone marrow aspiration) or fluid is removed from the bone marrow to be examined. This is rarely needed. How is this treated? This condition is treated by correcting the cause of your iron deficiency. Treatment may involve: Adding iron-rich foods to your diet. Taking iron supplements. If you are pregnant or breastfeeding, you may need to take extra iron because your normal diet usually does not provide the amount of iron that you need. Increasing vitamin C intake. Vitamin C helps your body absorb iron. Your health care provider may recommend that you take iron supplements along with a glass of orange juice or a vitamin C supplement. Medicines to make heavy menstrual flow lighter. Surgery. You may need repeat blood tests to determine whether treatment is working. If the treatment does not seem to be working, you may need more tests. Follow these instructions at home: Medicines Take over-the-counter and prescription medicines only as told by your health care provider. This includes iron supplements and vitamins. For the best iron absorption, you should take iron supplements when your stomach is empty. If you cannot tolerate them on an empty stomach, you may need to take them with food. Do not drink milk or take antacids at the same time as your iron supplements. Milk and antacids may interfere with iron absorption. Iron supplements may turn stool (feces) a darker color and it may appear black. If you cannot tolerate taking iron supplements by mouth, talk with your health care provider about taking them through an IV or through an injection into a muscle. Eating and drinking  Talk with your health care provider before changing your diet. He or she may recommend   that you eat foods that contain a lot of iron, such as: Liver. Low-fat (lean) beef. Breads and cereals that have iron added to them (are fortified). Eggs. Dried fruit. Dark green, leafy vegetables. To help your body use the  iron from iron-rich foods, eat those foods at the same time as fresh fruits and vegetables that are high in vitamin C. Foods that are high in vitamin C include: Oranges. Peppers. Tomatoes. Mangoes. Drink enough fluid to keep your urine pale yellow. Managing constipation If you are taking an iron supplement, it may cause constipation. To prevent or treat constipation, you may need to: Take over-the-counter or prescription medicines. Eat foods that are high in fiber, such as beans, whole grains, and fresh fruits and vegetables. Limit foods that are high in fat and processed sugars, such as fried or sweet foods. General instructions Return to your normal activities as told by your health care provider. Ask your health care provider what activities are safe for you. Practice good hygiene. Anemia can make you more prone to illness and infection. Keep all follow-up visits as told by your health care provider. This is important. Contact a health care provider if you: Feel nauseous or you vomit. Feel weak. Have unexplained sweating. Develop symptoms of constipation, such as: Having fewer than three bowel movements a week. Straining to have a bowel movement. Having stools that are hard, dry, or larger than normal. Feeling full or bloated. Pain in the lower abdomen. Not feeling relief after having a bowel movement. Get help right away if you: Faint. If this happens, do not drive yourself to the hospital. Have chest pain. Have shortness of breath that: Is severe. Gets worse with physical activity. Have an irregular or rapid heartbeat. Become light-headed when getting up from a sitting or lying down position. These symptoms may represent a serious problem that is an emergency. Do not wait to see if the symptoms will go away. Get medical help right away. Call your local emergency services (911 in the [place] Do not drive yourself to the hospital. Summary Iron deficiency anemia is a condition in  which the concentration of red blood cells or hemoglobin in the blood is below normal because of too little iron. This condition is treated by correcting the cause of your iron deficiency. Take over-the-counter and prescription medicines only as told by your health care provider. This includes iron supplements and vitamins. To help your body use the iron from iron-rich foods, eat those foods at the same time as fresh fruits and vegetables that are high in vitamin C. Get help right away if you have shortness of breath that gets worse with physical activity. This information is not intended to replace advice given to you by your health care provider. Make sure you discuss any questions you have with your health care provider. Document Revised: 03/10/2019 Document Reviewed: 03/10/2019 Elsevier Patient Education  2022 Elsevier Inc. Iron-Rich Diet  Iron is a mineral that helps your body produce hemoglobin. Hemoglobin is a protein in red blood cells that carries oxygen to your body's tissues. Eating too little iron may cause you to feel weak and tired, and it can increase your risk of infection. Iron is naturally found in many foods, and many foods have iron added to them (are iron-fortified). You may need to follow an iron-rich diet if you do not have enough iron in your body due to certain medical conditions. The amount of iron that you need each day depends on your age,   your sex, and any medical conditions you have. Follow instructions from your health care provider or a dietitian about how much iron you should eat each day. What are tips for following this plan? Reading food labels Check food labels to see how many milligrams (mg) of iron are in each serving. Cooking Cook foods in pots and pans that are made from iron. Take these steps to make it easier for your body to absorb iron from certain foods: Soak beans overnight before cooking. Soak whole grains overnight and drain them before  using. Ferment flours before baking, such as by using yeast in bread dough. Meal planning When you eat foods that contain iron, you should eat them with foods that are high in vitamin C. These include oranges, peppers, tomatoes, potatoes, and mangoes. Vitamin C helps your body absorb iron. Certain foods and drinks prevent your body from absorbing iron properly. Avoid eating these foods in the same meal as iron-rich foods or with iron supplements. These foods include: Coffee, black tea, and red wine. Milk, dairy products, and foods that are high in calcium. Beans and soybeans. Whole grains. General information Take iron supplements only as told by your health care provider. An overdose of iron can be life-threatening. If you were prescribed iron supplements, take them with orange juice or a vitamin C supplement. When you eat iron-fortified foods or take an iron supplement, you should also eat foods that naturally contain iron, such as meat, poultry, and fish. Eating naturally iron-rich foods helps your body absorb the iron that is added to other foods or contained in a supplement. Iron from animal sources is better absorbed than iron from plant sources. What foods should I eat? Fruits Prunes. Raisins. Eat fruits high in vitamin C, such as oranges, grapefruits, and strawberries, with iron-rich foods. Vegetables Spinach (cooked). Green peas. Broccoli. Fermented vegetables. Eat vegetables high in vitamin C, such as leafy greens, potatoes, bell peppers, and tomatoes, with iron-rich foods. Grains Iron-fortified breakfast cereal. Iron-fortified whole-wheat bread. Enriched rice. Sprouted grains. Meats and other proteins Beef liver. Beef. Turkey. Chicken. Oysters. Shrimp. Tuna. Sardines. Chickpeas. Nuts. Tofu. Pumpkin seeds. Beverages Tomato juice. Fresh orange juice. Prune juice. Hibiscus tea. Iron-fortified instant breakfast shakes. Sweets and desserts Blackstrap molasses. Seasonings and  condiments Tahini. Fermented soy sauce. Other foods Wheat germ. The items listed above may not be a complete list of recommended foods and beverages. Contact a dietitian for more information. What foods should I limit? These are foods that should be limited while eating iron-rich foods as they can reduce the absorption of iron in your body. Grains Whole grains. Bran cereal. Bran flour. Meats and other proteins Soybeans. Products made from soy protein. Black beans. Lentils. Mung beans. Split peas. Dairy Milk. Cream. Cheese. Yogurt. Cottage cheese. Beverages Coffee. Black tea. Red wine. Sweets and desserts Cocoa. Chocolate. Ice cream. Seasonings and condiments Basil. Oregano. Large amounts of parsley. The items listed above may not be a complete list of foods and beverages you should limit. Contact a dietitian for more information. Summary Iron is a mineral that helps your body produce hemoglobin. Hemoglobin is a protein in red blood cells that carries oxygen to your body's tissues. Iron is naturally found in many foods, and many foods have iron added to them (are iron-fortified). When you eat foods that contain iron, you should eat them with foods that are high in vitamin C. Vitamin C helps your body absorb iron. Certain foods and drinks prevent your body from absorbing iron properly,   such as whole grains and dairy products. You should avoid eating these foods in the same meal as iron-rich foods or with iron supplements. This information is not intended to replace advice given to you by your health care provider. Make sure you discuss any questions you have with your health care provider. Document Revised: 06/13/2020 Document Reviewed: 06/13/2020 Elsevier Patient Education  2023 Elsevier Inc.  

## 2021-12-21 NOTE — Progress Notes (Signed)
Chronic Care Management Pharmacy Note  12/21/2021 Name:  Dana [name] MRN:  [id number] DOB:  1961/07/01   Plan Updates:  Patient has been taking ASA despite being Dc'd. Counseled to stop DAPT per Cardio  Subjective: Dana [name] is an 61 y.o. year old female who is a primary patient of Cox, Kirsten, MD.  The CCM team was consulted for assistance with disease management and care coordination needs.    Engaged with patient by telephone for follow up visit in response to provider referral for pharmacy case management and/or care coordination services.   Consent to Services:  The patient was given the following information about Chronic Care Management services today, agreed to services, and gave verbal consent: 1. CCM service includes personalized support from designated clinical staff supervised by the primary care provider, including individualized plan of care and coordination with other care providers 2. 24/7 contact phone numbers for assistance for urgent and routine care needs. 3. Service will only be billed when office clinical staff spend 20 minutes or more in a month to coordinate care. 4. Only one practitioner may furnish and bill the service in a calendar month. 5.The patient may stop CCM services at any time (effective at the end of the month) by phone call to the office staff. 6. The patient will be responsible for cost sharing (co-pay) of up to 20% of the service fee (after annual deductible is met). Patient agreed to services and consent obtained.  Patient Care Team: [name], MD as PCP - General (Internal Medicine) Bensimhon, Shaune Pascal, MD as PCP - Cardiology (Cardiology) Kennith Gain, MD as Consulting Physician (Allergy) Bensimhon, Shaune Pascal, MD as Consulting Physician (Cardiology) Scherrie November, MD as Referring Physician (Gastroenterology) Mincey, Neena Rhymes, MD as Consulting Physician (Ophthalmology) Katheran James., MD as Consulting Physician  (Endocrinology) Sharyn Dross., DPM as Consulting Physician (Podiatry) Hennie Duos, MD as Consulting Physician (Rheumatology) Biagio Quint, MD as Referring Physician (Nephrology) Lane Hacker, [facility] as Pharmacist (Pharmacist) Lane Hacker, [facility] (Pharmacist) Early Osmond, MD as Consulting Physician (Cardiology) Derwood Kaplan, MD as Consulting Physician (Oncology) Charlaine Dalton, MD as Referring Physician  Recent office visits: 10/17/2020 - LDL is elevated.  109.  Goal should be less than 70 if not less than 55.  Please discussed with patient.  I also thought she might have been on Repatha or Praluent through the cardiologist.Kidney dysfunction is stable. Sugar is 54.Anemia is stable.  This is multifactorial. Labs are concerning for B12 deficiency.  I would recommend over-the-counter B12 sublingual 2500 mg once daily.  PTH is elevated which is secondary hyperparathyroidism secondary to kidney dysfunction 09/26/2020 - pursue catherization as kidneys improve. Keep appt with nephroloty. Recent consult visits: 10/25/2020 - cardiology - continue torsemide daily. Continue CPAP nightly.  10/19/2020 - Cardiology - good response to Repatha. Check LP(a) with next lipid panel. No medication changes.  10/13/2020 - nephrology - consider resuming RAAS blocker once creatinine stabilizes for protein in urine.  Hospital visits:   09/29/2020 - ED to hospital admission - ACS and AKI.   Objective:  Lab Results  Component Value Date   CREATININE 2.0 (A) 12/07/2021   BUN 61 (A) 12/07/2021   GFRNONAA 26 (L) 09/25/2021   GFRAA 43 (L) 06/28/2020   NA 138 12/07/2021   K 4.3 12/07/2021   CALCIUM 8.7 12/07/2021   CO2 26 (A) 12/07/2021   GLUCOSE 116 (H) 10/26/2021    Lab Results  Component Value  Date/Time   HGBA1C 6.6 (H) 04/25/2021 12:20 AM   HGBA1C 6.8 (H) 09/30/2020 02:41 AM    Last diabetic Eye exam:  Lab Results  Component Value Date/Time   HMDIABEYEEXA  Retinopathy (A) 06/06/2020 12:00 AM    Last diabetic Foot exam: No results found for: "HMDIABFOOTEX"   Lab Results  Component Value Date   CHOL 120 10/10/2021   HDL 49 10/10/2021   LDLCALC 56 10/10/2021   TRIG 76 10/10/2021   CHOLHDL 2.4 10/10/2021       Latest Ref Rng & Units 12/07/2021   12:00 AM 10/26/2021   11:41 AM 10/10/2021    8:32 AM  Hepatic Function  Total Protein 6.0 - 8.5 g/dL   7.2   Albumin 3.5 - 5.0 3.9     3.8  4.0   AST 13 - 35 27      18   ALT 7 - 35 U/L 16      17   Alk Phosphatase 25 - 125 142      149   Total Bilirubin 0.0 - 1.2 mg/dL   0.5      This result is from an external source.    Lab Results  Component Value Date/Time   TSH 4.014 04/24/2021 12:50 PM   TSH 3.590 03/23/2020 11:01 AM       Latest Ref Rng & Units 12/07/2021   12:00 AM 10/31/2021   12:00 AM 10/26/2021   12:00 AM  CBC  WBC  9.8     11.6     9.2      Hemoglobin 12.0 - 16.0 9.1     8.4     7.9      Hematocrit 36 - 46 29     26     25       Platelets 150 - 400 K/uL 255     283     258         This result is from an external source.    Lab Results  Component Value Date/Time   VD25OH 26.0 (L) 02/28/2021 02:09 PM    Clinical ASCVD: Yes  The ASCVD Risk score (Arnett DK, et al., 2019) failed to calculate for the following reasons:   The patient has a prior MI or stroke diagnosis       12/21/2021   10:29 AM 10/10/2021    7:41 AM 05/09/2021   10:57 AM  Depression screen PHQ 2/9  Decreased Interest 1 2 0  Down, Depressed, Hopeless 1 2 0  PHQ - 2 Score 2 4 0  Altered sleeping 0 1   Tired, decreased energy 3 3   Change in appetite 0 1   Feeling bad or failure about yourself  3 3   Trouble concentrating 0 1   Moving slowly or fidgety/restless 1 0   Suicidal thoughts 0 0   PHQ-9 Score 9 13       Social History   Tobacco Use  Smoking Status Former   Types: Cigarettes   Start date: 1983   Quit date: 1984   Years since quitting: 39.4  Smokeless Tobacco Never   BP  Readings from Last 3 Encounters:  12/21/21 (!) 131/51  12/18/21 (!) 143/65  12/12/21 (!) 129/49   Pulse Readings from Last 3 Encounters:  12/21/21 67  12/18/21 70  12/12/21 65   Wt Readings from Last 3 Encounters:  12/21/21 234 lb (106.1 kg)  12/18/21 232 lb 3.2 oz (105.3 kg)  12/12/21 234 lb 4 oz (106.3 kg)   BMI Readings from Last 3 Encounters:  12/21/21 40.17 kg/m  12/18/21 39.86 kg/m  12/12/21 40.21 kg/m    Assessment/Interventions: Review of patient past medical history, allergies, medications, health status, including review of consultants reports, laboratory and other test data, was performed as part of comprehensive evaluation and provision of chronic care management services.   SDOH:  (Social Determinants of Health) assessments and interventions performed: Yes SDOH Interventions    Flowsheet Row Most Recent Value  SDOH Interventions   Financial Strain Interventions Intervention Not Indicated  Transportation Interventions Intervention Not Indicated      SDOH Screenings   Alcohol Screen: Low Risk  (05/09/2021)   Alcohol Screen    Last Alcohol Screening Score (AUDIT): 0  Depression (PHQ2-9): Medium Risk (12/21/2021)   Depression (PHQ2-9)    PHQ-2 Score: 9  Financial Resource Strain: Low Risk  (12/21/2021)   Overall Financial Resource Strain (CARDIA)    Difficulty of Paying Living Expenses: Not hard at all  Food Insecurity: No Food Insecurity (04/27/2021)   Hunger Vital Sign    Worried About Running Out of Food in the Last Year: Never true    Ran Out of Food in the Last Year: Never true  Housing: Low Risk  (04/27/2021)   Housing    Last Housing Risk Score: 0  Physical Activity: Insufficiently Active (02/15/2020)   Exercise Vital Sign    Days of Exercise per Week: 3 days    Minutes of Exercise per Session: 40 min  Social Connections: Not on file  Stress: Not on file  Tobacco Use: Medium Risk (12/07/2021)   Patient History    Smoking Tobacco Use: Former     Smokeless Tobacco Use: Never    Passive Exposure: Not on file  Transportation Needs: No Transportation Needs (12/21/2021)   PRAPARE - Hydrologist (Medical): No    Lack of Transportation (Non-Medical): No    CCM Care Plan  Allergies  Allergen Reactions   Naproxen Hives and Rash   Celecoxib Other (See Comments)    Stomach pain   Aspirin Other (See Comments)    CAN NOT TAKE DUE TO ULCERS (documented previously) but takes every day currently (as of 09/29/20) without reported issues Can take coated aspirin   Glucosamine Hives, Swelling and Other (See Comments)    Angioedema    Iron     IV iron transfusion - hives - Feb 2022 hospitalization   Metoclopramide Other (See Comments)    Facial twitching and stuttering   Metolazone Other (See Comments)    Acute renal failure   Other Hives    Strawberry allergy   Shellfish-Derived Products Hives, Swelling and Other (See Comments)    Angioedema    Statins Other (See Comments)    Muscle pain   Strawberry (Diagnostic)    Strawberry Extract    Strawberry Flavor    Strawberry Leaves Extract    Tramadol Nausea And Vomiting         Medications Reviewed Today     Reviewed by Emmit Alexanders, RN (Registered Nurse) on 12/21/21 at 1349  Med List Status: <None>   Medication Order Taking? Sig Documenting Provider Last Dose Status Informant  allopurinol (ZYLOPRIM) 100 MG tablet [id number] No Take 1 tablet by mouth daily. [provider] Taking Active   amoxicillin (AMOXIL) 500 MG capsule [id number] No TAKE FOUR CAPSULES BY MOUTH ONE HOUR BEFORE DENTAL PROCEDURES AND CLEANINGS [provider]  Taking Active   busPIRone (BUSPAR) 5 MG tablet [id number] No TAKE ONE TABLET BY MOUTH THREE TIMES DAILY Cox, Kirsten, MD Taking Active   carvedilol (COREG) 12.5 MG tablet [id number] No Take 1 tablet (12.5 mg total) by mouth 2 (two) times daily. [place], Maricela Bo, FNP Taking Active   Cholecalciferol (VITAMIN D) 50  MCG (2000 UT) tablet [id number] No Take 2,000 Units by mouth daily. Take one daily [provider] Taking Active   clopidogrel (PLAVIX) 75 MG tablet [id number] No Take 1 tablet by mouth daily. [provider] Taking Active   Cyanocobalamin 3000 MCG CAPS [id number] No Take 2 capsules by mouth daily. Patient purchased 3000 mcg gummies and taking 2 gummies daily [provider] Taking Active   EPINEPHrine 0.3 mg/0.3 mL IJ SOAJ injection [id number] No Use as directed for life-threatening allergic reaction. Kennith Gain, MD Taking Active   Evolocumab [facility] SURECLICK) 197 MG/ML Darden Palmer [id number] No Inject 1 Dose into the skin every 14 (fourteen) days. Pixie Casino, MD Taking Active   ezetimibe (ZETIA) 10 MG tablet [id number] No Take 1 tablet (10 mg total) by mouth daily. Early Osmond, MD Taking Active   ferrous sulfate 325 (65 FE) MG tablet [id number] No Take 1 tablet by mouth daily. [provider] Taking Active   hydrALAZINE (APRESOLINE) 25 MG tablet [id number]  Take 0.5 tablets (12.5 mg total) by mouth 3 (three) times daily. [place], [place], [place]  Active   HYDROcodone-acetaminophen (NORCO) 7.5-325 MG tablet [id number]  TAKE ONE TABLET BY MOUTH three times daily AS NEEDED FOR moderate pain Cox, Kirsten, MD  Active   insulin aspart (NOVOLOG FLEXPEN) 100 UNIT/ML FlexPen [id number] No 2-10 U before meals based on SSI. Cox, Kirsten, MD Taking Active   Insulin Pen Needle (BD PEN NEEDLE NANO 2ND GEN) 32G X 4 MM MISC [id number] No Inject 1 each into the skin in the morning, at noon, in the evening, and at bedtime. Cox, Kirsten, MD Taking Active   iron sucrose (VENOFER) 200 mg in sodium chloride 0.9 % 100 mL IVPB [id number]   Derwood Kaplan, MD  Active   isosorbide mononitrate (IMDUR) 30 MG 24 hr tablet [id number] No Take 1 tablet by mouth daily. [provider] Taking Active   lactulose (CHRONULAC) 10 GM/15ML solution [id number] No Take 15 mLs (10 g  total) by mouth daily as needed for mild constipation. Cox, Kirsten, MD Taking Active   latanoprost (XALATAN) 0.005 % ophthalmic solution [id number] No Place 1 drop into both eyes at bedtime. [provider] Taking Active   LEVEMIR FLEXTOUCH 100 UNIT/ML FlexTouch Pen [id number] No INJECT 50 UNITS into THE SKIN DAILY Cox, Kirsten, MD Taking Active   LORazepam (ATIVAN) 0.5 MG tablet [id number] No TAKE ONE TABLET BY MOUTH DAILY AS NEEDED FOR ANXIETY Lillard Anes, MD Taking Active   magnesium oxide (MAG-OX) 400 MG tablet [id number] No Take 2 tablets (800 mg total) by mouth daily. Take 2 (400 mg) daily=800 mg Shirley Friar, PA-C Taking Active   nitroGLYCERIN (NITROSTAT) 0.4 MG SL tablet [id number] No Place 1 tablet (0.4 mg total) under the tongue every 5 (five) minutes as needed for chest pain. Eileen Stanford, PA-C Taking Active   Omega-3 Fatty Acids (FISH OIL PO) [id number] No Take 1 capsule by mouth daily. [provider] Taking Active   OXYGEN [id number] No Inhale 2 L into the lungs at bedtime. [provider] Taking Active   pantoprazole (PROTONIX) 40 MG tablet [id number] No Take  1 tablet (40 mg total) by mouth daily. Cox, Kirsten, MD Taking Active   potassium chloride SA (KLOR-CON M) 20 MEQ tablet [id number] No Take 1 tablet by mouth daily. [provider] Taking Active   Probiotic CAPS [id number] No Take 1 capsule by mouth daily. [provider] Taking Active   promethazine (PHENERGAN) 25 MG tablet [id number] No Take 1 tablet (25 mg total) by mouth as needed. Lillard Anes, MD Taking Active   torsemide [facility]) 20 MG tablet [id number] No Take 4 tablets (80 mg total) by mouth daily. [place], Maricela Bo, FNP Taking Active   Med List Note [place], [place] T, [place] 03/04/18 1416): CPAP            Patient Active Problem List   Diagnosis Date Noted   Genetic testing 11/07/2021   Family history of breast cancer 10/23/2021   GAD  (generalized anxiety disorder) 07/11/2021   Mixed hyperlipidemia 07/11/2021   Severe mitral regurgitation 05/18/2021   S/P mitral valve clip implantation 05/18/2021   Anemia due to stage 4 chronic kidney disease ([place]) 04/04/2021   Cognitive communication deficit 02/13/2021   History of cardiac arrest 02/06/2021   Right hand paresthesia 01/25/2021   Bilateral lower extremity edema 01/23/2021   Anoxic brain injury ([place]) 01/20/2021   Impaired mobility and ADLs 01/20/2021   Left-sided weakness 01/20/2021   Memory deficits 01/20/2021   Statin myopathy 01/03/2021   Secondary hyperparathyroidism of renal origin ([place]) 11/18/2020   Vitamin D deficiency 11/18/2020   Proteinuria 10/13/2020   Stage 3b chronic kidney disease ([place]) 10/13/2020   Chest pain 09/29/2020   Contrast dye induced nephropathy, possible 09/03/2020   OSA on CPAP 09/03/2020   Need for COVID-19 vaccine 06/28/2020   Medicare annual wellness visit, subsequent 06/13/2020   Chronic respiratory failure with hypoxia ([place]) 03/23/2020   Chronic gout without tophus 03/23/2020   Pain in both hands 03/54/6568   Uncomplicated opioid dependence ([place]) 03/23/2020   Depression, major, recurrent, mild ([place]) 12/21/2019   Hypomagnesemia 12/21/2019   Diarrhea 12/21/2019   Lumbar back pain 10/05/2019   Diabetic nephropathy associated with type 2 diabetes mellitus ([place]) 10/05/2019   Hypertensive heart and renal disease with congestive heart failure ([place]) 09/18/2019   Demand ischemia (HCC)    Diastolic heart failure ([place]) 11/07/2017   Class 2 severe obesity due to excess calories with serious comorbidity and body mass index (BMI) of 39.0 to 39.9 in adult ([place]) 11/07/2017   Coronary artery disease of native artery of native heart with stable angina pectoris ([name]) 12/25/2016   Hyperlipidemia LDL goal <70 12/14/2016   Anemia 03/23/2016   Chronic pain syndrome 03/23/2016   COPD (chronic obstructive pulmonary disease) ([place]) 03/23/2016   GERD  without esophagitis 03/23/2016   On home oxygen therapy 03/23/2016   S/P ablation of atrial fibrillation 10/04/2015   Diabetic polyneuropathy associated with type 2 diabetes mellitus ([place]) 04/02/2014    Immunization History  Administered Date(s) Administered   Influenza Inj Mdck Quad Pf 03/23/2020, 04/04/2021   Influenza, Quadrivalent, Recombinant, Inj, Pf 05/21/2018   Influenza,inj,Quad PF,6+ Mos 03/28/2015   Influenza-Unspecified 04/15/2014, 05/16/2016, 03/12/2019   Moderna Covid-19 Vaccine Bivalent Booster 57yr & up 05/09/2021   Moderna SARS-COV2 Booster Vaccination 06/28/2020   Pneumococcal Conjugate-13 05/16/2014, 06/14/2014   Pneumococcal Polysaccharide-23 05/18/2018    Conditions to be addressed/monitored:  Hyperlipidemia, Diabetes, Heart Failure and Coronary Artery Disease  Care Plan : [place] Updates made by KLane Hacker RSaddle Rock Estatessince 12/21/2021 12:00 AM  Problem: chf, hld, dm   Priority: High  Onset Date: 11/17/2020     Long-Range Goal: Disease State Management   Start Date: 11/17/2020  Expected End Date: 11/17/2021  Recent Progress: On track  Priority: High  Note:     Current Barriers:  Unable to maintain control of diabetes  Pharmacist Clinical Goal(s):  Patient will maintain control of diabetes as evidenced by a1c and avoiding low blood sugar readings  through collaboration with PharmD and provider.   Interventions: 1:1 collaboration with [name], MD regarding development and update of comprehensive plan of care as evidenced by provider attestation and co-signature Inter-disciplinary care team collaboration (see longitudinal plan of care) Comprehensive medication review performed; medication list updated in electronic medical record  CAD: (LDL goal < 55) -Managed Daniel Bensimhon The ASCVD Risk score (Arnett DK, et al., 2019) failed to calculate for the following reasons:   The patient has a prior MI or stroke diagnosis Lab Results   Component Value Date   CHOL 120 10/10/2021   CHOL 145 07/11/2021   CHOL 175 10/17/2020   Lab Results  Component Value Date   HDL 49 10/10/2021   HDL 48 07/11/2021   HDL 43 10/17/2020   Lab Results  Component Value Date   LDLCALC 56 10/10/2021   LDLCALC 80 07/11/2021   LDLCALC 109 (H) 10/17/2020   Lab Results  Component Value Date   TRIG 76 10/10/2021   TRIG 92 07/11/2021   TRIG 130 10/17/2020   Lab Results  Component Value Date   CHOLHDL 2.4 10/10/2021   CHOLHDL 3.0 07/11/2021   CHOLHDL 4.1 10/17/2020  No results found for: "LDLDIRECT" -Not ideally controlled -Current treatment: Repatha sureclick every 2 weeks ([place] approved 07/27/21) Appropriate, Effective, Safe, Accessible Ezetimibe 82m Appropriate, Effective, Safe, Accessible Clopidogrel 753m(Dual Therapy 12 months post DES on 02/03/21) Appropriate, Effective, Safe, Accessible ASA 8162mDual Therapy 12 months post DES on 02/03/21) Appropriate, Effective, Safe, Accessible -Medications previously tried: pravastatin, ASA 75m38murrent dietary patterns: working on healthy diet with dietician through insurance -Current exercise habits: cardiac rehab. [facility].  -Educated on Cholesterol goals;  Importance of limiting foods high in cholesterol; Exercise goal of 150 minutes per week; -Counseled on diet and exercise extensively June 2023: Called patient back, asked if she was still taking ASA since [facility] stated Cardio stopped ASA in March but she forgot they stopped it and has been taking ever since. She stated she'll stop immediately  Diabetes (A1c goal <7%) Lab Results  Component Value Date   HGBA1C 6.6 (H) 04/25/2021   HGBA1C 6.8 (H) 09/30/2020   HGBA1C 6.1 (H) 08/30/2020   Lab Results  Component Value Date   LDLCALC 56 10/10/2021   CREATININE 2.0 (A) 12/07/2021   Lab Results  Component Value Date   NA 138 12/07/2021    K 4.3 12/07/2021   CREATININE 2.0 (A) 12/07/2021   EGFR 24 (L) 10/26/2021   GFRNONAA 26 (L) 09/25/2021   GLUCOSE 116 (H) 10/26/2021   Lab Results  Component Value Date   WBC 9.8 12/07/2021   HGB 9.1 (A) 12/07/2021   HCT 29 (A) 12/07/2021   MCV 83 10/10/2021   PLT 255 12/07/2021  -Controlled -Current medications: Levemir 50 units daily Appropriate, Effective, Safe, Accessible Novolog 2-10 units tid before meals Appropriate, Effective, Safe, Accessible -Medications previously tried:  farxiga -Current home glucose readings fasting glucose:  May 2022: 99-105  November 2022: 05/25/21: 24 December 2021: 12/21/21: 92 fasting, 118 after eating post prandial glucose: well controlled only low is when she gets busy and eats too late after her meal-time insulin -Reports hypoglycemic/hyperglycemic symptoms - 2 times in the past few weeks when she got busy and didn't eat as quickly -Current meal patterns:  breakfast:  Watching diet and improving choices for heart healthy, lower sodium and lower blood sugar lunch:  not overly hungry at lunch time but does try to eat  dinner: avoiding fried foods, more fish (2-3 times weekly), baked chicken, utilizing portion control, not eating pizza since heart attack snacks: 1/2 peanut butter sandwich, has cut back  drinks:  has cut out diet coke -Current exercise: cardiac rehab -Educated on A1c and blood sugar goals; Exercise goal of 150 minutes per week; Prevention and management of hypoglycemic episodes; Carbohydrate counting and/or plate method -Counseled to check feet daily and get yearly eye exams -Counseled on diet and exercise extensively June 2023: Counseled extensively on anemia and how it could mess with sugar readings. Recommended she bring in sugar logs to all appts  Heart Failure (Goal: manage symptoms and prevent exacerbations) -Managed Dr. [name] Chronic Diastolic HF - Echo 03/3266 EF 55-60%  - Echo (3/22): EF 55-60%, Grade II DD,  normal RV - Echo (6/22): EF 60%, mod MR/TR (per chart review at [facility]). - RHC (10/22): mild to moderate elevated filling pressures, large v-waves PCWP tracing - TEE (10/22): EF 55-60%, basal inferior akinesis, severe central MR. - Recent admit 10/22 with a/c CHF, enrolled in FASTR trial and diuresed 15 lbs. Severe MR likely contributing to decompensation.  - Echo (11/22): EF 50-55%, severe LV HK, moderate MR - Stable NYHA III. Has been doing well with Cardiac Rehab. BP Readings from Last 3 Encounters:  12/21/21 (!) 131/51  12/18/21 (!) 143/65  12/12/21 (!) 129/49  -Controlled -Current treatment: Torsemide 20 mg 2 tablets daily Appropriate, Effective, Safe, Accessible Carvedilol 12.5 mg bid Appropriate, Effective, Safe, Accessible Hydralazine 25 mg tid Appropriate, Effective, Safe, Accessible Isosorbide mn 30 mg daily Appropriate, Effective, Safe, Accessible -Weight: (Weighs herself everyday) Normal: 232 + 2 pounds 05/25/21: 232 BP: (Tests daily) 05/25/21: 132/57 07/27/21: Didn't have reading today -Medications previously tried:  Metolazone, spironolactone,  -Current dietary habits:  watching sodium  -Current exercise habits:  cardiac rehab currently  -Educated on Benefits of medications for managing symptoms and prolonging life Importance of blood pressure control -Counseled on diet and exercise extensively Recommended to continue current medication. Wearing support stockings every day.    Patient Goals/Self-Care Activities Patient will:  - take medications as prescribed focus on medication adherence by using pill box  check glucose 3-4 times daily, document, and provide at future appointments target a minimum of 150 minutes of moderate intensity exercise weekly engage in dietary modifications by limiting carbohydrates, sodium and fatty foods  Follow Up Plan: Telephone follow up appointment with care management team member scheduled for: December 2023  [place] Constable,  Sherian Rein.D. - [phone]       Medication Assistance: Patient has grant to cover cost of [place].   Patient's preferred pharmacy is:  [facility] - [place], [address] [place] [address] Phone: [phone] Fax: [phone]  [facility] - [place], [place] - [address] [place] [address] Phone: [phone] Fax: [phone]   Uses pill box? Yes Pt endorses good compliance  We discussed: Current pharmacy is preferred with insurance plan and patient is satisfied with pharmacy services Patient decided to: Utilize [facility] for medication synchronization, packaging and delivery Verbal consent obtained for [facility] enhanced pharmacy services (medication synchronization, adherence packaging, delivery coordination). A medication sync plan was created to allow patient to get all medications delivered once every 30 to 90 days per patient preference. Patient understands they have freedom to choose pharmacy and clinical pharmacist will coordinate care between all prescribers and [facility] Pharmacy.    Care Plan and Follow Up Patient Decision:  Patient agrees to Care Plan and Follow-up.  Plan: Telephone follow up appointment with care management team member scheduled for:  December 2023    [place] Constable, [place].D. - [phone]

## 2021-12-22 ENCOUNTER — Ambulatory Visit: Payer: HMO

## 2021-12-22 MED FILL — Iron Sucrose Inj 20 MG/ML (Fe Equiv): INTRAVENOUS | Qty: 10 | Status: AC

## 2021-12-25 ENCOUNTER — Other Ambulatory Visit: Payer: Self-pay | Admitting: Family Medicine

## 2021-12-25 ENCOUNTER — Inpatient Hospital Stay: Payer: HMO

## 2021-12-25 ENCOUNTER — Other Ambulatory Visit: Payer: Self-pay

## 2021-12-25 VITALS — BP 146/59 | HR 70 | Temp 98.0°F | Resp 18 | Ht 64.0 in | Wt 234.0 lb

## 2021-12-25 DIAGNOSIS — N184 Chronic kidney disease, stage 4 (severe): Secondary | ICD-10-CM | POA: Diagnosis not present

## 2021-12-25 DIAGNOSIS — D631 Anemia in chronic kidney disease: Secondary | ICD-10-CM

## 2021-12-25 LAB — BASIC METABOLIC PANEL
BUN: 65 — AB (ref 4–21)
CO2: 24 — AB (ref 13–22)
Chloride: 102 (ref 99–108)
Creatinine: 2 — AB (ref 0.5–1.1)
Glucose: 249
Potassium: 4.3 mEq/L (ref 3.5–5.1)
Sodium: 138 (ref 137–147)

## 2021-12-25 LAB — CBC AND DIFFERENTIAL
HCT: 26 — AB (ref 36–46)
Hemoglobin: 8.4 — AB (ref 12.0–16.0)
Neutrophils Absolute: 7.99
Platelets: 225 10*3/uL (ref 150–400)
WBC: 10.8

## 2021-12-25 LAB — HEPATIC FUNCTION PANEL
ALT: 20 U/L (ref 7–35)
AST: 25 (ref 13–35)
Alkaline Phosphatase: 129 — AB (ref 25–125)
Bilirubin, Total: 0.6

## 2021-12-25 LAB — COMPREHENSIVE METABOLIC PANEL
Albumin: 3.8 (ref 3.5–5.0)
Calcium: 8.2 — AB (ref 8.7–10.7)

## 2021-12-25 LAB — CBC: RBC: 3.2 — AB (ref 3.87–5.11)

## 2021-12-25 MED ORDER — SODIUM CHLORIDE 0.9 % IV SOLN: Freq: Once | INTRAVENOUS | Status: AC

## 2021-12-25 MED ORDER — FAMOTIDINE IN NACL 20-0.9 MG/50ML-% IV SOLN
20.0000 mg | Freq: Once | INTRAVENOUS | Status: AC
  Administered 2021-12-25: 20 mg via INTRAVENOUS
  Filled 2021-12-25: qty 50

## 2021-12-25 MED ORDER — ACETAMINOPHEN 325 MG PO TABS
650.0000 mg | ORAL_TABLET | Freq: Once | ORAL | Status: AC
  Administered 2021-12-25: 650 mg via ORAL
  Filled 2021-12-25: qty 2

## 2021-12-25 MED ORDER — SODIUM CHLORIDE 0.9 % IV SOLN
200.0000 mg | Freq: Once | INTRAVENOUS | Status: AC
  Administered 2021-12-25: 200 mg via INTRAVENOUS
  Filled 2021-12-25: qty 200

## 2021-12-25 NOTE — Patient Instructions (Signed)

## 2021-12-26 ENCOUNTER — Ambulatory Visit: Payer: HMO

## 2021-12-26 DIAGNOSIS — F411 Generalized anxiety disorder: Secondary | ICD-10-CM | POA: Diagnosis not present

## 2021-12-28 ENCOUNTER — Other Ambulatory Visit: Payer: HMO

## 2021-12-29 ENCOUNTER — Ambulatory Visit: Payer: HMO

## 2022-01-01 ENCOUNTER — Encounter: Payer: Self-pay | Admitting: Family Medicine

## 2022-01-01 ENCOUNTER — Other Ambulatory Visit: Payer: Self-pay | Admitting: Family Medicine

## 2022-01-01 ENCOUNTER — Encounter: Payer: Self-pay | Admitting: Oncology

## 2022-01-01 ENCOUNTER — Telehealth: Payer: Self-pay

## 2022-01-01 NOTE — Chronic Care Management (AMB) (Signed)
01-01-2022: Received message form upstream that patient called in requesting Lorazepam and hydrocodone with a delivery scheduled to go out today. Hydrocodone is in need of refills.  Left VM with Dr. Alyse Low nurse with request and sent staff message.  [facility] Pharmacist Assistant [phone]

## 2022-01-02 ENCOUNTER — Other Ambulatory Visit: Payer: Self-pay

## 2022-01-02 DIAGNOSIS — N184 Chronic kidney disease, stage 4 (severe): Secondary | ICD-10-CM

## 2022-01-03 ENCOUNTER — Other Ambulatory Visit: Payer: Self-pay | Admitting: Oncology

## 2022-01-03 DIAGNOSIS — D631 Anemia in chronic kidney disease: Secondary | ICD-10-CM

## 2022-01-03 DIAGNOSIS — F411 Generalized anxiety disorder: Secondary | ICD-10-CM | POA: Diagnosis not present

## 2022-01-03 NOTE — Progress Notes (Signed)
[facility]  [address] [place],  [place]  [address] [phone]  Clinic Day:  12/07/21  Referring physician: [name], MD  eASSESSMENT & PLAN:   Chronic anemia that has been worsening.  This is anemia of chronic kidney disease/anemia of chronic disease.  Repeat iron studies in March did not reveal iron deficiency, soluble transferrin receptor was normal. No hemolysis was found.  Serum protein electrophoresis is negative for a monoclonal spike.  I therefore recommended that she increase the Retacrit to every 2 weeks. Stool Hemoccults were negative.  Her hemoglobin has come up from 8.4 to 9.2.  2.   Iron deficiency.  This was found at her last visit and she has been given a series of IV iron.  This hemoglobin is the best that it has been.  3.   Strong family history of breast cancer.  I referred her to the genetic counselor.   I increased her Retacrit to 20,000 units every 2 weeks and she seems to be responding to that.  Her iron studies show deficiency and so she has been given IV iron.  We will check her monthly with labs and she already has that scheduled.  She understands and agrees with this plan of care.  I provided 20 minutes of face-to-face time during this this encounter and > 50% was spent counseling as documented under my assessment and plan.    Derwood Kaplan, MD [facility] [address] [place] [address] Dept: [phone] Dept Fax: [phone]    CHIEF COMPLAINT:  CC: Iron deficiency  Current Treatment:  Observation   HISTORY OF PRESENT ILLNESS:  Dana [name] is a 61 y.o. female with  a complicated medical history, including stage IV chronic kidney disease, who is referred in consultation by Dr. Biagio Quint, nephrology, for assessment and management of iron deficiency.  She had worsening anemia, so was given a dose of Retacrit in December.  She  then was then found to have a low iron saturation of 11% with a TIBC of 271 and ferritin 106 on February 10th, and gives a history of allergic reaction to IV iron, so was referred here.  She states she broke out in severe hives after receiving IV iron in the past 1 to 2 years.  She states she also has had blood transfusion on several occasions, most recent being in December 2022.  She has a history of hypoxic brain injury in June 2022, so has difficulty with dates. She reports severe fatigue as well as dyspnea with limited exertion.  She denies overt form of blood loss.  She is on clopidogrel. Review of her records reveal she received 1 unit of packed cells in November 2022 when her hemoglobin was 7.2.  She was given IV Feraheme in February 2022 when admitted for acute on chronic diastolic heart failure.  She reported severe generalized itching after receiving the first dose of Feraheme.  No hives were noted by the examining provider.  The 2nd dose of Feraheme was not given.  She was transfused in February 2022 when her hemoglobin was 7.8 and again in March 2022 when her hemoglobin was 7.   In 2017, when she reported rectal bleeding, as well as black tarry stools, EGD and colonoscopy were recommended.  EGD in September 2017 revealed a pyloric erosion.  I was unable to access the colonoscopy report from September 2017. She had EGD in September  2014 that was normal.  She was diagnosed with gastroparesis in September 2014.  It was noted in her chart that she had a colonoscopy in April 2014 which was normal, but I could not find a report.  She was also transfused in April 2013 at [facility] when her hemoglobin was 7.4.  She has had chronic anemia since 2010.   Her family history is significant in that her mother had breast cancer twice and ended up with bilateral mastectomies.  She also has 2 maternal aunts that had breast cancer.  I will therefore refer her to the genetic counselor for their opinion regarding  testing.  She does not know if her mother has been tested.  INTERVAL HISTORY:  I have reviewed her chart and materials related to her extensively and collaborated history with the patient. Summary of oncologic history is as follows: Oncology History   No history exists.  Dana [name] is here for routine follow up of anemia of chronic disease, associated with chronic kidney disease stage IV.  Her hemoglobin was down to 8.4 last time and so we changed her Retacrit injections to every 2 weeks.  She feels fair but still has severe fatigue.  She also has some anxiety and occasional depression.  We did check her iron studies in May and they were abnormal so she was given a series of IV iron and finished that last week.  She has chronic low back pain at a 9 out of 10.  She does describe dyspnea with her congestive heart failure and her torsemide dose is increased to 80 mg daily.  Her hemoglobin is up from 8.4 to 9.2 today.  Her MCV is low normal at 83. Her latest renal function last week revealed a BUN of 61 with a creatinine of 2.32.  Hopefully she is responding to the increase in the Retacrit injections.  Her  appetite is good, and she has lost 1 pound since her last visit.  She denies fever, chills or other signs of infection.  She denies nausea, vomiting, bowel issues, or abdominal pain.  She denies sore throat, cough, dyspnea, or chest pain.  HISTORY:   Past Medical History:  Diagnosis Date  . Acquired absence of left great toe ([facility])   . Acquired absence of other left toe(s) ([place])   . ACS (acute coronary syndrome) ([place])   . Anemia   . Anoxic brain injury ([facility])   . Anxiety   . CAD (coronary artery disease)    DES mLAD 04/07/15; DES dRCA & DES pPDA 08/30/16; DES mCX & PTCA OM1 09/29/20  . Chronic diastolic (congestive) heart failure ([place])   . Chronic pain   . Chronic systolic (congestive) heart failure ([facility])   . CKD (chronic kidney disease)   . Contrast dye induced nephropathy, possible 09/03/2020  . COPD  (chronic obstructive pulmonary disease) ([facility])   . Diabetes ([facility])   . Diastolic CHF ([place])   . Dyspnea   . Family history of breast cancer 10/23/2021  . Gastro-esophageal reflux disease without esophagitis   . Hyperlipidemia   . Hypertension   . Hypertensive heart disease with heart failure ([place])   . Hypoxemia   . Idiopathic gout, multiple sites   . Localized edema   . Low back pain   . Mitral regurgitation   . Mitral regurgitation   . Mixed hyperlipidemia   . Mixed incontinence   . Morbid (severe) obesity due to excess calories ([place])   . Neuromuscular disorder ([place])   .  Neuropathy   . Non-ST elevation (NSTEMI) myocardial infarction ([place])    08/29/20, 09/29/20  . OSA (obstructive sleep apnea) 09/03/2020  . Other specified hypothyroidism   . PAF (paroxysmal atrial fibrillation) (H[facility])    s/p pulmonary vein isolation by cryoablation 10/04/15 Dr. Minna Merritts in [facility]  . PEA (Pulseless electrical activity) ([place] [facility])    ~ 01/13/21 at [facility] during admission DKA, volume overload, possible CAP, hypoxia s/p ACLS with ROSC ~ 10-15  . Pneumonia   . Primary insomnia   . Rheumatoid arthritis ([place])   . Stroke ([place])   . Thyroid disease   . Type 2 diabetes mellitus with diabetic nephropathy ([place])   . Vitamin D deficiency     Past Surgical History:  Procedure Laterality Date  . ABDOMINAL HYSTERECTOMY     Partial- Still has both ovaries  . CARDIOVASCULAR STRESS TEST  08/13/2014   Nuclear; Normal  . CARPAL TUNNEL RELEASE    . CATARACT EXTRACTION, BILATERAL    . CESAREAN SECTION    . CHOLECYSTECTOMY    . CORONARY ANGIOPLASTY WITH STENT PLACEMENT     3 blockages/ 1 stent  . CORONARY STENT INTERVENTION N/A 09/29/2020   Procedure: CORONARY STENT INTERVENTION;  Surgeon: Sherren Mocha, MD;  Location: [facility];  Service: Cardiovascular;  Laterality: N/A;  CFX  . EYE SURGERY    . LEFT HEART CATH AND CORONARY ANGIOGRAPHY N/A 09/29/2020   Procedure: LEFT HEART CATH AND CORONARY  ANGIOGRAPHY;  Surgeon: Sherren Mocha, MD;  Location: [facility];  Service: Cardiovascular;  Laterality: N/A;  . MITRAL VALVE REPAIR N/A 05/18/2021   Procedure: MITRAL VALVE REPAIR;  Surgeon: Early Osmond, MD;  Location: [facility];  Service: Cardiovascular;  Laterality: N/A;  . RIGHT HEART CATH N/A 04/27/2021   Procedure: RIGHT HEART CATH;  Surgeon: Jolaine Artist, MD;  Location: [facility];  Service: Cardiovascular;  Laterality: N/A;  . RIGHT/LEFT HEART CATH AND CORONARY ANGIOGRAPHY N/A 01/07/2017   Procedure: Right/Left Heart Cath and Coronary Angiography;  Surgeon: Larey Dresser, MD;  Location: [facility];  Service: Cardiovascular;  Laterality: N/A;  . ROTATOR CUFF REPAIR    . TEE WITHOUT CARDIOVERSION N/A 04/28/2021   Procedure: TRANSESOPHAGEAL ECHOCARDIOGRAM (TEE);  Surgeon: Jolaine Artist, MD;  Location: [facility] ENDOSCOPY;  Service: Cardiovascular;  Laterality: N/A;  . TEE WITHOUT CARDIOVERSION N/A 05/18/2021   Procedure: TRANSESOPHAGEAL ECHOCARDIOGRAM (TEE);  Surgeon: Early Osmond, MD;  Location: [facility];  Service: Cardiovascular;  Laterality: N/A;  . TOE AMPUTATION Left   . US ECHOCARDIOGRAPHY  05/2017   Normal    Family History  Problem Relation Age of Onset  . Breast cancer Mother 35       contralateral breast ca dx 43  . Coronary artery disease Mother   . Hypertension Mother   . Hyperlipidemia Mother   . Diabetes type II Mother   . Lung cancer Mother 17  . Diabetes Father   . Hypertension Father   . Mental illness Sister   . Bipolar disorder Other   . Depression Other   . Schizophrenia Other   . Cancer Other        MGF's sisters x2; unknown type    Social History:  reports that she quit smoking about 39 years ago. Her smoking use included cigarettes. She started smoking about 40 years ago. She has never used smokeless tobacco. She reports that she does not drink alcohol and does  not use drugs.The patient is alone  today.  Allergies:  Allergies  Allergen Reactions  . Naproxen Hives and Rash  . Celecoxib Other (See Comments)    Stomach pain  . Aspirin Other (See Comments)    CAN NOT TAKE DUE TO ULCERS (documented previously) but takes every day currently (as of 09/29/20) without reported issues Can take coated aspirin  . Glucosamine Hives, Swelling and Other (See Comments)    Angioedema   . Iron     IV iron transfusion - hives - Feb 2022 hospitalization  . Metoclopramide Other (See Comments)    Facial twitching and stuttering  . Metolazone Other (See Comments)    Acute renal failure  . Other Hives    Strawberry allergy  . Shellfish-Derived Products Hives, Swelling and Other (See Comments)    Angioedema   . Statins Other (See Comments)    Muscle pain  . Strawberry (Diagnostic)   . Strawberry Extract   . Strawberry Flavor   . Strawberry Leaves Extract   . Tramadol Nausea And Vomiting         Current Medications: Current Outpatient Medications  Medication Sig Dispense Refill  . allopurinol (ZYLOPRIM) 100 MG tablet Take 1 tablet by mouth daily.    Marland Kitchen amoxicillin (AMOXIL) 500 MG capsule TAKE FOUR CAPSULES BY MOUTH ONE HOUR BEFORE DENTAL PROCEDURES AND CLEANINGS    . busPIRone (BUSPAR) 5 MG tablet TAKE ONE TABLET BY MOUTH THREE TIMES DAILY 270 tablet 1  . carvedilol (COREG) 12.5 MG tablet Take 1 tablet (12.5 mg total) by mouth 2 (two) times daily. 60 tablet 11  . Cholecalciferol (VITAMIN D) 50 MCG (2000 UT) tablet Take 2,000 Units by mouth daily. Take one daily    . clopidogrel (PLAVIX) 75 MG tablet Take 1 tablet by mouth daily.    . Cyanocobalamin 3000 MCG CAPS Take 2 capsules by mouth daily. Patient purchased 3000 mcg gummies and taking 2 gummies daily    . EPINEPHrine 0.3 mg/0.3 mL IJ SOAJ injection Use as directed for life-threatening allergic reaction. 4 Device 3  . Evolocumab (REPATHA SURECLICK) 676 MG/ML SOAJ Inject 1 Dose into the skin every 14 (fourteen) days. 6 mL 31  .  ezetimibe (ZETIA) 10 MG tablet Take 1 tablet (10 mg total) by mouth daily. 90 tablet 3  . ferrous sulfate 325 (65 FE) MG tablet Take 1 tablet by mouth daily.    . hydrALAZINE (APRESOLINE) 25 MG tablet Take 0.5 tablets (12.5 mg total) by mouth 3 (three) times daily. 45 tablet 3  . HYDROcodone-acetaminophen (NORCO) 7.5-325 MG tablet TAKE ONE TABLET BY MOUTH three times daily AS NEEDED FOR moderate pain 90 tablet 0  . insulin aspart (NOVOLOG FLEXPEN) 100 UNIT/ML FlexPen 2-10 U before meals based on SSI. 15 mL 3  . Insulin Pen Needle (BD PEN NEEDLE NANO 2ND GEN) 32G X 4 MM MISC Inject 1 each into the skin in the morning, at noon, in the evening, and at bedtime. 600 each 3  . isosorbide mononitrate (IMDUR) 30 MG 24 hr tablet Take 1 tablet by mouth daily.    Marland Kitchen lactulose (CHRONULAC) 10 GM/15ML solution Take 15 mLs (10 g total) by mouth daily as needed for mild constipation. 236 mL 1  . latanoprost (XALATAN) 0.005 % ophthalmic solution Place 1 drop into both eyes at bedtime.    Marland Kitchen LEVEMIR FLEXTOUCH 100 UNIT/ML FlexTouch Pen INJECT 50 UNITS into THE SKIN DAILY 15 mL 1  . LORazepam (ATIVAN) 0.5 MG tablet TAKE ONE TABLET  BY MOUTH DAILY AS NEEDED FOR ANXIETY 30 tablet 2  . magnesium oxide (MAG-OX) 400 MG tablet Take 2 tablets (800 mg total) by mouth daily. Take 2 (400 mg) daily=800 mg 60 tablet 1  . nitroGLYCERIN (NITROSTAT) 0.4 MG SL tablet Place 1 tablet (0.4 mg total) under the tongue every 5 (five) minutes as needed for chest pain. 25 tablet 3  . Omega-3 Fatty Acids (FISH OIL PO) Take 1 capsule by mouth daily.    . OXYGEN Inhale 2 L into the lungs at bedtime.    . pantoprazole (PROTONIX) 40 MG tablet Take 1 tablet (40 mg total) by mouth daily. 90 tablet 1  . potassium chloride SA (KLOR-CON M) 20 MEQ tablet Take 1 tablet by mouth daily.    . Probiotic CAPS Take 1 capsule by mouth daily.    . promethazine (PHENERGAN) 25 MG tablet Take 1 tablet (25 mg total) by mouth as needed. 30 tablet 3  . torsemide  (DEMADEX) 20 MG tablet Take 4 tablets (80 mg total) by mouth daily. 130 tablet 5   No current facility-administered medications for this visit.    REVIEW OF SYSTEMS:  Review of Systems  Constitutional:  Positive for fatigue.  HENT:  Negative.    Eyes: Negative.   Respiratory:  Positive for shortness of breath.   Cardiovascular:  Positive for leg swelling.  Gastrointestinal: Negative.   Endocrine: Negative.   Genitourinary: Negative.    Musculoskeletal:  Positive for back pain.     VITALS:  There were no vitals taken for this visit.  Wt Readings from Last 3 Encounters:  12/25/21 234 lb (106.1 kg)  12/21/21 234 lb (106.1 kg)  12/18/21 232 lb 3.2 oz (105.3 kg)    There is no height or weight on file to calculate BMI.  Performance status (ECOG): 1 - Symptomatic but completely ambulatory  PHYSICAL EXAM:  Physical Exam Constitutional:      Appearance: Normal appearance.  HENT:     Head: Normocephalic and atraumatic.     Nose: Nose normal.     Mouth/Throat:     Pharynx: Oropharynx is clear.  Eyes:     Extraocular Movements: Extraocular movements intact.     Conjunctiva/sclera: Conjunctivae normal.     Pupils: Pupils are equal, round, and reactive to light.  Cardiovascular:     Rate and Rhythm: Normal rate and regular rhythm.     Heart sounds: Normal heart sounds.  Pulmonary:     Breath sounds: Normal breath sounds.  Abdominal:     General: Bowel sounds are normal.     Palpations: Abdomen is soft.  Musculoskeletal:        General: Normal range of motion.     Cervical back: Normal range of motion.  Skin:    General: Skin is warm.  Neurological:     General: No focal deficit present.     Mental Status: She is alert and oriented to person, place, and time.  Psychiatric:        Mood and Affect: Mood normal.        Behavior: Behavior normal.        Thought Content: Thought content normal.        Judgment: Judgment normal.   LABS:      Latest Ref Rng & Units  12/25/2021   12:00 AM 12/07/2021   12:00 AM 10/31/2021   12:00 AM  CBC  WBC  10.8     9.8     11.6  Hemoglobin 12.0 - 16.0 8.4     9.1     8.4      Hematocrit 36 - 46 '26     29     26      '$ Platelets 150 - 400 K/uL 225     255     283         This result is from an external source.       Latest Ref Rng & Units 12/25/2021   12:00 AM 12/07/2021   12:00 AM 10/26/2021   11:41 AM  CMP  Glucose 70 - 99 mg/dL   116   BUN 4 - 21 65     61     70   Creatinine 0.5 - 1.1 2.0     2.0     2.34   Sodium 137 - 147 138     138     143   Potassium 3.5 - 5.1 mEq/L 4.3     4.3     4.5   Chloride 99 - 108 102     99     102   CO2 13 - '22 24     26     24   '$ Calcium 8.7 - 10.7 8.2     8.7     9.3   Alkaline Phos 25 - 125 129     142       AST 13 - 35 25     27       ALT 7 - 35 U/L 20     16          This result is from an external source.     Lab Results  Component Value Date   TOTALPROTELP 7.3 09/13/2021   ALBUMINELP 3.5 09/13/2021   A1GS 0.3 09/13/2021   A2GS 0.8 09/13/2021   BETS 1.0 09/13/2021   GAMS 1.8 09/13/2021   MSPIKE Not Observed 09/13/2021   Lab Results  Component Value Date   TIBC 333 12/07/2021   TIBC 328 09/13/2021   TIBC 271 08/25/2021   FERRITIN 34 12/07/2021   FERRITIN 64 09/13/2021   FERRITIN 106 08/25/2021   IRONPCTSAT 12 12/07/2021   IRONPCTSAT 12 09/13/2021   IRONPCTSAT 11 (L) 08/25/2021   Lab Results  Component Value Date   LDH 176 09/13/2021    STUDIES:  No results found.

## 2022-01-04 ENCOUNTER — Other Ambulatory Visit: Payer: HMO

## 2022-01-04 ENCOUNTER — Telehealth: Payer: Self-pay | Admitting: Oncology

## 2022-01-04 ENCOUNTER — Other Ambulatory Visit: Payer: Self-pay | Admitting: Oncology

## 2022-01-04 ENCOUNTER — Encounter: Payer: Self-pay | Admitting: Oncology

## 2022-01-04 ENCOUNTER — Inpatient Hospital Stay: Payer: HMO

## 2022-01-04 ENCOUNTER — Inpatient Hospital Stay: Payer: HMO | Admitting: Oncology

## 2022-01-04 ENCOUNTER — Other Ambulatory Visit: Payer: Self-pay

## 2022-01-04 VITALS — BP 193/78 | HR 68 | Temp 98.0°F | Resp 16 | Ht 64.0 in | Wt 230.7 lb

## 2022-01-04 VITALS — BP 154/54 | HR 75 | Temp 98.1°F | Resp 18 | Ht 64.0 in | Wt 230.7 lb

## 2022-01-04 DIAGNOSIS — D631 Anemia in chronic kidney disease: Secondary | ICD-10-CM

## 2022-01-04 DIAGNOSIS — N184 Chronic kidney disease, stage 4 (severe): Secondary | ICD-10-CM | POA: Diagnosis not present

## 2022-01-04 DIAGNOSIS — E782 Mixed hyperlipidemia: Secondary | ICD-10-CM | POA: Diagnosis not present

## 2022-01-04 DIAGNOSIS — I130 Hypertensive heart and chronic kidney disease with heart failure and stage 1 through stage 4 chronic kidney disease, or unspecified chronic kidney disease: Secondary | ICD-10-CM | POA: Diagnosis not present

## 2022-01-04 DIAGNOSIS — N189 Chronic kidney disease, unspecified: Secondary | ICD-10-CM

## 2022-01-04 DIAGNOSIS — D649 Anemia, unspecified: Secondary | ICD-10-CM | POA: Diagnosis not present

## 2022-01-04 LAB — CBC AND DIFFERENTIAL
HCT: 29 — AB (ref 36–46)
Hemoglobin: 9.2 — AB (ref 12.0–16.0)
Neutrophils Absolute: 7.34
Platelets: 241 10*3/uL (ref 150–400)
WBC: 10.2

## 2022-01-04 LAB — CBC: RBC: 3.5 — AB (ref 3.87–5.11)

## 2022-01-04 MED ORDER — EPOETIN ALFA-EPBX 20000 UNIT/ML IJ SOLN
20000.0000 [IU] | Freq: Once | INTRAMUSCULAR | Status: AC
  Administered 2022-01-04: 20000 [IU] via SUBCUTANEOUS
  Filled 2022-01-04: qty 1

## 2022-01-04 NOTE — Addendum Note (Signed)
Addended by: Juanetta Beets on: 01/04/2022 10:42 AM   Modules accepted: Orders

## 2022-01-04 NOTE — Patient Instructions (Addendum)
Epoetin Alfa injection ?What is this medication? ?EPOETIN ALFA (e POE e tin AL fa) helps your body make more red blood cells. This medicine is used to treat anemia caused by chronic kidney disease, cancer chemotherapy, or HIV-therapy. It may also be used before surgery if you have anemia. ?This medicine may be used for other purposes; ask your health care provider or pharmacist if you have questions. ?COMMON BRAND NAME(S): Epogen, Procrit, Retacrit ?What should I tell my care team before I take this medication? ?They need to know if you have any of these conditions: ?cancer ?heart disease ?high blood pressure ?history of blood clots ?history of stroke ?low levels of folate, iron, or vitamin B12 in the blood ?seizures ?an unusual or allergic reaction to erythropoietin, albumin, benzyl alcohol, hamster proteins, other medicines, foods, dyes, or preservatives ?pregnant or trying to get pregnant ?breast-feeding ?How should I use this medication? ?This medicine is for injection into a vein or under the skin. It is usually given by a health care professional in a hospital or clinic setting. ?If you get this medicine at home, you will be taught how to prepare and give this medicine. Use exactly as directed. Take your medicine at regular intervals. Do not take your medicine more often than directed. ?It is important that you put your used needles and syringes in a special sharps container. Do not put them in a trash can. If you do not have a sharps container, call your pharmacist or healthcare provider to get one. ?A special MedGuide will be given to you by the pharmacist with each prescription and refill. Be sure to read this information carefully each time. ?Talk to your pediatrician regarding the use of this medicine in children. While this drug may be prescribed for selected conditions, precautions do apply. ?Overdosage: If you think you have taken too much of this medicine contact a poison control center or emergency  room at once. ?NOTE: This medicine is only for you. Do not share this medicine with others. ?What if I miss a dose? ?If you miss a dose, take it as soon as you can. If it is almost time for your next dose, take only that dose. Do not take double or extra doses. ?What may interact with this medication? ?Interactions have not been studied. ?This list may not describe all possible interactions. Give your health care provider a list of all the medicines, herbs, non-prescription drugs, or dietary supplements you use. Also tell them if you smoke, drink alcohol, or use illegal drugs. Some items may interact with your medicine. ?What should I watch for while using this medication? ?Your condition will be monitored carefully while you are receiving this medicine. ?You may need blood work done while you are taking this medicine. ?This medicine may cause a decrease in vitamin B6. You should make sure that you get enough vitamin B6 while you are taking this medicine. Discuss the foods you eat and the vitamins you take with your health care professional. ?What side effects may I notice from receiving this medication? ?Side effects that you should report to your doctor or health care professional as soon as possible: ?allergic reactions like skin rash, itching or hives, swelling of the face, lips, or tongue ?seizures ?signs and symptoms of a blood clot such as breathing problems; changes in vision; chest pain; severe, sudden headache; pain, swelling, warmth in the leg; trouble speaking; sudden numbness or weakness of the face, arm or leg ?signs and symptoms of a stroke like   changes in vision; confusion; trouble speaking or understanding; severe headaches; sudden numbness or weakness of the face, arm or leg; trouble walking; dizziness; loss of balance or coordination ?Side effects that usually do not require medical attention (report to your doctor or health care professional if they continue or are  bothersome): ?chills ?cough ?dizziness ?fever ?headaches ?joint pain ?muscle cramps ?muscle pain ?nausea, vomiting ?pain, redness, or irritation at site where injected ?This list may not describe all possible side effects. Call your doctor for medical advice about side effects. You may report side effects to FDA at 1-800-FDA-1088. ?Where should I keep my medication? ?Keep out of the reach of children. ?Store in a refrigerator between 2 and 8 degrees C (36 and 46 degrees F). Do not freeze or shake. Throw away any unused portion if using a single-dose vial. Multi-dose vials can be kept in the refrigerator for up to 21 days after the initial dose. Throw away unused medicine. ?NOTE: This sheet is a summary. It may not cover all possible information. If you have questions about this medicine, talk to your doctor, pharmacist, or health care provider. ?? 2023 Elsevier/Gold Standard (2017-03-05 00:00:00) ? ?

## 2022-01-04 NOTE — Telephone Encounter (Signed)
Per 01/04/22 los next appt scheduled and confirmed with patient 

## 2022-01-05 LAB — COMPREHENSIVE METABOLIC PANEL
ALT: 11 IU/L (ref 0–32)
AST: 11 IU/L (ref 0–40)
Albumin/Globulin Ratio: 1.2 (ref 1.2–2.2)
Albumin: 4.1 g/dL (ref 3.8–4.9)
Alkaline Phosphatase: 158 IU/L — ABNORMAL HIGH (ref 44–121)
BUN/Creatinine Ratio: 26 — ABNORMAL HIGH (ref 9–23)
BUN: 61 mg/dL — ABNORMAL HIGH (ref 6–24)
Bilirubin Total: 0.3 mg/dL (ref 0.0–1.2)
CO2: 25 mmol/L (ref 20–29)
Calcium: 9 mg/dL (ref 8.7–10.2)
Chloride: 93 mmol/L — ABNORMAL LOW (ref 96–106)
Creatinine, Ser: 2.32 mg/dL — ABNORMAL HIGH (ref 0.57–1.00)
Globulin, Total: 3.4 g/dL (ref 1.5–4.5)
Glucose: 389 mg/dL — ABNORMAL HIGH (ref 70–99)
Potassium: 5 mmol/L (ref 3.5–5.2)
Sodium: 133 mmol/L — ABNORMAL LOW (ref 134–144)
Total Protein: 7.5 g/dL (ref 6.0–8.5)
eGFR: 24 mL/min/{1.73_m2} — ABNORMAL LOW (ref >59)

## 2022-01-05 LAB — LIPID PANEL
Chol/HDL Ratio: 1.8 ratio (ref 0.0–4.4)
Cholesterol, Total: 91 mg/dL — ABNORMAL LOW (ref 100–199)
HDL: 51 mg/dL (ref >39)
LDL Chol Calc (NIH): 24 mg/dL (ref 0–99)
Triglycerides: 77 mg/dL (ref 0–149)
VLDL Cholesterol Cal: 16 mg/dL (ref 5–40)

## 2022-01-05 LAB — CARDIOVASCULAR RISK ASSESSMENT

## 2022-01-09 ENCOUNTER — Other Ambulatory Visit (HOSPITAL_COMMUNITY): Payer: Self-pay | Admitting: Family Medicine

## 2022-01-09 ENCOUNTER — Ambulatory Visit: Payer: HMO

## 2022-01-09 DIAGNOSIS — E1022 Type 1 diabetes mellitus with diabetic chronic kidney disease: Secondary | ICD-10-CM | POA: Diagnosis not present

## 2022-01-09 DIAGNOSIS — I129 Hypertensive chronic kidney disease with stage 1 through stage 4 chronic kidney disease, or unspecified chronic kidney disease: Secondary | ICD-10-CM | POA: Diagnosis not present

## 2022-01-09 DIAGNOSIS — Z794 Long term (current) use of insulin: Secondary | ICD-10-CM | POA: Diagnosis not present

## 2022-01-09 DIAGNOSIS — Z87891 Personal history of nicotine dependence: Secondary | ICD-10-CM | POA: Diagnosis not present

## 2022-01-09 DIAGNOSIS — I340 Nonrheumatic mitral (valve) insufficiency: Secondary | ICD-10-CM | POA: Diagnosis not present

## 2022-01-09 DIAGNOSIS — Z1331 Encounter for screening for depression: Secondary | ICD-10-CM | POA: Diagnosis not present

## 2022-01-09 DIAGNOSIS — F411 Generalized anxiety disorder: Secondary | ICD-10-CM | POA: Diagnosis not present

## 2022-01-09 DIAGNOSIS — D509 Iron deficiency anemia, unspecified: Secondary | ICD-10-CM | POA: Diagnosis not present

## 2022-01-09 DIAGNOSIS — N184 Chronic kidney disease, stage 4 (severe): Secondary | ICD-10-CM | POA: Diagnosis not present

## 2022-01-10 ENCOUNTER — Telehealth: Payer: Self-pay

## 2022-01-10 ENCOUNTER — Other Ambulatory Visit: Payer: Self-pay

## 2022-01-10 MED ORDER — PANTOPRAZOLE SODIUM 40 MG PO TBEC: 40.0000 mg | DELAYED_RELEASE_TABLET | Freq: Every day | ORAL | 1 refills | Status: DC

## 2022-01-10 MED ORDER — ALLOPURINOL 100 MG PO TABS: 100.0000 mg | ORAL_TABLET | Freq: Every day | ORAL | 0 refills | Status: DC

## 2022-01-10 NOTE — Telephone Encounter (Signed)
Compliant on meds 

## 2022-01-10 NOTE — Progress Notes (Signed)
Chronic Care Management Pharmacy Assistant   Name: Dana [name]  MRN: [id number] DOB: [birth year]   Reason for Encounter: Medication Coordination for [facility]    Recent office visits:  None  Recent consult visits:  01/04/22 (Oncology) Hosie Poisson MD. Seen for follow up. No med changes.   Hospital visits:  None  Medications: Outpatient Encounter Medications as of 01/10/2022  Medication Sig   allopurinol (ZYLOPRIM) 100 MG tablet Take 1 tablet by mouth daily.   amoxicillin (AMOXIL) 500 MG capsule TAKE FOUR CAPSULES BY MOUTH ONE HOUR BEFORE DENTAL PROCEDURES AND CLEANINGS   busPIRone (BUSPAR) 5 MG tablet TAKE ONE TABLET BY MOUTH THREE TIMES DAILY   carvedilol (COREG) 12.5 MG tablet TAKE ONE TABLET BY MOUTH TWICE DAILY   Cholecalciferol (VITAMIN D) 50 MCG (2000 UT) tablet Take 2,000 Units by mouth daily. Take one daily   clopidogrel (PLAVIX) 75 MG tablet Take 1 tablet by mouth daily.   Cyanocobalamin 3000 MCG CAPS Take 2 capsules by mouth daily. Patient purchased 3000 mcg gummies and taking 2 gummies daily   EPINEPHrine 0.3 mg/0.3 mL IJ SOAJ injection Use as directed for life-threatening allergic reaction.   Evolocumab (REPATHA SURECLICK) 253 MG/ML SOAJ Inject 1 Dose into the skin every 14 (fourteen) days.   ezetimibe (ZETIA) 10 MG tablet Take 1 tablet (10 mg total) by mouth daily.   ferrous sulfate 325 (65 FE) MG tablet Take 1 tablet by mouth daily.   hydrALAZINE (APRESOLINE) 25 MG tablet Take 0.5 tablets (12.5 mg total) by mouth 3 (three) times daily.   HYDROcodone-acetaminophen (NORCO) 7.5-325 MG tablet TAKE ONE TABLET BY MOUTH three times daily AS NEEDED FOR moderate pain   insulin aspart (NOVOLOG FLEXPEN) 100 UNIT/ML FlexPen 2-10 U before meals based on SSI.   Insulin Pen Needle (BD PEN NEEDLE NANO 2ND GEN) 32G X 4 MM MISC Inject 1 each into the skin in the morning, at noon, in the evening, and at bedtime.   isosorbide mononitrate (IMDUR) 30 MG 24 hr tablet Take 1 tablet  by mouth daily.   lactulose (CHRONULAC) 10 GM/15ML solution Take 15 mLs (10 g total) by mouth daily as needed for mild constipation.   latanoprost (XALATAN) 0.005 % ophthalmic solution Place 1 drop into both eyes at bedtime.   LEVEMIR FLEXTOUCH 100 UNIT/ML FlexTouch Pen INJECT 50 UNITS into THE SKIN DAILY   LORazepam (ATIVAN) 0.5 MG tablet TAKE ONE TABLET BY MOUTH DAILY AS NEEDED FOR ANXIETY   magnesium oxide (MAG-OX) 400 MG tablet Take 2 tablets (800 mg total) by mouth daily. Take 2 (400 mg) daily=800 mg   nitroGLYCERIN (NITROSTAT) 0.4 MG SL tablet Place 1 tablet (0.4 mg total) under the tongue every 5 (five) minutes as needed for chest pain.   Omega-3 Fatty Acids (FISH OIL PO) Take 1 capsule by mouth daily.   OXYGEN Inhale 2 L into the lungs at bedtime.   pantoprazole (PROTONIX) 40 MG tablet Take 1 tablet (40 mg total) by mouth daily.   potassium chloride SA (KLOR-CON M) 20 MEQ tablet Take 1 tablet by mouth daily.   Probiotic CAPS Take 1 capsule by mouth daily.   promethazine (PHENERGAN) 25 MG tablet Take 1 tablet (25 mg total) by mouth as needed.   torsemide (DEMADEX) 20 MG tablet Take 4 tablets (80 mg total) by mouth daily.   No facility-administered encounter medications on file as of 01/10/2022.    Reviewed chart for medication changes ahead of medication coordination call.  No OVs, hospital  visits since last care coordination call/Pharmacist visit.   No medication changes indicated OR if recent visit, treatment plan here.  BP Readings from Last 3 Encounters:  01/04/22 (!) 154/54  01/04/22 (!) 193/78  12/25/21 (!) 146/59    Lab Results  Component Value Date   HGBA1C 6.6 (H) 04/25/2021     Patient obtains medications through Vials  90 Days   Last adherence delivery included:  Allopurinol '100mg'$  1 tab daily Aspirin '81mg'$  1 tab daily Novolog Flexpen Inject 2-10 units before meals Buspirone '5mg'$ - 1 tab three times daily Carvedilol 12.'5mg'$ - 1 tab twice daily  Clopidogrel '75mg'$ - 1  tab at bedtime Ezetimibe '10mg'$ - 1 tab once daily Hydralazine '25mg'$ - 1 tab three times daily  Isosorbide Mono '30mg'$ - 1 tab once daily  Pantoprazole '40mg'$ - 1 tab once daily  Potassium Chloride Er 10mq- 1 tab once daily  Torsemide '20mg'$ - 4 tabs once daily  Freestyle Libre 2 Sensors- Change Sensor every 14 days. Needs a 90ds  Repatha '140mg'$ - Inject '140mg'$  every 14 days  Patient declined (meds) last delivery Lorazepam not due until 4/17 (cannot be billed until this day). Hydrocodone, Levemir not due until 4/20 (hydrocodone cannot be billed until this day).  Pen needles not due until 12/03/21.   Patient is due for next adherence delivery on: 01/22/22. Called patient and reviewed medications and coordinated delivery.  This delivery to include: Allopurinol '100mg'$  1 tab daily Novolog Flexpen Inject 2-10 units before meals Buspirone '5mg'$ - 1 tab three times daily Carvedilol 12.'5mg'$ - 1 tab twice daily  Clopidogrel '75mg'$ - 1 tab at bedtime Ezetimibe '10mg'$ - 1 tab once daily Hydralazine '25mg'$ - 1 tab three times daily  Isosorbide Mono '30mg'$ - 1 tab once daily  Pantoprazole '40mg'$ - 1 tab once daily  Potassium Chloride Er 273m- 1 tab once daily  Torsemide '20mg'$ - 4 tabs once daily  Freestyle Libre 2 Sensors- Change Sensor every 14 days. Repatha '140mg'$ - Inject '140mg'$  every 14 days Levemir Flexpen Inj 100u-inject 50 units daily    Patient declined the following medications  patient just got promethazine filled, hydrocodone and lorazepam not due yet for delivery. Aspirin '81mg'$  D/C by provider  BD Pen needles- Pt has plenty, does not need  Patient needs refills -Request sent  Allopurinol '100mg'$   Pantoprazole '40mg'$   Confirmed delivery date of 01/22/22, advised patient that pharmacy will contact them the morning of delivery.   DaElray McgregorCMHoweharmacist Assistant  [phone]

## 2022-01-12 DIAGNOSIS — Z794 Long term (current) use of insulin: Secondary | ICD-10-CM

## 2022-01-12 DIAGNOSIS — I509 Heart failure, unspecified: Secondary | ICD-10-CM

## 2022-01-12 DIAGNOSIS — E1159 Type 2 diabetes mellitus with other circulatory complications: Secondary | ICD-10-CM

## 2022-01-12 DIAGNOSIS — I2510 Atherosclerotic heart disease of native coronary artery without angina pectoris: Secondary | ICD-10-CM | POA: Diagnosis not present

## 2022-01-15 ENCOUNTER — Other Ambulatory Visit: Payer: Self-pay

## 2022-01-15 MED ORDER — POTASSIUM CHLORIDE CRYS ER 20 MEQ PO TBCR
20.0000 meq | EXTENDED_RELEASE_TABLET | Freq: Every day | ORAL | 1 refills | Status: DC
Start: 2022-01-15 — End: 2022-02-27

## 2022-01-17 ENCOUNTER — Telehealth: Payer: Self-pay | Admitting: Internal Medicine

## 2022-01-17 MED ORDER — REPATHA SURECLICK 140 MG/ML ~~LOC~~ SOAJ: 1.0000 | SUBCUTANEOUS | 1 refills | Status: DC

## 2022-01-17 NOTE — Progress Notes (Signed)
Subjective:  Patient ID: Dana [name], female    DOB: [birth year]  Age: 61 y.o. MRN: [id number]  Chief Complaint  Patient presents with  . Diabetes  . Hyperlipidemia  . Hypertension   HPI: Diabetes:  Complications: neuropathy, nephropathy, CKD, retinopathy. Glucose checking: CGM Glucose logs: AM 110-120s, averages 160s Hypoglycemia: none Most recent A1C: 7.2 Current medications: levimir 50 U daily and novolog SSI Foot checks: daily  Hyperlipidemia: Current medications: Repatha and zetia.   Hypertension: Complications:CORONARY ARTERY DISEASE, CONGESTIVE HEART FAILURE. Current medications:hydralazine, torsemide, carvedilol, Imdur, aspirin, also on plavix.   Cardiovascular: CONGESTIVE HEART FAILURE, CORONARY ARTERY DISEASE. MVP CLIP implantation which changed her from severe MVP to moderate MVP. Marland Kitchen HISTORY of cardiac rest.   OSA ON CPAP: Patient has seen Dr. Varney Baas.   Depression: well controlled on buspar, lorazepam,  sees Dr. Vita Barley.  Chronic pain syndrome: Back pain. On hydrocodone 7.5/325 mg three times a day as needed. Usually takes twice daily.   Diet: Healthy Exercise: not able to return to cardiac rehab due to anemia. Intends to return after hb improves with procrit. Dr. Hinton Rao says when hb > 10 can return.   Patient Care Team: Kennith Gain, MD as Consulting Physician (Allergy) Bensimhon, Shaune Pascal, MD as Consulting Physician (Cardiology) Mincey, Neena Rhymes, MD as Consulting Physician (Ophthalmology) Tamala Julian Hedda Slade., MD as Consulting Physician (Endocrinology) Sharyn Dross., DPM as Consulting Physician (Podiatry) Hennie Duos, MD as Consulting Physician (Rheumatology) Early Osmond, MD as Consulting Physician (Cardiology) Derwood Kaplan, MD as Consulting Physician (Oncology)  Biagio Quint - nephrology Francena Hanly, MD - pulmonology.  Giarmo - psychologist.       Current Outpatient Medications on File Prior to Visit   Medication Sig Dispense Refill  . allopurinol (ZYLOPRIM) 100 MG tablet Take 1 tablet (100 mg total) by mouth daily. 90 tablet 0  . amoxicillin (AMOXIL) 500 MG capsule TAKE FOUR CAPSULES BY MOUTH ONE HOUR BEFORE DENTAL PROCEDURES AND CLEANINGS    . busPIRone (BUSPAR) 5 MG tablet TAKE ONE TABLET BY MOUTH THREE TIMES DAILY 270 tablet 1  . carvedilol (COREG) 12.5 MG tablet TAKE ONE TABLET BY MOUTH TWICE DAILY 180 tablet 3  . Cholecalciferol (VITAMIN D) 50 MCG (2000 UT) tablet Take 2,000 Units by mouth daily. Take one daily    . clopidogrel (PLAVIX) 75 MG tablet Take 1 tablet by mouth daily.    . Cyanocobalamin 3000 MCG CAPS Take 2 capsules by mouth daily. Patient purchased 3000 mcg gummies and taking 2 gummies daily    . EPINEPHrine 0.3 mg/0.3 mL IJ SOAJ injection Use as directed for life-threatening allergic reaction. 4 Device 3  . Evolocumab (REPATHA SURECLICK) 409 MG/ML SOAJ Inject 1 Dose into the skin every 14 (fourteen) days. 6 mL 31  . ezetimibe (ZETIA) 10 MG tablet Take 1 tablet (10 mg total) by mouth daily. 90 tablet 3  . ferrous sulfate 325 (65 FE) MG tablet Take 1 tablet by mouth daily.    . hydrALAZINE (APRESOLINE) 25 MG tablet Take 0.5 tablets (12.5 mg total) by mouth 3 (three) times daily. 45 tablet 3  . HYDROcodone-acetaminophen (NORCO) 7.5-325 MG tablet TAKE ONE TABLET BY MOUTH three times daily AS NEEDED FOR moderate pain 90 tablet 0  . insulin aspart (NOVOLOG FLEXPEN) 100 UNIT/ML FlexPen 2-10 U before meals based on SSI. 15 mL 3  . Insulin Pen Needle (BD PEN NEEDLE NANO 2ND GEN) 32G X 4 MM MISC Inject 1 each into the skin in  the morning, at noon, in the evening, and at bedtime. 600 each 3  . isosorbide mononitrate (IMDUR) 30 MG 24 hr tablet Take 1 tablet by mouth daily.    Marland Kitchen lactulose (CHRONULAC) 10 GM/15ML solution Take 15 mLs (10 g total) by mouth daily as needed for mild constipation. 236 mL 1  . latanoprost (XALATAN) 0.005 % ophthalmic solution Place 1 drop into both eyes at  bedtime.    Marland Kitchen LEVEMIR FLEXTOUCH 100 UNIT/ML FlexTouch Pen INJECT 50 UNITS into THE SKIN DAILY 15 mL 1  . LORazepam (ATIVAN) 0.5 MG tablet TAKE ONE TABLET BY MOUTH DAILY AS NEEDED FOR ANXIETY 30 tablet 2  . magnesium oxide (MAG-OX) 400 MG tablet Take 2 tablets (800 mg total) by mouth daily. Take 2 (400 mg) daily=800 mg 60 tablet 1  . nitroGLYCERIN (NITROSTAT) 0.4 MG SL tablet Place 1 tablet (0.4 mg total) under the tongue every 5 (five) minutes as needed for chest pain. 25 tablet 3  . Omega-3 Fatty Acids (FISH OIL PO) Take 1 capsule by mouth daily.    . OXYGEN Inhale 2 L into the lungs at bedtime.    . pantoprazole (PROTONIX) 40 MG tablet Take 1 tablet (40 mg total) by mouth daily. 90 tablet 1  . potassium chloride SA (KLOR-CON M) 20 MEQ tablet Take 1 tablet (20 mEq total) by mouth daily. 90 tablet 1  . Probiotic CAPS Take 1 capsule by mouth daily.    . promethazine (PHENERGAN) 25 MG tablet Take 1 tablet (25 mg total) by mouth as needed. 30 tablet 3  . torsemide (DEMADEX) 20 MG tablet Take 4 tablets (80 mg total) by mouth daily. 130 tablet 5   No current facility-administered medications on file prior to visit.   Past Medical History:  Diagnosis Date  . Acquired absence of left great toe ([place])   . Acquired absence of other left toe(s) ([place])   . ACS (acute coronary syndrome) ([place])   . Anemia   . Anoxic brain injury ([place])   . Anxiety   . CAD (coronary artery disease)    DES mLAD 04/07/15; DES dRCA & DES pPDA 08/30/16; DES mCX & PTCA OM1 09/29/20  . Chronic diastolic (congestive) heart failure ([place])   . Chronic pain   . Chronic systolic (congestive) heart failure ([place])   . CKD (chronic kidney disease)   . Contrast dye induced nephropathy, possible 09/03/2020  . COPD (chronic obstructive pulmonary disease) ([place])   . Diabetes ([place])   . Diastolic CHF ([place])   . Dyspnea   . Family history of breast cancer 10/23/2021  . Gastro-esophageal reflux disease without esophagitis   . Hyperlipidemia    . Hypertension   . Hypertensive heart disease with heart failure ([place])   . Hypoxemia   . Idiopathic gout, multiple sites   . Localized edema   . Low back pain   . Mitral regurgitation   . Mitral regurgitation   . Mixed hyperlipidemia   . Mixed incontinence   . Morbid (severe) obesity due to excess calories ([place])   . Neuromuscular disorder ([place])   . Neuropathy   . Non-ST elevation (NSTEMI) myocardial infarction ([place])    08/29/20, 09/29/20  . OSA (obstructive sleep apnea) 09/03/2020  . Other specified hypothyroidism   . PAF (paroxysmal atrial fibrillation) (HCC)    s/p pulmonary vein isolation by cryoablation 10/04/15 Dr. Minna Merritts in [facility]  . PEA (Pulseless electrical activity) ([place])    ~ 01/13/21 at [facility] during admission DKA,  volume overload, possible CAP, hypoxia s/p ACLS with ROSC ~ 10-15  . Pneumonia   . Primary insomnia   . Rheumatoid arthritis ([place])   . Stroke ([employer])   . Thyroid disease   . Type 2 diabetes mellitus with diabetic nephropathy ([place])   . Vitamin D deficiency    Past Surgical History:  Procedure Laterality Date  . ABDOMINAL HYSTERECTOMY     Partial- Still has both ovaries  . CARDIOVASCULAR STRESS TEST  08/13/2014   Nuclear; Normal  . CARPAL TUNNEL RELEASE    . CATARACT EXTRACTION, BILATERAL    . CESAREAN SECTION    . CHOLECYSTECTOMY    . CORONARY ANGIOPLASTY WITH STENT PLACEMENT     3 blockages/ 1 stent  . CORONARY STENT INTERVENTION N/A 09/29/2020   Procedure: CORONARY STENT INTERVENTION;  Surgeon: Sherren Mocha, MD;  Location: [place];  Service: Cardiovascular;  Laterality: N/A;  CFX  . EYE SURGERY    . LEFT HEART CATH AND CORONARY ANGIOGRAPHY N/A 09/29/2020   Procedure: LEFT HEART CATH AND CORONARY ANGIOGRAPHY;  Surgeon: Sherren Mocha, MD;  Location: [place] [facility];  Service: Cardiovascular;  Laterality: N/A;  . MITRAL VALVE REPAIR N/A 05/18/2021   Procedure: MITRAL VALVE REPAIR;  Surgeon: Early Osmond, MD;  Location: [facility];  Service: Cardiovascular;  Laterality: N/A;  . RIGHT HEART CATH N/A 04/27/2021   Procedure: RIGHT HEART CATH;  Surgeon: Jolaine Artist, MD;  Location: [facility];  Service: Cardiovascular;  Laterality: N/A;  . RIGHT/LEFT HEART CATH AND CORONARY ANGIOGRAPHY N/A 01/07/2017   Procedure: Right/Left Heart Cath and Coronary Angiography;  Surgeon: Larey Dresser, MD;  Location: [place] [facility];  Service: Cardiovascular;  Laterality: N/A;  . ROTATOR CUFF REPAIR    . TEE WITHOUT CARDIOVERSION N/A 04/28/2021   Procedure: TRANSESOPHAGEAL ECHOCARDIOGRAM (TEE);  Surgeon: Jolaine Artist, MD;  Location: [facility];  Service: Cardiovascular;  Laterality: N/A;  . TEE WITHOUT CARDIOVERSION N/A 05/18/2021   Procedure: TRANSESOPHAGEAL ECHOCARDIOGRAM (TEE);  Surgeon: Early Osmond, MD;  Location: [place] [facility];  Service: Cardiovascular;  Laterality: N/A;  . TOE AMPUTATION Left   . US ECHOCARDIOGRAPHY  05/2017   Normal    Family History  Problem Relation Age of Onset  . Breast cancer Mother 53       contralateral breast ca dx 52  . Coronary artery disease Mother   . Hypertension Mother   . Hyperlipidemia Mother   . Diabetes type II Mother   . Lung cancer Mother 87  . Diabetes Father   . Hypertension Father   . Mental illness Sister   . Bipolar disorder Other   . Depression Other   . Schizophrenia Other   . Cancer Other        MGF's sisters x2; unknown type   Social History   Socioeconomic History  . Marital status: Married    Spouse name: Not on file  . Number of children: 2  . Years of education: 71  . Highest education level: Master's degree (e.g., MA, MS, MEng, MEd, MSW, MBA)  Occupational History  . Occupation: on disability/retired early former Education officer, museum  Tobacco Use  . Smoking status: Former    Types: Cigarettes    Start date: 23    Quit date: 1984    Years since quitting: 39.5  . Smokeless tobacco: Never  Vaping Use  . Vaping  Use: Never used  Substance and Sexual Activity  .  Alcohol use: Never  . Drug use: Never  . Sexual activity: Not on file  Other Topics Concern  . Not on file  Social History Narrative  . Not on file   Social Determinants of Health   Financial Resource Strain: Low Risk  (12/21/2021)   Overall Financial Resource Strain (CARDIA)   . Difficulty of Paying Living Expenses: Not hard at all  Food Insecurity: No Food Insecurity (04/27/2021)   Hunger Vital Sign   . Worried About Charity fundraiser in the Last Year: Never true   . Ran Out of Food in the Last Year: Never true  Transportation Needs: No Transportation Needs (12/21/2021)   PRAPARE - Transportation   . Lack of Transportation (Medical): No   . Lack of Transportation (Non-Medical): No  Physical Activity: Insufficiently Active (02/15/2020)   Exercise Vital Sign   . Days of Exercise per Week: 3 days   . Minutes of Exercise per Session: 40 min  Stress: Not on file  Social Connections: Not on file    Review of Systems  Constitutional:  Positive for fatigue. Negative for appetite change and fever.  HENT:  Negative for congestion, ear pain, sinus pressure and sore throat.   Respiratory:  Positive for shortness of breath. Negative for cough, chest tightness and wheezing.   Cardiovascular:  Negative for chest pain and palpitations.  Gastrointestinal:  Positive for nausea. Negative for abdominal pain, constipation, diarrhea and vomiting.  Genitourinary:  Negative for dysuria and hematuria.  Musculoskeletal:  Positive for arthralgias (bilateral hip and knee pain) and back pain. Negative for joint swelling and myalgias.       Right hand stiffness. Difficulty bending and extending fingers fully. Painful.  Skin:  Negative for rash.  Neurological:  Positive for weakness. Negative for dizziness and headaches.  Hematological:  Bruises/bleeds easily.  Psychiatric/Behavioral:  Negative for dysphoric mood. The patient is not nervous/anxious.       Objective:  There were no vitals taken for this visit.     01/04/2022   11:25 AM 01/04/2022   10:00 AM 12/25/2021    3:04 PM  BP/Weight  Systolic BP 563 875 643  Diastolic BP 54 78 59  Wt. (Lbs) 230.7 230.7   BMI 39.6 kg/m2 39.6 kg/m2     Physical Exam Vitals reviewed.  Constitutional:      Appearance: Normal appearance. She is obese.  Neck:     Vascular: No carotid bruit.  Cardiovascular:     Rate and Rhythm: Normal rate and regular rhythm.     Heart sounds: Normal heart sounds.  Pulmonary:     Effort: Pulmonary effort is normal.     Breath sounds: Normal breath sounds.  Abdominal:     General: Abdomen is flat. Bowel sounds are normal.     Palpations: Abdomen is soft.  Neurological:     Mental Status: She is alert and oriented to person, place, and time.  Psychiatric:        Mood and Affect: Mood normal.        Behavior: Behavior normal.  Foot exam deferred to Dr. Tamala Julian.  Diabetic Foot Exam - Simple   No data filed      Lab Results  Component Value Date   WBC 10.2 01/04/2022   HGB 9.2 (A) 01/04/2022   HCT 29 (A) 01/04/2022   PLT 241 01/04/2022   GLUCOSE 389 (H) 01/04/2022   CHOL 91 (L) 01/04/2022   TRIG 77 01/04/2022   HDL 51 01/04/2022  [place] 24 01/04/2022   ALT 11 01/04/2022   AST 11 01/04/2022   NA 133 (L) 01/04/2022   K 5.0 01/04/2022   CL 93 (L) 01/04/2022   CREATININE 2.32 (H) 01/04/2022   BUN 61 (H) 01/04/2022   CO2 25 01/04/2022   TSH 4.014 04/24/2021   INR 1.1 05/16/2021   HGBA1C 6.6 (H) 04/25/2021      Assessment & Plan:   Problem List Items Addressed This Visit       Cardiovascular and Mediastinum   Hypertensive heart and renal disease with congestive heart failure ([place])     Respiratory   OSA on CPAP     Endocrine   Diabetic nephropathy associated with type 2 diabetes mellitus ([place])   Secondary hyperparathyroidism of renal origin ([place])     Musculoskeletal and Integument   Statin myopathy     Genitourinary    Stage 3b chronic kidney disease (HCC)     Other   Depression, major, recurrent, mild ([place])   On home oxygen therapy   History of cardiac arrest   GAD (generalized anxiety disorder)   Mixed hyperlipidemia - Primary   Other Visit Diagnoses     Coronary artery disease involving native coronary artery of native heart without angina pectoris         . Total time spent on today's visit was greater than 30 minutes, including both face-to-face time and nonface-to-face time personally spent on review of chart (labs and imaging), discussing labs and goals, discussing further work-up, treatment options, referrals to specialist if needed, reviewing outside records of pertinent, answering patient's questions, and coordinating care.   Follow-up: No follow-ups on file.  An After Visit Summary was printed and given to the patient.  [name], MD [facility] [phone]

## 2022-01-17 NOTE — Telephone Encounter (Signed)
Spoke with patient's new pharmacy, [facility], to inquire about status of Rx and patient message. Was notified there was nothing wrong with Rx, second delivery had left for today, patient is due for refill today, they would contact patient about shipment.   Spoke with patient and relayed this info. She has a new [employer] grant that started 11/2021. Informed her that she would need to notify [facility] of the ID #, BIN, PCN, Grp on this ID card in order to process grant. Advised she call back tomorrow afternoon if not resolved.

## 2022-01-17 NOTE — Telephone Encounter (Signed)
Pt c/o medication issue:  1. Name of Medication:   Evolocumab (REPATHA SURECLICK) 656 MG/ML SOAJ    2. How are you currently taking this medication (dosage and times per day)? Inject 1 Dose into the skin every 14 (fourteen) days.  3. Are you having a reaction (difficulty breathing--STAT)? No  4. What is your medication issue? Pt states pharmacy is saying medication is on hold. Pt is not understanding why when she was approved to get medication through assistance program until next year. Please advise

## 2022-01-18 ENCOUNTER — Inpatient Hospital Stay: Payer: HMO | Attending: Hematology and Oncology

## 2022-01-18 VITALS — BP 138/74 | HR 73 | Temp 98.0°F | Resp 18 | Wt 230.0 lb

## 2022-01-18 DIAGNOSIS — D631 Anemia in chronic kidney disease: Secondary | ICD-10-CM | POA: Insufficient documentation

## 2022-01-18 DIAGNOSIS — N184 Chronic kidney disease, stage 4 (severe): Secondary | ICD-10-CM | POA: Insufficient documentation

## 2022-01-18 MED ORDER — EPOETIN ALFA-EPBX 20000 UNIT/ML IJ SOLN
20000.0000 [IU] | Freq: Once | INTRAMUSCULAR | Status: AC
  Administered 2022-01-18: 20000 [IU] via SUBCUTANEOUS
  Filled 2022-01-18: qty 1

## 2022-01-18 NOTE — Patient Instructions (Signed)
Epoetin Alfa injection ?What is this medication? ?EPOETIN ALFA (e POE e tin AL fa) helps your body make more red blood cells. This medicine is used to treat anemia caused by chronic kidney disease, cancer chemotherapy, or HIV-therapy. It may also be used before surgery if you have anemia. ?This medicine may be used for other purposes; ask your health care provider or pharmacist if you have questions. ?COMMON BRAND NAME(S): Epogen, Procrit, Retacrit ?What should I tell my care team before I take this medication? ?They need to know if you have any of these conditions: ?cancer ?heart disease ?high blood pressure ?history of blood clots ?history of stroke ?low levels of folate, iron, or vitamin B12 in the blood ?seizures ?an unusual or allergic reaction to erythropoietin, albumin, benzyl alcohol, hamster proteins, other medicines, foods, dyes, or preservatives ?pregnant or trying to get pregnant ?breast-feeding ?How should I use this medication? ?This medicine is for injection into a vein or under the skin. It is usually given by a health care professional in a hospital or clinic setting. ?If you get this medicine at home, you will be taught how to prepare and give this medicine. Use exactly as directed. Take your medicine at regular intervals. Do not take your medicine more often than directed. ?It is important that you put your used needles and syringes in a special sharps container. Do not put them in a trash can. If you do not have a sharps container, call your pharmacist or healthcare provider to get one. ?A special MedGuide will be given to you by the pharmacist with each prescription and refill. Be sure to read this information carefully each time. ?Talk to your pediatrician regarding the use of this medicine in children. While this drug may be prescribed for selected conditions, precautions do apply. ?Overdosage: If you think you have taken too much of this medicine contact a poison control center or emergency  room at once. ?NOTE: This medicine is only for you. Do not share this medicine with others. ?What if I miss a dose? ?If you miss a dose, take it as soon as you can. If it is almost time for your next dose, take only that dose. Do not take double or extra doses. ?What may interact with this medication? ?Interactions have not been studied. ?This list may not describe all possible interactions. Give your health care provider a list of all the medicines, herbs, non-prescription drugs, or dietary supplements you use. Also tell them if you smoke, drink alcohol, or use illegal drugs. Some items may interact with your medicine. ?What should I watch for while using this medication? ?Your condition will be monitored carefully while you are receiving this medicine. ?You may need blood work done while you are taking this medicine. ?This medicine may cause a decrease in vitamin B6. You should make sure that you get enough vitamin B6 while you are taking this medicine. Discuss the foods you eat and the vitamins you take with your health care professional. ?What side effects may I notice from receiving this medication? ?Side effects that you should report to your doctor or health care professional as soon as possible: ?allergic reactions like skin rash, itching or hives, swelling of the face, lips, or tongue ?seizures ?signs and symptoms of a blood clot such as breathing problems; changes in vision; chest pain; severe, sudden headache; pain, swelling, warmth in the leg; trouble speaking; sudden numbness or weakness of the face, arm or leg ?signs and symptoms of a stroke like   changes in vision; confusion; trouble speaking or understanding; severe headaches; sudden numbness or weakness of the face, arm or leg; trouble walking; dizziness; loss of balance or coordination ?Side effects that usually do not require medical attention (report to your doctor or health care professional if they continue or are  bothersome): ?chills ?cough ?dizziness ?fever ?headaches ?joint pain ?muscle cramps ?muscle pain ?nausea, vomiting ?pain, redness, or irritation at site where injected ?This list may not describe all possible side effects. Call your doctor for medical advice about side effects. You may report side effects to FDA at 1-800-FDA-1088. ?Where should I keep my medication? ?Keep out of the reach of children. ?Store in a refrigerator between 2 and 8 degrees C (36 and 46 degrees F). Do not freeze or shake. Throw away any unused portion if using a single-dose vial. Multi-dose vials can be kept in the refrigerator for up to 21 days after the initial dose. Throw away unused medicine. ?NOTE: This sheet is a summary. It may not cover all possible information. If you have questions about this medicine, talk to your doctor, pharmacist, or health care provider. ?? 2023 Elsevier/Gold Standard (2017-03-05 00:00:00) ? ?

## 2022-01-19 ENCOUNTER — Encounter: Payer: Self-pay | Admitting: Family Medicine

## 2022-01-19 ENCOUNTER — Ambulatory Visit (INDEPENDENT_AMBULATORY_CARE_PROVIDER_SITE_OTHER): Payer: HMO | Admitting: Family Medicine

## 2022-01-19 VITALS — BP 110/50 | HR 80 | Temp 97.4°F | Resp 18 | Ht 64.5 in | Wt 232.0 lb

## 2022-01-19 DIAGNOSIS — E1121 Type 2 diabetes mellitus with diabetic nephropathy: Secondary | ICD-10-CM | POA: Diagnosis not present

## 2022-01-19 DIAGNOSIS — N2581 Secondary hyperparathyroidism of renal origin: Secondary | ICD-10-CM | POA: Diagnosis not present

## 2022-01-19 DIAGNOSIS — I130 Hypertensive heart and chronic kidney disease with heart failure and stage 1 through stage 4 chronic kidney disease, or unspecified chronic kidney disease: Secondary | ICD-10-CM

## 2022-01-19 DIAGNOSIS — G720 Drug-induced myopathy: Secondary | ICD-10-CM | POA: Diagnosis not present

## 2022-01-19 DIAGNOSIS — N184 Chronic kidney disease, stage 4 (severe): Secondary | ICD-10-CM

## 2022-01-19 DIAGNOSIS — G4733 Obstructive sleep apnea (adult) (pediatric): Secondary | ICD-10-CM

## 2022-01-19 DIAGNOSIS — M1A4710 Other secondary chronic gout, right ankle and foot, without tophus (tophi): Secondary | ICD-10-CM

## 2022-01-19 DIAGNOSIS — E782 Mixed hyperlipidemia: Secondary | ICD-10-CM | POA: Diagnosis not present

## 2022-01-19 DIAGNOSIS — I2510 Atherosclerotic heart disease of native coronary artery without angina pectoris: Secondary | ICD-10-CM | POA: Diagnosis not present

## 2022-01-19 DIAGNOSIS — F330 Major depressive disorder, recurrent, mild: Secondary | ICD-10-CM | POA: Diagnosis not present

## 2022-01-19 DIAGNOSIS — F411 Generalized anxiety disorder: Secondary | ICD-10-CM

## 2022-01-19 DIAGNOSIS — M79641 Pain in right hand: Secondary | ICD-10-CM | POA: Diagnosis not present

## 2022-01-19 DIAGNOSIS — Z9981 Dependence on supplemental oxygen: Secondary | ICD-10-CM | POA: Diagnosis not present

## 2022-01-19 DIAGNOSIS — J9611 Chronic respiratory failure with hypoxia: Secondary | ICD-10-CM

## 2022-01-19 DIAGNOSIS — T466X5A Adverse effect of antihyperlipidemic and antiarteriosclerotic drugs, initial encounter: Secondary | ICD-10-CM

## 2022-01-19 DIAGNOSIS — F1120 Opioid dependence, uncomplicated: Secondary | ICD-10-CM

## 2022-01-19 DIAGNOSIS — Z6839 Body mass index (BMI) 39.0-39.9, adult: Secondary | ICD-10-CM

## 2022-01-19 DIAGNOSIS — Z8674 Personal history of sudden cardiac arrest: Secondary | ICD-10-CM

## 2022-01-19 DIAGNOSIS — N1832 Chronic kidney disease, stage 3b: Secondary | ICD-10-CM

## 2022-01-19 DIAGNOSIS — Z9989 Dependence on other enabling machines and devices: Secondary | ICD-10-CM

## 2022-01-19 DIAGNOSIS — D631 Anemia in chronic kidney disease: Secondary | ICD-10-CM

## 2022-01-20 LAB — COMPREHENSIVE METABOLIC PANEL
ALT: 11 IU/L (ref 0–32)
AST: 10 IU/L (ref 0–40)
Albumin/Globulin Ratio: 1.2 (ref 1.2–2.2)
Albumin: 3.8 g/dL (ref 3.8–4.9)
Alkaline Phosphatase: 140 IU/L — ABNORMAL HIGH (ref 44–121)
BUN/Creatinine Ratio: 25 — ABNORMAL HIGH (ref 9–23)
BUN: 57 mg/dL — ABNORMAL HIGH (ref 6–24)
Bilirubin Total: 0.3 mg/dL (ref 0.0–1.2)
CO2: 23 mmol/L (ref 20–29)
Calcium: 8.8 mg/dL (ref 8.7–10.2)
Chloride: 99 mmol/L (ref 96–106)
Creatinine, Ser: 2.27 mg/dL — ABNORMAL HIGH (ref 0.57–1.00)
Globulin, Total: 3.3 g/dL (ref 1.5–4.5)
Glucose: 132 mg/dL — ABNORMAL HIGH (ref 70–99)
Potassium: 4 mmol/L (ref 3.5–5.2)
Sodium: 138 mmol/L (ref 134–144)
Total Protein: 7.1 g/dL (ref 6.0–8.5)
eGFR: 24 mL/min/{1.73_m2} — ABNORMAL LOW (ref >59)

## 2022-01-20 LAB — CBC WITH DIFFERENTIAL/PLATELET
Basophils Absolute: 0.1 10*3/uL (ref 0.0–0.2)
Basos: 1 %
EOS (ABSOLUTE): 0.2 10*3/uL (ref 0.0–0.4)
Eos: 2 %
Hematocrit: 29.7 % — ABNORMAL LOW (ref 34.0–46.6)
Hemoglobin: 9.1 g/dL — ABNORMAL LOW (ref 11.1–15.9)
Immature Grans (Abs): 0 10*3/uL (ref 0.0–0.1)
Immature Granulocytes: 0 %
Lymphocytes Absolute: 2 10*3/uL (ref 0.7–3.1)
Lymphs: 19 %
MCH: 26.1 pg — ABNORMAL LOW (ref 26.6–33.0)
MCHC: 30.6 g/dL — ABNORMAL LOW (ref 31.5–35.7)
MCV: 85 fL (ref 79–97)
Monocytes Absolute: 0.8 10*3/uL (ref 0.1–0.9)
Monocytes: 8 %
Neutrophils Absolute: 7.1 10*3/uL — ABNORMAL HIGH (ref 1.4–7.0)
Neutrophils: 70 %
Platelets: 241 10*3/uL (ref 150–450)
RBC: 3.49 x10E6/uL — ABNORMAL LOW (ref 3.77–5.28)
RDW: 15.7 % — ABNORMAL HIGH (ref 11.7–15.4)
WBC: 10.1 10*3/uL (ref 3.4–10.8)

## 2022-01-20 LAB — LIPID PANEL
Chol/HDL Ratio: 1.8 ratio (ref 0.0–4.4)
Cholesterol, Total: 83 mg/dL — ABNORMAL LOW (ref 100–199)
HDL: 46 mg/dL (ref >39)
LDL Chol Calc (NIH): 21 mg/dL (ref 0–99)
Triglycerides: 72 mg/dL (ref 0–149)
VLDL Cholesterol Cal: 16 mg/dL (ref 5–40)

## 2022-01-20 LAB — HEMOGLOBIN A1C
Est. average glucose Bld gHb Est-mCnc: 189 mg/dL
Hgb A1c MFr Bld: 8.2 % — ABNORMAL HIGH (ref 4.8–5.6)

## 2022-01-20 LAB — TSH: TSH: 2.9 u[IU]/mL (ref 0.450–4.500)

## 2022-01-20 LAB — CARDIOVASCULAR RISK ASSESSMENT

## 2022-01-20 LAB — URIC ACID: Uric Acid: 9 mg/dL — ABNORMAL HIGH (ref 3.0–7.2)

## 2022-01-22 ENCOUNTER — Other Ambulatory Visit: Payer: Self-pay

## 2022-01-22 LAB — RHEUMATOID FACTOR: Rheumatoid fact SerPl-aCnc: 223.1 IU/mL — ABNORMAL HIGH (ref <14.0)

## 2022-01-22 LAB — CYCLIC CITRUL PEPTIDE ANTIBODY, IGG/IGA: Cyclic Citrullin Peptide Ab: 7 units (ref 0–19)

## 2022-01-22 MED ORDER — ALLOPURINOL 300 MG PO TABS: 300.0000 mg | ORAL_TABLET | Freq: Every day | ORAL | 1 refills | Status: DC

## 2022-01-23 NOTE — Assessment & Plan Note (Signed)
Recommend continue to work on eating healthy diet and exercise.  

## 2022-01-23 NOTE — Assessment & Plan Note (Signed)
>>  ASSESSMENT AND PLAN FOR DIABETIC NEPHROPATHY ASSOCIATED WITH TYPE 2 DIABETES MELLITUS (HCC) WRITTEN ON 01/23/2022 11:11 PM BY COX, KIRSTEN, MD  Control: worsened Recommend CGM. Recommend check feet daily. Recommend annual eye exams. Medicines: no changes. Defer to Dr. Katrinka Blazing. Send labs.  Continue to work on eating a healthy diet and exercise.  Labs drawn today.

## 2022-01-23 NOTE — Assessment & Plan Note (Signed)
Control: worsened Recommend CGM. Recommend check feet daily. Recommend annual eye exams. Medicines: no changes. Defer to Dr. Tamala Julian. Send labs.  Continue to work on eating a healthy diet and exercise.  Labs drawn today.

## 2022-01-23 NOTE — Assessment & Plan Note (Signed)
stable °

## 2022-01-23 NOTE — Assessment & Plan Note (Addendum)
The current medical regimen is effective;  continue present plan and medications. Continue buspar, lorazepam,  sees Dr. Vita Barley.

## 2022-01-23 NOTE — Assessment & Plan Note (Signed)
Continue cpap.  

## 2022-01-23 NOTE — Assessment & Plan Note (Signed)
Follow with Dr. Hinton Rao

## 2022-01-23 NOTE — Assessment & Plan Note (Signed)
Well controlled.  No changes to medicines.  Continue hydralazine, torsemide, carvedilol, Imdur, aspirin, also on plavix.  Continue to work on eating a healthy diet and exercise.  Labs drawn today.

## 2022-01-23 NOTE — Assessment & Plan Note (Signed)
The current medical regimen is effective;  continue present plan and medications.  

## 2022-01-23 NOTE — Assessment & Plan Note (Signed)
Check uric acid. Came back at 9. Recommend increase allopurinol to 300 mg daily.

## 2022-01-23 NOTE — Assessment & Plan Note (Signed)
>>  ASSESSMENT AND PLAN FOR CKD STAGE 3B, GFR 30-44 ML/MIN (HCC) WRITTEN ON 01/23/2022 11:09 PM BY COX, KIRSTEN, MD  stable

## 2022-01-23 NOTE — Assessment & Plan Note (Signed)
Well controlled.  ?No changes to medicines.  ?Continue to work on eating a healthy diet and exercise.  ?Labs drawn today.  ?

## 2022-01-23 NOTE — Assessment & Plan Note (Signed)
Intolerant to statins. 

## 2022-01-23 NOTE — Assessment & Plan Note (Signed)
Management per specialist.  Dr. Sadiq.  

## 2022-01-25 ENCOUNTER — Ambulatory Visit (INDEPENDENT_AMBULATORY_CARE_PROVIDER_SITE_OTHER): Payer: PPO | Admitting: Orthopaedic Surgery

## 2022-01-25 ENCOUNTER — Encounter: Payer: Self-pay | Admitting: Orthopaedic Surgery

## 2022-01-25 ENCOUNTER — Ambulatory Visit (INDEPENDENT_AMBULATORY_CARE_PROVIDER_SITE_OTHER): Payer: PPO

## 2022-01-25 ENCOUNTER — Encounter: Payer: Self-pay | Admitting: Oncology

## 2022-01-25 DIAGNOSIS — M79641 Pain in right hand: Secondary | ICD-10-CM | POA: Diagnosis not present

## 2022-01-25 NOTE — Addendum Note (Signed)
Addended by: Lendon Collar on: 01/25/2022 09:50 AM   Modules accepted: Orders

## 2022-01-25 NOTE — Progress Notes (Signed)
Office Visit Note   Patient: Dana [name]           Date of Birth: 05/07/1961           MRN: [id number] Visit Date: 01/25/2022              Requested by: [name], MD [address] [place],  [place] [address] PCP: [name], MD   Assessment & Plan: Visit Diagnoses:  1. Pain in right hand     Plan: Impression is chronic right hand pain.  Nothing surgical that I can identify.  Based on findings highly suspicious for rheumatologic or gouty process.  Patient was not happy with previous rheumatologist and requests a referral to a new office.  Recently had blood work which was positive for rheumatoid factor and high uric acid.  Follow-Up Instructions: No follow-ups on file.   Orders:  Orders Placed This Encounter  Procedures   XR Hand Complete Right   No orders of the defined types were placed in this encounter.     Procedures: No procedures performed   Clinical Data: No additional findings.   Subjective: Chief Complaint  Patient presents with   Right Hand - Pain    HPI [name] comes in today for evaluation of chronic right hand pain worse in the ring finger.  Denies any injuries.  Complains of chronic pain and stiffness and difficulty with grasping. Review of Systems  Constitutional: Negative.   HENT: Negative.    Eyes: Negative.   Respiratory: Negative.    Cardiovascular: Negative.   Endocrine: Negative.   Musculoskeletal: Negative.   Neurological: Negative.   Hematological: Negative.   Psychiatric/Behavioral: Negative.    All other systems reviewed and are negative.    Objective: Vital Signs: There were no vitals taken for this visit.  Physical Exam Vitals and nursing note reviewed.  Constitutional:      Appearance: She is well-developed.  Pulmonary:     Effort: Pulmonary effort is normal.  Skin:    General: Skin is warm.     Capillary Refill: Capillary refill takes less than 2 seconds.  Neurological:     Mental Status: She is  alert and oriented to person, place, and time.  Psychiatric:        Behavior: Behavior normal.        Thought Content: Thought content normal.        Judgment: Judgment normal.     Ortho Exam Examination of the right hand shows contracture of the PIP joint of the ring finger.  Negative carpal tunnel compressive signs.  She has generalized tenderness to palpation of most joints.  Normal capillary refill. Specialty Comments:  No specialty comments available.  Imaging: XR Hand Complete Right  Result Date: 01/25/2022 No acute or structural abnormalities.  Generalized osteopenia.    PMFS History: Patient Active Problem List   Diagnosis Date Noted   Genetic testing 11/07/2021   Family history of breast cancer 10/23/2021   GAD (generalized anxiety disorder) 07/11/2021   Mixed hyperlipidemia 07/11/2021   S/P mitral valve clip implantation 05/18/2021   Anemia due to stage 4 chronic kidney disease ([place]) 04/04/2021   History of cardiac arrest 02/06/2021   Bilateral lower extremity edema 01/23/2021   Anoxic brain injury ([place]) 01/20/2021   Impaired mobility and ADLs 01/20/2021   Left-sided weakness 01/20/2021   Statin myopathy 01/03/2021   Secondary hyperparathyroidism of renal origin ([place]) 11/18/2020   Vitamin D deficiency 11/18/2020   Contrast dye  induced nephropathy, possible 09/03/2020   OSA on CPAP 09/03/2020   Need for COVID-19 vaccine 06/28/2020   Medicare annual wellness visit, subsequent 06/13/2020   Chronic respiratory failure with hypoxia ([place]) 03/23/2020   Chronic gout without tophus 03/23/2020   Pain in both hands 41/28/7867   Uncomplicated opioid dependence ([place]) 03/23/2020   Depression, major, recurrent, mild ([place]) 12/21/2019   Hypomagnesemia 12/21/2019   Diarrhea 12/21/2019   Lumbar back pain 10/05/2019   Diabetic nephropathy associated with type 2 diabetes mellitus ([place]) 10/05/2019   Hypertensive heart and renal disease with congestive heart failure ([place])  09/18/2019   Demand ischemia (HCC)    Diastolic heart failure ([place]) 11/07/2017   Class 2 severe obesity due to excess calories with serious comorbidity and body mass index (BMI) of 39.0 to 39.9 in adult [facility]) 11/07/2017   Coronary artery disease of native artery of native heart with stable angina pectoris ([place]) 12/25/2016   Hyperlipidemia LDL goal <70 12/14/2016   Anemia 03/23/2016   Chronic pain syndrome 03/23/2016   GERD without esophagitis 03/23/2016   On home oxygen therapy 03/23/2016   S/P ablation of atrial fibrillation 10/04/2015   Diabetic polyneuropathy associated with type 2 diabetes mellitus ([place]) 04/02/2014   Past Medical History:  Diagnosis Date   Acquired absence of left great toe (HCC)    Acquired absence of other left toe(s) ([place])    ACS (acute coronary syndrome) ([place])    Anemia    Anoxic brain injury ([place])    Anxiety    CAD (coronary artery disease)    DES mLAD 04/07/15; DES dRCA & DES pPDA 08/30/16; DES mCX & PTCA OM1 09/29/20   Chronic diastolic (congestive) heart failure (HCC)    Chronic pain    Chronic systolic (congestive) heart failure (HCC)    CKD (chronic kidney disease)    Contrast dye induced nephropathy, possible 09/03/2020   COPD (chronic obstructive pulmonary disease) (HCC)    Diabetes (HCC)    Diastolic CHF ([place])    Dyspnea    Family history of breast cancer 10/23/2021   Gastro-esophageal reflux disease without esophagitis    Hyperlipidemia    Hypertension    Hypertensive heart disease with heart failure (HCC)    Hypoxemia    Idiopathic gout, multiple sites    Localized edema    Low back pain    Mitral regurgitation    Mitral regurgitation    Mixed hyperlipidemia    Mixed incontinence    Morbid (severe) obesity due to excess calories (HCC)    Neuromuscular disorder (HCC)    Neuropathy    Non-ST elevation (NSTEMI) myocardial infarction ([place])    08/29/20, 09/29/20   OSA (obstructive sleep apnea) 09/03/2020   Other specified hypothyroidism     PAF (paroxysmal atrial fibrillation) (HCC)    s/p pulmonary vein isolation by cryoablation 10/04/15 Dr. Minna Merritts in [facility]   PEA (Pulseless electrical activity) [facility])    ~ 01/13/21 at [facility] during admission DKA, volume overload, possible CAP, hypoxia s/p ACLS with ROSC ~ 10-15   Pneumonia    Primary insomnia    Rheumatoid arthritis ([place])    Stroke ([place])    Thyroid disease    Type 2 diabetes mellitus with diabetic nephropathy (HCC)    Vitamin D deficiency     Family History  Problem Relation Age of Onset   Breast cancer Mother 39       contralateral breast ca dx 34   Coronary artery disease Mother  Hypertension Mother    Hyperlipidemia Mother    Diabetes type II Mother    Lung cancer Mother 34   Diabetes Father    Hypertension Father    Mental illness Sister    Bipolar disorder Other    Depression Other    Schizophrenia Other    Cancer Other        MGF's sisters x2; unknown type    Past Surgical History:  Procedure Laterality Date   ABDOMINAL HYSTERECTOMY     Partial- Still has both ovaries   CARDIOVASCULAR STRESS TEST  08/13/2014   Nuclear; Normal   CARPAL TUNNEL RELEASE     CATARACT EXTRACTION, BILATERAL     CESAREAN SECTION     CHOLECYSTECTOMY     CORONARY ANGIOPLASTY WITH STENT PLACEMENT     3 blockages/ 1 stent   CORONARY STENT INTERVENTION N/A 09/29/2020   Procedure: CORONARY STENT INTERVENTION;  Surgeon: Sherren Mocha, MD;  Location: [facility];  Service: Cardiovascular;  Laterality: N/A;  CFX   EYE SURGERY     LEFT HEART CATH AND CORONARY ANGIOGRAPHY N/A 09/29/2020   Procedure: LEFT HEART CATH AND CORONARY ANGIOGRAPHY;  Surgeon: Sherren Mocha, MD;  Location: [facility];  Service: Cardiovascular;  Laterality: N/A;   MITRAL VALVE REPAIR N/A 05/18/2021   Procedure: MITRAL VALVE REPAIR;  Surgeon: Early Osmond, MD;  Location: [facility];  Service: Cardiovascular;  Laterality: N/A;   RIGHT HEART CATH N/A 04/27/2021    Procedure: RIGHT HEART CATH;  Surgeon: Jolaine Artist, MD;  Location: [place];  Service: Cardiovascular;  Laterality: N/A;   RIGHT/LEFT HEART CATH AND CORONARY ANGIOGRAPHY N/A 01/07/2017   Procedure: Right/Left Heart Cath and Coronary Angiography;  Surgeon: Larey Dresser, MD;  Location: [facility];  Service: Cardiovascular;  Laterality: N/A;   ROTATOR CUFF REPAIR     TEE WITHOUT CARDIOVERSION N/A 04/28/2021   Procedure: TRANSESOPHAGEAL ECHOCARDIOGRAM (TEE);  Surgeon: Jolaine Artist, MD;  Location: [facility];  Service: Cardiovascular;  Laterality: N/A;   TEE WITHOUT CARDIOVERSION N/A 05/18/2021   Procedure: TRANSESOPHAGEAL ECHOCARDIOGRAM (TEE);  Surgeon: Early Osmond, MD;  Location: [place] [facility];  Service: Cardiovascular;  Laterality: N/A;   TOE AMPUTATION Left    US ECHOCARDIOGRAPHY  05/2017   Normal   Social History   Occupational History   Occupation: on disability/retired early former Education officer, [employer]  Tobacco Use   Smoking status: Former    Types: Cigarettes    Start date: 1983    Quit date: 1984    Years since quitting: 39.5   Smokeless tobacco: Never  Vaping Use   Vaping Use: Never used  Substance and Sexual Activity   Alcohol use: Never   Drug use: Never   Sexual activity: Not on file

## 2022-01-30 ENCOUNTER — Other Ambulatory Visit: Payer: Self-pay | Admitting: Family Medicine

## 2022-01-30 ENCOUNTER — Other Ambulatory Visit: Payer: Self-pay | Admitting: Legal Medicine

## 2022-01-30 DIAGNOSIS — F411 Generalized anxiety disorder: Secondary | ICD-10-CM | POA: Diagnosis not present

## 2022-02-01 ENCOUNTER — Inpatient Hospital Stay: Payer: HMO

## 2022-02-01 ENCOUNTER — Encounter: Payer: Self-pay | Admitting: Oncology

## 2022-02-01 ENCOUNTER — Telehealth: Payer: Self-pay | Admitting: Oncology

## 2022-02-01 ENCOUNTER — Inpatient Hospital Stay (INDEPENDENT_AMBULATORY_CARE_PROVIDER_SITE_OTHER): Payer: HMO | Admitting: Oncology

## 2022-02-01 ENCOUNTER — Other Ambulatory Visit: Payer: Self-pay | Admitting: Oncology

## 2022-02-01 ENCOUNTER — Other Ambulatory Visit: Payer: Self-pay | Admitting: Pharmacist

## 2022-02-01 VITALS — BP 144/64 | HR 74 | Temp 97.7°F | Resp 18 | Ht 64.5 in | Wt 230.6 lb

## 2022-02-01 VITALS — BP 125/54 | HR 69 | Temp 98.1°F | Resp 18 | Ht 64.5 in | Wt 229.2 lb

## 2022-02-01 DIAGNOSIS — N189 Chronic kidney disease, unspecified: Secondary | ICD-10-CM

## 2022-02-01 DIAGNOSIS — N184 Chronic kidney disease, stage 4 (severe): Secondary | ICD-10-CM

## 2022-02-01 DIAGNOSIS — D631 Anemia in chronic kidney disease: Secondary | ICD-10-CM

## 2022-02-01 DIAGNOSIS — D649 Anemia, unspecified: Secondary | ICD-10-CM | POA: Diagnosis not present

## 2022-02-01 LAB — CBC AND DIFFERENTIAL
HCT: 30 — AB (ref 36–46)
Hemoglobin: 9.6 — AB (ref 12.0–16.0)
Neutrophils Absolute: 9.5
Platelets: 250 10*3/uL (ref 150–400)
WBC: 12.5

## 2022-02-01 LAB — CBC: RBC: 3.6 — AB (ref 3.87–5.11)

## 2022-02-01 MED ORDER — EPOETIN ALFA-EPBX 10000 UNIT/ML IJ SOLN
10000.0000 [IU] | Freq: Once | INTRAMUSCULAR | Status: AC
  Administered 2022-02-01: 10000 [IU] via SUBCUTANEOUS
  Filled 2022-02-01: qty 1

## 2022-02-01 MED ORDER — EPOETIN ALFA-EPBX 20000 UNIT/ML IJ SOLN: 20000.0000 [IU] | Freq: Once | INTRAMUSCULAR | Status: DC

## 2022-02-01 NOTE — Telephone Encounter (Signed)
Per 02/01/22 los next appt scheduled and confirmed with appt

## 2022-02-01 NOTE — Progress Notes (Signed)
[place]  [address] [place],  [place]  [address] [phone]  Clinic Day:  02/01/22  Referring physician: [name], MD  eASSESSMENT & PLAN:   Chronic anemia that has been worsening.  This is anemia of chronic kidney disease/anemia of chronic disease.  Repeat iron studies in March did not reveal iron deficiency, soluble transferrin receptor was normal. No hemolysis was found.  Serum protein electrophoresis is negative for a monoclonal spike.  I therefore recommended that she increase the Retacrit to 20,000 units every 2 weeks. Stool Hemoccults were negative.  Her hemoglobin has come up from 8.4 to 9., and now 9.6 so she is responding to this dose well.  2.   Strong family history of breast cancer.  I referred her to the genetic counselor.   I increased her Retacrit to 20,000 units every 2 weeks and she seems to be responding to that fairly well. We will check her monthly with labs and see her back in 4 weeks..  She understands and agrees with this plan of care.  I provided 20 minutes of face-to-face time during this this encounter and > 50% was spent counseling as documented under my assessment and plan.    Derwood Kaplan, MD [facility] [address] [place] [address] Dept: [phone] Dept Fax: [phone]    CHIEF COMPLAINT:  CC: Iron deficiency  Current Treatment:  Observation   HISTORY OF PRESENT ILLNESS:  Dana [name] is a 61 y.o. female with  a complicated medical history, including stage IV chronic kidney disease, who is referred in consultation by Dr. Biagio Quint, nephrology, for assessment and management of iron deficiency.  She had worsening anemia, so was given a dose of Retacrit in December.  She then was then found to have a low iron saturation of 11% with a TIBC of 271 and ferritin 106 on February 10th, and gives a history of allergic reaction  to IV iron, so was referred here.  She states she broke out in severe hives after receiving IV iron in the past 1 to 2 years.  She states she also has had blood transfusion on several occasions, most recent being in December 2022.  She has a history of hypoxic brain injury in June 2022, so has difficulty with dates. She reports severe fatigue as well as dyspnea with limited exertion.  She denies overt form of blood loss.  She is on clopidogrel. Review of her records reveal she received 1 unit of packed cells in November 2022 when her hemoglobin was 7.2.  She was given IV Feraheme in February 2022 when admitted for acute on chronic diastolic heart failure.  She reported severe generalized itching after receiving the first dose of Feraheme.  No hives were noted by the examining provider.  The 2nd dose of Feraheme was not given.  She was transfused in February 2022 when her hemoglobin was 7.8 and again in March 2022 when her hemoglobin was 7.   In 2017, when she reported rectal bleeding, as well as black tarry stools, EGD and colonoscopy were recommended.  EGD in September 2017 revealed a pyloric erosion.  I was unable to access the colonoscopy report from September 2017. She had EGD in September 2014 that was normal.  She was diagnosed with gastroparesis in September 2014.  It was noted in her chart that she had a colonoscopy in April 2014 which was normal, but I  could not find a report.  She was also transfused in April 2013 at [facility] when her hemoglobin was 7.4.  She has had chronic anemia since 2010.   Her family history is significant in that her mother had breast cancer twice and ended up with bilateral mastectomies.  She also has 2 maternal aunts that had breast cancer.  I will therefore refer her to the genetic counselor for their opinion regarding testing.  She does not know if her mother has been tested.  INTERVAL HISTORY:  I have reviewed her chart and materials related to her extensively and  collaborated history with the patient. Summary of oncologic history is as follows: Oncology History   No history exists.  Dana [name] is here for routine follow up of anemia of chronic disease, associated with chronic kidney disease stage 4. We increased her Retacrit injections to every two weeks in April with 20,000 units of Retacrit. We will continue with this treatment. Her hemoglobin was 9.1 last visit and is now 9.6. Her blood counts look normal. Her  appetite is good, and she has lost 2 pounds since her last visit.   Wt Readings from Last 3 Encounters:  03/01/22 229 lb 6.4 oz (104.1 kg)  02/02/22 231 lb (104.8 kg)  02/01/22 229 lb 4 oz (104 kg)  She denies fever, chills or other signs of infection.  She denies nausea, vomiting, bowel issues, or abdominal pain.  She denies sore throat, cough, dyspnea, or chest pain.  HISTORY:   Past Medical History:  Diagnosis Date   Acquired absence of left great toe (HCC)    Acquired absence of other left toe(s) ([place])    ACS (acute coronary syndrome) (HCC)    Anemia    Anoxic brain injury ([place])    Anxiety    CAD (coronary artery disease)    DES mLAD 04/07/15; DES dRCA & DES pPDA 08/30/16; DES mCX & PTCA OM1 09/29/20   Chronic diastolic (congestive) heart failure (HCC)    Chronic pain    Chronic systolic (congestive) heart failure (HCC)    CKD (chronic kidney disease)    Contrast dye induced nephropathy, possible 09/03/2020   COPD (chronic obstructive pulmonary disease) (HCC)    Diabetes (HCC)    Diastolic CHF (HCC)    Dyspnea    Family history of breast cancer 10/23/2021   Gastro-esophageal reflux disease without esophagitis    Hyperlipidemia    Hypertension    Hypertensive heart disease with heart failure (HCC)    Hypoxemia    Idiopathic gout, multiple sites    Localized edema    Low back pain    Mitral regurgitation    Mitral regurgitation    Mixed hyperlipidemia    Mixed incontinence    Morbid (severe) obesity due to excess calories (HCC)     Neuromuscular disorder (HCC)    Neuropathy    Non-ST elevation (NSTEMI) myocardial infarction ([place])    08/29/20, 09/29/20   OSA (obstructive sleep apnea) 09/03/2020   Other specified hypothyroidism    PAF (paroxysmal atrial fibrillation) (HCC)    s/p pulmonary vein isolation by cryoablation 10/04/15 Dr. Minna Merritts in [facility]   PEA (Pulseless electrical activity) [facility])    ~ 01/13/21 at [facility] during admission DKA, volume overload, possible CAP, hypoxia s/p ACLS with ROSC ~ 10-15   Pneumonia    Primary insomnia    Rheumatoid arthritis ([place])    Stroke ([place])    Thyroid disease    Type 2 diabetes mellitus  with diabetic nephropathy ([place])    Vitamin D deficiency     Past Surgical History:  Procedure Laterality Date   ABDOMINAL HYSTERECTOMY     Partial- Still has both ovaries   CARDIOVASCULAR STRESS TEST  08/13/2014   Nuclear; Normal   CARPAL TUNNEL RELEASE     CATARACT EXTRACTION, BILATERAL     CESAREAN SECTION     CHOLECYSTECTOMY     CORONARY ANGIOPLASTY WITH STENT PLACEMENT     3 blockages/ 1 stent   CORONARY STENT INTERVENTION N/A 09/29/2020   Procedure: CORONARY STENT INTERVENTION;  Surgeon: Sherren Mocha, MD;  Location: [facility];  Service: Cardiovascular;  Laterality: N/A;  CFX   EYE SURGERY     LEFT HEART CATH AND CORONARY ANGIOGRAPHY N/A 09/29/2020   Procedure: LEFT HEART CATH AND CORONARY ANGIOGRAPHY;  Surgeon: Sherren Mocha, MD;  Location: [facility];  Service: Cardiovascular;  Laterality: N/A;   MITRAL VALVE REPAIR N/A 05/18/2021   Procedure: MITRAL VALVE REPAIR;  Surgeon: Early Osmond, MD;  Location: [facility];  Service: Cardiovascular;  Laterality: N/A;   RIGHT HEART CATH N/A 04/27/2021   Procedure: RIGHT HEART CATH;  Surgeon: Jolaine Artist, MD;  Location: [facility];  Service: Cardiovascular;  Laterality: N/A;   RIGHT/LEFT HEART CATH AND CORONARY ANGIOGRAPHY N/A 01/07/2017   Procedure: Right/Left Heart Cath and Coronary  Angiography;  Surgeon: Larey Dresser, MD;  Location: [facility];  Service: Cardiovascular;  Laterality: N/A;   ROTATOR CUFF REPAIR     TEE WITHOUT CARDIOVERSION N/A 04/28/2021   Procedure: TRANSESOPHAGEAL ECHOCARDIOGRAM (TEE);  Surgeon: Jolaine Artist, MD;  Location: [facility];  Service: Cardiovascular;  Laterality: N/A;   TEE WITHOUT CARDIOVERSION N/A 05/18/2021   Procedure: TRANSESOPHAGEAL ECHOCARDIOGRAM (TEE);  Surgeon: Early Osmond, MD;  Location: [facility];  Service: Cardiovascular;  Laterality: N/A;   TOE AMPUTATION Left    US ECHOCARDIOGRAPHY  05/2017   Normal    Family History  Problem Relation Age of Onset   Breast cancer Mother 80       contralateral breast ca dx 94   Coronary artery disease Mother    Hypertension Mother    Hyperlipidemia Mother    Diabetes type II Mother    Lung cancer Mother 2   Diabetes Father    Hypertension Father    Mental illness Sister    Bipolar disorder Other    Depression Other    Schizophrenia Other    Cancer Other        MGF's sisters x2; unknown type    Social History:  reports that she quit smoking about 39 years ago. Her smoking use included cigarettes. She started smoking about 40 years ago. She has never used smokeless tobacco. She reports that she does not drink alcohol and does not use drugs.The patient is alone today.  Allergies:  Allergies  Allergen Reactions   Naproxen Hives and Rash   Celecoxib Other (See Comments)    Stomach pain   Aspirin Other (See Comments)    CAN NOT TAKE DUE TO ULCERS (documented previously) but takes every day currently (as of 09/29/20) without reported issues Can take coated aspirin   Glucosamine Hives, Swelling and Other (See Comments)    Angioedema    Iron     IV iron transfusion - hives - Feb 2022 hospitalization   Metoclopramide Other (See Comments)    Facial twitching and stuttering   Metolazone Other (  See Comments)    Acute renal failure   Other Hives     Strawberry allergy   Shellfish-Derived Products Hives, Swelling and Other (See Comments)    Angioedema    Statins Other (See Comments)    Muscle pain   Strawberry (Diagnostic)    Strawberry Extract    Strawberry Flavor    Strawberry Leaves Extract    Tramadol Nausea And Vomiting         Current Medications: Current Outpatient Medications  Medication Sig Dispense Refill   allopurinol (ZYLOPRIM) 300 MG tablet Take 1 tablet (300 mg total) by mouth daily. 90 tablet 1   amoxicillin (AMOXIL) 500 MG capsule TAKE FOUR CAPSULES BY MOUTH ONE HOUR BEFORE DENTAL PROCEDURES AND CLEANINGS     busPIRone (BUSPAR) 5 MG tablet TAKE ONE TABLET BY MOUTH THREE TIMES DAILY 270 tablet 1   carvedilol (COREG) 12.5 MG tablet TAKE ONE TABLET BY MOUTH TWICE DAILY 180 tablet 3   clopidogrel (PLAVIX) 75 MG tablet Take 1 tablet by mouth daily.     Continuous Blood Gluc Sensor (FREESTYLE LIBRE 2 SENSOR) MISC USE TO check blood glucose AS DIRECTED AND CHANGE sensor every 14 DAYS     Cyanocobalamin 3000 MCG CAPS Take 2 capsules by mouth daily. Patient purchased 3000 mcg gummies and taking 2 gummies daily     EPINEPHrine 0.3 mg/0.3 mL IJ SOAJ injection Use as directed for life-threatening allergic reaction. 4 Device 3   ergocalciferol (VITAMIN D2) 1.25 MG (50000 UT) capsule Take by mouth once a week.     Evolocumab (REPATHA SURECLICK) 505 MG/ML SOAJ Inject 1 Dose into the skin every 14 (fourteen) days. 6 mL 1   ezetimibe (ZETIA) 10 MG tablet Take 1 tablet (10 mg total) by mouth daily. 90 tablet 3   hydrALAZINE (APRESOLINE) 25 MG tablet Take 1 tablet (25 mg total) by mouth 3 (three) times daily. 90 tablet 6   HYDROcodone-acetaminophen (NORCO) 7.5-325 MG tablet TAKE ONE TABLET BY MOUTH three times daily AS NEEDED FOR moderate pain 90 tablet 0   insulin aspart (NOVOLOG FLEXPEN) 100 UNIT/ML FlexPen 2-10 U before meals based on SSI. 15 mL 3   Insulin Pen Needle (BD PEN NEEDLE NANO 2ND GEN) 32G X 4 MM MISC Inject 1 each into  the skin in the morning, at noon, in the evening, and at bedtime. 600 each 3   isosorbide mononitrate (IMDUR) 30 MG 24 hr tablet Take 1 tablet by mouth daily.     lactulose (CHRONULAC) 10 GM/15ML solution Take 15 mLs (10 g total) by mouth daily as needed for mild constipation. 236 mL 1   latanoprost (XALATAN) 0.005 % ophthalmic solution Place 1 drop into both eyes at bedtime.     LEVEMIR FLEXPEN 100 UNIT/ML FlexPen inejct 50 units into THE SKIN daily 15 mL 1   LORazepam (ATIVAN) 0.5 MG tablet TAKE ONE TABLET BY MOUTH ONCE DAILY AS NEEDED FOR ANXIETY 30 tablet 2   magnesium oxide (MAG-OX) 400 MG tablet Take 2 tablets (800 mg total) by mouth daily. Take 2 (400 mg) daily=800 mg 60 tablet 1   nitroGLYCERIN (NITROSTAT) 0.4 MG SL tablet Place 1 tablet (0.4 mg total) under the tongue every 5 (five) minutes as needed for chest pain. 25 tablet 3   Omega-3 Fatty Acids (FISH OIL PO) Take 1 capsule by mouth daily.     OXYGEN Inhale 2 L into the lungs at bedtime.     pantoprazole (PROTONIX) 40 MG tablet Take 1 tablet (40  mg total) by mouth daily. 90 tablet 1   Potassium Chloride ER 20 MEQ TBCR Take 1 tablet by mouth daily.     Probiotic CAPS Take 1 capsule by mouth daily.     promethazine (PHENERGAN) 25 MG tablet Take 1 tablet (25 mg total) by mouth as needed. 30 tablet 3   torsemide (DEMADEX) 20 MG tablet Take 4 tablets (80 mg total) by mouth daily. 130 tablet 5   No current facility-administered medications for this visit.    REVIEW OF SYSTEMS:  Review of Systems  Constitutional:  Positive for fatigue.  Respiratory:  Positive for shortness of breath.   Musculoskeletal:  Positive for back pain.      VITALS:  Blood pressure (!) 144/64, pulse 74, temperature 97.7 F (36.5 C), temperature source Oral, resp. rate 18, height 5' 4.5" (1.638 m), weight 230 lb 9.6 oz (104.6 kg), SpO2 96 %.  Wt Readings from Last 3 Encounters:  03/01/22 229 lb 6.4 oz (104.1 kg)  02/02/22 231 lb (104.8 kg)  02/01/22 229  lb 4 oz (104 kg)    Body mass index is 38.97 kg/m.  Performance status (ECOG): 1 - Symptomatic but completely ambulatory  PHYSICAL EXAM:  Physical Exam Constitutional:      Appearance: Normal appearance.  HENT:     Head: Normocephalic and atraumatic.     Nose: Nose normal.     Mouth/Throat:     Pharynx: Oropharynx is clear.  Eyes:     Extraocular Movements: Extraocular movements intact.     Conjunctiva/sclera: Conjunctivae normal.     Pupils: Pupils are equal, round, and reactive to light.  Cardiovascular:     Rate and Rhythm: Normal rate and regular rhythm.     Heart sounds: Normal heart sounds.  Pulmonary:     Breath sounds: Normal breath sounds.  Abdominal:     General: Bowel sounds are normal.     Palpations: Abdomen is soft.  Musculoskeletal:        General: Normal range of motion.     Cervical back: Normal range of motion.  Skin:    General: Skin is warm.  Neurological:     General: No focal deficit present.     Mental Status: She is alert and oriented to person, place, and time.  Psychiatric:        Mood and Affect: Mood normal.        Behavior: Behavior normal.        Thought Content: Thought content normal.        Judgment: Judgment normal.    LABS:      Latest Ref Rng & Units 03/01/2022   12:00 AM 02/01/2022   12:00 AM 01/19/2022    8:05 AM  CBC  WBC  13.1     12.5     10.1   Hemoglobin 12.0 - 16.0 9.5     9.6     9.1   Hematocrit 36 - 46 30     30     29.7   Platelets 150 - 400 K/uL 255     250     241      This result is from an external source.      Latest Ref Rng & Units 03/01/2022   12:00 AM 02/02/2022    3:36 PM 01/19/2022    8:05 AM  CMP  Glucose 70 - 99 mg/dL  245  132   BUN 4 - 21 72     61  57   Creatinine 0.5 - 1.1 2.3     2.14  2.27   Sodium 137 - 147 138     138  138   Potassium 3.5 - 5.1 mEq/L 3.8     4.5  4.0   Chloride 99 - 108 103     103  99   CO2 13 - '22 27     26  23   '$ Calcium 8.7 - 10.7 8.9     9.0  8.8   Total Protein 6.0  - 8.5 g/dL   7.1   Total Bilirubin 0.0 - 1.2 mg/dL   0.3   Alkaline Phos 25 - 125 126      140   AST 13 - 35 20      10   ALT 7 - 35 U/L 16      11      This result is from an external source.    Lab Results  Component Value Date   TOTALPROTELP 7.3 09/13/2021   ALBUMINELP 3.5 09/13/2021   A1GS 0.3 09/13/2021   A2GS 0.8 09/13/2021   BETS 1.0 09/13/2021   GAMS 1.8 09/13/2021   MSPIKE Not Observed 09/13/2021   Lab Results  Component Value Date   TIBC 333 12/07/2021   TIBC 328 09/13/2021   TIBC 271 08/25/2021   FERRITIN 34 12/07/2021   FERRITIN 64 09/13/2021   FERRITIN 106 08/25/2021   IRONPCTSAT 12 12/07/2021   IRONPCTSAT 12 09/13/2021   IRONPCTSAT 11 (L) 08/25/2021   Lab Results  Component Value Date   LDH 176 09/13/2021    STUDIES:  No results found.       I,Gabriella Ballesteros,acting as a scribe for Derwood Kaplan, MD.,have documented all relevant documentation on the behalf of Derwood Kaplan, MD,as directed by  Derwood Kaplan, MD while in the presence of Derwood Kaplan, MD.

## 2022-02-01 NOTE — Patient Instructions (Signed)
Epoetin Alfa injection ?What is this medication? ?EPOETIN ALFA (e POE e tin AL fa) helps your body make more red blood cells. This medicine is used to treat anemia caused by chronic kidney disease, cancer chemotherapy, or HIV-therapy. It may also be used before surgery if you have anemia. ?This medicine may be used for other purposes; ask your health care provider or pharmacist if you have questions. ?COMMON BRAND NAME(S): Epogen, Procrit, Retacrit ?What should I tell my care team before I take this medication? ?They need to know if you have any of these conditions: ?cancer ?heart disease ?high blood pressure ?history of blood clots ?history of stroke ?low levels of folate, iron, or vitamin B12 in the blood ?seizures ?an unusual or allergic reaction to erythropoietin, albumin, benzyl alcohol, hamster proteins, other medicines, foods, dyes, or preservatives ?pregnant or trying to get pregnant ?breast-feeding ?How should I use this medication? ?This medicine is for injection into a vein or under the skin. It is usually given by a health care professional in a hospital or clinic setting. ?If you get this medicine at home, you will be taught how to prepare and give this medicine. Use exactly as directed. Take your medicine at regular intervals. Do not take your medicine more often than directed. ?It is important that you put your used needles and syringes in a special sharps container. Do not put them in a trash can. If you do not have a sharps container, call your pharmacist or healthcare provider to get one. ?A special MedGuide will be given to you by the pharmacist with each prescription and refill. Be sure to read this information carefully each time. ?Talk to your pediatrician regarding the use of this medicine in children. While this drug may be prescribed for selected conditions, precautions do apply. ?Overdosage: If you think you have taken too much of this medicine contact a poison control center or emergency  room at once. ?NOTE: This medicine is only for you. Do not share this medicine with others. ?What if I miss a dose? ?If you miss a dose, take it as soon as you can. If it is almost time for your next dose, take only that dose. Do not take double or extra doses. ?What may interact with this medication? ?Interactions have not been studied. ?This list may not describe all possible interactions. Give your health care provider a list of all the medicines, herbs, non-prescription drugs, or dietary supplements you use. Also tell them if you smoke, drink alcohol, or use illegal drugs. Some items may interact with your medicine. ?What should I watch for while using this medication? ?Your condition will be monitored carefully while you are receiving this medicine. ?You may need blood work done while you are taking this medicine. ?This medicine may cause a decrease in vitamin B6. You should make sure that you get enough vitamin B6 while you are taking this medicine. Discuss the foods you eat and the vitamins you take with your health care professional. ?What side effects may I notice from receiving this medication? ?Side effects that you should report to your doctor or health care professional as soon as possible: ?allergic reactions like skin rash, itching or hives, swelling of the face, lips, or tongue ?seizures ?signs and symptoms of a blood clot such as breathing problems; changes in vision; chest pain; severe, sudden headache; pain, swelling, warmth in the leg; trouble speaking; sudden numbness or weakness of the face, arm or leg ?signs and symptoms of a stroke like   changes in vision; confusion; trouble speaking or understanding; severe headaches; sudden numbness or weakness of the face, arm or leg; trouble walking; dizziness; loss of balance or coordination ?Side effects that usually do not require medical attention (report to your doctor or health care professional if they continue or are  bothersome): ?chills ?cough ?dizziness ?fever ?headaches ?joint pain ?muscle cramps ?muscle pain ?nausea, vomiting ?pain, redness, or irritation at site where injected ?This list may not describe all possible side effects. Call your doctor for medical advice about side effects. You may report side effects to FDA at 1-800-FDA-1088. ?Where should I keep my medication? ?Keep out of the reach of children. ?Store in a refrigerator between 2 and 8 degrees C (36 and 46 degrees F). Do not freeze or shake. Throw away any unused portion if using a single-dose vial. Multi-dose vials can be kept in the refrigerator for up to 21 days after the initial dose. Throw away unused medicine. ?NOTE: This sheet is a summary. It may not cover all possible information. If you have questions about this medicine, talk to your doctor, pharmacist, or health care provider. ?? 2023 Elsevier/Gold Standard (2017-03-05 00:00:00) ? ?

## 2022-02-02 ENCOUNTER — Encounter (HOSPITAL_COMMUNITY): Payer: Self-pay | Admitting: Internal Medicine

## 2022-02-02 ENCOUNTER — Ambulatory Visit (HOSPITAL_COMMUNITY)
Admission: RE | Admit: 2022-02-02 | Discharge: 2022-02-02 | Disposition: A | Discharge status: Home / Self Care | Payer: HMO | Source: Ambulatory Visit | Attending: Internal Medicine | Admitting: Internal Medicine

## 2022-02-02 ENCOUNTER — Other Ambulatory Visit (HOSPITAL_COMMUNITY): Payer: Self-pay | Admitting: Family Medicine

## 2022-02-02 VITALS — BP 158/70 | HR 72 | Wt 231.0 lb

## 2022-02-02 DIAGNOSIS — E1022 Type 1 diabetes mellitus with diabetic chronic kidney disease: Secondary | ICD-10-CM

## 2022-02-02 DIAGNOSIS — Z794 Long term (current) use of insulin: Secondary | ICD-10-CM | POA: Insufficient documentation

## 2022-02-02 DIAGNOSIS — Z89412 Acquired absence of left great toe: Secondary | ICD-10-CM | POA: Diagnosis not present

## 2022-02-02 DIAGNOSIS — Z7982 Long term (current) use of aspirin: Secondary | ICD-10-CM | POA: Diagnosis not present

## 2022-02-02 DIAGNOSIS — I2510 Atherosclerotic heart disease of native coronary artery without angina pectoris: Secondary | ICD-10-CM | POA: Diagnosis not present

## 2022-02-02 DIAGNOSIS — Z87891 Personal history of nicotine dependence: Secondary | ICD-10-CM | POA: Insufficient documentation

## 2022-02-02 DIAGNOSIS — E1043 Type 1 diabetes mellitus with diabetic autonomic (poly)neuropathy: Secondary | ICD-10-CM | POA: Diagnosis not present

## 2022-02-02 DIAGNOSIS — Z8674 Personal history of sudden cardiac arrest: Secondary | ICD-10-CM | POA: Insufficient documentation

## 2022-02-02 DIAGNOSIS — D631 Anemia in chronic kidney disease: Secondary | ICD-10-CM | POA: Diagnosis not present

## 2022-02-02 DIAGNOSIS — G8929 Other chronic pain: Secondary | ICD-10-CM | POA: Diagnosis not present

## 2022-02-02 DIAGNOSIS — Z89422 Acquired absence of other left toe(s): Secondary | ICD-10-CM | POA: Diagnosis not present

## 2022-02-02 DIAGNOSIS — Z955 Presence of coronary angioplasty implant and graft: Secondary | ICD-10-CM | POA: Diagnosis not present

## 2022-02-02 DIAGNOSIS — E785 Hyperlipidemia, unspecified: Secondary | ICD-10-CM | POA: Insufficient documentation

## 2022-02-02 DIAGNOSIS — N184 Chronic kidney disease, stage 4 (severe): Secondary | ICD-10-CM

## 2022-02-02 DIAGNOSIS — R300 Dysuria: Secondary | ICD-10-CM | POA: Diagnosis not present

## 2022-02-02 DIAGNOSIS — I5032 Chronic diastolic (congestive) heart failure: Secondary | ICD-10-CM

## 2022-02-02 DIAGNOSIS — Z8249 Family history of ischemic heart disease and other diseases of the circulatory system: Secondary | ICD-10-CM | POA: Insufficient documentation

## 2022-02-02 DIAGNOSIS — G4733 Obstructive sleep apnea (adult) (pediatric): Secondary | ICD-10-CM | POA: Diagnosis not present

## 2022-02-02 DIAGNOSIS — R002 Palpitations: Secondary | ICD-10-CM

## 2022-02-02 DIAGNOSIS — I252 Old myocardial infarction: Secondary | ICD-10-CM | POA: Insufficient documentation

## 2022-02-02 DIAGNOSIS — Z7902 Long term (current) use of antithrombotics/antiplatelets: Secondary | ICD-10-CM | POA: Diagnosis not present

## 2022-02-02 DIAGNOSIS — M545 Low back pain, unspecified: Secondary | ICD-10-CM | POA: Diagnosis not present

## 2022-02-02 DIAGNOSIS — I130 Hypertensive heart and chronic kidney disease with heart failure and stage 1 through stage 4 chronic kidney disease, or unspecified chronic kidney disease: Secondary | ICD-10-CM | POA: Diagnosis not present

## 2022-02-02 DIAGNOSIS — Z79899 Other long term (current) drug therapy: Secondary | ICD-10-CM | POA: Diagnosis not present

## 2022-02-02 DIAGNOSIS — D6489 Other specified anemias: Secondary | ICD-10-CM | POA: Diagnosis not present

## 2022-02-02 DIAGNOSIS — N1832 Chronic kidney disease, stage 3b: Secondary | ICD-10-CM | POA: Diagnosis not present

## 2022-02-02 DIAGNOSIS — I340 Nonrheumatic mitral (valve) insufficiency: Secondary | ICD-10-CM

## 2022-02-02 DIAGNOSIS — Z833 Family history of diabetes mellitus: Secondary | ICD-10-CM | POA: Insufficient documentation

## 2022-02-02 LAB — URINALYSIS, ROUTINE W REFLEX MICROSCOPIC
Bilirubin Urine: NEGATIVE
Glucose, UA: NEGATIVE mg/dL
Hgb urine dipstick: NEGATIVE
Ketones, ur: NEGATIVE mg/dL
Nitrite: NEGATIVE
Protein, ur: NEGATIVE mg/dL
Specific Gravity, Urine: 1.008 (ref 1.005–1.030)
WBC, UA: >50 WBC/hpf — ABNORMAL HIGH (ref 0–5)
pH: 5 (ref 5.0–8.0)

## 2022-02-02 LAB — BASIC METABOLIC PANEL
Anion gap: 9 (ref 5–15)
BUN: 61 mg/dL — ABNORMAL HIGH (ref 6–20)
CO2: 26 mmol/L (ref 22–32)
Calcium: 9 mg/dL (ref 8.9–10.3)
Chloride: 103 mmol/L (ref 98–111)
Creatinine, Ser: 2.14 mg/dL — ABNORMAL HIGH (ref 0.44–1.00)
GFR, Estimated: 26 mL/min — ABNORMAL LOW (ref >60)
Glucose, Bld: 245 mg/dL — ABNORMAL HIGH (ref 70–99)
Potassium: 4.5 mmol/L (ref 3.5–5.1)
Sodium: 138 mmol/L (ref 135–145)

## 2022-02-02 MED ORDER — SULFAMETHOXAZOLE-TRIMETHOPRIM 400-80 MG PO TABS: 1.0000 | ORAL_TABLET | Freq: Two times a day (BID) | ORAL | 0 refills | Status: DC

## 2022-02-02 MED ORDER — HYDRALAZINE HCL 25 MG PO TABS: 25.0000 mg | ORAL_TABLET | Freq: Three times a day (TID) | ORAL | 6 refills | Status: DC

## 2022-02-02 NOTE — Progress Notes (Signed)
Advanced Heart Failure Clinic Note   PCP: [name], MD PCP-Cardiologist: [name], MD   Reason for Visit: f/u chronic diastolic HF  HPI: Dana [name] is 61 y.o. female with history of diastolic heart failure s/p cardiomems 02/2018, CAD, s/p DES RCA and LAD 2018, DMI, htn, hyperlipidemia, PAD, and OSA on CPAP, PAF s/p ablation, PAD s/p L great toe and 4th toe amputation, stage IIIb CKD.   Admitted 03/22 with NSTEMI and a/c CHF. LHC 03/22 showed moderate mid-LAD and mid-RCA disease, severe mid-L-Cx stenosis s/p PCI/DES mid Cx and side branch angioplasty of OM1.   Unfortunately she developed a contrast-induced nephropathy after cath and creatinine increased from 2.2 to a peak of 5.5. Additionally, she developed severe nausea and vomiting, gastroparesis and required IV antiplatelet therapy.  During this time she developed epistaxis and acute blood loss anemia requiring transfusion.  She was switched back to oral DAPT. Diuretics were reintroduced and her creatinine began to improve over the following days. Her leukocytosis persisted but all her infectious workup was negative and this was felt to be reactive.    Per chart review, she was admitted 6/22 to [facility] for DKA, started on insulin gtt, IVF and diuretics held. Developed pulmonary vascular congestion vs PNA and was started on IV abx, and diuretics were resumed. She suffered a PEA arrest 01/12/21 with ROSC after 10-15 minutes of CPR and was transferred to [place]; precipitating factor for PEA arrest thought to be respiratory driven. On arrival, she was still in DKA, AKI on CKD with oliguria and Nephrology was consulted for further management. TTE with EF 60%, min MR/TR. Concern for stroke due to left-sided weakness and MRI confirmed anoxic brain injury, but no definite infarct. She was extubated, SCr returned to baseline of 1.6-1.8 and she was discharged to rehab facility.   Zio placed 8/22 for palpitations, showing mostly SB/SR no afib, 66  SVT runs, frequent isolated SVE (11.2%).  Admitted from AHF clinic 10/22 for A/C CHF after failing outpatient escalation in diuretic therapy. She was enrolled in FASTR trial and diuresed > 15 lb.  Hospitalization complicated by AKI on CKD, Scr up to 3.13. RHC showed mild to moderate elevating filling pressures with large v-waves in PCWP tracing suggesting severe MR vs severe diastolic dysfunction. TEE 55-60% showed severe central MR.   Patient underwent TEER of mitral valve with placement of 1XT W and 1NT W clip on 05/18/2021. NT W clip was entangled and had to be deployed into lateral part of valve. No additional clip placement could be preformed. Echo 11/04 with EF 50-55% and residual moderate MR. Given 1 dose IV lasix prior to discharge.   Follow up 1/23 doing CR 3-4x/week. Volume mildly up, torsemide doubled x 2 days.   Echo 3/23 showed EF 55-60%, RV OK, moderate MR mean gradient 7-8 mmHg. Saw [name] and MR now moderate, no indication for 3rd clip.   Follow up 09/25/21, volume up. Increased torsemide to 60 bid x 2 days then 80 mg daily. Follow up 4/23, hydralazine decreased to 12.5 with ongoing dizziness.  Today she returns for HF follow up with her husband. Overall feeling great!. She has SOB if she walks further distances on flat ground, uses a WC when out of the house. Using Cardiomems. Got a new bird and enjoying taking care of him. Denies palpitations, abnormal bleeding, CP, dizziness, edema, or PND/Orthopnea. Appetite ok. No fever or chills. Weight at home 230 pounds. Taking all medications. Thinks she may have a UTI, urine  has a strong smell and dark color.  Cardiac Studies: - Echo (3/23): EF 55-60%, RV normal, moderate MR  - Echo 11/22: EF 50-55%, residual moderate MR  - RHC (10/22):   RA = 8 RV = 59/9 PA = 61/17 (35) PCW = mean 23 (v = 49) -> performed multiple wedges Fick cardiac output/index = PVR = 1.6 WU FA sat = 98% PA sat = 69%, 70% PaPi = 5.5 Assessment: 1. Mild  to moderately elevated filling pressures with very large v-waves in PCWP tracing suggestive of severe diastolic dysfunction vs severe MR  - TEE (10/22): EF 55-60%, basal inferior akinesis, severe central MR. - Echo (10/22): EF 65-70%, RV ok, moderate MR - Echo (5/21): EF 60-65%, RV ok, Grade II DD. - Echo (2/22): EF 55-60%, RV ok, Grade II DD, mod MR - Echo (3/22): EF 55-60%, Grade II DD, normal RV - Echo (6/22): EF 60%, mod MR/TR (per chart review at [facility]). - Zio (8/19): no arrhythmias, occasional PAC/PVCs - Zio (9/22): mostly SB/SR no afib, 66 SVT runs, frequent isolated SVE (11.2%).  - LHC (3/22): 3 vessel CAD with moderate mid-LAD and mid-RCA stenoses, and severe mid-LCx stenosis; successful PCI of the mid-circumflex, reducing severe 90% stenosis to 0% with a 2.75x22 mm Resolute Onyx DES, provisional side branch angioplasty of the OM1 with a 2.5 mm balloon through the stent struts  Review of systems complete and found to be negative unless listed in HPI.    Past Medical History:  Diagnosis Date   Acquired absence of left great toe (HCC)    Acquired absence of other left toe(s) ([place])    ACS (acute coronary syndrome) ([place])    Anemia    Anoxic brain injury ([place])    Anxiety    CAD (coronary artery disease)    DES mLAD 04/07/15; DES dRCA & DES pPDA 08/30/16; DES mCX & PTCA OM1 09/29/20   Chronic diastolic (congestive) heart failure (HCC)    Chronic pain    Chronic systolic (congestive) heart failure (HCC)    CKD (chronic kidney disease)    Contrast dye induced nephropathy, possible 09/03/2020   COPD (chronic obstructive pulmonary disease) (HCC)    Diabetes (HCC)    Diastolic CHF (HCC)    Dyspnea    Family history of breast cancer 10/23/2021   Gastro-esophageal reflux disease without esophagitis    Hyperlipidemia    Hypertension    Hypertensive heart disease with heart failure (HCC)    Hypoxemia    Idiopathic gout, multiple sites    Localized edema    Low back pain    Mitral  regurgitation    Mitral regurgitation    Mixed hyperlipidemia    Mixed incontinence    Morbid (severe) obesity due to excess calories (HCC)    Neuromuscular disorder (HCC)    Neuropathy    Non-ST elevation (NSTEMI) myocardial infarction ([place])    08/29/20, 09/29/20   OSA (obstructive sleep apnea) 09/03/2020   Other specified hypothyroidism    PAF (paroxysmal atrial fibrillation) (HCC)    s/p pulmonary vein isolation by cryoablation 10/04/15 Dr. Minna Merritts in [facility]   PEA (Pulseless electrical activity) [facility])    ~ 01/13/21 at [facility] during admission DKA, volume overload, possible CAP, hypoxia s/p ACLS with ROSC ~ 10-15   Pneumonia    Primary insomnia    Rheumatoid arthritis ([place])    Stroke ([place])    Thyroid disease    Type 2 diabetes mellitus with diabetic nephropathy ([place])  Vitamin D deficiency     Current Outpatient Medications  Medication Sig Dispense Refill   allopurinol (ZYLOPRIM) 300 MG tablet Take 1 tablet (300 mg total) by mouth daily. 90 tablet 1   amoxicillin (AMOXIL) 500 MG capsule TAKE FOUR CAPSULES BY MOUTH ONE HOUR BEFORE DENTAL PROCEDURES AND CLEANINGS     busPIRone (BUSPAR) 5 MG tablet TAKE ONE TABLET BY MOUTH THREE TIMES DAILY 270 tablet 1   carvedilol (COREG) 12.5 MG tablet TAKE ONE TABLET BY MOUTH TWICE DAILY 180 tablet 3   clopidogrel (PLAVIX) 75 MG tablet Take 1 tablet by mouth daily.     Cyanocobalamin 3000 MCG CAPS Take 2 capsules by mouth daily. Patient purchased 3000 mcg gummies and taking 2 gummies daily     EPINEPHrine 0.3 mg/0.3 mL IJ SOAJ injection Use as directed for life-threatening allergic reaction. 4 Device 3   ergocalciferol (VITAMIN D2) 1.25 MG (50000 UT) capsule Take by mouth once a week.     Evolocumab (REPATHA SURECLICK) 902 MG/ML SOAJ Inject 1 Dose into the skin every 14 (fourteen) days. 6 mL 1   ezetimibe (ZETIA) 10 MG tablet Take 1 tablet (10 mg total) by mouth daily. 90 tablet 3   hydrALAZINE (APRESOLINE) 25 MG tablet Take 0.5 tablets  (12.5 mg total) by mouth 3 (three) times daily. 45 tablet 3   HYDROcodone-acetaminophen (NORCO) 7.5-325 MG tablet TAKE ONE TABLET BY MOUTH three times daily AS NEEDED FOR moderate pain 90 tablet 0   insulin aspart (NOVOLOG FLEXPEN) 100 UNIT/ML FlexPen 2-10 U before meals based on SSI. 15 mL 3   Insulin Pen Needle (BD PEN NEEDLE NANO 2ND GEN) 32G X 4 MM MISC Inject 1 each into the skin in the morning, at noon, in the evening, and at bedtime. 600 each 3   isosorbide mononitrate (IMDUR) 30 MG 24 hr tablet Take 1 tablet by mouth daily.     lactulose (CHRONULAC) 10 GM/15ML solution Take 15 mLs (10 g total) by mouth daily as needed for mild constipation. 236 mL 1   latanoprost (XALATAN) 0.005 % ophthalmic solution Place 1 drop into both eyes at bedtime.     LEVEMIR FLEXTOUCH 100 UNIT/ML FlexTouch Pen INJECT 50 UNITS into THE SKIN DAILY (Patient taking differently: Inject 42 Units into the skin daily.) 15 mL 1   LORazepam (ATIVAN) 0.5 MG tablet TAKE ONE TABLET BY MOUTH ONCE DAILY AS NEEDED FOR ANXIETY 30 tablet 2   magnesium oxide (MAG-OX) 400 MG tablet Take 2 tablets (800 mg total) by mouth daily. Take 2 (400 mg) daily=800 mg 60 tablet 1   nitroGLYCERIN (NITROSTAT) 0.4 MG SL tablet Place 1 tablet (0.4 mg total) under the tongue every 5 (five) minutes as needed for chest pain. 25 tablet 3   Omega-3 Fatty Acids (FISH OIL PO) Take 1 capsule by mouth daily.     OXYGEN Inhale 2 L into the lungs at bedtime.     pantoprazole (PROTONIX) 40 MG tablet Take 1 tablet (40 mg total) by mouth daily. 90 tablet 1   potassium chloride SA (KLOR-CON M) 20 MEQ tablet Take 1 tablet (20 mEq total) by mouth daily. 90 tablet 1   Probiotic CAPS Take 1 capsule by mouth daily.     promethazine (PHENERGAN) 25 MG tablet Take 1 tablet (25 mg total) by mouth as needed. 30 tablet 3   torsemide (DEMADEX) 20 MG tablet Take 4 tablets (80 mg total) by mouth daily. 130 tablet 5   No current facility-administered medications for this  encounter.   Allergies  Allergen Reactions   Naproxen Hives and Rash   Celecoxib Other (See Comments)    Stomach pain   Aspirin Other (See Comments)    CAN NOT TAKE DUE TO ULCERS (documented previously) but takes every day currently (as of 09/29/20) without reported issues Can take coated aspirin   Glucosamine Hives, Swelling and Other (See Comments)    Angioedema    Iron     IV iron transfusion - hives - Feb 2022 hospitalization   Metoclopramide Other (See Comments)    Facial twitching and stuttering   Metolazone Other (See Comments)    Acute renal failure   Other Hives    Strawberry allergy   Shellfish-Derived Products Hives, Swelling and Other (See Comments)    Angioedema    Statins Other (See Comments)    Muscle pain   Strawberry (Diagnostic)    Strawberry Extract    Strawberry Flavor    Strawberry Leaves Extract    Tramadol Nausea And Vomiting        Social History   Socioeconomic History   Marital status: Married    Spouse name: Not on file   Number of children: 2   Years of education: 16   Highest education level: Master's degree (e.g., MA, Dana, MEng, MEd, MSW, MBA)  Occupational History   Occupation: on disability/retired early former Education officer, museum  Tobacco Use   Smoking status: Former    Types: Cigarettes    Start date: 1983    Quit date: 1984    Years since quitting: 39.5   Smokeless tobacco: Never  Vaping Use   Vaping Use: Never used  Substance and Sexual Activity   Alcohol use: Never   Drug use: Never   Sexual activity: Not on file  Other Topics Concern   Not on file  Social History Narrative   Not on file   Social Determinants of Health   Financial Resource Strain: Low Risk  (12/21/2021)   Overall Financial Resource Strain (CARDIA)    Difficulty of Paying Living Expenses: Not hard at all  Food Insecurity: No Food Insecurity (04/27/2021)   Hunger Vital Sign    Worried About Running Out of Food in the Last Year: Never true    Ran Out of  Food in the Last Year: Never true  Transportation Needs: No Transportation Needs (12/21/2021)   PRAPARE - Hydrologist (Medical): No    Lack of Transportation (Non-Medical): No  Physical Activity: Insufficiently Active (02/15/2020)   Exercise Vital Sign    Days of Exercise per Week: 3 days    Minutes of Exercise per Session: 40 min  Stress: Not on file  Social Connections: Not on file  Intimate Partner Violence: Not on file   Family History  Problem Relation Age of Onset   Breast cancer Mother 27       contralateral breast ca dx 75   Coronary artery disease Mother    Hypertension Mother    Hyperlipidemia Mother    Diabetes type II Mother    Lung cancer Mother 61   Diabetes Father    Hypertension Father    Mental illness Sister    Bipolar disorder Other    Depression Other    Schizophrenia Other    Cancer Other        MGF's sisters x2; unknown type   BP (!) 158/70   Pulse 72   Wt 104.8 kg (231 lb)   SpO2 96%  BMI 39.04 kg/m   Wt Readings from Last 3 Encounters:  02/02/22 104.8 kg (231 lb)  02/01/22 104 kg (229 lb 4 oz)  02/01/22 104.6 kg (230 lb 9.6 oz)   PHYSICAL EXAM: General:  NAD. No resp difficulty, arrived in [facility] HEENT: Normal Neck: Supple. No JVD. Carotids 2+ bilat; no bruits. No lymphadenopathy or thryomegaly appreciated. Cor: PMI nondisplaced. Regular rate & rhythm. No rubs, gallops or murmurs. Lungs: Clear, diminished in bases. Abdomen: Obese, soft, nontender, nondistended. No hepatosplenomegaly. No bruits or masses. Good bowel sounds. Extremities: No cyanosis, clubbing, rash, edema; compression hose on. Neuro: Alert & oriented x 3, cranial nerves grossly intact. Moves all 4 extremities w/o difficulty. Affect pleasant.   CardioMEMs: Goal dPAP: 10 on 01/30/22  ASSESSMENT & PLAN: 1.  Chronic Diastolic Heart Failure - Echo 08/2020 EF 55-60%  - Echo (3/22): EF 55-60%, Grade II DD, normal RV - Echo (6/22): EF 60%, mod MR/TR (per  chart review at [place]). - RHC (10/22): mild to moderate elevated filling pressures, large v-waves PCWP tracing - TEE (10/22): EF 55-60%, basal inferior akinesis, severe central MR. - Admit 10/22 with a/c CHF, enrolled in FASTR trial and diuresed 15 lbs. Severe MR likely contributing to decompensation.  - Echo (11/22): EF 50-55%, severe LV HK, moderate MR - Improved NYHA II-early III, confounded by anemia. Volume OK exam and by Cardiomems. - Increase hydralazine back to 25 mg tid. I asked her to call if dizziness recurs. - Continue torsemide 80 mg daily + 20 KCL daily. - Continue carvedilol 12.5 mg bid. - Continue Imdur 30 mg daily. - No SGLT2i with T1DM. - Continue TED hose. - Labs today.   2. CAD - Known CAD with prior stents to mid LAD and pRCA.  - NSTEMI (2/22). Cath deferred due to AKI. Treated medically.  - NSTEMI (7/22)-->S/p PCI/DES to mid Cx, balloon angioplasty to OM1  - No s/s angina. - Ideally continue ASA + Plavix for a minimum of 12 months post DES  - Lipids under good control w/ Repatha + Zetia (LDL 66 7/22). Followed by Dr. Debara Pickett.    3. CKD IIIb - Prior h/o CIN following LHC - Previous SCr baseline 1.6-1.8 with multiple AKIs over the last year. New baseline ~ 2.4  - Followed by Nephrology in [place] today.  4. Mitral regurgitation - Severe central MR on TEE on 10/22.   - S/p TEER 05/18/2021 - two clips placed, one became entangled and had to be deployed. - Moderate residual MR on echo 11/22. - Repeat echo 12/22 with stable moderate MR, echo 3/23 showed moderate MR. - Structural team following. No indication for re-clip, per patient report.   5. OSA - CPAP qhs.    6. DM1 - Hgb A1c 6.6 10/22. - No SGLT2i.   7. Chronic Anemia - Baseline 8-9, felt to be 2/2 renal disease.  - Hx severe epistaxis and ABLA requiring transfusion. Hx rash with IV iron. - Following with Heme/Onc. Receiving Retacrit monthly.  8. Palpitations - Hx PAF s/p ablation at  least 10 years ago. No documented recurrence. Not currently on anticoagulation. Would need to consider risk/benefits of anticoagulation if recurs especially with history of anemia requiring transfusion - Zio XT 14 day (8/22) showed mostly SB/SR no afib, 66 SVT runs, frequent isolated SVE (11.2%). - Symptomatically improved.  - Continue carvedilol 12.5 mg bid.  9. Dysuria - No fever or chills. - + chronic low back pain. - Check u/a & culture  Follow up in 3-4 months with Dr. Joyce Gross, FNP-BC 3:19 PM

## 2022-02-02 NOTE — Patient Instructions (Signed)
Medication Changes:  Increase Hydralazine to 25 mg (1 tab) Three times a day   Lab Work:  Labs done today, your results will be available in MyChart, we will contact you for abnormal readings.  Testing/Procedures:  none  Referrals:  none  Special Instructions // Education:  Do the following things EVERYDAY: Weigh yourself in the morning before breakfast. Write it down and keep it in a log. Take your medicines as prescribed Eat low salt foods--Limit salt (sodium) to 2000 mg per day.  Stay as active as you can everyday Limit all fluids for the day to less than 2 liters Continue doing Cardiomems reading daily  Follow-Up in: 3 months  At the [facility], you and your health needs are our priority. We have a designated team specialized in the treatment of Heart Failure. This Care Team includes your primary Heart Failure Specialized Cardiologist (physician), Advanced Practice Providers (APPs- Physician Assistants and Nurse Practitioners), and Pharmacist who all work together to provide you with the care you need, when you need it.   You may see any of the following providers on your designated Care Team at your next follow up:  Dr [name] Dr Haynes Kerns, NP Lyda Jester, [place] [facility] [place], [place] Audry Riles, PharmD   Please be sure to bring in all your medications bottles to every appointment.   Need to Contact us:  If you have any questions or concerns before your next appointment please send [name] a message through [place] or call our office at [phone].    TO LEAVE A MESSAGE FOR THE NURSE SELECT OPTION 2, PLEASE LEAVE A MESSAGE INCLUDING: YOUR NAME DATE OF BIRTH CALL BACK NUMBER REASON FOR CALL**this is important as we prioritize the call backs  YOU WILL RECEIVE A CALL BACK THE SAME DAY AS LONG AS YOU CALL BEFORE 4:00 PM

## 2022-02-07 ENCOUNTER — Encounter: Payer: Self-pay | Admitting: Oncology

## 2022-02-19 ENCOUNTER — Other Ambulatory Visit: Payer: Self-pay | Admitting: Family Medicine

## 2022-02-20 DIAGNOSIS — F411 Generalized anxiety disorder: Secondary | ICD-10-CM | POA: Diagnosis not present

## 2022-02-22 ENCOUNTER — Encounter: Payer: Self-pay | Admitting: Oncology

## 2022-02-26 ENCOUNTER — Other Ambulatory Visit: Payer: Self-pay | Admitting: Family Medicine

## 2022-02-26 ENCOUNTER — Telehealth: Payer: Self-pay

## 2022-02-26 NOTE — Progress Notes (Signed)
Care Gap(s) Not Met that Need to be Addressed:   Eye Exam for Patients With Diabetes  Hemoglobin A1c Control for Patients With Diabetes   Action Taken: Updated care gap list with Messaged PCP to get pt in for a DM exam and A1C 01/19/22 8.2. Repeat labs on 04/25/22   Follow Up:

## 2022-02-27 ENCOUNTER — Other Ambulatory Visit: Payer: Self-pay

## 2022-03-01 ENCOUNTER — Inpatient Hospital Stay: Payer: HMO

## 2022-03-01 ENCOUNTER — Other Ambulatory Visit: Payer: Self-pay | Admitting: Pharmacist

## 2022-03-01 ENCOUNTER — Telehealth: Payer: Self-pay | Admitting: Hematology and Oncology

## 2022-03-01 ENCOUNTER — Encounter: Payer: Self-pay | Admitting: Hematology and Oncology

## 2022-03-01 ENCOUNTER — Inpatient Hospital Stay: Payer: HMO | Attending: Hematology and Oncology | Admitting: Hematology and Oncology

## 2022-03-01 DIAGNOSIS — D631 Anemia in chronic kidney disease: Secondary | ICD-10-CM

## 2022-03-01 DIAGNOSIS — N184 Chronic kidney disease, stage 4 (severe): Secondary | ICD-10-CM | POA: Insufficient documentation

## 2022-03-01 DIAGNOSIS — Z79899 Other long term (current) drug therapy: Secondary | ICD-10-CM | POA: Diagnosis not present

## 2022-03-01 LAB — HEPATIC FUNCTION PANEL
ALT: 16 U/L (ref 7–35)
AST: 20 (ref 13–35)
Alkaline Phosphatase: 126 — AB (ref 25–125)
Bilirubin, Total: 0.5

## 2022-03-01 LAB — CBC AND DIFFERENTIAL
HCT: 30 — AB (ref 36–46)
Hemoglobin: 9.5 — AB (ref 12.0–16.0)
Neutrophils Absolute: 10.09
Platelets: 255 10*3/uL (ref 150–400)
WBC: 13.1

## 2022-03-01 LAB — BASIC METABOLIC PANEL
BUN: 72 — AB (ref 4–21)
CO2: 27 — AB (ref 13–22)
Chloride: 103 (ref 99–108)
Creatinine: 2.3 — AB (ref 0.5–1.1)
Glucose: 206
Potassium: 3.8 mEq/L (ref 3.5–5.1)
Sodium: 138 (ref 137–147)

## 2022-03-01 LAB — CBC: RBC: 3.54 — AB (ref 3.87–5.11)

## 2022-03-01 LAB — COMPREHENSIVE METABOLIC PANEL
Albumin: 4 (ref 3.5–5.0)
Calcium: 8.9 (ref 8.7–10.7)

## 2022-03-01 MED ORDER — EPOETIN ALFA-EPBX 20000 UNIT/ML IJ SOLN
20000.0000 [IU] | Freq: Once | INTRAMUSCULAR | Status: AC
  Administered 2022-03-01: 20000 [IU] via SUBCUTANEOUS
  Filled 2022-03-01: qty 1

## 2022-03-01 NOTE — Progress Notes (Signed)
Patient Care Team: [name], MD as PCP - General (Internal Medicine) Bensimhon, Shaune Pascal, MD as PCP - Cardiology (Cardiology) Kennith Gain, MD as Consulting Physician (Allergy) Bensimhon, Shaune Pascal, MD as Consulting Physician (Cardiology) Scherrie November, MD as Referring Physician (Gastroenterology) Alanda Slim Neena Rhymes, MD as Consulting Physician (Ophthalmology) Katheran James., MD as Consulting Physician (Endocrinology) Sharyn Dross., DPM as Consulting Physician (Podiatry) Hennie Duos, MD as Consulting Physician (Rheumatology) Biagio Quint, MD as Referring Physician (Nephrology) Lane Hacker, [facility] as Pharmacist (Pharmacist) Lane Hacker, [facility] (Pharmacist) Early Osmond, MD as Consulting Physician (Cardiology) Derwood Kaplan, MD as Consulting Physician (Oncology) Charlaine Dalton, MD as Referring Physician Katheran James., MD (Endocrinology)  Clinic Day:  03/01/2022  Referring physician: [name], MD  ASSESSMENT & PLAN:   Assessment & Plan: Anemia due to stage 4 chronic kidney disease ([place]) Chronic anemia that has been worsening.  This is anemia of chronic kidney disease/anemia of chronic disease.  Repeat iron studies in March did not reveal iron deficiency, soluble transferrin receptor was normal. No hemolysis was found.  Serum protein electrophoresis is negative for a monoclonal spike. We therefore recommended that she increase the Retacrit to every 2 weeks. Stool Hemoccults were negative.  Her hemoglobin today is 9.5.   The patient understands the plans discussed today and is in agreement with them.  She knows to contact our office if she develops concerns prior to her next appointment.    Melodye Ped, NP  [facility] [address] [place] [address] Dept: [phone] Dept Fax: [phone]   Orders Placed This Encounter  Procedures    CBC and differential    This external order was created through the Results Console.   CBC    This external order was created through the Results Console.   Basic metabolic panel    This external order was created through the Results Console.   Comprehensive metabolic panel    This external order was created through the Results Console.   Hepatic function panel    This external order was created through the Results Console.      CHIEF COMPLAINT:  CC: A 61 year old female with history of anemia here for 4 week evaluation  Current Treatment:  Retacrit  INTERVAL HISTORY:  Dana [name] is here today for repeat clinical assessment. She denies fevers or chills. She denies pain. Her appetite is good. Her weight has been stable.  I have reviewed the past medical history, past surgical history, social history and family history with the patient and they are unchanged from previous note.  ALLERGIES:  is allergic to naproxen, celecoxib, aspirin, glucosamine, iron, metoclopramide, metolazone, other, shellfish-derived products, statins, strawberry (diagnostic), strawberry extract, strawberry flavor, strawberry leaves extract, and tramadol.  MEDICATIONS:  Current Outpatient Medications  Medication Sig Dispense Refill   allopurinol (ZYLOPRIM) 300 MG tablet Take 1 tablet (300 mg total) by mouth daily. 90 tablet 1   amoxicillin (AMOXIL) 500 MG capsule TAKE FOUR CAPSULES BY MOUTH ONE HOUR BEFORE DENTAL PROCEDURES AND CLEANINGS     busPIRone (BUSPAR) 5 MG tablet TAKE ONE TABLET BY MOUTH THREE TIMES DAILY 270 tablet 1   carvedilol (COREG) 12.5 MG tablet TAKE ONE TABLET BY MOUTH TWICE DAILY 180 tablet 3   clopidogrel (PLAVIX) 75 MG tablet Take 1 tablet by mouth daily.     Continuous Blood Gluc Sensor (FREESTYLE LIBRE 2 SENSOR) MISC USE TO  check blood glucose AS DIRECTED AND CHANGE sensor every 14 DAYS     Cyanocobalamin 3000 MCG CAPS Take 2 capsules by mouth daily. Patient purchased 3000 mcg gummies and taking  2 gummies daily     EPINEPHrine 0.3 mg/0.3 mL IJ SOAJ injection Use as directed for life-threatening allergic reaction. 4 Device 3   ergocalciferol (VITAMIN D2) 1.25 MG (50000 UT) capsule Take by mouth once a week.     Evolocumab (REPATHA SURECLICK) 539 MG/ML SOAJ Inject 1 Dose into the skin every 14 (fourteen) days. 6 mL 1   ezetimibe (ZETIA) 10 MG tablet Take 1 tablet (10 mg total) by mouth daily. 90 tablet 3   hydrALAZINE (APRESOLINE) 25 MG tablet Take 1 tablet (25 mg total) by mouth 3 (three) times daily. 90 tablet 6   HYDROcodone-acetaminophen (NORCO) 7.5-325 MG tablet TAKE ONE TABLET BY MOUTH three times daily AS NEEDED FOR moderate pain 90 tablet 0   insulin aspart (NOVOLOG FLEXPEN) 100 UNIT/ML FlexPen 2-10 U before meals based on SSI. 15 mL 3   Insulin Pen Needle (BD PEN NEEDLE NANO 2ND GEN) 32G X 4 MM MISC Inject 1 each into the skin in the morning, at noon, in the evening, and at bedtime. 600 each 3   isosorbide mononitrate (IMDUR) 30 MG 24 hr tablet Take 1 tablet by mouth daily.     lactulose (CHRONULAC) 10 GM/15ML solution Take 15 mLs (10 g total) by mouth daily as needed for mild constipation. 236 mL 1   latanoprost (XALATAN) 0.005 % ophthalmic solution Place 1 drop into both eyes at bedtime.     LEVEMIR FLEXPEN 100 UNIT/ML FlexPen inejct 50 units into THE SKIN daily 15 mL 1   LORazepam (ATIVAN) 0.5 MG tablet TAKE ONE TABLET BY MOUTH ONCE DAILY AS NEEDED FOR ANXIETY 30 tablet 2   magnesium oxide (MAG-OX) 400 MG tablet Take 2 tablets (800 mg total) by mouth daily. Take 2 (400 mg) daily=800 mg 60 tablet 1   nitroGLYCERIN (NITROSTAT) 0.4 MG SL tablet Place 1 tablet (0.4 mg total) under the tongue every 5 (five) minutes as needed for chest pain. 25 tablet 3   Omega-3 Fatty Acids (FISH OIL PO) Take 1 capsule by mouth daily.     OXYGEN Inhale 2 L into the lungs at bedtime.     pantoprazole (PROTONIX) 40 MG tablet Take 1 tablet (40 mg total) by mouth daily. 90 tablet 1   Potassium Chloride  ER 20 MEQ TBCR Take 1 tablet by mouth daily.     Probiotic CAPS Take 1 capsule by mouth daily.     promethazine (PHENERGAN) 25 MG tablet Take 1 tablet (25 mg total) by mouth as needed. 30 tablet 3   torsemide (DEMADEX) 20 MG tablet Take 4 tablets (80 mg total) by mouth daily. 130 tablet 5   No current facility-administered medications for this visit.    HISTORY OF PRESENT ILLNESS:   Oncology History   No history exists.      REVIEW OF SYSTEMS:   Constitutional: Denies fevers, chills or abnormal weight loss Eyes: Denies blurriness of vision Ears, nose, mouth, throat, and face: Denies mucositis or sore throat Respiratory: Denies cough, dyspnea or wheezes Cardiovascular: Denies palpitation, chest discomfort or lower extremity swelling Gastrointestinal:  Denies nausea, heartburn or change in bowel habits Skin: Denies abnormal skin rashes Lymphatics: Denies new lymphadenopathy or easy bruising Neurological:Denies numbness, tingling or new weaknesses Behavioral/Psych: Mood is stable, no new changes  All other systems were reviewed with  the patient and are negative.   VITALS:  Blood pressure (!) 117/56, pulse 72, temperature 97.8 F (36.6 C), temperature source Oral, resp. rate 18, height 5' 4.5" (1.638 m), weight 229 lb 6.4 oz (104.1 kg), SpO2 95 %.  Wt Readings from Last 3 Encounters:  03/01/22 229 lb 6.4 oz (104.1 kg)  02/02/22 231 lb (104.8 kg)  02/01/22 229 lb 4 oz (104 kg)    Body mass index is 38.77 kg/m.  Performance status (ECOG): 1 - Symptomatic but completely ambulatory  PHYSICAL EXAM:   GENERAL:alert, no distress and comfortable SKIN: skin color, texture, turgor are normal, no rashes or significant lesions EYES: normal, Conjunctiva are pink and non-injected, sclera clear OROPHARYNX:no exudate, no erythema and lips, buccal mucosa, and tongue normal  NECK: supple, thyroid normal size, non-tender, without nodularity LYMPH:  no palpable lymphadenopathy in the  cervical, axillary or inguinal LUNGS: clear to auscultation and percussion with normal breathing effort HEART: regular rate & rhythm and no murmurs and no lower extremity edema ABDOMEN:abdomen soft, non-tender and normal bowel sounds Musculoskeletal:no cyanosis of digits and no clubbing  NEURO: alert & oriented x 3 with fluent speech, no focal motor/sensory deficits  LABORATORY DATA:  I have reviewed the data as listed    Component Value Date/Time   NA 138 03/01/2022 0000   K 3.8 03/01/2022 0000   CL 103 03/01/2022 0000   CO2 27 (A) 03/01/2022 0000   GLUCOSE 245 (H) 02/02/2022 1536   BUN 72 (A) 03/01/2022 0000   CREATININE 2.3 (A) 03/01/2022 0000   CREATININE 2.14 (H) 02/02/2022 1536   CALCIUM 8.9 03/01/2022 0000   PROT 7.1 01/19/2022 0805   ALBUMIN 4.0 03/01/2022 0000   ALBUMIN 3.8 01/19/2022 0805   AST 20 03/01/2022 0000   ALT 16 03/01/2022 0000   ALKPHOS 126 (A) 03/01/2022 0000   BILITOT 0.3 01/19/2022 0805   GFRNONAA 26 (L) 02/02/2022 1536   GFRAA 43 (L) 06/28/2020 1003    Lab Results  Component Value Date   SPEP Comment 09/13/2021    Lab Results  Component Value Date   WBC 13.1 03/01/2022   NEUTROABS 10.09 03/01/2022   HGB 9.5 (A) 03/01/2022   HCT 30 (A) 03/01/2022   MCV 85 01/19/2022   PLT 255 03/01/2022      Chemistry      Component Value Date/Time   NA 138 03/01/2022 0000   K 3.8 03/01/2022 0000   CL 103 03/01/2022 0000   CO2 27 (A) 03/01/2022 0000   BUN 72 (A) 03/01/2022 0000   CREATININE 2.3 (A) 03/01/2022 0000   CREATININE 2.14 (H) 02/02/2022 1536   GLU 206 03/01/2022 0000      Component Value Date/Time   CALCIUM 8.9 03/01/2022 0000   ALKPHOS 126 (A) 03/01/2022 0000   AST 20 03/01/2022 0000   ALT 16 03/01/2022 0000   BILITOT 0.3 01/19/2022 0805       RADIOGRAPHIC STUDIES: I have personally reviewed the radiological images as listed and agreed with the findings in the report. No results found.

## 2022-03-01 NOTE — Patient Instructions (Signed)

## 2022-03-01 NOTE — Telephone Encounter (Signed)
Per 03/01/22 los next appt scheduled and confirmed with patient 

## 2022-03-01 NOTE — Progress Notes (Signed)
Called pt today at CPP request to get a weeks worth of BS readings:  03/01/22  Fasting 152 , 139,  After Meal 266  02/1622 After Meal 302, after insulin 67, 92  02/27/22  Fasting 140, 153 After Meal 204, 230, 187 Bedtime 161  02/26/22 After Meal 191, 182 Bedtime 219  02/25/22  After meal 243 Bedtime 69  02/24/22 Bedtime 291  02/23/22 After meal 244   Takes insulin usually at 9 am 12:30pm and 5:30pm. Pt has been running back and forth to [place] due to a death in the family and it has thrown her off her schedule with insulin.    Elray Mcgregor, [place] Pharmacist Assistant  [phone]

## 2022-03-01 NOTE — Assessment & Plan Note (Addendum)
Chronic anemia that has been worsening. This is anemia of chronic kidney disease/anemia of chronic disease. Repeat iron studies in March did not reveal iron deficiency, soluble transferrin receptor was normal. No hemolysis was found.  Serum protein electrophoresis is negative for a monoclonal spike. We therefore recommended that she increase the Retacrit to every 2 weeks. StoolHemoccults were negative.  Her hemoglobin today is 9.5.

## 2022-03-02 ENCOUNTER — Encounter: Payer: Self-pay | Admitting: Oncology

## 2022-03-07 ENCOUNTER — Ambulatory Visit (INDEPENDENT_AMBULATORY_CARE_PROVIDER_SITE_OTHER): Payer: HMO

## 2022-03-07 VITALS — BP 118/60 | HR 88 | Resp 16 | Ht 64.5 in | Wt 227.8 lb

## 2022-03-07 DIAGNOSIS — Z0000 Encounter for general adult medical examination without abnormal findings: Secondary | ICD-10-CM | POA: Diagnosis not present

## 2022-03-07 DIAGNOSIS — Z23 Encounter for immunization: Secondary | ICD-10-CM

## 2022-03-07 DIAGNOSIS — N390 Urinary tract infection, site not specified: Secondary | ICD-10-CM

## 2022-03-07 DIAGNOSIS — R300 Dysuria: Secondary | ICD-10-CM | POA: Diagnosis not present

## 2022-03-07 DIAGNOSIS — Z1231 Encounter for screening mammogram for malignant neoplasm of breast: Secondary | ICD-10-CM

## 2022-03-07 DIAGNOSIS — Z803 Family history of malignant neoplasm of breast: Secondary | ICD-10-CM

## 2022-03-07 LAB — POCT URINALYSIS DIP (CLINITEK)
Bilirubin, UA: NEGATIVE
Blood, UA: NEGATIVE
Glucose, UA: NEGATIVE mg/dL
Ketones, POC UA: NEGATIVE mg/dL
Nitrite, UA: NEGATIVE
POC PROTEIN,UA: NEGATIVE
Spec Grav, UA: 1.015 (ref 1.010–1.025)
Urobilinogen, UA: 0.2 E.U./dL
pH, UA: 5 (ref 5.0–8.0)

## 2022-03-07 MED ORDER — CIPROFLOXACIN HCL 250 MG PO TABS: 250.0000 mg | ORAL_TABLET | Freq: Two times a day (BID) | ORAL | 0 refills | Status: AC

## 2022-03-07 NOTE — Progress Notes (Signed)
Subjective:   Dana [name] is a 60 y.o. female who presents for Medicare Annual (Subsequent) preventive examination.  This wellness visit is conducted by a nurse.  The patient's medications were reviewed and reconciled since the patient's last visit.  History details were provided by the patient.  The history appears to be reliable.    Medical History: Patient history and Family history was reviewed  Medications, Allergies, and preventative health maintenance was reviewed and updated.  Patient recently treated for UTI with Bactrim 80/400 one BID x 3 days, she continues to have symptoms (strong odor, cloudy, occasional burning). --Deferred to Dr Tobie Poet  Patient reports increase of pain and stiffness in BL hands.  She is awaiting an appointment with Dr Estanislado Pandy, first available was in January 2024.  Patient reports she is unable to close her hands making it difficult to do daily activities involving using her grip.  --Deferred to Dr Tobie Poet  Cardiac Risk Factors include: diabetes mellitus;obesity (BMI >30kg/m2)     Objective:    Today's Vitals   03/07/22 1010  BP: 118/60  Pulse: 88  Resp: 16  SpO2: 95%  Weight: 227 lb 12.8 oz (103.3 kg)  Height: 5' 4.5" (1.638 m)  PainSc: 8    Body mass index is 38.5 kg/m.     12/25/2021    2:27 PM 09/13/2021    2:21 PM 05/18/2021    9:00 PM 05/16/2021    1:07 PM 04/28/2021    2:06 PM 04/25/2021    6:26 AM 09/30/2020    5:00 AM  Advanced Directives  Does Patient Have a Medical Advance Directive? No No No No No No Yes  Type of Advance Directive       Living will  Does patient want to make changes to medical advance directive?       No - Patient declined  Would patient like information on creating a medical advance directive? No - Patient declined  No - Patient declined No - Patient declined No - Patient declined Yes (Inpatient - patient requests chaplain consult to create a medical advance directive)     Current Medications (verified) Outpatient  Encounter Medications as of 03/07/2022  Medication Sig   allopurinol (ZYLOPRIM) 300 MG tablet Take 1 tablet (300 mg total) by mouth daily.   amoxicillin (AMOXIL) 500 MG capsule TAKE FOUR CAPSULES BY MOUTH ONE HOUR BEFORE DENTAL PROCEDURES AND CLEANINGS   busPIRone (BUSPAR) 5 MG tablet TAKE ONE TABLET BY MOUTH THREE TIMES DAILY   carvedilol (COREG) 12.5 MG tablet TAKE ONE TABLET BY MOUTH TWICE DAILY   clopidogrel (PLAVIX) 75 MG tablet Take 1 tablet by mouth daily.   Continuous Blood Gluc Sensor (FREESTYLE LIBRE 2 SENSOR) MISC USE TO check blood glucose AS DIRECTED AND CHANGE sensor every 14 DAYS   Cyanocobalamin 3000 MCG CAPS Take 2 capsules by mouth daily. Patient purchased 3000 mcg gummies and taking 2 gummies daily   EPINEPHrine 0.3 mg/0.3 mL IJ SOAJ injection Use as directed for life-threatening allergic reaction.   ergocalciferol (VITAMIN D2) 1.25 MG (50000 UT) capsule Take by mouth once a week.   Evolocumab (REPATHA SURECLICK) 270 MG/ML SOAJ Inject 1 Dose into the skin every 14 (fourteen) days.   ezetimibe (ZETIA) 10 MG tablet Take 1 tablet (10 mg total) by mouth daily.   hydrALAZINE (APRESOLINE) 25 MG tablet Take 1 tablet (25 mg total) by mouth 3 (three) times daily.   HYDROcodone-acetaminophen (NORCO) 7.5-325 MG tablet TAKE ONE TABLET BY MOUTH three times daily  AS NEEDED FOR moderate pain   insulin aspart (NOVOLOG FLEXPEN) 100 UNIT/ML FlexPen 2-10 U before meals based on SSI.   Insulin Pen Needle (BD PEN NEEDLE NANO 2ND GEN) 32G X 4 MM MISC Inject 1 each into the skin in the morning, at noon, in the evening, and at bedtime.   isosorbide mononitrate (IMDUR) 30 MG 24 hr tablet Take 1 tablet by mouth daily.   lactulose (CHRONULAC) 10 GM/15ML solution Take 15 mLs (10 g total) by mouth daily as needed for mild constipation.   latanoprost (XALATAN) 0.005 % ophthalmic solution Place 1 drop into both eyes at bedtime.   LEVEMIR FLEXPEN 100 UNIT/ML FlexPen inejct 50 units into THE SKIN daily    LORazepam (ATIVAN) 0.5 MG tablet TAKE ONE TABLET BY MOUTH ONCE DAILY AS NEEDED FOR ANXIETY   magnesium oxide (MAG-OX) 400 MG tablet Take 2 tablets (800 mg total) by mouth daily. Take 2 (400 mg) daily=800 mg   nitroGLYCERIN (NITROSTAT) 0.4 MG SL tablet Place 1 tablet (0.4 mg total) under the tongue every 5 (five) minutes as needed for chest pain.   Omega-3 Fatty Acids (FISH OIL PO) Take 1 capsule by mouth daily.   OXYGEN Inhale 2 L into the lungs at bedtime.   pantoprazole (PROTONIX) 40 MG tablet Take 1 tablet (40 mg total) by mouth daily.   Potassium Chloride ER 20 MEQ TBCR Take 1 tablet by mouth daily.   Probiotic CAPS Take 1 capsule by mouth daily.   promethazine (PHENERGAN) 25 MG tablet Take 1 tablet (25 mg total) by mouth as needed.   torsemide (DEMADEX) 20 MG tablet Take 4 tablets (80 mg total) by mouth daily.   No facility-administered encounter medications on file as of 03/07/2022.    Allergies (verified) Naproxen, Celecoxib, Aspirin, Glucosamine, Iron, Metoclopramide, Metolazone, Other, Shellfish-derived products, Statins, Strawberry (diagnostic), Strawberry extract, Strawberry flavor, Strawberry leaves extract, and Tramadol   History: Past Medical History:  Diagnosis Date   Acquired absence of left great toe (HCC)    Acquired absence of other left toe(s) ([place])    ACS (acute coronary syndrome) ([place])    Anemia    Anoxic brain injury ([place])    Anxiety    CAD (coronary artery disease)    DES mLAD 04/07/15; DES dRCA & DES pPDA 08/30/16; DES mCX & PTCA OM1 09/29/20   Chronic diastolic (congestive) heart failure (HCC)    Chronic pain    Chronic systolic (congestive) heart failure (HCC)    CKD (chronic kidney disease)    Contrast dye induced nephropathy, possible 09/03/2020   COPD (chronic obstructive pulmonary disease) (HCC)    Diabetes (HCC)    Diastolic CHF ([place])    Dyspnea    Family history of breast cancer 10/23/2021   Gastro-esophageal reflux disease without esophagitis     Hyperlipidemia    Hypertension    Hypertensive heart disease with heart failure (HCC)    Hypoxemia    Idiopathic gout, multiple sites    Localized edema    Low back pain    Mitral regurgitation    Mitral regurgitation    Mixed hyperlipidemia    Mixed incontinence    Morbid (severe) obesity due to excess calories (HCC)    Neuromuscular disorder (HCC)    Neuropathy    Non-ST elevation (NSTEMI) myocardial infarction ([place])    08/29/20, 09/29/20   OSA (obstructive sleep apnea) 09/03/2020   Other specified hypothyroidism    PAF (paroxysmal atrial fibrillation) (HCC)    s/p pulmonary vein  isolation by cryoablation 10/04/15 Dr. Minna Merritts in [facility]   PEA (Pulseless electrical activity) [facility])    ~ 01/13/21 at [facility] during admission DKA, volume overload, possible CAP, hypoxia s/p ACLS with ROSC ~ 10-15   Pneumonia    Primary insomnia    Rheumatoid arthritis ([place])    Stroke ([place])    Thyroid disease    Type 2 diabetes mellitus with diabetic nephropathy ([place])    Vitamin D deficiency    Past Surgical History:  Procedure Laterality Date   ABDOMINAL HYSTERECTOMY     Partial- Still has both ovaries   CARDIOVASCULAR STRESS TEST  08/13/2014   Nuclear; Normal   CARPAL TUNNEL RELEASE     CATARACT EXTRACTION, BILATERAL     CESAREAN SECTION     CHOLECYSTECTOMY     CORONARY ANGIOPLASTY WITH STENT PLACEMENT     3 blockages/ 1 stent   CORONARY STENT INTERVENTION N/A 09/29/2020   Procedure: CORONARY STENT INTERVENTION;  Surgeon: Sherren Mocha, MD;  Location: [place] [facility];  Service: Cardiovascular;  Laterality: N/A;  CFX   EYE SURGERY     LEFT HEART CATH AND CORONARY ANGIOGRAPHY N/A 09/29/2020   Procedure: LEFT HEART CATH AND CORONARY ANGIOGRAPHY;  Surgeon: Sherren Mocha, MD;  Location: [place] [facility];  Service: Cardiovascular;  Laterality: N/A;   MITRAL VALVE REPAIR N/A 05/18/2021   Procedure: MITRAL VALVE REPAIR;  Surgeon: Early Osmond, MD;  Location: [place] [facility];   Service: Cardiovascular;  Laterality: N/A;   RIGHT HEART CATH N/A 04/27/2021   Procedure: RIGHT HEART CATH;  Surgeon: Jolaine Artist, MD;  Location: [place] [facility];  Service: Cardiovascular;  Laterality: N/A;   RIGHT/LEFT HEART CATH AND CORONARY ANGIOGRAPHY N/A 01/07/2017   Procedure: Right/Left Heart Cath and Coronary Angiography;  Surgeon: Larey Dresser, MD;  Location: [place] [facility];  Service: Cardiovascular;  Laterality: N/A;   ROTATOR CUFF REPAIR     TEE WITHOUT CARDIOVERSION N/A 04/28/2021   Procedure: TRANSESOPHAGEAL ECHOCARDIOGRAM (TEE);  Surgeon: Jolaine Artist, MD;  Location: [facility] ENDOSCOPY;  Service: Cardiovascular;  Laterality: N/A;   TEE WITHOUT CARDIOVERSION N/A 05/18/2021   Procedure: TRANSESOPHAGEAL ECHOCARDIOGRAM (TEE);  Surgeon: Early Osmond, MD;  Location: [place] [facility];  Service: Cardiovascular;  Laterality: N/A;   TOE AMPUTATION Left    US ECHOCARDIOGRAPHY  05/2017   Normal   Family History  Problem Relation Age of Onset   Breast cancer Mother 70       contralateral breast ca dx 80   Coronary artery disease Mother    Hypertension Mother    Hyperlipidemia Mother    Diabetes type II Mother    Lung cancer Mother 11   Diabetes Father    Hypertension Father    Mental illness Sister    Bipolar disorder Other    Depression Other    Schizophrenia Other    Cancer Other        MGF's sisters x2; unknown type   Social History   Socioeconomic History   Marital status: Married    Spouse name: [name]   Number of children: 2   Years of education: 39   Highest education level: Conservator, museum/gallery (e.g., MA, MS, MEng, MEd, MSW, MBA)  Occupational History   Occupation: on disability/retired early former Education officer, museum  Tobacco Use   Smoking status: Former    Types: Cigarettes    Start date: 1983    Quit date: 1984    Years  since quitting: 39.6   Smokeless tobacco: Never  Vaping Use   Vaping Use: Never used  Substance and Sexual Activity    Alcohol use: Never   Drug use: Never   Sexual activity: Not on file  Other Topics Concern   Not on file  Social History Narrative   Not on file   Social Determinants of Health   Financial Resource Strain: Low Risk  (12/21/2021)   Overall Financial Resource Strain (CARDIA)    Difficulty of Paying Living Expenses: Not hard at all  Food Insecurity: No Food Insecurity (04/27/2021)   Hunger Vital Sign    Worried About Running Out of Food in the Last Year: Never true    Ran Out of Food in the Last Year: Never true  Transportation Needs: No Transportation Needs (12/21/2021)   PRAPARE - Hydrologist (Medical): No    Lack of Transportation (Non-Medical): No  Physical Activity: Insufficiently Active (02/15/2020)   Exercise Vital Sign    Days of Exercise per Week: 3 days    Minutes of Exercise per Session: 40 min  Stress: Not on file  Social Connections: Not on file    Tobacco Counseling Counseling given: No tobacco used by patient   Clinical Intake:  Pre-visit preparation completed: Yes Pain : 0-10 Pain Score: 8  Pain Type: Chronic pain Pain Location: Other (Comment) (Back and bilateral hands) Pain Frequency: Constant Effect of Pain on Daily Activities: moderate   BMI - recorded: 38.5 Nutritional Status: BMI > 30  Obese Nutritional Risks: None Diabetes: Yes (last A1C 8.2) CBG done?: No Did pt. bring in CBG monitor from home?: No How often do you need to have someone help you when you read instructions, pamphlets, or other written materials from your doctor or pharmacy?: 1 - Never Interpreter Needed?: No    Activities of Daily Living    03/07/2022   11:19 AM 05/18/2021    9:00 PM  In your present state of health, do you have any difficulty performing the following activities:  Hearing? 0 0  Vision? 0 0  Difficulty concentrating or making decisions? 0 0  Walking or climbing stairs? 1 1  Dressing or bathing? 0 0  Doing errands, shopping? 0 0   Preparing Food and eating ? Y   Using the Toilet? N   In the past six months, have you accidently leaked urine? N   Do you have problems with loss of bowel control? N   Managing your Medications? N   Managing your Finances? N   Housekeeping or managing your Housekeeping? Y     Patient Care Team: [name], MD as PCP - General (Internal Medicine) Bensimhon, Shaune Pascal, MD as PCP - Cardiology (Cardiology) Kennith Gain, MD as Consulting Physician (Allergy) Bensimhon, Shaune Pascal, MD as Consulting Physician (Cardiology) Scherrie November, MD as Referring Physician (Gastroenterology) Alanda Slim, Neena Rhymes, MD as Consulting Physician (Ophthalmology) Katheran James., MD as Consulting Physician (Endocrinology) Sharyn Dross., DPM as Consulting Physician (Podiatry) Hennie Duos, MD as Consulting Physician (Rheumatology) Biagio Quint, MD as Referring Physician (Nephrology) Lane Hacker, [facility] as Pharmacist (Pharmacist) Lane Hacker, [facility] (Pharmacist) Early Osmond, MD as Consulting Physician (Cardiology) Derwood Kaplan, MD as Consulting Physician (Oncology) Charlaine Dalton, MD as Referring Physician Katheran James., MD (Endocrinology)     Assessment:   This is a routine wellness examination for [place].  Hearing/Vision screen Eye exam records requested from [facility]  Dietary issues and exercise activities discussed: Current Exercise Habits: Home exercise routine, Type of exercise: Other - see comments (chair exercises), Time (Minutes): 15, Frequency (Times/Week): 5, Weekly Exercise (Minutes/Week): 75, Intensity: Mild, Exercise limited by: Other - see comments (pain)  Depression Screen    03/07/2022   11:18 AM 12/21/2021   10:29 AM 10/10/2021    7:41 AM 05/09/2021   10:57 AM 02/13/2021    2:04 PM 02/13/2021    2:03 PM 10/17/2020    8:00 AM  PHQ 2/9 Scores  PHQ - 2 Score '2 2 4 '$ 0 0 0 0  PHQ- 9 Score '8 9 13  5  3    '$ Fall Risk     03/07/2022   11:17 AM 01/19/2022    7:27 AM 05/09/2021   10:57 AM 10/17/2020    7:59 AM 06/28/2020   10:35 AM  Fall Risk   Falls in the past year? 1 1 0 0 1  Number falls in past yr: 1 0 0 0 1  Injury with Fall? 0 0 0 0 1  Comment     bruising  Risk for fall due to : Impaired balance/gait;History of fall(s) Impaired mobility Impaired balance/gait  Other (Comment)  Risk for fall due to: Comment     Neuropathy  Follow up Falls evaluation completed;Education provided Falls evaluation completed Falls evaluation completed      FALL RISK PREVENTION PERTAINING TO THE HOME:  Any stairs in or around the home? Yes  If so, are there any without handrails? No  Home free of loose throw rugs in walkways, pet beds, electrical cords, etc? Yes  Adequate lighting in your home to reduce risk of falls? Yes   ASSISTIVE DEVICES UTILIZED TO PREVENT FALLS:  Life alert? No  Use of a cane, walker or w/c? Yes  Grab bars in the bathroom? Yes  Shower chair or bench in shower? Yes  Elevated toilet seat or a handicapped toilet? No   Gait slow and steady with assistive device  Cognitive Function:        06/13/2020   11:07 AM  6CIT Screen  What Year? 0 points  What month? 0 points  What time? 0 points  Count back from 20 0 points  Months in reverse 0 points  Repeat phrase 0 points  Total Score 0 points    Immunizations Immunization History  Administered Date(s) Administered   Influenza Inj Mdck Quad Pf 03/23/2020, 04/04/2021   Influenza, Quadrivalent, Recombinant, Inj, Pf 05/21/2018   Influenza,inj,Quad PF,6+ Mos 03/28/2015, 03/07/2022   Influenza-Unspecified 04/15/2014, 05/16/2016, 03/12/2019   Moderna Covid-19 Vaccine Bivalent Booster 97yr & up 05/09/2021   Moderna SARS-COV2 Booster Vaccination 06/28/2020   Moderna Sars-Covid-2 Vaccination 09/30/2019, 10/28/2019   Pneumococcal Conjugate-13 05/16/2014, 06/14/2014   Pneumococcal Polysaccharide-23 05/18/2013    TDAP status: Due, Education  has been provided regarding the importance of this vaccine. Advised may receive this vaccine at local pharmacy or Health Dept. Aware to provide a copy of the vaccination record if obtained from local pharmacy or Health Dept. Verbalized acceptance and understanding.  Flu Vaccine status: Completed at today's visit  Pneumococcal vaccine status: Up to date  Covid-19 vaccine status: Information provided on how to obtain vaccines.   Qualifies for Shingles Vaccine? Yes   Zostavax completed No   Shingrix Completed?: Yes  Screening Tests Health Maintenance  Topic Date Due   TETANUS/TDAP  Never done   Zoster Vaccines- Shingrix (1 of 2) Never done   MAMMOGRAM  09/24/2020  OPHTHALMOLOGY EXAM  06/06/2021   COVID-19 Vaccine (4 - Moderna risk series) 07/04/2021   FOOT EXAM  07/11/2022   HEMOGLOBIN A1C  07/22/2022   COLONOSCOPY (Pts 45-65yr Insurance coverage will need to be confirmed)  04/03/2026   INFLUENZA VACCINE  Completed   Hepatitis C Screening  Completed   HIV Screening  Completed   HPV VACCINES  Aged Out   PAP SMEAR-Modifier  Discontinued    Health Maintenance  Health Maintenance Due  Topic Date Due   TETANUS/TDAP  Never done   Zoster Vaccines- Shingrix (1 of 2) Never done   MAMMOGRAM  09/24/2020   OPHTHALMOLOGY EXAM  06/06/2021   COVID-19 Vaccine (4 - Moderna risk series) 07/04/2021    Colorectal cancer screening: Type of screening: Colonoscopy. Completed [id number] Repeat every 10 years  Mammogram status: Scheduled  Lung Cancer Screening: (Low Dose CT Chest recommended if Age 912-80years, 30 pack-year currently smoking OR have quit w/in 15years.) does not qualify.   Additional Screening:  Vision Screening: Recommended annual ophthalmology exams for early detection of glaucoma and other disorders of the eye. Is the patient up to date with their annual eye exam?  Yes  Who is the provider or what is the name of the office in which the patient attends annual eye exams? [facility](records requested)  Dental Screening: Recommended annual dental exams for proper oral hygiene    Plan:    1- Shingrix Vaccine and Tdap - to get at pharmacy 2- UA done today showing 2+ LEU (not enough urine to culture) - patient recently treated with Bactrim for UTI, previously discussed with Dr about referral to urology for frequent UTI's, would like to go forward with referral -- Deferred to Dr Cox 3- Worsening of symptoms - now bilateral hands painful and stiff - impairing daily tasks requiring grip -- Deferred to Dr Cox 4- [facility]  I have personally reviewed and noted the following in the patient's chart:   Medical and social history Use of alcohol, tobacco or illicit drugs  Current medications and supplements including opioid prescriptions.  Functional ability and status Nutritional status Physical activity Advanced directives List of other physicians Hospitalizations, surgeries, and ER visits in previous 12 months Vitals Screenings to include cognitive, depression, and falls Referrals and appointments  In addition, I have reviewed and discussed with patient certain preventive protocols, quality metrics, and best practice recommendations. A written personalized care plan for preventive services as well as general preventive health recommendations were provided to patient.     KErie Noe LPN   [id number]

## 2022-03-07 NOTE — Patient Instructions (Signed)
[name] , Thank you for taking time to come for your Medicare Wellness Visit. I appreciate your ongoing commitment to your health goals. Please review the following plan we discussed and let me know if I can assist you in the future.   Screening recommendations/referrals: Colonoscopy: Due 2027 Mammogram: Scheduled for August 30 Recommended yearly ophthalmology/optometry visit for glaucoma screening and checkup Recommended yearly dental visit for hygiene and checkup  Vaccinations: Influenza vaccine: Completed today Pneumococcal vaccine: up-to date Tdap vaccine: due - discussed getting at the pharmacy Shingles vaccine: due - discussed getting at the pharmacy     Preventive Care 40-64 Years, Female Preventive care refers to lifestyle choices and visits with your health care provider that can promote health and wellness. What does preventive care include? A yearly physical exam. This is also called an annual well check. Dental exams once or twice a year. Routine eye exams. Ask your health care provider how often you should have your eyes checked. Personal lifestyle choices, including: Daily care of your teeth and gums. Regular physical activity. Eating a healthy diet. Avoiding tobacco and drug use. Limiting alcohol use. Practicing safe sex. Taking low-dose aspirin daily starting at age 42. Taking vitamin and mineral supplements as recommended by your health care provider. What happens during an annual well check? The services and screenings done by your health care provider during your annual well check will depend on your age, overall health, lifestyle risk factors, and family history of disease. Counseling  Your health care provider may ask you questions about your: Alcohol use. Tobacco use. Drug use. Emotional well-being. Home and relationship well-being. Sexual activity. Eating habits. Work and work Statistician. Method of birth control. Menstrual cycle. Pregnancy  history. Screening  You may have the following tests or measurements: Height, weight, and BMI. Blood pressure. Lipid and cholesterol levels. These may be checked every 5 years, or more frequently if you are over 32 years old. Skin check. Lung cancer screening. You may have this screening every year starting at age 19 if you have a 30-pack-year history of smoking and currently smoke or have quit within the past 15 years. Fecal occult blood test (FOBT) of the stool. You may have this test every year starting at age 29. Flexible sigmoidoscopy or colonoscopy. You may have a sigmoidoscopy every 5 years or a colonoscopy every 10 years starting at age 30. Hepatitis C blood test. Hepatitis B blood test. Sexually transmitted disease (STD) testing. Diabetes screening. This is done by checking your blood sugar (glucose) after you have not eaten for a while (fasting). You may have this done every 1-3 years. Mammogram. This may be done every 1-2 years. Talk to your health care provider about when you should start having regular mammograms. This may depend on whether you have a family history of breast cancer. BRCA-related cancer screening. This may be done if you have a family history of breast, ovarian, tubal, or peritoneal cancers. Pelvic exam and Pap test. This may be done every 3 years starting at age 48. Starting at age 81, this may be done every 5 years if you have a Pap test in combination with an HPV test. Bone density scan. This is done to screen for osteoporosis. You may have this scan if you are at high risk for osteoporosis. Discuss your test results, treatment options, and if necessary, the need for more tests with your health care provider. Vaccines  Your health care provider may recommend certain vaccines, such as: Influenza vaccine. This  is recommended every year. Tetanus, diphtheria, and acellular pertussis (Tdap, Td) vaccine. You may need a Td booster every 10 years. Zoster vaccine. You  may need this after age 69. Pneumococcal 13-valent conjugate (PCV13) vaccine. You may need this if you have certain conditions and were not previously vaccinated. Pneumococcal polysaccharide (PPSV23) vaccine. You may need one or two doses if you smoke cigarettes or if you have certain conditions. Talk to your health care provider about which screenings and vaccines you need and how often you need them. This information is not intended to replace advice given to you by your health care provider. Make sure you discuss any questions you have with your health care provider. Document Released: 07/29/2015 Document Revised: 03/21/2016 Document Reviewed: 05/03/2015 Elsevier Interactive Patient Education  2017 [employer] Prevention in the Home Falls can cause injuries. They can happen to people of all ages. There are many things you can do to make your home safe and to help prevent falls. What can I do on the outside of my home? Regularly fix the edges of walkways and driveways and fix any cracks. Remove anything that might make you trip as you walk through a door, such as a raised step or threshold. Trim any bushes or trees on the path to your home. Use bright outdoor lighting. Clear any walking paths of anything that might make someone trip, such as rocks or tools. Regularly check to see if handrails are loose or broken. Make sure that both sides of any steps have handrails. Any raised decks and porches should have guardrails on the edges. Have any leaves, snow, or ice cleared regularly. Use sand or salt on walking paths during winter. Clean up any spills in your garage right away. This includes oil or grease spills. What can I do in the bathroom? Use night lights. Install grab bars by the toilet and in the tub and shower. Do not use towel bars as grab bars. Use non-skid mats or decals in the tub or shower. If you need to sit down in the shower, use a plastic, non-slip stool. Keep  the floor dry. Clean up any water that spills on the floor as soon as it happens. Remove soap buildup in the tub or shower regularly. Attach bath mats securely with double-sided non-slip rug tape. Do not have throw rugs and other things on the floor that can make you trip. What can I do in the bedroom? Use night lights. Make sure that you have a light by your bed that is easy to reach. Do not use any sheets or blankets that are too big for your bed. They should not hang down onto the floor. Have a firm chair that has side arms. You can use this for support while you get dressed. Do not have throw rugs and other things on the floor that can make you trip. What can I do in the kitchen? Clean up any spills right away. Avoid walking on wet floors. Keep items that you use a lot in easy-to-reach places. If you need to reach something above you, use a strong step stool that has a grab bar. Keep electrical cords out of the way. Do not use floor polish or wax that makes floors slippery. If you must use wax, use non-skid floor wax. Do not have throw rugs and other things on the floor that can make you trip. What can I do with my stairs? Do not leave any items on the  stairs. Make sure that there are handrails on both sides of the stairs and use them. Fix handrails that are broken or loose. Make sure that handrails are as long as the stairways. Check any carpeting to make sure that it is firmly attached to the stairs. Fix any carpet that is loose or worn. Avoid having throw rugs at the top or bottom of the stairs. If you do have throw rugs, attach them to the floor with carpet tape. Make sure that you have a light switch at the top of the stairs and the bottom of the stairs. If you do not have them, ask someone to add them for you. What else can I do to help prevent falls? Wear shoes that: Do not have high heels. Have rubber bottoms. Are comfortable and fit you well. Are closed at the toe. Do not  wear sandals. If you use a stepladder: Make sure that it is fully opened. Do not climb a closed stepladder. Make sure that both sides of the stepladder are locked into place. Ask someone to hold it for you, if possible. Clearly mark and make sure that you can see: Any grab bars or handrails. First and last steps. Where the edge of each step is. Use tools that help you move around (mobility aids) if they are needed. These include: Canes. Walkers. Scooters. Crutches. Turn on the lights when you go into a dark area. Replace any light bulbs as soon as they burn out. Set up your furniture so you have a clear path. Avoid moving your furniture around. If any of your floors are uneven, fix them. If there are any pets around you, be aware of where they are. Review your medicines with your doctor. Some medicines can make you feel dizzy. This can increase your chance of falling. Ask your doctor what other things that you can do to help prevent falls. This information is not intended to replace advice given to you by your health care provider. Make sure you discuss any questions you have with your health care provider. Document Released: 04/28/2009 Document Revised: 12/08/2015 Document Reviewed: 08/06/2014 Elsevier Interactive Patient Education  2017 [employer].

## 2022-03-07 NOTE — Progress Notes (Signed)
Per Dr Tobie Poet - for UTI: Cipro 250 BID x 7 days and refer to [facility] - referral entered and ABT sent to [place] per patient  Dr Tobie Poet suggested sending Rheumatology referral to new provider at [facility] - patient declined as she has had a bad experience at that office and doesn't want to go back there.

## 2022-03-07 NOTE — Addendum Note (Signed)
Addended by: Erie Noe on: 03/07/2022 01:54 PM   Modules accepted: Orders

## 2022-03-14 ENCOUNTER — Ambulatory Visit
Admission: RE | Admit: 2022-03-14 | Discharge: 2022-03-14 | Disposition: A | Discharge status: Home / Self Care | Payer: HMO | Source: Ambulatory Visit | Attending: Family Medicine | Admitting: Family Medicine

## 2022-03-14 DIAGNOSIS — Z803 Family history of malignant neoplasm of breast: Secondary | ICD-10-CM

## 2022-03-14 DIAGNOSIS — Z1231 Encounter for screening mammogram for malignant neoplasm of breast: Secondary | ICD-10-CM

## 2022-03-14 NOTE — Research (Addendum)
Vitals signs for this visit was completed on 05/08/2021 @ 1408

## 2022-03-15 ENCOUNTER — Inpatient Hospital Stay: Payer: HMO

## 2022-03-15 VITALS — BP 126/56 | HR 77 | Temp 97.9°F | Resp 18 | Ht 64.5 in | Wt 233.0 lb

## 2022-03-15 DIAGNOSIS — D631 Anemia in chronic kidney disease: Secondary | ICD-10-CM

## 2022-03-15 DIAGNOSIS — N184 Chronic kidney disease, stage 4 (severe): Secondary | ICD-10-CM | POA: Diagnosis not present

## 2022-03-15 MED ORDER — EPOETIN ALFA-EPBX 20000 UNIT/ML IJ SOLN
20000.0000 [IU] | Freq: Once | INTRAMUSCULAR | Status: AC
  Administered 2022-03-15: 20000 [IU] via SUBCUTANEOUS
  Filled 2022-03-15: qty 1

## 2022-03-15 NOTE — Patient Instructions (Signed)

## 2022-03-20 DIAGNOSIS — Z89422 Acquired absence of other left toe(s): Secondary | ICD-10-CM | POA: Diagnosis not present

## 2022-03-20 DIAGNOSIS — I110 Hypertensive heart disease with heart failure: Secondary | ICD-10-CM | POA: Diagnosis not present

## 2022-03-20 DIAGNOSIS — I509 Heart failure, unspecified: Secondary | ICD-10-CM | POA: Diagnosis not present

## 2022-03-20 DIAGNOSIS — F411 Generalized anxiety disorder: Secondary | ICD-10-CM | POA: Diagnosis not present

## 2022-03-20 DIAGNOSIS — Z794 Long term (current) use of insulin: Secondary | ICD-10-CM | POA: Diagnosis not present

## 2022-03-20 DIAGNOSIS — Z515 Encounter for palliative care: Secondary | ICD-10-CM | POA: Diagnosis not present

## 2022-03-20 DIAGNOSIS — Z89412 Acquired absence of left great toe: Secondary | ICD-10-CM | POA: Diagnosis not present

## 2022-03-20 DIAGNOSIS — E1142 Type 2 diabetes mellitus with diabetic polyneuropathy: Secondary | ICD-10-CM | POA: Diagnosis not present

## 2022-03-21 ENCOUNTER — Encounter: Payer: Self-pay | Admitting: Family Medicine

## 2022-03-26 ENCOUNTER — Telehealth: Payer: Self-pay

## 2022-03-26 NOTE — Chronic Care Management (AMB) (Signed)
Chronic Care Management Pharmacy Assistant   Name: Dana [name]  MRN: [id number] DOB: Apr 13, 1961  Reason for Encounter: Disease State/ Diabetes  Recent office visits:  03-07-2022 Erie Noe, LPN. Medicare wellness visit. Referral placed to Urology. Mammogram ordered. Abnormal UA. START Cipro 250 mg twice daily.  01-19-2022 Cox, Elnita Maxwell, MD. RBC= 3.49, Hemo= 9.1, Hema= 29.7, MCH= 26.1, MCHC= 30.6, RDW= 15.7, Neutrophils Absolute= 7.1. Glucose= 132, BUN= 57, Creatinine= 2.27, eGFR= 24, BUN/Creatinine Ratio= 25, Alkaline Phosphatase= 140. A1C= 8.2. Cholesterol= 83. Rhuematoid fact SerPl-aCnc= 223.1. Uric acid= 9.0. Referral placed to orthopedics. STOP ferrous sulfate.  Recent consult visits:  03-15-2022 Quentin Mulling, RN (Oncology). Epoetin injection given.  03-14-2022 Mammogram completed.  03-01-2022 Lenon Ahmadi, RN. (Oncology). Epoetin injection given.  03-01-2022 Melodye Ped, NP (Oncology). BUN= 72, CO2= 27, Creatinine= 2.3. Hemo= 9.5, HCT= 30. RBC= 3.54. Alkaline Phosphatase= 126. STOP aspirin and bactrim.  02-02-2022 Bensimhon, Shaune Pascal, MD (Cardiology). Glucose= 245, BUN= 61, Creatinine= 2.14, GFR estimated= 26.  02-01-2022 Emmit Alexanders, RN (Oncology). Epoetin injection given.  02-01-2022 Derwood Kaplan, MD (Oncology). Hemo= 9.6, HCT= 30. RBC= 3.6. STOP vitamin D.  01-25-2022 Leandrew Koyanagi, MD (Orthopedic surgery). XR Hand Complete Right completed. Referral placed to rheumatology.  01-18-2022 Lenon Ahmadi, RN (Oncology). Epoetin injection given.  Hospital visits:  None in previous 6 months  Medications: Outpatient Encounter Medications as of 03/26/2022  Medication Sig   allopurinol (ZYLOPRIM) 300 MG tablet Take 1 tablet (300 mg total) by mouth daily.   amoxicillin (AMOXIL) 500 MG capsule TAKE FOUR CAPSULES BY MOUTH ONE HOUR BEFORE DENTAL PROCEDURES AND CLEANINGS   busPIRone (BUSPAR) 5 MG tablet TAKE ONE TABLET BY MOUTH THREE TIMES  DAILY   carvedilol (COREG) 12.5 MG tablet TAKE ONE TABLET BY MOUTH TWICE DAILY   clopidogrel (PLAVIX) 75 MG tablet Take 1 tablet by mouth daily.   Continuous Blood Gluc Sensor (FREESTYLE LIBRE 2 SENSOR) MISC USE TO check blood glucose AS DIRECTED AND CHANGE sensor every 14 DAYS   Cyanocobalamin 3000 MCG CAPS Take 2 capsules by mouth daily. Patient purchased 3000 mcg gummies and taking 2 gummies daily   EPINEPHrine 0.3 mg/0.3 mL IJ SOAJ injection Use as directed for life-threatening allergic reaction.   ergocalciferol (VITAMIN D2) 1.25 MG (50000 UT) capsule Take by mouth once a week.   Evolocumab (REPATHA SURECLICK) 633 MG/ML SOAJ Inject 1 Dose into the skin every 14 (fourteen) days.   ezetimibe (ZETIA) 10 MG tablet Take 1 tablet (10 mg total) by mouth daily.   hydrALAZINE (APRESOLINE) 25 MG tablet Take 1 tablet (25 mg total) by mouth 3 (three) times daily.   HYDROcodone-acetaminophen (NORCO) 7.5-325 MG tablet TAKE ONE TABLET BY MOUTH three times daily AS NEEDED FOR moderate pain   insulin aspart (NOVOLOG FLEXPEN) 100 UNIT/ML FlexPen 2-10 U before meals based on SSI.   Insulin Pen Needle (BD PEN NEEDLE NANO 2ND GEN) 32G X 4 MM MISC Inject 1 each into the skin in the morning, at noon, in the evening, and at bedtime.   isosorbide mononitrate (IMDUR) 30 MG 24 hr tablet Take 1 tablet by mouth daily.   lactulose (CHRONULAC) 10 GM/15ML solution Take 15 mLs (10 g total) by mouth daily as needed for mild constipation.   latanoprost (XALATAN) 0.005 % ophthalmic solution Place 1 drop into both eyes at bedtime.   LEVEMIR FLEXPEN 100 UNIT/ML FlexPen inejct 50 units into THE SKIN daily   LORazepam (ATIVAN) 0.5 MG tablet TAKE  ONE TABLET BY MOUTH ONCE DAILY AS NEEDED FOR ANXIETY   magnesium oxide (MAG-OX) 400 MG tablet Take 2 tablets (800 mg total) by mouth daily. Take 2 (400 mg) daily=800 mg   nitroGLYCERIN (NITROSTAT) 0.4 MG SL tablet Place 1 tablet (0.4 mg total) under the tongue every 5 (five) minutes as  needed for chest pain.   Omega-3 Fatty Acids (FISH OIL PO) Take 1 capsule by mouth daily.   OXYGEN Inhale 2 L into the lungs at bedtime.   pantoprazole (PROTONIX) 40 MG tablet Take 1 tablet (40 mg total) by mouth daily.   Potassium Chloride ER 20 MEQ TBCR Take 1 tablet by mouth daily.   Probiotic CAPS Take 1 capsule by mouth daily.   promethazine (PHENERGAN) 25 MG tablet Take 1 tablet (25 mg total) by mouth as needed.   torsemide (DEMADEX) 20 MG tablet Take 4 tablets (80 mg total) by mouth daily.   No facility-administered encounter medications on file as of 03/26/2022.   Recent Relevant Labs: Lab Results  Component Value Date/Time   HGBA1C 8.2 (H) 01/19/2022 08:05 AM   HGBA1C 6.6 (H) 04/25/2021 12:20 AM    Kidney Function Lab Results  Component Value Date/Time   CREATININE 2.3 (A) 03/01/2022 12:00 AM   CREATININE 2.14 (H) 02/02/2022 03:36 PM   CREATININE 2.27 (H) 01/19/2022 08:05 AM   GFRNONAA 26 (L) 02/02/2022 03:36 PM   GFRAA 43 (L) 06/28/2020 10:03 AM     Current antihyperglycemic regimen:  Levemir 50 units daily (patient stated 44 units) Novolog 2-10 units tid before meals    Patient verbally confirms she is taking the above medications as directed. Yes  What recent interventions/DTPs have been made to improve glycemic control:  Educated on A1c and blood sugar goals; Exercise goal of 150 minutes per week; Prevention and management of hypoglycemic episodes; Carbohydrate counting and/or plate method -Counseled to check feet daily and get yearly eye exams -Counseled on diet and exercise extensively  Have there been any recent hospitalizations or ED visits since last visit with CPP? No  Patient denies hypoglycemic symptoms  Patient denies hyperglycemic symptoms  How often are you checking your blood sugar? 3-4 times daily  What are your blood sugars ranging?  Fasting: 09-15 97, 09-14 93, 09-13 96, 09-12 86 Before meals: 09-14 132, 09-13 149, 09-12 162 After meals:  09-14 161, 09-13 163, 09-12 170 Bedtime: 09-14 182, 09-13 175, 09-12 173  On insulin? Yes How many units: 2-10 units  During the week, how often does your blood glucose drop below 70? Never  Are you checking your feet daily/regularly? Yes  Adherence Review: Is the patient currently on a STATIN medication? No Is the patient currently on ACE/ARB medication? No Does the patient have >5 day gap between last estimated fill dates? No   Care Gaps: Last eye exam / Retinopathy Screening? 11-13-2021 Last Annual Wellness Visit? 03-07-2022 Last Diabetic Foot Exam? Patient stated Dr. Tobie Poet and endocrinology examines feet   Star Rating Drugs: None  Jeannette How [facility] Clinical Pharmacist Assistant [phone]

## 2022-03-29 ENCOUNTER — Inpatient Hospital Stay: Payer: HMO | Attending: Hematology and Oncology | Admitting: Hematology and Oncology

## 2022-03-29 ENCOUNTER — Telehealth: Payer: Self-pay | Admitting: Hematology and Oncology

## 2022-03-29 ENCOUNTER — Other Ambulatory Visit: Payer: Self-pay | Admitting: Family Medicine

## 2022-03-29 ENCOUNTER — Inpatient Hospital Stay: Payer: HMO

## 2022-03-29 ENCOUNTER — Encounter: Payer: Self-pay | Admitting: Hematology and Oncology

## 2022-03-29 VITALS — BP 147/67 | HR 68 | Temp 97.9°F | Resp 20 | Ht 64.8 in | Wt 228.3 lb

## 2022-03-29 DIAGNOSIS — N189 Chronic kidney disease, unspecified: Secondary | ICD-10-CM

## 2022-03-29 DIAGNOSIS — Z79899 Other long term (current) drug therapy: Secondary | ICD-10-CM | POA: Insufficient documentation

## 2022-03-29 DIAGNOSIS — D72829 Elevated white blood cell count, unspecified: Secondary | ICD-10-CM

## 2022-03-29 DIAGNOSIS — D631 Anemia in chronic kidney disease: Secondary | ICD-10-CM | POA: Diagnosis not present

## 2022-03-29 DIAGNOSIS — N1832 Chronic kidney disease, stage 3b: Secondary | ICD-10-CM | POA: Diagnosis not present

## 2022-03-29 DIAGNOSIS — N184 Chronic kidney disease, stage 4 (severe): Secondary | ICD-10-CM | POA: Diagnosis not present

## 2022-03-29 DIAGNOSIS — D649 Anemia, unspecified: Secondary | ICD-10-CM | POA: Diagnosis not present

## 2022-03-29 HISTORY — DX: Elevated white blood cell count, unspecified: D72.829

## 2022-03-29 LAB — HEPATIC FUNCTION PANEL
ALT: 21 U/L (ref 7–35)
AST: 29 (ref 13–35)
Alkaline Phosphatase: 154 — AB (ref 25–125)
Bilirubin, Total: 0.5

## 2022-03-29 LAB — COMPREHENSIVE METABOLIC PANEL
Albumin: 3.9 (ref 3.5–5.0)
Calcium: 8.9 (ref 8.7–10.7)

## 2022-03-29 LAB — BASIC METABOLIC PANEL
BUN: 63 — AB (ref 4–21)
CO2: 27 — AB (ref 13–22)
CO2: 27 — AB (ref 13–22)
Chloride: 103 (ref 99–108)
Creatinine: 2 — AB (ref 0.5–1.1)
Glucose: 246
Potassium: 3.7 mEq/L (ref 3.5–5.1)
Sodium: 138 (ref 137–147)

## 2022-03-29 LAB — VITAMIN B12: Vitamin B-12: 596 pg/mL (ref 180–914)

## 2022-03-29 LAB — CBC AND DIFFERENTIAL
HCT: 31 — AB (ref 36–46)
Hemoglobin: 9.9 — AB (ref 12.0–16.0)
Neutrophils Absolute: 8.45
Platelets: 278 10*3/uL (ref 150–400)
WBC: 11.9

## 2022-03-29 LAB — IRON AND TIBC
Iron: 52 ug/dL (ref 28–170)
Saturation Ratios: 20 % (ref 10.4–31.8)
TIBC: 263 ug/dL (ref 250–450)
UIBC: 211 ug/dL

## 2022-03-29 LAB — CBC: RBC: 3.65 — AB (ref 3.87–5.11)

## 2022-03-29 LAB — FERRITIN: Ferritin: 141 ng/mL (ref 11–307)

## 2022-03-29 LAB — FOLATE: Folate: 9.4 ng/mL (ref >5.9)

## 2022-03-29 NOTE — Assessment & Plan Note (Addendum)
Chronic anemia that has been worsening. This is anemia of chronic kidney disease/anemia of chronic disease. Repeat iron studies in March did not reveal iron deficiency, soluble transferrin receptor was normal. No hemolysis was found.  Serum protein electrophoresis is negative for a monoclonal spike. We therefore recommended that she increase the Retacrit to every 2 weeks. StoolHemoccults were negative.  Her hemoglobin has improved to 9.9.  Iron studies are pending from today.  As long as these are normal, we will continue Retacrit.  She reports occasional dizziness, but is not orthostatic today.  We will see her back in 4 weeks prior to her next Retacrit.

## 2022-03-29 NOTE — Telephone Encounter (Signed)
03/29/22 Next appt scheduled and confirmed with patient

## 2022-03-29 NOTE — Progress Notes (Signed)
[facility]  [address] [place],  [place]  [address] [phone]  Clinic Day:  03/29/2022  Referring physician: [name], MD  ASSESSMENT & PLAN:   Assessment & Plan: Anemia due to stage 3b chronic kidney disease ([place]) Chronic anemia that has been worsening.  This is anemia of chronic kidney disease/anemia of chronic disease.  Repeat iron studies in March did not reveal iron deficiency, soluble transferrin receptor was normal. No hemolysis was found.  Serum protein electrophoresis is negative for a monoclonal spike. We therefore recommended that Dana [name] increase the Retacrit to every 2 weeks. Stool Hemoccults were negative.  Her hemoglobin has improved to 9.9.  Iron studies are pending from today.  As long as these are normal, we will continue Retacrit.  Dana [name] reports occasional dizziness, but is not orthostatic today.  We will see her back in 4 weeks prior to her next Retacrit.  Leukocytosis Leukocytosis of uncertain etiology.  Dana [name] does not have signs of current infection and has not received corticosteroids.  This may be a result of the medication.  We will continue to monitor this.   The patient understands the plans discussed today and is in agreement with them.  Dana [name] knows to contact our office if Dana [name] develops concerns prior to her next appointment.   I provided 20 minutes of face-to-face time during this encounter and > 50% was spent counseling as documented under my assessment and plan.    Marvia Pickles, PA-C  [facility] AT [facility] [address] [place] [address] Dept: [phone] Dept Fax: [phone]   Orders Placed This Encounter  Procedures   CBC with Differential ([place] Only)    Standing Status:   Future    Number of Occurrences:   1    Standing Expiration Date:   03/30/2023   CMP ([place] only)    Standing Status:   Future    Number of Occurrences:   1     Standing Expiration Date:   03/30/2023   Ferritin    Standing Status:   Future    Number of Occurrences:   1    Standing Expiration Date:   03/30/2023   Iron and TIBC    Standing Status:   Future    Number of Occurrences:   1    Standing Expiration Date:   03/30/2023   Folate    Standing Status:   Future    Number of Occurrences:   1    Standing Expiration Date:   03/30/2023   Vitamin B12    Standing Status:   Future    Number of Occurrences:   1    Standing Expiration Date:   03/30/2023   CBC and differential    This external order was created through the Results Console.   CBC    This external order was created through the Results Console.   Basic metabolic panel    This external order was created through the Results Console.   Comprehensive metabolic panel    This external order was created through the Results Console.   Hepatic function panel    This external order was created through the Results Console.      CHIEF COMPLAINT:  CC: Anemia of chronic kidney disease  Current Treatment: Epoetin every 4 weeks  HISTORY OF PRESENT ILLNESS:  Dana [name] is a 61 y.o. female with a complicated medical history, including stage IV  chronic kidney disease.  Dana [name] has had chronic anemia since 2010.  Dana [name] had worsening anemia, so was given a dose of Retacrit in December.  Dana [name] then was then found to have a low iron saturation of 11% with a TIBC of 271 and ferritin 106 in February, and gave a history of allergic reaction to IV iron, so was referred here.  Dana [name] states Dana [name] broke out in severe hives after receiving IV iron in the past 1 to 2 years.  Dana [name] has had blood transfusion on several occasions, most recently in December 2022.  Dana [name] has a history of hypoxic brain injury in June 2022, so has difficulty with dates. In 2017, when Dana [name] reported rectal bleeding, as well as black tarry stools, EGD and colonoscopy were recommended.  EGD in September 2017 revealed a pyloric erosion.  We were unable to  access the colonoscopy report from September 2017.   Dana [name] was given IV Feraheme in February 2022 when admitted for acute on chronic diastolic heart failure.  Dana [name] reported severe generalized itching after receiving the first dose of Feraheme.  No hives were noted by the examining provider.  The 2nd dose of Feraheme was not given.  Dana [name] was transfused in February 2022, when her hemoglobin was 7.8 and again in March 2022 when her hemoglobin was 7.   Due to her family history of malignancy, Dana [name] underwent hereditary cancer genetic testing: which did not reveal any no pathogenic variants or variants of unknown significance in [facility] [facility].  Report date is November 02, 2021.   INTERVAL HISTORY:  Dana [name] is here today for repeat clinical assessment and has several complaints, including nausea and diarrhea for the past 2 days.  Dana [name] states Dana [name] has gastroparesis and uses Phenergan for nausea as well as lactulose for constipation.  Dana [name] reports fatigue. Dana [name] states Dana [name] has occasionally been dizzy.  Dana [name] denies fevers or chills. Dana [name] is generalized pain due to arthritis and has been referred to a rheumatologist.  Dana [name] also has a history of gout and her uric acid was elevated, so allopurinol was increased. Her appetite is decreased. Her weight has decreased 5 pounds over last month .  Bilateral screening mammogram in August did not reveal any evidence of malignancy.  Dana [name] denies recent steroid pills or injection.  Dana [name] is not taking oral iron.  Dana [name] states Dana [name] had a flu shot 10 days ago.  Dana [name] had a fairly recent urinary tract infection treated with antibiotics.  Advised her to see Dr. Tobie Poet if the symptoms persist.  REVIEW OF SYSTEMS:  Review of Systems  Constitutional:  Positive for appetite change, fever and unexpected weight change. Negative for chills and fatigue.  HENT:   Negative for lump/mass, mouth sores and sore throat.   Respiratory:  Negative for cough and shortness of breath.    Cardiovascular:  Negative for chest pain and leg swelling.  Gastrointestinal:  Positive for diarrhea and nausea. Negative for abdominal pain, constipation and vomiting.  Endocrine: Negative for hot flashes.  Genitourinary:  Negative for difficulty urinating, dysuria, frequency and hematuria.   Musculoskeletal:  Positive for gait problem (Uses a cane). Negative for arthralgias, back pain and myalgias.  Skin:  Negative for rash.  Neurological:  Positive for dizziness and gait problem (Uses a cane). Negative for headaches.  Hematological:  Negative for adenopathy. Does not bruise/bleed easily.  Psychiatric/Behavioral:  Negative for depression and sleep disturbance. The patient is not nervous/anxious.      VITALS:  Blood pressure (!) 147/67,  pulse 68, temperature 97.9 F (36.6 C), temperature source Oral, resp. rate 20, height 5' 4.8" (1.646 m), weight 228 lb 4.8 oz (103.6 kg), SpO2 98 %.  Blood pressure sitting is 153/70. Wt Readings from Last 3 Encounters:  03/29/22 228 lb 4.8 oz (103.6 kg)  03/15/22 233 lb (105.7 kg)  03/07/22 227 lb 12.8 oz (103.3 kg)    Body mass index is 38.23 kg/m.  Performance status (ECOG): 2 - Symptomatic, <50% confined to bed  PHYSICAL EXAM:  Physical Exam Vitals and nursing note reviewed.  Constitutional:      General: Dana [name] is not in acute distress.    Appearance: Normal appearance.  HENT:     Head: Normocephalic and atraumatic.     Mouth/Throat:     Mouth: Mucous membranes are moist.     Pharynx: Oropharynx is clear. No oropharyngeal exudate or posterior oropharyngeal erythema.  Eyes:     General: No scleral icterus.    Extraocular Movements: Extraocular movements intact.     Conjunctiva/sclera: Conjunctivae normal.     Pupils: Pupils are equal, round, and reactive to light.  Cardiovascular:     Rate and Rhythm: Normal rate and regular rhythm.     Heart sounds: Normal heart sounds. No murmur heard.    No friction rub. No gallop.  Pulmonary:      Effort: Pulmonary effort is normal.     Breath sounds: Normal breath sounds. No wheezing, rhonchi or rales.  Abdominal:     General: There is no distension.     Palpations: Abdomen is soft. There is no hepatomegaly, splenomegaly or mass.     Tenderness: There is no abdominal tenderness.  Musculoskeletal:        General: Normal range of motion.     Cervical back: Normal range of motion and neck supple. No tenderness.     Right lower leg: No edema.     Left lower leg: No edema.  Lymphadenopathy:     Cervical: No cervical adenopathy.     Upper Body:     Right upper body: No supraclavicular or axillary adenopathy.     Left upper body: No supraclavicular or axillary adenopathy.  Skin:    General: Skin is warm and dry.     Coloration: Skin is not jaundiced.     Findings: No rash.  Neurological:     Mental Status: Dana [name] is alert and oriented to person, place, and time.     Cranial Nerves: No cranial nerve deficit.  Psychiatric:        Mood and Affect: Mood normal.        Behavior: Behavior normal.        Thought Content: Thought content normal.     LABS:      Latest Ref Rng & Units 03/29/2022   12:00 AM 03/01/2022   12:00 AM 02/01/2022   12:00 AM  CBC  WBC  11.9  C    13.1     12.5      Hemoglobin 12.0 - 16.0 9.9  C    9.5     9.6      Hematocrit 36 - 46 31  C    30     30      Platelets 150 - 400 K/uL 278  C    255     250        C Corrected result   This result is from an external source.      Latest  Ref Rng & Units 03/29/2022   12:00 AM 03/01/2022   12:00 AM 02/02/2022    3:36 PM  CMP  Glucose 70 - 99 mg/dL   245   BUN 4 - 21 63  C    72     61   Creatinine 0.5 - 1.1 2.0  C    2.3     2.14   Sodium 137 - 147 138  C    138     138   Potassium 3.5 - 5.1 mEq/L 3.7  C    3.8     4.5   Chloride 99 - 108 103  C    103     103   CO2 13 - 22 13 - 22 27  C      '27     27     26   '$ Calcium 8.7 - 10.7 8.9  C    8.9     9.0   Alkaline Phos 25 - 125 154  C    126       AST 13 -  35 29  C    20       ALT 7 - 35 U/L 21  C    16         C Corrected result   This result is from an external source.   Multiple values from one day are sorted in reverse-chronological order     No results found for: "CEA1", "CEA" / No results found for: "CEA1", "CEA" No results found for: "PSA1" No results found for: "CAN199" No results found for: "CAN125"  Lab Results  Component Value Date   TOTALPROTELP 7.3 09/13/2021   ALBUMINELP 3.5 09/13/2021   A1GS 0.3 09/13/2021   A2GS 0.8 09/13/2021   BETS 1.0 09/13/2021   GAMS 1.8 09/13/2021   MSPIKE Not Observed 09/13/2021   Lab Results  Component Value Date   TIBC 263 03/29/2022   TIBC 333 12/07/2021   TIBC 328 09/13/2021   FERRITIN 141 03/29/2022   FERRITIN 34 12/07/2021   FERRITIN 64 09/13/2021   IRONPCTSAT 20 03/29/2022   IRONPCTSAT 12 12/07/2021   IRONPCTSAT 12 09/13/2021   Lab Results  Component Value Date   LDH 176 09/13/2021    STUDIES:  MM 3D SCREEN BREAST BILATERAL  Result Date: 03/16/2022 CLINICAL DATA:  Screening. EXAM: DIGITAL SCREENING BILATERAL MAMMOGRAM WITH TOMOSYNTHESIS AND CAD TECHNIQUE: Bilateral screening digital craniocaudal and mediolateral oblique mammograms were obtained. Bilateral screening digital breast tomosynthesis was performed. The images were evaluated with computer-aided detection. COMPARISON:  Previous exam(s). ACR Breast Density Category b: There are scattered areas of fibroglandular density. FINDINGS: There are no findings suspicious for malignancy. IMPRESSION: No mammographic evidence of malignancy. A result letter of this screening mammogram will be mailed directly to the patient. RECOMMENDATION: Screening mammogram in one year. (Code:SM-B-01Y) BI-RADS CATEGORY  1: Negative. Electronically Signed   By: Dorise Bullion III M.D.   On: 03/16/2022 08:47      HISTORY:   Past Medical History:  Diagnosis Date   Acquired absence of left great toe ([place])    Acquired absence of other left toe(s)  ([place])    ACS (acute coronary syndrome) ([place])    Anemia    Anoxic brain injury ([place])    Anxiety    CAD (coronary artery disease)    DES mLAD 04/07/15; DES dRCA & DES pPDA 08/30/16; DES mCX & PTCA OM1 09/29/20   Chronic diastolic (  congestive) heart failure (HCC)    Chronic pain    Chronic systolic (congestive) heart failure (HCC)    CKD (chronic kidney disease)    Contrast dye induced nephropathy, possible 09/03/2020   COPD (chronic obstructive pulmonary disease) (HCC)    Diabetes (HCC)    Diastolic CHF ([place])    Dyspnea    Family history of breast cancer 10/23/2021   Gastro-esophageal reflux disease without esophagitis    Hyperlipidemia    Hypertension    Hypertensive heart disease with heart failure (HCC)    Hypoxemia    Idiopathic gout, multiple sites    Leukocytosis 03/29/2022   Localized edema    Low back pain    Mitral regurgitation    Mitral regurgitation    Mixed hyperlipidemia    Mixed incontinence    Morbid (severe) obesity due to excess calories (HCC)    Neuromuscular disorder (HCC)    Neuropathy    Non-ST elevation (NSTEMI) myocardial infarction ([place])    08/29/20, 09/29/20   OSA (obstructive sleep apnea) 09/03/2020   Other specified hypothyroidism    PAF (paroxysmal atrial fibrillation) ([place])    s/p pulmonary vein isolation by cryoablation 10/04/15 Dr. Minna Merritts in [facility]   PEA (Pulseless electrical activity) [facility])    ~ 01/13/21 at [facility] during admission DKA, volume overload, possible CAP, hypoxia s/p ACLS with ROSC ~ 10-15   Pneumonia    Primary insomnia    Rheumatoid arthritis ([place])    Stroke ([place])    Thyroid disease    Type 2 diabetes mellitus with diabetic nephropathy ([place])    Vitamin D deficiency     Past Surgical History:  Procedure Laterality Date   ABDOMINAL HYSTERECTOMY     Partial- Still has both ovaries   CARDIOVASCULAR STRESS TEST  08/13/2014   Nuclear; Normal   CARPAL TUNNEL RELEASE     CATARACT EXTRACTION, BILATERAL     CESAREAN  SECTION     CHOLECYSTECTOMY     CORONARY ANGIOPLASTY WITH STENT PLACEMENT     3 blockages/ 1 stent   CORONARY STENT INTERVENTION N/A 09/29/2020   Procedure: CORONARY STENT INTERVENTION;  Surgeon: Sherren Mocha, MD;  Location: [facility];  Service: Cardiovascular;  Laterality: N/A;  CFX   EYE SURGERY     LEFT HEART CATH AND CORONARY ANGIOGRAPHY N/A 09/29/2020   Procedure: LEFT HEART CATH AND CORONARY ANGIOGRAPHY;  Surgeon: Sherren Mocha, MD;  Location: [facility];  Service: Cardiovascular;  Laterality: N/A;   MITRAL VALVE REPAIR N/A 05/18/2021   Procedure: MITRAL VALVE REPAIR;  Surgeon: Early Osmond, MD;  Location: [facility];  Service: Cardiovascular;  Laterality: N/A;   RIGHT HEART CATH N/A 04/27/2021   Procedure: RIGHT HEART CATH;  Surgeon: Jolaine Artist, MD;  Location: [facility];  Service: Cardiovascular;  Laterality: N/A;   RIGHT/LEFT HEART CATH AND CORONARY ANGIOGRAPHY N/A 01/07/2017   Procedure: Right/Left Heart Cath and Coronary Angiography;  Surgeon: Larey Dresser, MD;  Location: [facility];  Service: Cardiovascular;  Laterality: N/A;   ROTATOR CUFF REPAIR     TEE WITHOUT CARDIOVERSION N/A 04/28/2021   Procedure: TRANSESOPHAGEAL ECHOCARDIOGRAM (TEE);  Surgeon: Jolaine Artist, MD;  [facility]: [facility] ENDOSCOPY;  Service: Cardiovascular;  Laterality: N/A;   TEE WITHOUT CARDIOVERSION N/A 05/18/2021   Procedure: TRANSESOPHAGEAL ECHOCARDIOGRAM (TEE);  Surgeon: Early Osmond, MD;  Location: [facility];  Service: Cardiovascular;  Laterality: N/A;   TOE AMPUTATION Left  US ECHOCARDIOGRAPHY  05/2017   Normal    Family History  Problem Relation Age of Onset   Breast cancer Mother 40       contralateral breast ca dx 47   Coronary artery disease Mother    Hypertension Mother    Hyperlipidemia Mother    Diabetes type II Mother    Lung cancer Mother 75   Diabetes Father    Hypertension Father    Mental illness Sister     Bipolar disorder Other    Depression Other    Schizophrenia Other    Cancer Other        MGF's sisters x2; unknown type    Social History:  reports that Dana [name] quit smoking about 39 years ago. Her smoking use included cigarettes. Dana [name] started smoking about 40 years ago. Dana [name] has never used smokeless tobacco. Dana [name] reports that Dana [name] does not drink alcohol and does not use drugs.The patient is alone today.  Allergies:  Allergies  Allergen Reactions   Naproxen Hives and Rash   Celecoxib Other (See Comments)    Stomach pain   Aspirin Other (See Comments)    CAN NOT TAKE DUE TO ULCERS (documented previously) but takes every day currently (as of 09/29/20) without reported issues Can take coated aspirin   Glucosamine Hives, Swelling and Other (See Comments)    Angioedema    Iron     IV iron transfusion - hives - Feb 2022 hospitalization   Metoclopramide Other (See Comments)    Facial twitching and stuttering   Metolazone Other (See Comments)    Acute renal failure   Other Hives    Strawberry allergy   Shellfish-Derived Products Hives, Swelling and Other (See Comments)    Angioedema    Statins Other (See Comments)    Muscle pain   Strawberry (Diagnostic)    Strawberry Extract    Strawberry Flavor    Strawberry Leaves Extract    Tramadol Nausea And Vomiting         Current Medications: Current Outpatient Medications  Medication Sig Dispense Refill   allopurinol (ZYLOPRIM) 300 MG tablet Take 1 tablet (300 mg total) by mouth daily. 90 tablet 1   amoxicillin (AMOXIL) 500 MG capsule TAKE FOUR CAPSULES BY MOUTH ONE HOUR BEFORE DENTAL PROCEDURES AND CLEANINGS (Patient not taking: Reported on 03/29/2022)     busPIRone (BUSPAR) 5 MG tablet TAKE ONE TABLET BY MOUTH THREE TIMES DAILY 270 tablet 1   carvedilol (COREG) 12.5 MG tablet TAKE ONE TABLET BY MOUTH TWICE DAILY 180 tablet 3   clopidogrel (PLAVIX) 75 MG tablet Take 1 tablet by mouth daily.     Continuous Blood Gluc Sensor (FREESTYLE LIBRE  2 SENSOR) MISC USE TO check blood glucose AS DIRECTED AND CHANGE sensor every 14 DAYS     Cyanocobalamin 3000 MCG CAPS Take 2 capsules by mouth daily. Patient purchased 3000 mcg gummies and taking 2 gummies daily     EPINEPHrine 0.3 mg/0.3 mL IJ SOAJ injection Use as directed for life-threatening allergic reaction. 4 Device 3   ergocalciferol (VITAMIN D2) 1.25 MG (50000 UT) capsule Take by mouth once a week.     Evolocumab (REPATHA SURECLICK) 585 MG/ML SOAJ Inject 1 Dose into the skin every 14 (fourteen) days. 6 mL 1   ezetimibe (ZETIA) 10 MG tablet Take 1 tablet (10 mg total) by mouth daily. 90 tablet 3   hydrALAZINE (APRESOLINE) 25 MG tablet Take 1 tablet (25 mg total) by mouth 3 (three) times daily. 90 tablet  6   HYDROcodone-acetaminophen (NORCO) 7.5-325 MG tablet TAKE ONE TABLET BY MOUTH three times daily AS NEEDED FOR moderate pain 90 tablet 0   insulin aspart (NOVOLOG FLEXPEN) 100 UNIT/ML FlexPen 2-10 U before meals based on SSI. 15 mL 3   Insulin Pen Needle (BD PEN NEEDLE NANO 2ND GEN) 32G X 4 MM MISC Inject 1 each into the skin in the morning, at noon, in the evening, and at bedtime. 600 each 3   isosorbide mononitrate (IMDUR) 30 MG 24 hr tablet Take 1 tablet by mouth daily.     lactulose (CHRONULAC) 10 GM/15ML solution Take 15 mLs (10 g total) by mouth daily as needed for mild constipation. 236 mL 1   latanoprost (XALATAN) 0.005 % ophthalmic solution Place 1 drop into both eyes at bedtime.     LEVEMIR FLEXPEN 100 UNIT/ML FlexPen inejct 50 units into THE SKIN daily 15 mL 1   LORazepam (ATIVAN) 0.5 MG tablet TAKE ONE TABLET BY MOUTH ONCE DAILY AS NEEDED FOR ANXIETY 30 tablet 2   magnesium oxide (MAG-OX) 400 MG tablet Take 2 tablets (800 mg total) by mouth daily. Take 2 (400 mg) daily=800 mg 60 tablet 1   nitroGLYCERIN (NITROSTAT) 0.4 MG SL tablet Place 1 tablet (0.4 mg total) under the tongue every 5 (five) minutes as needed for chest pain. 25 tablet 3   Omega-3 Fatty Acids (FISH OIL PO) Take  1 capsule by mouth daily.     OXYGEN Inhale 2 L into the lungs at bedtime.     pantoprazole (PROTONIX) 40 MG tablet Take 1 tablet (40 mg total) by mouth daily. 90 tablet 1   Potassium Chloride ER 20 MEQ TBCR Take 1 tablet by mouth daily.     Probiotic CAPS Take 1 capsule by mouth daily.     promethazine (PHENERGAN) 25 MG tablet Take 1 tablet (25 mg total) by mouth as needed. 30 tablet 3   torsemide (DEMADEX) 20 MG tablet Take 4 tablets (80 mg total) by mouth daily. 130 tablet 5   No current facility-administered medications for this visit.

## 2022-03-29 NOTE — Assessment & Plan Note (Signed)
Leukocytosis of uncertain etiology.  She does not have signs of current infection and has not received corticosteroids.  This may be a result of the medication.  We will continue to monitor this.

## 2022-03-30 ENCOUNTER — Inpatient Hospital Stay: Payer: HMO

## 2022-03-30 VITALS — BP 146/56 | HR 70 | Temp 97.6°F | Resp 18 | Ht 64.8 in | Wt 232.2 lb

## 2022-03-30 DIAGNOSIS — D631 Anemia in chronic kidney disease: Secondary | ICD-10-CM

## 2022-03-30 DIAGNOSIS — N184 Chronic kidney disease, stage 4 (severe): Secondary | ICD-10-CM | POA: Diagnosis not present

## 2022-03-30 MED ORDER — EPOETIN ALFA-EPBX 20000 UNIT/ML IJ SOLN
20000.0000 [IU] | Freq: Once | INTRAMUSCULAR | Status: AC
  Administered 2022-03-30: 20000 [IU] via SUBCUTANEOUS
  Filled 2022-03-30: qty 1

## 2022-03-30 NOTE — Patient Instructions (Signed)

## 2022-04-03 DIAGNOSIS — F411 Generalized anxiety disorder: Secondary | ICD-10-CM | POA: Diagnosis not present

## 2022-04-09 ENCOUNTER — Other Ambulatory Visit (HOSPITAL_COMMUNITY): Payer: Self-pay | Admitting: Family Medicine

## 2022-04-10 ENCOUNTER — Other Ambulatory Visit: Payer: Self-pay

## 2022-04-10 ENCOUNTER — Telehealth: Payer: Self-pay

## 2022-04-10 DIAGNOSIS — F411 Generalized anxiety disorder: Secondary | ICD-10-CM

## 2022-04-10 MED ORDER — LEVEMIR FLEXPEN 100 UNIT/ML ~~LOC~~ SOPN: PEN_INJECTOR | SUBCUTANEOUS | 1 refills | Status: DC

## 2022-04-10 MED ORDER — NOVOLOG FLEXPEN 100 UNIT/ML ~~LOC~~ SOPN: PEN_INJECTOR | SUBCUTANEOUS | 3 refills | Status: DC

## 2022-04-10 MED ORDER — BUSPIRONE HCL 5 MG PO TABS: 5.0000 mg | ORAL_TABLET | Freq: Three times a day (TID) | ORAL | 1 refills | Status: DC

## 2022-04-10 NOTE — Telephone Encounter (Signed)
Compliant on meds 

## 2022-04-10 NOTE — Progress Notes (Signed)
Chronic Care Management Pharmacy Assistant   Name: Dana [name]  MRN: [id number] DOB: [birth year]   Reason for Encounter: Medication Coordination for [facility]    Recent office visits:  None  Recent consult visits:  03/29/22 (Hematology and Oncology) Rosanne Sack PA-C. Seen for Anemia due to CKD. No med changes.  Hospital visits:  None  Medications: Outpatient Encounter Medications as of 04/10/2022  Medication Sig   allopurinol (ZYLOPRIM) 300 MG tablet Take 1 tablet (300 mg total) by mouth daily.   amoxicillin (AMOXIL) 500 MG capsule TAKE FOUR CAPSULES BY MOUTH ONE HOUR BEFORE DENTAL PROCEDURES AND CLEANINGS (Patient not taking: Reported on 03/29/2022)   busPIRone (BUSPAR) 5 MG tablet TAKE ONE TABLET BY MOUTH THREE TIMES DAILY   carvedilol (COREG) 12.5 MG tablet TAKE ONE TABLET BY MOUTH TWICE DAILY   clopidogrel (PLAVIX) 75 MG tablet Take 1 tablet by mouth daily.   Continuous Blood Gluc Sensor (FREESTYLE LIBRE 2 SENSOR) MISC USE TO check blood glucose AS DIRECTED AND CHANGE sensor every 14 DAYS   Cyanocobalamin 3000 MCG CAPS Take 2 capsules by mouth daily. Patient purchased 3000 mcg gummies and taking 2 gummies daily   EPINEPHrine 0.3 mg/0.3 mL IJ SOAJ injection Use as directed for life-threatening allergic reaction.   Evolocumab (REPATHA SURECLICK) 347 MG/ML SOAJ Inject 1 Dose into the skin every 14 (fourteen) days.   ezetimibe (ZETIA) 10 MG tablet Take 1 tablet (10 mg total) by mouth daily.   hydrALAZINE (APRESOLINE) 25 MG tablet Take 1 tablet (25 mg total) by mouth 3 (three) times daily.   HYDROcodone-acetaminophen (NORCO) 7.5-325 MG tablet TAKE ONE TABLET BY MOUTH three times daily AS NEEDED FOR moderate pain   insulin aspart (NOVOLOG FLEXPEN) 100 UNIT/ML FlexPen 2-10 U before meals based on SSI.   Insulin Pen Needle (BD PEN NEEDLE NANO 2ND GEN) 32G X 4 MM MISC Inject 1 each into the skin in the morning, at noon, in the evening, and at bedtime.   isosorbide mononitrate (IMDUR)  30 MG 24 hr tablet Take 1 tablet by mouth daily.   lactulose (CHRONULAC) 10 GM/15ML solution Take 15 mLs (10 g total) by mouth daily as needed for mild constipation.   latanoprost (XALATAN) 0.005 % ophthalmic solution Place 1 drop into both eyes at bedtime.   LEVEMIR FLEXPEN 100 UNIT/ML FlexPen inejct 50 units into THE SKIN daily   LORazepam (ATIVAN) 0.5 MG tablet TAKE ONE TABLET BY MOUTH ONCE DAILY AS NEEDED FOR ANXIETY   magnesium oxide (MAG-OX) 400 MG tablet Take 2 tablets (800 mg total) by mouth daily. Take 2 (400 mg) daily=800 mg   nitroGLYCERIN (NITROSTAT) 0.4 MG SL tablet Place 1 tablet (0.4 mg total) under the tongue every 5 (five) minutes as needed for chest pain.   Omega-3 Fatty Acids (FISH OIL PO) Take 1 capsule by mouth daily.   OXYGEN Inhale 2 L into the lungs at bedtime.   pantoprazole (PROTONIX) 40 MG tablet Take 1 tablet (40 mg total) by mouth daily.   Potassium Chloride ER 20 MEQ TBCR Take 1 tablet by mouth daily.   Probiotic CAPS Take 1 capsule by mouth daily.   promethazine (PHENERGAN) 25 MG tablet Take 1 tablet (25 mg total) by mouth as needed.   torsemide (DEMADEX) 20 MG tablet TAKE 4 TABLETS BY MOUTH ONCE DAILY   No facility-administered encounter medications on file as of 04/10/2022.    Reviewed chart for medication changes ahead of medication coordination call.  No OVs, or hospital visits  since last care coordination call/Pharmacist visit.  No medication changes indicated OR if recent visit, treatment plan here.  BP Readings from Last 3 Encounters:  03/30/22 (!) 146/56  03/29/22 (!) 147/67  03/15/22 (!) 126/56    Lab Results  Component Value Date   HGBA1C 8.2 (H) 01/19/2022     Patient obtains medications through Vials  90 Days   Last adherence delivery included:  Allopurinol '100mg'$  1 tab daily Novolog Flexpen Inject 2-10 units before meals Buspirone '5mg'$ - 1 tab three times daily Carvedilol 12.'5mg'$ - 1 tab twice daily  Clopidogrel '75mg'$ - 1 tab at  bedtime Ezetimibe '10mg'$ - 1 tab once daily Hydralazine '25mg'$ - 1 tab three times daily  Isosorbide Mono '30mg'$ - 1 tab once daily  Pantoprazole '40mg'$ - 1 tab once daily  Potassium Chloride Er 5mq- 1 tab once daily  Torsemide '20mg'$ - 4 tabs once daily  Freestyle Libre 2 Sensors- Change Sensor every 14 days. Repatha '140mg'$ - Inject '140mg'$  every 14 days Levemir Flexpen Inj 100u-inject 50 units daily              Patient declined (meds) last month  patient just got promethazine filled, hydrocodone and lorazepam not due yet for delivery. Aspirin '81mg'$  D/C by provider  BD Pen needles- Pt has plenty, does not need  Patient is due for next adherence delivery on: 04/20/22. Called patient and reviewed medications and coordinated delivery.  This delivery to include: Allopurinol '300mg'$  1 tab daily Novolog Flexpen Inject 2-10 units before meals Buspirone '5mg'$ - 1 tab three times daily Carvedilol 12.'5mg'$ - 1 tab twice daily  Clopidogrel '75mg'$ - 1 tab at bedtime Ezetimibe '10mg'$ - 1 tab once daily Hydralazine '25mg'$ - 1 tab three times daily  Isosorbide Mono '30mg'$ - 1 tab once daily  Pantoprazole '40mg'$ - 1 tab once daily  Potassium Chloride Er 261m- 1 tab once daily  Torsemide '20mg'$ - 4 tabs once daily  Freestyle Libre 2 Sensors- Change Sensor every 14 days.  Repatha '140mg'$ - Inject '140mg'$  every 14 days Levemir Flexpen Inj 100u-inject 50 units daily   Promethazine '25mg'$ -1 tab daily as needed   Patient declined the following medications BD Pen needles- Pt has plenty, does not need Lactulose 10gm- Does not need  Lorazepam 0.'5mg'$ -filled on 03/30/22 30ds. Will fill when due  Hydrocodone-APAP 7.5-'325mg'$ -filled on 03/30/22 30ds.Will fill when due   Patient needs refills -Request Sent  Novolog Flexpen  Buspirone '5mg'$  Levemir Flexpen Inj 100u  Confirmed delivery date of 04/20/22, advised patient that pharmacy will contact them the morning of delivery.   DaElray McgregorCMFairleaharmacist Assistant  [phone]

## 2022-04-12 ENCOUNTER — Ambulatory Visit: Payer: HMO

## 2022-04-12 ENCOUNTER — Other Ambulatory Visit: Payer: Self-pay | Admitting: Pharmacist

## 2022-04-13 ENCOUNTER — Ambulatory Visit: Payer: HMO

## 2022-04-16 ENCOUNTER — Inpatient Hospital Stay: Payer: HMO | Attending: Hematology and Oncology

## 2022-04-16 VITALS — BP 143/60 | HR 68 | Temp 98.1°F | Resp 18 | Ht 64.8 in | Wt 230.0 lb

## 2022-04-16 DIAGNOSIS — N184 Chronic kidney disease, stage 4 (severe): Secondary | ICD-10-CM | POA: Insufficient documentation

## 2022-04-16 DIAGNOSIS — D631 Anemia in chronic kidney disease: Secondary | ICD-10-CM | POA: Insufficient documentation

## 2022-04-16 MED ORDER — EPOETIN ALFA-EPBX 20000 UNIT/ML IJ SOLN
20000.0000 [IU] | Freq: Once | INTRAMUSCULAR | Status: AC
  Administered 2022-04-16: 20000 [IU] via SUBCUTANEOUS
  Filled 2022-04-16: qty 1

## 2022-04-16 NOTE — Patient Instructions (Signed)

## 2022-04-25 ENCOUNTER — Encounter: Payer: Self-pay | Admitting: Family Medicine

## 2022-04-25 ENCOUNTER — Ambulatory Visit (INDEPENDENT_AMBULATORY_CARE_PROVIDER_SITE_OTHER): Payer: HMO | Admitting: Family Medicine

## 2022-04-25 ENCOUNTER — Other Ambulatory Visit: Payer: Self-pay | Admitting: Hematology and Oncology

## 2022-04-25 VITALS — BP 130/62 | HR 79 | Temp 97.5°F | Ht 64.0 in | Wt 234.0 lb

## 2022-04-25 DIAGNOSIS — G4733 Obstructive sleep apnea (adult) (pediatric): Secondary | ICD-10-CM

## 2022-04-25 DIAGNOSIS — I25118 Atherosclerotic heart disease of native coronary artery with other forms of angina pectoris: Secondary | ICD-10-CM

## 2022-04-25 DIAGNOSIS — K219 Gastro-esophageal reflux disease without esophagitis: Secondary | ICD-10-CM

## 2022-04-25 DIAGNOSIS — D631 Anemia in chronic kidney disease: Secondary | ICD-10-CM

## 2022-04-25 DIAGNOSIS — E559 Vitamin D deficiency, unspecified: Secondary | ICD-10-CM | POA: Diagnosis not present

## 2022-04-25 DIAGNOSIS — J9611 Chronic respiratory failure with hypoxia: Secondary | ICD-10-CM | POA: Diagnosis not present

## 2022-04-25 DIAGNOSIS — E1142 Type 2 diabetes mellitus with diabetic polyneuropathy: Secondary | ICD-10-CM

## 2022-04-25 DIAGNOSIS — E782 Mixed hyperlipidemia: Secondary | ICD-10-CM

## 2022-04-25 DIAGNOSIS — I130 Hypertensive heart and chronic kidney disease with heart failure and stage 1 through stage 4 chronic kidney disease, or unspecified chronic kidney disease: Secondary | ICD-10-CM | POA: Diagnosis not present

## 2022-04-25 DIAGNOSIS — G720 Drug-induced myopathy: Secondary | ICD-10-CM

## 2022-04-25 NOTE — Progress Notes (Signed)
[facility]  [address] [place],  [place]  [address] [phone]  Clinic Day:  04/26/2022  Referring physician: [name], MD  ASSESSMENT & PLAN:   Assessment & Plan: Leukocytosis Leukocytosis of uncertain etiology.  She does not have signs of current infection and has not received corticosteroids.  This may be a result of other medication.  The leukocytosis is improving.  We will continue to monitor this.  Anemia due to stage 3b chronic kidney disease (HCC) Chronic anemia that has been worsening.  This is anemia of chronic kidney disease/anemia of chronic disease, for which she has been on epoetin since March.  She did not have a robust response, so we recommended epoetin 20,000 units every 2 weeks.  Repeat repeat iron studies in September did not reveal evidence of iron deficiency, so we continued epoetin every 2 weeks.   She has had improvement in her hemoglobin at this time.  We will continue epoetin every 2 weeks.  She will have a CBC monthly.  I will plan to see her back in 3 months with a CBC and comprehensive metabolic panel for repeat clinical assessment   The patient understands the plans discussed today and is in agreement with them.  She knows to contact our office if she develops concerns prior to her next appointment.     Dana Pickles, PA-C  [facility] [address] [place] [address] Dept: [phone] Dept Fax: [phone]   Orders Placed This Encounter  Procedures   CBC and differential    This external order was created through the Results Console.   CBC    This external order was created through the Results Console.   CMP ([place] only)    Standing Status:   Future    Standing Expiration Date:   04/27/2023      CHIEF COMPLAINT:  CC: Anemia due to chronic kidney disease  Current Treatment:  Epoetin every 2 weeks  HISTORY OF PRESENT  ILLNESS:  Dana [name] is a 61 y.o. female with a complicated medical history, including stage IV chronic kidney disease.  She has had chronic anemia since 2010.  She had worsening anemia, so was given a dose of Retacrit in December.  She then was then found to have a low iron saturation of 11% with a TIBC of 271 and ferritin 106 in February, and gave a history of allergic reaction to IV iron, so was referred here.  She states she broke out in severe hives after receiving IV iron in the past 1 to 2 years.  She has had blood transfusion on several occasions, most recently in December 2022.  She has a history of hypoxic brain injury in June 2022, so has difficulty with dates. In 2017, when she reported rectal bleeding, as well as black tarry stools, EGD and colonoscopy were recommended.  EGD in September 2017 revealed a pyloric erosion.  We were unable to access the colonoscopy report from September 2017.    She was given IV Feraheme in February 2022 when admitted for acute on chronic diastolic heart failure.  She reported severe generalized itching after receiving the first dose of Feraheme.  No hives were noted by the examining provider.  The 2nd dose of Feraheme was not given.  She was transfused in February 2022, when her hemoglobin was 7.8 and again in March 2022 when her hemoglobin was 7.   Due  to her family history of malignancy, she underwent hereditary cancer genetic testing: which did not reveal any no pathogenic variants or variants of unknown significance in [employer] [facility] Panel.  Report date is November 02, 2021.   INTERVAL HISTORY:  Dana [name] is here today for repeat clinical assessment.  She denies progressive fatigue concerning for worsening anemia.  She denies any overt form of blood loss. She denies fevers or chills. She denies pain. Her appetite is good. Her weight has increased 4 pounds over last 4 weeks .  REVIEW OF SYSTEMS:  Review of Systems  Constitutional:  Positive  for fatigue. Negative for appetite change, chills, fever and unexpected weight change.  HENT:   Negative for lump/mass, mouth sores and sore throat.   Respiratory:  Negative for cough and shortness of breath.   Cardiovascular:  Positive for leg swelling. Negative for chest pain.  Gastrointestinal:  Negative for abdominal pain, constipation, diarrhea, nausea and vomiting.  Endocrine: Negative for hot flashes.  Genitourinary:  Negative for difficulty urinating, dysuria, frequency and hematuria.   Musculoskeletal:  Negative for arthralgias, back pain and myalgias.  Skin:  Negative for rash.  Neurological:  Negative for dizziness and headaches.  Hematological:  Negative for adenopathy. Does not bruise/bleed easily.  Psychiatric/Behavioral:  Negative for depression and sleep disturbance. The patient is not nervous/anxious.      VITALS:  Blood pressure (!) 172/74, pulse 73, temperature 97.6 F (36.4 C), temperature source Oral, resp. rate 20, height '5\' 4"'$  (1.626 m), weight 238 lb 6.4 oz (108.1 kg), SpO2 96 %.  Wt Readings from Last 3 Encounters:  04/26/22 238 lb 6.4 oz (108.1 kg)  04/25/22 234 lb (106.1 kg)  04/16/22 230 lb (104.3 kg)    Body mass index is 40.92 kg/m.  Performance status (ECOG): 1 - Symptomatic but completely ambulatory  PHYSICAL EXAM:  Physical Exam Vitals and nursing note reviewed.  Constitutional:      General: She is not in acute distress.    Appearance: Normal appearance.  HENT:     Head: Normocephalic and atraumatic.     Mouth/Throat:     Mouth: Mucous membranes are moist.     Pharynx: Oropharynx is clear. No oropharyngeal exudate or posterior oropharyngeal erythema.  Eyes:     General: No scleral icterus.    Extraocular Movements: Extraocular movements intact.     Conjunctiva/sclera: Conjunctivae normal.     Pupils: Pupils are equal, round, and reactive to light.  Cardiovascular:     Rate and Rhythm: Normal rate and regular rhythm.     Heart sounds:  Normal heart sounds. No murmur heard.    No friction rub. No gallop.  Pulmonary:     Effort: Pulmonary effort is normal.     Breath sounds: Normal breath sounds. No wheezing, rhonchi or rales.  Abdominal:     General: There is no distension.     Palpations: Abdomen is soft. There is no hepatomegaly, splenomegaly or mass.     Tenderness: There is no abdominal tenderness.  Musculoskeletal:        General: Normal range of motion.     Cervical back: Normal range of motion and neck supple. No tenderness.     Right lower leg: Edema (1+) present.     Left lower leg: Edema (1+) present.  Lymphadenopathy:     Cervical: No cervical adenopathy.     Upper Body:     Right upper body: No supraclavicular or axillary adenopathy.     Left  upper body: No supraclavicular or axillary adenopathy.     Lower Body: No right inguinal adenopathy. No left inguinal adenopathy.  Skin:    General: Skin is warm and dry.     Coloration: Skin is not jaundiced.     Findings: No rash.  Neurological:     Mental Status: She is alert and oriented to person, place, and time.     Cranial Nerves: No cranial nerve deficit.  Psychiatric:        Mood and Affect: Mood normal.        Behavior: Behavior normal.        Thought Content: Thought content normal.     LABS:      Latest Ref Rng & Units 04/26/2022   12:00 AM 04/25/2022    8:13 AM 03/29/2022   12:00 AM  CBC  WBC  11.4     13.7  11.9  C     Hemoglobin 12.0 - 16.0 10.3     10.4  9.9  C     Hematocrit 36 - 46 32     32.5  31  C     Platelets 150 - 400 K/uL 257     271  278  C       C Corrected result   This result is from an external source.      Latest Ref Rng & Units 04/25/2022    8:13 AM 03/29/2022   12:00 AM 03/01/2022   12:00 AM  CMP  Glucose 70 - 99 mg/dL 130     BUN 6 - 24 mg/dL 58  63  C    72      Creatinine 0.57 - 1.00 mg/dL 2.16  2.0  C    2.3      Sodium 134 - 144 mmol/L 141  138  C    138      Potassium 3.5 - 5.2 mmol/L 3.9  3.7  C    3.8       Chloride 96 - 106 mmol/L 99  103  C    103      CO2 20 - 29 mmol/L 23  27  C      27     27      Calcium 8.7 - 10.2 mg/dL 9.3  8.9  C    8.9      Total Protein 6.0 - 8.5 g/dL 7.5     Total Bilirubin 0.0 - 1.2 mg/dL 0.4     Alkaline Phos 44 - 121 IU/L 177  154  C    126      AST 0 - 40 IU/L 16  29  C    20      ALT 0 - 32 IU/L 16  21  C    16        C Corrected result   This result is from an external source.   Multiple values from one day are sorted in reverse-chronological order     No results found for: "CEA1", "CEA" / No results found for: "CEA1", "CEA" No results found for: "PSA1" No results found for: "YTK160" No results found for: "CAN125"  Lab Results  Component Value Date   TOTALPROTELP 7.3 09/13/2021   ALBUMINELP 3.5 09/13/2021   A1GS 0.3 09/13/2021   A2GS 0.8 09/13/2021   BETS 1.0 09/13/2021   GAMS 1.8 09/13/2021   MSPIKE Not Observed 09/13/2021   Lab Results  Component Value Date  TIBC 263 03/29/2022   TIBC 333 12/07/2021   TIBC 328 09/13/2021   FERRITIN 141 03/29/2022   FERRITIN 34 12/07/2021   FERRITIN 64 09/13/2021   IRONPCTSAT 20 03/29/2022   IRONPCTSAT 12 12/07/2021   IRONPCTSAT 12 09/13/2021   Lab Results  Component Value Date   LDH 176 09/13/2021    STUDIES:  No results found.    HISTORY:   Past Medical History:  Diagnosis Date   Acquired absence of left great toe ([place])    Acquired absence of other left toe(s) ([place])    ACS (acute coronary syndrome) ([place])    Anemia    Anoxic brain injury ([place])    Anxiety    CAD (coronary artery disease)    DES mLAD 04/07/15; DES dRCA & DES pPDA 08/30/16; DES mCX & PTCA OM1 09/29/20   Chronic diastolic (congestive) heart failure (HCC)    Chronic pain    Chronic systolic (congestive) heart failure (HCC)    CKD (chronic kidney disease)    Contrast dye induced nephropathy, possible 09/03/2020   COPD (chronic obstructive pulmonary disease) (HCC)    Diabetes (HCC)    Diastolic CHF ([place])    Dyspnea     Family history of breast cancer 10/23/2021   Gastro-esophageal reflux disease without esophagitis    Hyperlipidemia    Hypertension    Hypertensive heart disease with heart failure (HCC)    Hypoxemia    Idiopathic gout, multiple sites    Leukocytosis 03/29/2022   Localized edema    Low back pain    Mitral regurgitation    Mitral regurgitation    Mixed hyperlipidemia    Mixed incontinence    Morbid (severe) obesity due to excess calories (HCC)    Neuromuscular disorder (HCC)    Neuropathy    Non-ST elevation (NSTEMI) myocardial infarction ([place])    08/29/20, 09/29/20   OSA (obstructive sleep apnea) 09/03/2020   Other specified hypothyroidism    PAF (paroxysmal atrial fibrillation) (HCC)    s/p pulmonary vein isolation by cryoablation 10/04/15 Dr. Minna Merritts in [facility]   PEA (Pulseless electrical activity) [facility])    ~ 01/13/21 at [facility] during admission DKA, volume overload, possible CAP, hypoxia s/p ACLS with ROSC ~ 10-15   Pneumonia    Primary insomnia    Rheumatoid arthritis ([place])    Stroke ([place])    Thyroid disease    Type 2 diabetes mellitus with diabetic nephropathy ([place])    Vitamin D deficiency     Past Surgical History:  Procedure Laterality Date   ABDOMINAL HYSTERECTOMY     Partial- Still has both ovaries   CARDIOVASCULAR STRESS TEST  08/13/2014   Nuclear; Normal   CARPAL TUNNEL RELEASE     CATARACT EXTRACTION, BILATERAL     CESAREAN SECTION     CHOLECYSTECTOMY     CORONARY ANGIOPLASTY WITH STENT PLACEMENT     3 blockages/ 1 stent   CORONARY STENT INTERVENTION N/A 09/29/2020   Procedure: CORONARY STENT INTERVENTION;  Surgeon: Sherren Mocha, MD;  Location: [facility];  Service: Cardiovascular;  Laterality: N/A;  CFX   EYE SURGERY     LEFT HEART CATH AND CORONARY ANGIOGRAPHY N/A 09/29/2020   Procedure: LEFT HEART CATH AND CORONARY ANGIOGRAPHY;  Surgeon: Sherren Mocha, MD;  Location: [place] [facility];  Service: Cardiovascular;  Laterality: N/A;    MITRAL VALVE REPAIR N/A 05/18/2021   Procedure: MITRAL VALVE REPAIR;  Surgeon: Early Osmond, MD;  Location: [facility];  Service: Cardiovascular;  Laterality: N/A;   RIGHT HEART CATH N/A 04/27/2021   Procedure: RIGHT HEART CATH;  Surgeon: Jolaine Artist, MD;  Location: [facility];  Service: Cardiovascular;  Laterality: N/A;   RIGHT/LEFT HEART CATH AND CORONARY ANGIOGRAPHY N/A 01/07/2017   Procedure: Right/Left Heart Cath and Coronary Angiography;  Surgeon: Larey Dresser, MD;  Location: [facility];  Service: Cardiovascular;  Laterality: N/A;   ROTATOR CUFF REPAIR     TEE WITHOUT CARDIOVERSION N/A 04/28/2021   Procedure: TRANSESOPHAGEAL ECHOCARDIOGRAM (TEE);  Surgeon: Jolaine Artist, MD;  Location: [facility] ENDOSCOPY;  Service: Cardiovascular;  Laterality: N/A;   TEE WITHOUT CARDIOVERSION N/A 05/18/2021   Procedure: TRANSESOPHAGEAL ECHOCARDIOGRAM (TEE);  Surgeon: Early Osmond, MD;  Location: [facility];  Service: Cardiovascular;  Laterality: N/A;   TOE AMPUTATION Left    US ECHOCARDIOGRAPHY  05/2017   Normal    Family History  Problem Relation Age of Onset   Breast cancer Mother 87       contralateral breast ca dx 50   Coronary artery disease Mother    Hypertension Mother    Hyperlipidemia Mother    Diabetes type II Mother    Lung cancer Mother 53   Diabetes Father    Hypertension Father    Mental illness Sister    Bipolar disorder Other    Depression Other    Schizophrenia Other    Cancer Other        MGF's sisters x2; unknown type    Social History:  reports that she quit smoking about 39 years ago. Her smoking use included cigarettes. She started smoking about 40 years ago. She has never used smokeless tobacco. She reports that she does not drink alcohol and does not use drugs.The patient is alone today.  Allergies:  Allergies  Allergen Reactions   Naproxen Hives and Rash   Celecoxib Other (See Comments)    Stomach pain   Aspirin  Other (See Comments)    CAN NOT TAKE DUE TO ULCERS (documented previously) but takes every day currently (as of 09/29/20) without reported issues Can take coated aspirin   Glucosamine Hives, Swelling and Other (See Comments)    Angioedema    Iron     IV iron transfusion - hives - Feb 2022 hospitalization   Metoclopramide Other (See Comments)    Facial twitching and stuttering   Metolazone Other (See Comments)    Acute renal failure   Other Hives    Strawberry allergy   Shellfish-Derived Products Hives, Swelling and Other (See Comments)    Angioedema    Statins Other (See Comments)    Muscle pain   Strawberry (Diagnostic)    Strawberry Extract    Strawberry Flavor    Strawberry Leaves Extract    Tramadol Nausea And Vomiting         Current Medications: Current Outpatient Medications  Medication Sig Dispense Refill   allopurinol (ZYLOPRIM) 300 MG tablet Take 1 tablet (300 mg total) by mouth daily. 90 tablet 1   busPIRone (BUSPAR) 5 MG tablet Take 1 tablet (5 mg total) by mouth 3 (three) times daily. 270 tablet 1   carvedilol (COREG) 12.5 MG tablet TAKE ONE TABLET BY MOUTH TWICE DAILY 180 tablet 3   clopidogrel (PLAVIX) 75 MG tablet Take 1 tablet by mouth daily.     Continuous Blood Gluc Sensor (FREESTYLE LIBRE 2 SENSOR) MISC USE TO check blood glucose AS DIRECTED AND CHANGE sensor every 14 DAYS  EPINEPHrine 0.3 mg/0.3 mL IJ SOAJ injection Use as directed for life-threatening allergic reaction. 4 Device 3   Evolocumab (REPATHA SURECLICK) 503 MG/ML SOAJ Inject 1 Dose into the skin every 14 (fourteen) days. 6 mL 1   ezetimibe (ZETIA) 10 MG tablet Take 1 tablet (10 mg total) by mouth daily. 90 tablet 3   hydrALAZINE (APRESOLINE) 25 MG tablet Take 1 tablet (25 mg total) by mouth 3 (three) times daily. 90 tablet 6   HYDROcodone-acetaminophen (NORCO) 7.5-325 MG tablet TAKE ONE TABLET BY MOUTH three times daily AS NEEDED FOR moderate pain 90 tablet 0   insulin aspart (NOVOLOG  FLEXPEN) 100 UNIT/ML FlexPen 2-10 U before meals based on SSI. 15 mL 3   Insulin Pen Needle (BD PEN NEEDLE NANO 2ND GEN) 32G X 4 MM MISC Inject 1 each into the skin in the morning, at noon, in the evening, and at bedtime. 600 each 3   isosorbide mononitrate (IMDUR) 30 MG 24 hr tablet Take 1 tablet by mouth daily.     lactulose (CHRONULAC) 10 GM/15ML solution Take 15 mLs (10 g total) by mouth daily as needed for mild constipation. 236 mL 1   latanoprost (XALATAN) 0.005 % ophthalmic solution Place 1 drop into both eyes at bedtime.     LEVEMIR FLEXPEN 100 UNIT/ML FlexPen inejct 50 units into THE SKIN daily 15 mL 1   LORazepam (ATIVAN) 0.5 MG tablet TAKE ONE TABLET BY MOUTH ONCE DAILY AS NEEDED FOR ANXIETY 30 tablet 2   magnesium oxide (MAG-OX) 400 MG tablet Take 2 tablets (800 mg total) by mouth daily. Take 2 (400 mg) daily=800 mg 60 tablet 1   nitroGLYCERIN (NITROSTAT) 0.4 MG SL tablet Place 1 tablet (0.4 mg total) under the tongue every 5 (five) minutes as needed for chest pain. 25 tablet 3   Omega-3 Fatty Acids (FISH OIL PO) Take 1 capsule by mouth daily.     OXYGEN Inhale 2 L into the lungs at bedtime.     pantoprazole (PROTONIX) 40 MG tablet Take 1 tablet (40 mg total) by mouth daily. 90 tablet 1   Potassium Chloride ER 20 MEQ TBCR Take 1 tablet by mouth daily.     Probiotic CAPS Take 1 capsule by mouth daily.     promethazine (PHENERGAN) 25 MG tablet Take 1 tablet (25 mg total) by mouth as needed. 30 tablet 3   torsemide (DEMADEX) 20 MG tablet TAKE 4 TABLETS BY MOUTH ONCE DAILY 336 tablet 3   No current facility-administered medications for this visit.

## 2022-04-25 NOTE — Assessment & Plan Note (Addendum)
Chronic anemia that has been worsening. This is anemia of chronic kidney disease/anemia of chronic disease, for which she has been on epoetin since March.  She did not have a robust response, so we recommended epoetin 20,000 units every 2 weeks.  Repeatrepeat iron studies in September did not reveal evidence of iron deficiency, so we continued epoetin every 2 weeks.   She has had improvement in her hemoglobin at this time.  We will continue epoetin every 2 weeks.  She will have a CBC monthly.  I will plan to see her back in 3 months with a CBC and comprehensive metabolic panel for repeat clinical assessment

## 2022-04-25 NOTE — Progress Notes (Signed)
Subjective:  Patient ID: Dana [name], female    DOB: 09-Jul-1961  Age: 61 y.o. MRN: [id number]  Chief Complaint  Patient presents with   Hyperlipidemia   HPI:  Diabetes: Managed by Dr. Tamala Julian Complications: neuropathy.  Glucose checking:free style libre  Glucose logs:200s Hypoglycemia:60s  a couple of times Most recent A1C: 8.2 Current medications: Levermir 50 units daily, Novolog Ssi before meals.  Last Eye Exam: 11/2021 Foot checks: daily  Hyperlipidemia: Current medications: Repatha 140 mg weekly, Zetia 10 mg daily, fish oil 1000 mg daily.   Depression: buspar 5 mg three times a day and lorazepam 0.5 mg once daily,   Hypertension: Complications: chf, cad, ckd Current medications: Carvedilol 12.5 mg three times daily.hydralazine 25 mg three times a day, imdur 30 mg daily, torsemide 20 mg 4 daily, potassium 20 meq once daily, plavix 75 mg qd, Due to see Dr. Evalina Field in 3 weeks at [facility]. Headache ntg on hand.  Hypomagnesium: mag oxide 400 mg 2 daily.   GOUT: allopurinol 300 mg once daily.  Complaining of joint pain in hands and feet. Patient scheduled to see Dr Estanislado Pandy in January 2024. GERD: protonix 40 mg once daily.   B12 deficiency:  B12 gummies 1200 mg daily.  Diet: healthy Exercise: chair  Chronic respiratory failure with hypoxia and OSA: On cpap and 2 L oxygen at night.   Current Outpatient Medications on File Prior to Visit  Medication Sig Dispense Refill   allopurinol (ZYLOPRIM) 300 MG tablet Take 1 tablet (300 mg total) by mouth daily. 90 tablet 1   busPIRone (BUSPAR) 5 MG tablet Take 1 tablet (5 mg total) by mouth 3 (three) times daily. 270 tablet 1   carvedilol (COREG) 12.5 MG tablet TAKE ONE TABLET BY MOUTH TWICE DAILY 180 tablet 3   clopidogrel (PLAVIX) 75 MG tablet Take 1 tablet by mouth daily.     Continuous Blood Gluc Sensor (FREESTYLE LIBRE 2 SENSOR) MISC USE TO check blood glucose AS DIRECTED AND CHANGE sensor every 14  DAYS     EPINEPHrine 0.3 mg/0.3 mL IJ SOAJ injection Use as directed for life-threatening allergic reaction. 4 Device 3   Evolocumab (REPATHA SURECLICK) 893 MG/ML SOAJ Inject 1 Dose into the skin every 14 (fourteen) days. 6 mL 1   ezetimibe (ZETIA) 10 MG tablet Take 1 tablet (10 mg total) by mouth daily. 90 tablet 3   hydrALAZINE (APRESOLINE) 25 MG tablet Take 1 tablet (25 mg total) by mouth 3 (three) times daily. 90 tablet 6   insulin aspart (NOVOLOG FLEXPEN) 100 UNIT/ML FlexPen 2-10 U before meals based on SSI. 15 mL 3   Insulin Pen Needle (BD PEN NEEDLE NANO 2ND GEN) 32G X 4 MM MISC Inject 1 each into the skin in the morning, at noon, in the evening, and at bedtime. 600 each 3   isosorbide mononitrate (IMDUR) 30 MG 24 hr tablet Take 1 tablet by mouth daily.     lactulose (CHRONULAC) 10 GM/15ML solution Take 15 mLs (10 g total) by mouth daily as needed for mild constipation. 236 mL 1   latanoprost (XALATAN) 0.005 % ophthalmic solution Place 1 drop into both eyes at bedtime.     LEVEMIR FLEXPEN 100 UNIT/ML FlexPen inejct 50 units into THE SKIN daily 15 mL 1   magnesium oxide (MAG-OX) 400 MG tablet Take 2 tablets (800 mg total) by mouth daily. Take 2 (400 mg) daily=800 mg 60 tablet 1   nitroGLYCERIN (NITROSTAT) 0.4 MG SL tablet Place  1 tablet (0.4 mg total) under the tongue every 5 (five) minutes as needed for chest pain. 25 tablet 3   Omega-3 Fatty Acids (FISH OIL PO) Take 1 capsule by mouth daily.     OXYGEN Inhale 2 L into the lungs at bedtime.     pantoprazole (PROTONIX) 40 MG tablet Take 1 tablet (40 mg total) by mouth daily. 90 tablet 1   Potassium Chloride ER 20 MEQ TBCR Take 1 tablet by mouth daily.     Probiotic CAPS Take 1 capsule by mouth daily.     promethazine (PHENERGAN) 25 MG tablet Take 1 tablet (25 mg total) by mouth as needed. 30 tablet 3   torsemide (DEMADEX) 20 MG tablet TAKE 4 TABLETS BY MOUTH ONCE DAILY 336 tablet 3   No current facility-administered medications on file  prior to visit.   Past Medical History:  Diagnosis Date   Acquired absence of left great toe ([place])    Acquired absence of other left toe(s) ([place])    ACS (acute coronary syndrome) ([place])    Anemia    Anoxic brain injury ([place])    Anxiety    CAD (coronary artery disease)    DES mLAD 04/07/15; DES dRCA & DES pPDA 08/30/16; DES mCX & PTCA OM1 09/29/20   Chronic diastolic (congestive) heart failure (HCC)    Chronic pain    Chronic systolic (congestive) heart failure (HCC)    CKD (chronic kidney disease)    Contrast dye induced nephropathy, possible 09/03/2020   COPD (chronic obstructive pulmonary disease) (HCC)    Diabetes (HCC)    Diastolic CHF ([place])    Dyspnea    Family history of breast cancer 10/23/2021   Gastro-esophageal reflux disease without esophagitis    Hyperlipidemia    Hypertension    Hypertensive heart disease with heart failure (HCC)    Hypoxemia    Idiopathic gout, multiple sites    Leukocytosis 03/29/2022   Localized edema    Low back pain    Mitral regurgitation    Mitral regurgitation    Mixed hyperlipidemia    Mixed incontinence    Morbid (severe) obesity due to excess calories (HCC)    Neuromuscular disorder (HCC)    Neuropathy    Non-ST elevation (NSTEMI) myocardial infarction ([place])    08/29/20, 09/29/20   OSA (obstructive sleep apnea) 09/03/2020   Other specified hypothyroidism    PAF (paroxysmal atrial fibrillation) (HCC)    s/p pulmonary vein isolation by cryoablation 10/04/15 Dr. Minna Merritts in [facility]   PEA (Pulseless electrical activity) [facility])    ~ 01/13/21 at [facility] during admission DKA, volume overload, possible CAP, hypoxia s/p ACLS with ROSC ~ 10-15   Pneumonia    Primary insomnia    Rheumatoid arthritis ([place])    Stroke ([place])    Thyroid disease    Type 2 diabetes mellitus with diabetic nephropathy ([place])    Vitamin D deficiency    Past Surgical History:  Procedure Laterality Date   ABDOMINAL HYSTERECTOMY     Partial- Still has both ovaries    CARDIOVASCULAR STRESS TEST  08/13/2014   Nuclear; Normal   CARPAL TUNNEL RELEASE     CATARACT EXTRACTION, BILATERAL     CESAREAN SECTION     CHOLECYSTECTOMY     CORONARY ANGIOPLASTY WITH STENT PLACEMENT     3 blockages/ 1 stent   CORONARY STENT INTERVENTION N/A 09/29/2020   Procedure: CORONARY STENT INTERVENTION;  Surgeon: Sherren Mocha, MD;  Location: [place] [facility];  Service: Cardiovascular;  Laterality: N/A;  CFX   EYE SURGERY     LEFT HEART CATH AND CORONARY ANGIOGRAPHY N/A 09/29/2020   Procedure: LEFT HEART CATH AND CORONARY ANGIOGRAPHY;  Surgeon: Sherren Mocha, MD;  Location: [place] [facility];  Service: Cardiovascular;  Laterality: N/A;   MITRAL VALVE REPAIR N/A 05/18/2021   Procedure: MITRAL VALVE REPAIR;  Surgeon: Early Osmond, MD;  Location: [place] [facility];  Service: Cardiovascular;  Laterality: N/A;   RIGHT HEART CATH N/A 04/27/2021   Procedure: RIGHT HEART CATH;  Surgeon: Jolaine Artist, MD;  Location: [place] [facility];  Service: Cardiovascular;  Laterality: N/A;   RIGHT/LEFT HEART CATH AND CORONARY ANGIOGRAPHY N/A 01/07/2017   Procedure: Right/Left Heart Cath and Coronary Angiography;  Surgeon: Larey Dresser, MD;  Location: [place] [facility];  Service: Cardiovascular;  Laterality: N/A;   ROTATOR CUFF REPAIR     TEE WITHOUT CARDIOVERSION N/A 04/28/2021   Procedure: TRANSESOPHAGEAL ECHOCARDIOGRAM (TEE);  Surgeon: Jolaine Artist, MD;  Location[facility];  Service: Cardiovascular;  Laterality: N/A;   TEE WITHOUT CARDIOVERSION N/A 05/18/2021   Procedure: TRANSESOPHAGEAL ECHOCARDIOGRAM (TEE);  Surgeon: Early Osmond, MD;  Location: [place] [facility];  Service: Cardiovascular;  Laterality: N/A;   TOE AMPUTATION Left    US ECHOCARDIOGRAPHY  05/2017   Normal    Family History  Problem Relation Age of Onset   Breast cancer Mother 11       contralateral breast ca dx 37   Coronary artery disease Mother    Hypertension Mother    Hyperlipidemia  Mother    Diabetes type II Mother    Lung cancer Mother 71   Diabetes Father    Hypertension Father    Mental illness Sister    Bipolar disorder Other    Depression Other    Schizophrenia Other    Cancer Other        MGF's sisters x2; unknown type   Social History   Socioeconomic History   Marital status: Married    Spouse name: [name]   Number of children: 2   Years of education: 12   Highest education level: Master's degree (e.g., MA, MS, MEng, MEd, MSW, MBA)  Occupational History   Occupation: on disability/retired early former Education officer, [employer]  Tobacco Use   Smoking status: Former    Types: Cigarettes    Start date: 1983    Quit date: 1984    Years since quitting: 39.8   Smokeless tobacco: Never  Vaping Use   Vaping Use: Never used  Substance and Sexual Activity   Alcohol use: Never   Drug use: Never   Sexual activity: Not on file  Other Topics Concern   Not on file  Social History Narrative   Not on file   Social Determinants of Health   Financial Resource Strain: Low Risk  (12/21/2021)   Overall Financial Resource Strain (CARDIA)    Difficulty of Paying Living Expenses: Not hard at all  Food Insecurity: No Food Insecurity (04/27/2021)   Hunger Vital Sign    Worried About Running Out of Food in the Last Year: Never true    Ran Out of Food in the Last Year: Never true  Transportation Needs: No Transportation Needs (12/21/2021)   PRAPARE - Hydrologist (Medical): No    Lack of Transportation (Non-Medical): No  Physical Activity: Insufficiently Active (02/15/2020)   Exercise Vital Sign    Days of Exercise per  Week: 3 days    Minutes of Exercise per Session: 40 min  Stress: Not on file  Social Connections: Not on file    Review of Systems  Constitutional:  Positive for chills and fatigue. Negative for fever.  HENT:  Negative for congestion, ear pain and sore throat.   Respiratory:  Negative for cough and shortness of breath.    Cardiovascular:  Positive for palpitations. Negative for chest pain and leg swelling.  Gastrointestinal:  Negative for abdominal pain, constipation, diarrhea, nausea and vomiting.  Endocrine: Positive for polydipsia. Negative for polyphagia and polyuria.  Genitourinary:  Positive for dysuria. Negative for frequency and urgency.  Musculoskeletal:  Positive for arthralgias and back pain. Negative for myalgias.  Neurological:  Negative for dizziness and headaches.  Psychiatric/Behavioral:  Negative for agitation, dysphoric mood and sleep disturbance. The patient is not nervous/anxious.      Objective:  BP 130/62 (BP Location: Right Arm, Patient Position: Sitting, Cuff Size: Large)   Pulse 79   Temp (!) 97.5 F (36.4 C) (Temporal)   Ht '5\' 4"'$  (1.626 m)   Wt 234 lb (106.1 kg)   SpO2 98%   BMI 40.17 kg/m      04/30/2022   11:28 AM 04/26/2022   10:39 AM 04/25/2022    7:30 AM  BP/Weight  Systolic BP 354 656 812  Diastolic BP 50 74 62  Wt. (Lbs) 243 238.4 234  BMI 41.71 kg/m2 40.92 kg/m2 40.17 kg/m2    Physical Exam Vitals reviewed.  Constitutional:      Appearance: Normal appearance. She is obese.  Neck:     Vascular: No carotid bruit.  Cardiovascular:     Rate and Rhythm: Normal rate and regular rhythm.     Heart sounds: Normal heart sounds.  Pulmonary:     Effort: Pulmonary effort is normal.     Breath sounds: Normal breath sounds.  Abdominal:     General: Abdomen is flat. Bowel sounds are normal.     Palpations: Abdomen is soft.     Tenderness: There is no abdominal tenderness.  Neurological:     Mental Status: She is alert and oriented to person, place, and time.  Psychiatric:        Mood and Affect: Mood normal.        Behavior: Behavior normal.     Diabetic Foot Exam - Simple   No data filed      Lab Results  Component Value Date   WBC 11.4 04/26/2022   HGB 10.3 (A) 04/26/2022   HCT 32 (A) 04/26/2022   PLT 257 04/26/2022   GLUCOSE 130 (H) 04/25/2022    CHOL 87 (L) 04/25/2022   TRIG 91 04/25/2022   HDL 54 04/25/2022   LDLCALC 15 04/25/2022   ALT 16 04/25/2022   AST 16 04/25/2022   NA 141 04/25/2022   K 3.9 04/25/2022   CL 99 04/25/2022   CREATININE 2.16 (H) 04/25/2022   BUN 58 (H) 04/25/2022   CO2 23 04/25/2022   TSH 2.900 01/19/2022   INR 1.1 05/16/2021   HGBA1C 8.2 (H) 01/19/2022      Assessment & Plan:   Problem List Items Addressed This Visit       Cardiovascular and Mediastinum   Coronary artery disease of native artery of native heart with stable angina pectoris ([place]) - Primary    Continue plavix, aspirin, repatha,zetia.  Recommend continue to work on eating healthy diet and exercise.       Relevant Orders  Cardiovascular Risk Assessment (Completed)   Hypertensive heart and renal disease with congestive heart failure ([place])    Well controlled.  No changes to medicines. Carvedilol 12.5 mg three times daily.hydralazine 25 mg three times a day, imdur 30 mg daily, torsemide 20 mg 4 daily, potassium 20 meq once daily, plavix 75 mg q, Continue to work on eating a healthy diet and exercise.  Labs drawn today.        Relevant Orders   CBC with Differential/Platelet (Completed)   Comprehensive metabolic panel (Completed)   Lipid panel (Completed)     Respiratory   Chronic respiratory failure with hypoxia (HCC)    Continue oxygen 2 L at night.       OSA on CPAP    Continue cpap.         Digestive   GERD without esophagitis    The current medical regimen is effective;  continue present plan and medications. Continue protonix 40 mg daily.         Endocrine   Diabetic polyneuropathy associated with type 2 diabetes mellitus ([place])    Control: good Recommend check sugars fasting daily. Recommend check feet daily. Recommend annual eye exams. Medicines: Levermir 50 units daily, Novolog Ssi before meals.  Continue to work on eating a healthy diet and exercise.           Musculoskeletal and Integument    Drug-induced myopathy    Intolerant to statins.         Other   Vitamin D insufficiency    The current medical regimen is effective;  continue present plan and medications.       Relevant Orders   VITAMIN D 25 Hydroxy (Vit-D Deficiency, Fractures) (Completed)   Mixed hyperlipidemia    Continue Repatha 140 mg weekly, Zetia 10 mg daily, fish oil 1000 mg daily.  Recommend continue to work on eating healthy diet and exercise.      .  No orders of the defined types were placed in this encounter.   Orders Placed This Encounter  Procedures   CBC with Differential/Platelet   Comprehensive metabolic panel   Lipid panel   VITAMIN D 25 Hydroxy (Vit-D Deficiency, Fractures)   Cardiovascular Risk Assessment    Total time spent on today's visit was greater than 30 minutes, including both face-to-face time and nonface-to-face time personally spent on review of chart (labs and imaging), discussing labs and goals, discussing further work-up, treatment options, referrals to specialist if needed, reviewing outside records of pertinent, answering patient's questions, and coordinating care.  Follow-up: Return in about 3 months (around 07/26/2022) for chronic fasting.  An After Visit Summary was printed and given to the patient.  [name], MD [facility] [phone]

## 2022-04-25 NOTE — Assessment & Plan Note (Addendum)
Leukocytosis of uncertain etiology.  She does not have signs of current infection and has not received corticosteroids.  This may be a result of other medication.  The leukocytosis is improving.  We will continue to monitor this.

## 2022-04-25 NOTE — Patient Instructions (Signed)
    This is a list of the screening recommended for you and due dates:  Health Maintenance  Topic Date Due   Tetanus Vaccine  Never done   Zoster (Shingles) Vaccine (1 of 2) Never done   COVID-19 Vaccine (4 - Moderna risk series) 07/04/2021   Yearly kidney health urinalysis for diabetes  06/27/2022   Complete foot exam   07/11/2022   Hemoglobin A1C  07/22/2022   Eye exam for diabetics  11/14/2022   Mammogram  03/15/2023   Yearly kidney function blood test for diabetes  03/30/2023   Colon Cancer Screening  04/03/2026   Flu Shot  Completed   Hepatitis C Screening: USPSTF Recommendation to screen - Ages 18-79 yo.  Completed   HIV Screening  Completed   HPV Vaccine  Aged Out   Pap Smear  Discontinued

## 2022-04-26 ENCOUNTER — Inpatient Hospital Stay (INDEPENDENT_AMBULATORY_CARE_PROVIDER_SITE_OTHER): Payer: HMO | Admitting: Hematology and Oncology

## 2022-04-26 ENCOUNTER — Ambulatory Visit: Payer: HMO

## 2022-04-26 ENCOUNTER — Inpatient Hospital Stay: Payer: HMO

## 2022-04-26 ENCOUNTER — Encounter: Payer: Self-pay | Admitting: Hematology and Oncology

## 2022-04-26 VITALS — BP 172/74 | HR 73 | Temp 97.6°F | Resp 20 | Ht 64.0 in | Wt 238.4 lb

## 2022-04-26 DIAGNOSIS — D631 Anemia in chronic kidney disease: Secondary | ICD-10-CM

## 2022-04-26 DIAGNOSIS — D72829 Elevated white blood cell count, unspecified: Secondary | ICD-10-CM | POA: Diagnosis not present

## 2022-04-26 DIAGNOSIS — N1832 Chronic kidney disease, stage 3b: Secondary | ICD-10-CM

## 2022-04-26 DIAGNOSIS — N189 Chronic kidney disease, unspecified: Secondary | ICD-10-CM

## 2022-04-26 LAB — COMPREHENSIVE METABOLIC PANEL
ALT: 16 IU/L (ref 0–32)
AST: 16 IU/L (ref 0–40)
Albumin/Globulin Ratio: 1.1 — ABNORMAL LOW (ref 1.2–2.2)
Albumin: 3.9 g/dL (ref 3.8–4.9)
Alkaline Phosphatase: 177 IU/L — ABNORMAL HIGH (ref 44–121)
BUN/Creatinine Ratio: 27 — ABNORMAL HIGH (ref 9–23)
BUN: 58 mg/dL — ABNORMAL HIGH (ref 6–24)
Bilirubin Total: 0.4 mg/dL (ref 0.0–1.2)
CO2: 23 mmol/L (ref 20–29)
Calcium: 9.3 mg/dL (ref 8.7–10.2)
Chloride: 99 mmol/L (ref 96–106)
Creatinine, Ser: 2.16 mg/dL — ABNORMAL HIGH (ref 0.57–1.00)
Globulin, Total: 3.6 g/dL (ref 1.5–4.5)
Glucose: 130 mg/dL — ABNORMAL HIGH (ref 70–99)
Potassium: 3.9 mmol/L (ref 3.5–5.2)
Sodium: 141 mmol/L (ref 134–144)
Total Protein: 7.5 g/dL (ref 6.0–8.5)
eGFR: 26 mL/min/{1.73_m2} — ABNORMAL LOW (ref >59)

## 2022-04-26 LAB — CBC WITH DIFFERENTIAL/PLATELET
Basophils Absolute: 0.1 10*3/uL (ref 0.0–0.2)
Basos: 0 %
EOS (ABSOLUTE): 0.3 10*3/uL (ref 0.0–0.4)
Eos: 3 %
Hematocrit: 32.5 % — ABNORMAL LOW (ref 34.0–46.6)
Hemoglobin: 10.4 g/dL — ABNORMAL LOW (ref 11.1–15.9)
Immature Grans (Abs): 0 10*3/uL (ref 0.0–0.1)
Immature Granulocytes: 0 %
Lymphocytes Absolute: 2.1 10*3/uL (ref 0.7–3.1)
Lymphs: 15 %
MCH: 26.7 pg (ref 26.6–33.0)
MCHC: 32 g/dL (ref 31.5–35.7)
MCV: 83 fL (ref 79–97)
Monocytes Absolute: 0.7 10*3/uL (ref 0.1–0.9)
Monocytes: 5 %
Neutrophils Absolute: 10.4 10*3/uL — ABNORMAL HIGH (ref 1.4–7.0)
Neutrophils: 77 %
Platelets: 271 10*3/uL (ref 150–450)
RBC: 3.9 x10E6/uL (ref 3.77–5.28)
RDW: 14.1 % (ref 11.7–15.4)
WBC: 13.7 10*3/uL — ABNORMAL HIGH (ref 3.4–10.8)

## 2022-04-26 LAB — CBC AND DIFFERENTIAL
HCT: 32 — AB (ref 36–46)
Hemoglobin: 10.3 — AB (ref 12.0–16.0)
MCV: 84 (ref 81–99)
Neutrophils Absolute: 8.78
Platelets: 257 10*3/uL (ref 150–400)
WBC: 11.4

## 2022-04-26 LAB — CBC: RBC: 3.85 — AB (ref 3.87–5.11)

## 2022-04-26 LAB — CARDIOVASCULAR RISK ASSESSMENT

## 2022-04-26 LAB — LIPID PANEL
Chol/HDL Ratio: 1.6 ratio (ref 0.0–4.4)
Cholesterol, Total: 87 mg/dL — ABNORMAL LOW (ref 100–199)
HDL: 54 mg/dL (ref >39)
LDL Chol Calc (NIH): 15 mg/dL (ref 0–99)
Triglycerides: 91 mg/dL (ref 0–149)
VLDL Cholesterol Cal: 18 mg/dL (ref 5–40)

## 2022-04-26 LAB — VITAMIN D 25 HYDROXY (VIT D DEFICIENCY, FRACTURES): Vit D, 25-Hydroxy: 25.5 ng/mL — ABNORMAL LOW (ref 30.0–100.0)

## 2022-04-27 ENCOUNTER — Encounter: Payer: Self-pay | Admitting: Oncology

## 2022-04-27 ENCOUNTER — Encounter: Payer: Self-pay | Admitting: Hematology and Oncology

## 2022-04-27 ENCOUNTER — Ambulatory Visit: Payer: HMO

## 2022-04-30 ENCOUNTER — Other Ambulatory Visit: Payer: HMO

## 2022-04-30 ENCOUNTER — Inpatient Hospital Stay: Payer: HMO

## 2022-04-30 ENCOUNTER — Other Ambulatory Visit: Payer: Self-pay

## 2022-04-30 ENCOUNTER — Other Ambulatory Visit: Payer: Self-pay | Admitting: Family Medicine

## 2022-04-30 ENCOUNTER — Encounter: Payer: Self-pay | Admitting: Family Medicine

## 2022-04-30 VITALS — BP 120/50 | HR 71 | Temp 98.1°F | Resp 20 | Ht 64.0 in | Wt 243.0 lb

## 2022-04-30 DIAGNOSIS — N390 Urinary tract infection, site not specified: Secondary | ICD-10-CM

## 2022-04-30 DIAGNOSIS — E559 Vitamin D deficiency, unspecified: Secondary | ICD-10-CM

## 2022-04-30 DIAGNOSIS — N2581 Secondary hyperparathyroidism of renal origin: Secondary | ICD-10-CM

## 2022-04-30 DIAGNOSIS — N184 Chronic kidney disease, stage 4 (severe): Secondary | ICD-10-CM | POA: Diagnosis not present

## 2022-04-30 DIAGNOSIS — D631 Anemia in chronic kidney disease: Secondary | ICD-10-CM

## 2022-04-30 MED ORDER — EPOETIN ALFA-EPBX 20000 UNIT/ML IJ SOLN
20000.0000 [IU] | Freq: Once | INTRAMUSCULAR | Status: AC
  Administered 2022-04-30: 20000 [IU] via SUBCUTANEOUS
  Filled 2022-04-30: qty 1

## 2022-04-30 NOTE — Patient Instructions (Signed)

## 2022-05-01 ENCOUNTER — Other Ambulatory Visit: Payer: HMO

## 2022-05-01 ENCOUNTER — Other Ambulatory Visit: Payer: Self-pay

## 2022-05-01 DIAGNOSIS — N2581 Secondary hyperparathyroidism of renal origin: Secondary | ICD-10-CM

## 2022-05-01 DIAGNOSIS — E559 Vitamin D deficiency, unspecified: Secondary | ICD-10-CM

## 2022-05-01 DIAGNOSIS — N184 Chronic kidney disease, stage 4 (severe): Secondary | ICD-10-CM

## 2022-05-01 NOTE — Assessment & Plan Note (Signed)
The current medical regimen is effective;  continue present plan and medications.  

## 2022-05-01 NOTE — Assessment & Plan Note (Signed)
The current medical regimen is effective;  continue present plan and medications. Continue protonix 40 mg daily.  

## 2022-05-01 NOTE — Assessment & Plan Note (Signed)
Continue plavix, aspirin, repatha,zetia.  Recommend continue to work on eating healthy diet and exercise.

## 2022-05-01 NOTE — Assessment & Plan Note (Signed)
Continue Repatha 140 mg weekly, Zetia 10 mg daily, fish oil 1000 mg daily.  Recommend continue to work on eating healthy diet and exercise.

## 2022-05-01 NOTE — Assessment & Plan Note (Signed)
Continue cpap.  

## 2022-05-01 NOTE — Assessment & Plan Note (Signed)
Intolerant to statins. 

## 2022-05-01 NOTE — Assessment & Plan Note (Addendum)
Well controlled.  No changes to medicines. Carvedilol 12.5 mg three times daily.hydralazine 25 mg three times a day, imdur 30 mg daily, torsemide 20 mg 4 daily, potassium 20 meq once daily, plavix 75 mg q, Continue to work on eating a healthy diet and exercise.  Labs drawn today.

## 2022-05-01 NOTE — Assessment & Plan Note (Signed)
Control: good Recommend check sugars fasting daily. Recommend check feet daily. Recommend annual eye exams. Medicines: Levermir 50 units daily, Novolog Ssi before meals.  Continue to work on eating a healthy diet and exercise.

## 2022-05-01 NOTE — Assessment & Plan Note (Signed)
Continue oxygen 2 L at night.  

## 2022-05-02 ENCOUNTER — Ambulatory Visit (HOSPITAL_COMMUNITY)
Admission: RE | Admit: 2022-05-02 | Discharge: 2022-05-02 | Disposition: A | Discharge status: Home / Self Care | Payer: HMO | Source: Ambulatory Visit | Attending: Internal Medicine | Admitting: Internal Medicine

## 2022-05-02 VITALS — BP 160/80 | HR 78 | Wt 234.0 lb

## 2022-05-02 DIAGNOSIS — G4733 Obstructive sleep apnea (adult) (pediatric): Secondary | ICD-10-CM

## 2022-05-02 DIAGNOSIS — R002 Palpitations: Secondary | ICD-10-CM

## 2022-05-02 DIAGNOSIS — Z9889 Other specified postprocedural states: Secondary | ICD-10-CM

## 2022-05-02 DIAGNOSIS — I5030 Unspecified diastolic (congestive) heart failure: Secondary | ICD-10-CM | POA: Insufficient documentation

## 2022-05-02 DIAGNOSIS — I5032 Chronic diastolic (congestive) heart failure: Secondary | ICD-10-CM

## 2022-05-02 DIAGNOSIS — I2489 Other forms of acute ischemic heart disease: Secondary | ICD-10-CM | POA: Insufficient documentation

## 2022-05-02 DIAGNOSIS — N184 Chronic kidney disease, stage 4 (severe): Secondary | ICD-10-CM

## 2022-05-02 DIAGNOSIS — I25118 Atherosclerotic heart disease of native coronary artery with other forms of angina pectoris: Secondary | ICD-10-CM | POA: Insufficient documentation

## 2022-05-02 DIAGNOSIS — I340 Nonrheumatic mitral (valve) insufficiency: Secondary | ICD-10-CM | POA: Diagnosis not present

## 2022-05-02 DIAGNOSIS — I130 Hypertensive heart and chronic kidney disease with heart failure and stage 1 through stage 4 chronic kidney disease, or unspecified chronic kidney disease: Secondary | ICD-10-CM | POA: Diagnosis not present

## 2022-05-02 DIAGNOSIS — Z95818 Presence of other cardiac implants and grafts: Secondary | ICD-10-CM

## 2022-05-02 DIAGNOSIS — I2510 Atherosclerotic heart disease of native coronary artery without angina pectoris: Secondary | ICD-10-CM

## 2022-05-02 DIAGNOSIS — N1832 Chronic kidney disease, stage 3b: Secondary | ICD-10-CM

## 2022-05-02 DIAGNOSIS — E1022 Type 1 diabetes mellitus with diabetic chronic kidney disease: Secondary | ICD-10-CM

## 2022-05-02 NOTE — Progress Notes (Signed)
Advanced Heart Failure Clinic Note   PCP: [name], MD PCP-Cardiologist: [name], MD   Reason for Visit: f/u chronic diastolic HF  HPI: Dana [name] is 61 y.o. female with history of diastolic heart failure s/p cardiomems 02/2018, CAD, s/p DES RCA and LAD 2018, DMI, htn, hyperlipidemia, PAD, and OSA on CPAP, PAF s/p ablation, PAD s/p L great toe and 4th toe amputation, stage IIIb CKD.   Admitted 03/22 with NSTEMI and a/c CHF. LHC 03/22 showed moderate mid-LAD and mid-RCA disease, severe mid-L-Cx stenosis s/p PCI/DES mid Cx and side branch angioplasty of OM1.   Unfortunately she developed a contrast-induced nephropathy after cath and creatinine increased from 2.2 to a peak of 5.5. Additionally, she developed severe nausea and vomiting, gastroparesis and required IV antiplatelet therapy.  During this time she developed epistaxis and acute blood loss anemia requiring transfusion.  She was switched back to oral DAPT. Diuretics were reintroduced and her creatinine began to improve over the following days. Her leukocytosis persisted but all her infectious workup was negative and this was felt to be reactive.    Per chart review, she was admitted 6/22 to [facility] for DKA, started on insulin gtt, IVF and diuretics held. Developed pulmonary vascular congestion vs PNA and was started on IV abx, and diuretics were resumed. She suffered a PEA arrest 01/12/21 with ROSC after 10-15 minutes of CPR and was transferred to [place]; precipitating factor for PEA arrest thought to be respiratory driven. On arrival, she was still in DKA, AKI on CKD with oliguria and Nephrology was consulted for further management. TTE with EF 60%, min MR/TR. Concern for stroke due to left-sided weakness and MRI confirmed anoxic brain injury, but no definite infarct. She was extubated, SCr returned to baseline of 1.6-1.8 and she was discharged to rehab facility.   Zio placed 8/22 for palpitations, showing mostly SB/SR no afib, 66  SVT runs, frequent isolated SVE (11.2%).  Admitted from AHF clinic 10/22 for A/C CHF after failing outpatient escalation in diuretic therapy. She was enrolled in FASTR trial and diuresed > 15 lb.  Hospitalization complicated by AKI on CKD, Scr up to 3.13. RHC showed mild to moderate elevating filling pressures with large v-waves in PCWP tracing suggesting severe MR vs severe diastolic dysfunction. TEE 55-60% showed severe central MR.   Patient underwent TEER of mitral valve with placement of 1XT W and 1NT W clip on 05/18/2021. NT W clip was entangled and had to be deployed into lateral part of valve. No additional clip placement could be preformed. Echo 11/04 with EF 50-55% and residual moderate MR. Given 1 dose IV lasix prior to discharge.  Echo 3/23 showed EF 55-60%, RV OK, moderate MR mean gradient 7-8 mmHg. Saw [name] and MR now moderate, no indication for 3rd clip.   Today she returns for HF follow up with her husband. Feels ok. Tired a lot. Hgb has been running 10-10.4. Now on procrit every other week. Mild edema off/on. Doesn't need extra torsemide. Following with cardiomems. Recent reading reviewed and look good. No recent CP. SBP 130s at home.   Cardiac Studies: - Echo (3/23): EF 55-60%, RV normal, moderate MR  - Echo 11/22: EF 50-55%, residual moderate MR  - RHC (10/22):   RA = 8 RV = 59/9 PA = 61/17 (35) PCW = mean 23 (v = 49) -> performed multiple wedges Fick cardiac output/index = PVR = 1.6 WU FA sat = 98% PA sat = 69%, 70% PaPi = 5.5 Assessment:  1. Mild to moderately elevated filling pressures with very large v-waves in PCWP tracing suggestive of severe diastolic dysfunction vs severe MR  - TEE (10/22): EF 55-60%, basal inferior akinesis, severe central MR. - Echo (10/22): EF 65-70%, RV ok, moderate MR - Echo (5/21): EF 60-65%, RV ok, Grade II DD. - Echo (2/22): EF 55-60%, RV ok, Grade II DD, mod MR - Echo (3/22): EF 55-60%, Grade II DD, normal RV - Echo (6/22):  EF 60%, mod MR/TR (per chart review at [place]). - Zio (8/19): no arrhythmias, occasional PAC/PVCs - Zio (9/22): mostly SB/SR no afib, 66 SVT runs, frequent isolated SVE (11.2%).  - LHC (3/22): 3 vessel CAD with moderate mid-LAD and mid-RCA stenoses, and severe mid-LCx stenosis; successful PCI of the mid-circumflex, reducing severe 90% stenosis to 0% with a 2.75x22 mm Resolute Onyx DES, provisional side branch angioplasty of the OM1 with a 2.5 mm balloon through the stent struts  Review of systems complete and found to be negative unless listed in HPI.    Past Medical History:  Diagnosis Date   Acquired absence of left great toe (HCC)    Acquired absence of other left toe(s) ([place])    ACS (acute coronary syndrome) ([place])    Anemia    Anoxic brain injury ([place])    Anxiety    CAD (coronary artery disease)    DES mLAD 04/07/15; DES dRCA & DES pPDA 08/30/16; DES mCX & PTCA OM1 09/29/20   Chronic diastolic (congestive) heart failure (HCC)    Chronic pain    Chronic systolic (congestive) heart failure (HCC)    CKD (chronic kidney disease)    Contrast dye induced nephropathy, possible 09/03/2020   COPD (chronic obstructive pulmonary disease) (HCC)    Diabetes (HCC)    Diastolic CHF (HCC)    Dyspnea    Family history of breast cancer 10/23/2021   Gastro-esophageal reflux disease without esophagitis    Hyperlipidemia    Hypertension    Hypertensive heart disease with heart failure (HCC)    Hypoxemia    Idiopathic gout, multiple sites    Leukocytosis 03/29/2022   Localized edema    Low back pain    Mitral regurgitation    Mitral regurgitation    Mixed hyperlipidemia    Mixed incontinence    Morbid (severe) obesity due to excess calories (HCC)    Neuromuscular disorder (HCC)    Neuropathy    Non-ST elevation (NSTEMI) myocardial infarction ([place])    08/29/20, 09/29/20   OSA (obstructive sleep apnea) 09/03/2020   Other specified hypothyroidism    PAF (paroxysmal atrial fibrillation) (HCC)     s/p pulmonary vein isolation by cryoablation 10/04/15 Dr. Minna Merritts in [facility]   PEA (Pulseless electrical activity) [facility]    ~ 01/13/21 at [facility] during admission DKA, volume overload, possible CAP, hypoxia s/p ACLS with ROSC ~ 10-15   Pneumonia    Primary insomnia    Rheumatoid arthritis ([place])    Stroke ([place])    Thyroid disease    Type 2 diabetes mellitus with diabetic nephropathy (HCC)    Vitamin D deficiency     Current Outpatient Medications  Medication Sig Dispense Refill   allopurinol (ZYLOPRIM) 300 MG tablet Take 1 tablet (300 mg total) by mouth daily. 90 tablet 1   busPIRone (BUSPAR) 5 MG tablet Take 1 tablet (5 mg total) by mouth 3 (three) times daily. 270 tablet 1   carvedilol (COREG) 12.5 MG tablet TAKE ONE TABLET BY MOUTH TWICE DAILY 180  tablet 3   clopidogrel (PLAVIX) 75 MG tablet Take 1 tablet by mouth daily.     Continuous Blood Gluc Sensor (FREESTYLE LIBRE 2 SENSOR) MISC USE TO check blood glucose AS DIRECTED AND CHANGE sensor every 14 DAYS     EPINEPHrine 0.3 mg/0.3 mL IJ SOAJ injection Use as directed for life-threatening allergic reaction. 4 Device 3   Evolocumab (REPATHA SURECLICK) 433 MG/ML SOAJ Inject 1 Dose into the skin every 14 (fourteen) days. 6 mL 1   ezetimibe (ZETIA) 10 MG tablet Take 1 tablet (10 mg total) by mouth daily. 90 tablet 3   hydrALAZINE (APRESOLINE) 25 MG tablet Take 1 tablet (25 mg total) by mouth 3 (three) times daily. 90 tablet 6   HYDROcodone-acetaminophen (NORCO) 7.5-325 MG tablet TAKE ONE TABLET BY MOUTH three times daily AS NEEDED FOR moderate pain 90 tablet 0   insulin aspart (NOVOLOG FLEXPEN) 100 UNIT/ML FlexPen 2-10 U before meals based on SSI. 15 mL 3   Insulin Pen Needle (BD PEN NEEDLE NANO 2ND GEN) 32G X 4 MM MISC Inject 1 each into the skin in the morning, at noon, in the evening, and at bedtime. 600 each 3   isosorbide mononitrate (IMDUR) 30 MG 24 hr tablet Take 1 tablet by mouth daily.     lactulose (CHRONULAC) 10 GM/15ML  solution Take 15 mLs (10 g total) by mouth daily as needed for mild constipation. 236 mL 1   latanoprost (XALATAN) 0.005 % ophthalmic solution Place 1 drop into both eyes at bedtime.     LEVEMIR FLEXPEN 100 UNIT/ML FlexPen inejct 50 units into THE SKIN daily 15 mL 1   LORazepam (ATIVAN) 0.5 MG tablet TAKE ONE TABLET BY MOUTH ONCE daily AS NEEDED FOR ANXIETY 30 tablet 2   magnesium oxide (MAG-OX) 400 MG tablet Take 2 tablets (800 mg total) by mouth daily. Take 2 (400 mg) daily=800 mg 60 tablet 1   nitroGLYCERIN (NITROSTAT) 0.4 MG SL tablet Place 1 tablet (0.4 mg total) under the tongue every 5 (five) minutes as needed for chest pain. 25 tablet 3   Omega-3 Fatty Acids (FISH OIL PO) Take 1 capsule by mouth daily.     OXYGEN Inhale 2 L into the lungs at bedtime.     pantoprazole (PROTONIX) 40 MG tablet Take 1 tablet (40 mg total) by mouth daily. 90 tablet 1   Potassium Chloride ER 20 MEQ TBCR Take 1 tablet by mouth daily.     Probiotic CAPS Take 1 capsule by mouth daily.     promethazine (PHENERGAN) 25 MG tablet Take 1 tablet (25 mg total) by mouth as needed. 30 tablet 3   torsemide (DEMADEX) 20 MG tablet TAKE 4 TABLETS BY MOUTH ONCE DAILY 336 tablet 3   No current facility-administered medications for this encounter.   Allergies  Allergen Reactions   Naproxen Hives and Rash   Celecoxib Other (See Comments)    Stomach pain   Aspirin Other (See Comments)    CAN NOT TAKE DUE TO ULCERS (documented previously) but takes every day currently (as of 09/29/20) without reported issues Can take coated aspirin   Glucosamine Hives, Swelling and Other (See Comments)    Angioedema    Iron     IV iron transfusion - hives - Feb 2022 hospitalization   Metoclopramide Other (See Comments)    Facial twitching and stuttering   Metolazone Other (See Comments)    Acute renal failure   Other Hives    Strawberry allergy   Shellfish-Derived  Products Hives, Swelling and Other (See Comments)    Angioedema     Statins Other (See Comments)    Muscle pain   Strawberry (Diagnostic)    Strawberry Extract    Strawberry Flavor    Strawberry Leaves Extract    Tramadol Nausea And Vomiting        Social History   Socioeconomic History   Marital status: Married    Spouse name: Dana [name]   Number of children: 2   Years of education: 16   Highest education level: Master's degree (e.g., MA, Dana, MEng, MEd, MSW, MBA)  Occupational History   Occupation: on disability/retired early former Education officer, museum  Tobacco Use   Smoking status: Former    Types: Cigarettes    Start date: 1983    Quit date: 1984    Years since quitting: 39.8   Smokeless tobacco: Never  Vaping Use   Vaping Use: Never used  Substance and Sexual Activity   Alcohol use: Never   Drug use: Never   Sexual activity: Not on file  Other Topics Concern   Not on file  Social History Narrative   Not on file   Social Determinants of Health   Financial Resource Strain: Low Risk  (12/21/2021)   Overall Financial Resource Strain (CARDIA)    Difficulty of Paying Living Expenses: Not hard at all  Food Insecurity: No Food Insecurity (04/27/2021)   Hunger Vital Sign    Worried About Running Out of Food in the Last Year: Never true    Forrest in the Last Year: Never true  Transportation Needs: No Transportation Needs (12/21/2021)   PRAPARE - Hydrologist (Medical): No    Lack of Transportation (Non-Medical): No  Physical Activity: Insufficiently Active (02/15/2020)   Exercise Vital Sign    Days of Exercise per Week: 3 days    Minutes of Exercise per Session: 40 min  Stress: Not on file  Social Connections: Not on file  Intimate Partner Violence: Not on file   Family History  Problem Relation Age of Onset   Breast cancer Mother 39       contralateral breast ca dx 83   Coronary artery disease Mother    Hypertension Mother    Hyperlipidemia Mother    Diabetes type II Mother    Lung cancer Mother 71    Diabetes Father    Hypertension Father    Mental illness Sister    Bipolar disorder Other    Depression Other    Schizophrenia Other    Cancer Other        MGF's sisters x2; unknown type   BP (!) 160/80   Pulse 78   Wt 106.1 kg (234 lb)   SpO2 97%   BMI 40.17 kg/m   Wt Readings from Last 3 Encounters:  05/02/22 106.1 kg (234 lb)  04/30/22 110.2 kg (243 lb)  04/26/22 108.1 kg (238 lb 6.4 oz)   PHYSICAL EXAM: General:  Well appearing. No resp difficulty HEENT: normal Neck: supple. no JVD. Carotids 2+ bilat; no bruits. No lymphadenopathy or thryomegaly appreciated. Cor: PMI nondisplaced. Regular rate & rhythm. 2/6 TR Lungs: clear Abdomen: soft, nontender, nondistended. No hepatosplenomegaly. No bruits or masses. Good bowel sounds. Extremities: no cyanosis, clubbing, rash, trace edema Neuro: alert & orientedx3, cranial nerves grossly intact. moves all 4 extremities w/o difficulty. Affect pleasant  CardioMEMs: Readings reviewed. All recent readings at [facility]. Personally reviewed   ASSESSMENT & PLAN: 1.  Chronic Diastolic Heart Failure - Echo 08/2020 EF 55-60%  - Echo (3/22): EF 55-60%, Grade II DD, normal RV - Echo (6/22): EF 60%, mod MR/TR (per chart review at [facility]). - RHC (10/22): mild to moderate elevated filling pressures, large v-waves PCWP tracing - TEE (10/22): EF 55-60%, basal inferior akinesis, severe central MR. - Admit 10/22 with a/c CHF, enrolled in FASTR trial and diuresed 15 lbs. Severe MR likely contributing to decompensation.  - Echo (11/22): EF 50-55%, severe LV HK, moderate MR - Stable NYHA II-early III/ Volume status ok on exam and cardiomems - Continue hydralazine 25 mg tid. - Continue torsemide 80 mg daily + 20 KCL daily. - Continue carvedilol 12.5 mg bid. - Continue Imdur 30 mg daily. - No SGLT2i with T1DM. - Continue TED hose. - Labs today   2. CAD - Known CAD with prior stents to mid LAD and pRCA.  - NSTEMI (2/22). Cath deferred due to AKI.  Treated medically.  - NSTEMI (7/22)-->S/p PCI/DES to mid Cx, balloon angioplasty to OM1  - No recent angina  - Continue ASA + Plavix for now. Can consider stopping Plavix at next visit - Lipids under good control w/ Repatha + Zetia (LDL 66 7/22). Followed by Dr. Debara Pickett.    3. CKD IIIb - Prior h/o CIN following LHC - Previous SCr baseline 1.6-1.8 with multiple AKIs over the last year. New baseline ~ 2.0-2.5 - Followed by Nephrology in [facility] - Recent labs reviewed and are stable with Scr 2.16 on 04/25/22  4. Mitral regurgitation - Severe central MR on TEE on 10/22.   - S/p TEER 05/18/2021 - two clips placed, one became entangled and had to be deployed. - Moderate residual MR on echo 11/22. - Repeat echo 12/22 with stable moderate MR, echo 3/23 showed moderate MR. - Structural team following. No indication for re-clip, per patient report. - Has appointment with [name] in 12/23 with echo    5. OSA - Continue CPAP qhs.    6. DM1 - Hgb A1c 6.6 10/22. - No SGLT2i.   7. Chronic Anemia - Baseline 8-9, felt to be 2/2 renal disease.  - Hx severe epistaxis and ABLA requiring transfusion. Hx rash with IV iron. - Following with Heme/Onc. Receiving Retacrit monthly.  8. Palpitations - Hx PAF s/p ablation at least 10 years ago. No documented recurrence. Not currently on anticoagulation. Would need to consider risk/benefits of anticoagulation if recurs especially with history of anemia requiring transfusion - Zio XT 14 day (8/22) showed mostly SB/SR no afib, 66 SVT runs, frequent isolated SVE (11.2%). - Symptomatically much improved - Continue carvedilol 12.5 mg bid.  [name], MD  2:21 PM

## 2022-05-02 NOTE — Patient Instructions (Signed)
There has been no changes to your medications.  Your physician recommends that you schedule a follow-up appointment in: 4 months ( February 2024)  ** please call the office in November to arrange your follow up appointment **  If you have any questions or concerns before your next appointment please send [name] a message through [place] or call our office at [phone].    TO LEAVE A MESSAGE FOR THE NURSE SELECT OPTION 2, PLEASE LEAVE A MESSAGE INCLUDING: YOUR NAME DATE OF BIRTH CALL BACK NUMBER REASON FOR CALL**this is important as we prioritize the call backs  YOU WILL RECEIVE A CALL BACK THE SAME DAY AS LONG AS YOU CALL BEFORE 4:00 PM  At the [facility], you and your health needs are our priority. As part of our continuing mission to provide you with exceptional heart care, we have created designated Provider Care Teams. These Care Teams include your primary Cardiologist (physician) and Advanced Practice Providers (APPs- Physician Assistants and Nurse Practitioners) who all work together to provide you with the care you need, when you need it.   You may see any of the following providers on your designated Care Team at your next follow up: Dr [name] Dr Loralie Champagne Dr. Roxana Hires, NP Lyda Jester, [place] [facility] [place], [place] Forestine Na, NP Audry Riles, PharmD   Please be sure to bring in all your medications bottles to every appointment.

## 2022-05-08 LAB — PARATHYROID HORMONE, INTACT (NO CA): PTH: 129 pg/mL — ABNORMAL HIGH (ref 15–65)

## 2022-05-08 LAB — VITAMIN D, 25-HYDROXY, TOTAL: Vitamin D, 25-Hydroxy, Serum: 28 ng/mL — ABNORMAL LOW

## 2022-05-08 LAB — RENAL FUNCTION PANEL
Albumin: 3.7 g/dL — ABNORMAL LOW (ref 3.8–4.9)
BUN/Creatinine Ratio: 28 — ABNORMAL HIGH (ref 9–23)
BUN: 61 mg/dL — ABNORMAL HIGH (ref 6–24)
CO2: 24 mmol/L (ref 20–29)
Calcium: 9.1 mg/dL (ref 8.7–10.2)
Chloride: 101 mmol/L (ref 96–106)
Creatinine, Ser: 2.15 mg/dL — ABNORMAL HIGH (ref 0.57–1.00)
Glucose: 115 mg/dL — ABNORMAL HIGH (ref 70–99)
Phosphorus: 3.6 mg/dL (ref 3.0–4.3)
Potassium: 4.1 mmol/L (ref 3.5–5.2)
Sodium: 140 mmol/L (ref 134–144)
eGFR: 26 mL/min/{1.73_m2} — ABNORMAL LOW (ref >59)

## 2022-05-10 ENCOUNTER — Ambulatory Visit: Payer: HMO

## 2022-05-11 ENCOUNTER — Ambulatory Visit: Payer: HMO

## 2022-05-14 ENCOUNTER — Inpatient Hospital Stay: Payer: HMO

## 2022-05-16 ENCOUNTER — Other Ambulatory Visit (HOSPITAL_COMMUNITY): Payer: HMO

## 2022-05-16 ENCOUNTER — Ambulatory Visit: Payer: HMO

## 2022-05-17 ENCOUNTER — Inpatient Hospital Stay: Payer: HMO | Attending: Hematology and Oncology

## 2022-05-17 VITALS — BP 131/59 | HR 79 | Temp 98.0°F | Resp 20 | Ht 64.0 in | Wt 235.0 lb

## 2022-05-17 DIAGNOSIS — D631 Anemia in chronic kidney disease: Secondary | ICD-10-CM | POA: Insufficient documentation

## 2022-05-17 DIAGNOSIS — N1832 Chronic kidney disease, stage 3b: Secondary | ICD-10-CM | POA: Insufficient documentation

## 2022-05-17 MED ORDER — EPOETIN ALFA-EPBX 20000 UNIT/ML IJ SOLN
20000.0000 [IU] | Freq: Once | INTRAMUSCULAR | Status: AC
  Administered 2022-05-17: 20000 [IU] via SUBCUTANEOUS
  Filled 2022-05-17: qty 1

## 2022-05-17 NOTE — Patient Instructions (Signed)

## 2022-05-22 ENCOUNTER — Telehealth: Payer: Self-pay

## 2022-05-22 NOTE — Chronic Care Management (AMB) (Signed)
Chronic Care Management Pharmacy Assistant   Name: Dana [name]  MRN: [id number] DOB: 1961/01/07  Reason for Encounter: Disease State/ Diabetes  Recent office visits:  04-25-2022 [name], MD. WBC= 13.7, Hemo= 10.4, Hema= 32.5. Glucose= 130, BUN= 58, Creatinine= 2.16, eGFR= 26, BUN/Creatinine= 27. Albumin/Globulin Ratio= 1.1, Alkaline Phosphatase= 177. Cholesterol= 87. Vit D= 25.5. STOP amoxicillin and B12.   Recent consult visits:  05-17-2022 Lenon Ahmadi, RN (Oncology).  Epoetin alfa injection given.  05-09-2022 Irven Easterly., MD (Endo). Follow up visit. No changes.  05-09-2022 Biagio Quint, MD (Nephrology). START Vitamin D 5000 units daily.  05-02-2022 Bensimhon, Shaune Pascal, MD (Cardiology). Follow up no changes.  04-30-2022 Emmaline Life, RN (Oncology).  Epoetin alfa injection given.  04-26-2022 Mosher, Thalia Bloodgood, PA-C (Oncology). Hemo= 10.3, HCT= 32. RBC= 3.85.  04-16-2022 Emmaline Life, RN (Oncology).  Epoetin alfa injection given.  Hospital visits:  None in previous 6 months  Medications: Outpatient Encounter Medications as of 05/22/2022  Medication Sig   allopurinol (ZYLOPRIM) 300 MG tablet Take 1 tablet (300 mg total) by mouth daily.   busPIRone (BUSPAR) 5 MG tablet Take 1 tablet (5 mg total) by mouth 3 (three) times daily.   carvedilol (COREG) 12.5 MG tablet TAKE ONE TABLET BY MOUTH TWICE DAILY   clopidogrel (PLAVIX) 75 MG tablet Take 1 tablet by mouth daily.   Continuous Blood Gluc Sensor (FREESTYLE LIBRE 2 SENSOR) MISC USE TO check blood glucose AS DIRECTED AND CHANGE sensor every 14 DAYS   EPINEPHrine 0.3 mg/0.3 mL IJ SOAJ injection Use as directed for life-threatening allergic reaction.   Evolocumab (REPATHA SURECLICK) 585 MG/ML SOAJ Inject 1 Dose into the skin every 14 (fourteen) days.   ezetimibe (ZETIA) 10 MG tablet Take 1 tablet (10 mg total) by mouth daily.   hydrALAZINE (APRESOLINE) 25 MG tablet Take 1 tablet (25 mg  total) by mouth 3 (three) times daily.   HYDROcodone-acetaminophen (NORCO) 7.5-325 MG tablet TAKE ONE TABLET BY MOUTH three times daily AS NEEDED FOR moderate pain   insulin aspart (NOVOLOG FLEXPEN) 100 UNIT/ML FlexPen 2-10 U before meals based on SSI.   Insulin Pen Needle (BD PEN NEEDLE NANO 2ND GEN) 32G X 4 MM MISC Inject 1 each into the skin in the morning, at noon, in the evening, and at bedtime.   isosorbide mononitrate (IMDUR) 30 MG 24 hr tablet Take 1 tablet by mouth daily.   lactulose (CHRONULAC) 10 GM/15ML solution Take 15 mLs (10 g total) by mouth daily as needed for mild constipation.   latanoprost (XALATAN) 0.005 % ophthalmic solution Place 1 drop into both eyes at bedtime.   LEVEMIR FLEXPEN 100 UNIT/ML FlexPen inejct 50 units into THE SKIN daily   LORazepam (ATIVAN) 0.5 MG tablet TAKE ONE TABLET BY MOUTH ONCE daily AS NEEDED FOR ANXIETY   magnesium oxide (MAG-OX) 400 MG tablet Take 2 tablets (800 mg total) by mouth daily. Take 2 (400 mg) daily=800 mg   nitroGLYCERIN (NITROSTAT) 0.4 MG SL tablet Place 1 tablet (0.4 mg total) under the tongue every 5 (five) minutes as needed for chest pain.   Omega-3 Fatty Acids (FISH OIL PO) Take 1 capsule by mouth daily.   OXYGEN Inhale 2 L into the lungs at bedtime.   pantoprazole (PROTONIX) 40 MG tablet Take 1 tablet (40 mg total) by mouth daily.   Potassium Chloride ER 20 MEQ TBCR Take 1 tablet by mouth daily.   Probiotic CAPS Take 1 capsule by mouth daily.  promethazine (PHENERGAN) 25 MG tablet Take 1 tablet (25 mg total) by mouth as needed.   torsemide (DEMADEX) 20 MG tablet TAKE 4 TABLETS BY MOUTH ONCE DAILY   No facility-administered encounter medications on file as of 05/22/2022.  Recent Relevant Labs: Lab Results  Component Value Date/Time   HGBA1C 8.2 (H) 01/19/2022 08:05 AM   HGBA1C 6.6 (H) 04/25/2021 12:20 AM    Kidney Function Lab Results  Component Value Date/Time   CREATININE 2.15 (H) 05/01/2022 03:16 PM   CREATININE 2.16 (H)  04/25/2022 08:13 AM   GFRNONAA 26 (L) 02/02/2022 03:36 PM   GFRAA 43 (L) 06/28/2020 10:03 AM     Current antihyperglycemic regimen:  Levemir 50 units daily Novolog 10-20 units before meals   Patient verbally confirms she is taking the above medications as directed. Yes  What recent interventions/DTPs have been made to improve glycemic control:  -Educated on A1c and blood sugar goals; Exercise goal of 150 minutes per week; Prevention and management of hypoglycemic episodes; Carbohydrate counting and/or plate method -Counseled to check feet daily and get yearly eye exams -Counseled on diet and exercise extensively  Have there been any recent hospitalizations or ED visits since last visit with CPP? No  Patient denies hypoglycemic symptoms  Patient denies hyperglycemic symptoms  How often are you checking your blood sugar? Patient uses freestyle Elenor Legato  What are your blood sugars ranging?  Fasting: 11-07 132, 11-06 129, 11-05 120, 11-04 98, 11-03 92, 11-02 73 Before meals: None After meals: None Bedtime: None  On insulin? Yes How many units: 50 and 10-20  During the week, how often does your blood glucose drop below 70? Never  Are you checking your feet daily/regularly? Yes  Adherence Review: Is the patient currently on a STATIN medication? No Is the patient currently on ACE/ARB medication? No Does the patient have >5 day gap between last estimated fill dates? No  Care Gaps: Last eye exam / Retinopathy Screening? 02-2022 Last Annual Wellness Visit?  Last Diabetic Foot Exam? Patient stated Dr. Tamala Julian examined feet [phone] Tdap overdue Shingrix overdue Covid booster overdue  Star Rating Drugs: None  Jeannette How [facility] Clinical Pharmacist Assistant [phone]

## 2022-05-24 ENCOUNTER — Inpatient Hospital Stay: Payer: HMO

## 2022-05-24 DIAGNOSIS — N1832 Chronic kidney disease, stage 3b: Secondary | ICD-10-CM | POA: Diagnosis not present

## 2022-05-24 DIAGNOSIS — D631 Anemia in chronic kidney disease: Secondary | ICD-10-CM

## 2022-05-24 LAB — CBC WITH DIFFERENTIAL (CANCER CENTER ONLY)
Abs Immature Granulocytes: 0.05 10*3/uL (ref 0.00–0.07)
Basophils Absolute: 0.1 10*3/uL (ref 0.0–0.1)
Basophils Relative: 1 %
Eosinophils Absolute: 0.2 10*3/uL (ref 0.0–0.5)
Eosinophils Relative: 2 %
HCT: 31.5 % — ABNORMAL LOW (ref 36.0–46.0)
Hemoglobin: 9.8 g/dL — ABNORMAL LOW (ref 12.0–15.0)
Immature Granulocytes: 0 %
Lymphocytes Relative: 17 %
Lymphs Abs: 2 10*3/uL (ref 0.7–4.0)
MCH: 27.6 pg (ref 26.0–34.0)
MCHC: 31.1 g/dL (ref 30.0–36.0)
MCV: 88.7 fL (ref 80.0–100.0)
Monocytes Absolute: 0.7 10*3/uL (ref 0.1–1.0)
Monocytes Relative: 6 %
Neutro Abs: 8.9 10*3/uL — ABNORMAL HIGH (ref 1.7–7.7)
Neutrophils Relative %: 74 %
Platelet Count: 289 10*3/uL (ref 150–400)
RBC: 3.55 MIL/uL — ABNORMAL LOW (ref 3.87–5.11)
RDW: 15.9 % — ABNORMAL HIGH (ref 11.5–15.5)
WBC Count: 11.9 10*3/uL — ABNORMAL HIGH (ref 4.0–10.5)
nRBC: 0 % (ref 0.0–0.2)

## 2022-05-25 ENCOUNTER — Encounter: Payer: Self-pay | Admitting: Oncology

## 2022-05-28 ENCOUNTER — Inpatient Hospital Stay: Payer: HMO

## 2022-05-30 ENCOUNTER — Encounter: Payer: Self-pay | Admitting: Oncology

## 2022-05-30 ENCOUNTER — Other Ambulatory Visit: Payer: Self-pay | Admitting: Family Medicine

## 2022-05-31 ENCOUNTER — Inpatient Hospital Stay: Payer: HMO

## 2022-05-31 VITALS — BP 117/68 | HR 68 | Temp 98.4°F | Resp 20 | Wt 236.0 lb

## 2022-05-31 DIAGNOSIS — D631 Anemia in chronic kidney disease: Secondary | ICD-10-CM

## 2022-05-31 DIAGNOSIS — N1832 Chronic kidney disease, stage 3b: Secondary | ICD-10-CM | POA: Diagnosis not present

## 2022-05-31 MED ORDER — EPOETIN ALFA-EPBX 20000 UNIT/ML IJ SOLN
20000.0000 [IU] | Freq: Once | INTRAMUSCULAR | Status: AC
  Administered 2022-05-31: 20000 [IU] via SUBCUTANEOUS
  Filled 2022-05-31: qty 1

## 2022-05-31 NOTE — Patient Instructions (Signed)

## 2022-06-06 ENCOUNTER — Other Ambulatory Visit: Payer: Self-pay | Admitting: Family Medicine

## 2022-06-06 ENCOUNTER — Other Ambulatory Visit: Payer: Self-pay | Admitting: Legal Medicine

## 2022-06-11 ENCOUNTER — Ambulatory Visit: Payer: HMO

## 2022-06-14 ENCOUNTER — Inpatient Hospital Stay: Payer: HMO

## 2022-06-14 VITALS — BP 113/58 | HR 64 | Temp 98.4°F | Resp 18 | Ht 64.0 in | Wt 236.0 lb

## 2022-06-14 DIAGNOSIS — D631 Anemia in chronic kidney disease: Secondary | ICD-10-CM

## 2022-06-14 DIAGNOSIS — N1832 Chronic kidney disease, stage 3b: Secondary | ICD-10-CM | POA: Diagnosis not present

## 2022-06-14 MED ORDER — EPOETIN ALFA-EPBX 20000 UNIT/ML IJ SOLN
20000.0000 [IU] | Freq: Once | INTRAMUSCULAR | Status: AC
  Administered 2022-06-14: 20000 [IU] via SUBCUTANEOUS
  Filled 2022-06-14: qty 1

## 2022-06-14 NOTE — Patient Instructions (Signed)

## 2022-06-19 ENCOUNTER — Telehealth: Payer: Self-pay

## 2022-06-19 ENCOUNTER — Telehealth: Payer: HMO

## 2022-06-19 NOTE — Telephone Encounter (Signed)
  Care Management   Follow Up Note   06/19/2022 Name: Dana [name] MRN: [id number] DOB: Jul 16, 1961   Referred by: [name], MD Reason for referral : Chronic Care Management   Successful contact was made with the patient to discuss care management and care coordination services. Patient declines engagement at this time.   Follow Up Plan: The patient has been provided with contact information for the care management team and has been advised to call with any health related questions or concerns.  -Patient was at [facility] when I called for appt. She would like to reschedule  [place] Constable, Pharm.D. - [phone]

## 2022-06-20 ENCOUNTER — Telehealth: Payer: Self-pay

## 2022-06-20 NOTE — Chronic Care Management (AMB) (Signed)
06-20-2022: Rescheduled patient's 06-19-2022 appointment with [place] Constable to 07-11-2022.  [place] Pharmacist Assistant [phone]

## 2022-06-21 ENCOUNTER — Inpatient Hospital Stay: Payer: HMO | Attending: Hematology and Oncology

## 2022-06-21 ENCOUNTER — Other Ambulatory Visit: Payer: Self-pay | Admitting: Hematology and Oncology

## 2022-06-21 DIAGNOSIS — D631 Anemia in chronic kidney disease: Secondary | ICD-10-CM | POA: Insufficient documentation

## 2022-06-21 DIAGNOSIS — N1832 Chronic kidney disease, stage 3b: Secondary | ICD-10-CM | POA: Insufficient documentation

## 2022-06-21 LAB — CBC WITH DIFFERENTIAL (CANCER CENTER ONLY)
Abs Immature Granulocytes: 0.04 10*3/uL (ref 0.00–0.07)
Basophils Absolute: 0.1 10*3/uL (ref 0.0–0.1)
Basophils Relative: 1 %
Eosinophils Absolute: 0.3 10*3/uL (ref 0.0–0.5)
Eosinophils Relative: 2 %
HCT: 32.5 % — ABNORMAL LOW (ref 36.0–46.0)
Hemoglobin: 10 g/dL — ABNORMAL LOW (ref 12.0–15.0)
Immature Granulocytes: 0 %
Lymphocytes Relative: 21 %
Lymphs Abs: 2.5 10*3/uL (ref 0.7–4.0)
MCH: 27.5 pg (ref 26.0–34.0)
MCHC: 30.8 g/dL (ref 30.0–36.0)
MCV: 89.5 fL (ref 80.0–100.0)
Monocytes Absolute: 0.8 10*3/uL (ref 0.1–1.0)
Monocytes Relative: 6 %
Neutro Abs: 8.7 10*3/uL — ABNORMAL HIGH (ref 1.7–7.7)
Neutrophils Relative %: 70 %
Platelet Count: 279 10*3/uL (ref 150–400)
RBC: 3.63 MIL/uL — ABNORMAL LOW (ref 3.87–5.11)
RDW: 15.9 % — ABNORMAL HIGH (ref 11.5–15.5)
WBC Count: 12.4 10*3/uL — ABNORMAL HIGH (ref 4.0–10.5)
nRBC: 0 % (ref 0.0–0.2)

## 2022-06-25 ENCOUNTER — Ambulatory Visit: Payer: HMO

## 2022-06-26 ENCOUNTER — Other Ambulatory Visit: Payer: Self-pay | Admitting: Family Medicine

## 2022-06-28 ENCOUNTER — Inpatient Hospital Stay: Payer: HMO

## 2022-06-28 VITALS — BP 126/43 | HR 82 | Temp 98.1°F | Resp 20 | Ht 64.0 in | Wt 236.2 lb

## 2022-06-28 DIAGNOSIS — D631 Anemia in chronic kidney disease: Secondary | ICD-10-CM

## 2022-06-28 DIAGNOSIS — N1832 Chronic kidney disease, stage 3b: Secondary | ICD-10-CM | POA: Diagnosis not present

## 2022-06-28 MED ORDER — EPOETIN ALFA-EPBX 20000 UNIT/ML IJ SOLN
20000.0000 [IU] | Freq: Once | INTRAMUSCULAR | Status: AC
  Administered 2022-06-28: 20000 [IU] via SUBCUTANEOUS
  Filled 2022-06-28: qty 1

## 2022-06-28 NOTE — Patient Instructions (Signed)

## 2022-07-05 ENCOUNTER — Telehealth: Payer: Self-pay

## 2022-07-05 ENCOUNTER — Other Ambulatory Visit: Payer: Self-pay

## 2022-07-05 MED ORDER — ISOSORBIDE MONONITRATE ER 30 MG PO TB24: 30.0000 mg | ORAL_TABLET | Freq: Every day | ORAL | 1 refills | Status: DC

## 2022-07-05 MED ORDER — POTASSIUM CHLORIDE ER 20 MEQ PO TBCR: 1.0000 | EXTENDED_RELEASE_TABLET | Freq: Every day | ORAL | 1 refills | Status: DC

## 2022-07-05 MED ORDER — CLOPIDOGREL BISULFATE 75 MG PO TABS: 75.0000 mg | ORAL_TABLET | Freq: Every day | ORAL | 1 refills | Status: DC

## 2022-07-05 MED ORDER — ALLOPURINOL 300 MG PO TABS: 300.0000 mg | ORAL_TABLET | Freq: Every day | ORAL | 1 refills | Status: DC

## 2022-07-05 MED ORDER — PANTOPRAZOLE SODIUM 40 MG PO TBEC: 40.0000 mg | DELAYED_RELEASE_TABLET | Freq: Every day | ORAL | 1 refills | Status: DC

## 2022-07-05 NOTE — Chronic Care Management (AMB) (Signed)
Chronic Care Management Pharmacy Assistant   Name: Dana [name]  MRN: [id number] DOB: [birth year]  Reason for Encounter: Medication Review/ Medication coordination  Recent office visits:  None  Recent consult visits:  06-28-2022 Emmit Alexanders, RN (Oncology). Epoetin Alfa injection given.  06-14-2022 Quentin Mulling, RN (Oncology). Epoetin Alfa injection given.  05-31-2022 Maxwell Marion, RN (Oncology). Epoetin Alfa injection given.  Hospital visits:  None in previous 6 months  Medications: Outpatient Encounter Medications as of 07/05/2022  Medication Sig   allopurinol (ZYLOPRIM) 300 MG tablet Take 1 tablet (300 mg total) by mouth daily.   busPIRone (BUSPAR) 5 MG tablet Take 1 tablet (5 mg total) by mouth 3 (three) times daily.   carvedilol (COREG) 12.5 MG tablet TAKE ONE TABLET BY MOUTH TWICE DAILY   clopidogrel (PLAVIX) 75 MG tablet Take 1 tablet by mouth daily.   Continuous Blood Gluc Sensor (FREESTYLE LIBRE 2 SENSOR) MISC USE TO check blood glucose AS DIRECTED AND CHANGE sensor every 14 DAYS   EPINEPHrine 0.3 mg/0.3 mL IJ SOAJ injection Use as directed for life-threatening allergic reaction.   Evolocumab (REPATHA SURECLICK) 220 MG/ML SOAJ Inject 1 Dose into the skin every 14 (fourteen) days.   ezetimibe (ZETIA) 10 MG tablet Take 1 tablet (10 mg total) by mouth daily.   hydrALAZINE (APRESOLINE) 25 MG tablet Take 1 tablet (25 mg total) by mouth 3 (three) times daily.   HYDROcodone-acetaminophen (NORCO) 7.5-325 MG tablet TAKE ONE TABLET BY MOUTH THREE TIMES DAILY AS NEEDED FOR moderate pain   insulin aspart (NOVOLOG FLEXPEN) 100 UNIT/ML FlexPen 2-10 U before meals based on SSI.   Insulin Pen Needle (BD PEN NEEDLE NANO 2ND GEN) 32G X 4 MM MISC Inject 1 each into the skin in the morning, at noon, in the evening, and at bedtime.   isosorbide mononitrate (IMDUR) 30 MG 24 hr tablet Take 1 tablet by mouth daily.   lactulose (CHRONULAC) 10 GM/15ML solution Take 15 mLs (10 g total)  by mouth daily as needed for mild constipation.   latanoprost (XALATAN) 0.005 % ophthalmic solution Place 1 drop into both eyes at bedtime.   LEVEMIR FLEXPEN 100 UNIT/ML FlexPen INJECT 50 units into THE SKIN DAILY   LORazepam (ATIVAN) 0.5 MG tablet TAKE ONE TABLET BY MOUTH ONCE daily AS NEEDED FOR ANXIETY   magnesium oxide (MAG-OX) 400 MG tablet Take 2 tablets (800 mg total) by mouth daily. Take 2 (400 mg) daily=800 mg   nitroGLYCERIN (NITROSTAT) 0.4 MG SL tablet Place 1 tablet (0.4 mg total) under the tongue every 5 (five) minutes as needed for chest pain.   Omega-3 Fatty Acids (FISH OIL PO) Take 1 capsule by mouth daily.   OXYGEN Inhale 2 L into the lungs at bedtime.   pantoprazole (PROTONIX) 40 MG tablet Take 1 tablet (40 mg total) by mouth daily.   Potassium Chloride ER 20 MEQ TBCR Take 1 tablet by mouth daily.   Probiotic CAPS Take 1 capsule by mouth daily.   promethazine (PHENERGAN) 25 MG tablet TAKE ONE TABLET BY MOUTH daily AS NEEDED   torsemide (DEMADEX) 20 MG tablet TAKE 4 TABLETS BY MOUTH ONCE DAILY   No facility-administered encounter medications on file as of 07/05/2022.  Reviewed chart for medication changes ahead of medication coordination call.  No OVs, hospital visits since last care coordination call/Pharmacist visit.   No medication changes indicated   BP Readings from Last 3 Encounters:  06/28/22 (!) 126/43  06/14/22 (!) 113/58  05/31/22 117/68  Lab Results  Component Value Date   HGBA1C 8.2 (H) 01/19/2022     Patient obtains medications through Vials  90 Days   Last adherence delivery included:  Allopurinol '300mg'$  1 tab daily Novolog Flexpen Inject 2-10 units before meals Buspirone '5mg'$ - 1 tab three times daily Carvedilol 12.'5mg'$ - 1 tab twice daily  Clopidogrel '75mg'$ - 1 tab at bedtime Ezetimibe '10mg'$ - 1 tab once daily Hydralazine '25mg'$ - 1 tab three times daily  Isosorbide Mono '30mg'$ - 1 tab once daily  Pantoprazole '40mg'$ - 1 tab once daily  Potassium Chloride  Er 30mq- 1 tab once daily  Torsemide '20mg'$ - 4 tabs once daily  Freestyle Libre 2 Sensors- Change Sensor every 14 days.  Repatha '140mg'$ - Inject '140mg'$  every 14 days Levemir Flexpen Inj 100u-inject 50 units daily   Promethazine '25mg'$ -1 tab daily as needed      Patient declined (meds) last delivery: BD Pen needles- Pt has plenty, does not need Lactulose 10gm- Does not need  Lorazepam 0.'5mg'$ -filled on 03/30/22 30ds. Will fill when due  Hydrocodone-APAP 7.5-'325mg'$ -filled on 03/30/22 30ds.Will fill when due   Patient is due for next adherence delivery on: 07-19-2021  Called patient and reviewed medications and coordinated delivery.  This delivery to include: Allopurinol '300mg'$  1 tab daily Novolog Flexpen Inject 2-10 units before meals Buspirone '5mg'$ - 1 tab three times daily Carvedilol 12.'5mg'$ - 1 tab twice daily  Clopidogrel '75mg'$ - 1 tab at bedtime Ezetimibe '10mg'$ - 1 tab once daily Hydralazine '25mg'$ - 1 tab three times daily  Isosorbide Mono '30mg'$ - 1 tab once daily  Pantoprazole '40mg'$ - 1 tab once daily  Potassium Chloride Er 234m- 1 tab once daily  Torsemide '20mg'$ - 4 tabs once daily  Repatha '140mg'$ - Inject '140mg'$  every 14 days Levemir Flexpen Inj 100u-inject 50 units daily   Nitrostat 0.4 mg- Place 1 tablet (0.4 mg total) under the tongue every 5 (five) minutes as needed for chest pain.- Add to delivery. Requested upstream to request more refills from cardiology.  No acute/short fills needed  Patient declined the following medications: Promethazine- due 08-01-2022 Freestyle liElenor LegatoDue 09-14-2022  Patient needs refills for: Sent to PCP Allopurinol Clopidogrel  Isosorbide Mono  Pantoprazole  Potassium Chloride   Confirmed delivery date of 07-19-2021, advised patient that pharmacy will contact them the morning of delivery.   Care Gaps: Uacr overdue Tdap overdue Shingrix overdue Covid booster overdue  Star Rating Drugs: None  [facility]annette [facility]linical Pharmacist  Assistant [phone]

## 2022-07-06 ENCOUNTER — Other Ambulatory Visit: Payer: Self-pay | Admitting: Family Medicine

## 2022-07-10 ENCOUNTER — Ambulatory Visit: Payer: HMO

## 2022-07-11 ENCOUNTER — Other Ambulatory Visit (HOSPITAL_COMMUNITY): Payer: PPO

## 2022-07-11 ENCOUNTER — Ambulatory Visit (INDEPENDENT_AMBULATORY_CARE_PROVIDER_SITE_OTHER): Payer: HMO

## 2022-07-11 ENCOUNTER — Ambulatory Visit: Payer: PPO

## 2022-07-11 DIAGNOSIS — N2581 Secondary hyperparathyroidism of renal origin: Secondary | ICD-10-CM

## 2022-07-11 DIAGNOSIS — I130 Hypertensive heart and chronic kidney disease with heart failure and stage 1 through stage 4 chronic kidney disease, or unspecified chronic kidney disease: Secondary | ICD-10-CM

## 2022-07-11 DIAGNOSIS — E782 Mixed hyperlipidemia: Secondary | ICD-10-CM

## 2022-07-11 NOTE — Patient Instructions (Signed)
Visit Information   Goals Addressed   None    Patient Care Plan: CCM Pharmacy Care Plan     Problem Identified: chf, hld, dm   Priority: High  Onset Date: 11/17/2020     Long-Range Goal: Disease State Management   Start Date: 11/17/2020  Expected End Date: 11/17/2021  Recent Progress: On track  Priority: High  Note:     Current Barriers:  Unable to maintain control of diabetes  Pharmacist Clinical Goal(s):  Patient will maintain control of diabetes as evidenced by a1c and avoiding low blood sugar readings  through collaboration with PharmD and provider.   Interventions: 1:1 collaboration with [name], MD regarding development and update of comprehensive plan of care as evidenced by provider attestation and co-signature Inter-disciplinary care team collaboration (see longitudinal plan of care) Comprehensive medication review performed; medication list updated in electronic medical record  CAD: (LDL goal < 55) -Managed Daniel Bensimhon The ASCVD Risk score (Arnett DK, et al., 2019) failed to calculate for the following reasons:   The patient has a prior MI or stroke diagnosis Lab Results  Component Value Date   CHOL 87 (L) 04/25/2022   CHOL 83 (L) 01/19/2022   CHOL 91 (L) 01/04/2022   Lab Results  Component Value Date   HDL 54 04/25/2022   HDL 46 01/19/2022   HDL 51 01/04/2022   Lab Results  Component Value Date   LDLCALC 15 04/25/2022   LDLCALC 21 01/19/2022   LDLCALC 24 01/04/2022   Lab Results  Component Value Date   TRIG 91 04/25/2022   TRIG 72 01/19/2022   TRIG 77 01/04/2022   Lab Results  Component Value Date   CHOLHDL 1.6 04/25/2022   CHOLHDL 1.8 01/19/2022   CHOLHDL 1.8 01/04/2022  No results found for: "LDLDIRECT" -Not ideally controlled -Current treatment: Repatha sureclick every 2 weeks ([employer] approved 07/27/21) Appropriate, Effective, Safe, Accessible Ezetimibe 88m Appropriate, Effective, Safe, Accessible Clopidogrel 731m (Dual Therapy 12 months post DES on 02/03/21) Appropriate, Effective, Safe, Accessible ASA 8153mDual Therapy 12 months post DES on 02/03/21) Appropriate, Effective, Safe, Accessible -Medications previously tried: pravastatin, ASA 65m68murrent dietary patterns: working on healthy diet with dietician through insurance -Current exercise habits: cardiac rehab. [facility]  -Educated on Cholesterol goals;  Importance of limiting foods high in cholesterol; Exercise goal of 150 minutes per week; -Counseled on diet and exercise extensively June 2023: Called patient back, asked if she was still taking ASA since removed from [facility] stated Cardio stopped ASA in March but she forgot they stopped it and has been taking ever since. She stated she'll stop immediately December 2023: Will re-new [employer] grant for Jan  Diabetes (A1c goal <7%) Lab Results  Component Value Date   HGBA1C 8.2 (H) 01/19/2022   HGBA1C 6.6 (H) 04/25/2021   HGBA1C 6.8 (H) 09/30/2020   Lab Results  Component Value Date   LDLCALC 15 04/25/2022   CREATININE 2.15 (H) 05/01/2022   Lab Results  Component Value Date   NA 140 05/01/2022   K 4.1 05/01/2022   CREATININE 2.15 (H) 05/01/2022   EGFR 26 (L) 05/01/2022   GFRNONAA 26 (L) 02/02/2022   GLUCOSE 115 (H) 05/01/2022   Lab Results  Component Value Date   WBC 12.4 (H) 06/21/2022   HGB 10.0 (L) 06/21/2022   HCT 32.5 (L) 06/21/2022   MCV 89.5 06/21/2022   PLT 279 06/21/2022  -Controlled -Current medications:  Levemir 50 units daily Appropriate, Effective, Safe, Accessible Novolog 2-10 units tid before meals Appropriate, Effective, Safe, Accessible -Medications previously tried:  farxiga -Current home glucose readings fasting glucose:  May 2022: 99-105  November 2022: 05/25/21: 24 December 2021: 12/21/21: 92 fasting, 118 after eating December 2023: 07/11/22: 120 07/10/22: 129, 282 (After food), 96 7  Day Average: 129 30 Days: 140 -Reports hypoglycemic/hyperglycemic symptoms - 2 times in the past few weeks when she got busy and didn't eat as quickly -Current meal patterns:  breakfast:  Watching diet and improving choices for heart healthy, lower sodium and lower blood sugar lunch:  not overly hungry at lunch time but does try to eat  dinner: avoiding fried foods, more fish (2-3 times weekly), baked chicken, utilizing portion control, not eating pizza since heart attack snacks: 1/2 peanut butter sandwich, has cut back  drinks:  has cut out diet coke -Current exercise: cardiac rehab -Educated on A1c and blood sugar goals; Exercise goal of 150 minutes per week; Prevention and management of hypoglycemic episodes; Carbohydrate counting and/or plate method -Counseled to check feet daily and get yearly eye exams -Counseled on diet and exercise extensively June 2023: Counseled extensively on anemia and how it could mess with sugar readings. Recommended she bring in sugar logs to all appts  Heart Failure (Goal: manage symptoms and prevent exacerbations) -Managed Dr. [name] Chronic Diastolic HF - Echo 09/1515 EF 55-60%  - Echo (3/22): EF 55-60%, Grade II DD, normal RV - Echo (6/22): EF 60%, mod MR/TR (per chart review at [facility]). - RHC (10/22): mild to moderate elevated filling pressures, large v-waves PCWP tracing - TEE (10/22): EF 55-60%, basal inferior akinesis, severe central MR. - Recent admit 10/22 with a/c CHF, enrolled in FASTR trial and diuresed 15 lbs. Severe MR likely contributing to decompensation.  - Echo (11/22): EF 50-55%, severe LV HK, moderate MR - Stable NYHA III. Has been doing well with Cardiac Rehab. BP Readings from Last 3 Encounters:  06/28/22 (!) 126/43  06/14/22 (!) 113/58  05/31/22 117/68  -Controlled -Current treatment: Torsemide 20 mg 2 tablets daily Appropriate, Effective, Safe, Accessible Carvedilol 12.5 mg bid Appropriate, Effective, Safe,  Accessible Hydralazine 25 mg tid Appropriate, Effective, Safe, Accessible Isosorbide mn 30 mg daily Appropriate, Effective, Safe, Accessible -Weight: (Weighs herself everyday) Normal: 232 + 2 pounds 05/25/21: 232 BP: (Tests daily) 05/25/21: 132/57 07/27/21: Didn't have reading today -Medications previously tried:  Metolazone, spironolactone,  -Current dietary habits:  watching sodium  -Current exercise habits:  cardiac rehab currently  -Educated on Benefits of medications for managing symptoms and prolonging life Importance of blood pressure control -Counseled on diet and exercise extensively Recommended to continue current medication. Wearing support stockings every day.    Patient Goals/Self-Care Activities Patient will:  - take medications as prescribed focus on medication adherence by using pill box  check glucose 3-4 times daily, document, and provide at future appointments target a minimum of 150 minutes of moderate intensity exercise weekly engage in dietary modifications by limiting carbohydrates, sodium and fatty foods  Follow Up Plan: Telephone follow up appointment with care management team member scheduled for: December 2024  [place] Constable, Sherian Rein.D. - [phone]      Ms. Narayan was given information about Chronic Care Management services today including:  CCM service includes personalized support from designated clinical staff supervised by her physician, including individualized plan of care and coordination with other care providers 24/7 contact phone numbers for assistance for urgent and routine care needs. Standard insurance, coinsurance,  copays and deductibles apply for chronic care management only during months in which we provide at least 20 minutes of these services. Most insurances cover these services at 100%, however patients may be responsible for any copay, coinsurance and/or deductible if applicable. This service may help you avoid the need for more  expensive face-to-face services. Only one practitioner may furnish and bill the service in a calendar month. The patient may stop CCM services at any time (effective at the end of the month) by phone call to the office staff.  Patient agreed to services and verbal consent obtained.   The patient verbalized understanding of instructions, educational materials, and care plan provided today and DECLINED offer to receive copy of patient instructions, educational materials, and care plan.  The pharmacy team will reach out to the patient again over the next 60 days.   Lane Hacker, [place]

## 2022-07-11 NOTE — Progress Notes (Signed)
Chronic Care Management Pharmacy Note  07/11/2022 Name:  Dana [name] MRN:  [id number] DOB:  1961/04/27   Plan Updates:  No updates, patient doing well  Subjective: Dana [name] is an 61 y.o. year old female who is a primary patient of Cox, Kirsten, MD.  The CCM team was consulted for assistance with disease management and care coordination needs.    Engaged with patient by telephone for follow up visit in response to provider referral for pharmacy case management and/or care coordination services.   Consent to Services:  The patient was given the following information about Chronic Care Management services today, agreed to services, and gave verbal consent: 1. CCM service includes personalized support from designated clinical staff supervised by the primary care provider, including individualized plan of care and coordination with other care providers 2. 24/7 contact phone numbers for assistance for urgent and routine care needs. 3. Service will only be billed when office clinical staff spend 20 minutes or more in a month to coordinate care. 4. Only one practitioner may furnish and bill the service in a calendar month. 5.The patient may stop CCM services at any time (effective at the end of the month) by phone call to the office staff. 6. The patient will be responsible for cost sharing (co-pay) of up to 20% of the service fee (after annual deductible is met). Patient agreed to services and consent obtained.  Patient Care Team: [name], MD as PCP - General (Internal Medicine) Bensimhon, Shaune Pascal, MD as PCP - Cardiology (Cardiology) Kennith Gain, MD as Consulting Physician (Allergy) Bensimhon, Shaune Pascal, MD as Consulting Physician (Cardiology) Scherrie November, MD as Referring Physician (Gastroenterology) Alanda Slim, Neena Rhymes, MD as Consulting Physician (Ophthalmology) Katheran James., MD as Consulting Physician (Endocrinology) Sharyn Dross., DPM as Consulting  Physician (Podiatry) Hennie Duos, MD as Consulting Physician (Rheumatology) Biagio Quint, MD as Referring Physician (Nephrology) Lane Hacker, [facility] as Pharmacist (Pharmacist) Lane Hacker, [facility] (Pharmacist) Early Osmond, MD as Consulting Physician (Cardiology) Derwood Kaplan, MD as Consulting Physician (Oncology) Charlaine Dalton, MD as Referring Physician Katheran James., MD (Endocrinology)  Recent office visits:  None   Recent consult visits:  06-28-2022 Emmit Alexanders, RN (Oncology). Epoetin Alfa injection given.   06-14-2022 Quentin Mulling, RN (Oncology). Epoetin Alfa injection given.   05-31-2022 Maxwell Marion, RN (Oncology). Epoetin Alfa injection given.   Hospital visits:  None in previous 6 months  Objective:  Lab Results  Component Value Date   CREATININE 2.15 (H) 05/01/2022   BUN 61 (H) 05/01/2022   GFRNONAA 26 (L) 02/02/2022   GFRAA 43 (L) 06/28/2020   NA 140 05/01/2022   K 4.1 05/01/2022   CALCIUM 9.1 05/01/2022   CO2 24 05/01/2022   GLUCOSE 115 (H) 05/01/2022    Lab Results  Component Value Date/Time   HGBA1C 8.2 (H) 01/19/2022 08:05 AM   HGBA1C 6.6 (H) 04/25/2021 12:20 AM    Last diabetic Eye exam:  Lab Results  Component Value Date/Time   HMDIABEYEEXA Retinopathy (A) 11/13/2021 12:00 AM    Last diabetic Foot exam: No results found for: "HMDIABFOOTEX"   Lab Results  Component Value Date   CHOL 87 (L) 04/25/2022   HDL 54 04/25/2022   LDLCALC 15 04/25/2022   TRIG 91 04/25/2022   CHOLHDL 1.6 04/25/2022       Latest Ref Rng & Units 05/01/2022    3:16 PM 04/25/2022  8:13 AM 03/29/2022   12:00 AM  Hepatic Function  Total Protein 6.0 - 8.5 g/dL  7.5    Albumin 3.8 - 4.9 g/dL 3.7  3.9  3.9  C     AST 0 - 40 IU/L  16  29  C     ALT 0 - 32 IU/L  16  21  C     Alk Phosphatase 44 - 121 IU/L  177  154  C     Total Bilirubin 0.0 - 1.2 mg/dL  0.4      C Corrected result   This result is from an external  source.    Lab Results  Component Value Date/Time   TSH 2.900 01/19/2022 08:05 AM   TSH 4.014 04/24/2021 12:50 PM   TSH 3.590 03/23/2020 11:01 AM       Latest Ref Rng & Units 06/21/2022   10:41 AM 05/24/2022   10:40 AM 04/26/2022   12:00 AM  CBC  WBC 4.0 - 10.5 K/uL 12.4  11.9  11.4      Hemoglobin 12.0 - 15.0 g/dL 10.0  9.8  10.3      Hematocrit 36.0 - 46.0 % 32.5  31.5  32      Platelets 150 - 400 K/uL 279  289  257         This result is from an external source.    Lab Results  Component Value Date/Time   VD25OH 25.5 (L) 04/25/2022 08:13 AM   VD25OH 26.0 (L) 02/28/2021 02:09 PM    Clinical ASCVD: Yes  The ASCVD Risk score (Arnett DK, et al., 2019) failed to calculate for the following reasons:   The patient has a prior MI or stroke diagnosis       04/25/2022    7:58 AM 03/07/2022   11:18 AM 12/21/2021   10:29 AM  Depression screen PHQ 2/9  Decreased Interest 0 1 1  Down, Depressed, Hopeless _0 PHQ - 2 Score _1 Altered sleeping 3 0 0  Tired, decreased energy _2 Change in appetite 0 0 0  Feeling bad or failure about yourself  0 3 3  Trouble concentrating 0 0 0  Moving slowly or fidgety/restless 0 0 1  Suicidal thoughts 0 0 0  PHQ-9 Score _3 Difficult doing work/chores Somewhat difficult Not difficult at all       Social History   Tobacco Use  Smoking Status Former   Types: Cigarettes   Start date: 1983   Quit date: 1984   Years since quitting: 40.0  Smokeless Tobacco Never   BP Readings from Last 3 Encounters:  06/28/22 (!) 126/43  06/14/22 (!) 113/58  05/31/22 117/68   Pulse Readings from Last 3 Encounters:  06/28/22 82  06/14/22 64  05/31/22 68   Wt Readings from Last 3 Encounters:  06/28/22 236 lb 4 oz (107.2 kg)  06/14/22 236 lb (107 kg)  05/31/22 236 lb (107 kg)   BMI Readings from Last 3 Encounters:  06/28/22 40.55 kg/m  06/14/22 40.51 kg/m  05/31/22 40.51 kg/m    Assessment/Interventions: Review of patient  past medical history, allergies, medications, health status, including review of consultants reports, laboratory and other test data, was performed as part of comprehensive evaluation and provision of chronic care management services.   SDOH:  (Social Determinants of Health) assessments and interventions performed: Yes SDOH Interventions    Flowsheet Row Chronic Care Management from  07/11/2022 in [place] Visit from 04/25/2022 in [place] from 03/07/2022 in [place] Management from 12/21/2021 in [place] Visit from 10/10/2021 in [place] Admission (Discharged) from 04/24/2021 in [place] Interventions        Food Insecurity Interventions -- -- -- -- -- Intervention Not Indicated  Housing Interventions -- -- -- -- -- Intervention Not Indicated  Transportation Interventions Intervention Not Indicated -- -- Intervention Not Indicated -- Intervention Not Indicated  Depression Interventions/Treatment  -- Currently on Treatment Currently on Treatment -- Currently on Treatment --  Financial Strain Interventions Intervention Not Indicated -- -- Intervention Not Indicated -- Intervention Not Indicated      SDOH Screenings   Food Insecurity: No Food Insecurity (04/27/2021)  Housing: Low Risk  (04/27/2021)  Transportation Needs: No Transportation Needs (07/11/2022)  Alcohol Screen: Low Risk  (05/09/2021)  Depression (PHQ2-9): Medium Risk (04/25/2022)  Financial Resource Strain: Low Risk  (07/11/2022)  Physical Activity: Insufficiently Active (02/15/2020)  Tobacco Use: Medium Risk (06/14/2022)    CCM Care Plan  Allergies  Allergen Reactions   Naproxen Hives and Rash   Celecoxib Other (See Comments)    Stomach pain   Aspirin Other (See Comments)    CAN NOT TAKE DUE TO ULCERS (documented previously) but takes every day currently (as of 09/29/20) without reported issues Can  take coated aspirin   Glucosamine Hives, Swelling and Other (See Comments)    Angioedema    Iron     IV iron transfusion - hives - Feb 2022 hospitalization   Metoclopramide Other (See Comments)    Facial twitching and stuttering   Metolazone Other (See Comments)    Acute renal failure   Other Hives    Strawberry allergy   Shellfish-Derived Products Hives, Swelling and Other (See Comments)    Angioedema    Statins Other (See Comments)    Muscle pain   Strawberry (Diagnostic)    Strawberry Extract    Strawberry Flavor    Strawberry Leaves Extract    Tramadol Nausea And Vomiting         Medications Reviewed Today     Reviewed by Lane Hacker, [facility] (Pharmacist) on 07/11/22 at 1442  Med List Status: <None>   Medication Order Taking? Sig Documenting Provider Last Dose Status Informant  allopurinol (ZYLOPRIM) 300 MG tablet [id number]  Take 1 tablet (300 mg total) by mouth daily. Cox, Kirsten, MD  Active   busPIRone (BUSPAR) 5 MG tablet [id number] No Take 1 tablet (5 mg total) by mouth 3 (three) times daily. [name], MD Taking Active   carvedilol (COREG) 12.5 MG tablet [id number] No TAKE ONE TABLET BY MOUTH TWICE DAILY [place], Maricela Bo, FNP Taking Active   clopidogrel (PLAVIX) 75 MG tablet [id number]  Take 1 tablet (75 mg total) by mouth daily. Cox, Kirsten, MD  Active   Continuous Blood Gluc Sensor (FREESTYLE LIBRE 2 SENSOR) [place] [id number] No USE TO check blood glucose AS DIRECTED AND CHANGE sensor every 14 DAYS [provider] Taking Active   EPINEPHrine 0.3 mg/0.3 mL IJ SOAJ injection [id number] No Use as directed for life-threatening allergic reaction. Kennith Gain, MD Taking Active   Evolocumab [facility] SURECLICK) 811 MG/ML Darden Palmer [id number] No Inject 1 Dose into the skin every 14 (fourteen) days. Pixie Casino, MD Taking Active   ezetimibe (ZETIA) 10 MG tablet [id number] No Take 1 tablet (10 mg total) by  mouth daily. Early Osmond, MD Taking Active    hydrALAZINE (APRESOLINE) 25 MG tablet [id number] No Take 1 tablet (25 mg total) by mouth 3 (three) times daily. [place], [place], FNP Taking Active   HYDROcodone-acetaminophen (NORCO) 7.5-325 MG tablet [id number]  TAKE ONE TABLET BY MOUTH THREE TIMES DAILY AS NEEDED FOR moderate pain Cox, Kirsten, MD  Active   insulin aspart (NOVOLOG FLEXPEN) 100 UNIT/ML FlexPen [id number] No 2-10 U before meals based on SSI. Cox, Kirsten, MD Taking Active   Insulin Pen Needle (BD PEN NEEDLE NANO 2ND GEN) 32G X 4 MM MISC [id number] No Inject 1 each into the skin in the morning, at noon, in the evening, and at bedtime. Cox, Kirsten, MD Taking Active   isosorbide mononitrate (IMDUR) 30 MG 24 hr tablet [id number]  Take 1 tablet (30 mg total) by mouth daily. Cox, Kirsten, MD  Active   lactulose [facility]) 10 GM/15ML solution [id number] No Take 15 mLs (10 g total) by mouth daily as needed for mild constipation. Cox, Kirsten, MD Taking Active   latanoprost (XALATAN) 0.005 % ophthalmic solution [id number] No Place 1 drop into both eyes at bedtime. [provider] Taking Active   LEVEMIR FLEXPEN 100 UNIT/ML FlexPen [id number]  INJECT 50 units into THE SKIN DAILY Cox, Kirsten, MD  Active   LORazepam (ATIVAN) 0.5 MG tablet [id number] No TAKE ONE TABLET BY MOUTH ONCE daily AS NEEDED FOR ANXIETY Cox, Kirsten, MD Taking Active   magnesium oxide (MAG-OX) 400 MG tablet [id number] No Take 2 tablets (800 mg total) by mouth daily. Take 2 (400 mg) daily=800 mg Shirley Friar, PA-C Taking Active   nitroGLYCERIN (NITROSTAT) 0.4 MG SL tablet [id number] No Place 1 tablet (0.4 mg total) under the tongue every 5 (five) minutes as needed for chest pain. Eileen Stanford, PA-C Taking Active   Omega-3 Fatty Acids (FISH OIL PO) [id number] No Take 1 capsule by mouth daily. [provider] Taking Active   OXYGEN [id number] No Inhale 2 L into the lungs at bedtime. [provider] Taking Active   pantoprazole (PROTONIX) 40  MG tablet [id number]  Take 1 tablet (40 mg total) by mouth daily. [name], MD  Active   Potassium Chloride ER 20 MEQ TBCR [id number]  Take 1 tablet by mouth daily. Cox, Kirsten, MD  Active   potassium chloride SA (KLOR-CON M) 20 MEQ tablet [id number]  TAKE ONE TABLET BY MOUTH ONCE daily [name], MD  Active   Probiotic CAPS [id number] No Take 1 capsule by mouth daily. [provider] Taking Active   promethazine (PHENERGAN) 25 MG tablet [id number]  TAKE ONE TABLET BY MOUTH daily AS NEEDED Lillard Anes, MD  Active   torsemide (DEMADEX) 20 MG tablet [id number] No TAKE 4 TABLETS BY MOUTH ONCE DAILY Bensimhon, Shaune Pascal, MD Taking Active   Med List Note Burna Forts, [place] 04/25/22 8280): Levemir Flexpen CPAP            Patient Active Problem List   Diagnosis Date Noted   Leukocytosis 03/29/2022   Genetic testing 11/07/2021   Family history of breast cancer 10/23/2021   GAD (generalized anxiety disorder) 07/11/2021   Mixed hyperlipidemia 07/11/2021   S/P mitral valve clip implantation 05/18/2021   Anemia due to stage 3b chronic kidney disease ([place]) 04/04/2021   History of cardiac arrest 02/06/2021   Bilateral lower extremity edema 01/23/2021   Anoxic brain injury ([place]) 01/20/2021   Impaired mobility and ADLs 01/20/2021   Left-sided weakness 01/20/2021  Drug-induced myopathy 01/03/2021   Secondary hyperparathyroidism of renal origin ([place]) 11/18/2020   Vitamin D insufficiency 11/18/2020   Contrast dye induced nephropathy, possible 09/03/2020   OSA on CPAP 09/03/2020   Need for COVID-19 vaccine 06/28/2020   Medicare annual wellness visit, subsequent 06/13/2020   Chronic respiratory failure with hypoxia ([place]) 03/23/2020   Chronic gout without tophus 03/23/2020   Pain in both hands 93/71/6967   Uncomplicated opioid dependence ([place]) 03/23/2020   Depression, major, recurrent, mild ([place]) 12/21/2019   Hypomagnesemia 12/21/2019   Diarrhea 12/21/2019   Lumbar back  pain 10/05/2019   Diabetic nephropathy associated with type 2 diabetes mellitus ([place]) 10/05/2019   Hypertensive heart and renal disease with congestive heart failure ([place]) 09/18/2019   Demand ischemia    Diastolic heart failure ([place]) 11/07/2017   Class 2 severe obesity due to excess calories with serious comorbidity and body mass index (BMI) of 39.0 to 39.9 in adult ([place]) 11/07/2017   Coronary artery disease of native artery of native heart with stable angina pectoris ([place]) 12/25/2016   Hyperlipidemia LDL goal <70 12/14/2016   Anemia 03/23/2016   Chronic pain syndrome 03/23/2016   GERD without esophagitis 03/23/2016   On home oxygen therapy 03/23/2016   S/P ablation of atrial fibrillation 10/04/2015   Diabetic polyneuropathy associated with type 2 diabetes mellitus ([place]) 04/02/2014    Immunization History  Administered Date(s) Administered   Influenza Inj Mdck Quad Pf 03/23/2020, 04/04/2021   Influenza, Quadrivalent, Recombinant, Inj, Pf 05/21/2018   Influenza,inj,Quad PF,6+ Mos 03/28/2015, 03/07/2022   Influenza-Unspecified 04/15/2014, 05/16/2016, 03/12/2019   Moderna Covid-19 Vaccine Bivalent Booster 25yr & up 05/09/2021   Moderna SARS-COV2 Booster Vaccination 06/28/2020   Moderna Sars-Covid-2 Vaccination 09/30/2019, 10/28/2019   Pneumococcal Conjugate-13 05/16/2014, 06/14/2014   Pneumococcal Polysaccharide-23 05/18/2013    Conditions to be addressed/monitored:  Hyperlipidemia, Diabetes, Heart Failure and Coronary Artery Disease  Care Plan : [place] Updates made by KLane Hacker RPH since 07/11/2022 12:00 AM     Problem: chf, hld, dm   Priority: High  Onset Date: 11/17/2020     Long-Range Goal: Disease State Management   Start Date: 11/17/2020  Expected End Date: 11/17/2021  Recent Progress: On track  Priority: High  Note:     Current Barriers:  Unable to maintain control of diabetes  Pharmacist Clinical Goal(s):  Patient will maintain control of  diabetes as evidenced by a1c and avoiding low blood sugar readings  through collaboration with PharmD and provider.   Interventions: 1:1 collaboration with CRochel Brome MD regarding development and update of comprehensive plan of care as evidenced by provider attestation and co-signature Inter-disciplinary care team collaboration (see longitudinal plan of care) Comprehensive medication review performed; medication list updated in electronic medical record  CAD: (LDL goal < 55) -Managed Daniel Bensimhon The ASCVD Risk score (Arnett DK, et al., 2019) failed to calculate for the following reasons:   The patient has a prior MI or stroke diagnosis Lab Results  Component Value Date   CHOL 87 (L) 04/25/2022   CHOL 83 (L) 01/19/2022   CHOL 91 (L) 01/04/2022   Lab Results  Component Value Date   HDL 54 04/25/2022   HDL 46 01/19/2022   HDL 51 01/04/2022   Lab Results  Component Value Date   LDLCALC 15 04/25/2022   LDLCALC 21 01/19/2022   [place]24 01/04/2022   Lab Results  Component Value Date   TRIG 91 04/25/2022   TRIG 72 01/19/2022   TRIG  77 01/04/2022   Lab Results  Component Value Date   CHOLHDL 1.6 04/25/2022   CHOLHDL 1.8 01/19/2022   CHOLHDL 1.8 01/04/2022  No results found for: "LDLDIRECT" -Not ideally controlled -Current treatment: Repatha sureclick every 2 weeks ([place] approved 07/27/21) Appropriate, Effective, Safe, Accessible Ezetimibe 10m Appropriate, Effective, Safe, Accessible Clopidogrel 776m(Dual Therapy 12 months post DES on 02/03/21) Appropriate, Effective, Safe, Accessible ASA 8191mDual Therapy 12 months post DES on 02/03/21) Appropriate, Effective, Safe, Accessible -Medications previously tried: pravastatin, ASA 64m38murrent dietary patterns: working on healthy diet with dietician through insurance -Current exercise habits: cardiac rehab. [facility].  -Educated on Cholesterol  goals;  Importance of limiting foods high in cholesterol; Exercise goal of 150 minutes per week; -Counseled on diet and exercise extensively June 2023: Called patient back, asked if she was still taking [place]SA [place] stated Cardio stopped ASA in March but she forgot they stopped it and has been taking ever since. She stated she'll stop immediately December 2023: Will re-new healthwell grant for Jan  Diabetes (A1c goal <7%) Lab Results  Component Value Date   HGBA1C 8.2 (H) 01/19/2022   HGBA1C 6.6 (H) 04/25/2021   HGBA1C 6.8 (H) 09/30/2020   Lab Results  Component Value Date   LDLCALC 15 04/25/2022   CREATININE 2.15 (H) 05/01/2022   Lab Results  Component Value Date   NA 140 05/01/2022   K 4.1 05/01/2022   CREATININE 2.15 (H) 05/01/2022   EGFR 26 (L) 05/01/2022   GFRNONAA 26 (L) 02/02/2022   GLUCOSE 115 (H) 05/01/2022   Lab Results  Component Value Date   WBC 12.4 (H) 06/21/2022   HGB 10.0 (L) 06/21/2022   HCT 32.5 (L) 06/21/2022   MCV 89.5 06/21/2022   PLT 279 06/21/2022  -Controlled -Current medications: Levemir 50 units daily Appropriate, Effective, Safe, Accessible Novolog 2-10 units tid before meals Appropriate, Effective, Safe, Accessible -Medications previously tried:  farxiga -Current home glucose readings fasting glucose:  May 2022: 99-105  November 2022: 05/25/21: 24 December 2021: 12/21/21: 92 fasting, 118 after eating December 2023: 07/11/22: 120 07/10/22: 129, 282 (After food), 96 7 Day Average: 129 30 Days: 140 -Reports hypoglycemic/hyperglycemic symptoms - 2 times in the past few weeks when she got busy and didn't eat as quickly -Current meal patterns:  breakfast:  Watching diet and improving choices for heart healthy, lower sodium and lower blood sugar lunch:  not overly hungry at lunch time but does try to eat  dinner: avoiding fried foods, more fish (2-3 times weekly), baked chicken, utilizing portion control, not eating pizza  since heart attack snacks: 1/2 peanut butter sandwich, has cut back  drinks:  has cut out diet coke -Current exercise: cardiac rehab -Educated on A1c and blood sugar goals; Exercise goal of 150 minutes per week; Prevention and management of hypoglycemic episodes; Carbohydrate counting and/or plate method -Counseled to check feet daily and get yearly eye exams -Counseled on diet and exercise extensively June 2023: Counseled extensively on anemia and how it could mess with sugar readings. Recommended she bring in sugar logs to all appts  Heart Failure (Goal: manage symptoms and prevent exacerbations) -Managed Dr. BensHaroldine Lawsonic Diastolic HF - Echo 2/207/001755-60%  - Echo (3/22): EF 55-60%, Grade II DD, normal RV - Echo (6/22): EF 60%, mod MR/TR ([facility] (10[facility]): mild to moderate elevated filling pressures, large v-waves PCWP tracing -  TEE (10/22): EF 55-60%, basal inferior akinesis, severe central MR. - Recent admit 10/22 with a/c CHF, enrolled in FASTR trial and diuresed 15 lbs. Severe MR likely contributing to decompensation.  - Echo (11/22): EF 50-55%, severe LV HK, moderate MR - Stable NYHA III. Has been doing well with Cardiac Rehab. BP Readings from Last 3 Encounters:  06/28/22 (!) 126/43  06/14/22 (!) 113/58  05/31/22 117/68  -Controlled -Current treatment: Torsemide 20 mg 2 tablets daily Appropriate, Effective, Safe, Accessible Carvedilol 12.5 mg bid Appropriate, Effective, Safe, Accessible Hydralazine 25 mg tid Appropriate, Effective, Safe, Accessible Isosorbide mn 30 mg daily Appropriate, Effective, Safe, Accessible -Weight: (Weighs herself everyday) Normal: 232 + 2 pounds 05/25/21: 232 BP: (Tests daily) 05/25/21: 132/57 07/27/21: Didn't have reading today -Medications previously tried:  Metolazone, spironolactone,  -Current dietary habits:  watching sodium  -Current exercise habits:  cardiac rehab currently  -Educated on Benefits of  medications for managing symptoms and prolonging life Importance of blood pressure control -Counseled on diet and exercise extensively Recommended to continue current medication. Wearing support stockings every day.    Patient Goals/Self-Care Activities Patient will:  - take medications as prescribed focus on medication adherence by using pill box  check glucose 3-4 times daily, document, and provide at future appointments target a minimum of 150 minutes of moderate intensity exercise weekly engage in dietary modifications by limiting carbohydrates, sodium and fatty foods  Follow Up Plan: Telephone follow up appointment with care management team member scheduled for: December 2024  [place] Constable, Sherian Rein.D. - [phone]       Medication Assistance: Patient has grant to cover cost of [place].   Patient's preferred pharmacy is:  [facility] - [place], [employer] [facility] [address] [place] [address] [place] [address] Phone: [phone] Fax: [phone]  [facility] - [place], [place] - [address] [place] [address] Phone: [phone] Fax: [phone]  [facility] [address] [place] - [place] [address] [place] [address] Phone: [phone] Fax: [phone]   Uses pill box? Yes Pt endorses good compliance  We discussed: Current pharmacy is preferred with insurance plan and patient is satisfied with pharmacy services Patient decided to: Utilize [facility] for medication synchronization, packaging and delivery Verbal consent obtained for [facility] enhanced pharmacy services (medication synchronization, adherence packaging, delivery coordination). A medication sync plan was created to allow patient to get all medications delivered once every 30 to 90 days per patient preference. Patient understands they have freedom to  choose pharmacy and clinical pharmacist will coordinate care between all prescribers and [facility].    Care Plan and Follow Up Patient Decision:  Patient agrees to Care Plan and Follow-up.  Plan: Telephone follow up appointment with care management team member scheduled for:  December 2024    [place] Constable, [place].D. - [phone]

## 2022-07-12 ENCOUNTER — Inpatient Hospital Stay: Payer: HMO

## 2022-07-12 ENCOUNTER — Telehealth: Payer: Self-pay

## 2022-07-12 VITALS — BP 141/57 | HR 74 | Temp 97.5°F | Resp 20 | Ht 64.0 in | Wt 234.0 lb

## 2022-07-12 DIAGNOSIS — D631 Anemia in chronic kidney disease: Secondary | ICD-10-CM

## 2022-07-12 DIAGNOSIS — N1832 Chronic kidney disease, stage 3b: Secondary | ICD-10-CM | POA: Diagnosis not present

## 2022-07-12 MED ORDER — EPOETIN ALFA-EPBX 20000 UNIT/ML IJ SOLN
20000.0000 [IU] | Freq: Once | INTRAMUSCULAR | Status: AC
  Administered 2022-07-12: 20000 [IU] via SUBCUTANEOUS
  Filled 2022-07-12: qty 1

## 2022-07-12 NOTE — Patient Instructions (Signed)

## 2022-07-12 NOTE — Telephone Encounter (Signed)
Spoke with the patient and confirmed 1 year Clip echo/office visit next week. Unfortunately, she does not feel well and reports pretty much constant fatigue.  She reports she felt better immediately after the procedure and progressively has gotten more symptomatic over the last several months.  Reiterated to her to keep appointments for next week as the echo will show if anything isn't right with her valve. She was grateful for call and agreed with plan.  KCCQ complete.

## 2022-07-13 ENCOUNTER — Other Ambulatory Visit: Payer: Self-pay | Admitting: Physician Assistant

## 2022-07-15 DIAGNOSIS — Z794 Long term (current) use of insulin: Secondary | ICD-10-CM | POA: Diagnosis not present

## 2022-07-15 DIAGNOSIS — E1159 Type 2 diabetes mellitus with other circulatory complications: Secondary | ICD-10-CM

## 2022-07-15 DIAGNOSIS — I2510 Atherosclerotic heart disease of native coronary artery without angina pectoris: Secondary | ICD-10-CM

## 2022-07-15 DIAGNOSIS — I509 Heart failure, unspecified: Secondary | ICD-10-CM | POA: Diagnosis not present

## 2022-07-17 ENCOUNTER — Other Ambulatory Visit: Payer: Self-pay | Admitting: Family Medicine

## 2022-07-17 DIAGNOSIS — E1121 Type 2 diabetes mellitus with diabetic nephropathy: Secondary | ICD-10-CM

## 2022-07-17 DIAGNOSIS — I130 Hypertensive heart and chronic kidney disease with heart failure and stage 1 through stage 4 chronic kidney disease, or unspecified chronic kidney disease: Secondary | ICD-10-CM

## 2022-07-17 NOTE — Progress Notes (Unsigned)
[facility] [place]                                     Cardiology Office Note:    Date:  07/19/2022   ID:  Dana [name], DOB December 19, 1960, MRN [id number]  PCP:  [name], MD  [facility] Cardiologist:  [name], MD/ [name], MD and Dr. Burt Knack, MD (TEER) [facility] Electrophysiologist:  None   Referring MD: [name], MD   Chief Complaint  Patient presents with   Follow-up    1 year s/p TEER    History of Present Illness:    Dana [name] is a 62 y.o. female with a hx of CAD s/p previous PCIs, CKD stage IV, PAD s/p toe amputation, PAF s/p ablation, chronic diastolic CHF, DMT1, and severe functional MR s/p TEER (05/18/21) who presents to clinic for 1 year follow up.    Dana [name] has been followed closely by Dr. [name] for quite some time. More recently she was admitted 04/2021 with acute on chronic diastolic heart failure with NYHA Class IIIb symptoms and persistent, marked volume overload, despite outpatient diuretic titration. Echocardiogram at that time showed preserved LVEF of 65-70% with no WMA. Cardiomems was 16 (goal is 10) and ReDs clip also elevated at 48% at admission. She was diuresised however this was complicated by acute on chronic kidney disease stage IV. Right heart cath on 04/27/21 showed mild to moderately elevated filling pressures with very large v-waves in PCWP tracing suggestive of severe diastolic dysfunction vs severe MR. Subsequent TEE confirmed the presence of severe MR and she was referred to the structural heart team after discharge.   She ultimately was found to be a candidate for TEER and  underwent transcatheter mitral edge-to-edge repair with placement of 1 XTW clip and 1 NTW clip. The NTW clip was entangled and had to be deployed in the lateral part of the valve. No further clip placement could be pursued due to the proximity of both clips. The final V waves were 52 mmHg with residual  moderate to severe mitral regurgitation. She was continued on DAPT with ASA and Plavix. POD1 echo showed EF 50-55%, normally functioning clips with moderate residual MR with a mean gradient of 5 mmHg. Pulmonary vein flow could not be obtained.    She was seen in follow up 05/25/21 at which time she continued to feel unwell. She reported chronic dyspnea on exertion which was unchanged since her MitraClip procedure with mild worsening in her lower extremity edema. Case discussed with [name] and plans were to follow for at least 3 months and re-evaluate for potential re-clip.   On follow up, her symptoms were improved. In the interm, she has been followed closely by AHF team and was last seen by Dr. [name] 04/2022. At that time she had complaints of fatigue but was otherwise stable.   She is here today with her husband and continues to have fatigue. She is currently being followed and treated by heme/onc for anemia with procrit with little improvement in her symptoms. She has some chronic LE edema and wears compression stockings. Weight has remained stable. She denies chest pain, SOB, palpitations, orthopnea, dizziness, or syncope. No bleeding in stool or urine. Tolerating meds well.      Past Medical History:  Diagnosis Date   Acquired absence of left great toe ([place])  Acquired absence of other left toe(s) ([place])    ACS (acute coronary syndrome) ()    Anemia    Anoxic brain injury ([place])    Anxiety    CAD (coronary artery disease)    DES mLAD 04/07/15; DES dRCA & DES pPDA 08/30/16; DES mCX & PTCA OM1 09/29/20   Chronic diastolic (congestive) heart failure (HCC)    Chronic pain    Chronic systolic (congestive) heart failure (HCC)    CKD (chronic kidney disease)    Contrast dye induced nephropathy, possible 09/03/2020   COPD (chronic obstructive pulmonary disease) (HCC)    Diabetes (HCC)    Diastolic CHF (HCC)    Dyspnea    Family history of breast cancer 10/23/2021   Gastro-esophageal  reflux disease without esophagitis    Hyperlipidemia    Hypertension    Hypertensive heart disease with heart failure (HCC)    Hypoxemia    Idiopathic gout, multiple sites    Leukocytosis 03/29/2022   Localized edema    Low back pain    Mitral regurgitation    Mitral regurgitation    Mixed hyperlipidemia    Mixed incontinence    Morbid (severe) obesity due to excess calories (HCC)    Neuromuscular disorder (HCC)    Neuropathy    Non-ST elevation (NSTEMI) myocardial infarction ([place])    08/29/20, 09/29/20   OSA (obstructive sleep apnea) 09/03/2020   Other specified hypothyroidism    PAF (paroxysmal atrial fibrillation) (HCC)    s/p pulmonary vein isolation by cryoablation 10/04/15 Dr. Minna Merritts in [facility]   PEA (Pulseless electrical activity) [facility])    ~ 01/13/21 at [facility] during admission DKA, volume overload, possible CAP, hypoxia s/p ACLS with ROSC ~ 10-15   Pneumonia    Primary insomnia    Rheumatoid arthritis ([place])    Stroke (Normal)    Thyroid disease    Type 2 diabetes mellitus with diabetic nephropathy ([name])    Vitamin D deficiency     Past Surgical History:  Procedure Laterality Date   ABDOMINAL HYSTERECTOMY     Partial- Still has both ovaries   CARDIOVASCULAR STRESS TEST  08/13/2014   Nuclear; Normal   CARPAL TUNNEL RELEASE     CATARACT EXTRACTION, BILATERAL     CESAREAN SECTION     CHOLECYSTECTOMY     CORONARY ANGIOPLASTY WITH STENT PLACEMENT     3 blockages/ 1 stent   CORONARY STENT INTERVENTION N/A 09/29/2020   Procedure: CORONARY STENT INTERVENTION;  Surgeon: Sherren Mocha, MD;  Location: [place] [facility];  Service: Cardiovascular;  Laterality: N/A;  CFX   EYE SURGERY     LEFT HEART CATH AND CORONARY ANGIOGRAPHY N/A 09/29/2020   Procedure: LEFT HEART CATH AND CORONARY ANGIOGRAPHY;  Surgeon: Sherren Mocha, MD;  Location: [place] [facility];  Service: Cardiovascular;  Laterality: N/A;   MITRAL VALVE REPAIR N/A 05/18/2021   Procedure: MITRAL VALVE REPAIR;   Surgeon: Early Osmond, MD;  Location: [place] [facility];  Service: Cardiovascular;  Laterality: N/A;   RIGHT HEART CATH N/A 04/27/2021   Procedure: RIGHT HEART CATH;  Surgeon: Jolaine Artist, MD;  Location: [place] [facility];  Service: Cardiovascular;  Laterality: N/A;   RIGHT/LEFT HEART CATH AND CORONARY ANGIOGRAPHY N/A 01/07/2017   Procedure: Right/Left Heart Cath and Coronary Angiography;  Surgeon: Larey Dresser, MD;  Location: [place] [facility];  Service: Cardiovascular;  Laterality: N/A;   ROTATOR CUFF REPAIR     TEE WITHOUT CARDIOVERSION N/A 04/28/2021  Procedure: TRANSESOPHAGEAL ECHOCARDIOGRAM (TEE);  Surgeon: Jolaine Artist, MD;  Location: [facility] ENDOSCOPY;  Service: Cardiovascular;  Laterality: N/A;   TEE WITHOUT CARDIOVERSION N/A 05/18/2021   Procedure: TRANSESOPHAGEAL ECHOCARDIOGRAM (TEE);  Surgeon: Early Osmond, MD;  Location: [place] [facility];  Service: Cardiovascular;  Laterality: N/A;   TOE AMPUTATION Left    US ECHOCARDIOGRAPHY  05/2017   Normal    Current Medications: Current Meds  Medication Sig   allopurinol (ZYLOPRIM) 300 MG tablet Take 1 tablet (300 mg total) by mouth daily.   busPIRone (BUSPAR) 5 MG tablet Take 1 tablet (5 mg total) by mouth 3 (three) times daily.   carvedilol (COREG) 12.5 MG tablet TAKE ONE TABLET BY MOUTH TWICE DAILY   clopidogrel (PLAVIX) 75 MG tablet Take 1 tablet (75 mg total) by mouth daily.   Continuous Blood Gluc Sensor (FREESTYLE LIBRE 2 SENSOR) MISC USE TO check blood glucose AS DIRECTED AND CHANGE sensor every 14 DAYS   EPINEPHrine 0.3 mg/0.3 mL IJ SOAJ injection Use as directed for life-threatening allergic reaction.   Evolocumab (REPATHA SURECLICK) 638 MG/ML SOAJ Inject 1 Dose into the skin every 14 (fourteen) days.   ezetimibe (ZETIA) 10 MG tablet Take 1 tablet (10 mg total) by mouth daily.   hydrALAZINE (APRESOLINE) 25 MG tablet Take 1 tablet (25 mg total) by mouth 3 (three) times daily.   HYDROcodone-acetaminophen  (NORCO) 7.5-325 MG tablet TAKE ONE TABLET BY MOUTH THREE TIMES DAILY AS NEEDED FOR moderate pain   insulin aspart (NOVOLOG FLEXPEN) 100 UNIT/ML FlexPen 2-10 U before meals based on SSI.   Insulin Pen Needle (BD PEN NEEDLE NANO 2ND GEN) 32G X 4 MM MISC Inject 1 each into the skin in the morning, at noon, in the evening, and at bedtime.   isosorbide mononitrate (IMDUR) 30 MG 24 hr tablet Take 1 tablet (30 mg total) by mouth daily.   lactulose (CHRONULAC) 10 GM/15ML solution Take 15 mLs (10 g total) by mouth daily as needed for mild constipation.   latanoprost (XALATAN) 0.005 % ophthalmic solution Place 1 drop into both eyes at bedtime.   LEVEMIR FLEXPEN 100 UNIT/ML FlexPen INJECT 50 units into THE SKIN DAILY   LORazepam (ATIVAN) 0.5 MG tablet TAKE ONE TABLET BY MOUTH ONCE daily AS NEEDED FOR ANXIETY   magnesium oxide (MAG-OX) 400 MG tablet Take 2 tablets (800 mg total) by mouth daily. Take 2 (400 mg) daily=800 mg   nitrofurantoin (MACRODANTIN) 50 MG capsule Take 50 mg by mouth at bedtime.   nitroGLYCERIN (NITROSTAT) 0.4 MG SL tablet Dissolve 1 tab under tongue as needed for chest pain. May repeat every 5 minutes x 2 doses. If no relief call 9-1-1. Please call [phone] to schedule an appointment for future refills. Thank you.   Omega-3 Fatty Acids (FISH OIL PO) Take 1 capsule by mouth daily.   OXYGEN Inhale 2 L into the lungs at bedtime.   pantoprazole (PROTONIX) 40 MG tablet Take 1 tablet (40 mg total) by mouth daily.   Potassium Chloride ER 20 MEQ TBCR Take 1 tablet by mouth daily.   potassium chloride SA (KLOR-CON M) 20 MEQ tablet TAKE ONE TABLET BY MOUTH ONCE daily   Probiotic CAPS Take 1 capsule by mouth daily.   promethazine (PHENERGAN) 25 MG tablet TAKE ONE TABLET BY MOUTH daily AS NEEDED   torsemide (DEMADEX) 20 MG tablet TAKE 4 TABLETS BY MOUTH ONCE DAILY     Allergies:   Naproxen, Celecoxib, Aspirin, Glucosamine, Iron, Metoclopramide, Metolazone, Other, Shellfish-derived products,  Statins, Strawberry (diagnostic), Strawberry extract, Strawberry flavor, Strawberry leaves extract, and Tramadol   Social History   Socioeconomic History   Marital status: Married    Spouse name: [name]   Number of children: 2   Years of education: 16   Highest education level: Master's degree (e.g., MA, MS, MEng, MEd, MSW, MBA)  Occupational History   Occupation: on disability/retired early former Education officer, museum  Tobacco Use   Smoking status: Former    Types: Cigarettes    Start date: 1983    Quit date: 1984    Years since quitting: 40.0   Smokeless tobacco: Never  Vaping Use   Vaping Use: Never used  Substance and Sexual Activity   Alcohol use: Never   Drug use: Never   Sexual activity: Not on file  Other Topics Concern   Not on file  Social History Narrative   Not on file   Social Determinants of Health   Financial Resource Strain: Low Risk  (07/11/2022)   Overall Financial Resource Strain (CARDIA)    Difficulty of Paying Living Expenses: Not hard at all  Food Insecurity: No Food Insecurity (04/27/2021)   Hunger Vital Sign    Worried About Running Out of Food in the Last Year: Never true    Ran Out of Food in the Last Year: Never true  Transportation Needs: No Transportation Needs (07/11/2022)   PRAPARE - Hydrologist (Medical): No    Lack of Transportation (Non-Medical): No  Physical Activity: Insufficiently Active (02/15/2020)   Exercise Vital Sign    Days of Exercise per Week: 3 days    Minutes of Exercise per Session: 40 min  Stress: Not on file  Social Connections: Not on file    Family History: The patient's family history includes Bipolar disorder in an other family member; Breast cancer (age of onset: 30) in her mother; Cancer in an other family member; Coronary artery disease in her mother; Depression in an other family member; Diabetes in her father; Diabetes type II in her mother; Hyperlipidemia in her mother; Hypertension in her  father and mother; Lung cancer (age of onset: 36) in her mother; Mental illness in her sister; Schizophrenia in an other family member.  ROS:   Please see the history of present illness.    All other systems reviewed and are negative.  EKGs/Labs/Other Studies Reviewed:    The following studies were reviewed today:  Echocardiogram 07/18/22:   1. Left ventricular ejection fraction, by estimation, is 55 to 60%. The  left ventricle has normal function. The left ventricle has no regional  wall motion abnormalities. Indeterminate diastolic filling due to E-A  fusion.   2. Right ventricular systolic function is normal. The right ventricular  size is normal.   3. Left atrial size was mildly dilated.   4. The mitral valve has been repaired/replaced. Moderate mitral valve  regurgitation. The mean mitral valve gradient is 7.5 mmHg with average  heart rate of 75 bpm. There is a Mitra-Clip present in the mitral  position. Procedure Date: 05/18/2021.   5. The aortic valve is grossly normal. Aortic valve regurgitation is not  visualized. No aortic stenosis is present.   6. The inferior vena cava is normal in size with greater than 50%  respiratory variability, suggesting right atrial pressure of 3 mmHg.   Comparison(s): No significant change from prior study.    Echocardiogram 07/12/21:   1. Left ventricular ejection fraction, by estimation, is 60 to 65%.  The  left ventricle has normal function. The left ventricle has no regional  wall motion abnormalities. Left ventricular diastolic parameters are  indeterminate.   2. Right ventricular systolic function is mildly reduced. The right  ventricular size is mildly enlarged. There is mildly elevated pulmonary  artery systolic pressure.   3. Left atrial size was moderately dilated.   4. Patient is s/p mitral clip with XTW between A2/P2. Had second clip NTW  lateral to this. Apparently this clip was only attached to the anterior  leaflet. Desptie  this the clips seem in stable position with moderate  residual MR between the clips and  dual mitral inflows on either side suggesting at least one of the clips  has a good tissue bridge . The mitral valve has been repaired/replaced.  Moderate mitral valve regurgitation. No evidence of mitral stenosis.  Procedure Date: 05/18/2021.   5. The aortic valve is tricuspid. There is mild calcification of the  aortic valve. Aortic valve regurgitation is not visualized. Aortic valve  sclerosis is present, with no evidence of aortic valve stenosis.   6. The inferior vena cava is normal in size with greater than 50%  respiratory variability, suggesting right atrial pressure of 3 mmHg.    TEER 05/18/21:   MITRAL VALVE REPAIR    Conclusion   1.  Status post transcatheter mitral edge-to-edge repair with placement of 1 XT W clip and 1 NT W clip.  The NT W clip was entangled and had to be deployed in the lateral part of the valve.  No further clip placement could be pursued due to the proximity of both clips.  The final V waves were 52 mmHg with residual moderate to severe mitral regurgitation.  The results were reviewed with Dr. [name].   _____________   Echo 05/19/21: IMPRESSIONS   1. Left ventricular ejection fraction, by estimation, is 50 to 55%. The  left ventricle has low normal function. The left ventricle demonstrates  regional wall motion abnormalities (see scoring diagram/findings for  description). Left ventricular diastolic   function could not be evaluated. Elevated left atrial pressure. There is  severe hypokinesis of the left ventricular, basal-mid inferolateral wall.   2. Right ventricular systolic function is mildly reduced. The right  ventricular size is mildly enlarged. Tricuspid regurgitation signal is  inadequate for assessing PA pressure.   3. Left atrial size was mildly dilated.   4. There are two MitraClips that appear well-seated, stable in position.  residual MR is seen  from the central area of the commisure, between the  clips. Images are technically difficult, but overall MR severity is  probably moderate. Pulmonary vein flow  could not be obtained. The mitral valve has been repaired/replaced.  Moderate mitral valve regurgitation. The mean mitral valve gradient is 5.0  mmHg. Procedure Date: 05/18/2021.   5. The aortic valve is tricuspid. Aortic valve regurgitation is not  visualized. No aortic stenosis is present.   6. The inferior vena cava is dilated in size with <50% respiratory  variability, suggesting right atrial pressure of 15 mmHg.   Comparison(s): Images are tehnically difficult. Overall mitral  insufficiency appears mild-to-moderate on images available, but is  probably at least moderate.   EKG:  EKG is not ordered today.    Recent Labs: 09/25/2021: B Natriuretic Peptide 149.0 01/19/2022: TSH 2.900 04/25/2022: ALT 16 05/01/2022: BUN 61; Creatinine, Ser 2.15; Potassium 4.1; Sodium 140 06/21/2022: Hemoglobin 10.0; Platelet Count 279   Recent Lipid Panel    Component  Value Date/Time   CHOL 87 (L) 04/25/2022 0813   TRIG 91 04/25/2022 0813   HDL 54 04/25/2022 0813   CHOLHDL 1.6 04/25/2022 0813   CHOLHDL 2.4 09/30/2020 0241   VLDL 15 09/30/2020 0241   LDLCALC 15 04/25/2022 0813   Physical Exam:    VS:  BP 122/62   Pulse 71   Ht '5\' 4"'$  (1.626 m)   Wt 234 lb (106.1 kg)   SpO2 96%   BMI 40.17 kg/m     Wt Readings from Last 3 Encounters:  07/18/22 234 lb (106.1 kg)  07/12/22 234 lb (106.1 kg)  06/28/22 236 lb 4 oz (107.2 kg)    General: Well developed, well nourished, NAD Lungs:Clear to ausculation bilaterally. No wheezes, rales, or rhonchi. Breathing is unlabored. Cardiovascular: RRR with S1 S2. + murmur Extremities: No edema.  Neuro: Alert and oriented. No focal deficits. No facial asymmetry. MAE spontaneously. Psych: Responds to questions appropriately with normal affect.    ASSESSMENT/PLAN:    Severe MR s/p TEER: Patient  doing well with NYHA class II symptoms s/p TEER. Echocardiogram today with moderate residual MR with a mean gradient at 7.64mHg and peak at 20.835mg, essentially unchanged from prior echo. Continued on Plavix. Will require lifelong dental SBE with amoxicillin 2g PO one hour prior to dental procedures.   Chronic anemia: Follows closely with heme/onc with plans for labs tomorrow. Last labs reviewed from 06/2022 that appear to be at most recent baseline.     Chronic diastolic CHF: Follow closely with AHF clinic, last seen earlier 04/2022 with recall in for 08/2022. Weight appears to be very stable and does not appear to be volume overloaded on exam. Asymptomatic. No changes needed at this time.    CKD stage IV: Creatinine baseline in the 2.0 range. Follows with PCP.    CAD: Denies anginal symptoms. Continue on Plavix, statin.    PAF: s/p remote ablation in [place]. ZIO from 8/22 showed mostly SB/SR, 66 SVT runs, frequent isolated SVE (11.2%) and no AF. Continue current regimen with carvedilol 12.'5mg'$  BID. No oral anticoagulation in the setting of GI bleeding and no recent AF recurrence. Stable on Plavix.    Medication Adjustments/Labs and Tests Ordered: Current medicines are reviewed at length with the patient today.  Concerns regarding medicines are outlined above.  No orders of the defined types were placed in this encounter.  No orders of the defined types were placed in this encounter.   Patient Instructions  Medication Instructions:  Your physician recommends that you continue on your current medications as directed. Please refer to the Current Medication list given to you today.  *If you need a refill on your cardiac medications before your next appointment, please call your pharmacy*   Lab Work: NONE If you have labs (blood work) drawn today and your tests are completely normal, you will receive your results only by: [facility] you have MyChart) OR A paper copy in the mail If you  have any lab test that is abnormal or we need to change your treatment, we will call you to review the results.   Testing/Procedures: NONE   Follow-Up: [facility] your [facility] needs are our priority.  As part of our continuing mission to provide you with exceptional heart care, we have created designated Provider Care Teams.  These Care Teams include your primary Cardiologist (physician) and Advanced Practice Providers (APPs -  Physician Assistants and Nurse Practitioners) who all work together to provide  you with the care you need, when you need it.  We recommend signing up for the patient portal called "MyChart".  Sign up information is provided on this After Visit Summary.  MyChart is used to connect with patients for Virtual Visits (Telemedicine).  Patients are able to view lab/test results, encounter notes, upcoming appointments, etc.  Non-urgent messages can be sent to your provider as well.   To learn more about what you can do with MyChart, go to [email or web address].    Your next appointment:   FOLLOW UP WITH DR. [name]  Important Information About Sugar          Signed, Kathyrn Drown, NP  07/19/2022 9:39 AM    [facility]

## 2022-07-18 ENCOUNTER — Ambulatory Visit: Payer: PPO | Attending: Cardiology

## 2022-07-18 ENCOUNTER — Encounter: Payer: Self-pay | Admitting: Oncology

## 2022-07-18 ENCOUNTER — Ambulatory Visit (INDEPENDENT_AMBULATORY_CARE_PROVIDER_SITE_OTHER): Payer: PPO | Admitting: Cardiology

## 2022-07-18 VITALS — BP 122/62 | HR 71 | Ht 64.0 in | Wt 234.0 lb

## 2022-07-18 DIAGNOSIS — N1832 Chronic kidney disease, stage 3b: Secondary | ICD-10-CM | POA: Insufficient documentation

## 2022-07-18 DIAGNOSIS — I10 Essential (primary) hypertension: Secondary | ICD-10-CM

## 2022-07-18 DIAGNOSIS — I2510 Atherosclerotic heart disease of native coronary artery without angina pectoris: Secondary | ICD-10-CM | POA: Diagnosis not present

## 2022-07-18 DIAGNOSIS — I5032 Chronic diastolic (congestive) heart failure: Secondary | ICD-10-CM

## 2022-07-18 DIAGNOSIS — Z9889 Other specified postprocedural states: Secondary | ICD-10-CM | POA: Diagnosis not present

## 2022-07-18 DIAGNOSIS — Z95818 Presence of other cardiac implants and grafts: Secondary | ICD-10-CM

## 2022-07-18 LAB — ECHOCARDIOGRAM COMPLETE
Area-P 1/2: 3.54 cm2
MV M vel: 5.39 m/s
MV Peak grad: 116.2 mmHg
MV VTI: 1.3 cm2
S' Lateral: 3.3 cm

## 2022-07-18 MED ORDER — PERFLUTREN LIPID MICROSPHERE
1.0000 mL | INTRAVENOUS | Status: AC | PRN
  Administered 2022-07-18: 3 mL via INTRAVENOUS

## 2022-07-18 NOTE — Assessment & Plan Note (Signed)
Leukocytosis of uncertain etiology.  She has persistent mild leukocytosis with a normal differential, which is improved from previous.  We do not feel this represents a hematologic malignancy.  We will continue to monitor this.

## 2022-07-18 NOTE — Assessment & Plan Note (Signed)
Chronic anemia that has been worsening.  This is anemia of chronic kidney disease/anemia of chronic disease, for which she has been on epoetin since March.  She did not have a robust response, so we recommended epoetin 20,000 units every 2 weeks with improvement in her hemoglobin.  Repeat repeat iron studies, B12 and folate in September did not reveal evidence of nutritional deficiency, so we continued epoetin every 2 weeks.  Her hemoglobin has been fairly stable.  I will continue epoetin every 2 weeks and plan to see her back in 3 months with a CBC and comprehensive metabolic panel.

## 2022-07-18 NOTE — Progress Notes (Signed)
Dana [name]  [address] [place],  [place]  [address] [phone]  Clinic Day:  07/19/2022  Referring physician: [name], MD  ASSESSMENT & PLAN:   Assessment & Plan: Anemia due to stage 3b chronic kidney disease ([place]) Chronic anemia that has been worsening.  This is anemia of chronic kidney disease/anemia of chronic disease, for which she has been on epoetin since March.  She did not have a robust response, so we recommended epoetin 20,000 units every 2 weeks with improvement in her hemoglobin.  Repeat repeat iron studies, B12 and folate in September did not reveal evidence of nutritional deficiency, so we continued epoetin every 2 weeks.  Her hemoglobin has been fairly stable.  I will continue epoetin every 2 weeks and plan to see her back in 3 months with a CBC and comprehensive metabolic panel.  Leukocytosis Leukocytosis of uncertain etiology.  She has persistent mild leukocytosis with a normal differential, which is improved from previous.  We do not feel this represents a hematologic malignancy.  We will continue to monitor this.   The patient understands the plans discussed today and is in agreement with them.  She knows to contact our office if she develops concerns prior to her next appointment.   I provided 20 minutes of face-to-face time during this encounter and > 50% was spent counseling as documented under my assessment and plan.    Dana Pickles, PA-C  [facility] AT [facility] [address] [place] [address] Dept: [phone] Dept Fax: [phone]   Orders Placed This Encounter  Procedures   Ferritin    Standing Status:   Future    Number of Occurrences:   1    Standing Expiration Date:   07/20/2023   Iron and TIBC    Standing Status:   Future    Number of Occurrences:   1    Standing Expiration Date:   07/20/2023   Folate    Standing Status:   Future    Number of  Occurrences:   1    Standing Expiration Date:   07/20/2023   Vitamin B12    Standing Status:   Future    Number of Occurrences:   1    Standing Expiration Date:   07/20/2023      CHIEF COMPLAINT:  CC: Anemia of chronic kidney disease  Current Treatment: Epoetin every 2 weeks  HISTORY OF PRESENT ILLNESS:  Dana [name] is a 62 y.o. female with a complicated medical history, including stage IV chronic kidney disease.  She has had chronic anemia since 2010.  She had worsening anemia, so was given a dose of Retacrit in December.  She then was then found to have a low iron saturation of 11% with a TIBC of 271 and ferritin 106 in February, and gave a history of allergic reaction to IV iron, so was referred here.  She states she broke out in severe hives after receiving IV iron in the past 1 to 2 years.  She has had blood transfusion on several occasions, most recently in December 2022.  She has a history of hypoxic brain injury in June 2022, so has difficulty with dates. In 2017, when she reported rectal bleeding, as well as black tarry stools, EGD and colonoscopy were recommended.  EGD in September 2017 revealed a pyloric erosion.  We were unable to access the colonoscopy report from September 2017.  She was given IV Feraheme in February 2022 when admitted for acute on chronic diastolic heart failure.  She reported severe generalized itching after receiving the first dose of Feraheme.  No hives were noted by the examining provider.  The 2nd dose of Feraheme was not given.  She was transfused in February 2022, when her hemoglobin was 7.8 and again in March 2022 when her hemoglobin was 7.   Due to her family history of malignancy, she underwent hereditary cancer genetic testing in April 2023, which did not reveal any pathogenic variants or variants of unknown significance in [place] [facility].    INTERVAL HISTORY:  Dana [name] is here today for repeat clinical assessment.  She reports  persistent fatigue, but denies progressive fatigue concerning for worsening anemia.  She reports shortness of breath with exertion with occasional chest comfort, which is attributed to her heart disease.  She denies any overt form of blood loss.  She has occasional diarrhea, but not severe.  She denies fevers or chills. She reports chronic low back pain pain. Her appetite is good. Her weight has increased 1 pounds over last 1 week .  She continues to ambulate with a cane mainly due to low back pain.  She saw a rheumatologist who is now referred her to a neurologist.  She states she has been discharged from the cardiologist who did her mitral valve procedure.  REVIEW OF SYSTEMS:  Review of Systems  Constitutional:  Positive for fatigue. Negative for appetite change, chills, fever and unexpected weight change.  HENT:   Negative for lump/mass, mouth sores and sore throat.   Respiratory:  Positive for shortness of breath. Negative for cough.   Cardiovascular:  Negative for chest pain and leg swelling.  Gastrointestinal:  Positive for diarrhea. Negative for abdominal pain, constipation, nausea and vomiting.  Endocrine: Negative for hot flashes.  Genitourinary:  Negative for difficulty urinating, dysuria, frequency and hematuria.   Musculoskeletal:  Positive for back pain. Negative for arthralgias and myalgias.  Skin:  Negative for rash.  Neurological:  Negative for dizziness and headaches.  Hematological:  Negative for adenopathy. Does not bruise/bleed easily.  Psychiatric/Behavioral:  Negative for depression and sleep disturbance. The patient is not nervous/anxious.      VITALS:  Blood pressure (!) 132/58, pulse 73, temperature 97.9 F (36.6 C), temperature source Oral, resp. rate 18, height '5\' 4"'$  (1.626 m), weight 235 lb 6.4 oz (106.8 kg), SpO2 99 %.  Wt Readings from Last 3 Encounters:  07/19/22 235 lb 6.4 oz (106.8 kg)  07/18/22 234 lb (106.1 kg)  07/12/22 234 lb (106.1 kg)    Body mass  index is 40.41 kg/m.  Performance status (ECOG): 1 - Symptomatic but completely ambulatory  PHYSICAL EXAM:  Physical Exam Vitals and nursing note reviewed.  Constitutional:      General: She is not in acute distress.    Appearance: Normal appearance.  HENT:     Head: Normocephalic and atraumatic.     Mouth/Throat:     Mouth: Mucous membranes are moist.     Pharynx: Oropharynx is clear. No oropharyngeal exudate or posterior oropharyngeal erythema.  Eyes:     General: No scleral icterus.    Extraocular Movements: Extraocular movements intact.     Conjunctiva/sclera: Conjunctivae normal.     Pupils: Pupils are equal, round, and reactive to light.  Cardiovascular:     Rate and Rhythm: Normal rate and regular rhythm.     Heart sounds: Normal heart sounds. No murmur  heard.    No friction rub. No gallop.  Pulmonary:     Effort: Pulmonary effort is normal.     Breath sounds: Normal breath sounds. No wheezing, rhonchi or rales.  Abdominal:     General: There is no distension.     Palpations: Abdomen is soft. There is no hepatomegaly, splenomegaly or mass.     Tenderness: There is no abdominal tenderness.  Musculoskeletal:        General: Normal range of motion.     Cervical back: Normal range of motion and neck supple. No tenderness.     Right lower leg: Edema (1+) present.     Left lower leg: Edema (1+) present.  Lymphadenopathy:     Cervical: No cervical adenopathy.     Upper Body:     Right upper body: No supraclavicular or axillary adenopathy.     Left upper body: No supraclavicular or axillary adenopathy.     Lower Body: No right inguinal adenopathy. No left inguinal adenopathy.  Skin:    General: Skin is warm and dry.     Coloration: Skin is not jaundiced.     Findings: No rash.  Neurological:     Mental Status: She is alert and oriented to person, place, and time.     Cranial Nerves: No cranial nerve deficit.  Psychiatric:        Mood and Affect: Mood normal.         Behavior: Behavior normal.        Thought Content: Thought content normal.     LABS:      Latest Ref Rng & Units 07/19/2022   10:05 AM 06/21/2022   10:41 AM 05/24/2022   10:40 AM  CBC  WBC 4.0 - 10.5 K/uL 10.7  12.4  11.9   Hemoglobin 12.0 - 15.0 g/dL 9.6  10.0  9.8   Hematocrit 36.0 - 46.0 % 31.6  32.5  31.5   Platelets 150 - 400 K/uL 281  279  289       Latest Ref Rng & Units 07/19/2022   10:05 AM 05/01/2022    3:16 PM 04/25/2022    8:13 AM  CMP  Glucose 70 - 99 mg/dL 163  115  130   BUN 6 - 20 mg/dL 50  61  58   Creatinine 0.44 - 1.00 mg/dL 1.88  2.15  2.16   Sodium 135 - 145 mmol/L 139  140  141   Potassium 3.5 - 5.1 mmol/L 4.1  4.1  3.9   Chloride 98 - 111 mmol/L 104  101  99   CO2 22 - 32 mmol/L '25  24  23   '$ Calcium 8.9 - 10.3 mg/dL 8.8  9.1  9.3   Total Protein 6.5 - 8.1 g/dL 7.5   7.5   Total Bilirubin 0.3 - 1.2 mg/dL 0.5   0.4   Alkaline Phos 38 - 126 U/L 133   177   AST 15 - 41 U/L 18   16   ALT 0 - 44 U/L 16   16      No results found for: "CEA1", "CEA" / No results found for: "CEA1", "CEA" No results found for: "PSA1" No results found for: "LNL892" No results found for: "CAN125"  Lab Results  Component Value Date   TOTALPROTELP 7.3 09/13/2021   ALBUMINELP 3.5 09/13/2021   A1GS 0.3 09/13/2021   A2GS 0.8 09/13/2021   BETS 1.0 09/13/2021   GAMS 1.8 09/13/2021   MSPIKE  Not Observed 09/13/2021   Lab Results  Component Value Date   TIBC 263 03/29/2022   TIBC 333 12/07/2021   TIBC 328 09/13/2021   FERRITIN 141 03/29/2022   FERRITIN 34 12/07/2021   FERRITIN 64 09/13/2021   IRONPCTSAT 20 03/29/2022   IRONPCTSAT 12 12/07/2021   IRONPCTSAT 12 09/13/2021   Lab Results  Component Value Date   LDH 176 09/13/2021    STUDIES:  ECHOCARDIOGRAM COMPLETE  Result Date: 07/18/2022    ECHOCARDIOGRAM REPORT   Patient Name:   MYSHA PEELER Date of Exam: 07/18/2022 Medical Rec #:  [id number]      Height:       64.0 in Accession #:    [id number]     Weight:        234.0 lb Date of Birth:  May 02, 1961      BSA:          2.091 m Patient Age:    89 years       BP:           122/60 mmHg Patient Gender: F              HR:           70 bpm. Exam Location:  [facility] Procedure: 2D Echo, Cardiac Doppler, Color Doppler and Intracardiac            Opacification Agent Indications:    Z98.890 S/P Mitral valve clip implantation; I34.0 Mitral valve                 insufficiency  History:        Patient has prior history of Echocardiogram examinations, most                 recent 09/18/2021. CHF, CAD; Risk Factors:Diabetes, Dyslipidemia                 and Sleep Apnea. S/P ablation of atrial fibrillation. Chronic                 pain syndrome. Obesity. PVD.                  Mitral Valve: Mitra-Clip valve is present in the mitral                 position. Procedure Date: 05/18/2021.  Sonographer:    Diamond Nickel RCS Referring Phys: Elnita Maxwell [facility] IMPRESSIONS  1. Left ventricular ejection fraction, by estimation, is 55 to 60%. The left ventricle has normal function. The left ventricle has no regional wall motion abnormalities. Indeterminate diastolic filling due to E-A fusion.  2. Right ventricular systolic function is normal. The right ventricular size is normal.  3. Left atrial size was mildly dilated.  4. The mitral valve has been repaired/replaced. Moderate mitral valve regurgitation. The mean mitral valve gradient is 7.5 mmHg with average heart rate of 75 bpm. There is a Mitra-Clip present in the mitral position. Procedure Date: 05/18/2021.  5. The aortic valve is grossly normal. Aortic valve regurgitation is not visualized. No aortic stenosis is present.  6. The inferior vena cava is normal in size with greater than 50% respiratory variability, suggesting right atrial pressure of 3 mmHg. Comparison(s): No significant change from prior study. Conclusion(s)/Recommendation(s): S/P Mitraclip x2. Study unchanged from prior. FINDINGS  Left Ventricle: Left ventricular ejection fraction, by  estimation, is 55 to 60%. The left ventricle has normal function. The left ventricle has no regional wall motion abnormalities. Definity contrast agent  was given IV to delineate the left ventricular  endocardial borders. The left ventricular internal cavity size was normal in size. There is no left ventricular hypertrophy. Indeterminate diastolic filling due to E-A fusion. Right Ventricle: The right ventricular size is normal. No increase in right ventricular wall thickness. Right ventricular systolic function is normal. Left Atrium: Left atrial size was mildly dilated. Right Atrium: Right atrial size was normal in size. Pericardium: There is no evidence of pericardial effusion. Mitral Valve: The mitral valve has been repaired/replaced. Moderate mitral valve regurgitation. There is a Mitra-Clip present in the mitral position. Procedure Date: 05/18/2021. MV peak gradient, 20.8 mmHg. The mean mitral valve gradient is 7.5 mmHg with average heart rate of 75 bpm. Tricuspid Valve: The tricuspid valve is normal in structure. Tricuspid valve regurgitation is trivial. No evidence of tricuspid stenosis. Aortic Valve: The aortic valve is grossly normal. Aortic valve regurgitation is not visualized. No aortic stenosis is present. Pulmonic Valve: The pulmonic valve was not well visualized. Pulmonic valve regurgitation is trivial. No evidence of pulmonic stenosis. Aorta: The aortic root, ascending aorta, aortic arch and descending aorta are all structurally normal, with no evidence of dilitation or obstruction. Venous: The inferior vena cava is normal in size with greater than 50% respiratory variability, suggesting right atrial pressure of 3 mmHg. IAS/Shunts: The atrial septum is grossly normal.  LEFT VENTRICLE PLAX 2D LVIDd:         5.10 cm LVIDs:         3.30 cm LV PW:         0.80 cm LV IVS:        0.80 cm LVOT diam:     1.90 cm LV SV:         72 LV SV Index:   34 LVOT Area:     2.84 cm  RIGHT VENTRICLE TAPSE (M-mode): 2.0 cm  LEFT ATRIUM             Index        RIGHT ATRIUM           Index LA diam:        4.00 cm 1.91 cm/m   RA Area:     13.30 cm LA Vol (A2C):   59.5 ml 28.45 ml/m  RA Volume:   29.40 ml  14.06 ml/m LA Vol (A4C):   76.2 ml 36.44 ml/m LA Biplane Vol: 68.2 ml 32.61 ml/m  AORTIC VALVE LVOT Vmax:   102.00 cm/s LVOT Vmean:  68.600 cm/s LVOT VTI:    0.253 m  AORTA Ao Root diam: 3.00 cm Ao Asc diam:  3.10 cm MITRAL VALVE MV Area (PHT): 3.54 cm     SHUNTS MV Area VTI:   1.30 cm     Systemic VTI:  0.25 m MV Peak grad:  20.8 mmHg    Systemic Diam: 1.90 cm MV Mean grad:  7.5 mmHg MV Vmax:       2.28 m/s MV Vmean:      125.5 cm/s MV Decel Time: 214 msec MR Peak grad: 116.2 mmHg MR Mean grad: 75.0 mmHg MR Vmax:      539.00 cm/s MR Vmean:     408.0 cm/s MV E velocity: 221.00 cm/s MV A velocity: 114.00 cm/s MV E/A ratio:  1.94 Buford Dresser MD Electronically signed by Buford Dresser MD Signature Date/Time: 07/18/2022/4:46:03 PM    Final       HISTORY:   Past Medical History:  Diagnosis Date   Acquired absence of  left great toe ([place])    Acquired absence of other left toe(s) ([place])    ACS (acute coronary syndrome) ([place])    Anemia    Anoxic brain injury ([place])    Anxiety    CAD (coronary artery disease)    DES mLAD 04/07/15; DES dRCA & DES pPDA 08/30/16; DES mCX & PTCA OM1 09/29/20   Chronic diastolic (congestive) heart failure (H[facility])    Chronic pain    Chronic systolic (congestive) heart failure (HCC)    CKD (chronic kidney disease)    Contrast dye induced nephropathy, possible 09/03/2020   COPD (chronic obstructive pulmonary disease) (HCC)    Diabetes (HCC)    Diastolic CHF ([place])    Dyspnea    Family history of breast cancer 10/23/2021   Gastro-esophageal reflux disease without esophagitis    Hyperlipidemia    Hypertension    Hypertensive heart disease with heart failure (HCC)    Hypoxemia    Idiopathic gout, multiple sites    Leukocytosis 03/29/2022   Localized edema    Low back pain     Mitral regurgitation    Mitral regurgitation    Mixed hyperlipidemia    Mixed incontinence    Morbid (severe) obesity due to excess calories (HCC)    Neuromuscular disorder (HCC)    Neuropathy    Non-ST elevation (NSTEMI) myocardial infarction ([place])    08/29/20, 09/29/20   OSA (obstructive sleep apnea) 09/03/2020   Other specified hypothyroidism    PAF (paroxysmal atrial fibrillation) (HCC)    s/p pulmonary vein isolation by cryoablation 10/04/15 Dr. Minna Merritts [facility]   PEA (Pulseless electrical activity) [facility])    ~ 01/13/21 at [facility] during admission DKA, volume overload, possible CAP, hypoxia s/p ACLS with ROSC ~ 10-15   Pneumonia    Primary insomnia    Rheumatoid arthritis ([place])    Stroke ([place])    Thyroid disease    Type 2 diabetes mellitus with diabetic nephropathy ([place])    Vitamin D deficiency     Past Surgical History:  Procedure Laterality Date   ABDOMINAL HYSTERECTOMY     Partial- Still has both ovaries   CARDIOVASCULAR STRESS TEST  08/13/2014   Nuclear; Normal   CARPAL TUNNEL RELEASE     CATARACT EXTRACTION, BILATERAL     CESAREAN SECTION     CHOLECYSTECTOMY     CORONARY ANGIOPLASTY WITH STENT PLACEMENT     3 blockages/ 1 stent   CORONARY STENT INTERVENTION N/A 09/29/2020   Procedure: CORONARY STENT INTERVENTION;  Surgeon: Sherren Mocha, MD;  Location: [place] [facility] LAB;  Service: Cardiovascular;  Laterality: N/A;  CFX   EYE SURGERY     LEFT HEART CATH AND CORONARY ANGIOGRAPHY N/A 09/29/2020   Procedure: LEFT HEART CATH AND CORONARY ANGIOGRAPHY;  Surgeon: Sherren Mocha, MD;  Location: [place] [facility];  Service: Cardiovascular;  Laterality: N/A;   MITRAL VALVE REPAIR N/A 05/18/2021   Procedure: MITRAL VALVE REPAIR;  Surgeon: Early Osmond, MD;  Location: [facility];  Service: Cardiovascular;  Laterality: N/A;   RIGHT HEART CATH N/A 04/27/2021   Procedure: RIGHT HEART CATH;  Surgeon: Jolaine Artist, MD;  Location: [facility];   Service: Cardiovascular;  Laterality: N/A;   RIGHT/LEFT HEART CATH AND CORONARY ANGIOGRAPHY N/A 01/07/2017   Procedure: Right/Left Heart Cath and Coronary Angiography;  Surgeon: Larey Dresser, MD;  Location: [facility];  Service: Cardiovascular;  Laterality: N/A;   ROTATOR CUFF REPAIR  TEE WITHOUT CARDIOVERSION N/A 04/28/2021   Procedure: TRANSESOPHAGEAL ECHOCARDIOGRAM (TEE);  Surgeon: Jolaine Artist, MD;  Location: [facility] ENDOSCOPY;  Service: Cardiovascular;  Laterality: N/A;   TEE WITHOUT CARDIOVERSION N/A 05/18/2021   Procedure: TRANSESOPHAGEAL ECHOCARDIOGRAM (TEE);  Surgeon: Early Osmond, MD;  Location: [place] [facility] LAB;  Service: Cardiovascular;  Laterality: N/A;   TOE AMPUTATION Left    US ECHOCARDIOGRAPHY  05/2017   Normal    Family History  Problem Relation Age of Onset   Breast cancer Mother 16       contralateral breast ca dx 99   Coronary artery disease Mother    Hypertension Mother    Hyperlipidemia Mother    Diabetes type II Mother    Lung cancer Mother 65   Diabetes Father    Hypertension Father    Mental illness Sister    Bipolar disorder Other    Depression Other    Schizophrenia Other    Cancer Other        MGF's sisters x2; unknown type    Social History:  reports that she quit smoking about 40 years ago. Her smoking use included cigarettes. She started smoking about 41 years ago. She has never used smokeless tobacco. She reports that she does not drink alcohol and does not use drugs.The patient is alone today.  Allergies:  Allergies  Allergen Reactions   Naproxen Hives and Rash   Celecoxib Other (See Comments)    Stomach pain   Aspirin Other (See Comments)    CAN NOT TAKE DUE TO ULCERS (documented previously) but takes every day currently (as of 09/29/20) without reported issues Can take coated aspirin   Glucosamine Hives, Swelling and Other (See Comments)    Angioedema    Iron     IV iron transfusion - hives - Feb 2022  hospitalization   Metoclopramide Other (See Comments)    Facial twitching and stuttering   Metolazone Other (See Comments)    Acute renal failure   Other Hives    Strawberry allergy   Shellfish-Derived Products Hives, Swelling and Other (See Comments)    Angioedema    Statins Other (See Comments)    Muscle pain   Strawberry (Diagnostic)    Strawberry Extract    Strawberry Flavor    Strawberry Leaves Extract    Tramadol Nausea And Vomiting         Current Medications: Current Outpatient Medications  Medication Sig Dispense Refill   allopurinol (ZYLOPRIM) 300 MG tablet Take 1 tablet (300 mg total) by mouth daily. 90 tablet 1   busPIRone (BUSPAR) 5 MG tablet Take 1 tablet (5 mg total) by mouth 3 (three) times daily. 270 tablet 1   carvedilol (COREG) 12.5 MG tablet TAKE ONE TABLET BY MOUTH TWICE DAILY 180 tablet 3   clopidogrel (PLAVIX) 75 MG tablet Take 1 tablet (75 mg total) by mouth daily. 90 tablet 1   Continuous Blood Gluc Sensor (FREESTYLE LIBRE 2 SENSOR) MISC USE TO check blood glucose AS DIRECTED AND CHANGE sensor every 14 DAYS     EPINEPHrine 0.3 mg/0.3 mL IJ SOAJ injection Use as directed for life-threatening allergic reaction. 4 Device 3   Evolocumab (REPATHA SURECLICK) 034 MG/ML SOAJ Inject 1 Dose into the skin every 14 (fourteen) days. 6 mL 1   ezetimibe (ZETIA) 10 MG tablet Take 1 tablet (10 mg total) by mouth daily. 90 tablet 3   hydrALAZINE (APRESOLINE) 25 MG tablet Take 1 tablet (25 mg total) by mouth 3 (three)  times daily. 90 tablet 6   HYDROcodone-acetaminophen (NORCO) 7.5-325 MG tablet TAKE ONE TABLET BY MOUTH THREE TIMES DAILY AS NEEDED FOR moderate pain 90 tablet 0   insulin aspart (NOVOLOG FLEXPEN) 100 UNIT/ML FlexPen 2-10 U before meals based on SSI. 15 mL 3   Insulin Pen Needle (BD PEN NEEDLE NANO 2ND GEN) 32G X 4 MM MISC Inject 1 each into the skin in the morning, at noon, in the evening, and at bedtime. 600 each 3   isosorbide mononitrate (IMDUR) 30 MG 24 hr  tablet Take 1 tablet (30 mg total) by mouth daily. 90 tablet 1   lactulose (CHRONULAC) 10 GM/15ML solution Take 15 mLs (10 g total) by mouth daily as needed for mild constipation. 236 mL 1   latanoprost (XALATAN) 0.005 % ophthalmic solution Place 1 drop into both eyes at bedtime.     LEVEMIR FLEXPEN 100 UNIT/ML FlexPen INJECT 50 units into THE SKIN DAILY 15 mL 1   LORazepam (ATIVAN) 0.5 MG tablet TAKE ONE TABLET BY MOUTH ONCE daily AS NEEDED FOR ANXIETY 30 tablet 2   magnesium oxide (MAG-OX) 400 MG tablet Take 2 tablets (800 mg total) by mouth daily. Take 2 (400 mg) daily=800 mg 60 tablet 1   nitrofurantoin (MACRODANTIN) 50 MG capsule Take 50 mg by mouth at bedtime.     nitroGLYCERIN (NITROSTAT) 0.4 MG SL tablet Dissolve 1 tab under tongue as needed for chest pain. May repeat every 5 minutes x 2 doses. If no relief call 9-1-1. Please call [phone] to schedule an appointment for future refills. Thank you. 50 tablet 0   Omega-3 Fatty Acids (FISH OIL PO) Take 1 capsule by mouth daily.     OXYGEN Inhale 2 L into the lungs at bedtime.     pantoprazole (PROTONIX) 40 MG tablet Take 1 tablet (40 mg total) by mouth daily. 90 tablet 1   Potassium Chloride ER 20 MEQ TBCR Take 1 tablet by mouth daily. 90 tablet 1   potassium chloride SA (KLOR-CON M) 20 MEQ tablet TAKE ONE TABLET BY MOUTH ONCE daily 90 tablet 1   Probiotic CAPS Take 1 capsule by mouth daily.     promethazine (PHENERGAN) 25 MG tablet TAKE ONE TABLET BY MOUTH daily AS NEEDED 30 tablet 3   torsemide (DEMADEX) 20 MG tablet TAKE 4 TABLETS BY MOUTH ONCE DAILY 336 tablet 3   No current facility-administered medications for this visit.

## 2022-07-18 NOTE — Patient Instructions (Addendum)
Medication Instructions:  Your physician recommends that you continue on your current medications as directed. Please refer to the Current Medication list given to you today.  *If you need a refill on your cardiac medications before your next appointment, please call your pharmacy*   Lab Work: NONE If you have labs (blood work) drawn today and your tests are completely normal, you will receive your results only by: [place] (if you have MyChart) OR A paper copy in the mail If you have any lab test that is abnormal or we need to change your treatment, we will call you to review the results.   Testing/Procedures: NONE   Follow-Up: At [facility], you and your health needs are our priority.  As part of our continuing mission to provide you with exceptional heart care, we have created designated Provider Care Teams.  These Care Teams include your primary Cardiologist (physician) and Advanced Practice Providers (APPs -  Physician Assistants and Nurse Practitioners) who all work together to provide you with the care you need, when you need it.  We recommend signing up for the patient portal called "MyChart".  Sign up information is provided on this After Visit Summary.  MyChart is used to connect with patients for Virtual Visits (Telemedicine).  Patients are able to view lab/test results, encounter notes, upcoming appointments, etc.  Non-urgent messages can be sent to your provider as well.   To learn more about what you can do with MyChart, go to [email or web address].    Your next appointment:   FOLLOW UP WITH DR. [name]  Important Information About Sugar

## 2022-07-19 ENCOUNTER — Other Ambulatory Visit: Payer: Self-pay | Admitting: Hematology and Oncology

## 2022-07-19 ENCOUNTER — Inpatient Hospital Stay: Payer: PPO | Attending: Hematology and Oncology | Admitting: Hematology and Oncology

## 2022-07-19 ENCOUNTER — Inpatient Hospital Stay: Payer: PPO

## 2022-07-19 ENCOUNTER — Other Ambulatory Visit: Payer: Self-pay

## 2022-07-19 ENCOUNTER — Encounter: Payer: Self-pay | Admitting: Hematology and Oncology

## 2022-07-19 VITALS — BP 132/58 | HR 73 | Temp 97.9°F | Resp 18 | Ht 64.0 in | Wt 235.4 lb

## 2022-07-19 DIAGNOSIS — D631 Anemia in chronic kidney disease: Secondary | ICD-10-CM | POA: Insufficient documentation

## 2022-07-19 DIAGNOSIS — D72829 Elevated white blood cell count, unspecified: Secondary | ICD-10-CM | POA: Diagnosis not present

## 2022-07-19 DIAGNOSIS — N1832 Chronic kidney disease, stage 3b: Secondary | ICD-10-CM

## 2022-07-19 DIAGNOSIS — Z79899 Other long term (current) drug therapy: Secondary | ICD-10-CM | POA: Insufficient documentation

## 2022-07-19 LAB — CBC WITH DIFFERENTIAL (CANCER CENTER ONLY)
Abs Immature Granulocytes: 0.04 10*3/uL (ref 0.00–0.07)
Basophils Absolute: 0.1 10*3/uL (ref 0.0–0.1)
Basophils Relative: 1 %
Eosinophils Absolute: 0.2 10*3/uL (ref 0.0–0.5)
Eosinophils Relative: 2 %
HCT: 31.6 % — ABNORMAL LOW (ref 36.0–46.0)
Hemoglobin: 9.6 g/dL — ABNORMAL LOW (ref 12.0–15.0)
Immature Granulocytes: 0 %
Lymphocytes Relative: 19 %
Lymphs Abs: 2 10*3/uL (ref 0.7–4.0)
MCH: 26.7 pg (ref 26.0–34.0)
MCHC: 30.4 g/dL (ref 30.0–36.0)
MCV: 87.8 fL (ref 80.0–100.0)
Monocytes Absolute: 0.6 10*3/uL (ref 0.1–1.0)
Monocytes Relative: 6 %
Neutro Abs: 7.8 10*3/uL — ABNORMAL HIGH (ref 1.7–7.7)
Neutrophils Relative %: 72 %
Platelet Count: 281 10*3/uL (ref 150–400)
RBC: 3.6 MIL/uL — ABNORMAL LOW (ref 3.87–5.11)
RDW: 15.9 % — ABNORMAL HIGH (ref 11.5–15.5)
WBC Count: 10.7 10*3/uL — ABNORMAL HIGH (ref 4.0–10.5)
nRBC: 0 % (ref 0.0–0.2)

## 2022-07-19 LAB — CMP (CANCER CENTER ONLY)
ALT: 16 U/L (ref 0–44)
AST: 18 U/L (ref 15–41)
Albumin: 3.4 g/dL — ABNORMAL LOW (ref 3.5–5.0)
Alkaline Phosphatase: 133 U/L — ABNORMAL HIGH (ref 38–126)
Anion gap: 10 (ref 5–15)
BUN: 50 mg/dL — ABNORMAL HIGH (ref 6–20)
CO2: 25 mmol/L (ref 22–32)
Calcium: 8.8 mg/dL — ABNORMAL LOW (ref 8.9–10.3)
Chloride: 104 mmol/L (ref 98–111)
Creatinine: 1.88 mg/dL — ABNORMAL HIGH (ref 0.44–1.00)
GFR, Estimated: 30 mL/min — ABNORMAL LOW (ref >60)
Glucose, Bld: 163 mg/dL — ABNORMAL HIGH (ref 70–99)
Potassium: 4.1 mmol/L (ref 3.5–5.1)
Sodium: 139 mmol/L (ref 135–145)
Total Bilirubin: 0.5 mg/dL (ref 0.3–1.2)
Total Protein: 7.5 g/dL (ref 6.5–8.1)

## 2022-07-19 LAB — IRON AND TIBC
Iron: 35 ug/dL (ref 28–170)
Saturation Ratios: 13 % (ref 10.4–31.8)
TIBC: 262 ug/dL (ref 250–450)
UIBC: 227 ug/dL

## 2022-07-19 LAB — FERRITIN: Ferritin: 108 ng/mL (ref 11–307)

## 2022-07-19 LAB — FOLATE: Folate: 9.7 ng/mL (ref >5.9)

## 2022-07-19 LAB — VITAMIN B12: Vitamin B-12: 345 pg/mL (ref 180–914)

## 2022-07-22 ENCOUNTER — Other Ambulatory Visit: Payer: Self-pay | Admitting: Family Medicine

## 2022-07-24 DIAGNOSIS — N183 Chronic kidney disease, stage 3 unspecified: Secondary | ICD-10-CM | POA: Diagnosis not present

## 2022-07-24 DIAGNOSIS — Z89422 Acquired absence of other left toe(s): Secondary | ICD-10-CM | POA: Diagnosis not present

## 2022-07-24 DIAGNOSIS — E1122 Type 2 diabetes mellitus with diabetic chronic kidney disease: Secondary | ICD-10-CM | POA: Diagnosis not present

## 2022-07-24 DIAGNOSIS — E113591 Type 2 diabetes mellitus with proliferative diabetic retinopathy without macular edema, right eye: Secondary | ICD-10-CM | POA: Diagnosis not present

## 2022-07-24 DIAGNOSIS — Z515 Encounter for palliative care: Secondary | ICD-10-CM | POA: Diagnosis not present

## 2022-07-24 DIAGNOSIS — M059 Rheumatoid arthritis with rheumatoid factor, unspecified: Secondary | ICD-10-CM | POA: Diagnosis not present

## 2022-07-24 DIAGNOSIS — F411 Generalized anxiety disorder: Secondary | ICD-10-CM | POA: Diagnosis not present

## 2022-07-24 DIAGNOSIS — I69354 Hemiplegia and hemiparesis following cerebral infarction affecting left non-dominant side: Secondary | ICD-10-CM | POA: Diagnosis not present

## 2022-07-24 DIAGNOSIS — Z6839 Body mass index (BMI) 39.0-39.9, adult: Secondary | ICD-10-CM | POA: Diagnosis not present

## 2022-07-24 DIAGNOSIS — Z794 Long term (current) use of insulin: Secondary | ICD-10-CM | POA: Diagnosis not present

## 2022-07-24 DIAGNOSIS — I509 Heart failure, unspecified: Secondary | ICD-10-CM | POA: Diagnosis not present

## 2022-07-24 DIAGNOSIS — E1140 Type 2 diabetes mellitus with diabetic neuropathy, unspecified: Secondary | ICD-10-CM | POA: Diagnosis not present

## 2022-07-25 ENCOUNTER — Telehealth: Payer: PPO

## 2022-07-25 ENCOUNTER — Ambulatory Visit (INDEPENDENT_AMBULATORY_CARE_PROVIDER_SITE_OTHER): Payer: PPO

## 2022-07-25 DIAGNOSIS — F411 Generalized anxiety disorder: Secondary | ICD-10-CM

## 2022-07-25 DIAGNOSIS — I130 Hypertensive heart and chronic kidney disease with heart failure and stage 1 through stage 4 chronic kidney disease, or unspecified chronic kidney disease: Secondary | ICD-10-CM

## 2022-07-25 DIAGNOSIS — F439 Reaction to severe stress, unspecified: Secondary | ICD-10-CM

## 2022-07-25 DIAGNOSIS — F330 Major depressive disorder, recurrent, mild: Secondary | ICD-10-CM

## 2022-07-25 DIAGNOSIS — E1121 Type 2 diabetes mellitus with diabetic nephropathy: Secondary | ICD-10-CM

## 2022-07-25 NOTE — Chronic Care Management (AMB) (Signed)
Chronic Care Management   CCM RN Visit Note  07/25/2022 Name: Dana [name] MRN: [id number] DOB: [birth year]  Subjective: Dana [name] is a 62 y.o. year old female who is a primary care patient of Cox, Kirsten, MD. The patient was referred to the Chronic Care Management team for assistance with care management needs subsequent to provider initiation of CCM services and plan of care.    Today's Visit:  Engaged with patient by telephone for initial visit.     SDOH Interventions Today    Flowsheet Row Most Recent Value  SDOH Interventions   Food Insecurity Interventions Other (Comment), AMB Referral  [care guide referral pending for assistance for resources in the community]  Housing Interventions Intervention Not Indicated  Transportation Interventions Intervention Not Indicated  Utilities Interventions AMB Referral, Other (Comment)  [the patients husband lost his job in August of last year and dthey need help with utilities, referral for care guide assistance for resources]  Alcohol Usage Interventions Intervention Not Indicated (Score <7)  Financial Strain Interventions Other (Comment)  [the patient is concerned as her husband has lost his job and they have applied for food stamps, pending referral for LCSW and care guides for resources]  Stress Interventions Other (Comment), Provide Counseling  [referrals to LCSW and also care guides for resources to help with stress and anxiety about financial burdens due to husband being out of work currently]  Social Connections Interventions Intervention Not Indicated, Other (Comment)  [has a good support system in place with her family and friends]         Goals Addressed             This Visit's Progress    CCM Expected Outcome:  Monitor, Self-Manage and Reduce Symptoms of Diabetes       Current Barriers:  Chronic Disease Management support and education needs related to effective management of DM  Planned Interventions: Provided  education to patient about basic DM disease process; Reviewed medications with patient and discussed importance of medication adherence;        Reviewed prescribed diet with patient heart healthy/ADA diet ; Counseled on importance of regular laboratory monitoring as prescribed;        Discussed plans with patient for ongoing care management follow up and provided patient with direct contact information for care management team;      Provided patient with written educational materials related to hypo and hyperglycemia and importance of correct treatment;       Reviewed scheduled/upcoming provider appointments including: 07-31-2022 at 730 am;         Advised patient, providing education and rationale, to check cbg twice daily and when you have symptoms of low or high blood sugar and record        call provider for findings outside established parameters;       Referral made to community resources care guide team for assistance with food insecurities and resources in her area to help with expressed needs. ;      Review of patient status, including review of consultants reports, relevant laboratory and other test results, and medications completed;       Advised patient to discuss changes in DM, questions, and concerns with provider;      Screening for signs and symptoms of depression related to chronic disease state;        Assessed social determinant of health barriers;         Symptom Management: Take medications as prescribed  Attend all scheduled provider appointments Call provider office for new concerns or questions  call the Suicide and Crisis Lifeline: 988 call the [place] National Suicide Prevention Lifeline: 463-713-1978 or TTY: 4073271434 TTY 719-187-1221) to talk to a trained counselor call 1-800-273-TALK (toll free, 24 hour hotline) if experiencing a Mental Health or [place]  keep appointment with eye doctor check feet daily for cuts, sores or redness trim  toenails straight across manage portion size wash and dry feet carefully every day wear comfortable, cotton socks wear comfortable, well-fitting shoes  Follow Up Plan: Telephone follow up appointment with care management team member scheduled for: 09-26-2022 at 230 pm       CCM Expected Outcome:  Monitor, Self-Manage and Reduce Symptoms of: Depression, Anxiety, Stress       Current Barriers:  Knowledge Deficits related to resources available to help with expressed needs at this time due to her husband losing his job and his unemployment has expired Care Coordination needs related to resources to help meet financial needs at this time in a patient with stress, anxiety, and depression  Chronic Disease Management support and education needs related to effective management of stress, anxiety, and depression Financial Constraints.   Planned Interventions: Evaluation of current treatment plan related to depression, stress, and anxiety and patient's adherence to plan as established by provider Advised patient to call [place] at [facility] for assistance with help getting her husbands medication due to being unemployed with no insurance. [phone] and ask for Langley Adie Provided education to patient re: resources and help available to assist the patient with needs at this time due to her husband being unemployed Reviewed medications with patient and discussed compliance. The patient is compliant with medications and works with the pharm D for her medication needs. Denies any issues with non-compliance Collaborated with pharm D regarding Community Pharmacy assistance for the patients husband due to no insurance and being unemployed. Provided information to the patient by a return call to the patient.  Provided patient with care guide and SW support as well as support from the interdisciplinary team for management of stress, anxiety, and depression  educational materials related to effective  management of stress, anxiety, and depression Reviewed scheduled/upcoming provider appointments including 07-31-2022 at 730 am Care Guide referral for community resources for [facility] for food insecurities and assistance with utilities  Social Work referral for education and support and resources for effective management of stress at this time as her husband is out of work and she is concerned about financial issues Discussed plans with patient for ongoing care management follow up and provided patient with direct contact information for care management team Advised patient to discuss changes in mood, increased anxiety, depression, stress, or other mental health needs.  with provider Screening for signs and symptoms of depression related to chronic disease state  Assessed social determinant of health barriers  Symptom Management: Take medications as prescribed   Attend all scheduled provider appointments Call provider office for new concerns or questions  call the Suicide and Crisis Lifeline: 988 call the [place] National Suicide Prevention Lifeline: (620) 838-8969 or TTY: (343) 125-8890 TTY (857)581-8988) to talk to a trained counselor call 1-800-273-TALK (toll free, 24 hour hotline) if experiencing a Mental Health or [place]   Follow Up Plan: Telephone follow up appointment with care management team member scheduled for: 09-26-2022 at 230 pm       CCM Expected Outcome:  Monitor, Self-Manage, and Reduce Symptoms of Hypertension  Current Barriers:  Chronic Disease Management support and education needs related to effective management of HTN BP Readings from Last 3 Encounters:  07/19/22 (!) 132/58  07/18/22 122/62  07/12/22 (!) 141/57     Planned Interventions: Evaluation of current treatment plan related to hypertension self management and patient's adherence to plan as established by provider. The patient states her blood pressures are stable at this time. She  does take at home and records. Sees specialist on a regular basis. ;   Provided education to patient re: stroke prevention, s/s of heart attack and stroke; Reviewed prescribed diet heart healthy/ADA diet  Reviewed medications with patient and discussed importance of compliance;  Discussed plans with patient for ongoing care management follow up and provided patient with direct contact information for care management team; Advised patient, providing education and rationale, to monitor blood pressure daily and record, calling PCP for findings outside established parameters. The patient states her blood pressures systolic are around 160'V and diastolic about 63 to 66;  Reviewed scheduled/upcoming provider appointments including: 07-31-2022 at 730 am Advised patient to discuss changes in her blood pressures or heart health  with provider; Provided education on prescribed diet heart healthy/ADA diet. The patient is working with DSS currently hopeful to get food stamps due to her husband being out of work;  Discussed complications of poorly controlled blood pressure such as heart disease, stroke, circulatory complications, vision complications, kidney impairment, sexual dysfunction;  Screening for signs and symptoms of depression related to chronic disease state;  Assessed social determinant of health barriers;   Symptom Management: Take medications as prescribed   Attend all scheduled provider appointments Call provider office for new concerns or questions  call the Suicide and Crisis Lifeline: 988 call the [place] National Suicide Prevention Lifeline: (450)385-0153 or TTY: 331-213-9992 TTY 267 204 8204) to talk to a trained counselor call 1-800-273-TALK (toll free, 24 hour hotline) if experiencing a Mental Health or [place]  check blood pressure 3 times per week write blood pressure results in a log or diary learn about high blood pressure keep a blood pressure log take blood  pressure log to all doctor appointments call doctor for signs and symptoms of high blood pressure develop an action plan for high blood pressure keep all doctor appointments take medications for blood pressure exactly as prescribed report new symptoms to your doctor  Follow Up Plan: Telephone follow up appointment with care management team member scheduled for: 09-26-2022 at 230 pm          Plan:Telephone follow up appointment with care management team member scheduled for:  09-26-2022 at 230 pm  [place], MSN, CCM RN Care Manager  Chronic Care Management Direct Number: [phone]

## 2022-07-25 NOTE — Patient Instructions (Signed)
Please call the care guide team at [phone] if you need to cancel or reschedule your appointment.   If you are experiencing a Mental Health or [place] or need someone to talk to, please call the Suicide and Crisis Lifeline: 988 call the [place] National Suicide Prevention Lifeline: 4142452996 or TTY: 956-852-1650 TTY [phone]) to talk to a trained counselor call 1-800-273-TALK (toll free, 24 hour hotline)   Following is a copy of the CCM Program Consent:  CCM service includes personalized support from designated clinical staff supervised by the physician, including individualized plan of care and coordination with other care providers 24/7 contact phone numbers for assistance for urgent and routine care needs. Service will only be billed when office clinical staff spend 20 minutes or more in a month to coordinate care. Only one practitioner may furnish and bill the service in a calendar month. The patient may stop CCM services at amy time (effective at the end of the month) by phone call to the office staff. The patient will be responsible for cost sharing (co-pay) or up to 20% of the service fee (after annual deductible is met)  Following is a copy of your full provider care plan:   Goals Addressed             This Visit's Progress    CCM Expected Outcome:  Monitor, Self-Manage and Reduce Symptoms of Diabetes       Current Barriers:  Chronic Disease Management support and education needs related to effective management of DM  Planned Interventions: Provided education to patient about basic DM disease process; Reviewed medications with patient and discussed importance of medication adherence;        Reviewed prescribed diet with patient heart healthy/ADA diet ; Counseled on importance of regular laboratory monitoring as prescribed;        Discussed plans with patient for ongoing care management follow up and provided patient with direct contact information  for care management team;      Provided patient with written educational materials related to hypo and hyperglycemia and importance of correct treatment;       Reviewed scheduled/upcoming provider appointments including: 07-31-2022 at 730 am;         Advised patient, providing education and rationale, to check cbg twice daily and when you have symptoms of low or high blood sugar and record        call provider for findings outside established parameters;       Referral made to community resources care guide team for assistance with food insecurities and resources in her area to help with expressed needs. ;      Review of patient status, including review of consultants reports, relevant laboratory and other test results, and medications completed;       Advised patient to discuss changes in DM, questions, and concerns with provider;      Screening for signs and symptoms of depression related to chronic disease state;        Assessed social determinant of health barriers;         Symptom Management: Take medications as prescribed   Attend all scheduled provider appointments Call provider office for new concerns or questions  call the Suicide and Crisis Lifeline: 988 call the [place] National Suicide Prevention Lifeline: 469-617-5709 or TTY: (409)710-0563 TTY [phone]) to talk to a trained counselor call 1-800-273-TALK (toll free, 24 hour hotline) if experiencing a Mental Health or [place]  keep appointment with eye doctor check  feet daily for cuts, sores or redness trim toenails straight across manage portion size wash and dry feet carefully every day wear comfortable, cotton socks wear comfortable, well-fitting shoes  Follow Up Plan: Telephone follow up appointment with care management team member scheduled for: 09-26-2022 at 230 pm       CCM Expected Outcome:  Monitor, Self-Manage and Reduce Symptoms of: Depression, Anxiety, Stress       Current Barriers:  Knowledge  Deficits related to resources available to help with expressed needs at this time due to her husband losing his job and his unemployment has expired Care Coordination needs related to resources to help meet financial needs at this time in a patient with stress, anxiety, and depression  Chronic Disease Management support and education needs related to effective management of stress, anxiety, and depression Financial Constraints.   Planned Interventions: Evaluation of current treatment plan related to depression, stress, and anxiety and patient's adherence to plan as established by provider Advised patient to call [place] at [facility] for assistance with help getting her husbands medication due to being unemployed with no insurance. [phone] and ask for Langley Adie Provided education to patient re: resources and help available to assist the patient with needs at this time due to her husband being unemployed Reviewed medications with patient and discussed compliance. The patient is compliant with medications and works with the pharm D for her medication needs. Denies any issues with non-compliance Collaborated with pharm D regarding Community Pharmacy assistance for the patients husband due to no insurance and being unemployed. Provided information to the patient by a return call to the patient.  Provided patient with care guide and SW support as well as support from the interdisciplinary team for management of stress, anxiety, and depression  educational materials related to effective management of stress, anxiety, and depression Reviewed scheduled/upcoming provider appointments including 07-31-2022 at 730 am Care Guide referral for community resources for [facility] for food insecurities and assistance with utilities  Social Work referral for education and support and resources for effective management of stress at this time as her husband is out of work and she is concerned about  financial issues Discussed plans with patient for ongoing care management follow up and provided patient with direct contact information for care management team Advised patient to discuss changes in mood, increased anxiety, depression, stress, or other mental health needs.  with provider Screening for signs and symptoms of depression related to chronic disease state  Assessed social determinant of health barriers  Symptom Management: Take medications as prescribed   Attend all scheduled provider appointments Call provider office for new concerns or questions  call the Suicide and Crisis Lifeline: 988 call the [place] National Suicide Prevention Lifeline: (857)035-7955 or TTY: (585) 331-0489 TTY 207-309-9949) to talk to a trained counselor call 1-800-273-TALK (toll free, 24 hour hotline) if experiencing a Mental Health or [place]   Follow Up Plan: Telephone follow up appointment with care management team member scheduled for: 09-26-2022 at 230 pm       CCM Expected Outcome:  Monitor, Self-Manage, and Reduce Symptoms of Hypertension       Current Barriers:  Chronic Disease Management support and education needs related to effective management of HTN BP Readings from Last 3 Encounters:  07/19/22 (!) 132/58  07/18/22 122/62  07/12/22 (!) 141/57     Planned Interventions: Evaluation of current treatment plan related to hypertension self management and patient's adherence to plan as established by provider.  The patient states her blood pressures are stable at this time. She does take at home and records. Sees specialist on a regular basis. ;   Provided education to patient re: stroke prevention, s/s of heart attack and stroke; Reviewed prescribed diet heart healthy/ADA diet  Reviewed medications with patient and discussed importance of compliance;  Discussed plans with patient for ongoing care management follow up and provided patient with direct contact information for care  management team; Advised patient, providing education and rationale, to monitor blood pressure daily and record, calling PCP for findings outside established parameters. The patient states her blood pressures systolic are around 710'G and diastolic about 63 to 66;  Reviewed scheduled/upcoming provider appointments including: 07-31-2022 at 730 am Advised patient to discuss changes in her blood pressures or heart health  with provider; Provided education on prescribed diet heart healthy/ADA diet. The patient is working with DSS currently hopeful to get food stamps due to her husband being out of work;  Discussed complications of poorly controlled blood pressure such as heart disease, stroke, circulatory complications, vision complications, kidney impairment, sexual dysfunction;  Screening for signs and symptoms of depression related to chronic disease state;  Assessed social determinant of health barriers;   Symptom Management: Take medications as prescribed   Attend all scheduled provider appointments Call provider office for new concerns or questions  call the Suicide and Crisis Lifeline: 988 call the [place] National Suicide Prevention Lifeline: 902 279 9217 or TTY: 912-197-3466 TTY 607-262-3105) to talk to a trained counselor call 1-800-273-TALK (toll free, 24 hour hotline) if experiencing a Mental Health or [place]  check blood pressure 3 times per week write blood pressure results in a log or diary learn about high blood pressure keep a blood pressure log take blood pressure log to all doctor appointments call doctor for signs and symptoms of high blood pressure develop an action plan for high blood pressure keep all doctor appointments take medications for blood pressure exactly as prescribed report new symptoms to your doctor  Follow Up Plan: Telephone follow up appointment with care management team member scheduled for: 09-26-2022 at 230 pm          Patient  verbalizes understanding of instructions and care plan provided today and agrees to view in [place]. Active MyChart status and patient understanding of how to access instructions and care plan via MyChart confirmed with patient.     Telephone follow up appointment with care management team member scheduled for: 09-26-2022 at 230 pm

## 2022-07-25 NOTE — Plan of Care (Signed)
Chronic Care Management Provider Comprehensive Care Plan    07/25/2022 Name: Dana [name] MRN: [id number] DOB: Oct 19, 1960  Referral to Chronic Care Management (CCM) services was placed by Provider:  Dr. [name]  on Date: 07-17-2022.  Chronic Condition 1: HTN Provider Assessment and Plan  Well controlled.  No changes to medicines. Carvedilol 12.5 mg three times daily.hydralazine 25 mg three times a day, imdur 30 mg daily, torsemide 20 mg 4 daily, potassium 20 meq once daily, plavix 75 mg q, Continue to work on eating a healthy diet and exercise.  Labs drawn today.            Relevant Orders    CBC with Differential/Platelet (Completed)    Comprehensive metabolic panel (Completed)    Lipid panel (Completed)     Expected Outcome/Goals Addressed This Visit (Provider CCM goals/Provider Assessment and plan   CCM (HYPERTENSION)  EXPECTED OUTCOME:  MONITOR,SELF- MANAGE AND REDUCE SYMPTOMS OF HYPERTENSION   Symptom Management Condition 1: Take all medications as prescribed Attend all scheduled provider appointments Call provider office for new concerns or questions  call the Suicide and Crisis Lifeline: 988 call the [place] National Suicide Prevention Lifeline: 404-516-0211 or TTY: (715)453-7846 [place] [phone]) to talk to a trained counselor call 1-800-273-TALK (toll free, 24 hour hotline) if experiencing a Mental Health or [place]  check blood pressure 3 times per week write blood pressure results in a log or diary learn about high blood pressure keep a blood pressure log take blood pressure log to all doctor appointments call doctor for signs and symptoms of high blood pressure keep all doctor appointments take medications for blood pressure exactly as prescribed report new symptoms to your doctor  Chronic Condition 2: DM Provider Assessment and Plan Control: good Recommend check sugars fasting daily. Recommend check feet daily. Recommend annual eye  exams. Medicines: Levermir 50 units daily, Novolog Ssi before meals.  Continue to work on eating a healthy diet and exercise.          Expected Outcome/Goals Addressed This Visit (Provider CCM goals/Provider Assessment and plan   CCM (Diabetes)  EXPECTED OUTCOME:  MONITOR,SELF- MANAGE AND REDUCE SYMPTOMS OF Diabetes  Symptom Management Condition 2: Take all medications as prescribed Attend all scheduled provider appointments Call provider office for new concerns or questions  call the Suicide and Crisis Lifeline: 988 call the [place] National Suicide Prevention Lifeline: 620-239-4955 or TTY: (321)588-7345 TTY 831-330-3083) to talk to a trained counselor call 1-800-273-TALK (toll free, 24 hour hotline) if experiencing a Mental Health or [place]  keep appointment with eye doctor check feet daily for cuts, sores or redness trim toenails straight across manage portion size wash and dry feet carefully every day wear comfortable, cotton socks wear comfortable, well-fitting shoes  Chronic Condition 3: Depression, stress, anxiety Provider Assessment and Plan buspar 5 mg three times a day and lorazepam 0.5 mg once daily,    Expected Outcome/Goals Addressed This Visit (Provider CCM goals/Provider Assessment and plan   CCM Stress/Anxiety/Depression  EXPECTED OUTCOME:  MONITOR,SELF- MANAGE AND REDUCE SYMPTOMS OF Stress/Anxiety/Depression    Symptom Management Condition 3: Take all medications as prescribed Attend all scheduled provider appointments Call provider office for new concerns or questions  call the Suicide and Crisis Lifeline: 988 call the [place] National Suicide Prevention Lifeline: 628-495-2775 or TTY: 339 870 8122 TTY [phone]) to talk to a trained counselor call 1-800-273-TALK (toll free, 24 hour hotline) if experiencing a Mental Health or Behavioral Health Crisis  Problem List  Patient Active Problem List   Diagnosis Date Noted   Leukocytosis  03/29/2022   Genetic testing 11/07/2021   Family history of breast cancer 10/23/2021   GAD (generalized anxiety disorder) 07/11/2021   Mixed hyperlipidemia 07/11/2021   S/P mitral valve clip implantation 05/18/2021   Anemia due to stage 3b chronic kidney disease ([place]) 04/04/2021   History of cardiac arrest 02/06/2021   Bilateral lower extremity edema 01/23/2021   Anoxic brain injury ([place]) 01/20/2021   Impaired mobility and ADLs 01/20/2021   Left-sided weakness 01/20/2021   Drug-induced myopathy 01/03/2021   Secondary hyperparathyroidism of renal origin ([place]) 11/18/2020   Vitamin D insufficiency 11/18/2020   Contrast dye induced nephropathy, possible 09/03/2020   OSA on CPAP 09/03/2020   Need for COVID-19 vaccine 06/28/2020   Medicare annual wellness visit, subsequent 06/13/2020   Chronic respiratory failure with hypoxia ([place]) 03/23/2020   Chronic gout without tophus 03/23/2020   Pain in both hands 58/52/7782   Uncomplicated opioid dependence ([place]) 03/23/2020   Depression, major, recurrent, mild ([place]) 12/21/2019   Hypomagnesemia 12/21/2019   Diarrhea 12/21/2019   Lumbar back pain 10/05/2019   Diabetic nephropathy associated with type 2 diabetes mellitus ([place]) 10/05/2019   Hypertensive heart and renal disease with congestive heart failure ([place]) 09/18/2019   Demand ischemia    Diastolic heart failure ([place]) 11/07/2017   Class 2 severe obesity due to excess calories with serious comorbidity and body mass index (BMI) of 39.0 to 39.9 in adult [facility]) 11/07/2017   Coronary artery disease of native artery of native heart with stable angina pectoris ([place]) 12/25/2016   Hyperlipidemia LDL goal <70 12/14/2016   Anemia 03/23/2016   Chronic pain syndrome 03/23/2016   GERD without esophagitis 03/23/2016   On home oxygen therapy 03/23/2016   S/P ablation of atrial fibrillation 10/04/2015   Diabetic polyneuropathy associated with type 2 diabetes mellitus ([place]) 04/02/2014    Medication  Management  Current Outpatient Medications:    allopurinol (ZYLOPRIM) 300 MG tablet, Take 1 tablet (300 mg total) by mouth daily., Disp: 90 tablet, Rfl: 1   busPIRone (BUSPAR) 5 MG tablet, Take 1 tablet (5 mg total) by mouth 3 (three) times daily., Disp: 270 tablet, Rfl: 1   carvedilol (COREG) 12.5 MG tablet, TAKE ONE TABLET BY MOUTH TWICE DAILY, Disp: 180 tablet, Rfl: 3   clopidogrel (PLAVIX) 75 MG tablet, Take 1 tablet (75 mg total) by mouth daily., Disp: 90 tablet, Rfl: 1   Continuous Blood Gluc Sensor (FREESTYLE LIBRE 2 SENSOR) MISC, USE TO check blood glucose AS DIRECTED AND CHANGE sensor every 14 DAYS, Disp: , Rfl:    EPINEPHrine 0.3 mg/0.3 mL IJ SOAJ injection, Use as directed for life-threatening allergic reaction., Disp: 4 Device, Rfl: 3   Evolocumab (REPATHA SURECLICK) 423 MG/ML SOAJ, Inject 1 Dose into the skin every 14 (fourteen) days., Disp: 6 mL, Rfl: 1   ezetimibe (ZETIA) 10 MG tablet, Take 1 tablet (10 mg total) by mouth daily., Disp: 90 tablet, Rfl: 3   hydrALAZINE (APRESOLINE) 25 MG tablet, Take 1 tablet (25 mg total) by mouth 3 (three) times daily., Disp: 90 tablet, Rfl: 6   HYDROcodone-acetaminophen (NORCO) 7.5-325 MG tablet, TAKE ONE TABLET BY MOUTH THREE TIMES DAILY AS NEEDED FOR moderate pain, Disp: 90 tablet, Rfl: 0   insulin aspart (NOVOLOG FLEXPEN) 100 UNIT/ML FlexPen, 2-10 U before meals based on SSI., Disp: 15 mL, Rfl: 3   Insulin Pen Needle (BD PEN NEEDLE NANO 2ND GEN) 32G X 4 MM MISC, Inject 1  each into the skin in the morning, at noon, in the evening, and at bedtime., Disp: 600 each, Rfl: 3   isosorbide mononitrate (IMDUR) 30 MG 24 hr tablet, Take 1 tablet (30 mg total) by mouth daily., Disp: 90 tablet, Rfl: 1   lactulose (CHRONULAC) 10 GM/15ML solution, Take 15 mLs (10 g total) by mouth daily as needed for mild constipation., Disp: 236 mL, Rfl: 1   latanoprost (XALATAN) 0.005 % ophthalmic solution, Place 1 drop into both eyes at bedtime., Disp: , Rfl:    LEVEMIR  FLEXPEN 100 UNIT/ML FlexPen, INJECT 50 units into THE SKIN DAILY, Disp: 15 mL, Rfl: 1   LORazepam (ATIVAN) 0.5 MG tablet, TAKE ONE TABLET BY MOUTH ONCE daily AS NEEDED FOR ANXIETY, Disp: 30 tablet, Rfl: 2   magnesium oxide (MAG-OX) 400 MG tablet, Take 2 tablets (800 mg total) by mouth daily. Take 2 (400 mg) daily=800 mg, Disp: 60 tablet, Rfl: 1   nitrofurantoin (MACRODANTIN) 50 MG capsule, Take 50 mg by mouth at bedtime., Disp: , Rfl:    nitroGLYCERIN (NITROSTAT) 0.4 MG SL tablet, Dissolve 1 tab under tongue as needed for chest pain. May repeat every 5 minutes x 2 doses. If no relief call 9-1-1. Please call [phone] to schedule an appointment for future refills. Thank you., Disp: 50 tablet, Rfl: 0   Omega-3 Fatty Acids (FISH OIL PO), Take 1 capsule by mouth daily., Disp: , Rfl:    OXYGEN, Inhale 2 L into the lungs at bedtime., Disp: , Rfl:    pantoprazole (PROTONIX) 40 MG tablet, Take 1 tablet (40 mg total) by mouth daily., Disp: 90 tablet, Rfl: 1   Potassium Chloride ER 20 MEQ TBCR, Take 1 tablet by mouth daily., Disp: 90 tablet, Rfl: 1   potassium chloride SA (KLOR-CON M) 20 MEQ tablet, TAKE ONE TABLET BY MOUTH ONCE daily, Disp: 90 tablet, Rfl: 1   Probiotic CAPS, Take 1 capsule by mouth daily., Disp: , Rfl:    promethazine (PHENERGAN) 25 MG tablet, TAKE ONE TABLET BY MOUTH daily AS NEEDED, Disp: 30 tablet, Rfl: 3   torsemide (DEMADEX) 20 MG tablet, TAKE 4 TABLETS BY MOUTH ONCE DAILY, Disp: 336 tablet, Rfl: 3  Cognitive Assessment Identity Confirmed: : Name; DOB Cognitive Status: Normal   Functional Assessment Hearing Difficulty or Deaf: no Wear Glasses or Blind: yes Vision Management: wears glasses Concentrating, Remembering or Making Decisions Difficulty (CP): no Difficulty Communicating: no Difficulty Eating/Swallowing: no Walking or Climbing Stairs Difficulty: yes Walking or Climbing Stairs: ambulation difficulty, requires equipment Mobility Management: The patient uses a cane  99% of the time and has walker if she is weak Dressing/Bathing Difficulty: no Doing Errands Independently Difficulty (such as shopping) (CP): yes Errands Management: depends on her husband to do her errands for her. She easily gets short of breath Change in Functional Status Since Onset of Current Illness/Injury: no   Caregiver Assessment  Primary Source of Support/Comfort: spouse Name of Support/Comfort Primary Source: Esmerelda Finnigan- husband People in Home: spouse Name(s) of People in Home: [name]- husband Family Caregiver if Needed: spouse Family Caregiver Names: spouse [name] assist as needed Primary Roles/Responsibilities: disabled; retired Expected Impact of Illness/Hospitalization: the patient is under a lot of stress right now due to her husband being unemployed, is open to resources to help during this time.   Planned Interventions  Provided education to patient about basic DM disease process; Reviewed medications with patient and discussed importance of medication adherence;        Reviewed prescribed diet  with patient heart healthy/ADA diet ; Counseled on importance of regular laboratory monitoring as prescribed;        Discussed plans with patient for ongoing care management follow up and provided patient with direct contact information for care management team;      Provided patient with written educational materials related to hypo and hyperglycemia and importance of correct treatment;       Reviewed scheduled/upcoming provider appointments including: 07-31-2022 at 730 am;         Advised patient, providing education and rationale, to check cbg twice daily and when you have symptoms of low or high blood sugar and record        call provider for findings outside established parameters;       Referral made to community resources care guide team for assistance with food insecurities and resources in her area to help with expressed needs. ;      Review of patient status, including review  of consultants reports, relevant laboratory and other test results, and medications completed;       Advised patient to discuss changes in DM, questions, and concerns with provider;      Screening for signs and symptoms of depression related to chronic disease state;        Assessed social determinant of health barriers;        Evaluation of current treatment plan related to depression, stress, and anxiety and patient's adherence to plan as established by provider Advised patient to call [place] [facility] for assistance with help getting her husbands medication due to being unemployed with no insurance. [phone] and ask for [place] Adie Provided education to patient re: resources and help available to assist the patient with needs at this time due to her husband being unemployed Reviewed medications with patient and discussed compliance. The patient is compliant with medications and works with the pharm D for her medication needs. Denies any issues with non-compliance Collaborated with pharm D regarding Community Pharmacy assistance for the patients husband due to no insurance and being unemployed. Provided information to the patient by a return call to the patient.  Provided patient with care guide and SW support as well as support from the interdisciplinary team for management of stress, anxiety, and depression  educational materials related to effective management of stress, anxiety, and depression Reviewed scheduled/upcoming provider appointments including 07-31-2022 at 730 am Care Guide referral for community resources for [facility] for food insecurities and assistance with utilities  Social Work referral for education and support and resources for effective management of stress at this time as her husband is out of work and she is concerned about financial issues Discussed plans with patient for ongoing care management follow up and provided patient with direct contact  information for care management team Advised patient to discuss changes in mood, increased anxiety, depression, stress, or other mental health needs.  with provider Screening for signs and symptoms of depression related to chronic disease state  Assessed social determinant of health barriers Evaluation of current treatment plan related to hypertension self management and patient's adherence to plan as established by provider. The patient states her blood pressures are stable at this time. She does take at home and records. Sees specialist on a regular basis. ;   Provided education to patient re: stroke prevention, s/s of heart attack and stroke; Reviewed prescribed diet heart healthy/ADA diet  Reviewed medications with patient and discussed importance of compliance;  Discussed plans with patient  for ongoing care management follow up and provided patient with direct contact information for care management team; Advised patient, providing education and rationale, to monitor blood pressure daily and record, calling PCP for findings outside established parameters. The patient states her blood pressures systolic are around 190'V and diastolic about 63 to 66;  Reviewed scheduled/upcoming provider appointments including: 07-31-2022 at 730 am Advised patient to discuss changes in her blood pressures or heart health  with provider; Provided education on prescribed diet heart healthy/ADA diet. The patient is working with DSS currently hopeful to get food stamps due to her husband being out of work;  Discussed complications of poorly controlled blood pressure such as heart disease, stroke, circulatory complications, vision complications, kidney impairment, sexual dysfunction;  Screening for signs and symptoms of depression related to chronic disease state;  Assessed social determinant of health barriers;    Interaction and coordination with outside resources, practitioners, and providers See CCM  Referral  Care Plan: Available in MyChart

## 2022-07-26 ENCOUNTER — Telehealth: Payer: Self-pay

## 2022-07-26 ENCOUNTER — Telehealth: Payer: Self-pay | Admitting: *Deleted

## 2022-07-26 ENCOUNTER — Inpatient Hospital Stay: Payer: PPO

## 2022-07-26 VITALS — BP 140/59 | HR 81 | Resp 20 | Ht 64.0 in | Wt 238.8 lb

## 2022-07-26 DIAGNOSIS — D631 Anemia in chronic kidney disease: Secondary | ICD-10-CM

## 2022-07-26 DIAGNOSIS — N1832 Chronic kidney disease, stage 3b: Secondary | ICD-10-CM | POA: Diagnosis not present

## 2022-07-26 MED ORDER — EPOETIN ALFA-EPBX 20000 UNIT/ML IJ SOLN
20000.0000 [IU] | Freq: Once | INTRAMUSCULAR | Status: AC
  Administered 2022-07-26: 20000 [IU] via SUBCUTANEOUS
  Filled 2022-07-26: qty 1

## 2022-07-26 NOTE — Progress Notes (Signed)
  Care Coordination   Note   07/26/2022 Name: [name] MRN: [id number] DOB: Aug 29, 1960  [name] is a 62 y.o. year old female who sees Cox, Kirsten, MD for primary care. I reached out to [name] by phone today to offer care coordination services.  Ms. [name] was given information about Care Coordination services today including:   The Care Coordination services include support from the care team which includes your Nurse Coordinator, Clinical Social Worker, or Pharmacist.  The Care Coordination team is here to help remove barriers to the health concerns and goals most important to you. Care Coordination services are voluntary, and the patient may decline or stop services at any time by request to their care team member.   Care Coordination Consent Status: Patient agreed to services and verbal consent obtained.   Follow up plan:  Telephone appointment with care coordination team member scheduled for:  08/01/2022  Encounter Outcome:  Pt. Scheduled   Julian Hy, [place] Direct Dial: [phone]

## 2022-07-26 NOTE — Patient Instructions (Signed)

## 2022-07-26 NOTE — Telephone Encounter (Signed)
   Telephone encounter was:  Successful.  07/26/2022 Name: [name] MRN: [id number] DOB: [birth year]  Dana [name] is a 62 y.o. year old female who is a primary care patient of Cox, Kirsten, MD . The community resource team was consulted for assistance with Food Insecurity and Financial Difficulties related to Financial Strain  Care guide performed the following interventions: Patient provided with information about care guide support team and interviewed to confirm resource needs.Patient is having financial strain due to her husband being laid off and unemployment has run out. She only gets her disability check and has a difficult time paying bills and buying food. Patients husband has found a job but wont be able to start until June I added a referral in [place] and mailed resources to patient as requested   Follow Up Plan:  No further follow up planned at this time. The patient has been provided with needed resources.    [place], Care Management  [phone] [address], [place],  [address] Phone: [phone] Email: [email or web address]

## 2022-07-30 ENCOUNTER — Other Ambulatory Visit: Payer: Self-pay | Admitting: Family Medicine

## 2022-07-31 ENCOUNTER — Ambulatory Visit: Payer: HMO | Admitting: Family Medicine

## 2022-07-31 ENCOUNTER — Other Ambulatory Visit: Payer: Self-pay | Admitting: Family Medicine

## 2022-08-01 ENCOUNTER — Ambulatory Visit: Payer: Self-pay | Admitting: Licensed Clinical Social Worker

## 2022-08-01 NOTE — Patient Instructions (Signed)
Visit Information  Thank you for taking time to visit with me today. Please don't hesitate to contact me if I can be of assistance to you before our next scheduled telephone appointment.  Following are the goals we discussed today:   Our next appointment is by telephone on 08/21/22 at 10:30 AM  Please call the care guide team at [phone] if you need to cancel or reschedule your appointment.   If you are experiencing a Mental Health or [place] or need someone to talk to, please go to [facility] [place] [phone])   Following is a copy of your full plan of care:   Interventions  Spoke with  client today about client needs and financial challenges Client said she sometimes has difficulty sleeping due to stress issues Disussed medication procuremenet of client. She said she is concerned about paying for medications each month for her spouse Discussed transport situation of client.  Discussed oxygen use. Client uses oxygen at 2 liters via nasal canula at night to help with breathing Discussed ADLs completion. Client completes ADLs. She is sometimes fatigued afer doing certain ADLs  Regarding food supply,  client said she receives monthly food stamps benefit Regarding community resources, Care Guide is mailing [place]  list of community  resources for client to be aware of Discussed mood of client . Client is taking medications as prescribed; but, dealing with stress issues is difficult for client at present Client would be interested in RN calling her for nursing support LCSW to contact [place] to ask Scheduler to scheduled RN phone  visit with client Discussed mobility. Client has ramp at her home to use as needed; however, inside home she has 13 steps she uses to go from one floor to another. Client is at risk for falls Discussed client care with medical specialists. Provided counseling support for  client  Ms. Loomer was given information about Care Management services by the embedded care coordination team including:  Care Management services include personalized support from designated clinical staff supervised by her physician, including individualized plan of care and coordination with other care providers 24/7 contact phone numbers for assistance for urgent and routine care needs. The patient may stop CCM services at any time (effective at the end of the month) by phone call to the office staff.  Patient agreed to services and verbal consent obtained.   Norva Riffle.Camren Henthorn MSW, [place] Holiday representative [facility] Care Management [phone]

## 2022-08-01 NOTE — Patient Outreach (Signed)
  Care Coordination   Initial Visit Note   08/01/2022 Name: Dana [name] MRN: [id number] DOB: February 24, 1961  Dana [name] is a 62 y.o. year old female who sees Cox, Kirsten, MD for primary care. I spoke with  Dana [name] by phone today.  What matters to the patients health and wellness today? Client would like to speak with LCSW about stress management and about managing financial challenges    Goals Addressed               This Visit's Progress     patient would like to talk with LCSW about managing stress especially dealing with financial challenges (pt-stated)        Interventions  Spoke with  client today about client needs and financial challenges Client said she sometimes has difficulty sleeping due to stress issues Disussed medication procuremenet of client. She said she is concerned about paying for medications each month for her spouse Discussed transport situation of client.  Discussed oxygen use. Client uses oxygen at 2 liters via nasal canula at night to help with breathing Discussed ADLs completion. Client completes ADLs. She is sometimes fatigued afer doing certain ADLs  Regarding food supply,  client said she receives monthly food stamps benefit Regarding community resources, Care Guide is mailing Dana [name]  list of community  resources for client to be aware of Discussed mood of client . Client is taking medications as prescribed; but, dealing with stress issues is difficult for client at present Client would be interested in RN calling her for nursing support LCSW to contact Care Guide to ask Care guide to scheduled RN visit with client Discussed mobility. Client has ramp at her home to use as needed; however, inside home she has 13 steps she uses to go from one floor to another. Client is at risk for falls Discussed client care with medical specialists. Provided counseling support for client     SDOH assessments and interventions completed:  Yes  SDOH  Interventions Today    Flowsheet Row Most Recent Value  SDOH Interventions   Depression Interventions/Treatment  Medication, Counseling  Financial Strain Interventions Other (Comment)  [financial strain since husband is not working at present]  Stress Interventions Provide Counseling  [client has stress related to managing home finances. spouse is not working at [place] Interventions:  Yes, provided   Follow up plan: Follow up call scheduled for 08/21/22 at 10:30 AM    Encounter Outcome:  Pt. Visit Completed   Dana [name].Dana [name] MSW, LCSW Licensed Holiday representative [facility] Care Management [phone]

## 2022-08-02 NOTE — Progress Notes (Signed)
Office Visit Note  Patient: Dana [name]             Date of Birth: 03-10-1961           MRN: [id number]             PCP: [name], MD Referring: Leandrew Koyanagi, MD Visit Date: 08/14/2022 Occupation: '@GUAROCC'$ @  Subjective:  Pain and swelling in multiple joints   History of Present Illness: Dana [name] is a 62 y.o. female in consultation per request of Dr. Erlinda Hong.  According the patient her symptoms started about 3 years ago with joint pain in her right hand.  She states she has intermittent flares with increased severe pain and burning in her right hand.  She takes Tylenol and applies heat which helps to some extent.  She states about a year ago she started having pain in her bilateral hands.  She states with the weather change she notices increased discomfort in her left knee joint and her both feet.  It is difficult for her to see swelling in her feet because she has pedal edema.  She had left rotator cuff tear surgery several years ago which gives her intermittent discomfort.  She was evaluated by Dr. Sherrian Divers in July 2023 and he mentioned in his note there was no surgical options.  Patient was also evaluated by [facility] rheumatology about 3 years ago per patient there was nothing to offer.  She was given the diagnosis of gout in 2020 at the time she was a started on allopurinol 200 mg p.o. daily.  The dose of allopurinol was increased to 300 mg p.o. daily in July 2023.  She does not recall typical episodes of gout but she states that on the increased dose of allopurinol her hand symptoms improved to some extent.  She continues to have pain and discomfort in her bilateral hands and decreased grip strength.  There is no history of psoriasis in the family or personal history of psoriasis.  She denies any history of Achilles tendinitis of [place] fasciitis.  There is family history of osteoarthritis.  She is gravida 2, para 2, miscarriages 0.    Activities of Daily Living:  Patient reports  morning stiffness for 2 hours.   Patient Reports nocturnal pain.  Difficulty dressing/grooming: Reports Difficulty climbing stairs: Reports Difficulty getting out of chair: Reports Difficulty using hands for taps, buttons, cutlery, and/or writing: Reports  Review of Systems  Constitutional:  Positive for fatigue.  HENT:  Positive for mouth dryness. Negative for mouth sores.   Eyes:  Negative for dryness.  Respiratory:  Positive for shortness of breath.   Cardiovascular:  Negative for chest pain and palpitations.  Gastrointestinal:  Positive for constipation. Negative for blood in stool and diarrhea.  Endocrine: Negative for increased urination.  Genitourinary:  Negative for involuntary urination.  Musculoskeletal:  Positive for joint pain, gait problem, joint pain, joint swelling, myalgias, morning stiffness, muscle tenderness and myalgias. Negative for muscle weakness.  Skin:  Negative for color change, rash, hair loss and sensitivity to sunlight.  Allergic/Immunologic: Positive for susceptible to infections.  Neurological:  Negative for dizziness and headaches.  Hematological:  Negative for swollen glands.  Psychiatric/Behavioral:  Positive for depressed mood and sleep disturbance. The patient is nervous/anxious.     PMFS History:  Patient Active Problem List   Diagnosis Date Noted   Leukocytosis 03/29/2022   Genetic testing 11/07/2021   Family history of breast cancer 10/23/2021   GAD (generalized anxiety  disorder) 07/11/2021   Mixed hyperlipidemia 07/11/2021   S/P mitral valve clip implantation 05/18/2021   Anemia due to stage 3b chronic kidney disease ([place]) 04/04/2021   History of cardiac arrest 02/06/2021   Bilateral lower extremity edema 01/23/2021   Anoxic brain injury ([place]) 01/20/2021   Impaired mobility and ADLs 01/20/2021   Left-sided weakness 01/20/2021   Drug-induced myopathy 01/03/2021   Secondary hyperparathyroidism of renal origin ([place]) 11/18/2020   Vitamin D  insufficiency 11/18/2020   Contrast dye induced nephropathy, possible 09/03/2020   OSA on CPAP 09/03/2020   Need for COVID-19 vaccine 06/28/2020   Medicare annual wellness visit, subsequent 06/13/2020   Chronic respiratory failure with hypoxia ([name]) 03/23/2020   Chronic gout without tophus 03/23/2020   Pain in both hands 41/28/7867   Uncomplicated opioid dependence ([place]) 03/23/2020   Depression, major, recurrent, mild ([place]) 12/21/2019   Hypomagnesemia 12/21/2019   Diarrhea 12/21/2019   Lumbar back pain 10/05/2019   Diabetic nephropathy associated with type 2 diabetes mellitus ([place]) 10/05/2019   Hypertensive heart and renal disease with congestive heart failure ([place]) 09/18/2019   Demand ischemia    Diastolic heart failure ([place]) 11/07/2017   Class 2 severe obesity due to excess calories with serious comorbidity and body mass index (BMI) of 39.0 to 39.9 in adult [facility]) 11/07/2017   Coronary artery disease of native artery of native heart with stable angina pectoris ([place]) 12/25/2016   Hyperlipidemia LDL goal <70 12/14/2016   Anemia 03/23/2016   Chronic pain syndrome 03/23/2016   GERD without esophagitis 03/23/2016   On home oxygen therapy 03/23/2016   S/P ablation of atrial fibrillation 10/04/2015   Diabetic polyneuropathy associated with type 2 diabetes mellitus ([place]) 04/02/2014    Past Medical History:  Diagnosis Date   Acquired absence of left great toe (HCC)    Acquired absence of other left toe(s) ([place])    ACS (acute coronary syndrome) ([place])    Anemia    Anoxic brain injury ([place])    Anxiety    CAD (coronary artery disease)    DES mLAD 04/07/15; DES dRCA & DES pPDA 08/30/16; DES mCX & PTCA OM1 09/29/20   Chronic diastolic (congestive) heart failure (HCC)    Chronic pain    Chronic systolic (congestive) heart failure (HCC)    CKD (chronic kidney disease)    Contrast dye induced nephropathy, possible 09/03/2020   COPD (chronic obstructive pulmonary disease) (HCC)    Diabetes  (HCC)    Diastolic CHF ([name])    Dyspnea    Family history of breast cancer 10/23/2021   Gastro-esophageal reflux disease without esophagitis    Hyperlipidemia    Hypertension    Hypertensive heart disease with heart failure (HCC)    Hypoxemia    Idiopathic gout, multiple sites    Leukocytosis 03/29/2022   Localized edema    Low back pain    Mitral regurgitation    Mitral regurgitation    Mixed hyperlipidemia    Mixed incontinence    Morbid (severe) obesity due to excess calories (HCC)    Neuromuscular disorder (HCC)    Neuropathy    Non-ST elevation (NSTEMI) myocardial infarction ([place])    08/29/20, 09/29/20   OSA (obstructive sleep apnea) 09/03/2020   Other specified hypothyroidism    PAF (paroxysmal atrial fibrillation) (HCC)    s/p pulmonary vein isolation by cryoablation 10/04/15 Dr. Minna Merritts in [facility]   PEA (Pulseless electrical activity) [facility])    ~ 01/13/21 at [facility] during admission DKA, volume overload,  possible CAP, hypoxia s/p ACLS with ROSC ~ 10-15   Pneumonia    Primary insomnia    Rheumatoid arthritis (HCC)    Stroke (HCC)    Thyroid disease    Type 2 diabetes mellitus with diabetic nephropathy (HCC)    Vitamin D deficiency     Family History  Problem Relation Age of Onset   Breast cancer Mother 62       contralateral breast ca dx 25   Coronary artery disease Mother    Hypertension Mother    Hyperlipidemia Mother    Diabetes type II Mother    Lung cancer Mother 55   Diabetes Father    Hypertension Father    Mental illness Sister    Bipolar disorder Other    Depression Other    Schizophrenia Other    Cancer Other        MGF's sisters x2; unknown type   Past Surgical History:  Procedure Laterality Date   ABDOMINAL HYSTERECTOMY     Partial- Still has both ovaries   CARDIOVASCULAR STRESS TEST  08/13/2014   Nuclear; Normal   CARPAL TUNNEL RELEASE     CATARACT EXTRACTION, BILATERAL     CESAREAN SECTION     CHOLECYSTECTOMY     CORONARY  ANGIOPLASTY WITH STENT PLACEMENT     3 blockages/ 1 stent   CORONARY STENT INTERVENTION N/A 09/29/2020   Procedure: CORONARY STENT INTERVENTION;  Surgeon: Sherren Mocha, MD;  Location: [facility];  Service: Cardiovascular;  Laterality: N/A;  CFX   EYE SURGERY     LEFT HEART CATH AND CORONARY ANGIOGRAPHY N/A 09/29/2020   Procedure: LEFT HEART CATH AND CORONARY ANGIOGRAPHY;  Surgeon: Sherren Mocha, MD;  Location: [place];  Service: Cardiovascular;  Laterality: N/A;   MITRAL VALVE REPAIR N/A 05/18/2021   Procedure: MITRAL VALVE REPAIR;  Surgeon: Early Osmond, MD;  Location: [facility];  Service: Cardiovascular;  Laterality: N/A;   RIGHT HEART CATH N/A 04/27/2021   Procedure: RIGHT HEART CATH;  Surgeon: Jolaine Artist, MD;  Location: [facility];  Service: Cardiovascular;  Laterality: N/A;   RIGHT/LEFT HEART CATH AND CORONARY ANGIOGRAPHY N/A 01/07/2017   Procedure: Right/Left Heart Cath and Coronary Angiography;  Surgeon: Larey Dresser, MD;  Location: [place];  Service: Cardiovascular;  Laterality: N/A;   ROTATOR CUFF REPAIR     TEE WITHOUT CARDIOVERSION N/A 04/28/2021   Procedure: TRANSESOPHAGEAL ECHOCARDIOGRAM (TEE);  Surgeon: Jolaine Artist, MD;  Location: [facility];  Service: Cardiovascular;  Laterality: N/A;   TEE WITHOUT CARDIOVERSION N/A 05/18/2021   Procedure: TRANSESOPHAGEAL ECHOCARDIOGRAM (TEE);  Surgeon: Early Osmond, MD;  Location: [facility];  Service: Cardiovascular;  Laterality: N/A;   TOE AMPUTATION Left    US ECHOCARDIOGRAPHY  05/2017   Normal   Social History   Social History Narrative   Not on file   Immunization History  Administered Date(s) Administered   Influenza Inj Mdck Quad Pf 03/23/2020, 04/04/2021   Influenza, Quadrivalent, Recombinant, Inj, Pf 05/21/2018   Influenza,inj,Quad PF,6+ Mos 03/28/2015, 03/07/2022   Influenza-Unspecified 04/15/2014, 05/16/2016, 03/12/2019   Moderna Covid-19  Vaccine Bivalent Booster 50yr & up 05/09/2021   Moderna SARS-COV2 Booster Vaccination 06/28/2020   Moderna Sars-Covid-2 Vaccination 09/30/2019, 10/28/2019   Pneumococcal Conjugate-13 05/16/2014, 06/14/2014   Pneumococcal Polysaccharide-23 05/18/2013     Objective: Vital Signs: BP (!) 145/72 (BP Location: Right Arm, Patient Position: Sitting, Cuff Size: Large)   Pulse 72  Resp 12   Ht 5' 4.5" (1.638 m)   Wt 233 lb (105.7 kg)   BMI 39.38 kg/m    Physical Exam Vitals and nursing note reviewed.  Constitutional:      Appearance: She is well-developed.  HENT:     Head: Normocephalic and atraumatic.  Eyes:     Conjunctiva/sclera: Conjunctivae normal.  Cardiovascular:     Rate and Rhythm: Normal rate and regular rhythm.     Heart sounds: Normal heart sounds.  Pulmonary:     Effort: Pulmonary effort is normal.     Breath sounds: Normal breath sounds.  Abdominal:     General: Bowel sounds are normal.     Palpations: Abdomen is soft.  Musculoskeletal:     Cervical back: Normal range of motion.  Lymphadenopathy:     Cervical: No cervical adenopathy.  Skin:    General: Skin is warm and dry.     Capillary Refill: Capillary refill takes less than 2 seconds.  Neurological:     Mental Status: She is alert and oriented to person, place, and time.  Psychiatric:        Behavior: Behavior normal.      Musculoskeletal Exam: Cervical spine was in good range of motion.  Thoracic and lumbar spine range of motion was difficult to assess.  There was no tenderness over thoracic or lumbar spine.  Shoulder joint abduction was limited to 120 degrees due to previous rotator cuff tear.  Elbows were in good range of motion.  Wrist joints were in good range of motion without any synovitis.  There was no synovitis over MCP joints.  Bilateral PIP and DIP thickening with no synovitis was noted.  No nail pitting was noted.  Hip joints in good range of motion.  She had good range of motion of bilateral knee  joints without any warmth swelling or effusion.  There was no tenderness over ankles or MTPs.  She had amputation of her left first and second toe.  CDAI Exam: CDAI Score: -- Patient Global: --; Provider Global: -- Swollen: --; Tender: -- Joint Exam 08/14/2022   No joint exam has been documented for this visit   There is currently no information documented on the homunculus. Go to the Rheumatology activity and complete the homunculus joint exam.  Investigation: No additional findings.  Imaging: DG Chest 2 View  Result Date: 08/07/2022 CLINICAL DATA:  Chest pain and shortness of breath. EXAM: CHEST - 2 VIEW COMPARISON:  05/16/2021 FINDINGS: The heart is enlarged, stable in configuration. Interval placement of MitraClip devices. There is mild prominence of interstitial markings, likely chronic. No focal consolidations or pleural effusions. No pulmonary edema. IMPRESSION: Interval placement of MitraClip devices. Stable cardiomegaly. No acute cardiopulmonary process. Electronically Signed   By: Nolon Nations M.D.   On: 08/07/2022 14:18   ECHOCARDIOGRAM COMPLETE  Result Date: 07/18/2022    ECHOCARDIOGRAM REPORT   Patient Name:   Dana [name] Date of Exam: 07/18/2022 Medical Rec #:  [id number]      Height:       64.0 in Accession #:    [id number]     Weight:       234.0 lb Date of Birth:  September 25, 1960      BSA:          2.091 m Patient Age:    79 years       BP:           122/60 mmHg Patient Gender: F  HR:           70 bpm. Exam Location:  [place] Procedure: 2D Echo, Cardiac Doppler, Color Doppler and Intracardiac            Opacification Agent Indications:    Z98.890 S/P Mitral valve clip implantation; I34.0 Mitral valve                 insufficiency  History:        Patient has prior history of Echocardiogram examinations, most                 recent 09/18/2021. CHF, CAD; Risk Factors:Diabetes, Dyslipidemia                 and Sleep Apnea. S/P ablation of atrial fibrillation. Chronic                  pain syndrome. Obesity. PVD.                  Mitral Valve: Mitra-Clip valve is present in the mitral                 position. Procedure Date: 05/18/2021.  Sonographer:    Diamond Nickel RCS Referring Phys: Elnita Maxwell [facility] IMPRESSIONS  1. Left ventricular ejection fraction, by estimation, is 55 to 60%. The left ventricle has normal function. The left ventricle has no regional wall motion abnormalities. Indeterminate diastolic filling due to E-A fusion.  2. Right ventricular systolic function is normal. The right ventricular size is normal.  3. Left atrial size was mildly dilated.  4. The mitral valve has been repaired/replaced. Moderate mitral valve regurgitation. The mean mitral valve gradient is 7.5 mmHg with average heart rate of 75 bpm. There is a Mitra-Clip present in the mitral position. Procedure Date: 05/18/2021.  5. The aortic valve is grossly normal. Aortic valve regurgitation is not visualized. No aortic stenosis is present.  6. The inferior vena cava is normal in size with greater than 50% respiratory variability, suggesting right atrial pressure of 3 mmHg. Comparison(s): No significant change from prior study. Conclusion(s)/Recommendation(s): S/P Mitraclip x2. Study unchanged from prior. FINDINGS  Left Ventricle: Left ventricular ejection fraction, by estimation, is 55 to 60%. The left ventricle has normal function. The left ventricle has no regional wall motion abnormalities. Definity contrast agent was given IV to delineate the left ventricular  endocardial borders. The left ventricular internal cavity size was normal in size. There is no left ventricular hypertrophy. Indeterminate diastolic filling due to E-A fusion. Right Ventricle: The right ventricular size is normal. No increase in right ventricular wall thickness. Right ventricular systolic function is normal. Left Atrium: Left atrial size was mildly dilated. Right Atrium: Right atrial size was normal in size. Pericardium: There is no  evidence of pericardial effusion. Mitral Valve: The mitral valve has been repaired/replaced. Moderate mitral valve regurgitation. There is a Mitra-Clip present in the mitral position. Procedure Date: 05/18/2021. MV peak gradient, 20.8 mmHg. The mean mitral valve gradient is 7.5 mmHg with average heart rate of 75 bpm. Tricuspid Valve: The tricuspid valve is normal in structure. Tricuspid valve regurgitation is trivial. No evidence of tricuspid stenosis. Aortic Valve: The aortic valve is grossly normal. Aortic valve regurgitation is not visualized. No aortic stenosis is present. Pulmonic Valve: The pulmonic valve was not well visualized. Pulmonic valve regurgitation is trivial. No evidence of pulmonic stenosis. Aorta: The aortic root, ascending aorta, aortic arch and descending aorta are all structurally normal, with no evidence of  dilitation or obstruction. Venous: The inferior vena cava is normal in size with greater than 50% respiratory variability, suggesting right atrial pressure of 3 mmHg. IAS/Shunts: The atrial septum is grossly normal.  LEFT VENTRICLE PLAX 2D LVIDd:         5.10 cm LVIDs:         3.30 cm LV PW:         0.80 cm LV IVS:        0.80 cm LVOT diam:     1.90 cm LV SV:         72 LV SV Index:   34 LVOT Area:     2.84 cm  RIGHT VENTRICLE TAPSE (M-mode): 2.0 cm LEFT ATRIUM             Index        RIGHT ATRIUM           Index LA diam:        4.00 cm 1.91 cm/m   RA Area:     13.30 cm LA Vol (A2C):   59.5 ml 28.45 ml/m  RA Volume:   29.40 ml  14.06 ml/m LA Vol (A4C):   76.2 ml 36.44 ml/m LA Biplane Vol: 68.2 ml 32.61 ml/m  AORTIC VALVE LVOT Vmax:   102.00 cm/s LVOT Vmean:  68.600 cm/s LVOT VTI:    0.253 m  AORTA Ao Root diam: 3.00 cm Ao Asc diam:  3.10 cm MITRAL VALVE MV Area (PHT): 3.54 cm     SHUNTS MV Area VTI:   1.30 cm     Systemic VTI:  0.25 m MV Peak grad:  20.8 mmHg    Systemic Diam: 1.90 cm MV Mean grad:  7.5 mmHg MV Vmax:       2.28 m/s MV Vmean:      125.5 cm/s MV Decel Time: 214 msec  MR Peak grad: 116.2 mmHg MR Mean grad: 75.0 mmHg MR Vmax:      539.00 cm/s MR Vmean:     408.0 cm/s MV E velocity: 221.00 cm/s MV A velocity: 114.00 cm/s MV E/A ratio:  1.94 Buford Dresser MD Electronically signed by Buford Dresser MD Signature Date/Time: 07/18/2022/4:46:03 PM    Final     Recent Labs: Lab Results  Component Value Date   WBC 12.4 (H) 08/07/2022   HGB 9.9 (L) 08/07/2022   PLT 247 08/07/2022   NA 137 08/09/2022   K 4.2 08/09/2022   CL 103 08/09/2022   CO2 27 08/09/2022   GLUCOSE 136 (H) 08/09/2022   BUN 68 (H) 08/09/2022   CREATININE 2.18 (H) 08/09/2022   BILITOT 0.5 07/19/2022   ALKPHOS 133 (H) 07/19/2022   AST 18 07/19/2022   ALT 16 07/19/2022   PROT 7.5 07/19/2022   ALBUMIN 3.4 (L) 07/19/2022   CALCIUM 8.5 (L) 08/09/2022   GFRAA 43 (L) 06/28/2020    Speciality Comments: No specialty comments available.  Procedures:  No procedures performed Allergies: Naproxen, Celecoxib, Aspirin, Glucosamine, Iron, Metoclopramide, Metolazone, Other, Shellfish-derived products, Statins, Strawberry (diagnostic), Strawberry extract, Strawberry flavor, Strawberry leaves extract, and Tramadol   Assessment / Plan:     Visit Diagnoses: Pain in both hands-patient complains of pain and discomfort and mostly in her right hand.  She states she has progressively difficulty making the complete fist.  She also has intermittent discomfort in her left hand.  She states the pain gets worse during the winter months.  Tylenol and heat helps to some extent.  She had no synovitis  on the examination of her hands.  She had bilateral PIP and DIP thickening.  I reviewed the x-rays obtained by Dr. Erlinda Hong from January 25, 2022 which showed osteoarthritic changes.  No MCP or intercarpal joint space narrowing was noted.  No erosive changes were noted.  These findings are consistent with osteoarthritis.  Detailed counsel regarding osteoarthritis was provided to patient and her husband.  I had a detailed  discussion with the patient regarding the clinical and radiographic findings.  I offered x-ray of her left hand but she declined.  A handout on hand exercises was given.  I also advised her to contact me if she develops any increased joint swelling.  Chronic left shoulder pain - h/o RCT tear repair  Chronic pain of left knee-she gives history of intermittent discomfort in her left knee joint.  No warmth swelling or effusion was noted.  I offered x-rays but she declined.  A handout on knee joint exercises was given.  Pain in both feet-she is in pain in her feet.  No synovitis was noted.  Amputation of the left first and second toe was noted.  Patient declined x-rays.  Rheumatoid factor positive - 01/19/22: RF 223.1, Anti-CCP negative, Uric acid 9.0, TSH 2.900.  She has positive rheumatoid factor.  No synovitis was noted on the examination.  Association of positive rheumatoid factor with rheumatoid arthritis was discussed.  I advised her to contact me if she develops any increased swelling.  Hyperuricemia-there is history of hyperuricemia.  Her uric acid was 9.0 on January 19, 2022.  At that time allopurinol dose was increased to 300 mg.  She has not had a gout flare.  She states she noticed some improvement in her joint symptoms on increased dose of allopurinol.  She is planning to see her PCP next month to have repeat uric acid levels.  Other medical problems are listed as follows:  Coronary artery disease of native artery of native heart with stable angina pectoris ([place])  Hypertensive heart and renal disease with congestive heart failure (HCC)  S/P ablation of atrial fibrillation  S/P mitral valve clip implantation  History of cardiac arrest  Hyperlipidemia LDL goal <70  Diabetic nephropathy associated with type 2 diabetes mellitus (HCC)  Contrast dye induced nephropathy, possible  Drug-induced myopathy  Chronic respiratory failure with hypoxia (HCC)  On home oxygen therapy - 2L oxygen  at bedtime.  GERD without esophagitis  Secondary hyperparathyroidism of renal origin ([place])  Uncomplicated opioid dependence (HCC)-she is on hydrocodone for pain management.  Anemia due to stage 3b chronic kidney disease (HCC)  OSA on CPAP  Class 2 severe obesity due to excess calories with serious comorbidity and body mass index (BMI) of 39.0 to 39.9 in adult ([place])  Anxiety and depression  Hypomagnesemia  Vitamin D insufficiency  Family history of breast cancer  Orders: No orders of the defined types were placed in this encounter.  No orders of the defined types were placed in this encounter.    Follow-Up Instructions: Return if symptoms worsen or fail to improve, for pain in multiple joints, positive rheumatoid factor, Osteoarthritis.   Bo Merino, MD  Note - This record has been created using Editor, commissioning.  Chart creation errors have been sought, but may not always  have been located. Such creation errors do not reflect on  the standard of medical care.

## 2022-08-07 ENCOUNTER — Other Ambulatory Visit: Payer: Self-pay

## 2022-08-07 ENCOUNTER — Encounter (HOSPITAL_COMMUNITY): Payer: Self-pay

## 2022-08-07 ENCOUNTER — Encounter (HOSPITAL_COMMUNITY): Payer: Self-pay | Admitting: Internal Medicine

## 2022-08-07 ENCOUNTER — Emergency Department (HOSPITAL_COMMUNITY): Payer: PPO

## 2022-08-07 ENCOUNTER — Inpatient Hospital Stay: Payer: PPO

## 2022-08-07 ENCOUNTER — Emergency Department (HOSPITAL_COMMUNITY)
Admission: EM | Admit: 2022-08-07 | Discharge: 2022-08-07 | Disposition: A | Discharge status: Home / Self Care | Payer: PPO | Attending: Emergency Medicine | Admitting: Emergency Medicine

## 2022-08-07 DIAGNOSIS — R0602 Shortness of breath: Secondary | ICD-10-CM | POA: Diagnosis not present

## 2022-08-07 DIAGNOSIS — R7989 Other specified abnormal findings of blood chemistry: Secondary | ICD-10-CM | POA: Insufficient documentation

## 2022-08-07 DIAGNOSIS — Z7902 Long term (current) use of antithrombotics/antiplatelets: Secondary | ICD-10-CM | POA: Diagnosis not present

## 2022-08-07 DIAGNOSIS — R079 Chest pain, unspecified: Secondary | ICD-10-CM | POA: Diagnosis not present

## 2022-08-07 DIAGNOSIS — N1832 Chronic kidney disease, stage 3b: Secondary | ICD-10-CM

## 2022-08-07 DIAGNOSIS — Z794 Long term (current) use of insulin: Secondary | ICD-10-CM | POA: Diagnosis not present

## 2022-08-07 DIAGNOSIS — I110 Hypertensive heart disease with heart failure: Secondary | ICD-10-CM | POA: Diagnosis not present

## 2022-08-07 DIAGNOSIS — I509 Heart failure, unspecified: Secondary | ICD-10-CM | POA: Insufficient documentation

## 2022-08-07 DIAGNOSIS — E1140 Type 2 diabetes mellitus with diabetic neuropathy, unspecified: Secondary | ICD-10-CM | POA: Insufficient documentation

## 2022-08-07 LAB — BASIC METABOLIC PANEL
Anion gap: 8 (ref 5–15)
BUN: 65 mg/dL — ABNORMAL HIGH (ref 6–20)
CO2: 25 mmol/L (ref 22–32)
Calcium: 9 mg/dL (ref 8.9–10.3)
Chloride: 104 mmol/L (ref 98–111)
Creatinine, Ser: 1.74 mg/dL — ABNORMAL HIGH (ref 0.44–1.00)
GFR, Estimated: 33 mL/min — ABNORMAL LOW (ref >60)
Glucose, Bld: 130 mg/dL — ABNORMAL HIGH (ref 70–99)
Potassium: 4 mmol/L (ref 3.5–5.1)
Sodium: 137 mmol/L (ref 135–145)

## 2022-08-07 LAB — TROPONIN I (HIGH SENSITIVITY)
Troponin I (High Sensitivity): 9 ng/L (ref <18)
Troponin I (High Sensitivity): 11 ng/L (ref <18)

## 2022-08-07 LAB — CBC WITH DIFFERENTIAL (CANCER CENTER ONLY)
Abs Immature Granulocytes: 0.06 10*3/uL (ref 0.00–0.07)
Basophils Absolute: 0.1 10*3/uL (ref 0.0–0.1)
Basophils Relative: 0 %
Eosinophils Absolute: 0.3 10*3/uL (ref 0.0–0.5)
Eosinophils Relative: 2 %
HCT: 32.2 % — ABNORMAL LOW (ref 36.0–46.0)
Hemoglobin: 9.8 g/dL — ABNORMAL LOW (ref 12.0–15.0)
Immature Granulocytes: 1 %
Lymphocytes Relative: 15 %
Lymphs Abs: 1.9 10*3/uL (ref 0.7–4.0)
MCH: 26.6 pg (ref 26.0–34.0)
MCHC: 30.4 g/dL (ref 30.0–36.0)
MCV: 87.5 fL (ref 80.0–100.0)
Monocytes Absolute: 0.8 10*3/uL (ref 0.1–1.0)
Monocytes Relative: 6 %
Neutro Abs: 9.8 10*3/uL — ABNORMAL HIGH (ref 1.7–7.7)
Neutrophils Relative %: 76 %
Platelet Count: 260 10*3/uL (ref 150–400)
RBC: 3.68 MIL/uL — ABNORMAL LOW (ref 3.87–5.11)
RDW: 16.3 % — ABNORMAL HIGH (ref 11.5–15.5)
WBC Count: 12.9 10*3/uL — ABNORMAL HIGH (ref 4.0–10.5)
nRBC: 0 % (ref 0.0–0.2)

## 2022-08-07 LAB — CBC
HCT: 31 % — ABNORMAL LOW (ref 36.0–46.0)
Hemoglobin: 9.9 g/dL — ABNORMAL LOW (ref 12.0–15.0)
MCH: 27.6 pg (ref 26.0–34.0)
MCHC: 31.9 g/dL (ref 30.0–36.0)
MCV: 86.4 fL (ref 80.0–100.0)
Platelets: 247 10*3/uL (ref 150–400)
RBC: 3.59 MIL/uL — ABNORMAL LOW (ref 3.87–5.11)
RDW: 16.1 % — ABNORMAL HIGH (ref 11.5–15.5)
WBC: 12.4 10*3/uL — ABNORMAL HIGH (ref 4.0–10.5)
nRBC: 0 % (ref 0.0–0.2)

## 2022-08-07 LAB — I-STAT BETA HCG BLOOD, ED (MC, WL, AP ONLY): I-stat hCG, quantitative: 5.2 m[IU]/mL — ABNORMAL HIGH (ref <5)

## 2022-08-07 LAB — BRAIN NATRIURETIC PEPTIDE: B Natriuretic Peptide: 146.9 pg/mL — ABNORMAL HIGH (ref 0.0–100.0)

## 2022-08-07 LAB — CBG MONITORING, ED: Glucose-Capillary: 60 mg/dL — ABNORMAL LOW (ref 70–99)

## 2022-08-07 MED ORDER — FUROSEMIDE 10 MG/ML IJ SOLN
40.0000 mg | Freq: Once | INTRAMUSCULAR | Status: AC
  Administered 2022-08-07: 40 mg via INTRAVENOUS
  Filled 2022-08-07: qty 4

## 2022-08-07 MED ORDER — HYDROCODONE-ACETAMINOPHEN 5-325 MG PO TABS
2.0000 | ORAL_TABLET | Freq: Once | ORAL | Status: AC
  Administered 2022-08-07: 2 via ORAL
  Filled 2022-08-07: qty 2

## 2022-08-07 MED ORDER — FUROSEMIDE 10 MG/ML IJ SOLN: 20.0000 mg | Freq: Once | INTRAMUSCULAR | Status: DC

## 2022-08-07 MED ORDER — ONDANSETRON HCL 4 MG/2ML IJ SOLN
4.0000 mg | Freq: Once | INTRAMUSCULAR | Status: AC
  Administered 2022-08-07: 4 mg via INTRAVENOUS
  Filled 2022-08-07: qty 2

## 2022-08-07 NOTE — ED Provider Triage Note (Signed)
Emergency Medicine Provider Triage Evaluation Note  Dana [name] , a 62 y.o. female  was evaluated in triage.  Pt complains of concerns for severe chest pain onset 11:30 AM after her appointment today.  Patient is that the chest pain started after she was walking.  Has associated shortness of breath.  Patient has a history of 2 MIs in February and March 2022 with 4 stents placed.  Patient notes that she has taken a total of 3 nitros.  She notes that after the third nitro her pain went from a 7 out of 10 to a 3 out of 10.  Notes that her pain feels somewhat similar to her her previous MIs however the pain is not as intense.  Review of Systems  Positive:  Negative:   Physical Exam  BP (!) 173/64 (BP Location: Right Arm)   Pulse 82   Temp 98 F (36.7 C) (Oral)   Resp (!) 21   Ht '5\' 4"'$  (1.626 m)   Wt 108 kg   SpO2 97%   BMI 40.85 kg/m  Gen:   Awake, no distress   Resp:  Normal effort MSK:   Moves extremities without difficulty  Other:    Medical Decision Making  Medically screening exam initiated at 1:28 PM.  Appropriate orders placed.  TENSLEY WERY was informed that the remainder of the evaluation will be completed by another provider, this initial triage assessment does not replace that evaluation, and the importance of remaining in the ED until their evaluation is complete.  1:29 PM - Discussed with RN that patient is in need of a room immediately. RN aware and working on room placement.    Maysoon Lozada A, PA-C 08/07/22 1334

## 2022-08-07 NOTE — Discharge Instructions (Signed)
Continue your home medications.  Call your cardiologist tomorrow to be seen for recheck

## 2022-08-07 NOTE — ED Provider Notes (Signed)
[facility] Provider Note   CSN: [id number] Arrival date & time: 08/07/22  1256     History  Chief Complaint  Patient presents with   Chest Pain    Dana [name] is a 62 y.o. female.  Patient complains of increased swelling in her lower extremities patient reports she has had increasing shortness of breath which is worse when she ambulates.  Patient reports she has a history of congestive heart failure.  She called the heart failure clinic today and attempted to schedule an appointment to be seen but they were not able to see her until next month.  Patient reports that she has a history of kidney disease which prevents her from taking Lasix.  Denies a history of coronary artery disease.  She has had multiple stents.  She has a past medical history of stage III renal disease.  Patient has diastolic heart failure.  Patient has diabetic neuropathy.  Patient is followed by Dr. Tempie Hoist  The history is provided by the patient and a caregiver. No language interpreter was used.  Shortness of Breath Severity:  Moderate Onset quality:  Gradual Timing:  Constant Progression:  Worsening Associated symptoms: chest pain        Home Medications Prior to Admission medications   Medication Sig Start Date End Date Taking? Authorizing Provider  allopurinol (ZYLOPRIM) 300 MG tablet Take 1 tablet (300 mg total) by mouth daily. 07/05/22   Cox, Elnita Maxwell, MD  busPIRone (BUSPAR) 5 MG tablet Take 1 tablet (5 mg total) by mouth 3 (three) times daily. 04/10/22   Cox, Elnita Maxwell, MD  carvedilol (COREG) 12.5 MG tablet TAKE ONE TABLET BY MOUTH TWICE DAILY 01/09/22   Rafael Bihari, FNP  clopidogrel (PLAVIX) 75 MG tablet Take 1 tablet (75 mg total) by mouth daily. 07/05/22   CoxElnita Maxwell, MD  Continuous Blood Gluc Sensor (FREESTYLE LIBRE 2 SENSOR) MISC USE TO check blood glucose AS DIRECTED AND CHANGE sensor every 14 DAYS 01/11/22   [provider]   EPINEPHrine 0.3 mg/0.3 mL IJ SOAJ injection Use as directed for life-threatening allergic reaction. 06/24/18   Kennith Gain, MD  Evolocumab (REPATHA SURECLICK) 010 MG/ML SOAJ Inject 1 Dose into the skin every 14 (fourteen) days. 01/17/22   Hilty, Nadean Corwin, MD  ezetimibe (ZETIA) 10 MG tablet Take 1 tablet (10 mg total) by mouth daily. 10/11/21   Early Osmond, MD  hydrALAZINE (APRESOLINE) 25 MG tablet Take 1 tablet (25 mg total) by mouth 3 (three) times daily. 02/02/22   [place]ford, Maricela Bo, FNP  HYDROcodone-acetaminophen (NORCO) 7.5-325 MG tablet TAKE ONE TABLET BY MOUTH THREE TIMES DAILY AS NEEDED 07/31/22   Cox, Elnita Maxwell, MD  insulin aspart (NOVOLOG FLEXPEN) 100 UNIT/ML FlexPen 2-10 U before meals based on SSI. 04/10/22   Cox, Kirsten, MD  Insulin Pen Needle (BD PEN NEEDLE NANO 2ND GEN) 32G X 4 MM MISC Inject 1 each into the skin in the morning, at noon, in the evening, and at bedtime. 08/30/21   CoxElnita Maxwell, MD  isosorbide mononitrate (IMDUR) 30 MG 24 hr tablet Take 1 tablet (30 mg total) by mouth daily. 07/05/22   Cox, Elnita Maxwell, MD  lactulose (CHRONULAC) 10 GM/15ML solution Take 15 mLs (10 g total) by mouth daily as needed for mild constipation. 08/30/21   Cox, Elnita Maxwell, MD  latanoprost (XALATAN) 0.005 % ophthalmic solution Place 1 drop into both eyes at bedtime.    [provider]  LEVEMIR FLEXPEN 100 UNIT/ML  FlexPen Inject 50 units into THE SKIN daily 07/30/22   Cox, Kirsten, MD  LORazepam (ATIVAN) 0.5 MG tablet TAKE ONE TABLET BY MOUTH ONCE daily AS NEEDED FOR ANXIETY 07/22/22   Cox, Kirsten, MD  magnesium oxide (MAG-OX) 400 MG tablet Take 2 tablets (800 mg total) by mouth daily. Take 2 (400 mg) daily=800 mg 04/09/18   Shirley Friar, PA-C  nitrofurantoin (MACRODANTIN) 50 MG capsule Take 50 mg by mouth at bedtime. 05/24/22   [provider]  nitroGLYCERIN (NITROSTAT) 0.4 MG SL tablet Dissolve 1 tab under tongue as needed for chest pain. May repeat every 5 minutes x  2 doses. If no relief call 9-1-1. Please call [phone] to schedule an appointment for future refills. Thank you. 07/13/22   Eileen Stanford, PA-C  Omega-3 Fatty Acids (FISH OIL PO) Take 1 capsule by mouth daily.    [provider]  OXYGEN Inhale 2 L into the lungs at bedtime.    [provider]  pantoprazole (PROTONIX) 40 MG tablet Take 1 tablet (40 mg total) by mouth daily. 07/05/22   CoxElnita Maxwell, MD  Potassium Chloride ER 20 MEQ TBCR Take 1 tablet by mouth daily. 07/05/22   Cox, Elnita Maxwell, MD  potassium chloride SA (KLOR-CON M) 20 MEQ tablet TAKE ONE TABLET BY MOUTH ONCE daily 07/06/22   Cox, Elnita Maxwell, MD  Probiotic CAPS Take 1 capsule by mouth daily.    [provider]  promethazine (PHENERGAN) 25 MG tablet TAKE ONE TABLET BY MOUTH daily AS NEEDED 06/06/22   Lillard Anes, MD  torsemide (DEMADEX) 20 MG tablet TAKE 4 TABLETS BY MOUTH ONCE DAILY 04/09/22   Bensimhon, Shaune Pascal, MD      Allergies    Naproxen, Celecoxib, Aspirin, Glucosamine, Iron, Metoclopramide, Metolazone, Other, Shellfish-derived products, Statins, Strawberry (diagnostic), Strawberry extract, Strawberry flavor, Strawberry leaves extract, and Tramadol    Review of Systems   Review of Systems  Respiratory:  Positive for shortness of breath.   Cardiovascular:  Positive for chest pain.  All other systems reviewed and are negative.   Physical Exam Updated Vital Signs BP (!) 159/58   Pulse 74   Temp 97.8 F (36.6 C) (Oral)   Resp 16   Ht '5\' 4"'$  (1.626 m)   Wt 108 kg   SpO2 96%   BMI 40.85 kg/m  Physical Exam Vitals and nursing note reviewed.  Constitutional:      Appearance: She is well-developed.  HENT:     Head: Normocephalic.  Cardiovascular:     Rate and Rhythm: Normal rate and regular rhythm.     Heart sounds: Normal heart sounds.  Pulmonary:     Effort: Pulmonary effort is normal.     Breath sounds: Normal breath sounds.  Abdominal:     General: There is no  distension.     Palpations: Abdomen is soft.  Musculoskeletal:        General: Normal range of motion.     Cervical back: Normal range of motion.  Skin:    General: Skin is warm.  Neurological:     General: No focal deficit present.     Mental Status: She is alert and oriented to person, place, and time.     ED Results / Procedures / Treatments   Labs (all labs ordered are listed, but only abnormal results are displayed) Labs Reviewed  BASIC METABOLIC PANEL - Abnormal; Notable for the following components:      Result Value   Glucose, Bld 130 (*)  BUN 65 (*)    Creatinine, Ser 1.74 (*)    GFR, Estimated 33 (*)    All other components within normal limits  CBC - Abnormal; Notable for the following components:   WBC 12.4 (*)    RBC 3.59 (*)    Hemoglobin 9.9 (*)    HCT 31.0 (*)    RDW 16.1 (*)    All other components within normal limits  BRAIN NATRIURETIC PEPTIDE - Abnormal; Notable for the following components:   B Natriuretic Peptide 146.9 (*)    All other components within normal limits  I-STAT BETA HCG BLOOD, ED ([facility], [facility], [facility] ONLY) - Abnormal; Notable for the following components:   I-stat hCG, quantitative 5.2 (*)    All other components within normal limits  CBG MONITORING, ED - Abnormal; Notable for the following components:   Glucose-Capillary 60 (*)    All other components within normal limits  TROPONIN I (HIGH SENSITIVITY)  TROPONIN I (HIGH SENSITIVITY)    EKG None  Radiology DG Chest 2 View  Result Date: 08/07/2022 CLINICAL DATA:  Chest pain and shortness of breath. EXAM: CHEST - 2 VIEW COMPARISON:  05/16/2021 FINDINGS: The heart is enlarged, stable in configuration. Interval placement of MitraClip devices. There is mild prominence of interstitial markings, likely chronic. No focal consolidations or pleural effusions. No pulmonary edema. IMPRESSION: Interval placement of MitraClip devices. Stable cardiomegaly. No acute cardiopulmonary process.  Electronically Signed   By: Nolon Nations M.D.   On: 08/07/2022 14:18    Procedures Procedures    Medications Ordered in ED Medications  ondansetron (ZOFRAN) injection 4 mg (4 mg Intravenous Given 08/07/22 1617)  HYDROcodone-acetaminophen (NORCO/VICODIN) 5-325 MG per tablet 2 tablet (2 tablets Oral Given 08/07/22 1816)  furosemide (LASIX) injection 40 mg (40 mg Intravenous Given 08/07/22 1840)    ED Course/ Medical Decision Making/ A&P                             Medical Decision Making Patient complains of swelling in her lower extremities and increasing shortness of breath.  Patient attempted to see her cardiologist but they are unable to see her until next month  Amount and/or Complexity of Data Reviewed Independent Historian:     Details: Is here with her daughter who provides history and is supportive External Data Reviewed: notes.    Details: Allergy notes reviewed Labs: ordered. Decision-making details documented in ED Course.    Details: Labs ordered reviewed and interpreted troponins are negative BNP is slightly elevated at 146 Radiology: ordered and independent interpretation performed. Decision-making details documented in ED Course.    Details: X-ray ordered reviewed and interpreted.  No pulmonary edema Discussion of management or test interpretation with external provider(s): Spoke with Dr. Stanford Breed cardiologist on call for patient's cardiologist discussed with him patient's fluid overload.  Plan is to give patient 40 mg of Lasix IV here patient can be discharged if she remains pain-free  Risk Prescription drug management. Risk Details: Patient is given IV Lasix she reports decreased shortness of breath she is able to ambulate with out shortness of breath.           Final Clinical Impression(s) / ED Diagnoses Final diagnoses:  Congestive heart failure, unspecified HF chronicity, unspecified heart failure type ([place])    Rx / DC Orders ED Discharge Orders      None      An After Visit Summary was printed and given to  the patient.    Fransico Meadow, [place] 08/07/22 2241    Pattricia Boss, MD 08/09/22 [phone]

## 2022-08-07 NOTE — ED Notes (Signed)
Pt resting in bed with acute distress or changes noted from original assessment.

## 2022-08-07 NOTE — ED Triage Notes (Signed)
Patient reports started having severe chest pain around 11am and took 3 nitro and 3rd nitro its now discomfort.  +Sob and nausea denies radiation Patient did not take any ASA.

## 2022-08-08 ENCOUNTER — Telehealth (HOSPITAL_COMMUNITY): Payer: Self-pay | Admitting: Vascular Surgery

## 2022-08-08 NOTE — Telephone Encounter (Signed)
PT called stating she was in ED yesterday , she would like a close f/u w/ db please advise , she has appt w/ db 2/15

## 2022-08-08 NOTE — Telephone Encounter (Signed)
Appt sch for 1/25

## 2022-08-09 ENCOUNTER — Ambulatory Visit (HOSPITAL_COMMUNITY)
Admission: RE | Admit: 2022-08-09 | Discharge: 2022-08-09 | Disposition: A | Discharge status: Home / Self Care | Payer: PPO | Source: Ambulatory Visit | Attending: Cardiology | Admitting: Cardiology

## 2022-08-09 ENCOUNTER — Encounter (HOSPITAL_COMMUNITY): Payer: Self-pay

## 2022-08-09 ENCOUNTER — Inpatient Hospital Stay: Payer: PPO

## 2022-08-09 VITALS — BP 126/60 | HR 76 | Wt 241.2 lb

## 2022-08-09 DIAGNOSIS — R040 Epistaxis: Secondary | ICD-10-CM | POA: Insufficient documentation

## 2022-08-09 DIAGNOSIS — I5042 Chronic combined systolic (congestive) and diastolic (congestive) heart failure: Secondary | ICD-10-CM | POA: Diagnosis not present

## 2022-08-09 DIAGNOSIS — G4733 Obstructive sleep apnea (adult) (pediatric): Secondary | ICD-10-CM | POA: Diagnosis not present

## 2022-08-09 DIAGNOSIS — D500 Iron deficiency anemia secondary to blood loss (chronic): Secondary | ICD-10-CM | POA: Diagnosis not present

## 2022-08-09 DIAGNOSIS — E1043 Type 1 diabetes mellitus with diabetic autonomic (poly)neuropathy: Secondary | ICD-10-CM | POA: Diagnosis not present

## 2022-08-09 DIAGNOSIS — N1832 Chronic kidney disease, stage 3b: Secondary | ICD-10-CM | POA: Insufficient documentation

## 2022-08-09 DIAGNOSIS — Z79899 Other long term (current) drug therapy: Secondary | ICD-10-CM | POA: Insufficient documentation

## 2022-08-09 DIAGNOSIS — Z955 Presence of coronary angioplasty implant and graft: Secondary | ICD-10-CM | POA: Diagnosis not present

## 2022-08-09 DIAGNOSIS — I480 Paroxysmal atrial fibrillation: Secondary | ICD-10-CM | POA: Diagnosis not present

## 2022-08-09 DIAGNOSIS — I340 Nonrheumatic mitral (valve) insufficiency: Secondary | ICD-10-CM | POA: Diagnosis not present

## 2022-08-09 DIAGNOSIS — I130 Hypertensive heart and chronic kidney disease with heart failure and stage 1 through stage 4 chronic kidney disease, or unspecified chronic kidney disease: Secondary | ICD-10-CM | POA: Diagnosis not present

## 2022-08-09 DIAGNOSIS — Z7902 Long term (current) use of antithrombotics/antiplatelets: Secondary | ICD-10-CM | POA: Diagnosis not present

## 2022-08-09 DIAGNOSIS — E785 Hyperlipidemia, unspecified: Secondary | ICD-10-CM | POA: Insufficient documentation

## 2022-08-09 DIAGNOSIS — I2510 Atherosclerotic heart disease of native coronary artery without angina pectoris: Secondary | ICD-10-CM | POA: Insufficient documentation

## 2022-08-09 DIAGNOSIS — I5032 Chronic diastolic (congestive) heart failure: Secondary | ICD-10-CM

## 2022-08-09 DIAGNOSIS — K3184 Gastroparesis: Secondary | ICD-10-CM | POA: Diagnosis not present

## 2022-08-09 DIAGNOSIS — E1022 Type 1 diabetes mellitus with diabetic chronic kidney disease: Secondary | ICD-10-CM | POA: Diagnosis not present

## 2022-08-09 DIAGNOSIS — Z794 Long term (current) use of insulin: Secondary | ICD-10-CM | POA: Diagnosis not present

## 2022-08-09 LAB — BASIC METABOLIC PANEL
Anion gap: 7 (ref 5–15)
BUN: 68 mg/dL — ABNORMAL HIGH (ref 6–20)
CO2: 27 mmol/L (ref 22–32)
Calcium: 8.5 mg/dL — ABNORMAL LOW (ref 8.9–10.3)
Chloride: 103 mmol/L (ref 98–111)
Creatinine, Ser: 2.18 mg/dL — ABNORMAL HIGH (ref 0.44–1.00)
GFR, Estimated: 25 mL/min — ABNORMAL LOW (ref >60)
Glucose, Bld: 136 mg/dL — ABNORMAL HIGH (ref 70–99)
Potassium: 4.2 mmol/L (ref 3.5–5.1)
Sodium: 137 mmol/L (ref 135–145)

## 2022-08-09 LAB — BRAIN NATRIURETIC PEPTIDE: B Natriuretic Peptide: 101 pg/mL — ABNORMAL HIGH (ref 0.0–100.0)

## 2022-08-09 MED ORDER — METOLAZONE 2.5 MG PO TABS: 2.5000 mg | ORAL_TABLET | ORAL | 0 refills | Status: DC | PRN

## 2022-08-09 NOTE — Addendum Note (Signed)
Encounter addended by: Payton Mccallum, RN on: 08/09/2022 10:13 AM  Actions taken: Charge Capture section accepted

## 2022-08-09 NOTE — Progress Notes (Signed)
Advanced Heart Failure [name] Note   PCP: Dana Brome, MD PCP-Cardiologist: Dana Bickers, MD   Reason for Visit: f/u chronic diastolic HF  HPI: [name] is 62 y.o. female with history of diastolic heart failure s/p cardiomems 02/2018, CAD, s/p DES RCA and LAD 2018, DMI, htn, hyperlipidemia, PAD, and OSA on CPAP, PAF s/p ablation, PAD s/p L great toe and 4th toe amputation, stage IIIb CKD.   Admitted 03/22 with NSTEMI and a/c CHF. LHC 03/22 showed moderate mid-LAD and mid-RCA disease, severe mid-L-Cx stenosis s/p PCI/DES mid Cx and side branch angioplasty of OM1.   Unfortunately she developed a contrast-induced nephropathy after cath and creatinine increased from 2.2 to a peak of 5.5. Additionally, she developed severe nausea and vomiting, gastroparesis and required IV antiplatelet therapy.  During this time she developed epistaxis and acute blood loss anemia requiring transfusion.  She was switched back to oral DAPT. Diuretics were reintroduced and her creatinine began to improve over the following days. Her leukocytosis persisted but all her infectious workup was negative and this was felt to be reactive.    Per chart review, she was admitted 6/22 to [facility] for DKA, started on insulin gtt, IVF and diuretics held. Developed pulmonary vascular congestion vs PNA and was started on IV abx, and diuretics were resumed. She suffered a PEA arrest 01/12/21 with ROSC after 10-15 minutes of CPR and was transferred to [name]; precipitating factor for PEA arrest thought to be respiratory driven. On arrival, she was still in DKA, AKI on CKD with oliguria and Nephrology was consulted for further management. TTE with EF 60%, min MR/TR. Concern for stroke due to left-sided weakness and MRI confirmed anoxic brain injury, but no definite infarct. She was extubated, SCr returned to baseline of 1.6-1.8 and she was discharged to rehab facility.   Zio placed 8/22 for palpitations, showing mostly SB/SR no afib, 66  SVT runs, frequent isolated SVE (11.2%).  Admitted from [name] 10/22 for A/C CHF after failing outpatient escalation in diuretic therapy. She was enrolled in FASTR trial and diuresed > 15 lb.  Hospitalization complicated by AKI on CKD, Scr up to 3.13. RHC showed mild to moderate elevating filling pressures with large v-waves in PCWP tracing suggesting severe MR vs severe diastolic dysfunction. TEE 55-60% showed severe central MR.   Patient underwent TEER of mitral valve with placement of 1XT W and 1NT W clip on 05/18/2021. NT W clip was entangled and had to be deployed into lateral part of valve. No additional clip placement could be preformed. Echo 11/04 with EF 50-55% and residual moderate MR. Given 1 dose IV lasix prior to discharge.  Echo 3/23 showed EF 55-60%, RV OK, moderate MR mean gradient 7-8 mmHg. Saw [name] and MR now moderate, no indication for 3rd clip.   Had recent echo done and reviewed by [name] 07/18/22. Echo showed LVEF 55-60%, normal RV and moderate MR, mG 7.5 mmHg, essentially unchanged from prior echo.   Seen in ED earlier this wk on 1/23 w/ complaints of SOB, LEE and chest pressure. Ruled out for ACS. Hs trop 9>>11. BNP mildly elevated 146. CXR stable cardiomegaly. No acute cardiopulmonary process including no pulmonary edema. EKG NSR, no acute ST abnormalities. Hgb 9.9. SCr 1.74. She was given a dose of IV Lasix and discharged home.   She presents to [name] today for f/u. Notes continued symptoms. SOB w/ exertion. Not SOB at rest. Says wt is up ~5.5 lb from baseline. CardioMEMs elevated at  14 (goal 10). When seen in ED 1/23, CardioMEMs reading was 17. ReDs is also elevated today at 42%. She reports full compliance w/ torsemide. Admits to some dietary indiscretion w/ sodium. BP ok 126/60.   Hgb has been running 10-10.4. Now on procrit every other week.    Cardiac Studies: - Echo (1/24): EF 55-60%, normal RV and moderate MR, mG 7.5 mmHg, essentially  unchanged from prior echo.   - Echo (3/23): EF 55-60%, RV normal, moderate MR  - Echo 11/22: EF 50-55%, residual moderate MR  - RHC (10/22):   RA = 8 RV = 59/9 PA = 61/17 (35) PCW = mean 23 (v = 49) -> performed multiple wedges Fick cardiac output/index = PVR = 1.6 WU FA sat = 98% PA sat = 69%, 70% PaPi = 5.5 Assessment: 1. Mild to moderately elevated filling pressures with very large v-waves in PCWP tracing suggestive of severe diastolic dysfunction vs severe MR  - TEE (10/22): EF 55-60%, basal inferior akinesis, severe central MR. - Echo (10/22): EF 65-70%, RV ok, moderate MR - Echo (5/21): EF 60-65%, RV ok, Grade II DD. - Echo (2/22): EF 55-60%, RV ok, Grade II DD, mod MR - Echo (3/22): EF 55-60%, Grade II DD, normal RV - Echo (6/22): EF 60%, mod MR/TR (per chart review at [place]). - Zio (8/19): no arrhythmias, occasional PAC/PVCs - Zio (9/22): mostly SB/SR no afib, 66 SVT runs, frequent isolated SVE (11.2%).  - LHC (3/22): 3 vessel CAD with moderate mid-LAD and mid-RCA stenoses, and severe mid-LCx stenosis; successful PCI of the mid-circumflex, reducing severe 90% stenosis to 0% with a 2.75x22 mm Resolute Onyx DES, provisional side branch angioplasty of the OM1 with a 2.5 mm balloon through the stent struts  Review of systems complete and found to be negative unless listed in HPI.    Past Medical History:  Diagnosis Date   Acquired absence of left great toe (HCC)    Acquired absence of other left toe(s) ([place])    ACS (acute coronary syndrome) ([place])    Anemia    Anoxic brain injury ([place])    Anxiety    CAD (coronary artery disease)    DES mLAD 04/07/15; DES dRCA & DES pPDA 08/30/16; DES mCX & PTCA OM1 09/29/20   Chronic diastolic (congestive) heart failure (HCC)    Chronic pain    Chronic systolic (congestive) heart failure (HCC)    CKD (chronic kidney disease)    Contrast dye induced nephropathy, possible 09/03/2020   COPD (chronic obstructive pulmonary disease) (HCC)     Diabetes (HCC)    Diastolic CHF (HCC)    Dyspnea    Family history of breast cancer 10/23/2021   Gastro-esophageal reflux disease without esophagitis    Hyperlipidemia    Hypertension    Hypertensive heart disease with heart failure (HCC)    Hypoxemia    Idiopathic gout, multiple sites    Leukocytosis 03/29/2022   Localized edema    Low back pain    Mitral regurgitation    Mitral regurgitation    Mixed hyperlipidemia    Mixed incontinence    Morbid (severe) obesity due to excess calories (HCC)    Neuromuscular disorder (HCC)    Neuropathy    Non-ST elevation (NSTEMI) myocardial infarction ([place])    08/29/20, 09/29/20   OSA (obstructive sleep apnea) 09/03/2020   Other specified hypothyroidism    PAF (paroxysmal atrial fibrillation) (HCC)    s/p pulmonary vein isolation by cryoablation 10/04/15 Dr. Minna Merritts [facility]   PEA (Pulseless electrical activity) ([place])    ~ 01/13/21 at [facility] during admission DKA, volume overload, possible CAP, hypoxia s/p ACLS with ROSC ~ 10-15   Pneumonia    Primary insomnia    Rheumatoid arthritis ([place])    Stroke ([place])    Thyroid disease    Type 2 diabetes mellitus with diabetic nephropathy (HCC)    Vitamin D deficiency     Current Outpatient Medications  Medication Sig Dispense Refill   allopurinol (ZYLOPRIM) 300 MG tablet Take 1 tablet (300 mg total) by mouth daily. 90 tablet 1   busPIRone (BUSPAR) 5 MG tablet Take 1 tablet (5 mg total) by mouth 3 (three) times daily. 270 tablet 1   carvedilol (COREG) 12.5 MG tablet TAKE ONE TABLET BY MOUTH TWICE DAILY 180 tablet 3   clopidogrel (PLAVIX) 75 MG tablet Take 1 tablet (75 mg total) by mouth daily. 90 tablet 1   Continuous Blood Gluc Sensor (FREESTYLE LIBRE 2 SENSOR) MISC USE TO check blood glucose AS DIRECTED AND CHANGE sensor every 14 DAYS     EPINEPHrine 0.3 mg/0.3 mL IJ SOAJ injection Use as directed for life-threatening allergic reaction. 4 Device 3   Evolocumab (REPATHA SURECLICK) 585  MG/ML SOAJ Inject 1 Dose into the skin every 14 (fourteen) days. 6 mL 1   ezetimibe (ZETIA) 10 MG tablet Take 1 tablet (10 mg total) by mouth daily. 90 tablet 3   hydrALAZINE (APRESOLINE) 25 MG tablet Take 1 tablet (25 mg total) by mouth 3 (three) times daily. 90 tablet 6   HYDROcodone-acetaminophen (NORCO) 7.5-325 MG tablet TAKE ONE TABLET BY MOUTH THREE TIMES DAILY AS NEEDED 90 tablet 0   insulin aspart (NOVOLOG FLEXPEN) 100 UNIT/ML FlexPen 2-10 U before meals based on SSI. 15 mL 3   Insulin Pen Needle (BD PEN NEEDLE NANO 2ND GEN) 32G X 4 MM MISC Inject 1 each into the skin in the morning, at noon, in the evening, and at bedtime. 600 each 3   isosorbide mononitrate (IMDUR) 30 MG 24 hr tablet Take 1 tablet (30 mg total) by mouth daily. 90 tablet 1   lactulose (CHRONULAC) 10 GM/15ML solution Take 15 mLs (10 g total) by mouth daily as needed for mild constipation. 236 mL 1   latanoprost (XALATAN) 0.005 % ophthalmic solution Place 1 drop into both eyes at bedtime.     LEVEMIR FLEXPEN 100 UNIT/ML FlexPen Inject 50 units into THE SKIN daily 15 mL 1   LORazepam (ATIVAN) 0.5 MG tablet TAKE ONE TABLET BY MOUTH ONCE daily AS NEEDED FOR ANXIETY 30 tablet 2   magnesium oxide (MAG-OX) 400 MG tablet Take 2 tablets (800 mg total) by mouth daily. Take 2 (400 mg) daily=800 mg 60 tablet 1   nitrofurantoin (MACRODANTIN) 50 MG capsule Take 50 mg by mouth at bedtime.     nitroGLYCERIN (NITROSTAT) 0.4 MG SL tablet Dissolve 1 tab under tongue as needed for chest pain. May repeat every 5 minutes x 2 doses. If no relief call 9-1-1. Please call [phone] to schedule an appointment for future refills. Thank you. 50 tablet 0   Omega-3 Fatty Acids (FISH OIL PO) Take 1 capsule by mouth daily.     OXYGEN Inhale 2 L into the lungs at bedtime.     pantoprazole (PROTONIX) 40 MG tablet Take 1 tablet (40 mg total) by mouth daily. 90 tablet 1   Potassium Chloride ER 20 MEQ TBCR Take 1 tablet by mouth daily. 90 tablet 1  potassium chloride SA (KLOR-CON M) 20 MEQ tablet TAKE ONE TABLET BY MOUTH ONCE daily 90 tablet 1   Probiotic CAPS Take 1 capsule by mouth daily.     promethazine (PHENERGAN) 25 MG tablet TAKE ONE TABLET BY MOUTH daily AS NEEDED 30 tablet 3   torsemide (DEMADEX) 20 MG tablet TAKE 4 TABLETS BY MOUTH ONCE DAILY 336 tablet 3   No current facility-administered medications for this encounter.   Allergies  Allergen Reactions   Naproxen Hives and Rash   Celecoxib Other (See Comments)    Stomach pain   Aspirin Other (See Comments)    CAN NOT TAKE DUE TO ULCERS (documented previously) but takes every day currently (as of 09/29/20) without reported issues Can take coated aspirin   Glucosamine Hives, Swelling and Other (See Comments)    Angioedema    Iron     IV iron transfusion - hives - Feb 2022 hospitalization   Metoclopramide Other (See Comments)    Facial twitching and stuttering   Metolazone Other (See Comments)    Acute renal failure   Other Hives    Strawberry allergy   Shellfish-Derived Products Hives, Swelling and Other (See Comments)    Angioedema    Statins Other (See Comments)    Muscle pain   Strawberry (Diagnostic)    Strawberry Extract    Strawberry Flavor    Strawberry Leaves Extract    Tramadol Nausea And Vomiting        Social History   Socioeconomic History   Marital status: Married    Spouse name: [name]   Number of children: 2   Years of education: 16   Highest education level: Master's degree (e.g., MA, Dana, MEng, MEd, MSW, MBA)  Occupational History   Occupation: on disability/retired early former Education officer, museum  Tobacco Use   Smoking status: Former    Types: Cigarettes    Start date: 1983    Quit date: 1984    Years since quitting: 40.0   Smokeless tobacco: Never  Vaping Use   Vaping Use: Never used  Substance and Sexual Activity   Alcohol use: Never   Drug use: Never   Sexual activity: Not on file  Other Topics Concern   Not on file  Social  History Narrative   Not on file   Social Determinants of Health   Financial Resource Strain: Medium Risk (08/01/2022)   Overall Financial Resource Strain (CARDIA)    Difficulty of Paying Living Expenses: Somewhat hard  Food Insecurity: Food Insecurity Present (07/26/2022)   Hunger Vital Sign    Worried About Running Out of Food in the Last Year: Sometimes true    Ran Out of Food in the Last Year: Sometimes true  Transportation Needs: No Transportation Needs (07/25/2022)   PRAPARE - Hydrologist (Medical): No    Lack of Transportation (Non-Medical): No  Physical Activity: Insufficiently Active (02/15/2020)   Exercise Vital Sign    Days of Exercise per Week: 3 days    Minutes of Exercise per Session: 40 min  Stress: Stress Concern Present (08/01/2022)   [place]    Feeling of Stress : To some extent  Social Connections: Moderately Integrated (07/25/2022)   Social Connection and Isolation Panel [NHANES]    Frequency of Communication with Friends and Family: More than three times a week    Frequency of Social Gatherings with Friends and Family: More than three times a week  Attends Religious Services: More than 4 times per year    Active Member of Clubs or Organizations: No    Attends Archivist Meetings: Never    Marital Status: Married  Human resources officer Violence: Not At Risk (07/25/2022)   Humiliation, Afraid, Rape, and Kick questionnaire    Fear of Current or Ex-Partner: No    Emotionally Abused: No    Physically Abused: No    Sexually Abused: No   Family History  Problem Relation Age of Onset   Breast cancer Mother 9       contralateral breast ca dx 38   Coronary artery disease Mother    Hypertension Mother    Hyperlipidemia Mother    Diabetes type II Mother    Lung cancer Mother 5   Diabetes Father    Hypertension Father    Mental illness Sister    Bipolar disorder  Other    Depression Other    Schizophrenia Other    Cancer Other        MGF's sisters x2; unknown type   Pulse 76   Wt 109.4 kg (241 lb 3.2 oz)   SpO2 96%   BMI 41.40 kg/m   Wt Readings from Last 3 Encounters:  08/09/22 109.4 kg (241 lb 3.2 oz)  08/07/22 108 kg (238 lb)  07/26/22 108.3 kg (238 lb 12 oz)   PHYSICAL EXAM: ReDs 42%  General:  Well appearing, presents in [place]. No resp difficulty HEENT: normal Neck: supple. Thick neck JVD not well visualized. Carotids 2+ bilat; no bruits. No lymphadenopathy or thryomegaly appreciated. Cor: PMI nondisplaced. Regular rate & rhythm. 2/6 MR murmur  Lungs: decreased BS at the bases bilaterally  Abdomen: obese, soft, nontender, nondistended. No hepatosplenomegaly. No bruits or masses. Good bowel sounds. Extremities: no cyanosis, clubbing, rash, trace b/l LE edema + TED hoses  Neuro: alert & orientedx3, cranial nerves grossly intact. moves all 4 extremities w/o difficulty. Affect pleasant  CardioMEMs: Goal 10. Elevated today at 14 Personally reviewed   ASSESSMENT & PLAN: 1.  Acute on Chronic Diastolic Heart Failure - Echo 08/2020 EF 55-60%  - Echo (3/22): EF 55-60%, Grade II DD, normal RV - Echo (6/22): EF 60%, mod MR/TR (per chart review at [place]). - Admit 10/22 with a/c CHF, enrolled in FASTR trial and diuresed 15 lbs. Severe MR likely contributing to decompensation.  - RHC (10/22): mild to moderate elevated filling pressures, large v-waves PCWP tracing - TEE (10/22): EF 55-60%, basal inferior akinesis, severe central MR.>>Underwent TEER>>mod residual MR - Echo (11/22): EF 50-55%, severe LV HK, moderate MR - Echo (3/23): EF 55-60%, RV OK, moderate MR mean gradient 7-8 mmHg - Echo (1/24): EF 55-60%, RV normal, mod MR mG 7.5 mmHg  - NYHA II-early III. Volume status elevated on CardioMEMs (dPAP 14, goal 10) and REDs (42%) - Continue Torsemide 80 mg daily  - Take 2.5 mg of metolazone today x 1 w/ extra 40 mEq of KCl. Can repeat again  this weekend on 1/27 PRN, based on wt and symptoms. Instructed to continue daily CardioMEMs transmissions. Will have a provider personally review again on Monday  - Continue hydralazine 25 mg tid. - Continue carvedilol 12.5 mg bid. - Continue Imdur 30 mg daily. - No SGLT2i with T1DM. - Continue TED hose. - Check BMP and BNP today    2. CAD - Known CAD with prior stents to mid LAD and pRCA.  - NSTEMI (2/22). Cath deferred due to AKI. Treated medically.  - NSTEMI (  7/22)-->S/p PCI/DES to mid Cx, balloon angioplasty to OM1  - No recent ischemic CP  - Continue ASA + Plavix for now.  - Lipids under good control w/ Repatha + Zetia (LDL 66 7/22). Followed by Dr. Debara Pickett.    3. CKD IIIb - Prior h/o CIN following LHC - Previous SCr baseline 1.6-1.8 with multiple AKIs over the last year. New baseline ~ 2.0-2.5 - Followed by Nephrology in [facility] - Recent labs reviewed and are stable with Scr 1.74 on 1/23 - will repeat BMP today   4. Mitral regurgitation - Severe central MR on TEE on 10/22.   - S/p TEER 05/18/2021 - two clips placed, one became entangled and had to be deployed. - Moderate residual MR on echo 11/22. - Recent echo 1/24 with stable moderate MR, mG 7.5 mmHg  - Structural team following. No indication for re-clip, per patient report.   5. OSA - Continue CPAP qhs.    6. DM1 - Hgb A1c 6.6 10/22. - No SGLT2i.   7. Chronic Anemia - Baseline 8-9, felt to be 2/2 renal disease.  - Hx severe epistaxis and ABLA requiring transfusion. Hx rash with IV iron. - Following with Heme/Onc. Receiving Retacrit monthly.  8. Palpitations - Hx PAF s/p ablation at least 10 years ago. No documented recurrence. Not currently on anticoagulation. Would need to consider risk/benefits of anticoagulation if recurs especially with history of anemia requiring transfusion - Zio XT 14 day (8/22) showed mostly SB/SR no afib, 66 SVT runs, frequent isolated SVE (11.2%). - Symptomatically much improved -  Continue carvedilol 12.5 mg bid.  Keep f/u w/ MD in 3 wks   Dana Jester, PA-C  9:09 AM

## 2022-08-09 NOTE — Patient Instructions (Signed)
Thank you for coming in today  Labs were done today, if any labs are abnormal the clinic will call you No news is good news  TAKE Metolazone 2.5 mg with a extra of Potassium 40 meq today. You are able to repeat that this weekend one additional time.  Your physician recommends that you schedule a follow-up appointment in:  Keep follow up with Dr. [name] in February     Do the following things EVERYDAY: Weigh yourself in the morning before breakfast. Write it down and keep it in a log. Take your medicines as prescribed Eat low salt foods--Limit salt (sodium) to 2000 mg per day.  Stay as active as you can everyday Limit all fluids for the day to less than 2 liters  At the [facility], you and your health needs are our priority. As part of our continuing mission to provide you with exceptional heart care, we have created designated Provider Care Teams. These Care Teams include your primary Cardiologist (physician) and Advanced Practice Providers (APPs- Physician Assistants and Nurse Practitioners) who all work together to provide you with the care you need, when you need it.   You may see any of the following providers on your designated Care Team at your next follow up: Dr [name] Dr Loralie Champagne Dr. Roxana Hires, NP Lyda Jester, [place] [facility] [place], [place] Forestine Na, NP Audry Riles, PharmD   Please be sure to bring in all your medications bottles to every appointment.   If you have any questions or concerns before your next appointment please send [name] a message through [place] or call our office at [phone].    TO LEAVE A MESSAGE FOR THE NURSE SELECT OPTION 2, PLEASE LEAVE A MESSAGE INCLUDING: YOUR NAME DATE OF BIRTH CALL BACK NUMBER REASON FOR CALL**this is important as we prioritize the call backs  YOU WILL RECEIVE A CALL BACK THE SAME DAY AS LONG AS YOU CALL BEFORE 4:00 PM

## 2022-08-13 ENCOUNTER — Inpatient Hospital Stay: Payer: PPO

## 2022-08-13 VITALS — BP 119/55 | HR 70 | Temp 98.3°F | Resp 18 | Ht 64.0 in | Wt 242.0 lb

## 2022-08-13 DIAGNOSIS — N1832 Chronic kidney disease, stage 3b: Secondary | ICD-10-CM | POA: Diagnosis not present

## 2022-08-13 DIAGNOSIS — N189 Chronic kidney disease, unspecified: Secondary | ICD-10-CM

## 2022-08-13 MED ORDER — EPOETIN ALFA-EPBX 20000 UNIT/ML IJ SOLN
20000.0000 [IU] | Freq: Once | INTRAMUSCULAR | Status: AC
  Administered 2022-08-13: 20000 [IU] via SUBCUTANEOUS
  Filled 2022-08-13: qty 1

## 2022-08-13 NOTE — Patient Instructions (Signed)

## 2022-08-14 ENCOUNTER — Telehealth: Payer: Self-pay | Admitting: *Deleted

## 2022-08-14 ENCOUNTER — Ambulatory Visit: Payer: PPO | Attending: Rheumatology | Admitting: Rheumatology

## 2022-08-14 ENCOUNTER — Encounter: Payer: Self-pay | Admitting: Rheumatology

## 2022-08-14 ENCOUNTER — Telehealth (HOSPITAL_COMMUNITY): Payer: Self-pay | Admitting: Adult Health

## 2022-08-14 VITALS — BP 145/72 | HR 72 | Resp 12 | Ht 64.5 in | Wt 233.0 lb

## 2022-08-14 DIAGNOSIS — G8929 Other chronic pain: Secondary | ICD-10-CM

## 2022-08-14 DIAGNOSIS — I25118 Atherosclerotic heart disease of native coronary artery with other forms of angina pectoris: Secondary | ICD-10-CM

## 2022-08-14 DIAGNOSIS — J9611 Chronic respiratory failure with hypoxia: Secondary | ICD-10-CM

## 2022-08-14 DIAGNOSIS — M25512 Pain in left shoulder: Secondary | ICD-10-CM | POA: Diagnosis not present

## 2022-08-14 DIAGNOSIS — M79672 Pain in left foot: Secondary | ICD-10-CM

## 2022-08-14 DIAGNOSIS — N1832 Chronic kidney disease, stage 3b: Secondary | ICD-10-CM

## 2022-08-14 DIAGNOSIS — E785 Hyperlipidemia, unspecified: Secondary | ICD-10-CM | POA: Diagnosis not present

## 2022-08-14 DIAGNOSIS — Z803 Family history of malignant neoplasm of breast: Secondary | ICD-10-CM

## 2022-08-14 DIAGNOSIS — D631 Anemia in chronic kidney disease: Secondary | ICD-10-CM

## 2022-08-14 DIAGNOSIS — E1121 Type 2 diabetes mellitus with diabetic nephropathy: Secondary | ICD-10-CM

## 2022-08-14 DIAGNOSIS — E790 Hyperuricemia without signs of inflammatory arthritis and tophaceous disease: Secondary | ICD-10-CM

## 2022-08-14 DIAGNOSIS — R768 Other specified abnormal immunological findings in serum: Secondary | ICD-10-CM

## 2022-08-14 DIAGNOSIS — G720 Drug-induced myopathy: Secondary | ICD-10-CM

## 2022-08-14 DIAGNOSIS — K219 Gastro-esophageal reflux disease without esophagitis: Secondary | ICD-10-CM

## 2022-08-14 DIAGNOSIS — Z8679 Personal history of other diseases of the circulatory system: Secondary | ICD-10-CM

## 2022-08-14 DIAGNOSIS — Z6839 Body mass index (BMI) 39.0-39.9, adult: Secondary | ICD-10-CM

## 2022-08-14 DIAGNOSIS — F32A Depression, unspecified: Secondary | ICD-10-CM

## 2022-08-14 DIAGNOSIS — Z9981 Dependence on supplemental oxygen: Secondary | ICD-10-CM

## 2022-08-14 DIAGNOSIS — M79641 Pain in right hand: Secondary | ICD-10-CM

## 2022-08-14 DIAGNOSIS — E782 Mixed hyperlipidemia: Secondary | ICD-10-CM

## 2022-08-14 DIAGNOSIS — F419 Anxiety disorder, unspecified: Secondary | ICD-10-CM

## 2022-08-14 DIAGNOSIS — T508X5A Adverse effect of diagnostic agents, initial encounter: Secondary | ICD-10-CM

## 2022-08-14 DIAGNOSIS — I130 Hypertensive heart and chronic kidney disease with heart failure and stage 1 through stage 4 chronic kidney disease, or unspecified chronic kidney disease: Secondary | ICD-10-CM | POA: Diagnosis not present

## 2022-08-14 DIAGNOSIS — M79671 Pain in right foot: Secondary | ICD-10-CM

## 2022-08-14 DIAGNOSIS — Z9889 Other specified postprocedural states: Secondary | ICD-10-CM | POA: Diagnosis not present

## 2022-08-14 DIAGNOSIS — F1120 Opioid dependence, uncomplicated: Secondary | ICD-10-CM

## 2022-08-14 DIAGNOSIS — G4733 Obstructive sleep apnea (adult) (pediatric): Secondary | ICD-10-CM

## 2022-08-14 DIAGNOSIS — F330 Major depressive disorder, recurrent, mild: Secondary | ICD-10-CM

## 2022-08-14 DIAGNOSIS — F411 Generalized anxiety disorder: Secondary | ICD-10-CM | POA: Diagnosis not present

## 2022-08-14 DIAGNOSIS — E559 Vitamin D deficiency, unspecified: Secondary | ICD-10-CM

## 2022-08-14 DIAGNOSIS — M25562 Pain in left knee: Secondary | ICD-10-CM

## 2022-08-14 DIAGNOSIS — Z8674 Personal history of sudden cardiac arrest: Secondary | ICD-10-CM

## 2022-08-14 DIAGNOSIS — N1411 Contrast-induced nephropathy: Secondary | ICD-10-CM

## 2022-08-14 DIAGNOSIS — Z95818 Presence of other cardiac implants and grafts: Secondary | ICD-10-CM

## 2022-08-14 DIAGNOSIS — N2581 Secondary hyperparathyroidism of renal origin: Secondary | ICD-10-CM

## 2022-08-14 DIAGNOSIS — M79642 Pain in left hand: Secondary | ICD-10-CM

## 2022-08-14 NOTE — Telephone Encounter (Signed)
   Called and provided results.   Cardiomems Remote Monitoring  S/P Cardiomems Implant   PAD Goal: 10 Most recent reading: 12  Recommended changes: Stable. No change  I continue to review and analyze the patients PA pressures weekly (and more often as needed) to bring PA pressures within the optimal range.            Arek Spadafore NP-C  3:20 PM

## 2022-08-14 NOTE — Patient Instructions (Addendum)
Hand Exercises Hand exercises can be helpful for almost anyone. These exercises can strengthen the hands, improve flexibility and movement, and increase blood flow to the hands. These results can make work and daily tasks easier. Hand exercises can be especially helpful for people who have joint pain from arthritis or have nerve damage from overuse (carpal tunnel syndrome). These exercises can also help people who have injured a hand. Exercises Most of these hand exercises are gentle stretching and motion exercises. It is usually safe to do them often throughout the day. Warming up your hands before exercise may help to reduce stiffness. You can do this with gentle massage or by placing your hands in warm water for 10-15 minutes. It is normal to feel some stretching, pulling, tightness, or mild discomfort as you begin new exercises. This will gradually improve. Stop an exercise right away if you feel sudden, severe pain or your pain gets worse. Ask your health care provider which exercises are best for you. Knuckle bend or "claw" fist  Stand or sit with your arm, hand, and all five fingers pointed straight up. Make sure to keep your wrist straight during the exercise. Gently bend your fingers down toward your palm until the tips of your fingers are touching the top of your palm. Keep your big knuckle straight and just bend the small knuckles in your fingers. Hold this position for __________ seconds. Straighten (extend) your fingers back to the starting position. Repeat this exercise 5-10 times with each hand. Full finger fist  Stand or sit with your arm, hand, and all five fingers pointed straight up. Make sure to keep your wrist straight during the exercise. Gently bend your fingers into your palm until the tips of your fingers are touching the middle of your palm. Hold this position for __________ seconds. Extend your fingers back to the starting position, stretching every joint fully. Repeat  this exercise 5-10 times with each hand. Straight fist Stand or sit with your arm, hand, and all five fingers pointed straight up. Make sure to keep your wrist straight during the exercise. Gently bend your fingers at the big knuckle, where your fingers meet your hand, and the middle knuckle. Keep the knuckle at the tips of your fingers straight and try to touch the bottom of your palm. Hold this position for __________ seconds. Extend your fingers back to the starting position, stretching every joint fully. Repeat this exercise 5-10 times with each hand. Tabletop  Stand or sit with your arm, hand, and all five fingers pointed straight up. Make sure to keep your wrist straight during the exercise. Gently bend your fingers at the big knuckle, where your fingers meet your hand, as far down as you can while keeping the small knuckles in your fingers straight. Think of forming a tabletop with your fingers. Hold this position for __________ seconds. Extend your fingers back to the starting position, stretching every joint fully. Repeat this exercise 5-10 times with each hand. Finger spread  Place your hand flat on a table with your palm facing down. Make sure your wrist stays straight as you do this exercise. Spread your fingers and thumb apart from each other as far as you can until you feel a gentle stretch. Hold this position for __________ seconds. Bring your fingers and thumb tight together again. Hold this position for __________ seconds. Repeat this exercise 5-10 times with each hand. Making circles  Stand or sit with your arm, hand, and all five fingers pointed   straight up. Make sure to keep your wrist straight during the exercise. Make a circle by touching the tip of your thumb to the tip of your index finger. Hold for __________ seconds. Then open your hand wide. Repeat this motion with your thumb and each finger on your hand. Repeat this exercise 5-10 times with each hand. Thumb  motion  Sit with your forearm resting on a table and your wrist straight. Your thumb should be facing up toward the ceiling. Keep your fingers relaxed as you move your thumb. Lift your thumb up as high as you can toward the ceiling. Hold for __________ seconds. Bend your thumb across your palm as far as you can, reaching the tip of your thumb for the small finger (pinkie) side of your palm. Hold for __________ seconds. Repeat this exercise 5-10 times with each hand. Grip strengthening  Hold a stress ball or other soft ball in the middle of your hand. Slowly increase the pressure, squeezing the ball as much as you can without causing pain. Think of bringing the tips of your fingers into the middle of your palm. All of your finger joints should bend when doing this exercise. Hold your squeeze for __________ seconds, then relax. Repeat this exercise 5-10 times with each hand. Contact a health care provider if: Your hand pain or discomfort gets much worse when you do an exercise. Your hand pain or discomfort does not improve within 2 hours after you exercise. If you have any of these problems, stop doing these exercises right away. Do not do them again unless your health care provider says that you can. Get help right away if: You develop sudden, severe hand pain or swelling. If this happens, stop doing these exercises right away. Do not do them again unless your health care provider says that you can. This information is not intended to replace advice given to you by your health care provider. Make sure you discuss any questions you have with your health care provider. Document Revised: 10/20/2020 Document Reviewed: 10/20/2020 Elsevier Patient Education  2023 [employer] Exercises for Chronic Knee Pain Chronic knee pain is pain that lasts longer than 3 months. For most people with chronic knee pain, exercise and weight loss is an important part of treatment. Your health care provider may want  you to focus on: Strengthening the muscles that support your knee. This can take pressure off your knee and lessen pain. Preventing knee stiffness. Maintaining or increasing how far you can move your knee. Losing weight (if this applies) to take pressure off your knee, decrease your risk for injury, and make it easier for you to exercise. Your health care provider will help you develop an exercise program that matches your needs and physical abilities. Below are simple, low-impact exercises you can do at home. Ask your health care provider or a physical therapist how often you should do your exercise program and how many times to repeat each exercise. General safety tips Follow these safety tips for exercising with chronic knee pain: Get your health care provider's approval before doing any exercises. Start slowly and stop any time an exercise causes pain. Do not exercise if your knee pain is flaring up. Warm up first. Stretching a cold muscle can cause an injury. Do 5-10 minutes of easy movement or light stretching before beginning your exercise routine. Do 5-10 minutes of low-impact activity (like walking or cycling) before starting strengthening exercises. Contact your health care provider any time you have pain   during or after exercising. Exercise may cause discomfort but should not be painful. It is normal to be a little stiff or sore after exercising.  Stretching and range-of-motion exercises Front thigh stretch  Stand up straight and support your body by holding on to a chair or resting one hand on a wall. With your legs straight and close together, bend one knee to lift your heel up toward your buttocks. Using one hand for support, grab your ankle with your free hand. Pull your foot up closer toward your buttocks to feel the stretch in front of your thigh. Hold the stretch for 30 seconds. Repeat __________ times. Complete this exercise __________ times a day. Back thigh stretch  Sit  on the floor with your back straight and your legs out straight in front of you. Place the palms of your hands on the floor and slide them toward your feet as you bend at the hip. Try to touch your nose to your knees and feel the stretch in the back of your thighs. Hold for 30 seconds. Repeat __________ times. Complete this exercise __________ times a day. Calf stretch  Stand facing a wall. Place the palms of your hands flat against the wall, arms extended, and lean slightly against the wall. Get into a lunge position with one leg bent at the knee and the other leg stretched out straight behind you. Keep both feet facing the wall and increase the bend in your knee while keeping the heel of the other leg flat on the ground. You should feel the stretch in your calf. Hold for 30 seconds. Repeat __________ times. Complete this exercise __________ times a day. Strengthening exercises Straight leg lift Lie on your back with one knee bent and the other leg out straight. Slowly lift the straight leg without bending the knee. Lift until your foot is about 12 inches (30 cm) off the floor. Hold for 3-5 seconds and slowly lower your leg. Repeat __________ times. Complete this exercise __________ times a day. Single leg dip Stand between two chairs and put both hands on the backs of the chairs for support. Extend one leg out straight with your body weight resting on the heel of the standing leg. Slowly bend your standing knee to dip your body to the level that is comfortable for you. Hold for 3-5 seconds. Repeat __________ times. Complete this exercise __________ times a day. Hamstring curls Stand straight, knees close together, facing the back of a chair. Hold on to the back of a chair with both hands. Keep one leg straight. Bend the other knee while bringing the heel up toward the buttock until the knee is bent at a 90-degree angle (right angle). Hold for 3-5 seconds. Repeat __________ times.  Complete this exercise __________ times a day. Wall squat Stand straight with your back, hips, and head against a wall. Step forward one foot at a time with your back still against the wall. Your feet should be 2 feet (61 cm) from the wall at shoulder width. Keeping your back, hips, and head against the wall, slide down the wall to as close of a sitting position as you can get. Hold for 5-10 seconds, then slowly slide back up. Repeat __________ times. Complete this exercise __________ times a day. Step-ups Step up with one foot onto a sturdy platform or stool that is about 6 inches (15 cm) high. Face sideways with one foot on the platform and one on the ground. Place all your weight on  the platform foot and lift your body off the ground until your knee extends. Let your other leg hang free to the side. Hold for 3-5 seconds then slowly lower your weight down to the floor foot. Repeat __________ times. Complete this exercise __________ times a day. Contact a health care provider if: Your exercise causes pain. Your pain is worse after you exercise. Your pain prevents you from doing your exercises. This information is not intended to replace advice given to you by your health care provider. Make sure you discuss any questions you have with your health care provider. Document Revised: 11/05/2019 Document Reviewed: 06/29/2019 Elsevier Patient Education  [place].  Osteoarthritis  Osteoarthritis is a type of arthritis. It refers to joint pain or joint disease. Osteoarthritis affects tissue that covers the ends of bones in joints (cartilage). Cartilage acts as a cushion between the bones and helps them move smoothly. Osteoarthritis occurs when cartilage in the joints gets worn down. Osteoarthritis is sometimes called "wear and tear" arthritis. Osteoarthritis is the most common form of arthritis. It often occurs in older people. It is a condition that gets worse over time. The joints most  often affected by this condition are in the fingers, toes, hips, knees, and spine, including the neck and lower back. What are the causes? This condition is caused by the wearing down of cartilage that covers the ends of bones. What increases the risk? The following factors may make you more likely to develop this condition: Being age 61 or older. Obesity. Overuse of joints. Past injury of a joint. Past surgery on a joint. Family history of osteoarthritis. What are the signs or symptoms? The main symptoms of this condition are pain, swelling, and stiffness in the joint. Other symptoms may include: An enlarged joint. More pain and further damage caused by small pieces of bone or cartilage that break off and float inside of the joint. Small deposits of bone (osteophytes) that grow on the edges of the joint. A grating or scraping feeling inside the joint when you move it. Popping or creaking sounds when you move. Difficulty walking or exercising. An inability to grip items, twist your hand(s), or control the movements of your hands and fingers. How is this diagnosed? This condition may be diagnosed based on: Your medical history. A physical exam. Your symptoms. X-rays of the affected joint(s). Blood tests to rule out other types of arthritis. How is this treated? There is no cure for this condition, but treatment can help control pain and improve joint function. Treatment may include a combination of therapies, such as: Pain relief techniques, such as: Applying heat and cold to the joint. Massage. A form of talk therapy called cognitive behavioral therapy (CBT). This therapy helps you set goals and follow up on the changes that you make. Medicines for pain and inflammation. The medicines can be taken by mouth or applied to the skin. They include: NSAIDs, such as ibuprofen. Prescription medicines. Strong anti-inflammatory medicines (corticosteroids). Certain nutritional  supplements. A prescribed exercise program. You may work with a physical therapist. Assistive devices, such as a brace, wrap, splint, specialized glove, or cane. A weight control plan. Surgery, such as: An osteotomy. This is done to reposition the bones and relieve pain or to remove loose pieces of bone and cartilage. Joint replacement surgery. You may need this surgery if you have advanced osteoarthritis. Follow these instructions at home: Activity Rest your affected joints as told by your health care provider. Exercise as told  by your health care provider. He or she may recommend specific types of exercise, such as: Strengthening exercises. These are done to strengthen the muscles that support joints affected by arthritis. Aerobic activities. These are exercises, such as brisk walking or water aerobics, that increase your heart rate. Range-of-motion activities. These help your joints move more easily. Balance and agility exercises. Managing pain, stiffness, and swelling     If directed, apply heat to the affected area as often as told by your health care provider. Use the heat source that your health care provider recommends, such as a moist heat pack or a heating pad. If you have a removable assistive device, remove it as told by your health care provider. Place a towel between your skin and the heat source. If your health care provider tells you to keep the assistive device on while you apply heat, place a towel between the assistive device and the heat source. Leave the heat on for 20-30 minutes. Remove the heat if your skin turns bright red. This is especially important if you are unable to feel pain, heat, or cold. You may have a greater risk of getting burned. If directed, put ice on the affected area. To do this: If you have a removable assistive device, remove it as told by your health care provider. Put ice in a plastic bag. Place a towel between your skin and the bag. If your  health care provider tells you to keep the assistive device on during icing, place a towel between the assistive device and the bag. Leave the ice on for 20 minutes, 2-3 times a day. Move your fingers or toes often to reduce stiffness and swelling. Raise (elevate) the injured area above the level of your heart while you are sitting or lying down. General instructions Take over-the-counter and prescription medicines only as told by your health care provider. Maintain a healthy weight. Follow instructions from your health care provider for weight control. Do not use any products that contain nicotine or tobacco, such as cigarettes, e-cigarettes, and chewing tobacco. If you need help quitting, ask your health care provider. Use assistive devices as told by your health care provider. Keep all follow-up visits as told by your health care provider. This is important. Where to find more information [employer] of Arthritis and Musculoskeletal and Skin Diseases: www.niams.SouthExposed.es [employer] on Aging: http://kim-miller.com/ American College of Rheumatology: www.rheumatology.org Contact a health care provider if: You have redness, swelling, or a feeling of warmth in a joint that gets worse. You have a fever along with joint or muscle aches. You develop a rash. You have trouble doing your normal activities. Get help right away if: You have pain that gets worse and is not relieved by pain medicine. Summary Osteoarthritis is a type of arthritis that affects tissue covering the ends of bones in joints (cartilage). This condition is caused by the wearing down of cartilage that covers the ends of bones. The main symptom of this condition is pain, swelling, and stiffness in the joint. There is no cure for this condition, but treatment can help control pain and improve joint function. This information is not intended to replace advice given to you by your health care provider. Make sure you discuss  any questions you have with your health care provider. Document Revised: 01/02/2022 Document Reviewed: 06/29/2019 Elsevier Patient Education  [place].

## 2022-08-14 NOTE — Telephone Encounter (Signed)
     Patient  visit on 08/07/2022  at [facility] ed  was for CHF  Have you been able to follow up with your primary care physician? Patient swelling is receding and patient has followed up with her cardilogist and has no medication needs and has transportation available for upcoing appts  The patient was  able to obtain any needed medicine or equipment.  Are there diet recommendations that you are having difficulty following?  NA   Patient expresses understanding of discharge instructions and education provided has no other needs at this time. Yes    [place] [phone] [address] , [place] [address] Email : [email or web address]

## 2022-08-15 DIAGNOSIS — I10 Essential (primary) hypertension: Secondary | ICD-10-CM | POA: Diagnosis not present

## 2022-08-15 DIAGNOSIS — F32A Depression, unspecified: Secondary | ICD-10-CM

## 2022-08-15 DIAGNOSIS — E1159 Type 2 diabetes mellitus with other circulatory complications: Secondary | ICD-10-CM | POA: Diagnosis not present

## 2022-08-16 DIAGNOSIS — D631 Anemia in chronic kidney disease: Secondary | ICD-10-CM | POA: Diagnosis not present

## 2022-08-16 DIAGNOSIS — N184 Chronic kidney disease, stage 4 (severe): Secondary | ICD-10-CM | POA: Diagnosis not present

## 2022-08-16 DIAGNOSIS — Z87440 Personal history of urinary (tract) infections: Secondary | ICD-10-CM | POA: Diagnosis not present

## 2022-08-16 DIAGNOSIS — Z87891 Personal history of nicotine dependence: Secondary | ICD-10-CM | POA: Diagnosis not present

## 2022-08-16 DIAGNOSIS — E1022 Type 1 diabetes mellitus with diabetic chronic kidney disease: Secondary | ICD-10-CM | POA: Diagnosis not present

## 2022-08-16 DIAGNOSIS — I129 Hypertensive chronic kidney disease with stage 1 through stage 4 chronic kidney disease, or unspecified chronic kidney disease: Secondary | ICD-10-CM | POA: Diagnosis not present

## 2022-08-16 DIAGNOSIS — I340 Nonrheumatic mitral (valve) insufficiency: Secondary | ICD-10-CM | POA: Diagnosis not present

## 2022-08-16 DIAGNOSIS — E559 Vitamin D deficiency, unspecified: Secondary | ICD-10-CM | POA: Diagnosis not present

## 2022-08-16 DIAGNOSIS — N2581 Secondary hyperparathyroidism of renal origin: Secondary | ICD-10-CM | POA: Diagnosis not present

## 2022-08-16 DIAGNOSIS — Z794 Long term (current) use of insulin: Secondary | ICD-10-CM | POA: Diagnosis not present

## 2022-08-21 ENCOUNTER — Ambulatory Visit: Payer: Self-pay | Admitting: Licensed Clinical Social Worker

## 2022-08-21 DIAGNOSIS — F411 Generalized anxiety disorder: Secondary | ICD-10-CM | POA: Diagnosis not present

## 2022-08-21 NOTE — Patient Instructions (Signed)
Visit Information  Thank you for taking time to visit with me today. Please don't hesitate to contact me if I can be of assistance to you before our next scheduled telephone appointment.  Following are the goals we discussed today:   Our next appointment is by telephone on 09/25/22 at 10:00 AM   Please call the care guide team at [phone] if you need to cancel or reschedule your appointment.   If you are experiencing a Mental Health or [place] or need someone to talk to, please go to [facility] [place] [phone])   Following is a copy of your full plan of care:   Interventions  Spoke with  client today about client needs and financial challenges Client said she sometimes has difficulty sleeping due to stress issues Disussed medication procuremenet of client. She said she is concerned about paying for medications each month for her spouse. She said she has been able to obtain her prescribed medications Discussed transport situation of client. She said she has adequate transportation Discussed oxygen use. Client uses oxygen at 2 liters via nasal canula at night to help with breathing Discussed ADLs completion. Client completes ADLs. She is sometimes fatigued afer doing certain ADLs  Regarding food supply,  client said she receives monthly food stamps benefit. Client received from [place] a list of local Food [place] and has this list of Food Banks to use as needed. Discussed mood of client . Client is taking medications as prescribed; but, dealing with stress issues is difficult for client at present Discussed mobility. Client has ramp at her home to use as needed; however, inside home she has 13 steps she uses to go from one floor to another. Client is at risk for falls. Client uses a cane to help her ambulate Discussed client care with medical specialists. She said she sees two heart specialists. Client has  appointment with her PCP on August 30, 2022 Provided counseling support for client Zenora said her spouse has been hired for new job but training will not start until June or July of 2024. She said they are trying to pay monthly bills on schedule and maintain their finances until new job of spouse begins  Ms. Sypher was given information about Care Management services by the embedded care coordination team including:  Care Management services include personalized support from designated clinical staff supervised by her physician, including individualized plan of care and coordination with other care providers 24/7 contact phone numbers for assistance for urgent and routine care needs. The patient may stop CCM services at any time (effective at the end of the month) by phone call to the office staff.  Patient agreed to services and verbal consent obtained.   Norva Riffle.Timothey Dahlstrom MSW, [place] representative [facility] Care Management [phone]

## 2022-08-21 NOTE — Patient Outreach (Signed)
  Care Coordination   Follow Up Visit Note   08/21/2022 Name: Dana [name] MRN: [id number] DOB: Aug 09, 1960  Dana [name] is a 62 y.o. year old female who sees Cox, Kirsten, MD for primary care. I spoke with  Dana [name] by phone today.  What matters to the patients health and wellness today?  Patient wants to talk with LCSW about managing stress and about dealing with financial challenges    Goals Addressed               This Visit's Progress     patient would like to talk with LCSW about managing stress especially dealing with financial challenges (pt-stated)        Interventions  Spoke with  client today about client needs and financial challenges Client said she sometimes has difficulty sleeping due to stress issues Disussed medication procuremenet of client. She said she is concerned about paying for medications each month for her spouse. She said she has been able to obtain her prescribed medications Discussed transport situation of client. She said she has adequate transportation Discussed oxygen use. Client uses oxygen at 2 liters via nasal canula at night to help with breathing Discussed ADLs completion. Client completes ADLs. She is sometimes fatigued afer doing certain ADLs  Regarding food supply,  client said she receives monthly food stamps benefit. Client received from [place] a list of local Food Dana [name] and has this list of Food Banks to use as needed. Discussed mood of client . Client is taking medications as prescribed; but, dealing with stress issues is difficult for client at present Discussed mobility. Client has ramp at her home to use as needed; however, inside home she has 13 steps she uses to go from one floor to another. Client is at risk for falls. Client uses a cane to help her ambulate Discussed client care with medical specialists. She said she sees two heart specialists. Client has appointment with her PCP on August 30, 2022 Provided counseling  support for client Dana [name] said her spouse has been hired for new job but training will not start until June or July of 2024. She said they are trying to pay monthly bills on schedule and maintain their finances until new job of spouse begins      SDOH assessments and interventions completed:  Yes  SDOH Interventions Today    Flowsheet Row Most Recent Value  SDOH Interventions   Depression Interventions/Treatment  Medication, Counseling  Financial Strain Interventions Other (Comment)  [Financial strain. Spouse is not working at present.]  Physical Activity Interventions Other (Comments)  [uses cane to help her in walking]        Care Coordination Interventions:  Yes, provided   Follow up plan: Follow up call scheduled for 09/25/22 at 10:00 AM    Encounter Outcome:  Pt. Visit Completed   Dana [name].Keirstyn Aydt MSW, [place] Holiday representative [facility] Care Management [phone]

## 2022-08-23 ENCOUNTER — Ambulatory Visit: Payer: PPO

## 2022-08-27 ENCOUNTER — Inpatient Hospital Stay: Payer: PPO | Attending: Hematology and Oncology

## 2022-08-27 VITALS — BP 131/54 | HR 73 | Temp 97.5°F | Resp 18 | Ht 64.5 in | Wt 229.8 lb

## 2022-08-27 DIAGNOSIS — D631 Anemia in chronic kidney disease: Secondary | ICD-10-CM | POA: Diagnosis not present

## 2022-08-27 DIAGNOSIS — N1832 Chronic kidney disease, stage 3b: Secondary | ICD-10-CM | POA: Insufficient documentation

## 2022-08-27 MED ORDER — EPOETIN ALFA-EPBX 20000 UNIT/ML IJ SOLN
20000.0000 [IU] | Freq: Once | INTRAMUSCULAR | Status: AC
  Administered 2022-08-27: 20000 [IU] via SUBCUTANEOUS
  Filled 2022-08-27: qty 1

## 2022-08-27 NOTE — Patient Instructions (Signed)

## 2022-08-29 ENCOUNTER — Other Ambulatory Visit: Payer: Self-pay | Admitting: Family Medicine

## 2022-08-30 ENCOUNTER — Encounter (HOSPITAL_COMMUNITY): Payer: Self-pay | Admitting: Internal Medicine

## 2022-08-30 ENCOUNTER — Ambulatory Visit (HOSPITAL_COMMUNITY)
Admission: RE | Admit: 2022-08-30 | Discharge: 2022-08-30 | Disposition: A | Discharge status: Home / Self Care | Payer: PPO | Source: Ambulatory Visit | Attending: Internal Medicine | Admitting: Internal Medicine

## 2022-08-30 VITALS — BP 104/60 | HR 70 | Wt 233.4 lb

## 2022-08-30 DIAGNOSIS — Z794 Long term (current) use of insulin: Secondary | ICD-10-CM | POA: Diagnosis not present

## 2022-08-30 DIAGNOSIS — I5022 Chronic systolic (congestive) heart failure: Secondary | ICD-10-CM

## 2022-08-30 DIAGNOSIS — E1022 Type 1 diabetes mellitus with diabetic chronic kidney disease: Secondary | ICD-10-CM | POA: Diagnosis not present

## 2022-08-30 DIAGNOSIS — I10 Essential (primary) hypertension: Secondary | ICD-10-CM | POA: Diagnosis not present

## 2022-08-30 DIAGNOSIS — Z9889 Other specified postprocedural states: Secondary | ICD-10-CM | POA: Diagnosis not present

## 2022-08-30 DIAGNOSIS — I5042 Chronic combined systolic (congestive) and diastolic (congestive) heart failure: Secondary | ICD-10-CM | POA: Diagnosis not present

## 2022-08-30 DIAGNOSIS — Z79899 Other long term (current) drug therapy: Secondary | ICD-10-CM | POA: Insufficient documentation

## 2022-08-30 DIAGNOSIS — I2510 Atherosclerotic heart disease of native coronary artery without angina pectoris: Secondary | ICD-10-CM | POA: Insufficient documentation

## 2022-08-30 DIAGNOSIS — N179 Acute kidney failure, unspecified: Secondary | ICD-10-CM | POA: Diagnosis not present

## 2022-08-30 DIAGNOSIS — I480 Paroxysmal atrial fibrillation: Secondary | ICD-10-CM | POA: Diagnosis not present

## 2022-08-30 DIAGNOSIS — G4733 Obstructive sleep apnea (adult) (pediatric): Secondary | ICD-10-CM | POA: Diagnosis not present

## 2022-08-30 DIAGNOSIS — I5032 Chronic diastolic (congestive) heart failure: Secondary | ICD-10-CM | POA: Diagnosis not present

## 2022-08-30 DIAGNOSIS — D62 Acute posthemorrhagic anemia: Secondary | ICD-10-CM | POA: Diagnosis not present

## 2022-08-30 DIAGNOSIS — I252 Old myocardial infarction: Secondary | ICD-10-CM | POA: Diagnosis not present

## 2022-08-30 DIAGNOSIS — Z955 Presence of coronary angioplasty implant and graft: Secondary | ICD-10-CM | POA: Diagnosis not present

## 2022-08-30 DIAGNOSIS — K3184 Gastroparesis: Secondary | ICD-10-CM | POA: Diagnosis not present

## 2022-08-30 DIAGNOSIS — N1832 Chronic kidney disease, stage 3b: Secondary | ICD-10-CM | POA: Diagnosis not present

## 2022-08-30 DIAGNOSIS — R002 Palpitations: Secondary | ICD-10-CM | POA: Insufficient documentation

## 2022-08-30 DIAGNOSIS — Z95818 Presence of other cardiac implants and grafts: Secondary | ICD-10-CM

## 2022-08-30 DIAGNOSIS — I340 Nonrheumatic mitral (valve) insufficiency: Secondary | ICD-10-CM | POA: Insufficient documentation

## 2022-08-30 DIAGNOSIS — E1043 Type 1 diabetes mellitus with diabetic autonomic (poly)neuropathy: Secondary | ICD-10-CM | POA: Diagnosis not present

## 2022-08-30 DIAGNOSIS — R040 Epistaxis: Secondary | ICD-10-CM | POA: Diagnosis not present

## 2022-08-30 LAB — BASIC METABOLIC PANEL
Anion gap: 9 (ref 5–15)
BUN: 70 mg/dL — ABNORMAL HIGH (ref 6–20)
CO2: 28 mmol/L (ref 22–32)
Calcium: 9 mg/dL (ref 8.9–10.3)
Chloride: 101 mmol/L (ref 98–111)
Creatinine, Ser: 1.88 mg/dL — ABNORMAL HIGH (ref 0.44–1.00)
GFR, Estimated: 30 mL/min — ABNORMAL LOW (ref >60)
Glucose, Bld: 123 mg/dL — ABNORMAL HIGH (ref 70–99)
Potassium: 3.7 mmol/L (ref 3.5–5.1)
Sodium: 138 mmol/L (ref 135–145)

## 2022-08-30 LAB — BRAIN NATRIURETIC PEPTIDE: B Natriuretic Peptide: 103.7 pg/mL — ABNORMAL HIGH (ref 0.0–100.0)

## 2022-08-30 NOTE — Patient Instructions (Signed)
There has been no changes to your medications.  ONCE WE FIND OUT YOUR CO-PAY FOR FUROSCIX WE WILL REACH OUT TO YOU AND GIVE YOU THE REQUIRED INSTRUCTIONS OF HOW TO ADMINISTER THE DRUG.  Labs done today, your results will be available in MyChart, we will contact you for abnormal readings.  Your physician recommends that you schedule a follow-up appointment in: 4 months  If you have any questions or concerns before your next appointment please send [name] a message through [place] or call our office at [phone].    TO LEAVE A MESSAGE FOR THE NURSE SELECT OPTION 2, PLEASE LEAVE A MESSAGE INCLUDING: YOUR NAME DATE OF BIRTH CALL BACK NUMBER REASON FOR CALL**this is important as we prioritize the call backs  YOU WILL RECEIVE A CALL BACK THE SAME DAY AS LONG AS YOU CALL BEFORE 4:00 PM  At the [facility], you and your health needs are our priority. As part of our continuing mission to provide you with exceptional heart care, we have created designated Provider Care Teams. These Care Teams include your primary Cardiologist (physician) and Advanced Practice Providers (APPs- Physician Assistants and Nurse Practitioners) who all work together to provide you with the care you need, when you need it.   You may see any of the following providers on your designated Care Team at your next follow up: Dr [name] Dr Loralie Champagne Dr. Roxana Hires, NP Lyda Jester, [facility] [place], [place] Forestine Na, NP Audry Riles, PharmD   Please be sure to bring in all your medications bottles to every appointment.    Thank you for choosing [facility]

## 2022-08-30 NOTE — Progress Notes (Signed)
Advanced Heart Failure Clinic Note   PCP: [name], MD PCP-Cardiologist: [name], MD   Reason for Visit: f/u chronic diastolic HF  HPI: Dana [name] is 62 y.o. female with history of diastolic heart failure s/p cardiomems 02/2018, CAD, s/p DES RCA and LAD 2018, DMI, htn, hyperlipidemia, PAD, and OSA on CPAP, PAF s/p ablation, PAD s/p L great toe and 4th toe amputation, stage IIIb CKD.   Admitted 03/22 with NSTEMI and a/c CHF. LHC 03/22 showed moderate mid-LAD and mid-RCA disease, severe mid-L-Cx stenosis s/p PCI/DES mid Cx and side branch angioplasty of OM1.   Unfortunately she developed a contrast-induced nephropathy after cath and creatinine increased from 2.2 to a peak of 5.5. Additionally, she developed severe nausea and vomiting, gastroparesis and required IV antiplatelet therapy.  During this time she developed epistaxis and acute blood loss anemia requiring transfusion.  She was switched back to oral DAPT. Diuretics were reintroduced and her creatinine began to improve over the following days. Her leukocytosis persisted but all her infectious workup was negative and this was felt to be reactive.    Per chart review, she was admitted 6/22 to [facility] for DKA, started on insulin gtt, IVF and diuretics held. Developed pulmonary vascular congestion vs PNA and was started on IV abx, and diuretics were resumed. She suffered a PEA arrest 01/12/21 with ROSC after 10-15 minutes of CPR and was transferred to [facility]; precipitating factor for PEA arrest thought to be respiratory driven. On arrival, she was still in DKA, AKI on CKD with oliguria and Nephrology was consulted for further management. TTE with EF 60%, min MR/TR. Concern for stroke due to left-sided weakness and MRI confirmed anoxic brain injury, but no definite infarct. She was extubated, SCr returned to baseline of 1.6-1.8 and she was discharged to rehab facility.   Zio placed 8/22 for palpitations, showing mostly SB/SR no afib, 66  SVT runs, frequent isolated SVE (11.2%).  Admitted from AHF clinic 10/22 for A/C CHF after failing outpatient escalation in diuretic therapy. She was enrolled in FASTR trial and diuresed > 15 lb.  Hospitalization complicated by AKI on CKD, Scr up to 3.13. RHC showed mild to moderate elevating filling pressures with large v-waves in PCWP tracing suggesting severe MR vs severe diastolic dysfunction. TEE 55-60% showed severe central MR.   Patient underwent TEER of mitral valve with placement of 1XT W and 1NT W clip on 05/18/2021. NT W clip was entangled and had to be deployed into lateral part of valve. No additional clip placement could be preformed. Echo 11/04 with EF 50-55% and residual moderate MR. Given 1 dose IV lasix prior to discharge.  Echo 3/23 showed EF 55-60%, RV OK, moderate MR mean gradient 7-8 mmHg. Saw [name] and MR now moderate, no indication for 3rd clip.   Echo 07/18/22 reviewed by [facility] 07/18/22. LVEF 55-60%, normal RV and moderate MR, mG 7.5 mmHg, essentially unchanged from prior echo.   Here for f/u with her husband. Cardiomems stable PAD running 10-12 (goal 10). Feel pretty good. No CP. Having some edema but takes metolazone as needed. Able to do ADLs without too much problem. No CP, orthopnea or PND.    Cardiac Studies: - Echo (1/24): EF 55-60%, normal RV and moderate MR, mG 7.5 mmHg, essentially unchanged from prior echo.   - Echo (3/23): EF 55-60%, RV normal, moderate MR  - Echo 11/22: EF 50-55%, residual moderate MR  - RHC (10/22):   RA = 8 RV = 59/9  PA = 61/17 (35) PCW = mean 23 (v = 49) -> performed multiple wedges Fick cardiac output/index = PVR = 1.6 WU FA sat = 98% PA sat = 69%, 70% PaPi = 5.5 Assessment: 1. Mild to moderately elevated filling pressures with very large v-waves in PCWP tracing suggestive of severe diastolic dysfunction vs severe MR  - TEE (10/22): EF 55-60%, basal inferior akinesis, severe central MR. - Echo (10/22):  EF 65-70%, RV ok, moderate MR - Echo (5/21): EF 60-65%, RV ok, Grade II DD. - Echo (2/22): EF 55-60%, RV ok, Grade II DD, mod MR - Echo (3/22): EF 55-60%, Grade II DD, normal RV - Echo (6/22): EF 60%, mod MR/TR (per chart review at [facility]). - Zio (8/19): no arrhythmias, occasional PAC/PVCs - Zio (9/22): mostly SB/SR no afib, 66 SVT runs, frequent isolated SVE (11.2%).  - LHC (3/22): 3 vessel CAD with moderate mid-LAD and mid-RCA stenoses, and severe mid-LCx stenosis; successful PCI of the mid-circumflex, reducing severe 90% stenosis to 0% with a 2.75x22 mm Resolute Onyx DES, provisional side branch angioplasty of the OM1 with a 2.5 mm balloon through the stent struts  Review of systems complete and found to be negative unless listed in HPI.    Past Medical History:  Diagnosis Date   Acquired absence of left great toe (HCC)    Acquired absence of other left toe(s) ([place])    ACS (acute coronary syndrome) ([place])    Anemia    Anoxic brain injury ([place])    Anxiety    CAD (coronary artery disease)    DES mLAD 04/07/15; DES dRCA & DES pPDA 08/30/16; DES mCX & PTCA OM1 09/29/20   Chronic diastolic (congestive) heart failure (HCC)    Chronic pain    Chronic systolic (congestive) heart failure (HCC)    CKD (chronic kidney disease)    Contrast dye induced nephropathy, possible 09/03/2020   COPD (chronic obstructive pulmonary disease) (HCC)    Diabetes (HCC)    Diastolic CHF (HCC)    Dyspnea    Family history of breast cancer 10/23/2021   Gastro-esophageal reflux disease without esophagitis    Hyperlipidemia    Hypertension    Hypertensive heart disease with heart failure (HCC)    Hypoxemia    Idiopathic gout, multiple sites    Leukocytosis 03/29/2022   Localized edema    Low back pain    Mitral regurgitation    Mitral regurgitation    Mixed hyperlipidemia    Mixed incontinence    Morbid (severe) obesity due to excess calories (HCC)    Neuromuscular disorder (HCC)    Neuropathy    Non-ST  elevation (NSTEMI) myocardial infarction ([place])    08/29/20, 09/29/20   OSA (obstructive sleep apnea) 09/03/2020   Other specified hypothyroidism    PAF (paroxysmal atrial fibrillation) (HCC)    s/p pulmonary vein isolation by cryoablation 10/04/15 Dr. Minna Merritts in [facility]   PEA (Pulseless electrical activity) [facility]    ~ 01/13/21 at [facility] during admission DKA, volume overload, possible CAP, hypoxia s/p ACLS with ROSC ~ 10-15   Pneumonia    Primary insomnia    Rheumatoid arthritis ([place])    Stroke ([place])    Thyroid disease    Type 2 diabetes mellitus with diabetic nephropathy (HCC)    Vitamin D deficiency     Current Outpatient Medications  Medication Sig Dispense Refill   allopurinol (ZYLOPRIM) 300 MG tablet Take 1 tablet (300 mg total) by mouth daily. 90 tablet 1  busPIRone (BUSPAR) 5 MG tablet Take 1 tablet (5 mg total) by mouth 3 (three) times daily. 270 tablet 1   carvedilol (COREG) 12.5 MG tablet TAKE ONE TABLET BY MOUTH TWICE DAILY 180 tablet 3   clopidogrel (PLAVIX) 75 MG tablet Take 1 tablet (75 mg total) by mouth daily. 90 tablet 1   Continuous Blood Gluc Sensor (FREESTYLE LIBRE 2 SENSOR) MISC USE TO check blood glucose AS DIRECTED AND CHANGE sensor every 14 DAYS     CRANBERRY PO Take 2 tablets by mouth daily. 2 gummies     EPINEPHrine 0.3 mg/0.3 mL IJ SOAJ injection Use as directed for life-threatening allergic reaction. 4 Device 3   Evolocumab (REPATHA SURECLICK) [id number] MG/ML SOAJ Inject 1 Dose into the skin every 14 (fourteen) days. 6 mL 1   ezetimibe (ZETIA) 10 MG tablet Take 1 tablet (10 mg total) by mouth daily. 90 tablet 3   hydrALAZINE (APRESOLINE) 25 MG tablet Take 1 tablet (25 mg total) by mouth 3 (three) times daily. 90 tablet 6   HYDROcodone-acetaminophen (NORCO) 7.5-325 MG tablet TAKE ONE TABLET BY MOUTH THREE TIMES DAILY AS NEEDED 90 tablet 0   insulin aspart (NOVOLOG FLEXPEN) 100 UNIT/ML FlexPen 2-10 U before meals based on SSI. 15 mL 3   Insulin Pen Needle (BD  PEN NEEDLE NANO 2ND GEN) 32G X 4 MM MISC Inject 1 each into the skin in the morning, at noon, in the evening, and at bedtime. 600 each 3   isosorbide mononitrate (IMDUR) 30 MG 24 hr tablet Take 1 tablet (30 mg total) by mouth daily. 90 tablet 1   lactulose (CHRONULAC) 10 GM/15ML solution Take 15 mLs (10 g total) by mouth daily as needed for mild constipation. 236 mL 1   latanoprost (XALATAN) 0.005 % ophthalmic solution Place 1 drop into both eyes at bedtime.     LEVEMIR FLEXPEN 100 UNIT/ML FlexPen Inject 50 units into THE SKIN daily 15 mL 1   LORazepam (ATIVAN) 0.5 MG tablet TAKE ONE TABLET BY MOUTH ONCE daily AS NEEDED FOR ANXIETY 30 tablet 2   magnesium oxide (MAG-OX) 400 MG tablet Take 2 tablets (800 mg total) by mouth daily. Take 2 (400 mg) daily=800 mg 60 tablet 1   metolazone (ZAROXOLYN) 2.5 MG tablet Take 1 tablet (2.5 mg total) by mouth as needed. 3 tablet 0   nitroGLYCERIN (NITROSTAT) 0.4 MG SL tablet Dissolve 1 tab under tongue as needed for chest pain. May repeat every 5 minutes x 2 doses. If no relief call 9-1-1. Please call [phone] to schedule an appointment for future refills. Thank you. 50 tablet 0   Omega-3 Fatty Acids (FISH OIL PO) Take 1 capsule by mouth daily.     OXYGEN Inhale 2 L into the lungs at bedtime.     pantoprazole (PROTONIX) 40 MG tablet Take 1 tablet (40 mg total) by mouth daily. 90 tablet 1   potassium chloride SA (KLOR-CON M) 20 MEQ tablet TAKE ONE TABLET BY MOUTH ONCE daily 90 tablet 1   Probiotic CAPS Take 1 capsule by mouth daily.     promethazine (PHENERGAN) 25 MG tablet TAKE ONE TABLET BY MOUTH daily AS NEEDED 30 tablet 3   torsemide (DEMADEX) 20 MG tablet TAKE 4 TABLETS BY MOUTH ONCE DAILY 336 tablet 3   No current facility-administered medications for this encounter.   Allergies  Allergen Reactions   Naproxen Hives and Rash   Celecoxib Other (See Comments)    Stomach pain   Aspirin Other (See Comments)  CAN NOT TAKE DUE TO ULCERS (documented  previously) but takes every day currently (as of 09/29/20) without reported issues Can take coated aspirin   Glucosamine Hives, Swelling and Other (See Comments)    Angioedema    Iron     IV iron transfusion - hives - Feb 2022 hospitalization   Metoclopramide Other (See Comments)    Facial twitching and stuttering   Metolazone Other (See Comments)    Acute renal failure   Other Hives    Strawberry allergy   Shellfish-Derived Products Hives, Swelling and Other (See Comments)    Angioedema    Statins Other (See Comments)    Muscle pain   Strawberry (Diagnostic)    Strawberry Extract    Strawberry Flavor    Strawberry Leaves Extract    Tramadol Nausea And Vomiting        Social History   Socioeconomic History   Marital status: Married    Spouse name: Dana [name]   Number of children: 2   Years of education: 16   Highest education level: Master's degree (e.g., MA, Dana, MEng, MEd, MSW, MBA)  Occupational History   Occupation: on disability/retired early former Education officer, museum  Tobacco Use   Smoking status: Former    Types: Cigarettes    Start date: 1983    Quit date: 1984    Years since quitting: 40.1    Passive exposure: Past   Smokeless tobacco: Never  Vaping Use   Vaping Use: Never used  Substance and Sexual Activity   Alcohol use: Never   Drug use: Never   Sexual activity: Not on file  Other Topics Concern   Not on file  Social History Narrative   Not on file   Social Determinants of Health   Financial Resource Strain: Medium Risk (08/21/2022)   Overall Financial Resource Strain (CARDIA)    Difficulty of Paying Living Expenses: Somewhat hard  Food Insecurity: Food Insecurity Present (07/26/2022)   Hunger Vital Sign    Worried About Running Out of Food in the Last Year: Sometimes true    Ran Out of Food in the Last Year: Sometimes true  Transportation Needs: No Transportation Needs (07/25/2022)   PRAPARE - Hydrologist (Medical): No     Lack of Transportation (Non-Medical): No  Physical Activity: Inactive (08/21/2022)   Exercise Vital Sign    Days of Exercise per Week: 0 days    Minutes of Exercise per Session: 0 min  Stress: Stress Concern Present (08/01/2022)   [place]    Feeling of Stress : To some extent  Social Connections: Moderately Integrated (07/25/2022)   Social Connection and Isolation Panel [NHANES]    Frequency of Communication with Friends and Family: More than three times a week    Frequency of Social Gatherings with Friends and Family: More than three times a week    Attends Religious Services: More than 4 times per year    Active Member of [employer] or Organizations: No    Attends Archivist Meetings: Never    Marital Status: Married  Human resources officer Violence: Not At Risk (07/25/2022)   Humiliation, Afraid, Rape, and Kick questionnaire    Fear of Current or Ex-Partner: No    Emotionally Abused: No    Physically Abused: No    Sexually Abused: No   Family History  Problem Relation Age of Onset   Breast cancer Mother 15  contralateral breast ca dx 85   Coronary artery disease Mother    Hypertension Mother    Hyperlipidemia Mother    Diabetes type II Mother    Lung cancer Mother 24   Diabetes Father    Hypertension Father    Mental illness Sister    Bipolar disorder Other    Depression Other    Schizophrenia Other    Cancer Other        MGF's sisters x2; unknown type   BP 104/60   Pulse 70   Wt 105.9 kg (233 lb 6.4 oz)   SpO2 97%   BMI 39.44 kg/m   Wt Readings from Last 3 Encounters:  08/30/22 105.9 kg (233 lb 6.4 oz)  08/27/22 104.2 kg (229 lb 12 oz)  08/14/22 105.7 kg (233 lb)   PHYSICAL EXAM: General:  Well appearing. No resp difficulty HEENT: normal Neck: supple. no JVD. Carotids 2+ bilat; no bruits. No lymphadenopathy or thryomegaly appreciated. Cor: PMI nondisplaced. Regular rate & rhythm. 2/6  MR Lungs: clear Abdomen: obese soft, nontender, nondistended. No hepatosplenomegaly. No bruits or masses. Good bowel sounds. Extremities: no cyanosis, clubbing, rash, edema Neuro: alert & orientedx3, cranial nerves grossly intact. moves all 4 extremities w/o difficulty. Affect pleasant  CardioMEMs: Goal 10. Elevated today at 12 Personally reviewed   ASSESSMENT & PLAN:  1.  Acute on Chronic Diastolic Heart Failure - Echo 08/2020 EF 55-60%  - Echo (3/22): EF 55-60%, Grade II DD, normal RV - Echo (6/22): EF 60%, mod MR/TR (per chart review at [facility]). - Admit 10/22 with a/c CHF, enrolled in FASTR trial and diuresed 15 lbs. Severe MR likely contributing to decompensation.  - RHC (10/22): mild to moderate elevated filling pressures, large v-waves PCWP tracing - TEE (10/22): EF 55-60%, basal inferior akinesis, severe central MR.>>Underwent TEER>>mod residual MR - Echo (11/22): EF 50-55%, severe LV HK, moderate MR - Echo (3/23): EF 55-60%, RV OK, moderate MR mean gradient 7-8 mmHg - Echo (1/24): EF 55-60%, RV normal, mod MR mG 7.5 mmHg  - Stable NYHA II-III. Volume status slightly elevated on CardioMEMs (dPAP 12, goal 10)  - Continue Torsemide 80 mg daily currently using metolazone as needed - Will give her Furoscix to use PRN to help deal with fluid overload and try to minimize metolazone use and related episodes of AKI - Continue hydralazine 25 mg tid. - Continue carvedilol 12.5 mg bid. - Continue Imdur 30 mg daily. - No SGLT2i with T1DM. - Continue TED hose. - Refer to CR at [facility] today    2. CAD - Known CAD with prior stents to mid LAD and pRCA.  - NSTEMI (2/22). Cath deferred due to AKI. Treated medically.  - NSTEMI (7/22)-->S/p PCI/DES to mid Cx, balloon angioplasty to OM1  - No current s/s anigina - Continue ASA + Plavix for now.  - Lipids under good control w/ Repatha + Zetia Followed by Dr. Debara Pickett.    3. CKD IIIb - Prior h/o CIN following LHC - Previous SCr  baseline 1.6-1.8 with multiple AKIs over the last year. New baseline ~ 2.0-2.5 - Followed by Nephrology in [facility] - Recent labs reviewed and are stable with Scr 2.18 on 08/09/22 - repeat labs today - Will try to get Furoscix to use PRN and limit metolazone use  4. Mitral regurgitation - Severe central MR on TEE on 10/22.   - S/p TEER 05/18/2021 - two clips placed, one became entangled and had to be deployed. - Moderate  residual MR on echo 11/22. - Recent echo 1/24 with stable moderate MR, mG 7.5 mmHg  - Structural team following. No indication for re-clip   5. OSA - Continue CPAP qhs.    6. DM1 - Hgb A1c 6.6 10/22. - No SGLT2i.   7. Chronic Anemia - Baseline ~10  felt to be 2/2 renal disease.  - Hx severe epistaxis and ABLA requiring transfusion. Hx rash with IV iron. - Following with Heme/Onc. Receiving Retacrit monthly.  8. Palpitations - Hx PAF s/p ablation at least 10 years ago. No documented recurrence. Not currently on anticoagulation. Would need to consider risk/benefits of anticoagulation if recurs especially with history of anemia requiring transfusion - Zio XT 14 day (8/22) showed mostly SB/SR no afib, 66 SVT runs, frequent isolated SVE (11.2%). - Symptomatically much improved - Continue carvedilol 12.5 mg bid.   [name], MD  12:03 PM

## 2022-08-30 NOTE — Progress Notes (Signed)
Subjective:  Patient ID: Dana [name], female    DOB: Sep 11, 1960  Age: 62 y.o. MRN: [id number]  Chief Complaint  Patient presents with  . Hyperlipidemia  . Diabetes    HPI Diabetes: Managed by Dr. Tamala Julian Complications: neuropathy.  Glucose checking:free style libre  Glucose logs:200s Hypoglycemia: 60s  about 4 times a month.  Most recent A1C: 8.2 Current medications: Levermir 50 units daily, Novolog Ssi before meals.  Last Eye Exam: 11/2021 Foot checks: daily  Hyperlipidemia: Current medications: Repatha 140 mg weekly, Zetia 10 mg daily, fish oil 1000 mg daily.   Depression: buspar 5 mg three times a day and lorazepam 0.5 mg once daily,   Hypertension: Complications: chf, cad, ckd Current medications: Carvedilol 12.5 mg three times daily.hydralazine 25 mg three times a day, imdur 30 mg daily, torsemide 20 mg 4 daily, potassium 20 meq once daily, plavix 75 mg qd, Saw Dr. Evalina Field yesterday at [place] [facility]. Has ntg on hand. Had to take on 08/09/2022. Had to go to ED and had IV lasix. No AMI.  Hypomagnesium: mag oxide 400 mg 2 daily.   GOUT: allopurinol 300 mg once daily.  Complaining of joint pain in hands and feet. Patient scheduled to see Dr Estanislado Pandy in January 2024. GERD: protonix 40 mg once daily.   B12 deficiency:  B12 gummies 1200 mg daily.  Diet: healthy Exercise: chair  Chronic respiratory failure with hypoxia and OSA: On cpap and 2 L oxygen at night.  GAD: On buspirone 5 mg three times a day and lorazepam 0.5 mg daily as needed.   Chronic back pain: on norco 7.5/325 mg once three times a day  as needed.   Constipation: requesting to try linzess.     08/31/2022    7:40 AM 08/21/2022    9:46 AM 08/01/2022   10:34 AM 04/25/2022    7:58 AM 03/07/2022   11:18 AM  Depression screen PHQ 2/9  Decreased Interest 1 1 1 $ 0 1  Down, Depressed, Hopeless 1 1 1 1 1  $ PHQ - 2 Score 2 2 2 1 2  $ Altered sleeping 2 1 1 3 $ 0  Tired, decreased energy 3 2 3 3  3  $ Change in appetite 1 0 0 0 0  Feeling bad or failure about yourself  0 1 1 0 3  Trouble concentrating 0 1 1 0 0  Moving slowly or fidgety/restless 1 1 1 $ 0 0  Suicidal thoughts 0 0 0 0 0  PHQ-9 Score 9 8 9 7 8  $ Difficult doing work/chores  Somewhat difficult Somewhat difficult Somewhat difficult Not difficult at all         06/14/2022   11:05 AM 06/28/2022   11:39 AM 07/12/2022   11:38 AM 07/19/2022   10:43 AM 07/26/2022   10:27 AM  Fall Risk  (RETIRED) Patient Fall Risk Level High fall risk High fall risk High fall risk High fall risk High fall risk      Review of Systems  Constitutional:  Negative for chills, fatigue and fever.  HENT:  Negative for congestion, rhinorrhea and sore throat.   Respiratory:  Negative for cough and shortness of breath.   Cardiovascular:  Positive for leg swelling. Negative for chest pain.  Gastrointestinal:  Positive for constipation, nausea and vomiting. Negative for abdominal pain and diarrhea.  Genitourinary:  Negative for dysuria and urgency.  Musculoskeletal:  Negative for back pain and myalgias.  Neurological:  Negative for dizziness, weakness, light-headedness and headaches.  Psychiatric/Behavioral:  Positive for dysphoric mood. The patient is not nervous/anxious.     Current Outpatient Medications on File Prior to Visit  Medication Sig Dispense Refill  . allopurinol (ZYLOPRIM) 300 MG tablet Take 1 tablet (300 mg total) by mouth daily. 90 tablet 1  . busPIRone (BUSPAR) 5 MG tablet Take 1 tablet (5 mg total) by mouth 3 (three) times daily. 270 tablet 1  . carvedilol (COREG) 12.5 MG tablet TAKE ONE TABLET BY MOUTH TWICE DAILY 180 tablet 3  . clopidogrel (PLAVIX) 75 MG tablet Take 1 tablet (75 mg total) by mouth daily. 90 tablet 1  . Continuous Blood Gluc Sensor (FREESTYLE LIBRE 2 SENSOR) MISC USE TO check blood glucose AS DIRECTED AND CHANGE sensor every 14 DAYS    . CRANBERRY PO Take 2 tablets by mouth daily. 2 gummies    . EPINEPHrine 0.3  mg/0.3 mL IJ SOAJ injection Use as directed for life-threatening allergic reaction. 4 Device 3  . Evolocumab (REPATHA SURECLICK) [id number] MG/ML SOAJ Inject 1 Dose into the skin every 14 (fourteen) days. 6 mL 1  . ezetimibe (ZETIA) 10 MG tablet Take 1 tablet (10 mg total) by mouth daily. 90 tablet 3  . hydrALAZINE (APRESOLINE) 25 MG tablet Take 1 tablet (25 mg total) by mouth 3 (three) times daily. 90 tablet 6  . HYDROcodone-acetaminophen (NORCO) 7.5-325 MG tablet TAKE ONE TABLET BY MOUTH THREE TIMES DAILY AS NEEDED 90 tablet 0  . insulin aspart (NOVOLOG FLEXPEN) 100 UNIT/ML FlexPen 2-10 U before meals based on SSI. 15 mL 3  . Insulin Pen Needle (BD PEN NEEDLE NANO 2ND GEN) 32G X 4 MM MISC Inject 1 each into the skin in the morning, at noon, in the evening, and at bedtime. 600 each 3  . isosorbide mononitrate (IMDUR) 30 MG 24 hr tablet Take 1 tablet (30 mg total) by mouth daily. 90 tablet 1  . lactulose (CHRONULAC) 10 GM/15ML solution Take 15 mLs (10 g total) by mouth daily as needed for mild constipation. 236 mL 1  . latanoprost (XALATAN) 0.005 % ophthalmic solution Place 1 drop into both eyes at bedtime.    Marland Kitchen LEVEMIR FLEXPEN 100 UNIT/ML FlexPen Inject 50 units into THE SKIN daily 15 mL 1  . LORazepam (ATIVAN) 0.5 MG tablet TAKE ONE TABLET BY MOUTH ONCE daily AS NEEDED FOR ANXIETY 30 tablet 2  . magnesium oxide (MAG-OX) 400 MG tablet Take 2 tablets (800 mg total) by mouth daily. Take 2 (400 mg) daily=800 mg 60 tablet 1  . metolazone (ZAROXOLYN) 2.5 MG tablet Take 1 tablet (2.5 mg total) by mouth as needed. 3 tablet 0  . nitroGLYCERIN (NITROSTAT) 0.4 MG SL tablet Dissolve 1 tab under tongue as needed for chest pain. May repeat every 5 minutes x 2 doses. If no relief call 9-1-1. Please call [phone] to schedule an appointment for future refills. Thank you. 50 tablet 0  . Omega-3 Fatty Acids (FISH OIL PO) Take 1 capsule by mouth daily.    . OXYGEN Inhale 2 L into the lungs at bedtime.    . pantoprazole  (PROTONIX) 40 MG tablet Take 1 tablet (40 mg total) by mouth daily. 90 tablet 1  . potassium chloride SA (KLOR-CON M) 20 MEQ tablet TAKE ONE TABLET BY MOUTH ONCE daily 90 tablet 1  . Probiotic CAPS Take 1 capsule by mouth daily.    . promethazine (PHENERGAN) 25 MG tablet TAKE ONE TABLET BY MOUTH daily AS NEEDED 30 tablet 3  . torsemide (DEMADEX) 20 MG tablet  TAKE 4 TABLETS BY MOUTH ONCE DAILY 336 tablet 3   No current facility-administered medications on file prior to visit.   Past Medical History:  Diagnosis Date  . Acquired absence of left great toe ([place])   . Acquired absence of other left toe(s) ([place])   . ACS (acute coronary syndrome) ([place])   . Anemia   . Anoxic brain injury ([place])   . Anxiety   . CAD (coronary artery disease)    DES mLAD 04/07/15; DES dRCA & DES pPDA 08/30/16; DES mCX & PTCA OM1 09/29/20  . Chronic diastolic (congestive) heart failure ([place])   . Chronic pain   . Chronic systolic (congestive) heart failure ([place])   . CKD (chronic kidney disease)   . Contrast dye induced nephropathy, possible 09/03/2020  . COPD (chronic obstructive pulmonary disease) ([place])   . Diabetes ([place])   . Diastolic CHF ([place])   . Dyspnea   . Family history of breast cancer 10/23/2021  . Gastro-esophageal reflux disease without esophagitis   . Hyperlipidemia   . Hypertension   . Hypertensive heart disease with heart failure ([place])   . Hypoxemia   . Idiopathic gout, multiple sites   . Leukocytosis 03/29/2022  . Localized edema   . Low back pain   . Mitral regurgitation   . Mitral regurgitation   . Mixed hyperlipidemia   . Mixed incontinence   . Morbid (severe) obesity due to excess calories ([place])   . Neuromuscular disorder ([place])   . Neuropathy   . Non-ST elevation (NSTEMI) myocardial infarction ([place])    08/29/20, 09/29/20  . OSA (obstructive sleep apnea) 09/03/2020  . Other specified hypothyroidism   . PAF (paroxysmal atrial fibrillation) (HCC)    s/p pulmonary vein isolation by  cryoablation 10/04/15 Dr. Minna Merritts in [facility]  . PEA (Pulseless electrical activity) ([place])    ~ 01/13/21 at [facility] during admission DKA, volume overload, possible CAP, hypoxia s/p ACLS with ROSC ~ 10-15  . Pneumonia   . Primary insomnia   . Rheumatoid arthritis ([place])   . Stroke ([place])   . Thyroid disease   . Type 2 diabetes mellitus with diabetic nephropathy ([place])   . Vitamin D deficiency    Past Surgical History:  Procedure Laterality Date  . ABDOMINAL HYSTERECTOMY     Partial- Still has both ovaries  . CARDIOVASCULAR STRESS TEST  08/13/2014   Nuclear; Normal  . CARPAL TUNNEL RELEASE    . CATARACT EXTRACTION, BILATERAL    . CESAREAN SECTION    . CHOLECYSTECTOMY    . CORONARY ANGIOPLASTY WITH STENT PLACEMENT     3 blockages/ 1 stent  . CORONARY STENT INTERVENTION N/A 09/29/2020   Procedure: CORONARY STENT INTERVENTION;  Surgeon: Sherren Mocha, MD;  Location: [facility];  Service: Cardiovascular;  Laterality: N/A;  CFX  . EYE SURGERY    . LEFT HEART CATH AND CORONARY ANGIOGRAPHY N/A 09/29/2020   Procedure: LEFT HEART CATH AND CORONARY ANGIOGRAPHY;  Surgeon: Sherren Mocha, MD;  Location: [facility];  Service: Cardiovascular;  Laterality: N/A;  . MITRAL VALVE REPAIR N/A 05/18/2021   Procedure: MITRAL VALVE REPAIR;  Surgeon: Early Osmond, MD;  Location: [facility];  Service: Cardiovascular;  Laterality: N/A;  . RIGHT HEART CATH N/A 04/27/2021   Procedure: RIGHT HEART CATH;  Surgeon: Jolaine Artist, MD;  Location: [facility];  Service: Cardiovascular;  Laterality: N/A;  . RIGHT/LEFT HEART CATH AND CORONARY ANGIOGRAPHY N/A 01/07/2017  Procedure: Right/Left Heart Cath and Coronary Angiography;  Surgeon: Larey Dresser, MD;  Location: [facility];  Service: Cardiovascular;  Laterality: N/A;  . ROTATOR CUFF REPAIR    . TEE WITHOUT CARDIOVERSION N/A 04/28/2021   Procedure: TRANSESOPHAGEAL ECHOCARDIOGRAM (TEE);  Surgeon: Jolaine Artist, MD;  Location: [facility];  Service: Cardiovascular;  Laterality: N/A;  . TEE WITHOUT CARDIOVERSION N/A 05/18/2021   Procedure: TRANSESOPHAGEAL ECHOCARDIOGRAM (TEE);  Surgeon: Early Osmond, MD;  Location: [facility];  Service: Cardiovascular;  Laterality: N/A;  . TOE AMPUTATION Left   . US ECHOCARDIOGRAPHY  05/2017   Normal    Family History  Problem Relation Age of Onset  . Breast cancer Mother 76       contralateral breast ca dx 53  . Coronary artery disease Mother   . Hypertension Mother   . Hyperlipidemia Mother   . Diabetes type II Mother   . Lung cancer Mother 72  . Diabetes Father   . Hypertension Father   . Mental illness Sister   . Bipolar disorder Other   . Depression Other   . Schizophrenia Other   . Cancer Other        MGF's sisters x2; unknown type   Social History   Socioeconomic History  . Marital status: Married    Spouse name: Octavia Bruckner  . Number of children: 2  . Years of education: 19  . Highest education level: Master's degree (e.g., MA, MS, MEng, MEd, MSW, MBA)  Occupational History  . Occupation: on disability/retired early former Education officer, museum  Tobacco Use  . Smoking status: Former    Types: Cigarettes    Start date: 29    Quit date: 1984    Years since quitting: 40.1    Passive exposure: Past  . Smokeless tobacco: Never  Vaping Use  . Vaping Use: Never used  Substance and Sexual Activity  . Alcohol use: Never  . Drug use: Never  . Sexual activity: Not on file  Other Topics Concern  . Not on file  Social History Narrative  . Not on file   Social Determinants of Health   Financial Resource Strain: Medium Risk (08/21/2022)   Overall Financial Resource Strain (CARDIA)   . Difficulty of Paying Living Expenses: Somewhat hard  Food Insecurity: Food Insecurity Present (07/26/2022)   Hunger Vital Sign   . Worried About Charity fundraiser in the Last Year: Sometimes true   . Ran Out of Food in the Last Year: Sometimes true   Transportation Needs: No Transportation Needs (07/25/2022)   PRAPARE - Transportation   . Lack of Transportation (Medical): No   . Lack of Transportation (Non-Medical): No  Physical Activity: Inactive (08/21/2022)   Exercise Vital Sign   . Days of Exercise per Week: 0 days   . Minutes of Exercise per Session: 0 min  Stress: Stress Concern Present (08/01/2022)   [place]   . Feeling of Stress : To some extent  Social Connections: Moderately Integrated (07/25/2022)   Social Connection and Isolation Panel [NHANES]   . Frequency of Communication with Friends and Family: More than three times a week   . Frequency of Social Gatherings with Friends and Family: More than three times a week   . Attends Religious Services: More than 4 times per year   . Active Member of Clubs or Organizations: No   . Attends Archivist Meetings: Never   .  Marital Status: Married    Objective:  BP (!) 94/48   Pulse 84   Temp (!) 97.1 F (36.2 C)   Resp 16   Ht 5' 4.5" (1.638 m)   Wt 232 lb (105.2 kg)   BMI 39.21 kg/m      08/31/2022    7:39 AM 08/30/2022   11:14 AM 08/27/2022   11:00 AM  BP/Weight  Systolic BP 94 [id number] [id number]  Diastolic BP 48 60 54  Wt. (Lbs) 232 233.4 229.75  BMI 39.21 kg/m2 39.44 kg/m2 38.83 kg/m2    Physical Exam  Diabetic Foot Exam - Simple   No data filed      Lab Results  Component Value Date   WBC 12.4 (H) 08/07/2022   HGB 9.9 (L) 08/07/2022   HCT 31.0 (L) 08/07/2022   PLT 247 08/07/2022   GLUCOSE 123 (H) 08/30/2022   CHOL 87 (L) 04/25/2022   TRIG 91 04/25/2022   HDL 54 04/25/2022   [place] 15 04/25/2022   ALT 16 07/19/2022   AST 18 07/19/2022   NA 138 08/30/2022   K 3.7 08/30/2022   CL 101 08/30/2022   CREATININE 1.88 (H) 08/30/2022   BUN 70 (H) 08/30/2022   CO2 28 08/30/2022   TSH 2.900 01/19/2022   INR 1.1 05/16/2021   HGBA1C 8.2 (H) 01/19/2022      Assessment & Plan:     Coronary artery disease of native artery of native heart with stable angina pectoris ([place])  Hypertensive heart and renal disease with congestive heart failure (HCC)  Chronic respiratory failure with hypoxia (HCC)  OSA on CPAP  GERD without esophagitis  Diabetic polyneuropathy associated with type 2 diabetes mellitus (HCC)  Hyperlipidemia LDL goal <70  Chronic idiopathic constipation     No orders of the defined types were placed in this encounter.   No orders of the defined types were placed in this encounter.    Follow-up: No follow-ups on file.   I,Marla I Leal-Borjas,acting as a scribe for [name], MD.,have documented all relevant documentation on the behalf of [name], MD,as directed by  [name], MD while in the presence of [name], MD.   An After Visit Summary was printed and given to the patient.  [name], MD [facility] [phone]

## 2022-08-31 ENCOUNTER — Telehealth (HOSPITAL_COMMUNITY): Payer: Self-pay

## 2022-08-31 ENCOUNTER — Ambulatory Visit (INDEPENDENT_AMBULATORY_CARE_PROVIDER_SITE_OTHER): Payer: PPO | Admitting: Family Medicine

## 2022-08-31 VITALS — BP 94/48 | HR 84 | Temp 97.1°F | Resp 16 | Ht 64.5 in | Wt 232.0 lb

## 2022-08-31 DIAGNOSIS — Z6839 Body mass index (BMI) 39.0-39.9, adult: Secondary | ICD-10-CM

## 2022-08-31 DIAGNOSIS — F330 Major depressive disorder, recurrent, mild: Secondary | ICD-10-CM

## 2022-08-31 DIAGNOSIS — N1832 Chronic kidney disease, stage 3b: Secondary | ICD-10-CM

## 2022-08-31 DIAGNOSIS — J9611 Chronic respiratory failure with hypoxia: Secondary | ICD-10-CM | POA: Diagnosis not present

## 2022-08-31 DIAGNOSIS — G4733 Obstructive sleep apnea (adult) (pediatric): Secondary | ICD-10-CM | POA: Diagnosis not present

## 2022-08-31 DIAGNOSIS — I130 Hypertensive heart and chronic kidney disease with heart failure and stage 1 through stage 4 chronic kidney disease, or unspecified chronic kidney disease: Secondary | ICD-10-CM | POA: Diagnosis not present

## 2022-08-31 DIAGNOSIS — G894 Chronic pain syndrome: Secondary | ICD-10-CM

## 2022-08-31 DIAGNOSIS — D631 Anemia in chronic kidney disease: Secondary | ICD-10-CM

## 2022-08-31 DIAGNOSIS — K219 Gastro-esophageal reflux disease without esophagitis: Secondary | ICD-10-CM

## 2022-08-31 DIAGNOSIS — I25118 Atherosclerotic heart disease of native coronary artery with other forms of angina pectoris: Secondary | ICD-10-CM

## 2022-08-31 DIAGNOSIS — E785 Hyperlipidemia, unspecified: Secondary | ICD-10-CM | POA: Diagnosis not present

## 2022-08-31 DIAGNOSIS — K5904 Chronic idiopathic constipation: Secondary | ICD-10-CM | POA: Diagnosis not present

## 2022-08-31 DIAGNOSIS — E1142 Type 2 diabetes mellitus with diabetic polyneuropathy: Secondary | ICD-10-CM

## 2022-08-31 MED ORDER — LINACLOTIDE 145 MCG PO CAPS: 145.0000 ug | ORAL_CAPSULE | Freq: Every day | ORAL | 1 refills | Status: DC

## 2022-08-31 NOTE — Telephone Encounter (Signed)
Order faxed to [facility] on 08/31/22

## 2022-08-31 NOTE — Patient Instructions (Signed)
Recommend try [facility] for [facility].

## 2022-09-01 DIAGNOSIS — K5904 Chronic idiopathic constipation: Secondary | ICD-10-CM | POA: Insufficient documentation

## 2022-09-01 LAB — CBC WITH DIFFERENTIAL/PLATELET
Basophils Absolute: 0.1 10*3/uL (ref 0.0–0.2)
Basos: 1 %
EOS (ABSOLUTE): 0.4 10*3/uL (ref 0.0–0.4)
Eos: 3 %
Hematocrit: 31.2 % — ABNORMAL LOW (ref 34.0–46.6)
Hemoglobin: 10 g/dL — ABNORMAL LOW (ref 11.1–15.9)
Immature Grans (Abs): 0 10*3/uL (ref 0.0–0.1)
Immature Granulocytes: 0 %
Lymphocytes Absolute: 2.8 10*3/uL (ref 0.7–3.1)
Lymphs: 26 %
MCH: 27.2 pg (ref 26.6–33.0)
MCHC: 32.1 g/dL (ref 31.5–35.7)
MCV: 85 fL (ref 79–97)
Monocytes Absolute: 0.7 10*3/uL (ref 0.1–0.9)
Monocytes: 6 %
Neutrophils Absolute: 7 10*3/uL (ref 1.4–7.0)
Neutrophils: 64 %
Platelets: 269 10*3/uL (ref 150–450)
RBC: 3.68 x10E6/uL — ABNORMAL LOW (ref 3.77–5.28)
RDW: 14.6 % (ref 11.7–15.4)
WBC: 10.9 10*3/uL — ABNORMAL HIGH (ref 3.4–10.8)

## 2022-09-01 LAB — LIPID PANEL
Chol/HDL Ratio: 2.8 ratio (ref 0.0–4.4)
Cholesterol, Total: 134 mg/dL (ref 100–199)
HDL: 48 mg/dL (ref >39)
LDL Chol Calc (NIH): 66 mg/dL (ref 0–99)
Triglycerides: 109 mg/dL (ref 0–149)
VLDL Cholesterol Cal: 20 mg/dL (ref 5–40)

## 2022-09-01 LAB — CARDIOVASCULAR RISK ASSESSMENT

## 2022-09-01 LAB — HEMOGLOBIN A1C
Est. average glucose Bld gHb Est-mCnc: 166 mg/dL
Hgb A1c MFr Bld: 7.4 % — ABNORMAL HIGH (ref 4.8–5.6)

## 2022-09-01 NOTE — Assessment & Plan Note (Signed)
Well controlled.  No changes to medicines. Repatha 140 mg weekly, Zetia 10 mg daily, fish Oil 1000 mg daily. Continue to work on eating a healthy diet and exercise.  Labs drawn today.

## 2022-09-01 NOTE — Assessment & Plan Note (Signed)
Continue plavix, aspirin, repatha,zetia.  Recommend continue to work on eating healthy diet and exercise.

## 2022-09-01 NOTE — Assessment & Plan Note (Signed)
Continue with Linzess.

## 2022-09-01 NOTE — Assessment & Plan Note (Signed)
Continue cpap.  

## 2022-09-01 NOTE — Assessment & Plan Note (Signed)
Control: good Recommend check sugars fasting daily. Recommend check feet daily. Recommend annual eye exams. Medicines: Levermir 50 units daily, Novolog Ssi before meals.  Continue to work on eating a healthy diet and exercise.

## 2022-09-01 NOTE — Assessment & Plan Note (Signed)
Well controlled.  No changes to medicines. Carvedilol 12.5 mg three times daily.hydralazine 25 mg three times a day, imdur 30 mg daily, torsemide 20 mg 4 daily. Continue to work on eating a healthy diet and exercise.  Labs drawn today.

## 2022-09-01 NOTE — Assessment & Plan Note (Signed)
The current medical regimen is effective;  continue present plan and medications. Continue protonix 40 mg daily.

## 2022-09-02 ENCOUNTER — Encounter: Payer: Self-pay | Admitting: Family Medicine

## 2022-09-02 NOTE — Assessment & Plan Note (Signed)
Continue oxygen 2 L at night.

## 2022-09-02 NOTE — Assessment & Plan Note (Signed)
Stable. 

## 2022-09-02 NOTE — Assessment & Plan Note (Signed)
The current medical regimen is effective;  continue present plan and medications. Continue buspar 5 mg three times a day and lorazepam 0.5 mg once daily,

## 2022-09-02 NOTE — Assessment & Plan Note (Signed)
Recommend continue to work on eating healthy diet and exercise. Comorbidities: diabetes, CORONARY ARTERY DISEASE.

## 2022-09-02 NOTE — Assessment & Plan Note (Signed)
Continue hydrocodone/apap. Due to chronic back pain.

## 2022-09-03 NOTE — Progress Notes (Signed)
Blood count abnormal but improved.  Liver function normal.  Cholesterol: Good.  Continue current medications. HBA1C: Improved from 8.2 down to 7.4.  Will forward to endocrinology.

## 2022-09-04 ENCOUNTER — Ambulatory Visit: Payer: PPO | Admitting: Rheumatology

## 2022-09-05 NOTE — Telephone Encounter (Signed)
Patient called to report she received shipment of Furoscix Patient has 4 doses -from [facility]  Will give her Furoscix to use PRN to help deal with fluid overload and try to minimize metolazone use and related episodes of AKI  -unsure if she needs at this time  Cardiomems goal 10 value today 12   Will notify provider of shipment And pt aware to only use as instructed

## 2022-09-06 ENCOUNTER — Ambulatory Visit: Payer: PPO

## 2022-09-07 ENCOUNTER — Encounter (HOSPITAL_COMMUNITY): Payer: Self-pay | Admitting: Emergency Medicine

## 2022-09-07 ENCOUNTER — Other Ambulatory Visit: Payer: Self-pay

## 2022-09-07 ENCOUNTER — Inpatient Hospital Stay (HOSPITAL_COMMUNITY)
Admission: EM | Admit: 2022-09-07 | Discharge: 2022-09-08 | DRG: 322 | Disposition: A | Discharge status: Home / Self Care | Payer: PPO | Attending: Cardiology | Admitting: Cardiology

## 2022-09-07 ENCOUNTER — Emergency Department (HOSPITAL_COMMUNITY): Payer: PPO

## 2022-09-07 ENCOUNTER — Encounter (HOSPITAL_COMMUNITY)
Admission: EM | Disposition: A | Discharge status: Home / Self Care | Payer: Self-pay | Source: Home / Self Care | Attending: Cardiology

## 2022-09-07 DIAGNOSIS — M069 Rheumatoid arthritis, unspecified: Secondary | ICD-10-CM | POA: Diagnosis present

## 2022-09-07 DIAGNOSIS — J811 Chronic pulmonary edema: Secondary | ICD-10-CM | POA: Diagnosis not present

## 2022-09-07 DIAGNOSIS — E1022 Type 1 diabetes mellitus with diabetic chronic kidney disease: Secondary | ICD-10-CM | POA: Diagnosis present

## 2022-09-07 DIAGNOSIS — Z954 Presence of other heart-valve replacement: Secondary | ICD-10-CM | POA: Diagnosis not present

## 2022-09-07 DIAGNOSIS — Z9049 Acquired absence of other specified parts of digestive tract: Secondary | ICD-10-CM

## 2022-09-07 DIAGNOSIS — R0789 Other chest pain: Secondary | ICD-10-CM | POA: Diagnosis not present

## 2022-09-07 DIAGNOSIS — Z888 Allergy status to other drugs, medicaments and biological substances status: Secondary | ICD-10-CM

## 2022-09-07 DIAGNOSIS — G931 Anoxic brain damage, not elsewhere classified: Secondary | ICD-10-CM | POA: Diagnosis present

## 2022-09-07 DIAGNOSIS — R079 Chest pain, unspecified: Secondary | ICD-10-CM

## 2022-09-07 DIAGNOSIS — I214 Non-ST elevation (NSTEMI) myocardial infarction: Secondary | ICD-10-CM | POA: Diagnosis not present

## 2022-09-07 DIAGNOSIS — I480 Paroxysmal atrial fibrillation: Secondary | ICD-10-CM | POA: Diagnosis present

## 2022-09-07 DIAGNOSIS — Z79899 Other long term (current) drug therapy: Secondary | ICD-10-CM | POA: Diagnosis not present

## 2022-09-07 DIAGNOSIS — Z6839 Body mass index (BMI) 39.0-39.9, adult: Secondary | ICD-10-CM

## 2022-09-07 DIAGNOSIS — Z885 Allergy status to narcotic agent status: Secondary | ICD-10-CM

## 2022-09-07 DIAGNOSIS — Z833 Family history of diabetes mellitus: Secondary | ICD-10-CM

## 2022-09-07 DIAGNOSIS — Z818 Family history of other mental and behavioral disorders: Secondary | ICD-10-CM

## 2022-09-07 DIAGNOSIS — I5033 Acute on chronic diastolic (congestive) heart failure: Secondary | ICD-10-CM | POA: Diagnosis not present

## 2022-09-07 DIAGNOSIS — D631 Anemia in chronic kidney disease: Secondary | ICD-10-CM | POA: Diagnosis present

## 2022-09-07 DIAGNOSIS — Z91018 Allergy to other foods: Secondary | ICD-10-CM

## 2022-09-07 DIAGNOSIS — G4733 Obstructive sleep apnea (adult) (pediatric): Secondary | ICD-10-CM | POA: Diagnosis present

## 2022-09-07 DIAGNOSIS — Z8249 Family history of ischemic heart disease and other diseases of the circulatory system: Secondary | ICD-10-CM

## 2022-09-07 DIAGNOSIS — M1009 Idiopathic gout, multiple sites: Secondary | ICD-10-CM | POA: Diagnosis present

## 2022-09-07 DIAGNOSIS — J449 Chronic obstructive pulmonary disease, unspecified: Secondary | ICD-10-CM | POA: Diagnosis present

## 2022-09-07 DIAGNOSIS — Z9889 Other specified postprocedural states: Secondary | ICD-10-CM | POA: Diagnosis not present

## 2022-09-07 DIAGNOSIS — Z8673 Personal history of transient ischemic attack (TIA), and cerebral infarction without residual deficits: Secondary | ICD-10-CM

## 2022-09-07 DIAGNOSIS — Z8679 Personal history of other diseases of the circulatory system: Secondary | ICD-10-CM | POA: Diagnosis not present

## 2022-09-07 DIAGNOSIS — I200 Unstable angina: Secondary | ICD-10-CM | POA: Diagnosis not present

## 2022-09-07 DIAGNOSIS — E785 Hyperlipidemia, unspecified: Secondary | ICD-10-CM | POA: Diagnosis present

## 2022-09-07 DIAGNOSIS — Z886 Allergy status to analgesic agent status: Secondary | ICD-10-CM

## 2022-09-07 DIAGNOSIS — N184 Chronic kidney disease, stage 4 (severe): Secondary | ICD-10-CM | POA: Diagnosis not present

## 2022-09-07 DIAGNOSIS — I5042 Chronic combined systolic (congestive) and diastolic (congestive) heart failure: Secondary | ICD-10-CM | POA: Diagnosis present

## 2022-09-07 DIAGNOSIS — E1051 Type 1 diabetes mellitus with diabetic peripheral angiopathy without gangrene: Secondary | ICD-10-CM | POA: Diagnosis present

## 2022-09-07 DIAGNOSIS — E1142 Type 2 diabetes mellitus with diabetic polyneuropathy: Secondary | ICD-10-CM | POA: Diagnosis present

## 2022-09-07 DIAGNOSIS — Z9981 Dependence on supplemental oxygen: Secondary | ICD-10-CM

## 2022-09-07 DIAGNOSIS — N170 Acute kidney failure with tubular necrosis: Secondary | ICD-10-CM | POA: Diagnosis not present

## 2022-09-07 DIAGNOSIS — E1042 Type 1 diabetes mellitus with diabetic polyneuropathy: Secondary | ICD-10-CM | POA: Diagnosis present

## 2022-09-07 DIAGNOSIS — Z89422 Acquired absence of other left toe(s): Secondary | ICD-10-CM

## 2022-09-07 DIAGNOSIS — Z7902 Long term (current) use of antithrombotics/antiplatelets: Secondary | ICD-10-CM | POA: Diagnosis not present

## 2022-09-07 DIAGNOSIS — I25110 Atherosclerotic heart disease of native coronary artery with unstable angina pectoris: Secondary | ICD-10-CM | POA: Diagnosis present

## 2022-09-07 DIAGNOSIS — Z803 Family history of malignant neoplasm of breast: Secondary | ICD-10-CM

## 2022-09-07 DIAGNOSIS — I340 Nonrheumatic mitral (valve) insufficiency: Secondary | ICD-10-CM | POA: Diagnosis present

## 2022-09-07 DIAGNOSIS — Z955 Presence of coronary angioplasty implant and graft: Secondary | ICD-10-CM | POA: Diagnosis not present

## 2022-09-07 DIAGNOSIS — N1832 Chronic kidney disease, stage 3b: Secondary | ICD-10-CM | POA: Diagnosis present

## 2022-09-07 DIAGNOSIS — K219 Gastro-esophageal reflux disease without esophagitis: Secondary | ICD-10-CM | POA: Diagnosis present

## 2022-09-07 DIAGNOSIS — I130 Hypertensive heart and chronic kidney disease with heart failure and stage 1 through stage 4 chronic kidney disease, or unspecified chronic kidney disease: Secondary | ICD-10-CM | POA: Diagnosis present

## 2022-09-07 DIAGNOSIS — Z90710 Acquired absence of both cervix and uterus: Secondary | ICD-10-CM

## 2022-09-07 DIAGNOSIS — Z95818 Presence of other cardiac implants and grafts: Secondary | ICD-10-CM | POA: Diagnosis not present

## 2022-09-07 DIAGNOSIS — Z91013 Allergy to seafood: Secondary | ICD-10-CM

## 2022-09-07 DIAGNOSIS — Z89412 Acquired absence of left great toe: Secondary | ICD-10-CM

## 2022-09-07 DIAGNOSIS — E782 Mixed hyperlipidemia: Secondary | ICD-10-CM | POA: Diagnosis present

## 2022-09-07 DIAGNOSIS — I252 Old myocardial infarction: Secondary | ICD-10-CM

## 2022-09-07 DIAGNOSIS — Z8674 Personal history of sudden cardiac arrest: Secondary | ICD-10-CM

## 2022-09-07 DIAGNOSIS — I25118 Atherosclerotic heart disease of native coronary artery with other forms of angina pectoris: Secondary | ICD-10-CM | POA: Diagnosis present

## 2022-09-07 DIAGNOSIS — I2510 Atherosclerotic heart disease of native coronary artery without angina pectoris: Secondary | ICD-10-CM | POA: Diagnosis not present

## 2022-09-07 DIAGNOSIS — Z801 Family history of malignant neoplasm of trachea, bronchus and lung: Secondary | ICD-10-CM

## 2022-09-07 HISTORY — PX: CORONARY STENT INTERVENTION: CATH118234

## 2022-09-07 HISTORY — PX: CORONARY PRESSURE/FFR STUDY: CATH118243

## 2022-09-07 HISTORY — PX: LEFT HEART CATH AND CORONARY ANGIOGRAPHY: CATH118249

## 2022-09-07 LAB — CBC
HCT: 33.4 % — ABNORMAL LOW (ref 36.0–46.0)
HCT: 32 % — ABNORMAL LOW (ref 36.0–46.0)
Hemoglobin: 10.1 g/dL — ABNORMAL LOW (ref 12.0–15.0)
Hemoglobin: 10.1 g/dL — ABNORMAL LOW (ref 12.0–15.0)
MCH: 26.6 pg (ref 26.0–34.0)
MCH: 26.9 pg (ref 26.0–34.0)
MCHC: 30.2 g/dL (ref 30.0–36.0)
MCHC: 31.6 g/dL (ref 30.0–36.0)
MCV: 88.1 fL (ref 80.0–100.0)
MCV: 85.1 fL (ref 80.0–100.0)
Platelets: 248 10*3/uL (ref 150–400)
Platelets: 228 10*3/uL (ref 150–400)
RBC: 3.79 MIL/uL — ABNORMAL LOW (ref 3.87–5.11)
RBC: 3.76 MIL/uL — ABNORMAL LOW (ref 3.87–5.11)
RDW: 16 % — ABNORMAL HIGH (ref 11.5–15.5)
RDW: 15.8 % — ABNORMAL HIGH (ref 11.5–15.5)
WBC: 11.2 10*3/uL — ABNORMAL HIGH (ref 4.0–10.5)
WBC: 11.8 10*3/uL — ABNORMAL HIGH (ref 4.0–10.5)
nRBC: 0 % (ref 0.0–0.2)
nRBC: 0 % (ref 0.0–0.2)

## 2022-09-07 LAB — BASIC METABOLIC PANEL
Anion gap: 11 (ref 5–15)
BUN: 64 mg/dL — ABNORMAL HIGH (ref 6–20)
CO2: 24 mmol/L (ref 22–32)
Calcium: 9.1 mg/dL (ref 8.9–10.3)
Chloride: 101 mmol/L (ref 98–111)
Creatinine, Ser: 1.78 mg/dL — ABNORMAL HIGH (ref 0.44–1.00)
GFR, Estimated: 32 mL/min — ABNORMAL LOW (ref >60)
Glucose, Bld: 169 mg/dL — ABNORMAL HIGH (ref 70–99)
Potassium: 3.9 mmol/L (ref 3.5–5.1)
Sodium: 136 mmol/L (ref 135–145)

## 2022-09-07 LAB — TROPONIN I (HIGH SENSITIVITY)
Troponin I (High Sensitivity): 39 ng/L — ABNORMAL HIGH (ref <18)
Troponin I (High Sensitivity): 40 ng/L — ABNORMAL HIGH (ref <18)

## 2022-09-07 LAB — HEPATIC FUNCTION PANEL
ALT: 32 U/L (ref 0–44)
AST: 33 U/L (ref 15–41)
Albumin: 3.3 g/dL — ABNORMAL LOW (ref 3.5–5.0)
Alkaline Phosphatase: 159 U/L — ABNORMAL HIGH (ref 38–126)
Bilirubin, Direct: <0.1 mg/dL (ref 0.0–0.2)
Total Bilirubin: 0.7 mg/dL (ref 0.3–1.2)
Total Protein: 7.4 g/dL (ref 6.5–8.1)

## 2022-09-07 LAB — HIV ANTIBODY (ROUTINE TESTING W REFLEX): HIV Screen 4th Generation wRfx: NONREACTIVE

## 2022-09-07 LAB — POCT ACTIVATED CLOTTING TIME
Activated Clotting Time: 261 seconds
Activated Clotting Time: 314 seconds
Activated Clotting Time: 201 seconds
Activated Clotting Time: 174 seconds

## 2022-09-07 LAB — BRAIN NATRIURETIC PEPTIDE: B Natriuretic Peptide: 168.7 pg/mL — ABNORMAL HIGH (ref 0.0–100.0)

## 2022-09-07 LAB — GLUCOSE, CAPILLARY
Glucose-Capillary: 62 mg/dL — ABNORMAL LOW (ref 70–99)
Glucose-Capillary: 88 mg/dL (ref 70–99)
Glucose-Capillary: 122 mg/dL — ABNORMAL HIGH (ref 70–99)

## 2022-09-07 LAB — CREATININE, SERUM
Creatinine, Ser: 1.69 mg/dL — ABNORMAL HIGH (ref 0.44–1.00)
GFR, Estimated: 35 mL/min — ABNORMAL LOW (ref >60)

## 2022-09-07 SURGERY — LEFT HEART CATH AND CORONARY ANGIOGRAPHY
Anesthesia: LOCAL

## 2022-09-07 MED ORDER — MIDAZOLAM HCL 2 MG/2ML IJ SOLN
INTRAMUSCULAR | Status: DC | PRN
  Administered 2022-09-07 (×2): 1 mg via INTRAVENOUS

## 2022-09-07 MED ORDER — ACETAMINOPHEN 325 MG PO TABS: 650.0000 mg | ORAL_TABLET | ORAL | Status: DC | PRN

## 2022-09-07 MED ORDER — ISOSORBIDE MONONITRATE ER 30 MG PO TB24
30.0000 mg | ORAL_TABLET | Freq: Every day | ORAL | Status: DC
  Administered 2022-09-08: 30 mg via ORAL
  Filled 2022-09-07: qty 1

## 2022-09-07 MED ORDER — NITROGLYCERIN 0.4 MG SL SUBL: 0.4000 mg | SUBLINGUAL_TABLET | SUBLINGUAL | Status: DC | PRN

## 2022-09-07 MED ORDER — INSULIN ASPART 100 UNIT/ML IJ SOLN
0.0000 [IU] | Freq: Three times a day (TID) | INTRAMUSCULAR | Status: DC
  Administered 2022-09-08: 15 [IU] via SUBCUTANEOUS
  Administered 2022-09-08: 8 [IU] via SUBCUTANEOUS

## 2022-09-07 MED ORDER — LINACLOTIDE 145 MCG PO CAPS
145.0000 ug | ORAL_CAPSULE | Freq: Every day | ORAL | Status: DC
  Filled 2022-09-07 (×2): qty 1

## 2022-09-07 MED ORDER — TORSEMIDE 20 MG PO TABS
80.0000 mg | ORAL_TABLET | Freq: Every day | ORAL | Status: DC
  Administered 2022-09-08: 80 mg via ORAL
  Filled 2022-09-07: qty 4

## 2022-09-07 MED ORDER — ENOXAPARIN SODIUM 40 MG/0.4ML IJ SOSY
40.0000 mg | PREFILLED_SYRINGE | INTRAMUSCULAR | Status: DC
  Administered 2022-09-08: 40 mg via SUBCUTANEOUS
  Filled 2022-09-07: qty 0.4

## 2022-09-07 MED ORDER — CLOPIDOGREL BISULFATE 75 MG PO TABS
ORAL_TABLET | ORAL | Status: DC | PRN
  Administered 2022-09-07: 75 mg via ORAL

## 2022-09-07 MED ORDER — VERAPAMIL HCL 2.5 MG/ML IV SOLN
INTRAVENOUS | Status: AC
  Filled 2022-09-07: qty 2

## 2022-09-07 MED ORDER — SODIUM CHLORIDE 0.9 % WEIGHT BASED INFUSION
1.0000 mL/kg/h | INTRAVENOUS | Status: AC
  Administered 2022-09-07: 1 mL/kg/h via INTRAVENOUS

## 2022-09-07 MED ORDER — HYDRALAZINE HCL 25 MG PO TABS
25.0000 mg | ORAL_TABLET | Freq: Three times a day (TID) | ORAL | Status: DC
  Administered 2022-09-07: 25 mg via ORAL
  Filled 2022-09-07 (×2): qty 1

## 2022-09-07 MED ORDER — HEPARIN SODIUM (PORCINE) 1000 UNIT/ML IJ SOLN
INTRAMUSCULAR | Status: AC
  Filled 2022-09-07: qty 10

## 2022-09-07 MED ORDER — LORAZEPAM 0.5 MG PO TABS
0.5000 mg | ORAL_TABLET | Freq: Every day | ORAL | Status: DC
  Administered 2022-09-07: 0.5 mg via ORAL
  Filled 2022-09-07: qty 1

## 2022-09-07 MED ORDER — SODIUM CHLORIDE 0.9% FLUSH: 3.0000 mL | INTRAVENOUS | Status: DC | PRN

## 2022-09-07 MED ORDER — FENTANYL CITRATE (PF) 100 MCG/2ML IJ SOLN
INTRAMUSCULAR | Status: AC
  Filled 2022-09-07: qty 2

## 2022-09-07 MED ORDER — LIDOCAINE HCL (PF) 1 % IJ SOLN
INTRAMUSCULAR | Status: AC
  Filled 2022-09-07: qty 30

## 2022-09-07 MED ORDER — MIDAZOLAM HCL 2 MG/2ML IJ SOLN
INTRAMUSCULAR | Status: AC
  Filled 2022-09-07: qty 2

## 2022-09-07 MED ORDER — SODIUM CHLORIDE 0.9 % IV SOLN: 250.0000 mL | INTRAVENOUS | Status: DC | PRN

## 2022-09-07 MED ORDER — HYDRALAZINE HCL 20 MG/ML IJ SOLN: 10.0000 mg | INTRAMUSCULAR | Status: AC | PRN

## 2022-09-07 MED ORDER — ALLOPURINOL 300 MG PO TABS
300.0000 mg | ORAL_TABLET | Freq: Every day | ORAL | Status: DC
  Administered 2022-09-08: 300 mg via ORAL
  Filled 2022-09-07: qty 1

## 2022-09-07 MED ORDER — BUSPIRONE HCL 5 MG PO TABS
5.0000 mg | ORAL_TABLET | Freq: Three times a day (TID) | ORAL | Status: DC
  Administered 2022-09-07 – 2022-09-08 (×2): 5 mg via ORAL
  Filled 2022-09-07 (×2): qty 1

## 2022-09-07 MED ORDER — EZETIMIBE 10 MG PO TABS
10.0000 mg | ORAL_TABLET | Freq: Every day | ORAL | Status: DC
  Administered 2022-09-08: 10 mg via ORAL
  Filled 2022-09-07 (×2): qty 1

## 2022-09-07 MED ORDER — HEPARIN (PORCINE) IN NACL 1000-0.9 UT/500ML-% IV SOLN
INTRAVENOUS | Status: DC | PRN
  Administered 2022-09-07 (×3): 500 mL

## 2022-09-07 MED ORDER — FENTANYL CITRATE (PF) 100 MCG/2ML IJ SOLN
INTRAMUSCULAR | Status: DC | PRN
  Administered 2022-09-07 (×2): 25 ug via INTRAVENOUS

## 2022-09-07 MED ORDER — MAGNESIUM OXIDE -MG SUPPLEMENT 400 (240 MG) MG PO TABS
800.0000 mg | ORAL_TABLET | Freq: Every day | ORAL | Status: DC
  Administered 2022-09-08: 800 mg via ORAL
  Filled 2022-09-07 (×3): qty 2

## 2022-09-07 MED ORDER — PANTOPRAZOLE SODIUM 40 MG PO TBEC
40.0000 mg | DELAYED_RELEASE_TABLET | Freq: Every day | ORAL | Status: DC
  Administered 2022-09-08: 40 mg via ORAL
  Filled 2022-09-07: qty 1

## 2022-09-07 MED ORDER — LIDOCAINE HCL (PF) 1 % IJ SOLN
INTRAMUSCULAR | Status: DC | PRN
  Administered 2022-09-07: 2 mL

## 2022-09-07 MED ORDER — IOHEXOL 350 MG/ML SOLN
INTRAVENOUS | Status: DC | PRN
  Administered 2022-09-07: 100 mL

## 2022-09-07 MED ORDER — ASPIRIN 325 MG PO TABS
325.0000 mg | ORAL_TABLET | Freq: Every day | ORAL | Status: DC
  Administered 2022-09-07: 325 mg via ORAL
  Filled 2022-09-07 (×2): qty 1

## 2022-09-07 MED ORDER — ONDANSETRON HCL 4 MG/2ML IJ SOLN: 4.0000 mg | Freq: Four times a day (QID) | INTRAMUSCULAR | Status: DC | PRN

## 2022-09-07 MED ORDER — POTASSIUM CHLORIDE CRYS ER 20 MEQ PO TBCR
20.0000 meq | EXTENDED_RELEASE_TABLET | Freq: Every day | ORAL | Status: DC
  Administered 2022-09-08: 20 meq via ORAL
  Filled 2022-09-07: qty 1

## 2022-09-07 MED ORDER — HYDROCODONE-ACETAMINOPHEN 7.5-325 MG PO TABS
1.0000 | ORAL_TABLET | Freq: Three times a day (TID) | ORAL | Status: DC | PRN
  Administered 2022-09-07 – 2022-09-08 (×2): 1 via ORAL
  Filled 2022-09-07 (×2): qty 1

## 2022-09-07 MED ORDER — NITROGLYCERIN 2 % TD OINT
1.0000 [in_us] | TOPICAL_OINTMENT | Freq: Four times a day (QID) | TRANSDERMAL | Status: DC
  Administered 2022-09-07: 1 [in_us] via TOPICAL
  Filled 2022-09-07 (×2): qty 1

## 2022-09-07 MED ORDER — SODIUM CHLORIDE 0.9% FLUSH
3.0000 mL | Freq: Two times a day (BID) | INTRAVENOUS | Status: DC
  Administered 2022-09-08: 3 mL via INTRAVENOUS

## 2022-09-07 MED ORDER — INSULIN ASPART 100 UNIT/ML IJ SOLN: 0.0000 [IU] | Freq: Every day | INTRAMUSCULAR | Status: DC

## 2022-09-07 MED ORDER — ENOXAPARIN SODIUM 40 MG/0.4ML IJ SOSY: 40.0000 mg | PREFILLED_SYRINGE | INTRAMUSCULAR | Status: DC

## 2022-09-07 MED ORDER — CLOPIDOGREL BISULFATE 75 MG PO TABS
ORAL_TABLET | ORAL | Status: AC
  Filled 2022-09-07: qty 1

## 2022-09-07 MED ORDER — CARVEDILOL 12.5 MG PO TABS
12.5000 mg | ORAL_TABLET | Freq: Two times a day (BID) | ORAL | Status: DC
  Administered 2022-09-07 – 2022-09-08 (×2): 12.5 mg via ORAL
  Filled 2022-09-07 (×2): qty 1

## 2022-09-07 MED ORDER — SODIUM CHLORIDE 0.9 % IV SOLN: INTRAVENOUS | Status: DC

## 2022-09-07 MED ORDER — HEPARIN SODIUM (PORCINE) 1000 UNIT/ML IJ SOLN
INTRAMUSCULAR | Status: DC | PRN
  Administered 2022-09-07: 10000 [IU] via INTRAVENOUS

## 2022-09-07 MED ORDER — CLOPIDOGREL BISULFATE 75 MG PO TABS
75.0000 mg | ORAL_TABLET | Freq: Every day | ORAL | Status: DC
  Administered 2022-09-08: 75 mg via ORAL
  Filled 2022-09-07 (×2): qty 1

## 2022-09-07 SURGICAL SUPPLY — 24 items
BALL SAPPHIRE NC24 2.75X22 (BALLOONS) ×1
BALLN SCOREFLEX 2.50X10 (BALLOONS) ×1
BALLOON SAPPHIRE NC24 2.75X22 (BALLOONS) IMPLANT
BALLOON SCOREFLEX 2.50X10 (BALLOONS) IMPLANT
CATH INFINITI 5FR JL4 (CATHETERS) IMPLANT
CATH INFINITI 5FR MULTPACK ANG (CATHETERS) IMPLANT
CATH VISTA GUIDE 6FR XBLAD3.5 (CATHETERS) IMPLANT
GLIDESHEATH SLEND SS 6F .021 (SHEATH) IMPLANT
GUIDEWIRE INQWIRE 1.5J.035X260 (WIRE) IMPLANT
GUIDEWIRE PRESSURE X 175 (WIRE) IMPLANT
INQWIRE 1.5J .035X260CM (WIRE)
KIT ENCORE 26 ADVANTAGE (KITS) IMPLANT
KIT ESSENTIALS PG (KITS) IMPLANT
KIT HEART LEFT (KITS) ×1 IMPLANT
PACK CARDIAC CATHETERIZATION (CUSTOM PROCEDURE TRAY) ×1 IMPLANT
SHEATH PINNACLE 5F 10CM (SHEATH) IMPLANT
SHEATH PINNACLE 6F 10CM (SHEATH) IMPLANT
STENT SYNERGY XD 2.75X28 (Permanent Stent) IMPLANT
SYNERGY XD 2.75X28 (Permanent Stent) ×1 IMPLANT
TRANSDUCER W/STOPCOCK (MISCELLANEOUS) ×1 IMPLANT
TUBING CIL FLEX 10 FLL-RA (TUBING) ×1 IMPLANT
WIRE ASAHI PROWATER 180CM (WIRE) IMPLANT
WIRE EMERALD 3MM-J .035X150CM (WIRE) IMPLANT
WIRE MICRO SET SILHO 5FR 7 (SHEATH) IMPLANT

## 2022-09-07 NOTE — H&P (Addendum)
Cardiology Admission History and Physical   Patient ID: Dana [name] MRN: [id number]; DOB: 1960-10-31   Admission date: 09/07/2022  PCP:  [name], MD   [place] Providers Cardiologist:  [name], MD        Chief Complaint:  chest pain  Patient Profile:   Dana [name] is a 62 y.o. female with hx diastolic heart failure s/p cardiomems 02/2018, CAD s/p DES to RCA, LAD, Cx, DM I, hypertension, hyperlipidemia, PAD s/p left great toe and 4th toe amputation, OSA on CPAP, PAF s/p ablation, CKD stage IIIb who is being seen 09/07/2022 for the evaluation of chest pain.  History of Present Illness:   Dana [name] presented to the ED today with symptoms of chest pain that began this morning while waking up. This pain was stabbing/sharp in nature, located left side of chest and radiating into the shoulder. She also felt like her shoulder became heavy. Patient took 2 nitroglycerin but did not notice relief of pain. Pain was associated with nausea and dyspnea. She tried sitting up but did not find that this improved pain. Patient eventually took a third nitro and pain reduced from 10/10 to 7/10. She says that this pain is almost exactly the same as what she felt prior to previous MI. The only difference this time is that pain radiates to shoulder rather that the middle of her back. She otherwise feels like her HF has been well managed/stable. Her weight has remained flat and she has not needed to increase the number of pillows she sleeps on (two), nor needed to take diuretics. Lower extremity is stable.   Upon arrival to the ED, patient was given a nitroglycerin patch and pain improved to 2/10. She says that it has remained stable at 2/10, no worse no better.  Patient with complex cardiac history including MI with DES to RCA and LAD in 2018 as well as to mid circumflex in March of 2022. In the summer of 2022 patient was admitted to [facility] with DKA but subsequently  developed pulmonary fascular congestion. She suffered a PEA cardiac arrest on 01/12/21. Patient had ROSC after 15 min ACLS. She was moved to [facility] and noted to have anoxic brain injury on MRI. Patient re-admitted October 2022 with heart failure exacerbation. This admission was complicated by AKI on CKD. RHC with mild to moderately elevated filling pressures, suggestive of severe MR vs severe diastolic dysfunction. TTE with severe central MR and preserved LVEF. Patient had TEER of mitral valve in November 2022. Repeat echo March 2023 with LVEF 55-60%, moderate MR with gradient 7-40mHg.  TTE 07/18/22 with preserved LVEF, normal RV and moderate MR which was stable.   At last HF follow up, just over a week ago, Cardiomems stable with PAD 10-12.   Past Medical History:  Diagnosis Date   Acquired absence of left great toe ([place]    Acquired absence of other left toe(s) ([place]    ACS (acute coronary syndrome) ([place]    Anemia    Anoxic brain injury ([place]    Anxiety    CAD (coronary artery disease)    DES mLAD 04/07/15; DES dRCA & DES pPDA 08/30/16; DES mCX & PTCA OM1 09/29/20   Chronic diastolic (congestive) heart failure (HCC)    Chronic pain    Chronic systolic (congestive) heart failure (HCC)    CKD (chronic kidney disease)    Contrast dye induced nephropathy, possible 09/03/2020   COPD (chronic obstructive pulmonary disease) ([place]  Diabetes ([place])    Diastolic CHF ([place])    Dyspnea    Family history of breast cancer 10/23/2021   Gastro-esophageal reflux disease without esophagitis    Hyperlipidemia    Hypertension    Hypertensive heart disease with heart failure (HCC)    Hypoxemia    Idiopathic gout, multiple sites    Leukocytosis 03/29/2022   Localized edema    Low back pain    Mitral regurgitation    Mitral regurgitation    Mixed hyperlipidemia    Mixed incontinence    Morbid (severe) obesity due to excess calories (HCC)    Neuromuscular disorder (HCC)    Neuropathy    Non-ST elevation  (NSTEMI) myocardial infarction ([place])    08/29/20, 09/29/20   OSA (obstructive sleep apnea) 09/03/2020   Other specified hypothyroidism    PAF (paroxysmal atrial fibrillation) ([place])    s/p pulmonary vein isolation by cryoablation 10/04/15 Dr. Minna Merritts in [facility]   PEA (Pulseless electrical activity) [facility])    ~ 01/13/21 at [facility] during admission DKA, volume overload, possible CAP, hypoxia s/p ACLS with ROSC ~ 10-15   Pneumonia    Primary insomnia    Rheumatoid arthritis ([place])    Stroke ([place])    Thyroid disease    Type 2 diabetes mellitus with diabetic nephropathy ([place])    Vitamin D deficiency     Past Surgical History:  Procedure Laterality Date   ABDOMINAL HYSTERECTOMY     Partial- Still has both ovaries   CARDIOVASCULAR STRESS TEST  08/13/2014   Nuclear; Normal   CARPAL TUNNEL RELEASE     CATARACT EXTRACTION, BILATERAL     CESAREAN SECTION     CHOLECYSTECTOMY     CORONARY ANGIOPLASTY WITH STENT PLACEMENT     3 blockages/ 1 stent   CORONARY STENT INTERVENTION N/A 09/29/2020   Procedure: CORONARY STENT INTERVENTION;  Surgeon: Sherren Mocha, MD;  Location: [place] CV LAB;  Service: Cardiovascular;  Laterality: N/A;  CFX   EYE SURGERY     LEFT HEART CATH AND CORONARY ANGIOGRAPHY N/A 09/29/2020   Procedure: LEFT HEART CATH AND CORONARY ANGIOGRAPHY;  Surgeon: Sherren Mocha, MD;  Location: [place] CV LAB;  Service: Cardiovascular;  Laterality: N/A;   MITRAL VALVE REPAIR N/A 05/18/2021   Procedure: MITRAL VALVE REPAIR;  Surgeon: Early Osmond, MD;  Location: [place] CV LAB;  Service: Cardiovascular;  Laterality: N/A;   RIGHT HEART CATH N/A 04/27/2021   Procedure: RIGHT HEART CATH;  Surgeon: Jolaine Artist, MD;  Location: [place] CV LAB;  Service: Cardiovascular;  Laterality: N/A;   RIGHT/LEFT HEART CATH AND CORONARY ANGIOGRAPHY N/A 01/07/2017   Procedure: Right/Left Heart Cath and Coronary Angiography;  Surgeon: Larey Dresser, MD;  Location: [place]  CV LAB;  Service: Cardiovascular;  Laterality: N/A;   ROTATOR CUFF REPAIR     TEE WITHOUT CARDIOVERSION N/A 04/28/2021   Procedure: TRANSESOPHAGEAL ECHOCARDIOGRAM (TEE);  Surgeon: Jolaine Artist, MD;  Location: [facility] ENDOSCOPY;  Service: Cardiovascular;  Laterality: N/A;   TEE WITHOUT CARDIOVERSION N/A 05/18/2021   Procedure: TRANSESOPHAGEAL ECHOCARDIOGRAM (TEE);  Surgeon: Early Osmond, MD;  Location: [place] CV LAB;  Service: Cardiovascular;  Laterality: N/A;   TOE AMPUTATION Left    US ECHOCARDIOGRAPHY  05/2017   Normal     Medications Prior to Admission: Prior to Admission medications   Medication Sig Start Date End Date Taking? Authorizing Provider  allopurinol (ZYLOPRIM) 300 MG tablet Take 1 tablet (300 mg total) by  mouth daily. 07/05/22   Cox, Elnita Maxwell, MD  busPIRone (BUSPAR) 5 MG tablet Take 1 tablet (5 mg total) by mouth 3 (three) times daily. 04/10/22   Cox, Elnita Maxwell, MD  carvedilol (COREG) 12.5 MG tablet TAKE ONE TABLET BY MOUTH TWICE DAILY 01/09/22   Rafael Bihari, FNP  clopidogrel (PLAVIX) 75 MG tablet Take 1 tablet (75 mg total) by mouth daily. 07/05/22   CoxElnita Maxwell, MD  Continuous Blood Gluc Sensor (FREESTYLE LIBRE 2 SENSOR) MISC USE TO check blood glucose AS DIRECTED AND CHANGE sensor every 14 DAYS 01/11/22   [provider]  CRANBERRY PO Take 2 tablets by mouth daily. 2 gummies    [provider]  EPINEPHrine 0.3 mg/0.3 mL IJ SOAJ injection Use as directed for life-threatening allergic reaction. 06/24/18   Kennith Gain, MD  Evolocumab (REPATHA SURECLICK) [id number] MG/ML SOAJ Inject 1 Dose into the skin every 14 (fourteen) days. 01/17/22   Hilty, Nadean Corwin, MD  ezetimibe (ZETIA) 10 MG tablet Take 1 tablet (10 mg total) by mouth daily. 10/11/21   Early Osmond, MD  hydrALAZINE (APRESOLINE) 25 MG tablet Take 1 tablet (25 mg total) by mouth 3 (three) times daily. 02/02/22   [place], Maricela Bo, FNP  HYDROcodone-acetaminophen (NORCO) 7.5-325 MG  tablet TAKE ONE TABLET BY MOUTH THREE TIMES DAILY AS NEEDED 08/29/22   Cox, Elnita Maxwell, MD  insulin aspart (NOVOLOG FLEXPEN) 100 UNIT/ML FlexPen 2-10 U before meals based on SSI. 04/10/22   Cox, Kirsten, MD  Insulin Pen Needle (BD PEN NEEDLE NANO 2ND GEN) 32G X 4 MM MISC Inject 1 each into the skin in the morning, at noon, in the evening, and at bedtime. 08/30/21   CoxElnita Maxwell, MD  isosorbide mononitrate (IMDUR) 30 MG 24 hr tablet Take 1 tablet (30 mg total) by mouth daily. 07/05/22   Cox, Elnita Maxwell, MD  lactulose (CHRONULAC) 10 GM/15ML solution Take 15 mLs (10 g total) by mouth daily as needed for mild constipation. 08/30/21   Cox, Elnita Maxwell, MD  latanoprost (XALATAN) 0.005 % ophthalmic solution Place 1 drop into both eyes at bedtime.    [provider]  LEVEMIR FLEXPEN 100 UNIT/ML FlexPen Inject 50 units into THE SKIN daily 07/30/22   Cox, Elnita Maxwell, MD  linaclotide [facility]) 145 MCG CAPS capsule Take 1 capsule (145 mcg total) by mouth daily before breakfast. 08/31/22   Cox, Kirsten, MD  LORazepam (ATIVAN) 0.5 MG tablet TAKE ONE TABLET BY MOUTH ONCE daily AS NEEDED FOR ANXIETY 07/22/22   Cox, Kirsten, MD  magnesium oxide (MAG-OX) 400 MG tablet Take 2 tablets (800 mg total) by mouth daily. Take 2 (400 mg) daily=800 mg 04/09/18   Shirley Friar, PA-C  metolazone (ZAROXOLYN) 2.5 MG tablet Take 1 tablet (2.5 mg total) by mouth as needed. 08/09/22 11/07/22  Consuelo Pandy, PA-C  nitroGLYCERIN (NITROSTAT) 0.4 MG SL tablet Dissolve 1 tab under tongue as needed for chest pain. May repeat every 5 minutes x 2 doses. If no relief call 9-1-1. Please call [phone] to schedule an appointment for future refills. Thank you. 07/13/22   Eileen Stanford, PA-C  Omega-3 Fatty Acids (FISH OIL PO) Take 1 capsule by mouth daily.    [provider]  OXYGEN Inhale 2 L into the lungs at bedtime.    [provider]  pantoprazole (PROTONIX) 40 MG tablet Take 1 tablet (40 mg total) by mouth daily.  07/05/22   CoxElnita Maxwell, MD  potassium chloride SA (KLOR-CON M) 20 MEQ tablet TAKE ONE  TABLET BY MOUTH ONCE daily 07/06/22   Cox, Elnita Maxwell, MD  Probiotic CAPS Take 1 capsule by mouth daily.    [provider]  promethazine (PHENERGAN) 25 MG tablet TAKE ONE TABLET BY MOUTH daily AS NEEDED 06/06/22   Lillard Anes, MD  torsemide (DEMADEX) 20 MG tablet TAKE 4 TABLETS BY MOUTH ONCE DAILY 04/09/22   Bensimhon, Shaune Pascal, MD     Allergies:    Allergies  Allergen Reactions   Naproxen Hives and Rash   Celecoxib Other (See Comments)    Stomach pain   Aspirin Other (See Comments)    CAN NOT TAKE DUE TO ULCERS (documented previously) but takes every day currently (as of 09/29/20) without reported issues Can take coated aspirin   Glucosamine Hives, Swelling and Other (See Comments)    Angioedema    Iron     IV iron transfusion - hives - Feb 2022 hospitalization   Metoclopramide Other (See Comments)    Facial twitching and stuttering   Metolazone Other (See Comments)    Acute renal failure   Other Hives    Strawberry allergy   Shellfish-Derived Products Hives, Swelling and Other (See Comments)    Angioedema    Statins Other (See Comments)    Muscle pain   Strawberry (Diagnostic)    Strawberry Extract    Strawberry Flavor    Strawberry Leaves Extract    Tramadol Nausea And Vomiting         Social History:   Social History   Socioeconomic History   Marital status: Married    Spouse name: [name]   Number of children: 2   Years of education: 16   Highest education level: Master's degree (e.g., MA, MS, MEng, MEd, MSW, MBA)  Occupational History   Occupation: on disability/retired early former Education officer, museum  Tobacco Use   Smoking status: Former    Types: Cigarettes    Start date: 1983    Quit date: 1984    Years since quitting: 40.1    Passive exposure: Past   Smokeless tobacco: Never  Vaping Use   Vaping Use: Never used  Substance and Sexual Activity   Alcohol  use: Never   Drug use: Never   Sexual activity: Not on file  Other Topics Concern   Not on file  Social History Narrative   Not on file   Social Determinants of Health   Financial Resource Strain: Medium Risk (08/21/2022)   Overall Financial Resource Strain (CARDIA)    Difficulty of Paying Living Expenses: Somewhat hard  Food Insecurity: Food Insecurity Present (07/26/2022)   Hunger Vital Sign    Worried About Running Out of Food in the Last Year: Sometimes true    Ran Out of Food in the Last Year: Sometimes true  Transportation Needs: No Transportation Needs (07/25/2022)   PRAPARE - Hydrologist (Medical): No    Lack of Transportation (Non-Medical): No  Physical Activity: Inactive (08/21/2022)   Exercise Vital Sign    Days of Exercise per Week: 0 days    Minutes of Exercise per Session: 0 min  Stress: Stress Concern Present (08/01/2022)   [place]    Feeling of Stress : To some extent  Social Connections: Moderately Integrated (07/25/2022)   Social Connection and Isolation Panel [NHANES]    Frequency of Communication with Friends and Family: More than three times a week    Frequency of Social Gatherings with Friends  and Family: More than three times a week    Attends Religious Services: More than 4 times per year    Active Member of Clubs or Organizations: No    Attends Archivist Meetings: Never    Marital Status: Married  Human resources officer Violence: Not At Risk (07/25/2022)   Humiliation, Afraid, Rape, and Kick questionnaire    Fear of Current or Ex-Partner: No    Emotionally Abused: No    Physically Abused: No    Sexually Abused: No    Family History:   The patient's family history includes Bipolar disorder in an other family member; Breast cancer (age of onset: 32) in her mother; Cancer in an other family member; Coronary artery disease in her mother; Depression in an other  family member; Diabetes in her father; Diabetes type II in her mother; Hyperlipidemia in her mother; Hypertension in her father and mother; Lung cancer (age of onset: 62) in her mother; Mental illness in her sister; Schizophrenia in an other family member.    ROS:  Please see the history of present illness.  All other ROS reviewed and negative.     Physical Exam/Data:   Vitals:   09/07/22 0951 09/07/22 0957 09/07/22 1130 09/07/22 1227  BP: (!) 156/60  (!) 136/55 (!) 138/54  Pulse: 75  76 74  Resp: 18  12   Temp: 97.7 F (36.5 C)     TempSrc: Oral     SpO2: 94%  93%   Weight:  104.8 kg    Height:  '5\' 4"'$  (1.626 m)     No intake or output data in the 24 hours ending 09/07/22 1408    09/07/2022    9:57 AM 08/31/2022    7:39 AM 08/30/2022   11:14 AM  Last 3 Weights  Weight (lbs) 231 lb 232 lb 233 lb 6.4 oz  Weight (kg) 104.781 kg 105.235 kg 105.87 kg     Body mass index is 39.65 kg/m.  General:  Well nourished, well developed, in no acute distress HEENT: normal Neck: difficult to assess JVP with body habitus Vascular: No carotid bruits; Distal pulses 2+ bilaterally   Cardiac:  normal S1, S2; RRR; very quiet systolic murmur at apex Lungs:  clear to auscultation bilaterally, no wheezing, rhonchi or rales  Abd: soft, nontender, no hepatomegaly  Ext: stable bilateral lower extremity edema. Patient wearing compression socks but appears to be 1+. Musculoskeletal:  No deformities, BUE and BLE strength normal and equal Skin: warm and dry  Neuro:  CNs 2-12 intact, no focal abnormalities noted Psych:  Normal affect    EKG:  The ECG that was done 2/23 was personally reviewed and demonstrates sinus rhythm. Non-specific T wave changes in low lateral leads  Relevant CV Studies:  07/18/22 TTE  IMPRESSIONS     1. Left ventricular ejection fraction, by estimation, is 55 to 60%. The  left ventricle has normal function. The left ventricle has no regional  wall motion abnormalities.  Indeterminate diastolic filling due to E-A  fusion.   2. Right ventricular systolic function is normal. The right ventricular  size is normal.   3. Left atrial size was mildly dilated.   4. The mitral valve has been repaired/replaced. Moderate mitral valve  regurgitation. The mean mitral valve gradient is 7.5 mmHg with average  heart rate of 75 bpm. There is a Mitra-Clip present in the mitral  position. Procedure Date: 05/18/2021.   5. The aortic valve is grossly normal. Aortic valve regurgitation  is not  visualized. No aortic stenosis is present.   6. The inferior vena cava is normal in size with greater than 50%  respiratory variability, suggesting right atrial pressure of 3 mmHg.   Comparison(s): No significant change from prior study.   Conclusion(s)/Recommendation(s): S/P Mitraclip x2. Study unchanged from  prior.   FINDINGS   Left Ventricle: Left ventricular ejection fraction, by estimation, is 55  to 60%. The left ventricle has normal function. The left ventricle has no  regional wall motion abnormalities. Definity contrast agent was given IV  to delineate the left ventricular   endocardial borders. The left ventricular internal cavity size was normal  in size. There is no left ventricular hypertrophy. Indeterminate diastolic  filling due to E-A fusion.   Right Ventricle: The right ventricular size is normal. No increase in  right ventricular wall thickness. Right ventricular systolic function is  normal.   Left Atrium: Left atrial size was mildly dilated.   Right Atrium: Right atrial size was normal in size.   Pericardium: There is no evidence of pericardial effusion.   Mitral Valve: The mitral valve has been repaired/replaced. Moderate mitral  valve regurgitation. There is a Mitra-Clip present in the mitral position.  Procedure Date: 05/18/2021. MV peak gradient, 20.8 mmHg. The mean mitral  valve gradient is 7.5 mmHg with  average heart rate of 75 bpm.   Tricuspid  Valve: The tricuspid valve is normal in structure. Tricuspid  valve regurgitation is trivial. No evidence of tricuspid stenosis.   Aortic Valve: The aortic valve is grossly normal. Aortic valve  regurgitation is not visualized. No aortic stenosis is present.   Pulmonic Valve: The pulmonic valve was not well visualized. Pulmonic valve  regurgitation is trivial. No evidence of pulmonic stenosis.   Aorta: The aortic root, ascending aorta, aortic arch and descending aorta  are all structurally normal, with no evidence of dilitation or  obstruction.   Venous: The inferior vena cava is normal in size with greater than 50%  respiratory variability, suggesting right atrial pressure of 3 mmHg.   IAS/Shunts: The atrial septum is grossly normal.   09/29/20 LHC  1. 3 vessel CAD with moderate mid-LAD and mid-RCA stenoses, and severe mid-LCx stenosis 2. Successful PCI of the mid-circumflex, reducing severe 90% stenosis to 0% with a 2.75x22 mm Resolute Onyx DES, provisional side branch angioplasty of the OM1 with a 2.5 mm balloon through the stent struts 3. Moderately elevated LVEDP   Recommend: at least 12 motnhs DAPT with ASA and plavix, favor long-term if tolerated  Laboratory Data:  High Sensitivity Troponin:   Recent Labs  Lab 09/07/22 1013  TROPONINIHS 39*      Chemistry Recent Labs  Lab 09/07/22 1013  NA 136  K 3.9  CL 101  CO2 24  GLUCOSE 169*  BUN 64*  CREATININE 1.78*  CALCIUM 9.1  GFRNONAA 32*  ANIONGAP 11    Recent Labs  Lab 09/07/22 1013  PROT 7.4  ALBUMIN 3.3*  AST 33  ALT 32  ALKPHOS 159*  BILITOT 0.7   Lipids No results for input(s): "CHOL", "TRIG", "HDL", "LABVLDL", "LDLCALC", "CHOLHDL" in the last 168 hours. Hematology Recent Labs  Lab 09/07/22 1013  WBC 11.2*  RBC 3.79*  HGB 10.1*  HCT 33.4*  MCV 88.1  MCH 26.6  MCHC 30.2  RDW 16.0*  PLT 248   Thyroid No results for input(s): "TSH", "FREET4" in the last 168 hours. BNP Recent Labs  Lab  09/07/22 1013  BNP 168.7*  DDimer No results for input(s): "DDIMER" in the last 168 hours.   Radiology/Studies:  DG Chest 2 View  Result Date: 09/07/2022 CLINICAL DATA:  Provided history: Chest pain. EXAM: CHEST - 2 VIEW COMPARISON:  Prior chest radiographs 08/07/2022. FINDINGS: Mild cardiomegaly. Redemonstrated MitraClip device. An additional device projects over the left superior aspect of the cardiomediastinal silhouette. Correlate with the procedural history. Central pulmonary vascular congestion without overt pulmonary edema. Ill-defined opacities within the left lung base. No appreciable airspace consolidation on the right. No evidence of pleural effusion or pneumothorax. Spondylosis and dextrocurvature of the thoracic spine. IMPRESSION: Ill-defined opacities within the left lung base, which may reflect atelectasis and/or pneumonia. Cardiomegaly with central pulmonary vascular congestion. No overt pulmonary edema. Electronically Signed   By: Kellie Simmering D.O.   On: 09/07/2022 10:37     Assessment and Plan:   Chest pain CAD s/p DES x3  Ms. Fess presented to the ED today with symptoms of chest pain that began this morning while waking up. This pain was stabbing/sharp in nature, located left side of chest and radiating into the shoulder. She also felt like her shoulder became heavy. She says that this pain is almost exactly the same as what she felt prior to previous MI. Troponin 39->40. ECG with non-specific T wave changes low lateral leads.  Given known CAD history, patient will need ischemic evaluation of new unstable angina. LHC today.  Patient loaded with ASA in ED. Plan to continue GDMT including Corg 12.'5mg'$  BID, Plavix '75mg'$ , Zetia '10mg'$ , Imdur '30mg'$ , Repatha.   Shared Decision Making/Informed Consent The risks [stroke (1 in 1000), death (1 in 1000), kidney failure [usually temporary] (1 in 500), bleeding (1 in 200), allergic reaction [possibly serious] (1 in 200)], benefits  (diagnostic support and management of coronary artery disease) and alternatives of a cardiac catheterization were discussed in detail with Ms. Ogasawara and she is willing to proceed.  Chronic diastolic CHF  TTE [id number] with LVEF 550-60%, normal RV, stable moderate MR with mean gradient 7.8mHg. Patient appears to be at baseline volume, no pulmonary edema on physical exam. Stable lower extremity edema. No worsened orthopnea or exertional intolerance at home.   Continue Hydralazine '25mg'$  TID, Coreg 12.'5mg'$  BID, Imdur '30mg'$  QD.  Continue PRN Furosemide. Patient closely monitors weight and volume status.  Not SGLT2 candidate, type I DM.   Mitral regurgitation  Patient s/p TEER 05/18/21 with 2 clips placed. Stable moderate MR with mean gradient 7.574mg on 07/18/22 TTE.   CKD stage IIIb  Patient with recent baseline creatinine ~2. 1.78 on admission today. Will need close monitoring following LHC today with hx contrast induced nephropathy. [facility]  OSA  Continue nightly CPAP use  DM type I  Stable on Levemir.  Lab Results  Component Value Date   HGBA1C 7.4 (H) 08/31/2022   Chronic anemia  Attributed to CKD. Patient is followed by heme/onc and receives Retacrit.   Risk Assessment/Risk Scores:    TIMI Risk Score for Unstable Angina or Non-ST Elevation MI:   The patient's TIMI risk score is  , which indicates a  % risk of all cause mortality, new or recurrent myocardial infarction or need for urgent revascularization in the next 14 days.  New York Heart Association (NYHA) Functional Class NYHA Class II     Severity of Illness: The appropriate patient status for this patient is OBSERVATION. Observation status is judged to be reasonable and necessary in order to provide the required intensity  of service to ensure the patient's safety. The patient's presenting symptoms, physical exam findings, and initial radiographic and laboratory data in the context  of their medical condition is felt to place them at decreased risk for further clinical deterioration. Furthermore, it is anticipated that the patient will be medically stable for discharge from the hospital within 2 midnights of admission.    For questions or updates, please contact [place] Please consult [email or web address] for contact info under     Signed, Lily Kocher, PA-C  09/07/2022 2:08 PM   Personally seen and examined. Agree with above.  62 year old female with known CAD, diastolic heart failure, CardioMEMS placed in 2019-stable last week from a fluid status standpoint when she saw Dr. [name] who arrived with chest discomfort relieved with nitroglycerin.  Has both typical as well as atypical features.  Nitroglycerin did help.  She stated that this felt very similar to her prior heart attack.  Thankfully her cardiac troponins are only mildly elevated in the 40 range.  Creatinine 1.78 and stable.  Has chronic kidney disease stage IIIb.  On exam mildly anxious but alert and oriented x 3.  Husband and daughter in room.  No significant crackles on exam.  Regular rate and rhythm.  Cardiac catheterization 09/29/2020 Dominance: Right  Intervention    With her symptoms of chest discomfort and known CAD status post DES x 3 we will go ahead and proceed with cardiac catheterization.  We did discuss possibility of renal impairment in addition to other risks as above.  Her creatinine is currently at baseline.  Will use dye sparing procedure.  Continue with current meds which include hydralazine carvedilol isosorbide.  She is not an SGLT2 candidate given her diabetes type 1.    Mitral regurgitation status post 2 clips in 2022.  OSA-CPAP diabetes on Levemir  Candee Furbish, MD

## 2022-09-07 NOTE — ED Triage Notes (Signed)
Pt. Stated, I started having chest pain this morning around 830 with some Nausea, SOB. I have CHF so I have swelling but its more than normal

## 2022-09-07 NOTE — ED Provider Notes (Signed)
[facility] Provider Note   CSN: [id number] Arrival date & time: 09/07/22  0945     History CAD, CHF,CKD Chief Complaint  Patient presents with   Chest Pain   Nausea   Leg Swelling   Shortness of Breath   left arm heaviness    Dana [name] is a 62 y.o. female.  62 year old female with a past medical history of CAD presents to the ED with a chief complaint of chest pain.  Patient reports waking up this morning while she was in bed began to experience some stabbing pain to the left side of her chest radiating to her left shoulder.  She reports this pain was severe in nature about a 10, did take a nitro which did not help with symptoms, therefore she took a second nitro and that also did not help with her symptoms.  She reports feeling nauseous at the time, was having some shortness of breath with this episode.  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  She reports after the third nitro her pain went from a 10 to a 7.  The pain was described as sporadically, later the pain began constant.  She is followed by Dr. [name] who she actually saw last week.  She denies any tobacco use, no recent fevers, no cough or worsen leg swelling.   The history is provided by the patient and medical records.  Chest Pain Pain location:  L chest Pain quality: stabbing   Pain radiates to:  Does not radiate Pain severity:  Severe Onset quality:  Sudden Duration:  2 hours Timing:  Constant Progression:  Worsening Chronicity:  New Relieved by:  Nothing Worsened by:  Nothing Ineffective treatments:  Nitroglycerin Associated symptoms: fever, nausea and shortness of breath   Associated symptoms: no cough, no headache and no numbness   Shortness of Breath Associated symptoms: chest pain and fever   Associated symptoms: no cough and no headaches        Home Medications Prior to Admission medications    Medication Sig Start Date End Date Taking? Authorizing Provider  allopurinol (ZYLOPRIM) 300 MG tablet Take 1 tablet (300 mg total) by mouth daily. 07/05/22   Cox, Elnita Maxwell, MD  busPIRone (BUSPAR) 5 MG tablet Take 1 tablet (5 mg total) by mouth 3 (three) times daily. 04/10/22   Cox, Elnita Maxwell, MD  carvedilol (COREG) 12.5 MG tablet TAKE ONE TABLET BY MOUTH TWICE DAILY 01/09/22   Rafael Bihari, FNP  clopidogrel (PLAVIX) 75 MG tablet Take 1 tablet (75 mg total) by mouth daily. 07/05/22   CoxElnita Maxwell, MD  Continuous Blood Gluc Sensor (FREESTYLE LIBRE 2 SENSOR) MISC USE TO check blood glucose AS DIRECTED AND CHANGE sensor every 14 DAYS 01/11/22   [provider]  CRANBERRY PO Take 2 tablets by mouth daily. 2 gummies    [provider]  EPINEPHrine 0.3 mg/0.3 mL IJ SOAJ injection Use as directed for life-threatening allergic reaction. 06/24/18   Kennith Gain, MD  Evolocumab (REPATHA SURECLICK) [id number] MG/ML SOAJ Inject 1 Dose into the skin every 14 (fourteen) days. 01/17/22   Hilty, Nadean Corwin, MD  ezetimibe (ZETIA) 10 MG tablet Take 1 tablet (10 mg total) by mouth daily. 10/11/21   Early Osmond, MD  hydrALAZINE (APRESOLINE) 25 MG tablet Take 1 tablet (25 mg total) by mouth 3 (three) times daily. 02/02/22   Rafael Bihari, FNP  HYDROcodone-acetaminophen (NORCO) 7.5-325 MG tablet TAKE ONE TABLET BY MOUTH THREE TIMES DAILY AS NEEDED 08/29/22   Cox, Elnita Maxwell, MD  insulin aspart (NOVOLOG FLEXPEN) 100 UNIT/ML FlexPen 2-10 U before meals based on SSI. 04/10/22   Cox, Kirsten, MD  Insulin Pen Needle (BD PEN NEEDLE NANO 2ND GEN) 32G X 4 MM MISC Inject 1 each into the skin in the morning, at noon, in the evening, and at bedtime. 08/30/21   CoxElnita Maxwell, MD  isosorbide mononitrate (IMDUR) 30 MG 24 hr tablet Take 1 tablet (30 mg total) by mouth daily. 07/05/22   Cox, Elnita Maxwell, MD  lactulose (CHRONULAC) 10 GM/15ML solution Take 15 mLs (10 g total) by mouth daily as needed for mild  constipation. 08/30/21   Cox, Elnita Maxwell, MD  latanoprost (XALATAN) 0.005 % ophthalmic solution Place 1 drop into both eyes at bedtime.    [provider]  LEVEMIR FLEXPEN 100 UNIT/ML FlexPen Inject 50 units into THE SKIN daily 07/30/22   Cox, Elnita Maxwell, MD  linaclotide [facility]) 145 MCG CAPS capsule Take 1 capsule (145 mcg total) by mouth daily before breakfast. 08/31/22   Cox, Kirsten, MD  LORazepam (ATIVAN) 0.5 MG tablet TAKE ONE TABLET BY MOUTH ONCE daily AS NEEDED FOR ANXIETY 07/22/22   Cox, Kirsten, MD  magnesium oxide (MAG-OX) 400 MG tablet Take 2 tablets (800 mg total) by mouth daily. Take 2 (400 mg) daily=800 mg 04/09/18   Shirley Friar, PA-C  metolazone (ZAROXOLYN) 2.5 MG tablet Take 1 tablet (2.5 mg total) by mouth as needed. 08/09/22 11/07/22  Consuelo Pandy, PA-C  nitroGLYCERIN (NITROSTAT) 0.4 MG SL tablet Dissolve 1 tab under tongue as needed for chest pain. May repeat every 5 minutes x 2 doses. If no relief call 9-1-1. Please call [phone] to schedule an appointment for future refills. Thank you. 07/13/22   Eileen Stanford, PA-C  Omega-3 Fatty Acids (FISH OIL PO) Take 1 capsule by mouth daily.    [provider]  OXYGEN Inhale 2 L into the lungs at bedtime.    [provider]  pantoprazole (PROTONIX) 40 MG tablet Take 1 tablet (40 mg total) by mouth daily. 07/05/22   Cox, Elnita Maxwell, MD  potassium chloride SA (KLOR-CON M) 20 MEQ tablet TAKE ONE TABLET BY MOUTH ONCE daily 07/06/22   Cox, Elnita Maxwell, MD  Probiotic CAPS Take 1 capsule by mouth daily.    [provider]  promethazine (PHENERGAN) 25 MG tablet TAKE ONE TABLET BY MOUTH daily AS NEEDED 06/06/22   Lillard Anes, MD  torsemide (DEMADEX) 20 MG tablet TAKE 4 TABLETS BY MOUTH ONCE DAILY 04/09/22   Bensimhon, Shaune Pascal, MD      Allergies    Naproxen, Celecoxib, Aspirin, Glucosamine, Iron, Metoclopramide, Metolazone, Other, Shellfish-derived products, Statins, Strawberry (diagnostic),  Strawberry extract, Strawberry flavor, Strawberry leaves extract, and Tramadol    Review of Systems   Review of Systems  Constitutional:  Positive for fever.  Respiratory:  Positive for shortness of breath. Negative for cough.   Cardiovascular:  Positive for chest pain.  Gastrointestinal:  Positive for nausea.  Genitourinary:  Negative for flank pain.  Neurological:  Negative for numbness and headaches.  All other systems reviewed and are negative.   Physical Exam Updated Vital Signs BP (!) 146/64   Pulse 78   Temp 97.7 F (36.5 C) (Oral)   Resp 17   Ht '5\' 4"'$  (1.626 m)   Wt 104.8 kg   SpO2 95%   BMI 39.65 kg/m  Physical Exam  Vitals and nursing note reviewed.  Constitutional:      Appearance: She is well-developed.  HENT:     Head: Normocephalic and atraumatic.  Cardiovascular:     Rate and Rhythm: Normal rate.  Pulmonary:     Effort: Pulmonary effort is normal.     Breath sounds: Normal breath sounds. No decreased breath sounds.     Comments: Lungs are slightly diminished to auscultation but without any obvious wheezing or rales. Musculoskeletal:     Cervical back: Normal range of motion and neck supple.     Right lower leg: No edema.     Left lower leg: No edema.  Skin:    General: Skin is warm and dry.  Neurological:     Mental Status: She is alert and oriented to person, place, and time.     ED Results / Procedures / Treatments   Labs (all labs ordered are listed, but only abnormal results are displayed) Labs Reviewed  BASIC METABOLIC PANEL - Abnormal; Notable for the following components:      Result Value   Glucose, Bld 169 (*)    BUN 64 (*)    Creatinine, Ser 1.78 (*)    GFR, Estimated 32 (*)    All other components within normal limits  CBC - Abnormal; Notable for the following components:   WBC 11.2 (*)    RBC 3.79 (*)    Hemoglobin 10.1 (*)    HCT 33.4 (*)    RDW 16.0 (*)    All other components within normal limits  BRAIN NATRIURETIC PEPTIDE  - Abnormal; Notable for the following components:   B Natriuretic Peptide 168.7 (*)    All other components within normal limits  HEPATIC FUNCTION PANEL - Abnormal; Notable for the following components:   Albumin 3.3 (*)    Alkaline Phosphatase 159 (*)    All other components within normal limits  TROPONIN I (HIGH SENSITIVITY) - Abnormal; Notable for the following components:   Troponin I (High Sensitivity) 39 (*)    All other components within normal limits  TROPONIN I (HIGH SENSITIVITY) - Abnormal; Notable for the following components:   Troponin I (High Sensitivity) 40 (*)    All other components within normal limits  HIV ANTIBODY (ROUTINE TESTING W REFLEX)  CBC  CREATININE, SERUM    EKG EKG Interpretation  Date/Time:  Friday September 07 2022 12:44:56 EST Ventricular Rate:  74 PR Interval:  167 QRS Duration: 105 QT Interval:  440 QTC Calculation: 489 R Axis:   107 Text Interpretation: Sinus rhythm Right axis deviation Low voltage, precordial leads Borderline T abnormalities, diffuse leads Borderline prolonged QT interval No acute changes No significant change since last tracing Confirmed by Varney Biles [id number]) on 09/07/2022 12:56:54 PM  Radiology DG Chest 2 View  Result Date: 09/07/2022 CLINICAL DATA:  Provided history: Chest pain. EXAM: CHEST - 2 VIEW COMPARISON:  Prior chest radiographs 08/07/2022. FINDINGS: Mild cardiomegaly. Redemonstrated MitraClip device. An additional device projects over the left superior aspect of the cardiomediastinal silhouette. Correlate with the procedural history. Central pulmonary vascular congestion without overt pulmonary edema. Ill-defined opacities within the left lung base. No appreciable airspace consolidation on the right. No evidence of pleural effusion or pneumothorax. Spondylosis and dextrocurvature of the thoracic spine. IMPRESSION: Ill-defined opacities within the left lung base, which may reflect atelectasis and/or pneumonia.  Cardiomegaly with central pulmonary vascular congestion. No overt pulmonary edema. Electronically Signed   By: Kellie Simmering D.O.   On: 09/07/2022 10:37  Procedures Procedures    Medications Ordered in ED Medications  nitroGLYCERIN (NITROGLYN) 2 % ointment 1 inch ( Topical Automatically Held 09/15/22 1800)  aspirin tablet 325 mg ( Oral Automatically Held 09/15/22 1000)  nitroGLYCERIN (NITROSTAT) SL tablet 0.4 mg (has no administration in time range)  acetaminophen (TYLENOL) tablet 650 mg (has no administration in time range)  ondansetron (ZOFRAN) injection 4 mg (has no administration in time range)  enoxaparin (LOVENOX) injection 40 mg (has no administration in time range)    ED Course/ Medical Decision Making/ A&P                             Medical Decision Making Amount and/or Complexity of Data Reviewed Labs: ordered. Radiology: ordered.  Risk OTC drugs. Prescription drug management.   This patient presents to the ED for concern of chest pain, this involves a number of treatment options, and is a complaint that carries with it a high risk of complications and morbidity.  The differential diagnosis includes ACS, PE, infection versus MSK.   Co morbidities: Discussed in HPI   Brief History:  Patient with underlying history of CAD here with sharp stabbing pain to the left side of her chest radiating to her left shoulder which began while she was resting in bed today.  Did take nitroglycerin x 3 without much improvement in her symptoms.  Followed by cardiologist Dr. [name] who she saw last week.  EMR reviewed including pt PMHx, past surgical history and past visits to ER.   See HPI for more details   Lab Tests:  I ordered and independently interpreted labs.  The pertinent results include:    Labs notable for BMP with an elevated creatinine but improved from her baseline.  BUN is slightly elevated at 64.  No electrolyte derangement.  CBC with a slight leukocytosis,  hemoglobin is within normal limits.  Hepatic function without any elevation of her LFTs.  First troponin is 39, will obtain delta.   Imaging Studies:  DG Chest showed: Ill-defined opacities within the left lung base, which may reflect  atelectasis and/or pneumonia.    Cardiomegaly with central pulmonary vascular congestion. No overt  pulmonary edema.    Cardiac Monitoring:  The patient was maintained on a cardiac monitor.  I personally viewed and interpreted the cardiac monitored which showed an underlying rhythm of: NSR EKG non-ischemic   Medicines ordered:  I ordered medication including nitro paste, aspirin  for pain control Reevaluation of the patient after these medicines showed that the patient stayed the same I have reviewed the patients home medicines and have made adjustments as needed  Consults:  10:48 AM I requested consultation with Cardiology,  and discussed lab and imaging findings as well as pertinent plan - they will evaluate patient in the ED.    Reevaluation:  After the interventions noted above I re-evaluated patient and found that they have :improved  Social Determinants of Health:  The patient's social determinants of health were a factor in the care of this patient   Problem List / ED Course:  Patient presents to the ED after experiencing chest pain upon waking up at 830 this morning.  Prior history of CAD currently on Plavix.  Reports taking nitro x 3 without any improvement in symptoms.  Symptoms have persisted therefore she arrived in the emergency department would continue ongoing pain.  Nitro paste along with aspirin 325 was given to patient.  Interpretation of her  labs reveal a CBC with a slight leukocytosis.  Hemoglobin is within normal limits.  BMP with a stable creatinine at her baseline.  LFTs are within normal limits.  First troponin was elevated at 39, I consulted cardiology who will evaluate patient.  BNP slightly elevated at 168, I do not  see any signs of fluid overload on my exam.  No pitting edema, no crackles noted.  She does not report any weight gain as well.  She does have some improvement after medication was given.  Cardiology did evaluate patient.  Chest x-ray that showed perhaps consolation versus pneumonia, she does not have any fever, cough, no signs of infection at this time.  I do not feel that patient has pneumonia.  She was evaluated by cardiology and they do feel that she needs intervention at this time.  Patient has only had small sips of juice that she does have a history of ongoing diabetes.  Patient will go for heart catheterization today.   Dispostion:  After consideration of the diagnostic results and the patients response to treatment, I feel that the patent would benefit from evaluation by Cardiology.     Portions of this note were generated with [employer]. Dictation errors may occur despite best attempts at proofreading.   Final Clinical Impression(s) / ED Diagnoses Final diagnoses:  Chest pain, unspecified type    Rx / DC Orders ED Discharge Orders     None         Janeece Fitting, PA-C 09/07/22 [place], MD 09/08/22 1009

## 2022-09-07 NOTE — Progress Notes (Addendum)
Small hematoma present distal and medial to sheath insertion site. Area is 2 inch by 2 inch hard and already blue. Arterial sheath aspirated and removed from right femoral artery. Manual pressure applied for 20 minutes. Site level 1 only due to preexisting hematoma. No additional hematoma noted.  Tegaderm dressing applied, bedrest instructions given. Right dp and pt pulses palpable.    Bedrest begins at 08:50:00

## 2022-09-07 NOTE — Interval H&P Note (Signed)
History and Physical Interval Note:  09/07/2022 3:46 PM  Dana [name]  has presented today for surgery, with the diagnosis of unstable angina.  The various methods of treatment have been discussed with the patient and family. After consideration of risks, benefits and other options for treatment, the patient has consented to  Procedure(s): LEFT HEART CATH AND CORONARY ANGIOGRAPHY (N/A) as a surgical intervention.  The patient's history has been reviewed, patient examined, no change in status, stable for surgery.  I have reviewed the patient's chart and labs.  Questions were answered to the patient's satisfaction.   Cath Lab Visit (complete for each Cath Lab visit)  Clinical Evaluation Leading to the Procedure:   ACS: Yes.    Non-ACS:    Anginal Classification: CCS IV  Anti-ischemic medical therapy: Maximal Therapy (2 or more classes of medications)  Non-Invasive Test Results: No non-invasive testing performed  Prior CABG: No previous CABG        Collier Salina [facility] 09/07/2022 3:46 PM

## 2022-09-08 DIAGNOSIS — G4733 Obstructive sleep apnea (adult) (pediatric): Secondary | ICD-10-CM | POA: Diagnosis present

## 2022-09-08 DIAGNOSIS — M069 Rheumatoid arthritis, unspecified: Secondary | ICD-10-CM | POA: Diagnosis not present

## 2022-09-08 DIAGNOSIS — Z954 Presence of other heart-valve replacement: Secondary | ICD-10-CM | POA: Diagnosis not present

## 2022-09-08 DIAGNOSIS — I130 Hypertensive heart and chronic kidney disease with heart failure and stage 1 through stage 4 chronic kidney disease, or unspecified chronic kidney disease: Secondary | ICD-10-CM | POA: Diagnosis not present

## 2022-09-08 DIAGNOSIS — I252 Old myocardial infarction: Secondary | ICD-10-CM | POA: Diagnosis not present

## 2022-09-08 DIAGNOSIS — D631 Anemia in chronic kidney disease: Secondary | ICD-10-CM | POA: Diagnosis not present

## 2022-09-08 DIAGNOSIS — N1832 Chronic kidney disease, stage 3b: Secondary | ICD-10-CM | POA: Diagnosis not present

## 2022-09-08 DIAGNOSIS — I5042 Chronic combined systolic (congestive) and diastolic (congestive) heart failure: Secondary | ICD-10-CM | POA: Diagnosis not present

## 2022-09-08 DIAGNOSIS — Z79899 Other long term (current) drug therapy: Secondary | ICD-10-CM | POA: Diagnosis not present

## 2022-09-08 DIAGNOSIS — Z9049 Acquired absence of other specified parts of digestive tract: Secondary | ICD-10-CM | POA: Diagnosis not present

## 2022-09-08 DIAGNOSIS — I340 Nonrheumatic mitral (valve) insufficiency: Secondary | ICD-10-CM | POA: Diagnosis present

## 2022-09-08 DIAGNOSIS — E1022 Type 1 diabetes mellitus with diabetic chronic kidney disease: Secondary | ICD-10-CM | POA: Diagnosis present

## 2022-09-08 DIAGNOSIS — I25110 Atherosclerotic heart disease of native coronary artery with unstable angina pectoris: Secondary | ICD-10-CM | POA: Diagnosis not present

## 2022-09-08 DIAGNOSIS — G931 Anoxic brain damage, not elsewhere classified: Secondary | ICD-10-CM | POA: Diagnosis not present

## 2022-09-08 DIAGNOSIS — Z7902 Long term (current) use of antithrombotics/antiplatelets: Secondary | ICD-10-CM | POA: Diagnosis not present

## 2022-09-08 DIAGNOSIS — R079 Chest pain, unspecified: Secondary | ICD-10-CM | POA: Diagnosis present

## 2022-09-08 DIAGNOSIS — I200 Unstable angina: Secondary | ICD-10-CM | POA: Diagnosis not present

## 2022-09-08 DIAGNOSIS — Z955 Presence of coronary angioplasty implant and graft: Secondary | ICD-10-CM | POA: Diagnosis not present

## 2022-09-08 DIAGNOSIS — E782 Mixed hyperlipidemia: Secondary | ICD-10-CM | POA: Diagnosis present

## 2022-09-08 DIAGNOSIS — E1042 Type 1 diabetes mellitus with diabetic polyneuropathy: Secondary | ICD-10-CM | POA: Diagnosis not present

## 2022-09-08 DIAGNOSIS — M1009 Idiopathic gout, multiple sites: Secondary | ICD-10-CM | POA: Diagnosis present

## 2022-09-08 DIAGNOSIS — E1051 Type 1 diabetes mellitus with diabetic peripheral angiopathy without gangrene: Secondary | ICD-10-CM | POA: Diagnosis not present

## 2022-09-08 DIAGNOSIS — J449 Chronic obstructive pulmonary disease, unspecified: Secondary | ICD-10-CM | POA: Diagnosis not present

## 2022-09-08 DIAGNOSIS — K219 Gastro-esophageal reflux disease without esophagitis: Secondary | ICD-10-CM | POA: Diagnosis present

## 2022-09-08 DIAGNOSIS — I480 Paroxysmal atrial fibrillation: Secondary | ICD-10-CM | POA: Diagnosis present

## 2022-09-08 LAB — BASIC METABOLIC PANEL
Anion gap: 10 (ref 5–15)
BUN: 57 mg/dL — ABNORMAL HIGH (ref 6–20)
CO2: 22 mmol/L (ref 22–32)
Calcium: 8.8 mg/dL — ABNORMAL LOW (ref 8.9–10.3)
Chloride: 107 mmol/L (ref 98–111)
Creatinine, Ser: 1.94 mg/dL — ABNORMAL HIGH (ref 0.44–1.00)
GFR, Estimated: 29 mL/min — ABNORMAL LOW (ref >60)
Glucose, Bld: 172 mg/dL — ABNORMAL HIGH (ref 70–99)
Potassium: 4.3 mmol/L (ref 3.5–5.1)
Sodium: 139 mmol/L (ref 135–145)

## 2022-09-08 LAB — CBC
HCT: 32.8 % — ABNORMAL LOW (ref 36.0–46.0)
Hemoglobin: 10.4 g/dL — ABNORMAL LOW (ref 12.0–15.0)
MCH: 27.1 pg (ref 26.0–34.0)
MCHC: 31.7 g/dL (ref 30.0–36.0)
MCV: 85.4 fL (ref 80.0–100.0)
Platelets: 251 10*3/uL (ref 150–400)
RBC: 3.84 MIL/uL — ABNORMAL LOW (ref 3.87–5.11)
RDW: 15.8 % — ABNORMAL HIGH (ref 11.5–15.5)
WBC: 18 10*3/uL — ABNORMAL HIGH (ref 4.0–10.5)
nRBC: 0 % (ref 0.0–0.2)

## 2022-09-08 LAB — GLUCOSE, CAPILLARY
Glucose-Capillary: 280 mg/dL — ABNORMAL HIGH (ref 70–99)
Glucose-Capillary: 371 mg/dL — ABNORMAL HIGH (ref 70–99)

## 2022-09-08 MED ORDER — HYDRALAZINE HCL 50 MG PO TABS: 50.0000 mg | ORAL_TABLET | Freq: Three times a day (TID) | ORAL | 2 refills | Status: DC

## 2022-09-08 MED ORDER — FUROSEMIDE 10 MG/ML IJ SOLN
40.0000 mg | Freq: Once | INTRAMUSCULAR | Status: AC
  Administered 2022-09-08: 40 mg via INTRAVENOUS
  Filled 2022-09-08: qty 4

## 2022-09-08 MED ORDER — ASPIRIN 81 MG PO TBEC
81.0000 mg | DELAYED_RELEASE_TABLET | Freq: Every day | ORAL | Status: DC
  Administered 2022-09-08: 81 mg via ORAL
  Filled 2022-09-08: qty 1

## 2022-09-08 MED ORDER — ASPIRIN 81 MG PO TBEC: 81.0000 mg | DELAYED_RELEASE_TABLET | Freq: Every day | ORAL | 12 refills | Status: DC

## 2022-09-08 MED ORDER — HYDRALAZINE HCL 50 MG PO TABS
50.0000 mg | ORAL_TABLET | Freq: Three times a day (TID) | ORAL | Status: DC
  Administered 2022-09-08 (×2): 50 mg via ORAL
  Filled 2022-09-08: qty 1

## 2022-09-08 NOTE — Discharge Summary (Addendum)
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   Prox RCA lesion is 60% stenosed.   1st Mrg-1 lesion is 30% stenosed.   Mid Cx-2 lesion is 100% stenosed.   1st Mrg-2 lesion is 60% stenosed.   Prox LAD to Mid LAD lesion is 90% stenosed.   A drug-eluting stent was successfully placed using a SYNERGY XD 2.75X28.   Post intervention, there is a 0% residual stenosis.   Mid LAD lesion is 40% stenosed.   Non-stenotic Mid Cx-1 lesion was previously treated.   Non-stenotic Dist RCA lesion was previously treated.   Post intervention, there is a 0% residual stenosis.   LV end diastolic pressure is mildly elevated.   2 vessel obstructive CAD. New 90% stenosis in the proximal LAD with RFR 0.48. The stent in the LCx is occluded after a large first OM Patent stent in the distal RCA Mildly elevated LVEDP 21 mm Hg Successful PCI of the proximal LAD with DES x 1 overlapping with prior stent.    Plan: observe overnight. Anticipate DC in am. DAPT indefinitely with ASA/Plavix.  Will treat LCx occlusion medically.    _____________   History of Present Illness     Per admission H&P on 09/07/22:  Dana [name] is a 62 y.o. female with hx diastolic heart failure s/p cardiomems 02/2018,  CAD s/p DES to RCA, LAD, Cx, DM I, hypertension, hyperlipidemia, PAD s/p left great toe and 4th toe amputation, OSA on CPAP, PAF s/p ablation, CKD stage IIIb who was seen 09/07/2022 for the evaluation of chest pain.   Dana [name] presented to the ED 09/07/22 with symptoms of chest pain that began in the  morning while waking up. This pain was stabbing/sharp in nature, located left side of chest and radiating into the shoulder. She also felt like her shoulder became heavy. Patient took 2 nitroglycerin but did not notice relief of pain. Pain was associated with nausea and dyspnea. She tried sitting up but did not find that this improved pain. Patient eventually took a third nitro and pain reduced from 10/10 to 7/10. She said that this pain was almost exactly the same as what she felt prior to previous MI. The only difference this time was that pain radiates to shoulder rather that the middle of her back. She otherwise felt like her HF has been well managed/stable. Her weight has remained flat and she has not needed to increase the number of pillows she sleeps on (two), nor needed to take diuretics. Lower extremity is stable.    Upon arrival to the ED, patient was given a nitroglycerin patch and pain improved to 2/10. She says that it has remained stable at 2/10, no worse no better.   Patient with  complex cardiac history including MI with DES to RCA and LAD in 2018 as well as to mid circumflex in March of 2022. In the summer of 2022 patient was admitted to [facility] with DKA but subsequently developed pulmonary fascular congestion. She suffered a PEA cardiac arrest on 01/12/21. Patient had ROSC after 15 min ACLS. She was moved to [facility] and noted to have anoxic brain injury on MRI. Patient re-admitted October 2022 with heart failure exacerbation. This admission was complicated by AKI on CKD. RHC with mild to moderately elevated filling pressures, suggestive of severe MR vs severe diastolic dysfunction. TTE with  severe central MR and preserved LVEF. Patient had TEER of mitral valve in November 2022. Repeat echo March 2023 with LVEF 55-60%, moderate MR with gradient 7-61mHg.  TTE 07/18/22 with preserved LVEF, normal RV and moderate MR which was stable.    At last HF follow up on 08/30/22, Cardiomems stable with PAD 10-12.   Hospital Course     Consultants: N/A   Unstable angina CAD with multiple stents  - presented with angina at rest, similar to previous MI - Hs Troponin 39->40.  - ECG with non-specific T wave changes - A1C 7.4% and LDL 15 from outpatient labs on 08/31/22 , TSH not sent/may update outpatient  - LHC on 09/07/22 showed 2 vessel obstructive CAD. New 90% stenosis in the proximal LAD with RFR 0.48. The stent in the LCx is occluded after a large first OM; Patent stent in the distal RCA; Mildly elevated LVEDP 21 mm Hg; Successful PCI of the proximal LAD with DES x 1 overlapping with prior stent. Lcx occlusion to be treated medically.  - Recommend medical therapy including ASA '81mg'$  daily + Plavix '75mg'$  daily (DAPT indefinitely, ASA is new this admission, hx of ulcer, no ananaphylaxis per patient, already on Protonix), continue PTA meds Coreg 12.'5mg'$  BID,Imdur '30mg'$ , Repatha, and Zetia '10mg'$  daily  - Post cath care discussed in detail via phone  - Follow up with AHF clinic in 2 weeks, message sent to office staff for arranging this   Chronic diastolic CHF - Echo from [id number] LVEF 55-60%, normal RV, stable moderate MR with mean gradient 7.585mg.  - Exam stable for CHF at admission, weight was 231 ib at admission, 233 ib on 08/30/22 office visit - Note Mildly elevated LVEDP 21 mm Hg on LHC, more SOB 09/08/22 with ambulation, IV Lasix '40mg'$  x1 given, if improving, may DC tomorrow - continue Torsemide 80 mg daily and metolazone /Lasix as needed  - GDMT: Hydralazine '50mg'$  TID, Coreg 12.'5mg'$  BID, Imdur '30mg'$  QD; Not SGLT2 candidate, type I DM.  - Follow up with AHF clinic in 2 weeks, message sent to office  staff for arranging this   HTN - BP has been elevated up to 165/65, hydralazine increased from '25mg'$  to '50mg'$  TID this admission, continue PTA coreg and imdur    Mitral regurgitation - Severe central MR on TEE on 10/22.   - S/p TEER 05/18/2021 - two clips placed, one became entangled and had to be deployed. - Moderate residual MR on echo 11/22. - Recent Echo 07/2022 with stable moderate MR, mG 7.5 mmHg  - Structural team following. No indication for re-clip   CKD stage IIIb - Cr baseline 2-2.5, note hx contrast induced nephropathy.  -  Cr 1.94 today post cath, stable  - continue follow up with [facility].  OSA - CPAP QHS   DM type I - Stable on home insulin regimen, no  change   Chronic anemia  - Attributed to CKD. Patient is followed by heme/onc and receives Retacrit monthly. Hgb 10.4 near baseline       Did the patient have an acute coronary syndrome (MI, NSTEMI, STEMI, etc) this admission?:  No                               Did the patient have a percutaneous coronary intervention (stent / angioplasty)?:  Yes.     Cath/PCI Registry Performance & Quality Measures: Aspirin prescribed? - Yes ADP Receptor Inhibitor (Plavix/Clopidogrel, Brilinta/Ticagrelor or Effient/Prasugrel) prescribed (includes medically managed patients)? - Yes High Intensity Statin (Lipitor 40-'80mg'$  or Crestor 20-'40mg'$ ) prescribed? - No - statin intolerance  For EF <40%, was ACEI/ARB prescribed? - Not Applicable (EF >/= [id number]) For EF <40%, Aldosterone Antagonist (Spironolactone or Eplerenone) prescribed? - Not Applicable (EF >/= [id number]) Cardiac Rehab Phase II ordered? - Yes       The patient will be scheduled for a TOC follow up appointment in 14 days.  A message has been sent to the [facility] and Scheduling Pool at the office where the patient should be seen for follow up.  _____________  Discharge Vitals Blood pressure (!) 165/65, pulse 91, temperature 99.1 F (37.3 C), temperature source Oral,  resp. rate 18, height '5\' 4"'$  (1.626 m), weight 104.8 kg, SpO2 93 %.  Filed Weights   09/07/22 0957  Weight: 104.8 kg   Physical exam: see addendum note    Labs & Radiologic Studies    CBC Recent Labs    09/07/22 2010 09/08/22 0207  WBC 11.8* 18.0*  HGB 10.1* 10.4*  HCT 32.0* 32.8*  MCV 85.1 85.4  PLT 228 [id number]   Basic Metabolic Panel Recent Labs    09/07/22 1013 09/07/22 2010 09/08/22 0207  NA 136  --  139  K 3.9  --  4.3  CL 101  --  107  CO2 24  --  22  GLUCOSE 169*  --  172*  BUN 64*  --  57*  CREATININE 1.78* 1.69* 1.94*  CALCIUM 9.1  --  8.8*   Liver Function Tests Recent Labs    09/07/22 1013  AST 33  ALT 32  ALKPHOS 159*  BILITOT 0.7  PROT 7.4  ALBUMIN 3.3*   No results for input(s): "LIPASE", "AMYLASE" in the last 72 hours. High Sensitivity Troponin:   Recent Labs  Lab 09/07/22 1013 09/07/22 1208  TROPONINIHS 39* 40*    BNP Invalid input(s): "POCBNP" D-Dimer No results for input(s): "DDIMER" in the last 72 hours. Hemoglobin A1C No results for input(s): "HGBA1C" in the last 72 hours. Fasting Lipid Panel No results for input(s): "CHOL", "HDL", "LDLCALC", "TRIG", "CHOLHDL", "LDLDIRECT" in the last 72 hours. Thyroid Function Tests No results for input(s): "TSH", "T4TOTAL", "T3FREE", "THYROIDAB" in the last 72 hours.  Invalid input(s): "FREET3" _____________  CARDIAC CATHETERIZATION  Result Date: 09/07/2022   Mid LAD to Dist LAD lesion is 60% stenosed.   Prox RCA lesion is 60% stenosed.   1st Mrg-1 lesion is 30% stenosed.   Mid Cx-2 lesion is 100% stenosed.   1st Mrg-2 lesion is 60% stenosed.   Prox LAD to Mid LAD lesion is 90% stenosed.   A drug-eluting stent was successfully placed using a SYNERGY XD 2.75X28.   Post intervention, there is a 0% residual stenosis.   Mid LAD lesion is 40% stenosed.   Non-stenotic Mid Cx-1 lesion was  previously treated.   Non-stenotic Dist RCA lesion was previously treated.   Post intervention, there is a 0%  residual stenosis.   LV end diastolic pressure is mildly elevated. 2 vessel obstructive CAD. New 90% stenosis in the proximal LAD with RFR 0.48. The stent in the LCx is occluded after a large first OM Patent stent in the distal RCA Mildly elevated LVEDP 21 mm Hg Successful PCI of the proximal LAD with DES x 1 overlapping with prior stent. Plan: observe overnight. Anticipate DC in am. DAPT indefinitely with ASA/Plavix.  Will treat LCx occlusion medically.   DG Chest 2 View  Result Date: 09/07/2022 CLINICAL DATA:  Provided history: Chest pain. EXAM: CHEST - 2 VIEW COMPARISON:  Prior chest radiographs 08/07/2022. FINDINGS: Mild cardiomegaly. Redemonstrated MitraClip device. An additional device projects over the left superior aspect of the cardiomediastinal silhouette. Correlate with the procedural history. Central pulmonary vascular congestion without overt pulmonary edema. Ill-defined opacities within the left lung base. No appreciable airspace consolidation on the right. No evidence of pleural effusion or pneumothorax. Spondylosis and dextrocurvature of the thoracic spine. IMPRESSION: Ill-defined opacities within the left lung base, which may reflect atelectasis and/or pneumonia. Cardiomegaly with central pulmonary vascular congestion. No overt pulmonary edema. Electronically Signed   By: Kellie Simmering D.O.   On: 09/07/2022 10:37   Disposition   Patient is seen by Dr Johney Frame today and deemed stable for discharge. Discharge plan, follow up plan, medication change had been reviewed. Pt is being discharged home today in good condition.   Follow-up Plans & Appointments     Follow-up Information     Bensimhon, Shaune Pascal, MD Follow up.   Specialty: Cardiology Why: Our office staff will call you Monday arranging follow up appointment Contact information: [address] [place] [address] [phone]                Discharge Instructions     AMB Referral to Cardiac  Rehabilitation - Phase II   Complete by: As directed    Diagnosis:  Coronary Stents NSTEMI     After initial evaluation and assessments completed: Virtual Based Care may be provided alone or in conjunction with Phase 2 Cardiac Rehab based on patient barriers.: Yes   Intensive Cardiac Rehabilitation (ICR) [place] location only OR Traditional Cardiac Rehabilitation (TCR) *If criteria for ICR are not met will enroll in TCR [facility] only): Yes   Diet - low sodium heart healthy   Complete by: As directed    Discharge instructions   Complete by: As directed    PLEASE DO NOT MISS ANY DOSES OF YOUR ASPIRIN/PLAVIX !!!!!   Also keep a log of you blood pressures and bring back to your follow up appt. Please call the office with any questions.   Patients taking blood thinners should generally stay away from medicines like ibuprofen, Advil, Motrin, naproxen, and Aleve due to risk of stomach bleeding. You may take Tylenol as directed or talk to your primary doctor about alternatives.  PLEASE ENSURE THAT YOU DO NOT RUN OUT OF YOUR ASPIRIN/PLAVIX. This medication is very important to remain on. IF you have issues obtaining this medication due to cost please CALL the office 3-5 business days prior to running out in order to prevent missing doses of this medication.    PLEASE REMEMBER TO BRING ALL OF YOUR MEDICATIONS TO EACH OF YOUR FOLLOW-UP OFFICE VISITS.  PLEASE ATTEND ALL SCHEDULED FOLLOW-UP APPOINTMENTS.   Activity: Increase activity slowly as tolerated. You  may shower, but no soaking baths (or swimming) for 1 week. No driving for 24 hours. No lifting over 5 lbs for 1 week. No sexual activity for 1 week.   You May Return to Work: in 2 weeks   Wound Care: You may wash cath site gently with soap and water. Keep cath site clean and dry. If you notice pain, swelling, bleeding or pus at your cath site, please call [phone].   Groin Site Care  Refer to this sheet in the next few weeks. These instructions  provide you with information on caring for yourself after your procedure. Your caregiver may also give you more specific instructions. Your treatment has been planned according to current medical practices, but problems sometimes occur. Call your caregiver if you have any problems or questions after your procedure.  HOME CARE INSTRUCTIONS You may shower 24 hours after the procedure. Remove the bandage (dressing) and gently wash the site with plain soap and water. Gently pat the site dry.  Do not apply powder or lotion to the site.  Do not sit in a bathtub, swimming pool, or whirlpool for 5 to 7 days.  No bending, squatting, or lifting anything over 10 pounds (4.5 kg) as directed by your caregiver.  Inspect the site at least twice daily.  Do not drive home if you are discharged the same day of the procedure. Have someone else drive you.  You may drive 24 hours after the procedure unless otherwise instructed by your caregiver.   What to expect: Any bruising will usually fade within 1 to 2 weeks.  Blood that collects in the tissue (hematoma) may be painful to the touch. It should usually decrease in size and tenderness within 1 to 2 weeks.   SEEK IMMEDIATE MEDICAL CARE IF: You have unusual pain at the groin site or down the affected leg.  You have redness, warmth, swelling, or pain at the groin site.  You have drainage (other than a small amount of blood on the dressing).  You have chills.  You have a fever or persistent symptoms for more than 72 hours.  You have a fever and your symptoms suddenly get worse.  Your leg becomes pale, cool, tingly, or numb.  You have heavy bleeding from the site. Hold pressure on the site. .   Increase activity slowly   Complete by: As directed         Discharge Medications   Allergies as of 09/08/2022       Reactions   Naproxen Hives, Rash   Celecoxib Other (See Comments)   Stomach pain   Aspirin Other (See Comments)   CAN NOT TAKE DUE TO ULCERS  (documented previously) but takes every day currently (as of 09/29/20) without reported issues Can take coated aspirin   Glucosamine Hives, Swelling, Other (See Comments)   Angioedema   Iron    IV iron transfusion - hives - Feb 2022 hospitalization   Metoclopramide Other (See Comments)   Facial twitching and stuttering   Metolazone Other (See Comments)   Acute renal failure   Other Hives   Strawberry allergy   Shellfish-derived Products Hives, Swelling, Other (See Comments)   Angioedema   Statins Other (See Comments)   Muscle pain   Strawberry (diagnostic)    Strawberry Extract    Strawberry Flavor    Strawberry Leaves Extract    Tramadol Nausea And Vomiting           Medication List     TAKE  these medications    allopurinol 300 MG tablet Commonly known as: ZYLOPRIM Take 1 tablet (300 mg total) by mouth daily.   aspirin EC 81 MG tablet Take 1 tablet (81 mg total) by mouth daily. Swallow whole. Start taking on: September 09, 2022   BD Pen Needle Nano 2nd Gen 32G X 4 MM Misc Generic drug: Insulin Pen Needle Inject 1 each into the skin in the morning, at noon, in the evening, and at bedtime.   busPIRone 5 MG tablet Commonly known as: BUSPAR Take 1 tablet (5 mg total) by mouth 3 (three) times daily.   carvedilol 12.5 MG tablet Commonly known as: COREG TAKE ONE TABLET BY MOUTH TWICE DAILY What changed: when to take this   clopidogrel 75 MG tablet Commonly known as: PLAVIX Take 1 tablet (75 mg total) by mouth daily.   CRANBERRY PO Take 2 tablets by mouth daily.   EPINEPHrine 0.3 mg/0.3 mL Soaj injection Commonly known as: EPI-PEN Use as directed for life-threatening allergic reaction.   ezetimibe 10 MG tablet Commonly known as: ZETIA Take 1 tablet (10 mg total) by mouth daily.   FISH OIL PO Take 1 capsule by mouth daily.   FreeStyle Libre 2 Sensor Misc USE TO check blood glucose AS DIRECTED AND CHANGE sensor every 14 DAYS   hydrALAZINE 50 MG  tablet Commonly known as: APRESOLINE Take 1 tablet (50 mg total) by mouth 3 (three) times daily. What changed:  medication strength how much to take   HYDROcodone-acetaminophen 7.5-325 MG tablet Commonly known as: NORCO TAKE ONE TABLET BY MOUTH THREE TIMES DAILY AS NEEDED What changed:  when to take this reasons to take this   isosorbide mononitrate 30 MG 24 hr tablet Commonly known as: IMDUR Take 1 tablet (30 mg total) by mouth daily.   lactulose 10 GM/15ML solution Commonly known as: CHRONULAC Take 15 mLs (10 g total) by mouth daily as needed for mild constipation.   latanoprost 0.005 % ophthalmic solution Commonly known as: XALATAN Place 1 drop into both eyes at bedtime.   Levemir FlexPen 100 UNIT/ML FlexPen Generic drug: insulin detemir Inject 50 units into THE SKIN daily What changed: See the new instructions.   linaclotide 145 MCG Caps capsule Commonly known as: Linzess Take 1 capsule (145 mcg total) by mouth daily before breakfast.   LORazepam 0.5 MG tablet Commonly known as: ATIVAN TAKE ONE TABLET BY MOUTH ONCE daily AS NEEDED FOR ANXIETY What changed: See the new instructions.   magnesium oxide 400 MG tablet Commonly known as: MAG-OX Take 2 tablets (800 mg total) by mouth daily. Take 2 (400 mg) daily=800 mg What changed:  how much to take additional instructions   metolazone 2.5 MG tablet Commonly known as: ZAROXOLYN Take 1 tablet (2.5 mg total) by mouth as needed.   nitroGLYCERIN 0.4 MG SL tablet Commonly known as: NITROSTAT Dissolve 1 tab under tongue as needed for chest pain. May repeat every 5 minutes x 2 doses. If no relief call 9-1-1. Please call [phone] to schedule an appointment for future refills. Thank you. What changed:  how much to take how to take this when to take this reasons to take this additional instructions   NovoLOG FlexPen 100 UNIT/ML FlexPen Generic drug: insulin aspart 2-10 U before meals based on SSI. What changed:   how much to take how to take this when to take this additional instructions   OXYGEN Inhale 2 L into the lungs at bedtime.   pantoprazole 40 MG tablet  Commonly known as: PROTONIX Take 1 tablet (40 mg total) by mouth daily.   potassium chloride SA 20 MEQ tablet Commonly known as: KLOR-CON M TAKE ONE TABLET BY MOUTH ONCE daily   Probiotic Caps Take 1 capsule by mouth daily.   promethazine 25 MG tablet Commonly known as: PHENERGAN TAKE ONE TABLET BY MOUTH daily AS NEEDED What changed: reasons to take this   Repatha SureClick [id number] MG/ML Soaj Generic drug: Evolocumab Inject 1 Dose into the skin every 14 (fourteen) days.   torsemide 20 MG tablet Commonly known as: DEMADEX TAKE 4 TABLETS BY MOUTH ONCE DAILY What changed: See the new instructions.           Outstanding Labs/Studies    Duration of Discharge Encounter   Greater than 30 minutes including physician time.  Signed, Margie Billet, NP 09/08/2022, 10:30 AM  Patient seen and examined and agree with Margie Billet, NP as detailed above. Dyspnea improved after dose of lasix and ambulated hallway with O2 sats >92%. Will discharge home. Please see progress note from today for full details.   Gwyndolyn Kaufman, MD

## 2022-09-08 NOTE — Progress Notes (Signed)
Pt received on toilet. Assisted patient back to bed using front wheel walker. Pt agrees to education with daughter present. Pt voices she has stent card, encouraged to copy and carry. Reviewed cath site care, including S/S to report to MD, activity limitations , precautions and wound care. Stressed the use of Asa/Plavix. Reviewed heart healthy diet, low Na diet; provided handouts.Pt voices she weights daily at home. Pt voices she saw Dr. [name] recently and has already gotten a referral for the CRP2 program at [facility]. Pt and daughter verbalized understanding of the education provided and all questions answered. Pt is diabetic and starting her breakfast. Will return after she eats to ambulate her.  Lesly Rubenstein MS, ACSM-CEP, CCRP  8:05 [id number]

## 2022-09-08 NOTE — Progress Notes (Signed)
Rounding Note    Patient Name: Dana [name] Date of Encounter: 09/08/2022  [facility] Cardiologist: [name], MD   Subjective    Was planned for discharge today but became dyspneic with ambulation with PT with drop in O2 sat to 89%. Will dose lasix IV x1 dose given elevated LVEDP on cath and monitor today.  Inpatient Medications    Scheduled Meds:  allopurinol  300 mg Oral Daily   aspirin EC  81 mg Oral Daily   busPIRone  5 mg Oral TID   carvedilol  12.5 mg Oral BID   clopidogrel  75 mg Oral Daily   enoxaparin (LOVENOX) injection  40 mg Subcutaneous Q24H   ezetimibe  10 mg Oral Daily   furosemide  40 mg Intravenous Once   hydrALAZINE  50 mg Oral Q8H   insulin aspart  0-15 Units Subcutaneous TID WC   insulin aspart  0-5 Units Subcutaneous QHS   isosorbide mononitrate  30 mg Oral Daily   linaclotide  145 mcg Oral QAC breakfast   LORazepam  0.5 mg Oral QHS   magnesium oxide  800 mg Oral Daily   nitroGLYCERIN  1 inch Topical Q6H   pantoprazole  40 mg Oral Daily   potassium chloride SA  20 mEq Oral Daily   sodium chloride flush  3 mL Intravenous Q12H   torsemide  80 mg Oral Daily   Continuous Infusions:  sodium chloride     sodium chloride     PRN Meds: sodium chloride, acetaminophen, HYDROcodone-acetaminophen, nitroGLYCERIN, ondansetron (ZOFRAN) IV, sodium chloride flush   Vital Signs    Vitals:   09/07/22 1956 09/07/22 2339 09/08/22 0415 09/08/22 0731  BP: (!) 161/70 (!) 166/60 (!) 142/59 (!) 165/65  Pulse: 85 94 90 91  Resp: '18 20 18 18  '$ Temp: 97.7 F (36.5 C) 98.6 F (37 C) 98.2 F (36.8 C) 99.1 F (37.3 C)  TempSrc: Oral Oral Oral Oral  SpO2: 96% 92% 95% 93%  Weight:      Height:        Intake/Output Summary (Last 24 hours) at 09/08/2022 1048 Last data filed at 09/08/2022 1000 Gross per 24 hour  Intake 637.11 ml  Output 800 ml  Net -162.89 ml      09/07/2022    9:57 AM 08/31/2022    7:39 AM 08/30/2022   11:14 AM  Last 3  Weights  Weight (lbs) 231 lb 232 lb 233 lb 6.4 oz  Weight (kg) 104.781 kg 105.235 kg 105.87 kg      Telemetry    NSR - Personally Reviewed  ECG    NSR, subtle STD in lateral leads (present on prior), prolonged QTc - Personally Reviewed  Physical Exam   GEN: No acute distress.   Neck: No JVD Cardiac: RRR, 2/6 systolic murmur. No rubs, or gallops. Right groin access site ecchymotic but no hematoma palpated Respiratory: Clear to auscultation bilaterally. GI: Soft, nontender, non-distended  MS: No edema; No deformity. Neuro:  Nonfocal  Psych: Normal affect   Labs    High Sensitivity Troponin:   Recent Labs  Lab 09/07/22 1013 09/07/22 1208  TROPONINIHS 39* 40*     Chemistry Recent Labs  Lab 09/07/22 1013 09/07/22 2010 09/08/22 0207  NA 136  --  139  K 3.9  --  4.3  CL 101  --  107  CO2 24  --  22  GLUCOSE 169*  --  172*  BUN 64*  --  57*  CREATININE 1.78* 1.69* 1.94*  CALCIUM 9.1  --  8.8*  PROT 7.4  --   --   ALBUMIN 3.3*  --   --   AST 33  --   --   ALT 32  --   --   ALKPHOS 159*  --   --   BILITOT 0.7  --   --   GFRNONAA 32* 35* 29*  ANIONGAP 11  --  10    Lipids No results for input(s): "CHOL", "TRIG", "HDL", "LABVLDL", "LDLCALC", "CHOLHDL" in the last 168 hours.  Hematology Recent Labs  Lab 09/07/22 1013 09/07/22 2010 09/08/22 0207  WBC 11.2* 11.8* 18.0*  RBC 3.79* 3.76* 3.84*  HGB 10.1* 10.1* 10.4*  HCT 33.4* 32.0* 32.8*  MCV 88.1 85.1 85.4  MCH 26.6 26.9 27.1  MCHC 30.2 31.6 31.7  RDW 16.0* 15.8* 15.8*  PLT 248 228 251   Thyroid No results for input(s): "TSH", "FREET4" in the last 168 hours.  BNP Recent Labs  Lab 09/07/22 1013  BNP 168.7*    DDimer No results for input(s): "DDIMER" in the last 168 hours.   Radiology    CARDIAC CATHETERIZATION  Result Date: 09/07/2022   Mid LAD to Dist LAD lesion is 60% stenosed.   Prox RCA lesion is 60% stenosed.   1st Mrg-1 lesion is 30% stenosed.   Mid Cx-2 lesion is 100% stenosed.   1st Mrg-2  lesion is 60% stenosed.   Prox LAD to Mid LAD lesion is 90% stenosed.   A drug-eluting stent was successfully placed using a SYNERGY XD 2.75X28.   Post intervention, there is a 0% residual stenosis.   Mid LAD lesion is 40% stenosed.   Non-stenotic Mid Cx-1 lesion was previously treated.   Non-stenotic Dist RCA lesion was previously treated.   Post intervention, there is a 0% residual stenosis.   LV end diastolic pressure is mildly elevated. 2 vessel obstructive CAD. New 90% stenosis in the proximal LAD with RFR 0.48. The stent in the LCx is occluded after a large first OM Patent stent in the distal RCA Mildly elevated LVEDP 21 mm Hg Successful PCI of the proximal LAD with DES x 1 overlapping with prior stent. Plan: observe overnight. Anticipate DC in am. DAPT indefinitely with ASA/Plavix.  Will treat LCx occlusion medically.   DG Chest 2 View  Result Date: 09/07/2022 CLINICAL DATA:  Provided history: Chest pain. EXAM: CHEST - 2 VIEW COMPARISON:  Prior chest radiographs 08/07/2022. FINDINGS: Mild cardiomegaly. Redemonstrated MitraClip device. An additional device projects over the left superior aspect of the cardiomediastinal silhouette. Correlate with the procedural history. Central pulmonary vascular congestion without overt pulmonary edema. Ill-defined opacities within the left lung base. No appreciable airspace consolidation on the right. No evidence of pleural effusion or pneumothorax. Spondylosis and dextrocurvature of the thoracic spine. IMPRESSION: Ill-defined opacities within the left lung base, which may reflect atelectasis and/or pneumonia. Cardiomegaly with central pulmonary vascular congestion. No overt pulmonary edema. Electronically Signed   By: Kellie Simmering D.O.   On: 09/07/2022 10:37    Cardiac Studies   LHC on 09/07/22:   Fluoro time: 9.8 (min) DAP: 27095 (mGycm2) Cumulative Air Kerma: 435 (mGy)   Diagnostic Dominance: Right  Intervention        Mid LAD to Dist LAD lesion is 60%  stenosed.   Prox RCA lesion is 60% stenosed.   1st Mrg-1 lesion is 30% stenosed.   Mid Cx-2 lesion is 100% stenosed.   1st Mrg-2 lesion is  60% stenosed.   Prox LAD to Mid LAD lesion is 90% stenosed.   A drug-eluting stent was successfully placed using a SYNERGY XD 2.75X28.   Post intervention, there is a 0% residual stenosis.   Mid LAD lesion is 40% stenosed.   Non-stenotic Mid Cx-1 lesion was previously treated.   Non-stenotic Dist RCA lesion was previously treated.   Post intervention, there is a 0% residual stenosis.   LV end diastolic pressure is mildly elevated.   2 vessel obstructive CAD. New 90% stenosis in the proximal LAD with RFR 0.48. The stent in the LCx is occluded after a large first OM Patent stent in the distal RCA Mildly elevated LVEDP 21 mm Hg Successful PCI of the proximal LAD with DES x 1 overlapping with prior stent.    Plan: observe overnight. Anticipate DC in am. DAPT indefinitely with ASA/Plavix.  Will treat LCx occlusion medically.   Patient Profile     62 y.o. female  hx diastolic heart failure s/p cardiomems 02/2018, CAD s/p DES to RCA, LAD, Cx, DM I, hypertension, hyperlipidemia, PAD s/p left great toe and 4th toe amputation, OSA on CPAP, PAF s/p ablation, CKD stage IIIb who presented with chest pain found to have 90% proximal LAD stenosis s/p PCI.   Assessment & Plan    Unstable angina CAD with multiple stents  - presented with angina at rest, similar to previous MI - Hs Troponin 39->40.  - ECG with non-specific T wave changes - A1C 7.4% and LDL 15 from outpatient labs on 08/31/22  - LHC on 09/07/22 showed 2 vessel obstructive CAD. New 90% stenosis in the proximal LAD with RFR 0.48. The stent in the LCx is occluded after a large first OM; Patent stent in the distal RCA; Mildly elevated LVEDP 21 mm Hg; Successful PCI of the proximal LAD with DES x 1 overlapping with prior stent. Lcx occlusion to be treated medically.  - Recommend medical therapy including  ASA '81mg'$  daily + Plavix '75mg'$  daily (DAPT indefinitely, ASA is new this admission, hx of ulcer, already on Protonix), continue PTA meds Coreg 12.'5mg'$  BID,Imdur '30mg'$ , Repatha, and Zetia '10mg'$  daily  - Follow up with AHF clinic in 2 weeks, message sent to office staff for arranging this    Chronic diastolic CHF - Echo from [id number] with LVEF 55-60%, normal RV, stable moderate MR with mean gradient 7.62mHg.  - Note Mildly elevated LVEDP 21 mm Hg on LHC, more SOB today with ambulation, IV Lasix '40mg'$  x1 given, if improving, may DC later today vs tomorrow - continue Torsemide 80 mg daily and metolazone as needed - GDMT: Hydralazine '50mg'$  TID, Coreg 12.'5mg'$  BID, Imdur '30mg'$  QD; Not SGLT2 candidate, type I DM.  - Follow up with AHF clinic in 2 weeks, message sent to office staff for arranging this    HTN - BP has been elevated up to 165/65, hydralazine increased from '25mg'$  to '50mg'$  TID this admission, continue PTA coreg and imdur    Mitral regurgitation - Severe central MR on TEE on 10/22.   - S/p TEER 05/18/2021 - two clips placed, one became entangled and had to be deployed. - Moderate residual MR on echo 11/22. - Recent Echo 07/2022 with stable moderate MR, mG 7.5 mmHg  - Structural team following. No indication for re-clip   CKD stage IIIb - Cr baseline 2-2.5, note hx contrast induced nephropathy.  -  Cr 1.94 today post cath, stable  - continue follow up with [facility].  OSA - CPAP QHS   DM type I - Stable on home insulin regimen, no change    Chronic anemia  - Attributed to CKD. Patient is followed by heme/onc and receives Retacrit monthly. Hgb 10.4 near baseline   Will give additional dose of lasix IV now given SOB with ambulation with mild drop in O2 sats. If feels better later today, can potentially D/C home. Will follow-up.    For questions or updates, please contact [facility] Please consult [email or web address] for contact info under        Signed, Freada Bergeron, MD  09/08/2022, 10:48 AM

## 2022-09-08 NOTE — Progress Notes (Signed)
RE:                Rate/Rhythm: NSR/86   BP:                  Sitting: 141/53                                                  SaO2: 94   MODE:            Ambulation: 70 ft            POST:             Rate/Rhythm: NSR/89 SaO2 (89%)   BP:                  Sitting:141/56                                                  SaO2: 92     Pt ambulated with front wheel walker and gait belt. Pt ambulated 35 ft and needed to sit due to feeling weak. Pt denies dizziness or lightheadedness. Pt was able to ambulate back to room with c/o SOB. SaO2 89%. Pt recovered to 92% on RA sitting at bedside. Primary RN notified of above.    Lesly Rubenstein, MS, ACSM EP-C, [facility] 09/08/2022  9:37 - 10:01

## 2022-09-08 NOTE — Progress Notes (Signed)
Mobility Specialist Progress Note:   09/08/22 1545  Mobility  Activity Ambulated with assistance in hallway  Level of Assistance Contact guard assist, steadying assist  Assistive Device Front wheel walker  Distance Ambulated (ft) 150 ft  Activity Response Tolerated well  Mobility Referral Yes  $Mobility charge 1 Mobility   Pt agreeable to mobility session. Reports baseline is ambulating short household distances and using w/c when out of the house. Ambulated with only minG assist with RW, SpO2 94% on RA. Pt displayed minor SOB, however able to recover with short standing rest breaks. Claims to be at baseline, family agrees.   Nelta Numbers Mobility Specialist Please contact via SecureChat or  Rehab office at [phone]

## 2022-09-10 ENCOUNTER — Telehealth: Payer: Self-pay

## 2022-09-10 ENCOUNTER — Inpatient Hospital Stay: Payer: PPO

## 2022-09-10 ENCOUNTER — Encounter (HOSPITAL_COMMUNITY): Payer: Self-pay | Admitting: Cardiology

## 2022-09-10 DIAGNOSIS — Z7902 Long term (current) use of antithrombotics/antiplatelets: Secondary | ICD-10-CM | POA: Diagnosis not present

## 2022-09-10 DIAGNOSIS — Z955 Presence of coronary angioplasty implant and graft: Secondary | ICD-10-CM | POA: Diagnosis not present

## 2022-09-10 DIAGNOSIS — I25118 Atherosclerotic heart disease of native coronary artery with other forms of angina pectoris: Secondary | ICD-10-CM | POA: Diagnosis not present

## 2022-09-10 DIAGNOSIS — Z7982 Long term (current) use of aspirin: Secondary | ICD-10-CM | POA: Diagnosis not present

## 2022-09-10 NOTE — Transitions of Care (Post Inpatient/ED Visit) (Signed)
   09/10/2022  Name: Dana [name] MRN: [id number] DOB: January 23, 1961  Today's TOC FU Call Status: Today's TOC FU Call Status:: Successful TOC FU Call Competed TOC FU Call Complete Date: 09/10/22  Transition Care Management Follow-up Telephone Call Date of Discharge: 09/08/22 Discharge Facility: [facility]) Type of Discharge: Inpatient Admission Primary Inpatient Discharge Diagnosis:: chest pain How have you been since you were released from the hospital?: Better Any questions or concerns?: No  Items Reviewed: Did you receive and understand the discharge instructions provided?: Yes Medications obtained and verified?: Yes (Medications Reviewed) Any new allergies since your discharge?: No Dietary orders reviewed?: Yes Do you have support at home?: Yes People in Home: spouse  Home Care and Equipment/Supplies: [facility] Ordered?: NA Any new equipment or medical supplies ordered?: NA  Functional Questionnaire: Do you need assistance with bathing/showering or dressing?: No Do you need assistance with meal preparation?: No Do you need assistance with eating?: No Do you have difficulty maintaining continence: No Do you need assistance with getting out of bed/getting out of a chair/moving?: No Do you have difficulty managing or taking your medications?: No  Folllow up appointments reviewed: PCP Follow-up appointment confirmed?: Yes Date of PCP follow-up appointment?: 09/17/22 Follow-up Provider: [facility] Follow-up appointment confirmed?: No Reason Specialist Follow-Up Not Confirmed: Patient has Specialist Provider Number and will Call for Appointment Do you need transportation to your follow-up appointment?: No Do you understand care options if your condition(s) worsen?: Yes-patient verbalized understanding    [place], [place] Nurse Health Advisor Direct Dial [phone]

## 2022-09-17 ENCOUNTER — Ambulatory Visit (INDEPENDENT_AMBULATORY_CARE_PROVIDER_SITE_OTHER): Payer: PPO | Admitting: Family Medicine

## 2022-09-17 ENCOUNTER — Encounter: Payer: Self-pay | Admitting: Family Medicine

## 2022-09-17 VITALS — BP 134/62 | HR 73 | Temp 97.1°F | Ht 64.5 in | Wt 234.0 lb

## 2022-09-17 DIAGNOSIS — I2510 Atherosclerotic heart disease of native coronary artery without angina pectoris: Secondary | ICD-10-CM | POA: Diagnosis not present

## 2022-09-17 DIAGNOSIS — E1122 Type 2 diabetes mellitus with diabetic chronic kidney disease: Secondary | ICD-10-CM | POA: Diagnosis not present

## 2022-09-17 DIAGNOSIS — E1121 Type 2 diabetes mellitus with diabetic nephropathy: Secondary | ICD-10-CM

## 2022-09-17 DIAGNOSIS — N184 Chronic kidney disease, stage 4 (severe): Secondary | ICD-10-CM | POA: Diagnosis not present

## 2022-09-17 DIAGNOSIS — R82998 Other abnormal findings in urine: Secondary | ICD-10-CM | POA: Diagnosis not present

## 2022-09-17 DIAGNOSIS — D638 Anemia in other chronic diseases classified elsewhere: Secondary | ICD-10-CM

## 2022-09-17 NOTE — Assessment & Plan Note (Addendum)
Resolved with stent Continue plavix and aspirin indefinitely.  Follow up with Dr. Kennon Rounds on Friday.

## 2022-09-17 NOTE — Assessment & Plan Note (Signed)
>>  ASSESSMENT AND PLAN FOR CAD (CORONARY ARTERY DISEASE) WRITTEN ON 09/30/2022  8:59 PM BY COX, KIRSTEN, MD  Resolved with stent Continue plavix and aspirin indefinitely.  Follow up with Dr. Justice Deeds on Friday.

## 2022-09-17 NOTE — Progress Notes (Signed)
Subjective:  Patient ID: Dana [name], female    DOB: [birth year]  Age: 62 y.o. MRN: [id number]  Chief Complaint  Patient presents with   Hospitalization Follow-up    HPI Follow up Hospitalization Patient was admitted to [facility] on 09/07/2022 and discharged on 09/08/2022. She was treated for Chest Pain. Found to have a 90%+ blockage of LAD. Treatment for this included placement of Synergy XD Stent. Telephone follow up was done on 09/10/2022 She reports excellent compliance with treatment. She reports this condition is resolved. Dr. [name] follow up this coming Friday.       09/25/2022   10:55 AM 08/31/2022    7:40 AM 08/21/2022    9:46 AM 08/01/2022   10:34 AM 04/25/2022    7:58 AM  Depression screen PHQ 2/9  Decreased Interest 1 1 1 1  0  Down, Depressed, Hopeless 1 1 1 1 1   PHQ - 2 Score 2 2 2 2 1   Altered sleeping 1 2 1 1 3   Tired, decreased energy 1 3 2 3 3   Change in appetite 1 1 0 0 0  Feeling bad or failure about yourself  0 0 1 1 0  Trouble concentrating 0 0 1 1 0  Moving slowly or fidgety/restless 1 1 1 1  0  Suicidal thoughts 0 0 0 0 0  PHQ-9 Score 6 9 8 9 7   Difficult doing work/chores Somewhat difficult  Somewhat difficult Somewhat difficult Somewhat difficult         06/28/2022   11:39 AM 07/12/2022   11:38 AM 07/19/2022   10:43 AM 07/26/2022   10:27 AM 09/17/2022   11:26 AM  Fall Risk  Falls in the past year?     0  Was there an injury with Fall?     0  Fall Risk Category Calculator     0  (RETIRED) Patient Fall Risk Level High fall risk High fall risk High fall risk High fall risk   Patient at Risk for Falls Due to     No Fall Risks  Fall risk Follow up     Falls evaluation completed      Review of Systems  Constitutional:  Negative for chills, fatigue and fever.  HENT:  Negative for congestion, ear pain, rhinorrhea and sore throat.   Respiratory:  Negative for cough and shortness of breath.   Cardiovascular:  Negative for chest pain.   Gastrointestinal:  Negative for abdominal pain, constipation, diarrhea, nausea and vomiting.  Genitourinary:  Negative for dysuria and urgency.       Urine is very dark and has a odor. Was cath'd while in the hospital.  Musculoskeletal:  Negative for back pain and myalgias.  Psychiatric/Behavioral:  Negative for dysphoric mood. The patient is not nervous/anxious.     Current Outpatient Medications on File Prior to Visit  Medication Sig Dispense Refill   allopurinol (ZYLOPRIM) 300 MG tablet Take 1 tablet (300 mg total) by mouth daily. 90 tablet 1   aspirin EC 81 MG tablet Take 1 tablet (81 mg total) by mouth daily. Swallow whole. 30 tablet 12   busPIRone (BUSPAR) 5 MG tablet Take 1 tablet (5 mg total) by mouth 3 (three) times daily. 270 tablet 1   carvedilol (COREG) 12.5 MG tablet TAKE ONE TABLET BY MOUTH TWICE DAILY (Patient taking differently: Take 12.5 mg by mouth 2 (two) times daily with a meal.) 180 tablet 3   clopidogrel (PLAVIX) 75 MG tablet Take 1 tablet (75 mg total) by  mouth daily. 90 tablet 1   Continuous Blood Gluc Sensor (FREESTYLE LIBRE 2 SENSOR) MISC USE TO check blood glucose AS DIRECTED AND CHANGE sensor every 14 DAYS     CRANBERRY PO Take 2 tablets by mouth daily.     EPINEPHrine 0.3 mg/0.3 mL IJ SOAJ injection Use as directed for life-threatening allergic reaction. 4 Device 3   Evolocumab (REPATHA SURECLICK) 195 MG/ML SOAJ Inject 1 Dose into the skin every 14 (fourteen) days. 6 mL 1   ezetimibe (ZETIA) 10 MG tablet Take 1 tablet (10 mg total) by mouth daily. 90 tablet 3   insulin aspart (NOVOLOG FLEXPEN) 100 UNIT/ML FlexPen 2-10 U before meals based on SSI. (Patient taking differently: Inject 2-10 Units into the skin 3 (three) times daily with meals. Per sliding scale) 15 mL 3   Insulin Pen Needle (BD PEN NEEDLE NANO 2ND GEN) 32G X 4 MM MISC Inject 1 each into the skin in the morning, at noon, in the evening, and at bedtime. 600 each 3   isosorbide mononitrate (IMDUR) 30 MG  24 hr tablet Take 1 tablet (30 mg total) by mouth daily. 90 tablet 1   latanoprost (XALATAN) 0.005 % ophthalmic solution Place 1 drop into both eyes at bedtime.     LEVEMIR FLEXPEN 100 UNIT/ML FlexPen Inject 50 units into THE SKIN daily (Patient taking differently: Inject 50 Units into the skin daily.) 15 mL 1   linaclotide (LINZESS) 145 MCG CAPS capsule Take 1 capsule (145 mcg total) by mouth daily before breakfast. 90 capsule 1   LORazepam (ATIVAN) 0.5 MG tablet TAKE ONE TABLET BY MOUTH ONCE daily AS NEEDED FOR ANXIETY 30 tablet 2   magnesium oxide (MAG-OX) 400 MG tablet Take 2 tablets (800 mg total) by mouth daily. Take 2 (400 mg) daily=800 mg 60 tablet 1   metolazone (ZAROXOLYN) 2.5 MG tablet Take 1 tablet (2.5 mg total) by mouth as needed. 3 tablet 0   nitroGLYCERIN (NITROSTAT) 0.4 MG SL tablet Dissolve 1 tab under tongue as needed for chest pain. May repeat every 5 minutes x 2 doses. If no relief call 9-1-1. Please call [phone] to schedule an appointment for future refills. Thank you. 50 tablet 0   Omega-3 Fatty Acids (FISH OIL PO) Take 1 capsule by mouth daily.     OXYGEN Inhale 2 L into the lungs at bedtime.     pantoprazole (PROTONIX) 40 MG tablet Take 1 tablet (40 mg total) by mouth daily. 90 tablet 1   potassium chloride SA (KLOR-CON M) 20 MEQ tablet TAKE ONE TABLET BY MOUTH ONCE daily 90 tablet 1   Probiotic CAPS Take 1 capsule by mouth daily.     promethazine (PHENERGAN) 25 MG tablet TAKE ONE TABLET BY MOUTH daily AS NEEDED 30 tablet 3   torsemide (DEMADEX) 20 MG tablet TAKE 4 TABLETS BY MOUTH ONCE DAILY 336 tablet 3   No current facility-administered medications on file prior to visit.   Past Medical History:  Diagnosis Date   Acquired absence of left great toe ([place])    Acquired absence of other left toe(s) ([place])    ACS (acute coronary syndrome) ([place])    Anemia    Anoxic brain injury ([place])    Anxiety    CAD (coronary artery disease)    DES mLAD 04/07/15; DES dRCA & DES  pPDA 08/30/16; DES mCX & PTCA OM1 09/29/20   Chronic diastolic (congestive) heart failure (HCC)    Chronic pain    Chronic systolic (congestive) heart  failure (HCC)    CKD (chronic kidney disease)    Contrast dye induced nephropathy, possible 09/03/2020   COPD (chronic obstructive pulmonary disease) (HCC)    Diabetes (HCC)    Diastolic CHF ([place])    Dyspnea    Family history of breast cancer 10/23/2021   Gastro-esophageal reflux disease without esophagitis    Hyperlipidemia    Hypertension    Hypertensive heart disease with heart failure (HCC)    Hypoxemia    Idiopathic gout, multiple sites    Leukocytosis 03/29/2022   Localized edema    Low back pain    Mitral regurgitation    Mitral regurgitation    Mixed hyperlipidemia    Mixed incontinence    Morbid (severe) obesity due to excess calories (HCC)    Neuromuscular disorder (HCC)    Neuropathy    Non-ST elevation (NSTEMI) myocardial infarction ([place])    08/29/20, 09/29/20   OSA (obstructive sleep apnea) 09/03/2020   Other specified hypothyroidism    PAF (paroxysmal atrial fibrillation) ([place])    s/p pulmonary vein isolation by cryoablation 10/04/15 Dr. Minna Merritts in [facility]   PEA (Pulseless electrical activity) [facility])    ~ 01/13/21 at [facility] during admission DKA, volume overload, possible CAP, hypoxia s/p ACLS with ROSC ~ 10-15   Pneumonia    Primary insomnia    Rheumatoid arthritis ([place])    Stroke ([place])    Thyroid disease    Type 2 diabetes mellitus with diabetic nephropathy ([place])    Vitamin D deficiency    Past Surgical History:  Procedure Laterality Date   ABDOMINAL HYSTERECTOMY     Partial- Still has both ovaries   CARDIOVASCULAR STRESS TEST  08/13/2014   Nuclear; Normal   CARPAL TUNNEL RELEASE     CATARACT EXTRACTION, BILATERAL     CESAREAN SECTION     CHOLECYSTECTOMY     CORONARY ANGIOPLASTY WITH STENT PLACEMENT     3 blockages/ 1 stent   CORONARY STENT INTERVENTION N/A 09/29/2020   Procedure: CORONARY STENT  INTERVENTION;  Surgeon: Sherren Mocha, MD;  Location: [place] CV [facility];  Service: Cardiovascular;  Laterality: N/A;  CFX   CORONARY STENT INTERVENTION N/A 09/07/2022   Procedure: CORONARY STENT INTERVENTION;  Surgeon: Martinique, Peter M, MD;  Location: [place] CV LAB;  Service: Cardiovascular;  Laterality: N/A;   EYE SURGERY     INTRAVASCULAR PRESSURE WIRE/FFR STUDY N/A 09/07/2022   Procedure: INTRAVASCULAR PRESSURE WIRE/FFR STUDY;  Surgeon: Martinique, Peter M, MD;  Location: [place] [facility] LAB;  Service: Cardiovascular;  Laterality: N/A;   LEFT HEART CATH AND CORONARY ANGIOGRAPHY N/A 09/29/2020   Procedure: LEFT HEART CATH AND CORONARY ANGIOGRAPHY;  Surgeon: Sherren Mocha, MD;  Location: [place] [facility];  Service: Cardiovascular;  Laterality: N/A;   LEFT HEART CATH AND CORONARY ANGIOGRAPHY N/A 09/07/2022   Procedure: LEFT HEART CATH AND CORONARY ANGIOGRAPHY;  Surgeon: Martinique, Peter M, MD;  Location: [place] CV LAB;  Service: Cardiovascular;  Laterality: N/A;   MITRAL VALVE REPAIR N/A 05/18/2021   Procedure: MITRAL VALVE REPAIR;  Surgeon: Early Osmond, MD;  Location: [place] [facility] LAB;  Service: Cardiovascular;  Laterality: N/A;   RIGHT HEART CATH N/A 04/27/2021   Procedure: RIGHT HEART CATH;  Surgeon: Jolaine Artist, MD;  Location: [place] [facility];  Service: Cardiovascular;  Laterality: N/A;   RIGHT/LEFT HEART CATH AND CORONARY ANGIOGRAPHY N/A 01/07/2017   Procedure: Right/Left Heart Cath and Coronary Angiography;  Surgeon: Larey Dresser, MD;  Location:  [place] INVASIVE CV LAB;  Service: Cardiovascular;  Laterality: N/A;   ROTATOR CUFF REPAIR     TEE WITHOUT CARDIOVERSION N/A 04/28/2021   Procedure: TRANSESOPHAGEAL ECHOCARDIOGRAM (TEE);  Surgeon: Jolaine Artist, MD;  Location: [facility] ENDOSCOPY;  Service: Cardiovascular;  Laterality: N/A;   TEE WITHOUT CARDIOVERSION N/A 05/18/2021   Procedure: TRANSESOPHAGEAL ECHOCARDIOGRAM (TEE);  Surgeon: Early Osmond, MD;  Location: [place] [facility] LAB;  Service: Cardiovascular;  Laterality: N/A;   TOE AMPUTATION Left    US ECHOCARDIOGRAPHY  05/2017   Normal    Family History  Problem Relation Age of Onset   Breast cancer Mother 14       contralateral breast ca dx 59   Coronary artery disease Mother    Hypertension Mother    Hyperlipidemia Mother    Diabetes type II Mother    Lung cancer Mother 50   Diabetes Father    Hypertension Father    Mental illness Sister    Bipolar disorder Other    Depression Other    Schizophrenia Other    Cancer Other        MGF's sisters x2; unknown type   Social History   Socioeconomic History   Marital status: Married    Spouse name: [name]   Number of children: 2   Years of education: 58   Highest education level: Master's degree (e.g., MA, MS, MEng, MEd, MSW, MBA)  Occupational History   Occupation: on disability/retired early former Education officer, museum  Tobacco Use   Smoking status: Former    Types: Cigarettes    Start date: 1983    Quit date: 1984    Years since quitting: 40.2    Passive exposure: Past   Smokeless tobacco: Never  Vaping Use   Vaping Use: Never used  Substance and Sexual Activity   Alcohol use: Never   Drug use: Never   Sexual activity: Not on file  Other Topics Concern   Not on file  Social History Narrative   Not on file   Social Determinants of Health   Financial Resource Strain: Medium Risk (08/21/2022)   Overall Financial Resource Strain (CARDIA)    Difficulty of Paying Living Expenses: Somewhat hard  Food Insecurity: Food Insecurity Present (07/26/2022)   Hunger Vital Sign    Worried About [place] in the Last Year: Sometimes true    Ran Out of Food in the Last Year: Sometimes true  Transportation Needs: No Transportation Needs (07/25/2022)   PRAPARE - Hydrologist (Medical): No    Lack of Transportation (Non-Medical): No  Physical Activity: Insufficiently Active (09/25/2022)   Exercise Vital Sign     Days of Exercise per Week: 2 days    Minutes of Exercise per Session: 10 min  Stress: Stress Concern Present (09/25/2022)   [place]    Feeling of Stress : To some extent  Social Connections: Moderately Integrated (07/25/2022)   Social Connection and Isolation Panel [NHANES]    Frequency of Communication with Friends and Family: More than three times a week    Frequency of Social Gatherings with Friends and Family: More than three times a week    Attends Religious Services: More than 4 times per year    Active Member of [employer] or Organizations: No    Attends Archivist Meetings: Never    Marital Status: Married    Objective:  BP 134/62   Pulse  73   Temp (!) 97.1 F (36.2 C)   Ht 5' 4.5" (1.638 m)   Wt 234 lb (106.1 kg)   SpO2 99%   BMI 39.55 kg/m      09/24/2022   11:30 AM 09/21/2022   12:11 PM 09/17/2022   11:21 AM  BP/Weight  Systolic BP 825 92 053  Diastolic BP 46 64 62  Wt. (Lbs) 234 234 234  BMI 39.55 kg/m2 39.55 kg/m2 39.55 kg/m2    Physical Exam Vitals reviewed.  Constitutional:      Appearance: Normal appearance. She is obese.  Neck:     Vascular: No carotid bruit.  Cardiovascular:     Rate and Rhythm: Normal rate and regular rhythm.     Heart sounds: Normal heart sounds.  Pulmonary:     Effort: Pulmonary effort is normal. No respiratory distress.     Breath sounds: Normal breath sounds.  Abdominal:     General: Abdomen is flat. Bowel sounds are normal.     Palpations: Abdomen is soft.     Tenderness: There is no abdominal tenderness.  Neurological:     Mental Status: She is alert and oriented to person, place, and time.  Psychiatric:        Mood and Affect: Mood normal.        Behavior: Behavior normal.     Diabetic Foot Exam - Simple   No data filed      Lab Results  Component Value Date   WBC 11.8 (H) 09/17/2022   HGB 9.9 (L) 09/17/2022   HCT 32.2 (L) 09/17/2022    PLT 319 09/17/2022   GLUCOSE 132 (H) 09/17/2022   CHOL 134 08/31/2022   TRIG 109 08/31/2022   HDL 48 08/31/2022   LDLCALC 66 08/31/2022   ALT 14 09/17/2022   AST 13 09/17/2022   NA 142 09/17/2022   K 4.2 09/17/2022   CL 100 09/17/2022   CREATININE 1.89 (H) 09/17/2022   BUN 50 (H) 09/17/2022   CO2 23 09/17/2022   TSH 2.900 01/19/2022   INR 1.1 05/16/2021   HGBA1C 7.4 (H) 08/31/2022      Assessment & Plan:    Coronary artery disease involving native coronary artery of native heart without angina pectoris Assessment & Plan: Resolved with stent Continue plavix and aspirin indefinitely.  Follow up with Dr. Kennon Rounds on Friday.   Orders: -     Comprehensive metabolic panel -     CBC with Differential/Platelet  Dark urine Assessment & Plan: Checked UA  Orders: -     POCT URINALYSIS DIP (CLINITEK)  Diabetic nephropathy associated with type 2 diabetes mellitus ([place]) Assessment & Plan: The current medical regimen is effective;  continue present plan and medications. Management per specialist.     Chronic kidney disease, stage 4 (severe) ([place]) Assessment & Plan: Follow up with nephrology  Orders: -     Comprehensive metabolic panel  Anemia of chronic disease Assessment & Plan: Check cbc.      No orders of the defined types were placed in this encounter.   Orders Placed This Encounter  Procedures   Comprehensive metabolic panel   CBC with Differential/Platelet   POCT URINALYSIS DIP (CLINITEK)     Follow-up: No follow-ups on file.   I,Katherina A Bramblett,acting as a scribe for [name], MD.,have documented all relevant documentation on the behalf of [name], MD,as directed by  [name], MD while in the presence of [name], MD.   Geralynn Ochs I  Leal-Borjas,acting as a scribe for [name], MD.,have documented all relevant documentation on the behalf of [name], MD,as directed by  [name], MD while in the presence of [name], MD.      An After Visit Summary was printed and given to the patient.  [name], MD [facility] [phone]

## 2022-09-18 DIAGNOSIS — F411 Generalized anxiety disorder: Secondary | ICD-10-CM | POA: Diagnosis not present

## 2022-09-18 LAB — CBC WITH DIFFERENTIAL/PLATELET
Basophils Absolute: 0.1 10*3/uL (ref 0.0–0.2)
Basos: 1 %
EOS (ABSOLUTE): 0.3 10*3/uL (ref 0.0–0.4)
Eos: 2 %
Hematocrit: 32.2 % — ABNORMAL LOW (ref 34.0–46.6)
Hemoglobin: 9.9 g/dL — ABNORMAL LOW (ref 11.1–15.9)
Immature Grans (Abs): 0 10*3/uL (ref 0.0–0.1)
Immature Granulocytes: 0 %
Lymphocytes Absolute: 1.9 10*3/uL (ref 0.7–3.1)
Lymphs: 16 %
MCH: 26.6 pg (ref 26.6–33.0)
MCHC: 30.7 g/dL — ABNORMAL LOW (ref 31.5–35.7)
MCV: 87 fL (ref 79–97)
Monocytes Absolute: 0.7 10*3/uL (ref 0.1–0.9)
Monocytes: 6 %
Neutrophils Absolute: 8.8 10*3/uL — ABNORMAL HIGH (ref 1.4–7.0)
Neutrophils: 75 %
Platelets: 319 10*3/uL (ref 150–450)
RBC: 3.72 x10E6/uL — ABNORMAL LOW (ref 3.77–5.28)
RDW: 15.5 % — ABNORMAL HIGH (ref 11.7–15.4)
WBC: 11.8 10*3/uL — ABNORMAL HIGH (ref 3.4–10.8)

## 2022-09-18 LAB — COMPREHENSIVE METABOLIC PANEL
ALT: 14 IU/L (ref 0–32)
AST: 13 IU/L (ref 0–40)
Albumin/Globulin Ratio: 1.1 — ABNORMAL LOW (ref 1.2–2.2)
Albumin: 4.1 g/dL (ref 3.8–4.9)
Alkaline Phosphatase: 188 IU/L — ABNORMAL HIGH (ref 44–121)
BUN/Creatinine Ratio: 26 — ABNORMAL HIGH (ref 9–23)
BUN: 50 mg/dL — ABNORMAL HIGH (ref 6–24)
Bilirubin Total: 0.3 mg/dL (ref 0.0–1.2)
CO2: 23 mmol/L (ref 20–29)
Calcium: 9.4 mg/dL (ref 8.7–10.2)
Chloride: 100 mmol/L (ref 96–106)
Creatinine, Ser: 1.89 mg/dL — ABNORMAL HIGH (ref 0.57–1.00)
Globulin, Total: 3.6 g/dL (ref 1.5–4.5)
Glucose: 132 mg/dL — ABNORMAL HIGH (ref 70–99)
Potassium: 4.2 mmol/L (ref 3.5–5.2)
Sodium: 142 mmol/L (ref 134–144)
Total Protein: 7.7 g/dL (ref 6.0–8.5)
eGFR: 30 mL/min/{1.73_m2} — ABNORMAL LOW (ref >59)

## 2022-09-18 NOTE — Progress Notes (Signed)
Blood count abnormal. Improving Liver function normal.  Kidney function abnormal. stable

## 2022-09-20 ENCOUNTER — Ambulatory Visit: Payer: PPO

## 2022-09-20 NOTE — Progress Notes (Signed)
Advanced Heart Failure Clinic Note   PCP: CoxElnita Maxwell, MD HF Cardiologist: [name], MD   Reason for Visit: f/u chronic diastolic HF  HPI: Dana [name] is 62 y.o. female with history of diastolic heart failure s/p cardiomems 02/2018, CAD, s/p DES RCA and LAD 2018, DMI, htn, hyperlipidemia, PAD, and OSA on CPAP, PAF s/p ablation, PAD s/p L great toe and 4th toe amputation, stage IIIb CKD.   Admitted 03/22 with NSTEMI and a/c CHF. LHC 03/22 showed moderate mid-LAD and mid-RCA disease, severe mid-L-Cx stenosis s/p PCI/DES mid Cx and side branch angioplasty of OM1.   Unfortunately she developed a contrast-induced nephropathy after cath and creatinine increased from 2.2 to a peak of 5.5. Additionally, she developed severe nausea and vomiting, gastroparesis and required IV antiplatelet therapy.  During this time she developed epistaxis and acute blood loss anemia requiring transfusion.  She was switched back to oral DAPT. Diuretics were reintroduced and her creatinine began to improve over the following days. Her leukocytosis persisted but all her infectious workup was negative and this was felt to be reactive.    Per chart review, she was admitted 6/22 to [facility] for DKA, started on insulin gtt, IVF and diuretics held. Developed pulmonary vascular congestion vs PNA and was started on IV abx, and diuretics were resumed. She suffered a PEA arrest 01/12/21 with ROSC after 10-15 minutes of CPR and was transferred to [place]; precipitating factor for PEA arrest thought to be respiratory driven. On arrival, she was still in DKA, AKI on CKD with oliguria and Nephrology was consulted for further management. TTE with EF 60%, min MR/TR. Concern for stroke due to left-sided weakness and MRI confirmed anoxic brain injury, but no definite infarct. She was extubated, SCr returned to baseline of 1.6-1.8 and she was discharged to rehab facility.   Zio placed 8/22 for palpitations, showing mostly SB/SR no afib, 66  SVT runs, frequent isolated SVE (11.2%).  Admitted from AHF clinic 10/22 for A/C CHF after failing outpatient escalation in diuretic therapy. She was enrolled in FASTR trial and diuresed > 15 lb.  Hospitalization complicated by AKI on CKD, Scr up to 3.13. RHC showed mild to moderate elevating filling pressures with large v-waves in PCWP tracing suggesting severe MR vs severe diastolic dysfunction. TEE 55-60% showed severe central MR.   Patient underwent TEER of mitral valve with placement of 1XT W and 1NT W clip on 05/18/2021. NT W clip was entangled and had to be deployed into lateral part of valve. No additional clip placement could be preformed. Echo 11/04 with EF 50-55% and residual moderate MR. Given 1 dose IV lasix prior to discharge.  Echo 3/23 showed EF 55-60%, RV OK, moderate MR mean gradient 7-8 mmHg. Saw [name] and MR now moderate, no indication for 3rd clip.   Echo 07/18/22 reviewed by [facility] 07/18/22. LVEF 55-60%, normal RV and moderate MR, mG 7.5 mmHg, essentially unchanged from prior echo.   Follow up 2/24, NYHA II-III and volume mildly up. Given Furoscix for PRN use.    Admitted 2/24 with CP. LHC showed 2 vessel obstructive CAD, with new 90% stenosis pLAD with RFR 0.48. The stent in the LCx is occluded after a large first OM; Patent stent in the distal RCA; Mildly elevated LVEDP 21 mm Hg; Successful PCI of the proximal LAD with DES x 1 overlapping with prior stent. Lcx occlusion to be treated medically. Elevated LVEDP on cath and given IV lasix and discharged home, weight 231 lbs.  Today she returns for post hospital HF follow up with her husband and daughter. Overall feeling fine. She remains SOB with activity and ADLs. Swelling in hands and ankles. Had dizziness on hydral 50, now on 25. Denies palpitations, CP, abnormal bleeding or PND/Orthopnea. Appetite ok. No fever or chills. Weight at home 233 pounds. Taking all medications. Not using metolazone.    Cardiac  Studies: - LHC (2/24): 2v obstructive CAD. New 90% stenosis pLAD with RFR 0.48. The stent in the LCx is occluded after a large first OM. Patent stent in the distal RCA. S/p PCI of pLAD with DES x 1 overlapping with prior stent.  - Echo (1/24): EF 55-60%, normal RV and moderate MR, mG 7.5 mmHg, essentially unchanged from prior echo.   - Echo (3/23): EF 55-60%, RV normal, moderate MR  - Echo 11/22: EF 50-55%, residual moderate MR  - RHC (10/22):   RA = 8 RV = 59/9 PA = 61/17 (35) PCW = mean 23 (v = 49) -> performed multiple wedges Fick cardiac output/index = PVR = 1.6 WU FA sat = 98% PA sat = 69%, 70% PaPi = 5.5 Assessment: 1. Mild to moderately elevated filling pressures with very large v-waves in PCWP tracing suggestive of severe diastolic dysfunction vs severe MR  - TEE (10/22): EF 55-60%, basal inferior akinesis, severe central MR. - Echo (10/22): EF 65-70%, RV ok, moderate MR - Echo (5/21): EF 60-65%, RV ok, Grade II DD. - Echo (2/22): EF 55-60%, RV ok, Grade II DD, mod MR - Echo (3/22): EF 55-60%, Grade II DD, normal RV - Echo (6/22): EF 60%, mod MR/TR (per chart review at [facility]). - Zio (8/19): no arrhythmias, occasional PAC/PVCs - Zio (9/22): mostly SB/SR no afib, 66 SVT runs, frequent isolated SVE (11.2%).  - LHC (3/22): 3 vessel CAD with moderate mid-LAD and mid-RCA stenoses, and severe mid-LCx stenosis; successful PCI of the mid-circumflex, reducing severe 90% stenosis to 0% with a 2.75x22 mm Resolute Onyx DES, provisional side branch angioplasty of the OM1 with a 2.5 mm balloon through the stent struts  Review of systems complete and found to be negative unless listed in HPI.    Past Medical History:  Diagnosis Date   Acquired absence of left great toe ([place])    Acquired absence of other left toe(s) ([place])    ACS (acute coronary syndrome) ([place])    Anemia    Anoxic brain injury ([place])    Anxiety    CAD (coronary artery disease)    DES mLAD 04/07/15; DES dRCA & DES  pPDA 08/30/16; DES mCX & PTCA OM1 09/29/20   Chronic diastolic (congestive) heart failure (HCC)    Chronic pain    Chronic systolic (congestive) heart failure (HCC)    CKD (chronic kidney disease)    Contrast dye induced nephropathy, possible 09/03/2020   COPD (chronic obstructive pulmonary disease) (HCC)    Diabetes (HCC)    Diastolic CHF ([place])    Dyspnea    Family history of breast cancer 10/23/2021   Gastro-esophageal reflux disease without esophagitis    Hyperlipidemia    Hypertension    Hypertensive heart disease with heart failure (HCC)    Hypoxemia    Idiopathic gout, multiple sites    Leukocytosis 03/29/2022   Localized edema    Low back pain    Mitral regurgitation    Mitral regurgitation    Mixed hyperlipidemia    Mixed incontinence    Morbid (severe) obesity due to excess calories ([place])  Neuromuscular disorder (HCC)    Neuropathy    Non-ST elevation (NSTEMI) myocardial infarction ([place])    08/29/20, 09/29/20   OSA (obstructive sleep apnea) 09/03/2020   Other specified hypothyroidism    PAF (paroxysmal atrial fibrillation) (HCC)    s/p pulmonary vein isolation by cryoablation 10/04/15 Dr. Minna Merritts [facility]   PEA (Pulseless electrical activity) [facility])    ~ 01/13/21 at [facility] during admission DKA, volume overload, possible CAP, hypoxia s/p ACLS with ROSC ~ 10-15   Pneumonia    Primary insomnia    Rheumatoid arthritis ([place])    Stroke ([place])    Thyroid disease    Type 2 diabetes mellitus with diabetic nephropathy (HCC)    Vitamin D deficiency     Current Outpatient Medications  Medication Sig Dispense Refill   allopurinol (ZYLOPRIM) 300 MG tablet Take 1 tablet (300 mg total) by mouth daily. 90 tablet 1   aspirin EC 81 MG tablet Take 1 tablet (81 mg total) by mouth daily. Swallow whole. 30 tablet 12   busPIRone (BUSPAR) 5 MG tablet Take 1 tablet (5 mg total) by mouth 3 (three) times daily. 270 tablet 1   carvedilol (COREG) 12.5 MG tablet TAKE ONE TABLET BY  MOUTH TWICE DAILY (Patient taking differently: Take 12.5 mg by mouth 2 (two) times daily with a meal.) 180 tablet 3   clopidogrel (PLAVIX) 75 MG tablet Take 1 tablet (75 mg total) by mouth daily. 90 tablet 1   Continuous Blood Gluc Sensor (FREESTYLE LIBRE 2 SENSOR) MISC USE TO check blood glucose AS DIRECTED AND CHANGE sensor every 14 DAYS     CRANBERRY PO Take 2 tablets by mouth daily.     EPINEPHrine 0.3 mg/0.3 mL IJ SOAJ injection Use as directed for life-threatening allergic reaction. 4 Device 3   Evolocumab (REPATHA SURECLICK) [id number] MG/ML SOAJ Inject 1 Dose into the skin every 14 (fourteen) days. 6 mL 1   ezetimibe (ZETIA) 10 MG tablet Take 1 tablet (10 mg total) by mouth daily. 90 tablet 3   hydrALAZINE (APRESOLINE) 25 MG tablet Take 25 mg by mouth 3 (three) times daily.     HYDROcodone-acetaminophen (NORCO) 7.5-325 MG tablet TAKE ONE TABLET BY MOUTH THREE TIMES DAILY AS NEEDED 90 tablet 0   insulin aspart (NOVOLOG FLEXPEN) 100 UNIT/ML FlexPen 2-10 U before meals based on SSI. (Patient taking differently: Inject 2-10 Units into the skin 3 (three) times daily with meals. Per sliding scale) 15 mL 3   Insulin Pen Needle (BD PEN NEEDLE NANO 2ND GEN) 32G X 4 MM MISC Inject 1 each into the skin in the morning, at noon, in the evening, and at bedtime. 600 each 3   isosorbide mononitrate (IMDUR) 30 MG 24 hr tablet Take 1 tablet (30 mg total) by mouth daily. 90 tablet 1   latanoprost (XALATAN) 0.005 % ophthalmic solution Place 1 drop into both eyes at bedtime.     LEVEMIR FLEXPEN 100 UNIT/ML FlexPen Inject 50 units into THE SKIN daily (Patient taking differently: Inject 50 Units into the skin daily.) 15 mL 1   linaclotide (LINZESS) 145 MCG CAPS capsule Take 1 capsule (145 mcg total) by mouth daily before breakfast. 90 capsule 1   LORazepam (ATIVAN) 0.5 MG tablet TAKE ONE TABLET BY MOUTH ONCE daily AS NEEDED FOR ANXIETY 30 tablet 2   magnesium oxide (MAG-OX) 400 MG tablet Take 2 tablets (800 mg total) by  mouth daily. Take 2 (400 mg) daily=800 mg 60 tablet 1   metolazone (  ZAROXOLYN) 2.5 MG tablet Take 1 tablet (2.5 mg total) by mouth as needed. 3 tablet 0   nitroGLYCERIN (NITROSTAT) 0.4 MG SL tablet Dissolve 1 tab under tongue as needed for chest pain. May repeat every 5 minutes x 2 doses. If no relief call 9-1-1. Please call [phone] to schedule an appointment for future refills. Thank you. 50 tablet 0   Omega-3 Fatty Acids (FISH OIL PO) Take 1 capsule by mouth daily.     OXYGEN Inhale 2 L into the lungs at bedtime.     pantoprazole (PROTONIX) 40 MG tablet Take 1 tablet (40 mg total) by mouth daily. 90 tablet 1   potassium chloride SA (KLOR-CON M) 20 MEQ tablet TAKE ONE TABLET BY MOUTH ONCE daily 90 tablet 1   Probiotic CAPS Take 1 capsule by mouth daily.     promethazine (PHENERGAN) 25 MG tablet TAKE ONE TABLET BY MOUTH daily AS NEEDED 30 tablet 3   torsemide (DEMADEX) 20 MG tablet TAKE 4 TABLETS BY MOUTH ONCE DAILY 336 tablet 3   No current facility-administered medications for this encounter.   Allergies  Allergen Reactions   Naproxen Hives and Rash   Celecoxib Other (See Comments)    Stomach pain   Aspirin Other (See Comments)    CAN NOT TAKE DUE TO ULCERS (documented previously) but takes every day currently (as of 09/29/20) without reported issues Can take coated aspirin   Glucosamine Hives, Swelling and Other (See Comments)    Angioedema    Iron     IV iron transfusion - hives - Feb 2022 hospitalization   Metoclopramide Other (See Comments)    Facial twitching and stuttering   Metolazone Other (See Comments)    Acute renal failure   Other Hives    Strawberry allergy   Shellfish-Derived Products Hives, Swelling and Other (See Comments)    Angioedema    Statins Other (See Comments)    Muscle pain   Strawberry (Diagnostic)    Strawberry Extract    Strawberry Flavor    Strawberry Leaves Extract    Tramadol Nausea And Vomiting        Social History   Socioeconomic  History   Marital status: Married    Spouse name: [name]   Number of children: 2   Years of education: 16   Highest education level: Master's degree (e.g., MA, Dana, MEng, MEd, MSW, MBA)  Occupational History   Occupation: on disability/retired early former Education officer, museum  Tobacco Use   Smoking status: Former    Types: Cigarettes    Start date: 1983    Quit date: 1984    Years since quitting: 40.2    Passive exposure: Past   Smokeless tobacco: Never  Vaping Use   Vaping Use: Never used  Substance and Sexual Activity   Alcohol use: Never   Drug use: Never   Sexual activity: Not on file  Other Topics Concern   Not on file  Social History Narrative   Not on file   Social Determinants of Health   Financial Resource Strain: Medium Risk (08/21/2022)   Overall Financial Resource Strain (CARDIA)    Difficulty of Paying Living Expenses: Somewhat hard  Food Insecurity: Food Insecurity Present (07/26/2022)   Hunger Vital Sign    Worried About Running Out of Food in the Last Year: Sometimes true    Ran Out of Food in the Last Year: Sometimes true  Transportation Needs: No Transportation Needs (07/25/2022)   PRAPARE - Transportation  Lack of Transportation (Medical): No    Lack of Transportation (Non-Medical): No  Physical Activity: Inactive (08/21/2022)   Exercise Vital Sign    Days of Exercise per Week: 0 days    Minutes of Exercise per Session: 0 min  Stress: Stress Concern Present (08/01/2022)   [place]    Feeling of Stress : To some extent  Social Connections: Moderately Integrated (07/25/2022)   Social Connection and Isolation Panel [NHANES]    Frequency of Communication with Friends and Family: More than three times a week    Frequency of Social Gatherings with Friends and Family: More than three times a week    Attends Religious Services: More than 4 times per year    Active Member of [employer] or Organizations: No     Attends Archivist Meetings: Never    Marital Status: Married  Human resources officer Violence: Not At Risk (07/25/2022)   Humiliation, Afraid, Rape, and Kick questionnaire    Fear of Current or Ex-Partner: No    Emotionally Abused: No    Physically Abused: No    Sexually Abused: No   Family History  Problem Relation Age of Onset   Breast cancer Mother 103       contralateral breast ca dx 43   Coronary artery disease Mother    Hypertension Mother    Hyperlipidemia Mother    Diabetes type II Mother    Lung cancer Mother 86   Diabetes Father    Hypertension Father    Mental illness Sister    Bipolar disorder Other    Depression Other    Schizophrenia Other    Cancer Other        MGF's sisters x2; unknown type   BP 92/64   Pulse 70   Wt 106.1 kg (234 lb)   SpO2 97%   BMI 39.55 kg/m   Wt Readings from Last 3 Encounters:  09/21/22 106.1 kg (234 lb)  09/17/22 106.1 kg (234 lb)  09/07/22 104.8 kg (231 lb)   PHYSICAL EXAM: General:  NAD. No resp difficulty, arrived in [facility] HEENT: Normal Neck: Supple. JVP 7-8, thick neck. Carotids 2+ bilat; no bruits. No lymphadenopathy or thryomegaly appreciated. Cor: PMI nondisplaced. Regular rate & rhythm. No rubs, gallops, 2/6 MR Lungs: Clear Abdomen: Soft, obese, nontender, nondistended. No hepatosplenomegaly. No bruits or masses. Good bowel sounds. Extremities: No cyanosis, clubbing, rash, edema Neuro: Alert & oriented x 3, cranial nerves grossly intact. Moves all 4 extremities w/o difficulty. Affect pleasant.  CardioMEMs: 12, goal 10 (Personally reviewed)  ECG (personally reviewed): NSR 70 bpm, QTc 481 msec  ASSESSMENT & PLAN: 1.  Chronic Diastolic Heart Failure - Echo 08/2020 EF 55-60%  - Echo (3/22): EF 55-60%, Grade II DD, normal RV - Echo (6/22): EF 60%, mod MR/TR (per chart review at [facility]). - Admit 10/22 with a/c CHF, enrolled in FASTR trial and diuresed 15 lbs. Severe MR likely contributing to decompensation.  -  RHC (10/22): mild to moderate elevated filling pressures, large v-waves PCWP tracing - TEE (10/22): EF 55-60%, basal inferior akinesis, severe central MR.>>Underwent TEER>>mod residual MR - Echo (11/22): EF 50-55%, severe LV HK, moderate MR - Echo (3/23): EF 55-60%, RV OK, moderate MR mean gradient 7-8 mmHg - Echo (1/24): EF 55-60%, RV normal, mod MR mG 7.5 mmHg  - Stable NYHA III. Volume status slightly elevated on CardioMEMs (dPAP 12, goal 10) and by weight. - She has Furoscix to use PRN  to help deal with fluid overload and try to minimize metolazone use and related episodes of AKI. Instructed to use Furoscix x 1 + 40 KCL tomorrow (Sat) and hold oral torsemide. Goal home weight 228 lbs.  - Continue torsemide 80 mg daily + 20 KCL daily (resume on Sunday)  - Continue hydralazine 25 mg tid + Imdur 30 mg daily. - Continue carvedilol 12.5 mg bid. - No SGLT2i with T1DM. - Continue TED hose. - Referred to CR at [facility] from 09/17/22 reviewed, K 4.2, SCr 1.89   2. CAD - Known CAD with prior stents to mid LAD and pRCA.  - NSTEMI (2/22). Cath deferred due to AKI. Treated medically.  - NSTEMI (7/22)-->S/p PCI/DES to mid Cx, balloon angioplasty to OM1  - LHC (2/24) for chest pain--> new 90% stenosis pLAD. Successful PCI of pLAD with DES x 1 overlapping with prior stent. - Plan DAPT indefinitely with ASA + Plavix - No chest pain - Lipids under good control w/ Repatha + Zetia Followed by Dr. Debara Pickett.  - She's been referred to CR at [facility].   3. CKD IIIb - Prior h/o CIN following LHC - Previous SCr baseline 1.6-1.8 with multiple AKIs over the last year. New baseline ~ 2.0-2.5 - Followed by Nephrology in [facility] - Furoscix to use PRN and limit metolazone use - Labs from 09/17/22 reviewed, K 4.2, SCr 1.89  4. Mitral regurgitation - Severe central MR on TEE on 10/22.   - S/p TEER 05/18/2021 - two clips placed, one became entangled and had to be deployed. - Moderate residual MR on echo  11/22. - Recent echo 1/24 with stable moderate MR, mG 7.5 mmHg  - Structural team following. No indication for re-clip   5. OSA - Continue CPAP qhs.    6. DM1 - Hgb A1c 7.4 (2/24) - No SGLT2i.   7. Chronic Anemia - Baseline ~10  felt to be 2/2 renal disease.  - Hx severe epistaxis and ABLA requiring transfusion. Hx rash with IV iron. - Following with Heme/Onc. Receiving Retacrit monthly.  8. Palpitations - Hx PAF s/p ablation at least 10 years ago. No documented recurrence. Not currently on anticoagulation. Would need to consider risk/benefits of anticoagulation if recurs especially with history of anemia requiring transfusion - Zio XT 14 day (8/22) showed mostly SB/SR no afib, 66 SVT runs, frequent isolated SVE (11.2%). - Symptomatically much improved - Continue carvedilol 12.5 mg bid.  Follow up in 3 months, as scheduled.  Rafael Bihari, FNP  12:16 PM   FUROSCIX prescribed  Patient viewed patient education video with QR code for Caren Griffins code for [place] placed on AVS  Call [place] [employer] at [phone] for questions regarding on body infuser.  Day 1  FUROSCIX 80 mg once daily  via on body infuser + KDUR 40  Hold torsemide while using Furoscix

## 2022-09-21 ENCOUNTER — Encounter: Payer: Self-pay | Admitting: Oncology

## 2022-09-21 ENCOUNTER — Encounter (HOSPITAL_COMMUNITY): Payer: Self-pay

## 2022-09-21 ENCOUNTER — Ambulatory Visit (HOSPITAL_COMMUNITY)
Admission: RE | Admit: 2022-09-21 | Discharge: 2022-09-21 | Disposition: A | Discharge status: Home / Self Care | Payer: PPO | Source: Ambulatory Visit | Attending: Family Medicine | Admitting: Family Medicine

## 2022-09-21 VITALS — BP 92/64 | HR 70 | Wt 234.0 lb

## 2022-09-21 DIAGNOSIS — Z79899 Other long term (current) drug therapy: Secondary | ICD-10-CM | POA: Insufficient documentation

## 2022-09-21 DIAGNOSIS — E1043 Type 1 diabetes mellitus with diabetic autonomic (poly)neuropathy: Secondary | ICD-10-CM | POA: Insufficient documentation

## 2022-09-21 DIAGNOSIS — N1832 Chronic kidney disease, stage 3b: Secondary | ICD-10-CM | POA: Diagnosis not present

## 2022-09-21 DIAGNOSIS — N184 Chronic kidney disease, stage 4 (severe): Secondary | ICD-10-CM

## 2022-09-21 DIAGNOSIS — I252 Old myocardial infarction: Secondary | ICD-10-CM | POA: Insufficient documentation

## 2022-09-21 DIAGNOSIS — Z794 Long term (current) use of insulin: Secondary | ICD-10-CM | POA: Insufficient documentation

## 2022-09-21 DIAGNOSIS — D631 Anemia in chronic kidney disease: Secondary | ICD-10-CM | POA: Diagnosis not present

## 2022-09-21 DIAGNOSIS — Z95818 Presence of other cardiac implants and grafts: Secondary | ICD-10-CM | POA: Diagnosis not present

## 2022-09-21 DIAGNOSIS — Z7902 Long term (current) use of antithrombotics/antiplatelets: Secondary | ICD-10-CM | POA: Diagnosis not present

## 2022-09-21 DIAGNOSIS — N179 Acute kidney failure, unspecified: Secondary | ICD-10-CM | POA: Diagnosis not present

## 2022-09-21 DIAGNOSIS — R002 Palpitations: Secondary | ICD-10-CM

## 2022-09-21 DIAGNOSIS — D62 Acute posthemorrhagic anemia: Secondary | ICD-10-CM | POA: Insufficient documentation

## 2022-09-21 DIAGNOSIS — I5032 Chronic diastolic (congestive) heart failure: Secondary | ICD-10-CM | POA: Diagnosis not present

## 2022-09-21 DIAGNOSIS — R040 Epistaxis: Secondary | ICD-10-CM | POA: Insufficient documentation

## 2022-09-21 DIAGNOSIS — I480 Paroxysmal atrial fibrillation: Secondary | ICD-10-CM | POA: Insufficient documentation

## 2022-09-21 DIAGNOSIS — I130 Hypertensive heart and chronic kidney disease with heart failure and stage 1 through stage 4 chronic kidney disease, or unspecified chronic kidney disease: Secondary | ICD-10-CM | POA: Insufficient documentation

## 2022-09-21 DIAGNOSIS — Z9889 Other specified postprocedural states: Secondary | ICD-10-CM | POA: Diagnosis not present

## 2022-09-21 DIAGNOSIS — I2510 Atherosclerotic heart disease of native coronary artery without angina pectoris: Secondary | ICD-10-CM | POA: Diagnosis not present

## 2022-09-21 DIAGNOSIS — I5042 Chronic combined systolic (congestive) and diastolic (congestive) heart failure: Secondary | ICD-10-CM | POA: Insufficient documentation

## 2022-09-21 DIAGNOSIS — E1022 Type 1 diabetes mellitus with diabetic chronic kidney disease: Secondary | ICD-10-CM | POA: Insufficient documentation

## 2022-09-21 DIAGNOSIS — G4733 Obstructive sleep apnea (adult) (pediatric): Secondary | ICD-10-CM | POA: Diagnosis not present

## 2022-09-21 DIAGNOSIS — Z955 Presence of coronary angioplasty implant and graft: Secondary | ICD-10-CM | POA: Insufficient documentation

## 2022-09-21 DIAGNOSIS — I340 Nonrheumatic mitral (valve) insufficiency: Secondary | ICD-10-CM | POA: Insufficient documentation

## 2022-09-21 DIAGNOSIS — K3184 Gastroparesis: Secondary | ICD-10-CM | POA: Diagnosis not present

## 2022-09-21 MED ORDER — HYDRALAZINE HCL 25 MG PO TABS: 25.0000 mg | ORAL_TABLET | Freq: Three times a day (TID) | ORAL | 8 refills | Status: DC

## 2022-09-21 NOTE — Addendum Note (Signed)
Addended by: Juanetta Beets on: 09/21/2022 01:41 PM   Modules accepted: Orders

## 2022-09-21 NOTE — Patient Instructions (Addendum)
Thank you for coming in today  EKG was done today   Labs were done today, if any labs are abnormal the clinic will call you No news is good news  Medications: HOLD Torsemide use Furoscix with 40 meq of Potassium and Sunday Resume Torsemide 80 mg   Follow up appointments:  Your physician recommends that you schedule a follow-up appointment in:     Do the following things EVERYDAY: Weigh yourself in the morning before breakfast. Write it down and keep it in a log. Take your medicines as prescribed Eat low salt foods--Limit salt (sodium) to 2000 mg per day.  Stay as active as you can everyday Limit all fluids for the day to less than 2 liters   At the [facility], you and your health needs are our priority. As part of our continuing mission to provide you with exceptional heart care, we have created designated Provider Care Teams. These Care Teams include your primary Cardiologist (physician) and Advanced Practice Providers (APPs- Physician Assistants and Nurse Practitioners) who all work together to provide you with the care you need, when you need it.   You may see any of the following providers on your designated Care Team at your next follow up: Dr [name] Dr Loralie Champagne Dr. Roxana Hires, NP Lyda Jester, [place] [facility] [place], [place] Forestine Na, NP Audry Riles, PharmD   Please be sure to bring in all your medications bottles to every appointment.    Thank you for choosing [facility]  If you have any questions or concerns before your next appointment please send [name] a message through [place] or call our office at [phone].    TO LEAVE A MESSAGE FOR THE NURSE SELECT OPTION 2, PLEASE LEAVE A MESSAGE INCLUDING: YOUR NAME DATE OF BIRTH CALL BACK NUMBER REASON FOR CALL**this is important as we prioritize the call backs  YOU WILL RECEIVE A CALL BACK THE SAME DAY AS LONG AS  YOU CALL BEFORE 4:00 PM

## 2022-09-22 DIAGNOSIS — R82998 Other abnormal findings in urine: Secondary | ICD-10-CM | POA: Insufficient documentation

## 2022-09-22 NOTE — Assessment & Plan Note (Signed)
Checked UA. 

## 2022-09-24 ENCOUNTER — Inpatient Hospital Stay: Payer: PPO | Attending: Oncology

## 2022-09-24 VITALS — BP 117/46 | HR 74 | Temp 97.6°F | Resp 18 | Ht 64.5 in | Wt 234.0 lb

## 2022-09-24 DIAGNOSIS — E1142 Type 2 diabetes mellitus with diabetic polyneuropathy: Secondary | ICD-10-CM | POA: Diagnosis not present

## 2022-09-24 DIAGNOSIS — N1832 Chronic kidney disease, stage 3b: Secondary | ICD-10-CM | POA: Diagnosis not present

## 2022-09-24 DIAGNOSIS — F330 Major depressive disorder, recurrent, mild: Secondary | ICD-10-CM | POA: Diagnosis not present

## 2022-09-24 DIAGNOSIS — D631 Anemia in chronic kidney disease: Secondary | ICD-10-CM | POA: Diagnosis not present

## 2022-09-24 DIAGNOSIS — I509 Heart failure, unspecified: Secondary | ICD-10-CM | POA: Diagnosis not present

## 2022-09-24 DIAGNOSIS — E113591 Type 2 diabetes mellitus with proliferative diabetic retinopathy without macular edema, right eye: Secondary | ICD-10-CM | POA: Diagnosis not present

## 2022-09-24 DIAGNOSIS — I25118 Atherosclerotic heart disease of native coronary artery with other forms of angina pectoris: Secondary | ICD-10-CM | POA: Diagnosis not present

## 2022-09-24 DIAGNOSIS — Z79899 Other long term (current) drug therapy: Secondary | ICD-10-CM | POA: Diagnosis not present

## 2022-09-24 DIAGNOSIS — Z794 Long term (current) use of insulin: Secondary | ICD-10-CM | POA: Diagnosis not present

## 2022-09-24 DIAGNOSIS — D649 Anemia, unspecified: Secondary | ICD-10-CM | POA: Diagnosis not present

## 2022-09-24 DIAGNOSIS — I130 Hypertensive heart and chronic kidney disease with heart failure and stage 1 through stage 4 chronic kidney disease, or unspecified chronic kidney disease: Secondary | ICD-10-CM | POA: Diagnosis not present

## 2022-09-24 DIAGNOSIS — E785 Hyperlipidemia, unspecified: Secondary | ICD-10-CM | POA: Diagnosis not present

## 2022-09-24 DIAGNOSIS — E1122 Type 2 diabetes mellitus with diabetic chronic kidney disease: Secondary | ICD-10-CM | POA: Diagnosis not present

## 2022-09-24 DIAGNOSIS — N1831 Chronic kidney disease, stage 3a: Secondary | ICD-10-CM | POA: Diagnosis not present

## 2022-09-24 MED ORDER — EPOETIN ALFA-EPBX 20000 UNIT/ML IJ SOLN
20000.0000 [IU] | Freq: Once | INTRAMUSCULAR | Status: AC
  Administered 2022-09-24: 20000 [IU] via SUBCUTANEOUS
  Filled 2022-09-24: qty 1

## 2022-09-24 NOTE — Patient Instructions (Signed)

## 2022-09-25 ENCOUNTER — Ambulatory Visit: Payer: Self-pay | Admitting: Licensed Clinical Social Worker

## 2022-09-25 NOTE — Patient Instructions (Signed)
Visit Information  Thank you for taking time to visit with me today. Please don't hesitate to contact me if I can be of assistance to you before our next scheduled telephone appointment.  Following are the goals we discussed today:    Our next appointment is by telephone on 10/30/22 at 2:00 PM   Please call the care guide team at [phone] if you need to cancel or reschedule your appointment.   If you are experiencing a Mental Health or [place] or need someone to talk to, please go to [facility] [place] [phone])   Following is a copy of your full plan of care:   Interventions  Spoke with  client today via phone about client needs. Client said she was recently hospitalized. She said she had to have a stint placed in her heart. She said she has had a stint placed in her heart previously.  She said her hospital care went well and she was glad to be back home Client said she sometimes has difficulty sleeping due to stress issues Disussed medication procurement of client. Discussed transport situation of client. She said her spouse drives her to and from her appointments and to complete errands as needed. Discussed oxygen use. Client uses oxygen at 2 liters via nasal canula at night to help with breathing Discussed ADLs completion. Client completes ADLs. She is sometimes fatigued afer doing certain ADLs  Discussed mood of client . Client is taking medications as prescribed. She said she does get anxious occasionally and sometimes has difficulty sleeping. LCSW spoke with client about relaxation techniques Provided counseling support for client Encouraged Luanna to call LCSW as needed for SW support at [phone].   Ms. Caverly was given information about Care Management services by the embedded care coordination team including:  Care Management services include personalized support from designated clinical staff  supervised by her physician, including individualized plan of care and coordination with other care providers 24/7 contact phone numbers for assistance for urgent and routine care needs. The patient may stop CCM services at any time (effective at the end of the month) by phone call to the office staff.  Patient agreed to services and verbal consent obtained.   Norva Riffle.Janyra Barillas MSW, [place] Holiday representative [facility] Care Management [phone]

## 2022-09-25 NOTE — Patient Outreach (Signed)
  Care Coordination   Follow Up Visit Note   09/25/2022 Name: Dana [name] MRN: [id number] DOB: 09-24-1960  Dana [name] is a 62 y.o. year old female who sees Cox, Kirsten, MD for primary care. I spoke with  Dana [name] by phone today.  What matters to the patients health and wellness today? Patient would like to talk with LCSW  about managing stress, especially dealing with financial challenges.    Goals Addressed               This Visit's Progress     patient would like to talk with LCSW about managing stress especially dealing with financial challenges (pt-stated)        Interventions  Spoke with  client today via phone about client needs. Client said she was recently hospitalized. She said she had to have a stint placed in her heart. She said she has had a stint placed in her heart previously.  She said her hospital care went well and she was glad to be back home Client said she sometimes has difficulty sleeping due to stress issues Disussed medication procuremenet of client. Discussed transport situation of client. She said her spouse drives her to and from her appointments and to complete errands as needed. Discussed oxygen use. Client uses oxygen at 2 liters via nasal canula at night to help with breathing Discussed ADLs completion. Client completes ADLs. She is sometimes fatigued afer doing certain ADLs  Discussed mood of client . Client is taking medications as prescribed. She said she does get anxious occasionally and sometimes has difficulty sleeping. LCSW spoke with client about relaxation techniques Provided counseling support for client Encouraged Dana [name] to call LCSW as needed for SW support at [phone].      SDOH assessments and interventions completed:  Yes  SDOH Interventions Today    Flowsheet Row Most Recent Value  SDOH Interventions   Depression Interventions/Treatment  Counseling  Physical Activity Interventions Other (Comments)  [may have some  pain occasionally when walking]  Stress Interventions Provide Counseling  [has stress related to managing medical needs]        Care Coordination Interventions:  Yes, provided   Interventions Today    Flowsheet Row Most Recent Value  Chronic Disease   Chronic disease during today's visit Other  [discussed client needs. discussed recent client hospitalization]  General Interventions   General Interventions Discussed/Reviewed General Interventions Discussed, Community Resources  [discussed program support]  Exercise Interventions   Exercise Discussed/Reviewed Exercise Reviewed  [client tries to walk for exercise as she is able]  Education Interventions   Education Provided Provided Education  Provided Verbal Education On [facility] Discussed/Reviewed Anxiety, Coping Strategies  [discussed relaxation techniques of client: walking for exercise, watcing favorite TV shows, crocheting.]        Follow up plan: Follow up call scheduled for 10/30/22 at 2:00 PM     Encounter Outcome:  Pt. Visit Completed   Norva Riffle.Zaion Hreha MSW, [place] [facility] representative [facility] Care Management [phone]

## 2022-09-26 ENCOUNTER — Telehealth: Payer: PPO

## 2022-09-26 ENCOUNTER — Other Ambulatory Visit: Payer: Self-pay | Admitting: Family Medicine

## 2022-09-26 ENCOUNTER — Telehealth: Payer: Self-pay

## 2022-09-26 ENCOUNTER — Ambulatory Visit (INDEPENDENT_AMBULATORY_CARE_PROVIDER_SITE_OTHER): Payer: PPO

## 2022-09-26 DIAGNOSIS — I130 Hypertensive heart and chronic kidney disease with heart failure and stage 1 through stage 4 chronic kidney disease, or unspecified chronic kidney disease: Secondary | ICD-10-CM

## 2022-09-26 DIAGNOSIS — F411 Generalized anxiety disorder: Secondary | ICD-10-CM

## 2022-09-26 DIAGNOSIS — E1142 Type 2 diabetes mellitus with diabetic polyneuropathy: Secondary | ICD-10-CM

## 2022-09-26 DIAGNOSIS — F330 Major depressive disorder, recurrent, mild: Secondary | ICD-10-CM

## 2022-09-26 DIAGNOSIS — F439 Reaction to severe stress, unspecified: Secondary | ICD-10-CM

## 2022-09-26 NOTE — Chronic Care Management (AMB) (Signed)
Chronic Care Management   CCM RN Visit Note  09/26/2022 Name: ELLIANA SANDIEGO MRN: [id number] DOB: 1960/10/19  Subjective: Dana [name] is a 62 y.o. year old female who is a primary care patient of Cox, Kirsten, MD. The patient was referred to the Chronic Care Management team for assistance with care management needs subsequent to provider initiation of CCM services and plan of care.    Today's Visit:  Engaged with patient by telephone for follow up visit.        Goals Addressed             This Visit's Progress    CCM Expected Outcome:  Monitor, Self-Manage and Reduce Symptoms of Diabetes       Current Barriers:  Chronic Disease Management support and education needs related to effective management of DM Lab Results  Component Value Date   HGBA1C 7.4 (H) 08/31/2022     Planned Interventions: Provided education to patient about basic DM disease process. The patient states that her blood sugars are doing good. Denies any acute changes in her DM. ; Reviewed medications with patient and discussed importance of medication adherence. Patient states compliance with medications;        Reviewed prescribed diet with patient heart healthy/ADA diet ; Counseled on importance of regular laboratory monitoring as prescribed. Has regular lab work done;        Discussed plans with patient for ongoing care management follow up and provided patient with direct contact information for care management team;      Provided patient with written educational materials related to hypo and hyperglycemia and importance of correct treatment;       Reviewed scheduled/upcoming provider appointments including: 12-17-2022 at 730 am;         Advised patient, providing education and rationale, to check cbg twice daily and when you have symptoms of low or high blood sugar and record. Range has been 123 to 172.        call provider for findings outside established parameters;       Referral made to community  resources care guide team for assistance with food insecurities and resources in her area to help with expressed needs. Has talked with care guides and had resources mailed.  ;      Review of patient status, including review of consultants reports, relevant laboratory and other test results, and medications completed;       Advised patient to discuss changes in DM, questions, and concerns with provider;      Screening for signs and symptoms of depression related to chronic disease state;        Assessed social determinant of health barriers;         Symptom Management: Take medications as prescribed   Attend all scheduled provider appointments Call provider office for new concerns or questions  call the Suicide and Crisis Lifeline: 988 call the [place] National Suicide Prevention Lifeline: 954-381-7584 or TTY: (256)546-8538 TTY 401-598-3956) to talk to a trained counselor call 1-800-273-TALK (toll free, 24 hour hotline) if experiencing a Mental Health or [place]  keep appointment with eye doctor check feet daily for cuts, sores or redness trim toenails straight across manage portion size wash and dry feet carefully every day wear comfortable, cotton socks wear comfortable, well-fitting shoes  Follow Up Plan: Telephone follow up appointment with care management team member scheduled for: 12-05-2022 at 230 pm       CCM Expected Outcome:  Monitor, Self-Manage  and Reduce Symptoms of: Depression, Anxiety, Stress       Current Barriers:  Knowledge Deficits related to resources available to help with expressed needs at this time due to her husband losing his job and his unemployment has expired Care Coordination needs related to resources to help meet financial needs at this time in a patient with stress, anxiety, and depression  Chronic Disease Management support and education needs related to effective management of stress, anxiety, and depression Financial Constraints.    Planned Interventions: Evaluation of current treatment plan related to depression, stress, and anxiety and patient's adherence to plan as established by provider. The patient is doing better since getting out of the hospital. States that she is thankful to be at home and thankful they found what had caused her chest pain. She did ask about results of UA from her visit with the pcp on 09-17-2022. She is having dark and very cloudy, foul smelling urine when urinating. She was catheterized with a foley in the hospital the end of February. Message received from the clinical staff that the patient is coming by the office tomorrow am to repeat a UA. Education and support provided. Will continue to monitor for new changes or concerns. Advised patient to call Rudolph at [facility] for assistance with help getting her husbands medication due to being unemployed with no insurance. [phone] and ask for [place] Adie Provided education to patient re: resources and help available to assist the patient with needs at this time due to her husband being unemployed Reviewed medications with patient and discussed compliance. The patient is compliant with medications and works with the pharm D for her medication needs. Denies any issues with non-compliance Collaborated with pharm D regarding [facility] assistance for the patients husband due to no insurance and being unemployed. Provided information to the patient by a return call to the patient.  Provided patient with care guide and SW support as well as support from the interdisciplinary team for management of stress, anxiety, and depression  educational materials related to effective management of stress, anxiety, and depression. The patient is actively working with the LCSW for ongoing support and education. Reviewed scheduled/upcoming provider appointments including 12-17-2022 at 730 am Care Guide referral for community resources for [facility] for  food insecurities and assistance with utilities. Has information from the care guides  Social Work referral for education and support and resources for effective management of stress at this time as her husband is out of work and she is concerned about financial issues Discussed plans with patient for ongoing care management follow up and provided patient with direct contact information for care management team Advised patient to discuss changes in mood, increased anxiety, depression, stress, or other mental health needs.  with provider Screening for signs and symptoms of depression related to chronic disease state  Assessed social determinant of health barriers  Symptom Management: Take medications as prescribed   Attend all scheduled provider appointments Call provider office for new concerns or questions  call the Suicide and Crisis Lifeline: 988 call the [place] National Suicide Prevention Lifeline: 3046282591 or TTY: (410) 083-2222 TTY 501-864-5654) to talk to a trained counselor call 1-800-273-TALK (toll free, 24 hour hotline) if experiencing a Mental Health or [place]   Follow Up Plan: Telephone follow up appointment with care management team member scheduled for: 12-05-2022 at 230 pm       CCM Expected Outcome:  Monitor, Self-Manage, and Reduce Symptoms of Hypertension  Current Barriers:  Chronic Disease Management support and education needs related to effective management of HTN BP Readings from Last 3 Encounters:  09/24/22 (!) 117/46  09/21/22 92/64  09/17/22 134/62     Planned Interventions: Evaluation of current treatment plan related to hypertension self management and patient's adherence to plan as established by provider. The patient states her blood pressures are stable at this time. She does take at home and records. Sees specialist on a regular basis. Was having CP on 09-07-2022 and went to the hospital to be evaluated. She had a heart  catheterization and had stenting of her LAD due to a 90% LAD. The patient is feeling much better. She had went to the hospital 3 weeks before that with the same symptoms but they told her it was an exacerbation of her HF. A cardiac PA was in the ER and listened to her story and she was able to get cath and be discharged home. She is doing well and thankful for the care she received.  ;   Provided education to patient re: stroke prevention, s/s of heart attack and stroke. The patient has high risk factors for HA and stroke. Had 2 heart attacks in 2022. States that she had taken NTG with minimal relief. Reflective listening. Praised for being proactive in her care and seeking help; Reviewed prescribed diet heart healthy/ADA diet. Review and education  Reviewed medications with patient and discussed importance of compliance. The patient states compliance with medications. Denies any medication needs at this time.;  Discussed plans with patient for ongoing care management follow up and provided patient with direct contact information for care management team; Advised patient, providing education and rationale, to monitor blood pressure daily and record, calling PCP for findings outside established parameters. The patient states her blood pressures systolic are around [id number] and diastolic about 63 to 66;  Reviewed scheduled/upcoming provider appointments including: 12-17-2022 at 730 am Advised patient to discuss changes in her blood pressures or heart health  with provider; Provided education on prescribed diet heart healthy/ADA diet. The patient is working with DSS currently hopeful to get food stamps due to her husband being out of work. Also talks to the LCSW on a regular basis;  Discussed complications of poorly controlled blood pressure such as heart disease, stroke, circulatory complications, vision complications, kidney impairment, sexual dysfunction;  Screening for signs and symptoms of depression related  to chronic disease state;  Assessed social determinant of health barriers;   Symptom Management: Take medications as prescribed   Attend all scheduled provider appointments Call provider office for new concerns or questions  call the Suicide and Crisis Lifeline: 988 call the [place] National Suicide Prevention Lifeline: 313-649-3750 or TTY: (680)778-6865 TTY 628-463-9379) to talk to a trained counselor call 1-800-273-TALK (toll free, 24 hour hotline) if experiencing a Mental Health or [place]  check blood pressure 3 times per week write blood pressure results in a log or diary learn about high blood pressure keep a blood pressure log take blood pressure log to all doctor appointments call doctor for signs and symptoms of high blood pressure develop an action plan for high blood pressure keep all doctor appointments take medications for blood pressure exactly as prescribed report new symptoms to your doctor  Follow Up Plan: Telephone follow up appointment with care management team member scheduled for: 12-05-2022 at 230 pm          Plan:Telephone follow up appointment with care management team member scheduled for:  12-05-2022 at 230 pm  Holden, MSN, [place] Management Direct Number: [phone]

## 2022-09-26 NOTE — Patient Instructions (Addendum)
Please call the care guide team at [phone] if you need to cancel or reschedule your appointment.   If you are experiencing a Mental Health or [place] or need someone to talk to, please call the Suicide and Crisis Lifeline: 988 call the [place] National Suicide Prevention Lifeline: 478-777-7793 or TTY: 480-864-3475 TTY 423-807-5723) to talk to a trained counselor call 1-800-273-TALK (toll free, 24 hour hotline) go to [facility] [phone])   Following is a copy of the CCM Program Consent:  CCM service includes personalized support from designated clinical staff supervised by the physician, including individualized plan of care and coordination with other care providers 24/7 contact phone numbers for assistance for urgent and routine care needs. Service will only be billed when office clinical staff spend 20 minutes or more in a month to coordinate care. Only one practitioner may furnish and bill the service in a calendar month. The patient may stop CCM services at amy time (effective at the end of the month) by phone call to the office staff. The patient will be responsible for cost sharing (co-pay) or up to 20% of the service fee (after annual deductible is met)  Following is a copy of your full provider care plan:   Goals Addressed             This Visit's Progress    CCM Expected Outcome:  Monitor, Self-Manage and Reduce Symptoms of Diabetes       Current Barriers:  Chronic Disease Management support and education needs related to effective management of DM Lab Results  Component Value Date   HGBA1C 7.4 (H) 08/31/2022     Planned Interventions: Provided education to patient about basic DM disease process. The patient states that her blood sugars are doing good. Denies any acute changes in her DM. ; Reviewed medications with patient and discussed importance of medication adherence. Patient  states compliance with medications;        Reviewed prescribed diet with patient heart healthy/ADA diet ; Counseled on importance of regular laboratory monitoring as prescribed. Has regular lab work done;        Discussed plans with patient for ongoing care management follow up and provided patient with direct contact information for care management team;      Provided patient with written educational materials related to hypo and hyperglycemia and importance of correct treatment;       Reviewed scheduled/upcoming provider appointments including: 12-17-2022 at 730 am;         Advised patient, providing education and rationale, to check cbg twice daily and when you have symptoms of low or high blood sugar and record. Range has been 123 to 172.        call provider for findings outside established parameters;       Referral made to community resources care guide team for assistance with food insecurities and resources in her area to help with expressed needs. Has talked with care guides and had resources mailed.  ;      Review of patient status, including review of consultants reports, relevant laboratory and other test results, and medications completed;       Advised patient to discuss changes in DM, questions, and concerns with provider;      Screening for signs and symptoms of depression related to chronic disease state;        Assessed social determinant of health barriers;  Symptom Management: Take medications as prescribed   Attend all scheduled provider appointments Call provider office for new concerns or questions  call the Suicide and Crisis Lifeline: 988 call the [place] National Suicide Prevention Lifeline: 480-856-9002 or TTY: 450-687-8238 TTY 201-383-8120) to talk to a trained counselor call 1-800-273-TALK (toll free, 24 hour hotline) if experiencing a Mental Health or [place]  keep appointment with eye doctor check feet daily for cuts, sores or redness trim  toenails straight across manage portion size wash and dry feet carefully every day wear comfortable, cotton socks wear comfortable, well-fitting shoes  Follow Up Plan: Telephone follow up appointment with care management team member scheduled for: 12-05-2022 at 230 pm       CCM Expected Outcome:  Monitor, Self-Manage and Reduce Symptoms of: Depression, Anxiety, Stress       Current Barriers:  Knowledge Deficits related to resources available to help with expressed needs at this time due to her husband losing his job and his unemployment has expired Care Coordination needs related to resources to help meet financial needs at this time in a patient with stress, anxiety, and depression  Chronic Disease Management support and education needs related to effective management of stress, anxiety, and depression Financial Constraints.   Planned Interventions: Evaluation of current treatment plan related to depression, stress, and anxiety and patient's adherence to plan as established by provider. The patient is doing better since getting out of the hospital. States that she is thankful to be at home and thankful they found what had caused her chest pain. She did ask about results of UA from her visit with the pcp on 09-17-2022. She is having dark and very cloudy, foul smelling urine when urinating. She was catheterized with a foley in the hospital the end of February. Message received from the clinical staff that the patient is coming by the office tomorrow am to repeat a UA. Education and support provided. Will continue to monitor for new changes or concerns. Advised patient to call [facility] at [facility] for assistance with help getting her husbands medication due to being unemployed with no insurance. [phone] and ask for [place] Adie Provided education to patient re: resources and help available to assist the patient with needs at this time due to her husband being unemployed Reviewed  medications with patient and discussed compliance. The patient is compliant with medications and works with the pharm D for her medication needs. Denies any issues with non-compliance Collaborated with pharm D regarding [facility] assistance for the patients husband due to no insurance and being unemployed. Provided information to the patient by a return call to the patient.  Provided patient with care guide and SW support as well as support from the interdisciplinary team for management of stress, anxiety, and depression  educational materials related to effective management of stress, anxiety, and depression. The patient is actively working with the LCSW for ongoing support and education. Reviewed scheduled/upcoming provider appointments including 12-17-2022 at 730 am Care Guide referral for community resources for [facility] for food insecurities and assistance with utilities. Has information from the care guides  Social Work referral for education and support and resources for effective management of stress at this time as her husband is out of work and she is concerned about financial issues Discussed plans with patient for ongoing care management follow up and provided patient with direct contact information for care management team Advised patient to discuss changes in mood, increased anxiety, depression, stress,  or other mental health needs.  with provider Screening for signs and symptoms of depression related to chronic disease state  Assessed social determinant of health barriers  Symptom Management: Take medications as prescribed   Attend all scheduled provider appointments Call provider office for new concerns or questions  call the Suicide and Crisis Lifeline: 988 call the [place] National Suicide Prevention Lifeline: (859)439-4369 or TTY: (740)308-9123 TTY 936 865 8895) to talk to a trained counselor call 1-800-273-TALK (toll free, 24 hour hotline) if experiencing a Mental  Health or [place]   Follow Up Plan: Telephone follow up appointment with care management team member scheduled for: 12-05-2022 at 230 pm       CCM Expected Outcome:  Monitor, Self-Manage, and Reduce Symptoms of Hypertension       Current Barriers:  Chronic Disease Management support and education needs related to effective management of HTN BP Readings from Last 3 Encounters:  09/24/22 (!) 117/46  09/21/22 92/64  09/17/22 134/62     Planned Interventions: Evaluation of current treatment plan related to hypertension self management and patient's adherence to plan as established by provider. The patient states her blood pressures are stable at this time. She does take at home and records. Sees specialist on a regular basis. Was having CP on 09-07-2022 and went to the hospital to be evaluated. She had a heart catheterization and had stenting of her LAD due to a 90% LAD. The patient is feeling much better. She had went to the hospital 3 weeks before that with the same symptoms but they told her it was an exacerbation of her HF. A cardiac PA was in the ER and listened to her story and she was able to get cath and be discharged home. She is doing well and thankful for the care she received.  ;   Provided education to patient re: stroke prevention, s/s of heart attack and stroke. The patient has high risk factors for HA and stroke. Had 2 heart attacks in 2022. States that she had taken NTG with minimal relief. Reflective listening. Praised for being proactive in her care and seeking help; Reviewed prescribed diet heart healthy/ADA diet. Review and education  Reviewed medications with patient and discussed importance of compliance. The patient states compliance with medications. Denies any medication needs at this time.;  Discussed plans with patient for ongoing care management follow up and provided patient with direct contact information for care management team; Advised patient,  providing education and rationale, to monitor blood pressure daily and record, calling PCP for findings outside established parameters. The patient states her blood pressures systolic are around [id number] and diastolic about 63 to 66;  Reviewed scheduled/upcoming provider appointments including: 12-17-2022 at 730 am Advised patient to discuss changes in her blood pressures or heart health  with provider; Provided education on prescribed diet heart healthy/ADA diet. The patient is working with DSS currently hopeful to get food stamps due to her husband being out of work. Also talks to the LCSW on a regular basis;  Discussed complications of poorly controlled blood pressure such as heart disease, stroke, circulatory complications, vision complications, kidney impairment, sexual dysfunction;  Screening for signs and symptoms of depression related to chronic disease state;  Assessed social determinant of health barriers;   Symptom Management: Take medications as prescribed   Attend all scheduled provider appointments Call provider office for new concerns or questions  call the Suicide and Crisis Lifeline: 988 call the [place] National Suicide Prevention Lifeline: 310 828 9379 or TTY:  380-720-9916 TTY 719-439-3112) to talk to a trained counselor call 1-800-273-TALK (toll free, 24 hour hotline) if experiencing a Mental Health or [place]  check blood pressure 3 times per week write blood pressure results in a log or diary learn about high blood pressure keep a blood pressure log take blood pressure log to all doctor appointments call doctor for signs and symptoms of high blood pressure develop an action plan for high blood pressure keep all doctor appointments take medications for blood pressure exactly as prescribed report new symptoms to your doctor  Follow Up Plan: Telephone follow up appointment with care management team member scheduled for: 12-05-2022 at 230 pm           Patient verbalizes understanding of instructions and care plan provided today and agrees to view in [place]. Active MyChart status and patient understanding of how to access instructions and care plan via MyChart confirmed with patient.     Telephone follow up appointment with care management team member scheduled for: 12-05-2022 at 230 pm  Mindfulness-Based Stress Reduction Mindfulness-based stress reduction (MBSR) is a program that helps people learn to practice mindfulness. Mindfulness is the practice of consciously paying attention to the present moment. MBSR focuses on developing self-awareness, which lets you respond to life stress without judgment or negative feelings. It can be learned and practiced through techniques such as education, breathing exercises, meditation, and yoga. MBSR includes several mindfulness techniques in one program. MBSR works best when you understand the treatment, are willing to try new things, and can commit to spending time practicing what you learn. MBSR training may include learning about: How your feelings, thoughts, and reactions affect your body. New ways to respond to things that cause negative thoughts to start (triggers). How to notice your thoughts and let go of them. Practicing awareness of everyday things that you normally do without thinking. The techniques and goals of different types of meditation. What are the benefits of MBSR? MBSR can have many benefits, which include helping you to: Develop self-awareness. This means knowing and understanding yourself. Learn skills and attitudes that help you to take part in your own health care. Learn new ways to care for yourself. Be more accepting about how things are, and let things go. Be less judgmental and approach things with an open mind. Be patient with yourself and trust yourself more. MBSR has also been shown to: Reduce negative emotions, such as sadness, overwhelm, and worry. Improve memory  and focus. Change how you sense and react to pain. Boost your body's ability to fight infections. Help you connect better with other people. Improve your sense of well-being. How to practice mindfulness To do a basic awareness exercise: Find a comfortable place to sit. Pay attention to the present moment. Notice your thoughts, feelings, and surroundings just as they are. Avoid judging yourself, your feelings, or your surroundings. Make note of any judgment that comes up and let it go. Your mind may wander, and that is okay. Make note of when your thoughts drift, and return your attention to the present moment. To do basic mindfulness meditation: Find a comfortable place to sit. This may include a stable chair or a firm floor cushion. Sit upright with your back straight. Let your arms fall next to your sides, with your hands resting on your legs. If you are sitting in a chair, rest your feet flat on the floor. If you are sitting on a cushion, cross your legs in front of you.  Keep your head in a neutral position with your chin dropped slightly. Relax your jaw and rest the tip of your tongue on the roof of your mouth. Drop your gaze to the floor or close your eyes. Breathe normally and pay attention to your breath. Feel the air moving in and out of your nose. Feel your belly expanding and relaxing with each breath. Your mind may wander, and that is okay. Make note of when your thoughts drift, and return your attention to your breath. Avoid judging yourself, your feelings, or your surroundings. Make note of any judgment or feelings that come up, let them go, and bring your attention back to your breath. When you are ready, lift your gaze or open your eyes. Pay attention to how your body feels after the meditation. Follow these instructions at home:  Find a local in-person or online MBSR program. Set aside some time regularly for mindfulness practice. Practice every day if you can. Even 10 minutes  of practice is helpful. Find a mindfulness practice that works best for you. This may include one or more of the following: Meditation. This involves focusing your mind on a certain thought or activity. Breathing awareness exercises. These help you to stay present by focusing on your breath. Body scan. For this practice, you lie down and pay attention to each part of your body from head to toe. You can identify tension and soreness and consciously relax parts of your body. Yoga. Yoga involves stretching and breathing, and it can improve your ability to move and be flexible. It can also help you to test your body's limits, which can help you release stress. Mindful eating. This way of eating involves focusing on the taste, texture, color, and smell of each bite of food. This slows down eating and helps you feel full sooner. For this reason, it can be an important part of a weight loss plan. Find a podcast or recording that provides guidance for breathing awareness, body scan, or meditation exercises. You can listen to these any time when you have a free moment to rest without distractions. Follow your treatment plan as told by your health care provider. This may include taking regular medicines and making changes to your diet or lifestyle as recommended. Where to find more information You can find more information about MBSR from: Your health care provider. Community-based meditation centers or programs. Programs offered near you. Summary Mindfulness-based stress reduction (MBSR) is a program that teaches you how to consciously pay attention to the present moment. It is used to help you deal better with daily stress, feelings, and pain. MBSR focuses on developing self-awareness, which allows you to respond to life stress without judgment or negative feelings. MBSR programs may involve learning different mindfulness practices, such as breathing exercises, meditation, yoga, body scan, or mindful  eating. Find a mindfulness practice that works best for you, and set aside time for it on a regular basis. This information is not intended to replace advice given to you by your health care provider. Make sure you discuss any questions you have with your health care provider. Document Revised: 02/09/2021 Document Reviewed: 02/09/2021 Elsevier Patient Education  [place]. Urinary Tract Infection, Adult A urinary tract infection (UTI) is an infection of any part of the urinary tract. The urinary tract includes: The kidneys. The ureters. The bladder. The urethra. These organs make, store, and get rid of pee (urine) in the body. What are the causes? This infection is caused by  germs (bacteria) in your genital area. These germs grow and cause swelling (inflammation) of your urinary tract. What increases the risk? The following factors may make you more likely to develop this condition: Using a small, thin tube (catheter) to drain pee. Not being able to control when you pee or poop (incontinence). Being female. If you are female, these things can increase the risk: Using these methods to prevent pregnancy: A medicine that kills sperm (spermicide). A device that blocks sperm (diaphragm). Having low levels of a female hormone (estrogen). Being pregnant. You are more likely to develop this condition if: You have genes that add to your risk. You are sexually active. You take antibiotic medicines. You have trouble peeing because of: A prostate that is bigger than normal, if you are female. A blockage in the part of your body that drains pee from the bladder. A kidney stone. A nerve condition that affects your bladder. Not getting enough to drink. Not peeing often enough. You have other conditions, such as: Diabetes. A weak disease-fighting system (immune system). Sickle cell disease. Gout. Injury of the spine. What are the signs or symptoms? Symptoms of this condition  include: Needing to pee right away. Peeing small amounts often. Pain or burning when peeing. Blood in the pee. Pee that smells bad or not like normal. Trouble peeing. Pee that is cloudy. Fluid coming from the vagina, if you are female. Pain in the belly or lower back. Other symptoms include: Vomiting. Not feeling hungry. Feeling mixed up (confused). This may be the first symptom in older adults. Being tired and grouchy (irritable). A fever. Watery poop (diarrhea). How is this treated? Taking antibiotic medicine. Taking other medicines. Drinking enough water. In some cases, you may need to see a specialist. Follow these instructions at home:  Medicines Take over-the-counter and prescription medicines only as told by your doctor. If you were prescribed an antibiotic medicine, take it as told by your doctor. Do not stop taking it even if you start to feel better. General instructions Make sure you: Pee until your bladder is empty. Do not hold pee for a long time. Empty your bladder after sex. Wipe from front to back after peeing or pooping if you are a female. Use each tissue one time when you wipe. Drink enough fluid to keep your pee pale yellow. Keep all follow-up visits. Contact a doctor if: You do not get better after 1-2 days. Your symptoms go away and then come back. Get help right away if: You have very bad back pain. You have very bad pain in your lower belly. You have a fever. You have chills. You feeling like you will vomit or you vomit. Summary A urinary tract infection (UTI) is an infection of any part of the urinary tract. This condition is caused by germs in your genital area. There are many risk factors for a UTI. Treatment includes antibiotic medicines. Drink enough fluid to keep your pee pale yellow. This information is not intended to replace advice given to you by your health care provider. Make sure you discuss any questions you have with your health  care provider. Document Revised: 02/12/2020 Document Reviewed: 02/12/2020 Elsevier Patient Education  [place]. Hemoglobin A1C Test Why am I having this test? You may have the hemoglobin A1C test (A1C test) done to: Check your risk for developing diabetes. Diagnose diabetes. Check the long-term control of blood sugar (glucose) in people who have diabetes and help make treatment decisions. This test may  be done with other blood glucose tests, such as fasting blood glucose and oral glucose tolerance tests. What is being tested? Hemoglobin is a type of protein in the blood that carries oxygen. Glucose attaches to hemoglobin to form glycated hemoglobin. This test checks the amount of glycated hemoglobin in your blood. This is a good indicator of the average amount of glucose in your blood during the past 2-3 months. What kind of sample is taken?  A blood sample is required for this test. It is usually collected by inserting a needle into a blood vessel. Tell a health care provider about: All medicines you are taking, including vitamins, herbs, eye drops, creams, and over-the-counter medicines. Any blood disorders you have. Any surgeries you have had. Any medical conditions you have. Whether you are pregnant or may be pregnant. How are the results reported? Your results will be reported as a percentage indicating how much of your hemoglobin has glucose attached to it. Your health care provider will compare your results to normal ranges established after testing a large group of people (reference ranges). Reference ranges may vary among labs and hospitals. For this test, common reference ranges are: Adult or child without diabetes: 4-5.6%. Adult or child with prediabetes: 5.7%-6.4%. Adult or child with diabetes: 6.5% or higher. What do the results mean? If you do not have diabetes: A result within the reference range means that you are not at high risk for diabetes. A result of  5.7-6.4% means that you have a high risk of developing diabetes, and you have prediabetes. Having prediabetes puts you at risk for developing type 2 diabetes. You may have more tests, including a repeat A1C test. Results of 6.5% or higher on two separate A1C tests mean that you have diabetes. You may have more tests to confirm the diagnosis. If you have diabetes: A result of 7% usually means that your diabetes is under control. A result of less than 7% also means that diabetes is under control. This result may be an appropriate goal for those for whom there is a low risk of low blood sugar (hypoglycemia). A result of less than 8% may also mean that diabetes is under control. This result may be an appropriate goal for those who do not benefit from more blood glucose control or who are at high risk for hypoglycemia. Abnormally low A1C values may be caused by: Pregnancy. Severe blood loss. Receiving donated blood (transfusions). Low red blood cell count (anemia). Long-term kidney failure. Some unusual forms (variants) of hemoglobin. Talk with your health care provider about what your results mean. Questions to ask your health care provider Ask your health care provider, or the department that is doing the test: When will my results be ready? How will I get my results? What are my treatment options? What other tests do I need? What are my next steps? Summary The A1C test is done to check your risk for developing diabetes, diagnose diabetes, and check the long-term control of blood sugar (glucose) in people who have diabetes and help make treatment decisions. Hemoglobin is a type of protein in the blood that carries oxygen. Glucose attaches to hemoglobin to form glycated hemoglobin. This test checks the amount of glycated hemoglobin in your blood. Talk with your health care provider about what your results mean. This information is not intended to replace advice given to you by your health care  provider. Make sure you discuss any questions you have with your health care provider. Document Revised:  02/06/2022 Document Reviewed: 08/16/2021 Elsevier Patient Education  [place].

## 2022-09-26 NOTE — Telephone Encounter (Signed)
   CCM RN Visit Note   09-26-2022 Name: Dana [name] MRN: [id number]      DOB: 06-17-1961  Subjective: Dana [name] is a 62 y.o. year old female who is a primary care patient of Dr. [name]. The patient was referred to the Chronic Care Management team for assistance with care management needs subsequent to provider initiation of CCM services and plan of care.      An unsuccessful telephone outreach was attempted today to contact the patient about Chronic Care Management needs.  The mailbox was full and the [facility] was unable to leave a message.  Plan:The care management team will reach out to the patient again over the next 30 days.  Noreene Larsson RN, MSN, CCM RN Care Manager  Chronic Care Management Direct Number: [phone]

## 2022-09-27 ENCOUNTER — Encounter: Payer: Self-pay | Admitting: Family Medicine

## 2022-09-27 ENCOUNTER — Ambulatory Visit (INDEPENDENT_AMBULATORY_CARE_PROVIDER_SITE_OTHER): Payer: PPO

## 2022-09-27 ENCOUNTER — Other Ambulatory Visit: Payer: Self-pay | Admitting: Family Medicine

## 2022-09-27 DIAGNOSIS — N3000 Acute cystitis without hematuria: Secondary | ICD-10-CM | POA: Diagnosis not present

## 2022-09-27 LAB — POCT URINALYSIS DIP (CLINITEK)
Bilirubin, UA: NEGATIVE
Blood, UA: NEGATIVE
Glucose, UA: NEGATIVE mg/dL
Ketones, POC UA: NEGATIVE mg/dL
Nitrite, UA: NEGATIVE
Spec Grav, UA: 1.02 (ref 1.010–1.025)
Urobilinogen, UA: 0.2 E.U./dL
pH, UA: 6 (ref 5.0–8.0)

## 2022-09-27 MED ORDER — CIPROFLOXACIN HCL 250 MG PO TABS: 250.0000 mg | ORAL_TABLET | Freq: Every day | ORAL | 0 refills | Status: AC

## 2022-09-27 NOTE — Progress Notes (Signed)
Patient presented for repeat UA. C/o of urinary frequency and some urinary discomfort (feels like pins when she finishes peeing)  Aware a axbx will be sent to pharmacy and should it need to be changed over the weekend when the urine culture results she will receive a mychart message. Patient verbalized understanding.

## 2022-09-30 ENCOUNTER — Encounter: Payer: Self-pay | Admitting: Oncology

## 2022-09-30 DIAGNOSIS — N184 Chronic kidney disease, stage 4 (severe): Secondary | ICD-10-CM | POA: Insufficient documentation

## 2022-09-30 LAB — URINE CULTURE

## 2022-09-30 NOTE — Assessment & Plan Note (Signed)
Follow-up with nephrology. 

## 2022-09-30 NOTE — Assessment & Plan Note (Signed)
The current medical regimen is effective;  continue present plan and medications. Management per specialist.   

## 2022-09-30 NOTE — Assessment & Plan Note (Signed)
>>  ASSESSMENT AND PLAN FOR DIABETIC NEPHROPATHY ASSOCIATED WITH TYPE 2 DIABETES MELLITUS (HCC) WRITTEN ON 09/30/2022  9:00 PM BY COX, KIRSTEN, MD  The current medical regimen is effective;  continue present plan and medications. Management per specialist.

## 2022-09-30 NOTE — Assessment & Plan Note (Signed)
Check cbc 

## 2022-10-02 ENCOUNTER — Inpatient Hospital Stay: Payer: PPO

## 2022-10-02 DIAGNOSIS — N1832 Chronic kidney disease, stage 3b: Secondary | ICD-10-CM | POA: Diagnosis not present

## 2022-10-02 DIAGNOSIS — F411 Generalized anxiety disorder: Secondary | ICD-10-CM | POA: Diagnosis not present

## 2022-10-02 LAB — CBC WITH DIFFERENTIAL (CANCER CENTER ONLY)
Abs Immature Granulocytes: 0.05 10*3/uL (ref 0.00–0.07)
Basophils Absolute: 0.1 10*3/uL (ref 0.0–0.1)
Basophils Relative: 1 %
Eosinophils Absolute: 0.3 10*3/uL (ref 0.0–0.5)
Eosinophils Relative: 3 %
HCT: 31.4 % — ABNORMAL LOW (ref 36.0–46.0)
Hemoglobin: 9.6 g/dL — ABNORMAL LOW (ref 12.0–15.0)
Immature Granulocytes: 0 %
Lymphocytes Relative: 18 %
Lymphs Abs: 2.1 10*3/uL (ref 0.7–4.0)
MCH: 27.2 pg (ref 26.0–34.0)
MCHC: 30.6 g/dL (ref 30.0–36.0)
MCV: 89 fL (ref 80.0–100.0)
Monocytes Absolute: 0.7 10*3/uL (ref 0.1–1.0)
Monocytes Relative: 6 %
Neutro Abs: 8.6 10*3/uL — ABNORMAL HIGH (ref 1.7–7.7)
Neutrophils Relative %: 72 %
Platelet Count: 254 10*3/uL (ref 150–400)
RBC: 3.53 MIL/uL — ABNORMAL LOW (ref 3.87–5.11)
RDW: 16.2 % — ABNORMAL HIGH (ref 11.5–15.5)
WBC Count: 11.8 10*3/uL — ABNORMAL HIGH (ref 4.0–10.5)
nRBC: 0 % (ref 0.0–0.2)

## 2022-10-02 LAB — VITAMIN B12: Vitamin B-12: 473 pg/mL (ref 180–914)

## 2022-10-02 LAB — IRON AND TIBC
Iron: 34 ug/dL (ref 28–170)
Saturation Ratios: 12 % (ref 10.4–31.8)
TIBC: 279 ug/dL (ref 250–450)
UIBC: 245 ug/dL

## 2022-10-02 LAB — FERRITIN: Ferritin: 93 ng/mL (ref 11–307)

## 2022-10-02 LAB — FOLATE: Folate: 10.6 ng/mL (ref >5.9)

## 2022-10-04 ENCOUNTER — Ambulatory Visit: Payer: PPO

## 2022-10-06 ENCOUNTER — Other Ambulatory Visit: Payer: Self-pay | Admitting: Family Medicine

## 2022-10-06 DIAGNOSIS — F411 Generalized anxiety disorder: Secondary | ICD-10-CM

## 2022-10-08 ENCOUNTER — Other Ambulatory Visit: Payer: Self-pay | Admitting: *Deleted

## 2022-10-08 ENCOUNTER — Ambulatory Visit: Payer: PPO

## 2022-10-08 ENCOUNTER — Telehealth: Payer: Self-pay

## 2022-10-08 ENCOUNTER — Other Ambulatory Visit: Payer: Self-pay

## 2022-10-08 DIAGNOSIS — F411 Generalized anxiety disorder: Secondary | ICD-10-CM

## 2022-10-08 NOTE — Progress Notes (Unsigned)
Care Management & Coordination Services Pharmacy Team  Reason for Encounter: Medication coordination and delivery  Contacted patient to discuss medications and coordinate delivery from [facility]. {US HC Outreach:28874} Cycle dispensing form sent to *** for review.   Last adherence delivery date: 07-18-2022    Patient is due for next adherence delivery on: 10-18-2022  This delivery to include: Vials  90 Days  Allopurinol 300mg  1 tab daily Novolog Flexpen Inject 2-10 units before meals Buspirone 5mg - 1 tab three times daily Carvedilol 12.5mg - 1 tab twice daily  Clopidogrel 75mg - 1 tab at bedtime Ezetimibe 10mg - 1 tab once daily Hydralazine 25mg - 1 tab three times daily  Isosorbide Mono 30mg - 1 tab once daily  Pantoprazole 40mg - 1 tab once daily  Potassium Chloride Er 76meq- 1 tab once daily  Torsemide 20mg - 4 tabs once daily  Aspirin 81 mg daily Repatha 140mg - Inject 140mg  every 14 days  Linzess 145 mcg daily  Patient declined the following medications this month: ***  Refills requested from providers include: Novolog Flexpen  Buspirone  Zetia- sent to specialist by [facility]  {Delivery BK:1911189   Any concerns about your medications? {yes/no:20286}  How often do you forget or accidentally miss a dose? {Missed doses:25554}  Do you use a pillbox? {yes/no:20286}  Is patient in packaging {yes/no:20286}  If yes  What is the date on your next pill pack?  Any concerns or issues with your packaging?   Recent blood pressure readings are as follows:***  Recent blood glucose readings are as follows:***   Chart review: Recent office visits:  09-27-2022 Arsenio Katz, CMA. Abnormal UA. START Cipro 250 mg daily for 7 days.   09-26-2022 Vanita Ingles, RN (CCM).   08-31-2022 CoxElnita Maxwell, MD. WBC= 10.9, RBC= 3.68, Hemo= 10.0, Hema= 31.2. A1C= 7.4. START linzess 145 mcg daily.  08-21-2022 Katha Cabal, LCSW (CC).  08-01-2022 Katha Cabal, LCSW (CC).  07-25-2022 Vanita Ingles, RN (CCM).   Recent consult visits:  09-24-2022 Quentin Mulling, RN (Oncology). Epoetin alfa injection given.  09-21-2022 Rafael Bihari, FNP (Cardiology). EKG completed.  09-07-2022 Martinique, Peter M, MD (Cardiology). Left heart cath procedure completed.  08-30-2022 Bensimhon, Shaune Pascal, MD (Cardiology). B Natriuretic Peptide= 103.7. Glucose= 123, BUN= 70, Creatinine= 1.88, eGFR= 30.  08-27-2022 Bartell, Herbert Seta, RN (Oncology). Epoetin alfa injection given.  08-16-2022 Biagio Quint, MD (Nephrology). No changes follow up in 3 months.  08-14-2022 Bo Merino, MD (Rheumatology). Visit for pain in both hands.  08-13-2022 Emmaline Life, RN (Oncology). Epoetin alfa injection given.  08-09-2022 Payton Mccallum, RN (Cardiology). B Natriuretic Peptide= 101.0. Glucose= 136, BUN= 68, Creatinine= 2.18, Calcium= 8.5, eGFR= 25. START metolazone 2.5 mg PRN  07-26-2022 Emmit Alexanders, RN (Oncology). Epoetin alfa injection given.  07-19-2022 Mosher, Thalia Bloodgood, PA-C (Oncology). Follow up visit no changes.  07-18-2022 Tommie Raymond, NP (Cardiology). Follow up visit no changes.  07-12-2022 Daryel November, LPN (Oncology). Epoetin alfa injection given.  Hospital visits:  Medication Reconciliation was completed by comparing discharge summary, patient's EMR and Pharmacy list, and upon discussion with patient.  Admitted to the hospital on 08-07-2022 due to CHF. Discharge date was 08-07-2022. Discharged from [facility] ED at [place]?Medications Started at [facility] Discharge:?? None  Medication Changes at Hospital Discharge: None  Medications Discontinued at Hospital Discharge: None  Medications that remain the same after Hospital Discharge:??  -All other medications will remain the same.    Medications: Outpatient  Encounter Medications as of 10/08/2022  Medication Sig   allopurinol (ZYLOPRIM) 300 MG tablet  Take 1 tablet (300 mg total) by mouth daily.   aspirin EC 81 MG tablet Take 1 tablet (81 mg total) by mouth daily. Swallow whole.   busPIRone (BUSPAR) 5 MG tablet Take 1 tablet (5 mg total) by mouth 3 (three) times daily.   carvedilol (COREG) 12.5 MG tablet TAKE ONE TABLET BY MOUTH TWICE DAILY (Patient taking differently: Take 12.5 mg by mouth 2 (two) times daily with a meal.)   clopidogrel (PLAVIX) 75 MG tablet Take 1 tablet (75 mg total) by mouth daily.   Continuous Blood Gluc Sensor (FREESTYLE LIBRE 2 SENSOR) MISC USE TO check blood glucose AS DIRECTED AND CHANGE sensor every 14 DAYS   CRANBERRY PO Take 2 tablets by mouth daily.   EPINEPHrine 0.3 mg/0.3 mL IJ SOAJ injection Use as directed for life-threatening allergic reaction.   Evolocumab (REPATHA SURECLICK) [id number] MG/ML SOAJ Inject 1 Dose into the skin every 14 (fourteen) days.   ezetimibe (ZETIA) 10 MG tablet Take 1 tablet (10 mg total) by mouth daily.   hydrALAZINE (APRESOLINE) 25 MG tablet Take 1 tablet (25 mg total) by mouth 3 (three) times daily.   HYDROcodone-acetaminophen (NORCO) 7.5-325 MG tablet TAKE ONE TABLET BY MOUTH three times daily AS NEEDED   insulin aspart (NOVOLOG FLEXPEN) 100 UNIT/ML FlexPen 2-10 U before meals based on SSI. (Patient taking differently: Inject 2-10 Units into the skin 3 (three) times daily with meals. Per sliding scale)   Insulin Pen Needle (BD PEN NEEDLE NANO 2ND GEN) 32G X 4 MM MISC Inject 1 each into the skin in the morning, at noon, in the evening, and at bedtime.   isosorbide mononitrate (IMDUR) 30 MG 24 hr tablet Take 1 tablet (30 mg total) by mouth daily.   latanoprost (XALATAN) 0.005 % ophthalmic solution Place 1 drop into both eyes at bedtime.   LEVEMIR FLEXPEN 100 UNIT/ML FlexPen Inject 50 units into THE SKIN daily (Patient taking differently: Inject 50 Units into the skin daily.)   linaclotide (LINZESS) 145 MCG CAPS capsule Take 1 capsule (145 mcg total) by mouth daily before breakfast.   LORazepam  (ATIVAN) 0.5 MG tablet TAKE ONE TABLET BY MOUTH ONCE daily AS NEEDED FOR ANXIETY   magnesium oxide (MAG-OX) 400 MG tablet Take 2 tablets (800 mg total) by mouth daily. Take 2 (400 mg) daily=800 mg   metolazone (ZAROXOLYN) 2.5 MG tablet Take 1 tablet (2.5 mg total) by mouth as needed.   nitroGLYCERIN (NITROSTAT) 0.4 MG SL tablet Dissolve 1 tab under tongue as needed for chest pain. May repeat every 5 minutes x 2 doses. If no relief call 9-1-1. Please call [phone] to schedule an appointment for future refills. Thank you.   Omega-3 Fatty Acids (FISH OIL PO) Take 1 capsule by mouth daily.   OXYGEN Inhale 2 L into the lungs at bedtime.   pantoprazole (PROTONIX) 40 MG tablet Take 1 tablet (40 mg total) by mouth daily.   potassium chloride SA (KLOR-CON M) 20 MEQ tablet TAKE ONE TABLET BY MOUTH ONCE daily   Probiotic CAPS Take 1 capsule by mouth daily.   promethazine (PHENERGAN) 25 MG tablet TAKE ONE TABLET BY MOUTH daily AS NEEDED   torsemide (DEMADEX) 20 MG tablet TAKE 4 TABLETS BY MOUTH ONCE DAILY   No facility-administered encounter medications on file as of 10/08/2022.   BP Readings from Last 3 Encounters:  09/24/22 (!) 117/46  09/21/22 92/64  09/17/22 134/62  Pulse Readings from Last 3 Encounters:  09/24/22 74  09/21/22 70  09/17/22 73    Lab Results  Component Value Date/Time   HGBA1C 7.4 (H) 08/31/2022 08:14 AM   HGBA1C 8.2 (H) 01/19/2022 08:05 AM   Lab Results  Component Value Date   CREATININE 1.89 (H) 09/17/2022   BUN 50 (H) 09/17/2022   GFRNONAA 29 (L) 09/08/2022   GFRAA 43 (L) 06/28/2020   NA 142 09/17/2022   K 4.2 09/17/2022   CALCIUM 9.4 09/17/2022   CO2 23 09/17/2022   10-08-2022: 1st attempt VM Full  [facility] Pharmacist Assistant [phone]

## 2022-10-09 ENCOUNTER — Encounter: Payer: Self-pay | Admitting: Oncology

## 2022-10-09 ENCOUNTER — Inpatient Hospital Stay: Payer: PPO

## 2022-10-09 ENCOUNTER — Ambulatory Visit: Payer: PPO | Admitting: Oncology

## 2022-10-09 ENCOUNTER — Inpatient Hospital Stay (INDEPENDENT_AMBULATORY_CARE_PROVIDER_SITE_OTHER): Payer: PPO | Admitting: Oncology

## 2022-10-09 ENCOUNTER — Ambulatory Visit: Payer: PPO

## 2022-10-09 ENCOUNTER — Other Ambulatory Visit: Payer: PPO

## 2022-10-09 VITALS — BP 157/54 | HR 76 | Temp 97.7°F | Resp 20 | Ht 64.5 in | Wt 235.1 lb

## 2022-10-09 VITALS — BP 158/59 | HR 76 | Temp 97.7°F | Resp 20 | Ht 64.5 in | Wt 235.0 lb

## 2022-10-09 DIAGNOSIS — D72829 Elevated white blood cell count, unspecified: Secondary | ICD-10-CM | POA: Diagnosis not present

## 2022-10-09 DIAGNOSIS — D631 Anemia in chronic kidney disease: Secondary | ICD-10-CM

## 2022-10-09 DIAGNOSIS — N189 Chronic kidney disease, unspecified: Secondary | ICD-10-CM

## 2022-10-09 DIAGNOSIS — I5030 Unspecified diastolic (congestive) heart failure: Secondary | ICD-10-CM | POA: Diagnosis not present

## 2022-10-09 DIAGNOSIS — N184 Chronic kidney disease, stage 4 (severe): Secondary | ICD-10-CM

## 2022-10-09 DIAGNOSIS — N1832 Chronic kidney disease, stage 3b: Secondary | ICD-10-CM | POA: Diagnosis not present

## 2022-10-09 MED ORDER — EPOETIN ALFA-EPBX 20000 UNIT/ML IJ SOLN
20000.0000 [IU] | Freq: Once | INTRAMUSCULAR | Status: AC
  Administered 2022-10-09: 20000 [IU] via SUBCUTANEOUS
  Filled 2022-10-09: qty 1

## 2022-10-09 MED ORDER — BUSPIRONE HCL 5 MG PO TABS: 5.0000 mg | ORAL_TABLET | Freq: Three times a day (TID) | ORAL | 1 refills | Status: DC

## 2022-10-09 MED ORDER — NOVOLOG FLEXPEN 100 UNIT/ML ~~LOC~~ SOPN: PEN_INJECTOR | SUBCUTANEOUS | 3 refills | Status: DC

## 2022-10-09 NOTE — Progress Notes (Signed)
[facility]  [address] [place],  [place]  [address] [phone]  Clinic Day:  10/09/2022  Referring physician: [name], MD  ASSESSMENT & PLAN:   Assessment & Plan: Anemia secondary to stage IV CKD: -Proceed with Retacrit every 2 weeks for hemoglobin 10 or less.  -Reviewed lab work.  Hemoglobin 9.6 (9.9).  -Return to clinic every 2 weeks for lab work and possible Retacrit and in 3 months for f/u with MD/NP.   IDA: - Ferritin trending down and iron saturation 12%. May need additional IV iron in the near future.  B12 and folate WNL. -Received 5 doses of IV Venofer in May 2023.  -Unable to tolerate oral iron due to GI upset.  Discussed iron rich foods.  Leukocytosis: -Unclear etiology -Stable and will continue to monitor. -WBC is 11.8 today.  CHF: -Follow-up with cardiology.   Disposition- -Retacrit today. -RTC every 2 weeks for lab work and possible Retacrit injection and in 3 months for lab work, MD/NP assessment and possible Retacrit.  The patient understands the plans discussed today and is in agreement with them.  She knows to contact our office if she develops concerns prior to her next appointment.   I provided 20 minutes of face-to-face time during this encounter and > 50% was spent counseling as documented under my assessment and plan.    Jacquelin Hawking, NP  [facility] AT [facility] [address] [place] [address] Dept: [phone] Dept Fax: [phone]   No orders of the defined types were placed in this encounter.     CHIEF COMPLAINT:  CC: Anemia of chronic kidney disease  Current Treatment: Epoetin every 2 weeks  HISTORY OF PRESENT ILLNESS:  Dana [name] is a 62 y.o. female with a complicated medical history, including stage IV chronic kidney disease.  She has had chronic anemia since 2010.  She had worsening anemia, so was given a dose of Retacrit  in December.  She then was then found to have a low iron saturation of 11% with a TIBC of 271 and ferritin 106 in February, and gave a history of allergic reaction to IV iron, so was referred here.  She states she broke out in severe hives after receiving IV iron in the past 1 to 2 years.  She has had blood transfusion on several occasions, most recently in December 2022.  She has a history of hypoxic brain injury in June 2022, so has difficulty with dates. In 2017, when she reported rectal bleeding, as well as black tarry stools, EGD and colonoscopy were recommended.  EGD in September 2017 revealed a pyloric erosion.  We were unable to access the colonoscopy report from September 2017.    She was given IV Feraheme in February 2022 when admitted for acute on chronic diastolic heart failure.  She reported severe generalized itching after receiving the first dose of Feraheme.  No hives were noted by the examining provider.  The 2nd dose of Feraheme was not given.  She was transfused in February 2022, when her hemoglobin was 7.8 and again in March 2022 when her hemoglobin was 7.  She was given 5 doses of IV Venofer from 12/12/2021-12/25/2021 for ferritin of 34 without side effects.  Ferritin improved to 141 in September.   Due to her family history of malignancy, she underwent hereditary cancer genetic testing in April 2023, which did not reveal any pathogenic variants or variants of unknown  significance in [facility] [employer] +RNAinsight Panel.   She was hospitalized for chest pain on 09/07/2022 and found to have 2 vessels obstructive CAD with 90% stenosis in proximal LAD.  Had cardiac cath  with stent placement in the distal RCA and was sent home on aspirin and Plavix.  Previous stent placed in LCx was occluded and was treated medically.   INTERVAL HISTORY:  Dana [name] is here today for repeat clinical assessment.  She reports feeling  extremely fatigued since she was hospitalized.  Fatigue has not worsened  but has not improved either.  Has shortness of breath with exertion.  Denies any bleeding.  Has occasional diarrhea.  Denies fevers or chills.  Reports stable chronic low back pain and uses a cane for ambulation.  Appetite is stable.  Weight is stable.  She has been referred to outpatient cardiac rehab but has  not heard from them yet.  Denies any recent chest pain.   REVIEW OF SYSTEMS:  Review of Systems  Constitutional:  Positive for fatigue.  Cardiovascular:  Negative for chest pain.  Gastrointestinal:  Positive for diarrhea (Occasional). Negative for abdominal pain, nausea and vomiting.  Musculoskeletal:  Positive for back pain. Negative for myalgias.  Neurological:  Negative for headaches.     VITALS:  There were no vitals taken for this visit.  Wt Readings from Last 3 Encounters:  09/24/22 234 lb (106.1 kg)  09/21/22 234 lb (106.1 kg)  09/17/22 234 lb (106.1 kg)    There is no height or weight on file to calculate BMI.  Performance status (ECOG): 1 - Symptomatic but completely ambulatory  PHYSICAL EXAM:  Physical Exam Vitals reviewed.  Constitutional:      General: She is not in acute distress. Cardiovascular:     Rate and Rhythm: Normal rate and regular rhythm.     Heart sounds: Normal heart sounds.  Pulmonary:     Effort: Pulmonary effort is normal.     Breath sounds: Normal breath sounds.  Abdominal:     Palpations: Abdomen is soft.  Musculoskeletal:     Right lower leg: 1+ Edema present.     Left lower leg: 1+ Edema present.  Neurological:     Mental Status: She is alert.     LABS:      Latest Ref Rng & Units 10/02/2022   10:44 AM 09/17/2022   12:06 PM 09/08/2022    2:07 AM  CBC  WBC 4.0 - 10.5 K/uL 11.8  11.8  18.0   Hemoglobin 12.0 - 15.0 g/dL 9.6  9.9  10.4   Hematocrit 36.0 - 46.0 % 31.4  32.2  32.8   Platelets 150 - 400 K/uL 254  319  251       Latest Ref Rng & Units 09/17/2022   12:06 PM 09/08/2022    2:07 AM 09/07/2022    8:10 PM  CMP  Glucose 70  - 99 mg/dL 132  172    BUN 6 - 24 mg/dL 50  57    Creatinine 0.57 - 1.00 mg/dL 1.89  1.94  1.69   Sodium 134 - 144 mmol/L 142  139    Potassium 3.5 - 5.2 mmol/L 4.2  4.3    Chloride 96 - 106 mmol/L 100  107    CO2 20 - 29 mmol/L 23  22    Calcium 8.7 - 10.2 mg/dL 9.4  8.8    Total Protein 6.0 - 8.5 g/dL 7.7     Total Bilirubin 0.0 - 1.2  mg/dL 0.3     Alkaline Phos 44 - 121 IU/L 188     AST 0 - 40 IU/L 13     ALT 0 - 32 IU/L 14        No results found for: "CEA1", "CEA" / No results found for: "CEA1", "CEA" No results found for: "PSA1" No results found for: "[id number]" No results found for: "CAN125"  Lab Results  Component Value Date   TOTALPROTELP 7.3 09/13/2021   ALBUMINELP 3.5 09/13/2021   A1GS 0.3 09/13/2021   A2GS 0.8 09/13/2021   BETS 1.0 09/13/2021   GAMS 1.8 09/13/2021   MSPIKE Not Observed 09/13/2021   Lab Results  Component Value Date   TIBC 279 10/02/2022   TIBC 262 07/19/2022   TIBC 263 03/29/2022   FERRITIN 93 10/02/2022   FERRITIN 108 07/19/2022   FERRITIN 141 03/29/2022   IRONPCTSAT 12 10/02/2022   IRONPCTSAT 13 07/19/2022   IRONPCTSAT 20 03/29/2022   Lab Results  Component Value Date   LDH 176 09/13/2021    STUDIES:  No results found.    HISTORY:   Past Medical History:  Diagnosis Date   Acquired absence of left great toe (HCC)    Acquired absence of other left toe(s) ([place])    ACS (acute coronary syndrome) ([place])    Anemia    Anoxic brain injury ([place])    Anxiety    CAD (coronary artery disease)    DES mLAD 04/07/15; DES dRCA & DES pPDA 08/30/16; DES mCX & PTCA OM1 09/29/20   Chronic diastolic (congestive) heart failure (HCC)    Chronic pain    Chronic systolic (congestive) heart failure (HCC)    CKD (chronic kidney disease)    Contrast dye induced nephropathy, possible 09/03/2020   COPD (chronic obstructive pulmonary disease) (HCC)    Diabetes (HCC)    Diastolic CHF (HCC)    Dyspnea    Family history of breast cancer 10/23/2021    Gastro-esophageal reflux disease without esophagitis    Hyperlipidemia    Hypertension    Hypertensive heart disease with heart failure (HCC)    Hypoxemia    Idiopathic gout, multiple sites    Leukocytosis 03/29/2022   Localized edema    Low back pain    Mitral regurgitation    Mitral regurgitation    Mixed hyperlipidemia    Mixed incontinence    Morbid (severe) obesity due to excess calories (HCC)    Neuromuscular disorder (HCC)    Neuropathy    Non-ST elevation (NSTEMI) myocardial infarction ([place])    08/29/20, 09/29/20   OSA (obstructive sleep apnea) 09/03/2020   Other specified hypothyroidism    PAF (paroxysmal atrial fibrillation) (HCC)    s/p pulmonary vein isolation by cryoablation 10/04/15 Dr. Minna Merritts in [facility]   PEA (Pulseless electrical activity) [facility])    ~ 01/13/21 at [facility] during admission DKA, volume overload, possible CAP, hypoxia s/p ACLS with ROSC ~ 10-15   Pneumonia    Primary insomnia    Rheumatoid arthritis ([place])    Stroke ([place])    Thyroid disease    Type 2 diabetes mellitus with diabetic nephropathy ([place])    Vitamin D deficiency     Past Surgical History:  Procedure Laterality Date   ABDOMINAL HYSTERECTOMY     Partial- Still has both ovaries   CARDIOVASCULAR STRESS TEST  08/13/2014   Nuclear; Normal   CARPAL TUNNEL RELEASE     CATARACT EXTRACTION, BILATERAL     CESAREAN SECTION  CHOLECYSTECTOMY     CORONARY ANGIOPLASTY WITH STENT PLACEMENT     3 blockages/ 1 stent   CORONARY STENT INTERVENTION N/A 09/29/2020   Procedure: CORONARY STENT INTERVENTION;  Surgeon: Sherren Mocha, MD;  Location: [place] CV LAB;  Service: Cardiovascular;  Laterality: N/A;  CFX   CORONARY STENT INTERVENTION N/A 09/07/2022   Procedure: CORONARY STENT INTERVENTION;  Surgeon: Martinique, Peter M, MD;  Location: [place] CV LAB;  Service: Cardiovascular;  Laterality: N/A;   EYE SURGERY     INTRAVASCULAR PRESSURE WIRE/FFR STUDY N/A 09/07/2022   Procedure:  INTRAVASCULAR PRESSURE WIRE/FFR STUDY;  Surgeon: Martinique, Peter M, MD;  Location: [facility] CV LAB;  Service: Cardiovascular;  Laterality: N/A;   LEFT HEART CATH AND CORONARY ANGIOGRAPHY N/A 09/29/2020   Procedure: LEFT HEART CATH AND CORONARY ANGIOGRAPHY;  Surgeon: Sherren Mocha, MD;  Location: [place] CV LAB;  Service: Cardiovascular;  Laterality: N/A;   LEFT HEART CATH AND CORONARY ANGIOGRAPHY N/A 09/07/2022   Procedure: LEFT HEART CATH AND CORONARY ANGIOGRAPHY;  Surgeon: Martinique, Peter M, MD;  Location: [place] CV LAB;  Service: Cardiovascular;  Laterality: N/A;   MITRAL VALVE REPAIR N/A 05/18/2021   Procedure: MITRAL VALVE REPAIR;  Surgeon: Early Osmond, MD;  Location: [place] CV LAB;  Service: Cardiovascular;  Laterality: N/A;   RIGHT HEART CATH N/A 04/27/2021   Procedure: RIGHT HEART CATH;  Surgeon: Jolaine Artist, MD;  Location: [place] CV LAB;  Service: Cardiovascular;  Laterality: N/A;   RIGHT/LEFT HEART CATH AND CORONARY ANGIOGRAPHY N/A 01/07/2017   Procedure: Right/Left Heart Cath and Coronary Angiography;  Surgeon: Larey Dresser, MD;  Location: [place] CV LAB;  Service: Cardiovascular;  Laterality: N/A;   ROTATOR CUFF REPAIR     TEE WITHOUT CARDIOVERSION N/A 04/28/2021   Procedure: TRANSESOPHAGEAL ECHOCARDIOGRAM (TEE);  Surgeon: Jolaine Artist, MD;  Location: [facility] ENDOSCOPY;  Service: Cardiovascular;  Laterality: N/A;   TEE WITHOUT CARDIOVERSION N/A 05/18/2021   Procedure: TRANSESOPHAGEAL ECHOCARDIOGRAM (TEE);  Surgeon: Early Osmond, MD;  Location: [place] CV LAB;  Service: Cardiovascular;  Laterality: N/A;   TOE AMPUTATION Left    US ECHOCARDIOGRAPHY  05/2017   Normal    Family History  Problem Relation Age of Onset   Breast cancer Mother 20       contralateral breast ca dx 39   Coronary artery disease Mother    Hypertension Mother    Hyperlipidemia Mother    Diabetes type II Mother    Lung cancer Mother 12   Diabetes Father     Hypertension Father    Mental illness Sister    Bipolar disorder Other    Depression Other    Schizophrenia Other    Cancer Other        MGF's sisters x2; unknown type    Social History:  reports that she quit smoking about 40 years ago. Her smoking use included cigarettes. She started smoking about 41 years ago. She has been exposed to tobacco smoke. She has never used smokeless tobacco. She reports that she does not drink alcohol and does not use drugs.The patient is alone today.  Allergies:  Allergies  Allergen Reactions   Naproxen Hives and Rash   Celecoxib Other (See Comments)    Stomach pain   Aspirin Other (See Comments)    CAN NOT TAKE DUE TO ULCERS (documented previously) but takes every day currently (as of 09/29/20) without reported issues Can take coated aspirin   Glucosamine Hives, Swelling  and Other (See Comments)    Angioedema    Iron     IV iron transfusion - hives - Feb 2022 hospitalization   Metoclopramide Other (See Comments)    Facial twitching and stuttering   Metolazone Other (See Comments)    Acute renal failure   Other Hives    Strawberry allergy   Shellfish-Derived Products Hives, Swelling and Other (See Comments)    Angioedema    Statins Other (See Comments)    Muscle pain   Strawberry (Diagnostic)    Strawberry Extract    Strawberry Flavor    Strawberry Leaves Extract    Tramadol Nausea And Vomiting         Current Medications: Current Outpatient Medications  Medication Sig Dispense Refill   allopurinol (ZYLOPRIM) 300 MG tablet Take 1 tablet (300 mg total) by mouth daily. 90 tablet 1   aspirin EC 81 MG tablet Take 1 tablet (81 mg total) by mouth daily. Swallow whole. 30 tablet 12   busPIRone (BUSPAR) 5 MG tablet TAKE ONE TABLET BY MOUTH three times daily 270 tablet 1   busPIRone (BUSPAR) 5 MG tablet Take 1 tablet (5 mg total) by mouth 3 (three) times daily. 270 tablet 1   carvedilol (COREG) 12.5 MG tablet TAKE ONE TABLET BY MOUTH TWICE  DAILY (Patient taking differently: Take 12.5 mg by mouth 2 (two) times daily with a meal.) 180 tablet 3   clopidogrel (PLAVIX) 75 MG tablet Take 1 tablet (75 mg total) by mouth daily. 90 tablet 1   Continuous Blood Gluc Sensor (FREESTYLE LIBRE 2 SENSOR) MISC USE TO check blood glucose AS DIRECTED AND CHANGE sensor every 14 DAYS     CRANBERRY PO Take 2 tablets by mouth daily.     EPINEPHrine 0.3 mg/0.3 mL IJ SOAJ injection Use as directed for life-threatening allergic reaction. 4 Device 3   Evolocumab (REPATHA SURECLICK) [id number] MG/ML SOAJ Inject 1 Dose into the skin every 14 (fourteen) days. 6 mL 1   ezetimibe (ZETIA) 10 MG tablet Take 1 tablet (10 mg total) by mouth daily. 90 tablet 3   hydrALAZINE (APRESOLINE) 25 MG tablet Take 1 tablet (25 mg total) by mouth 3 (three) times daily. 90 tablet 8   HYDROcodone-acetaminophen (NORCO) 7.5-325 MG tablet TAKE ONE TABLET BY MOUTH three times daily AS NEEDED 90 tablet 0   insulin aspart (NOVOLOG FLEXPEN) 100 UNIT/ML FlexPen 2-10 U before meals based on SSI. 15 mL 3   Insulin Pen Needle (BD PEN NEEDLE NANO 2ND GEN) 32G X 4 MM MISC Inject 1 each into the skin in the morning, at noon, in the evening, and at bedtime. 600 each 3   isosorbide mononitrate (IMDUR) 30 MG 24 hr tablet Take 1 tablet (30 mg total) by mouth daily. 90 tablet 1   latanoprost (XALATAN) 0.005 % ophthalmic solution Place 1 drop into both eyes at bedtime.     LEVEMIR FLEXPEN 100 UNIT/ML FlexPen Inject 50 units into THE SKIN daily (Patient taking differently: Inject 50 Units into the skin daily.) 15 mL 1   linaclotide (LINZESS) 145 MCG CAPS capsule Take 1 capsule (145 mcg total) by mouth daily before breakfast. 90 capsule 1   LORazepam (ATIVAN) 0.5 MG tablet TAKE ONE TABLET BY MOUTH ONCE daily AS NEEDED FOR ANXIETY 30 tablet 2   magnesium oxide (MAG-OX) 400 MG tablet Take 2 tablets (800 mg total) by mouth daily. Take 2 (400 mg) daily=800 mg 60 tablet 1   metolazone (ZAROXOLYN) 2.5 MG tablet Take  1  tablet (2.5 mg total) by mouth as needed. 3 tablet 0   nitroGLYCERIN (NITROSTAT) 0.4 MG SL tablet Dissolve 1 tab under tongue as needed for chest pain. May repeat every 5 minutes x 2 doses. If no relief call 9-1-1. Please call [phone] to schedule an appointment for future refills. Thank you. 50 tablet 0   Omega-3 Fatty Acids (FISH OIL PO) Take 1 capsule by mouth daily.     OXYGEN Inhale 2 L into the lungs at bedtime.     pantoprazole (PROTONIX) 40 MG tablet Take 1 tablet (40 mg total) by mouth daily. 90 tablet 1   potassium chloride SA (KLOR-CON M) 20 MEQ tablet TAKE ONE TABLET BY MOUTH ONCE daily 90 tablet 1   Probiotic CAPS Take 1 capsule by mouth daily.     promethazine (PHENERGAN) 25 MG tablet TAKE ONE TABLET BY MOUTH daily AS NEEDED 30 tablet 3   torsemide (DEMADEX) 20 MG tablet TAKE 4 TABLETS BY MOUTH ONCE DAILY 336 tablet 3   No current facility-administered medications for this visit.

## 2022-10-10 ENCOUNTER — Telehealth (HOSPITAL_COMMUNITY): Payer: Self-pay | Admitting: Adult Health

## 2022-10-10 NOTE — Telephone Encounter (Signed)
Called to ask to send a cardiomems reading.   Feels ok. Weight trending up 235 pounds. Weight up 4 pounds.   Instructed to take an extra 20 mg of torsemide x 2 days.   Jahrell Hamor NP-C  1:30 PM

## 2022-10-11 ENCOUNTER — Other Ambulatory Visit: Payer: PPO

## 2022-10-11 ENCOUNTER — Ambulatory Visit: Payer: PPO | Admitting: Hematology and Oncology

## 2022-10-14 DIAGNOSIS — Z794 Long term (current) use of insulin: Secondary | ICD-10-CM

## 2022-10-14 DIAGNOSIS — E1159 Type 2 diabetes mellitus with other circulatory complications: Secondary | ICD-10-CM

## 2022-10-14 DIAGNOSIS — I10 Essential (primary) hypertension: Secondary | ICD-10-CM

## 2022-10-14 DIAGNOSIS — F32A Depression, unspecified: Secondary | ICD-10-CM | POA: Diagnosis not present

## 2022-10-16 DIAGNOSIS — F411 Generalized anxiety disorder: Secondary | ICD-10-CM | POA: Diagnosis not present

## 2022-10-21 ENCOUNTER — Other Ambulatory Visit: Payer: Self-pay | Admitting: Family Medicine

## 2022-10-22 ENCOUNTER — Other Ambulatory Visit: Payer: Self-pay

## 2022-10-22 MED ORDER — PROMETHAZINE HCL 25 MG PO TABS: 25.0000 mg | ORAL_TABLET | Freq: Every day | ORAL | 3 refills | Status: DC | PRN

## 2022-10-23 ENCOUNTER — Other Ambulatory Visit: Payer: PPO

## 2022-10-25 ENCOUNTER — Ambulatory Visit: Payer: PPO

## 2022-10-30 ENCOUNTER — Ambulatory Visit: Payer: Self-pay | Admitting: Licensed Clinical Social Worker

## 2022-10-30 ENCOUNTER — Other Ambulatory Visit: Payer: Self-pay | Admitting: Family Medicine

## 2022-10-30 ENCOUNTER — Inpatient Hospital Stay: Payer: PPO | Attending: Oncology

## 2022-10-30 DIAGNOSIS — F411 Generalized anxiety disorder: Secondary | ICD-10-CM | POA: Diagnosis not present

## 2022-10-30 DIAGNOSIS — D631 Anemia in chronic kidney disease: Secondary | ICD-10-CM | POA: Diagnosis not present

## 2022-10-30 DIAGNOSIS — N184 Chronic kidney disease, stage 4 (severe): Secondary | ICD-10-CM | POA: Insufficient documentation

## 2022-10-30 DIAGNOSIS — N1832 Chronic kidney disease, stage 3b: Secondary | ICD-10-CM

## 2022-10-30 LAB — CBC WITH DIFFERENTIAL (CANCER CENTER ONLY)
Abs Immature Granulocytes: 0.03 10*3/uL (ref 0.00–0.07)
Basophils Absolute: 0 10*3/uL (ref 0.0–0.1)
Basophils Relative: 0 %
Eosinophils Absolute: 0.3 10*3/uL (ref 0.0–0.5)
Eosinophils Relative: 3 %
HCT: 31.6 % — ABNORMAL LOW (ref 36.0–46.0)
Hemoglobin: 9.7 g/dL — ABNORMAL LOW (ref 12.0–15.0)
Immature Granulocytes: 0 %
Lymphocytes Relative: 20 %
Lymphs Abs: 2.5 10*3/uL (ref 0.7–4.0)
MCH: 26.7 pg (ref 26.0–34.0)
MCHC: 30.7 g/dL (ref 30.0–36.0)
MCV: 87.1 fL (ref 80.0–100.0)
Monocytes Absolute: 0.7 10*3/uL (ref 0.1–1.0)
Monocytes Relative: 6 %
Neutro Abs: 8.7 10*3/uL — ABNORMAL HIGH (ref 1.7–7.7)
Neutrophils Relative %: 71 %
Platelet Count: 265 10*3/uL (ref 150–400)
RBC: 3.63 MIL/uL — ABNORMAL LOW (ref 3.87–5.11)
RDW: 15.4 % (ref 11.5–15.5)
WBC Count: 12.4 10*3/uL — ABNORMAL HIGH (ref 4.0–10.5)
nRBC: 0 % (ref 0.0–0.2)

## 2022-10-30 NOTE — Patient Outreach (Signed)
  Care Coordination   Follow Up Visit Note   10/30/2022 Name: Dana [name] MRN: [id number] DOB: Jan 03, 1961  Dana [name] is a 62 y.o. year old female who sees Cox, Kirsten, MD for primary care. I spoke with  Dana [name] by phone today.  What matters to the patients health and wellness today?  Patient has anxiety over financial needs. She   has walking challenges    Goals Addressed             This Visit's Progress    Patient Stated she has anxiety over financial needs. she has walking challenges       Interventions:  LCSW spoke with client about client needs Discussed transport needs. She drives short distances only. Her spouse, [name], helps with other transport needs for client Discussed breathing challenges. She uses oxygen at night to help her breath Discussed financial challenges. Her spouse plans to start job in next 2 weeks.  Discussed pain issues. She has walking challenges and some pain in walking. She has some joint pain occasionally Provided counseling support for client Discussed client support with rheumatologist . Discussed client management of pain issues Discussed dexterity in picking up glass liquid. She sometimes has pain in fingers. She takes prescribed pain medication. Client spoke of support with nephrologist. Client spoke of vision needs. She wears glasses Discussed sleeping issues. She has OSA. She wears oxygen at night to help with breathing Encouraged client to call LCSW as needed at [phone] Client spoke of her support with psychologist. She feels that this support is very helpful. She is taking medications as prescribed     SDOH assessments and interventions completed:  Yes  SDOH Interventions Today    Flowsheet Row Most Recent Value  SDOH Interventions   Depression Interventions/Treatment  Medication, Currently on Treatment  Financial Strain Interventions Other (Comment)  [difficulty paying monthly bills, difficulty making car payment]   Physical Activity Interventions Other (Comments)  [client has some walking challenges]  Stress Interventions Other (Comment)  [stress issues related to PTSD,  stress in managing medical needs]        Care Coordination Interventions:  Yes, provided   Interventions Today    Flowsheet Row Most Recent Value  Chronic Disease   Chronic disease during today's visit Other  [spoke via phone with client about client needs]  General Interventions   General Interventions Discussed/Reviewed General Interventions Discussed, [employer] program support . Discussed financial challenges of client. discussed job prospects of her spouse, [name]]  Exercise Interventions   Exercise Discussed/Reviewed Physical Activity  Education Interventions   Education Provided Provided Education  Provided Verbal Education On Community Resources  Mental Health Interventions   Mental Health Discussed/Reviewed Anxiety, Coping Strategies  [discussed coping skills to manage anxiety issues. Client sees psychologist for mental health support]  Nutrition Interventions   Nutrition Discussed/Reviewed Nutrition Discussed  Safety Interventions   Safety Discussed/Reviewed Fall Risk  [discussed fall risk]      Follow up plan: Follow up call scheduled for 12/17/22 at 11:00 AM    Encounter Outcome:  Pt. Visit Completed   Kelton Pillar.Eulan Heyward MSW, LCSW Licensed Visual merchandiser [facility] Care Management [phone]

## 2022-10-30 NOTE — Patient Instructions (Signed)
Visit Information  Thank you for taking time to visit with me today. Please don't hesitate to contact me if I can be of assistance to you.   Following are the goals we discussed today:   Goals Addressed             This Visit's Progress    Patient Stated she has anxiety over financial needs. she has walking challenges       Interventions:  LCSW spoke with client about client needs Discussed transport needs. She drives short distances only. Her spouse, [name], helps with other transport needs for client Discussed breathing challenges. She uses oxygen at night to help her breath Discussed financial challenges. Her spouse plans to start job in next 2 weeks.  Discussed pain issues. She has walking challenges and some pain in walking. She has some joint pain occasionally Provided counseling support for client Discussed client support with rheumatologist . Discussed client management of pain issues Discussed dexterity in picking up glass liquid. She sometimes has pain in fingers. She takes prescribed pain medication. Client spoke of support with nephrologist. Client spoke of vision needs. She wears glasses Discussed sleeping issues. She has OSA. She wears oxygen at night to help with breathing Encouraged client to call LCSW as needed at [phone] Client spoke of her support with psychologist. She feels that this support is very helpful. She is taking medications as prescribed     Our next appointment is by telephone on 12/17/22 at 11:00 AM   Please call the care guide team at [phone] if you need to cancel or reschedule your appointment.   If you are experiencing a Mental Health or Behavioral Health Crisis or need someone to talk to, please go to [facility] [address] [place] [phone])   The patient verbalized understanding of instructions, educational materials, and care plan provided today and DECLINED offer to receive copy of  patient instructions, educational materials, and care plan.   The patient has been provided with contact information for the care management team and has been advised to call with any health related questions or concerns.   Kelton Pillar.Alantra Popoca MSW, LCSW Licensed Visual merchandiser [facility] Care Management [phone]

## 2022-10-31 ENCOUNTER — Encounter: Payer: Self-pay | Admitting: Oncology

## 2022-11-01 ENCOUNTER — Inpatient Hospital Stay: Payer: PPO

## 2022-11-01 VITALS — BP 126/54 | HR 70 | Temp 97.8°F | Resp 18 | Ht 64.5 in | Wt 235.0 lb

## 2022-11-01 DIAGNOSIS — N184 Chronic kidney disease, stage 4 (severe): Secondary | ICD-10-CM | POA: Diagnosis not present

## 2022-11-01 DIAGNOSIS — D631 Anemia in chronic kidney disease: Secondary | ICD-10-CM

## 2022-11-01 MED ORDER — EPOETIN ALFA-EPBX 20000 UNIT/ML IJ SOLN
20000.0000 [IU] | Freq: Once | INTRAMUSCULAR | Status: AC
  Administered 2022-11-01: 20000 [IU] via SUBCUTANEOUS
  Filled 2022-11-01: qty 1

## 2022-11-01 NOTE — Patient Instructions (Signed)

## 2022-11-05 ENCOUNTER — Other Ambulatory Visit: Payer: Self-pay | Admitting: Family Medicine

## 2022-11-05 ENCOUNTER — Other Ambulatory Visit: Payer: Self-pay

## 2022-11-05 MED ORDER — EZETIMIBE 10 MG PO TABS: 10.0000 mg | ORAL_TABLET | Freq: Every day | ORAL | 1 refills | Status: DC

## 2022-11-06 ENCOUNTER — Other Ambulatory Visit: Payer: PPO

## 2022-11-07 ENCOUNTER — Other Ambulatory Visit: Payer: Self-pay

## 2022-11-07 DIAGNOSIS — N184 Chronic kidney disease, stage 4 (severe): Secondary | ICD-10-CM

## 2022-11-08 ENCOUNTER — Ambulatory Visit: Payer: PPO

## 2022-11-08 ENCOUNTER — Other Ambulatory Visit: Payer: PPO

## 2022-11-08 DIAGNOSIS — N184 Chronic kidney disease, stage 4 (severe): Secondary | ICD-10-CM

## 2022-11-09 LAB — RENAL FUNCTION PANEL
Albumin: 3.8 g/dL (ref 3.8–4.9)
BUN/Creatinine Ratio: 40 — ABNORMAL HIGH (ref 9–23)
BUN: 76 mg/dL (ref 6–24)
CO2: 22 mmol/L (ref 20–29)
Calcium: 9.2 mg/dL (ref 8.7–10.2)
Chloride: 98 mmol/L (ref 96–106)
Creatinine, Ser: 1.89 mg/dL — ABNORMAL HIGH (ref 0.57–1.00)
Glucose: 197 mg/dL — ABNORMAL HIGH (ref 70–99)
Phosphorus: 4.7 mg/dL — ABNORMAL HIGH (ref 3.0–4.3)
Potassium: 4.4 mmol/L (ref 3.5–5.2)
Sodium: 138 mmol/L (ref 134–144)
eGFR: 30 mL/min/{1.73_m2} — ABNORMAL LOW (ref >59)

## 2022-11-13 DIAGNOSIS — F411 Generalized anxiety disorder: Secondary | ICD-10-CM | POA: Diagnosis not present

## 2022-11-14 DIAGNOSIS — Z515 Encounter for palliative care: Secondary | ICD-10-CM | POA: Diagnosis not present

## 2022-11-14 DIAGNOSIS — I69354 Hemiplegia and hemiparesis following cerebral infarction affecting left non-dominant side: Secondary | ICD-10-CM | POA: Diagnosis not present

## 2022-11-14 DIAGNOSIS — Z7902 Long term (current) use of antithrombotics/antiplatelets: Secondary | ICD-10-CM | POA: Diagnosis not present

## 2022-11-14 DIAGNOSIS — Z89422 Acquired absence of other left toe(s): Secondary | ICD-10-CM | POA: Diagnosis not present

## 2022-11-14 DIAGNOSIS — Z955 Presence of coronary angioplasty implant and graft: Secondary | ICD-10-CM | POA: Diagnosis not present

## 2022-11-14 DIAGNOSIS — Z89412 Acquired absence of left great toe: Secondary | ICD-10-CM | POA: Diagnosis not present

## 2022-11-14 DIAGNOSIS — I2510 Atherosclerotic heart disease of native coronary artery without angina pectoris: Secondary | ICD-10-CM | POA: Diagnosis not present

## 2022-11-14 DIAGNOSIS — Z6841 Body Mass Index (BMI) 40.0 and over, adult: Secondary | ICD-10-CM | POA: Diagnosis not present

## 2022-11-14 DIAGNOSIS — I10 Essential (primary) hypertension: Secondary | ICD-10-CM | POA: Diagnosis not present

## 2022-11-14 DIAGNOSIS — Z7982 Long term (current) use of aspirin: Secondary | ICD-10-CM | POA: Diagnosis not present

## 2022-11-15 ENCOUNTER — Inpatient Hospital Stay: Payer: PPO | Attending: Oncology

## 2022-11-15 ENCOUNTER — Inpatient Hospital Stay: Payer: PPO

## 2022-11-15 VITALS — BP 147/50 | HR 80 | Temp 97.4°F | Resp 18 | Ht 64.5 in | Wt 236.8 lb

## 2022-11-15 DIAGNOSIS — N1832 Chronic kidney disease, stage 3b: Secondary | ICD-10-CM | POA: Diagnosis not present

## 2022-11-15 DIAGNOSIS — D631 Anemia in chronic kidney disease: Secondary | ICD-10-CM | POA: Insufficient documentation

## 2022-11-15 DIAGNOSIS — E1022 Type 1 diabetes mellitus with diabetic chronic kidney disease: Secondary | ICD-10-CM | POA: Diagnosis not present

## 2022-11-15 DIAGNOSIS — N184 Chronic kidney disease, stage 4 (severe): Secondary | ICD-10-CM | POA: Diagnosis not present

## 2022-11-15 DIAGNOSIS — Z6841 Body Mass Index (BMI) 40.0 and over, adult: Secondary | ICD-10-CM | POA: Diagnosis not present

## 2022-11-15 DIAGNOSIS — I10 Essential (primary) hypertension: Secondary | ICD-10-CM | POA: Diagnosis not present

## 2022-11-15 MED ORDER — EPOETIN ALFA-EPBX 20000 UNIT/ML IJ SOLN
20000.0000 [IU] | Freq: Once | INTRAMUSCULAR | Status: AC
  Administered 2022-11-15: 20000 [IU] via SUBCUTANEOUS
  Filled 2022-11-15: qty 1

## 2022-11-15 NOTE — Patient Instructions (Signed)

## 2022-11-16 ENCOUNTER — Telehealth (HOSPITAL_COMMUNITY): Payer: Self-pay | Admitting: Adult Health

## 2022-11-16 NOTE — Telephone Encounter (Signed)
   Cardiomems Remote Monitoring  S/P Cardiomems Implant   PAD Goal: 10 Most recent reading: 11, 12,14    Recommended changes: Take  an extra 20 mg torsemide for the next 2 days.   I continue to review and analyze the patients PA pressures weekly (and more often as needed) to bring PA pressures within the optimal range.       I called Shatika to discuss.   Orvill Coulthard NP-C  3:29 PM

## 2022-11-19 DIAGNOSIS — E103593 Type 1 diabetes mellitus with proliferative diabetic retinopathy without macular edema, bilateral: Secondary | ICD-10-CM | POA: Diagnosis not present

## 2022-11-19 LAB — HM DIABETES EYE EXAM

## 2022-11-20 ENCOUNTER — Other Ambulatory Visit: Payer: PPO

## 2022-11-22 ENCOUNTER — Ambulatory Visit: Payer: PPO

## 2022-11-26 DIAGNOSIS — R3915 Urgency of urination: Secondary | ICD-10-CM | POA: Diagnosis not present

## 2022-11-26 DIAGNOSIS — N3020 Other chronic cystitis without hematuria: Secondary | ICD-10-CM | POA: Diagnosis not present

## 2022-11-27 ENCOUNTER — Inpatient Hospital Stay: Payer: PPO

## 2022-11-27 DIAGNOSIS — F411 Generalized anxiety disorder: Secondary | ICD-10-CM | POA: Diagnosis not present

## 2022-11-28 ENCOUNTER — Other Ambulatory Visit: Payer: Self-pay | Admitting: Family Medicine

## 2022-11-28 ENCOUNTER — Telehealth: Payer: Self-pay

## 2022-11-28 NOTE — Progress Notes (Signed)
Re-enrolling patient in [facility] for [place] assistance, enrollment ended 11/22/2022. Patient approved:  HE[employer]LL ID [id number]  REFERENCE NUMBER [id number]  Merel Laforte  FUND Hypercholesterolemia - Medicare Access  ASSISTANCE TYPE Co-pay  START DATE 11/23/2022  END DATE 11/22/2023   Sending pharmacy card information to [facility] for patient next delivery in June.   Billee Cashing, CMA Clinical Pharmacist Assistant [phone]

## 2022-11-29 ENCOUNTER — Inpatient Hospital Stay: Payer: PPO

## 2022-12-04 ENCOUNTER — Inpatient Hospital Stay: Payer: PPO

## 2022-12-04 ENCOUNTER — Other Ambulatory Visit: Payer: PPO

## 2022-12-04 DIAGNOSIS — D631 Anemia in chronic kidney disease: Secondary | ICD-10-CM

## 2022-12-04 DIAGNOSIS — I2510 Atherosclerotic heart disease of native coronary artery without angina pectoris: Secondary | ICD-10-CM | POA: Diagnosis not present

## 2022-12-04 DIAGNOSIS — I200 Unstable angina: Secondary | ICD-10-CM | POA: Diagnosis not present

## 2022-12-04 DIAGNOSIS — R0789 Other chest pain: Secondary | ICD-10-CM | POA: Diagnosis not present

## 2022-12-04 DIAGNOSIS — R079 Chest pain, unspecified: Secondary | ICD-10-CM | POA: Diagnosis not present

## 2022-12-04 DIAGNOSIS — T82855A Stenosis of coronary artery stent, initial encounter: Secondary | ICD-10-CM | POA: Diagnosis not present

## 2022-12-04 LAB — CBC WITH DIFFERENTIAL (CANCER CENTER ONLY)
Abs Immature Granulocytes: 0.04 10*3/uL (ref 0.00–0.07)
Basophils Absolute: 0.1 10*3/uL (ref 0.0–0.1)
Basophils Relative: 1 %
Eosinophils Absolute: 0.3 10*3/uL (ref 0.0–0.5)
Eosinophils Relative: 3 %
HCT: 31.6 % — ABNORMAL LOW (ref 36.0–46.0)
Hemoglobin: 9.7 g/dL — ABNORMAL LOW (ref 12.0–15.0)
Immature Granulocytes: 0 %
Lymphocytes Relative: 21 %
Lymphs Abs: 2.3 10*3/uL (ref 0.7–4.0)
MCH: 26.9 pg (ref 26.0–34.0)
MCHC: 30.7 g/dL (ref 30.0–36.0)
MCV: 87.8 fL (ref 80.0–100.0)
Monocytes Absolute: 0.7 10*3/uL (ref 0.1–1.0)
Monocytes Relative: 6 %
Neutro Abs: 7.6 10*3/uL (ref 1.7–7.7)
Neutrophils Relative %: 69 %
Platelet Count: 248 10*3/uL (ref 150–400)
RBC: 3.6 MIL/uL — ABNORMAL LOW (ref 3.87–5.11)
RDW: 15.2 % (ref 11.5–15.5)
WBC Count: 11 10*3/uL — ABNORMAL HIGH (ref 4.0–10.5)
nRBC: 0 % (ref 0.0–0.2)

## 2022-12-05 ENCOUNTER — Ambulatory Visit (INDEPENDENT_AMBULATORY_CARE_PROVIDER_SITE_OTHER): Payer: PPO

## 2022-12-05 ENCOUNTER — Telehealth: Payer: PPO

## 2022-12-05 ENCOUNTER — Encounter: Payer: Self-pay | Admitting: Oncology

## 2022-12-05 DIAGNOSIS — E1142 Type 2 diabetes mellitus with diabetic polyneuropathy: Secondary | ICD-10-CM

## 2022-12-05 DIAGNOSIS — I130 Hypertensive heart and chronic kidney disease with heart failure and stage 1 through stage 4 chronic kidney disease, or unspecified chronic kidney disease: Secondary | ICD-10-CM

## 2022-12-05 DIAGNOSIS — F411 Generalized anxiety disorder: Secondary | ICD-10-CM

## 2022-12-05 DIAGNOSIS — F439 Reaction to severe stress, unspecified: Secondary | ICD-10-CM

## 2022-12-05 DIAGNOSIS — F330 Major depressive disorder, recurrent, mild: Secondary | ICD-10-CM

## 2022-12-05 NOTE — Addendum Note (Signed)
Addended by: Domenic Schwab on: 12/05/2022 04:06 PM   Modules accepted: Orders

## 2022-12-05 NOTE — Patient Instructions (Signed)
Please call the care guide team at [phone] if you need to cancel or reschedule your appointment.   If you are experiencing a Mental Health or Behavioral Health Crisis or need someone to talk to, please call the Suicide and Crisis Lifeline: 988 call the [place] National Suicide Prevention Lifeline: 507-363-0730 or TTY: (864)127-2149 TTY 709-817-1372) to talk to a trained counselor call 1-800-273-TALK (toll free, 24 hour hotline) go to [facility] [address] [place] [phone])   Following is a copy of the CCM Program Consent:  CCM service includes personalized support from designated clinical staff supervised by the physician, including individualized plan of care and coordination with other care providers 24/7 contact phone numbers for assistance for urgent and routine care needs. Service will only be billed when office clinical staff spend 20 minutes or more in a month to coordinate care. Only one practitioner may furnish and bill the service in a calendar month. The patient may stop CCM services at amy time (effective at the end of the month) by phone call to the office staff. The patient will be responsible for cost sharing (co-pay) or up to 20% of the service fee (after annual deductible is met)  Following is a copy of your full provider care plan:   Goals Addressed             This Visit's Progress    CCM Expected Outcome:  Monitor, Self-Manage and Reduce Symptoms of Diabetes       Current Barriers:  Chronic Disease Management support and education needs related to effective management of DM Lab Results  Component Value Date   HGBA1C 7.4 (H) 08/31/2022     Planned Interventions: Provided education to patient about basic DM disease process. The patient states that her blood sugars are doing good. Denies any acute changes in her DM. The patient feels good and is stable at this time with her DM;  Reviewed medications with patient  and discussed importance of medication adherence. Patient states compliance with medications. The patient has her medications and denies any new medication needs;        Reviewed prescribed diet with patient heart healthy/ADA diet. Is compliant of heart healthy/ADA diet. The patient is mindful of what she eats and following dietary restrictions  ; Counseled on importance of regular laboratory monitoring as prescribed. Has regular lab work done. She has upcoming lab work. In basket message sent to the pcp to ask about if she would like her to come in next week to have labs done since she had to change her appointment to afternoon due to her husbands job. ;        Discussed plans with patient for ongoing care management follow up and provided patient with direct contact information for care management team;      Provided patient with written educational materials related to hypo and hyperglycemia and importance of correct treatment. States the lowest she has seen is 85 and the highest she has seen is 258. States that she has not seen very many highs or lows.;       Reviewed scheduled/upcoming provider appointments including: 12-17-2022 at 320 pm Advised patient, providing education and rationale, to check cbg twice daily and when you have symptoms of low or high blood sugar and record. States her blood sugars have actually been good.         call provider for findings outside established parameters;       Referral made to  community resources care guide team for assistance with food insecurities and resources in her area to help with expressed needs. Has talked with care guides and had resources mailed.  ;      Review of patient status, including review of consultants reports, relevant laboratory and other test results, and medications completed;       Advised patient to discuss changes in DM, questions, and concerns with provider;      Screening for signs and symptoms of depression related to chronic disease  state;        Assessed social determinant of health barriers;         Symptom Management: Take medications as prescribed   Attend all scheduled provider appointments Call provider office for new concerns or questions  call the Suicide and Crisis Lifeline: 988 call the [place] National Suicide Prevention Lifeline: 403 126 3577 or TTY: 902-852-9686 TTY (812)718-1916) to talk to a trained counselor call 1-800-273-TALK (toll free, 24 hour hotline) if experiencing a Mental Health or Behavioral Health Crisis  keep appointment with eye doctor check feet daily for cuts, sores or redness trim toenails straight across manage portion size wash and dry feet carefully every day wear comfortable, cotton socks wear comfortable, well-fitting shoes  Follow Up Plan: Telephone follow up appointment with care management team member scheduled for: 01-30-2023 at 230 pm       CCM Expected Outcome:  Monitor, Self-Manage and Reduce Symptoms of: Depression, Anxiety, Stress       Current Barriers:  Knowledge Deficits related to resources available to help with expressed needs at this time due to her husband losing his job and his unemployment has expired Care Coordination needs related to resources to help meet financial needs at this time in a patient with stress, anxiety, and depression  Chronic Disease Management support and education needs related to effective management of stress, anxiety, and depression Financial Constraints.   Planned Interventions: Evaluation of current treatment plan related to depression, stress, and anxiety and patient's adherence to plan as established by provider. The patient is doing well today. She states that her husband is working at [employer] and this is helping a lot. She states that he still has not started his new job with [employer] yet. It is now slated to start in December of this year. She says that he is happy at [facility] and if he chooses to stay there to work then that is  fine. She just wants him to be happy. She says she is staying in and denies any acute changes in her depression, anxiety, stress, or mental health concerns at this time.  Advised patient to call [facility] at [facility] for assistance with help getting her husbands medication due to being unemployed with no insurance. [phone] and ask for Milagros Reap Provided education to patient re: resources and help available to assist the patient with needs at this time due to her husband being unemployed. The patient states her husband is working now and she is very thankful for this.  Reviewed medications with patient and discussed compliance. The patient is compliant with medications and works with the pharm D for her medication needs. Denies any issues with non-compliance Collaborated with pharm D regarding [facility] assistance for the patients husband due to no insurance and being unemployed. Provided information to the patient by a return call to the patient.  Provided patient with care guide and SW support as well as support from the interdisciplinary team for management of stress, anxiety, and  depression  educational materials related to effective management of stress, anxiety, and depression. The patient is actively working with the LCSW for ongoing support and education. Reviewed scheduled/upcoming provider appointments including 12-17-2022 at 730 am Care Guide referral for community resources for [facility] for food insecurities and assistance with utilities. Has information from the care guides  Social Work referral for education and support and resources for effective management of stress, anxiety, and depression. The patient continues to work with the SW for assistance, education, and support.  Discussed plans with patient for ongoing care management follow up and provided patient with direct contact information for care management team Advised patient to discuss changes in mood,  increased anxiety, depression, stress, or other mental health needs.  with provider Screening for signs and symptoms of depression related to chronic disease state  Assessed social determinant of health barriers  Symptom Management: Take medications as prescribed   Attend all scheduled provider appointments Call provider office for new concerns or questions  call the Suicide and Crisis Lifeline: 988 call the [place] National Suicide Prevention Lifeline: 9340685191 or TTY: (802) 783-9116 TTY [phone]) to talk to a trained counselor call 1-800-273-TALK (toll free, 24 hour hotline) if experiencing a Mental Health or Behavioral Health Crisis   Follow Up Plan: Telephone follow up appointment with care management team member scheduled for: 01-30-2023 at 230 pm       CCM Expected Outcome:  Monitor, Self-Manage, and Reduce Symptoms of Hypertension       Current Barriers:  Chronic Disease Management support and education needs related to effective management of HTN BP Readings from Last 3 Encounters:  11/15/22 (!) 147/50  11/01/22 (!) 126/54  10/09/22 (!) 158/59     Planned Interventions: Evaluation of current treatment plan related to hypertension self management and patient's adherence to plan as established by provider. The patient states her blood pressures have been fluctuating some. She does take at home and records. Sees specialist on a regular basis. She denies any new issues related to her hospital stay in February and denies any new sx and sx at this time.    Provided education to patient re: stroke prevention, s/s of heart attack and stroke. The patient has high risk factors for HA and stroke. Had 2 heart attacks in 2022. States that she had taken NTG with minimal relief. Reflective listening. Praised for being proactive in her care and seeking help; Reviewed prescribed diet heart healthy/ADA diet. Review and education  Reviewed medications with patient and discussed importance of  compliance. The patient states compliance with medications. Denies any medication needs at this time.;  Discussed plans with patient for ongoing care management follow up and provided patient with direct contact information for care management team; Advised patient, providing education and rationale, to monitor blood pressure daily and record, calling PCP for findings outside established parameters. The patient states her blood pressures systolic are around 120's and diastolic about 63 to 66;  Reviewed scheduled/upcoming provider appointments including: 12-17-2022 at 320 pm Advised patient to discuss changes in her blood pressures or heart health  with provider; Provided education on prescribed diet heart healthy/ADA diet. The patient is working with DSS currently hopeful to get food stamps due to her husband being out of work. Also talks to the LCSW on a regular basis;  Discussed complications of poorly controlled blood pressure such as heart disease, stroke, circulatory complications, vision complications, kidney impairment, sexual dysfunction;  Screening for signs and symptoms of depression related to chronic disease  state;  Assessed social determinant of health barriers;   Symptom Management: Take medications as prescribed   Attend all scheduled provider appointments Call provider office for new concerns or questions  call the Suicide and Crisis Lifeline: 988 call the [place] National Suicide Prevention Lifeline: (647)040-2542 or TTY: 4152996723 TTY 619-883-1344) to talk to a trained counselor call 1-800-273-TALK (toll free, 24 hour hotline) if experiencing a Mental Health or Behavioral Health Crisis  check blood pressure 3 times per week write blood pressure results in a log or diary learn about high blood pressure keep a blood pressure log take blood pressure log to all doctor appointments call doctor for signs and symptoms of high blood pressure develop an action plan for high blood  pressure keep all doctor appointments take medications for blood pressure exactly as prescribed report new symptoms to your doctor  Follow Up Plan: Telephone follow up appointment with care management team member scheduled for: 01-30-2023 at 230 pm          Patient verbalizes understanding of instructions and care plan provided today and agrees to view in MyChart. Active MyChart status and patient understanding of how to access instructions and care plan via MyChart confirmed with patient.  Telephone follow up appointment with care management team member scheduled for: 01-30-2023 at 230 pm

## 2022-12-05 NOTE — Chronic Care Management (AMB) (Signed)
Chronic Care Management   CCM RN Visit Note  12/05/2022 Name: Dana [name] MRN: [id number] DOB: 1960-11-11  Subjective: Dana [name] is a 62 y.o. year old female who is a primary care patient of Cox, Kirsten, MD. The patient was referred to the Chronic Care Management team for assistance with care management needs subsequent to provider initiation of CCM services and plan of care.    Today's Visit:  Engaged with patient by telephone for follow up visit.        Goals Addressed             This Visit's Progress    CCM Expected Outcome:  Monitor, Self-Manage and Reduce Symptoms of Diabetes       Current Barriers:  Chronic Disease Management support and education needs related to effective management of DM Lab Results  Component Value Date   HGBA1C 7.4 (H) 08/31/2022     Planned Interventions: Provided education to patient about basic DM disease process. The patient states that her blood sugars are doing good. Denies any acute changes in her DM. The patient feels good and is stable at this time with her DM;  Reviewed medications with patient and discussed importance of medication adherence. Patient states compliance with medications. The patient has her medications and denies any new medication needs;        Reviewed prescribed diet with patient heart healthy/ADA diet. Is compliant of heart healthy/ADA diet. The patient is mindful of what she eats and following dietary restrictions  ; Counseled on importance of regular laboratory monitoring as prescribed. Has regular lab work done. She has upcoming lab work. In basket message sent to the pcp to ask about if she would like her to come in next week to have labs done since she had to change her appointment to afternoon due to her husbands job. ;        Discussed plans with patient for ongoing care management follow up and provided patient with direct contact information for care management team;      Provided patient with written  educational materials related to hypo and hyperglycemia and importance of correct treatment. States the lowest she has seen is 12 and the highest she has seen is 258. States that she has not seen very many highs or lows.;       Reviewed scheduled/upcoming provider appointments including: 12-17-2022 at 320 pm Advised patient, providing education and rationale, to check cbg twice daily and when you have symptoms of low or high blood sugar and record. States her blood sugars have actually been good.         call provider for findings outside established parameters;       Referral made to community resources care guide team for assistance with food insecurities and resources in her area to help with expressed needs. Has talked with care guides and had resources mailed.  ;      Review of patient status, including review of consultants reports, relevant laboratory and other test results, and medications completed;       Advised patient to discuss changes in DM, questions, and concerns with provider;      Screening for signs and symptoms of depression related to chronic disease state;        Assessed social determinant of health barriers;         Symptom Management: Take medications as prescribed   Attend all scheduled provider appointments Call provider office for new concerns or questions  call the Suicide and Crisis Lifeline: 988 call the [place] National Suicide Prevention Lifeline: (774)166-7993 or TTY: 201 248 7977 TTY 520-428-3694) to talk to a trained counselor call 1-800-273-TALK (toll free, 24 hour hotline) if experiencing a Mental Health or Behavioral Health Crisis  keep appointment with eye doctor check feet daily for cuts, sores or redness trim toenails straight across manage portion size wash and dry feet carefully every day wear comfortable, cotton socks wear comfortable, well-fitting shoes  Follow Up Plan: Telephone follow up appointment with care management team member scheduled for:  01-30-2023 at 230 pm       CCM Expected Outcome:  Monitor, Self-Manage and Reduce Symptoms of: Depression, Anxiety, Stress       Current Barriers:  Knowledge Deficits related to resources available to help with expressed needs at this time due to her husband losing his job and his unemployment has expired Care Coordination needs related to resources to help meet financial needs at this time in a patient with stress, anxiety, and depression  Chronic Disease Management support and education needs related to effective management of stress, anxiety, and depression Financial Constraints.   Planned Interventions: Evaluation of current treatment plan related to depression, stress, and anxiety and patient's adherence to plan as established by provider. The patient is doing well today. She states that her husband is working at [employer] and this is helping a lot. She states that he still has not started his new job with [employer] yet. It is now slated to start in December of this year. She says that he is happy at [facility] [employer] and if he chooses to stay there to work then that is fine. She just wants him to be happy. She says she is staying in and denies any acute changes in her depression, anxiety, stress, or mental health concerns at this time.  Advised patient to call [facility] at [facility] for assistance with help getting her husbands medication due to being unemployed with no insurance. [phone] and ask for Milagros Reap Provided education to patient re: resources and help available to assist the patient with needs at this time due to her husband being unemployed. The patient states her husband is working now and she is very thankful for this.  Reviewed medications with patient and discussed compliance. The patient is compliant with medications and works with the pharm D for her medication needs. Denies any issues with non-compliance Collaborated with pharm D regarding [facility] assistance for  the patients husband due to no insurance and being unemployed. Provided information to the patient by a return call to the patient.  Provided patient with care guide and SW support as well as support from the interdisciplinary team for management of stress, anxiety, and depression  educational materials related to effective management of stress, anxiety, and depression. The patient is actively working with the LCSW for ongoing support and education. Reviewed scheduled/upcoming provider appointments including 12-17-2022 at 730 am Care Guide referral for community resources for [facility] for food insecurities and assistance with utilities. Has information from the care guides  Social Work referral for education and support and resources for effective management of stress, anxiety, and depression. The patient continues to work with the SW for assistance, education, and support.  Discussed plans with patient for ongoing care management follow up and provided patient with direct contact information for care management team Advised patient to discuss changes in mood, increased anxiety, depression, stress, or other mental health needs.  with provider Screening for signs  and symptoms of depression related to chronic disease state  Assessed social determinant of health barriers  Symptom Management: Take medications as prescribed   Attend all scheduled provider appointments Call provider office for new concerns or questions  call the Suicide and Crisis Lifeline: 988 call the [place] National Suicide Prevention Lifeline: (520) 861-5130 or TTY: (478)287-2831 TTY 779-261-1076) to talk to a trained counselor call 1-800-273-TALK (toll free, 24 hour hotline) if experiencing a Mental Health or Behavioral Health Crisis   Follow Up Plan: Telephone follow up appointment with care management team member scheduled for: 01-30-2023 at 230 pm       CCM Expected Outcome:  Monitor, Self-Manage, and Reduce Symptoms of  Hypertension       Current Barriers:  Chronic Disease Management support and education needs related to effective management of HTN BP Readings from Last 3 Encounters:  11/15/22 (!) 147/50  11/01/22 (!) 126/54  10/09/22 (!) 158/59     Planned Interventions: Evaluation of current treatment plan related to hypertension self management and patient's adherence to plan as established by provider. The patient states her blood pressures have been fluctuating some. She does take at home and records. Sees specialist on a regular basis. She denies any new issues related to her hospital stay in February and denies any new sx and sx at this time.    Provided education to patient re: stroke prevention, s/s of heart attack and stroke. The patient has high risk factors for HA and stroke. Had 2 heart attacks in 2022. States that she had taken NTG with minimal relief. Reflective listening. Praised for being proactive in her care and seeking help; Reviewed prescribed diet heart healthy/ADA diet. Review and education  Reviewed medications with patient and discussed importance of compliance. The patient states compliance with medications. Denies any medication needs at this time.;  Discussed plans with patient for ongoing care management follow up and provided patient with direct contact information for care management team; Advised patient, providing education and rationale, to monitor blood pressure daily and record, calling PCP for findings outside established parameters. The patient states her blood pressures systolic are around 120's and diastolic about 63 to 66;  Reviewed scheduled/upcoming provider appointments including: 12-17-2022 at 320 pm Advised patient to discuss changes in her blood pressures or heart health  with provider; Provided education on prescribed diet heart healthy/ADA diet. The patient is working with DSS currently hopeful to get food stamps due to her husband being out of work. Also talks to  the LCSW on a regular basis;  Discussed complications of poorly controlled blood pressure such as heart disease, stroke, circulatory complications, vision complications, kidney impairment, sexual dysfunction;  Screening for signs and symptoms of depression related to chronic disease state;  Assessed social determinant of health barriers;   Symptom Management: Take medications as prescribed   Attend all scheduled provider appointments Call provider office for new concerns or questions  call the Suicide and Crisis Lifeline: 988 call the [place] National Suicide Prevention Lifeline: 754-269-2938 or TTY: 276-080-8525 TTY 615-880-9116) to talk to a trained counselor call 1-800-273-TALK (toll free, 24 hour hotline) if experiencing a Mental Health or Behavioral Health Crisis  check blood pressure 3 times per week write blood pressure results in a log or diary learn about high blood pressure keep a blood pressure log take blood pressure log to all doctor appointments call doctor for signs and symptoms of high blood pressure develop an action plan for high blood pressure keep all doctor appointments take  medications for blood pressure exactly as prescribed report new symptoms to your doctor  Follow Up Plan: Telephone follow up appointment with care management team member scheduled for: 01-30-2023 at 230 pm          Plan:Telephone follow up appointment with care management team member scheduled for:  01-30-2023 at 230 pm  Alto Denver RN, MSN, CCM RN Care Manager  Chronic Care Management Direct Number: [phone]

## 2022-12-06 ENCOUNTER — Emergency Department (HOSPITAL_COMMUNITY): Payer: PPO

## 2022-12-06 ENCOUNTER — Inpatient Hospital Stay (HOSPITAL_COMMUNITY)
Admission: EM | Admit: 2022-12-06 | Discharge: 2022-12-16 | DRG: 250 | Disposition: A | Discharge status: Home / Self Care | Payer: PPO | Attending: Internal Medicine | Admitting: Internal Medicine

## 2022-12-06 ENCOUNTER — Other Ambulatory Visit: Payer: Self-pay

## 2022-12-06 ENCOUNTER — Inpatient Hospital Stay: Payer: PPO

## 2022-12-06 ENCOUNTER — Encounter (HOSPITAL_COMMUNITY): Payer: Self-pay | Admitting: Internal Medicine

## 2022-12-06 ENCOUNTER — Ambulatory Visit: Payer: PPO

## 2022-12-06 ENCOUNTER — Encounter (HOSPITAL_COMMUNITY): Payer: Self-pay | Admitting: Emergency Medicine

## 2022-12-06 DIAGNOSIS — I480 Paroxysmal atrial fibrillation: Secondary | ICD-10-CM | POA: Diagnosis present

## 2022-12-06 DIAGNOSIS — I5030 Unspecified diastolic (congestive) heart failure: Secondary | ICD-10-CM

## 2022-12-06 DIAGNOSIS — R042 Hemoptysis: Secondary | ICD-10-CM | POA: Diagnosis not present

## 2022-12-06 DIAGNOSIS — Z9861 Coronary angioplasty status: Secondary | ICD-10-CM

## 2022-12-06 DIAGNOSIS — Z91018 Allergy to other foods: Secondary | ICD-10-CM

## 2022-12-06 DIAGNOSIS — E782 Mixed hyperlipidemia: Secondary | ICD-10-CM | POA: Diagnosis present

## 2022-12-06 DIAGNOSIS — I214 Non-ST elevation (NSTEMI) myocardial infarction: Secondary | ICD-10-CM | POA: Diagnosis present

## 2022-12-06 DIAGNOSIS — G4733 Obstructive sleep apnea (adult) (pediatric): Secondary | ICD-10-CM | POA: Diagnosis not present

## 2022-12-06 DIAGNOSIS — Z91013 Allergy to seafood: Secondary | ICD-10-CM

## 2022-12-06 DIAGNOSIS — K5909 Other constipation: Secondary | ICD-10-CM | POA: Diagnosis present

## 2022-12-06 DIAGNOSIS — R457 State of emotional shock and stress, unspecified: Secondary | ICD-10-CM | POA: Diagnosis not present

## 2022-12-06 DIAGNOSIS — R0789 Other chest pain: Secondary | ICD-10-CM | POA: Diagnosis not present

## 2022-12-06 DIAGNOSIS — D631 Anemia in chronic kidney disease: Secondary | ICD-10-CM | POA: Diagnosis present

## 2022-12-06 DIAGNOSIS — R079 Chest pain, unspecified: Principal | ICD-10-CM | POA: Diagnosis present

## 2022-12-06 DIAGNOSIS — Z888 Allergy status to other drugs, medicaments and biological substances status: Secondary | ICD-10-CM

## 2022-12-06 DIAGNOSIS — N390 Urinary tract infection, site not specified: Secondary | ICD-10-CM | POA: Diagnosis present

## 2022-12-06 DIAGNOSIS — Z7982 Long term (current) use of aspirin: Secondary | ICD-10-CM

## 2022-12-06 DIAGNOSIS — R34 Anuria and oliguria: Secondary | ICD-10-CM | POA: Diagnosis not present

## 2022-12-06 DIAGNOSIS — Z8674 Personal history of sudden cardiac arrest: Secondary | ICD-10-CM

## 2022-12-06 DIAGNOSIS — J9611 Chronic respiratory failure with hypoxia: Secondary | ICD-10-CM | POA: Diagnosis present

## 2022-12-06 DIAGNOSIS — Z9889 Other specified postprocedural states: Secondary | ICD-10-CM

## 2022-12-06 DIAGNOSIS — I200 Unstable angina: Secondary | ICD-10-CM | POA: Diagnosis present

## 2022-12-06 DIAGNOSIS — I21A9 Other myocardial infarction type: Secondary | ICD-10-CM | POA: Diagnosis present

## 2022-12-06 DIAGNOSIS — F411 Generalized anxiety disorder: Secondary | ICD-10-CM | POA: Diagnosis not present

## 2022-12-06 DIAGNOSIS — Z95818 Presence of other cardiac implants and grafts: Secondary | ICD-10-CM

## 2022-12-06 DIAGNOSIS — I5042 Chronic combined systolic (congestive) and diastolic (congestive) heart failure: Secondary | ICD-10-CM | POA: Diagnosis present

## 2022-12-06 DIAGNOSIS — J449 Chronic obstructive pulmonary disease, unspecified: Secondary | ICD-10-CM | POA: Diagnosis present

## 2022-12-06 DIAGNOSIS — Z8249 Family history of ischemic heart disease and other diseases of the circulatory system: Secondary | ICD-10-CM

## 2022-12-06 DIAGNOSIS — Z6841 Body Mass Index (BMI) 40.0 and over, adult: Secondary | ICD-10-CM

## 2022-12-06 DIAGNOSIS — Z833 Family history of diabetes mellitus: Secondary | ICD-10-CM

## 2022-12-06 DIAGNOSIS — Z90711 Acquired absence of uterus with remaining cervical stump: Secondary | ICD-10-CM

## 2022-12-06 DIAGNOSIS — I237 Postinfarction angina: Secondary | ICD-10-CM | POA: Diagnosis present

## 2022-12-06 DIAGNOSIS — R040 Epistaxis: Secondary | ICD-10-CM | POA: Diagnosis not present

## 2022-12-06 DIAGNOSIS — E1022 Type 1 diabetes mellitus with diabetic chronic kidney disease: Secondary | ICD-10-CM | POA: Diagnosis present

## 2022-12-06 DIAGNOSIS — N189 Chronic kidney disease, unspecified: Secondary | ICD-10-CM | POA: Diagnosis present

## 2022-12-06 DIAGNOSIS — Z818 Family history of other mental and behavioral disorders: Secondary | ICD-10-CM

## 2022-12-06 DIAGNOSIS — I959 Hypotension, unspecified: Secondary | ICD-10-CM | POA: Diagnosis present

## 2022-12-06 DIAGNOSIS — Z87440 Personal history of urinary (tract) infections: Secondary | ICD-10-CM

## 2022-12-06 DIAGNOSIS — K219 Gastro-esophageal reflux disease without esophagitis: Secondary | ICD-10-CM | POA: Diagnosis present

## 2022-12-06 DIAGNOSIS — E038 Other specified hypothyroidism: Secondary | ICD-10-CM | POA: Diagnosis present

## 2022-12-06 DIAGNOSIS — I130 Hypertensive heart and chronic kidney disease with heart failure and stage 1 through stage 4 chronic kidney disease, or unspecified chronic kidney disease: Secondary | ICD-10-CM | POA: Diagnosis not present

## 2022-12-06 DIAGNOSIS — Z8673 Personal history of transient ischemic attack (TIA), and cerebral infarction without residual deficits: Secondary | ICD-10-CM

## 2022-12-06 DIAGNOSIS — T82855A Stenosis of coronary artery stent, initial encounter: Principal | ICD-10-CM | POA: Diagnosis present

## 2022-12-06 DIAGNOSIS — I25118 Atherosclerotic heart disease of native coronary artery with other forms of angina pectoris: Secondary | ICD-10-CM | POA: Diagnosis present

## 2022-12-06 DIAGNOSIS — Z886 Allergy status to analgesic agent status: Secondary | ICD-10-CM

## 2022-12-06 DIAGNOSIS — Z89412 Acquired absence of left great toe: Secondary | ICD-10-CM

## 2022-12-06 DIAGNOSIS — E1043 Type 1 diabetes mellitus with diabetic autonomic (poly)neuropathy: Secondary | ICD-10-CM | POA: Diagnosis present

## 2022-12-06 DIAGNOSIS — N1832 Chronic kidney disease, stage 3b: Secondary | ICD-10-CM | POA: Diagnosis present

## 2022-12-06 DIAGNOSIS — M069 Rheumatoid arthritis, unspecified: Secondary | ICD-10-CM | POA: Diagnosis present

## 2022-12-06 DIAGNOSIS — Z89422 Acquired absence of other left toe(s): Secondary | ICD-10-CM

## 2022-12-06 DIAGNOSIS — Z79899 Other long term (current) drug therapy: Secondary | ICD-10-CM

## 2022-12-06 DIAGNOSIS — M1A9XX0 Chronic gout, unspecified, without tophus (tophi): Secondary | ICD-10-CM | POA: Diagnosis present

## 2022-12-06 DIAGNOSIS — Z6839 Body mass index (BMI) 39.0-39.9, adult: Secondary | ICD-10-CM

## 2022-12-06 DIAGNOSIS — Z7902 Long term (current) use of antithrombotics/antiplatelets: Secondary | ICD-10-CM

## 2022-12-06 DIAGNOSIS — I493 Ventricular premature depolarization: Secondary | ICD-10-CM | POA: Diagnosis present

## 2022-12-06 DIAGNOSIS — F330 Major depressive disorder, recurrent, mild: Secondary | ICD-10-CM | POA: Diagnosis not present

## 2022-12-06 DIAGNOSIS — Z83438 Family history of other disorder of lipoprotein metabolism and other lipidemia: Secondary | ICD-10-CM

## 2022-12-06 DIAGNOSIS — R0902 Hypoxemia: Secondary | ICD-10-CM | POA: Diagnosis not present

## 2022-12-06 DIAGNOSIS — J9621 Acute and chronic respiratory failure with hypoxia: Secondary | ICD-10-CM | POA: Diagnosis present

## 2022-12-06 DIAGNOSIS — E1051 Type 1 diabetes mellitus with diabetic peripheral angiopathy without gangrene: Secondary | ICD-10-CM | POA: Diagnosis present

## 2022-12-06 DIAGNOSIS — G894 Chronic pain syndrome: Secondary | ICD-10-CM | POA: Diagnosis present

## 2022-12-06 DIAGNOSIS — I5032 Chronic diastolic (congestive) heart failure: Secondary | ICD-10-CM | POA: Diagnosis present

## 2022-12-06 DIAGNOSIS — I2510 Atherosclerotic heart disease of native coronary artery without angina pectoris: Secondary | ICD-10-CM | POA: Diagnosis present

## 2022-12-06 DIAGNOSIS — Z87891 Personal history of nicotine dependence: Secondary | ICD-10-CM

## 2022-12-06 DIAGNOSIS — Z5986 Financial insecurity: Secondary | ICD-10-CM

## 2022-12-06 DIAGNOSIS — Z794 Long term (current) use of insulin: Secondary | ICD-10-CM

## 2022-12-06 DIAGNOSIS — Y831 Surgical operation with implant of artificial internal device as the cause of abnormal reaction of the patient, or of later complication, without mention of misadventure at the time of the procedure: Secondary | ICD-10-CM | POA: Diagnosis present

## 2022-12-06 DIAGNOSIS — I5043 Acute on chronic combined systolic (congestive) and diastolic (congestive) heart failure: Secondary | ICD-10-CM | POA: Diagnosis present

## 2022-12-06 DIAGNOSIS — Z9049 Acquired absence of other specified parts of digestive tract: Secondary | ICD-10-CM

## 2022-12-06 DIAGNOSIS — I252 Old myocardial infarction: Secondary | ICD-10-CM

## 2022-12-06 DIAGNOSIS — Z803 Family history of malignant neoplasm of breast: Secondary | ICD-10-CM

## 2022-12-06 DIAGNOSIS — N184 Chronic kidney disease, stage 4 (severe): Secondary | ICD-10-CM | POA: Diagnosis present

## 2022-12-06 DIAGNOSIS — N2581 Secondary hyperparathyroidism of renal origin: Secondary | ICD-10-CM | POA: Diagnosis present

## 2022-12-06 DIAGNOSIS — Z9981 Dependence on supplemental oxygen: Secondary | ICD-10-CM

## 2022-12-06 DIAGNOSIS — Z8679 Personal history of other diseases of the circulatory system: Secondary | ICD-10-CM

## 2022-12-06 DIAGNOSIS — N179 Acute kidney failure, unspecified: Secondary | ICD-10-CM | POA: Diagnosis present

## 2022-12-06 DIAGNOSIS — I471 Supraventricular tachycardia, unspecified: Secondary | ICD-10-CM | POA: Diagnosis present

## 2022-12-06 DIAGNOSIS — Z801 Family history of malignant neoplasm of trachea, bronchus and lung: Secondary | ICD-10-CM

## 2022-12-06 DIAGNOSIS — I2584 Coronary atherosclerosis due to calcified coronary lesion: Secondary | ICD-10-CM | POA: Diagnosis present

## 2022-12-06 HISTORY — DX: Type 1 diabetes mellitus without complications: E10.9

## 2022-12-06 LAB — CBC WITH DIFFERENTIAL/PLATELET
Abs Immature Granulocytes: 0.07 10*3/uL (ref 0.00–0.07)
Basophils Absolute: 0.1 10*3/uL (ref 0.0–0.1)
Basophils Relative: 0 %
Eosinophils Absolute: 0.2 10*3/uL (ref 0.0–0.5)
Eosinophils Relative: 1 %
HCT: 29.7 % — ABNORMAL LOW (ref 36.0–46.0)
Hemoglobin: 9.3 g/dL — ABNORMAL LOW (ref 12.0–15.0)
Immature Granulocytes: 0 %
Lymphocytes Relative: 7 %
Lymphs Abs: 1.2 10*3/uL (ref 0.7–4.0)
MCH: 26.6 pg (ref 26.0–34.0)
MCHC: 31.3 g/dL (ref 30.0–36.0)
MCV: 84.9 fL (ref 80.0–100.0)
Monocytes Absolute: 1 10*3/uL (ref 0.1–1.0)
Monocytes Relative: 6 %
Neutro Abs: 14.9 10*3/uL — ABNORMAL HIGH (ref 1.7–7.7)
Neutrophils Relative %: 86 %
Platelets: 235 10*3/uL (ref 150–400)
RBC: 3.5 MIL/uL — ABNORMAL LOW (ref 3.87–5.11)
RDW: 15.4 % (ref 11.5–15.5)
WBC: 17.3 10*3/uL — ABNORMAL HIGH (ref 4.0–10.5)
nRBC: 0 % (ref 0.0–0.2)

## 2022-12-06 LAB — BRAIN NATRIURETIC PEPTIDE: B Natriuretic Peptide: 143.5 pg/mL — ABNORMAL HIGH (ref 0.0–100.0)

## 2022-12-06 LAB — COMPREHENSIVE METABOLIC PANEL
ALT: 18 U/L (ref 0–44)
AST: 17 U/L (ref 15–41)
Albumin: 3.2 g/dL — ABNORMAL LOW (ref 3.5–5.0)
Alkaline Phosphatase: 139 U/L — ABNORMAL HIGH (ref 38–126)
Anion gap: 13 (ref 5–15)
BUN: 65 mg/dL — ABNORMAL HIGH (ref 6–20)
CO2: 23 mmol/L (ref 22–32)
Calcium: 8.8 mg/dL — ABNORMAL LOW (ref 8.9–10.3)
Chloride: 99 mmol/L (ref 98–111)
Creatinine, Ser: 2.73 mg/dL — ABNORMAL HIGH (ref 0.44–1.00)
GFR, Estimated: 19 mL/min — ABNORMAL LOW (ref >60)
Glucose, Bld: 213 mg/dL — ABNORMAL HIGH (ref 70–99)
Potassium: 4.1 mmol/L (ref 3.5–5.1)
Sodium: 135 mmol/L (ref 135–145)
Total Bilirubin: 0.5 mg/dL (ref 0.3–1.2)
Total Protein: 7.3 g/dL (ref 6.5–8.1)

## 2022-12-06 LAB — URINALYSIS, COMPLETE (UACMP) WITH MICROSCOPIC
Bilirubin Urine: NEGATIVE
Glucose, UA: NEGATIVE mg/dL
Hgb urine dipstick: NEGATIVE
Ketones, ur: NEGATIVE mg/dL
Nitrite: NEGATIVE
Protein, ur: NEGATIVE mg/dL
Specific Gravity, Urine: 1.008 (ref 1.005–1.030)
pH: 5 (ref 5.0–8.0)

## 2022-12-06 LAB — LACTIC ACID, PLASMA
Lactic Acid, Venous: 1.4 mmol/L (ref 0.5–1.9)
Lactic Acid, Venous: 1.1 mmol/L (ref 0.5–1.9)

## 2022-12-06 LAB — GLUCOSE, CAPILLARY
Glucose-Capillary: 201 mg/dL — ABNORMAL HIGH (ref 70–99)
Glucose-Capillary: 246 mg/dL — ABNORMAL HIGH (ref 70–99)

## 2022-12-06 LAB — TROPONIN I (HIGH SENSITIVITY)
Troponin I (High Sensitivity): 8 ng/L (ref <18)
Troponin I (High Sensitivity): 9 ng/L (ref <18)
Troponin I (High Sensitivity): 9 ng/L (ref <18)

## 2022-12-06 MED ORDER — NITROGLYCERIN 0.4 MG SL SUBL
0.4000 mg | SUBLINGUAL_TABLET | SUBLINGUAL | Status: DC | PRN
  Administered 2022-12-07 – 2022-12-10 (×6): 0.4 mg via SUBLINGUAL
  Filled 2022-12-06 (×4): qty 1

## 2022-12-06 MED ORDER — ALLOPURINOL 300 MG PO TABS
300.0000 mg | ORAL_TABLET | Freq: Every day | ORAL | Status: DC
  Administered 2022-12-07 – 2022-12-16 (×10): 300 mg via ORAL
  Filled 2022-12-06: qty 1
  Filled 2022-12-06 (×2): qty 3
  Filled 2022-12-06 (×7): qty 1

## 2022-12-06 MED ORDER — ISOSORBIDE MONONITRATE ER 30 MG PO TB24
30.0000 mg | ORAL_TABLET | Freq: Every day | ORAL | Status: DC
  Administered 2022-12-07: 30 mg via ORAL
  Filled 2022-12-06: qty 1

## 2022-12-06 MED ORDER — LACTATED RINGERS IV BOLUS
500.0000 mL | Freq: Once | INTRAVENOUS | Status: AC
  Administered 2022-12-06: 500 mL via INTRAVENOUS

## 2022-12-06 MED ORDER — NITROGLYCERIN 2 % TD OINT
1.0000 [in_us] | TOPICAL_OINTMENT | Freq: Once | TRANSDERMAL | Status: AC
  Administered 2022-12-06: 1 [in_us] via TOPICAL
  Filled 2022-12-06: qty 1

## 2022-12-06 MED ORDER — ONDANSETRON HCL 4 MG/2ML IJ SOLN
4.0000 mg | Freq: Four times a day (QID) | INTRAMUSCULAR | Status: DC | PRN
  Administered 2022-12-07 – 2022-12-09 (×2): 4 mg via INTRAVENOUS
  Filled 2022-12-06 (×2): qty 2

## 2022-12-06 MED ORDER — LORAZEPAM 0.5 MG PO TABS
0.5000 mg | ORAL_TABLET | Freq: Every day | ORAL | Status: DC | PRN
  Administered 2022-12-07 – 2022-12-15 (×6): 0.5 mg via ORAL
  Filled 2022-12-06 (×6): qty 1

## 2022-12-06 MED ORDER — SULFAMETHOXAZOLE-TRIMETHOPRIM 400-80 MG PO TABS
1.0000 | ORAL_TABLET | Freq: Two times a day (BID) | ORAL | Status: DC
  Filled 2022-12-06: qty 1

## 2022-12-06 MED ORDER — ASPIRIN 81 MG PO TBEC
81.0000 mg | DELAYED_RELEASE_TABLET | Freq: Every day | ORAL | Status: DC
  Administered 2022-12-07 – 2022-12-16 (×10): 81 mg via ORAL
  Filled 2022-12-06 (×10): qty 1

## 2022-12-06 MED ORDER — INSULIN ASPART 100 UNIT/ML IJ SOLN
0.0000 [IU] | Freq: Three times a day (TID) | INTRAMUSCULAR | Status: DC
  Administered 2022-12-07 (×2): 2 [IU] via SUBCUTANEOUS
  Administered 2022-12-07: 5 [IU] via SUBCUTANEOUS
  Administered 2022-12-08 (×3): 3 [IU] via SUBCUTANEOUS
  Administered 2022-12-09 (×2): 5 [IU] via SUBCUTANEOUS
  Administered 2022-12-09: 3 [IU] via SUBCUTANEOUS
  Administered 2022-12-10 (×2): 2 [IU] via SUBCUTANEOUS
  Administered 2022-12-11 (×2): 3 [IU] via SUBCUTANEOUS
  Administered 2022-12-12: 2 [IU] via SUBCUTANEOUS
  Administered 2022-12-12: 8 [IU] via SUBCUTANEOUS
  Administered 2022-12-12 – 2022-12-14 (×4): 3 [IU] via SUBCUTANEOUS
  Administered 2022-12-14: 5 [IU] via SUBCUTANEOUS
  Administered 2022-12-14: 2 [IU] via SUBCUTANEOUS
  Administered 2022-12-15: 3 [IU] via SUBCUTANEOUS
  Administered 2022-12-15: 5 [IU] via SUBCUTANEOUS
  Administered 2022-12-16: 2 [IU] via SUBCUTANEOUS
  Administered 2022-12-16: 5 [IU] via SUBCUTANEOUS

## 2022-12-06 MED ORDER — CARVEDILOL 12.5 MG PO TABS
12.5000 mg | ORAL_TABLET | Freq: Two times a day (BID) | ORAL | Status: DC
  Administered 2022-12-06 – 2022-12-16 (×20): 12.5 mg via ORAL
  Filled 2022-12-06 (×20): qty 1

## 2022-12-06 MED ORDER — EZETIMIBE 10 MG PO TABS
10.0000 mg | ORAL_TABLET | Freq: Every day | ORAL | Status: DC
  Administered 2022-12-07 – 2022-12-16 (×10): 10 mg via ORAL
  Filled 2022-12-06 (×10): qty 1

## 2022-12-06 MED ORDER — INSULIN DETEMIR 100 UNIT/ML ~~LOC~~ SOLN
30.0000 [IU] | Freq: Every day | SUBCUTANEOUS | Status: DC
  Administered 2022-12-07 – 2022-12-16 (×10): 30 [IU] via SUBCUTANEOUS
  Filled 2022-12-06 (×10): qty 0.3

## 2022-12-06 MED ORDER — ACETAMINOPHEN 325 MG PO TABS: 650.0000 mg | ORAL_TABLET | ORAL | Status: DC | PRN

## 2022-12-06 MED ORDER — HYDROCODONE-ACETAMINOPHEN 7.5-325 MG PO TABS: 1.0000 | ORAL_TABLET | Freq: Once | ORAL | Status: DC

## 2022-12-06 MED ORDER — PANTOPRAZOLE SODIUM 40 MG PO TBEC
40.0000 mg | DELAYED_RELEASE_TABLET | Freq: Every day | ORAL | Status: DC
  Administered 2022-12-07 – 2022-12-16 (×10): 40 mg via ORAL
  Filled 2022-12-06 (×10): qty 1

## 2022-12-06 MED ORDER — BUSPIRONE HCL 5 MG PO TABS
5.0000 mg | ORAL_TABLET | Freq: Three times a day (TID) | ORAL | Status: DC
  Administered 2022-12-06 – 2022-12-16 (×29): 5 mg via ORAL
  Filled 2022-12-06 (×6): qty 1
  Filled 2022-12-06: qty 0.5
  Filled 2022-12-06 (×2): qty 1
  Filled 2022-12-06: qty 0.5
  Filled 2022-12-06 (×2): qty 1
  Filled 2022-12-06: qty 0.5
  Filled 2022-12-06 (×3): qty 1
  Filled 2022-12-06: qty 0.5
  Filled 2022-12-06 (×3): qty 1
  Filled 2022-12-06: qty 0.5
  Filled 2022-12-06 (×12): qty 1

## 2022-12-06 MED ORDER — HEPARIN SODIUM (PORCINE) 5000 UNIT/ML IJ SOLN
5000.0000 [IU] | Freq: Three times a day (TID) | INTRAMUSCULAR | Status: DC
  Administered 2022-12-06 – 2022-12-07 (×4): 5000 [IU] via SUBCUTANEOUS
  Filled 2022-12-06 (×4): qty 1

## 2022-12-06 MED ORDER — LORAZEPAM 1 MG PO TABS: 0.5000 mg | ORAL_TABLET | Freq: Every day | ORAL | Status: DC | PRN

## 2022-12-06 MED ORDER — INSULIN ASPART 100 UNIT/ML IJ SOLN
0.0000 [IU] | Freq: Every day | INTRAMUSCULAR | Status: DC
  Administered 2022-12-06: 2 [IU] via SUBCUTANEOUS

## 2022-12-06 MED ORDER — CLOPIDOGREL BISULFATE 75 MG PO TABS
75.0000 mg | ORAL_TABLET | Freq: Every day | ORAL | Status: DC
  Administered 2022-12-07: 75 mg via ORAL
  Filled 2022-12-06: qty 1

## 2022-12-06 MED ORDER — HYDROCODONE-ACETAMINOPHEN 10-325 MG PO TABS
1.0000 | ORAL_TABLET | Freq: Once | ORAL | Status: AC
  Administered 2022-12-06: 1 via ORAL
  Filled 2022-12-06: qty 1

## 2022-12-06 NOTE — H&P (Addendum)
History and Physical   Dana [name] ZOX:[id number] DOB: 1960-08-24 DOA: 12/06/2022  PCP: Blane Ohara, MD   Patient coming from: Home  Chief Complaint: Chest pain  HPI: Dana [name] is a 62 y.o. female with medical history significant of CAD status post multiple stenting, atrial fibrillation status post ablation, mitral regurgitation status post MitraClip, OSA on CPAP, obesity, cardiac arrest, chronic respiratory failure with hypoxia, chronic pain, CKD 3B, anemia, hypertension, hyperlipidemia, gout, GERD, anxiety, depression, diastolic CHF presenting with chest pain.  Patient states that she woke up this morning with left-sided chest pain.  She states that she tried nitroglycerin with transient improvement but the pain returned.  She then took 2 more nitroglycerin without improvement.  EMS was called and patient received aspirin with some minimal improvement in chest pain.  She does report chest pain as being a sharp/stabbing/squeezing pain with associated nausea and diaphoresis but no vomiting.  Currently improved to 4/10.  She denies fevers, chills, shortness of breath, abdominal pain, constipation, diarrhea.  ED Course: Vital signs in the ED stable.  Lab workup notable for CMP with BUN of 65, creatinine elevated 2.73 from baseline 1.9, glucose 213, calcium 8.8, albumin 3.2, alk phos 139.  CBC with leukocytosis to 17.3 from baseline of around 13, hemoglobin stable at 9.3.  Troponin negative x 2 with repeat pending.  Lactic acid normal with repeat pending.  Urinalysis with leukocytes and bacteria.  Chest x-ray showing moderate cardiomegaly, mild cephalization of vessels and chronic left humerus defect.  Patient received Norco, Nitropaste, 500 cc IV fluids in the ED.  Cardiology consulted and are following and are considering catheterization tomorrow.  Review of Systems: As per HPI otherwise all other systems reviewed and are negative.  Past Medical History:  Diagnosis Date   Acquired  absence of left great toe (HCC)    Acquired absence of other left toe(s) (HCC)    ACS (acute coronary syndrome) (HCC)    Anemia    Anoxic brain injury (HCC)    Anxiety    CAD (coronary artery disease)    DES mLAD 04/07/15; DES dRCA & DES pPDA 08/30/16; DES mCX & PTCA OM1 09/29/20   Chronic diastolic (congestive) heart failure (HCC)    Chronic pain    Chronic systolic (congestive) heart failure (HCC)    CKD (chronic kidney disease)    Contrast dye induced nephropathy, possible 09/03/2020   COPD (chronic obstructive pulmonary disease) (HCC)    Diabetes (HCC)type 1    Diastolic CHF (HCC)    Dyspnea    Family history of breast cancer 10/23/2021   Gastro-esophageal reflux disease without esophagitis    Hyperlipidemia    Hypertension    Hypertensive heart disease with heart failure (HCC)    Hypoxemia    Idiopathic gout, multiple sites    Leukocytosis 03/29/2022   Localized edema    Low back pain    Mitral regurgitation    Mitral regurgitation    Mixed hyperlipidemia    Mixed incontinence    Morbid (severe) obesity due to excess calories (HCC)    Neuromuscular disorder (HCC)    Neuropathy    Non-ST elevation (NSTEMI) myocardial infarction (HCC)    08/29/20, 09/29/20   OSA (obstructive sleep apnea) 09/03/2020   Other specified hypothyroidism    PAF (paroxysmal atrial fibrillation) (HCC)    s/p pulmonary vein isolation by cryoablation 10/04/15 Dr. Rudolpho Sevin in [facility]   PEA (Pulseless electrical activity) [facility])    ~ 01/13/21 at [facility] during  admission DKA, volume overload, possible CAP, hypoxia s/p ACLS with ROSC ~ 10-15   Pneumonia    Primary insomnia    Rheumatoid arthritis (HCC)    Stroke (HCC)    Thyroid disease    Type 1 diabetes mellitus (HCC)    Vitamin D deficiency     Past Surgical History:  Procedure Laterality Date   ABDOMINAL HYSTERECTOMY     Partial- Still has both ovaries   CARDIOVASCULAR STRESS TEST  08/13/2014   Nuclear; Normal   CARPAL TUNNEL RELEASE      CATARACT EXTRACTION, BILATERAL     CESAREAN SECTION     CHOLECYSTECTOMY     CORONARY ANGIOPLASTY WITH STENT PLACEMENT     3 blockages/ 1 stent   CORONARY PRESSURE/FFR STUDY N/A 09/07/2022   Procedure: INTRAVASCULAR PRESSURE WIRE/FFR STUDY;  Surgeon: Swaziland, Peter M, MD;  Location: [facility] INVASIVE CV LAB;  Service: Cardiovascular;  Laterality: N/A;   CORONARY STENT INTERVENTION N/A 09/29/2020   Procedure: CORONARY STENT INTERVENTION;  Surgeon: Tonny Bollman, MD;  Location: [facility] INVASIVE CV LAB;  Service: Cardiovascular;  Laterality: N/A;  CFX   CORONARY STENT INTERVENTION N/A 09/07/2022   Procedure: CORONARY STENT INTERVENTION;  Surgeon: Swaziland, Peter M, MD;  Location: [facility] INVASIVE CV LAB;  Service: Cardiovascular;  Laterality: N/A;   EYE SURGERY     LEFT HEART CATH AND CORONARY ANGIOGRAPHY N/A 09/29/2020   Procedure: LEFT HEART CATH AND CORONARY ANGIOGRAPHY;  Surgeon: Tonny Bollman, MD;  Location: [facility] INVASIVE CV LAB;  Service: Cardiovascular;  Laterality: N/A;   LEFT HEART CATH AND CORONARY ANGIOGRAPHY N/A 09/07/2022   Procedure: LEFT HEART CATH AND CORONARY ANGIOGRAPHY;  Surgeon: Swaziland, Peter M, MD;  Location: [facility] INVASIVE CV LAB;  Service: Cardiovascular;  Laterality: N/A;   MITRAL VALVE REPAIR N/A 05/18/2021   Procedure: MITRAL VALVE REPAIR;  Surgeon: Orbie Pyo, MD;  Location: [facility] INVASIVE CV LAB;  Service: Cardiovascular;  Laterality: N/A;   RIGHT HEART CATH N/A 04/27/2021   Procedure: RIGHT HEART CATH;  Surgeon: Dolores Patty, MD;  Location: [facility] INVASIVE CV LAB;  Service: Cardiovascular;  Laterality: N/A;   RIGHT/LEFT HEART CATH AND CORONARY ANGIOGRAPHY N/A 01/07/2017   Procedure: Right/Left Heart Cath and Coronary Angiography;  Surgeon: Laurey Morale, MD;  Location: [facility] INVASIVE CV LAB;  Service: Cardiovascular;  Laterality: N/A;   ROTATOR CUFF REPAIR     TEE WITHOUT CARDIOVERSION N/A 04/28/2021   Procedure: TRANSESOPHAGEAL ECHOCARDIOGRAM (TEE);  Surgeon: Dolores Patty, MD;   Location: [facility] ENDOSCOPY;  Service: Cardiovascular;  Laterality: N/A;   TEE WITHOUT CARDIOVERSION N/A 05/18/2021   Procedure: TRANSESOPHAGEAL ECHOCARDIOGRAM (TEE);  Surgeon: Orbie Pyo, MD;  Location: [facility] INVASIVE CV LAB;  Service: Cardiovascular;  Laterality: N/A;   TOE AMPUTATION Left    US ECHOCARDIOGRAPHY  05/2017   Normal    Social History  reports that she quit smoking about 40 years ago. Her smoking use included cigarettes. She started smoking about 41 years ago. She has been exposed to tobacco smoke. She has never used smokeless tobacco. She reports that she does not drink alcohol and does not use drugs.  Allergies  Allergen Reactions   Naproxen Hives and Rash   Celecoxib Other (See Comments)    Stomach pain   Aspirin Other (See Comments)    CAN NOT TAKE DUE TO ULCERS (documented previously) but takes every day currently (as of 09/29/20) without reported issues Can take coated aspirin   Glucosamine Hives, Swelling and Other (See Comments)  Angioedema    Iron     IV iron transfusion - hives - Feb 2022 hospitalization   Metoclopramide Other (See Comments)    Facial twitching and stuttering   Metolazone Other (See Comments)    Acute renal failure   Other Hives    Strawberry allergy   Shellfish-Derived Products Hives, Swelling and Other (See Comments)    Angioedema    Statins Other (See Comments)    Muscle pain   Strawberry (Diagnostic)    Strawberry Extract    Strawberry Flavor    Strawberry Leaves Extract    Tramadol Nausea And Vomiting         Family History  Problem Relation Age of Onset   Breast cancer Mother 69       contralateral breast ca dx 85   Coronary artery disease Mother    Hypertension Mother    Hyperlipidemia Mother    Diabetes type II Mother    Lung cancer Mother 24   Diabetes Father    Hypertension Father    Mental illness Sister    Bipolar disorder Other    Depression Other    Schizophrenia Other    Cancer Other        MGF's  sisters x2; unknown type  Reviewed on admission  Prior to Admission medications   Medication Sig Start Date End Date Taking? Authorizing Provider  allopurinol (ZYLOPRIM) 300 MG tablet Take 1 tablet (300 mg total) by mouth daily. 07/05/22   Blane Ohara, MD  aspirin EC 81 MG tablet Take 1 tablet (81 mg total) by mouth daily. Swallow whole. 09/09/22   Cyndi Bender, NP  busPIRone (BUSPAR) 5 MG tablet Take 1 tablet (5 mg total) by mouth 3 (three) times daily. 10/09/22   Cox, Fritzi Mandes, MD  carvedilol (COREG) 12.5 MG tablet TAKE ONE TABLET BY MOUTH TWICE DAILY Patient taking differently: Take 12.5 mg by mouth 2 (two) times daily with a meal. 01/09/22   Milford, Anderson Malta, FNP  cephALEXin (KEFLEX) 250 MG capsule Take 250 mg by mouth at bedtime. 11/30/22   [provider]  clopidogrel (PLAVIX) 75 MG tablet Take 1 tablet (75 mg total) by mouth daily. 07/05/22   CoxFritzi Mandes, MD  Continuous Blood Gluc Sensor (FREESTYLE LIBRE 2 SENSOR) MISC USE TO check blood glucose AS DIRECTED AND CHANGE sensor every 14 DAYS 01/11/22   [provider]  CRANBERRY PO Take 2 tablets by mouth daily.    [provider]  EPINEPHrine 0.3 mg/0.3 mL IJ SOAJ injection Use as directed for life-threatening allergic reaction. 06/24/18   Marcelyn Bruins, MD  estradiol (ESTRACE) 0.1 MG/GM vaginal cream Place 1 Applicatorful vaginally every other day. 11/27/22   [provider]  Evolocumab (REPATHA SURECLICK) 140 MG/ML SOAJ Inject 1 Dose into the skin every 14 (fourteen) days. 01/17/22   Hilty, Lisette Abu, MD  ezetimibe (ZETIA) 10 MG tablet Take 1 tablet (10 mg total) by mouth daily. 11/05/22   CoxFritzi Mandes, MD  hydrALAZINE (APRESOLINE) 25 MG tablet Take 1 tablet (25 mg total) by mouth 3 (three) times daily. 09/21/22   Milford, Anderson Malta, FNP  HYDROcodone-acetaminophen (NORCO) 7.5-325 MG tablet TAKE ONE TABLET BY MOUTH THREE TIMES DAILY AS NEEDED 11/28/22   Cox, Fritzi Mandes, MD  insulin aspart (NOVOLOG FLEXPEN)  100 UNIT/ML FlexPen 2-10 U before meals based on SSI. 10/09/22   Cox, Kirsten, MD  Insulin Pen Needle (BD PEN NEEDLE NANO 2ND GEN) 32G X 4 MM MISC Inject 1 each into the  skin in the morning, at noon, in the evening, and at bedtime. 08/30/21   CoxFritzi Mandes, MD  isosorbide mononitrate (IMDUR) 30 MG 24 hr tablet Take 1 tablet (30 mg total) by mouth daily. 07/05/22   Cox, Fritzi Mandes, MD  latanoprost (XALATAN) 0.005 % ophthalmic solution Place 1 drop into both eyes at bedtime.    [provider]  LEVEMIR FLEXPEN 100 UNIT/ML FlexPen Inject 50 units into THE SKIN daily Patient taking differently: Inject 50 Units into the skin daily. 07/30/22   Blane Ohara, MD  linaclotide Karlene Einstein) 145 MCG CAPS capsule Take 1 capsule (145 mcg total) by mouth daily before breakfast. 08/31/22   Cox, Kirsten, MD  LORazepam (ATIVAN) 0.5 MG tablet TAKE ONE TABLET BY MOUTH ONCE DAILY AS NEEDED FOR ANXIETY 10/21/22   Cox, Kirsten, MD  magnesium oxide (MAG-OX) 400 MG tablet Take 2 tablets (800 mg total) by mouth daily. Take 2 (400 mg) daily=800 mg 04/09/18   Graciella Freer, PA-C  nitroGLYCERIN (NITROSTAT) 0.4 MG SL tablet Dissolve 1 tab under tongue as needed for chest pain. May repeat every 5 minutes x 2 doses. If no relief call 9-1-1. Please call [phone] to schedule an appointment for future refills. Thank you. 07/13/22   Janetta Hora, PA-C  Omega-3 Fatty Acids (FISH OIL PO) Take 1 capsule by mouth daily.    [provider]  OXYGEN Inhale 2 L into the lungs at bedtime.    [provider]  pantoprazole (PROTONIX) 40 MG tablet Take 1 tablet (40 mg total) by mouth daily. 07/05/22   Cox, Fritzi Mandes, MD  potassium chloride SA (KLOR-CON M) 20 MEQ tablet TAKE ONE TABLET BY MOUTH ONCE daily 07/06/22   Cox, Fritzi Mandes, MD  Probiotic CAPS Take 1 capsule by mouth daily.    [provider]  promethazine (PHENERGAN) 25 MG tablet Take 1 tablet (25 mg total) by mouth daily as needed. 10/22/22   Cox,  Fritzi Mandes, MD  sulfamethoxazole-trimethoprim (BACTRIM DS) 800-160 MG tablet Take 1 tablet by mouth 2 (two) times daily. 11/30/22   [provider]  torsemide (DEMADEX) 20 MG tablet TAKE 4 TABLETS BY MOUTH ONCE DAILY 04/09/22   Bensimhon, Bevelyn Buckles, MD    Physical Exam: Vitals:   12/06/22 1415 12/06/22 1430 12/06/22 1445 12/06/22 1511  BP: (!) 128/57 (!) 129/58 (!) 125/53   Pulse: 71 70 75   Resp: 17 13 20    Temp:    97.7 F (36.5 C)  TempSrc:    Oral  SpO2: 94% 94% 92%   Weight:      Height:        Physical Exam Constitutional:      General: She is not in acute distress.    Appearance: Normal appearance. She is obese.  HENT:     Head: Normocephalic and atraumatic.     Mouth/Throat:     Mouth: Mucous membranes are moist.     Pharynx: Oropharynx is clear.  Eyes:     Extraocular Movements: Extraocular movements intact.     Pupils: Pupils are equal, round, and reactive to light.  Cardiovascular:     Rate and Rhythm: Normal rate and regular rhythm.     Pulses: Normal pulses.     Heart sounds: Normal heart sounds.  Pulmonary:     Effort: Pulmonary effort is normal. No respiratory distress.     Breath sounds: Normal breath sounds.  Abdominal:     General: Bowel sounds are normal. There is no distension.  Palpations: Abdomen is soft.     Tenderness: There is no abdominal tenderness.  Musculoskeletal:        General: No swelling or deformity.  Skin:    General: Skin is warm and dry.  Neurological:     General: No focal deficit present.     Mental Status: Mental status is at baseline.    Labs on Admission: I have personally reviewed following labs and imaging studies  CBC: Recent Labs  Lab 12/04/22 1133 12/06/22 1045  WBC 11.0* 17.3*  NEUTROABS 7.6 14.9*  HGB 9.7* 9.3*  HCT 31.6* 29.7*  MCV 87.8 84.9  PLT 248 235    Basic Metabolic Panel: Recent Labs  Lab 12/06/22 1045  NA 135  K 4.1  CL 99  CO2 23  GLUCOSE 213*  BUN 65*  CREATININE 2.73*   CALCIUM 8.8*    GFR: Estimated Creatinine Clearance: 27.1 mL/min (A) (by C-G formula based on SCr of 2.73 mg/dL (H)).  Liver Function Tests: Recent Labs  Lab 12/06/22 1045  AST 17  ALT 18  ALKPHOS 139*  BILITOT 0.5  PROT 7.3  ALBUMIN 3.2*    Urine analysis:    Component Value Date/Time   COLORURINE YELLOW 12/06/2022 1344   APPEARANCEUR CLOUDY (A) 12/06/2022 1344   LABSPEC 1.008 12/06/2022 1344   PHURINE 5.0 12/06/2022 1344   GLUCOSEU NEGATIVE 12/06/2022 1344   HGBUR NEGATIVE 12/06/2022 1344   BILIRUBINUR NEGATIVE 12/06/2022 1344   BILIRUBINUR negative 09/27/2022 1131   BILIRUBINUR negative 07/11/2021 0812   KETONESUR NEGATIVE 12/06/2022 1344   PROTEINUR NEGATIVE 12/06/2022 1344   UROBILINOGEN 0.2 09/27/2022 1131   NITRITE NEGATIVE 12/06/2022 1344   LEUKOCYTESUR TRACE (A) 12/06/2022 1344    Radiological Exams on Admission: DG Chest Portable 1 View  Result Date: 12/06/2022 CLINICAL DATA:  Left chest pain radiating to the left arm EXAM: PORTABLE CHEST 1 VIEW COMPARISON:  09/07/2022 FINDINGS: Mitral clips are obscured by overlying ECG lead. Coronary atherosclerosis noted. A small device projecting over the left lower lobe pulmonary artery is compatible with a CardioMEMS type pulmonary arterial pressure monitor. The patient is rotated to the right on today's radiograph, reducing diagnostic sensitivity and specificity. Moderate cardiomegaly. Mild cephalization of blood flow, nonspecific on semi erect positioning, but pulmonary venous hypertension is not excluded. No overt edema. No blunting of the lateral costophrenic angles. Chronic deformity of the left proximal humerus likely from healed fracture. IMPRESSION: 1. Moderate cardiomegaly with mild cephalization of blood flow, nonspecific on semi erect positioning, but pulmonary venous hypertension is not excluded. No overt edema. 2. Mitraclips are obscured by overlying ECG lead. Pulmonary arterial pressure monitor noted. 3.  Coronary atherosclerosis. 4. Chronic deformity of the left proximal humerus likely from healed fracture. Electronically Signed   By: Gaylyn Rong M.D.   On: 12/06/2022 11:37    EKG: Independently reviewed.  Sinus rhythm at 77 bpm.  Low voltage multiple leads.  QTc borderline at 491.  Assessment/Plan Principal Problem:   Unstable angina (HCC) Active Problems:   Acute renal failure superimposed on stage 3b chronic kidney disease (HCC)   Anemia in stage 4 chronic kidney disease (HCC)   Chronic pain syndrome   Coronary artery disease of native artery of native heart with stable angina pectoris (HCC)   GERD without esophagitis   S/P ablation of atrial fibrillation   Diastolic heart failure (HCC)   Class 2 severe obesity due to excess calories with serious comorbidity and body mass index (BMI) of 39.0 to 39.9  in adult [facility])   Hypertensive heart and renal disease with congestive heart failure (HCC)   Depression, major, recurrent, mild (HCC)   Chronic respiratory failure with hypoxia (HCC)   Chronic gout without tophus   OSA on CPAP   History of cardiac arrest   S/P mitral valve clip implantation   GAD (generalized anxiety disorder)   Mixed hyperlipidemia   Chronic kidney disease, stage 4 (severe) (HCC)   Unstable angina CAD > Patient presenting with chest pain consistent with unstable angina.  Similar to previous cardiac chest pain. > Negative troponins but has had negative troponins with prior unstable angina and required stenting as recently as this February. > Some improvement with nitro and aspirin but not resolved. > Cardiology has been consulted and are following, consideration for catheterization if creatinine is able to be improved. - Monitor on telemetry overnight - Appreciate cardiology recommendations - Correct renal function as below - Continue with as needed nitroglycerin - Continue with aspirin, carvedilol, Imdur - Continue Zetia  UTI > Recently diagnosed via her  urologist > On day 4 of 7 of Bactrim. I do not see culture results in out EMR, may need to contact her urolgist for more info if change in antibiotics indicated. - Reduce dose of bactrim to SS starting tomorrow given AKI  Hyperlipidemia - Continue Zetia - On Repatha outpatient  Hypertension - Continue home carvedilol, Imdur - Holding hydralazine and torsemide for now  Atrial fibrillation - Status post ablation - Noted  Mitral regurgitation - Status post MitraClip, did have issue with 1 clip becoming entangled during procedure.  Chronic diastolic CHF > Last echo in January of this year with EF 55-60%, indeterminate diastolic function, normal RV function. > Currently on torsemide but has AKI as below. > No increased oxygen demand, chest x-ray with mild cephalization and moderate cardiomegaly but no frank pulmonary edema. > Has received IV fluids in the ED as below for AKI, BNP recently added on to further characterize if there is a degree of acute on chronic diastolic HF. - Hold off on further IV fluids or diuresis for now, pending BNP - Continue home carvedilol, Imdur - Follow-up BNP - Strict I's and O's, daily weights  AKI on CKD 3B > Creatinine elevated to 2.73 from baseline 1.9.  Does not appear to be grossly volume overloaded so has received 500 cc IV fluids in the ED. > BNP recently added on and is pending.  > Will hold off on further IV fluids versus diuresis pending results of BNP. - Follow-up BMP - Trend renal function and electrolytes  Diabetes > 50 units daily and sliding scale insulin at home. - 30 units daily - SSI  History of cardiac arrest > Presumed to be PEA with respiratory etiology - Noted  OSA - Continue home CPAP  Gout - Continue home allopurinol  GERD - Continue home PPI  Anxiety Depression - Continue BuSpar and as needed Ativan  DVT prophylaxis: Heparin Code Status:   Full Family Communication:  None on admission Disposition Plan:    Patient is from:  Home  Anticipated DC to:  Home  Anticipated DC date:  1 to 4 days  Anticipated DC barriers: None  Consults called:  Cardiology Admission status:  Observation, telemetry  Severity of Illness: The appropriate patient status for this patient is OBSERVATION. Observation status is judged to be reasonable and necessary in order to provide the required intensity of service to ensure the patient's safety. The patient's presenting symptoms, physical  exam findings, and initial radiographic and laboratory data in the context of their medical condition is felt to place them at decreased risk for further clinical deterioration. Furthermore, it is anticipated that the patient will be medically stable for discharge from the hospital within 2 midnights of admission.    Synetta Fail MD [facility]  How to contact the [facility] Attending or Consulting provider 7A - 7P or covering provider during after hours 7P -7A, for this patient?   Check the care team in [facility] and look for a) attending/consulting [facility] provider listed and b) the [facility] team listed Log into [email or web address] and use [place]'s universal password to access. If you do not have the password, please contact the hospital operator. Locate the [facility] provider you are looking for under [facility] and page to a number that you can be directly reached. If you still have difficulty reaching the provider, please page the [facility] (Director on Call) for the Hospitalists listed on amion for assistance.  12/06/2022, 4:53 PM

## 2022-12-06 NOTE — Plan of Care (Signed)

## 2022-12-06 NOTE — ED Triage Notes (Signed)
Pt to ER via EMS with reports of left sided chest pain that started around 0830 this AM and radiates into left arm.  Pt states this pain is similar to previous MI.  Pt took 3 SL NTG at home with decrease in pain.  Pt states nausea, denies vomiting or diaphoresis.  Pt also reports pain to left shoulder blade.

## 2022-12-06 NOTE — Consult Note (Addendum)
Cardiology Consultation   Patient ID: KALIANNAH [name] MRN: [id number]; DOB: 09/16/1960  Admit date: 12/06/2022 Date of Consult: 12/06/2022  PCP:  Blane Ohara, MD   [facility] Providers Cardiologist:  Arvilla Meres, MD        Patient Profile:   Dana [name] is a 62 y.o. female with a hx of chronic diastolic HF, cardiomems 02/2018, CAD with hx DES RCA and LAD -2018, 2022 DES mLCX, and PTCA in side branch of OM1 DM, HTN, HLD, PAD, OSA on CPAP, PAF s/p ablation, PAD s/p L great toe and 4th toe amputation, stage IIIb CKD who is being seen 12/06/2022 for the evaluation of chest pain at the request of Dr. Posey Rea.  History of Present Illness:   [name] with above hx and NSTEMI 09/2020 and acute CHF, LHC done revealed moderate mLAD and mRCA disease, severe mLCX stenosis with PCI/DES mLCX and side branch PTCA of OM1.  She did develop contrast induced nephropathy after cath , with Cr pk of 5.5 from 2.2.  Also developed gastroparesis, epistaxis and acute blood loss anemia with need for transfusion.  All improved prior to discharge.  She has hx of DKA, in June 2022 with CVA confirmed by MRI with anoxic brain injury- had been intubated but she improved and extubated.  Her SCr returned to baseline 1.6-1.8.  She did go through rehab. .Zio placed 8/22 for palpitations, showing mostly SB/SR no afib, 66 SVT runs, frequent isolated SVE (11.2%).   In 05/2021 she was admitted for A/C CHF after failing outpt escalation of diuretic.   She was enrolled in FASTR trial and diuresed > 15 lb. Hospitalization complicated by AKI on CKD, Scr up to 3.13. RHC showed mild to moderate elevating filling pressures with large v-waves in PCWP tracing suggesting severe MR vs severe diastolic dysfunction. TEE 55-60% showed severe central MR.    Pt underwent TEER of MV with placement of of 1XT W and 1NT W clip on 05/18/2021. NT W clip was entangled and had to be deployed into lateral part of valve. No additional clip  placement could be preformed. Echo 11/04 with EF 50-55% and residual moderate MR.   Echo 3/23 with EF 55-60%, RV ok mod MR with mean gradient 7-8 mmHg (mod MR) no plan for 3 rd clip. Echo 07/2022 EF 55-60%, normal RV mod MR, mG 7.5 mmHg, essentially unchanged from prior echo.     09/07/22 admitted with chest pain, troponin pk 40 and cardiac cath with pLAD to mLAD with 90% stenosis, with DES placed.  pRCA 60%, mLCX 100% stenosis, 1st OM 60% stenosis, patent stent in distal RCA,  plan to treat LCX occlusion medically .   Hx of leukocytosis and recently saw GU + UTI was placed on Bactrim DS for 7 days then another ABX for long term. She is not sure of name.     Confirmed Type 1 DM on insulin  Now presenting to ER with lt sided chest pain.  Pain began around 0830 today with radiation into left arm and lt scapula.  She feels it is similar to pain with MI,  pt took 3 SL NTG at home with improved pain. She did have associated nausea and diaphoresis.  Significant diaphoresis at one point       EKG:  The EKG was personally reviewed and demonstrates:  SR 77 Qtc 491 ms no acute ST elevation or changes Telemetry:  Telemetry was personally reviewed and demonstrates:  SR  Na 135,  K+ 4.1 glucose 213 Cr 2.73 GFR 19  Hs troponin  8 and 9 WBC 17.3  hgb 9.3 plts 235   PCXR IMPRESSION: 1. Moderate cardiomegaly with mild cephalization of blood flow, nonspecific on semi erect positioning, but pulmonary venous hypertension is not excluded. No overt edema. 2. Mitraclips are obscured by overlying ECG lead. Pulmonary arterial pressure monitor noted. 3. Coronary atherosclerosis. 4. Chronic deformity of the left proximal humerus likely from healed fracture.  VS BP 131/57 P 73 T 98.4  sp02 on RA 97%  Past Medical History:  Diagnosis Date   Acquired absence of left great toe (HCC)    Acquired absence of other left toe(s) (HCC)    ACS (acute coronary syndrome) (HCC)    Anemia    Anoxic brain injury (HCC)     Anxiety    CAD (coronary artery disease)    DES mLAD 04/07/15; DES dRCA & DES pPDA 08/30/16; DES mCX & PTCA OM1 09/29/20   Chronic diastolic (congestive) heart failure (HCC)    Chronic pain    Chronic systolic (congestive) heart failure (HCC)    CKD (chronic kidney disease)    Contrast dye induced nephropathy, possible 09/03/2020   COPD (chronic obstructive pulmonary disease) (HCC)    Diabetes (HCC)type 1    Diastolic CHF (HCC)    Dyspnea    Family history of breast cancer 10/23/2021   Gastro-esophageal reflux disease without esophagitis    Hyperlipidemia    Hypertension    Hypertensive heart disease with heart failure (HCC)    Hypoxemia    Idiopathic gout, multiple sites    Leukocytosis 03/29/2022   Localized edema    Low back pain    Mitral regurgitation    Mitral regurgitation    Mixed hyperlipidemia    Mixed incontinence    Morbid (severe) obesity due to excess calories (HCC)    Neuromuscular disorder (HCC)    Neuropathy    Non-ST elevation (NSTEMI) myocardial infarction (HCC)    08/29/20, 09/29/20   OSA (obstructive sleep apnea) 09/03/2020   Other specified hypothyroidism    PAF (paroxysmal atrial fibrillation) (HCC)    s/p pulmonary vein isolation by cryoablation 10/04/15 Dr. Rudolpho Sevin in [facility]   PEA (Pulseless electrical activity) [facility])    ~ 01/13/21 at [facility] during admission DKA, volume overload, possible CAP, hypoxia s/p ACLS with ROSC ~ 10-15   Pneumonia    Primary insomnia    Rheumatoid arthritis (HCC)    Stroke (HCC)    Thyroid disease    Type 1 diabetes mellitus (HCC)    Vitamin D deficiency     Past Surgical History:  Procedure Laterality Date   ABDOMINAL HYSTERECTOMY     Partial- Still has both ovaries   CARDIOVASCULAR STRESS TEST  08/13/2014   Nuclear; Normal   CARPAL TUNNEL RELEASE     CATARACT EXTRACTION, BILATERAL     CESAREAN SECTION     CHOLECYSTECTOMY     CORONARY ANGIOPLASTY WITH STENT PLACEMENT     3 blockages/ 1 stent   CORONARY  PRESSURE/FFR STUDY N/A 09/07/2022   Procedure: INTRAVASCULAR PRESSURE WIRE/FFR STUDY;  Surgeon: Swaziland, Peter M, MD;  Location: [facility] INVASIVE CV LAB;  Service: Cardiovascular;  Laterality: N/A;   CORONARY STENT INTERVENTION N/A 09/29/2020   Procedure: CORONARY STENT INTERVENTION;  Surgeon: Tonny Bollman, MD;  Location: [facility] INVASIVE CV LAB;  Service: Cardiovascular;  Laterality: N/A;  CFX   CORONARY STENT INTERVENTION N/A 09/07/2022   Procedure: CORONARY STENT INTERVENTION;  Surgeon:  Swaziland, Peter M, MD;  Location: [facility] INVASIVE CV LAB;  Service: Cardiovascular;  Laterality: N/A;   EYE SURGERY     LEFT HEART CATH AND CORONARY ANGIOGRAPHY N/A 09/29/2020   Procedure: LEFT HEART CATH AND CORONARY ANGIOGRAPHY;  Surgeon: Tonny Bollman, MD;  Location: [facility] INVASIVE CV LAB;  Service: Cardiovascular;  Laterality: N/A;   LEFT HEART CATH AND CORONARY ANGIOGRAPHY N/A 09/07/2022   Procedure: LEFT HEART CATH AND CORONARY ANGIOGRAPHY;  Surgeon: Swaziland, Peter M, MD;  Location: [facility] INVASIVE CV LAB;  Service: Cardiovascular;  Laterality: N/A;   MITRAL VALVE REPAIR N/A 05/18/2021   Procedure: MITRAL VALVE REPAIR;  Surgeon: Orbie Pyo, MD;  Location: [facility] INVASIVE CV LAB;  Service: Cardiovascular;  Laterality: N/A;   RIGHT HEART CATH N/A 04/27/2021   Procedure: RIGHT HEART CATH;  Surgeon: Dolores Patty, MD;  Location: [facility] INVASIVE CV LAB;  Service: Cardiovascular;  Laterality: N/A;   RIGHT/LEFT HEART CATH AND CORONARY ANGIOGRAPHY N/A 01/07/2017   Procedure: Right/Left Heart Cath and Coronary Angiography;  Surgeon: Laurey Morale, MD;  Location: [facility] INVASIVE CV LAB;  Service: Cardiovascular;  Laterality: N/A;   ROTATOR CUFF REPAIR     TEE WITHOUT CARDIOVERSION N/A 04/28/2021   Procedure: TRANSESOPHAGEAL ECHOCARDIOGRAM (TEE);  Surgeon: Dolores Patty, MD;  Location: [facility] ENDOSCOPY;  Service: Cardiovascular;  Laterality: N/A;   TEE WITHOUT CARDIOVERSION N/A 05/18/2021   Procedure: TRANSESOPHAGEAL ECHOCARDIOGRAM  (TEE);  Surgeon: Orbie Pyo, MD;  Location: [facility] INVASIVE CV LAB;  Service: Cardiovascular;  Laterality: N/A;   TOE AMPUTATION Left    US ECHOCARDIOGRAPHY  05/2017   Normal     Home Medications:  Prior to Admission medications   Medication Sig Start Date End Date Taking? Authorizing Provider  allopurinol (ZYLOPRIM) 300 MG tablet Take 1 tablet (300 mg total) by mouth daily. 07/05/22   Blane Ohara, MD  aspirin EC 81 MG tablet Take 1 tablet (81 mg total) by mouth daily. Swallow whole. 09/09/22   Cyndi Bender, NP  busPIRone (BUSPAR) 5 MG tablet Take 1 tablet (5 mg total) by mouth 3 (three) times daily. 10/09/22   Cox, Fritzi Mandes, MD  carvedilol (COREG) 12.5 MG tablet TAKE ONE TABLET BY MOUTH TWICE DAILY Patient taking differently: Take 12.5 mg by mouth 2 (two) times daily with a meal. 01/09/22   Milford, Anderson Malta, FNP  clopidogrel (PLAVIX) 75 MG tablet Take 1 tablet (75 mg total) by mouth daily. 07/05/22   CoxFritzi Mandes, MD  Continuous Blood Gluc Sensor (FREESTYLE LIBRE 2 SENSOR) MISC USE TO check blood glucose AS DIRECTED AND CHANGE sensor every 14 DAYS 01/11/22   [provider]  CRANBERRY PO Take 2 tablets by mouth daily.    [provider]  EPINEPHrine 0.3 mg/0.3 mL IJ SOAJ injection Use as directed for life-threatening allergic reaction. 06/24/18   Marcelyn Bruins, MD  Evolocumab (REPATHA SURECLICK) 140 MG/ML SOAJ Inject 1 Dose into the skin every 14 (fourteen) days. 01/17/22   Hilty, Lisette Abu, MD  ezetimibe (ZETIA) 10 MG tablet Take 1 tablet (10 mg total) by mouth daily. 11/05/22   CoxFritzi Mandes, MD  hydrALAZINE (APRESOLINE) 25 MG tablet Take 1 tablet (25 mg total) by mouth 3 (three) times daily. 09/21/22   Milford, Anderson Malta, FNP  HYDROcodone-acetaminophen (NORCO) 7.5-325 MG tablet TAKE ONE TABLET BY MOUTH THREE TIMES DAILY AS NEEDED 11/28/22   Blane Ohara, MD  insulin aspart (NOVOLOG FLEXPEN) 100 UNIT/ML FlexPen 2-10 U before meals based on SSI. 10/09/22   Cox,  Kirsten, MD   Insulin Pen Needle (BD PEN NEEDLE NANO 2ND GEN) 32G X 4 MM MISC Inject 1 each into the skin in the morning, at noon, in the evening, and at bedtime. 08/30/21   CoxFritzi Mandes, MD  isosorbide mononitrate (IMDUR) 30 MG 24 hr tablet Take 1 tablet (30 mg total) by mouth daily. 07/05/22   Cox, Fritzi Mandes, MD  latanoprost (XALATAN) 0.005 % ophthalmic solution Place 1 drop into both eyes at bedtime.    [provider]  LEVEMIR FLEXPEN 100 UNIT/ML FlexPen Inject 50 units into THE SKIN daily Patient taking differently: Inject 50 Units into the skin daily. 07/30/22   Blane Ohara, MD  linaclotide Karlene Einstein) 145 MCG CAPS capsule Take 1 capsule (145 mcg total) by mouth daily before breakfast. 08/31/22   Cox, Kirsten, MD  LORazepam (ATIVAN) 0.5 MG tablet TAKE ONE TABLET BY MOUTH ONCE DAILY AS NEEDED FOR ANXIETY 10/21/22   Cox, Kirsten, MD  magnesium oxide (MAG-OX) 400 MG tablet Take 2 tablets (800 mg total) by mouth daily. Take 2 (400 mg) daily=800 mg 04/09/18   Graciella Freer, PA-C  nitroGLYCERIN (NITROSTAT) 0.4 MG SL tablet Dissolve 1 tab under tongue as needed for chest pain. May repeat every 5 minutes x 2 doses. If no relief call 9-1-1. Please call [phone] to schedule an appointment for future refills. Thank you. 07/13/22   Janetta Hora, PA-C  Omega-3 Fatty Acids (FISH OIL PO) Take 1 capsule by mouth daily.    [provider]  OXYGEN Inhale 2 L into the lungs at bedtime.    [provider]  pantoprazole (PROTONIX) 40 MG tablet Take 1 tablet (40 mg total) by mouth daily. 07/05/22   Cox, Fritzi Mandes, MD  potassium chloride SA (KLOR-CON M) 20 MEQ tablet TAKE ONE TABLET BY MOUTH ONCE daily 07/06/22   Cox, Fritzi Mandes, MD  Probiotic CAPS Take 1 capsule by mouth daily.    [provider]  promethazine (PHENERGAN) 25 MG tablet Take 1 tablet (25 mg total) by mouth daily as needed. 10/22/22   Cox, Fritzi Mandes, MD  torsemide (DEMADEX) 20 MG tablet TAKE 4 TABLETS BY MOUTH ONCE DAILY  04/09/22   Bensimhon, Bevelyn Buckles, MD    Inpatient Medications: Scheduled Meds:  nitroGLYCERIN  1 inch Topical Once   Continuous Infusions:   PRN Meds:   Allergies:    Allergies  Allergen Reactions   Naproxen Hives and Rash   Celecoxib Other (See Comments)    Stomach pain   Aspirin Other (See Comments)    CAN NOT TAKE DUE TO ULCERS (documented previously) but takes every day currently (as of 09/29/20) without reported issues Can take coated aspirin   Glucosamine Hives, Swelling and Other (See Comments)    Angioedema    Iron     IV iron transfusion - hives - Feb 2022 hospitalization   Metoclopramide Other (See Comments)    Facial twitching and stuttering   Metolazone Other (See Comments)    Acute renal failure   Other Hives    Strawberry allergy   Shellfish-Derived Products Hives, Swelling and Other (See Comments)    Angioedema    Statins Other (See Comments)    Muscle pain   Strawberry (Diagnostic)    Strawberry Extract    Strawberry Flavor    Strawberry Leaves Extract    Tramadol Nausea And Vomiting         Social History:   Social History   Socioeconomic History   Marital status: Married  Spouse name: [name]   Number of children: 2   Years of education: 53   Highest education level: Master's degree (e.g., MA, MS, MEng, MEd, MSW, MBA)  Occupational History   Occupation: on disability/retired early former Engineer, site  Tobacco Use   Smoking status: Former    Types: Cigarettes    Start date: 1983    Quit date: 1984    Years since quitting: 40.4    Passive exposure: Past   Smokeless tobacco: Never  Vaping Use   Vaping Use: Never used  Substance and Sexual Activity   Alcohol use: Never   Drug use: Never   Sexual activity: Not on file  Other Topics Concern   Not on file  Social History Narrative   Not on file   Social Determinants of Health   Financial Resource Strain: Medium Risk (10/30/2022)   Overall Financial Resource Strain (CARDIA)     Difficulty of Paying Living Expenses: Somewhat hard  Food Insecurity: Food Insecurity Present (07/26/2022)   Hunger Vital Sign    Worried About Running Out of Food in the Last Year: Sometimes true    Ran Out of Food in the Last Year: Sometimes true  Transportation Needs: No Transportation Needs (07/25/2022)   PRAPARE - Administrator, Civil Service (Medical): No    Lack of Transportation (Non-Medical): No  Physical Activity: Insufficiently Active (10/30/2022)   Exercise Vital Sign    Days of Exercise per Week: 1 day    Minutes of Exercise per Session: 10 min  Stress: Stress Concern Present (10/30/2022)   [employer] of Occupational Health - Occupational Stress Questionnaire    Feeling of Stress : To some extent  Social Connections: Moderately Integrated (07/25/2022)   Social Connection and Isolation Panel [NHANES]    Frequency of Communication with Friends and Family: More than three times a week    Frequency of Social Gatherings with Friends and Family: More than three times a week    Attends Religious Services: More than 4 times per year    Active Member of [employer] or Organizations: No    Attends Banker Meetings: Never    Marital Status: Married  Catering manager Violence: Not At Risk (07/25/2022)   Humiliation, Afraid, Rape, and Kick questionnaire    Fear of Current or Ex-Partner: No    Emotionally Abused: No    Physically Abused: No    Sexually Abused: No    Family History:    Family History  Problem Relation Age of Onset   Breast cancer Mother 39       contralateral breast ca dx 26   Coronary artery disease Mother    Hypertension Mother    Hyperlipidemia Mother    Diabetes type II Mother    Lung cancer Mother 85   Diabetes Father    Hypertension Father    Mental illness Sister    Bipolar disorder Other    Depression Other    Schizophrenia Other    Cancer Other        MGF's sisters x2; unknown type     ROS:  Please see the history of  present illness.  General:no colds or fevers, no weight changes Skin:no rashes or ulcers HEENT:no blurred vision, no congestion CV:see HPI PUL:see HPI  hx COPD GI:no diarrhea constipation or melena, no indigestion GU:no hematuria, no dysuria--recent UTI on bactrim DS  MS:no joint pain, no claudication Neuro:no syncope, no lightheadedness Endo:+ diabetes, + thyroid disease  All other  ROS reviewed and negative.     Physical Exam/Data:   Vitals:   12/06/22 1145 12/06/22 1200 12/06/22 1300 12/06/22 1400  BP: (!) 136/59 (!) 130/54 (!) 142/68 (!) 123/50  Pulse: 74 76 74 73  Resp: 16 (!) 21 16 16   Temp:      TempSrc:      SpO2: 95% 95% 97% 95%  Weight:      Height:       No intake or output data in the 24 hours ending 12/06/22 1445    12/06/2022   10:39 AM 11/15/2022    2:35 PM 11/01/2022   10:30 AM  Last 3 Weights  Weight (lbs) 242 lb 236 lb 12 oz 235 lb  Weight (kg) 109.77 kg 107.389 kg 106.595 kg     Body mass index is 40.9 kg/m.  General:  Well nourished, well developed, in no acute distress, still with 5/10 chest pain, lt ant pain HEENT: normal Neck: no JVD Vascular: No carotid bruits; Distal pulses 2+ bilaterally Cardiac:  normal S1, S2; RRR; no murmur gallup rub or click Lungs:  clear to auscultation bilaterally, no wheezing, rhonchi or rales  Abd: soft, nontender, no hepatomegaly  Ext: tr edema lower ext with support stockings in place Musculoskeletal:  No deformities, BUE and BLE strength normal and equal Skin: warm and dry  Neuro:  alert and oriented X 3 MAE follows commands, no focal abnormalities noted Psych:  Normal affect     Relevant CV Studies: - TEE (10/22): EF 55-60%, basal inferior akinesis, severe central MR. - Echo (10/22): EF 65-70%, RV ok, moderate MR - Echo (5/21): EF 60-65%, RV ok, Grade II DD. - Echo (2/22): EF 55-60%, RV ok, Grade II DD, mod MR - Echo (3/22): EF 55-60%, Grade II DD, normal RV - Echo (6/22): EF 60%, mod MR/TR (per chart review  at [facility]). - Zio (8/19): no arrhythmias, occasional PAC/PVCs - Zio (9/22): mostly SB/SR no afib, 66 SVT runs, frequent isolated SVE (11.2%).   - LHC (3/22): 3 vessel CAD with moderate mid-LAD and mid-RCA stenoses, and severe mid-LCx stenosis; successful PCI of the mid-circumflex, reducing severe 90% stenosis to 0% with a 2.75x22 mm Resolute Onyx DES, provisional side branch angioplasty of the OM1 with a 2.5 mm balloon through the stent struts  Cardiac cath 09/07/22     Mid LAD to Dist LAD lesion is 60% stenosed.   Prox RCA lesion is 60% stenosed.   1st Mrg-1 lesion is 30% stenosed.   Mid Cx-2 lesion is 100% stenosed.   1st Mrg-2 lesion is 60% stenosed.   Prox LAD to Mid LAD lesion is 90% stenosed.   A drug-eluting stent was successfully placed using a SYNERGY XD 2.75X28.   Post intervention, there is a 0% residual stenosis.   Mid LAD lesion is 40% stenosed.   Non-stenotic Mid Cx-1 lesion was previously treated.   Non-stenotic Dist RCA lesion was previously treated.   Post intervention, there is a 0% residual stenosis.   LV end diastolic pressure is mildly elevated.   2 vessel obstructive CAD. New 90% stenosis in the proximal LAD with RFR 0.48. The stent in the LCx is occluded after a large first OM Patent stent in the distal RCA Mildly elevated LVEDP 21 mm Hg Successful PCI of the proximal LAD with DES x 1 overlapping with prior stent.    Plan: observe overnight. Anticipate DC in am. DAPT indefinitely with ASA/Plavix.  Will treat LCx occlusion medically.  Diagnostic Dominance: Right  Intervention      Laboratory Data:  High Sensitivity Troponin:   Recent Labs  Lab 12/06/22 1045 12/06/22 1304  TROPONINIHS 8 9     Chemistry Recent Labs  Lab 12/06/22 1045  NA 135  K 4.1  CL 99  CO2 23  GLUCOSE 213*  BUN 65*  CREATININE 2.73*  CALCIUM 8.8*  GFRNONAA 19*  ANIONGAP 13    Recent Labs  Lab 12/06/22 1045  PROT 7.3  ALBUMIN 3.2*  AST 17  ALT 18   ALKPHOS 139*  BILITOT 0.5   Lipids No results for input(s): "CHOL", "TRIG", "HDL", "LABVLDL", "LDLCALC", "CHOLHDL" in the last 168 hours.  Hematology Recent Labs  Lab 12/04/22 1133 12/06/22 1045  WBC 11.0* 17.3*  RBC 3.60* 3.50*  HGB 9.7* 9.3*  HCT 31.6* 29.7*  MCV 87.8 84.9  MCH 26.9 26.6  MCHC 30.7 31.3  RDW 15.2 15.4  PLT 248 235   Thyroid No results for input(s): "TSH", "FREET4" in the last 168 hours.  BNPNo results for input(s): "BNP", "PROBNP" in the last 168 hours.  DDimer No results for input(s): "DDIMER" in the last 168 hours.   Radiology/Studies:  DG Chest Portable 1 View  Result Date: 12/06/2022 CLINICAL DATA:  Left chest pain radiating to the left arm EXAM: PORTABLE CHEST 1 VIEW COMPARISON:  09/07/2022 FINDINGS: Mitral clips are obscured by overlying ECG lead. Coronary atherosclerosis noted. A small device projecting over the left lower lobe pulmonary artery is compatible with a CardioMEMS type pulmonary arterial pressure monitor. The patient is rotated to the right on today's radiograph, reducing diagnostic sensitivity and specificity. Moderate cardiomegaly. Mild cephalization of blood flow, nonspecific on semi erect positioning, but pulmonary venous hypertension is not excluded. No overt edema. No blunting of the lateral costophrenic angles. Chronic deformity of the left proximal humerus likely from healed fracture. IMPRESSION: 1. Moderate cardiomegaly with mild cephalization of blood flow, nonspecific on semi erect positioning, but pulmonary venous hypertension is not excluded. No overt edema. 2. Mitraclips are obscured by overlying ECG lead. Pulmonary arterial pressure monitor noted. 3. Coronary atherosclerosis. 4. Chronic deformity of the left proximal humerus likely from healed fracture. Electronically Signed   By: Gaylyn Rong M.D.   On: 12/06/2022 11:37     Assessment and Plan:   Chest pain, EKG without acute changes, first- hs troponin is neg.  Second  pending.  Does have hx of CAD with stent to RCA and LAD, LCX - last cath 09/07/22 with stent to pLAD to mLAD.  Prior stent to LCX patent but LCX occluded just distal to that stent. Begin Heparin.  On plavix and ASA (hx of GI bleed on ASA but is tolerating currently and is on protonix) . Has rec'd hydrocodone - initial NTG did help but not since-try NTG paste- with elevated Cr and hx of contrast nephropathy will hold cath for now.   Will discuss with Dr. Izora Ribas. Hold  Bactrim and will make NPO after MN for possible Cath tomorrow  CAD with multiple stents last one in Feb this year. Mitral regurgitation, severe undergoing TEER 05/18/21 with 2 clips placed, one became entangled and had to be deployed, followup echo with stable mod MR   .mG 7.5 mmHg  Elevated WBC --- check lactic acid -hx leukocytosis has been 10.9 to 12.9 (has been unclear etiology) HTN on coreg, imdur and hydralazine 50 mg TID CKD 3b to stage 4 baseline Cr 2 to 2.5  with hx of contrast induced  nephropathy,  at discharge Cr 1.94 (Cr 09/17/22 was 1.89)  today SCr 2.73  Chronic diastolic HF currently euvolemic on torsemide 80 mg daily  DM-1 on home insulin Chronic anemia due to elevated CKD hgb 10.4 baseline followed by oncology unable to take oral iron due to GI upset. HLD on repatha and zetia continue --intolerance to statins (in lipid clinic with Dr. Rennis Golden)  In 08/2022 discharge she was discharged on home 02 OSA on CPAP continue  Prolonged QTc 491 ms, monitor and avoid QT prolonging meds UTI on Bactrim DS hold for now  Risk Assessment/Risk Scores:     TIMI Risk Score for Unstable Angina or Non-ST Elevation MI:   The patient's TIMI risk score is 4, which indicates a 20% risk of all cause mortality, new or recurrent myocardial infarction or need for urgent revascularization in the next 14 days.          For questions or updates, please contact [facility] Please consult [email or web address] for contact info under     Signed, Nada Boozer, NP  12/06/2022 2:45 PM  Personally seen and examined. Agree with APP above with the following comments:  Briefly 62 yo F with a history of complex CAD and MV disease s/p   mTEER with recent, AF s/p prior ablation LAD intervention with WBC, AKI on CKD, T1 DM who presents for chest pain.  Patient notes that she had relatively stable disease until AM of today.  CP at rest.  She had hoped it would resolve.  When there was not improvement she called EMS.  The was still having low grade CP at time of admission; we have ordered nitro for this.  Breathing has been ok; she has been doing well s/p Cardiomems and Mitra-Clip. She has been recently dealing with a complex UTI.  She was on Bactrim for this.  She just started this yesterday. Exam notable for Morbid obesity, depressed mood, NAD at second evaluation.  Soft systolic murmur, support stocking on; R femoral pulse +2 otherwise as above.  EKG SR rate 77 Qtc 495 Personally reviewed relevant tests; she has fairly diffuse disease from her prior LHC; she has only faint collaterals.  Would recommend  - her WBC, bactrim, and AKI may be related; hopefully other medication therapy is available - we have discussed her complex CAD; if she her creatinine is back to ~ 2.0; we will plan for [facility] tomorrow with Dr. Eldridge Dace. - unable to get ranaexa for Qtc - rest as above  Riley Lam, MD FASE [facility] Cardiologist [facility]  [address] [place], [address] [place], [place] [address] [phone]  4:47 PM

## 2022-12-06 NOTE — Progress Notes (Signed)
RT placed patient on CPAP HS. 2L O2 bleed in needed. Patient tolerating well.  ?

## 2022-12-06 NOTE — ED Provider Notes (Signed)
[place] EMERGENCY DEPARTMENT AT [facility] Provider Note  CSN: [id number] Arrival date & time: 12/06/22 1031  Chief Complaint(s) Chest Pain  HPI Dana [name] is a 62 y.o. female with PMH diastolic CHF, CAD status post DES placement, type 1 diabetes, HTN, HLD, peripheral arterial disease status post great toe and fourth toe amputation, OSA on CPAP, paroxysmal A-fib status post ablation, CKD 3, recent hospitalization on 09/08/2022 for unstable angina where she was found to have 100% circumflex and 90% proximal LAD occlusion with DES placement who presents emergency room for evaluation of chest pain.  She states that she woke up this morning and started to have left-sided chest pain.  She states that she took a single nitroglycerin sublingual tablets and her pain started to improve but it returned shortly after and she took 2 additional nitro tablets without improvement of chest pain.  States that pain is sharp, stabbing and feels like someone is "squeezing her heart" as well as her left shoulder blade.  Endorses  associated nausea and diaphoresis, but no vomiting.  She states that her symptoms feel mildly improved with the 324 of aspirin given by EMS and rates her chest pain as a 4 out of 10 right now.  Currently denies abdominal pain, nausea, vomiting, headache, fever or other systemic symptoms.  Past Medical History Past Medical History:  Diagnosis Date   Acquired absence of left great toe (HCC)    Acquired absence of other left toe(s) (HCC)    ACS (acute coronary syndrome) (HCC)    Anemia    Anoxic brain injury (HCC)    Anxiety    CAD (coronary artery disease)    DES mLAD 04/07/15; DES dRCA & DES pPDA 08/30/16; DES mCX & PTCA OM1 09/29/20   Chronic diastolic (congestive) heart failure (HCC)    Chronic pain    Chronic systolic (congestive) heart failure (HCC)    CKD (chronic kidney disease)    Contrast dye induced nephropathy, possible 09/03/2020   COPD (chronic obstructive  pulmonary disease) (HCC)    Diabetes (HCC)type 1    Diastolic CHF (HCC)    Dyspnea    Family history of breast cancer 10/23/2021   Gastro-esophageal reflux disease without esophagitis    Hyperlipidemia    Hypertension    Hypertensive heart disease with heart failure (HCC)    Hypoxemia    Idiopathic gout, multiple sites    Leukocytosis 03/29/2022   Localized edema    Low back pain    Mitral regurgitation    Mitral regurgitation    Mixed hyperlipidemia    Mixed incontinence    Morbid (severe) obesity due to excess calories (HCC)    Neuromuscular disorder (HCC)    Neuropathy    Non-ST elevation (NSTEMI) myocardial infarction (HCC)    08/29/20, 09/29/20   OSA (obstructive sleep apnea) 09/03/2020   Other specified hypothyroidism    PAF (paroxysmal atrial fibrillation) (HCC)    s/p pulmonary vein isolation by cryoablation 10/04/15 Dr. Rudolpho Sevin in [facility]   PEA (Pulseless electrical activity) [facility])    ~ 01/13/21 at [facility] during admission DKA, volume overload, possible CAP, hypoxia s/p ACLS with ROSC ~ 10-15   Pneumonia    Primary insomnia    Rheumatoid arthritis (HCC)    Stroke (HCC)    Thyroid disease    Type 1 diabetes mellitus (HCC)    Vitamin D deficiency    Patient Active Problem List   Diagnosis Date Noted   Chronic kidney disease,  stage 4 (severe) (HCC) 09/30/2022   Dark urine 09/22/2022   Unstable angina (HCC) 09/07/2022   Chronic idiopathic constipation 09/01/2022   Hyperphosphatemia 01/09/2022   Genetic testing 11/07/2021   Family history of breast cancer 10/23/2021   GAD (generalized anxiety disorder) 07/11/2021   Mixed hyperlipidemia 07/11/2021   S/P mitral valve clip implantation 05/18/2021   History of cardiac arrest 02/06/2021   Impaired mobility and ADLs 01/20/2021   Left-sided weakness 01/20/2021   Drug-induced myopathy 01/03/2021   Secondary hyperparathyroidism of renal origin (HCC) 11/18/2020   Vitamin D insufficiency 11/18/2020   Contrast dye  induced nephropathy, possible 09/03/2020   OSA on CPAP 09/03/2020   Need for COVID-19 vaccine 06/28/2020   Medicare annual wellness visit, subsequent 06/13/2020   Chronic respiratory failure with hypoxia (HCC) 03/23/2020   Chronic gout without tophus 03/23/2020   Pain in both hands 03/23/2020   Uncomplicated opioid dependence (HCC) 03/23/2020   Depression, major, recurrent, mild (HCC) 12/21/2019   Hypomagnesemia 12/21/2019   Lumbar back pain 10/05/2019   Diabetic nephropathy associated with type 2 diabetes mellitus (HCC) 10/05/2019   Hypertensive heart and renal disease with congestive heart failure (HCC) 09/18/2019   Demand ischemia    Diastolic heart failure (HCC) 11/07/2017   Class 2 severe obesity due to excess calories with serious comorbidity and body mass index (BMI) of 39.0 to 39.9 in adult [facility]) 11/07/2017   Coronary artery disease of native artery of native heart with stable angina pectoris (HCC) 12/25/2016   Hyperlipidemia LDL goal <70 12/14/2016   Anemia in stage 4 chronic kidney disease (HCC) 03/23/2016   CAD (coronary artery disease) 03/23/2016   Chronic pain syndrome 03/23/2016   GERD without esophagitis 03/23/2016   On home oxygen therapy 03/23/2016   S/P ablation of atrial fibrillation 10/04/2015   Diabetic polyneuropathy associated with type 2 diabetes mellitus (HCC) 04/02/2014   Home Medication(s) Prior to Admission medications   Medication Sig Start Date End Date Taking? Authorizing Provider  allopurinol (ZYLOPRIM) 300 MG tablet Take 1 tablet (300 mg total) by mouth daily. 07/05/22   Blane Ohara, MD  aspirin EC 81 MG tablet Take 1 tablet (81 mg total) by mouth daily. Swallow whole. 09/09/22   Cyndi Bender, NP  busPIRone (BUSPAR) 5 MG tablet Take 1 tablet (5 mg total) by mouth 3 (three) times daily. 10/09/22   Cox, Fritzi Mandes, MD  carvedilol (COREG) 12.5 MG tablet TAKE ONE TABLET BY MOUTH TWICE DAILY Patient taking differently: Take 12.5 mg by mouth 2 (two) times daily  with a meal. 01/09/22   Milford, Anderson Malta, FNP  cephALEXin (KEFLEX) 250 MG capsule Take 250 mg by mouth at bedtime. 11/30/22   [provider]  clopidogrel (PLAVIX) 75 MG tablet Take 1 tablet (75 mg total) by mouth daily. 07/05/22   CoxFritzi Mandes, MD  Continuous Blood Gluc Sensor (FREESTYLE LIBRE 2 SENSOR) MISC USE TO check blood glucose AS DIRECTED AND CHANGE sensor every 14 DAYS 01/11/22   [provider]  CRANBERRY PO Take 2 tablets by mouth daily.    [provider]  EPINEPHrine 0.3 mg/0.3 mL IJ SOAJ injection Use as directed for life-threatening allergic reaction. 06/24/18   Marcelyn Bruins, MD  estradiol (ESTRACE) 0.1 MG/GM vaginal cream Place 1 Applicatorful vaginally every other day. 11/27/22   [provider]  Evolocumab (REPATHA SURECLICK) 140 MG/ML SOAJ Inject 1 Dose into the skin every 14 (fourteen) days. 01/17/22   Hilty, Lisette Abu, MD  ezetimibe (ZETIA) 10 MG tablet Take  1 tablet (10 mg total) by mouth daily. 11/05/22   CoxFritzi Mandes, MD  hydrALAZINE (APRESOLINE) 25 MG tablet Take 1 tablet (25 mg total) by mouth 3 (three) times daily. 09/21/22   Milford, Anderson Malta, FNP  HYDROcodone-acetaminophen (NORCO) 7.5-325 MG tablet TAKE ONE TABLET BY MOUTH THREE TIMES DAILY AS NEEDED 11/28/22   Cox, Fritzi Mandes, MD  insulin aspart (NOVOLOG FLEXPEN) 100 UNIT/ML FlexPen 2-10 U before meals based on SSI. 10/09/22   Cox, Kirsten, MD  Insulin Pen Needle (BD PEN NEEDLE NANO 2ND GEN) 32G X 4 MM MISC Inject 1 each into the skin in the morning, at noon, in the evening, and at bedtime. 08/30/21   CoxFritzi Mandes, MD  isosorbide mononitrate (IMDUR) 30 MG 24 hr tablet Take 1 tablet (30 mg total) by mouth daily. 07/05/22   Cox, Fritzi Mandes, MD  latanoprost (XALATAN) 0.005 % ophthalmic solution Place 1 drop into both eyes at bedtime.    [provider]  LEVEMIR FLEXPEN 100 UNIT/ML FlexPen Inject 50 units into THE SKIN daily Patient taking differently: Inject 50 Units into the skin  daily. 07/30/22   Blane Ohara, MD  linaclotide Karlene Einstein) 145 MCG CAPS capsule Take 1 capsule (145 mcg total) by mouth daily before breakfast. 08/31/22   Cox, Kirsten, MD  LORazepam (ATIVAN) 0.5 MG tablet TAKE ONE TABLET BY MOUTH ONCE DAILY AS NEEDED FOR ANXIETY 10/21/22   Cox, Kirsten, MD  magnesium oxide (MAG-OX) 400 MG tablet Take 2 tablets (800 mg total) by mouth daily. Take 2 (400 mg) daily=800 mg 04/09/18   Graciella Freer, PA-C  nitroGLYCERIN (NITROSTAT) 0.4 MG SL tablet Dissolve 1 tab under tongue as needed for chest pain. May repeat every 5 minutes x 2 doses. If no relief call 9-1-1. Please call [phone] to schedule an appointment for future refills. Thank you. 07/13/22   Janetta Hora, PA-C  Omega-3 Fatty Acids (FISH OIL PO) Take 1 capsule by mouth daily.    [provider]  OXYGEN Inhale 2 L into the lungs at bedtime.    [provider]  pantoprazole (PROTONIX) 40 MG tablet Take 1 tablet (40 mg total) by mouth daily. 07/05/22   Cox, Fritzi Mandes, MD  potassium chloride SA (KLOR-CON M) 20 MEQ tablet TAKE ONE TABLET BY MOUTH ONCE daily 07/06/22   Cox, Fritzi Mandes, MD  Probiotic CAPS Take 1 capsule by mouth daily.    [provider]  promethazine (PHENERGAN) 25 MG tablet Take 1 tablet (25 mg total) by mouth daily as needed. 10/22/22   Cox, Fritzi Mandes, MD  sulfamethoxazole-trimethoprim (BACTRIM DS) 800-160 MG tablet Take 1 tablet by mouth 2 (two) times daily. 11/30/22   [provider]  torsemide (DEMADEX) 20 MG tablet TAKE 4 TABLETS BY MOUTH ONCE DAILY 04/09/22   Bensimhon, Bevelyn Buckles, MD  Past Surgical History Past Surgical History:  Procedure Laterality Date   ABDOMINAL HYSTERECTOMY     Partial- Still has both ovaries   CARDIOVASCULAR STRESS TEST  08/13/2014   Nuclear; Normal   CARPAL TUNNEL RELEASE     CATARACT EXTRACTION,  BILATERAL     CESAREAN SECTION     CHOLECYSTECTOMY     CORONARY ANGIOPLASTY WITH STENT PLACEMENT     3 blockages/ 1 stent   CORONARY PRESSURE/FFR STUDY N/A 09/07/2022   Procedure: INTRAVASCULAR PRESSURE WIRE/FFR STUDY;  Surgeon: Swaziland, Peter M, MD;  Location: [facility] INVASIVE CV LAB;  Service: Cardiovascular;  Laterality: N/A;   CORONARY STENT INTERVENTION N/A 09/29/2020   Procedure: CORONARY STENT INTERVENTION;  Surgeon: Tonny Bollman, MD;  Location: [facility] INVASIVE CV LAB;  Service: Cardiovascular;  Laterality: N/A;  CFX   CORONARY STENT INTERVENTION N/A 09/07/2022   Procedure: CORONARY STENT INTERVENTION;  Surgeon: Swaziland, Peter M, MD;  Location: [facility] INVASIVE CV LAB;  Service: Cardiovascular;  Laterality: N/A;   EYE SURGERY     LEFT HEART CATH AND CORONARY ANGIOGRAPHY N/A 09/29/2020   Procedure: LEFT HEART CATH AND CORONARY ANGIOGRAPHY;  Surgeon: Tonny Bollman, MD;  Location: [facility] INVASIVE CV LAB;  Service: Cardiovascular;  Laterality: N/A;   LEFT HEART CATH AND CORONARY ANGIOGRAPHY N/A 09/07/2022   Procedure: LEFT HEART CATH AND CORONARY ANGIOGRAPHY;  Surgeon: Swaziland, Peter M, MD;  Location: [facility] INVASIVE CV LAB;  Service: Cardiovascular;  Laterality: N/A;   MITRAL VALVE REPAIR N/A 05/18/2021   Procedure: MITRAL VALVE REPAIR;  Surgeon: Orbie Pyo, MD;  Location: [facility] INVASIVE CV LAB;  Service: Cardiovascular;  Laterality: N/A;   RIGHT HEART CATH N/A 04/27/2021   Procedure: RIGHT HEART CATH;  Surgeon: Dolores Patty, MD;  Location: [facility] INVASIVE CV LAB;  Service: Cardiovascular;  Laterality: N/A;   RIGHT/LEFT HEART CATH AND CORONARY ANGIOGRAPHY N/A 01/07/2017   Procedure: Right/Left Heart Cath and Coronary Angiography;  Surgeon: Laurey Morale, MD;  Location: [facility] INVASIVE CV LAB;  Service: Cardiovascular;  Laterality: N/A;   ROTATOR CUFF REPAIR     TEE WITHOUT CARDIOVERSION N/A 04/28/2021   Procedure: TRANSESOPHAGEAL ECHOCARDIOGRAM (TEE);  Surgeon: Dolores Patty, MD;  Location: [facility] ENDOSCOPY;   Service: Cardiovascular;  Laterality: N/A;   TEE WITHOUT CARDIOVERSION N/A 05/18/2021   Procedure: TRANSESOPHAGEAL ECHOCARDIOGRAM (TEE);  Surgeon: Orbie Pyo, MD;  Location: [facility] INVASIVE CV LAB;  Service: Cardiovascular;  Laterality: N/A;   TOE AMPUTATION Left    US ECHOCARDIOGRAPHY  05/2017   Normal   Family History Family History  Problem Relation Age of Onset   Breast cancer Mother 51       contralateral breast ca dx 39   Coronary artery disease Mother    Hypertension Mother    Hyperlipidemia Mother    Diabetes type II Mother    Lung cancer Mother 21   Diabetes Father    Hypertension Father    Mental illness Sister    Bipolar disorder Other    Depression Other    Schizophrenia Other    Cancer Other        MGF's sisters x2; unknown type    Social History Social History   Tobacco Use   Smoking status: Former    Types: Cigarettes    Start date: 1983    Quit date: 1984    Years since quitting: 40.4    Passive exposure: Past   Smokeless tobacco: Never  Vaping Use   Vaping Use: Never used  Substance  Use Topics   Alcohol use: Never   Drug use: Never   Allergies Naproxen, Celecoxib, Aspirin, Glucosamine, Iron, Metoclopramide, Metolazone, Other, Shellfish-derived products, Statins, Strawberry (diagnostic), Strawberry extract, Strawberry flavor, Strawberry leaves extract, and Tramadol  Review of Systems Review of Systems  Cardiovascular:  Positive for chest pain.    Physical Exam Vital Signs  I have reviewed the triage vital signs BP (!) 125/53   Pulse 75   Temp 97.7 F (36.5 C) (Oral)   Resp 20   Ht 5' 4.5" (1.638 m)   Wt 109.8 kg   SpO2 92%   BMI 40.90 kg/m   Physical Exam Vitals and nursing note reviewed.  Constitutional:      General: She is not in acute distress.    Appearance: She is well-developed.  HENT:     Head: Normocephalic and atraumatic.  Eyes:     Conjunctiva/sclera: Conjunctivae normal.  Cardiovascular:     Rate and Rhythm:  Normal rate and regular rhythm.     Heart sounds: No murmur heard. Pulmonary:     Effort: Pulmonary effort is normal. No respiratory distress.     Breath sounds: Normal breath sounds.  Abdominal:     Palpations: Abdomen is soft.     Tenderness: There is no abdominal tenderness.  Musculoskeletal:        General: No swelling.     Cervical back: Neck supple.  Skin:    General: Skin is warm and dry.     Capillary Refill: Capillary refill takes less than 2 seconds.  Neurological:     Mental Status: She is alert.  Psychiatric:        Mood and Affect: Mood normal.     ED Results and Treatments Labs (all labs ordered are listed, but only abnormal results are displayed) Labs Reviewed  COMPREHENSIVE METABOLIC PANEL - Abnormal; Notable for the following components:      Result Value   Glucose, Bld 213 (*)    BUN 65 (*)    Creatinine, Ser 2.73 (*)    Calcium 8.8 (*)    Albumin 3.2 (*)    Alkaline Phosphatase 139 (*)    GFR, Estimated 19 (*)    All other components within normal limits  CBC WITH DIFFERENTIAL/PLATELET - Abnormal; Notable for the following components:   WBC 17.3 (*)    RBC 3.50 (*)    Hemoglobin 9.3 (*)    HCT 29.7 (*)    Neutro Abs 14.9 (*)    All other components within normal limits  URINALYSIS, COMPLETE (UACMP) WITH MICROSCOPIC - Abnormal; Notable for the following components:   APPearance CLOUDY (*)    Leukocytes,Ua TRACE (*)    Bacteria, UA MANY (*)    All other components within normal limits  LACTIC ACID, PLASMA  LACTIC ACID, PLASMA  TROPONIN I (HIGH SENSITIVITY)  TROPONIN I (HIGH SENSITIVITY)  Radiology DG Chest Portable 1 View  Result Date: 12/06/2022 CLINICAL DATA:  Left chest pain radiating to the left arm EXAM: PORTABLE CHEST 1 VIEW COMPARISON:  09/07/2022 FINDINGS: Mitral clips are obscured by overlying ECG lead. Coronary  atherosclerosis noted. A small device projecting over the left lower lobe pulmonary artery is compatible with a CardioMEMS type pulmonary arterial pressure monitor. The patient is rotated to the right on today's radiograph, reducing diagnostic sensitivity and specificity. Moderate cardiomegaly. Mild cephalization of blood flow, nonspecific on semi erect positioning, but pulmonary venous hypertension is not excluded. No overt edema. No blunting of the lateral costophrenic angles. Chronic deformity of the left proximal humerus likely from healed fracture. IMPRESSION: 1. Moderate cardiomegaly with mild cephalization of blood flow, nonspecific on semi erect positioning, but pulmonary venous hypertension is not excluded. No overt edema. 2. Mitraclips are obscured by overlying ECG lead. Pulmonary arterial pressure monitor noted. 3. Coronary atherosclerosis. 4. Chronic deformity of the left proximal humerus likely from healed fracture. Electronically Signed   By: Gaylyn Rong M.D.   On: 12/06/2022 11:37    Pertinent labs & imaging results that were available during my care of the patient were reviewed by me and considered in my medical decision making (see MDM for details).  Medications Ordered in ED Medications  lactated ringers bolus 500 mL (0 mLs Intravenous Stopped 12/06/22 1352)  HYDROcodone-acetaminophen (NORCO) 10-325 MG per tablet 1 tablet (1 tablet Oral Given 12/06/22 1303)  nitroGLYCERIN (NITROGLYN) 2 % ointment 1 inch (1 inch Topical Given 12/06/22 1456)                                                                                                                                     Procedures .Critical Care  Performed by: Glendora Score, MD Authorized by: Glendora Score, MD   Critical care provider statement:    Critical care time (minutes):  30   Critical care was time spent personally by me on the following activities:  Development of treatment plan with patient or surrogate,  discussions with consultants, evaluation of patient's response to treatment, examination of patient, ordering and review of laboratory studies, ordering and review of radiographic studies, ordering and performing treatments and interventions, pulse oximetry, re-evaluation of patient's condition and review of old charts   (including critical care time)  Medical Decision Making / ED Course   This patient presents to the ED for concern of chest pain, this involves an extensive number of treatment options, and is a complaint that carries with it a high risk of complications and morbidity.  The differential diagnosis includes ACS, Aortic Dissection, Pneumothorax, Pneumonia, Esophageal Rupture, PE, Tamponade/Pericardial Effusion, pericarditis, esophageal spasm, dysrhythmia, GERD, costochondritis.  MDM: Patient seen emergency room for evaluation of chest pain.  Physical exam is largely unremarkable outside of patient's known cardiac murmur, however she does appear uncomfortable.  Laboratory evaluation with a leukocytosis of 17.3, hemoglobin 9.3, BUN 65,  creatinine 2.73 which is an elevation for this patient.  Chest x-ray with some cardiomegaly but no acute pathology.  ECG non-ischemic. High sensitivity troponin and delta troponin unremarkable.  Urinalysis with trace leuk esterase, 11-20 white blood cells, many bacteria but 6-10 squamous epithelial cells.  Patient has no urinary symptoms and as her leukocytosis appears to be fairly chronic we will hold on empiric antibiotics and send for urine culture.  I consulted the cardiologist on-call Dr. Servando Salina and the cardiology team came to evaluate the patient we will continue to follow and is recommending medical admission for her other medical comorbidities.  Patient's chest pain did return and 1 inch of Nitropaste was ordered.  Patient been admitted to the hospital service.   Additional history obtained: -Additional history obtained from daughter -External records  from outside source obtained and reviewed including: Chart review including previous notes, labs, imaging, consultation notes   Lab Tests: -I ordered, reviewed, and interpreted labs.   The pertinent results include:   Labs Reviewed  COMPREHENSIVE METABOLIC PANEL - Abnormal; Notable for the following components:      Result Value   Glucose, Bld 213 (*)    BUN 65 (*)    Creatinine, Ser 2.73 (*)    Calcium 8.8 (*)    Albumin 3.2 (*)    Alkaline Phosphatase 139 (*)    GFR, Estimated 19 (*)    All other components within normal limits  CBC WITH DIFFERENTIAL/PLATELET - Abnormal; Notable for the following components:   WBC 17.3 (*)    RBC 3.50 (*)    Hemoglobin 9.3 (*)    HCT 29.7 (*)    Neutro Abs 14.9 (*)    All other components within normal limits  URINALYSIS, COMPLETE (UACMP) WITH MICROSCOPIC - Abnormal; Notable for the following components:   APPearance CLOUDY (*)    Leukocytes,Ua TRACE (*)    Bacteria, UA MANY (*)    All other components within normal limits  LACTIC ACID, PLASMA  LACTIC ACID, PLASMA  TROPONIN I (HIGH SENSITIVITY)  TROPONIN I (HIGH SENSITIVITY)      EKG   EKG Interpretation  Date/Time:  Thursday Dec 06 2022 10:39:02 EDT Ventricular Rate:  77 PR Interval:  173 QRS Duration: 106 QT Interval:  433 QTC Calculation: 491 R Axis:   66 Text Interpretation: Sinus rhythm Low voltage, precordial leads Confirmed by Oscar Hank ([id number]) on 12/06/2022 7:12:28 PM         Imaging Studies ordered: I ordered imaging studies including chest x-ray I independently visualized and interpreted imaging. I agree with the radiologist interpretation   Medicines ordered and prescription drug management: Meds ordered this encounter  Medications   lactated ringers bolus 500 mL   DISCONTD: HYDROcodone-acetaminophen (NORCO) 7.5-325 MG per tablet 1 tablet   HYDROcodone-acetaminophen (NORCO) 10-325 MG per tablet 1 tablet   nitroGLYCERIN (NITROGLYN) 2 % ointment 1 inch     -I have reviewed the patients home medicines and have made adjustments as needed  Critical interventions none  Consultations Obtained: I requested consultation with the cardiologist on-call Dr. Servando Salina,  and discussed lab and imaging findings as well as pertinent plan - they recommend: Medical admission   Cardiac Monitoring: The patient was maintained on a cardiac monitor.  I personally viewed and interpreted the cardiac monitored which showed an underlying rhythm of: NSR  Social Determinants of Health:  Factors impacting patients care include: none   Reevaluation: After the interventions noted above, I reevaluated the patient and found that they  have :improved  Co morbidities that complicate the patient evaluation  Past Medical History:  Diagnosis Date   Acquired absence of left great toe (HCC)    Acquired absence of other left toe(s) (HCC)    ACS (acute coronary syndrome) (HCC)    Anemia    Anoxic brain injury (HCC)    Anxiety    CAD (coronary artery disease)    DES mLAD 04/07/15; DES dRCA & DES pPDA 08/30/16; DES mCX & PTCA OM1 09/29/20   Chronic diastolic (congestive) heart failure (HCC)    Chronic pain    Chronic systolic (congestive) heart failure (HCC)    CKD (chronic kidney disease)    Contrast dye induced nephropathy, possible 09/03/2020   COPD (chronic obstructive pulmonary disease) (HCC)    Diabetes (HCC)type 1    Diastolic CHF (HCC)    Dyspnea    Family history of breast cancer 10/23/2021   Gastro-esophageal reflux disease without esophagitis    Hyperlipidemia    Hypertension    Hypertensive heart disease with heart failure (HCC)    Hypoxemia    Idiopathic gout, multiple sites    Leukocytosis 03/29/2022   Localized edema    Low back pain    Mitral regurgitation    Mitral regurgitation    Mixed hyperlipidemia    Mixed incontinence    Morbid (severe) obesity due to excess calories (HCC)    Neuromuscular disorder (HCC)    Neuropathy    Non-ST elevation  (NSTEMI) myocardial infarction (HCC)    08/29/20, 09/29/20   OSA (obstructive sleep apnea) 09/03/2020   Other specified hypothyroidism    PAF (paroxysmal atrial fibrillation) (HCC)    s/p pulmonary vein isolation by cryoablation 10/04/15 Dr. Rudolpho Sevin in [facility]   PEA (Pulseless electrical activity) [facility])    ~ 01/13/21 at [facility] during admission DKA, volume overload, possible CAP, hypoxia s/p ACLS with ROSC ~ 10-15   Pneumonia    Primary insomnia    Rheumatoid arthritis (HCC)    Stroke (HCC)    Thyroid disease    Type 1 diabetes mellitus (HCC)    Vitamin D deficiency       Dispostion: I considered admission for this patient, and due to high risk chest pain patient require hospital admission     Final Clinical Impression(s) / ED Diagnoses Final diagnoses:  Chest pain, unspecified type     @PCDICTATION @    Glendora Score, MD 12/06/22 [phone]

## 2022-12-06 NOTE — ED Notes (Signed)
ED TO INPATIENT HANDOFF REPORT  ED Nurse Name and Phone #: Victorino Dike [phone]  S Name/Age/Gender Dana [name] 62 y.o. female Room/Bed: 028C/028C  Code Status   Code Status: Full Code  Home/SNF/Other Home Patient oriented to: self, place, time, and situation Is this baseline? Yes   Triage Complete: Triage complete  Chief Complaint Unstable angina (HCC) [I20.0]  Triage Note Pt to ER via EMS with reports of left sided chest pain that started around 0830 this AM and radiates into left arm.  Pt states this pain is similar to previous MI.  Pt took 3 SL NTG at home with decrease in pain.  Pt states nausea, denies vomiting or diaphoresis.  Pt also reports pain to left shoulder blade.   Allergies Allergies  Allergen Reactions   Naproxen Hives and Rash   Celecoxib Other (See Comments)    Stomach pain   Aspirin Other (See Comments)    CAN NOT TAKE DUE TO ULCERS (documented previously) but takes every day currently (as of 09/29/20) without reported issues Can take coated aspirin   Glucosamine Hives, Swelling and Other (See Comments)    Angioedema    Iron     IV iron transfusion - hives - Feb 2022 hospitalization   Metoclopramide Other (See Comments)    Facial twitching and stuttering   Metolazone Other (See Comments)    Acute renal failure   Other Hives    Strawberry allergy   Shellfish-Derived Products Hives, Swelling and Other (See Comments)    Angioedema    Statins Other (See Comments)    Muscle pain   Strawberry (Diagnostic)    Strawberry Extract    Strawberry Flavor    Strawberry Leaves Extract    Tramadol Nausea And Vomiting         Level of Care/Admitting Diagnosis ED Disposition     ED Disposition  Admit   Condition  --   Comment  Hospital Area: [facility] [[id number]]  Level of Care: Telemetry Cardiac [[id number]]  May place patient in observation at [facility] or [facility] if equivalent level of care is available:: No  Covid Evaluation:  Asymptomatic - no recent exposure (last 10 days) testing not required  Diagnosis: Unstable angina [facility]) [[id number]]  Admitting Physician: Synetta Fail [[id number]]  Attending Physician: Synetta Fail [phone]          B Medical/Surgery History Past Medical History:  Diagnosis Date   Acquired absence of left great toe (HCC)    Acquired absence of other left toe(s) (HCC)    ACS (acute coronary syndrome) (HCC)    Anemia    Anoxic brain injury (HCC)    Anxiety    CAD (coronary artery disease)    DES mLAD 04/07/15; DES dRCA & DES pPDA 08/30/16; DES mCX & PTCA OM1 09/29/20   Chronic diastolic (congestive) heart failure (HCC)    Chronic pain    Chronic systolic (congestive) heart failure (HCC)    CKD (chronic kidney disease)    Contrast dye induced nephropathy, possible 09/03/2020   COPD (chronic obstructive pulmonary disease) (HCC)    Diabetes (HCC)type 1    Diastolic CHF (HCC)    Dyspnea    Family history of breast cancer 10/23/2021   Gastro-esophageal reflux disease without esophagitis    Hyperlipidemia    Hypertension    Hypertensive heart disease with heart failure (HCC)    Hypoxemia    Idiopathic gout, multiple sites    Leukocytosis 03/29/2022   Localized  edema    Low back pain    Mitral regurgitation    Mitral regurgitation    Mixed hyperlipidemia    Mixed incontinence    Morbid (severe) obesity due to excess calories (HCC)    Neuromuscular disorder (HCC)    Neuropathy    Non-ST elevation (NSTEMI) myocardial infarction (HCC)    08/29/20, 09/29/20   OSA (obstructive sleep apnea) 09/03/2020   Other specified hypothyroidism    PAF (paroxysmal atrial fibrillation) (HCC)    s/p pulmonary vein isolation by cryoablation 10/04/15 Dr. Rudolpho Sevin in [facility]   PEA (Pulseless electrical activity) [facility])    ~ 01/13/21 at [facility] during admission DKA, volume overload, possible CAP, hypoxia s/p ACLS with ROSC ~ 10-15   Pneumonia    Primary insomnia    Rheumatoid  arthritis (HCC)    Stroke (HCC)    Thyroid disease    Type 1 diabetes mellitus (HCC)    Vitamin D deficiency    Past Surgical History:  Procedure Laterality Date   ABDOMINAL HYSTERECTOMY     Partial- Still has both ovaries   CARDIOVASCULAR STRESS TEST  08/13/2014   Nuclear; Normal   CARPAL TUNNEL RELEASE     CATARACT EXTRACTION, BILATERAL     CESAREAN SECTION     CHOLECYSTECTOMY     CORONARY ANGIOPLASTY WITH STENT PLACEMENT     3 blockages/ 1 stent   CORONARY PRESSURE/FFR STUDY N/A 09/07/2022   Procedure: INTRAVASCULAR PRESSURE WIRE/FFR STUDY;  Surgeon: Swaziland, Peter M, MD;  Location: [facility] INVASIVE CV LAB;  Service: Cardiovascular;  Laterality: N/A;   CORONARY STENT INTERVENTION N/A 09/29/2020   Procedure: CORONARY STENT INTERVENTION;  Surgeon: Tonny Bollman, MD;  Location: [facility] INVASIVE CV LAB;  Service: Cardiovascular;  Laterality: N/A;  CFX   CORONARY STENT INTERVENTION N/A 09/07/2022   Procedure: CORONARY STENT INTERVENTION;  Surgeon: Swaziland, Peter M, MD;  Location: [facility] INVASIVE CV LAB;  Service: Cardiovascular;  Laterality: N/A;   EYE SURGERY     LEFT HEART CATH AND CORONARY ANGIOGRAPHY N/A 09/29/2020   Procedure: LEFT HEART CATH AND CORONARY ANGIOGRAPHY;  Surgeon: Tonny Bollman, MD;  Location: [facility] INVASIVE CV LAB;  Service: Cardiovascular;  Laterality: N/A;   LEFT HEART CATH AND CORONARY ANGIOGRAPHY N/A 09/07/2022   Procedure: LEFT HEART CATH AND CORONARY ANGIOGRAPHY;  Surgeon: Swaziland, Peter M, MD;  Location: [facility] INVASIVE CV LAB;  Service: Cardiovascular;  Laterality: N/A;   MITRAL VALVE REPAIR N/A 05/18/2021   Procedure: MITRAL VALVE REPAIR;  Surgeon: Orbie Pyo, MD;  Location: [facility] INVASIVE CV LAB;  Service: Cardiovascular;  Laterality: N/A;   RIGHT HEART CATH N/A 04/27/2021   Procedure: RIGHT HEART CATH;  Surgeon: Dolores Patty, MD;  Location: [facility] INVASIVE CV LAB;  Service: Cardiovascular;  Laterality: N/A;   RIGHT/LEFT HEART CATH AND CORONARY ANGIOGRAPHY N/A 01/07/2017    Procedure: Right/Left Heart Cath and Coronary Angiography;  Surgeon: Laurey Morale, MD;  Location: [facility] INVASIVE CV LAB;  Service: Cardiovascular;  Laterality: N/A;   ROTATOR CUFF REPAIR     TEE WITHOUT CARDIOVERSION N/A 04/28/2021   Procedure: TRANSESOPHAGEAL ECHOCARDIOGRAM (TEE);  Surgeon: Dolores Patty, MD;  Location: [facility] ENDOSCOPY;  Service: Cardiovascular;  Laterality: N/A;   TEE WITHOUT CARDIOVERSION N/A 05/18/2021   Procedure: TRANSESOPHAGEAL ECHOCARDIOGRAM (TEE);  Surgeon: Orbie Pyo, MD;  Location: [facility] INVASIVE CV LAB;  Service: Cardiovascular;  Laterality: N/A;   TOE AMPUTATION Left    US ECHOCARDIOGRAPHY  05/2017   Normal     A  IV Location/Drains/Wounds Patient Lines/Drains/Airways Status     Active Line/Drains/Airways     Name Placement date Placement time Site Days   Peripheral IV 12/06/22 Right Antecubital 12/06/22  1036  Antecubital  less than 1            Intake/Output Last 24 hours No intake or output data in the 24 hours ending 12/06/22 1639  Labs/Imaging Results for orders placed or performed during the hospital encounter of 12/06/22 (from the past 48 hour(s))  Comprehensive metabolic panel     Status: Abnormal   Collection Time: 12/06/22 10:45 AM  Result Value Ref Range   Sodium 135 135 - 145 mmol/L   Potassium 4.1 3.5 - 5.1 mmol/L   Chloride 99 98 - 111 mmol/L   CO2 23 22 - 32 mmol/L   Glucose, Bld 213 (H) 70 - 99 mg/dL    Comment: Glucose reference range applies only to samples taken after fasting for at least 8 hours.   BUN 65 (H) 6 - 20 mg/dL   Creatinine, Ser 8.29 (H) 0.44 - 1.00 mg/dL   Calcium 8.8 (L) 8.9 - 10.3 mg/dL   Total Protein 7.3 6.5 - 8.1 g/dL   Albumin 3.2 (L) 3.5 - 5.0 g/dL   AST 17 15 - 41 U/L   ALT 18 0 - 44 U/L   Alkaline Phosphatase 139 (H) 38 - 126 U/L   Total Bilirubin 0.5 0.3 - 1.2 mg/dL   GFR, Estimated 19 (L) >60 mL/min    Comment: (NOTE) Calculated using the CKD-EPI Creatinine Equation (2021)    Anion gap  13 5 - 15    Comment: Performed at [facility], [address] [place], [place] [address]  Troponin I (High Sensitivity)     Status: None   Collection Time: 12/06/22 10:45 AM  Result Value Ref Range   Troponin I (High Sensitivity) 8 <18 ng/L    Comment: (NOTE) Elevated high sensitivity troponin I (hsTnI) values and significant  changes across serial measurements may suggest ACS but many other  chronic and acute conditions are known to elevate hsTnI results.  Refer to the "Links" section for chest pain algorithms and additional  guidance. Performed at [facility], [address] [place], [place] [address]   CBC with Differential     Status: Abnormal   Collection Time: 12/06/22 10:45 AM  Result Value Ref Range   WBC 17.3 (H) 4.0 - 10.5 K/uL   RBC 3.50 (L) 3.87 - 5.11 MIL/uL   Hemoglobin 9.3 (L) 12.0 - 15.0 g/dL   HCT 84.6 (L) 96.2 - 95.2 %   MCV 84.9 80.0 - 100.0 fL   MCH 26.6 26.0 - 34.0 pg   MCHC 31.3 30.0 - 36.0 g/dL   RDW 84.1 32.4 - 40.1 %   Platelets 235 150 - 400 K/uL   nRBC 0.0 0.0 - 0.2 %   Neutrophils Relative % 86 %   Neutro Abs 14.9 (H) 1.7 - 7.7 K/uL   Lymphocytes Relative 7 %   Lymphs Abs 1.2 0.7 - 4.0 K/uL   Monocytes Relative 6 %   Monocytes Absolute 1.0 0.1 - 1.0 K/uL   Eosinophils Relative 1 %   Eosinophils Absolute 0.2 0.0 - 0.5 K/uL   Basophils Relative 0 %   Basophils Absolute 0.1 0.0 - 0.1 K/uL   Immature Granulocytes 0 %   Abs Immature Granulocytes 0.07 0.00 - 0.07 K/uL    Comment: Performed at [facility],  [address], [place], [place] [address]  Troponin I (High Sensitivity)     Status: None   Collection Time: 12/06/22  1:04 PM  Result Value Ref Range   Troponin I (High Sensitivity) 9 <18 ng/L    Comment: (NOTE) Elevated high sensitivity troponin I (hsTnI) values and significant  changes across serial measurements may suggest ACS but many other  chronic and acute conditions are known to elevate hsTnI results.  Refer to  the "Links" section for chest pain algorithms and additional  guidance. Performed at [facility], [address], [place], [place] [address]   Urinalysis, Complete w Microscopic -Urine, Clean Catch     Status: Abnormal   Collection Time: 12/06/22  1:44 PM  Result Value Ref Range   Color, Urine YELLOW YELLOW   APPearance CLOUDY (A) CLEAR   Specific Gravity, Urine 1.008 1.005 - 1.030   pH 5.0 5.0 - 8.0   Glucose, UA NEGATIVE NEGATIVE mg/dL   Hgb urine dipstick NEGATIVE NEGATIVE   Bilirubin Urine NEGATIVE NEGATIVE   Ketones, ur NEGATIVE NEGATIVE mg/dL   Protein, ur NEGATIVE NEGATIVE mg/dL   Nitrite NEGATIVE NEGATIVE   Leukocytes,Ua TRACE (A) NEGATIVE   RBC / HPF 0-5 0 - 5 RBC/hpf   WBC, UA 11-20 0 - 5 WBC/hpf   Bacteria, UA MANY (A) NONE SEEN   Squamous Epithelial / HPF 6-10 0 - 5 /HPF   Mucus PRESENT     Comment: Performed at [facility], [address], [place], [place] [address]  Lactic acid, plasma     Status: None   Collection Time: 12/06/22  2:56 PM  Result Value Ref Range   Lactic Acid, Venous 1.4 0.5 - 1.9 mmol/L    Comment: Performed at [facility], [address], [place], [place] [address]   DG Chest Portable 1 View  Result Date: 12/06/2022 CLINICAL DATA:  Left chest pain radiating to the left arm EXAM: PORTABLE CHEST 1 VIEW COMPARISON:  09/07/2022 FINDINGS: Mitral clips are obscured by overlying ECG lead. Coronary atherosclerosis noted. A small device projecting over the left lower lobe pulmonary artery is compatible with a CardioMEMS type pulmonary arterial pressure monitor. The patient is rotated to the right on today's radiograph, reducing diagnostic sensitivity and specificity. Moderate cardiomegaly. Mild cephalization of blood flow, nonspecific on semi erect positioning, but pulmonary venous hypertension is not excluded. No overt edema. No blunting of the lateral costophrenic angles. Chronic deformity of the left proximal humerus likely from healed  fracture. IMPRESSION: 1. Moderate cardiomegaly with mild cephalization of blood flow, nonspecific on semi erect positioning, but pulmonary venous hypertension is not excluded. No overt edema. 2. Mitraclips are obscured by overlying ECG lead. Pulmonary arterial pressure monitor noted. 3. Coronary atherosclerosis. 4. Chronic deformity of the left proximal humerus likely from healed fracture. Electronically Signed   By: Gaylyn Rong M.D.   On: 12/06/2022 11:37    Pending Labs Unresulted Labs (From admission, onward)     Start     Ordered   12/07/22 0500  Basic metabolic panel  Tomorrow morning,   R        12/06/22 1620   12/07/22 0500  CBC  Tomorrow morning,   R        12/06/22 1620   12/06/22 1619  Brain natriuretic peptide  Once,   URGENT        12/06/22 1618   12/06/22 1606  Urinalysis, w/ Reflex to Culture (Infection Suspected) -Urine,  Clean Catch  (Urine Labs)  Once,   URGENT       Question:  Specimen Source  Answer:  Urine, Clean Catch   12/06/22 1605   12/06/22 1402  Lactic acid, plasma  STAT Now then every 3 hours,   R (with STAT occurrences)      12/06/22 1401            Vitals/Pain Today's Vitals   12/06/22 1415 12/06/22 1430 12/06/22 1445 12/06/22 1511  BP: (!) 128/57 (!) 129/58 (!) 125/53   Pulse: 71 70 75   Resp: 17 13 20    Temp:    97.7 F (36.5 C)  TempSrc:    Oral  SpO2: 94% 94% 92%   Weight:      Height:      PainSc:        Isolation Precautions No active isolations  Medications Medications  aspirin EC tablet 81 mg (has no administration in time range)  allopurinol (ZYLOPRIM) tablet 300 mg (has no administration in time range)  carvedilol (COREG) tablet 12.5 mg (has no administration in time range)  isosorbide mononitrate (IMDUR) 24 hr tablet 30 mg (has no administration in time range)  ezetimibe (ZETIA) tablet 10 mg (has no administration in time range)  LORazepam (ATIVAN) tablet 0.5 mg (has no administration in time range)  busPIRone (BUSPAR)  tablet 5 mg (has no administration in time range)  pantoprazole (PROTONIX) EC tablet 40 mg (has no administration in time range)  clopidogrel (PLAVIX) tablet 75 mg (has no administration in time range)  nitroGLYCERIN (NITROSTAT) SL tablet 0.4 mg (has no administration in time range)  acetaminophen (TYLENOL) tablet 650 mg (has no administration in time range)  ondansetron (ZOFRAN) injection 4 mg (has no administration in time range)  heparin injection 5,000 Units (has no administration in time range)  lactated ringers bolus 500 mL (0 mLs Intravenous Stopped 12/06/22 1352)  HYDROcodone-acetaminophen (NORCO) 10-325 MG per tablet 1 tablet (1 tablet Oral Given 12/06/22 1303)  nitroGLYCERIN (NITROGLYN) 2 % ointment 1 inch (1 inch Topical Given 12/06/22 1456)    Mobility walks     Focused Assessments Cardiac Assessment Handoff:    Lab Results  Component Value Date   TROPONINI 0.03 (HH) 11/07/2017   Lab Results  Component Value Date   DDIMER 1.24 (H) 02/11/2018   Does the Patient currently have chest pain? No    R Recommendations: See Admitting Provider Note  Report given to:   Additional Notes:

## 2022-12-07 DIAGNOSIS — Z8674 Personal history of sudden cardiac arrest: Secondary | ICD-10-CM | POA: Diagnosis not present

## 2022-12-07 DIAGNOSIS — Z954 Presence of other heart-valve replacement: Secondary | ICD-10-CM | POA: Diagnosis not present

## 2022-12-07 DIAGNOSIS — K5900 Constipation, unspecified: Secondary | ICD-10-CM | POA: Diagnosis not present

## 2022-12-07 DIAGNOSIS — I2510 Atherosclerotic heart disease of native coronary artery without angina pectoris: Secondary | ICD-10-CM | POA: Diagnosis not present

## 2022-12-07 DIAGNOSIS — G4733 Obstructive sleep apnea (adult) (pediatric): Secondary | ICD-10-CM | POA: Diagnosis not present

## 2022-12-07 DIAGNOSIS — N390 Urinary tract infection, site not specified: Secondary | ICD-10-CM | POA: Diagnosis not present

## 2022-12-07 DIAGNOSIS — Z8679 Personal history of other diseases of the circulatory system: Secondary | ICD-10-CM | POA: Diagnosis not present

## 2022-12-07 DIAGNOSIS — I471 Supraventricular tachycardia, unspecified: Secondary | ICD-10-CM | POA: Diagnosis not present

## 2022-12-07 DIAGNOSIS — J449 Chronic obstructive pulmonary disease, unspecified: Secondary | ICD-10-CM | POA: Diagnosis not present

## 2022-12-07 DIAGNOSIS — I130 Hypertensive heart and chronic kidney disease with heart failure and stage 1 through stage 4 chronic kidney disease, or unspecified chronic kidney disease: Secondary | ICD-10-CM | POA: Diagnosis not present

## 2022-12-07 DIAGNOSIS — N184 Chronic kidney disease, stage 4 (severe): Secondary | ICD-10-CM | POA: Diagnosis not present

## 2022-12-07 DIAGNOSIS — R0789 Other chest pain: Secondary | ICD-10-CM | POA: Diagnosis not present

## 2022-12-07 DIAGNOSIS — Z6841 Body Mass Index (BMI) 40.0 and over, adult: Secondary | ICD-10-CM | POA: Diagnosis not present

## 2022-12-07 DIAGNOSIS — R109 Unspecified abdominal pain: Secondary | ICD-10-CM | POA: Diagnosis not present

## 2022-12-07 DIAGNOSIS — I10 Essential (primary) hypertension: Secondary | ICD-10-CM | POA: Diagnosis not present

## 2022-12-07 DIAGNOSIS — E782 Mixed hyperlipidemia: Secondary | ICD-10-CM | POA: Diagnosis not present

## 2022-12-07 DIAGNOSIS — I5032 Chronic diastolic (congestive) heart failure: Secondary | ICD-10-CM | POA: Diagnosis not present

## 2022-12-07 DIAGNOSIS — I5033 Acute on chronic diastolic (congestive) heart failure: Secondary | ICD-10-CM | POA: Diagnosis not present

## 2022-12-07 DIAGNOSIS — E1043 Type 1 diabetes mellitus with diabetic autonomic (poly)neuropathy: Secondary | ICD-10-CM | POA: Diagnosis not present

## 2022-12-07 DIAGNOSIS — M069 Rheumatoid arthritis, unspecified: Secondary | ICD-10-CM | POA: Diagnosis not present

## 2022-12-07 DIAGNOSIS — N2581 Secondary hyperparathyroidism of renal origin: Secondary | ICD-10-CM | POA: Diagnosis not present

## 2022-12-07 DIAGNOSIS — N179 Acute kidney failure, unspecified: Secondary | ICD-10-CM | POA: Diagnosis not present

## 2022-12-07 DIAGNOSIS — I237 Postinfarction angina: Secondary | ICD-10-CM | POA: Diagnosis not present

## 2022-12-07 DIAGNOSIS — D631 Anemia in chronic kidney disease: Secondary | ICD-10-CM | POA: Diagnosis not present

## 2022-12-07 DIAGNOSIS — T82855A Stenosis of coronary artery stent, initial encounter: Secondary | ICD-10-CM | POA: Diagnosis not present

## 2022-12-07 DIAGNOSIS — Z95818 Presence of other cardiac implants and grafts: Secondary | ICD-10-CM | POA: Diagnosis not present

## 2022-12-07 DIAGNOSIS — F330 Major depressive disorder, recurrent, mild: Secondary | ICD-10-CM | POA: Diagnosis not present

## 2022-12-07 DIAGNOSIS — I200 Unstable angina: Secondary | ICD-10-CM | POA: Diagnosis not present

## 2022-12-07 DIAGNOSIS — I21A9 Other myocardial infarction type: Secondary | ICD-10-CM | POA: Diagnosis not present

## 2022-12-07 DIAGNOSIS — N1832 Chronic kidney disease, stage 3b: Secondary | ICD-10-CM | POA: Diagnosis not present

## 2022-12-07 DIAGNOSIS — R079 Chest pain, unspecified: Secondary | ICD-10-CM | POA: Diagnosis present

## 2022-12-07 DIAGNOSIS — Y831 Surgical operation with implant of artificial internal device as the cause of abnormal reaction of the patient, or of later complication, without mention of misadventure at the time of the procedure: Secondary | ICD-10-CM | POA: Diagnosis present

## 2022-12-07 DIAGNOSIS — I5043 Acute on chronic combined systolic (congestive) and diastolic (congestive) heart failure: Secondary | ICD-10-CM | POA: Diagnosis not present

## 2022-12-07 DIAGNOSIS — I214 Non-ST elevation (NSTEMI) myocardial infarction: Secondary | ICD-10-CM | POA: Diagnosis not present

## 2022-12-07 DIAGNOSIS — N170 Acute kidney failure with tubular necrosis: Secondary | ICD-10-CM | POA: Diagnosis not present

## 2022-12-07 DIAGNOSIS — F32A Depression, unspecified: Secondary | ICD-10-CM | POA: Diagnosis not present

## 2022-12-07 DIAGNOSIS — Z794 Long term (current) use of insulin: Secondary | ICD-10-CM | POA: Diagnosis not present

## 2022-12-07 DIAGNOSIS — R042 Hemoptysis: Secondary | ICD-10-CM | POA: Diagnosis not present

## 2022-12-07 DIAGNOSIS — Z9889 Other specified postprocedural states: Secondary | ICD-10-CM | POA: Diagnosis not present

## 2022-12-07 DIAGNOSIS — R0600 Dyspnea, unspecified: Secondary | ICD-10-CM | POA: Diagnosis not present

## 2022-12-07 DIAGNOSIS — I480 Paroxysmal atrial fibrillation: Secondary | ICD-10-CM | POA: Diagnosis not present

## 2022-12-07 DIAGNOSIS — D649 Anemia, unspecified: Secondary | ICD-10-CM | POA: Diagnosis not present

## 2022-12-07 DIAGNOSIS — J9621 Acute and chronic respiratory failure with hypoxia: Secondary | ICD-10-CM | POA: Diagnosis not present

## 2022-12-07 DIAGNOSIS — E038 Other specified hypothyroidism: Secondary | ICD-10-CM | POA: Diagnosis not present

## 2022-12-07 DIAGNOSIS — I959 Hypotension, unspecified: Secondary | ICD-10-CM | POA: Diagnosis not present

## 2022-12-07 DIAGNOSIS — I5030 Unspecified diastolic (congestive) heart failure: Secondary | ICD-10-CM | POA: Diagnosis not present

## 2022-12-07 DIAGNOSIS — E1051 Type 1 diabetes mellitus with diabetic peripheral angiopathy without gangrene: Secondary | ICD-10-CM | POA: Diagnosis not present

## 2022-12-07 DIAGNOSIS — E1159 Type 2 diabetes mellitus with other circulatory complications: Secondary | ICD-10-CM | POA: Diagnosis not present

## 2022-12-07 DIAGNOSIS — F411 Generalized anxiety disorder: Secondary | ICD-10-CM | POA: Diagnosis not present

## 2022-12-07 LAB — BASIC METABOLIC PANEL
Anion gap: 10 (ref 5–15)
BUN: 65 mg/dL — ABNORMAL HIGH (ref 6–20)
CO2: 25 mmol/L (ref 22–32)
Calcium: 8.6 mg/dL — ABNORMAL LOW (ref 8.9–10.3)
Chloride: 100 mmol/L (ref 98–111)
Creatinine, Ser: 2.74 mg/dL — ABNORMAL HIGH (ref 0.44–1.00)
GFR, Estimated: 19 mL/min — ABNORMAL LOW (ref >60)
Glucose, Bld: 156 mg/dL — ABNORMAL HIGH (ref 70–99)
Potassium: 4 mmol/L (ref 3.5–5.1)
Sodium: 135 mmol/L (ref 135–145)

## 2022-12-07 LAB — GLUCOSE, CAPILLARY
Glucose-Capillary: 121 mg/dL — ABNORMAL HIGH (ref 70–99)
Glucose-Capillary: 132 mg/dL — ABNORMAL HIGH (ref 70–99)
Glucose-Capillary: 217 mg/dL — ABNORMAL HIGH (ref 70–99)
Glucose-Capillary: 187 mg/dL — ABNORMAL HIGH (ref 70–99)

## 2022-12-07 LAB — CBC
HCT: 29.5 % — ABNORMAL LOW (ref 36.0–46.0)
Hemoglobin: 9.2 g/dL — ABNORMAL LOW (ref 12.0–15.0)
MCH: 26.1 pg (ref 26.0–34.0)
MCHC: 31.2 g/dL (ref 30.0–36.0)
MCV: 83.8 fL (ref 80.0–100.0)
Platelets: 221 10*3/uL (ref 150–400)
RBC: 3.52 MIL/uL — ABNORMAL LOW (ref 3.87–5.11)
RDW: 15.4 % (ref 11.5–15.5)
WBC: 11.8 10*3/uL — ABNORMAL HIGH (ref 4.0–10.5)
nRBC: 0 % (ref 0.0–0.2)

## 2022-12-07 LAB — TROPONIN I (HIGH SENSITIVITY)
Troponin I (High Sensitivity): 8 ng/L (ref <18)
Troponin I (High Sensitivity): 10 ng/L (ref <18)

## 2022-12-07 MED ORDER — HYDRALAZINE HCL 25 MG PO TABS
25.0000 mg | ORAL_TABLET | Freq: Three times a day (TID) | ORAL | Status: DC
  Administered 2022-12-07: 25 mg via ORAL
  Filled 2022-12-07: qty 1

## 2022-12-07 MED ORDER — ISOSORBIDE MONONITRATE ER 60 MG PO TB24
60.0000 mg | ORAL_TABLET | Freq: Every day | ORAL | Status: DC
  Administered 2022-12-08 – 2022-12-16 (×9): 60 mg via ORAL
  Filled 2022-12-07 (×9): qty 1

## 2022-12-07 MED ORDER — NITROGLYCERIN IN D5W 200-5 MCG/ML-% IV SOLN
0.0000 ug/min | INTRAVENOUS | Status: DC
  Administered 2022-12-07: 10 ug/min via INTRAVENOUS
  Administered 2022-12-08: 110 ug/min via INTRAVENOUS
  Filled 2022-12-07 (×2): qty 250

## 2022-12-07 MED ORDER — TICAGRELOR 90 MG PO TABS
90.0000 mg | ORAL_TABLET | Freq: Two times a day (BID) | ORAL | Status: DC
  Administered 2022-12-08 – 2022-12-16 (×17): 90 mg via ORAL
  Filled 2022-12-07 (×17): qty 1

## 2022-12-07 MED ORDER — HYDROCODONE-ACETAMINOPHEN 7.5-325 MG PO TABS
1.0000 | ORAL_TABLET | Freq: Three times a day (TID) | ORAL | Status: DC | PRN
  Administered 2022-12-07 – 2022-12-16 (×16): 1 via ORAL
  Filled 2022-12-07 (×17): qty 1

## 2022-12-07 MED ORDER — TICAGRELOR 90 MG PO TABS: 180.0000 mg | ORAL_TABLET | Freq: Once | ORAL | Status: DC

## 2022-12-07 MED ORDER — HYDROCODONE-ACETAMINOPHEN 10-325 MG PO TABS
1.0000 | ORAL_TABLET | Freq: Once | ORAL | Status: AC
  Administered 2022-12-07: 1 via ORAL
  Filled 2022-12-07: qty 1

## 2022-12-07 MED ORDER — TICAGRELOR 90 MG PO TABS: 90.0000 mg | ORAL_TABLET | Freq: Two times a day (BID) | ORAL | Status: DC

## 2022-12-07 MED ORDER — CEPHALEXIN 250 MG PO CAPS
250.0000 mg | ORAL_CAPSULE | Freq: Every day | ORAL | Status: DC
  Administered 2022-12-12 – 2022-12-15 (×4): 250 mg via ORAL
  Filled 2022-12-07 (×5): qty 1

## 2022-12-07 MED ORDER — TICAGRELOR 90 MG PO TABS
180.0000 mg | ORAL_TABLET | Freq: Once | ORAL | Status: AC
  Administered 2022-12-07: 180 mg via ORAL
  Filled 2022-12-07: qty 2

## 2022-12-07 MED ORDER — SODIUM CHLORIDE 0.9 % IV SOLN: INTRAVENOUS | Status: DC

## 2022-12-07 MED ORDER — CEPHALEXIN 500 MG PO CAPS
500.0000 mg | ORAL_CAPSULE | Freq: Two times a day (BID) | ORAL | Status: AC
  Administered 2022-12-07 – 2022-12-11 (×10): 500 mg via ORAL
  Filled 2022-12-07 (×10): qty 1

## 2022-12-07 MED ORDER — SODIUM CHLORIDE 0.9% IV SOLUTION: Freq: Once | INTRAVENOUS | Status: DC

## 2022-12-07 NOTE — Progress Notes (Signed)
ANTICOAGULATION CONSULT NOTE - Initial Consult  Pharmacy Consult for heparin Indication: chest pain/ACS  Allergies  Allergen Reactions   Naproxen Hives and Rash   Shellfish-Derived Products Hives, Swelling and Other (See Comments)    Angioedema    Strawberry (Diagnostic) Hives   Celecoxib Other (See Comments)    Stomach pain   Glucosamine Hives, Swelling and Other (See Comments)    Angioedema    Iron     IV iron transfusion - hives - Feb 2022 hospitalization   Metoclopramide Other (See Comments)    Facial twitching and stuttering   Metolazone Other (See Comments)    Acute renal failure   Statins Other (See Comments)    Muscle pain    Patient Measurements: Height: 5\' 4"  (162.6 cm) Weight: 107 kg (235 lb 14.3 oz) IBW/kg (Calculated) : 54.7 Heparin Dosing Weight: 80kg  Vital Signs: Temp: 97.8 F (36.6 C) (05/24 2039) Temp Source: Oral (05/24 2039) BP: 157/69 (05/24 2334) Pulse Rate: 92 (05/24 2334)  Labs: Recent Labs    12/06/22 1045 12/06/22 1304 12/06/22 1948 12/07/22 0253 12/07/22 0620  HGB 9.3*  --   --  9.2*  --   HCT 29.7*  --   --  29.5*  --   PLT 235  --   --  221  --   CREATININE 2.73*  --   --  2.74*  --   TROPONINIHS 8 9 9   --  8    Estimated Creatinine Clearance: 26.4 mL/min (A) (by C-G formula based on SCr of 2.74 mg/dL (H)).   Medical History: Past Medical History:  Diagnosis Date   Acquired absence of left great toe (HCC)    Acquired absence of other left toe(s) (HCC)    ACS (acute coronary syndrome) (HCC)    Anemia    Anoxic brain injury (HCC)    Anxiety    CAD (coronary artery disease)    DES mLAD 04/07/15; DES dRCA & DES pPDA 08/30/16; DES mCX & PTCA OM1 09/29/20   Chronic diastolic (congestive) heart failure (HCC)    Chronic pain    Chronic systolic (congestive) heart failure (HCC)    CKD (chronic kidney disease)    Contrast dye induced nephropathy, possible 09/03/2020   COPD (chronic obstructive pulmonary disease) (HCC)     Diabetes (HCC)type 1    Diastolic CHF (HCC)    Dyspnea    Family history of breast cancer 10/23/2021   Gastro-esophageal reflux disease without esophagitis    Hyperlipidemia    Hypertension    Hypertensive heart disease with heart failure (HCC)    Hypoxemia    Idiopathic gout, multiple sites    Leukocytosis 03/29/2022   Localized edema    Low back pain    Mitral regurgitation    Mitral regurgitation    Mixed hyperlipidemia    Mixed incontinence    Morbid (severe) obesity due to excess calories (HCC)    Neuromuscular disorder (HCC)    Neuropathy    Non-ST elevation (NSTEMI) myocardial infarction (HCC)    08/29/20, 09/29/20   OSA (obstructive sleep apnea) 09/03/2020   Other specified hypothyroidism    PAF (paroxysmal atrial fibrillation) (HCC)    s/p pulmonary vein isolation by cryoablation 10/04/15 Dr. Rudolpho Sevin in [facility]   PEA (Pulseless electrical activity) [facility])    ~ 01/13/21 at [facility] during admission DKA, volume overload, possible CAP, hypoxia s/p ACLS with ROSC ~ 10-15   Pneumonia    Primary insomnia    Rheumatoid arthritis (  HCC)    Stroke [facility])    Thyroid disease    Type 1 diabetes mellitus (HCC)    Vitamin D deficiency     Medications:  Medications Prior to Admission  Medication Sig Dispense Refill Last Dose   allopurinol (ZYLOPRIM) 300 MG tablet Take 1 tablet (300 mg total) by mouth daily. 90 tablet 1 12/06/2022   aspirin EC 81 MG tablet Take 1 tablet (81 mg total) by mouth daily. Swallow whole. 30 tablet 12 12/06/2022   busPIRone (BUSPAR) 5 MG tablet Take 1 tablet (5 mg total) by mouth 3 (three) times daily. 270 tablet 1 12/06/2022   carvedilol (COREG) 12.5 MG tablet TAKE ONE TABLET BY MOUTH TWICE DAILY (Patient taking differently: Take 12.5 mg by mouth 2 (two) times daily with a meal.) 180 tablet 3 12/06/2022 at 0800   cephALEXin (KEFLEX) 250 MG capsule Take 250 mg by mouth at bedtime.   12/05/2022   clopidogrel (PLAVIX) 75 MG tablet Take 1 tablet (75 mg total)  by mouth daily. 90 tablet 1 12/05/2022   CRANBERRY PO Take 2 tablets by mouth daily.   12/06/2022   estradiol (ESTRACE) 0.1 MG/GM vaginal cream Place 1 Applicatorful vaginally every other day.   12/05/2022   Evolocumab (REPATHA SURECLICK) 140 MG/ML SOAJ Inject 1 Dose into the skin every 14 (fourteen) days. 6 mL 1 Past Week   ezetimibe (ZETIA) 10 MG tablet Take 1 tablet (10 mg total) by mouth daily. 90 tablet 1 12/05/2022   Furosemide (FUROSCIX [place]) Inject into the skin.      hydrALAZINE (APRESOLINE) 25 MG tablet Take 1 tablet (25 mg total) by mouth 3 (three) times daily. 90 tablet 8 12/06/2022   HYDROcodone-acetaminophen (NORCO) 7.5-325 MG tablet TAKE ONE TABLET BY MOUTH THREE TIMES DAILY AS NEEDED (Patient taking differently: Take 1 tablet by mouth 3 (three) times daily as needed for moderate pain.) 90 tablet 0 12/06/2022   insulin aspart (NOVOLOG FLEXPEN) 100 UNIT/ML FlexPen 2-10 U before meals based on SSI. 15 mL 3 12/06/2022   isosorbide mononitrate (IMDUR) 30 MG 24 hr tablet Take 1 tablet (30 mg total) by mouth daily. 90 tablet 1 12/06/2022   latanoprost (XALATAN) 0.005 % ophthalmic solution Place 1 drop into both eyes at bedtime.   12/05/2022   LEVEMIR FLEXPEN 100 UNIT/ML FlexPen Inject 50 units into THE SKIN daily (Patient taking differently: Inject 50 Units into the skin daily.) 15 mL 1 12/06/2022   linaclotide (LINZESS) 145 MCG CAPS capsule Take 1 capsule (145 mcg total) by mouth daily before breakfast. 90 capsule 1 12/06/2022   LORazepam (ATIVAN) 0.5 MG tablet TAKE ONE TABLET BY MOUTH ONCE DAILY AS NEEDED FOR ANXIETY (Patient taking differently: Take 0.5 mg by mouth every 6 (six) hours as needed for anxiety.) 30 tablet 2 12/06/2022   magnesium oxide (MAG-OX) 400 MG tablet Take 2 tablets (800 mg total) by mouth daily. Take 2 (400 mg) daily=800 mg 60 tablet 1 12/06/2022   nitroGLYCERIN (NITROSTAT) 0.4 MG SL tablet Dissolve 1 tab under tongue as needed for chest pain. May repeat every 5 minutes x 2 doses. If  no relief call 9-1-1. Please call [phone] to schedule an appointment for future refills. Thank you. 50 tablet 0 12/06/2022   Omega-3 Fatty Acids (FISH OIL PO) Take 1 capsule by mouth daily.   12/06/2022   OXYGEN Inhale 2 L into the lungs at bedtime.   12/06/2022   pantoprazole (PROTONIX) 40 MG tablet Take 1 tablet (40 mg total) by mouth daily.  90 tablet 1 12/06/2022   potassium chloride SA (KLOR-CON M) 20 MEQ tablet TAKE ONE TABLET BY MOUTH ONCE daily 90 tablet 1 12/06/2022   Probiotic CAPS Take 1 capsule by mouth daily.   12/06/2022   promethazine (PHENERGAN) 25 MG tablet Take 1 tablet (25 mg total) by mouth daily as needed. (Patient taking differently: Take 25 mg by mouth daily as needed for nausea or vomiting.) 30 tablet 3 12/06/2022   sulfamethoxazole-trimethoprim (BACTRIM DS) 800-160 MG tablet Take 1 tablet by mouth 2 (two) times daily.   12/06/2022   torsemide (DEMADEX) 20 MG tablet TAKE 4 TABLETS BY MOUTH ONCE DAILY (Patient taking differently: Take 80 mg by mouth daily.) 336 tablet 3 12/06/2022   Continuous Blood Gluc Sensor (FREESTYLE LIBRE 2 SENSOR) MISC USE TO check blood glucose AS DIRECTED AND CHANGE sensor every 14 DAYS      EPINEPHrine 0.3 mg/0.3 mL IJ SOAJ injection Use as directed for life-threatening allergic reaction. 4 Device 3    Insulin Pen Needle (BD PEN NEEDLE NANO 2ND GEN) 32G X 4 MM MISC Inject 1 each into the skin in the morning, at noon, in the evening, and at bedtime. 600 each 3    Scheduled:   [START ON 12/08/2022] sodium chloride   Intravenous Once   allopurinol  300 mg Oral Daily   aspirin EC  81 mg Oral Daily   busPIRone  5 mg Oral TID   carvedilol  12.5 mg Oral BID   [START ON 12/12/2022] cephALEXin  250 mg Oral QHS   cephALEXin  500 mg Oral Q12H   ezetimibe  10 mg Oral Daily   heparin  5,000 Units Subcutaneous Q8H   insulin aspart  0-15 Units Subcutaneous TID WC   insulin aspart  0-5 Units Subcutaneous QHS   insulin detemir  30 Units Subcutaneous Daily   [START  ON 12/08/2022] isosorbide mononitrate  60 mg Oral Daily   pantoprazole  40 mg Oral Daily   [START ON 12/08/2022] ticagrelor  90 mg Oral BID   Infusions:   sodium chloride 75 mL/hr at 12/07/22 1057   nitroGLYCERIN 90 mcg/min (12/07/22 2335)    Assessment: 61yo female was initially admitted 5/23 for CP with negative troponins; this evening she c/o 7/10 CP that was relieved to 3/10 after NTGx3, then 1h later CP was at 9/10 and pt reports pain radiating down LUE, now started on NTG infusion with plan to load with Brilinta and start UFH; troponin in process now.  Goal of Therapy:  Heparin level 0.3-0.7 units/ml Monitor platelets by anticoagulation protocol: Yes   Plan:  Rec'd SQ UFH 2h ago. Start heparin infusion at 1100 units/hr. Monitor heparin levels and CBC.  Vernard Gambles, PharmD, BCPS  12/07/2022,11:42 PM

## 2022-12-07 NOTE — Code Documentation (Signed)
Rapid Response Event Note   Reason for Call :  CP  Per RN, pt had 7/10 CP, received 3 SL NTG with pain decreasing to 3/10. An hour later CP increased to 9/10. Cards MD was notified and pt was given 1 SL NTG and started on NTG gtt. Pt had no resolution of pain with SL NTG and NTG gtt at . RRT was notified as well as Cards MD. EKG images reviewed by cards MD.    Initial Focused Assessment:  Pt lying in bed with eyes closed. She is c/o 9/10 L sided CP/pressure that is radiating down her L arm. She is pale/hot/nauseous. Lung CTA. Skin warm to touch.  HR-90, BP-135/59, RR-22, SpO2-90% on 4L [place].   Interventions:  EKG-NSR, nonspecific ST and T wave abnormality NTG gtt increased NRB-SpO2 increased to 98% Heparin per pharmacy Brilinta Plan of Care:  Start heparin, give brilinta. Await trop results. Titrate NTG gtt for CP relief(if possible). Please call RRT if further assistance needed.   Event Summary:   MD Notified: Dr. Aron Baba Call [phone] Arrival [phone] End [id number]  Terrilyn Saver, RN

## 2022-12-07 NOTE — Treatment Plan (Signed)
Critical care cardiology  62 year old lady with significant coronary disease with multiple PCI including prior proximal RCA and mid LAD with recent proximal LAD stents placed and noted circumflex that was occluded after the first OM that was left to be managed medically.  Presented with worsening shortness of breath and chest pain and was planned for cath but was postponed due to acute kidney injury.  This evening patient had severe chest pain episode requiring sublingual nitro x 3 initially to reduce chest pain down to 3 out of 10.  EKG demonstrated some lateral ST depressions but no ST elevation. Chest pain reoccurred and nitro drip was initiated and has had a slow response.  Troponins are ordered and heparin drip as well as Brilinta loading has been initiated.  Current nitro drip rate is at 50 with goal to get at least 100.  If symptoms do not improve we will need to discuss taking to the Cath Lab tonight.  On exam patient is moderate distress on nonrebreather with rapid respiratory rate lungs are clear.  Dana [name] [name] CRITICAL CARE Performed by: Macie Burows   Total critical care time: 45 minutes  Critical care time was exclusive of separately billable procedures and treating other patients.  Critical care was necessary to treat or prevent imminent or life-threatening deterioration.  Critical care was time spent personally by me on the following activities: development of treatment plan with patient and/or surrogate as well as nursing, discussions with consultants, evaluation of patient's response to treatment, examination of patient, obtaining history from patient or surrogate, ordering and performing treatments and interventions, ordering and review of laboratory studies, ordering and review of radiographic studies, pulse oximetry and re-evaluation of patient's condition.

## 2022-12-07 NOTE — Progress Notes (Signed)
PROGRESS NOTE    Dana [name]  UEA:[id number] DOB: 07/26/1960 DOA: 12/06/2022 PCP: Blane Ohara, MD    Brief Narrative:  62 year old with history of coronary artery disease status post multiple stenting, A-fib status post ablation, mitral regurgitation with MitraClip, sleep apnea on CPAP, obesity, cardiac arrest, chronic respiratory failure with hypoxemia, CKD stage IIIb with baseline creatinine about 1.9, anemia, hypertension, hyperlipidemia, gout, GERD, anxiety, depression and diastolic dysfunction presented with midsternal chest pain mostly on the left side transiently improved with nitroglycerin but recurred.  In the emergency room hemodynamically stable.  Creatinine 2.73 from recent baseline of 1.9.  Troponins negative.  EKG nonischemic.  Due to recurrent chest pain, case discussed with cardiology who recommended ischemia evaluation and patient admitted to the hospital.   Assessment & Plan:   Chest pain Patient with extensive history of coronary artery disease, unstable angina: Currently chest pain-free. Troponins and EKG nonischemic. Remains on aspirin, Plavix, beta-blockers and nitrates.  Remains on Zetia. Currently not recommending heparin infusion due to improved chest pain. Will need ongoing wound check in the hospital.  Cardiology planning for cardiac cath once renal functions improve.   Chronic diastolic congestive heart failure: Without exacerbation.  See below for AKI.  Acute kidney injury with history of chronic kidney disease stage IIIb: Baseline creatinine about 1.9.  Multifactorial AKI including recent use of Bactrim. Hold antihypertensives as well as torsemide. Gentle IV fluids today.  Discontinue Bactrim.  UTI: Recently diagnosed at urology office.  Cultures not available.  He started on Bactrim and long-term Keflex for prophylaxis. Reculture of the urine, pending Discontinue Bactrim.  Continue Keflex 500 mg 3 times daily for 5 days then she will go back on her  chronic suppressive therapy 250 mg daily at bedtime.  Hyperlipidemia, on Zetia and Repatha Patient's hypertension, Coreg and Imdur was continued.  Holding hydralazine and torsemide. A-fib, sinus rhythm post ablation. Mitral regurgitation post MitraClip. Type 2 diabetes, well-controlled.  Remains on long-acting insulin and sliding scale insulin. Sleep apnea, using CPAP. Gout, on home allopurinol. GERD, on PPI. Anxiety and depression, on BuSpar and Ativan.   DVT prophylaxis: heparin injection 5,000 Units Start: 12/06/22 1630   Code Status: Full code Family Communication: Daughter at the bedside Disposition Plan: Status is: Observation The patient will require care spanning > 2 midnights and should be moved to inpatient because: Inpatient procedures needed.  IV fluids.  Monitoring.     Consultants:  Cardiology  Procedures:  None  Antimicrobials:  Bactrim, discontinue Keflex 5 mg twice daily 5/24   Subjective: Patient seen in the morning rounds.  Daughter at the bedside.  No overnight events.  Mild soreness was present on her chest wall however currently improved.  Multiple questions answered.  Worried about kidneys, worried about medications.  Telemetry with sinus rhythm.  Objective: Vitals:   12/07/22 0802 12/07/22 0819 12/07/22 1015 12/07/22 1135  BP:  (!) 132/57 (!) 135/57 (!) 116/53  Pulse:  73    Resp: 16   17  Temp: 98.2 F (36.8 C)   98.1 F (36.7 C)  TempSrc: Oral   Oral  SpO2:      Weight:      Height:        Intake/Output Summary (Last 24 hours) at 12/07/2022 1343 Last data filed at 12/07/2022 0600 Gross per 24 hour  Intake 480 ml  Output --  Net 480 ml   Filed Weights   12/06/22 1039 12/06/22 1809 12/07/22 0500  Weight: 109.8 kg 106.4 kg 107  kg    Examination:  General exam: Appears calm and comfortable  Appropriately anxious. Respiratory system: No added sounds. Cardiovascular system: S1 & S2 heard, RRR.  Chronic nonpitting  edema. Gastrointestinal system: Abdomen is nondistended, soft and nontender. No organomegaly or masses felt. Normal bowel sounds heard. Central nervous system: Alert and oriented. No focal neurological deficits. Extremities: Symmetric 5 x 5 power. Skin: No rashes, lesions or ulcers Psychiatry: Judgement and insight appear normal. Mood & affect appropriate.     Data Reviewed: I have personally reviewed following labs and imaging studies  CBC: Recent Labs  Lab 12/04/22 1133 12/06/22 1045 12/07/22 0253  WBC 11.0* 17.3* 11.8*  NEUTROABS 7.6 14.9*  --   HGB 9.7* 9.3* 9.2*  HCT 31.6* 29.7* 29.5*  MCV 87.8 84.9 83.8  PLT 248 235 221   Basic Metabolic Panel: Recent Labs  Lab 12/06/22 1045 12/07/22 0253  NA 135 135  K 4.1 4.0  CL 99 100  CO2 23 25  GLUCOSE 213* 156*  BUN 65* 65*  CREATININE 2.73* 2.74*  CALCIUM 8.8* 8.6*   GFR: Estimated Creatinine Clearance: 26.4 mL/min (A) (by C-G formula based on SCr of 2.74 mg/dL (H)). Liver Function Tests: Recent Labs  Lab 12/06/22 1045  AST 17  ALT 18  ALKPHOS 139*  BILITOT 0.5  PROT 7.3  ALBUMIN 3.2*   No results for input(s): "LIPASE", "AMYLASE" in the last 168 hours. No results for input(s): "AMMONIA" in the last 168 hours. Coagulation Profile: No results for input(s): "INR", "PROTIME" in the last 168 hours. Cardiac Enzymes: No results for input(s): "CKTOTAL", "CKMB", "CKMBINDEX", "TROPONINI" in the last 168 hours. BNP (last 3 results) No results for input(s): "PROBNP" in the last 8760 hours. HbA1C: No results for input(s): "HGBA1C" in the last 72 hours. CBG: Recent Labs  Lab 12/06/22 1801 12/06/22 2139 12/07/22 0801 12/07/22 1133  GLUCAP 201* 246* 121* 132*   Lipid Profile: No results for input(s): "CHOL", "HDL", "LDLCALC", "TRIG", "CHOLHDL", "LDLDIRECT" in the last 72 hours. Thyroid Function Tests: No results for input(s): "TSH", "T4TOTAL", "FREET4", "T3FREE", "THYROIDAB" in the last 72 hours. Anemia  Panel: No results for input(s): "VITAMINB12", "FOLATE", "FERRITIN", "TIBC", "IRON", "RETICCTPCT" in the last 72 hours. Sepsis Labs: Recent Labs  Lab 12/06/22 1456 12/06/22 1814  LATICACIDVEN 1.4 1.1    No results found for this or any previous visit (from the past 240 hour(s)).       Radiology Studies: DG Chest Portable 1 View  Result Date: 12/06/2022 CLINICAL DATA:  Left chest pain radiating to the left arm EXAM: PORTABLE CHEST 1 VIEW COMPARISON:  09/07/2022 FINDINGS: Mitral clips are obscured by overlying ECG lead. Coronary atherosclerosis noted. A small device projecting over the left lower lobe pulmonary artery is compatible with a CardioMEMS type pulmonary arterial pressure monitor. The patient is rotated to the right on today's radiograph, reducing diagnostic sensitivity and specificity. Moderate cardiomegaly. Mild cephalization of blood flow, nonspecific on semi erect positioning, but pulmonary venous hypertension is not excluded. No overt edema. No blunting of the lateral costophrenic angles. Chronic deformity of the left proximal humerus likely from healed fracture. IMPRESSION: 1. Moderate cardiomegaly with mild cephalization of blood flow, nonspecific on semi erect positioning, but pulmonary venous hypertension is not excluded. No overt edema. 2. Mitraclips are obscured by overlying ECG lead. Pulmonary arterial pressure monitor noted. 3. Coronary atherosclerosis. 4. Chronic deformity of the left proximal humerus likely from healed fracture. Electronically Signed   By: Gaylyn Rong M.D.   On:  12/06/2022 11:37        Scheduled Meds:  allopurinol  300 mg Oral Daily   aspirin EC  81 mg Oral Daily   busPIRone  5 mg Oral TID   carvedilol  12.5 mg Oral BID   [START ON 12/12/2022] cephALEXin  250 mg Oral QHS   cephALEXin  500 mg Oral Q12H   clopidogrel  75 mg Oral Daily   ezetimibe  10 mg Oral Daily   heparin  5,000 Units Subcutaneous Q8H   insulin aspart  0-15 Units  Subcutaneous TID WC   insulin aspart  0-5 Units Subcutaneous QHS   insulin detemir  30 Units Subcutaneous Daily   [START ON 12/08/2022] isosorbide mononitrate  60 mg Oral Daily   pantoprazole  40 mg Oral Daily   Continuous Infusions:  sodium chloride 75 mL/hr at 12/07/22 1057     LOS: 0 days    Time spent: 35 minutes    Dorcas Carrow, MD [facility] Pager [phone]

## 2022-12-07 NOTE — Progress Notes (Incomplete)
ANTICOAGULATION CONSULT NOTE - Initial Consult  Pharmacy Consult for heparin Indication: chest pain/ACS  Allergies  Allergen Reactions  . Naproxen Hives and Rash  . Shellfish-Derived Products Hives, Swelling and Other (See Comments)    Angioedema   . Strawberry (Diagnostic) Hives  . Celecoxib Other (See Comments)    Stomach pain  . Glucosamine Hives, Swelling and Other (See Comments)    Angioedema   . Iron     IV iron transfusion - hives - Feb 2022 hospitalization  . Metoclopramide Other (See Comments)    Facial twitching and stuttering  . Metolazone Other (See Comments)    Acute renal failure  . Statins Other (See Comments)    Muscle pain    Patient Measurements: Height: 5\' 4"  (162.6 cm) Weight: 107 kg (235 lb 14.3 oz) IBW/kg (Calculated) : 54.7 Heparin Dosing Weight: 80kg  Vital Signs: Temp: 97.8 F (36.6 C) (05/24 2039) Temp Source: Oral (05/24 2039) BP: 157/69 (05/24 2334) Pulse Rate: 92 (05/24 2334)  Labs: Recent Labs    12/06/22 1045 12/06/22 1304 12/06/22 1948 12/07/22 0253 12/07/22 0620  HGB 9.3*  --   --  9.2*  --   HCT 29.7*  --   --  29.5*  --   PLT 235  --   --  221  --   CREATININE 2.73*  --   --  2.74*  --   TROPONINIHS 8 9 9   --  8    Estimated Creatinine Clearance: 26.4 mL/min (A) (by C-G formula based on SCr of 2.74 mg/dL (H)).   Medical History: Past Medical History:  Diagnosis Date  . Acquired absence of left great toe (HCC)   . Acquired absence of other left toe(s) (HCC)   . ACS (acute coronary syndrome) (HCC)   . Anemia   . Anoxic brain injury (HCC)   . Anxiety   . CAD (coronary artery disease)    DES mLAD 04/07/15; DES dRCA & DES pPDA 08/30/16; DES mCX & PTCA OM1 09/29/20  . Chronic diastolic (congestive) heart failure (HCC)   . Chronic pain   . Chronic systolic (congestive) heart failure (HCC)   . CKD (chronic kidney disease)   . Contrast dye induced nephropathy, possible 09/03/2020  . COPD (chronic obstructive pulmonary  disease) (HCC)   . Diabetes (HCC)type 1   . Diastolic CHF (HCC)   . Dyspnea   . Family history of breast cancer 10/23/2021  . Gastro-esophageal reflux disease without esophagitis   . Hyperlipidemia   . Hypertension   . Hypertensive heart disease with heart failure (HCC)   . Hypoxemia   . Idiopathic gout, multiple sites   . Leukocytosis 03/29/2022  . Localized edema   . Low back pain   . Mitral regurgitation   . Mitral regurgitation   . Mixed hyperlipidemia   . Mixed incontinence   . Morbid (severe) obesity due to excess calories (HCC)   . Neuromuscular disorder (HCC)   . Neuropathy   . Non-ST elevation (NSTEMI) myocardial infarction (HCC)    08/29/20, 09/29/20  . OSA (obstructive sleep apnea) 09/03/2020  . Other specified hypothyroidism   . PAF (paroxysmal atrial fibrillation) (HCC)    s/p pulmonary vein isolation by cryoablation 10/04/15 Dr. Rudolpho Sevin in [facility]  . PEA (Pulseless electrical activity) (HCC)    ~ 01/13/21 at [facility] during admission DKA, volume overload, possible CAP, hypoxia s/p ACLS with ROSC ~ 10-15  . Pneumonia   . Primary insomnia   . Rheumatoid arthritis (  HCC)   . Stroke (HCC)   . Thyroid disease   . Type 1 diabetes mellitus (HCC)   . Vitamin D deficiency     Medications:  Medications Prior to Admission  Medication Sig Dispense Refill Last Dose  . allopurinol (ZYLOPRIM) 300 MG tablet Take 1 tablet (300 mg total) by mouth daily. 90 tablet 1 12/06/2022  . aspirin EC 81 MG tablet Take 1 tablet (81 mg total) by mouth daily. Swallow whole. 30 tablet 12 12/06/2022  . busPIRone (BUSPAR) 5 MG tablet Take 1 tablet (5 mg total) by mouth 3 (three) times daily. 270 tablet 1 12/06/2022  . carvedilol (COREG) 12.5 MG tablet TAKE ONE TABLET BY MOUTH TWICE DAILY (Patient taking differently: Take 12.5 mg by mouth 2 (two) times daily with a meal.) 180 tablet 3 12/06/2022 at 0800  . cephALEXin (KEFLEX) 250 MG capsule Take 250 mg by mouth at bedtime.   12/05/2022  .  clopidogrel (PLAVIX) 75 MG tablet Take 1 tablet (75 mg total) by mouth daily. 90 tablet 1 12/05/2022  . CRANBERRY PO Take 2 tablets by mouth daily.   12/06/2022  . estradiol (ESTRACE) 0.1 MG/GM vaginal cream Place 1 Applicatorful vaginally every other day.   12/05/2022  . Evolocumab (REPATHA SURECLICK) 140 MG/ML SOAJ Inject 1 Dose into the skin every 14 (fourteen) days. 6 mL 1 Past Week  . ezetimibe (ZETIA) 10 MG tablet Take 1 tablet (10 mg total) by mouth daily. 90 tablet 1 12/05/2022  . Furosemide (FUROSCIX [place]) Inject into the skin.     . hydrALAZINE (APRESOLINE) 25 MG tablet Take 1 tablet (25 mg total) by mouth 3 (three) times daily. 90 tablet 8 12/06/2022  . HYDROcodone-acetaminophen (NORCO) 7.5-325 MG tablet TAKE ONE TABLET BY MOUTH THREE TIMES DAILY AS NEEDED (Patient taking differently: Take 1 tablet by mouth 3 (three) times daily as needed for moderate pain.) 90 tablet 0 12/06/2022  . insulin aspart (NOVOLOG FLEXPEN) 100 UNIT/ML FlexPen 2-10 U before meals based on SSI. 15 mL 3 12/06/2022  . isosorbide mononitrate (IMDUR) 30 MG 24 hr tablet Take 1 tablet (30 mg total) by mouth daily. 90 tablet 1 12/06/2022  . latanoprost (XALATAN) 0.005 % ophthalmic solution Place 1 drop into both eyes at bedtime.   12/05/2022  . LEVEMIR FLEXPEN 100 UNIT/ML FlexPen Inject 50 units into THE SKIN daily (Patient taking differently: Inject 50 Units into the skin daily.) 15 mL 1 12/06/2022  . linaclotide (LINZESS) 145 MCG CAPS capsule Take 1 capsule (145 mcg total) by mouth daily before breakfast. 90 capsule 1 12/06/2022  . LORazepam (ATIVAN) 0.5 MG tablet TAKE ONE TABLET BY MOUTH ONCE DAILY AS NEEDED FOR ANXIETY (Patient taking differently: Take 0.5 mg by mouth every 6 (six) hours as needed for anxiety.) 30 tablet 2 12/06/2022  . magnesium oxide (MAG-OX) 400 MG tablet Take 2 tablets (800 mg total) by mouth daily. Take 2 (400 mg) daily=800 mg 60 tablet 1 12/06/2022  . nitroGLYCERIN (NITROSTAT) 0.4 MG SL tablet Dissolve 1 tab  under tongue as needed for chest pain. May repeat every 5 minutes x 2 doses. If no relief call 9-1-1. Please call [phone] to schedule an appointment for future refills. Thank you. 50 tablet 0 12/06/2022  . Omega-3 Fatty Acids (FISH OIL PO) Take 1 capsule by mouth daily.   12/06/2022  . OXYGEN Inhale 2 L into the lungs at bedtime.   12/06/2022  . pantoprazole (PROTONIX) 40 MG tablet Take 1 tablet (40 mg total) by mouth daily.  90 tablet 1 12/06/2022  . potassium chloride SA (KLOR-CON M) 20 MEQ tablet TAKE ONE TABLET BY MOUTH ONCE daily 90 tablet 1 12/06/2022  . Probiotic CAPS Take 1 capsule by mouth daily.   12/06/2022  . promethazine (PHENERGAN) 25 MG tablet Take 1 tablet (25 mg total) by mouth daily as needed. (Patient taking differently: Take 25 mg by mouth daily as needed for nausea or vomiting.) 30 tablet 3 12/06/2022  . sulfamethoxazole-trimethoprim (BACTRIM DS) 800-160 MG tablet Take 1 tablet by mouth 2 (two) times daily.   12/06/2022  . torsemide (DEMADEX) 20 MG tablet TAKE 4 TABLETS BY MOUTH ONCE DAILY (Patient taking differently: Take 80 mg by mouth daily.) 336 tablet 3 12/06/2022  . Continuous Blood Gluc Sensor (FREESTYLE LIBRE 2 SENSOR) MISC USE TO check blood glucose AS DIRECTED AND CHANGE sensor every 14 DAYS     . EPINEPHrine 0.3 mg/0.3 mL IJ SOAJ injection Use as directed for life-threatening allergic reaction. 4 Device 3   . Insulin Pen Needle (BD PEN NEEDLE NANO 2ND GEN) 32G X 4 MM MISC Inject 1 each into the skin in the morning, at noon, in the evening, and at bedtime. 600 each 3    Scheduled:  . [START ON 12/08/2022] sodium chloride   Intravenous Once  . allopurinol  300 mg Oral Daily  . aspirin EC  81 mg Oral Daily  . busPIRone  5 mg Oral TID  . carvedilol  12.5 mg Oral BID  . [START ON 12/12/2022] cephALEXin  250 mg Oral QHS  . cephALEXin  500 mg Oral Q12H  . ezetimibe  10 mg Oral Daily  . heparin  5,000 Units Subcutaneous Q8H  . insulin aspart  0-15 Units Subcutaneous TID WC  .  insulin aspart  0-5 Units Subcutaneous QHS  . insulin detemir  30 Units Subcutaneous Daily  . [START ON 12/08/2022] isosorbide mononitrate  60 mg Oral Daily  . pantoprazole  40 mg Oral Daily  . [START ON 12/08/2022] ticagrelor  90 mg Oral BID   Infusions:  . sodium chloride 75 mL/hr at 12/07/22 1057  . nitroGLYCERIN 90 mcg/min (12/07/22 2335)    Assessment: 61yo female was initially admitted 5/23 for CP with negative troponins; this evening she c/o 7/10 CP that was relieved to 3/10 after NTGx3, then 1h later CP was at 9/10 and pt reports pain radiating down LUE, now started on NTG infusion with plan to load with Brilinta and start UFH; troponin in process now.  Goal of Therapy:  Heparin level 0.3-0.7 units/ml Monitor platelets by anticoagulation protocol: Yes   Plan:  Rec'd SQ UFH 2h ago. Start heparin infusion at 1100 units/hr. Monitor   Vernard Gambles, PharmD, BCPS  12/07/2022,11:42 PM

## 2022-12-07 NOTE — Progress Notes (Addendum)
Rounding Note    Patient Name: Dana [name] Date of Encounter: 12/07/2022  [facility] Cardiologist: Arvilla Meres, MD   Subjective   Patient sitting up on the side of her bed this morning. Reports ongoing exertional dyspnea and chest tightness when she gets up to walk to the bathroom. Denies chest pain.   Inpatient Medications    Scheduled Meds:  allopurinol  300 mg Oral Daily   aspirin EC  81 mg Oral Daily   busPIRone  5 mg Oral TID   carvedilol  12.5 mg Oral BID   clopidogrel  75 mg Oral Daily   ezetimibe  10 mg Oral Daily   heparin  5,000 Units Subcutaneous Q8H   insulin aspart  0-15 Units Subcutaneous TID WC   insulin aspart  0-5 Units Subcutaneous QHS   insulin detemir  30 Units Subcutaneous Daily   isosorbide mononitrate  30 mg Oral Daily   pantoprazole  40 mg Oral Daily   Continuous Infusions:  sodium chloride     PRN Meds: acetaminophen, LORazepam, nitroGLYCERIN, ondansetron (ZOFRAN) IV   Vital Signs    Vitals:   12/07/22 0559 12/07/22 0600 12/07/22 0802 12/07/22 0819  BP: (!) 145/56   (!) 132/57  Pulse: 76 77  73  Resp: 18  16   Temp: 98.2 F (36.8 C)  98.2 F (36.8 C)   TempSrc: Oral  Oral   SpO2: 98% 99%    Weight:      Height:        Intake/Output Summary (Last 24 hours) at 12/07/2022 0832 Last data filed at 12/07/2022 0600 Gross per 24 hour  Intake 480 ml  Output --  Net 480 ml      12/07/2022    5:00 AM 12/06/2022    6:09 PM 12/06/2022   10:39 AM  Last 3 Weights  Weight (lbs) 235 lb 14.3 oz 234 lb 9.6 oz 242 lb  Weight (kg) 107 kg 106.414 kg 109.77 kg      Telemetry    Sinus rhythm, isolated PVCs - Personally Reviewed  ECG    No new tracing - Personally Reviewed  Physical Exam   GEN: No acute distress.   Neck: No JVD Cardiac: RRR, no murmurs, rubs, or gallops.  Respiratory: Clear to auscultation bilaterally. GI: Soft, nontender, non-distended  MS: No edema; No deformity. Neuro:  Nonfocal  Psych: Normal  affect   Labs    High Sensitivity Troponin:   Recent Labs  Lab 12/06/22 1045 12/06/22 1304 12/06/22 1948 12/07/22 0620  TROPONINIHS 8 9 9 8      Chemistry Recent Labs  Lab 12/06/22 1045 12/07/22 0253  NA 135 135  K 4.1 4.0  CL 99 100  CO2 23 25  GLUCOSE 213* 156*  BUN 65* 65*  CREATININE 2.73* 2.74*  CALCIUM 8.8* 8.6*  PROT 7.3  --   ALBUMIN 3.2*  --   AST 17  --   ALT 18  --   ALKPHOS 139*  --   BILITOT 0.5  --   GFRNONAA 19* 19*  ANIONGAP 13 10    Lipids No results for input(s): "CHOL", "TRIG", "HDL", "LABVLDL", "LDLCALC", "CHOLHDL" in the last 168 hours.  Hematology Recent Labs  Lab 12/04/22 1133 12/06/22 1045 12/07/22 0253  WBC 11.0* 17.3* 11.8*  RBC 3.60* 3.50* 3.52*  HGB 9.7* 9.3* 9.2*  HCT 31.6* 29.7* 29.5*  MCV 87.8 84.9 83.8  MCH 26.9 26.6 26.1  MCHC 30.7 31.3 31.2  RDW 15.2  15.4 15.4  PLT 248 235 221   Thyroid No results for input(s): "TSH", "FREET4" in the last 168 hours.  BNP Recent Labs  Lab 12/06/22 1814  BNP 143.5*    DDimer No results for input(s): "DDIMER" in the last 168 hours.   Radiology    DG Chest Portable 1 View  Result Date: 12/06/2022 CLINICAL DATA:  Left chest pain radiating to the left arm EXAM: PORTABLE CHEST 1 VIEW COMPARISON:  09/07/2022 FINDINGS: Mitral clips are obscured by overlying ECG lead. Coronary atherosclerosis noted. A small device projecting over the left lower lobe pulmonary artery is compatible with a CardioMEMS type pulmonary arterial pressure monitor. The patient is rotated to the right on today's radiograph, reducing diagnostic sensitivity and specificity. Moderate cardiomegaly. Mild cephalization of blood flow, nonspecific on semi erect positioning, but pulmonary venous hypertension is not excluded. No overt edema. No blunting of the lateral costophrenic angles. Chronic deformity of the left proximal humerus likely from healed fracture. IMPRESSION: 1. Moderate cardiomegaly with mild cephalization of blood  flow, nonspecific on semi erect positioning, but pulmonary venous hypertension is not excluded. No overt edema. 2. Mitraclips are obscured by overlying ECG lead. Pulmonary arterial pressure monitor noted. 3. Coronary atherosclerosis. 4. Chronic deformity of the left proximal humerus likely from healed fracture. Electronically Signed   By: Gaylyn Rong M.D.   On: 12/06/2022 11:37    Cardiac Studies   - TEE (10/22): EF 55-60%, basal inferior akinesis, severe central MR. - Echo (10/22): EF 65-70%, RV ok, moderate MR - Echo (5/21): EF 60-65%, RV ok, Grade II DD. - Echo (2/22): EF 55-60%, RV ok, Grade II DD, mod MR - Echo (3/22): EF 55-60%, Grade II DD, normal RV - Echo (6/22): EF 60%, mod MR/TR (per chart review at [facility]). - Zio (8/19): no arrhythmias, occasional PAC/PVCs - Zio (9/22): mostly SB/SR no afib, 66 SVT runs, frequent isolated SVE (11.2%).   - LHC (3/22): 3 vessel CAD with moderate mid-LAD and mid-RCA stenoses, and severe mid-LCx stenosis; successful PCI of the mid-circumflex, reducing severe 90% stenosis to 0% with a 2.75x22 mm Resolute Onyx DES, provisional side branch angioplasty of the OM1 with a 2.5 mm balloon through the stent struts   Cardiac cath 09/07/22      Mid LAD to Dist LAD lesion is 60% stenosed.   Prox RCA lesion is 60% stenosed.   1st Mrg-1 lesion is 30% stenosed.   Mid Cx-2 lesion is 100% stenosed.   1st Mrg-2 lesion is 60% stenosed.   Prox LAD to Mid LAD lesion is 90% stenosed.   A drug-eluting stent was successfully placed using a SYNERGY XD 2.75X28.   Post intervention, there is a 0% residual stenosis.   Mid LAD lesion is 40% stenosed.   Non-stenotic Mid Cx-1 lesion was previously treated.   Non-stenotic Dist RCA lesion was previously treated.   Post intervention, there is a 0% residual stenosis.   LV end diastolic pressure is mildly elevated.   2 vessel obstructive CAD. New 90% stenosis in the proximal LAD with RFR 0.48. The stent in the LCx is  occluded after a large first OM Patent stent in the distal RCA Mildly elevated LVEDP 21 mm Hg Successful PCI of the proximal LAD with DES x 1 overlapping with prior stent.    Plan: observe overnight. Anticipate DC in am. DAPT indefinitely with ASA/Plavix.  Will treat LCx occlusion medically.          Diagnostic Dominance: Right  Intervention  Patient Profile     VERONIKA LORIO is a 62 y.o. female with a hx of chronic diastolic HF, cardiomems 02/2018, CAD with hx DES RCA and LAD -2018, 2022 DES mLCX, and PTCA in side branch of OM1 DM, HTN, HLD, PAD, OSA on CPAP, PAF s/p ablation, PAD s/p L great toe and 4th toe amputation, stage IIIb CKD who is being seen for the evaluation and management of chest pain.  Assessment & Plan    Chest pain CAD  Patient presented to the ED yesterday via EMS after she developed chest pain at rest that did not resolve. Troponin 8->9->9->8. ECG without acute ischemic changes. She has history of stent to RCA, LAD, LCX. Last cath 09/07/22 with PCI proximal to mid LAD. This cath showed stent in LCX occluded after large first OM.   No NSTEMI; we have not started heparin (clarification from AM Note) Given ongoing elevation of creatinine 2.74 and hx contrast nephropathy, will need to defer LHC, will tentatively plan for Tuesday.  Increase Imdur to 60mg . Continue Coreg 12.5mg  BID Continue DAPT, ASA 81mg , Plavix 75mg .   Paroxysmal atrial fibrillation  Hx PAF s/p ablation >10 years ago. Has not had documented recurrence and is not on chronic anticoagulation.   Coreg 12.5mg  BID.  Hyperlipidemia  Patient with statin intolerance. Followed by Dr. Rennis Golden. Continue Repatha and Zetia.   Chronic diastolic CHF  Last echo from January of this year showed LVEF 55-60%. Volume has generally been stable per CardioMEMs monitor.   Currently euvolemic, home Torsemide 80mg  has been held due to AKI. Will need to monitor volume status closely with rehydration  Also on  hydralazine 25mg  TID PTA. Will continue holding and increase Imdur given concern of CAD.   Mitral regurgitation   Severe central MR on TEE in October 2022. S/P TEER 05/18/21, two clips placed. January 2024 echo with stable moderate MR, mean gradient of 7.94mmHg.   Patient does have systolic murmur at apex on exam. Will discuss repeating TTE with Dr. Izora Ribas.   AKI on CKD IIIb UTI  Patient recently started on Bactrim for UTI. WBC 11->17.3->11.8. Creatinine 2.73->2.74 (recent baseline ~2.0). Management per primary team who has switched from Bactrim to Keflex and placed on cautious saline infusion.      For questions or updates, please contact [facility] Please consult [email or web address] for contact info under        Signed, Perlie Gold, PA-C  12/07/2022, 8:32 AM    Personally seen and examined. Agree with APP above with the following comments: 62 yo M with complex CAD with AKI on CKD. Chest pain has improved. Rare light headedness.   Chest pressures worsens with exertion.   Systolic murmur noted on exam in the setting of prior mTEER; less suggestive of severe MR. Will increase Imdur dose Agree with IVF and bactrim hold If worsening SOB will repeat echo Planned for LHC on Tuesday.  Riley Lam, MD FASE [facility] Cardiologist [facility]  [address] [place], [address] [place], [place] [address] [phone]  11:49 AM

## 2022-12-08 ENCOUNTER — Encounter (HOSPITAL_COMMUNITY)
Admission: EM | Disposition: A | Discharge status: Home / Self Care | Payer: Self-pay | Source: Home / Self Care | Attending: Internal Medicine

## 2022-12-08 ENCOUNTER — Inpatient Hospital Stay (HOSPITAL_COMMUNITY): Payer: PPO

## 2022-12-08 ENCOUNTER — Other Ambulatory Visit (HOSPITAL_COMMUNITY): Payer: PPO

## 2022-12-08 DIAGNOSIS — N1832 Chronic kidney disease, stage 3b: Secondary | ICD-10-CM

## 2022-12-08 DIAGNOSIS — I200 Unstable angina: Secondary | ICD-10-CM | POA: Diagnosis not present

## 2022-12-08 DIAGNOSIS — I214 Non-ST elevation (NSTEMI) myocardial infarction: Secondary | ICD-10-CM | POA: Diagnosis not present

## 2022-12-08 DIAGNOSIS — I2510 Atherosclerotic heart disease of native coronary artery without angina pectoris: Secondary | ICD-10-CM

## 2022-12-08 DIAGNOSIS — N179 Acute kidney failure, unspecified: Secondary | ICD-10-CM

## 2022-12-08 DIAGNOSIS — N184 Chronic kidney disease, stage 4 (severe): Secondary | ICD-10-CM | POA: Diagnosis not present

## 2022-12-08 DIAGNOSIS — Z9889 Other specified postprocedural states: Secondary | ICD-10-CM | POA: Diagnosis not present

## 2022-12-08 HISTORY — PX: CORONARY/GRAFT ACUTE MI REVASCULARIZATION: CATH118305

## 2022-12-08 HISTORY — PX: LEFT HEART CATH AND CORONARY ANGIOGRAPHY: CATH118249

## 2022-12-08 LAB — CBC
HCT: 28.2 % — ABNORMAL LOW (ref 36.0–46.0)
Hemoglobin: 8.9 g/dL — ABNORMAL LOW (ref 12.0–15.0)
MCH: 26.8 pg (ref 26.0–34.0)
MCHC: 31.6 g/dL (ref 30.0–36.0)
MCV: 84.9 fL (ref 80.0–100.0)
Platelets: 238 10*3/uL (ref 150–400)
RBC: 3.32 MIL/uL — ABNORMAL LOW (ref 3.87–5.11)
RDW: 15.2 % (ref 11.5–15.5)
WBC: 17.2 10*3/uL — ABNORMAL HIGH (ref 4.0–10.5)
nRBC: 0 % (ref 0.0–0.2)

## 2022-12-08 LAB — GLUCOSE, CAPILLARY
Glucose-Capillary: 190 mg/dL — ABNORMAL HIGH (ref 70–99)
Glucose-Capillary: 183 mg/dL — ABNORMAL HIGH (ref 70–99)
Glucose-Capillary: 161 mg/dL — ABNORMAL HIGH (ref 70–99)
Glucose-Capillary: 165 mg/dL — ABNORMAL HIGH (ref 70–99)

## 2022-12-08 LAB — BPAM RBC
Blood Product Expiration Date: 202406112359
ISSUE DATE / TIME: 202405110745
ISSUE DATE / TIME: 202405250338
Unit Type and Rh: 600

## 2022-12-08 LAB — BRAIN NATRIURETIC PEPTIDE: B Natriuretic Peptide: 149.2 pg/mL — ABNORMAL HIGH (ref 0.0–100.0)

## 2022-12-08 LAB — BASIC METABOLIC PANEL
Anion gap: 13 (ref 5–15)
BUN: 64 mg/dL — ABNORMAL HIGH (ref 6–20)
CO2: 21 mmol/L — ABNORMAL LOW (ref 22–32)
Calcium: 8.4 mg/dL — ABNORMAL LOW (ref 8.9–10.3)
Chloride: 102 mmol/L (ref 98–111)
Creatinine, Ser: 2.89 mg/dL — ABNORMAL HIGH (ref 0.44–1.00)
GFR, Estimated: 18 mL/min — ABNORMAL LOW (ref >60)
Glucose, Bld: 184 mg/dL — ABNORMAL HIGH (ref 70–99)
Potassium: 4.1 mmol/L (ref 3.5–5.1)
Sodium: 136 mmol/L (ref 135–145)

## 2022-12-08 LAB — HEPARIN LEVEL (UNFRACTIONATED): Heparin Unfractionated: 0.26 IU/mL — ABNORMAL LOW (ref 0.30–0.70)

## 2022-12-08 LAB — TYPE AND SCREEN
Unit division: 0
Unit division: 0

## 2022-12-08 LAB — TROPONIN I (HIGH SENSITIVITY)
Troponin I (High Sensitivity): 628 ng/L (ref <18)
Troponin I (High Sensitivity): 2035 ng/L (ref <18)
Troponin I (High Sensitivity): 1740 ng/L (ref <18)

## 2022-12-08 LAB — PREPARE RBC (CROSSMATCH)

## 2022-12-08 LAB — POCT ACTIVATED CLOTTING TIME
Activated Clotting Time: 309 seconds
Activated Clotting Time: 195 seconds

## 2022-12-08 SURGERY — LEFT HEART CATH AND CORONARY ANGIOGRAPHY
Anesthesia: LOCAL

## 2022-12-08 MED ORDER — NITROGLYCERIN IN D5W 200-5 MCG/ML-% IV SOLN
0.0000 ug/min | INTRAVENOUS | Status: DC
  Administered 2022-12-08: 30 ug/min via INTRAVENOUS

## 2022-12-08 MED ORDER — MORPHINE SULFATE (PF) 2 MG/ML IV SOLN: 1.0000 mg | Freq: Once | INTRAVENOUS | Status: DC

## 2022-12-08 MED ORDER — FUROSEMIDE 10 MG/ML IJ SOLN: 40.0000 mg | Freq: Once | INTRAMUSCULAR | Status: DC

## 2022-12-08 MED ORDER — MORPHINE SULFATE (PF) 2 MG/ML IV SOLN
2.0000 mg | Freq: Once | INTRAVENOUS | Status: AC
  Administered 2022-12-08: 2 mg via INTRAVENOUS
  Filled 2022-12-08: qty 1

## 2022-12-08 MED ORDER — SODIUM CHLORIDE 0.9% FLUSH
3.0000 mL | Freq: Two times a day (BID) | INTRAVENOUS | Status: DC
  Administered 2022-12-09 – 2022-12-12 (×5): 3 mL via INTRAVENOUS

## 2022-12-08 MED ORDER — FUROSEMIDE 10 MG/ML IJ SOLN
INTRAMUSCULAR | Status: AC
  Filled 2022-12-08: qty 4

## 2022-12-08 MED ORDER — ONDANSETRON HCL 4 MG/2ML IJ SOLN
INTRAMUSCULAR | Status: AC
  Filled 2022-12-08: qty 2

## 2022-12-08 MED ORDER — HEPARIN (PORCINE) 25000 UT/250ML-% IV SOLN
1400.0000 [IU]/h | INTRAVENOUS | Status: DC
  Administered 2022-12-09: 1400 [IU]/h via INTRAVENOUS
  Filled 2022-12-08: qty 250

## 2022-12-08 MED ORDER — ONDANSETRON HCL 4 MG/2ML IJ SOLN
INTRAMUSCULAR | Status: DC | PRN
  Administered 2022-12-08: 4 mg via INTRAVENOUS

## 2022-12-08 MED ORDER — METOPROLOL TARTRATE 5 MG/5ML IV SOLN
5.0000 mg | Freq: Once | INTRAVENOUS | Status: AC
  Administered 2022-12-08: 5 mg via INTRAVENOUS
  Filled 2022-12-08: qty 5

## 2022-12-08 MED ORDER — LABETALOL HCL 5 MG/ML IV SOLN: 10.0000 mg | INTRAVENOUS | Status: AC | PRN

## 2022-12-08 MED ORDER — HEPARIN (PORCINE) 25000 UT/250ML-% IV SOLN: 1250.0000 [IU]/h | INTRAVENOUS | Status: DC

## 2022-12-08 MED ORDER — SODIUM CHLORIDE 0.9 % IV SOLN: INTRAVENOUS | Status: AC

## 2022-12-08 MED ORDER — LIDOCAINE HCL (PF) 1 % IJ SOLN
INTRAMUSCULAR | Status: AC
  Filled 2022-12-08: qty 30

## 2022-12-08 MED ORDER — FENTANYL CITRATE (PF) 100 MCG/2ML IJ SOLN
INTRAMUSCULAR | Status: AC
  Filled 2022-12-08: qty 2

## 2022-12-08 MED ORDER — FUROSEMIDE 10 MG/ML IJ SOLN
40.0000 mg | Freq: Once | INTRAMUSCULAR | Status: AC
  Administered 2022-12-08: 40 mg via INTRAVENOUS
  Filled 2022-12-08: qty 4

## 2022-12-08 MED ORDER — SODIUM CHLORIDE 0.9 % IV SOLN
250.0000 mL | INTRAVENOUS | Status: DC | PRN
  Administered 2022-12-09: 250 mL via INTRAVENOUS

## 2022-12-08 MED ORDER — HEPARIN (PORCINE) IN NACL 1000-0.9 UT/500ML-% IV SOLN
INTRAVENOUS | Status: DC | PRN
  Administered 2022-12-08 (×2): 500 mL

## 2022-12-08 MED ORDER — SODIUM CHLORIDE 0.9 % IV BOLUS
500.0000 mL | Freq: Once | INTRAVENOUS | Status: AC
  Administered 2022-12-08: 500 mL via INTRAVENOUS

## 2022-12-08 MED ORDER — HYDRALAZINE HCL 20 MG/ML IJ SOLN: 10.0000 mg | INTRAMUSCULAR | Status: AC | PRN

## 2022-12-08 MED ORDER — LIDOCAINE HCL (PF) 1 % IJ SOLN
INTRAMUSCULAR | Status: DC | PRN
  Administered 2022-12-08: 15 mL

## 2022-12-08 MED ORDER — HEPARIN BOLUS VIA INFUSION
1000.0000 [IU] | Freq: Once | INTRAVENOUS | Status: DC
  Filled 2022-12-08: qty 1000

## 2022-12-08 MED ORDER — SODIUM CHLORIDE 0.9% FLUSH: 3.0000 mL | INTRAVENOUS | Status: DC | PRN

## 2022-12-08 MED ORDER — IOHEXOL 350 MG/ML SOLN
INTRAVENOUS | Status: DC | PRN
  Administered 2022-12-08: 60 mL via INTRA_ARTERIAL

## 2022-12-08 MED ORDER — SODIUM CHLORIDE 0.9 % IV BOLUS: 500.0000 mL | Freq: Once | INTRAVENOUS | Status: DC

## 2022-12-08 MED ORDER — HEPARIN SODIUM (PORCINE) 1000 UNIT/ML IJ SOLN
INTRAMUSCULAR | Status: AC
  Filled 2022-12-08: qty 10

## 2022-12-08 MED ORDER — HEPARIN SODIUM (PORCINE) 1000 UNIT/ML IJ SOLN
INTRAMUSCULAR | Status: DC | PRN
  Administered 2022-12-08: 9000 [IU] via INTRAVENOUS

## 2022-12-08 MED ORDER — FUROSEMIDE 10 MG/ML IJ SOLN
INTRAMUSCULAR | Status: DC | PRN
  Administered 2022-12-08: 40 mg via INTRAVENOUS

## 2022-12-08 MED ORDER — SODIUM CHLORIDE 0.9% FLUSH
3.0000 mL | Freq: Two times a day (BID) | INTRAVENOUS | Status: DC
  Administered 2022-12-08 – 2022-12-16 (×15): 3 mL via INTRAVENOUS

## 2022-12-08 MED ORDER — HEPARIN (PORCINE) 25000 UT/250ML-% IV SOLN
1250.0000 [IU]/h | INTRAVENOUS | Status: DC
  Administered 2022-12-08: 1100 [IU]/h via INTRAVENOUS
  Filled 2022-12-08: qty 250

## 2022-12-08 MED ORDER — FENTANYL CITRATE (PF) 100 MCG/2ML IJ SOLN
INTRAMUSCULAR | Status: DC | PRN
  Administered 2022-12-08: 25 ug via INTRAVENOUS

## 2022-12-08 SURGICAL SUPPLY — 20 items
BALLN EMERGE MR 2.0X12 (BALLOONS) ×1
BALLN SCOREFLEX 2.75X15 (BALLOONS) ×1
BALLOON EMERGE MR 2.0X12 (BALLOONS) IMPLANT
BALLOON SCOREFLEX 2.75X15 (BALLOONS) IMPLANT
CATH INFINITI 6F ANG MULTIPACK (CATHETERS) IMPLANT
CATH LAUNCHER 6FR EBU3.5 (CATHETERS) IMPLANT
ELECT DEFIB PAD ADLT CADENCE (PAD) IMPLANT
GLIDESHEATH SLEND SS 6F .021 (SHEATH) IMPLANT
GUIDEWIRE INQWIRE 1.5J.035X260 (WIRE) IMPLANT
INQWIRE 1.5J .035X260CM (WIRE)
KIT ENCORE 26 ADVANTAGE (KITS) IMPLANT
KIT HEART LEFT (KITS) ×1 IMPLANT
KIT MICROPUNCTURE NIT STIFF (SHEATH) IMPLANT
PACK CARDIAC CATHETERIZATION (CUSTOM PROCEDURE TRAY) ×1 IMPLANT
SHEATH PINNACLE 6F 10CM (SHEATH) IMPLANT
SHEATH PROBE COVER 6X72 (BAG) IMPLANT
TRANSDUCER W/STOPCOCK (MISCELLANEOUS) ×1 IMPLANT
TUBING CIL FLEX 10 FLL-RA (TUBING) ×1 IMPLANT
WIRE ASAHI PROWATER 180CM (WIRE) IMPLANT
WIRE EMERALD 3MM-J .035X150CM (WIRE) IMPLANT

## 2022-12-08 NOTE — Progress Notes (Addendum)
Radial/brachial ABG attempted w/out success. Peripheral pulses weak, thin & thready. RN currently discussing pt status w/MD. Will follow-up pending MD orders. Pt resp status remains stable w/unlabored respirations on 4L [place] O2 sats 95-97% and clear BS

## 2022-12-08 NOTE — Progress Notes (Addendum)
Rounding Note    Patient Name: Dana [name] Date of Encounter: 12/08/2022  [facility] Cardiologist: Arvilla Meres, MD   Subjective   Worsening CP over night with new NSTEMI. Loaded with Brilinta. Placed on nitro drip. Worsening ST changes. Worsening Kidney function. Worsening anemia.   Chest pain is bearable on the nitro drip.  Kidneys are worse; a second troponin is ordered.  Inpatient Medications    Scheduled Meds:  sodium chloride   Intravenous Once   allopurinol  300 mg Oral Daily   aspirin EC  81 mg Oral Daily   busPIRone  5 mg Oral TID   carvedilol  12.5 mg Oral BID   [START ON 12/12/2022] cephALEXin  250 mg Oral QHS   cephALEXin  500 mg Oral Q12H   ezetimibe  10 mg Oral Daily   furosemide  40 mg Intravenous Once   insulin aspart  0-15 Units Subcutaneous TID WC   insulin aspart  0-5 Units Subcutaneous QHS   insulin detemir  30 Units Subcutaneous Daily   isosorbide mononitrate  60 mg Oral Daily    morphine injection  1 mg Intravenous Once   pantoprazole  40 mg Oral Daily   ticagrelor  90 mg Oral BID   Continuous Infusions:  heparin 1,100 Units/hr (12/08/22 0039)   nitroGLYCERIN 110 mcg/min (12/08/22 0723)   PRN Meds: acetaminophen, HYDROcodone-acetaminophen, LORazepam, nitroGLYCERIN, ondansetron (ZOFRAN) IV   Vital Signs    Vitals:   12/08/22 0149 12/08/22 0329 12/08/22 0402 12/08/22 0538  BP:  (!) 127/51 (!) 133/51   Pulse: 80 82    Resp:  19 18   Temp:  98.4 F (36.9 C) 98.4 F (36.9 C)   TempSrc:  Oral Oral   SpO2: 95% 95% 99% 96%  Weight:    107.4 kg  Height:        Intake/Output Summary (Last 24 hours) at 12/08/2022 0742 Last data filed at 12/08/2022 0513 Gross per 24 hour  Intake 630 ml  Output --  Net 630 ml      12/08/2022    5:38 AM 12/07/2022    5:00 AM 12/06/2022    6:09 PM  Last 3 Weights  Weight (lbs) 236 lb 12.4 oz 235 lb 14.3 oz 234 lb 9.6 oz  Weight (kg) 107.4 kg 107 kg 106.414 kg      Telemetry    SR  with short run of SVT - Personally Reviewed  ECG    No new tracing - Personally Reviewed  Physical Exam   GEN:  Moderate distress, ill appearing Neck: No JVD Cardiac: RRR, systolic murmur; no, rubs, or gallops.  Respiratory: Clear to auscultation bilaterally. Slight tachypnea GI: Soft, nontender, non-distended  MS: No edema; No deformity. +2 R femoral pulse Neuro:  Nonfocal  Psych: Normal affect   Labs    High Sensitivity Troponin:   Recent Labs  Lab 12/06/22 1304 12/06/22 1948 12/07/22 0620 12/07/22 2258 12/08/22 0159  TROPONINIHS 9 9 8 10  628*     Chemistry Recent Labs  Lab 12/06/22 1045 12/07/22 0253 12/08/22 0159  NA 135 135 136  K 4.1 4.0 4.1  CL 99 100 102  CO2 23 25 21*  GLUCOSE 213* 156* 184*  BUN 65* 65* 64*  CREATININE 2.73* 2.74* 2.89*  CALCIUM 8.8* 8.6* 8.4*  PROT 7.3  --   --   ALBUMIN 3.2*  --   --   AST 17  --   --   ALT 18  --   --  ALKPHOS 139*  --   --   BILITOT 0.5  --   --   GFRNONAA 19* 19* 18*  ANIONGAP 13 10 13     Lipids No results for input(s): "CHOL", "TRIG", "HDL", "LABVLDL", "LDLCALC", "CHOLHDL" in the last 168 hours.  Hematology Recent Labs  Lab 12/06/22 1045 12/07/22 0253 12/08/22 0159  WBC 17.3* 11.8* 17.2*  RBC 3.50* 3.52* 3.32*  HGB 9.3* 9.2* 8.9*  HCT 29.7* 29.5* 28.2*  MCV 84.9 83.8 84.9  MCH 26.6 26.1 26.8  MCHC 31.3 31.2 31.6  RDW 15.4 15.4 15.2  PLT 235 221 238   Thyroid No results for input(s): "TSH", "FREET4" in the last 168 hours.  BNP Recent Labs  Lab 12/06/22 1814 12/08/22 0159  BNP 143.5* 149.2*    DDimer No results for input(s): "DDIMER" in the last 168 hours.   Radiology    DG CHEST PORT 1 VIEW  Result Date: 12/08/2022 CLINICAL DATA:  Dyspnea. EXAM: PORTABLE CHEST 1 VIEW COMPARISON:  12/06/2022 FINDINGS: The cardio pericardial silhouette is enlarged. Diffuse interstitial and basilar airspace disease is mildly progressive in the interval. Mitral clips and CardioMEMS pulmonary arterial  device again noted Bones are diffusely demineralized. Telemetry leads overlie the chest. IMPRESSION: Interval progression of diffuse interstitial and basilar airspace disease, likely edema. Electronically Signed   By: Kennith Center M.D.   On: 12/08/2022 07:25   DG Chest Portable 1 View  Result Date: 12/06/2022 CLINICAL DATA:  Left chest pain radiating to the left arm EXAM: PORTABLE CHEST 1 VIEW COMPARISON:  09/07/2022 FINDINGS: Mitral clips are obscured by overlying ECG lead. Coronary atherosclerosis noted. A small device projecting over the left lower lobe pulmonary artery is compatible with a CardioMEMS type pulmonary arterial pressure monitor. The patient is rotated to the right on today's radiograph, reducing diagnostic sensitivity and specificity. Moderate cardiomegaly. Mild cephalization of blood flow, nonspecific on semi erect positioning, but pulmonary venous hypertension is not excluded. No overt edema. No blunting of the lateral costophrenic angles. Chronic deformity of the left proximal humerus likely from healed fracture. IMPRESSION: 1. Moderate cardiomegaly with mild cephalization of blood flow, nonspecific on semi erect positioning, but pulmonary venous hypertension is not excluded. No overt edema. 2. Mitraclips are obscured by overlying ECG lead. Pulmonary arterial pressure monitor noted. 3. Coronary atherosclerosis. 4. Chronic deformity of the left proximal humerus likely from healed fracture. Electronically Signed   By: Gaylyn Rong M.D.   On: 12/06/2022 11:37    Cardiac Studies   Cardiac Studies & Procedures   CARDIAC CATHETERIZATION  CARDIAC CATHETERIZATION 09/07/2022  Narrative   Mid LAD to Dist LAD lesion is 60% stenosed.   Prox RCA lesion is 60% stenosed.   1st Mrg-1 lesion is 30% stenosed.   Mid Cx-2 lesion is 100% stenosed.   1st Mrg-2 lesion is 60% stenosed.   Prox LAD to Mid LAD lesion is 90% stenosed.   A drug-eluting stent was successfully placed using a SYNERGY  XD 2.75X28.   Post intervention, there is a 0% residual stenosis.   Mid LAD lesion is 40% stenosed.   Non-stenotic Mid Cx-1 lesion was previously treated.   Non-stenotic Dist RCA lesion was previously treated.   Post intervention, there is a 0% residual stenosis.   LV end diastolic pressure is mildly elevated.  2 vessel obstructive CAD. New 90% stenosis in the proximal LAD with RFR 0.48. The stent in the LCx is occluded after a large first OM Patent stent in the distal RCA Mildly elevated  LVEDP 21 mm Hg Successful PCI of the proximal LAD with DES x 1 overlapping with prior stent.  Plan: observe overnight. Anticipate DC in am. DAPT indefinitely with ASA/Plavix.  Will treat LCx occlusion medically.  Findings Coronary Findings Diagnostic  Dominance: Right  Left Main No significant disease.  Left Anterior Descending The LAD is diffusely diseased. The proximal vessel has nonobstructive disease. The mid vessel stent is patent. The mid/distal vessel has 60% stenosis beyond the stent. The distal vessel is diffusely diseased. Prox LAD to Mid LAD lesion is 90% stenosed. The lesion is moderately calcified. Pressure wire/FFR was performed on the lesion. RFR 0.48 Mid LAD lesion is 40% stenosed. The lesion was previously treated . Mid LAD to Dist LAD lesion is 60% stenosed.  Left Circumflex Large OM1.  30% mid LCx stenosis after OM1.  Moderate OM2 with 30-40% proximal stenosis.  30% distal LCx stenosis. Non-stenotic Mid Cx-1 lesion was previously treated. The lesion is moderately calcified. Mid Cx-2 lesion is 100% stenosed.  First Obtuse Marginal Branch 1st Mrg-1 lesion is 30% stenosed. The lesion was previously treated . 1st Mrg-2 lesion is 60% stenosed.  Third Obtuse Marginal Branch Collaterals 3rd Mrg filled by collaterals from RPA[facility].  Right Coronary Artery There is moderate diffuse disease throughout the vessel. Serial 30-40% stenoses in the proximal RCA.  Long stented segment in the  PDA was patent. Prox RCA lesion is 60% stenosed. The lesion is calcified. Non-stenotic Dist RCA lesion was previously treated.  Intervention  Prox LAD to Mid LAD lesion Stent (Also treats lesions: Mid LAD) CATH VISTA GUIDE 6FR XBLAD3.5 guide catheter was inserted. Lesion crossed with guidewire using a WIRE ASAHI PROWATER 180CM. Pre-stent angioplasty was performed using a BALLN SCOREFLEX 2.50X10. A drug-eluting stent was successfully placed using a SYNERGY XD 2.75X28. Stent strut is well apposed. Stent overlaps previously placed stent. Post-stent angioplasty was performed using a BALL SAPPHIRE NC24 [id number]. Maximum pressure:  20 atm. Post-Intervention Lesion Assessment The intervention was successful. Pre-interventional TIMI flow is 3. Post-intervention TIMI flow is 3. No complications occurred at this lesion. There is a 0% residual stenosis post intervention.  Mid LAD lesion Stent (Also treats lesions: Prox LAD to Mid LAD) See details in Prox LAD to Mid LAD lesion. Post-Intervention Lesion Assessment The intervention was successful. Pre-interventional TIMI flow is 3. Post-intervention TIMI flow is 3. No complications occurred at this lesion. There is a 0% residual stenosis post intervention.   CARDIAC CATHETERIZATION  CARDIAC CATHETERIZATION 05/18/2021  Narrative 1.  Status post transcatheter mitral edge-to-edge repair with placement of 1 XT W clip and 1 NT W clip.  The NT W clip was entangled and had to be deployed in the lateral part of the valve.  No further clip placement could be pursued due to the proximity of both clips.  The final V waves were 52 mmHg with residual moderate to severe mitral regurgitation.  The results were reviewed with Dr. Gala Romney.     ECHOCARDIOGRAM  ECHOCARDIOGRAM COMPLETE 07/18/2022  Narrative ECHOCARDIOGRAM REPORT    Patient Name:   JONIE BAUERS Date of Exam: 07/18/2022 Medical Rec #:  [id number]      Height:       64.0 in Accession #:    [id number]      Weight:       234.0 lb Date of Birth:  05/25/1961      BSA:          2.091 m Patient Age:    87 years  BP:           122/60 mmHg Patient Gender: F              HR:           70 bpm. Exam Location:  [address]  Procedure: 2D Echo, Cardiac Doppler, Color Doppler and Intracardiac Opacification Agent  Indications:    Z98.890 S/P Mitral valve clip implantation; I34.0 Mitral valve insufficiency  History:        Patient has prior history of Echocardiogram examinations, most recent 09/18/2021. CHF, CAD; Risk Factors:Diabetes, Dyslipidemia and Sleep Apnea. S/P ablation of atrial fibrillation. Chronic pain syndrome. Obesity. PVD.  Mitral Valve: Mitra-Clip valve is present in the mitral position. Procedure Date: 05/18/2021.  Sonographer:    Cathie Beams RCS Referring Phys: Fritzi Mandes COX  IMPRESSIONS   1. Left ventricular ejection fraction, by estimation, is 55 to 60%. The left ventricle has normal function. The left ventricle has no regional wall motion abnormalities. Indeterminate diastolic filling due to E-A fusion. 2. Right ventricular systolic function is normal. The right ventricular size is normal. 3. Left atrial size was mildly dilated. 4. The mitral valve has been repaired/replaced. Moderate mitral valve regurgitation. The mean mitral valve gradient is 7.5 mmHg with average heart rate of 75 bpm. There is a Mitra-Clip present in the mitral position. Procedure Date: 05/18/2021. 5. The aortic valve is grossly normal. Aortic valve regurgitation is not visualized. No aortic stenosis is present. 6. The inferior vena cava is normal in size with greater than 50% respiratory variability, suggesting right atrial pressure of 3 mmHg.  Comparison(s): No significant change from prior study.  Conclusion(s)/Recommendation(s): S/P Mitraclip x2. Study unchanged from prior.  FINDINGS Left Ventricle: Left ventricular ejection fraction, by estimation, is 55 to 60%. The left ventricle has normal  function. The left ventricle has no regional wall motion abnormalities. Definity contrast agent was given IV to delineate the left ventricular endocardial borders. The left ventricular internal cavity size was normal in size. There is no left ventricular hypertrophy. Indeterminate diastolic filling due to E-A fusion.  Right Ventricle: The right ventricular size is normal. No increase in right ventricular wall thickness. Right ventricular systolic function is normal.  Left Atrium: Left atrial size was mildly dilated.  Right Atrium: Right atrial size was normal in size.  Pericardium: There is no evidence of pericardial effusion.  Mitral Valve: The mitral valve has been repaired/replaced. Moderate mitral valve regurgitation. There is a Mitra-Clip present in the mitral position. Procedure Date: 05/18/2021. MV peak gradient, 20.8 mmHg. The mean mitral valve gradient is 7.5 mmHg with average heart rate of 75 bpm.  Tricuspid Valve: The tricuspid valve is normal in structure. Tricuspid valve regurgitation is trivial. No evidence of tricuspid stenosis.  Aortic Valve: The aortic valve is grossly normal. Aortic valve regurgitation is not visualized. No aortic stenosis is present.  Pulmonic Valve: The pulmonic valve was not well visualized. Pulmonic valve regurgitation is trivial. No evidence of pulmonic stenosis.  Aorta: The aortic root, ascending aorta, aortic arch and descending aorta are all structurally normal, with no evidence of dilitation or obstruction.  Venous: The inferior vena cava is normal in size with greater than 50% respiratory variability, suggesting right atrial pressure of 3 mmHg.  IAS/Shunts: The atrial septum is grossly normal.   LEFT VENTRICLE PLAX 2D LVIDd:         5.10 cm LVIDs:         3.30 cm LV PW:  0.80 cm LV IVS:        0.80 cm LVOT diam:     1.90 cm LV SV:         72 LV SV Index:   34 LVOT Area:     2.84 cm   RIGHT VENTRICLE TAPSE (M-mode): 2.0  cm  LEFT ATRIUM             Index        RIGHT ATRIUM           Index LA diam:        4.00 cm 1.91 cm/m   RA Area:     13.30 cm LA Vol (A2C):   59.5 ml 28.45 ml/m  RA Volume:   29.40 ml  14.06 ml/m LA Vol (A4C):   76.2 ml 36.44 ml/m LA Biplane Vol: 68.2 ml 32.61 ml/m AORTIC VALVE LVOT Vmax:   102.00 cm/s LVOT Vmean:  68.600 cm/s LVOT VTI:    0.253 m  AORTA Ao Root diam: 3.00 cm Ao Asc diam:  3.10 cm  MITRAL VALVE MV Area (PHT): 3.54 cm     SHUNTS MV Area VTI:   1.30 cm     Systemic VTI:  0.25 m MV Peak grad:  20.8 mmHg    Systemic Diam: 1.90 cm MV Mean grad:  7.5 mmHg MV Vmax:       2.28 m/s MV Vmean:      125.5 cm/s MV Decel Time: 214 msec MR Peak grad: 116.2 mmHg MR Mean grad: 75.0 mmHg MR Vmax:      539.00 cm/s MR Vmean:     408.0 cm/s MV E velocity: 221.00 cm/s MV A velocity: 114.00 cm/s MV E/A ratio:  1.94  Jodelle Red MD Electronically signed by Jodelle Red MD Signature Date/Time: 07/18/2022/4:46:03 PM    Final   TEE  ECHO TEE 05/18/2021  Narrative TRANSESOPHOGEAL ECHO REPORT    Patient Name:   APRILL RAMUS Date of Exam: 05/18/2021 Medical Rec #:  [id number]      Height:       64.5 in Accession #:    [id number]     Weight:       231.5 lb Date of Birth:  1961-01-27      BSA:          2.093 m Patient Age:    60 years       BP:           105/41 mmHg Patient Gender: F              HR:           65 bpm. Exam Location:  Inpatient  Procedure: 3D Echo, Cardiac Doppler, Color Doppler and Transesophageal Echo  Indications:     Severe mitral regurgitation [[id number]]  History:         Patient has prior history of Echocardiogram examinations, most recent 05/02/2021. CHF, Previous Myocardial Infarction and CAD, Arrythmias:Atrial Fibrillation, Signs/Symptoms:Dyspnea; Risk Factors:Hypertension, Dyslipidemia, Diabetes and Sleep Apnea. Chronic kidney disease.  Mitral Valve: Mitral clip XT W, NT W valve is present in the mitral position.  Procedure Date: 05/18/2021.  Sonographer:     Leta Jungling RDCS Referring Phys:  [id number] Orbie Pyo Diagnosing Phys: Thurmon Fair MD  PROCEDURE: After discussion of the risks and benefits of a TEE, an informed consent was obtained from the patient. The patient was intubated. The transesophogeal probe was passed without difficulty through the esophogus of the patient. Imaged were obtained  with the patient in a supine position. Sedation performed by different physician. The patient was monitored while under deep sedation. Anesthestetic sedation was provided intravenously by Anesthesiology: 150mg  of Propofol. Image quality was good. The patient developed no complications during the procedure.  TEE with live 3D and 3D postprocessing was performed throughout all stages of the procedure, including transseptal puncture, device deployment, assessment of final results and evaluation for complications  PREPROCEDURE FINDINGS:  At baseline, left ventricular systolic function is normal. Estimated LVEF is 65%. There is hypokinesis of the basal inferolateral wall. There is severe mitral insuficiency with a central jet. There is malcoaptation of the leaflets, primarily due to tethering of the posterior leaflet. Baseline valve area by planimetry 5.7 cm sq. Mean gradient 3 mm Hg at 60 bpm. ERO area by 3D PISA was 0.7 cm sq. Regurgitant volume 132 ml, regurgitant fraction 65%. There is bilateral blunting of pulmonary vein systolic flow. There is a physiological amount of pericardial fluid.  POSTPROCEDURE FINDINGS:  After deployment of the initial MitraClip XTW, the device is well seated with excellent tissue bridge, but there is residual moderate mitral insufficiency lateral to the clip.  A second device, Mitraclip NTW was advanced lateral to the original one. Difficulty was encountered in positioning of the second clip and it became apparent that the anterior leaflet was attached to the second clip,  pulling it towards the posterior arm of the clip. Despite extensive efforts, the device could not be freed from the anterior leaflet, nor could the posterior leaflet be grasped. The device was deployed attached only to the anterior leaflet. Left ventricular systolic function remained normal. Estimated LVEF is 65%. There is unchanged hypokinesis of the basal inferolateral wall. Both clips appear stable in their position. There is moderate to severe mitral insuficiency with major regurgitant jets between the two clips and lateral to the second clip. There is a small iatrogenic secundum atrial septal defect with exclusively left-to-right shunt. With the caveats related to regurgitation evaluation using the continuity equation, regurgitant volume was 68 ml, regurgitant fraction 45%, calculated ERO area at the end of the procedure 0.5 cm sq. There is an unchanged physiological amount of pericardial fluid.    IMPRESSIONS   1. Left ventricular ejection fraction, by estimation, is 65%%. The left ventricle has normal function. The left ventricle demonstrates regional wall motion abnormalities (see scoring diagram/findings for description). Left ventricular diastolic parameters are consistent with Grade II diastolic dysfunction (pseudonormalization). Elevated left atrial pressure. 2. Right ventricular systolic function is normal. The right ventricular size is normal. 3. Left atrial size was mildly dilated. No left atrial/left atrial appendage thrombus was detected. 4. Right atrial size was mildly dilated. 5. The mitral valve is abnormal. Severe mitral valve regurgitation. There is a Mitral clip XT W, NT W present in the mitral position. Procedure Date: 05/18/2021. 6. The aortic valve is tricuspid. Aortic valve regurgitation is not visualized. 7. Evidence of atrial level shunting detected by color flow Doppler. There is a small patent foramen ovale with predominantly left to right shunting across the  atrial septum.  FINDINGS Left Ventricle: Left ventricular ejection fraction, by estimation, is 65%%. The left ventricle has normal function. The left ventricle demonstrates regional wall motion abnormalities. The left ventricular internal cavity size was normal in size. There is no left ventricular hypertrophy. Left ventricular diastolic parameters are consistent with Grade II diastolic dysfunction (pseudonormalization).  Right Ventricle: The right ventricular size is normal. No increase in right ventricular wall thickness. Right ventricular systolic  function is normal.  Left Atrium: Left atrial size was mildly dilated. No left atrial/left atrial appendage thrombus was detected.  Right Atrium: Right atrial size was mildly dilated.  Pericardium: Trivial pericardial effusion is present.  Mitral Valve: The mitral valve is abnormal. Severe mitral valve regurgitation. There is a Mitral clip XT W, NT W present in the mitral position. Procedure Date: 05/18/2021.  Tricuspid Valve: The tricuspid valve is normal in structure. Tricuspid valve regurgitation is trivial.  Aortic Valve: The aortic valve is tricuspid. Aortic valve regurgitation is not visualized.  Pulmonic Valve: The pulmonic valve was normal in structure. Pulmonic valve regurgitation is not visualized.  Aorta: The aortic root, ascending aorta and aortic arch are all structurally normal, with no evidence of dilitation or obstruction.  IAS/Shunts: Evidence of atrial level shunting detected by color flow Doppler. A small patent foramen ovale is detected with predominantly left to right shunting across the atrial septum.   LEFT VENTRICLE PLAX 2D LVOT diam:     2.10 cm LV SV:         67 LV SV Index:   32 LVOT Area:     3.46 cm   AORTIC VALVE LVOT Vmax:   78.40 cm/s LVOT Vmean:  57.600 cm/s LVOT VTI:    0.194 m  MR Peak grad:      111.3 mmHg MR Mean grad:      84.0 mmHg    SHUNTS MR Vmax:           527.50 cm/s  Systemic VTI:   0.19 m MR Vmean:          448.0 cm/s   Systemic Diam: 2.10 cm MR Vena Contracta: 0.52 cm MR PISA:           2.52 cm MR PISA Radius:    0.63 cm  Mihai Croitoru MD Electronically signed by Thurmon Fair MD Signature Date/Time: 05/18/2021/3:05:55 PM    Final   MONITORS  LONG TERM MONITOR (3-14 DAYS) 04/12/2021  Narrative Patch Wear Time:  14 days and 0 hours (2022-08-31T12:08:59-0400 to 2022-09-14T12:09:09-398)  1. Sinus rhythm - min HR of 51 bpm, max HR of 190 bpm, and avg HR of 70 bpm. Predominant 2. 2. One run of nonsustained Ventricular Tachycardia occurred lasting 5 beats with a max rate of 171 bpm (avg 128 bpm). 3. 66 runs of Supraventricular Tachycardia runs occurred, the run with the fastest interval lasting 5 beats with a max rate of 190 bpm, the longest lasting 16.6 secs with an avg rate of 125 bpm.  4. 4. Frequent PACs (11.2%, [id number]) 5. Rare PVCs  Arvilla Meres, MD 3:53 PM            Patient Profile     Dana [name] is a 62 y.o. female with a hx of chronic diastolic HF, cardiomems 02/2018, CAD with hx DES RCA and LAD -2018, 2022 DES mLCX, and PTCA in side branch of OM1 DM, HTN, HLD, PAD, OSA on CPAP, PAF s/p ablation, PAD s/p L great toe and 4th toe amputation, stage IIIb CKD who is being seen for the evaluation and management of chest pain.  Assessment & Plan    NSTEMI CAD With new lateral ST changes; likely related to LCX disease - on nitro drip, Coreg 12.5 mg PO BID - on heparin - on ASA and Brilinta - complicated by worsening AKI and increase creatinine - will uptriage to a higher level of care - recommend nephrology consult  -case was discussed with IV  on call last night and early AM; discussed high risk LHC with IC today  Risks and benefits of cardiac catheterization have been discussed with the patient.  These include bleeding, infection, kidney damage, stroke, heart attack, death, potential need for dialysis.  The patient understands these risks  and is willing to proceed.   Access recommendations: R femoral Procedural considerations GFR of 18; she and family are aware that contrast from percutaneous intervention could lead to HD  AKI on CKD IIIb UTI - Home torsemide held; her home hydralazine has been held for hypotension  Paroxysmal atrial fibrillation - Hx PAF s/p ablation >10 years ago no further not on AC; on BB  Hyperlipidemia - statin intolerant on  Repatha and Zetia.   Chronic diastolic CHF - sees AHF (DB) and is s/p CardiMEMS - table per CardioMEMs monitor.  - diuresis as above  Mitral regurgitation  - no evidence of SLDA; will repeat echocardiogram given worsening clinical status  CRITICAL CARE Performed by: Antwaun Buth A Marlicia Sroka  Total critical care time: 77 minutes. Critical care time was exclusive of separately billable procedures and treating other patients. Critical care was necessary to treat or prevent imminent or life-threatening deterioration. Critical care was time spent personally by me on the following activities: development of treatment plan with patient and/or surrogate as well as nursing, discussions with consultants, evaluation of patient's response to treatment, examination of patient, obtaining history from patient or surrogate, ordering and performing treatments and interventions, ordering and review of laboratory studies, ordering and review of radiographic studies, pulse oximetry and re-evaluation of patient's condition.    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 She received 75 cc contrast. SOB remains otherwise well. Will give 40 IV Lasix X1 in PM Would wean masked.  IC team  to pull R femoral sheath then restart heparin. Family updated  Riley Lam, MD FASE [facility] Cardiologist [facility]  [address] [place], [address] [place], [place] [address] [phone]  1:57 PM  S/p Sheath pull without hematoma. S/p 650 CC out with improved breathing. Feels some SOB while supine.  BP on monitor 89/54; radial pulse +2.  Repeat BP 124/59. Still on Nitro drip with no CP; drip at 30 . Legs warm and perfused. Will give 40 IV lasix at 1800. Will work to wean the nitro drip. Doing better clinically. Low threshold for high level of care.  Will sign out to our PM colleagues.  CRITICAL CARE Performed by: Scotland Korver A Sayler Mickiewicz  Total critical care time: 30 minutes. Critical care time was exclusive of separately billable procedures and treating other patients. Critical care was necessary to treat or prevent imminent or life-threatening deterioration. Critical care was time spent personally by me on the following activities: development of treatment plan with patient and/or surrogate as well as nursing, discussions with consultants, evaluation of patient's response to treatment, examination of patient, obtaining history from patient or surrogate, ordering and performing treatments and interventions, ordering and review of laboratory studies, ordering and review of radiographic studies, pulse oximetry and re-evaluation of patient's condition.    Signed, Riley Lam, MD FASE [name]  [facility]  [facility]  12/08/2022 4:57 PM

## 2022-12-08 NOTE — Interval H&P Note (Signed)
History and Physical Interval Note:  12/08/2022 10:34 AM  Dana [name]  has presented today for surgery, with the diagnosis of LEFT HEART CATHETERIZATION WITH CORONARY ANGIOGRAPHY AND PERCUTANEOUS CORONARY INTERVENTION  The various methods of treatment have been discussed with the patient and family. After consideration of risks, benefits and other options for treatment, the patient has consented to  * No surgery found * as a surgical intervention.  The patient's history has been reviewed, patient examined, no change in status, stable for surgery.  I have reviewed the patient's chart and labs.  Questions were answered to the patient's satisfaction.    Cath Lab Visit (complete for each Cath Lab visit)  Clinical Evaluation Leading to the Procedure:   ACS: Yes.    Non-ACS:    Anginal Classification: CCS IV  Anti-ischemic medical therapy: Maximal Therapy (2 or more classes of medications)  Non-Invasive Test Results: No non-invasive testing performed  Prior CABG: No previous CABG    Bryan Lemma

## 2022-12-08 NOTE — Progress Notes (Addendum)
ANTICOAGULATION CONSULT NOTE - Initial Consult  Pharmacy Consult for heparin Indication: chest pain/ACS  Allergies  Allergen Reactions   Naproxen Hives and Rash   Shellfish-Derived Products Hives, Swelling and Other (See Comments)    Angioedema    Strawberry (Diagnostic) Hives   Celecoxib Other (See Comments)    Stomach pain   Glucosamine Hives, Swelling and Other (See Comments)    Angioedema    Iron     IV iron transfusion - hives - Feb 2022 hospitalization   Metoclopramide Other (See Comments)    Facial twitching and stuttering   Metolazone Other (See Comments)    Acute renal failure   Statins Other (See Comments)    Muscle pain    Patient Measurements: Height: 5\' 4"  (162.6 cm) Weight: 107.4 kg (236 lb 12.4 oz) IBW/kg (Calculated) : 54.7 Heparin Dosing Weight: 79.8kg  Vital Signs: Temp: 98.5 F (36.9 C) (05/25 0746) Temp Source: Oral (05/25 0746) BP: 133/61 (05/25 0722) Pulse Rate: 82 (05/25 0329)  Labs: Recent Labs    12/06/22 1045 12/06/22 1304 12/07/22 0253 12/07/22 0620 12/07/22 2258 12/08/22 0159 12/08/22 0802  HGB 9.3*  --  9.2*  --   --  8.9*  --   HCT 29.7*  --  29.5*  --   --  28.2*  --   PLT 235  --  221  --   --  238  --   HEPARINUNFRC  --   --   --   --   --   --  0.26*  CREATININE 2.73*  --  2.74*  --   --  2.89*  --   TROPONINIHS 8   < >  --  8 10 628*  --    < > = values in this interval not displayed.     Estimated Creatinine Clearance: 25.1 mL/min (A) (by C-G formula based on SCr of 2.89 mg/dL (H)).   Medical History: Past Medical History:  Diagnosis Date   Acquired absence of left great toe (HCC)    Acquired absence of other left toe(s) (HCC)    ACS (acute coronary syndrome) (HCC)    Anemia    Anoxic brain injury (HCC)    Anxiety    CAD (coronary artery disease)    DES mLAD 04/07/15; DES dRCA & DES pPDA 08/30/16; DES mCX & PTCA OM1 09/29/20   Chronic diastolic (congestive) heart failure (HCC)    Chronic pain    Chronic  systolic (congestive) heart failure (HCC)    CKD (chronic kidney disease)    Contrast dye induced nephropathy, possible 09/03/2020   COPD (chronic obstructive pulmonary disease) (HCC)    Diabetes (HCC)type 1    Diastolic CHF (HCC)    Dyspnea    Family history of breast cancer 10/23/2021   Gastro-esophageal reflux disease without esophagitis    Hyperlipidemia    Hypertension    Hypertensive heart disease with heart failure (HCC)    Hypoxemia    Idiopathic gout, multiple sites    Leukocytosis 03/29/2022   Localized edema    Low back pain    Mitral regurgitation    Mitral regurgitation    Mixed hyperlipidemia    Mixed incontinence    Morbid (severe) obesity due to excess calories (HCC)    Neuromuscular disorder (HCC)    Neuropathy    Non-ST elevation (NSTEMI) myocardial infarction (HCC)    08/29/20, 09/29/20   OSA (obstructive sleep apnea) 09/03/2020   Other specified hypothyroidism    PAF (paroxysmal  atrial fibrillation) (HCC)    s/p pulmonary vein isolation by cryoablation 10/04/15 Dr. Rudolpho Sevin in [facility]   PEA (Pulseless electrical activity) [facility])    ~ 01/13/21 at [facility] during admission DKA, volume overload, possible CAP, hypoxia s/p ACLS with ROSC ~ 10-15   Pneumonia    Primary insomnia    Rheumatoid arthritis (HCC)    Stroke (HCC)    Thyroid disease    Type 1 diabetes mellitus (HCC)    Vitamin D deficiency     Medications:  Medications Prior to Admission  Medication Sig Dispense Refill Last Dose   allopurinol (ZYLOPRIM) 300 MG tablet Take 1 tablet (300 mg total) by mouth daily. 90 tablet 1 12/06/2022   aspirin EC 81 MG tablet Take 1 tablet (81 mg total) by mouth daily. Swallow whole. 30 tablet 12 12/06/2022   busPIRone (BUSPAR) 5 MG tablet Take 1 tablet (5 mg total) by mouth 3 (three) times daily. 270 tablet 1 12/06/2022   carvedilol (COREG) 12.5 MG tablet TAKE ONE TABLET BY MOUTH TWICE DAILY (Patient taking differently: Take 12.5 mg by mouth 2 (two) times daily  with a meal.) 180 tablet 3 12/06/2022 at 0800   cephALEXin (KEFLEX) 250 MG capsule Take 250 mg by mouth at bedtime.   12/05/2022   clopidogrel (PLAVIX) 75 MG tablet Take 1 tablet (75 mg total) by mouth daily. 90 tablet 1 12/05/2022   CRANBERRY PO Take 2 tablets by mouth daily.   12/06/2022   estradiol (ESTRACE) 0.1 MG/GM vaginal cream Place 1 Applicatorful vaginally every other day.   12/05/2022   Evolocumab (REPATHA SURECLICK) 140 MG/ML SOAJ Inject 1 Dose into the skin every 14 (fourteen) days. 6 mL 1 Past Week   ezetimibe (ZETIA) 10 MG tablet Take 1 tablet (10 mg total) by mouth daily. 90 tablet 1 12/05/2022   Furosemide (FUROSCIX [place]) Inject into the skin.      hydrALAZINE (APRESOLINE) 25 MG tablet Take 1 tablet (25 mg total) by mouth 3 (three) times daily. 90 tablet 8 12/06/2022   HYDROcodone-acetaminophen (NORCO) 7.5-325 MG tablet TAKE ONE TABLET BY MOUTH THREE TIMES DAILY AS NEEDED (Patient taking differently: Take 1 tablet by mouth 3 (three) times daily as needed for moderate pain.) 90 tablet 0 12/06/2022   insulin aspart (NOVOLOG FLEXPEN) 100 UNIT/ML FlexPen 2-10 U before meals based on SSI. 15 mL 3 12/06/2022   isosorbide mononitrate (IMDUR) 30 MG 24 hr tablet Take 1 tablet (30 mg total) by mouth daily. 90 tablet 1 12/06/2022   latanoprost (XALATAN) 0.005 % ophthalmic solution Place 1 drop into both eyes at bedtime.   12/05/2022   LEVEMIR FLEXPEN 100 UNIT/ML FlexPen Inject 50 units into THE SKIN daily (Patient taking differently: Inject 50 Units into the skin daily.) 15 mL 1 12/06/2022   linaclotide (LINZESS) 145 MCG CAPS capsule Take 1 capsule (145 mcg total) by mouth daily before breakfast. 90 capsule 1 12/06/2022   LORazepam (ATIVAN) 0.5 MG tablet TAKE ONE TABLET BY MOUTH ONCE DAILY AS NEEDED FOR ANXIETY (Patient taking differently: Take 0.5 mg by mouth every 6 (six) hours as needed for anxiety.) 30 tablet 2 12/06/2022   magnesium oxide (MAG-OX) 400 MG tablet Take 2 tablets (800 mg total) by mouth  daily. Take 2 (400 mg) daily=800 mg 60 tablet 1 12/06/2022   nitroGLYCERIN (NITROSTAT) 0.4 MG SL tablet Dissolve 1 tab under tongue as needed for chest pain. May repeat every 5 minutes x 2 doses. If no relief call 9-1-1. Please call [phone]  to schedule an appointment for future refills. Thank you. 50 tablet 0 12/06/2022   Omega-3 Fatty Acids (FISH OIL PO) Take 1 capsule by mouth daily.   12/06/2022   OXYGEN Inhale 2 L into the lungs at bedtime.   12/06/2022   pantoprazole (PROTONIX) 40 MG tablet Take 1 tablet (40 mg total) by mouth daily. 90 tablet 1 12/06/2022   potassium chloride SA (KLOR-CON M) 20 MEQ tablet TAKE ONE TABLET BY MOUTH ONCE daily 90 tablet 1 12/06/2022   Probiotic CAPS Take 1 capsule by mouth daily.   12/06/2022   promethazine (PHENERGAN) 25 MG tablet Take 1 tablet (25 mg total) by mouth daily as needed. (Patient taking differently: Take 25 mg by mouth daily as needed for nausea or vomiting.) 30 tablet 3 12/06/2022   sulfamethoxazole-trimethoprim (BACTRIM DS) 800-160 MG tablet Take 1 tablet by mouth 2 (two) times daily.   12/06/2022   torsemide (DEMADEX) 20 MG tablet TAKE 4 TABLETS BY MOUTH ONCE DAILY (Patient taking differently: Take 80 mg by mouth daily.) 336 tablet 3 12/06/2022   Continuous Blood Gluc Sensor (FREESTYLE LIBRE 2 SENSOR) MISC USE TO check blood glucose AS DIRECTED AND CHANGE sensor every 14 DAYS      EPINEPHrine 0.3 mg/0.3 mL IJ SOAJ injection Use as directed for life-threatening allergic reaction. 4 Device 3    Insulin Pen Needle (BD PEN NEEDLE NANO 2ND GEN) 32G X 4 MM MISC Inject 1 each into the skin in the morning, at noon, in the evening, and at bedtime. 600 each 3    Scheduled:   sodium chloride   Intravenous Once   allopurinol  300 mg Oral Daily   aspirin EC  81 mg Oral Daily   busPIRone  5 mg Oral TID   carvedilol  12.5 mg Oral BID   [START ON 12/12/2022] cephALEXin  250 mg Oral QHS   cephALEXin  500 mg Oral Q12H   ezetimibe  10 mg Oral Daily   insulin aspart   0-15 Units Subcutaneous TID WC   insulin aspart  0-5 Units Subcutaneous QHS   insulin detemir  30 Units Subcutaneous Daily   isosorbide mononitrate  60 mg Oral Daily    morphine injection  1 mg Intravenous Once   pantoprazole  40 mg Oral Daily   ticagrelor  90 mg Oral BID   Infusions:   heparin 1,100 Units/hr (12/08/22 0039)   nitroGLYCERIN 110 mcg/min (12/08/22 0723)    Assessment: 61yo female was initially admitted 5/23 for CP with negative troponins; this evening she c/o 7/10 CP that was relieved to 3/10 after NTGx3, then 1h later CP was at 9/10 and pt reports pain radiating down LUE, now started on NTG infusion with plan to load with Brilinta and start UFH; troponin in process now.  Heparin level is subtherapeutic at 0.26 on 1100 units/hour. Hgb 8.9, plt are WNL. Per RN, heparin infusion was paused on 5/25 from 0350 to 0720 while patient received 1 unit PRBC. No signs/symptoms of bleeding noted. Heparin level drawn shortly after infusion was restarted which may have caused level to be falsely low. Will give bolus, increase infusion rate, and recheck level this afternoon.  Goal of Therapy:  Heparin level 0.3-0.7 units/ml Monitor platelets by anticoagulation protocol: Yes   Plan:  Give heparin 1000 unit bolus x1 Increase heparin infusion to 1250 units/hour Check heparin level in 6-hour Monitor daily heparin levels, CBC, and signs/symptoms of bleeding  Thank you for involving pharmacy in this patient's care.  Rockwell Alexandria, PharmD PGY1 Pharmacy Resident 12/08/2022 9:27 AM  __________________________________________________________________________________________ 12/08/22 1408 Update  Patient was taken emergently to cath lab after developing chest pain and shortness of breath and received a stent to the LAD. MD has ordered to continue heparin infusion 8 hours post-sheath removal (sheath removed at 11:55 AM per procedure log).  Plan: Start heparin 1250 units/hour at 2000  tonight Check 6-hour heparin level Monitor daily heparin levels, CBC, and signs/symptoms of bleeding  Thank you for involving pharmacy in this patient's care.   Rockwell Alexandria, PharmD PGY1 Pharmacy Resident 12/08/2022 2:13 PM

## 2022-12-08 NOTE — Progress Notes (Signed)
Holding 40mg  lasix dose this AM d/t pt Cr trending up and currently elevated at 2.89. Per MD nephrology consulted.

## 2022-12-08 NOTE — Consult Note (Signed)
[facility] Nephrology Consultation Note  Requesting MD: Dr. Dorcas Carrow Reason for consult: AKI on CKD  HPI:  Dana [name] is a 62 y.o. female with past medical history significant for CAD status post stent, chronic diastolic CHF, type I DM, HTN, HLD, PAD, OSA on CPAP, PAF status post ablation, PAD, CKD stage IIIb, presented with chest pain, and a NTEMI seen as a consultation for AKI on CKD with the need of cardiac cath. Per the chest pain she was evaluated by cardiologist thought to be due to an NSTEMI with new lateral ST changes.  Treated with nitro, Coreg, heparin, aspirin and Brilinta.  The plan is to do cardiac cath today because of ongoing chest pain.  The Bactrim and torsemide is on hold today. The creatinine level was 2.73 on admission on 5/23 which was slightly up trended to 2.89 today.  Urine output recorded 650 cc.  Diuretics was held today and discontinued Bactrim.  For CKD, she follows with Dr. Elenore Rota at [facility] for CKD IIIB-IV which seems to have worsened since mitral valve clip procedure done in 2022 with preceding cardiac cath and adjustment of diuretics.  She has had multiple hospitalization before with contrast exposure leading to contrast nephropathy with peak creatinine level of hyper 5 in 2022.  Recent baseline creatinine level anywhere between 1.8-2.3 also history of proteinuria in the past however the RAAS blockers was held because of recurrent AKI. The patient takes torsemide 80 mg daily at home for CHF. She also has history of secondary hyperparathyroidism, vitamin D deficiency, anemia.  Receiving Procrit every 2 weeks by hematologist for the management of anemia due to CKD.  During my evaluation this morning, patient was complaining of chest pain and awaiting to go to Cath Lab.  She was accompanied by her mother and daughter at the bedside.  Mild shortness of breath.  No nausea or vomiting.   PMHx:   Past Medical History:  Diagnosis Date    Acquired absence of left great toe (HCC)    Acquired absence of other left toe(s) (HCC)    ACS (acute coronary syndrome) (HCC)    Anemia    Anoxic brain injury (HCC)    Anxiety    CAD (coronary artery disease)    DES mLAD 04/07/15; DES dRCA & DES pPDA 08/30/16; DES mCX & PTCA OM1 09/29/20   Chronic diastolic (congestive) heart failure (HCC)    Chronic pain    Chronic systolic (congestive) heart failure (HCC)    CKD (chronic kidney disease)    Contrast dye induced nephropathy, possible 09/03/2020   COPD (chronic obstructive pulmonary disease) (HCC)    Diabetes (HCC)type 1    Diastolic CHF (HCC)    Dyspnea    Family history of breast cancer 10/23/2021   Gastro-esophageal reflux disease without esophagitis    Hyperlipidemia    Hypertension    Hypertensive heart disease with heart failure (HCC)    Hypoxemia    Idiopathic gout, multiple sites    Leukocytosis 03/29/2022   Localized edema    Low back pain    Mitral regurgitation    Mitral regurgitation    Mixed hyperlipidemia    Mixed incontinence    Morbid (severe) obesity due to excess calories (HCC)    Neuromuscular disorder (HCC)    Neuropathy    Non-ST elevation (NSTEMI) myocardial infarction (HCC)    08/29/20, 09/29/20   OSA (obstructive sleep apnea) 09/03/2020   Other specified hypothyroidism    PAF (paroxysmal atrial  fibrillation) (HCC)    s/p pulmonary vein isolation by cryoablation 10/04/15 Dr. Rudolpho Sevin in [facility]   PEA (Pulseless electrical activity) [facility])    ~ 01/13/21 at [facility] during admission DKA, volume overload, possible CAP, hypoxia s/p ACLS with ROSC ~ 10-15   Pneumonia    Primary insomnia    Rheumatoid arthritis (HCC)    Stroke (HCC)    Thyroid disease    Type 1 diabetes mellitus (HCC)    Vitamin D deficiency     Past Surgical History:  Procedure Laterality Date   ABDOMINAL HYSTERECTOMY     Partial- Still has both ovaries   CARDIOVASCULAR STRESS TEST  08/13/2014   Nuclear; Normal   CARPAL TUNNEL  RELEASE     CATARACT EXTRACTION, BILATERAL     CESAREAN SECTION     CHOLECYSTECTOMY     CORONARY ANGIOPLASTY WITH STENT PLACEMENT     3 blockages/ 1 stent   CORONARY PRESSURE/FFR STUDY N/A 09/07/2022   Procedure: INTRAVASCULAR PRESSURE WIRE/FFR STUDY;  Surgeon: Swaziland, Peter M, MD;  Location: [facility] INVASIVE CV LAB;  Service: Cardiovascular;  Laterality: N/A;   CORONARY STENT INTERVENTION N/A 09/29/2020   Procedure: CORONARY STENT INTERVENTION;  Surgeon: Tonny Bollman, MD;  Location: [facility] INVASIVE CV LAB;  Service: Cardiovascular;  Laterality: N/A;  CFX   CORONARY STENT INTERVENTION N/A 09/07/2022   Procedure: CORONARY STENT INTERVENTION;  Surgeon: Swaziland, Peter M, MD;  Location: [facility] INVASIVE CV LAB;  Service: Cardiovascular;  Laterality: N/A;   EYE SURGERY     LEFT HEART CATH AND CORONARY ANGIOGRAPHY N/A 09/29/2020   Procedure: LEFT HEART CATH AND CORONARY ANGIOGRAPHY;  Surgeon: Tonny Bollman, MD;  Location: [facility] INVASIVE CV LAB;  Service: Cardiovascular;  Laterality: N/A;   LEFT HEART CATH AND CORONARY ANGIOGRAPHY N/A 09/07/2022   Procedure: LEFT HEART CATH AND CORONARY ANGIOGRAPHY;  Surgeon: Swaziland, Peter M, MD;  Location: [facility] INVASIVE CV LAB;  Service: Cardiovascular;  Laterality: N/A;   MITRAL VALVE REPAIR N/A 05/18/2021   Procedure: MITRAL VALVE REPAIR;  Surgeon: Orbie Pyo, MD;  Location: [facility] INVASIVE CV LAB;  Service: Cardiovascular;  Laterality: N/A;   RIGHT HEART CATH N/A 04/27/2021   Procedure: RIGHT HEART CATH;  Surgeon: Dolores Patty, MD;  Location: [facility] INVASIVE CV LAB;  Service: Cardiovascular;  Laterality: N/A;   RIGHT/LEFT HEART CATH AND CORONARY ANGIOGRAPHY N/A 01/07/2017   Procedure: Right/Left Heart Cath and Coronary Angiography;  Surgeon: Laurey Morale, MD;  Location: [facility] INVASIVE CV LAB;  Service: Cardiovascular;  Laterality: N/A;   ROTATOR CUFF REPAIR     TEE WITHOUT CARDIOVERSION N/A 04/28/2021   Procedure: TRANSESOPHAGEAL ECHOCARDIOGRAM (TEE);  Surgeon: Dolores Patty, MD;  Location: [facility] ENDOSCOPY;  Service: Cardiovascular;  Laterality: N/A;   TEE WITHOUT CARDIOVERSION N/A 05/18/2021   Procedure: TRANSESOPHAGEAL ECHOCARDIOGRAM (TEE);  Surgeon: Orbie Pyo, MD;  Location: [facility] INVASIVE CV LAB;  Service: Cardiovascular;  Laterality: N/A;   TOE AMPUTATION Left    US ECHOCARDIOGRAPHY  05/2017   Normal    Family Hx:  Family History  Problem Relation Age of Onset   Breast cancer Mother 35       contralateral breast ca dx 8   Coronary artery disease Mother    Hypertension Mother    Hyperlipidemia Mother    Diabetes type II Mother    Lung cancer Mother 32   Diabetes Father    Hypertension Father    Mental illness Sister    Bipolar disorder Other  Depression Other    Schizophrenia Other    Cancer Other        MGF's sisters x2; unknown type    Social History:  reports that she quit smoking about 40 years ago. Her smoking use included cigarettes. She started smoking about 41 years ago. She has been exposed to tobacco smoke. She has never used smokeless tobacco. She reports that she does not drink alcohol and does not use drugs.  Allergies:  Allergies  Allergen Reactions   Naproxen Hives and Rash   Shellfish-Derived Products Hives, Swelling and Other (See Comments)    Angioedema    Strawberry (Diagnostic) Hives   Celecoxib Other (See Comments)    Stomach pain   Glucosamine Hives, Swelling and Other (See Comments)    Angioedema    Iron     IV iron transfusion - hives - Feb 2022 hospitalization   Metoclopramide Other (See Comments)    Facial twitching and stuttering   Metolazone Other (See Comments)    Acute renal failure   Statins Other (See Comments)    Muscle pain    Medications: Prior to Admission medications   Medication Sig Start Date End Date Taking? Authorizing Provider  allopurinol (ZYLOPRIM) 300 MG tablet Take 1 tablet (300 mg total) by mouth daily. 07/05/22  Yes Cox, Fritzi Mandes, MD  aspirin EC 81 MG tablet Take 1  tablet (81 mg total) by mouth daily. Swallow whole. 09/09/22  Yes Cyndi Bender, NP  busPIRone (BUSPAR) 5 MG tablet Take 1 tablet (5 mg total) by mouth 3 (three) times daily. 10/09/22  Yes Cox, Kirsten, MD  carvedilol (COREG) 12.5 MG tablet TAKE ONE TABLET BY MOUTH TWICE DAILY Patient taking differently: Take 12.5 mg by mouth 2 (two) times daily with a meal. 01/09/22  Yes Milford, Nathalie, FNP  cephALEXin (KEFLEX) 250 MG capsule Take 250 mg by mouth at bedtime. 11/30/22  Yes [provider]  clopidogrel (PLAVIX) 75 MG tablet Take 1 tablet (75 mg total) by mouth daily. 07/05/22  Yes Cox, Kirsten, MD  CRANBERRY PO Take 2 tablets by mouth daily.   Yes [provider]  estradiol (ESTRACE) 0.1 MG/GM vaginal cream Place 1 Applicatorful vaginally every other day. 11/27/22  Yes [provider]  Evolocumab (REPATHA SURECLICK) 140 MG/ML SOAJ Inject 1 Dose into the skin every 14 (fourteen) days. 01/17/22  Yes Hilty, Lisette Abu, MD  ezetimibe (ZETIA) 10 MG tablet Take 1 tablet (10 mg total) by mouth daily. 11/05/22  Yes Cox, Kirsten, MD  Furosemide (FUROSCIX [place]) Inject into the skin.   Yes [provider]  hydrALAZINE (APRESOLINE) 25 MG tablet Take 1 tablet (25 mg total) by mouth 3 (three) times daily. 09/21/22  Yes Milford, Anderson Malta, FNP  HYDROcodone-acetaminophen (NORCO) 7.5-325 MG tablet TAKE ONE TABLET BY MOUTH THREE TIMES DAILY AS NEEDED Patient taking differently: Take 1 tablet by mouth 3 (three) times daily as needed for moderate pain. 11/28/22  Yes Cox, Kirsten, MD  insulin aspart (NOVOLOG FLEXPEN) 100 UNIT/ML FlexPen 2-10 U before meals based on SSI. 10/09/22  Yes Cox, Kirsten, MD  isosorbide mononitrate (IMDUR) 30 MG 24 hr tablet Take 1 tablet (30 mg total) by mouth daily. 07/05/22  Yes Cox, Kirsten, MD  latanoprost (XALATAN) 0.005 % ophthalmic solution Place 1 drop into both eyes at bedtime.   Yes [provider]  LEVEMIR FLEXPEN 100 UNIT/ML FlexPen Inject 50 units  into THE SKIN daily Patient taking differently: Inject 50 Units into the skin daily. 07/30/22  Yes Cox, Kirsten, MD  linaclotide [facility]) 145 MCG CAPS capsule Take 1 capsule (145 mcg total) by mouth daily before breakfast. 08/31/22  Yes Cox, Kirsten, MD  LORazepam (ATIVAN) 0.5 MG tablet TAKE ONE TABLET BY MOUTH ONCE DAILY AS NEEDED FOR ANXIETY Patient taking differently: Take 0.5 mg by mouth every 6 (six) hours as needed for anxiety. 10/21/22  Yes Cox, Kirsten, MD  magnesium oxide (MAG-OX) 400 MG tablet Take 2 tablets (800 mg total) by mouth daily. Take 2 (400 mg) daily=800 mg 04/09/18  Yes Graciella Freer, PA-C  nitroGLYCERIN (NITROSTAT) 0.4 MG SL tablet Dissolve 1 tab under tongue as needed for chest pain. May repeat every 5 minutes x 2 doses. If no relief call 9-1-1. Please call [phone] to schedule an appointment for future refills. Thank you. 07/13/22  Yes Janetta Hora, PA-C  Omega-3 Fatty Acids (FISH OIL PO) Take 1 capsule by mouth daily.   Yes [provider]  OXYGEN Inhale 2 L into the lungs at bedtime.   Yes [provider]  pantoprazole (PROTONIX) 40 MG tablet Take 1 tablet (40 mg total) by mouth daily. 07/05/22  Yes Cox, Kirsten, MD  potassium chloride SA (KLOR-CON M) 20 MEQ tablet TAKE ONE TABLET BY MOUTH ONCE daily 07/06/22  Yes Cox, Kirsten, MD  Probiotic CAPS Take 1 capsule by mouth daily.   Yes [provider]  promethazine (PHENERGAN) 25 MG tablet Take 1 tablet (25 mg total) by mouth daily as needed. Patient taking differently: Take 25 mg by mouth daily as needed for nausea or vomiting. 10/22/22  Yes Cox, Kirsten, MD  sulfamethoxazole-trimethoprim (BACTRIM DS) 800-160 MG tablet Take 1 tablet by mouth 2 (two) times daily. 11/30/22  Yes [provider]  torsemide (DEMADEX) 20 MG tablet TAKE 4 TABLETS BY MOUTH ONCE DAILY Patient taking differently: Take 80 mg by mouth daily. 04/09/22  Yes Bensimhon, Bevelyn Buckles, MD  Continuous Blood Gluc  Sensor (FREESTYLE LIBRE 2 SENSOR) MISC USE TO check blood glucose AS DIRECTED AND CHANGE sensor every 14 DAYS 01/11/22   [provider]  EPINEPHrine 0.3 mg/0.3 mL IJ SOAJ injection Use as directed for life-threatening allergic reaction. 06/24/18   Marcelyn Bruins, MD  Insulin Pen Needle (BD PEN NEEDLE NANO 2ND GEN) 32G X 4 MM MISC Inject 1 each into the skin in the morning, at noon, in the evening, and at bedtime. 08/30/21   Blane Ohara, MD    I have reviewed the patient's current medications.  Labs: Renal Panel: Recent Labs  Lab 12/06/22 1045 12/07/22 0253 12/08/22 0159  NA 135 135 136  K 4.1 4.0 4.1  CL 99 100 102  CO2 23 25 21*  GLUCOSE 213* 156* 184*  BUN 65* 65* 64*  CREATININE 2.73* 2.74* 2.89*  CALCIUM 8.8* 8.6* 8.4*     CBC:    Latest Ref Rng & Units 12/08/2022    1:59 AM 12/07/2022    2:53 AM 12/06/2022   10:45 AM  CBC  WBC 4.0 - 10.5 K/uL 17.2  11.8  17.3   Hemoglobin 12.0 - 15.0 g/dL 8.9  9.2  9.3   Hematocrit 36.0 - 46.0 % 28.2  29.5  29.7   Platelets 150 - 400 K/uL 238  221  235      Anemia Panel:  Recent Labs    03/29/22 0955 04/25/22 0813 07/19/22 1529 08/07/22 1103 10/02/22 1043 10/02/22 1044 10/30/22 1053 12/04/22 1133 12/06/22 1045 12/07/22 0253 12/08/22 0159  HGB  --    < >  --    < >  --  9.6* 9.7* 9.7* 9.3* 9.2* 8.9*  MCV  --    < >  --    < >  --  89.0 87.1 87.8 84.9 83.8 84.9  VITAMINB12 596  --  345  --  473  --   --   --   --   --   --   FOLATE 9.4  --  9.7  --   --  10.6  --   --   --   --   --   FERRITIN 141  --  108  --  93  --   --   --   --   --   --   TIBC 263  --  262  --  279  --   --   --   --   --   --   IRON 52  --  35  --  34  --   --   --   --   --   --    < > = values in this interval not displayed.    Recent Labs  Lab 12/06/22 1045  AST 17  ALT 18  ALKPHOS 139*  BILITOT 0.5  PROT 7.3  ALBUMIN 3.2*    Lab Results  Component Value Date   HGBA1C 7.4 (H) 08/31/2022    ROS:  Pertinent  items noted in HPI and remainder of comprehensive ROS otherwise negative.  Physical Exam: Vitals:   12/08/22 0722 12/08/22 0746  BP: 133/61   Pulse:    Resp:  16  Temp: 98.4 F (36.9 C) 98.5 F (36.9 C)  SpO2: 98%      General exam: Mildly distressed because of chest pain and shortness of breath. Respiratory system: Clear to auscultation. Respiratory effort normal. No wheezing or crackle Cardiovascular system: S1 & S2 heard, RRR.  Trace pedal edema. Gastrointestinal system: Abdomen is nondistended, soft and nontender. Normal bowel sounds heard. Central nervous system: Alert awake and following commands extremities: Trace pedal edema, no cyanosis Skin: No rashes, lesions or ulcers Psychiatry: Judgement and insight appear normal. Mood & affect appropriate.   Assessment/Plan:  # Acute kidney injury on CKD IIIb-IV, b/l cr 1.8-2.3: Likely hemodynamically mediated due to combination of diuretics use, Bactrim and NSTEMI.  Urinalysis bland.  She is nonoliguric. She is now going to cardiac cath urgently because of NSTEMI and ongoing chest pain.  I agree with holding diuretics this morning.  Because of history of CHF, I will try to avoid IV fluid.   I have discussed with the patient and her daughter about the risk of worsening kidney function after contrast exposure which may lead to the need of dialysis.  They understand the risk.  I think the benefit of cardiac cath outweighs far more than the risk.  Recommend to use lowest possible contrast load. Agreed with discontinuation of Bactrim. Strict ins and out and daily lab monitoring.  # NSTEMI with ongoing chest pain: Cardiac cath this morning and continue current cardiac medications.  Cardiology is managing.  # Chronic diastolic CHF: Holding diuretics this a.m. because of the procedure.  She is on torsemide 80 mg at home.  # Anemia of CKD: I will check iron level.  She gets erythropoietin injection with her hematologist.  # Urinary tract  infection: It seems like she was started on Bactrim as a prophylaxis because of frequent UTI.  Discontinue Bactrim and switching to Keflex.  Discussed with the patient, family members and the primary  team. Thank you for the consult, we will continue to follow.  Crystal Ellwood Jaynie Collins 12/08/2022, 9:35 AM  [employer].

## 2022-12-08 NOTE — Progress Notes (Signed)
CPAP deferred @ this time due to pt chest pain and 100% O2 via NRBM required for adequate Sats. Resp even and unlabored. Rapid Response called MD arrived @bedside . Will continue to monitor.

## 2022-12-08 NOTE — Progress Notes (Signed)
Pt left 6E on 6L [place]. Pt received back to floor on NRB. Rapid response called to evaluate if pt needed to be transferred to [facility] but MD felt she was not at ICU level. Pt was sating at 94-97% on HFNC however felt she couldn't catch her breath and transitioned back to NRB on 14L. Respiratory made aware of pt situation and said to contact them if needed. Will continue to closely monitor pt.

## 2022-12-08 NOTE — H&P (View-Only) (Signed)
Rounding Note    Patient Name: Dana [name] Date of Encounter: 12/08/2022  [facility] Cardiologist: Arvilla Meres, MD   Subjective   Worsening CP over night with new NSTEMI. Loaded with Brilinta. Placed on nitro drip. Worsening ST changes. Worsening Kidney function. Worsening anemia.   Chest pain is bearable on the nitro drip.  Kidneys are worse; a second troponin is ordered.  Inpatient Medications    Scheduled Meds:  sodium chloride   Intravenous Once   allopurinol  300 mg Oral Daily   aspirin EC  81 mg Oral Daily   busPIRone  5 mg Oral TID   carvedilol  12.5 mg Oral BID   [START ON 12/12/2022] cephALEXin  250 mg Oral QHS   cephALEXin  500 mg Oral Q12H   ezetimibe  10 mg Oral Daily   furosemide  40 mg Intravenous Once   insulin aspart  0-15 Units Subcutaneous TID WC   insulin aspart  0-5 Units Subcutaneous QHS   insulin detemir  30 Units Subcutaneous Daily   isosorbide mononitrate  60 mg Oral Daily    morphine injection  1 mg Intravenous Once   pantoprazole  40 mg Oral Daily   ticagrelor  90 mg Oral BID   Continuous Infusions:  heparin 1,100 Units/hr (12/08/22 0039)   nitroGLYCERIN 110 mcg/min (12/08/22 0723)   PRN Meds: acetaminophen, HYDROcodone-acetaminophen, LORazepam, nitroGLYCERIN, ondansetron (ZOFRAN) IV   Vital Signs    Vitals:   12/08/22 0149 12/08/22 0329 12/08/22 0402 12/08/22 0538  BP:  (!) 127/51 (!) 133/51   Pulse: 80 82    Resp:  19 18   Temp:  98.4 F (36.9 C) 98.4 F (36.9 C)   TempSrc:  Oral Oral   SpO2: 95% 95% 99% 96%  Weight:    107.4 kg  Height:        Intake/Output Summary (Last 24 hours) at 12/08/2022 0742 Last data filed at 12/08/2022 0513 Gross per 24 hour  Intake 630 ml  Output --  Net 630 ml      12/08/2022    5:38 AM 12/07/2022    5:00 AM 12/06/2022    6:09 PM  Last 3 Weights  Weight (lbs) 236 lb 12.4 oz 235 lb 14.3 oz 234 lb 9.6 oz  Weight (kg) 107.4 kg 107 kg 106.414 kg      Telemetry    SR  with short run of SVT - Personally Reviewed  ECG    No new tracing - Personally Reviewed  Physical Exam   GEN:  Moderate distress, ill appearing Neck: No JVD Cardiac: RRR, systolic murmur; no, rubs, or gallops.  Respiratory: Clear to auscultation bilaterally. Slight tachypnea GI: Soft, nontender, non-distended  MS: No edema; No deformity. +2 R femoral pulse Neuro:  Nonfocal  Psych: Normal affect   Labs    High Sensitivity Troponin:   Recent Labs  Lab 12/06/22 1304 12/06/22 1948 12/07/22 0620 12/07/22 2258 12/08/22 0159  TROPONINIHS 9 9 8 10  628*     Chemistry Recent Labs  Lab 12/06/22 1045 12/07/22 0253 12/08/22 0159  NA 135 135 136  K 4.1 4.0 4.1  CL 99 100 102  CO2 23 25 21*  GLUCOSE 213* 156* 184*  BUN 65* 65* 64*  CREATININE 2.73* 2.74* 2.89*  CALCIUM 8.8* 8.6* 8.4*  PROT 7.3  --   --   ALBUMIN 3.2*  --   --   AST 17  --   --   ALT 18  --   --  ALKPHOS 139*  --   --   BILITOT 0.5  --   --   GFRNONAA 19* 19* 18*  ANIONGAP 13 10 13     Lipids No results for input(s): "CHOL", "TRIG", "HDL", "LABVLDL", "LDLCALC", "CHOLHDL" in the last 168 hours.  Hematology Recent Labs  Lab 12/06/22 1045 12/07/22 0253 12/08/22 0159  WBC 17.3* 11.8* 17.2*  RBC 3.50* 3.52* 3.32*  HGB 9.3* 9.2* 8.9*  HCT 29.7* 29.5* 28.2*  MCV 84.9 83.8 84.9  MCH 26.6 26.1 26.8  MCHC 31.3 31.2 31.6  RDW 15.4 15.4 15.2  PLT 235 221 238   Thyroid No results for input(s): "TSH", "FREET4" in the last 168 hours.  BNP Recent Labs  Lab 12/06/22 1814 12/08/22 0159  BNP 143.5* 149.2*    DDimer No results for input(s): "DDIMER" in the last 168 hours.   Radiology    DG CHEST PORT 1 VIEW  Result Date: 12/08/2022 CLINICAL DATA:  Dyspnea. EXAM: PORTABLE CHEST 1 VIEW COMPARISON:  12/06/2022 FINDINGS: The cardio pericardial silhouette is enlarged. Diffuse interstitial and basilar airspace disease is mildly progressive in the interval. Mitral clips and CardioMEMS pulmonary arterial  device again noted Bones are diffusely demineralized. Telemetry leads overlie the chest. IMPRESSION: Interval progression of diffuse interstitial and basilar airspace disease, likely edema. Electronically Signed   By: Kennith Center M.D.   On: 12/08/2022 07:25   DG Chest Portable 1 View  Result Date: 12/06/2022 CLINICAL DATA:  Left chest pain radiating to the left arm EXAM: PORTABLE CHEST 1 VIEW COMPARISON:  09/07/2022 FINDINGS: Mitral clips are obscured by overlying ECG lead. Coronary atherosclerosis noted. A small device projecting over the left lower lobe pulmonary artery is compatible with a CardioMEMS type pulmonary arterial pressure monitor. The patient is rotated to the right on today's radiograph, reducing diagnostic sensitivity and specificity. Moderate cardiomegaly. Mild cephalization of blood flow, nonspecific on semi erect positioning, but pulmonary venous hypertension is not excluded. No overt edema. No blunting of the lateral costophrenic angles. Chronic deformity of the left proximal humerus likely from healed fracture. IMPRESSION: 1. Moderate cardiomegaly with mild cephalization of blood flow, nonspecific on semi erect positioning, but pulmonary venous hypertension is not excluded. No overt edema. 2. Mitraclips are obscured by overlying ECG lead. Pulmonary arterial pressure monitor noted. 3. Coronary atherosclerosis. 4. Chronic deformity of the left proximal humerus likely from healed fracture. Electronically Signed   By: Gaylyn Rong M.D.   On: 12/06/2022 11:37    Cardiac Studies   Cardiac Studies & Procedures   CARDIAC CATHETERIZATION  CARDIAC CATHETERIZATION 09/07/2022  Narrative   Mid LAD to Dist LAD lesion is 60% stenosed.   Prox RCA lesion is 60% stenosed.   1st Mrg-1 lesion is 30% stenosed.   Mid Cx-2 lesion is 100% stenosed.   1st Mrg-2 lesion is 60% stenosed.   Prox LAD to Mid LAD lesion is 90% stenosed.   A drug-eluting stent was successfully placed using a SYNERGY  XD 2.75X28.   Post intervention, there is a 0% residual stenosis.   Mid LAD lesion is 40% stenosed.   Non-stenotic Mid Cx-1 lesion was previously treated.   Non-stenotic Dist RCA lesion was previously treated.   Post intervention, there is a 0% residual stenosis.   LV end diastolic pressure is mildly elevated.  2 vessel obstructive CAD. New 90% stenosis in the proximal LAD with RFR 0.48. The stent in the LCx is occluded after a large first OM Patent stent in the distal RCA Mildly elevated  LVEDP 21 mm Hg Successful PCI of the proximal LAD with DES x 1 overlapping with prior stent.  Plan: observe overnight. Anticipate DC in am. DAPT indefinitely with ASA/Plavix.  Will treat LCx occlusion medically.  Findings Coronary Findings Diagnostic  Dominance: Right  Left Main No significant disease.  Left Anterior Descending The LAD is diffusely diseased. The proximal vessel has nonobstructive disease. The mid vessel stent is patent. The mid/distal vessel has 60% stenosis beyond the stent. The distal vessel is diffusely diseased. Prox LAD to Mid LAD lesion is 90% stenosed. The lesion is moderately calcified. Pressure wire/FFR was performed on the lesion. RFR 0.48 Mid LAD lesion is 40% stenosed. The lesion was previously treated . Mid LAD to Dist LAD lesion is 60% stenosed.  Left Circumflex Large OM1.  30% mid LCx stenosis after OM1.  Moderate OM2 with 30-40% proximal stenosis.  30% distal LCx stenosis. Non-stenotic Mid Cx-1 lesion was previously treated. The lesion is moderately calcified. Mid Cx-2 lesion is 100% stenosed.  First Obtuse Marginal Branch 1st Mrg-1 lesion is 30% stenosed. The lesion was previously treated . 1st Mrg-2 lesion is 60% stenosed.  Third Obtuse Marginal Branch Collaterals 3rd Mrg filled by collaterals from RPAV.  Right Coronary Artery There is moderate diffuse disease throughout the vessel. Serial 30-40% stenoses in the proximal RCA.  Long stented segment in the  PDA was patent. Prox RCA lesion is 60% stenosed. The lesion is calcified. Non-stenotic Dist RCA lesion was previously treated.  Intervention  Prox LAD to Mid LAD lesion Stent (Also treats lesions: Mid LAD) CATH VISTA GUIDE 6FR XBLAD3.5 guide catheter was inserted. Lesion crossed with guidewire using a WIRE ASAHI PROWATER 180CM. Pre-stent angioplasty was performed using a BALLN SCOREFLEX 2.50X10. A drug-eluting stent was successfully placed using a SYNERGY XD 2.75X28. Stent strut is well apposed. Stent overlaps previously placed stent. Post-stent angioplasty was performed using a BALL SAPPHIRE NC24 [id number]. Maximum pressure:  20 atm. Post-Intervention Lesion Assessment The intervention was successful. Pre-interventional TIMI flow is 3. Post-intervention TIMI flow is 3. No complications occurred at this lesion. There is a 0% residual stenosis post intervention.  Mid LAD lesion Stent (Also treats lesions: Prox LAD to Mid LAD) See details in Prox LAD to Mid LAD lesion. Post-Intervention Lesion Assessment The intervention was successful. Pre-interventional TIMI flow is 3. Post-intervention TIMI flow is 3. No complications occurred at this lesion. There is a 0% residual stenosis post intervention.   CARDIAC CATHETERIZATION  CARDIAC CATHETERIZATION 05/18/2021  Narrative 1.  Status post transcatheter mitral edge-to-edge repair with placement of 1 XT W clip and 1 NT W clip.  The NT W clip was entangled and had to be deployed in the lateral part of the valve.  No further clip placement could be pursued due to the proximity of both clips.  The final V waves were 52 mmHg with residual moderate to severe mitral regurgitation.  The results were reviewed with Dr. Gala Romney.     ECHOCARDIOGRAM  ECHOCARDIOGRAM COMPLETE 07/18/2022  Narrative ECHOCARDIOGRAM REPORT    Patient Name:   SHAKIERRA STRIKE Date of Exam: 07/18/2022 Medical Rec #:  [id number]      Height:       64.0 in Accession #:    [id number]      Weight:       234.0 lb Date of Birth:  March 08, 1961      BSA:          2.091 m Patient Age:    33 years  BP:           122/60 mmHg Patient Gender: F              HR:           70 bpm. Exam Location:  [address]  Procedure: 2D Echo, Cardiac Doppler, Color Doppler and Intracardiac Opacification Agent  Indications:    Z98.890 S/P Mitral valve clip implantation; I34.0 Mitral valve insufficiency  History:        Patient has prior history of Echocardiogram examinations, most recent 09/18/2021. CHF, CAD; Risk Factors:Diabetes, Dyslipidemia and Sleep Apnea. S/P ablation of atrial fibrillation. Chronic pain syndrome. Obesity. PVD.  Mitral Valve: Mitra-Clip valve is present in the mitral position. Procedure Date: 05/18/2021.  Sonographer:    Cathie Beams RCS Referring Phys: Fritzi Mandes COX  IMPRESSIONS   1. Left ventricular ejection fraction, by estimation, is 55 to 60%. The left ventricle has normal function. The left ventricle has no regional wall motion abnormalities. Indeterminate diastolic filling due to E-A fusion. 2. Right ventricular systolic function is normal. The right ventricular size is normal. 3. Left atrial size was mildly dilated. 4. The mitral valve has been repaired/replaced. Moderate mitral valve regurgitation. The mean mitral valve gradient is 7.5 mmHg with average heart rate of 75 bpm. There is a Mitra-Clip present in the mitral position. Procedure Date: 05/18/2021. 5. The aortic valve is grossly normal. Aortic valve regurgitation is not visualized. No aortic stenosis is present. 6. The inferior vena cava is normal in size with greater than 50% respiratory variability, suggesting right atrial pressure of 3 mmHg.  Comparison(s): No significant change from prior study.  Conclusion(s)/Recommendation(s): S/P Mitraclip x2. Study unchanged from prior.  FINDINGS Left Ventricle: Left ventricular ejection fraction, by estimation, is 55 to 60%. The left ventricle has normal  function. The left ventricle has no regional wall motion abnormalities. Definity contrast agent was given IV to delineate the left ventricular endocardial borders. The left ventricular internal cavity size was normal in size. There is no left ventricular hypertrophy. Indeterminate diastolic filling due to E-A fusion.  Right Ventricle: The right ventricular size is normal. No increase in right ventricular wall thickness. Right ventricular systolic function is normal.  Left Atrium: Left atrial size was mildly dilated.  Right Atrium: Right atrial size was normal in size.  Pericardium: There is no evidence of pericardial effusion.  Mitral Valve: The mitral valve has been repaired/replaced. Moderate mitral valve regurgitation. There is a Mitra-Clip present in the mitral position. Procedure Date: 05/18/2021. MV peak gradient, 20.8 mmHg. The mean mitral valve gradient is 7.5 mmHg with average heart rate of 75 bpm.  Tricuspid Valve: The tricuspid valve is normal in structure. Tricuspid valve regurgitation is trivial. No evidence of tricuspid stenosis.  Aortic Valve: The aortic valve is grossly normal. Aortic valve regurgitation is not visualized. No aortic stenosis is present.  Pulmonic Valve: The pulmonic valve was not well visualized. Pulmonic valve regurgitation is trivial. No evidence of pulmonic stenosis.  Aorta: The aortic root, ascending aorta, aortic arch and descending aorta are all structurally normal, with no evidence of dilitation or obstruction.  Venous: The inferior vena cava is normal in size with greater than 50% respiratory variability, suggesting right atrial pressure of 3 mmHg.  IAS/Shunts: The atrial septum is grossly normal.   LEFT VENTRICLE PLAX 2D LVIDd:         5.10 cm LVIDs:         3.30 cm LV PW:  0.80 cm LV IVS:        0.80 cm LVOT diam:     1.90 cm LV SV:         72 LV SV Index:   34 LVOT Area:     2.84 cm   RIGHT VENTRICLE TAPSE (M-mode): 2.0  cm  LEFT ATRIUM             Index        RIGHT ATRIUM           Index LA diam:        4.00 cm 1.91 cm/m   RA Area:     13.30 cm LA Vol (A2C):   59.5 ml 28.45 ml/m  RA Volume:   29.40 ml  14.06 ml/m LA Vol (A4C):   76.2 ml 36.44 ml/m LA Biplane Vol: 68.2 ml 32.61 ml/m AORTIC VALVE LVOT Vmax:   102.00 cm/s LVOT Vmean:  68.600 cm/s LVOT VTI:    0.253 m  AORTA Ao Root diam: 3.00 cm Ao Asc diam:  3.10 cm  MITRAL VALVE MV Area (PHT): 3.54 cm     SHUNTS MV Area VTI:   1.30 cm     Systemic VTI:  0.25 m MV Peak grad:  20.8 mmHg    Systemic Diam: 1.90 cm MV Mean grad:  7.5 mmHg MV Vmax:       2.28 m/s MV Vmean:      125.5 cm/s MV Decel Time: 214 msec MR Peak grad: 116.2 mmHg MR Mean grad: 75.0 mmHg MR Vmax:      539.00 cm/s MR Vmean:     408.0 cm/s MV E velocity: 221.00 cm/s MV A velocity: 114.00 cm/s MV E/A ratio:  1.94  Jodelle Red MD Electronically signed by Jodelle Red MD Signature Date/Time: 07/18/2022/4:46:03 PM    Final   TEE  ECHO TEE 05/18/2021  Narrative TRANSESOPHOGEAL ECHO REPORT    Patient Name:   CHAMPALE SOLARI Date of Exam: 05/18/2021 Medical Rec #:  [id number]      Height:       64.5 in Accession #:    [id number]     Weight:       231.5 lb Date of Birth:  14-Mar-1961      BSA:          2.093 m Patient Age:    60 years       BP:           105/41 mmHg Patient Gender: F              HR:           65 bpm. Exam Location:  Inpatient  Procedure: 3D Echo, Cardiac Doppler, Color Doppler and Transesophageal Echo  Indications:     Severe mitral regurgitation [[id number]]  History:         Patient has prior history of Echocardiogram examinations, most recent 05/02/2021. CHF, Previous Myocardial Infarction and CAD, Arrythmias:Atrial Fibrillation, Signs/Symptoms:Dyspnea; Risk Factors:Hypertension, Dyslipidemia, Diabetes and Sleep Apnea. Chronic kidney disease.  Mitral Valve: Mitral clip XT W, NT W valve is present in the mitral position.  Procedure Date: 05/18/2021.  Sonographer:     Leta Jungling RDCS Referring Phys:  [id number] Orbie Pyo Diagnosing Phys: Thurmon Fair MD  PROCEDURE: After discussion of the risks and benefits of a TEE, an informed consent was obtained from the patient. The patient was intubated. The transesophogeal probe was passed without difficulty through the esophogus of the patient. Imaged were obtained  with the patient in a supine position. Sedation performed by different physician. The patient was monitored while under deep sedation. Anesthestetic sedation was provided intravenously by Anesthesiology: 150mg  of Propofol. Image quality was good. The patient developed no complications during the procedure.  TEE with live 3D and 3D postprocessing was performed throughout all stages of the procedure, including transseptal puncture, device deployment, assessment of final results and evaluation for complications  PREPROCEDURE FINDINGS:  At baseline, left ventricular systolic function is normal. Estimated LVEF is 65%. There is hypokinesis of the basal inferolateral wall. There is severe mitral insuficiency with a central jet. There is malcoaptation of the leaflets, primarily due to tethering of the posterior leaflet. Baseline valve area by planimetry 5.7 cm sq. Mean gradient 3 mm Hg at 60 bpm. ERO area by 3D PISA was 0.7 cm sq. Regurgitant volume 132 ml, regurgitant fraction 65%. There is bilateral blunting of pulmonary vein systolic flow. There is a physiological amount of pericardial fluid.  POSTPROCEDURE FINDINGS:  After deployment of the initial MitraClip XTW, the device is well seated with excellent tissue bridge, but there is residual moderate mitral insufficiency lateral to the clip.  A second device, Mitraclip NTW was advanced lateral to the original one. Difficulty was encountered in positioning of the second clip and it became apparent that the anterior leaflet was attached to the second clip,  pulling it towards the posterior arm of the clip. Despite extensive efforts, the device could not be freed from the anterior leaflet, nor could the posterior leaflet be grasped. The device was deployed attached only to the anterior leaflet. Left ventricular systolic function remained normal. Estimated LVEF is 65%. There is unchanged hypokinesis of the basal inferolateral wall. Both clips appear stable in their position. There is moderate to severe mitral insuficiency with major regurgitant jets between the two clips and lateral to the second clip. There is a small iatrogenic secundum atrial septal defect with exclusively left-to-right shunt. With the caveats related to regurgitation evaluation using the continuity equation, regurgitant volume was 68 ml, regurgitant fraction 45%, calculated ERO area at the end of the procedure 0.5 cm sq. There is an unchanged physiological amount of pericardial fluid.    IMPRESSIONS   1. Left ventricular ejection fraction, by estimation, is 65%%. The left ventricle has normal function. The left ventricle demonstrates regional wall motion abnormalities (see scoring diagram/findings for description). Left ventricular diastolic parameters are consistent with Grade II diastolic dysfunction (pseudonormalization). Elevated left atrial pressure. 2. Right ventricular systolic function is normal. The right ventricular size is normal. 3. Left atrial size was mildly dilated. No left atrial/left atrial appendage thrombus was detected. 4. Right atrial size was mildly dilated. 5. The mitral valve is abnormal. Severe mitral valve regurgitation. There is a Mitral clip XT W, NT W present in the mitral position. Procedure Date: 05/18/2021. 6. The aortic valve is tricuspid. Aortic valve regurgitation is not visualized. 7. Evidence of atrial level shunting detected by color flow Doppler. There is a small patent foramen ovale with predominantly left to right shunting across the  atrial septum.  FINDINGS Left Ventricle: Left ventricular ejection fraction, by estimation, is 65%%. The left ventricle has normal function. The left ventricle demonstrates regional wall motion abnormalities. The left ventricular internal cavity size was normal in size. There is no left ventricular hypertrophy. Left ventricular diastolic parameters are consistent with Grade II diastolic dysfunction (pseudonormalization).  Right Ventricle: The right ventricular size is normal. No increase in right ventricular wall thickness. Right ventricular systolic  function is normal.  Left Atrium: Left atrial size was mildly dilated. No left atrial/left atrial appendage thrombus was detected.  Right Atrium: Right atrial size was mildly dilated.  Pericardium: Trivial pericardial effusion is present.  Mitral Valve: The mitral valve is abnormal. Severe mitral valve regurgitation. There is a Mitral clip XT W, NT W present in the mitral position. Procedure Date: 05/18/2021.  Tricuspid Valve: The tricuspid valve is normal in structure. Tricuspid valve regurgitation is trivial.  Aortic Valve: The aortic valve is tricuspid. Aortic valve regurgitation is not visualized.  Pulmonic Valve: The pulmonic valve was normal in structure. Pulmonic valve regurgitation is not visualized.  Aorta: The aortic root, ascending aorta and aortic arch are all structurally normal, with no evidence of dilitation or obstruction.  IAS/Shunts: Evidence of atrial level shunting detected by color flow Doppler. A small patent foramen ovale is detected with predominantly left to right shunting across the atrial septum.   LEFT VENTRICLE PLAX 2D LVOT diam:     2.10 cm LV SV:         67 LV SV Index:   32 LVOT Area:     3.46 cm   AORTIC VALVE LVOT Vmax:   78.40 cm/s LVOT Vmean:  57.600 cm/s LVOT VTI:    0.194 m  MR Peak grad:      111.3 mmHg MR Mean grad:      84.0 mmHg    SHUNTS MR Vmax:           527.50 cm/s  Systemic VTI:   0.19 m MR Vmean:          448.0 cm/s   Systemic Diam: 2.10 cm MR Vena Contracta: 0.52 cm MR PISA:           2.52 cm MR PISA Radius:    0.63 cm  Mihai Croitoru MD Electronically signed by Thurmon Fair MD Signature Date/Time: 05/18/2021/3:05:55 PM    Final   MONITORS  LONG TERM MONITOR (3-14 DAYS) 04/12/2021  Narrative Patch Wear Time:  14 days and 0 hours (2022-08-31T12:08:59-0400 to 2022-09-14T12:09:09-398)  1. Sinus rhythm - min HR of 51 bpm, max HR of 190 bpm, and avg HR of 70 bpm. Predominant 2. 2. One run of nonsustained Ventricular Tachycardia occurred lasting 5 beats with a max rate of 171 bpm (avg 128 bpm). 3. 66 runs of Supraventricular Tachycardia runs occurred, the run with the fastest interval lasting 5 beats with a max rate of 190 bpm, the longest lasting 16.6 secs with an avg rate of 125 bpm.  4. 4. Frequent PACs (11.2%, [id number]) 5. Rare PVCs  Arvilla Meres, MD 3:53 PM            Patient Profile     Dana [name] is a 62 y.o. female with a hx of chronic diastolic HF, cardiomems 02/2018, CAD with hx DES RCA and LAD -2018, 2022 DES mLCX, and PTCA in side branch of OM1 DM, HTN, HLD, PAD, OSA on CPAP, PAF s/p ablation, PAD s/p L great toe and 4th toe amputation, stage IIIb CKD who is being seen for the evaluation and management of chest pain.  Assessment & Plan    NSTEMI CAD With new lateral ST changes; likely related to LCX disease - on nitro drip, Coreg 12.5 mg PO BID - on heparin - on ASA and Brilinta - complicated by worsening AKI and increase creatinine - will uptriage to a higher level of care - recommend nephrology consult  -case was discussed with IV  on call last night and early AM; discussed high risk LHC with IC today  Risks and benefits of cardiac catheterization have been discussed with the patient.  These include bleeding, infection, kidney damage, stroke, heart attack, death, potential need for dialysis.  The patient understands these risks  and is willing to proceed.   Access recommendations: R femoral Procedural considerations GFR of 18; she and family are aware that contrast from percutaneous intervention could lead to HD  AKI on CKD IIIb UTI - Home torsemide held; her home hydralazine has been held for hypotension  Paroxysmal atrial fibrillation - Hx PAF s/p ablation >10 years ago no further not on AC; on BB  Hyperlipidemia - statin intolerant on  Repatha and Zetia.   Chronic diastolic CHF - sees AHF (DB) and is s/p CardiMEMS - table per CardioMEMs monitor.  - diuresis as above  Mitral regurgitation  - no evidence of SLDA; will repeat echocardiogram given worsening clinical status  CRITICAL CARE Performed by: Zamarah Ullmer A Mason Burleigh  Total critical care time: 77 minutes. Critical care time was exclusive of separately billable procedures and treating other patients. Critical care was necessary to treat or prevent imminent or life-threatening deterioration. Critical care was time spent personally by me on the following activities: development of treatment plan with patient and/or surrogate as well as nursing, discussions with consultants, evaluation of patient's response to treatment, examination of patient, obtaining history from patient or surrogate, ordering and performing treatments and interventions, ordering and review of laboratory studies, ordering and review of radiographic studies, pulse oximetry and re-evaluation of patient's condition.    Signed, Riley Lam, MD FASE [facility]  [facility]  12/08/2022 8:21 AM       For questions or updates, please contact [facility] Please consult [email or web address] for contact info under        Signed, Christell Constant, MD  12/08/2022, 7:42 AM

## 2022-12-08 NOTE — Progress Notes (Signed)
Site area- right  Site Prior to Removal- 0   Pressure Applied For-  40 MInutes   Bedrest Beginning at - 1615   Manual- Yes   Patient Status During Pull- Stable    Post Pull Groin Site- 0   Post Pull Instructions Given- Yes   Post Pull Pulses Present- Yes    Dressing Applied- Tegaderm and Gauze Dressing    Comments:  Arrived to 6E23 to pull 6Fr sheath from right femoral artery. Patient was in bed on NRB. ACT was 185, per Dr. Herbie Baltimore okay to remove sheath and extend hold time. Sheath removed hemostasis easily achieved. Patient had slight pain and sweating during removal, V/S remained stable. Site held for 40 minutes with no complications. Site level 0 post pull with R DP present. 6E RN visualized site prior to me leaving bedside and confirmed level 0.

## 2022-12-08 NOTE — Progress Notes (Signed)
PROGRESS NOTE    Dana [name]  WNU:[id number] DOB: [birth year] DOA: 12/06/2022 PCP: Blane Ohara, MD    Brief Narrative:  62 year old with history of coronary artery disease status post multiple stenting, A-fib status post ablation, mitral regurgitation with MitraClip, sleep apnea on CPAP, obesity, cardiac arrest, chronic respiratory failure with hypoxemia, CKD stage IIIb with baseline creatinine about 1.9, anemia, hypertension, hyperlipidemia, gout, GERD, anxiety, depression and diastolic dysfunction presented with midsternal chest pain mostly on the left side transiently improved with nitroglycerin but recurred.  In the emergency room hemodynamically stable.  Creatinine 2.73 from recent baseline of 1.9.  Troponins negative.  EKG nonischemic.  Due to recurrent chest pain, case discussed with cardiology who recommended ischemia evaluation and patient admitted to the hospital.  5/25, overnight with worsening chest pain and shortness of breath.  Taken urgently to cardiac cath and stented LAD.  Developed acute hypoxemia.   Assessment & Plan:   Non-STEMI, Patient with extensive history of coronary artery disease, unstable angina: Overnight with worsening chest pain and elevated troponins. Loaded with Brilinta and on nitro drip.  ST changes noted. Taken to cardiac cath emergently Drug-eluting stent proximal LAD to mid LAD lesion that was 90% stenosed. Patient does have other multiple areas of stenosis. Currently on aspirin, Brilinta.  Followed by cardiology. On nitroglycerin infusion. Patient may additionally need more coronary stents.  Acute respiratory failure with hypoxemia, respiratory distress: Overnight with fluid overload, acute exacerbation of chronic diastolic congestive heart failure. Currently on nonrebreather.  6 L of oxygen.  Given oxygen to keep saturation more than 90%.  Intermittent doses of insulin.  Discontinue IV fluids.  Acute kidney injury with history of chronic kidney  disease stage IIIb: Baseline creatinine about 1.9.  Multifactorial AKI including recent use of Bactrim. Hold antihypertensives as well as torsemide.  UTI: Recently diagnosed at urology office.  Cultures not available.  He started on Bactrim and long-term Keflex for prophylaxis. Reculture of the urine, pending Discontinue Bactrim.  Continue Keflex 500 mg 3 times daily for 5 days then she will go back on her chronic suppressive therapy 250 mg daily at bedtime.  Hyperlipidemia, on Zetia and Repatha Patient's hypertension, Coreg and Imdur was continued.  Holding hydralazine and torsemide. A-fib, sinus rhythm post ablation. Mitral regurgitation post MitraClip. Type 2 diabetes, well-controlled.  Remains on long-acting insulin and sliding scale insulin. Sleep apnea, using CPAP. Gout, on home allopurinol. GERD, on PPI. Anxiety and depression, on BuSpar and Ativan.   DVT prophylaxis: heparin bolus via infusion 1,000 Units Start: 12/08/22 1030   Code Status: Full code Family Communication: Daughter and mother at bedside. Disposition Plan: Status is: Inpatient.  Active treatment of non-STEMI, respiratory distress     Consultants:  Cardiology  Procedures:  None  Antimicrobials:  Bactrim, discontinue Keflex 5 mg twice daily 5/24   Subjective:  Patient seen and examined.  Overnight events noted.  Developed severe chest pain and shortness of breath on nonrebreather overnight.  Taken urgently to cardiac cath today.  On my evaluation before cardiac cath, patient had mild chest soreness and no shortness of breath at rest.  She had episodes of dyspnea after coming back from procedure.  Objective: Vitals:   12/08/22 0746 12/08/22 1045 12/08/22 1124 12/08/22 1306  BP:    (!) 152/59  Pulse:    87  Resp: 16   20  Temp: 98.5 F (36.9 C)     TempSrc: Oral     SpO2:  (!) 88% 98% 98%  Weight:  Height:        Intake/Output Summary (Last 24 hours) at 12/08/2022 1308 Last data filed at  12/08/2022 0735 Gross per 24 hour  Intake 1500.54 ml  Output 650 ml  Net 850.54 ml    Filed Weights   12/06/22 1809 12/07/22 0500 12/08/22 0538  Weight: 106.4 kg 107 kg 107.4 kg    Examination:  General exam: Appears anxious.  In mild distress and tachypneic. Respiratory system: No added sounds.  Poor inspiratory effort. SpO2: 98 % O2 Flow Rate (L/min): 14 L/min  Cardiovascular system: S1 & S2 heard, RRR.  Chronic nonpitting edema. Gastrointestinal system: Abdomen is nondistended, soft and nontender. No organomegaly or masses felt. Normal bowel sounds heard. Central nervous system: Alert and oriented. No focal neurological deficits. Extremities: Symmetric 5 x 5 power. Skin: No rashes, lesions or ulcers Psychiatry: Judgement and insight appear normal.     Data Reviewed: I have personally reviewed following labs and imaging studies  CBC: Recent Labs  Lab 12/04/22 1133 12/06/22 1045 12/07/22 0253 12/08/22 0159  WBC 11.0* 17.3* 11.8* 17.2*  NEUTROABS 7.6 14.9*  --   --   HGB 9.7* 9.3* 9.2* 8.9*  HCT 31.6* 29.7* 29.5* 28.2*  MCV 87.8 84.9 83.8 84.9  PLT 248 235 221 238    Basic Metabolic Panel: Recent Labs  Lab 12/06/22 1045 12/07/22 0253 12/08/22 0159  NA 135 135 136  K 4.1 4.0 4.1  CL 99 100 102  CO2 23 25 21*  GLUCOSE 213* 156* 184*  BUN 65* 65* 64*  CREATININE 2.73* 2.74* 2.89*  CALCIUM 8.8* 8.6* 8.4*    GFR: Estimated Creatinine Clearance: 25.1 mL/min (A) (by C-G formula based on SCr of 2.89 mg/dL (H)). Liver Function Tests: Recent Labs  Lab 12/06/22 1045  AST 17  ALT 18  ALKPHOS 139*  BILITOT 0.5  PROT 7.3  ALBUMIN 3.2*    No results for input(s): "LIPASE", "AMYLASE" in the last 168 hours. No results for input(s): "AMMONIA" in the last 168 hours. Coagulation Profile: No results for input(s): "INR", "PROTIME" in the last 168 hours. Cardiac Enzymes: No results for input(s): "CKTOTAL", "CKMB", "CKMBINDEX", "TROPONINI" in the last 168  hours. BNP (last 3 results) No results for input(s): "PROBNP" in the last 8760 hours. HbA1C: No results for input(s): "HGBA1C" in the last 72 hours. CBG: Recent Labs  Lab 12/07/22 0801 12/07/22 1133 12/07/22 1558 12/07/22 2120 12/08/22 0745  GLUCAP 121* 132* 217* 187* 190*    Lipid Profile: No results for input(s): "CHOL", "HDL", "LDLCALC", "TRIG", "CHOLHDL", "LDLDIRECT" in the last 72 hours. Thyroid Function Tests: No results for input(s): "TSH", "T4TOTAL", "FREET4", "T3FREE", "THYROIDAB" in the last 72 hours. Anemia Panel: No results for input(s): "VITAMINB12", "FOLATE", "FERRITIN", "TIBC", "IRON", "RETICCTPCT" in the last 72 hours. Sepsis Labs: Recent Labs  Lab 12/06/22 1456 12/06/22 1814  LATICACIDVEN 1.4 1.1     No results found for this or any previous visit (from the past 240 hour(s)).       Radiology Studies: DG CHEST PORT 1 VIEW  Result Date: 12/08/2022 CLINICAL DATA:  Dyspnea. EXAM: PORTABLE CHEST 1 VIEW COMPARISON:  12/06/2022 FINDINGS: The cardio pericardial silhouette is enlarged. Diffuse interstitial and basilar airspace disease is mildly progressive in the interval. Mitral clips and CardioMEMS pulmonary arterial device again noted Bones are diffusely demineralized. Telemetry leads overlie the chest. IMPRESSION: Interval progression of diffuse interstitial and basilar airspace disease, likely edema. Electronically Signed   By: Kennith Center M.D.   On: 12/08/2022 07:25  Scheduled Meds:  sodium chloride   Intravenous Once   allopurinol  300 mg Oral Daily   aspirin EC  81 mg Oral Daily   busPIRone  5 mg Oral TID   carvedilol  12.5 mg Oral BID   [START ON 12/12/2022] cephALEXin  250 mg Oral QHS   cephALEXin  500 mg Oral Q12H   ezetimibe  10 mg Oral Daily   heparin  1,000 Units Intravenous Once   insulin aspart  0-15 Units Subcutaneous TID WC   insulin aspart  0-5 Units Subcutaneous QHS   insulin detemir  30 Units Subcutaneous Daily    isosorbide mononitrate  60 mg Oral Daily    morphine injection  1 mg Intravenous Once   pantoprazole  40 mg Oral Daily   sodium chloride flush  3 mL Intravenous Q12H   sodium chloride flush  3 mL Intravenous Q12H   ticagrelor  90 mg Oral BID   Continuous Infusions:  sodium chloride     sodium chloride     heparin 1,100 Units/hr (12/08/22 0039)   nitroGLYCERIN     sodium chloride       LOS: 1 day    Time spent: 35 minutes    Dorcas Carrow, MD [facility] Pager [phone]

## 2022-12-09 ENCOUNTER — Inpatient Hospital Stay (HOSPITAL_COMMUNITY): Payer: PPO

## 2022-12-09 ENCOUNTER — Encounter (HOSPITAL_COMMUNITY): Payer: Self-pay

## 2022-12-09 DIAGNOSIS — Z954 Presence of other heart-valve replacement: Secondary | ICD-10-CM | POA: Diagnosis not present

## 2022-12-09 DIAGNOSIS — I200 Unstable angina: Secondary | ICD-10-CM | POA: Diagnosis not present

## 2022-12-09 DIAGNOSIS — I2510 Atherosclerotic heart disease of native coronary artery without angina pectoris: Secondary | ICD-10-CM | POA: Diagnosis not present

## 2022-12-09 LAB — CBC
HCT: 30.4 % — ABNORMAL LOW (ref 36.0–46.0)
Hemoglobin: 9.7 g/dL — ABNORMAL LOW (ref 12.0–15.0)
MCH: 26.4 pg (ref 26.0–34.0)
MCHC: 31.9 g/dL (ref 30.0–36.0)
MCV: 82.8 fL (ref 80.0–100.0)
Platelets: 242 10*3/uL (ref 150–400)
RBC: 3.67 MIL/uL — ABNORMAL LOW (ref 3.87–5.11)
RDW: 15.5 % (ref 11.5–15.5)
WBC: 15.1 10*3/uL — ABNORMAL HIGH (ref 4.0–10.5)
nRBC: 0 % (ref 0.0–0.2)

## 2022-12-09 LAB — ECHOCARDIOGRAM COMPLETE
AR max vel: 2.47 cm2
AV Area VTI: 2.83 cm2
AV Area mean vel: 2.45 cm2
AV Mean grad: 2.5 mmHg
AV Peak grad: 4.8 mmHg
Ao pk vel: 1.09 m/s
Area-P 1/2: 2.35 cm2
Height: 64 in
MV VTI: 1.13 cm2
S' Lateral: 3.4 cm
Weight: 3704 oz

## 2022-12-09 LAB — BASIC METABOLIC PANEL
Anion gap: 12 (ref 5–15)
BUN: 56 mg/dL — ABNORMAL HIGH (ref 6–20)
CO2: 20 mmol/L — ABNORMAL LOW (ref 22–32)
Calcium: 8.2 mg/dL — ABNORMAL LOW (ref 8.9–10.3)
Chloride: 103 mmol/L (ref 98–111)
Creatinine, Ser: 2.75 mg/dL — ABNORMAL HIGH (ref 0.44–1.00)
GFR, Estimated: 19 mL/min — ABNORMAL LOW (ref >60)
Glucose, Bld: 162 mg/dL — ABNORMAL HIGH (ref 70–99)
Potassium: 3.8 mmol/L (ref 3.5–5.1)
Sodium: 135 mmol/L (ref 135–145)

## 2022-12-09 LAB — TYPE AND SCREEN: Antibody Screen: NEGATIVE

## 2022-12-09 LAB — GLUCOSE, CAPILLARY
Glucose-Capillary: 171 mg/dL — ABNORMAL HIGH (ref 70–99)
Glucose-Capillary: 244 mg/dL — ABNORMAL HIGH (ref 70–99)
Glucose-Capillary: 261 mg/dL — ABNORMAL HIGH (ref 70–99)
Glucose-Capillary: 239 mg/dL — ABNORMAL HIGH (ref 70–99)
Glucose-Capillary: 171 mg/dL — ABNORMAL HIGH (ref 70–99)

## 2022-12-09 LAB — HEPARIN LEVEL (UNFRACTIONATED)
Heparin Unfractionated: 0.21 IU/mL — ABNORMAL LOW (ref 0.30–0.70)
Heparin Unfractionated: 0.33 IU/mL (ref 0.30–0.70)

## 2022-12-09 LAB — BPAM RBC: ISSUE DATE / TIME: 202405110745

## 2022-12-09 MED ORDER — FUROSEMIDE 10 MG/ML IJ SOLN
80.0000 mg | Freq: Once | INTRAMUSCULAR | Status: AC
  Administered 2022-12-09: 80 mg via INTRAVENOUS
  Filled 2022-12-09: qty 8

## 2022-12-09 MED ORDER — SODIUM CHLORIDE 0.9 % IV SOLN
25.0000 mg | Freq: Four times a day (QID) | INTRAVENOUS | Status: DC | PRN
  Administered 2022-12-09 – 2022-12-10 (×2): 25 mg via INTRAVENOUS
  Filled 2022-12-09 (×3): qty 1

## 2022-12-09 MED ORDER — PERFLUTREN LIPID MICROSPHERE
1.0000 mL | INTRAVENOUS | Status: AC | PRN
  Administered 2022-12-09: 2 mL via INTRAVENOUS

## 2022-12-09 MED ORDER — MENTHOL 3 MG MT LOZG
1.0000 | LOZENGE | OROMUCOSAL | Status: DC | PRN
  Filled 2022-12-09: qty 9

## 2022-12-09 MED ORDER — OXYMETAZOLINE HCL 0.05 % NA SOLN
1.0000 | Freq: Two times a day (BID) | NASAL | Status: AC
  Administered 2022-12-09 – 2022-12-11 (×3): 1 via NASAL
  Filled 2022-12-09: qty 30

## 2022-12-09 MED ORDER — HEPARIN BOLUS VIA INFUSION
1100.0000 [IU] | Freq: Once | INTRAVENOUS | Status: AC
  Administered 2022-12-09: 1100 [IU] via INTRAVENOUS
  Filled 2022-12-09: qty 1100

## 2022-12-09 NOTE — Progress Notes (Signed)
Patient refusing CPAP for the night due to having nausea and nasal bleeding.

## 2022-12-09 NOTE — Progress Notes (Signed)
ANTICOAGULATION CONSULT NOTE - Initial Consult  Pharmacy Consult for heparin Indication: chest pain/ACS  Allergies  Allergen Reactions   Naproxen Hives and Rash   Shellfish-Derived Products Hives, Swelling and Other (See Comments)    Angioedema    Strawberry (Diagnostic) Hives   Celecoxib Other (See Comments)    Stomach pain   Glucosamine Hives, Swelling and Other (See Comments)    Angioedema    Iron     IV iron transfusion - hives - Feb 2022 hospitalization   Metoclopramide Other (See Comments)    Facial twitching and stuttering   Metolazone Other (See Comments)    Acute renal failure   Statins Other (See Comments)    Muscle pain    Patient Measurements: Height: 5\' 4"  (162.6 cm) Weight: 105 kg (231 lb 8 oz) IBW/kg (Calculated) : 54.7 Heparin Dosing Weight: 79.8kg  Vital Signs: Temp: 98.6 F (37 C) (05/26 0742) Temp Source: Oral (05/26 0742) BP: 127/47 (05/26 0742) Pulse Rate: 88 (05/26 0700)  Labs: Recent Labs    12/07/22 0253 12/07/22 0620 12/08/22 0159 12/08/22 0802 12/08/22 0931 12/09/22 0618  HGB 9.2*  --  8.9*  --   --  9.7*  HCT 29.5*  --  28.2*  --   --  30.4*  PLT 221  --  238  --   --  242  HEPARINUNFRC  --   --   --  0.26*  --  0.21*  CREATININE 2.74*  --  2.89*  --   --  2.75*  TROPONINIHS  --    < > 628* 2,035* 1,740*  --    < > = values in this interval not displayed.     Estimated Creatinine Clearance: 26 mL/min (A) (by C-G formula based on SCr of 2.75 mg/dL (H)).   Medical History: Past Medical History:  Diagnosis Date   Acquired absence of left great toe (HCC)    Acquired absence of other left toe(s) (HCC)    ACS (acute coronary syndrome) (HCC)    Anemia    Anoxic brain injury (HCC)    Anxiety    CAD (coronary artery disease)    DES mLAD 04/07/15; DES dRCA & DES pPDA 08/30/16; DES mCX & PTCA OM1 09/29/20   Chronic diastolic (congestive) heart failure (HCC)    Chronic pain    Chronic systolic (congestive) heart failure (HCC)     CKD (chronic kidney disease)    Contrast dye induced nephropathy, possible 09/03/2020   COPD (chronic obstructive pulmonary disease) (HCC)    Diabetes (HCC)type 1    Diastolic CHF (HCC)    Dyspnea    Family history of breast cancer 10/23/2021   Gastro-esophageal reflux disease without esophagitis    Hyperlipidemia    Hypertension    Hypertensive heart disease with heart failure (HCC)    Hypoxemia    Idiopathic gout, multiple sites    Leukocytosis 03/29/2022   Localized edema    Low back pain    Mitral regurgitation    Mitral regurgitation    Mixed hyperlipidemia    Mixed incontinence    Morbid (severe) obesity due to excess calories (HCC)    Neuromuscular disorder (HCC)    Neuropathy    Non-ST elevation (NSTEMI) myocardial infarction (HCC)    08/29/20, 09/29/20   OSA (obstructive sleep apnea) 09/03/2020   Other specified hypothyroidism    PAF (paroxysmal atrial fibrillation) (HCC)    s/p pulmonary vein isolation by cryoablation 10/04/15 Dr. Rudolpho Sevin in [facility]  PEA (Pulseless electrical activity) (HCC)    ~ [facility] during admission DKA, volume overload, possible CAP, hypoxia s/p ACLS with ROSC ~ 10-15   Pneumonia    Primary insomnia    Rheumatoid arthritis (HCC)    Stroke (HCC)    Thyroid disease    Type 1 diabetes mellitus (HCC)    Vitamin D deficiency     Medications:  Medications Prior to Admission  Medication Sig Dispense Refill Last Dose   allopurinol (ZYLOPRIM) 300 MG tablet Take 1 tablet (300 mg total) by mouth daily. 90 tablet 1 12/06/2022   aspirin EC 81 MG tablet Take 1 tablet (81 mg total) by mouth daily. Swallow whole. 30 tablet 12 12/06/2022   busPIRone (BUSPAR) 5 MG tablet Take 1 tablet (5 mg total) by mouth 3 (three) times daily. 270 tablet 1 12/06/2022   carvedilol (COREG) 12.5 MG tablet TAKE ONE TABLET BY MOUTH TWICE DAILY (Patient taking differently: Take 12.5 mg by mouth 2 (two) times daily with a meal.) 180 tablet 3 12/06/2022 at 0800    cephALEXin (KEFLEX) 250 MG capsule Take 250 mg by mouth at bedtime.   12/05/2022   clopidogrel (PLAVIX) 75 MG tablet Take 1 tablet (75 mg total) by mouth daily. 90 tablet 1 12/05/2022   CRANBERRY PO Take 2 tablets by mouth daily.   12/06/2022   estradiol (ESTRACE) 0.1 MG/GM vaginal cream Place 1 Applicatorful vaginally every other day.   12/05/2022   Evolocumab (REPATHA SURECLICK) 140 MG/ML SOAJ Inject 1 Dose into the skin every 14 (fourteen) days. 6 mL 1 Past Week   ezetimibe (ZETIA) 10 MG tablet Take 1 tablet (10 mg total) by mouth daily. 90 tablet 1 12/05/2022   Furosemide (FUROSCIX [place]) Inject into the skin.      hydrALAZINE (APRESOLINE) 25 MG tablet Take 1 tablet (25 mg total) by mouth 3 (three) times daily. 90 tablet 8 12/06/2022   HYDROcodone-acetaminophen (NORCO) 7.5-325 MG tablet TAKE ONE TABLET BY MOUTH THREE TIMES DAILY AS NEEDED (Patient taking differently: Take 1 tablet by mouth 3 (three) times daily as needed for moderate pain.) 90 tablet 0 12/06/2022   insulin aspart (NOVOLOG FLEXPEN) 100 UNIT/ML FlexPen 2-10 U before meals based on SSI. 15 mL 3 12/06/2022   isosorbide mononitrate (IMDUR) 30 MG 24 hr tablet Take 1 tablet (30 mg total) by mouth daily. 90 tablet 1 12/06/2022   latanoprost (XALATAN) 0.005 % ophthalmic solution Place 1 drop into both eyes at bedtime.   12/05/2022   LEVEMIR FLEXPEN 100 UNIT/ML FlexPen Inject 50 units into THE SKIN daily (Patient taking differently: Inject 50 Units into the skin daily.) 15 mL 1 12/06/2022   linaclotide (LINZESS) 145 MCG CAPS capsule Take 1 capsule (145 mcg total) by mouth daily before breakfast. 90 capsule 1 12/06/2022   LORazepam (ATIVAN) 0.5 MG tablet TAKE ONE TABLET BY MOUTH ONCE DAILY AS NEEDED FOR ANXIETY (Patient taking differently: Take 0.5 mg by mouth every 6 (six) hours as needed for anxiety.) 30 tablet 2 12/06/2022   magnesium oxide (MAG-OX) 400 MG tablet Take 2 tablets (800 mg total) by mouth daily. Take 2 (400 mg) daily=800 mg 60 tablet 1  12/06/2022   nitroGLYCERIN (NITROSTAT) 0.4 MG SL tablet Dissolve 1 tab under tongue as needed for chest pain. May repeat every 5 minutes x 2 doses. If no relief call 9-1-1. Please call [phone] to schedule an appointment for future refills. Thank you. 50 tablet 0 12/06/2022   Omega-3 Fatty Acids (FISH OIL  PO) Take 1 capsule by mouth daily.   12/06/2022   OXYGEN Inhale 2 L into the lungs at bedtime.   12/06/2022   pantoprazole (PROTONIX) 40 MG tablet Take 1 tablet (40 mg total) by mouth daily. 90 tablet 1 12/06/2022   potassium chloride SA (KLOR-CON M) 20 MEQ tablet TAKE ONE TABLET BY MOUTH ONCE daily 90 tablet 1 12/06/2022   Probiotic CAPS Take 1 capsule by mouth daily.   12/06/2022   promethazine (PHENERGAN) 25 MG tablet Take 1 tablet (25 mg total) by mouth daily as needed. (Patient taking differently: Take 25 mg by mouth daily as needed for nausea or vomiting.) 30 tablet 3 12/06/2022   sulfamethoxazole-trimethoprim (BACTRIM DS) 800-160 MG tablet Take 1 tablet by mouth 2 (two) times daily.   12/06/2022   torsemide (DEMADEX) 20 MG tablet TAKE 4 TABLETS BY MOUTH ONCE DAILY (Patient taking differently: Take 80 mg by mouth daily.) 336 tablet 3 12/06/2022   Continuous Blood Gluc Sensor (FREESTYLE LIBRE 2 SENSOR) MISC USE TO check blood glucose AS DIRECTED AND CHANGE sensor every 14 DAYS      EPINEPHrine 0.3 mg/0.3 mL IJ SOAJ injection Use as directed for life-threatening allergic reaction. 4 Device 3    Insulin Pen Needle (BD PEN NEEDLE NANO 2ND GEN) 32G X 4 MM MISC Inject 1 each into the skin in the morning, at noon, in the evening, and at bedtime. 600 each 3    Scheduled:   sodium chloride   Intravenous Once   allopurinol  300 mg Oral Daily   aspirin EC  81 mg Oral Daily   busPIRone  5 mg Oral TID   carvedilol  12.5 mg Oral BID   [START ON 12/12/2022] cephALEXin  250 mg Oral QHS   cephALEXin  500 mg Oral Q12H   ezetimibe  10 mg Oral Daily   furosemide  80 mg Intravenous Once   insulin aspart  0-15  Units Subcutaneous TID WC   insulin aspart  0-5 Units Subcutaneous QHS   insulin detemir  30 Units Subcutaneous Daily   isosorbide mononitrate  60 mg Oral Daily    morphine injection  1 mg Intravenous Once   pantoprazole  40 mg Oral Daily   sodium chloride flush  3 mL Intravenous Q12H   sodium chloride flush  3 mL Intravenous Q12H   ticagrelor  90 mg Oral BID   Infusions:   sodium chloride     heparin 1,250 Units/hr (12/09/22 0017)   nitroGLYCERIN Stopped (12/08/22 2312)   sodium chloride      Assessment: 61yo female was initially admitted 5/23 for CP with negative troponins; this evening she c/o 7/10 CP that was relieved to 3/10 after NTGx3, then 1h later CP was at 9/10 and pt reports pain radiating down LUE, now started on NTG infusion with plan to load with Brilinta and start UFH; troponin in process now.  On 5/25, patient was taken emergently to cath lab after developing chest pain and shortness of breath and received a stent to the LAD. Plan is now for a staged PCI on the RCA once renal function is stable.  Heparin level is subtherapeutic at 0.21 on 1250 units/hour. CBC is stable. No issues with infusion overnight, no signs/symptoms of bleeding.   Goal of Therapy:  Heparin level 0.3-0.7 units/ml Monitor platelets by anticoagulation protocol: Yes   Plan:  Give heparin 1100 unit bolus x1 Increase heparin infusion to 1400 units/hour Check heparin level in 6-hours Monitor daily heparin levels,  CBC, and signs/symptoms of bleeding  Thank you for involving pharmacy in this patient's care.   Rockwell Alexandria, PharmD PGY1 Pharmacy Resident 12/09/2022 8:41 AM

## 2022-12-09 NOTE — Progress Notes (Signed)
Rounding Note    Patient Name: Dana [name] Date of Encounter: 12/09/2022  [facility] Cardiologist: Arvilla Meres, MD   Subjective   Overnight, placed on BIPAP + 1.8 L Creatinine stable Patient feels much better. Down to just O2 in the AM.  Has DOE.  No CP  Inpatient Medications    Scheduled Meds:  sodium chloride   Intravenous Once   allopurinol  300 mg Oral Daily   aspirin EC  81 mg Oral Daily   busPIRone  5 mg Oral TID   carvedilol  12.5 mg Oral BID   [START ON 12/12/2022] cephALEXin  250 mg Oral QHS   cephALEXin  500 mg Oral Q12H   ezetimibe  10 mg Oral Daily   insulin aspart  0-15 Units Subcutaneous TID WC   insulin aspart  0-5 Units Subcutaneous QHS   insulin detemir  30 Units Subcutaneous Daily   isosorbide mononitrate  60 mg Oral Daily    morphine injection  1 mg Intravenous Once   pantoprazole  40 mg Oral Daily   sodium chloride flush  3 mL Intravenous Q12H   sodium chloride flush  3 mL Intravenous Q12H   ticagrelor  90 mg Oral BID   Continuous Infusions:  sodium chloride     heparin 1,250 Units/hr (12/09/22 0017)   nitroGLYCERIN Stopped (12/08/22 2312)   sodium chloride     PRN Meds: sodium chloride, acetaminophen, HYDROcodone-acetaminophen, LORazepam, menthol-cetylpyridinium, nitroGLYCERIN, ondansetron (ZOFRAN) IV, sodium chloride flush   Vital Signs    Vitals:   12/09/22 0453 12/09/22 0620 12/09/22 0700 12/09/22 0742  BP: (!) 155/60   (!) 127/47  Pulse: 76  88   Resp: 16   18  Temp:    98.6 F (37 C)  TempSrc:    Oral  SpO2: 94%  96%   Weight:  105 kg    Height:        Intake/Output Summary (Last 24 hours) at 12/09/2022 4098 Last data filed at 12/09/2022 0700 Gross per 24 hour  Intake 1761.34 ml  Output 1500 ml  Net 261.34 ml      12/09/2022    6:20 AM 12/08/2022    5:38 AM 12/07/2022    5:00 AM  Last 3 Weights  Weight (lbs) 231 lb 8 oz 236 lb 12.4 oz 235 lb 14.3 oz  Weight (kg) 105.008 kg 107.4 kg 107 kg       Telemetry    SR rare PVC- Personally Reviewed  ECG    No new tracing - Personally Reviewed  Physical Exam   GEN:  No distress Neck: No JVD Cardiac: RRR, systolic murmur; no, rubs, or gallops.  Respiratory: Crackles in the right base GI: Soft, nontender, non-distended  MS: No edema; No deformity. Neuro:  Nonfocal  Psych: Normal affect   Labs    High Sensitivity Troponin:   Recent Labs  Lab 12/07/22 0620 12/07/22 2258 12/08/22 0159 12/08/22 0802 12/08/22 0931  TROPONINIHS 8 10 628* 2,035* 1,740*     Chemistry Recent Labs  Lab 12/06/22 1045 12/07/22 0253 12/08/22 0159 12/09/22 0618  NA 135 135 136 135  K 4.1 4.0 4.1 3.8  CL 99 100 102 103  CO2 23 25 21* 20*  GLUCOSE 213* 156* 184* 162*  BUN 65* 65* 64* 56*  CREATININE 2.73* 2.74* 2.89* 2.75*  CALCIUM 8.8* 8.6* 8.4* 8.2*  PROT 7.3  --   --   --   ALBUMIN 3.2*  --   --   --  AST 17  --   --   --   ALT 18  --   --   --   ALKPHOS 139*  --   --   --   BILITOT 0.5  --   --   --   GFRNONAA 19* 19* 18* 19*  ANIONGAP 13 10 13 12     Lipids No results for input(s): "CHOL", "TRIG", "HDL", "LABVLDL", "LDLCALC", "CHOLHDL" in the last 168 hours.  Hematology Recent Labs  Lab 12/07/22 0253 12/08/22 0159 12/09/22 0618  WBC 11.8* 17.2* 15.1*  RBC 3.52* 3.32* 3.67*  HGB 9.2* 8.9* 9.7*  HCT 29.5* 28.2* 30.4*  MCV 83.8 84.9 82.8  MCH 26.1 26.8 26.4  MCHC 31.2 31.6 31.9  RDW 15.4 15.2 15.5  PLT 221 238 242   Thyroid No results for input(s): "TSH", "FREET4" in the last 168 hours.  BNP Recent Labs  Lab 12/06/22 1814 12/08/22 0159  BNP 143.5* 149.2*    DDimer No results for input(s): "DDIMER" in the last 168 hours.   Radiology    CARDIAC CATHETERIZATION  Result Date: 12/08/2022   Prox LAD to Mid LAD lesion is 95% stenosed. - In-stent Re-stenosis   Scoring balloon angioplasty was performed using a BALLN SCOREFLEX 2.75X15.=> Post intervention, there is a 15% residual stenosis.   Mid LAD to Dist LAD lesion  is 60% stenosed.   Mid Cx stent - patent up to 1st Mrg.- but occluded beyond the 1st Mrg.  1st Mrg-1 lesion is 30% stenosed. 1st Mrg-2 lesion is 60% stenosed.   Mid Cx to Dist Cx lesion is 100% stenosed.   Prox RCA lesion is 85% stenosed.   Dist RCA stent - patent.   ----------------------------   LV end diastolic pressure is moderately elevated.   There is no aortic valve stenosis. POST-CATH DIAGNOSES Severe Multivessel Disease: Likely culprit lesion is 90% ISR are in the proximal and mid portion of the previously based LAD DES => successful scoring balloon angioplasty with 2.75 mm x 15 mm ScoreFlex balloon to high atmospheres = > 90% reduced to ~20% Severe ~85% Ostial-Prox RCA (with sever Cahteter Damping during engagement = Will need Staged Intervention once Renal Fxn is stable. Known occlusion of LCx after OM1 (stent occlusion).  OM1 has diffuse extensive disease-no change from previous cath.. Mod-Severely Elevated LVEDP ~23 mmHg. RECOMMENDATIONS Will run gentle IVF at 50 mL in our for 12 hours.  I have given her 1 dose of IV Lasix and may require additional dosing on the floor. She will need staged PCI on the RCA at a later date once renal function stable.  At that time depending on renal function may consider IVUS versus OCT evaluation of LAD stents to determine etiology of ISR.Marland Kitchen  Not performed today due to contrast concerns. Bryan Lemma, MD   DG CHEST PORT 1 VIEW  Result Date: 12/08/2022 CLINICAL DATA:  Dyspnea. EXAM: PORTABLE CHEST 1 VIEW COMPARISON:  12/06/2022 FINDINGS: The cardio pericardial silhouette is enlarged. Diffuse interstitial and basilar airspace disease is mildly progressive in the interval. Mitral clips and CardioMEMS pulmonary arterial device again noted Bones are diffusely demineralized. Telemetry leads overlie the chest. IMPRESSION: Interval progression of diffuse interstitial and basilar airspace disease, likely edema. Electronically Signed   By: Kennith Center M.D.   On: 12/08/2022  07:25    Cardiac Studies   Cardiac Studies & Procedures   CARDIAC CATHETERIZATION  CARDIAC CATHETERIZATION 12/08/2022  Narrative   Prox LAD to Mid LAD lesion is 95% stenosed. -  In-stent Re-stenosis   Scoring balloon angioplasty was performed using a BALLN SCOREFLEX 2.75X15.=> Post intervention, there is a 15% residual stenosis.   Mid LAD to Dist LAD lesion is 60% stenosed.   Mid Cx stent - patent up to 1st Mrg.- but occluded beyond the 1st Mrg.  1st Mrg-1 lesion is 30% stenosed. 1st Mrg-2 lesion is 60% stenosed.   Mid Cx to Dist Cx lesion is 100% stenosed.   Prox RCA lesion is 85% stenosed.   Dist RCA stent - patent.   ----------------------------   LV end diastolic pressure is moderately elevated.   There is no aortic valve stenosis.  POST-CATH DIAGNOSES Severe Multivessel Disease: Likely culprit lesion is 90% ISR are in the proximal and mid portion of the previously based LAD DES => successful scoring balloon angioplasty with 2.75 mm x 15 mm ScoreFlex balloon to high atmospheres = > 90% reduced to ~20% Severe ~85% Ostial-Prox RCA (with sever Cahteter Damping during engagement = Will need Staged Intervention once Renal Fxn is stable. Known occlusion of LCx after OM1 (stent occlusion).  OM1 has diffuse extensive disease-no change from previous cath.. Mod-Severely Elevated LVEDP ~23 mmHg.   RECOMMENDATIONS Will run gentle IVF at 50 mL in our for 12 hours.  I have given her 1 dose of IV Lasix and may require additional dosing on the floor. She will need staged PCI on the RCA at a later date once renal function stable.  At that time depending on renal function may consider IVUS versus OCT evaluation of LAD stents to determine etiology of ISR.Marland Kitchen  Not performed today due to contrast concerns.    Bryan Lemma, MD  Findings Coronary Findings Diagnostic  Dominance: Right  Left Main No significant disease.  Left Anterior Descending The LAD is diffusely diseased. The proximal vessel  has nonobstructive disease. The mid vessel stent is patent. The mid/distal vessel has 60% stenosis beyond the stent. The distal vessel is diffusely diseased. Prox LAD to Mid LAD lesion is 95% stenosed. The lesion is segmental and eccentric. The lesion is moderately calcified. The lesion was previously treated using a drug eluting stent between 1-5 months ago. Previously placed stent displays restenosis. RFR 0.48 Non-stenotic Mid LAD lesion was previously treated. Mid LAD to Dist LAD lesion is 60% stenosed.  Left Circumflex Large OM1.  30% mid LCx stenosis after OM1.  Moderate OM2 with 30-40% proximal stenosis.  30% distal LCx stenosis. Non-stenotic Mid Cx lesion was previously treated. The lesion is moderately calcified. Mid Cx to Dist Cx lesion is 100% stenosed. The lesion was previously treated .  First Obtuse Marginal Branch There is moderate disease in the vessel. 1st Mrg-1 lesion is 30% stenosed. The lesion was previously treated . 1st Mrg-2 lesion is 60% stenosed.  Third Obtuse Marginal Branch Collaterals 3rd Mrg filled by collaterals from [facility]PA[facility].  Right Coronary Artery There is moderate diffuse disease throughout the vessel. Serial 30-40% stenoses in the proximal RCA.  Long stented segment in the PDA was patent. Prox RCA lesion is 85% stenosed. The lesion is focal. The lesion is calcified. Significant catheter damping with engagement. Non-stenotic Dist RCA lesion was previously treated.  Intervention  Prox LAD to Mid LAD lesion Angioplasty Lesion length:  35 mm. Scoring balloon angioplasty was performed using a BALLN SCOREFLEX 2.75X15. BALLN EMERGE MR 2.0X12 -multiple inflations to 12 atm for 20 seconds to allow for larger balloon placement Maximum pressure: 16 atm. Total of 4 inflations performed from the very proximal portion of stent to  the distal portion of the stent to high ATM with good expansion. Post-Intervention Lesion Assessment The intervention was successful.  Pre-interventional TIMI flow is 3. Post-intervention TIMI flow is 3. Treated lesion length:  40 mm. No complications occurred at this lesion. 60 mL total contrast There is a 15% residual stenosis post intervention.   CARDIAC CATHETERIZATION  CARDIAC CATHETERIZATION 09/07/2022  Narrative   Mid LAD to Dist LAD lesion is 60% stenosed.   Prox RCA lesion is 60% stenosed.   1st Mrg-1 lesion is 30% stenosed.   Mid Cx-2 lesion is 100% stenosed.   1st Mrg-2 lesion is 60% stenosed.   Prox LAD to Mid LAD lesion is 90% stenosed.   A drug-eluting stent was successfully placed using a SYNERGY XD 2.75X28.   Post intervention, there is a 0% residual stenosis.   Mid LAD lesion is 40% stenosed.   Non-stenotic Mid Cx-1 lesion was previously treated.   Non-stenotic Dist RCA lesion was previously treated.   Post intervention, there is a 0% residual stenosis.   LV end diastolic pressure is mildly elevated.  2 vessel obstructive CAD. New 90% stenosis in the proximal LAD with RFR 0.48. The stent in the LCx is occluded after a large first OM Patent stent in the distal RCA Mildly elevated LVEDP 21 mm Hg Successful PCI of the proximal LAD with DES x 1 overlapping with prior stent.  Plan: observe overnight. Anticipate DC in am. DAPT indefinitely with ASA/Plavix.  Will treat LCx occlusion medically.  Findings Coronary Findings Diagnostic  Dominance: Right  Left Main No significant disease.  Left Anterior Descending The LAD is diffusely diseased. The proximal vessel has nonobstructive disease. The mid vessel stent is patent. The mid/distal vessel has 60% stenosis beyond the stent. The distal vessel is diffusely diseased. Prox LAD to Mid LAD lesion is 90% stenosed. The lesion is moderately calcified. Pressure wire/FFR was performed on the lesion. RFR 0.48 Mid LAD lesion is 40% stenosed. The lesion was previously treated . Mid LAD to Dist LAD lesion is 60% stenosed.  Left Circumflex Large OM1.  30% mid  LCx stenosis after OM1.  Moderate OM2 with 30-40% proximal stenosis.  30% distal LCx stenosis. Non-stenotic Mid Cx-1 lesion was previously treated. The lesion is moderately calcified. Mid Cx-2 lesion is 100% stenosed.  First Obtuse Marginal Branch 1st Mrg-1 lesion is 30% stenosed. The lesion was previously treated . 1st Mrg-2 lesion is 60% stenosed.  Third Obtuse Marginal Branch Collaterals 3rd Mrg filled by collaterals from RPAV.  Right Coronary Artery There is moderate diffuse disease throughout the vessel. Serial 30-40% stenoses in the proximal RCA.  Long stented segment in the PDA was patent. Prox RCA lesion is 60% stenosed. The lesion is calcified. Non-stenotic Dist RCA lesion was previously treated.  Intervention  Prox LAD to Mid LAD lesion Stent (Also treats lesions: Mid LAD) CATH VISTA GUIDE 6FR XBLAD3.5 guide catheter was inserted. Lesion crossed with guidewire using a WIRE ASAHI PROWATER 180CM. Pre-stent angioplasty was performed using a BALLN SCOREFLEX 2.50X10. A drug-eluting stent was successfully placed using a SYNERGY XD 2.75X28. Stent strut is well apposed. Stent overlaps previously placed stent. Post-stent angioplasty was performed using a BALL SAPPHIRE NC24 [id number]. Maximum pressure:  20 atm. Post-Intervention Lesion Assessment The intervention was successful. Pre-interventional TIMI flow is 3. Post-intervention TIMI flow is 3. No complications occurred at this lesion. There is a 0% residual stenosis post intervention.  Mid LAD lesion Stent (Also treats lesions: Prox LAD to Mid LAD) See details  in Prox LAD to Mid LAD lesion. Post-Intervention Lesion Assessment The intervention was successful. Pre-interventional TIMI flow is 3. Post-intervention TIMI flow is 3. No complications occurred at this lesion. There is a 0% residual stenosis post intervention.     ECHOCARDIOGRAM  ECHOCARDIOGRAM COMPLETE 07/18/2022  Narrative ECHOCARDIOGRAM REPORT    Patient Name:    Dana [name] Date of Exam: 07/18/2022 Medical Rec #:  [id number]      Height:       64.0 in Accession #:    [id number]     Weight:       234.0 lb Date of Birth:  October 31, 1960      BSA:          2.091 m Patient Age:    61 years       BP:           122/60 mmHg Patient Gender: F              HR:           70 bpm. Exam Location:  [address]  Procedure: 2D Echo, Cardiac Doppler, Color Doppler and Intracardiac Opacification Agent  Indications:    Z98.890 S/P Mitral valve clip implantation; I34.0 Mitral valve insufficiency  History:        Patient has prior history of Echocardiogram examinations, most recent 09/18/2021. CHF, CAD; Risk Factors:Diabetes, Dyslipidemia and Sleep Apnea. S/P ablation of atrial fibrillation. Chronic pain syndrome. Obesity. PVD.  Mitral Valve: Mitra-Clip valve is present in the mitral position. Procedure Date: 05/18/2021.  Sonographer:    Cathie Beams RCS Referring Phys: Fritzi Mandes [facility]  IMPRESSIONS   1. Left ventricular ejection fraction, by estimation, is 55 to 60%. The left ventricle has normal function. The left ventricle has no regional wall motion abnormalities. Indeterminate diastolic filling due to E-A fusion. 2. Right ventricular systolic function is normal. The right ventricular size is normal. 3. Left atrial size was mildly dilated. 4. The mitral valve has been repaired/replaced. Moderate mitral valve regurgitation. The mean mitral valve gradient is 7.5 mmHg with average heart rate of 75 bpm. There is a Mitra-Clip present in the mitral position. Procedure Date: 05/18/2021. 5. The aortic valve is grossly normal. Aortic valve regurgitation is not visualized. No aortic stenosis is present. 6. The inferior vena cava is normal in size with greater than 50% respiratory variability, suggesting right atrial pressure of 3 mmHg.  Comparison(s): No significant change from prior study.  Conclusion(s)/Recommendation(s): S/P Mitraclip x2. Study unchanged from  prior.  FINDINGS Left Ventricle: Left ventricular ejection fraction, by estimation, is 55 to 60%. The left ventricle has normal function. The left ventricle has no regional wall motion abnormalities. Definity contrast agent was given IV to delineate the left ventricular endocardial borders. The left ventricular internal cavity size was normal in size. There is no left ventricular hypertrophy. Indeterminate diastolic filling due to E-A fusion.  Right Ventricle: The right ventricular size is normal. No increase in right ventricular wall thickness. Right ventricular systolic function is normal.  Left Atrium: Left atrial size was mildly dilated.  Right Atrium: Right atrial size was normal in size.  Pericardium: There is no evidence of pericardial effusion.  Mitral Valve: The mitral valve has been repaired/replaced. Moderate mitral valve regurgitation. There is a Mitra-Clip present in the mitral position. Procedure Date: 05/18/2021. MV peak gradient, 20.8 mmHg. The mean mitral valve gradient is 7.5 mmHg with average heart rate of 75 bpm.  Tricuspid Valve: The tricuspid valve is normal in structure. Tricuspid  valve regurgitation is trivial. No evidence of tricuspid stenosis.  Aortic Valve: The aortic valve is grossly normal. Aortic valve regurgitation is not visualized. No aortic stenosis is present.  Pulmonic Valve: The pulmonic valve was not well visualized. Pulmonic valve regurgitation is trivial. No evidence of pulmonic stenosis.  Aorta: The aortic root, ascending aorta, aortic arch and descending aorta are all structurally normal, with no evidence of dilitation or obstruction.  Venous: The inferior vena cava is normal in size with greater than 50% respiratory variability, suggesting right atrial pressure of 3 mmHg.  IAS/Shunts: The atrial septum is grossly normal.   LEFT VENTRICLE PLAX 2D LVIDd:         5.10 cm LVIDs:         3.30 cm LV PW:         0.80 cm LV IVS:        0.80  cm LVOT diam:     1.90 cm LV SV:         72 LV SV Index:   34 LVOT Area:     2.84 cm   RIGHT VENTRICLE TAPSE (M-mode): 2.0 cm  LEFT ATRIUM             Index        RIGHT ATRIUM           Index LA diam:        4.00 cm 1.91 cm/m   RA Area:     13.30 cm LA Vol (A2C):   59.5 ml 28.45 ml/m  RA Volume:   29.40 ml  14.06 ml/m LA Vol (A4C):   76.2 ml 36.44 ml/m LA Biplane Vol: 68.2 ml 32.61 ml/m AORTIC VALVE LVOT Vmax:   102.00 cm/s LVOT Vmean:  68.600 cm/s LVOT VTI:    0.253 m  AORTA Ao Root diam: 3.00 cm Ao Asc diam:  3.10 cm  MITRAL VALVE MV Area (PHT): 3.54 cm     SHUNTS MV Area VTI:   1.30 cm     Systemic VTI:  0.25 m MV Peak grad:  20.8 mmHg    Systemic Diam: 1.90 cm MV Mean grad:  7.5 mmHg MV Vmax:       2.28 m/s MV Vmean:      125.5 cm/s MV Decel Time: 214 msec MR Peak grad: 116.2 mmHg MR Mean grad: 75.0 mmHg MR Vmax:      539.00 cm/s MR Vmean:     408.0 cm/s MV E velocity: 221.00 cm/s MV A velocity: 114.00 cm/s MV E/A ratio:  1.94  Jodelle Red MD Electronically signed by Jodelle Red MD Signature Date/Time: 07/18/2022/4:46:03 PM    Final   TEE  ECHO TEE 05/18/2021  Narrative TRANSESOPHOGEAL ECHO REPORT    Patient Name:   Dana [name] Date of Exam: 05/18/2021 Medical Rec #:  [id number]      Height:       64.5 in Accession #:    [id number]     Weight:       231.5 lb Date of Birth:  1961-06-20      BSA:          2.093 m Patient Age:    60 years       BP:           105/41 mmHg Patient Gender: F              HR:           65 bpm. Exam Location:  Inpatient  Procedure: 3D Echo, Cardiac Doppler, Color Doppler and Transesophageal Echo  Indications:     Severe mitral regurgitation [[id number]]  History:         Patient has prior history of Echocardiogram examinations, most recent 05/02/2021. CHF, Previous Myocardial Infarction and CAD, Arrythmias:Atrial Fibrillation, Signs/Symptoms:Dyspnea; Risk Factors:Hypertension, Dyslipidemia,  Diabetes and Sleep Apnea. Chronic kidney disease.  Mitral Valve: Mitral clip XT W, NT W valve is present in the mitral position. Procedure Date: 05/18/2021.  Sonographer:     Leta Jungling RDCS Referring Phys:  [id number] Orbie Pyo Diagnosing Phys: Thurmon Fair MD  PROCEDURE: After discussion of the risks and benefits of a TEE, an informed consent was obtained from the patient. The patient was intubated. The transesophogeal probe was passed without difficulty through the esophogus of the patient. Imaged were obtained with the patient in a supine position. Sedation performed by different physician. The patient was monitored while under deep sedation. Anesthestetic sedation was provided intravenously by Anesthesiology: 150mg  of Propofol. Image quality was good. The patient developed no complications during the procedure.  TEE with live 3D and 3D postprocessing was performed throughout all stages of the procedure, including transseptal puncture, device deployment, assessment of final results and evaluation for complications  PREPROCEDURE FINDINGS:  At baseline, left ventricular systolic function is normal. Estimated LVEF is 65%. There is hypokinesis of the basal inferolateral wall. There is severe mitral insuficiency with a central jet. There is malcoaptation of the leaflets, primarily due to tethering of the posterior leaflet. Baseline valve area by planimetry 5.7 cm sq. Mean gradient 3 mm Hg at 60 bpm. ERO area by 3D PISA was 0.7 cm sq. Regurgitant volume 132 ml, regurgitant fraction 65%. There is bilateral blunting of pulmonary vein systolic flow. There is a physiological amount of pericardial fluid.  POSTPROCEDURE FINDINGS:  After deployment of the initial MitraClip XTW, the device is well seated with excellent tissue bridge, but there is residual moderate mitral insufficiency lateral to the clip.  A second device, Mitraclip NTW was advanced lateral to the original one. Difficulty  was encountered in positioning of the second clip and it became apparent that the anterior leaflet was attached to the second clip, pulling it towards the posterior arm of the clip. Despite extensive efforts, the device could not be freed from the anterior leaflet, nor could the posterior leaflet be grasped. The device was deployed attached only to the anterior leaflet. Left ventricular systolic function remained normal. Estimated LVEF is 65%. There is unchanged hypokinesis of the basal inferolateral wall. Both clips appear stable in their position. There is moderate to severe mitral insuficiency with major regurgitant jets between the two clips and lateral to the second clip. There is a small iatrogenic secundum atrial septal defect with exclusively left-to-right shunt. With the caveats related to regurgitation evaluation using the continuity equation, regurgitant volume was 68 ml, regurgitant fraction 45%, calculated ERO area at the end of the procedure 0.5 cm sq. There is an unchanged physiological amount of pericardial fluid.    IMPRESSIONS   1. Left ventricular ejection fraction, by estimation, is 65%%. The left ventricle has normal function. The left ventricle demonstrates regional wall motion abnormalities (see scoring diagram/findings for description). Left ventricular diastolic parameters are consistent with Grade II diastolic dysfunction (pseudonormalization). Elevated left atrial pressure. 2. Right ventricular systolic function is normal. The right ventricular size is normal. 3. Left atrial size was mildly dilated. No left atrial/left atrial appendage thrombus was detected. 4. Right atrial size was mildly dilated. 5.  The mitral valve is abnormal. Severe mitral valve regurgitation. There is a Mitral clip XT W, NT W present in the mitral position. Procedure Date: 05/18/2021. 6. The aortic valve is tricuspid. Aortic valve regurgitation is not visualized. 7. Evidence of atrial level  shunting detected by color flow Doppler. There is a small patent foramen ovale with predominantly left to right shunting across the atrial septum.  FINDINGS Left Ventricle: Left ventricular ejection fraction, by estimation, is 65%%. The left ventricle has normal function. The left ventricle demonstrates regional wall motion abnormalities. The left ventricular internal cavity size was normal in size. There is no left ventricular hypertrophy. Left ventricular diastolic parameters are consistent with Grade II diastolic dysfunction (pseudonormalization).  Right Ventricle: The right ventricular size is normal. No increase in right ventricular wall thickness. Right ventricular systolic function is normal.  Left Atrium: Left atrial size was mildly dilated. No left atrial/left atrial appendage thrombus was detected.  Right Atrium: Right atrial size was mildly dilated.  Pericardium: Trivial pericardial effusion is present.  Mitral Valve: The mitral valve is abnormal. Severe mitral valve regurgitation. There is a Mitral clip XT W, NT W present in the mitral position. Procedure Date: 05/18/2021.  Tricuspid Valve: The tricuspid valve is normal in structure. Tricuspid valve regurgitation is trivial.  Aortic Valve: The aortic valve is tricuspid. Aortic valve regurgitation is not visualized.  Pulmonic Valve: The pulmonic valve was normal in structure. Pulmonic valve regurgitation is not visualized.  Aorta: The aortic root, ascending aorta and aortic arch are all structurally normal, with no evidence of dilitation or obstruction.  IAS/Shunts: Evidence of atrial level shunting detected by color flow Doppler. A small patent foramen ovale is detected with predominantly left to right shunting across the atrial septum.   LEFT VENTRICLE PLAX 2D LVOT diam:     2.10 cm LV SV:         67 LV SV Index:   32 LVOT Area:     3.46 cm   AORTIC VALVE LVOT Vmax:   78.40 cm/s LVOT Vmean:  57.600 cm/s LVOT VTI:     0.194 m  MR Peak grad:      111.3 mmHg MR Mean grad:      84.0 mmHg    SHUNTS MR Vmax:           527.50 cm/s  Systemic VTI:  0.19 m MR Vmean:          448.0 cm/s   Systemic Diam: 2.10 cm MR Vena Contracta: 0.52 cm MR PISA:           2.52 cm MR PISA Radius:    0.63 cm  Mihai Croitoru MD Electronically signed by Thurmon Fair MD Signature Date/Time: 05/18/2021/3:05:55 PM    Final   MONITORS  LONG TERM MONITOR (3-14 DAYS) 04/12/2021  Narrative Patch Wear Time:  14 days and 0 hours (2022-08-31T12:08:59-0400 to 2022-09-14T12:09:09-398)  1. Sinus rhythm - min HR of 51 bpm, max HR of 190 bpm, and avg HR of 70 bpm. Predominant 2. 2. One run of nonsustained Ventricular Tachycardia occurred lasting 5 beats with a max rate of 171 bpm (avg 128 bpm). 3. 66 runs of Supraventricular Tachycardia runs occurred, the run with the fastest interval lasting 5 beats with a max rate of 190 bpm, the longest lasting 16.6 secs with an avg rate of 125 bpm.  4. 4. Frequent PACs (11.2%, [id number]) 5. Rare PVCs  Arvilla Meres, MD 3:53 PM  Patient Profile     Dana [name] is a 62 y.o. female with a hx of chronic diastolic HF, cardiomems 02/2018, CAD with hx DES RCA and LAD -2018, 2022 DES mLCX, and PTCA in side branch of OM1 DM, HTN, HLD, PAD, OSA on CPAP, PAF s/p ablation, PAD s/p L great toe and 4th toe amputation, stage IIIb CKD who is being seen for the evaluation and management of chest pain.  Assessment & Plan    NSTEMI CAD With new lateral ST changes; s/p emergent LAD intervention 12/08/22 - on ASA and Brilinta - complicated by worsening AKI and increase creatinine (no evidence of CIN 12/09/22) - on DAPT - off nitro drip - nephrology is following - no plans for intervention on other residual disease at this time  AKI on CKD IIIb UTI - Home torsemide held; her home hydralazine has been held for hypotension - repeat IV lasix 80 mg X1  Acute on Chronic diastolic CHF, related to  volume prior to emergent cath - sees AHF (DB) and is s/p CardiMEMS, saw 12/08/22 - diuresis as above  Paroxysmal atrial fibrillation - Hx PAF s/p ablation >10 years ago no further not on AC; on BB  Hyperlipidemia - statin intolerant on  Repatha and Zetia.   Mitral regurgitation  - no evidence of SLDA; Echo is pending     For questions or updates, please contact [facility] Please consult [email or web address] for contact info under     Riley Lam, MD FASE [facility] Cardiologist [facility]  [address] [place], [address] [place], [place] [address] [phone]  8:22 AM

## 2022-12-09 NOTE — Progress Notes (Signed)
PROGRESS NOTE    Dana [name]  ZOX:[id number] DOB: 1961/06/03 DOA: 12/06/2022 PCP: Blane Ohara, MD    Brief Narrative:  62 year old with history of coronary artery disease status post multiple stenting, A-fib status post ablation, mitral regurgitation with MitraClip, sleep apnea on CPAP, obesity, cardiac arrest, chronic respiratory failure with hypoxemia, CKD stage IIIb with baseline creatinine about 1.9, anemia, hypertension, hyperlipidemia, gout, GERD, anxiety, depression and diastolic dysfunction presented with midsternal chest pain mostly on the left side transiently improved with nitroglycerin but recurred.  In the emergency room hemodynamically stable.  Creatinine 2.73 from recent baseline of 1.9.  Troponins negative.  EKG nonischemic.  Due to recurrent chest pain, case discussed with cardiology who recommended ischemia evaluation and patient admitted to the hospital.  5/25, overnight with worsening chest pain and shortness of breath.  Taken urgently to cardiac cath and stented LAD.  Developed acute hypoxemia and needed BiPAP overnight. 5/26, some clinical improvement.   Assessment & Plan:   Non-STEMI, Patient with extensive history of coronary artery disease, unstable angina: Overnight with worsening chest pain and elevated troponins. Loaded with Brilinta and on nitro drip.  ST changes noted. Taken to cardiac cath emergently Drug-eluting stent proximal LAD to mid LAD lesion that was 90% stenosed. Patient does have other multiple areas of stenosis. Currently on aspirin, Brilinta.   Taken off nitroglycerin drip. Remains on heparin infusion today.  Followed by cardiology.  Acute respiratory failure with hypoxemia, respiratory distress: Overnight with fluid overload, acute exacerbation of chronic diastolic congestive heart failure. Patient on oxygen and CPAP at night at home. Needed BiPAP overnight and responded well. Given oxygen to keep saturation more than 90%.  Intermittent  doses of diuretics.   Acute kidney injury with history of chronic kidney disease stage IIIb: Baseline creatinine about 1.9.  Multifactorial AKI including recent use of Bactrim. Holding antihypertensives as well as torsemide. Urine output is adequate.  More than 1800 mL last 24 hours. Intermittent Lasix, additional dose today.  Followed by nephrology.  UTI: Recently diagnosed at urology office.  Cultures not available.  He started on Bactrim and long-term Keflex for prophylaxis. Reculture of the urine, pending Discontinue Bactrim.  Continue Keflex 500 mg 3 times daily for 5 days then she will go back on her chronic suppressive therapy 250 mg daily at bedtime.  Hyperlipidemia, on Zetia and Repatha Patient's hypertension, Coreg and Imdur was continued.  Holding hydralazine and torsemide. A-fib, sinus rhythm post ablation. Mitral regurgitation post MitraClip. Type 2 diabetes, well-controlled.  Remains on long-acting insulin and sliding scale insulin. Sleep apnea, using CPAP.  BiPAP for respiratory distress. Gout, on home allopurinol. GERD, on PPI. Anxiety and depression, on BuSpar and Ativan.   DVT prophylaxis: Heparin infusion   Code Status: Full code Family Communication: Daughter at the bedside. Disposition Plan: Status is: Inpatient.  Active treatment of non-STEMI, respiratory distress     Consultants:  Cardiology  Procedures:  None  Antimicrobials:  Bactrim, discontinue Keflex 5 mg twice daily 5/24   Subjective:  Patient seen and examined.  Daughter at the bedside.  Denies any chest pain after the procedure.  She had hypoxemia with some respiratory status overnight and used BiPAP and currently on 4 L oxygen.  Objective: Vitals:   12/09/22 0453 12/09/22 0620 12/09/22 0700 12/09/22 0742  BP: (!) 155/60   (!) 127/47  Pulse: 76  88   Resp: 16   18  Temp:    98.6 F (37 C)  TempSrc:    Oral  SpO2: 94%  96%   Weight:  105 kg    Height:        Intake/Output  Summary (Last 24 hours) at 12/09/2022 1313 Last data filed at 12/09/2022 0700 Gross per 24 hour  Intake 1761.34 ml  Output 1500 ml  Net 261.34 ml    Filed Weights   12/07/22 0500 12/08/22 0538 12/09/22 0620  Weight: 107 kg 107.4 kg 105 kg    Examination:  General exam: Appears comfortable today..  In mild distress and tachypneic on interview. Respiratory system: No added sounds.  Poor inspiratory effort. SpO2: 96 % O2 Flow Rate (L/min): 8 L/min FiO2 (%): 44 %  Cardiovascular system: S1 & S2 heard, RRR.  Chronic nonpitting edema. Gastrointestinal system: Abdomen is nondistended, soft and nontender. No organomegaly or masses felt. Normal bowel sounds heard. Central nervous system: Alert and oriented. No focal neurological deficits. Extremities: Symmetric 5 x 5 power. Skin: No rashes, lesions or ulcers Psychiatry: Judgement and insight appear normal.     Data Reviewed: I have personally reviewed following labs and imaging studies  CBC: Recent Labs  Lab 12/04/22 1133 12/06/22 1045 12/07/22 0253 12/08/22 0159 12/09/22 0618  WBC 11.0* 17.3* 11.8* 17.2* 15.1*  NEUTROABS 7.6 14.9*  --   --   --   HGB 9.7* 9.3* 9.2* 8.9* 9.7*  HCT 31.6* 29.7* 29.5* 28.2* 30.4*  MCV 87.8 84.9 83.8 84.9 82.8  PLT 248 235 221 238 242    Basic Metabolic Panel: Recent Labs  Lab 12/06/22 1045 12/07/22 0253 12/08/22 0159 12/09/22 0618  NA 135 135 136 135  K 4.1 4.0 4.1 3.8  CL 99 100 102 103  CO2 23 25 21* 20*  GLUCOSE 213* 156* 184* 162*  BUN 65* 65* 64* 56*  CREATININE 2.73* 2.74* 2.89* 2.75*  CALCIUM 8.8* 8.6* 8.4* 8.2*    GFR: Estimated Creatinine Clearance: 26 mL/min (A) (by C-G formula based on SCr of 2.75 mg/dL (H)). Liver Function Tests: Recent Labs  Lab 12/06/22 1045  AST 17  ALT 18  ALKPHOS 139*  BILITOT 0.5  PROT 7.3  ALBUMIN 3.2*    No results for input(s): "LIPASE", "AMYLASE" in the last 168 hours. No results for input(s): "AMMONIA" in the last 168  hours. Coagulation Profile: No results for input(s): "INR", "PROTIME" in the last 168 hours. Cardiac Enzymes: No results for input(s): "CKTOTAL", "CKMB", "CKMBINDEX", "TROPONINI" in the last 168 hours. BNP (last 3 results) No results for input(s): "PROBNP" in the last 8760 hours. HbA1C: No results for input(s): "HGBA1C" in the last 72 hours. CBG: Recent Labs  Lab 12/08/22 1330 12/08/22 1539 12/08/22 2247 12/09/22 0741 12/09/22 1153  GLUCAP 183* 161* 165* 171* 244*    Lipid Profile: No results for input(s): "CHOL", "HDL", "LDLCALC", "TRIG", "CHOLHDL", "LDLDIRECT" in the last 72 hours. Thyroid Function Tests: No results for input(s): "TSH", "T4TOTAL", "FREET4", "T3FREE", "THYROIDAB" in the last 72 hours. Anemia Panel: No results for input(s): "VITAMINB12", "FOLATE", "FERRITIN", "TIBC", "IRON", "RETICCTPCT" in the last 72 hours. Sepsis Labs: Recent Labs  Lab 12/06/22 1456 12/06/22 1814  LATICACIDVEN 1.4 1.1     No results found for this or any previous visit (from the past 240 hour(s)).       Radiology Studies: CARDIAC CATHETERIZATION  Result Date: 12/08/2022   Prox LAD to Mid LAD lesion is 95% stenosed. - In-stent Re-stenosis   Scoring balloon angioplasty was performed using a BALLN SCOREFLEX 2.75X15.=> Post intervention, there is a 15% residual stenosis.   Mid LAD  to Dist LAD lesion is 60% stenosed.   Mid Cx stent - patent up to 1st Mrg.- but occluded beyond the 1st Mrg.  1st Mrg-1 lesion is 30% stenosed. 1st Mrg-2 lesion is 60% stenosed.   Mid Cx to Dist Cx lesion is 100% stenosed.   Prox RCA lesion is 85% stenosed.   Dist RCA stent - patent.   ----------------------------   LV end diastolic pressure is moderately elevated.   There is no aortic valve stenosis. POST-CATH DIAGNOSES Severe Multivessel Disease: Likely culprit lesion is 90% ISR are in the proximal and mid portion of the previously based LAD DES => successful scoring balloon angioplasty with 2.75 mm x 15 mm  ScoreFlex balloon to high atmospheres = > 90% reduced to ~20% Severe ~85% Ostial-Prox RCA (with sever Cahteter Damping during engagement = Will need Staged Intervention once Renal Fxn is stable. Known occlusion of LCx after OM1 (stent occlusion).  OM1 has diffuse extensive disease-no change from previous cath.. Mod-Severely Elevated LVEDP ~23 mmHg. RECOMMENDATIONS Will run gentle IVF at 50 mL in our for 12 hours.  I have given her 1 dose of IV Lasix and may require additional dosing on the floor. She will need staged PCI on the RCA at a later date once renal function stable.  At that time depending on renal function may consider IVUS versus OCT evaluation of LAD stents to determine etiology of ISR.Marland Kitchen  Not performed today due to contrast concerns. Bryan Lemma, MD   DG CHEST PORT 1 VIEW  Result Date: 12/08/2022 CLINICAL DATA:  Dyspnea. EXAM: PORTABLE CHEST 1 VIEW COMPARISON:  12/06/2022 FINDINGS: The cardio pericardial silhouette is enlarged. Diffuse interstitial and basilar airspace disease is mildly progressive in the interval. Mitral clips and CardioMEMS pulmonary arterial device again noted Bones are diffusely demineralized. Telemetry leads overlie the chest. IMPRESSION: Interval progression of diffuse interstitial and basilar airspace disease, likely edema. Electronically Signed   By: Kennith Center M.D.   On: 12/08/2022 07:25        Scheduled Meds:  sodium chloride   Intravenous Once   allopurinol  300 mg Oral Daily   aspirin EC  81 mg Oral Daily   busPIRone  5 mg Oral TID   carvedilol  12.5 mg Oral BID   [START ON 12/12/2022] cephALEXin  250 mg Oral QHS   cephALEXin  500 mg Oral Q12H   ezetimibe  10 mg Oral Daily   insulin aspart  0-15 Units Subcutaneous TID WC   insulin aspart  0-5 Units Subcutaneous QHS   insulin detemir  30 Units Subcutaneous Daily   isosorbide mononitrate  60 mg Oral Daily    morphine injection  1 mg Intravenous Once   pantoprazole  40 mg Oral Daily   sodium chloride  flush  3 mL Intravenous Q12H   sodium chloride flush  3 mL Intravenous Q12H   ticagrelor  90 mg Oral BID   Continuous Infusions:  sodium chloride     heparin 1,400 Units/hr (12/09/22 0938)   nitroGLYCERIN Stopped (12/08/22 2312)   sodium chloride       LOS: 2 days    Time spent: 35 minutes    Dorcas Carrow, MD [facility] Pager [phone]

## 2022-12-09 NOTE — Progress Notes (Signed)
RT called to bedside due to patient desaturation into low 70s while on CPAP and 6L oxygen. Patient switched to BIPAP settings of 13/6 with 6L oxygen with improvement in saturation to 97%.

## 2022-12-09 NOTE — Progress Notes (Addendum)
[facility] NEPHROLOGY PROGRESS NOTE  Assessment/ Plan: Pt is a 62 y.o. yo female  ith past medical history significant for CAD status post stent, chronic diastolic CHF, type I DM, HTN, HLD, PAD, OSA on CPAP, PAF status post ablation, PAD, CKD stage IIIb-IV, presented with chest pain, and a NTEMI seen as a consultation for AKI on CKD with the need of cardiac cath.  For CKD, she follows with Dr. Elenore [name] at [facility] health for CKD IIIB-IV. Recent baseline creatinine level anywhere between 1.8-2.3 also history of proteinuria in the past however the RAAS blockers was held because of recurrent AKI.   # Acute kidney injury on CKD IIIb-IV, b/l cr 1.8-2.3: Likely hemodynamically mediated due to combination of diuretics use, Bactrim and NSTEMI.  Urinalysis bland.  She is nonoliguric. -s/p cardiac cath on 5/25 with minimal use of contrast.  Patient received IV fluid and Lasix afterward per cardiology. -She is nonoliguric and creatinine level remains stable however she is reporting short of breath and exam consistent with mild fluid overload. Another dose of IV Lasix today. -dc  Bactrim. -Strict ins and out and daily lab monitoring.   # NSTEMI with ongoing chest pain: Cardiac cath on 5/25 with severe multivessel disease, in a standard restenosis status post balloon angioplasty.  She is clinically improved and on cardiac medication and anticoagulation per cardiology team.  # Chronic diastolic CHF: Order a dose of furosemide IV today.  Takes oral torsemide at home.   # Anemia of CKD: She gets erythropoietin injection with her hematologist. Hb stable, OP follow up for ESA.   # Urinary tract infection: It seems like she was started on Bactrim as a prophylaxis because of frequent UTI.  Discontinues Bactrim and switched to Keflex.  Subjective: Seen and examined at bedside.  She denies chest pain, nausea or vomiting but reports mild shortness of breath.  She is on oxygen.  Urine output recorded around  2.1 L.  Her daughter was presented at the bedside. Objective Vital signs in last 24 hours: Vitals:   12/09/22 0453 12/09/22 0620 12/09/22 0700 12/09/22 0742  BP: (!) 155/60   (!) 127/47  Pulse: 76  88   Resp: 16   18  Temp:    98.6 F (37 C)  TempSrc:    Oral  SpO2: 94%  96%   Weight:  105 kg    Height:       Weight change: -2.392 kg  Intake/Output Summary (Last 24 hours) at 12/09/2022 1036 Last data filed at 12/09/2022 0700 Gross per 24 hour  Intake 1761.34 ml  Output 1500 ml  Net 261.34 ml       Labs: RENAL PANEL Recent Labs  Lab 12/06/22 1045 12/07/22 0253 12/08/22 0159 12/09/22 0618  NA 135 135 136 135  K 4.1 4.0 4.1 3.8  CL 99 100 102 103  CO2 23 25 21* 20*  GLUCOSE 213* 156* 184* 162*  BUN 65* 65* 64* 56*  CREATININE 2.73* 2.74* 2.89* 2.75*  CALCIUM 8.8* 8.6* 8.4* 8.2*  ALBUMIN 3.2*  --   --   --     Liver Function Tests: Recent Labs  Lab 12/06/22 1045  AST 17  ALT 18  ALKPHOS 139*  BILITOT 0.5  PROT 7.3  ALBUMIN 3.2*   No results for input(s): "LIPASE", "AMYLASE" in the last 168 hours. No results for input(s): "AMMONIA" in the last 168 hours. CBC: Recent Labs    03/29/22 0955 04/25/22 0813 07/19/22 1529 08/07/22 1103 10/02/22  1043 10/02/22 1044 10/30/22 1053 12/04/22 1133 12/06/22 1045 12/07/22 0253 12/08/22 0159 12/09/22 0618  HGB  --    < >  --    < >  --  9.6*   < > 9.7* 9.3* 9.2* 8.9* 9.7*  MCV  --    < >  --    < >  --  89.0   < > 87.8 84.9 83.8 84.9 82.8  VITAMINB12 596  --  345  --  473  --   --   --   --   --   --   --   FOLATE 9.4  --  9.7  --   --  10.6  --   --   --   --   --   --   FERRITIN 141  --  108  --  93  --   --   --   --   --   --   --   TIBC 263  --  262  --  279  --   --   --   --   --   --   --   IRON 52  --  35  --  34  --   --   --   --   --   --   --    < > = values in this interval not displayed.    Cardiac Enzymes: No results for input(s): "CKTOTAL", "CKMB", "CKMBINDEX", "TROPONINI" in the last 168  hours. CBG: Recent Labs  Lab 12/08/22 0745 12/08/22 1330 12/08/22 1539 12/08/22 2247 12/09/22 0741  GLUCAP 190* 183* 161* 165* 171*    Iron Studies: No results for input(s): "IRON", "TIBC", "TRANSFERRIN", "FERRITIN" in the last 72 hours. Studies/Results: CARDIAC CATHETERIZATION  Result Date: 12/08/2022   Prox LAD to Mid LAD lesion is 95% stenosed. - In-stent Re-stenosis   Scoring balloon angioplasty was performed using a BALLN SCOREFLEX 2.75X15.=> Post intervention, there is a 15% residual stenosis.   Mid LAD to Dist LAD lesion is 60% stenosed.   Mid Cx stent - patent up to 1st Mrg.- but occluded beyond the 1st Mrg.  1st Mrg-1 lesion is 30% stenosed. 1st Mrg-2 lesion is 60% stenosed.   Mid Cx to Dist Cx lesion is 100% stenosed.   Prox RCA lesion is 85% stenosed.   Dist RCA stent - patent.   ----------------------------   LV end diastolic pressure is moderately elevated.   There is no aortic valve stenosis. POST-CATH DIAGNOSES Severe Multivessel Disease: Likely culprit lesion is 90% ISR are in the proximal and mid portion of the previously based LAD DES => successful scoring balloon angioplasty with 2.75 mm x 15 mm ScoreFlex balloon to high atmospheres = > 90% reduced to ~20% Severe ~85% Ostial-Prox RCA (with sever Cahteter Damping during engagement = Will need Staged Intervention once Renal Fxn is stable. Known occlusion of LCx after OM1 (stent occlusion).  OM1 has diffuse extensive disease-no change from previous cath.. Mod-Severely Elevated LVEDP ~23 mmHg. RECOMMENDATIONS Will run gentle IVF at 50 mL in our for 12 hours.  I have given her 1 dose of IV Lasix and may require additional dosing on the floor. She will need staged PCI on the RCA at a later date once renal function stable.  At that time depending on renal function may consider IVUS versus OCT evaluation of LAD stents to determine etiology of ISR.Marland Kitchen  Not performed today due to contrast concerns. Bryan Lemma, MD  DG CHEST PORT 1  VIEW  Result Date: 12/08/2022 CLINICAL DATA:  Dyspnea. EXAM: PORTABLE CHEST 1 VIEW COMPARISON:  12/06/2022 FINDINGS: The cardio pericardial silhouette is enlarged. Diffuse interstitial and basilar airspace disease is mildly progressive in the interval. Mitral clips and CardioMEMS pulmonary arterial device again noted Bones are diffusely demineralized. Telemetry leads overlie the chest. IMPRESSION: Interval progression of diffuse interstitial and basilar airspace disease, likely edema. Electronically Signed   By: Kennith Center M.D.   On: 12/08/2022 07:25    Medications: Infusions:  sodium chloride     heparin 1,400 Units/hr (12/09/22 1610)   nitroGLYCERIN Stopped (12/08/22 2312)   sodium chloride      Scheduled Medications:  sodium chloride   Intravenous Once   allopurinol  300 mg Oral Daily   aspirin EC  81 mg Oral Daily   busPIRone  5 mg Oral TID   carvedilol  12.5 mg Oral BID   [START ON 12/12/2022] cephALEXin  250 mg Oral QHS   cephALEXin  500 mg Oral Q12H   ezetimibe  10 mg Oral Daily   insulin aspart  0-15 Units Subcutaneous TID WC   insulin aspart  0-5 Units Subcutaneous QHS   insulin detemir  30 Units Subcutaneous Daily   isosorbide mononitrate  60 mg Oral Daily    morphine injection  1 mg Intravenous Once   pantoprazole  40 mg Oral Daily   sodium chloride flush  3 mL Intravenous Q12H   sodium chloride flush  3 mL Intravenous Q12H   ticagrelor  90 mg Oral BID    have reviewed scheduled and prn medications.  Physical Exam: General:NAD, comfortable Heart:RRR, s1s2 nl Lungs: Bibasal rhonchi, no wheeze Abdomen:soft, Non-tender, non-distended Extremities: Bilateral leg edema present++ Neurology: Alert awake and following commands  Dana [name] 12/09/2022,10:36 AM  LOS: 2 days

## 2022-12-09 NOTE — Progress Notes (Unsigned)
Attempted Echo; patient wanted to eat lunch first. Will try again later  Dondra Prader RVT RCS

## 2022-12-09 NOTE — Progress Notes (Signed)
Pt continues with intermittent epistaxis.  Continues with nausea, no relief after zofran administration.  Pt just vomited small amount of coffee ground emesis.  Dr. Imogene Burn notified.  Awaiting further orders.

## 2022-12-09 NOTE — Progress Notes (Signed)
ANTICOAGULATION CONSULT NOTE - Follow Up Consult  Pharmacy Consult for heparin Indication: chest pain/ACS  Allergies  Allergen Reactions   Naproxen Hives and Rash   Shellfish-Derived Products Hives, Swelling and Other (See Comments)    Angioedema    Strawberry (Diagnostic) Hives   Celecoxib Other (See Comments)    Stomach pain   Glucosamine Hives, Swelling and Other (See Comments)    Angioedema    Iron     IV iron transfusion - hives - Feb 2022 hospitalization   Metoclopramide Other (See Comments)    Facial twitching and stuttering   Metolazone Other (See Comments)    Acute renal failure   Statins Other (See Comments)    Muscle pain    Patient Measurements: Height: 5\' 4"  (162.6 cm) Weight: 105 kg (231 lb 8 oz) IBW/kg (Calculated) : 54.7 Heparin Dosing Weight: 79.8kg  Vital Signs: Temp: 98.6 F (37 C) (05/26 0742) Temp Source: Oral (05/26 0742) BP: 127/47 (05/26 0742) Pulse Rate: 88 (05/26 0700)  Labs: Recent Labs    12/07/22 0253 12/07/22 0620 12/08/22 0159 12/08/22 0802 12/08/22 0931 12/09/22 0618 12/09/22 1517  HGB 9.2*  --  8.9*  --   --  9.7*  --   HCT 29.5*  --  28.2*  --   --  30.4*  --   PLT 221  --  238  --   --  242  --   HEPARINUNFRC  --   --   --  0.26*  --  0.21* 0.33  CREATININE 2.74*  --  2.89*  --   --  2.75*  --   TROPONINIHS  --    < > 628* 2,035* 1,740*  --   --    < > = values in this interval not displayed.     Estimated Creatinine Clearance: 26 mL/min (A) (by C-G formula based on SCr of 2.75 mg/dL (H)).   Medical History: Past Medical History:  Diagnosis Date   Acquired absence of left great toe (HCC)    Acquired absence of other left toe(s) (HCC)    ACS (acute coronary syndrome) (HCC)    Anemia    Anoxic brain injury (HCC)    Anxiety    CAD (coronary artery disease)    DES mLAD 04/07/15; DES dRCA & DES pPDA 08/30/16; DES mCX & PTCA OM1 09/29/20   Chronic diastolic (congestive) heart failure (HCC)    Chronic pain    Chronic  systolic (congestive) heart failure (HCC)    CKD (chronic kidney disease)    Contrast dye induced nephropathy, possible 09/03/2020   COPD (chronic obstructive pulmonary disease) (HCC)    Diabetes (HCC)type 1    Diastolic CHF (HCC)    Dyspnea    Family history of breast cancer 10/23/2021   Gastro-esophageal reflux disease without esophagitis    Hyperlipidemia    Hypertension    Hypertensive heart disease with heart failure (HCC)    Hypoxemia    Idiopathic gout, multiple sites    Leukocytosis 03/29/2022   Localized edema    Low back pain    Mitral regurgitation    Mitral regurgitation    Mixed hyperlipidemia    Mixed incontinence    Morbid (severe) obesity due to excess calories (HCC)    Neuromuscular disorder (HCC)    Neuropathy    Non-ST elevation (NSTEMI) myocardial infarction (HCC)    08/29/20, 09/29/20   OSA (obstructive sleep apnea) 09/03/2020   Other specified hypothyroidism    PAF (paroxysmal atrial  fibrillation) (HCC)    s/p pulmonary vein isolation by cryoablation 10/04/15 Dr. Rudolpho Sevin in [facility]   PEA (Pulseless electrical activity) [facility])    ~ 01/13/21 at [facility] during admission DKA, volume overload, possible CAP, hypoxia s/p ACLS with ROSC ~ 10-15   Pneumonia    Primary insomnia    Rheumatoid arthritis (HCC)    Stroke (HCC)    Thyroid disease    Type 1 diabetes mellitus (HCC)    Vitamin D deficiency     Medications:  Medications Prior to Admission  Medication Sig Dispense Refill Last Dose   allopurinol (ZYLOPRIM) 300 MG tablet Take 1 tablet (300 mg total) by mouth daily. 90 tablet 1 12/06/2022   aspirin EC 81 MG tablet Take 1 tablet (81 mg total) by mouth daily. Swallow whole. 30 tablet 12 12/06/2022   busPIRone (BUSPAR) 5 MG tablet Take 1 tablet (5 mg total) by mouth 3 (three) times daily. 270 tablet 1 12/06/2022   carvedilol (COREG) 12.5 MG tablet TAKE ONE TABLET BY MOUTH TWICE DAILY (Patient taking differently: Take 12.5 mg by mouth 2 (two) times daily  with a meal.) 180 tablet 3 12/06/2022 at 0800   cephALEXin (KEFLEX) 250 MG capsule Take 250 mg by mouth at bedtime.   12/05/2022   clopidogrel (PLAVIX) 75 MG tablet Take 1 tablet (75 mg total) by mouth daily. 90 tablet 1 12/05/2022   CRANBERRY PO Take 2 tablets by mouth daily.   12/06/2022   estradiol (ESTRACE) 0.1 MG/GM vaginal cream Place 1 Applicatorful vaginally every other day.   12/05/2022   Evolocumab (REPATHA SURECLICK) 140 MG/ML SOAJ Inject 1 Dose into the skin every 14 (fourteen) days. 6 mL 1 Past Week   ezetimibe (ZETIA) 10 MG tablet Take 1 tablet (10 mg total) by mouth daily. 90 tablet 1 12/05/2022   Furosemide (FUROSCIX [place]) Inject into the skin.      hydrALAZINE (APRESOLINE) 25 MG tablet Take 1 tablet (25 mg total) by mouth 3 (three) times daily. 90 tablet 8 12/06/2022   HYDROcodone-acetaminophen (NORCO) 7.5-325 MG tablet TAKE ONE TABLET BY MOUTH THREE TIMES DAILY AS NEEDED (Patient taking differently: Take 1 tablet by mouth 3 (three) times daily as needed for moderate pain.) 90 tablet 0 12/06/2022   insulin aspart (NOVOLOG FLEXPEN) 100 UNIT/ML FlexPen 2-10 U before meals based on SSI. 15 mL 3 12/06/2022   isosorbide mononitrate (IMDUR) 30 MG 24 hr tablet Take 1 tablet (30 mg total) by mouth daily. 90 tablet 1 12/06/2022   latanoprost (XALATAN) 0.005 % ophthalmic solution Place 1 drop into both eyes at bedtime.   12/05/2022   LEVEMIR FLEXPEN 100 UNIT/ML FlexPen Inject 50 units into THE SKIN daily (Patient taking differently: Inject 50 Units into the skin daily.) 15 mL 1 12/06/2022   linaclotide (LINZESS) 145 MCG CAPS capsule Take 1 capsule (145 mcg total) by mouth daily before breakfast. 90 capsule 1 12/06/2022   LORazepam (ATIVAN) 0.5 MG tablet TAKE ONE TABLET BY MOUTH ONCE DAILY AS NEEDED FOR ANXIETY (Patient taking differently: Take 0.5 mg by mouth every 6 (six) hours as needed for anxiety.) 30 tablet 2 12/06/2022   magnesium oxide (MAG-OX) 400 MG tablet Take 2 tablets (800 mg total) by mouth  daily. Take 2 (400 mg) daily=800 mg 60 tablet 1 12/06/2022   nitroGLYCERIN (NITROSTAT) 0.4 MG SL tablet Dissolve 1 tab under tongue as needed for chest pain. May repeat every 5 minutes x 2 doses. If no relief call 9-1-1. Please call [phone] to  schedule an appointment for future refills. Thank you. 50 tablet 0 12/06/2022   Omega-3 Fatty Acids (FISH OIL PO) Take 1 capsule by mouth daily.   12/06/2022   OXYGEN Inhale 2 L into the lungs at bedtime.   12/06/2022   pantoprazole (PROTONIX) 40 MG tablet Take 1 tablet (40 mg total) by mouth daily. 90 tablet 1 12/06/2022   potassium chloride SA (KLOR-CON M) 20 MEQ tablet TAKE ONE TABLET BY MOUTH ONCE daily 90 tablet 1 12/06/2022   Probiotic CAPS Take 1 capsule by mouth daily.   12/06/2022   promethazine (PHENERGAN) 25 MG tablet Take 1 tablet (25 mg total) by mouth daily as needed. (Patient taking differently: Take 25 mg by mouth daily as needed for nausea or vomiting.) 30 tablet 3 12/06/2022   sulfamethoxazole-trimethoprim (BACTRIM DS) 800-160 MG tablet Take 1 tablet by mouth 2 (two) times daily.   12/06/2022   torsemide (DEMADEX) 20 MG tablet TAKE 4 TABLETS BY MOUTH ONCE DAILY (Patient taking differently: Take 80 mg by mouth daily.) 336 tablet 3 12/06/2022   Continuous Blood Gluc Sensor (FREESTYLE LIBRE 2 SENSOR) MISC USE TO check blood glucose AS DIRECTED AND CHANGE sensor every 14 DAYS      EPINEPHrine 0.3 mg/0.3 mL IJ SOAJ injection Use as directed for life-threatening allergic reaction. 4 Device 3    Insulin Pen Needle (BD PEN NEEDLE NANO 2ND GEN) 32G X 4 MM MISC Inject 1 each into the skin in the morning, at noon, in the evening, and at bedtime. 600 each 3    Scheduled:   sodium chloride   Intravenous Once   allopurinol  300 mg Oral Daily   aspirin EC  81 mg Oral Daily   busPIRone  5 mg Oral TID   carvedilol  12.5 mg Oral BID   [START ON 12/12/2022] cephALEXin  250 mg Oral QHS   cephALEXin  500 mg Oral Q12H   ezetimibe  10 mg Oral Daily   insulin aspart   0-15 Units Subcutaneous TID WC   insulin aspart  0-5 Units Subcutaneous QHS   insulin detemir  30 Units Subcutaneous Daily   isosorbide mononitrate  60 mg Oral Daily    morphine injection  1 mg Intravenous Once   pantoprazole  40 mg Oral Daily   sodium chloride flush  3 mL Intravenous Q12H   sodium chloride flush  3 mL Intravenous Q12H   ticagrelor  90 mg Oral BID   Infusions:   sodium chloride     heparin 1,400 Units/hr (12/09/22 4098)   nitroGLYCERIN Stopped (12/08/22 2312)   sodium chloride      Assessment: 61yo female was initially admitted 5/23 for CP with negative troponins; this evening she c/o 7/10 CP that was relieved to 3/10 after NTGx3, then 1h later CP was at 9/10 and pt reports pain radiating down LUE, now started on NTG infusion with plan to load with Brilinta and start UFH; troponin in process now.  On 5/25, patient was taken emergently to cath lab after developing chest pain and shortness of breath and received a stent to the LAD. Plan is now for a staged PCI on the RCA once renal function is stable.  Heparin level is therapeutic at 0.33 on 1400 units/hour. No bleeding or issues with infusion reported.  Goal of Therapy:  Heparin level 0.3-0.7 units/ml Monitor platelets by anticoagulation protocol: Yes   Plan:  Continue heparin infusion at 1400 units/hour Check confirmatory heparin level in 6-hours Monitor daily heparin levels,  CBC, and signs/symptoms of bleeding  Thank you for involving pharmacy in this patient's care.   Loralee Pacas, PharmD, BCPS 12/09/2022 5:07 PM  Please check AMION for all [facility] phone numbers After 10:00 PM, call Main Pharmacy [phone]

## 2022-12-10 DIAGNOSIS — G4733 Obstructive sleep apnea (adult) (pediatric): Secondary | ICD-10-CM | POA: Diagnosis not present

## 2022-12-10 DIAGNOSIS — I5030 Unspecified diastolic (congestive) heart failure: Secondary | ICD-10-CM | POA: Diagnosis not present

## 2022-12-10 DIAGNOSIS — N179 Acute kidney failure, unspecified: Secondary | ICD-10-CM | POA: Diagnosis not present

## 2022-12-10 DIAGNOSIS — I200 Unstable angina: Secondary | ICD-10-CM | POA: Diagnosis not present

## 2022-12-10 LAB — GLUCOSE, CAPILLARY
Glucose-Capillary: 137 mg/dL — ABNORMAL HIGH (ref 70–99)
Glucose-Capillary: 123 mg/dL — ABNORMAL HIGH (ref 70–99)
Glucose-Capillary: 103 mg/dL — ABNORMAL HIGH (ref 70–99)
Glucose-Capillary: 145 mg/dL — ABNORMAL HIGH (ref 70–99)

## 2022-12-10 LAB — CBC
HCT: 26.9 % — ABNORMAL LOW (ref 36.0–46.0)
Hemoglobin: 8.4 g/dL — ABNORMAL LOW (ref 12.0–15.0)
MCH: 26.2 pg (ref 26.0–34.0)
MCHC: 31.2 g/dL (ref 30.0–36.0)
MCV: 83.8 fL (ref 80.0–100.0)
Platelets: 211 10*3/uL (ref 150–400)
RBC: 3.21 MIL/uL — ABNORMAL LOW (ref 3.87–5.11)
RDW: 15.2 % (ref 11.5–15.5)
WBC: 21.1 10*3/uL — ABNORMAL HIGH (ref 4.0–10.5)
nRBC: 0 % (ref 0.0–0.2)

## 2022-12-10 LAB — RENAL FUNCTION PANEL
Albumin: 2.7 g/dL — ABNORMAL LOW (ref 3.5–5.0)
Anion gap: 13 (ref 5–15)
BUN: 71 mg/dL — ABNORMAL HIGH (ref 6–20)
CO2: 20 mmol/L — ABNORMAL LOW (ref 22–32)
Calcium: 8.2 mg/dL — ABNORMAL LOW (ref 8.9–10.3)
Chloride: 100 mmol/L (ref 98–111)
Creatinine, Ser: 3.7 mg/dL — ABNORMAL HIGH (ref 0.44–1.00)
GFR, Estimated: 14 mL/min — ABNORMAL LOW (ref >60)
Glucose, Bld: 154 mg/dL — ABNORMAL HIGH (ref 70–99)
Phosphorus: 4.2 mg/dL (ref 2.5–4.6)
Potassium: 4.1 mmol/L (ref 3.5–5.1)
Sodium: 133 mmol/L — ABNORMAL LOW (ref 135–145)

## 2022-12-10 MED ORDER — LIP MEDEX EX OINT
TOPICAL_OINTMENT | CUTANEOUS | Status: DC | PRN
  Filled 2022-12-10: qty 7

## 2022-12-10 MED ORDER — DARBEPOETIN ALFA 150 MCG/0.3ML IJ SOSY
150.0000 ug | PREFILLED_SYRINGE | INTRAMUSCULAR | Status: DC
  Administered 2022-12-10: 150 ug via SUBCUTANEOUS
  Filled 2022-12-10: qty 0.3

## 2022-12-10 MED ORDER — DOCUSATE SODIUM 100 MG PO CAPS
200.0000 mg | ORAL_CAPSULE | Freq: Once | ORAL | Status: AC
  Administered 2022-12-10: 200 mg via ORAL
  Filled 2022-12-10: qty 2

## 2022-12-10 NOTE — Progress Notes (Signed)
PROGRESS NOTE    Dana [name]  XWR:[id number] DOB: 03/07/1961 DOA: 12/06/2022 PCP: Blane Ohara, MD    Brief Narrative:  62 year old with history of coronary artery disease status post multiple stenting, A-fib status post ablation, mitral regurgitation with MitraClip, sleep apnea on CPAP, obesity, cardiac arrest, chronic respiratory failure with hypoxemia, CKD stage IIIb with baseline creatinine about 1.9, anemia, hypertension, hyperlipidemia, gout, GERD, anxiety, depression and diastolic dysfunction presented with midsternal chest pain mostly on the left side transiently improved with nitroglycerin but recurred.  In the emergency room hemodynamically stable.  Creatinine 2.73 from recent baseline of 1.9.  Troponins negative.  EKG nonischemic.  Due to recurrent chest pain, case discussed with cardiology who recommended ischemia evaluation and patient admitted to the hospital.  5/25, overnight with worsening chest pain and shortness of breath.  Taken urgently to cardiac cath and stented LAD.  Developed acute hypoxemia and needed BiPAP overnight. 5/26, some clinical improvement.  Epistaxis on heparin.  Improved after stopping heparin.   Assessment & Plan:   Non-STEMI, Patient with extensive history of coronary artery disease, unstable angina: Patient had worsening chest pain after admission. 5/24 loaded with Brilinta and on nitro drip.  ST changes noted. Taken to cardiac cath emergently Drug-eluting stent proximal LAD to mid LAD lesion that was 90% stenosed. Patient does have other multiple areas of stenosis. Currently on aspirin, Brilinta.   Taken off nitroglycerin drip. Significant epistaxis on heparin, discontinued.  Acute respiratory failure with hypoxemia, respiratory distress: Overnight with fluid overload, acute exacerbation of chronic diastolic congestive heart failure. Patient on oxygen and CPAP at night at home. Needed BiPAP overnight and responded well. Given oxygen to keep  saturation more than 90%.  Some clinical stabilization today.  Acute kidney injury with history of chronic kidney disease stage IIIb: Baseline creatinine about 1.9.  Multifactorial AKI including recent use of Bactrim. Holding antihypertensives as well as torsemide. Creatinine slightly up today.  Potassium normal.  Urine output 400 mL, not measured strictly. Nephrology following.  UTI: Recently diagnosed at urology office.  Cultures not available.  He started on Bactrim and long-term Keflex for prophylaxis. Reculture of the urine, pending Discontinue Bactrim.  Continue Keflex 500 mg 3 times daily for 5 days then she will go back on her chronic suppressive therapy 250 mg daily at bedtime.  Hyperlipidemia, on Zetia and Repatha Patient's hypertension, Coreg and Imdur was continued.  Holding hydralazine and torsemide. A-fib, sinus rhythm post ablation. Mitral regurgitation post MitraClip. Type 2 diabetes, well-controlled.  Remains on long-acting insulin and sliding scale insulin. Sleep apnea, using CPAP.  BiPAP for respiratory distress. Gout, on home allopurinol. GERD, on PPI. Anxiety and depression, on BuSpar and Ativan.   DVT prophylaxis: Heparin infusion   Code Status: Full code Family Communication: None at the bedside today. Disposition Plan: Status is: Inpatient.  Active treatment of non-STEMI, respiratory distress, acute renal failure     Consultants:  Cardiology Nephrology  Procedures:  None  Antimicrobials:  Bactrim, discontinue Keflex 5 mg twice daily 5/24   Subjective:  Patient seen and examined.  Yesterday evening she had significant epistaxis, she also had some coffee-ground emesis after swallowing posterior nasal blood.  Currently denies any complaints other than feeling weak and short of breath on mobility.  Objective: Vitals:   12/10/22 0059 12/10/22 0532 12/10/22 0702 12/10/22 0753  BP: (!) 137/58 (!) 137/58  (!) 134/59  Pulse: 95 81  82  Resp: 20    (!) 22  Temp: 99.5 F (37.5  C) 97.8 F (36.6 C)  97.8 F (36.6 C)  TempSrc: Oral Oral  Oral  SpO2: 95% 99%  96%  Weight:   106.4 kg   Height:        Intake/Output Summary (Last 24 hours) at 12/10/2022 1142 Last data filed at 12/10/2022 1000 Gross per 24 hour  Intake 1786.27 ml  Output 560 ml  Net 1226.27 ml    Filed Weights   12/08/22 0538 12/09/22 0620 12/10/22 0702  Weight: 107.4 kg 105 kg 106.4 kg    Examination:  General exam: Appears comfortable today.  Able to talk in complete sentences. Respiratory system: No added sounds.  Poor inspiratory effort. SpO2: 96 % O2 Flow Rate (L/min): 6 L/min FiO2 (%): 44 %  Cardiovascular system: S1 & S2 heard, RRR.  Chronic nonpitting edema. Gastrointestinal system: Abdomen is nondistended, soft and nontender. No organomegaly or masses felt. Normal bowel sounds heard. Central nervous system: Alert and oriented. No focal neurological deficits. Extremities: Symmetric 5 x 5 power. Skin: No rashes, lesions or ulcers Psychiatry: Judgement and insight appear normal.     Data Reviewed: I have personally reviewed following labs and imaging studies  CBC: Recent Labs  Lab 12/04/22 1133 12/06/22 1045 12/07/22 0253 12/08/22 0159 12/09/22 0618 12/10/22 0231  WBC 11.0* 17.3* 11.8* 17.2* 15.1* 21.1*  NEUTROABS 7.6 14.9*  --   --   --   --   HGB 9.7* 9.3* 9.2* 8.9* 9.7* 8.4*  HCT 31.6* 29.7* 29.5* 28.2* 30.4* 26.9*  MCV 87.8 84.9 83.8 84.9 82.8 83.8  PLT 248 235 221 238 242 211    Basic Metabolic Panel: Recent Labs  Lab 12/06/22 1045 12/07/22 0253 12/08/22 0159 12/09/22 0618 12/10/22 0231  NA 135 135 136 135 133*  K 4.1 4.0 4.1 3.8 4.1  CL 99 100 102 103 100  CO2 23 25 21* 20* 20*  GLUCOSE 213* 156* 184* 162* 154*  BUN 65* 65* 64* 56* 71*  CREATININE 2.73* 2.74* 2.89* 2.75* 3.70*  CALCIUM 8.8* 8.6* 8.4* 8.2* 8.2*  PHOS  --   --   --   --  4.2    GFR: Estimated Creatinine Clearance: 19.5 mL/min (A) (by C-G formula based  on SCr of 3.7 mg/dL (H)). Liver Function Tests: Recent Labs  Lab 12/06/22 1045 12/10/22 0231  AST 17  --   ALT 18  --   ALKPHOS 139*  --   BILITOT 0.5  --   PROT 7.3  --   ALBUMIN 3.2* 2.7*    No results for input(s): "LIPASE", "AMYLASE" in the last 168 hours. No results for input(s): "AMMONIA" in the last 168 hours. Coagulation Profile: No results for input(s): "INR", "PROTIME" in the last 168 hours. Cardiac Enzymes: No results for input(s): "CKTOTAL", "CKMB", "CKMBINDEX", "TROPONINI" in the last 168 hours. BNP (last 3 results) No results for input(s): "PROBNP" in the last 8760 hours. HbA1C: No results for input(s): "HGBA1C" in the last 72 hours. CBG: Recent Labs  Lab 12/09/22 1715 12/09/22 1805 12/09/22 2252 12/10/22 0755 12/10/22 1129  GLUCAP 261* 239* 171* 137* 123*    Lipid Profile: No results for input(s): "CHOL", "HDL", "LDLCALC", "TRIG", "CHOLHDL", "LDLDIRECT" in the last 72 hours. Thyroid Function Tests: No results for input(s): "TSH", "T4TOTAL", "FREET4", "T3FREE", "THYROIDAB" in the last 72 hours. Anemia Panel: No results for input(s): "VITAMINB12", "FOLATE", "FERRITIN", "TIBC", "IRON", "RETICCTPCT" in the last 72 hours. Sepsis Labs: Recent Labs  Lab 12/06/22 1456 12/06/22 1814  LATICACIDVEN 1.4 1.1  No results found for this or any previous visit (from the past 240 hour(s)).       Radiology Studies: ECHOCARDIOGRAM COMPLETE  Result Date: 12/09/2022    ECHOCARDIOGRAM REPORT   Patient Name:   AMAAL DESPRES Date of Exam: 12/09/2022 Medical Rec #:  [id number]      Height:       64.0 in Accession #:    [id number]     Weight:       231.5 lb Date of Birth:  August 31, 1960      BSA:          2.082 m Patient Age:    61 years       BP:           127/47 mmHg Patient Gender: F              HR:           79 bpm. Exam Location:  Inpatient Procedure: 2D Echo, Cardiac Doppler, Color Doppler and Intracardiac            Opacification Agent Indications:    mitral clip  CAD  History:        Patient has prior history of Echocardiogram examinations. CAD;                 Mitral Valve Disease.  Sonographer:    Dondra Prader RVT Referring Phys: [id number] DAVID W HARDING IMPRESSIONS  1. Abnormla septal motion suggestig RV pressure overload . Left ventricular ejection fraction, by estimation, is 50 to 55%. The left ventricle has low normal function. The left ventricle has no regional wall motion abnormalities. There is mild left ventricular hypertrophy. Left ventricular diastolic parameters are indeterminate.  2. Right ventricular systolic function is mildly reduced. The right ventricular size is mildly enlarged.  3. Post mitral clip x 2 with XTW and NTW the latter entangled in chords no obvious SLD noted mean gradient 5 mmHg in diastole at HR of 80 bpm Mild residual MR appears medial to the two clips . The mitral valve has been repaired/replaced. Mild mitral valve regurgitation. No evidence of mitral stenosis.  4. The aortic valve is tricuspid. There is mild calcification of the aortic valve. Aortic valve regurgitation is not visualized. Aortic valve sclerosis is present, with no evidence of aortic valve stenosis.  5. The inferior vena cava is dilated in size with >50% respiratory variability, suggesting right atrial pressure of 8 mmHg. FINDINGS  Left Ventricle: Abnormla septal motion suggestig RV pressure overload. Left ventricular ejection fraction, by estimation, is 50 to 55%. The left ventricle has low normal function. The left ventricle has no regional wall motion abnormalities. The left ventricular internal cavity size was normal in size. There is mild left ventricular hypertrophy. Left ventricular diastolic parameters are indeterminate. Right Ventricle: The right ventricular size is mildly enlarged. No increase in right ventricular wall thickness. Right ventricular systolic function is mildly reduced. Left Atrium: Left atrial size was normal in size. Right Atrium: Right atrial size was  normal in size. Pericardium: Trivial pericardial effusion is present. The pericardial effusion is posterior to the left ventricle. Mitral Valve: Post mitral clip x 2 with XTW and NTW the latter entangled in chords no obvious SLD noted mean gradient 5 mmHg in diastole at HR of 80 bpm Mild residual MR appears medial to the two clips. The mitral valve has been repaired/replaced. Mild mitral valve regurgitation. No evidence of mitral valve stenosis. MV peak gradient, 16.6 mmHg. The mean mitral  valve gradient is 5.5 mmHg. Tricuspid Valve: The tricuspid valve is normal in structure. Tricuspid valve regurgitation is not demonstrated. No evidence of tricuspid stenosis. Aortic Valve: The aortic valve is tricuspid. There is mild calcification of the aortic valve. Aortic valve regurgitation is not visualized. Aortic valve sclerosis is present, with no evidence of aortic valve stenosis. Aortic valve mean gradient measures 2.5 mmHg. Aortic valve peak gradient measures 4.8 mmHg. Aortic valve area, by VTI measures 2.83 cm. Pulmonic Valve: The pulmonic valve was normal in structure. Pulmonic valve regurgitation is trivial. No evidence of pulmonic stenosis. Aorta: The aortic root is normal in size and structure. Venous: The inferior vena cava is dilated in size with greater than 50% respiratory variability, suggesting right atrial pressure of 8 mmHg. IAS/Shunts: No atrial level shunt detected by color flow Doppler.  LEFT VENTRICLE PLAX 2D LVIDd:         4.40 cm   Diastology LVIDs:         3.40 cm   LV e' medial:    5.15 cm/s LV PW:         1.20 cm   LV E/e' medial:  38.4 LV IVS:        1.20 cm   LV e' lateral:   4.33 cm/s LVOT diam:     1.80 cm   LV E/e' lateral: 45.7 LV SV:         58 LV SV Index:   28 LVOT Area:     2.54 cm  RIGHT VENTRICLE            IVC RV Basal diam:  4.30 cm    IVC diam: 2.00 cm RV Mid diam:    3.80 cm RV S prime:     8.11 cm/s TAPSE (M-mode): 1.8 cm LEFT ATRIUM             Index        RIGHT ATRIUM            Index LA diam:        3.60 cm 1.73 cm/m   RA Area:     18.70 cm LA Vol (A2C):   76.0 ml 36.51 ml/m  RA Volume:   56.50 ml  27.14 ml/m LA Vol (A4C):   84.2 ml 40.44 ml/m LA Biplane Vol: 83.6 ml 40.16 ml/m  AORTIC VALVE                    PULMONIC VALVE AV Area (Vmax):    2.47 cm     PV Vmax:       1.16 m/s AV Area (Vmean):   2.45 cm     PV Peak grad:  5.4 mmHg AV Area (VTI):     2.83 cm AV Vmax:           109.30 cm/s AV Vmean:          75.950 cm/s AV VTI:            0.204 m AV Peak Grad:      4.8 mmHg AV Mean Grad:      2.5 mmHg LVOT Vmax:         106.00 cm/s LVOT Vmean:        73.100 cm/s LVOT VTI:          0.226 m LVOT/AV VTI ratio: 1.11  AORTA Ao Root diam: 2.80 cm MITRAL VALVE MV Area (PHT): 2.35 cm     SHUNTS MV Area VTI:  1.13 cm     Systemic VTI:  0.23 m MV Peak grad:  16.6 mmHg    Systemic Diam: 1.80 cm MV Mean grad:  5.5 mmHg MV Vmax:       2.04 m/s MV Vmean:      106.3 cm/s MV Decel Time: 323 msec MV E velocity: 198.00 cm/s MV A velocity: 121.00 cm/s MV E/A ratio:  1.64 Charlton Haws MD Electronically signed by Charlton Haws MD Signature Date/Time: 12/09/2022/3:12:11 PM    Final         Scheduled Meds:  sodium chloride   Intravenous Once   allopurinol  300 mg Oral Daily   aspirin EC  81 mg Oral Daily   busPIRone  5 mg Oral TID   carvedilol  12.5 mg Oral BID   [START ON 12/12/2022] cephALEXin  250 mg Oral QHS   cephALEXin  500 mg Oral Q12H   darbepoetin (ARANESP) injection - NON-DIALYSIS  150 mcg Subcutaneous Q Mon-1800   ezetimibe  10 mg Oral Daily   insulin aspart  0-15 Units Subcutaneous TID WC   insulin aspart  0-5 Units Subcutaneous QHS   insulin detemir  30 Units Subcutaneous Daily   isosorbide mononitrate  60 mg Oral Daily    morphine injection  1 mg Intravenous Once   oxymetazoline  1 spray Each Nare BID   pantoprazole  40 mg Oral Daily   sodium chloride flush  3 mL Intravenous Q12H   sodium chloride flush  3 mL Intravenous Q12H   ticagrelor  90 mg Oral BID    Continuous Infusions:  sodium chloride Stopped (12/09/22 2245)   nitroGLYCERIN Stopped (12/08/22 2311)   promethazine (PHENERGAN) injection (IM or IVPB) Stopped (12/09/22 2245)   sodium chloride       LOS: 3 days    Time spent: 35 minutes    Dorcas Carrow, MD [facility] Pager [phone]

## 2022-12-10 NOTE — Progress Notes (Signed)
[facility] NEPHROLOGY PROGRESS NOTE  Assessment/ Plan: Pt is a 62 y.o. yo female  with CAD status post stent, chronic diastolic CHF, type I DM, HTN, HLD, PAD, OSA on CPAP, PAF status post ablation, PAD, CKD stage IIIb-IV, presented with chest pain, and a NTEMI seen as a consultation for AKI on CKD with the need of cardiac cath.  For CKD, she follows with Dr. Elenore Rota at [facility] for CKD IIIB-IV. Recent baseline creatinine level anywhere between 1.8-2.3 also history of proteinuria in the past however the RAAS blockers was held because of recurrent AKI.   # Acute kidney injury on CKD IIIb-IV, b/l cr 1.8-2.3: Likely hemodynamically mediated due to combination of diuretics use, Bactrim and NSTEMI.  Urinalysis bland.  She is now oliguric. -s/p cardiac cath on 5/25 with minimal use of contrast.  Patient received IV fluid and Lasix afterward per cardiology. -She is now more oliguric and creatinine level increased-  probably seeing true impact of contrast today now 48 hours later There are no absolute indications for dialysis today but we are not out of the woods-  I explained how supportive HD might be needed-  take one day at a time    # NSTEMI with ongoing chest pain: Cardiac cath on 5/25 with severe multivessel disease, in a standard restenosis status post balloon angioplasty.  She is clinically improved and on cardiac medication and anticoagulation per cardiology team.  # Chronic diastolic CHF: no diuretics today please.   # Anemia of CKD: She gets erythropoietin injection with her hematologist. Hb stable but low, redose ESA   # Urinary tract infection: It seems like she was started on Bactrim as a prophylaxis because of frequent UTI.  Discontinues Bactrim and switched to Keflex. Last urine without signs of infection   Subjective:   UOP not well recorded -  at 560 she thinks accurate-  crt up quite a bit today -  just SOB on exertion and some nausea   Objective Vital signs in  last 24 hours: Vitals:   12/10/22 0059 12/10/22 0532 12/10/22 0702 12/10/22 0753  BP: (!) 137/58 (!) 137/58  (!) 134/59  Pulse: 95 81  82  Resp: 20   (!) 22  Temp: 99.5 F (37.5 C) 97.8 F (36.6 C)  97.8 F (36.6 C)  TempSrc: Oral Oral  Oral  SpO2: 95% 99%  96%  Weight:   106.4 kg   Height:       Weight change:   Intake/Output Summary (Last 24 hours) at 12/10/2022 1033 Last data filed at 12/10/2022 1000 Gross per 24 hour  Intake 1786.27 ml  Output 560 ml  Net 1226.27 ml       Labs: RENAL PANEL Recent Labs  Lab 12/06/22 1045 12/07/22 0253 12/08/22 0159 12/09/22 0618 12/10/22 0231  NA 135 135 136 135 133*  K 4.1 4.0 4.1 3.8 4.1  CL 99 100 102 103 100  CO2 23 25 21* 20* 20*  GLUCOSE 213* 156* 184* 162* 154*  BUN 65* 65* 64* 56* 71*  CREATININE 2.73* 2.74* 2.89* 2.75* 3.70*  CALCIUM 8.8* 8.6* 8.4* 8.2* 8.2*  PHOS  --   --   --   --  4.2  ALBUMIN 3.2*  --   --   --  2.7*    Liver Function Tests: Recent Labs  Lab 12/06/22 1045 12/10/22 0231  AST 17  --   ALT 18  --   ALKPHOS 139*  --   BILITOT 0.5  --  PROT 7.3  --   ALBUMIN 3.2* 2.7*   No results for input(s): "LIPASE", "AMYLASE" in the last 168 hours. No results for input(s): "AMMONIA" in the last 168 hours. CBC: Recent Labs    03/29/22 0955 04/25/22 0813 07/19/22 1529 08/07/22 1103 10/02/22 1043 10/02/22 1044 10/30/22 1053 12/06/22 1045 12/07/22 0253 12/08/22 0159 12/09/22 0618 12/10/22 0231  HGB  --    < >  --    < >  --  9.6*   < > 9.3* 9.2* 8.9* 9.7* 8.4*  MCV  --    < >  --    < >  --  89.0   < > 84.9 83.8 84.9 82.8 83.8  VITAMINB12 596  --  345  --  473  --   --   --   --   --   --   --   FOLATE 9.4  --  9.7  --   --  10.6  --   --   --   --   --   --   FERRITIN 141  --  108  --  93  --   --   --   --   --   --   --   TIBC 263  --  262  --  279  --   --   --   --   --   --   --   IRON 52  --  35  --  34  --   --   --   --   --   --   --    < > = values in this interval not  displayed.    Cardiac Enzymes: No results for input(s): "CKTOTAL", "CKMB", "CKMBINDEX", "TROPONINI" in the last 168 hours. CBG: Recent Labs  Lab 12/09/22 1153 12/09/22 1715 12/09/22 1805 12/09/22 2252 12/10/22 0755  GLUCAP 244* 261* 239* 171* 137*    Iron Studies: No results for input(s): "IRON", "TIBC", "TRANSFERRIN", "FERRITIN" in the last 72 hours. Studies/Results: ECHOCARDIOGRAM COMPLETE  Result Date: 12/09/2022    ECHOCARDIOGRAM REPORT   Patient Name:   Dana [name] Date of Exam: 12/09/2022 Medical Rec #:  [id number]      Height:       64.0 in Accession #:    [id number]     Weight:       231.5 lb Date of Birth:  1960-10-11      BSA:          2.082 m Patient Age:    61 years       BP:           127/47 mmHg Patient Gender: F              HR:           79 bpm. Exam Location:  Inpatient Procedure: 2D Echo, Cardiac Doppler, Color Doppler and Intracardiac            Opacification Agent Indications:    mitral clip CAD  History:        Patient has prior history of Echocardiogram examinations. CAD;                 Mitral Valve Disease.  Sonographer:    Dondra Prader RVT Referring Phys: 20 DAVID W HARDING IMPRESSIONS  1. Abnormla septal motion suggestig RV pressure overload . Left ventricular ejection fraction, by estimation, is 50 to 55%. The left ventricle has low normal  function. The left ventricle has no regional wall motion abnormalities. There is mild left ventricular hypertrophy. Left ventricular diastolic parameters are indeterminate.  2. Right ventricular systolic function is mildly reduced. The right ventricular size is mildly enlarged.  3. Post mitral clip x 2 with XTW and NTW the latter entangled in chords no obvious SLD noted mean gradient 5 mmHg in diastole at HR of 80 bpm Mild residual MR appears medial to the two clips . The mitral valve has been repaired/replaced. Mild mitral valve regurgitation. No evidence of mitral stenosis.  4. The aortic valve is tricuspid. There is mild  calcification of the aortic valve. Aortic valve regurgitation is not visualized. Aortic valve sclerosis is present, with no evidence of aortic valve stenosis.  5. The inferior vena cava is dilated in size with >50% respiratory variability, suggesting right atrial pressure of 8 mmHg. FINDINGS  Left Ventricle: Abnormla septal motion suggestig RV pressure overload. Left ventricular ejection fraction, by estimation, is 50 to 55%. The left ventricle has low normal function. The left ventricle has no regional wall motion abnormalities. The left ventricular internal cavity size was normal in size. There is mild left ventricular hypertrophy. Left ventricular diastolic parameters are indeterminate. Right Ventricle: The right ventricular size is mildly enlarged. No increase in right ventricular wall thickness. Right ventricular systolic function is mildly reduced. Left Atrium: Left atrial size was normal in size. Right Atrium: Right atrial size was normal in size. Pericardium: Trivial pericardial effusion is present. The pericardial effusion is posterior to the left ventricle. Mitral Valve: Post mitral clip x 2 with XTW and NTW the latter entangled in chords no obvious SLD noted mean gradient 5 mmHg in diastole at HR of 80 bpm Mild residual MR appears medial to the two clips. The mitral valve has been repaired/replaced. Mild mitral valve regurgitation. No evidence of mitral valve stenosis. MV peak gradient, 16.6 mmHg. The mean mitral valve gradient is 5.5 mmHg. Tricuspid Valve: The tricuspid valve is normal in structure. Tricuspid valve regurgitation is not demonstrated. No evidence of tricuspid stenosis. Aortic Valve: The aortic valve is tricuspid. There is mild calcification of the aortic valve. Aortic valve regurgitation is not visualized. Aortic valve sclerosis is present, with no evidence of aortic valve stenosis. Aortic valve mean gradient measures 2.5 mmHg. Aortic valve peak gradient measures 4.8 mmHg. Aortic valve  area, by VTI measures 2.83 cm. Pulmonic Valve: The pulmonic valve was normal in structure. Pulmonic valve regurgitation is trivial. No evidence of pulmonic stenosis. Aorta: The aortic root is normal in size and structure. Venous: The inferior vena cava is dilated in size with greater than 50% respiratory variability, suggesting right atrial pressure of 8 mmHg. IAS/Shunts: No atrial level shunt detected by color flow Doppler.  LEFT VENTRICLE PLAX 2D LVIDd:         4.40 cm   Diastology LVIDs:         3.40 cm   LV e' medial:    5.15 cm/s LV PW:         1.20 cm   LV E/e' medial:  38.4 LV IVS:        1.20 cm   LV e' lateral:   4.33 cm/s LVOT diam:     1.80 cm   LV E/e' lateral: 45.7 LV SV:         58 LV SV Index:   28 LVOT Area:     2.54 cm  RIGHT VENTRICLE  IVC RV Basal diam:  4.30 cm    IVC diam: 2.00 cm RV Mid diam:    3.80 cm RV S prime:     8.11 cm/s TAPSE (M-mode): 1.8 cm LEFT ATRIUM             Index        RIGHT ATRIUM           Index LA diam:        3.60 cm 1.73 cm/m   RA Area:     18.70 cm LA Vol (A2C):   76.0 ml 36.51 ml/m  RA Volume:   56.50 ml  27.14 ml/m LA Vol (A4C):   84.2 ml 40.44 ml/m LA Biplane Vol: 83.6 ml 40.16 ml/m  AORTIC VALVE                    PULMONIC VALVE AV Area (Vmax):    2.47 cm     PV Vmax:       1.16 m/s AV Area (Vmean):   2.45 cm     PV Peak grad:  5.4 mmHg AV Area (VTI):     2.83 cm AV Vmax:           109.30 cm/s AV Vmean:          75.950 cm/s AV VTI:            0.204 m AV Peak Grad:      4.8 mmHg AV Mean Grad:      2.5 mmHg LVOT Vmax:         106.00 cm/s LVOT Vmean:        73.100 cm/s LVOT VTI:          0.226 m LVOT/AV VTI ratio: 1.11  AORTA Ao Root diam: 2.80 cm MITRAL VALVE MV Area (PHT): 2.35 cm     SHUNTS MV Area VTI:   1.13 cm     Systemic VTI:  0.23 m MV Peak grad:  16.6 mmHg    Systemic Diam: 1.80 cm MV Mean grad:  5.5 mmHg MV Vmax:       2.04 m/s MV Vmean:      106.3 cm/s MV Decel Time: 323 msec MV E velocity: 198.00 cm/s MV A velocity: 121.00 cm/s MV  E/A ratio:  1.64 Charlton Haws MD Electronically signed by Charlton Haws MD Signature Date/Time: 12/09/2022/3:12:11 PM    Final    CARDIAC CATHETERIZATION  Result Date: 12/08/2022   Prox LAD to Mid LAD lesion is 95% stenosed. - In-stent Re-stenosis   Scoring balloon angioplasty was performed using a BALLN SCOREFLEX 2.75X15.=> Post intervention, there is a 15% residual stenosis.   Mid LAD to Dist LAD lesion is 60% stenosed.   Mid Cx stent - patent up to 1st Mrg.- but occluded beyond the 1st Mrg.  1st Mrg-1 lesion is 30% stenosed. 1st Mrg-2 lesion is 60% stenosed.   Mid Cx to Dist Cx lesion is 100% stenosed.   Prox RCA lesion is 85% stenosed.   Dist RCA stent - patent.   ----------------------------   LV end diastolic pressure is moderately elevated.   There is no aortic valve stenosis. POST-CATH DIAGNOSES Severe Multivessel Disease: Likely culprit lesion is 90% ISR are in the proximal and mid portion of the previously based LAD DES => successful scoring balloon angioplasty with 2.75 mm x 15 mm ScoreFlex balloon to high atmospheres = > 90% reduced to ~20% Severe ~85% Ostial-Prox RCA (with sever Cahteter Damping during engagement =  Will need Staged Intervention once Renal Fxn is stable. Known occlusion of LCx after OM1 (stent occlusion).  OM1 has diffuse extensive disease-no change from previous cath.. Mod-Severely Elevated LVEDP ~23 mmHg. RECOMMENDATIONS Will run gentle IVF at 50 mL in our for 12 hours.  I have given her 1 dose of IV Lasix and may require additional dosing on the floor. She will need staged PCI on the RCA at a later date once renal function stable.  At that time depending on renal function may consider IVUS versus OCT evaluation of LAD stents to determine etiology of ISR.Marland Kitchen  Not performed today due to contrast concerns. Bryan Lemma, MD    Medications: Infusions:  sodium chloride Stopped (12/09/22 2245)   nitroGLYCERIN Stopped (12/08/22 2311)   promethazine (PHENERGAN) injection (IM or IVPB)  Stopped (12/09/22 2245)   sodium chloride      Scheduled Medications:  sodium chloride   Intravenous Once   allopurinol  300 mg Oral Daily   aspirin EC  81 mg Oral Daily   busPIRone  5 mg Oral TID   carvedilol  12.5 mg Oral BID   [START ON 12/12/2022] cephALEXin  250 mg Oral QHS   cephALEXin  500 mg Oral Q12H   ezetimibe  10 mg Oral Daily   insulin aspart  0-15 Units Subcutaneous TID WC   insulin aspart  0-5 Units Subcutaneous QHS   insulin detemir  30 Units Subcutaneous Daily   isosorbide mononitrate  60 mg Oral Daily    morphine injection  1 mg Intravenous Once   oxymetazoline  1 spray Each Nare BID   pantoprazole  40 mg Oral Daily   sodium chloride flush  3 mL Intravenous Q12H   sodium chloride flush  3 mL Intravenous Q12H   ticagrelor  90 mg Oral BID    have reviewed scheduled and prn medications.  Physical Exam: General:NAD, comfortable Heart:RRR, s1s2 nl Lungs: Bibasal rhonchi, no wheeze Abdomen:soft, Non-tender, non-distended Extremities: Bilateral leg edema present but also generous size of legs at baseline Neurology: Alert awake and following commands  Carrie Usery A Maliki Gignac 12/10/2022,10:33 AM  LOS: 3 days

## 2022-12-10 NOTE — TOC CM/SW Note (Signed)
Transition of Care [facility]) - Inpatient Brief Assessment   Patient Details  Name: Dana [name] MRN: [id number] Date of Birth: 1961-01-09  [facility] CM/SW Contact:    Harriet Masson, RN Phone Number: 12/10/2022, 12:41 PM   Clinical Narrative: TOC following.    Transition of Care Asessment: Insurance and Status: Insurance coverage has been reviewed Patient has primary care physician: Yes Home environment has been reviewed: safe to dischargge home when medically stable Prior level of function:: independent. Prior/Current Home Services: No current home services   Readmission risk has been reviewed: Yes Transition of care needs: no transition of care needs at this time

## 2022-12-10 NOTE — Progress Notes (Signed)
Pt reported midsternal cp while lying down.  2 SL  NTG administered with relief and EKG done with no acute changes noted.  Cardiology notified.

## 2022-12-10 NOTE — Progress Notes (Signed)
Rounding Note    Patient Name: Dana [name] Date of Encounter: 12/10/2022  [facility] Cardiologist: Arvilla Meres, MD   Subjective   Epistaxis overnight.  +Exertional dyspnea.  No chest pain.   Inpatient Medications    Scheduled Meds:  sodium chloride   Intravenous Once   allopurinol  300 mg Oral Daily   aspirin EC  81 mg Oral Daily   busPIRone  5 mg Oral TID   carvedilol  12.5 mg Oral BID   [START ON 12/12/2022] cephALEXin  250 mg Oral QHS   cephALEXin  500 mg Oral Q12H   ezetimibe  10 mg Oral Daily   insulin aspart  0-15 Units Subcutaneous TID WC   insulin aspart  0-5 Units Subcutaneous QHS   insulin detemir  30 Units Subcutaneous Daily   isosorbide mononitrate  60 mg Oral Daily    morphine injection  1 mg Intravenous Once   oxymetazoline  1 spray Each Nare BID   pantoprazole  40 mg Oral Daily   sodium chloride flush  3 mL Intravenous Q12H   sodium chloride flush  3 mL Intravenous Q12H   ticagrelor  90 mg Oral BID   Continuous Infusions:  sodium chloride Stopped (12/09/22 2245)   nitroGLYCERIN Stopped (12/08/22 2311)   promethazine (PHENERGAN) injection (IM or IVPB) Stopped (12/09/22 2245)   sodium chloride     PRN Meds: sodium chloride, acetaminophen, HYDROcodone-acetaminophen, lip balm, LORazepam, menthol-cetylpyridinium, nitroGLYCERIN, promethazine (PHENERGAN) injection (IM or IVPB), sodium chloride flush   Vital Signs    Vitals:   12/09/22 2345 12/10/22 0059 12/10/22 0532 12/10/22 0753  BP:  (!) 137/58 (!) 137/58 (!) 134/59  Pulse: 94 95 81 82  Resp:  20  (!) 22  Temp:  99.5 F (37.5 C) 97.8 F (36.6 C) 97.8 F (36.6 C)  TempSrc:  Oral Oral Oral  SpO2: 93% 95% 99% 96%  Weight:      Height:        Intake/Output Summary (Last 24 hours) at 12/10/2022 0954 Last data filed at 12/09/2022 2245 Gross per 24 hour  Intake 1786.27 ml  Output 400 ml  Net 1386.27 ml      12/09/2022    6:20 AM 12/08/2022    5:38 AM 12/07/2022    5:00 AM   Last 3 Weights  Weight (lbs) 231 lb 8 oz 236 lb 12.4 oz 235 lb 14.3 oz  Weight (kg) 105.008 kg 107.4 kg 107 kg      Telemetry    Sinus rhythm.  PACs - Personally Reviewed  ECG    Sinus rhythm.  Rate 90 bpm.  Lateral ST depression and TWI  - Personally Reviewed  Physical Exam   VS:  BP (!) 134/59 (BP Location: Right Arm)   Pulse 82   Temp 97.8 F (36.6 C) (Oral)   Resp (!) 22   Ht 5\' 4"  (1.626 m)   Wt 105 kg   SpO2 96%   BMI 39.74 kg/m  , BMI Body mass index is 39.74 kg/m. GENERAL:  Well appearing HEENT: Pupils equal round and reactive, fundi not visualized, oral mucosa unremarkable NECK:  No jugular venous distention, waveform within normal limits, carotid upstroke brisk and symmetric, no bruits, no thyromegaly LUNGS: Bibasilar crackles HEART:  RRR.  PMI not displaced or sustained,S1 and S2 within normal limits, no S3, no S4, no clicks, no rubs, no murmurs ABD:  Flat, positive bowel sounds normal in frequency in pitch, no bruits, no rebound, no  guarding, no midline pulsatile mass, no hepatomegaly, no splenomegaly EXT:  2 plus pulses throughout, no edema, no cyanosis no clubbing SKIN:  No rashes no nodules NEURO:  Cranial nerves II through XII grossly intact, motor grossly intact throughout [facility]:  Cognitively intact, oriented to person place and time   Labs    High Sensitivity Troponin:   Recent Labs  Lab 12/07/22 0620 12/07/22 2258 12/08/22 0159 12/08/22 0802 12/08/22 0931  TROPONINIHS 8 10 628* 2,035* 1,740*     Chemistry Recent Labs  Lab 12/06/22 1045 12/07/22 0253 12/08/22 0159 12/09/22 0618 12/10/22 0231  NA 135   < > 136 135 133*  K 4.1   < > 4.1 3.8 4.1  CL 99   < > 102 103 100  CO2 23   < > 21* 20* 20*  GLUCOSE 213*   < > 184* 162* 154*  BUN 65*   < > 64* 56* 71*  CREATININE 2.73*   < > 2.89* 2.75* 3.70*  CALCIUM 8.8*   < > 8.4* 8.2* 8.2*  PROT 7.3  --   --   --   --   ALBUMIN 3.2*  --   --   --  2.7*  AST 17  --   --   --   --   ALT 18   --   --   --   --   ALKPHOS 139*  --   --   --   --   BILITOT 0.5  --   --   --   --   GFRNONAA 19*   < > 18* 19* 14*  ANIONGAP 13   < > 13 12 13    < > = values in this interval not displayed.    Lipids No results for input(s): "CHOL", "TRIG", "HDL", "LABVLDL", "LDLCALC", "CHOLHDL" in the last 168 hours.  Hematology Recent Labs  Lab 12/08/22 0159 12/09/22 0618 12/10/22 0231  WBC 17.2* 15.1* 21.1*  RBC 3.32* 3.67* 3.21*  HGB 8.9* 9.7* 8.4*  HCT 28.2* 30.4* 26.9*  MCV 84.9 82.8 83.8  MCH 26.8 26.4 26.2  MCHC 31.6 31.9 31.2  RDW 15.2 15.5 15.2  PLT 238 242 211   Thyroid No results for input(s): "TSH", "FREET4" in the last 168 hours.  BNP Recent Labs  Lab 12/06/22 1814 12/08/22 0159  BNP 143.5* 149.2*    DDimer No results for input(s): "DDIMER" in the last 168 hours.   Radiology    ECHOCARDIOGRAM COMPLETE  Result Date: 12/09/2022    ECHOCARDIOGRAM REPORT   Patient Name:   Dana [name] Date of Exam: 12/09/2022 Medical Rec #:  [id number]      Height:       64.0 in Accession #:    [id number]     Weight:       231.5 lb Date of Birth:  06-20-1961      BSA:          2.082 m Patient Age:    61 years       BP:           127/47 mmHg Patient Gender: F              HR:           79 bpm. Exam Location:  Inpatient Procedure: 2D Echo, Cardiac Doppler, Color Doppler and Intracardiac            Opacification Agent Indications:    mitral clip CAD  History:  Patient has prior history of Echocardiogram examinations. CAD;                 Mitral Valve Disease.  Sonographer:    Dondra Prader RVT Referring Phys: 63 DAVID W HARDING IMPRESSIONS  1. Abnormla septal motion suggestig RV pressure overload . Left ventricular ejection fraction, by estimation, is 50 to 55%. The left ventricle has low normal function. The left ventricle has no regional wall motion abnormalities. There is mild left ventricular hypertrophy. Left ventricular diastolic parameters are indeterminate.  2. Right ventricular systolic  function is mildly reduced. The right ventricular size is mildly enlarged.  3. Post mitral clip x 2 with XTW and NTW the latter entangled in chords no obvious SLD noted mean gradient 5 mmHg in diastole at HR of 80 bpm Mild residual MR appears medial to the two clips . The mitral valve has been repaired/replaced. Mild mitral valve regurgitation. No evidence of mitral stenosis.  4. The aortic valve is tricuspid. There is mild calcification of the aortic valve. Aortic valve regurgitation is not visualized. Aortic valve sclerosis is present, with no evidence of aortic valve stenosis.  5. The inferior vena cava is dilated in size with >50% respiratory variability, suggesting right atrial pressure of 8 mmHg. FINDINGS  Left Ventricle: Abnormla septal motion suggestig RV pressure overload. Left ventricular ejection fraction, by estimation, is 50 to 55%. The left ventricle has low normal function. The left ventricle has no regional wall motion abnormalities. The left ventricular internal cavity size was normal in size. There is mild left ventricular hypertrophy. Left ventricular diastolic parameters are indeterminate. Right Ventricle: The right ventricular size is mildly enlarged. No increase in right ventricular wall thickness. Right ventricular systolic function is mildly reduced. Left Atrium: Left atrial size was normal in size. Right Atrium: Right atrial size was normal in size. Pericardium: Trivial pericardial effusion is present. The pericardial effusion is posterior to the left ventricle. Mitral Valve: Post mitral clip x 2 with XTW and NTW the latter entangled in chords no obvious SLD noted mean gradient 5 mmHg in diastole at HR of 80 bpm Mild residual MR appears medial to the two clips. The mitral valve has been repaired/replaced. Mild mitral valve regurgitation. No evidence of mitral valve stenosis. MV peak gradient, 16.6 mmHg. The mean mitral valve gradient is 5.5 mmHg. Tricuspid Valve: The tricuspid valve is  normal in structure. Tricuspid valve regurgitation is not demonstrated. No evidence of tricuspid stenosis. Aortic Valve: The aortic valve is tricuspid. There is mild calcification of the aortic valve. Aortic valve regurgitation is not visualized. Aortic valve sclerosis is present, with no evidence of aortic valve stenosis. Aortic valve mean gradient measures 2.5 mmHg. Aortic valve peak gradient measures 4.8 mmHg. Aortic valve area, by VTI measures 2.83 cm. Pulmonic Valve: The pulmonic valve was normal in structure. Pulmonic valve regurgitation is trivial. No evidence of pulmonic stenosis. Aorta: The aortic root is normal in size and structure. Venous: The inferior vena cava is dilated in size with greater than 50% respiratory variability, suggesting right atrial pressure of 8 mmHg. IAS/Shunts: No atrial level shunt detected by color flow Doppler.  LEFT VENTRICLE PLAX 2D LVIDd:         4.40 cm   Diastology LVIDs:         3.40 cm   LV e' medial:    5.15 cm/s LV PW:         1.20 cm   LV E/e' medial:  38.4 LV IVS:  1.20 cm   LV e' lateral:   4.33 cm/s LVOT diam:     1.80 cm   LV E/e' lateral: 45.7 LV SV:         58 LV SV Index:   28 LVOT Area:     2.54 cm  RIGHT VENTRICLE            IVC RV Basal diam:  4.30 cm    IVC diam: 2.00 cm RV Mid diam:    3.80 cm RV S prime:     8.11 cm/s TAPSE (M-mode): 1.8 cm LEFT ATRIUM             Index        RIGHT ATRIUM           Index LA diam:        3.60 cm 1.73 cm/m   RA Area:     18.70 cm LA Vol (A2C):   76.0 ml 36.51 ml/m  RA Volume:   56.50 ml  27.14 ml/m LA Vol (A4C):   84.2 ml 40.44 ml/m LA Biplane Vol: 83.6 ml 40.16 ml/m  AORTIC VALVE                    PULMONIC VALVE AV Area (Vmax):    2.47 cm     PV Vmax:       1.16 m/s AV Area (Vmean):   2.45 cm     PV Peak grad:  5.4 mmHg AV Area (VTI):     2.83 cm AV Vmax:           109.30 cm/s AV Vmean:          75.950 cm/s AV VTI:            0.204 m AV Peak Grad:      4.8 mmHg AV Mean Grad:      2.5 mmHg LVOT Vmax:          106.00 cm/s LVOT Vmean:        73.100 cm/s LVOT VTI:          0.226 m LVOT/AV VTI ratio: 1.11  AORTA Ao Root diam: 2.80 cm MITRAL VALVE MV Area (PHT): 2.35 cm     SHUNTS MV Area VTI:   1.13 cm     Systemic VTI:  0.23 m MV Peak grad:  16.6 mmHg    Systemic Diam: 1.80 cm MV Mean grad:  5.5 mmHg MV Vmax:       2.04 m/s MV Vmean:      106.3 cm/s MV Decel Time: 323 msec MV E velocity: 198.00 cm/s MV A velocity: 121.00 cm/s MV E/A ratio:  1.64 Charlton Haws MD Electronically signed by Charlton Haws MD Signature Date/Time: 12/09/2022/3:12:11 PM    Final    CARDIAC CATHETERIZATION  Result Date: 12/08/2022   Prox LAD to Mid LAD lesion is 95% stenosed. - In-stent Re-stenosis   Scoring balloon angioplasty was performed using a BALLN SCOREFLEX 2.75X15.=> Post intervention, there is a 15% residual stenosis.   Mid LAD to Dist LAD lesion is 60% stenosed.   Mid Cx stent - patent up to 1st Mrg.- but occluded beyond the 1st Mrg.  1st Mrg-1 lesion is 30% stenosed. 1st Mrg-2 lesion is 60% stenosed.   Mid Cx to Dist Cx lesion is 100% stenosed.   Prox RCA lesion is 85% stenosed.   Dist RCA stent - patent.   ----------------------------   LV end diastolic pressure is moderately elevated.  There is no aortic valve stenosis. POST-CATH DIAGNOSES Severe Multivessel Disease: Likely culprit lesion is 90% ISR are in the proximal and mid portion of the previously based LAD DES => successful scoring balloon angioplasty with 2.75 mm x 15 mm ScoreFlex balloon to high atmospheres = > 90% reduced to ~20% Severe ~85% Ostial-Prox RCA (with sever Cahteter Damping during engagement = Will need Staged Intervention once Renal Fxn is stable. Known occlusion of LCx after OM1 (stent occlusion).  OM1 has diffuse extensive disease-no change from previous cath.. Mod-Severely Elevated LVEDP ~23 mmHg. RECOMMENDATIONS Will run gentle IVF at 50 mL in our for 12 hours.  I have given her 1 dose of IV Lasix and may require additional dosing on the floor. She will  need staged PCI on the RCA at a later date once renal function stable.  At that time depending on renal function may consider IVUS versus OCT evaluation of LAD stents to determine etiology of ISR.Marland Kitchen  Not performed today due to contrast concerns. Bryan Lemma, MD    Cardiac Studies   Echo 12/09/22: IMPRESSIONS     1. Abnormla septal motion suggestig RV pressure overload . Left  ventricular ejection fraction, by estimation, is 50 to 55%. The left  ventricle has low normal function. The left ventricle has no regional wall  motion abnormalities. There is mild left  ventricular hypertrophy. Left ventricular diastolic parameters are  indeterminate.   2. Right ventricular systolic function is mildly reduced. The right  ventricular size is mildly enlarged.   3. Post mitral clip x 2 with XTW and NTW the latter entangled in chords  no obvious SLD noted mean gradient 5 mmHg in diastole at HR of 80 bpm Mild  residual MR appears medial to the two clips . The mitral valve has been  repaired/replaced. Mild mitral  valve regurgitation. No evidence of mitral stenosis.   4. The aortic valve is tricuspid. There is mild calcification of the  aortic valve. Aortic valve regurgitation is not visualized. Aortic valve  sclerosis is present, with no evidence of aortic valve stenosis.   5. The inferior vena cava is dilated in size with >50% respiratory  variability, suggesting right atrial pressure of 8 mmHg.   LHC 12/08/22: Diagnostic Dominance: Right  Intervention   Patient Profile     62 y.o. female with HFpEF, CAD s/p PCI, PAF s/p ablation, mitral regurgitation s/p mitraclip, CKD 3b,DM, HTN, HL, PAD and OSA on CPAP admited with NSTEMI.   Assessment & Plan    # NSTEMI: # CAD:  # HL:  HS troponin peaked at 2035.  Patient with multiple prior PCI.  Underwent emergent LAD intervention 12/08/22.  She also has severe (85%) ostial proximal RCA stenosis.  Will need staged PCI once renal function improves.   However, renal function is worsening.  She also has known occlusion of LCX after OM1 and diffuse extensive disease in OM1.  Continue aspirin, carvedilol, ticagrelor, and Imdur.  LDL was 66 08/2022.  Given her extensive disease, would aim for <55.  She is statin intolerant.  Continue Repatha and Zetia.  # Epistaxis: # ?Coffee ground emesis: # Acute on chronic anemia: Hgb this am down to 8.4 from 9.7 yesterday.  Heparin on hold and the epistaxis has stopped.  Continue PPI, but more likely that the bleeding is only coming from her nose.   # Acute on chronic renal failure: Worsening renal function this morning.She hasn't received any lasix since 5/25.  UOP is poor despite receiving  lasix 80mg  IV yesteray and renal function worsening.  SHe has persistent crackles on exam.  Nephrology following.   # Acute on chronic HFpEF:  # HTN:  BP stable on carvedilol and Imdur.  Will not be more aggressive with BP due to low diastolic BP and extensive CAD.  LVEDP was 23 mmHg at cath on 5/25.   # PAF:  S/p ablation over 10 years ago.  No longer on anticoagulation.  # s/p Mitra-Clip: Mild MR.  Mean gradient 5 mmHg on echo yesterday.       For questions or updates, please contact [facility] Please consult [email or web address] for contact info under        Signed, Chilton Si, MD  12/10/2022, 9:54 AM

## 2022-12-11 ENCOUNTER — Inpatient Hospital Stay (HOSPITAL_COMMUNITY): Payer: PPO

## 2022-12-11 ENCOUNTER — Encounter (HOSPITAL_COMMUNITY): Payer: Self-pay | Admitting: Cardiology

## 2022-12-11 DIAGNOSIS — N170 Acute kidney failure with tubular necrosis: Secondary | ICD-10-CM

## 2022-12-11 DIAGNOSIS — I5030 Unspecified diastolic (congestive) heart failure: Secondary | ICD-10-CM | POA: Diagnosis not present

## 2022-12-11 DIAGNOSIS — I200 Unstable angina: Secondary | ICD-10-CM | POA: Diagnosis not present

## 2022-12-11 DIAGNOSIS — I214 Non-ST elevation (NSTEMI) myocardial infarction: Secondary | ICD-10-CM

## 2022-12-11 DIAGNOSIS — G4733 Obstructive sleep apnea (adult) (pediatric): Secondary | ICD-10-CM | POA: Diagnosis not present

## 2022-12-11 DIAGNOSIS — I130 Hypertensive heart and chronic kidney disease with heart failure and stage 1 through stage 4 chronic kidney disease, or unspecified chronic kidney disease: Secondary | ICD-10-CM | POA: Diagnosis not present

## 2022-12-11 DIAGNOSIS — Z9889 Other specified postprocedural states: Secondary | ICD-10-CM | POA: Diagnosis not present

## 2022-12-11 LAB — URINALYSIS, W/ REFLEX TO CULTURE (INFECTION SUSPECTED)
Bilirubin Urine: NEGATIVE
Glucose, UA: NEGATIVE mg/dL
Hgb urine dipstick: NEGATIVE
Ketones, ur: NEGATIVE mg/dL
Leukocytes,Ua: NEGATIVE
Nitrite: NEGATIVE
Protein, ur: NEGATIVE mg/dL
Specific Gravity, Urine: 1.009 (ref 1.005–1.030)
pH: 5 (ref 5.0–8.0)

## 2022-12-11 LAB — IRON AND TIBC
Iron: 15 ug/dL — ABNORMAL LOW (ref 28–170)
Saturation Ratios: 7 % — ABNORMAL LOW (ref 10.4–31.8)
TIBC: 213 ug/dL — ABNORMAL LOW (ref 250–450)
UIBC: 198 ug/dL

## 2022-12-11 LAB — CBC
HCT: 24.7 % — ABNORMAL LOW (ref 36.0–46.0)
Hemoglobin: 7.9 g/dL — ABNORMAL LOW (ref 12.0–15.0)
MCH: 26.5 pg (ref 26.0–34.0)
MCHC: 32 g/dL (ref 30.0–36.0)
MCV: 82.9 fL (ref 80.0–100.0)
Platelets: 194 10*3/uL (ref 150–400)
RBC: 2.98 MIL/uL — ABNORMAL LOW (ref 3.87–5.11)
RDW: 15.3 % (ref 11.5–15.5)
WBC: 13.4 10*3/uL — ABNORMAL HIGH (ref 4.0–10.5)
nRBC: 0 % (ref 0.0–0.2)

## 2022-12-11 LAB — RENAL FUNCTION PANEL
Albumin: 2.6 g/dL — ABNORMAL LOW (ref 3.5–5.0)
Anion gap: 13 (ref 5–15)
BUN: 79 mg/dL — ABNORMAL HIGH (ref 6–20)
CO2: 20 mmol/L — ABNORMAL LOW (ref 22–32)
Calcium: 8.1 mg/dL — ABNORMAL LOW (ref 8.9–10.3)
Chloride: 100 mmol/L (ref 98–111)
Creatinine, Ser: 4.25 mg/dL — ABNORMAL HIGH (ref 0.44–1.00)
GFR, Estimated: 11 mL/min — ABNORMAL LOW (ref >60)
Glucose, Bld: 109 mg/dL — ABNORMAL HIGH (ref 70–99)
Phosphorus: 4.7 mg/dL — ABNORMAL HIGH (ref 2.5–4.6)
Potassium: 3.8 mmol/L (ref 3.5–5.1)
Sodium: 133 mmol/L — ABNORMAL LOW (ref 135–145)

## 2022-12-11 LAB — GLUCOSE, CAPILLARY
Glucose-Capillary: 101 mg/dL — ABNORMAL HIGH (ref 70–99)
Glucose-Capillary: 129 mg/dL — ABNORMAL HIGH (ref 70–99)
Glucose-Capillary: 166 mg/dL — ABNORMAL HIGH (ref 70–99)
Glucose-Capillary: 176 mg/dL — ABNORMAL HIGH (ref 70–99)

## 2022-12-11 LAB — HEPATITIS B SURFACE ANTIGEN: Hepatitis B Surface Ag: NONREACTIVE

## 2022-12-11 LAB — POCT ACTIVATED CLOTTING TIME: Activated Clotting Time: 185 seconds

## 2022-12-11 MED ORDER — CHLORHEXIDINE GLUCONATE CLOTH 2 % EX PADS
6.0000 | MEDICATED_PAD | Freq: Every day | CUTANEOUS | Status: DC
  Administered 2022-12-11: 6 via TOPICAL

## 2022-12-11 MED ORDER — POLYETHYLENE GLYCOL 3350 17 G PO PACK
17.0000 g | PACK | Freq: Every day | ORAL | Status: DC
  Administered 2022-12-11 – 2022-12-16 (×6): 17 g via ORAL
  Filled 2022-12-11 (×6): qty 1

## 2022-12-11 MED ORDER — FUROSEMIDE 10 MG/ML IJ SOLN
80.0000 mg | Freq: Two times a day (BID) | INTRAMUSCULAR | Status: DC
  Administered 2022-12-11 – 2022-12-13 (×5): 80 mg via INTRAVENOUS
  Filled 2022-12-11 (×5): qty 8

## 2022-12-11 MED ORDER — LIDOCAINE-EPINEPHRINE 1 %-1:100000 IJ SOLN
INTRAMUSCULAR | Status: AC
  Filled 2022-12-11: qty 1

## 2022-12-11 MED ORDER — MIDAZOLAM HCL 2 MG/2ML IJ SOLN
INTRAMUSCULAR | Status: AC
  Filled 2022-12-11: qty 2

## 2022-12-11 MED ORDER — HEPARIN SODIUM (PORCINE) 1000 UNIT/ML IJ SOLN
INTRAMUSCULAR | Status: AC
  Filled 2022-12-11: qty 10

## 2022-12-11 NOTE — Progress Notes (Signed)
Heart Failure Navigator Progress Note  Assessed for Heart & Vascular TOC clinic readiness.  Patient does not meet criteria due to Advanced Heart Failure Team patient.   Navigator will sign off at this time.    Henery Betzold, BSN, RN Heart Failure Nurse Navigator Secure Chat Only   

## 2022-12-11 NOTE — Progress Notes (Signed)
I spoke to pts daughter-  there is some confusion regarding the messaging to the patient from myself and cardiology.  Patient has made more urine so wants to see if that is the start of her kidneys trying to improve and wants to see if she can be medically managed from a kidney and heart standpoint and improve.  Will put HD cath on hold for now but continue to follow closely.  Patient is willing to do HD however in the future if she needs   Cecille Aver

## 2022-12-11 NOTE — Progress Notes (Addendum)
Rounding Note    Patient Name: Dana [name] Date of Encounter: 12/11/2022  [facility] Cardiologist: Arvilla Meres, MD   Subjective   No chest pain overnight, some heaviness with position changes.   Inpatient Medications    Scheduled Meds:  allopurinol  300 mg Oral Daily   aspirin EC  81 mg Oral Daily   busPIRone  5 mg Oral TID   carvedilol  12.5 mg Oral BID   [START ON 12/12/2022] cephALEXin  250 mg Oral QHS   cephALEXin  500 mg Oral Q12H   darbepoetin (ARANESP) injection - NON-DIALYSIS  150 mcg Subcutaneous Q Mon-1800   ezetimibe  10 mg Oral Daily   insulin aspart  0-15 Units Subcutaneous TID WC   insulin aspart  0-5 Units Subcutaneous QHS   insulin detemir  30 Units Subcutaneous Daily   isosorbide mononitrate  60 mg Oral Daily    morphine injection  1 mg Intravenous Once   oxymetazoline  1 spray Each Nare BID   pantoprazole  40 mg Oral Daily   sodium chloride flush  3 mL Intravenous Q12H   sodium chloride flush  3 mL Intravenous Q12H   ticagrelor  90 mg Oral BID   Continuous Infusions:  sodium chloride Stopped (12/09/22 2245)   nitroGLYCERIN Stopped (12/08/22 2311)   promethazine (PHENERGAN) injection (IM or IVPB) Stopped (12/10/22 1448)   sodium chloride     PRN Meds: sodium chloride, acetaminophen, HYDROcodone-acetaminophen, lip balm, LORazepam, menthol-cetylpyridinium, nitroGLYCERIN, promethazine (PHENERGAN) injection (IM or IVPB), sodium chloride flush   Vital Signs    Vitals:   12/10/22 2328 12/11/22 0009 12/11/22 0436 12/11/22 0806  BP:  (!) 130/47 (!) 139/54 (!) 140/59  Pulse: 81 77 73 78  Resp: 16 17 18 16   Temp:  98.5 F (36.9 C) 98 F (36.7 C) 98.3 F (36.8 C)  TempSrc:  Oral Oral Oral  SpO2: 93% 94% 95% 92%  Weight:   108.5 kg   Height:        Intake/Output Summary (Last 24 hours) at 12/11/2022 0859 Last data filed at 12/11/2022 0300 Gross per 24 hour  Intake 1463.81 ml  Output 285 ml  Net 1178.81 ml      12/11/2022     4:36 AM 12/10/2022    7:02 AM 12/09/2022    6:20 AM  Last 3 Weights  Weight (lbs) 239 lb 3.2 oz 234 lb 9.6 oz 231 lb 8 oz  Weight (kg) 108.5 kg 106.414 kg 105.008 kg      Telemetry    Sinus Rhythm - Personally Reviewed  ECG    No new tracing this morning - Personally Reviewed  Physical Exam   GEN: No acute distress.   Neck: difficult to assess JVD Cardiac: RRR, no murmurs, rubs, or gallops.  Respiratory: Diminished in bases GI: Soft, nontender, non-distended  MS: 1-2+ bilateral LE edema; No deformity. Neuro:  Nonfocal  Psych: Normal affect   Labs    High Sensitivity Troponin:   Recent Labs  Lab 12/07/22 0620 12/07/22 2258 12/08/22 0159 12/08/22 0802 12/08/22 0931  TROPONINIHS 8 10 628* 2,035* 1,740*     Chemistry Recent Labs  Lab 12/06/22 1045 12/07/22 0253 12/09/22 0618 12/10/22 0231 12/11/22 0112  NA 135   < > 135 133* 133*  K 4.1   < > 3.8 4.1 3.8  CL 99   < > 103 100 100  CO2 23   < > 20* 20* 20*  GLUCOSE 213*   < >  162* 154* 109*  BUN 65*   < > 56* 71* 79*  CREATININE 2.73*   < > 2.75* 3.70* 4.25*  CALCIUM 8.8*   < > 8.2* 8.2* 8.1*  PROT 7.3  --   --   --   --   ALBUMIN 3.2*  --   --  2.7* 2.6*  AST 17  --   --   --   --   ALT 18  --   --   --   --   ALKPHOS 139*  --   --   --   --   BILITOT 0.5  --   --   --   --   GFRNONAA 19*   < > 19* 14* 11*  ANIONGAP 13   < > 12 13 13    < > = values in this interval not displayed.    Lipids No results for input(s): "CHOL", "TRIG", "HDL", "LABVLDL", "LDLCALC", "CHOLHDL" in the last 168 hours.  Hematology Recent Labs  Lab 12/09/22 0618 12/10/22 0231 12/11/22 0112  WBC 15.1* 21.1* 13.4*  RBC 3.67* 3.21* 2.98*  HGB 9.7* 8.4* 7.9*  HCT 30.4* 26.9* 24.7*  MCV 82.8 83.8 82.9  MCH 26.4 26.2 26.5  MCHC 31.9 31.2 32.0  RDW 15.5 15.2 15.3  PLT 242 211 194   Thyroid No results for input(s): "TSH", "FREET4" in the last 168 hours.  BNP Recent Labs  Lab 12/06/22 1814 12/08/22 0159  BNP 143.5* 149.2*     DDimer No results for input(s): "DDIMER" in the last 168 hours.   Radiology    ECHOCARDIOGRAM COMPLETE  Result Date: 12/09/2022    ECHOCARDIOGRAM REPORT   Patient Name:   Dana [name] Date of Exam: 12/09/2022 Medical Rec #:  [id number]      Height:       64.0 in Accession #:    [id number]     Weight:       231.5 lb Date of Birth:  01/25/1961      BSA:          2.082 m Patient Age:    61 years       BP:           127/47 mmHg Patient Gender: F              HR:           79 bpm. Exam Location:  Inpatient Procedure: 2D Echo, Cardiac Doppler, Color Doppler and Intracardiac            Opacification Agent Indications:    mitral clip CAD  History:        Patient has prior history of Echocardiogram examinations. CAD;                 Mitral Valve Disease.  Sonographer:    Dondra Prader RVT Referring Phys: [id number] Hayde Kilgour W Mellie Buccellato IMPRESSIONS  1. Abnormla septal motion suggestig RV pressure overload . Left ventricular ejection fraction, by estimation, is 50 to 55%. The left ventricle has low normal function. The left ventricle has no regional wall motion abnormalities. There is mild left ventricular hypertrophy. Left ventricular diastolic parameters are indeterminate.  2. Right ventricular systolic function is mildly reduced. The right ventricular size is mildly enlarged.  3. Post mitral clip x 2 with XTW and NTW the latter entangled in chords no obvious SLD noted mean gradient 5 mmHg in diastole at HR of 80 bpm Mild residual MR appears medial to the  two clips . The mitral valve has been repaired/replaced. Mild mitral valve regurgitation. No evidence of mitral stenosis.  4. The aortic valve is tricuspid. There is mild calcification of the aortic valve. Aortic valve regurgitation is not visualized. Aortic valve sclerosis is present, with no evidence of aortic valve stenosis.  5. The inferior vena cava is dilated in size with >50% respiratory variability, suggesting right atrial pressure of 8 mmHg. FINDINGS  Left Ventricle:  Abnormla septal motion suggestig RV pressure overload. Left ventricular ejection fraction, by estimation, is 50 to 55%. The left ventricle has low normal function. The left ventricle has no regional wall motion abnormalities. The left ventricular internal cavity size was normal in size. There is mild left ventricular hypertrophy. Left ventricular diastolic parameters are indeterminate. Right Ventricle: The right ventricular size is mildly enlarged. No increase in right ventricular wall thickness. Right ventricular systolic function is mildly reduced. Left Atrium: Left atrial size was normal in size. Right Atrium: Right atrial size was normal in size. Pericardium: Trivial pericardial effusion is present. The pericardial effusion is posterior to the left ventricle. Mitral Valve: Post mitral clip x 2 with XTW and NTW the latter entangled in chords no obvious SLD noted mean gradient 5 mmHg in diastole at HR of 80 bpm Mild residual MR appears medial to the two clips. The mitral valve has been repaired/replaced. Mild mitral valve regurgitation. No evidence of mitral valve stenosis. MV peak gradient, 16.6 mmHg. The mean mitral valve gradient is 5.5 mmHg. Tricuspid Valve: The tricuspid valve is normal in structure. Tricuspid valve regurgitation is not demonstrated. No evidence of tricuspid stenosis. Aortic Valve: The aortic valve is tricuspid. There is mild calcification of the aortic valve. Aortic valve regurgitation is not visualized. Aortic valve sclerosis is present, with no evidence of aortic valve stenosis. Aortic valve mean gradient measures 2.5 mmHg. Aortic valve peak gradient measures 4.8 mmHg. Aortic valve area, by VTI measures 2.83 cm. Pulmonic Valve: The pulmonic valve was normal in structure. Pulmonic valve regurgitation is trivial. No evidence of pulmonic stenosis. Aorta: The aortic root is normal in size and structure. Venous: The inferior vena cava is dilated in size with greater than 50% respiratory  variability, suggesting right atrial pressure of 8 mmHg. IAS/Shunts: No atrial level shunt detected by color flow Doppler.  LEFT VENTRICLE PLAX 2D LVIDd:         4.40 cm   Diastology LVIDs:         3.40 cm   LV e' medial:    5.15 cm/s LV PW:         1.20 cm   LV E/e' medial:  38.4 LV IVS:        1.20 cm   LV e' lateral:   4.33 cm/s LVOT diam:     1.80 cm   LV E/e' lateral: 45.7 LV SV:         58 LV SV Index:   28 LVOT Area:     2.54 cm  RIGHT VENTRICLE            IVC RV Basal diam:  4.30 cm    IVC diam: 2.00 cm RV Mid diam:    3.80 cm RV S prime:     8.11 cm/s TAPSE (M-mode): 1.8 cm LEFT ATRIUM             Index        RIGHT ATRIUM           Index LA diam:  3.60 cm 1.73 cm/m   RA Area:     18.70 cm LA Vol (A2C):   76.0 ml 36.51 ml/m  RA Volume:   56.50 ml  27.14 ml/m LA Vol (A4C):   84.2 ml 40.44 ml/m LA Biplane Vol: 83.6 ml 40.16 ml/m  AORTIC VALVE                    PULMONIC VALVE AV Area (Vmax):    2.47 cm     PV Vmax:       1.16 m/s AV Area (Vmean):   2.45 cm     PV Peak grad:  5.4 mmHg AV Area (VTI):     2.83 cm AV Vmax:           109.30 cm/s AV Vmean:          75.950 cm/s AV VTI:            0.204 m AV Peak Grad:      4.8 mmHg AV Mean Grad:      2.5 mmHg LVOT Vmax:         106.00 cm/s LVOT Vmean:        73.100 cm/s LVOT VTI:          0.226 m LVOT/AV VTI ratio: 1.11  AORTA Ao Root diam: 2.80 cm MITRAL VALVE MV Area (PHT): 2.35 cm     SHUNTS MV Area VTI:   1.13 cm     Systemic VTI:  0.23 m MV Peak grad:  16.6 mmHg    Systemic Diam: 1.80 cm MV Mean grad:  5.5 mmHg MV Vmax:       2.04 m/s MV Vmean:      106.3 cm/s MV Decel Time: 323 msec MV E velocity: 198.00 cm/s MV A velocity: 121.00 cm/s MV E/A ratio:  1.64 Charlton Haws MD Electronically signed by Charlton Haws MD Signature Date/Time: 12/09/2022/3:12:11 PM    Final     Cardiac Studies   Cath: 12/08/2022  POST-CATH DIAGNOSES Severe Multivessel Disease: Likely culprit lesion is 90% ISR are in the proximal and mid portion of the previously  based LAD DES => successful scoring balloon angioplasty with 2.75 mm x 15 mm ScoreFlex balloon to high atmospheres = > 90% reduced to ~20% Severe ~85% Ostial-Prox RCA (with sever Cahteter Damping during engagement = Will need Staged Intervention once Renal Fxn is stable.  Known occlusion of LCx after OM1 (stent occlusion).  OM1 has diffuse extensive disease-no change from previous cath.. Mod-Severely Elevated LVEDP ~23 mmHg.      RECOMMENDATIONS Will run gentle IVF at 50 mL in our for 12 hours.  I have given her 1 dose of IV Lasix and may require additional dosing on the floor. She will need staged PCI on the RCA at a later date once renal function stable.  At that time depending on renal function may consider IVUS versus OCT evaluation of LAD stents to determine etiology of ISR.Marland Kitchen  Not performed today due to contrast concerns.  Bryan Lemma, MD   Diagnostic Dominance: Right     Intervention       Echo: 12/09/2022:    1. Abnormla septal motion suggestig RV pressure overload . Left ventricular ejection fraction, by estimation, is 50 to 55%. The left ventricle has low normal function. The left ventricle has no regional wall motion abnormalities. There is mild left ventricular hypertrophy. Left ventricular diastolic parameters are indeterminate.   2. Right ventricular systolic function is mildly reduced. The  right ventricular size is mildly enlarged.   3. Post mitral clip x 2 with XTW and NTW the latter entangled in chords no obvious SLD noted mean gradient 5 mmHg in diastole at HR of 80 bpm Mild residual MR appears medial to the two clips . The mitral valve has been repaired/replaced. Mild mitral valve regurgitation. No evidence of mitral stenosis.   4. The aortic valve is tricuspid. There is mild calcification of the aortic valve. Aortic valve regurgitation is not visualized. Aortic valve sclerosis is present, with no evidence of aortic valve stenosis.   5. The inferior vena cava is dilated in  size with >50% respiratory variability, suggesting right atrial pressure of 8 mmHg.      Patient Profile     62 y.o. female with HFpEF, CAD s/p PCI, PAF s/p ablation, mitral regurgitation s/p mitraclip, CKD 3b,DM, HTN, HL, PAD and OSA on CPAP admited with NSTEMI.   Assessment & Plan    NSTEMI -- Underwent cardiac cath noted above with severe multivessel CAD, culprit felt to be 90% ISR of p/mLAD treated with successful scoring balloon angioplasty. Does have residual osital RCA disease of 85% that will need intervention once function improves. No recurrent chest pain. -- continue DAPT with ASA/Brilinta, coreg 12.5mg  BID, Imdur 60mg  daily Okay to stop heparin  AKI on CKD IIIb/IV -- Cr 2.75>>3.7>>4.25 -- last dose of IV lasix 5/26, minimal UOP overnight -- started on IV lasix 80mg  BID per AHF -- nephrology following Discussions being had about placing trialysis catheter in the thoughts of potentially pursuing at least short-term dialysis.  Acute on chronic HFpEF Mildly reduced RV/RV overload -- has cardiomems, follows with AHF as an outpatient -- volume status is somewhat difficult to assess 2/2 body habitus, though suspect she is volume overloaded -- Echo this admission with LVEF of 50-55%, rWMA, mildly reduced RV and RV overload  -- LVEDP on cath 5/25 -- started on IV lasix 80mg  BID this morning per AHF -- GDMT: on coreg 12.5mg  BID Difficult scenario because his clinical exam is difficult to truly determine volume status.  We have asked the heart failure team to consult since she is a Dr. Patterson Hammersmith patient.  Can assess her volume status with CardioMEMS, however I suspect that she is volume overloaded.  She has had minimal urine output since her cath.  Requiring CPAP overnight.  S/p mitra-clip -- mild MR on echo this admission  Epistaxis Coffee ground emesis Acute on chronic anemia -- no further vomiting, brief episode of epistaxis this morning, now resolved -- Hgb  9.7>>8.4>>7.9 -- receiving aranesp -- Iron 15, defer management to nephrology/primary Okay to stop heparin If hemoglobin stays below 8 would consider transfusion, will defer to nephrology.  Paroxsymal afib -- s/p prior ablation, not on anticoagulation PTA -- currently in SR  UTI  -- PCP treating prior to admission, now on kelfex  HLD -- intolerant to statin -- on repatha, zetia PTA  For questions or updates, please contact [facility] Please consult [email or web address] for contact info under        Signed, Laverda Page, NP  12/11/2022, 8:59 AM     ATTENDING ATTESTATION  I have seen, examined and evaluated the patient this afternoon after discussing with Laverda Page, NP.  After reviewing all the available data and chart, we discussed the patients laboratory, study & physical findings as well as symptoms in detail.  I agree with her findings, examination as well as impression recommendations as per our discussion.  Attending adjustments noted in italics. .  From CAD standpoint she is stabilized now with no further angina.  I think some of her discomfort now is related to CHF.  We have consulted the heart failure service to assist with management and will defer to discussion between heart failure and nephrology as to how we appropriately address her volume status. Post PCI she is on DAPT.  We can now stop heparin as she is no longer having pain. I suspect that her RCA lesion have to be treated in a staged manner later on once the renal function stabilizes or she undergoes dialysis.  Interventional cardiology will follow along, but at this point would defer management options to advanced heart failure and nephrology as well as primary team.    Marykay Lex, MD, MS Bryan Lemma, M.D., M.S. Interventional Cardiologist  [facility]  Pager # [phone] Phone # [phone] [address] [place], [place] [address]

## 2022-12-11 NOTE — Care Management Important Message (Signed)
Important Message  Patient Details  Name: Dana [name] MRN: [id number] Date of Birth: January 28, 1961   Medicare Important Message Given:  Yes     Renie Ora 12/11/2022, 9:35 AM

## 2022-12-11 NOTE — Consult Note (Addendum)
Advanced Heart Failure Team Consult Note   Primary Physician: Blane Ohara, MD PCP-Cardiologist:  Arvilla Meres, MD  Reason for Consultation: Heart Failure   HPI:    Dana [name] is seen today for evaluation of heart failure  at the request of Dr Herbie Baltimore.  .  Admitted 03/22 with NSTEMI and a/c CHF. LHC 03/22 showed moderate mid-LAD and mid-RCA disease, severe mid-L-Cx stenosis s/p PCI/DES mid Cx and side branch angioplasty of OM1.   Unfortunately she developed a contrast-induced nephropathy after cath and creatinine increased from 2.2 to a peak of 5.5. Additionally, she developed severe nausea and vomiting, gastroparesis and required IV antiplatelet therapy.  During this time she developed epistaxis and acute blood loss anemia requiring transfusion.  She was switched back to oral DAPT. Diuretics were reintroduced and her creatinine began to improve over the following days. Her leukocytosis persisted but all her infectious workup was negative and this was felt to be reactive.     Per chart review, she was admitted 6/22 to [facility] for DKA, started on insulin gtt, IVF and diuretics held. Developed pulmonary vascular congestion vs PNA and was started on IV abx, and diuretics were resumed. She suffered a PEA arrest 01/12/21 with ROSC after 10-15 minutes of CPR and was transferred to [facility]; precipitating factor for PEA arrest thought to be respiratory driven. On arrival, she was still in DKA, AKI on CKD with oliguria and Nephrology was consulted for further management. TTE with EF 60%, min MR/TR. Concern for stroke due to left-sided weakness and MRI confirmed anoxic brain injury, but no definite infarct. She was extubated, SCr returned to baseline of 1.6-1.8 and she was discharged to rehab facility.    Zio placed 8/22 for palpitations, showing mostly SB/SR no afib, 66 SVT runs, frequent isolated SVE (11.2%).   Admitted from AHF clinic 10/22 for A/C CHF after failing outpatient escalation in  diuretic therapy. She was enrolled in FASTR trial and diuresed > 15 lb.  Hospitalization complicated by AKI on CKD, Scr up to 3.13. RHC showed mild to moderate elevating filling pressures with large v-waves in PCWP tracing suggesting severe MR vs severe diastolic dysfunction. TEE 55-60% showed severe central MR.    Patient underwent TEER of mitral valve with placement of 1XT W and 1NT W clip on 05/18/2021. NT W clip was entangled and had to be deployed into lateral part of valve. No additional clip placement could be preformed. Echo 11/04 with EF 50-55% and residual moderate MR. Given 1 dose IV lasix prior to discharge.   Echo 3/23 showed EF 55-60%, RV OK, moderate MR mean gradient 7-8 mmHg. Saw Dr. Lynnette Caffey and MR now moderate, no indication for 3rd clip.    Echo 07/18/22 reviewed by [facility] 07/18/22. LVEF 55-60%, normal RV and moderate MR, mG 7.5 mmHg, essentially unchanged from prior echo.   PTA PCP treating UTI with antibiotics.   Presented to [facility] with chest pain in the setting of NSTEMI. HS Trop peaked at 2035. Emergent cath 5/25  DES --> LAD also  had 85% ostial proximal RCA stenosis. LVEDP 23. Given total of 60 cc contrast. Given IV fluids post cath x 12 hours. Worsening renal function. Creatinine up to 4.25. Off diuretics for the last 24 hours. No urine output for the last 12 hours. Using CPAP at night.     Echo this admit: EF 50-55%. RV mildly reduced. Post mitral clip x 2 with XTW and NTW the latter entangled in chords no  obvious SLD noted mean gradient 5 mmHg. Mild residual MR appears medial to the two clips    Creatinine 2.75>3.7>4.25  BUN  56>71>79  + Orthopnea. SOB with exertion. + Bendopnea. Denies chest pain.   Cardiac Studies   - RHC (10/22):   RA = 8 RV = 59/9 PA = 61/17 (35) PCW = mean 23 (v = 49) -> performed multiple wedges Fick cardiac output/index = PVR = 1.6 WU FA sat = 98% PA sat = 69%, 70% PaPi = 5.5 Assessment: 1. Mild to moderately elevated  filling pressures with very large v-waves in PCWP tracing suggestive of severe diastolic dysfunction vs severe MR  Echo (1/24): EF 55-60%, normal RV and moderate MR, mG 7.5 mmHg, essentially unchanged from prior echo.    - Echo (3/23): EF 55-60%, RV normal, moderate MR  - Echo 11/22: EF 50-55%, residual moderate MR - TEE (10/22): EF 55-60%, basal inferior akinesis, severe central MR. - Echo (10/22): EF 65-70%, RV ok, moderate MR - Echo (5/21): EF 60-65%, RV ok, Grade II DD. - Echo (2/22): EF 55-60%, RV ok, Grade II DD, mod MR - Echo (3/22): EF 55-60%, Grade II DD, normal RV - Echo (6/22): EF 60%, mod MR/TR (per chart review at [facility]). - Zio (8/19): no arrhythmias, occasional PAC/PVCs - Zio (9/22): mostly SB/SR no afib, 66 SVT runs, frequent isolated SVE (11.2%).   - LHC (3/22): 3 vessel CAD with moderate mid-LAD and mid-RCA stenoses, and severe mid-LCx stenosis; successful PCI of the mid-circumflex, reducing severe 90% stenosis to 0% with a 2.75x22 mm Resolute Onyx DES, provisional side branch angioplasty of the OM1 with a 2.5 mm balloon through the stent struts   Review of Systems: [y] = yes, [ ]  = no   General: Weight gain [ Y]; Weight loss [ ] ; Anorexia [ ] ; Fatigue [ ] ; Fever [ ] ; Chills [ ] ; Weakness [ ]   Cardiac: Chest pain/pressure [ ] ; Resting SOB [ ] ; Exertional SOB [ Y]; Orthopnea [ Y]; Pedal Edema [ ] ; Palpitations [ ] ; Syncope [ ] ; Presyncope [ ] ; Paroxysmal nocturnal dyspnea[ ]   Pulmonary: Cough [ ] ; Wheezing[ ] ; Hemoptysis[ ] ; Sputum [ ] ; Snoring [ ]   GI: Vomiting[ ] ; Dysphagia[ ] ; Melena[ ] ; Hematochezia [ ] ; Heartburn[ ] ; Abdominal pain [ ] ; Constipation [ ] ; Diarrhea [ ] ; BRBPR [ ]   GU: Hematuria[ ] ; Dysuria [ ] ; Nocturia[ ]   Vascular: Pain in legs with walking [ ] ; Pain in feet with lying flat [ ] ; Non-healing sores [ ] ; Stroke [ ] ; TIA [ ] ; Slurred speech [ ] ;  Neuro: Headaches[ ] ; Vertigo[ ] ; Seizures[ ] ; Paresthesias[ ] ;Blurred vision [ ] ; Diplopia [ ] ; Vision changes [  ]  Ortho/Skin: Arthritis [ ] ; Joint pain [ Y]; Muscle pain [ ] ; Joint swelling [ ] ; Back Pain [ Y]; Rash [ ]   Psych: Depression[Y ]; Anxiety[ ]   Heme: Bleeding problems [ ] ; Clotting disorders [ ] ; Anemia [ ]   Endocrine: Diabetes [ Y]; Thyroid dysfunction[ ]   Home Medications Prior to Admission medications   Medication Sig Start Date End Date Taking? Authorizing Provider  allopurinol (ZYLOPRIM) 300 MG tablet Take 1 tablet (300 mg total) by mouth daily. 07/05/22  Yes Cox, Fritzi Mandes, MD  aspirin EC 81 MG tablet Take 1 tablet (81 mg total) by mouth daily. Swallow whole. 09/09/22  Yes Cyndi Bender, NP  busPIRone (BUSPAR) 5 MG tablet Take 1 tablet (5 mg total) by mouth 3 (three) times daily. 10/09/22  Yes CoxFritzi Mandes, MD  carvedilol (COREG) 12.5 MG tablet TAKE ONE TABLET BY MOUTH TWICE DAILY Patient taking differently: Take 12.5 mg by mouth 2 (two) times daily with a meal. 01/09/22  Yes Milford, Anderson Malta, FNP  cephALEXin (KEFLEX) 250 MG capsule Take 250 mg by mouth at bedtime. 11/30/22  Yes [provider]  clopidogrel (PLAVIX) 75 MG tablet Take 1 tablet (75 mg total) by mouth daily. 07/05/22  Yes Cox, Kirsten, MD  CRANBERRY PO Take 2 tablets by mouth daily.   Yes [provider]  estradiol (ESTRACE) 0.1 MG/GM vaginal cream Place 1 Applicatorful vaginally every other day. 11/27/22  Yes [provider]  Evolocumab (REPATHA SURECLICK) 140 MG/ML SOAJ Inject 1 Dose into the skin every 14 (fourteen) days. 01/17/22  Yes Hilty, Lisette Abu, MD  ezetimibe (ZETIA) 10 MG tablet Take 1 tablet (10 mg total) by mouth daily. 11/05/22  Yes Cox, Kirsten, MD  Furosemide (FUROSCIX Bell) Inject into the skin.   Yes [provider]  hydrALAZINE (APRESOLINE) 25 MG tablet Take 1 tablet (25 mg total) by mouth 3 (three) times daily. 09/21/22  Yes Milford, Anderson Malta, FNP  HYDROcodone-acetaminophen (NORCO) 7.5-325 MG tablet TAKE ONE TABLET BY MOUTH THREE TIMES DAILY AS NEEDED Patient taking differently:  Take 1 tablet by mouth 3 (three) times daily as needed for moderate pain. 11/28/22  Yes Cox, Kirsten, MD  insulin aspart (NOVOLOG FLEXPEN) 100 UNIT/ML FlexPen 2-10 U before meals based on SSI. 10/09/22  Yes Cox, Kirsten, MD  isosorbide mononitrate (IMDUR) 30 MG 24 hr tablet Take 1 tablet (30 mg total) by mouth daily. 07/05/22  Yes Cox, Kirsten, MD  latanoprost (XALATAN) 0.005 % ophthalmic solution Place 1 drop into both eyes at bedtime.   Yes [provider]  LEVEMIR FLEXPEN 100 UNIT/ML FlexPen Inject 50 units into THE SKIN daily Patient taking differently: Inject 50 Units into the skin daily. 07/30/22  Yes Cox, Fritzi Mandes, MD  linaclotide [facility]) 145 MCG CAPS capsule Take 1 capsule (145 mcg total) by mouth daily before breakfast. 08/31/22  Yes Cox, Kirsten, MD  LORazepam (ATIVAN) 0.5 MG tablet TAKE ONE TABLET BY MOUTH ONCE DAILY AS NEEDED FOR ANXIETY Patient taking differently: Take 0.5 mg by mouth every 6 (six) hours as needed for anxiety. 10/21/22  Yes Cox, Kirsten, MD  magnesium oxide (MAG-OX) 400 MG tablet Take 2 tablets (800 mg total) by mouth daily. Take 2 (400 mg) daily=800 mg 04/09/18  Yes Graciella Freer, PA-C  nitroGLYCERIN (NITROSTAT) 0.4 MG SL tablet Dissolve 1 tab under tongue as needed for chest pain. May repeat every 5 minutes x 2 doses. If no relief call 9-1-1. Please call [phone] to schedule an appointment for future refills. Thank you. 07/13/22  Yes Janetta Hora, PA-C  Omega-3 Fatty Acids (FISH OIL PO) Take 1 capsule by mouth daily.   Yes [provider]  OXYGEN Inhale 2 L into the lungs at bedtime.   Yes [provider]  pantoprazole (PROTONIX) 40 MG tablet Take 1 tablet (40 mg total) by mouth daily. 07/05/22  Yes Cox, Kirsten, MD  potassium chloride SA (KLOR-CON M) 20 MEQ tablet TAKE ONE TABLET BY MOUTH ONCE daily 07/06/22  Yes Cox, Kirsten, MD  Probiotic CAPS Take 1 capsule by mouth daily.   Yes [provider]  promethazine  (PHENERGAN) 25 MG tablet Take 1 tablet (25 mg total) by mouth daily as needed. Patient taking differently: Take 25 mg by mouth daily as needed for nausea or vomiting. 10/22/22  Yes Cox, Kirsten,  MD  sulfamethoxazole-trimethoprim (BACTRIM DS) 800-160 MG tablet Take 1 tablet by mouth 2 (two) times daily. 11/30/22  Yes [provider]  torsemide (DEMADEX) 20 MG tablet TAKE 4 TABLETS BY MOUTH ONCE DAILY Patient taking differently: Take 80 mg by mouth daily. 04/09/22  Yes Bensimhon, Bevelyn Buckles, MD  Continuous Blood Gluc Sensor (FREESTYLE LIBRE 2 SENSOR) MISC USE TO check blood glucose AS DIRECTED AND CHANGE sensor every 14 DAYS 01/11/22   [provider]  EPINEPHrine 0.3 mg/0.3 mL IJ SOAJ injection Use as directed for life-threatening allergic reaction. 06/24/18   Marcelyn Bruins, MD  Insulin Pen Needle (BD PEN NEEDLE NANO 2ND GEN) 32G X 4 MM MISC Inject 1 each into the skin in the morning, at noon, in the evening, and at bedtime. 08/30/21   CoxFritzi Mandes, MD    Past Medical History: Past Medical History:  Diagnosis Date   Acquired absence of left great toe [facility])    Acquired absence of other left toe(s) [facility])    ACS (acute coronary syndrome) (HCC)    Anemia    Anoxic brain injury (HCC)    Anxiety    CAD (coronary artery disease)    DES mLAD 04/07/15; DES dRCA & DES pPDA 08/30/16; DES mCX & PTCA OM1 09/29/20   Chronic diastolic (congestive) heart failure (HCC)    Chronic pain    Chronic systolic (congestive) heart failure (HCC)    CKD (chronic kidney disease)    Contrast dye induced nephropathy, possible 09/03/2020   COPD (chronic obstructive pulmonary disease) (HCC)    Diabetes (HCC)type 1    Diastolic CHF (HCC)    Dyspnea    Family history of breast cancer 10/23/2021   Gastro-esophageal reflux disease without esophagitis    Hyperlipidemia    Hypertension    Hypertensive heart disease with heart failure (HCC)    Hypoxemia    Idiopathic gout, multiple sites     Leukocytosis 03/29/2022   Localized edema    Low back pain    Mitral regurgitation    Mitral regurgitation    Mixed hyperlipidemia    Mixed incontinence    Morbid (severe) obesity due to excess calories (HCC)    Neuromuscular disorder (HCC)    Neuropathy    Non-ST elevation (NSTEMI) myocardial infarction (HCC)    08/29/20, 09/29/20   OSA (obstructive sleep apnea) 09/03/2020   Other specified hypothyroidism    PAF (paroxysmal atrial fibrillation) (HCC)    s/p pulmonary vein isolation by cryoablation 10/04/15 Dr. Rudolpho Sevin in [facility]   PEA (Pulseless electrical activity) [facility])    ~ 01/13/21 at [facility] during admission DKA, volume overload, possible CAP, hypoxia s/p ACLS with ROSC ~ 10-15   Pneumonia    Primary insomnia    Rheumatoid arthritis (HCC)    Stroke (HCC)    Thyroid disease    Type 1 diabetes mellitus (HCC)    Vitamin D deficiency     Past Surgical History: Past Surgical History:  Procedure Laterality Date   ABDOMINAL HYSTERECTOMY     Partial- Still has both ovaries   CARDIOVASCULAR STRESS TEST  08/13/2014   Nuclear; Normal   CARPAL TUNNEL RELEASE     CATARACT EXTRACTION, BILATERAL     CESAREAN SECTION     CHOLECYSTECTOMY     CORONARY ANGIOPLASTY WITH STENT PLACEMENT     3 blockages/ 1 stent   CORONARY PRESSURE/FFR STUDY N/A 09/07/2022   Procedure: INTRAVASCULAR PRESSURE WIRE/FFR STUDY;  Surgeon: Swaziland, Peter M, MD;  [facility] INVASIVE CV LAB;  Service: Cardiovascular;  Laterality: N/A;   CORONARY STENT INTERVENTION N/A 09/29/2020   Procedure: CORONARY STENT INTERVENTION;  Surgeon: Tonny Bollman, MD;  Location: [facility] INVASIVE CV LAB;  Service: Cardiovascular;  Laterality: N/A;  CFX   CORONARY STENT INTERVENTION N/A 09/07/2022   Procedure: CORONARY STENT INTERVENTION;  Surgeon: Swaziland, Peter M, MD;  Location: [facility] INVASIVE CV LAB;  Service: Cardiovascular;  Laterality: N/A;   EYE SURGERY     LEFT HEART CATH AND CORONARY ANGIOGRAPHY N/A 09/29/2020   Procedure:  LEFT HEART CATH AND CORONARY ANGIOGRAPHY;  Surgeon: Tonny Bollman, MD;  Location: [facility] INVASIVE CV LAB;  Service: Cardiovascular;  Laterality: N/A;   LEFT HEART CATH AND CORONARY ANGIOGRAPHY N/A 09/07/2022   Procedure: LEFT HEART CATH AND CORONARY ANGIOGRAPHY;  Surgeon: Swaziland, Peter M, MD;  Location: [facility] INVASIVE CV LAB;  Service: Cardiovascular;  Laterality: N/A;   MITRAL VALVE REPAIR N/A 05/18/2021   Procedure: MITRAL VALVE REPAIR;  Surgeon: Orbie Pyo, MD;  Location: [facility] INVASIVE CV LAB;  Service: Cardiovascular;  Laterality: N/A;   RIGHT HEART CATH N/A 04/27/2021   Procedure: RIGHT HEART CATH;  Surgeon: Dolores Patty, MD;  Location: [facility] INVASIVE CV LAB;  Service: Cardiovascular;  Laterality: N/A;   RIGHT/LEFT HEART CATH AND CORONARY ANGIOGRAPHY N/A 01/07/2017   Procedure: Right/Left Heart Cath and Coronary Angiography;  Surgeon: Laurey Morale, MD;  Location: [facility] INVASIVE CV LAB;  Service: Cardiovascular;  Laterality: N/A;   ROTATOR CUFF REPAIR     TEE WITHOUT CARDIOVERSION N/A 04/28/2021   Procedure: TRANSESOPHAGEAL ECHOCARDIOGRAM (TEE);  Surgeon: Dolores Patty, MD;  Location: [facility] ENDOSCOPY;  Service: Cardiovascular;  Laterality: N/A;   TEE WITHOUT CARDIOVERSION N/A 05/18/2021   Procedure: TRANSESOPHAGEAL ECHOCARDIOGRAM (TEE);  Surgeon: Orbie Pyo, MD;  Location: [facility] INVASIVE CV LAB;  Service: Cardiovascular;  Laterality: N/A;   TOE AMPUTATION Left    US ECHOCARDIOGRAPHY  05/2017   Normal    Family History: Family History  Problem Relation Age of Onset   Breast cancer Mother 71       contralateral breast ca dx 36   Coronary artery disease Mother    Hypertension Mother    Hyperlipidemia Mother    Diabetes type II Mother    Lung cancer Mother 34   Diabetes Father    Hypertension Father    Mental illness Sister    Bipolar disorder Other    Depression Other    Schizophrenia Other    Cancer Other        MGF's sisters x2; unknown type    Social History: Social  History   Socioeconomic History   Marital status: Married    Spouse name: [name]   Number of children: 2   Years of education: 16   Highest education level: Manufacturing engineer (e.g., MA, Dana, MEng, MEd, MSW, MBA)  Occupational History   Occupation: on disability/retired early former Engineer, site  Tobacco Use   Smoking status: Former    Types: Cigarettes    Start date: 1983    Quit date: 1984    Years since quitting: 40.4    Passive exposure: Past   Smokeless tobacco: Never  Vaping Use   Vaping Use: Never used  Substance and Sexual Activity   Alcohol use: Never   Drug use: Never   Sexual activity: Not on file  Other Topics Concern   Not on file  Social History Narrative   Not on file   Social Determinants of  Health   Financial Resource Strain: Medium Risk (10/30/2022)   Overall Financial Resource Strain (CARDIA)    Difficulty of Paying Living Expenses: Somewhat hard  Food Insecurity: No Food Insecurity (12/06/2022)   Hunger Vital Sign    Worried About Running Out of Food in the Last Year: Never true    Ran Out of Food in the Last Year: Never true  Transportation Needs: No Transportation Needs (12/06/2022)   PRAPARE - Administrator, Civil Service (Medical): No    Lack of Transportation (Non-Medical): No  Physical Activity: Insufficiently Active (10/30/2022)   Exercise Vital Sign    Days of Exercise per Week: 1 day    Minutes of Exercise per Session: 10 min  Stress: Stress Concern Present (10/30/2022)   [employer] of Occupational Health - Occupational Stress Questionnaire    Feeling of Stress : To some extent  Social Connections: Moderately Integrated (07/25/2022)   Social Connection and Isolation Panel [NHANES]    Frequency of Communication with Friends and Family: More than three times a week    Frequency of Social Gatherings with Friends and Family: More than three times a week    Attends Religious Services: More than 4 times per year    Active Member of  [employer] or Organizations: No    Attends Banker Meetings: Never    Marital Status: Married    Allergies:  Allergies  Allergen Reactions   Naproxen Hives and Rash   Shellfish-Derived Products Hives, Swelling and Other (See Comments)    Angioedema    Strawberry (Diagnostic) Hives   Celecoxib Other (See Comments)    Stomach pain   Glucosamine Hives, Swelling and Other (See Comments)    Angioedema    Iron     IV iron transfusion - hives - Feb 2022 hospitalization   Metoclopramide Other (See Comments)    Facial twitching and stuttering   Metolazone Other (See Comments)    Acute renal failure   Statins Other (See Comments)    Muscle pain    Objective:    Vital Signs:   Temp:  [97.9 F (36.6 C)-98.5 F (36.9 C)] 98.3 F (36.8 C) (05/28 0806) Pulse Rate:  [73-87] 78 (05/28 0806) Resp:  [16-19] 16 (05/28 0806) BP: (124-144)/(47-59) 140/59 (05/28 0806) SpO2:  [90 %-95 %] 92 % (05/28 0806) Weight:  [108.5 kg] 108.5 kg (05/28 0436) Last BM Date : 12/07/22  Weight change: Filed Weights   12/09/22 0620 12/10/22 0702 12/11/22 0436  Weight: 105 kg 106.4 kg 108.5 kg    Intake/Output:   Intake/Output Summary (Last 24 hours) at 12/11/2022 0852 Last data filed at 12/11/2022 0300 Gross per 24 hour  Intake 1463.81 ml  Output 285 ml  Net 1178.81 ml      Physical Exam    General:  In bed. No resp difficulty HEENT: normal Neck: supple. JVP difficult to assess.  Carotids 2+ bilat; no bruits. No lymphadenopathy or thyromegaly appreciated. Cor: PMI nondisplaced. Regular rate & rhythm. No rubs, gallops or murmurs. Lungs: clear on 2 liters [place] Abdomen: soft, nontender, nondistended. No hepatosplenomegaly. No bruits or masses. Good bowel sounds. Extremities: no cyanosis, clubbing, rash, edema Neuro: alert & orientedx3, cranial nerves grossly intact. moves all 4 extremities w/o difficulty. Affect pleasant   Telemetry   SR 80s   EKG    N/A  Labs   Basic  Metabolic Panel: Recent Labs  Lab 12/07/22 0253 12/08/22 0159 12/09/22 0618 12/10/22 0231 12/11/22 0112  NA 135  136 135 133* 133*  K 4.0 4.1 3.8 4.1 3.8  CL 100 102 103 100 100  CO2 25 21* 20* 20* 20*  GLUCOSE 156* 184* 162* 154* 109*  BUN 65* 64* 56* 71* 79*  CREATININE 2.74* 2.89* 2.75* 3.70* 4.25*  CALCIUM 8.6* 8.4* 8.2* 8.2* 8.1*  PHOS  --   --   --  4.2 4.7*    Liver Function Tests: Recent Labs  Lab 12/06/22 1045 12/10/22 0231 12/11/22 0112  AST 17  --   --   ALT 18  --   --   ALKPHOS 139*  --   --   BILITOT 0.5  --   --   PROT 7.3  --   --   ALBUMIN 3.2* 2.7* 2.6*   No results for input(s): "LIPASE", "AMYLASE" in the last 168 hours. No results for input(s): "AMMONIA" in the last 168 hours.  CBC: Recent Labs  Lab 12/04/22 1133 12/04/22 1133 12/06/22 1045 12/07/22 0253 12/08/22 0159 12/09/22 0618 12/10/22 0231 12/11/22 0112  WBC 11.0*   < > 17.3* 11.8* 17.2* 15.1* 21.1* 13.4*  NEUTROABS 7.6  --  14.9*  --   --   --   --   --   HGB 9.7*   < > 9.3* 9.2* 8.9* 9.7* 8.4* 7.9*  HCT 31.6*  --  29.7* 29.5* 28.2* 30.4* 26.9* 24.7*  MCV 87.8  --  84.9 83.8 84.9 82.8 83.8 82.9  PLT 248   < > 235 221 238 242 211 194   < > = values in this interval not displayed.    Cardiac Enzymes: No results for input(s): "CKTOTAL", "CKMB", "CKMBINDEX", "TROPONINI" in the last 168 hours.  BNP: BNP (last 3 results) Recent Labs    09/07/22 1013 12/06/22 1814 12/08/22 0159  BNP 168.7* 143.5* 149.2*    ProBNP (last 3 results) No results for input(s): "PROBNP" in the last 8760 hours.   CBG: Recent Labs  Lab 12/10/22 0755 12/10/22 1129 12/10/22 1657 12/10/22 2137 12/11/22 0804  GLUCAP 137* 123* 103* 145* 101*    Coagulation Studies: No results for input(s): "LABPROT", "INR" in the last 72 hours.   Imaging   No results found.   Medications:     Current Medications:  allopurinol  300 mg Oral Daily   aspirin EC  81 mg Oral Daily   busPIRone  5 mg Oral  TID   carvedilol  12.5 mg Oral BID   [START ON 12/12/2022] cephALEXin  250 mg Oral QHS   cephALEXin  500 mg Oral Q12H   darbepoetin (ARANESP) injection - NON-DIALYSIS  150 mcg Subcutaneous Q Mon-1800   ezetimibe  10 mg Oral Daily   insulin aspart  0-15 Units Subcutaneous TID WC   insulin aspart  0-5 Units Subcutaneous QHS   insulin detemir  30 Units Subcutaneous Daily   isosorbide mononitrate  60 mg Oral Daily    morphine injection  1 mg Intravenous Once   oxymetazoline  1 spray Each Nare BID   pantoprazole  40 mg Oral Daily   sodium chloride flush  3 mL Intravenous Q12H   sodium chloride flush  3 mL Intravenous Q12H   ticagrelor  90 mg Oral BID    Infusions:  sodium chloride Stopped (12/09/22 2245)   nitroGLYCERIN Stopped (12/08/22 2311)   promethazine (PHENERGAN) injection (IM or IVPB) Stopped (12/10/22 1448)   sodium chloride        Patient Profile   Dana [name] is 62 y.o. female  with history of diastolic heart failure s/p cardiomems 02/2018, CAD, s/p DES RCA and LAD 2018, DMI, htn, hyperlipidemia, PAD, and OSA on CPAP, PAF s/p ablation, PAD s/p L great toe and 4th toe amputation, stage IIIb CKD.   Assessment/Plan   1. Chest Pain-->CAD-->NSTEM  H/P multiple PCI with stents to mid LAD and pRCA.  - NSTEMI (2/22). Cath deferred due to AKI. Treated medically.  - NSTEMI (7/22)-->S/p PCI/DES to mid Cx, balloon angioplasty to OM1  -LHC -->LAD DES, severe 85% ostial prox RCA will need eventual intervention but renal function precludes.  - On aspirin, coreg, brilinta - Allergy statins-->muscle pain.    2. AKI Post cath with worsening renal function. Given 60 cc contrast.  Admit Creatinine 2.7-->today 4.25. Had similar events after cath in 2022. Creatinine peaked at 5.  Nephrology following.  Avoid hypotension. Check bladder scan.   3. HFpEF , ICM  Has cardiomems  Echo this admit EF 50-55%  RV mildy reduced.  - NYHA III. Reporting bendopnea +orthopnea.  Exam difficult due to  body habitus. Off diuretics >24 hours with worsening renal function.  - Suspect she is volume overloaded. Reds Clip 56% suggestive of marked volume overload.  May need RHC to assess hemodynamics. Unable to place PICC with worsening renal function.  Hold off on inotropes. Looking back she had similar event in 2022 with gradual improvement.  - Give 80 mg IV lasix now. Assess response.  - Continue current coreg.  - Currently getting treated for UTI will need to avoid SGLT2i.   4. MR  2022 Had TEER- 1XT W and 1NT W clip on 05/18/2021. NT W clip was entangled and had to be deployed into lateral part of valve. No additional clip placement could be preformed. Echo this admit-->Post mitral clip x 2 with XTW and NTW the latter entangled in chords no obvious SLD noted mean gradient 5 mmHg. Mild residual MR appears medial to the two clips   5. UTI  -On Keflex Hold off on SGLT2i  6. OSA Continue nightly CPAP  7. DMII On SSI  8. Anemia, chronic  Iron stats 7%. Will need IV Iron.    Length of Stay: 4  Zhana Jeangilles, NP  12/11/2022, 8:52 AM  Advanced Heart Failure Team Pager [phone] (M-F; 7a - 5p)  Please contact [facility] for night-coverage after hours (4p -7a ) and weekends on [email or web address]

## 2022-12-11 NOTE — Progress Notes (Signed)
CARDIAC REHAB PHASE I   PRE:  Rate/Rhythm: 73 NSR  BP:  Sitting: 138/50      SaO2: 94 2L  MODE:  Ambulation: 110 ft   AD:   rollator  POST:  Rate/Rhythm: 84 NSR  BP:  Sitting: 121/61      SaO2: 91 2L  Pt amb with O2 and standby assistance, pt denies CP and reports minimal-moderate SOB during amb and was returned to room w/o complaint. Pt c/p of her legs feeling like "jelly" while walking and took x1 standing rest break. Sats stable throughout amb >91%. Pt was returned to chair.  Pt was educated on PTCA, restrictions, exercise guidelines, NTG use, temperature precautions, heart healthy and diabetic diet, and CRP2. Pt family asked a lot of good questions which were answered. Pt received "[employer]ouncing [employer]" book and materials on above.   Pt was doing CRP2 at [facility] in March however, pt had to stop d/t medical concerns. Pt has been trying to get back in program but has been unable to reach anybody there via telephone.   Faustino Congress  ACSM-CEP 2:22 PM 12/11/2022    Service time is from 1300 to 1415.

## 2022-12-11 NOTE — Progress Notes (Signed)
[facility] NEPHROLOGY PROGRESS NOTE  Assessment/ Plan: Pt is a 62 y.o. yo female  with CAD status post stent, chronic diastolic CHF, type I DM, HTN, HLD, PAD, OSA on CPAP, PAF status post ablation, PAD, CKD stage IIIb-IV, presented with chest pain, and a NTEMI seen as a consultation for AKI on CKD with the need of cardiac cath.  For CKD, she follows with Dr. Elenore Rota at [facility] for CKD IIIB-IV. Recent baseline creatinine level anywhere between 1.8-2.3 also history of proteinuria in the past however the RAAS blockers was held because of recurrent AKI.   # Acute kidney injury on CKD IIIb-IV, b/l cr 1.8-2.3: Likely hemodynamically mediated due to combination of diuretics use, Bactrim and NSTEMI.  Urinalysis bland.  She is now oliguric. -s/p cardiac cath on 5/25 with minimal use of contrast.  Patient received IV fluid and Lasix afterward per cardiology. -She is still oliguric and creatinine level increased There are no absolute indications for dialysis today.  I told her we have 2 paths to take 1) continue to hope renal function improves-  try not to do anything in the short term to harm kidneys which puts cardiac intervention off 2) resign ourselves to dialysis -  get it started so the rest of her care can proceed.  She opts for the second option-  wants to get better.  Has appropriate questions for what the future looks like-  may be interested in PD as a long term option   Will look to place [facility] likely tomorrow followed by [facility]   # NSTEMI with ongoing chest pain: Cardiac cath on 5/25 with severe multivessel disease, in a standard restenosis status post balloon angioplasty.  She is clinically improved and on cardiac medication and anticoagulation per cardiology team. She needs another intervention at some point   # Chronic diastolic CHF: no diuretics today please.   # Anemia of CKD: She gets erythropoietin injection with her hematologist. Hb stable but low, redose ESA.  Iron  also very low-  has an allergy to iron ?  Will ask her about this    # Urinary tract infection: It seems like she was started on Bactrim as a prophylaxis because of frequent UTI.  Discontinues Bactrim and switched to Keflex. Last urine without signs of infection   Subjective:   UOP low -  less than 300- she thinks accurate-  crt up again today but rate of rise is less -  just SOB on exertion and some nausea -  she is overwhelmed   Objective Vital signs in last 24 hours: Vitals:   12/10/22 2328 12/11/22 0009 12/11/22 0436 12/11/22 0806  BP:  (!) 130/47 (!) 139/54 (!) 140/59  Pulse: 81 77 73 78  Resp: 16 17 18 16   Temp:  98.5 F (36.9 C) 98 F (36.7 C) 98.3 F (36.8 C)  TempSrc:  Oral Oral Oral  SpO2: 93% 94% 95% 92%  Weight:   108.5 kg   Height:       Weight change:   Intake/Output Summary (Last 24 hours) at 12/11/2022 0908 Last data filed at 12/11/2022 0300 Gross per 24 hour  Intake 1463.81 ml  Output 285 ml  Net 1178.81 ml       Labs: RENAL PANEL Recent Labs  Lab 12/06/22 1045 12/07/22 0253 12/08/22 0159 12/09/22 0618 12/10/22 0231 12/11/22 0112  NA 135 135 136 135 133* 133*  K 4.1 4.0 4.1 3.8 4.1 3.8  CL 99 100 102 103  100 100  CO2 23 25 21* 20* 20* 20*  GLUCOSE 213* 156* 184* 162* 154* 109*  BUN 65* 65* 64* 56* 71* 79*  CREATININE 2.73* 2.74* 2.89* 2.75* 3.70* 4.25*  CALCIUM 8.8* 8.6* 8.4* 8.2* 8.2* 8.1*  PHOS  --   --   --   --  4.2 4.7*  ALBUMIN 3.2*  --   --   --  2.7* 2.6*    Liver Function Tests: Recent Labs  Lab 12/06/22 1045 12/10/22 0231 12/11/22 0112  AST 17  --   --   ALT 18  --   --   ALKPHOS 139*  --   --   BILITOT 0.5  --   --   PROT 7.3  --   --   ALBUMIN 3.2* 2.7* 2.6*   No results for input(s): "LIPASE", "AMYLASE" in the last 168 hours. No results for input(s): "AMMONIA" in the last 168 hours. CBC: Recent Labs    03/29/22 0955 04/25/22 0813 07/19/22 1529 08/07/22 1103 10/02/22 1043 10/02/22 1044 10/30/22 1053  12/07/22 0253 12/08/22 0159 12/09/22 0618 12/10/22 0231 12/11/22 0112  HGB  --    < >  --    < >  --  9.6*   < > 9.2* 8.9* 9.7* 8.4* 7.9*  MCV  --    < >  --    < >  --  89.0   < > 83.8 84.9 82.8 83.8 82.9  VITAMINB12 596  --  345  --  473  --   --   --   --   --   --   --   FOLATE 9.4  --  9.7  --   --  10.6  --   --   --   --   --   --   FERRITIN 141  --  108  --  93  --   --   --   --   --   --   --   TIBC 263  --  262  --  279  --   --   --   --   --   --  213*  IRON 52  --  35  --  34  --   --   --   --   --   --  15*   < > = values in this interval not displayed.    Cardiac Enzymes: No results for input(s): "CKTOTAL", "CKMB", "CKMBINDEX", "TROPONINI" in the last 168 hours. CBG: Recent Labs  Lab 12/10/22 0755 12/10/22 1129 12/10/22 1657 12/10/22 2137 12/11/22 0804  GLUCAP 137* 123* 103* 145* 101*    Iron Studies:  Recent Labs    12/11/22 0112  IRON 15*  TIBC 213*   Studies/Results: ECHOCARDIOGRAM COMPLETE  Result Date: 12/09/2022    ECHOCARDIOGRAM REPORT   Patient Name:   Dana [name] Date of Exam: 12/09/2022 Medical Rec #:  [id number]      Height:       64.0 in Accession #:    [id number]     Weight:       231.5 lb Date of Birth:  [birth year]      BSA:          2.082 m Patient Age:    61 years       BP:           127/47 mmHg Patient Gender: F  HR:           79 bpm. Exam Location:  Inpatient Procedure: 2D Echo, Cardiac Doppler, Color Doppler and Intracardiac            Opacification Agent Indications:    mitral clip CAD  History:        Patient has prior history of Echocardiogram examinations. CAD;                 Mitral Valve Disease.  Sonographer:    Dondra Prader RVT Referring Phys: 65 DAVID W HARDING IMPRESSIONS  1. Abnormla septal motion suggestig RV pressure overload . Left ventricular ejection fraction, by estimation, is 50 to 55%. The left ventricle has low normal function. The left ventricle has no regional wall motion abnormalities. There is mild left  ventricular hypertrophy. Left ventricular diastolic parameters are indeterminate.  2. Right ventricular systolic function is mildly reduced. The right ventricular size is mildly enlarged.  3. Post mitral clip x 2 with XTW and NTW the latter entangled in chords no obvious SLD noted mean gradient 5 mmHg in diastole at HR of 80 bpm Mild residual MR appears medial to the two clips . The mitral valve has been repaired/replaced. Mild mitral valve regurgitation. No evidence of mitral stenosis.  4. The aortic valve is tricuspid. There is mild calcification of the aortic valve. Aortic valve regurgitation is not visualized. Aortic valve sclerosis is present, with no evidence of aortic valve stenosis.  5. The inferior vena cava is dilated in size with >50% respiratory variability, suggesting right atrial pressure of 8 mmHg. FINDINGS  Left Ventricle: Abnormla septal motion suggestig RV pressure overload. Left ventricular ejection fraction, by estimation, is 50 to 55%. The left ventricle has low normal function. The left ventricle has no regional wall motion abnormalities. The left ventricular internal cavity size was normal in size. There is mild left ventricular hypertrophy. Left ventricular diastolic parameters are indeterminate. Right Ventricle: The right ventricular size is mildly enlarged. No increase in right ventricular wall thickness. Right ventricular systolic function is mildly reduced. Left Atrium: Left atrial size was normal in size. Right Atrium: Right atrial size was normal in size. Pericardium: Trivial pericardial effusion is present. The pericardial effusion is posterior to the left ventricle. Mitral Valve: Post mitral clip x 2 with XTW and NTW the latter entangled in chords no obvious SLD noted mean gradient 5 mmHg in diastole at HR of 80 bpm Mild residual MR appears medial to the two clips. The mitral valve has been repaired/replaced. Mild mitral valve regurgitation. No evidence of mitral valve stenosis. MV  peak gradient, 16.6 mmHg. The mean mitral valve gradient is 5.5 mmHg. Tricuspid Valve: The tricuspid valve is normal in structure. Tricuspid valve regurgitation is not demonstrated. No evidence of tricuspid stenosis. Aortic Valve: The aortic valve is tricuspid. There is mild calcification of the aortic valve. Aortic valve regurgitation is not visualized. Aortic valve sclerosis is present, with no evidence of aortic valve stenosis. Aortic valve mean gradient measures 2.5 mmHg. Aortic valve peak gradient measures 4.8 mmHg. Aortic valve area, by VTI measures 2.83 cm. Pulmonic Valve: The pulmonic valve was normal in structure. Pulmonic valve regurgitation is trivial. No evidence of pulmonic stenosis. Aorta: The aortic root is normal in size and structure. Venous: The inferior vena cava is dilated in size with greater than 50% respiratory variability, suggesting right atrial pressure of 8 mmHg. IAS/Shunts: No atrial level shunt detected by color flow Doppler.  LEFT VENTRICLE PLAX 2D LVIDd:  4.40 cm   Diastology LVIDs:         3.40 cm   LV e' medial:    5.15 cm/s LV PW:         1.20 cm   LV E/e' medial:  38.4 LV IVS:        1.20 cm   LV e' lateral:   4.33 cm/s LVOT diam:     1.80 cm   LV E/e' lateral: 45.7 LV SV:         58 LV SV Index:   28 LVOT Area:     2.54 cm  RIGHT VENTRICLE            IVC RV Basal diam:  4.30 cm    IVC diam: 2.00 cm RV Mid diam:    3.80 cm RV S prime:     8.11 cm/s TAPSE (M-mode): 1.8 cm LEFT ATRIUM             Index        RIGHT ATRIUM           Index LA diam:        3.60 cm 1.73 cm/m   RA Area:     18.70 cm LA Vol (A2C):   76.0 ml 36.51 ml/m  RA Volume:   56.50 ml  27.14 ml/m LA Vol (A4C):   84.2 ml 40.44 ml/m LA Biplane Vol: 83.6 ml 40.16 ml/m  AORTIC VALVE                    PULMONIC VALVE AV Area (Vmax):    2.47 cm     PV Vmax:       1.16 m/s AV Area (Vmean):   2.45 cm     PV Peak grad:  5.4 mmHg AV Area (VTI):     2.83 cm AV Vmax:           109.30 cm/s AV Vmean:           75.950 cm/s AV VTI:            0.204 m AV Peak Grad:      4.8 mmHg AV Mean Grad:      2.5 mmHg LVOT Vmax:         106.00 cm/s LVOT Vmean:        73.100 cm/s LVOT VTI:          0.226 m LVOT/AV VTI ratio: 1.11  AORTA Ao Root diam: 2.80 cm MITRAL VALVE MV Area (PHT): 2.35 cm     SHUNTS MV Area VTI:   1.13 cm     Systemic VTI:  0.23 m MV Peak grad:  16.6 mmHg    Systemic Diam: 1.80 cm MV Mean grad:  5.5 mmHg MV Vmax:       2.04 m/s MV Vmean:      106.3 cm/s MV Decel Time: 323 msec MV E velocity: 198.00 cm/s MV A velocity: 121.00 cm/s MV E/A ratio:  1.64 Charlton Haws MD Electronically signed by Charlton Haws MD Signature Date/Time: 12/09/2022/3:12:11 PM    Final     Medications: Infusions:  sodium chloride Stopped (12/09/22 2245)   nitroGLYCERIN Stopped (12/08/22 2311)   promethazine (PHENERGAN) injection (IM or IVPB) Stopped (12/10/22 1448)   sodium chloride      Scheduled Medications:  allopurinol  300 mg Oral Daily   aspirin EC  81 mg Oral Daily   busPIRone  5 mg Oral TID   carvedilol  12.5  mg Oral BID   [START ON 12/12/2022] cephALEXin  250 mg Oral QHS   cephALEXin  500 mg Oral Q12H   darbepoetin (ARANESP) injection - NON-DIALYSIS  150 mcg Subcutaneous Q Mon-1800   ezetimibe  10 mg Oral Daily   furosemide  80 mg Intravenous BID   insulin aspart  0-15 Units Subcutaneous TID WC   insulin aspart  0-5 Units Subcutaneous QHS   insulin detemir  30 Units Subcutaneous Daily   isosorbide mononitrate  60 mg Oral Daily    morphine injection  1 mg Intravenous Once   oxymetazoline  1 spray Each Nare BID   pantoprazole  40 mg Oral Daily   sodium chloride flush  3 mL Intravenous Q12H   sodium chloride flush  3 mL Intravenous Q12H   ticagrelor  90 mg Oral BID    have reviewed scheduled and prn medications.  Physical Exam: General:NAD, comfortable Heart:RRR, s1s2 nl Lungs: Bibasal rhonchi, no wheeze Abdomen:soft, Non-tender, non-distended Extremities: Bilateral leg edema present but also generous  size of legs at baseline Neurology: Alert awake and following commands  Nester Bachus A Johnika Escareno 12/11/2022,9:08 AM  LOS: 4 days

## 2022-12-11 NOTE — Progress Notes (Signed)
ReDS Vest / Clip - 12/11/22 1055       ReDS Vest / Clip   Station Marker B    Ruler Value 32    ReDS Value Range High volume overload    ReDS Actual Value 56              Allyssa Abruzzese, BSN, Scientist, clinical (histocompatibility and immunogenetics) Only

## 2022-12-11 NOTE — Progress Notes (Signed)
PROGRESS NOTE    Dana [name]  MWU:[id number] DOB: 1960-12-18 DOA: 12/06/2022 PCP: Blane Ohara, MD    Brief Narrative:  62 year old with history of coronary artery disease status post multiple stenting, A-fib status post ablation, mitral regurgitation with MitraClip, sleep apnea on CPAP, obesity, cardiac arrest, chronic respiratory failure with hypoxemia, CKD stage IIIb with baseline creatinine about 1.9, anemia, hypertension, hyperlipidemia, gout, GERD, anxiety, depression and diastolic dysfunction presented with midsternal chest pain mostly on the left side transiently improved with nitroglycerin but recurred.  In the emergency room hemodynamically stable.  Creatinine 2.73 from recent baseline of 1.9.  Troponins negative.  EKG nonischemic.  Due to recurrent chest pain, case discussed with cardiology who recommended ischemia evaluation and patient admitted to the hospital.  5/25, overnight with worsening chest pain and shortness of breath.  Taken urgently to cardiac cath and stented LAD.  Developed acute hypoxemia and needed BiPAP overnight. 5/26, some clinical improvement.  Epistaxis on heparin.  Improved after stopping heparin. 5/28, renal functions remain poor, planning for dialysis.   Assessment & Plan:   Non-STEMI, Patient with extensive history of coronary artery disease, unstable angina: Patient had worsening chest pain after admission. 5/24 loaded with Brilinta and on nitro drip.  ST changes noted. Taken to cardiac cath emergently Drug-eluting stent proximal LAD to mid LAD lesion that was 90% stenosed. Patient does have other multiple areas of stenosis. Currently on aspirin, Brilinta.   Taken off nitroglycerin drip. Significant epistaxis on heparin, discontinued. Cardiology following.  Acute respiratory failure with hypoxemia, respiratory distress: Developed fluid overload, acute exacerbation of chronic diastolic congestive heart failure. Patient on oxygen and CPAP at night  at home. Using BiPAP in the hospital. Given oxygen to keep saturation more than 90%.  Some clinical stabilization today, however remains fluid overloaded due to oliguria today. Followed by heart failure team.  Lasix challenge.  Acute kidney injury with history of chronic kidney disease stage IIIb: Baseline creatinine about 1.9.  Multifactorial AKI including recent use of Bactrim. Holding antihypertensives as well as torsemide. Patient with worsening renal functions.  Creatinine more than 4 today.  Urine output about 200 mL last 24 hours. Had orthopnea overnight. Nephrology following, planning for starting hemodialysis tomorrow.  UTI: Recently diagnosed at urology office.  Cultures not available.  He started on Bactrim and long-term Keflex for prophylaxis. Reculture of the urine, pending Discontinue Bactrim.  Continue Keflex 500 mg 3 times daily for 5 days then she will go back on her chronic suppressive therapy 250 mg daily at bedtime.  Hyperlipidemia, on Zetia and Repatha Essential hypertension, Coreg and Imdur was continued.  Holding hydralazine and torsemide. A-fib, sinus rhythm post ablation. Mitral regurgitation post MitraClip. Type 2 diabetes, well-controlled.  Remains on long-acting insulin and sliding scale insulin. Sleep apnea, using CPAP.  BiPAP for respiratory distress. Gout, on home allopurinol. GERD, on PPI. Anxiety and depression, on BuSpar and Ativan.   DVT prophylaxis: Aspirin and Brilinta.  Heparin discontinued due to hemoptysis.   Code Status: Full code Family Communication: None at the bedside today. Disposition Plan: Status is: Inpatient.  Active treatment of non-STEMI, respiratory distress, acute renal failure     Consultants:  Cardiology Nephrology  Procedures:  None  Antimicrobials:  Bactrim, discontinue Keflex 5 mg twice daily 5/24   Subjective:  Patient seen and examined.  Currently denies chest pain or shortness of breath when she is sitting  up.  She had episode of chest discomfort when trying to lay flat.  Denied any  chest pain.  She still has some episodic epistaxis improved with Afrin and nasal pressure.  Overwhelmed and anxious due to ongoing issues.  Objective: Vitals:   12/10/22 2328 12/11/22 0009 12/11/22 0436 12/11/22 0806  BP:  (!) 130/47 (!) 139/54 (!) 140/59  Pulse: 81 77 73 78  Resp: 16 17 18 16   Temp:  98.5 F (36.9 C) 98 F (36.7 C) 98.3 F (36.8 C)  TempSrc:  Oral Oral Oral  SpO2: 93% 94% 95% 92%  Weight:   108.5 kg   Height:        Intake/Output Summary (Last 24 hours) at 12/11/2022 1036 Last data filed at 12/11/2022 0300 Gross per 24 hour  Intake 1463.81 ml  Output 125 ml  Net 1338.81 ml    Filed Weights   12/09/22 0620 12/10/22 0702 12/11/22 0436  Weight: 105 kg 106.4 kg 108.5 kg    Examination:  General exam: Sitting at the edge of the bed.  On minimum oxygen.  Appropriately anxious about her circumstances but able to talk in complete sentences. Respiratory system: No added sounds.  Poor inspiratory effort. SpO2: 92 % O2 Flow Rate (L/min): 6 L/min FiO2 (%): 44 %  Cardiovascular system: S1 & S2 heard, RRR.  Chronic nonpitting edema. Gastrointestinal system: Abdomen is nondistended, soft and nontender. No organomegaly or masses felt. Normal bowel sounds heard. Central nervous system: Alert and oriented. No focal neurological deficits. Extremities: Symmetric 5 x 5 power. Skin: No rashes, lesions or ulcers Psychiatry: Judgement and insight appear normal.     Data Reviewed: I have personally reviewed following labs and imaging studies  CBC: Recent Labs  Lab 12/04/22 1133 12/04/22 1133 12/06/22 1045 12/07/22 0253 12/08/22 0159 12/09/22 0618 12/10/22 0231 12/11/22 0112  WBC 11.0*   < > 17.3* 11.8* 17.2* 15.1* 21.1* 13.4*  NEUTROABS 7.6  --  14.9*  --   --   --   --   --   HGB 9.7*   < > 9.3* 9.2* 8.9* 9.7* 8.4* 7.9*  HCT 31.6*  --  29.7* 29.5* 28.2* 30.4* 26.9* 24.7*  MCV 87.8  --   84.9 83.8 84.9 82.8 83.8 82.9  PLT 248   < > 235 221 238 242 211 194   < > = values in this interval not displayed.    Basic Metabolic Panel: Recent Labs  Lab 12/07/22 0253 12/08/22 0159 12/09/22 0618 12/10/22 0231 12/11/22 0112  NA 135 136 135 133* 133*  K 4.0 4.1 3.8 4.1 3.8  CL 100 102 103 100 100  CO2 25 21* 20* 20* 20*  GLUCOSE 156* 184* 162* 154* 109*  BUN 65* 64* 56* 71* 79*  CREATININE 2.74* 2.89* 2.75* 3.70* 4.25*  CALCIUM 8.6* 8.4* 8.2* 8.2* 8.1*  PHOS  --   --   --  4.2 4.7*    GFR: Estimated Creatinine Clearance: 17.1 mL/min (A) (by C-G formula based on SCr of 4.25 mg/dL (H)). Liver Function Tests: Recent Labs  Lab 12/06/22 1045 12/10/22 0231 12/11/22 0112  AST 17  --   --   ALT 18  --   --   ALKPHOS 139*  --   --   BILITOT 0.5  --   --   PROT 7.3  --   --   ALBUMIN 3.2* 2.7* 2.6*    No results for input(s): "LIPASE", "AMYLASE" in the last 168 hours. No results for input(s): "AMMONIA" in the last 168 hours. Coagulation Profile: No results for input(s): "INR", "PROTIME" in  the last 168 hours. Cardiac Enzymes: No results for input(s): "CKTOTAL", "CKMB", "CKMBINDEX", "TROPONINI" in the last 168 hours. BNP (last 3 results) No results for input(s): "PROBNP" in the last 8760 hours. HbA1C: No results for input(s): "HGBA1C" in the last 72 hours. CBG: Recent Labs  Lab 12/10/22 0755 12/10/22 1129 12/10/22 1657 12/10/22 2137 12/11/22 0804  GLUCAP 137* 123* 103* 145* 101*    Lipid Profile: No results for input(s): "CHOL", "HDL", "LDLCALC", "TRIG", "CHOLHDL", "LDLDIRECT" in the last 72 hours. Thyroid Function Tests: No results for input(s): "TSH", "T4TOTAL", "FREET4", "T3FREE", "THYROIDAB" in the last 72 hours. Anemia Panel: Recent Labs    12/11/22 0112  TIBC 213*  IRON 15*   Sepsis Labs: Recent Labs  Lab 12/06/22 1456 12/06/22 1814  LATICACIDVEN 1.4 1.1     No results found for this or any previous visit (from the past 240 hour(s)).        Radiology Studies: ECHOCARDIOGRAM COMPLETE  Result Date: 12/09/2022    ECHOCARDIOGRAM REPORT   Patient Name:   Dana MUHA Date of Exam: 12/09/2022 Medical Rec #:  [id number]      Height:       64.0 in Accession #:    [id number]     Weight:       231.5 lb Date of Birth:  23-Aug-1960      BSA:          2.082 m Patient Age:    61 years       BP:           127/47 mmHg Patient Gender: F              HR:           79 bpm. Exam Location:  Inpatient Procedure: 2D Echo, Cardiac Doppler, Color Doppler and Intracardiac            Opacification Agent Indications:    mitral clip CAD  History:        Patient has prior history of Echocardiogram examinations. CAD;                 Mitral Valve Disease.  Sonographer:    Dondra Prader RVT Referring Phys: [id number] DAVID W HARDING IMPRESSIONS  1. Abnormla septal motion suggestig RV pressure overload . Left ventricular ejection fraction, by estimation, is 50 to 55%. The left ventricle has low normal function. The left ventricle has no regional wall motion abnormalities. There is mild left ventricular hypertrophy. Left ventricular diastolic parameters are indeterminate.  2. Right ventricular systolic function is mildly reduced. The right ventricular size is mildly enlarged.  3. Post mitral clip x 2 with XTW and NTW the latter entangled in chords no obvious SLD noted mean gradient 5 mmHg in diastole at HR of 80 bpm Mild residual MR appears medial to the two clips . The mitral valve has been repaired/replaced. Mild mitral valve regurgitation. No evidence of mitral stenosis.  4. The aortic valve is tricuspid. There is mild calcification of the aortic valve. Aortic valve regurgitation is not visualized. Aortic valve sclerosis is present, with no evidence of aortic valve stenosis.  5. The inferior vena cava is dilated in size with >50% respiratory variability, suggesting right atrial pressure of 8 mmHg. FINDINGS  Left Ventricle: Abnormla septal motion suggestig RV pressure overload.  Left ventricular ejection fraction, by estimation, is 50 to 55%. The left ventricle has low normal function. The left ventricle has no regional wall motion abnormalities. The left ventricular internal  cavity size was normal in size. There is mild left ventricular hypertrophy. Left ventricular diastolic parameters are indeterminate. Right Ventricle: The right ventricular size is mildly enlarged. No increase in right ventricular wall thickness. Right ventricular systolic function is mildly reduced. Left Atrium: Left atrial size was normal in size. Right Atrium: Right atrial size was normal in size. Pericardium: Trivial pericardial effusion is present. The pericardial effusion is posterior to the left ventricle. Mitral Valve: Post mitral clip x 2 with XTW and NTW the latter entangled in chords no obvious SLD noted mean gradient 5 mmHg in diastole at HR of 80 bpm Mild residual MR appears medial to the two clips. The mitral valve has been repaired/replaced. Mild mitral valve regurgitation. No evidence of mitral valve stenosis. MV peak gradient, 16.6 mmHg. The mean mitral valve gradient is 5.5 mmHg. Tricuspid Valve: The tricuspid valve is normal in structure. Tricuspid valve regurgitation is not demonstrated. No evidence of tricuspid stenosis. Aortic Valve: The aortic valve is tricuspid. There is mild calcification of the aortic valve. Aortic valve regurgitation is not visualized. Aortic valve sclerosis is present, with no evidence of aortic valve stenosis. Aortic valve mean gradient measures 2.5 mmHg. Aortic valve peak gradient measures 4.8 mmHg. Aortic valve area, by VTI measures 2.83 cm. Pulmonic Valve: The pulmonic valve was normal in structure. Pulmonic valve regurgitation is trivial. No evidence of pulmonic stenosis. Aorta: The aortic root is normal in size and structure. Venous: The inferior vena cava is dilated in size with greater than 50% respiratory variability, suggesting right atrial pressure of 8 mmHg.  IAS/Shunts: No atrial level shunt detected by color flow Doppler.  LEFT VENTRICLE PLAX 2D LVIDd:         4.40 cm   Diastology LVIDs:         3.40 cm   LV e' medial:    5.15 cm/s LV PW:         1.20 cm   LV E/e' medial:  38.4 LV IVS:        1.20 cm   LV e' lateral:   4.33 cm/s LVOT diam:     1.80 cm   LV E/e' lateral: 45.7 LV SV:         58 LV SV Index:   28 LVOT Area:     2.54 cm  RIGHT VENTRICLE            IVC RV Basal diam:  4.30 cm    IVC diam: 2.00 cm RV Mid diam:    3.80 cm RV S prime:     8.11 cm/s TAPSE (M-mode): 1.8 cm LEFT ATRIUM             Index        RIGHT ATRIUM           Index LA diam:        3.60 cm 1.73 cm/m   RA Area:     18.70 cm LA Vol (A2C):   76.0 ml 36.51 ml/m  RA Volume:   56.50 ml  27.14 ml/m LA Vol (A4C):   84.2 ml 40.44 ml/m LA Biplane Vol: 83.6 ml 40.16 ml/m  AORTIC VALVE                    PULMONIC VALVE AV Area (Vmax):    2.47 cm     PV Vmax:       1.16 m/s AV Area (Vmean):   2.45 cm     PV Peak grad:  5.4 mmHg AV Area (VTI):     2.83 cm AV Vmax:           109.30 cm/s AV Vmean:          75.950 cm/s AV VTI:            0.204 m AV Peak Grad:      4.8 mmHg AV Mean Grad:      2.5 mmHg LVOT Vmax:         106.00 cm/s LVOT Vmean:        73.100 cm/s LVOT VTI:          0.226 m LVOT/AV VTI ratio: 1.11  AORTA Ao Root diam: 2.80 cm MITRAL VALVE MV Area (PHT): 2.35 cm     SHUNTS MV Area VTI:   1.13 cm     Systemic VTI:  0.23 m MV Peak grad:  16.6 mmHg    Systemic Diam: 1.80 cm MV Mean grad:  5.5 mmHg MV Vmax:       2.04 m/s MV Vmean:      106.3 cm/s MV Decel Time: 323 msec MV E velocity: 198.00 cm/s MV A velocity: 121.00 cm/s MV E/A ratio:  1.64 Charlton Haws MD Electronically signed by Charlton Haws MD Signature Date/Time: 12/09/2022/3:12:11 PM    Final         Scheduled Meds:  allopurinol  300 mg Oral Daily   aspirin EC  81 mg Oral Daily   busPIRone  5 mg Oral TID   carvedilol  12.5 mg Oral BID   [START ON 12/12/2022] cephALEXin  250 mg Oral QHS   cephALEXin  500 mg Oral Q12H    Chlorhexidine Gluconate Cloth  6 each Topical Q0600   darbepoetin (ARANESP) injection - NON-DIALYSIS  150 mcg Subcutaneous Q Mon-1800   ezetimibe  10 mg Oral Daily   furosemide  80 mg Intravenous BID   insulin aspart  0-15 Units Subcutaneous TID WC   insulin aspart  0-5 Units Subcutaneous QHS   insulin detemir  30 Units Subcutaneous Daily   isosorbide mononitrate  60 mg Oral Daily    morphine injection  1 mg Intravenous Once   oxymetazoline  1 spray Each Nare BID   pantoprazole  40 mg Oral Daily   sodium chloride flush  3 mL Intravenous Q12H   sodium chloride flush  3 mL Intravenous Q12H   ticagrelor  90 mg Oral BID   Continuous Infusions:  sodium chloride Stopped (12/09/22 2245)   promethazine (PHENERGAN) injection (IM or IVPB) Stopped (12/10/22 1448)   sodium chloride       LOS: 4 days    Time spent: 35 minutes    Dorcas Carrow, MD [facility] Pager [phone]

## 2022-12-12 ENCOUNTER — Encounter: Payer: Self-pay | Admitting: Oncology

## 2022-12-12 ENCOUNTER — Other Ambulatory Visit (HOSPITAL_COMMUNITY): Payer: Self-pay

## 2022-12-12 DIAGNOSIS — I200 Unstable angina: Secondary | ICD-10-CM | POA: Diagnosis not present

## 2022-12-12 LAB — RENAL FUNCTION PANEL
Albumin: 2.5 g/dL — ABNORMAL LOW (ref 3.5–5.0)
Anion gap: 12 (ref 5–15)
BUN: 85 mg/dL — ABNORMAL HIGH (ref 6–20)
CO2: 21 mmol/L — ABNORMAL LOW (ref 22–32)
Calcium: 8.2 mg/dL — ABNORMAL LOW (ref 8.9–10.3)
Chloride: 101 mmol/L (ref 98–111)
Creatinine, Ser: 4.26 mg/dL — ABNORMAL HIGH (ref 0.44–1.00)
GFR, Estimated: 11 mL/min — ABNORMAL LOW (ref >60)
Glucose, Bld: 174 mg/dL — ABNORMAL HIGH (ref 70–99)
Phosphorus: 5.1 mg/dL — ABNORMAL HIGH (ref 2.5–4.6)
Potassium: 3.8 mmol/L (ref 3.5–5.1)
Sodium: 134 mmol/L — ABNORMAL LOW (ref 135–145)

## 2022-12-12 LAB — BPAM RBC
Blood Product Expiration Date: 202406112359
Unit Type and Rh: 600

## 2022-12-12 LAB — CBC
HCT: 23.7 % — ABNORMAL LOW (ref 36.0–46.0)
Hemoglobin: 7.7 g/dL — ABNORMAL LOW (ref 12.0–15.0)
MCH: 27.2 pg (ref 26.0–34.0)
MCHC: 32.5 g/dL (ref 30.0–36.0)
MCV: 83.7 fL (ref 80.0–100.0)
Platelets: 214 10*3/uL (ref 150–400)
RBC: 2.83 MIL/uL — ABNORMAL LOW (ref 3.87–5.11)
RDW: 15.2 % (ref 11.5–15.5)
WBC: 11.8 10*3/uL — ABNORMAL HIGH (ref 4.0–10.5)
nRBC: 0 % (ref 0.0–0.2)

## 2022-12-12 LAB — GLUCOSE, CAPILLARY
Glucose-Capillary: 146 mg/dL — ABNORMAL HIGH (ref 70–99)
Glucose-Capillary: 258 mg/dL — ABNORMAL HIGH (ref 70–99)
Glucose-Capillary: 189 mg/dL — ABNORMAL HIGH (ref 70–99)
Glucose-Capillary: 167 mg/dL — ABNORMAL HIGH (ref 70–99)

## 2022-12-12 LAB — TYPE AND SCREEN: ABO/RH(D): A NEG

## 2022-12-12 LAB — HEPATITIS B SURFACE ANTIBODY, QUANTITATIVE: Hep B S AB Quant (Post): <3.5 m[IU]/mL — ABNORMAL LOW (ref >9.9)

## 2022-12-12 MED ORDER — BISACODYL 10 MG RE SUPP: 10.0000 mg | Freq: Every day | RECTAL | Status: DC | PRN

## 2022-12-12 MED ORDER — OXYMETAZOLINE HCL 0.05 % NA SOLN: 1.0000 | Freq: Two times a day (BID) | NASAL | Status: AC | PRN

## 2022-12-12 MED ORDER — DIPHENHYDRAMINE HCL 50 MG/ML IJ SOLN
25.0000 mg | Freq: Three times a day (TID) | INTRAMUSCULAR | Status: DC | PRN
  Administered 2022-12-12 – 2022-12-15 (×4): 25 mg via INTRAVENOUS
  Filled 2022-12-12 (×4): qty 1

## 2022-12-12 MED ORDER — BISACODYL 10 MG RE SUPP
10.0000 mg | Freq: Once | RECTAL | Status: AC
  Administered 2022-12-12: 10 mg via RECTAL
  Filled 2022-12-12: qty 1

## 2022-12-12 MED ORDER — SODIUM CHLORIDE 0.9 % IV SOLN
250.0000 mg | Freq: Every day | INTRAVENOUS | Status: AC
  Administered 2022-12-12 – 2022-12-15 (×4): 250 mg via INTRAVENOUS
  Filled 2022-12-12: qty 250
  Filled 2022-12-12: qty 20
  Filled 2022-12-12: qty 250
  Filled 2022-12-12: qty 20

## 2022-12-12 NOTE — Progress Notes (Signed)
ReDS Vest / Clip - 12/12/22 1000       ReDS Vest / Clip   Station Marker B    Ruler Value 33    ReDS Value Range High volume overload    ReDS Actual Value 57            Ethyl Vila, BSN, Scientist, clinical (histocompatibility and immunogenetics) Only

## 2022-12-12 NOTE — Progress Notes (Signed)
Dana [name] is a 62 y.o. yo female  with CAD, chronic diastolic CHF, type I DM, HTN, HLD, PAD, OSA on CPAP, PAF status post ablation, PAD, CKD stage IIIb-IV, presented with chest pain, and a NTEMI seen as a consultation for AKI on CKD with the need of cardiac cath.  For CKD, she follows with Dr. Elenore Rota at [facility] for CKD IIIB-IV. Recent baseline creatinine level anywhere between 1.8-2.3 also history of proteinuria in the past however the RAAS blockers was held because of recurrent AKI.   # Acute kidney injury on CKD IIIb-IV, b/l cr 1.8-2.3: Likely hemodynamically mediated due to combination of diuretics use, Bactrim and NSTEMI.  Urinalysis bland.   -s/p cardiac cath on 5/25 with minimal use of contrast.  -She was oliguric and creatinine level increased-  we had conversations and plan was to start HD in an effort to control volume more effectively -  bring bun down to help her feel better more quickly  However, now made good urine and numbers not worse-  she feels much better already.  I think is OK to put plans on hold to start dialysis and continue to attempt to manage her medically.  She understands this is still a tenuous situation.  If she fails to thrive either now or in future dialysis will be reconsidered.  She wishes to change her nephrology care to [facility]-  I can arrange    # NSTEMI with ongoing chest pain: Cardiac cath on 5/25 with severe multivessel disease, in a standard restenosis status post balloon angioplasty.  She is clinically improved and on cardiac medication and anticoagulation per cardiology team. She needs another intervention at some point but have been told it is not urgent   # Chronic diastolic CHF: may continue current dose of diuretics    # Anemia of CKD: She gets erythropoietin injection with her hematologist. Hb stable but low, redose ESA.  Iron also very low-  has an allergy to iron ?  She said hives but  has had since with premeds    # Urinary tract infection: It seems like she was started on Bactrim as a prophylaxis because of frequent UTI.  Discontinues Bactrim and switched to Keflex. Last urine without signs of infection   Subjective:   2600 of UOP-  BUN/crt essentially the same -  she is doing a lot better functionally today   Objective Vital signs in last 24 hours: Vitals:   12/12/22 0010 12/12/22 0114 12/12/22 0507 12/12/22 0726  BP:   (!) 135/55 (!) 133/55  Pulse: 71  72 73  Resp: 17  18 16   Temp:   97.6 F (36.4 C) 98.1 F (36.7 C)  TempSrc:   Oral Oral  SpO2: 94% 94% 98% 94%  Weight:   105.5 kg   Height:       Weight change: -0.953 kg  Intake/Output Summary (Last 24 hours) at 12/12/2022 1039 Last data filed at 12/12/2022 0508 Gross per 24 hour  Intake 243 ml  Output 2000 ml  Net -1757 ml       Labs: RENAL PANEL Recent Labs  Lab 12/06/22 1045 12/07/22 0253 12/08/22 0159 12/09/22 0618 12/10/22 0231 12/11/22 0112 12/12/22 0154  NA 135   < > 136 135 133* 133* 134*  K 4.1   < > 4.1 3.8 4.1 3.8 3.8  CL 99   < > 102 103 100 100 101  CO2 23   < >  21* 20* 20* 20* 21*  GLUCOSE 213*   < > 184* 162* 154* 109* 174*  BUN 65*   < > 64* 56* 71* 79* 85*  CREATININE 2.73*   < > 2.89* 2.75* 3.70* 4.25* 4.26*  CALCIUM 8.8*   < > 8.4* 8.2* 8.2* 8.1* 8.2*  PHOS  --   --   --   --  4.2 4.7* 5.1*  ALBUMIN 3.2*  --   --   --  2.7* 2.6* 2.5*   < > = values in this interval not displayed.    Liver Function Tests: Recent Labs  Lab 12/06/22 1045 12/10/22 0231 12/11/22 0112 12/12/22 0154  AST 17  --   --   --   ALT 18  --   --   --   ALKPHOS 139*  --   --   --   BILITOT 0.5  --   --   --   PROT 7.3  --   --   --   ALBUMIN 3.2* 2.7* 2.6* 2.5*   No results for input(s): "LIPASE", "AMYLASE" in the last 168 hours. No results for input(s): "AMMONIA" in the last 168 hours. CBC: Recent Labs    03/29/22 0955 04/25/22 0813 07/19/22 1529 08/07/22 1103 10/02/22 1043  10/02/22 1044 10/30/22 1053 12/08/22 0159 12/09/22 0618 12/10/22 0231 12/11/22 0112 12/12/22 0154  HGB  --    < >  --    < >  --  9.6*   < > 8.9* 9.7* 8.4* 7.9* 7.7*  MCV  --    < >  --    < >  --  89.0   < > 84.9 82.8 83.8 82.9 83.7  VITAMINB12 596  --  345  --  473  --   --   --   --   --   --   --   FOLATE 9.4  --  9.7  --   --  10.6  --   --   --   --   --   --   FERRITIN 141  --  108  --  93  --   --   --   --   --   --   --   TIBC 263  --  262  --  279  --   --   --   --   --  213*  --   IRON 52  --  35  --  34  --   --   --   --   --  15*  --    < > = values in this interval not displayed.    Cardiac Enzymes: No results for input(s): "CKTOTAL", "CKMB", "CKMBINDEX", "TROPONINI" in the last 168 hours. CBG: Recent Labs  Lab 12/11/22 0804 12/11/22 1159 12/11/22 1653 12/11/22 2109 12/12/22 0723  GLUCAP 101* 129* 166* 176* 146*    Iron Studies:  Recent Labs    12/11/22 0112  IRON 15*  TIBC 213*   Studies/Results: No results found.  Medications: Infusions:  sodium chloride Stopped (12/09/22 2245)   promethazine (PHENERGAN) injection (IM or IVPB) Stopped (12/10/22 1448)   sodium chloride      Scheduled Medications:  allopurinol  300 mg Oral Daily   aspirin EC  81 mg Oral Daily   busPIRone  5 mg Oral TID   carvedilol  12.5 mg Oral BID   cephALEXin  250 mg Oral QHS   darbepoetin (ARANESP) injection -  NON-DIALYSIS  150 mcg Subcutaneous Q Mon-1800   ezetimibe  10 mg Oral Daily   furosemide  80 mg Intravenous BID   insulin aspart  0-15 Units Subcutaneous TID WC   insulin aspart  0-5 Units Subcutaneous QHS   insulin detemir  30 Units Subcutaneous Daily   isosorbide mononitrate  60 mg Oral Daily    morphine injection  1 mg Intravenous Once   oxymetazoline  1 spray Each Nare BID   pantoprazole  40 mg Oral Daily   polyethylene glycol  17 g Oral Daily   sodium chloride flush  3 mL Intravenous Q12H   sodium chloride flush  3 mL Intravenous Q12H   ticagrelor  90  mg Oral BID    have reviewed scheduled and prn medications.  Physical Exam: General:NAD, comfortable Heart:RRR, s1s2 nl Lungs: Bibasal rhonchi, no wheeze Abdomen:soft, Non-tender, non-distended Extremities: Bilateral leg edema present but also generous size of legs at baseline Neurology: Alert awake and following commands  Maliha Outten A Celena Lanius 12/12/2022,10:39 AM  LOS: 5 days

## 2022-12-12 NOTE — Progress Notes (Signed)
CARDIAC REHAB PHASE I   PRE:  Rate/Rhythm: 72 SR    BP: sitting 132/45    SpO2: 88-92 RA chair, 86 RA bathroom  MODE:  Ambulation: 130 ft   POST:  Rate/Rhythm: 93 SR    BP: sitting 127/60     SpO2: 94 2L  Pt ambulated to BR where SpO2 86 RA therefore replaced 2L. Pt needed rest in chair at sink after BR due to her back pain accompanied with SOB. Then able to walk independently with RW and 2L, slow and steady. No major c/o. She is limited in distance at baseline due to her chronic back pain. Return to recliner. Encouraged pt and daughter to be up in room ad lib and walk hall with staff. [phone]   Dana [name] BS, ACSM-CEP 12/12/2022 12:44 PM

## 2022-12-12 NOTE — TOC Benefit Eligibility Note (Signed)
Patient Product/process development scientist completed.    The patient is currently admitted and upon discharge could be taking Brilinta 90 mg.  The current 30 day co-pay is $11.20.   The patient is insured through [employer] Part D   This test claim was processed through [facility]- copay amounts may vary at other pharmacies due to pharmacy/plan contracts, or as the patient moves through the different stages of their insurance plan.  Dana [name], CPHT Pharmacy Patient Advocate Specialist [facility] Patient Advocate Team Direct Number: [phone]  Fax: [phone]

## 2022-12-12 NOTE — Progress Notes (Signed)
PROGRESS NOTE    Dana [name]  ZOX:[id number] DOB: 10/28/1960 DOA: 12/06/2022 PCP: Blane Ohara, MD    Brief Narrative:  62 year old with history of coronary artery disease status post multiple stenting, A-fib status post ablation, mitral regurgitation with MitraClip, sleep apnea on CPAP, obesity, cardiac arrest, chronic respiratory failure with hypoxemia, CKD stage IIIb with baseline creatinine about 1.9, anemia, hypertension, hyperlipidemia, gout, GERD, anxiety, depression and diastolic dysfunction presented with midsternal chest pain mostly on the left side transiently improved with nitroglycerin but recurred.  In the emergency room hemodynamically stable.  Creatinine 2.73 from recent baseline of 1.9.  Troponins negative.  EKG nonischemic.  Due to recurrent chest pain, case discussed with cardiology who recommended ischemia evaluation and patient admitted to the hospital.  5/25, overnight with worsening chest pain and shortness of breath.  Taken urgently to cardiac cath and stented LAD.  Developed acute hypoxemia and needed BiPAP overnight. 5/26, some clinical improvement.  Epistaxis on heparin.  Improved after stopping heparin. 5/29.  Currently plan for HD on hold as the patient is making urine.   Assessment & Plan: Non-STEMI, Patient with extensive history of coronary artery disease, unstable angina: Patient had worsening chest pain after admission. 5/24 loaded with Brilinta and on nitro drip.  ST changes noted. Taken to cardiac cath emergently Drug-eluting stent proximal LAD to mid LAD lesion that was 90% stenosed. Patient does have other multiple areas of stenosis. Currently on aspirin, Brilinta.   Taken off nitroglycerin drip. Significant epistaxis on heparin, discontinued. Cardiology following.  Acute respiratory failure with hypoxemia, respiratory distress: Developed fluid overload, acute exacerbation of chronic diastolic congestive heart failure. Patient on oxygen and CPAP  at night at home. Using BiPAP in the hospital. Given oxygen to keep saturation more than 90%.  Followed by heart failure team.  Lasix challenge.  Acute kidney injury with history of chronic kidney disease stage IIIb: Baseline creatinine about 1.9.  Multifactorial AKI including recent use of Bactrim. Holding antihypertensives as well as torsemide. Creatinine now plateauing and trending down. Nephrology following, conservative therapy with close observation for now.  UTI: Recently diagnosed at urology office.  Cultures not available.  He started on Bactrim and long-term Keflex for prophylaxis. Reculture of the urine, pending Discontinue Bactrim.  Continue Keflex 500 mg 3 times daily for 5 days then she will go back on her chronic suppressive therapy 250 mg daily at bedtime.  Hyperlipidemia, on Zetia and Repatha Essential hypertension, Coreg and Imdur was continued.  Holding hydralazine and torsemide. A-fib, sinus rhythm post ablation. Mitral regurgitation post MitraClip. Type 2 diabetes, well-controlled.  Remains on long-acting insulin and sliding scale insulin. Sleep apnea, using CPAP.  BiPAP for respiratory distress. Gout, on home allopurinol. GERD, on PPI. Anxiety and depression, on BuSpar and Ativan.   DVT prophylaxis: Aspirin and Brilinta.  Heparin discontinued due to epistaxis.   Code Status: Full code Family Communication: None at the bedside today. Disposition Plan: Status is: Inpatient.  Active treatment of non-STEMI, respiratory distress, acute renal failure     Consultants:  Cardiology Nephrology  Procedures:  None  Antimicrobials:  Bactrim, discontinue Keflex 5 mg twice daily 5/24   Subjective: Breathing improving.  No nausea no vomiting.  Appetite still low.  Reports constipation.  Objective: Vitals:   12/12/22 0507 12/12/22 0726 12/12/22 1203 12/12/22 1556  BP: (!) 135/55 (!) 133/55 (!) 132/45 (!) 137/50  Pulse: 72 73 69 69  Resp: 18 16 16 18   Temp:  97.6 F (36.4 C) 98.1 F (  36.7 C) 98 F (36.7 C) 97.7 F (36.5 C)  TempSrc: Oral Oral Oral Oral  SpO2: 98% 94% 95% 95%  Weight: 105.5 kg     Height:        Intake/Output Summary (Last 24 hours) at 12/12/2022 1806 Last data filed at 12/12/2022 1607 Gross per 24 hour  Intake 243 ml  Output 3000 ml  Net -2757 ml    Filed Weights   12/10/22 0702 12/11/22 0436 12/12/22 0507  Weight: 106.4 kg 108.5 kg 105.5 kg    Examination: Basal crackles. S1-S2 present. Bowel sound present. Bilateral edema seen. No focal deficit.  Data Reviewed: I have personally reviewed following labs and imaging studies  CBC: Recent Labs  Lab 12/06/22 1045 12/07/22 0253 12/08/22 0159 12/09/22 0618 12/10/22 0231 12/11/22 0112 12/12/22 0154  WBC 17.3*   < > 17.2* 15.1* 21.1* 13.4* 11.8*  NEUTROABS 14.9*  --   --   --   --   --   --   HGB 9.3*   < > 8.9* 9.7* 8.4* 7.9* 7.7*  HCT 29.7*   < > 28.2* 30.4* 26.9* 24.7* 23.7*  MCV 84.9   < > 84.9 82.8 83.8 82.9 83.7  PLT 235   < > 238 242 211 194 214   < > = values in this interval not displayed.    Basic Metabolic Panel: Recent Labs  Lab 12/08/22 0159 12/09/22 0618 12/10/22 0231 12/11/22 0112 12/12/22 0154  NA 136 135 133* 133* 134*  K 4.1 3.8 4.1 3.8 3.8  CL 102 103 100 100 101  CO2 21* 20* 20* 20* 21*  GLUCOSE 184* 162* 154* 109* 174*  BUN 64* 56* 71* 79* 85*  CREATININE 2.89* 2.75* 3.70* 4.25* 4.26*  CALCIUM 8.4* 8.2* 8.2* 8.1* 8.2*  PHOS  --   --  4.2 4.7* 5.1*    GFR: Estimated Creatinine Clearance: 16.8 mL/min (A) (by C-G formula based on SCr of 4.26 mg/dL (H)). Liver Function Tests: Recent Labs  Lab 12/06/22 1045 12/10/22 0231 12/11/22 0112 12/12/22 0154  AST 17  --   --   --   ALT 18  --   --   --   ALKPHOS 139*  --   --   --   BILITOT 0.5  --   --   --   PROT 7.3  --   --   --   ALBUMIN 3.2* 2.7* 2.6* 2.5*    No results for input(s): "LIPASE", "AMYLASE" in the last 168 hours. No results for input(s): "AMMONIA" in  the last 168 hours. Coagulation Profile: No results for input(s): "INR", "PROTIME" in the last 168 hours. Cardiac Enzymes: No results for input(s): "CKTOTAL", "CKMB", "CKMBINDEX", "TROPONINI" in the last 168 hours. BNP (last 3 results) No results for input(s): "PROBNP" in the last 8760 hours. HbA1C: No results for input(s): "HGBA1C" in the last 72 hours. CBG: Recent Labs  Lab 12/11/22 1653 12/11/22 2109 12/12/22 0723 12/12/22 1200 12/12/22 1605  GLUCAP 166* 176* 146* 258* 189*    Lipid Profile: No results for input(s): "CHOL", "HDL", "LDLCALC", "TRIG", "CHOLHDL", "LDLDIRECT" in the last 72 hours. Thyroid Function Tests: No results for input(s): "TSH", "T4TOTAL", "FREET4", "T3FREE", "THYROIDAB" in the last 72 hours. Anemia Panel: Recent Labs    12/11/22 0112  TIBC 213*  IRON 15*    Sepsis Labs: Recent Labs  Lab 12/06/22 1456 12/06/22 1814  LATICACIDVEN 1.4 1.1     No results found for this or any previous visit (  from the past 240 hour(s)).       Radiology Studies: No results found.      Scheduled Meds:  allopurinol  300 mg Oral Daily   aspirin EC  81 mg Oral Daily   busPIRone  5 mg Oral TID   carvedilol  12.5 mg Oral BID   cephALEXin  250 mg Oral QHS   darbepoetin (ARANESP) injection - NON-DIALYSIS  150 mcg Subcutaneous Q Mon-1800   ezetimibe  10 mg Oral Daily   furosemide  80 mg Intravenous BID   insulin aspart  0-15 Units Subcutaneous TID WC   insulin aspart  0-5 Units Subcutaneous QHS   insulin detemir  30 Units Subcutaneous Daily   isosorbide mononitrate  60 mg Oral Daily   oxymetazoline  1 spray Each Nare BID   pantoprazole  40 mg Oral Daily   polyethylene glycol  17 g Oral Daily   sodium chloride flush  3 mL Intravenous Q12H   ticagrelor  90 mg Oral BID   Continuous Infusions:  sodium chloride Stopped (12/09/22 2245)   ferric gluconate (FERRLECIT) IVPB 250 mg (12/12/22 1253)   promethazine (PHENERGAN) injection (IM or IVPB) Stopped  (12/10/22 1448)   sodium chloride       LOS: 5 days    Time spent: 35 minutes  Author:  Lynden Oxford, MD [facility] 12/12/2022  6:08 PM   To reach On-call, see care teams to locate the attending and reach out to them via [email or web address]. If 7PM-7AM, please contact night-coverage If you still have difficulty reaching the attending provider, please page the [facility] (Director on Call) for [facility] on amion for assistance.

## 2022-12-12 NOTE — Progress Notes (Signed)
Advanced Heart Failure Rounding Note  PCP-Cardiologist: Arvilla Meres, MD   Subjective:   Yesterday diuresed with IV lasix. Brisk diuresis noted. 2.6 liters out. Weight down 7 pounds.   Lab Results  Component Value Date   CREATININE 4.26 (H) 12/12/2022   CREATININE 4.25 (H) 12/11/2022   CREATININE 3.70 (H) 12/10/2022  GFR 11.   Feels much more comfortable. Able to sleep. Denies SOB. Denies chest pain.  Concerned about dialysis.    Objective:   Weight Range: 105.5 kg Body mass index is 39.91 kg/m.   Vital Signs:   Temp:  [97.3 F (36.3 C)-98.2 F (36.8 C)] 98.1 F (36.7 C) (05/29 0726) Pulse Rate:  [71-80] 73 (05/29 0726) Resp:  [16-18] 16 (05/29 0726) BP: (121-142)/(48-60) 133/55 (05/29 0726) SpO2:  [91 %-98 %] 94 % (05/29 0726) Weight:  [105.5 kg] 105.5 kg (05/29 0507) Last BM Date : 12/07/22  Weight change: Filed Weights   12/10/22 0702 12/11/22 0436 12/12/22 0507  Weight: 106.4 kg 108.5 kg 105.5 kg    Intake/Output:   Intake/Output Summary (Last 24 hours) at 12/12/2022 0913 Last data filed at 12/12/2022 0981 Gross per 24 hour  Intake 243 ml  Output 2640 ml  Net -2397 ml      Physical Exam    General:  Sitting in the chair. No resp difficulty HEENT: Normal Neck: Supple. JVP 11-12   . Carotids 2+ bilat; no bruits. No lymphadenopathy or thyromegaly appreciated. Cor: PMI nondisplaced. Regular rate & rhythm. No rubs, gallops or murmurs. Lungs: Clear Abdomen: obese, soft, nontender, nondistended. No hepatosplenomegaly. No bruits or masses. Good bowel sounds. Extremities: No cyanosis, clubbing, rash,  compression edema Neuro: Alert & orientedx3, cranial nerves grossly intact. moves all 4 extremities w/o difficulty. Affect pleasant   Telemetry   SR 70s personally checked.  EKG   N/A   Labs    CBC Recent Labs    12/11/22 0112 12/12/22 0154  WBC 13.4* 11.8*  HGB 7.9* 7.7*  HCT 24.7* 23.7*  MCV 82.9 83.7  PLT 194 214   Basic Metabolic  Panel Recent Labs    12/11/22 0112 12/12/22 0154  NA 133* 134*  K 3.8 3.8  CL 100 101  CO2 20* 21*  GLUCOSE 109* 174*  BUN 79* 85*  CREATININE 4.25* 4.26*  CALCIUM 8.1* 8.2*  PHOS 4.7* 5.1*   Liver Function Tests Recent Labs    12/11/22 0112 12/12/22 0154  ALBUMIN 2.6* 2.5*   No results for input(s): "LIPASE", "AMYLASE" in the last 72 hours. Cardiac Enzymes No results for input(s): "CKTOTAL", "CKMB", "CKMBINDEX", "TROPONINI" in the last 72 hours.  BNP: BNP (last 3 results) Recent Labs    09/07/22 1013 12/06/22 1814 12/08/22 0159  BNP 168.7* 143.5* 149.2*    ProBNP (last 3 results) No results for input(s): "PROBNP" in the last 8760 hours.   D-Dimer No results for input(s): "DDIMER" in the last 72 hours. Hemoglobin A1C No results for input(s): "HGBA1C" in the last 72 hours. Fasting Lipid Panel No results for input(s): "CHOL", "HDL", "LDLCALC", "TRIG", "CHOLHDL", "LDLDIRECT" in the last 72 hours. Thyroid Function Tests No results for input(s): "TSH", "T4TOTAL", "T3FREE", "THYROIDAB" in the last 72 hours.  Invalid input(s): "FREET3"  Other results:   Imaging    No results found.   Medications:     Scheduled Medications:  allopurinol  300 mg Oral Daily   aspirin EC  81 mg Oral Daily   busPIRone  5 mg Oral TID   carvedilol  12.5 mg Oral BID   cephALEXin  250 mg Oral QHS   darbepoetin (ARANESP) injection - NON-DIALYSIS  150 mcg Subcutaneous Q Mon-1800   ezetimibe  10 mg Oral Daily   furosemide  80 mg Intravenous BID   insulin aspart  0-15 Units Subcutaneous TID WC   insulin aspart  0-5 Units Subcutaneous QHS   insulin detemir  30 Units Subcutaneous Daily   isosorbide mononitrate  60 mg Oral Daily    morphine injection  1 mg Intravenous Once   oxymetazoline  1 spray Each Nare BID   pantoprazole  40 mg Oral Daily   polyethylene glycol  17 g Oral Daily   sodium chloride flush  3 mL Intravenous Q12H   sodium chloride flush  3 mL Intravenous Q12H    ticagrelor  90 mg Oral BID    Infusions:  sodium chloride Stopped (12/09/22 2245)   promethazine (PHENERGAN) injection (IM or IVPB) Stopped (12/10/22 1448)   sodium chloride      PRN Medications: sodium chloride, acetaminophen, HYDROcodone-acetaminophen, lip balm, LORazepam, menthol-cetylpyridinium, nitroGLYCERIN, promethazine (PHENERGAN) injection (IM or IVPB), sodium chloride flush    Patient Profile   Dana [name] is 62 y.o. female with history of diastolic heart failure s/p cardiomems 02/2018, CAD, s/p DES RCA and LAD 2018, DMI, htn, hyperlipidemia, PAD, and OSA on CPAP, PAF s/p ablation, PAD s/p L great toe and 4th toe amputation, stage IIIb CKD.   Assessment/Plan   1. Chest Pain-->CAD-->NSTEM  H/P multiple PCI with stents to mid LAD and pRCA.  - NSTEMI (2/22). Cath deferred due to AKI. Treated medically.  - NSTEMI (7/22)-->S/p PCI/DES to mid Cx, balloon angioplasty to OM1  -LHC -->LAD DES, severe 85% ostial prox RCA will need eventual intervention but renal function precludes.  - On aspirin, coreg, brilinta - Allergy statins-->muscle pain.   oN zetia and PSK9i.    2. AKI Post cath with worsening renal function. Given 60 cc contrast.  Admit Creatinine 2.7-->today 4.26. BUN 85. GFR 11. Had similar events after cath in 2022 with Creatinine peaking at  5.  Nephrology following.  Avoid hypotension.    3. HFpEF , ICM  Has cardiomems.   Echo this admit EF 50-55%  RV mildy reduced.  - Looking back she had similar event in 2022 with gradual improvement. Yesterday she had bendopena/orthopnea. Given IV lasix with brisk diuresis. Repeat Reds today.  - Volume status looks better. - Continue current dose of lasix. Dialysis per Nephrology.  -Continue current coreg.  - Currently getting treated for UTI will need to avoid SGLT2i.    4. MR  2022 Had TEER- 1XT W and 1NT W clip on 05/18/2021. NT W clip was entangled and had to be deployed into lateral part of valve. No additional clip  placement could be preformed. Echo this admit-->Post mitral clip x 2 with XTW and NTW the latter entangled in chords no obvious SLD noted mean gradient 5 mmHg. Mild residual MR appears medial to the two clips    5. UTI  -On Keflex Hold off on SGLT2i   6. OSA Continue nightly CPAP   7. DMII On SSI   8. Anemia, chronic  Iron stats 7%. Will need IV Iron.     Length of Stay: 5  Dana Inabinet, NP  12/12/2022, 9:13 AM  Advanced Heart Failure Team Pager [phone] (M-F; 7a - 5p)  Please contact [facility] for night-coverage after hours (5p -7a ) and weekends on [email or web address]

## 2022-12-13 ENCOUNTER — Inpatient Hospital Stay: Payer: PPO

## 2022-12-13 ENCOUNTER — Encounter (HOSPITAL_COMMUNITY): Payer: Self-pay | Admitting: Cardiology

## 2022-12-13 DIAGNOSIS — I200 Unstable angina: Secondary | ICD-10-CM | POA: Diagnosis not present

## 2022-12-13 LAB — RENAL FUNCTION PANEL
Albumin: 2.8 g/dL — ABNORMAL LOW (ref 3.5–5.0)
Anion gap: 12 (ref 5–15)
BUN: 82 mg/dL — ABNORMAL HIGH (ref 6–20)
CO2: 26 mmol/L (ref 22–32)
Calcium: 8.6 mg/dL — ABNORMAL LOW (ref 8.9–10.3)
Chloride: 100 mmol/L (ref 98–111)
Creatinine, Ser: 3.49 mg/dL — ABNORMAL HIGH (ref 0.44–1.00)
GFR, Estimated: 14 mL/min — ABNORMAL LOW (ref >60)
Glucose, Bld: 139 mg/dL — ABNORMAL HIGH (ref 70–99)
Phosphorus: 4.8 mg/dL — ABNORMAL HIGH (ref 2.5–4.6)
Potassium: 4.1 mmol/L (ref 3.5–5.1)
Sodium: 138 mmol/L (ref 135–145)

## 2022-12-13 LAB — CBC
HCT: 27.6 % — ABNORMAL LOW (ref 36.0–46.0)
Hemoglobin: 8.5 g/dL — ABNORMAL LOW (ref 12.0–15.0)
MCH: 26.1 pg (ref 26.0–34.0)
MCHC: 30.8 g/dL (ref 30.0–36.0)
MCV: 84.7 fL (ref 80.0–100.0)
Platelets: 263 10*3/uL (ref 150–400)
RBC: 3.26 MIL/uL — ABNORMAL LOW (ref 3.87–5.11)
RDW: 15 % (ref 11.5–15.5)
WBC: 11.1 10*3/uL — ABNORMAL HIGH (ref 4.0–10.5)
nRBC: 0 % (ref 0.0–0.2)

## 2022-12-13 LAB — GLUCOSE, CAPILLARY
Glucose-Capillary: 102 mg/dL — ABNORMAL HIGH (ref 70–99)
Glucose-Capillary: 197 mg/dL — ABNORMAL HIGH (ref 70–99)
Glucose-Capillary: 187 mg/dL — ABNORMAL HIGH (ref 70–99)
Glucose-Capillary: 200 mg/dL — ABNORMAL HIGH (ref 70–99)

## 2022-12-13 LAB — MAGNESIUM: Magnesium: 2.3 mg/dL (ref 1.7–2.4)

## 2022-12-13 MED ORDER — LINACLOTIDE 145 MCG PO CAPS: 145.0000 ug | ORAL_CAPSULE | Freq: Every day | ORAL | Status: DC

## 2022-12-13 MED ORDER — TORSEMIDE 20 MG PO TABS: 80.0000 mg | ORAL_TABLET | Freq: Every day | ORAL | Status: DC

## 2022-12-13 MED ORDER — FUROSEMIDE 10 MG/ML IJ SOLN
80.0000 mg | Freq: Two times a day (BID) | INTRAMUSCULAR | Status: DC
  Administered 2022-12-13 – 2022-12-15 (×4): 80 mg via INTRAVENOUS
  Filled 2022-12-13 (×4): qty 8

## 2022-12-13 MED ORDER — LINACLOTIDE 145 MCG PO CAPS
145.0000 ug | ORAL_CAPSULE | Freq: Every day | ORAL | Status: DC
  Administered 2022-12-13 – 2022-12-16 (×4): 145 ug via ORAL
  Filled 2022-12-13 (×4): qty 1

## 2022-12-13 MED ORDER — FUROSEMIDE 10 MG/ML IJ SOLN: 80.0000 mg | Freq: Once | INTRAMUSCULAR | Status: DC

## 2022-12-13 NOTE — Progress Notes (Signed)
[facility] NEPHROLOGY PROGRESS NOTE  Assessment/ Plan: Pt is a 62 y.o. yo female  with CAD, chronic diastolic CHF, type I DM, HTN, HLD, PAD, OSA on CPAP, PAF status post ablation, PAD, CKD stage IIIb-IV, presented with chest pain/NSTEMI seen as a consultation for AKI on CKD with the need of cardiac cath.  For CKD, she follows with Dr. Elenore Rota at [facility] health for CKD IIIB-IV. Recent baseline creatinine level anywhere between 1.8-2.3 also history of proteinuria in the past however the RAAS blockers was held because of recurrent AKI.   # AKI on CKD IIIb-IV, b/l cr 1.8-2.3: Likely hemodynamically mediated due to combination of diuretics use, Bactrim and NSTEMI.  Urinalysis bland.   -s/p cardiac cath on 5/25 with minimal use of contrast.  -She was oliguric and creatinine level increased-  we had conversations and plan was to start HD in an effort to control volume more effectively -  bring bun down to help her feel better more quickly  However, then made good urine and numbers not worse-  she feels much better already.  So we put plans on hold to start dialysis and continue to attempt to manage her medically.  She understands this is still a tenuous situation.  If she fails to thrive either now or in future dialysis will be reconsidered.  She wishes to change her nephrology care to [facility]-  I can arrange.   Still with good UOP and numbers a little better-  clinically much better.  Plan to change diuretics to PO-  if numbers better still tomorrow we may be able to discharge and manage as OP    # NSTEMI - Cardiac cath on 5/25 with severe multivessel disease, in a standard restenosis status post balloon angioplasty.  She is clinically improved and on cardiac medication and anticoagulation per cardiology team. She needs another intervention at some point but have been told it is not urgent   # Chronic diastolic CHF: will change diuretics to her OP dosing of torsemide 80 daily In preparation for  possible discharge   # Anemia of CKD: She gets erythropoietin injection with her hematologist. Hb stable but low, redose ESA.  Iron also very low-  has an allergy to iron ?  She said hives but now giving and tolerating with premeds    # Urinary tract infection: It seems like she was started on Bactrim as a prophylaxis because of frequent UTI.  Discontinues Bactrim and switched to Keflex. Last urine without signs of infection   Subjective:   3400 of UOP-  BUN/crt a little better -  she is doing a lot better functionally today -  just constipated  Objective Vital signs in last 24 hours: Vitals:   12/12/22 2339 12/13/22 0508 12/13/22 0517 12/13/22 0821  BP: (!) 127/54  (!) 148/71 (!) 129/50  Pulse: 68  70   Resp: 18  18 16   Temp: 97.7 F (36.5 C)  97.8 F (36.6 C) 97.8 F (36.6 C)  TempSrc: Oral  Oral Oral  SpO2: 99%  98%   Weight:  104.3 kg    Height:       Weight change: -1.161 kg  Intake/Output Summary (Last 24 hours) at 12/13/2022 0940 Last data filed at 12/13/2022 0519 Gross per 24 hour  Intake 390.84 ml  Output 3450 ml  Net -3059.16 ml       Labs: RENAL PANEL Recent Labs  Lab 12/06/22 1045 12/07/22 0253 12/09/22 0618 12/10/22 0231 12/11/22 0112 12/12/22 0154  12/13/22 0330 12/13/22 0331  NA 135   < > 135 133* 133* 134*  --  138  K 4.1   < > 3.8 4.1 3.8 3.8  --  4.1  CL 99   < > 103 100 100 101  --  100  CO2 23   < > 20* 20* 20* 21*  --  26  GLUCOSE 213*   < > 162* 154* 109* 174*  --  139*  BUN 65*   < > 56* 71* 79* 85*  --  82*  CREATININE 2.73*   < > 2.75* 3.70* 4.25* 4.26*  --  3.49*  CALCIUM 8.8*   < > 8.2* 8.2* 8.1* 8.2*  --  8.6*  MG  --   --   --   --   --   --  2.3  --   PHOS  --   --   --  4.2 4.7* 5.1*  --  4.8*  ALBUMIN 3.2*  --   --  2.7* 2.6* 2.5*  --  2.8*   < > = values in this interval not displayed.    Liver Function Tests: Recent Labs  Lab 12/06/22 1045 12/10/22 0231 12/11/22 0112 12/12/22 0154 12/13/22 0331  AST 17  --   --   --    --   ALT 18  --   --   --   --   ALKPHOS 139*  --   --   --   --   BILITOT 0.5  --   --   --   --   PROT 7.3  --   --   --   --   ALBUMIN 3.2*   < > 2.6* 2.5* 2.8*   < > = values in this interval not displayed.   No results for input(s): "LIPASE", "AMYLASE" in the last 168 hours. No results for input(s): "AMMONIA" in the last 168 hours. CBC: Recent Labs    03/29/22 0955 04/25/22 0813 07/19/22 1529 08/07/22 1103 10/02/22 1043 10/02/22 1044 10/30/22 1053 12/09/22 0618 12/10/22 0231 12/11/22 0112 12/12/22 0154 12/13/22 0330  HGB  --    < >  --    < >  --  9.6*   < > 9.7* 8.4* 7.9* 7.7* 8.5*  MCV  --    < >  --    < >  --  89.0   < > 82.8 83.8 82.9 83.7 84.7  VITAMINB12 596  --  345  --  473  --   --   --   --   --   --   --   FOLATE 9.4  --  9.7  --   --  10.6  --   --   --   --   --   --   FERRITIN 141  --  108  --  93  --   --   --   --   --   --   --   TIBC 263  --  262  --  279  --   --   --   --  213*  --   --   IRON 52  --  35  --  34  --   --   --   --  15*  --   --    < > = values in this interval not displayed.    Cardiac Enzymes: No results for input(s): "CKTOTAL", "CKMB", "CKMBINDEX", "  TROPONINI" in the last 168 hours. CBG: Recent Labs  Lab 12/12/22 0723 12/12/22 1200 12/12/22 1605 12/12/22 2101 12/13/22 0816  GLUCAP 146* 258* 189* 167* 102*    Iron Studies:  Recent Labs    12/11/22 0112  IRON 15*  TIBC 213*   Studies/Results: No results found.  Medications: Infusions:  sodium chloride Stopped (12/09/22 2245)   ferric gluconate (FERRLECIT) IVPB 250 mg (12/13/22 0851)   promethazine (PHENERGAN) injection (IM or IVPB) Stopped (12/10/22 1448)   sodium chloride      Scheduled Medications:  allopurinol  300 mg Oral Daily   aspirin EC  81 mg Oral Daily   busPIRone  5 mg Oral TID   carvedilol  12.5 mg Oral BID   cephALEXin  250 mg Oral QHS   darbepoetin (ARANESP) injection - NON-DIALYSIS  150 mcg Subcutaneous Q Mon-1800   ezetimibe  10 mg  Oral Daily   furosemide  80 mg Intravenous BID   insulin aspart  0-15 Units Subcutaneous TID WC   insulin aspart  0-5 Units Subcutaneous QHS   insulin detemir  30 Units Subcutaneous Daily   isosorbide mononitrate  60 mg Oral Daily   pantoprazole  40 mg Oral Daily   polyethylene glycol  17 g Oral Daily   sodium chloride flush  3 mL Intravenous Q12H   ticagrelor  90 mg Oral BID    have reviewed scheduled and prn medications.  Physical Exam: General:NAD, comfortable Heart:RRR, s1s2 nl Lungs: Bibasal rhonchi, no wheeze Abdomen:soft, Non-tender, non-distended Extremities: Bilateral leg edema present but also generous size of legs at baseline-  overall better Neurology: Alert awake and following commands  Lashon Beringer A Caran Storck 12/13/2022,9:40 AM  LOS: 6 days

## 2022-12-13 NOTE — Progress Notes (Signed)
Pt placed on cpap 

## 2022-12-13 NOTE — Progress Notes (Signed)
CARDIAC REHAB PHASE I   PRE:  Rate/Rhythm: 74 NSR  BP:  Sitting: 137/65      SaO2: 93 1L  MODE:  Ambulation: 250 ft   AD:   rollator  POST:  Rate/Rhythm: 84 NSR  BP:  Sitting: 146/63      SaO2: 92 1L  Pt unable to tolerate RA, Sats 86-89 at rest on RA and mid 90s on 1L. Pt is very motivated to get stronger, recalling stories on how bad she felt while at home. Pt prefers rollator over RW and is interested in having rollator for home use.   Faustino Congress  ACSM-CEP 1:41 PM 12/13/2022    Service time is from 1310 to 1347.

## 2022-12-13 NOTE — Progress Notes (Signed)
Advanced Heart Failure Rounding Note  PCP-Cardiologist: Arvilla Meres, MD   Subjective:   Continues to diurese well with IV lasix. Brisk diuresis noted. 3.4 liters out. Weight down 3 pounds.   Lab Results  Component Value Date   CREATININE 3.49 (H) 12/13/2022   CREATININE 4.26 (H) 12/12/2022   CREATININE 4.25 (H) 12/11/2022  GFR 114.   Feels good this morning. Still some SOB yesterday when she walked but overall feels better. Denies CP.   Objective:   Weight Range: 104.3 kg Body mass index is 39.47 kg/m.   Vital Signs:   Temp:  [97.7 F (36.5 C)-98.1 F (36.7 C)] 97.8 F (36.6 C) (05/30 0517) Pulse Rate:  [68-74] 70 (05/30 0517) Resp:  [16-18] 18 (05/30 0517) BP: (125-148)/(45-85) 148/71 (05/30 0517) SpO2:  [92 %-99 %] 98 % (05/30 0517) Weight:  [104.3 kg] 104.3 kg (05/30 0508) Last BM Date : 12/08/22  Weight change: Filed Weights   12/11/22 0436 12/12/22 0507 12/13/22 0508  Weight: 108.5 kg 105.5 kg 104.3 kg    Intake/Output:   Intake/Output Summary (Last 24 hours) at 12/13/2022 0755 Last data filed at 12/13/2022 0519 Gross per 24 hour  Intake 390.84 ml  Output 3450 ml  Net -3059.16 ml      Physical Exam  General:  well appearing.  No respiratory difficulty HEENT: normal Neck: supple. JVD ~12 cm. Carotids 2+ bilat; no bruits. No lymphadenopathy or thyromegaly appreciated. Cor: PMI nondisplaced. Regular rate & rhythm. No rubs, gallops or murmurs. Lungs: clear Abdomen: obese, soft, nontender, nondistended. No hepatosplenomegaly. No bruits or masses. Good bowel sounds. Extremities: no cyanosis, clubbing, rash, trace BLE edema. + compression hose Neuro: alert & oriented x 3, cranial nerves grossly intact. moves all 4 extremities w/o difficulty. Affect pleasant.  Telemetry   NSR 70s (Personally reviewed)    EKG   N/A   Labs    CBC Recent Labs    12/12/22 0154 12/13/22 0330  WBC 11.8* 11.1*  HGB 7.7* 8.5*  HCT 23.7* 27.6*  MCV 83.7 84.7   PLT 214 263   Basic Metabolic Panel Recent Labs    16/10/96 0154 12/13/22 0330 12/13/22 0331  NA 134*  --  138  K 3.8  --  4.1  CL 101  --  100  CO2 21*  --  26  GLUCOSE 174*  --  139*  BUN 85*  --  82*  CREATININE 4.26*  --  3.49*  CALCIUM 8.2*  --  8.6*  MG  --  2.3  --   PHOS 5.1*  --  4.8*   Liver Function Tests Recent Labs    12/12/22 0154 12/13/22 0331  ALBUMIN 2.5* 2.8*   No results for input(s): "LIPASE", "AMYLASE" in the last 72 hours. Cardiac Enzymes No results for input(s): "CKTOTAL", "CKMB", "CKMBINDEX", "TROPONINI" in the last 72 hours.  BNP: BNP (last 3 results) Recent Labs    09/07/22 1013 12/06/22 1814 12/08/22 0159  BNP 168.7* 143.5* 149.2*    ProBNP (last 3 results) No results for input(s): "PROBNP" in the last 8760 hours.   D-Dimer No results for input(s): "DDIMER" in the last 72 hours. Hemoglobin A1C No results for input(s): "HGBA1C" in the last 72 hours. Fasting Lipid Panel No results for input(s): "CHOL", "HDL", "LDLCALC", "TRIG", "CHOLHDL", "LDLDIRECT" in the last 72 hours. Thyroid Function Tests No results for input(s): "TSH", "T4TOTAL", "T3FREE", "THYROIDAB" in the last 72 hours.  Invalid input(s): "FREET3"  Other results:   Imaging  No results found.   Medications:     Scheduled Medications:  allopurinol  300 mg Oral Daily   aspirin EC  81 mg Oral Daily   busPIRone  5 mg Oral TID   carvedilol  12.5 mg Oral BID   cephALEXin  250 mg Oral QHS   darbepoetin (ARANESP) injection - NON-DIALYSIS  150 mcg Subcutaneous Q Mon-1800   ezetimibe  10 mg Oral Daily   furosemide  80 mg Intravenous BID   insulin aspart  0-15 Units Subcutaneous TID WC   insulin aspart  0-5 Units Subcutaneous QHS   insulin detemir  30 Units Subcutaneous Daily   isosorbide mononitrate  60 mg Oral Daily   pantoprazole  40 mg Oral Daily   polyethylene glycol  17 g Oral Daily   sodium chloride flush  3 mL Intravenous Q12H   ticagrelor  90 mg  Oral BID    Infusions:  sodium chloride Stopped (12/09/22 2245)   ferric gluconate (FERRLECIT) IVPB Stopped (12/12/22 1456)   promethazine (PHENERGAN) injection (IM or IVPB) Stopped (12/10/22 1448)   sodium chloride      PRN Medications: sodium chloride, acetaminophen, bisacodyl, diphenhydrAMINE, HYDROcodone-acetaminophen, lip balm, LORazepam, menthol-cetylpyridinium, nitroGLYCERIN, oxymetazoline, promethazine (PHENERGAN) injection (IM or IVPB), sodium chloride flush    Patient Profile   Dana [name] is 62 y.o. female with history of diastolic heart failure s/p cardiomems 02/2018, CAD, s/p DES RCA and LAD 2018, DMI, htn, hyperlipidemia, PAD, and OSA on CPAP, PAF s/p ablation, PAD s/p L great toe and 4th toe amputation, stage IIIb CKD.   Assessment/Plan  1. Chest Pain-->CAD-->NSTEM  H/P multiple PCI with stents to mid LAD and pRCA.  - NSTEMI (2/22). Cath deferred due to AKI. Treated medically.  - NSTEMI (7/22)-->S/p PCI/DES to mid Cx, balloon angioplasty to OM1  - LHC -->LAD DES, severe 85% ostial prox RCA will need eventual intervention but renal function precludes.  - On aspirin, coreg, brilinta - Allergy statins-->muscle pain.   oN zetia and PSK9i.    2. AKI Post cath with worsening renal function. Given 60 cc contrast.  Admit Creatinine 2.7-->today 3.49. BUN 82. GFR 14. Had similar events after cath in 2022 with SCr peaking at 5.  Nephrology following, holding off on HD for now.  Avoid hypotension.    3. HFpEF , ICM  Has cardiomems.   Echo this admit EF 50-55%  RV mildy reduced.  - Looking back she had similar event in 2022 with gradual improvement.  - 5/28 she had bendopena/orthopnea, now resolved. - Volume status continues to improve. - Continue 80mg  IV lasix BID, still needs more fluid removal. Renal function improving. Dialysis per Nephrology, holding for now with decent UOP.  - Continue coreg 12.5 mg BID.  - Currently getting treated for UTI will need to avoid SGLT2i.     4. MR  2022 Had TEER- 1XT W and 1NT W clip on 05/18/2021. NT W clip was entangled and had to be deployed into lateral part of valve. No additional clip placement could be preformed. Echo this admit-->Post mitral clip x 2 with XTW and NTW the latter entangled in chords no obvious SLD noted mean gradient 5 mmHg. Mild residual MR appears medial to the two clips    5. UTI  -On Keflex Hold off on SGLT2i   6. OSA Continue nightly CPAP   7. DMII On SSI   8. Anemia, chronic  Iron stats 7%. S/p IV iron 5/29    Length of Stay: 6  Alen Bleacher, NP  12/13/2022, 7:55 AM  Advanced Heart Failure Team Pager [phone] (M-F; 7a - 5p)  Please contact [facility] for night-coverage after hours (5p -7a ) and weekends on [email or web address]

## 2022-12-13 NOTE — Progress Notes (Addendum)
CARDIAC REHAB PHASE I   PRE:  Rate/Rhythm: 75 NSR  BP:  Sitting: 127/51      SaO2: 95 1L  MODE:  Ambulation: 220 ft   AD:   rollator  POST:  Rate/Rhythm: 94 NSR  BP:  Sitting: 134/63      SaO2: 93 1L  Pt amb with 1L of O2 and IV stand requiring standby assistance, pt denies CP and minimal-moderate SOB during amb and was returned to room w/o complaint. Sats stable on 1L, 92%, working towards RA.  Faustino Congress  ACSM-CEP 10:03 AM 12/13/2022    Service time is from 0923 to 1006.

## 2022-12-13 NOTE — Progress Notes (Signed)
[facility] Progress Note Patient: Dana [name]:[id number] DOB: March 03, 1961 DOA: 12/06/2022  DOS: the patient was seen and examined on 12/13/2022  Brief hospital course: 62 year old PMH of CAD SP PCI, PAF SP ablation, MR S/P MitraClip, OSA on CPAP, obesity, chronic respiratory failure, CKD stage IIIb with baseline creatinine about 1.9, anemia, HTN, HLD, gout, GERD, anxiety, depression, chronic HFpEF. Presents with complaints of chest pain.  Found to have AKI on CKD 3B.  Also non-STEMI with 85% ostial proximal RCA stenosis Cardiology, nephrology, advanced heart failure services consulted. Assessment and Plan: Non-STEMI CAD requiring PCI In-stent restenosis of mid LAD Patient with extensive history of coronary artery disease, unstable angina: Patient had worsening chest pain after admission. 5/24 loaded with Brilinta and on nitro drip.  ST changes noted. Taken to cardiac cath emergently.  Found to have ISR in the proximal and mid LAD treated with balloon angioplasty. Also roughly 85% ostial proximal RCA stenosis which will require staged intervention once renal function is better. Known unchanged stent occlusion of LCx after OM1. Currently on aspirin, Brilinta.  Imdur 60 mg daily. Cardiology following.   Acute on chronic respiratory failure with hypoxemia, respiratory distress: Secondary to acute on chronic diastolic CHF. Patient on oxygen and CPAP at night at home. Using BiPAP in the hospital. Given oxygen to keep saturation more than 90%.  Followed by heart failure team.   AKI with CKD 3B. Baseline creatinine 1.9. Multifactorial AKI including recent use of Bactrim, contrast used for catheterization Nephrology following, conservative therapy with close observation for now. Briefly considered HD although patient now making significant amount of urine with improvement in serum creatinine therefore currently HD held.   UTI: Recently diagnosed at urology office.  Cultures not  available.  He started on Bactrim and long-term Keflex for prophylaxis. Completed Keflex 500 mg 3 times daily for 5 days and now she is back on her chronic suppressive therapy 250 mg daily at bedtime.   Hyperlipidemia, on Zetia and Repatha  Essential hypertension,  Coreg and Imdur was continued.  Holding hydralazine and torsemide.  A-fib, sinus rhythm post ablation. Mitral regurgitation post MitraClip. Type 2 diabetes, well-controlled with long-term insulin use with gastroparesis. Remains on long-acting insulin and sliding scale insulin.  Sleep apnea, using CPAP.  Currently BiPAP for respiratory distress.  Gout,  on home allopurinol.  Dose adjusted for renal function.  GERD,  on PPI.  Anxiety and depression,  on BuSpar and Ativan.  Chronic constipation. Resume Linzess. Continue current bowel regimen.  Obesity Body mass index is 39.47 kg/m.  Placing the pt at higher risk of poor outcomes.   Subjective: Continues to have shortness of breath although feeling better.  No nausea no vomiting.  Ambulation distance improving.  Swelling in the leg improving.  Physical Exam: General: in Mild distress, No Rash Cardiovascular: S1 and S2 Present, No Murmur Respiratory: Good respiratory effort, Bilateral Air entry present.  Bilateral basal crackles, No wheezes Abdomen: Bowel Sound present, No tenderness Extremities: Mild edema Neuro: Alert and oriented x3, no new focal deficit  Data Reviewed: I have Reviewed nursing notes, Vitals, and Lab results. Since last encounter, pertinent lab results CBC and BMP   . I have ordered test including CBC BMP magnesium  . I have discussed pt's care plan and test results with cardiology  .  Disposition: Status is: Inpatient Remains inpatient appropriate because: Need for IV diuresis  Place and maintain sequential compression device Start: 12/13/22 0853   Family Communication: Daughter at bedside Level of  care: Progressive   Vitals:    12/13/22 0508 12/13/22 0517 12/13/22 0821 12/13/22 1200  BP:  (!) 148/71 (!) 129/50   Pulse:  70    Resp:  18 16 18   Temp:  97.8 F (36.6 C) 97.8 F (36.6 C) 97.7 F (36.5 C)  TempSrc:  Oral Oral Oral  SpO2:  98%    Weight: 104.3 kg     Height:         Author: Lynden Oxford, MD 12/13/2022 4:54 PM  Please look on [email or web address] to find out who is on call.

## 2022-12-13 NOTE — Hospital Course (Addendum)
62 year old PMH of CAD SP PCI, PAF SP ablation, MR S/P MitraClip, OSA on CPAP, obesity, chronic respiratory failure, CKD stage IIIb with baseline creatinine about 1.9, anemia, HTN, HLD, gout, GERD, anxiety, depression, chronic HFpEF. Presents with complaints of chest pain.  Found to have AKI on CKD 3B.  Also non-STEMI with 85% ostial proximal RCA stenosis Cardiology, nephrology, advanced heart failure services consulted.

## 2022-12-14 ENCOUNTER — Inpatient Hospital Stay (HOSPITAL_COMMUNITY): Payer: PPO

## 2022-12-14 DIAGNOSIS — E1159 Type 2 diabetes mellitus with other circulatory complications: Secondary | ICD-10-CM | POA: Diagnosis not present

## 2022-12-14 DIAGNOSIS — I10 Essential (primary) hypertension: Secondary | ICD-10-CM | POA: Diagnosis not present

## 2022-12-14 DIAGNOSIS — Z794 Long term (current) use of insulin: Secondary | ICD-10-CM | POA: Diagnosis not present

## 2022-12-14 DIAGNOSIS — I200 Unstable angina: Secondary | ICD-10-CM | POA: Diagnosis not present

## 2022-12-14 DIAGNOSIS — F32A Depression, unspecified: Secondary | ICD-10-CM | POA: Diagnosis not present

## 2022-12-14 LAB — RENAL FUNCTION PANEL
Albumin: 2.8 g/dL — ABNORMAL LOW (ref 3.5–5.0)
Anion gap: 12 (ref 5–15)
BUN: 80 mg/dL — ABNORMAL HIGH (ref 6–20)
CO2: 23 mmol/L (ref 22–32)
Calcium: 8.5 mg/dL — ABNORMAL LOW (ref 8.9–10.3)
Chloride: 100 mmol/L (ref 98–111)
Creatinine, Ser: 2.82 mg/dL — ABNORMAL HIGH (ref 0.44–1.00)
GFR, Estimated: 19 mL/min — ABNORMAL LOW (ref >60)
Glucose, Bld: 151 mg/dL — ABNORMAL HIGH (ref 70–99)
Phosphorus: 3.4 mg/dL (ref 2.5–4.6)
Potassium: 3.7 mmol/L (ref 3.5–5.1)
Sodium: 135 mmol/L (ref 135–145)

## 2022-12-14 LAB — GLUCOSE, CAPILLARY
Glucose-Capillary: 124 mg/dL — ABNORMAL HIGH (ref 70–99)
Glucose-Capillary: 212 mg/dL — ABNORMAL HIGH (ref 70–99)
Glucose-Capillary: 144 mg/dL — ABNORMAL HIGH (ref 70–99)
Glucose-Capillary: 186 mg/dL — ABNORMAL HIGH (ref 70–99)
Glucose-Capillary: 176 mg/dL — ABNORMAL HIGH (ref 70–99)

## 2022-12-14 LAB — CBC
HCT: 27 % — ABNORMAL LOW (ref 36.0–46.0)
Hemoglobin: 8.7 g/dL — ABNORMAL LOW (ref 12.0–15.0)
MCH: 26.8 pg (ref 26.0–34.0)
MCHC: 32.2 g/dL (ref 30.0–36.0)
MCV: 83.1 fL (ref 80.0–100.0)
Platelets: 287 10*3/uL (ref 150–400)
RBC: 3.25 MIL/uL — ABNORMAL LOW (ref 3.87–5.11)
RDW: 14.9 % (ref 11.5–15.5)
WBC: 10.6 10*3/uL — ABNORMAL HIGH (ref 4.0–10.5)
nRBC: 0 % (ref 0.0–0.2)

## 2022-12-14 LAB — MAGNESIUM: Magnesium: 2.1 mg/dL (ref 1.7–2.4)

## 2022-12-14 MED ORDER — POTASSIUM CHLORIDE CRYS ER 20 MEQ PO TBCR
20.0000 meq | EXTENDED_RELEASE_TABLET | Freq: Once | ORAL | Status: AC
  Administered 2022-12-14: 20 meq via ORAL
  Filled 2022-12-14: qty 1

## 2022-12-14 MED ORDER — FLEET ENEMA 7-19 GM/118ML RE ENEM
1.0000 | ENEMA | Freq: Once | RECTAL | Status: AC
  Administered 2022-12-14: 1 via RECTAL
  Filled 2022-12-14: qty 1

## 2022-12-14 MED ORDER — ONDANSETRON HCL 4 MG/2ML IJ SOLN
4.0000 mg | Freq: Four times a day (QID) | INTRAMUSCULAR | Status: DC | PRN
  Administered 2022-12-14: 4 mg via INTRAVENOUS
  Filled 2022-12-14: qty 2

## 2022-12-14 MED ORDER — BISACODYL 10 MG RE SUPP: 10.0000 mg | Freq: Every day | RECTAL | Status: DC

## 2022-12-14 MED ORDER — PROCHLORPERAZINE EDISYLATE 10 MG/2ML IJ SOLN
10.0000 mg | Freq: Four times a day (QID) | INTRAMUSCULAR | Status: DC | PRN
  Administered 2022-12-14 – 2022-12-15 (×2): 10 mg via INTRAVENOUS
  Filled 2022-12-14 (×2): qty 2

## 2022-12-14 NOTE — Progress Notes (Signed)
[facility] Progress Note Patient: Dana [name] ZOX:[id number] DOB: [birth year] DOA: 12/06/2022  DOS: the patient was seen and examined on 12/14/2022  Brief hospital course: 62 year old PMH of CAD SP PCI, PAF SP ablation, MR S/P MitraClip, OSA on CPAP, obesity, chronic respiratory failure, CKD stage IIIb with baseline creatinine about 1.9, anemia, HTN, HLD, gout, GERD, anxiety, depression, chronic HFpEF. Presents with complaints of chest pain.  Found to have AKI on CKD 3B.  Also non-STEMI with 85% ostial proximal RCA stenosis Cardiology, nephrology, advanced heart failure services consulted. Assessment and Plan: Non-STEMI CAD requiring PCI In-stent restenosis of mid LAD Patient with extensive history of coronary artery disease, unstable angina: Patient had worsening chest pain after admission. 5/24 loaded with Brilinta and on nitro drip.  ST changes noted. Taken to cardiac cath emergently.  Found to have ISR in the proximal and mid LAD treated with balloon angioplasty. Also roughly 85% ostial proximal RCA stenosis which will require staged intervention once renal function is better. Known unchanged stent occlusion of LCx after OM1. Currently on aspirin, Brilinta. Imdur 60 mg daily.  Cardiology following.   Acute on chronic respiratory failure with hypoxemia, respiratory distress: Secondary to acute on chronic diastolic CHF. Patient on oxygen and CPAP at night at home. Using BiPAP in the hospital. Given oxygen to keep saturation more than 90%.  Followed by heart failure team.   AKI with CKD 3B. Baseline creatinine 1.9. Multifactorial AKI including recent use of Bactrim, contrast used for catheterization Nephrology following, conservative therapy with close observation for now. Briefly considered HD although patient now making significant amount of urine with improvement in serum creatinine therefore currently HD held.   UTI: Recently diagnosed at urology office.  Cultures not  available.  He started on Bactrim and long-term Keflex for prophylaxis. Completed Keflex 500 mg 3 times daily for 5 days and now she is back on her chronic suppressive therapy 250 mg daily at bedtime.   Hyperlipidemia, on Zetia and Repatha  Essential hypertension,  Coreg and Imdur was continued.  Holding hydralazine and torsemide.  A-fib, sinus rhythm post ablation. Mitral regurgitation post MitraClip. Type 2 diabetes, well-controlled with long-term insulin use with gastroparesis. Remains on long-acting insulin and sliding scale insulin.  Sleep apnea, using CPAP.  Currently BiPAP for respiratory distress.  Gout,  on home allopurinol.  Dose adjusted for renal function.  GERD,  on PPI.  Anxiety and depression,  on BuSpar and Ativan.  Chronic constipation. Resume Linzess. Continue current bowel regimen. I discussed with the patient that with her CKD Milk of Magnesia or fleets will be contraindicated.  And ordered soapsuds enema. Patient received fleets enema ordered by cardiology.  I have not authorized this medication. X-ray abdomen shows moderate stool.  No blockage.  Had a BM after enema.  Obesity Body mass index is 39.82 kg/m.  Placing the pt at higher risk of poor outcomes.   Subjective: Breathing improved.  No nausea no vomiting.  Now off the oxygen.  Abdominal pain as well as nausea reported.  Still constipated.  Passing gas.  Physical Exam: In moderate distress.  No rash. Clear to auscultation. S1-S2 present. Bilateral edema.  Data Reviewed: I have Reviewed nursing notes, Vitals, and Lab results. Reviewed CBC and BMP.  Reordered CBC and BMP as well as x-ray abdomen.  Disposition: Status is: Inpatient Remains inpatient appropriate because: Need for IV diuresis  Place TED hose Start: 12/14/22 1608 Place and maintain sequential compression device Start: 12/13/22 0853   Family Communication:  Daughter at bedside Level of care: Telemetry Cardiac   Vitals:    12/14/22 0329 12/14/22 0829 12/14/22 1151 12/14/22 1609  BP: (!) 130/59 (!) 120/50 (!) 126/42 (!) 126/50  Pulse: 67 77 71 73  Resp: 19  16 16   Temp: 97.6 F (36.4 C)  97.9 F (36.6 C) 97.6 F (36.4 C)  TempSrc: Oral  Oral Oral  SpO2: 96%  94% 92%  Weight: 105.2 kg     Height:         Author: Lynden Oxford, MD 12/14/2022 5:04 PM  Please look on [email or web address] to find out who is on call.

## 2022-12-14 NOTE — Progress Notes (Signed)
[name]  Assessment/ Plan: Pt is a 62 y.o. yo female  with CAD, chronic diastolic CHF, type I DM, HTN, HLD, PAD, OSA on CPAP, PAF status post ablation, PAD, CKD stage IIIb-IV, presented with chest pain/NSTEMI seen as a consultation for AKI on CKD with the need of cardiac cath.  For CKD, she follows with Dr. Elenore Rota at [facility] for CKD IIIB-IV. Recent baseline creatinine level anywhere between 1.8-2.3 also history of proteinuria in the past however the RAAS blockers was held because of recurrent AKI.   # AKI on CKD IIIb-IV, b/l cr 1.8-2.3: Likely hemodynamically mediated due to combination of diuretics use, Bactrim and NSTEMI.  Urinalysis bland.   -s/p cardiac cath on 5/25 with minimal use of contrast.  -She was oliguric and creatinine level increased-  we had conversations and plan was to start HD in an effort to control volume more effectively -  bring bun down to help her feel better more quickly  However, then made good urine and numbers not worse-  she felt better already.  So we put plans on hold to start dialysis and continue to attempt to manage her medically.  She understands this is still a tenuous situation.  If she fails to thrive either now or in future dialysis will be reconsidered.  She wishes to change her nephrology care to [facility]-  I can arrange upon discharge.   Still with good UOP and numbers a little better still  clinically much better.  Had changed diuretics to PO but cardiology changed back to IV   # NSTEMI - Cardiac cath on 5/25 with severe multivessel disease, in a standard restenosis status post balloon angioplasty.  She is clinically improved and on cardiac medication and anticoagulation per cardiology team. She needs another intervention at some point but have been told it is not urgent   # Chronic diastolic CHF: had changed diuretics to her OP dosing of torsemide 80 daily In preparation for possible discharge-  were changed back  per heart failure   # Anemia of CKD: She gets erythropoietin injection with her hematologist. Hb stable but low, redosed ESA.  Iron also very low-  has an allergy to iron ?  She said hives but now giving and tolerating with premeds    # Urinary tract infection: It seems like she was started on Bactrim as a prophylaxis because of frequent UTI.  Discontinues Bactrim and switched to Keflex. Last urine without signs of   FYI-  I would be fine to follow the kidney part of this as an OP   Subjective:   1700 of UOP-  BUN/crt a little better still-  she is doing a lot better functionally today -  just constipated-  still has not had BM  Objective Vital signs in last 24 hours: Vitals:   12/13/22 2000 12/13/22 2114 12/14/22 0329 12/14/22 0829  BP: (!) 137/58 (!) 137/58 (!) 130/59 (!) 120/50  Pulse: 67 75 67 77  Resp:   19   Temp:   97.6 F (36.4 C)   TempSrc:   Oral   SpO2: 93%  96%   Weight:   105.2 kg   Height:       Weight change: 0.934 kg  Intake/Output Summary (Last 24 hours) at 12/14/2022 0923 Last data filed at 12/14/2022 2956 Gross per 24 hour  Intake 250 ml  Output 1750 ml  Net -1500 ml       Labs: RENAL PANEL Recent  Labs  Lab 12/10/22 0231 12/11/22 0112 12/12/22 0154 12/13/22 0330 12/13/22 0331 12/14/22 0117  NA 133* 133* 134*  --  138 135  K 4.1 3.8 3.8  --  4.1 3.7  CL 100 100 101  --  100 100  CO2 20* 20* 21*  --  26 23  GLUCOSE 154* 109* 174*  --  139* 151*  BUN 71* 79* 85*  --  82* 80*  CREATININE 3.70* 4.25* 4.26*  --  3.49* 2.82*  CALCIUM 8.2* 8.1* 8.2*  --  8.6* 8.5*  MG  --   --   --  2.3  --  2.1  PHOS 4.2 4.7* 5.1*  --  4.8* 3.4  ALBUMIN 2.7* 2.6* 2.5*  --  2.8* 2.8*    Liver Function Tests: Recent Labs  Lab 12/12/22 0154 12/13/22 0331 12/14/22 0117  ALBUMIN 2.5* 2.8* 2.8*   No results for input(s): "LIPASE", "AMYLASE" in the last 168 hours. No results for input(s): "AMMONIA" in the last 168 hours. CBC: Recent Labs    03/29/22 0955  04/25/22 0813 07/19/22 1529 08/07/22 1103 10/02/22 1043 10/02/22 1044 10/30/22 1053 12/10/22 0231 12/11/22 0112 12/12/22 0154 12/13/22 0330 12/14/22 0117  HGB  --    < >  --    < >  --  9.6*   < > 8.4* 7.9* 7.7* 8.5* 8.7*  MCV  --    < >  --    < >  --  89.0   < > 83.8 82.9 83.7 84.7 83.1  VITAMINB12 596  --  345  --  473  --   --   --   --   --   --   --   FOLATE 9.4  --  9.7  --   --  10.6  --   --   --   --   --   --   FERRITIN 141  --  108  --  93  --   --   --   --   --   --   --   TIBC 263  --  262  --  279  --   --   --  213*  --   --   --   IRON 52  --  35  --  34  --   --   --  15*  --   --   --    < > = values in this interval not displayed.    Cardiac Enzymes: No results for input(s): "CKTOTAL", "CKMB", "CKMBINDEX", "TROPONINI" in the last 168 hours. CBG: Recent Labs  Lab 12/13/22 0816 12/13/22 1157 12/13/22 1550 12/13/22 2107 12/14/22 0731  GLUCAP 102* 197* 187* 200* 124*    Iron Studies:  No results for input(s): "IRON", "TIBC", "TRANSFERRIN", "FERRITIN" in the last 72 hours.  Studies/Results: No results found.  Medications: Infusions:  sodium chloride Stopped (12/09/22 2245)   ferric gluconate (FERRLECIT) IVPB Stopped (12/14/22 0851)   promethazine (PHENERGAN) injection (IM or IVPB) Stopped (12/10/22 1448)    Scheduled Medications:  allopurinol  300 mg Oral Daily   aspirin EC  81 mg Oral Daily   busPIRone  5 mg Oral TID   carvedilol  12.5 mg Oral BID   cephALEXin  250 mg Oral QHS   darbepoetin (ARANESP) injection - NON-DIALYSIS  150 mcg Subcutaneous Q Mon-1800   ezetimibe  10 mg Oral Daily   furosemide  80 mg Intravenous BID  insulin aspart  0-15 Units Subcutaneous TID WC   insulin aspart  0-5 Units Subcutaneous QHS   insulin detemir  30 Units Subcutaneous Daily   isosorbide mononitrate  60 mg Oral Daily   linaclotide  145 mcg Oral QAC breakfast   pantoprazole  40 mg Oral Daily   polyethylene glycol  17 g Oral Daily   sodium chloride flush   3 mL Intravenous Q12H   ticagrelor  90 mg Oral BID    have reviewed scheduled and prn medications.  Physical Exam: General:NAD, comfortable Heart:RRR, s1s2 nl Lungs: Bibasal rhonchi, no wheeze Abdomen:soft, Non-tender, non-distended Extremities: Bilateral leg edema present but also generous size of legs at baseline-  overall better Neurology: Alert awake and following commands  Dana [name] A Dana [name] 12/14/2022,9:23 AM  LOS: 7 days

## 2022-12-14 NOTE — Progress Notes (Signed)
Advanced Heart Failure Rounding Note  PCP-Cardiologist: Arvilla Meres, MD   Subjective:    Made 1.8L in UOP yesterday.   Scr trending down, 4.26>>3.49>>2.82. BUN unchanged at 80.   Denies CP. No dyspnea. Main complaint is constipation. No BM x 7 days. Feels compacted.    Objective:   Weight Range: 105.2 kg Body mass index is 39.82 kg/m.   Vital Signs:   Temp:  [97.6 F (36.4 C)-97.8 F (36.6 C)] 97.6 F (36.4 C) (05/31 0329) Pulse Rate:  [67-77] 77 (05/31 0829) Resp:  [18-19] 19 (05/31 0329) BP: (120-137)/(50-59) 120/50 (05/31 0829) SpO2:  [92 %-96 %] 96 % (05/31 0329) FiO2 (%):  [28 %] 28 % (05/30 2334) Weight:  [105.2 kg] 105.2 kg (05/31 0329) Last BM Date : 12/08/22  Weight change: Filed Weights   12/12/22 0507 12/13/22 0508 12/14/22 0329  Weight: 105.5 kg 104.3 kg 105.2 kg    Intake/Output:   Intake/Output Summary (Last 24 hours) at 12/14/2022 0953 Last data filed at 12/14/2022 0620 Gross per 24 hour  Intake 250 ml  Output 1750 ml  Net -1500 ml      Physical Exam   General:  Well appearing. No respiratory difficulty HEENT: normal Neck: supple. Thick neck, JVD not well visualized Carotids 2+ bilat; no bruits. No lymphadenopathy or thyromegaly appreciated. Cor: PMI nondisplaced. Regular rate & rhythm. No rubs, gallops or murmurs. Lungs: clear Abdomen: obese, soft, nontender, nondistended. No hepatosplenomegaly. No bruits or masses. Good bowel sounds. Extremities: no cyanosis, clubbing, rash, chronic LE edema Neuro: alert & oriented x 3, cranial nerves grossly intact. moves all 4 extremities w/o difficulty. Affect pleasant.  Telemetry   NSR 70s (Personally reviewed)    EKG   N/A  Labs    CBC Recent Labs    12/13/22 0330 12/14/22 0117  WBC 11.1* 10.6*  HGB 8.5* 8.7*  HCT 27.6* 27.0*  MCV 84.7 83.1  PLT 263 287   Basic Metabolic Panel Recent Labs    40/98/11 0330 12/13/22 0331 12/14/22 0117  NA  --  138 135  K  --  4.1 3.7   CL  --  100 100  CO2  --  26 23  GLUCOSE  --  139* 151*  BUN  --  82* 80*  CREATININE  --  3.49* 2.82*  CALCIUM  --  8.6* 8.5*  MG 2.3  --  2.1  PHOS  --  4.8* 3.4   Liver Function Tests Recent Labs    12/13/22 0331 12/14/22 0117  ALBUMIN 2.8* 2.8*   No results for input(s): "LIPASE", "AMYLASE" in the last 72 hours. Cardiac Enzymes No results for input(s): "CKTOTAL", "CKMB", "CKMBINDEX", "TROPONINI" in the last 72 hours.  BNP: BNP (last 3 results) Recent Labs    09/07/22 1013 12/06/22 1814 12/08/22 0159  BNP 168.7* 143.5* 149.2*    ProBNP (last 3 results) No results for input(s): "PROBNP" in the last 8760 hours.   D-Dimer No results for input(s): "DDIMER" in the last 72 hours. Hemoglobin A1C No results for input(s): "HGBA1C" in the last 72 hours. Fasting Lipid Panel No results for input(s): "CHOL", "HDL", "LDLCALC", "TRIG", "CHOLHDL", "LDLDIRECT" in the last 72 hours. Thyroid Function Tests No results for input(s): "TSH", "T4TOTAL", "T3FREE", "THYROIDAB" in the last 72 hours.  Invalid input(s): "FREET3"  Other results:   Imaging    No results found.   Medications:     Scheduled Medications:  allopurinol  300 mg Oral Daily   aspirin EC  81 mg Oral Daily   busPIRone  5 mg Oral TID   carvedilol  12.5 mg Oral BID   cephALEXin  250 mg Oral QHS   darbepoetin (ARANESP) injection - NON-DIALYSIS  150 mcg Subcutaneous Q Mon-1800   ezetimibe  10 mg Oral Daily   furosemide  80 mg Intravenous BID   insulin aspart  0-15 Units Subcutaneous TID WC   insulin aspart  0-5 Units Subcutaneous QHS   insulin detemir  30 Units Subcutaneous Daily   isosorbide mononitrate  60 mg Oral Daily   linaclotide  145 mcg Oral QAC breakfast   pantoprazole  40 mg Oral Daily   polyethylene glycol  17 g Oral Daily   sodium chloride flush  3 mL Intravenous Q12H   ticagrelor  90 mg Oral BID    Infusions:  sodium chloride Stopped (12/09/22 2245)   ferric gluconate (FERRLECIT)  IVPB Stopped (12/14/22 0851)   promethazine (PHENERGAN) injection (IM or IVPB) Stopped (12/10/22 1448)    PRN Medications: sodium chloride, acetaminophen, bisacodyl, diphenhydrAMINE, HYDROcodone-acetaminophen, lip balm, LORazepam, menthol-cetylpyridinium, nitroGLYCERIN, oxymetazoline, promethazine (PHENERGAN) injection (IM or IVPB), sodium chloride flush    Patient Profile   62 YO w/ CAD (NSTEMI in 3/22), HFpEF, T2DM (hx of DKA), hx of PEA arrest, CKDIII-IV, severe MR s/p TEER (11/22), s/p cardiomems, PAD s/p L great toe and 4th toe amputation admitted on 12/08/22 w/ unstable angina associated w/ lateral ST-T changes on EKG. Lab work notable for sCr of 2.7 on admit. Now s/p balloon angioplasty to LAD stent on 12/08/22. Hospital course c/b AKI on CKD.  Assessment/Plan   1. Chest Pain-->CAD-->NSTEM  H/P multiple PCI with stents to mid LAD and pRCA.  - NSTEMI (2/22). Cath deferred due to AKI. Treated medically.  - NSTEMI (7/22)-->S/p PCI/DES to mid Cx, balloon angioplasty to OM1  - LHC -->LAD DES, severe 85% ostial prox RCA will need eventual intervention but renal function precludes.  - On aspirin, coreg, brilinta - Allergy statins-->muscle pain.  On zetia and PSK9i.    2. AKI Post cath with worsening renal function. Given 60 cc contrast.  Admit Creatinine 2.7-->Scr peaked to 4.25. Had similar events after cath in 2022 with SCr peaking at 5.  Nephrology following. Making urine, SCr trending down, 2.82 today. Holding off on HD for now.  Avoid hypotension.    3. HFpEF , ICM  Has cardiomems.   Echo this admit EF 50-55%  RV mildy reduced.  - Looking back she had similar event in 2022 with gradual improvement.  - 5/28 she had bendopena/orthopnea, now resolved. - Volume status continues to improve. - Continue 80mg  IV lasix BID, still needs more fluid removal. Renal function improving.  - Continue coreg 12.5 mg BID.  - Currently getting treated for UTI will need to avoid SGLT2i.    4. MR   2022 Had TEER- 1XT W and 1NT W clip on 05/18/2021. NT W clip was entangled and had to be deployed into lateral part of valve. No additional clip placement could be preformed. Echo this admit-->Post mitral clip x 2 with XTW and NTW the latter entangled in chords no obvious SLD noted mean gradient 5 mmHg. Mild residual MR appears medial to the two clips    5. UTI  -On Keflex Hold off on SGLT2i   6. OSA Continue nightly CPAP   7. DMII On SSI   8. Anemia, chronic  Iron stats 7%. S/p IV iron 5/29 and again 5/31  7. Constipation   -  no BM x 7 days - advance bowel regimen   Length of Stay: [address] PA-C  12/14/2022, 9:53 AM  Advanced Heart Failure Team Pager [phone] (M-F; 7a - 5p)  Please contact [facility] for night-coverage after hours (5p -7a ) and weekends on [email or web address]

## 2022-12-14 NOTE — TOC Initial Note (Addendum)
Transition of Care [facility]) - Initial/Assessment Note    Patient Details  Name: Dana [name] MRN: [id number] Date of Birth: Aug 14, 1960  [facility]) CM/SW Contact:    Elliot Cousin, RN Phone Number: [phone] 12/14/2022, 11:27 AM  Clinical Narrative:  HF TOC CM spoke to pt and dtr, Felicia at bedside. Pt states husband assist her with transportation to appts. He will start working son and dtr will be able to assist. Pt had [employer] in the past for [facility]. She has needed DME including CPAP, oxygen, and scale. Will continue to follow for dc needs.                Medicare.gov list given to pt and placed on chart.  Expected Discharge Plan: Home w Home Health Services Barriers to Discharge: Continued Medical Work up   Patient Goals and CMS Choice Patient states their goals for this hospitalization and ongoing recovery are:: wants to remain independent CMS Medicare.gov Compare Post Acute Care list provided to:: Patient Choice offered to / list presented to : Patient      Expected Discharge Plan and Services   Discharge Planning Services: CM Consult Post Acute Care Choice: Home Health Living arrangements for the past 2 months: Single Family Home                 iving Arrangements/Services Living arrangements for the past 2 months: Single Family Home Lives with:: Spouse Patient language and need for interpreter reviewed:: Yes        Need for Family Participation in Patient Care: No (Comment) Care giver support system in place?: Yes (comment) Current home services: DME (rolling walker, cane, CPAP, oxygen ([employer]), wheelchair), Cardiomems, Glucometer, Blood pressure cuff Criminal Activity/Legal Involvement Pertinent to Current Situation/Hospitalization: No - Comment as needed  Activities of Daily Living Home Assistive Devices/Equipment: Cane (specify quad or straight) ADL Screening (condition at time of admission) Patient's cognitive ability adequate  to safely complete daily activities?: Yes Is the patient deaf or have difficulty hearing?: No Does the patient have difficulty seeing, even when wearing glasses/contacts?: No Does the patient have difficulty concentrating, remembering, or making decisions?: No Patient able to express need for assistance with ADLs?: Yes Does the patient have difficulty dressing or bathing?: No Independently performs ADLs?: Yes (appropriate for developmental age) Does the patient have difficulty walking or climbing stairs?: No Weakness of Legs: None Weakness of Arms/Hands: None  Permission Sought/Granted Permission sought to share information with : Case Manager, Family Supports, PCP Permission granted to share information with : Yes, Verbal Permission Granted  Share Information with NAME: Brender Hewgley  Permission granted to share info w AGENCY: Home Health  Permission granted to share info w Relationship: husband  Permission granted to share info w Contact Information: [phone]  Emotional Assessment Appearance:: Appears stated age Attitude/Demeanor/Rapport: Engaged Affect (typically observed): Accepting Orientation: : Oriented to Self, Oriented to Place, Oriented to  Time, Oriented to Situation   Psych Involvement: No (comment)  Admission diagnosis:  Unstable angina (HCC) [I20.0] Chest pain, unspecified type [R07.9] Chest pain [R07.9] Patient Active Problem List   Diagnosis Date Noted   Chest pain 12/07/2022   Chronic kidney disease, stage 4 (severe) (HCC) 09/30/2022   Dark urine 09/22/2022   Unstable angina (HCC) 09/07/2022   Chronic idiopathic constipation 09/01/2022   Hyperphosphatemia 01/09/2022   Genetic testing 11/07/2021   Family history of breast cancer 10/23/2021   GAD (generalized anxiety disorder) 07/11/2021   Mixed hyperlipidemia  07/11/2021   S/P mitral valve clip implantation 05/18/2021   History of cardiac arrest 02/06/2021   Impaired mobility and ADLs 01/20/2021    Left-sided weakness 01/20/2021   Drug-induced myopathy 01/03/2021   Secondary hyperparathyroidism of renal origin (HCC) 11/18/2020   Vitamin D insufficiency 11/18/2020   Non-STEMI (non-ST elevated myocardial infarction) (HCC) 09/03/2020   Contrast dye induced nephropathy, possible 09/03/2020   OSA on CPAP 09/03/2020   Need for COVID-19 vaccine 06/28/2020   Medicare annual wellness visit, subsequent 06/13/2020   Chronic respiratory failure with hypoxia (HCC) 03/23/2020   Chronic gout without tophus 03/23/2020   Pain in both hands 03/23/2020   Uncomplicated opioid dependence (HCC) 03/23/2020   Depression, major, recurrent, mild (HCC) 12/21/2019   Hypomagnesemia 12/21/2019   Lumbar back pain 10/05/2019   Diabetic nephropathy associated with type 2 diabetes mellitus (HCC) 10/05/2019   Hypertensive heart and renal disease with congestive heart failure (HCC) 09/18/2019   Demand ischemia    Diastolic heart failure (HCC) 11/07/2017   Class 2 severe obesity due to excess calories with serious comorbidity and body mass index (BMI) of 39.0 to 39.9 in adult [facility]) 11/07/2017   Coronary artery disease of native artery of native heart with stable angina pectoris (HCC) 12/25/2016   Hyperlipidemia LDL goal <70 12/14/2016   Anemia in stage 4 chronic kidney disease (HCC) 03/23/2016   CAD (coronary artery disease) 03/23/2016   Chronic pain syndrome 03/23/2016   GERD without esophagitis 03/23/2016   On home oxygen therapy 03/23/2016   S/P ablation of atrial fibrillation 10/04/2015   Acute renal failure superimposed on stage 3b chronic kidney disease (HCC) 08/30/2015   Diabetic polyneuropathy associated with type 2 diabetes mellitus (HCC) 04/02/2014   PCP:  Blane Ohara, MD Pharmacy:   [facility] - [place], [place] - [address] [place] [address] Phone: [phone] Fax: [phone]  [facility] - [place], [place] - [address] [place] [address] Phone: [phone] Fax: [phone]     Social Determinants of Health (SDOH) Social History: SDOH Screenings   Food Insecurity: No Food Insecurity (12/06/2022)  Housing: Low Risk  (12/06/2022)  Transportation Needs: No Transportation Needs (12/06/2022)  Utilities: Not At Risk (12/06/2022)  Alcohol Screen: Low Risk  (07/25/2022)  Depression (PHQ2-9): Medium Risk (10/30/2022)  Financial Resource Strain: Medium Risk (10/30/2022)  Physical Activity: Insufficiently Active (10/30/2022)  Social Connections: Moderately Integrated (07/25/2022)  Stress: Stress Concern Present (10/30/2022)  Tobacco Use: Medium Risk (12/13/2022)   SDOH Interventions:     Readmission Risk Interventions    05/19/2021    1:11 PM 10/06/2020    2:45 PM 10/04/2020    1:50 PM  Readmission Risk Prevention Plan  Transportation Screening Complete Complete Complete  PCP or Specialist Appt within 3-5 Days  Complete   Social Work Consult for Recovery Care Planning/Counseling  Complete   Palliative Care Screening  Not Applicable Not Applicable  Medication Review Oceanographer) Complete Complete Complete  PCP or Specialist appointment within 3-5 days of discharge Complete    HRI or Home Care Consult Complete    SW Recovery Care/Counseling Consult Complete    Palliative Care Screening Not Applicable    Skilled Nursing Facility Not Applicable

## 2022-12-14 NOTE — Plan of Care (Signed)
  Problem: Health Behavior/Discharge Planning: Goal: Ability to safely manage health-related needs after discharge will improve Outcome: Progressing   Problem: Clinical Measurements: Goal: Cardiovascular complication will be avoided Outcome: Progressing   Problem: Activity: Goal: Risk for activity intolerance will decrease Outcome: Progressing   Problem: Pain Managment: Goal: General experience of comfort will improve Outcome: Progressing   Problem: Safety: Goal: Ability to remain free from injury will improve Outcome: Progressing   Problem: Cardiovascular: Goal: Ability to achieve and maintain adequate cardiovascular perfusion will improve Outcome: Progressing Goal: Vascular access site(s) Level 0-1 will be maintained Outcome: Progressing   Problem: Health Behavior/Discharge Planning: Goal: Ability to safely manage health-related needs after discharge will improve Outcome: Progressing

## 2022-12-15 DIAGNOSIS — I5033 Acute on chronic diastolic (congestive) heart failure: Secondary | ICD-10-CM

## 2022-12-15 DIAGNOSIS — I200 Unstable angina: Secondary | ICD-10-CM | POA: Diagnosis not present

## 2022-12-15 DIAGNOSIS — N1832 Chronic kidney disease, stage 3b: Secondary | ICD-10-CM | POA: Diagnosis not present

## 2022-12-15 DIAGNOSIS — N170 Acute kidney failure with tubular necrosis: Secondary | ICD-10-CM | POA: Diagnosis not present

## 2022-12-15 LAB — RENAL FUNCTION PANEL
Albumin: 3 g/dL — ABNORMAL LOW (ref 3.5–5.0)
Anion gap: 13 (ref 5–15)
BUN: 75 mg/dL — ABNORMAL HIGH (ref 6–20)
CO2: 24 mmol/L (ref 22–32)
Calcium: 8.9 mg/dL (ref 8.9–10.3)
Chloride: 100 mmol/L (ref 98–111)
Creatinine, Ser: 2.68 mg/dL — ABNORMAL HIGH (ref 0.44–1.00)
GFR, Estimated: 20 mL/min — ABNORMAL LOW (ref >60)
Glucose, Bld: 102 mg/dL — ABNORMAL HIGH (ref 70–99)
Phosphorus: 4.2 mg/dL (ref 2.5–4.6)
Potassium: 3.9 mmol/L (ref 3.5–5.1)
Sodium: 137 mmol/L (ref 135–145)

## 2022-12-15 LAB — CBC
HCT: 28.3 % — ABNORMAL LOW (ref 36.0–46.0)
Hemoglobin: 8.7 g/dL — ABNORMAL LOW (ref 12.0–15.0)
MCH: 26.3 pg (ref 26.0–34.0)
MCHC: 30.7 g/dL (ref 30.0–36.0)
MCV: 85.5 fL (ref 80.0–100.0)
Platelets: 311 10*3/uL (ref 150–400)
RBC: 3.31 MIL/uL — ABNORMAL LOW (ref 3.87–5.11)
RDW: 15.2 % (ref 11.5–15.5)
WBC: 12.7 10*3/uL — ABNORMAL HIGH (ref 4.0–10.5)
nRBC: 0 % (ref 0.0–0.2)

## 2022-12-15 LAB — GLUCOSE, CAPILLARY
Glucose-Capillary: 103 mg/dL — ABNORMAL HIGH (ref 70–99)
Glucose-Capillary: 170 mg/dL — ABNORMAL HIGH (ref 70–99)
Glucose-Capillary: 203 mg/dL — ABNORMAL HIGH (ref 70–99)
Glucose-Capillary: 173 mg/dL — ABNORMAL HIGH (ref 70–99)

## 2022-12-15 LAB — MAGNESIUM: Magnesium: 2 mg/dL (ref 1.7–2.4)

## 2022-12-15 MED ORDER — TORSEMIDE 20 MG PO TABS
80.0000 mg | ORAL_TABLET | Freq: Every day | ORAL | Status: DC
  Administered 2022-12-16: 80 mg via ORAL
  Filled 2022-12-15: qty 4

## 2022-12-15 NOTE — Progress Notes (Signed)
CARDIAC REHAB PHASE I   PRE:  Rate/Rhythm: 73 SR    BP: sitting 150/58    SpO2: 98 RA  MODE:  Ambulation: 180 ft   POST:  Rate/Rhythm: 83 SR    BP: sitting 133/51     SpO2: 92 RA   Pt ambulated with standby assist and RW. Slow and steady. Rests periodically, brief. To BR to urinate after walk. Tolerated well in general. Baseline limitations to her back. She has also been ambulating hall with her daughter.  [id number]  Dana [name] BS, ACSM-CEP 12/15/2022 11:50 AM

## 2022-12-15 NOTE — Progress Notes (Signed)
[facility] Progress Note Patient: Dana [name] ZOX:[id number] DOB: 06-May-1961 DOA: 12/06/2022  DOS: the patient was seen and examined on 12/15/2022  Brief hospital course: 63 year old PMH of CAD SP PCI, PAF SP ablation, MR S/P MitraClip, OSA on CPAP, obesity, chronic respiratory failure, CKD stage IIIb with baseline creatinine about 1.9, anemia, HTN, HLD, gout, GERD, anxiety, depression, chronic HFpEF. Presents with complaints of chest pain.  Found to have AKI on CKD 3B.  Also non-STEMI with 85% ostial proximal RCA stenosis Cardiology, nephrology, advanced heart failure services consulted. Assessment and Plan: Non-STEMI CAD requiring PCI In-stent restenosis of mid LAD Patient with extensive history of coronary artery disease, unstable angina: Patient had worsening chest pain after admission. 5/24 loaded with Brilinta and on nitro drip.  ST changes noted. Taken to cardiac cath emergently.  Found to have ISR in the proximal and mid LAD treated with balloon angioplasty. Also roughly 85% ostial proximal RCA stenosis which will require staged intervention once renal function is better. Known unchanged stent occlusion of LCx after OM1. Currently on aspirin, Brilinta. Imdur 60 mg daily. Cardiology following.   Acute on chronic respiratory failure with hypoxemia, respiratory distress: Secondary to acute on chronic diastolic CHF. Patient on oxygen and CPAP at night at home. Using BiPAP in the hospital. Given oxygen to keep saturation more than 90%.  Followed by heart failure team.   AKI with CKD 3B. Baseline creatinine 1.9. Multifactorial AKI including recent use of Bactrim, contrast used for catheterization Nephrology following, conservative therapy with close observation for now. Briefly considered HD although patient now making significant amount of urine with improvement in serum creatinine therefore currently HD held.   UTI: Recently diagnosed at urology office.  Cultures not  available.  He started on Bactrim and long-term Keflex for prophylaxis. Completed Keflex 500 mg 3 times daily for 5 days and now she is back on her chronic suppressive therapy 250 mg daily at bedtime.   Hyperlipidemia, on Zetia and Repatha  Essential hypertension,  Coreg and Imdur was continued.  Holding hydralazine and torsemide.  A-fib, sinus rhythm post ablation. Mitral regurgitation post MitraClip. Type 2 diabetes, well-controlled with long-term insulin use with gastroparesis. Remains on long-acting insulin and sliding scale insulin.  Sleep apnea, using CPAP.  Currently BiPAP for respiratory distress.  Gout,  on home allopurinol.  Dose adjusted for renal function.  GERD,  on PPI.  Anxiety and depression,  on BuSpar and Ativan.  Chronic constipation. Resume Linzess. Continue current bowel regimen. I discussed with the patient that with her CKD Milk of Magnesia or fleets will be contraindicated. X-ray abdomen shows moderate stool.  No blockage.  Had a BM after enema.  Obesity Body mass index is 38.91 kg/m.  Placing the pt at higher risk of poor outcomes.   Subjective: Feeling better.  No nausea no vomiting.  Abdominal pain resolved as well.  No BM so far.  Physical Exam: In mild distress.  No rash. Bowel sound present.  Nontender. Bilateral edema improving. S1-S2 present.  Data Reviewed: I have Reviewed nursing notes, Vitals, and Lab results. Reviewed CBC and BMP.  Reordered CBC and BMP.  Disposition: Status is: Inpatient Remains inpatient appropriate because: Need for IV diuresis  Place TED hose Start: 12/14/22 1608 Place and maintain sequential compression device Start: 12/13/22 0853   Family Communication: Daughter at bedside Level of care: Telemetry Cardiac   Vitals:   12/14/22 1922 12/15/22 0404 12/15/22 0949 12/15/22 1421  BP: (!) 125/44 (!) 132/51 (!) 146/59 Marland Kitchen)  114/56  Pulse: 75 73 74 77  Resp: 16 16  18   Temp: 97.9 F (36.6 C) 97.8 F (36.6 C)   97.8 F (36.6 C)  TempSrc: Oral Oral  Oral  SpO2: 91% 96%  96%  Weight:  102.8 kg    Height:         Author: Lynden Oxford, MD 12/15/2022 6:34 PM  Please look on [email or web address] to find out who is on call.

## 2022-12-15 NOTE — Progress Notes (Signed)
Dana [name]: Pt is a 62 y.o. yo female  with CAD, chronic diastolic CHF, type I DM, HTN, HLD, PAD, OSA on CPAP, PAF status post ablation, PAD, CKD stage IIIb-IV, presented with chest pain/NSTEMI seen as a consultation for AKI on CKD with the need of cardiac cath.  For CKD, she follows with Dr. Elenore Rota at [facility] health for CKD IIIB-IV. Recent baseline creatinine level anywhere between 1.8-2.3 also history of proteinuria in the past however the RAAS blockers was held because of recurrent AKI.   # AKI on CKD IIIb-IV, b/l cr 1.8-2.3: Likely hemodynamically mediated due to combination of diuretics use, Bactrim and NSTEMI.  Urinalysis bland.   -s/p cardiac cath on 5/25 with minimal use of contrast.  -She was oliguric and creatinine level increased-  we had conversations and [name] was to start HD in an effort to control volume more effectively -  bring bun down to help her feel better more quickly  However, then made good urine and numbers not worse-  she felt better already.  So we put plans on hold to start dialysis and continue to attempt to manage her medically.  She understands this is still a tenuous situation.  If she fails to thrive either now or in future dialysis will be reconsidered.  She wishes to change her nephrology care to [facility]-  I can arrange upon discharge.   Still with good UOP and numbers a little better still - clinically much better.  Had changed diuretics to PO but cardiology changed back to IV-  will allow them to manage   # NSTEMI - Cardiac cath on 5/25 with severe multivessel disease, in a standard restenosis status post balloon angioplasty.  She is clinically improved and on cardiac medication and anticoagulation per cardiology team. She needs another intervention at some point but have been told it is not urgent   # Chronic diastolic CHF: had changed diuretics to her OP dosing of torsemide 80 daily In preparation for possible  discharge-  were changed back per heart failure   # Anemia of CKD: She gets erythropoietin injection with her hematologist. Hb stable but low, redosed ESA.  Iron also very low-  has an allergy to iron ?  She said hives but now giving and tolerating with premeds    # Urinary tract infection: It seems like she was started on Bactrim as a prophylaxis because of frequent UTI.  Discontinues Bactrim and switched to Keflex. Last urine without signs of   FYI-  I would be fine to follow the kidney part of this as an OP   Subjective:   1600 of UOP-  BUN/crt a little better still-  she is doing a lot better functionally today -  just constipated-   had a BM.  She tells me cardiology told her one more day of IV diuretics -  weight below her usual EDW in the 220's  Objective Vital signs in last 24 hours: Vitals:   12/14/22 1151 12/14/22 1609 12/14/22 1922 12/15/22 0404  BP: (!) 126/42 (!) 126/50 (!) 125/44 (!) 132/51  Pulse: 71 73 75 73  Resp: 16 16 16 16   Temp: 97.9 F (36.6 C) 97.6 F (36.4 C) 97.9 F (36.6 C) 97.8 F (36.6 C)  TempSrc: Oral Oral Oral Oral  SpO2: 94% 92% 91% 96%  Weight:    102.8 kg  Height:       Weight change: -2.404 kg  Intake/Output Summary (Last  24 hours) at 12/15/2022 0952 Last data filed at 12/15/2022 0300 Gross per 24 hour  Intake 240 ml  Output 1600 ml  Net -1360 ml       Labs: RENAL PANEL Recent Labs  Lab 12/11/22 0112 12/12/22 0154 12/13/22 0330 12/13/22 0331 12/14/22 0117 12/15/22 0640  NA 133* 134*  --  138 135 137  K 3.8 3.8  --  4.1 3.7 3.9  CL 100 101  --  100 100 100  CO2 20* 21*  --  26 23 24   GLUCOSE 109* 174*  --  139* 151* 102*  BUN 79* 85*  --  82* 80* 75*  CREATININE 4.25* 4.26*  --  3.49* 2.82* 2.68*  CALCIUM 8.1* 8.2*  --  8.6* 8.5* 8.9  MG  --   --  2.3  --  2.1 2.0  PHOS 4.7* 5.1*  --  4.8* 3.4 4.2  ALBUMIN 2.6* 2.5*  --  2.8* 2.8* 3.0*    Liver Function Tests: Recent Labs  Lab 12/13/22 0331 12/14/22 0117 12/15/22 0640   ALBUMIN 2.8* 2.8* 3.0*   No results for input(s): "LIPASE", "AMYLASE" in the last 168 hours. No results for input(s): "AMMONIA" in the last 168 hours. CBC: Recent Labs    03/29/22 0955 04/25/22 0813 07/19/22 1529 08/07/22 1103 10/02/22 1043 10/02/22 1044 10/30/22 1053 12/11/22 0112 12/12/22 0154 12/13/22 0330 12/14/22 0117 12/15/22 0640  HGB  --    < >  --    < >  --  9.6*   < > 7.9* 7.7* 8.5* 8.7* 8.7*  MCV  --    < >  --    < >  --  89.0   < > 82.9 83.7 84.7 83.1 85.5  VITAMINB12 596  --  345  --  473  --   --   --   --   --   --   --   FOLATE 9.4  --  9.7  --   --  10.6  --   --   --   --   --   --   FERRITIN 141  --  108  --  93  --   --   --   --   --   --   --   TIBC 263  --  262  --  279  --   --  213*  --   --   --   --   IRON 52  --  35  --  34  --   --  15*  --   --   --   --    < > = values in this interval not displayed.    Cardiac Enzymes: No results for input(s): "CKTOTAL", "CKMB", "CKMBINDEX", "TROPONINI" in the last 168 hours. CBG: Recent Labs  Lab 12/14/22 1150 12/14/22 1607 12/14/22 1658 12/14/22 2011 12/15/22 0755  GLUCAP 212* 144* 186* 176* 103*    Iron Studies:  No results for input(s): "IRON", "TIBC", "TRANSFERRIN", "FERRITIN" in the last 72 hours.  Studies/Results: DG Abd 1 View  Result Date: 12/14/2022 CLINICAL DATA:  Abdominal pain, constipation EXAM: ABDOMEN - 1 VIEW COMPARISON:  01/14/2021 FINDINGS: Mottled gas pattern is nonspecific. There is no significant small bowel dilation. Moderate amount of stool is seen in left colon. Surgical clips are seen in right upper quadrant. Vascular calcifications are seen. IMPRESSION: Nonspecific bowel gas pattern. Moderate amount of stool is seen in left colon. Electronically Signed  By: Ernie Avena M.D.   On: 12/14/2022 17:04    Medications: Infusions:  sodium chloride Stopped (12/09/22 2245)   ferric gluconate (FERRLECIT) IVPB Stopped (12/14/22 1610)    Scheduled Medications:   allopurinol  300 mg Oral Daily   aspirin EC  81 mg Oral Daily   bisacodyl  10 mg Rectal Q1200   busPIRone  5 mg Oral TID   carvedilol  12.5 mg Oral BID   cephALEXin  250 mg Oral QHS   darbepoetin (ARANESP) injection - NON-DIALYSIS  150 mcg Subcutaneous Q Mon-1800   ezetimibe  10 mg Oral Daily   furosemide  80 mg Intravenous BID   insulin aspart  0-15 Units Subcutaneous TID WC   insulin aspart  0-5 Units Subcutaneous QHS   insulin detemir  30 Units Subcutaneous Daily   isosorbide mononitrate  60 mg Oral Daily   linaclotide  145 mcg Oral QAC breakfast   pantoprazole  40 mg Oral Daily   polyethylene glycol  17 g Oral Daily   sodium chloride flush  3 mL Intravenous Q12H   ticagrelor  90 mg Oral BID    have reviewed scheduled and prn medications.  Physical Exam: General:NAD, comfortable Heart:RRR, s1s2 nl Lungs: Bibasal rhonchi, no wheeze Abdomen:soft, Non-tender, non-distended Extremities: generous size of legs at baseline-  cannot appreciate any peripheral edema  Neurology: Alert awake and following commands  Kyon Bentler A Journii Nierman 12/15/2022,9:52 AM  LOS: 8 days

## 2022-12-15 NOTE — Progress Notes (Signed)
Cardiology Rounding Note  PCP-Cardiologist: Arvilla Meres, MD   Subjective:    Net -1.4 L yesterday but also with unmeasured UOP, net -3.9 L on admission  Scr trending down, 4.26>>3.49>>2.82>>2.68.   Denies any chest pain or dyspnea   Objective:   Weight Range: 102.8 kg Body mass index is 38.91 kg/m.   Vital Signs:   Temp:  [97.6 F (36.4 C)-97.9 F (36.6 C)] 97.8 F (36.6 C) (06/01 0404) Pulse Rate:  [71-75] 74 (06/01 0949) Resp:  [16] 16 (06/01 0404) BP: (125-146)/(42-59) 146/59 (06/01 0949) SpO2:  [91 %-96 %] 96 % (06/01 0404) Weight:  [102.8 kg] 102.8 kg (06/01 0404) Last BM Date : 12/14/22  Weight change: Filed Weights   12/13/22 0508 12/14/22 0329 12/15/22 0404  Weight: 104.3 kg 105.2 kg 102.8 kg    Intake/Output:   Intake/Output Summary (Last 24 hours) at 12/15/2022 1136 Last data filed at 12/15/2022 0300 Gross per 24 hour  Intake 240 ml  Output 1600 ml  Net -1360 ml       Physical Exam   General:  Well appearing. No respiratory difficulty HEENT: normal Neck: supple. Thick neck, JVD not well visualized Carotids 2+ bilat; no bruits. No lymphadenopathy or thyromegaly appreciated. Cor: PMI nondisplaced. Regular rate & rhythm. No rubs, gallops or murmurs. Lungs: clear Abdomen: obese, soft, nontender, nondistended. No hepatosplenomegaly. No bruits or masses. Good bowel sounds. Extremities: no cyanosis, clubbing, rash, chronic LE edema Neuro: alert & oriented x 3, cranial nerves grossly intact. moves all 4 extremities w/o difficulty. Affect pleasant.  Telemetry   NSR 70s (Personally reviewed)    EKG   N/A  Labs    CBC Recent Labs    12/14/22 0117 12/15/22 0640  WBC 10.6* 12.7*  HGB 8.7* 8.7*  HCT 27.0* 28.3*  MCV 83.1 85.5  PLT 287 311    Basic Metabolic Panel Recent Labs    40/98/11 0117 12/15/22 0640  NA 135 137  K 3.7 3.9  CL 100 100  CO2 23 24  GLUCOSE 151* 102*  BUN 80* 75*  CREATININE 2.82* 2.68*  CALCIUM 8.5*  8.9  MG 2.1 2.0  PHOS 3.4 4.2    Liver Function Tests Recent Labs    12/14/22 0117 12/15/22 0640  ALBUMIN 2.8* 3.0*    No results for input(s): "LIPASE", "AMYLASE" in the last 72 hours. Cardiac Enzymes No results for input(s): "CKTOTAL", "CKMB", "CKMBINDEX", "TROPONINI" in the last 72 hours.  BNP: BNP (last 3 results) Recent Labs    09/07/22 1013 12/06/22 1814 12/08/22 0159  BNP 168.7* 143.5* 149.2*     ProBNP (last 3 results) No results for input(s): "PROBNP" in the last 8760 hours.   D-Dimer No results for input(s): "DDIMER" in the last 72 hours. Hemoglobin A1C No results for input(s): "HGBA1C" in the last 72 hours. Fasting Lipid Panel No results for input(s): "CHOL", "HDL", "LDLCALC", "TRIG", "CHOLHDL", "LDLDIRECT" in the last 72 hours. Thyroid Function Tests No results for input(s): "TSH", "T4TOTAL", "T3FREE", "THYROIDAB" in the last 72 hours.  Invalid input(s): "FREET3"  Other results:   Imaging    DG Abd 1 View  Result Date: 12/14/2022 CLINICAL DATA:  Abdominal pain, constipation EXAM: ABDOMEN - 1 VIEW COMPARISON:  01/14/2021 FINDINGS: Mottled gas pattern is nonspecific. There is no significant small bowel dilation. Moderate amount of stool is seen in left colon. Surgical clips are seen in right upper quadrant. Vascular calcifications are seen. IMPRESSION: Nonspecific bowel gas pattern. Moderate amount of stool is seen in  left colon. Electronically Signed   By: Ernie Avena M.D.   On: 12/14/2022 17:04     Medications:     Scheduled Medications:  allopurinol  300 mg Oral Daily   aspirin EC  81 mg Oral Daily   bisacodyl  10 mg Rectal Q1200   busPIRone  5 mg Oral TID   carvedilol  12.5 mg Oral BID   cephALEXin  250 mg Oral QHS   darbepoetin (ARANESP) injection - NON-DIALYSIS  150 mcg Subcutaneous Q Mon-1800   ezetimibe  10 mg Oral Daily   furosemide  80 mg Intravenous BID   insulin aspart  0-15 Units Subcutaneous TID WC   insulin aspart   0-5 Units Subcutaneous QHS   insulin detemir  30 Units Subcutaneous Daily   isosorbide mononitrate  60 mg Oral Daily   linaclotide  145 mcg Oral QAC breakfast   pantoprazole  40 mg Oral Daily   polyethylene glycol  17 g Oral Daily   sodium chloride flush  3 mL Intravenous Q12H   ticagrelor  90 mg Oral BID    Infusions:  sodium chloride Stopped (12/09/22 2245)   ferric gluconate (FERRLECIT) IVPB 250 mg (12/15/22 1034)    PRN Medications: sodium chloride, acetaminophen, bisacodyl, diphenhydrAMINE, HYDROcodone-acetaminophen, lip balm, LORazepam, menthol-cetylpyridinium, nitroGLYCERIN, ondansetron (ZOFRAN) IV, oxymetazoline, prochlorperazine, sodium chloride flush    Patient Profile   62 YO w/ CAD (NSTEMI in 3/22), HFpEF, T2DM (hx of DKA), hx of PEA arrest, CKDIII-IV, severe MR s/p TEER (11/22), s/p cardiomems, PAD s/p L great toe and 4th toe amputation admitted on 12/08/22 w/ unstable angina associated w/ lateral ST-T changes on EKG. Lab work notable for sCr of 2.7 on admit. Now s/p balloon angioplasty to LAD stent on 12/08/22. Hospital course c/b AKI on CKD.  Assessment/Plan   1. Chest Pain-->CAD-->NSTEM  H/P multiple PCI with stents to mid LAD and pRCA.  - NSTEMI (2/22). Cath deferred due to AKI. Treated medically.  - NSTEMI (7/22)-->S/p PCI/DES to mid Cx, balloon angioplasty to OM1  - LHC -->LAD DES, severe 85% ostial prox RCA will need eventual intervention but renal function precludes.  - On aspirin, coreg, brilinta - Allergy statins-->muscle pain.  On zetia and PSK9i.    2. AKI Post cath with worsening renal function. Given 60 cc contrast.  Admit Creatinine 2.7-->Scr peaked to 4.25. Had similar events after cath in 2022 with SCr peaking at 5.  Nephrology following. Making urine, SCr trending down, 2.68 today.  Avoid hypotension.    3. HFpEF , ICM  Has cardiomems.   Echo this admit EF 50-55%  RV mildy reduced.  - Looking back she had similar event in 2022 with gradual  improvement.  - 5/28 she had bendopena/orthopnea, now resolved. - Volume status continues to improve. - Continue coreg 12.5 mg BID.  - Currently getting treated for UTI will need to avoid SGLT2i.  - Appears euvolemic, received IV lasix this morning, will transition to PO torsemide tomorrow   4. MR  2022 Had TEER- 1XT W and 1NT W clip on 05/18/2021. NT W clip was entangled and had to be deployed into lateral part of valve. No additional clip placement could be preformed. Echo this admit-->Post mitral clip x 2 with XTW and NTW the latter entangled in chords no obvious SLD noted mean gradient 5 mmHg. Mild residual MR appears medial to the two clips    5. UTI  -On Keflex Hold off on SGLT2i   6. OSA Continue nightly CPAP  7. DMII On SSI   8. Anemia, chronic  Iron stats 7%. S/p IV iron 5/29 and again 5/31  7. Constipation   - no BM x 7 days - advance bowel regimen   Length of Stay: 8  Little Ishikawa, MD  12/15/2022, 11:36 AM

## 2022-12-15 NOTE — Plan of Care (Signed)
  Problem: Health Behavior/Discharge Planning: Goal: Ability to manage health-related needs will improve Outcome: Progressing   Problem: Clinical Measurements: Goal: Cardiovascular complication will be avoided Outcome: Progressing   Problem: Pain Managment: Goal: General experience of comfort will improve Outcome: Progressing   Problem: Safety: Goal: Ability to remain free from injury will improve Outcome: Progressing   Problem: Skin Integrity: Goal: Risk for impaired skin integrity will decrease Outcome: Progressing   

## 2022-12-16 DIAGNOSIS — N170 Acute kidney failure with tubular necrosis: Secondary | ICD-10-CM | POA: Diagnosis not present

## 2022-12-16 DIAGNOSIS — I5033 Acute on chronic diastolic (congestive) heart failure: Secondary | ICD-10-CM | POA: Diagnosis not present

## 2022-12-16 DIAGNOSIS — I200 Unstable angina: Secondary | ICD-10-CM | POA: Diagnosis not present

## 2022-12-16 DIAGNOSIS — N1832 Chronic kidney disease, stage 3b: Secondary | ICD-10-CM | POA: Diagnosis not present

## 2022-12-16 LAB — CBC
HCT: 28.3 % — ABNORMAL LOW (ref 36.0–46.0)
Hemoglobin: 8.8 g/dL — ABNORMAL LOW (ref 12.0–15.0)
MCH: 26.3 pg (ref 26.0–34.0)
MCHC: 31.1 g/dL (ref 30.0–36.0)
MCV: 84.7 fL (ref 80.0–100.0)
Platelets: 337 10*3/uL (ref 150–400)
RBC: 3.34 MIL/uL — ABNORMAL LOW (ref 3.87–5.11)
RDW: 15.2 % (ref 11.5–15.5)
WBC: 15.8 10*3/uL — ABNORMAL HIGH (ref 4.0–10.5)
nRBC: 0 % (ref 0.0–0.2)

## 2022-12-16 LAB — RENAL FUNCTION PANEL
Albumin: 3 g/dL — ABNORMAL LOW (ref 3.5–5.0)
Anion gap: 11 (ref 5–15)
BUN: 76 mg/dL — ABNORMAL HIGH (ref 6–20)
CO2: 27 mmol/L (ref 22–32)
Calcium: 8.8 mg/dL — ABNORMAL LOW (ref 8.9–10.3)
Chloride: 98 mmol/L (ref 98–111)
Creatinine, Ser: 2.76 mg/dL — ABNORMAL HIGH (ref 0.44–1.00)
GFR, Estimated: 19 mL/min — ABNORMAL LOW (ref >60)
Glucose, Bld: 110 mg/dL — ABNORMAL HIGH (ref 70–99)
Phosphorus: 4.1 mg/dL (ref 2.5–4.6)
Potassium: 3.7 mmol/L (ref 3.5–5.1)
Sodium: 136 mmol/L (ref 135–145)

## 2022-12-16 LAB — GLUCOSE, CAPILLARY
Glucose-Capillary: 133 mg/dL — ABNORMAL HIGH (ref 70–99)
Glucose-Capillary: 241 mg/dL — ABNORMAL HIGH (ref 70–99)

## 2022-12-16 LAB — MAGNESIUM: Magnesium: 2.2 mg/dL (ref 1.7–2.4)

## 2022-12-16 MED ORDER — LEVEMIR FLEXPEN 100 UNIT/ML ~~LOC~~ SOPN: 30.0000 [IU] | PEN_INJECTOR | Freq: Every day | SUBCUTANEOUS | Status: DC

## 2022-12-16 MED ORDER — POTASSIUM CHLORIDE CRYS ER 20 MEQ PO TBCR: 20.0000 meq | EXTENDED_RELEASE_TABLET | ORAL | 1 refills | Status: DC

## 2022-12-16 MED ORDER — MAGNESIUM OXIDE 400 MG PO TABS: 400.0000 mg | ORAL_TABLET | Freq: Every day | ORAL | 1 refills | Status: DC

## 2022-12-16 MED ORDER — POLYETHYLENE GLYCOL 3350 17 G PO PACK: 17.0000 g | PACK | Freq: Every day | ORAL | 0 refills | Status: DC

## 2022-12-16 MED ORDER — ISOSORBIDE MONONITRATE ER 60 MG PO TB24: 60.0000 mg | ORAL_TABLET | Freq: Every day | ORAL | 0 refills | Status: DC

## 2022-12-16 MED ORDER — TICAGRELOR 90 MG PO TABS: 90.0000 mg | ORAL_TABLET | Freq: Two times a day (BID) | ORAL | 0 refills | Status: DC

## 2022-12-16 NOTE — Progress Notes (Signed)
[facility] NEPHROLOGY PROGRESS NOTE  Assessment/ Plan: Pt is a 62 y.o. yo female  with CAD, chronic diastolic CHF, type I DM, HTN, HLD, PAD, OSA on CPAP, PAF status post ablation, PAD, CKD stage IIIb-IV, presented with chest pain/NSTEMI seen as a consultation for AKI on CKD with the need of cardiac cath.  For CKD, she follows with Dr. Elenore Rota at [facility] health for CKD IIIB-IV. Recent baseline creatinine level anywhere between 1.8-2.3 also history of proteinuria in the past however the RAAS blockers was held because of recurrent AKI.   # AKI on CKD IIIb-IV, b/l cr 1.8-2.3: Likely hemodynamically mediated due to combination of diuretics use, Bactrim and NSTEMI.  Urinalysis bland.   -s/p cardiac cath on 5/25 with minimal use of contrast.  -She was oliguric and creatinine level increased-  we had conversations and plan was to start HD in an effort to control volume more effectively -  bring bun down to help her feel better more quickly  However, then made good urine and numbers not worse-  she felt better.  So we put plans on hold to start dialysis and continue to attempt to manage her medically.  She understands this is still a tenuous situation.  If she fails to thrive either now or in future dialysis will be reconsidered.  She wishes to change her nephrology care to [facility]-  I can arrange upon discharge.   Still with good UOP and numbers maybe stalled-  could be new baseline.  Had changed diuretics to PO but cardiology changed back to IV-  will allow them to manage-  she is back on torsemide 80 daily today   # NSTEMI - Cardiac cath on 5/25 with severe multivessel disease, in a standard restenosis status post balloon angioplasty.  She is clinically improved and on cardiac medication and anticoagulation per cardiology team. She needs another intervention at some point but have been told it is not urgent   # Chronic diastolic CHF: had changed diuretics to her OP dosing of torsemide 80 daily In  preparation for possible discharge-  were changed back per heart failure   # Anemia of CKD: She gets erythropoietin injection with her hematologist. Hb stable but low, redosed ESA.  Iron also very low-  has an allergy to iron ?  She said hives but now giving and tolerating with premeds    # Urinary tract infection: Discontinues Bactrim and switched to Keflex. Last urine without signs of    I am going to sign off and arrange OP follow up at [facility]-  anticipate she will be discharged tomorrow or the next day -  call with any questions   Subjective:   2100 of UOP-  BUN/crt stable  she is doing a lot better functionally today -    weight below her usual EDW in the 220's  Objective Vital signs in last 24 hours: Vitals:   12/15/22 1421 12/15/22 2003 12/16/22 0313 12/16/22 0608  BP: (!) 114/56 (!) 109/40 (!) 120/43   Pulse: 77 71 71   Resp: 18 18 17    Temp: 97.8 F (36.6 C) 97.6 F (36.4 C) 98 F (36.7 C)   TempSrc: Oral Oral Oral   SpO2: 96% 95% 98%   Weight:    102 kg  Height:       Weight change: -0.816 kg  Intake/Output Summary (Last 24 hours) at 12/16/2022 1031 Last data filed at 12/16/2022 0600 Gross per 24 hour  Intake 480 ml  Output  2100 ml  Net -1620 ml       Labs: RENAL PANEL Recent Labs  Lab 12/12/22 0154 12/13/22 0330 12/13/22 0331 12/14/22 0117 12/15/22 0640 12/16/22 0228  NA 134*  --  138 135 137 136  K 3.8  --  4.1 3.7 3.9 3.7  CL 101  --  100 100 100 98  CO2 21*  --  26 23 24 27   GLUCOSE 174*  --  139* 151* 102* 110*  BUN 85*  --  82* 80* 75* 76*  CREATININE 4.26*  --  3.49* 2.82* 2.68* 2.76*  CALCIUM 8.2*  --  8.6* 8.5* 8.9 8.8*  MG  --  2.3  --  2.1 2.0 2.2  PHOS 5.1*  --  4.8* 3.4 4.2 4.1  ALBUMIN 2.5*  --  2.8* 2.8* 3.0* 3.0*    Liver Function Tests: Recent Labs  Lab 12/14/22 0117 12/15/22 0640 12/16/22 0228  ALBUMIN 2.8* 3.0* 3.0*   No results for input(s): "LIPASE", "AMYLASE" in the last 168 hours. No results for input(s): "AMMONIA" in  the last 168 hours. CBC: Recent Labs    03/29/22 0955 04/25/22 0813 07/19/22 1529 08/07/22 1103 10/02/22 1043 10/02/22 1044 10/30/22 1053 12/11/22 0112 12/12/22 0154 12/13/22 0330 12/14/22 0117 12/15/22 0640 12/16/22 0228  HGB  --    < >  --    < >  --  9.6*   < > 7.9* 7.7* 8.5* 8.7* 8.7* 8.8*  MCV  --    < >  --    < >  --  89.0   < > 82.9 83.7 84.7 83.1 85.5 84.7  VITAMINB12 596  --  345  --  473  --   --   --   --   --   --   --   --   FOLATE 9.4  --  9.7  --   --  10.6  --   --   --   --   --   --   --   FERRITIN 141  --  108  --  93  --   --   --   --   --   --   --   --   TIBC 263  --  262  --  279  --   --  213*  --   --   --   --   --   IRON 52  --  35  --  34  --   --  15*  --   --   --   --   --    < > = values in this interval not displayed.    Cardiac Enzymes: No results for input(s): "CKTOTAL", "CKMB", "CKMBINDEX", "TROPONINI" in the last 168 hours. CBG: Recent Labs  Lab 12/15/22 0755 12/15/22 1143 12/15/22 1719 12/15/22 2054 12/16/22 0747  GLUCAP 103* 170* 203* 173* 133*    Iron Studies:  No results for input(s): "IRON", "TIBC", "TRANSFERRIN", "FERRITIN" in the last 72 hours.  Studies/Results: DG Abd 1 View  Result Date: 12/14/2022 CLINICAL DATA:  Abdominal pain, constipation EXAM: ABDOMEN - 1 VIEW COMPARISON:  01/14/2021 FINDINGS: Mottled gas pattern is nonspecific. There is no significant small bowel dilation. Moderate amount of stool is seen in left colon. Surgical clips are seen in right upper quadrant. Vascular calcifications are seen. IMPRESSION: Nonspecific bowel gas pattern. Moderate amount of stool is seen in left colon. Electronically Signed   By: Rhae Hammock  Rathinasamy M.D.   On: 12/14/2022 17:04    Medications: Infusions:  sodium chloride Stopped (12/09/22 2245)    Scheduled Medications:  allopurinol  300 mg Oral Daily   aspirin EC  81 mg Oral Daily   bisacodyl  10 mg Rectal Q1200   busPIRone  5 mg Oral TID   carvedilol  12.5 mg Oral BID    cephALEXin  250 mg Oral QHS   darbepoetin (ARANESP) injection - NON-DIALYSIS  150 mcg Subcutaneous Q Mon-1800   ezetimibe  10 mg Oral Daily   insulin aspart  0-15 Units Subcutaneous TID WC   insulin aspart  0-5 Units Subcutaneous QHS   insulin detemir  30 Units Subcutaneous Daily   isosorbide mononitrate  60 mg Oral Daily   linaclotide  145 mcg Oral QAC breakfast   pantoprazole  40 mg Oral Daily   polyethylene glycol  17 g Oral Daily   sodium chloride flush  3 mL Intravenous Q12H   ticagrelor  90 mg Oral BID   torsemide  80 mg Oral Daily    have reviewed scheduled and prn medications.  Physical Exam: General:NAD, comfortable Heart:RRR, s1s2 nl Lungs: Bibasal rhonchi, no wheeze Abdomen:soft, Non-tender, non-distended Extremities: generous size of legs at baseline-  cannot appreciate any peripheral edema  Neurology: Alert awake and following commands  Socorro Kanitz A Sayvion Vigen 12/16/2022,10:31 AM  LOS: 9 days

## 2022-12-16 NOTE — Discharge Summary (Signed)
Physician Discharge Summary   Patient: Dana [name] MRN: [id number] DOB: 1961/02/26  Admit date:     12/06/2022  Discharge date: 12/16/22  Discharge Physician: Lynden Oxford  PCP: Blane Ohara, MD  Recommendations at discharge: Follow-up with PCP in 1 week with CBC and BMP. Follow-up with nephrology as well as cardiology as recommended.   Follow-up Information     Cox, Kirsten, MD. Schedule an appointment as soon as possible for a visit in 1 week(s).   Specialty: Family Medicine Why: with CBC and BMP Contact information: [address] [name] [address] [phone]         Annie Sable, MD. Schedule an appointment as soon as possible for a visit in 2 week(s).   Specialty: Nephrology Contact information: [address] [place] [address] [phone]                Discharge Diagnoses: Principal Problem:   Unstable angina (HCC) Active Problems:   Acute renal failure superimposed on stage 3b chronic kidney disease (HCC)   Anemia in stage 4 chronic kidney disease (HCC)   Chronic pain syndrome   Coronary artery disease of native artery of native heart with stable angina pectoris (HCC)   GERD without esophagitis   S/P ablation of atrial fibrillation   Diastolic heart failure (HCC)   Class 2 severe obesity due to excess calories with serious comorbidity and body mass index (BMI) of 39.0 to 39.9 in adult [facility])   Hypertensive heart and renal disease with congestive heart failure (HCC)   Depression, major, recurrent, mild (HCC)   Chronic respiratory failure with hypoxia (HCC)   Chronic gout without tophus   Non-STEMI (non-ST elevated myocardial infarction) (HCC)   OSA on CPAP   History of cardiac arrest   S/P mitral valve clip implantation   GAD (generalized anxiety disorder)   Mixed hyperlipidemia   Chronic kidney disease, stage 4 (severe) (HCC)   Chest pain  Hospital Course: 62 year old PMH of CAD SP PCI, PAF SP ablation, MR S/P  MitraClip, OSA on CPAP, obesity, chronic respiratory failure, CKD stage IIIb with baseline creatinine about 1.9, anemia, HTN, HLD, gout, GERD, anxiety, depression, chronic HFpEF. Presents with complaints of chest pain.  Found to have AKI on CKD 3B.  Also non-STEMI with 85% ostial proximal RCA stenosis Cardiology, nephrology, advanced heart failure services consulted. Renal function mildly elevated.  Patient significantly better.  Eager to go home on 6/2.  Nephrology signed off.  Cardiology was okay with discharge.  Assessment and Plan  Non-STEMI CAD requiring PCI In-stent restenosis of mid LAD Patient with extensive history of coronary artery disease, unstable angina: Patient had worsening chest pain after admission. 5/24 loaded with Brilinta and on nitro drip.  ST changes noted. Taken to cardiac cath emergently.  Found to have ISR in the proximal and mid LAD treated with balloon angioplasty. Also roughly 85% ostial proximal RCA stenosis which will require staged intervention once renal function is better. Known unchanged stent occlusion of LCx after OM1. Currently on aspirin, Brilinta. Imdur 60 mg daily. Cardiology following.  Plan is outpatient follow-up with staged procedure for her CAD.  Imdur dose increased.   Acute on chronic respiratory failure with hypoxemia, respiratory distress: Secondary to acute on chronic diastolic CHF. Patient on oxygen and CPAP at night at home. Using BiPAP in the hospital. Given oxygen to keep saturation more than 90%.  Followed by heart failure team.   AKI with CKD 3B. Baseline creatinine 1.9. Multifactorial  AKI including recent use of Bactrim, contrast used for catheterization Nephrology following, conservative therapy with close observation for now. Briefly considered HD although after that the patient started making good amount of urine with improvement in renal function and responding to IV Lasix. Outpatient follow-up with nephrology recommended.    UTI: Recently diagnosed at urology office.  Cultures not available.  He started on Bactrim and long-term Keflex for prophylaxis. Completed Keflex 500 mg 3 times daily for 5 days and now she is back on her chronic suppressive therapy 250 mg daily at bedtime. Patient has developed some leukocytosis over the last 72 hours in the hospital which suspect is secondary to hemoconcentration rather than acute infection as the patient does not have any signs or symptoms of acute infection.   Hyperlipidemia, on Zetia and Repatha   Essential hypertension,  Coreg and Imdur was continued.  Holding hydralazine.   A-fib, sinus rhythm post ablation. Mitral regurgitation post MitraClip. Type 2 diabetes, well-controlled with long-term insulin use with gastroparesis. Remains on long-acting insulin and sliding scale insulin. Dose adjusted based on her requirement in the hospital.   Sleep apnea, using CPAP.  Currently CPAP for respiratory distress.   Gout,  on home allopurinol.  Dose adjusted for renal function.   GERD,  on PPI.   Anxiety and depression,  on BuSpar and Ativan.   Chronic constipation. Resume Linzess.   Obesity Body mass index is 38.6 kg/m.  Placing the pt at higher risk of poor outcomes.   Consultants:  Cardiology Nephrology Advanced heart failure service.  Procedures performed:  Echocardiogram  Left heart catheter  DISCHARGE MEDICATION: Allergies as of 12/16/2022       Reactions   Naproxen Hives, Rash   Shellfish-derived Products Hives, Swelling, Other (See Comments)   Angioedema   Strawberry (diagnostic) Hives   Celecoxib Other (See Comments)   Stomach pain   Glucosamine Hives, Swelling, Other (See Comments)   Angioedema   Iron    IV iron transfusion - hives - Feb 2022 hospitalization   Metoclopramide Other (See Comments)   Facial twitching and stuttering   Metolazone Other (See Comments)   Acute renal failure   Statins Other (See Comments)   Muscle pain         Medication List     STOP taking these medications    clopidogrel 75 MG tablet Commonly known as: PLAVIX   FUROSCIX    hydrALAZINE 25 MG tablet Commonly known as: APRESOLINE   sulfamethoxazole-trimethoprim 800-160 MG tablet Commonly known as: BACTRIM DS       TAKE these medications    allopurinol 300 MG tablet Commonly known as: ZYLOPRIM Take 1 tablet (300 mg total) by mouth daily.   aspirin EC 81 MG tablet Take 1 tablet (81 mg total) by mouth daily. Swallow whole.   BD Pen Needle Nano 2nd Gen 32G X 4 MM Misc Generic drug: Insulin Pen Needle Inject 1 each into the skin in the morning, at noon, in the evening, and at bedtime.   busPIRone 5 MG tablet Commonly known as: BUSPAR Take 1 tablet (5 mg total) by mouth 3 (three) times daily.   carvedilol 12.5 MG tablet Commonly known as: COREG TAKE ONE TABLET BY MOUTH TWICE DAILY What changed: when to take this   cephALEXin 250 MG capsule Commonly known as: KEFLEX Take 250 mg by mouth at bedtime.   CRANBERRY PO Take 2 tablets by mouth daily.   EPINEPHrine 0.3 mg/0.3 mL Soaj injection Commonly known as: EPI-PEN  Use as directed for life-threatening allergic reaction.   estradiol 0.1 MG/GM vaginal cream Commonly known as: ESTRACE Place 1 Applicatorful vaginally every other day.   ezetimibe 10 MG tablet Commonly known as: ZETIA Take 1 tablet (10 mg total) by mouth daily.   FISH OIL PO Take 1 capsule by mouth daily.   FreeStyle Libre 2 Sensor Misc USE TO check blood glucose AS DIRECTED AND CHANGE sensor every 14 DAYS   HYDROcodone-acetaminophen 7.5-325 MG tablet Commonly known as: NORCO TAKE ONE TABLET BY MOUTH THREE TIMES DAILY AS NEEDED What changed: reasons to take this   isosorbide mononitrate 60 MG 24 hr tablet Commonly known as: IMDUR Take 1 tablet (60 mg total) by mouth daily. Start taking on: December 17, 2022 What changed:  medication strength how much to take   latanoprost 0.005 %  ophthalmic solution Commonly known as: XALATAN Place 1 drop into both eyes at bedtime.   Levemir FlexPen 100 UNIT/ML FlexPen Generic drug: insulin detemir Inject 30 Units into the skin daily. What changed: See the new instructions.   linaclotide 145 MCG Caps capsule Commonly known as: Linzess Take 1 capsule (145 mcg total) by mouth daily before breakfast.   LORazepam 0.5 MG tablet Commonly known as: ATIVAN TAKE ONE TABLET BY MOUTH ONCE DAILY AS NEEDED FOR ANXIETY What changed: See the new instructions.   magnesium oxide 400 MG tablet Commonly known as: MAG-OX Take 1 tablet (400 mg total) by mouth daily. Take 2 (400 mg) daily=800 mg What changed: how much to take   nitroGLYCERIN 0.4 MG SL tablet Commonly known as: NITROSTAT Dissolve 1 tab under tongue as needed for chest pain. May repeat every 5 minutes x 2 doses. If no relief call 9-1-1. Please call [phone] to schedule an appointment for future refills. Thank you.   NovoLOG FlexPen 100 UNIT/ML FlexPen Generic drug: insulin aspart 2-10 U before meals based on SSI.   OXYGEN Inhale 2 L into the lungs at bedtime.   pantoprazole 40 MG tablet Commonly known as: PROTONIX Take 1 tablet (40 mg total) by mouth daily.   polyethylene glycol 17 g packet Commonly known as: MIRALAX / GLYCOLAX Take 17 g by mouth daily. Start taking on: December 17, 2022   potassium chloride SA 20 MEQ tablet Commonly known as: KLOR-CON M Take 1 tablet (20 mEq total) by mouth 2 (two) times a week. Start taking on: December 17, 2022 What changed: when to take this   Probiotic Caps Take 1 capsule by mouth daily.   promethazine 25 MG tablet Commonly known as: PHENERGAN Take 1 tablet (25 mg total) by mouth daily as needed. What changed: reasons to take this   Repatha SureClick 140 MG/ML Soaj Generic drug: Evolocumab Inject 1 Dose into the skin every 14 (fourteen) days.   ticagrelor 90 MG Tabs tablet Commonly known as: BRILINTA Take 1 tablet (90 mg  total) by mouth 2 (two) times daily.   torsemide 20 MG tablet Commonly known as: DEMADEX TAKE 4 TABLETS BY MOUTH ONCE DAILY What changed: See the new instructions.       Disposition: Home Diet recommendation: Cardiac diet  Discharge Exam: Vitals:   12/15/22 2003 12/16/22 0313 12/16/22 0608 12/16/22 1222  BP: (!) 109/40 (!) 120/43  (!) 128/53  Pulse: 71 71  71  Resp: 18 17  18   Temp: 97.6 F (36.4 C) 98 F (36.7 C)  98.5 F (36.9 C)  TempSrc: Oral Oral  Oral  SpO2: 95% 98%  94%  Weight:  102 kg   Height:       General: Appear in mild distress; no visible Abnormal Neck Mass Or lumps, Conjunctiva normal Cardiovascular: S1 and S2 Present, no Murmur, Respiratory: good respiratory effort, Bilateral Air entry present and CTA, no Crackles, no wheezes Abdomen: Bowel Sound present, Non tender  Extremities: bilateral trace Pedal edema Neurology: alert and oriented to time, place, and person  Filed Weights   12/14/22 0329 12/15/22 0404 12/16/22 4034  Weight: 105.2 kg 102.8 kg 102 kg   Condition at discharge: stable  The results of significant diagnostics from this hospitalization (including imaging, microbiology, ancillary and laboratory) are listed below for reference.   Imaging Studies: DG Abd 1 View  Result Date: 12/14/2022 CLINICAL DATA:  Abdominal pain, constipation EXAM: ABDOMEN - 1 VIEW COMPARISON:  01/14/2021 FINDINGS: Mottled gas pattern is nonspecific. There is no significant small bowel dilation. Moderate amount of stool is seen in left colon. Surgical clips are seen in right upper quadrant. Vascular calcifications are seen. IMPRESSION: Nonspecific bowel gas pattern. Moderate amount of stool is seen in left colon. Electronically Signed   By: Ernie Avena M.D.   On: 12/14/2022 17:04   CARDIAC CATHETERIZATION  Addendum Date: 12/13/2022     Prox LAD to Mid LAD lesion is 95% stenosed. - In-stent Re-stenosis   Scoring balloon angioplasty was performed using a BALLN  SCOREFLEX 2.75X15.=> Post intervention, there is a 15% residual stenosis.   Mid LAD to Dist LAD lesion is 60% stenosed.   Mid Cx stent - patent up to 1st Mrg.- but occluded beyond the 1st Mrg.  1st Mrg-1 lesion is 30% stenosed. 1st Mrg-2 lesion is 60% stenosed.   Mid Cx to Dist Cx lesion is 100% stenosed.   Prox RCA lesion is 85% stenosed.   Dist RCA stent - patent.   ----------------------------   LV end diastolic pressure is moderately elevated.   There is no aortic valve stenosis. POST-CATH DIAGNOSES Severe Multivessel Disease: Likely culprit lesion is 90% ISR are in the proximal and mid portion of the previously based LAD DES => successful scoring balloon angioplasty with 2.75 mm x 15 mm ScoreFlex balloon to high atmospheres = > 90% reduced to ~20% Severe ~85% Ostial-Prox RCA (with sever Cahteter Damping during engagement = Will need Staged Intervention once Renal Fxn is stable. Known occlusion of LCx after OM1 (stent occlusion).  OM1 has diffuse extensive disease-no change from previous cath.. Mod-Severely Elevated LVEDP ~23 mmHg. RECOMMENDATIONS Will run gentle IVF at 50 mL in our for 12 hours.  I have given her 1 dose of IV Lasix and may require additional dosing on the floor. She will need staged PCI on the RCA at a later date once renal function stable.  At that time depending on renal function may consider IVUS versus OCT evaluation of LAD stents to determine etiology of ISR.Marland Kitchen  Not performed today due to contrast concerns. Bryan Lemma, MD   Result Date: 12/13/2022   Prox LAD to Mid LAD lesion is 95% stenosed. - In-stent Re-stenosis   Scoring balloon angioplasty was performed using a BALLN SCOREFLEX 2.75X15.=> Post intervention, there is a 15% residual stenosis.   Mid LAD to Dist LAD lesion is 60% stenosed.   Mid Cx stent - patent up to 1st Mrg.- but occluded beyond the 1st Mrg.  1st Mrg-1 lesion is 30% stenosed. 1st Mrg-2 lesion is 60% stenosed.   Mid Cx to Dist Cx lesion is 100% stenosed.   Prox RCA  lesion is 85%  stenosed.   Dist RCA stent - patent.   ----------------------------   LV end diastolic pressure is moderately elevated.   There is no aortic valve stenosis. POST-CATH DIAGNOSES Severe Multivessel Disease: Likely culprit lesion is 90% ISR are in the proximal and mid portion of the previously based LAD DES => successful scoring balloon angioplasty with 2.75 mm x 15 mm ScoreFlex balloon to high atmospheres = > 90% reduced to ~20% Severe ~85% Ostial-Prox RCA (with sever Cahteter Damping during engagement = Will need Staged Intervention once Renal Fxn is stable. Known occlusion of LCx after OM1 (stent occlusion).  OM1 has diffuse extensive disease-no change from previous cath.. Mod-Severely Elevated LVEDP ~23 mmHg. RECOMMENDATIONS Will run gentle IVF at 50 mL in our for 12 hours.  I have given her 1 dose of IV Lasix and may require additional dosing on the floor. She will need staged PCI on the RCA at a later date once renal function stable.  At that time depending on renal function may consider IVUS versus OCT evaluation of LAD stents to determine etiology of ISR.Marland Kitchen  Not performed today due to contrast concerns. Bryan Lemma, MD   ECHOCARDIOGRAM COMPLETE  Result Date: 12/09/2022    ECHOCARDIOGRAM REPORT   Patient Name:   Dana [name] Date of Exam: 12/09/2022 Medical Rec #:  [id number]      Height:       64.0 in Accession #:    [id number]     Weight:       231.5 lb Date of Birth:  01/03/1961      BSA:          2.082 m Patient Age:    61 years       BP:           127/47 mmHg Patient Gender: F              HR:           79 bpm. Exam Location:  Inpatient Procedure: 2D Echo, Cardiac Doppler, Color Doppler and Intracardiac            Opacification Agent Indications:    mitral clip CAD  History:        Patient has prior history of Echocardiogram examinations. CAD;                 Mitral Valve Disease.  Sonographer:    Dondra Prader RVT Referring Phys: [id number] DAVID W HARDING IMPRESSIONS  1. Abnormla septal motion  suggestig RV pressure overload . Left ventricular ejection fraction, by estimation, is 50 to 55%. The left ventricle has low normal function. The left ventricle has no regional wall motion abnormalities. There is mild left ventricular hypertrophy. Left ventricular diastolic parameters are indeterminate.  2. Right ventricular systolic function is mildly reduced. The right ventricular size is mildly enlarged.  3. Post mitral clip x 2 with XTW and NTW the latter entangled in chords no obvious SLD noted mean gradient 5 mmHg in diastole at HR of 80 bpm Mild residual MR appears medial to the two clips . The mitral valve has been repaired/replaced. Mild mitral valve regurgitation. No evidence of mitral stenosis.  4. The aortic valve is tricuspid. There is mild calcification of the aortic valve. Aortic valve regurgitation is not visualized. Aortic valve sclerosis is present, with no evidence of aortic valve stenosis.  5. The inferior vena cava is dilated in size with >50% respiratory variability, suggesting right atrial pressure of 8 mmHg. FINDINGS  Left  Ventricle: Abnormla septal motion suggestig RV pressure overload. Left ventricular ejection fraction, by estimation, is 50 to 55%. The left ventricle has low normal function. The left ventricle has no regional wall motion abnormalities. The left ventricular internal cavity size was normal in size. There is mild left ventricular hypertrophy. Left ventricular diastolic parameters are indeterminate. Right Ventricle: The right ventricular size is mildly enlarged. No increase in right ventricular wall thickness. Right ventricular systolic function is mildly reduced. Left Atrium: Left atrial size was normal in size. Right Atrium: Right atrial size was normal in size. Pericardium: Trivial pericardial effusion is present. The pericardial effusion is posterior to the left ventricle. Mitral Valve: Post mitral clip x 2 with XTW and NTW the latter entangled in chords no obvious SLD  noted mean gradient 5 mmHg in diastole at HR of 80 bpm Mild residual MR appears medial to the two clips. The mitral valve has been repaired/replaced. Mild mitral valve regurgitation. No evidence of mitral valve stenosis. MV peak gradient, 16.6 mmHg. The mean mitral valve gradient is 5.5 mmHg. Tricuspid Valve: The tricuspid valve is normal in structure. Tricuspid valve regurgitation is not demonstrated. No evidence of tricuspid stenosis. Aortic Valve: The aortic valve is tricuspid. There is mild calcification of the aortic valve. Aortic valve regurgitation is not visualized. Aortic valve sclerosis is present, with no evidence of aortic valve stenosis. Aortic valve mean gradient measures 2.5 mmHg. Aortic valve peak gradient measures 4.8 mmHg. Aortic valve area, by VTI measures 2.83 cm. Pulmonic Valve: The pulmonic valve was normal in structure. Pulmonic valve regurgitation is trivial. No evidence of pulmonic stenosis. Aorta: The aortic root is normal in size and structure. Venous: The inferior vena cava is dilated in size with greater than 50% respiratory variability, suggesting right atrial pressure of 8 mmHg. IAS/Shunts: No atrial level shunt detected by color flow Doppler.  LEFT VENTRICLE PLAX 2D LVIDd:         4.40 cm   Diastology LVIDs:         3.40 cm   LV e' medial:    5.15 cm/s LV PW:         1.20 cm   LV E/e' medial:  38.4 LV IVS:        1.20 cm   LV e' lateral:   4.33 cm/s LVOT diam:     1.80 cm   LV E/e' lateral: 45.7 LV SV:         58 LV SV Index:   28 LVOT Area:     2.54 cm  RIGHT VENTRICLE            IVC RV Basal diam:  4.30 cm    IVC diam: 2.00 cm RV Mid diam:    3.80 cm RV S prime:     8.11 cm/s TAPSE (M-mode): 1.8 cm LEFT ATRIUM             Index        RIGHT ATRIUM           Index LA diam:        3.60 cm 1.73 cm/m   RA Area:     18.70 cm LA Vol (A2C):   76.0 ml 36.51 ml/m  RA Volume:   56.50 ml  27.14 ml/m LA Vol (A4C):   84.2 ml 40.44 ml/m LA Biplane Vol: 83.6 ml 40.16 ml/m  AORTIC VALVE  PULMONIC VALVE AV Area (Vmax):    2.47 cm     PV Vmax:       1.16 m/s AV Area (Vmean):   2.45 cm     PV Peak grad:  5.4 mmHg AV Area (VTI):     2.83 cm AV Vmax:           109.30 cm/s AV Vmean:          75.950 cm/s AV VTI:            0.204 m AV Peak Grad:      4.8 mmHg AV Mean Grad:      2.5 mmHg LVOT Vmax:         106.00 cm/s LVOT Vmean:        73.100 cm/s LVOT VTI:          0.226 m LVOT/AV VTI ratio: 1.11  AORTA Ao Root diam: 2.80 cm MITRAL VALVE MV Area (PHT): 2.35 cm     SHUNTS MV Area VTI:   1.13 cm     Systemic VTI:  0.23 m MV Peak grad:  16.6 mmHg    Systemic Diam: 1.80 cm MV Mean grad:  5.5 mmHg MV Vmax:       2.04 m/s MV Vmean:      106.3 cm/s MV Decel Time: 323 msec MV E velocity: 198.00 cm/s MV A velocity: 121.00 cm/s MV E/A ratio:  1.64 Charlton Haws MD Electronically signed by Charlton Haws MD Signature Date/Time: 12/09/2022/3:12:11 PM    Final    DG CHEST PORT 1 VIEW  Result Date: 12/08/2022 CLINICAL DATA:  Dyspnea. EXAM: PORTABLE CHEST 1 VIEW COMPARISON:  12/06/2022 FINDINGS: The cardio pericardial silhouette is enlarged. Diffuse interstitial and basilar airspace disease is mildly progressive in the interval. Mitral clips and CardioMEMS pulmonary arterial device again noted Bones are diffusely demineralized. Telemetry leads overlie the chest. IMPRESSION: Interval progression of diffuse interstitial and basilar airspace disease, likely edema. Electronically Signed   By: Kennith Center M.D.   On: 12/08/2022 07:25   DG Chest Portable 1 View  Result Date: 12/06/2022 CLINICAL DATA:  Left chest pain radiating to the left arm EXAM: PORTABLE CHEST 1 VIEW COMPARISON:  09/07/2022 FINDINGS: Mitral clips are obscured by overlying ECG lead. Coronary atherosclerosis noted. A small device projecting over the left lower lobe pulmonary artery is compatible with a CardioMEMS type pulmonary arterial pressure monitor. The patient is rotated to the right on today's radiograph, reducing diagnostic  sensitivity and specificity. Moderate cardiomegaly. Mild cephalization of blood flow, nonspecific on semi erect positioning, but pulmonary venous hypertension is not excluded. No overt edema. No blunting of the lateral costophrenic angles. Chronic deformity of the left proximal humerus likely from healed fracture. IMPRESSION: 1. Moderate cardiomegaly with mild cephalization of blood flow, nonspecific on semi erect positioning, but pulmonary venous hypertension is not excluded. No overt edema. 2. Mitraclips are obscured by overlying ECG lead. Pulmonary arterial pressure monitor noted. 3. Coronary atherosclerosis. 4. Chronic deformity of the left proximal humerus likely from healed fracture. Electronically Signed   By: Gaylyn Rong M.D.   On: 12/06/2022 11:37    Microbiology: Results for orders placed or performed in visit on 09/27/22  Urine Culture     Status: Abnormal   Collection Time: 09/27/22 11:42 AM   Specimen: Urine   UR  Result Value Ref Range Status   Urine Culture, Routine Final report (A)  Final   Organism ID, Bacteria Klebsiella pneumoniae (A)  Final  Comment: Greater than 100,000 colony forming units per mL Cefazolin <=4 ug/mL Cefazolin with an MIC <=16 predicts susceptibility to the oral agents cefaclor, cefdinir, cefpodoxime, cefprozil, cefuroxime, cephalexin, and loracarbef when used for therapy of uncomplicated urinary tract infections due to E. coli, Klebsiella pneumoniae, and Proteus mirabilis.    Antimicrobial Susceptibility Comment  Final    Comment:       ** S = Susceptible; I = Intermediate; R = Resistant **                    P = Positive; N = Negative             MICS are expressed in micrograms per mL    Antibiotic                 RSLT#1    RSLT#2    RSLT#3    RSLT#4 Amoxicillin/Clavulanic Acid    S Ampicillin                     R Cefepime                       S Ceftriaxone                    S Cefuroxime                     S Ciprofloxacin                   S Ertapenem                      S Gentamicin                     S Imipenem                       S Levofloxacin                   S Meropenem                      S Nitrofurantoin                 R Piperacillin/Tazobactam        S Tetracycline                   I Tobramycin                     S Trimethoprim/Sulfa             S    Labs: CBC: Recent Labs  Lab 12/12/22 0154 12/13/22 0330 12/14/22 0117 12/15/22 0640 12/16/22 0228  WBC 11.8* 11.1* 10.6* 12.7* 15.8*  HGB 7.7* 8.5* 8.7* 8.7* 8.8*  HCT 23.7* 27.6* 27.0* 28.3* 28.3*  MCV 83.7 84.7 83.1 85.5 84.7  PLT 214 263 287 311 337   Basic Metabolic Panel: Recent Labs  Lab 12/12/22 0154 12/13/22 0330 12/13/22 0331 12/14/22 0117 12/15/22 0640 12/16/22 0228  NA 134*  --  138 135 137 136  K 3.8  --  4.1 3.7 3.9 3.7  CL 101  --  100 100 100 98  CO2 21*  --  26 23 24 27   GLUCOSE 174*  --  139* 151* 102* 110*  BUN 85*  --  82* 80* 75* 76*  CREATININE 4.26*  --  3.49* 2.82* 2.68* 2.76*  CALCIUM 8.2*  --  8.6* 8.5* 8.9 8.8*  MG  --  2.3  --  2.1 2.0 2.2  PHOS 5.1*  --  4.8* 3.4 4.2 4.1   Liver Function Tests: Recent Labs  Lab 12/12/22 0154 12/13/22 0331 12/14/22 0117 12/15/22 0640 12/16/22 0228  ALBUMIN 2.5* 2.8* 2.8* 3.0* 3.0*   CBG: Recent Labs  Lab 12/15/22 1143 12/15/22 1719 12/15/22 2054 12/16/22 0747 12/16/22 1228  GLUCAP 170* 203* 173* 133* 241*    Discharge time spent: greater than 30 minutes.  Signed: Lynden Oxford, MD [facility]

## 2022-12-16 NOTE — TOC Transition Note (Signed)
Transition of Care [facility]) - CM/SW Discharge Note   Patient Details  Name: Dana [name] MRN: [id number] Date of Birth: 03-Dec-1960  [facility]) CM/SW Contact:  Tom-Johnson, Hershal Coria, RN Phone Number: 12/16/2022, 11:57 AM   Clinical Narrative:     Patient is scheduled for discharge today.  Readmission Risk Assessment done. Outpatient f/u, hospital f/u and discharge instructions on AVS. No TOC needs or recommendations noted. Family to transport at discharge.  No further TOC needs noted.       Final next level of care: Home/Self Care Barriers to Discharge: Barriers Resolved   Patient Goals and CMS Choice CMS Medicare.gov Compare Post Acute Care list provided to:: Patient Choice offered to / list presented to : Patient  Discharge Placement                  Patient to be transferred to facility by: Family      Discharge Plan and Services Additional resources added to the After Visit Summary for     Discharge Planning Services: CM Consult Post Acute Care Choice: Home Health                               Social Determinants of Health (SDOH) Interventions SDOH Screenings   Food Insecurity: No Food Insecurity (12/06/2022)  Housing: Low Risk  (12/06/2022)  Transportation Needs: No Transportation Needs (12/06/2022)  Utilities: Not At Risk (12/06/2022)  Alcohol Screen: Low Risk  (07/25/2022)  Depression (PHQ2-9): Medium Risk (10/30/2022)  Financial Resource Strain: Medium Risk (10/30/2022)  Physical Activity: Insufficiently Active (10/30/2022)  Social Connections: Moderately Integrated (07/25/2022)  Stress: Stress Concern Present (10/30/2022)  Tobacco Use: Medium Risk (12/13/2022)     Readmission Risk Interventions    12/16/2022   11:54 AM 05/19/2021    1:11 PM 10/06/2020    2:45 PM  Readmission Risk Prevention Plan  Transportation Screening Complete Complete Complete  PCP or Specialist Appt within 3-5 Days   Complete  Social Work Consult for  Recovery Care Planning/Counseling   Complete  Palliative Care Screening   Not Applicable  Medication Review Oceanographer) Referral to Pharmacy Complete Complete  PCP or Specialist appointment within 3-5 days of discharge Complete Complete   HRI or Home Care Consult Complete Complete   SW Recovery Care/Counseling Consult Complete Complete   Palliative Care Screening Not Applicable Not Applicable   Skilled Nursing Facility Not Applicable Not Applicable

## 2022-12-16 NOTE — Progress Notes (Signed)
Cardiology Rounding Note  PCP-Cardiologist: Arvilla Meres, MD   Subjective:    Net -1.6 L yesterday, net -5.5 L on admission  Scr stable, 4.26>>3.49>>2.82>>2.68>>2.76.   BP 120/43.  Denies any chest pain or dyspnea   Objective:   Weight Range: 102 kg Body mass index is 38.6 kg/m.   Vital Signs:   Temp:  [97.6 F (36.4 C)-98 F (36.7 C)] 98 F (36.7 C) (06/02 0313) Pulse Rate:  [71-77] 71 (06/02 0313) Resp:  [17-18] 17 (06/02 0313) BP: (109-120)/(40-56) 120/43 (06/02 0313) SpO2:  [95 %-98 %] 98 % (06/02 0313) Weight:  [102 kg] 102 kg (06/02 0608) Last BM Date : 12/15/22  Weight change: Filed Weights   12/14/22 0329 12/15/22 0404 12/16/22 4098  Weight: 105.2 kg 102.8 kg 102 kg    Intake/Output:   Intake/Output Summary (Last 24 hours) at 12/16/2022 1007 Last data filed at 12/16/2022 0600 Gross per 24 hour  Intake 480 ml  Output 2100 ml  Net -1620 ml       Physical Exam   General:  Well appearing. No respiratory difficulty HEENT: normal Neck: supple. Thick neck, JVD not well visualized Carotids 2+ bilat; no bruits. No lymphadenopathy or thyromegaly appreciated. Cor: PMI nondisplaced. Regular rate & rhythm. No rubs, gallops or murmurs. Lungs: clear Abdomen: obese, soft, nontender, nondistended. No hepatosplenomegaly. No bruits or masses. Good bowel sounds. Extremities: no cyanosis, clubbing, rash, chronic LE edema Neuro: alert & oriented x 3, cranial nerves grossly intact. moves all 4 extremities w/o difficulty. Affect pleasant.  Telemetry   NSR 70s (Personally reviewed)    EKG   N/A  Labs    CBC Recent Labs    12/15/22 0640 12/16/22 0228  WBC 12.7* 15.8*  HGB 8.7* 8.8*  HCT 28.3* 28.3*  MCV 85.5 84.7  PLT 311 337    Basic Metabolic Panel Recent Labs    11/91/47 0640 12/16/22 0228  NA 137 136  K 3.9 3.7  CL 100 98  CO2 24 27  GLUCOSE 102* 110*  BUN 75* 76*  CREATININE 2.68* 2.76*  CALCIUM 8.9 8.8*  MG 2.0 2.2  PHOS 4.2  4.1    Liver Function Tests Recent Labs    12/15/22 0640 12/16/22 0228  ALBUMIN 3.0* 3.0*    No results for input(s): "LIPASE", "AMYLASE" in the last 72 hours. Cardiac Enzymes No results for input(s): "CKTOTAL", "CKMB", "CKMBINDEX", "TROPONINI" in the last 72 hours.  BNP: BNP (last 3 results) Recent Labs    09/07/22 1013 12/06/22 1814 12/08/22 0159  BNP 168.7* 143.5* 149.2*     ProBNP (last 3 results) No results for input(s): "PROBNP" in the last 8760 hours.   D-Dimer No results for input(s): "DDIMER" in the last 72 hours. Hemoglobin A1C No results for input(s): "HGBA1C" in the last 72 hours. Fasting Lipid Panel No results for input(s): "CHOL", "HDL", "LDLCALC", "TRIG", "CHOLHDL", "LDLDIRECT" in the last 72 hours. Thyroid Function Tests No results for input(s): "TSH", "T4TOTAL", "T3FREE", "THYROIDAB" in the last 72 hours.  Invalid input(s): "FREET3"  Other results:   Imaging    No results found.   Medications:     Scheduled Medications:  allopurinol  300 mg Oral Daily   aspirin EC  81 mg Oral Daily   bisacodyl  10 mg Rectal Q1200   busPIRone  5 mg Oral TID   carvedilol  12.5 mg Oral BID   cephALEXin  250 mg Oral QHS   darbepoetin (ARANESP) injection - NON-DIALYSIS  150 mcg Subcutaneous  Q Mon-1800   ezetimibe  10 mg Oral Daily   insulin aspart  0-15 Units Subcutaneous TID WC   insulin aspart  0-5 Units Subcutaneous QHS   insulin detemir  30 Units Subcutaneous Daily   isosorbide mononitrate  60 mg Oral Daily   linaclotide  145 mcg Oral QAC breakfast   pantoprazole  40 mg Oral Daily   polyethylene glycol  17 g Oral Daily   sodium chloride flush  3 mL Intravenous Q12H   ticagrelor  90 mg Oral BID   torsemide  80 mg Oral Daily    Infusions:  sodium chloride Stopped (12/09/22 2245)    PRN Medications: sodium chloride, acetaminophen, bisacodyl, diphenhydrAMINE, HYDROcodone-acetaminophen, lip balm, LORazepam, menthol-cetylpyridinium,  nitroGLYCERIN, ondansetron (ZOFRAN) IV, prochlorperazine, sodium chloride flush    Patient Profile   62 YO w/ CAD (NSTEMI in 3/22), HFpEF, T2DM (hx of DKA), hx of PEA arrest, CKDIII-IV, severe MR s/p TEER (11/22), s/p cardiomems, PAD s/p L great toe and 4th toe amputation admitted on 12/08/22 w/ unstable angina associated w/ lateral ST-T changes on EKG. Lab work notable for sCr of 2.7 on admit. Now s/p balloon angioplasty to LAD stent on 12/08/22. Hospital course c/b AKI on CKD.  Assessment/Plan   1. Chest Pain-->CAD-->NSTEM  H/P multiple PCI with stents to mid LAD and pRCA.  - NSTEMI (2/22). Cath deferred due to AKI. Treated medically.  - NSTEMI (7/22)-->S/p PCI/DES to mid Cx, balloon angioplasty to OM1  - LHC -->LAD DES, severe 85% ostial prox RCA will need eventual intervention but renal function precludes.  - On aspirin, coreg, brilinta - Allergy statins-->muscle pain.  On zetia and PSK9i.    2. AKI Post cath with worsening renal function. Given 60 cc contrast.  Admit Creatinine 2.7-->Scr peaked to 4.25. Had similar events after cath in 2022 with SCr peaking at 5.  Nephrology following. Making urine, SCr trending down, 2.76 today.  Avoid hypotension.    3. HFpEF , ICM  Has cardiomems.   Echo this admit EF 50-55%  RV mildy reduced.  - Looking back she had similar event in 2022 with gradual improvement.  - 5/28 she had bendopena/orthopnea, now resolved. - Volume status continues to improve. - Continue coreg 12.5 mg BID.  - Currently getting treated for UTI will need to avoid SGLT2i.  - Appears euvolemic, transition to PO torsemide 80mg  today   4. MR  2022 Had TEER- 1XT W and 1NT W clip on 05/18/2021. NT W clip was entangled and had to be deployed into lateral part of valve. No additional clip placement could be preformed. Echo this admit-->Post mitral clip x 2 with XTW and NTW the latter entangled in chords no obvious SLD noted mean gradient 5 mmHg. Mild residual MR appears medial  to the two clips    5. UTI  -On Keflex Hold off on SGLT2i   6. OSA Continue nightly CPAP   7. DMII On SSI   8. Anemia, chronic  Iron stats 7%. S/p IV iron 5/29 and again 5/31  7. Constipation   - bowel regimen   Length of Stay: 9  Little Ishikawa, MD  12/16/2022, 10:07 AM

## 2022-12-17 ENCOUNTER — Ambulatory Visit (INDEPENDENT_AMBULATORY_CARE_PROVIDER_SITE_OTHER): Payer: PPO

## 2022-12-17 ENCOUNTER — Encounter: Payer: Self-pay | Admitting: Family Medicine

## 2022-12-17 ENCOUNTER — Telehealth (INDEPENDENT_AMBULATORY_CARE_PROVIDER_SITE_OTHER): Payer: PPO | Admitting: Family Medicine

## 2022-12-17 ENCOUNTER — Ambulatory Visit: Payer: Self-pay | Admitting: Licensed Clinical Social Worker

## 2022-12-17 VITALS — BP 117/53 | HR 72 | Ht 64.5 in | Wt 229.0 lb

## 2022-12-17 DIAGNOSIS — F411 Generalized anxiety disorder: Secondary | ICD-10-CM

## 2022-12-17 DIAGNOSIS — F330 Major depressive disorder, recurrent, mild: Secondary | ICD-10-CM

## 2022-12-17 DIAGNOSIS — Z89422 Acquired absence of other left toe(s): Secondary | ICD-10-CM

## 2022-12-17 DIAGNOSIS — J9611 Chronic respiratory failure with hypoxia: Secondary | ICD-10-CM

## 2022-12-17 DIAGNOSIS — I2510 Atherosclerotic heart disease of native coronary artery without angina pectoris: Secondary | ICD-10-CM

## 2022-12-17 DIAGNOSIS — I130 Hypertensive heart and chronic kidney disease with heart failure and stage 1 through stage 4 chronic kidney disease, or unspecified chronic kidney disease: Secondary | ICD-10-CM

## 2022-12-17 DIAGNOSIS — K219 Gastro-esophageal reflux disease without esophagitis: Secondary | ICD-10-CM | POA: Diagnosis not present

## 2022-12-17 DIAGNOSIS — E782 Mixed hyperlipidemia: Secondary | ICD-10-CM

## 2022-12-17 DIAGNOSIS — E1142 Type 2 diabetes mellitus with diabetic polyneuropathy: Secondary | ICD-10-CM | POA: Diagnosis not present

## 2022-12-17 DIAGNOSIS — I214 Non-ST elevation (NSTEMI) myocardial infarction: Secondary | ICD-10-CM

## 2022-12-17 DIAGNOSIS — N184 Chronic kidney disease, stage 4 (severe): Secondary | ICD-10-CM | POA: Diagnosis not present

## 2022-12-17 DIAGNOSIS — I200 Unstable angina: Secondary | ICD-10-CM

## 2022-12-17 DIAGNOSIS — I509 Heart failure, unspecified: Secondary | ICD-10-CM

## 2022-12-17 DIAGNOSIS — F439 Reaction to severe stress, unspecified: Secondary | ICD-10-CM

## 2022-12-17 MED ORDER — BUSPIRONE HCL 10 MG PO TABS: 10.0000 mg | ORAL_TABLET | Freq: Three times a day (TID) | ORAL | 2 refills | Status: DC

## 2022-12-17 NOTE — Patient Instructions (Signed)
Visit Information  Thank you for taking time to visit with me today. Please don't hesitate to contact me if I can be of assistance to you.   Following are the goals we discussed today:   Goals Addressed             This Visit's Progress    Patient Stated she discharged yesterday from the hospital. Is recovering from procedure. Enjoys being at her home       Interventions:  Discussed client needs with client She said she was hospitalized for 12 days. She discharged from hospital yesterday and returned home yesterday. Discussed support system. She has support from her spouse, Shiara Cloyd Discussed walking of client. She said she does not need to use walking device at home. In the community she uses a cane or a walker as needed. She also has a wheelchair to use if needed Discussed medication procurement Discussed mood of client. She said she does get sad occasionally. She does take Buspar and Ativan as prescribed. She said these medications are beneficial to her. She likes to relax by listening to music, crocheting and participating in Bible Study activities Discussed hand mobility and hand dexterity Discussed appointments upcoming. She has video appointment today with PCP. She has appointment with nephrologist on January 01, 2023 Provided counseling support for client Discussed vision of client. She does wear glasses Discussed appetite. She does have reduced appetite (per client information) Encouraged Kerma to call LCSW as needed for SW support at [phone].          Our next appointment is by telephone on 01/21/23 at 10:30 AM   Please call the care guide team at [phone] if you need to cancel or reschedule your appointment.   If you are experiencing a Mental Health or Behavioral Health Crisis or need someone to talk to, please go to [facility] [address] [place] [phone])   The patient verbalized understanding of  instructions, educational materials, and care plan provided today and DECLINED offer to receive copy of patient instructions, educational materials, and care plan.   The patient has been provided with contact information for the care management team and has been advised to call with any health related questions or concerns.   Kelton Pillar.Ariahna Smiddy MSW, LCSW Licensed Visual merchandiser [facility] Care Management [phone]

## 2022-12-17 NOTE — Consult Note (Addendum)
   [facility] Inpatient Consult   12/17/2022  ELAYNE TOLSMA 1960/08/11 [id number]  [facility] [[facility]]  Accountable Care Organization [ACO] Patient: [name]  Primary Care Provider: Blane Ohara, MD  Patient is currently active with [facility] [[facility]] Care Management for chronic disease management services.  Patient has been engaged by a [facility] RN Chronic Care Manager and LCSW.  Our community based plan of care has focused on disease management and community resource support.    Patient will receive a post hospital call and will be evaluated for assessments and disease process education.    Plan:  Care Coordination - alert [facility] RN CCM of patient admission.  Patient noted with a [facility] CSW appointment 12/17/22 at 11:00 am noted [corrected time].  Of note, [facility] Care Management services does not replace or interfere with any services that are needed or arranged by inpatient [facility] care management team.   For additional questions or referrals please contact:  Charlesetta Shanks, RN BSN CCM [facility]  [phone] business mobile phone Toll free office [phone]  *Concierge Line  [phone] Fax number: [phone] [email or web address]

## 2022-12-17 NOTE — Patient Outreach (Signed)
Care Coordination   Follow Up Visit Note   12/17/2022 Name: Dana [name] MRN: [id number] DOB: [birth year]  Dana [name] is a 62 y.o. year old female who sees Cox, Kirsten, MD for primary care. I spoke with  Dana [name] by phone today.  What matters to the patients health and wellness today? Patient discharged yesterday from the hospital. She is recovering from procedure. Enjoys being at her home.     Goals Addressed             This Visit's Progress    Patient Stated she discharged yesterday from the hospital. Is recovering from procedure. Enjoys being at her home       Interventions:  Discussed client needs with client She said she was hospitalized for 12 days. She discharged from hospital yesterday and returned home yesterday. Discussed support system. She has support from her spouse, Dana [name] Discussed walking of client. She said she does not need to use walking device at home. In the community she uses a cane or a walker as needed. She also has a wheelchair to use if needed Discussed medication procurement Discussed mood of client. She said she does get sad occasionally. She does take Buspar and Ativan as prescribed. She said these medications are beneficial to her. She likes to relax by listening to music, crocheting and participating in Bible Study activities Discussed hand mobility and hand dexterity Discussed appointments upcoming. She has video appointment today with PCP. She has appointment with nephrologist on January 01, 2023 Provided counseling support for client Discussed vision of client. She does wear glasses Discussed appetite. She does have reduced appetite (per client information) Encouraged Dana [name] to call LCSW as needed for SW support at [phone].          SDOH assessments and interventions completed:  Yes  SDOH Interventions Today    Flowsheet Row Most Recent Value  SDOH Interventions   Depression Interventions/Treatment  Medication,  Counseling  Physical Activity Interventions Other (Comments)  [she uses a cane as needed to help her walk]  Stress Interventions Provide Counseling  [client has stress in managing medical needs.]        Care Coordination Interventions:  Yes, provided   Interventions Today    Flowsheet Row Most Recent Value  Chronic Disease   Chronic disease during today's visit Other  [spoke with client about client needs]  General Interventions   General Interventions Discussed/Reviewed [employer]  [discussed program support]  Exercise Interventions   Exercise Discussed/Reviewed Physical Activity  [she said she uses a cane to help her walk]  Physical Activity Discussed/Reviewed Physical Activity Reviewed  [client does have DME equipment to help her walk,  she has cane, has a walker and has a wheelchair]  Education Interventions   Education Provided Provided Education  Provided Verbal Education On [employer]  Mental Health Interventions   Mental Health Discussed/Reviewed Anxiety, Coping Strategies  [discussed client mood. she said she does get a little sad sometimes. She said she is taking Buspar and Ativan as prescribed. She said these medications are beneficial to her]  Nutrition Interventions   Nutrition Discussed/Reviewed Nutrition Discussed  Dana [name] said she sometimes has decreased appetite]  Pharmacy Interventions   Pharmacy Dicussed/Reviewed Pharmacy Topics Discussed        Follow up plan: Follow up call scheduled for 01/21/23 at 10:30 AM    Encounter Outcome:  Pt. Visit Completed   Dana [name] MSW, LCSW Licensed Visual merchandiser [facility] Care Management [phone]

## 2022-12-17 NOTE — Patient Instructions (Signed)
Please call the care guide team at [phone] if you need to cancel or reschedule your appointment.   If you are experiencing a Mental Health or Behavioral Health Crisis or need someone to talk to, please call the Suicide and Crisis Lifeline: 988 call the [place] National Suicide Prevention Lifeline: 681-719-0197 or TTY: 561-500-8888 TTY 580-437-0419) to talk to a trained counselor call 1-800-273-TALK (toll free, 24 hour hotline) go to [facility] [address], [place] [phone])   Following is a copy of the CCM Program Consent:  CCM service includes personalized support from designated clinical staff supervised by the physician, including individualized plan of care and coordination with other care providers 24/7 contact phone numbers for assistance for urgent and routine care needs. Service will only be billed when office clinical staff spend 20 minutes or more in a month to coordinate care. Only one practitioner may furnish and bill the service in a calendar month. The patient may stop CCM services at amy time (effective at the end of the month) by phone call to the office staff. The patient will be responsible for cost sharing (co-pay) or up to 20% of the service fee (after annual deductible is met)  Following is a copy of your full provider care plan:  PCP Follow-up appointment confirmed?: Yes Date of PCP follow-up appointment?: 12/17/22 Follow-up Provider: DR. Blane Ohara Specialist [facility] Follow-up appointment confirmed?: No Reason Specialist Follow-Up Not Confirmed: Patient has Specialist Provider Number and will Call for Appointment (Patient is calling this am to get a follow up appointment with the nephrologist) Do you need transportation to your follow-up appointment?: No Do you understand care options if your condition(s) worsen?: Yes-patient verbalized understanding   Patient verbalizes understanding of instructions and care plan  provided today and agrees to view in MyChart. Active MyChart status and patient understanding of how to access instructions and care plan via MyChart confirmed with patient.  Telephone follow up appointment with care management team member scheduled for: 01-30-2023 at 230 pm

## 2022-12-17 NOTE — Progress Notes (Unsigned)
Subjective:  Patient ID: TU FERENCE, female    DOB: 07-06-1961  Age: 62 y.o. MRN: [id number]  Chief Complaint  Patient presents with   Medical Management of Chronic Issues    HPI   Diabetes: Managed by Dr. Katrinka Blazing Complications: neuropathy.  Glucose checking:free style libre  Glucose logs:100's while in hospital highest was 241. Checks sugar at 4 times a day if not more. Most recent A1C: 7.4 Current medications: Levemir 30 units daily, Novolog 2-10 units on sliding scale before meals.  Last Eye Exam: 3/24 Foot checks: daily   Hyperlipidemia: Current medications: Repatha 140 mg every other week, Zetia 10 mg daily, Fish oil 1000 mg daily.    Depression/GAD: Buspar 5 mg three times a day and Lorazepam 0.5 mg once daily,    Hypertension: Complications: chf, cad, ckd Current medications: Carvedilol 12.5 mg two times daily, Imdur 60 mg daily, Torsemide 80 mg daily, Potassium 20 meq once daily, Brilanta 90 mg TWICE DAILY. See's Dr. Patrica Duel [facility]. Weighs daily.    Hypomagnesium: Mag oxide 400 mg 2 daily.    GOUT: Allopurinol 300 mg once daily.   GERD: Protonix 40 mg once daily.    B12 deficiency:  B12 gummies 1200 mg daily.   Diet: healthy Exercise: chair   Chronic respiratory failure with hypoxia and OSA: On cpap and 2 L oxygen at night.  Recently discharged from hospital was admitted for 12 days. Admitted 12/06/22 and d/c 12/16/2022 Hospital Course: 62 year old PMH of CAD SP PCI, PAF SP ablation, MR S/P MitraClip, OSA on CPAP, obesity, chronic respiratory failure, CKD stage IIIb with baseline creatinine about 1.9, anemia, HTN, HLD, gout, GERD, anxiety, depression, chronic HFpEF. Presents with complaints of chest pain.  Found to have AKI on CKD 3B.  Also non-STEMI with 85% ostial proximal RCA stenosis Cardiology, nephrology, advanced heart failure services consulted. Renal function mildly elevated.  Patient significantly better.  Eager to go  home on 6/2.  Nephrology signed off.  Cardiology was okay with discharge.      12/17/2022    3:41 PM 12/17/2022   11:43 AM 10/30/2022    2:26 PM 09/25/2022   10:55 AM 08/31/2022    7:40 AM  Depression screen PHQ 2/9  Decreased Interest 0 1 1 1 1   Down, Depressed, Hopeless 1 1 1 1 1   PHQ - 2 Score 1 2 2 2 2   Altered sleeping 3 1 1 1 2   Tired, decreased energy 3 1 1 1 3   Change in appetite 1 1 1 1 1   Feeling bad or failure about yourself  0 1 0 0 0  Trouble concentrating 0 0 0 0 0  Moving slowly or fidgety/restless 0 1 1 1 1   Suicidal thoughts 0 0 0 0 0  PHQ-9 Score 8 7 6 6 9   Difficult doing work/chores Somewhat difficult Somewhat difficult Somewhat difficult Somewhat difficult         12/17/2022    3:41 PM  Fall Risk   Falls in the past year? 1  Number falls in past yr: 1  Injury with Fall? 0  Risk for fall due to : No Fall Risks  Follow up Falls evaluation completed    Patient Care Team: Blane Ohara, MD as PCP - General (Family Medicine) Bensimhon, Bevelyn Buckles, MD as PCP - Cardiology (Cardiology) Marcelyn Bruins, MD as Consulting Physician (Allergy) Bensimhon, Bevelyn Buckles, MD as Consulting Physician (Cardiology) Beverly Gust, MD as Referring Physician (Gastroenterology) [place]  Jacquenette Shone, MD as Consulting Physician (Ophthalmology) Doristine Bosworth., MD as Consulting Physician (Endocrinology) Margarette Canada., DPM as Consulting Physician (Podiatry) Donnetta Hail, MD as Consulting Physician (Rheumatology) Barnabas Lister, MD as Referring Physician (Nephrology) Zettie Pho, [facility] as Pharmacist (Pharmacist) Zettie Pho, [facility] (Pharmacist) Orbie Pyo, MD as Consulting Physician (Cardiology) Dellia Beckwith, MD as Consulting Physician (Oncology) Marisue Brooklyn, MD as Referring Physician Doristine Bosworth., MD (Endocrinology) Marlowe Sax, RN as Case Manager (General Practice)   Review of Systems  Constitutional:  Negative for  chills, fatigue and fever.  HENT:  Negative for congestion, ear pain, rhinorrhea and sore throat.   Respiratory:  Negative for cough and shortness of breath.   Cardiovascular:  Negative for chest pain.  Gastrointestinal:  Negative for abdominal pain, constipation, diarrhea, nausea and vomiting.  Genitourinary:  Positive for dysuria (Taking keflex) and frequency. Negative for urgency.  Musculoskeletal:  Positive for back pain. Negative for myalgias.  Neurological:  Negative for dizziness, weakness, light-headedness and headaches.  Psychiatric/Behavioral:  Negative for dysphoric mood. The patient is not nervous/anxious.     Current Outpatient Medications on File Prior to Visit  Medication Sig Dispense Refill   allopurinol (ZYLOPRIM) 300 MG tablet Take 1 tablet (300 mg total) by mouth daily. 90 tablet 1   aspirin EC 81 MG tablet Take 1 tablet (81 mg total) by mouth daily. Swallow whole. 30 tablet 12   busPIRone (BUSPAR) 5 MG tablet Take 1 tablet (5 mg total) by mouth 3 (three) times daily. 270 tablet 1   carvedilol (COREG) 12.5 MG tablet TAKE ONE TABLET BY MOUTH TWICE DAILY (Patient taking differently: Take 12.5 mg by mouth 2 (two) times daily with a meal.) 180 tablet 3   cephALEXin (KEFLEX) 250 MG capsule Take 250 mg by mouth at bedtime.     Continuous Blood Gluc Sensor (FREESTYLE LIBRE 2 SENSOR) MISC USE TO check blood glucose AS DIRECTED AND CHANGE sensor every 14 DAYS     CRANBERRY PO Take 2 tablets by mouth daily.     EPINEPHrine 0.3 mg/0.3 mL IJ SOAJ injection Use as directed for life-threatening allergic reaction. 4 Device 3   estradiol (ESTRACE) 0.1 MG/GM vaginal cream Place 1 Applicatorful vaginally every other day.     Evolocumab (REPATHA SURECLICK) 140 MG/ML SOAJ Inject 1 Dose into the skin every 14 (fourteen) days. 6 mL 1   ezetimibe (ZETIA) 10 MG tablet Take 1 tablet (10 mg total) by mouth daily. 90 tablet 1   HYDROcodone-acetaminophen (NORCO) 7.5-325 MG tablet TAKE ONE TABLET BY  MOUTH THREE TIMES DAILY AS NEEDED (Patient taking differently: Take 1 tablet by mouth 3 (three) times daily as needed for moderate pain.) 90 tablet 0   insulin aspart (NOVOLOG FLEXPEN) 100 UNIT/ML FlexPen 2-10 U before meals based on SSI. 15 mL 3   Insulin Pen Needle (BD PEN NEEDLE NANO 2ND GEN) 32G X 4 MM MISC Inject 1 each into the skin in the morning, at noon, in the evening, and at bedtime. 600 each 3   isosorbide mononitrate (IMDUR) 60 MG 24 hr tablet Take 1 tablet (60 mg total) by mouth daily. 30 tablet 0   latanoprost (XALATAN) 0.005 % ophthalmic solution Place 1 drop into both eyes at bedtime.     LEVEMIR FLEXPEN 100 UNIT/ML FlexPen Inject 30 Units into the skin daily.     linaclotide (LINZESS) 145 MCG CAPS capsule Take 1 capsule (145 mcg total) by mouth daily before breakfast.  90 capsule 1   LORazepam (ATIVAN) 0.5 MG tablet TAKE ONE TABLET BY MOUTH ONCE DAILY AS NEEDED FOR ANXIETY (Patient taking differently: Take 0.5 mg by mouth every 6 (six) hours as needed for anxiety.) 30 tablet 2   magnesium oxide (MAG-OX) 400 MG tablet Take 1 tablet (400 mg total) by mouth daily. Take 2 (400 mg) daily=800 mg 60 tablet 1   nitroGLYCERIN (NITROSTAT) 0.4 MG SL tablet Dissolve 1 tab under tongue as needed for chest pain. May repeat every 5 minutes x 2 doses. If no relief call 9-1-1. Please call [phone] to schedule an appointment for future refills. Thank you. 50 tablet 0   Omega-3 Fatty Acids (FISH OIL PO) Take 1 capsule by mouth daily.     OXYGEN Inhale 2 L into the lungs at bedtime.     pantoprazole (PROTONIX) 40 MG tablet Take 1 tablet (40 mg total) by mouth daily. 90 tablet 1   polyethylene glycol (MIRALAX / GLYCOLAX) 17 g packet Take 17 g by mouth daily. 14 each 0   potassium chloride SA (KLOR-CON M) 20 MEQ tablet Take 1 tablet (20 mEq total) by mouth 2 (two) times a week. 90 tablet 1   Probiotic CAPS Take 1 capsule by mouth daily.     promethazine (PHENERGAN) 25 MG tablet Take 1 tablet (25 mg  total) by mouth daily as needed. (Patient taking differently: Take 25 mg by mouth daily as needed for nausea or vomiting.) 30 tablet 3   ticagrelor (BRILINTA) 90 MG TABS tablet Take 1 tablet (90 mg total) by mouth 2 (two) times daily. 60 tablet 0   torsemide (DEMADEX) 20 MG tablet TAKE 4 TABLETS BY MOUTH ONCE DAILY (Patient taking differently: Take 80 mg by mouth daily.) 336 tablet 3   No current facility-administered medications on file prior to visit.   Past Medical History:  Diagnosis Date   Acquired absence of left great toe (HCC)    Acquired absence of other left toe(s) (HCC)    ACS (acute coronary syndrome) (HCC)    Anemia    Anoxic brain injury (HCC)    Anxiety    CAD (coronary artery disease)    DES mLAD 04/07/15; DES dRCA & DES pPDA 08/30/16; DES mCX & PTCA OM1 09/29/20   Chronic diastolic (congestive) heart failure (HCC)    Chronic pain    Chronic systolic (congestive) heart failure (HCC)    CKD (chronic kidney disease)    Contrast dye induced nephropathy, possible 09/03/2020   COPD (chronic obstructive pulmonary disease) (HCC)    Diabetes (HCC)type 1    Diastolic CHF (HCC)    Dyspnea    Family history of breast cancer 10/23/2021   Gastro-esophageal reflux disease without esophagitis    Hyperlipidemia    Hypertension    Hypertensive heart disease with heart failure (HCC)    Hypoxemia    Idiopathic gout, multiple sites    Leukocytosis 03/29/2022   Localized edema    Low back pain    Mitral regurgitation    Mitral regurgitation    Mixed hyperlipidemia    Mixed incontinence    Morbid (severe) obesity due to excess calories (HCC)    Neuromuscular disorder (HCC)    Neuropathy    Non-ST elevation (NSTEMI) myocardial infarction (HCC)    08/29/20, 09/29/20   OSA (obstructive sleep apnea) 09/03/2020   Other specified hypothyroidism    PAF (paroxysmal atrial fibrillation) (HCC)    s/p pulmonary vein isolation by cryoablation 10/04/15 Dr. Rudolpho Sevin [facility]   PEA (Pulseless  electrical activity) (HCC)    ~ 01/13/21 at [facility] during admission DKA, volume overload, possible CAP, hypoxia s/p ACLS with ROSC ~ 10-15   Pneumonia    Primary insomnia    Rheumatoid arthritis (HCC)    Stroke (HCC)    Thyroid disease    Type 1 diabetes mellitus (HCC)    Vitamin D deficiency    Past Surgical History:  Procedure Laterality Date   ABDOMINAL HYSTERECTOMY     Partial- Still has both ovaries   CARDIOVASCULAR STRESS TEST  08/13/2014   Nuclear; Normal   CARPAL TUNNEL RELEASE     CATARACT EXTRACTION, BILATERAL     CESAREAN SECTION     CHOLECYSTECTOMY     CORONARY ANGIOPLASTY WITH STENT PLACEMENT     3 blockages/ 1 stent   CORONARY PRESSURE/FFR STUDY N/A 09/07/2022   Procedure: INTRAVASCULAR PRESSURE WIRE/FFR STUDY;  Surgeon: Swaziland, Peter M, MD;  Location: [facility] INVASIVE CV LAB;  Service: Cardiovascular;  Laterality: N/A;   CORONARY STENT INTERVENTION N/A 09/29/2020   Procedure: CORONARY STENT INTERVENTION;  Surgeon: Tonny Bollman, MD;  Location: [facility] INVASIVE CV LAB;  Service: Cardiovascular;  Laterality: N/A;  CFX   CORONARY STENT INTERVENTION N/A 09/07/2022   Procedure: CORONARY STENT INTERVENTION;  Surgeon: Swaziland, Peter M, MD;  Location: [facility] INVASIVE CV LAB;  Service: Cardiovascular;  Laterality: N/A;   CORONARY/GRAFT ACUTE MI REVASCULARIZATION N/A 12/08/2022   Procedure: Coronary/Graft Acute MI Revascularization;  Surgeon: Marykay Lex, MD;  Location: [facility] INVASIVE CV LAB;  Service: Cardiovascular;  Laterality: N/A;   EYE SURGERY     LEFT HEART CATH AND CORONARY ANGIOGRAPHY N/A 09/29/2020   Procedure: LEFT HEART CATH AND CORONARY ANGIOGRAPHY;  Surgeon: Tonny Bollman, MD;  Location: [facility] INVASIVE CV LAB;  Service: Cardiovascular;  Laterality: N/A;   LEFT HEART CATH AND CORONARY ANGIOGRAPHY N/A 09/07/2022   Procedure: LEFT HEART CATH AND CORONARY ANGIOGRAPHY;  Surgeon: Swaziland, Peter M, MD;  Location: [facility] INVASIVE CV LAB;  Service: Cardiovascular;  Laterality: N/A;    LEFT HEART CATH AND CORONARY ANGIOGRAPHY N/A 12/08/2022   Procedure: LEFT HEART CATH AND CORONARY ANGIOGRAPHY;  Surgeon: Marykay Lex, MD;  Location: [facility] INVASIVE CV LAB;  Service: Cardiovascular;  Laterality: N/A;   MITRAL VALVE REPAIR N/A 05/18/2021   Procedure: MITRAL VALVE REPAIR;  Surgeon: Orbie Pyo, MD;  Location: [facility] INVASIVE CV LAB;  Service: Cardiovascular;  Laterality: N/A;   RIGHT HEART CATH N/A 04/27/2021   Procedure: RIGHT HEART CATH;  Surgeon: Dolores Patty, MD;  Location: [facility] INVASIVE CV LAB;  Service: Cardiovascular;  Laterality: N/A;   RIGHT/LEFT HEART CATH AND CORONARY ANGIOGRAPHY N/A 01/07/2017   Procedure: Right/Left Heart Cath and Coronary Angiography;  Surgeon: Laurey Morale, MD;  Location: [facility] INVASIVE CV LAB;  Service: Cardiovascular;  Laterality: N/A;   ROTATOR CUFF REPAIR     TEE WITHOUT CARDIOVERSION N/A 04/28/2021   Procedure: TRANSESOPHAGEAL ECHOCARDIOGRAM (TEE);  Surgeon: Dolores Patty, MD;  Location: [facility] ENDOSCOPY;  Service: Cardiovascular;  Laterality: N/A;   TEE WITHOUT CARDIOVERSION N/A 05/18/2021   Procedure: TRANSESOPHAGEAL ECHOCARDIOGRAM (TEE);  Surgeon: Orbie Pyo, MD;  Location: [facility] INVASIVE CV LAB;  Service: Cardiovascular;  Laterality: N/A;   TOE AMPUTATION Left    US ECHOCARDIOGRAPHY  05/2017   Normal    Family History  Problem Relation Age of Onset   Breast cancer Mother 70       contralateral breast ca dx 14   Coronary  artery disease Mother    Hypertension Mother    Hyperlipidemia Mother    Diabetes type II Mother    Lung cancer Mother 61   Diabetes Father    Hypertension Father    Mental illness Sister    Bipolar disorder Other    Depression Other    Schizophrenia Other    Cancer Other        MGF's sisters x2; unknown type   Social History   Socioeconomic History   Marital status: Married    Spouse name: [name]   Number of children: 2   Years of education: 16   Highest education level: Master's degree (e.g., MA,  MS, MEng, MEd, MSW, MBA)  Occupational History   Occupation: on disability/retired early former Engineer, site  Tobacco Use   Smoking status: Former    Types: Cigarettes    Start date: 1983    Quit date: 1984    Years since quitting: 40.4    Passive exposure: Past   Smokeless tobacco: Never  Vaping Use   Vaping Use: Never used  Substance and Sexual Activity   Alcohol use: Never   Drug use: Never   Sexual activity: Not on file  Other Topics Concern   Not on file  Social History Narrative   Not on file   Social Determinants of Health   Financial Resource Strain: Medium Risk (10/30/2022)   Overall Financial Resource Strain (CARDIA)    Difficulty of Paying Living Expenses: Somewhat hard  Food Insecurity: No Food Insecurity (12/06/2022)   Hunger Vital Sign    Worried About Running Out of Food in the Last Year: Never true    Ran Out of Food in the Last Year: Never true  Transportation Needs: No Transportation Needs (12/06/2022)   PRAPARE - Administrator, Civil Service (Medical): No    Lack of Transportation (Non-Medical): No  Physical Activity: Inactive (12/17/2022)   Exercise Vital Sign    Days of Exercise per Week: 0 days    Minutes of Exercise per Session: 0 min  Stress: Stress Concern Present (12/17/2022)   [employer] of Occupational Health - Occupational Stress Questionnaire    Feeling of Stress : To some extent  Social Connections: Moderately Integrated (07/25/2022)   Social Connection and Isolation Panel [NHANES]    Frequency of Communication with Friends and Family: More than three times a week    Frequency of Social Gatherings with Friends and Family: More than three times a week    Attends Religious Services: More than 4 times per year    Active Member of Clubs or Organizations: No    Attends Banker Meetings: Never    Marital Status: Married    Objective:  Ht 5' 4.5" (1.638 m)   Wt 229 lb (103.9 kg)   BMI 38.70 kg/m      12/17/2022     3:46 PM 12/16/2022   12:22 PM 12/16/2022    6:08 AM  BP/Weight  Systolic BP  128   Diastolic BP  53   Wt. (Lbs) 229  224.9  BMI 38.7 kg/m2  38.6 kg/m2    Physical Exam  Diabetic Foot Exam - Simple   No data filed      Lab Results  Component Value Date   WBC 15.8 (H) 12/16/2022   HGB 8.8 (L) 12/16/2022   HCT 28.3 (L) 12/16/2022   PLT 337 12/16/2022   GLUCOSE 110 (H) 12/16/2022   CHOL 134 08/31/2022  TRIG 109 08/31/2022   HDL 48 08/31/2022   LDLCALC 66 08/31/2022   ALT 18 12/06/2022   AST 17 12/06/2022   NA 136 12/16/2022   K 3.7 12/16/2022   CL 98 12/16/2022   CREATININE 2.76 (H) 12/16/2022   BUN 76 (H) 12/16/2022   CO2 27 12/16/2022   TSH 2.900 01/19/2022   INR 1.1 05/16/2021   HGBA1C 7.4 (H) 08/31/2022      Assessment & Plan:    GAD (generalized anxiety disorder)  Depression, major, recurrent, mild (HCC)  Hypertensive heart and renal disease with congestive heart failure (HCC)  Diabetic polyneuropathy associated with type 2 diabetes mellitus (HCC)  GERD without esophagitis  Mixed hyperlipidemia     No orders of the defined types were placed in this encounter.   No orders of the defined types were placed in this encounter.    Follow-up: Return in about 3 months (around 03/19/2023) for chronic, fasting.   I,Katherina A Bramblett,acting as a scribe for Blane Ohara, MD.,have documented all relevant documentation on the behalf of Blane Ohara, MD,as directed by  Blane Ohara, MD while in the presence of Blane Ohara, MD.   An After Visit Summary was printed and given to the patient.  Blane Ohara, MD [facility] [phone]

## 2022-12-17 NOTE — Transitions of Care (Post Inpatient/ED Visit) (Cosign Needed)
12/17/2022  Name: Dana [name] MRN: [id number] DOB: 21-Apr-1961  Today's TOC FU Call Status: Today's TOC FU Call Status:: Successful TOC FU Call Competed TOC FU Call Complete Date: 12/17/22  Transition Care Management Follow-up Telephone Call Date of Discharge: 12/16/22 Discharge Facility: [facility]) Type of Discharge: Inpatient Admission Primary Inpatient Discharge Diagnosis:: unstable angina, heart attack, catheterization How have you been since you were released from the hospital?: Better Any questions or concerns?: No  Items Reviewed: Did you receive and understand the discharge instructions provided?: Yes Medications obtained,verified, and reconciled?: Yes (Medications Reviewed) Any new allergies since your discharge?: No Dietary orders reviewed?: Yes Type of Diet Ordered:: Heart Healthy/ADA diet Do you have support at home?: Yes People in Home: spouse Name of Support/Comfort Primary Source: Dana [name]- husband  Medications Reviewed Today: Medications Reviewed Today     Reviewed by Dana Sax, RN (Case Manager) on 12/17/22 at 1010  Med List Status: <None>   Medication Order Taking? Sig Documenting Provider Last Dose Status Informant  allopurinol (ZYLOPRIM) 300 MG tablet [id number] Yes Take 1 tablet (300 mg total) by mouth daily. Dana Mandes, MD Taking Active Self  aspirin EC 81 MG tablet [id number] Yes Take 1 tablet (81 mg total) by mouth daily. Swallow whole. Dana Bender, NP Taking Active Self  busPIRone (BUSPAR) 5 MG tablet [id number] Yes Take 1 tablet (5 mg total) by mouth 3 (three) times daily. Cox, Kirsten, MD Taking Active Self  carvedilol (COREG) 12.5 MG tablet [id number] Yes TAKE ONE TABLET BY MOUTH TWICE DAILY  Patient taking differently: Take 12.5 mg by mouth 2 (two) times daily with a meal.   [name], Dana Malta, FNP Taking Active Self  cephALEXin (KEFLEX) 250 MG capsule [id number] Yes Take 250 mg by mouth at bedtime. [provider] Taking Active  Self           Med Note ([name], Dana [name]   Thu Dec 06, 2022  8:18 PM) Continues   Continuous Blood Gluc Sensor (FREESTYLE LIBRE 2 SENSOR) [place] [id number] Yes USE TO check blood glucose AS DIRECTED AND CHANGE sensor every 14 DAYS [provider] Taking Active Self  CRANBERRY PO [id number] Yes Take 2 tablets by mouth daily. [provider] Taking Active Self  EPINEPHrine 0.3 mg/0.3 mL IJ SOAJ injection [id number] Yes Use as directed for life-threatening allergic reaction. Dana Bruins, MD Taking Active Self  estradiol (ESTRACE) 0.1 MG/GM vaginal cream [id number] Yes Place 1 Applicatorful vaginally every other day. [provider] Taking Active Self  Evolocumab (REPATHA SURECLICK) 140 MG/ML SOAJ [id number] Yes Inject 1 Dose into the skin every 14 (fourteen) days. Dana Nose, MD Taking Active Self           Med Note ([name], Dana Berger Dec 06, 2022  8:13 PM) Next dose due is 12-10-22  ezetimibe (ZETIA) 10 MG tablet [id number] Yes Take 1 tablet (10 mg total) by mouth daily. Cox, Kirsten, MD Taking Active Self  HYDROcodone-acetaminophen (NORCO) 7.5-325 MG tablet [id number] Yes TAKE ONE TABLET BY MOUTH THREE TIMES DAILY AS NEEDED  Patient taking differently: Take 1 tablet by mouth 3 (three) times daily as needed for moderate pain.   Cox, Kirsten, MD Taking Active Self  insulin aspart (NOVOLOG FLEXPEN) 100 UNIT/ML FlexPen [id number] Yes 2-10 U before meals based on SSI. Cox, Kirsten, MD Taking Active Self  Insulin Pen Needle (BD PEN NEEDLE NANO 2ND GEN) 32G X 4 MM MISC [id number] Yes Inject 1 each into the  skin in the morning, at noon, in the evening, and at bedtime. Cox, Kirsten, MD Taking Active Self  isosorbide mononitrate (IMDUR) 60 MG 24 hr tablet [id number] Yes Take 1 tablet (60 mg total) by mouth daily. Dana Salter, MD Taking Active   latanoprost (XALATAN) 0.005 % ophthalmic solution [id number] Yes Place 1 drop into both eyes at bedtime. [provider] Taking Active Self  LEVEMIR FLEXPEN 100 UNIT/ML FlexPen [id number] Yes Inject 30 Units into the skin daily. Dana Salter, MD Taking Active   linaclotide Karlene Einstein) 145 MCG CAPS capsule [id number] Yes Take 1 capsule (145 mcg total) by mouth daily before breakfast. Cox, Kirsten, MD Taking Active Self  LORazepam (ATIVAN) 0.5 MG tablet [id number] Yes TAKE ONE TABLET BY MOUTH ONCE DAILY AS NEEDED FOR ANXIETY  Patient taking differently: Take 0.5 mg by mouth every 6 (six) hours as needed for anxiety.   Cox, Kirsten, MD Taking Active Self  magnesium oxide (MAG-OX) 400 MG tablet [id number] Yes Take 1 tablet (400 mg total) by mouth daily. Take 2 (400 mg) daily=800 mg Dana Salter, MD Taking Active   nitroGLYCERIN (NITROSTAT) 0.4 MG SL tablet [id number] Yes Dissolve 1 tab under tongue as needed for chest pain. May repeat every 5 minutes x 2 doses. If no relief call 9-1-1. Please call [phone] to schedule an appointment for future refills. Thank you. Dana Hora, PA-C Taking Active Self  Omega-3 Fatty Acids (FISH OIL PO) [id number] Yes Take 1 capsule by mouth daily. [provider] Taking Active Self  OXYGEN [id number] Yes Inhale 2 L into the lungs at bedtime. [provider] Taking Active Self  pantoprazole (PROTONIX) 40 MG tablet [id number] Yes Take 1 tablet (40 mg total) by mouth daily. Cox, Kirsten, MD Taking Active Self  polyethylene glycol (MIRALAX / GLYCOLAX) 17 g packet [id number] Yes Take 17 g by mouth daily. Dana Salter, MD Taking Active   potassium chloride SA (KLOR-CON M) 20 MEQ tablet [id number] Yes Take 1 tablet (20 mEq total) by mouth 2 (two) times a week. Dana Salter, MD Taking Active   Probiotic CAPS [id number] Yes Take 1 capsule by mouth daily. [provider] Taking Active Self  promethazine (PHENERGAN) 25 MG tablet [id number] Yes Take 1 tablet (25 mg total) by mouth daily as needed.  Patient taking differently: Take 25 mg by mouth daily  as needed for nausea or vomiting.   Cox, Kirsten, MD Taking Active Self  ticagrelor (BRILINTA) 90 MG TABS tablet [id number] Yes Take 1 tablet (90 mg total) by mouth 2 (two) times daily. Dana Salter, MD Taking Active   torsemide (DEMADEX) 20 MG tablet [id number] Yes TAKE 4 TABLETS BY MOUTH ONCE DAILY  Patient taking differently: Take 80 mg by mouth daily.   Bensimhon, Bevelyn Buckles, MD Taking Active Self            Home Care and Equipment/Supplies: Were Home Health Services Ordered?: Yes Name of Home Health Agency:: The social worker was calling the Home Health Agency today to set up services, the patient will be going to Cardiac Rehab also- she will follow up with cardiac rehab by phone. Has Agency set up a time to come to your home?: No EMR reviewed for Home Health Orders: Orders present/patient has not received call (refer to CM for follow-up) (was discharged on 12-16-2022) Any new equipment or medical supplies ordered?: No  Functional Questionnaire: Do you need assistance with bathing/showering or dressing?: No Do you need assistance with meal  preparation?: No Do you need assistance with eating?: No Do you have difficulty maintaining continence: No Do you need assistance with getting out of bed/getting out of a chair/moving?: No Do you have difficulty managing or taking your medications?: No  Follow up appointments reviewed: PCP Follow-up appointment confirmed?: Yes Date of PCP follow-up appointment?: 12/17/22 Follow-up Provider: DR. Blane Ohara Specialist [facility] Follow-up appointment confirmed?: No Reason Specialist Follow-Up Not Confirmed: Patient has Specialist Provider Number and will Call for Appointment (Patient is calling this am to get a follow up appointment with the nephrologist) Do you need transportation to your follow-up appointment?: No Do you understand care options if your condition(s) worsen?: Yes-patient verbalized understanding    Alto Denver RN, MSN, CCM RN  Care Manager  Chronic Care Management Direct Number: [phone]

## 2022-12-18 ENCOUNTER — Encounter: Payer: Self-pay | Admitting: *Deleted

## 2022-12-18 ENCOUNTER — Telehealth: Payer: Self-pay | Admitting: *Deleted

## 2022-12-18 ENCOUNTER — Telehealth (HOSPITAL_COMMUNITY): Payer: Self-pay

## 2022-12-18 NOTE — Transitions of Care (Post Inpatient/ED Visit) (Signed)
   12/18/2022  Name: Dana [name] MRN: [id number] DOB: [birth year]  Today's TOC FU Call Status: Today's TOC FU Call Status:: Successful TOC FU Call Competed TOC FU Call Complete Date: 12/17/22 (verified CCM RN CM completed TOC call yesterdat 12/17/22- call deferred accordingly)  Attempted to reach the patient regarding the most recent Inpatient visit; connected with patient and discovered that [facility] call had already been placed yesterday by CCM RN CM  Follow Up Plan: No further outreach attempts will be made at this time; during review of EHR, confirmed after connecting with patient that [facility] call was successfully placed by CCM RN CM already established in patient's care- call deferred accordingly  Caryl Pina, RN, BSN, CCRN Alumnus RN CM Care Coordination/ Transition of Care- [facility] Care Management [phone]: direct office

## 2022-12-18 NOTE — Telephone Encounter (Signed)
Per Phase 1 Cardiac Rehab fax referral to [place]. 

## 2022-12-19 ENCOUNTER — Other Ambulatory Visit: Payer: PPO

## 2022-12-19 ENCOUNTER — Inpatient Hospital Stay: Payer: PPO

## 2022-12-19 DIAGNOSIS — E782 Mixed hyperlipidemia: Secondary | ICD-10-CM | POA: Diagnosis not present

## 2022-12-19 DIAGNOSIS — N184 Chronic kidney disease, stage 4 (severe): Secondary | ICD-10-CM | POA: Diagnosis not present

## 2022-12-19 NOTE — Assessment & Plan Note (Signed)
The current medical regimen is effective;  continue present plan and medications. Continue Buspar 5 mg three times a day and Lorazepam 0.5 mg once daily.

## 2022-12-19 NOTE — Assessment & Plan Note (Signed)
S/P stent.  Second stenosis of 80% will need to be stented in the future. Waited due to exposure to contrast during LHC due to CKD.

## 2022-12-19 NOTE — Assessment & Plan Note (Signed)
The current medical regimen is effective;  continue present plan and medications. Continue protonix 40 mg daily.  

## 2022-12-19 NOTE — Assessment & Plan Note (Signed)
Well controlled.  No changes to medicines. Continue  Repatha 140 mg every other week, Zetia 10 mg daily, Fish oil 1000 mg daily.  Continue to work on eating a healthy diet and exercise.  Labs drawn today.

## 2022-12-19 NOTE — Assessment & Plan Note (Signed)
Control: good. Follow up with endocrinoloyg.  Recommend check sugars fasting daily. Recommend check feet daily. Recommend annual eye exams. Medicines: Continue Levemir 30 units daily, Novolog 2-10 units on sliding scale before meals.  Continue to work on eating a healthy diet and exercise.

## 2022-12-19 NOTE — Assessment & Plan Note (Signed)
Management per specialist.  Continue Carvedilol 12.5 mg two times daily, Imdur 60 mg daily, Torsemide 80 mg daily, Potassium 20 meq once daily, Brilanta 90 mg TWICE DAILY. See's Dr. Patrica Duel [facility]. Weighs daily.

## 2022-12-19 NOTE — Assessment & Plan Note (Signed)
Management per specialist. 

## 2022-12-20 ENCOUNTER — Encounter: Payer: Self-pay | Admitting: Oncology

## 2022-12-20 DIAGNOSIS — I214 Non-ST elevation (NSTEMI) myocardial infarction: Secondary | ICD-10-CM | POA: Diagnosis not present

## 2022-12-20 DIAGNOSIS — Z7902 Long term (current) use of antithrombotics/antiplatelets: Secondary | ICD-10-CM | POA: Diagnosis not present

## 2022-12-20 LAB — CBC WITH DIFFERENTIAL/PLATELET
Basophils Absolute: 0 10*3/uL (ref 0.0–0.2)
Basos: 0 %
EOS (ABSOLUTE): 0.2 10*3/uL (ref 0.0–0.4)
Eos: 2 %
Hematocrit: 31.2 % — ABNORMAL LOW (ref 34.0–46.6)
Hemoglobin: 9.4 g/dL — ABNORMAL LOW (ref 11.1–15.9)
Immature Grans (Abs): 0.2 10*3/uL — ABNORMAL HIGH (ref 0.0–0.1)
Immature Granulocytes: 1 %
Lymphocytes Absolute: 1.9 10*3/uL (ref 0.7–3.1)
Lymphs: 13 %
MCH: 26.7 pg (ref 26.6–33.0)
MCHC: 30.1 g/dL — ABNORMAL LOW (ref 31.5–35.7)
MCV: 89 fL (ref 79–97)
Monocytes Absolute: 1 10*3/uL — ABNORMAL HIGH (ref 0.1–0.9)
Monocytes: 7 %
Neutrophils Absolute: 11 10*3/uL — ABNORMAL HIGH (ref 1.4–7.0)
Neutrophils: 77 %
Platelets: 328 10*3/uL (ref 150–450)
RBC: 3.52 x10E6/uL — ABNORMAL LOW (ref 3.77–5.28)
RDW: 15.2 % (ref 11.7–15.4)
WBC: 14.3 10*3/uL — ABNORMAL HIGH (ref 3.4–10.8)

## 2022-12-20 LAB — COMPREHENSIVE METABOLIC PANEL
ALT: 9 IU/L (ref 0–32)
AST: 12 IU/L (ref 0–40)
Albumin/Globulin Ratio: 1.2 (ref 1.2–2.2)
Albumin: 3.8 g/dL (ref 3.8–4.9)
Alkaline Phosphatase: 127 IU/L — ABNORMAL HIGH (ref 44–121)
BUN/Creatinine Ratio: 28 — ABNORMAL HIGH (ref 9–23)
BUN: 70 mg/dL — ABNORMAL HIGH (ref 6–24)
Bilirubin Total: 0.4 mg/dL (ref 0.0–1.2)
CO2: 22 mmol/L (ref 20–29)
Calcium: 9.4 mg/dL (ref 8.7–10.2)
Chloride: 96 mmol/L (ref 96–106)
Creatinine, Ser: 2.46 mg/dL — ABNORMAL HIGH (ref 0.57–1.00)
Globulin, Total: 3.3 g/dL (ref 1.5–4.5)
Glucose: 286 mg/dL — ABNORMAL HIGH (ref 70–99)
Potassium: 4.7 mmol/L (ref 3.5–5.2)
Sodium: 138 mmol/L (ref 134–144)
Total Protein: 7.1 g/dL (ref 6.0–8.5)
eGFR: 22 mL/min/{1.73_m2} — ABNORMAL LOW (ref >59)

## 2022-12-20 LAB — LIPID PANEL
Chol/HDL Ratio: 3.1 ratio (ref 0.0–4.4)
Cholesterol, Total: 128 mg/dL (ref 100–199)
HDL: 41 mg/dL (ref >39)
LDL Chol Calc (NIH): 66 mg/dL (ref 0–99)
Triglycerides: 115 mg/dL (ref 0–149)
VLDL Cholesterol Cal: 21 mg/dL (ref 5–40)

## 2022-12-20 LAB — LITHOLINK CKD PROGRAM

## 2022-12-20 NOTE — Addendum Note (Signed)
Addended by: Domenic Schwab on: 12/20/2022 04:51 PM   Modules accepted: Orders

## 2022-12-21 ENCOUNTER — Inpatient Hospital Stay: Payer: PPO

## 2022-12-21 ENCOUNTER — Other Ambulatory Visit: Payer: Self-pay | Admitting: Pharmacist

## 2022-12-21 ENCOUNTER — Telehealth: Payer: Self-pay | Admitting: Hematology and Oncology

## 2022-12-21 NOTE — Telephone Encounter (Signed)
Patient was recently admitted with non-STEMI. Notified her that Dr. Shyrl Numbers recommends holding Retacrit for now as it can increase her risk. She will keep her f/u in July. I cancelled her appointments until then. HgB 9.4 this week. She knows to call us if she feels like her HgB is lower.

## 2022-12-24 NOTE — Progress Notes (Signed)
Advanced Heart Failure Clinic Note   PCP: CoxElnita Maxwell, MD HF Cardiologist: [name], MD   Reason for Visit: f/u chronic diastolic HF  HPI: Dana [name] is 62 y.o. female with history of diastolic heart failure s/p cardiomems 02/2018, CAD, s/p DES RCA and LAD 2018, DMI, htn, hyperlipidemia, PAD, and OSA on CPAP, PAF s/p ablation, PAD s/p L great toe and 4th toe amputation, stage IIIb CKD.   Admitted 03/22 with NSTEMI and a/c CHF. LHC 03/22 showed moderate mid-LAD and mid-RCA disease, severe mid-L-Cx stenosis s/p PCI/DES mid Cx and side branch angioplasty of OM1.   Unfortunately she developed a contrast-induced nephropathy after cath and creatinine increased from 2.2 to a peak of 5.5. Additionally, she developed severe nausea and vomiting, gastroparesis and required IV antiplatelet therapy.  During this time she developed epistaxis and acute blood loss anemia requiring transfusion.  She was switched back to oral DAPT. Diuretics were reintroduced and her creatinine began to improve over the following days. Her leukocytosis persisted but all her infectious workup was negative and this was felt to be reactive.    Per chart review, she was admitted 6/22 to [facility] for DKA, started on insulin gtt, IVF and diuretics held. Developed pulmonary vascular congestion vs PNA and was started on IV abx, and diuretics were resumed. She suffered a PEA arrest 01/12/21 with ROSC after 10-15 minutes of CPR and was transferred to [place]; precipitating factor for PEA arrest thought to be respiratory driven. On arrival, she was still in DKA, AKI on CKD with oliguria and Nephrology was consulted for further management. TTE with EF 60%, min MR/TR. Concern for stroke due to left-sided weakness and MRI confirmed anoxic brain injury, but no definite infarct. She was extubated, SCr returned to baseline of 1.6-1.8 and she was discharged to rehab facility.   Zio placed 8/22 for palpitations, showing mostly SB/SR no afib, 66  SVT runs, frequent isolated SVE (11.2%).  Admitted from AHF clinic 10/22 for A/C CHF after failing outpatient escalation in diuretic therapy. She was enrolled in FASTR trial and diuresed > 15 lb.  Hospitalization complicated by AKI on CKD, Scr up to 3.13. RHC showed mild to moderate elevating filling pressures with large v-waves in PCWP tracing suggesting severe MR vs severe diastolic dysfunction. TEE 55-60% showed severe central MR.   Patient underwent TEER of mitral valve with placement of 1XT W and 1NT W clip on 05/18/2021. NT W clip was entangled and had to be deployed into lateral part of valve. No additional clip placement could be preformed. Echo 11/04 with EF 50-55% and residual moderate MR. Given 1 dose IV lasix prior to discharge.  Echo 3/23 showed EF 55-60%, RV OK, moderate MR mean gradient 7-8 mmHg. Saw [name] and MR now moderate, no indication for 3rd clip.   Echo 07/18/22 reviewed by [facility] 07/18/22. LVEF 55-60%, normal RV and moderate MR, mG 7.5 mmHg, essentially unchanged from prior echo.   Follow up 2/24, NYHA II-III and volume mildly up. Given Furoscix for PRN use.    Admitted 2/24 with CP. LHC showed 2 vessel obstructive CAD, with new 90% stenosis pLAD with RFR 0.48. The stent in the LCx is occluded after a large first OM; Patent stent in the distal RCA; Mildly elevated LVEDP 21 mm Hg; Successful PCI of the proximal LAD with DES x 1 overlapping with prior stent. Lcx occlusion to be treated medically. Elevated LVEDP on cath and given IV lasix and discharged home, weight 231 lbs.  Today she returns for post hospital HF follow up with her husband and daughter. Overall feeling fine. She remains SOB with activity and ADLs. Swelling in hands and ankles. Had dizziness on hydral 50, now on 25. Denies palpitations, CP, abnormal bleeding or PND/Orthopnea. Appetite ok. No fever or chills. Weight at home 233 pounds. Taking all medications. Not using metolazone.    Cardiac  Studies: - LHC (2/24): 2v obstructive CAD. New 90% stenosis pLAD with RFR 0.48. The stent in the LCx is occluded after a large first OM. Patent stent in the distal RCA. S/p PCI of pLAD with DES x 1 overlapping with prior stent.  - Echo (1/24): EF 55-60%, normal RV and moderate MR, mG 7.5 mmHg, essentially unchanged from prior echo.   - Echo (3/23): EF 55-60%, RV normal, moderate MR  - Echo 11/22: EF 50-55%, residual moderate MR  - RHC (10/22):   RA = 8 RV = 59/9 PA = 61/17 (35) PCW = mean 23 (v = 49) -> performed multiple wedges Fick cardiac output/index = PVR = 1.6 WU FA sat = 98% PA sat = 69%, 70% PaPi = 5.5 Assessment: 1. Mild to moderately elevated filling pressures with very large v-waves in PCWP tracing suggestive of severe diastolic dysfunction vs severe MR  - TEE (10/22): EF 55-60%, basal inferior akinesis, severe central MR. - Echo (10/22): EF 65-70%, RV ok, moderate MR - Echo (5/21): EF 60-65%, RV ok, Grade II DD. - Echo (2/22): EF 55-60%, RV ok, Grade II DD, mod MR - Echo (3/22): EF 55-60%, Grade II DD, normal RV - Echo (6/22): EF 60%, mod MR/TR (per chart review at [facility]). - Zio (8/19): no arrhythmias, occasional PAC/PVCs - Zio (9/22): mostly SB/SR no afib, 66 SVT runs, frequent isolated SVE (11.2%).  - LHC (3/22): 3 vessel CAD with moderate mid-LAD and mid-RCA stenoses, and severe mid-LCx stenosis; successful PCI of the mid-circumflex, reducing severe 90% stenosis to 0% with a 2.75x22 mm Resolute Onyx DES, provisional side branch angioplasty of the OM1 with a 2.5 mm balloon through the stent struts  Review of systems complete and found to be negative unless listed in HPI.    Past Medical History:  Diagnosis Date   Acquired absence of left great toe (HCC)    Acquired absence of other left toe(s) (HCC)    ACS (acute coronary syndrome) (HCC)    Anemia    Anoxic brain injury (HCC)    Anxiety    CAD (coronary artery disease)    DES mLAD 04/07/15; DES dRCA & DES  pPDA 08/30/16; DES mCX & PTCA OM1 09/29/20   Chronic diastolic (congestive) heart failure (HCC)    Chronic pain    Chronic systolic (congestive) heart failure (HCC)    CKD (chronic kidney disease)    Contrast dye induced nephropathy, possible 09/03/2020   COPD (chronic obstructive pulmonary disease) (HCC)    Diabetes (HCC)type 1    Diastolic CHF (HCC)    Dyspnea    Family history of breast cancer 10/23/2021   Gastro-esophageal reflux disease without esophagitis    Hyperlipidemia    Hypertension    Hypertensive heart disease with heart failure (HCC)    Hypoxemia    Idiopathic gout, multiple sites    Leukocytosis 03/29/2022   Localized edema    Low back pain    Mitral regurgitation    Mitral regurgitation    Mixed hyperlipidemia    Mixed incontinence    Morbid (severe) obesity due to excess calories (HCC)  Neuromuscular disorder (HCC)    Neuropathy    Non-ST elevation (NSTEMI) myocardial infarction (HCC)    08/29/20, 09/29/20   OSA (obstructive sleep apnea) 09/03/2020   Other specified hypothyroidism    PAF (paroxysmal atrial fibrillation) (HCC)    s/p pulmonary vein isolation by cryoablation 10/04/15 Dr. Rudolpho Sevin in [name]   PEA (Pulseless electrical activity) [facility])    ~ 01/13/21 at [facility] during admission DKA, volume overload, possible CAP, hypoxia s/p ACLS with ROSC ~ 10-15   Pneumonia    Primary insomnia    Rheumatoid arthritis (HCC)    Stroke (HCC)    Thyroid disease    Type 1 diabetes mellitus (HCC)    Vitamin D deficiency     Current Outpatient Medications  Medication Sig Dispense Refill   allopurinol (ZYLOPRIM) 300 MG tablet Take 1 tablet (300 mg total) by mouth daily. 90 tablet 1   aspirin EC 81 MG tablet Take 1 tablet (81 mg total) by mouth daily. Swallow whole. 30 tablet 12   busPIRone (BUSPAR) 10 MG tablet Take 1 tablet (10 mg total) by mouth 3 (three) times daily. 90 tablet 2   carvedilol (COREG) 12.5 MG tablet TAKE ONE TABLET BY MOUTH TWICE DAILY  (Patient taking differently: Take 12.5 mg by mouth 2 (two) times daily with a meal.) 180 tablet 3   cephALEXin (KEFLEX) 250 MG capsule Take 250 mg by mouth at bedtime.     Continuous Blood Gluc Sensor (FREESTYLE LIBRE 2 SENSOR) MISC USE TO check blood glucose AS DIRECTED AND CHANGE sensor every 14 DAYS     CRANBERRY PO Take 2 tablets by mouth daily.     EPINEPHrine 0.3 mg/0.3 mL IJ SOAJ injection Use as directed for life-threatening allergic reaction. 4 Device 3   estradiol (ESTRACE) 0.1 MG/GM vaginal cream Place 1 Applicatorful vaginally every other day.     Evolocumab (REPATHA SURECLICK) 140 MG/ML SOAJ Inject 1 Dose into the skin every 14 (fourteen) days. 6 mL 1   ezetimibe (ZETIA) 10 MG tablet Take 1 tablet (10 mg total) by mouth daily. 90 tablet 1   HYDROcodone-acetaminophen (NORCO) 7.5-325 MG tablet TAKE ONE TABLET BY MOUTH THREE TIMES DAILY AS NEEDED (Patient taking differently: Take 1 tablet by mouth 3 (three) times daily as needed for moderate pain.) 90 tablet 0   insulin aspart (NOVOLOG FLEXPEN) 100 UNIT/ML FlexPen 2-10 U before meals based on SSI. 15 mL 3   Insulin Pen Needle (BD PEN NEEDLE NANO 2ND GEN) 32G X 4 MM MISC Inject 1 each into the skin in the morning, at noon, in the evening, and at bedtime. 600 each 3   isosorbide mononitrate (IMDUR) 60 MG 24 hr tablet Take 1 tablet (60 mg total) by mouth daily. 30 tablet 0   latanoprost (XALATAN) 0.005 % ophthalmic solution Place 1 drop into both eyes at bedtime.     LEVEMIR FLEXPEN 100 UNIT/ML FlexPen Inject 30 Units into the skin daily.     linaclotide (LINZESS) 145 MCG CAPS capsule Take 1 capsule (145 mcg total) by mouth daily before breakfast. 90 capsule 1   LORazepam (ATIVAN) 0.5 MG tablet TAKE ONE TABLET BY MOUTH ONCE DAILY AS NEEDED FOR ANXIETY (Patient taking differently: Take 0.5 mg by mouth every 6 (six) hours as needed for anxiety.) 30 tablet 2   magnesium oxide (MAG-OX) 400 MG tablet Take 1 tablet (400 mg total) by mouth daily.  Take 2 (400 mg) daily=800 mg 60 tablet 1   nitroGLYCERIN (NITROSTAT) 0.4 MG  SL tablet Dissolve 1 tab under tongue as needed for chest pain. May repeat every 5 minutes x 2 doses. If no relief call 9-1-1. Please call [phone] to schedule an appointment for future refills. Thank you. 50 tablet 0   Omega-3 Fatty Acids (FISH OIL PO) Take 1 capsule by mouth daily.     OXYGEN Inhale 2 L into the lungs at bedtime.     pantoprazole (PROTONIX) 40 MG tablet Take 1 tablet (40 mg total) by mouth daily. 90 tablet 1   polyethylene glycol (MIRALAX / GLYCOLAX) 17 g packet Take 17 g by mouth daily. 14 each 0   potassium chloride SA (KLOR-CON M) 20 MEQ tablet Take 1 tablet (20 mEq total) by mouth 2 (two) times a week. 90 tablet 1   Probiotic CAPS Take 1 capsule by mouth daily.     promethazine (PHENERGAN) 25 MG tablet Take 1 tablet (25 mg total) by mouth daily as needed. (Patient taking differently: Take 25 mg by mouth daily as needed for nausea or vomiting.) 30 tablet 3   ticagrelor (BRILINTA) 90 MG TABS tablet Take 1 tablet (90 mg total) by mouth 2 (two) times daily. 60 tablet 0   torsemide (DEMADEX) 20 MG tablet TAKE 4 TABLETS BY MOUTH ONCE DAILY (Patient taking differently: Take 80 mg by mouth daily.) 336 tablet 3   No current facility-administered medications for this visit.   Allergies  Allergen Reactions   Naproxen Hives and Rash   Shellfish-Derived Products Hives, Swelling and Other (See Comments)    Angioedema    Strawberry (Diagnostic) Hives   Celecoxib Other (See Comments)    Stomach pain   Glucosamine Hives, Swelling and Other (See Comments)    Angioedema    Iron     IV iron transfusion - hives - Feb 2022 hospitalization   Metoclopramide Other (See Comments)    Facial twitching and stuttering   Metolazone Other (See Comments)    Acute renal failure   Statins Other (See Comments)    Muscle pain   Social History   Socioeconomic History   Marital status: Married    Spouse name: [name]    Number of children: 2   Years of education: 16   Highest education level: Master's degree (e.g., MA, Dana, MEng, MEd, MSW, MBA)  Occupational History   Occupation: on disability/retired early former Engineer, site  Tobacco Use   Smoking status: Former    Types: Cigarettes    Start date: 1983    Quit date: 1984    Years since quitting: 40.4    Passive exposure: Past   Smokeless tobacco: Never  Vaping Use   Vaping Use: Never used  Substance and Sexual Activity   Alcohol use: Never   Drug use: Never   Sexual activity: Not on file  Other Topics Concern   Not on file  Social History Narrative   Not on file   Social Determinants of Health   Financial Resource Strain: Medium Risk (10/30/2022)   Overall Financial Resource Strain (CARDIA)    Difficulty of Paying Living Expenses: Somewhat hard  Food Insecurity: No Food Insecurity (12/06/2022)   Hunger Vital Sign    Worried About Running Out of Food in the Last Year: Never true    Ran Out of Food in the Last Year: Never true  Transportation Needs: No Transportation Needs (12/06/2022)   PRAPARE - Administrator, Civil Service (Medical): No    Lack of Transportation (Non-Medical): No  Physical Activity:  Inactive (12/17/2022)   Exercise Vital Sign    Days of Exercise per Week: 0 days    Minutes of Exercise per Session: 0 min  Stress: Stress Concern Present (12/17/2022)   [employer] of Occupational Health - Occupational Stress Questionnaire    Feeling of Stress : To some extent  Social Connections: Moderately Integrated (07/25/2022)   Social Connection and Isolation Panel [NHANES]    Frequency of Communication with Friends and Family: More than three times a week    Frequency of Social Gatherings with Friends and Family: More than three times a week    Attends Religious Services: More than 4 times per year    Active Member of [employer] or Organizations: No    Attends Banker Meetings: Never    Marital Status:  Married  Catering manager Violence: Not At Risk (12/06/2022)   Humiliation, Afraid, Rape, and Kick questionnaire    Fear of Current or Ex-Partner: No    Emotionally Abused: No    Physically Abused: No    Sexually Abused: No   Family History  Problem Relation Age of Onset   Breast cancer Mother 83       contralateral breast ca dx 50   Coronary artery disease Mother    Hypertension Mother    Hyperlipidemia Mother    Diabetes type II Mother    Lung cancer Mother 95   Diabetes Father    Hypertension Father    Mental illness Sister    Bipolar disorder Other    Depression Other    Schizophrenia Other    Cancer Other        MGF's sisters x2; unknown type   There were no vitals taken for this visit.  Wt Readings from Last 3 Encounters:  12/17/22 103.9 kg (229 lb)  12/16/22 102 kg (224 lb 14.4 oz)  11/15/22 107.4 kg (236 lb 12 oz)   PHYSICAL EXAM: General:  NAD. No resp difficulty, arrived in [facility] HEENT: Normal Neck: Supple. JVP 7-8, thick neck. Carotids 2+ bilat; no bruits. No lymphadenopathy or thryomegaly appreciated. Cor: PMI nondisplaced. Regular rate & rhythm. No rubs, gallops, 2/6 MR Lungs: Clear Abdomen: Soft, obese, nontender, nondistended. No hepatosplenomegaly. No bruits or masses. Good bowel sounds. Extremities: No cyanosis, clubbing, rash, edema Neuro: Alert & oriented x 3, cranial nerves grossly intact. Moves all 4 extremities w/o difficulty. Affect pleasant.  CardioMEMs: 12, goal 10 (Personally reviewed)  ECG (personally reviewed): NSR 70 bpm, QTc 481 msec  ASSESSMENT & PLAN: 1.  Chronic Diastolic Heart Failure - Echo 07/6107 EF 55-60%  - Echo (3/22): EF 55-60%, Grade II DD, normal RV - Echo (6/22): EF 60%, mod MR/TR (per chart review at [facility]). - Admit 10/22 with a/c CHF, enrolled in FASTR trial and diuresed 15 lbs. Severe MR likely contributing to decompensation.  - RHC (10/22): mild to moderate elevated filling pressures, large v-waves PCWP tracing - TEE  (10/22): EF 55-60%, basal inferior akinesis, severe central MR.>>Underwent TEER>>mod residual MR - Echo (11/22): EF 50-55%, severe LV HK, moderate MR - Echo (3/23): EF 55-60%, RV OK, moderate MR mean gradient 7-8 mmHg - Echo (1/24): EF 55-60%, RV normal, mod MR mG 7.5 mmHg  - Stable NYHA III. Volume status slightly elevated on CardioMEMs (dPAP 12, goal 10) and by weight. - She has Furoscix to use PRN to help deal with fluid overload and try to minimize metolazone use and related episodes of AKI. Instructed to use Furoscix x 1 + 40 KCL tomorrow (  Sat) and hold oral torsemide. Goal home weight 228 lbs.  - Continue torsemide 80 mg daily + 20 KCL daily (resume on Sunday)  - Continue hydralazine 25 mg tid + Imdur 30 mg daily. - Continue carvedilol 12.5 mg bid. - No SGLT2i with T1DM. - Continue TED hose. - Referred to CR at [facility] - Labs from 09/17/22 reviewed, K 4.2, SCr 1.89   2. CAD - Known CAD with prior stents to mid LAD and pRCA.  - NSTEMI (2/22). Cath deferred due to AKI. Treated medically.  - NSTEMI (7/22)-->S/p PCI/DES to mid Cx, balloon angioplasty to OM1  - LHC (2/24) for chest pain--> new 90% stenosis pLAD. Successful PCI of pLAD with DES x 1 overlapping with prior stent. - Plan DAPT indefinitely with ASA + Plavix - No chest pain - Lipids under good control w/ Repatha + Zetia Followed by Dr. Rennis Golden.  - She's been referred to CR at [facility].   3. CKD IIIb - Prior h/o CIN following LHC - Previous SCr baseline 1.6-1.8 with multiple AKIs over the last year. New baseline ~ 2.0-2.5 - Followed by Nephrology in [facility] - Furoscix to use PRN and limit metolazone use - Labs from 09/17/22 reviewed, K 4.2, SCr 1.89  4. Mitral regurgitation - Severe central MR on TEE on 10/22.   - S/p TEER 05/18/2021 - two clips placed, one became entangled and had to be deployed. - Moderate residual MR on echo 11/22. - Recent echo 1/24 with stable moderate MR, mG 7.5 mmHg  - Structural team following. No  indication for re-clip   5. OSA - Continue CPAP qhs.    6. DM1 - Hgb A1c 7.4 (2/24) - No SGLT2i.   7. Chronic Anemia - Baseline ~10  felt to be 2/2 renal disease.  - Hx severe epistaxis and ABLA requiring transfusion. Hx rash with IV iron. - Following with Heme/Onc. Receiving Retacrit monthly.  8. Palpitations - Hx PAF s/p ablation at least 10 years ago. No documented recurrence. Not currently on anticoagulation. Would need to consider risk/benefits of anticoagulation if recurs especially with history of anemia requiring transfusion - Zio XT 14 day (8/22) showed mostly SB/SR no afib, 66 SVT runs, frequent isolated SVE (11.2%). - Symptomatically much improved - Continue carvedilol 12.5 mg bid.  Follow up in 3 months, as scheduled.  Jacklynn Ganong, FNP  9:56 AM   Dana [name] prescribed  Patient viewed patient education video with QR code for Lenor Coffin code for FUROSCIX placed on AVS  Call FUROSCIX Direct at [phone] for questions regarding on body infuser.  Day 1  FUROSCIX 80 mg once daily  via on body infuser + KDUR 40  Hold torsemide while using Furoscix

## 2022-12-25 ENCOUNTER — Ambulatory Visit (HOSPITAL_COMMUNITY)
Admission: RE | Admit: 2022-12-25 | Discharge: 2022-12-25 | Disposition: A | Discharge status: Home / Self Care | Payer: PPO | Source: Ambulatory Visit | Attending: Family Medicine | Admitting: Family Medicine

## 2022-12-25 ENCOUNTER — Encounter (HOSPITAL_COMMUNITY): Payer: Self-pay

## 2022-12-25 ENCOUNTER — Inpatient Hospital Stay: Payer: PPO

## 2022-12-25 VITALS — BP 122/64 | HR 69 | Ht 64.5 in | Wt 229.0 lb

## 2022-12-25 DIAGNOSIS — N184 Chronic kidney disease, stage 4 (severe): Secondary | ICD-10-CM | POA: Diagnosis not present

## 2022-12-25 DIAGNOSIS — D631 Anemia in chronic kidney disease: Secondary | ICD-10-CM

## 2022-12-25 DIAGNOSIS — Z95818 Presence of other cardiac implants and grafts: Secondary | ICD-10-CM | POA: Diagnosis not present

## 2022-12-25 DIAGNOSIS — I252 Old myocardial infarction: Secondary | ICD-10-CM | POA: Diagnosis not present

## 2022-12-25 DIAGNOSIS — N1832 Chronic kidney disease, stage 3b: Secondary | ICD-10-CM | POA: Insufficient documentation

## 2022-12-25 DIAGNOSIS — Z9889 Other specified postprocedural states: Secondary | ICD-10-CM

## 2022-12-25 DIAGNOSIS — G4733 Obstructive sleep apnea (adult) (pediatric): Secondary | ICD-10-CM | POA: Diagnosis not present

## 2022-12-25 DIAGNOSIS — Z7982 Long term (current) use of aspirin: Secondary | ICD-10-CM | POA: Insufficient documentation

## 2022-12-25 DIAGNOSIS — I130 Hypertensive heart and chronic kidney disease with heart failure and stage 1 through stage 4 chronic kidney disease, or unspecified chronic kidney disease: Secondary | ICD-10-CM | POA: Diagnosis not present

## 2022-12-25 DIAGNOSIS — Z7902 Long term (current) use of antithrombotics/antiplatelets: Secondary | ICD-10-CM | POA: Diagnosis not present

## 2022-12-25 DIAGNOSIS — Z79899 Other long term (current) drug therapy: Secondary | ICD-10-CM | POA: Insufficient documentation

## 2022-12-25 DIAGNOSIS — I340 Nonrheumatic mitral (valve) insufficiency: Secondary | ICD-10-CM | POA: Insufficient documentation

## 2022-12-25 DIAGNOSIS — I5032 Chronic diastolic (congestive) heart failure: Secondary | ICD-10-CM | POA: Insufficient documentation

## 2022-12-25 DIAGNOSIS — E1022 Type 1 diabetes mellitus with diabetic chronic kidney disease: Secondary | ICD-10-CM | POA: Insufficient documentation

## 2022-12-25 DIAGNOSIS — Z955 Presence of coronary angioplasty implant and graft: Secondary | ICD-10-CM | POA: Insufficient documentation

## 2022-12-25 DIAGNOSIS — I2510 Atherosclerotic heart disease of native coronary artery without angina pectoris: Secondary | ICD-10-CM | POA: Diagnosis not present

## 2022-12-25 DIAGNOSIS — D649 Anemia, unspecified: Secondary | ICD-10-CM | POA: Insufficient documentation

## 2022-12-25 MED ORDER — TICAGRELOR 90 MG PO TABS: 90.0000 mg | ORAL_TABLET | Freq: Two times a day (BID) | ORAL | 6 refills | Status: DC

## 2022-12-25 MED ORDER — ISOSORBIDE MONONITRATE ER 60 MG PO TB24: 60.0000 mg | ORAL_TABLET | Freq: Every day | ORAL | 6 refills | Status: DC

## 2022-12-25 NOTE — Patient Instructions (Signed)
Medication Changes:  Can take extra Torsemide 20 mg for 2 days  Lab Work:  none  Testing/Procedures:  none  Referrals:  none  Special Instructions // Education:  Do the following things EVERYDAY: Weigh yourself in the morning before breakfast. Write it down and keep it in a log. Take your medicines as prescribed Eat low salt foods--Limit salt (sodium) to 2000 mg per day.  Stay as active as you can everyday Limit all fluids for the day to less than 2 liters   Follow-Up in: 3 months with Dr Gala Romney  At the [facility], you and your health needs are our priority. We have a designated team specialized in the treatment of Heart Failure. This Care Team includes your primary Heart Failure Specialized Cardiologist (physician), Advanced Practice Providers (APPs- Physician Assistants and Nurse Practitioners), and Pharmacist who all work together to provide you with the care you need, when you need it.   You may see any of the following providers on your designated Care Team at your next follow up:  Dr. Arvilla Meres Dr. Marca Ancona Dr. Marcos Eke, NP Robbie Lis, [place] [facility] [place], [place] Brynda Peon, NP Karle Plumber, PharmD   Please be sure to bring in all your medications bottles to every appointment.   Need to Contact us:  If you have any questions or concerns before your next appointment please send [name] a message through [place] or call our office at [phone].    TO LEAVE A MESSAGE FOR THE NURSE SELECT OPTION 2, PLEASE LEAVE A MESSAGE INCLUDING: YOUR NAME DATE OF BIRTH CALL BACK NUMBER REASON FOR CALL**this is important as we prioritize the call backs  YOU WILL RECEIVE A CALL BACK THE SAME DAY AS LONG AS YOU CALL BEFORE 4:00 PM

## 2022-12-26 ENCOUNTER — Encounter (HOSPITAL_COMMUNITY): Payer: Self-pay | Admitting: Internal Medicine

## 2022-12-26 DIAGNOSIS — F411 Generalized anxiety disorder: Secondary | ICD-10-CM | POA: Diagnosis not present

## 2022-12-27 ENCOUNTER — Inpatient Hospital Stay: Payer: PPO

## 2022-12-28 ENCOUNTER — Inpatient Hospital Stay: Payer: PPO

## 2022-12-28 ENCOUNTER — Other Ambulatory Visit: Payer: Self-pay | Admitting: Internal Medicine

## 2022-12-28 DIAGNOSIS — E782 Mixed hyperlipidemia: Secondary | ICD-10-CM

## 2022-12-28 DIAGNOSIS — I2510 Atherosclerotic heart disease of native coronary artery without angina pectoris: Secondary | ICD-10-CM

## 2023-01-01 ENCOUNTER — Other Ambulatory Visit: Payer: Self-pay | Admitting: Family Medicine

## 2023-01-01 DIAGNOSIS — G4733 Obstructive sleep apnea (adult) (pediatric): Secondary | ICD-10-CM | POA: Diagnosis not present

## 2023-01-01 DIAGNOSIS — I129 Hypertensive chronic kidney disease with stage 1 through stage 4 chronic kidney disease, or unspecified chronic kidney disease: Secondary | ICD-10-CM | POA: Diagnosis not present

## 2023-01-01 DIAGNOSIS — N1832 Chronic kidney disease, stage 3b: Secondary | ICD-10-CM | POA: Diagnosis not present

## 2023-01-01 DIAGNOSIS — I2510 Atherosclerotic heart disease of native coronary artery without angina pectoris: Secondary | ICD-10-CM | POA: Diagnosis not present

## 2023-01-01 DIAGNOSIS — D631 Anemia in chronic kidney disease: Secondary | ICD-10-CM | POA: Diagnosis not present

## 2023-01-02 LAB — LAB REPORT - SCANNED: EGFR: 32

## 2023-01-03 ENCOUNTER — Other Ambulatory Visit (HOSPITAL_COMMUNITY): Payer: Self-pay | Admitting: Internal Medicine

## 2023-01-03 ENCOUNTER — Other Ambulatory Visit: Payer: Self-pay | Admitting: Family Medicine

## 2023-01-03 ENCOUNTER — Other Ambulatory Visit (HOSPITAL_COMMUNITY): Payer: Self-pay | Admitting: Family Medicine

## 2023-01-03 DIAGNOSIS — K5904 Chronic idiopathic constipation: Secondary | ICD-10-CM

## 2023-01-09 ENCOUNTER — Other Ambulatory Visit: Payer: Self-pay | Admitting: Family Medicine

## 2023-01-10 ENCOUNTER — Inpatient Hospital Stay: Payer: PPO

## 2023-01-11 ENCOUNTER — Other Ambulatory Visit: Payer: Self-pay | Admitting: Family Medicine

## 2023-01-11 ENCOUNTER — Inpatient Hospital Stay: Payer: PPO

## 2023-01-11 DIAGNOSIS — E1042 Type 1 diabetes mellitus with diabetic polyneuropathy: Secondary | ICD-10-CM | POA: Diagnosis not present

## 2023-01-11 DIAGNOSIS — I10 Essential (primary) hypertension: Secondary | ICD-10-CM | POA: Diagnosis not present

## 2023-01-11 DIAGNOSIS — E782 Mixed hyperlipidemia: Secondary | ICD-10-CM | POA: Diagnosis not present

## 2023-01-11 DIAGNOSIS — Z794 Long term (current) use of insulin: Secondary | ICD-10-CM | POA: Diagnosis not present

## 2023-01-13 DIAGNOSIS — I2510 Atherosclerotic heart disease of native coronary artery without angina pectoris: Secondary | ICD-10-CM

## 2023-01-13 DIAGNOSIS — F330 Major depressive disorder, recurrent, mild: Secondary | ICD-10-CM

## 2023-01-13 DIAGNOSIS — I130 Hypertensive heart and chronic kidney disease with heart failure and stage 1 through stage 4 chronic kidney disease, or unspecified chronic kidney disease: Secondary | ICD-10-CM

## 2023-01-13 DIAGNOSIS — I200 Unstable angina: Secondary | ICD-10-CM

## 2023-01-15 DIAGNOSIS — I4891 Unspecified atrial fibrillation: Secondary | ICD-10-CM | POA: Diagnosis not present

## 2023-01-15 DIAGNOSIS — Z9861 Coronary angioplasty status: Secondary | ICD-10-CM | POA: Diagnosis not present

## 2023-01-15 DIAGNOSIS — I25118 Atherosclerotic heart disease of native coronary artery with other forms of angina pectoris: Secondary | ICD-10-CM | POA: Diagnosis not present

## 2023-01-15 DIAGNOSIS — I252 Old myocardial infarction: Secondary | ICD-10-CM | POA: Diagnosis not present

## 2023-01-15 DIAGNOSIS — F411 Generalized anxiety disorder: Secondary | ICD-10-CM | POA: Diagnosis not present

## 2023-01-16 DIAGNOSIS — I509 Heart failure, unspecified: Secondary | ICD-10-CM | POA: Diagnosis not present

## 2023-01-16 DIAGNOSIS — I252 Old myocardial infarction: Secondary | ICD-10-CM | POA: Diagnosis not present

## 2023-01-16 DIAGNOSIS — I110 Hypertensive heart disease with heart failure: Secondary | ICD-10-CM | POA: Diagnosis not present

## 2023-01-16 DIAGNOSIS — Z515 Encounter for palliative care: Secondary | ICD-10-CM | POA: Diagnosis not present

## 2023-01-20 ENCOUNTER — Other Ambulatory Visit (HOSPITAL_COMMUNITY): Payer: Self-pay | Admitting: Family Medicine

## 2023-01-21 ENCOUNTER — Encounter: Payer: PPO | Admitting: Licensed Clinical Social Worker

## 2023-01-21 ENCOUNTER — Encounter (HOSPITAL_COMMUNITY): Payer: Self-pay | Admitting: Internal Medicine

## 2023-01-22 ENCOUNTER — Ambulatory Visit: Payer: Self-pay | Admitting: Licensed Clinical Social Worker

## 2023-01-22 DIAGNOSIS — F411 Generalized anxiety disorder: Secondary | ICD-10-CM | POA: Diagnosis not present

## 2023-01-22 NOTE — Patient Outreach (Signed)
  Care Coordination   Follow Up Visit Note   01/22/2023 Name: Dana [name] MRN: [id number] DOB: 15-Jun-1961  Dana [name] is a 62 y.o. year old female who sees Cox, Kirsten, MD for primary care. I spoke with  [name] by phone today.  What matters to the patients health and wellness today?   Patient Stated she has a blockage in her heart. Medical providers are monitoring heart blockage .She participates in cardiac rehab program       Goals Addressed             This Visit's Progress    Patient Stated she has a blockage in her heart. Medical providers are monitoring heart blockage .She participates in cardiac rehab program       Interventions: Spoke with client about client needs Discussed program support with RN, LCSW, Pharmacist Provided counseling support to Riko Lumsden Talked with Riana about self care and self care activities. Reviewed medication procurement. She had prescribed medications Reviewed support of spouse. Her spouse is very supportive. He obtains groceries for couple. He helps client as needed Discussed mobility of client. She lives in two level home. There are 13 steps from one level of home to another Discussed client involvement in cardiac rehab program. She said this program is scheduled to last about 3 months. Discussed energy level of client. Discussed mood of client. Encouraged client to practice relaxation methods to help her cope with stress issues Gave client LCSW phone number of [phone] and encouraged her to call LCSW as needed for SW support She sees endocrinologist for support. She sees cardiologist for support. She sees PCP, Dr. Blane Ohara for medical support Discussed client hospitalization in the past.  Discussed her working with medical care providers  to develop client care plan at this time Thanked client for phone call. Client was appreciative of phone call from LCSW        SDOH assessments and interventions completed:   Yes  SDOH Interventions Today    Flowsheet Row Most Recent Value  SDOH Interventions   Depression Interventions/Treatment  Counseling, Currently on Treatment  Physical Activity Interventions Other (Comments)  [participates in cardiac rehab program as scheduled weekly]  Stress Interventions Provide Counseling  [stress related to managing medical needs]        Care Coordination Interventions:  Yes, provided   Interventions Today    Flowsheet Row Most Recent Value  Chronic Disease   Chronic disease during today's visit Other  [spoke with client about client needs]  General Interventions   General Interventions Discussed/Reviewed General Interventions Discussed, [employer] program support  with RN, LCSW, Pharmacist]  Exercise Interventions   Exercise Discussed/Reviewed Physical Activity  Physical Activity Discussed/Reviewed Physical Activity Discussed  Education Interventions   Education Provided Provided Education  Provided Verbal Education On Community Resources  [discussed cardiac rehab program involvement]  Mental Health Interventions   Mental Health Discussed/Reviewed Coping Strategies  [discussed mood of client. discussed self care activities. She likes to care for her pet bird. discussed relaxation approaches]  Nutrition Interventions   Nutrition Discussed/Reviewed Nutrition Discussed  Pharmacy Interventions   Pharmacy Dicussed/Reviewed Pharmacy Topics Discussed  Safety Interventions   Safety Discussed/Reviewed Fall Risk       Follow up plan: Follow up call scheduled for 02/05/23 at 3:30 PM     Encounter Outcome:  Pt. Visit Completed   Kelton Pillar.Joaquim Tolen MSW, LCSW Licensed Visual merchandiser [facility] Care Management [phone]

## 2023-01-22 NOTE — Patient Instructions (Signed)
Visit Information  Thank you for taking time to visit with me today. Please don't hesitate to contact me if I can be of assistance to you.   Following are the goals we discussed today:   Goals Addressed             This Visit's Progress    Patient Stated she has a blockage in her heart. Medical providers are monitoring heart blockage .She participates in cardiac rehab program       Interventions: Spoke with client about client needs Discussed program support with RN, LCSW, Pharmacist Provided counseling support to Illeanna Mauzy Talked with Alwilda about self care and self care activities. Reviewed medication procurement. She had prescribed medications Reviewed support of spouse. Her spouse is very supportive. He obtains groceries for couple. He helps client as needed Discussed mobility of client. She lives in two level home. There are 13 steps from one level of home to another Discussed client involvement in cardiac rehab program. She said this program is scheduled to last about 3 months. Discussed energy level of client. Discussed mood of client. Encouraged client to practice relaxation methods to help her cope with stress issues Gave client LCSW phone number of [phone] and encouraged her to call LCSW as needed for SW support She sees endocrinologist for support. She sees cardiologist for support. She sees PCP, Dr. Blane Ohara for medical support Discussed client hospitalization in the past.  Discussed her working with medical care providers  to develop client care plan at this time Thanked client for phone call. Client was appreciative of phone call from LCSW        Our next appointment is by telephone on 02/05/23 at 3:30 PM   Please call the care guide team at [phone] if you need to cancel or reschedule your appointment.   If you are experiencing a Mental Health or Behavioral Health Crisis or need someone to talk to, please go to [facility] [address] [place] [phone])   The patient verbalized understanding of instructions, educational materials, and care plan provided today and DECLINED offer to receive copy of patient instructions, educational materials, and care plan.   The patient has been provided with contact information for the care management team and has been advised to call with any health related questions or concerns.   Kelton Pillar.Edrian Melucci MSW, LCSW Licensed Clinical Social Worker [facility] Care Management [phone]

## 2023-01-23 ENCOUNTER — Other Ambulatory Visit: Payer: Self-pay | Admitting: Family Medicine

## 2023-01-24 ENCOUNTER — Inpatient Hospital Stay: Payer: PPO

## 2023-01-30 ENCOUNTER — Telehealth: Payer: PPO

## 2023-01-30 ENCOUNTER — Other Ambulatory Visit: Payer: Self-pay | Admitting: Family Medicine

## 2023-01-30 ENCOUNTER — Ambulatory Visit (INDEPENDENT_AMBULATORY_CARE_PROVIDER_SITE_OTHER): Payer: PPO

## 2023-01-30 DIAGNOSIS — F411 Generalized anxiety disorder: Secondary | ICD-10-CM

## 2023-01-30 DIAGNOSIS — I130 Hypertensive heart and chronic kidney disease with heart failure and stage 1 through stage 4 chronic kidney disease, or unspecified chronic kidney disease: Secondary | ICD-10-CM

## 2023-01-30 DIAGNOSIS — E1142 Type 2 diabetes mellitus with diabetic polyneuropathy: Secondary | ICD-10-CM

## 2023-01-30 DIAGNOSIS — F439 Reaction to severe stress, unspecified: Secondary | ICD-10-CM

## 2023-01-30 DIAGNOSIS — F330 Major depressive disorder, recurrent, mild: Secondary | ICD-10-CM

## 2023-01-30 NOTE — Patient Instructions (Signed)
Please call the care guide team at [phone] if you need to cancel or reschedule your appointment.   If you are experiencing a Mental Health or Behavioral Health Crisis or need someone to talk to, please call the Suicide and Crisis Lifeline: 988 call the [place] National Suicide Prevention Lifeline: 817 305 7074 or TTY: 629 396 0911 TTY 8016829875) to talk to a trained counselor call 1-800-273-TALK (toll free, 24 hour hotline) go to [facility] [address], [place] [phone])   Following is a copy of the CCM Program Consent:  CCM service includes personalized support from designated clinical staff supervised by the physician, including individualized plan of care and coordination with other care providers 24/7 contact phone numbers for assistance for urgent and routine care needs. Service will only be billed when office clinical staff spend 20 minutes or more in a month to coordinate care. Only one practitioner may furnish and bill the service in a calendar month. The patient may stop CCM services at amy time (effective at the end of the month) by phone call to the office staff. The patient will be responsible for cost sharing (co-pay) or up to 20% of the service fee (after annual deductible is met)  Following is a copy of your full provider care plan:   Goals Addressed             This Visit's Progress    CCM Expected Outcome:  Monitor, Self-Manage and Reduce Symptoms of Diabetes       Current Barriers:  Chronic Disease Management support and education needs related to effective management of DM Lab Results  Component Value Date   HGBA1C 7.4 (H) 08/31/2022  At endocrinologist on 01-11-2023 A1C is 8.1%   Planned Interventions: Provided education to patient about basic DM disease process. The patient is working with the endocrinologist on getting her blood sugars stable. When she was in the hospital earlier in the year she was having  some fluctuations and they were also not giving her one of her medications she takes. The patient states she had recent changes and is hopeful that this is going to work well.  Reviewed medications with patient and discussed importance of medication adherence. Patient states compliance with medications. The patient has her medications and denies any new medication needs with her DM medications;        Reviewed prescribed diet with patient heart healthy/ADA diet. Is compliant of heart healthy/ADA diet. The patient is mindful of what she eats and following dietary restrictions  ; Counseled on importance of regular laboratory monitoring as prescribed. Has regular lab work done. The patient had labs with the endocrinologist on 01-11-2023. Review of A1C levels.  Discussed plans with patient for ongoing care management follow up and provided patient with direct contact information for care management team;      Provided patient with written educational materials related to hypo and hyperglycemia and importance of correct treatment. She is having some fluctuations in her blood sugars. States yesterday morning it dropped to 59 at 216 in the morning and then it went up to 167 at 426 without any interventions.  The patient was not having sx and sx. Discussed it could possibly be her sensor is not reading correctly. She said she did not think of this and is going to change out her sensor when she gets off of the phone.;      Reviewed scheduled/upcoming provider appointments including: 04-04-2023 at 1040 am Advised patient, providing education and rationale, to  check cbg twice daily and when you have symptoms of low or high blood sugar and record. Has a continuous reader. Her average has been around 186. Has had changes in her medications. Since new sensor placed the readings have been up and down. Review of the cause may be the sensor. She is going to try and change out sensors. Education provided.       call provider  for findings outside established parameters;       Referral made to community resources care guide team for assistance with food insecurities and resources in her area to help with expressed needs. Has talked with care guides and had resources mailed.  ;      Review of patient status, including review of consultants reports, relevant laboratory and other test results, and medications completed;       Advised patient to discuss changes in DM, questions, and concerns with provider;      Screening for signs and symptoms of depression related to chronic disease state;        Assessed social determinant of health barriers;         Symptom Management: Take medications as prescribed   Attend all scheduled provider appointments Call provider office for new concerns or questions  call the Suicide and Crisis Lifeline: 988 call the [place] National Suicide Prevention Lifeline: 9015615304 or TTY: (801)742-0814 TTY 423-075-2639) to talk to a trained counselor call 1-800-273-TALK (toll free, 24 hour hotline) if experiencing a Mental Health or Behavioral Health Crisis  keep appointment with eye doctor check feet daily for cuts, sores or redness trim toenails straight across manage portion size wash and dry feet carefully every day wear comfortable, cotton socks wear comfortable, well-fitting shoes  Follow Up Plan: Telephone follow up appointment with care management team member scheduled for: 03-06-2023 at 230 pm       CCM Expected Outcome:  Monitor, Self-Manage and Reduce Symptoms of: Depression, Anxiety, Stress       Current Barriers:  Knowledge Deficits related to resources available to help with expressed needs at this time due to her husband losing his job and his unemployment has expired Care Coordination needs related to resources to help meet financial needs at this time in a patient with stress, anxiety, and depression  Chronic Disease Management support and education needs related to  effective management of stress, anxiety, and depression Financial Constraints.   Planned Interventions: Evaluation of current treatment plan related to depression, stress, and anxiety and patient's adherence to plan as established by provider. The patient is doing good. Has been a little stressed, her mother is dealing with a cancer diagnosis for the 3rd time and yesterday they found out that her mother in law has pancreatic cancer. She is being very mindful of her health as well. The patient states that her husband is very worried about her.  Reflective listening and support given.  Reviewed medications with patient and discussed compliance. The patient is compliant with medications. She is unable to get her refill for Norco 7.5-325 mg through [facility]. The patient ask if the pcp could send a script to [place] [facility]. Secure message sent to the pcp and pharm D asking for assistance and recommendations. Also review of pharm D support and secured an appointment with the pharm D for outreach on 02-19-2023 at 11 am  Provided patient with care guide and SW support as well as support from the interdisciplinary team for management of stress, anxiety, and depression  educational materials related  to effective management of stress, anxiety, and depression. The patient is actively working with the LCSW for ongoing support and education. Reviewed scheduled/upcoming provider appointments including [phone] at 1040 am Care Guide referral for community resources for [facility] for food insecurities and assistance with utilities. Has information from the care guides  Social Work referral for education and support and resources for effective management of stress, anxiety, and depression. The patient continues to work with the SW for assistance, education, and support.  Discussed plans with patient for ongoing care management follow up and provided patient with direct contact information for care management  team Advised patient to discuss changes in mood, increased anxiety, depression, stress, or other mental health needs.  with provider Screening for signs and symptoms of depression related to chronic disease state  Assessed social determinant of health barriers  Symptom Management: Take medications as prescribed   Attend all scheduled provider appointments Call provider office for new concerns or questions  call the Suicide and Crisis Lifeline: 988 call the [place] National Suicide Prevention Lifeline: 513-084-9903 or TTY: (716) 140-6669 TTY (931)265-1160) to talk to a trained counselor call 1-800-273-TALK (toll free, 24 hour hotline) if experiencing a Mental Health or Behavioral Health Crisis   Follow Up Plan: Telephone follow up appointment with care management team member scheduled for: 03-06-2023 at 230 pm       CCM Expected Outcome:  Monitor, Self-Manage, and Reduce Symptoms of Hypertension       Current Barriers:  Chronic Disease Management support and education needs related to effective management of HTN BP Readings from Last 3 Encounters:  12/25/22 122/64  12/17/22 (!) 117/53  12/16/22 (!) 128/53   Wt Readings from Last 3 Encounters:  12/25/22 229 lb (103.9 kg)  12/17/22 229 lb (103.9 kg)  12/16/22 224 lb 14.4 oz (102 kg)      Planned Interventions: Evaluation of current treatment plan related to hypertension self management and patient's adherence to plan as established by provider. The patient states her blood pressures have been fluctuating some. She does take at home and records. Sees specialist on a regular basis. She is watching her fluid balance and her first indicator that she has is her wedding band being tight on her finger. Usually when she weights she has gain 0.5 pounds over night, She works with the cardiologist on a regular basis for fluid balance and she also is back in cardiac rehab 3 times a week. Education and support provided.  Provided education to patient re:  stroke prevention, s/s of heart attack and stroke. The patient has high risk factors for HA and stroke. Had 2 heart attacks in 2022. States that she had taken NTG with minimal relief. Reflective listening. Praised for being proactive in her care and seeking help; Reviewed prescribed diet heart healthy/ADA diet. Review and education  Reviewed medications with patient and discussed importance of compliance. The patient states compliance with medications. Denies any medication needs at this time.;  Discussed plans with patient for ongoing care management follow up and provided patient with direct contact information for care management team; Advised patient, providing education and rationale, to monitor blood pressure daily and record, calling PCP for findings outside established parameters. The patient states her blood pressures systolic are around 120's and diastolic about 63 to 66;  Reviewed scheduled/upcoming provider appointments including: 04-04-2023 at 1040 am Advised patient to discuss changes in her blood pressures or heart health  with provider; Provided education on prescribed diet heart healthy/ADA diet. The patient  is working with DSS currently hopeful to get food stamps due to her husband being out of work. Also talks to the LCSW on a regular basis;  Discussed complications of poorly controlled blood pressure such as heart disease, stroke, circulatory complications, vision complications, kidney impairment, sexual dysfunction;  Screening for signs and symptoms of depression related to chronic disease state;  Assessed social determinant of health barriers;   Symptom Management: Take medications as prescribed   Attend all scheduled provider appointments Call provider office for new concerns or questions  call the Suicide and Crisis Lifeline: 988 call the [place] National Suicide Prevention Lifeline: (713)865-7954 or TTY: 432-817-8085 TTY (701)182-4237) to talk to a trained counselor call  1-800-273-TALK (toll free, 24 hour hotline) if experiencing a Mental Health or Behavioral Health Crisis  check blood pressure 3 times per week write blood pressure results in a log or diary learn about high blood pressure keep a blood pressure log take blood pressure log to all doctor appointments call doctor for signs and symptoms of high blood pressure develop an action plan for high blood pressure keep all doctor appointments take medications for blood pressure exactly as prescribed report new symptoms to your doctor  Follow Up Plan: Telephone follow up appointment with care management team member scheduled for: 03-06-2023 at 230 pm          Patient verbalizes understanding of instructions and care plan provided today and agrees to view in MyChart. Active MyChart status and patient understanding of how to access instructions and care plan via MyChart confirmed with patient.  Telephone follow up appointment with care management team member scheduled for: 03-06-2023 at 230 pm

## 2023-01-30 NOTE — Progress Notes (Incomplete)
[facility]  [address] [place],  [place]  [address] [phone]  Clinic Day:  02/01/22  Referring physician: Blane Ohara, MD  eASSESSMENT & PLAN:   Chronic anemia that has been worsening.  This is anemia of chronic kidney disease/anemia of chronic disease.  Repeat iron studies in March did not reveal iron deficiency, soluble transferrin receptor was normal. No hemolysis was found.  Serum protein electrophoresis is negative for a monoclonal spike.  I therefore recommended that she increase the Retacrit to 20,000 units every 2 weeks. Stool Hemoccults were negative.  Her hemoglobin has come up from 8.4 to 9., and now 9.6 so she is responding to this dose well.  2.   Strong family history of breast cancer.  I referred her to the genetic counselor.    Plan I increased her Retacrit to 20,000 units every 2 weeks and she seems to be responding to that fairly well. We will check her monthly with labs and see her back in 4 weeks..  She understands and agrees with this plan of care.  I provided 20 minutes of face-to-face time during this this encounter and > 50% was spent counseling as documented under my assessment and plan.    Dellia Beckwith, MD [facility] AT [facility] [address] [place] [address] Dept: [phone] Dept Fax: [phone]    CHIEF COMPLAINT:  CC: Iron deficiency  Current Treatment:  Observation   HISTORY OF PRESENT ILLNESS:  Dana [name] is a 62 y.o. female with  a complicated medical history, including stage IV chronic kidney disease, who is referred in consultation by Dr. Barnabas Lister, nephrology, for assessment and management of iron deficiency.  She had worsening anemia, so was given a dose of Retacrit in December.  She then was then found to have a low iron saturation of 11% with a TIBC of 271 and ferritin 106 on February 10th, and gives a history of allergic  reaction to IV iron, so was referred here.  She states she broke out in severe hives after receiving IV iron in the past 1 to 2 years.  She states she also has had blood transfusion on several occasions, most recent being in December 2022.  She has a history of hypoxic brain injury in June 2022, so has difficulty with dates. She reports severe fatigue as well as dyspnea with limited exertion.  She denies overt form of blood loss.  She is on clopidogrel. Review of her records reveal she received 1 unit of packed cells in November 2022 when her hemoglobin was 7.2.  She was given IV Feraheme in February 2022 when admitted for acute on chronic diastolic heart failure.  She reported severe generalized itching after receiving the first dose of Feraheme.  No hives were noted by the examining provider.  The 2nd dose of Feraheme was not given.  She was transfused in February 2022 when her hemoglobin was 7.8 and again in March 2022 when her hemoglobin was 7.   In 2017, when she reported rectal bleeding, as well as black tarry stools, EGD and colonoscopy were recommended.  EGD in September 2017 revealed a pyloric erosion.  I was unable to access the colonoscopy report from September 2017. She had EGD in September 2014 that was normal.  She was diagnosed with gastroparesis in September 2014.  It was noted in her chart that she had a colonoscopy in April 2014 which was normal,  but I could not find a report.  She was also transfused in April 2013 at [facility] when her hemoglobin was 7.4.  She has had chronic anemia since 2010.   Her family history is significant in that her mother had breast cancer twice and ended up with bilateral mastectomies.  She also has 2 maternal aunts that had breast cancer.  I will therefore refer her to the genetic counselor for their opinion regarding testing.  She does not know if her mother has been tested.  I have reviewed her chart and materials related to her extensively and  collaborated history with the patient. Summary of oncologic history is as follows: Oncology History   No history exists.     INTERVAL HISTORY:  Dana [name] is here for routine follow up of anemia of chronic disease, associated with chronic kidney disease stage 4. Patient states that She feels *** and ***     I will see her  back in *** with CBC and CMP.  She  denies signs of infections such as sore throat, sinus drainage, cough or urinary symptoms. She  denies fever or recurrent chills. She  also deny nausea, vomiting, chest pain, or dyspnea. Her  appetite is *** and Her  weight {Weight change:10426}     Dana [name] is here today for repeat clinical assessment.  She reports persistent fatigue, but denies progressive fatigue concerning for worsening anemia.  She reports shortness of breath with exertion with occasional chest comfort, which is attributed to her heart disease.  She denies any overt form of blood loss.  She has occasional diarrhea, but not severe.  She denies fevers or chills. She reports chronic low back pain pain. Her appetite is good. Her weight has increased 1 pounds over last 1 week .  She continues to ambulate with a cane mainly due to low back pain.  She saw a rheumatologist who is now referred her to a neurologist.  She states she has been discharged from the cardiologist who did her mitral valve procedure.     We increased her Retacrit injections to every two weeks in April with 20,000 units of Retacrit. We will continue with this treatment. Her hemoglobin was 9.1 last visit and is now 9.6. Her blood counts look normal. Her  appetite is good, and she has lost 2 pounds since her last visit.   Wt Readings from Last 3 Encounters:  12/25/22 229 lb (103.9 kg)  12/17/22 229 lb (103.9 kg)  12/16/22 224 lb 14.4 oz (102 kg)  She denies fever, chills or other signs of infection.  She denies nausea, vomiting, bowel issues, or abdominal pain.  She denies sore throat, cough, dyspnea, or chest  pain.  HISTORY:   Past Medical History:  Diagnosis Date   Acquired absence of left great toe (HCC)    Acquired absence of other left toe(s) (HCC)    ACS (acute coronary syndrome) (HCC)    Anemia    Anoxic brain injury (HCC)    Anxiety    CAD (coronary artery disease)    DES mLAD 04/07/15; DES dRCA & DES pPDA 08/30/16; DES mCX & PTCA OM1 09/29/20   Chronic diastolic (congestive) heart failure (HCC)    Chronic pain    Chronic systolic (congestive) heart failure (HCC)    CKD (chronic kidney disease)    Contrast dye induced nephropathy, possible 09/03/2020   COPD (chronic obstructive pulmonary disease) (HCC)    Diabetes (HCC)type 1    Diastolic CHF (HCC)  Dyspnea    Family history of breast cancer 10/23/2021   Gastro-esophageal reflux disease without esophagitis    Hyperlipidemia    Hypertension    Hypertensive heart disease with heart failure (HCC)    Hypoxemia    Idiopathic gout, multiple sites    Leukocytosis 03/29/2022   Localized edema    Low back pain    Mitral regurgitation    Mitral regurgitation    Mixed hyperlipidemia    Mixed incontinence    Morbid (severe) obesity due to excess calories (HCC)    Neuromuscular disorder (HCC)    Neuropathy    Non-ST elevation (NSTEMI) myocardial infarction (HCC)    08/29/20, 09/29/20   OSA (obstructive sleep apnea) 09/03/2020   Other specified hypothyroidism    PAF (paroxysmal atrial fibrillation) (HCC)    s/p pulmonary vein isolation by cryoablation 10/04/15 Dr. Rudolpho Sevin in [facility]   PEA (Pulseless electrical activity) [facility])    ~ 01/13/21 at [facility] during admission DKA, volume overload, possible CAP, hypoxia s/p ACLS with ROSC ~ 10-15   Pneumonia    Primary insomnia    Rheumatoid arthritis (HCC)    Stroke (HCC)    Thyroid disease    Type 1 diabetes mellitus (HCC)    Vitamin D deficiency     Past Surgical History:  Procedure Laterality Date   ABDOMINAL HYSTERECTOMY     Partial- Still has both ovaries    CARDIOVASCULAR STRESS TEST  08/13/2014   Nuclear; Normal   CARPAL TUNNEL RELEASE     CATARACT EXTRACTION, BILATERAL     CESAREAN SECTION     CHOLECYSTECTOMY     CORONARY ANGIOPLASTY WITH STENT PLACEMENT     3 blockages/ 1 stent   CORONARY PRESSURE/FFR STUDY N/A 09/07/2022   Procedure: INTRAVASCULAR PRESSURE WIRE/FFR STUDY;  Surgeon: Swaziland, Peter M, MD;  Location: [facility] INVASIVE CV LAB;  Service: Cardiovascular;  Laterality: N/A;   CORONARY STENT INTERVENTION N/A 09/29/2020   Procedure: CORONARY STENT INTERVENTION;  Surgeon: Tonny Bollman, MD;  Location: [facility] INVASIVE CV LAB;  Service: Cardiovascular;  Laterality: N/A;  CFX   CORONARY STENT INTERVENTION N/A 09/07/2022   Procedure: CORONARY STENT INTERVENTION;  Surgeon: Swaziland, Peter M, MD;  Location: [facility] INVASIVE CV LAB;  Service: Cardiovascular;  Laterality: N/A;   CORONARY/GRAFT ACUTE MI REVASCULARIZATION N/A 12/08/2022   Procedure: Coronary/Graft Acute MI Revascularization;  Surgeon: Marykay Lex, MD;  Location: [facility] INVASIVE CV LAB;  Service: Cardiovascular;  Laterality: N/A;   EYE SURGERY     LEFT HEART CATH AND CORONARY ANGIOGRAPHY N/A 09/29/2020   Procedure: LEFT HEART CATH AND CORONARY ANGIOGRAPHY;  Surgeon: Tonny Bollman, MD;  Location: [facility] INVASIVE CV LAB;  Service: Cardiovascular;  Laterality: N/A;   LEFT HEART CATH AND CORONARY ANGIOGRAPHY N/A 09/07/2022   Procedure: LEFT HEART CATH AND CORONARY ANGIOGRAPHY;  Surgeon: Swaziland, Peter M, MD;  Location: [facility] INVASIVE CV LAB;  Service: Cardiovascular;  Laterality: N/A;   LEFT HEART CATH AND CORONARY ANGIOGRAPHY N/A 12/08/2022   Procedure: LEFT HEART CATH AND CORONARY ANGIOGRAPHY;  Surgeon: Marykay Lex, MD;  Location: [facility] INVASIVE CV LAB;  Service: Cardiovascular;  Laterality: N/A;   MITRAL VALVE REPAIR N/A 05/18/2021   Procedure: MITRAL VALVE REPAIR;  Surgeon: Orbie Pyo, MD;  Location: [facility] INVASIVE CV LAB;  Service: Cardiovascular;  Laterality: N/A;   RIGHT HEART CATH N/A 04/27/2021    Procedure: RIGHT HEART CATH;  Surgeon: Dolores Patty, MD;  Location: [facility] INVASIVE CV LAB;  Service: Cardiovascular;  Laterality:  N/A;   RIGHT/LEFT HEART CATH AND CORONARY ANGIOGRAPHY N/A 01/07/2017   Procedure: Right/Left Heart Cath and Coronary Angiography;  Surgeon: Laurey Morale, MD;  Location: [facility] INVASIVE CV LAB;  Service: Cardiovascular;  Laterality: N/A;   ROTATOR CUFF REPAIR     TEE WITHOUT CARDIOVERSION N/A 04/28/2021   Procedure: TRANSESOPHAGEAL ECHOCARDIOGRAM (TEE);  Surgeon: Dolores Patty, MD;  Location: [facility] ENDOSCOPY;  Service: Cardiovascular;  Laterality: N/A;   TEE WITHOUT CARDIOVERSION N/A 05/18/2021   Procedure: TRANSESOPHAGEAL ECHOCARDIOGRAM (TEE);  Surgeon: Orbie Pyo, MD;  Location: [facility] INVASIVE CV LAB;  Service: Cardiovascular;  Laterality: N/A;   TOE AMPUTATION Left    US ECHOCARDIOGRAPHY  05/2017   Normal    Family History  Problem Relation Age of Onset   Breast cancer Mother 34       contralateral breast ca dx 51   Coronary artery disease Mother    Hypertension Mother    Hyperlipidemia Mother    Diabetes type II Mother    Lung cancer Mother 36   Diabetes Father    Hypertension Father    Mental illness Sister    Bipolar disorder Other    Depression Other    Schizophrenia Other    Cancer Other        MGF's sisters x2; unknown type    Social History:  reports that she quit smoking about 40 years ago. Her smoking use included cigarettes. She started smoking about 41 years ago. She has been exposed to tobacco smoke. She has never used smokeless tobacco. She reports that she does not drink alcohol and does not use drugs.The patient is alone today.  Allergies:  Allergies  Allergen Reactions   Naproxen Hives and Rash   Shellfish-Derived Products Hives, Swelling and Other (See Comments)    Angioedema    Strawberry (Diagnostic) Hives   Celecoxib Other (See Comments)    Stomach pain   Glucosamine Hives, Swelling and Other (See Comments)     Angioedema    Iron     IV iron transfusion - hives - Feb 2022 hospitalization   Metoclopramide Other (See Comments)    Facial twitching and stuttering   Metolazone Other (See Comments)    Acute renal failure   Statins Other (See Comments)    Muscle pain    Current Medications: Current Outpatient Medications  Medication Sig Dispense Refill   allopurinol (ZYLOPRIM) 300 MG tablet TAKE ONE TABLET BY MOUTH ONCE DAILY 90 tablet 0   aspirin EC 81 MG tablet Take 1 tablet (81 mg total) by mouth daily. Swallow whole. 30 tablet 12   busPIRone (BUSPAR) 10 MG tablet Take 1 tablet (10 mg total) by mouth 3 (three) times daily. 90 tablet 2   carvedilol (COREG) 12.5 MG tablet TAKE ONE TABLET BY MOUTH twice daily 180 tablet 3   cephALEXin (KEFLEX) 250 MG capsule Take 250 mg by mouth at bedtime.     Continuous Blood Gluc Sensor (FREESTYLE LIBRE 2 SENSOR) MISC USE TO check blood glucose AS DIRECTED AND CHANGE sensor every 14 DAYS     CRANBERRY PO Take 2 tablets by mouth daily.     EPINEPHrine 0.3 mg/0.3 mL IJ SOAJ injection Use as directed for life-threatening allergic reaction. (Patient not taking: Reported on 12/25/2022) 4 Device 3   estradiol (ESTRACE) 0.1 MG/GM vaginal cream Place 1 Applicatorful vaginally every other day. (Patient not taking: Reported on 12/25/2022)     ezetimibe (ZETIA) 10 MG tablet Take 1 tablet (10 mg total) by  mouth daily. 90 tablet 1   HYDROcodone-acetaminophen (NORCO) 7.5-325 MG tablet TAKE ONE TABLET BY MOUTH THREE TIMES DAILY AS NEEDED 90 tablet 0   insulin aspart (NOVOLOG FLEXPEN) 100 UNIT/ML FlexPen 2-10 U before meals based on SSI. 15 mL 3   Insulin Pen Needle (BD PEN NEEDLE NANO 2ND GEN) 32G X 4 MM MISC Inject 1 each into the skin in the morning, at noon, in the evening, and at bedtime. 600 each 3   isosorbide mononitrate (IMDUR) 60 MG 24 hr tablet Take 1 tablet (60 mg total) by mouth daily. 30 tablet 6   latanoprost (XALATAN) 0.005 % ophthalmic solution Place 1 drop into  both eyes at bedtime.     LEVEMIR FLEXPEN 100 UNIT/ML FlexPen Inject 50 units into THE SKIN daily 15 mL 1   linaclotide (LINZESS) 145 MCG CAPS capsule TAKE ONE CAPSULE BY MOUTH BEFORE BREAKFAST 90 capsule 0   LORazepam (ATIVAN) 0.5 MG tablet TAKE ONE TABLET BY MOUTH ONCE daily AS NEEDED FOR ANXIETY 30 tablet 2   magnesium oxide (MAG-OX) 400 MG tablet Take 1 tablet (400 mg total) by mouth daily. Take 2 (400 mg) daily=800 mg 60 tablet 1   nitroGLYCERIN (NITROSTAT) 0.4 MG SL tablet DISSOLVE ONE TABLET UNDER THE TONGUE EVERY 5 MINUTES AS NEEDED FOR CHEST PAIN.  DO NOT EXCEED A TOTAL OF 3 DOSES IN 15 MINUTES 25 tablet 0   Omega-3 Fatty Acids (FISH OIL PO) Take 1 capsule by mouth daily.     OXYGEN Inhale 2 L into the lungs at bedtime.     pantoprazole (PROTONIX) 40 MG tablet TAKE ONE TABLET BY MOUTH ONCE DAILY 90 tablet 0   polyethylene glycol (MIRALAX / GLYCOLAX) 17 g packet Take 17 g by mouth daily. 14 each 0   potassium chloride SA (KLOR-CON M) 20 MEQ tablet TAKE ONE TABLET BY MOUTH ONCE DAILY 90 tablet 0   Probiotic CAPS Take 1 capsule by mouth daily.     promethazine (PHENERGAN) 25 MG tablet Take 1 tablet (25 mg total) by mouth daily as needed. (Patient taking differently: Take 25 mg by mouth daily as needed for nausea or vomiting.) 30 tablet 3   REPATHA SURECLICK 140 MG/ML SOAJ inject 140mg  intot HEART SKIN every 14 DAYS 6 mL 1   ticagrelor (BRILINTA) 90 MG TABS tablet Take 1 tablet (90 mg total) by mouth 2 (two) times daily. 60 tablet 6   torsemide (DEMADEX) 20 MG tablet TAKE 4 TABLETS BY MOUTH ONCE daily 336 tablet 3   No current facility-administered medications for this visit.    REVIEW OF SYSTEMS:  Review of Systems  Constitutional:  Positive for fatigue.  Respiratory:  Positive for shortness of breath.   Musculoskeletal:  Positive for back pain.      VITALS:  There were no vitals taken for this visit.  Wt Readings from Last 3 Encounters:  12/25/22 229 lb (103.9 kg)  12/17/22 229  lb (103.9 kg)  12/16/22 224 lb 14.4 oz (102 kg)    There is no height or weight on file to calculate BMI.  Performance status (ECOG): 1 - Symptomatic but completely ambulatory  PHYSICAL EXAM:  Physical Exam Constitutional:      Appearance: Normal appearance.  HENT:     Head: Normocephalic and atraumatic.     Nose: Nose normal.     Mouth/Throat:     Pharynx: Oropharynx is clear.  Eyes:     Extraocular Movements: Extraocular movements intact.     Conjunctiva/sclera:  Conjunctivae normal.     Pupils: Pupils are equal, round, and reactive to light.  Cardiovascular:     Rate and Rhythm: Normal rate and regular rhythm.     Heart sounds: Normal heart sounds.  Pulmonary:     Breath sounds: Normal breath sounds.  Abdominal:     General: Bowel sounds are normal.     Palpations: Abdomen is soft.  Musculoskeletal:        General: Normal range of motion.     Cervical back: Normal range of motion.  Skin:    General: Skin is warm.  Neurological:     General: No focal deficit present.     Mental Status: She is alert and oriented to person, place, and time.  Psychiatric:        Mood and Affect: Mood normal.        Behavior: Behavior normal.        Thought Content: Thought content normal.        Judgment: Judgment normal.    LABS:      Latest Ref Rng & Units 12/19/2022    8:23 AM 12/16/2022    2:28 AM 12/15/2022    6:40 AM  CBC  WBC 3.4 - 10.8 x10E3/uL 14.3  15.8  12.7   Hemoglobin 11.1 - 15.9 g/dL 9.4  8.8  8.7   Hematocrit 34.0 - 46.6 % 31.2  28.3  28.3   Platelets 150 - 450 x10E3/uL 328  337  311       Latest Ref Rng & Units 12/19/2022    8:23 AM 12/16/2022    2:28 AM 12/15/2022    6:40 AM  CMP  Glucose 70 - 99 mg/dL 098  119  147   BUN 6 - 24 mg/dL 70  76  75   Creatinine 0.57 - 1.00 mg/dL 8.29  5.62  1.30   Sodium 134 - 144 mmol/L 138  136  137   Potassium 3.5 - 5.2 mmol/L 4.7  3.7  3.9   Chloride 96 - 106 mmol/L 96  98  100   CO2 20 - 29 mmol/L 22  27  24    Calcium 8.7 -  10.2 mg/dL 9.4  8.8  8.9   Total Protein 6.0 - 8.5 g/dL 7.1     Total Bilirubin 0.0 - 1.2 mg/dL 0.4     Alkaline Phos 44 - 121 IU/L 127     AST 0 - 40 IU/L 12     ALT 0 - 32 IU/L 9       Lab Results  Component Value Date   TOTALPROTELP 7.3 09/13/2021   ALBUMINELP 3.5 09/13/2021   A1GS 0.3 09/13/2021   A2GS 0.8 09/13/2021   BETS 1.0 09/13/2021   GAMS 1.8 09/13/2021   MSPIKE Not Observed 09/13/2021   Lab Results  Component Value Date   TIBC 213 (L) 12/11/2022   TIBC 279 10/02/2022   TIBC 262 07/19/2022   FERRITIN 93 10/02/2022   FERRITIN 108 07/19/2022   FERRITIN 141 03/29/2022   IRONPCTSAT 7 (L) 12/11/2022   IRONPCTSAT 12 10/02/2022   IRONPCTSAT 13 07/19/2022   Lab Results  Component Value Date   LDH 176 09/13/2021   Component Ref Range & Units 01/11/2023  A1C % 4.2 - 5.6 % 8.1 Abnormal     STUDIES:  No results found.   EXAM: 03/14/2022 DIGITAL SCREENING BILATERAL MAMMOGRAM WITH TOMOSYNTHESIS AND CAD IMPRESSION: No mammographic evidence of malignancy.     I,Oluwatobi Asade,acting as  a scribe for Dellia Beckwith, MD.,have documented all relevant documentation on the behalf of Dellia Beckwith, MD,as directed by  Dellia Beckwith, MD while in the presence of Dellia Beckwith, MD.

## 2023-01-30 NOTE — Chronic Care Management (AMB) (Signed)
Chronic Care Management   CCM RN Visit Note  01/30/2023 Name: Dana [name] MRN: [id number] DOB: January 29, 1961  Subjective: Dana [name] is a 62 y.o. year old female who is a primary care patient of Cox, Kirsten, MD. The patient was referred to the Chronic Care Management team for assistance with care management needs subsequent to provider initiation of CCM services and plan of care.    Today's Visit:  Engaged with patient by telephone for follow up visit.     SDOH Interventions Today    Flowsheet Row Most Recent Value  SDOH Interventions   Transportation Interventions Intervention Not Indicated  Health Literacy Interventions Intervention Not Indicated         Goals Addressed             This Visit's Progress    CCM Expected Outcome:  Monitor, Self-Manage and Reduce Symptoms of Diabetes       Current Barriers:  Chronic Disease Management support and education needs related to effective management of DM Lab Results  Component Value Date   HGBA1C 7.4 (H) 08/31/2022  At endocrinologist on 01-11-2023 A1C is 8.1%   Planned Interventions: Provided education to patient about basic DM disease process. The patient is working with the endocrinologist on getting her blood sugars stable. When she was in the hospital earlier in the year she was having some fluctuations and they were also not giving her one of her medications she takes. The patient states she had recent changes and is hopeful that this is going to work well.  Reviewed medications with patient and discussed importance of medication adherence. Patient states compliance with medications. The patient has her medications and denies any new medication needs with her DM medications;        Reviewed prescribed diet with patient heart healthy/ADA diet. Is compliant of heart healthy/ADA diet. The patient is mindful of what she eats and following dietary restrictions  ; Counseled on importance of regular laboratory monitoring as  prescribed. Has regular lab work done. The patient had labs with the endocrinologist on 01-11-2023. Review of A1C levels.  Discussed plans with patient for ongoing care management follow up and provided patient with direct contact information for care management team;      Provided patient with written educational materials related to hypo and hyperglycemia and importance of correct treatment. She is having some fluctuations in her blood sugars. States yesterday morning it dropped to 59 at 216 in the morning and then it went up to 167 at 426 without any interventions.  The patient was not having sx and sx. Discussed it could possibly be her sensor is not reading correctly. She said she did not think of this and is going to change out her sensor when she gets off of the phone.;      Reviewed scheduled/upcoming provider appointments including: 04-04-2023 at 1040 am Advised patient, providing education and rationale, to check cbg twice daily and when you have symptoms of low or high blood sugar and record. Has a continuous reader. Her average has been around 186. Has had changes in her medications. Since new sensor placed the readings have been up and down. Review of the cause may be the sensor. She is going to try and change out sensors. Education provided.       call provider for findings outside established parameters;       Referral made to community resources care guide team for assistance with food insecurities and resources in her  area to help with expressed needs. Has talked with care guides and had resources mailed.  ;      Review of patient status, including review of consultants reports, relevant laboratory and other test results, and medications completed;       Advised patient to discuss changes in DM, questions, and concerns with provider;      Screening for signs and symptoms of depression related to chronic disease state;        Assessed social determinant of health barriers;         Symptom  Management: Take medications as prescribed   Attend all scheduled provider appointments Call provider office for new concerns or questions  call the Suicide and Crisis Lifeline: 988 call the [place] National Suicide Prevention Lifeline: (475) 810-4326 or TTY: (989)718-4395 TTY 509-101-5770) to talk to a trained counselor call 1-800-273-TALK (toll free, 24 hour hotline) if experiencing a Mental Health or Behavioral Health Crisis  keep appointment with eye doctor check feet daily for cuts, sores or redness trim toenails straight across manage portion size wash and dry feet carefully every day wear comfortable, cotton socks wear comfortable, well-fitting shoes  Follow Up Plan: Telephone follow up appointment with care management team member scheduled for: 03-06-2023 at 230 pm       CCM Expected Outcome:  Monitor, Self-Manage and Reduce Symptoms of: Depression, Anxiety, Stress       Current Barriers:  Knowledge Deficits related to resources available to help with expressed needs at this time due to her husband losing his job and his unemployment has expired Care Coordination needs related to resources to help meet financial needs at this time in a patient with stress, anxiety, and depression  Chronic Disease Management support and education needs related to effective management of stress, anxiety, and depression Financial Constraints.   Planned Interventions: Evaluation of current treatment plan related to depression, stress, and anxiety and patient's adherence to plan as established by provider. The patient is doing good. Has been a little stressed, her mother is dealing with a cancer diagnosis for the 3rd time and yesterday they found out that her mother in law has pancreatic cancer. She is being very mindful of her health as well. The patient states that her husband is very worried about her.  Reflective listening and support given.  Reviewed medications with patient and discussed compliance.  The patient is compliant with medications. She is unable to get her refill for Norco 7.5-325 mg through [facility]. The patient ask if the pcp could send a script to [facility]. Secure message sent to the pcp and pharm D asking for assistance and recommendations. Also review of pharm D support and secured an appointment with the pharm D for outreach on 02-19-2023 at 11 am  Provided patient with care guide and SW support as well as support from the interdisciplinary team for management of stress, anxiety, and depression  educational materials related to effective management of stress, anxiety, and depression. The patient is actively working with the LCSW for ongoing support and education. Reviewed scheduled/upcoming provider appointments including [phone] at 1040 am Care Guide referral for community resources for [facility] for food insecurities and assistance with utilities. Has information from the care guides  Social Work referral for education and support and resources for effective management of stress, anxiety, and depression. The patient continues to work with the SW for assistance, education, and support.  Discussed plans with patient for ongoing care management follow up and provided patient with direct  contact information for care management team Advised patient to discuss changes in mood, increased anxiety, depression, stress, or other mental health needs.  with provider Screening for signs and symptoms of depression related to chronic disease state  Assessed social determinant of health barriers  Symptom Management: Take medications as prescribed   Attend all scheduled provider appointments Call provider office for new concerns or questions  call the Suicide and Crisis Lifeline: 988 call the [place] National Suicide Prevention Lifeline: 971-002-0304 or TTY: (317)573-8460 TTY (714)541-1935) to talk to a trained counselor call 1-800-273-TALK (toll free, 24 hour hotline) if experiencing  a Mental Health or Behavioral Health Crisis   Follow Up Plan: Telephone follow up appointment with care management team member scheduled for: 03-06-2023 at 230 pm       CCM Expected Outcome:  Monitor, Self-Manage, and Reduce Symptoms of Hypertension       Current Barriers:  Chronic Disease Management support and education needs related to effective management of HTN BP Readings from Last 3 Encounters:  12/25/22 122/64  12/17/22 (!) 117/53  12/16/22 (!) 128/53   Wt Readings from Last 3 Encounters:  12/25/22 229 lb (103.9 kg)  12/17/22 229 lb (103.9 kg)  12/16/22 224 lb 14.4 oz (102 kg)      Planned Interventions: Evaluation of current treatment plan related to hypertension self management and patient's adherence to plan as established by provider. The patient states her blood pressures have been fluctuating some. She does take at home and records. Sees specialist on a regular basis. She is watching her fluid balance and her first indicator that she has is her wedding band being tight on her finger. Usually when she weights she has gain 0.5 pounds over night, She works with the cardiologist on a regular basis for fluid balance and she also is back in cardiac rehab 3 times a week. Education and support provided.  Provided education to patient re: stroke prevention, s/s of heart attack and stroke. The patient has high risk factors for HA and stroke. Had 2 heart attacks in 2022. States that she had taken NTG with minimal relief. Reflective listening. Praised for being proactive in her care and seeking help; Reviewed prescribed diet heart healthy/ADA diet. Review and education  Reviewed medications with patient and discussed importance of compliance. The patient states compliance with medications. Denies any medication needs at this time.;  Discussed plans with patient for ongoing care management follow up and provided patient with direct contact information for care management team; Advised  patient, providing education and rationale, to monitor blood pressure daily and record, calling PCP for findings outside established parameters. The patient states her blood pressures systolic are around 120's and diastolic about 63 to 66;  Reviewed scheduled/upcoming provider appointments including: 04-04-2023 at 1040 am Advised patient to discuss changes in her blood pressures or heart health  with provider; Provided education on prescribed diet heart healthy/ADA diet. The patient is working with DSS currently hopeful to get food stamps due to her husband being out of work. Also talks to the LCSW on a regular basis;  Discussed complications of poorly controlled blood pressure such as heart disease, stroke, circulatory complications, vision complications, kidney impairment, sexual dysfunction;  Screening for signs and symptoms of depression related to chronic disease state;  Assessed social determinant of health barriers;   Symptom Management: Take medications as prescribed   Attend all scheduled provider appointments Call provider office for new concerns or questions  call the Suicide and Crisis Lifeline: 988  call the [place] National Suicide Prevention Lifeline: 6170741122 or TTY: (253)057-6579 TTY 6144087717) to talk to a trained counselor call 1-800-273-TALK (toll free, 24 hour hotline) if experiencing a Mental Health or Behavioral Health Crisis  check blood pressure 3 times per week write blood pressure results in a log or diary learn about high blood pressure keep a blood pressure log take blood pressure log to all doctor appointments call doctor for signs and symptoms of high blood pressure develop an action plan for high blood pressure keep all doctor appointments take medications for blood pressure exactly as prescribed report new symptoms to your doctor  Follow Up Plan: Telephone follow up appointment with care management team member scheduled for: 03-06-2023 at 230 pm           Plan:Telephone follow up appointment with care management team member scheduled for:  03-06-2023 at 230 pm  Alto Denver RN, MSN, CCM RN Care Manager  Chronic Care Management Direct Number: [phone]

## 2023-01-31 ENCOUNTER — Encounter: Payer: Self-pay | Admitting: Family Medicine

## 2023-01-31 ENCOUNTER — Telehealth: Payer: Self-pay

## 2023-01-31 DIAGNOSIS — G4733 Obstructive sleep apnea (adult) (pediatric): Secondary | ICD-10-CM | POA: Diagnosis not present

## 2023-01-31 NOTE — Telephone Encounter (Signed)
Patient called stating that her normal pharmacy does not have the hydrocodone in stock and is requesting if you could send her Hydrocodone to [facility] in [place] because they currently have it in stock. Please advise.

## 2023-02-01 ENCOUNTER — Other Ambulatory Visit: Payer: Self-pay | Admitting: Family Medicine

## 2023-02-01 MED ORDER — HYDROCODONE-ACETAMINOPHEN 7.5-325 MG PO TABS: 1.0000 | ORAL_TABLET | Freq: Three times a day (TID) | ORAL | 0 refills | Status: DC | PRN

## 2023-02-01 NOTE — Telephone Encounter (Signed)
Sent. Dr. Cox  

## 2023-02-04 ENCOUNTER — Telehealth: Payer: Self-pay | Admitting: Oncology

## 2023-02-04 NOTE — Telephone Encounter (Signed)
02/04/23 Patient cancelled appts and did not want to reschedule

## 2023-02-05 ENCOUNTER — Other Ambulatory Visit: Payer: PPO

## 2023-02-05 ENCOUNTER — Ambulatory Visit: Payer: PPO | Admitting: Oncology

## 2023-02-05 ENCOUNTER — Ambulatory Visit: Payer: Self-pay | Admitting: Licensed Clinical Social Worker

## 2023-02-05 NOTE — Patient Instructions (Signed)
Visit Information  Thank you for taking time to visit with me today. Please don't hesitate to contact me if I can be of assistance to you.   Following are the goals we discussed today:   Goals Addressed             This Visit's Progress    Patient Stated she has a blockage in her heart. Medical providers are monitoring heart blockage .She participates in cardiac rehab program       Interventions: Spoke with client  via phone today about her current status and needs Discussed program support with RN, LCSW, Pharmacist Discussed her participation in cardiac rehab program.  She said cardiac rehab program lasted for 12 weeks.   Provided counseling support to [name].  Talked with Thandiwe about self care activities. She likes caring for her pet birds. She likes caring for her pet cats. Discussed support of her spouse. Her spouse is very supportive. He helps obtain groceries, helps with transportation, helps with home care. Reviewed medication procurement. She had prescribed medications Discussed mobility of client.  Discussed mood of client. Discussed client management of anxiety and stress issues Gave client LCSW phone number of [phone] and encouraged her to call LCSW as needed for SW support She sees endocrinologist for support. She sees cardiologist for support. She sees PCP, Dr. Blane Ohara for medical support. She sees PCP in September of 2024. She sees cardiologist in September of 2024.  Discussed pain issues of client. Discussed chest pain issues of client. She has medication to take for chest pain. LCSW talked with client about use of 911 as needed. Client said she was aware of 911 support and said her cardiologist wants her to go to [facility] if she calls 911 for support  Thanked client for phone call. Client was appreciative of phone call from LCSW          Our next appointment is by telephone on 03/26/23 at 3:00 PM   Please call the care guide team at [phone]  if you need to cancel or reschedule your appointment.   If you are experiencing a Mental Health or Behavioral Health Crisis or need someone to talk to, please go to [facility] [address] [place] [phone])   The patient verbalized understanding of instructions, educational materials, and care plan provided today and DECLINED offer to receive copy of patient instructions, educational materials, and care plan.   The patient has been provided with contact information for the care management team and has been advised to call with any health related questions or concerns.   Kelton Pillar.Melady Chow MSW, LCSW Licensed Visual merchandiser [facility] Care Management [phone]

## 2023-02-05 NOTE — Patient Outreach (Signed)
Care Coordination   Follow Up Visit Note   02/05/2023 Name: Dana [name] MRN: [id number] DOB: 09/20/1960  Dana [name] is a 62 y.o. year old female who sees Cox, Fritzi Mandes, MD for primary care. I spoke with  Dana [name] by phone today.  What matters to the patients health and wellness today?  Patient Stated she has a blockage in her heart. Medical providers are monitoring heart blockage .She participates in cardiac rehab program    Goals Addressed             This Visit's Progress    Patient Stated she has a blockage in her heart. Medical providers are monitoring heart blockage .She participates in cardiac rehab program       Interventions: Spoke with client  via phone today about her current status and needs Discussed program support with RN, LCSW, Pharmacist Discussed her participation in cardiac rehab program.  She said cardiac rehab program lasted for 12 weeks.   Provided counseling support to Dana [name].  Talked with Dana [name] about self care activities. She likes caring for her pet birds. She likes caring for her pet cats. Discussed support of her spouse. Her spouse is very supportive. He helps obtain groceries, helps with transportation, helps with home care. Reviewed medication procurement. She had prescribed medications Discussed mobility of client.  Discussed mood of client. Discussed client management of anxiety and stress issues Gave client LCSW phone number of [phone] and encouraged her to call LCSW as needed for SW support She sees endocrinologist for support. She sees cardiologist for support. She sees PCP, Dr. Blane Ohara for medical support. She sees PCP in September of 2024. She sees cardiologist in September of 2024.  Discussed pain issues of client. Discussed chest pain issues of client. She has medication to take for chest pain. LCSW talked with client about use of 911 as needed. Client said she was aware of 911 support and said her cardiologist wants her to  go to [facility] if she calls 911 for support  Thanked client for phone call. Client was appreciative of phone call from LCSW          SDOH assessments and interventions completed:  Yes  SDOH Interventions Today    Flowsheet Row Most Recent Value  SDOH Interventions   Depression Interventions/Treatment  Counseling, Currently on Treatment  Physical Activity Interventions Other (Comments)  [client to resume cardiac rehab as scheduled this week]  Stress Interventions Other (Comment)  [stress issues related to managing medical conditions]        Care Coordination Interventions:  Yes, provided   Interventions Today    Flowsheet Row Most Recent Value  Chronic Disease   Chronic disease during today's visit Other  [spoke with client about client needs]  General Interventions   General Interventions Discussed/Reviewed General Interventions Discussed, Dana [name]  [discussed program support]  Exercise Interventions   Exercise Discussed/Reviewed Physical Activity  [will resume cardiac rehab this week]  Physical Activity Discussed/Reviewed Physical Activity Discussed  Education Interventions   Education Provided Provided Education  Provided Verbal Education On Community Resources  Mental Health Interventions   Mental Health Discussed/Reviewed Coping Strategies, Anxiety  [discussed client managemeent of stress and anxiety issues. encouraged her to use self care activities]  Nutrition Interventions   Nutrition Discussed/Reviewed Nutrition Discussed  Pharmacy Interventions   Pharmacy Dicussed/Reviewed Pharmacy Topics Discussed  Safety Interventions   Safety Discussed/Reviewed Fall Risk        Follow up plan: Follow up  call scheduled for 03/26/23 at 3:00 PM     Encounter Outcome:  Pt. Visit Completed   Kelton Pillar.Shareef Eddinger MSW, LCSW Licensed Visual merchandiser [facility] Care Management [phone]

## 2023-02-07 ENCOUNTER — Inpatient Hospital Stay: Payer: PPO

## 2023-02-08 ENCOUNTER — Ambulatory Visit: Payer: PPO | Admitting: Hematology and Oncology

## 2023-02-08 ENCOUNTER — Other Ambulatory Visit: Payer: PPO

## 2023-02-11 ENCOUNTER — Encounter (HOSPITAL_COMMUNITY): Payer: Self-pay | Admitting: Internal Medicine

## 2023-02-12 ENCOUNTER — Other Ambulatory Visit: Payer: Self-pay

## 2023-02-12 ENCOUNTER — Inpatient Hospital Stay (HOSPITAL_COMMUNITY)
Admission: EM | Admit: 2023-02-12 | Discharge: 2023-02-20 | DRG: 322 | Disposition: A | Discharge status: Home / Self Care | Payer: PPO | Attending: Internal Medicine | Admitting: Internal Medicine

## 2023-02-12 ENCOUNTER — Emergency Department (HOSPITAL_COMMUNITY): Payer: PPO

## 2023-02-12 ENCOUNTER — Encounter (HOSPITAL_COMMUNITY): Payer: Self-pay | Admitting: Emergency Medicine

## 2023-02-12 DIAGNOSIS — R0989 Other specified symptoms and signs involving the circulatory and respiratory systems: Secondary | ICD-10-CM | POA: Diagnosis not present

## 2023-02-12 DIAGNOSIS — I2510 Atherosclerotic heart disease of native coronary artery without angina pectoris: Secondary | ICD-10-CM | POA: Diagnosis present

## 2023-02-12 DIAGNOSIS — Z803 Family history of malignant neoplasm of breast: Secondary | ICD-10-CM

## 2023-02-12 DIAGNOSIS — J449 Chronic obstructive pulmonary disease, unspecified: Secondary | ICD-10-CM | POA: Diagnosis not present

## 2023-02-12 DIAGNOSIS — J9611 Chronic respiratory failure with hypoxia: Secondary | ICD-10-CM | POA: Diagnosis present

## 2023-02-12 DIAGNOSIS — K219 Gastro-esophageal reflux disease without esophagitis: Secondary | ICD-10-CM | POA: Diagnosis present

## 2023-02-12 DIAGNOSIS — M1A9XX0 Chronic gout, unspecified, without tophus (tophi): Secondary | ICD-10-CM | POA: Diagnosis present

## 2023-02-12 DIAGNOSIS — Z955 Presence of coronary angioplasty implant and graft: Secondary | ICD-10-CM

## 2023-02-12 DIAGNOSIS — Z8674 Personal history of sudden cardiac arrest: Secondary | ICD-10-CM

## 2023-02-12 DIAGNOSIS — E108 Type 1 diabetes mellitus with unspecified complications: Secondary | ICD-10-CM | POA: Diagnosis not present

## 2023-02-12 DIAGNOSIS — E119 Type 2 diabetes mellitus without complications: Secondary | ICD-10-CM | POA: Diagnosis not present

## 2023-02-12 DIAGNOSIS — Z8249 Family history of ischemic heart disease and other diseases of the circulatory system: Secondary | ICD-10-CM

## 2023-02-12 DIAGNOSIS — Z87891 Personal history of nicotine dependence: Secondary | ICD-10-CM

## 2023-02-12 DIAGNOSIS — M1990 Unspecified osteoarthritis, unspecified site: Secondary | ICD-10-CM | POA: Diagnosis present

## 2023-02-12 DIAGNOSIS — R739 Hyperglycemia, unspecified: Secondary | ICD-10-CM | POA: Diagnosis not present

## 2023-02-12 DIAGNOSIS — M545 Low back pain, unspecified: Secondary | ICD-10-CM | POA: Diagnosis present

## 2023-02-12 DIAGNOSIS — Z9981 Dependence on supplemental oxygen: Secondary | ICD-10-CM

## 2023-02-12 DIAGNOSIS — E782 Mixed hyperlipidemia: Secondary | ICD-10-CM | POA: Diagnosis not present

## 2023-02-12 DIAGNOSIS — Z90710 Acquired absence of both cervix and uterus: Secondary | ICD-10-CM

## 2023-02-12 DIAGNOSIS — R0602 Shortness of breath: Secondary | ICD-10-CM | POA: Diagnosis not present

## 2023-02-12 DIAGNOSIS — N1832 Chronic kidney disease, stage 3b: Secondary | ICD-10-CM | POA: Diagnosis present

## 2023-02-12 DIAGNOSIS — I10 Essential (primary) hypertension: Secondary | ICD-10-CM | POA: Diagnosis not present

## 2023-02-12 DIAGNOSIS — I130 Hypertensive heart and chronic kidney disease with heart failure and stage 1 through stage 4 chronic kidney disease, or unspecified chronic kidney disease: Secondary | ICD-10-CM | POA: Diagnosis present

## 2023-02-12 DIAGNOSIS — E1065 Type 1 diabetes mellitus with hyperglycemia: Secondary | ICD-10-CM | POA: Diagnosis not present

## 2023-02-12 DIAGNOSIS — R918 Other nonspecific abnormal finding of lung field: Secondary | ICD-10-CM | POA: Diagnosis not present

## 2023-02-12 DIAGNOSIS — F411 Generalized anxiety disorder: Secondary | ICD-10-CM | POA: Diagnosis present

## 2023-02-12 DIAGNOSIS — I252 Old myocardial infarction: Secondary | ICD-10-CM

## 2023-02-12 DIAGNOSIS — I129 Hypertensive chronic kidney disease with stage 1 through stage 4 chronic kidney disease, or unspecified chronic kidney disease: Secondary | ICD-10-CM | POA: Diagnosis not present

## 2023-02-12 DIAGNOSIS — G4733 Obstructive sleep apnea (adult) (pediatric): Secondary | ICD-10-CM | POA: Diagnosis present

## 2023-02-12 DIAGNOSIS — I509 Heart failure, unspecified: Secondary | ICD-10-CM | POA: Diagnosis not present

## 2023-02-12 DIAGNOSIS — Z818 Family history of other mental and behavioral disorders: Secondary | ICD-10-CM

## 2023-02-12 DIAGNOSIS — E669 Obesity, unspecified: Secondary | ICD-10-CM | POA: Diagnosis present

## 2023-02-12 DIAGNOSIS — E1022 Type 1 diabetes mellitus with diabetic chronic kidney disease: Secondary | ICD-10-CM | POA: Diagnosis present

## 2023-02-12 DIAGNOSIS — I5032 Chronic diastolic (congestive) heart failure: Secondary | ICD-10-CM | POA: Diagnosis present

## 2023-02-12 DIAGNOSIS — G894 Chronic pain syndrome: Secondary | ICD-10-CM | POA: Diagnosis present

## 2023-02-12 DIAGNOSIS — Z7902 Long term (current) use of antithrombotics/antiplatelets: Secondary | ICD-10-CM

## 2023-02-12 DIAGNOSIS — R0789 Other chest pain: Secondary | ICD-10-CM | POA: Diagnosis not present

## 2023-02-12 DIAGNOSIS — M549 Dorsalgia, unspecified: Secondary | ICD-10-CM | POA: Diagnosis present

## 2023-02-12 DIAGNOSIS — M069 Rheumatoid arthritis, unspecified: Secondary | ICD-10-CM | POA: Diagnosis not present

## 2023-02-12 DIAGNOSIS — I25118 Atherosclerotic heart disease of native coronary artery with other forms of angina pectoris: Secondary | ICD-10-CM | POA: Diagnosis present

## 2023-02-12 DIAGNOSIS — F32A Depression, unspecified: Secondary | ICD-10-CM | POA: Diagnosis not present

## 2023-02-12 DIAGNOSIS — Z801 Family history of malignant neoplasm of trachea, bronchus and lung: Secondary | ICD-10-CM

## 2023-02-12 DIAGNOSIS — M1009 Idiopathic gout, multiple sites: Secondary | ICD-10-CM | POA: Diagnosis present

## 2023-02-12 DIAGNOSIS — N1411 Contrast-induced nephropathy: Secondary | ICD-10-CM | POA: Diagnosis not present

## 2023-02-12 DIAGNOSIS — Z8673 Personal history of transient ischemic attack (TIA), and cerebral infarction without residual deficits: Secondary | ICD-10-CM

## 2023-02-12 DIAGNOSIS — Z79899 Other long term (current) drug therapy: Secondary | ICD-10-CM

## 2023-02-12 DIAGNOSIS — D649 Anemia, unspecified: Secondary | ICD-10-CM | POA: Diagnosis not present

## 2023-02-12 DIAGNOSIS — N179 Acute kidney failure, unspecified: Secondary | ICD-10-CM | POA: Diagnosis not present

## 2023-02-12 DIAGNOSIS — Z794 Long term (current) use of insulin: Secondary | ICD-10-CM

## 2023-02-12 DIAGNOSIS — D631 Anemia in chronic kidney disease: Secondary | ICD-10-CM | POA: Diagnosis present

## 2023-02-12 DIAGNOSIS — Z83438 Family history of other disorder of lipoprotein metabolism and other lipidemia: Secondary | ICD-10-CM

## 2023-02-12 DIAGNOSIS — N184 Chronic kidney disease, stage 4 (severe): Secondary | ICD-10-CM | POA: Diagnosis present

## 2023-02-12 DIAGNOSIS — R112 Nausea with vomiting, unspecified: Secondary | ICD-10-CM | POA: Diagnosis not present

## 2023-02-12 DIAGNOSIS — R079 Chest pain, unspecified: Secondary | ICD-10-CM | POA: Diagnosis not present

## 2023-02-12 DIAGNOSIS — T508X5A Adverse effect of diagnostic agents, initial encounter: Secondary | ICD-10-CM | POA: Diagnosis not present

## 2023-02-12 DIAGNOSIS — I340 Nonrheumatic mitral (valve) insufficiency: Secondary | ICD-10-CM | POA: Diagnosis not present

## 2023-02-12 DIAGNOSIS — Z7982 Long term (current) use of aspirin: Secondary | ICD-10-CM

## 2023-02-12 DIAGNOSIS — E1165 Type 2 diabetes mellitus with hyperglycemia: Secondary | ICD-10-CM | POA: Diagnosis not present

## 2023-02-12 DIAGNOSIS — E1159 Type 2 diabetes mellitus with other circulatory complications: Secondary | ICD-10-CM | POA: Diagnosis not present

## 2023-02-12 DIAGNOSIS — I480 Paroxysmal atrial fibrillation: Secondary | ICD-10-CM | POA: Diagnosis not present

## 2023-02-12 DIAGNOSIS — F1120 Opioid dependence, uncomplicated: Secondary | ICD-10-CM | POA: Diagnosis not present

## 2023-02-12 DIAGNOSIS — Z89412 Acquired absence of left great toe: Secondary | ICD-10-CM

## 2023-02-12 DIAGNOSIS — Z888 Allergy status to other drugs, medicaments and biological substances status: Secondary | ICD-10-CM

## 2023-02-12 DIAGNOSIS — Z886 Allergy status to analgesic agent status: Secondary | ICD-10-CM

## 2023-02-12 DIAGNOSIS — J811 Chronic pulmonary edema: Secondary | ICD-10-CM | POA: Diagnosis not present

## 2023-02-12 DIAGNOSIS — Z6839 Body mass index (BMI) 39.0-39.9, adult: Secondary | ICD-10-CM

## 2023-02-12 DIAGNOSIS — I517 Cardiomegaly: Secondary | ICD-10-CM | POA: Diagnosis not present

## 2023-02-12 DIAGNOSIS — I214 Non-ST elevation (NSTEMI) myocardial infarction: Principal | ICD-10-CM | POA: Diagnosis present

## 2023-02-12 DIAGNOSIS — Z5986 Financial insecurity: Secondary | ICD-10-CM

## 2023-02-12 DIAGNOSIS — I5042 Chronic combined systolic (congestive) and diastolic (congestive) heart failure: Secondary | ICD-10-CM | POA: Diagnosis present

## 2023-02-12 DIAGNOSIS — Z89422 Acquired absence of other left toe(s): Secondary | ICD-10-CM

## 2023-02-12 DIAGNOSIS — Z833 Family history of diabetes mellitus: Secondary | ICD-10-CM

## 2023-02-12 DIAGNOSIS — Z9049 Acquired absence of other specified parts of digestive tract: Secondary | ICD-10-CM

## 2023-02-12 LAB — CBC
HCT: 33 % — ABNORMAL LOW (ref 36.0–46.0)
Hemoglobin: 10.4 g/dL — ABNORMAL LOW (ref 12.0–15.0)
MCH: 27.2 pg (ref 26.0–34.0)
MCHC: 31.5 g/dL (ref 30.0–36.0)
MCV: 86.4 fL (ref 80.0–100.0)
Platelets: 243 10*3/uL (ref 150–400)
RBC: 3.82 MIL/uL — ABNORMAL LOW (ref 3.87–5.11)
RDW: 16.3 % — ABNORMAL HIGH (ref 11.5–15.5)
WBC: 15.5 10*3/uL — ABNORMAL HIGH (ref 4.0–10.5)
nRBC: 0 % (ref 0.0–0.2)

## 2023-02-12 LAB — CBG MONITORING, ED
Glucose-Capillary: 442 mg/dL — ABNORMAL HIGH (ref 70–99)
Glucose-Capillary: 354 mg/dL — ABNORMAL HIGH (ref 70–99)

## 2023-02-12 LAB — BASIC METABOLIC PANEL
Anion gap: 14 (ref 5–15)
BUN: 58 mg/dL — ABNORMAL HIGH (ref 6–20)
CO2: 21 mmol/L — ABNORMAL LOW (ref 22–32)
Calcium: 8.8 mg/dL — ABNORMAL LOW (ref 8.9–10.3)
Chloride: 100 mmol/L (ref 98–111)
Creatinine, Ser: 1.95 mg/dL — ABNORMAL HIGH (ref 0.44–1.00)
GFR, Estimated: 29 mL/min — ABNORMAL LOW (ref >60)
Glucose, Bld: 456 mg/dL — ABNORMAL HIGH (ref 70–99)
Potassium: 3.8 mmol/L (ref 3.5–5.1)
Sodium: 135 mmol/L (ref 135–145)

## 2023-02-12 LAB — BETA-HYDROXYBUTYRIC ACID: Beta-Hydroxybutyric Acid: 0.23 mmol/L (ref 0.05–0.27)

## 2023-02-12 LAB — TROPONIN I (HIGH SENSITIVITY)
Troponin I (High Sensitivity): 107 ng/L (ref <18)
Troponin I (High Sensitivity): 941 ng/L (ref <18)

## 2023-02-12 MED ORDER — LORAZEPAM 0.5 MG PO TABS
0.5000 mg | ORAL_TABLET | Freq: Every day | ORAL | Status: DC | PRN
  Administered 2023-02-13 – 2023-02-17 (×4): 0.5 mg via ORAL
  Filled 2023-02-12 (×4): qty 1

## 2023-02-12 MED ORDER — HEPARIN (PORCINE) 25000 UT/250ML-% IV SOLN
1400.0000 [IU]/h | INTRAVENOUS | Status: DC
  Administered 2023-02-12: 950 [IU]/h via INTRAVENOUS
  Filled 2023-02-12: qty 250

## 2023-02-12 MED ORDER — HYDROCODONE-ACETAMINOPHEN 7.5-325 MG PO TABS
1.0000 | ORAL_TABLET | Freq: Three times a day (TID) | ORAL | Status: DC | PRN
  Administered 2023-02-12 – 2023-02-20 (×19): 1 via ORAL
  Filled 2023-02-12 (×19): qty 1

## 2023-02-12 MED ORDER — ALLOPURINOL 100 MG PO TABS
100.0000 mg | ORAL_TABLET | Freq: Every day | ORAL | Status: DC
  Administered 2023-02-13 – 2023-02-20 (×8): 100 mg via ORAL
  Filled 2023-02-12 (×8): qty 1

## 2023-02-12 MED ORDER — INSULIN ASPART 100 UNIT/ML IJ SOLN
8.0000 [IU] | Freq: Once | INTRAMUSCULAR | Status: AC
  Administered 2023-02-12: 8 [IU] via INTRAVENOUS

## 2023-02-12 MED ORDER — NITROGLYCERIN IN D5W 200-5 MCG/ML-% IV SOLN
2.0000 ug/min | INTRAVENOUS | Status: DC
  Administered 2023-02-12: 5 ug/min via INTRAVENOUS
  Filled 2023-02-12: qty 250

## 2023-02-12 MED ORDER — ASPIRIN 81 MG PO TBEC
81.0000 mg | DELAYED_RELEASE_TABLET | Freq: Every day | ORAL | Status: DC
  Administered 2023-02-13 – 2023-02-20 (×8): 81 mg via ORAL
  Filled 2023-02-12 (×8): qty 1

## 2023-02-12 MED ORDER — INSULIN GLARGINE-YFGN 100 UNIT/ML ~~LOC~~ SOLN
25.0000 [IU] | Freq: Every day | SUBCUTANEOUS | Status: DC
  Administered 2023-02-13: 25 [IU] via SUBCUTANEOUS
  Filled 2023-02-12 (×2): qty 0.25

## 2023-02-12 MED ORDER — LINACLOTIDE 145 MCG PO CAPS
145.0000 ug | ORAL_CAPSULE | Freq: Every day | ORAL | Status: DC
  Administered 2023-02-18 – 2023-02-20 (×3): 145 ug via ORAL
  Filled 2023-02-12 (×7): qty 1

## 2023-02-12 MED ORDER — HEPARIN BOLUS VIA INFUSION
4000.0000 [IU] | Freq: Once | INTRAVENOUS | Status: AC
  Administered 2023-02-12: 4000 [IU] via INTRAVENOUS
  Filled 2023-02-12: qty 4000

## 2023-02-12 MED ORDER — ACETAMINOPHEN 325 MG PO TABS
650.0000 mg | ORAL_TABLET | ORAL | Status: DC | PRN
  Administered 2023-02-13 – 2023-02-19 (×2): 650 mg via ORAL
  Filled 2023-02-12 (×2): qty 2

## 2023-02-12 MED ORDER — INSULIN ASPART 100 UNIT/ML IJ SOLN
0.0000 [IU] | INTRAMUSCULAR | Status: DC
  Administered 2023-02-12: 5 [IU] via SUBCUTANEOUS
  Administered 2023-02-13 (×2): 2 [IU] via SUBCUTANEOUS
  Administered 2023-02-13 (×2): 4 [IU] via SUBCUTANEOUS
  Administered 2023-02-13: 1 [IU] via SUBCUTANEOUS
  Administered 2023-02-13: 3 [IU] via SUBCUTANEOUS
  Administered 2023-02-14: 1 [IU] via SUBCUTANEOUS
  Administered 2023-02-14: 2 [IU] via SUBCUTANEOUS
  Administered 2023-02-14: 3 [IU] via SUBCUTANEOUS
  Administered 2023-02-14: 1 [IU] via SUBCUTANEOUS
  Administered 2023-02-14: 4 [IU] via SUBCUTANEOUS
  Administered 2023-02-14: 3 [IU] via SUBCUTANEOUS
  Administered 2023-02-15 (×3): 2 [IU] via SUBCUTANEOUS
  Administered 2023-02-15 (×2): 1 [IU] via SUBCUTANEOUS
  Administered 2023-02-16: 2 [IU] via SUBCUTANEOUS
  Administered 2023-02-16: 3 [IU] via SUBCUTANEOUS
  Administered 2023-02-16: 2 [IU] via SUBCUTANEOUS
  Administered 2023-02-17: 3 [IU] via SUBCUTANEOUS
  Administered 2023-02-17 – 2023-02-18 (×2): 2 [IU] via SUBCUTANEOUS
  Administered 2023-02-18 (×2): 1 [IU] via SUBCUTANEOUS
  Administered 2023-02-18 (×2): 2 [IU] via SUBCUTANEOUS
  Administered 2023-02-19 (×3): 1 [IU] via SUBCUTANEOUS

## 2023-02-12 MED ORDER — POLYETHYLENE GLYCOL 3350 17 G PO PACK
17.0000 g | PACK | Freq: Every day | ORAL | Status: DC
  Administered 2023-02-18: 17 g via ORAL
  Filled 2023-02-12 (×6): qty 1

## 2023-02-12 MED ORDER — BUSPIRONE HCL 10 MG PO TABS
10.0000 mg | ORAL_TABLET | Freq: Three times a day (TID) | ORAL | Status: DC
  Administered 2023-02-12 – 2023-02-20 (×22): 10 mg via ORAL
  Filled 2023-02-12 (×22): qty 1

## 2023-02-12 MED ORDER — LACTATED RINGERS IV SOLN: INTRAVENOUS | Status: DC

## 2023-02-12 MED ORDER — CARVEDILOL 12.5 MG PO TABS
12.5000 mg | ORAL_TABLET | Freq: Two times a day (BID) | ORAL | Status: DC
  Administered 2023-02-12 – 2023-02-20 (×15): 12.5 mg via ORAL
  Filled 2023-02-12 (×15): qty 1

## 2023-02-12 MED ORDER — LATANOPROST 0.005 % OP SOLN
1.0000 [drp] | Freq: Every day | OPHTHALMIC | Status: DC
  Administered 2023-02-13 – 2023-02-19 (×8): 1 [drp] via OPHTHALMIC
  Filled 2023-02-12: qty 2.5

## 2023-02-12 MED ORDER — TICAGRELOR 90 MG PO TABS
90.0000 mg | ORAL_TABLET | Freq: Two times a day (BID) | ORAL | Status: DC
  Administered 2023-02-12 – 2023-02-20 (×16): 90 mg via ORAL
  Filled 2023-02-12 (×16): qty 1

## 2023-02-12 MED ORDER — ONDANSETRON HCL 4 MG/2ML IJ SOLN
4.0000 mg | Freq: Four times a day (QID) | INTRAMUSCULAR | Status: DC | PRN
  Administered 2023-02-14 – 2023-02-16 (×3): 4 mg via INTRAVENOUS
  Filled 2023-02-12 (×3): qty 2

## 2023-02-12 MED ORDER — CEPHALEXIN 250 MG PO CAPS
250.0000 mg | ORAL_CAPSULE | Freq: Every day | ORAL | Status: DC
  Administered 2023-02-12 – 2023-02-19 (×8): 250 mg via ORAL
  Filled 2023-02-12 (×9): qty 1

## 2023-02-12 MED ORDER — PANTOPRAZOLE SODIUM 40 MG PO TBEC
40.0000 mg | DELAYED_RELEASE_TABLET | Freq: Every day | ORAL | Status: DC
  Administered 2023-02-13 – 2023-02-20 (×8): 40 mg via ORAL
  Filled 2023-02-12 (×8): qty 1

## 2023-02-12 NOTE — Consult Note (Signed)
Cardiology Consultation   Patient ID: [name] MRN: [id number]; DOB: 01-02-1961  Admit date: 02/12/2023 Date of Consult: 02/12/2023  PCP:  Blane Ohara, MD   [facility] Providers Cardiologist:  Arvilla Meres, MD        Patient Profile:   Dana [name] is a 62 y.o. female who is being seen 02/12/2023 for the evaluation of chest pain at the request of Dr. Particia Nearing.  History of Present Illness:   Dana [name] has a very complex medical and cardiac history.  She has undergone multiple PCI procedures in the past with a variety of complications including contrast nephropathy with severe kidney injury, bleeding, and a history of PEA arrest.  She presents to the emergency room today with progressive chest discomfort over the past few days.  Even with increasing her isosorbide, her symptoms have worsened.  She is also been using sublingual nitroglycerin at home.  Comorbidities include chronic heart failure with preserved ejection fraction.  The patient has a CardioMEMS device.  She has secondary mitral regurgitation and has undergone transcatheter edge-to-edge repair with MitraClip with mild to moderate residual MR.  She is followed regularly in the heart failure clinic.  The patient's daughter and husband are present at the bedside in the emergency department.  She has had progressive angina over the past few days.  Today, she took a short walk from her car and developed substernal chest pressure.  She took a total of 3 nitroglycerin and called EMS with persistent symptoms.  She received another nitroglycerin and route and her chest discomfort has eased.  She continues to complain of a mild discomfort in the chest and left arm.  There is no associated shortness of breath.  The patient has chronic orthopnea that is unchanged.  She has chronic leg edema that is also unchanged.  She does not feel like she is experiencing any heart failure related symptoms at this time.  Her isosorbide  was just increased yesterday, but even with this her chest pain got much worse today.  Her initial high-sensitivity troponin is 107 and follow-up high-sensitivity troponin is 941.  EKG shows no acute ischemic changes.   Past Medical History:  Diagnosis Date   Acquired absence of left great toe (HCC)    Acquired absence of other left toe(s) (HCC)    ACS (acute coronary syndrome) (HCC)    Anemia    Anoxic brain injury (HCC)    Anxiety    CAD (coronary artery disease)    DES mLAD 04/07/15; DES dRCA & DES pPDA 08/30/16; DES mCX & PTCA OM1 09/29/20   Chronic diastolic (congestive) heart failure (HCC)    Chronic pain    Chronic systolic (congestive) heart failure (HCC)    CKD (chronic kidney disease)    Contrast dye induced nephropathy, possible 09/03/2020   COPD (chronic obstructive pulmonary disease) (HCC)    Diabetes (HCC)type 1    Diastolic CHF (HCC)    Dyspnea    Family history of breast cancer 10/23/2021   Gastro-esophageal reflux disease without esophagitis    Hyperlipidemia    Hypertension    Hypertensive heart disease with heart failure (HCC)    Hypoxemia    Idiopathic gout, multiple sites    Leukocytosis 03/29/2022   Localized edema    Low back pain    Mitral regurgitation    Mitral regurgitation    Mixed hyperlipidemia    Mixed incontinence    Morbid (severe) obesity due to excess calories (HCC)  Neuromuscular disorder (HCC)    Neuropathy    Non-ST elevation (NSTEMI) myocardial infarction (HCC)    08/29/20, 09/29/20   OSA (obstructive sleep apnea) 09/03/2020   Other specified hypothyroidism    PAF (paroxysmal atrial fibrillation) (HCC)    s/p pulmonary vein isolation by cryoablation 10/04/15 Dr. Rudolpho Sevin in [facility]   PEA (Pulseless electrical activity) [facility])    ~ 01/13/21 at [facility] during admission DKA, volume overload, possible CAP, hypoxia s/p ACLS with ROSC ~ 10-15   Pneumonia    Primary insomnia    Rheumatoid arthritis (HCC)    Stroke (HCC)    Thyroid  disease    Type 1 diabetes mellitus (HCC)    Vitamin D deficiency     Past Surgical History:  Procedure Laterality Date   ABDOMINAL HYSTERECTOMY     Partial- Still has both ovaries   CARDIOVASCULAR STRESS TEST  08/13/2014   Nuclear; Normal   CARPAL TUNNEL RELEASE     CATARACT EXTRACTION, BILATERAL     CESAREAN SECTION     CHOLECYSTECTOMY     CORONARY ANGIOPLASTY WITH STENT PLACEMENT     3 blockages/ 1 stent   CORONARY PRESSURE/FFR STUDY N/A 09/07/2022   Procedure: INTRAVASCULAR PRESSURE WIRE/FFR STUDY;  Surgeon: Swaziland, Peter M, MD;  Location: [facility] INVASIVE CV LAB;  Service: Cardiovascular;  Laterality: N/A;   CORONARY STENT INTERVENTION N/A 09/29/2020   Procedure: CORONARY STENT INTERVENTION;  Surgeon: Tonny Bollman, MD;  Location: [facility] INVASIVE CV LAB;  Service: Cardiovascular;  Laterality: N/A;  CFX   CORONARY STENT INTERVENTION N/A 09/07/2022   Procedure: CORONARY STENT INTERVENTION;  Surgeon: Swaziland, Peter M, MD;  Location: [facility] INVASIVE CV LAB;  Service: Cardiovascular;  Laterality: N/A;   CORONARY/GRAFT ACUTE MI REVASCULARIZATION N/A 12/08/2022   Procedure: Coronary/Graft Acute MI Revascularization;  Surgeon: Marykay Lex, MD;  Location: [facility] INVASIVE CV LAB;  Service: Cardiovascular;  Laterality: N/A;   EYE SURGERY     LEFT HEART CATH AND CORONARY ANGIOGRAPHY N/A 09/29/2020   Procedure: LEFT HEART CATH AND CORONARY ANGIOGRAPHY;  Surgeon: Tonny Bollman, MD;  Location: [facility] INVASIVE CV LAB;  Service: Cardiovascular;  Laterality: N/A;   LEFT HEART CATH AND CORONARY ANGIOGRAPHY N/A 09/07/2022   Procedure: LEFT HEART CATH AND CORONARY ANGIOGRAPHY;  Surgeon: Swaziland, Peter M, MD;  Location: [facility] INVASIVE CV LAB;  Service: Cardiovascular;  Laterality: N/A;   LEFT HEART CATH AND CORONARY ANGIOGRAPHY N/A 12/08/2022   Procedure: LEFT HEART CATH AND CORONARY ANGIOGRAPHY;  Surgeon: Marykay Lex, MD;  Location: [facility] INVASIVE CV LAB;  Service: Cardiovascular;  Laterality: N/A;   MITRAL VALVE REPAIR  N/A 05/18/2021   Procedure: MITRAL VALVE REPAIR;  Surgeon: Orbie Pyo, MD;  Location: [facility] INVASIVE CV LAB;  Service: Cardiovascular;  Laterality: N/A;   RIGHT HEART CATH N/A 04/27/2021   Procedure: RIGHT HEART CATH;  Surgeon: Dolores Patty, MD;  Location: [facility] INVASIVE CV LAB;  Service: Cardiovascular;  Laterality: N/A;   RIGHT/LEFT HEART CATH AND CORONARY ANGIOGRAPHY N/A 01/07/2017   Procedure: Right/Left Heart Cath and Coronary Angiography;  Surgeon: Laurey Morale, MD;  Location: [facility] INVASIVE CV LAB;  Service: Cardiovascular;  Laterality: N/A;   ROTATOR CUFF REPAIR     TEE WITHOUT CARDIOVERSION N/A 04/28/2021   Procedure: TRANSESOPHAGEAL ECHOCARDIOGRAM (TEE);  Surgeon: Dolores Patty, MD;  Location: [facility] ENDOSCOPY;  Service: Cardiovascular;  Laterality: N/A;   TEE WITHOUT CARDIOVERSION N/A 05/18/2021   Procedure: TRANSESOPHAGEAL ECHOCARDIOGRAM (TEE);  Surgeon: Orbie Pyo, MD;  Location:  [facility] INVASIVE CV LAB;  Service: Cardiovascular;  Laterality: N/A;   TOE AMPUTATION Left    US ECHOCARDIOGRAPHY  05/2017   Normal     Home Medications:  Prior to Admission medications   Medication Sig Start Date End Date Taking? Authorizing Provider  allopurinol (ZYLOPRIM) 300 MG tablet TAKE ONE TABLET BY MOUTH ONCE DAILY 01/03/23   Cox, Fritzi Mandes, MD  aspirin EC 81 MG tablet Take 1 tablet (81 mg total) by mouth daily. Swallow whole. 09/09/22   Cyndi Bender, NP  busPIRone (BUSPAR) 10 MG tablet Take 1 tablet (10 mg total) by mouth 3 (three) times daily. 12/17/22   Cox, Fritzi Mandes, MD  carvedilol (COREG) 12.5 MG tablet TAKE ONE TABLET BY MOUTH twice daily 01/03/23   Bensimhon, Bevelyn Buckles, MD  cephALEXin (KEFLEX) 250 MG capsule Take 250 mg by mouth at bedtime. 11/30/22   [provider]  Continuous Blood Gluc Sensor (FREESTYLE LIBRE 2 SENSOR) MISC USE TO check blood glucose AS DIRECTED AND CHANGE sensor every 14 DAYS 01/11/22   [provider]  CRANBERRY PO Take 2 tablets by mouth daily.     [provider]  EPINEPHrine 0.3 mg/0.3 mL IJ SOAJ injection Use as directed for life-threatening allergic reaction. Patient not taking: Reported on 12/25/2022 06/24/18   Marcelyn Bruins, MD  estradiol (ESTRACE) 0.1 MG/GM vaginal cream Place 1 Applicatorful vaginally every other day. Patient not taking: Reported on 12/25/2022 11/27/22   [provider]  ezetimibe (ZETIA) 10 MG tablet Take 1 tablet (10 mg total) by mouth daily. 11/05/22   Cox, Fritzi Mandes, MD  HYDROcodone-acetaminophen (NORCO) 7.5-325 MG tablet Take 1 tablet by mouth 3 (three) times daily as needed. 02/01/23   Cox, Fritzi Mandes, MD  insulin aspart (NOVOLOG FLEXPEN) 100 UNIT/ML FlexPen 2-10 U before meals based on SSI. 10/09/22   Cox, Kirsten, MD  Insulin Pen Needle (BD PEN NEEDLE NANO 2ND GEN) 32G X 4 MM MISC Inject 1 each into the skin in the morning, at noon, in the evening, and at bedtime. 08/30/21   CoxFritzi Mandes, MD  isosorbide mononitrate (IMDUR) 60 MG 24 hr tablet Take 1 tablet (60 mg total) by mouth daily. 12/25/22   Milford, Anderson Malta, FNP  latanoprost (XALATAN) 0.005 % ophthalmic solution Place 1 drop into both eyes at bedtime.    [provider]  LEVEMIR FLEXPEN 100 UNIT/ML FlexPen Inject 50 units into THE SKIN daily 01/13/23   Cox, Fritzi Mandes, MD  linaclotide [facility]) 145 MCG CAPS capsule TAKE ONE CAPSULE BY MOUTH BEFORE BREAKFAST 01/03/23   Cox, Fritzi Mandes, MD  LORazepam (ATIVAN) 0.5 MG tablet TAKE ONE TABLET BY MOUTH ONCE daily AS NEEDED FOR ANXIETY 01/23/23   Cox, Kirsten, MD  magnesium oxide (MAG-OX) 400 MG tablet Take 1 tablet (400 mg total) by mouth daily. Take 2 (400 mg) daily=800 mg 12/16/22   Rolly Salter, MD  nitroGLYCERIN (NITROSTAT) 0.4 MG SL tablet DISSOLVE ONE TABLET UNDER THE TONGUE EVERY 5 MINUTES AS NEEDED FOR CHEST PAIN.  DO NOT EXCEED A TOTAL OF 3 DOSES IN 15 MINUTES 01/21/23   Bensimhon, Bevelyn Buckles, MD  Omega-3 Fatty Acids (FISH OIL PO) Take 1 capsule by mouth daily.    [provider]   OXYGEN Inhale 2 L into the lungs at bedtime.    [provider]  pantoprazole (PROTONIX) 40 MG tablet TAKE ONE TABLET BY MOUTH ONCE DAILY 01/03/23   Cox, Kirsten, MD  polyethylene glycol (MIRALAX / GLYCOLAX) 17 g packet Take 17 g by mouth  daily. 12/17/22   Rolly Salter, MD  potassium chloride SA (KLOR-CON M) 20 MEQ tablet TAKE ONE TABLET BY MOUTH ONCE DAILY 01/03/23   CoxFritzi Mandes, MD  Probiotic CAPS Take 1 capsule by mouth daily.    [provider]  promethazine (PHENERGAN) 25 MG tablet Take 1 tablet (25 mg total) by mouth daily as needed. Patient taking differently: Take 25 mg by mouth daily as needed for nausea or vomiting. 10/22/22   Blane Ohara, MD  REPATHA SURECLICK 140 MG/ML SOAJ inject 140mg  intot HEART SKIN every 14 DAYS 12/28/22   Bensimhon, Bevelyn Buckles, MD  ticagrelor (BRILINTA) 90 MG TABS tablet Take 1 tablet (90 mg total) by mouth 2 (two) times daily. 12/25/22   Jacklynn Ganong, FNP  torsemide (DEMADEX) 20 MG tablet TAKE 4 TABLETS BY MOUTH ONCE daily 01/03/23   Jacklynn Ganong, FNP    Inpatient Medications: Scheduled Meds:   Continuous Infusions:  PRN Meds:   Allergies:    Allergies  Allergen Reactions   Naproxen Hives and Rash   Shellfish-Derived Products Hives, Swelling and Other (See Comments)    Angioedema    Strawberry (Diagnostic) Hives   Celecoxib Other (See Comments)    Stomach pain   Glucosamine Hives, Swelling and Other (See Comments)    Angioedema    Iron     IV iron transfusion - hives - Feb 2022 hospitalization   Metoclopramide Other (See Comments)    Facial twitching and stuttering   Metolazone Other (See Comments)    Acute renal failure   Statins Other (See Comments)    Muscle pain    Social History:   Social History   Socioeconomic History   Marital status: Married    Spouse name: [name]   Number of children: 2   Years of education: 16   Highest education level: Master's degree (e.g., MA, MS, MEng, MEd, MSW, MBA)   Occupational History   Occupation: on disability/retired early former Engineer, site  Tobacco Use   Smoking status: Former    Current packs/day: 0.00    Types: Cigarettes    Start date: 31    Quit date: 1984    Years since quitting: 40.6    Passive exposure: Past   Smokeless tobacco: Never  Vaping Use   Vaping status: Never Used  Substance and Sexual Activity   Alcohol use: Never   Drug use: Never   Sexual activity: Not on file  Other Topics Concern   Not on file  Social History Narrative   Not on file   Social Determinants of Health   Financial Resource Strain: Medium Risk (10/30/2022)   Overall Financial Resource Strain (CARDIA)    Difficulty of Paying Living Expenses: Somewhat hard  Food Insecurity: Low Risk  (01/11/2023)   [facility]   Food vital sign    Within the past 12 months, you worried that your food would run out before you got money to buy more: Never true    Within the past 12 months, the food you bought just didn't last and you didn't have money to get more. : Never true  Transportation Needs: No Transportation Needs (01/30/2023)   PRAPARE - Administrator, Civil Service (Medical): No    Lack of Transportation (Non-Medical): No  Physical Activity: Inactive (02/05/2023)   Exercise Vital Sign    Days of Exercise per Week: 0 days    Minutes of Exercise per Session: 0 min  Stress:  Stress Concern Present (02/05/2023)   [employer] of Occupational Health - Occupational Stress Questionnaire    Feeling of Stress : To some extent  Social Connections: Moderately Integrated (07/25/2022)   Social Connection and Isolation Panel [NHANES]    Frequency of Communication with Friends and Family: More than three times a week    Frequency of Social Gatherings with Friends and Family: More than three times a week    Attends Religious Services: More than 4 times per year    Active Member of [employer] or Organizations: No    Attends  Banker Meetings: Never    Marital Status: Married  Catering manager Violence: Not At Risk (12/06/2022)   Humiliation, Afraid, Rape, and Kick questionnaire    Fear of Current or Ex-Partner: No    Emotionally Abused: No    Physically Abused: No    Sexually Abused: No    Family History:   Family History  Problem Relation Age of Onset   Breast cancer Mother 40       contralateral breast ca dx 68   Coronary artery disease Mother    Hypertension Mother    Hyperlipidemia Mother    Diabetes type II Mother    Lung cancer Mother 48   Diabetes Father    Hypertension Father    Mental illness Sister    Bipolar disorder Other    Depression Other    Schizophrenia Other    Cancer Other        MGF's sisters x2; unknown type     ROS:  Please see the history of present illness.  All other ROS reviewed and negative.     Physical Exam/Data:   Vitals:   02/12/23 1452 02/12/23 1453 02/12/23 1455 02/12/23 1700  BP:  (!) 158/72  138/63  Pulse:  96  85  Resp:  16  14  Temp:  97.7 F (36.5 C)    TempSrc:  Oral    SpO2: 94% 96%  96%  Weight:   99.3 kg   Height:   5' 4.5" (1.638 m)    No intake or output data in the 24 hours ending 02/12/23 1835    02/12/2023    2:55 PM 12/25/2022    9:56 AM 12/17/2022    3:46 PM  Last 3 Weights  Weight (lbs) 219 lb 229 lb 229 lb  Weight (kg) 99.338 kg 103.874 kg 103.874 kg     Body mass index is 37.01 kg/m.  General:  Well nourished, well developed, in no acute distress HEENT: normal Neck: no JVD Vascular: No carotid bruits; Distal pulses 2+ bilaterally Cardiac:  normal S1, S2; RRR; no murmur  Lungs:  clear to auscultation bilaterally, no wheezing, rhonchi or rales  Abd: soft, nontender, no hepatomegaly  Ext: no edema Musculoskeletal:  No deformities, BUE and BLE strength normal and equal Skin: warm and dry  Neuro:  CNs 2-12 intact, no focal abnormalities noted Psych:  Normal affect   EKG:  The EKG was personally reviewed and  demonstrates: Normal sinus rhythm 94 bpm, rightward axis, cannot rule out anterior infarct age undetermined, nonspecific ST abnormality  Relevant CV Studies: Cardiac catheterization films 12/08/2022 personally reviewed  Laboratory Data:  High Sensitivity Troponin:   Recent Labs  Lab 02/12/23 1501  TROPONINIHS 107*     Chemistry Recent Labs  Lab 02/12/23 1501  NA 135  K 3.8  CL 100  CO2 21*  GLUCOSE 456*  BUN 58*  CREATININE 1.95*  CALCIUM 8.8*  GFRNONAA 29*  ANIONGAP 14    No results for input(s): "PROT", "ALBUMIN", "AST", "ALT", "ALKPHOS", "BILITOT" in the last 168 hours. Lipids No results for input(s): "CHOL", "TRIG", "HDL", "LABVLDL", "LDLCALC", "CHOLHDL" in the last 168 hours.  Hematology Recent Labs  Lab 02/12/23 1501  WBC 15.5*  RBC 3.82*  HGB 10.4*  HCT 33.0*  MCV 86.4  MCH 27.2  MCHC 31.5  RDW 16.3*  PLT 243   Thyroid No results for input(s): "TSH", "FREET4" in the last 168 hours.  BNPNo results for input(s): "BNP", "PROBNP" in the last 168 hours.  DDimer No results for input(s): "DDIMER" in the last 168 hours.   Radiology/Studies:  DG Chest 2 View  Result Date: 02/12/2023 CLINICAL DATA:  Chest pain, shortness of breath, and pain radiating down left arm. History of CHF. EXAM: CHEST - 2 VIEW COMPARISON:  Chest radiographs 12/08/2022, 12/06/2022, 09/07/2022, 05/16/2021, 10/03/2020 FINDINGS: Cardiac silhouette is again moderately markedly enlarged. Mediastinal contours within normal limits. Mild-to-moderate bilateral interstitial thickening, greatest within the inferior lungs is not significantly changed from 05/16/2021. This is improved from most recent 12/08/2022 radiograph. Mitral valve clips and CardioMEMS left pulmonary arterial device are unchanged in position. The posterior costophrenic angles are sharp on lateral view and no definite pleural effusion is seen. There is blunting of the bilateral costophrenic angles on frontal view likely due to chronic  interstitial opacification. No pneumothorax is seen. Mild dextrocurvature of the mid to upper thoracic spine with moderate multilevel disc space narrowing. IMPRESSION: 1. Moderate cardiomegaly. 2. Mild-to-moderate bilateral interstitial thickening, greatest within the inferior lungs is not significantly changed from 05/16/2021. This is improved from most recent 12/08/2022 radiograph. This may represent the patient's normal baseline and some interstitial thickening/scarring. It may represent minimal interstitial pulmonary edema, although this would be the least interstitial pulmonary edema seen on numerous comparison radiographs. Electronically Signed   By: Neita Garnet M.D.   On: 02/12/2023 17:13     Assessment and Plan:   Non-STEMI: Patient with extensive coronary artery disease, now presenting with crescendo angina despite titration of her outpatient antianginal therapies.  She has been compliant with aspirin and ticagrelor and is treated with isosorbide and carvedilol for antianginal therapy.  I reviewed her recent cath films from May of this year.  At that time she had severe in-stent restenosis in the proximal LAD.  This was treated with scoring balloon angioplasty.  At that time, she was also noted to have severe ostial RCA stenosis.  However, further cardiac catheterization and intervention was deferred because of her markedly increased risk of kidney injury.  She has had AKI multiple times associated with contrast administration from cardiac catheterization.  Her creatinine rose to 4.25 at the time of her last intervention.  I think she is at high risk of ischemic complications with medical therapy and her symptoms seem to be refractory to medical therapy.  While there is an increased risk of contrast nephropathy, it seems appropriate to proceed with limited contrast cardiac catheterization and possible PCI.  I think the most likely culprit lesion could be recurrent restenosis in the LAD, but she  understands that definitive catheterization will define her coronary anatomy.  I have reviewed the risks, indications, and alternatives to cardiac catheterization, possible angioplasty, and stenting with the patient. Risks include but are not limited to bleeding, infection, vascular injury, stroke, myocardial infection, arrhythmia, kidney injury, radiation-related injury in the case of prolonged fluoroscopy use, emergency cardiac surgery, and death. The patient understands the risks of  serious complication is 1-2 in 1000 with diagnostic cardiac cath and 1-2% or less with angioplasty/stenting.  Will start her on IV heparin and IV nitroglycerin for treatment of non-STEMI.  Repeat metabolic panel tomorrow morning.  Start gentle fluids tomorrow morning. Chronic diastolic heart failure: Appears clinically stable.  NYHA functional class III symptoms.  Hold torsemide tomorrow morning for cardiac catheterization. Chronic kidney disease stage IIIb: Discussion as above.  Avoid nephrotoxins.  Minimize contrast with cardiac catheterization is much as possible. Moderate mitral regurgitation status post transcatheter edge-to-edge repair of the mitral valve.  Stable on most recent echo study.  Mean transvalvular gradient 5 mmHg. Type 1 diabetes: Patient on insulin.  Management per primary team.   Risk Assessment/Risk Scores:     TIMI Risk Score for Unstable Angina or Non-ST Elevation MI:   The patient's TIMI risk score is 5, which indicates a 26% risk of all cause mortality, new or recurrent myocardial infarction or need for urgent revascularization in the next 14 days.  New York Heart Association (NYHA) Functional Class NYHA Class III        For questions or updates, please contact [facility] Please consult [email or web address] for contact info under    Signed, Tonny Bollman, MD  02/12/2023 6:35 PM

## 2023-02-12 NOTE — ED Provider Notes (Signed)
[place] EMERGENCY DEPARTMENT AT [facility] Provider Note   CSN: [id number] Arrival date & time: 02/12/23  1447     History  Chief Complaint  Patient presents with   Chest Pain    Dana [name] is a 62 y.o. female.  Pt is a 62 yo female with pmhx significant for CHF, DM1, HTN, HLD, CAD, GERD, CKD, CHF, COPD, PAF s/p ablation and arthritis.  Pt has had several stents and a hx of PEA arrest.  She has been having cp for the past few days.  She called her cardiologist who recommended that she increase her imdur to 90 mg.  She's been doing that plus taking her nitroglycerin.  Cp improves, but it never goes away.  Pt forgot to take her short acting insulin this am.  EMS gave her 1 nitroglycerin and asa and 150 cc NS en route.  Cp is better, but not gone.       Home Medications Prior to Admission medications   Medication Sig Start Date End Date Taking? Authorizing Provider  allopurinol (ZYLOPRIM) 300 MG tablet TAKE ONE TABLET BY MOUTH ONCE DAILY 01/03/23  Yes Cox, Kirsten, MD  aspirin EC 81 MG tablet Take 1 tablet (81 mg total) by mouth daily. Swallow whole. 09/09/22  Yes Cyndi Bender, NP  busPIRone (BUSPAR) 10 MG tablet Take 1 tablet (10 mg total) by mouth 3 (three) times daily. 12/17/22  Yes Cox, Kirsten, MD  carvedilol (COREG) 12.5 MG tablet TAKE ONE TABLET BY MOUTH twice daily 01/03/23  Yes Bensimhon, Bevelyn Buckles, MD  cephALEXin (KEFLEX) 250 MG capsule Take 250 mg by mouth at bedtime. 11/30/22  Yes [provider]  CRANBERRY PO Take 2 tablets by mouth daily.   Yes [provider]  EPINEPHrine 0.3 mg/0.3 mL IJ SOAJ injection Use as directed for life-threatening allergic reaction. 06/24/18  Yes Padgett, Pilar Grammes, MD  ezetimibe (ZETIA) 10 MG tablet Take 1 tablet (10 mg total) by mouth daily. Patient taking differently: Take 10 mg by mouth every evening. 11/05/22  Yes Cox, Kirsten, MD  Continuous Blood Gluc Sensor (FREESTYLE LIBRE 2 SENSOR) MISC USE TO check  blood glucose AS DIRECTED AND CHANGE sensor every 14 DAYS 01/11/22   [provider]  HYDROcodone-acetaminophen (NORCO) 7.5-325 MG tablet Take 1 tablet by mouth 3 (three) times daily as needed. 02/01/23   Cox, Fritzi Mandes, MD  insulin aspart (NOVOLOG FLEXPEN) 100 UNIT/ML FlexPen 2-10 U before meals based on SSI. 10/09/22   Cox, Kirsten, MD  Insulin Pen Needle (BD PEN NEEDLE NANO 2ND GEN) 32G X 4 MM MISC Inject 1 each into the skin in the morning, at noon, in the evening, and at bedtime. 08/30/21   CoxFritzi Mandes, MD  isosorbide mononitrate (IMDUR) 60 MG 24 hr tablet Take 1 tablet (60 mg total) by mouth daily. 12/25/22   [place], Anderson Malta, FNP  latanoprost (XALATAN) 0.005 % ophthalmic solution Place 1 drop into both eyes at bedtime.    [provider]  LEVEMIR FLEXPEN 100 UNIT/ML FlexPen Inject 50 units into THE SKIN daily 01/13/23   Cox, Fritzi Mandes, MD  linaclotide [facility]) 145 MCG CAPS capsule TAKE ONE CAPSULE BY MOUTH BEFORE BREAKFAST 01/03/23   Cox, Fritzi Mandes, MD  LORazepam (ATIVAN) 0.5 MG tablet TAKE ONE TABLET BY MOUTH ONCE daily AS NEEDED FOR ANXIETY 01/23/23   Cox, Kirsten, MD  magnesium oxide (MAG-OX) 400 MG tablet Take 1 tablet (400 mg total) by mouth daily. Take 2 (400 mg) daily=800 mg 12/16/22  Rolly Salter, MD  nitroGLYCERIN (NITROSTAT) 0.4 MG SL tablet DISSOLVE ONE TABLET UNDER THE TONGUE EVERY 5 MINUTES AS NEEDED FOR CHEST PAIN.  DO NOT EXCEED A TOTAL OF 3 DOSES IN 15 MINUTES 01/21/23   Bensimhon, Bevelyn Buckles, MD  Omega-3 Fatty Acids (FISH OIL PO) Take 1 capsule by mouth daily.    [provider]  OXYGEN Inhale 2 L into the lungs at bedtime.    [provider]  pantoprazole (PROTONIX) 40 MG tablet TAKE ONE TABLET BY MOUTH ONCE DAILY 01/03/23   Cox, Kirsten, MD  polyethylene glycol (MIRALAX / GLYCOLAX) 17 g packet Take 17 g by mouth daily. 12/17/22   Rolly Salter, MD  potassium chloride SA (KLOR-CON M) 20 MEQ tablet TAKE ONE TABLET BY MOUTH ONCE DAILY 01/03/23   CoxFritzi Mandes, MD  Probiotic CAPS Take 1 capsule by mouth daily.    [provider]  promethazine (PHENERGAN) 25 MG tablet Take 1 tablet (25 mg total) by mouth daily as needed. Patient taking differently: Take 25 mg by mouth daily as needed for nausea or vomiting. 10/22/22   Blane Ohara, MD  REPATHA SURECLICK 140 MG/ML SOAJ inject 140mg  intot HEART SKIN every 14 DAYS 12/28/22   Bensimhon, Bevelyn Buckles, MD  ticagrelor (BRILINTA) 90 MG TABS tablet Take 1 tablet (90 mg total) by mouth 2 (two) times daily. 12/25/22   Jacklynn Ganong, FNP  torsemide (DEMADEX) 20 MG tablet TAKE 4 TABLETS BY MOUTH ONCE daily 01/03/23   Jacklynn Ganong, FNP      Allergies    Naproxen, Shellfish-derived products, Strawberry (diagnostic), Celecoxib, Glucosamine, Iron, Metoclopramide, Metolazone, and Statins    Review of Systems   Review of Systems  Cardiovascular:  Positive for chest pain.  All other systems reviewed and are negative.   Physical Exam Updated Vital Signs BP 138/63   Pulse 85   Temp 97.7 F (36.5 C) (Oral)   Resp 14   Ht 5' 4.5" (1.638 m)   Wt 99.3 kg   SpO2 96%   BMI 37.01 kg/m  Physical Exam Vitals and nursing note reviewed.  Constitutional:      Appearance: She is well-developed. She is obese.  HENT:     Head: Normocephalic and atraumatic.  Eyes:     Extraocular Movements: Extraocular movements intact.     Pupils: Pupils are equal, round, and reactive to light.  Cardiovascular:     Rate and Rhythm: Normal rate and regular rhythm.     Heart sounds: Normal heart sounds.  Pulmonary:     Effort: Pulmonary effort is normal.     Breath sounds: Normal breath sounds.  Abdominal:     General: Bowel sounds are normal.     Palpations: Abdomen is soft.  Musculoskeletal:        General: Normal range of motion.     Cervical back: Normal range of motion and neck supple.     Right lower leg: Edema present.     Left lower leg: Edema present.  Skin:    General: Skin is warm.      Capillary Refill: Capillary refill takes less than 2 seconds.  Neurological:     General: No focal deficit present.     Mental Status: She is alert and oriented to person, place, and time.  Psychiatric:        Mood and Affect: Mood normal.        Behavior: Behavior normal.     ED Results /  Procedures / Treatments   Labs (all labs ordered are listed, but only abnormal results are displayed) Labs Reviewed  BASIC METABOLIC PANEL - Abnormal; Notable for the following components:      Result Value   CO2 21 (*)    Glucose, Bld 456 (*)    BUN 58 (*)    Creatinine, Ser 1.95 (*)    Calcium 8.8 (*)    GFR, Estimated 29 (*)    All other components within normal limits  CBC - Abnormal; Notable for the following components:   WBC 15.5 (*)    RBC 3.82 (*)    Hemoglobin 10.4 (*)    HCT 33.0 (*)    RDW 16.3 (*)    All other components within normal limits  CBG MONITORING, ED - Abnormal; Notable for the following components:   Glucose-Capillary 442 (*)    All other components within normal limits  TROPONIN I (HIGH SENSITIVITY) - Abnormal; Notable for the following components:   Troponin I (High Sensitivity) 107 (*)    All other components within normal limits  TROPONIN I (HIGH SENSITIVITY) - Abnormal; Notable for the following components:   Troponin I (High Sensitivity) 941 (*)    All other components within normal limits  BETA-HYDROXYBUTYRIC ACID  I-STAT VENOUS BLOOD GAS, ED    EKG EKG Interpretation Date/Time:  Tuesday February 12 2023 15:04:24 EDT Ventricular Rate:  94 PR Interval:  170 QRS Duration:  86 QT Interval:  392 QTC Calculation: 490 R Axis:   91  Text Interpretation: Normal sinus rhythm Rightward axis Cannot rule out Anterior infarct , age undetermined Abnormal ECG When compared with ECG of 10-Dec-2022 22:11, PREVIOUS ECG IS PRESENT No significant change since last tracing Confirmed by Jacalyn Lefevre [id number]) on 02/12/2023 3:54:38 PM  Radiology DG Chest 2 View  Result  Date: 02/12/2023 CLINICAL DATA:  Chest pain, shortness of breath, and pain radiating down left arm. History of CHF. EXAM: CHEST - 2 VIEW COMPARISON:  Chest radiographs 12/08/2022, 12/06/2022, 09/07/2022, 05/16/2021, 10/03/2020 FINDINGS: Cardiac silhouette is again moderately markedly enlarged. Mediastinal contours within normal limits. Mild-to-moderate bilateral interstitial thickening, greatest within the inferior lungs is not significantly changed from 05/16/2021. This is improved from most recent 12/08/2022 radiograph. Mitral valve clips and [employer] left pulmonary arterial device are unchanged in position. The posterior costophrenic angles are sharp on lateral view and no definite pleural effusion is seen. There is blunting of the bilateral costophrenic angles on frontal view likely due to chronic interstitial opacification. No pneumothorax is seen. Mild dextrocurvature of the mid to upper thoracic spine with moderate multilevel disc space narrowing. IMPRESSION: 1. Moderate cardiomegaly. 2. Mild-to-moderate bilateral interstitial thickening, greatest within the inferior lungs is not significantly changed from 05/16/2021. This is improved from most recent 12/08/2022 radiograph. This may represent the patient's normal baseline and some interstitial thickening/scarring. It may represent minimal interstitial pulmonary edema, although this would be the least interstitial pulmonary edema seen on numerous comparison radiographs. Electronically Signed   By: Neita Garnet M.D.   On: 02/12/2023 17:13    Procedures Procedures    Medications Ordered in ED Medications  nitroGLYCERIN 50 mg in dextrose 5 % 250 mL (0.2 mg/mL) infusion (has no administration in time range)  insulin aspart (novoLOG) injection 8 Units (8 Units Intravenous Given 02/12/23 1758)    ED Course/ Medical Decision Making/ A&P  Medical Decision Making Amount and/or Complexity of Data Reviewed Labs:  ordered. Radiology: ordered.  Risk Prescription drug management. Decision regarding hospitalization.   This patient presents to the ED for concern of cp, this involves an extensive number of treatment options, and is a complaint that carries with it a high risk of complications and morbidity.  The differential diagnosis includes cad, pulm, gi   Co morbidities that complicate the patient evaluation  CHF, DM1, HTN, HLD, CAD, GERD, CKD, CHF, COPD, PAF s/p ablation and arthritis   Additional history obtained:  Additional history obtained from epic chart review External records from outside source obtained and reviewed including EMS report   Lab Tests:  I Ordered, and personally interpreted labs.  The pertinent results include:  cbc with wbc 15.5, hgb 10.4, bmp with glucose elevated at 456, bun 58 and cr 1.95 (bun 70 and cr 2.46 on 6/5), trop elevated at 107 (trop in May was 1740); 2nd trop 941   Imaging Studies ordered:  I ordered imaging studies including cxr  I independently visualized and interpreted imaging which showed   Moderate cardiomegaly.  2. Mild-to-moderate bilateral interstitial thickening, greatest  within the inferior lungs is not significantly changed from  05/16/2021. This is improved from most recent 12/08/2022 radiograph.  This may represent the patient's normal baseline and some  interstitial thickening/scarring. It may represent minimal  interstitial pulmonary edema, although this would be the least  interstitial pulmonary edema seen on numerous comparison  radiographs.   I agree with the radiologist interpretation   Cardiac Monitoring:  The patient was maintained on a cardiac monitor.  I personally viewed and interpreted the cardiac monitored which showed an underlying rhythm of: nsr   Medicines ordered and prescription drug management:  I ordered medication including insulin  for hyperglycemia  Reevaluation of the patient after these medicines  showed that the patient improved I have reviewed the patients home medicines and have made adjustments as needed  Critical Interventions:  insulin   Consultations Obtained:  I requested consultation with cardiology (Dr. Jacques Navy),  and discussed lab and imaging findings as well as pertinent plan - she will consult.  She requests medicine to admit. Pt d/w Dr. Antionette Char (triad) for admission.   Problem List / ED Course:  Elevated trop:  Continue to trend.  Cards did see her.  They plan for cath tomorrow.  Cards ordered nitroglycerin and heparin gtt Elevated BS:  Pt did not take her short acting insulin today.  She is given iv insulin.  She is not in DKA.   Reevaluation:  After the interventions noted above, I reevaluated the patient and found that they have :improved   Social Determinants of Health:  Lives at home   Dispostion:  After consideration of the diagnostic results and the patients response to treatment, I feel that the patent would benefit from admission.          Final Clinical Impression(s) / ED Diagnoses Final diagnoses:  NSTEMI (non-ST elevated myocardial infarction) (HCC)  Hyperglycemia    Rx / DC Orders ED Discharge Orders     None         Jacalyn Lefevre, MD 02/12/23 [phone]

## 2023-02-12 NOTE — ED Notes (Signed)
Pt was transported to unit in a stable condition by this nurse and handed over to primary care nurse.

## 2023-02-12 NOTE — ED Triage Notes (Signed)
Per [facility] pt coming from home c/o central chest pain radiating into left arm. Also having shortness of breath. 90% on RA and placed on 4L [place]. Took 3 nitroglycerin at home then had another 1 with ems and 324 aspirin.  Also given 150 CC NS en route.

## 2023-02-12 NOTE — H&P (Signed)
History and Physical    Dana [name] AOZ:[id number] DOB: [birth year] DOA: 02/12/2023  PCP: Blane Ohara, MD   Patient coming from: Home   Chief Complaint: Chest pain   HPI: Dana [name] is a 62 y.o. female with medical history significant for anxiety, chronic back pain, CKD 3B, OSA, chronic hypoxic respiratory failure, insulin-dependent diabetes mellitus, and complex cardiac history with multiple PCI procedures and complications including contrast-induced nephropathy who presents with chest pain.  Patient has had worsening angina over the past few days and developed substernal chest pressure when ambulating a short distance today.  This is despite recent increase in her isosorbide.  She took 3 nitroglycerin at home, had ongoing symptoms, and called EMS.  She was given another nitroglycerin and 324 mg of aspirin prior to arrival in the ED.  She denies any change in her chronic bilateral leg swelling or orthopnea.  ED Course: Upon arrival to the ED, patient is found to be afebrile and saturating mid 90s on 4 L/min of supplemental oxygen with normal heart rate and stable blood pressure.  EKG demonstrates sinus rhythm with right axis deviation and chest x-ray is notable for cardiomegaly and chronic interstitial thickening.  Labs are most notable for glucose 456, creatinine 1.95, WBC 15,500, and troponin of 107 which increased to 941.  Cardiology evaluated the patient in the emergency department and she was started on IV heparin and IV nitroglycerin infusions, and also given 8 units of NovoLog.  Review of Systems:  All other systems reviewed and apart from HPI, are negative.  Past Medical History:  Diagnosis Date   Acquired absence of left great toe (HCC)    Acquired absence of other left toe(s) (HCC)    ACS (acute coronary syndrome) (HCC)    Anemia    Anoxic brain injury (HCC)    Anxiety    CAD (coronary artery disease)    DES mLAD 04/07/15; DES dRCA & DES pPDA 08/30/16; DES mCX & PTCA OM1  09/29/20   Chronic diastolic (congestive) heart failure (HCC)    Chronic pain    Chronic systolic (congestive) heart failure (HCC)    CKD (chronic kidney disease)    Contrast dye induced nephropathy, possible 09/03/2020   COPD (chronic obstructive pulmonary disease) (HCC)    Diabetes (HCC)type 1    Diastolic CHF (HCC)    Dyspnea    Family history of breast cancer 10/23/2021   Gastro-esophageal reflux disease without esophagitis    Hyperlipidemia    Hypertension    Hypertensive heart disease with heart failure (HCC)    Hypoxemia    Idiopathic gout, multiple sites    Leukocytosis 03/29/2022   Localized edema    Low back pain    Mitral regurgitation    Mitral regurgitation    Mixed hyperlipidemia    Mixed incontinence    Morbid (severe) obesity due to excess calories (HCC)    Neuromuscular disorder (HCC)    Neuropathy    Non-ST elevation (NSTEMI) myocardial infarction (HCC)    08/29/20, 09/29/20   OSA (obstructive sleep apnea) 09/03/2020   Other specified hypothyroidism    PAF (paroxysmal atrial fibrillation) (HCC)    s/p pulmonary vein isolation by cryoablation 10/04/15 Dr. Rudolpho Sevin in [facility]   PEA (Pulseless electrical activity) [facility])    ~ 01/13/21 at [facility] during admission DKA, volume overload, possible CAP, hypoxia s/p ACLS with ROSC ~ 10-15   Pneumonia    Primary insomnia    Rheumatoid arthritis (HCC)  Stroke [facility])    Thyroid disease    Type 1 diabetes mellitus (HCC)    Vitamin D deficiency     Past Surgical History:  Procedure Laterality Date   ABDOMINAL HYSTERECTOMY     Partial- Still has both ovaries   CARDIOVASCULAR STRESS TEST  08/13/2014   Nuclear; Normal   CARPAL TUNNEL RELEASE     CATARACT EXTRACTION, BILATERAL     CESAREAN SECTION     CHOLECYSTECTOMY     CORONARY ANGIOPLASTY WITH STENT PLACEMENT     3 blockages/ 1 stent   CORONARY PRESSURE/FFR STUDY N/A 09/07/2022   Procedure: INTRAVASCULAR PRESSURE WIRE/FFR STUDY;  Surgeon: Swaziland, Peter M,  MD;  Location: [facility] INVASIVE CV LAB;  Service: Cardiovascular;  Laterality: N/A;   CORONARY STENT INTERVENTION N/A 09/29/2020   Procedure: CORONARY STENT INTERVENTION;  Surgeon: Tonny Bollman, MD;  Location: [facility] INVASIVE CV LAB;  Service: Cardiovascular;  Laterality: N/A;  CFX   CORONARY STENT INTERVENTION N/A 09/07/2022   Procedure: CORONARY STENT INTERVENTION;  Surgeon: Swaziland, Peter M, MD;  Location: [facility] INVASIVE CV LAB;  Service: Cardiovascular;  Laterality: N/A;   CORONARY/GRAFT ACUTE MI REVASCULARIZATION N/A 12/08/2022   Procedure: Coronary/Graft Acute MI Revascularization;  Surgeon: Marykay Lex, MD;  Location: [facility] INVASIVE CV LAB;  Service: Cardiovascular;  Laterality: N/A;   EYE SURGERY     LEFT HEART CATH AND CORONARY ANGIOGRAPHY N/A 09/29/2020   Procedure: LEFT HEART CATH AND CORONARY ANGIOGRAPHY;  Surgeon: Tonny Bollman, MD;  Location: [facility] INVASIVE CV LAB;  Service: Cardiovascular;  Laterality: N/A;   LEFT HEART CATH AND CORONARY ANGIOGRAPHY N/A 09/07/2022   Procedure: LEFT HEART CATH AND CORONARY ANGIOGRAPHY;  Surgeon: Swaziland, Peter M, MD;  Location: [facility] INVASIVE CV LAB;  Service: Cardiovascular;  Laterality: N/A;   LEFT HEART CATH AND CORONARY ANGIOGRAPHY N/A 12/08/2022   Procedure: LEFT HEART CATH AND CORONARY ANGIOGRAPHY;  Surgeon: Marykay Lex, MD;  Location: [facility] INVASIVE CV LAB;  Service: Cardiovascular;  Laterality: N/A;   MITRAL VALVE REPAIR N/A 05/18/2021   Procedure: MITRAL VALVE REPAIR;  Surgeon: Orbie Pyo, MD;  Location: [facility] INVASIVE CV LAB;  Service: Cardiovascular;  Laterality: N/A;   RIGHT HEART CATH N/A 04/27/2021   Procedure: RIGHT HEART CATH;  Surgeon: Dolores Patty, MD;  Location: [facility] INVASIVE CV LAB;  Service: Cardiovascular;  Laterality: N/A;   RIGHT/LEFT HEART CATH AND CORONARY ANGIOGRAPHY N/A 01/07/2017   Procedure: Right/Left Heart Cath and Coronary Angiography;  Surgeon: Laurey Morale, MD;  Location: [facility] INVASIVE CV LAB;  Service: Cardiovascular;   Laterality: N/A;   ROTATOR CUFF REPAIR     TEE WITHOUT CARDIOVERSION N/A 04/28/2021   Procedure: TRANSESOPHAGEAL ECHOCARDIOGRAM (TEE);  Surgeon: Dolores Patty, MD;  Location: [facility] ENDOSCOPY;  Service: Cardiovascular;  Laterality: N/A;   TEE WITHOUT CARDIOVERSION N/A 05/18/2021   Procedure: TRANSESOPHAGEAL ECHOCARDIOGRAM (TEE);  Surgeon: Orbie Pyo, MD;  Location: [facility] INVASIVE CV LAB;  Service: Cardiovascular;  Laterality: N/A;   TOE AMPUTATION Left    US ECHOCARDIOGRAPHY  05/2017   Normal    Social History:   reports that she quit smoking about 40 years ago. Her smoking use included cigarettes. She started smoking about 41 years ago. She has been exposed to tobacco smoke. She has never used smokeless tobacco. She reports that she does not drink alcohol and does not use drugs.  Allergies  Allergen Reactions   Naproxen Hives and Rash   Shellfish-Derived Products Hives, Swelling and Other (See Comments)  Angioedema    Strawberry (Diagnostic) Hives   Celecoxib Other (See Comments)    Stomach pain   Glucosamine Hives, Swelling and Other (See Comments)    Angioedema    Iron     IV iron transfusion - hives - Feb 2022 hospitalization   Metoclopramide Other (See Comments)    Facial twitching and stuttering   Metolazone Other (See Comments)    Acute renal failure   Statins Other (See Comments)    Muscle pain    Family History  Problem Relation Age of Onset   Breast cancer Mother 13       contralateral breast ca dx 65   Coronary artery disease Mother    Hypertension Mother    Hyperlipidemia Mother    Diabetes type II Mother    Lung cancer Mother 58   Diabetes Father    Hypertension Father    Mental illness Sister    Bipolar disorder Other    Depression Other    Schizophrenia Other    Cancer Other        MGF's sisters x2; unknown type     Prior to Admission medications   Medication Sig Start Date End Date Taking? Authorizing Provider  allopurinol (ZYLOPRIM) 300  MG tablet TAKE ONE TABLET BY MOUTH ONCE DAILY 01/03/23  Yes Cox, Kirsten, MD  aspirin EC 81 MG tablet Take 1 tablet (81 mg total) by mouth daily. Swallow whole. 09/09/22  Yes Cyndi Bender, NP  busPIRone (BUSPAR) 10 MG tablet Take 1 tablet (10 mg total) by mouth 3 (three) times daily. 12/17/22  Yes Cox, Kirsten, MD  carvedilol (COREG) 12.5 MG tablet TAKE ONE TABLET BY MOUTH twice daily 01/03/23  Yes Bensimhon, Bevelyn Buckles, MD  cephALEXin (KEFLEX) 250 MG capsule Take 250 mg by mouth at bedtime. 11/30/22  Yes [provider]  CRANBERRY PO Take 2 tablets by mouth daily.   Yes [provider]  EPINEPHrine 0.3 mg/0.3 mL IJ SOAJ injection Use as directed for life-threatening allergic reaction. 06/24/18  Yes Padgett, Pilar Grammes, MD  ezetimibe (ZETIA) 10 MG tablet Take 1 tablet (10 mg total) by mouth daily. Patient taking differently: Take 10 mg by mouth every evening. 11/05/22  Yes Cox, Kirsten, MD  HYDROcodone-acetaminophen (NORCO) 7.5-325 MG tablet Take 1 tablet by mouth 3 (three) times daily as needed. 02/01/23  Yes Cox, Kirsten, MD  insulin aspart (NOVOLOG FLEXPEN) 100 UNIT/ML FlexPen 2-10 U before meals based on SSI. 10/09/22  Yes Cox, Kirsten, MD  isosorbide mononitrate (IMDUR) 60 MG 24 hr tablet Take 1 tablet (60 mg total) by mouth daily. Patient taking differently: Take 90 mg by mouth daily. 12/25/22  Yes Milford, Anderson Malta, FNP  latanoprost (XALATAN) 0.005 % ophthalmic solution Place 1 drop into both eyes at bedtime.   Yes [provider]  LEVEMIR FLEXPEN 100 UNIT/ML FlexPen Inject 50 units into THE SKIN daily Patient taking differently: Inject 55 Units into the skin daily. 01/13/23  Yes Cox, Kirsten, MD  linaclotide [facility]) 145 MCG CAPS capsule TAKE ONE CAPSULE BY MOUTH BEFORE BREAKFAST 01/03/23  Yes Cox, Kirsten, MD  LORazepam (ATIVAN) 0.5 MG tablet TAKE ONE TABLET BY MOUTH ONCE daily AS NEEDED FOR ANXIETY 01/23/23  Yes Cox, Kirsten, MD  magnesium oxide (MAG-OX) 400 MG tablet  Take 1 tablet (400 mg total) by mouth daily. Take 2 (400 mg) daily=800 mg Patient taking differently: Take 800 mg by mouth daily. Take 2 (400 mg) daily=800 mg 12/16/22  Yes Rolly Salter, MD  nitroGLYCERIN (NITROSTAT) 0.4 MG SL tablet DISSOLVE ONE TABLET UNDER THE TONGUE EVERY 5 MINUTES AS NEEDED FOR CHEST PAIN.  DO NOT EXCEED A TOTAL OF 3 DOSES IN 15 MINUTES 01/21/23  Yes Bensimhon, Bevelyn Buckles, MD  Omega-3 Fatty Acids (FISH OIL PO) Take 1 capsule by mouth daily.   Yes [provider]  pantoprazole (PROTONIX) 40 MG tablet TAKE ONE TABLET BY MOUTH ONCE DAILY 01/03/23  Yes Cox, Kirsten, MD  polyethylene glycol (MIRALAX / GLYCOLAX) 17 g packet Take 17 g by mouth daily. 12/17/22  Yes Rolly Salter, MD  potassium chloride SA (KLOR-CON M) 20 MEQ tablet TAKE ONE TABLET BY MOUTH ONCE DAILY Patient taking differently: Take 20 mEq by mouth 2 (two) times a week. 01/03/23  Yes Cox, Kirsten, MD  Probiotic CAPS Take 1 capsule by mouth daily.   Yes [provider]  promethazine (PHENERGAN) 25 MG tablet Take 1 tablet (25 mg total) by mouth daily as needed. Patient taking differently: Take 25 mg by mouth daily as needed for nausea or vomiting. 10/22/22  Yes Cox, Kirsten, MD  ticagrelor (BRILINTA) 90 MG TABS tablet Take 1 tablet (90 mg total) by mouth 2 (two) times daily. 12/25/22  Yes Milford, Anderson Malta, FNP  torsemide (DEMADEX) 20 MG tablet TAKE 4 TABLETS BY MOUTH ONCE daily 01/03/23  Yes Milford, Anderson Malta, FNP  Continuous Blood Gluc Sensor (FREESTYLE LIBRE 2 SENSOR) MISC USE TO check blood glucose AS DIRECTED AND CHANGE sensor every 14 DAYS 01/11/22   [provider]  Insulin Pen Needle (BD PEN NEEDLE NANO 2ND GEN) 32G X 4 MM MISC Inject 1 each into the skin in the morning, at noon, in the evening, and at bedtime. 08/30/21   CoxFritzi Mandes, MD  OXYGEN Inhale 2 L into the lungs at bedtime.    [provider]  REPATHA SURECLICK 140 MG/ML SOAJ inject 140mg  intot HEART SKIN every 14 DAYS 12/28/22    Bensimhon, Bevelyn Buckles, MD    Physical Exam: Vitals:   02/12/23 1453 02/12/23 1455 02/12/23 1700 02/12/23 2044  BP: (!) 158/72  138/63 (!) 141/58  Pulse: 96  85 78  Resp: 16  14 16   Temp: 97.7 F (36.5 C)   97.8 F (36.6 C)  TempSrc: Oral   Oral  SpO2: 96%  96% 100%  Weight:  99.3 kg    Height:  5' 4.5" (1.638 m)       Constitutional: NAD, no pallor or diaphoresis   Eyes: PERTLA, lids and conjunctivae normal ENMT: Mucous membranes are moist. Posterior pharynx clear of any exudate or lesions.   Neck: supple, no masses  Respiratory: no wheezing, no crackles. No accessory muscle use.  Cardiovascular: S1 & S2 heard, regular rate and rhythm. Pretibial pitting edema b/l.  Abdomen: No distension, no tenderness, soft. Bowel sounds active.  Musculoskeletal: no clubbing / cyanosis. No joint deformity upper and lower extremities.   Skin: no significant rashes, lesions, ulcers. Warm, dry, well-perfused. Neurologic: CN 2-12 grossly intact. Moving all extremities. Alert and oriented.  Psychiatric: Calm. Cooperative.    Labs and Imaging on Admission: I have personally reviewed following labs and imaging studies  CBC: Recent Labs  Lab 02/12/23 1501  WBC 15.5*  HGB 10.4*  HCT 33.0*  MCV 86.4  PLT 243   Basic Metabolic Panel: Recent Labs  Lab 02/12/23 1501  NA 135  K 3.8  CL 100  CO2 21*  GLUCOSE 456*  BUN 58*  CREATININE 1.95*  CALCIUM 8.8*  GFR: Estimated Creatinine Clearance: 35.5 mL/min (A) (by C-G formula based on SCr of 1.95 mg/dL (H)). Liver Function Tests: No results for input(s): "AST", "ALT", "ALKPHOS", "BILITOT", "PROT", "ALBUMIN" in the last 168 hours. No results for input(s): "LIPASE", "AMYLASE" in the last 168 hours. No results for input(s): "AMMONIA" in the last 168 hours. Coagulation Profile: No results for input(s): "INR", "PROTIME" in the last 168 hours. Cardiac Enzymes: No results for input(s): "CKTOTAL", "CKMB", "CKMBINDEX", "TROPONINI" in the last  168 hours. BNP (last 3 results) No results for input(s): "PROBNP" in the last 8760 hours. HbA1C: No results for input(s): "HGBA1C" in the last 72 hours. CBG: Recent Labs  Lab 02/12/23 1744 02/12/23 2051  GLUCAP 442* 354*   Lipid Profile: No results for input(s): "CHOL", "HDL", "LDLCALC", "TRIG", "CHOLHDL", "LDLDIRECT" in the last 72 hours. Thyroid Function Tests: No results for input(s): "TSH", "T4TOTAL", "FREET4", "T3FREE", "THYROIDAB" in the last 72 hours. Anemia Panel: No results for input(s): "VITAMINB12", "FOLATE", "FERRITIN", "TIBC", "IRON", "RETICCTPCT" in the last 72 hours. Urine analysis:    Component Value Date/Time   COLORURINE YELLOW 12/11/2022 1704   APPEARANCEUR HAZY (A) 12/11/2022 1704   LABSPEC 1.009 12/11/2022 1704   PHURINE 5.0 12/11/2022 1704   GLUCOSEU NEGATIVE 12/11/2022 1704   HGBUR NEGATIVE 12/11/2022 1704   BILIRUBINUR NEGATIVE 12/11/2022 1704   BILIRUBINUR negative 09/27/2022 1131   BILIRUBINUR negative 07/11/2021 0812   KETONESUR NEGATIVE 12/11/2022 1704   PROTEINUR NEGATIVE 12/11/2022 1704   UROBILINOGEN 0.2 09/27/2022 1131   NITRITE NEGATIVE 12/11/2022 1704   LEUKOCYTESUR NEGATIVE 12/11/2022 1704   Sepsis Labs: @LABRCNTIP (procalcitonin:4,lacticidven:4) )No results found for this or any previous visit (from the past 240 hour(s)).   Radiological Exams on Admission: DG Chest 2 View  Result Date: 02/12/2023 CLINICAL DATA:  Chest pain, shortness of breath, and pain radiating down left arm. History of CHF. EXAM: CHEST - 2 VIEW COMPARISON:  Chest radiographs 12/08/2022, 12/06/2022, 09/07/2022, 05/16/2021, 10/03/2020 FINDINGS: Cardiac silhouette is again moderately markedly enlarged. Mediastinal contours within normal limits. Mild-to-moderate bilateral interstitial thickening, greatest within the inferior lungs is not significantly changed from 05/16/2021. This is improved from most recent 12/08/2022 radiograph. Mitral valve clips and CardioMEMS left  pulmonary arterial device are unchanged in position. The posterior costophrenic angles are sharp on lateral view and no definite pleural effusion is seen. There is blunting of the bilateral costophrenic angles on frontal view likely due to chronic interstitial opacification. No pneumothorax is seen. Mild dextrocurvature of the mid to upper thoracic spine with moderate multilevel disc space narrowing. IMPRESSION: 1. Moderate cardiomegaly. 2. Mild-to-moderate bilateral interstitial thickening, greatest within the inferior lungs is not significantly changed from 05/16/2021. This is improved from most recent 12/08/2022 radiograph. This may represent the patient's normal baseline and some interstitial thickening/scarring. It may represent minimal interstitial pulmonary edema, although this would be the least interstitial pulmonary edema seen on numerous comparison radiographs. Electronically Signed   By: Neita Garnet M.D.   On: 02/12/2023 17:13    EKG: Independently reviewed. Sinus rhythm.   Assessment/Plan   1. NSTEMI  - Continue IV heparin and IV nitroglycerin infusions, continue antiplatelets and beta-blocker, hold torsemide, repeat chem panel in am, start gentle IVF hydration in AM    2. Insulin-dependent DM  - A1c was 7.4% in February 2024  - Check CBGs, continue basal and correctional insulins    3. CKD 3B  - Appears close to baseline on admission  - Renally-dose medications, hold torsemide and start gentle IVF hydration in am  for heart cath    4. GAD  - Continue Buspar and Ativan    5. Chronic back pain  - Continue Norco as at home    6. OSA; chronic hypoxic respiratory failure  - Continue CPAP at bedtime, supplemental O2   7. Chronic HFpEF  - Appears compensated  - Holding torsemide and starting gentle IVF hydration in am for heart cath with   DVT prophylaxis: IV heparin  Code Status: Full  Level of Care: Level of care: Progressive Family Communication: none present   Disposition Plan:  Patient is from: home  Anticipated d/c is to: Home  Anticipated d/c date is: 8/1 or 02/15/23  Patient currently: Pending likely LHC tomorrow  Consults called: Cardiology  Admission status: Inpatient     Briscoe Deutscher, MD [facility]  02/12/2023, 9:26 PM

## 2023-02-12 NOTE — ED Notes (Signed)
Patient transported to X-ray 

## 2023-02-12 NOTE — ED Notes (Signed)
ED TO INPATIENT HANDOFF REPORT  ED Nurse Name and Phone #: Lodema Pilot Name/Age/Gender [name] 62 y.o. female Room/Bed: 040C/040C  Code Status   Code Status: Prior  Home/SNF/Other Home Patient oriented to: self, place, time, and situation Is this baseline? Yes   Triage Complete: Triage complete  Chief Complaint NSTEMI (non-ST elevated myocardial infarction) [name]) [I21.4]  Triage Note Per [name] pt coming from home c/o central chest pain radiating into left arm. Also having shortness of breath. 90% on RA and placed on 4L [place]. Took 3 nitroglycerin at home then had another 1 with ems and 324 aspirin.  Also given 150 CC NS en route.    Allergies Allergies  Allergen Reactions   Naproxen Hives and Rash   Shellfish-Derived Products Hives, Swelling and Other (See Comments)    Angioedema    Strawberry (Diagnostic) Hives   Celecoxib Other (See Comments)    Stomach pain   Glucosamine Hives, Swelling and Other (See Comments)    Angioedema    Iron     IV iron transfusion - hives - Feb 2022 hospitalization   Metoclopramide Other (See Comments)    Facial twitching and stuttering   Metolazone Other (See Comments)    Acute renal failure   Statins Other (See Comments)    Muscle pain    Level of Care/Admitting Diagnosis ED Disposition     ED Disposition  Admit   Condition  --   Comment  [name]: [name] [[id number]]  Level of Care: Progressive [102]  Admit to Progressive based on following criteria: CARDIOVASCULAR & THORACIC of moderate stability with acute coronary syndrome symptoms/low risk myocardial infarction/hypertensive urgency/arrhythmias/heart failure potentially compromising stability and stable post cardiovascular intervention patients.  May admit patient to [name] or [name] if equivalent level of care is available:: No  Covid Evaluation: Asymptomatic - no recent exposure (last 10 days) testing not required  Diagnosis:  NSTEMI (non-ST elevated myocardial infarction) [name]) [[id number]]  Admitting Physician: [name] [[id number]]  Attending Physician: [name] [[id number]]  Certification:: I certify this patient will need inpatient services for at least 2 midnights  Estimated Length of Stay: 3          B Medical/Surgery History Past Medical History:  Diagnosis Date   Acquired absence of left great toe (HCC)    Acquired absence of other left toe(s) (HCC)    ACS (acute coronary syndrome) (HCC)    Anemia    Anoxic brain injury (HCC)    Anxiety    CAD (coronary artery disease)    DES mLAD 04/07/15; DES dRCA & DES pPDA 08/30/16; DES mCX & PTCA OM1 09/29/20   Chronic diastolic (congestive) heart failure (HCC)    Chronic pain    Chronic systolic (congestive) heart failure (HCC)    CKD (chronic kidney disease)    Contrast dye induced nephropathy, possible 09/03/2020   COPD (chronic obstructive pulmonary disease) (HCC)    Diabetes (HCC)type 1    Diastolic CHF (HCC)    Dyspnea    Family history of breast cancer 10/23/2021   Gastro-esophageal reflux disease without esophagitis    Hyperlipidemia    Hypertension    Hypertensive heart disease with heart failure (HCC)    Hypoxemia    Idiopathic gout, multiple sites    Leukocytosis 03/29/2022   Localized edema    Low back pain    Mitral regurgitation    Mitral regurgitation    Mixed hyperlipidemia  Mixed incontinence    Morbid (severe) obesity due to excess calories (HCC)    Neuromuscular disorder (HCC)    Neuropathy    Non-ST elevation (NSTEMI) myocardial infarction (HCC)    08/29/20, 09/29/20   OSA (obstructive sleep apnea) 09/03/2020   Other specified hypothyroidism    PAF (paroxysmal atrial fibrillation) (HCC)    s/p pulmonary vein isolation by cryoablation 10/04/15 Dr. Rudolpho [name] in [name]   PEA (Pulseless electrical activity) [name])    ~ [facility]/13/[facility] at [name] during admission DKA, volume overload, possible CAP, hypoxia s/p ACLS with  ROSC ~ 10-15   Pneumonia    Primary insomnia    Rheumatoid arthritis (HCC)    Stroke (HCC)    Thyroid disease    Type 1 diabetes mellitus (HCC)    Vitamin D deficiency    Past Surgical History:  Procedure Laterality Date   ABDOMINAL HYSTERECTOMY     Partial- Still has both ovaries   CARDIOVASCULAR STRESS TEST  08/13/2014   Nuclear; Normal   CARPAL TUNNEL RELEASE     CATARACT EXTRACTION, BILATERAL     CESAREAN SECTION     CHOLECYSTECTOMY     CORONARY ANGIOPLASTY WITH STENT PLACEMENT     3 blockages/ 1 stent   CORONARY PRESSURE/FFR STUDY N/A 09/07/2022   Procedure: INTRAVASCULAR PRESSURE WIRE/FFR STUDY;  Surgeon: Swaziland, Peter M, MD;  Location: [name];  Service: Cardiovascular;  Laterality: N/A;   CORONARY STENT INTERVENTION N/A 09/29/2020   Procedure: CORONARY STENT INTERVENTION;  Surgeon: Dana Bollman, MD;  Location: [name];  Service: Cardiovascular;  Laterality: N/A;  CFX   CORONARY STENT INTERVENTION N/A 09/07/2022   Procedure: CORONARY STENT INTERVENTION;  Surgeon: Swaziland, Peter M, MD;  Location: [name];  Service: Cardiovascular;  Laterality: N/A;   CORONARY/GRAFT ACUTE MI REVASCULARIZATION N/A 12/08/2022   Procedure: Coronary/Graft Acute MI Revascularization;  Surgeon: Dana Lex, MD;  Location: [name];  Service: Cardiovascular;  Laterality: N/A;   EYE SURGERY     LEFT HEART CATH AND CORONARY ANGIOGRAPHY N/A 09/29/2020   Procedure: LEFT HEART CATH AND CORONARY ANGIOGRAPHY;  Surgeon: Dana Bollman, MD;  Location: [name];  Service: Cardiovascular;  Laterality: N/A;   LEFT HEART CATH AND CORONARY ANGIOGRAPHY N/A 09/07/2022   Procedure: LEFT HEART CATH AND CORONARY ANGIOGRAPHY;  Surgeon: Swaziland, Peter M, MD;  Location: [name];  Service: Cardiovascular;  Laterality: N/A;   LEFT HEART CATH AND CORONARY ANGIOGRAPHY N/A 12/08/2022   Procedure: LEFT HEART CATH AND CORONARY ANGIOGRAPHY;  Surgeon: Dana Lex, MD;  Location: [name] IN[name];  Service: Cardiovascular;  Laterality: N/A;   MITRAL VALVE REPAIR N/A 05/18/2021   Procedure: MITRAL VALVE REPAIR;  Surgeon: Orbie Pyo, MD;  Location: [name];  Service: Cardiovascular;  Laterality: N/A;   RIGHT HEART CATH N/A 04/27/2021   Procedure: RIGHT HEART CATH;  Surgeon: Dolores Patty, MD;  Location: [name];  Service: Cardiovascular;  Laterality: N/A;   RIGHT/LEFT HEART CATH AND CORONARY ANGIOGRAPHY N/A 01/07/2017   Procedure: Right/Left Heart Cath and Coronary Angiography;  Surgeon: Laurey Morale, MD;  Location: [name] INVASIVE [name];  Service: Cardiovascular;  Laterality: N/A;   ROTATOR CUFF REPAIR     TEE WITHOUT CARDIOVERSION N/A 04/28/2021   Procedure: TRANSESOPHAGEAL ECHOCARDIOGRAM (TEE);  Surgeon: Dolores Patty, MD;  Location: [facility] ENDOSCOPY;  Service: Cardiovascular;  Laterality: N/A;   TEE WITHOUT CARDIOVERSION N/A  05/18/2021   Procedure: TRANSESOPHAGEAL ECHOCARDIOGRAM (TEE);  Surgeon: Orbie Pyo, MD;  Location: [name] INVAS[name];  Service: Cardiovascular;  Laterality: N/A;   TOE AMPUTATION Left    US ECHOCARDIOGRAPHY  05/2017   Normal     A IV Location/Drains/Wounds Patient Lines/Drains/Airways Status     Active Line/Drains/Airways     Name Placement date Placement time Site Days   Peripheral IV 12/06/22 Right Antecubital 12/06/22  1036  Antecubital  68   Peripheral IV 12/08/22 20 G 1.88" Anterior;Left Forearm 12/08/22  0030  Forearm  66   Peripheral IV 12/08/22 20 G 2.5" Anterior;Left;Upper Arm 12/08/22  0043  Arm  66   Peripheral IV 02/12/23 20 G Anterior;Right;Upper Arm 02/12/23  1430  Arm  less than 1            Intake/Output Last 24 hours No intake or output data in the 24 hours ending 02/12/23 1931  Labs/Imaging Results for orders placed or performed during the [name] encounter of 02/12/23 (from the past 48 hour(s))  Basic metabolic panel     Status: Abnormal    Collection Time: 02/12/23  3:01 PM  Result Value Ref Range   Sodium 135 135 - 145 mmol/L   Potassium 3.8 3.5 - 5.1 mmol/L   Chloride 100 98 - 111 mmol/L   CO2 21 (L) 22 - 32 mmol/L   Glucose, Bld 456 (H) 70 - 99 mg/dL    Comment: Glucose reference range applies only to samples taken after fasting for at least 8 hours.   BUN 58 (H) 6 - 20 mg/dL   Creatinine, Ser 8.65 (H) 0.44 - 1.00 mg/dL   Calcium 8.8 (L) 8.9 - 10.3 mg/dL   GFR, Estimated 29 (L) >60 mL/min    Comment: (NOTE) Calculated using the CKD-EPI Creatinine Equation (2021)    Anion gap 14 5 - 15    Comment: Performed at [name], [address], [place], [place] [address]  CBC     Status: Abnormal   Collection Time: 02/12/23  3:01 PM  Result Value Ref Range   WBC 15.5 (H) 4.0 - 10.5 K/uL   RBC 3.82 (L) 3.87 - 5.11 MIL/uL   Hemoglobin 10.4 (L) 12.0 - 15.0 g/dL   HCT 62.9 (L) 52.8 - 41.3 %   MCV 86.4 80.0 - 100.0 fL   MCH 27.2 26.0 - 34.0 pg   MCHC 31.5 30.0 - 36.0 g/dL   RDW 24.4 (H) 01.0 - 27.2 %   Platelets 243 150 - 400 K/uL   nRBC 0.0 0.0 - 0.2 %    Comment: Performed at [name], [address] [place], [place] [address]  Troponin I (High Sensitivity)     Status: Abnormal   Collection Time: 02/12/23  3:01 PM  Result Value Ref Range   Troponin I (High Sensitivity) 107 (HH) <18 ng/L    Comment: CRITICAL RESULT CALLED TO, READ BACK BY AND VERIFIED WITH K.RAN,RN @1544  02/12/2023 VANG.J (NOTE) Elevated high sensitivity troponin I (hsTnI) values and significant  changes across serial measurements may suggest ACS but many other  chronic and acute conditions are known to elevate hsTnI results.  Refer to the "Links" section for chest pain algorithms and additional  guidance. Performed at [name], [address], [place], [place] [address]   Troponin I (High Sensitivity)     Status: Abnormal   Collection Time: 02/12/23  5:40 PM  Result Value Ref Range  Troponin I (High Sensitivity) 941  (HH) <18 ng/L    Comment: CRITICAL VALUE NOTED.  VALUE IS CONSISTENT WITH PREVIOUSLY REPORTED AND CALLED VALUE. (NOTE) Elevated high sensitivity troponin I (hsTnI) values and significant  changes across serial measurements may suggest ACS but many other  chronic and acute conditions are known to elevate hsTnI results.  Refer to the "Links" section for chest pain algorithms and additional  guidance. Performed at [name], [address], [place], [place] [address]   Beta-hydroxybutyric acid     Status: None   Collection Time: 02/12/23  5:40 PM  Result Value Ref Range   Beta-Hydroxybutyric Acid 0.23 0.05 - 0.27 mmol/L    Comment: Performed at [name], [address], [place], [place] [address]  CBG monitoring, ED     Status: Abnormal   Collection Time: 02/12/23  5:44 PM  Result Value Ref Range   Glucose-Capillary 442 (H) 70 - 99 mg/dL    Comment: Glucose reference range applies only to samples taken after fasting for at least 8 hours.   DG Chest 2 View  Result Date: 02/12/2023 CLINICAL DATA:  Chest pain, shortness of breath, and pain radiating down left arm. History of CHF. EXAM: CHEST - 2 VIEW COMPARISON:  Chest radiographs 12/08/2022, 12/06/2022, 09/07/2022, 05/16/2021, 10/03/2020 FINDINGS: Cardiac silhouette is again moderately markedly enlarged. Mediastinal contours within normal limits. Mild-to-moderate bilateral interstitial thickening, greatest within the inferior lungs is not significantly changed from 05/16/2021. This is improved from most recent 12/08/2022 radiograph. Mitral valve clips and CardioMEMS left pulmonary arterial device are unchanged in position. The posterior costophrenic angles are sharp on lateral view and no definite pleural effusion is seen. There is blunting of the bilateral costophrenic angles on frontal view likely due to chronic interstitial opacification. No pneumothorax is seen. Mild dextrocurvature of the mid to upper thoracic spine with  moderate multilevel disc space narrowing. IMPRESSION: 1. Moderate cardiomegaly. 2. Mild-to-moderate bilateral interstitial thickening, greatest within the inferior lungs is not significantly changed from 05/16/2021. This is improved from most recent 12/08/2022 radiograph. This may represent the patient's normal baseline and some interstitial thickening/scarring. It may represent minimal interstitial pulmonary edema, although this would be the least interstitial pulmonary edema seen on numerous comparison radiographs. Electronically Signed   By: Neita Garnet [name].D.   On: 02/12/2023 17:13    Pending Labs Unresulted Labs (From admission, onward)     Start     Ordered   02/14/23 0500  Heparin level (unfractionated)  Daily,   R      02/12/23 1925   02/13/23 0500  Hemoglobin A1c  Tomorrow morning,   R       Comments: To assess prior glycemic control    02/12/23 1920   02/13/23 0500  CBC  Daily,   R      02/12/23 1925   02/13/23 0200  Heparin level (unfractionated)  Once-Timed,   TIMED        02/12/23 1925            Vitals/Pain Today's Vitals   02/12/23 1454 02/12/23 1455 02/12/23 1700 02/12/23 1913  BP:   138/63   Pulse:   85   Resp:   14   Temp:      TempSrc:      SpO2:   96%   Weight:  99.3 kg    Height:  5' 4.5" (1.638 [name])    PainSc: 8    3     Isolation  Precautions No active isolations  Medications Medications  nitroGLYCERIN 50 mg in dextrose 5 % 250 mL (0.2 mg/mL) infusion (5 mcg/min Intravenous New Bag/Given 02/12/23 1913)  insulin glargine-yfgn (SEMGLEE) injection 25 Units (has no administration in time range)  insulin aspart (novoLOG) injection 0-6 Units (has no administration in time range)  HYDROcodone-acetaminophen (NORCO) 7.5-325 MG per tablet 1 tablet (has no administration in time range)  busPIRone (BUSPAR) tablet 10 mg (has no administration in time range)  LORazepam (ATIVAN) tablet 0.5 mg (has no administration in time range)  heparin bolus via infusion 4,000  Units (has no administration in time range)  heparin ADULT infusion 100 units/mL (25000 units/241mL) (has no administration in time range)  insulin aspart (novoLOG) injection 8 Units (8 Units Intravenous Given 02/12/23 1758)    Mobility walks with person assist     Focused Assessments Cardiac Assessment Handoff:  Cardiac Rhythm: Normal sinus rhythm [name] Results  Component Value Date   TROPONINI 0.03 (HH) 11/07/2017   [name] Results  Component Value Date   DDIMER 1.24 (H) 02/11/2018   Does the Patient currently have chest pain? Yes    R Recommendations: See Admitting Provider Note  Report given to:   Additional Notes:   Currently on a nitroglycerin drip, waiting on IV team for 2nd line for heparin

## 2023-02-12 NOTE — Progress Notes (Signed)
ANTICOAGULATION CONSULT NOTE - Initial Consult  Pharmacy Consult for Heparin Indication: chest pain/ACS  Allergies  Allergen Reactions   Naproxen Hives and Rash   Shellfish-Derived Products Hives, Swelling and Other (See Comments)    Angioedema    Strawberry (Diagnostic) Hives   Celecoxib Other (See Comments)    Stomach pain   Glucosamine Hives, Swelling and Other (See Comments)    Angioedema    Iron     IV iron transfusion - hives - Feb 2022 hospitalization   Metoclopramide Other (See Comments)    Facial twitching and stuttering   Metolazone Other (See Comments)    Acute renal failure   Statins Other (See Comments)    Muscle pain    Patient Measurements: Height: 5' 4.5" (163.8 cm) Weight: 99.3 kg (219 lb) IBW/kg (Calculated) : 55.85 Heparin Dosing Weight: 78.7 kg  Vital Signs: Temp: 97.7 F (36.5 C) (07/30 1453) Temp Source: Oral (07/30 1453) BP: 138/63 (07/30 1700) Pulse Rate: 85 (07/30 1700)  Labs: Recent Labs    02/12/23 1501 02/12/23 1740  HGB 10.4*  --   HCT 33.0*  --   PLT 243  --   CREATININE 1.95*  --   TROPONINIHS 107* 941*    Estimated Creatinine Clearance: 35.5 mL/min (A) (by C-G formula based on SCr of 1.95 mg/dL (H)).   Medical History: Past Medical History:  Diagnosis Date   Acquired absence of left great toe (HCC)    Acquired absence of other left toe(s) (HCC)    ACS (acute coronary syndrome) (HCC)    Anemia    Anoxic brain injury (HCC)    Anxiety    CAD (coronary artery disease)    DES mLAD 04/07/15; DES dRCA & DES pPDA 08/30/16; DES mCX & PTCA OM1 09/29/20   Chronic diastolic (congestive) heart failure (HCC)    Chronic pain    Chronic systolic (congestive) heart failure (HCC)    CKD (chronic kidney disease)    Contrast dye induced nephropathy, possible 09/03/2020   COPD (chronic obstructive pulmonary disease) (HCC)    Diabetes (HCC)type 1    Diastolic CHF (HCC)    Dyspnea    Family history of breast cancer 10/23/2021    Gastro-esophageal reflux disease without esophagitis    Hyperlipidemia    Hypertension    Hypertensive heart disease with heart failure (HCC)    Hypoxemia    Idiopathic gout, multiple sites    Leukocytosis 03/29/2022   Localized edema    Low back pain    Mitral regurgitation    Mitral regurgitation    Mixed hyperlipidemia    Mixed incontinence    Morbid (severe) obesity due to excess calories (HCC)    Neuromuscular disorder (HCC)    Neuropathy    Non-ST elevation (NSTEMI) myocardial infarction (HCC)    08/29/20, 09/29/20   OSA (obstructive sleep apnea) 09/03/2020   Other specified hypothyroidism    PAF (paroxysmal atrial fibrillation) (HCC)    s/p pulmonary vein isolation by cryoablation 10/04/15 Dr. Rudolpho Sevin in [facility]   PEA (Pulseless electrical activity) [facility])    ~ 01/13/21 at [facility] during admission DKA, volume overload, possible CAP, hypoxia s/p ACLS with ROSC ~ 10-15   Pneumonia    Primary insomnia    Rheumatoid arthritis (HCC)    Stroke (HCC)    Thyroid disease    Type 1 diabetes mellitus (HCC)    Vitamin D deficiency    Assessment: 89 YOF w/ PMH sig for multiple PCI procedures, HFpEF, CardioMEMS  device, secondary mitral regurg w/ MitraClip. Pt presented w/ progressive chest discomfort x3 days. No evidence of PTA anticoag use.   Hgb 10.4, plts 243 No signs of bleeding  Goal of Therapy:  Heparin level 0.3-0.7 units/ml Monitor platelets by anticoagulation protocol: Yes   Plan:  Give 4000 units bolus x 1 Start heparin infusion at 950 units/hr Check heparin level in 6 hours and daily  Monitor H/H, plts, and for signs of bleeding  Laqueta Jean PharmD Candidate 02/12/2023 7:30 PM

## 2023-02-13 ENCOUNTER — Encounter (HOSPITAL_COMMUNITY)
Admission: EM | Disposition: A | Discharge status: Home / Self Care | Payer: Self-pay | Source: Home / Self Care | Attending: Internal Medicine

## 2023-02-13 DIAGNOSIS — M1A9XX0 Chronic gout, unspecified, without tophus (tophi): Secondary | ICD-10-CM

## 2023-02-13 DIAGNOSIS — N1832 Chronic kidney disease, stage 3b: Secondary | ICD-10-CM | POA: Diagnosis not present

## 2023-02-13 DIAGNOSIS — I2510 Atherosclerotic heart disease of native coronary artery without angina pectoris: Secondary | ICD-10-CM | POA: Diagnosis not present

## 2023-02-13 DIAGNOSIS — E1159 Type 2 diabetes mellitus with other circulatory complications: Secondary | ICD-10-CM | POA: Diagnosis not present

## 2023-02-13 DIAGNOSIS — I10 Essential (primary) hypertension: Secondary | ICD-10-CM | POA: Diagnosis not present

## 2023-02-13 DIAGNOSIS — J9611 Chronic respiratory failure with hypoxia: Secondary | ICD-10-CM | POA: Diagnosis not present

## 2023-02-13 DIAGNOSIS — F32A Depression, unspecified: Secondary | ICD-10-CM | POA: Diagnosis not present

## 2023-02-13 DIAGNOSIS — Z794 Long term (current) use of insulin: Secondary | ICD-10-CM

## 2023-02-13 DIAGNOSIS — I214 Non-ST elevation (NSTEMI) myocardial infarction: Secondary | ICD-10-CM | POA: Diagnosis not present

## 2023-02-13 DIAGNOSIS — G894 Chronic pain syndrome: Secondary | ICD-10-CM

## 2023-02-13 HISTORY — PX: LEFT HEART CATH AND CORONARY ANGIOGRAPHY: CATH118249

## 2023-02-13 HISTORY — PX: CORONARY STENT INTERVENTION: CATH118234

## 2023-02-13 HISTORY — PX: CORONARY ULTRASOUND/IVUS: CATH118244

## 2023-02-13 LAB — GLUCOSE, CAPILLARY
Glucose-Capillary: 334 mg/dL — ABNORMAL HIGH (ref 70–99)
Glucose-Capillary: 213 mg/dL — ABNORMAL HIGH (ref 70–99)
Glucose-Capillary: 193 mg/dL — ABNORMAL HIGH (ref 70–99)
Glucose-Capillary: 209 mg/dL — ABNORMAL HIGH (ref 70–99)
Glucose-Capillary: 340 mg/dL — ABNORMAL HIGH (ref 70–99)
Glucose-Capillary: 290 mg/dL — ABNORMAL HIGH (ref 70–99)
Glucose-Capillary: 291 mg/dL — ABNORMAL HIGH (ref 70–99)

## 2023-02-13 LAB — POCT ACTIVATED CLOTTING TIME
Activated Clotting Time: 305 seconds
Activated Clotting Time: 336 s
Activated Clotting Time: 250 seconds
Activated Clotting Time: 201 seconds
Activated Clotting Time: 183 seconds
Activated Clotting Time: 183 seconds

## 2023-02-13 LAB — HEPARIN LEVEL (UNFRACTIONATED): Heparin Unfractionated: 0.23 IU/mL — ABNORMAL LOW (ref 0.30–0.70)

## 2023-02-13 SURGERY — LEFT HEART CATH AND CORONARY ANGIOGRAPHY
Anesthesia: LOCAL

## 2023-02-13 MED ORDER — FUROSEMIDE 10 MG/ML IJ SOLN
40.0000 mg | Freq: Once | INTRAMUSCULAR | Status: AC
  Administered 2023-02-13: 40 mg via INTRAVENOUS
  Filled 2023-02-13: qty 4

## 2023-02-13 MED ORDER — HEPARIN BOLUS VIA INFUSION
2000.0000 [IU] | Freq: Once | INTRAVENOUS | Status: AC
  Administered 2023-02-13: 2000 [IU] via INTRAVENOUS
  Filled 2023-02-13: qty 2000

## 2023-02-13 MED ORDER — NITROGLYCERIN 1 MG/10 ML FOR IR/CATH LAB
INTRA_ARTERIAL | Status: DC | PRN
  Administered 2023-02-13: 200 ug via INTRACORONARY

## 2023-02-13 MED ORDER — MIDAZOLAM HCL 2 MG/2ML IJ SOLN
INTRAMUSCULAR | Status: AC
  Filled 2023-02-13: qty 2

## 2023-02-13 MED ORDER — IOHEXOL 350 MG/ML SOLN
INTRAVENOUS | Status: DC | PRN
  Administered 2023-02-13: 80 mL

## 2023-02-13 MED ORDER — LIDOCAINE HCL (PF) 1 % IJ SOLN
INTRAMUSCULAR | Status: AC
  Filled 2023-02-13: qty 30

## 2023-02-13 MED ORDER — SODIUM CHLORIDE 0.9 % IV SOLN: INTRAVENOUS | Status: AC

## 2023-02-13 MED ORDER — HYDRALAZINE HCL 20 MG/ML IJ SOLN: 10.0000 mg | INTRAMUSCULAR | Status: AC | PRN

## 2023-02-13 MED ORDER — FENTANYL CITRATE (PF) 100 MCG/2ML IJ SOLN
INTRAMUSCULAR | Status: DC | PRN
  Administered 2023-02-13 (×2): 25 ug via INTRAVENOUS

## 2023-02-13 MED ORDER — HEPARIN SODIUM (PORCINE) 1000 UNIT/ML IJ SOLN
INTRAMUSCULAR | Status: DC | PRN
  Administered 2023-02-13: 10000 [IU] via INTRAVENOUS

## 2023-02-13 MED ORDER — HEPARIN (PORCINE) IN NACL 1000-0.9 UT/500ML-% IV SOLN
INTRAVENOUS | Status: DC | PRN
  Administered 2023-02-13 (×2): 500 mL

## 2023-02-13 MED ORDER — INSULIN ASPART 100 UNIT/ML IJ SOLN
INTRAMUSCULAR | Status: AC
  Filled 2023-02-13: qty 1

## 2023-02-13 MED ORDER — MIDAZOLAM HCL 2 MG/2ML IJ SOLN
INTRAMUSCULAR | Status: DC | PRN
  Administered 2023-02-13: 1 mg via INTRAVENOUS

## 2023-02-13 MED ORDER — HEPARIN SODIUM (PORCINE) 1000 UNIT/ML IJ SOLN
INTRAMUSCULAR | Status: AC
  Filled 2023-02-13: qty 10

## 2023-02-13 MED ORDER — FENTANYL CITRATE (PF) 100 MCG/2ML IJ SOLN
INTRAMUSCULAR | Status: AC
  Filled 2023-02-13: qty 2

## 2023-02-13 MED ORDER — LIDOCAINE HCL (PF) 1 % IJ SOLN
INTRAMUSCULAR | Status: DC | PRN
  Administered 2023-02-13: 15 mL

## 2023-02-13 MED ORDER — NITROGLYCERIN 1 MG/10 ML FOR IR/CATH LAB
INTRA_ARTERIAL | Status: AC
  Filled 2023-02-13: qty 10

## 2023-02-13 MED ORDER — SODIUM CHLORIDE 0.9 % IV SOLN: INTRAVENOUS | Status: DC

## 2023-02-13 SURGICAL SUPPLY — 18 items
BALL SAPPHIRE NC24 3.0X12 (BALLOONS) ×1
BALLN ~~LOC~~ EMERGE MR 2.75X12 (BALLOONS) ×1
CATH INFINITI 5FR MULTPACK ANG (CATHETERS) ×1
CATH OPTICROSS HD (CATHETERS) ×1
CATH VISTA GUIDE 6FR XBLAD3.5 (CATHETERS) ×1
KIT ENCORE 26 ADVANTAGE (KITS) ×1
KIT MICROPUNCTURE NIT STIFF (SHEATH) ×1
PACK CARDIAC CATHETERIZATION (CUSTOM PROCEDURE TRAY) ×1
PROTECTION STATION PRESSURIZED (MISCELLANEOUS) ×1
SET ATX-X65L (MISCELLANEOUS) ×1
SHEATH PINNACLE 5F 10CM (SHEATH) ×1
SHEATH PINNACLE 6F 10CM (SHEATH) ×1
SHEATH PROBE COVER 6X72 (BAG) ×1
SLED PULL BACK IVUS (MISCELLANEOUS) ×1
STENT ONYX FRONTIER 2.75X22 (Permanent Stent) ×1 IMPLANT
TUBING CIL FLEX 10 FLL-RA (TUBING) ×1
WIRE COUGAR XT STRL 190CM (WIRE) ×1
WIRE EMERALD 3MM-J .035X150CM (WIRE) ×1

## 2023-02-13 NOTE — Progress Notes (Signed)
Dr Clifton James notified of continued chest pressure 5/10 and low O2 saturation. Ordered to increase nitroglycerin to [employer]

## 2023-02-13 NOTE — TOC Initial Note (Signed)
Transition of Care [facility]) - Initial/Assessment Note    Patient Details  Name: Dana [name] MRN: [id number] Date of Birth: 03-21-1961  Transition of Care [facility]) CM/SW Contact:    Harriet Masson, RN Phone Number: 02/13/2023, 2:54 PM  Clinical Narrative:                 Spoke to patient regarding transition needs.  Paitent lives with spouse and has home 02 concentrator only at night from [facility]. Patient does NOT have portable tank. If patient needs home 02 at discharge a new order will need to be sent to [facility] for continuous 02. Patient's family can provide transportation.  Address, Phone number and PCP verified.  Expected Discharge Plan: Home/Self Care Barriers to Discharge: Continued Medical Work up   Patient Goals and CMS Choice Patient states their goals for this hospitalization and ongoing recovery are:: return home          Expected Discharge Plan and Services       Living arrangements for the past 2 months: Single Family Home                                      Prior Living Arrangements/Services Living arrangements for the past 2 months: Single Family Home Lives with:: Spouse                   Activities of Daily Living Home Assistive Devices/Equipment: CPAP, Eyeglasses, Oxygen, Shower chair with back, Environmental consultant (specify type), Wheelchair, Other (Comment) ADL Screening (condition at time of admission) Patient's cognitive ability adequate to safely complete daily activities?: Yes Is the patient deaf or have difficulty hearing?: No Does the patient have difficulty seeing, even when wearing glasses/contacts?: No Does the patient have difficulty concentrating, remembering, or making decisions?: No Patient able to express need for assistance with ADLs?: Yes Does the patient have difficulty dressing or bathing?: No Independently performs ADLs?: Yes (appropriate for developmental age) Does the patient have difficulty  walking or climbing stairs?: Yes Weakness of Legs: Both Weakness of Arms/Hands: None  Permission Sought/Granted                  Emotional Assessment              Admission diagnosis:  Hyperglycemia [R73.9] NSTEMI (non-ST elevated myocardial infarction) [facility]) [I21.4] Patient Active Problem List   Diagnosis Date Noted   Chest pain 12/07/2022   Chronic kidney disease, stage 4 (severe) (HCC) 09/30/2022   Dark urine 09/22/2022   Unstable angina (HCC) 09/07/2022   Chronic idiopathic constipation 09/01/2022   Hyperphosphatemia 01/09/2022   Genetic testing 11/07/2021   Family history of breast cancer 10/23/2021   GAD (generalized anxiety disorder) 07/11/2021   Mixed hyperlipidemia 07/11/2021   S/P mitral valve clip implantation 05/18/2021   History of cardiac arrest 02/06/2021   Impaired mobility and ADLs 01/20/2021   Left-sided weakness 01/20/2021   Drug-induced myopathy 01/03/2021   Secondary hyperparathyroidism of renal origin (HCC) 11/18/2020   Vitamin D insufficiency 11/18/2020   CKD stage 3b, GFR 30-44 ml/min (HCC) 10/13/2020   NSTEMI (non-ST elevated myocardial infarction) (HCC) 09/03/2020   Contrast dye induced nephropathy, possible 09/03/2020   OSA on CPAP 09/03/2020   Insulin dependent type 2 diabetes mellitus (HCC) 09/03/2020   Need for COVID-19 vaccine 06/28/2020   Medicare annual wellness visit, subsequent 06/13/2020   Chronic respiratory failure with  hypoxia (HCC) 03/23/2020   Chronic gout without tophus 03/23/2020   Pain in both hands 03/23/2020   Uncomplicated opioid dependence (HCC) 03/23/2020   Depression, major, recurrent, mild (HCC) 12/21/2019   Hypomagnesemia 12/21/2019   Lumbar back pain 10/05/2019   Diabetic nephropathy associated with type 2 diabetes mellitus (HCC) 10/05/2019   Hypertensive heart and renal disease with congestive heart failure (HCC) 09/18/2019   Demand ischemia    Chronic heart failure with preserved ejection fraction (HFpEF)  (HCC) 11/07/2017   Class 2 severe obesity due to excess calories with serious comorbidity and body mass index (BMI) of 39.0 to 39.9 in adult [facility]) 11/07/2017   Coronary artery disease of native artery of native heart with stable angina pectoris (HCC) 12/25/2016   Hyperlipidemia LDL goal <70 12/14/2016   Anemia in stage 4 chronic kidney disease (HCC) 03/23/2016   CAD (coronary artery disease) 03/23/2016   Chronic pain syndrome 03/23/2016   GERD without esophagitis 03/23/2016   On home oxygen therapy 03/23/2016   S/P ablation of atrial fibrillation 10/04/2015   Diabetic polyneuropathy associated with type 2 diabetes mellitus (HCC) 04/02/2014   PCP:  Blane Ohara, MD Pharmacy:   [facility] - [place], [place] - [address] [place] [address] Phone: [phone] Fax: [phone]  [facility] - [place], [place] - [address] [place] [address] Phone: [phone] Fax: [phone]     Social Determinants of Health (SDOH) Social History: SDOH Screenings   Food Insecurity: No Food Insecurity (02/13/2023)  Housing: Low Risk  (02/13/2023)  Transportation Needs: No Transportation Needs (02/13/2023)  Utilities: Not At Risk (02/13/2023)  Alcohol Screen: Low Risk  (07/25/2022)  Depression (PHQ2-9): Medium Risk (02/05/2023)  Financial Resource Strain: Medium Risk (10/30/2022)  Physical Activity: Inactive (02/05/2023)  Social Connections: Moderately Integrated (07/25/2022)  Stress: Stress Concern Present (02/05/2023)  Tobacco Use: Medium Risk (02/12/2023)  Health Literacy: Adequate Health Literacy (01/30/2023)   SDOH Interventions:     Readmission Risk Interventions    12/16/2022   11:54 AM 05/19/2021    1:11 PM 10/06/2020    2:45 PM  Readmission Risk Prevention Plan  Transportation Screening Complete Complete Complete  PCP or Specialist Appt within 3-5 Days   Complete  Social Work Consult for  Recovery Care Planning/Counseling   Complete  Palliative Care Screening   Not Applicable  Medication Review [employer]) Referral to Pharmacy Complete Complete  PCP or Specialist appointment within 3-5 days of discharge Complete Complete   HRI or Home Care Consult Complete Complete   SW Recovery Care/Counseling Consult Complete Complete   Palliative Care Screening Not Applicable Not Applicable   Skilled Nursing Facility Not Applicable Not Applicable

## 2023-02-13 NOTE — Progress Notes (Signed)
Mobility Specialist Progress Note:   02/13/23 1105  Mobility  Activity Ambulated with assistance in hallway  Level of Assistance Contact guard assist, steadying assist  Assistive Device Front wheel walker  Distance Ambulated (ft) 80 ft  Activity Response Tolerated well  Mobility Referral Yes  $Mobility charge 1 Mobility  Mobility Specialist Start Time (ACUTE ONLY) 1040  Mobility Specialist Stop Time (ACUTE ONLY) 1100  Mobility Specialist Time Calculation (min) (ACUTE ONLY) 20 min    Pre Mobility: 81 HR , 92% SpO2 RA During Mobility: 90 HR  , 92% SpO2 RA Post Mobility: 78 HR , 93% SpO2 RA  Pt received in bed, agreeable to mobility. Independent to stand. CG during ambulation. Pt c/o lower back pain, SOB, and slight lightheadedness during ambulation. Pursed lip breathing encouraged. Pt returned to EOB with RN and MD present in room.     Leory Plowman  Mobility Specialist Please contact via [employer] office at [phone]

## 2023-02-13 NOTE — Interval H&P Note (Signed)
History and Physical Interval Note:  02/13/2023 2:03 PM  Dana [name]  has presented today for surgery, with the diagnosis of nstemi.  The various methods of treatment have been discussed with the patient and family. After consideration of risks, benefits and other options for treatment, the patient has consented to  Procedure(s): LEFT HEART CATH AND CORONARY ANGIOGRAPHY (N/A) as a surgical intervention.  The patient's history has been reviewed, patient examined, no change in status, stable for surgery.  I have reviewed the patient's chart and labs.  Questions were answered to the patient's satisfaction.    Cath Lab Visit (complete for each Cath Lab visit)  Clinical Evaluation Leading to the Procedure:   ACS: Yes.    Non-ACS:    Anginal Classification: CCS III  Anti-ischemic medical therapy: Maximal Therapy (2 or more classes of medications)  Non-Invasive Test Results: No non-invasive testing performed  Prior CABG: No previous CABG        [name]

## 2023-02-13 NOTE — Progress Notes (Signed)
Dr. Excell Seltzer paged and informed of ACT 183  at 1806 and again at 1844. Given permission to pull.

## 2023-02-13 NOTE — Progress Notes (Signed)
PROGRESS NOTE    Dana [name]  ZOX:[id number] DOB: [birth year] DOA: 02/12/2023 PCP: Blane Ohara, MD    Chief Complaint  Patient presents with   Chest Pain    Brief Narrative:  Patient is a 62 year old female history of anxiety, chronic low back pain, CKD 3B, OSA, chronic hypoxic respiratory failure, IDDM, complex cardiac history of multiple PCI procedures and complications including contrast-induced nephropathy presented to the ED with chest pain. -Patient noted to have substernal chest pressure with ambulation ongoing despite recent increase in her isosorbide.  Patient took 3 nitroglycerin prior to presentation with ongoing symptoms.  Patient seen in the ED noted to have sats in the mid 90s on 4 L nasal cannula, EKG showed normal sinus rhythm right axis deviation, chest x-ray with cardiomegaly and chronic interstitial thickening.  Troponin noted to be elevated at 107 increased to 941. -Patient seen in consultation by cardiology, patient placed on a heparin drip and nitroglycerin drips, and cardiology recommended cardiac catheterization 02/13/2023 for further evaluation.   Assessment & Plan:   Principal Problem:   NSTEMI (non-ST elevated myocardial infarction) (HCC) Active Problems:   Chronic heart failure with preserved ejection fraction (HFpEF) (HCC)   Chronic respiratory failure with hypoxia (HCC)   Uncomplicated opioid dependence (HCC)   OSA on CPAP   Insulin dependent type 2 diabetes mellitus (HCC)   CKD stage 3b, GFR 30-44 ml/min (HCC)   GAD (generalized anxiety disorder)  #1 Non-STEMI -Patient presented with chest pain on exertion with no improvement despite recent increase in isosrbide, as well as nitroglycerin. -Patient with some symptomatic improvement on IV heparin and IV nitroglycerin drip. -Due to patient's complex cardiac history cardiology consulted who assessed the patient and recommended cardiac catheterization today 02/13/2023. -Continue IV heparin, IV  nitroglycerin drip, Coreg, aspirin, Brilinta. -Per cardiology.  2.  Dyspnea -, Likely secondary to IV fluids patient was placed on with gentle hydration in addition to holding her diuretics. -IV fluids saline lock this morning per cardiology recommendations and Lasix 40 mg IV x 1 given. -Patient for cardiac catheterization today 02/13/2023.  3.  Insulin-dependent diabetes mellitus 1 -Hemoglobin A1c 8.5 (02/13/2023). -CBG 193 this morning. -Patient currently n.p.o. for cardiac catheterization later on today. -Patient noted to be on sliding scale NovoLog, Levemir 55 units daily prior to admission. -Continue current dose of Semglee 25 units daily, SSI and uptitrate Semglee back to home dose once patient on the oral diet and no further procedures planned.  4.  CKD stage IIIb -Close to baseline. -Patient noted to have multiple contrast-induced nephropathy post cath in the past. -Patient was placed on gentle hydration and torsemide held in anticipation of cardiac catheterization. -Patient with dyspnea on exertion this morning as such IV fluids saline locked and patient given a dose of IV Lasix x 1 per cardiology. -Renal function closely and if worsening will need nephrology input.  5.  General anxiety disorder -Continue BuSpar, Ativan.  6.  Chronic back pain -Continue home regimen Norco.  7.  OSA/chronic hypoxic respiratory failure -CPAP nightly, supplemental O2.  8.  Chronic HFpEF -Compensated on presentation. -Torsemide held and patient started on gentle hydration in anticipation of cardiac catheterization today, patient however with some dyspnea on exertion and as such IV fluids saline locked and Lasix 40 mg IV x 1 ordered per cardiology. -Per cardiology.  9.  Moderate MR status post transcatheter edge-to-edge repair of mitral valve -Stable. -Per cardiology.  10.  Gout -Continue allopurinol. -   DVT prophylaxis: Heparin drip Code  Status: Full Family Communication: Updated  patient, mother, daughter at bedside. Disposition: TBD  Status is: Inpatient Remains inpatient appropriate because: Severity of illness   Consultants:  Cardiology: Dr. Excell Seltzer 02/12/2023  Procedures:  Chest x-ray 02/12/2023 Cardiac catheterization pending 02/13/2023  Antimicrobials:  Anti-infectives (From admission, onward)    Start     Dose/Rate Route Frequency Ordered Stop   02/12/23 2200  [MAR Hold]  cephALEXin (KEFLEX) capsule 250 mg        (MAR Hold since Wed 02/13/2023 at 1401.Hold Reason: Transfer to a Procedural area)   250 mg Oral Daily at bedtime 02/12/23 1941           Subjective: Patient noted to be ambulating in the hallway with mobility specialist.  Patient with some complaints of shortness of breath over the past 24 hours and with ambulation.  Denies any chest pain since being placed on nitroglycerin drip.  Mother, daughter, granddaughter at bedside.  Objective: Vitals:   02/13/23 0300 02/13/23 0420 02/13/23 0700 02/13/23 0824  BP:  (!) 134/54 (!) 124/54 (!) 131/50  Pulse:  85 84 91  Resp:  20 17   Temp:  98 F (36.7 C) 97.8 F (36.6 C)   TempSrc:  Oral Oral   SpO2:  96% 93%   Weight: 104.6 kg     Height:        Intake/Output Summary (Last 24 hours) at 02/13/2023 1108 Last data filed at 02/13/2023 0835 Gross per 24 hour  Intake 427.72 ml  Output 650 ml  Net -222.28 ml   Filed Weights   02/12/23 1455 02/12/23 2344 02/13/23 0300  Weight: 99.3 kg 104.6 kg 104.6 kg    Examination:  General exam: Appears calm and comfortable  Respiratory system: Diffuse crackles.  No wheezing.  Fair air movement.  Speaking in full sentences.  Cardiovascular system: S1 & S2 heard, RRR.  Positive JVD.  No murmurs, rubs, gallops or clicks.  Trace bilateral lower extremity edema.  Gastrointestinal system: Abdomen is nondistended, soft and nontender. No organomegaly or masses felt. Normal bowel sounds heard. Central nervous system: Alert and oriented. No focal neurological  deficits. Extremities: Trace bilateral lower extremity edema.  Symmetric 5 x 5 power. Skin: No rashes, lesions or ulcers Psychiatry: Judgement and insight appear normal. Mood & affect appropriate.     Data Reviewed: I have personally reviewed following labs and imaging studies  CBC: Recent Labs  Lab 02/12/23 1501 02/13/23 0202  WBC 15.5* 13.1*  HGB 10.4* 9.5*  HCT 33.0* 30.0*  MCV 86.4 85.7  PLT 243 228    Basic Metabolic Panel: Recent Labs  Lab 02/12/23 1501 02/13/23 0202  NA 135 136  K 3.8 3.7  CL 100 100  CO2 21* 24  GLUCOSE 456* 338*  BUN 58* 56*  CREATININE 1.95* 1.88*  CALCIUM 8.8* 8.7*  MG  --  1.8    GFR: Estimated Creatinine Clearance: 37.5 mL/min (A) (by C-G formula based on SCr of 1.88 mg/dL (H)).  Liver Function Tests: No results for input(s): "AST", "ALT", "ALKPHOS", "BILITOT", "PROT", "ALBUMIN" in the last 168 hours.  CBG: Recent Labs  Lab 02/12/23 1744 02/12/23 2051 02/13/23 0113 02/13/23 0421 02/13/23 0727  GLUCAP 442* 354* 334* 213* 193*     Recent Results (from the past 240 hour(s))  MRSA Next Gen by PCR, Nasal     Status: None   Collection Time: 02/12/23 11:37 PM   Specimen: Nasal Mucosa; Nasal Swab  Result Value Ref Range Status   MRSA  by PCR Next Gen NOT DETECTED NOT DETECTED Final    Comment: (NOTE) The GeneXpert MRSA Assay (FDA approved for NASAL specimens only), is one component of a comprehensive MRSA colonization surveillance program. It is not intended to diagnose MRSA infection nor to guide or monitor treatment for MRSA infections. Test performance is not FDA approved in patients less than 76 years old. Performed at [facility], [address] [place], [place] [address]          Radiology Studies: DG Chest 2 View  Result Date: 02/12/2023 CLINICAL DATA:  Chest pain, shortness of breath, and pain radiating down left arm. History of CHF. EXAM: CHEST - 2 VIEW COMPARISON:  Chest radiographs 12/08/2022,  12/06/2022, 09/07/2022, 05/16/2021, 10/03/2020 FINDINGS: Cardiac silhouette is again moderately markedly enlarged. Mediastinal contours within normal limits. Mild-to-moderate bilateral interstitial thickening, greatest within the inferior lungs is not significantly changed from 05/16/2021. This is improved from most recent 12/08/2022 radiograph. Mitral valve clips and CardioMEMS left pulmonary arterial device are unchanged in position. The posterior costophrenic angles are sharp on lateral view and no definite pleural effusion is seen. There is blunting of the bilateral costophrenic angles on frontal view likely due to chronic interstitial opacification. No pneumothorax is seen. Mild dextrocurvature of the mid to upper thoracic spine with moderate multilevel disc space narrowing. IMPRESSION: 1. Moderate cardiomegaly. 2. Mild-to-moderate bilateral interstitial thickening, greatest within the inferior lungs is not significantly changed from 05/16/2021. This is improved from most recent 12/08/2022 radiograph. This may represent the patient's normal baseline and some interstitial thickening/scarring. It may represent minimal interstitial pulmonary edema, although this would be the least interstitial pulmonary edema seen on numerous comparison radiographs. Electronically Signed   By: Neita Garnet M.D.   On: 02/12/2023 17:13        Scheduled Meds:  allopurinol  100 mg Oral Daily   aspirin EC  81 mg Oral Daily   busPIRone  10 mg Oral TID   carvedilol  12.5 mg Oral BID WC   cephALEXin  250 mg Oral QHS   insulin aspart  0-6 Units Subcutaneous Q4H   insulin glargine-yfgn  25 Units Subcutaneous Daily   latanoprost  1 drop Both Eyes QHS   [START ON 02/14/2023] linaclotide  145 mcg Oral QAC breakfast   pantoprazole  40 mg Oral Daily   [START ON 02/14/2023] polyethylene glycol  17 g Oral Daily   ticagrelor  90 mg Oral BID   Continuous Infusions:  sodium chloride Stopped (02/13/23 1102)   heparin 1,200 Units/hr  (02/13/23 0701)   nitroGLYCERIN 5 mcg/min (02/13/23 0701)     LOS: 1 day    Time spent: 40 minutes    Ramiro Harvest, MD [facility]   To contact the attending provider between 7A-7P or the covering provider during after hours 7P-7A, please log into the web site [email or web address] and access using universal [place] password for that web site. If you do not have the password, please call the hospital operator.  02/13/2023, 11:08 AM

## 2023-02-13 NOTE — Progress Notes (Signed)
Sleeping

## 2023-02-13 NOTE — Progress Notes (Signed)
Rounding Note    Patient Name: Dana [name] Date of Encounter: 02/13/2023  [facility] Cardiologist: Arvilla Meres, MD   Subjective   Felt more short of breath overnight and continues to feel short of breath this morning.  Her chest pain is eased off.  She remains on IV nitroglycerin and IV heparin.  Inpatient Medications    Scheduled Meds:  allopurinol  100 mg Oral Daily   aspirin EC  81 mg Oral Daily   busPIRone  10 mg Oral TID   carvedilol  12.5 mg Oral BID WC   cephALEXin  250 mg Oral QHS   insulin aspart  0-6 Units Subcutaneous Q4H   insulin glargine-yfgn  25 Units Subcutaneous Daily   latanoprost  1 drop Both Eyes QHS   [START ON 02/14/2023] linaclotide  145 mcg Oral QAC breakfast   pantoprazole  40 mg Oral Daily   [START ON 02/14/2023] polyethylene glycol  17 g Oral Daily   ticagrelor  90 mg Oral BID   Continuous Infusions:  sodium chloride 75 mL/hr at 02/13/23 0701   heparin 1,200 Units/hr (02/13/23 0701)   lactated ringers     nitroGLYCERIN 5 mcg/min (02/13/23 0701)   PRN Meds: acetaminophen, HYDROcodone-acetaminophen, LORazepam, ondansetron (ZOFRAN) IV   Vital Signs    Vitals:   02/13/23 0300 02/13/23 0420 02/13/23 0700 02/13/23 0824  BP:  (!) 134/54 (!) 124/54 (!) 131/50  Pulse:  85 84 91  Resp:  20 17   Temp:  98 F (36.7 C) 97.8 F (36.6 C)   TempSrc:  Oral Oral   SpO2:  96% 93%   Weight: 104.6 kg     Height:        Intake/Output Summary (Last 24 hours) at 02/13/2023 0952 Last data filed at 02/13/2023 0835 Gross per 24 hour  Intake 427.72 ml  Output 650 ml  Net -222.28 ml      02/13/2023    3:00 AM 02/12/2023   11:44 PM 02/12/2023    2:55 PM  Last 3 Weights  Weight (lbs) 230 lb 9.6 oz 230 lb 9.6 oz 219 lb  Weight (kg) 104.6 kg 104.6 kg 99.338 kg      Telemetry    Normal sinus rhythm without significant arrhythmia - Personally Reviewed    Physical Exam  Alert, oriented, NAD GEN: No acute distress.   Neck: Mildly  increased jugular venous pressure Cardiac: RRR, no murmurs, rubs, or gallops.  Respiratory: Clear to auscultation bilaterally. GI: Soft, nontender, non-distended  MS: Trace bilateral pretibial and ankle edema; No deformity. Neuro:  Nonfocal  Psych: Normal affect   Labs    High Sensitivity Troponin:   Recent Labs  Lab 02/12/23 1501 02/12/23 1740  TROPONINIHS 107* 941*     Chemistry Recent Labs  Lab 02/12/23 1501 02/13/23 0202  NA 135 136  K 3.8 3.7  CL 100 100  CO2 21* 24  GLUCOSE 456* 338*  BUN 58* 56*  CREATININE 1.95* 1.88*  CALCIUM 8.8* 8.7*  MG  --  1.8  GFRNONAA 29* 30*  ANIONGAP 14 12    Lipids No results for input(s): "CHOL", "TRIG", "HDL", "LABVLDL", "LDLCALC", "CHOLHDL" in the last 168 hours.  Hematology Recent Labs  Lab 02/12/23 1501 02/13/23 0202  WBC 15.5* 13.1*  RBC 3.82* 3.50*  HGB 10.4* 9.5*  HCT 33.0* 30.0*  MCV 86.4 85.7  MCH 27.2 27.1  MCHC 31.5 31.7  RDW 16.3* 16.3*  PLT 243 228   Thyroid No results  for input(s): "TSH", "FREET4" in the last 168 hours.  BNPNo results for input(s): "BNP", "PROBNP" in the last 168 hours.  DDimer No results for input(s): "DDIMER" in the last 168 hours.   Radiology    DG Chest 2 View  Result Date: 02/12/2023 CLINICAL DATA:  Chest pain, shortness of breath, and pain radiating down left arm. History of CHF. EXAM: CHEST - 2 VIEW COMPARISON:  Chest radiographs 12/08/2022, 12/06/2022, 09/07/2022, 05/16/2021, 10/03/2020 FINDINGS: Cardiac silhouette is again moderately markedly enlarged. Mediastinal contours within normal limits. Mild-to-moderate bilateral interstitial thickening, greatest within the inferior lungs is not significantly changed from 05/16/2021. This is improved from most recent 12/08/2022 radiograph. Mitral valve clips and CardioMEMS left pulmonary arterial device are unchanged in position. The posterior costophrenic angles are sharp on lateral view and no definite pleural effusion is seen. There is  blunting of the bilateral costophrenic angles on frontal view likely due to chronic interstitial opacification. No pneumothorax is seen. Mild dextrocurvature of the mid to upper thoracic spine with moderate multilevel disc space narrowing. IMPRESSION: 1. Moderate cardiomegaly. 2. Mild-to-moderate bilateral interstitial thickening, greatest within the inferior lungs is not significantly changed from 05/16/2021. This is improved from most recent 12/08/2022 radiograph. This may represent the patient's normal baseline and some interstitial thickening/scarring. It may represent minimal interstitial pulmonary edema, although this would be the least interstitial pulmonary edema seen on numerous comparison radiographs. Electronically Signed   By: Neita Garnet M.D.   On: 02/12/2023 17:13    Cardiac Studies     Patient Profile     62 y.o. female with chronic diastolic heart failure, extensive CAD status post multiple PCI procedures, presenting with non-STEMI  Assessment & Plan    1.  Non-STEMI: Patient treated with IV heparin and IV nitroglycerin.  High-sensitivity troponin increased to 941.  Remains on DAPT with aspirin and ticagrelor.  Plans for cardiac catheterization today.  Limited contrast study.  Consider IVUS for intracoronary imaging if recurrent in-stent restenosis in the proximal LAD.  Gentle fluids have been started.  Creatinine with further improvement this morning 1.88, improved from baseline. 2.  Chronic kidney disease stage IIIb: Patient at very high risk of contrast nephropathy/AKI with significant contrast nephropathy in the past following cardiac catheterization.  She understands the risk/benefit of cardiac catheterization.  She is having unstable symptoms and the procedure is necessary.  Will do our best to minimize contrast.  3.  Acute on chronic diastolic heart failure: While she does not have exam evidence of marked volume overload, she clearly is more short of breath overnight and this  morning.  This is suggestive of acute on chronic diastolic heart failure.  I think we need to stop her precath hydration and give her a dose of IV furosemide in order to optimize her volume status before cath.  Will move her procedure to this afternoon.  I am concerned that she might not be able to lie flat for the procedure if we do not diurese her beforehand. 4.  Moderate mitral regurgitation status post transcatheter edge-to-edge repair of mitral valve.  Appears stable. 5.  Type 1 diabetes: Management per primary team.  Appreciate their management.      For questions or updates, please contact [facility] Please consult [email or web address] for contact info under        Signed, Tonny Bollman, MD  02/13/2023, 9:52 AM

## 2023-02-13 NOTE — H&P (View-Only) (Signed)
Rounding Note    Patient Name: Dana [name] Date of Encounter: 02/13/2023  [facility] Cardiologist: Arvilla Meres, MD   Subjective   Felt more short of breath overnight and continues to feel short of breath this morning.  Her chest pain is eased off.  She remains on IV nitroglycerin and IV heparin.  Inpatient Medications    Scheduled Meds:  allopurinol  100 mg Oral Daily   aspirin EC  81 mg Oral Daily   busPIRone  10 mg Oral TID   carvedilol  12.5 mg Oral BID WC   cephALEXin  250 mg Oral QHS   insulin aspart  0-6 Units Subcutaneous Q4H   insulin glargine-yfgn  25 Units Subcutaneous Daily   latanoprost  1 drop Both Eyes QHS   [START ON 02/14/2023] linaclotide  145 mcg Oral QAC breakfast   pantoprazole  40 mg Oral Daily   [START ON 02/14/2023] polyethylene glycol  17 g Oral Daily   ticagrelor  90 mg Oral BID   Continuous Infusions:  sodium chloride 75 mL/hr at 02/13/23 0701   heparin 1,200 Units/hr (02/13/23 0701)   lactated ringers     nitroGLYCERIN 5 mcg/min (02/13/23 0701)   PRN Meds: acetaminophen, HYDROcodone-acetaminophen, LORazepam, ondansetron (ZOFRAN) IV   Vital Signs    Vitals:   02/13/23 0300 02/13/23 0420 02/13/23 0700 02/13/23 0824  BP:  (!) 134/54 (!) 124/54 (!) 131/50  Pulse:  85 84 91  Resp:  20 17   Temp:  98 F (36.7 C) 97.8 F (36.6 C)   TempSrc:  Oral Oral   SpO2:  96% 93%   Weight: 104.6 kg     Height:        Intake/Output Summary (Last 24 hours) at 02/13/2023 0952 Last data filed at 02/13/2023 0835 Gross per 24 hour  Intake 427.72 ml  Output 650 ml  Net -222.28 ml      02/13/2023    3:00 AM 02/12/2023   11:44 PM 02/12/2023    2:55 PM  Last 3 Weights  Weight (lbs) 230 lb 9.6 oz 230 lb 9.6 oz 219 lb  Weight (kg) 104.6 kg 104.6 kg 99.338 kg      Telemetry    Normal sinus rhythm without significant arrhythmia - Personally Reviewed    Physical Exam  Alert, oriented, NAD GEN: No acute distress.   Neck: Mildly  increased jugular venous pressure Cardiac: RRR, no murmurs, rubs, or gallops.  Respiratory: Clear to auscultation bilaterally. GI: Soft, nontender, non-distended  MS: Trace bilateral pretibial and ankle edema; No deformity. Neuro:  Nonfocal  Psych: Normal affect   Labs    High Sensitivity Troponin:   Recent Labs  Lab 02/12/23 1501 02/12/23 1740  TROPONINIHS 107* 941*     Chemistry Recent Labs  Lab 02/12/23 1501 02/13/23 0202  NA 135 136  K 3.8 3.7  CL 100 100  CO2 21* 24  GLUCOSE 456* 338*  BUN 58* 56*  CREATININE 1.95* 1.88*  CALCIUM 8.8* 8.7*  MG  --  1.8  GFRNONAA 29* 30*  ANIONGAP 14 12    Lipids No results for input(s): "CHOL", "TRIG", "HDL", "LABVLDL", "LDLCALC", "CHOLHDL" in the last 168 hours.  Hematology Recent Labs  Lab 02/12/23 1501 02/13/23 0202  WBC 15.5* 13.1*  RBC 3.82* 3.50*  HGB 10.4* 9.5*  HCT 33.0* 30.0*  MCV 86.4 85.7  MCH 27.2 27.1  MCHC 31.5 31.7  RDW 16.3* 16.3*  PLT 243 228   Thyroid No results  for input(s): "TSH", "FREET4" in the last 168 hours.  BNPNo results for input(s): "BNP", "PROBNP" in the last 168 hours.  DDimer No results for input(s): "DDIMER" in the last 168 hours.   Radiology    DG Chest 2 View  Result Date: 02/12/2023 CLINICAL DATA:  Chest pain, shortness of breath, and pain radiating down left arm. History of CHF. EXAM: CHEST - 2 VIEW COMPARISON:  Chest radiographs 12/08/2022, 12/06/2022, 09/07/2022, 05/16/2021, 10/03/2020 FINDINGS: Cardiac silhouette is again moderately markedly enlarged. Mediastinal contours within normal limits. Mild-to-moderate bilateral interstitial thickening, greatest within the inferior lungs is not significantly changed from 05/16/2021. This is improved from most recent 12/08/2022 radiograph. Mitral valve clips and CardioMEMS left pulmonary arterial device are unchanged in position. The posterior costophrenic angles are sharp on lateral view and no definite pleural effusion is seen. There is  blunting of the bilateral costophrenic angles on frontal view likely due to chronic interstitial opacification. No pneumothorax is seen. Mild dextrocurvature of the mid to upper thoracic spine with moderate multilevel disc space narrowing. IMPRESSION: 1. Moderate cardiomegaly. 2. Mild-to-moderate bilateral interstitial thickening, greatest within the inferior lungs is not significantly changed from 05/16/2021. This is improved from most recent 12/08/2022 radiograph. This may represent the patient's normal baseline and some interstitial thickening/scarring. It may represent minimal interstitial pulmonary edema, although this would be the least interstitial pulmonary edema seen on numerous comparison radiographs. Electronically Signed   By: Neita Garnet M.D.   On: 02/12/2023 17:13    Cardiac Studies     Patient Profile     62 y.o. female with chronic diastolic heart failure, extensive CAD status post multiple PCI procedures, presenting with non-STEMI  Assessment & Plan    1.  Non-STEMI: Patient treated with IV heparin and IV nitroglycerin.  High-sensitivity troponin increased to 941.  Remains on DAPT with aspirin and ticagrelor.  Plans for cardiac catheterization today.  Limited contrast study.  Consider IVUS for intracoronary imaging if recurrent in-stent restenosis in the proximal LAD.  Gentle fluids have been started.  Creatinine with further improvement this morning 1.88, improved from baseline. 2.  Chronic kidney disease stage IIIb: Patient at very high risk of contrast nephropathy/AKI with significant contrast nephropathy in the past following cardiac catheterization.  She understands the risk/benefit of cardiac catheterization.  She is having unstable symptoms and the procedure is necessary.  Will do our best to minimize contrast.  3.  Acute on chronic diastolic heart failure: While she does not have exam evidence of marked volume overload, she clearly is more short of breath overnight and this  morning.  This is suggestive of acute on chronic diastolic heart failure.  I think we need to stop her precath hydration and give her a dose of IV furosemide in order to optimize her volume status before cath.  Will move her procedure to this afternoon.  I am concerned that she might not be able to lie flat for the procedure if we do not diurese her beforehand. 4.  Moderate mitral regurgitation status post transcatheter edge-to-edge repair of mitral valve.  Appears stable. 5.  Type 1 diabetes: Management per primary team.  Appreciate their management.      For questions or updates, please contact [facility] Please consult [email or web address] for contact info under        Signed, Tonny Bollman, MD  02/13/2023, 9:52 AM

## 2023-02-13 NOTE — Progress Notes (Signed)
Unable to void w/purwick. Patient is uncomfortable.I and O cathed for 900cc clear light yellow uop.

## 2023-02-13 NOTE — Progress Notes (Signed)
ANTICOAGULATION CONSULT NOTE   Pharmacy Consult for Heparin Indication: chest pain/ACS  Allergies  Allergen Reactions   Naproxen Hives and Rash   Shellfish-Derived Products Hives, Swelling and Other (See Comments)    Angioedema    Strawberry (Diagnostic) Hives   Celecoxib Other (See Comments)    Stomach pain   Glucosamine Hives, Swelling and Other (See Comments)    Angioedema    Iron     IV iron transfusion - hives - Feb 2022 hospitalization   Metoclopramide Other (See Comments)    Facial twitching and stuttering   Metolazone Other (See Comments)    Acute renal failure   Statins Other (See Comments)    Muscle pain    Patient Measurements: Height: 5\' 4"  (162.6 cm) Weight: 104.6 kg (230 lb 9.6 oz) IBW/kg (Calculated) : 54.7 Heparin Dosing Weight: 78.7 kg  Vital Signs: Temp: 97.6 F (36.4 C) (07/31 1117) Temp Source: Oral (07/31 1117) BP: 130/56 (07/31 1117) Pulse Rate: 81 (07/31 1117)  Labs: Recent Labs    02/12/23 1501 02/12/23 1740 02/13/23 0202 02/13/23 1203  HGB 10.4*  --  9.5*  --   HCT 33.0*  --  30.0*  --   PLT 243  --  228  --   HEPARINUNFRC  --   --  0.20* 0.23*  CREATININE 1.95*  --  1.88*  --   TROPONINIHS 107* 941*  --   --     Estimated Creatinine Clearance: 37.5 mL/min (A) (by C-G formula based on SCr of 1.88 mg/dL (H)).   Medical History: Past Medical History:  Diagnosis Date   Acquired absence of left great toe (HCC)    Acquired absence of other left toe(s) (HCC)    ACS (acute coronary syndrome) (HCC)    Anemia    Anoxic brain injury (HCC)    Anxiety    CAD (coronary artery disease)    DES mLAD 04/07/15; DES dRCA & DES pPDA 08/30/16; DES mCX & PTCA OM1 09/29/20   Chronic diastolic (congestive) heart failure (HCC)    Chronic pain    Chronic systolic (congestive) heart failure (HCC)    CKD (chronic kidney disease)    Contrast dye induced nephropathy, possible 09/03/2020   COPD (chronic obstructive pulmonary disease) (HCC)    Diabetes  (HCC)type 1    Diastolic CHF (HCC)    Dyspnea    Family history of breast cancer 10/23/2021   Gastro-esophageal reflux disease without esophagitis    Hyperlipidemia    Hypertension    Hypertensive heart disease with heart failure (HCC)    Hypoxemia    Idiopathic gout, multiple sites    Leukocytosis 03/29/2022   Localized edema    Low back pain    Mitral regurgitation    Mitral regurgitation    Mixed hyperlipidemia    Mixed incontinence    Morbid (severe) obesity due to excess calories (HCC)    Neuromuscular disorder (HCC)    Neuropathy    Non-ST elevation (NSTEMI) myocardial infarction (HCC)    08/29/20, 09/29/20   OSA (obstructive sleep apnea) 09/03/2020   Other specified hypothyroidism    PAF (paroxysmal atrial fibrillation) (HCC)    s/p pulmonary vein isolation by cryoablation 10/04/15 Dr. Rudolpho Sevin in [facility]   PEA (Pulseless electrical activity) [facility])    ~ 01/13/21 at [facility] during admission DKA, volume overload, possible CAP, hypoxia s/p ACLS with ROSC ~ 10-15   Pneumonia    Primary insomnia    Rheumatoid arthritis (HCC)  Stroke [facility])    Thyroid disease    Type 1 diabetes mellitus (HCC)    Vitamin D deficiency    Assessment: 70 YOF w/ PMH sig for multiple PCI procedures, HFpEF, CardioMEMS device, secondary mitral regurg w/ MitraClip. Pt presented w/ progressive chest discomfort x3 days. Pharmacy dosing heparin  -heparin level = 0.23  on 1200 units/hr -plans for cath today  Goal of Therapy:  Heparin level 0.3-0.7 units/ml Monitor platelets by anticoagulation protocol: Yes   Plan:  -Increase heparin to 1400 units.hr -Will follow plans post cath  Harland German, PharmD Clinical Pharmacist **Pharmacist phone directory can now be found on [email or web address] ([email or web address]).  Listed under [facility].  e

## 2023-02-13 NOTE — Progress Notes (Signed)
ANTICOAGULATION CONSULT NOTE - Follow Up Consult  Pharmacy Consult for heparin Indication:  NSTEMI  Labs: Recent Labs    02/12/23 1501 02/12/23 1740 02/13/23 0202  HGB 10.4*  --  9.5*  HCT 33.0*  --  30.0*  PLT 243  --  228  HEPARINUNFRC  --   --  0.20*  CREATININE 1.95*  --  1.88*  TROPONINIHS 107* 941*  --     Assessment: 62yo female subtherapeutic on heparin with initial dosing for NSTEMI; no infusion issues or signs of bleeding per RN.  Goal of Therapy:  Heparin level 0.3-0.7 units/ml   Plan:  2000 units heparin bolus. Increase heparin infusion by 3 units/kgABW/hr to 1200 units/hr. Check level in 8 hours.   Vernard Gambles, PharmD, BCPS 02/13/2023 3:38 AM

## 2023-02-13 NOTE — Inpatient Diabetes Management (Signed)
Inpatient Diabetes Program Recommendations  AACE/ADA: New Consensus Statement on Inpatient Glycemic Control (2015)  Target Ranges:  Prepandial:   less than 140 mg/dL      Peak postprandial:   less than 180 mg/dL (1-2 hours)      Critically ill patients:  140 - 180 mg/dL   Lab Results  Component Value Date   GLUCAP 193 (H) 02/13/2023   HGBA1C 8.5 (H) 02/13/2023    Review of Glycemic Control  Latest Reference Range & Units 02/13/23 01:13 02/13/23 04:21 02/13/23 07:27  Glucose-Capillary 70 - 99 mg/dL 409 (H) 811 (H) 914 (H)   Diabetes history: DM Outpatient Diabetes medications:  Novolog 2-10 units tid with meals Levemir 55 units daily Current orders for Inpatient glycemic control:  Novolog 0-6 units q 4 hours Semglee 25 units daily Inpatient Diabetes Program Recommendations:   Agree with current orders.  Will follow.   Thanks  Beryl Meager, RN, BC-ADM Inpatient Diabetes Coordinator Pager [phone]  (8a-5p)

## 2023-02-13 NOTE — Progress Notes (Signed)
Site area: rt groin 33F arterial sheath Site Prior to Removal:  Level 0 Pressure Applied For: 30 minutes Manual:   yes Patient Status During Pull:  stable Post Pull Site:  Level 0 Post Pull Instructions Given:  yes Post Pull Pulses Present: rt dp palpable Dressing Applied:  gauze and tegaderm Bedrest begins @ 1940 Comments:

## 2023-02-14 ENCOUNTER — Encounter (HOSPITAL_COMMUNITY): Payer: Self-pay | Admitting: Cardiovascular Disease

## 2023-02-14 DIAGNOSIS — E108 Type 1 diabetes mellitus with unspecified complications: Secondary | ICD-10-CM

## 2023-02-14 DIAGNOSIS — M1A9XX0 Chronic gout, unspecified, without tophus (tophi): Secondary | ICD-10-CM | POA: Diagnosis not present

## 2023-02-14 DIAGNOSIS — N1832 Chronic kidney disease, stage 3b: Secondary | ICD-10-CM | POA: Diagnosis not present

## 2023-02-14 DIAGNOSIS — I214 Non-ST elevation (NSTEMI) myocardial infarction: Secondary | ICD-10-CM | POA: Diagnosis not present

## 2023-02-14 DIAGNOSIS — I5032 Chronic diastolic (congestive) heart failure: Secondary | ICD-10-CM | POA: Diagnosis not present

## 2023-02-14 DIAGNOSIS — E1022 Type 1 diabetes mellitus with diabetic chronic kidney disease: Secondary | ICD-10-CM

## 2023-02-14 LAB — GLUCOSE, CAPILLARY
Glucose-Capillary: 197 mg/dL — ABNORMAL HIGH (ref 70–99)
Glucose-Capillary: 200 mg/dL — ABNORMAL HIGH (ref 70–99)
Glucose-Capillary: 232 mg/dL — ABNORMAL HIGH (ref 70–99)
Glucose-Capillary: 258 mg/dL — ABNORMAL HIGH (ref 70–99)
Glucose-Capillary: 321 mg/dL — ABNORMAL HIGH (ref 70–99)

## 2023-02-14 MED ORDER — INSULIN GLARGINE-YFGN 100 UNIT/ML ~~LOC~~ SOLN
30.0000 [IU] | Freq: Every day | SUBCUTANEOUS | Status: DC
  Administered 2023-02-14 – 2023-02-15 (×2): 30 [IU] via SUBCUTANEOUS
  Filled 2023-02-14 (×2): qty 0.3

## 2023-02-14 MED ORDER — SODIUM CHLORIDE 0.9 % IV SOLN: 250.0000 mL | INTRAVENOUS | Status: DC | PRN

## 2023-02-14 MED ORDER — LIVING WELL WITH DIABETES BOOK
Freq: Once | Status: AC
  Filled 2023-02-14: qty 1

## 2023-02-14 MED ORDER — POTASSIUM CHLORIDE CRYS ER 10 MEQ PO TBCR
40.0000 meq | EXTENDED_RELEASE_TABLET | Freq: Once | ORAL | Status: AC
  Administered 2023-02-14: 40 meq via ORAL
  Filled 2023-02-14: qty 4

## 2023-02-14 MED ORDER — TORSEMIDE 20 MG PO TABS
80.0000 mg | ORAL_TABLET | Freq: Every day | ORAL | Status: DC
  Administered 2023-02-14: 80 mg via ORAL
  Filled 2023-02-14: qty 4

## 2023-02-14 NOTE — Progress Notes (Signed)
SATURATION QUALIFICATIONS: (This note is used to comply with regulatory documentation for home oxygen)  Patient Saturations on Room Air at Rest = 91%  Patient Saturations on Room Air while Ambulating = 90%   

## 2023-02-14 NOTE — Progress Notes (Addendum)
PROGRESS NOTE    Dana [name]  DGU:[id number] DOB: 11-Aug-1960 DOA: 02/12/2023 PCP: Blane Ohara, MD    Chief Complaint  Patient presents with   Chest Pain    Brief Narrative:  Patient is a 62 year old female history of anxiety, chronic low back pain, CKD 3B, OSA, chronic hypoxic respiratory failure, IDDM, complex cardiac history of multiple PCI procedures and complications including contrast-induced nephropathy presented to the ED with chest pain. -Patient noted to have substernal chest pressure with ambulation ongoing despite recent increase in her isosorbide.  Patient took 3 nitroglycerin prior to presentation with ongoing symptoms.  Patient seen in the ED noted to have sats in the mid 90s on 4 L nasal cannula, EKG showed normal sinus rhythm right axis deviation, chest x-ray with cardiomegaly and chronic interstitial thickening.  Troponin noted to be elevated at 107 increased to 941. -Patient seen in consultation by cardiology, patient placed on a heparin drip and nitroglycerin drips, and cardiology recommended cardiac catheterization 02/13/2023 for further evaluation.   Assessment & Plan:   Principal Problem:   NSTEMI (non-ST elevated myocardial infarction) (HCC) Active Problems:   Chronic pain syndrome   Chronic heart failure with preserved ejection fraction (HFpEF) (HCC)   Chronic respiratory failure with hypoxia (HCC)   Chronic gout without tophus   Uncomplicated opioid dependence (HCC)   OSA on CPAP   Insulin dependent type 2 diabetes mellitus (HCC)   CKD stage 3b, GFR 30-44 ml/min (HCC)   GAD (generalized anxiety disorder)  #1 Non-STEMI -Patient presented with chest pain on exertion with no improvement despite recent increase in isosrbide, as well as nitroglycerin. -Patient with some symptomatic improvement on IV heparin and IV nitroglycerin drip. -Due to patient's complex cardiac history cardiology consulted who assessed the patient and recommended cardiac  catheterization, 02/13/2023. -Cardiac catheterization done showed severe restenosis in the proximal LAD stent status post successful IVUS guided PTCA/DES x 1 proximal LAD.  Chronic occlusion mid circumflex beyond stented segment.  Patent obtuse marginal branch with severe distal stenosis which was too small for PCI.  Dominant RCA with moderate proximal stenosis, patent distal stent noted. -Clinical improvement postcardiac catheterization. -Cardiology recommending dual antiplatelet therapy with aspirin and Brilinta.  Continue Coreg. -IV nitroglycerin drip, IV heparin discontinued per cardiology. -Per cardiology.  2.  Dyspnea -, Likely secondary to IV fluids patient was placed on with gentle hydration in addition to holding her diuretics. -Dyspnea improved after Lasix 40 mg IV x 1 given on 02/13/2023.   -Patient status post cardiac catheterization, 02/13/2023.   3.  Insulin-dependent diabetes mellitus 1 -Hemoglobin A1c 8.5 (02/13/2023). -CBG 197 this morning. -Patient placed back on a diet.  -Patient noted to be on sliding scale NovoLog, Levemir 55 units daily prior to admission. -Will increase Semglee to 30 units daily, SSI, uptitrate Semglee back to home dose if CBGs become elevated when tolerating oral intake.   4.  CKD stage IIIb -Close to baseline. -Patient noted to have multiple contrast-induced nephropathy post cath in the past. -Patient was placed on gentle hydration and torsemide held in anticipation of cardiac catheterization. -Patient with dyspnea on exertion the morning of 02/13/2023, IV fluids discontinued and patient given a dose of IV Lasix with good urine output.   -Post cath creatinine at 2.09 from 1.88.  -IV Lasix discontinued and patient to be resumed on home regimen oral torsemide.  -Monitor renal function closely as patient with history of contrast-induced nephropathy and if renal function worsens in the next 24 hours will need  renal input.   5.  General anxiety  disorder -BuSpar, Ativan.   6.  Chronic back pain -Continue home regimen Norco.  7.  OSA/chronic hypoxic respiratory failure -CPAP nightly, supplemental O2.  8.  Chronic HFpEF -Compensated on presentation. -Torsemide held and patient started on gentle hydration in anticipation of cardiac catheterization on 02/13/2023, however due to dyspnea on exertion, concern for volume overload IV fluids were discontinued and patient received a dose of Lasix 40 mg IV x 1 with a urine output of 1.550 L over the past 24 hours.   -Resume home regimen torsemide as recommended per cardiology. -Per cardiology.  9.  Moderate MR status post transcatheter edge-to-edge repair of mitral valve -Stable. -Per cardiology.  10.  Gout -Allopurinol.    DVT prophylaxis: Heparin drip Code Status: Full Family Communication: Updated patient, mother, daughter at bedside. Disposition: TBD  Status is: Inpatient Remains inpatient appropriate because: Severity of illness   Consultants:  Cardiology: Dr. Excell Seltzer 02/12/2023  Procedures:  Chest x-ray 02/12/2023 Cardiac catheterization 02/13/2023-per Dr. Clifton James  Antimicrobials:  Anti-infectives (From admission, onward)    Start     Dose/Rate Route Frequency Ordered Stop   02/12/23 2200  cephALEXin (KEFLEX) capsule 250 mg        250 mg Oral Daily at bedtime 02/12/23 1941           Subjective: Sitting up in chair, mother at bedside.  Patient states shortness of breath has significantly improved since yesterday.  Denies any chest pain.  States does not feel as short of breath with ambulation today.  States she uses oxygen only at bedtime at home prior to admission.    Objective: Vitals:   02/13/23 2016 02/14/23 0004 02/14/23 0421 02/14/23 0728  BP: (!) 133/51 129/61 (!) 119/47 (!) 139/59  Pulse: 88 86 83 80  Resp: 19 17 20 17   Temp: 98.8 F (37.1 C) 98.1 F (36.7 C) 98.6 F (37 C) 98 F (36.7 C)  TempSrc: Oral Oral Oral Oral  SpO2: 94% 97% 93% 93%   Weight:   103.3 kg   Height:        Intake/Output Summary (Last 24 hours) at 02/14/2023 1103 Last data filed at 02/14/2023 0701 Gross per 24 hour  Intake 1110.73 ml  Output 1300 ml  Net -189.27 ml   Filed Weights   02/12/23 2344 02/13/23 0300 02/14/23 0421  Weight: 104.6 kg 104.6 kg 103.3 kg    Examination:  General exam: NAD. Respiratory system: Diffuse scattered crackles.  No wheezing, fair air movement.  Speaking in full sentences.  Cardiovascular system: RRR.  No JVD.  No murmurs rubs or gallops.  Trace bilateral lower extremity edema. Gastrointestinal system: Abdomen is soft, nontender, nondistended, positive bowel sounds.  No rebound.  No guarding.  Central nervous system: Alert and oriented.  Moving extremities spontaneously.  No focal neurological deficits. Extremities: Trace bilateral lower extremity edema.  Symmetric 5 x 5 power. Skin: No rashes, lesions or ulcers Psychiatry: Judgement and insight appear normal. Mood & affect appropriate.     Data Reviewed: I have personally reviewed following labs and imaging studies  CBC: Recent Labs  Lab 02/12/23 1501 02/13/23 0202 02/14/23 0247  WBC 15.5* 13.1* 13.0*  HGB 10.4* 9.5* 9.4*  HCT 33.0* 30.0* 29.4*  MCV 86.4 85.7 86.0  PLT 243 228 241    Basic Metabolic Panel: Recent Labs  Lab 02/12/23 1501 02/13/23 0202 02/14/23 0247  NA 135 136 135  K 3.8 3.7 3.3*  CL 100 100  100  CO2 21* 24 22  GLUCOSE 456* 338* 235*  BUN 58* 56* 53*  CREATININE 1.95* 1.88* 2.09*  CALCIUM 8.8* 8.7* 8.3*  MG  --  1.8  --     GFR: Estimated Creatinine Clearance: 33.5 mL/min (A) (by C-G formula based on SCr of 2.09 mg/dL (H)).  Liver Function Tests: No results for input(s): "AST", "ALT", "ALKPHOS", "BILITOT", "PROT", "ALBUMIN" in the last 168 hours.  CBG: Recent Labs  Lab 02/13/23 1843 02/13/23 2105 02/14/23 0005 02/14/23 0343 02/14/23 0729  GLUCAP 290* 291* 232* 200* 197*     Recent Results (from the past 240  hour(s))  MRSA Next Gen by PCR, Nasal     Status: None   Collection Time: 02/12/23 11:37 PM   Specimen: Nasal Mucosa; Nasal Swab  Result Value Ref Range Status   MRSA by PCR Next Gen NOT DETECTED NOT DETECTED Final    Comment: (NOTE) The GeneXpert MRSA Assay (FDA approved for NASAL specimens only), is one component of a comprehensive MRSA colonization surveillance program. It is not intended to diagnose MRSA infection nor to guide or monitor treatment for MRSA infections. Test performance is not FDA approved in patients less than 14 years old. Performed at [facility], [address], [place], [place] [address]          Radiology Studies: CARDIAC CATHETERIZATION  Result Date: 02/13/2023   Mid LAD to Dist LAD lesion is 60% stenosed.   Mid Cx to Dist Cx lesion is 100% stenosed.   1st Mrg-1 lesion is 30% stenosed.   1st Mrg-2 lesion is 90% stenosed.   Prox LAD to Mid LAD lesion is 95% stenosed.   Prox RCA lesion is 70% stenosed.   Non-stenotic Mid Cx lesion was previously treated.   Non-stenotic Dist RCA lesion was previously treated.   A drug-eluting stent was successfully placed using a STENT ONYX FRONTIER 2.75X22.   Post intervention, there is a 0% residual stenosis. Severe restenosis in the proximal LAD stent. The distal LAD is small in caliber. Successful IVUS guided PTCA/DES x 1 proximal LAD (Onyx stent platform placed over older Synergy stent) Chronic occlusion mid Circumflex beyond the stented segment. Patent obtuse marginal branch with severe distal stenosis (too small for PCI) Dominant RCA with moderate proximal stenosis, patent distal stent. LVEDP=19 mmHg Recommendations: Continue DAPT with ASA and Brilinta.   DG Chest 2 View  Result Date: 02/12/2023 CLINICAL DATA:  Chest pain, shortness of breath, and pain radiating down left arm. History of CHF. EXAM: CHEST - 2 VIEW COMPARISON:  Chest radiographs 12/08/2022, 12/06/2022, 09/07/2022, 05/16/2021, 10/03/2020 FINDINGS: Cardiac  silhouette is again moderately markedly enlarged. Mediastinal contours within normal limits. Mild-to-moderate bilateral interstitial thickening, greatest within the inferior lungs is not significantly changed from 05/16/2021. This is improved from most recent 12/08/2022 radiograph. Mitral valve clips and CardioMEMS left pulmonary arterial device are unchanged in position. The posterior costophrenic angles are sharp on lateral view and no definite pleural effusion is seen. There is blunting of the bilateral costophrenic angles on frontal view likely due to chronic interstitial opacification. No pneumothorax is seen. Mild dextrocurvature of the mid to upper thoracic spine with moderate multilevel disc space narrowing. IMPRESSION: 1. Moderate cardiomegaly. 2. Mild-to-moderate bilateral interstitial thickening, greatest within the inferior lungs is not significantly changed from 05/16/2021. This is improved from most recent 12/08/2022 radiograph. This may represent the patient's normal baseline and some interstitial thickening/scarring. It may represent minimal interstitial pulmonary edema, although this would be the  least interstitial pulmonary edema seen on numerous comparison radiographs. Electronically Signed   By: Neita Garnet M.D.   On: 02/12/2023 17:13        Scheduled Meds:  allopurinol  100 mg Oral Daily   aspirin EC  81 mg Oral Daily   busPIRone  10 mg Oral TID   carvedilol  12.5 mg Oral BID WC   cephALEXin  250 mg Oral QHS   insulin aspart  0-6 Units Subcutaneous Q4H   insulin glargine-yfgn  30 Units Subcutaneous Daily   latanoprost  1 drop Both Eyes QHS   linaclotide  145 mcg Oral QAC breakfast   living well with diabetes book   Does not apply Once   pantoprazole  40 mg Oral Daily   polyethylene glycol  17 g Oral Daily   ticagrelor  90 mg Oral BID   Continuous Infusions:  sodium chloride     nitroGLYCERIN Stopped (02/13/23 2307)     LOS: 2 days    Time spent: 40  minutes    Ramiro Harvest, MD [facility]   To contact the attending provider between 7A-7P or the covering provider during after hours 7P-7A, please log into the web site [email or web address] and access using universal [place] password for that web site. If you do not have the password, please call the hospital operator.  02/14/2023, 11:03 AM

## 2023-02-14 NOTE — Progress Notes (Signed)
Rounding Note    Patient Name: Dana [name] Date of Encounter: 02/14/2023  [facility] Cardiologist: Arvilla Meres, MD   Subjective   Feeling well today.  No recurrent chest pain.  Feels breathing is at baseline.  Inpatient Medications    Scheduled Meds:  allopurinol  100 mg Oral Daily   aspirin EC  81 mg Oral Daily   busPIRone  10 mg Oral TID   carvedilol  12.5 mg Oral BID WC   cephALEXin  250 mg Oral QHS   insulin aspart  0-6 Units Subcutaneous Q4H   insulin glargine-yfgn  25 Units Subcutaneous Daily   latanoprost  1 drop Both Eyes QHS   linaclotide  145 mcg Oral QAC breakfast   pantoprazole  40 mg Oral Daily   polyethylene glycol  17 g Oral Daily   ticagrelor  90 mg Oral BID   Continuous Infusions:  sodium chloride     nitroGLYCERIN Stopped (02/13/23 2307)   PRN Meds: sodium chloride, acetaminophen, HYDROcodone-acetaminophen, LORazepam, ondansetron (ZOFRAN) IV   Vital Signs    Vitals:   02/13/23 2016 02/14/23 0004 02/14/23 0421 02/14/23 0728  BP: (!) 133/51 129/61 (!) 119/47 (!) 139/59  Pulse: 88 86 83 80  Resp: 19 17 20 17   Temp: 98.8 F (37.1 C) 98.1 F (36.7 C) 98.6 F (37 C) 98 F (36.7 C)  TempSrc: Oral Oral Oral Oral  SpO2: 94% 97% 93% 93%  Weight:   103.3 kg   Height:        Intake/Output Summary (Last 24 hours) at 02/14/2023 0824 Last data filed at 02/14/2023 0701 Gross per 24 hour  Intake 1110.73 ml  Output 1550 ml  Net -439.27 ml      02/14/2023    4:21 AM 02/13/2023    3:00 AM 02/12/2023   11:44 PM  Last 3 Weights  Weight (lbs) 227 lb 11.8 oz 230 lb 9.6 oz 230 lb 9.6 oz  Weight (kg) 103.3 kg 104.6 kg 104.6 kg      Telemetry    Normal sinus rhythm, few brief supraventricular runs, no sustained arrhythmia - Personally Reviewed  ECG    Normal sinus rhythm 82 bpm, nonspecific ST abnormality- Personally Reviewed  Physical Exam  Alert, oriented, obese woman in no distress GEN: No acute distress.   Neck: No  JVD Cardiac: RRR, no murmurs, rubs, or gallops.  Respiratory: Clear to auscultation bilaterally. GI: Soft, nontender, non-distended  MS: Trace bilateral ankle edema; No deformity.  Right groin site clear with no hematoma or ecchymosis Neuro:  Nonfocal  Psych: Normal affect   Labs    High Sensitivity Troponin:   Recent Labs  Lab 02/12/23 1501 02/12/23 1740  TROPONINIHS 107* 941*     Chemistry Recent Labs  Lab 02/12/23 1501 02/13/23 0202 02/14/23 0247  NA 135 136 135  K 3.8 3.7 3.3*  CL 100 100 100  CO2 21* 24 22  GLUCOSE 456* 338* 235*  BUN 58* 56* 53*  CREATININE 1.95* 1.88* 2.09*  CALCIUM 8.8* 8.7* 8.3*  MG  --  1.8  --   GFRNONAA 29* 30* 27*  ANIONGAP 14 12 13     Lipids No results for input(s): "CHOL", "TRIG", "HDL", "LABVLDL", "LDLCALC", "CHOLHDL" in the last 168 hours.  Hematology Recent Labs  Lab 02/12/23 1501 02/13/23 0202 02/14/23 0247  WBC 15.5* 13.1* 13.0*  RBC 3.82* 3.50* 3.42*  HGB 10.4* 9.5* 9.4*  HCT 33.0* 30.0* 29.4*  MCV 86.4 85.7 86.0  MCH 27.2  27.1 27.5  MCHC 31.5 31.7 32.0  RDW 16.3* 16.3* 16.4*  PLT 243 228 241   Thyroid No results for input(s): "TSH", "FREET4" in the last 168 hours.  BNPNo results for input(s): "BNP", "PROBNP" in the last 168 hours.  DDimer No results for input(s): "DDIMER" in the last 168 hours.   Radiology    CARDIAC CATHETERIZATION  Result Date: 02/13/2023   Mid LAD to Dist LAD lesion is 60% stenosed.   Mid Cx to Dist Cx lesion is 100% stenosed.   1st Mrg-1 lesion is 30% stenosed.   1st Mrg-2 lesion is 90% stenosed.   Prox LAD to Mid LAD lesion is 95% stenosed.   Prox RCA lesion is 70% stenosed.   Non-stenotic Mid Cx lesion was previously treated.   Non-stenotic Dist RCA lesion was previously treated.   A drug-eluting stent was successfully placed using a STENT ONYX FRONTIER 2.75X22.   Post intervention, there is a 0% residual stenosis. Severe restenosis in the proximal LAD stent. The distal LAD is small in caliber.  Successful IVUS guided PTCA/DES x 1 proximal LAD (Onyx stent platform placed over older Synergy stent) Chronic occlusion mid Circumflex beyond the stented segment. Patent obtuse marginal branch with severe distal stenosis (too small for PCI) Dominant RCA with moderate proximal stenosis, patent distal stent. LVEDP=19 mmHg Recommendations: Continue DAPT with ASA and Brilinta.   DG Chest 2 View  Result Date: 02/12/2023 CLINICAL DATA:  Chest pain, shortness of breath, and pain radiating down left arm. History of CHF. EXAM: CHEST - 2 VIEW COMPARISON:  Chest radiographs 12/08/2022, 12/06/2022, 09/07/2022, 05/16/2021, 10/03/2020 FINDINGS: Cardiac silhouette is again moderately markedly enlarged. Mediastinal contours within normal limits. Mild-to-moderate bilateral interstitial thickening, greatest within the inferior lungs is not significantly changed from 05/16/2021. This is improved from most recent 12/08/2022 radiograph. Mitral valve clips and CardioMEMS left pulmonary arterial device are unchanged in position. The posterior costophrenic angles are sharp on lateral view and no definite pleural effusion is seen. There is blunting of the bilateral costophrenic angles on frontal view likely due to chronic interstitial opacification. No pneumothorax is seen. Mild dextrocurvature of the mid to upper thoracic spine with moderate multilevel disc space narrowing. IMPRESSION: 1. Moderate cardiomegaly. 2. Mild-to-moderate bilateral interstitial thickening, greatest within the inferior lungs is not significantly changed from 05/16/2021. This is improved from most recent 12/08/2022 radiograph. This may represent the patient's normal baseline and some interstitial thickening/scarring. It may represent minimal interstitial pulmonary edema, although this would be the least interstitial pulmonary edema seen on numerous comparison radiographs. Electronically Signed   By: Neita Garnet M.D.   On: 02/12/2023 17:13    Cardiac Studies    Cardiac catheterization study reviewed as above  Patient Profile     62 y.o. female with chronic diastolic heart failure, extensive CAD status post multiple PCI procedures, presenting with non-STEMI   Assessment & Plan    1.  Non-STEMI: Patient status post PCI yesterday with treatment of critical stenosis in her LAD with intravascular ultrasound guidance.  Chest pain-free today, did very well with her procedure with no complication.  Continue aspirin and ticagrelor. 2.  Chronic diastolic heart failure: LVEDP 19 mmHg at cath.  Patient appears euvolemic on exam.  Will resume her oral torsemide 80 mg daily today. 3.  CKD stage IIIb: Patient has had AKI/contrast nephropathy with her last 2 cardiac catheterization procedures.  Creatinine 1.88 To 2.09 today.  Too early to see changes of contrast nephropathy.  Plan to observe overnight,  repeat a creatinine tomorrow morning, and potentially discharge home tomorrow if there is not a marked change in her labs.  Again, she appears euvolemic and we need to balance her diastolic heart failure with her risk of contrast nephropathy.  With LVEDP 19 mmHg, she does not need more fluids.  Other problems currently stable.  Cardiology team will follow-up in the morning.  For questions or updates, please contact [facility] Please consult [email or web address] for contact info under        Signed, Tonny Bollman, MD  02/14/2023, 8:24 AM

## 2023-02-14 NOTE — TOC Progression Note (Addendum)
Transition of Care [facility]) - Progression Note    Patient Details  Name: Dana [name] MRN: [id number] Date of Birth: [birth year]  [facility] CM/SW Contact  Lockie Pares, RN Phone Number: 02/14/2023, 1:33 PM  Clinical Narrative:    Patient is using oxygen sporadically in hospital, her ambulatory sats are 92. RN messaged, and he stated that she is just wearing it at wore it for comfort on ambulation. Patient will have labs in the AM and may go home, no tank is needed for DC.    Expected Discharge Plan: Home/Self Care Barriers to Discharge: Continued Medical Work up  Expected Discharge Plan and Services    Home self care   Living arrangements for the past 2 months: Single Family Home                                       Social Determinants of Health (SDOH) Interventions SDOH Screenings   Food Insecurity: No Food Insecurity (02/13/2023)  Housing: Low Risk  (02/13/2023)  Transportation Needs: No Transportation Needs (02/13/2023)  Utilities: Not At Risk (02/13/2023)  Alcohol Screen: Low Risk  (07/25/2022)  Depression (PHQ2-9): Medium Risk (02/05/2023)  Financial Resource Strain: Medium Risk (10/30/2022)  Physical Activity: Inactive (02/05/2023)  Social Connections: Moderately Integrated (07/25/2022)  Stress: Stress Concern Present (02/05/2023)  Tobacco Use: Medium Risk (02/12/2023)  Health Literacy: Adequate Health Literacy (01/30/2023)    Readmission Risk Interventions    12/16/2022   11:54 AM 05/19/2021    1:11 PM 10/06/2020    2:45 PM  Readmission Risk Prevention Plan  Transportation Screening Complete Complete Complete  PCP or Specialist Appt within 3-5 Days   Complete  Social Work Consult for Recovery Care Planning/Counseling   Complete  Palliative Care Screening   Not Applicable  Medication Review Oceanographer) Referral to Pharmacy Complete Complete  PCP or Specialist appointment within 3-5 days of discharge Complete Complete   HRI or Home Care  Consult Complete Complete   SW Recovery Care/Counseling Consult Complete Complete   Palliative Care Screening Not Applicable Not Applicable   Skilled Nursing Facility Not Applicable Not Applicable

## 2023-02-14 NOTE — Plan of Care (Signed)
  Problem: Education: Goal: Understanding of CV disease, CV risk reduction, and recovery process will improve Outcome: Progressing   Problem: Activity: Goal: Ability to return to baseline activity level will improve Outcome: Progressing   Problem: Cardiovascular: Goal: Ability to achieve and maintain adequate cardiovascular perfusion will improve Outcome: Progressing Goal: Vascular access site(s) Level 0-1 will be maintained Outcome: Progressing   Problem: Coping: Goal: Ability to adjust to condition or change in health will improve Outcome: Progressing   Problem: Nutritional: Goal: Maintenance of adequate nutrition will improve Outcome: Progressing

## 2023-02-14 NOTE — Progress Notes (Addendum)
CARDIAC REHAB PHASE I   PRE:  Rate/Rhythm: 79 SR  BP:  Sitting: 139/59       SaO2: 93 2l  MODE:  Ambulation: 64 ft   AD:   RW  POST:  Rate/Rhythm: 98 SR  BP:  Sitting: 121/59      SaO2: 94 2L  Pt amb with standby assistance, pt denies CP and reports min-mod SOB during amb and was returned to room w/o complaint.   Pt walked with standby assist, pt SOB with min activity Sats stable throughout >92%  Pt remembers being visited by CR last admission, she reports doing CR at [facility] with good attendance. Pt able to recall restrictions, NTG use and calling 911, HH and diabetic diet, and ex guidelines. Will send another referral to [facility] and pt is looking forward to continuing CR. Pt returned to chair and bed sheets changed. Pt denies questions and concerns.    Faustino Congress  ACSM-CEP 10:13 AM 02/14/2023    Service time is from 0922 to 1019.

## 2023-02-14 NOTE — Progress Notes (Signed)
Mobility Specialist Progress Note:   02/14/23 1400  Oxygen Therapy  SpO2 91 %  O2 Device Room Air  Mobility  Activity Ambulated with assistance in hallway  Level of Assistance Contact guard assist, steadying assist  Assistive Device Front wheel walker  Distance Ambulated (ft) 80 ft  Activity Response Tolerated well  Mobility Referral Yes  $Mobility charge 1 Mobility  Mobility Specialist Start Time (ACUTE ONLY) 1340  Mobility Specialist Stop Time (ACUTE ONLY) 1356  Mobility Specialist Time Calculation (min) (ACUTE ONLY) 16 min    Pre Mobility: 78 HR, 91% SpO2 During Mobility: 90 HR, 90% SpO2 Post Mobility:  79 HR, 92% SpO2  Pt received in chair on RA, agreeable to mobility. Asymptomatic throughout. SpO2 in low 90s during ambulation on RA. Pt left in chair with call bell and family present.  [place]ante Earlene Plater Mobility Specialist Please contact via Special educational needs teacher or Rehab office at [phone]

## 2023-02-14 NOTE — Plan of Care (Signed)
  Problem: Education: Goal: Understanding of CV disease, CV risk reduction, and recovery process will improve Outcome: Progressing   Problem: Cardiovascular: Goal: Vascular access site(s) Level 0-1 will be maintained Outcome: Progressing   Problem: Education: Goal: Ability to describe self-care measures that may prevent or decrease complications (Diabetes Survival Skills Education) will improve Outcome: Progressing   Problem: Nutritional: Goal: Maintenance of adequate nutrition will improve Outcome: Progressing   Problem: Skin Integrity: Goal: Risk for impaired skin integrity will decrease Outcome: Progressing   Problem: Education: Goal: Understanding of cardiac disease, CV risk reduction, and recovery process will improve Outcome: Progressing   Problem: Activity: Goal: Ability to tolerate increased activity will improve Outcome: Progressing

## 2023-02-15 ENCOUNTER — Inpatient Hospital Stay (HOSPITAL_COMMUNITY): Payer: PPO

## 2023-02-15 DIAGNOSIS — I214 Non-ST elevation (NSTEMI) myocardial infarction: Secondary | ICD-10-CM | POA: Diagnosis not present

## 2023-02-15 DIAGNOSIS — N1832 Chronic kidney disease, stage 3b: Secondary | ICD-10-CM | POA: Diagnosis not present

## 2023-02-15 DIAGNOSIS — N179 Acute kidney failure, unspecified: Secondary | ICD-10-CM

## 2023-02-15 DIAGNOSIS — I5032 Chronic diastolic (congestive) heart failure: Secondary | ICD-10-CM | POA: Diagnosis not present

## 2023-02-15 DIAGNOSIS — M1A9XX0 Chronic gout, unspecified, without tophus (tophi): Secondary | ICD-10-CM | POA: Diagnosis not present

## 2023-02-15 LAB — URINALYSIS, COMPLETE (UACMP) WITH MICROSCOPIC
Bilirubin Urine: NEGATIVE
Glucose, UA: NEGATIVE mg/dL
Hgb urine dipstick: NEGATIVE
Ketones, ur: NEGATIVE mg/dL
Nitrite: NEGATIVE
Protein, ur: NEGATIVE mg/dL
Specific Gravity, Urine: 1.017 (ref 1.005–1.030)
pH: 5 (ref 5.0–8.0)

## 2023-02-15 LAB — SODIUM, URINE, RANDOM: Sodium, Ur: 27 mmol/L

## 2023-02-15 LAB — GLUCOSE, CAPILLARY
Glucose-Capillary: 108 mg/dL — ABNORMAL HIGH (ref 70–99)
Glucose-Capillary: 162 mg/dL — ABNORMAL HIGH (ref 70–99)
Glucose-Capillary: 165 mg/dL — ABNORMAL HIGH (ref 70–99)
Glucose-Capillary: 203 mg/dL — ABNORMAL HIGH (ref 70–99)
Glucose-Capillary: 222 mg/dL — ABNORMAL HIGH (ref 70–99)
Glucose-Capillary: 228 mg/dL — ABNORMAL HIGH (ref 70–99)
Glucose-Capillary: 235 mg/dL — ABNORMAL HIGH (ref 70–99)
Glucose-Capillary: 243 mg/dL — ABNORMAL HIGH (ref 70–99)

## 2023-02-15 LAB — CREATININE, URINE, RANDOM: Creatinine, Urine: 97 mg/dL

## 2023-02-15 MED ORDER — INSULIN GLARGINE-YFGN 100 UNIT/ML ~~LOC~~ SOLN
4.0000 [IU] | Freq: Once | SUBCUTANEOUS | Status: AC
  Administered 2023-02-15: 4 [IU] via SUBCUTANEOUS
  Filled 2023-02-15: qty 0.04

## 2023-02-15 MED ORDER — INSULIN GLARGINE-YFGN 100 UNIT/ML ~~LOC~~ SOLN
34.0000 [IU] | Freq: Every day | SUBCUTANEOUS | Status: DC
  Administered 2023-02-16 – 2023-02-20 (×5): 34 [IU] via SUBCUTANEOUS
  Filled 2023-02-15 (×5): qty 0.34

## 2023-02-15 NOTE — Inpatient Diabetes Management (Signed)
Inpatient Diabetes Program Recommendations  AACE/ADA: New Consensus Statement on Inpatient Glycemic Control   Target Ranges:  Prepandial:   less than 140 mg/dL      Peak postprandial:   less than 180 mg/dL (1-2 hours)      Critically ill patients:  140 - 180 mg/dL    Latest Reference Range & Units 02/14/23 07:29 02/14/23 11:05 02/14/23 15:44 02/14/23 20:06 02/15/23 00:01 02/15/23 04:53 02/15/23 07:49  Glucose-Capillary 70 - 99 mg/dL 829 (H) 562 (H) 130 (H) 286 (H) 222 (H) 162 (H) 165 (H)   Review of Glycemic Control  Diabetes history: DM1 Outpatient Diabetes medications: Levemir 55 units daily, Novolog 2-10 units TID with meals Current orders for Inpatient glycemic control: Semglee 30 units daily, Novolog 0-6 units Q4H  Inpatient Diabetes Program Recommendations:    Insulin: Please consider increasing Semglee to 34 units daily and ordering Novolog 3 units TID with meals for meal coverage if patient eats at least 50% of meals. May want to consider changing frequency of CBGs to AC&HS and Novolog 0-6 units to AC&HS.   NOTE: Patient admitted with NSTEMI on 02/12/23. Noted in Care Everywhere that patient sees Dr. Katrinka Blazing [facility]) and was last seen on 01/11/23. Per office note on 01/11/23 patient has DM1, uses FreeStyle Libre CGM, and is prescribed Levemir 55 units daily, Novolog 5-7 units with breakfast and supper, Novolog 8 units with lunch.   Thanks, Orlando Penner, RN, MSN, CDCES Diabetes Coordinator Inpatient Diabetes Program [phone] (Team Pager from 8am to 5pm)

## 2023-02-15 NOTE — Care Management Important Message (Signed)
Important Message  Patient Details  Name: Dana [name] MRN: [id number] Date of Birth: 05-Jul-1961   Medicare Important Message Given:  Yes     Dorena Bodo 02/15/2023, 2:08 PM

## 2023-02-15 NOTE — Progress Notes (Addendum)
PROGRESS NOTE    Dana [name]  UXN:[id number] DOB: Nov 27, 1960 DOA: 02/12/2023 PCP: Blane Ohara, MD    Chief Complaint  Patient presents with   Chest Pain    Brief Narrative:  Patient is a 62 year old female history of anxiety, chronic low back pain, CKD 3B, OSA, chronic hypoxic respiratory failure, IDDM, complex cardiac history of multiple PCI procedures and complications including contrast-induced nephropathy presented to the ED with chest pain. -Patient noted to have substernal chest pressure with ambulation ongoing despite recent increase in her isosorbide.  Patient took 3 nitroglycerin prior to presentation with ongoing symptoms.  Patient seen in the ED noted to have sats in the mid 90s on 4 L nasal cannula, EKG showed normal sinus rhythm right axis deviation, chest x-ray with cardiomegaly and chronic interstitial thickening.  Troponin noted to be elevated at 107 increased to 941. -Patient seen in consultation by cardiology, patient placed on a heparin drip and nitroglycerin drips, and cardiology recommended cardiac catheterization 02/13/2023 for further evaluation.   Assessment & Plan:   Principal Problem:   NSTEMI (non-ST elevated myocardial infarction) (HCC) Active Problems:   Acute renal failure superimposed on stage 3b chronic kidney disease (HCC)   Chronic pain syndrome   Chronic heart failure with preserved ejection fraction (HFpEF) (HCC)   Chronic respiratory failure with hypoxia (HCC)   Chronic gout without tophus   Uncomplicated opioid dependence (HCC)   OSA on CPAP   Insulin dependent type 2 diabetes mellitus (HCC)   CKD stage 3b, GFR 30-44 ml/min (HCC)   GAD (generalized anxiety disorder)  #1 Non-STEMI -Patient presented with chest pain on exertion with no improvement despite recent increase in isosrbide, as well as nitroglycerin. -Patient with some symptomatic improvement on IV heparin and IV nitroglycerin drip. -Due to patient's complex cardiac history  cardiology consulted who assessed the patient and recommended cardiac catheterization, 02/13/2023. -Cardiac catheterization done showed severe restenosis in the proximal LAD stent status post successful IVUS guided PTCA/DES x 1 proximal LAD.  Chronic occlusion mid circumflex beyond stented segment.  Patent obtuse marginal branch with severe distal stenosis which was too small for PCI.  Dominant RCA with moderate proximal stenosis, patent distal stent noted. -Clinical improvement postcardiac catheterization. -Cardiology recommending dual antiplatelet therapy with aspirin and Brilinta.  Continue Coreg. -IV nitroglycerin drip, IV heparin discontinued per cardiology. -Patient started back on home regimen torsemide on 02/14/2023 however due to worsening renal function torsemide will be discontinued pending nephrology evaluation. -Per cardiology.  2.  Dyspnea -, Likely secondary to IV fluids patient was placed on with gentle hydration in addition to holding her diuretics. -Dyspnea improved after Lasix 40 mg IV x 1 given on 02/13/2023.   -Patient status post cardiac catheterization, 02/13/2023.   3.  Insulin-dependent diabetes mellitus 1 -Hemoglobin A1c 8.5 (02/13/2023). -CBG 165 this morning. -Patient placed back on a diet.  -Patient noted to be on sliding scale NovoLog, Levemir 55 units daily prior to admission. -Increase Semglee to 34 units daily.  SSI.    4.  Acute kidney injury on CKD stage IIIb -Patient noted to have worsening renal function today with creatinine currently at 3.35 from 2.09 from 1.88 from 1.95 on admission.  -Baseline creatinine approximately 1.7-2. -Patient noted to have multiple contrast-induced nephropathy post cath in the past. -Patient was placed on gentle hydration and torsemide held in anticipation of cardiac catheterization on presentation. -Patient with dyspnea on exertion the morning of 02/13/2023, IV fluids discontinued and patient given a dose of IV Lasix  with good urine  output.   -Post cath creatinine at 3.35 today from 2.09 from 1.88.  -IV Lasix discontinued and patient was resumed on home regimen torsemide on 02/14/2023.   -Due to worsening renal function we will discontinue torsemide for now.   -Check a UA, urine sodium, urine creatinine, renal ultrasound, chest x-ray. -Consult with nephrology for further evaluation and management.   5.  General anxiety disorder -Continue BuSpar, Ativan.    6.  Chronic back pain -Continue home regimen Norco.  7.  OSA/chronic hypoxic respiratory failure -CPAP nightly, supplemental O2.  8.  Chronic HFpEF -Compensated on presentation. -Torsemide held and patient started on gentle hydration in anticipation of cardiac catheterization on 02/13/2023, however due to dyspnea on exertion, concern for volume overload IV fluids were discontinued and patient received a dose of Lasix 40 mg IV x 1 with a urine output of 1.550 L.   -Home regimen torsemide resumed 02/14/2023 per cardiology recommendations however due to worsening renal function will DC torsemide for now until patient has been assessed by nephrology.  -Per cardiology.  9.  Moderate MR status post transcatheter edge-to-edge repair of mitral valve -Stable. -Per cardiology.  10.  Gout -Continue allopurinol.    DVT prophylaxis: Heparin drip Code Status: Full Family Communication: Updated patient, no family at bedside.  Disposition: Home once renal function improves and patient cleared by nephrology.  Status is: Inpatient Remains inpatient appropriate because: Severity of illness   Consultants:  Cardiology: Dr. Excell Seltzer 02/12/2023 Nephrology pending  Procedures:  Chest x-ray 02/12/2023, 02/15/2023 Cardiac catheterization 02/13/2023-per Dr. Clifton James  Antimicrobials:  Anti-infectives (From admission, onward)    Start     Dose/Rate Route Frequency Ordered Stop   02/12/23 2200  cephALEXin (KEFLEX) capsule 250 mg        250 mg Oral Daily at bedtime 02/12/23 1941            Subjective: Laying in bed.  Complaining of some nausea and dry heaves this morning.  Stated just received some IV Zofran.  States had about 250 cc of urine output last night.  Denies any chest pain.  Denies any worsening significant shortness of breath however states O2 monitor was reading in the 80s so she placed a nasal cannula on this morning.  Was hoping to be able to go home today.      Objective: Vitals:   02/14/23 2356 02/15/23 0500 02/15/23 0749 02/15/23 1023  BP: (!) 134/50  133/75 (!) 158/72  Pulse: 76  75 74  Resp: 15  14   Temp: 97.9 F (36.6 C)  98.3 F (36.8 C)   TempSrc: Oral  Oral   SpO2: 96%  97%   Weight:  103.3 kg    Height:        Intake/Output Summary (Last 24 hours) at 02/15/2023 1115 Last data filed at 02/15/2023 0749 Gross per 24 hour  Intake 480 ml  Output 250 ml  Net 230 ml   Filed Weights   02/13/23 0300 02/14/23 0421 02/15/23 0500  Weight: 104.6 kg 103.3 kg 103.3 kg    Examination:  General exam: NAD. Respiratory system: Diffuse scattered crackles.  No wheezing.  Fair air movement.  Speaking in full sentences.   Cardiovascular system: Regular rate rhythm no murmurs rubs or gallops.  Trace bilateral lower extremity edema. Gastrointestinal system: Abdomen is soft, nontender, nondistended, positive bowel sounds.  No rebound.  No guarding.  Central nervous system: Alert and oriented.  Moving extremities spontaneously.  No focal neurological  deficits. Extremities: Trace bilateral lower extremity edema.  Symmetric 5 x 5 power. Skin: No rashes, lesions or ulcers Psychiatry: Judgement and insight appear normal. Mood & affect appropriate.     Data Reviewed: I have personally reviewed following labs and imaging studies  CBC: Recent Labs  Lab 02/12/23 1501 02/13/23 0202 02/14/23 0247 02/15/23 0037  WBC 15.5* 13.1* 13.0* 12.8*  HGB 10.4* 9.5* 9.4* 9.1*  HCT 33.0* 30.0* 29.4* 29.1*  MCV 86.4 85.7 86.0 86.4  PLT 243 228 241 206     Basic Metabolic Panel: Recent Labs  Lab 02/12/23 1501 02/13/23 0202 02/14/23 0247 02/15/23 0037  NA 135 136 135 134*  K 3.8 3.7 3.3* 4.2  CL 100 100 100 100  CO2 21* 24 22 22   GLUCOSE 456* 338* 235* 219*  BUN 58* 56* 53* 60*  CREATININE 1.95* 1.88* 2.09* 3.35*  CALCIUM 8.8* 8.7* 8.3* 8.7*  MG  --  1.8  --   --     GFR: Estimated Creatinine Clearance: 20.9 mL/min (A) (by C-G formula based on SCr of 3.35 mg/dL (H)).  Liver Function Tests: No results for input(s): "AST", "ALT", "ALKPHOS", "BILITOT", "PROT", "ALBUMIN" in the last 168 hours.  CBG: Recent Labs  Lab 02/14/23 1544 02/14/23 2006 02/15/23 0001 02/15/23 0453 02/15/23 0749  GLUCAP 321* 286* 222* 162* 165*     Recent Results (from the past 240 hour(s))  MRSA Next Gen by PCR, Nasal     Status: None   Collection Time: 02/12/23 11:37 PM   Specimen: Nasal Mucosa; Nasal Swab  Result Value Ref Range Status   MRSA by PCR Next Gen NOT DETECTED NOT DETECTED Final    Comment: (NOTE) The GeneXpert MRSA Assay (FDA approved for NASAL specimens only), is one component of a comprehensive MRSA colonization surveillance program. It is not intended to diagnose MRSA infection nor to guide or monitor treatment for MRSA infections. Test performance is not FDA approved in patients less than 60 years old. Performed at [facility], [address], [place], [place] [address]          Radiology Studies: CARDIAC CATHETERIZATION  Result Date: 02/13/2023   Mid LAD to Dist LAD lesion is 60% stenosed.   Mid Cx to Dist Cx lesion is 100% stenosed.   1st Mrg-1 lesion is 30% stenosed.   1st Mrg-2 lesion is 90% stenosed.   Prox LAD to Mid LAD lesion is 95% stenosed.   Prox RCA lesion is 70% stenosed.   Non-stenotic Mid Cx lesion was previously treated.   Non-stenotic Dist RCA lesion was previously treated.   A drug-eluting stent was successfully placed using a STENT ONYX FRONTIER 2.75X22.   Post intervention, there is a 0%  residual stenosis. Severe restenosis in the proximal LAD stent. The distal LAD is small in caliber. Successful IVUS guided PTCA/DES x 1 proximal LAD (Onyx stent platform placed over older Synergy stent) Chronic occlusion mid Circumflex beyond the stented segment. Patent obtuse marginal branch with severe distal stenosis (too small for PCI) Dominant RCA with moderate proximal stenosis, patent distal stent. LVEDP=19 mmHg Recommendations: Continue DAPT with ASA and Brilinta.        Scheduled Meds:  allopurinol  100 mg Oral Daily   aspirin EC  81 mg Oral Daily   busPIRone  10 mg Oral TID   carvedilol  12.5 mg Oral BID WC   cephALEXin  250 mg Oral QHS   insulin aspart  0-6 Units Subcutaneous Q4H   insulin  glargine-yfgn  30 Units Subcutaneous Daily   latanoprost  1 drop Both Eyes QHS   linaclotide  145 mcg Oral QAC breakfast   pantoprazole  40 mg Oral Daily   polyethylene glycol  17 g Oral Daily   ticagrelor  90 mg Oral BID   Continuous Infusions:  sodium chloride     nitroGLYCERIN Stopped (02/13/23 2307)     LOS: 3 days    Time spent: 40 minutes    Ramiro Harvest, MD [facility]   To contact the attending provider between 7A-7P or the covering provider during after hours 7P-7A, please log into the web site [email or web address] and access using universal [place] password for that web site. If you do not have the password, please call the hospital operator.  02/15/2023, 11:15 AM

## 2023-02-15 NOTE — Progress Notes (Signed)
Mobility Specialist Progress Note:   02/15/23 1400  Oxygen Therapy  O2 Device Nasal Cannula  O2 Flow Rate (L/min) 2 L/min  Mobility  Activity Ambulated with assistance in hallway  Level of Assistance Contact guard assist, steadying assist  Assistive Device Front wheel walker  Distance Ambulated (ft) 110 ft  Activity Response Tolerated well  Mobility Referral Yes  $Mobility charge 1 Mobility  Mobility Specialist Start Time (ACUTE ONLY) 1425  Mobility Specialist Stop Time (ACUTE ONLY) 1441  Mobility Specialist Time Calculation (min) (ACUTE ONLY) 16 min    Pre Mobility: 75 HR, 97% SpO2 During Mobility: 88 HR, 91% SpO2 Post Mobility:  82 HR, 93% SpO2  Pt received in bed, agreeable to mobility. Having a rough day emotionally, but motivated to move. Experienced some fatigue, requiring x2 standing rest breaks. Otherwise asymptomatic. Pt left in bed with call bell and family present.  D'Vante Earlene Plater Mobility Specialist Please contact via Special educational needs teacher or Rehab office at [phone]

## 2023-02-15 NOTE — Plan of Care (Signed)
  Problem: Activity: Goal: Ability to return to baseline activity level will improve Outcome: Progressing   Problem: Cardiovascular: Goal: Ability to achieve and maintain adequate cardiovascular perfusion will improve Outcome: Progressing Goal: Vascular access site(s) Level 0-1 will be maintained Outcome: Progressing   Problem: Tissue Perfusion: Goal: Adequacy of tissue perfusion will improve Outcome: Progressing   Problem: Education: Goal: Knowledge of General Education information will improve Description: Including pain rating scale, medication(s)/side effects and non-pharmacologic comfort measures Outcome: Progressing   Problem: Clinical Measurements: Goal: Ability to maintain clinical measurements within normal limits will improve Outcome: Progressing Goal: Will remain free from infection Outcome: Progressing   Problem: Nutrition: Goal: Adequate nutrition will be maintained Outcome: Progressing   Problem: Pain Managment: Goal: General experience of comfort will improve Outcome: Progressing   Problem: Safety: Goal: Ability to remain free from injury will improve Outcome: Progressing   Problem: Skin Integrity: Goal: Risk for impaired skin integrity will decrease Outcome: Progressing

## 2023-02-15 NOTE — Progress Notes (Signed)
Rounding Note    Patient Name: Dana [name] Date of Encounter: 02/15/2023  [facility] Cardiologist: Arvilla Meres, MD   Subjective   No acute events, denies chest pain or severe dyspnea.  Cr up to 3.35 this AM.  Inpatient Medications    Scheduled Meds:  allopurinol  100 mg Oral Daily   aspirin EC  81 mg Oral Daily   busPIRone  10 mg Oral TID   carvedilol  12.5 mg Oral BID WC   cephALEXin  250 mg Oral QHS   insulin aspart  0-6 Units Subcutaneous Q4H   insulin glargine-yfgn  30 Units Subcutaneous Daily   latanoprost  1 drop Both Eyes QHS   linaclotide  145 mcg Oral QAC breakfast   pantoprazole  40 mg Oral Daily   polyethylene glycol  17 g Oral Daily   ticagrelor  90 mg Oral BID   Continuous Infusions:  sodium chloride     nitroGLYCERIN Stopped (02/13/23 2307)   PRN Meds: sodium chloride, acetaminophen, HYDROcodone-acetaminophen, LORazepam, ondansetron (ZOFRAN) IV   Vital Signs    Vitals:   02/15/23 0500 02/15/23 0749 02/15/23 1023 02/15/23 1134  BP:  133/75 (!) 158/72 (!) 137/52  Pulse:  75 74 72  Resp:  14  16  Temp:  98.3 F (36.8 C)  98.2 F (36.8 C)  TempSrc:  Oral  Oral  SpO2:  97%  97%  Weight: 103.3 kg     Height:        Intake/Output Summary (Last 24 hours) at 02/15/2023 1206 Last data filed at 02/15/2023 0749 Gross per 24 hour  Intake 480 ml  Output 250 ml  Net 230 ml      02/15/2023    5:00 AM 02/14/2023    4:21 AM 02/13/2023    3:00 AM  Last 3 Weights  Weight (lbs) 227 lb 11.8 oz 227 lb 11.8 oz 230 lb 9.6 oz  Weight (kg) 103.3 kg 103.3 kg 104.6 kg      Telemetry    SR - Personally Reviewed  ECG    SR, nonspecific ST abnormality- Personally Reviewed  Physical Exam  Alert, oriented, obese woman in no distress GEN: No acute distress.   Neck: No JVD Cardiac: RRR, no murmurs, rubs, or gallops.  Respiratory: Clear to auscultation bilaterally. GI: Soft, nontender, non-distended  MS: Trace bilateral ankle edema; No  deformity.  Right groin site clear with no hematoma or ecchymosis Neuro:  Nonfocal  Psych: Normal affect   Labs    High Sensitivity Troponin:   Recent Labs  Lab 02/12/23 1501 02/12/23 1740  TROPONINIHS 107* 941*     Chemistry Recent Labs  Lab 02/13/23 0202 02/14/23 0247 02/15/23 0037  NA 136 135 134*  K 3.7 3.3* 4.2  CL 100 100 100  CO2 24 22 22   GLUCOSE 338* 235* 219*  BUN 56* 53* 60*  CREATININE 1.88* 2.09* 3.35*  CALCIUM 8.7* 8.3* 8.7*  MG 1.8  --   --   GFRNONAA 30* 27* 15*  ANIONGAP 12 13 12     Lipids No results for input(s): "CHOL", "TRIG", "HDL", "LABVLDL", "LDLCALC", "CHOLHDL" in the last 168 hours.  Hematology Recent Labs  Lab 02/13/23 0202 02/14/23 0247 02/15/23 0037  WBC 13.1* 13.0* 12.8*  RBC 3.50* 3.42* 3.37*  HGB 9.5* 9.4* 9.1*  HCT 30.0* 29.4* 29.1*  MCV 85.7 86.0 86.4  MCH 27.1 27.5 27.0  MCHC 31.7 32.0 31.3  RDW 16.3* 16.4* 16.3*  PLT 228 241 206  Thyroid No results for input(s): "TSH", "FREET4" in the last 168 hours.  BNPNo results for input(s): "BNP", "PROBNP" in the last 168 hours.  DDimer No results for input(s): "DDIMER" in the last 168 hours.   Radiology    CARDIAC CATHETERIZATION  Result Date: 02/13/2023   Mid LAD to Dist LAD lesion is 60% stenosed.   Mid Cx to Dist Cx lesion is 100% stenosed.   1st Mrg-1 lesion is 30% stenosed.   1st Mrg-2 lesion is 90% stenosed.   Prox LAD to Mid LAD lesion is 95% stenosed.   Prox RCA lesion is 70% stenosed.   Non-stenotic Mid Cx lesion was previously treated.   Non-stenotic Dist RCA lesion was previously treated.   A drug-eluting stent was successfully placed using a STENT ONYX FRONTIER 2.75X22.   Post intervention, there is a 0% residual stenosis. Severe restenosis in the proximal LAD stent. The distal LAD is small in caliber. Successful IVUS guided PTCA/DES x 1 proximal LAD (Onyx stent platform placed over older Synergy stent) Chronic occlusion mid Circumflex beyond the stented segment. Patent  obtuse marginal branch with severe distal stenosis (too small for PCI) Dominant RCA with moderate proximal stenosis, patent distal stent. LVEDP=19 mmHg Recommendations: Continue DAPT with ASA and Brilinta.    Cardiac Studies   Cardiac catheterization study reviewed as above  Patient Profile     62 y.o. female with chronic diastolic heart failure, extensive CAD status post multiple PCI procedures, presenting with non-STEMI   Assessment & Plan    1.  Non-STEMI: Continue aspirin and ticagrelor.  No statin due to intolerance 2.  Chronic diastolic heart failure: Looks relatively euvolemic today.  Hold torsemide due to AKI  Cont coreg.  Hold ARBs/SGLT2i 3.  AKI:  Patient had BP in 80s on 7/31 and dye load from PCI.  Renal consulted.  Cardiology will sign off, call with ?s.  For questions or updates, please contact [facility] Please consult [email or web address] for contact info under        Signed, Orbie Pyo, MD  02/15/2023, 12:06 PM

## 2023-02-15 NOTE — Progress Notes (Signed)
Pt nauseous at this time, does not want to ambulate at the moment. Will f/u as able.   Faustino Congress MS, ACSM-CEP 02/15/2023 11:10 AM

## 2023-02-15 NOTE — Progress Notes (Signed)
Pt refused CPAP for the night, stated her husband will bring her home unit to her in the morning.

## 2023-02-15 NOTE — Consult Note (Signed)
[facility]  HISTORY AND PHYSICAL  SHANEL PRAZAK is an 62 y.o. female.    Chief Complaint: chest pain  HPI: Pt is a 46F with a PMH sig for DM I, HTN, HLD, CAD s/p multiple PCIs, and CKD with h/o CIN who is now seen in consultation at the request of Dr Janee Morn for evaluation and recommendations surrounding AKI on CKD.    Pt presented to ED 02/12/23 for substernal chest pain.  Noted to have another NSTEMI and was placed on hep gtt and nitroglycerin gtt.  Underwent cardiac cath 02/13/23 and was found to have severe restenosis of the prox LAD stent.  Had another DES placed.    Was volume overloaded after the cath and received IV Lasix.  Home torsemide was held this AM.  In this setting we are asked to see.    Cr has uptrended from 1.9--> 1.8--> 2.09-->3.35 (02/15/23), prompting eval.    Her last AKI was in 12/2022 and was on the edge of starting HD but numbers began to improve.  Cr peaked at 4.26.    We discussed possible need for HD again if Cr continues to uptrend.  She is willing to proceed if needed.    PMH: Past Medical History:  Diagnosis Date   Acquired absence of left great toe (HCC)    Acquired absence of other left toe(s) (HCC)    ACS (acute coronary syndrome) (HCC)    Anemia    Anoxic brain injury (HCC)    Anxiety    CAD (coronary artery disease)    DES mLAD 04/07/15; DES dRCA & DES pPDA 08/30/16; DES mCX & PTCA OM1 09/29/20   Chronic diastolic (congestive) heart failure (HCC)    Chronic pain    Chronic systolic (congestive) heart failure (HCC)    CKD (chronic kidney disease)    Contrast dye induced nephropathy, possible 09/03/2020   COPD (chronic obstructive pulmonary disease) (HCC)    Diabetes (HCC)type 1    Diastolic CHF (HCC)    Dyspnea    Family history of breast cancer 10/23/2021   Gastro-esophageal reflux disease without esophagitis    Hyperlipidemia    Hypertension    Hypertensive heart disease with heart failure (HCC)    Hypoxemia    Idiopathic  gout, multiple sites    Leukocytosis 03/29/2022   Localized edema    Low back pain    Mitral regurgitation    Mitral regurgitation    Mixed hyperlipidemia    Mixed incontinence    Morbid (severe) obesity due to excess calories (HCC)    Neuromuscular disorder (HCC)    Neuropathy    Non-ST elevation (NSTEMI) myocardial infarction (HCC)    08/29/20, 09/29/20   OSA (obstructive sleep apnea) 09/03/2020   Other specified hypothyroidism    PAF (paroxysmal atrial fibrillation) (HCC)    s/p pulmonary vein isolation by cryoablation 10/04/15 Dr. Rudolpho Sevin in [facility]   PEA (Pulseless electrical activity) [facility])    ~ 01/13/21 at [facility] during admission DKA, volume overload, possible CAP, hypoxia s/p ACLS with ROSC ~ 10-15   Pneumonia    Primary insomnia    Rheumatoid arthritis (HCC)    Stroke (HCC)    Thyroid disease    Type 1 diabetes mellitus (HCC)    Vitamin D deficiency    PSH: Past Surgical History:  Procedure Laterality Date   ABDOMINAL HYSTERECTOMY     Partial- Still has both ovaries   CARDIOVASCULAR STRESS TEST  08/13/2014   Nuclear; Normal  CARPAL TUNNEL RELEASE     CATARACT EXTRACTION, BILATERAL     CESAREAN SECTION     CHOLECYSTECTOMY     CORONARY ANGIOPLASTY WITH STENT PLACEMENT     3 blockages/ 1 stent   CORONARY PRESSURE/FFR STUDY N/A 09/07/2022   Procedure: INTRAVASCULAR PRESSURE WIRE/FFR STUDY;  Surgeon: Swaziland, Peter M, MD;  Location: [facility] INVASIVE CV LAB;  Service: Cardiovascular;  Laterality: N/A;   CORONARY STENT INTERVENTION N/A 09/29/2020   Procedure: CORONARY STENT INTERVENTION;  Surgeon: Tonny Bollman, MD;  Location: [facility] INVASIVE CV LAB;  Service: Cardiovascular;  Laterality: N/A;  CFX   CORONARY STENT INTERVENTION N/A 09/07/2022   Procedure: CORONARY STENT INTERVENTION;  Surgeon: Swaziland, Peter M, MD;  Location: [facility] INVASIVE CV LAB;  Service: Cardiovascular;  Laterality: N/A;   CORONARY STENT INTERVENTION N/A 02/13/2023   Procedure: CORONARY STENT INTERVENTION;   Surgeon: Kathleene Hazel, MD;  Location: [facility] INVASIVE CV LAB;  Service: Cardiovascular;  Laterality: N/A;  LAD   CORONARY ULTRASOUND/IVUS N/A 02/13/2023   Procedure: Coronary Ultrasound/IVUS;  Surgeon: Kathleene Hazel, MD;  Location: [facility] INVASIVE CV LAB;  Service: Cardiovascular;  Laterality: N/A;   CORONARY/GRAFT ACUTE MI REVASCULARIZATION N/A 12/08/2022   Procedure: Coronary/Graft Acute MI Revascularization;  Surgeon: Marykay Lex, MD;  Location: [facility] INVASIVE CV LAB;  Service: Cardiovascular;  Laterality: N/A;   EYE SURGERY     LEFT HEART CATH AND CORONARY ANGIOGRAPHY N/A 09/29/2020   Procedure: LEFT HEART CATH AND CORONARY ANGIOGRAPHY;  Surgeon: Tonny Bollman, MD;  Location: [facility] INVASIVE CV LAB;  Service: Cardiovascular;  Laterality: N/A;   LEFT HEART CATH AND CORONARY ANGIOGRAPHY N/A 09/07/2022   Procedure: LEFT HEART CATH AND CORONARY ANGIOGRAPHY;  Surgeon: Swaziland, Peter M, MD;  Location: [facility] INVASIVE CV LAB;  Service: Cardiovascular;  Laterality: N/A;   LEFT HEART CATH AND CORONARY ANGIOGRAPHY N/A 12/08/2022   Procedure: LEFT HEART CATH AND CORONARY ANGIOGRAPHY;  Surgeon: Marykay Lex, MD;  Location: [facility] INVASIVE CV LAB;  Service: Cardiovascular;  Laterality: N/A;   LEFT HEART CATH AND CORONARY ANGIOGRAPHY N/A 02/13/2023   Procedure: LEFT HEART CATH AND CORONARY ANGIOGRAPHY;  Surgeon: Kathleene Hazel, MD;  Location: [facility] INVASIVE CV LAB;  Service: Cardiovascular;  Laterality: N/A;   MITRAL VALVE REPAIR N/A 05/18/2021   Procedure: MITRAL VALVE REPAIR;  Surgeon: Orbie Pyo, MD;  Location: [facility] INVASIVE CV LAB;  Service: Cardiovascular;  Laterality: N/A;   RIGHT HEART CATH N/A 04/27/2021   Procedure: RIGHT HEART CATH;  Surgeon: Dolores Patty, MD;  Location: [facility] INVASIVE CV LAB;  Service: Cardiovascular;  Laterality: N/A;   RIGHT/LEFT HEART CATH AND CORONARY ANGIOGRAPHY N/A 01/07/2017   Procedure: Right/Left Heart Cath and Coronary Angiography;  Surgeon: Laurey Morale, MD;  Location: [facility] INVASIVE CV LAB;  Service: Cardiovascular;  Laterality: N/A;   ROTATOR CUFF REPAIR     TEE WITHOUT CARDIOVERSION N/A 04/28/2021   Procedure: TRANSESOPHAGEAL ECHOCARDIOGRAM (TEE);  Surgeon: Dolores Patty, MD;  Location: [facility] ENDOSCOPY;  Service: Cardiovascular;  Laterality: N/A;   TEE WITHOUT CARDIOVERSION N/A 05/18/2021   Procedure: TRANSESOPHAGEAL ECHOCARDIOGRAM (TEE);  Surgeon: Orbie Pyo, MD;  Location: [facility] INVASIVE CV LAB;  Service: Cardiovascular;  Laterality: N/A;   TOE AMPUTATION Left    US ECHOCARDIOGRAPHY  05/2017   Normal    Past Medical History:  Diagnosis Date   Acquired absence of left great toe (HCC)    Acquired absence of other left toe(s) (HCC)    ACS (acute coronary syndrome) (HCC)  Anemia    Anoxic brain injury (HCC)    Anxiety    CAD (coronary artery disease)    DES mLAD 04/07/15; DES dRCA & DES pPDA 08/30/16; DES mCX & PTCA OM1 09/29/20   Chronic diastolic (congestive) heart failure (HCC)    Chronic pain    Chronic systolic (congestive) heart failure (HCC)    CKD (chronic kidney disease)    Contrast dye induced nephropathy, possible 09/03/2020   COPD (chronic obstructive pulmonary disease) (HCC)    Diabetes (HCC)type 1    Diastolic CHF (HCC)    Dyspnea    Family history of breast cancer 10/23/2021   Gastro-esophageal reflux disease without esophagitis    Hyperlipidemia    Hypertension    Hypertensive heart disease with heart failure (HCC)    Hypoxemia    Idiopathic gout, multiple sites    Leukocytosis 03/29/2022   Localized edema    Low back pain    Mitral regurgitation    Mitral regurgitation    Mixed hyperlipidemia    Mixed incontinence    Morbid (severe) obesity due to excess calories (HCC)    Neuromuscular disorder (HCC)    Neuropathy    Non-ST elevation (NSTEMI) myocardial infarction (HCC)    08/29/20, 09/29/20   OSA (obstructive sleep apnea) 09/03/2020   Other specified hypothyroidism    PAF (paroxysmal  atrial fibrillation) (HCC)    s/p pulmonary vein isolation by cryoablation 10/04/15 Dr. Rudolpho Sevin in [facility]   PEA (Pulseless electrical activity) [facility])    ~ 01/13/21 at [facility] during admission DKA, volume overload, possible CAP, hypoxia s/p ACLS with ROSC ~ 10-15   Pneumonia    Primary insomnia    Rheumatoid arthritis (HCC)    Stroke (HCC)    Thyroid disease    Type 1 diabetes mellitus (HCC)    Vitamin D deficiency     Medications:  Scheduled:  allopurinol  100 mg Oral Daily   aspirin EC  81 mg Oral Daily   busPIRone  10 mg Oral TID   carvedilol  12.5 mg Oral BID WC   cephALEXin  250 mg Oral QHS   insulin aspart  0-6 Units Subcutaneous Q4H   insulin glargine-yfgn  30 Units Subcutaneous Daily   latanoprost  1 drop Both Eyes QHS   linaclotide  145 mcg Oral QAC breakfast   pantoprazole  40 mg Oral Daily   polyethylene glycol  17 g Oral Daily   ticagrelor  90 mg Oral BID    Medications Prior to Admission  Medication Sig Dispense Refill   allopurinol (ZYLOPRIM) 300 MG tablet TAKE ONE TABLET BY MOUTH ONCE DAILY 90 tablet 0   aspirin EC 81 MG tablet Take 1 tablet (81 mg total) by mouth daily. Swallow whole. 30 tablet 12   busPIRone (BUSPAR) 10 MG tablet Take 1 tablet (10 mg total) by mouth 3 (three) times daily. 90 tablet 2   carvedilol (COREG) 12.5 MG tablet TAKE ONE TABLET BY MOUTH twice daily 180 tablet 3   cephALEXin (KEFLEX) 250 MG capsule Take 250 mg by mouth at bedtime.     CRANBERRY PO Take 2 tablets by mouth daily.     EPINEPHrine 0.3 mg/0.3 mL IJ SOAJ injection Use as directed for life-threatening allergic reaction. 4 Device 3   ezetimibe (ZETIA) 10 MG tablet Take 1 tablet (10 mg total) by mouth daily. (Patient taking differently: Take 10 mg by mouth every evening.) 90 tablet 1   HYDROcodone-acetaminophen (NORCO) 7.5-325 MG tablet Take 1  tablet by mouth 3 (three) times daily as needed. 90 tablet 0   insulin aspart (NOVOLOG FLEXPEN) 100 UNIT/ML FlexPen 2-10 U before  meals based on SSI. 15 mL 3   isosorbide mononitrate (IMDUR) 60 MG 24 hr tablet Take 1 tablet (60 mg total) by mouth daily. (Patient taking differently: Take 90 mg by mouth daily.) 30 tablet 6   latanoprost (XALATAN) 0.005 % ophthalmic solution Place 1 drop into both eyes at bedtime.     LEVEMIR FLEXPEN 100 UNIT/ML FlexPen Inject 50 units into THE SKIN daily (Patient taking differently: Inject 55 Units into the skin daily.) 15 mL 1   linaclotide (LINZESS) 145 MCG CAPS capsule TAKE ONE CAPSULE BY MOUTH BEFORE BREAKFAST 90 capsule 0   LORazepam (ATIVAN) 0.5 MG tablet TAKE ONE TABLET BY MOUTH ONCE daily AS NEEDED FOR ANXIETY 30 tablet 2   magnesium oxide (MAG-OX) 400 MG tablet Take 1 tablet (400 mg total) by mouth daily. Take 2 (400 mg) daily=800 mg (Patient taking differently: Take 800 mg by mouth daily. Take 2 (400 mg) daily=800 mg) 60 tablet 1   nitroGLYCERIN (NITROSTAT) 0.4 MG SL tablet DISSOLVE ONE TABLET UNDER THE TONGUE EVERY 5 MINUTES AS NEEDED FOR CHEST PAIN.  DO NOT EXCEED A TOTAL OF 3 DOSES IN 15 MINUTES 25 tablet 0   Omega-3 Fatty Acids (FISH OIL PO) Take 1 capsule by mouth daily.     pantoprazole (PROTONIX) 40 MG tablet TAKE ONE TABLET BY MOUTH ONCE DAILY 90 tablet 0   polyethylene glycol (MIRALAX / GLYCOLAX) 17 g packet Take 17 g by mouth daily. 14 each 0   potassium chloride SA (KLOR-CON M) 20 MEQ tablet TAKE ONE TABLET BY MOUTH ONCE DAILY (Patient taking differently: Take 20 mEq by mouth 2 (two) times a week.) 90 tablet 0   Probiotic CAPS Take 1 capsule by mouth daily.     promethazine (PHENERGAN) 25 MG tablet Take 1 tablet (25 mg total) by mouth daily as needed. (Patient taking differently: Take 25 mg by mouth daily as needed for nausea or vomiting.) 30 tablet 3   ticagrelor (BRILINTA) 90 MG TABS tablet Take 1 tablet (90 mg total) by mouth 2 (two) times daily. 60 tablet 6   torsemide (DEMADEX) 20 MG tablet TAKE 4 TABLETS BY MOUTH ONCE daily 336 tablet 3   Continuous Blood Gluc Sensor  (FREESTYLE LIBRE 2 SENSOR) MISC USE TO check blood glucose AS DIRECTED AND CHANGE sensor every 14 DAYS     Insulin Pen Needle (BD PEN NEEDLE NANO 2ND GEN) 32G X 4 MM MISC Inject 1 each into the skin in the morning, at noon, in the evening, and at bedtime. 600 each 3   OXYGEN Inhale 2 L into the lungs at bedtime.     REPATHA SURECLICK 140 MG/ML SOAJ inject 140mg  intot HEART SKIN every 14 DAYS 6 mL 1    ALLERGIES:   Allergies  Allergen Reactions   Naproxen Hives and Rash   Shellfish-Derived Products Hives, Swelling and Other (See Comments)    Angioedema    Strawberry (Diagnostic) Hives   Celecoxib Other (See Comments)    Stomach pain   Glucosamine Hives, Swelling and Other (See Comments)    Angioedema    Iron     IV iron transfusion - hives - Feb 2022 hospitalization   Metoclopramide Other (See Comments)    Facial twitching and stuttering   Metolazone Other (See Comments)    Acute renal failure   Statins Other (See Comments)  Muscle pain    FAM HX: Family History  Problem Relation Age of Onset   Breast cancer Mother 46       contralateral breast ca dx 72   Coronary artery disease Mother    Hypertension Mother    Hyperlipidemia Mother    Diabetes type II Mother    Lung cancer Mother 44   Diabetes Father    Hypertension Father    Mental illness Sister    Bipolar disorder Other    Depression Other    Schizophrenia Other    Cancer Other        MGF's sisters x2; unknown type    Social History:   reports that she quit smoking about 40 years ago. Her smoking use included cigarettes. She started smoking about 41 years ago. She has been exposed to tobacco smoke. She has never used smokeless tobacco. She reports that she does not drink alcohol and does not use drugs.  ROS: ROS: all other systems are negative except as per HPI  Blood pressure (!) 137/52, pulse 72, temperature 98.2 F (36.8 C), temperature source Oral, resp. rate 16, height 5\' 4"  (1.626 m), weight 103.3  kg, SpO2 97%. PHYSICAL EXAM: Physical Exam GEN NAD, sitting in bed HEENT EOMI PERRL NECK no JVD PULM normal WOB, wearing O2, some bibasilar crackles CV RRR ABD: soft EXT trace LE edema NEURO  AAO x 3   Results for orders placed or performed during the hospital encounter of 02/12/23 (from the past 48 hour(s))  POCT Activated clotting time     Status: None   Collection Time: 02/13/23  2:33 PM  Result Value Ref Range   Activated Clotting Time 336 seconds    Comment: Reference range 74-137 seconds for patients not on anticoagulant therapy.  POCT Activated clotting time     Status: None   Collection Time: 02/13/23  3:03 PM  Result Value Ref Range   Activated Clotting Time 305 seconds    Comment: Reference range 74-137 seconds for patients not on anticoagulant therapy.  POCT Activated clotting time     Status: None   Collection Time: 02/13/23  4:10 PM  Result Value Ref Range   Activated Clotting Time 250 seconds    Comment: Reference range 74-137 seconds for patients not on anticoagulant therapy.  Glucose, capillary     Status: Abnormal   Collection Time: 02/13/23  4:17 PM  Result Value Ref Range   Glucose-Capillary 340 (H) 70 - 99 mg/dL    Comment: Glucose reference range applies only to samples taken after fasting for at least 8 hours.  POCT Activated clotting time     Status: None   Collection Time: 02/13/23  5:21 PM  Result Value Ref Range   Activated Clotting Time 201 seconds    Comment: Reference range 74-137 seconds for patients not on anticoagulant therapy.  POCT Activated clotting time     Status: None   Collection Time: 02/13/23  6:06 PM  Result Value Ref Range   Activated Clotting Time 183 seconds    Comment: Reference range 74-137 seconds for patients not on anticoagulant therapy.  Glucose, capillary     Status: Abnormal   Collection Time: 02/13/23  6:43 PM  Result Value Ref Range   Glucose-Capillary 290 (H) 70 - 99 mg/dL    Comment: Glucose reference range  applies only to samples taken after fasting for at least 8 hours.  POCT Activated clotting time     Status: None   Collection  Time: 02/13/23  6:44 PM  Result Value Ref Range   Activated Clotting Time 183 seconds    Comment: Reference range 74-137 seconds for patients not on anticoagulant therapy.  Glucose, capillary     Status: Abnormal   Collection Time: 02/13/23  9:05 PM  Result Value Ref Range   Glucose-Capillary 291 (H) 70 - 99 mg/dL    Comment: Glucose reference range applies only to samples taken after fasting for at least 8 hours.   Comment 1 Notify RN    Comment 2 Document in Chart   Glucose, capillary     Status: Abnormal   Collection Time: 02/14/23 12:05 AM  Result Value Ref Range   Glucose-Capillary 232 (H) 70 - 99 mg/dL    Comment: Glucose reference range applies only to samples taken after fasting for at least 8 hours.   Comment 1 Notify RN    Comment 2 Document in Chart   CBC     Status: Abnormal   Collection Time: 02/14/23  2:47 AM  Result Value Ref Range   WBC 13.0 (H) 4.0 - 10.5 K/uL   RBC 3.42 (L) 3.87 - 5.11 MIL/uL   Hemoglobin 9.4 (L) 12.0 - 15.0 g/dL   HCT 16.1 (L) 09.6 - 04.5 %   MCV 86.0 80.0 - 100.0 fL   MCH 27.5 26.0 - 34.0 pg   MCHC 32.0 30.0 - 36.0 g/dL   RDW 40.9 (H) 81.1 - 91.4 %   Platelets 241 150 - 400 K/uL   nRBC 0.0 0.0 - 0.2 %    Comment: Performed at [facility], [address], [place], [place] [address]  Basic metabolic panel     Status: Abnormal   Collection Time: 02/14/23  2:47 AM  Result Value Ref Range   Sodium 135 135 - 145 mmol/L   Potassium 3.3 (L) 3.5 - 5.1 mmol/L   Chloride 100 98 - 111 mmol/L   CO2 22 22 - 32 mmol/L   Glucose, Bld 235 (H) 70 - 99 mg/dL    Comment: Glucose reference range applies only to samples taken after fasting for at least 8 hours.   BUN 53 (H) 6 - 20 mg/dL   Creatinine, Ser 6.21 (H) 0.44 - 1.00 mg/dL   Calcium 8.3 (L) 8.9 - 10.3 mg/dL   GFR, Estimated 27 (L) >60 mL/min    Comment:  (NOTE) Calculated using the CKD-EPI Creatinine Equation (2021)    Anion gap 13 5 - 15    Comment: Performed at [facility], [address] [place], [place] [address]  Glucose, capillary     Status: Abnormal   Collection Time: 02/14/23  3:43 AM  Result Value Ref Range   Glucose-Capillary 200 (H) 70 - 99 mg/dL    Comment: Glucose reference range applies only to samples taken after fasting for at least 8 hours.   Comment 1 Notify RN    Comment 2 Document in Chart   Glucose, capillary     Status: Abnormal   Collection Time: 02/14/23  7:29 AM  Result Value Ref Range   Glucose-Capillary 197 (H) 70 - 99 mg/dL    Comment: Glucose reference range applies only to samples taken after fasting for at least 8 hours.  Glucose, capillary     Status: Abnormal   Collection Time: 02/14/23 11:05 AM  Result Value Ref Range   Glucose-Capillary 258 (H) 70 - 99 mg/dL    Comment: Glucose reference range applies only to samples taken  after fasting for at least 8 hours.  Glucose, capillary     Status: Abnormal   Collection Time: 02/14/23  3:44 PM  Result Value Ref Range   Glucose-Capillary 321 (H) 70 - 99 mg/dL    Comment: Glucose reference range applies only to samples taken after fasting for at least 8 hours.  Glucose, capillary     Status: Abnormal   Collection Time: 02/14/23  8:06 PM  Result Value Ref Range   Glucose-Capillary 286 (H) 70 - 99 mg/dL    Comment: Glucose reference range applies only to samples taken after fasting for at least 8 hours.   Comment 1 Notify RN   Glucose, capillary     Status: Abnormal   Collection Time: 02/15/23 12:01 AM  Result Value Ref Range   Glucose-Capillary 222 (H) 70 - 99 mg/dL    Comment: Glucose reference range applies only to samples taken after fasting for at least 8 hours.   Comment 1 Notify RN   CBC     Status: Abnormal   Collection Time: 02/15/23 12:37 AM  Result Value Ref Range   WBC 12.8 (H) 4.0 - 10.5 K/uL   RBC 3.37 (L) 3.87 - 5.11 MIL/uL    Hemoglobin 9.1 (L) 12.0 - 15.0 g/dL   HCT 57.8 (L) 46.9 - 62.9 %   MCV 86.4 80.0 - 100.0 fL   MCH 27.0 26.0 - 34.0 pg   MCHC 31.3 30.0 - 36.0 g/dL   RDW 52.8 (H) 41.3 - 24.4 %   Platelets 206 150 - 400 K/uL   nRBC 0.0 0.0 - 0.2 %    Comment: Performed at [facility], [address], [place], [place] [address]  Basic metabolic panel     Status: Abnormal   Collection Time: 02/15/23 12:37 AM  Result Value Ref Range   Sodium 134 (L) 135 - 145 mmol/L   Potassium 4.2 3.5 - 5.1 mmol/L   Chloride 100 98 - 111 mmol/L   CO2 22 22 - 32 mmol/L   Glucose, Bld 219 (H) 70 - 99 mg/dL    Comment: Glucose reference range applies only to samples taken after fasting for at least 8 hours.   BUN 60 (H) 6 - 20 mg/dL   Creatinine, Ser 2.53 (H) 0.44 - 1.00 mg/dL    Comment: DELTA CHECK NOTED   Calcium 8.7 (L) 8.9 - 10.3 mg/dL   GFR, Estimated 15 (L) >60 mL/min    Comment: (NOTE) Calculated using the CKD-EPI Creatinine Equation (2021)    Anion gap 12 5 - 15    Comment: Performed at [facility], [address], [place], [place] [address]  Glucose, capillary     Status: Abnormal   Collection Time: 02/15/23  4:53 AM  Result Value Ref Range   Glucose-Capillary 162 (H) 70 - 99 mg/dL    Comment: Glucose reference range applies only to samples taken after fasting for at least 8 hours.   Comment 1 Notify RN   Glucose, capillary     Status: Abnormal   Collection Time: 02/15/23  7:49 AM  Result Value Ref Range   Glucose-Capillary 165 (H) 70 - 99 mg/dL    Comment: Glucose reference range applies only to samples taken after fasting for at least 8 hours.  Glucose, capillary     Status: Abnormal   Collection Time: 02/15/23 11:36 AM  Result Value Ref Range   Glucose-Capillary 203 (H) 70 - 99 mg/dL    Comment: Glucose  reference range applies only to samples taken after fasting for at least 8 hours.  Glucose, capillary     Status: Abnormal   Collection Time: 02/15/23  2:07 PM  Result Value Ref Range    Glucose-Capillary 228 (H) 70 - 99 mg/dL    Comment: Glucose reference range applies only to samples taken after fasting for at least 8 hours.    DG Chest 2 View  Result Date: 02/15/2023 CLINICAL DATA:  Shortness of breath EXAM: CHEST - 2 VIEW COMPARISON:  02/12/2023 and older FINDINGS: Enlarged cardiopericardial silhouette. Mitral valve clips and pulmonary arterial device in place. Vascular congestion. Question trace edema. No pneumothorax or effusion. Overlapping cardiac leads. Possible coronary stent. IMPRESSION: Enlarged heart with vascular congestion question trace edema. Electronically Signed   By: Karen Kays M.D.   On: 02/15/2023 12:18   US RENAL  Result Date: 02/15/2023 CLINICAL DATA:  Acute renal failure EXAM: RENAL / URINARY TRACT ULTRASOUND COMPLETE COMPARISON:  None Available. FINDINGS: Right Kidney: Renal measurements: 10.4 x 4.4 x 8.7 cm = volume: 112.6 mL. Echogenicity within normal limits. No mass or hydronephrosis visualized. There is an echogenic shadowing areas centrally measuring 6 mm. A small stone is possible. Left Kidney: Renal measurements: 11.9 x 4.8 x 5.1 cm = volume: 151.6 mL. Echogenicity within normal limits. No mass or hydronephrosis visualized. Bladder: Appears normal for degree of bladder distention. Other: None. IMPRESSION: No collecting system dilatation. Question small right-sided renal stone. Electronically Signed   By: Karen Kays M.D.   On: 02/15/2023 12:17   CARDIAC CATHETERIZATION  Result Date: 02/13/2023   Mid LAD to Dist LAD lesion is 60% stenosed.   Mid Cx to Dist Cx lesion is 100% stenosed.   1st Mrg-1 lesion is 30% stenosed.   1st Mrg-2 lesion is 90% stenosed.   Prox LAD to Mid LAD lesion is 95% stenosed.   Prox RCA lesion is 70% stenosed.   Non-stenotic Mid Cx lesion was previously treated.   Non-stenotic Dist RCA lesion was previously treated.   A drug-eluting stent was successfully placed using a STENT ONYX FRONTIER 2.75X22.   Post intervention, there  is a 0% residual stenosis. Severe restenosis in the proximal LAD stent. The distal LAD is small in caliber. Successful IVUS guided PTCA/DES x 1 proximal LAD (Onyx stent platform placed over older Synergy stent) Chronic occlusion mid Circumflex beyond the stented segment. Patent obtuse marginal branch with severe distal stenosis (too small for PCI) Dominant RCA with moderate proximal stenosis, patent distal stent. LVEDP=19 mmHg Recommendations: Continue DAPT with ASA and Brilinta.    Assessment/Plan  AKI on CKD 3b: - in the setting of NSTEMI and cath - still making urine - will hold torsemide today - hopefully Cr will start to plateau - she is willing to start dialysis if needed--> hopefully will not need but she has had multiple insults over a short period of time  2.  NSTEMI:  - s/p severe restenosis of prox LAD lesion  3.  DM I  - per primary  4.  S/p mitraclip 2022  5.  Dispo: pending  ,  02/15/2023, 2:10 PM

## 2023-02-15 NOTE — Plan of Care (Signed)
  Problem: Education: Goal: Understanding of CV disease, CV risk reduction, and recovery process will improve Outcome: Progressing Goal: Individualized Educational Video(s) Outcome: Progressing   Problem: Activity: Goal: Ability to return to baseline activity level will improve Outcome: Progressing   Problem: Cardiovascular: Goal: Ability to achieve and maintain adequate cardiovascular perfusion will improve Outcome: Progressing Goal: Vascular access site(s) Level 0-1 will be maintained Outcome: Progressing   Problem: Health Behavior/Discharge Planning: Goal: Ability to safely manage health-related needs after discharge will improve Outcome: Progressing   Problem: Education: Goal: Ability to describe self-care measures that may prevent or decrease complications (Diabetes Survival Skills Education) will improve Outcome: Progressing Goal: Individualized Educational Video(s) Outcome: Progressing   Problem: Coping: Goal: Ability to adjust to condition or change in health will improve Outcome: Progressing   Problem: Fluid Volume: Goal: Ability to maintain a balanced intake and output will improve Outcome: Progressing   Problem: Health Behavior/Discharge Planning: Goal: Ability to identify and utilize available resources and services will improve Outcome: Progressing Goal: Ability to manage health-related needs will improve Outcome: Progressing   Problem: Metabolic: Goal: Ability to maintain appropriate glucose levels will improve Outcome: Progressing   Problem: Nutritional: Goal: Maintenance of adequate nutrition will improve Outcome: Progressing Goal: Progress toward achieving an optimal weight will improve Outcome: Progressing   Problem: Skin Integrity: Goal: Risk for impaired skin integrity will decrease Outcome: Progressing   Problem: Tissue Perfusion: Goal: Adequacy of tissue perfusion will improve Outcome: Progressing   Problem: Education: Goal:  Understanding of cardiac disease, CV risk reduction, and recovery process will improve Outcome: Progressing Goal: Individualized Educational Video(s) Outcome: Progressing   Problem: Activity: Goal: Ability to tolerate increased activity will improve Outcome: Progressing   Problem: Cardiac: Goal: Ability to achieve and maintain adequate cardiovascular perfusion will improve Outcome: Progressing   Problem: Health Behavior/Discharge Planning: Goal: Ability to safely manage health-related needs after discharge will improve Outcome: Progressing   Problem: Education: Goal: Knowledge of General Education information will improve Description: Including pain rating scale, medication(s)/side effects and non-pharmacologic comfort measures Outcome: Progressing   Problem: Health Behavior/Discharge Planning: Goal: Ability to manage health-related needs will improve Outcome: Progressing   Problem: Clinical Measurements: Goal: Ability to maintain clinical measurements within normal limits will improve Outcome: Progressing Goal: Will remain free from infection Outcome: Progressing Goal: Diagnostic test results will improve Outcome: Progressing Goal: Respiratory complications will improve Outcome: Progressing Goal: Cardiovascular complication will be avoided Outcome: Progressing   Problem: Activity: Goal: Risk for activity intolerance will decrease Outcome: Progressing   Problem: Nutrition: Goal: Adequate nutrition will be maintained Outcome: Progressing   Problem: Coping: Goal: Level of anxiety will decrease Outcome: Progressing   Problem: Elimination: Goal: Will not experience complications related to bowel motility Outcome: Progressing   Problem: Pain Managment: Goal: General experience of comfort will improve Outcome: Progressing   Problem: Safety: Goal: Ability to remain free from injury will improve Outcome: Progressing   Problem: Skin Integrity: Goal: Risk for  impaired skin integrity will decrease Outcome: Progressing

## 2023-02-16 DIAGNOSIS — M1A9XX0 Chronic gout, unspecified, without tophus (tophi): Secondary | ICD-10-CM | POA: Diagnosis not present

## 2023-02-16 DIAGNOSIS — N1832 Chronic kidney disease, stage 3b: Secondary | ICD-10-CM | POA: Diagnosis not present

## 2023-02-16 DIAGNOSIS — I5032 Chronic diastolic (congestive) heart failure: Secondary | ICD-10-CM | POA: Diagnosis not present

## 2023-02-16 DIAGNOSIS — I214 Non-ST elevation (NSTEMI) myocardial infarction: Secondary | ICD-10-CM | POA: Diagnosis not present

## 2023-02-16 LAB — GLUCOSE, CAPILLARY
Glucose-Capillary: 89 mg/dL (ref 70–99)
Glucose-Capillary: 150 mg/dL — ABNORMAL HIGH (ref 70–99)
Glucose-Capillary: 159 mg/dL — ABNORMAL HIGH (ref 70–99)
Glucose-Capillary: 229 mg/dL — ABNORMAL HIGH (ref 70–99)
Glucose-Capillary: 269 mg/dL — ABNORMAL HIGH (ref 70–99)
Glucose-Capillary: 239 mg/dL — ABNORMAL HIGH (ref 70–99)

## 2023-02-16 MED ORDER — FUROSEMIDE 10 MG/ML IJ SOLN
80.0000 mg | Freq: Once | INTRAMUSCULAR | Status: AC
  Administered 2023-02-16: 80 mg via INTRAVENOUS
  Filled 2023-02-16: qty 8

## 2023-02-16 MED ORDER — PROCHLORPERAZINE EDISYLATE 10 MG/2ML IJ SOLN
5.0000 mg | Freq: Four times a day (QID) | INTRAMUSCULAR | Status: DC | PRN
  Administered 2023-02-16 – 2023-02-19 (×5): 5 mg via INTRAVENOUS
  Filled 2023-02-16 (×7): qty 1

## 2023-02-16 MED ORDER — PROCHLORPERAZINE EDISYLATE 10 MG/2ML IJ SOLN
5.0000 mg | Freq: Once | INTRAMUSCULAR | Status: AC
  Administered 2023-02-16: 5 mg via INTRAVENOUS
  Filled 2023-02-16: qty 1

## 2023-02-16 NOTE — Progress Notes (Signed)
PROGRESS NOTE    Dana [name]  BJY:[id number] DOB: June 23, 1961 DOA: 02/12/2023 PCP: Blane Ohara, MD    Chief Complaint  Patient presents with   Chest Pain    Brief Narrative:  Patient is a 62 year old female history of anxiety, chronic low back pain, CKD 3B, OSA, chronic hypoxic respiratory failure, IDDM, complex cardiac history of multiple PCI procedures and complications including contrast-induced nephropathy presented to the ED with chest pain. -Patient noted to have substernal chest pressure with ambulation ongoing despite recent increase in her isosorbide.  Patient took 3 nitroglycerin prior to presentation with ongoing symptoms.  Patient seen in the ED noted to have sats in the mid 90s on 4 L nasal cannula, EKG showed normal sinus rhythm right axis deviation, chest x-ray with cardiomegaly and chronic interstitial thickening.  Troponin noted to be elevated at 107 increased to 941. -Patient seen in consultation by cardiology, patient placed on a heparin drip and nitroglycerin drips, and cardiology recommended cardiac catheterization 02/13/2023 for further evaluation.   Assessment & Plan:   Principal Problem:   NSTEMI (non-ST elevated myocardial infarction) (HCC) Active Problems:   Acute renal failure superimposed on stage 3b chronic kidney disease (HCC)   Chronic pain syndrome   Chronic heart failure with preserved ejection fraction (HFpEF) (HCC)   Chronic respiratory failure with hypoxia (HCC)   Chronic gout without tophus   Uncomplicated opioid dependence (HCC)   OSA on CPAP   Insulin dependent type 2 diabetes mellitus (HCC)   CKD stage 3b, GFR 30-44 ml/min (HCC)   GAD (generalized anxiety disorder)  #1 Non-STEMI -Patient presented with chest pain on exertion with no improvement despite recent increase in isosrbide, as well as nitroglycerin. -Patient with some symptomatic improvement on IV heparin and IV nitroglycerin drip. -Due to patient's complex cardiac history  cardiology consulted who assessed the patient and recommended cardiac catheterization, 02/13/2023. -Cardiac catheterization done showed severe restenosis in the proximal LAD stent status post successful IVUS guided PTCA/DES x 1 proximal LAD.  Chronic occlusion mid circumflex beyond stented segment.  Patent obtuse marginal branch with severe distal stenosis which was too small for PCI.  Dominant RCA with moderate proximal stenosis, patent distal stent noted. -Clinical improvement postcardiac catheterization. -Cardiology recommending dual antiplatelet therapy with aspirin and Brilinta.  Continue Coreg. -IV nitroglycerin drip, IV heparin discontinued per cardiology. -Patient started back on home regimen torsemide on 02/14/2023 however due to worsening renal function torsemide has been discontinued.   -Cardiology was following but have signed off as of 02/15/2023.   -Will need close outpatient follow-up with cardiology on discharge.   2.  Dyspnea -, Likely secondary to IV fluids patient was placed on with gentle hydration in addition to holding her diuretics. -Dyspnea improved after Lasix 40 mg IV x 1 given on 02/13/2023.   -Patient status post cardiac catheterization, 02/13/2023.  -Patient denies any significant dyspnea currently.  3.  Insulin-dependent diabetes mellitus 1 -Hemoglobin A1c 8.5 (02/13/2023). -CBG 89 this morning. -Patient placed back on a diet.  -Patient noted to be on sliding scale NovoLog, Levemir 55 units daily prior to admission. -Continue Semglee 34 units daily, SSI.    4.  Acute kidney injury on CKD stage IIIb -Patient noted to have worsening renal function today with creatinine currently at 4.81 from 3.35 from 2.09 from 1.88 from 1.95 on admission.  -Baseline creatinine approximately 1.7-2. -Patient noted to have multiple contrast-induced nephropathy post cath in the past. -Patient was placed on gentle hydration and torsemide held in anticipation  of cardiac catheterization on  presentation. -Patient with dyspnea on exertion the morning of 02/13/2023, IV fluids discontinued and patient given a dose of IV Lasix with good urine output.   -Post cath creatinine at 4.81 today from 3.35 from 2.09 from 1.88.  -IV Lasix discontinued and patient was resumed on home regimen torsemide on 02/14/2023.   -Due to worsening renal function torsemide was discontinued on 02/15/2023.   -Urinalysis done with small leukocytes, nitrite negative, protein negative, 6-10 WBCs.  Urine sodium of 27, urine creatinine of 97.  -Renal ultrasound done was negative for hydronephrosis.  We will discontinue torsemide for now.   -Nephrology consulted, patient seen in consultation by Dr. Signe Colt and due to worsening renal function Lasix challenge ordered today per nephrology and if renal function continues to worsen then on 02/18/2023 may need to start patient on dialysis if needed. -Nephrology following and appreciate their input and recommendations.  5.  General anxiety disorder -BuSpar, Ativan.   6.  Chronic back pain -Continue home regimen Norco.   7.  OSA/chronic hypoxic respiratory failure -CPAP nightly, supplemental O2.  8.  Chronic HFpEF -Compensated on presentation. -Torsemide held and patient started on gentle hydration in anticipation of cardiac catheterization on 02/13/2023, however due to dyspnea on exertion, concern for volume overload IV fluids were discontinued and patient received a dose of Lasix 40 mg IV x 1 with a urine output of 1.550 L.   -Home regimen torsemide resumed 02/14/2023 per cardiology recommendations however due to worsening renal function, torsemide was discontinued.  -Per cardiology.    9.  Moderate MR status post transcatheter edge-to-edge repair of mitral valve -Stable. -Per cardiology.  10.  Gout -Allopurinol   DVT prophylaxis: Heparin drip Code Status: Full Family Communication: Updated patient, no family at bedside.  Disposition: Home once renal function improves  and patient cleared by nephrology.  Status is: Inpatient Remains inpatient appropriate because: Severity of illness   Consultants:  Cardiology: Dr. Excell Seltzer 02/12/2023 Nephrology: Dr. Signe Colt 02/15/2023  Procedures:  Chest x-ray 02/12/2023, 02/15/2023 Cardiac catheterization 02/13/2023-per Dr. Clifton James Renal ultrasound 02/15/2023   Antimicrobials:  Anti-infectives (From admission, onward)    Start     Dose/Rate Route Frequency Ordered Stop   02/12/23 2200  cephALEXin (KEFLEX) capsule 250 mg        250 mg Oral Daily at bedtime 02/12/23 1941           Subjective: Laying in bed.  On the phone with her daughter.  Denies any chest pain.  States has some occasional flutters that are very short lasting.  Denies any significant shortness of breath.  Complains of nausea not relieved by Zofran.  States urine output is decreasing.      Objective: Vitals:   02/15/23 2342 02/16/23 0307 02/16/23 0400 02/16/23 0815  BP: (!) 133/42 (!) 117/48  (!) 130/53  Pulse: 70 71  73  Resp: 19 15  15   Temp: 98.1 F (36.7 C) 98.2 F (36.8 C)  98 F (36.7 C)  TempSrc: Oral Oral  Oral  SpO2: 100% 96%  97%  Weight:   103.7 kg   Height:        Intake/Output Summary (Last 24 hours) at 02/16/2023 1138 Last data filed at 02/16/2023 0400 Gross per 24 hour  Intake 240 ml  Output 275 ml  Net -35 ml   Filed Weights   02/14/23 0421 02/15/23 0500 02/16/23 0400  Weight: 103.3 kg 103.3 kg 103.7 kg    Examination:  General exam: NAD.  Respiratory system: Diffuse bilateral crackles.  No wheezing.  Fair air movement.  Speaking in full sentences.   Cardiovascular system: RRR no murmurs rubs or gallops.  Trace bilateral lower extremity edema.  Gastrointestinal system: Abdomen is soft, nontender, nondistended, positive bowel sounds.  No rebound.  No guarding.  Central nervous system: Alert and oriented.  Moving extremities spontaneously.  No focal neurological deficits. Extremities: Trace bilateral lower extremity  edema.  Symmetric 5 x 5 power. Skin: No rashes, lesions or ulcers Psychiatry: Judgement and insight appear normal. Mood & affect appropriate.     Data Reviewed: I have personally reviewed following labs and imaging studies  CBC: Recent Labs  Lab 02/12/23 1501 02/13/23 0202 02/14/23 0247 02/15/23 0037 02/16/23 0018  WBC 15.5* 13.1* 13.0* 12.8* 11.9*  HGB 10.4* 9.5* 9.4* 9.1* 8.5*  HCT 33.0* 30.0* 29.4* 29.1* 27.5*  MCV 86.4 85.7 86.0 86.4 87.3  PLT 243 228 241 206 207    Basic Metabolic Panel: Recent Labs  Lab 02/12/23 1501 02/13/23 0202 02/14/23 0247 02/15/23 0037 02/16/23 0018  NA 135 136 135 134* 136  K 3.8 3.7 3.3* 4.2 4.2  CL 100 100 100 100 101  CO2 21* 24 22 22 24   GLUCOSE 456* 338* 235* 219* 108*  BUN 58* 56* 53* 60* 71*  CREATININE 1.95* 1.88* 2.09* 3.35* 4.81*  CALCIUM 8.8* 8.7* 8.3* 8.7* 8.4*  MG  --  1.8  --   --   --   PHOS  --   --   --   --  6.0*    GFR: Estimated Creatinine Clearance: 14.6 mL/min (A) (by C-G formula based on SCr of 4.81 mg/dL (H)).  Liver Function Tests: Recent Labs  Lab 02/16/23 0018  ALBUMIN 2.7*    CBG: Recent Labs  Lab 02/15/23 1930 02/15/23 2341 02/16/23 0346 02/16/23 0812 02/16/23 1134  GLUCAP 235* 108* 89 150* 159*     Recent Results (from the past 240 hour(s))  MRSA Next Gen by PCR, Nasal     Status: None   Collection Time: 02/12/23 11:37 PM   Specimen: Nasal Mucosa; Nasal Swab  Result Value Ref Range Status   MRSA by PCR Next Gen NOT DETECTED NOT DETECTED Final    Comment: (NOTE) The GeneXpert MRSA Assay (FDA approved for NASAL specimens only), is one component of a comprehensive MRSA colonization surveillance program. It is not intended to diagnose MRSA infection nor to guide or monitor treatment for MRSA infections. Test performance is not FDA approved in patients less than 37 years old. Performed at [facility], [address], [place], [place] [address]          Radiology  Studies: DG Chest 2 View  Result Date: 02/15/2023 CLINICAL DATA:  Shortness of breath EXAM: CHEST - 2 VIEW COMPARISON:  02/12/2023 and older FINDINGS: Enlarged cardiopericardial silhouette. Mitral valve clips and pulmonary arterial device in place. Vascular congestion. Question trace edema. No pneumothorax or effusion. Overlapping cardiac leads. Possible coronary stent. IMPRESSION: Enlarged heart with vascular congestion question trace edema. Electronically Signed   By: Karen Kays M.D.   On: 02/15/2023 12:18   US RENAL  Result Date: 02/15/2023 CLINICAL DATA:  Acute renal failure EXAM: RENAL / URINARY TRACT ULTRASOUND COMPLETE COMPARISON:  None Available. FINDINGS: Right Kidney: Renal measurements: 10.4 x 4.4 x 8.7 cm = volume: 112.6 mL. Echogenicity within normal limits. No mass or hydronephrosis visualized. There is an echogenic shadowing areas centrally measuring 6 mm. A small stone is possible.  Left Kidney: Renal measurements: 11.9 x 4.8 x 5.1 cm = volume: 151.6 mL. Echogenicity within normal limits. No mass or hydronephrosis visualized. Bladder: Appears normal for degree of bladder distention. Other: None. IMPRESSION: No collecting system dilatation. Question small right-sided renal stone. Electronically Signed   By: Karen Kays M.D.   On: 02/15/2023 12:17        Scheduled Meds:  allopurinol  100 mg Oral Daily   aspirin EC  81 mg Oral Daily   busPIRone  10 mg Oral TID   carvedilol  12.5 mg Oral BID WC   cephALEXin  250 mg Oral QHS   insulin aspart  0-6 Units Subcutaneous Q4H   insulin glargine-yfgn  34 Units Subcutaneous Daily   latanoprost  1 drop Both Eyes QHS   linaclotide  145 mcg Oral QAC breakfast   pantoprazole  40 mg Oral Daily   polyethylene glycol  17 g Oral Daily   ticagrelor  90 mg Oral BID   Continuous Infusions:  sodium chloride     nitroGLYCERIN Stopped (02/13/23 2307)     LOS: 4 days    Time spent: 40 minutes    Ramiro Harvest, MD [facility]   To contact the attending provider between 7A-7P or the covering provider during after hours 7P-7A, please log into the web site [email or web address] and access using universal [place] password for that web site. If you do not have the password, please call the hospital operator.  02/16/2023, 11:38 AM

## 2023-02-16 NOTE — Progress Notes (Signed)
Patient urinated clear yellow urine into hat.

## 2023-02-16 NOTE — Progress Notes (Signed)
[facility] Progress Note   Assessment/ Plan:    AKI on CKD 3b: - in the setting of NSTEMI and cath - still making urine-- but has dropped off overnight - Cr still uptrending - lasix challenge today - discussed with pt and dtr- willing to do dialysis if needed   2.  NSTEMI:             - s/p severe restenosis of prox LAD lesion  - on Brilinta   3.  DM I             - per primary   4.  S/p mitraclip 2022   5.  Dispo: pending  Subjective:    Cr continues to climb. Lower UOP.  Says she's "OK", having a little nausea.  Discussed with pt and dtr over the phone.  Lasix challenge today, observe output.  If no improvement, TDC and HD by Monday.    Objective:   BP (!) 130/53 (BP Location: Left Arm)   Pulse 73   Temp 98 F (36.7 C) (Oral)   Resp 15   Ht 5\' 4"  (1.626 m)   Wt 103.7 kg   SpO2 97%   BMI 39.24 kg/m   Intake/Output Summary (Last 24 hours) at 02/16/2023 1006 Last data filed at 02/16/2023 0400 Gross per 24 hour  Intake 240 ml  Output 275 ml  Net -35 ml   Weight change: 0.4 kg  Physical Exam: GEN NAD, sitting in bed HEENT EOMI PERRL NECK no JVD PULM normal WOB, wearing O2, some basilar crackles on R, not so much on L CV RRR ABD: soft EXT trace LE edema NEURO  AAO x 3   Imaging: DG Chest 2 View  Result Date: 02/15/2023 CLINICAL DATA:  Shortness of breath EXAM: CHEST - 2 VIEW COMPARISON:  02/12/2023 and older FINDINGS: Enlarged cardiopericardial silhouette. Mitral valve clips and pulmonary arterial device in place. Vascular congestion. Question trace edema. No pneumothorax or effusion. Overlapping cardiac leads. Possible coronary stent. IMPRESSION: Enlarged heart with vascular congestion question trace edema. Electronically Signed   By: Karen Kays M.D.   On: 02/15/2023 12:18   US RENAL  Result Date: 02/15/2023 CLINICAL DATA:  Acute renal failure EXAM: RENAL / URINARY TRACT ULTRASOUND COMPLETE COMPARISON:  None Available. FINDINGS: Right Kidney:  Renal measurements: 10.4 x 4.4 x 8.7 cm = volume: 112.6 mL. Echogenicity within normal limits. No mass or hydronephrosis visualized. There is an echogenic shadowing areas centrally measuring 6 mm. A small stone is possible. Left Kidney: Renal measurements: 11.9 x 4.8 x 5.1 cm = volume: 151.6 mL. Echogenicity within normal limits. No mass or hydronephrosis visualized. Bladder: Appears normal for degree of bladder distention. Other: None. IMPRESSION: No collecting system dilatation. Question small right-sided renal stone. Electronically Signed   By: Karen Kays M.D.   On: 02/15/2023 12:17    Labs: BMET Recent Labs  Lab 02/12/23 1501 02/13/23 0202 02/14/23 0247 02/15/23 0037 02/16/23 0018  NA 135 136 135 134* 136  K 3.8 3.7 3.3* 4.2 4.2  CL 100 100 100 100 101  CO2 21* 24 22 22 24   GLUCOSE 456* 338* 235* 219* 108*  BUN 58* 56* 53* 60* 71*  CREATININE 1.95* 1.88* 2.09* 3.35* 4.81*  CALCIUM 8.8* 8.7* 8.3* 8.7* 8.4*  PHOS  --   --   --   --  6.0*   CBC Recent Labs  Lab 02/13/23 0202 02/14/23 0247 02/15/23 0037 02/16/23 0018  WBC 13.1* 13.0* 12.8* 11.9*  HGB 9.5* 9.4* 9.1* 8.5*  HCT 30.0* 29.4* 29.1* 27.5*  MCV 85.7 86.0 86.4 87.3  PLT 228 241 206 207    Medications:     allopurinol  100 mg Oral Daily   aspirin EC  81 mg Oral Daily   busPIRone  10 mg Oral TID   carvedilol  12.5 mg Oral BID WC   cephALEXin  250 mg Oral QHS   furosemide  80 mg Intravenous Once   insulin aspart  0-6 Units Subcutaneous Q4H   insulin glargine-yfgn  34 Units Subcutaneous Daily   latanoprost  1 drop Both Eyes QHS   linaclotide  145 mcg Oral QAC breakfast   pantoprazole  40 mg Oral Daily   polyethylene glycol  17 g Oral Daily   ticagrelor  90 mg Oral BID    Bufford Buttner, MD 02/16/2023, 10:06 AM

## 2023-02-16 NOTE — Plan of Care (Signed)
  Problem: Education: Goal: Understanding of CV disease, CV risk reduction, and recovery process will improve Outcome: Progressing Goal: Individualized Educational Video(s) Outcome: Progressing   Problem: Activity: Goal: Ability to return to baseline activity level will improve Outcome: Progressing   Problem: Cardiovascular: Goal: Ability to achieve and maintain adequate cardiovascular perfusion will improve Outcome: Progressing Goal: Vascular access site(s) Level 0-1 will be maintained Outcome: Progressing   Problem: Health Behavior/Discharge Planning: Goal: Ability to safely manage health-related needs after discharge will improve Outcome: Progressing   Problem: Education: Goal: Ability to describe self-care measures that may prevent or decrease complications (Diabetes Survival Skills Education) will improve Outcome: Progressing Goal: Individualized Educational Video(s) Outcome: Progressing   Problem: Coping: Goal: Ability to adjust to condition or change in health will improve Outcome: Progressing   Problem: Fluid Volume: Goal: Ability to maintain a balanced intake and output will improve Outcome: Progressing   Problem: Health Behavior/Discharge Planning: Goal: Ability to identify and utilize available resources and services will improve Outcome: Progressing Goal: Ability to manage health-related needs will improve Outcome: Progressing   Problem: Metabolic: Goal: Ability to maintain appropriate glucose levels will improve Outcome: Progressing   Problem: Nutritional: Goal: Maintenance of adequate nutrition will improve Outcome: Progressing Goal: Progress toward achieving an optimal weight will improve Outcome: Progressing   Problem: Skin Integrity: Goal: Risk for impaired skin integrity will decrease Outcome: Progressing   Problem: Tissue Perfusion: Goal: Adequacy of tissue perfusion will improve Outcome: Progressing   Problem: Education: Goal:  Understanding of cardiac disease, CV risk reduction, and recovery process will improve Outcome: Progressing Goal: Individualized Educational Video(s) Outcome: Progressing   Problem: Activity: Goal: Ability to tolerate increased activity will improve Outcome: Progressing   Problem: Cardiac: Goal: Ability to achieve and maintain adequate cardiovascular perfusion will improve Outcome: Progressing   Problem: Health Behavior/Discharge Planning: Goal: Ability to safely manage health-related needs after discharge will improve Outcome: Progressing   Problem: Education: Goal: Knowledge of General Education information will improve Description: Including pain rating scale, medication(s)/side effects and non-pharmacologic comfort measures Outcome: Progressing   Problem: Health Behavior/Discharge Planning: Goal: Ability to manage health-related needs will improve Outcome: Progressing   Problem: Clinical Measurements: Goal: Ability to maintain clinical measurements within normal limits will improve Outcome: Progressing Goal: Will remain free from infection Outcome: Progressing Goal: Diagnostic test results will improve Outcome: Progressing Goal: Respiratory complications will improve Outcome: Progressing Goal: Cardiovascular complication will be avoided Outcome: Progressing   Problem: Activity: Goal: Risk for activity intolerance will decrease Outcome: Progressing   Problem: Nutrition: Goal: Adequate nutrition will be maintained Outcome: Progressing   Problem: Coping: Goal: Level of anxiety will decrease Outcome: Progressing   Problem: Elimination: Goal: Will not experience complications related to bowel motility Outcome: Progressing Goal: Will not experience complications related to urinary retention Outcome: Progressing   Problem: Pain Managment: Goal: General experience of comfort will improve Outcome: Progressing   Problem: Safety: Goal: Ability to remain free from  injury will improve Outcome: Progressing   Problem: Skin Integrity: Goal: Risk for impaired skin integrity will decrease Outcome: Progressing

## 2023-02-16 NOTE — Progress Notes (Signed)
CARDIAC REHAB PHASE I   [phone] Attempted ambulation with patient. She states she's very nauseated at this time and declined to walk. Will walk later if feeling better. Follow-up as able or walk with nursing staff.  Artist Pais, MS, ACSM CEP 02/16/23

## 2023-02-17 ENCOUNTER — Encounter (HOSPITAL_COMMUNITY): Payer: Self-pay | Admitting: Family Medicine

## 2023-02-17 DIAGNOSIS — N1832 Chronic kidney disease, stage 3b: Secondary | ICD-10-CM | POA: Diagnosis not present

## 2023-02-17 DIAGNOSIS — I214 Non-ST elevation (NSTEMI) myocardial infarction: Secondary | ICD-10-CM | POA: Diagnosis not present

## 2023-02-17 DIAGNOSIS — M1A9XX0 Chronic gout, unspecified, without tophus (tophi): Secondary | ICD-10-CM | POA: Diagnosis not present

## 2023-02-17 DIAGNOSIS — I5032 Chronic diastolic (congestive) heart failure: Secondary | ICD-10-CM | POA: Diagnosis not present

## 2023-02-17 LAB — GLUCOSE, CAPILLARY
Glucose-Capillary: 107 mg/dL — ABNORMAL HIGH (ref 70–99)
Glucose-Capillary: 131 mg/dL — ABNORMAL HIGH (ref 70–99)
Glucose-Capillary: 133 mg/dL — ABNORMAL HIGH (ref 70–99)
Glucose-Capillary: 158 mg/dL — ABNORMAL HIGH (ref 70–99)
Glucose-Capillary: 245 mg/dL — ABNORMAL HIGH (ref 70–99)
Glucose-Capillary: 251 mg/dL — ABNORMAL HIGH (ref 70–99)

## 2023-02-17 LAB — CBC
HCT: 25.6 % — ABNORMAL LOW (ref 36.0–46.0)
Hemoglobin: 8.1 g/dL — ABNORMAL LOW (ref 12.0–15.0)
MCH: 27.6 pg (ref 26.0–34.0)
MCHC: 31.6 g/dL (ref 30.0–36.0)
MCV: 87.4 fL (ref 80.0–100.0)
Platelets: 210 10*3/uL (ref 150–400)
RBC: 2.93 MIL/uL — ABNORMAL LOW (ref 3.87–5.11)
RDW: 16.2 % — ABNORMAL HIGH (ref 11.5–15.5)
WBC: 11.9 10*3/uL — ABNORMAL HIGH (ref 4.0–10.5)
nRBC: 0 % (ref 0.0–0.2)

## 2023-02-17 LAB — RENAL FUNCTION PANEL
Albumin: 2.7 g/dL — ABNORMAL LOW (ref 3.5–5.0)
Anion gap: 10 (ref 5–15)
BUN: 76 mg/dL — ABNORMAL HIGH (ref 6–20)
CO2: 23 mmol/L (ref 22–32)
Calcium: 7.9 mg/dL — ABNORMAL LOW (ref 8.9–10.3)
Chloride: 102 mmol/L (ref 98–111)
Creatinine, Ser: 5.19 mg/dL — ABNORMAL HIGH (ref 0.44–1.00)
GFR, Estimated: 9 mL/min — ABNORMAL LOW (ref >60)
Glucose, Bld: 203 mg/dL — ABNORMAL HIGH (ref 70–99)
Phosphorus: 5.8 mg/dL — ABNORMAL HIGH (ref 2.5–4.6)
Potassium: 4.3 mmol/L (ref 3.5–5.1)
Sodium: 135 mmol/L (ref 135–145)

## 2023-02-17 LAB — HEPATITIS B SURFACE ANTIGEN: Hepatitis B Surface Ag: NONREACTIVE

## 2023-02-17 LAB — MAGNESIUM: Magnesium: 2 mg/dL (ref 1.7–2.4)

## 2023-02-17 MED ORDER — LORAZEPAM 0.5 MG PO TABS
0.5000 mg | ORAL_TABLET | Freq: Two times a day (BID) | ORAL | Status: DC | PRN
  Administered 2023-02-17 – 2023-02-20 (×5): 0.5 mg via ORAL
  Filled 2023-02-17 (×5): qty 1

## 2023-02-17 MED ORDER — CHLORHEXIDINE GLUCONATE CLOTH 2 % EX PADS
6.0000 | MEDICATED_PAD | Freq: Every day | CUTANEOUS | Status: DC
  Administered 2023-02-18: 6 via TOPICAL

## 2023-02-17 NOTE — Progress Notes (Signed)
[facility] Progress Note   Assessment/ Plan:    AKI on CKD 3b: - in the setting of NSTEMI and cath - still making urine-- but has dropped off overnight - Cr still uptrending - lasix challenge 02/16/23- had good UOP but Cr up - discussed with pt and dtr- willing to do dialysis if needed--> she would like to pursue so she does not have issues with fluid and can get all her PCIs - she feels like with CKD and CAD she doesn't have a good QOL now and she is hoping that the RRT will help achieve a better QOL  - TDC ordered with IR< appreciate, HD #1 orders written for tomorrow   2.  NSTEMI:             - s/p severe restenosis of prox LAD lesion  - on Brilinta   3.  DM I             - per primary   4.  S/p mitraclip 2022   5.  Dispo: pending  Subjective:    Cr continues to climb, discussion today- wants to pursue HD.     Objective:   BP (!) 160/59 (BP Location: Left Arm)   Pulse 73   Temp 98.2 F (36.8 C) (Oral)   Resp 11   Ht 5\' 4"  (1.626 m)   Wt 103.4 kg   SpO2 94%   BMI 39.14 kg/m   Intake/Output Summary (Last 24 hours) at 02/17/2023 1141 Last data filed at 02/17/2023 0736 Gross per 24 hour  Intake 892 ml  Output 1300 ml  Net -408 ml   Weight change: -0.28 kg  Physical Exam: GEN NAD, sitting in bed HEENT EOMI PERRL NECK no JVD PULM normal WOB, wearing O2, some basilar crackles on R, not so much on L CV RRR ABD: soft EXT trace LE edema NEURO  AAO x 3   Imaging: No results found.  Labs: BMET Recent Labs  Lab 02/12/23 1501 02/13/23 0202 02/14/23 0247 02/15/23 0037 02/16/23 0018 02/17/23 0043  NA 135 136 135 134* 136 135  K 3.8 3.7 3.3* 4.2 4.2 4.3  CL 100 100 100 100 101 102  CO2 21* 24 22 22 24 23   GLUCOSE 456* 338* 235* 219* 108* 203*  BUN 58* 56* 53* 60* 71* 76*  CREATININE 1.95* 1.88* 2.09* 3.35* 4.81* 5.19*  CALCIUM 8.8* 8.7* 8.3* 8.7* 8.4* 7.9*  PHOS  --   --   --   --  6.0* 5.8*   CBC Recent Labs  Lab 02/14/23 0247  02/15/23 0037 02/16/23 0018 02/17/23 0043  WBC 13.0* 12.8* 11.9* 11.9*  HGB 9.4* 9.1* 8.5* 8.1*  HCT 29.4* 29.1* 27.5* 25.6*  MCV 86.0 86.4 87.3 87.4  PLT 241 206 207 210    Medications:     allopurinol  100 mg Oral Daily   aspirin EC  81 mg Oral Daily   busPIRone  10 mg Oral TID   carvedilol  12.5 mg Oral BID WC   cephALEXin  250 mg Oral QHS   [START ON 02/18/2023] Chlorhexidine Gluconate Cloth  6 each Topical Q0600   insulin aspart  0-6 Units Subcutaneous Q4H   insulin glargine-yfgn  34 Units Subcutaneous Daily   latanoprost  1 drop Both Eyes QHS   linaclotide  145 mcg Oral QAC breakfast   pantoprazole  40 mg Oral Daily   polyethylene glycol  17 g Oral Daily   ticagrelor  90 mg Oral  BID    Bufford Buttner, MD 02/17/2023, 11:41 AM

## 2023-02-17 NOTE — Progress Notes (Signed)
PROGRESS NOTE    Dana [name]  ZOX:[id number] DOB: [birth year] DOA: 02/12/2023 PCP: Blane Ohara, MD    Chief Complaint  Patient presents with   Chest Pain    Brief Narrative:  Patient is a 62 year old female history of anxiety, chronic low back pain, CKD 3B, OSA, chronic hypoxic respiratory failure, IDDM, complex cardiac history of multiple PCI procedures and complications including contrast-induced nephropathy presented to the ED with chest pain. -Patient noted to have substernal chest pressure with ambulation ongoing despite recent increase in her isosorbide.  Patient took 3 nitroglycerin prior to presentation with ongoing symptoms.  Patient seen in the ED noted to have sats in the mid 90s on 4 L nasal cannula, EKG showed normal sinus rhythm right axis deviation, chest x-ray with cardiomegaly and chronic interstitial thickening.  Troponin noted to be elevated at 107 increased to 941. -Patient seen in consultation by cardiology, patient placed on a heparin drip and nitroglycerin drips, and cardiology recommended cardiac catheterization 02/13/2023 for further evaluation.   Assessment & Plan:   Principal Problem:   NSTEMI (non-ST elevated myocardial infarction) (HCC) Active Problems:   Acute renal failure superimposed on stage 3b chronic kidney disease (HCC)   Chronic pain syndrome   Chronic heart failure with preserved ejection fraction (HFpEF) (HCC)   Chronic respiratory failure with hypoxia (HCC)   Chronic gout without tophus   Uncomplicated opioid dependence (HCC)   OSA on CPAP   Insulin dependent type 2 diabetes mellitus (HCC)   CKD stage 3b, GFR 30-44 ml/min (HCC)   GAD (generalized anxiety disorder)  #1 Non-STEMI -Patient presented with chest pain on exertion with no improvement despite recent increase in isosrbide, as well as nitroglycerin. -Patient with some symptomatic improvement on IV heparin and IV nitroglycerin drip. -Due to patient's complex cardiac history  cardiology consulted who assessed the patient and recommended cardiac catheterization, 02/13/2023. -Cardiac catheterization done showed severe restenosis in the proximal LAD stent status post successful IVUS guided PTCA/DES x 1 proximal LAD.  Chronic occlusion mid circumflex beyond stented segment.  Patent obtuse marginal branch with severe distal stenosis which was too small for PCI.  Dominant RCA with moderate proximal stenosis, patent distal stent noted. -Clinical improvement postcardiac catheterization. -Cardiology recommending dual antiplatelet therapy with aspirin and Brilinta.  Continue Coreg. -IV nitroglycerin drip, IV heparin discontinued per cardiology. -Patient started back on home regimen torsemide on 02/14/2023 however due to worsening renal function torsemide has been discontinued.   -Cardiology was following but have signed off as of 02/15/2023.   -Will need close outpatient follow-up with cardiology on discharge.   2.  Dyspnea -, Likely secondary to IV fluids patient was placed on with gentle hydration in addition to holding her diuretics. -Dyspnea improved after Lasix 40 mg IV x 1 given on 02/13/2023.   -Patient status post cardiac catheterization, 02/13/2023.  -Patient denies any significant dyspnea currently.  3.  Insulin-dependent diabetes mellitus 1 -Hemoglobin A1c 8.5 (02/13/2023). -CBG 133 this morning. -Patient noted to be on sliding scale NovoLog, Levemir 55 units daily prior to admission. -Continue Semglee 34 units daily, SSI.    4.  Acute kidney injury on CKD stage IIIb -Patient noted to have worsening renal function today with creatinine currently at 5.19 from 4.81 from 3.35 from 2.09 from 1.88 from 1.95 on admission.  -Baseline creatinine approximately 1.7-2. -Patient noted to have multiple contrast-induced nephropathy post cath in the past. -Patient was placed on gentle hydration and torsemide held in anticipation of cardiac catheterization on presentation. -  Patient  with dyspnea on exertion the morning of 02/13/2023, IV fluids discontinued and patient given a dose of IV Lasix with good urine output.   -Post cath creatinine at 5.19 from 4.81 from 3.35 from 2.09 from 1.88.  -IV Lasix discontinued and patient was resumed on home regimen torsemide on 02/14/2023.   -Due to worsening renal function torsemide was discontinued on 02/15/2023.   -Urinalysis done with small leukocytes, nitrite negative, protein negative, 6-10 WBCs.  Urine sodium of 27, urine creatinine of 97.  -Renal ultrasound done was negative for hydronephrosis.  -Torsemide discontinued.  -Nephrology consulted, patient seen in consultation by Dr. Signe Colt and due to worsening renal function Lasix challenge ordered and patient received Lasix 80 mg IV x 1 on 02/16/2023 with a urine output of 1.1 L over the past 24 hours, however patient still with worsening renal function and recommending hemodialysis tomorrow, [facility] ordered per nephrology in anticipation of HD tomorrow, 02/18/2023. -Nephrology following and appreciate their input and recommendations.  5.  General anxiety disorder -Continue BuSpar, Ativan.    6.  Chronic back pain -Norco.   7.  OSA/chronic hypoxic respiratory failure -CPAP nightly, supplemental O2.  8.  Chronic HFpEF -Compensated on presentation. -Torsemide held and patient started on gentle hydration in anticipation of cardiac catheterization on 02/13/2023, however due to dyspnea on exertion, concern for volume overload IV fluids were discontinued and patient received a dose of Lasix 40 mg IV x 1 with a urine output of 1.550 L.   -Home regimen torsemide resumed 02/14/2023 per cardiology recommendations however due to worsening renal function, torsemide was discontinued.  -Per cardiology.    9.  Moderate MR status post transcatheter edge-to-edge repair of mitral valve -Stable. -Per cardiology.  10.  Gout -Continue allopurinol.  11.  Chronic leukocytosis -Patient with a chronic  leukocytosis, white count at 11.9 and currently stable. -Patient afebrile, no signs or symptoms of infection. -No need for antibiotics at this time. -Follow.   DVT prophylaxis: Heparin drip Code Status: Full Family Communication: Updated patient, no family at bedside.  Disposition: Home once renal function improves and patient cleared by nephrology.  Status is: Inpatient Remains inpatient appropriate because: Severity of illness   Consultants:  Cardiology: Dr. Excell Seltzer 02/12/2023 Nephrology: Dr. Signe Colt 02/15/2023  Procedures:  Chest x-ray 02/12/2023, 02/15/2023 Cardiac catheterization 02/13/2023-per Dr. Clifton James Renal ultrasound 02/15/2023   Antimicrobials:  Anti-infectives (From admission, onward)    Start     Dose/Rate Route Frequency Ordered Stop   02/12/23 2200  cephALEXin (KEFLEX) capsule 250 mg        250 mg Oral Daily at bedtime 02/12/23 1941           Subjective: Patient laying in bed.  States her nerves are torn up and she just alert her mother hospitalized yesterday at [facility] in [place] close to [place] after a fall with a femur fracture.  Denies any significant shortness of breath.  Denies any chest pain.  No abdominal pain.  States she has saw nephrology and plan was for probable hemodialysis tomorrow.  Nausea controlled on Compazine.       Objective: Vitals:   02/16/23 2300 02/16/23 2326 02/17/23 0359 02/17/23 0737  BP:  (!) 132/56 (!) 134/53 (!) 160/59  Pulse:  72 69 73  Resp:  13 11 11   Temp:  98.2 F (36.8 C) 97.8 F (36.6 C) 98.2 F (36.8 C)  TempSrc:  Oral Oral Oral  SpO2: 97% 98% 99% 94%  Weight:   103.4 kg  Height:        Intake/Output Summary (Last 24 hours) at 02/17/2023 1042 Last data filed at 02/17/2023 0736 Gross per 24 hour  Intake 892 ml  Output 1300 ml  Net -408 ml   Filed Weights   02/15/23 0500 02/16/23 0400 02/17/23 0359  Weight: 103.3 kg 103.7 kg 103.4 kg    Examination:  General exam: NAD. Respiratory system: Bibasilar  crackles.  No wheezing.  Fair air movement.  Speaking in full sentences.  Cardiovascular system: Regular rate rhythm no murmurs rubs or gallops.  Trace bilateral lower extremity edema. Gastrointestinal system: Abdomen is soft, nontender, nondistended, positive bowel sounds.  No rebound.  No guarding.  Central nervous system: Alert and oriented.  Moving extremities spontaneously.  No focal neurological deficits. Extremities: Trace bilateral lower extremity edema.  Symmetric 5 x 5 power. Skin: No rashes, lesions or ulcers Psychiatry: Judgement and insight appear normal. Mood & affect appropriate.     Data Reviewed: I have personally reviewed following labs and imaging studies  CBC: Recent Labs  Lab 02/13/23 0202 02/14/23 0247 02/15/23 0037 02/16/23 0018 02/17/23 0043  WBC 13.1* 13.0* 12.8* 11.9* 11.9*  HGB 9.5* 9.4* 9.1* 8.5* 8.1*  HCT 30.0* 29.4* 29.1* 27.5* 25.6*  MCV 85.7 86.0 86.4 87.3 87.4  PLT 228 241 206 207 210    Basic Metabolic Panel: Recent Labs  Lab 02/13/23 0202 02/14/23 0247 02/15/23 0037 02/16/23 0018 02/17/23 0043  NA 136 135 134* 136 135  K 3.7 3.3* 4.2 4.2 4.3  CL 100 100 100 101 102  CO2 24 22 22 24 23   GLUCOSE 338* 235* 219* 108* 203*  BUN 56* 53* 60* 71* 76*  CREATININE 1.88* 2.09* 3.35* 4.81* 5.19*  CALCIUM 8.7* 8.3* 8.7* 8.4* 7.9*  MG 1.8  --   --   --  2.0  PHOS  --   --   --  6.0* 5.8*    GFR: Estimated Creatinine Clearance: 13.5 mL/min (A) (by C-G formula based on SCr of 5.19 mg/dL (H)).  Liver Function Tests: Recent Labs  Lab 02/16/23 0018 02/17/23 0043  ALBUMIN 2.7* 2.7*    CBG: Recent Labs  Lab 02/16/23 1643 02/16/23 1958 02/16/23 2328 02/17/23 0401 02/17/23 0731  GLUCAP 229* 269* 239* 133* 107*     Recent Results (from the past 240 hour(s))  MRSA Next Gen by PCR, Nasal     Status: None   Collection Time: 02/12/23 11:37 PM   Specimen: Nasal Mucosa; Nasal Swab  Result Value Ref Range Status   MRSA by PCR Next Gen NOT  DETECTED NOT DETECTED Final    Comment: (NOTE) The GeneXpert MRSA Assay (FDA approved for NASAL specimens only), is one component of a comprehensive MRSA colonization surveillance program. It is not intended to diagnose MRSA infection nor to guide or monitor treatment for MRSA infections. Test performance is not FDA approved in patients less than 17 years old. Performed at [facility], [address], [place], [place] [address]          Radiology Studies: US RENAL  Result Date: 02/15/2023 CLINICAL DATA:  Acute renal failure EXAM: RENAL / URINARY TRACT ULTRASOUND COMPLETE COMPARISON:  None Available. FINDINGS: Right Kidney: Renal measurements: 10.4 x 4.4 x 8.7 cm = volume: 112.6 mL. Echogenicity within normal limits. No mass or hydronephrosis visualized. There is an echogenic shadowing areas centrally measuring 6 mm. A small stone is possible. Left Kidney: Renal measurements: 11.9 x 4.8 x 5.1 cm = volume: 151.6  mL. Echogenicity within normal limits. No mass or hydronephrosis visualized. Bladder: Appears normal for degree of bladder distention. Other: None. IMPRESSION: No collecting system dilatation. Question small right-sided renal stone. Electronically Signed   By: Karen Kays M.D.   On: 02/15/2023 12:17        Scheduled Meds:  allopurinol  100 mg Oral Daily   aspirin EC  81 mg Oral Daily   busPIRone  10 mg Oral TID   carvedilol  12.5 mg Oral BID WC   cephALEXin  250 mg Oral QHS   insulin aspart  0-6 Units Subcutaneous Q4H   insulin glargine-yfgn  34 Units Subcutaneous Daily   latanoprost  1 drop Both Eyes QHS   linaclotide  145 mcg Oral QAC breakfast   pantoprazole  40 mg Oral Daily   polyethylene glycol  17 g Oral Daily   ticagrelor  90 mg Oral BID   Continuous Infusions:  sodium chloride     nitroGLYCERIN Stopped (02/13/23 2307)     LOS: 5 days    Time spent: 40 minutes    Ramiro Harvest, MD [facility]   To contact the attending provider  between 7A-7P or the covering provider during after hours 7P-7A, please log into the web site [email or web address] and access using universal [place] password for that web site. If you do not have the password, please call the hospital operator.  02/17/2023, 10:42 AM

## 2023-02-17 NOTE — Consult Note (Signed)
Chief Complaint: Patient was seen in consultation today for [facility] placement  Chief Complaint  Patient presents with   Chest Pain   at the request of Signe Colt, E.  Referring Physician(s): Casimiro Needle  Supervising Physician: Mir, Mauri Reading  Patient Status: [facility] - In-pt  History of Present Illness: Dana [name] is a 63 y.o. female with PMHs of anxiety, chronic low back pain, CKD 3B, OSA, chronic hypoxic respiratory failure, IDDM, complex cardiac history of multiple PCI procedures, admitted on 7/30 due to NSTEMI, s/p left heart cath with angioplasty and sent placement on 02/13/23, AKI on CKD, IR was consulted for [facility] placement.   Patient came to ED on 7/30 due to CP, work up showed NSTEMI. Cardiology consulted and patient was started on IV heparin and IV nitroglycerin, underwent left heart cath with angioplasty and stent placement on 02/13/23. She beacme volume overload after cardiac cath, started on IV Lasix, nephrology was consulted on 02/15/23 due to AKI on CKD. RF on 8/2 was BUN 60, creatinine 3.35, GFR 15, RF continued to worsen and today BUN 76, creatinine 5.19, GFR 9. Initiating HD was recommended to the patient which she agreed to proceed, IR was consulted for image guided tunneled HD catheter (TDC/PC) placement.   Patient laying in bed, not in acute distress.  Reports chronic nausea.  Denise headache, fever, chills, shortness of breath, cough, chest pain, abdominal pain, vomiting, and bleeding.  Past Medical History:  Diagnosis Date   Acquired absence of left great toe (HCC)    Acquired absence of other left toe(s) (HCC)    ACS (acute coronary syndrome) (HCC)    Anemia    Anoxic brain injury (HCC)    Anxiety    CAD (coronary artery disease)    DES mLAD 04/07/15; DES dRCA & DES pPDA 08/30/16; DES mCX & PTCA OM1 09/29/20   Chronic diastolic (congestive) heart failure (HCC)    Chronic pain    Chronic systolic (congestive) heart failure (HCC)    CKD (chronic kidney disease)    Contrast  dye induced nephropathy, possible 09/03/2020   COPD (chronic obstructive pulmonary disease) (HCC)    Diabetes (HCC)type 1    Diastolic CHF (HCC)    Dyspnea    Family history of breast cancer 10/23/2021   Gastro-esophageal reflux disease without esophagitis    Hyperlipidemia    Hypertension    Hypertensive heart disease with heart failure (HCC)    Hypoxemia    Idiopathic gout, multiple sites    Leukocytosis 03/29/2022   Localized edema    Low back pain    Mitral regurgitation    Mitral regurgitation    Mixed hyperlipidemia    Mixed incontinence    Morbid (severe) obesity due to excess calories (HCC)    Neuromuscular disorder (HCC)    Neuropathy    Non-ST elevation (NSTEMI) myocardial infarction (HCC)    08/29/20, 09/29/20   OSA (obstructive sleep apnea) 09/03/2020   Other specified hypothyroidism    PAF (paroxysmal atrial fibrillation) (HCC)    s/p pulmonary vein isolation by cryoablation 10/04/15 Dr. Rudolpho Sevin in [facility]   PEA (Pulseless electrical activity) [facility])    ~ 01/13/21 at [facility] during admission DKA, volume overload, possible CAP, hypoxia s/p ACLS with ROSC ~ 10-15   Pneumonia    Primary insomnia    Rheumatoid arthritis (HCC)    Stroke (HCC)    Thyroid disease    Type 1 diabetes mellitus (HCC)    Vitamin D deficiency  Past Surgical History:  Procedure Laterality Date   ABDOMINAL HYSTERECTOMY     Partial- Still has both ovaries   CARDIOVASCULAR STRESS TEST  08/13/2014   Nuclear; Normal   CARPAL TUNNEL RELEASE     CATARACT EXTRACTION, BILATERAL     CESAREAN SECTION     CHOLECYSTECTOMY     CORONARY ANGIOPLASTY WITH STENT PLACEMENT     3 blockages/ 1 stent   CORONARY PRESSURE/FFR STUDY N/A 09/07/2022   Procedure: INTRAVASCULAR PRESSURE WIRE/FFR STUDY;  Surgeon: Swaziland, Peter M, MD;  Location: [facility] INVASIVE CV LAB;  Service: Cardiovascular;  Laterality: N/A;   CORONARY STENT INTERVENTION N/A 09/29/2020   Procedure: CORONARY STENT INTERVENTION;  Surgeon:  Tonny Bollman, MD;  Location: [facility] INVASIVE CV LAB;  Service: Cardiovascular;  Laterality: N/A;  CFX   CORONARY STENT INTERVENTION N/A 09/07/2022   Procedure: CORONARY STENT INTERVENTION;  Surgeon: Swaziland, Peter M, MD;  Location: [facility] INVASIVE CV LAB;  Service: Cardiovascular;  Laterality: N/A;   CORONARY STENT INTERVENTION N/A 02/13/2023   Procedure: CORONARY STENT INTERVENTION;  Surgeon: Kathleene Hazel, MD;  Location: [facility] INVASIVE CV LAB;  Service: Cardiovascular;  Laterality: N/A;  LAD   CORONARY ULTRASOUND/IVUS N/A 02/13/2023   Procedure: Coronary Ultrasound/IVUS;  Surgeon: Kathleene Hazel, MD;  Location: [facility] INVASIVE CV LAB;  Service: Cardiovascular;  Laterality: N/A;   CORONARY/GRAFT ACUTE MI REVASCULARIZATION N/A 12/08/2022   Procedure: Coronary/Graft Acute MI Revascularization;  Surgeon: Marykay Lex, MD;  Location: [facility] INVASIVE CV LAB;  Service: Cardiovascular;  Laterality: N/A;   EYE SURGERY     LEFT HEART CATH AND CORONARY ANGIOGRAPHY N/A 09/29/2020   Procedure: LEFT HEART CATH AND CORONARY ANGIOGRAPHY;  Surgeon: Tonny Bollman, MD;  Location: [facility] INVASIVE CV LAB;  Service: Cardiovascular;  Laterality: N/A;   LEFT HEART CATH AND CORONARY ANGIOGRAPHY N/A 09/07/2022   Procedure: LEFT HEART CATH AND CORONARY ANGIOGRAPHY;  Surgeon: Swaziland, Peter M, MD;  Location: [facility] INVASIVE CV LAB;  Service: Cardiovascular;  Laterality: N/A;   LEFT HEART CATH AND CORONARY ANGIOGRAPHY N/A 12/08/2022   Procedure: LEFT HEART CATH AND CORONARY ANGIOGRAPHY;  Surgeon: Marykay Lex, MD;  Location: [facility] INVASIVE CV LAB;  Service: Cardiovascular;  Laterality: N/A;   LEFT HEART CATH AND CORONARY ANGIOGRAPHY N/A 02/13/2023   Procedure: LEFT HEART CATH AND CORONARY ANGIOGRAPHY;  Surgeon: Kathleene Hazel, MD;  Location: [facility] INVASIVE CV LAB;  Service: Cardiovascular;  Laterality: N/A;   MITRAL VALVE REPAIR N/A 05/18/2021   Procedure: MITRAL VALVE REPAIR;  Surgeon: Orbie Pyo, MD;  Location: [facility]  INVASIVE CV LAB;  Service: Cardiovascular;  Laterality: N/A;   RIGHT HEART CATH N/A 04/27/2021   Procedure: RIGHT HEART CATH;  Surgeon: Dolores Patty, MD;  Location: [facility] INVASIVE CV LAB;  Service: Cardiovascular;  Laterality: N/A;   RIGHT/LEFT HEART CATH AND CORONARY ANGIOGRAPHY N/A 01/07/2017   Procedure: Right/Left Heart Cath and Coronary Angiography;  Surgeon: Laurey Morale, MD;  Location: [facility] INVASIVE CV LAB;  Service: Cardiovascular;  Laterality: N/A;   ROTATOR CUFF REPAIR     TEE WITHOUT CARDIOVERSION N/A 04/28/2021   Procedure: TRANSESOPHAGEAL ECHOCARDIOGRAM (TEE);  Surgeon: Dolores Patty, MD;  Location: [facility] ENDOSCOPY;  Service: Cardiovascular;  Laterality: N/A;   TEE WITHOUT CARDIOVERSION N/A 05/18/2021   Procedure: TRANSESOPHAGEAL ECHOCARDIOGRAM (TEE);  Surgeon: Orbie Pyo, MD;  Location: [facility] INVASIVE CV LAB;  Service: Cardiovascular;  Laterality: N/A;   TOE AMPUTATION Left    US ECHOCARDIOGRAPHY  05/2017   Normal  Allergies: Naproxen, Shellfish-derived products, Strawberry (diagnostic), Celecoxib, Glucosamine, Iron, Metoclopramide, Metolazone, and Statins  Medications: Prior to Admission medications   Medication Sig Start Date End Date Taking? Authorizing Provider  allopurinol (ZYLOPRIM) 300 MG tablet TAKE ONE TABLET BY MOUTH ONCE DAILY 01/03/23  Yes Cox, Kirsten, MD  aspirin EC 81 MG tablet Take 1 tablet (81 mg total) by mouth daily. Swallow whole. 09/09/22  Yes Cyndi Bender, NP  busPIRone (BUSPAR) 10 MG tablet Take 1 tablet (10 mg total) by mouth 3 (three) times daily. 12/17/22  Yes Cox, Kirsten, MD  carvedilol (COREG) 12.5 MG tablet TAKE ONE TABLET BY MOUTH twice daily 01/03/23  Yes Bensimhon, Bevelyn Buckles, MD  cephALEXin (KEFLEX) 250 MG capsule Take 250 mg by mouth at bedtime. 11/30/22  Yes [provider]  CRANBERRY PO Take 2 tablets by mouth daily.   Yes [provider]  EPINEPHrine 0.3 mg/0.3 mL IJ SOAJ injection Use as directed for life-threatening  allergic reaction. 06/24/18  Yes Padgett, Pilar Grammes, MD  ezetimibe (ZETIA) 10 MG tablet Take 1 tablet (10 mg total) by mouth daily. Patient taking differently: Take 10 mg by mouth every evening. 11/05/22  Yes Cox, Kirsten, MD  HYDROcodone-acetaminophen (NORCO) 7.5-325 MG tablet Take 1 tablet by mouth 3 (three) times daily as needed. 02/01/23  Yes Cox, Kirsten, MD  insulin aspart (NOVOLOG FLEXPEN) 100 UNIT/ML FlexPen 2-10 U before meals based on SSI. 10/09/22  Yes Cox, Kirsten, MD  isosorbide mononitrate (IMDUR) 60 MG 24 hr tablet Take 1 tablet (60 mg total) by mouth daily. Patient taking differently: Take 90 mg by mouth daily. 12/25/22  Yes Milford, Anderson Malta, FNP  latanoprost (XALATAN) 0.005 % ophthalmic solution Place 1 drop into both eyes at bedtime.   Yes [provider]  LEVEMIR FLEXPEN 100 UNIT/ML FlexPen Inject 50 units into THE SKIN daily Patient taking differently: Inject 55 Units into the skin daily. 01/13/23  Yes Cox, Kirsten, MD  linaclotide [facility]) 145 MCG CAPS capsule TAKE ONE CAPSULE BY MOUTH BEFORE BREAKFAST 01/03/23  Yes Cox, Kirsten, MD  LORazepam (ATIVAN) 0.5 MG tablet TAKE ONE TABLET BY MOUTH ONCE daily AS NEEDED FOR ANXIETY 01/23/23  Yes Cox, Kirsten, MD  magnesium oxide (MAG-OX) 400 MG tablet Take 1 tablet (400 mg total) by mouth daily. Take 2 (400 mg) daily=800 mg Patient taking differently: Take 800 mg by mouth daily. Take 2 (400 mg) daily=800 mg 12/16/22  Yes Rolly Salter, MD  nitroGLYCERIN (NITROSTAT) 0.4 MG SL tablet DISSOLVE ONE TABLET UNDER THE TONGUE EVERY 5 MINUTES AS NEEDED FOR CHEST PAIN.  DO NOT EXCEED A TOTAL OF 3 DOSES IN 15 MINUTES 01/21/23  Yes Bensimhon, Bevelyn Buckles, MD  Omega-3 Fatty Acids (FISH OIL PO) Take 1 capsule by mouth daily.   Yes [provider]  pantoprazole (PROTONIX) 40 MG tablet TAKE ONE TABLET BY MOUTH ONCE DAILY 01/03/23  Yes Cox, Kirsten, MD  polyethylene glycol (MIRALAX / GLYCOLAX) 17 g packet Take 17 g by mouth daily. 12/17/22   Yes Rolly Salter, MD  potassium chloride SA (KLOR-CON M) 20 MEQ tablet TAKE ONE TABLET BY MOUTH ONCE DAILY Patient taking differently: Take 20 mEq by mouth 2 (two) times a week. 01/03/23  Yes Cox, Kirsten, MD  Probiotic CAPS Take 1 capsule by mouth daily.   Yes [provider]  promethazine (PHENERGAN) 25 MG tablet Take 1 tablet (25 mg total) by mouth daily as needed. Patient taking differently: Take 25 mg by mouth daily as needed for nausea  or vomiting. 10/22/22  Yes Cox, Kirsten, MD  ticagrelor (BRILINTA) 90 MG TABS tablet Take 1 tablet (90 mg total) by mouth 2 (two) times daily. 12/25/22  Yes Milford, Anderson Malta, FNP  torsemide (DEMADEX) 20 MG tablet TAKE 4 TABLETS BY MOUTH ONCE daily 01/03/23  Yes Milford, Anderson Malta, FNP  Continuous Blood Gluc Sensor (FREESTYLE LIBRE 2 SENSOR) MISC USE TO check blood glucose AS DIRECTED AND CHANGE sensor every 14 DAYS 01/11/22   [provider]  Insulin Pen Needle (BD PEN NEEDLE NANO 2ND GEN) 32G X 4 MM MISC Inject 1 each into the skin in the morning, at noon, in the evening, and at bedtime. 08/30/21   CoxFritzi Mandes, MD  OXYGEN Inhale 2 L into the lungs at bedtime.    [provider]  REPATHA SURECLICK 140 MG/ML SOAJ inject 140mg  intot HEART SKIN every 14 DAYS 12/28/22   Bensimhon, Bevelyn Buckles, MD     Family History  Problem Relation Age of Onset   Breast cancer Mother 58       contralateral breast ca dx 24   Coronary artery disease Mother    Hypertension Mother    Hyperlipidemia Mother    Diabetes type II Mother    Lung cancer Mother 61   Diabetes Father    Hypertension Father    Mental illness Sister    Bipolar disorder Other    Depression Other    Schizophrenia Other    Cancer Other        MGF's sisters x2; unknown type    Social History   Socioeconomic History   Marital status: Married    Spouse name: [name]   Number of children: 2   Years of education: 16   Highest education level: Master's degree (e.g., MA, MS, MEng,  MEd, MSW, MBA)  Occupational History   Occupation: on disability/retired early former Engineer, site  Tobacco Use   Smoking status: Former    Current packs/day: 0.00    Types: Cigarettes    Start date: 37    Quit date: 1984    Years since quitting: 40.6    Passive exposure: Past   Smokeless tobacco: Never  Vaping Use   Vaping status: Never Used  Substance and Sexual Activity   Alcohol use: Never   Drug use: Never   Sexual activity: Not on file  Other Topics Concern   Not on file  Social History Narrative   Not on file   Social Determinants of Health   Financial Resource Strain: Medium Risk (10/30/2022)   Overall Financial Resource Strain (CARDIA)    Difficulty of Paying Living Expenses: Somewhat hard  Food Insecurity: No Food Insecurity (02/13/2023)   Hunger Vital Sign    Worried About Running Out of Food in the Last Year: Never true    Ran Out of Food in the Last Year: Never true  Transportation Needs: No Transportation Needs (02/13/2023)   PRAPARE - Administrator, Civil Service (Medical): No    Lack of Transportation (Non-Medical): No  Physical Activity: Inactive (02/05/2023)   Exercise Vital Sign    Days of Exercise per Week: 0 days    Minutes of Exercise per Session: 0 min  Stress: Stress Concern Present (02/05/2023)   [employer] of Occupational Health - Occupational Stress Questionnaire    Feeling of Stress : To some extent  Social Connections: Moderately Integrated (07/25/2022)   Social Connection and Isolation Panel [NHANES]    Frequency of Communication with Friends  and Family: More than three times a week    Frequency of Social Gatherings with Friends and Family: More than three times a week    Attends Religious Services: More than 4 times per year    Active Member of [employer] or Organizations: No    Attends Banker Meetings: Never    Marital Status: Married     Review of Systems: A 12 point ROS discussed and pertinent positives  are indicated in the HPI above.  All other systems are negative.  Vital Signs: BP (!) 160/59 (BP Location: Left Arm)   Pulse 73   Temp 98.2 F (36.8 C) (Oral)   Resp 11   Ht 5\' 4"  (1.626 m)   Wt 228 lb (103.4 kg)   SpO2 94%   BMI 39.14 kg/m    Physical Exam Vitals and nursing note reviewed.  Constitutional:      General: Patient is not in acute distress.    Appearance: Normal appearance. Patient is not ill-appearing.  HENT:     Head: Normocephalic and atraumatic.     Mouth/Throat:     Mouth: Mucous membranes are moist.     Pharynx: Oropharynx is clear.  Cardiovascular:     Rate and Rhythm: Normal rate and regular rhythm.     Pulses: Normal pulses.     Heart sounds: Normal heart sounds.  Pulmonary:     Effort: Pulmonary effort is normal.     Breath sounds: Normal breath sounds.  Abdominal:     General: Abdomen is flat. Bowel sounds are normal.     Palpations: Abdomen is soft.  Musculoskeletal:     Cervical back: Neck supple.  Skin:    General: Skin is warm and dry.     Coloration: Skin is not jaundiced or pale.  Neurological:     Mental Status: Patient is alert and oriented to person, place, and time.  Psychiatric:        Mood and Affect: Mood normal.        Behavior: Behavior normal.        Judgment: Judgment normal.    MD Evaluation Airway: WNL Heart: WNL Abdomen: WNL Chest/ Lungs: WNL ASA  Classification: 3 Mallampati/Airway Score: Two  Imaging: DG Chest 2 View  Result Date: 02/15/2023 CLINICAL DATA:  Shortness of breath EXAM: CHEST - 2 VIEW COMPARISON:  02/12/2023 and older FINDINGS: Enlarged cardiopericardial silhouette. Mitral valve clips and pulmonary arterial device in place. Vascular congestion. Question trace edema. No pneumothorax or effusion. Overlapping cardiac leads. Possible coronary stent. IMPRESSION: Enlarged heart with vascular congestion question trace edema. Electronically Signed   By: Karen Kays M.D.   On: 02/15/2023 12:18   US  RENAL  Result Date: 02/15/2023 CLINICAL DATA:  Acute renal failure EXAM: RENAL / URINARY TRACT ULTRASOUND COMPLETE COMPARISON:  None Available. FINDINGS: Right Kidney: Renal measurements: 10.4 x 4.4 x 8.7 cm = volume: 112.6 mL. Echogenicity within normal limits. No mass or hydronephrosis visualized. There is an echogenic shadowing areas centrally measuring 6 mm. A small stone is possible. Left Kidney: Renal measurements: 11.9 x 4.8 x 5.1 cm = volume: 151.6 mL. Echogenicity within normal limits. No mass or hydronephrosis visualized. Bladder: Appears normal for degree of bladder distention. Other: None. IMPRESSION: No collecting system dilatation. Question small right-sided renal stone. Electronically Signed   By: Karen Kays M.D.   On: 02/15/2023 12:17   CARDIAC CATHETERIZATION  Result Date: 02/13/2023   Mid LAD to Dist LAD lesion is 60% stenosed.  Mid Cx to Dist Cx lesion is 100% stenosed.   1st Mrg-1 lesion is 30% stenosed.   1st Mrg-2 lesion is 90% stenosed.   Prox LAD to Mid LAD lesion is 95% stenosed.   Prox RCA lesion is 70% stenosed.   Non-stenotic Mid Cx lesion was previously treated.   Non-stenotic Dist RCA lesion was previously treated.   A drug-eluting stent was successfully placed using a STENT ONYX FRONTIER [id number].   Post intervention, there is a 0% residual stenosis. Severe restenosis in the proximal LAD stent. The distal LAD is small in caliber. Successful IVUS guided PTCA/DES x 1 proximal LAD (Onyx stent platform placed over older Synergy stent) Chronic occlusion mid Circumflex beyond the stented segment. Patent obtuse marginal branch with severe distal stenosis (too small for PCI) Dominant RCA with moderate proximal stenosis, patent distal stent. LVEDP=19 mmHg Recommendations: Continue DAPT with ASA and Brilinta.   DG Chest 2 View  Result Date: 02/12/2023 CLINICAL DATA:  Chest pain, shortness of breath, and pain radiating down left arm. History of CHF. EXAM: CHEST - 2 VIEW COMPARISON:   Chest radiographs 12/08/2022, 12/06/2022, 09/07/2022, 05/16/2021, 10/03/2020 FINDINGS: Cardiac silhouette is again moderately markedly enlarged. Mediastinal contours within normal limits. Mild-to-moderate bilateral interstitial thickening, greatest within the inferior lungs is not significantly changed from 05/16/2021. This is improved from most recent 12/08/2022 radiograph. Mitral valve clips and CardioMEMS left pulmonary arterial device are unchanged in position. The posterior costophrenic angles are sharp on lateral view and no definite pleural effusion is seen. There is blunting of the bilateral costophrenic angles on frontal view likely due to chronic interstitial opacification. No pneumothorax is seen. Mild dextrocurvature of the mid to upper thoracic spine with moderate multilevel disc space narrowing. IMPRESSION: 1. Moderate cardiomegaly. 2. Mild-to-moderate bilateral interstitial thickening, greatest within the inferior lungs is not significantly changed from 05/16/2021. This is improved from most recent 12/08/2022 radiograph. This may represent the patient's normal baseline and some interstitial thickening/scarring. It may represent minimal interstitial pulmonary edema, although this would be the least interstitial pulmonary edema seen on numerous comparison radiographs. Electronically Signed   By: Neita Garnet M.D.   On: 02/12/2023 17:13    Labs:  CBC: Recent Labs    02/14/23 0247 02/15/23 0037 02/16/23 0018 02/17/23 0043  WBC 13.0* 12.8* 11.9* 11.9*  HGB 9.4* 9.1* 8.5* 8.1*  HCT 29.4* 29.1* 27.5* 25.6*  PLT 241 206 207 210    COAGS: No results for input(s): "INR", "APTT" in the last 8760 hours.  BMP: Recent Labs    02/14/23 0247 02/15/23 0037 02/16/23 0018 02/17/23 0043  NA 135 134* 136 135  K 3.3* 4.2 4.2 4.3  CL 100 100 101 102  CO2 22 22 24 23   GLUCOSE 235* 219* 108* 203*  BUN 53* 60* 71* 76*  CALCIUM 8.3* 8.7* 8.4* 7.9*  CREATININE 2.09* 3.35* 4.81* 5.19*  GFRNONAA  27* 15* 10* 9*    LIVER FUNCTION TESTS: Recent Labs    09/07/22 1013 09/17/22 1206 11/08/22 1024 12/06/22 1045 12/10/22 0231 12/16/22 0228 12/19/22 0823 02/16/23 0018 02/17/23 0043  BILITOT 0.7 0.3  --  0.5  --   --  0.4  --   --   AST 33 13  --  17  --   --  12  --   --   ALT 32 14  --  18  --   --  9  --   --   ALKPHOS 159* 188*  --  139*  --   --  127*  --   --   PROT 7.4 7.7  --  7.3  --   --  7.1  --   --   ALBUMIN 3.3* 4.1   < > 3.2*   < > 3.0* 3.8 2.7* 2.7*   < > = values in this interval not displayed.    TUMOR MARKERS: No results for input(s): "AFPTM", "CEA", "CA199", "CHROMGRNA" in the last 8760 hours.  Assessment and Plan: 62 y.o. female with recent left cardiac cath s/p angioplasty and sent placement on 7/31, AKI on CKD with worsening RF who presents for [facility] placement.   VS HTN but stable, no fever since 8/1  Persistent leukocytosis, down trending, pt on keflex 250 mg po qd for UTI - discussed with Dr. Charlesetta Shanks, OK to proceed with [facility] placement.  Allergies reviewed  On DAPT with Brilinta and ASA, no need for d/c 2 g Ancef to be given in IR   Risks and benefits discussed with the patient including, but not limited to bleeding, infection, vascular injury, pneumothorax which may require chest tube placement, air embolism or even death  All of the patient's questions were answered, patient is agreeable to proceed. Consent signed and in chart.  The procedure is tentatively scheduled for tomorrow pending IR schedule, patient was inform that she can be NPO till late afternoon. She verbalized understanding.   PLAN -NPO at [facility] - 2 g Ancef to be given in IR    Thank you for this interesting consult.  I greatly enjoyed meeting HAGEN TIDD and look forward to participating in their care.  A copy of this report was sent to the requesting provider on this date.  Electronically Signed: Willette Brace, PA-C 02/17/2023, 2:15 PM   I spent a total of 20 Minutes    in face  to face in clinical consultation, greater than 50% of which was counseling/coordinating care for [facility] placement.   This chart was dictated using voice recognition software.  Despite best efforts to proofread,  errors can occur which can change the documentation meaning.

## 2023-02-17 NOTE — Progress Notes (Signed)
   02/16/23 2300  BiPAP/CPAP/SIPAP  $ Non-Invasive Home Ventilator  Subsequent  BiPAP/CPAP/SIPAP Pt Type Adult  BiPAP/CPAP/SIPAP Resmed  Mask Type Nasal mask  Patient Home Equipment Yes  CPAP/SIPAP surface wiped down Yes  Safety Check Completed by RT for Home Unit Yes, no issues noted  BiPAP/CPAP /SiPAP Vitals  SpO2 97 %

## 2023-02-17 NOTE — Progress Notes (Addendum)
Addendum to the consult note:  Patient states that she has chronic nausea, takes promethazine at home.  Tried Zofran and did not work, she is on IV Compazine 5 mg q6h PRN.  IR will consider administering the Compazine at the end of the procedure to prevent post procedural N/V.    Lynann Bologna  PA-C 02/17/2023 8:01 PM

## 2023-02-18 DIAGNOSIS — I214 Non-ST elevation (NSTEMI) myocardial infarction: Secondary | ICD-10-CM | POA: Diagnosis not present

## 2023-02-18 DIAGNOSIS — N1832 Chronic kidney disease, stage 3b: Secondary | ICD-10-CM | POA: Diagnosis not present

## 2023-02-18 DIAGNOSIS — I5032 Chronic diastolic (congestive) heart failure: Secondary | ICD-10-CM | POA: Diagnosis not present

## 2023-02-18 DIAGNOSIS — M1A9XX0 Chronic gout, unspecified, without tophus (tophi): Secondary | ICD-10-CM | POA: Diagnosis not present

## 2023-02-18 LAB — GLUCOSE, CAPILLARY
Glucose-Capillary: 163 mg/dL — ABNORMAL HIGH (ref 70–99)
Glucose-Capillary: 101 mg/dL — ABNORMAL HIGH (ref 70–99)
Glucose-Capillary: 161 mg/dL — ABNORMAL HIGH (ref 70–99)
Glucose-Capillary: 209 mg/dL — ABNORMAL HIGH (ref 70–99)
Glucose-Capillary: 226 mg/dL — ABNORMAL HIGH (ref 70–99)
Glucose-Capillary: 210 mg/dL — ABNORMAL HIGH (ref 70–99)

## 2023-02-18 MED ORDER — DARBEPOETIN ALFA 150 MCG/0.3ML IJ SOSY
150.0000 ug | PREFILLED_SYRINGE | INTRAMUSCULAR | Status: DC
  Administered 2023-02-18: 150 ug via SUBCUTANEOUS
  Filled 2023-02-18: qty 0.3

## 2023-02-18 MED ORDER — CEFAZOLIN SODIUM-DEXTROSE 2-4 GM/100ML-% IV SOLN: 2.0000 g | INTRAVENOUS | Status: AC

## 2023-02-18 NOTE — Progress Notes (Signed)
IR procedure request for tunneled hemodialysis catheter cancelled per the request  of Dr. Annie Sable. Should this or any additional IR procedure be warranted please re-consult IR.

## 2023-02-18 NOTE — Progress Notes (Signed)
[facility] Progress Note   Assessment/ Plan:    AKI on CKD 3b:  baseline crt 1.7.  Has also had AKI in the recent past-  June-  did not require dialysis at that time - in the setting of NSTEMI and cath - still making urine-- had dropped off but then better last 24 hours also with pretty significant improvement in crt from 5.2 to 4.3.  Plan had been to proceed with [facility] and HD today but given the events I think we can hold off-  she is ok with this -  continue to follow closely -  she already had follow up with me in September   2.  NSTEMI:             - s/p severe restenosis of prox LAD lesion  - on Brilinta   3.  DM I             - per primary   4.  S/p mitraclip 2022  5. Anemia-  some due to CKD-  also procedure-  will check iron stores and add ESA      Subjective:    Cr quite a bit better today as is UOP so we will put plans to start dialysis on hold for now    Objective:   BP (!) 112/47 (BP Location: Left Arm)   Pulse 67   Temp 98 F (36.7 C) (Oral)   Resp 14   Ht 5\' 4"  (1.626 m)   Wt 102.4 kg   SpO2 94%   BMI 38.76 kg/m   Intake/Output Summary (Last 24 hours) at 02/18/2023 1238 Last data filed at 02/18/2023 0354 Gross per 24 hour  Intake --  Output 1300 ml  Net -1300 ml   Weight change: -0.998 kg  Physical Exam: GEN NAD, sitting in bed HEENT EOMI PERRL NECK no JVD PULM normal WOB, wearing O2, some basilar crackles on R, not so much on L CV RRR ABD: soft EXT trace LE edema NEURO  AAO x 3   Imaging: No results found.  Labs: BMET Recent Labs  Lab 02/12/23 1501 02/13/23 0202 02/14/23 0247 02/15/23 0037 02/16/23 0018 02/17/23 0043 02/18/23 0016  NA 135 136 135 134* 136 135 134*  K 3.8 3.7 3.3* 4.2 4.2 4.3 4.3  CL 100 100 100 100 101 102 100  CO2 21* 24 22 22 24 23 23   GLUCOSE 456* 338* 235* 219* 108* 203* 249*  BUN 58* 56* 53* 60* 71* 76* 70*  CREATININE 1.95* 1.88* 2.09* 3.35* 4.81* 5.19* 4.31*  CALCIUM 8.8* 8.7* 8.3* 8.7*  8.4* 7.9* 8.1*  PHOS  --   --   --   --  6.0* 5.8* 5.0*   CBC Recent Labs  Lab 02/15/23 0037 02/16/23 0018 02/17/23 0043 02/18/23 0016  WBC 12.8* 11.9* 11.9* 11.7*  HGB 9.1* 8.5* 8.1* 8.3*  HCT 29.1* 27.5* 25.6* 26.6*  MCV 86.4 87.3 87.4 86.4  PLT 206 207 210 225    Medications:     allopurinol  100 mg Oral Daily   aspirin EC  81 mg Oral Daily   busPIRone  10 mg Oral TID   carvedilol  12.5 mg Oral BID WC   cephALEXin  250 mg Oral QHS   Chlorhexidine Gluconate Cloth  6 each Topical Q0600   insulin aspart  0-6 Units Subcutaneous Q4H   insulin glargine-yfgn  34 Units Subcutaneous Daily   latanoprost  1 drop Both Eyes QHS   linaclotide  145 mcg Oral QAC breakfast   pantoprazole  40 mg Oral Daily   polyethylene glycol  17 g Oral Daily   ticagrelor  90 mg Oral BID   Cecille Aver  02/18/2023, 12:38 PM

## 2023-02-18 NOTE — Progress Notes (Signed)
   02/17/23 2333  BiPAP/CPAP/SIPAP  $ Non-Invasive Home Ventilator  Subsequent  BiPAP/CPAP/SIPAP Pt Type Adult  Mask Type Nasal mask  Flow Rate 2 lpm  Patient Home Equipment Yes  Safety Check Completed by RT for Home Unit Yes, no issues noted  BiPAP/CPAP /SiPAP Vitals  Temp 98 F (36.7 C)  Pulse Rate 68  Resp 18  BP (!) 119/44  SpO2 93 %  MEWS Score/Color  MEWS Score 0  MEWS Score Color Green

## 2023-02-18 NOTE — Progress Notes (Signed)
PROGRESS NOTE    Dana [name]  YNW:[id number] DOB: 10-Oct-1960 DOA: 02/12/2023 PCP: Blane Ohara, MD    Chief Complaint  Patient presents with   Chest Pain    Brief Narrative:  Patient is a 62 year old female history of anxiety, chronic low back pain, CKD 3B, OSA, chronic hypoxic respiratory failure, IDDM, complex cardiac history of multiple PCI procedures and complications including contrast-induced nephropathy presented to the ED with chest pain. -Patient noted to have substernal chest pressure with ambulation ongoing despite recent increase in her isosorbide.  Patient took 3 nitroglycerin prior to presentation with ongoing symptoms.  Patient seen in the ED noted to have sats in the mid 90s on 4 L nasal cannula, EKG showed normal sinus rhythm right axis deviation, chest x-ray with cardiomegaly and chronic interstitial thickening.  Troponin noted to be elevated at 107 increased to 941. -Patient seen in consultation by cardiology, patient placed on a heparin drip and nitroglycerin drips, and cardiology recommended cardiac catheterization 02/13/2023 for further evaluation.   Assessment & Plan:   Principal Problem:   NSTEMI (non-ST elevated myocardial infarction) (HCC) Active Problems:   Acute renal failure superimposed on stage 3b chronic kidney disease (HCC)   Chronic pain syndrome   Chronic heart failure with preserved ejection fraction (HFpEF) (HCC)   Chronic respiratory failure with hypoxia (HCC)   Chronic gout without tophus   Uncomplicated opioid dependence (HCC)   OSA on CPAP   Insulin dependent type 2 diabetes mellitus (HCC)   CKD stage 3b, GFR 30-44 ml/min (HCC)   GAD (generalized anxiety disorder)  #1 Non-STEMI -Patient presented with chest pain on exertion with no improvement despite recent increase in isosrbide, as well as nitroglycerin. -Patient with some symptomatic improvement on IV heparin and IV nitroglycerin drip. -Due to patient's complex cardiac history  cardiology consulted who assessed the patient and recommended cardiac catheterization, 02/13/2023. -Cardiac catheterization done showed severe restenosis in the proximal LAD stent status post successful IVUS guided PTCA/DES x 1 proximal LAD.  Chronic occlusion mid circumflex beyond stented segment.  Patent obtuse marginal branch with severe distal stenosis which was too small for PCI.  Dominant RCA with moderate proximal stenosis, patent distal stent noted. -Clinical improvement postcardiac catheterization. -Cardiology recommending dual antiplatelet therapy with aspirin and Brilinta.  Continue Coreg. -IV nitroglycerin drip, IV heparin discontinued per cardiology. -Patient started back on home regimen torsemide on 02/14/2023 however due to worsening renal function torsemide has been discontinued.   -Cardiology was following but have signed off as of 02/15/2023.   -Will need close outpatient follow-up with cardiology on discharge.   2.  Dyspnea -, Likely secondary to IV fluids patient was placed on with gentle hydration in addition to holding her diuretics. -Dyspnea improved after Lasix 40 mg IV x 1 given on 02/13/2023.   -Patient status post cardiac catheterization, 02/13/2023.  -Patient denies any significant shortness of breath at this time.  3.  Insulin-dependent diabetes mellitus 1 -Hemoglobin A1c 8.5 (02/13/2023). -CBG 101 this morning. -Patient noted to be on sliding scale NovoLog, Levemir 55 units daily prior to admission. -Continue Semglee 34 units daily, SSI.    4.  Acute kidney injury on CKD stage IIIb -Patient noted to have worsening renal function with creatinine currently at 4.31 from 5.19 from 4.81 from 3.35 from 2.09 from 1.88 from 1.95 on admission.  -Baseline creatinine approximately 1.7-2. -Renal function was trending up however today seems as if renal function is starting to trend back down. -Urine output of 1.5 L  over the past 24 hours. -Patient noted to have multiple  contrast-induced nephropathy post cath in the past. -Patient was placed on gentle hydration and torsemide held in anticipation of cardiac catheterization on presentation. -Patient with dyspnea on exertion the morning of 02/13/2023, IV fluids discontinued and patient given a dose of IV Lasix with good urine output.   -Post cath creatinine now at 4.31 from.19 from 4.81 from 3.35 from 2.09 from 1.88.  -IV Lasix discontinued and patient was resumed on home regimen torsemide on 02/14/2023.   -Due to worsening renal function torsemide was discontinued on 02/15/2023.   -Urinalysis done with small leukocytes, nitrite negative, protein negative, 6-10 WBCs.  Urine sodium of 27, urine creatinine of 97.  -Renal ultrasound done was negative for hydronephrosis.  -Torsemide discontinued.  -Nephrology consulted, patient seen in consultation by Dr. Signe Colt and due to worsening renal function Lasix challenge ordered and patient received Lasix 80 mg IV x 1 on 02/16/2023 with a urine output of 1.1 L, however patient still with worsening renal function on 02/17/2023 and as such nephrology had recommended placement of hemodialysis catheter and initiation of hemodialysis today 02/18/2023.  -Renal function however seems to be trending back down, patient still with good urine output nephrology has reassessed the patient and they are holding off hemodialysis at this time and monitoring.  -Nephrology following and appreciate their input and recommendations.  5.  General anxiety disorder -BuSpar, Ativan.    6.  Chronic back pain -Norco.   7.  OSA/chronic hypoxic respiratory failure -CPAP nightly, supplemental O2.  8.  Chronic HFpEF -Compensated on presentation. -Torsemide held and patient started on gentle hydration in anticipation of cardiac catheterization on 02/13/2023, however due to dyspnea on exertion, concern for volume overload IV fluids were discontinued and patient received a dose of Lasix 40 mg IV x 1 with a urine output of  1.550 L.   -Home regimen torsemide resumed 02/14/2023 per cardiology recommendations however due to worsening renal function, torsemide was discontinued.  -Per cardiology.    9.  Moderate MR status post transcatheter edge-to-edge repair of mitral valve -Stable. -Per cardiology.  10.  Gout -Allopurinol.   11.  Chronic leukocytosis -Patient with a chronic leukocytosis, WBC at 11.7, currently stable.  -Afebrile, no signs or symptoms of infection.   -Hold off on antibiotics at this time.   -Follow.   DVT prophylaxis: Heparin drip Code Status: Full Family Communication: Updated patient, daughter at bedside. Disposition: Home once renal function improves and patient cleared by nephrology.  Status is: Inpatient Remains inpatient appropriate because: Severity of illness   Consultants:  Cardiology: Dr. Excell Seltzer 02/12/2023 Nephrology: Dr. Signe Colt 02/15/2023  Procedures:  Chest x-ray 02/12/2023, 02/15/2023 Cardiac catheterization 02/13/2023-per Dr. Clifton James Renal ultrasound 02/15/2023   Antimicrobials:  Anti-infectives (From admission, onward)    Start     Dose/Rate Route Frequency Ordered Stop   02/18/23 0800  ceFAZolin (ANCEF) IVPB 2g/100 mL premix       Note to Pharmacy: TO IR   2 g 200 mL/hr over 30 Minutes Intravenous To Radiology 02/18/23 0454 02/19/23 0800   02/12/23 2200  cephALEXin (KEFLEX) capsule 250 mg        250 mg Oral Daily at bedtime 02/12/23 1941           Subjective: Patient sitting up in chair.  Daughter at bedside.  Overall feels well.  States making urine.  States no significant shortness of breath.  Stated ambulated in hallway with cardiac rehab and may have gotten  short winded only twice however sats remained above 92%.  Denies any chest pain.  No abdominal pain.  Tolerating oral intake.  States she was happy to hear from nephrology that hemodialysis currently on hold for now as it looks like her kidneys are beginning to jumpstart.    Objective: Vitals:   02/17/23  2333 02/18/23 0348 02/18/23 0804 02/18/23 0819  BP: (!) 119/44 127/62 130/62 130/62  Pulse: 68 72 71 73  Resp: 18 15 17    Temp: 98 F (36.7 C) 98 F (36.7 C) 98.2 F (36.8 C)   TempSrc: Oral Oral Oral   SpO2: 93% 98% 93%   Weight:  102.4 kg    Height:        Intake/Output Summary (Last 24 hours) at 02/18/2023 1037 Last data filed at 02/18/2023 0354 Gross per 24 hour  Intake --  Output 1300 ml  Net -1300 ml   Filed Weights   02/16/23 0400 02/17/23 0359 02/18/23 0348  Weight: 103.7 kg 103.4 kg 102.4 kg    Examination:  General exam: NAD. Respiratory system: Improving bibasilar crackles.  No wheezing.  Fair air movement.  Speaking in full sentences.   Cardiovascular system: RRR no murmurs rubs or gallops.  Trace bilateral lower extremity edema.  Gastrointestinal system: Abdomen is soft, nontender, nondistended, positive bowel sounds.  No rebound.  No guarding.  Central nervous system: Alert and oriented.  Moving extremities spontaneously.  No focal neurological deficits. Extremities: Trace bilateral lower extremity edema.  Symmetric 5 x 5 power. Skin: No rashes, lesions or ulcers Psychiatry: Judgement and insight appear normal. Mood & affect appropriate.     Data Reviewed: I have personally reviewed following labs and imaging studies  CBC: Recent Labs  Lab 02/14/23 0247 02/15/23 0037 02/16/23 0018 02/17/23 0043 02/18/23 0016  WBC 13.0* 12.8* 11.9* 11.9* 11.7*  HGB 9.4* 9.1* 8.5* 8.1* 8.3*  HCT 29.4* 29.1* 27.5* 25.6* 26.6*  MCV 86.0 86.4 87.3 87.4 86.4  PLT 241 206 207 210 225    Basic Metabolic Panel: Recent Labs  Lab 02/13/23 0202 02/14/23 0247 02/15/23 0037 02/16/23 0018 02/17/23 0043 02/18/23 0016  NA 136 135 134* 136 135 134*  K 3.7 3.3* 4.2 4.2 4.3 4.3  CL 100 100 100 101 102 100  CO2 24 22 22 24 23 23   GLUCOSE 338* 235* 219* 108* 203* 249*  BUN 56* 53* 60* 71* 76* 70*  CREATININE 1.88* 2.09* 3.35* 4.81* 5.19* 4.31*  CALCIUM 8.7* 8.3* 8.7* 8.4*  7.9* 8.1*  MG 1.8  --   --   --  2.0  --   PHOS  --   --   --  6.0* 5.8* 5.0*    GFR: Estimated Creatinine Clearance: 16.2 mL/min (A) (by C-G formula based on SCr of 4.31 mg/dL (H)).  Liver Function Tests: Recent Labs  Lab 02/16/23 0018 02/17/23 0043 02/18/23 0016  ALBUMIN 2.7* 2.7* 2.7*    CBG: Recent Labs  Lab 02/17/23 1651 02/17/23 1959 02/17/23 2335 02/18/23 0350 02/18/23 0804  GLUCAP 158* 251* 245* 163* 101*     Recent Results (from the past 240 hour(s))  MRSA Next Gen by PCR, Nasal     Status: None   Collection Time: 02/12/23 11:37 PM   Specimen: Nasal Mucosa; Nasal Swab  Result Value Ref Range Status   MRSA by PCR Next Gen NOT DETECTED NOT DETECTED Final    Comment: (NOTE) The GeneXpert MRSA Assay (FDA approved for NASAL specimens only), is one component of a  comprehensive MRSA colonization surveillance program. It is not intended to diagnose MRSA infection nor to guide or monitor treatment for MRSA infections. Test performance is not FDA approved in patients less than 95 years old. Performed at [facility], [address], [place], [place] [address]          Radiology Studies: No results found.      Scheduled Meds:  allopurinol  100 mg Oral Daily   aspirin EC  81 mg Oral Daily   busPIRone  10 mg Oral TID   carvedilol  12.5 mg Oral BID WC   cephALEXin  250 mg Oral QHS   Chlorhexidine Gluconate Cloth  6 each Topical Q0600   insulin aspart  0-6 Units Subcutaneous Q4H   insulin glargine-yfgn  34 Units Subcutaneous Daily   latanoprost  1 drop Both Eyes QHS   linaclotide  145 mcg Oral QAC breakfast   pantoprazole  40 mg Oral Daily   polyethylene glycol  17 g Oral Daily   ticagrelor  90 mg Oral BID   Continuous Infusions:  sodium chloride      ceFAZolin (ANCEF) IV     nitroGLYCERIN Stopped (02/13/23 2307)     LOS: 6 days    Time spent: 40 minutes    Ramiro Harvest, MD [facility]   To contact the attending  provider between 7A-7P or the covering provider during after hours 7P-7A, please log into the web site [email or web address] and access using universal [place] password for that web site. If you do not have the password, please call the hospital operator.  02/18/2023, 10:37 AM

## 2023-02-18 NOTE — Progress Notes (Signed)
Mobility Specialist Progress Note:   02/18/23 1300  Mobility  Activity Ambulated with assistance in hallway  Level of Assistance Contact guard assist, steadying assist  Assistive Device Front wheel walker  Distance Ambulated (ft) 130 ft  Activity Response Tolerated well  Mobility Referral Yes  $Mobility charge 1 Mobility  Mobility Specialist Start Time (ACUTE ONLY) 1328  Mobility Specialist Stop Time (ACUTE ONLY) 1339  Mobility Specialist Time Calculation (min) (ACUTE ONLY) 11 min    Pre Mobility: 76 HR, 94% SpO2 During Mobility: 84 HR Post Mobility:  72 HR, 96% SpO2  Pt received in chair, agreeable to mobility. Took x2 standing rest breaks w/ increased distance. Asymptomatic throughout. Pt left in chair with call bell and family present.  D'Vante Earlene Plater Mobility Specialist Please contact via Special educational needs teacher or Rehab office at [phone]

## 2023-02-18 NOTE — Progress Notes (Signed)
CARDIAC REHAB PHASE I   PRE:  Rate/Rhythm: 78 SR    BP: sitting 133/57    SpO2: 96 RA  MODE:  Ambulation: 170 ft   POST:  Rate/Rhythm: 82 SR    BP: sitting 145/58     SpO2: 94 RA  Pt ambulated with RW, standby assist, slow pace. Rest x3 for SOB. Return to EOB, VSS. Generally feeling better today.  [id number]   Ethelda Chick BS, ACSM-CEP 02/18/2023 10:23 AM

## 2023-02-19 ENCOUNTER — Other Ambulatory Visit: Payer: PPO

## 2023-02-19 ENCOUNTER — Other Ambulatory Visit (HOSPITAL_COMMUNITY): Payer: Self-pay | Admitting: Family Medicine

## 2023-02-19 DIAGNOSIS — N1832 Chronic kidney disease, stage 3b: Secondary | ICD-10-CM | POA: Diagnosis not present

## 2023-02-19 DIAGNOSIS — I5032 Chronic diastolic (congestive) heart failure: Secondary | ICD-10-CM | POA: Diagnosis not present

## 2023-02-19 DIAGNOSIS — I214 Non-ST elevation (NSTEMI) myocardial infarction: Secondary | ICD-10-CM | POA: Diagnosis not present

## 2023-02-19 DIAGNOSIS — M1A9XX0 Chronic gout, unspecified, without tophus (tophi): Secondary | ICD-10-CM | POA: Diagnosis not present

## 2023-02-19 LAB — GLUCOSE, CAPILLARY
Glucose-Capillary: 107 mg/dL — ABNORMAL HIGH (ref 70–99)
Glucose-Capillary: 110 mg/dL — ABNORMAL HIGH (ref 70–99)
Glucose-Capillary: 156 mg/dL — ABNORMAL HIGH (ref 70–99)
Glucose-Capillary: 172 mg/dL — ABNORMAL HIGH (ref 70–99)
Glucose-Capillary: 184 mg/dL — ABNORMAL HIGH (ref 70–99)
Glucose-Capillary: 92 mg/dL (ref 70–99)

## 2023-02-19 MED ORDER — SODIUM CHLORIDE 0.9 % IV SOLN
250.0000 mg | Freq: Every day | INTRAVENOUS | Status: DC
  Administered 2023-02-19 – 2023-02-20 (×2): 250 mg via INTRAVENOUS
  Filled 2023-02-19 (×2): qty 20

## 2023-02-19 MED ORDER — DIPHENHYDRAMINE HCL 25 MG PO CAPS
25.0000 mg | ORAL_CAPSULE | Freq: Four times a day (QID) | ORAL | Status: DC | PRN
  Administered 2023-02-19 – 2023-02-20 (×2): 25 mg via ORAL
  Filled 2023-02-19 (×2): qty 1

## 2023-02-19 NOTE — Progress Notes (Signed)
CARDIAC REHAB PHASE I   Pt sitting on side of bed, feeling well today. Just returned from walk in hallway, reports she was able to go twice as far today compared to previous walks. Pt hopeful for discharge tomorrow. Looking froward to returning to [facility] program in [place] when medically cleared for return. Will continue to follow.   0940-1000      Woodroe Chen, RN BSN 02/19/2023 10:12 AM

## 2023-02-19 NOTE — Progress Notes (Signed)
Mobility Specialist Progress Note:   02/19/23 1000  Mobility  Activity Ambulated with assistance in hallway  Level of Assistance Contact guard assist, steadying assist  Assistive Device Four wheel walker  Distance Ambulated (ft) 340 ft  Activity Response Tolerated well  Mobility Referral Yes  $Mobility charge 1 Mobility  Mobility Specialist Start Time (ACUTE ONLY) [phone]  Mobility Specialist Stop Time (ACUTE ONLY) 0945  Mobility Specialist Time Calculation (min) (ACUTE ONLY) 21 min    Pre Mobility: 72 HR,  95% SpO2 During Mobility: 85 HR, 93% SpO2 Post Mobility:  79 HR, 93% SpO2  Pt received in bed, eager to mobility. C/o of some fatigue with increased distance, requiring x1 seated rest break. Otherwise asymptomatic w/o incident. Pt left in bed with call bell and all needs met.  Dana [name] Mobility Specialist Please contact via Special educational needs teacher or Rehab office at [phone]

## 2023-02-19 NOTE — Progress Notes (Signed)
[name] Progress Note   Assessment/ Plan:    AKI on CKD 3b:  baseline crt 1.7.  Has also had AKI in the recent past-  June-  did not require dialysis at that time - in the setting of NSTEMI and cath -  making urine-- had dropped off but then better last 48 hours also with pretty significant improvement in crt from 5.2 to 4.3, now 2.9.  Plan had been to proceed with [facility] and HD but given the events I think we can continue to hold off-  suspect will not be needed at all this hosp -  continue to follow closely -  she already had follow up with me in September.  I would say that if crt trends down again tomorrow I would be comfortable with discharge   2.  NSTEMI:             - s/p severe restenosis of prox LAD lesion  - on Brilinta   3.  DM I             - per primary   4.  HTN/vol-  is autodiuresing so far but will need to resume torsemide at discharge   5. Anemia-  some due to CKD-  also procedure-   iron stores low-  will replete-  can get dose today and tomorrow   and added ESA      Subjective:    Cr quite a bit better again today as is UOP -  at least 1700   Objective:   BP (!) 144/57   Pulse 74   Temp (P) 98.9 F (37.2 C) (Oral)   Resp 12   Ht 5\' 4"  (1.626 m)   Wt 102.9 kg   SpO2 95%   BMI 38.94 kg/m   Intake/Output Summary (Last 24 hours) at 02/19/2023 0834 Last data filed at 02/19/2023 0524 Gross per 24 hour  Intake --  Output 1700 ml  Net -1700 ml   Weight change: 0.478 kg  Physical Exam: GEN NAD, sitting in bed HEENT EOMI PERRL NECK no JVD PULM normal WOB, wearing O2, some basilar crackles on R, not so much on L CV RRR ABD: soft EXT trace- 1+  LE edema NEURO  AAO x 3   Imaging: No results found.  Labs: BMET Recent Labs  Lab 02/13/23 0202 02/14/23 0247 02/15/23 0037 02/16/23 0018 02/17/23 0043 02/18/23 0016 02/19/23 0308  NA 136 135 134* 136 135 134* 138  K 3.7 3.3* 4.2 4.2 4.3 4.3 4.3  CL 100 100 100 101 102 100 104  CO2  24 22 22 24 23 23 24   GLUCOSE 338* 235* 219* 108* 203* 249* 125*  BUN 56* 53* 60* 71* 76* 70* 56*  CREATININE 1.88* 2.09* 3.35* 4.81* 5.19* 4.31* 2.94*  CALCIUM 8.7* 8.3* 8.7* 8.4* 7.9* 8.1* 8.4*  PHOS  --   --   --  6.0* 5.8* 5.0* 4.5   CBC Recent Labs  Lab 02/16/23 0018 02/17/23 0043 02/18/23 0016 02/19/23 0308  WBC 11.9* 11.9* 11.7* 10.6*  HGB 8.5* 8.1* 8.3* 8.2*  HCT 27.5* 25.6* 26.6* 26.2*  MCV 87.3 87.4 86.4 86.5  PLT 207 210 225 227    Medications:     allopurinol  100 mg Oral Daily   aspirin EC  81 mg Oral Daily   busPIRone  10 mg Oral TID   carvedilol  12.5 mg Oral BID WC   cephALEXin  250 mg Oral QHS  darbepoetin (ARANESP) injection - NON-DIALYSIS  150 mcg Subcutaneous Q Mon-1800   insulin aspart  0-6 Units Subcutaneous Q4H   insulin glargine-yfgn  34 Units Subcutaneous Daily   latanoprost  1 drop Both Eyes QHS   linaclotide  145 mcg Oral QAC breakfast   pantoprazole  40 mg Oral Daily   polyethylene glycol  17 g Oral Daily   ticagrelor  90 mg Oral BID   Cecille Aver  02/19/2023, 8:34 AM

## 2023-02-19 NOTE — Progress Notes (Signed)
   02/19/2023  Patient ID: Dana [name], female   DOB: 07/09/1961, 62 y.o.   MRN: [id number]  Patient outreach to reschedule today's telephone visit, as patient is currently hospitalized.  Moved appointment to Wednesday 8/21 at 11am.  Lenna Gilford, PharmD, DPLA

## 2023-02-19 NOTE — Progress Notes (Signed)
Mobility Specialist Progress Note:   02/19/23 1400  Mobility  Activity Ambulated with assistance in hallway  Level of Assistance Contact guard assist, steadying assist  Assistive Device Cane  Distance Ambulated (ft) 70 ft  Activity Response Tolerated well  Mobility Referral Yes  $Mobility charge 1 Mobility  Mobility Specialist Start Time (ACUTE ONLY) 1356  Mobility Specialist Stop Time (ACUTE ONLY) 1412  Mobility Specialist Time Calculation (min) (ACUTE ONLY) 16 min    Pre Mobility: 70 HR, 93% SpO2 During Mobility: 79 HR, 95% SpO2 Post Mobility:  75 HR, 94% SpO2  Pt received in chair, eager to mobility. Ambulated w/ single point cane this session. C/o of legs feeling a little shaky but mostly stable. Presented to be asymptomatic throughout with VSS. Pt left in chair with call bell and all needs met.  Dana [name] Mobility Specialist Please contact via Special educational needs teacher or Rehab office at [phone]

## 2023-02-19 NOTE — TOC Progression Note (Signed)
Transition of Care [facility]) - Progression Note    Patient Details  Name: VANEZZA CORR MRN: [id number] Date of Birth: Oct 26, 1960  Transition of Care [facility]) CM/SW Contact  Leone Haven, RN Phone Number: 02/19/2023, 1:21 PM  Clinical Narrative:    Per progression information, cret is better 2.9, they will give one more day.  TOC following.   Expected Discharge Plan: Home/Self Care Barriers to Discharge: Continued Medical Work up  Expected Discharge Plan and Services       Living arrangements for the past 2 months: Single Family Home                                       Social Determinants of Health (SDOH) Interventions SDOH Screenings   Food Insecurity: No Food Insecurity (02/13/2023)  Housing: Low Risk  (02/13/2023)  Transportation Needs: No Transportation Needs (02/13/2023)  Utilities: Not At Risk (02/13/2023)  Alcohol Screen: Low Risk  (07/25/2022)  Depression (PHQ2-9): Medium Risk (02/05/2023)  Financial Resource Strain: Medium Risk (10/30/2022)  Physical Activity: Inactive (02/05/2023)  Social Connections: Moderately Integrated (07/25/2022)  Stress: Stress Concern Present (02/05/2023)  Tobacco Use: Medium Risk (02/17/2023)  Health Literacy: Adequate Health Literacy (01/30/2023)    Readmission Risk Interventions    12/16/2022   11:54 AM 05/19/2021    1:11 PM 10/06/2020    2:45 PM  Readmission Risk Prevention Plan  Transportation Screening Complete Complete Complete  PCP or Specialist Appt within 3-5 Days   Complete  Social Work Consult for Recovery Care Planning/Counseling   Complete  Palliative Care Screening   Not Applicable  Medication Review [facility]) Referral to Pharmacy Complete Complete  PCP or Specialist appointment within 3-5 days of discharge Complete Complete   HRI or Home Care Consult Complete Complete   SW Recovery Care/Counseling Consult Complete Complete   Palliative Care Screening Not Applicable Not Applicable   Skilled Nursing  Facility Not Applicable Not Applicable

## 2023-02-19 NOTE — Progress Notes (Signed)
PROGRESS NOTE    RITIKA SCHMELZ  WGN:[id number] DOB: 1960-09-16 DOA: 02/12/2023 PCP: Blane Ohara, MD    Chief Complaint  Patient presents with   Chest Pain    Brief Narrative:  Patient is a 62 year old female history of anxiety, chronic low back pain, CKD 3B, OSA, chronic hypoxic respiratory failure, IDDM, complex cardiac history of multiple PCI procedures and complications including contrast-induced nephropathy presented to the ED with chest pain. -Patient noted to have substernal chest pressure with ambulation ongoing despite recent increase in her isosorbide.  Patient took 3 nitroglycerin prior to presentation with ongoing symptoms.  Patient seen in the ED noted to have sats in the mid 90s on 4 L nasal cannula, EKG showed normal sinus rhythm right axis deviation, chest x-ray with cardiomegaly and chronic interstitial thickening.  Troponin noted to be elevated at 107 increased to 941. -Patient seen in consultation by cardiology, patient placed on a heparin drip and nitroglycerin drips, and cardiology recommended cardiac catheterization 02/13/2023 for further evaluation.   Assessment & Plan:   Principal Problem:   NSTEMI (non-ST elevated myocardial infarction) (HCC) Active Problems:   Acute renal failure superimposed on stage 3b chronic kidney disease (HCC)   Chronic pain syndrome   Chronic heart failure with preserved ejection fraction (HFpEF) (HCC)   Chronic respiratory failure with hypoxia (HCC)   Chronic gout without tophus   Uncomplicated opioid dependence (HCC)   OSA on CPAP   Insulin dependent type 2 diabetes mellitus (HCC)   CKD stage 3b, GFR 30-44 ml/min (HCC)   GAD (generalized anxiety disorder)  #1 Non-STEMI -Patient presented with chest pain on exertion with no improvement despite recent increase in isosrbide, as well as nitroglycerin. -Patient with some symptomatic improvement on IV heparin and IV nitroglycerin drip. -Due to patient's complex cardiac history  cardiology consulted who assessed the patient and recommended cardiac catheterization, 02/13/2023. -Cardiac catheterization done showed severe restenosis in the proximal LAD stent status post successful IVUS guided PTCA/DES x 1 proximal LAD.  Chronic occlusion mid circumflex beyond stented segment.  Patent obtuse marginal branch with severe distal stenosis which was too small for PCI.  Dominant RCA with moderate proximal stenosis, patent distal stent noted. -Clinical improvement postcardiac catheterization. -Cardiology recommended dual antiplatelet therapy with aspirin and Brilinta.  Continue Coreg. -IV nitroglycerin drip, IV heparin discontinued per cardiology. -Patient started back on home regimen torsemide on 02/14/2023 however due to worsening renal function torsemide has been discontinued.   -Cardiology was following but have signed off as of 02/15/2023.   -Will need close outpatient follow-up with cardiology on discharge.   2.  Dyspnea -, Likely secondary to IV fluids patient was placed on with gentle hydration in addition to holding her diuretics. -Dyspnea improved after Lasix 40 mg IV x 1 given on 02/13/2023.   -Patient status post cardiac catheterization, 02/13/2023.  -Patient denies any significant shortness of breath at this time.  3.  Insulin-dependent diabetes mellitus 1 -Hemoglobin A1c 8.5 (02/13/2023). -CBG 92 this morning. -Patient noted to be on sliding scale NovoLog, Levemir 55 units daily prior to admission. -Continue Semglee 34 units daily, SSI.   4.  Acute kidney injury on CKD stage IIIb -Patient noted to have worsening renal function with creatinine currently at 2.94 from 4.31 from 5.19 from 4.81 from 3.35 from 2.09 from 1.88 from 1.95 on admission.  -Baseline creatinine approximately 1.7-2. -Renal function was trending up however seems to be trending back down.  -Urine output of 1.7 L over the past 24  hours. -Patient noted to have multiple contrast-induced nephropathy post  cath in the past. -Patient was placed on gentle hydration and torsemide held in anticipation of cardiac catheterization on presentation. -Patient with dyspnea on exertion the morning of 02/13/2023, IV fluids discontinued and patient given a dose of IV Lasix with good urine output.   -Post cath creatinine now at 2.94 from 4.31 from.19 from 4.81 from 3.35 from 2.09 from 1.88.  -IV Lasix discontinued and patient was resumed on home regimen torsemide on 02/14/2023.   -Due to worsening renal function torsemide was discontinued on 02/15/2023.   -Urinalysis done with small leukocytes, nitrite negative, protein negative, 6-10 WBCs.  Urine sodium of 27, urine creatinine of 97.  -Renal ultrasound done was negative for hydronephrosis.  -Nephrology consulted, patient seen in consultation by Dr. Signe Colt and due to worsening renal function Lasix challenge ordered and patient received Lasix 80 mg IV x 1 on 02/16/2023 with a urine output of 1.1 L, however patient still with worsening renal function on 02/17/2023 and as such nephrology had recommended placement of hemodialysis catheter and initiation of hemodialysis on 02/18/2023.  -Renal function however seems to be trending back down as of 02/18/2023, patient still with good urine output, nephrology has reassessed the patient and they are holding off hemodialysis at this time and monitoring.  -Per nephrology if patient with continued improvement in renal function in the next 24 hours they will feel comfortable at that time for possible discharge. -Nephrology following and appreciate their input and recommendations.  5.  General anxiety disorder -BuSpar, Ativan.   6.  Chronic back pain -Continue Norco.  7.  OSA/chronic hypoxic respiratory failure -CPAP nightly, supplemental O2.  8.  Chronic HFpEF -Compensated on presentation. -Torsemide held and patient started on gentle hydration in anticipation of cardiac catheterization on 02/13/2023, however due to dyspnea on exertion,  concern for volume overload IV fluids were discontinued and patient received a dose of Lasix 40 mg IV x 1 with a urine output of 1.550 L.   -Home regimen torsemide resumed 02/14/2023 per cardiology recommendations however due to worsening renal function, torsemide was discontinued.  -Nephrology to advise when and if torsemide may be resumed. -Per cardiology.    9.  Moderate MR status post transcatheter edge-to-edge repair of mitral valve -Stable. -Per cardiology.  10.  Gout -Continue allopurinol.    11.  Chronic leukocytosis -Patient with a chronic leukocytosis, WBC at 10.6, currently stable.  -Afebrile, no signs or symptoms of infection.   -No need for antibiotics at this time.  -Follow.   DVT prophylaxis: Heparin drip Code Status: Full Family Communication: Updated patient, no family at bedside. Disposition: Home once renal function improves and patient cleared by nephrology hopefully in the next 24 hours.  Status is: Inpatient Remains inpatient appropriate because: Severity of illness   Consultants:  Cardiology: Dr. Excell Seltzer 02/12/2023 Nephrology: Dr. Signe Colt 02/15/2023  Procedures:  Chest x-ray 02/12/2023, 02/15/2023 Cardiac catheterization 02/13/2023-per Dr. Clifton James Renal ultrasound 02/15/2023   Antimicrobials:  Anti-infectives (From admission, onward)    Start     Dose/Rate Route Frequency Ordered Stop   02/18/23 0800  ceFAZolin (ANCEF) IVPB 2g/100 mL premix       Note to Pharmacy: TO IR   2 g 200 mL/hr over 30 Minutes Intravenous To Radiology 02/18/23 0454 02/19/23 0800   02/12/23 2200  cephALEXin (KEFLEX) capsule 250 mg        250 mg Oral Daily at bedtime 02/12/23 1941  Subjective: Laying in bed watching television.  Denies any significant shortness of breath.  No chest pain.  No abdominal pain.  Denies any palpitations.  Overall feels well.  States has good urine output.   States nephrologist states if renal function continues to improve may be able to be  discharged home tomorrow.  Objective: Vitals:   02/18/23 2326 02/19/23 0507 02/19/23 0746 02/19/23 0811  BP: (!) 119/43 (!) 141/56  (!) 144/57  Pulse: 65 67  74  Resp: 14 12    Temp: 97.8 F (36.6 C) 97.9 F (36.6 C) 98.9 F (37.2 C)   TempSrc: Oral Oral Oral   SpO2: 99% 95%    Weight:  102.9 kg    Height:        Intake/Output Summary (Last 24 hours) at 02/19/2023 1123 Last data filed at 02/19/2023 0524 Gross per 24 hour  Intake --  Output 1700 ml  Net -1700 ml   Filed Weights   02/17/23 0359 02/18/23 0348 02/19/23 0507  Weight: 103.4 kg 102.4 kg 102.9 kg    Examination:  General exam: NAD. Respiratory system: Right basilar crackles.  No wheezes, fair air movement, speaking in full sentences.  Cardiovascular system: Regular rate rhythm no murmurs rubs or gallops.  Trace bilateral lower extremity edema.  Gastrointestinal system: Abdomen is soft, nontender, nondistended, positive bowel sounds.  No rebound.  No guarding.   Central nervous system: Alert and oriented.  Moving extremities spontaneously.  No focal neurological deficits. Extremities: Trace bilateral lower extremity edema.  Symmetric 5 x 5 power. Skin: No rashes, lesions or ulcers Psychiatry: Judgement and insight appear normal. Mood & affect appropriate.     Data Reviewed: I have personally reviewed following labs and imaging studies  CBC: Recent Labs  Lab 02/15/23 0037 02/16/23 0018 02/17/23 0043 02/18/23 0016 02/19/23 0308  WBC 12.8* 11.9* 11.9* 11.7* 10.6*  HGB 9.1* 8.5* 8.1* 8.3* 8.2*  HCT 29.1* 27.5* 25.6* 26.6* 26.2*  MCV 86.4 87.3 87.4 86.4 86.5  PLT 206 207 210 225 227    Basic Metabolic Panel: Recent Labs  Lab 02/13/23 0202 02/14/23 0247 02/15/23 0037 02/16/23 0018 02/17/23 0043 02/18/23 0016 02/19/23 0308  NA 136   < > 134* 136 135 134* 138  K 3.7   < > 4.2 4.2 4.3 4.3 4.3  CL 100   < > 100 101 102 100 104  CO2 24   < > 22 24 23 23 24   GLUCOSE 338*   < > 219* 108* 203* 249* 125*   BUN 56*   < > 60* 71* 76* 70* 56*  CREATININE 1.88*   < > 3.35* 4.81* 5.19* 4.31* 2.94*  CALCIUM 8.7*   < > 8.7* 8.4* 7.9* 8.1* 8.4*  MG 1.8  --   --   --  2.0  --   --   PHOS  --   --   --  6.0* 5.8* 5.0* 4.5   < > = values in this interval not displayed.    GFR: Estimated Creatinine Clearance: 23.8 mL/min (A) (by C-G formula based on SCr of 2.94 mg/dL (H)).  Liver Function Tests: Recent Labs  Lab 02/16/23 0018 02/17/23 0043 02/18/23 0016 02/19/23 0308  ALBUMIN 2.7* 2.7* 2.7* 2.8*    CBG: Recent Labs  Lab 02/18/23 2004 02/18/23 2328 02/19/23 0459 02/19/23 0801 02/19/23 1121  GLUCAP 226* 210* 107* 92 110*     Recent Results (from the past 240 hour(s))  MRSA Next Gen by PCR, Nasal  Status: None   Collection Time: 02/12/23 11:37 PM   Specimen: Nasal Mucosa; Nasal Swab  Result Value Ref Range Status   MRSA by PCR Next Gen NOT DETECTED NOT DETECTED Final    Comment: (NOTE) The GeneXpert MRSA Assay (FDA approved for NASAL specimens only), is one component of a comprehensive MRSA colonization surveillance program. It is not intended to diagnose MRSA infection nor to guide or monitor treatment for MRSA infections. Test performance is not FDA approved in patients less than 65 years old. Performed at [facility], [address], [place], [place] [address]          Radiology Studies: No results found.      Scheduled Meds:  allopurinol  100 mg Oral Daily   aspirin EC  81 mg Oral Daily   busPIRone  10 mg Oral TID   carvedilol  12.5 mg Oral BID WC   cephALEXin  250 mg Oral QHS   darbepoetin (ARANESP) injection - NON-DIALYSIS  150 mcg Subcutaneous Q Mon-1800   insulin aspart  0-6 Units Subcutaneous Q4H   insulin glargine-yfgn  34 Units Subcutaneous Daily   latanoprost  1 drop Both Eyes QHS   linaclotide  145 mcg Oral QAC breakfast   pantoprazole  40 mg Oral Daily   polyethylene glycol  17 g Oral Daily   ticagrelor  90 mg Oral BID   Continuous  Infusions:  sodium chloride     ferric gluconate (FERRLECIT) IVPB 250 mg (02/19/23 1052)   nitroGLYCERIN Stopped (02/13/23 2307)     LOS: 7 days    Time spent: 35 minutes    Ramiro Harvest, MD [facility]   To contact the attending provider between 7A-7P or the covering provider during after hours 7P-7A, please log into the web site [email or web address] and access using universal [place] password for that web site. If you do not have the password, please call the hospital operator.  02/19/2023, 11:23 AM

## 2023-02-19 NOTE — Plan of Care (Signed)
  Problem: Education: Goal: Understanding of CV disease, CV risk reduction, and recovery process will improve Outcome: Progressing Goal: Individualized Educational Video(s) Outcome: Progressing   Problem: Activity: Goal: Ability to return to baseline activity level will improve Outcome: Progressing   Problem: Cardiovascular: Goal: Ability to achieve and maintain adequate cardiovascular perfusion will improve Outcome: Progressing Goal: Vascular access site(s) Level 0-1 will be maintained Outcome: Progressing   Problem: Health Behavior/Discharge Planning: Goal: Ability to safely manage health-related needs after discharge will improve Outcome: Progressing   Problem: Education: Goal: Ability to describe self-care measures that may prevent or decrease complications (Diabetes Survival Skills Education) will improve Outcome: Progressing Goal: Individualized Educational Video(s) Outcome: Progressing   Problem: Coping: Goal: Ability to adjust to condition or change in health will improve Outcome: Progressing   Problem: Fluid Volume: Goal: Ability to maintain a balanced intake and output will improve Outcome: Progressing   Problem: Health Behavior/Discharge Planning: Goal: Ability to identify and utilize available resources and services will improve Outcome: Progressing Goal: Ability to manage health-related needs will improve Outcome: Progressing   Problem: Metabolic: Goal: Ability to maintain appropriate glucose levels will improve Outcome: Progressing   Problem: Nutritional: Goal: Maintenance of adequate nutrition will improve Outcome: Progressing Goal: Progress toward achieving an optimal weight will improve Outcome: Progressing   Problem: Skin Integrity: Goal: Risk for impaired skin integrity will decrease Outcome: Progressing   Problem: Tissue Perfusion: Goal: Adequacy of tissue perfusion will improve Outcome: Progressing   Problem: Education: Goal:  Understanding of cardiac disease, CV risk reduction, and recovery process will improve Outcome: Progressing Goal: Individualized Educational Video(s) Outcome: Progressing   Problem: Activity: Goal: Ability to tolerate increased activity will improve Outcome: Progressing   Problem: Cardiac: Goal: Ability to achieve and maintain adequate cardiovascular perfusion will improve Outcome: Progressing   Problem: Health Behavior/Discharge Planning: Goal: Ability to safely manage health-related needs after discharge will improve Outcome: Progressing   Problem: Education: Goal: Knowledge of General Education information will improve Description: Including pain rating scale, medication(s)/side effects and non-pharmacologic comfort measures Outcome: Progressing   Problem: Health Behavior/Discharge Planning: Goal: Ability to manage health-related needs will improve Outcome: Progressing   Problem: Clinical Measurements: Goal: Ability to maintain clinical measurements within normal limits will improve Outcome: Progressing Goal: Will remain free from infection Outcome: Progressing Goal: Diagnostic test results will improve Outcome: Progressing Goal: Respiratory complications will improve Outcome: Progressing Goal: Cardiovascular complication will be avoided Outcome: Progressing   Problem: Activity: Goal: Risk for activity intolerance will decrease Outcome: Progressing   Problem: Nutrition: Goal: Adequate nutrition will be maintained Outcome: Progressing   Problem: Coping: Goal: Level of anxiety will decrease Outcome: Progressing   Problem: Elimination: Goal: Will not experience complications related to bowel motility Outcome: Progressing Goal: Will not experience complications related to urinary retention Outcome: Progressing   Problem: Pain Managment: Goal: General experience of comfort will improve Outcome: Progressing   Problem: Safety: Goal: Ability to remain free from  injury will improve Outcome: Progressing   Problem: Skin Integrity: Goal: Risk for impaired skin integrity will decrease Outcome: Progressing

## 2023-02-20 DIAGNOSIS — T508X5A Adverse effect of diagnostic agents, initial encounter: Secondary | ICD-10-CM | POA: Insufficient documentation

## 2023-02-20 DIAGNOSIS — I214 Non-ST elevation (NSTEMI) myocardial infarction: Secondary | ICD-10-CM | POA: Diagnosis not present

## 2023-02-20 LAB — GLUCOSE, CAPILLARY
Glucose-Capillary: 78 mg/dL (ref 70–99)
Glucose-Capillary: 108 mg/dL — ABNORMAL HIGH (ref 70–99)
Glucose-Capillary: 114 mg/dL — ABNORMAL HIGH (ref 70–99)

## 2023-02-20 MED ORDER — TORSEMIDE 40 MG PO TABS: 40.0000 mg | ORAL_TABLET | Freq: Every day | ORAL | 0 refills | Status: DC

## 2023-02-20 NOTE — Consult Note (Signed)
   [facility] Inpatient Consult   02/20/2023  Dana [name] [birth year] [id number]  [facility] [[facility]]  Accountable Care Organization [ACO] Patient: [name]  Primary Care Provider:  Blane Ohara, MD   Patient is currently active with [facility] [[facility]] Care Management for chronic disease management services.  Patient has been engaged by a [employer].  Our community based plan of care has focused on disease management and community resource support.    Patient will receive a post hospital call and will be evaluated for assessments and disease process education.    Plan: Will notified [facility] Coordinator of disposition.  Of note, [facility] Care Management services does not replace or interfere with any services that are needed or arranged by inpatient [facility] care management team.   For additional questions or referrals please contact:  Dana Shanks, RN BSN CCM [facility]  [phone] business mobile phone Toll free office [phone]  *Concierge Line  [phone] Fax number: [phone] [email or web address]

## 2023-02-20 NOTE — Progress Notes (Signed)
[facility] Progress Note   Assessment/ Plan:    AKI on CKD 3b:  baseline crt 1.7.  Has also had AKI in the recent past-  June-  did not require dialysis at that time - in the setting of NSTEMI and cath -  making urine-- had dropped off but then better last 72 hours also with pretty significant improvement in crt from 5.2 to 4.3, now 2.9.  Plan had been to proceed with [facility] and HD but given the events I think we can continue to hold off-  suspect will not be needed at all this hosp -  continue to follow closely -  she already had follow up with me in September. I would be comfortable with discharge-     2.  NSTEMI:             - s/p severe restenosis of prox LAD lesion  - on Brilinta   3.  DM I             - per primary   4.  HTN/vol-  is autodiuresing so far but will need to resume torsemide at discharge -  would say 40 mg daily -  she is monitored closely by cards   5. Anemia-  some due to CKD-  also procedure-   iron stores low-  will replete-  can get dose today and tomorrow   and added ESA -  will follow up as OP      Subjective:    Cr better again today as is UOP -  at least 800   Objective:   BP (!) 113/38 (BP Location: Left Arm)   Pulse 62   Temp 97.6 F (36.4 C) (Oral)   Resp 12   Ht 5\' 4"  (1.626 m)   Wt 102.9 kg   SpO2 98%   BMI 38.94 kg/m   Intake/Output Summary (Last 24 hours) at 02/20/2023 0701 Last data filed at 02/19/2023 2000 Gross per 24 hour  Intake 751.91 ml  Output 800 ml  Net -48.09 ml   Weight change: 0 kg  Physical Exam: GEN NAD, sitting in bed HEENT EOMI PERRL NECK no JVD PULM normal WOB, wearing O2, some basilar crackles on R, not so much on L CV RRR ABD: soft EXT trace- 1+  LE edema NEURO  AAO x 3   Imaging: No results found.  Labs: BMET Recent Labs  Lab 02/14/23 0247 02/15/23 0037 02/16/23 0018 02/17/23 0043 02/18/23 0016 02/19/23 0308 02/20/23 0252  NA 135 134* 136 135 134* 138 139  K 3.3* 4.2 4.2 4.3 4.3  4.3 4.4  CL 100 100 101 102 100 104 104  CO2 22 22 24 23 23 24 22   GLUCOSE 235* 219* 108* 203* 249* 125* 104*  BUN 53* 60* 71* 76* 70* 56* 45*  CREATININE 2.09* 3.35* 4.81* 5.19* 4.31* 2.94* 2.54*  CALCIUM 8.3* 8.7* 8.4* 7.9* 8.1* 8.4* 8.5*  PHOS  --   --  6.0* 5.8* 5.0* 4.5 3.9   CBC Recent Labs  Lab 02/17/23 0043 02/18/23 0016 02/19/23 0308 02/20/23 0252  WBC 11.9* 11.7* 10.6* 9.3  HGB 8.1* 8.3* 8.2* 8.2*  HCT 25.6* 26.6* 26.2* 26.7*  MCV 87.4 86.4 86.5 87.5  PLT 210 225 227 213    Medications:     allopurinol  100 mg Oral Daily   aspirin EC  81 mg Oral Daily   busPIRone  10 mg Oral TID   carvedilol  12.5 mg Oral  BID WC   cephALEXin  250 mg Oral QHS   darbepoetin (ARANESP) injection - NON-DIALYSIS  150 mcg Subcutaneous Q Mon-1800   insulin aspart  0-6 Units Subcutaneous Q4H   insulin glargine-yfgn  34 Units Subcutaneous Daily   latanoprost  1 drop Both Eyes QHS   linaclotide  145 mcg Oral QAC breakfast   pantoprazole  40 mg Oral Daily   polyethylene glycol  17 g Oral Daily   ticagrelor  90 mg Oral BID    A   02/20/2023, 7:01 AM

## 2023-02-20 NOTE — Discharge Summary (Signed)
Physician Discharge Summary  [name] HYQ:[id number] DOB: [birth year] DOA: 02/12/2023  PCP: Blane Ohara, MD  Admit date: 02/12/2023 Discharge date: 02/20/2023  Admitted From: Home Disposition:  Home  Recommendations for Outpatient Follow-up:  Follow up with PCP in 1 week Follow up with Dr. Kathrene Bongo in September  Follow-up with cardiology  Discharge Condition: Stable CODE STATUS: Full code Diet recommendation: Heart healthy diet  Brief/Interim Summary: Dana [name] is a 62 year old female history of anxiety, chronic low back pain, CKD 3B, OSA, chronic hypoxic respiratory failure, IDDM, complex cardiac history of multiple PCI procedures and complications including contrast-induced nephropathy presented to the ED with chest pain. Patient noted to have substernal chest pressure with ambulation ongoing despite recent increase in her isosorbide.  Patient took 3 nitroglycerin prior to presentation with ongoing symptoms.    Patient seen in the ED noted to have sats in the mid 90s on 4 L nasal cannula, EKG showed normal sinus rhythm right axis deviation, chest x-ray with cardiomegaly and chronic interstitial thickening.  Troponin noted to be elevated at 107 increased to 941.   Patient seen in consultation by cardiology, patient placed on a heparin drip and nitroglycerin drips, and cardiology recommended cardiac catheterization 02/13/2023 for further evaluation. Cardiac catheterization done showed severe restenosis in the proximal LAD stent status post successful IVUS guided PTCA/DES x 1 proximal LAD. Chronic occlusion mid circumflex beyond stented segment. Patent obtuse marginal branch with severe distal stenosis which was too small for PCI. Dominant RCA with moderate proximal stenosis, patent distal stent noted.   Hospitalization was further complicated by worsening renal function in setting of heart cath and contrast-induced nephropathy.  Nephrology consulted.  IV Lasix was discontinued and  torsemide was also discontinued.  Patient had eventual renal recovery with slow downward trend of creatinine.  She had good urine output and patient was discharged home in stable condition.   Discharge Diagnoses:   Principal Problem:   NSTEMI (non-ST elevated myocardial infarction) (HCC) Active Problems:   Acute renal failure superimposed on stage 3b chronic kidney disease (HCC)   CAD (coronary artery disease)   Chronic pain syndrome   Chronic heart failure with preserved ejection fraction (HFpEF) (HCC)   Chronic respiratory failure with hypoxia (HCC)   Chronic gout without tophus   Uncomplicated opioid dependence (HCC)   OSA on CPAP   Insulin dependent type 2 diabetes mellitus (HCC)   CKD stage 3b, GFR 30-44 ml/min (HCC)   GAD (generalized anxiety disorder)   Contrast-induced nephropathy   Discharge Instructions  Discharge Instructions     Amb Referral to Cardiac Rehabilitation   Complete by: As directed    Diagnosis:  Coronary Stents NSTEMI     After initial evaluation and assessments completed: Virtual Based Care may be provided alone or in conjunction with Phase 2 Cardiac Rehab based on patient barriers.: Yes   Intensive Cardiac Rehabilitation (ICR) [facility] location only OR Traditional Cardiac Rehabilitation (TCR) *If criteria for ICR are not met will enroll in TCR [facility] only): Yes   Call MD for:  difficulty breathing, headache or visual disturbances   Complete by: As directed    Call MD for:  extreme fatigue   Complete by: As directed    Call MD for:  persistant dizziness or light-headedness   Complete by: As directed    Call MD for:  persistant nausea and vomiting   Complete by: As directed    Call MD for:  severe uncontrolled pain   Complete by: As directed  Call MD for:  temperature >100.4   Complete by: As directed    Diet Carb Modified   Complete by: As directed    Discharge instructions   Complete by: As directed    You were cared for by a hospitalist during  your hospital stay. If you have any questions about your discharge medications or the care you received while you were in the hospital after you are discharged, you can call the unit and ask to speak with the hospitalist on call if the hospitalist that took care of you is not available. Once you are discharged, your primary care physician will handle any further medical issues. Please note that NO REFILLS for any discharge medications will be authorized once you are discharged, as it is imperative that you return to your primary care physician (or establish a relationship with a primary care physician if you do not have one) for your aftercare needs so that they can reassess your need for medications and monitor your lab values.   Increase activity slowly   Complete by: As directed       Allergies as of 02/20/2023       Reactions   Naproxen Hives, Rash   Shellfish-derived Products Hives, Swelling, Other (See Comments)   Angioedema   Strawberry (diagnostic) Hives   Celecoxib Other (See Comments)   Stomach pain   Glucosamine Hives, Swelling, Other (See Comments)   Angioedema   Iron    IV iron transfusion - hives - Feb 2022 hospitalization   Metoclopramide Other (See Comments)   Facial twitching and stuttering   Metolazone Other (See Comments)   Acute renal failure   Statins Other (See Comments)   Muscle pain        Medication List     STOP taking these medications    isosorbide mononitrate 60 MG 24 hr tablet Commonly known as: IMDUR       TAKE these medications    allopurinol 300 MG tablet Commonly known as: ZYLOPRIM TAKE ONE TABLET BY MOUTH ONCE DAILY   aspirin EC 81 MG tablet Take 1 tablet (81 mg total) by mouth daily. Swallow whole.   BD Pen Needle Nano 2nd Gen 32G X 4 MM Misc Generic drug: Insulin Pen Needle Inject 1 each into the skin in the morning, at noon, in the evening, and at bedtime.   busPIRone 10 MG tablet Commonly known as: BUSPAR Take 1 tablet (10 mg  total) by mouth 3 (three) times daily.   carvedilol 12.5 MG tablet Commonly known as: COREG TAKE ONE TABLET BY MOUTH twice daily   cephALEXin 250 MG capsule Commonly known as: KEFLEX Take 250 mg by mouth at bedtime.   CRANBERRY PO Take 2 tablets by mouth daily.   EPINEPHrine 0.3 mg/0.3 mL Soaj injection Commonly known as: EPI-PEN Use as directed for life-threatening allergic reaction.   ezetimibe 10 MG tablet Commonly known as: ZETIA Take 1 tablet (10 mg total) by mouth daily. What changed: when to take this   FISH OIL PO Take 1 capsule by mouth daily.   FreeStyle Libre 2 Sensor Misc USE TO check blood glucose AS DIRECTED AND CHANGE sensor every 14 DAYS   HYDROcodone-acetaminophen 7.5-325 MG tablet Commonly known as: NORCO Take 1 tablet by mouth 3 (three) times daily as needed.   latanoprost 0.005 % ophthalmic solution Commonly known as: XALATAN Place 1 drop into both eyes at bedtime.   Levemir FlexPen 100 UNIT/ML FlexPen Generic drug: insulin detemir Inject 50 units  into THE SKIN daily What changed: See the new instructions.   Linzess 145 MCG Caps capsule Generic drug: linaclotide TAKE ONE CAPSULE BY MOUTH BEFORE BREAKFAST   LORazepam 0.5 MG tablet Commonly known as: ATIVAN TAKE ONE TABLET BY MOUTH ONCE daily AS NEEDED FOR ANXIETY   magnesium oxide 400 MG tablet Commonly known as: MAG-OX Take 1 tablet (400 mg total) by mouth daily. Take 2 (400 mg) daily=800 mg What changed: how much to take   nitroGLYCERIN 0.4 MG SL tablet Commonly known as: NITROSTAT DISSOLVE ONE TABLET UNDER THE TONGUE EVERY 5 MINUTES AS NEEDED FOR CHEST PAIN.  DO NOT EXCEED A TOTAL OF 3 DOSES IN 15 MINUTES   NovoLOG FlexPen 100 UNIT/ML FlexPen Generic drug: insulin aspart 2-10 U before meals based on SSI.   OXYGEN Inhale 2 L into the lungs at bedtime.   pantoprazole 40 MG tablet Commonly known as: PROTONIX TAKE ONE TABLET BY MOUTH ONCE DAILY   polyethylene glycol 17 g  packet Commonly known as: MIRALAX / GLYCOLAX Take 17 g by mouth daily.   potassium chloride SA 20 MEQ tablet Commonly known as: KLOR-CON M TAKE ONE TABLET BY MOUTH ONCE DAILY What changed: when to take this   Probiotic Caps Take 1 capsule by mouth daily.   promethazine 25 MG tablet Commonly known as: PHENERGAN Take 1 tablet (25 mg total) by mouth daily as needed. What changed: reasons to take this   Repatha SureClick 140 MG/ML Soaj Generic drug: Evolocumab inject 140mg  intot HEART SKIN every 14 DAYS   ticagrelor 90 MG Tabs tablet Commonly known as: BRILINTA Take 1 tablet (90 mg total) by mouth 2 (two) times daily.   Torsemide 40 MG Tabs Take 40 mg by mouth daily. What changed:  medication strength See the new instructions.        Follow-up Information     [facility] Follow up.   Specialty: Cardiology Why: keep your follow up with Dr. Gala Romney in September, Heart failure scheduler will contact you to arrange an earlier visit with one of the heart failure APP after discharge Contact information: [address] [place] [address] [phone]               Allergies  Allergen Reactions   Naproxen Hives and Rash   Shellfish-Derived Products Hives, Swelling and Other (See Comments)    Angioedema    Strawberry (Diagnostic) Hives   Celecoxib Other (See Comments)    Stomach pain   Glucosamine Hives, Swelling and Other (See Comments)    Angioedema    Iron     IV iron transfusion - hives - Feb 2022 hospitalization   Metoclopramide Other (See Comments)    Facial twitching and stuttering   Metolazone Other (See Comments)    Acute renal failure   Statins Other (See Comments)    Muscle pain     Procedures/Studies: DG Chest 2 View  Result Date: 02/15/2023 CLINICAL DATA:  Shortness of breath EXAM: CHEST - 2 VIEW COMPARISON:  02/12/2023 and older FINDINGS: Enlarged cardiopericardial  silhouette. Mitral valve clips and pulmonary arterial device in place. Vascular congestion. Question trace edema. No pneumothorax or effusion. Overlapping cardiac leads. Possible coronary stent. IMPRESSION: Enlarged heart with vascular congestion question trace edema. Electronically Signed   By: Karen Kays M.D.   On: 02/15/2023 12:18   US RENAL  Result Date: 02/15/2023 CLINICAL DATA:  Acute renal failure EXAM: RENAL / URINARY TRACT ULTRASOUND COMPLETE COMPARISON:  None Available. FINDINGS: Right Kidney: Renal measurements: 10.4 x 4.4 x 8.7 cm = volume: 112.6 mL. Echogenicity within normal limits. No mass or hydronephrosis visualized. There is an echogenic shadowing areas centrally measuring 6 mm. A small stone is possible. Left Kidney: Renal measurements: 11.9 x 4.8 x 5.1 cm = volume: 151.6 mL. Echogenicity within normal limits. No mass or hydronephrosis visualized. Bladder: Appears normal for degree of bladder distention. Other: None. IMPRESSION: No collecting system dilatation. Question small right-sided renal stone. Electronically Signed   By: Karen Kays M.D.   On: 02/15/2023 12:17   CARDIAC CATHETERIZATION  Result Date: 02/13/2023   Mid LAD to Dist LAD lesion is 60% stenosed.   Mid Cx to Dist Cx lesion is 100% stenosed.   1st Mrg-1 lesion is 30% stenosed.   1st Mrg-2 lesion is 90% stenosed.   Prox LAD to Mid LAD lesion is 95% stenosed.   Prox RCA lesion is 70% stenosed.   Non-stenotic Mid Cx lesion was previously treated.   Non-stenotic Dist RCA lesion was previously treated.   A drug-eluting stent was successfully placed using a STENT ONYX FRONTIER 2.75X22.   Post intervention, there is a 0% residual stenosis. Severe restenosis in the proximal LAD stent. The distal LAD is small in caliber. Successful IVUS guided PTCA/DES x 1 proximal LAD (Onyx stent platform placed over older Synergy stent) Chronic occlusion mid Circumflex beyond the stented segment. Patent obtuse marginal branch with severe distal  stenosis (too small for PCI) Dominant RCA with moderate proximal stenosis, patent distal stent. LVEDP=19 mmHg Recommendations: Continue DAPT with ASA and Brilinta.   DG Chest 2 View  Result Date: 02/12/2023 CLINICAL DATA:  Chest pain, shortness of breath, and pain radiating down left arm. History of CHF. EXAM: CHEST - 2 VIEW COMPARISON:  Chest radiographs 12/08/2022, 12/06/2022, 09/07/2022, 05/16/2021, 10/03/2020 FINDINGS: Cardiac silhouette is again moderately markedly enlarged. Mediastinal contours within normal limits. Mild-to-moderate bilateral interstitial thickening, greatest within the inferior lungs is not significantly changed from 05/16/2021. This is improved from most recent 12/08/2022 radiograph. Mitral valve clips and CardioMEMS left pulmonary arterial device are unchanged in position. The posterior costophrenic angles are sharp on lateral view and no definite pleural effusion is seen. There is blunting of the bilateral costophrenic angles on frontal view likely due to chronic interstitial opacification. No pneumothorax is seen. Mild dextrocurvature of the mid to upper thoracic spine with moderate multilevel disc space narrowing. IMPRESSION: 1. Moderate cardiomegaly. 2. Mild-to-moderate bilateral interstitial thickening, greatest within the inferior lungs is not significantly changed from 05/16/2021. This is improved from most recent 12/08/2022 radiograph. This may represent the patient's normal baseline and some interstitial thickening/scarring. It may represent minimal interstitial pulmonary edema, although this would be the least interstitial pulmonary edema seen on numerous comparison radiographs. Electronically Signed   By: Neita Garnet M.D.   On: 02/12/2023 17:13       Discharge Exam: Vitals:   02/20/23 0403 02/20/23 0755  BP: (!) 113/38 (!) 122/41  Pulse: 62   Resp: 12   Temp: 97.6 F (36.4 C) 97.9 F (36.6 C)  SpO2: 98%     General: Pt is alert, awake, not in acute  distress Cardiovascular: RRR, S1/S2 +, no edema Respiratory: CTA bilaterally, no wheezing, no rhonchi, no respiratory distress, no conversational dyspnea  Abdominal: Soft, NT, ND, bowel sounds + Extremities: no edema, no cyanosis Psych: Normal mood and affect, stable judgement and insight     The results of significant diagnostics from this hospitalization (including  imaging, microbiology, ancillary and laboratory) are listed below for reference.     Microbiology: Recent Results (from the past 240 hour(s))  MRSA Next Gen by PCR, Nasal     Status: None   Collection Time: 02/12/23 11:37 PM   Specimen: Nasal Mucosa; Nasal Swab  Result Value Ref Range Status   MRSA by PCR Next Gen NOT DETECTED NOT DETECTED Final    Comment: (NOTE) The GeneXpert MRSA Assay (FDA approved for NASAL specimens only), is one component of a comprehensive MRSA colonization surveillance program. It is not intended to diagnose MRSA infection nor to guide or monitor treatment for MRSA infections. Test performance is not FDA approved in patients less than 51 years old. Performed at [facility], [address] [place], [place] [address]      Labs: BNP (last 3 results) Recent Labs    09/07/22 1013 12/06/22 1814 12/08/22 0159  BNP 168.7* 143.5* 149.2*   Basic Metabolic Panel: Recent Labs  Lab 02/16/23 0018 02/17/23 0043 02/18/23 0016 02/19/23 0308 02/20/23 0252  NA 136 135 134* 138 139  K 4.2 4.3 4.3 4.3 4.4  CL 101 102 100 104 104  CO2 24 23 23 24 22   GLUCOSE 108* 203* 249* 125* 104*  BUN 71* 76* 70* 56* 45*  CREATININE 4.81* 5.19* 4.31* 2.94* 2.54*  CALCIUM 8.4* 7.9* 8.1* 8.4* 8.5*  MG  --  2.0  --   --   --   PHOS 6.0* 5.8* 5.0* 4.5 3.9   Liver Function Tests: Recent Labs  Lab 02/16/23 0018 02/17/23 0043 02/18/23 0016 02/19/23 0308 02/20/23 0252  ALBUMIN 2.7* 2.7* 2.7* 2.8* 2.8*   No results for input(s): "LIPASE", "AMYLASE" in the last 168 hours. No results for input(s):  "AMMONIA" in the last 168 hours. CBC: Recent Labs  Lab 02/16/23 0018 02/17/23 0043 02/18/23 0016 02/19/23 0308 02/20/23 0252  WBC 11.9* 11.9* 11.7* 10.6* 9.3  HGB 8.5* 8.1* 8.3* 8.2* 8.2*  HCT 27.5* 25.6* 26.6* 26.2* 26.7*  MCV 87.3 87.4 86.4 86.5 87.5  PLT 207 210 225 227 213   Cardiac Enzymes: No results for input(s): "CKTOTAL", "CKMB", "CKMBINDEX", "TROPONINI" in the last 168 hours. BNP: Invalid input(s): "POCBNP" CBG: Recent Labs  Lab 02/19/23 1935 02/19/23 2337 02/20/23 0404 02/20/23 0756 02/20/23 1058  GLUCAP 184* 172* 78 108* 114*   D-Dimer No results for input(s): "DDIMER" in the last 72 hours. Hgb A1c No results for input(s): "HGBA1C" in the last 72 hours. Lipid Profile No results for input(s): "CHOL", "HDL", "LDLCALC", "TRIG", "CHOLHDL", "LDLDIRECT" in the last 72 hours. Thyroid function studies No results for input(s): "TSH", "T4TOTAL", "T3FREE", "THYROIDAB" in the last 72 hours.  Invalid input(s): "FREET3" Anemia work up Recent Labs    02/19/23 0308  FERRITIN 373*  TIBC 251  IRON 37   Urinalysis    Component Value Date/Time   COLORURINE YELLOW 02/15/2023 1522   APPEARANCEUR HAZY (A) 02/15/2023 1522   LABSPEC 1.017 02/15/2023 1522   PHURINE 5.0 02/15/2023 1522   GLUCOSEU NEGATIVE 02/15/2023 1522   HGBUR NEGATIVE 02/15/2023 1522   BILIRUBINUR NEGATIVE 02/15/2023 1522   BILIRUBINUR negative 09/27/2022 1131   BILIRUBINUR negative 07/11/2021 0812   KETONESUR NEGATIVE 02/15/2023 1522   PROTEINUR NEGATIVE 02/15/2023 1522   UROBILINOGEN 0.2 09/27/2022 1131   NITRITE NEGATIVE 02/15/2023 1522   LEUKOCYTESUR SMALL (A) 02/15/2023 1522   Sepsis Labs Recent Labs  Lab 02/17/23 0043 02/18/23 0016 02/19/23 0308 02/20/23 0252  WBC 11.9* 11.7* 10.6* 9.3  Microbiology Recent Results (from the past 240 hour(s))  MRSA Next Gen by PCR, Nasal     Status: None   Collection Time: 02/12/23 11:37 PM   Specimen: Nasal Mucosa; Nasal Swab  Result Value Ref  Range Status   MRSA by PCR Next Gen NOT DETECTED NOT DETECTED Final    Comment: (NOTE) The GeneXpert MRSA Assay (FDA approved for NASAL specimens only), is one component of a comprehensive MRSA colonization surveillance program. It is not intended to diagnose MRSA infection nor to guide or monitor treatment for MRSA infections. Test performance is not FDA approved in patients less than 51 years old. Performed at [facility], [address], [place], [place] [address]      Patient was seen and examined on the day of discharge and was found to be in stable condition. Time coordinating discharge: 40 minutes including assessment and coordination of care, as well as examination of the patient.   SIGNED:  Noralee Stain, DO [facility] 02/20/2023, 11:42 AM

## 2023-02-20 NOTE — TOC Transition Note (Signed)
Transition of Care [facility]) - CM/SW Discharge Note   Patient Details  Name: Dana [name] MRN: [id number] Date of Birth: 1961/04/29  Transition of [facility]) CM/SW Contact:  Harriet Masson, RN Phone Number: 02/20/2023, 12:41 PM   Clinical Narrative:    Patient stable for discharge.  No TOC needs at this time.    Final next level of care: Home/Self Care Barriers to Discharge: Barriers Resolved   Patient Goals and CMS Choice     Return home Discharge Placement               home          Discharge Plan and Services Additional resources added to the After Visit Summary for                                       Social Determinants of Health (SDOH) Interventions SDOH Screenings   Food Insecurity: No Food Insecurity (02/13/2023)  Housing: Low Risk  (02/13/2023)  Transportation Needs: No Transportation Needs (02/13/2023)  Utilities: Not At Risk (02/13/2023)  Alcohol Screen: Low Risk  (07/25/2022)  Depression (PHQ2-9): Medium Risk (02/05/2023)  Financial Resource Strain: Medium Risk (10/30/2022)  Physical Activity: Inactive (02/05/2023)  Social Connections: Moderately Integrated (07/25/2022)  Stress: Stress Concern Present (02/05/2023)  Tobacco Use: Medium Risk (02/17/2023)  Health Literacy: Adequate Health Literacy (01/30/2023)     Readmission Risk Interventions    12/16/2022   11:54 AM 05/19/2021    1:11 PM 10/06/2020    2:45 PM  Readmission Risk Prevention Plan  Transportation Screening Complete Complete Complete  PCP or Specialist Appt within 3-5 Days   Complete  Social Work Consult for Recovery Care Planning/Counseling   Complete  Palliative Care Screening   Not Applicable  Medication Review [facility]) Referral to Pharmacy Complete Complete  PCP or Specialist appointment within 3-5 days of discharge Complete Complete   HRI or Home Care Consult Complete Complete   SW Recovery Care/Counseling Consult Complete Complete   Palliative Care Screening  Not Applicable Not Applicable   Skilled Nursing Facility Not Applicable Not Applicable

## 2023-02-20 NOTE — Plan of Care (Signed)
  Problem: Education: Goal: Understanding of CV disease, CV risk reduction, and recovery process will improve Outcome: Progressing Goal: Individualized Educational Video(s) Outcome: Progressing   Problem: Activity: Goal: Ability to return to baseline activity level will improve Outcome: Progressing   Problem: Cardiovascular: Goal: Ability to achieve and maintain adequate cardiovascular perfusion will improve Outcome: Progressing Goal: Vascular access site(s) Level 0-1 will be maintained Outcome: Progressing   Problem: Health Behavior/Discharge Planning: Goal: Ability to safely manage health-related needs after discharge will improve Outcome: Progressing   Problem: Education: Goal: Ability to describe self-care measures that may prevent or decrease complications (Diabetes Survival Skills Education) will improve Outcome: Progressing Goal: Individualized Educational Video(s) Outcome: Progressing   Problem: Coping: Goal: Ability to adjust to condition or change in health will improve Outcome: Progressing   Problem: Fluid Volume: Goal: Ability to maintain a balanced intake and output will improve Outcome: Progressing   Problem: Health Behavior/Discharge Planning: Goal: Ability to identify and utilize available resources and services will improve Outcome: Progressing Goal: Ability to manage health-related needs will improve Outcome: Progressing   Problem: Metabolic: Goal: Ability to maintain appropriate glucose levels will improve Outcome: Progressing   Problem: Nutritional: Goal: Maintenance of adequate nutrition will improve Outcome: Progressing Goal: Progress toward achieving an optimal weight will improve Outcome: Progressing   Problem: Skin Integrity: Goal: Risk for impaired skin integrity will decrease Outcome: Progressing   Problem: Tissue Perfusion: Goal: Adequacy of tissue perfusion will improve Outcome: Progressing   Problem: Education: Goal:  Understanding of cardiac disease, CV risk reduction, and recovery process will improve Outcome: Progressing Goal: Individualized Educational Video(s) Outcome: Progressing   Problem: Activity: Goal: Ability to tolerate increased activity will improve Outcome: Progressing   Problem: Cardiac: Goal: Ability to achieve and maintain adequate cardiovascular perfusion will improve Outcome: Progressing   Problem: Health Behavior/Discharge Planning: Goal: Ability to safely manage health-related needs after discharge will improve Outcome: Progressing   Problem: Education: Goal: Knowledge of General Education information will improve Description: Including pain rating scale, medication(s)/side effects and non-pharmacologic comfort measures Outcome: Progressing   Problem: Health Behavior/Discharge Planning: Goal: Ability to manage health-related needs will improve Outcome: Progressing   Problem: Clinical Measurements: Goal: Ability to maintain clinical measurements within normal limits will improve Outcome: Progressing Goal: Will remain free from infection Outcome: Progressing Goal: Diagnostic test results will improve Outcome: Progressing Goal: Respiratory complications will improve Outcome: Progressing Goal: Cardiovascular complication will be avoided Outcome: Progressing   Problem: Activity: Goal: Risk for activity intolerance will decrease Outcome: Progressing   Problem: Nutrition: Goal: Adequate nutrition will be maintained Outcome: Progressing   Problem: Coping: Goal: Level of anxiety will decrease Outcome: Progressing   Problem: Elimination: Goal: Will not experience complications related to bowel motility Outcome: Progressing Goal: Will not experience complications related to urinary retention Outcome: Progressing   Problem: Pain Managment: Goal: General experience of comfort will improve Outcome: Progressing   Problem: Safety: Goal: Ability to remain free from  injury will improve Outcome: Progressing   Problem: Skin Integrity: Goal: Risk for impaired skin integrity will decrease Outcome: Progressing

## 2023-02-20 NOTE — Progress Notes (Signed)
Mobility Specialist: Progress Note   02/20/23 1013  Mobility  Activity Ambulated with assistance in hallway  Level of Assistance Contact guard assist, steadying assist  Assistive Device Cane  Distance Ambulated (ft) 130 ft  Activity Response Tolerated well  Mobility Referral Yes  $Mobility charge 1 Mobility  Mobility Specialist Start Time (ACUTE ONLY) 0945  Mobility Specialist Stop Time (ACUTE ONLY) 0955  Mobility Specialist Time Calculation (min) (ACUTE ONLY) 10 min   Pre Mobility: 74 HR, 95% SpO2 During Mobility: 88 HR, 93% SpO2 Post Mobility:  82 HR, 95% SpO2  Received pt in bed having no complaints and agreeable to mobility. SV for STS. Pt was asymptomatic throughout ambulation and returned to room w/o fault. Left in bed w/ call bell in reach and all needs met.   Maurene Capes Mobility Specialist Please contact via SecureChat or Rehab office at [phone]

## 2023-02-21 ENCOUNTER — Telehealth (HOSPITAL_COMMUNITY): Payer: Self-pay | Admitting: Adult Health

## 2023-02-21 ENCOUNTER — Other Ambulatory Visit: Payer: Self-pay

## 2023-02-21 ENCOUNTER — Telehealth: Payer: Self-pay

## 2023-02-21 ENCOUNTER — Telehealth (HOSPITAL_COMMUNITY): Payer: Self-pay

## 2023-02-21 ENCOUNTER — Inpatient Hospital Stay: Payer: PPO

## 2023-02-21 NOTE — Telephone Encounter (Signed)
Per phase I cardiac rehab, fax referral to [facility].

## 2023-02-21 NOTE — Telephone Encounter (Signed)
  Cardiomems Remote Monitoring  S/P Cardiomems Implant   PAD Goal: 10 Most recent reading: 15  Recommended changes: Elevated cardiomems reading suggestive of fluid accumulation. Please call. Take extra 40 mg torsemide for the next 2 days.   I continue to review and analyze the patients PA pressures weekly (and more often as needed) to bring PA pressures within the optimal range.       NP-C  2:36 PM

## 2023-02-21 NOTE — Patient Instructions (Signed)
Visit Information  Thank you for taking time to visit with me today. Please don't hesitate to contact me if I can be of assistance to you before our next scheduled telephone appointment.  Following are the goals we discussed today:  oday's TOC FU Call Status: Today's TOC FU Call Status:: Successful TOC FU Call Completed TOC FU Call Complete Date: 02/21/23   Transition Care Management Follow-up Telephone Call Date of Discharge: 02/20/23 Discharge Facility: [facility]) Type of Discharge: Inpatient Admission Primary Inpatient Discharge Diagnosis:: NSTEMI How have you been since you were released from the hospital?: Better Any questions or concerns?: No   Items Reviewed: Did you receive and understand the discharge instructions provided?: Yes Medications obtained,verified, and reconciled?: Yes (Medications Reviewed) Any new allergies since your discharge?: No Dietary orders reviewed?: Yes Type of Diet Ordered:: [facility]al[facility] Diet Do you have support at home?: Yes People in Home: spouse Name of Support/Comfort Primary Source: [name]- husband   Medications Reviewed Today: Medications Reviewed Today       Reviewed by Marlowe Sax, RN (Case Manager) on 02/21/23 at 1230  Med List Status: <None>    Medication Order Taking? Sig Documenting Provider Last Dose Status Informant  allopurinol (ZYLOPRIM) 300 MG tablet [id number] No TAKE ONE TABLET BY MOUTH ONCE DAILY Cox, Kirsten, MD 02/12/2023 Active Self  aspirin EC 81 MG tablet [id number] No Take 1 tablet (81 mg total) by mouth daily. Swallow whole. Cyndi Bender, NP 02/12/2023 Active Self  busPIRone (BUSPAR) 10 MG tablet [id number] No Take 1 tablet (10 mg total) by mouth 3 (three) times daily. Blane Ohara, MD 02/12/2023 Active Self  carvedilol (COREG) 12.5 MG tablet [id number] No TAKE ONE TABLET BY MOUTH twice daily Bensimhon, Bevelyn Buckles, MD 02/12/2023 0900 Active Self  cephALEXin (KEFLEX) 250 MG capsule [id number] No Take 250 mg by mouth at bedtime.  [provider] 02/11/2023 Active Self                   Med Note (SATTERFIELD, Vito Berger Dec 06, 2022  8:18 PM) Continues   Continuous Blood Gluc Sensor (FREESTYLE LIBRE 2 SENSOR) [place] [id number] No USE TO check blood glucose AS DIRECTED AND CHANGE sensor every 14 DAYS [provider] Taking Active Self  CRANBERRY PO [id number] No Take 2 tablets by mouth daily. [provider] 02/12/2023 Active Self  EPINEPHrine 0.3 mg/0.3 mL IJ SOAJ injection [id number] No Use as directed for life-threatening allergic reaction. Marcelyn Bruins, MD unk Active Self  ezetimibe (ZETIA) 10 MG tablet [id number] No Take 1 tablet (10 mg total) by mouth daily.  Patient taking differently: Take 10 mg by mouth every evening.   Blane Ohara, MD 02/11/2023 Active Self  HYDROcodone-acetaminophen (NORCO) 7.5-325 MG tablet [id number] No Take 1 tablet by mouth 3 (three) times daily as needed. Blane Ohara, MD 02/12/2023 Active Self  insulin aspart (NOVOLOG FLEXPEN) 100 UNIT/ML FlexPen [id number] No 2-10 U before meals based on SSI. Blane Ohara, MD 02/11/2023 Active Self  Insulin Pen Needle (BD PEN NEEDLE NANO 2ND GEN) 32G X 4 MM MISC [id number] No Inject 1 each into the skin in the morning, at noon, in the evening, and at bedtime. Cox, Kirsten, MD Taking Active Self  latanoprost (XALATAN) 0.005 % ophthalmic solution [id number] No Place 1 drop into both eyes at bedtime. [provider] 02/11/2023 Active Self  LEVEMIR FLEXPEN 100 UNIT/ML FlexPen [id number] No Inject 50 units into THE SKIN daily  Patient taking differently: Inject 55 Units  into the skin daily.   Blane Ohara, MD 02/12/2023 Active Self  linaclotide Karlene Einstein) 145 MCG CAPS capsule [id number] No TAKE ONE CAPSULE BY MOUTH BEFORE Suann Larry, MD 02/12/2023 Active Self  LORazepam (ATIVAN) 0.5 MG tablet [id number] No TAKE ONE TABLET BY MOUTH ONCE daily AS NEEDED FOR ANXIETY Blane Ohara, MD 02/11/2023 Active Self  magnesium oxide  (MAG-OX) 400 MG tablet [id number] No Take 1 tablet (400 mg total) by mouth daily. Take 2 (400 mg) daily=800 mg  Patient taking differently: Take 800 mg by mouth daily. Take 2 (400 mg) daily=800 mg   Rolly Salter, MD 02/12/2023 Active Self  nitroGLYCERIN (NITROSTAT) 0.4 MG SL tablet [id number] No DISSOLVE ONE TABLET UNDER THE TONGUE EVERY 5 MINUTES AS NEEDED FOR CHEST PAIN.  DO NOT EXCEED A TOTAL OF 3 DOSES IN 15 MINUTES Bensimhon, Bevelyn Buckles, MD 02/12/2023 Active Self  Omega-3 Fatty Acids (FISH OIL PO) [id number] No Take 1 capsule by mouth daily. [provider] 02/11/2023 Active Self  OXYGEN [id number] No Inhale 2 L into the lungs at bedtime. [provider] Taking Active Self  pantoprazole (PROTONIX) 40 MG tablet [id number] No TAKE ONE TABLET BY MOUTH ONCE DAILY Cox, Kirsten, MD 02/12/2023 Active Self  polyethylene glycol (MIRALAX / GLYCOLAX) 17 g packet [id number] No Take 17 g by mouth daily. Rolly Salter, MD Past Month Active Self  potassium chloride SA (KLOR-CON M) 20 MEQ tablet [id number] No TAKE ONE TABLET BY MOUTH ONCE DAILY  Patient taking differently: Take 20 mEq by mouth 2 (two) times a week.   Blane Ohara, MD 02/11/2023 Active Self  Probiotic CAPS [id number] No Take 1 capsule by mouth daily. [provider] 02/12/2023 Active Self  promethazine (PHENERGAN) 25 MG tablet [id number] No Take 1 tablet (25 mg total) by mouth daily as needed.  Patient taking differently: Take 25 mg by mouth daily as needed for nausea or vomiting.   Blane Ohara, MD 02/11/2023 Active Self  REPATHA SURECLICK 140 MG/ML SOAJ [id number] No inject 140mg  intot HEART SKIN every 14 DAYS Bensimhon, Bevelyn Buckles, MD 02/04/2023 Active Self  ticagrelor (BRILINTA) 90 MG TABS tablet [id number] No Take 1 tablet (90 mg total) by mouth 2 (two) times daily. [place] Rome [place], [place] 02/12/2023 0900 Active Self  torsemide 40 MG TABS [id number]   Take 40 mg by mouth daily. Noralee Stain, DO   Active                     Home Care and Equipment/Supplies: Were Home Health Services Ordered?: No Any new equipment or medical supplies ordered?: No   Functional Questionnaire: Do you need assistance with bathing/showering or dressing?: No Do you need assistance with meal preparation?: No Do you need assistance with eating?: No Do you have difficulty maintaining continence: No Do you need assistance with getting out of bed/getting out of a chair/moving?: No Do you have difficulty managing or taking your medications?: No   Follow up appointments reviewed: PCP Follow-up appointment confirmed?: No (The patient will call and schedule a post hospital follow up) MD Provider Line [phone] Given: Yes Specialist Hospital Follow-up appointment confirmed?: Yes Date of Specialist follow-up appointment?: 03/11/23 Follow-Up Specialty Provider:: Dr. Lawerance Sabal Do you need transportation to your follow-up appointment?: No Do you understand care options if your condition(s) worsen?: Yes-patient verbalized understanding       Alto Denver RN, MSN, CCM RN Care Manager  [facility]  Ambulatory Care Management  Direct Number: [phone]   Our next  appointment is by telephone on 03-22-2023 at 230 pm  Please call the care guide team at [phone] if you need to cancel or reschedule your appointment.   If you are experiencing a Mental Health or Behavioral Health Crisis or need someone to talk to, please call the Suicide and Crisis Lifeline: 988 call the [place] National Suicide Prevention Lifeline: (726)637-1333 or TTY: (831)411-5906 TTY 249 057 8621) to talk to a trained counselor call 1-800-273-TALK (toll free, 24 hour hotline) go to [facility] [address], [place] [phone])   Patient verbalizes understanding of instructions and care plan provided today and agrees to view in MyChart. Active MyChart status and patient understanding of how to access instructions and care  plan via MyChart confirmed with patient.       Alto Denver RN, MSN, CCM RN Care Manager  [facility]  Ambulatory Care Management  Direct Number: [phone]

## 2023-02-21 NOTE — Transitions of Care (Post Inpatient/ED Visit) (Signed)
02/21/2023  Name: Dana [name] MRN: [id number] DOB: 1960-11-30  Today's TOC FU Call Status: Today's TOC FU Call Status:: Successful TOC FU Call Completed TOC FU Call Complete Date: 02/21/23  Transition Care Management Follow-up Telephone Call Date of Discharge: 02/20/23 Discharge Facility: [facility]) Type of Discharge: Inpatient Admission Primary Inpatient Discharge Diagnosis:: NSTEMI How have you been since you were released from the hospital?: Better Any questions or concerns?: No  Items Reviewed: Did you receive and understand the discharge instructions provided?: Yes Medications obtained,verified, and reconciled?: Yes (Medications Reviewed) Any new allergies since your discharge?: No Dietary orders reviewed?: Yes Type of Diet Ordered:: Heart Healhty Diet Do you have support at home?: Yes People in Home: spouse Name of Support/Comfort Primary Source: [name]- husband  Medications Reviewed Today: Medications Reviewed Today     Reviewed by Marlowe Sax, RN (Case Manager) on 02/21/23 at 1230  Med List Status: <None>   Medication Order Taking? Sig Documenting Provider Last Dose Status Informant  allopurinol (ZYLOPRIM) 300 MG tablet [id number] No TAKE ONE TABLET BY MOUTH ONCE DAILY Cox, Kirsten, MD 02/12/2023 Active Self  aspirin EC 81 MG tablet [id number] No Take 1 tablet (81 mg total) by mouth daily. Swallow whole. Cyndi Bender, NP 02/12/2023 Active Self  busPIRone (BUSPAR) 10 MG tablet [id number] No Take 1 tablet (10 mg total) by mouth 3 (three) times daily. Blane Ohara, MD 02/12/2023 Active Self  carvedilol (COREG) 12.5 MG tablet [id number] No TAKE ONE TABLET BY MOUTH twice daily Bensimhon, Bevelyn Buckles, MD 02/12/2023 0900 Active Self  cephALEXin (KEFLEX) 250 MG capsule [id number] No Take 250 mg by mouth at bedtime. [provider] 02/11/2023 Active Self           Med Note (SATTERFIELD, Vito Berger Dec 06, 2022  8:18 PM) Continues   Continuous Blood Gluc Sensor (FREESTYLE  LIBRE 2 SENSOR) [place] [id number] No USE TO check blood glucose AS DIRECTED AND CHANGE sensor every 14 DAYS [provider] Taking Active Self  CRANBERRY PO [id number] No Take 2 tablets by mouth daily. [provider] 02/12/2023 Active Self  EPINEPHrine 0.3 mg/0.3 mL IJ SOAJ injection [id number] No Use as directed for life-threatening allergic reaction. Marcelyn Bruins, MD unk Active Self  ezetimibe (ZETIA) 10 MG tablet [id number] No Take 1 tablet (10 mg total) by mouth daily.  Patient taking differently: Take 10 mg by mouth every evening.   Blane Ohara, MD 02/11/2023 Active Self  HYDROcodone-acetaminophen (NORCO) 7.5-325 MG tablet [id number] No Take 1 tablet by mouth 3 (three) times daily as needed. Blane Ohara, MD 02/12/2023 Active Self  insulin aspart (NOVOLOG FLEXPEN) 100 UNIT/ML FlexPen [id number] No 2-10 U before meals based on SSI. Blane Ohara, MD 02/11/2023 Active Self  Insulin Pen Needle (BD PEN NEEDLE NANO 2ND GEN) 32G X 4 MM MISC [id number] No Inject 1 each into the skin in the morning, at noon, in the evening, and at bedtime. Cox, Kirsten, MD Taking Active Self  latanoprost (XALATAN) 0.005 % ophthalmic solution [id number] No Place 1 drop into both eyes at bedtime. [provider] 02/11/2023 Active Self  LEVEMIR FLEXPEN 100 UNIT/ML FlexPen [id number] No Inject 50 units into THE SKIN daily  Patient taking differently: Inject 55 Units into the skin daily.   Blane Ohara, MD 02/12/2023 Active Self  linaclotide Karlene Einstein) 145 MCG CAPS capsule [id number] No TAKE ONE CAPSULE BY MOUTH BEFORE Suann Larry, MD 02/12/2023 Active Self  LORazepam (ATIVAN) 0.5 MG tablet [id number] No TAKE ONE  TABLET BY MOUTH ONCE daily AS NEEDED FOR ANXIETY Cox, Kirsten, MD 02/11/2023 Active Self  magnesium oxide (MAG-OX) 400 MG tablet [id number] No Take 1 tablet (400 mg total) by mouth daily. Take 2 (400 mg) daily=800 mg  Patient taking differently: Take 800 mg by mouth daily. Take 2 (400 mg)  daily=800 mg   Rolly Salter, MD 02/12/2023 Active Self  nitroGLYCERIN (NITROSTAT) 0.4 MG SL tablet [id number] No DISSOLVE ONE TABLET UNDER THE TONGUE EVERY 5 MINUTES AS NEEDED FOR CHEST PAIN.  DO NOT EXCEED A TOTAL OF 3 DOSES IN 15 MINUTES Bensimhon, Bevelyn Buckles, MD 02/12/2023 Active Self  Omega-3 Fatty Acids (FISH OIL PO) [id number] No Take 1 capsule by mouth daily. [provider] 02/11/2023 Active Self  OXYGEN [id number] No Inhale 2 L into the lungs at bedtime. [provider] Taking Active Self  pantoprazole (PROTONIX) 40 MG tablet [id number] No TAKE ONE TABLET BY MOUTH ONCE DAILY Cox, Kirsten, MD 02/12/2023 Active Self  polyethylene glycol (MIRALAX / GLYCOLAX) 17 g packet [id number] No Take 17 g by mouth daily. Rolly Salter, MD Past Month Active Self  potassium chloride SA (KLOR-CON M) 20 MEQ tablet [id number] No TAKE ONE TABLET BY MOUTH ONCE DAILY  Patient taking differently: Take 20 mEq by mouth 2 (two) times a week.   Blane Ohara, MD 02/11/2023 Active Self  Probiotic CAPS [id number] No Take 1 capsule by mouth daily. [provider] 02/12/2023 Active Self  promethazine (PHENERGAN) 25 MG tablet [id number] No Take 1 tablet (25 mg total) by mouth daily as needed.  Patient taking differently: Take 25 mg by mouth daily as needed for nausea or vomiting.   Blane Ohara, MD 02/11/2023 Active Self  REPATHA SURECLICK 140 MG/ML SOAJ [id number] No inject 140mg  intot HEART SKIN every 14 DAYS Bensimhon, Bevelyn Buckles, MD 02/04/2023 Active Self  ticagrelor (BRILINTA) 90 MG TABS tablet [id number] No Take 1 tablet (90 mg total) by mouth 2 (two) times daily. Prince Rome [place], [place] 02/12/2023 0900 Active Self  torsemide 40 MG TABS [id number]  Take 40 mg by mouth daily. Noralee Stain, DO  Active             Home Care and Equipment/Supplies: Were Home Health Services Ordered?: No Any new equipment or medical supplies ordered?: No  Functional Questionnaire: Do you need assistance with  bathing/showering or dressing?: No Do you need assistance with meal preparation?: No Do you need assistance with eating?: No Do you have difficulty maintaining continence: No Do you need assistance with getting out of bed/getting out of a chair/moving?: No Do you have difficulty managing or taking your medications?: No  Follow up appointments reviewed: PCP Follow-up appointment confirmed?: No (The patient will call and schedule a post hospital follow up) MD Provider Line [phone] Given: Yes Specialist Hospital Follow-up appointment confirmed?: Yes Date of Specialist follow-up appointment?: 03/11/23 Follow-Up Specialty Provider:: Dr. Lawerance Sabal Do you need transportation to your follow-up appointment?: No Do you understand care options if your condition(s) worsen?: Yes-patient verbalized understanding    Alto Denver RN, MSN, CCM RN Care Manager  [facility]  Ambulatory Care Management  Direct Number: [phone]

## 2023-02-21 NOTE — Telephone Encounter (Signed)
Call patient to schedule hospital follow up

## 2023-02-21 NOTE — Telephone Encounter (Signed)
Pt aware.

## 2023-02-21 NOTE — Telephone Encounter (Signed)
-----   Message from Marlowe Sax sent at 02/21/2023 12:31 PM EDT ----- Regarding: The patient needs a post hospitla follow up with Dr. Sedalia Muta Hellom The patient was discharged yesterday from the hospital. Can someone assist her with getting a post hospital discharge appointment? Thank you for your help. Pam

## 2023-02-26 ENCOUNTER — Inpatient Hospital Stay: Payer: PPO | Admitting: Family Medicine

## 2023-02-26 NOTE — Progress Notes (Signed)
Subjective:  Patient ID: Dana [name], female    DOB: [birth year]  Age: 62 y.o. MRN: [id number]  Chief Complaint  Patient presents with   nstemi   URI   Patient was identified as a possible COVID infection and was isolated in the room.  Appropriate PPI measures were taken.  HPI SHe  was admitted on 02/12/23 and discharged 02/20/23 at [facility] She presented to the ED with chest pain. Patient noted to have substernal chest pressure with ambulation ongoing despite recent increase in her isosorbide. She took 3 nitroglycerin prior to presentation with ongoing symptoms. She was NSTEMI with known CAD, Acute renal failure superimposed on stage 3b chronic kidney disease, Chronic heart failure with preserved ejection fraction, Chronic respiratory failure with hypoxia.  Diuretics were adjusted due to acute kidney failure on chronic kidney disease.  Prior to [facility] patient had heparin and nitroglycerin drips. They stopped Isosorbide 60 mg and increased her torsemide to 60 mg daily. They continue with rest of home medicines upon discharge.  She had LEFT HEART CATH AND CORONARY ANGIOGRAPHY on 02/13/23. Cardiac catheterization done showed severe restenosis in the proximal LAD stent status post successful IVUS guided PTCA/DES x 1 proximal LAD. Chronic occlusion mid circumflex beyond stented segment. Patent obtuse marginal branch with severe distal stenosis which was too small for PCI. Dominant RCA with moderate proximal stenosis, patent distal stent noted.   Patient was feeling better after discharge, but patient is complaining today of body aches, chills, and nasal congestion. It started two-three days ago and she has been in and out of nursing home visiting her mom, where she was informed that they did have some possible cases of covid.  In addition her husband was sick 7 to 10 days ago also.  COVID was positive.       02/05/2023    3:34 PM 01/22/2023    9:42 AM 12/17/2022    3:41 PM 12/17/2022    11:43 AM 10/30/2022    2:26 PM  Depression screen PHQ 2/9  Decreased Interest 1 1 0 1 1  Down, Depressed, Hopeless 1 1 1 1 1   PHQ - 2 Score 2 2 1 2 2   Altered sleeping 1 1 3 1 1   Tired, decreased energy 1 1 3 1 1   Change in appetite 1 1 1 1 1   Feeling bad or failure about yourself  0 0 0 1 0  Trouble concentrating 0 0 0 0 0  Moving slowly or fidgety/restless 1 1 0 1 1  Suicidal thoughts 0 0 0 0 0  PHQ-9 Score 6 6 8 7 6   Difficult doing work/chores Somewhat difficult Somewhat difficult Somewhat difficult Somewhat difficult Somewhat difficult        12/17/2022    3:41 PM  Fall Risk   Falls in the past year? 1  Number falls in past yr: 1  Injury with Fall? 0  Risk for fall due to : No Fall Risks  Follow up Falls evaluation completed    Patient Care Team: Blane Ohara, MD as PCP - General (Family Medicine) Bensimhon, Bevelyn Buckles, MD as PCP - Cardiology (Cardiology) Marcelyn Bruins, MD as Consulting Physician (Allergy) Bensimhon, Bevelyn Buckles, MD as Consulting Physician (Cardiology) Beverly Gust, MD as Referring Physician (Gastroenterology) Mincey, Daisy Blossom, MD as Consulting Physician (Ophthalmology) Doristine Bosworth., MD as Consulting Physician (Endocrinology) Margarette Canada., DPM as Consulting Physician (Podiatry) Donnetta Hail, MD as Consulting Physician (Rheumatology) Barnabas Lister, MD as Referring  Physician (Nephrology) Zettie Pho, [facility] (Inactive) as Pharmacist (Pharmacist) Orbie Pyo, MD as Consulting Physician (Cardiology) Dellia Beckwith, MD as Consulting Physician (Oncology) Marisue Brooklyn, MD as Referring Physician Doristine Bosworth., MD (Endocrinology) Marlowe Sax, RN as Case Manager (General Practice)   Review of Systems  Constitutional:  Positive for chills. Negative for fatigue.  HENT:  Positive for postnasal drip. Negative for congestion, ear pain and sore throat.   Respiratory:  Negative for cough and shortness of  breath.   Cardiovascular:  Negative for chest pain.  Gastrointestinal:  Negative for abdominal pain, constipation, diarrhea, nausea and vomiting.  Genitourinary:  Negative for dysuria, frequency and urgency.  Musculoskeletal:  Positive for back pain. Negative for arthralgias and myalgias.  Neurological:  Positive for headaches. Negative for dizziness.  Psychiatric/Behavioral:  Negative for agitation and sleep disturbance. The patient is not nervous/anxious.     Current Outpatient Medications on File Prior to Visit  Medication Sig Dispense Refill   allopurinol (ZYLOPRIM) 300 MG tablet TAKE ONE TABLET BY MOUTH ONCE DAILY 90 tablet 0   aspirin EC 81 MG tablet Take 1 tablet (81 mg total) by mouth daily. Swallow whole. 30 tablet 12   busPIRone (BUSPAR) 10 MG tablet Take 1 tablet (10 mg total) by mouth 3 (three) times daily. 90 tablet 2   carvedilol (COREG) 12.5 MG tablet TAKE ONE TABLET BY MOUTH twice daily 180 tablet 3   cephALEXin (KEFLEX) 250 MG capsule Take 250 mg by mouth at bedtime.     Continuous Blood Gluc Sensor (FREESTYLE LIBRE 2 SENSOR) MISC USE TO check blood glucose AS DIRECTED AND CHANGE sensor every 14 DAYS     CRANBERRY PO Take 2 tablets by mouth daily.     EPINEPHrine 0.3 mg/0.3 mL IJ SOAJ injection Use as directed for life-threatening allergic reaction. 4 Device 3   ezetimibe (ZETIA) 10 MG tablet Take 1 tablet (10 mg total) by mouth daily. (Patient taking differently: Take 10 mg by mouth every evening.) 90 tablet 1   HYDROcodone-acetaminophen (NORCO) 7.5-325 MG tablet Take 1 tablet by mouth 3 (three) times daily as needed. 90 tablet 0   insulin aspart (NOVOLOG FLEXPEN) 100 UNIT/ML FlexPen 2-10 U before meals based on SSI. 15 mL 3   Insulin Pen Needle (BD PEN NEEDLE NANO 2ND GEN) 32G X 4 MM MISC Inject 1 each into the skin in the morning, at noon, in the evening, and at bedtime. 600 each 3   latanoprost (XALATAN) 0.005 % ophthalmic solution Place 1 drop into both eyes at bedtime.      LEVEMIR FLEXPEN 100 UNIT/ML FlexPen Inject 50 units into THE SKIN daily (Patient taking differently: Inject 55 Units into the skin daily.) 15 mL 1   linaclotide (LINZESS) 145 MCG CAPS capsule TAKE ONE CAPSULE BY MOUTH BEFORE BREAKFAST 90 capsule 0   LORazepam (ATIVAN) 0.5 MG tablet TAKE ONE TABLET BY MOUTH ONCE daily AS NEEDED FOR ANXIETY 30 tablet 2   magnesium oxide (MAG-OX) 400 MG tablet Take 1 tablet (400 mg total) by mouth daily. Take 2 (400 mg) daily=800 mg (Patient taking differently: Take 800 mg by mouth daily. Take 2 (400 mg) daily=800 mg) 60 tablet 1   nitroGLYCERIN (NITROSTAT) 0.4 MG SL tablet DISSOLVE ONE TABLET UNDER THE TONGUE EVERY 5 MINUTES AS NEEDED FOR CHEST PAIN.  DO NOT EXCEED A TOTAL OF 3 DOSES IN 15 MINUTES 25 tablet 0   Omega-3 Fatty Acids (FISH OIL PO) Take 1 capsule by  mouth daily.     OXYGEN Inhale 2 L into the lungs at bedtime.     pantoprazole (PROTONIX) 40 MG tablet TAKE ONE TABLET BY MOUTH ONCE DAILY 90 tablet 0   polyethylene glycol (MIRALAX / GLYCOLAX) 17 g packet Take 17 g by mouth daily. 14 each 0   potassium chloride SA (KLOR-CON M) 20 MEQ tablet TAKE ONE TABLET BY MOUTH ONCE DAILY (Patient taking differently: Take 20 mEq by mouth 2 (two) times a week.) 90 tablet 0   Probiotic CAPS Take 1 capsule by mouth daily.     REPATHA SURECLICK 140 MG/ML SOAJ inject 140mg  intot HEART SKIN every 14 DAYS 6 mL 1   ticagrelor (BRILINTA) 90 MG TABS tablet Take 1 tablet (90 mg total) by mouth 2 (two) times daily. 60 tablet 6   torsemide 40 MG TABS Take 40 mg by mouth daily. (Patient taking differently: Take 60 mg by mouth daily.) 30 tablet 0   No current facility-administered medications on file prior to visit.   Past Medical History:  Diagnosis Date   Acquired absence of left great toe (HCC)    Acquired absence of other left toe(s) (HCC)    ACS (acute coronary syndrome) (HCC)    Anemia    Anoxic brain injury (HCC)    Anxiety    CAD (coronary artery disease)    DES mLAD  04/07/15; DES dRCA & DES pPDA 08/30/16; DES mCX & PTCA OM1 09/29/20   Chronic diastolic (congestive) heart failure (HCC)    Chronic pain    Chronic systolic (congestive) heart failure (HCC)    CKD (chronic kidney disease)    Contrast dye induced nephropathy, possible 09/03/2020   COPD (chronic obstructive pulmonary disease) (HCC)    Diabetes (HCC)type 1    Diastolic CHF (HCC)    Dyspnea    Family history of breast cancer 10/23/2021   Gastro-esophageal reflux disease without esophagitis    Hyperlipidemia    Hypertension    Hypertensive heart disease with heart failure (HCC)    Hypoxemia    Idiopathic gout, multiple sites    Leukocytosis 03/29/2022   Localized edema    Low back pain    Mitral regurgitation    Mitral regurgitation    Mixed hyperlipidemia    Mixed incontinence    Morbid (severe) obesity due to excess calories (HCC)    Neuromuscular disorder (HCC)    Neuropathy    Non-ST elevation (NSTEMI) myocardial infarction (HCC)    08/29/20, 09/29/20   OSA (obstructive sleep apnea) 09/03/2020   Other specified hypothyroidism    PAF (paroxysmal atrial fibrillation) (HCC)    s/p pulmonary vein isolation by cryoablation 10/04/15 Dr. Rudolpho Sevin in [facility]   PEA (Pulseless electrical activity) [facility])    ~ 01/13/21 at [facility] during admission DKA, volume overload, possible CAP, hypoxia s/p ACLS with ROSC ~ 10-15   Pneumonia    Primary insomnia    Rheumatoid arthritis (HCC)    Stroke (HCC)    Thyroid disease    Type 1 diabetes mellitus (HCC)    Vitamin D deficiency    Past Surgical History:  Procedure Laterality Date   ABDOMINAL HYSTERECTOMY     Partial- Still has both ovaries   CARDIOVASCULAR STRESS TEST  08/13/2014   Nuclear; Normal   CARPAL TUNNEL RELEASE     CATARACT EXTRACTION, BILATERAL     CESAREAN SECTION     CHOLECYSTECTOMY     CORONARY ANGIOPLASTY WITH STENT PLACEMENT     3  blockages/ 1 stent   CORONARY PRESSURE/FFR STUDY N/A 09/07/2022   Procedure:  INTRAVASCULAR PRESSURE WIRE/FFR STUDY;  Surgeon: Swaziland, Peter M, MD;  Location: [facility] INVASIVE CV LAB;  Service: Cardiovascular;  Laterality: N/A;   CORONARY STENT INTERVENTION N/A 09/29/2020   Procedure: CORONARY STENT INTERVENTION;  Surgeon: Tonny Bollman, MD;  Location: [facility] INVASIVE CV LAB;  Service: Cardiovascular;  Laterality: N/A;  CFX   CORONARY STENT INTERVENTION N/A 09/07/2022   Procedure: CORONARY STENT INTERVENTION;  Surgeon: Swaziland, Peter M, MD;  Location: [facility] INVASIVE CV LAB;  Service: Cardiovascular;  Laterality: N/A;   CORONARY STENT INTERVENTION N/A 02/13/2023   Procedure: CORONARY STENT INTERVENTION;  Surgeon: Kathleene Hazel, MD;  Location: [facility] INVASIVE CV LAB;  Service: Cardiovascular;  Laterality: N/A;  LAD   CORONARY ULTRASOUND/IVUS N/A 02/13/2023   Procedure: Coronary Ultrasound/IVUS;  Surgeon: Kathleene Hazel, MD;  Location: [facility] INVASIVE CV LAB;  Service: Cardiovascular;  Laterality: N/A;   CORONARY/GRAFT ACUTE MI REVASCULARIZATION N/A 12/08/2022   Procedure: Coronary/Graft Acute MI Revascularization;  Surgeon: Marykay Lex, MD;  Location: [facility] INVASIVE CV LAB;  Service: Cardiovascular;  Laterality: N/A;   EYE SURGERY     LEFT HEART CATH AND CORONARY ANGIOGRAPHY N/A 09/29/2020   Procedure: LEFT HEART CATH AND CORONARY ANGIOGRAPHY;  Surgeon: Tonny Bollman, MD;  Location: [facility] INVASIVE CV LAB;  Service: Cardiovascular;  Laterality: N/A;   LEFT HEART CATH AND CORONARY ANGIOGRAPHY N/A 09/07/2022   Procedure: LEFT HEART CATH AND CORONARY ANGIOGRAPHY;  Surgeon: Swaziland, Peter M, MD;  Location: [facility] INVASIVE CV LAB;  Service: Cardiovascular;  Laterality: N/A;   LEFT HEART CATH AND CORONARY ANGIOGRAPHY N/A 12/08/2022   Procedure: LEFT HEART CATH AND CORONARY ANGIOGRAPHY;  Surgeon: Marykay Lex, MD;  Location: [facility] INVASIVE CV LAB;  Service: Cardiovascular;  Laterality: N/A;   LEFT HEART CATH AND CORONARY ANGIOGRAPHY N/A 02/13/2023   Procedure: LEFT HEART CATH AND CORONARY  ANGIOGRAPHY;  Surgeon: Kathleene Hazel, MD;  Location: [facility] INVASIVE CV LAB;  Service: Cardiovascular;  Laterality: N/A;   MITRAL VALVE REPAIR N/A 05/18/2021   Procedure: MITRAL VALVE REPAIR;  Surgeon: Orbie Pyo, MD;  Location: [facility] INVASIVE CV LAB;  Service: Cardiovascular;  Laterality: N/A;   RIGHT HEART CATH N/A 04/27/2021   Procedure: RIGHT HEART CATH;  Surgeon: Dolores Patty, MD;  Location: [facility] INVASIVE CV LAB;  Service: Cardiovascular;  Laterality: N/A;   RIGHT/LEFT HEART CATH AND CORONARY ANGIOGRAPHY N/A 01/07/2017   Procedure: Right/Left Heart Cath and Coronary Angiography;  Surgeon: Laurey Morale, MD;  Location: [facility] INVASIVE CV LAB;  Service: Cardiovascular;  Laterality: N/A;   ROTATOR CUFF REPAIR     TEE WITHOUT CARDIOVERSION N/A 04/28/2021   Procedure: TRANSESOPHAGEAL ECHOCARDIOGRAM (TEE);  Surgeon: Dolores Patty, MD;  Location: [facility] ENDOSCOPY;  Service: Cardiovascular;  Laterality: N/A;   TEE WITHOUT CARDIOVERSION N/A 05/18/2021   Procedure: TRANSESOPHAGEAL ECHOCARDIOGRAM (TEE);  Surgeon: Orbie Pyo, MD;  Location: [facility] INVASIVE CV LAB;  Service: Cardiovascular;  Laterality: N/A;   TOE AMPUTATION Left    US ECHOCARDIOGRAPHY  05/2017   Normal    Family History  Problem Relation Age of Onset   Breast cancer Mother 20       contralateral breast ca dx 55   Coronary artery disease Mother    Hypertension Mother    Hyperlipidemia Mother    Diabetes type II Mother    Lung cancer Mother 43   Diabetes Father    Hypertension Father    Mental  illness Sister    Bipolar disorder Other    Depression Other    Schizophrenia Other    Cancer Other        MGF's sisters x2; unknown type   Social History   Socioeconomic History   Marital status: Married    Spouse name: [name]   Number of children: 2   Years of education: 16   Highest education level: Master's degree (e.g., MA, MS, MEng, MEd, MSW, MBA)  Occupational History   Occupation: on disability/retired early  former Engineer, site  Tobacco Use   Smoking status: Former    Current packs/day: 0.00    Types: Cigarettes    Start date: 9    Quit date: 1984    Years since quitting: 40.6    Passive exposure: Past   Smokeless tobacco: Never  Vaping Use   Vaping status: Never Used  Substance and Sexual Activity   Alcohol use: Never   Drug use: Never   Sexual activity: Not on file  Other Topics Concern   Not on file  Social History Narrative   Not on file   Social Determinants of Health   Financial Resource Strain: Medium Risk (10/30/2022)   Overall Financial Resource Strain (CARDIA)    Difficulty of Paying Living Expenses: Somewhat hard  Food Insecurity: No Food Insecurity (02/13/2023)   Hunger Vital Sign    Worried About Running Out of Food in the Last Year: Never true    Ran Out of Food in the Last Year: Never true  Transportation Needs: No Transportation Needs (02/13/2023)   PRAPARE - Administrator, Civil Service (Medical): No    Lack of Transportation (Non-Medical): No  Physical Activity: Inactive (02/05/2023)   Exercise Vital Sign    Days of Exercise per Week: 0 days    Minutes of Exercise per Session: 0 min  Stress: Stress Concern Present (02/05/2023)   [employer] of Occupational Health - Occupational Stress Questionnaire    Feeling of Stress : To some extent  Social Connections: Moderately Integrated (07/25/2022)   Social Connection and Isolation Panel [NHANES]    Frequency of Communication with Friends and Family: More than three times a week    Frequency of Social Gatherings with Friends and Family: More than three times a week    Attends Religious Services: More than 4 times per year    Active Member of [employer] or Organizations: No    Attends Engineer, structural: Never    Marital Status: Married    Objective:  BP 110/62 (BP Location: Left Arm, Patient Position: Sitting, Cuff Size: Large)   Pulse 86   Temp 98.1 F (36.7 C) (Temporal)   Ht 5'  4" (1.626 m)   Wt 224 lb 12.8 oz (102 kg)   SpO2 98%   BMI 38.59 kg/m      02/27/2023    1:28 PM 02/20/2023   11:42 AM 02/20/2023    7:55 AM  BP/Weight  Systolic BP 110 127 122  Diastolic BP 62 45 41  Wt. (Lbs) 224.8    BMI 38.59 kg/m2     Limited exam done due to positive COVID and mild symptoms.  Physical Exam Vitals reviewed.  Constitutional:      Appearance: Normal appearance.  Cardiovascular:     Rate and Rhythm: Normal rate and regular rhythm.     Heart sounds: Normal heart sounds.  Pulmonary:     Effort: Pulmonary effort is normal.     Breath  sounds: Normal breath sounds.  Neurological:     Mental Status: She is alert.     Diabetic Foot Exam - Simple   No data filed      Lab Results  Component Value Date   WBC 8.8 02/27/2023   HGB 9.5 (L) 02/27/2023   HCT 31.2 (L) 02/27/2023   PLT 260 02/27/2023   GLUCOSE 283 (H) 02/27/2023   CHOL 128 12/19/2022   TRIG 115 12/19/2022   HDL 41 12/19/2022   LDLCALC 66 12/19/2022   ALT 15 02/27/2023   AST 16 02/27/2023   NA 135 02/27/2023   K 4.3 02/27/2023   CL 98 02/27/2023   CREATININE 2.18 (H) 02/27/2023   BUN 57 (H) 02/27/2023   CO2 20 02/27/2023   TSH 2.900 01/19/2022   INR 1.1 05/16/2021   HGBA1C 8.5 (H) 02/13/2023      Assessment & Plan:    COVID-19 virus infection Assessment & Plan: Sent Monulpiravir  Orders: -     POC COVID-19 BinaxNow -     molnupiravir EUA; Take 4 capsules (800 mg total) by mouth 2 (two) times daily for 5 days.  Dispense: 40 capsule; Refill: 0  Acute upper respiratory infection Assessment & Plan: Order rapid Covid test and Flu test.  COVID-positive. Fever management with Tylenol.  Recommend eat 3 meals a day and monitor for dehydration.  Orders: -     POC COVID-19 BinaxNow -     POCT Influenza A/B -     molnupiravir EUA; Take 4 capsules (800 mg total) by mouth 2 (two) times daily for 5 days.  Dispense: 40 capsule; Refill: 0  NSTEMI (non-ST elevated myocardial infarction)  [facility]) Assessment & Plan: Management per specialist.  Patient has follow-up with cardiology set up.S/P stent.  Continue carvedilol 12.5 mg twice daily, torsemide 40 mg daily, Repatha 140 mg SQ every 2 weeks, Zetia 10 mg p.o. daily, aspirin 81 mg daily Brilinta 90 mg twice daily.   Anemia in stage 3b chronic kidney disease (HCC) Assessment & Plan: Check blood count. Acute kidney failure resolved.  Orders: -     CBC with Differential/Platelet -     Comprehensive metabolic panel -     [facility] CKD Program  Coronary artery disease involving native coronary artery of native heart without angina pectoris Assessment & Plan: Management per specialist.  Continue carvedilol 12.5 mg twice daily, torsemide 40 mg daily, Repatha 140 mg SQ every 2 weeks, Zetia 10 mg p.o. daily, aspirin 81 mg daily Brilinta 90 mg twice daily.   Acute renal failure superimposed on stage 3b chronic kidney disease, unspecified acute renal failure type [facility]) Assessment & Plan: Improved with hydration in the hospital. Recheck CMP      Meds ordered this encounter  Medications   molnupiravir EUA (LAGEVRIO) 200 mg CAPS capsule    Sig: Take 4 capsules (800 mg total) by mouth 2 (two) times daily for 5 days.    Dispense:  40 capsule    Refill:  0    Orders Placed This Encounter  Procedures   CBC with Differential/Platelet   Comprehensive metabolic panel   [facility] CKD Program   POC COVID-19   Influenza A/B     Follow-up: No follow-ups on file.   I,Katherina A Bramblett,acting as a scribe for Blane Ohara, MD.,have documented all relevant documentation on the behalf of Blane Ohara, MD,as directed by  Blane Ohara, MD while in the presence of Blane Ohara, MD.   Clayborn Bigness I Leal-Borjas,acting as a  scribe for Blane Ohara, MD.,have documented all relevant documentation on the behalf of Blane Ohara, MD,as directed by  Blane Ohara, MD while in the presence of Blane Ohara, MD.    An After Visit Summary was printed  and given to the patient.  Blane Ohara, MD [facility] [phone]

## 2023-02-27 ENCOUNTER — Encounter: Payer: Self-pay | Admitting: Family Medicine

## 2023-02-27 ENCOUNTER — Ambulatory Visit: Payer: PPO | Admitting: Family Medicine

## 2023-02-27 VITALS — BP 110/62 | HR 86 | Temp 98.1°F | Ht 64.0 in | Wt 224.8 lb

## 2023-02-27 DIAGNOSIS — I214 Non-ST elevation (NSTEMI) myocardial infarction: Secondary | ICD-10-CM | POA: Diagnosis not present

## 2023-02-27 DIAGNOSIS — N179 Acute kidney failure, unspecified: Secondary | ICD-10-CM | POA: Diagnosis not present

## 2023-02-27 DIAGNOSIS — U071 COVID-19: Secondary | ICD-10-CM

## 2023-02-27 DIAGNOSIS — D631 Anemia in chronic kidney disease: Secondary | ICD-10-CM | POA: Diagnosis not present

## 2023-02-27 DIAGNOSIS — J069 Acute upper respiratory infection, unspecified: Secondary | ICD-10-CM | POA: Diagnosis not present

## 2023-02-27 DIAGNOSIS — S98132S Complete traumatic amputation of one left lesser toe, sequela: Secondary | ICD-10-CM

## 2023-02-27 DIAGNOSIS — N1832 Chronic kidney disease, stage 3b: Secondary | ICD-10-CM

## 2023-02-27 DIAGNOSIS — I200 Unstable angina: Secondary | ICD-10-CM

## 2023-02-27 DIAGNOSIS — I2510 Atherosclerotic heart disease of native coronary artery without angina pectoris: Secondary | ICD-10-CM | POA: Diagnosis not present

## 2023-02-27 DIAGNOSIS — N184 Chronic kidney disease, stage 4 (severe): Secondary | ICD-10-CM | POA: Diagnosis not present

## 2023-02-27 LAB — POC COVID19 BINAXNOW: SARS Coronavirus 2 Ag: POSITIVE — AB

## 2023-02-27 LAB — POCT INFLUENZA A/B
Influenza A, POC: NEGATIVE
Influenza B, POC: NEGATIVE

## 2023-02-27 MED ORDER — MOLNUPIRAVIR EUA 200MG CAPSULE
4.0000 | ORAL_CAPSULE | Freq: Two times a day (BID) | ORAL | 0 refills | Status: AC
Start: 2023-02-27 — End: 2023-03-04

## 2023-02-28 ENCOUNTER — Other Ambulatory Visit: Payer: Self-pay | Admitting: Family Medicine

## 2023-02-28 LAB — COMPREHENSIVE METABOLIC PANEL
ALT: 15 IU/L (ref 0–32)
AST: 16 IU/L (ref 0–40)
Albumin: 3.8 g/dL (ref 3.8–4.9)
Alkaline Phosphatase: 178 IU/L — ABNORMAL HIGH (ref 44–121)
BUN/Creatinine Ratio: 26 (ref 12–28)
BUN: 57 mg/dL — ABNORMAL HIGH (ref 8–27)
Bilirubin Total: 0.5 mg/dL (ref 0.0–1.2)
CO2: 20 mmol/L (ref 20–29)
Calcium: 8.3 mg/dL — ABNORMAL LOW (ref 8.7–10.3)
Chloride: 98 mmol/L (ref 96–106)
Creatinine, Ser: 2.18 mg/dL — ABNORMAL HIGH (ref 0.57–1.00)
Globulin, Total: 3.2 g/dL (ref 1.5–4.5)
Glucose: 283 mg/dL — ABNORMAL HIGH (ref 70–99)
Potassium: 4.3 mmol/L (ref 3.5–5.2)
Sodium: 135 mmol/L (ref 134–144)
Total Protein: 7 g/dL (ref 6.0–8.5)
eGFR: 25 mL/min/{1.73_m2} — ABNORMAL LOW (ref >59)

## 2023-02-28 LAB — CBC WITH DIFFERENTIAL/PLATELET
Basophils Absolute: 0 10*3/uL (ref 0.0–0.2)
Basos: 1 %
EOS (ABSOLUTE): 0.1 10*3/uL (ref 0.0–0.4)
Eos: 1 %
Hematocrit: 31.2 % — ABNORMAL LOW (ref 34.0–46.6)
Hemoglobin: 9.5 g/dL — ABNORMAL LOW (ref 11.1–15.9)
Immature Grans (Abs): 0 10*3/uL (ref 0.0–0.1)
Immature Granulocytes: 1 %
Lymphocytes Absolute: 0.8 10*3/uL (ref 0.7–3.1)
Lymphs: 9 %
MCH: 26.8 pg (ref 26.6–33.0)
MCHC: 30.4 g/dL — ABNORMAL LOW (ref 31.5–35.7)
MCV: 88 fL (ref 79–97)
Monocytes Absolute: 1.2 10*3/uL — ABNORMAL HIGH (ref 0.1–0.9)
Monocytes: 14 %
Neutrophils Absolute: 6.7 10*3/uL (ref 1.4–7.0)
Neutrophils: 74 %
Platelets: 260 10*3/uL (ref 150–450)
RBC: 3.55 x10E6/uL — ABNORMAL LOW (ref 3.77–5.28)
RDW: 16 % — ABNORMAL HIGH (ref 11.7–15.4)
WBC: 8.8 10*3/uL (ref 3.4–10.8)

## 2023-02-28 LAB — LITHOLINK CKD PROGRAM

## 2023-03-03 ENCOUNTER — Encounter: Payer: Self-pay | Admitting: Oncology

## 2023-03-03 ENCOUNTER — Encounter: Payer: Self-pay | Admitting: Family Medicine

## 2023-03-03 DIAGNOSIS — U071 COVID-19: Secondary | ICD-10-CM | POA: Insufficient documentation

## 2023-03-03 DIAGNOSIS — J208 Acute bronchitis due to other specified organisms: Secondary | ICD-10-CM | POA: Insufficient documentation

## 2023-03-03 DIAGNOSIS — J069 Acute upper respiratory infection, unspecified: Secondary | ICD-10-CM | POA: Insufficient documentation

## 2023-03-03 HISTORY — DX: COVID-19: U07.1

## 2023-03-03 HISTORY — DX: Acute bronchitis due to other specified organisms: J20.8

## 2023-03-03 NOTE — Assessment & Plan Note (Addendum)
Check blood count. Acute kidney failure resolved.

## 2023-03-03 NOTE — Assessment & Plan Note (Addendum)
Order rapid Covid test and Flu test.  COVID-positive. Fever management with Tylenol.  Recommend eat 3 meals a day and monitor for dehydration.

## 2023-03-03 NOTE — Assessment & Plan Note (Signed)
Management per specialist.  Continue carvedilol 12.5 mg twice daily, torsemide 40 mg daily, Repatha 140 mg SQ every 2 weeks, Zetia 10 mg p.o. daily, aspirin 81 mg daily Brilinta 90 mg twice daily.

## 2023-03-03 NOTE — Assessment & Plan Note (Signed)
Improved with hydration in the hospital. Recheck CMP

## 2023-03-03 NOTE — Assessment & Plan Note (Signed)
Sent Monulpiravir

## 2023-03-03 NOTE — Assessment & Plan Note (Addendum)
Management per specialist.  Patient has follow-up with cardiology set up.S/P stent.  Continue carvedilol 12.5 mg twice daily, torsemide 40 mg daily, Repatha 140 mg SQ every 2 weeks, Zetia 10 mg p.o. daily, aspirin 81 mg daily Brilinta 90 mg twice daily.

## 2023-03-03 NOTE — Assessment & Plan Note (Signed)
>>  ASSESSMENT AND PLAN FOR CAD (CORONARY ARTERY DISEASE) WRITTEN ON 03/03/2023  5:27 PM BY COX, KIRSTEN, MD  Management per specialist.  Continue carvedilol 12.5 mg twice daily, torsemide 40 mg daily, Repatha 140 mg SQ every 2 weeks, Zetia 10 mg p.o. daily, aspirin 81 mg daily Brilinta 90 mg twice daily.

## 2023-03-04 ENCOUNTER — Ambulatory Visit (INDEPENDENT_AMBULATORY_CARE_PROVIDER_SITE_OTHER): Payer: PPO

## 2023-03-04 DIAGNOSIS — Z1152 Encounter for screening for COVID-19: Secondary | ICD-10-CM

## 2023-03-04 LAB — POC COVID19 BINAXNOW

## 2023-03-04 NOTE — Patient Instructions (Signed)
Covid test negative

## 2023-03-04 NOTE — Progress Notes (Signed)
Patient is in office today for a nurse visit for covid screening.  Her symptoms have cleared but she is unable to visit her Mother in the Nursing Home until her covid test is negative.  Screening covid test is negative today in the clinic.

## 2023-03-05 DIAGNOSIS — F411 Generalized anxiety disorder: Secondary | ICD-10-CM | POA: Diagnosis not present

## 2023-03-06 ENCOUNTER — Other Ambulatory Visit: Payer: PPO

## 2023-03-06 ENCOUNTER — Telehealth: Payer: PPO

## 2023-03-08 ENCOUNTER — Other Ambulatory Visit: Payer: Self-pay | Admitting: Family Medicine

## 2023-03-11 ENCOUNTER — Other Ambulatory Visit: Payer: PPO

## 2023-03-11 ENCOUNTER — Encounter (HOSPITAL_COMMUNITY): Payer: PPO

## 2023-03-11 NOTE — Progress Notes (Signed)
   03/11/2023  Patient ID: Dana [name], female   DOB: Apr 15, 1961, 63 y.o.   MRN: [id number]  Outreach for scheduled telephone visit was unsuccessful.  Appointment was initially for this morning, but patient answered and was with her mother at the hospital.  She requested to move the appointment to 4pm; but upon trying to call x2, phone went straight to voicemail.  Voicemail was full, so I was not able to leave a message.  I will coordinate with scheduler to try to reach out next week to reschedule.  Lenna Gilford, PharmD, DPLA

## 2023-03-12 MED ORDER — HYDROCODONE-ACETAMINOPHEN 7.5-325 MG PO TABS: 1.0000 | ORAL_TABLET | Freq: Three times a day (TID) | ORAL | 0 refills | Status: DC | PRN

## 2023-03-13 ENCOUNTER — Other Ambulatory Visit: Payer: Self-pay | Admitting: Family Medicine

## 2023-03-15 ENCOUNTER — Other Ambulatory Visit: Payer: PPO | Admitting: Pharmacist

## 2023-03-15 NOTE — Patient Instructions (Signed)
Dana [name],  Thank you for speaking with me today! As discussed, we can get your medications transferred over to [name] pharmacy which can deliver them right to your home.   Your healthwell program for cholesterol injection is actually approved through May of 2025, so no need for [name] to take action on that right now.  Otherwise, if you have questions or concerns you can reach me at [phone].  Since our call had to end quickly, I'll schedule for [name] to touch base in a few weeks, just to make sure you are supported through the transition of pharmacies.  Take care, [name], PharmD, BCPS Clinical Pharmacist [facility]

## 2023-03-15 NOTE — Progress Notes (Signed)
03/15/2023 Name: Dana [name] MRN: [id number] DOB: 10-16-1960   Dana [name] is a 62 y.o. year old female who presented for a telephone visit.    They were referred to the pharmacist by  [facility]  for assistance in managing medication access and transfer of care .    Subjective:  Care Team: Primary Care Provider: Blane Ohara, MD  Medication Access/Adherence  Current Pharmacy:  [facility] [address] [place] - [address] [place] [address] Phone: [phone] Fax: [phone]  [facility] [address] [place] [address] Phone: [phone] Fax: [phone]   Patient reports affordability concerns with their medications: No  Patient reports access/transportation concerns to their pharmacy: Yes needs [facility] Patient reports adherence concerns with their medications:  No     Medication Management:  Assessed patient for medication access barriers: - Currently uses [employer]well grant for repatha copay costs - [employer] ID [id number] and good through 11/22/2023. - No other PAPs or ongoing medication programs.  - Seeking transfer of pharmacy, needs to send medications to [facility] pharmacy. - Patient expressed all medications running smoothly at this time, no concerns with adherence, just needs sent to new pharmacy.   Objective:  Lab Results  Component Value Date   HGBA1C 8.5 (H) 02/13/2023    Lab Results  Component Value Date   CREATININE 2.18 (H) 02/27/2023   BUN 57 (H) 02/27/2023   NA 135 02/27/2023   K 4.3 02/27/2023   CL 98 02/27/2023   CO2 20 02/27/2023    Lab Results  Component Value Date   CHOL 128 12/19/2022   HDL 41 12/19/2022   LDLCALC 66 12/19/2022   TRIG 115 12/19/2022   CHOLHDL 3.1 12/19/2022    Medications Reviewed Today     Reviewed by Gabriel Carina, RPH (Pharmacist) on 03/15/23 at 1504   Med List Status: <None>   Medication Order Taking? Sig Documenting Provider Last Dose Status Informant  allopurinol (ZYLOPRIM) 300 MG tablet [id number] Yes TAKE ONE TABLET BY MOUTH ONCE DAILY Cox, Kirsten, MD Taking Active Self  aspirin EC 81 MG tablet [id number] Yes Take 1 tablet (81 mg total) by mouth daily. Swallow whole. Cyndi Bender, NP Taking Active Self  busPIRone (BUSPAR) 10 MG tablet [id number] Yes Take 1 tablet (10 mg total) by mouth 3 (three) times daily. Cox, Kirsten, MD Taking Active Self  carvedilol (COREG) 12.5 MG tablet [id number] Yes TAKE ONE TABLET BY MOUTH twice daily Bensimhon, Bevelyn Buckles, MD Taking Active Self  cephALEXin (KEFLEX) 250 MG capsule [id number] Yes Take 250 mg by mouth at bedtime. [provider] Taking Active Self           Med Note (SATTERFIELD, Genoveva Ill   Thu Dec 06, 2022  8:18 PM) Continues   Continuous Blood Gluc Sensor (FREESTYLE LIBRE 2 SENSOR) [place] [id number] Yes USE TO check blood glucose AS DIRECTED AND CHANGE sensor every 14 DAYS [provider] Taking Active Self  CRANBERRY PO [id number] Yes Take 2 tablets by mouth daily. [provider] Taking Active Self  EPINEPHrine 0.3 mg/0.3 mL IJ SOAJ injection [id number] Yes Use as directed for life-threatening allergic reaction. Marcelyn Bruins, MD Taking Active Self  ezetimibe (ZETIA) 10 MG tablet [id number] Yes Take 1 tablet (10 mg total) by mouth daily.  Patient taking differently: Take 10 mg by mouth every evening.   Blane Ohara, MD Taking  Active Self  HYDROcodone-acetaminophen (NORCO) 7.5-325 MG tablet [id number] Yes Take 1 tablet by mouth 3 (three) times daily as needed. Cox, Kirsten, MD Taking Active   insulin aspart (NOVOLOG FLEXPEN) 100 UNIT/ML FlexPen [id number] Yes 2-10 U before meals based on SSI. Cox, Kirsten, MD Taking Active Self  Insulin Pen Needle (BD PEN NEEDLE NANO 2ND GEN) 32G X 4 MM MISC [id number] Yes Inject 1 each into the skin in the morning, at noon, in the evening,  and at bedtime. Cox, Kirsten, MD Taking Active Self  latanoprost (XALATAN) 0.005 % ophthalmic solution [id number] Yes Place 1 drop into both eyes at bedtime. [provider] Taking Active Self  LEVEMIR FLEXPEN 100 UNIT/ML FlexPen [id number] Yes Inject 50 units into THE SKIN daily  Patient taking differently: Inject 55 Units into the skin daily.   Blane Ohara, MD Taking Active Self  linaclotide Karlene Einstein) 145 MCG CAPS capsule [id number] Yes TAKE ONE CAPSULE BY MOUTH BEFORE Girtha Rm, Kirsten, MD Taking Active Self  LORazepam (ATIVAN) 0.5 MG tablet [id number] Yes TAKE ONE TABLET BY MOUTH ONCE daily AS NEEDED FOR ANXIETY Cox, Kirsten, MD Taking Active Self  magnesium oxide (MAG-OX) 400 MG tablet [id number] Yes Take 1 tablet (400 mg total) by mouth daily. Take 2 (400 mg) daily=800 mg  Patient taking differently: Take 800 mg by mouth daily. Take 2 (400 mg) daily=800 mg   Rolly Salter, MD Taking Active Self  nitroGLYCERIN (NITROSTAT) 0.4 MG SL tablet [id number] Yes DISSOLVE ONE TABLET UNDER THE TONGUE EVERY 5 MINUTES AS NEEDED FOR CHEST PAIN.  DO NOT EXCEED A TOTAL OF 3 DOSES IN 15 MINUTES Bensimhon, Bevelyn Buckles, MD Taking Active Self  Omega-3 Fatty Acids (FISH OIL PO) [id number] Yes Take 1 capsule by mouth daily. [provider] Taking Active Self  OXYGEN [id number] Yes Inhale 2 L into the lungs at bedtime. [provider] Taking Active Self  pantoprazole (PROTONIX) 40 MG tablet [id number] Yes TAKE ONE TABLET BY MOUTH ONCE DAILY Cox, Kirsten, MD Taking Active Self  polyethylene glycol (MIRALAX / GLYCOLAX) 17 g packet [id number] Yes Take 17 g by mouth daily. Rolly Salter, MD Taking Active Self  potassium chloride SA (KLOR-CON M) 20 MEQ tablet [id number] Yes TAKE ONE TABLET BY MOUTH ONCE DAILY  Patient taking differently: Take 20 mEq by mouth 2 (two) times a week.   Blane Ohara, MD Taking Active Self  Probiotic CAPS [id number] Yes Take 1 capsule by mouth daily. [provider] Taking Active Self  promethazine (PHENERGAN) 25 MG tablet [id number] Yes TAKE ONE TABLET BY MOUTH DAILY AS NEEDED Langley Gauss, PA Taking Active   REPATHA SURECLICK 140 MG/ML Ivory Broad [id number] Yes inject 140mg  intot HEART SKIN every 14 DAYS Bensimhon, Bevelyn Buckles, MD Taking Active Self  ticagrelor (BRILINTA) 90 MG TABS tablet [id number] Yes Take 1 tablet (90 mg total) by mouth 2 (two) times daily. Jacklynn Ganong, FNP Taking Active Self  torsemide 40 MG TABS [id number] Yes Take 40 mg by mouth daily.  Patient taking differently: Take 60 mg by mouth daily.   Noralee Stain, DO Taking Active            Med Note Gabriel Carina   Fri Mar 15, 2023  3:04 PM) Taking 4 tablets daily of 20mg , equals 80mg  daily dose as of 03/15/23              Assessment/Plan:   Medication Management: - Currently strategy sufficient to maintain appropriate adherence to prescribed medication regimen -  Offered adherence packaging, patient is amenable to this in the future - For now, will prioritize transfer of prescriptions to [facility] for home delivery, facilitated with Lucinda Dell via teams - Patient had to end call abruptly, will outreach to ensure medications are transferred smoothly and address any additional needs    Follow Up Plan: 2-4 weeks  Lynnda Shields, PharmD, BCPS Clinical Pharmacist [facility]

## 2023-03-19 ENCOUNTER — Other Ambulatory Visit: Payer: Self-pay

## 2023-03-22 ENCOUNTER — Telehealth: Payer: Self-pay

## 2023-03-22 ENCOUNTER — Other Ambulatory Visit: Payer: PPO

## 2023-03-22 NOTE — Patient Outreach (Signed)
  Care Management   Follow Up Note   03/22/2023 Name: Dana [name] MRN: [id number] DOB: 1961-04-16   Referred by: Blane Ohara, MD Reason for referral : Care Management (RNCM: Follow up for Chronic Disease Management and Care Coordination Needs= the patient ask for a reschedule since she was driving- appointment rescheduled. )   Called to do regular outreach with the patient. Did talk to the patient but she was driving with her mother in the care and ask to be rescheduled since she was driving and wanted to be safe. Will call the patient next week.   Follow Up Plan: Telephone follow up appointment with care management team member scheduled for: 03-28-2023 at 230 pm  Alto Denver RN, MSN, CCM RN Care Manager  [facility]  Ambulatory Care Management  Direct Number: [phone]

## 2023-03-22 NOTE — Progress Notes (Signed)
Error

## 2023-03-26 ENCOUNTER — Encounter (HOSPITAL_COMMUNITY): Payer: PPO

## 2023-03-26 ENCOUNTER — Ambulatory Visit: Payer: Self-pay | Admitting: Licensed Clinical Social Worker

## 2023-03-26 NOTE — Patient Outreach (Signed)
  Care Coordination   Follow Up Visit Note   03/26/2023 Name: Dana [name] MRN: [id number] DOB: 1961/04/23  Dana [name] is a 62 y.o. year old female who sees Cox, Kirsten, MD for primary care. I spoke with  Dana [name] by phone today.  What matters to the patients health and wellness today? Patient has stated she has a blockage in her heart. Medical  providers are monitoring her heart blockage.  She participates in cardiac rehab program     Goals Addressed             This Visit's Progress    Patient Stated she has a blockage in her heart. Medical providers are monitoring heart blockage .She participates in cardiac rehab program       Interventions: Spoke with client  via phone today about her current  needs. Discussed program support with RN, LCSW, Pharmacist Client has said that her spouse is supportive. Client has said she likes caring for her pet birds as a way to relax Reviewed medication procurement. She had prescribed medications Discussed mood of client.  Client is anxious at this time due to the fact that her mother has a broken leg and is in a nursing care facility. Client is trying to help her mother as needed. Client said she, client, was feeling better now. Client asked if she could call LCSW back at a later time to discuss her SW needs LCSW gave client LCSW phone number of [phone] and encouraged her to call LCSW as needed for SW support LCSW thanked client for phone call with LCSW today          SDOH assessments and interventions completed:  Yes  SDOH Interventions Today    Flowsheet Row Most Recent Value  SDOH Interventions   Depression Interventions/Treatment  Counseling  Physical Activity Interventions Other (Comments)  [occasional mobility issues]  Stress Interventions Other (Comment)  [client has stress in managing her medical needs]        Care Coordination Interventions:  Yes, provided    Interventions Today    Flowsheet Row Most  Recent Value  Chronic Disease   Chronic disease during today's visit Other  [spoke with client about client needs]  General Interventions   General Interventions Discussed/Reviewed General Interventions Discussed, Community Resources  Education Interventions   Education Provided Provided Education  Provided Verbal Education On [employer]  Mental Health Interventions   Mental Health Discussed/Reviewed Coping Strategies  [client is trying to manage stress issues. Client's mother has a broken leg and client is trying to assist her mother as needed]  Pharmacy Interventions   Pharmacy Dicussed/Reviewed Pharmacy Topics Discussed       Follow up plan: Client to call LCSW at [phone] as able to discuss SW needs of client at that time   Encounter Outcome:  Patient Visit Completed   Kelton Pillar.Neha Waight MSW, LCSW Licensed Visual merchandiser [facility] Care Management [phone]

## 2023-03-26 NOTE — Patient Instructions (Signed)
Visit Information  Thank you for taking time to visit with me today. Please don't hesitate to contact me if I can be of assistance to you.   Following are the goals we discussed today:   Goals Addressed             This Visit's Progress    Patient Stated she has a blockage in her heart. Medical providers are monitoring heart blockage .She participates in cardiac rehab program       Interventions: Spoke with client  via phone today about her current  needs. Discussed program support with RN, LCSW, Pharmacist Client has said that her spouse is supportive. Client has said she likes caring for her pet birds as a way to relax Reviewed medication procurement. She had prescribed medications Discussed mood of client.  Client is anxious at this time due to the fact that her mother has a broken leg and is in a nursing care facility. Client is trying to help her mother as needed. Client said she, client, was feeling better now. Client asked if she could call LCSW back at a later time to discuss her SW needs LCSW gave client LCSW phone number of [phone] and encouraged her to call LCSW as needed for SW support LCSW thanked client for phone call with LCSW today         Client to call LCSW at [phone] as able to discuss client SW needs at that time   Please call the care guide team at [phone] if you need to cancel or reschedule your appointment.   If you are experiencing a Mental Health or Behavioral Health Crisis or need someone to talk to, please go to [facility] [address] [place] [phone])   The patient verbalized understanding of instructions, educational materials, and care plan provided today and DECLINED offer to receive copy of patient instructions, educational materials, and care plan.   The patient has been provided with contact information for the care management team and has been advised to call with any health  related questions or concerns.   Kelton Pillar.Bruce Mayers MSW, LCSW Licensed Visual merchandiser [facility] Care Management [phone]

## 2023-03-27 ENCOUNTER — Encounter: Payer: Self-pay | Admitting: Family Medicine

## 2023-03-27 ENCOUNTER — Other Ambulatory Visit: Payer: Self-pay

## 2023-03-27 DIAGNOSIS — F411 Generalized anxiety disorder: Secondary | ICD-10-CM | POA: Diagnosis not present

## 2023-03-27 MED ORDER — BUSPIRONE HCL 10 MG PO TABS: 10.0000 mg | ORAL_TABLET | Freq: Three times a day (TID) | ORAL | 2 refills | Status: DC

## 2023-03-28 ENCOUNTER — Other Ambulatory Visit: Payer: PPO

## 2023-04-01 ENCOUNTER — Other Ambulatory Visit: Payer: Self-pay

## 2023-04-01 ENCOUNTER — Other Ambulatory Visit: Payer: PPO

## 2023-04-01 NOTE — Patient Instructions (Signed)
Visit Information  Thank you for taking time to visit with me today. Please don't hesitate to contact me if I can be of assistance to you before our next scheduled telephone appointment.  Following are the goals we discussed today:   Goals Addressed             This Visit's Progress    RNCM Care Management Expected Outcome:  Monitor, Self-Manage and Reduce Symptoms of Diabetes       Current Barriers:  Chronic Disease Management support and education needs related to effective management of DM Lab Results  Component Value Date   HGBA1C 8.5 (H) 02/13/2023  At endocrinologist on 01-11-2023 A1C is 8.1%   Planned Interventions: Provided education to patient about basic DM disease process. The patient is working with the endocrinologist on getting her blood sugars stable. The patient has been under a lot of stress and has had more hospitalizations. Continues to work with endocrinologist on a regular basis. Reviewed medications with patient and discussed importance of medication adherence. Patient states compliance with medications. The patient has her medications she will have an appointment with the pharm D next week to get her medications changed over for delivery from [facility] to [facility]. Denies any new concerns at this time related to medication needs.  Reviewed prescribed diet with patient heart healthy/ADA diet. Is compliant of heart healthy/ADA diet. The patient is mindful of what she eats and following dietary restrictions  ; Counseled on importance of regular laboratory monitoring as prescribed. Has regular lab work done. Review of A1C levels. The patient has an A1C level higher than normal. The patient knows and is continuing to work with the endocrinologist and her providers to get her A1C in range.  Discussed plans with patient for ongoing care management follow up and provided patient with direct contact information for care management team;      Provided patient with  written educational materials related to hypo and hyperglycemia and importance of correct treatment. She is having some fluctuations in her blood sugars. States she is monitoring her blood sugars. Has a continuous glucose reader.;      Reviewed scheduled/upcoming provider appointments including: 04-04-2023 at 1040 am Advised patient, providing education and rationale, to check cbg twice daily and when you have symptoms of low or high blood sugar and record. Has a continuous reader. Her average has been around 186. Has had changes in her medications. Since new sensor placed the readings have been up and down. Review of the cause may be the sensor. She is going to try and change out sensors. Education provided.       call provider for findings outside established parameters;       Referral made to community resources care guide team for assistance with food insecurities and resources in her area to help with expressed needs. Has talked with care guides and had resources mailed.  ;      Review of patient status, including review of consultants reports, relevant laboratory and other test results, and medications completed;       Advised patient to discuss changes in DM, questions, and concerns with provider;      Screening for signs and symptoms of depression related to chronic disease state;        Assessed social determinant of health barriers;         Symptom Management: Take medications as prescribed   Attend all scheduled provider appointments Call provider office for new concerns or  questions  call the Suicide and Crisis Lifeline: 988 call the [place] National Suicide Prevention Lifeline: (640) 646-2848 or TTY: (703)818-2066 TTY (419) 035-3739) to talk to a trained counselor call 1-800-273-TALK (toll free, 24 hour hotline) if experiencing a Mental Health or Behavioral Health Crisis  keep appointment with eye doctor check feet daily for cuts, sores or redness trim toenails straight across manage  portion size wash and dry feet carefully every day wear comfortable, cotton socks wear comfortable, well-fitting shoes  Follow Up Plan: Telephone follow up appointment with care management team member scheduled for: 05-13-2023 at 345 pm       RNCM Care Management Expected Outcome:  Monitor, Self-Manage and Reduce Symptoms of: Depression, Anxiety, Stress       Current Barriers:  Knowledge Deficits related to resources available to help with expressed needs at this time due to her husband losing his job and his unemployment has expired Care Coordination needs related to resources to help meet financial needs at this time in a patient with stress, anxiety, and depression  Chronic Disease Management support and education needs related to effective management of stress, anxiety, and depression Financial Constraints.   Planned Interventions: Evaluation of current treatment plan related to depression, stress, and anxiety and patient's adherence to plan as established by provider. The patient is doing good. Has been a little stressed, her mother is dealing with a cancer diagnosis for the 3rd time and yesterday they found out that her mother in law has pancreatic cancer. Her mother is now at a facility in [facility] for rehab and she is driving there everyday and staying with her mother during the day. She says she is doing much better since she is not being over-medicated and had a fall at the other facility that she was at. The patient has been in the hospital again post nstemi late summer but is doing well today. She denies any cp at this time. Sees the providers on a regular basis. Will continue to monitor for changes and new needs. Discussed self care and making sure her needs are being met.  Reviewed medications with patient and discussed compliance. The patient is compliant with medications. The patient has her medications and has a follow up planned for next week with pharm D to get her medications  delivered from [facility].   Provided patient with care guide and SW support as well as support from the interdisciplinary team for management of stress, anxiety, and depression  educational materials related to effective management of stress, anxiety, and depression. The patient is actively working with the LCSW for ongoing support and education. Reviewed scheduled/upcoming provider appointments including 04-04-2023 at 1040 am Care Guide referral for community resources for [facility] for food insecurities and assistance with utilities. Has information from the care guides  Social Work referral for education and support and resources for effective management of stress, anxiety, and depression. The patient continues to work with the SW for assistance, education, and support.  Discussed plans with patient for ongoing care management follow up and provided patient with direct contact information for care management team Advised patient to discuss changes in mood, increased anxiety, depression, stress, or other mental health needs.  with provider Screening for signs and symptoms of depression related to chronic disease state  Assessed social determinant of health barriers  Symptom Management: Take medications as prescribed   Attend all scheduled provider appointments Call provider office for new concerns or questions  call the Suicide and Crisis Lifeline: 988 call  the [place] National Suicide Prevention Lifeline: 831-334-7195 or TTY: (503)499-1224 TTY 217-859-5307) to talk to a trained counselor call 1-800-273-TALK (toll free, 24 hour hotline) if experiencing a Mental Health or Behavioral Health Crisis   Follow Up Plan: Telephone follow up appointment with care management team member scheduled for: 05-13-2023 at 345 pm       RNCM Care Management Expected Outcome:  Monitor, Self-Manage, and Reduce Symptoms of Hypertension       Current Barriers:  Chronic Disease Management support and education needs  related to effective management of HTN BP Readings from Last 3 Encounters:  02/27/23 110/62  02/20/23 (!) 127/45  12/25/22 122/64   Wt Readings from Last 3 Encounters:  02/27/23 224 lb 12.8 oz (102 kg)  02/20/23 226 lb 13.7 oz (102.9 kg)  12/25/22 229 lb (103.9 kg)      Planned Interventions: Evaluation of current treatment plan related to hypertension self management and patient's adherence to plan as established by provider. The patient states her blood pressures have been fluctuating some. She does take at home and records. Sees specialist on a regular basis. She is watching her fluid balance and her first indicator that she has is her wedding band being tight on her finger. Usually when she weights she has gain 0.5 pounds over night, She works with the cardiologist on a regular basis for fluid balance and she also is back in cardiac rehab 3 times a week She was in the hospital late summer with a NSTEMI. She is doing better now. Denies any CP at this time. Education and support provided.  Provided education to patient re: stroke prevention, s/s of heart attack and stroke. The patient has high risk factors for HA and stroke. Had 2 heart attacks in 2022. States that she had taken NTG with minimal relief. Denies any new concerns with CP at this time.. Reflective listening. Praised for being proactive in her care and seeking help; Reviewed prescribed diet heart healthy/ADA diet. Review and education  Reviewed medications with patient and discussed importance of compliance. The patient states compliance with medications. Denies any medication needs at this time. Will talk with the pharm D next week for help with home delivery of medications. ;  Discussed plans with patient for ongoing care management follow up and provided patient with direct contact information for care management team; Advised patient, providing education and rationale, to monitor blood pressure daily and record, calling PCP for  findings outside established parameters. The patient states her blood pressures systolic are around 120's and diastolic about 63 to 66;  Reviewed scheduled/upcoming provider appointments including: 04-04-2023 at 1040 am Advised patient to discuss changes in her blood pressures or heart health  with provider; Provided education on prescribed diet heart healthy/ADA diet.  Discussed complications of poorly controlled blood pressure such as heart disease, stroke, circulatory complications, vision complications, kidney impairment, sexual dysfunction;  Screening for signs and symptoms of depression related to chronic disease state;  Assessed social determinant of health barriers;   Symptom Management: Take medications as prescribed   Attend all scheduled provider appointments Call provider office for new concerns or questions  call the Suicide and Crisis Lifeline: 988 call the [place] National Suicide Prevention Lifeline: (814)470-6845 or TTY: 418-688-9953 TTY (857)868-3325) to talk to a trained counselor call 1-800-273-TALK (toll free, 24 hour hotline) if experiencing a Mental Health or Behavioral Health Crisis  check blood pressure 3 times per week write blood pressure results in a log or diary learn about high  blood pressure keep a blood pressure log take blood pressure log to all doctor appointments call doctor for signs and symptoms of high blood pressure develop an action plan for high blood pressure keep all doctor appointments take medications for blood pressure exactly as prescribed report new symptoms to your doctor  Follow Up Plan: Telephone follow up appointment with care management team member scheduled for: 05-13-2023 at 345 pm           Our next appointment is by telephone on 05-13-2023 at 345 pm  Please call the care guide team at [phone] if you need to cancel or reschedule your appointment.   If you are experiencing a Mental Health or Behavioral Health Crisis or need  someone to talk to, please call the Suicide and Crisis Lifeline: 988 call the [place] National Suicide Prevention Lifeline: 506-280-9421 or TTY: 478 458 9350 TTY 2563422154) to talk to a trained counselor call 1-800-273-TALK (toll free, 24 hour hotline) go to [facility] [address], [place] [phone])   Patient verbalizes understanding of instructions and care plan provided today and agrees to view in MyChart. Active MyChart status and patient understanding of how to access instructions and care plan via MyChart confirmed with patient.       Alto Denver RN, MSN, CCM RN Care Manager  [facility]  Ambulatory Care Management  Direct Number: [phone]

## 2023-04-01 NOTE — Patient Outreach (Signed)
Care Management   Visit Note  04/01/2023 Name: Dana [name] MRN: [id number] DOB: Jun 20, 1961  Subjective: Dana [name] is a 62 y.o. year old female who is a primary care patient of Cox, Kirsten, MD. The Care Management team was consulted for assistance.      Engaged with patient spoke with patient by telephone.    Goals Addressed             This Visit's Progress    RNCM Care Management Expected Outcome:  Monitor, Self-Manage and Reduce Symptoms of Diabetes       Current Barriers:  Chronic Disease Management support and education needs related to effective management of DM Lab Results  Component Value Date   HGBA1C 8.5 (H) 02/13/2023  At endocrinologist on 01-11-2023 A1C is 8.1%   Planned Interventions: Provided education to patient about basic DM disease process. The patient is working with the endocrinologist on getting her blood sugars stable. The patient has been under a lot of stress and has had more hospitalizations. Continues to work with endocrinologist on a regular basis. Reviewed medications with patient and discussed importance of medication adherence. Patient states compliance with medications. The patient has her medications she will have an appointment with the pharm D next week to get her medications changed over for delivery from [facility] to [facility]. Denies any new concerns at this time related to medication needs.  Reviewed prescribed diet with patient heart healthy/ADA diet. Is compliant of heart healthy/ADA diet. The patient is mindful of what she eats and following dietary restrictions  ; Counseled on importance of regular laboratory monitoring as prescribed. Has regular lab work done. Review of A1C levels. The patient has an A1C level higher than normal. The patient knows and is continuing to work with the endocrinologist and her providers to get her A1C in range.  Discussed plans with patient for ongoing care management follow up and provided  patient with direct contact information for care management team;      Provided patient with written educational materials related to hypo and hyperglycemia and importance of correct treatment. She is having some fluctuations in her blood sugars. States she is monitoring her blood sugars. Has a continuous glucose reader.;      Reviewed scheduled/upcoming provider appointments including: 04-04-2023 at 1040 am Advised patient, providing education and rationale, to check cbg twice daily and when you have symptoms of low or high blood sugar and record. Has a continuous reader. Her average has been around 186. Has had changes in her medications. Since new sensor placed the readings have been up and down. Review of the cause may be the sensor. She is going to try and change out sensors. Education provided.       call provider for findings outside established parameters;       Referral made to community resources care guide team for assistance with food insecurities and resources in her area to help with expressed needs. Has talked with care guides and had resources mailed.  ;      Review of patient status, including review of consultants reports, relevant laboratory and other test results, and medications completed;       Advised patient to discuss changes in DM, questions, and concerns with provider;      Screening for signs and symptoms of depression related to chronic disease state;        Assessed social determinant of health barriers;         Symptom  Management: Take medications as prescribed   Attend all scheduled provider appointments Call provider office for new concerns or questions  call the Suicide and Crisis Lifeline: 988 call the [place] National Suicide Prevention Lifeline: 615-505-1715 or TTY: 440-321-3142 TTY 629-212-9952) to talk to a trained counselor call 1-800-273-TALK (toll free, 24 hour hotline) if experiencing a Mental Health or Behavioral Health Crisis  keep appointment with eye  doctor check feet daily for cuts, sores or redness trim toenails straight across manage portion size wash and dry feet carefully every day wear comfortable, cotton socks wear comfortable, well-fitting shoes  Follow Up Plan: Telephone follow up appointment with care management team member scheduled for: 05-13-2023 at 345 pm       RNCM Care Management Expected Outcome:  Monitor, Self-Manage and Reduce Symptoms of: Depression, Anxiety, Stress       Current Barriers:  Knowledge Deficits related to resources available to help with expressed needs at this time due to her husband losing his job and his unemployment has expired Care Coordination needs related to resources to help meet financial needs at this time in a patient with stress, anxiety, and depression  Chronic Disease Management support and education needs related to effective management of stress, anxiety, and depression Financial Constraints.   Planned Interventions: Evaluation of current treatment plan related to depression, stress, and anxiety and patient's adherence to plan as established by provider. The patient is doing good. Has been a little stressed, her mother is dealing with a cancer diagnosis for the 3rd time and yesterday they found out that her mother in law has pancreatic cancer. Her mother is now at a facility in [facility] for rehab and she is driving there everyday and staying with her mother during the day. She says she is doing much better since she is not being over-medicated and had a fall at the other facility that she was at. The patient has been in the hospital again post nstemi late summer but is doing well today. She denies any cp at this time. Sees the providers on a regular basis. Will continue to monitor for changes and new needs. Discussed self care and making sure her needs are being met.  Reviewed medications with patient and discussed compliance. The patient is compliant with medications. The patient has her  medications and has a follow up planned for next week with pharm D to get her medications delivered from [facility]   Provided patient with care guide and SW support as well as support from the interdisciplinary team for management of stress, anxiety, and depression  educational materials related to effective management of stress, anxiety, and depression. The patient is actively working with the LCSW for ongoing support and education. Reviewed scheduled/upcoming provider appointments including 04-04-2023 at 1040 am Care Guide referral for community resources for [facility] for food insecurities and assistance with utilities. Has information from the care guides  Social Work referral for education and support and resources for effective management of stress, anxiety, and depression. The patient continues to work with the SW for assistance, education, and support.  Discussed plans with patient for ongoing care management follow up and provided patient with direct contact information for care management team Advised patient to discuss changes in mood, increased anxiety, depression, stress, or other mental health needs.  with provider Screening for signs and symptoms of depression related to chronic disease state  Assessed social determinant of health barriers  Symptom Management: Take medications as prescribed   Attend all scheduled  provider appointments Call provider office for new concerns or questions  call the Suicide and Crisis Lifeline: 988 call the [place] National Suicide Prevention Lifeline: 2085204053 or TTY: (204)557-5859 TTY (610)496-2078) to talk to a trained counselor call 1-800-273-TALK (toll free, 24 hour hotline) if experiencing a Mental Health or Behavioral Health Crisis   Follow Up Plan: Telephone follow up appointment with care management team member scheduled for: 05-13-2023 at 345 pm       RNCM Care Management Expected Outcome:  Monitor, Self-Manage, and Reduce Symptoms of  Hypertension       Current Barriers:  Chronic Disease Management support and education needs related to effective management of HTN BP Readings from Last 3 Encounters:  02/27/23 110/62  02/20/23 (!) 127/45  12/25/22 122/64   Wt Readings from Last 3 Encounters:  02/27/23 224 lb 12.8 oz (102 kg)  02/20/23 226 lb 13.7 oz (102.9 kg)  12/25/22 229 lb (103.9 kg)      Planned Interventions: Evaluation of current treatment plan related to hypertension self management and patient's adherence to plan as established by provider. The patient states her blood pressures have been fluctuating some. She does take at home and records. Sees specialist on a regular basis. She is watching her fluid balance and her first indicator that she has is her wedding band being tight on her finger. Usually when she weights she has gain 0.5 pounds over night, She works with the cardiologist on a regular basis for fluid balance and she also is back in cardiac rehab 3 times a week She was in the hospital late summer with a NSTEMI. She is doing better now. Denies any CP at this time. Education and support provided.  Provided education to patient re: stroke prevention, s/s of heart attack and stroke. The patient has high risk factors for HA and stroke. Had 2 heart attacks in 2022. States that she had taken NTG with minimal relief. Denies any new concerns with CP at this time.. Reflective listening. Praised for being proactive in her care and seeking help; Reviewed prescribed diet heart healthy/ADA diet. Review and education  Reviewed medications with patient and discussed importance of compliance. The patient states compliance with medications. Denies any medication needs at this time. Will talk with the pharm D next week for help with home delivery of medications. ;  Discussed plans with patient for ongoing care management follow up and provided patient with direct contact information for care management team; Advised patient,  providing education and rationale, to monitor blood pressure daily and record, calling PCP for findings outside established parameters. The patient states her blood pressures systolic are around 120's and diastolic about 63 to 66;  Reviewed scheduled/upcoming provider appointments including: 04-04-2023 at 1040 am Advised patient to discuss changes in her blood pressures or heart health  with provider; Provided education on prescribed diet heart healthy/ADA diet.  Discussed complications of poorly controlled blood pressure such as heart disease, stroke, circulatory complications, vision complications, kidney impairment, sexual dysfunction;  Screening for signs and symptoms of depression related to chronic disease state;  Assessed social determinant of health barriers;   Symptom Management: Take medications as prescribed   Attend all scheduled provider appointments Call provider office for new concerns or questions  call the Suicide and Crisis Lifeline: 988 call the [place] National Suicide Prevention Lifeline: 952-867-5474 or TTY: 343-439-8526 TTY 631-085-1885) to talk to a trained counselor call 1-800-273-TALK (toll free, 24 hour hotline) if experiencing a Mental Health or Behavioral Health Crisis  check blood pressure 3 times per week write blood pressure results in a log or diary learn about high blood pressure keep a blood pressure log take blood pressure log to all doctor appointments call doctor for signs and symptoms of high blood pressure develop an action plan for high blood pressure keep all doctor appointments take medications for blood pressure exactly as prescribed report new symptoms to your doctor  Follow Up Plan: Telephone follow up appointment with care management team member scheduled for: 05-13-2023 at 345 pm           Consent to Services:  Patient was given information about care management services, agreed to services, and gave verbal consent to participate.    Plan: Telephone follow up appointment with care management team member scheduled for: 05-13-2023 at 345 pm  Alto Denver RN, MSN, CCM RN Care Manager  [facility]  Ambulatory Care Management  Direct Number: [phone]

## 2023-04-02 DIAGNOSIS — F411 Generalized anxiety disorder: Secondary | ICD-10-CM | POA: Diagnosis not present

## 2023-04-03 ENCOUNTER — Encounter: Payer: Self-pay | Admitting: Family Medicine

## 2023-04-03 NOTE — Assessment & Plan Note (Signed)
The current medical regimen is effective;  continue present plan and medications. Continue protonix 40 mg daily.

## 2023-04-03 NOTE — Assessment & Plan Note (Signed)
Management per specialist.  Continue carvedilol 12.5 mg twice daily, torsemide 40 mg daily, Repatha 140 mg SQ every 2 weeks, Zetia 10 mg p.o. daily, aspirin 81 mg daily Brilinta 90 mg twice daily.

## 2023-04-03 NOTE — Progress Notes (Signed)
Subjective:  Patient ID: Dana [name], female    DOB: [birth year]  Age: 62 y.o. MRN: [id number]  Chief Complaint  Patient presents with   Medical Management of Chronic Issues    HPI Diabetes: Managed by Dr. Katrinka Blazing Complications: neuropathy.  Glucose checking:free style libre  Glucose logs:blood sugar this morning was 109.  Most recent A1C: 8.5% (02/13/2023) Current medications: Levemir 50-55 units daily, Novolog 2-10 units on sliding scale before meals.  Last Eye Exam: 3/24 Foot checks: daily   Hyperlipidemia: Current medications: Repatha 140 mg every other week, Zetia 10 mg daily, Fish oil 1000 mg daily.    Depression/GAD: Buspar 10 mg three times a day and Lorazepam 0.5 mg once daily,    Hypertension: Complications: chf, cad, ckd Current medications: Carvedilol 12.5 mg two times daily, Imdur 60 mg daily, Torsemide 80 mg daily, Potassium 20 meq once daily, Brilanta 90 mg TWICE DAILY. See's Dr[facility] Patrica [facility]uel [facility]. Weighs daily. Weight stable.    Hypomagnesium: Mag oxide 400 mg 2 daily.    GOUT: Allopurinol 300 mg once daily.   GERD: Protonix 40 mg once daily.    B12 deficiency:  B12 gummies 1200 mg daily.   Chronic back pain: on hydrocodone/apap 7.5/325 mg three times a day as needed. Takes it twice a day and sometimes will take a third.   Diet: healthy Exercise: chair   Chronic respiratory failure with hypoxia and OSA: On cpap and 2 L oxygen at night.     04/04/2023   10:52 AM 03/26/2023    3:03 PM 02/05/2023    3:34 PM 01/22/2023    9:42 AM 12/17/2022    3:41 PM  Depression screen PHQ 2/9  Decreased Interest 0 1 1 1  0  Down, Depressed, Hopeless 2 1 1 1 1   PHQ - 2 Score 2 2 2 2 1   Altered sleeping 3 1 1 1 3   Tired, decreased energy 3 1 1 1 3   Change in appetite 3 1 1 1 1   Feeling bad or failure about yourself  2 0 0 0 0  Trouble concentrating 2 0 0 0 0  Moving slowly or fidgety/restless 1 1 1 1  0  Suicidal thoughts 0 0 0 0 0  PHQ-9  Score 16 6 6 6 8   Difficult doing work/chores Somewhat difficult Somewhat difficult Somewhat difficult Somewhat difficult Somewhat difficult        04/04/2023   10:51 AM  Fall Risk   Falls in the past year? 1  Number falls in past yr: 1  Injury with Fall? 1  Risk for fall due to : Impaired balance/gait;Impaired mobility  Follow up Falls evaluation completed;Falls prevention discussed    Patient Care Team: Blane Ohara, MD as PCP - General (Family Medicine) Bensimhon, Bevelyn Buckles, MD as PCP - Cardiology (Cardiology) Marcelyn Bruins, MD as Consulting Physician (Allergy) Bensimhon, Bevelyn Buckles, MD as Consulting Physician (Cardiology) Beverly Gust, MD as Referring Physician (Gastroenterology) Genia Del, Daisy Blossom, MD as Consulting Physician (Ophthalmology) Doristine Bosworth., MD as Consulting Physician (Endocrinology) Margarette Canada., DPM as Consulting Physician (Podiatry) Donnetta Hail, MD as Consulting Physician (Rheumatology) Barnabas Lister, MD as Referring Physician (Nephrology) Orbie Pyo, MD as Consulting Physician (Cardiology) Dellia Beckwith, MD as Consulting Physician (Oncology) Marisue Brooklyn, MD as Referring Physician Doristine Bosworth., MD (Endocrinology) Marlowe Sax, RN as Case Manager (General Practice) Randa Spike, Kelton Pillar, LCSW as [facility] Care Management (Licensed Clinical Social  Worker)   Review of Systems  Constitutional:  Negative for chills, fatigue and fever.  HENT:  Negative for congestion, ear pain and sore throat.   Respiratory:  Negative for cough and shortness of breath.   Cardiovascular:  Negative for chest pain.  Gastrointestinal:  Negative for abdominal pain, constipation, diarrhea, nausea and vomiting.  Genitourinary:  Negative for dysuria and urgency.  Musculoskeletal:  Negative for arthralgias and myalgias.  Skin:  Negative for rash.  Neurological:  Negative for dizziness and headaches.   Psychiatric/Behavioral:  Negative for dysphoric mood. The patient is not nervous/anxious.     Current Outpatient Medications on File Prior to Visit  Medication Sig Dispense Refill   allopurinol (ZYLOPRIM) 300 MG tablet TAKE ONE TABLET BY MOUTH ONCE DAILY 90 tablet 0   aspirin EC 81 MG tablet Take 1 tablet (81 mg total) by mouth daily. Swallow whole. 30 tablet 12   busPIRone (BUSPAR) 10 MG tablet Take 1 tablet (10 mg total) by mouth 3 (three) times daily. 90 tablet 2   carvedilol (COREG) 12.5 MG tablet TAKE ONE TABLET BY MOUTH twice daily 180 tablet 3   Continuous Blood Gluc Sensor (FREESTYLE LIBRE 2 SENSOR) MISC USE TO check blood glucose AS DIRECTED AND CHANGE sensor every 14 DAYS     CRANBERRY PO Take 2 tablets by mouth daily.     EPINEPHrine 0.3 mg/0.3 mL IJ SOAJ injection Use as directed for life-threatening allergic reaction. 4 Device 3   ezetimibe (ZETIA) 10 MG tablet Take 1 tablet (10 mg total) by mouth daily. (Patient taking differently: Take 10 mg by mouth every evening.) 90 tablet 1   HYDROcodone-acetaminophen (NORCO) 7.5-325 MG tablet Take 1 tablet by mouth 3 (three) times daily as needed. 90 tablet 0   insulin aspart (NOVOLOG FLEXPEN) 100 UNIT/ML FlexPen 2-10 U before meals based on SSI. 15 mL 3   Insulin Pen Needle (BD PEN NEEDLE NANO 2ND GEN) 32G X 4 MM MISC Inject 1 each into the skin in the morning, at noon, in the evening, and at bedtime. 600 each 3   latanoprost (XALATAN) 0.005 % ophthalmic solution Place 1 drop into both eyes at bedtime.     LEVEMIR FLEXPEN 100 UNIT/ML FlexPen Inject 50 units into THE SKIN daily (Patient taking differently: Inject 55 Units into the skin daily.) 15 mL 1   linaclotide (LINZESS) 145 MCG CAPS capsule TAKE ONE CAPSULE BY MOUTH BEFORE BREAKFAST 90 capsule 0   LORazepam (ATIVAN) 0.5 MG tablet TAKE ONE TABLET BY MOUTH ONCE daily AS NEEDED FOR ANXIETY 30 tablet 2   magnesium oxide (MAG-OX) 400 MG tablet Take 1 tablet (400 mg total) by mouth daily.  Take 2 (400 mg) daily=800 mg (Patient taking differently: Take 800 mg by mouth daily. Take 2 (400 mg) daily=800 mg) 60 tablet 1   nitroGLYCERIN (NITROSTAT) 0.4 MG SL tablet DISSOLVE ONE TABLET UNDER THE TONGUE EVERY 5 MINUTES AS NEEDED FOR CHEST PAIN.  DO NOT EXCEED A TOTAL OF 3 DOSES IN 15 MINUTES 25 tablet 0   Omega-3 Fatty Acids (FISH OIL PO) Take 1 capsule by mouth daily.     OXYGEN Inhale 2 L into the lungs at bedtime.     pantoprazole (PROTONIX) 40 MG tablet TAKE ONE TABLET BY MOUTH ONCE DAILY 90 tablet 0   polyethylene glycol (MIRALAX / GLYCOLAX) 17 g packet Take 17 g by mouth daily. 14 each 0   potassium chloride SA (KLOR-CON M) 20 MEQ tablet TAKE ONE TABLET BY MOUTH ONCE  DAILY (Patient taking differently: Take 20 mEq by mouth 2 (two) times a week.) 90 tablet 0   Probiotic CAPS Take 1 capsule by mouth daily.     promethazine (PHENERGAN) 25 MG tablet TAKE ONE TABLET BY MOUTH DAILY AS NEEDED 30 tablet 3   REPATHA SURECLICK 140 MG/ML SOAJ inject 140mg  intot HEART SKIN every 14 DAYS 6 mL 1   ticagrelor (BRILINTA) 90 MG TABS tablet Take 1 tablet (90 mg total) by mouth 2 (two) times daily. 60 tablet 6   torsemide 40 MG TABS Take 40 mg by mouth daily. (Patient taking differently: Take 60 mg by mouth daily.) 30 tablet 0   No current facility-administered medications on file prior to visit.   Past Medical History:  Diagnosis Date   Acquired absence of left great toe (HCC)    Acquired absence of other left toe(s) (HCC)    ACS (acute coronary syndrome) (HCC)    Anemia    Anoxic brain injury (HCC)    Anxiety    CAD (coronary artery disease)    DES mLAD 04/07/15; DES dRCA & DES pPDA 08/30/16; DES mCX & PTCA OM1 09/29/20   Chronic diastolic (congestive) heart failure (HCC)    Chronic pain    Chronic systolic (congestive) heart failure (HCC)    CKD (chronic kidney disease)    Contrast dye induced nephropathy, possible 09/03/2020   COPD (chronic obstructive pulmonary disease) (HCC)    COVID-19  virus infection 03/03/2023   Diabetes (HCC)type 1    Diastolic CHF (HCC)    Dyspnea    Family history of breast cancer 10/23/2021   Gastro-esophageal reflux disease without esophagitis    Hyperlipidemia    Hypertension    Hypertensive heart disease with heart failure (HCC)    Hypoxemia    Idiopathic gout, multiple sites    Leukocytosis 03/29/2022   Localized edema    Low back pain    Mitral regurgitation    Mitral regurgitation    Mixed hyperlipidemia    Mixed incontinence    Morbid (severe) obesity due to excess calories (HCC)    Neuromuscular disorder (HCC)    Neuropathy    Non-ST elevation (NSTEMI) myocardial infarction (HCC)    08/29/20, 09/29/20   OSA (obstructive sleep apnea) 09/03/2020   Other specified hypothyroidism    PAF (paroxysmal atrial fibrillation) (HCC)    s/p pulmonary vein isolation by cryoablation 10/04/15 Dr. Rudolpho Sevin in [facility]   PEA (Pulseless electrical activity) [facility])    ~ 01/13/21 at [facility] during admission DKA, volume overload, possible CAP, hypoxia s/p ACLS with ROSC ~ 10-15   Pneumonia    Primary insomnia    Rheumatoid arthritis (HCC)    Stroke (HCC)    Thyroid disease    Type 1 diabetes mellitus (HCC)    Vitamin D deficiency    Past Surgical History:  Procedure Laterality Date   ABDOMINAL HYSTERECTOMY     Partial- Still has both ovaries   CARDIOVASCULAR STRESS TEST  08/13/2014   Nuclear; Normal   CARPAL TUNNEL RELEASE     CATARACT EXTRACTION, BILATERAL     CESAREAN SECTION     CHOLECYSTECTOMY     CORONARY ANGIOPLASTY WITH STENT PLACEMENT     3 blockages/ 1 stent   CORONARY PRESSURE/FFR STUDY N/A 09/07/2022   Procedure: INTRAVASCULAR PRESSURE WIRE/FFR STUDY;  Surgeon: Swaziland, Peter M, MD;  Location: [facility] INVASIVE CV LAB;  Service: Cardiovascular;  Laterality: N/A;   CORONARY STENT INTERVENTION N/A 09/29/2020   Procedure:  CORONARY STENT INTERVENTION;  Surgeon: Tonny Bollman, MD;  Location: [facility] INVASIVE CV LAB;  Service: Cardiovascular;   Laterality: N/A;  CFX   CORONARY STENT INTERVENTION N/A 09/07/2022   Procedure: CORONARY STENT INTERVENTION;  Surgeon: Swaziland, Peter M, MD;  Location: [facility] INVASIVE CV LAB;  Service: Cardiovascular;  Laterality: N/A;   CORONARY STENT INTERVENTION N/A 02/13/2023   Procedure: CORONARY STENT INTERVENTION;  Surgeon: Kathleene Hazel, MD;  Location: [facility] LAB;  Service: Cardiovascular;  Laterality: N/A;  LAD   CORONARY ULTRASOUND/IVUS N/A 02/13/2023   Procedure: Coronary Ultrasound/IVUS;  Surgeon: Kathleene Hazel, MD;  Location: [facility]AS[facility] CV LAB;  Service: Cardiovascular;  Laterality: N/A;   CORONARY/GRAFT ACUTE MI REVASCULARIZATION N/A 12/08/2022   Procedure: Coronary/Graft Acute MI Revascularization;  Surgeon: Marykay Lex, MD;  Location: [facility] INVASIVE CV LAB;  Service: Cardiovascular;  Laterality: N/A;   EYE SURGERY     LEFT HEART CATH AND CORONARY ANGIOGRAPHY N/A 09/29/2020   Procedure: LEFT HEART CATH AND CORONARY ANGIOGRAPHY;  Surgeon: Tonny Bollman, MD;  Location: [facility] INVASIVE CV LAB;  Service: Cardiovascular;  Laterality: N/A;   LEFT HEART CATH AND CORONARY ANGIOGRAPHY N/A 09/07/2022   Procedure: LEFT HEART CATH AND CORONARY ANGIOGRAPHY;  Surgeon: Swaziland, Peter M, MD;  Location: [facility] INVASIVE CV LAB;  Service: Cardiovascular;  Laterality: N/A;   LEFT HEART CATH AND CORONARY ANGIOGRAPHY N/A 12/08/2022   Procedure: LEFT HEART CATH AND CORONARY ANGIOGRAPHY;  Surgeon: Marykay Lex, MD;  Location: [facility] INVASIVE CV LAB;  Service: Cardiovascular;  Laterality: N/A;   LEFT HEART CATH AND CORONARY ANGIOGRAPHY N/A 02/13/2023   Procedure: LEFT HEART CATH AND CORONARY ANGIOGRAPHY;  Surgeon: Kathleene Hazel, MD;  Location: [facility] INVASIVE CV LAB;  Service: Cardiovascular;  Laterality: N/A;   MITRAL VALVE REPAIR N/A 05/18/2021   Procedure: MITRAL VALVE REPAIR;  Surgeon: Orbie Pyo, MD;  Location: [facility] LAB;  Service: Cardiovascular;  Laterality: N/A;   RIGHT HEART CATH  N/A 04/27/2021   Procedure: RIGHT HEART CATH;  Surgeon: Dolores Patty, MD;  Location: [facility];  Service: Cardiovascular;  Laterality: N/A;   RIGHT/LEFT HEART CATH AND CORONARY ANGIOGRAPHY N/A 01/07/2017   Procedure: Right/Left Heart Cath and Coronary Angiography;  Surgeon: Laurey Morale, MD;  Location: [facility] INVASIVE CV LAB;  Service: Cardiovascular;  Laterality: N/A;   ROTATOR CUFF REPAIR     TEE WITHOUT CARDIOVERSION N/A 04/28/2021   Procedure: TRANSESOPHAGEAL ECHOCARDIOGRAM (TEE);  Surgeon: Dolores Patty, MD;  Location: [facility] ENDOSCOPY;  Service: Cardiovascular;  Laterality: N/A;   TEE WITHOUT CARDIOVERSION N/A 05/18/2021   Procedure: TRANSESOPHAGEAL ECHOCARDIOGRAM (TEE);  Surgeon: Orbie Pyo, MD;  Location: [facility] INVASIVE CV LAB;  Service: Cardiovascular;  Laterality: N/A;   TOE AMPUTATION Left    US ECHOCARDIOGRAPHY  05/2017   Normal    Family History  Problem Relation Age of Onset   Breast cancer Mother 34       contralateral breast ca dx 5   Coronary artery disease Mother    Hypertension Mother    Hyperlipidemia Mother    Diabetes type II Mother    Lung cancer Mother 67   Diabetes Father    Hypertension Father    Mental illness Sister    Bipolar disorder Other    Depression Other    Schizophrenia Other    Cancer Other        MGF's sisters x2; unknown type   Social History   Socioeconomic History  Marital status: Married    Spouse name: [name]   Number of children: 2   Years of education: 16   Highest education level: Master's degree (e.g., MA, MS, MEng, MEd, MSW, MBA)  Occupational History   Occupation: on disability/retired early former Engineer, site  Tobacco Use   Smoking status: Former    Current packs/day: 0.00    Types: Cigarettes    Start date: 81    Quit date: 1984    Years since quitting: 40.7    Passive exposure: Past   Smokeless tobacco: Never  Vaping Use   Vaping status: Never Used  Substance and Sexual Activity   Alcohol  use: Never   Drug use: Never   Sexual activity: Not on file  Other Topics Concern   Not on file  Social History Narrative   Not on file   Social Determinants of Health   Financial Resource Strain: Medium Risk (10/30/2022)   Overall Financial Resource Strain (CARDIA)    Difficulty of Paying Living Expenses: Somewhat hard  Food Insecurity: No Food Insecurity (02/13/2023)   Hunger Vital Sign    Worried About Running Out of Food in the Last Year: Never true    Ran Out of Food in the Last Year: Never true  Transportation Needs: No Transportation Needs (02/13/2023)   PRAPARE - Administrator, Civil Service (Medical): No    Lack of Transportation (Non-Medical): No  Physical Activity: Inactive (03/26/2023)   Exercise Vital Sign    Days of Exercise per Week: 0 days    Minutes of Exercise per Session: 0 min  Stress: Stress Concern Present (03/26/2023)   [employer] of Occupational Health - Occupational Stress Questionnaire    Feeling of Stress : To some extent  Social Connections: Moderately Integrated (07/25/2022)   Social Connection and Isolation Panel [NHANES]    Frequency of Communication with Friends and Family: More than three times a week    Frequency of Social Gatherings with Friends and Family: More than three times a week    Attends Religious Services: More than 4 times per year    Active Member of [employer] or Organizations: No    Attends Engineer, structural: Never    Marital Status: Married    Objective:  BP 120/70   Pulse 76   Temp (!) 97.1 F (36.2 C)   Resp 18   Ht 5\' 4"  (1.626 m)   Wt 226 lb (102.5 kg)   BMI 38.79 kg/m      04/04/2023   10:45 AM 02/27/2023    1:28 PM 02/20/2023   11:42 AM  BP/Weight  Systolic BP 120 110 127  Diastolic BP 70 62 45  Wt. (Lbs) 226 224.8   BMI 38.79 kg/m2 38.59 kg/m2     Physical Exam Vitals reviewed.  Constitutional:      Appearance: Normal appearance. She is obese.  Neck:     Vascular: No carotid  bruit.  Cardiovascular:     Rate and Rhythm: Normal rate and regular rhythm.     Heart sounds: Normal heart sounds.  Pulmonary:     Effort: Pulmonary effort is normal. No respiratory distress.     Breath sounds: Normal breath sounds.  Abdominal:     General: Abdomen is flat. Bowel sounds are normal.     Palpations: Abdomen is soft.     Tenderness: There is no abdominal tenderness.  Neurological:     Mental Status: She is alert and oriented to person,  place, and time.  Psychiatric:        Mood and Affect: Mood normal.        Behavior: Behavior normal.     Diabetic Foot Exam - Simple   Simple Foot Form Diabetic Foot exam was performed with the following findings: Yes 04/04/2023 11:23 AM  Visual Inspection Sensation Testing Pulse Check Comments Compression stockings BL.  Patient sees endocrinology who check her feet every 3 motnhs      Lab Results  Component Value Date   WBC 11.5 (H) 04/04/2023   HGB 9.6 (L) 04/04/2023   HCT 32.3 (L) 04/04/2023   PLT 243 04/04/2023   GLUCOSE 239 (H) 04/04/2023   CHOL 131 04/04/2023   TRIG 114 04/04/2023   HDL 42 04/04/2023   LDLCALC 68 04/04/2023   ALT 13 04/04/2023   AST 14 04/04/2023   NA 141 04/04/2023   K 4.5 04/04/2023   CL 103 04/04/2023   CREATININE 2.04 (H) 04/04/2023   BUN 59 (H) 04/04/2023   CO2 22 04/04/2023   TSH 2.900 01/19/2022   INR 1.1 05/16/2021   HGBA1C 8.5 (H) 02/13/2023      Assessment & Plan:    Hypertensive heart and renal disease with congestive heart failure (HCC) Assessment & Plan: Management per specialist.  Continue Carvedilol 12.5 mg two times daily, Imdur 60 mg daily, Torsemide 80 mg daily, Potassium 20 meq once daily, Brilanta 90 mg TWICE DAILY. See's Dr. Patrica Duel at [facility]. Weighs daily.   Orders: -     CBC with Differential/Platelet -     Comprehensive metabolic panel  Coronary artery disease of native artery of native heart with stable angina pectoris  [facility]) Assessment & Plan: Management per specialist.  Continue carvedilol 12.5 mg twice daily, torsemide 40 mg daily, Repatha 140 mg SQ every 2 weeks, Zetia 10 mg p.o. daily, aspirin 81 mg daily Brilinta 90 mg twice daily.   OSA on CPAP Assessment & Plan: Continue cpap.    GERD without esophagitis Assessment & Plan: The current medical regimen is effective;  continue present plan and medications. Continue protonix 40 mg daily.    Diabetic polyneuropathy associated with type 2 diabetes mellitus (HCC) Assessment & Plan: Control: good. Follow up with endocrinoloyg.  Recommend check sugars fasting daily. Recommend check feet daily. Recommend annual eye exams. Medicines: Continue Levemir 50-55 units daily, Novolog 2-10 units on sliding scale before meals.  Continue to work on eating a healthy diet and exercise.   Orders: -     Microalbumin / creatinine urine ratio  Vitamin D insufficiency Assessment & Plan: The current medical regimen is effective;  continue present plan and medications.   Orders: -     VITAMIN D 25 Hydroxy (Vit-D Deficiency, Fractures)  Mixed hyperlipidemia Assessment & Plan: Well controlled.  No changes to medicines. Continue  Repatha 140 mg every other week, Zetia 10 mg daily, Fish oil 1000 mg daily.  Continue to work on eating a healthy diet and exercise.  Labs drawn today.    Orders: -     Lipid panel  Encounter for immunization -     Influenza, MDCK, trivalent, PF(Flucelvax egg-free) -     Pfizer Comirnaty Covid-19 Vaccine 29yrs & older  Other orders -     [facility] CKD Program     No orders of the defined types were placed in this encounter.   Orders Placed This Encounter  Procedures   Influenza, MDCK, trivalent, PF(Flucelvax egg-free)   Pfizer Comirnaty  Covid-19 Vaccine 59yrs & older   CBC with Differential/Platelet   Comprehensive metabolic panel   Lipid panel   VITAMIN D 25 Hydroxy (Vit-D Deficiency, Fractures)   Microalbumin /  creatinine urine ratio   [facility] CKD Program     Follow-up: Return in about 3 months (around 07/04/2023) for chronic follow up.   I,Marla I Leal-Borjas,acting as a scribe for Blane Ohara, MD.,have documented all relevant documentation on the behalf of Blane Ohara, MD,as directed by  Blane Ohara, MD while in the presence of Blane Ohara, MD.   An After Visit Summary was printed and given to the patient.  Blane Ohara, MD [facility] [phone]

## 2023-04-03 NOTE — Assessment & Plan Note (Signed)
Continue cpap.

## 2023-04-03 NOTE — Assessment & Plan Note (Signed)
Management per specialist.  Continue Carvedilol 12.5 mg two times daily, Imdur 60 mg daily, Torsemide 80 mg daily, Potassium 20 meq once daily, Brilanta 90 mg TWICE DAILY. See's Dr. Patrica Duel [facility]. Weighs daily.

## 2023-04-03 NOTE — Assessment & Plan Note (Addendum)
Control: good. Follow up with endocrinoloyg.  Recommend check sugars fasting daily. Recommend check feet daily. Recommend annual eye exams. Medicines: Continue Levemir 50-55 units daily, Novolog 2-10 units on sliding scale before meals.  Continue to work on eating a healthy diet and exercise.

## 2023-04-04 ENCOUNTER — Encounter (HOSPITAL_COMMUNITY): Payer: PPO | Admitting: Internal Medicine

## 2023-04-04 ENCOUNTER — Ambulatory Visit (INDEPENDENT_AMBULATORY_CARE_PROVIDER_SITE_OTHER): Payer: PPO | Admitting: Family Medicine

## 2023-04-04 ENCOUNTER — Encounter: Payer: Self-pay | Admitting: Family Medicine

## 2023-04-04 VITALS — BP 120/70 | HR 76 | Temp 97.1°F | Resp 18 | Ht 64.0 in | Wt 226.0 lb

## 2023-04-04 DIAGNOSIS — I130 Hypertensive heart and chronic kidney disease with heart failure and stage 1 through stage 4 chronic kidney disease, or unspecified chronic kidney disease: Secondary | ICD-10-CM

## 2023-04-04 DIAGNOSIS — E559 Vitamin D deficiency, unspecified: Secondary | ICD-10-CM | POA: Diagnosis not present

## 2023-04-04 DIAGNOSIS — I25118 Atherosclerotic heart disease of native coronary artery with other forms of angina pectoris: Secondary | ICD-10-CM

## 2023-04-04 DIAGNOSIS — J9611 Chronic respiratory failure with hypoxia: Secondary | ICD-10-CM

## 2023-04-04 DIAGNOSIS — I509 Heart failure, unspecified: Secondary | ICD-10-CM | POA: Diagnosis not present

## 2023-04-04 DIAGNOSIS — Z23 Encounter for immunization: Secondary | ICD-10-CM | POA: Diagnosis not present

## 2023-04-04 DIAGNOSIS — G4733 Obstructive sleep apnea (adult) (pediatric): Secondary | ICD-10-CM

## 2023-04-04 DIAGNOSIS — E1142 Type 2 diabetes mellitus with diabetic polyneuropathy: Secondary | ICD-10-CM

## 2023-04-04 DIAGNOSIS — N189 Chronic kidney disease, unspecified: Secondary | ICD-10-CM | POA: Diagnosis not present

## 2023-04-04 DIAGNOSIS — E782 Mixed hyperlipidemia: Secondary | ICD-10-CM

## 2023-04-04 DIAGNOSIS — Z89412 Acquired absence of left great toe: Secondary | ICD-10-CM

## 2023-04-04 DIAGNOSIS — K219 Gastro-esophageal reflux disease without esophagitis: Secondary | ICD-10-CM

## 2023-04-04 NOTE — Patient Instructions (Signed)
Health Maintenance  Topic Date Due   Yearly kidney health urinalysis for diabetes  Never done   DTaP/Tdap/Td vaccine (1 - Tdap) Never done   Zoster (Shingles) Vaccine (1 of 2) Never done   Pap with HPV screening  Never done   Eye exam for diabetics  11/14/2022   Mammogram  03/15/2023   COVID-19 Vaccine (5 - 2023-24 season) 05/30/2023   Hemoglobin A1C  08/16/2023   Yearly kidney function blood test for diabetes  02/27/2024   Medicare Annual Wellness Visit  04/02/2024   Complete foot exam   04/03/2024   Colon Cancer Screening  04/06/2026   Flu Shot  Completed   Hepatitis C Screening  Completed   HIV Screening  Completed   HPV Vaccine  Aged Out

## 2023-04-05 ENCOUNTER — Other Ambulatory Visit: Payer: PPO | Admitting: Pharmacist

## 2023-04-05 LAB — COMPREHENSIVE METABOLIC PANEL
ALT: 13 IU/L (ref 0–32)
AST: 14 IU/L (ref 0–40)
Albumin: 3.6 g/dL — ABNORMAL LOW (ref 3.8–4.9)
Alkaline Phosphatase: 177 IU/L — ABNORMAL HIGH (ref 44–121)
BUN/Creatinine Ratio: 29 — ABNORMAL HIGH (ref 12–28)
BUN: 59 mg/dL — ABNORMAL HIGH (ref 8–27)
Bilirubin Total: 0.4 mg/dL (ref 0.0–1.2)
CO2: 22 mmol/L (ref 20–29)
Calcium: 8.6 mg/dL — ABNORMAL LOW (ref 8.7–10.3)
Chloride: 103 mmol/L (ref 96–106)
Creatinine, Ser: 2.04 mg/dL — ABNORMAL HIGH (ref 0.57–1.00)
Globulin, Total: 3.4 g/dL (ref 1.5–4.5)
Glucose: 239 mg/dL — ABNORMAL HIGH (ref 70–99)
Potassium: 4.5 mmol/L (ref 3.5–5.2)
Sodium: 141 mmol/L (ref 134–144)
Total Protein: 7 g/dL (ref 6.0–8.5)
eGFR: 27 mL/min/{1.73_m2} — ABNORMAL LOW (ref >59)

## 2023-04-05 LAB — CBC WITH DIFFERENTIAL/PLATELET
Basophils Absolute: 0.1 10*3/uL (ref 0.0–0.2)
Basos: 1 %
EOS (ABSOLUTE): 0.2 10*3/uL (ref 0.0–0.4)
Eos: 2 %
Hematocrit: 32.3 % — ABNORMAL LOW (ref 34.0–46.6)
Hemoglobin: 9.6 g/dL — ABNORMAL LOW (ref 11.1–15.9)
Immature Grans (Abs): 0 10*3/uL (ref 0.0–0.1)
Immature Granulocytes: 0 %
Lymphocytes Absolute: 1.8 10*3/uL (ref 0.7–3.1)
Lymphs: 16 %
MCH: 27.4 pg (ref 26.6–33.0)
MCHC: 29.7 g/dL — ABNORMAL LOW (ref 31.5–35.7)
MCV: 92 fL (ref 79–97)
Monocytes Absolute: 0.7 10*3/uL (ref 0.1–0.9)
Monocytes: 6 %
Neutrophils Absolute: 8.8 10*3/uL — ABNORMAL HIGH (ref 1.4–7.0)
Neutrophils: 75 %
Platelets: 243 10*3/uL (ref 150–450)
RBC: 3.51 x10E6/uL — ABNORMAL LOW (ref 3.77–5.28)
RDW: 14.8 % (ref 11.7–15.4)
WBC: 11.5 10*3/uL — ABNORMAL HIGH (ref 3.4–10.8)

## 2023-04-05 LAB — MICROALBUMIN / CREATININE URINE RATIO
Creatinine, Urine: 48.7 mg/dL
Microalb/Creat Ratio: 253 mg/g creat — ABNORMAL HIGH (ref 0–29)
Microalbumin, Urine: 123 ug/mL

## 2023-04-05 LAB — LIPID PANEL
Chol/HDL Ratio: 3.1 ratio (ref 0.0–4.4)
Cholesterol, Total: 131 mg/dL (ref 100–199)
HDL: 42 mg/dL (ref >39)
LDL Chol Calc (NIH): 68 mg/dL (ref 0–99)
Triglycerides: 114 mg/dL (ref 0–149)
VLDL Cholesterol Cal: 21 mg/dL (ref 5–40)

## 2023-04-05 LAB — VITAMIN D 25 HYDROXY (VIT D DEFICIENCY, FRACTURES): Vit D, 25-Hydroxy: 25.2 ng/mL — ABNORMAL LOW (ref 30.0–100.0)

## 2023-04-05 LAB — LITHOLINK CKD PROGRAM

## 2023-04-05 NOTE — Assessment & Plan Note (Signed)
Well controlled.  No changes to medicines. Continue  Repatha 140 mg every other week, Zetia 10 mg daily, Fish oil 1000 mg daily.  Continue to work on eating a healthy diet and exercise.  Labs drawn today.

## 2023-04-05 NOTE — Patient Instructions (Signed)
Shravani,  See below:  Please call the [facility] at [phone] to confirm your shipping address and provide a method of payment to place on file for future copays.   You can request prescription refills and request mail order through the MyChart App.

## 2023-04-05 NOTE — Progress Notes (Signed)
04/05/2023 Name: Dana [name] MRN: [id number] DOB: Jan 26, 1961  Care Coordination Call  Outreached patient for prior plans of transitioning from [facility] pharmacy to [facility]. Patient states this has not happened yet. She has about 12 days remaining of medication supply.  Facilitating with PCP to send RX to [facility] for mail delivery, encouraged patient to contact [facility] to confirm mail address and method of payment.  Lynnda Shields, PharmD, BCPS Clinical Pharmacist [facility]

## 2023-04-05 NOTE — Assessment & Plan Note (Signed)
The current medical regimen is effective;  continue present plan and medications.  

## 2023-04-08 ENCOUNTER — Other Ambulatory Visit: Payer: PPO | Admitting: Pharmacist

## 2023-04-08 ENCOUNTER — Other Ambulatory Visit: Payer: Self-pay

## 2023-04-08 ENCOUNTER — Encounter: Payer: Self-pay | Admitting: Family Medicine

## 2023-04-08 ENCOUNTER — Other Ambulatory Visit: Payer: Self-pay | Admitting: Family Medicine

## 2023-04-08 MED ORDER — LORAZEPAM 0.5 MG PO TABS: 0.5000 mg | ORAL_TABLET | Freq: Every day | ORAL | 2 refills | Status: DC | PRN

## 2023-04-08 MED ORDER — HYDROCODONE-ACETAMINOPHEN 7.5-325 MG PO TABS: 1.0000 | ORAL_TABLET | Freq: Three times a day (TID) | ORAL | 0 refills | Status: DC | PRN

## 2023-04-08 MED ORDER — PROMETHAZINE HCL 25 MG PO TABS: 25.0000 mg | ORAL_TABLET | Freq: Every day | ORAL | 3 refills | Status: DC | PRN

## 2023-04-14 NOTE — Progress Notes (Signed)
Done. kc 

## 2023-04-16 ENCOUNTER — Encounter (HOSPITAL_COMMUNITY): Payer: Self-pay | Admitting: Internal Medicine

## 2023-04-16 ENCOUNTER — Ambulatory Visit (HOSPITAL_COMMUNITY)
Admission: RE | Admit: 2023-04-16 | Discharge: 2023-04-16 | Disposition: A | Discharge status: Home / Self Care | Payer: PPO | Source: Ambulatory Visit | Attending: Internal Medicine | Admitting: Internal Medicine

## 2023-04-16 VITALS — BP 126/62 | HR 70 | Ht 65.0 in | Wt 235.0 lb

## 2023-04-16 DIAGNOSIS — I5022 Chronic systolic (congestive) heart failure: Secondary | ICD-10-CM

## 2023-04-16 DIAGNOSIS — I2510 Atherosclerotic heart disease of native coronary artery without angina pectoris: Secondary | ICD-10-CM | POA: Diagnosis not present

## 2023-04-16 DIAGNOSIS — N184 Chronic kidney disease, stage 4 (severe): Secondary | ICD-10-CM | POA: Diagnosis not present

## 2023-04-16 DIAGNOSIS — F411 Generalized anxiety disorder: Secondary | ICD-10-CM | POA: Diagnosis not present

## 2023-04-16 DIAGNOSIS — N1832 Chronic kidney disease, stage 3b: Secondary | ICD-10-CM

## 2023-04-16 MED ORDER — EZETIMIBE 10 MG PO TABS: 10.0000 mg | ORAL_TABLET | Freq: Every evening | ORAL | 3 refills | Status: DC

## 2023-04-16 NOTE — Patient Instructions (Signed)
There has been no changes to your medications.  Your physician recommends that you schedule a follow-up appointment in: 3 months ( January 2025) ** PLEASE CALL THE OFFICE IN NOVEMBER TO ARRANGE YOUR FOLLOW UP APPOINTMENT. **  If you have any questions or concerns before your next appointment please send [name] a message through [place] or call our office at [phone].    TO LEAVE A MESSAGE FOR THE NURSE SELECT OPTION 2, PLEASE LEAVE A MESSAGE INCLUDING: YOUR NAME DATE OF BIRTH CALL BACK NUMBER REASON FOR CALL**this is important as we prioritize the call backs  YOU WILL RECEIVE A CALL BACK THE SAME DAY AS LONG AS YOU CALL BEFORE 4:00 PM  At the [facility], you and your health needs are our priority. As part of our continuing mission to provide you with exceptional heart care, we have created designated Provider Care Teams. These Care Teams include your primary Cardiologist (physician) and Advanced Practice Providers (APPs- Physician Assistants and Nurse Practitioners) who all work together to provide you with the care you need, when you need it.   You may see any of the following providers on your designated Care Team at your next follow up: Dr Arvilla Meres Dr Marca Ancona Dr. Marcos Eke, NP Robbie Lis, [place] [facility] [place], [place] Brynda Peon, NP Karle Plumber, PharmD   Please be sure to bring in all your medications bottles to every appointment.    Thank you for choosing [facility]

## 2023-04-16 NOTE — Progress Notes (Signed)
Advanced Heart Failure Telehealth Note   PCP: CoxFritzi Mandes, MD HF Cardiologist: Arvilla Meres, MD   Reason for Visit: f/u chronic diastolic HF  HPI: [name] is 62 y.o. female with history of diastolic heart failure s/p cardiomems 02/2018, CAD, s/p DES RCA and LAD 2018, DMI, htn, hyperlipidemia, PAD, and OSA on CPAP, PAF s/p ablation, PAD s/p L great toe and 4th toe amputation, stage IIIb CKD.   Admitted 03/22 with NSTEMI and a/c CHF. LHC 03/22 showed moderate mid-LAD and mid-RCA disease, severe mid-L-Cx stenosis s/p PCI/DES mid Cx and side branch angioplasty of OM1.   Admitted 6/22 to [facility] for DKA, started on insulin gtt, IVF and diuretics held. Developed pulmonary vascular congestion vs PNA and was started on IV abx, and diuretics were resumed. She suffered a PEA arrest 01/12/21 with ROSC after 10-15 minutes of CPR and was transferred to [facility]; precipitating factor for PEA arrest thought to be respiratory driven. On arrival, she was still in DKA, AKI on CKD with oliguria and Nephrology was consulted for further management. TTE with EF 60%, min MR/TR. Concern for stroke due to left-sided weakness and MRI confirmed anoxic brain injury, but no definite infarct. She was extubated, SCr returned to baseline of 1.6-1.8 and she was discharged to rehab facility.   Zio placed 8/22 for palpitations, showing mostly SB/SR no afib, 66 SVT runs, frequent isolated SVE (11.2%).  Admitted from AHF clinic 10/22 for A/C CHF after failing outpatient escalation in diuretic therapy. She was enrolled in FASTR trial and diuresed > 15 lb.  Hospitalization complicated by AKI on CKD, Scr up to 3.13. RHC showed mild to moderate elevating filling pressures with large v-waves in PCWP tracing suggesting severe MR vs severe diastolic dysfunction. TEE 55-60% showed severe central MR.   Patient underwent TEER of mitral valve with placement of 1XT W and 1NT W clip on 05/18/2021. NT W clip was entangled and had to be  deployed into lateral part of valve. No additional clip placement could be preformed. Echo 11/04 with EF 50-55% and residual moderate MR. Given 1 dose IV lasix prior to discharge.  Echo 3/23 showed EF 55-60%, RV OK, moderate MR mean gradient 7-8 mmHg. Saw Dr. Lynnette Caffey and MR now moderate, no indication for 3rd clip.   Admitted 2/24 with CP. LHC showed 2 vessel obstructive CAD, with new 90% stenosis pLAD with RFR 0.48. The stent in the LCx is occluded after a large first OM; Patent stent in the distal RCA; Mildly elevated LVEDP 21 mm Hg; Successful PCI of the proximal LAD with DES x 1 overlapping with prior stent. Lcx occlusion to be treated medically. Elevated LVEDP on cath and given IV lasix and discharged home, weight 231 lbs.  Admitted 5/24 with NSTEMI. LHC 12/08/22 with DES to LAD, also with 85% ostial proximal RCA stenosis. LVEDP 23. She was given 60 cc contrast and IVF post cath, continued to have worsening renal function with SCr up to 4.25. Echo showed EF 50-55%, RV mildly reduced. Post mitral clip x 2 with XTW and NTW the latter entangled in chords no obvious SLD noted mean gradient 5 mmHg. Mild residual MR appears medial to the two clips. AHF and nephrology consulted. No indication for HD, diuresed with IV lasix eventually transitioned or torsemide 80 mg daily. She was discharged home, weight 224 lbs.  Admitted 8/24 with CP. Underwent LHC showing severe restenosis in the proximal LAD stent, s/p successful IVUS guided PTCA/DES x 1 proximal LAD,  chronic occlusion mid Circumflex beyond the stented segment. Patent obtuse marginal branch with severe distal stenosis (too small for PCI), dominant RCA with moderate proximal stenosis, patent distal stent, LVEDP 19 mmhg. Renal function worsened with dye load from PCI, SCr 1.9>>3.35. Nephrology consulted, and underwent TDC placement, however renal function improved and she did not require dialysis. GDMT remained, but Imdur stopped with soft BP. Discharged  home, weight 224 lbs.  She was unable to do in-person visit today as she is with her mother in a nursing facility. She is calling in on a video visit from the nursing facility. I am in the office on video. Feels good. No recent CP. No need for NTG. BP well controlled. Mild LE edema. No need for extra lasix.   Cardiac Studies: - LHC (8/24): IVUS guided PTCA/DES x 1 proximal LAD, chronic occlusion mid Circ beyond the stented segment, patent obtuse marginal branch with severe distal stenosis (too small for PCI), dominant RCA with moderate proximal stenosis, patent distal stent, LVEDP 19 mmhg.  - Echo (5/24): EF 50-55%. RV mildly reduced, post mitral clip x 2 with XTW and NTW the latter entangled in chords, no obvious SLD noted, mean gradient 5 mmHg, mild residual MR appears medial to the two clips.  - LHC (5/24): DES to LAD, osial prox RCA with severe 85% stenosis-->will need staged intervention when renal function stabilized.  - LHC (2/24): 2v obstructive CAD. New 90% stenosis pLAD with RFR 0.48. The stent in the LCx is occluded after a large first OM. Patent stent in the distal RCA. S/p PCI of pLAD with DES x 1 overlapping with prior stent.  - Echo (1/24): EF 55-60%, normal RV and moderate MR, mG 7.5 mmHg, essentially unchanged from prior echo.   Review of systems complete and found to be negative unless listed in HPI.    Past Medical History:  Diagnosis Date   Acquired absence of left great toe (HCC)    Acquired absence of other left toe(s) (HCC)    ACS (acute coronary syndrome) (HCC)    Anemia    Anoxic brain injury (HCC)    Anxiety    CAD (coronary artery disease)    DES mLAD 04/07/15; DES dRCA & DES pPDA 08/30/16; DES mCX & PTCA OM1 09/29/20   Chronic diastolic (congestive) heart failure (HCC)    Chronic pain    Chronic systolic (congestive) heart failure (HCC)    CKD (chronic kidney disease)    Contrast dye induced nephropathy, possible 09/03/2020   COPD (chronic obstructive  pulmonary disease) (HCC)    COVID-19 virus infection 03/03/2023   Diabetes (HCC)type 1    Diastolic CHF (HCC)    Dyspnea    Family history of breast cancer 10/23/2021   Gastro-esophageal reflux disease without esophagitis    Hyperlipidemia    Hypertension    Hypertensive heart disease with heart failure (HCC)    Hypoxemia    Idiopathic gout, multiple sites    Leukocytosis 03/29/2022   Localized edema    Low back pain    Mitral regurgitation    Mitral regurgitation    Mixed hyperlipidemia    Mixed incontinence    Morbid (severe) obesity due to excess calories (HCC)    Neuromuscular disorder (HCC)    Neuropathy    Non-ST elevation (NSTEMI) myocardial infarction (HCC)    08/29/20, 09/29/20   OSA (obstructive sleep apnea) 09/03/2020   Other specified hypothyroidism    PAF (paroxysmal atrial fibrillation) (HCC)    s/p pulmonary  vein isolation by cryoablation 10/04/15 Dr. Rudolpho Sevin in [facility]   PEA (Pulseless electrical activity) [facility])    ~ 01/13/21 at [facility] during admission DKA, volume overload, possible CAP, hypoxia s/p ACLS with ROSC ~ 10-15   Pneumonia    Primary insomnia    Rheumatoid arthritis (HCC)    Stroke (HCC)    Thyroid disease    Type 1 diabetes mellitus (HCC)    Vitamin D deficiency    Current Outpatient Medications  Medication Sig Dispense Refill   allopurinol (ZYLOPRIM) 300 MG tablet TAKE ONE TABLET BY MOUTH ONCE DAILY 90 tablet 0   aspirin EC 81 MG tablet Take 1 tablet (81 mg total) by mouth daily. Swallow whole. 30 tablet 12   busPIRone (BUSPAR) 10 MG tablet Take 1 tablet (10 mg total) by mouth 3 (three) times daily. 90 tablet 2   carvedilol (COREG) 12.5 MG tablet TAKE ONE TABLET BY MOUTH twice daily 180 tablet 3   Continuous Blood Gluc Sensor (FREESTYLE LIBRE 2 SENSOR) MISC USE TO check blood glucose AS DIRECTED AND CHANGE sensor every 14 DAYS     CRANBERRY PO Take 2 tablets by mouth daily.     EPINEPHrine 0.3 mg/0.3 mL IJ SOAJ injection Use as directed  for life-threatening allergic reaction. 4 Device 3   ezetimibe (ZETIA) 10 MG tablet Take 1 tablet (10 mg total) by mouth daily. (Patient taking differently: Take 10 mg by mouth every evening.) 90 tablet 1   HYDROcodone-acetaminophen (NORCO) 7.5-325 MG tablet Take 1 tablet by mouth 3 (three) times daily as needed. 90 tablet 0   insulin aspart (NOVOLOG FLEXPEN) 100 UNIT/ML FlexPen 2-10 U before meals based on SSI. 15 mL 3   Insulin Pen Needle (BD PEN NEEDLE NANO 2ND GEN) 32G X 4 MM MISC Inject 1 each into the skin in the morning, at noon, in the evening, and at bedtime. 600 each 3   latanoprost (XALATAN) 0.005 % ophthalmic solution Place 1 drop into both eyes at bedtime.     LEVEMIR FLEXPEN 100 UNIT/ML FlexPen Inject 50 units into THE SKIN daily (Patient taking differently: Inject 55 Units into the skin daily.) 15 mL 1   linaclotide (LINZESS) 145 MCG CAPS capsule TAKE ONE CAPSULE BY MOUTH BEFORE BREAKFAST 90 capsule 0   LORazepam (ATIVAN) 0.5 MG tablet Take 1 tablet (0.5 mg total) by mouth daily as needed for anxiety. 30 tablet 2   magnesium oxide (MAG-OX) 400 MG tablet Take 1 tablet (400 mg total) by mouth daily. Take 2 (400 mg) daily=800 mg (Patient taking differently: Take 800 mg by mouth daily. Take 2 (400 mg) daily=800 mg) 60 tablet 1   nitroGLYCERIN (NITROSTAT) 0.4 MG SL tablet DISSOLVE ONE TABLET UNDER THE TONGUE EVERY 5 MINUTES AS NEEDED FOR CHEST PAIN.  DO NOT EXCEED A TOTAL OF 3 DOSES IN 15 MINUTES 25 tablet 0   Omega-3 Fatty Acids (FISH OIL PO) Take 1 capsule by mouth daily.     OXYGEN Inhale 2 L into the lungs at bedtime.     pantoprazole (PROTONIX) 40 MG tablet TAKE ONE TABLET BY MOUTH ONCE DAILY 90 tablet 0   polyethylene glycol (MIRALAX / GLYCOLAX) 17 g packet Take 17 g by mouth daily. 14 each 0   potassium chloride SA (KLOR-CON M) 20 MEQ tablet TAKE ONE TABLET BY MOUTH ONCE DAILY (Patient taking differently: Take 20 mEq by mouth 2 (two) times a week.) 90 tablet 0   Probiotic CAPS Take 1  capsule by mouth  daily.     promethazine (PHENERGAN) 25 MG tablet Take 1 tablet (25 mg total) by mouth daily as needed. 30 tablet 3   REPATHA SURECLICK 140 MG/ML SOAJ inject 140mg  intot HEART SKIN every 14 DAYS 6 mL 1   ticagrelor (BRILINTA) 90 MG TABS tablet Take 1 tablet (90 mg total) by mouth 2 (two) times daily. 60 tablet 6   torsemide 40 MG TABS Take 40 mg by mouth daily. (Patient taking differently: Take 60 mg by mouth daily.) 30 tablet 0   No current facility-administered medications for this encounter.   Allergies  Allergen Reactions   Naproxen Hives and Rash   Shellfish-Derived Products Hives, Swelling and Other (See Comments)    Angioedema    Strawberry (Diagnostic) Hives   Celecoxib Other (See Comments)    Stomach pain   Glucosamine Hives, Swelling and Other (See Comments)    Angioedema    Iron     IV iron transfusion - hives - Feb 2022 hospitalization   Metoclopramide Other (See Comments)    Facial twitching and stuttering   Metolazone Other (See Comments)    Acute renal failure   Statins Other (See Comments)    Muscle pain   Social History   Socioeconomic History   Marital status: Married    Spouse name: Dana [name]   Number of children: 2   Years of education: 16   Highest education level: Master's degree (e.g., MA, MS, MEng, MEd, MSW, MBA)  Occupational History   Occupation: on disability/retired early former Engineer, site  Tobacco Use   Smoking status: Former    Current packs/day: 0.00    Types: Cigarettes    Start date: 11    Quit date: 1984    Years since quitting: 40.7    Passive exposure: Past   Smokeless tobacco: Never  Vaping Use   Vaping status: Never Used  Substance and Sexual Activity   Alcohol use: Never   Drug use: Never   Sexual activity: Not on file  Other Topics Concern   Not on file  Social History Narrative   Not on file   Social Determinants of Health   Financial Resource Strain: Medium Risk (10/30/2022)   Overall Financial  Resource Strain (CARDIA)    Difficulty of Paying Living Expenses: Somewhat hard  Food Insecurity: No Food Insecurity (02/13/2023)   Hunger Vital Sign    Worried About Running Out of Food in the Last Year: Never true    Ran Out of Food in the Last Year: Never true  Transportation Needs: No Transportation Needs (02/13/2023)   PRAPARE - Administrator, Civil Service (Medical): No    Lack of Transportation (Non-Medical): No  Physical Activity: Inactive (03/26/2023)   Exercise Vital Sign    Days of Exercise per Week: 0 days    Minutes of Exercise per Session: 0 min  Stress: Stress Concern Present (03/26/2023)   [employer] of Occupational Health - Occupational Stress Questionnaire    Feeling of Stress : To some extent  Social Connections: Moderately Integrated (07/25/2022)   Social Connection and Isolation Panel [NHANES]    Frequency of Communication with Friends and Family: More than three times a week    Frequency of Social Gatherings with Friends and Family: More than three times a week    Attends Religious Services: More than 4 times per year    Active Member of [employer] or Organizations: No    Attends Banker Meetings: Never  Marital Status: Married  Catering manager Violence: Not At Risk (02/13/2023)   Humiliation, Afraid, Rape, and Kick questionnaire    Fear of Current or Ex-Partner: No    Emotionally Abused: No    Physically Abused: No    Sexually Abused: No   Family History  Problem Relation Age of Onset   Breast cancer Mother 54       contralateral breast ca dx 37   Coronary artery disease Mother    Hypertension Mother    Hyperlipidemia Mother    Diabetes type II Mother    Lung cancer Mother 49   Diabetes Father    Hypertension Father    Mental illness Sister    Bipolar disorder Other    Depression Other    Schizophrenia Other    Cancer Other        MGF's sisters x2; unknown type   BP 126/62 Comment: patient reported  Pulse 70 Comment:  patient reported  Ht 5\' 5"  (1.651 m)   Wt 106.6 kg (235 lb) Comment: patient reported  BMI 39.11 kg/m   Wt Readings from Last 3 Encounters:  04/16/23 106.6 kg (235 lb)  04/04/23 102.5 kg (226 lb)  02/27/23 102 kg (224 lb 12.8 oz)   PHYSICAL EXAM on video visit: General:  Well appearing. No resp difficulty. Speaks in full sentences HEENT: normal Neck: supple. no JVD. Carotids 2+ bilat; no bruits. No lymphadenopathy or thryomegaly appreciated. Cor: Not examined Lungs: not examined  Abdomen: obese soft, nontender, nondistended. No hepatosplenomegaly. No bruits or masses. Good bowel sounds. Extremities: no cyanosis, clubbing, rash, edema Neuro: alert & orientedx3, cranial nerves grossly intact. moves all 4 extremities w/o difficulty. Affect pleasant   CardioMEMs: no readings to review   ASSESSMENT & PLAN: 1. CAD - H/o multiple PCI with stents to mid LAD and pRCA.  - NSTEMI (2/22). Cath deferred due to AKI. Treated medically.  - NSTEMI (7/22)-->S/p PCI/DES to mid Cx, balloon angioplasty to OM1  - LHC (2/24) for chest pain--> new 90% stenosis pLAD. Successful PCI of pLAD with DES x 1 overlapping with prior stent.  - LHC (5/24) --> DES to LAD, severe 85% ostial prox RCA will need eventual intervention but renal function precludes.  - LHC (8/24): s/p IVUS guided PTCA/DES x 1 proximal LAD - Doing well. No s/s angina  - Continue ASA + Brilinta. - Continue Coreg. - Continue Repatha + Zetia (allergy statins). Recent LDL 66 - Off Imdur with low BP - will refill Zetia today  2. Chronic Diastolic Heart Failure.  - Echo 08/2020 EF 55-60%  - Echo (3/22): EF 55-60%, Grade II DD, normal RV - Echo (6/22): EF 60%, mod MR/TR (per chart review at [facility]). - Admit 10/22 with a/c CHF, enrolled in FASTR trial and diuresed 15 lbs. Severe MR likely contributing to decompensation.  - RHC (10/22): mild to moderate elevated filling pressures, large v-waves PCWP tracing - TEE (10/22): EF 55-60%, basal  inferior akinesis, severe central MR.>>Underwent TEER>>mod residual MR - Echo (11/22): EF 50-55%, severe LV HK, moderate MR - Echo (3/23): EF 55-60%, RV OK, moderate MR mean gradient 7-8 mmHg - Echo (1/24): EF 55-60%, RV normal, mod MR mG 7.5 mmHg  - Echo (5/24): EF 55-60%, mild LVH, normal RV, mild MR - Improved NYHA II Volume status ok - Continue torsemide 80 mg daily + 20 KCL daily. - Continue carvedilol 12.5 mg bid.   3. CKD IV - Prior h/o CIN following LHC - Previous SCr baseline 1.6-1.8  with multiple AKIs over the last year. New baseline ~ 2.0-2.5 - Followed by Nephrology in [facility], planning on switching to [facility] - Furoscix to use PRN and limit metolazone use - Recent AKI post cath (5/24), 60 cc contrast used. SCr 2.7-->4.25 - Recent AKI post cath (8/24), 80 cc contrast used. SCr peak at 5.19>> 2.18 - BMET on 04/04/23 Scr 2.04 (reviewed persoanlly)  4. MR  - Severe central MR on TEE on 10/22.   - S/p TEER 05/18/2021 - two clips placed, one became entangled and had to be deployed. - Moderate residual MR on echo 11/22. - Echo (1/24) with stable moderate MR, mG 7.5 mmHg  - Echo (5/24): post mitral clip x 2 with XTW and NTW the latter entangled in chords no obvious SLD noted mean gradient 5 mmHg. Mild residual MR appears medial to the two clips    5. OSA - Continue nightly CPAP - No change   6. DM I - On insulin - Recent A1c 8.5 - No SGLT2i   Arvilla Meres, MD  2:02 PM

## 2023-04-18 ENCOUNTER — Other Ambulatory Visit: Payer: Self-pay | Admitting: Physician Assistant

## 2023-04-22 ENCOUNTER — Encounter: Payer: Self-pay | Admitting: Oncology

## 2023-04-24 ENCOUNTER — Other Ambulatory Visit: Payer: PPO | Admitting: Pharmacist

## 2023-04-24 NOTE — Patient Instructions (Signed)
Please call the [facility] at [phone] to confirm your shipping address and provide a method of payment to place on file for future copays.   You can request prescription refills and request mail order through the MyChart App.

## 2023-04-24 NOTE — Progress Notes (Signed)
04/24/2023 Name: ANTONINA PETROVIC MRN: [id number] DOB: [birth year]   HEARTLEE DOIDGE is a 62 y.o. year old female who presented for a telephone visit.    They were referred to the pharmacist by  [facility]  for assistance in managing medication access and transfer of care .    Subjective:  Care Team: Primary Care Provider: Blane Ohara, MD  Medication Access/Adherence  Current Pharmacy:  [facility] [address] [place] [address] Phone: [phone] Fax: [phone]  [facility] [address] [place] - [address] [place] [address] Phone: [phone] Fax: [phone]   Patient reports affordability concerns with their medications: No  Patient reports access/transportation concerns to their pharmacy: Yes needs [facility] Patient reports adherence concerns with their medications:  No     Medication Management:  Assessed patient for medication access barriers: - Currently uses [employer] grant for repatha copay costs - [employer] ID [id number] and good through 11/22/2023. - No other PAPs or ongoing medication programs.  - Seeking transfer of pharmacy, needs to send medications to [facility] pharmacy. - Patient still needs to contact [facility] to confirm address, states she is under stress with her mother having fallen twice recently.   Objective:  Lab Results  Component Value Date   HGBA1C 8.5 (H) 02/13/2023    Lab Results  Component Value Date   CREATININE 2.04 (H) 04/04/2023   BUN 59 (H) 04/04/2023   NA 141 04/04/2023   K 4.5 04/04/2023   CL 103 04/04/2023   CO2 22 04/04/2023    Lab Results  Component Value Date   CHOL 131 04/04/2023   HDL 42 04/04/2023   LDLCALC 68 04/04/2023   TRIG 114 04/04/2023   CHOLHDL 3.1 04/04/2023    Medications Reviewed Today     Reviewed by Gabriel Carina, RPH (Pharmacist) on 04/24/23 at  1649  Med List Status: <None>   Medication Order Taking? Sig Documenting Provider Last Dose Status Informant  allopurinol (ZYLOPRIM) 300 MG tablet [id number] No TAKE ONE TABLET BY MOUTH ONCE DAILY Cox, Kirsten, MD Taking Active Self  aspirin EC 81 MG tablet [id number] No Take 1 tablet (81 mg total) by mouth daily. Swallow whole. Cyndi Bender, NP Taking Active Self  busPIRone (BUSPAR) 10 MG tablet [id number] No Take 1 tablet (10 mg total) by mouth 3 (three) times daily. Cox, Kirsten, MD Taking Active   carvedilol (COREG) 12.5 MG tablet [id number] No TAKE ONE TABLET BY MOUTH twice daily Bensimhon, Bevelyn Buckles, MD Taking Active Self  Continuous Blood Gluc Sensor (FREESTYLE LIBRE 2 SENSOR) [place] [id number] No USE TO check blood glucose AS DIRECTED AND CHANGE sensor every 14 DAYS [provider] Taking Active Self  CRANBERRY PO [id number] No Take 2 tablets by mouth daily. [provider] Taking Active Self  EPINEPHrine 0.3 mg/0.3 mL IJ SOAJ injection [id number] No Use as directed for life-threatening allergic reaction. Marcelyn Bruins, MD Taking Active Self  ezetimibe (ZETIA) 10 MG tablet [id number]  Take 1 tablet (10 mg total) by mouth every evening. Bensimhon, Bevelyn Buckles, MD  Active   HYDROcodone-acetaminophen [facility]) 7.5-325 MG tablet [id number] No Take 1 tablet by mouth 3 (three) times daily as needed. Cox, Kirsten, MD Taking Active   insulin aspart (NOVOLOG FLEXPEN) 100 UNIT/ML FlexPen [id number] No 2-10 U before meals based on SSI. CoxFritzi Mandes, MD Taking Active Self  Insulin Pen Needle (BD PEN  NEEDLE NANO 2ND GEN) 32G X 4 MM MISC [id number] No Inject 1 each into the skin in the morning, at noon, in the evening, and at bedtime. Cox, Kirsten, MD Taking Active Self  latanoprost (XALATAN) 0.005 % ophthalmic solution [id number] No Place 1 drop into both eyes at bedtime. [provider] Taking Active Self  LEVEMIR FLEXPEN 100 UNIT/ML FlexPen [id number] No Inject 50 units into THE SKIN daily   Patient taking differently: Inject 55 Units into the skin daily.   Cox, Kirsten, MD Taking Active Self  linaclotide Karlene Einstein) 145 MCG CAPS capsule [id number] No TAKE ONE CAPSULE BY MOUTH BEFORE BREAKFAST Cox, Kirsten, MD Taking Active Self  LORazepam (ATIVAN) 0.5 MG tablet [id number] No Take 1 tablet (0.5 mg total) by mouth daily as needed for anxiety. Cox, Kirsten, MD Taking Active   magnesium oxide (MAG-OX) 400 MG tablet [id number] No Take 1 tablet (400 mg total) by mouth daily. Take 2 (400 mg) daily=800 mg  Patient taking differently: Take 800 mg by mouth daily. Take 2 (400 mg) daily=800 mg   Rolly Salter, MD Taking Active Self  nitroGLYCERIN (NITROSTAT) 0.4 MG SL tablet [id number] No DISSOLVE ONE TABLET UNDER THE TONGUE EVERY 5 MINUTES AS NEEDED FOR CHEST PAIN.  DO NOT EXCEED A TOTAL OF 3 DOSES IN 15 MINUTES Bensimhon, Bevelyn Buckles, MD Taking Active Self  Omega-3 Fatty Acids (FISH OIL PO) [id number] No Take 1 capsule by mouth daily. [provider] Taking Active Self  OXYGEN [id number] No Inhale 2 L into the lungs at bedtime. [provider] Taking Active Self  pantoprazole (PROTONIX) 40 MG tablet [id number] No TAKE ONE TABLET BY MOUTH ONCE DAILY Cox, Kirsten, MD Taking Active Self  polyethylene glycol (MIRALAX / GLYCOLAX) 17 g packet [id number] No Take 17 g by mouth daily. Rolly Salter, MD Taking Active Self  potassium chloride SA (KLOR-CON M) 20 MEQ tablet [id number] No TAKE ONE TABLET BY MOUTH ONCE DAILY  Patient taking differently: Take 20 mEq by mouth 2 (two) times a week.   Blane Ohara, MD Taking Active Self  Probiotic CAPS [id number] No Take 1 capsule by mouth daily. [provider] Taking Active Self  promethazine (PHENERGAN) 25 MG tablet [id number] No Take 1 tablet (25 mg total) by mouth daily as needed. Blane Ohara, MD Taking Active   REPATHA SURECLICK 140 MG/ML Ivory Broad [id number] No inject 140mg  intot HEART SKIN every 14 DAYS Bensimhon, Bevelyn Buckles, MD Taking Active  Self  ticagrelor (BRILINTA) 90 MG TABS tablet [id number] No Take 1 tablet (90 mg total) by mouth 2 (two) times daily. Jacklynn Ganong, FNP Taking Active Self  torsemide 40 MG TABS [id number] No Take 40 mg by mouth daily.  Patient taking differently: Take 60 mg by mouth daily.   Noralee Stain, DO Taking Expired 04/16/23 2359            Med Note Gabriel Carina   Fri Mar 15, 2023  3:04 PM) Taking 4 tablets daily of 20mg , equals 80mg  daily dose as of 03/15/23              Assessment/Plan:   Medication Management: - Currently strategy sufficient to maintain appropriate adherence to prescribed  - Previously offered adherence packaging, patient is amenable to this in the future - Previously facilitated [facility] prescriptions via Lucinda Dell on teams, uncertain of status, will facilitate PCP to send all RX to [facility]   Follow Up Plan: 2-4 weeks  Lynnda Shields, PharmD, BCPS Clinical Pharmacist [facility]  Primary Care

## 2023-04-26 DIAGNOSIS — Z515 Encounter for palliative care: Secondary | ICD-10-CM | POA: Diagnosis not present

## 2023-04-26 DIAGNOSIS — Z89422 Acquired absence of other left toe(s): Secondary | ICD-10-CM | POA: Diagnosis not present

## 2023-04-26 DIAGNOSIS — E1140 Type 2 diabetes mellitus with diabetic neuropathy, unspecified: Secondary | ICD-10-CM | POA: Diagnosis not present

## 2023-04-26 DIAGNOSIS — Z89412 Acquired absence of left great toe: Secondary | ICD-10-CM | POA: Diagnosis not present

## 2023-04-29 ENCOUNTER — Other Ambulatory Visit: Payer: Self-pay

## 2023-04-29 ENCOUNTER — Other Ambulatory Visit: Payer: Self-pay | Admitting: Family Medicine

## 2023-04-29 DIAGNOSIS — I2510 Atherosclerotic heart disease of native coronary artery without angina pectoris: Secondary | ICD-10-CM

## 2023-04-29 DIAGNOSIS — E782 Mixed hyperlipidemia: Secondary | ICD-10-CM

## 2023-04-29 DIAGNOSIS — K5904 Chronic idiopathic constipation: Secondary | ICD-10-CM

## 2023-04-29 MED ORDER — LEVEMIR FLEXPEN 100 UNIT/ML ~~LOC~~ SOPN
55.0000 [IU] | PEN_INJECTOR | Freq: Every day | SUBCUTANEOUS | 1 refills | Status: DC
  Filled 2023-04-29 – 2023-04-30 (×2): qty 18, 32d supply, fill #0

## 2023-04-29 MED ORDER — EZETIMIBE 10 MG PO TABS: 10.0000 mg | ORAL_TABLET | Freq: Every evening | ORAL | 3 refills | Status: DC

## 2023-04-29 MED ORDER — PANTOPRAZOLE SODIUM 40 MG PO TBEC: 40.0000 mg | DELAYED_RELEASE_TABLET | Freq: Every day | ORAL | 0 refills | Status: DC

## 2023-04-29 MED ORDER — POTASSIUM CHLORIDE CRYS ER 20 MEQ PO TBCR
40.0000 meq | EXTENDED_RELEASE_TABLET | Freq: Every day | ORAL | 1 refills | Status: DC
  Filled 2023-04-29 – 2023-04-30 (×2): qty 180, 90d supply, fill #0
  Filled 2023-08-14: qty 180, 90d supply, fill #1

## 2023-04-29 MED ORDER — TORSEMIDE 20 MG PO TABS
60.0000 mg | ORAL_TABLET | Freq: Every day | ORAL | 1 refills | Status: DC
  Filled 2023-04-29 – 2023-04-30 (×2): qty 270, 90d supply, fill #0

## 2023-04-29 MED ORDER — EPINEPHRINE 0.3 MG/0.3ML IJ SOAJ
INTRAMUSCULAR | 3 refills | Status: DC
  Filled 2023-04-29: qty 4, 10d supply, fill #0
  Filled 2023-08-14: qty 4, 10d supply, fill #1

## 2023-04-29 MED ORDER — PROMETHAZINE HCL 25 MG PO TABS
25.0000 mg | ORAL_TABLET | Freq: Every day | ORAL | 1 refills | Status: DC | PRN
  Filled 2023-04-29 – 2023-05-08 (×3): qty 90, 90d supply, fill #0
  Filled 2023-08-14: qty 90, 90d supply, fill #1

## 2023-04-29 MED ORDER — ALLOPURINOL 100 MG PO TABS
50.0000 mg | ORAL_TABLET | Freq: Every day | ORAL | 1 refills | Status: DC
  Filled 2023-04-29 – 2023-04-30 (×2): qty 50, 100d supply, fill #0
  Filled 2023-08-04: qty 50, 100d supply, fill #1
  Filled 2023-09-29 – 2023-11-14 (×2): qty 50, 100d supply, fill #2

## 2023-04-29 MED ORDER — CARVEDILOL 12.5 MG PO TABS
12.5000 mg | ORAL_TABLET | Freq: Two times a day (BID) | ORAL | 1 refills | Status: DC
  Filled 2023-04-29 – 2023-04-30 (×2): qty 180, 90d supply, fill #0
  Filled 2023-08-04: qty 180, 90d supply, fill #1

## 2023-04-29 MED ORDER — TICAGRELOR 90 MG PO TABS
90.0000 mg | ORAL_TABLET | Freq: Two times a day (BID) | ORAL | 1 refills | Status: DC
  Filled 2023-04-29 – 2023-09-15 (×5): qty 180, 90d supply, fill #0
  Filled 2023-12-11: qty 180, 90d supply, fill #1

## 2023-04-29 MED ORDER — MAGNESIUM OXIDE 400 MG PO TABS: 800.0000 mg | ORAL_TABLET | Freq: Every day | ORAL | 1 refills | Status: DC

## 2023-04-29 MED ORDER — NITROGLYCERIN 0.4 MG SL SUBL
SUBLINGUAL_TABLET | SUBLINGUAL | 3 refills | Status: DC
  Filled 2023-04-29: qty 50, 30d supply, fill #0
  Filled 2023-04-30: qty 50, 25d supply, fill #0
  Filled 2023-08-13: qty 50, 25d supply, fill #1
  Filled 2023-11-14: qty 50, 25d supply, fill #2
  Filled 2023-12-11: qty 50, 25d supply, fill #3

## 2023-04-29 MED ORDER — TICAGRELOR 90 MG PO TABS: 90.0000 mg | ORAL_TABLET | Freq: Two times a day (BID) | ORAL | 6 refills | Status: DC

## 2023-04-29 MED ORDER — LINACLOTIDE 145 MCG PO CAPS
145.0000 ug | ORAL_CAPSULE | Freq: Every day | ORAL | 1 refills | Status: DC
  Filled 2023-04-29 – 2023-04-30 (×2): qty 90, 90d supply, fill #0
  Filled 2023-08-14: qty 90, 90d supply, fill #1

## 2023-04-29 MED ORDER — BUSPIRONE HCL 10 MG PO TABS
10.0000 mg | ORAL_TABLET | Freq: Three times a day (TID) | ORAL | 1 refills | Status: DC
  Filled 2023-04-29 – 2023-04-30 (×2): qty 270, 90d supply, fill #0

## 2023-04-29 MED ORDER — REPATHA SURECLICK 140 MG/ML ~~LOC~~ SOAJ
140.0000 mg | SUBCUTANEOUS | 1 refills | Status: DC
  Filled 2023-04-29 – 2023-04-30 (×2): qty 6, 84d supply, fill #0
  Filled 2023-08-14: qty 6, 84d supply, fill #1

## 2023-04-29 MED ORDER — LORAZEPAM 0.5 MG PO TABS
0.5000 mg | ORAL_TABLET | Freq: Every day | ORAL | 1 refills | Status: DC | PRN
  Filled 2023-04-29 – 2023-05-08 (×3): qty 90, 90d supply, fill #0
  Filled 2023-08-04: qty 90, 90d supply, fill #1

## 2023-04-29 MED ORDER — POLYETHYLENE GLYCOL 3350 17 GM/SCOOP PO POWD
17.0000 g | Freq: Every day | ORAL | 1 refills | Status: DC
  Filled 2023-04-29: qty 238, 14d supply, fill #0

## 2023-04-29 MED ORDER — NOVOLOG FLEXPEN 100 UNIT/ML ~~LOC~~ SOPN
PEN_INJECTOR | SUBCUTANEOUS | 3 refills | Status: DC
Start: 2023-04-29 — End: 2024-05-07
  Filled 2023-04-29 – 2023-04-30 (×2): qty 15, 50d supply, fill #0
  Filled 2023-08-14: qty 15, 50d supply, fill #1
  Filled 2023-11-14: qty 15, 50d supply, fill #2
  Filled 2024-02-08: qty 15, 50d supply, fill #3

## 2023-04-29 MED ORDER — PANTOPRAZOLE SODIUM 40 MG PO TBEC
40.0000 mg | DELAYED_RELEASE_TABLET | Freq: Every day | ORAL | 1 refills | Status: DC
  Filled 2023-04-29 – 2023-08-04 (×3): qty 90, 90d supply, fill #0
  Filled 2023-11-14: qty 90, 90d supply, fill #1

## 2023-04-29 MED ORDER — EZETIMIBE 10 MG PO TABS
10.0000 mg | ORAL_TABLET | Freq: Every evening | ORAL | 1 refills | Status: DC
  Filled 2023-04-29 – 2023-08-14 (×3): qty 90, 90d supply, fill #0

## 2023-04-29 MED ORDER — ASPIRIN 81 MG PO TBEC: 81.0000 mg | DELAYED_RELEASE_TABLET | Freq: Every day | ORAL | 12 refills | Status: DC

## 2023-04-29 MED ORDER — HYDROCODONE-ACETAMINOPHEN 7.5-325 MG PO TABS
1.0000 | ORAL_TABLET | Freq: Three times a day (TID) | ORAL | 0 refills | Status: DC | PRN
  Filled 2023-04-29 – 2023-05-08 (×3): qty 90, 30d supply, fill #0

## 2023-04-29 MED ORDER — BUSPIRONE HCL 10 MG PO TABS: 10.0000 mg | ORAL_TABLET | Freq: Three times a day (TID) | ORAL | 2 refills | Status: DC

## 2023-04-30 ENCOUNTER — Other Ambulatory Visit (HOSPITAL_COMMUNITY): Payer: Self-pay

## 2023-04-30 ENCOUNTER — Other Ambulatory Visit: Payer: Self-pay

## 2023-04-30 ENCOUNTER — Encounter: Payer: Self-pay | Admitting: Oncology

## 2023-04-30 DIAGNOSIS — F411 Generalized anxiety disorder: Secondary | ICD-10-CM | POA: Diagnosis not present

## 2023-05-01 ENCOUNTER — Encounter (HOSPITAL_COMMUNITY): Payer: Self-pay | Admitting: Pharmacist

## 2023-05-01 ENCOUNTER — Other Ambulatory Visit: Payer: Self-pay

## 2023-05-01 ENCOUNTER — Other Ambulatory Visit (HOSPITAL_COMMUNITY): Payer: Self-pay

## 2023-05-02 ENCOUNTER — Other Ambulatory Visit (HOSPITAL_COMMUNITY): Payer: Self-pay

## 2023-05-02 ENCOUNTER — Encounter: Payer: Self-pay | Admitting: Oncology

## 2023-05-02 MED ORDER — LANTUS SOLOSTAR 100 UNIT/ML ~~LOC~~ SOPN
60.0000 [IU] | PEN_INJECTOR | Freq: Every day | SUBCUTANEOUS | 5 refills | Status: DC
  Filled 2023-05-02: qty 30, 50d supply, fill #0
  Filled 2023-08-13: qty 30, 50d supply, fill #1

## 2023-05-06 ENCOUNTER — Telehealth (HOSPITAL_COMMUNITY): Payer: Self-pay | Admitting: Adult Health

## 2023-05-06 NOTE — Telephone Encounter (Signed)
Called to remind to send [facility] reading.   Nury appreciated the call and will send a reading.   Tyreshia Ingman NP-C 2:15 PM

## 2023-05-07 ENCOUNTER — Other Ambulatory Visit (HOSPITAL_COMMUNITY): Payer: Self-pay

## 2023-05-08 ENCOUNTER — Ambulatory Visit: Payer: Medicaid Other | Admitting: Podiatry

## 2023-05-08 ENCOUNTER — Other Ambulatory Visit: Payer: Self-pay

## 2023-05-08 ENCOUNTER — Other Ambulatory Visit (HOSPITAL_COMMUNITY): Payer: Self-pay

## 2023-05-09 ENCOUNTER — Telehealth: Payer: Self-pay

## 2023-05-09 ENCOUNTER — Encounter: Payer: Self-pay | Admitting: Family Medicine

## 2023-05-09 NOTE — Telephone Encounter (Signed)
Per Dr. Sedalia Muta patient's allopurinol dosage was changed due to her kidney function. If she is having a flare up recommend she come in for appointment.  Patient made aware and appointment was made on Tuesday with Dr. Faylene Kurtz

## 2023-05-09 NOTE — Telephone Encounter (Signed)
Patient called stating she believes her allopurinol dose needs to be increased as it is not helping.

## 2023-05-09 NOTE — Telephone Encounter (Signed)
Addressed. Dr. Tobie Poet

## 2023-05-13 ENCOUNTER — Other Ambulatory Visit: Payer: Self-pay | Admitting: *Deleted

## 2023-05-13 ENCOUNTER — Other Ambulatory Visit: Payer: Self-pay

## 2023-05-13 NOTE — Patient Outreach (Signed)
Care Management   Visit Note  05/13/2023 Name: Dana [name] MRN: [id number] DOB: 1961/01/28  Subjective: Dana [name] is a 62 y.o. year old female who is a primary care patient of Cox, Kirsten, MD. The Care Management team was consulted for assistance.      Engaged with patient spoke with patient by telephone.    Goals Addressed             This Visit's Progress    RNCM Care Management Expected Outcome:  Monitor, Self-Manage and Reduce Symptoms of Diabetes       Current Barriers:  Chronic Disease Management support and education needs related to effective management of DM Lab Results  Component Value Date   HGBA1C 8.5 (H) 02/13/2023  At endocrinologist on 01-11-2023 A1C is 8.1%   Planned Interventions: Provided education to patient about basic DM disease process. Patient states her blood sugars are stable and her morning blood sugar was 99 today. Reviewed medications with patient and discussed importance of medication adherence. Patient states compliance with medications. Continues to work with Pharm D. Reviewed prescribed diet with patient heart healthy/ADA diet. Is compliant of heart healthy/ADA diet. The patients states her eating habits are much better now that her mom is living with her. She states she is able to control what she is eating and hopeful that her A1C continues to improve now. Counseled on importance of regular laboratory monitoring as prescribed. Has regular lab work done. Review of A1C levels. The patient has an A1C level higher than normal. The patient knows and is continuing to work with the endocrinologist and her providers to get her A1C in range.  Discussed plans with patient for ongoing care management follow up and provided patient with direct contact information for care management team;      Provided patient with written educational materials related to hypo and hyperglycemia and importance of correct treatment. She is having some fluctuations in her  blood sugars. States she is monitoring her blood sugars. Has a continuous glucose reader.;      Reviewed scheduled/upcoming provider appointments including: 05-14-2023 with PCP Advised patient, providing education and rationale, to check cbg twice daily and when you have symptoms of low or high blood sugar and record. Has a continuous reader.       call provider for findings outside established parameters;       Referral made to community resources care guide team for assistance with food insecurities and resources in her area to help with expressed needs. Has talked with care guides and had resources mailed.  ;      Review of patient status, including review of consultants reports, relevant laboratory and other test results, and medications completed;       Advised patient to discuss changes in DM, questions, and concerns with provider;      Screening for signs and symptoms of depression related to chronic disease state;        Assessed social determinant of health barriers;         Symptom Management: Take medications as prescribed   Attend all scheduled provider appointments Call provider office for new concerns or questions  call the Suicide and Crisis Lifeline: 988 call the [place] National Suicide Prevention Lifeline: 315-365-5777 or TTY: 302-732-3589 TTY 205-738-0965) to talk to a trained counselor call 1-800-273-TALK (toll free, 24 hour hotline) if experiencing a Mental Health or Behavioral Health Crisis  keep appointment with eye doctor check feet daily for cuts, sores or redness  trim toenails straight across manage portion size wash and dry feet carefully every day wear comfortable, cotton socks wear comfortable, well-fitting shoes  Follow Up Plan: Telephone follow up appointment with care management team member scheduled for: 07-01-2023 at 345 pm       RNCM Care Management Expected Outcome:  Monitor, Self-Manage and Reduce Symptoms of: Depression, Anxiety, Stress       Current  Barriers:  Knowledge Deficits related to resources available to help with expressed needs at this time due to her husband losing his job and his unemployment has expired Care Coordination needs related to resources to help meet financial needs at this time in a patient with stress, anxiety, and depression  Chronic Disease Management support and education needs related to effective management of stress, anxiety, and depression Financial Constraints.   Planned Interventions: Evaluation of current treatment plan related to depression, stress, and anxiety and patient's adherence to plan as established by provider. The patient states she is doing very good today. She states that her mother is now living with her full time and she has been stressed lately but states she is managing ok and she does talk with her counselor and declined referral for LCSW at this time. Will continue to monitor for changes and new needs. Discussed self care and making sure her needs are being met.  Reviewed medications with patient and discussed compliance. The patient is compliant with medications.  Provided patient with care guide and SW support as well as support from the interdisciplinary team for management of stress, anxiety, and depression  educational materials related to effective management of stress, anxiety, and depression.  Reviewed scheduled/upcoming provider appointments including 05-14-2023 with PCP for gout flair Care Guide referral for community resources for [facility] for food insecurities and assistance with utilities. Has information from the care guides  Social Work referral for education and support and resources for effective management of stress, anxiety, and depression. The patient continues to work with the SW for assistance, education, and support.  Discussed plans with patient for ongoing care management follow up and provided patient with direct contact information for care management  team Advised patient to discuss changes in mood, increased anxiety, depression, stress, or other mental health needs.  with provider Screening for signs and symptoms of depression related to chronic disease state  Assessed social determinant of health barriers  Symptom Management: Take medications as prescribed   Attend all scheduled provider appointments Call provider office for new concerns or questions  call the Suicide and Crisis Lifeline: 988 call the [place] National Suicide Prevention Lifeline: (713)770-5349 or TTY: 3304446470 TTY 323 417 6950) to talk to a trained counselor call 1-800-273-TALK (toll free, 24 hour hotline) if experiencing a Mental Health or Behavioral Health Crisis   Follow Up Plan: Telephone follow up appointment with care management team member scheduled for: 07-01-2023 at 345 pm       RNCM Care Management Expected Outcome:  Monitor, Self-Manage, and Reduce Symptoms of Hypertension       Current Barriers:  Chronic Disease Management support and education needs related to effective management of HTN BP Readings from Last 3 Encounters:  05/13/23 (!) 127/59  04/16/23 126/62  04/04/23 120/70   Wt Readings from Last 3 Encounters:  05/13/23 233 lb (105.7 kg)  04/16/23 235 lb (106.6 kg)  04/04/23 226 lb (102.5 kg)    Planned Interventions: Evaluation of current treatment plan related to hypertension self management and patient's adherence to plan as established by provider. The  patient states her blood pressures have been has been stable with last BP taken this morning, 127/59. She regularly monitor her blood pressures. Education and support provided.  Provided education to patient re: stroke prevention, s/s of heart attack and stroke. The patient has high risk factors for HA and stroke. Had 2 heart attacks in 2022. Denies any new concerns with CP at this time.. Reflective listening. Praised for being proactive in her care and seeking help; Reviewed prescribed  diet heart healthy/ADA diet. Review and education  Reviewed medications with patient and discussed importance of compliance. The patient states compliance with medications. Denies any medication needs at this time. 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 Discussed plans with patient for ongoing care management follow up and provided patient with direct contact information for care management team; Advised patient, providing education and rationale, to monitor blood pressure daily and record, calling PCP for findings outside established parameters.  Reviewed scheduled/upcoming provider appointments including: 05-14-2023 for gout flair with PCP Advised patient to discuss changes in her blood pressures or heart health  with provider; Provided education on prescribed diet heart healthy/ADA diet.  Discussed complications of poorly controlled blood pressure such as heart disease, stroke, circulatory complications, vision complications, kidney impairment, sexual dysfunction;  Screening for signs and symptoms of depression related to chronic disease state;  Assessed social determinant of health barriers;   Symptom Management: Take medications as prescribed   Attend all scheduled provider appointments Call provider office for new concerns or questions  call the Suicide and Crisis Lifeline: 988 call the [place] National Suicide Prevention Lifeline: 640-046-3911 or TTY: 260 371 6734 TTY 901-884-3911) to talk to a trained counselor call 1-800-273-TALK (toll free, 24 hour hotline) if experiencing a Mental Health or Behavioral Health Crisis  check blood pressure 3 times per week write blood pressure results in a log or diary learn about high blood pressure keep a blood pressure log take blood pressure log to all doctor appointments call doctor for signs and symptoms of high blood pressure develop an action plan for high blood pressure keep all doctor appointments take medications for blood pressure exactly as  prescribed report new symptoms to your doctor  Follow Up Plan: Telephone follow up appointment with care management team member scheduled for: 07-01-2023 at 345 pm         :     Consent to Services:  Patient was given information about care management services, agreed to services, and gave verbal consent to participate.   Plan: Telephone follow up appointment with care management team member scheduled for: 07-01-2023 at 3:45 pm  Danise Edge, BSN RN RN Care Manager  [facility]  Ambulatory Care Management  Direct Number: [phone]

## 2023-05-13 NOTE — Patient Instructions (Signed)
Visit Information  Thank you for taking time to visit with me today. Please don't hesitate to contact me if I can be of assistance to you before our next scheduled telephone appointment.  Following are the goals we discussed today:   Goals Addressed             This Visit's Progress    RNCM Care Management Expected Outcome:  Monitor, Self-Manage and Reduce Symptoms of Diabetes       Current Barriers:  Chronic Disease Management support and education needs related to effective management of DM Lab Results  Component Value Date   HGBA1C 8.5 (H) 02/13/2023  At endocrinologist on 01-11-2023 A1C is 8.1%   Planned Interventions: Provided education to patient about basic DM disease process. Patient states her blood sugars are stable and her morning blood sugar was 99 today. Reviewed medications with patient and discussed importance of medication adherence. Patient states compliance with medications. Continues to work with Pharm D. Reviewed prescribed diet with patient heart healthy/ADA diet. Is compliant of heart healthy/ADA diet. The patients states her eating habits are much better now that her mom is living with her. She states she is able to control what she is eating and hopeful that her A1C continues to improve now. Counseled on importance of regular laboratory monitoring as prescribed. Has regular lab work done. Review of A1C levels. The patient has an A1C level higher than normal. The patient knows and is continuing to work with the endocrinologist and her providers to get her A1C in range.  Discussed plans with patient for ongoing care management follow up and provided patient with direct contact information for care management team;      Provided patient with written educational materials related to hypo and hyperglycemia and importance of correct treatment. She is having some fluctuations in her blood sugars. States she is monitoring her blood sugars. Has a continuous glucose reader.;       Reviewed scheduled/upcoming provider appointments including: 05-14-2023 with PCP Advised patient, providing education and rationale, to check cbg twice daily and when you have symptoms of low or high blood sugar and record. Has a continuous reader.       call provider for findings outside established parameters;       Referral made to community resources care guide team for assistance with food insecurities and resources in her area to help with expressed needs. Has talked with care guides and had resources mailed.  ;      Review of patient status, including review of consultants reports, relevant laboratory and other test results, and medications completed;       Advised patient to discuss changes in DM, questions, and concerns with provider;      Screening for signs and symptoms of depression related to chronic disease state;        Assessed social determinant of health barriers;         Symptom Management: Take medications as prescribed   Attend all scheduled provider appointments Call provider office for new concerns or questions  call the Suicide and Crisis Lifeline: 988 call the [place] National Suicide Prevention Lifeline: (321) 388-6598 or TTY: (424) 430-7143 TTY 902 288 0825) to talk to a trained counselor call 1-800-273-TALK (toll free, 24 hour hotline) if experiencing a Mental Health or Behavioral Health Crisis  keep appointment with eye doctor check feet daily for cuts, sores or redness trim toenails straight across manage portion size wash and dry feet carefully every day wear comfortable, cotton socks wear  comfortable, well-fitting shoes  Follow Up Plan: Telephone follow up appointment with care management team member scheduled for: 07-01-2023 at 345 pm       RNCM Care Management Expected Outcome:  Monitor, Self-Manage and Reduce Symptoms of: Depression, Anxiety, Stress       Current Barriers:  Knowledge Deficits related to resources available to help with expressed needs at  this time due to her husband losing his job and his unemployment has expired Care Coordination needs related to resources to help meet financial needs at this time in a patient with stress, anxiety, and depression  Chronic Disease Management support and education needs related to effective management of stress, anxiety, and depression Financial Constraints.   Planned Interventions: Evaluation of current treatment plan related to depression, stress, and anxiety and patient's adherence to plan as established by provider. The patient states she is doing very good today. She states that her mother is now living with her full time and she has been stressed lately but states she is managing ok and she does talk with her counselor and declined referral for LCSW at this time. Will continue to monitor for changes and new needs. Discussed self care and making sure her needs are being met.  Reviewed medications with patient and discussed compliance. The patient is compliant with medications.  Provided patient with care guide and SW support as well as support from the interdisciplinary team for management of stress, anxiety, and depression  educational materials related to effective management of stress, anxiety, and depression.  Reviewed scheduled/upcoming provider appointments including 05-14-2023 with PCP for gout flair Care Guide referral for community resources for [facility] for food insecurities and assistance with utilities. Has information from the care guides  Social Work referral for education and support and resources for effective management of stress, anxiety, and depression. The patient continues to work with the SW for assistance, education, and support.  Discussed plans with patient for ongoing care management follow up and provided patient with direct contact information for care management team Advised patient to discuss changes in mood, increased anxiety, depression, stress, or other mental  health needs.  with provider Screening for signs and symptoms of depression related to chronic disease state  Assessed social determinant of health barriers  Symptom Management: Take medications as prescribed   Attend all scheduled provider appointments Call provider office for new concerns or questions  call the Suicide and Crisis Lifeline: 988 call the [place] National Suicide Prevention Lifeline: 318-100-3280 or TTY: (401) 383-0793 TTY (939)478-3678) to talk to a trained counselor call 1-800-273-TALK (toll free, 24 hour hotline) if experiencing a Mental Health or Behavioral Health Crisis   Follow Up Plan: Telephone follow up appointment with care management team member scheduled for: 07-01-2023 at 345 pm       RNCM Care Management Expected Outcome:  Monitor, Self-Manage, and Reduce Symptoms of Hypertension       Current Barriers:  Chronic Disease Management support and education needs related to effective management of HTN BP Readings from Last 3 Encounters:  05/13/23 (!) 127/59  04/16/23 126/62  04/04/23 120/70   Wt Readings from Last 3 Encounters:  05/13/23 233 lb (105.7 kg)  04/16/23 235 lb (106.6 kg)  04/04/23 226 lb (102.5 kg)    Planned Interventions: Evaluation of current treatment plan related to hypertension self management and patient's adherence to plan as established by provider. The patient states her blood pressures have been has been stable with last BP taken this morning, 127/59. She regularly  monitor her blood pressures. Education and support provided.  Provided education to patient re: stroke prevention, s/s of heart attack and stroke. The patient has high risk factors for HA and stroke. Had 2 heart attacks in 2022. Denies any new concerns with CP at this time.. Reflective listening. Praised for being proactive in her care and seeking help; Reviewed prescribed diet heart healthy/ADA diet. Review and education  Reviewed medications with patient and discussed  importance of compliance. The patient states compliance with medications. Denies any medication needs at this time. 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 Discussed plans with patient for ongoing care management follow up and provided patient with direct contact information for care management team; Advised patient, providing education and rationale, to monitor blood pressure daily and record, calling PCP for findings outside established parameters.  Reviewed scheduled/upcoming provider appointments including: 05-14-2023 for gout flair with PCP Advised patient to discuss changes in her blood pressures or heart health  with provider; Provided education on prescribed diet heart healthy/ADA diet.  Discussed complications of poorly controlled blood pressure such as heart disease, stroke, circulatory complications, vision complications, kidney impairment, sexual dysfunction;  Screening for signs and symptoms of depression related to chronic disease state;  Assessed social determinant of health barriers;   Symptom Management: Take medications as prescribed   Attend all scheduled provider appointments Call provider office for new concerns or questions  call the Suicide and Crisis Lifeline: 988 call the [place] National Suicide Prevention Lifeline: 832-230-2764 or TTY: 2397346058 TTY 639 595 7207) to talk to a trained counselor call 1-800-273-TALK (toll free, 24 hour hotline) if experiencing a Mental Health or Behavioral Health Crisis  check blood pressure 3 times per week write blood pressure results in a log or diary learn about high blood pressure keep a blood pressure log take blood pressure log to all doctor appointments call doctor for signs and symptoms of high blood pressure develop an action plan for high blood pressure keep all doctor appointments take medications for blood pressure exactly as prescribed report new symptoms to your doctor  Follow Up Plan: Telephone follow up appointment with  care management team member scheduled for: 07-01-2023 at 345 pm           Our next appointment is by telephone on 07-01-2023 at 3:45 pm  Please call the care guide team at [phone] if you need to cancel or reschedule your appointment.   If you are experiencing a Mental Health or Behavioral Health Crisis or need someone to talk to, please call the Suicide and Crisis Lifeline: 988 call the [place] National Suicide Prevention Lifeline: 802 077 5910 or TTY: (272)162-2124 TTY 904-586-9388) to talk to a trained counselor call 1-800-273-TALK (toll free, 24 hour hotline) call 911   Patient verbalizes understanding of instructions and care plan provided today and agrees to view in MyChart. Active MyChart status and patient understanding of how to access instructions and care plan via MyChart confirmed with patient.     Telephone follow up appointment with care management team member scheduled for: 07-01-2023 at 3:45 pm  Danise Edge, BSN RN RN Care Manager  [facility]  Ambulatory Care Management  Direct Number: [phone]

## 2023-05-14 ENCOUNTER — Ambulatory Visit (INDEPENDENT_AMBULATORY_CARE_PROVIDER_SITE_OTHER): Payer: PPO

## 2023-05-14 VITALS — BP 138/64 | HR 75 | Temp 98.1°F | Resp 16 | Ht 65.0 in | Wt 230.8 lb

## 2023-05-14 DIAGNOSIS — M79641 Pain in right hand: Secondary | ICD-10-CM

## 2023-05-14 DIAGNOSIS — E1042 Type 1 diabetes mellitus with diabetic polyneuropathy: Secondary | ICD-10-CM

## 2023-05-14 DIAGNOSIS — M1A9XX0 Chronic gout, unspecified, without tophus (tophi): Secondary | ICD-10-CM | POA: Diagnosis not present

## 2023-05-14 DIAGNOSIS — M79642 Pain in left hand: Secondary | ICD-10-CM | POA: Diagnosis not present

## 2023-05-14 DIAGNOSIS — F411 Generalized anxiety disorder: Secondary | ICD-10-CM | POA: Diagnosis not present

## 2023-05-14 DIAGNOSIS — N184 Chronic kidney disease, stage 4 (severe): Secondary | ICD-10-CM

## 2023-05-14 DIAGNOSIS — E1142 Type 2 diabetes mellitus with diabetic polyneuropathy: Secondary | ICD-10-CM | POA: Diagnosis not present

## 2023-05-14 NOTE — Assessment & Plan Note (Addendum)
Reports neuropathy in her legs but not in her hands. She was reportedly on gabapentin for this but stopped taking it.  States her neuropathy has not been bothering her lately.  Advised that if her uric acid comes back normal and if she continues to have the burning pain in her hands, we could trial putting her back on gabapentin after renally dosing it for her CKD 4.  She agrees to the plan

## 2023-05-14 NOTE — Assessment & Plan Note (Signed)
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 On allopurinol, 50 mg daily at this time due to her CKD 4  She does see a nephrologist.  Plan: If her uric acid comes back elevated, will offer her colchicine 2 tablets of 0.6 mg followed by 1 tablet of 0.6 mg an hour later for her gout flare. -Also advised to reach out to her nephrologist for recommendations. -To avoid foods that trigger gout flares. -To take Tylenol as needed for any pain or discomfort.  She does have Vicodin to take.

## 2023-05-14 NOTE — Patient Instructions (Signed)
Let us get a uric acid level to check if this is gout Repeat your kidney function and A1c Please call the nephrologist and ask for recommendation Apply heat or ice, continue tylenol, vicodin If this is not gout and if the burning pain does not go away, we could assume that it is neuropathy from your diabetes and consider starting you back on a low dose gabapentin.

## 2023-05-14 NOTE — Assessment & Plan Note (Signed)
CKD stage IV secondary to chronic diabetes type 1 which is uncontrolled with complications.   Follows up with nephrology.

## 2023-05-14 NOTE — Progress Notes (Signed)
Acute Office Visit  Subjective:    Patient ID: Dana [name], female    DOB: Dec 12, 1960, 62 y.o.   MRN: [id number]  Chief Complaint  Patient presents with   Arthritis    HPI: Patient is in today for a problem visit.  States she has type 1 diabetes diagnosed almost 40 years ago, but all her chart reflects type 2 diabetes.  Diagnosed with GOUT 4 years ago. Has been on Allopurinol 50 mg ONCE DAILY.  States the dose was lowered about a week and half ago.  GOUT FLARE STARTED 5 DAYS AGO Reports pain in her hands. Hard to make a fist. Unable to crochet Had gout in her hands before.  Has been taking tylenol arthritis and vicodin.  LAST A1C WAS 8.6 about 2 months ago. Follows with Endocrinology in [place] and NEPHROLOGY in [place].   Past Medical History:  Diagnosis Date   Acquired absence of left great toe (HCC)    Acquired absence of other left toe(s) (HCC)    ACS (acute coronary syndrome) (HCC)    Anemia    Anoxic brain injury (HCC)    Anxiety    CAD (coronary artery disease)    DES mLAD 04/07/15; DES dRCA & DES pPDA 08/30/16; DES mCX & PTCA OM1 09/29/20   Chronic diastolic (congestive) heart failure (HCC)    Chronic pain    Chronic systolic (congestive) heart failure (HCC)    CKD (chronic kidney disease)    Contrast dye induced nephropathy, possible 09/03/2020   COPD (chronic obstructive pulmonary disease) (HCC)    COVID-19 virus infection 03/03/2023   Diabetes (HCC)type 1    Diastolic CHF (HCC)    Dyspnea    Family history of breast cancer 10/23/2021   Gastro-esophageal reflux disease without esophagitis    Hyperlipidemia    Hypertension    Hypertensive heart disease with heart failure (HCC)    Hypoxemia    Idiopathic gout, multiple sites    Leukocytosis 03/29/2022   Localized edema    Low back pain    Mitral regurgitation    Mitral regurgitation    Mixed hyperlipidemia    Mixed incontinence    Morbid (severe) obesity due to excess calories (HCC)     Neuromuscular disorder (HCC)    Neuropathy    Non-ST elevation (NSTEMI) myocardial infarction (HCC)    08/29/20, 09/29/20   OSA (obstructive sleep apnea) 09/03/2020   Other specified hypothyroidism    PAF (paroxysmal atrial fibrillation) (HCC)    s/p pulmonary vein isolation by cryoablation 10/04/15 Dr. Rudolpho Sevin in [facility]   PEA (Pulseless electrical activity) [facility])    ~ 01/13/21 at [facility] during admission DKA, volume overload, possible CAP, hypoxia s/p ACLS with ROSC ~ 10-15   Pneumonia    Primary insomnia    Rheumatoid arthritis (HCC)    Stroke (HCC)    Thyroid disease    Type 1 diabetes mellitus (HCC)    Vitamin D deficiency     Past Surgical History:  Procedure Laterality Date   ABDOMINAL HYSTERECTOMY     Partial- Still has both ovaries   CARDIOVASCULAR STRESS TEST  08/13/2014   Nuclear; Normal   CARPAL TUNNEL RELEASE     CATARACT EXTRACTION, BILATERAL     CESAREAN SECTION     CHOLECYSTECTOMY     CORONARY ANGIOPLASTY WITH STENT PLACEMENT     3 blockages/ 1 stent   CORONARY PRESSURE/FFR STUDY N/A 09/07/2022   Procedure: INTRAVASCULAR PRESSURE WIRE/FFR STUDY;  Surgeon:  Swaziland, Peter M, MD;  Location: [facility] INVASIVE CV LAB;  Service: Cardiovascular;  Laterality: N/A;   CORONARY STENT INTERVENTION N/A 09/29/2020   Procedure: CORONARY STENT INTERVENTION;  Surgeon: Tonny Bollman, MD;  Location: [facility] INVASIVE CV LAB;  Service: Cardiovascular;  Laterality: N/A;  CFX   CORONARY STENT INTERVENTION N/A 09/07/2022   Procedure: CORONARY STENT INTERVENTION;  Surgeon: Swaziland, Peter M, MD;  Location: [facility] INVASIVE CV LAB;  Service: Cardiovascular;  Laterality: N/A;   CORONARY STENT INTERVENTION N/A 02/13/2023   Procedure: CORONARY STENT INTERVENTION;  Surgeon: Kathleene Hazel, MD;  Location: [facility] INVASIVE CV LAB;  Service: Cardiovascular;  Laterality: N/A;  LAD   CORONARY ULTRASOUND/IVUS N/A 02/13/2023   Procedure: Coronary Ultrasound/IVUS;  Surgeon: Kathleene Hazel, MD;   Location: [facility] INVASIVE CV LAB;  Service: Cardiovascular;  Laterality: N/A;   CORONARY/GRAFT ACUTE MI REVASCULARIZATION N/A 12/08/2022   Procedure: Coronary/Graft Acute MI Revascularization;  Surgeon: Marykay Lex, MD;  Location: [facility] INVASIVE CV LAB;  Service: Cardiovascular;  Laterality: N/A;   EYE SURGERY     LEFT HEART CATH AND CORONARY ANGIOGRAPHY N/A 09/29/2020   Procedure: LEFT HEART CATH AND CORONARY ANGIOGRAPHY;  Surgeon: Tonny Bollman, MD;  Location: [facility] INVASIVE CV LAB;  Service: Cardiovascular;  Laterality: N/A;   LEFT HEART CATH AND CORONARY ANGIOGRAPHY N/A 09/07/2022   Procedure: LEFT HEART CATH AND CORONARY ANGIOGRAPHY;  Surgeon: Swaziland, Peter M, MD;  Location: [facility] INVASIVE CV LAB;  Service: Cardiovascular;  Laterality: N/A;   LEFT HEART CATH AND CORONARY ANGIOGRAPHY N/A 12/08/2022   Procedure: LEFT HEART CATH AND CORONARY ANGIOGRAPHY;  Surgeon: Marykay Lex, MD;  Location: [facility] INVASIVE CV LAB;  Service: Cardiovascular;  Laterality: N/A;   LEFT HEART CATH AND CORONARY ANGIOGRAPHY N/A 02/13/2023   Procedure: LEFT HEART CATH AND CORONARY ANGIOGRAPHY;  Surgeon: Kathleene Hazel, MD;  Location: [facility] INVASIVE CV LAB;  Service: Cardiovascular;  Laterality: N/A;   RIGHT HEART CATH N/A 04/27/2021   Procedure: RIGHT HEART CATH;  Surgeon: Dolores Patty, MD;  Location: [facility] INVASIVE CV LAB;  Service: Cardiovascular;  Laterality: N/A;   RIGHT/LEFT HEART CATH AND CORONARY ANGIOGRAPHY N/A 01/07/2017   Procedure: Right/Left Heart Cath and Coronary Angiography;  Surgeon: Laurey Morale, MD;  Location: [facility] INVASIVE CV LAB;  Service: Cardiovascular;  Laterality: N/A;   ROTATOR CUFF REPAIR     TEE WITHOUT CARDIOVERSION N/A 04/28/2021   Procedure: TRANSESOPHAGEAL ECHOCARDIOGRAM (TEE);  Surgeon: Dolores Patty, MD;  Location: [facility] ENDOSCOPY;  Service: Cardiovascular;  Laterality: N/A;   TEE WITHOUT CARDIOVERSION N/A 05/18/2021   Procedure: TRANSESOPHAGEAL ECHOCARDIOGRAM (TEE);  Surgeon:  Orbie Pyo, MD;  Location: [facility] INVASIVE CV LAB;  Service: Cardiovascular;  Laterality: N/A;   TOE AMPUTATION Left    TRANSCATHETER MITRAL EDGE TO EDGE REPAIR N/A 05/18/2021   Procedure: MITRAL VALVE REPAIR;  Surgeon: Orbie Pyo, MD;  Location: [facility] INVASIVE CV LAB;  Service: Cardiovascular;  Laterality: N/A;   US ECHOCARDIOGRAPHY  05/2017   Normal    Family History  Problem Relation Age of Onset   Breast cancer Mother 25       contralateral breast ca dx 71   Coronary artery disease Mother    Hypertension Mother    Hyperlipidemia Mother    Diabetes type II Mother    Lung cancer Mother 62   Diabetes Father    Hypertension Father    Mental illness Sister    Bipolar disorder Other    Depression Other  Schizophrenia Other    Cancer Other        MGF's sisters x2; unknown type    Social History   Socioeconomic History   Marital status: Married    Spouse name: [name]   Number of children: 2   Years of education: 16   Highest education level: Master's degree (e.g., MA, MS, MEng, MEd, MSW, MBA)  Occupational History   Occupation: on disability/retired early former Engineer, site  Tobacco Use   Smoking status: Former    Current packs/day: 0.00    Types: Cigarettes    Start date: 51    Quit date: 1984    Years since quitting: 40.8    Passive exposure: Past   Smokeless tobacco: Never  Vaping Use   Vaping status: Never Used  Substance and Sexual Activity   Alcohol use: Never   Drug use: Never   Sexual activity: Not on file  Other Topics Concern   Not on file  Social History Narrative   Not on file   Social Determinants of Health   Financial Resource Strain: Medium Risk (10/30/2022)   Overall Financial Resource Strain (CARDIA)    Difficulty of Paying Living Expenses: Somewhat hard  Food Insecurity: No Food Insecurity (02/13/2023)   Hunger Vital Sign    Worried About Running Out of Food in the Last Year: Never true    Ran Out of Food in the Last Year: Never  true  Transportation Needs: No Transportation Needs (02/13/2023)   PRAPARE - Administrator, Civil Service (Medical): No    Lack of Transportation (Non-Medical): No  Physical Activity: Insufficiently Active (05/13/2023)   Exercise Vital Sign    Days of Exercise per Week: 3 days    Minutes of Exercise per Session: 30 min  Stress: Stress Concern Present (05/13/2023)   [employer] of Occupational Health - Occupational Stress Questionnaire    Feeling of Stress : Very much  Social Connections: Moderately Integrated (07/25/2022)   Social Connection and Isolation Panel [NHANES]    Frequency of Communication with Friends and Family: More than three times a week    Frequency of Social Gatherings with Friends and Family: More than three times a week    Attends Religious Services: More than 4 times per year    Active Member of [employer] or Organizations: No    Attends Banker Meetings: Never    Marital Status: Married  Catering manager Violence: Not At Risk (02/13/2023)   Humiliation, Afraid, Rape, and Kick questionnaire    Fear of Current or Ex-Partner: No    Emotionally Abused: No    Physically Abused: No    Sexually Abused: No    Outpatient Medications Prior to Visit  Medication Sig Dispense Refill   allopurinol (ZYLOPRIM) 100 MG tablet Take 0.5 tablets (50 mg total) by mouth daily. 90 tablet 1   aspirin EC 81 MG tablet Take 1 tablet (81 mg total) by mouth daily. Swallow whole. 30 tablet 12   busPIRone (BUSPAR) 10 MG tablet Take 1 tablet (10 mg total) by mouth 3 (three) times daily. 270 tablet 1   carvedilol (COREG) 12.5 MG tablet Take 1 tablet (12.5 mg total) by mouth 2 (two) times daily. 180 tablet 1   Continuous Blood Gluc Sensor (FREESTYLE LIBRE 2 SENSOR) MISC USE TO check blood glucose AS DIRECTED AND CHANGE sensor every 14 DAYS     CRANBERRY PO Take 2 tablets by mouth daily.     EPINEPHrine 0.3 mg/0.3 mL  IJ SOAJ injection Use as directed for life-threatening  allergic reaction. 4 each 3   Evolocumab (REPATHA SURECLICK) 140 MG/ML SOAJ Inject 140 mg into the skin every 14 (fourteen) days. 6 mL 1   ezetimibe (ZETIA) 10 MG tablet Take 1 tablet (10 mg total) by mouth every evening. 90 tablet 1   HYDROcodone-acetaminophen (NORCO) 7.5-325 MG tablet Take 1 tablet by mouth 3 (three) times daily as needed. 90 tablet 0   insulin aspart (NOVOLOG FLEXPEN) 100 UNIT/ML FlexPen Inject 2 - 10 Units before meals based on SSI. 15 mL 3   insulin glargine (LANTUS SOLOSTAR) 100 UNIT/ML Solostar Pen Inject 60 Units into the skin daily. 30 mL 5   Insulin Pen Needle (BD PEN NEEDLE NANO 2ND GEN) 32G X 4 MM MISC Inject 1 each into the skin in the morning, at noon, in the evening, and at bedtime. 600 each 3   latanoprost (XALATAN) 0.005 % ophthalmic solution Place 1 drop into both eyes at bedtime.     LEVEMIR FLEXPEN 100 UNIT/ML FlexPen Inject 55 Units into the skin daily. 18 mL 1   linaclotide (LINZESS) 145 MCG CAPS capsule Take 1 capsule (145 mcg total) by mouth daily before breakfast. 90 capsule 1   LORazepam (ATIVAN) 0.5 MG tablet Take 1 tablet (0.5 mg total) by mouth daily as needed for anxiety. 90 tablet 1   magnesium oxide (MAG-OX) 400 MG tablet Take 2 tablets (800 mg total) by mouth daily. Take 2 (400 mg) daily=800 mg 180 tablet 1   nitroGLYCERIN (NITROSTAT) 0.4 MG SL tablet DISSOLVE ONE TABLET UNDER THE TONGUE EVERY 5 MINUTES AS NEEDED FOR CHEST PAIN.  DO NOT EXCEED A TOTAL OF 3 DOSES IN 15 MINUTES 50 tablet 3   Omega-3 Fatty Acids (FISH OIL PO) Take 1 capsule by mouth daily.     OXYGEN Inhale 2 L into the lungs at bedtime.     pantoprazole (PROTONIX) 40 MG tablet Take 1 tablet (40 mg total) by mouth daily. 90 tablet 1   polyethylene glycol powder (GLYCOLAX/MIRALAX) 17 GM/SCOOP powder Take 17 g by mouth daily. 238 g 1   potassium chloride SA (KLOR-CON M) 20 MEQ tablet Take 2 tablets (40 mEq total) by mouth daily. 180 tablet 1   Probiotic CAPS Take 1 capsule by mouth  daily.     promethazine (PHENERGAN) 25 MG tablet Take 1 tablet (25 mg total) by mouth daily as needed. 90 tablet 1   ticagrelor (BRILINTA) 90 MG TABS tablet Take 1 tablet (90 mg total) by mouth 2 (two) times daily. 180 tablet 1   torsemide (DEMADEX) 20 MG tablet Take 3 tablets (60 mg total) by mouth daily. 270 tablet 1   No facility-administered medications prior to visit.    Allergies  Allergen Reactions   Naproxen Hives and Rash   Shellfish-Derived Products Hives, Swelling and Other (See Comments)    Angioedema    Strawberry (Diagnostic) Hives   Celecoxib Other (See Comments)    Stomach pain   Glucosamine Hives, Swelling and Other (See Comments)    Angioedema    Iron     IV iron transfusion - hives - Feb 2022 hospitalization   Metoclopramide Other (See Comments)    Facial twitching and stuttering   Metolazone Other (See Comments)    Acute renal failure   Statins Other (See Comments)    Muscle pain    Review of Systems  Constitutional: Negative.   HENT: Negative.    Respiratory: Negative.  Cardiovascular: Negative.   Gastrointestinal: Negative.   Musculoskeletal:  Positive for arthralgias and joint swelling.  Neurological:  Positive for numbness (neuropathy in her legs).  Psychiatric/Behavioral: Negative.         Objective:        05/14/2023   10:52 AM 05/13/2023    8:00 AM 04/16/2023    1:35 PM  Vitals with BMI  Height 5\' 5"   5\' 5"   Weight 230 lbs 13 oz 233 lbs 235 lbs  BMI 38.41  39.11  Systolic 138 127 045  Diastolic 64 59 62  Pulse 75  70    Orthostatic VS for the past 72 hrs (Last 3 readings):  Patient Position BP Location Cuff Size  05/14/23 1052 Sitting Left Arm Large     Physical Exam Vitals and nursing note reviewed.  Constitutional:      Appearance: She is obese.  HENT:     Head: Normocephalic and atraumatic.  Cardiovascular:     Rate and Rhythm: Normal rate and regular rhythm.  Pulmonary:     Effort: Pulmonary effort is normal.      Breath sounds: Normal breath sounds.  Musculoskeletal:     Comments: No erythema or warmth or swelling noted in hands. Trigger finger/ restricted ROM of right and left middle finger She had pain with grasp.   Neurological:     General: No focal deficit present.     Mental Status: She is alert.     Health Maintenance Due  Topic Date Due   DTaP/Tdap/Td (1 - Tdap) Never done   Zoster Vaccines- Shingrix (1 of 2) Never done   Cervical Cancer Screening (HPV/Pap Cotest)  Never done   OPHTHALMOLOGY EXAM  11/14/2022   MAMMOGRAM  03/15/2023    There are no preventive care reminders to display for this patient.   Lab Results  Component Value Date   TSH 2.900 01/19/2022   Lab Results  Component Value Date   WBC 11.5 (H) 04/04/2023   HGB 9.6 (L) 04/04/2023   HCT 32.3 (L) 04/04/2023   MCV 92 04/04/2023   PLT 243 04/04/2023   Lab Results  Component Value Date   NA 141 04/04/2023   K 4.5 04/04/2023   CO2 22 04/04/2023   GLUCOSE 239 (H) 04/04/2023   BUN 59 (H) 04/04/2023   CREATININE 2.04 (H) 04/04/2023   BILITOT 0.4 04/04/2023   ALKPHOS 177 (H) 04/04/2023   AST 14 04/04/2023   ALT 13 04/04/2023   PROT 7.0 04/04/2023   ALBUMIN 3.6 (L) 04/04/2023   CALCIUM 8.6 (L) 04/04/2023   ANIONGAP 13 02/20/2023   EGFR 27 (L) 04/04/2023   Lab Results  Component Value Date   CHOL 131 04/04/2023   Lab Results  Component Value Date   HDL 42 04/04/2023   Lab Results  Component Value Date   LDLCALC 68 04/04/2023   Lab Results  Component Value Date   TRIG 114 04/04/2023   Lab Results  Component Value Date   CHOLHDL 3.1 04/04/2023   Lab Results  Component Value Date   HGBA1C 8.5 (H) 02/13/2023       Assessment & Plan:  Bilateral hand pain Assessment & Plan: Deltha is here for a problem visit- bilateral hand pain and burning that started 5 days ago. Has known history of gout in her hands.  Has been on Allopurinol 300 mg daily but due to CKD 4, the dose has been  decreased to 50 mg daily recently.  Exam not very  pathognomonic for gout.  PLAN: will order uric acid level. Only if it comes back elevated, could we infer that it could be gout., Cannot exclude diabetic neuropathy vs arthritis. Advised that she continue taking tylenol, vicodin if severe pain We could consider putting her back on low dose gabapentin if it is not gout. Advised to call her neurologist for recommendations.    Orders: -     Comprehensive metabolic panel -     Uric acid -     Hemoglobin A1c  Chronic gout without tophus, unspecified cause, unspecified site Assessment & Plan: History of chronic gout without tophus. On allopurinol, 50 mg daily at this time due to her CKD 4  She does see a nephrologist.  Plan: If her uric acid comes back elevated, will offer her colchicine 2 tablets of 0.6 mg followed by 1 tablet of 0.6 mg an hour later for her gout flare. -Also advised to reach out to her nephrologist for recommendations. -To avoid foods that trigger gout flares. -To take Tylenol as needed for any pain or discomfort.  She does have Vicodin to take.  Orders: -     Comprehensive metabolic panel -     Uric acid -     Hemoglobin A1c  Chronic kidney disease, stage 4 (severe) (HCC) Assessment & Plan: CKD stage IV secondary to chronic diabetes type 1 which is uncontrolled with complications.   Follows up with nephrology.    Orders: -     Comprehensive metabolic panel -     Uric acid -     Hemoglobin A1c  Diabetic polyneuropathy associated with type 1 diabetes mellitus (HCC) Assessment & Plan: Reports neuropathy in her legs but not in her hands. She was reportedly on gabapentin for this but stopped taking it.  States her neuropathy has not been bothering her lately.  Advised that if her uric acid comes back normal and if she continues to have the burning pain in her hands, we could trial putting her back on gabapentin after renally dosing it for her CKD 4.  She  agrees to the plan  Orders: -     Comprehensive metabolic panel -     Uric acid -     Hemoglobin A1c     No orders of the defined types were placed in this encounter.   Orders Placed This Encounter  Procedures   Comprehensive metabolic panel   Uric acid   Hemoglobin A1c     Follow-up: No follow-ups on file.  An After Visit Summary was printed and given to the patient.  Windell Moment, MD [facility] [phone]

## 2023-05-14 NOTE — Assessment & Plan Note (Addendum)
Dana [name] is here for a problem visit- bilateral hand pain and burning that started 5 days ago. Has known history of gout in her hands.  Has been on Allopurinol 300 mg daily but due to CKD 4, the dose has been decreased to 50 mg daily recently.  Exam not very pathognomonic for gout.  PLAN: will order uric acid level. Only if it comes back elevated, could we infer that it could be gout., Cannot exclude diabetic neuropathy vs arthritis. Advised that she continue taking tylenol, vicodin if severe pain We could consider putting her back on low dose gabapentin if it is not gout. Advised to call her neurologist for recommendations.

## 2023-05-15 ENCOUNTER — Other Ambulatory Visit (HOSPITAL_COMMUNITY): Payer: Self-pay

## 2023-05-15 ENCOUNTER — Encounter (HOSPITAL_COMMUNITY): Payer: PPO

## 2023-05-15 LAB — COMPREHENSIVE METABOLIC PANEL
ALT: 19 [IU]/L (ref 0–32)
AST: 22 [IU]/L (ref 0–40)
Albumin: 3.8 g/dL (ref 3.8–4.9)
Alkaline Phosphatase: 182 [IU]/L — ABNORMAL HIGH (ref 44–121)
BUN/Creatinine Ratio: 27 (ref 12–28)
BUN: 60 mg/dL — ABNORMAL HIGH (ref 8–27)
Bilirubin Total: 0.4 mg/dL (ref 0.0–1.2)
CO2: 24 mmol/L (ref 20–29)
Calcium: 8.6 mg/dL — ABNORMAL LOW (ref 8.7–10.3)
Chloride: 102 mmol/L (ref 96–106)
Creatinine, Ser: 2.23 mg/dL — ABNORMAL HIGH (ref 0.57–1.00)
Globulin, Total: 3.4 g/dL (ref 1.5–4.5)
Glucose: 145 mg/dL — ABNORMAL HIGH (ref 70–99)
Potassium: 4.5 mmol/L (ref 3.5–5.2)
Sodium: 141 mmol/L (ref 134–144)
Total Protein: 7.2 g/dL (ref 6.0–8.5)
eGFR: 25 mL/min/{1.73_m2} — ABNORMAL LOW (ref >59)

## 2023-05-15 LAB — HEMOGLOBIN A1C
Est. average glucose Bld gHb Est-mCnc: 212 mg/dL
Hgb A1c MFr Bld: 9 % — ABNORMAL HIGH (ref 4.8–5.6)

## 2023-05-15 LAB — URIC ACID: Uric Acid: 6.5 mg/dL (ref 3.0–7.2)

## 2023-05-16 DIAGNOSIS — G4733 Obstructive sleep apnea (adult) (pediatric): Secondary | ICD-10-CM | POA: Diagnosis not present

## 2023-05-20 ENCOUNTER — Other Ambulatory Visit (HOSPITAL_COMMUNITY): Payer: Self-pay

## 2023-05-20 MED ORDER — GABAPENTIN 100 MG PO CAPS
100.0000 mg | ORAL_CAPSULE | Freq: Two times a day (BID) | ORAL | 0 refills | Status: DC
  Filled 2023-05-20: qty 180, 90d supply, fill #0

## 2023-05-22 ENCOUNTER — Ambulatory Visit (INDEPENDENT_AMBULATORY_CARE_PROVIDER_SITE_OTHER): Payer: Medicaid Other | Admitting: Podiatry

## 2023-05-22 ENCOUNTER — Encounter: Payer: Self-pay | Admitting: Podiatry

## 2023-05-22 ENCOUNTER — Encounter: Payer: Self-pay | Admitting: Oncology

## 2023-05-22 DIAGNOSIS — B351 Tinea unguium: Secondary | ICD-10-CM

## 2023-05-22 DIAGNOSIS — M79675 Pain in left toe(s): Secondary | ICD-10-CM | POA: Diagnosis not present

## 2023-05-22 DIAGNOSIS — Z89419 Acquired absence of unspecified great toe: Secondary | ICD-10-CM

## 2023-05-22 DIAGNOSIS — R601 Generalized edema: Secondary | ICD-10-CM

## 2023-05-22 DIAGNOSIS — E1142 Type 2 diabetes mellitus with diabetic polyneuropathy: Secondary | ICD-10-CM

## 2023-05-22 DIAGNOSIS — M79674 Pain in right toe(s): Secondary | ICD-10-CM

## 2023-05-22 NOTE — Progress Notes (Signed)
Subjective:  Patient ID: Dana [name], female    DOB: 20-Jul-1960,  MRN: [id number]   Dana [name] presents to clinic today for:  Chief Complaint  Patient presents with   [facility]    New patient. Diabetic Foot Care. Last A1c ~9. Nail trim. Hx left 1st and 4th toe amputations. Reports Neuropathy   Patient notes nails are thick, discolored, elongated and painful in shoegear when trying to ambulate.    PCP is Cox, Fritzi Mandes, MD.  Past Medical History:  Diagnosis Date   Acquired absence of left great toe [facility])    Acquired absence of other left toe(s) (HCC)    ACS (acute coronary syndrome) (HCC)    Anemia    Anoxic brain injury (HCC)    Anxiety    CAD (coronary artery disease)    DES mLAD 04/07/15; DES dRCA & DES pPDA 08/30/16; DES mCX & PTCA OM1 09/29/20   Chronic diastolic (congestive) heart failure (HCC)    Chronic pain    Chronic systolic (congestive) heart failure (HCC)    CKD (chronic kidney disease)    Contrast dye induced nephropathy, possible 09/03/2020   COPD (chronic obstructive pulmonary disease) (HCC)    COVID-19 virus infection 03/03/2023   Diabetes (HCC)type 1    Diastolic CHF (HCC)    Dyspnea    Family history of breast cancer 10/23/2021   Gastro-esophageal reflux disease without esophagitis    Hyperlipidemia    Hypertension    Hypertensive heart disease with heart failure (HCC)    Hypoxemia    Idiopathic gout, multiple sites    Leukocytosis 03/29/2022   Localized edema    Low back pain    Mitral regurgitation    Mitral regurgitation    Mixed hyperlipidemia    Mixed incontinence    Morbid (severe) obesity due to excess calories (HCC)    Neuromuscular disorder (HCC)    Neuropathy    Non-ST elevation (NSTEMI) myocardial infarction (HCC)    08/29/20, 09/29/20   OSA (obstructive sleep apnea) 09/03/2020   Other specified hypothyroidism    PAF (paroxysmal atrial fibrillation) (HCC)    s/p pulmonary vein isolation by cryoablation 10/04/15 Dr. Rudolpho Sevin in [facility]   PEA (Pulseless electrical activity) [facility])    ~ 01/13/21 at [facility] during admission DKA, volume overload, possible CAP, hypoxia s/p ACLS with ROSC ~ 10-15   Pneumonia    Primary insomnia    Rheumatoid arthritis (HCC)    Stroke (HCC)    Thyroid disease    Type 1 diabetes mellitus (HCC)    Vitamin D deficiency     Past Surgical History:  Procedure Laterality Date   ABDOMINAL HYSTERECTOMY     Partial- Still has both ovaries   CARDIOVASCULAR STRESS TEST  08/13/2014   Nuclear; Normal   CARPAL TUNNEL RELEASE     CATARACT EXTRACTION, BILATERAL     CESAREAN SECTION     CHOLECYSTECTOMY     CORONARY ANGIOPLASTY WITH STENT PLACEMENT     3 blockages/ 1 stent   CORONARY PRESSURE/FFR STUDY N/A 09/07/2022   Procedure: INTRAVASCULAR PRESSURE WIRE/FFR STUDY;  Surgeon: Swaziland, Peter M, MD;  Location: [facility] INVASIVE CV LAB;  Service: Cardiovascular;  Laterality: N/A;   CORONARY STENT INTERVENTION N/A 09/29/2020   Procedure: CORONARY STENT INTERVENTION;  Surgeon: Tonny Bollman, MD;  Location: [facility] INVASIVE CV LAB;  Service: Cardiovascular;  Laterality: N/A;  CFX   CORONARY STENT INTERVENTION N/A 09/07/2022   Procedure: CORONARY STENT INTERVENTION;  Surgeon: Swaziland, Peter M, MD;  Location: [facility] INVASIVE CV LAB;  Service: Cardiovascular;  Laterality: N/A;   CORONARY STENT INTERVENTION N/A 02/13/2023   Procedure: CORONARY STENT INTERVENTION;  Surgeon: Kathleene Hazel, MD;  Location: [facility] INVASIVE CV LAB;  Service: Cardiovascular;  Laterality: N/A;  LAD   CORONARY ULTRASOUND/IVUS N/A 02/13/2023   Procedure: Coronary Ultrasound/IVUS;  Surgeon: Kathleene Hazel, MD;  Location: [facility] INVASIVE CV LAB;  Service: Cardiovascular;  Laterality: N/A;   CORONARY/GRAFT ACUTE MI REVASCULARIZATION N/A 12/08/2022   Procedure: Coronary/Graft Acute MI Revascularization;  Surgeon: Marykay Lex, MD;  Location: [facility] INVASIVE CV LAB;  Service: Cardiovascular;  Laterality: N/A;   EYE SURGERY     LEFT HEART  CATH AND CORONARY ANGIOGRAPHY N/A 09/29/2020   Procedure: LEFT HEART CATH AND CORONARY ANGIOGRAPHY;  Surgeon: Tonny Bollman, MD;  Location: [facility] INVASIVE CV LAB;  Service: Cardiovascular;  Laterality: N/A;   LEFT HEART CATH AND CORONARY ANGIOGRAPHY N/A 09/07/2022   Procedure: LEFT HEART CATH AND CORONARY ANGIOGRAPHY;  Surgeon: Swaziland, Peter M, MD;  Location: [facility] INVASIVE CV LAB;  Service: Cardiovascular;  Laterality: N/A;   LEFT HEART CATH AND CORONARY ANGIOGRAPHY N/A 12/08/2022   Procedure: LEFT HEART CATH AND CORONARY ANGIOGRAPHY;  Surgeon: Marykay Lex, MD;  Location: [facility] INVASIVE CV LAB;  Service: Cardiovascular;  Laterality: N/A;   LEFT HEART CATH AND CORONARY ANGIOGRAPHY N/A 02/13/2023   Procedure: LEFT HEART CATH AND CORONARY ANGIOGRAPHY;  Surgeon: Kathleene Hazel, MD;  Location: [facility] INVASIVE CV LAB;  Service: Cardiovascular;  Laterality: N/A;   RIGHT HEART CATH N/A 04/27/2021   Procedure: RIGHT HEART CATH;  Surgeon: Dolores Patty, MD;  Location: [facility] INVASIVE CV LAB;  Service: Cardiovascular;  Laterality: N/A;   RIGHT/LEFT HEART CATH AND CORONARY ANGIOGRAPHY N/A 01/07/2017   Procedure: Right/Left Heart Cath and Coronary Angiography;  Surgeon: Laurey Morale, MD;  Location: [facility] INVASIVE CV LAB;  Service: Cardiovascular;  Laterality: N/A;   ROTATOR CUFF REPAIR     TEE WITHOUT CARDIOVERSION N/A 04/28/2021   Procedure: TRANSESOPHAGEAL ECHOCARDIOGRAM (TEE);  Surgeon: Dolores Patty, MD;  Location: [facility] ENDOSCOPY;  Service: Cardiovascular;  Laterality: N/A;   TEE WITHOUT CARDIOVERSION N/A 05/18/2021   Procedure: TRANSESOPHAGEAL ECHOCARDIOGRAM (TEE);  Surgeon: Orbie Pyo, MD;  Location: [facility] INVASIVE CV LAB;  Service: Cardiovascular;  Laterality: N/A;   TOE AMPUTATION Left    TRANSCATHETER MITRAL EDGE TO EDGE REPAIR N/A 05/18/2021   Procedure: MITRAL VALVE REPAIR;  Surgeon: Orbie Pyo, MD;  Location: [facility] INVASIVE CV LAB;  Service: Cardiovascular;  Laterality: N/A;   US  ECHOCARDIOGRAPHY  05/2017   Normal    Allergies  Allergen Reactions   Naproxen Hives and Rash   Shellfish-Derived Products Hives, Swelling and Other (See Comments)    Angioedema    Strawberry (Diagnostic) Hives   Celecoxib Other (See Comments)    Stomach pain   Glucosamine Hives, Swelling and Other (See Comments)    Angioedema    Iron     IV iron transfusion - hives - Feb 2022 hospitalization   Metoclopramide Other (See Comments)    Facial twitching and stuttering   Metolazone Other (See Comments)    Acute renal failure   Statins Other (See Comments)    Muscle pain    Review of Systems: Negative except as noted in the HPI.  Objective:  There were no vitals filed for this visit.  Dana [name] is a pleasant 62 y.o. female in NAD. AAO x 3.  Vascular Examination: Capillary refill time is 3-5 seconds to toes bilateral. Palpable pedal pulses b/l LE. Digital hair sparse b/l.  Skin temperature gradient WNL b/l. +1 pitting edema bilateral legs and ankles.  Dermatological Examination: Pedal skin with normal turgor, texture and tone b/l. No open wounds. No interdigital macerations b/l. Toenails x8 are 3mm thick, discolored, dystrophic with subungual debris. There is pain with compression of the nail plates.  They are elongated x8  Neurological Examination: Protective sensation diminished bilateral LE.   Musculoskeletal Examination: Muscle strength 5/5 to all LE muscle groups b/l. Lesser hammertoes noted.  Left hallux and 4th toe amputation status     Latest Ref Rng & Units 05/14/2023   11:23 AM 02/13/2023    2:02 AM 08/31/2022    8:14 AM  Hemoglobin A1C  Hemoglobin-A1c 4.8 - 5.6 % 9.0  8.5  7.4    Assessment/Plan: 1. Pain due to onychomycosis of toenails of both feet   2. Type 2 diabetes mellitus with peripheral neuropathy (HCC)   3. Generalized edema   4. History of amputation of hallux (HCC)    The mycotic toenails were sharply debrided x10 with sterile nail nippers  and a power debriding burr to decrease bulk/thickness and length.    She'll continue with her compression stockings for edema management.  Return in about 3 months (around 08/22/2023) for [facility].   Clerance Lav, DPM, FACFAS [facility]     [address] [place], [place] [address]                Office [phone]  Fax [phone]

## 2023-05-24 DIAGNOSIS — Z89419 Acquired absence of unspecified great toe: Secondary | ICD-10-CM | POA: Insufficient documentation

## 2023-05-28 ENCOUNTER — Telehealth (HOSPITAL_COMMUNITY): Payer: Self-pay | Admitting: Adult Health

## 2023-05-28 NOTE — Telephone Encounter (Signed)
    Called to remind to complete cardiomems.    Ms Norato says she will start sending readings.    Hanzel Pizzo NP-C  3:42 PM

## 2023-06-04 DIAGNOSIS — N1832 Chronic kidney disease, stage 3b: Secondary | ICD-10-CM | POA: Diagnosis not present

## 2023-06-04 DIAGNOSIS — I129 Hypertensive chronic kidney disease with stage 1 through stage 4 chronic kidney disease, or unspecified chronic kidney disease: Secondary | ICD-10-CM | POA: Diagnosis not present

## 2023-06-04 DIAGNOSIS — D631 Anemia in chronic kidney disease: Secondary | ICD-10-CM | POA: Diagnosis not present

## 2023-06-04 DIAGNOSIS — I2510 Atherosclerotic heart disease of native coronary artery without angina pectoris: Secondary | ICD-10-CM | POA: Diagnosis not present

## 2023-06-10 DIAGNOSIS — F411 Generalized anxiety disorder: Secondary | ICD-10-CM | POA: Diagnosis not present

## 2023-06-17 ENCOUNTER — Other Ambulatory Visit: Payer: Self-pay | Admitting: Family Medicine

## 2023-06-17 MED ORDER — HYDROCODONE-ACETAMINOPHEN 7.5-325 MG PO TABS
1.0000 | ORAL_TABLET | Freq: Three times a day (TID) | ORAL | 0 refills | Status: DC | PRN
  Filled 2023-06-17: qty 90, 30d supply, fill #0

## 2023-06-18 ENCOUNTER — Other Ambulatory Visit: Payer: Self-pay

## 2023-06-18 ENCOUNTER — Other Ambulatory Visit (HOSPITAL_COMMUNITY): Payer: Self-pay

## 2023-06-18 DIAGNOSIS — F411 Generalized anxiety disorder: Secondary | ICD-10-CM | POA: Diagnosis not present

## 2023-06-20 ENCOUNTER — Other Ambulatory Visit (HOSPITAL_COMMUNITY): Payer: Self-pay

## 2023-06-24 ENCOUNTER — Encounter: Payer: Self-pay | Admitting: Oncology

## 2023-06-25 ENCOUNTER — Other Ambulatory Visit: Payer: Self-pay | Admitting: Pharmacist

## 2023-06-27 DIAGNOSIS — F411 Generalized anxiety disorder: Secondary | ICD-10-CM | POA: Diagnosis not present

## 2023-07-01 ENCOUNTER — Encounter: Payer: Self-pay | Admitting: *Deleted

## 2023-07-01 ENCOUNTER — Other Ambulatory Visit: Payer: Self-pay | Admitting: *Deleted

## 2023-07-01 NOTE — Patient Instructions (Signed)
Visit Information  Thank you for taking time to visit with me today. Please don't hesitate to contact me if I can be of assistance to you before our next scheduled telephone appointment.  Following are the goals we discussed today:   Goals Addressed             This Visit's Progress    RNCM Care Management Expected Outcome:  Monitor, Self-Manage and Reduce Symptoms of Diabetes       Current Barriers:  Chronic Disease Management support and education needs related to effective management of DM Lab Results  Component Value Date   HGBA1C 9.0 (H) 05/14/2023    Planned Interventions: Provided education to patient about basic DM disease process. Patient states she has been experiencing some hypoglycemic episodes due to a change in her long acting insulin. She states that Levemir was no longer available and they switched her to Lantus and she has had to titrate down the number of units she is using. She reports a fasting blood sugar of 89 this morning and states the highest she has seen is 209 and contributes this to having comfort foods. The lowest she has seen was 53 and she states she corrected it by drinking juice and having a chicken salad sandwich.  Reviewed medications with patient and discussed importance of medication adherence. Patient states compliance with medications.  Reviewed prescribed diet with patient heart healthy/ADA diet. Is compliant of heart healthy/ADA diet.  Counseled on importance of regular laboratory monitoring as prescribed. Has regular lab work done. Review of A1C levels. The patient has an A1C level higher than normal. The patient knows and is continuing to work with the endocrinologist and her providers to get her A1C in range.  Discussed plans with patient for ongoing care management follow up and provided patient with direct contact information for care management team;      Provided patient with written educational materials related to hypo and hyperglycemia and  importance of correct treatment.      Reviewed scheduled/upcoming provider appointments including: 07-12-2023 with PCP Advised patient, providing education and rationale, to check cbg twice daily and when you have symptoms of low or high blood sugar and record. Has a continuous reader.       call provider for findings outside established parameters;       Referral made to community resources care guide team for assistance with food insecurities and resources in her area to help with expressed needs. Has talked with care guides and had resources mailed.  ;      Review of patient status, including review of consultants reports, relevant laboratory and other test results, and medications completed;       Advised patient to discuss changes in DM, questions, and concerns with provider;      Screening for signs and symptoms of depression related to chronic disease state;        Assessed social determinant of health barriers;         Symptom Management: Take medications as prescribed   Attend all scheduled provider appointments Call provider office for new concerns or questions  call the Suicide and Crisis Lifeline: 988 call the [place] National Suicide Prevention Lifeline: 904-557-0406 or TTY: 812-570-9745 TTY 262-711-7061) to talk to a trained counselor call 1-800-273-TALK (toll free, 24 hour hotline) if experiencing a Mental Health or Behavioral Health Crisis  keep appointment with eye doctor check feet daily for cuts, sores or redness trim toenails straight across manage portion size wash  and dry feet carefully every day wear comfortable, cotton socks wear comfortable, well-fitting shoes  Follow Up Plan: Telephone follow up appointment with care management team member scheduled for: 08-01-2023 at 345 pm       RNCM Care Management Expected Outcome:  Monitor, Self-Manage and Reduce Symptoms of: Depression, Anxiety, Stress       Current Barriers:  Knowledge Deficits related to resources  available to help with expressed needs at this time due to her husband losing his job and his unemployment has expired Care Coordination needs related to resources to help meet financial needs at this time in a patient with stress, anxiety, and depression  Chronic Disease Management support and education needs related to effective management of stress, anxiety, and depression Financial Constraints.   Planned Interventions: Evaluation of current treatment plan related to depression, stress, and anxiety and patient's adherence to plan as established by provider. The patient states she is doing well and speaks with her counselor every 2 weeks or more if needed. Reviewed medications with patient and discussed compliance. The patient is compliant with medications.  Provided patient with care guide and SW support as well as support from the interdisciplinary team for management of stress, anxiety, and depression  educational materials related to effective management of stress, anxiety, and depression. Declines LCSW referral at this time. Reviewed scheduled/upcoming provider appointments including 07-12-2023 with PCP Care Guide referral for community resources for [facility] for food insecurities and assistance with utilities. Has information from the care guides  Social Work referral for education and support and resources for effective management of stress, anxiety, and depression.  Discussed plans with patient for ongoing care management follow up and provided patient with direct contact information for care management team Advised patient to discuss changes in mood, increased anxiety, depression, stress, or other mental health needs with provider Screening for signs and symptoms of depression related to chronic disease state  Assessed social determinant of health barriers  Symptom Management: Take medications as prescribed   Attend all scheduled provider appointments Call provider office for  new concerns or questions  call the Suicide and Crisis Lifeline: 988 call the [place] National Suicide Prevention Lifeline: 210-459-3434 or TTY: 2541840266 TTY 936-229-8156) to talk to a trained counselor call 1-800-273-TALK (toll free, 24 hour hotline) if experiencing a Mental Health or Behavioral Health Crisis   Follow Up Plan: Telephone follow up appointment with care management team member scheduled for: 08-01-2023 at 345 pm       RNCM Care Management Expected Outcome:  Monitor, Self-Manage, and Reduce Symptoms of Hypertension       Current Barriers:  Chronic Disease Management support and education needs related to effective management of HTN BP Readings from Last 3 Encounters:  05/14/23 138/64  05/13/23 (!) 127/59  04/16/23 126/62   Wt Readings from Last 3 Encounters:  05/14/23 230 lb 12.8 oz (104.7 kg)  05/13/23 233 lb (105.7 kg)  04/16/23 235 lb (106.6 kg)    Planned Interventions: Evaluation of current treatment plan related to hypertension self management and patient's adherence to plan as established by provider. Regularly checks blood pressure and states. Self reported BP today 126/62 and she states this is her typical range.  Provided education to patient re: stroke prevention, s/s of heart attack and stroke.  Reviewed prescribed diet heart healthy/ADA diet.  Reviewed medications with patient and discussed importance of compliance. Reports compliance with all medications. Discussed plans with patient for ongoing care management follow up and provided patient with  direct contact information for care management team; Advised patient, providing education and rationale, to monitor blood pressure daily and record, calling PCP for findings outside established parameters.  Reviewed scheduled/upcoming provider appointments including: 07-12-2023 with PCP Advised patient to discuss changes in her blood pressures or heart health  with provider; Provided education on prescribed diet  heart healthy/ADA diet.  Discussed complications of poorly controlled blood pressure such as heart disease, stroke, circulatory complications, vision complications, kidney impairment, sexual dysfunction;  Screening for signs and symptoms of depression related to chronic disease state;  Assessed social determinant of health barriers;   Symptom Management: Take medications as prescribed   Attend all scheduled provider appointments Call provider office for new concerns or questions  call the Suicide and Crisis Lifeline: 988 call the [place] National Suicide Prevention Lifeline: 480 783 5969 or TTY: 810-077-2872 TTY 724-452-1028) to talk to a trained counselor call 1-800-273-TALK (toll free, 24 hour hotline) if experiencing a Mental Health or Behavioral Health Crisis  check blood pressure 3 times per week write blood pressure results in a log or diary learn about high blood pressure keep a blood pressure log take blood pressure log to all doctor appointments call doctor for signs and symptoms of high blood pressure develop an action plan for high blood pressure keep all doctor appointments take medications for blood pressure exactly as prescribed report new symptoms to your doctor  Follow Up Plan: Telephone follow up appointment with care management team member scheduled for: 08-01-2023 at 345 pm       RNCM Care Management Expected Outcomes: Monitor, Self-Manage and Reduce Symptoms of: CHF       Current Barriers:  Chronic Disease Management support and education needs related to CHF   RNCM Clinical Goal(s):  Patient will verbalize basic understanding of  CHF disease process and self health management plan as evidenced by verbal explanation, recognizing symptoms, lifestyle modifications take all medications exactly as prescribed and will call provider for medication related questions as evidenced by compliance with all medications attend all scheduled medical appointments: with primary  care provider and specialist as evidenced by keeping all scheduled appointments demonstrate Improved and Ongoing adherence to prescribed treatment plan for CHF as evidenced by consistent medication compliance, symptom monitoring, lifestyle modifications, daily weights continue to work with RN Care Manager to address care management and care coordination needs related to  CHF as evidenced by adherence to CM Team Scheduled appointments through collaboration with RN Care manager, provider, and care team.   Interventions: Evaluation of current treatment plan related to  self management and patient's adherence to plan as established by provider   Heart Failure Interventions:  (Status:  Goal on track:  Yes.) Long Term Goal Basic overview and discussion of pathophysiology of Heart Failure reviewed Provided education on low sodium diet Reviewed Heart Failure Action Plan in depth and provided written copy Assessed need for readable accurate scales in home Provided education about placing scale on hard, flat surface Advised patient to weigh each morning after emptying bladder Discussed importance of daily weight and advised patient to weigh and record daily. Does not weigh daily and states she is monitored remotely and they notify her if she is in danger of going into active heart failure. RNCM advise it is still best to perform daily weights and keep log. Reviewed role of diuretics in prevention of fluid overload and management of heart failure; Discussed the importance of keeping all appointments with provider Provided patient with education about the role of exercise in  the management of heart failure Advised patient to discuss any new changes with provider Screening for signs and symptoms of depression related to chronic disease state  Assessed social determinant of health barriers   Patient Goals/Self-Care Activities: Take all medications as prescribed Attend all scheduled provider  appointments Call pharmacy for medication refills 3-7 days in advance of running out of medications Perform all self care activities independently  Call provider office for new concerns or questions  call office if I gain more than 2 pounds in one day or 5 pounds in one week keep legs up while sitting watch for swelling in feet, ankles and legs every day eat more whole grains, fruits and vegetables, lean meats and healthy fats  Follow Up Plan:  Telephone follow up appointment with care management team member scheduled for:  08-01-2023 at 3:45 pm           Our next appointment is by telephone on 08-01-2023 at 3:45 pm  Please call the care guide team at [phone] if you need to cancel or reschedule your appointment.   If you are experiencing a Mental Health or Behavioral Health Crisis or need someone to talk to, please call the Suicide and Crisis Lifeline: 988 call the [place] National Suicide Prevention Lifeline: 231-326-6283 or TTY: 343 540 0904 TTY 971 553 3423) to talk to a trained counselor call 1-800-273-TALK (toll free, 24 hour hotline) call 911   Patient verbalizes understanding of instructions and care plan provided today and agrees to view in MyChart. Active MyChart status and patient understanding of how to access instructions and care plan via MyChart confirmed with patient.     Telephone follow up appointment with care management team member scheduled for:08-01-2023 at 3:45 pm  Danise Edge, BSN RN RN Care Manager  [facility]  Ambulatory Care Management  Direct Number: [phone]

## 2023-07-01 NOTE — Patient Outreach (Signed)
Care Management   Visit Note  07/01/2023 Name: Dana [name] MRN: [id number] DOB: December 22, 1960  Subjective: Dana [name] is a 62 y.o. year old female who is a primary care patient of Cox, Kirsten, MD. The Care Management team was consulted for assistance.      Engaged with patient spoke with patient by telephone.    Goals Addressed             This Visit's Progress    RNCM Care Management Expected Outcome:  Monitor, Self-Manage and Reduce Symptoms of Diabetes       Current Barriers:  Chronic Disease Management support and education needs related to effective management of DM Lab Results  Component Value Date   HGBA1C 9.0 (H) 05/14/2023    Planned Interventions: Provided education to patient about basic DM disease process. Patient states she has been experiencing some hypoglycemic episodes due to a change in her long acting insulin. She states that Levemir was no longer available and they switched her to Lantus and she has had to titrate down the number of units she is using. She reports a fasting blood sugar of 89 this morning and states the highest she has seen is 209 and contributes this to having comfort foods. The lowest she has seen was 53 and she states she corrected it by drinking juice and having a chicken salad sandwich.  Reviewed medications with patient and discussed importance of medication adherence. Patient states compliance with medications.  Reviewed prescribed diet with patient heart healthy/ADA diet. Is compliant of heart healthy/ADA diet.  Counseled on importance of regular laboratory monitoring as prescribed. Has regular lab work done. Review of A1C levels. The patient has an A1C level higher than normal. The patient knows and is continuing to work with the endocrinologist and her providers to get her A1C in range.  Discussed plans with patient for ongoing care management follow up and provided patient with direct contact information for care management team;       Provided patient with written educational materials related to hypo and hyperglycemia and importance of correct treatment.      Reviewed scheduled/upcoming provider appointments including: 07-12-2023 with PCP Advised patient, providing education and rationale, to check cbg twice daily and when you have symptoms of low or high blood sugar and record. Has a continuous reader.       call provider for findings outside established parameters;       Referral made to community resources care guide team for assistance with food insecurities and resources in her area to help with expressed needs. Has talked with care guides and had resources mailed.  ;      Review of patient status, including review of consultants reports, relevant laboratory and other test results, and medications completed;       Advised patient to discuss changes in DM, questions, and concerns with provider;      Screening for signs and symptoms of depression related to chronic disease state;        Assessed social determinant of health barriers;         Symptom Management: Take medications as prescribed   Attend all scheduled provider appointments Call provider office for new concerns or questions  call the Suicide and Crisis Lifeline: 988 call the [place] National Suicide Prevention Lifeline: 505-657-5564 or TTY: 458-341-0392 TTY 775-310-4048) to talk to a trained counselor call 1-800-273-TALK (toll free, 24 hour hotline) if experiencing a Mental Health or Behavioral Health Crisis  keep appointment  with eye doctor check feet daily for cuts, sores or redness trim toenails straight across manage portion size wash and dry feet carefully every day wear comfortable, cotton socks wear comfortable, well-fitting shoes  Follow Up Plan: Telephone follow up appointment with care management team member scheduled for: 08-01-2023 at 345 pm       RNCM Care Management Expected Outcome:  Monitor, Self-Manage and Reduce Symptoms of:  Depression, Anxiety, Stress       Current Barriers:  Knowledge Deficits related to resources available to help with expressed needs at this time due to her husband losing his job and his unemployment has expired Care Coordination needs related to resources to help meet financial needs at this time in a patient with stress, anxiety, and depression  Chronic Disease Management support and education needs related to effective management of stress, anxiety, and depression Financial Constraints.   Planned Interventions: Evaluation of current treatment plan related to depression, stress, and anxiety and patient's adherence to plan as established by provider. The patient states she is doing well and speaks with her counselor every 2 weeks or more if needed. Reviewed medications with patient and discussed compliance. The patient is compliant with medications.  Provided patient with care guide and SW support as well as support from the interdisciplinary team for management of stress, anxiety, and depression  educational materials related to effective management of stress, anxiety, and depression. Declines LCSW referral at this time. Reviewed scheduled/upcoming provider appointments including 07-12-2023 with PCP Care Guide referral for community resources for [facility] for food insecurities and assistance with utilities. Has information from the care guides  Social Work referral for education and support and resources for effective management of stress, anxiety, and depression.  Discussed plans with patient for ongoing care management follow up and provided patient with direct contact information for care management team Advised patient to discuss changes in mood, increased anxiety, depression, stress, or other mental health needs with provider Screening for signs and symptoms of depression related to chronic disease state  Assessed social determinant of health barriers  Symptom Management: Take  medications as prescribed   Attend all scheduled provider appointments Call provider office for new concerns or questions  call the Suicide and Crisis Lifeline: 988 call the [place] National Suicide Prevention Lifeline: 971-600-1623 or TTY: 815-219-1563 TTY (440)723-5272) to talk to a trained counselor call 1-800-273-TALK (toll free, 24 hour hotline) if experiencing a Mental Health or Behavioral Health Crisis   Follow Up Plan: Telephone follow up appointment with care management team member scheduled for: 08-01-2023 at 345 pm       RNCM Care Management Expected Outcome:  Monitor, Self-Manage, and Reduce Symptoms of Hypertension       Current Barriers:  Chronic Disease Management support and education needs related to effective management of HTN BP Readings from Last 3 Encounters:  05/14/23 138/64  05/13/23 (!) 127/59  04/16/23 126/62   Wt Readings from Last 3 Encounters:  05/14/23 230 lb 12.8 oz (104.7 kg)  05/13/23 233 lb (105.7 kg)  04/16/23 235 lb (106.6 kg)    Planned Interventions: Evaluation of current treatment plan related to hypertension self management and patient's adherence to plan as established by provider. Regularly checks blood pressure and states. Self reported BP today 126/62 and she states this is her typical range.  Provided education to patient re: stroke prevention, s/s of heart attack and stroke.  Reviewed prescribed diet heart healthy/ADA diet.  Reviewed medications with patient and discussed importance of compliance.  Reports compliance with all medications. Discussed plans with patient for ongoing care management follow up and provided patient with direct contact information for care management team; Advised patient, providing education and rationale, to monitor blood pressure daily and record, calling PCP for findings outside established parameters.  Reviewed scheduled/upcoming provider appointments including: 07-12-2023 with PCP Advised patient to discuss  changes in her blood pressures or heart health  with provider; Provided education on prescribed diet heart healthy/ADA diet.  Discussed complications of poorly controlled blood pressure such as heart disease, stroke, circulatory complications, vision complications, kidney impairment, sexual dysfunction;  Screening for signs and symptoms of depression related to chronic disease state;  Assessed social determinant of health barriers;   Symptom Management: Take medications as prescribed   Attend all scheduled provider appointments Call provider office for new concerns or questions  call the Suicide and Crisis Lifeline: 988 call the [place] National Suicide Prevention Lifeline: (430)567-1788 or TTY: 934-716-2975 TTY (929)026-4053) to talk to a trained counselor call 1-800-273-TALK (toll free, 24 hour hotline) if experiencing a Mental Health or Behavioral Health Crisis  check blood pressure 3 times per week write blood pressure results in a log or diary learn about high blood pressure keep a blood pressure log take blood pressure log to all doctor appointments call doctor for signs and symptoms of high blood pressure develop an action plan for high blood pressure keep all doctor appointments take medications for blood pressure exactly as prescribed report new symptoms to your doctor  Follow Up Plan: Telephone follow up appointment with care management team member scheduled for: 08-01-2023 at 345 pm       RNCM Care Management Expected Outcomes: Monitor, Self-Manage and Reduce Symptoms of: CHF       Current Barriers:  Chronic Disease Management support and education needs related to CHF   RNCM Clinical Goal(s):  Patient will verbalize basic understanding of  CHF disease process and self health management plan as evidenced by verbal explanation, recognizing symptoms, lifestyle modifications take all medications exactly as prescribed and will call provider for medication related questions as  evidenced by compliance with all medications attend all scheduled medical appointments: with primary care provider and specialist as evidenced by keeping all scheduled appointments demonstrate Improved and Ongoing adherence to prescribed treatment plan for CHF as evidenced by consistent medication compliance, symptom monitoring, lifestyle modifications, daily weights continue to work with RN Care Manager to address care management and care coordination needs related to  CHF as evidenced by adherence to CM Team Scheduled appointments through collaboration with RN Care manager, provider, and care team.   Interventions: Evaluation of current treatment plan related to  self management and patient's adherence to plan as established by provider   Heart Failure Interventions:  (Status:  Goal on track:  Yes.) Long Term Goal Basic overview and discussion of pathophysiology of Heart Failure reviewed Provided education on low sodium diet Reviewed Heart Failure Action Plan in depth and provided written copy Assessed need for readable accurate scales in home Provided education about placing scale on hard, flat surface Advised patient to weigh each morning after emptying bladder Discussed importance of daily weight and advised patient to weigh and record daily. Does not weigh daily and states she is monitored remotely and they notify her if she is in danger of going into active heart failure. RNCM advise it is still best to perform daily weights and keep log. Reviewed role of diuretics in prevention of fluid overload and management of heart failure;  Discussed the importance of keeping all appointments with provider Provided patient with education about the role of exercise in the management of heart failure Advised patient to discuss any new changes with provider Screening for signs and symptoms of depression related to chronic disease state  Assessed social determinant of health barriers   Patient  Goals/Self-Care Activities: Take all medications as prescribed Attend all scheduled provider appointments Call pharmacy for medication refills 3-7 days in advance of running out of medications Perform all self care activities independently  Call provider office for new concerns or questions  call office if I gain more than 2 pounds in one day or 5 pounds in one week keep legs up while sitting watch for swelling in feet, ankles and legs every day eat more whole grains, fruits and vegetables, lean meats and healthy fats  Follow Up Plan:  Telephone follow up appointment with care management team member scheduled for:  08-01-2023 at 3:45 pm           Consent to Services:  Patient was given information about care management services, agreed to services, and gave verbal consent to participate.   Plan: Telephone follow up appointment with care management team member scheduled for:08-01-2023 at 3:45 pm  Danise Edge, BSN RN RN Care Manager  [facility]  Ambulatory Care Management  Direct Number: [phone]

## 2023-07-04 ENCOUNTER — Encounter: Payer: Self-pay | Admitting: Family Medicine

## 2023-07-04 NOTE — Telephone Encounter (Signed)
 Care team updated and letter sent for eye exam notes.

## 2023-07-09 DIAGNOSIS — F411 Generalized anxiety disorder: Secondary | ICD-10-CM | POA: Diagnosis not present

## 2023-07-11 NOTE — Progress Notes (Unsigned)
Subjective:  Patient ID: Dana [name], female    DOB: 1961-04-05  Age: 62 y.o. MRN: [id number]  Chief Complaint  Patient presents with   Medical Management of Chronic Issues   Discussed the use of AI scribe software for clinical note transcription with the patient, who gave verbal consent to proceed.  History of Present Illness          HPI   Diabetes: Managed by Dr. Katrinka Blazing Complications: neuropathy.  Glucose checking:free style libre  Glucose logs:blood sugar this morning was 112.  Most recent A1C: 9.0. Current medications: LANTUS 40 units daily, Novolog 2-10 units on sliding scale before meals.  Last Eye Exam: 3/24 Foot checks: daily   Hyperlipidemia: Current medications: Repatha 140 mg every other week, Zetia 10 mg daily, Fish oil 1000 mg daily.    Depression/GAD: Buspar 10 mg three times a day and Lorazepam 0.5 mg once daily,    Hypertension: Complications: chf, cad, ckd Current medications: Carvedilol 12.5 mg two times daily, Imdur 60 mg daily, Torsemide 80 mg daily, Potassium 20 meq once daily, Brilanta 90 mg TWICE DAILY. See's Dr[facility] Pat[facility]. Weighs daily. Weight stable.    Hypomagnesium: Mag oxide 400 mg 2 daily.    GOUT: Allopurinol 300 mg once daily.   GERD: Protonix 40 mg once daily.    B12 deficiency:  B12 gummies 1200 mg daily.   Chronic back pain: on hydrocodone/apap 7.5/325 mg three times a day as needed. Takes it twice a day and sometimes will take a third.   Diet: healthy Exercise: chair   Chronic respiratory failure with hypoxia and OSA: On cpap and 2 L oxygen at night.     07/12/2023   10:39 AM 05/13/2023    3:54 PM 04/04/2023   10:52 AM 03/26/2023    3:03 PM 02/05/2023    3:34 PM  Depression screen PHQ 2/9  Decreased Interest 3 1 0 1 1  Down, Depressed, Hopeless 2 0 2 1 1   PHQ - 2 Score 5 1 2 2 2   Altered sleeping 3  3 1 1   Tired, decreased energy 3  3 1 1   Change in appetite 3  3 1 1   Feeling bad or  failure about yourself  0  2 0 0  Trouble concentrating 0  2 0 0  Moving slowly or fidgety/restless 0  1 1 1   Suicidal thoughts 0  0 0 0  PHQ-9 Score 14  16 6 6   Difficult doing work/chores Somewhat difficult  Somewhat difficult Somewhat difficult Somewhat difficult        07/12/2023   10:40 AM  Fall Risk   Falls in the past year? 0  Number falls in past yr: 0  Injury with Fall? 0  Risk for fall due to : Impaired balance/gait;Impaired mobility  Follow up Falls evaluation completed    Patient Care Team: Blane Ohara, MD as PCP - General (Family Medicine) Bensimhon, Bevelyn Buckles, MD as PCP - Cardiology (Cardiology) Marcelyn Bruins, MD as Consulting Physician (Allergy) Bensimhon, Bevelyn Buckles, MD as Consulting Physician (Cardiology) Beverly Gust, MD as Referring Physician (Gastroenterology) Doristine Bosworth., MD as Consulting Physician (Endocrinology) Margarette Canada., DPM as Consulting Physician (Podiatry) Donnetta Hail, MD as Consulting Physician (Rheumatology) Barnabas Lister, MD as Referring Physician (Nephrology) Orbie Pyo, MD as Consulting Physician (Cardiology) Dellia Beckwith, MD as Consulting Physician (Oncology) Marisue Brooklyn, MD as Referring Physician Doristine Bosworth., MD (Endocrinology)  Isaiah Blakes, LCSW as [facility] Care Management (Licensed Clinical Social Worker) Ricky Stabs, RN as [facility] Care Management (General Practice) Chrys Racer (Ophthalmology)   Review of Systems  Constitutional:  Negative for appetite change, fatigue and fever.  HENT:  Negative for congestion, ear pain, sinus pressure and sore throat.   Respiratory:  Negative for cough, chest tightness, shortness of breath and wheezing.   Cardiovascular:  Negative for chest pain and palpitations.  Gastrointestinal:  Negative for abdominal pain, constipation, diarrhea, nausea and vomiting.  Genitourinary:  Negative for dysuria and hematuria.   Musculoskeletal:  Negative for arthralgias, back pain, joint swelling and myalgias.  Skin:  Negative for rash.  Neurological:  Positive for dizziness. Negative for weakness and headaches.  Psychiatric/Behavioral:  Negative for dysphoric mood. The patient is not nervous/anxious.     Current Outpatient Medications on File Prior to Visit  Medication Sig Dispense Refill   allopurinol (ZYLOPRIM) 100 MG tablet Take 0.5 tablets (50 mg total) by mouth daily. 90 tablet 1   aspirin EC 81 MG tablet Take 1 tablet (81 mg total) by mouth daily. Swallow whole. 30 tablet 12   carvedilol (COREG) 12.5 MG tablet Take 1 tablet (12.5 mg total) by mouth 2 (two) times daily. 180 tablet 1   Continuous Blood Gluc Sensor (FREESTYLE LIBRE 2 SENSOR) MISC USE TO check blood glucose AS DIRECTED AND CHANGE sensor every 14 DAYS     CRANBERRY PO Take 2 tablets by mouth daily.     EPINEPHrine 0.3 mg/0.3 mL IJ SOAJ injection Use as directed for life-threatening allergic reaction. 4 each 3   Evolocumab (REPATHA SURECLICK) 140 MG/ML SOAJ Inject 140 mg into the skin every 14 (fourteen) days. 6 mL 1   ezetimibe (ZETIA) 10 MG tablet Take 1 tablet (10 mg total) by mouth every evening. 90 tablet 1   gabapentin (NEURONTIN) 100 MG capsule Take 1 capsule (100 mg total) by mouth 2 (two) times daily. 180 capsule 0   HYDROcodone-acetaminophen (NORCO) 7.5-325 MG tablet Take 1 tablet by mouth 3 (three) times daily as needed. 90 tablet 0   insulin aspart (NOVOLOG FLEXPEN) 100 UNIT/ML FlexPen Inject 2 - 10 Units before meals based on SSI. 15 mL 3   insulin glargine (LANTUS SOLOSTAR) 100 UNIT/ML Solostar Pen Inject 60 Units into the skin daily. (Patient taking differently: Inject 40 Units into the skin daily.) 30 mL 5   Insulin Pen Needle (BD PEN NEEDLE NANO 2ND GEN) 32G X 4 MM MISC Inject 1 each into the skin in the morning, at noon, in the evening, and at bedtime. 600 each 3   latanoprost (XALATAN) 0.005 % ophthalmic solution Place 1 drop into  both eyes at bedtime.     linaclotide (LINZESS) 145 MCG CAPS capsule Take 1 capsule (145 mcg total) by mouth daily before breakfast. 90 capsule 1   LORazepam (ATIVAN) 0.5 MG tablet Take 1 tablet (0.5 mg total) by mouth daily as needed for anxiety. 90 tablet 1   magnesium oxide (MAG-OX) 400 MG tablet Take 2 tablets (800 mg total) by mouth daily. Take 2 (400 mg) daily=800 mg 180 tablet 1   nitroGLYCERIN (NITROSTAT) 0.4 MG SL tablet DISSOLVE ONE TABLET UNDER THE TONGUE EVERY 5 MINUTES AS NEEDED FOR CHEST PAIN.  DO NOT EXCEED A TOTAL OF 3 DOSES IN 15 MINUTES 50 tablet 3   Omega-3 Fatty Acids (FISH OIL PO) Take 1 capsule by mouth daily.     OXYGEN Inhale 2 L into  the lungs at bedtime.     pantoprazole (PROTONIX) 40 MG tablet Take 1 tablet (40 mg total) by mouth daily. 90 tablet 1   potassium chloride SA (KLOR-CON M) 20 MEQ tablet Take 2 tablets (40 mEq total) by mouth daily. 180 tablet 1   Probiotic CAPS Take 1 capsule by mouth daily.     promethazine (PHENERGAN) 25 MG tablet Take 1 tablet (25 mg total) by mouth daily as needed. 90 tablet 1   ticagrelor (BRILINTA) 90 MG TABS tablet Take 1 tablet (90 mg total) by mouth 2 (two) times daily. 180 tablet 1   torsemide (DEMADEX) 20 MG tablet Take 3 tablets (60 mg total) by mouth daily. 270 tablet 1   No current facility-administered medications on file prior to visit.   Past Medical History:  Diagnosis Date   Acquired absence of left great toe (HCC)    Acquired absence of other left toe(s) (HCC)    ACS (acute coronary syndrome) (HCC)    Anemia    Anoxic brain injury (HCC)    Anxiety    CAD (coronary artery disease)    DES mLAD 04/07/15; DES dRCA & DES pPDA 08/30/16; DES mCX & PTCA OM1 09/29/20   Chronic diastolic (congestive) heart failure (HCC)    Chronic pain    Chronic systolic (congestive) heart failure (HCC)    CKD (chronic kidney disease)    Contrast dye induced nephropathy, possible 09/03/2020   COPD (chronic obstructive pulmonary disease)  (HCC)    COVID-19 virus infection 03/03/2023   Diabetes (HCC)type 1    Diastolic CHF (HCC)    Dyspnea    Family history of breast cancer 10/23/2021   Gastro-esophageal reflux disease without esophagitis    Hyperlipidemia    Hypertension    Hypertensive heart disease with heart failure (HCC)    Hypoxemia    Idiopathic gout, multiple sites    Leukocytosis 03/29/2022   Localized edema    Low back pain    Mitral regurgitation    Mitral regurgitation    Mixed hyperlipidemia    Mixed incontinence    Morbid (severe) obesity due to excess calories (HCC)    Neuromuscular disorder (HCC)    Neuropathy    Non-ST elevation (NSTEMI) myocardial infarction (HCC)    08/29/20, 09/29/20   OSA (obstructive sleep apnea) 09/03/2020   Other specified hypothyroidism    PAF (paroxysmal atrial fibrillation) (HCC)    s/p pulmonary vein isolation by cryoablation 10/04/15 Dr. Rudolpho Sevin in [facility]   PEA (Pulseless electrical activity) [facility])    ~ 01/13/21 at [facility] during admission DKA, volume overload, possible CAP, hypoxia s/p ACLS with ROSC ~ 10-15   Pneumonia    Primary insomnia    Rheumatoid arthritis (HCC)    Stroke (HCC)    Thyroid disease    Type 1 diabetes mellitus (HCC)    Vitamin D deficiency    Past Surgical History:  Procedure Laterality Date   ABDOMINAL HYSTERECTOMY     Partial- Still has both ovaries   CARDIOVASCULAR STRESS TEST  08/13/2014   Nuclear; Normal   CARPAL TUNNEL RELEASE     CATARACT EXTRACTION, BILATERAL     CESAREAN SECTION     CHOLECYSTECTOMY     CORONARY ANGIOPLASTY WITH STENT PLACEMENT     3 blockages/ 1 stent   CORONARY PRESSURE/FFR STUDY N/A 09/07/2022   Procedure: INTRAVASCULAR PRESSURE WIRE/FFR STUDY;  Surgeon: Swaziland, Peter M, MD;  Location: [facility] INVASIVE CV LAB;  Service: Cardiovascular;  Laterality: N/A;  CORONARY STENT INTERVENTION N/A 09/29/2020   Procedure: CORONARY STENT INTERVENTION;  Surgeon: Tonny Bollman, MD;  Location: [facility] INVASIVE CV LAB;   Service: Cardiovascular;  Laterality: N/A;  CFX   CORONARY STENT INTERVENTION N/A 09/07/2022   Procedure: CORONARY STENT INTERVENTION;  Surgeon: Swaziland, Peter M, MD;  Location: [facility] INVASIVE CV LAB;  Service: Cardiovascular;  Laterality: N/A;   CORONARY STENT INTERVENTION N/A 02/13/2023   Procedure: CORONARY STENT INTERVENTION;  Surgeon: Kathleene Hazel, MD;  Location: [facility] INVASIVE CV LAB;  Service: Cardiovascular;  Laterality: N/A;  LAD   CORONARY ULTRASOUND/IVUS N/A 02/13/2023   Procedure: Coronary Ultrasound/IVUS;  Surgeon: Kathleene Hazel, MD;  Location: [facility] INVASIVE CV LAB;  Service: Cardiovascular;  Laterality: N/A;   CORONARY/GRAFT ACUTE MI REVASCULARIZATION N/A 12/08/2022   Procedure: Coronary/Graft Acute MI Revascularization;  Surgeon: Marykay Lex, MD;  Location: [facility] INVASIVE CV LAB;  Service: Cardiovascular;  Laterality: N/A;   EYE SURGERY     LEFT HEART CATH AND CORONARY ANGIOGRAPHY N/A 09/29/2020   Procedure: LEFT HEART CATH AND CORONARY ANGIOGRAPHY;  Surgeon: Tonny Bollman, MD;  Location: [facility] INVASIVE CV LAB;  Service: Cardiovascular;  Laterality: N/A;   LEFT HEART CATH AND CORONARY ANGIOGRAPHY N/A 09/07/2022   Procedure: LEFT HEART CATH AND CORONARY ANGIOGRAPHY;  Surgeon: Swaziland, Peter M, MD;  Location: [facility] INVASIVE CV LAB;  Service: Cardiovascular;  Laterality: N/A;   LEFT HEART CATH AND CORONARY ANGIOGRAPHY N/A 12/08/2022   Procedure: LEFT HEART CATH AND CORONARY ANGIOGRAPHY;  Surgeon: Marykay Lex, MD;  Location: [facility] INVASIVE CV LAB;  Service: Cardiovascular;  Laterality: N/A;   LEFT HEART CATH AND CORONARY ANGIOGRAPHY N/A 02/13/2023   Procedure: LEFT HEART CATH AND CORONARY ANGIOGRAPHY;  Surgeon: Kathleene Hazel, MD;  Location: [facility] INVASIVE CV LAB;  Service: Cardiovascular;  Laterality: N/A;   RIGHT HEART CATH N/A 04/27/2021   Procedure: RIGHT HEART CATH;  Surgeon: Dolores Patty, MD;  Location: [facility] INVASIVE CV LAB;  Service: Cardiovascular;  Laterality:  N/A;   RIGHT/LEFT HEART CATH AND CORONARY ANGIOGRAPHY N/A 01/07/2017   Procedure: Right/Left Heart Cath and Coronary Angiography;  Surgeon: Laurey Morale, MD;  Location: [facility] INVASIVE CV LAB;  Service: Cardiovascular;  Laterality: N/A;   ROTATOR CUFF REPAIR     TEE WITHOUT CARDIOVERSION N/A 04/28/2021   Procedure: TRANSESOPHAGEAL ECHOCARDIOGRAM (TEE);  Surgeon: Dolores Patty, MD;  Location: [facility] ENDOSCOPY;  Service: Cardiovascular;  Laterality: N/A;   TEE WITHOUT CARDIOVERSION N/A 05/18/2021   Procedure: TRANSESOPHAGEAL ECHOCARDIOGRAM (TEE);  Surgeon: Orbie Pyo, MD;  Location: [facility] INVASIVE CV LAB;  Service: Cardiovascular;  Laterality: N/A;   TOE AMPUTATION Left    TRANSCATHETER MITRAL EDGE TO EDGE REPAIR N/A 05/18/2021   Procedure: MITRAL VALVE REPAIR;  Surgeon: Orbie Pyo, MD;  Location: [facility] INVASIVE CV LAB;  Service: Cardiovascular;  Laterality: N/A;   US ECHOCARDIOGRAPHY  05/2017   Normal    Family History  Problem Relation Age of Onset   Breast cancer Mother 57       contralateral breast ca dx 27   Coronary artery disease Mother    Hypertension Mother    Hyperlipidemia Mother    Diabetes type II Mother    Lung cancer Mother 58   Diabetes Father    Hypertension Father    Mental illness Sister    Bipolar disorder Other    Depression Other    Schizophrenia Other    Cancer Other        MGF's sisters x2; unknown  type   Social History   Socioeconomic History   Marital status: Married    Spouse name: [name]   Number of children: 2   Years of education: 16   Highest education level: Master's degree (e.g., MA, MS, MEng, MEd, MSW, MBA)  Occupational History   Occupation: on disability/retired early former Engineer, site  Tobacco Use   Smoking status: Former    Current packs/day: 0.00    Types: Cigarettes    Start date: 1    Quit date: 1984    Years since quitting: 41.0    Passive exposure: Past   Smokeless tobacco: Never  Vaping Use   Vaping status: Never  Used  Substance and Sexual Activity   Alcohol use: Never   Drug use: Never   Sexual activity: Not on file  Other Topics Concern   Not on file  Social History Narrative   Not on file   Social Drivers of Health   Financial Resource Strain: Low Risk  (07/01/2023)   Overall Financial Resource Strain (CARDIA)    Difficulty of Paying Living Expenses: Not hard at all  Food Insecurity: No Food Insecurity (02/13/2023)   Hunger Vital Sign    Worried About Running Out of Food in the Last Year: Never true    Ran Out of Food in the Last Year: Never true  Transportation Needs: No Transportation Needs (02/13/2023)   PRAPARE - Administrator, Civil Service (Medical): No    Lack of Transportation (Non-Medical): No  Physical Activity: Insufficiently Active (07/01/2023)   Exercise Vital Sign    Days of Exercise per Week: 5 days    Minutes of Exercise per Session: 20 min  Stress: Stress Concern Present (07/01/2023)   [employer] of Occupational Health - Occupational Stress Questionnaire    Feeling of Stress : To some extent  Social Connections: Moderately Integrated (07/25/2022)   Social Connection and Isolation Panel [NHANES]    Frequency of Communication with Friends and Family: More than three times a week    Frequency of Social Gatherings with Friends and Family: More than three times a week    Attends Religious Services: More than 4 times per year    Active Member of [employer] or Organizations: No    Attends Engineer, structural: Never    Marital Status: Married    Objective:  BP (!) 104/58 (BP Location: Left Arm, Patient Position: Sitting)   Pulse 68   Temp 97.8 F (36.6 C) (Temporal)   Ht 5\' 5"  (1.651 m)   Wt 232 lb (105.2 kg)   SpO2 98%   BMI 38.61 kg/m      07/12/2023   10:42 AM 05/14/2023   10:52 AM 05/13/2023    8:00 AM  BP/Weight  Systolic BP 104 138 127  Diastolic BP 58 64 59  Wt. (Lbs) 232 230.8 233  BMI 38.61 kg/m2 38.41 kg/m2 38.77 kg/m2     Physical Exam  Diabetic Foot Exam - Simple   No data filed      Lab Results  Component Value Date   WBC 11.5 (H) 04/04/2023   HGB 9.6 (L) 04/04/2023   HCT 32.3 (L) 04/04/2023   PLT 243 04/04/2023   GLUCOSE 145 (H) 05/14/2023   CHOL 131 04/04/2023   TRIG 114 04/04/2023   HDL 42 04/04/2023   LDLCALC 68 04/04/2023   ALT 19 05/14/2023   AST 22 05/14/2023   NA 141 05/14/2023   K 4.5 05/14/2023   CL  102 05/14/2023   CREATININE 2.23 (H) 05/14/2023   BUN 60 (H) 05/14/2023   CO2 24 05/14/2023   TSH 2.900 01/19/2022   INR 1.1 05/16/2021   HGBA1C 9.0 (H) 05/14/2023      Assessment & Plan:    Diabetic polyneuropathy associated with type 1 diabetes mellitus (HCC)  Mixed hyperlipidemia -     Lipid panel  Hypertensive heart and renal disease with congestive heart failure (HCC) -     CBC with Differential/Platelet -     Comprehensive metabolic panel  GERD without esophagitis  Coronary artery disease of native artery of native heart with stable angina pectoris (HCC)  OSA on CPAP  Vitamin D insufficiency  Other orders -     Gvoke HypoPen 2-Pack; Inject 0.5 mg into the skin daily as needed.  Dispense: 0.2 mL; Refill: 5 -     busPIRone HCl; Take 1 tablet (30 mg total) by mouth 2 (two) times daily.  Dispense: 60 tablet; Refill: 5     Meds ordered this encounter  Medications   Glucagon (GVOKE HYPOPEN 2-PACK) 0.5 MG/0.1ML SOAJ    Sig: Inject 0.5 mg into the skin daily as needed.    Dispense:  0.2 mL    Refill:  5   busPIRone (BUSPAR) 30 MG tablet    Sig: Take 1 tablet (30 mg total) by mouth 2 (two) times daily.    Dispense:  60 tablet    Refill:  5    Put on file. Patient to use up 5 mg 2 three times a day.    Orders Placed This Encounter  Procedures   CBC with Differential/Platelet   Comprehensive metabolic panel   Lipid panel     Follow-up: Return in about 4 months (around 11/10/2023) for chronic follow up.   I,Marla I Leal-Borjas,acting as a scribe  for Blane Ohara, MD.,have documented all relevant documentation on the behalf of Blane Ohara, MD,as directed by  Blane Ohara, MD while in the presence of Blane Ohara, MD.   An After Visit Summary was printed and given to the patient.  Blane Ohara, MD [facility] [phone]

## 2023-07-12 ENCOUNTER — Other Ambulatory Visit: Payer: Self-pay

## 2023-07-12 ENCOUNTER — Ambulatory Visit: Payer: PPO | Admitting: Family Medicine

## 2023-07-12 ENCOUNTER — Other Ambulatory Visit (HOSPITAL_COMMUNITY): Payer: Self-pay

## 2023-07-12 ENCOUNTER — Encounter: Payer: Self-pay | Admitting: Family Medicine

## 2023-07-12 VITALS — BP 104/58 | HR 68 | Temp 97.8°F | Ht 65.0 in | Wt 232.0 lb

## 2023-07-12 DIAGNOSIS — K219 Gastro-esophageal reflux disease without esophagitis: Secondary | ICD-10-CM | POA: Diagnosis not present

## 2023-07-12 DIAGNOSIS — I25118 Atherosclerotic heart disease of native coronary artery with other forms of angina pectoris: Secondary | ICD-10-CM

## 2023-07-12 DIAGNOSIS — I130 Hypertensive heart and chronic kidney disease with heart failure and stage 1 through stage 4 chronic kidney disease, or unspecified chronic kidney disease: Secondary | ICD-10-CM | POA: Diagnosis not present

## 2023-07-12 DIAGNOSIS — E559 Vitamin D deficiency, unspecified: Secondary | ICD-10-CM

## 2023-07-12 DIAGNOSIS — G4733 Obstructive sleep apnea (adult) (pediatric): Secondary | ICD-10-CM

## 2023-07-12 DIAGNOSIS — M25561 Pain in right knee: Secondary | ICD-10-CM

## 2023-07-12 DIAGNOSIS — E1142 Type 2 diabetes mellitus with diabetic polyneuropathy: Secondary | ICD-10-CM

## 2023-07-12 DIAGNOSIS — E782 Mixed hyperlipidemia: Secondary | ICD-10-CM | POA: Diagnosis not present

## 2023-07-12 DIAGNOSIS — F331 Major depressive disorder, recurrent, moderate: Secondary | ICD-10-CM

## 2023-07-12 DIAGNOSIS — E1042 Type 1 diabetes mellitus with diabetic polyneuropathy: Secondary | ICD-10-CM

## 2023-07-12 DIAGNOSIS — Z95811 Presence of heart assist device: Secondary | ICD-10-CM | POA: Diagnosis not present

## 2023-07-12 MED ORDER — BUSPIRONE HCL 30 MG PO TABS
30.0000 mg | ORAL_TABLET | Freq: Two times a day (BID) | ORAL | 5 refills | Status: DC
  Filled 2023-07-12: qty 60, 30d supply, fill #0

## 2023-07-12 MED ORDER — GVOKE HYPOPEN 2-PACK 0.5 MG/0.1ML ~~LOC~~ SOAJ
0.5000 mg | Freq: Every day | SUBCUTANEOUS | 5 refills | Status: DC | PRN
  Filled 2023-07-12: qty 0.2, 30d supply, fill #0
  Filled 2023-08-13: qty 0.2, 30d supply, fill #1
  Filled 2023-09-01 – 2023-09-29 (×2): qty 0.2, 30d supply, fill #2
  Filled 2023-10-16: qty 0.2, 2d supply, fill #3
  Filled 2024-02-27: qty 0.2, 30d supply, fill #3
  Filled 2024-03-25: qty 0.2, 30d supply, fill #4

## 2023-07-13 ENCOUNTER — Emergency Department (HOSPITAL_COMMUNITY): Payer: PPO

## 2023-07-13 ENCOUNTER — Inpatient Hospital Stay (HOSPITAL_COMMUNITY)
Admission: EM | Admit: 2023-07-13 | Discharge: 2023-07-20 | DRG: 250 | Disposition: A | Discharge status: Home / Self Care | Payer: PPO | Attending: Family Medicine | Admitting: Family Medicine

## 2023-07-13 ENCOUNTER — Encounter (HOSPITAL_COMMUNITY): Payer: Self-pay

## 2023-07-13 DIAGNOSIS — E782 Mixed hyperlipidemia: Secondary | ICD-10-CM | POA: Diagnosis present

## 2023-07-13 DIAGNOSIS — R0989 Other specified symptoms and signs involving the circulatory and respiratory systems: Secondary | ICD-10-CM | POA: Diagnosis not present

## 2023-07-13 DIAGNOSIS — J449 Chronic obstructive pulmonary disease, unspecified: Secondary | ICD-10-CM | POA: Diagnosis present

## 2023-07-13 DIAGNOSIS — M549 Dorsalgia, unspecified: Secondary | ICD-10-CM | POA: Diagnosis not present

## 2023-07-13 DIAGNOSIS — N179 Acute kidney failure, unspecified: Secondary | ICD-10-CM | POA: Diagnosis present

## 2023-07-13 DIAGNOSIS — Z8249 Family history of ischemic heart disease and other diseases of the circulatory system: Secondary | ICD-10-CM

## 2023-07-13 DIAGNOSIS — B961 Klebsiella pneumoniae [K. pneumoniae] as the cause of diseases classified elsewhere: Secondary | ICD-10-CM | POA: Diagnosis present

## 2023-07-13 DIAGNOSIS — Z95818 Presence of other cardiac implants and grafts: Secondary | ICD-10-CM

## 2023-07-13 DIAGNOSIS — M069 Rheumatoid arthritis, unspecified: Secondary | ICD-10-CM | POA: Diagnosis present

## 2023-07-13 DIAGNOSIS — R079 Chest pain, unspecified: Secondary | ICD-10-CM | POA: Diagnosis not present

## 2023-07-13 DIAGNOSIS — Z888 Allergy status to other drugs, medicaments and biological substances status: Secondary | ICD-10-CM | POA: Diagnosis not present

## 2023-07-13 DIAGNOSIS — E1165 Type 2 diabetes mellitus with hyperglycemia: Secondary | ICD-10-CM | POA: Diagnosis not present

## 2023-07-13 DIAGNOSIS — Z79899 Other long term (current) drug therapy: Secondary | ICD-10-CM

## 2023-07-13 DIAGNOSIS — R590 Localized enlarged lymph nodes: Secondary | ICD-10-CM | POA: Diagnosis not present

## 2023-07-13 DIAGNOSIS — I5030 Unspecified diastolic (congestive) heart failure: Secondary | ICD-10-CM | POA: Diagnosis not present

## 2023-07-13 DIAGNOSIS — I2089 Other forms of angina pectoris: Secondary | ICD-10-CM | POA: Diagnosis not present

## 2023-07-13 DIAGNOSIS — Z87891 Personal history of nicotine dependence: Secondary | ICD-10-CM

## 2023-07-13 DIAGNOSIS — Y831 Surgical operation with implant of artificial internal device as the cause of abnormal reaction of the patient, or of later complication, without mention of misadventure at the time of the procedure: Secondary | ICD-10-CM | POA: Diagnosis present

## 2023-07-13 DIAGNOSIS — Z89422 Acquired absence of other left toe(s): Secondary | ICD-10-CM

## 2023-07-13 DIAGNOSIS — Z6841 Body Mass Index (BMI) 40.0 and over, adult: Secondary | ICD-10-CM | POA: Diagnosis not present

## 2023-07-13 DIAGNOSIS — I130 Hypertensive heart and chronic kidney disease with heart failure and stage 1 through stage 4 chronic kidney disease, or unspecified chronic kidney disease: Secondary | ICD-10-CM | POA: Diagnosis not present

## 2023-07-13 DIAGNOSIS — N390 Urinary tract infection, site not specified: Secondary | ICD-10-CM | POA: Diagnosis present

## 2023-07-13 DIAGNOSIS — Z83438 Family history of other disorder of lipoprotein metabolism and other lipidemia: Secondary | ICD-10-CM

## 2023-07-13 DIAGNOSIS — E1022 Type 1 diabetes mellitus with diabetic chronic kidney disease: Secondary | ICD-10-CM | POA: Diagnosis present

## 2023-07-13 DIAGNOSIS — F419 Anxiety disorder, unspecified: Secondary | ICD-10-CM | POA: Diagnosis present

## 2023-07-13 DIAGNOSIS — N1832 Chronic kidney disease, stage 3b: Secondary | ICD-10-CM | POA: Diagnosis not present

## 2023-07-13 DIAGNOSIS — Z89412 Acquired absence of left great toe: Secondary | ICD-10-CM

## 2023-07-13 DIAGNOSIS — I209 Angina pectoris, unspecified: Secondary | ICD-10-CM | POA: Diagnosis present

## 2023-07-13 DIAGNOSIS — I480 Paroxysmal atrial fibrillation: Secondary | ICD-10-CM | POA: Diagnosis present

## 2023-07-13 DIAGNOSIS — J9601 Acute respiratory failure with hypoxia: Secondary | ICD-10-CM | POA: Diagnosis not present

## 2023-07-13 DIAGNOSIS — Z794 Long term (current) use of insulin: Secondary | ICD-10-CM

## 2023-07-13 DIAGNOSIS — G8929 Other chronic pain: Secondary | ICD-10-CM | POA: Diagnosis not present

## 2023-07-13 DIAGNOSIS — K219 Gastro-esophageal reflux disease without esophagitis: Secondary | ICD-10-CM | POA: Diagnosis present

## 2023-07-13 DIAGNOSIS — F331 Major depressive disorder, recurrent, moderate: Secondary | ICD-10-CM | POA: Insufficient documentation

## 2023-07-13 DIAGNOSIS — Z7902 Long term (current) use of antithrombotics/antiplatelets: Secondary | ICD-10-CM

## 2023-07-13 DIAGNOSIS — R0789 Other chest pain: Secondary | ICD-10-CM | POA: Diagnosis not present

## 2023-07-13 DIAGNOSIS — N184 Chronic kidney disease, stage 4 (severe): Secondary | ICD-10-CM | POA: Diagnosis present

## 2023-07-13 DIAGNOSIS — Z9861 Coronary angioplasty status: Secondary | ICD-10-CM

## 2023-07-13 DIAGNOSIS — T82855A Stenosis of coronary artery stent, initial encounter: Secondary | ICD-10-CM | POA: Diagnosis not present

## 2023-07-13 DIAGNOSIS — Z801 Family history of malignant neoplasm of trachea, bronchus and lung: Secondary | ICD-10-CM

## 2023-07-13 DIAGNOSIS — Z8674 Personal history of sudden cardiac arrest: Secondary | ICD-10-CM

## 2023-07-13 DIAGNOSIS — M25561 Pain in right knee: Secondary | ICD-10-CM | POA: Insufficient documentation

## 2023-07-13 DIAGNOSIS — R739 Hyperglycemia, unspecified: Secondary | ICD-10-CM | POA: Diagnosis not present

## 2023-07-13 DIAGNOSIS — I517 Cardiomegaly: Secondary | ICD-10-CM | POA: Diagnosis not present

## 2023-07-13 DIAGNOSIS — E10649 Type 1 diabetes mellitus with hypoglycemia without coma: Secondary | ICD-10-CM | POA: Diagnosis not present

## 2023-07-13 DIAGNOSIS — I25110 Atherosclerotic heart disease of native coronary artery with unstable angina pectoris: Secondary | ICD-10-CM | POA: Diagnosis not present

## 2023-07-13 DIAGNOSIS — I5033 Acute on chronic diastolic (congestive) heart failure: Secondary | ICD-10-CM | POA: Diagnosis not present

## 2023-07-13 DIAGNOSIS — I214 Non-ST elevation (NSTEMI) myocardial infarction: Secondary | ICD-10-CM | POA: Diagnosis not present

## 2023-07-13 DIAGNOSIS — G4733 Obstructive sleep apnea (adult) (pediatric): Secondary | ICD-10-CM | POA: Diagnosis present

## 2023-07-13 DIAGNOSIS — Z91018 Allergy to other foods: Secondary | ICD-10-CM

## 2023-07-13 DIAGNOSIS — Z886 Allergy status to analgesic agent status: Secondary | ICD-10-CM | POA: Diagnosis not present

## 2023-07-13 DIAGNOSIS — D631 Anemia in chronic kidney disease: Secondary | ICD-10-CM | POA: Diagnosis not present

## 2023-07-13 DIAGNOSIS — M1009 Idiopathic gout, multiple sites: Secondary | ICD-10-CM | POA: Diagnosis present

## 2023-07-13 DIAGNOSIS — I509 Heart failure, unspecified: Secondary | ICD-10-CM | POA: Diagnosis not present

## 2023-07-13 DIAGNOSIS — Z803 Family history of malignant neoplasm of breast: Secondary | ICD-10-CM

## 2023-07-13 DIAGNOSIS — Q272 Other congenital malformations of renal artery: Secondary | ICD-10-CM | POA: Diagnosis not present

## 2023-07-13 DIAGNOSIS — I25119 Atherosclerotic heart disease of native coronary artery with unspecified angina pectoris: Secondary | ICD-10-CM | POA: Diagnosis not present

## 2023-07-13 DIAGNOSIS — F411 Generalized anxiety disorder: Secondary | ICD-10-CM | POA: Diagnosis not present

## 2023-07-13 DIAGNOSIS — Z9889 Other specified postprocedural states: Secondary | ICD-10-CM | POA: Diagnosis not present

## 2023-07-13 DIAGNOSIS — Z90710 Acquired absence of both cervix and uterus: Secondary | ICD-10-CM

## 2023-07-13 DIAGNOSIS — I129 Hypertensive chronic kidney disease with stage 1 through stage 4 chronic kidney disease, or unspecified chronic kidney disease: Secondary | ICD-10-CM | POA: Diagnosis not present

## 2023-07-13 DIAGNOSIS — Z95811 Presence of heart assist device: Secondary | ICD-10-CM | POA: Insufficient documentation

## 2023-07-13 DIAGNOSIS — I252 Old myocardial infarction: Secondary | ICD-10-CM

## 2023-07-13 DIAGNOSIS — E1065 Type 1 diabetes mellitus with hyperglycemia: Secondary | ICD-10-CM | POA: Diagnosis present

## 2023-07-13 DIAGNOSIS — Z8616 Personal history of COVID-19: Secondary | ICD-10-CM | POA: Diagnosis not present

## 2023-07-13 DIAGNOSIS — Z8673 Personal history of transient ischemic attack (TIA), and cerebral infarction without residual deficits: Secondary | ICD-10-CM

## 2023-07-13 DIAGNOSIS — D649 Anemia, unspecified: Secondary | ICD-10-CM | POA: Diagnosis not present

## 2023-07-13 DIAGNOSIS — Z818 Family history of other mental and behavioral disorders: Secondary | ICD-10-CM

## 2023-07-13 DIAGNOSIS — Z91013 Allergy to seafood: Secondary | ICD-10-CM

## 2023-07-13 DIAGNOSIS — Z833 Family history of diabetes mellitus: Secondary | ICD-10-CM

## 2023-07-13 DIAGNOSIS — Z9981 Dependence on supplemental oxygen: Secondary | ICD-10-CM

## 2023-07-13 DIAGNOSIS — Z7982 Long term (current) use of aspirin: Secondary | ICD-10-CM

## 2023-07-13 LAB — BASIC METABOLIC PANEL
Anion gap: 11 (ref 5–15)
BUN: 67 mg/dL — ABNORMAL HIGH (ref 6–20)
CO2: 22 mmol/L (ref 22–32)
Calcium: 8.7 mg/dL — ABNORMAL LOW (ref 8.9–10.3)
Chloride: 101 mmol/L (ref 98–111)
Creatinine, Ser: 1.83 mg/dL — ABNORMAL HIGH (ref 0.44–1.00)
GFR, Estimated: 31 mL/min — ABNORMAL LOW (ref >60)
Glucose, Bld: 328 mg/dL — ABNORMAL HIGH (ref 70–99)
Potassium: 4.3 mmol/L (ref 3.5–5.1)
Sodium: 134 mmol/L — ABNORMAL LOW (ref 135–145)

## 2023-07-13 LAB — CBC WITH DIFFERENTIAL/PLATELET
Basophils Absolute: 0.1 10*3/uL (ref 0.0–0.2)
Basos: 1 %
EOS (ABSOLUTE): 0.3 10*3/uL (ref 0.0–0.4)
Eos: 3 %
Hematocrit: 31.5 % — ABNORMAL LOW (ref 34.0–46.6)
Hemoglobin: 9.9 g/dL — ABNORMAL LOW (ref 11.1–15.9)
Immature Grans (Abs): 0 10*3/uL (ref 0.0–0.1)
Immature Granulocytes: 0 %
Lymphocytes Absolute: 1.8 10*3/uL (ref 0.7–3.1)
Lymphs: 17 %
MCH: 27.7 pg (ref 26.6–33.0)
MCHC: 31.4 g/dL — ABNORMAL LOW (ref 31.5–35.7)
MCV: 88 fL (ref 79–97)
Monocytes Absolute: 0.8 10*3/uL (ref 0.1–0.9)
Monocytes: 7 %
Neutrophils Absolute: 8 10*3/uL — ABNORMAL HIGH (ref 1.4–7.0)
Neutrophils: 72 %
Platelets: 256 10*3/uL (ref 150–450)
RBC: 3.58 x10E6/uL — ABNORMAL LOW (ref 3.77–5.28)
RDW: 13.9 % (ref 11.7–15.4)
WBC: 11 10*3/uL — ABNORMAL HIGH (ref 3.4–10.8)

## 2023-07-13 LAB — URINALYSIS, W/ REFLEX TO CULTURE (INFECTION SUSPECTED)
Bilirubin Urine: NEGATIVE
Glucose, UA: 50 mg/dL — AB
Hgb urine dipstick: NEGATIVE
Ketones, ur: NEGATIVE mg/dL
Nitrite: NEGATIVE
Protein, ur: 30 mg/dL — AB
Specific Gravity, Urine: 1.01 (ref 1.005–1.030)
pH: 5 (ref 5.0–8.0)

## 2023-07-13 LAB — CBC
HCT: 32.1 % — ABNORMAL LOW (ref 36.0–46.0)
Hemoglobin: 10.1 g/dL — ABNORMAL LOW (ref 12.0–15.0)
MCH: 27.6 pg (ref 26.0–34.0)
MCHC: 31.5 g/dL (ref 30.0–36.0)
MCV: 87.7 fL (ref 80.0–100.0)
Platelets: 250 10*3/uL (ref 150–400)
RBC: 3.66 MIL/uL — ABNORMAL LOW (ref 3.87–5.11)
RDW: 14.4 % (ref 11.5–15.5)
WBC: 14.6 10*3/uL — ABNORMAL HIGH (ref 4.0–10.5)
nRBC: 0 % (ref 0.0–0.2)

## 2023-07-13 LAB — I-STAT VENOUS BLOOD GAS, ED
Acid-Base Excess: 0 mmol/L (ref 0.0–2.0)
Bicarbonate: 25.6 mmol/L (ref 20.0–28.0)
Calcium, Ion: 1.12 mmol/L — ABNORMAL LOW (ref 1.15–1.40)
HCT: 33 % — ABNORMAL LOW (ref 36.0–46.0)
Hemoglobin: 11.2 g/dL — ABNORMAL LOW (ref 12.0–15.0)
O2 Saturation: 53 %
Potassium: 4.2 mmol/L (ref 3.5–5.1)
Sodium: 138 mmol/L (ref 135–145)
TCO2: 27 mmol/L (ref 22–32)
pCO2, Ven: 44.7 mm[Hg] (ref 44–60)
pH, Ven: 7.366 (ref 7.25–7.43)
pO2, Ven: 29 mm[Hg] — CL (ref 32–45)

## 2023-07-13 LAB — CBG MONITORING, ED
Glucose-Capillary: 269 mg/dL — ABNORMAL HIGH (ref 70–99)
Glucose-Capillary: 319 mg/dL — ABNORMAL HIGH (ref 70–99)
Glucose-Capillary: 219 mg/dL — ABNORMAL HIGH (ref 70–99)

## 2023-07-13 LAB — COMPREHENSIVE METABOLIC PANEL
ALT: 25 [IU]/L (ref 0–32)
AST: 21 [IU]/L (ref 0–40)
Albumin: 4 g/dL (ref 3.8–4.9)
Alkaline Phosphatase: 226 [IU]/L — ABNORMAL HIGH (ref 44–121)
BUN/Creatinine Ratio: 34 — ABNORMAL HIGH (ref 12–28)
BUN: 70 mg/dL — ABNORMAL HIGH (ref 8–27)
Bilirubin Total: 0.5 mg/dL (ref 0.0–1.2)
CO2: 22 mmol/L (ref 20–29)
Calcium: 9.2 mg/dL (ref 8.7–10.3)
Chloride: 100 mmol/L (ref 96–106)
Creatinine, Ser: 2.03 mg/dL — ABNORMAL HIGH (ref 0.57–1.00)
Globulin, Total: 3.3 g/dL (ref 1.5–4.5)
Glucose: 162 mg/dL — ABNORMAL HIGH (ref 70–99)
Potassium: 4.4 mmol/L (ref 3.5–5.2)
Sodium: 139 mmol/L (ref 134–144)
Total Protein: 7.3 g/dL (ref 6.0–8.5)
eGFR: 28 mL/min/{1.73_m2} — ABNORMAL LOW (ref >59)

## 2023-07-13 LAB — LIPID PANEL
Chol/HDL Ratio: 2 {ratio} (ref 0.0–4.4)
Cholesterol, Total: 92 mg/dL — ABNORMAL LOW (ref 100–199)
HDL: 47 mg/dL (ref >39)
LDL Chol Calc (NIH): 28 mg/dL (ref 0–99)
Triglycerides: 81 mg/dL (ref 0–149)
VLDL Cholesterol Cal: 17 mg/dL (ref 5–40)

## 2023-07-13 LAB — TROPONIN I (HIGH SENSITIVITY)
Troponin I (High Sensitivity): 21 ng/L — ABNORMAL HIGH (ref <18)
Troponin I (High Sensitivity): 31 ng/L — ABNORMAL HIGH (ref <18)

## 2023-07-13 LAB — BETA-HYDROXYBUTYRIC ACID: Beta-Hydroxybutyric Acid: 0.3 mmol/L — ABNORMAL HIGH (ref 0.05–0.27)

## 2023-07-13 LAB — BRAIN NATRIURETIC PEPTIDE: B Natriuretic Peptide: 212.4 pg/mL — ABNORMAL HIGH (ref 0.0–100.0)

## 2023-07-13 MED ORDER — ASPIRIN 81 MG PO TBEC
81.0000 mg | DELAYED_RELEASE_TABLET | Freq: Every day | ORAL | Status: DC
  Administered 2023-07-14 – 2023-07-20 (×6): 81 mg via ORAL
  Filled 2023-07-13 (×6): qty 1

## 2023-07-13 MED ORDER — ONDANSETRON HCL 4 MG/2ML IJ SOLN
4.0000 mg | Freq: Once | INTRAMUSCULAR | Status: AC
  Administered 2023-07-13: 4 mg via INTRAVENOUS
  Filled 2023-07-13: qty 2

## 2023-07-13 MED ORDER — HYDROCODONE-ACETAMINOPHEN 7.5-325 MG PO TABS
1.0000 | ORAL_TABLET | Freq: Three times a day (TID) | ORAL | Status: DC | PRN
  Administered 2023-07-13 – 2023-07-20 (×13): 1 via ORAL
  Filled 2023-07-13 (×14): qty 1

## 2023-07-13 MED ORDER — IOHEXOL 350 MG/ML SOLN
75.0000 mL | Freq: Once | INTRAVENOUS | Status: AC | PRN
  Administered 2023-07-13: 75 mL via INTRAVENOUS

## 2023-07-13 MED ORDER — TICAGRELOR 90 MG PO TABS
90.0000 mg | ORAL_TABLET | Freq: Two times a day (BID) | ORAL | Status: DC
  Administered 2023-07-13 – 2023-07-20 (×14): 90 mg via ORAL
  Filled 2023-07-13 (×14): qty 1

## 2023-07-13 MED ORDER — LORAZEPAM 0.5 MG PO TABS
0.5000 mg | ORAL_TABLET | Freq: Every day | ORAL | Status: DC | PRN
  Administered 2023-07-14: 0.5 mg via ORAL
  Filled 2023-07-13: qty 1

## 2023-07-13 MED ORDER — INSULIN GLARGINE-YFGN 100 UNIT/ML ~~LOC~~ SOLN
40.0000 [IU] | Freq: Every day | SUBCUTANEOUS | Status: DC
  Administered 2023-07-13 – 2023-07-14 (×2): 40 [IU] via SUBCUTANEOUS
  Filled 2023-07-13 (×3): qty 0.4

## 2023-07-13 MED ORDER — CARVEDILOL 12.5 MG PO TABS
12.5000 mg | ORAL_TABLET | Freq: Two times a day (BID) | ORAL | Status: DC
  Administered 2023-07-13 – 2023-07-20 (×14): 12.5 mg via ORAL
  Filled 2023-07-13 (×14): qty 1

## 2023-07-13 MED ORDER — SODIUM CHLORIDE 0.9 % IV SOLN
1.0000 g | INTRAVENOUS | Status: DC
  Administered 2023-07-14: 1 g via INTRAVENOUS
  Filled 2023-07-13: qty 10

## 2023-07-13 MED ORDER — EZETIMIBE 10 MG PO TABS
10.0000 mg | ORAL_TABLET | Freq: Every evening | ORAL | Status: DC
  Administered 2023-07-13 – 2023-07-19 (×6): 10 mg via ORAL
  Filled 2023-07-13 (×6): qty 1

## 2023-07-13 MED ORDER — ENOXAPARIN SODIUM 40 MG/0.4ML IJ SOSY
40.0000 mg | PREFILLED_SYRINGE | INTRAMUSCULAR | Status: DC
  Administered 2023-07-15: 40 mg via SUBCUTANEOUS
  Filled 2023-07-13: qty 0.4

## 2023-07-13 MED ORDER — PANTOPRAZOLE SODIUM 40 MG PO TBEC
40.0000 mg | DELAYED_RELEASE_TABLET | Freq: Every day | ORAL | Status: DC
  Administered 2023-07-13 – 2023-07-20 (×8): 40 mg via ORAL
  Filled 2023-07-13 (×8): qty 1

## 2023-07-13 MED ORDER — GABAPENTIN 100 MG PO CAPS
100.0000 mg | ORAL_CAPSULE | Freq: Two times a day (BID) | ORAL | Status: DC
  Administered 2023-07-13 – 2023-07-20 (×14): 100 mg via ORAL
  Filled 2023-07-13 (×14): qty 1

## 2023-07-13 MED ORDER — NITROGLYCERIN IN D5W 200-5 MCG/ML-% IV SOLN
0.0000 ug/min | INTRAVENOUS | Status: DC
Start: 2023-07-13 — End: 2023-07-18
  Administered 2023-07-13: 5 ug/min via INTRAVENOUS
  Administered 2023-07-17: 10 ug/min via INTRAVENOUS
  Filled 2023-07-13 (×2): qty 250

## 2023-07-13 MED ORDER — INSULIN ASPART 100 UNIT/ML IJ SOLN
0.0000 [IU] | Freq: Every day | INTRAMUSCULAR | Status: DC
  Administered 2023-07-13: 2 [IU] via SUBCUTANEOUS

## 2023-07-13 MED ORDER — MORPHINE SULFATE (PF) 4 MG/ML IV SOLN
4.0000 mg | Freq: Once | INTRAVENOUS | Status: AC
  Administered 2023-07-13: 4 mg via INTRAVENOUS
  Filled 2023-07-13: qty 1

## 2023-07-13 MED ORDER — ONDANSETRON HCL 4 MG/2ML IJ SOLN
4.0000 mg | Freq: Four times a day (QID) | INTRAMUSCULAR | Status: DC | PRN
  Administered 2023-07-17 (×2): 4 mg via INTRAVENOUS
  Filled 2023-07-13 (×2): qty 2

## 2023-07-13 MED ORDER — SODIUM CHLORIDE 0.9 % IV SOLN
1.0000 g | Freq: Once | INTRAVENOUS | Status: AC
  Administered 2023-07-13: 1 g via INTRAVENOUS
  Filled 2023-07-13: qty 10

## 2023-07-13 MED ORDER — ACETAMINOPHEN 325 MG PO TABS: 650.0000 mg | ORAL_TABLET | Freq: Four times a day (QID) | ORAL | Status: DC | PRN

## 2023-07-13 MED ORDER — ONDANSETRON HCL 4 MG PO TABS: 4.0000 mg | ORAL_TABLET | Freq: Four times a day (QID) | ORAL | Status: DC | PRN

## 2023-07-13 MED ORDER — INSULIN ASPART 100 UNIT/ML IJ SOLN
0.0000 [IU] | Freq: Three times a day (TID) | INTRAMUSCULAR | Status: DC
  Administered 2023-07-14: 2 [IU] via SUBCUTANEOUS

## 2023-07-13 NOTE — Assessment & Plan Note (Addendum)
Continue cpap with 2 L of oxygen.

## 2023-07-13 NOTE — ED Provider Notes (Signed)
Care transferred to me.  Patient's chest pain is better though she is having some significant pressure and radiation down her left arm.  Troponins are minimally elevated though did go from 21 up to 31.   4:19 PM Dr. Rennis Golden will consult.  However, given minimal troponin elevation, EKG appears improved since July/August, does not think she needs heparin but can continue the nitro since is helping with pain.  Can admit to the hospitalist. CTA official read is pending.  4:49 PM Patient's chest pressure is improving.  CT does not show dissection but does show possible pulmonary edema as well as some gas in the bladder.  No recent instrumentation though she has been having some dysuria for the past few weeks.  Will check a urine test.  Will admit.  5:37 PM Dr. Kirke Corin will admit. Urine c/w UTI, will give rocephin.   EKG Interpretation Date/Time:  Saturday July 13 2023 16:16:01 EST Ventricular Rate:  81 PR Interval:  172 QRS Duration:  106 QT Interval:  419 QTC Calculation: 487 R Axis:   103  Text Interpretation: Sinus rhythm Lateral infarct, old similar to earlier in the day, better than Aug/July 2024 Confirmed by Pricilla Loveless [phone]) on 07/13/2023 4:20:50 PM          Pricilla Loveless, MD 07/13/23 1737

## 2023-07-13 NOTE — ED Notes (Signed)
Pt to CT

## 2023-07-13 NOTE — Assessment & Plan Note (Signed)
Management per specialist.  Continue Carvedilol 12.5 mg two times daily, Imdur 60 mg daily, Torsemide 80 mg daily, Potassium 20 meq once daily, Brilanta 90 mg TWICE DAILY. See's Dr. Patrica Duel at [facility]. Weighs daily.  Labs drawn today.

## 2023-07-13 NOTE — Assessment & Plan Note (Signed)
Patient reports increased stress at home due to family dynamics. Currently on Buspar 30mg  daily. -Increase Buspar to 60mg  daily, 30mg  in the morning and 30mg  at night.

## 2023-07-13 NOTE — ED Triage Notes (Signed)
Patient arrived from home by EMS with complaint of chest pain that awoke her from sleep this am at 930am. Radiation to left arm and nausea with same. Took 324 asa prior to ems arrival and 3 sl ntg, ems gave additional sl ntg enroute. Patient has not taken lantus today and CBG 414 by ems

## 2023-07-13 NOTE — Assessment & Plan Note (Signed)
The current medical regimen is effective;  continue present plan and medications.  

## 2023-07-13 NOTE — Assessment & Plan Note (Signed)
Patient reports right knee pain, especially after sitting for prolonged periods. -Consider knee injection if pain worsens.

## 2023-07-13 NOTE — Assessment & Plan Note (Signed)
The current medical regimen is effective;  continue present plan and medications. Continue protonix 40 mg daily.  

## 2023-07-13 NOTE — Assessment & Plan Note (Signed)
Well controlled.  No changes to medicines. Continue  Repatha 140 mg every other week, Zetia 10 mg daily, Fish oil 1000 mg daily.  Continue to work on eating a healthy diet and exercise.  Labs drawn today.

## 2023-07-13 NOTE — H&P (Addendum)
History and Physical    Patient: Dana [name]:[id number] DOB: 1960/08/27 DOA: 07/13/2023 DOS: the patient was seen and examined on 07/13/2023 PCP: Blane Ohara, MD  Patient coming from: Home  Chief Complaint:  Chief Complaint  Patient presents with   Chest Pain   HPI: Dana [name] is a 62 y.o. female with medical history significant of anxiety, chronic back pain, CKD 3B, OSA on CPAP, COPD, diastolic HF, HTN, HLD, gout, GERD, T1DM, and multivessel CAD/NSTEMI s/p multiple PCI's and complications including contrast-induced nephropathy who presents with chest pain. Patient reports she woke up around 5:30 AM sweating and shaking.  She knew her blood sugar was low so she ate some cake with improvement in her symptoms. She went back to sleep and around 9:30am, she woke up with left arm pain. This was similar to her previous MI so she took 1 nitroglycerin and after 5 minutes she took a second nitroglycerin. She thought the cake may be causing reflux symptoms so she took antiacid and this did not improve her pain.  She started having some chest pressure so she later took a third nitroglycerin and when it did not improve her pain, she called 911.  She was instructed to take 4 baby aspirin and en route to the ED, she was given another nitroglycerin tab by EMS.  States she took her Brilinta last night. Her chest pain improved while in the ED but after she was laid down for the CT it worsened with radiation to her back. States she was given some morphine and started on a nitro drip which helped improve her chest pain. She reports some associated nausea but no vomiting, headache, dizziness, shortness of breath, palpitations or vision changes.  She does endorse her urine being cloudy with strong odor over the last few days but denies any dysuria, fevers, chills or abdominal pain  ED course: Initial vitals with temp 97.9, RR 18, HR 86, BP 157/68, SpO2 96% on room air Labs show CBG 269, sodium 134, K+ 4.3,  blood glucose 328, BUN/creatinine 67/1.83 (from 70/2.03 yesterday), troponin 21->31, BNP 212, VBG with pH 7.36, pCO2 44.7, pO2 29, bicarb 25.6, BHB 0.30, UA shows mild glucosuria and proteinuria, negative nitrite, moderate leuks, WBC 21-50 and many bacteria. EKG with sinus rhythm and no acute ischemic changes CXR stable cardiomegaly and pulmonary vascular congestion but no active infiltrate CTA chest/abdomen/pelvis dissection study shows no evidence of thoracoabdominal aortic aneurysm, aortic dissection or pulmonary embolism but shows some interstitial edema. Patient was started on nitro drip Cardiology was consulted for evaluation [facility] was consulted for admission  Review of Systems: As mentioned in the history of present illness. All other systems reviewed and are negative. Past Medical History:  Diagnosis Date   Acquired absence of left great toe (HCC)    Acquired absence of other left toe(s) (HCC)    ACS (acute coronary syndrome) (HCC)    Anemia    Anoxic brain injury (HCC)    Anxiety    CAD (coronary artery disease)    DES mLAD 04/07/15; DES dRCA & DES pPDA 08/30/16; DES mCX & PTCA OM1 09/29/20   Chronic diastolic (congestive) heart failure (HCC)    Chronic pain    Chronic systolic (congestive) heart failure (HCC)    CKD (chronic kidney disease)    Contrast dye induced nephropathy, possible 09/03/2020   COPD (chronic obstructive pulmonary disease) (HCC)    COVID-19 virus infection 03/03/2023   Diabetes (HCC)type 1    Diastolic CHF (  HCC)    Dyspnea    Family history of breast cancer 10/23/2021   Gastro-esophageal reflux disease without esophagitis    Hyperlipidemia    Hypertension    Hypertensive heart disease with heart failure (HCC)    Hypoxemia    Idiopathic gout, multiple sites    Leukocytosis 03/29/2022   Localized edema    Low back pain    Mitral regurgitation    Mitral regurgitation    Mixed hyperlipidemia    Mixed incontinence    Morbid (severe) obesity due to excess  calories (HCC)    Neuromuscular disorder (HCC)    Neuropathy    Non-ST elevation (NSTEMI) myocardial infarction (HCC)    08/29/20, 09/29/20   OSA (obstructive sleep apnea) 09/03/2020   Other specified hypothyroidism    PAF (paroxysmal atrial fibrillation) (HCC)    s/p pulmonary vein isolation by cryoablation 10/04/15 Dr. Rudolpho Sevin in [facility]   PEA (Pulseless electrical activity) [facility])    ~ 01/13/21 at [facility] during admission DKA, volume overload, possible CAP, hypoxia s/p ACLS with ROSC ~ 10-15   Pneumonia    Primary insomnia    Rheumatoid arthritis (HCC)    Stroke (HCC)    Thyroid disease    Type 1 diabetes mellitus (HCC)    Vitamin D deficiency    Past Surgical History:  Procedure Laterality Date   ABDOMINAL HYSTERECTOMY     Partial- Still has both ovaries   CARDIOVASCULAR STRESS TEST  08/13/2014   Nuclear; Normal   CARPAL TUNNEL RELEASE     CATARACT EXTRACTION, BILATERAL     CESAREAN SECTION     CHOLECYSTECTOMY     CORONARY ANGIOPLASTY WITH STENT PLACEMENT     3 blockages/ 1 stent   CORONARY PRESSURE/FFR STUDY N/A 09/07/2022   Procedure: INTRAVASCULAR PRESSURE WIRE/FFR STUDY;  Surgeon: Swaziland, Peter M, MD;  Location: [facility] INVASIVE CV LAB;  Service: Cardiovascular;  Laterality: N/A;   CORONARY STENT INTERVENTION N/A 09/29/2020   Procedure: CORONARY STENT INTERVENTION;  Surgeon: Tonny Bollman, MD;  Location: [facility] INVASIVE CV LAB;  Service: Cardiovascular;  Laterality: N/A;  CFX   CORONARY STENT INTERVENTION N/A 09/07/2022   Procedure: CORONARY STENT INTERVENTION;  Surgeon: Swaziland, Peter M, MD;  Location: [facility] INVASIVE CV LAB;  Service: Cardiovascular;  Laterality: N/A;   CORONARY STENT INTERVENTION N/A 02/13/2023   Procedure: CORONARY STENT INTERVENTION;  Surgeon: Kathleene Hazel, MD;  Location: [facility] INVASIVE CV LAB;  Service: Cardiovascular;  Laterality: N/A;  LAD   CORONARY ULTRASOUND/IVUS N/A 02/13/2023   Procedure: Coronary Ultrasound/IVUS;  Surgeon: Kathleene Hazel, MD;  Location: [facility] INVASIVE CV LAB;  Service: Cardiovascular;  Laterality: N/A;   CORONARY/GRAFT ACUTE MI REVASCULARIZATION N/A 12/08/2022   Procedure: Coronary/Graft Acute MI Revascularization;  Surgeon: Marykay Lex, MD;  Location: [facility] INVASIVE CV LAB;  Service: Cardiovascular;  Laterality: N/A;   EYE SURGERY     LEFT HEART CATH AND CORONARY ANGIOGRAPHY N/A 09/29/2020   Procedure: LEFT HEART CATH AND CORONARY ANGIOGRAPHY;  Surgeon: Tonny Bollman, MD;  Location: [facility] INVASIVE CV LAB;  Service: Cardiovascular;  Laterality: N/A;   LEFT HEART CATH AND CORONARY ANGIOGRAPHY N/A 09/07/2022   Procedure: LEFT HEART CATH AND CORONARY ANGIOGRAPHY;  Surgeon: Swaziland, Peter M, MD;  Location: [facility] INVASIVE CV LAB;  Service: Cardiovascular;  Laterality: N/A;   LEFT HEART CATH AND CORONARY ANGIOGRAPHY N/A 12/08/2022   Procedure: LEFT HEART CATH AND CORONARY ANGIOGRAPHY;  Surgeon: Marykay Lex, MD;  Location: [facility] INVASIVE CV LAB;  Service:  Cardiovascular;  Laterality: N/A;   LEFT HEART CATH AND CORONARY ANGIOGRAPHY N/A 02/13/2023   Procedure: LEFT HEART CATH AND CORONARY ANGIOGRAPHY;  Surgeon: Kathleene Hazel, MD;  Location: [facility] INVASIVE CV LAB;  Service: Cardiovascular;  Laterality: N/A;   RIGHT HEART CATH N/A 04/27/2021   Procedure: RIGHT HEART CATH;  Surgeon: Dolores Patty, MD;  Location: [facility] INVASIVE CV LAB;  Service: Cardiovascular;  Laterality: N/A;   RIGHT/LEFT HEART CATH AND CORONARY ANGIOGRAPHY N/A 01/07/2017   Procedure: Right/Left Heart Cath and Coronary Angiography;  Surgeon: Laurey Morale, MD;  Location: [facility] INVASIVE CV LAB;  Service: Cardiovascular;  Laterality: N/A;   ROTATOR CUFF REPAIR     TEE WITHOUT CARDIOVERSION N/A 04/28/2021   Procedure: TRANSESOPHAGEAL ECHOCARDIOGRAM (TEE);  Surgeon: Dolores Patty, MD;  Location: [facility] ENDOSCOPY;  Service: Cardiovascular;  Laterality: N/A;   TEE WITHOUT CARDIOVERSION N/A 05/18/2021   Procedure: TRANSESOPHAGEAL ECHOCARDIOGRAM  (TEE);  Surgeon: Orbie Pyo, MD;  Location: [facility] INVASIVE CV LAB;  Service: Cardiovascular;  Laterality: N/A;   TOE AMPUTATION Left    TRANSCATHETER MITRAL EDGE TO EDGE REPAIR N/A 05/18/2021   Procedure: MITRAL VALVE REPAIR;  Surgeon: Orbie Pyo, MD;  Location: [facility] INVASIVE CV LAB;  Service: Cardiovascular;  Laterality: N/A;   US ECHOCARDIOGRAPHY  05/2017   Normal   Social History:  reports that she quit smoking about 41 years ago. Her smoking use included cigarettes. She started smoking about 42 years ago. She has been exposed to tobacco smoke. She has never used smokeless tobacco. She reports that she does not drink alcohol and does not use drugs.  Allergies  Allergen Reactions   Naproxen Hives and Rash   Shellfish-Derived Products Hives, Swelling and Other (See Comments)    Angioedema    Strawberry (Diagnostic) Hives   Celecoxib Other (See Comments)    Stomach pain   Glucosamine Hives, Swelling and Other (See Comments)    Angioedema    Iron     IV iron transfusion - hives - Feb 2022 hospitalization   Metoclopramide Other (See Comments)    Facial twitching and stuttering   Metolazone Other (See Comments)    Acute renal failure   Statins Other (See Comments)    Muscle pain    Family History  Problem Relation Age of Onset   Breast cancer Mother 12       contralateral breast ca dx 1   Coronary artery disease Mother    Hypertension Mother    Hyperlipidemia Mother    Diabetes type II Mother    Lung cancer Mother 22   Diabetes Father    Hypertension Father    Mental illness Sister    Bipolar disorder Other    Depression Other    Schizophrenia Other    Cancer Other        MGF's sisters x2; unknown type    Prior to Admission medications   Medication Sig Start Date End Date Taking? Authorizing Provider  allopurinol (ZYLOPRIM) 100 MG tablet Take 0.5 tablets (50 mg total) by mouth daily. 04/29/23   Blane Ohara, MD  aspirin EC 81 MG tablet Take 1 tablet (81 mg  total) by mouth daily. Swallow whole. 04/29/23   Cox, Fritzi Mandes, MD  busPIRone (BUSPAR) 30 MG tablet Take 1 tablet (30 mg total) by mouth 2 (two) times daily. 07/12/23   Cox, Fritzi Mandes, MD  carvedilol (COREG) 12.5 MG tablet Take 1 tablet (12.5 mg total) by mouth 2 (two) times daily. 04/29/23  Cox, Kirsten, MD  Continuous Blood Gluc Sensor (FREESTYLE LIBRE 2 SENSOR) MISC USE TO check blood glucose AS DIRECTED AND CHANGE sensor every 14 DAYS 01/11/22   [provider]  CRANBERRY PO Take 2 tablets by mouth daily.    [provider]  EPINEPHrine 0.3 mg/0.3 mL IJ SOAJ injection Use as directed for life-threatening allergic reaction. 04/29/23   Cox, Kirsten, MD  Evolocumab (REPATHA SURECLICK) 140 MG/ML SOAJ Inject 140 mg into the skin every 14 (fourteen) days. 04/29/23   Cox, Fritzi Mandes, MD  ezetimibe (ZETIA) 10 MG tablet Take 1 tablet (10 mg total) by mouth every evening. 04/29/23   CoxFritzi Mandes, MD  gabapentin (NEURONTIN) 100 MG capsule Take 1 capsule (100 mg total) by mouth 2 (two) times daily. 05/20/23 08/18/23  Sirivol, Kristie Cowman, MD  Glucagon (GVOKE HYPOPEN 2-PACK) 0.5 MG/0.1ML SOAJ Inject 0.5 mg into the skin daily as needed. 07/12/23   Cox, Fritzi Mandes, MD  HYDROcodone-acetaminophen (NORCO) 7.5-325 MG tablet Take 1 tablet by mouth 3 (three) times daily as needed. 06/17/23   Cox, Fritzi Mandes, MD  insulin aspart (NOVOLOG FLEXPEN) 100 UNIT/ML FlexPen Inject 2 - 10 Units before meals based on SSI. 04/29/23   Cox, Kirsten, MD  insulin glargine (LANTUS SOLOSTAR) 100 UNIT/ML Solostar Pen Inject 60 Units into the skin daily. Patient taking differently: Inject 40 Units into the skin daily. 05/01/23     Insulin Pen Needle (BD PEN NEEDLE NANO 2ND GEN) 32G X 4 MM MISC Inject 1 each into the skin in the morning, at noon, in the evening, and at bedtime. 08/30/21   Cox, Fritzi Mandes, MD  latanoprost (XALATAN) 0.005 % ophthalmic solution Place 1 drop into both eyes at bedtime.    [provider]  linaclotide  Karlene Einstein) 145 MCG CAPS capsule Take 1 capsule (145 mcg total) by mouth daily before breakfast. 04/29/23   Cox, Kirsten, MD  LORazepam (ATIVAN) 0.5 MG tablet Take 1 tablet (0.5 mg total) by mouth daily as needed for anxiety. 04/29/23   CoxFritzi Mandes, MD  magnesium oxide (MAG-OX) 400 MG tablet Take 2 tablets (800 mg total) by mouth daily. Take 2 (400 mg) daily=800 mg 04/29/23   Cox, Fritzi Mandes, MD  nitroGLYCERIN (NITROSTAT) 0.4 MG SL tablet DISSOLVE ONE TABLET UNDER THE TONGUE EVERY 5 MINUTES AS NEEDED FOR CHEST PAIN.  DO NOT EXCEED A TOTAL OF 3 DOSES IN 15 MINUTES 04/29/23   Cox, Kirsten, MD  Omega-3 Fatty Acids (FISH OIL PO) Take 1 capsule by mouth daily.    [provider]  OXYGEN Inhale 2 L into the lungs at bedtime.    [provider]  pantoprazole (PROTONIX) 40 MG tablet Take 1 tablet (40 mg total) by mouth daily. 04/29/23   Cox, Fritzi Mandes, MD  potassium chloride SA (KLOR-CON M) 20 MEQ tablet Take 2 tablets (40 mEq total) by mouth daily. 04/29/23   CoxFritzi Mandes, MD  Probiotic CAPS Take 1 capsule by mouth daily.    [provider]  promethazine (PHENERGAN) 25 MG tablet Take 1 tablet (25 mg total) by mouth daily as needed. 04/29/23   CoxFritzi Mandes, MD  ticagrelor (BRILINTA) 90 MG TABS tablet Take 1 tablet (90 mg total) by mouth 2 (two) times daily. 04/29/23   Cox, Fritzi Mandes, MD  torsemide (DEMADEX) 20 MG tablet Take 3 tablets (60 mg total) by mouth daily. 04/29/23   Blane Ohara, MD    Physical Exam: Vitals:   07/13/23 1700 07/13/23 1820 07/13/23 1828 07/13/23 1900  BP: 139/63 (!) 128/59  Marland Kitchen)  130/59  Pulse: 81 71  70  Resp: 18 16  19   Temp:   98.2 F (36.8 C)   TempSrc:   Oral   SpO2: 93% 100%  97%   General: Pleasant, well-appearing obese woman laying in bed. No acute distress. HEENT: [place]/AT. Anicteric sclera CV: RRR. Soft systolic murmur. Trace BLE edema Pulmonary: Lungs CTAB. Normal effort. No wheezing or rales. Abdominal: Soft, nontender, nondistended. Normal bowel  sounds. Extremities: Palpable radial and DP pulses. Normal ROM. Skin: Warm and dry. No obvious rash or lesions. Neuro: A&Ox3. Moves all extremities. Normal sensation to light touch. No focal deficit. Psych: Normal mood and affect  Data Reviewed: Labs show CBG 269, sodium 134, K+ 4.3, blood glucose 328, BUN/creatinine 67/1.83 (from 70/2.03 yesterday), troponin 21->31, BNP 212, VBG with pH 7.36, pCO2 44.7, pO2 29, bicarb 25.6, BHB 0.30, UA shows mild glucosuria and proteinuria, negative nitrite, moderate leuks, WBC 21-50 and many bacteria. EKG with sinus rhythm and no acute ischemic changes CXR stable cardiomegaly and pulmonary vascular congestion but no active infiltrate CTA chest/abdomen/pelvis dissection study shows no evidence of thoracoabdominal aortic aneurysm, aortic dissection or pulmonary embolism but shows some interstitial edema.  Assessment and Plan: Dana [name] is a 62 y.o. female with medical history significant of anxiety, chronic back pain, CKD 3B, OSA on CPAP, COPD, diastolic HF, HTN, HLD, gout, GERD, T1DM, and multivessel CAD/NSTEMI s/p multiple PCI's and complications including contrast-induced nephropathy who presents with chest pain and admitted for angina.  # Angina Patient with significant cardiac history including multivessel CAD and NSTEMI status post multiple PCI's presented with typical chest pain improved with nitro. She is hemodynamically stable. Trops are flat, EKG without acute ischemic changes. Cardiology consulted for evaluation and does not recommend initiating IV heparin. -Admitted to telemetry bed -Cardiology following, plan for left heart cath on Monday -Continue nitro drip -Continue Brilinta, aspirin and Coreg -Hold torsemide today -IV hydration prior to cath  # UTI Patient reports recent onset of foul-smelling urine and cloudy urine but no dysuria or hematuria. UA on admission shows signs of infection. Status post IV Rocephin in the ED. -Continue IV  Rocephin -Follow-up urine culture  # T1DM Last A1c was  9.0% 2 months ago. Reports that she was on 60 units of Lantus however due to intermittent hypoglycemic episodes, she decrease it to 40 units with her sliding scale NovoLog.  CBGs elevated to 200s to 300s. -Semglee 40 units at bedtime -SSI with meals, CBG monitoring -Carb modified diet  # CKD 3B  Kidney function actually improved today from 2 to 3 months ago. -Hold torsemide tonight -Gentle IV hydration tomorrow prior to cath -Avoid nephrotoxic agents  # HFpEF # Mitral regurg s/p mitral clip Patient euvolemic on exam but has trace BLE edema and currently wearing her compression socks. -Hold torsemide for today -Continue Coreg -Continue compression stockings  # HLD -Continue Zetia and Repatha   # GAD  -Continue Buspar and as needed Ativan     # Chronic back pain  -Continue home Norco and gabapentin   # OSA -Continue CPAP at bedtime -Continue supplemental O2 at bedtime  Of note, patient still pending med rec by pharmacy tech. Please resume the rest of her other meds once meds have been verified.    Advance Care Planning:   Code Status: Full Code   Consults: Cardiology  Family Communication: Discussed admission with spouse at bedside  Severity of Illness: The appropriate patient status for this patient is INPATIENT. Inpatient status  is judged to be reasonable and necessary in order to provide the required intensity of service to ensure the patient's safety. The patient's presenting symptoms, physical exam findings, and initial radiographic and laboratory data in the context of their chronic comorbidities is felt to place them at high risk for further clinical deterioration. Furthermore, it is not anticipated that the patient will be medically stable for discharge from the hospital within 2 midnights of admission.   * I certify that at the point of admission it is my clinical judgment that the patient will require  inpatient hospital care spanning beyond 2 midnights from the point of admission due to high intensity of service, high risk for further deterioration and high frequency of surveillance required.*  Author: Steffanie Rainwater, MD 07/13/2023 7:34 PM  For on call review [email or web address].

## 2023-07-13 NOTE — Assessment & Plan Note (Signed)
Management by cardiology.  

## 2023-07-13 NOTE — Assessment & Plan Note (Addendum)
Control: good. Follow up with endocrinology.  Recommend continue cgm Recommend check feet daily. Recommend annual eye exams. Medicines: Patient has self-adjusted Lantus dose from 60 units to 40 units due to hypoglycemia. Also on Novolog sliding scale before meals. -I would recommend decrease Lantus to 34 units daily and then gradually increase by 2 U if sugars are too high. -Continue Novolog sliding scale before meals. -Check blood glucose levels regularly and report any further episodes of hypoglycemia. Rx for GVOKE for severe hypoglycemia. Continue to work on eating a healthy diet and exercise.

## 2023-07-13 NOTE — Consult Note (Signed)
CARDIOLOGY CONSULT NOTE       Patient ID: Dana [name] MRN: [id number] DOB/AGE: 1960-12-27 62 y.o.  Admit date: 07/13/2023 Referring Physician: Hedwig Morton Primary Physician: Blane Ohara, MD Primary Cardiologist: Bensimohn Reason for Consultation: Angina  Active Problems:   * No active hospital problems. *   HPI:  62 y.o. seen in ED for recurrent chest pain. Complex past history of CAD. Has had stents to all 3 major coronary arteries. Last cath/intervention done 02/13/23 for ISR of LAD with scoring balloon PCI. At that time had occluded stent to distal OM, patent stent to distal RCA She had 80% ostial RCA dx with catheter damping. Dr Erich Montane initial cath note mentions staged intervention to both LAD and RCA but the latter never done. ? Because she had significant elevation in her Cr post diagnostic cath and LAD intervention   She is also s/p mitral clip with XTW/NTW between A2/P2 mild residual medial MR by TTE 12/09/22.  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 She awoke this am with SSCP partially relieved with 3 nitroglycerin and 4 baby asa.  Pain free now in ER with iv nitroglycerin. ECG is non acute and troponin not elevated 21-> 31.  She has been compliant with her meds No dyspnea, CHF, palpitations or syncope   ROS All other systems reviewed and negative except as noted above  Past Medical History:  Diagnosis Date   Acquired absence of left great toe (HCC)    Acquired absence of other left toe(s) (HCC)    ACS (acute coronary syndrome) (HCC)    Anemia    Anoxic brain injury (HCC)    Anxiety    CAD (coronary artery disease)    DES mLAD 04/07/15; DES dRCA & DES pPDA 08/30/16; DES mCX & PTCA OM1 09/29/20   Chronic diastolic (congestive) heart failure (HCC)    Chronic pain    Chronic systolic (congestive) heart failure (HCC)    CKD (chronic kidney disease)    Contrast dye induced nephropathy,  possible 09/03/2020   COPD (chronic obstructive pulmonary disease) (HCC)    COVID-19 virus infection 03/03/2023   Diabetes (HCC)type 1    Diastolic CHF (HCC)    Dyspnea    Family history of breast cancer 10/23/2021   Gastro-esophageal reflux disease without esophagitis    Hyperlipidemia    Hypertension    Hypertensive heart disease with heart failure (HCC)    Hypoxemia    Idiopathic gout, multiple sites    Leukocytosis 03/29/2022   Localized edema    Low back pain    Mitral regurgitation    Mitral regurgitation    Mixed hyperlipidemia    Mixed incontinence    Morbid (severe) obesity due to excess calories (HCC)    Neuromuscular disorder (HCC)    Neuropathy    Non-ST elevation (NSTEMI) myocardial infarction (HCC)    08/29/20, 09/29/20   OSA (obstructive sleep apnea) 09/03/2020   Other specified hypothyroidism    PAF (paroxysmal atrial fibrillation) (HCC)    s/p pulmonary vein isolation by cryoablation 10/04/15 Dr. Rudolpho Sevin in [facility]   PEA (Pulseless electrical activity) [facility])    ~ 01/13/21 at [facility] during admission DKA, volume overload, possible CAP, hypoxia s/p ACLS with ROSC ~ 10-15   Pneumonia    Primary insomnia    Rheumatoid arthritis (HCC)    Stroke [facility])    Thyroid disease  Type 1 diabetes mellitus (HCC)    Vitamin D deficiency     Family History  Problem Relation Age of Onset   Breast cancer Mother 19       contralateral breast ca dx 44   Coronary artery disease Mother    Hypertension Mother    Hyperlipidemia Mother    Diabetes type II Mother    Lung cancer Mother 65   Diabetes Father    Hypertension Father    Mental illness Sister    Bipolar disorder Other    Depression Other    Schizophrenia Other    Cancer Other        MGF's sisters x2; unknown type    Social History   Socioeconomic History   Marital status: Married    Spouse name: [name]   Number of children: 2   Years of education: 16   Highest education level: Master's degree (e.g.,  MA, MS, MEng, MEd, MSW, MBA)  Occupational History   Occupation: on disability/retired early former Engineer, site  Tobacco Use   Smoking status: Former    Current packs/day: 0.00    Types: Cigarettes    Start date: 18    Quit date: 1984    Years since quitting: 41.0    Passive exposure: Past   Smokeless tobacco: Never  Vaping Use   Vaping status: Never Used  Substance and Sexual Activity   Alcohol use: Never   Drug use: Never   Sexual activity: Not on file  Other Topics Concern   Not on file  Social History Narrative   Not on file   Social Drivers of Health   Financial Resource Strain: Low Risk  (07/01/2023)   Overall Financial Resource Strain (CARDIA)    Difficulty of Paying Living Expenses: Not hard at all  Food Insecurity: No Food Insecurity (02/13/2023)   Hunger Vital Sign    Worried About Running Out of Food in the Last Year: Never true    Ran Out of Food in the Last Year: Never true  Transportation Needs: No Transportation Needs (02/13/2023)   PRAPARE - Administrator, Civil Service (Medical): No    Lack of Transportation (Non-Medical): No  Physical Activity: Insufficiently Active (07/01/2023)   Exercise Vital Sign    Days of Exercise per Week: 5 days    Minutes of Exercise per Session: 20 min  Stress: Stress Concern Present (07/01/2023)   [employer] of Occupational Health - Occupational Stress Questionnaire    Feeling of Stress : To some extent  Social Connections: Moderately Integrated (07/25/2022)   Social Connection and Isolation Panel [NHANES]    Frequency of Communication with Friends and Family: More than three times a week    Frequency of Social Gatherings with Friends and Family: More than three times a week    Attends Religious Services: More than 4 times per year    Active Member of [employer] or Organizations: No    Attends Banker Meetings: Never    Marital Status: Married  Catering manager Violence: Not At Risk  (02/13/2023)   Humiliation, Afraid, Rape, and Kick questionnaire    Fear of Current or Ex-Partner: No    Emotionally Abused: No    Physically Abused: No    Sexually Abused: No    Past Surgical History:  Procedure Laterality Date   ABDOMINAL HYSTERECTOMY     Partial- Still has both ovaries   CARDIOVASCULAR STRESS TEST  08/13/2014   Nuclear; Normal   CARPAL  TUNNEL RELEASE     CATARACT EXTRACTION, BILATERAL     CESAREAN SECTION     CHOLECYSTECTOMY     CORONARY ANGIOPLASTY WITH STENT PLACEMENT     3 blockages/ 1 stent   CORONARY PRESSURE/FFR STUDY N/A 09/07/2022   Procedure: INTRAVASCULAR PRESSURE WIRE/FFR STUDY;  Surgeon: Swaziland, Staci Dack M, MD;  Location: [facility] INVASIVE CV LAB;  Service: Cardiovascular;  Laterality: N/A;   CORONARY STENT INTERVENTION N/A 09/29/2020   Procedure: CORONARY STENT INTERVENTION;  Surgeon: Tonny Bollman, MD;  Location: [facility] INVASIVE CV LAB;  Service: Cardiovascular;  Laterality: N/A;  CFX   CORONARY STENT INTERVENTION N/A 09/07/2022   Procedure: CORONARY STENT INTERVENTION;  Surgeon: Swaziland, Ayrabella Labombard M, MD;  Location: [facility] INVASIVE CV LAB;  Service: Cardiovascular;  Laterality: N/A;   CORONARY STENT INTERVENTION N/A 02/13/2023   Procedure: CORONARY STENT INTERVENTION;  Surgeon: Kathleene Hazel, MD;  Location: [facility] INVASIVE CV LAB;  Service: Cardiovascular;  Laterality: N/A;  LAD   CORONARY ULTRASOUND/IVUS N/A 02/13/2023   Procedure: Coronary Ultrasound/IVUS;  Surgeon: Kathleene Hazel, MD;  Location: [facility] INVASIVE CV LAB;  Service: Cardiovascular;  Laterality: N/A;   CORONARY/GRAFT ACUTE MI REVASCULARIZATION N/A 12/08/2022   Procedure: Coronary/Graft Acute MI Revascularization;  Surgeon: Marykay Lex, MD;  Location: [facility] INVASIVE CV LAB;  Service: Cardiovascular;  Laterality: N/A;   EYE SURGERY     LEFT HEART CATH AND CORONARY ANGIOGRAPHY N/A 09/29/2020   Procedure: LEFT HEART CATH AND CORONARY ANGIOGRAPHY;  Surgeon: Tonny Bollman, MD;  Location: [facility] INVASIVE CV  LAB;  Service: Cardiovascular;  Laterality: N/A;   LEFT HEART CATH AND CORONARY ANGIOGRAPHY N/A 09/07/2022   Procedure: LEFT HEART CATH AND CORONARY ANGIOGRAPHY;  Surgeon: Swaziland, Maks Cavallero M, MD;  Location: [facility] INVASIVE CV LAB;  Service: Cardiovascular;  Laterality: N/A;   LEFT HEART CATH AND CORONARY ANGIOGRAPHY N/A 12/08/2022   Procedure: LEFT HEART CATH AND CORONARY ANGIOGRAPHY;  Surgeon: Marykay Lex, MD;  Location: [facility] INVASIVE CV LAB;  Service: Cardiovascular;  Laterality: N/A;   LEFT HEART CATH AND CORONARY ANGIOGRAPHY N/A 02/13/2023   Procedure: LEFT HEART CATH AND CORONARY ANGIOGRAPHY;  Surgeon: Kathleene Hazel, MD;  Location: [facility] INVASIVE CV LAB;  Service: Cardiovascular;  Laterality: N/A;   RIGHT HEART CATH N/A 04/27/2021   Procedure: RIGHT HEART CATH;  Surgeon: Dolores Patty, MD;  Location: [facility] INVASIVE CV LAB;  Service: Cardiovascular;  Laterality: N/A;   RIGHT/LEFT HEART CATH AND CORONARY ANGIOGRAPHY N/A 01/07/2017   Procedure: Right/Left Heart Cath and Coronary Angiography;  Surgeon: Laurey Morale, MD;  Location: [facility] INVASIVE CV LAB;  Service: Cardiovascular;  Laterality: N/A;   ROTATOR CUFF REPAIR     TEE WITHOUT CARDIOVERSION N/A 04/28/2021   Procedure: TRANSESOPHAGEAL ECHOCARDIOGRAM (TEE);  Surgeon: Dolores Patty, MD;  Location: [facility] ENDOSCOPY;  Service: Cardiovascular;  Laterality: N/A;   TEE WITHOUT CARDIOVERSION N/A 05/18/2021   Procedure: TRANSESOPHAGEAL ECHOCARDIOGRAM (TEE);  Surgeon: Orbie Pyo, MD;  Location: [facility] INVASIVE CV LAB;  Service: Cardiovascular;  Laterality: N/A;   TOE AMPUTATION Left    TRANSCATHETER MITRAL EDGE TO EDGE REPAIR N/A 05/18/2021   Procedure: MITRAL VALVE REPAIR;  Surgeon: Orbie Pyo, MD;  Location: [facility] INVASIVE CV LAB;  Service: Cardiovascular;  Laterality: N/A;   US ECHOCARDIOGRAPHY  05/2017   Normal      Current Facility-Administered Medications:    nitroGLYCERIN 50 mg in dextrose 5 % 250 mL (0.2 mg/mL) infusion, 0-200  mcg/min, Intravenous, Continuous, Ernie Avena, MD, Last Rate: 4.5 mL/hr  at 07/13/23 1618, 15 mcg/min at 07/13/23 1618  Current Outpatient Medications:    allopurinol (ZYLOPRIM) 100 MG tablet, Take 0.5 tablets (50 mg total) by mouth daily., Disp: 90 tablet, Rfl: 1   aspirin EC 81 MG tablet, Take 1 tablet (81 mg total) by mouth daily. Swallow whole., Disp: 30 tablet, Rfl: 12   busPIRone (BUSPAR) 30 MG tablet, Take 1 tablet (30 mg total) by mouth 2 (two) times daily., Disp: 60 tablet, Rfl: 5   carvedilol (COREG) 12.5 MG tablet, Take 1 tablet (12.5 mg total) by mouth 2 (two) times daily., Disp: 180 tablet, Rfl: 1   Continuous Blood Gluc Sensor (FREESTYLE LIBRE 2 SENSOR) MISC, USE TO check blood glucose AS DIRECTED AND CHANGE sensor every 14 DAYS, Disp: , Rfl:    CRANBERRY PO, Take 2 tablets by mouth daily., Disp: , Rfl:    EPINEPHrine 0.3 mg/0.3 mL IJ SOAJ injection, Use as directed for life-threatening allergic reaction., Disp: 4 each, Rfl: 3   Evolocumab (REPATHA SURECLICK) 140 MG/ML SOAJ, Inject 140 mg into the skin every 14 (fourteen) days., Disp: 6 mL, Rfl: 1   ezetimibe (ZETIA) 10 MG tablet, Take 1 tablet (10 mg total) by mouth every evening., Disp: 90 tablet, Rfl: 1   gabapentin (NEURONTIN) 100 MG capsule, Take 1 capsule (100 mg total) by mouth 2 (two) times daily., Disp: 180 capsule, Rfl: 0   Glucagon (GVOKE HYPOPEN 2-PACK) 0.5 MG/0.1ML SOAJ, Inject 0.5 mg into the skin daily as needed., Disp: 0.2 mL, Rfl: 5   HYDROcodone-acetaminophen (NORCO) 7.5-325 MG tablet, Take 1 tablet by mouth 3 (three) times daily as needed., Disp: 90 tablet, Rfl: 0   insulin aspart (NOVOLOG FLEXPEN) 100 UNIT/ML FlexPen, Inject 2 - 10 Units before meals based on SSI., Disp: 15 mL, Rfl: 3   insulin glargine (LANTUS SOLOSTAR) 100 UNIT/ML Solostar Pen, Inject 60 Units into the skin daily. (Patient taking differently: Inject 40 Units into the skin daily.), Disp: 30 mL, Rfl: 5   Insulin Pen Needle (BD PEN NEEDLE NANO 2ND  GEN) 32G X 4 MM MISC, Inject 1 each into the skin in the morning, at noon, in the evening, and at bedtime., Disp: 600 each, Rfl: 3   latanoprost (XALATAN) 0.005 % ophthalmic solution, Place 1 drop into both eyes at bedtime., Disp: , Rfl:    linaclotide (LINZESS) 145 MCG CAPS capsule, Take 1 capsule (145 mcg total) by mouth daily before breakfast., Disp: 90 capsule, Rfl: 1   LORazepam (ATIVAN) 0.5 MG tablet, Take 1 tablet (0.5 mg total) by mouth daily as needed for anxiety., Disp: 90 tablet, Rfl: 1   magnesium oxide (MAG-OX) 400 MG tablet, Take 2 tablets (800 mg total) by mouth daily. Take 2 (400 mg) daily=800 mg, Disp: 180 tablet, Rfl: 1   nitroGLYCERIN (NITROSTAT) 0.4 MG SL tablet, DISSOLVE ONE TABLET UNDER THE TONGUE EVERY 5 MINUTES AS NEEDED FOR CHEST PAIN.  DO NOT EXCEED A TOTAL OF 3 DOSES IN 15 MINUTES, Disp: 50 tablet, Rfl: 3   Omega-3 Fatty Acids (FISH OIL PO), Take 1 capsule by mouth daily., Disp: , Rfl:    OXYGEN, Inhale 2 L into the lungs at bedtime., Disp: , Rfl:    pantoprazole (PROTONIX) 40 MG tablet, Take 1 tablet (40 mg total) by mouth daily., Disp: 90 tablet, Rfl: 1   potassium chloride SA (KLOR-CON M) 20 MEQ tablet, Take 2 tablets (40 mEq total) by mouth daily., Disp: 180 tablet, Rfl: 1   Probiotic CAPS, Take 1 capsule by  mouth daily., Disp: , Rfl:    promethazine (PHENERGAN) 25 MG tablet, Take 1 tablet (25 mg total) by mouth daily as needed., Disp: 90 tablet, Rfl: 1   ticagrelor (BRILINTA) 90 MG TABS tablet, Take 1 tablet (90 mg total) by mouth 2 (two) times daily., Disp: 180 tablet, Rfl: 1   torsemide (DEMADEX) 20 MG tablet, Take 3 tablets (60 mg total) by mouth daily., Disp: 270 tablet, Rfl: 1   nitroGLYCERIN 15 mcg/min (07/13/23 1618)    Physical Exam: Blood pressure (!) 145/63, pulse 83, temperature 97.9 F (36.6 C), temperature source Oral, resp. rate 12, SpO2 93%.   Overweight white female Lungs clear Apical MR murmur  Abdomen benign Trace edema Left great toa and 4  th toe amputation  Labs:   Lab Results  Component Value Date   WBC 14.6 (H) 07/13/2023   HGB 11.2 (L) 07/13/2023   HCT 33.0 (L) 07/13/2023   MCV 87.7 07/13/2023   PLT 250 07/13/2023    Recent Labs  Lab 07/12/23 1105 07/12/23 1105 07/13/23 1325 07/13/23 1430  NA 139   < > 134* 138  K 4.4  --  4.3 4.2  CL 100  --  101  --   CO2 22  --  22  --   BUN 70*  --  67*  --   CREATININE 2.03*  --  1.83*  --   CALCIUM 9.2  --  8.7*  --   PROT 7.3  --   --   --   BILITOT 0.5  --   --   --   ALKPHOS 226*  --   --   --   ALT 25  --   --   --   AST 21  --   --   --   GLUCOSE 162*  --  328*  --    < > = values in this interval not displayed.   Lab Results  Component Value Date   TROPONINI 0.03 (HH) 11/07/2017    Lab Results  Component Value Date   CHOL 92 (L) 07/12/2023   CHOL 131 04/04/2023   CHOL 128 12/19/2022   Lab Results  Component Value Date   HDL 47 07/12/2023   HDL 42 04/04/2023   HDL 41 12/19/2022   Lab Results  Component Value Date   LDLCALC 28 07/12/2023   LDLCALC 68 04/04/2023   LDLCALC 66 12/19/2022   Lab Results  Component Value Date   TRIG 81 07/12/2023   TRIG 114 04/04/2023   TRIG 115 12/19/2022   Lab Results  Component Value Date   CHOLHDL 2.0 07/12/2023   CHOLHDL 3.1 04/04/2023   CHOLHDL 3.1 12/19/2022   No results found for: "LDLDIRECT"    Radiology: No results found.  EKG: SR no acute ST changes    ASSESSMENT AND PLAN:    Angina:  pain similar to her angina and relief with nitroglycerin. ECG non acute and troponin negative Continue home meds including DAT. Continue iv nitroglycerin since it has made her pain free. Heparin per Dr Rennis Golden not indicated now with negative troponin. Shared decision making favor diagnostic cath on Monday. I suspect that issue is the non revascularized ostial RCA that was noted to be 80% with catheter damping. Her Cr is 1.8 Hold her demedex and K tomorrow before cath Will hydrate evening before since no CHF and  EF low normal She normally has cath from femoral arteries as radials have not been easy to negotiate  and interventions difficult  Shared Decision Making/Informed Consent The risks [stroke (1 in 1000), death (1 in 1000), kidney failure [usually temporary] (1 in 500), bleeding (1 in 200), allergic reaction [possibly serious] (1 in 200)], benefits (diagnostic support and management of coronary artery disease) and alternatives of a cardiac catheterization were discussed in detail with Ms. Wanita Chamberlain and she is willing to proceed.  2.  HLD:  continue repatha and zetia  3.  DM:  per hospitalist service SS insulin  4. Mitral Clip: Done 05/18/21 mild residual MR no need to repeat TTE this admission   Signed: Charlton Haws 07/13/2023, 4:40 PM

## 2023-07-13 NOTE — ED Provider Notes (Signed)
[place] EMERGENCY DEPARTMENT AT [facility] Provider Note   CSN: [id number] Arrival date & time: 07/13/23  1234     History  No chief complaint on file.   Dana [name] is a 62 y.o. female.  HPI   62 year old female with medical history significant for COPD, diabetes mellitus type 1, CKD, CHF follows outpatient with advanced heart failure Dr. Sampson Goon, CAD/MI with most recent heart catheterization in July 2024 showing restenosis of a stent, on aspirin and Brilinta, presenting to the Emergency Department with chest pain.  The patient states that she has been having chest pain and pressure all morning.  Dates that the pain woke her up from sleep this morning at 9:30 AM.  It radiates to the left arm.  She is endorsing associated nausea.  She took 324 mg of aspirin prior to EMS arrival and also administered 3 sublingual nitroglycerin with improvement in her symptoms.  EMS gave an additional sublingual nitroglycerin and route.  She has not taken her Lantus today and her CBG was notably 414.  She also last took her Brilinta last night.  She rates the pain as a heaviness sensation along her left chest, radiating down her left arm, some radiation to the back, not ripping or tearing with associated nausea.  Denies any weight gain or lower extremity swelling and feels like she has been doing better from that standpoint.  She is worried she is having another heart attack.  Home Medications Prior to Admission medications   Medication Sig Start Date End Date Taking? Authorizing Provider  allopurinol (ZYLOPRIM) 100 MG tablet Take 0.5 tablets (50 mg total) by mouth daily. 04/29/23   Blane Ohara, MD  aspirin EC 81 MG tablet Take 1 tablet (81 mg total) by mouth daily. Swallow whole. 04/29/23   Cox, Fritzi Mandes, MD  busPIRone (BUSPAR) 30 MG tablet Take 1 tablet (30 mg total) by mouth 2 (two) times daily. 07/12/23   Cox, Fritzi Mandes, MD  carvedilol (COREG) 12.5 MG tablet Take 1 tablet (12.5 mg total)  by mouth 2 (two) times daily. 04/29/23   CoxFritzi Mandes, MD  Continuous Blood Gluc Sensor (FREESTYLE LIBRE 2 SENSOR) MISC USE TO check blood glucose AS DIRECTED AND CHANGE sensor every 14 DAYS 01/11/22   [provider]  CRANBERRY PO Take 2 tablets by mouth daily.    [provider]  EPINEPHrine 0.3 mg/0.3 mL IJ SOAJ injection Use as directed for life-threatening allergic reaction. 04/29/23   Cox, Kirsten, MD  Evolocumab (REPATHA SURECLICK) 140 MG/ML SOAJ Inject 140 mg into the skin every 14 (fourteen) days. 04/29/23   Cox, Fritzi Mandes, MD  ezetimibe (ZETIA) 10 MG tablet Take 1 tablet (10 mg total) by mouth every evening. 04/29/23   CoxFritzi Mandes, MD  gabapentin (NEURONTIN) 100 MG capsule Take 1 capsule (100 mg total) by mouth 2 (two) times daily. 05/20/23 08/18/23  Sirivol, Kristie Cowman, MD  Glucagon (GVOKE HYPOPEN 2-PACK) 0.5 MG/0.1ML SOAJ Inject 0.5 mg into the skin daily as needed. 07/12/23   Cox, Fritzi Mandes, MD  HYDROcodone-acetaminophen (NORCO) 7.5-325 MG tablet Take 1 tablet by mouth 3 (three) times daily as needed. 06/17/23   Cox, Fritzi Mandes, MD  insulin aspart (NOVOLOG FLEXPEN) 100 UNIT/ML FlexPen Inject 2 - 10 Units before meals based on SSI. 04/29/23   Cox, Kirsten, MD  insulin glargine (LANTUS SOLOSTAR) 100 UNIT/ML Solostar Pen Inject 60 Units into the skin daily. Patient taking differently: Inject 40 Units into the skin daily. 05/01/23     Insulin Pen  Needle (BD PEN NEEDLE NANO 2ND GEN) 32G X 4 MM MISC Inject 1 each into the skin in the morning, at noon, in the evening, and at bedtime. 08/30/21   Cox, Fritzi Mandes, MD  latanoprost (XALATAN) 0.005 % ophthalmic solution Place 1 drop into both eyes at bedtime.    [provider]  linaclotide Karlene Einstein) 145 MCG CAPS capsule Take 1 capsule (145 mcg total) by mouth daily before breakfast. 04/29/23   Cox, Kirsten, MD  LORazepam (ATIVAN) 0.5 MG tablet Take 1 tablet (0.5 mg total) by mouth daily as needed for anxiety. 04/29/23   CoxFritzi Mandes, MD   magnesium oxide (MAG-OX) 400 MG tablet Take 2 tablets (800 mg total) by mouth daily. Take 2 (400 mg) daily=800 mg 04/29/23   Cox, Fritzi Mandes, MD  nitroGLYCERIN (NITROSTAT) 0.4 MG SL tablet DISSOLVE ONE TABLET UNDER THE TONGUE EVERY 5 MINUTES AS NEEDED FOR CHEST PAIN.  DO NOT EXCEED A TOTAL OF 3 DOSES IN 15 MINUTES 04/29/23   Cox, Kirsten, MD  Omega-3 Fatty Acids (FISH OIL PO) Take 1 capsule by mouth daily.    [provider]  OXYGEN Inhale 2 L into the lungs at bedtime.    [provider]  pantoprazole (PROTONIX) 40 MG tablet Take 1 tablet (40 mg total) by mouth daily. 04/29/23   Cox, Fritzi Mandes, MD  potassium chloride SA (KLOR-CON M) 20 MEQ tablet Take 2 tablets (40 mEq total) by mouth daily. 04/29/23   CoxFritzi Mandes, MD  Probiotic CAPS Take 1 capsule by mouth daily.    [provider]  promethazine (PHENERGAN) 25 MG tablet Take 1 tablet (25 mg total) by mouth daily as needed. 04/29/23   CoxFritzi Mandes, MD  ticagrelor (BRILINTA) 90 MG TABS tablet Take 1 tablet (90 mg total) by mouth 2 (two) times daily. 04/29/23   Cox, Fritzi Mandes, MD  torsemide (DEMADEX) 20 MG tablet Take 3 tablets (60 mg total) by mouth daily. 04/29/23   CoxFritzi Mandes, MD      Allergies    Naproxen, Shellfish-derived products, Strawberry (diagnostic), Celecoxib, Glucosamine, Iron, Metoclopramide, Metolazone, and Statins    Review of Systems   Review of Systems  Cardiovascular:  Positive for chest pain.  Gastrointestinal:  Positive for nausea.  All other systems reviewed and are negative.   Physical Exam Updated Vital Signs BP (!) 145/65   Pulse 84   Temp 97.9 F (36.6 C) (Oral)   Resp 15   SpO2 93%  Physical Exam Vitals and nursing note reviewed.  Constitutional:      General: She is not in acute distress.    Appearance: She is well-developed. She is not diaphoretic.     Comments: Uncomfortable appearing, not diaphoretic  HENT:     Head: Normocephalic and atraumatic.  Eyes:      Conjunctiva/sclera: Conjunctivae normal.  Cardiovascular:     Rate and Rhythm: Normal rate and regular rhythm.  Pulmonary:     Effort: Pulmonary effort is normal. No respiratory distress.     Breath sounds: Normal breath sounds.  Abdominal:     Palpations: Abdomen is soft.     Tenderness: There is no abdominal tenderness.  Musculoskeletal:        General: No swelling.     Cervical back: Neck supple.     Right lower leg: No edema.     Left lower leg: No edema.  Skin:    General: Skin is warm and dry.     Capillary Refill: Capillary refill takes less than 2  seconds.  Neurological:     Mental Status: She is alert.  Psychiatric:        Mood and Affect: Mood normal.     ED Results / Procedures / Treatments   Labs (all labs ordered are listed, but only abnormal results are displayed) Labs Reviewed  BASIC METABOLIC PANEL - Abnormal; Notable for the following components:      Result Value   Sodium 134 (*)    Glucose, Bld 328 (*)    BUN 67 (*)    Creatinine, Ser 1.83 (*)    Calcium 8.7 (*)    GFR, Estimated 31 (*)    All other components within normal limits  CBC - Abnormal; Notable for the following components:   WBC 14.6 (*)    RBC 3.66 (*)    Hemoglobin 10.1 (*)    HCT 32.1 (*)    All other components within normal limits  BETA-HYDROXYBUTYRIC ACID - Abnormal; Notable for the following components:   Beta-Hydroxybutyric Acid 0.30 (*)    All other components within normal limits  CBG MONITORING, ED - Abnormal; Notable for the following components:   Glucose-Capillary 269 (*)    All other components within normal limits  CBG MONITORING, ED - Abnormal; Notable for the following components:   Glucose-Capillary 319 (*)    All other components within normal limits  I-STAT VENOUS BLOOD GAS, ED - Abnormal; Notable for the following components:   pO2, Ven 29 (*)    Calcium, Ion 1.12 (*)    HCT 33.0 (*)    Hemoglobin 11.2 (*)    All other components within normal limits   TROPONIN I (HIGH SENSITIVITY) - Abnormal; Notable for the following components:   Troponin I (High Sensitivity) 21 (*)    All other components within normal limits  TROPONIN I (HIGH SENSITIVITY)    EKG EKG Interpretation Date/Time:  Saturday July 13 2023 12:39:37 EST Ventricular Rate:  84 PR Interval:  172 QRS Duration:  86 QT Interval:  402 QTC Calculation: 475 R Axis:   85  Text Interpretation: Normal sinus rhythm Nonspecific ST abnormality Abnormal ECG No significant change since last tracing Confirmed by Ernie Avena ([id number]) on 07/13/2023 1:48:27 PM  Radiology No results found.  Procedures Procedures    Medications Ordered in ED Medications  nitroGLYCERIN 50 mg in dextrose 5 % 250 mL (0.2 mg/mL) infusion (5 mcg/min Intravenous New Bag/Given 07/13/23 1509)  morphine (PF) 4 MG/ML injection 4 mg (4 mg Intravenous Given 07/13/23 1427)  ondansetron (ZOFRAN) injection 4 mg (4 mg Intravenous Given 07/13/23 1426)  iohexol (OMNIPAQUE) 350 MG/ML injection 75 mL (75 mLs Intravenous Contrast Given 07/13/23 1514)    ED Course/ Medical Decision Making/ A&P                                 Medical Decision Making Amount and/or Complexity of Data Reviewed Labs: ordered. Radiology: ordered.  Risk Prescription drug management.    62 year old female with medical history significant for COPD, diabetes mellitus type 1, CKD, CHF follows outpatient with advanced heart failure Dr. Milas Kocher, CAD/MI with most recent heart catheterization in July 2024 showing restenosis of a stent, on aspirin and Brilinta, presenting to the Emergency Department with chest pain.  The patient states that she has been having chest pain and pressure all morning.  Dates that the pain woke her up from sleep this morning at 9:30 AM.  It radiates to  the left arm.  She is endorsing associated nausea.  She took 324 mg of aspirin prior to EMS arrival and also administered 3 sublingual nitroglycerin with improvement  in her symptoms.  EMS gave an additional sublingual nitroglycerin and route.  She has not taken her Lantus today and her CBG was notably 414.  She also last took her Brilinta last night.  She rates the pain as a heaviness sensation along her left chest, radiating down her left arm, some radiation to the back, not ripping or tearing with associated nausea.  Denies any weight gain or lower extremity swelling and feels like she has been doing better from that standpoint.  She is worried she is having another heart attack.  On arrival, the patient was afebrile, not tachycardic heart rate 86, not tachypneic, saturating well on room air, hypertensive BP 157/68.  Physical exam generally unremarkable as per above.  Concern for ACS, lower concern for CHF exacerbation.  Patient endorsing some chest discomfort that radiates to the back, considered aortic dissection.  Additionally in the setting the patient's uncontrolled hyperglycemia, considered HHS versus DKA.  Initial EKG revealed sinus rhythm, ventricular rate 84, nonspecific ST changes without evidence of STEMI.  The patient was started on a nitroglycerin gtt. given her active chest pressure that had improved with nitroglycerin earlier and given her history of ACS/CAD.  She is already status post full-strength aspirin.  She was also administered morphine for pain control in addition to Zofran for nausea.  Laboratory evaluation obtained after IV access was obtained and revealed a pH of 7.37, pCO2 45, HCO3 of 26, BMP did not show evidence of DKA with hyperglycemia to 328, bicarbonate of 22 and an anion gap of 11 with serum creatinine at baseline at 1.83 with a BUN slightly elevated at 67, initial cardiac troponin only mildly elevated at 21, beta hydroxybutyrate mildly elevated at 0.30, CBC with a nonspecific leukocytosis to 14.6, mild anemia to 10.1.  Repeat troponin was pending.  To obtain CTA dissection protocol to further evaluate given the patient's radiation of  pain to the back, once dissection study results, plan for cardiology consultation in addition to following up on the patient's cardiac troponin, plan for reassessment.  Signout given to Dr. Criss Alvine at 1500 pending results of diagnostic testing and reassessment.   Final Clinical Impression(s) / ED Diagnoses Final diagnoses:  Chest pain, unspecified type  Hyperglycemia    Rx / DC Orders ED Discharge Orders     None         Ernie Avena, MD 07/13/23 1533

## 2023-07-13 NOTE — Assessment & Plan Note (Signed)
Management per specialist.  Continue carvedilol 12.5 mg twice daily, torsemide 40 mg daily, Repatha 140 mg SQ every 2 weeks, Zetia 10 mg p.o. daily, aspirin 81 mg daily Brilinta 90 mg twice daily.

## 2023-07-14 ENCOUNTER — Other Ambulatory Visit: Payer: Self-pay

## 2023-07-14 DIAGNOSIS — I209 Angina pectoris, unspecified: Secondary | ICD-10-CM | POA: Diagnosis not present

## 2023-07-14 LAB — BASIC METABOLIC PANEL
Anion gap: 9 (ref 5–15)
BUN: 65 mg/dL — ABNORMAL HIGH (ref 6–20)
CO2: 22 mmol/L (ref 22–32)
Calcium: 8.5 mg/dL — ABNORMAL LOW (ref 8.9–10.3)
Chloride: 106 mmol/L (ref 98–111)
Creatinine, Ser: 2.11 mg/dL — ABNORMAL HIGH (ref 0.44–1.00)
GFR, Estimated: 26 mL/min — ABNORMAL LOW (ref >60)
Glucose, Bld: 123 mg/dL — ABNORMAL HIGH (ref 70–99)
Potassium: 3.9 mmol/L (ref 3.5–5.1)
Sodium: 137 mmol/L (ref 135–145)

## 2023-07-14 LAB — CBG MONITORING, ED
Glucose-Capillary: 112 mg/dL — ABNORMAL HIGH (ref 70–99)
Glucose-Capillary: 145 mg/dL — ABNORMAL HIGH (ref 70–99)

## 2023-07-14 LAB — CBC
HCT: 29.6 % — ABNORMAL LOW (ref 36.0–46.0)
Hemoglobin: 9.3 g/dL — ABNORMAL LOW (ref 12.0–15.0)
MCH: 27.8 pg (ref 26.0–34.0)
MCHC: 31.4 g/dL (ref 30.0–36.0)
MCV: 88.6 fL (ref 80.0–100.0)
Platelets: 239 10*3/uL (ref 150–400)
RBC: 3.34 MIL/uL — ABNORMAL LOW (ref 3.87–5.11)
RDW: 14.4 % (ref 11.5–15.5)
WBC: 10 10*3/uL (ref 4.0–10.5)
nRBC: 0 % (ref 0.0–0.2)

## 2023-07-14 LAB — GLUCOSE, CAPILLARY
Glucose-Capillary: 119 mg/dL — ABNORMAL HIGH (ref 70–99)
Glucose-Capillary: 137 mg/dL — ABNORMAL HIGH (ref 70–99)

## 2023-07-14 MED ORDER — SODIUM CHLORIDE 0.9 % WEIGHT BASED INFUSION
1.0000 mL/kg/h | INTRAVENOUS | Status: AC
  Administered 2023-07-14 – 2023-07-15 (×4): 1 mL/kg/h via INTRAVENOUS

## 2023-07-14 MED ORDER — OMEGA-3-ACID ETHYL ESTERS 1 G PO CAPS
1.0000 g | ORAL_CAPSULE | Freq: Every day | ORAL | Status: DC
  Administered 2023-07-14 – 2023-07-20 (×7): 1 g via ORAL
  Filled 2023-07-14 (×7): qty 1

## 2023-07-14 MED ORDER — BUSPIRONE HCL 5 MG PO TABS
10.0000 mg | ORAL_TABLET | Freq: Three times a day (TID) | ORAL | Status: DC
  Administered 2023-07-14 – 2023-07-20 (×17): 10 mg via ORAL
  Filled 2023-07-14 (×2): qty 1
  Filled 2023-07-14: qty 2
  Filled 2023-07-14 (×3): qty 1
  Filled 2023-07-14: qty 2
  Filled 2023-07-14: qty 1
  Filled 2023-07-14: qty 2
  Filled 2023-07-14 (×2): qty 1
  Filled 2023-07-14: qty 2
  Filled 2023-07-14: qty 1
  Filled 2023-07-14: qty 2
  Filled 2023-07-14 (×3): qty 1

## 2023-07-14 MED ORDER — LATANOPROST 0.005 % OP SOLN
1.0000 [drp] | Freq: Every day | OPHTHALMIC | Status: DC
  Administered 2023-07-14 – 2023-07-19 (×6): 1 [drp] via OPHTHALMIC
  Filled 2023-07-14 (×2): qty 2.5

## 2023-07-14 MED ORDER — HYDROXYZINE HCL 10 MG PO TABS
10.0000 mg | ORAL_TABLET | Freq: Three times a day (TID) | ORAL | Status: DC | PRN
  Administered 2023-07-14 – 2023-07-20 (×7): 10 mg via ORAL
  Filled 2023-07-14 (×9): qty 1

## 2023-07-14 MED ORDER — ALLOPURINOL 100 MG PO TABS
50.0000 mg | ORAL_TABLET | Freq: Every day | ORAL | Status: DC
  Administered 2023-07-14 – 2023-07-20 (×7): 50 mg via ORAL
  Filled 2023-07-14 (×7): qty 1

## 2023-07-14 MED ORDER — HYDROXYZINE HCL 10 MG PO TABS: 10.0000 mg | ORAL_TABLET | Freq: Three times a day (TID) | ORAL | Status: DC | PRN

## 2023-07-14 MED ORDER — MAGNESIUM OXIDE -MG SUPPLEMENT 400 (240 MG) MG PO TABS
800.0000 mg | ORAL_TABLET | Freq: Every day | ORAL | Status: DC
Start: 2023-07-14 — End: 2023-07-20
  Administered 2023-07-14 – 2023-07-20 (×7): 800 mg via ORAL
  Filled 2023-07-14 (×10): qty 2

## 2023-07-14 MED ORDER — LINACLOTIDE 145 MCG PO CAPS: 145.0000 ug | ORAL_CAPSULE | Freq: Every day | ORAL | Status: DC | PRN

## 2023-07-14 MED ORDER — ASPIRIN 81 MG PO CHEW
81.0000 mg | CHEWABLE_TABLET | ORAL | Status: AC
  Administered 2023-07-15: 81 mg via ORAL
  Filled 2023-07-14: qty 1

## 2023-07-14 NOTE — Plan of Care (Signed)
°  Problem: Coping: Goal: Ability to adjust to condition or change in health will improve Outcome: Progressing   Problem: Activity: Goal: Ability to return to baseline activity level will improve Outcome: Progressing

## 2023-07-14 NOTE — Progress Notes (Signed)
Patient arrived to room 3E17. VS stable, patient free from pain. NTG drip running at 23mcg/min. Patient oriented to room and call bell in reach.

## 2023-07-14 NOTE — ED Notes (Signed)
Pt. tearful and reporting anxiety d/t IV pump beeping triggering past medical trauma.

## 2023-07-14 NOTE — Progress Notes (Signed)
PROGRESS NOTE Dana [name]  JWJ:[id number] DOB: [birth year] DOA: 07/13/2023 PCP: Blane Ohara, MD  Brief Narrative/Hospital Course:  anxiety, chronic back pain, CKD 3B, OSA on CPAP, COPD, diastolic HF, HTN, HLD, gout, GERD, T1DM, and multivessel CAD/NSTEMI s/p multiple PCI's and complications including contrast-induced nephropathy who presents with chest pain  In ED, vitals stable afebrile, labs with creatinine 2.0 BUN 70 hemoglobin 9.9 g, troponin elevated at 21-31 flat, cardiology consulted and admitted for angina. EKG without ischemic changes.  Patient was placed in nitro drip with plan for cardiac cath on weekday    Subjective: Seen and examined.  Resting comfortably denies any chest pain Nausea vomiting Intermittent chest twinge lat night.   Assessment and Plan: Principal Problem:   Angina pectoris (HCC) Active Problems:   Hyperglycemia  Angina CAD with multivessel disease, prior history of NSTEMI status post multiple PCI : Her chest pain was similar to her previous angina with relief from nitroglycerin. Currently chest pain-free.Continue nitroglycerin drip, plan for cardiac cath on Monday will need IV hydration prior to cath.  Continue aspirin 81 Coreg 12.5, Zetia, Brilinta.   UTI POA: Continue ceftriaxone, follow-up culture.   Type 1 diabetes mellitus on insulin: A1c up to 9 2 months ago, PTA on 60 units Lantus to intermittent hypoglycemia down to 40 units and sliding scale.  Continue 40 u semglee, ssi and carb controlled diet.   CKD 3B: Holding torsemide plan for IV hydration precath avoid nephrotoxic agent   HFpEF MR s/p MitraClip: Holding torsemide monitor volume status continue her Coreg, compression stockings   HLD: Continue Zetia and Repatha   Chronic back pain  Continue home Norco and gabapentin   OSA: Continue CPAP treatment supplemental oxygen.  Obesity: Patient's Body mass index is 38.52 kg/m. : Will benefit with PCP follow-up, weight loss,healthy  lifestyle   DVT prophylaxis: enoxaparin (LOVENOX) injection 40 mg Start: 07/13/23 1900 Code Status:   Code Status: Full Code Family Communication: plan of care discussed with patient at bedside. Patient status is: Remains hospitalized because of severity of illness Level of care: Telemetry Cardiac   Dispo: The patient is from: home with family            Anticipated disposition: TBD Objective: Vitals last 24 hrs: Vitals:   07/14/23 0230 07/14/23 0249 07/14/23 0445 07/14/23 0645  BP: (!) 111/49  (!) 115/55   Pulse: 64  65   Resp: 13  14   Temp:  98 F (36.7 C)  97.9 F (36.6 C)  TempSrc:  Oral  Oral  SpO2: 94%  94%   Weight:      Height:       Weight change:   Physical Examination: General exam: alert awake,at baseline, older than stated age HEENT:Oral mucosa moist, Ear/Nose WNL grossly Respiratory system: Bilaterally clear BS,no use of accessory muscle Cardiovascular system: S1 & S2 +, No JVD. Gastrointestinal system: Abdomen soft,NT,ND, BS+ Nervous System: Alert, awake, moving all extremities,and following commands. Extremities: LE edema neg,distal peripheral pulses palpable and warm.  Skin: No rashes,no icterus. MSK: Normal muscle bulk,tone, power   Medications reviewed:  Scheduled Meds:  aspirin EC  81 mg Oral Daily   carvedilol  12.5 mg Oral BID   enoxaparin (LOVENOX) injection  40 mg Subcutaneous Q24H   ezetimibe  10 mg Oral QPM   gabapentin  100 mg Oral BID   insulin aspart  0-15 Units Subcutaneous TID WC   insulin aspart  0-5 Units Subcutaneous QHS   insulin glargine-yfgn  40  Units Subcutaneous QHS   pantoprazole  40 mg Oral Daily   ticagrelor  90 mg Oral BID   Continuous Infusions:  cefTRIAXone (ROCEPHIN)  IV     nitroGLYCERIN 10 mcg/min (07/13/23 1825)      Diet Order             Diet Carb Modified Fluid consistency: Thin  Diet effective now                  Intake/Output Summary (Last 24 hours) at 07/14/2023 0954 Last data filed at  07/13/2023 1916 Gross per 24 hour  Intake 100 ml  Output --  Net 100 ml   Net IO Since Admission: 100 mL [07/14/23 0954]  Wt Readings from Last 3 Encounters:  07/13/23 105 kg  07/12/23 105.2 kg  05/14/23 104.7 kg     Unresulted Labs (From admission, onward)     Start     Ordered   07/13/23 1645  Urine Culture  Once,   R        07/13/23 1645          Data Reviewed: I have personally reviewed following labs and imaging studies CBC: Recent Labs  Lab 07/12/23 1105 07/13/23 1325 07/13/23 1430 07/14/23 0512  WBC 11.0* 14.6*  --  10.0  NEUTROABS 8.0*  --   --   --   HGB 9.9* 10.1* 11.2* 9.3*  HCT 31.5* 32.1* 33.0* 29.6*  MCV 88 87.7  --  88.6  PLT 256 250  --  239   Basic Metabolic Panel:  Recent Labs  Lab 07/12/23 1105 07/13/23 1325 07/13/23 1430 07/14/23 0512  NA 139 134* 138 137  K 4.4 4.3 4.2 3.9  CL 100 101  --  106  CO2 22 22  --  22  GLUCOSE 162* 328*  --  123*  BUN 70* 67*  --  65*  CREATININE 2.03* 1.83*  --  2.11*  CALCIUM 9.2 8.7*  --  8.5*   GFR: Estimated Creatinine Clearance: 34.1 mL/min (A) (by C-G formula based on SCr of 2.11 mg/dL (H)). Liver Function Tests:  Recent Labs  Lab 07/12/23 1105  AST 21  ALT 25  ALKPHOS 226*  BILITOT 0.5  PROT 7.3  ALBUMIN 4.0   Recent Labs  Lab 07/13/23 1244 07/13/23 1346 07/13/23 2118 07/14/23 0805  GLUCAP 269* 319* 219* 112*   Recent Labs    07/12/23 1105  CHOL 92*  HDL 47  LDLCALC 28  TRIG 81  CHOLHDL 2.0   No results for input(s): "TSH", "T4TOTAL", "FREET4", "T3FREE", "THYROIDAB" in the last 72 hours. Sepsis Labs: No results for input(s): "PROCALCITON", "LATICACIDVEN" in the last 168 hours. No results found for this or any previous visit (from the past 240 hours).  Antimicrobials/Microbiology: Anti-infectives (From admission, onward)    Start     Dose/Rate Route Frequency Ordered Stop   07/14/23 1600  cefTRIAXone (ROCEPHIN) 1 g in sodium chloride 0.9 % 100 mL IVPB        1 g 200  mL/hr over 30 Minutes Intravenous Every 24 hours 07/13/23 2039     07/13/23 1730  cefTRIAXone (ROCEPHIN) 1 g in sodium chloride 0.9 % 100 mL IVPB        1 g 200 mL/hr over 30 Minutes Intravenous  Once 07/13/23 1726 07/13/23 1916         Component Value Date/Time   SDES BLOOD RIGHT ANTECUBITAL 10/03/2020 1141   SDES BLOOD LEFT HAND 10/03/2020 1141  SPECREQUEST  10/03/2020 1141    BOTTLES DRAWN AEROBIC AND ANAEROBIC Blood Culture results may not be optimal due to an inadequate volume of blood received in culture bottles   SPECREQUEST  10/03/2020 1141    BOTTLES DRAWN AEROBIC AND ANAEROBIC Blood Culture results may not be optimal due to an inadequate volume of blood received in culture bottles   CULT  10/03/2020 1141    NO GROWTH 5 DAYS Performed at [facility], [address] [place], [place] [address]    CULT  10/03/2020 1141    NO GROWTH 5 DAYS Performed at [facility], [address], [place], [place] [address]    REPTSTATUS 10/08/2020 FINAL 10/03/2020 1141   REPTSTATUS 10/08/2020 FINAL 10/03/2020 1141     Radiology Studies: CT Angio Chest/Abd/Pel for Dissection W and/or Wo Contrast Result Date: 07/13/2023 CLINICAL DATA:  Back and chest pain EXAM: CT ANGIOGRAPHY CHEST, ABDOMEN AND PELVIS TECHNIQUE: Non-contrast CT of the chest was initially obtained. Multidetector CT imaging through the chest, abdomen and pelvis was performed using the standard protocol during bolus administration of intravenous contrast. Multiplanar reconstructed images and MIPs were obtained and reviewed to evaluate the vascular anatomy. RADIATION DOSE REDUCTION: This exam was performed according to the departmental dose-optimization program which includes automated exposure control, adjustment of the mA and/or kV according to patient size and/or use of iterative reconstruction technique. CONTRAST:  75mL OMNIPAQUE IOHEXOL 350 MG/ML SOLN COMPARISON:  08/29/2020, 07/13/2023 FINDINGS: CTA CHEST FINDINGS  Cardiovascular: No evidence of thoracic aortic aneurysm or dissection. Borderline cardiomegaly without pericardial effusion. Post therapeutic changes from mitral valve repair. Diffuse atherosclerosis of the coronary vasculature. Chronic retained 1.3 cm metallic object within the left lower lobe pulmonary artery, stable since the 08/29/2020 exam, likely pulmonary arterial pressure monitor. No other filling defects or pulmonary emboli. Mediastinum/Nodes: Persistent borderline enlarged mediastinal and hilar lymph nodes, not significantly changed since prior exam. Index lymph node in the pretracheal region image 26/6 measures 14 mm in short axis, previously 12 mm. Thyroid, trachea, and esophagus are unremarkable. Lungs/Pleura: The diffuse interlobular septal thickening, greatest at the bases, consistent with mild interstitial edema. Scattered hypoventilatory changes at the lung bases. No airspace disease, effusion, or pneumothorax. Central airways are patent. Musculoskeletal: No acute or destructive bony abnormalities. Prior healed bilateral rib fractures. Reconstructed images demonstrate no additional findings. Review of the MIP images confirms the above findings. CTA ABDOMEN AND PELVIS FINDINGS VASCULAR Aorta: Normal caliber aorta without aneurysm, dissection, vasculitis or significant stenosis. Mild atherosclerosis. Celiac: Patent without evidence of aneurysm, dissection, vasculitis or significant stenosis. Diffuse atherosclerosis. SMA: Patent without evidence of aneurysm, dissection, vasculitis or significant stenosis. Diffuse atherosclerosis. Renals: Accessory right renal artery is noted. The bilateral renal arteries are patent without evidence of aneurysm, dissection, vasculitis, fibromuscular dysplasia or significant stenosis. IMA: Patent without evidence of aneurysm, dissection, vasculitis or significant stenosis. Inflow: Patent without evidence of aneurysm, dissection, vasculitis or significant stenosis.  Diffuse atherosclerosis. Veins: No obvious venous abnormality within the limitations of this arterial phase study. Review of the MIP images confirms the above findings. NON-VASCULAR Hepatobiliary: No focal liver abnormality is seen. Status post cholecystectomy. No biliary dilatation. Pancreas: Unremarkable. No pancreatic ductal dilatation or surrounding inflammatory changes. Spleen: Normal in size without focal abnormality. Adrenals/Urinary Tract: Adrenal glands are unremarkable. Kidneys are normal, without renal calculi, focal lesion, or hydronephrosis. There is gas within the bladder lumen, please correlate with any recent instrumentation or catheterization. Stomach/Bowel: No bowel obstruction or ileus. No bowel wall thickening  or inflammatory change. Lymphatic: No pathologic adenopathy within the abdomen or pelvis. Reproductive: Status post hysterectomy. No adnexal masses. Other: Trace free fluid within the right upper quadrant, nonspecific. No free intraperitoneal gas. No abdominal wall hernia. Musculoskeletal: No acute or destructive bony abnormalities. Reconstructed images demonstrate no additional findings. Review of the MIP images confirms the above findings. IMPRESSION: Vascular 1. No evidence of thoracoabdominal aortic aneurysm or dissection. 2. No evidence of acute pulmonary embolus. 3. Aortic Atherosclerosis (ICD10-I70.0). Coronary artery atherosclerosis. Nonvascular: 1. Diffuse interlobular septal thickening favoring interstitial edema. 2. Stable mediastinal and hilar lymphadenopathy, nonspecific. 3. Trace ascites right upper quadrant, nonspecific. 4. Gas within the bladder lumen, likely due to recent catheterization or instrumentation. Electronically Signed   By: Sharlet Salina M.D.   On: 07/13/2023 16:37   DG Chest 2 View Result Date: 07/13/2023 CLINICAL DATA:  Chest pain. EXAM: CHEST - 2 VIEW COMPARISON:  02/15/2023 FINDINGS: Stable mild cardiomegaly. Stable pulmonary vascular congestion. No  evidence of acute infiltrate or pleural effusion. IMPRESSION: Stable mild cardiomegaly and pulmonary vascular congestion. No active infiltrate. Electronically Signed   By: Danae Orleans M.D.   On: 07/13/2023 14:25    LOS: 1 day   Total time spent in review of labs and imaging, patient evaluation, formulation of plan, documentation and communication with family: 35 minutes  Lanae Boast, MD [facility]  07/14/2023, 9:54 AM

## 2023-07-14 NOTE — Progress Notes (Signed)
Cardiologist:  Bensimohn  Subjective:  Denies SSCP, palpitations or Dyspnea Now in ER bed 5   Objective:  Vitals:   07/14/23 0230 07/14/23 0249 07/14/23 0445 07/14/23 0645  BP: (!) 111/49  (!) 115/55   Pulse: 64  65   Resp: 13  14   Temp:  98 F (36.7 C)  97.9 F (36.6 C)  TempSrc:  Oral  Oral  SpO2: 94%  94%   Weight:      Height:        Intake/Output from previous day:  Intake/Output Summary (Last 24 hours) at 07/14/2023 1020 Last data filed at 07/13/2023 1916 Gross per 24 hour  Intake 100 ml  Output --  Net 100 ml    Physical Exam: Overweight white female Lungs clear Apical MR murmur  Abdomen benign Trace edema Left great toa and 4 th toe amputation   Lab Results: Basic Metabolic Panel: Recent Labs    07/13/23 1325 07/13/23 1430 07/14/23 0512  NA 134* 138 137  K 4.3 4.2 3.9  CL 101  --  106  CO2 22  --  22  GLUCOSE 328*  --  123*  BUN 67*  --  65*  CREATININE 1.83*  --  2.11*  CALCIUM 8.7*  --  8.5*   Liver Function Tests: Recent Labs    07/12/23 1105  AST 21  ALT 25  ALKPHOS 226*  BILITOT 0.5  PROT 7.3  ALBUMIN 4.0   No results for input(s): "LIPASE", "AMYLASE" in the last 72 hours. CBC: Recent Labs    07/12/23 1105 07/13/23 1325 07/13/23 1325 07/13/23 1430 07/14/23 0512  WBC 11.0* 14.6*  --   --  10.0  NEUTROABS 8.0*  --   --   --   --   HGB 9.9* 10.1*   < > 11.2* 9.3*  HCT 31.5* 32.1*   < > 33.0* 29.6*  MCV 88 87.7  --   --  88.6  PLT 256 250  --   --  239   < > = values in this interval not displayed.    Fasting Lipid Panel: Recent Labs    07/12/23 1105  CHOL 92*  HDL 47  LDLCALC 28  TRIG 81  CHOLHDL 2.0     Imaging: CT Angio Chest/Abd/Pel for Dissection W and/or Wo Contrast Result Date: 07/13/2023 CLINICAL DATA:  Back and chest pain EXAM: CT ANGIOGRAPHY CHEST, ABDOMEN AND PELVIS TECHNIQUE: Non-contrast CT of the chest was initially obtained. Multidetector CT imaging through the chest, abdomen and pelvis  was performed using the standard protocol during bolus administration of intravenous contrast. Multiplanar reconstructed images and MIPs were obtained and reviewed to evaluate the vascular anatomy. RADIATION DOSE REDUCTION: This exam was performed according to the departmental dose-optimization program which includes automated exposure control, adjustment of the mA and/or kV according to patient size and/or use of iterative reconstruction technique. CONTRAST:  75mL OMNIPAQUE IOHEXOL 350 MG/ML SOLN COMPARISON:  08/29/2020, 07/13/2023 FINDINGS: CTA CHEST FINDINGS Cardiovascular: No evidence of thoracic aortic aneurysm or dissection. Borderline cardiomegaly without pericardial effusion. Post therapeutic changes from mitral valve repair. Diffuse atherosclerosis of the coronary vasculature. Chronic retained 1.3 cm metallic object within the left lower lobe pulmonary artery, stable since the 08/29/2020 exam, likely pulmonary arterial pressure monitor. No other filling defects or pulmonary emboli. Mediastinum/Nodes: Persistent borderline enlarged mediastinal and hilar lymph nodes, not significantly changed since prior exam. Index lymph node in the pretracheal region image 26/6 measures 14 mm in short axis,  previously 12 mm. Thyroid, trachea, and esophagus are unremarkable. Lungs/Pleura: The diffuse interlobular septal thickening, greatest at the bases, consistent with mild interstitial edema. Scattered hypoventilatory changes at the lung bases. No airspace disease, effusion, or pneumothorax. Central airways are patent. Musculoskeletal: No acute or destructive bony abnormalities. Prior healed bilateral rib fractures. Reconstructed images demonstrate no additional findings. Review of the MIP images confirms the above findings. CTA ABDOMEN AND PELVIS FINDINGS VASCULAR Aorta: Normal caliber aorta without aneurysm, dissection, vasculitis or significant stenosis. Mild atherosclerosis. Celiac: Patent without evidence of aneurysm,  dissection, vasculitis or significant stenosis. Diffuse atherosclerosis. SMA: Patent without evidence of aneurysm, dissection, vasculitis or significant stenosis. Diffuse atherosclerosis. Renals: Accessory right renal artery is noted. The bilateral renal arteries are patent without evidence of aneurysm, dissection, vasculitis, fibromuscular dysplasia or significant stenosis. IMA: Patent without evidence of aneurysm, dissection, vasculitis or significant stenosis. Inflow: Patent without evidence of aneurysm, dissection, vasculitis or significant stenosis. Diffuse atherosclerosis. Veins: No obvious venous abnormality within the limitations of this arterial phase study. Review of the MIP images confirms the above findings. NON-VASCULAR Hepatobiliary: No focal liver abnormality is seen. Status post cholecystectomy. No biliary dilatation. Pancreas: Unremarkable. No pancreatic ductal dilatation or surrounding inflammatory changes. Spleen: Normal in size without focal abnormality. Adrenals/Urinary Tract: Adrenal glands are unremarkable. Kidneys are normal, without renal calculi, focal lesion, or hydronephrosis. There is gas within the bladder lumen, please correlate with any recent instrumentation or catheterization. Stomach/Bowel: No bowel obstruction or ileus. No bowel wall thickening or inflammatory change. Lymphatic: No pathologic adenopathy within the abdomen or pelvis. Reproductive: Status post hysterectomy. No adnexal masses. Other: Trace free fluid within the right upper quadrant, nonspecific. No free intraperitoneal gas. No abdominal wall hernia. Musculoskeletal: No acute or destructive bony abnormalities. Reconstructed images demonstrate no additional findings. Review of the MIP images confirms the above findings. IMPRESSION: Vascular 1. No evidence of thoracoabdominal aortic aneurysm or dissection. 2. No evidence of acute pulmonary embolus. 3. Aortic Atherosclerosis (ICD10-I70.0). Coronary artery  atherosclerosis. Nonvascular: 1. Diffuse interlobular septal thickening favoring interstitial edema. 2. Stable mediastinal and hilar lymphadenopathy, nonspecific. 3. Trace ascites right upper quadrant, nonspecific. 4. Gas within the bladder lumen, likely due to recent catheterization or instrumentation. Electronically Signed   By: Sharlet Salina M.D.   On: 07/13/2023 16:37   DG Chest 2 View Result Date: 07/13/2023 CLINICAL DATA:  Chest pain. EXAM: CHEST - 2 VIEW COMPARISON:  02/15/2023 FINDINGS: Stable mild cardiomegaly. Stable pulmonary vascular congestion. No evidence of acute infiltrate or pleural effusion. IMPRESSION: Stable mild cardiomegaly and pulmonary vascular congestion. No active infiltrate. Electronically Signed   By: Danae Orleans M.D.   On: 07/13/2023 14:25    Cardiac Studies:  ECG: NSR no acute ST changes    Telemetry:  NSR 07/14/2023   Echo: 12/09/22 EF 50-55% post mitral clip mild residual MR   Medications:    aspirin EC  81 mg Oral Daily   carvedilol  12.5 mg Oral BID   enoxaparin (LOVENOX) injection  40 mg Subcutaneous Q24H   ezetimibe  10 mg Oral QPM   gabapentin  100 mg Oral BID   insulin aspart  0-15 Units Subcutaneous TID WC   insulin aspart  0-5 Units Subcutaneous QHS   insulin glargine-yfgn  40 Units Subcutaneous QHS   pantoprazole  40 mg Oral Daily   ticagrelor  90 mg Oral BID      cefTRIAXone (ROCEPHIN)  IV     nitroGLYCERIN 10 mcg/min (07/13/23 1825)    Assessment/Plan:  Angina:  pain similar to her angina and relief with nitroglycerin. ECG non acute and troponin negative Continue home meds including DAT. Continue iv nitroglycerin since it has made her pain free. Heparin per Dr Rennis Golden not indicated now with negative troponin. Shared decision making favor diagnostic cath on Monday. I suspect that issue is the non revascularized ostial RCA that was noted to be 80% with catheter damping. Her Cr is 1.8 Hold her demedex and K tomorrow before cath Will hydrate  evening before since no CHF and EF low normal She normally has cath from femoral arteries as radials have not been easy to negotiate and interventions difficult   Shared Decision Making/Informed Consent The risks [stroke (1 in 1000), death (1 in 1000), kidney failure [usually temporary] (1 in 500), bleeding (1 in 200), allergic reaction [possibly serious] (1 in 200)], benefits (diagnostic support and management of coronary artery disease) and alternatives of a cardiac catheterization were discussed in detail with Ms. Wanita Chamberlain and she is willing to proceed.   2.  HLD:  continue repatha and zetia   3.  DM:  per hospitalist service SS insulin   4. Mitral Clip: Done 05/18/21 mild residual MR no need to repeat TTE this admission   5. UTI:  on rocephin per primary service ? Change to PO med   Charlton Haws 07/14/2023, 10:20 AM

## 2023-07-14 NOTE — ED Notes (Signed)
ED TO INPATIENT HANDOFF REPORT  ED Nurse Name and Phone #: [phone]  S Name/Age/Gender Dana [name] 62 y.o. female Room/Bed: 005C/005C  Code Status   Code Status: Full Code  Home/SNF/Other Home Patient oriented to: self, place, time, and situation Is this baseline? Yes   Triage Complete: Triage complete  Chief Complaint Angina pectoris [facility]) [I20.9]  Triage Note Patient arrived from home by EMS with complaint of chest pain that awoke her from sleep this am at 930am. Radiation to left arm and nausea with same. Took 324 asa prior to ems arrival and 3 sl ntg, ems gave additional sl ntg enroute. Patient has not taken lantus today and CBG 414 by ems   Allergies Allergies  Allergen Reactions   Naproxen Hives and Rash   Shellfish-Derived Products Hives, Swelling and Other (See Comments)    Angioedema    Strawberry (Diagnostic) Hives   Celecoxib Other (See Comments)    Stomach pain   Glucosamine Hives, Swelling and Other (See Comments)    Angioedema    Iron Hives and Other (See Comments)    IV iron transfusion - hives - Feb 2022 hospitalization  Patient said she CAN tolerate if given Benadryl first   Metoclopramide Other (See Comments)    Facial twitching and stuttering   Metolazone Other (See Comments)    Acute renal failure   Statins Other (See Comments)    Muscle pain    Level of Care/Admitting Diagnosis ED Disposition     ED Disposition  Admit   Condition  --   Comment  Hospital Area: [facility] [[id number]]  Level of Care: Telemetry Cardiac [[id number]]  May admit patient to [facility] or [facility] if equivalent level of care is available:: No  Covid Evaluation: Asymptomatic - no recent exposure (last 10 days) testing not required  Diagnosis: Angina pectoris [facility]) [[id number]]  Admitting Physician: Steffanie Rainwater [[id number]]  Attending Physician: Steffanie Rainwater [[id number]]  Certification:: I certify this patient will need inpatient services  for at least 2 midnights  Expected Medical Readiness: 07/16/2023          B Medical/Surgery History Past Medical History:  Diagnosis Date   Acquired absence of left great toe (HCC)    Acquired absence of other left toe(s) (HCC)    ACS (acute coronary syndrome) (HCC)    Anemia    Anoxic brain injury (HCC)    Anxiety    CAD (coronary artery disease)    DES mLAD 04/07/15; DES dRCA & DES pPDA 08/30/16; DES mCX & PTCA OM1 09/29/20   Chronic diastolic (congestive) heart failure (HCC)    Chronic pain    Chronic systolic (congestive) heart failure (HCC)    CKD (chronic kidney disease)    Contrast dye induced nephropathy, possible 09/03/2020   COPD (chronic obstructive pulmonary disease) (HCC)    COVID-19 virus infection 03/03/2023   Diabetes (HCC)type 1    Diastolic CHF (HCC)    Dyspnea    Family history of breast cancer 10/23/2021   Gastro-esophageal reflux disease without esophagitis    Hyperlipidemia    Hypertension    Hypertensive heart disease with heart failure (HCC)    Hypoxemia    Idiopathic gout, multiple sites    Leukocytosis 03/29/2022   Localized edema    Low back pain    Mitral regurgitation    Mitral regurgitation    Mixed hyperlipidemia    Mixed incontinence    Morbid (severe) obesity due to excess calories (  HCC)    Neuromuscular disorder (HCC)    Neuropathy    Non-ST elevation (NSTEMI) myocardial infarction (HCC)    08/29/20, 09/29/20   OSA (obstructive sleep apnea) 09/03/2020   Other specified hypothyroidism    PAF (paroxysmal atrial fibrillation) (HCC)    s/p pulmonary vein isolation by cryoablation 10/04/15 Dr. Rudolpho Sevin in [facility]   PEA (Pulseless electrical activity) [facility])    ~ 01/13/21 at [facility] during admission DKA, volume overload, possible CAP, hypoxia s/p ACLS with ROSC ~ 10-15   Pneumonia    Primary insomnia    Rheumatoid arthritis (HCC)    Stroke (HCC)    Thyroid disease    Type 1 diabetes mellitus (HCC)    Vitamin D deficiency     Past Surgical History:  Procedure Laterality Date   ABDOMINAL HYSTERECTOMY     Partial- Still has both ovaries   CARDIOVASCULAR STRESS TEST  08/13/2014   Nuclear; Normal   CARPAL TUNNEL RELEASE     CATARACT EXTRACTION, BILATERAL     CESAREAN SECTION     CHOLECYSTECTOMY     CORONARY ANGIOPLASTY WITH STENT PLACEMENT     3 blockages/ 1 stent   CORONARY PRESSURE/FFR STUDY N/A 09/07/2022   Procedure: INTRAVASCULAR PRESSURE WIRE/FFR STUDY;  Surgeon: Swaziland, Peter M, MD;  [facility]: [facility] INVASIVE CV LAB;  Service: Cardiovascular;  Laterality: N/A;   CORONARY STENT INTERVENTION N/A 09/29/2020   Procedure: CORONARY STENT INTERVENTION;  Surgeon: Tonny Bollman, MD;  Location: [facility] INVASIVE CV LAB;  Service: Cardiovascular;  Laterality: N/A;  CFX   CORONARY STENT INTERVENTION N/A 09/07/2022   Procedure: CORONARY STENT INTERVENTION;  Surgeon: Swaziland, Peter M, MD;  Location: [facility] INVASIVE CV LAB;  Service: Cardiovascular;  Laterality: N/A;   CORONARY STENT INTERVENTION N/A 02/13/2023   Procedure: CORONARY STENT INTERVENTION;  Surgeon: Kathleene Hazel, MD;  Location: [facility] INVASIVE CV LAB;  Service: Cardiovascular;  Laterality: N/A;  LAD   CORONARY ULTRASOUND/IVUS N/A 02/13/2023   Procedure: Coronary Ultrasound/IVUS;  Surgeon: Kathleene Hazel, MD;  Location: [facility] INVASIVE CV LAB;  Service: Cardiovascular;  Laterality: N/A;   CORONARY/GRAFT ACUTE MI REVASCULARIZATION N/A 12/08/2022   Procedure: Coronary/Graft Acute MI Revascularization;  Surgeon: Marykay Lex, MD;  Location: [facility] INVASIVE CV LAB;  Service: Cardiovascular;  Laterality: N/A;   EYE SURGERY     LEFT HEART CATH AND CORONARY ANGIOGRAPHY N/A 09/29/2020   Procedure: LEFT HEART CATH AND CORONARY ANGIOGRAPHY;  Surgeon: Tonny Bollman, MD;  Location: [facility] INVASIVE CV LAB;  Service: Cardiovascular;  Laterality: N/A;   LEFT HEART CATH AND CORONARY ANGIOGRAPHY N/A 09/07/2022   Procedure: LEFT HEART CATH AND CORONARY ANGIOGRAPHY;  Surgeon: Swaziland,  Peter M, MD;  Location: [facility] INVASIVE CV LAB;  Service: Cardiovascular;  Laterality: N/A;   LEFT HEART CATH AND CORONARY ANGIOGRAPHY N/A 12/08/2022   Procedure: LEFT HEART CATH AND CORONARY ANGIOGRAPHY;  Surgeon: Marykay Lex, MD;  Location: [facility] INVASIVE CV LAB;  Service: Cardiovascular;  Laterality: N/A;   LEFT HEART CATH AND CORONARY ANGIOGRAPHY N/A 02/13/2023   Procedure: LEFT HEART CATH AND CORONARY ANGIOGRAPHY;  Surgeon: Kathleene Hazel, MD;  [facility]: [facility] CV LAB;  Service: Cardiovascular;  Laterality: N/A;   RIGHT HEART CATH N/A 04/27/2021   Procedure: RIGHT HEART CATH;  Surgeon: Dolores Patty, MD;  Location: [facility] INVASIVE CV LAB;  Service: Cardiovascular;  Laterality: N/A;   RIGHT/LEFT HEART CATH AND CORONARY ANGIOGRAPHY N/A 01/07/2017   Procedure: Right/Left Heart Cath and Coronary Angiography;  Surgeon: Marca Ancona  S, MD;  [facility]: [facility] CV LAB;  Service: Cardiovascular;  Laterality: N/A;   ROTATOR CUFF REPAIR     TEE WITHOUT CARDIOVERSION N/A 04/28/2021   Procedure: TRANSESOPHAGEAL ECHOCARDIOGRAM (TEE);  Surgeon: Dolores Patty, MD;  Location: [facility] ENDOSCOPY;  Service: Cardiovascular;  Laterality: N/A;   TEE WITHOUT CARDIOVERSION N/A 05/18/2021   Procedure: TRANSESOPHAGEAL ECHOCARDIOGRAM (TEE);  Surgeon: Orbie Pyo, MD;  Location: [facility] INVASIVE CV LAB;  Service: Cardiovascular;  Laterality: N/A;   TOE AMPUTATION Left    TRANSCATHETER MITRAL EDGE TO EDGE REPAIR N/A 05/18/2021   Procedure: MITRAL VALVE REPAIR;  Surgeon: Orbie Pyo, MD;  [facility] INVAS[facility] CV LAB;  Service: Cardiovascular;  Laterality: N/A;   US ECHOCARDIOGRAPHY  05/2017   Normal     A IV Location/Drains/Wounds Patient Lines/Drains/Airways Status     Active Line/Drains/Airways     Name Placement date Placement time Site Days   Peripheral IV 02/16/23 22 G 1.75" Anterior;Right Forearm 02/16/23  1249  Forearm  148   Peripheral IV 07/13/23 18 G Right Antecubital 07/13/23  1200   Antecubital  1   Peripheral IV 07/13/23 20 G Right Hand 07/13/23  1532  Hand  1            Intake/Output Last 24 hours  Intake/Output Summary (Last 24 hours) at 07/14/2023 1353 Last data filed at 07/13/2023 1916 Gross per 24 hour  Intake 100 ml  Output --  Net 100 ml    Labs/Imaging Results for orders placed or performed during the hospital encounter of 07/13/23 (from the past 48 hours)  CBG monitoring, ED     Status: Abnormal   Collection Time: 07/13/23 12:44 PM  Result Value Ref Range   Glucose-Capillary 269 (H) 70 - 99 mg/dL    Comment: Glucose reference range applies only to samples taken after fasting for at least 8 hours.   Comment 1 Notify RN    Comment 2 Document in Chart   Basic metabolic panel     Status: Abnormal   Collection Time: 07/13/23  1:25 PM  Result Value Ref Range   Sodium 134 (L) 135 - 145 mmol/L   Potassium 4.3 3.5 - 5.1 mmol/L   Chloride 101 98 - 111 mmol/L   CO2 22 22 - 32 mmol/L   Glucose, Bld 328 (H) 70 - 99 mg/dL    Comment: Glucose reference range applies only to samples taken after fasting for at least 8 hours.   BUN 67 (H) 6 - 20 mg/dL   Creatinine, Ser 6.21 (H) 0.44 - 1.00 mg/dL   Calcium 8.7 (L) 8.9 - 10.3 mg/dL   GFR, Estimated 31 (L) >60 mL/min    Comment: (NOTE) Calculated using the CKD-EPI Creatinine Equation (2021)    Anion gap 11 5 - 15    Comment: Performed at [facility], [address], [place], [place] [address]  CBC     Status: Abnormal   Collection Time: 07/13/23  1:25 PM  Result Value Ref Range   WBC 14.6 (H) 4.0 - 10.5 K/uL   RBC 3.66 (L) 3.87 - 5.11 MIL/uL   Hemoglobin 10.1 (L) 12.0 - 15.0 g/dL   HCT 78.4 (L) 69.6 - 29.5 %   MCV 87.7 80.0 - 100.0 fL   MCH 27.6 26.0 - 34.0 pg   MCHC 31.5 30.0 - 36.0 g/dL   RDW 28.4 13.2 - 44.0 %   Platelets 250 150 - 400 K/uL   nRBC 0.0 0.0 -  0.2 %    Comment: Performed at [facility], [address] [place], [place] [address]  Troponin I (High Sensitivity)      Status: Abnormal   Collection Time: 07/13/23  1:25 PM  Result Value Ref Range   Troponin I (High Sensitivity) 21 (H) <18 ng/L    Comment: (NOTE) Elevated high sensitivity troponin I (hsTnI) values and significant  changes across serial measurements may suggest ACS but many other  chronic and acute conditions are known to elevate hsTnI results.  Refer to the "Links" section for chest pain algorithms and additional  guidance. Performed at [facility], [address], [place], [place] [address]   Brain natriuretic peptide     Status: Abnormal   Collection Time: 07/13/23  1:25 PM  Result Value Ref Range   B Natriuretic Peptide 212.4 (H) 0.0 - 100.0 pg/mL    Comment: Performed at [facility], [address], [place], [place] [address]  CBG monitoring, ED     Status: Abnormal   Collection Time: 07/13/23  1:46 PM  Result Value Ref Range   Glucose-Capillary 319 (H) 70 - 99 mg/dL    Comment: Glucose reference range applies only to samples taken after fasting for at least 8 hours.  Beta-hydroxybutyric acid     Status: Abnormal   Collection Time: 07/13/23  2:22 PM  Result Value Ref Range   Beta-Hydroxybutyric Acid 0.30 (H) 0.05 - 0.27 mmol/L    Comment: Performed at [facility], [address] [place], [place] [address]  I-Stat venous blood gas, [facility] ED, [facility], [facility])     Status: Abnormal   Collection Time: 07/13/23  2:30 PM  Result Value Ref Range   pH, Ven 7.366 7.25 - 7.43   pCO2, Ven 44.7 44 - 60 mmHg   pO2, Ven 29 (LL) 32 - 45 mmHg   Bicarbonate 25.6 20.0 - 28.0 mmol/L   TCO2 27 22 - 32 mmol/L   O2 Saturation 53 %   Acid-Base Excess 0.0 0.0 - 2.0 mmol/L   Sodium 138 135 - 145 mmol/L   Potassium 4.2 3.5 - 5.1 mmol/L   Calcium, Ion 1.12 (L) 1.15 - 1.40 mmol/L   HCT 33.0 (L) 36.0 - 46.0 %   Hemoglobin 11.2 (L) 12.0 - 15.0 g/dL   Sample type VENOUS    Comment NOTIFIED PHYSICIAN   Troponin I (High Sensitivity)     Status: Abnormal   Collection Time: 07/13/23  3:05  PM  Result Value Ref Range   Troponin I (High Sensitivity) 31 (H) <18 ng/L    Comment: (NOTE) Elevated high sensitivity troponin I (hsTnI) values and significant  changes across serial measurements may suggest ACS but many other  chronic and acute conditions are known to elevate hsTnI results.  Refer to the "Links" section for chest pain algorithms and additional  guidance. Performed at [facility], [address], [place], [place] [address]   Urinalysis, w/ Reflex to Culture (Infection Suspected) -Urine, Clean Catch     Status: Abnormal   Collection Time: 07/13/23  4:45 PM  Result Value Ref Range   Specimen Source URINE, CLEAN CATCH    Color, Urine YELLOW YELLOW   APPearance HAZY (A) CLEAR   Specific Gravity, Urine 1.010 1.005 - 1.030   pH 5.0 5.0 - 8.0   Glucose, UA 50 (A) NEGATIVE mg/dL   Hgb urine dipstick NEGATIVE NEGATIVE   Bilirubin Urine NEGATIVE NEGATIVE   Ketones,  ur NEGATIVE NEGATIVE mg/dL   Protein, ur 30 (A) NEGATIVE mg/dL   Nitrite NEGATIVE NEGATIVE   Leukocytes,Ua MODERATE (A) NEGATIVE   RBC / HPF 0-5 0 - 5 RBC/hpf   WBC, UA 21-50 0 - 5 WBC/hpf    Comment:        Reflex urine culture not performed if WBC <=10, OR if Squamous epithelial cells >5. If Squamous epithelial cells >5 suggest recollection.    Bacteria, UA MANY (A) NONE SEEN   Squamous Epithelial / HPF 0-5 0 - 5 /HPF   WBC Clumps PRESENT     Comment: Performed at [facility], [address], [place], [place] [address]  Urine Culture     Status: Abnormal (Preliminary result)   Collection Time: 07/13/23  4:45 PM   Specimen: Urine, Random  Result Value Ref Range   Specimen Description URINE, RANDOM    Special Requests NONE Reflexed from [id number]    Culture (A)     >=100,000 COLONIES/mL GRAM NEGATIVE RODS IDENTIFICATION TO FOLLOW Performed at [facility], [address] [place], [place] [address]    Report Status PENDING   CBG monitoring, ED     Status: Abnormal   Collection  Time: 07/13/23  9:18 PM  Result Value Ref Range   Glucose-Capillary 219 (H) 70 - 99 mg/dL    Comment: Glucose reference range applies only to samples taken after fasting for at least 8 hours.  CBC     Status: Abnormal   Collection Time: 07/14/23  5:12 AM  Result Value Ref Range   WBC 10.0 4.0 - 10.5 K/uL   RBC 3.34 (L) 3.87 - 5.11 MIL/uL   Hemoglobin 9.3 (L) 12.0 - 15.0 g/dL   HCT 69.6 (L) 29.5 - 28.4 %   MCV 88.6 80.0 - 100.0 fL   MCH 27.8 26.0 - 34.0 pg   MCHC 31.4 30.0 - 36.0 g/dL   RDW 13.2 44.0 - 10.2 %   Platelets 239 150 - 400 K/uL   nRBC 0.0 0.0 - 0.2 %    Comment: Performed at [facility], [address] [place], [place] [address]  Basic metabolic panel     Status: Abnormal   Collection Time: 07/14/23  5:12 AM  Result Value Ref Range   Sodium 137 135 - 145 mmol/L   Potassium 3.9 3.5 - 5.1 mmol/L   Chloride 106 98 - 111 mmol/L   CO2 22 22 - 32 mmol/L   Glucose, Bld 123 (H) 70 - 99 mg/dL    Comment: Glucose reference range applies only to samples taken after fasting for at least 8 hours.   BUN 65 (H) 6 - 20 mg/dL   Creatinine, Ser 6.44 (H) 0.44 - 1.00 mg/dL   Calcium 8.5 (L) 8.9 - 10.3 mg/dL   GFR, Estimated 26 (L) >60 mL/min    Comment: (NOTE) Calculated using the CKD-EPI Creatinine Equation (2021)    Anion gap 9 5 - 15    Comment: Performed at [facility], [address], [place], [place] [address]  CBG monitoring, ED     Status: Abnormal   Collection Time: 07/14/23  8:05 AM  Result Value Ref Range   Glucose-Capillary 112 (H) 70 - 99 mg/dL    Comment: Glucose reference range applies only to samples taken after fasting for at least 8 hours.  CBG monitoring, ED     Status: Abnormal   Collection Time: 07/14/23 12:32 PM  Result Value  Ref Range   Glucose-Capillary 145 (H) 70 - 99 mg/dL    Comment: Glucose reference range applies only to samples taken after fasting for at least 8 hours.   CT Angio Chest/Abd/Pel for Dissection W and/or Wo  Contrast Result Date: 07/13/2023 CLINICAL DATA:  Back and chest pain EXAM: CT ANGIOGRAPHY CHEST, ABDOMEN AND PELVIS TECHNIQUE: Non-contrast CT of the chest was initially obtained. Multidetector CT imaging through the chest, abdomen and pelvis was performed using the standard protocol during bolus administration of intravenous contrast. Multiplanar reconstructed images and MIPs were obtained and reviewed to evaluate the vascular anatomy. RADIATION DOSE REDUCTION: This exam was performed according to the departmental dose-optimization program which includes automated exposure control, adjustment of the mA and/or kV according to patient size and/or use of iterative reconstruction technique. CONTRAST:  75mL OMNIPAQUE IOHEXOL 350 MG/ML SOLN COMPARISON:  08/29/2020, 07/13/2023 FINDINGS: CTA CHEST FINDINGS Cardiovascular: No evidence of thoracic aortic aneurysm or dissection. Borderline cardiomegaly without pericardial effusion. Post therapeutic changes from mitral valve repair. Diffuse atherosclerosis of the coronary vasculature. Chronic retained 1.3 cm metallic object within the left lower lobe pulmonary artery, stable since the 08/29/2020 exam, likely pulmonary arterial pressure monitor. No other filling defects or pulmonary emboli. Mediastinum/Nodes: Persistent borderline enlarged mediastinal and hilar lymph nodes, not significantly changed since prior exam. Index lymph node in the pretracheal region image 26/6 measures 14 mm in short axis, previously 12 mm. Thyroid, trachea, and esophagus are unremarkable. Lungs/Pleura: The diffuse interlobular septal thickening, greatest at the bases, consistent with mild interstitial edema. Scattered hypoventilatory changes at the lung bases. No airspace disease, effusion, or pneumothorax. Central airways are patent. Musculoskeletal: No acute or destructive bony abnormalities. Prior healed bilateral rib fractures. Reconstructed images demonstrate no additional findings. Review of  the MIP images confirms the above findings. CTA ABDOMEN AND PELVIS FINDINGS VASCULAR Aorta: Normal caliber aorta without aneurysm, dissection, vasculitis or significant stenosis. Mild atherosclerosis. Celiac: Patent without evidence of aneurysm, dissection, vasculitis or significant stenosis. Diffuse atherosclerosis. SMA: Patent without evidence of aneurysm, dissection, vasculitis or significant stenosis. Diffuse atherosclerosis. Renals: Accessory right renal artery is noted. The bilateral renal arteries are patent without evidence of aneurysm, dissection, vasculitis, fibromuscular dysplasia or significant stenosis. IMA: Patent without evidence of aneurysm, dissection, vasculitis or significant stenosis. Inflow: Patent without evidence of aneurysm, dissection, vasculitis or significant stenosis. Diffuse atherosclerosis. Veins: No obvious venous abnormality within the limitations of this arterial phase study. Review of the MIP images confirms the above findings. NON-VASCULAR Hepatobiliary: No focal liver abnormality is seen. Status post cholecystectomy. No biliary dilatation. Pancreas: Unremarkable. No pancreatic ductal dilatation or surrounding inflammatory changes. Spleen: Normal in size without focal abnormality. Adrenals/Urinary Tract: Adrenal glands are unremarkable. Kidneys are normal, without renal calculi, focal lesion, or hydronephrosis. There is gas within the bladder lumen, please correlate with any recent instrumentation or catheterization. Stomach/Bowel: No bowel obstruction or ileus. No bowel wall thickening or inflammatory change. Lymphatic: No pathologic adenopathy within the abdomen or pelvis. Reproductive: Status post hysterectomy. No adnexal masses. Other: Trace free fluid within the right upper quadrant, nonspecific. No free intraperitoneal gas. No abdominal wall hernia. Musculoskeletal: No acute or destructive bony abnormalities. Reconstructed images demonstrate no additional findings. Review of  the MIP images confirms the above findings. IMPRESSION: Vascular 1. No evidence of thoracoabdominal aortic aneurysm or dissection. 2. No evidence of acute pulmonary embolus. 3. Aortic Atherosclerosis (ICD10-I70.0). Coronary artery atherosclerosis. Nonvascular: 1. Diffuse interlobular septal thickening favoring interstitial edema. 2. Stable mediastinal and hilar lymphadenopathy, nonspecific. 3. Trace ascites  right upper quadrant, nonspecific. 4. Gas within the bladder lumen, likely due to recent catheterization or instrumentation. Electronically Signed   By: Sharlet Salina M.D.   On: 07/13/2023 16:37   DG Chest 2 View Result Date: 07/13/2023 CLINICAL DATA:  Chest pain. EXAM: CHEST - 2 VIEW COMPARISON:  02/15/2023 FINDINGS: Stable mild cardiomegaly. Stable pulmonary vascular congestion. No evidence of acute infiltrate or pleural effusion. IMPRESSION: Stable mild cardiomegaly and pulmonary vascular congestion. No active infiltrate. Electronically Signed   By: Danae Orleans M.D.   On: 07/13/2023 14:25    Pending Labs Unresulted Labs (From admission, onward)    None       Vitals/Pain Today's Vitals   07/14/23 0645 07/14/23 1020 07/14/23 1030 07/14/23 1100  BP:  (!) 145/60 (!) 145/65   Pulse:  72 70   Resp:   16   Temp: 97.9 F (36.6 C)   98.1 F (36.7 C)  TempSrc: Oral     SpO2:   94%   Weight:      Height:      PainSc:        Isolation Precautions No active isolations  Medications Medications  nitroGLYCERIN 50 mg in dextrose 5 % 250 mL (0.2 mg/mL) infusion (10 mcg/min Intravenous Rate/Dose Change 07/13/23 1825)  aspirin chewable tablet 81 mg (has no administration in time range)  0.9% sodium chloride infusion (1 mL/kg/hr  105.2 kg Intravenous New Bag/Given 07/14/23 1045)  enoxaparin (LOVENOX) injection 40 mg (0 mg Subcutaneous Hold 07/13/23 2016)  ondansetron (ZOFRAN) injection 4 mg (has no administration in time range)    Or  ondansetron (ZOFRAN) tablet 4 mg (has no  administration in time range)  acetaminophen (TYLENOL) tablet 650 mg (has no administration in time range)  insulin aspart (novoLOG) injection 0-15 Units (2 Units Subcutaneous Given 07/14/23 1306)  insulin aspart (novoLOG) injection 0-5 Units (2 Units Subcutaneous Given 07/13/23 2134)  insulin glargine-yfgn (SEMGLEE) injection 40 Units (40 Units Subcutaneous Given 07/13/23 2310)  carvedilol (COREG) tablet 12.5 mg (12.5 mg Oral Given 07/14/23 1020)  ezetimibe (ZETIA) tablet 10 mg (10 mg Oral Given 07/13/23 2134)  gabapentin (NEURONTIN) capsule 100 mg (100 mg Oral Given 07/14/23 1020)  HYDROcodone-acetaminophen (NORCO) 7.5-325 MG per tablet 1 tablet (1 tablet Oral Given 07/13/23 2133)  pantoprazole (PROTONIX) EC tablet 40 mg (40 mg Oral Given 07/14/23 1020)  ticagrelor (BRILINTA) tablet 90 mg (90 mg Oral Given 07/14/23 1020)  LORazepam (ATIVAN) tablet 0.5 mg (0.5 mg Oral Given 07/14/23 1237)  aspirin EC tablet 81 mg (81 mg Oral Given 07/14/23 1020)  cefTRIAXone (ROCEPHIN) 1 g in sodium chloride 0.9 % 100 mL IVPB (has no administration in time range)  morphine (PF) 4 MG/ML injection 4 mg (4 mg Intravenous Given 07/13/23 1427)  ondansetron (ZOFRAN) injection 4 mg (4 mg Intravenous Given 07/13/23 1426)  iohexol (OMNIPAQUE) 350 MG/ML injection 75 mL (75 mLs Intravenous Contrast Given 07/13/23 1514)  cefTRIAXone (ROCEPHIN) 1 g in sodium chloride 0.9 % 100 mL IVPB (0 g Intravenous Stopped 07/13/23 1916)    Mobility walks with device     Focused Assessments    R Recommendations: See Admitting Provider Note  Report given to:   Additional Notes:

## 2023-07-14 NOTE — Progress Notes (Signed)
Patient has declined Cpap for the night. States she can only tolerate nasal pillows and will be fine with her O2 for the night

## 2023-07-14 NOTE — ED Notes (Signed)
PRN 0.5 mg Ativan and emotional support provided to pt.

## 2023-07-14 NOTE — Hospital Course (Addendum)
anxiety, chronic back pain, CKD 3B, OSA on CPAP, COPD, diastolic HF, HTN, HLD, gout, GERD, T1DM, and multivessel CAD/NSTEMI s/p multiple PCI's and complications including contrast-induced nephropathy who presents with chest pain  In ED, vitals stable afebrile, labs with creatinine 2.0 BUN 70 hemoglobin 9.9 g, troponin elevated at 21-31 flat, cardiology consulted and admitted for angina. EKG without ischemic changes.  Patient was placed in nitro drip with plan for cardiac cath on weekday> but due to elevated creatinine 12/30 cath postponed pending improvement in renal function.  12/31>Blood sugar dropped in 65 but asymptomatic,semglee decreased from 40u to 30 u.

## 2023-07-15 ENCOUNTER — Inpatient Hospital Stay (HOSPITAL_COMMUNITY): Payer: PPO

## 2023-07-15 ENCOUNTER — Encounter (HOSPITAL_COMMUNITY): Payer: Self-pay | Admitting: Internal Medicine

## 2023-07-15 DIAGNOSIS — I2089 Other forms of angina pectoris: Secondary | ICD-10-CM

## 2023-07-15 DIAGNOSIS — I509 Heart failure, unspecified: Secondary | ICD-10-CM | POA: Diagnosis not present

## 2023-07-15 DIAGNOSIS — I209 Angina pectoris, unspecified: Secondary | ICD-10-CM | POA: Diagnosis not present

## 2023-07-15 LAB — ECHOCARDIOGRAM LIMITED
Area-P 1/2: 3.72 cm2
Height: 65 in
S' Lateral: 3.4 cm
Weight: 3696.67 [oz_av]

## 2023-07-15 LAB — BASIC METABOLIC PANEL
Anion gap: 7 (ref 5–15)
BUN: 69 mg/dL — ABNORMAL HIGH (ref 6–20)
CO2: 21 mmol/L — ABNORMAL LOW (ref 22–32)
Calcium: 8.7 mg/dL — ABNORMAL LOW (ref 8.9–10.3)
Chloride: 110 mmol/L (ref 98–111)
Creatinine, Ser: 2.76 mg/dL — ABNORMAL HIGH (ref 0.44–1.00)
GFR, Estimated: 19 mL/min — ABNORMAL LOW (ref >60)
Glucose, Bld: 72 mg/dL (ref 70–99)
Potassium: 4.5 mmol/L (ref 3.5–5.1)
Sodium: 138 mmol/L (ref 135–145)

## 2023-07-15 LAB — CBC
HCT: 29.5 % — ABNORMAL LOW (ref 36.0–46.0)
Hemoglobin: 9.2 g/dL — ABNORMAL LOW (ref 12.0–15.0)
MCH: 27.8 pg (ref 26.0–34.0)
MCHC: 31.2 g/dL (ref 30.0–36.0)
MCV: 89.1 fL (ref 80.0–100.0)
Platelets: 237 10*3/uL (ref 150–400)
RBC: 3.31 MIL/uL — ABNORMAL LOW (ref 3.87–5.11)
RDW: 14.6 % (ref 11.5–15.5)
WBC: 9.9 10*3/uL (ref 4.0–10.5)
nRBC: 0 % (ref 0.0–0.2)

## 2023-07-15 LAB — GLUCOSE, CAPILLARY
Glucose-Capillary: 116 mg/dL — ABNORMAL HIGH (ref 70–99)
Glucose-Capillary: 69 mg/dL — ABNORMAL LOW (ref 70–99)
Glucose-Capillary: 96 mg/dL (ref 70–99)
Glucose-Capillary: 116 mg/dL — ABNORMAL HIGH (ref 70–99)
Glucose-Capillary: 163 mg/dL — ABNORMAL HIGH (ref 70–99)

## 2023-07-15 LAB — URINE CULTURE: Culture: >=100000 — AB

## 2023-07-15 MED ORDER — CEFADROXIL 500 MG PO CAPS
500.0000 mg | ORAL_CAPSULE | Freq: Every day | ORAL | Status: AC
  Administered 2023-07-15 – 2023-07-17 (×3): 500 mg via ORAL
  Filled 2023-07-15 (×3): qty 1

## 2023-07-15 MED ORDER — PERFLUTREN LIPID MICROSPHERE
1.0000 mL | INTRAVENOUS | Status: AC | PRN
  Administered 2023-07-15: 4 mL via INTRAVENOUS

## 2023-07-15 MED ORDER — INSULIN GLARGINE-YFGN 100 UNIT/ML ~~LOC~~ SOLN
30.0000 [IU] | Freq: Every day | SUBCUTANEOUS | Status: DC
  Administered 2023-07-15: 30 [IU] via SUBCUTANEOUS
  Filled 2023-07-15 (×2): qty 0.3

## 2023-07-15 MED ORDER — SODIUM CHLORIDE 0.9 % IV SOLN: INTRAVENOUS | Status: DC

## 2023-07-15 NOTE — Progress Notes (Addendum)
Rounding Note    Patient Name: Dana [name] Date of Encounter: 07/15/2023  [facility] Cardiologist: Arvilla Meres, MD   Subjective   62 yo with hx of CAD ,  admitted with angina   For cath today ( femoral access)  Has no questions about upcoming cath   Her creatinine is up today  Will postpone cath  Primary service to work up her elevated creatinine  Will repeat echo to make sure her reduce renal function is not due to CHF    Inpatient Medications    Scheduled Meds:  allopurinol  50 mg Oral Daily   aspirin EC  81 mg Oral Daily   busPIRone  10 mg Oral TID   carvedilol  12.5 mg Oral BID   enoxaparin (LOVENOX) injection  40 mg Subcutaneous Q24H   ezetimibe  10 mg Oral QPM   gabapentin  100 mg Oral BID   insulin aspart  0-15 Units Subcutaneous TID WC   insulin aspart  0-5 Units Subcutaneous QHS   insulin glargine-yfgn  40 Units Subcutaneous QHS   latanoprost  1 drop Both Eyes QHS   magnesium oxide  800 mg Oral Daily   omega-3 acid ethyl esters  1 g Oral Daily   pantoprazole  40 mg Oral Daily   ticagrelor  90 mg Oral BID   Continuous Infusions:  sodium chloride 1 mL/kg/hr (07/15/23 0606)   cefTRIAXone (ROCEPHIN)  IV 1 g (07/14/23 1811)   nitroGLYCERIN 10 mcg/min (07/13/23 1825)   PRN Meds: acetaminophen, HYDROcodone-acetaminophen, hydrOXYzine, linaclotide, ondansetron (ZOFRAN) IV **OR** ondansetron   Vital Signs    Vitals:   07/14/23 2200 07/14/23 2348 07/15/23 0529 07/15/23 0817  BP: 126/63 (!) 121/55 (!) 123/55 (!) 131/59  Pulse:  68 72 72  Resp:  18 17 18   Temp:  97.6 F (36.4 C) 98.1 F (36.7 C) 98.1 F (36.7 C)  TempSrc:  Oral Oral Oral  SpO2:  98% 96% 95%  Weight:      Height:        Intake/Output Summary (Last 24 hours) at 07/15/2023 0847 Last data filed at 07/15/2023 0606 Gross per 24 hour  Intake 2484.95 ml  Output --  Net 2484.95 ml      07/14/2023    4:00 PM 07/13/2023    8:33 PM 07/12/2023   10:42 AM  Last 3  Weights  Weight (lbs) 231 lb 0.7 oz 231 lb 7.7 oz 232 lb  Weight (kg) 104.8 kg 105 kg 105.235 kg      Telemetry    NSR   - Personally Reviewed  ECG     - Personally Reviewed  Physical Exam   GEN: No acute distress.   Neck: No JVD Cardiac: RRR, soft systolic murmur  Respiratory: fines rales in bases.  GI: Soft, nontender, non-distended  MS: No edema; No deformity. Neuro:  Nonfocal  Psych: Normal affect   Labs    High Sensitivity Troponin:   Recent Labs  Lab 07/13/23 1325 07/13/23 1505  TROPONINIHS 21* 31*     Chemistry Recent Labs  Lab 07/12/23 1105 07/13/23 1325 07/13/23 1430 07/14/23 0512  NA 139 134* 138 137  K 4.4 4.3 4.2 3.9  CL 100 101  --  106  CO2 22 22  --  22  GLUCOSE 162* 328*  --  123*  BUN 70* 67*  --  65*  CREATININE 2.03* 1.83*  --  2.11*  CALCIUM 9.2 8.7*  --  8.5*  PROT  7.3  --   --   --   ALBUMIN 4.0  --   --   --   AST 21  --   --   --   ALT 25  --   --   --   ALKPHOS 226*  --   --   --   BILITOT 0.5  --   --   --   GFRNONAA  --  31*  --  26*  ANIONGAP  --  11  --  9    Lipids  Recent Labs  Lab 07/12/23 1105  CHOL 92*  TRIG 81  HDL 47  LABVLDL 17  LDLCALC 28  CHOLHDL 2.0    Hematology Recent Labs  Lab 07/12/23 1105 07/13/23 1325 07/13/23 1430 07/14/23 0512  WBC 11.0* 14.6*  --  10.0  RBC 3.58* 3.66*  --  3.34*  HGB 9.9* 10.1* 11.2* 9.3*  HCT 31.5* 32.1* 33.0* 29.6*  MCV 88 87.7  --  88.6  MCH 27.7 27.6  --  27.8  MCHC 31.4* 31.5  --  31.4  RDW 13.9 14.4  --  14.4  PLT 256 250  --  239   Thyroid No results for input(s): "TSH", "FREET4" in the last 168 hours.  BNP Recent Labs  Lab 07/13/23 1325  BNP 212.4*    DDimer No results for input(s): "DDIMER" in the last 168 hours.   Radiology    CT Angio Chest/Abd/Pel for Dissection W and/or Wo Contrast Result Date: 07/13/2023 CLINICAL DATA:  Back and chest pain EXAM: CT ANGIOGRAPHY CHEST, ABDOMEN AND PELVIS TECHNIQUE: Non-contrast CT of the chest was initially  obtained. Multidetector CT imaging through the chest, abdomen and pelvis was performed using the standard protocol during bolus administration of intravenous contrast. Multiplanar reconstructed images and MIPs were obtained and reviewed to evaluate the vascular anatomy. RADIATION DOSE REDUCTION: This exam was performed according to the departmental dose-optimization program which includes automated exposure control, adjustment of the mA and/or kV according to patient size and/or use of iterative reconstruction technique. CONTRAST:  75mL OMNIPAQUE IOHEXOL 350 MG/ML SOLN COMPARISON:  08/29/2020, 07/13/2023 FINDINGS: CTA CHEST FINDINGS Cardiovascular: No evidence of thoracic aortic aneurysm or dissection. Borderline cardiomegaly without pericardial effusion. Post therapeutic changes from mitral valve repair. Diffuse atherosclerosis of the coronary vasculature. Chronic retained 1.3 cm metallic object within the left lower lobe pulmonary artery, stable since the 08/29/2020 exam, likely pulmonary arterial pressure monitor. No other filling defects or pulmonary emboli. Mediastinum/Nodes: Persistent borderline enlarged mediastinal and hilar lymph nodes, not significantly changed since prior exam. Index lymph node in the pretracheal region image 26/6 measures 14 mm in short axis, previously 12 mm. Thyroid, trachea, and esophagus are unremarkable. Lungs/Pleura: The diffuse interlobular septal thickening, greatest at the bases, consistent with mild interstitial edema. Scattered hypoventilatory changes at the lung bases. No airspace disease, effusion, or pneumothorax. Central airways are patent. Musculoskeletal: No acute or destructive bony abnormalities. Prior healed bilateral rib fractures. Reconstructed images demonstrate no additional findings. Review of the MIP images confirms the above findings. CTA ABDOMEN AND PELVIS FINDINGS VASCULAR Aorta: Normal caliber aorta without aneurysm, dissection, vasculitis or significant  stenosis. Mild atherosclerosis. Celiac: Patent without evidence of aneurysm, dissection, vasculitis or significant stenosis. Diffuse atherosclerosis. SMA: Patent without evidence of aneurysm, dissection, vasculitis or significant stenosis. Diffuse atherosclerosis. Renals: Accessory right renal artery is noted. The bilateral renal arteries are patent without evidence of aneurysm, dissection, vasculitis, fibromuscular dysplasia or significant stenosis. IMA: Patent without evidence of aneurysm,  dissection, vasculitis or significant stenosis. Inflow: Patent without evidence of aneurysm, dissection, vasculitis or significant stenosis. Diffuse atherosclerosis. Veins: No obvious venous abnormality within the limitations of this arterial phase study. Review of the MIP images confirms the above findings. NON-VASCULAR Hepatobiliary: No focal liver abnormality is seen. Status post cholecystectomy. No biliary dilatation. Pancreas: Unremarkable. No pancreatic ductal dilatation or surrounding inflammatory changes. Spleen: Normal in size without focal abnormality. Adrenals/Urinary Tract: Adrenal glands are unremarkable. Kidneys are normal, without renal calculi, focal lesion, or hydronephrosis. There is gas within the bladder lumen, please correlate with any recent instrumentation or catheterization. Stomach/Bowel: No bowel obstruction or ileus. No bowel wall thickening or inflammatory change. Lymphatic: No pathologic adenopathy within the abdomen or pelvis. Reproductive: Status post hysterectomy. No adnexal masses. Other: Trace free fluid within the right upper quadrant, nonspecific. No free intraperitoneal gas. No abdominal wall hernia. Musculoskeletal: No acute or destructive bony abnormalities. Reconstructed images demonstrate no additional findings. Review of the MIP images confirms the above findings. IMPRESSION: Vascular 1. No evidence of thoracoabdominal aortic aneurysm or dissection. 2. No evidence of acute pulmonary  embolus. 3. Aortic Atherosclerosis (ICD10-I70.0). Coronary artery atherosclerosis. Nonvascular: 1. Diffuse interlobular septal thickening favoring interstitial edema. 2. Stable mediastinal and hilar lymphadenopathy, nonspecific. 3. Trace ascites right upper quadrant, nonspecific. 4. Gas within the bladder lumen, likely due to recent catheterization or instrumentation. Electronically Signed   By: Sharlet Salina M.D.   On: 07/13/2023 16:37   DG Chest 2 View Result Date: 07/13/2023 CLINICAL DATA:  Chest pain. EXAM: CHEST - 2 VIEW COMPARISON:  02/15/2023 FINDINGS: Stable mild cardiomegaly. Stable pulmonary vascular congestion. No evidence of acute infiltrate or pleural effusion. IMPRESSION: Stable mild cardiomegaly and pulmonary vascular congestion. No active infiltrate. Electronically Signed   By: Danae Orleans M.D.   On: 07/13/2023 14:25    Cardiac Studies     Patient Profile     62 y.o. female  wit hhx of cAD Admitted with CP    Assessment & Plan     CAD >  hx of CAD in the past .   On for cath today but her creatininine is 2.7.    Will ask IM service to work up her renal issues  Her troponinins are fairly low,  currently no having CP   Will postpone her cath until her renal function is better   She may have a heart healthy diet   2.  HLD : on repatha and zetia   3.  Hx of MR, s/p mitra clip          For questions or updates, please contact [facility] Please consult [email or web address] for contact info under        Signed, Kristeen Miss, MD  07/15/2023, 8:47 AM

## 2023-07-15 NOTE — Inpatient Diabetes Management (Signed)
Inpatient Diabetes Program Recommendations  AACE/ADA: New Consensus Statement on Inpatient Glycemic Control (2015)  Target Ranges:  Prepandial:   less than 140 mg/dL      Peak postprandial:   less than 180 mg/dL (1-2 hours)      Critically ill patients:  140 - 180 mg/dL   Lab Results  Component Value Date   GLUCAP 96 07/15/2023   HGBA1C 9.0 (H) 05/14/2023    Review of Glycemic Control  Latest Reference Range & Units 07/14/23 08:05 07/14/23 12:32 07/14/23 16:03 07/14/23 20:35 07/15/23 06:52 07/15/23 11:53 07/15/23 12:25  Glucose-Capillary 70 - 99 mg/dL 657 (H) 846 (H) 962 (H) 137 (H) 116 (H) 69 (L) 96   Diabetes history: DM 2 Outpatient Diabetes medications: Lantus 40 units Daily, Novolog 2-10 units tid  Current orders for Inpatient glycemic control:  Semglee 40 units qhs Novolog 0-15 units tid + hs  Inpatient Diabetes Program Recommendations:    Note: Hypoglycemia today no short acting insulin given only home basal insulin dose  -   Decrease Semglee to 35 units.  Thanks,  Christena Deem RN, MSN, BC-ADM Inpatient Diabetes Coordinator Team Pager [phone] (8a-5p)

## 2023-07-15 NOTE — Progress Notes (Signed)
Hypoglycemic Event  CBG: 69  Treatment: 8 oz juice/soda  Symptoms: None  Follow-up CBG: Time:1225 CBG Result: 96  Possible Reasons for Event: Patient was made NPO due to cath procedure.   Comments/MD notified:  NT notified RN that patient 1200 CBG is 69 and was NPO however patient now has a diet order. RN provided 2 x 8 oz orange juice. Repeat CBG is 96.  [name]

## 2023-07-15 NOTE — Progress Notes (Addendum)
Mobility Specialist Progress Note:    07/15/23 1229  Mobility  Activity Ambulated with assistance to bathroom  Level of Assistance Contact guard assist, steadying assist  Assistive Device Cane (IV Pole)  Distance Ambulated (ft) 10 ft  Activity Response Tolerated well  Mobility Referral Yes  Mobility visit 1 Mobility  Mobility Specialist Start Time (ACUTE ONLY) 1130  Mobility Specialist Stop Time (ACUTE ONLY) 1140  Mobility Specialist Time Calculation (min) (ACUTE ONLY) 10 min   Received pt in bed having no complaints and agreeable to mobility. Requesting to use the BR. Left seated EOB w/ call bell in reach and all needs met.   Thompson Grayer Mobility Specialist  Please contact vis Secure Chat or  Rehab Office [phone]

## 2023-07-15 NOTE — Progress Notes (Addendum)
PROGRESS NOTE Dana [name]  ZDG:[id number] DOB: May 04, 1961 DOA: 07/13/2023 PCP: Blane Ohara, MD  Brief Narrative/Hospital Course:  anxiety, chronic back pain, CKD 3B, OSA on CPAP, COPD, diastolic HF, HTN, HLD, gout, GERD, T1DM, and multivessel CAD/NSTEMI s/p multiple PCI's and complications including contrast-induced nephropathy who presents with chest pain  In ED, vitals stable afebrile, labs with creatinine 2.0 BUN 70 hemoglobin 9.9 g, troponin elevated at 21-31 flat, cardiology consulted and admitted for angina. EKG without ischemic changes.  Patient was placed in nitro drip with plan for cardiac cath on weekday.    Subjective: Seen and examined, Overnight vitals/labs/events reviewed  No chest pain family at bedside Npo for cath today Labs pending on ivf precath   Assessment and Plan: Principal Problem:   Angina pectoris (HCC) Active Problems:   Hyperglycemia  Angina CAD with multivessel disease, prior history of NSTEMI status post multiple PCI : Her chest pain was similar to her previous angina with relief from nitroglycerin.  Currently chest pain-free, continue on nitroglycerin drip,aspirin 81 Coreg 12.5, Zetia, Brilinta.  Plan was for cardiac cath today but with creatinine uptrending discussed with cardiology, holding off for now-until renal function is stable.   Klebsiella pneumonia UTI POA: on ceftriaxone> will switch to p.o.   Type 1 diabetes mellitus on insulin: A1c up to 9 2 months ago, PTA on 60 units Lantus w/ intermittent hypoglycemia cont on 40 u and ssi and carb modified diet.   Recent Labs  Lab 07/14/23 1603 07/14/23 2035 07/15/23 0652 07/15/23 1153 07/15/23 1225  GLUCAP 119* 137* 116* 69* 96      AKI on CKD 3B: Baseline creatinine fluctuating as below, on admission 2.1>1.8 and trending up.  Has had no hypotension or nephrotoxic agent. Torsemide on hold, received IV fluids precath- will cont ivf, check renal US urine electrolytes-if remains elevated will  consult nephrology given need for cardiac cath. Avoid nephrotoxic medications including NSAIDs and iodinated intravenous contrast exposure unless the latter is absolutely indicated.Preferred narcotic agents for pain control RJJ:[id number], fentanyl, and methadone and avoid morphine.Avoid Baclofen and avoid oral sodium phosphate and magnesium citrate based laxatives / bowel preps. Continue strict Input and Output monitoring and serial renal functions.  Recent Labs    02/18/23 0016 02/19/23 0308 02/20/23 0252 02/27/23 1418 04/04/23 1122 05/14/23 1123 07/12/23 1105 07/13/23 1325 07/13/23 1430 07/14/23 0512 07/15/23 1001  BUN 70* 56* 45* 57* 59* 60* 70* 67*  --  65* 69*  CREATININE 4.31* 2.94* 2.54* 2.18* 2.04* 2.23* 2.03* 1.83*  --  2.11* 2.76*  CO2 23 24 22 20 22 24 22 22   --  22 21*  K 4.3 4.3 4.4 4.3 4.5 4.5 4.4 4.3 4.2 3.9 4.5     HFpEF MR s/p MitraClip: Holding torsemide due to renal dysfunction.  Volume status stable. Cont her Coreg, compression stockings   HLD: Continue Zetia and Repatha   Chronic back pain  Continue home Norco and gabapentin  Normocytic anemia-likely from anemia of CKD: B/l hb 8 to 9 g range.  Stable.  Monitor   OSA: Continue CPAP treatment supplemental oxygen.  Obesity: Patient's Body mass index is 38.45 kg/m. : Will benefit with PCP follow-up, weight loss,healthy lifestyle   DVT prophylaxis: enoxaparin (LOVENOX) injection 40 mg Start: 07/13/23 1900 Code Status:   Code Status: Full Code Family Communication: plan of care discussed with patient and family at bedside. Patient status is: Remains hospitalized because of severity of illness Level of care: Telemetry Cardiac   Dispo: The patient  is from: home with family            Anticipated disposition: TBD Objective: Vitals last 24 hrs: Vitals:   07/14/23 2348 07/15/23 0529 07/15/23 0817 07/15/23 0952  BP: (!) 121/55 (!) 123/55 (!) 131/59 (!) 141/58  Pulse: 68 72 72 71  Resp: 18 17 18 16    Temp: 97.6 F (36.4 C) 98.1 F (36.7 C) 98.1 F (36.7 C)   TempSrc: Oral Oral Oral   SpO2: 98% 96% 95%   Weight:      Height:       Weight change: -0.2 kg  Physical Examination: General exam: alert awake, oriented , obese HEENT:Oral mucosa moist, Ear/Nose WNL grossly Respiratory system: Bilaterally clear BS,no use of accessory muscle Cardiovascular system: S1 & S2 +, No JVD. Gastrointestinal system: Abdomen soft,NT,ND, BS+ Nervous System: Alert, awake, moving all extremities,and following commands. Extremities: LE edema neg,distal peripheral pulses palpable and warm.  Skin: No rashes,no icterus. MSK: Normal muscle bulk,tone, power   Medications reviewed:  Scheduled Meds:  allopurinol  50 mg Oral Daily   aspirin EC  81 mg Oral Daily   busPIRone  10 mg Oral TID   carvedilol  12.5 mg Oral BID   cefadroxil  500 mg Oral Daily   enoxaparin (LOVENOX) injection  40 mg Subcutaneous Q24H   ezetimibe  10 mg Oral QPM   gabapentin  100 mg Oral BID   insulin aspart  0-15 Units Subcutaneous TID WC   insulin aspart  0-5 Units Subcutaneous QHS   insulin glargine-yfgn  40 Units Subcutaneous QHS   latanoprost  1 drop Both Eyes QHS   magnesium oxide  800 mg Oral Daily   omega-3 acid ethyl esters  1 g Oral Daily   pantoprazole  40 mg Oral Daily   ticagrelor  90 mg Oral BID   Continuous Infusions:  sodium chloride 1 mL/kg/hr (07/15/23 0606)   nitroGLYCERIN 10 mcg/min (07/13/23 1825)      Diet Order             Diet NPO time specified  Diet effective 0500                  Intake/Output Summary (Last 24 hours) at 07/15/2023 1009 Last data filed at 07/15/2023 0606 Gross per 24 hour  Intake 2484.95 ml  Output --  Net 2484.95 ml   Net IO Since Admission: 2,584.95 mL [07/15/23 1009]  Wt Readings from Last 3 Encounters:  07/14/23 104.8 kg  07/12/23 105.2 kg  05/14/23 104.7 kg     Unresulted Labs (From admission, onward)     Start     Ordered   07/16/23 0500  Basic  metabolic panel  Daily,   R      07/15/23 0943   07/15/23 0938  CBC  ONCE - STAT,   STAT        07/15/23 0937   07/15/23 0938  Basic metabolic panel  ONCE - STAT,   STAT        07/15/23 0937          Data Reviewed: I have personally reviewed following labs and imaging studies CBC: Recent Labs  Lab 07/12/23 1105 07/13/23 1325 07/13/23 1430 07/14/23 0512  WBC 11.0* 14.6*  --  10.0  NEUTROABS 8.0*  --   --   --   HGB 9.9* 10.1* 11.2* 9.3*  HCT 31.5* 32.1* 33.0* 29.6*  MCV 88 87.7  --  88.6  PLT 256 250  --  239   Basic Metabolic Panel:  Recent Labs  Lab 07/12/23 1105 07/13/23 1325 07/13/23 1430 07/14/23 0512  NA 139 134* 138 137  K 4.4 4.3 4.2 3.9  CL 100 101  --  106  CO2 22 22  --  22  GLUCOSE 162* 328*  --  123*  BUN 70* 67*  --  65*  CREATININE 2.03* 1.83*  --  2.11*  CALCIUM 9.2 8.7*  --  8.5*   GFR: Estimated Creatinine Clearance: 34.1 mL/min (A) (by C-G formula based on SCr of 2.11 mg/dL (H)). Liver Function Tests:  Recent Labs  Lab 07/12/23 1105  AST 21  ALT 25  ALKPHOS 226*  BILITOT 0.5  PROT 7.3  ALBUMIN 4.0   Recent Labs  Lab 07/14/23 0805 07/14/23 1232 07/14/23 1603 07/14/23 2035 07/15/23 0652  GLUCAP 112* 145* 119* 137* 116*   Recent Labs    07/12/23 1105  CHOL 92*  HDL 47  LDLCALC 28  TRIG 81  CHOLHDL 2.0   No results for input(s): "TSH", "T4TOTAL", "FREET4", "T3FREE", "THYROIDAB" in the last 72 hours. Sepsis Labs: No results for input(s): "PROCALCITON", "LATICACIDVEN" in the last 168 hours. Recent Results (from the past 240 hours)  Urine Culture     Status: Abnormal   Collection Time: 07/13/23  4:45 PM   Specimen: Urine, Random  Result Value Ref Range Status   Specimen Description URINE, RANDOM  Final   Special Requests   Final    NONE Reflexed from [id number] Performed at [facility], [address] [place], [place] [address]    Culture >=100,000 COLONIES/mL KLEBSIELLA PNEUMONIAE (A)  Final   Report Status  07/15/2023 FINAL  Final   Organism ID, Bacteria KLEBSIELLA PNEUMONIAE (A)  Final      Susceptibility   Klebsiella pneumoniae - MIC*    AMPICILLIN >=32 RESISTANT Resistant     CEFAZOLIN <=4 SENSITIVE Sensitive     CEFEPIME <=0.12 SENSITIVE Sensitive     CEFTRIAXONE <=0.25 SENSITIVE Sensitive     CIPROFLOXACIN <=0.25 SENSITIVE Sensitive     GENTAMICIN <=1 SENSITIVE Sensitive     IMIPENEM <=0.25 SENSITIVE Sensitive     NITROFURANTOIN 64 INTERMEDIATE Intermediate     TRIMETH/SULFA <=20 SENSITIVE Sensitive     AMPICILLIN/SULBACTAM 8 SENSITIVE Sensitive     PIP/TAZO <=4 SENSITIVE Sensitive ug/mL    * >=100,000 COLONIES/mL KLEBSIELLA PNEUMONIAE    Antimicrobials/Microbiology: Anti-infectives (From admission, onward)    Start     Dose/Rate Route Frequency Ordered Stop   07/15/23 1800  cefadroxil (DURICEF) capsule 500 mg        500 mg Oral Daily 07/15/23 1000 07/18/23 1759   07/14/23 1600  cefTRIAXone (ROCEPHIN) 1 g in sodium chloride 0.9 % 100 mL IVPB  Status:  Discontinued        1 g 200 mL/hr over 30 Minutes Intravenous Every 24 hours 07/13/23 2039 07/15/23 1000   07/13/23 1730  cefTRIAXone (ROCEPHIN) 1 g in sodium chloride 0.9 % 100 mL IVPB        1 g 200 mL/hr over 30 Minutes Intravenous  Once 07/13/23 1726 07/13/23 1916         Component Value Date/Time   SDES URINE, RANDOM 07/13/2023 1645   SPECREQUEST  07/13/2023 1645    NONE Reflexed from [id number] Performed at [facility], [address], [place], [place] [address]    CULT >=100,000 COLONIES/mL KLEBSIELLA PNEUMONIAE (A) 07/13/2023 1645   REPTSTATUS 07/15/2023 FINAL 07/13/2023 1645  Radiology Studies: CT Angio Chest/Abd/Pel for Dissection W and/or Wo Contrast Result Date: 07/13/2023 CLINICAL DATA:  Back and chest pain EXAM: CT ANGIOGRAPHY CHEST, ABDOMEN AND PELVIS TECHNIQUE: Non-contrast CT of the chest was initially obtained. Multidetector CT imaging through the chest, abdomen and pelvis was performed using the  standard protocol during bolus administration of intravenous contrast. Multiplanar reconstructed images and MIPs were obtained and reviewed to evaluate the vascular anatomy. RADIATION DOSE REDUCTION: This exam was performed according to the departmental dose-optimization program which includes automated exposure control, adjustment of the mA and/or kV according to patient size and/or use of iterative reconstruction technique. CONTRAST:  75mL OMNIPAQUE IOHEXOL 350 MG/ML SOLN COMPARISON:  08/29/2020, 07/13/2023 FINDINGS: CTA CHEST FINDINGS Cardiovascular: No evidence of thoracic aortic aneurysm or dissection. Borderline cardiomegaly without pericardial effusion. Post therapeutic changes from mitral valve repair. Diffuse atherosclerosis of the coronary vasculature. Chronic retained 1.3 cm metallic object within the left lower lobe pulmonary artery, stable since the 08/29/2020 exam, likely pulmonary arterial pressure monitor. No other filling defects or pulmonary emboli. Mediastinum/Nodes: Persistent borderline enlarged mediastinal and hilar lymph nodes, not significantly changed since prior exam. Index lymph node in the pretracheal region image 26/6 measures 14 mm in short axis, previously 12 mm. Thyroid, trachea, and esophagus are unremarkable. Lungs/Pleura: The diffuse interlobular septal thickening, greatest at the bases, consistent with mild interstitial edema. Scattered hypoventilatory changes at the lung bases. No airspace disease, effusion, or pneumothorax. Central airways are patent. Musculoskeletal: No acute or destructive bony abnormalities. Prior healed bilateral rib fractures. Reconstructed images demonstrate no additional findings. Review of the MIP images confirms the above findings. CTA ABDOMEN AND PELVIS FINDINGS VASCULAR Aorta: Normal caliber aorta without aneurysm, dissection, vasculitis or significant stenosis. Mild atherosclerosis. Celiac: Patent without evidence of aneurysm, dissection, vasculitis  or significant stenosis. Diffuse atherosclerosis. SMA: Patent without evidence of aneurysm, dissection, vasculitis or significant stenosis. Diffuse atherosclerosis. Renals: Accessory right renal artery is noted. The bilateral renal arteries are patent without evidence of aneurysm, dissection, vasculitis, fibromuscular dysplasia or significant stenosis. IMA: Patent without evidence of aneurysm, dissection, vasculitis or significant stenosis. Inflow: Patent without evidence of aneurysm, dissection, vasculitis or significant stenosis. Diffuse atherosclerosis. Veins: No obvious venous abnormality within the limitations of this arterial phase study. Review of the MIP images confirms the above findings. NON-VASCULAR Hepatobiliary: No focal liver abnormality is seen. Status post cholecystectomy. No biliary dilatation. Pancreas: Unremarkable. No pancreatic ductal dilatation or surrounding inflammatory changes. Spleen: Normal in size without focal abnormality. Adrenals/Urinary Tract: Adrenal glands are unremarkable. Kidneys are normal, without renal calculi, focal lesion, or hydronephrosis. There is gas within the bladder lumen, please correlate with any recent instrumentation or catheterization. Stomach/Bowel: No bowel obstruction or ileus. No bowel wall thickening or inflammatory change. Lymphatic: No pathologic adenopathy within the abdomen or pelvis. Reproductive: Status post hysterectomy. No adnexal masses. Other: Trace free fluid within the right upper quadrant, nonspecific. No free intraperitoneal gas. No abdominal wall hernia. Musculoskeletal: No acute or destructive bony abnormalities. Reconstructed images demonstrate no additional findings. Review of the MIP images confirms the above findings. IMPRESSION: Vascular 1. No evidence of thoracoabdominal aortic aneurysm or dissection. 2. No evidence of acute pulmonary embolus. 3. Aortic Atherosclerosis (ICD10-I70.0). Coronary artery atherosclerosis. Nonvascular: 1.  Diffuse interlobular septal thickening favoring interstitial edema. 2. Stable mediastinal and hilar lymphadenopathy, nonspecific. 3. Trace ascites right upper quadrant, nonspecific. 4. Gas within the bladder lumen, likely due to recent catheterization or instrumentation. Electronically Signed   By: Sharlet Salina M.D.   On: 07/13/2023  16:37   DG Chest 2 View Result Date: 07/13/2023 CLINICAL DATA:  Chest pain. EXAM: CHEST - 2 VIEW COMPARISON:  02/15/2023 FINDINGS: Stable mild cardiomegaly. Stable pulmonary vascular congestion. No evidence of acute infiltrate or pleural effusion. IMPRESSION: Stable mild cardiomegaly and pulmonary vascular congestion. No active infiltrate. Electronically Signed   By: Danae Orleans M.D.   On: 07/13/2023 14:25    LOS: 2 days   Total time spent in review of labs and imaging, patient evaluation, formulation of plan, documentation and communication with family: 35 minutes  Lanae Boast, MD [facility]  07/15/2023, 10:09 AM

## 2023-07-15 NOTE — Progress Notes (Signed)
*  PRELIMINARY RESULTS* Echocardiogram 2D Echocardiogram has been performed.  Dana [name] 07/15/2023, 3:26 PM

## 2023-07-16 ENCOUNTER — Encounter (HOSPITAL_COMMUNITY)
Admission: EM | Disposition: A | Discharge status: Home / Self Care | Payer: Self-pay | Source: Home / Self Care | Attending: Internal Medicine

## 2023-07-16 DIAGNOSIS — I25110 Atherosclerotic heart disease of native coronary artery with unstable angina pectoris: Secondary | ICD-10-CM | POA: Diagnosis not present

## 2023-07-16 DIAGNOSIS — I209 Angina pectoris, unspecified: Secondary | ICD-10-CM | POA: Diagnosis not present

## 2023-07-16 HISTORY — PX: LEFT HEART CATH AND CORONARY ANGIOGRAPHY: CATH118249

## 2023-07-16 LAB — GLUCOSE, CAPILLARY
Glucose-Capillary: 39 mg/dL — CL (ref 70–99)
Glucose-Capillary: 68 mg/dL — ABNORMAL LOW (ref 70–99)
Glucose-Capillary: 106 mg/dL — ABNORMAL HIGH (ref 70–99)
Glucose-Capillary: 61 mg/dL — ABNORMAL LOW (ref 70–99)
Glucose-Capillary: 147 mg/dL — ABNORMAL HIGH (ref 70–99)
Glucose-Capillary: 137 mg/dL — ABNORMAL HIGH (ref 70–99)
Glucose-Capillary: 262 mg/dL — ABNORMAL HIGH (ref 70–99)

## 2023-07-16 LAB — BASIC METABOLIC PANEL
Anion gap: 9 (ref 5–15)
BUN: 68 mg/dL — ABNORMAL HIGH (ref 6–20)
CO2: 19 mmol/L — ABNORMAL LOW (ref 22–32)
Calcium: 8.4 mg/dL — ABNORMAL LOW (ref 8.9–10.3)
Chloride: 110 mmol/L (ref 98–111)
Creatinine, Ser: 2.65 mg/dL — ABNORMAL HIGH (ref 0.44–1.00)
GFR, Estimated: 20 mL/min — ABNORMAL LOW (ref >60)
Glucose, Bld: 73 mg/dL (ref 70–99)
Potassium: 4.7 mmol/L (ref 3.5–5.1)
Sodium: 138 mmol/L (ref 135–145)

## 2023-07-16 SURGERY — LEFT HEART CATH AND CORONARY ANGIOGRAPHY
Anesthesia: LOCAL

## 2023-07-16 MED ORDER — SODIUM CHLORIDE 0.9 % WEIGHT BASED INFUSION: 1.0000 mL/kg/h | INTRAVENOUS | Status: DC

## 2023-07-16 MED ORDER — DEXTROSE-SODIUM CHLORIDE 5-0.9 % IV SOLN: INTRAVENOUS | Status: DC

## 2023-07-16 MED ORDER — SODIUM CHLORIDE 0.9 % WEIGHT BASED INFUSION: 3.0000 mL/kg/h | INTRAVENOUS | Status: DC

## 2023-07-16 MED ORDER — SODIUM CHLORIDE 0.9% FLUSH
3.0000 mL | INTRAVENOUS | Status: DC | PRN
Start: 2023-07-16 — End: 2023-07-16

## 2023-07-16 MED ORDER — FENTANYL CITRATE (PF) 100 MCG/2ML IJ SOLN
INTRAMUSCULAR | Status: DC | PRN
  Administered 2023-07-16: 25 ug via INTRAVENOUS

## 2023-07-16 MED ORDER — LIDOCAINE HCL (PF) 1 % IJ SOLN
INTRAMUSCULAR | Status: AC
  Filled 2023-07-16: qty 30

## 2023-07-16 MED ORDER — ASPIRIN 81 MG PO CHEW: 81.0000 mg | CHEWABLE_TABLET | ORAL | Status: AC

## 2023-07-16 MED ORDER — SODIUM CHLORIDE 0.9 % IV SOLN: 250.0000 mL | INTRAVENOUS | Status: DC | PRN

## 2023-07-16 MED ORDER — VERAPAMIL HCL 2.5 MG/ML IV SOLN
INTRAVENOUS | Status: AC
  Filled 2023-07-16: qty 2

## 2023-07-16 MED ORDER — SODIUM CHLORIDE 0.9% FLUSH: 3.0000 mL | INTRAVENOUS | Status: DC | PRN

## 2023-07-16 MED ORDER — DEXTROSE 50 % IV SOLN
1.0000 | Freq: Once | INTRAVENOUS | Status: AC
  Administered 2023-07-16: 50 mL via INTRAVENOUS

## 2023-07-16 MED ORDER — LIDOCAINE HCL (PF) 1 % IJ SOLN
INTRAMUSCULAR | Status: DC | PRN
  Administered 2023-07-16: 15 mL

## 2023-07-16 MED ORDER — DEXTROSE 50 % IV SOLN
INTRAVENOUS | Status: AC
  Filled 2023-07-16: qty 50

## 2023-07-16 MED ORDER — ENOXAPARIN SODIUM 40 MG/0.4ML IJ SOSY
40.0000 mg | PREFILLED_SYRINGE | INTRAMUSCULAR | Status: DC
  Administered 2023-07-17 – 2023-07-20 (×3): 40 mg via SUBCUTANEOUS
  Filled 2023-07-16 (×4): qty 0.4

## 2023-07-16 MED ORDER — MIDAZOLAM HCL 2 MG/2ML IJ SOLN
INTRAMUSCULAR | Status: AC
  Filled 2023-07-16: qty 2

## 2023-07-16 MED ORDER — INSULIN ASPART 100 UNIT/ML IJ SOLN
0.0000 [IU] | Freq: Three times a day (TID) | INTRAMUSCULAR | Status: DC
Start: 2023-07-16 — End: 2023-07-17

## 2023-07-16 MED ORDER — HEPARIN (PORCINE) IN NACL 1000-0.9 UT/500ML-% IV SOLN
INTRAVENOUS | Status: DC | PRN
  Administered 2023-07-16 (×2): 500 mL

## 2023-07-16 MED ORDER — IOHEXOL 350 MG/ML SOLN
INTRAVENOUS | Status: DC | PRN
  Administered 2023-07-16: 24 mL

## 2023-07-16 MED ORDER — FENTANYL CITRATE (PF) 100 MCG/2ML IJ SOLN
INTRAMUSCULAR | Status: AC
  Filled 2023-07-16: qty 2

## 2023-07-16 MED ORDER — LABETALOL HCL 5 MG/ML IV SOLN: 10.0000 mg | INTRAVENOUS | Status: AC | PRN

## 2023-07-16 MED ORDER — SODIUM CHLORIDE 0.9% FLUSH
3.0000 mL | Freq: Two times a day (BID) | INTRAVENOUS | Status: DC
  Administered 2023-07-17 – 2023-07-18 (×3): 3 mL via INTRAVENOUS

## 2023-07-16 MED ORDER — SODIUM CHLORIDE 0.9 % IV SOLN: INTRAVENOUS | Status: AC

## 2023-07-16 MED ORDER — SODIUM CHLORIDE 0.9 % IV SOLN: 250.0000 mL | INTRAVENOUS | Status: AC | PRN

## 2023-07-16 MED ORDER — SODIUM CHLORIDE 0.9% FLUSH
3.0000 mL | Freq: Two times a day (BID) | INTRAVENOUS | Status: DC
  Administered 2023-07-16: 3 mL via INTRAVENOUS

## 2023-07-16 MED ORDER — MIDAZOLAM HCL 2 MG/2ML IJ SOLN
INTRAMUSCULAR | Status: DC | PRN
  Administered 2023-07-16: 1 mg via INTRAVENOUS

## 2023-07-16 MED ORDER — HYDRALAZINE HCL 20 MG/ML IJ SOLN: 10.0000 mg | INTRAMUSCULAR | Status: AC | PRN

## 2023-07-16 MED ORDER — INSULIN GLARGINE-YFGN 100 UNIT/ML ~~LOC~~ SOLN
18.0000 [IU] | Freq: Every day | SUBCUTANEOUS | Status: DC
  Filled 2023-07-16: qty 0.18

## 2023-07-16 SURGICAL SUPPLY — 11 items
CATH INFINITI 5FR MULTPACK ANG (CATHETERS) IMPLANT
CLOSURE MYNX CONTROL 5F (Vascular Products) IMPLANT
ELECT DEFIB PAD ADLT CADENCE (PAD) IMPLANT
KIT MICROPUNCTURE NIT STIFF (SHEATH) IMPLANT
MAT PREVALON FULL STRYKER (MISCELLANEOUS) IMPLANT
PACK CARDIAC CATHETERIZATION (CUSTOM PROCEDURE TRAY) ×1 IMPLANT
SET ATX-X65L (MISCELLANEOUS) IMPLANT
SHEATH PINNACLE 5F 10CM (SHEATH) IMPLANT
SHEATH PROBE COVER 6X72 (BAG) IMPLANT
WIRE EMERALD 3MM-J .035X150CM (WIRE) IMPLANT
WIRE MICROINTRODUCER 60CM (WIRE) IMPLANT

## 2023-07-16 NOTE — H&P (View-Only) (Signed)
 Rounding Note    Patient Name: Dana [name] Date of Encounter: 07/16/2023  [facility] Cardiologist: Toribio Fuel, MD   Subjective   62 yo with hx of CAD ,  admitted with angina   For cath today ( femoral access)  Has no questions about upcoming cath   Cath was postponed yesterday due to rise I creatinine  Nephrology is evaluating today     Will repeat echo to make sure her reduce renal function is not due to CHF      Inpatient Medications    Scheduled Meds:  allopurinol   50 mg Oral Daily   aspirin  EC  81 mg Oral Daily   busPIRone   10 mg Oral TID   carvedilol   12.5 mg Oral BID   cefadroxil   500 mg Oral Daily   enoxaparin  (LOVENOX ) injection  40 mg Subcutaneous Q24H   ezetimibe   10 mg Oral QPM   gabapentin   100 mg Oral BID   insulin  aspart  0-9 Units Subcutaneous TID WC   insulin  glargine-yfgn  18 Units Subcutaneous QHS   latanoprost   1 drop Both Eyes QHS   magnesium  oxide  800 mg Oral Daily   omega-3 acid ethyl esters  1 g Oral Daily   pantoprazole   40 mg Oral Daily   ticagrelor   90 mg Oral BID   Continuous Infusions:  dextrose  5 % and 0.9 % NaCl     nitroGLYCERIN  10 mcg/min (07/13/23 1825)   PRN Meds: acetaminophen , HYDROcodone -acetaminophen , hydrOXYzine , linaclotide , ondansetron  (ZOFRAN ) IV **OR** ondansetron    Vital Signs    Vitals:   07/16/23 0000 07/16/23 0100 07/16/23 0401 07/16/23 0504  BP: (!) 162/77 (!) 161/76 104/86   Pulse: 79 75 72   Resp:  18 18   Temp:  98.8 F (37.1 C) 98.5 F (36.9 C)   TempSrc:  Oral Oral   SpO2: 95% 96% 97%   Weight:    106.5 kg  Height:        Intake/Output Summary (Last 24 hours) at 07/16/2023 0954 Last data filed at 07/16/2023 9297 Gross per 24 hour  Intake 843.67 ml  Output --  Net 843.67 ml      07/16/2023    5:04 AM 07/14/2023    4:00 PM 07/13/2023    8:33 PM  Last 3 Weights  Weight (lbs) 234 lb 12.6 oz 231 lb 0.7 oz 231 lb 7.7 oz  Weight (kg) 106.5 kg 104.8 kg 105 kg       Telemetry    NSR   NSR - Personally Reviewed  ECG     - Personally Reviewed  Physical Exam   Physical Exam: Blood pressure 104/86, pulse 72, temperature 98.5 F (36.9 C), temperature source Oral, resp. rate 18, height 5' 5 (1.651 m), weight 106.5 kg, SpO2 97%.       GEN:  mildly obese female,  HEENT: Normal NECK: No JVD; No carotid bruits LYMPHATICS: No lymphadenopathy CARDIAC: RRR , no murmurs, rubs, gallops RESPIRATORY:  few rales in bases  ABDOMEN: Soft, non-tender, non-distended MUSCULOSKELETAL:  No edema; No deformity  SKIN: Warm and dry NEUROLOGIC:  Alert and oriented x 3   Labs    High Sensitivity Troponin:   Recent Labs  Lab 07/13/23 1325 07/13/23 1505  TROPONINIHS 21* 31*     Chemistry Recent Labs  Lab 07/12/23 1105 07/13/23 1325 07/14/23 0512 07/15/23 1001 07/16/23 0248  NA 139   < > 137 138 138  K 4.4   < >  3.9 4.5 4.7  CL 100   < > 106 110 110  CO2 22   < > 22 21* 19*  GLUCOSE 162*   < > 123* 72 73  BUN 70*   < > 65* 69* 68*  CREATININE 2.03*   < > 2.11* 2.76* 2.65*  CALCIUM  9.2   < > 8.5* 8.7* 8.4*  PROT 7.3  --   --   --   --   ALBUMIN  4.0  --   --   --   --   AST 21  --   --   --   --   ALT 25  --   --   --   --   ALKPHOS 226*  --   --   --   --   BILITOT 0.5  --   --   --   --   GFRNONAA  --    < > 26* 19* 20*  ANIONGAP  --    < > 9 7 9    < > = values in this interval not displayed.    Lipids  Recent Labs  Lab 07/12/23 1105  CHOL 92*  TRIG 81  HDL 47  LABVLDL 17  LDLCALC 28  CHOLHDL 2.0    Hematology Recent Labs  Lab 07/13/23 1325 07/13/23 1430 07/14/23 0512 07/15/23 1001  WBC 14.6*  --  10.0 9.9  RBC 3.66*  --  3.34* 3.31*  HGB 10.1* 11.2* 9.3* 9.2*  HCT 32.1* 33.0* 29.6* 29.5*  MCV 87.7  --  88.6 89.1  MCH 27.6  --  27.8 27.8  MCHC 31.5  --  31.4 31.2  RDW 14.4  --  14.4 14.6  PLT 250  --  239 237   Thyroid  No results for input(s): TSH, FREET4 in the last 168 hours.  BNP Recent Labs  Lab  07/13/23 1325  BNP 212.4*    DDimer No results for input(s): DDIMER in the last 168 hours.   Radiology    US  RENAL Result Date: 07/15/2023 CLINICAL DATA:  Acute kidney injury. EXAM: RENAL / URINARY TRACT ULTRASOUND COMPLETE COMPARISON:  None Available. FINDINGS: Right Kidney: Renal measurements: 11.5 cm x 3.7 cm x 4.7 cm = volume: 102.65 mL. Diffusely increased echogenicity of the renal parenchyma is noted. No mass or hydronephrosis visualized. Left Kidney: Renal measurements: 11.1 cm x 4.7 cm x 4.7 cm = volume: 128.04 mL. Diffusely increased echogenicity of the renal parenchyma is noted. No mass or hydronephrosis visualized. Bladder: Appears normal for degree of bladder distention. Other: None. IMPRESSION: Bilateral echogenic kidneys which may represent sequelae associated with medical renal disease. Electronically Signed   By: Suzen Dials M.D.   On: 07/15/2023 21:49   ECHOCARDIOGRAM LIMITED Result Date: 07/15/2023    ECHOCARDIOGRAM LIMITED REPORT   Patient Name:   Dana [name] Date of Exam: 07/15/2023 Medical Rec #:  [id number]      Height:       65.0 in Accession #:    [id number]     Weight:       231.0 lb Date of Birth:  Oct 03, 1960      BSA:          2.103 m Patient Age:    62 years       BP:           141/58 mmHg Patient Gender: F              HR:  71 bpm. Exam Location:  Inpatient Procedure: Limited Echo, Intracardiac Opacification Agent, Limited Color Doppler            and Cardiac Doppler Indications:     CHF  History:         Patient has prior history of Echocardiogram examinations, most                  recent 12/09/2022. CHF, CAD and Previous Myocardial Infarction,                  Mitral Valve Disease; Risk Factors:Dyslipidemia and Former                  Smoker.                   Mitral Valve: Mitra-Clip valve is present in the mitral                  position.  Sonographer:     Tillman Nora RVT RCS Referring Phys:  ALEENE JINNY PASSE Diagnosing Phys: Annabella Scarce MD  IMPRESSIONS  1. Left ventricular ejection fraction, by estimation, is 55 to 60%. The left ventricle has normal function. The left ventricle has no regional wall motion abnormalities. There is mild left ventricular hypertrophy of the lateral segment. Left ventricular  diastolic function could not be evaluated. There is the interventricular septum is flattened in systole, consistent with right ventricular pressure overload.  2. Right ventricular systolic function is normal. The right ventricular size is normal. There is normal pulmonary artery systolic pressure.  3. Left atrial size was moderately dilated.  4. Mitral valve gradient unchanged from echo 11/2022. The mitral valve has been repaired/replaced. Mild mitral valve regurgitation. No evidence of mitral stenosis. The mean mitral valve gradient is 5.0 mmHg. There is a Mitra-Clip present in the mitral position.  5. The aortic valve is normal in structure. Aortic valve regurgitation is not visualized. No aortic stenosis is present.  6. The inferior vena cava is dilated in size with <50% respiratory variability, suggesting right atrial pressure of 15 mmHg. FINDINGS  Left Ventricle: Left ventricular ejection fraction, by estimation, is 55 to 60%. The left ventricle has normal function. The left ventricle has no regional wall motion abnormalities. Definity  contrast agent was given IV to delineate the left ventricular  endocardial borders. The left ventricular internal cavity size was normal in size. There is mild left ventricular hypertrophy of the lateral segment. The interventricular septum is flattened in systole, consistent with right ventricular pressure overload. Left ventricular diastolic function could not be evaluated. Left ventricular diastolic function could not be evaluated due to mitral valve repair. Right Ventricle: The right ventricular size is normal. No increase in right ventricular wall thickness. Right ventricular systolic function is normal. There is  normal pulmonary artery systolic pressure. The tricuspid regurgitant velocity is 1.61 m/s, and  with an assumed right atrial pressure of 15 mmHg, the estimated right ventricular systolic pressure is 25.4 mmHg. Left Atrium: Left atrial size was moderately dilated. Right Atrium: Right atrial size was normal in size. Pericardium: There is no evidence of pericardial effusion. Mitral Valve: Mitral valve gradient unchanged from echo 11/2022. The mitral valve has been repaired/replaced. Mild mitral valve regurgitation. There is a Mitra-Clip present in the mitral position. No evidence of mitral valve stenosis. MV peak gradient, 18.7 mmHg. The mean mitral valve gradient is 5.0 mmHg with average heart rate of 71 bpm. Tricuspid Valve: The tricuspid valve is normal in  structure. Tricuspid valve regurgitation is trivial. No evidence of tricuspid stenosis. Aortic Valve: The aortic valve is normal in structure. Aortic valve regurgitation is not visualized. No aortic stenosis is present. Pulmonic Valve: The pulmonic valve was normal in structure. Pulmonic valve regurgitation is not visualized. No evidence of pulmonic stenosis. Aorta: The aortic root is normal in size and structure. Venous: The inferior vena cava is dilated in size with less than 50% respiratory variability, suggesting right atrial pressure of 15 mmHg. IAS/Shunts: No atrial level shunt detected by color flow Doppler. Additional Comments: Spectral Doppler performed. Color Doppler performed.  LEFT VENTRICLE PLAX 2D LVIDd:         5.00 cm LVIDs:         3.40 cm LV PW:         1.20 cm LV IVS:        1.00 cm LVOT diam:     1.70 cm LVOT Area:     2.27 cm  IVC IVC diam: 2.30 cm LEFT ATRIUM             Index LA diam:        3.20 cm 1.52 cm/m LA Vol (A2C):   85.8 ml 40.79 ml/m LA Vol (A4C):   63.2 ml 30.05 ml/m LA Biplane Vol: 79.3 ml 37.70 ml/m                        PULMONIC VALVE AORTA                 PV Vmax:       0.92 m/s Ao Root diam: 2.70 cm PV Peak grad:  3.4  mmHg Ao Asc diam:  3.40 cm  MITRAL VALVE                TRICUSPID VALVE MV Area (PHT): 3.72 cm     TR Peak grad:   10.4 mmHg MV Peak grad:  18.7 mmHg    TR Vmax:        161.00 cm/s MV Mean grad:  5.0 mmHg MV Vmax:       2.16 m/s     SHUNTS MV Vmean:      97.4 cm/s    Systemic Diam: 1.70 cm MV Decel Time: 204 msec MV E velocity: 221.00 cm/s MV A velocity: 101.00 cm/s MV E/A ratio:  2.19 Annabella Scarce MD Electronically signed by Annabella Scarce MD Signature Date/Time: 07/15/2023/6:24:49 PM    Final (Updated)     Cardiac Studies     Patient Profile     62 y.o. female  wit hhx of cAD Admitted with CP    Assessment & Plan     CAD >  hx of CAD in the past .   Creatinine is not significantly better from yesterday .  Is being evaluated by nephrology   Her LV systolic function remains normal   Waiting to hear from Dr. Gearline of Nephrology before we schedule her cath .   2.  HLD : on repatha  and zetia    3.  Hx of MR, s/p mitra clip          For questions or updates, please contact [facility] Please consult [email or web address] for contact info under        Signed, Aleene Passe, MD  07/16/2023, 9:54 AM

## 2023-07-16 NOTE — Consult Note (Signed)
 [facility]  HISTORY AND PHYSICAL  Dana [name] is an 62 y.o. female.    Chief Complaint: chest pain  HPI: Pt is a 3F with a PMH sig for HTN, HLD, CAD s/p multiple PCIs, and h/o contrast-induced nephropathy who is now seen in consultation for pre- cath eval.    Pt was admitted 07/13/23 with CP.  Had CTA with contrast.  Cr 1.8 on admission, up to 2.76 07/15/23--> 2.65 07/16/23.  Chest pain free but still on nitroglycerin  gtt.    Has had multiple admissions, most recently August for CP and had PCI.  Cr peaked at around 5, didn't have to start dialysis.  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    Spoke to pt and dtr by phone re: risks/ benefits of cathing.  They are under no illusions that each cath could result in permanent renal injury- but they also understand that not treating CAD is also not advisable.  Torsemide  has been held and pre-cath fluids have been started.    PMH: Past Medical History:  Diagnosis Date   Acquired absence of left great toe (HCC)    Acquired absence of other left toe(s) (HCC)    ACS (acute coronary syndrome) (HCC)    Anemia    Anoxic brain injury (HCC)    Anxiety    CAD (coronary artery disease)    DES mLAD 04/07/15; DES dRCA & DES pPDA 08/30/16; DES mCX & PTCA OM1 09/29/20   Chronic diastolic (congestive) heart failure (HCC)    Chronic pain    Chronic systolic (congestive) heart failure (HCC)    CKD (chronic kidney disease)    Contrast dye induced nephropathy, possible 09/03/2020   COPD (chronic obstructive pulmonary disease) (HCC)    COVID-19 virus infection 03/03/2023   Diabetes (HCC)type 1    Diastolic CHF (HCC)    Dyspnea    Family history of breast cancer 10/23/2021   Gastro-esophageal reflux disease without esophagitis    Hyperlipidemia    Hypertension    Hypertensive heart disease with heart failure (HCC)    Hypoxemia    Idiopathic gout, multiple sites    Leukocytosis 03/29/2022   Localized edema    Low back pain     Mitral regurgitation    Mitral regurgitation    Mixed hyperlipidemia    Mixed incontinence    Morbid (severe) obesity due to excess calories (HCC)    Neuromuscular disorder (HCC)    Neuropathy    Non-ST elevation (NSTEMI) myocardial infarction (HCC)    08/29/20, 09/29/20   OSA (obstructive sleep apnea) 09/03/2020   Other specified hypothyroidism    PAF (paroxysmal atrial fibrillation) (HCC)    s/p pulmonary vein isolation by cryoablation 10/04/15 Dr. Meldon in [facility]   PEA (Pulseless electrical activity) [facility])    ~ 01/13/21 at [facility] during admission DKA, volume overload, possible CAP, hypoxia s/p ACLS with ROSC ~ 10-15   Pneumonia    Primary insomnia    Rheumatoid arthritis (HCC)    Stroke (HCC)    Thyroid  disease    Type 1 diabetes mellitus (HCC)    Vitamin D  deficiency    PSH: Past Surgical History:  Procedure Laterality Date   ABDOMINAL HYSTERECTOMY     Partial- Still has both ovaries   CARDIOVASCULAR STRESS TEST  08/13/2014   Nuclear; Normal   CARPAL TUNNEL RELEASE     CATARACT EXTRACTION, BILATERAL     CESAREAN SECTION     CHOLECYSTECTOMY  CORONARY ANGIOPLASTY WITH STENT PLACEMENT     3 blockages/ 1 stent   CORONARY PRESSURE/FFR STUDY N/A 09/07/2022   Procedure: INTRAVASCULAR PRESSURE WIRE/FFR STUDY;  Surgeon: Jordan, Peter M, MD;  Location: [facility] INVASIVE CV LAB;  Service: Cardiovascular;  Laterality: N/A;   CORONARY STENT INTERVENTION N/A 09/29/2020   Procedure: CORONARY STENT INTERVENTION;  Surgeon: Wonda Sharper, MD;  Location: [facility] INVASIVE CV LAB;  Service: Cardiovascular;  Laterality: N/A;  CFX   CORONARY STENT INTERVENTION N/A 09/07/2022   Procedure: CORONARY STENT INTERVENTION;  Surgeon: Jordan, Peter M, MD;  Location: [facility] INVASIVE CV LAB;  Service: Cardiovascular;  Laterality: N/A;   CORONARY STENT INTERVENTION N/A 02/13/2023   Procedure: CORONARY STENT INTERVENTION;  Surgeon: Verlin Lonni BIRCH, MD;  Location: [facility] INVASIVE CV LAB;  Service:  Cardiovascular;  Laterality: N/A;  LAD   CORONARY ULTRASOUND/IVUS N/A 02/13/2023   Procedure: Coronary Ultrasound/IVUS;  Surgeon: Verlin Lonni BIRCH, MD;  Location: [facility] INVASIVE CV LAB;  Service: Cardiovascular;  Laterality: N/A;   CORONARY/GRAFT ACUTE MI REVASCULARIZATION N/A 12/08/2022   Procedure: Coronary/Graft Acute MI Revascularization;  Surgeon: Anner Alm ORN, MD;  Location: [facility] INVASIVE CV LAB;  Service: Cardiovascular;  Laterality: N/A;   EYE SURGERY     LEFT HEART CATH AND CORONARY ANGIOGRAPHY N/A 09/29/2020   Procedure: LEFT HEART CATH AND CORONARY ANGIOGRAPHY;  Surgeon: Wonda Sharper, MD;  Location: [facility] INVASIVE CV LAB;  Service: Cardiovascular;  Laterality: N/A;   LEFT HEART CATH AND CORONARY ANGIOGRAPHY N/A 09/07/2022   Procedure: LEFT HEART CATH AND CORONARY ANGIOGRAPHY;  Surgeon: Jordan, Peter M, MD;  Location: [facility] INVASIVE CV LAB;  Service: Cardiovascular;  Laterality: N/A;   LEFT HEART CATH AND CORONARY ANGIOGRAPHY N/A 12/08/2022   Procedure: LEFT HEART CATH AND CORONARY ANGIOGRAPHY;  Surgeon: Anner Alm ORN, MD;  Location: [facility] INVASIVE CV LAB;  Service: Cardiovascular;  Laterality: N/A;   LEFT HEART CATH AND CORONARY ANGIOGRAPHY N/A 02/13/2023   Procedure: LEFT HEART CATH AND CORONARY ANGIOGRAPHY;  Surgeon: Verlin Lonni BIRCH, MD;  Location: [facility] INVASIVE CV LAB;  Service: Cardiovascular;  Laterality: N/A;   RIGHT HEART CATH N/A 04/27/2021   Procedure: RIGHT HEART CATH;  Surgeon: Cherrie Toribio SAUNDERS, MD;  Location: [facility] INVASIVE CV LAB;  Service: Cardiovascular;  Laterality: N/A;   RIGHT/LEFT HEART CATH AND CORONARY ANGIOGRAPHY N/A 01/07/2017   Procedure: Right/Left Heart Cath and Coronary Angiography;  Surgeon: Rolan Ezra RAMAN, MD;  Location: [facility] INVASIVE CV LAB;  Service: Cardiovascular;  Laterality: N/A;   ROTATOR CUFF REPAIR     TEE WITHOUT CARDIOVERSION N/A 04/28/2021   Procedure: TRANSESOPHAGEAL ECHOCARDIOGRAM (TEE);  Surgeon: Cherrie Toribio SAUNDERS, MD;  Location: [facility]  ENDOSCOPY;  Service: Cardiovascular;  Laterality: N/A;   TEE WITHOUT CARDIOVERSION N/A 05/18/2021   Procedure: TRANSESOPHAGEAL ECHOCARDIOGRAM (TEE);  Surgeon: Thukkani, Arun K, MD;  Location: [facility] INVASIVE CV LAB;  Service: Cardiovascular;  Laterality: N/A;   TOE AMPUTATION Left    TRANSCATHETER MITRAL EDGE TO EDGE REPAIR N/A 05/18/2021   Procedure: MITRAL VALVE REPAIR;  Surgeon: Wendel Lurena POUR, MD;  Location: [facility] INVASIVE CV LAB;  Service: Cardiovascular;  Laterality: N/A;   US  ECHOCARDIOGRAPHY  05/2017   Normal     Past Medical History:  Diagnosis Date   Acquired absence of left great toe (HCC)    Acquired absence of other left toe(s) (HCC)    ACS (acute coronary syndrome) (HCC)    Anemia    Anoxic brain injury (HCC)    Anxiety    CAD (coronary artery  disease)    DES mLAD 04/07/15; DES dRCA & DES pPDA 08/30/16; DES mCX & PTCA OM1 09/29/20   Chronic diastolic (congestive) heart failure (HCC)    Chronic pain    Chronic systolic (congestive) heart failure (HCC)    CKD (chronic kidney disease)    Contrast dye induced nephropathy, possible 09/03/2020   COPD (chronic obstructive pulmonary disease) (HCC)    COVID-19 virus infection 03/03/2023   Diabetes (HCC)type 1    Diastolic CHF (HCC)    Dyspnea    Family history of breast cancer 10/23/2021   Gastro-esophageal reflux disease without esophagitis    Hyperlipidemia    Hypertension    Hypertensive heart disease with heart failure (HCC)    Hypoxemia    Idiopathic gout, multiple sites    Leukocytosis 03/29/2022   Localized edema    Low back pain    Mitral regurgitation    Mitral regurgitation    Mixed hyperlipidemia    Mixed incontinence    Morbid (severe) obesity due to excess calories (HCC)    Neuromuscular disorder (HCC)    Neuropathy    Non-ST elevation (NSTEMI) myocardial infarction (HCC)    08/29/20, 09/29/20   OSA (obstructive sleep apnea) 09/03/2020   Other specified hypothyroidism    PAF (paroxysmal atrial fibrillation)  (HCC)    s/p pulmonary vein isolation by cryoablation 10/04/15 Dr. [facility]don [facility]   PEA (Pulseless electrical activity) [facility])    ~ 01/13/21 at [facility] during admission DKA, volume overload, possible CAP, hypoxia s/p ACLS with ROSC ~ 10-15   Pneumonia    Primary insomnia    Rheumatoid arthritis (HCC)    Stroke (HCC)    Thyroid  disease    Type 1 diabetes mellitus (HCC)    Vitamin D  deficiency     Medications:  Scheduled:  allopurinol   50 mg Oral Daily   aspirin  EC  81 mg Oral Daily   busPIRone   10 mg Oral TID   carvedilol   12.5 mg Oral BID   cefadroxil   500 mg Oral Daily   enoxaparin  (LOVENOX ) injection  40 mg Subcutaneous Q24H   ezetimibe   10 mg Oral QPM   gabapentin   100 mg Oral BID   insulin  aspart  0-9 Units Subcutaneous TID WC   insulin  glargine-yfgn  18 Units Subcutaneous QHS   latanoprost   1 drop Both Eyes QHS   magnesium  oxide  800 mg Oral Daily   omega-3 acid ethyl esters  1 g Oral Daily   pantoprazole   40 mg Oral Daily   sodium chloride  flush  3 mL Intravenous Q12H   ticagrelor   90 mg Oral BID    Medications Prior to Admission  Medication Sig Dispense Refill   allopurinol  (ZYLOPRIM ) 100 MG tablet Take 0.5 tablets (50 mg total) by mouth daily. 90 tablet 1   aspirin  EC 81 MG tablet Take 1 tablet (81 mg total) by mouth daily. Swallow whole. 30 tablet 12   busPIRone  (BUSPAR ) 10 MG tablet Take 10 mg by mouth 3 (three) times daily.     carvedilol  (COREG ) 12.5 MG tablet Take 1 tablet (12.5 mg total) by mouth 2 (two) times daily. 180 tablet 1   Continuous Blood Gluc Sensor (FREESTYLE LIBRE 2 SENSOR) MISC Inject 1 Device into the skin every 14 (fourteen) days.     CRANBERRY PO Take 2 tablets by mouth daily.     EPINEPHrine  0.3 mg/0.3 mL IJ SOAJ injection Use as directed for life-threatening allergic reaction. 4 each 3  Evolocumab  (REPATHA  SURECLICK) 140 MG/ML SOAJ Inject 140 mg into the skin every 14 (fourteen) days. 6 mL 1   ezetimibe  (ZETIA ) 10 MG tablet Take 1  tablet (10 mg total) by mouth every evening. 90 tablet 1   gabapentin  (NEURONTIN ) 100 MG capsule Take 1 capsule (100 mg total) by mouth 2 (two) times daily. 180 capsule 0   HYDROcodone -acetaminophen  (NORCO) 7.5-325 MG tablet Take 1 tablet by mouth 3 (three) times daily as needed. (Patient taking differently: Take 1 tablet by mouth See admin instructions. Take 1 tablet by mouth at bedtime and an additional 1 tablet up to two times a day as needed for pain) 90 tablet 0   insulin  aspart (NOVOLOG  FLEXPEN) 100 UNIT/ML FlexPen Inject 2 - 10 Units before meals based on SSI. (Patient taking differently: Inject 2-10 Units into the skin See admin instructions. Inject 2-10 units into the skin before meals, based on SSI) 15 mL 3   insulin  glargine (LANTUS  SOLOSTAR) 100 UNIT/ML Solostar Pen Inject 60 Units into the skin daily. (Patient taking differently: Inject 40 Units into the skin daily.) 30 mL 5   latanoprost  (XALATAN ) 0.005 % ophthalmic solution Place 1 drop into both eyes at bedtime.     linaclotide  (LINZESS ) 145 MCG CAPS capsule Take 1 capsule (145 mcg total) by mouth daily before breakfast. (Patient taking differently: Take 145 mcg by mouth daily as needed (for constipation).) 90 capsule 1   LORazepam  (ATIVAN ) 0.5 MG tablet Take 1 tablet (0.5 mg total) by mouth daily as needed for anxiety. (Patient taking differently: Take 0.5 mg by mouth See admin instructions. Take 0.5 mg by mouth at bedtime and an additional 0.5 mg once a day as needed for anxiety) 90 tablet 1   magnesium  oxide (MAG-OX) 400 MG tablet Take 2 tablets (800 mg total) by mouth daily. Take 2 (400 mg) daily=800 mg (Patient taking differently: Take 800 mg by mouth daily.) 180 tablet 1   nitroGLYCERIN  (NITROSTAT ) 0.4 MG SL tablet DISSOLVE ONE TABLET UNDER THE TONGUE EVERY 5 MINUTES AS NEEDED FOR CHEST PAIN.  DO NOT EXCEED A TOTAL OF 3 DOSES IN 15 MINUTES (Patient taking differently: Place 0.4 mg under the tongue every 5 (five) minutes x 3 doses as  needed for chest pain.) 50 tablet 3   Omega-3 Fatty Acids (FISH OIL PO) Take 1 capsule by mouth daily.     OXYGEN  Inhale 2 L/min into the lungs at bedtime.     pantoprazole  (PROTONIX ) 40 MG tablet Take 1 tablet (40 mg total) by mouth daily. (Patient taking differently: Take 40 mg by mouth daily before breakfast.) 90 tablet 1   potassium chloride  SA (KLOR-CON  M) 20 MEQ tablet Take 2 tablets (40 mEq total) by mouth daily. (Patient taking differently: Take 20 mEq by mouth See admin instructions. Take 20 mEq by mouth on Tues and Sat ONLY) 180 tablet 1   PRESCRIPTION MEDICATION See admin instructions. CPAP- At bedtime     Probiotic CAPS Take 1 capsule by mouth daily.     promethazine  (PHENERGAN ) 25 MG tablet Take 1 tablet (25 mg total) by mouth daily as needed. (Patient taking differently: Take 25 mg by mouth daily as needed for nausea or vomiting.) 90 tablet 1   ticagrelor  (BRILINTA ) 90 MG TABS tablet Take 1 tablet (90 mg total) by mouth 2 (two) times daily. 180 tablet 1   torsemide  (DEMADEX ) 20 MG tablet Take 3 tablets (60 mg total) by mouth daily. 270 tablet 1   Glucagon  (GVOKE HYPOPEN   2-PACK) 0.5 MG/0.1ML SOAJ Inject 0.5 mg into the skin daily as needed. (Patient not taking: Reported on 07/13/2023) 0.2 mL 5   Insulin  Pen Needle (BD PEN NEEDLE NANO 2ND GEN) 32G X 4 MM MISC Inject 1 each into the skin in the morning, at noon, in the evening, and at bedtime. 600 each 3    ALLERGIES:   Allergies  Allergen Reactions   Naproxen Hives and Rash   Shellfish-Derived Products Hives, Swelling and Other (See Comments)    Angioedema    Strawberry (Diagnostic) Hives   Celecoxib Other (See Comments)    Stomach pain   Glucosamine Hives, Swelling and Other (See Comments)    Angioedema    Iron  Hives and Other (See Comments)    IV iron  transfusion - hives - Feb 2022 hospitalization  Patient said she CAN tolerate if given Benadryl  first   Metoclopramide Other (See Comments)    Facial twitching and  stuttering   Metolazone  Other (See Comments)    Acute renal failure   Statins Other (See Comments)    Muscle pain    FAM HX: Family History  Problem Relation Age of Onset   Breast cancer Mother 40       contralateral breast ca dx 1   Coronary artery disease Mother    Hypertension Mother    Hyperlipidemia Mother    Diabetes type II Mother    Lung cancer Mother 74   Diabetes Father    Hypertension Father    Mental illness Sister    Bipolar disorder Other    Depression Other    Schizophrenia Other    Cancer Other        MGF's sisters x2; unknown type    Social History:   reports that she quit smoking about 41 years ago. Her smoking use included cigarettes. She started smoking about 42 years ago. She has been exposed to tobacco smoke. She has never used smokeless tobacco. She reports that she does not drink alcohol and does not use drugs.  ROS: ROS: all other systems reviewed and are negative except as per HPI  Blood pressure 104/86, pulse 72, temperature 98.5 F (36.9 C), temperature source Oral, resp. rate 18, height 5' 5 (1.651 m), weight 106.5 kg, SpO2 97%. PHYSICAL EXAM: Physical Exam GEN NAD, lying in bed HEENT EOMI PERRL NECK no JVD PULM on O2, bibsilar crackles CV RRR ABD soft EXT 2+ ankle edema NEURO AAO x 3   Results for orders placed or performed during the hospital encounter of 07/13/23 (from the past 48 hours)  CBG monitoring, ED     Status: Abnormal   Collection Time: 07/14/23 12:32 PM  Result Value Ref Range   Glucose-Capillary 145 (H) 70 - 99 mg/dL    Comment: Glucose reference range applies only to samples taken after fasting for at least 8 hours.  Glucose, capillary     Status: Abnormal   Collection Time: 07/14/23  4:03 PM  Result Value Ref Range   Glucose-Capillary 119 (H) 70 - 99 mg/dL    Comment: Glucose reference range applies only to samples taken after fasting for at least 8 hours.  Glucose, capillary     Status: Abnormal   Collection  Time: 07/14/23  8:35 PM  Result Value Ref Range   Glucose-Capillary 137 (H) 70 - 99 mg/dL    Comment: Glucose reference range applies only to samples taken after fasting for at least 8 hours.  Glucose, capillary     Status: Abnormal  Collection Time: 07/15/23  6:52 AM  Result Value Ref Range   Glucose-Capillary 116 (H) 70 - 99 mg/dL    Comment: Glucose reference range applies only to samples taken after fasting for at least 8 hours.  CBC     Status: Abnormal   Collection Time: 07/15/23 10:01 AM  Result Value Ref Range   WBC 9.9 4.0 - 10.5 K/uL   RBC 3.31 (L) 3.87 - 5.11 MIL/uL   Hemoglobin 9.2 (L) 12.0 - 15.0 g/dL   HCT 70.4 (L) 63.9 - 53.9 %   MCV 89.1 80.0 - 100.0 fL   MCH 27.8 26.0 - 34.0 pg   MCHC 31.2 30.0 - 36.0 g/dL   RDW 85.3 88.4 - 84.4 %   Platelets 237 150 - 400 K/uL   nRBC 0.0 0.0 - 0.2 %    Comment: Performed at [facility], [address] [place], [place] [address]  Basic metabolic panel     Status: Abnormal   Collection Time: 07/15/23 10:01 AM  Result Value Ref Range   Sodium 138 135 - 145 mmol/L   Potassium 4.5 3.5 - 5.1 mmol/L   Chloride 110 98 - 111 mmol/L   CO2 21 (L) 22 - 32 mmol/L   Glucose, Bld 72 70 - 99 mg/dL    Comment: Glucose reference range applies only to samples taken after fasting for at least 8 hours.   BUN 69 (H) 6 - 20 mg/dL   Creatinine, Ser 7.23 (H) 0.44 - 1.00 mg/dL   Calcium  8.7 (L) 8.9 - 10.3 mg/dL   GFR, Estimated 19 (L) >60 mL/min    Comment: (NOTE) Calculated using the CKD-EPI Creatinine Equation (2021)    Anion gap 7 5 - 15    Comment: Performed at [facility], [address], [place], [place] [address]  Glucose, capillary     Status: Abnormal   Collection Time: 07/15/23 11:53 AM  Result Value Ref Range   Glucose-Capillary 69 (L) 70 - 99 mg/dL    Comment: Glucose reference range applies only to samples taken after fasting for at least 8 hours.  Glucose, capillary     Status: None   Collection Time: 07/15/23  12:25 PM  Result Value Ref Range   Glucose-Capillary 96 70 - 99 mg/dL    Comment: Glucose reference range applies only to samples taken after fasting for at least 8 hours.  Glucose, capillary     Status: Abnormal   Collection Time: 07/15/23  4:29 PM  Result Value Ref Range   Glucose-Capillary 116 (H) 70 - 99 mg/dL    Comment: Glucose reference range applies only to samples taken after fasting for at least 8 hours.  Glucose, capillary     Status: Abnormal   Collection Time: 07/15/23  9:14 PM  Result Value Ref Range   Glucose-Capillary 163 (H) 70 - 99 mg/dL    Comment: Glucose reference range applies only to samples taken after fasting for at least 8 hours.   Comment 1 Notify RN    Comment 2 Document in Chart   Basic metabolic panel     Status: Abnormal   Collection Time: 07/16/23  2:48 AM  Result Value Ref Range   Sodium 138 135 - 145 mmol/L   Potassium 4.7 3.5 - 5.1 mmol/L   Chloride 110 98 - 111 mmol/L   CO2 19 (L) 22 - 32 mmol/L   Glucose, Bld 73 70 - 99 mg/dL    Comment: Glucose reference  range applies only to samples taken after fasting for at least 8 hours.   BUN 68 (H) 6 - 20 mg/dL   Creatinine, Ser 7.34 (H) 0.44 - 1.00 mg/dL   Calcium  8.4 (L) 8.9 - 10.3 mg/dL   GFR, Estimated 20 (L) >60 mL/min    Comment: (NOTE) Calculated using the CKD-EPI Creatinine Equation (2021)    Anion gap 9 5 - 15    Comment: Performed at [facility], [address], [place], [place] [address]  Glucose, capillary     Status: Abnormal   Collection Time: 07/16/23  5:37 AM  Result Value Ref Range   Glucose-Capillary 39 (LL) 70 - 99 mg/dL    Comment: Glucose reference range applies only to samples taken after fasting for at least 8 hours.  Glucose, capillary     Status: Abnormal   Collection Time: 07/16/23  6:11 AM  Result Value Ref Range   Glucose-Capillary 68 (L) 70 - 99 mg/dL    Comment: Glucose reference range applies only to samples taken after fasting for at least 8 hours.  Glucose,  capillary     Status: Abnormal   Collection Time: 07/16/23  6:35 AM  Result Value Ref Range   Glucose-Capillary 106 (H) 70 - 99 mg/dL    Comment: Glucose reference range applies only to samples taken after fasting for at least 8 hours.    US  RENAL Result Date: 07/15/2023 CLINICAL DATA:  Acute kidney injury. EXAM: RENAL / URINARY TRACT ULTRASOUND COMPLETE COMPARISON:  None Available. FINDINGS: Right Kidney: Renal measurements: 11.5 cm x 3.7 cm x 4.7 cm = volume: 102.65 mL. Diffusely increased echogenicity of the renal parenchyma is noted. No mass or hydronephrosis visualized. Left Kidney: Renal measurements: 11.1 cm x 4.7 cm x 4.7 cm = volume: 128.04 mL. Diffusely increased echogenicity of the renal parenchyma is noted. No mass or hydronephrosis visualized. Bladder: Appears normal for degree of bladder distention. Other: None. IMPRESSION: Bilateral echogenic kidneys which may represent sequelae associated with medical renal disease. Electronically Signed   By: Suzen Dials M.D.   On: 07/15/2023 21:49   ECHOCARDIOGRAM LIMITED Result Date: 07/15/2023    ECHOCARDIOGRAM LIMITED REPORT   Patient Name:   Dana [name] Date of Exam: 07/15/2023 Medical Rec #:  [id number]      Height:       65.0 in Accession #:    [id number]     Weight:       231.0 lb Date of Birth:  1960/09/29      BSA:          2.103 m Patient Age:    62 years       BP:           141/58 mmHg Patient Gender: F              HR:           71 bpm. Exam Location:  Inpatient Procedure: Limited Echo, Intracardiac Opacification Agent, Limited Color Doppler            and Cardiac Doppler Indications:     CHF  History:         Patient has prior history of Echocardiogram examinations, most                  recent 12/09/2022. CHF, CAD and Previous Myocardial Infarction,                  Mitral Valve Disease; Risk Factors:Dyslipidemia  and Former                  Smoker.                   Mitral Valve: Mitra-Clip valve is present in the mitral                   position.  Sonographer:     Tillman Nora RVT RCS Referring Phys:  ALEENE JINNY PASSE Diagnosing Phys: Annabella Scarce MD IMPRESSIONS  1. Left ventricular ejection fraction, by estimation, is 55 to 60%. The left ventricle has normal function. The left ventricle has no regional wall motion abnormalities. There is mild left ventricular hypertrophy of the lateral segment. Left ventricular  diastolic function could not be evaluated. There is the interventricular septum is flattened in systole, consistent with right ventricular pressure overload.  2. Right ventricular systolic function is normal. The right ventricular size is normal. There is normal pulmonary artery systolic pressure.  3. Left atrial size was moderately dilated.  4. Mitral valve gradient unchanged from echo 11/2022. The mitral valve has been repaired/replaced. Mild mitral valve regurgitation. No evidence of mitral stenosis. The mean mitral valve gradient is 5.0 mmHg. There is a Mitra-Clip present in the mitral position.  5. The aortic valve is normal in structure. Aortic valve regurgitation is not visualized. No aortic stenosis is present.  6. The inferior vena cava is dilated in size with <50% respiratory variability, suggesting right atrial pressure of 15 mmHg. FINDINGS  Left Ventricle: Left ventricular ejection fraction, by estimation, is 55 to 60%. The left ventricle has normal function. The left ventricle has no regional wall motion abnormalities. Definity  contrast agent was given IV to delineate the left ventricular  endocardial borders. The left ventricular internal cavity size was normal in size. There is mild left ventricular hypertrophy of the lateral segment. The interventricular septum is flattened in systole, consistent with right ventricular pressure overload. Left ventricular diastolic function could not be evaluated. Left ventricular diastolic function could not be evaluated due to mitral valve repair. Right Ventricle: The right  ventricular size is normal. No increase in right ventricular wall thickness. Right ventricular systolic function is normal. There is normal pulmonary artery systolic pressure. The tricuspid regurgitant velocity is 1.61 m/s, and  with an assumed right atrial pressure of 15 mmHg, the estimated right ventricular systolic pressure is 25.4 mmHg. Left Atrium: Left atrial size was moderately dilated. Right Atrium: Right atrial size was normal in size. Pericardium: There is no evidence of pericardial effusion. Mitral Valve: Mitral valve gradient unchanged from echo 11/2022. The mitral valve has been repaired/replaced. Mild mitral valve regurgitation. There is a Mitra-Clip present in the mitral position. No evidence of mitral valve stenosis. MV peak gradient, 18.7 mmHg. The mean mitral valve gradient is 5.0 mmHg with average heart rate of 71 bpm. Tricuspid Valve: The tricuspid valve is normal in structure. Tricuspid valve regurgitation is trivial. No evidence of tricuspid stenosis. Aortic Valve: The aortic valve is normal in structure. Aortic valve regurgitation is not visualized. No aortic stenosis is present. Pulmonic Valve: The pulmonic valve was normal in structure. Pulmonic valve regurgitation is not visualized. No evidence of pulmonic stenosis. Aorta: The aortic root is normal in size and structure. Venous: The inferior vena cava is dilated in size with less than 50% respiratory variability, suggesting right atrial pressure of 15 mmHg. IAS/Shunts: No atrial level shunt detected by color flow Doppler. Additional Comments: Spectral Doppler performed. Color Doppler  performed.  LEFT VENTRICLE PLAX 2D LVIDd:         5.00 cm LVIDs:         3.40 cm LV PW:         1.20 cm LV IVS:        1.00 cm LVOT diam:     1.70 cm LVOT Area:     2.27 cm  IVC IVC diam: 2.30 cm LEFT ATRIUM             Index LA diam:        3.20 cm 1.52 cm/m LA Vol (A2C):   85.8 ml 40.79 ml/m LA Vol (A4C):   63.2 ml 30.05 ml/m LA Biplane Vol: 79.3 ml 37.70  ml/m                        PULMONIC VALVE AORTA                 PV Vmax:       0.92 m/s Ao Root diam: 2.70 cm PV Peak grad:  3.4 mmHg Ao Asc diam:  3.40 cm  MITRAL VALVE                TRICUSPID VALVE MV Area (PHT): 3.72 cm     TR Peak grad:   10.4 mmHg MV Peak grad:  18.7 mmHg    TR Vmax:        161.00 cm/s MV Mean grad:  5.0 mmHg MV Vmax:       2.16 m/s     SHUNTS MV Vmean:      97.4 cm/s    Systemic Diam: 1.70 cm MV Decel Time: 204 msec MV E velocity: 221.00 cm/s MV A velocity: 101.00 cm/s MV E/A ratio:  2.19 Annabella Scarce MD Electronically signed by Annabella Scarce MD Signature Date/Time: 07/15/2023/6:24:49 PM    Final (Updated)     Assessment/Plan  CKD with h/o CIN: - got contrast 12/28--> small rise in Cr possibly d/t that - pt and dtr understand risks/ benefits of more contrast - has had CIN in the past- no dialysis required - reasonable to proceed with cath since not uptrending today- have d/w PMD and cardiology - pre-cath fluids on board  2.  Chest pain/NSTEMI  - multiple PCIs  3.  DM  - per primary  4.  Dispo: pending  Dana [name] 07/16/2023, 11:39 AM

## 2023-07-16 NOTE — Plan of Care (Signed)
   Problem: Education: Goal: Ability to describe self-care measures that may prevent or decrease complications (Diabetes Survival Skills Education) will improve Outcome: Progressing Goal: Individualized Educational Video(s) Outcome: Progressing

## 2023-07-16 NOTE — Progress Notes (Addendum)
 Patient back from cath lab at this time. Patient reports normal sensation in RLE. Minimal bleeding at site.

## 2023-07-16 NOTE — Progress Notes (Signed)
 Rounding Note    Patient Name: Dana [name] Date of Encounter: 07/16/2023  [facility] Cardiologist: Toribio Fuel, MD   Subjective   62 yo with hx of CAD ,  admitted with angina   For cath today ( femoral access)  Has no questions about upcoming cath   Cath was postponed yesterday due to rise I creatinine  Nephrology is evaluating today     Will repeat echo to make sure her reduce renal function is not due to CHF      Inpatient Medications    Scheduled Meds:  allopurinol   50 mg Oral Daily   aspirin  EC  81 mg Oral Daily   busPIRone   10 mg Oral TID   carvedilol   12.5 mg Oral BID   cefadroxil   500 mg Oral Daily   enoxaparin  (LOVENOX ) injection  40 mg Subcutaneous Q24H   ezetimibe   10 mg Oral QPM   gabapentin   100 mg Oral BID   insulin  aspart  0-9 Units Subcutaneous TID WC   insulin  glargine-yfgn  18 Units Subcutaneous QHS   latanoprost   1 drop Both Eyes QHS   magnesium  oxide  800 mg Oral Daily   omega-3 acid ethyl esters  1 g Oral Daily   pantoprazole   40 mg Oral Daily   ticagrelor   90 mg Oral BID   Continuous Infusions:  dextrose  5 % and 0.9 % NaCl     nitroGLYCERIN  10 mcg/min (07/13/23 1825)   PRN Meds: acetaminophen , HYDROcodone -acetaminophen , hydrOXYzine , linaclotide , ondansetron  (ZOFRAN ) IV **OR** ondansetron    Vital Signs    Vitals:   07/16/23 0000 07/16/23 0100 07/16/23 0401 07/16/23 0504  BP: (!) 162/77 (!) 161/76 104/86   Pulse: 79 75 72   Resp:  18 18   Temp:  98.8 F (37.1 C) 98.5 F (36.9 C)   TempSrc:  Oral Oral   SpO2: 95% 96% 97%   Weight:    106.5 kg  Height:        Intake/Output Summary (Last 24 hours) at 07/16/2023 0954 Last data filed at 07/16/2023 9297 Gross per 24 hour  Intake 843.67 ml  Output --  Net 843.67 ml      07/16/2023    5:04 AM 07/14/2023    4:00 PM 07/13/2023    8:33 PM  Last 3 Weights  Weight (lbs) 234 lb 12.6 oz 231 lb 0.7 oz 231 lb 7.7 oz  Weight (kg) 106.5 kg 104.8 kg 105 kg       Telemetry    NSR   NSR - Personally Reviewed  ECG     - Personally Reviewed  Physical Exam   Physical Exam: Blood pressure 104/86, pulse 72, temperature 98.5 F (36.9 C), temperature source Oral, resp. rate 18, height 5' 5 (1.651 m), weight 106.5 kg, SpO2 97%.       GEN:  mildly obese female,  HEENT: Normal NECK: No JVD; No carotid bruits LYMPHATICS: No lymphadenopathy CARDIAC: RRR , no murmurs, rubs, gallops RESPIRATORY:  few rales in bases  ABDOMEN: Soft, non-tender, non-distended MUSCULOSKELETAL:  No edema; No deformity  SKIN: Warm and dry NEUROLOGIC:  Alert and oriented x 3   Labs    High Sensitivity Troponin:   Recent Labs  Lab 07/13/23 1325 07/13/23 1505  TROPONINIHS 21* 31*     Chemistry Recent Labs  Lab 07/12/23 1105 07/13/23 1325 07/14/23 0512 07/15/23 1001 07/16/23 0248  NA 139   < > 137 138 138  K 4.4   < >  3.9 4.5 4.7  CL 100   < > 106 110 110  CO2 22   < > 22 21* 19*  GLUCOSE 162*   < > 123* 72 73  BUN 70*   < > 65* 69* 68*  CREATININE 2.03*   < > 2.11* 2.76* 2.65*  CALCIUM  9.2   < > 8.5* 8.7* 8.4*  PROT 7.3  --   --   --   --   ALBUMIN  4.0  --   --   --   --   AST 21  --   --   --   --   ALT 25  --   --   --   --   ALKPHOS 226*  --   --   --   --   BILITOT 0.5  --   --   --   --   GFRNONAA  --    < > 26* 19* 20*  ANIONGAP  --    < > 9 7 9    < > = values in this interval not displayed.    Lipids  Recent Labs  Lab 07/12/23 1105  CHOL 92*  TRIG 81  HDL 47  LABVLDL 17  LDLCALC 28  CHOLHDL 2.0    Hematology Recent Labs  Lab 07/13/23 1325 07/13/23 1430 07/14/23 0512 07/15/23 1001  WBC 14.6*  --  10.0 9.9  RBC 3.66*  --  3.34* 3.31*  HGB 10.1* 11.2* 9.3* 9.2*  HCT 32.1* 33.0* 29.6* 29.5*  MCV 87.7  --  88.6 89.1  MCH 27.6  --  27.8 27.8  MCHC 31.5  --  31.4 31.2  RDW 14.4  --  14.4 14.6  PLT 250  --  239 237   Thyroid  No results for input(s): TSH, FREET4 in the last 168 hours.  BNP Recent Labs  Lab  07/13/23 1325  BNP 212.4*    DDimer No results for input(s): DDIMER in the last 168 hours.   Radiology    US  RENAL Result Date: 07/15/2023 CLINICAL DATA:  Acute kidney injury. EXAM: RENAL / URINARY TRACT ULTRASOUND COMPLETE COMPARISON:  None Available. FINDINGS: Right Kidney: Renal measurements: 11.5 cm x 3.7 cm x 4.7 cm = volume: 102.65 mL. Diffusely increased echogenicity of the renal parenchyma is noted. No mass or hydronephrosis visualized. Left Kidney: Renal measurements: 11.1 cm x 4.7 cm x 4.7 cm = volume: 128.04 mL. Diffusely increased echogenicity of the renal parenchyma is noted. No mass or hydronephrosis visualized. Bladder: Appears normal for degree of bladder distention. Other: None. IMPRESSION: Bilateral echogenic kidneys which may represent sequelae associated with medical renal disease. Electronically Signed   By: Suzen Dials M.D.   On: 07/15/2023 21:49   ECHOCARDIOGRAM LIMITED Result Date: 07/15/2023    ECHOCARDIOGRAM LIMITED REPORT   Patient Name:   Dana [name] Date of Exam: 07/15/2023 Medical Rec #:  [id number]      Height:       65.0 in Accession #:    [id number]     Weight:       231.0 lb Date of Birth:  Oct 03, 1960      BSA:          2.103 m Patient Age:    62 years       BP:           141/58 mmHg Patient Gender: F              HR:  71 bpm. Exam Location:  Inpatient Procedure: Limited Echo, Intracardiac Opacification Agent, Limited Color Doppler            and Cardiac Doppler Indications:     CHF  History:         Patient has prior history of Echocardiogram examinations, most                  recent 12/09/2022. CHF, CAD and Previous Myocardial Infarction,                  Mitral Valve Disease; Risk Factors:Dyslipidemia and Former                  Smoker.                   Mitral Valve: Mitra-Clip valve is present in the mitral                  position.  Sonographer:     Tillman Nora RVT RCS Referring Phys:  ALEENE JINNY PASSE Diagnosing Phys: Annabella Scarce MD  IMPRESSIONS  1. Left ventricular ejection fraction, by estimation, is 55 to 60%. The left ventricle has normal function. The left ventricle has no regional wall motion abnormalities. There is mild left ventricular hypertrophy of the lateral segment. Left ventricular  diastolic function could not be evaluated. There is the interventricular septum is flattened in systole, consistent with right ventricular pressure overload.  2. Right ventricular systolic function is normal. The right ventricular size is normal. There is normal pulmonary artery systolic pressure.  3. Left atrial size was moderately dilated.  4. Mitral valve gradient unchanged from echo 11/2022. The mitral valve has been repaired/replaced. Mild mitral valve regurgitation. No evidence of mitral stenosis. The mean mitral valve gradient is 5.0 mmHg. There is a Mitra-Clip present in the mitral position.  5. The aortic valve is normal in structure. Aortic valve regurgitation is not visualized. No aortic stenosis is present.  6. The inferior vena cava is dilated in size with <50% respiratory variability, suggesting right atrial pressure of 15 mmHg. FINDINGS  Left Ventricle: Left ventricular ejection fraction, by estimation, is 55 to 60%. The left ventricle has normal function. The left ventricle has no regional wall motion abnormalities. Definity  contrast agent was given IV to delineate the left ventricular  endocardial borders. The left ventricular internal cavity size was normal in size. There is mild left ventricular hypertrophy of the lateral segment. The interventricular septum is flattened in systole, consistent with right ventricular pressure overload. Left ventricular diastolic function could not be evaluated. Left ventricular diastolic function could not be evaluated due to mitral valve repair. Right Ventricle: The right ventricular size is normal. No increase in right ventricular wall thickness. Right ventricular systolic function is normal. There is  normal pulmonary artery systolic pressure. The tricuspid regurgitant velocity is 1.61 m/s, and  with an assumed right atrial pressure of 15 mmHg, the estimated right ventricular systolic pressure is 25.4 mmHg. Left Atrium: Left atrial size was moderately dilated. Right Atrium: Right atrial size was normal in size. Pericardium: There is no evidence of pericardial effusion. Mitral Valve: Mitral valve gradient unchanged from echo 11/2022. The mitral valve has been repaired/replaced. Mild mitral valve regurgitation. There is a Mitra-Clip present in the mitral position. No evidence of mitral valve stenosis. MV peak gradient, 18.7 mmHg. The mean mitral valve gradient is 5.0 mmHg with average heart rate of 71 bpm. Tricuspid Valve: The tricuspid valve is normal in  structure. Tricuspid valve regurgitation is trivial. No evidence of tricuspid stenosis. Aortic Valve: The aortic valve is normal in structure. Aortic valve regurgitation is not visualized. No aortic stenosis is present. Pulmonic Valve: The pulmonic valve was normal in structure. Pulmonic valve regurgitation is not visualized. No evidence of pulmonic stenosis. Aorta: The aortic root is normal in size and structure. Venous: The inferior vena cava is dilated in size with less than 50% respiratory variability, suggesting right atrial pressure of 15 mmHg. IAS/Shunts: No atrial level shunt detected by color flow Doppler. Additional Comments: Spectral Doppler performed. Color Doppler performed.  LEFT VENTRICLE PLAX 2D LVIDd:         5.00 cm LVIDs:         3.40 cm LV PW:         1.20 cm LV IVS:        1.00 cm LVOT diam:     1.70 cm LVOT Area:     2.27 cm  IVC IVC diam: 2.30 cm LEFT ATRIUM             Index LA diam:        3.20 cm 1.52 cm/m LA Vol (A2C):   85.8 ml 40.79 ml/m LA Vol (A4C):   63.2 ml 30.05 ml/m LA Biplane Vol: 79.3 ml 37.70 ml/m                        PULMONIC VALVE AORTA                 PV Vmax:       0.92 m/s Ao Root diam: 2.70 cm PV Peak grad:  3.4  mmHg Ao Asc diam:  3.40 cm  MITRAL VALVE                TRICUSPID VALVE MV Area (PHT): 3.72 cm     TR Peak grad:   10.4 mmHg MV Peak grad:  18.7 mmHg    TR Vmax:        161.00 cm/s MV Mean grad:  5.0 mmHg MV Vmax:       2.16 m/s     SHUNTS MV Vmean:      97.4 cm/s    Systemic Diam: 1.70 cm MV Decel Time: 204 msec MV E velocity: 221.00 cm/s MV A velocity: 101.00 cm/s MV E/A ratio:  2.19 Annabella Scarce MD Electronically signed by Annabella Scarce MD Signature Date/Time: 07/15/2023/6:24:49 PM    Final (Updated)     Cardiac Studies     Patient Profile     62 y.o. female  wit hhx of cAD Admitted with CP    Assessment & Plan     CAD >  hx of CAD in the past .   Creatinine is not significantly better from yesterday .  Is being evaluated by nephrology   Her LV systolic function remains normal   Waiting to hear from Dr. Gearline of Nephrology before we schedule her cath .   2.  HLD : on repatha  and zetia    3.  Hx of MR, s/p mitra clip          For questions or updates, please contact [facility] Please consult [email or web address] for contact info under        Signed, Aleene Passe, MD  07/16/2023, 9:54 AM

## 2023-07-16 NOTE — Progress Notes (Signed)
 PROGRESS NOTE Dana [name]  FMW:[id number] DOB: 1961/07/15 DOA: 07/13/2023 PCP: Sherre Clapper, MD  Brief Narrative/Hospital Course:  anxiety, chronic back pain, CKD 3B, OSA on CPAP, COPD, diastolic HF, HTN, HLD, gout, GERD, T1DM, and multivessel CAD/NSTEMI s/p multiple PCI's and complications including contrast-induced nephropathy who presents with chest pain  In ED, vitals stable afebrile, labs with creatinine 2.0 BUN 70 hemoglobin 9.9 g, troponin elevated at 21-31 flat, cardiology consulted and admitted for angina. EKG without ischemic changes.  Patient was placed in nitro drip with plan for cardiac cath on weekday> but due to elevated creatinine 12/30 cath postponed pending improvement in renal function.  12/31>Blood sugar dropped in 65 but asymptomatic,semglee  decreased from 40u to 30 u.    Subjective: Seen and examined No chest pain Overnight afebrile SBP 101-1 60s fluctuating.  Used CPAP overnight Despite decreasing her long-acting insulin  patient had a hypoglycemic event as low as 39 and symptomatic with sweating> now trending up 106, Labs shows creatinine slightly better at 2.6 nephro seeing today.  Assessment and Plan: Principal Problem:   Angina pectoris (HCC) Active Problems:   Hyperglycemia  Angina CAD with multivessel disease, prior history of NSTEMI status post multiple PCI : Her chest pain was similar to her previous angina with relief from nitroglycerin .  Currently chest pain-free, continue on nitroglycerin  drip,aspirin  81 Coreg  12.5, Zetia , Brilinta .  On 12/30 due to elevated creatinine cardiac cath has been postponed and IV fluid started.  Nephrology has been consulted and will likely go for cath today   Klebsiella pneumonia UTI POA: Continue Duricef 500 daily renal dose    Type 1 diabetes mellitus on insulin  with hypoglycemia: A1c up to 9 2 months ago, PTA on 60 units Lantus  w/ intermittent hypoglycemia at home I n50s- and was taking 40u > semeglee was decreased to 30  units 12/30 but still had hypoglycemia> decreased to 18 U but if blood sugar remains on lower side will revisit the dose Managing her diabetes, cont ssi. On D5W as well. Recent Labs  Lab 07/15/23 1629 07/15/23 2114 07/16/23 0537 07/16/23 0611 07/16/23 0635  GLUCAP 116* 163* 39* 68* 106*    AKI on CKD 3B vs new baseline: Baseline creatinine fluctuating as below, on admission 2.1>1.8 and trending up.  Has had no hypotension or nephrotoxic agent. Torsemide  on hold, received IV fluids precath.  Renal ultrasound shows medical renal disease, IV fluid started gently 12/30 creatinine slightly better, we will continue on IV fluid for hypercalcemia.  Nephrology consulted given need for cath. Avoid nephrotoxic medications including NSAIDs and iodinated intravenous contrast exposure unless the latter is absolutely indicated.Preferred narcotic agents for pain control are:hydromorphone , fentanyl , and methadone and avoid morphine .Avoid Baclofen and avoid oral sodium phosphate  and magnesium  citrate based laxatives / bowel preps. Continue strict Input and Output monitoring and serial renal functions.  Recent Labs    02/19/23 0308 02/20/23 0252 02/27/23 1418 04/04/23 1122 05/14/23 1123 07/12/23 1105 07/13/23 1325 07/13/23 1430 07/14/23 0512 07/15/23 1001 07/16/23 0248  BUN 56* 45* 57* 59* 60* 70* 67*  --  65* 69* 68*  CREATININE 2.94* 2.54* 2.18* 2.04* 2.23* 2.03* 1.83*  --  2.11* 2.76* 2.65*  CO2 24 22 20 22 24 22 22   --  22 21* 19*  K 4.3 4.4 4.3 4.5 4.5 4.4 4.3 4.2 3.9 4.5 4.7     HFpEF MR s/p MitraClip: Holding torsemide  due to renal dysfunction.monitor volume status.Cont her Coreg , compression stockings   HLD: Continue Zetia  and Repatha    Chronic  back pain  Continue home Norco and gabapentin   Normocytic anemia-likely from anemia of CKD: B/l hb 8 to 9 g range and remains stable.  Monitor   OSA: Continue bedtime CPAP an [place] 02  Obesity: Patient's Body mass index is 39.07 kg/m. :  Will benefit with PCP follow-up, weight loss,healthy lifestyle   DVT prophylaxis: enoxaparin  (LOVENOX ) injection 40 mg Start: 07/13/23 1900 Code Status:   Code Status: Full Code Family Communication: plan of care discussed with patient no family at bedside today. Patient status is: Remains hospitalized because of severity of illness Level of care: Telemetry Cardiac   Dispo: The patient is from: home with family            Anticipated disposition: TBD Objective: Vitals last 24 hrs: Vitals:   07/16/23 0000 07/16/23 0100 07/16/23 0401 07/16/23 0504  BP: (!) 162/77 (!) 161/76 104/86   Pulse: 79 75 72   Resp:  18 18   Temp:  98.8 F (37.1 C) 98.5 F (36.9 C)   TempSrc:  Oral Oral   SpO2: 95% 96% 97%   Weight:    106.5 kg  Height:       Weight change: 1.7 kg  Physical Examination: General exam: alert awake, oriented x3 HEENT:Oral mucosa moist, Ear/Nose WNL grossly Respiratory system: Bilaterally clear BS,no use of accessory muscle Cardiovascular system: S1 & S2 +, No JVD. Gastrointestinal system: Abdomen soft,NT,ND, BS+ Nervous System: Alert, awake, moving all extremities,and following commands. Extremities: LE edema + non pitting,distal peripheral pulses palpable and warm.  Skin: No rashes,no icterus. MSK: Normal muscle bulk,tone, power   Medications reviewed:  Scheduled Meds:  allopurinol   50 mg Oral Daily   aspirin  EC  81 mg Oral Daily   busPIRone   10 mg Oral TID   carvedilol   12.5 mg Oral BID   cefadroxil   500 mg Oral Daily   enoxaparin  (LOVENOX ) injection  40 mg Subcutaneous Q24H   ezetimibe   10 mg Oral QPM   gabapentin   100 mg Oral BID   insulin  aspart  0-9 Units Subcutaneous TID WC   insulin  glargine-yfgn  18 Units Subcutaneous QHS   latanoprost   1 drop Both Eyes QHS   magnesium  oxide  800 mg Oral Daily   omega-3 acid ethyl esters  1 g Oral Daily   pantoprazole   40 mg Oral Daily   ticagrelor   90 mg Oral BID   Continuous Infusions:  sodium chloride       Followed by   sodium chloride      dextrose  5 % and 0.9 % NaCl 40 mL/hr at 07/16/23 1006   nitroGLYCERIN  10 mcg/min (07/13/23 1825)      Diet Order             Diet NPO time specified Except for: Sips with Meds  Diet effective midnight                  Intake/Output Summary (Last 24 hours) at 07/16/2023 1046 Last data filed at 07/16/2023 0702 Gross per 24 hour  Intake 843.67 ml  Output --  Net 843.67 ml   Net IO Since Admission: 3,428.62 mL [07/16/23 1046]  Wt Readings from Last 3 Encounters:  07/16/23 106.5 kg  07/12/23 105.2 kg  05/14/23 104.7 kg     Unresulted Labs (From admission, onward)     Start     Ordered   07/16/23 0500  Basic metabolic panel  Daily,   R      07/15/23 9056  Data Reviewed: I have personally reviewed following labs and imaging studies CBC: Recent Labs  Lab 07/12/23 1105 07/13/23 1325 07/13/23 1430 07/14/23 0512 07/15/23 1001  WBC 11.0* 14.6*  --  10.0 9.9  NEUTROABS 8.0*  --   --   --   --   HGB 9.9* 10.1* 11.2* 9.3* 9.2*  HCT 31.5* 32.1* 33.0* 29.6* 29.5*  MCV 88 87.7  --  88.6 89.1  PLT 256 250  --  239 237   Basic Metabolic Panel:  Recent Labs  Lab 07/12/23 1105 07/13/23 1325 07/13/23 1430 07/14/23 0512 07/15/23 1001 07/16/23 0248  NA 139 134* 138 137 138 138  K 4.4 4.3 4.2 3.9 4.5 4.7  CL 100 101  --  106 110 110  CO2 22 22  --  22 21* 19*  GLUCOSE 162* 328*  --  123* 72 73  BUN 70* 67*  --  65* 69* 68*  CREATININE 2.03* 1.83*  --  2.11* 2.76* 2.65*  CALCIUM  9.2 8.7*  --  8.5* 8.7* 8.4*   GFR: Estimated Creatinine Clearance: 27.4 mL/min (A) (by C-G formula based on SCr of 2.65 mg/dL (H)). Liver Function Tests:  Recent Labs  Lab 07/12/23 1105  AST 21  ALT 25  ALKPHOS 226*  BILITOT 0.5  PROT 7.3  ALBUMIN  4.0   Recent Labs  Lab 07/15/23 1629 07/15/23 2114 07/16/23 0537 07/16/23 0611 07/16/23 0635  GLUCAP 116* 163* 39* 68* 106*  Sepsis Labs: No results for input(s): PROCALCITON,  LATICACIDVEN in the last 168 hours. Recent Results (from the past 240 hours)  Urine Culture     Status: Abnormal   Collection Time: 07/13/23  4:45 PM   Specimen: Urine, Random  Result Value Ref Range Status   Specimen Description URINE, RANDOM  Final   Special Requests   Final    NONE Reflexed from [id number] Performed at [facility], [address] [place], [place] [address]    Culture >=100,000 COLONIES/mL KLEBSIELLA PNEUMONIAE (A)  Final   Report Status 07/15/2023 FINAL  Final   Organism ID, Bacteria KLEBSIELLA PNEUMONIAE (A)  Final      Susceptibility   Klebsiella pneumoniae - MIC*    AMPICILLIN >=32 RESISTANT Resistant     CEFAZOLIN  <=4 SENSITIVE Sensitive     CEFEPIME <=0.12 SENSITIVE Sensitive     CEFTRIAXONE  <=0.25 SENSITIVE Sensitive     CIPROFLOXACIN  <=0.25 SENSITIVE Sensitive     GENTAMICIN <=1 SENSITIVE Sensitive     IMIPENEM <=0.25 SENSITIVE Sensitive     NITROFURANTOIN  64 INTERMEDIATE Intermediate     TRIMETH /SULFA  <=20 SENSITIVE Sensitive     AMPICILLIN/SULBACTAM 8 SENSITIVE Sensitive     PIP/TAZO <=4 SENSITIVE Sensitive ug/mL    * >=100,000 COLONIES/mL KLEBSIELLA PNEUMONIAE    Antimicrobials/Microbiology: Anti-infectives (From admission, onward)    Start     Dose/Rate Route Frequency Ordered Stop   07/15/23 1800  cefadroxil  (DURICEF) capsule 500 mg        500 mg Oral Daily 07/15/23 1000 07/18/23 1759   07/14/23 1600  cefTRIAXone  (ROCEPHIN ) 1 g in sodium chloride  0.9 % 100 mL IVPB  Status:  Discontinued        1 g 200 mL/hr over 30 Minutes Intravenous Every 24 hours 07/13/23 2039 07/15/23 1000   07/13/23 1730  cefTRIAXone  (ROCEPHIN ) 1 g in sodium chloride  0.9 % 100 mL IVPB        1 g 200 mL/hr over 30 Minutes Intravenous  Once 07/13/23 1726 07/13/23 1916  Component Value Date/Time   SDES URINE, RANDOM 07/13/2023 1645   SPECREQUEST  07/13/2023 1645    NONE Reflexed from [id number] Performed at [facility], [address],  [place], [place] [address]    CULT >=100,000 COLONIES/mL KLEBSIELLA PNEUMONIAE (A) 07/13/2023 1645   REPTSTATUS 07/15/2023 FINAL 07/13/2023 1645     Radiology Studies: US  RENAL Result Date: 07/15/2023 CLINICAL DATA:  Acute kidney injury. EXAM: RENAL / URINARY TRACT ULTRASOUND COMPLETE COMPARISON:  None Available. FINDINGS: Right Kidney: Renal measurements: 11.5 cm x 3.7 cm x 4.7 cm = volume: 102.65 mL. Diffusely increased echogenicity of the renal parenchyma is noted. No mass or hydronephrosis visualized. Left Kidney: Renal measurements: 11.1 cm x 4.7 cm x 4.7 cm = volume: 128.04 mL. Diffusely increased echogenicity of the renal parenchyma is noted. No mass or hydronephrosis visualized. Bladder: Appears normal for degree of bladder distention. Other: None. IMPRESSION: Bilateral echogenic kidneys which may represent sequelae associated with medical renal disease. Electronically Signed   By: Suzen Dials M.D.   On: 07/15/2023 21:49   ECHOCARDIOGRAM LIMITED Result Date: 07/15/2023    ECHOCARDIOGRAM LIMITED REPORT   Patient Name:   Dana [name] Date of Exam: 07/15/2023 Medical Rec #:  [id number]      Height:       65.0 in Accession #:    [id number]     Weight:       231.0 lb Date of Birth:  Apr 11, 1961      BSA:          2.103 m Patient Age:    62 years       BP:           141/58 mmHg Patient Gender: F              HR:           71 bpm. Exam Location:  Inpatient Procedure: Limited Echo, Intracardiac Opacification Agent, Limited Color Doppler            and Cardiac Doppler Indications:     CHF  History:         Patient has prior history of Echocardiogram examinations, most                  recent 12/09/2022. CHF, CAD and Previous Myocardial Infarction,                  Mitral Valve Disease; Risk Factors:Dyslipidemia and Former                  Smoker.                   Mitral Valve: Mitra-Clip valve is present in the mitral                  position.  Sonographer:     Tillman Nora RVT RCS Referring Phys:   ALEENE JINNY PASSE Diagnosing Phys: Annabella Scarce MD IMPRESSIONS  1. Left ventricular ejection fraction, by estimation, is 55 to 60%. The left ventricle has normal function. The left ventricle has no regional wall motion abnormalities. There is mild left ventricular hypertrophy of the lateral segment. Left ventricular  diastolic function could not be evaluated. There is the interventricular septum is flattened in systole, consistent with right ventricular pressure overload.  2. Right ventricular systolic function is normal. The right ventricular size is normal. There is normal pulmonary artery systolic pressure.  3. Left atrial size was moderately dilated.  4.  Mitral valve gradient unchanged from echo 11/2022. The mitral valve has been repaired/replaced. Mild mitral valve regurgitation. No evidence of mitral stenosis. The mean mitral valve gradient is 5.0 mmHg. There is a Mitra-Clip present in the mitral position.  5. The aortic valve is normal in structure. Aortic valve regurgitation is not visualized. No aortic stenosis is present.  6. The inferior vena cava is dilated in size with <50% respiratory variability, suggesting right atrial pressure of 15 mmHg. FINDINGS  Left Ventricle: Left ventricular ejection fraction, by estimation, is 55 to 60%. The left ventricle has normal function. The left ventricle has no regional wall motion abnormalities. Definity  contrast agent was given IV to delineate the left ventricular  endocardial borders. The left ventricular internal cavity size was normal in size. There is mild left ventricular hypertrophy of the lateral segment. The interventricular septum is flattened in systole, consistent with right ventricular pressure overload. Left ventricular diastolic function could not be evaluated. Left ventricular diastolic function could not be evaluated due to mitral valve repair. Right Ventricle: The right ventricular size is normal. No increase in right ventricular wall thickness.  Right ventricular systolic function is normal. There is normal pulmonary artery systolic pressure. The tricuspid regurgitant velocity is 1.61 m/s, and  with an assumed right atrial pressure of 15 mmHg, the estimated right ventricular systolic pressure is 25.4 mmHg. Left Atrium: Left atrial size was moderately dilated. Right Atrium: Right atrial size was normal in size. Pericardium: There is no evidence of pericardial effusion. Mitral Valve: Mitral valve gradient unchanged from echo 11/2022. The mitral valve has been repaired/replaced. Mild mitral valve regurgitation. There is a Mitra-Clip present in the mitral position. No evidence of mitral valve stenosis. MV peak gradient, 18.7 mmHg. The mean mitral valve gradient is 5.0 mmHg with average heart rate of 71 bpm. Tricuspid Valve: The tricuspid valve is normal in structure. Tricuspid valve regurgitation is trivial. No evidence of tricuspid stenosis. Aortic Valve: The aortic valve is normal in structure. Aortic valve regurgitation is not visualized. No aortic stenosis is present. Pulmonic Valve: The pulmonic valve was normal in structure. Pulmonic valve regurgitation is not visualized. No evidence of pulmonic stenosis. Aorta: The aortic root is normal in size and structure. Venous: The inferior vena cava is dilated in size with less than 50% respiratory variability, suggesting right atrial pressure of 15 mmHg. IAS/Shunts: No atrial level shunt detected by color flow Doppler. Additional Comments: Spectral Doppler performed. Color Doppler performed.  LEFT VENTRICLE PLAX 2D LVIDd:         5.00 cm LVIDs:         3.40 cm LV PW:         1.20 cm LV IVS:        1.00 cm LVOT diam:     1.70 cm LVOT Area:     2.27 cm  IVC IVC diam: 2.30 cm LEFT ATRIUM             Index LA diam:        3.20 cm 1.52 cm/m LA Vol (A2C):   85.8 ml 40.79 ml/m LA Vol (A4C):   63.2 ml 30.05 ml/m LA Biplane Vol: 79.3 ml 37.70 ml/m                        PULMONIC VALVE AORTA                 PV Vmax:        0.92 m/s Ao  Root diam: 2.70 cm PV Peak grad:  3.4 mmHg Ao Asc diam:  3.40 cm  MITRAL VALVE                TRICUSPID VALVE MV Area (PHT): 3.72 cm     TR Peak grad:   10.4 mmHg MV Peak grad:  18.7 mmHg    TR Vmax:        161.00 cm/s MV Mean grad:  5.0 mmHg MV Vmax:       2.16 m/s     SHUNTS MV Vmean:      97.4 cm/s    Systemic Diam: 1.70 cm MV Decel Time: 204 msec MV E velocity: 221.00 cm/s MV A velocity: 101.00 cm/s MV E/A ratio:  2.19 Annabella Scarce MD Electronically signed by Annabella Scarce MD Signature Date/Time: 07/15/2023/6:24:49 PM    Final (Updated)     LOS: 3 days   Total time spent in review of labs and imaging, patient evaluation, formulation of plan, documentation and communication with family: 50 minutes  Mennie LAMY, MD [facility]  07/16/2023, 10:46 AM

## 2023-07-16 NOTE — Interval H&P Note (Signed)
 History and Physical Interval Note:  07/16/2023 1:42 PM  Dana [name]  has presented today for surgery, with the diagnosis of unstable angina.  The various methods of treatment have been discussed with the patient and family. After consideration of risks, benefits and other options for treatment, the patient has consented to  Procedure(s): LEFT HEART CATH AND CORONARY ANGIOGRAPHY (N/A) as a surgical intervention.  The patient's history has been reviewed, patient examined, no change in status, stable for surgery.  I have reviewed the patient's chart and labs.  Questions were answered to the patient's satisfaction.    Cath Lab Visit (complete for each Cath Lab visit)  Clinical Evaluation Leading to the Procedure:   ACS: No.  Non-ACS:    Anginal Classification: CCS III  Anti-ischemic medical therapy: Minimal Therapy (1 class of medications)  Non-Invasive Test Results: No non-invasive testing performed  Prior CABG: No previous CABG        Dana [name]

## 2023-07-17 DIAGNOSIS — F411 Generalized anxiety disorder: Secondary | ICD-10-CM | POA: Diagnosis not present

## 2023-07-17 DIAGNOSIS — Z95818 Presence of other cardiac implants and grafts: Secondary | ICD-10-CM | POA: Diagnosis not present

## 2023-07-17 DIAGNOSIS — Z9889 Other specified postprocedural states: Secondary | ICD-10-CM | POA: Diagnosis not present

## 2023-07-17 DIAGNOSIS — I5030 Unspecified diastolic (congestive) heart failure: Secondary | ICD-10-CM | POA: Diagnosis not present

## 2023-07-17 DIAGNOSIS — I209 Angina pectoris, unspecified: Secondary | ICD-10-CM | POA: Diagnosis not present

## 2023-07-17 LAB — GLUCOSE, CAPILLARY
Glucose-Capillary: 305 mg/dL — ABNORMAL HIGH (ref 70–99)
Glucose-Capillary: 249 mg/dL — ABNORMAL HIGH (ref 70–99)
Glucose-Capillary: 189 mg/dL — ABNORMAL HIGH (ref 70–99)
Glucose-Capillary: 162 mg/dL — ABNORMAL HIGH (ref 70–99)
Glucose-Capillary: 107 mg/dL — ABNORMAL HIGH (ref 70–99)
Glucose-Capillary: 155 mg/dL — ABNORMAL HIGH (ref 70–99)
Glucose-Capillary: 161 mg/dL — ABNORMAL HIGH (ref 70–99)

## 2023-07-17 LAB — BASIC METABOLIC PANEL
Anion gap: 9 (ref 5–15)
BUN: 60 mg/dL — ABNORMAL HIGH (ref 6–20)
CO2: 18 mmol/L — ABNORMAL LOW (ref 22–32)
Calcium: 8.4 mg/dL — ABNORMAL LOW (ref 8.9–10.3)
Chloride: 110 mmol/L (ref 98–111)
Creatinine, Ser: 2.41 mg/dL — ABNORMAL HIGH (ref 0.44–1.00)
GFR, Estimated: 22 mL/min — ABNORMAL LOW (ref >60)
Glucose, Bld: 332 mg/dL — ABNORMAL HIGH (ref 70–99)
Potassium: 5.4 mmol/L — ABNORMAL HIGH (ref 3.5–5.1)
Sodium: 137 mmol/L (ref 135–145)

## 2023-07-17 MED ORDER — FUROSEMIDE 10 MG/ML IJ SOLN
40.0000 mg | Freq: Two times a day (BID) | INTRAMUSCULAR | Status: DC
  Administered 2023-07-17 – 2023-07-20 (×6): 40 mg via INTRAVENOUS
  Filled 2023-07-17 (×6): qty 4

## 2023-07-17 MED ORDER — INSULIN ASPART 100 UNIT/ML IJ SOLN
0.0000 [IU] | INTRAMUSCULAR | Status: DC
  Administered 2023-07-17: 1 [IU] via SUBCUTANEOUS
  Administered 2023-07-17: 7 [IU] via SUBCUTANEOUS
  Administered 2023-07-17: 2 [IU] via SUBCUTANEOUS
  Administered 2023-07-18 – 2023-07-19 (×3): 1 [IU] via SUBCUTANEOUS
  Administered 2023-07-19: 2 [IU] via SUBCUTANEOUS
  Administered 2023-07-19: 1 [IU] via SUBCUTANEOUS

## 2023-07-17 MED ORDER — INSULIN GLARGINE-YFGN 100 UNIT/ML ~~LOC~~ SOLN
15.0000 [IU] | Freq: Every day | SUBCUTANEOUS | Status: DC
  Administered 2023-07-17 – 2023-07-20 (×4): 15 [IU] via SUBCUTANEOUS
  Filled 2023-07-17 (×4): qty 0.15

## 2023-07-17 NOTE — Progress Notes (Signed)
 [facility] Progress Note   Assessment/ Plan:   CKD with h/o CIN: - got contrast 12/28--> small rise in Cr possibly d/t that - pt and dtr understand risks/ benefits of more contrast - has had CIN in the past- no dialysis required - s/p cardiac cath yesterday, to go back tomorrow to cath lab for staged procedure - IV Lasix  given today- needs to be able to lay flat for cath tomorrow - continue to monitor renal function closely   2.  Chest pain/NSTEMI             - multiple PCIs   3.  DM             - per primary   4.  Dispo: pending  Subjective:    Seen in room.  Feeling not great today.  Some SOB and LE edema.  Lasix  added back.  Cath yesterday, for staged procedure tomorrow hopefully pending vol status.     Objective:   BP (!) 142/67 (BP Location: Right Arm)   Pulse 67   Temp 97.8 F (36.6 C) (Oral)   Resp 18   Ht 5' 5 (1.651 m)   Wt 110.7 kg   SpO2 98%   BMI 40.62 kg/m   Intake/Output Summary (Last 24 hours) at 07/17/2023 1143 Last data filed at 07/17/2023 1100 Gross per 24 hour  Intake 1378.33 ml  Output --  Net 1378.33 ml   Weight change: 0 kg  Physical Exam: Hzw:[id number] uncomfortable CVS: RRR Resp: slightly increased WOB, + crackles  Abd: soft Ext: 2+ LE edema  Imaging: CARDIAC CATHETERIZATION Result Date: 07/16/2023 Severe restenosis in the proximal LAD stent. There are two layers of stent in this segment (placed in February 2024 and July 2024). The distal LAD is small in caliber. Chronic occlusion mid Circumflex beyond the stented segment. Patent obtuse marginal branch with severe diffuse distal stenosis (too small for PCI) Moderate caliber dominant RCA with moderate ostial/proximal stenosis, patent distal stent. LVEDP=15 mmHg Recommendations: She will need PCI of the proximal LAD stented segment. This PCI will be staged given her advanced kidney disease. Continue IV NTG.   US  RENAL Result Date: 07/15/2023 CLINICAL DATA:  Acute kidney  injury. EXAM: RENAL / URINARY TRACT ULTRASOUND COMPLETE COMPARISON:  None Available. FINDINGS: Right Kidney: Renal measurements: 11.5 cm x 3.7 cm x 4.7 cm = volume: 102.65 mL. Diffusely increased echogenicity of the renal parenchyma is noted. No mass or hydronephrosis visualized. Left Kidney: Renal measurements: 11.1 cm x 4.7 cm x 4.7 cm = volume: 128.04 mL. Diffusely increased echogenicity of the renal parenchyma is noted. No mass or hydronephrosis visualized. Bladder: Appears normal for degree of bladder distention. Other: None. IMPRESSION: Bilateral echogenic kidneys which may represent sequelae associated with medical renal disease. Electronically Signed   By: Suzen Dials M.D.   On: 07/15/2023 21:49   ECHOCARDIOGRAM LIMITED Result Date: 07/15/2023    ECHOCARDIOGRAM LIMITED REPORT   Patient Name:   Dana [name] Date of Exam: 07/15/2023 Medical Rec #:  [id number]      Height:       65.0 in Accession #:    [id number]     Weight:       231.0 lb Date of Birth:  Jul 10, 1961      BSA:          2.103 m Patient Age:    62 years       BP:  141/58 mmHg Patient Gender: F              HR:           71 bpm. Exam Location:  Inpatient Procedure: Limited Echo, Intracardiac Opacification Agent, Limited Color Doppler            and Cardiac Doppler Indications:     CHF  History:         Patient has prior history of Echocardiogram examinations, most                  recent 12/09/2022. CHF, CAD and Previous Myocardial Infarction,                  Mitral Valve Disease; Risk Factors:Dyslipidemia and Former                  Smoker.                   Mitral Valve: Mitra-Clip valve is present in the mitral                  position.  Sonographer:     Tillman Nora RVT RCS Referring Phys:  ALEENE JINNY PASSE Diagnosing Phys: Annabella Scarce MD IMPRESSIONS  1. Left ventricular ejection fraction, by estimation, is 55 to 60%. The left ventricle has normal function. The left ventricle has no regional wall motion abnormalities.  There is mild left ventricular hypertrophy of the lateral segment. Left ventricular  diastolic function could not be evaluated. There is the interventricular septum is flattened in systole, consistent with right ventricular pressure overload.  2. Right ventricular systolic function is normal. The right ventricular size is normal. There is normal pulmonary artery systolic pressure.  3. Left atrial size was moderately dilated.  4. Mitral valve gradient unchanged from echo 11/2022. The mitral valve has been repaired/replaced. Mild mitral valve regurgitation. No evidence of mitral stenosis. The mean mitral valve gradient is 5.0 mmHg. There is a Mitra-Clip present in the mitral position.  5. The aortic valve is normal in structure. Aortic valve regurgitation is not visualized. No aortic stenosis is present.  6. The inferior vena cava is dilated in size with <50% respiratory variability, suggesting right atrial pressure of 15 mmHg. FINDINGS  Left Ventricle: Left ventricular ejection fraction, by estimation, is 55 to 60%. The left ventricle has normal function. The left ventricle has no regional wall motion abnormalities. Definity  contrast agent was given IV to delineate the left ventricular  endocardial borders. The left ventricular internal cavity size was normal in size. There is mild left ventricular hypertrophy of the lateral segment. The interventricular septum is flattened in systole, consistent with right ventricular pressure overload. Left ventricular diastolic function could not be evaluated. Left ventricular diastolic function could not be evaluated due to mitral valve repair. Right Ventricle: The right ventricular size is normal. No increase in right ventricular wall thickness. Right ventricular systolic function is normal. There is normal pulmonary artery systolic pressure. The tricuspid regurgitant velocity is 1.61 m/s, and  with an assumed right atrial pressure of 15 mmHg, the estimated right ventricular  systolic pressure is 25.4 mmHg. Left Atrium: Left atrial size was moderately dilated. Right Atrium: Right atrial size was normal in size. Pericardium: There is no evidence of pericardial effusion. Mitral Valve: Mitral valve gradient unchanged from echo 11/2022. The mitral valve has been repaired/replaced. Mild mitral valve regurgitation. There is a Mitra-Clip present in the mitral position. No evidence of mitral valve  stenosis. MV peak gradient, 18.7 mmHg. The mean mitral valve gradient is 5.0 mmHg with average heart rate of 71 bpm. Tricuspid Valve: The tricuspid valve is normal in structure. Tricuspid valve regurgitation is trivial. No evidence of tricuspid stenosis. Aortic Valve: The aortic valve is normal in structure. Aortic valve regurgitation is not visualized. No aortic stenosis is present. Pulmonic Valve: The pulmonic valve was normal in structure. Pulmonic valve regurgitation is not visualized. No evidence of pulmonic stenosis. Aorta: The aortic root is normal in size and structure. Venous: The inferior vena cava is dilated in size with less than 50% respiratory variability, suggesting right atrial pressure of 15 mmHg. IAS/Shunts: No atrial level shunt detected by color flow Doppler. Additional Comments: Spectral Doppler performed. Color Doppler performed.  LEFT VENTRICLE PLAX 2D LVIDd:         5.00 cm LVIDs:         3.40 cm LV PW:         1.20 cm LV IVS:        1.00 cm LVOT diam:     1.70 cm LVOT Area:     2.27 cm  IVC IVC diam: 2.30 cm LEFT ATRIUM             Index LA diam:        3.20 cm 1.52 cm/m LA Vol (A2C):   85.8 ml 40.79 ml/m LA Vol (A4C):   63.2 ml 30.05 ml/m LA Biplane Vol: 79.3 ml 37.70 ml/m                        PULMONIC VALVE AORTA                 PV Vmax:       0.92 m/s Ao Root diam: 2.70 cm PV Peak grad:  3.4 mmHg Ao Asc diam:  3.40 cm  MITRAL VALVE                TRICUSPID VALVE MV Area (PHT): 3.72 cm     TR Peak grad:   10.4 mmHg MV Peak grad:  18.7 mmHg    TR Vmax:        161.00  cm/s MV Mean grad:  5.0 mmHg MV Vmax:       2.16 m/s     SHUNTS MV Vmean:      97.4 cm/s    Systemic Diam: 1.70 cm MV Decel Time: 204 msec MV E velocity: 221.00 cm/s MV A velocity: 101.00 cm/s MV E/A ratio:  2.19 Annabella Scarce MD Electronically signed by Annabella Scarce MD Signature Date/Time: 07/15/2023/6:24:49 PM    Final (Updated)     Labs: BMET Recent Labs  Lab 07/12/23 1105 07/13/23 1325 07/13/23 1430 07/14/23 0512 07/15/23 1001 07/16/23 0248 07/17/23 0307  NA 139 134* 138 137 138 138 137  K 4.4 4.3 4.2 3.9 4.5 4.7 5.4*  CL 100 101  --  106 110 110 110  CO2 22 22  --  22 21* 19* 18*  GLUCOSE 162* 328*  --  123* 72 73 332*  BUN 70* 67*  --  65* 69* 68* 60*  CREATININE 2.03* 1.83*  --  2.11* 2.76* 2.65* 2.41*  CALCIUM  9.2 8.7*  --  8.5* 8.7* 8.4* 8.4*   CBC Recent Labs  Lab 07/12/23 1105 07/13/23 1325 07/13/23 1430 07/14/23 0512 07/15/23 1001  WBC 11.0* 14.6*  --  10.0 9.9  NEUTROABS 8.0*  --   --   --   --  HGB 9.9* 10.1* 11.2* 9.3* 9.2*  HCT 31.5* 32.1* 33.0* 29.6* 29.5*  MCV 88 87.7  --  88.6 89.1  PLT 256 250  --  239 237    Medications:     allopurinol   50 mg Oral Daily   aspirin  EC  81 mg Oral Daily   busPIRone   10 mg Oral TID   carvedilol   12.5 mg Oral BID   cefadroxil   500 mg Oral Daily   enoxaparin  (LOVENOX ) injection  40 mg Subcutaneous Q24H   ezetimibe   10 mg Oral QPM   furosemide   40 mg Intravenous BID   gabapentin   100 mg Oral BID   insulin  aspart  0-9 Units Subcutaneous Q4H   insulin  glargine-yfgn  15 Units Subcutaneous Daily   latanoprost   1 drop Both Eyes QHS   magnesium  oxide  800 mg Oral Daily   omega-3 acid ethyl esters  1 g Oral Daily   pantoprazole   40 mg Oral Daily   sodium chloride  flush  3 mL Intravenous Q12H   ticagrelor   90 mg Oral BID    Almarie Bonine MD 07/17/2023, 11:43 AM

## 2023-07-17 NOTE — Progress Notes (Signed)
 PROGRESS NOTE Dana [name]  FMW:[id number] DOB: 07-Jan-1961 DOA: 07/13/2023 PCP: Sherre Clapper, MD  Brief Narrative/Hospital Course:  anxiety, chronic back pain, CKD 3B, OSA on CPAP, COPD, diastolic HF, HTN, HLD, gout, GERD, T1DM, and multivessel CAD/NSTEMI s/p multiple PCI's and complications including contrast-induced nephropathy who presents with chest pain  In ED, vitals stable afebrile, labs with creatinine 2.0 BUN 70 hemoglobin 9.9 g, troponin elevated at 21-31 flat, cardiology consulted and admitted for angina. EKG without ischemic changes.  Patient was placed in nitro drip with plan for cardiac cath on weekday> but due to elevated creatinine 12/30 cath postponed pending improvement in renal function.  12/31>Blood sugar dropped in 65 but asymptomatic,semglee  decreased from 40u to 30 u> was treated insulin  discontinued blood sugar as low as 39 needing IV dextrose  and dextrose  IV fluid. 12/31> cath showed severe restenosis in the proximal LAD chronic occlusion mid circumflex, recommended PCI of the proximal LAD, PCI will be staged given her advanced kidney disease> continued on IV nitroglycerin  gtt    Subjective: Patient seen and examined Overnight patient became dyspneic and having CHF exacerbation, currently off fluids Denies chest pain. She remains on 2-3 to nasal cannula, afebrile BP stable Hypoglycemia resolved and blood sugar trending up Labs shows potassium slightly of 5.0 creatinine down to 0.4  Assessment and Plan: Principal Problem:   Angina pectoris (HCC) Active Problems:   Hyperglycemia  Angina CAD with multivessel disease, prior history of NSTEMI status post multiple PCI : Her chest pain was similar to her previous angina with relief from nitroglycerin .  Nephro was consulted prior to undergoing cardiac cath given patient's renal dysfunction> hydrated with IV fluids.12/31> cath showed severe restenosis in the proximal LAD chronic occlusion mid circumflex, recommended PCI of  the proximal LAD, PCI will be staged given her advanced kidney disease> continued on IV nitroglycerin  gtt. continue aspirin  81, Brilinta , Coreg  12.5 twice daily, GTI.  Awaiting PCI once CHF better likely tomorrow.  Klebsiella pneumonia UTI POA: Continue Duricef 500 daily renal dose    Type 1 diabetes mellitus on insulin  with hypoglycemia: A1c up to 9 ,2 months ago, PTA on 60 units Lantus  w/ intermittent hypoglycemia at home In 67s- and was taking 40u only.  Patient had hypoglycemia episode up to 39 despite decreasing Lantus  and subsequently discontinued 12/31 also needed dextrose  drip and push. CBG on higher side now-will start semaglutide 15u daily-keep ion ssi only.  Recent Labs  Lab 07/16/23 1238 07/16/23 1606 07/16/23 2109 07/17/23 0625 07/17/23 0830  GLUCAP 147* 137* 262* 305* 249*    AKI on CKD 3B vs new baseline Hyperkalemia: Baseline creatinine fluctuating as below, on admission 2.1-level fluctuating in mid 2s Has had no hypotension or nephrotoxic agent. Torsemide  on hold, received IV fluids precath.Renal ultrasound shows medical renal disease. Nephrology has been involved and underwent cardiac cath aware that patient may end up in dialysis.  Now with CHF and Lasix  ordered.Monitor potassium Avoid nephrotoxic medications including NSAIDs and iodinated intravenous contrast exposure unless the latter is absolutely indicated.Preferred narcotic agents for pain control are:hydromorphone , fentanyl , and methadone and avoid morphine .Avoid Baclofen and avoid oral sodium phosphate  and magnesium  citrate based laxatives / bowel preps. Continue strict Input and Output monitoring and serial renal functions.  Recent Labs    02/20/23 0252 02/27/23 1418 04/04/23 1122 05/14/23 1123 07/12/23 1105 07/13/23 1325 07/13/23 1430 07/14/23 0512 07/15/23 1001 07/16/23 0248 07/17/23 0307  BUN 45* 57* 59* 60* 70* 67*  --  65* 69* 68* 60*  CREATININE 2.54* 2.18*  2.04* 2.23* 2.03* 1.83*  --  2.11* 2.76*  2.65* 2.41*  CO2 22 20 22 24 22 22   --  22 21* 19* 18*  K 4.4 4.3 4.5 4.5 4.4 4.3 4.2 3.9 4.5 4.7 5.4*     Acute on chronic HFpEF MR s/p MitraClip Acute hypoxic respiratory failure due to CHF exacerbation: Patient needed fluid precath but having dyspnea shortness of breath overnight iv Lasix  40 mg twice daily started per cardiology appreciate input.  Continue monitor intake output Daily weight.Cont her Coreg , compression stockings.  Continue supplemental oxygen  and wean as tolerated.  GDMT per cardiology once volume improves. Net IO Since Admission: 4,686.95 mL [07/17/23 0939]    HLD: Continue Zetia  and Repatha    Chronic back pain  Continue home Norco and gabapentin   Normocytic anemia-likely from anemia of CKD: B/l hb 8 to 9 g range and remains stable.  Monitor   OSA: Continue bedtime CPAP an [place] 02  Obesity: Patient's Body mass index is 40.62 kg/m. : Will benefit with PCP follow-up, weight loss,healthy lifestyle   DVT prophylaxis: enoxaparin  (LOVENOX ) injection 40 mg Start: 07/17/23 0800 Code Status:   Code Status: Full Code Family Communication: plan of care discussed with patient no family at bedside today. Patient status is: Remains hospitalized because of severity of illness Level of care: Telemetry Cardiac   Dispo: The patient is from: home with family            Anticipated disposition: TBD Objective: Vitals last 24 hrs: Vitals:   07/17/23 0428 07/17/23 0606 07/17/23 0745 07/17/23 0800  BP: (!) 142/66  (!) 142/67 (!) 142/67  Pulse: 81  72 67  Resp: 18  18 18   Temp: 98 F (36.7 C)  97.8 F (36.6 C)   TempSrc: Oral  Oral Oral  SpO2: 93%  97% 98%  Weight:  110.7 kg    Height:       Weight change: 0 kg  Physical Examination: General exam: alert awake, dyspneic  o nNC HEENT:Oral mucosa moist, Ear/Nose WNL grossly Respiratory system: Bilaterally crackles up to mid lung posteriorly,no use of accessory muscle Cardiovascular system: S1 & S2 +, No  JVD. Gastrointestinal system: Abdomen soft,NT,ND, BS+ Nervous System: Alert, awake, moving all extremities,and following commands. Extremities: LE edema  +,distal peripheral pulses palpable and warm.  Skin: No rashes,no icterus. MSK: Normal muscle bulk,tone, power   Medications reviewed:  Scheduled Meds:  allopurinol   50 mg Oral Daily   aspirin  EC  81 mg Oral Daily   busPIRone   10 mg Oral TID   carvedilol   12.5 mg Oral BID   cefadroxil   500 mg Oral Daily   enoxaparin  (LOVENOX ) injection  40 mg Subcutaneous Q24H   ezetimibe   10 mg Oral QPM   furosemide   40 mg Intravenous BID   gabapentin   100 mg Oral BID   insulin  aspart  0-9 Units Subcutaneous Q4H   latanoprost   1 drop Both Eyes QHS   magnesium  oxide  800 mg Oral Daily   omega-3 acid ethyl esters  1 g Oral Daily   pantoprazole   40 mg Oral Daily   sodium chloride  flush  3 mL Intravenous Q12H   ticagrelor   90 mg Oral BID   Continuous Infusions:  sodium chloride      nitroGLYCERIN  10 mcg/min (07/17/23 0519)      Diet Order             Diet Carb Modified Fluid consistency: Thin; Room service appropriate? Yes  Diet effective now  Intake/Output Summary (Last 24 hours) at 07/17/2023 0939 Last data filed at 07/17/2023 0858 Gross per 24 hour  Intake 1258.33 ml  Output --  Net 1258.33 ml   Net IO Since Admission: 4,686.95 mL [07/17/23 0939]  Wt Readings from Last 3 Encounters:  07/17/23 110.7 kg  07/12/23 105.2 kg  05/14/23 104.7 kg     Unresulted Labs (From admission, onward)     Start     Ordered   07/16/23 0500  Basic metabolic panel  Daily,   R      07/15/23 0943          Data Reviewed: I have personally reviewed following labs and imaging studies CBC: Recent Labs  Lab 07/12/23 1105 07/13/23 1325 07/13/23 1430 07/14/23 0512 07/15/23 1001  WBC 11.0* 14.6*  --  10.0 9.9  NEUTROABS 8.0*  --   --   --   --   HGB 9.9* 10.1* 11.2* 9.3* 9.2*  HCT 31.5* 32.1* 33.0* 29.6* 29.5*  MCV 88 87.7   --  88.6 89.1  PLT 256 250  --  239 237   Basic Metabolic Panel:  Recent Labs  Lab 07/13/23 1325 07/13/23 1430 07/14/23 0512 07/15/23 1001 07/16/23 0248 07/17/23 0307  NA 134* 138 137 138 138 137  K 4.3 4.2 3.9 4.5 4.7 5.4*  CL 101  --  106 110 110 110  CO2 22  --  22 21* 19* 18*  GLUCOSE 328*  --  123* 72 73 332*  BUN 67*  --  65* 69* 68* 60*  CREATININE 1.83*  --  2.11* 2.76* 2.65* 2.41*  CALCIUM  8.7*  --  8.5* 8.7* 8.4* 8.4*   GFR: Estimated Creatinine Clearance: 30.8 mL/min (A) (by C-G formula based on SCr of 2.41 mg/dL (H)). Liver Function Tests:  Recent Labs  Lab 07/12/23 1105  AST 21  ALT 25  ALKPHOS 226*  BILITOT 0.5  PROT 7.3  ALBUMIN  4.0   Recent Labs  Lab 07/16/23 1238 07/16/23 1606 07/16/23 2109 07/17/23 0625 07/17/23 0830  GLUCAP 147* 137* 262* 305* 249*  Sepsis Labs: No results for input(s): PROCALCITON, LATICACIDVEN in the last 168 hours. Recent Results (from the past 240 hours)  Urine Culture     Status: Abnormal   Collection Time: 07/13/23  4:45 PM   Specimen: Urine, Random  Result Value Ref Range Status   Specimen Description URINE, RANDOM  Final   Special Requests   Final    NONE Reflexed from [id number] Performed at [facility], [address] [place], [place] [address]    Culture >=100,000 COLONIES/mL KLEBSIELLA PNEUMONIAE (A)  Final   Report Status 07/15/2023 FINAL  Final   Organism ID, Bacteria KLEBSIELLA PNEUMONIAE (A)  Final      Susceptibility   Klebsiella pneumoniae - MIC*    AMPICILLIN >=32 RESISTANT Resistant     CEFAZOLIN  <=4 SENSITIVE Sensitive     CEFEPIME <=0.12 SENSITIVE Sensitive     CEFTRIAXONE  <=0.25 SENSITIVE Sensitive     CIPROFLOXACIN  <=0.25 SENSITIVE Sensitive     GENTAMICIN <=1 SENSITIVE Sensitive     IMIPENEM <=0.25 SENSITIVE Sensitive     NITROFURANTOIN  64 INTERMEDIATE Intermediate     TRIMETH /SULFA  <=20 SENSITIVE Sensitive     AMPICILLIN/SULBACTAM 8 SENSITIVE Sensitive     PIP/TAZO <=4 SENSITIVE  Sensitive ug/mL    * >=100,000 COLONIES/mL KLEBSIELLA PNEUMONIAE    Antimicrobials/Microbiology: Anti-infectives (From admission, onward)    Start     Dose/Rate Route Frequency Ordered Stop  07/15/23 1800  cefadroxil  (DURICEF) capsule 500 mg        500 mg Oral Daily 07/15/23 1000 07/18/23 1759   07/14/23 1600  cefTRIAXone  (ROCEPHIN ) 1 g in sodium chloride  0.9 % 100 mL IVPB  Status:  Discontinued        1 g 200 mL/hr over 30 Minutes Intravenous Every 24 hours 07/13/23 2039 07/15/23 1000   07/13/23 1730  cefTRIAXone  (ROCEPHIN ) 1 g in sodium chloride  0.9 % 100 mL IVPB        1 g 200 mL/hr over 30 Minutes Intravenous  Once 07/13/23 1726 07/13/23 1916         Component Value Date/Time   SDES URINE, RANDOM 07/13/2023 1645   SPECREQUEST  07/13/2023 1645    NONE Reflexed from [id number] Performed at [facility], [address], [place], [place] [address]    CULT >=100,000 COLONIES/mL KLEBSIELLA PNEUMONIAE (A) 07/13/2023 1645   REPTSTATUS 07/15/2023 FINAL 07/13/2023 1645     Radiology Studies: CARDIAC CATHETERIZATION Result Date: 07/16/2023 Severe restenosis in the proximal LAD stent. There are two layers of stent in this segment (placed in February 2024 and July 2024). The distal LAD is small in caliber. Chronic occlusion mid Circumflex beyond the stented segment. Patent obtuse marginal branch with severe diffuse distal stenosis (too small for PCI) Moderate caliber dominant RCA with moderate ostial/proximal stenosis, patent distal stent. LVEDP=15 mmHg Recommendations: She will need PCI of the proximal LAD stented segment. This PCI will be staged given her advanced kidney disease. Continue IV NTG.   US  RENAL Result Date: 07/15/2023 CLINICAL DATA:  Acute kidney injury. EXAM: RENAL / URINARY TRACT ULTRASOUND COMPLETE COMPARISON:  None Available. FINDINGS: Right Kidney: Renal measurements: 11.5 cm x 3.7 cm x 4.7 cm = volume: 102.65 mL. Diffusely increased echogenicity of the renal  parenchyma is noted. No mass or hydronephrosis visualized. Left Kidney: Renal measurements: 11.1 cm x 4.7 cm x 4.7 cm = volume: 128.04 mL. Diffusely increased echogenicity of the renal parenchyma is noted. No mass or hydronephrosis visualized. Bladder: Appears normal for degree of bladder distention. Other: None. IMPRESSION: Bilateral echogenic kidneys which may represent sequelae associated with medical renal disease. Electronically Signed   By: Suzen Dials M.D.   On: 07/15/2023 21:49   ECHOCARDIOGRAM LIMITED Result Date: 07/15/2023    ECHOCARDIOGRAM LIMITED REPORT   Patient Name:   Dana [name] Date of Exam: 07/15/2023 Medical Rec #:  [id number]      Height:       65.0 in Accession #:    [id number]     Weight:       231.0 lb Date of Birth:  03-27-1961      BSA:          2.103 m Patient Age:    62 years       BP:           141/58 mmHg Patient Gender: F              HR:           71 bpm. Exam Location:  Inpatient Procedure: Limited Echo, Intracardiac Opacification Agent, Limited Color Doppler            and Cardiac Doppler Indications:     CHF  History:         Patient has prior history of Echocardiogram examinations, most                  recent 12/09/2022. CHF,  CAD and Previous Myocardial Infarction,                  Mitral Valve Disease; Risk Factors:Dyslipidemia and Former                  Smoker.                   Mitral Valve: Mitra-Clip valve is present in the mitral                  position.  Sonographer:     Tillman Nora RVT RCS Referring Phys:  ALEENE JINNY PASSE Diagnosing Phys: Annabella Scarce MD IMPRESSIONS  1. Left ventricular ejection fraction, by estimation, is 55 to 60%. The left ventricle has normal function. The left ventricle has no regional wall motion abnormalities. There is mild left ventricular hypertrophy of the lateral segment. Left ventricular  diastolic function could not be evaluated. There is the interventricular septum is flattened in systole, consistent with right ventricular  pressure overload.  2. Right ventricular systolic function is normal. The right ventricular size is normal. There is normal pulmonary artery systolic pressure.  3. Left atrial size was moderately dilated.  4. Mitral valve gradient unchanged from echo 11/2022. The mitral valve has been repaired/replaced. Mild mitral valve regurgitation. No evidence of mitral stenosis. The mean mitral valve gradient is 5.0 mmHg. There is a Mitra-Clip present in the mitral position.  5. The aortic valve is normal in structure. Aortic valve regurgitation is not visualized. No aortic stenosis is present.  6. The inferior vena cava is dilated in size with <50% respiratory variability, suggesting right atrial pressure of 15 mmHg. FINDINGS  Left Ventricle: Left ventricular ejection fraction, by estimation, is 55 to 60%. The left ventricle has normal function. The left ventricle has no regional wall motion abnormalities. Definity  contrast agent was given IV to delineate the left ventricular  endocardial borders. The left ventricular internal cavity size was normal in size. There is mild left ventricular hypertrophy of the lateral segment. The interventricular septum is flattened in systole, consistent with right ventricular pressure overload. Left ventricular diastolic function could not be evaluated. Left ventricular diastolic function could not be evaluated due to mitral valve repair. Right Ventricle: The right ventricular size is normal. No increase in right ventricular wall thickness. Right ventricular systolic function is normal. There is normal pulmonary artery systolic pressure. The tricuspid regurgitant velocity is 1.61 m/s, and  with an assumed right atrial pressure of 15 mmHg, the estimated right ventricular systolic pressure is 25.4 mmHg. Left Atrium: Left atrial size was moderately dilated. Right Atrium: Right atrial size was normal in size. Pericardium: There is no evidence of pericardial effusion. Mitral Valve: Mitral valve  gradient unchanged from echo 11/2022. The mitral valve has been repaired/replaced. Mild mitral valve regurgitation. There is a Mitra-Clip present in the mitral position. No evidence of mitral valve stenosis. MV peak gradient, 18.7 mmHg. The mean mitral valve gradient is 5.0 mmHg with average heart rate of 71 bpm. Tricuspid Valve: The tricuspid valve is normal in structure. Tricuspid valve regurgitation is trivial. No evidence of tricuspid stenosis. Aortic Valve: The aortic valve is normal in structure. Aortic valve regurgitation is not visualized. No aortic stenosis is present. Pulmonic Valve: The pulmonic valve was normal in structure. Pulmonic valve regurgitation is not visualized. No evidence of pulmonic stenosis. Aorta: The aortic root is normal in size and structure. Venous: The inferior vena cava is dilated in size with less than  50% respiratory variability, suggesting right atrial pressure of 15 mmHg. IAS/Shunts: No atrial level shunt detected by color flow Doppler. Additional Comments: Spectral Doppler performed. Color Doppler performed.  LEFT VENTRICLE PLAX 2D LVIDd:         5.00 cm LVIDs:         3.40 cm LV PW:         1.20 cm LV IVS:        1.00 cm LVOT diam:     1.70 cm LVOT Area:     2.27 cm  IVC IVC diam: 2.30 cm LEFT ATRIUM             Index LA diam:        3.20 cm 1.52 cm/m LA Vol (A2C):   85.8 ml 40.79 ml/m LA Vol (A4C):   63.2 ml 30.05 ml/m LA Biplane Vol: 79.3 ml 37.70 ml/m                        PULMONIC VALVE AORTA                 PV Vmax:       0.92 m/s Ao Root diam: 2.70 cm PV Peak grad:  3.4 mmHg Ao Asc diam:  3.40 cm  MITRAL VALVE                TRICUSPID VALVE MV Area (PHT): 3.72 cm     TR Peak grad:   10.4 mmHg MV Peak grad:  18.7 mmHg    TR Vmax:        161.00 cm/s MV Mean grad:  5.0 mmHg MV Vmax:       2.16 m/s     SHUNTS MV Vmean:      97.4 cm/s    Systemic Diam: 1.70 cm MV Decel Time: 204 msec MV E velocity: 221.00 cm/s MV A velocity: 101.00 cm/s MV E/A ratio:  2.19 Annabella Scarce MD Electronically signed by Annabella Scarce MD Signature Date/Time: 07/15/2023/6:24:49 PM    Final (Updated)     LOS: 4 days   Total time spent in review of labs and imaging, patient evaluation, formulation of plan, documentation and communication with family: 50 minutes  Mennie LAMY, MD [facility]  07/17/2023, 9:39 AM

## 2023-07-17 NOTE — Progress Notes (Addendum)
 Cardiologist:  Bensimohn  Subjective:  More dyspnic this am. Active CHF with severe PND/orthopnea last night   Objective:  Vitals:   07/16/23 2214 07/17/23 0041 07/17/23 0428 07/17/23 0606  BP: (!) 144/83 (!) 156/66 (!) 142/66   Pulse: 87 87 81   Resp:  19 18   Temp:  99.3 F (37.4 C) 98 F (36.7 C)   TempSrc:  Oral Oral   SpO2:  91% 93%   Weight:    110.7 kg  Height:        Intake/Output from previous day:  Intake/Output Summary (Last 24 hours) at 07/17/2023 0813 Last data filed at 07/16/2023 2213 Gross per 24 hour  Intake 1138.33 ml  Output --  Net 1138.33 ml    Physical Exam: Overweight white female More tachypnic this am  Lungs bilateral rales 1/2 way  Apical MR murmur  Abdomen benign Plus one bilateral edema Left great toa and 4 th toe amputation   Lab Results: Basic Metabolic Panel: Recent Labs    07/16/23 0248 07/17/23 0307  NA 138 137  K 4.7 5.4*  CL 110 110  CO2 19* 18*  GLUCOSE 73 332*  BUN 68* 60*  CREATININE 2.65* 2.41*  CALCIUM  8.4* 8.4*   Liver Function Tests: No results for input(s): AST, ALT, ALKPHOS, BILITOT, PROT, ALBUMIN  in the last 72 hours.  No results for input(s): LIPASE, AMYLASE in the last 72 hours. CBC: Recent Labs    07/15/23 1001  WBC 9.9  HGB 9.2*  HCT 29.5*  MCV 89.1  PLT 237    Fasting Lipid Panel: No results for input(s): CHOL, HDL, LDLCALC, TRIG, CHOLHDL, LDLDIRECT in the last 72 hours.    Imaging: CARDIAC CATHETERIZATION Result Date: 07/16/2023 Severe restenosis in the proximal LAD stent. There are two layers of stent in this segment (placed in February 2024 and July 2024). The distal LAD is small in caliber. Chronic occlusion mid Circumflex beyond the stented segment. Patent obtuse marginal branch with severe diffuse distal stenosis (too small for PCI) Moderate caliber dominant RCA with moderate ostial/proximal stenosis, patent distal stent. LVEDP=15 mmHg Recommendations: She  will need PCI of the proximal LAD stented segment. This PCI will be staged given her advanced kidney disease. Continue IV NTG.   US  RENAL Result Date: 07/15/2023 CLINICAL DATA:  Acute kidney injury. EXAM: RENAL / URINARY TRACT ULTRASOUND COMPLETE COMPARISON:  None Available. FINDINGS: Right Kidney: Renal measurements: 11.5 cm x 3.7 cm x 4.7 cm = volume: 102.65 mL. Diffusely increased echogenicity of the renal parenchyma is noted. No mass or hydronephrosis visualized. Left Kidney: Renal measurements: 11.1 cm x 4.7 cm x 4.7 cm = volume: 128.04 mL. Diffusely increased echogenicity of the renal parenchyma is noted. No mass or hydronephrosis visualized. Bladder: Appears normal for degree of bladder distention. Other: None. IMPRESSION: Bilateral echogenic kidneys which may represent sequelae associated with medical renal disease. Electronically Signed   By: Suzen Dials M.D.   On: 07/15/2023 21:49   ECHOCARDIOGRAM LIMITED Result Date: 07/15/2023    ECHOCARDIOGRAM LIMITED REPORT   Patient Name:   NATASA STIGALL Date of Exam: 07/15/2023 Medical Rec #:  [id number]      Height:       65.0 in Accession #:    [id number]     Weight:       231.0 lb Date of Birth:  30-Aug-1960      BSA:          2.103 m Patient Age:  62 years       BP:           141/58 mmHg Patient Gender: F              HR:           71 bpm. Exam Location:  Inpatient Procedure: Limited Echo, Intracardiac Opacification Agent, Limited Color Doppler            and Cardiac Doppler Indications:     CHF  History:         Patient has prior history of Echocardiogram examinations, most                  recent 12/09/2022. CHF, CAD and Previous Myocardial Infarction,                  Mitral Valve Disease; Risk Factors:Dyslipidemia and Former                  Smoker.                   Mitral Valve: Mitra-Clip valve is present in the mitral                  position.  Sonographer:     Tillman Nora RVT RCS Referring Phys:  ALEENE JINNY PASSE Diagnosing Phys: Annabella Scarce MD IMPRESSIONS  1. Left ventricular ejection fraction, by estimation, is 55 to 60%. The left ventricle has normal function. The left ventricle has no regional wall motion abnormalities. There is mild left ventricular hypertrophy of the lateral segment. Left ventricular  diastolic function could not be evaluated. There is the interventricular septum is flattened in systole, consistent with right ventricular pressure overload.  2. Right ventricular systolic function is normal. The right ventricular size is normal. There is normal pulmonary artery systolic pressure.  3. Left atrial size was moderately dilated.  4. Mitral valve gradient unchanged from echo 11/2022. The mitral valve has been repaired/replaced. Mild mitral valve regurgitation. No evidence of mitral stenosis. The mean mitral valve gradient is 5.0 mmHg. There is a Mitra-Clip present in the mitral position.  5. The aortic valve is normal in structure. Aortic valve regurgitation is not visualized. No aortic stenosis is present.  6. The inferior vena cava is dilated in size with <50% respiratory variability, suggesting right atrial pressure of 15 mmHg. FINDINGS  Left Ventricle: Left ventricular ejection fraction, by estimation, is 55 to 60%. The left ventricle has normal function. The left ventricle has no regional wall motion abnormalities. Definity  contrast agent was given IV to delineate the left ventricular  endocardial borders. The left ventricular internal cavity size was normal in size. There is mild left ventricular hypertrophy of the lateral segment. The interventricular septum is flattened in systole, consistent with right ventricular pressure overload. Left ventricular diastolic function could not be evaluated. Left ventricular diastolic function could not be evaluated due to mitral valve repair. Right Ventricle: The right ventricular size is normal. No increase in right ventricular wall thickness. Right ventricular systolic function is  normal. There is normal pulmonary artery systolic pressure. The tricuspid regurgitant velocity is 1.61 m/s, and  with an assumed right atrial pressure of 15 mmHg, the estimated right ventricular systolic pressure is 25.4 mmHg. Left Atrium: Left atrial size was moderately dilated. Right Atrium: Right atrial size was normal in size. Pericardium: There is no evidence of pericardial effusion. Mitral Valve: Mitral valve gradient unchanged from echo 11/2022. The mitral valve has been  repaired/replaced. Mild mitral valve regurgitation. There is a Mitra-Clip present in the mitral position. No evidence of mitral valve stenosis. MV peak gradient, 18.7 mmHg. The mean mitral valve gradient is 5.0 mmHg with average heart rate of 71 bpm. Tricuspid Valve: The tricuspid valve is normal in structure. Tricuspid valve regurgitation is trivial. No evidence of tricuspid stenosis. Aortic Valve: The aortic valve is normal in structure. Aortic valve regurgitation is not visualized. No aortic stenosis is present. Pulmonic Valve: The pulmonic valve was normal in structure. Pulmonic valve regurgitation is not visualized. No evidence of pulmonic stenosis. Aorta: The aortic root is normal in size and structure. Venous: The inferior vena cava is dilated in size with less than 50% respiratory variability, suggesting right atrial pressure of 15 mmHg. IAS/Shunts: No atrial level shunt detected by color flow Doppler. Additional Comments: Spectral Doppler performed. Color Doppler performed.  LEFT VENTRICLE PLAX 2D LVIDd:         5.00 cm LVIDs:         3.40 cm LV PW:         1.20 cm LV IVS:        1.00 cm LVOT diam:     1.70 cm LVOT Area:     2.27 cm  IVC IVC diam: 2.30 cm LEFT ATRIUM             Index LA diam:        3.20 cm 1.52 cm/m LA Vol (A2C):   85.8 ml 40.79 ml/m LA Vol (A4C):   63.2 ml 30.05 ml/m LA Biplane Vol: 79.3 ml 37.70 ml/m                        PULMONIC VALVE AORTA                 PV Vmax:       0.92 m/s Ao Root diam: 2.70 cm PV  Peak grad:  3.4 mmHg Ao Asc diam:  3.40 cm  MITRAL VALVE                TRICUSPID VALVE MV Area (PHT): 3.72 cm     TR Peak grad:   10.4 mmHg MV Peak grad:  18.7 mmHg    TR Vmax:        161.00 cm/s MV Mean grad:  5.0 mmHg MV Vmax:       2.16 m/s     SHUNTS MV Vmean:      97.4 cm/s    Systemic Diam: 1.70 cm MV Decel Time: 204 msec MV E velocity: 221.00 cm/s MV A velocity: 101.00 cm/s MV E/A ratio:  2.19 Annabella Scarce MD Electronically signed by Annabella Scarce MD Signature Date/Time: 07/15/2023/6:24:49 PM    Final (Updated)     Cardiac Studies:  ECG: NSR no acute ST changes    Telemetry:  NSR 07/17/2023   Echo: 07/15/23 EF 50-55% post mitral clip mild residual MR   Medications:    allopurinol   50 mg Oral Daily   aspirin  EC  81 mg Oral Daily   busPIRone   10 mg Oral TID   carvedilol   12.5 mg Oral BID   cefadroxil   500 mg Oral Daily   enoxaparin  (LOVENOX ) injection  40 mg Subcutaneous Q24H   ezetimibe   10 mg Oral QPM   gabapentin   100 mg Oral BID   insulin  aspart  0-9 Units Subcutaneous Q4H   latanoprost   1 drop Both Eyes QHS   magnesium  oxide  800 mg Oral Daily   omega-3 acid ethyl esters  1 g Oral Daily   pantoprazole   40 mg Oral Daily   sodium chloride  flush  3 mL Intravenous Q12H   ticagrelor   90 mg Oral BID      sodium chloride      nitroGLYCERIN  10 mcg/min (07/17/23 0519)    Assessment/Plan:   Angina:  pain similar to her angina and relief with nitroglycerin . ECG non acute and troponin negative Continue home meds including DAT. No heparin  as troponin negative Cath with limited dye yesterday with LAD restenosis for staged intervention tomorrow See below regarding renal function and CHF    Shared Decision Making/Informed Consent The risks [stroke (1 in 1000), death (1 in 1000), kidney failure [usually temporary] (1 in 500), bleeding (1 in 200), allergic reaction [possibly serious] (1 in 200)], benefits (diagnostic support and management of coronary artery disease) and  alternatives of a cardiac catheterization were discussed in detail with Ms. Sammye and she is willing to proceed.   2.  HLD:  continue repatha  and zetia    3.  DM:  per hospitalist service SS insulin    4. Mitral Clip: Done 05/18/21 mild residual MR no need to repeat TTE this admission   5. UTI:  on oral cefadroxil   6. CHF: In active CHF this am with bilateral rales plus one LE edema and significant PND/Orthopnea last night Cr 2.4 this am stable She cannot have cath in am if she can't lay flat Has been hydrated for renal protection up over a liter. Will give iv lasix  40 mg iv bid today. Can check EDP with pigtail during cath She has cardiomems device consider having CHF team see in am Limited echo this admission with EF 60-65% mild residual MR post clip stable Continue iv nitroglycerin  for preload reduction    7. CRF: Cr stable had bump to 2.7 from CTA contrast. Nephrology has seen US  with no hydronephrosis Check BMET in am with diuresis   Maude Emmer 07/17/2023, 8:13 AM

## 2023-07-17 NOTE — Progress Notes (Signed)
 Patient complained she's having trouble breathing. She described some upward pressure pressing her diaphragm and lungs the same as a pregnant woman with a large baby. RT called and cpap placed. MD notified. Will continue to monitor.

## 2023-07-17 NOTE — Plan of Care (Signed)
  Problem: Coping: Goal: Ability to adjust to condition or change in health will improve Outcome: Progressing   Problem: Fluid Volume: Goal: Ability to maintain a balanced intake and output will improve Outcome: Progressing   Problem: Health Behavior/Discharge Planning: Goal: Ability to manage health-related needs will improve Outcome: Progressing

## 2023-07-18 ENCOUNTER — Encounter (HOSPITAL_COMMUNITY)
Admission: EM | Disposition: A | Discharge status: Home / Self Care | Payer: Self-pay | Source: Home / Self Care | Attending: Internal Medicine

## 2023-07-18 ENCOUNTER — Encounter (HOSPITAL_COMMUNITY): Payer: Self-pay | Admitting: Cardiovascular Disease

## 2023-07-18 DIAGNOSIS — E782 Mixed hyperlipidemia: Secondary | ICD-10-CM | POA: Diagnosis not present

## 2023-07-18 DIAGNOSIS — I25110 Atherosclerotic heart disease of native coronary artery with unstable angina pectoris: Secondary | ICD-10-CM | POA: Diagnosis not present

## 2023-07-18 DIAGNOSIS — I209 Angina pectoris, unspecified: Secondary | ICD-10-CM | POA: Diagnosis not present

## 2023-07-18 HISTORY — PX: CORONARY BALLOON ANGIOPLASTY: CATH118233

## 2023-07-18 LAB — GLUCOSE, CAPILLARY
Glucose-Capillary: 105 mg/dL — ABNORMAL HIGH (ref 70–99)
Glucose-Capillary: 121 mg/dL — ABNORMAL HIGH (ref 70–99)
Glucose-Capillary: 130 mg/dL — ABNORMAL HIGH (ref 70–99)
Glucose-Capillary: 136 mg/dL — ABNORMAL HIGH (ref 70–99)
Glucose-Capillary: 77 mg/dL (ref 70–99)
Glucose-Capillary: 81 mg/dL (ref 70–99)
Glucose-Capillary: 92 mg/dL (ref 70–99)

## 2023-07-18 LAB — BASIC METABOLIC PANEL
Anion gap: 10 (ref 5–15)
BUN: 65 mg/dL — ABNORMAL HIGH (ref 6–20)
CO2: 22 mmol/L (ref 22–32)
Calcium: 8.6 mg/dL — ABNORMAL LOW (ref 8.9–10.3)
Chloride: 106 mmol/L (ref 98–111)
Creatinine, Ser: 2.76 mg/dL — ABNORMAL HIGH (ref 0.44–1.00)
GFR, Estimated: 19 mL/min — ABNORMAL LOW (ref >60)
Glucose, Bld: 146 mg/dL — ABNORMAL HIGH (ref 70–99)
Potassium: 4.2 mmol/L (ref 3.5–5.1)
Sodium: 138 mmol/L (ref 135–145)

## 2023-07-18 LAB — POCT ACTIVATED CLOTTING TIME
Activated Clotting Time: 348 s
Activated Clotting Time: 222 s
Activated Clotting Time: 193 s
Activated Clotting Time: 181 s

## 2023-07-18 SURGERY — CORONARY BALLOON ANGIOPLASTY
Anesthesia: LOCAL

## 2023-07-18 MED ORDER — FENTANYL CITRATE (PF) 100 MCG/2ML IJ SOLN
INTRAMUSCULAR | Status: DC | PRN
  Administered 2023-07-18 (×2): 25 ug via INTRAVENOUS

## 2023-07-18 MED ORDER — SODIUM CHLORIDE 0.9 % IV SOLN: 250.0000 mL | INTRAVENOUS | Status: AC | PRN

## 2023-07-18 MED ORDER — HYDROCODONE-ACETAMINOPHEN 5-325 MG PO TABS
ORAL_TABLET | ORAL | Status: AC
  Filled 2023-07-18: qty 2

## 2023-07-18 MED ORDER — LABETALOL HCL 5 MG/ML IV SOLN
10.0000 mg | INTRAVENOUS | Status: AC | PRN
Start: 2023-07-18 — End: 2023-07-18

## 2023-07-18 MED ORDER — IOHEXOL 350 MG/ML SOLN
INTRAVENOUS | Status: DC | PRN
  Administered 2023-07-18: 55 mL

## 2023-07-18 MED ORDER — HEPARIN SODIUM (PORCINE) 1000 UNIT/ML IJ SOLN
INTRAMUSCULAR | Status: AC
  Filled 2023-07-18: qty 10

## 2023-07-18 MED ORDER — HEPARIN SODIUM (PORCINE) 1000 UNIT/ML IJ SOLN
INTRAMUSCULAR | Status: DC | PRN
  Administered 2023-07-18: 12000 [IU] via INTRAVENOUS

## 2023-07-18 MED ORDER — SODIUM CHLORIDE 0.9 % IV SOLN: INTRAVENOUS | Status: AC

## 2023-07-18 MED ORDER — HYDRALAZINE HCL 20 MG/ML IJ SOLN
10.0000 mg | INTRAMUSCULAR | Status: AC | PRN
Start: 2023-07-18 — End: 2023-07-18

## 2023-07-18 MED ORDER — LIDOCAINE HCL (PF) 1 % IJ SOLN
INTRAMUSCULAR | Status: AC
  Filled 2023-07-18: qty 30

## 2023-07-18 MED ORDER — SODIUM CHLORIDE 0.9% FLUSH: 3.0000 mL | INTRAVENOUS | Status: DC | PRN

## 2023-07-18 MED ORDER — VERAPAMIL HCL 2.5 MG/ML IV SOLN
INTRAVENOUS | Status: AC
  Filled 2023-07-18: qty 2

## 2023-07-18 MED ORDER — MIDAZOLAM HCL 2 MG/2ML IJ SOLN
INTRAMUSCULAR | Status: AC
  Filled 2023-07-18: qty 2

## 2023-07-18 MED ORDER — HEPARIN (PORCINE) IN NACL 1000-0.9 UT/500ML-% IV SOLN
INTRAVENOUS | Status: DC | PRN
  Administered 2023-07-18: 1000 mL

## 2023-07-18 MED ORDER — LIDOCAINE HCL (PF) 1 % IJ SOLN
INTRAMUSCULAR | Status: DC | PRN
  Administered 2023-07-18: 10 mL

## 2023-07-18 MED ORDER — HYDROCODONE-ACETAMINOPHEN 5-325 MG PO TABS
1.5000 | ORAL_TABLET | Freq: Once | ORAL | Status: AC
  Administered 2023-07-18: 1.5 via ORAL

## 2023-07-18 MED ORDER — FENTANYL CITRATE (PF) 100 MCG/2ML IJ SOLN
INTRAMUSCULAR | Status: AC
  Filled 2023-07-18: qty 2

## 2023-07-18 MED ORDER — MIDAZOLAM HCL 2 MG/2ML IJ SOLN
INTRAMUSCULAR | Status: DC | PRN
  Administered 2023-07-18 (×2): 1 mg via INTRAVENOUS

## 2023-07-18 MED ORDER — SODIUM CHLORIDE 0.9 % IV SOLN: INTRAVENOUS | Status: DC

## 2023-07-18 MED ORDER — SODIUM CHLORIDE 0.9% FLUSH
3.0000 mL | Freq: Two times a day (BID) | INTRAVENOUS | Status: DC
  Administered 2023-07-18 – 2023-07-20 (×4): 3 mL via INTRAVENOUS

## 2023-07-18 SURGICAL SUPPLY — 14 items
BALLN SCOREFLEX 3.0X10 (BALLOONS) ×1
BALLN ~~LOC~~ EMERGE MR 2.5X12 (BALLOONS) ×1
BALLOON SCOREFLEX 3.0X10 (BALLOONS) IMPLANT
BALLOON ~~LOC~~ EMERGE MR 2.5X12 (BALLOONS) IMPLANT
CATH VISTA GUIDE 6FR XBLD 3.5 (CATHETERS) IMPLANT
ELECT DEFIB PAD ADLT CADENCE (PAD) IMPLANT
KIT ENCORE 26 ADVANTAGE (KITS) IMPLANT
KIT MICROPUNCTURE NIT STIFF (SHEATH) IMPLANT
PACK CARDIAC CATHETERIZATION (CUSTOM PROCEDURE TRAY) ×1 IMPLANT
SET ATX-X65L (MISCELLANEOUS) IMPLANT
SHEATH PINNACLE 6F 10CM (SHEATH) IMPLANT
SHEATH PROBE COVER 6X72 (BAG) IMPLANT
WIRE ASAHI PROWATER 180CM (WIRE) IMPLANT
WIRE EMERALD 3MM-J .035X150CM (WIRE) IMPLANT

## 2023-07-18 NOTE — Progress Notes (Signed)
 Rounding Note    Patient Name: Dana [name] Date of Encounter: 07/18/2023  [facility] Cardiologist: Toribio Fuel, MD   Subjective   63 yo with hx of CAD ,  admitted with angina   For cath today ( femoral access)  Has no questions about upcoming cath     Cath from 12/31 shows severe restenosis in her prox LAD stent. She is scheduled for staged PCI today   Creatinine is 2.76 which is fairly stable for her   She has a cardiomems device.   Will see if we can get it read   Followed by Bensimhon .   Inpatient Medications    Scheduled Meds:  allopurinol   50 mg Oral Daily   aspirin  EC  81 mg Oral Daily   busPIRone   10 mg Oral TID   carvedilol   12.5 mg Oral BID   enoxaparin  (LOVENOX ) injection  40 mg Subcutaneous Q24H   ezetimibe   10 mg Oral QPM   furosemide   40 mg Intravenous BID   gabapentin   100 mg Oral BID   insulin  aspart  0-9 Units Subcutaneous Q4H   insulin  glargine-yfgn  15 Units Subcutaneous Daily   latanoprost   1 drop Both Eyes QHS   magnesium  oxide  800 mg Oral Daily   omega-3 acid ethyl esters  1 g Oral Daily   pantoprazole   40 mg Oral Daily   sodium chloride  flush  3 mL Intravenous Q12H   ticagrelor   90 mg Oral BID   Continuous Infusions:  nitroGLYCERIN  10 mcg/min (07/17/23 0519)   PRN Meds: acetaminophen , HYDROcodone -acetaminophen , hydrOXYzine , linaclotide , ondansetron  (ZOFRAN ) IV **OR** ondansetron , sodium chloride  flush   Vital Signs    Vitals:   07/18/23 0057 07/18/23 0421 07/18/23 0611 07/18/23 0751  BP: (!) 100/51 133/60  139/66  Pulse: 69   66  Resp: 20 18  20   Temp:  97.8 F (36.6 C)  97.7 F (36.5 C)  TempSrc:  Axillary  Oral  SpO2:    96%  Weight:   110 kg   Height:        Intake/Output Summary (Last 24 hours) at 07/18/2023 0943 Last data filed at 07/18/2023 0700 Gross per 24 hour  Intake 497.05 ml  Output 4400 ml  Net -3902.95 ml      07/18/2023    6:11 AM 07/17/2023    6:06 AM 07/16/2023   11:03 AM  Last 3  Weights  Weight (lbs) 242 lb 8.1 oz 244 lb 1.6 oz 234 lb 12.6 oz  Weight (kg) 110 kg 110.723 kg 106.5 kg      Telemetry    NSR   NSR - Personally Reviewed  ECG     - Personally Reviewed  Physical Exam    Physical Exam: Blood pressure 139/66, pulse 66, temperature 97.7 F (36.5 C), temperature source Oral, resp. rate 20, height 5' 5 (1.651 m), weight 110 kg, SpO2 96%.       GEN:  Well nourished, well developed in no acute distress HEENT: Normal NECK: No JVD; No carotid bruits LYMPHATICS: No lymphadenopathy CARDIAC: RRR   RESPIRATORY:  Clear to auscultation without rales, wheezing or rhonchi  ABDOMEN: Soft, non-tender, non-distended MUSCULOSKELETAL:  No edema; No deformity  SKIN: Warm and dry NEUROLOGIC:  Alert and oriented x 3   Labs    High Sensitivity Troponin:   Recent Labs  Lab 07/13/23 1325 07/13/23 1505  TROPONINIHS 21* 31*     Chemistry Recent Labs  Lab 07/12/23 1105 07/13/23  1325 07/16/23 0248 07/17/23 0307 07/18/23 0249  NA 139   < > 138 137 138  K 4.4   < > 4.7 5.4* 4.2  CL 100   < > 110 110 106  CO2 22   < > 19* 18* 22  GLUCOSE 162*   < > 73 332* 146*  BUN 70*   < > 68* 60* 65*  CREATININE 2.03*   < > 2.65* 2.41* 2.76*  CALCIUM  9.2   < > 8.4* 8.4* 8.6*  PROT 7.3  --   --   --   --   ALBUMIN  4.0  --   --   --   --   AST 21  --   --   --   --   ALT 25  --   --   --   --   ALKPHOS 226*  --   --   --   --   BILITOT 0.5  --   --   --   --   GFRNONAA  --    < > 20* 22* 19*  ANIONGAP  --    < > 9 9 10    < > = values in this interval not displayed.    Lipids  Recent Labs  Lab 07/12/23 1105  CHOL 92*  TRIG 81  HDL 47  LABVLDL 17  LDLCALC 28  CHOLHDL 2.0    Hematology Recent Labs  Lab 07/13/23 1325 07/13/23 1430 07/14/23 0512 07/15/23 1001  WBC 14.6*  --  10.0 9.9  RBC 3.66*  --  3.34* 3.31*  HGB 10.1* 11.2* 9.3* 9.2*  HCT 32.1* 33.0* 29.6* 29.5*  MCV 87.7  --  88.6 89.1  MCH 27.6  --  27.8 27.8  MCHC 31.5  --  31.4 31.2   RDW 14.4  --  14.4 14.6  PLT 250  --  239 237   Thyroid  No results for input(s): TSH, FREET4 in the last 168 hours.  BNP Recent Labs  Lab 07/13/23 1325  BNP 212.4*    DDimer No results for input(s): DDIMER in the last 168 hours.   Radiology    CARDIAC CATHETERIZATION Result Date: 07/16/2023 Severe restenosis in the proximal LAD stent. There are two layers of stent in this segment (placed in February 2024 and July 2024). The distal LAD is small in caliber. Chronic occlusion mid Circumflex beyond the stented segment. Patent obtuse marginal branch with severe diffuse distal stenosis (too small for PCI) Moderate caliber dominant RCA with moderate ostial/proximal stenosis, patent distal stent. LVEDP=15 mmHg Recommendations: She will need PCI of the proximal LAD stented segment. This PCI will be staged given her advanced kidney disease. Continue IV NTG.    Cardiac Studies     Patient Profile     63 y.o. female  wit hhx of cAD Admitted with CP    Assessment & Plan     CAD >  hx of CAD in the past .   Creatinine is not significantly better from yesterday .  Is being evaluated by nephrology   Her LV systolic function remains normal    Cath showed a tight instent restenosis of her prox LAD stent. On the schedule for PCI today   Will follow creatinine closely   2.  HLD : continue repatha  and zetia    3.  Hx of MR, s/p mitra clip , stable    For questions or updates, please contact [facility] Please consult [email or web address] for contact info  under        Signed, Aleene Passe, MD  07/18/2023, 9:44 AM

## 2023-07-18 NOTE — Progress Notes (Addendum)
 PROGRESS NOTE    Dana [name]  FMW:[id number] DOB: 06-19-1961 DOA: 07/13/2023 PCP: Sherre Clapper, MD   Brief Narrative: Dana [name] is a 63 y.o. female with a history of anxiety, chronic back pain, CKD stage IIIb, OSA on CPAP, COPD, diastolic heart failure, primary hypertension, hyperlipidemia, gout, GERD, type 1 diabetes mellitus, multivessel CAD/NSTEMI status post multiple PCI.  Patient presented secondary to chest pain consistent with angina secondary to patient's underlying CAD.  Cardiology consulted and patient started on nitroglycerin  drip with plan for cardiac catheterization with PCI.   Assessment and Plan:  Angina CAD Patient with history of multivessel disease and prior NSTEMI status post multiple PCI.  Chest pain this admission consistent with prior anginal episodes and relieved with nitroglycerin .  Cardiology consulted.  Cardiac catheterization on 12/31 was significant for proximal LAD restenosis with recommendation for PCI of the proximal LAD. -Cardiology recommendations: , nitroglycerin  drip, staged PCI secondary to poor renal function  Klebsiella pneumonia UTI Patient completed course of ceftriaxone  and transitioned to Duricef.  Diabetes mellitus type 1 Uncontrolled with hyperglycemia.  Hemoglobin A1c of 9.0%.  Patient is managed on Lantus  as an outpatient. -Continue Semglee  50 units and SSI  AKI on CKD stage IIIb Patient follows with nephrology as an outpatient.  History of CIN.  Creatinine has been as high as 2.76 baseline appears to be around 2.0. -Continue nephrology recommendations   DVT prophylaxis: Lovenox  Code Status:   Code Status: Full Code Family Communication: None at bedside.  Daughter at on telephone. Disposition Plan: Discharge pending ongoing cardiology recommendations   Consultants:  Cardiology Nephrology  Procedures:  Cardiac catheterization  Antimicrobials: Ceftriaxone  Cefadroxil    Subjective: No chest pain or  dyspnea.  Objective: BP 139/66 (BP Location: Left Arm)   Pulse 66   Temp 97.7 F (36.5 C) (Oral)   Resp 20   Ht 5' 5 (1.651 m)   Wt 110 kg   SpO2 96%   BMI 40.36 kg/m   Examination:  General exam: Appears calm and comfortable Respiratory system: Clear to auscultation. Respiratory effort normal. Cardiovascular system: S1 & S2 heard, RRR. No murmurs, rubs, gallops or clicks. Gastrointestinal system: Abdomen is nondistended, soft and nontender. Normal bowel sounds heard. Central nervous system: Alert and oriented. No focal neurological deficits. Musculoskeletal: BLE 1+ pitting edema. No calf tenderness Skin: No cyanosis. No rashes Psychiatry: Judgement and insight appear normal. Mood & affect appropriate.    Data Reviewed: I have personally reviewed following labs and imaging studies  CBC Lab Results  Component Value Date   WBC 9.9 07/15/2023   RBC 3.31 (L) 07/15/2023   HGB 9.2 (L) 07/15/2023   HCT 29.5 (L) 07/15/2023   MCV 89.1 07/15/2023   MCH 27.8 07/15/2023   PLT 237 07/15/2023   MCHC 31.2 07/15/2023   RDW 14.6 07/15/2023   LYMPHSABS 1.8 07/12/2023   MONOABS 1.0 12/06/2022   EOSABS 0.3 07/12/2023   BASOSABS 0.1 07/12/2023     Last metabolic panel Lab Results  Component Value Date   NA 138 07/18/2023   K 4.2 07/18/2023   CL 106 07/18/2023   CO2 22 07/18/2023   BUN 65 (H) 07/18/2023   CREATININE 2.76 (H) 07/18/2023   GLUCOSE 146 (H) 07/18/2023   GFRNONAA 19 (L) 07/18/2023   GFRAA 43 (L) 06/28/2020   CALCIUM  8.6 (L) 07/18/2023   PHOS 3.9 02/20/2023   PROT 7.3 07/12/2023   ALBUMIN  4.0 07/12/2023   LABGLOB 3.3 07/12/2023   AGRATIO 1.2 12/19/2022  BILITOT 0.5 07/12/2023   ALKPHOS 226 (H) 07/12/2023   AST 21 07/12/2023   ALT 25 07/12/2023   ANIONGAP 10 07/18/2023    GFR: Estimated Creatinine Clearance: 26.8 mL/min (A) (by C-G formula based on SCr of 2.76 mg/dL (H)).  Recent Results (from the past 240 hours)  Urine Culture     Status: Abnormal    Collection Time: 07/13/23  4:45 PM   Specimen: Urine, Random  Result Value Ref Range Status   Specimen Description URINE, RANDOM  Final   Special Requests   Final    NONE Reflexed from [id number] Performed at [facility], [address] [place], [place] [address]    Culture >=100,000 COLONIES/mL KLEBSIELLA PNEUMONIAE (A)  Final   Report Status 07/15/2023 FINAL  Final   Organism ID, Bacteria KLEBSIELLA PNEUMONIAE (A)  Final      Susceptibility   Klebsiella pneumoniae - MIC*    AMPICILLIN >=32 RESISTANT Resistant     CEFAZOLIN  <=4 SENSITIVE Sensitive     CEFEPIME <=0.12 SENSITIVE Sensitive     CEFTRIAXONE  <=0.25 SENSITIVE Sensitive     CIPROFLOXACIN  <=0.25 SENSITIVE Sensitive     GENTAMICIN <=1 SENSITIVE Sensitive     IMIPENEM <=0.25 SENSITIVE Sensitive     NITROFURANTOIN  64 INTERMEDIATE Intermediate     TRIMETH /SULFA  <=20 SENSITIVE Sensitive     AMPICILLIN/SULBACTAM 8 SENSITIVE Sensitive     PIP/TAZO <=4 SENSITIVE Sensitive ug/mL    * >=100,000 COLONIES/mL KLEBSIELLA PNEUMONIAE      Radiology Studies: CARDIAC CATHETERIZATION Result Date: 07/16/2023 Severe restenosis in the proximal LAD stent. There are two layers of stent in this segment (placed in February 2024 and July 2024). The distal LAD is small in caliber. Chronic occlusion mid Circumflex beyond the stented segment. Patent obtuse marginal branch with severe diffuse distal stenosis (too small for PCI) Moderate caliber dominant RCA with moderate ostial/proximal stenosis, patent distal stent. LVEDP=15 mmHg Recommendations: She will need PCI of the proximal LAD stented segment. This PCI will be staged given her advanced kidney disease. Continue IV NTG.      LOS: 5 days    Elgin Lam, MD [facility] 07/18/2023, 9:11 AM   If 7PM-7AM, please contact night-coverage [email or web address]

## 2023-07-18 NOTE — Progress Notes (Signed)
Pt placed on CPAP for night rest tolerating well

## 2023-07-18 NOTE — Progress Notes (Signed)
 ACT 181. Dr. Clifton James notified and permission received to pull arterial sheath now.

## 2023-07-18 NOTE — Progress Notes (Signed)
 Site area: rt groin Site Prior to Removal:  Level 0 Pressure Applied For: 30 minutes Manual:   yes Patient Status During Pull:  stable Post Pull Site:  Level 0 Post Pull Instructions Given:  yes Post Pull Pulses Present: rt dp palpable 2+ Dressing Applied:  gauze and tegaderm Bedrest begins @ 1900 Comments:

## 2023-07-18 NOTE — Progress Notes (Signed)
 [facility] Progress Note   Assessment/ Plan:   CKD with h/o CIN: - got contrast 12/28--> small rise in Cr possibly d/t that - pt and dtr understand risks/ benefits of more contrast - has had CIN in the past- no dialysis required - s/p cardiac cath 12/31, staged procedure today - IV Lasix  given 1/1 - continue to monitor renal function closely   2.  Chest pain/NSTEMI             - multiple PCIs   3.  DM             - per primary   4.  Dispo: pending  Subjective:    Seen in room.  Sleeping, appears more comfortable than yesterday.  Robust UOP with IV Lasix , Cr 2.76 today.  For staged procedure, hopefully today.      Objective:   BP 139/66 (BP Location: Left Arm)   Pulse 66   Temp 97.7 F (36.5 C) (Oral)   Resp 20   Ht 5' 5 (1.651 m)   Wt 110 kg   SpO2 96%   BMI 40.36 kg/m   Intake/Output Summary (Last 24 hours) at 07/18/2023 0944 Last data filed at 07/18/2023 0700 Gross per 24 hour  Intake 497.05 ml  Output 4400 ml  Net -3902.95 ml   Weight change: 3.5 kg  Physical Exam: Hzw:jeezjmd uncomfortable CVS: RRR Resp: WOB better Abd: soft Ext: 2+ LE edema, slightly impoved  Imaging: CARDIAC CATHETERIZATION Result Date: 07/16/2023 Severe restenosis in the proximal LAD stent. There are two layers of stent in this segment (placed in February 2024 and July 2024). The distal LAD is small in caliber. Chronic occlusion mid Circumflex beyond the stented segment. Patent obtuse marginal branch with severe diffuse distal stenosis (too small for PCI) Moderate caliber dominant RCA with moderate ostial/proximal stenosis, patent distal stent. LVEDP=15 mmHg Recommendations: She will need PCI of the proximal LAD stented segment. This PCI will be staged given her advanced kidney disease. Continue IV NTG.    Labs: BMET Recent Labs  Lab 07/12/23 1105 07/13/23 1325 07/13/23 1430 07/14/23 0512 07/15/23 1001 07/16/23 0248 07/17/23 0307 07/18/23 0249  NA 139 134* 138  137 138 138 137 138  K 4.4 4.3 4.2 3.9 4.5 4.7 5.4* 4.2  CL 100 101  --  106 110 110 110 106  CO2 22 22  --  22 21* 19* 18* 22  GLUCOSE 162* 328*  --  123* 72 73 332* 146*  BUN 70* 67*  --  65* 69* 68* 60* 65*  CREATININE 2.03* 1.83*  --  2.11* 2.76* 2.65* 2.41* 2.76*  CALCIUM  9.2 8.7*  --  8.5* 8.7* 8.4* 8.4* 8.6*   CBC Recent Labs  Lab 07/12/23 1105 07/13/23 1325 07/13/23 1430 07/14/23 0512 07/15/23 1001  WBC 11.0* 14.6*  --  10.0 9.9  NEUTROABS 8.0*  --   --   --   --   HGB 9.9* 10.1* 11.2* 9.3* 9.2*  HCT 31.5* 32.1* 33.0* 29.6* 29.5*  MCV 88 87.7  --  88.6 89.1  PLT 256 250  --  239 237    Medications:     allopurinol   50 mg Oral Daily   aspirin  EC  81 mg Oral Daily   busPIRone   10 mg Oral TID   carvedilol   12.5 mg Oral BID   enoxaparin  (LOVENOX ) injection  40 mg Subcutaneous Q24H   ezetimibe   10 mg Oral QPM   furosemide   40 mg Intravenous  BID   gabapentin   100 mg Oral BID   insulin  aspart  0-9 Units Subcutaneous Q4H   insulin  glargine-yfgn  15 Units Subcutaneous Daily   latanoprost   1 drop Both Eyes QHS   magnesium  oxide  800 mg Oral Daily   omega-3 acid ethyl esters  1 g Oral Daily   pantoprazole   40 mg Oral Daily   sodium chloride  flush  3 mL Intravenous Q12H   ticagrelor   90 mg Oral BID    Almarie Bonine MD 07/18/2023, 9:44 AM

## 2023-07-19 ENCOUNTER — Encounter (HOSPITAL_COMMUNITY): Payer: Self-pay | Admitting: Cardiovascular Disease

## 2023-07-19 DIAGNOSIS — I209 Angina pectoris, unspecified: Secondary | ICD-10-CM | POA: Diagnosis not present

## 2023-07-19 DIAGNOSIS — N179 Acute kidney failure, unspecified: Secondary | ICD-10-CM | POA: Diagnosis not present

## 2023-07-19 DIAGNOSIS — N184 Chronic kidney disease, stage 4 (severe): Secondary | ICD-10-CM | POA: Diagnosis not present

## 2023-07-19 LAB — BASIC METABOLIC PANEL
Anion gap: 10 (ref 5–15)
BUN: 61 mg/dL — ABNORMAL HIGH (ref 6–20)
CO2: 25 mmol/L (ref 22–32)
Calcium: 8.4 mg/dL — ABNORMAL LOW (ref 8.9–10.3)
Chloride: 104 mmol/L (ref 98–111)
Creatinine, Ser: 2.81 mg/dL — ABNORMAL HIGH (ref 0.44–1.00)
GFR, Estimated: 19 mL/min — ABNORMAL LOW (ref >60)
Glucose, Bld: 168 mg/dL — ABNORMAL HIGH (ref 70–99)
Potassium: 4.3 mmol/L (ref 3.5–5.1)
Sodium: 139 mmol/L (ref 135–145)

## 2023-07-19 LAB — HEMOGLOBIN AND HEMATOCRIT, BLOOD
HCT: 28.5 % — ABNORMAL LOW (ref 36.0–46.0)
Hemoglobin: 8.7 g/dL — ABNORMAL LOW (ref 12.0–15.0)

## 2023-07-19 LAB — GLUCOSE, CAPILLARY
Glucose-Capillary: 168 mg/dL — ABNORMAL HIGH (ref 70–99)
Glucose-Capillary: 150 mg/dL — ABNORMAL HIGH (ref 70–99)
Glucose-Capillary: 94 mg/dL (ref 70–99)
Glucose-Capillary: 125 mg/dL — ABNORMAL HIGH (ref 70–99)
Glucose-Capillary: 131 mg/dL — ABNORMAL HIGH (ref 70–99)

## 2023-07-19 LAB — IRON AND TIBC
Iron: 26 ug/dL — ABNORMAL LOW (ref 28–170)
Saturation Ratios: 12 % (ref 10.4–31.8)
TIBC: 227 ug/dL — ABNORMAL LOW (ref 250–450)
UIBC: 201 ug/dL

## 2023-07-19 LAB — TYPE AND SCREEN
ABO/RH(D): A NEG
Antibody Screen: NEGATIVE

## 2023-07-19 LAB — CBC
HCT: 25.3 % — ABNORMAL LOW (ref 36.0–46.0)
Hemoglobin: 7.9 g/dL — ABNORMAL LOW (ref 12.0–15.0)
MCH: 27.6 pg (ref 26.0–34.0)
MCHC: 31.2 g/dL (ref 30.0–36.0)
MCV: 88.5 fL (ref 80.0–100.0)
Platelets: 212 10*3/uL (ref 150–400)
RBC: 2.86 MIL/uL — ABNORMAL LOW (ref 3.87–5.11)
RDW: 14.5 % (ref 11.5–15.5)
WBC: 10.1 10*3/uL (ref 4.0–10.5)
nRBC: 0 % (ref 0.0–0.2)

## 2023-07-19 LAB — FERRITIN: Ferritin: 288 ng/mL (ref 11–307)

## 2023-07-19 MED ORDER — INSULIN ASPART 100 UNIT/ML IJ SOLN: 0.0000 [IU] | Freq: Three times a day (TID) | INTRAMUSCULAR | Status: DC

## 2023-07-19 NOTE — Plan of Care (Signed)
  Problem: Education: Goal: Understanding of CV disease, CV risk reduction, and recovery process will improve Outcome: Progressing Goal: Individualized Educational Video(s) Outcome: Not Applicable   Problem: Cardiovascular: Goal: Ability to achieve and maintain adequate cardiovascular perfusion will improve Outcome: Progressing Goal: Vascular access site(s) Level 0-1 will be maintained Outcome: Progressing   Problem: Health Behavior/Discharge Planning: Goal: Ability to safely manage health-related needs after discharge will improve Outcome: Progressing

## 2023-07-19 NOTE — Progress Notes (Signed)
 CARDIAC REHAB PHASE I    Pt resting in bed, feeling well with complaint of mild fatigue. Pt would like to try walk after breakfast. Post MI/stent education including restrictions, risk factors, exercise guidelines, antiplatelet therapy importance, MI booklet, NTG use, heart healthy diabetic diet and CRP2 reviewed. All questions and concerns addressed. Will refer to [place] for CRP2. Will continue to follow.   [id number] Dana Asberry Hacking, RN BSN 07/19/2023 9:14 AM

## 2023-07-19 NOTE — Progress Notes (Addendum)
 Patient Name: Dana [name] Date of Encounter: 07/19/2023 [facility] Cardiologist: Toribio Fuel, MD   Interval Summary  .    No chest pain or shortness of breath.  Feels that she and shortness of breath is much improved with diuretics.  Vital Signs .    Vitals:   07/18/23 2350 07/19/23 0405 07/19/23 0648 07/19/23 0810  BP: 130/69 (!) 114/57  (!) 130/53  Pulse: (!) 57 61 66   Resp: 18 20  20   Temp: 97.9 F (36.6 C) 98 F (36.7 C)  98.7 F (37.1 C)  TempSrc: Oral Oral  Oral  SpO2: 97% 96% 100%   Weight:      Height:        Intake/Output Summary (Last 24 hours) at 07/19/2023 0923 Last data filed at 07/19/2023 9361 Gross per 24 hour  Intake 1317.8 ml  Output 3350 ml  Net -2032.2 ml      07/18/2023    7:41 PM 07/18/2023    6:11 AM 07/17/2023    6:06 AM  Last 3 Weights  Weight (lbs) 236 lb 5.3 oz 242 lb 8.1 oz 244 lb 1.6 oz  Weight (kg) 107.2 kg 110 kg 110.723 kg      Telemetry/ECG    Sinus rhythm in the 60s- Personally Reviewed  CV Studies    Staged procedure 07/18/2023   Mid LAD to Dist LAD lesion is 60% stenosed.   Mid LAD lesion is 90% stenosed.   Mid Cx to Dist Cx lesion is 100% stenosed.   1st Mrg-1 lesion is 30% stenosed.   1st Mrg-2 lesion is 90% stenosed.   Non-stenotic Mid Cx lesion was previously treated.   Scoring balloon angioplasty was performed using a BALLN SCOREFLEX 3.0X10.   Post intervention, there is a 10% residual stenosis.   Severe restenosis of a focal segment of the proximal LAD stented segment.  Successful scoring balloon angioplasty of the proximal LAD stented segment.    Recommendations: She did well with her procedure. This will most likely not be a long lasting result. In July 2024 IVUS was performed and the stents are all well expanded in the proximal LAD. Scoring balloon angioplasty today alone. No new stents placed. Continue current medical therapy including ASA and Brilinta . Gentle hydration overnight and hopeful d/c home  tomorrow.   Left heart catheterization 07/16/2023 Severe restenosis in the proximal LAD stent. There are two layers of stent in this segment (placed in February 2024 and July 2024). The distal LAD is small in caliber.  Chronic occlusion mid Circumflex beyond the stented segment. Patent obtuse marginal branch with severe diffuse distal stenosis (too small for PCI) Moderate caliber dominant RCA with moderate ostial/proximal stenosis, patent distal stent.  LVEDP=15 mmHg   Recommendations: She will need PCI of the proximal LAD stented segment. This PCI will be staged given her advanced kidney disease. Continue IV NTG.   Limited echocardiogram 07/15/2023 1. Left ventricular ejection fraction, by estimation, is 55 to 60%. The  left ventricle has normal function. The left ventricle has no regional  wall motion abnormalities. There is mild left ventricular hypertrophy of  the lateral segment. Left ventricular   diastolic function could not be evaluated. There is the interventricular  septum is flattened in systole, consistent with right ventricular pressure  overload.   2. Right ventricular systolic function is normal. The right ventricular  size is normal. There is normal pulmonary artery systolic pressure.   3. Left atrial size was moderately dilated.  4. Mitral valve gradient unchanged from echo 11/2022. The mitral valve has  been repaired/replaced. Mild mitral valve regurgitation. No evidence of  mitral stenosis. The mean mitral valve gradient is 5.0 mmHg. There is a  Mitra-Clip present in the mitral  position.   5. The aortic valve is normal in structure. Aortic valve regurgitation is  not visualized. No aortic stenosis is present.   6. The inferior vena cava is dilated in size with <50% respiratory  variability, suggesting right atrial pressure of 15 mmHg.    Physical Exam .   GEN: No acute distress.   Neck: No JVD Cardiac: RRR, no murmurs, rubs, or gallops.  Respiratory: Crackles  more on the left side GI: Soft, nontender, non-distended  MS: No edema  Patient Profile    Dana [name] is a 63 y.o. female has hx of chronic HFpEF status post CardioMEMS 02/2018, CAD status post DES to RCA and LAD in 2018, insulin -dependent diabetes, hypertension, hyperlipidemia, PAD, OSA on CPAP, PAF ablation, CKD and admitted for chest pain.  Assessment & Plan .     CAD Hx of Multiple stents placed to mid LAD and RCA.  Last intervention was in 02/2023 with DES to the proximal LAD.     Cardiac catheterization this admission showed significant proximal LAD restenosis.  CTO mid circumflex, severe stenosis in distal OM branch too small for PCI.  Distal RCA stent.  She had staged procedure yesterday with successful scoring balloon angioplasty of the proximal LAD stented segment.  No stents placed, likely will not be a long-lasting result.  Continue DAPT with aspirin  and Brilinta  90 mg twice daily.  Carvedilol  12.5 mg twice daily, Zetia , Repatha  (statin intolerant).   Chronic HFpEF Significant improvement in shortness of breath with IV diuretics and good urinary output -2 L in the last 24 hours. Continue with IV Lasix  40 mg twice daily  CKD stage IV Managed per nephrology.  MR status post TEER Stable on echocardiogram this admission.  OSA on CPAP Will ensure that she has is here.  Type 1 diabetes On insulin .  No SGLT2 inhibitor.  Also has UTI.  A1c 9%  Anemia Downtrending 11.2 on the 28th now 7.9.   For questions or updates, please contact [facility] Please consult [email or web address] for contact info under        Signed, Thom LITTIE Sluder, PA-C     Attending Note:   The patient was seen and examined.  Agree with assessment and plan as noted above.  Changes made to the above note as needed.  Patient seen and independently examined with Thom Sluder, PA .   We discussed all aspects of the encounter. I agree with the assessment and plan as stated above.     CAD :  doing  well after scoring balloon PCI of instent restenosis.   Symptoms are better .   Creatinine is up slighlty   Will continue to watch    2.  CKD:  nephrology is following   3.  Anemia :  she is still fatigued.   Further plans per primary team     I have spent a total of 40 minutes with patient reviewing hospital  notes , telemetry, EKGs, labs and examining patient as well as establishing an assessment and plan that was discussed with the patient.  > 50% of time was spent in direct patient care.    Aleene DOROTHA Passe, Mickey., MD, FACC 07/19/2023, 10:13 AM [address]  [employer]  [address] Office - [phone] Pager [phone]

## 2023-07-19 NOTE — TOC Initial Note (Signed)
 Transition of Care [facility]) - Initial/Assessment Note    Patient Details  Name: Dana [name] MRN: [id number] Date of Birth: Aug 12, 1960  Transition of Care [facility] - ) CM/SW Contact:    Sudie Erminio Deems, RN Phone Number: 07/19/2023, 3:48 PM  Clinical Narrative: Risk for readmission assessment completed. Patient is a transfer from 3 E. Patient presented for chest pain-post LH. PTA patient was from home with spouse and her mother. Patient reports she has DME cane, rolling walker, and shower chair. No home needs identified during the visit. Case Manager will continue to follow for additional transition of care needs as the patient progresses.                  Expected Discharge Plan: Home/Self Care Barriers to Discharge: Continued Medical Work up   Patient Goals and CMS Choice Patient states their goals for this hospitalization and ongoing recovery are:: plan to return home once stable.  Expected Discharge Plan and Services   Discharge Planning Services: CM Consult Post Acute Care Choice: NA Living arrangements for the past 2 months: Single Family Home  Prior Living Arrangements/Services Living arrangements for the past 2 months: Single Family Home Lives with:: Spouse Patient language and need for interpreter reviewed:: Yes Do you feel safe going back to the place where you live?: Yes      Need for Family Participation in Patient Care: Yes (Comment) Care giver support system in place?: Yes (comment)   Criminal Activity/Legal Involvement Pertinent to Current Situation/Hospitalization: No - Comment as needed  Activities of Daily Living   ADL Screening (condition at time of admission) Independently performs ADLs?: Yes (appropriate for developmental age) Is the patient deaf or have difficulty hearing?: No Does the patient have difficulty seeing, even when wearing glasses/contacts?: Yes Does the patient have difficulty concentrating, remembering, or making decisions?:  No  Permission Sought/Granted Permission sought to share information with : Family Supports, Case Manager Permission granted to share information with : Yes, Verbal Permission Granted              Emotional Assessment Appearance:: Appears stated age Attitude/Demeanor/Rapport: Engaged Affect (typically observed): Appropriate Orientation: : Oriented to Self, Oriented to Place, Oriented to  Time, Oriented to Situation Alcohol / Substance Use: Not Applicable Psych Involvement: No (comment)  Admission diagnosis:  Hyperglycemia [R73.9] Angina pectoris (HCC) [I20.9] Chest pain, unspecified type [R07.9] Patient Active Problem List   Diagnosis Date Noted   Angina pectoris (HCC) 07/13/2023   Hyperglycemia 07/13/2023   Presence of heart assist device (HCC) 07/13/2023   Moderate recurrent major depression (HCC) 07/13/2023   Acute pain of right knee 07/13/2023   History of amputation of hallux (HCC) 05/24/2023   Bilateral hand pain 05/14/2023   Encounter for immunization 04/04/2023   Acute upper respiratory infection 03/03/2023   Contrast-induced nephropathy 02/20/2023   Chest pain 12/07/2022   Dark urine 09/22/2022   Unstable angina (HCC) 09/07/2022   Chronic idiopathic constipation 09/01/2022   Hyperphosphatemia 01/09/2022   Genetic testing 11/07/2021   Family history of breast cancer 10/23/2021   GAD (generalized anxiety disorder) 07/11/2021   Mixed hyperlipidemia 07/11/2021   S/P mitral valve clip implantation 05/18/2021   History of cardiac arrest 02/06/2021   Impaired mobility and ADLs 01/20/2021   Left-sided weakness 01/20/2021   Drug-induced myopathy 01/03/2021   Secondary hyperparathyroidism of renal origin (HCC) 11/18/2020   Vitamin D  insufficiency 11/18/2020   Chronic kidney disease, stage 4 (severe) (HCC) 10/13/2020   NSTEMI (non-ST elevated myocardial infarction) (HCC) 09/03/2020  Contrast dye induced nephropathy, possible 09/03/2020   OSA on CPAP 09/03/2020    Insulin  dependent type 2 diabetes mellitus (HCC) 09/03/2020   Need for COVID-19 vaccine 06/28/2020   Chronic respiratory failure with hypoxia (HCC) 03/23/2020   Chronic gout without tophus 03/23/2020   Pain in both hands 03/23/2020   Uncomplicated opioid dependence (HCC) 03/23/2020   Depression, major, recurrent, mild (HCC) 12/21/2019   Hypomagnesemia 12/21/2019   Lumbar back pain 10/05/2019   Hypertensive heart and renal disease with congestive heart failure (HCC) 09/18/2019   Demand ischemia (HCC)    Chronic heart failure with preserved ejection fraction (HFpEF) (HCC) 11/07/2017   Class 2 severe obesity due to excess calories with serious comorbidity and body mass index (BMI) of 39.0 to 39.9 in adult (HCC) 11/07/2017   Hyperlipidemia LDL goal <70 12/14/2016   Anemia in CKD (chronic kidney disease) 03/23/2016   Chronic pain syndrome 03/23/2016   Coronary artery disease of native artery of native heart with stable angina pectoris (HCC) 03/23/2016   GERD without esophagitis 03/23/2016   On home oxygen  therapy 03/23/2016   S/P ablation of atrial fibrillation 10/04/2015   Acute renal failure superimposed on stage 3b chronic kidney disease (HCC) 08/30/2015   Diabetic polyneuropathy associated with type 2 diabetes mellitus (HCC) 04/02/2014   PCP:  Sherre Clapper, MD Pharmacy:   [facility] [address] [place] [address] Phone: [phone] Fax: [phone]     Social Drivers of Health (SDOH) Social History: SDOH Screenings   Food Insecurity: No Food Insecurity (07/14/2023)  Housing: Low Risk  (07/14/2023)  Transportation Needs: No Transportation Needs (07/14/2023)  Utilities: Patient Declined (07/14/2023)  Alcohol Screen: Low Risk  (07/25/2022)  Depression (PHQ2-9): High Risk (07/12/2023)  Financial Resource Strain: Low Risk  (07/01/2023)  Physical Activity: Insufficiently Active (07/01/2023)  Social Connections: Moderately Integrated  (07/25/2022)  Stress: Stress Concern Present (07/01/2023)  Tobacco Use: Medium Risk (07/13/2023)  Health Literacy: Adequate Health Literacy (01/30/2023)   SDOH Interventions:     Readmission Risk Interventions    07/19/2023    3:45 PM 12/16/2022   11:54 AM 05/19/2021    1:11 PM  Readmission Risk Prevention Plan  Transportation Screening Complete Complete Complete  Medication Review (RN Care Manager) Referral to Pharmacy Referral to Pharmacy Complete  PCP or Specialist appointment within 3-5 days of discharge  Complete Complete  HRI or Home Care Consult Complete Complete Complete  SW Recovery Care/Counseling Consult Complete Complete Complete  Palliative Care Screening Not Applicable Not Applicable Not Applicable  Skilled Nursing Facility Not Applicable Not Applicable Not Applicable

## 2023-07-19 NOTE — Progress Notes (Signed)
 PROGRESS NOTE    Dana [name]  FMW:[id number] DOB: 12/18/1960 DOA: 07/13/2023 PCP: Sherre Clapper, MD   Brief Narrative: Dana [name] is a 63 y.o. female with a history of anxiety, chronic back pain, CKD stage IIIb, OSA on CPAP, COPD, diastolic heart failure, primary hypertension, hyperlipidemia, gout, GERD, type 1 diabetes mellitus, multivessel CAD/NSTEMI status post multiple PCI.  Patient presented secondary to chest pain consistent with angina secondary to patient's underlying CAD.  Cardiology consulted and patient started on nitroglycerin  drip. Heart catheterization with balloon angioplasty of LAD performed on 1/2.   Assessment and Plan:  Angina CAD Patient with history of multivessel disease and prior NSTEMI status post multiple PCI.  Chest pain this admission consistent with prior anginal episodes and relieved with nitroglycerin .  Cardiology consulted.  Cardiac catheterization on 12/31 was significant for proximal LAD restenosis with recommendation for PCI of the proximal LAD. Successful balloon angioplasty performed on 1/2. -Cardiology recommendations: , nitroglycerin  drip, staged PCI secondary to poor renal function -Continue aspirin  and Brilinta   Klebsiella pneumonia UTI Patient completed course of ceftriaxone  and transitioned to Duricef.  Diabetes mellitus type 1 Uncontrolled with hyperglycemia.  Hemoglobin A1c of 9.0%.  Patient is managed on Lantus  as an outpatient. -Continue Semglee  50 units and SSI  AKI on CKD stage IV Patient follows with nephrology as an outpatient.  History of CIN.  Creatinine has been as high as 2.76 baseline appears to be around 2.0. -Continue nephrology recommendations  Acute on chronic anemia Unclear etiology for acute drop. Patient with baseline hemoglobin of around 8-10. Hemoglobin down from 9.2 g/dL to 7.9 g/dL overnight. Patient is on aspirin  and Brilinta . No source of hemorrhage identified. -Check H&H, and if still below 8 g/dL, will  transfuse 1 unit of PRBC -Check FOBT   DVT prophylaxis: Lovenox  Code Status:   Code Status: Full Code Family Communication: None at bedside.  Disposition Plan: Discharge pending ongoing cardiology recommendations and stable hemoglobin   Consultants:  Cardiology Nephrology  Procedures:  Cardiac catheterization  Antimicrobials: Ceftriaxone  Cefadroxil    Subjective: No chest pain or dyspnea. No concerns from overnight. States she has had hemoglobin drops after cardiac intervention in the past.  Objective: BP (!) 130/53 (BP Location: Right Arm)   Pulse 66   Temp 98.7 F (37.1 C) (Oral)   Resp 20   Ht 5' 4.5 (1.638 m)   Wt 107.2 kg   SpO2 100%   BMI 39.94 kg/m   Examination:  General exam: Appears calm and comfortable Respiratory system: Clear to auscultation. Respiratory effort normal. Cardiovascular system: S1 & S2 heard, RRR. Gastrointestinal system: Abdomen is nondistended, soft and nontender. Normal bowel sounds heard. Central nervous system: Alert and oriented. No focal neurological deficits. Musculoskeletal: No calf tenderness Psychiatry: Judgement and insight appear normal. Mood & affect appropriate.    Data Reviewed: I have personally reviewed following labs and imaging studies  CBC Lab Results  Component Value Date   WBC 10.1 07/19/2023   RBC 2.86 (L) 07/19/2023   HGB 7.9 (L) 07/19/2023   HCT 25.3 (L) 07/19/2023   MCV 88.5 07/19/2023   MCH 27.6 07/19/2023   PLT 212 07/19/2023   MCHC 31.2 07/19/2023   RDW 14.5 07/19/2023   LYMPHSABS 1.8 07/12/2023   MONOABS 1.0 12/06/2022   EOSABS 0.3 07/12/2023   BASOSABS 0.1 07/12/2023     Last metabolic panel Lab Results  Component Value Date   NA 139 07/19/2023   K 4.3 07/19/2023   CL 104 07/19/2023  CO2 25 07/19/2023   BUN 61 (H) 07/19/2023   CREATININE 2.81 (H) 07/19/2023   GLUCOSE 168 (H) 07/19/2023   GFRNONAA 19 (L) 07/19/2023   GFRAA 43 (L) 06/28/2020   CALCIUM  8.4 (L) 07/19/2023   PHOS  3.9 02/20/2023   PROT 7.3 07/12/2023   ALBUMIN  4.0 07/12/2023   LABGLOB 3.3 07/12/2023   AGRATIO 1.2 12/19/2022   BILITOT 0.5 07/12/2023   ALKPHOS 226 (H) 07/12/2023   AST 21 07/12/2023   ALT 25 07/12/2023   ANIONGAP 10 07/19/2023    GFR: Estimated Creatinine Clearance: 25.7 mL/min (A) (by C-G formula based on SCr of 2.81 mg/dL (H)).  Recent Results (from the past 240 hours)  Urine Culture     Status: Abnormal   Collection Time: 07/13/23  4:45 PM   Specimen: Urine, Random  Result Value Ref Range Status   Specimen Description URINE, RANDOM  Final   Special Requests   Final    NONE Reflexed from [id number] Performed at [facility], [address] [place], [place] [address]    Culture >=100,000 COLONIES/mL KLEBSIELLA PNEUMONIAE (A)  Final   Report Status 07/15/2023 FINAL  Final   Organism ID, Bacteria KLEBSIELLA PNEUMONIAE (A)  Final      Susceptibility   Klebsiella pneumoniae - MIC*    AMPICILLIN >=32 RESISTANT Resistant     CEFAZOLIN  <=4 SENSITIVE Sensitive     CEFEPIME <=0.12 SENSITIVE Sensitive     CEFTRIAXONE  <=0.25 SENSITIVE Sensitive     CIPROFLOXACIN  <=0.25 SENSITIVE Sensitive     GENTAMICIN <=1 SENSITIVE Sensitive     IMIPENEM <=0.25 SENSITIVE Sensitive     NITROFURANTOIN  64 INTERMEDIATE Intermediate     TRIMETH /SULFA  <=20 SENSITIVE Sensitive     AMPICILLIN/SULBACTAM 8 SENSITIVE Sensitive     PIP/TAZO <=4 SENSITIVE Sensitive ug/mL    * >=100,000 COLONIES/mL KLEBSIELLA PNEUMONIAE      Radiology Studies: CARDIAC CATHETERIZATION Result Date: 07/18/2023   Mid LAD to Dist LAD lesion is 60% stenosed.   Mid LAD lesion is 90% stenosed.   Mid Cx to Dist Cx lesion is 100% stenosed.   1st Mrg-1 lesion is 30% stenosed.   1st Mrg-2 lesion is 90% stenosed.   Non-stenotic Mid Cx lesion was previously treated.   Scoring balloon angioplasty was performed using a BALLN SCOREFLEX 3.0X10.   Post intervention, there is a 10% residual stenosis. Severe restenosis of a focal segment  of the proximal LAD stented segment. Successful scoring balloon angioplasty of the proximal LAD stented segment. Recommendations: She did well with her procedure. This will most likely not be a long lasting result. In July 2024 IVUS was performed and the stents are all well expanded in the proximal LAD. Scoring balloon angioplasty today alone. No new stents placed. Continue current medical therapy including ASA and Brilinta . Gentle hydration overnight and hopeful d/c home tomorrow.      LOS: 6 days    Elgin Lam, MD [facility] 07/19/2023, 10:42 AM   If 7PM-7AM, please contact night-coverage [email or web address]

## 2023-07-19 NOTE — Plan of Care (Signed)
  Problem: Education: Goal: Ability to describe self-care measures that may prevent or decrease complications (Diabetes Survival Skills Education) will improve Outcome: Progressing Goal: Individualized Educational Video(s) Outcome: Progressing   Problem: Coping: Goal: Ability to adjust to condition or change in health will improve Outcome: Progressing   Problem: Fluid Volume: Goal: Ability to maintain a balanced intake and output will improve Outcome: Progressing   Problem: Health Behavior/Discharge Planning: Goal: Ability to identify and utilize available resources and services will improve Outcome: Progressing Goal: Ability to manage health-related needs will improve Outcome: Progressing   Problem: Metabolic: Goal: Ability to maintain appropriate glucose levels will improve Outcome: Progressing   Problem: Nutritional: Goal: Maintenance of adequate nutrition will improve Outcome: Progressing Goal: Progress toward achieving an optimal weight will improve Outcome: Progressing   Problem: Skin Integrity: Goal: Risk for impaired skin integrity will decrease Outcome: Progressing   Problem: Tissue Perfusion: Goal: Adequacy of tissue perfusion will improve Outcome: Progressing   Problem: Education: Goal: Understanding of CV disease, CV risk reduction, and recovery process will improve Outcome: Progressing   Problem: Activity: Goal: Ability to return to baseline activity level will improve Outcome: Progressing   Problem: Cardiovascular: Goal: Ability to achieve and maintain adequate cardiovascular perfusion will improve Outcome: Progressing Goal: Vascular access site(s) Level 0-1 will be maintained Outcome: Progressing   Problem: Health Behavior/Discharge Planning: Goal: Ability to safely manage health-related needs after discharge will improve Outcome: Progressing   Problem: Education: Goal: Knowledge of General Education information will improve Description:  Including pain rating scale, medication(s)/side effects and non-pharmacologic comfort measures Outcome: Progressing   Problem: Health Behavior/Discharge Planning: Goal: Ability to manage health-related needs will improve Outcome: Progressing   Problem: Clinical Measurements: Goal: Ability to maintain clinical measurements within normal limits will improve Outcome: Progressing Goal: Will remain free from infection Outcome: Progressing Goal: Diagnostic test results will improve Outcome: Progressing Goal: Respiratory complications will improve Outcome: Progressing Goal: Cardiovascular complication will be avoided Outcome: Progressing   Problem: Activity: Goal: Risk for activity intolerance will decrease Outcome: Progressing   Problem: Nutrition: Goal: Adequate nutrition will be maintained Outcome: Progressing   Problem: Coping: Goal: Level of anxiety will decrease Outcome: Progressing   Problem: Elimination: Goal: Will not experience complications related to bowel motility Outcome: Progressing Goal: Will not experience complications related to urinary retention Outcome: Progressing   Problem: Pain Management: Goal: General experience of comfort will improve Outcome: Progressing   Problem: Safety: Goal: Ability to remain free from injury will improve Outcome: Progressing   Problem: Skin Integrity: Goal: Risk for impaired skin integrity will decrease Outcome: Progressing

## 2023-07-19 NOTE — Progress Notes (Signed)
 [facility] Progress Note   Assessment/ Plan:   CKD with h/o CIN: - got contrast 12/28--> small rise in Cr possibly d/t that - pt and dtr understand risks/ benefits of more contrast - has had CIN in the past- no dialysis required - s/p cardiac cath 12/31, 07/17/22 - IV Lasix   - continue to monitor renal function closely - may be able to go home tomorrow if renal function is relatively stable- she has had sig CIN with previous caths so would be better to watch another day   2.  Chest pain/NSTEMI             - multiple PCIs  - staged procedure 12/31 and 07/17/22   3.  DM             - per primary  4.  Anemia:  - added iron  panel on   4.  Dispo: pending  Subjective:    S/p staged cath yesterday.  Still has IVFs running- stopped.        Objective:   BP (!) 123/57 (BP Location: Right Arm)   Pulse 66   Temp 97.9 F (36.6 C) (Oral)   Resp 20   Ht 5' 4.5 (1.638 m)   Wt 107.2 kg   SpO2 100%   BMI 39.94 kg/m   Intake/Output Summary (Last 24 hours) at 07/19/2023 1249 Last data filed at 07/19/2023 9361 Gross per 24 hour  Intake 1317.8 ml  Output 2700 ml  Net -1382.2 ml   Weight change: -2.8 kg  Physical Exam: Hzw:jeezjmd uncomfortable CVS: RRR Resp: WOB better Abd: soft Ext: 2+ LE edema, slightly impoved  Imaging: CARDIAC CATHETERIZATION Result Date: 07/18/2023   Mid LAD to Dist LAD lesion is 60% stenosed.   Mid LAD lesion is 90% stenosed.   Mid Cx to Dist Cx lesion is 100% stenosed.   1st Mrg-1 lesion is 30% stenosed.   1st Mrg-2 lesion is 90% stenosed.   Non-stenotic Mid Cx lesion was previously treated.   Scoring balloon angioplasty was performed using a BALLN SCOREFLEX 3.0X10.   Post intervention, there is a 10% residual stenosis. Severe restenosis of a focal segment of the proximal LAD stented segment. Successful scoring balloon angioplasty of the proximal LAD stented segment. Recommendations: She did well with her procedure. This will most likely not be a  long lasting result. In July 2024 IVUS was performed and the stents are all well expanded in the proximal LAD. Scoring balloon angioplasty today alone. No new stents placed. Continue current medical therapy including ASA and Brilinta . Gentle hydration overnight and hopeful d/c home tomorrow.    Labs: BMET Recent Labs  Lab 07/13/23 1325 07/13/23 1430 07/14/23 0512 07/15/23 1001 07/16/23 0248 07/17/23 0307 07/18/23 0249 07/19/23 0524  NA 134* 138 137 138 138 137 138 139  K 4.3 4.2 3.9 4.5 4.7 5.4* 4.2 4.3  CL 101  --  106 110 110 110 106 104  CO2 22  --  22 21* 19* 18* 22 25  GLUCOSE 328*  --  123* 72 73 332* 146* 168*  BUN 67*  --  65* 69* 68* 60* 65* 61*  CREATININE 1.83*  --  2.11* 2.76* 2.65* 2.41* 2.76* 2.81*  CALCIUM  8.7*  --  8.5* 8.7* 8.4* 8.4* 8.6* 8.4*   CBC Recent Labs  Lab 07/13/23 1325 07/13/23 1430 07/14/23 0512 07/15/23 1001 07/19/23 0524  WBC 14.6*  --  10.0 9.9 10.1  HGB 10.1* 11.2* 9.3* 9.2* 7.9*  HCT  32.1* 33.0* 29.6* 29.5* 25.3*  MCV 87.7  --  88.6 89.1 88.5  PLT 250  --  239 237 212    Medications:     allopurinol   50 mg Oral Daily   aspirin  EC  81 mg Oral Daily   busPIRone   10 mg Oral TID   carvedilol   12.5 mg Oral BID   enoxaparin  (LOVENOX ) injection  40 mg Subcutaneous Q24H   ezetimibe   10 mg Oral QPM   furosemide   40 mg Intravenous BID   gabapentin   100 mg Oral BID   insulin  aspart  0-9 Units Subcutaneous Q4H   insulin  glargine-yfgn  15 Units Subcutaneous Daily   latanoprost   1 drop Both Eyes QHS   magnesium  oxide  800 mg Oral Daily   omega-3 acid ethyl esters  1 g Oral Daily   pantoprazole   40 mg Oral Daily   sodium chloride  flush  3 mL Intravenous Q12H   sodium chloride  flush  3 mL Intravenous Q12H   ticagrelor   90 mg Oral BID    Almarie Bonine MD 07/19/2023, 12:49 PM

## 2023-07-20 DIAGNOSIS — I209 Angina pectoris, unspecified: Secondary | ICD-10-CM | POA: Diagnosis not present

## 2023-07-20 DIAGNOSIS — J9601 Acute respiratory failure with hypoxia: Secondary | ICD-10-CM

## 2023-07-20 DIAGNOSIS — I25119 Atherosclerotic heart disease of native coronary artery with unspecified angina pectoris: Secondary | ICD-10-CM

## 2023-07-20 DIAGNOSIS — I5033 Acute on chronic diastolic (congestive) heart failure: Secondary | ICD-10-CM

## 2023-07-20 HISTORY — DX: Acute respiratory failure with hypoxia: J96.01

## 2023-07-20 LAB — CBC
HCT: 26.5 % — ABNORMAL LOW (ref 36.0–46.0)
Hemoglobin: 8.5 g/dL — ABNORMAL LOW (ref 12.0–15.0)
MCH: 28.1 pg (ref 26.0–34.0)
MCHC: 32.1 g/dL (ref 30.0–36.0)
MCV: 87.7 fL (ref 80.0–100.0)
Platelets: 223 10*3/uL (ref 150–400)
RBC: 3.02 MIL/uL — ABNORMAL LOW (ref 3.87–5.11)
RDW: 14.5 % (ref 11.5–15.5)
WBC: 9 10*3/uL (ref 4.0–10.5)
nRBC: 0 % (ref 0.0–0.2)

## 2023-07-20 LAB — GLUCOSE, CAPILLARY
Glucose-Capillary: 97 mg/dL (ref 70–99)
Glucose-Capillary: 106 mg/dL — ABNORMAL HIGH (ref 70–99)

## 2023-07-20 LAB — BASIC METABOLIC PANEL
Anion gap: 9 (ref 5–15)
BUN: 64 mg/dL — ABNORMAL HIGH (ref 6–20)
CO2: 25 mmol/L (ref 22–32)
Calcium: 8.5 mg/dL — ABNORMAL LOW (ref 8.9–10.3)
Chloride: 105 mmol/L (ref 98–111)
Creatinine, Ser: 2.61 mg/dL — ABNORMAL HIGH (ref 0.44–1.00)
GFR, Estimated: 20 mL/min — ABNORMAL LOW (ref >60)
Glucose, Bld: 103 mg/dL — ABNORMAL HIGH (ref 70–99)
Potassium: 4.2 mmol/L (ref 3.5–5.1)
Sodium: 139 mmol/L (ref 135–145)

## 2023-07-20 MED ORDER — LANTUS SOLOSTAR 100 UNIT/ML ~~LOC~~ SOPN: 40.0000 [IU] | PEN_INJECTOR | Freq: Every day | SUBCUTANEOUS | Status: DC

## 2023-07-20 MED ORDER — TORSEMIDE 20 MG PO TABS: ORAL_TABLET | ORAL | 0 refills | Status: DC

## 2023-07-20 MED ORDER — TORSEMIDE 20 MG PO TABS
20.0000 mg | ORAL_TABLET | Freq: Once | ORAL | Status: AC
  Administered 2023-07-20: 20 mg via ORAL
  Filled 2023-07-20: qty 1

## 2023-07-20 MED ORDER — TORSEMIDE 20 MG PO TABS: 60.0000 mg | ORAL_TABLET | Freq: Two times a day (BID) | ORAL | Status: DC

## 2023-07-20 NOTE — Progress Notes (Signed)
 [facility] Progress Note   Assessment/ Plan:   CKD with h/o CIN: - got contrast 12/28--> small rise in Cr possibly d/t that - pt and dtr understand risks/ benefits of more contrast - has had CIN in the past- no dialysis required - s/p cardiac cath 12/31, 07/17/22 - Cr stable - good response to IV Lasix - convert to PO torsemide -- 60 mg daily is home dose- would do 60 mg BID x 3 days and then back to home dose - I expect continued robust diuresis- would be OK for her to go from renal standpoint if sats OK with walking   2.  Chest pain/NSTEMI             - multiple PCIs  - staged procedure 12/31 and 07/17/22   3.  DM             - per primary  4.  Anemia:  - added iron  panel on- rec PO iron - allergic to IV   4.  Dispo: pending  Subjective:    Off O2 and on RA this AM, swelling coming down.  Cr [place].  About to walk with cardiac rehab     Objective:   BP (!) 119/47 (BP Location: Right Wrist)   Pulse 61   Temp 97.8 F (36.6 C) (Oral)   Resp 20   Ht 5' 4.5 (1.638 m)   Wt 107.2 kg   SpO2 94%   BMI 39.94 kg/m   Intake/Output Summary (Last 24 hours) at 07/20/2023 1156 Last data filed at 07/20/2023 0458 Gross per 24 hour  Intake 120 ml  Output 3200 ml  Net -3080 ml   Weight change:   Physical Exam: Hzw:jeezjmd uncomfortable CVS: RRR Resp: WOB better Abd: soft Ext: 2+ LE edema, slightly impoved  Imaging: CARDIAC CATHETERIZATION Result Date: 07/18/2023   Mid LAD to Dist LAD lesion is 60% stenosed.   Mid LAD lesion is 90% stenosed.   Mid Cx to Dist Cx lesion is 100% stenosed.   1st Mrg-1 lesion is 30% stenosed.   1st Mrg-2 lesion is 90% stenosed.   Non-stenotic Mid Cx lesion was previously treated.   Scoring balloon angioplasty was performed using a BALLN SCOREFLEX 3.0X10.   Post intervention, there is a 10% residual stenosis. Severe restenosis of a focal segment of the proximal LAD stented segment. Successful scoring balloon angioplasty of the proximal LAD  stented segment. Recommendations: She did well with her procedure. This will most likely not be a long lasting result. In July 2024 IVUS was performed and the stents are all well expanded in the proximal LAD. Scoring balloon angioplasty today alone. No new stents placed. Continue current medical therapy including ASA and Brilinta . Gentle hydration overnight and hopeful d/c home tomorrow.    Labs: BMET Recent Labs  Lab 07/14/23 0512 07/15/23 1001 07/16/23 0248 07/17/23 0307 07/18/23 0249 07/19/23 0524 07/20/23 0348  NA 137 138 138 137 138 139 139  K 3.9 4.5 4.7 5.4* 4.2 4.3 4.2  CL 106 110 110 110 106 104 105  CO2 22 21* 19* 18* 22 25 25   GLUCOSE 123* 72 73 332* 146* 168* 103*  BUN 65* 69* 68* 60* 65* 61* 64*  CREATININE 2.11* 2.76* 2.65* 2.41* 2.76* 2.81* 2.61*  CALCIUM  8.5* 8.7* 8.4* 8.4* 8.6* 8.4* 8.5*   CBC Recent Labs  Lab 07/14/23 0512 07/15/23 1001 07/19/23 0524 07/19/23 1634 07/20/23 0821  WBC 10.0 9.9 10.1  --  9.0  HGB 9.3* 9.2* 7.9* 8.7*  8.5*  HCT 29.6* 29.5* 25.3* 28.5* 26.5*  MCV 88.6 89.1 88.5  --  87.7  PLT 239 237 212  --  223    Medications:     allopurinol   50 mg Oral Daily   aspirin  EC  81 mg Oral Daily   busPIRone   10 mg Oral TID   carvedilol   12.5 mg Oral BID   enoxaparin  (LOVENOX ) injection  40 mg Subcutaneous Q24H   ezetimibe   10 mg Oral QPM   gabapentin   100 mg Oral BID   insulin  aspart  0-9 Units Subcutaneous TID WC   insulin  glargine-yfgn  15 Units Subcutaneous Daily   latanoprost   1 drop Both Eyes QHS   magnesium  oxide  800 mg Oral Daily   omega-3 acid ethyl esters  1 g Oral Daily   pantoprazole   40 mg Oral Daily   sodium chloride  flush  3 mL Intravenous Q12H   sodium chloride  flush  3 mL Intravenous Q12H   ticagrelor   90 mg Oral BID   torsemide   20 mg Oral Once    Almarie Bonine MD 07/20/2023, 11:56 AM

## 2023-07-20 NOTE — Progress Notes (Signed)
 CARDIAC REHAB PHASE I   PRE:  Rate/Rhythm: 68 SR  BP:  Sitting: 119/47      SaO2: 98 RA   MODE:  Ambulation: 75 ft   POST:  Rate/Rhythm: 59 SB  BP:  Sitting: 136/61      SaO2: 97 RA  Pt ambulated in hallway. Tolerated well with no CP, SOB or dizziness. Returned bed with call bell and bedside table in reach. Will continue to follow.   [id number]  Vaughn Asberry Hacking, RN BSN 07/20/2023 11:03 AM

## 2023-07-20 NOTE — Discharge Instructions (Signed)
 Dana [name],  You were in the hospital with chest pain caused by narrowing of your heart blood vessel. This was managed with a balloon procedure to open up the artery. You were also treated for heart failure, renal impairment and a UTI, with improvement. You will need to increase your torsemide  for the next few days and then return to your previous dose. Please follow-up with your PCP and specialists.

## 2023-07-20 NOTE — Discharge Summary (Signed)
 Physician Discharge Summary   Patient: Dana [name] MRN: [id number] DOB: [birth year]  Admit date:     07/13/2023  Discharge date: 07/20/23  Discharge Physician: Elgin Lam, MD   PCP: Sherre Clapper, MD   Recommendations at discharge:  PCP visit for hospital follow-up Cardiology and nephrology follow-up  Discharge Diagnoses: Principal Problem:   Angina pectoris [facility]) Active Problems:   Hyperglycemia   Coronary artery disease involving native coronary artery of native heart with angina pectoris (HCC)   Acute on chronic heart failure with preserved ejection fraction (HCC)   Acute respiratory failure with hypoxia (HCC)  Resolved Problems:   * No resolved hospital problems. *  Hospital Course: Dana [name] is a 63 y.o. female with a history of anxiety, chronic back pain, CKD stage IIIb, OSA on CPAP, COPD, diastolic heart failure, primary hypertension, hyperlipidemia, gout, GERD, type 1 diabetes mellitus, multivessel CAD/NSTEMI status post multiple PCI.  Patient presented secondary to chest pain consistent with angina secondary to patient's underlying CAD.  Cardiology consulted and patient started on nitroglycerin  drip. Heart catheterization with balloon angioplasty of LAD performed on 1/2. Patient weaned off oxygen  prior to discharge.  Assessment and Plan:  Angina CAD Patient with history of multivessel disease and prior NSTEMI status post multiple PCI.  Chest pain this admission consistent with prior anginal episodes and relieved with nitroglycerin .  Cardiology consulted.  Cardiac catheterization on 12/31 was significant for proximal LAD restenosis with recommendation for PCI of the proximal LAD. Successful balloon angioplasty performed on 1/2. Recommendation to continue Aspirin  and Brilinta . Follow-up with Cardiology.   Klebsiella pneumonia UTI Patient completed course of ceftriaxone  and transitioned to Duricef.   Diabetes mellitus Unclear if type 1 or 2 from chart review, but  it appears she is insulin  dependent. Uncontrolled with hyperglycemia.  Hemoglobin A1c of 9.0%.  Patient is managed on Lantus  as an outpatient. Continue insulin  regimen on discharge.   AKI on CKD stage IV Patient follows with nephrology as an outpatient.  History of CIN.  Creatinine has been as high as 2.76 baseline appears to be around 2.0. patient managed with help of nephrology and Lasix  IV. Recommendation to resume home torsemide  60 mg at BID dosing for 3 days, followed by resumption of prior dosing of torsemide  60 mg daily. BUN/Creatinine of 64/2.61 respectively on discharge. Follow-up with nephrology outpatient.   Acute on chronic anemia Unclear etiology for acute drop. Patient with baseline hemoglobin of around 8-10. Hemoglobin down from 9.2 g/dL to 7.9 g/dL overnight. Patient is on aspirin  and Brilinta . No source of hemorrhage identified. Repeat hemoglobin stable at 8.5 g/dL.  Acute respiratory failure with hypoxia Hypoxia with associated dyspnea. Patient weaned to room air prior to discharge.  Acute on chronic HFpEF Patient managed with Lasix  IV. Hypoxia improved. Discharge on home torsemide .  Consultants:  Cardiology Nephrology   Procedures:  Cardiac catheterization  Disposition: Home Diet recommendation: Cardiac and Carb modified diet   DISCHARGE MEDICATION: Allergies as of 07/20/2023       Reactions   Naproxen Hives, Rash   Shellfish-derived Products Hives, Swelling, Other (See Comments)   Angioedema   Strawberry (diagnostic) Hives   Celecoxib Other (See Comments)   Stomach pain   Glucosamine Hives, Swelling, Other (See Comments)   Angioedema   Iron  Hives, Other (See Comments)   IV iron  transfusion - hives - Feb 2022 hospitalization Patient said she CAN tolerate if given Benadryl  first   Metoclopramide Other (See Comments)   Facial twitching and stuttering  Metolazone  Other (See Comments)   Acute renal failure   Statins Other (See Comments)   Muscle pain         Medication List     TAKE these medications    allopurinol  100 MG tablet Commonly known as: ZYLOPRIM  Take 0.5 tablets (50 mg total) by mouth daily.   aspirin  EC 81 MG tablet Take 1 tablet (81 mg total) by mouth daily. Swallow whole.   BD Pen Needle Nano 2nd Gen 32G X 4 MM Misc Generic drug: Insulin  Pen Needle Inject 1 each into the skin in the morning, at noon, in the evening, and at bedtime.   busPIRone  10 MG tablet Commonly known as: BUSPAR  Take 10 mg by mouth 3 (three) times daily.   carvedilol  12.5 MG tablet Commonly known as: COREG  Take 1 tablet (12.5 mg total) by mouth 2 (two) times daily.   CRANBERRY PO Take 2 tablets by mouth daily.   EPINEPHrine  0.3 mg/0.3 mL Soaj injection Commonly known as: EPI-PEN Use as directed for life-threatening allergic reaction.   ezetimibe  10 MG tablet Commonly known as: ZETIA  Take 1 tablet (10 mg total) by mouth every evening.   FISH OIL PO Take 1 capsule by mouth daily.   FreeStyle Libre 2 Sensor Misc Inject 1 Device into the skin every 14 (fourteen) days.   gabapentin  100 MG capsule Commonly known as: NEURONTIN  Take 1 capsule (100 mg total) by mouth 2 (two) times daily.   Gvoke HypoPen  2-Pack 0.5 MG/0.1ML Soaj Generic drug: Glucagon  Inject 0.5 mg into the skin daily as needed.   HYDROcodone -acetaminophen  7.5-325 MG tablet Commonly known as: NORCO Take 1 tablet by mouth 3 (three) times daily as needed. What changed:  when to take this additional instructions   Lantus  SoloStar 100 UNIT/ML Solostar Pen Generic drug: insulin  glargine Inject 40 Units into the skin daily.   latanoprost  0.005 % ophthalmic solution Commonly known as: XALATAN  Place 1 drop into both eyes at bedtime.   Linzess  145 MCG Caps capsule Generic drug: linaclotide  Take 1 capsule (145 mcg total) by mouth daily before breakfast. What changed:  when to take this reasons to take this   LORazepam  0.5 MG tablet Commonly known as:  ATIVAN  Take 1 tablet (0.5 mg total) by mouth daily as needed for anxiety. What changed:  when to take this additional instructions   magnesium  oxide 400 MG tablet Commonly known as: MAG-OX Take 2 tablets (800 mg total) by mouth daily. Take 2 (400 mg) daily=800 mg What changed: additional instructions   nitroGLYCERIN  0.4 MG SL tablet Commonly known as: NITROSTAT  DISSOLVE ONE TABLET UNDER THE TONGUE EVERY 5 MINUTES AS NEEDED FOR CHEST PAIN.  DO NOT EXCEED A TOTAL OF 3 DOSES IN 15 MINUTES What changed:  how much to take how to take this when to take this reasons to take this additional instructions   NovoLOG  FlexPen 100 UNIT/ML FlexPen Generic drug: insulin  aspart Inject 2 - 10 Units before meals based on SSI. What changed:  how much to take how to take this when to take this additional instructions   OXYGEN  Inhale 2 L/min into the lungs at bedtime.   pantoprazole  40 MG tablet Commonly known as: PROTONIX  Take 1 tablet (40 mg total) by mouth daily. What changed: when to take this   potassium chloride  SA 20 MEQ tablet Commonly known as: KLOR-CON  M Take 2 tablets (40 mEq total) by mouth daily. What changed:  how much to take when to take this additional instructions  PRESCRIPTION MEDICATION See admin instructions. CPAP- At bedtime   Probiotic Caps Take 1 capsule by mouth daily.   promethazine  25 MG tablet Commonly known as: PHENERGAN  Take 1 tablet (25 mg total) by mouth daily as needed. What changed: reasons to take this   Repatha  SureClick 140 MG/ML Soaj Generic drug: Evolocumab  Inject 140 mg into the skin every 14 (fourteen) days.   ticagrelor  90 MG Tabs tablet Commonly known as: BRILINTA  Take 1 tablet (90 mg total) by mouth 2 (two) times daily.   torsemide  20 MG tablet Commonly known as: DEMADEX  Take 3 tablets (60 mg total) by mouth 2 (two) times daily for 3 days, THEN 3 tablets (60 mg total) daily. Start taking on: July 20, 2023 What changed: See  the new instructions.        Follow-up Information     Cox, Kirsten, MD. Schedule an appointment as soon as possible for a visit in 1 week(s).   Specialty: Family Medicine Why: For hospital follow-up Contact information: [address] [place] [address] [phone]         Bensimhon, Toribio SAUNDERS, MD. Schedule an appointment as soon as possible for a visit in 1 week(s).   Specialty: Cardiology Contact information: [address] [place] [address] [phone]         Fleurette Brow, MD. Schedule an appointment as soon as possible for a visit.   Specialty: Nephrology Why: For hospital follow-up Contact information: [address] [place] [address] [phone]                Discharge Exam: BP (!) 151/53 (BP Location: Right Wrist)   Pulse 65   Temp 97.9 F (36.6 C) (Oral)   Resp 18   Ht 5' 4.5 (1.638 m)   Wt 107.2 kg   SpO2 92%   BMI 39.94 kg/m   General exam: Appears calm and comfortable Respiratory system: Clear to auscultation. Respiratory effort normal. Cardiovascular system: S1 & S2 heard, RRR. Gastrointestinal system: Abdomen is nondistended, soft and nontender. Normal bowel sounds heard. Central nervous system: Alert and oriented. No focal neurological deficits. Musculoskeletal: BLE edema. No calf tenderness Psychiatry: Judgement and insight appear normal. Mood & affect appropriate.   Condition at discharge: stable  The results of significant diagnostics from this hospitalization (including imaging, microbiology, ancillary and laboratory) are listed below for reference.   Imaging Studies: CARDIAC CATHETERIZATION Result Date: 07/18/2023   Mid LAD to Dist LAD lesion is 60% stenosed.   Mid LAD lesion is 90% stenosed.   Mid Cx to Dist Cx lesion is 100% stenosed.   1st Mrg-1 lesion is 30% stenosed.   1st Mrg-2 lesion is 90% stenosed.   Non-stenotic Mid Cx lesion was previously treated.   Scoring  balloon angioplasty was performed using a BALLN SCOREFLEX 3.0X10.   Post intervention, there is a 10% residual stenosis. Severe restenosis of a focal segment of the proximal LAD stented segment. Successful scoring balloon angioplasty of the proximal LAD stented segment. Recommendations: She did well with her procedure. This will most likely not be a long lasting result. In July 2024 IVUS was performed and the stents are all well expanded in the proximal LAD. Scoring balloon angioplasty today alone. No new stents placed. Continue current medical therapy including ASA and Brilinta . Gentle hydration overnight and hopeful d/c home tomorrow.   CARDIAC CATHETERIZATION Result Date: 07/16/2023 Severe restenosis in the proximal LAD stent. There are two layers of  stent in this segment (placed in February 2024 and July 2024). The distal LAD is small in caliber. Chronic occlusion mid Circumflex beyond the stented segment. Patent obtuse marginal branch with severe diffuse distal stenosis (too small for PCI) Moderate caliber dominant RCA with moderate ostial/proximal stenosis, patent distal stent. LVEDP=15 mmHg Recommendations: She will need PCI of the proximal LAD stented segment. This PCI will be staged given her advanced kidney disease. Continue IV NTG.   US  RENAL Result Date: 07/15/2023 CLINICAL DATA:  Acute kidney injury. EXAM: RENAL / URINARY TRACT ULTRASOUND COMPLETE COMPARISON:  None Available. FINDINGS: Right Kidney: Renal measurements: 11.5 cm x 3.7 cm x 4.7 cm = volume: 102.65 mL. Diffusely increased echogenicity of the renal parenchyma is noted. No mass or hydronephrosis visualized. Left Kidney: Renal measurements: 11.1 cm x 4.7 cm x 4.7 cm = volume: 128.04 mL. Diffusely increased echogenicity of the renal parenchyma is noted. No mass or hydronephrosis visualized. Bladder: Appears normal for degree of bladder distention. Other: None. IMPRESSION: Bilateral echogenic kidneys which may represent sequelae  associated with medical renal disease. Electronically Signed   By: Suzen Dials M.D.   On: 07/15/2023 21:49   ECHOCARDIOGRAM LIMITED Result Date: 07/15/2023    ECHOCARDIOGRAM LIMITED REPORT   Patient Name:   JENISE IANNELLI Date of Exam: 07/15/2023 Medical Rec #:  [id number]      Height:       65.0 in Accession #:    [id number]     Weight:       231.0 lb Date of Birth:  [birth year]      BSA:          2.103 m Patient Age:    62 years       BP:           141/58 mmHg Patient Gender: F              HR:           71 bpm. Exam Location:  Inpatient Procedure: Limited Echo, Intracardiac Opacification Agent, Limited Color Doppler            and Cardiac Doppler Indications:     CHF  History:         Patient has prior history of Echocardiogram examinations, most                  recent 12/09/2022. CHF, CAD and Previous Myocardial Infarction,                  Mitral Valve Disease; Risk Factors:Dyslipidemia and Former                  Smoker.                   Mitral Valve: Mitra-Clip valve is present in the mitral                  position.  Sonographer:     Tillman Nora RVT RCS Referring Phys:  ALEENE JINNY PASSE Diagnosing Phys: Annabella Scarce MD IMPRESSIONS  1. Left ventricular ejection fraction, by estimation, is 55 to 60%. The left ventricle has normal function. The left ventricle has no regional wall motion abnormalities. There is mild left ventricular hypertrophy of the lateral segment. Left ventricular  diastolic function could not be evaluated. There is the interventricular septum is flattened in systole, consistent with right ventricular pressure overload.  2. Right ventricular systolic function is normal. The right ventricular size is normal.  There is normal pulmonary artery systolic pressure.  3. Left atrial size was moderately dilated.  4. Mitral valve gradient unchanged from echo 11/2022. The mitral valve has been repaired/replaced. Mild mitral valve regurgitation. No evidence of mitral stenosis. The mean mitral  valve gradient is 5.0 mmHg. There is a Mitra-Clip present in the mitral position.  5. The aortic valve is normal in structure. Aortic valve regurgitation is not visualized. No aortic stenosis is present.  6. The inferior vena cava is dilated in size with <50% respiratory variability, suggesting right atrial pressure of 15 mmHg. FINDINGS  Left Ventricle: Left ventricular ejection fraction, by estimation, is 55 to 60%. The left ventricle has normal function. The left ventricle has no regional wall motion abnormalities. Definity  contrast agent was given IV to delineate the left ventricular  endocardial borders. The left ventricular internal cavity size was normal in size. There is mild left ventricular hypertrophy of the lateral segment. The interventricular septum is flattened in systole, consistent with right ventricular pressure overload. Left ventricular diastolic function could not be evaluated. Left ventricular diastolic function could not be evaluated due to mitral valve repair. Right Ventricle: The right ventricular size is normal. No increase in right ventricular wall thickness. Right ventricular systolic function is normal. There is normal pulmonary artery systolic pressure. The tricuspid regurgitant velocity is 1.61 m/s, and  with an assumed right atrial pressure of 15 mmHg, the estimated right ventricular systolic pressure is 25.4 mmHg. Left Atrium: Left atrial size was moderately dilated. Right Atrium: Right atrial size was normal in size. Pericardium: There is no evidence of pericardial effusion. Mitral Valve: Mitral valve gradient unchanged from echo 11/2022. The mitral valve has been repaired/replaced. Mild mitral valve regurgitation. There is a Mitra-Clip present in the mitral position. No evidence of mitral valve stenosis. MV peak gradient, 18.7 mmHg. The mean mitral valve gradient is 5.0 mmHg with average heart rate of 71 bpm. Tricuspid Valve: The tricuspid valve is normal in structure. Tricuspid  valve regurgitation is trivial. No evidence of tricuspid stenosis. Aortic Valve: The aortic valve is normal in structure. Aortic valve regurgitation is not visualized. No aortic stenosis is present. Pulmonic Valve: The pulmonic valve was normal in structure. Pulmonic valve regurgitation is not visualized. No evidence of pulmonic stenosis. Aorta: The aortic root is normal in size and structure. Venous: The inferior vena cava is dilated in size with less than 50% respiratory variability, suggesting right atrial pressure of 15 mmHg. IAS/Shunts: No atrial level shunt detected by color flow Doppler. Additional Comments: Spectral Doppler performed. Color Doppler performed.  LEFT VENTRICLE PLAX 2D LVIDd:         5.00 cm LVIDs:         3.40 cm LV PW:         1.20 cm LV IVS:        1.00 cm LVOT diam:     1.70 cm LVOT Area:     2.27 cm  IVC IVC diam: 2.30 cm LEFT ATRIUM             Index LA diam:        3.20 cm 1.52 cm/m LA Vol (A2C):   85.8 ml 40.79 ml/m LA Vol (A4C):   63.2 ml 30.05 ml/m LA Biplane Vol: 79.3 ml 37.70 ml/m                        PULMONIC VALVE AORTA  PV Vmax:       0.92 m/s Ao Root diam: 2.70 cm PV Peak grad:  3.4 mmHg Ao Asc diam:  3.40 cm  MITRAL VALVE                TRICUSPID VALVE MV Area (PHT): 3.72 cm     TR Peak grad:   10.4 mmHg MV Peak grad:  18.7 mmHg    TR Vmax:        161.00 cm/s MV Mean grad:  5.0 mmHg MV Vmax:       2.16 m/s     SHUNTS MV Vmean:      97.4 cm/s    Systemic Diam: 1.70 cm MV Decel Time: 204 msec MV E velocity: 221.00 cm/s MV A velocity: 101.00 cm/s MV E/A ratio:  2.19 Annabella Scarce MD Electronically signed by Annabella Scarce MD Signature Date/Time: 07/15/2023/6:24:49 PM    Final (Updated)    CT Angio Chest/Abd/Pel for Dissection W and/or Wo Contrast Result Date: 07/13/2023 CLINICAL DATA:  Back and chest pain EXAM: CT ANGIOGRAPHY CHEST, ABDOMEN AND PELVIS TECHNIQUE: Non-contrast CT of the chest was initially obtained. Multidetector CT imaging through  the chest, abdomen and pelvis was performed using the standard protocol during bolus administration of intravenous contrast. Multiplanar reconstructed images and MIPs were obtained and reviewed to evaluate the vascular anatomy. RADIATION DOSE REDUCTION: This exam was performed according to the departmental dose-optimization program which includes automated exposure control, adjustment of the mA and/or kV according to patient size and/or use of iterative reconstruction technique. CONTRAST:  75mL OMNIPAQUE  IOHEXOL  350 MG/ML SOLN COMPARISON:  08/29/2020, 07/13/2023 FINDINGS: CTA CHEST FINDINGS Cardiovascular: No evidence of thoracic aortic aneurysm or dissection. Borderline cardiomegaly without pericardial effusion. Post therapeutic changes from mitral valve repair. Diffuse atherosclerosis of the coronary vasculature. Chronic retained 1.3 cm metallic object within the left lower lobe pulmonary artery, stable since the 08/29/2020 exam, likely pulmonary arterial pressure monitor. No other filling defects or pulmonary emboli. Mediastinum/Nodes: Persistent borderline enlarged mediastinal and hilar lymph nodes, not significantly changed since prior exam. Index lymph node in the pretracheal region image 26/6 measures 14 mm in short axis, previously 12 mm. Thyroid , trachea, and esophagus are unremarkable. Lungs/Pleura: The diffuse interlobular septal thickening, greatest at the bases, consistent with mild interstitial edema. Scattered hypoventilatory changes at the lung bases. No airspace disease, effusion, or pneumothorax. Central airways are patent. Musculoskeletal: No acute or destructive bony abnormalities. Prior healed bilateral rib fractures. Reconstructed images demonstrate no additional findings. Review of the MIP images confirms the above findings. CTA ABDOMEN AND PELVIS FINDINGS VASCULAR Aorta: Normal caliber aorta without aneurysm, dissection, vasculitis or significant stenosis. Mild atherosclerosis. Celiac: Patent  without evidence of aneurysm, dissection, vasculitis or significant stenosis. Diffuse atherosclerosis. SMA: Patent without evidence of aneurysm, dissection, vasculitis or significant stenosis. Diffuse atherosclerosis. Renals: Accessory right renal artery is noted. The bilateral renal arteries are patent without evidence of aneurysm, dissection, vasculitis, fibromuscular dysplasia or significant stenosis. IMA: Patent without evidence of aneurysm, dissection, vasculitis or significant stenosis. Inflow: Patent without evidence of aneurysm, dissection, vasculitis or significant stenosis. Diffuse atherosclerosis. Veins: No obvious venous abnormality within the limitations of this arterial phase study. Review of the MIP images confirms the above findings. NON-VASCULAR Hepatobiliary: No focal liver abnormality is seen. Status post cholecystectomy. No biliary dilatation. Pancreas: Unremarkable. No pancreatic ductal dilatation or surrounding inflammatory changes. Spleen: Normal in size without focal abnormality. Adrenals/Urinary Tract: Adrenal glands are unremarkable. Kidneys are normal, without renal calculi, focal lesion, or hydronephrosis.  There is gas within the bladder lumen, please correlate with any recent instrumentation or catheterization. Stomach/Bowel: No bowel obstruction or ileus. No bowel wall thickening or inflammatory change. Lymphatic: No pathologic adenopathy within the abdomen or pelvis. Reproductive: Status post hysterectomy. No adnexal masses. Other: Trace free fluid within the right upper quadrant, nonspecific. No free intraperitoneal gas. No abdominal wall hernia. Musculoskeletal: No acute or destructive bony abnormalities. Reconstructed images demonstrate no additional findings. Review of the MIP images confirms the above findings. IMPRESSION: Vascular 1. No evidence of thoracoabdominal aortic aneurysm or dissection. 2. No evidence of acute pulmonary embolus. 3. Aortic Atherosclerosis (ICD10-I70.0).  Coronary artery atherosclerosis. Nonvascular: 1. Diffuse interlobular septal thickening favoring interstitial edema. 2. Stable mediastinal and hilar lymphadenopathy, nonspecific. 3. Trace ascites right upper quadrant, nonspecific. 4. Gas within the bladder lumen, likely due to recent catheterization or instrumentation. Electronically Signed   By: Ozell Daring M.D.   On: 07/13/2023 16:37   DG Chest 2 View Result Date: 07/13/2023 CLINICAL DATA:  Chest pain. EXAM: CHEST - 2 VIEW COMPARISON:  02/15/2023 FINDINGS: Stable mild cardiomegaly. Stable pulmonary vascular congestion. No evidence of acute infiltrate or pleural effusion. IMPRESSION: Stable mild cardiomegaly and pulmonary vascular congestion. No active infiltrate. Electronically Signed   By: Norleen DELENA Kil M.D.   On: 07/13/2023 14:25    Microbiology: Results for orders placed or performed during the hospital encounter of 07/13/23  Urine Culture     Status: Abnormal   Collection Time: 07/13/23  4:45 PM   Specimen: Urine, Random  Result Value Ref Range Status   Specimen Description URINE, RANDOM  Final   Special Requests   Final    NONE Reflexed from [id number] Performed at [facility], [address] [place], [place] [address]    Culture >=100,000 COLONIES/mL KLEBSIELLA PNEUMONIAE (A)  Final   Report Status 07/15/2023 FINAL  Final   Organism ID, Bacteria KLEBSIELLA PNEUMONIAE (A)  Final      Susceptibility   Klebsiella pneumoniae - MIC*    AMPICILLIN >=32 RESISTANT Resistant     CEFAZOLIN  <=4 SENSITIVE Sensitive     CEFEPIME <=0.12 SENSITIVE Sensitive     CEFTRIAXONE  <=0.25 SENSITIVE Sensitive     CIPROFLOXACIN  <=0.25 SENSITIVE Sensitive     GENTAMICIN <=1 SENSITIVE Sensitive     IMIPENEM <=0.25 SENSITIVE Sensitive     NITROFURANTOIN  64 INTERMEDIATE Intermediate     TRIMETH /SULFA  <=20 SENSITIVE Sensitive     AMPICILLIN/SULBACTAM 8 SENSITIVE Sensitive     PIP/TAZO <=4 SENSITIVE Sensitive ug/mL    * >=100,000 COLONIES/mL  KLEBSIELLA PNEUMONIAE    Labs: CBC: Recent Labs  Lab 07/14/23 0512 07/15/23 1001 07/19/23 0524 07/19/23 1634 07/20/23 0821  WBC 10.0 9.9 10.1  --  9.0  HGB 9.3* 9.2* 7.9* 8.7* 8.5*  HCT 29.6* 29.5* 25.3* 28.5* 26.5*  MCV 88.6 89.1 88.5  --  87.7  PLT 239 237 212  --  223   Basic Metabolic Panel: Recent Labs  Lab 07/16/23 0248 07/17/23 0307 07/18/23 0249 07/19/23 0524 07/20/23 0348  NA 138 137 138 139 139  K 4.7 5.4* 4.2 4.3 4.2  CL 110 110 106 104 105  CO2 19* 18* 22 25 25   GLUCOSE 73 332* 146* 168* 103*  BUN 68* 60* 65* 61* 64*  CREATININE 2.65* 2.41* 2.76* 2.81* 2.61*  CALCIUM  8.4* 8.4* 8.6* 8.4* 8.5*    CBG: Recent Labs  Lab 07/19/23 1225 07/19/23 1619 07/19/23 2137 07/20/23 0731 07/20/23 1233  GLUCAP 94 125* 131* 97 106*  Discharge time spent: 35 minutes.  Signed: Elgin Lam, MD [facility] Hospitalists 07/20/2023

## 2023-07-20 NOTE — Progress Notes (Signed)
 PROGRESS NOTE    Dana [name]  FMW:[id number] DOB: 05-Feb-1961 DOA: 07/13/2023 PCP: Sherre Clapper, MD   Brief Narrative: Dana [name] is a 63 y.o. female with a history of anxiety, chronic back pain, CKD stage IIIb, OSA on CPAP, COPD, diastolic heart failure, primary hypertension, hyperlipidemia, gout, GERD, type 1 diabetes mellitus, multivessel CAD/NSTEMI status post multiple PCI.  Patient presented secondary to chest pain consistent with angina secondary to patient's underlying CAD.  Cardiology consulted and patient started on nitroglycerin  drip. Heart catheterization with balloon angioplasty of LAD performed on 1/2.   Assessment and Plan:  Angina CAD Patient with history of multivessel disease and prior NSTEMI status post multiple PCI.  Chest pain this admission consistent with prior anginal episodes and relieved with nitroglycerin .  Cardiology consulted.  Cardiac catheterization on 12/31 was significant for proximal LAD restenosis with recommendation for PCI of the proximal LAD. Successful balloon angioplasty performed on 1/2. -Cardiology recommendations: Lasix  IV -Continue aspirin  and Brilinta   Klebsiella pneumonia UTI Patient completed course of ceftriaxone  and transitioned to Duricef.  Diabetes mellitus type 1 Uncontrolled with hyperglycemia.  Hemoglobin A1c of 9.0%.  Patient is managed on Lantus  as an outpatient. -Continue Semglee  50 units and SSI  AKI on CKD stage IV Patient follows with nephrology as an outpatient.  History of CIN.  Creatinine has been as high as 2.76 baseline appears to be around 2.0. -Continue nephrology recommendations  Acute on chronic anemia Unclear etiology for acute drop. Patient with baseline hemoglobin of around 8-10. Hemoglobin down from 9.2 g/dL to 7.9 g/dL overnight. Patient is on aspirin  and Brilinta . No source of hemorrhage identified. Repeat hemoglobin stable at 8.5 g/dL   DVT prophylaxis: Lovenox  Code Status:   Code Status: Full  Code Family Communication: None at bedside.  Disposition Plan: Discharge pending ongoing cardiology recommendations with transition off Lasix  IV and wean to room air   Consultants:  Cardiology Nephrology  Procedures:  Cardiac catheterization  Antimicrobials: Ceftriaxone  Cefadroxil    Subjective: Patient without issues overnight. Breathing well. No chest pain. Has been up to the bathroom with some dyspnea on return.  Objective: BP (!) 119/47 (BP Location: Right Wrist)   Pulse 61   Temp 97.8 F (36.6 C) (Oral)   Resp 20   Ht 5' 4.5 (1.638 m)   Wt 107.2 kg   SpO2 94%   BMI 39.94 kg/m   Examination:  General exam: Appears calm and comfortable Respiratory system: Clear to auscultation. Respiratory effort normal. Cardiovascular system: S1 & S2 heard, RRR. Gastrointestinal system: Abdomen is nondistended, soft and nontender. Normal bowel sounds heard. Central nervous system: Alert and oriented. No focal neurological deficits. Musculoskeletal: BLE edema. No calf tenderness Psychiatry: Judgement and insight appear normal. Mood & affect appropriate.    Data Reviewed: I have personally reviewed following labs and imaging studies  CBC Lab Results  Component Value Date   WBC 9.0 07/20/2023   RBC 3.02 (L) 07/20/2023   HGB 8.5 (L) 07/20/2023   HCT 26.5 (L) 07/20/2023   MCV 87.7 07/20/2023   MCH 28.1 07/20/2023   PLT 223 07/20/2023   MCHC 32.1 07/20/2023   RDW 14.5 07/20/2023   LYMPHSABS 1.8 07/12/2023   MONOABS 1.0 12/06/2022   EOSABS 0.3 07/12/2023   BASOSABS 0.1 07/12/2023     Last metabolic panel Lab Results  Component Value Date   NA 139 07/20/2023   K 4.2 07/20/2023   CL 105 07/20/2023   CO2 25 07/20/2023   BUN 64 (H) 07/20/2023  CREATININE 2.61 (H) 07/20/2023   GLUCOSE 103 (H) 07/20/2023   GFRNONAA 20 (L) 07/20/2023   GFRAA 43 (L) 06/28/2020   CALCIUM  8.5 (L) 07/20/2023   PHOS 3.9 02/20/2023   PROT 7.3 07/12/2023   ALBUMIN  4.0 07/12/2023    LABGLOB 3.3 07/12/2023   AGRATIO 1.2 12/19/2022   BILITOT 0.5 07/12/2023   ALKPHOS 226 (H) 07/12/2023   AST 21 07/12/2023   ALT 25 07/12/2023   ANIONGAP 9 07/20/2023    GFR: Estimated Creatinine Clearance: 27.6 mL/min (A) (by C-G formula based on SCr of 2.61 mg/dL (H)).  Recent Results (from the past 240 hours)  Urine Culture     Status: Abnormal   Collection Time: 07/13/23  4:45 PM   Specimen: Urine, Random  Result Value Ref Range Status   Specimen Description URINE, RANDOM  Final   Special Requests   Final    NONE Reflexed from [id number] Performed at [facility], [address] [place], [place] [address]    Culture >=100,000 COLONIES/mL KLEBSIELLA PNEUMONIAE (A)  Final   Report Status 07/15/2023 FINAL  Final   Organism ID, Bacteria KLEBSIELLA PNEUMONIAE (A)  Final      Susceptibility   Klebsiella pneumoniae - MIC*    AMPICILLIN >=32 RESISTANT Resistant     CEFAZOLIN  <=4 SENSITIVE Sensitive     CEFEPIME <=0.12 SENSITIVE Sensitive     CEFTRIAXONE  <=0.25 SENSITIVE Sensitive     CIPROFLOXACIN  <=0.25 SENSITIVE Sensitive     GENTAMICIN <=1 SENSITIVE Sensitive     IMIPENEM <=0.25 SENSITIVE Sensitive     NITROFURANTOIN  64 INTERMEDIATE Intermediate     TRIMETH /SULFA  <=20 SENSITIVE Sensitive     AMPICILLIN/SULBACTAM 8 SENSITIVE Sensitive     PIP/TAZO <=4 SENSITIVE Sensitive ug/mL    * >=100,000 COLONIES/mL KLEBSIELLA PNEUMONIAE      Radiology Studies: CARDIAC CATHETERIZATION Result Date: 07/18/2023   Mid LAD to Dist LAD lesion is 60% stenosed.   Mid LAD lesion is 90% stenosed.   Mid Cx to Dist Cx lesion is 100% stenosed.   1st Mrg-1 lesion is 30% stenosed.   1st Mrg-2 lesion is 90% stenosed.   Non-stenotic Mid Cx lesion was previously treated.   Scoring balloon angioplasty was performed using a BALLN SCOREFLEX 3.0X10.   Post intervention, there is a 10% residual stenosis. Severe restenosis of a focal segment of the proximal LAD stented segment. Successful scoring balloon  angioplasty of the proximal LAD stented segment. Recommendations: She did well with her procedure. This will most likely not be a long lasting result. In July 2024 IVUS was performed and the stents are all well expanded in the proximal LAD. Scoring balloon angioplasty today alone. No new stents placed. Continue current medical therapy including ASA and Brilinta . Gentle hydration overnight and hopeful d/c home tomorrow.      LOS: 7 days    Elgin Lam, MD [facility] 07/20/2023, 9:36 AM   If 7PM-7AM, please contact night-coverage [email or web address]

## 2023-07-20 NOTE — Progress Notes (Signed)
 Progress Note  Patient Name: Dana [name] Date of Encounter: 07/20/2023  Primary Cardiologist: Toribio Fuel, MD  Interval Summary   Chart reviewed.  No chest pain or shortness of breath.  Ambulated with PT this morning without oxygen  desaturation or angina.  She is eager to go home.  Vital Signs    Vitals:   07/20/23 0625 07/20/23 0700 07/20/23 0805 07/20/23 0823  BP: (!) 119/47     Pulse: 67 62 61   Resp: 20     Temp: 97.8 F (36.6 C)     TempSrc: Oral     SpO2: 95% 96% 97% 94%  Weight:      Height:        Intake/Output Summary (Last 24 hours) at 07/20/2023 1300 Last data filed at 07/20/2023 0458 Gross per 24 hour  Intake --  Output 2500 ml  Net -2500 ml   Filed Weights   07/17/23 0606 07/18/23 0611 07/18/23 1941  Weight: 110.7 kg 110 kg 107.2 kg    Physical Exam   GEN: No acute distress.   Neck: No JVD. Cardiac: RRR, no murmur, rub, or gallop.  Respiratory: Nonlabored. Clear to auscultation bilaterally. GI: Soft, nontender, bowel sounds present. MS: No edema.  ECG/Telemetry    Telemetry reviewed showing sinus rhythm.  Labs    Chemistry Recent Labs  Lab 07/18/23 0249 07/19/23 0524 07/20/23 0348  NA 138 139 139  K 4.2 4.3 4.2  CL 106 104 105  CO2 22 25 25   GLUCOSE 146* 168* 103*  BUN 65* 61* 64*  CREATININE 2.76* 2.81* 2.61*  CALCIUM  8.6* 8.4* 8.5*  GFRNONAA 19* 19* 20*  ANIONGAP 10 10 9     Hematology Recent Labs  Lab 07/15/23 1001 07/19/23 0524 07/19/23 1634 07/20/23 0821  WBC 9.9 10.1  --  9.0  RBC 3.31* 2.86*  --  3.02*  HGB 9.2* 7.9* 8.7* 8.5*  HCT 29.5* 25.3* 28.5* 26.5*  MCV 89.1 88.5  --  87.7  MCH 27.8 27.6  --  28.1  MCHC 31.2 31.2  --  32.1  RDW 14.6 14.5  --  14.5  PLT 237 212  --  223   Cardiac Enzymes Recent Labs  Lab 07/13/23 1325 07/13/23 1505  TROPONINIHS 21* 31*   Lipid Panel     Component Value Date/Time   CHOL 92 (L) 07/12/2023 1105   TRIG 81 07/12/2023 1105   HDL 47 07/12/2023 1105   CHOLHDL  2.0 07/12/2023 1105   CHOLHDL 2.4 09/30/2020 0241   VLDL 15 09/30/2020 0241   LDLCALC 28 07/12/2023 1105   LABVLDL 17 07/12/2023 1105    Cardiac Studies   Echocardiogram 07/15/2019:  1. Left ventricular ejection fraction, by estimation, is 55 to 60%. The  left ventricle has normal function. The left ventricle has no regional  wall motion abnormalities. There is mild left ventricular hypertrophy of  the lateral segment. Left ventricular   diastolic function could not be evaluated. There is the interventricular  septum is flattened in systole, consistent with right ventricular pressure  overload.   2. Right ventricular systolic function is normal. The right ventricular  size is normal. There is normal pulmonary artery systolic pressure.   3. Left atrial size was moderately dilated.   4. Mitral valve gradient unchanged from echo 11/2022. The mitral valve has  been repaired/replaced. Mild mitral valve regurgitation. No evidence of  mitral stenosis. The mean mitral valve gradient is 5.0 mmHg. There is a  Mitra-Clip present in the  mitral  position.   5. The aortic valve is normal in structure. Aortic valve regurgitation is  not visualized. No aortic stenosis is present.   6. The inferior vena cava is dilated in size with <50% respiratory  variability, suggesting right atrial pressure of 15 mmHg.   Cardiac catheterization/PCI 07/18/2023:   Mid LAD to Dist LAD lesion is 60% stenosed.   Mid LAD lesion is 90% stenosed.   Mid Cx to Dist Cx lesion is 100% stenosed.   1st Mrg-1 lesion is 30% stenosed.   1st Mrg-2 lesion is 90% stenosed.   Non-stenotic Mid Cx lesion was previously treated.   Scoring balloon angioplasty was performed using a BALLN SCOREFLEX 3.0X10.   Post intervention, there is a 10% residual stenosis.   Severe restenosis of a focal segment of the proximal LAD stented segment.  Successful scoring balloon angioplasty of the proximal LAD stented segment.   Assessment & Plan    1.  Multivessel CAD status post prior PCI, most recently DES to the proximal LAD in August 2024 prior to the current hospitalization.  Now status post scoring balloon angioplasty of the proximal LAD for treatment of in-stent restenosis January 2.  Remaining disease managed medically at this point.  2.  HFpEF, LVEF 55 to 60%.  Responded well to IV Lasix .  3.  CKD stage IV, creatinine 2.61 with GFR 20.  Followed by nephrology.  4.  History of mitral regurgitation status post TEER follow-up echocardiogram shows mild mitral regurgitation with mean MV gradient 5 mmHg.    5.  OSA on CPAP.  Patient stable from discharge from a cardiac perspective.  Ambulated well with PT this morning.  Currently on aspirin , Brilinta , Lovaza , Coreg , and Zetia .  Switched back to oral diuretics in the form of Demadex  by nephrology today.  Please schedule follow-up with cardiology within the next 7 to 10 days.  For questions or updates, please contact [facility] Please consult [email or web address] for contact info under   Signed, Jayson Sierras, MD  07/20/2023, 1:00 PM

## 2023-07-23 DIAGNOSIS — F411 Generalized anxiety disorder: Secondary | ICD-10-CM | POA: Diagnosis not present

## 2023-07-24 ENCOUNTER — Telehealth (HOSPITAL_COMMUNITY): Payer: Self-pay | Admitting: *Deleted

## 2023-07-24 NOTE — Telephone Encounter (Signed)
 Cardiac Rehab Referral faxed to [facility] per pt request. Karlene Lineman RN, BSN Cardiac and Pulmonary Rehab Nurse Navigator

## 2023-07-25 ENCOUNTER — Other Ambulatory Visit (HOSPITAL_COMMUNITY): Payer: Self-pay

## 2023-07-25 ENCOUNTER — Other Ambulatory Visit: Payer: Self-pay | Admitting: Family Medicine

## 2023-07-25 MED ORDER — HYDROCODONE-ACETAMINOPHEN 7.5-325 MG PO TABS
1.0000 | ORAL_TABLET | Freq: Three times a day (TID) | ORAL | 0 refills | Status: DC | PRN
  Filled 2023-07-25: qty 90, 30d supply, fill #0

## 2023-07-26 ENCOUNTER — Other Ambulatory Visit (HOSPITAL_COMMUNITY): Payer: Self-pay

## 2023-07-26 ENCOUNTER — Other Ambulatory Visit: Payer: Self-pay

## 2023-07-26 ENCOUNTER — Encounter (HOSPITAL_COMMUNITY): Payer: PPO | Admitting: Internal Medicine

## 2023-07-29 ENCOUNTER — Ambulatory Visit (INDEPENDENT_AMBULATORY_CARE_PROVIDER_SITE_OTHER): Payer: PPO

## 2023-07-29 VITALS — BP 130/62 | HR 66 | Temp 97.6°F | Ht 64.5 in | Wt 226.8 lb

## 2023-07-29 DIAGNOSIS — E1022 Type 1 diabetes mellitus with diabetic chronic kidney disease: Secondary | ICD-10-CM | POA: Diagnosis not present

## 2023-07-29 DIAGNOSIS — D631 Anemia in chronic kidney disease: Secondary | ICD-10-CM | POA: Diagnosis not present

## 2023-07-29 DIAGNOSIS — Z23 Encounter for immunization: Secondary | ICD-10-CM | POA: Diagnosis not present

## 2023-07-29 DIAGNOSIS — Z09 Encounter for follow-up examination after completed treatment for conditions other than malignant neoplasm: Secondary | ICD-10-CM | POA: Insufficient documentation

## 2023-07-29 DIAGNOSIS — N184 Chronic kidney disease, stage 4 (severe): Secondary | ICD-10-CM

## 2023-07-29 DIAGNOSIS — I25118 Atherosclerotic heart disease of native coronary artery with other forms of angina pectoris: Secondary | ICD-10-CM | POA: Diagnosis not present

## 2023-07-29 NOTE — Assessment & Plan Note (Signed)
 Admitted from December 28 till July 20, 2023 for anginal symptoms in the setting of CAD, status post balloon angioplasty of LAD where she had in-stent restenosis. No medication changes.  Type 1 diabetes controlled with complications of CKD 4 with recent hypoglycemic episode during hospitalization. Blood sugars currently well-controlled. - Continue current diabetes management plan. - Monitor blood glucose levels closely.  General Health Maintenance Inquired about vaccinations due to upcoming grandchild. Last pneumococcal vaccine in 2015. - Administer PCV20 pneumococcal vaccine. - Recommend tetanus shot and shingles vaccine at the pharmacy due to her insurance   Follow-up - Schedule follow-up appointment with nephrologist after kidney function lab results. - Follow up with cardiologist regarding Imdur  and overall cardiac management.

## 2023-07-29 NOTE — Assessment & Plan Note (Signed)
 Experienced angina and underwent two cardiac catheterizations between December 28 and January 4. Severe restenosis of the proximal LAD stent identified. Staged intervention performed due to CKD, with successful scoring balloon angioplasty on January 2. No new stents placed. No current chest pain or exertional chest pain, and blood pressure is well-controlled. Informed consent included discussion of risks related to kidney function and the need for staged intervention. - Follow up with cardiologist to discuss resuming Imdur , considering previous discontinuation due to hypotension. - Monitor for recurrence of chest pain or angina symptoms.  She mentions that she was told by the cardiologist in the hospital that she probably should be on Imdur .  Patient thought she has been on Imdur  all along, but on reviewing her medication history, looks like she has not been on it for few months.  In her cardiologist note from April 16, 2023, there was mention of Imdur  being discontinued due to low blood pressures.  Advised that she is probably okay to stay off of Imdur  at this time as her angina has resolved after her balloon angioplasty during her recent hospitalization and her blood pressure is on the normal side.  She has an appointment with her cardiologist next week, she could discuss this with him at that time

## 2023-07-29 NOTE — Progress Notes (Signed)
 Subjective:  Patient ID: Dana [name], female    DOB: 1960/11/30  Age: 63 y.o. MRN: [id number]  Chief Complaint  Patient presents with   Hospitalization Follow-up    HPI   Follow up Hospitalization  Patient was admitted to [facility] on 07/13/2023 and discharged on 07/20/2023 She was treated for Angina Pectoris. Treatment for this included cardiac cath.  She reports excellent compliance with treatment. She reports this condition is improved.   The patient, with a history of coronary artery disease, diabetes, and chronic kidney disease, was recently hospitalized for angina. During the hospital stay, she underwent two cardiac catheterization procedures. The first procedure revealed severe restenosis of the proximal left anterior descending (LAD) stent. The second procedure, performed two days later, involved a successful scoring balloon angioplasty of the previously stented LAD segment. No new stents were placed. The staged intervention was reportedly due to the patient's kidney disease.  During the hospitalization, the patient also experienced fluid overload, leading to difficulty breathing. Despite this, the patient's kidney function improved during the hospital stay, with creatinine levels decreasing from 2.81 to 2.61. The patient also experienced an episode of hypoglycemia, which required intravenous glucose administration.  The patient reported resolution of chest pain post-procedure and denied any chest pain with exertion. She also mentioned a previous prescription for Imdur , an anti-anginal medication, which was discontinued due to low blood pressure. The patient was unsure when this medication was stopped and expressed confusion about its absence from her current medication list.  The patient also has a history of anemia, with a discharge hemoglobin level of 9.3, improved from 7.9 during the hospital stay. She is followed by a nephrologist for her chronic kidney disease and is due  for follow-up labs prior to her next appointment. The patient also expressed a need for updated vaccinations in anticipation of a new grandchild.      07/29/2023   10:57 AM 07/12/2023   10:39 AM 05/13/2023    3:54 PM 04/04/2023   10:52 AM 03/26/2023    3:03 PM  Depression screen PHQ 2/9  Decreased Interest 0 3 1 0 1  Down, Depressed, Hopeless 0 2 0 2 1  PHQ - 2 Score 0 5 1 2 2   Altered sleeping 3 3  3 1   Tired, decreased energy 1 3  3 1   Change in appetite 0 3  3 1   Feeling bad or failure about yourself  2 0  2 0  Trouble concentrating 1 0  2 0  Moving slowly or fidgety/restless 0 0  1 1  Suicidal thoughts 0 0  0 0  PHQ-9 Score 7 14  16 6   Difficult doing work/chores Somewhat difficult Somewhat difficult  Somewhat difficult Somewhat difficult        07/29/2023   10:57 AM  Fall Risk   Falls in the past year? 1  Number falls in past yr: 0  Injury with Fall? 0  Risk for fall due to : No Fall Risks    Patient Care Team: Sherre Clapper, MD as PCP - General (Family Medicine) Bensimhon, Toribio SAUNDERS, MD as PCP - Cardiology (Cardiology) Jeneal Danita Macintosh, MD as Consulting Physician (Allergy ) Bensimhon, Toribio SAUNDERS, MD as Consulting Physician (Cardiology) Gregory Kent, MD as Referring Physician (Gastroenterology) Claudene Elsie JUDITHANN Mickey., MD as Consulting Physician (Endocrinology) Myrick Elspeth PARAS., DPM as Consulting Physician (Podiatry) Mai Lynwood FALCON, MD as Consulting Physician (Rheumatology) Fleurette Brow, MD as Referring Physician (Nephrology) Thukkani, Arun K, MD as Consulting  Physician (Cardiology) Cornelius Brihana DEL, MD as Consulting Physician (Oncology) Nathanael Deatrice Barrio, MD as Referring Physician Claudene Elsie JUDITHANN Raddle., MD (Endocrinology) Frances Ozell RAMAN, LCSW as [facility] Care Management (Licensed Clinical Social Worker) Bertrum Rosina HERO, RN as [facility] Care Management (General Practice) Hugh Lamar KIDD (Ophthalmology)   Review of Systems   Constitutional:  Negative for appetite change, chills, fatigue and fever.  HENT:  Negative for congestion, ear discharge, ear pain, rhinorrhea, sinus pressure, sneezing and sore throat.   Eyes:  Negative for visual disturbance.  Respiratory:  Negative for cough, chest tightness, shortness of breath and wheezing.   Cardiovascular:  Negative for chest pain, palpitations and leg swelling.  Gastrointestinal:  Negative for abdominal pain, diarrhea, nausea and vomiting.  Endocrine: Negative for polydipsia, polyphagia and polyuria.  Genitourinary:  Negative for difficulty urinating, dysuria, frequency, hematuria, menstrual problem, urgency, vaginal bleeding, vaginal discharge and vaginal pain.  Musculoskeletal:  Negative for back pain, gait problem, joint swelling, myalgias and neck pain.  Neurological:  Negative for dizziness, seizures, syncope, weakness, numbness and headaches.  Psychiatric/Behavioral:  Negative for agitation, confusion, hallucinations, sleep disturbance and suicidal ideas. The patient is not nervous/anxious.     Current Outpatient Medications on File Prior to Visit  Medication Sig Dispense Refill   allopurinol  (ZYLOPRIM ) 100 MG tablet Take 0.5 tablets (50 mg total) by mouth daily. 90 tablet 1   aspirin  EC 81 MG tablet Take 1 tablet (81 mg total) by mouth daily. Swallow whole. 30 tablet 12   busPIRone  (BUSPAR ) 10 MG tablet Take 10 mg by mouth 3 (three) times daily.     carvedilol  (COREG ) 12.5 MG tablet Take 1 tablet (12.5 mg total) by mouth 2 (two) times daily. 180 tablet 1   Continuous Blood Gluc Sensor (FREESTYLE LIBRE 2 SENSOR) MISC Inject 1 Device into the skin every 14 (fourteen) days.     CRANBERRY PO Take 2 tablets by mouth daily.     EPINEPHrine  0.3 mg/0.3 mL IJ SOAJ injection Use as directed for life-threatening allergic reaction. 4 each 3   Evolocumab  (REPATHA  SURECLICK) 140 MG/ML SOAJ Inject 140 mg into the skin every 14 (fourteen) days. 6 mL 1   ezetimibe  (ZETIA ) 10  MG tablet Take 1 tablet (10 mg total) by mouth every evening. 90 tablet 1   gabapentin  (NEURONTIN ) 100 MG capsule Take 1 capsule (100 mg total) by mouth 2 (two) times daily. 180 capsule 0   Glucagon  (GVOKE HYPOPEN  2-PACK) 0.5 MG/0.1ML SOAJ Inject 0.5 mg into the skin daily as needed. (Patient not taking: Reported on 07/13/2023) 0.2 mL 5   HYDROcodone -acetaminophen  (NORCO) 7.5-325 MG tablet Take 1 tablet by mouth 3 (three) times daily as needed. 90 tablet 0   insulin  aspart (NOVOLOG  FLEXPEN) 100 UNIT/ML FlexPen Inject 2 - 10 Units before meals based on SSI. (Patient taking differently: Inject 2-10 Units into the skin See admin instructions. Inject 2-10 units into the skin before meals, based on SSI) 15 mL 3   insulin  glargine (LANTUS  SOLOSTAR) 100 UNIT/ML Solostar Pen Inject 40 Units into the skin daily.     Insulin  Pen Needle (BD PEN NEEDLE NANO 2ND GEN) 32G X 4 MM MISC Inject 1 each into the skin in the morning, at noon, in the evening, and at bedtime. 600 each 3   latanoprost  (XALATAN ) 0.005 % ophthalmic solution Place 1 drop into both eyes at bedtime.     linaclotide  (LINZESS ) 145 MCG CAPS capsule Take 1 capsule (145 mcg total) by mouth daily  before breakfast. (Patient taking differently: Take 145 mcg by mouth daily as needed (for constipation).) 90 capsule 1   LORazepam  (ATIVAN ) 0.5 MG tablet Take 1 tablet (0.5 mg total) by mouth daily as needed for anxiety. (Patient taking differently: Take 0.5 mg by mouth See admin instructions. Take 0.5 mg by mouth at bedtime and an additional 0.5 mg once a day as needed for anxiety) 90 tablet 1   magnesium  oxide (MAG-OX) 400 MG tablet Take 2 tablets (800 mg total) by mouth daily. Take 2 (400 mg) daily=800 mg (Patient taking differently: Take 800 mg by mouth daily.) 180 tablet 1   nitroGLYCERIN  (NITROSTAT ) 0.4 MG SL tablet DISSOLVE ONE TABLET UNDER THE TONGUE EVERY 5 MINUTES AS NEEDED FOR CHEST PAIN.  DO NOT EXCEED A TOTAL OF 3 DOSES IN 15 MINUTES (Patient taking  differently: Place 0.4 mg under the tongue every 5 (five) minutes x 3 doses as needed for chest pain.) 50 tablet 3   Omega-3 Fatty Acids (FISH OIL PO) Take 1 capsule by mouth daily.     OXYGEN  Inhale 2 L/min into the lungs at bedtime.     pantoprazole  (PROTONIX ) 40 MG tablet Take 1 tablet (40 mg total) by mouth daily. (Patient taking differently: Take 40 mg by mouth daily before breakfast.) 90 tablet 1   potassium chloride  SA (KLOR-CON  M) 20 MEQ tablet Take 2 tablets (40 mEq total) by mouth daily. (Patient taking differently: Take 20 mEq by mouth See admin instructions. Take 20 mEq by mouth on Tues and Sat ONLY) 180 tablet 1   PRESCRIPTION MEDICATION See admin instructions. CPAP- At bedtime     Probiotic CAPS Take 1 capsule by mouth daily.     promethazine  (PHENERGAN ) 25 MG tablet Take 1 tablet (25 mg total) by mouth daily as needed. (Patient taking differently: Take 25 mg by mouth daily as needed for nausea or vomiting.) 90 tablet 1   ticagrelor  (BRILINTA ) 90 MG TABS tablet Take 1 tablet (90 mg total) by mouth 2 (two) times daily. 180 tablet 1   torsemide  (DEMADEX ) 20 MG tablet Take 3 tablets (60 mg total) by mouth 2 (two) times daily for 3 days, THEN 3 tablets (60 mg total) daily. 108 tablet 0   No current facility-administered medications on file prior to visit.   Past Medical History:  Diagnosis Date   Acquired absence of left great toe (HCC)    Acquired absence of other left toe(s) (HCC)    ACS (acute coronary syndrome) (HCC)    Anemia    Anoxic brain injury (HCC)    Anxiety    CAD (coronary artery disease)    DES mLAD 04/07/15; DES dRCA & DES pPDA 08/30/16; DES mCX & PTCA OM1 09/29/20   Chronic diastolic (congestive) heart failure (HCC)    Chronic pain    Chronic systolic (congestive) heart failure (HCC)    CKD (chronic kidney disease)    Contrast dye induced nephropathy, possible 09/03/2020   COPD (chronic obstructive pulmonary disease) (HCC)    COVID-19 virus infection 03/03/2023    Diabetes (HCC)type 1    Diastolic CHF (HCC)    Dyspnea    Family history of breast cancer 10/23/2021   Gastro-esophageal reflux disease without esophagitis    Hyperlipidemia    Hypertension    Hypertensive heart disease with heart failure (HCC)    Hypoxemia    Idiopathic gout, multiple sites    Leukocytosis 03/29/2022   Localized edema    Low back pain  Mitral regurgitation    Mitral regurgitation    Mixed hyperlipidemia    Mixed incontinence    Morbid (severe) obesity due to excess calories (HCC)    Neuromuscular disorder (HCC)    Neuropathy    Non-ST elevation (NSTEMI) myocardial infarction (HCC)    08/29/20, 09/29/20   OSA (obstructive sleep apnea) 09/03/2020   Other specified hypothyroidism    PAF (paroxysmal atrial fibrillation) (HCC)    s/p pulmonary vein isolation by cryoablation 10/04/15 Dr. Meldon in [facility]   PEA (Pulseless electrical activity) [facility])    ~ 01/13/21 at [facility] during admission DKA, volume overload, possible CAP, hypoxia s/p ACLS with ROSC ~ 10-15   Pneumonia    Primary insomnia    Rheumatoid arthritis (HCC)    Stroke (HCC)    Thyroid  disease    Type 1 diabetes mellitus (HCC)    Vitamin D  deficiency    Past Surgical History:  Procedure Laterality Date   ABDOMINAL HYSTERECTOMY     Partial- Still has both ovaries   CARDIOVASCULAR STRESS TEST  08/13/2014   Nuclear; Normal   CARPAL TUNNEL RELEASE     CATARACT EXTRACTION, BILATERAL     CESAREAN SECTION     CHOLECYSTECTOMY     CORONARY ANGIOPLASTY WITH STENT PLACEMENT     3 blockages/ 1 stent   CORONARY BALLOON ANGIOPLASTY N/A 07/18/2023   Procedure: CORONARY BALLOON ANGIOPLASTY;  Surgeon: Verlin Lonni BIRCH, MD;  Location: [facility] INVASIVE CV LAB;  Service: Cardiovascular;  Laterality: N/A;   CORONARY PRESSURE/FFR STUDY N/A 09/07/2022   Procedure: INTRAVASCULAR PRESSURE WIRE/FFR STUDY;  Surgeon: Jordan, Peter M, MD;  Location: [facility] INVASIVE CV LAB;  Service: Cardiovascular;  Laterality: N/A;    CORONARY STENT INTERVENTION N/A 09/29/2020   Procedure: CORONARY STENT INTERVENTION;  Surgeon: Wonda Sharper, MD;  Location: [facility] INVASIVE CV LAB;  Service: Cardiovascular;  Laterality: N/A;  CFX   CORONARY STENT INTERVENTION N/A 09/07/2022   Procedure: CORONARY STENT INTERVENTION;  Surgeon: Jordan, Peter M, MD;  Location: [facility] INVASIVE CV LAB;  Service: Cardiovascular;  Laterality: N/A;   CORONARY STENT INTERVENTION N/A 02/13/2023   Procedure: CORONARY STENT INTERVENTION;  Surgeon: Verlin Lonni BIRCH, MD;  Location: [facility] INVASIVE CV LAB;  Service: Cardiovascular;  Laterality: N/A;  LAD   CORONARY ULTRASOUND/IVUS N/A 02/13/2023   Procedure: Coronary Ultrasound/IVUS;  Surgeon: Verlin Lonni BIRCH, MD;  Location: [facility] INVASIVE CV LAB;  Service: Cardiovascular;  Laterality: N/A;   CORONARY/GRAFT ACUTE MI REVASCULARIZATION N/A 12/08/2022   Procedure: Coronary/Graft Acute MI Revascularization;  Surgeon: Anner Alm ORN, MD;  Location: [facility] INVASIVE CV LAB;  Service: Cardiovascular;  Laterality: N/A;   EYE SURGERY     LEFT HEART CATH AND CORONARY ANGIOGRAPHY N/A 09/29/2020   Procedure: LEFT HEART CATH AND CORONARY ANGIOGRAPHY;  Surgeon: Wonda Sharper, MD;  Location: [facility] INVASIVE CV LAB;  Service: Cardiovascular;  Laterality: N/A;   LEFT HEART CATH AND CORONARY ANGIOGRAPHY N/A 09/07/2022   Procedure: LEFT HEART CATH AND CORONARY ANGIOGRAPHY;  Surgeon: Jordan, Peter M, MD;  Location: [facility] INVASIVE CV LAB;  Service: Cardiovascular;  Laterality: N/A;   LEFT HEART CATH AND CORONARY ANGIOGRAPHY N/A 12/08/2022   Procedure: LEFT HEART CATH AND CORONARY ANGIOGRAPHY;  Surgeon: Anner Alm ORN, MD;  Location: [facility] INVASIVE CV LAB;  Service: Cardiovascular;  Laterality: N/A;   LEFT HEART CATH AND CORONARY ANGIOGRAPHY N/A 02/13/2023   Procedure: LEFT HEART CATH AND CORONARY ANGIOGRAPHY;  Surgeon: Verlin Lonni BIRCH, MD;  Location: [facility] INVASIVE CV LAB;  Service: Cardiovascular;  Laterality: N/A;   LEFT HEART CATH AND  CORONARY ANGIOGRAPHY N/A 07/16/2023   Procedure: LEFT HEART CATH AND CORONARY ANGIOGRAPHY;  Surgeon: Verlin Lonni BIRCH, MD;  Location: [facility] INVASIVE CV LAB;  Service: Cardiovascular;  Laterality: N/A;   RIGHT HEART CATH N/A 04/27/2021   Procedure: RIGHT HEART CATH;  Surgeon: Cherrie Toribio SAUNDERS, MD;  Location: [facility] INVASIVE CV LAB;  Service: Cardiovascular;  Laterality: N/A;   RIGHT/LEFT HEART CATH AND CORONARY ANGIOGRAPHY N/A 01/07/2017   Procedure: Right/Left Heart Cath and Coronary Angiography;  Surgeon: Rolan Ezra RAMAN, MD;  Location: [facility] INVASIVE CV LAB;  Service: Cardiovascular;  Laterality: N/A;   ROTATOR CUFF REPAIR     TEE WITHOUT CARDIOVERSION N/A 04/28/2021   Procedure: TRANSESOPHAGEAL ECHOCARDIOGRAM (TEE);  Surgeon: Cherrie Toribio SAUNDERS, MD;  Location: [facility] ENDOSCOPY;  Service: Cardiovascular;  Laterality: N/A;   TEE WITHOUT CARDIOVERSION N/A 05/18/2021   Procedure: TRANSESOPHAGEAL ECHOCARDIOGRAM (TEE);  Surgeon: Thukkani, Arun K, MD;  Location: [facility] INVASIVE CV LAB;  Service: Cardiovascular;  Laterality: N/A;   TOE AMPUTATION Left    TRANSCATHETER MITRAL EDGE TO EDGE REPAIR N/A 05/18/2021   Procedure: MITRAL VALVE REPAIR;  Surgeon: Wendel Lurena POUR, MD;  Location: [facility] INVASIVE CV LAB;  Service: Cardiovascular;  Laterality: N/A;   US  ECHOCARDIOGRAPHY  05/2017   Normal    Family History  Problem Relation Age of Onset   Breast cancer Mother 77       contralateral breast ca dx 29   Coronary artery disease Mother    Hypertension Mother    Hyperlipidemia Mother    Diabetes type II Mother    Lung cancer Mother 58   Diabetes Father    Hypertension Father    Mental illness Sister    Bipolar disorder Other    Depression Other    Schizophrenia Other    Cancer Other        MGF's sisters x2; unknown type   Social History   Socioeconomic History   Marital status: Married    Spouse name: [name]   Number of children: 2   Years of education: 16   Highest education level: Master's degree  (e.g., MA, MS, MEng, MEd, MSW, MBA)  Occupational History   Occupation: on disability/retired early former engineer, site  Tobacco Use   Smoking status: Former    Current packs/day: 0.00    Types: Cigarettes    Start date: 66    Quit date: 1984    Years since quitting: 41.0    Passive exposure: Past   Smokeless tobacco: Never  Vaping Use   Vaping status: Never Used  Substance and Sexual Activity   Alcohol use: Never   Drug use: Never   Sexual activity: Not on file  Other Topics Concern   Not on file  Social History Narrative   Not on file   Social Drivers of Health   Financial Resource Strain: Low Risk  (07/01/2023)   Overall Financial Resource Strain (CARDIA)    Difficulty of Paying Living Expenses: Not hard at all  Food Insecurity: No Food Insecurity (07/14/2023)   Hunger Vital Sign    Worried About Running Out of Food in the Last Year: Never true    Ran Out of Food in the Last Year: Never true  Transportation Needs: No Transportation Needs (07/14/2023)   PRAPARE - Administrator, Civil Service (Medical): No    Lack of Transportation (Non-Medical): No  Physical Activity: Insufficiently Active (07/01/2023)   Exercise Vital Sign  Days of Exercise per Week: 5 days    Minutes of Exercise per Session: 20 min  Stress: Stress Concern Present (07/01/2023)   [employer] of Occupational Health - Occupational Stress Questionnaire    Feeling of Stress : To some extent  Social Connections: Moderately Integrated (07/25/2022)   Social Connection and Isolation Panel [NHANES]    Frequency of Communication with Friends and Family: More than three times a week    Frequency of Social Gatherings with Friends and Family: More than three times a week    Attends Religious Services: More than 4 times per year    Active Member of [employer] or Organizations: No    Attends Engineer, Structural: Never    Marital Status: Married    Objective:  BP 130/62   Pulse 66    Temp 97.6 F (36.4 C)   Ht 5' 4.5 (1.638 m)   Wt 226 lb 12.8 oz (102.9 kg)   SpO2 97%   BMI 38.33 kg/m      07/29/2023   10:47 AM 07/20/2023    2:18 PM 07/20/2023    1:44 PM  BP/Weight  Systolic BP 130 151 143  Diastolic BP 62 53 57  Wt. (Lbs) 226.8    BMI 38.33 kg/m2      Physical Exam Vitals and nursing note reviewed.  Constitutional:      Appearance: She is obese.  HENT:     Head: Normocephalic and atraumatic.  Cardiovascular:     Rate and Rhythm: Normal rate and regular rhythm.  Pulmonary:     Effort: Pulmonary effort is normal.     Breath sounds: Normal breath sounds.  Musculoskeletal:        General: No swelling. Normal range of motion.     Cervical back: Normal range of motion.  Skin:    General: Skin is warm.  Neurological:     General: No focal deficit present.     Mental Status: She is alert.  Psychiatric:        Mood and Affect: Mood normal.     Diabetic Foot Exam - Simple   No data filed      Lab Results  Component Value Date   WBC 9.0 07/20/2023   HGB 8.5 (L) 07/20/2023   HCT 26.5 (L) 07/20/2023   PLT 223 07/20/2023   GLUCOSE 103 (H) 07/20/2023   CHOL 92 (L) 07/12/2023   TRIG 81 07/12/2023   HDL 47 07/12/2023   LDLCALC 28 07/12/2023   ALT 25 07/12/2023   AST 21 07/12/2023   NA 139 07/20/2023   K 4.2 07/20/2023   CL 105 07/20/2023   CREATININE 2.61 (H) 07/20/2023   BUN 64 (H) 07/20/2023   CO2 25 07/20/2023   TSH 2.900 01/19/2022   INR 1.1 05/16/2021   HGBA1C 9.0 (H) 05/14/2023      Assessment & Plan:    CKD stage 4 due to type 1 diabetes mellitus (HCC) Assessment & Plan: CKD stage 4 with recent acute kidney injury. Discharge creatinine was 2.61, and the most recent eGFR was 20. Increased echogenicity of renal parenchyma noted on ultrasound, but no mass or hydronephrosis. Follows up with nephrologist. - Order kidney function lab (CBC and CMP) and send results to nephrologist. - Continue monitoring kidney function and follow up  with nephrologist.   Orders: -     Comprehensive metabolic panel  Anemia in stage 4 chronic kidney disease (HCC) Assessment & Plan: Anemia during hospitalization with hemoglobin improving from  7.9 to 9.3 at discharge. Continued monitoring necessary. - Monitor hemoglobin and hematocrit levels with CBC.   Orders: -     CBC  Hospital discharge follow-up Assessment & Plan: Admitted from December 28 till July 20, 2023 for anginal symptoms in the setting of CAD, status post balloon angioplasty of LAD where she had in-stent restenosis. No medication changes.  Type 1 diabetes controlled with complications of CKD 4 with recent hypoglycemic episode during hospitalization. Blood sugars currently well-controlled. - Continue current diabetes management plan. - Monitor blood glucose levels closely.  General Health Maintenance Inquired about vaccinations due to upcoming grandchild. Last pneumococcal vaccine in 2015. - Administer PCV20 pneumococcal vaccine. - Recommend tetanus shot and shingles vaccine at the pharmacy due to her insurance   Follow-up - Schedule follow-up appointment with nephrologist after kidney function lab results. - Follow up with cardiologist regarding Imdur  and overall cardiac management.   Coronary artery disease of native artery of native heart with stable angina pectoris [facility]) Assessment & Plan: Experienced angina and underwent two cardiac catheterizations between December 28 and January 4. Severe restenosis of the proximal LAD stent identified. Staged intervention performed due to CKD, with successful scoring balloon angioplasty on January 2. No new stents placed. No current chest pain or exertional chest pain, and blood pressure is well-controlled. Informed consent included discussion of risks related to kidney function and the need for staged intervention. - Follow up with cardiologist to discuss resuming Imdur , considering previous discontinuation due to  hypotension. - Monitor for recurrence of chest pain or angina symptoms.  She mentions that she was told by the cardiologist in the hospital that she probably should be on Imdur .  Patient thought she has been on Imdur  all along, but on reviewing her medication history, looks like she has not been on it for few months.  In her cardiologist note from April 16, 2023, there was mention of Imdur  being discontinued due to low blood pressures.  Advised that she is probably okay to stay off of Imdur  at this time as her angina has resolved after her balloon angioplasty during her recent hospitalization and her blood pressure is on the normal side.  She has an appointment with her cardiologist next week, she could discuss this with him at that time   Need for vaccination -     Pneumococcal conjugate vaccine 20-valent     No orders of the defined types were placed in this encounter.   Orders Placed This Encounter  Procedures   Pneumococcal conjugate vaccine 20-valent (Prevnar 20)   CBC   Comprehensive metabolic panel     Follow-up: Return if symptoms worsen or fail to improve.    An After Visit Summary was printed and given to the patient.  Total time spent on today's visit was 57 minutes, including both face-to-face time and nonface-to-face time personally spent on review of chart (labs and imaging), discussing labs and goals, discussing further work-up, treatment options, referrals to specialist if needed, reviewing outside records of pertinent, answering patient's questions, and coordinating care.   Ryker Pherigo, MD [facility] [phone]

## 2023-07-29 NOTE — Assessment & Plan Note (Signed)
 Anemia during hospitalization with hemoglobin improving from 7.9 to 9.3 at discharge. Continued monitoring necessary. - Monitor hemoglobin and hematocrit levels with CBC.

## 2023-07-29 NOTE — Patient Instructions (Signed)
 During your visit, we reviewed your recent hospitalization for angina and the procedures you underwent. We discussed your coronary artery disease, chronic kidney disease, congestive heart failure, anemia, and diabetes management. We also addressed your need for updated vaccinations.  YOUR PLAN:  -CORONARY ARTERY DISEASE WITH RESTENOSIS OF LAD STENT: Coronary artery disease is a condition where the heart's blood vessels are narrowed or blocked. You had severe restenosis (re-narrowing) of a stent in your heart, which was treated with a balloon angioplasty. We will follow up with your cardiologist to discuss resuming Imdur , a medication for chest pain, and monitor for any recurrence of symptoms.  -CHRONIC KIDNEY DISEASE STAGE 4: Chronic kidney disease is a long-term condition where the kidneys do not work as well as they should. Your kidney function has improved slightly, and we will continue to monitor it with lab tests and follow up with your nephrologist.  -CONGESTIVE HEART FAILURE: Congestive heart failure is a condition where the heart does not pump blood as well as it should, leading to fluid buildup. Your symptoms have improved with medication, and we will continue to monitor for any signs of fluid overload.  -ANEMIA: Anemia is a condition where you do not have enough red blood cells. Your hemoglobin levels have improved, and we will keep monitoring them with blood tests.  -TYPE 1 DIABETES MELLITUS: Type 1 diabetes is a condition where your body does not produce insulin , leading to high blood sugar levels. Your blood sugars are currently well-controlled, and we will continue your current management plan and closely monitor your levels.  -GENERAL HEALTH MAINTENANCE: We discussed updating your vaccinations, especially since you are expecting a new grandchild. You will receive the PCV15 pneumococcal vaccine today, and we recommend getting a tetanus shot at the pharmacy. We also discussed other  vaccines such as COVID, flu, RSV, and shingles.  INSTRUCTIONS:  Please schedule a follow-up appointment with your nephrologist after your kidney function lab results are available. Additionally, follow up with your cardiologist regarding the use of Imdur  and your overall cardiac management.

## 2023-07-29 NOTE — Assessment & Plan Note (Signed)
 CKD stage 4 with recent acute kidney injury. Discharge creatinine was 2.61, and the most recent eGFR was 20. Increased echogenicity of renal parenchyma noted on ultrasound, but no mass or hydronephrosis. Follows up with nephrologist. - Order kidney function lab (CBC and CMP) and send results to nephrologist. - Continue monitoring kidney function and follow up with nephrologist.

## 2023-07-29 NOTE — Progress Notes (Signed)
ADVANCED HF CLINIC NOTE   PCP: Blane Ohara, MD HF Cardiologist: Arvilla Meres, MD   Reason for Visit: f/u chronic diastolic HF  HPI: Ms Knuteson is 63 y.o. female with history of diastolic heart failure s/p cardiomems 02/2018, CAD, s/p DES RCA and LAD 2018, DMI, htn, hyperlipidemia, PAD, and OSA on CPAP, PAF s/p ablation, PAD s/p L great toe and 4th toe amputation, stage IIIb CKD.   Admitted 03/22 with NSTEMI and a/c CHF. LHC 03/22 showed moderate mid-LAD and mid-RCA disease, severe mid-L-Cx stenosis s/p PCI/DES mid Cx and side branch angioplasty of OM1.   Admitted 6/22 to [facility] for DKA, started on insulin gtt, IVF and diuretics held. Developed pulmonary vascular congestion vs PNA and was started on IV abx, and diuretics were resumed. She suffered a PEA arrest 01/12/21 with ROSC after 10-15 minutes of CPR and was transferred to [facility]; precipitating factor for PEA arrest thought to be respiratory driven. On arrival, she was still in DKA, AKI on CKD with oliguria and Nephrology was consulted for further management. TTE with EF 60%, min MR/TR. Concern for stroke due to left-sided weakness and MRI confirmed anoxic brain injury, but no definite infarct. She was extubated, SCr returned to baseline of 1.6-1.8 and she was discharged to rehab facility.   Zio placed 8/22 for palpitations, showing mostly SB/SR no afib, 66 SVT runs, frequent isolated SVE (11.2%).  Admitted from AHF clinic 10/22 for A/C CHF after failing outpatient escalation in diuretic therapy. She was enrolled in FASTR trial and diuresed > 15 lb.  Hospitalization complicated by AKI on CKD, Scr up to 3.13. RHC showed mild to moderate elevating filling pressures with large v-waves in PCWP tracing suggesting severe MR vs severe diastolic dysfunction. TEE 55-60% showed severe central MR.   Patient underwent TEER of mitral valve with placement of 1XT W and 1NT W clip on 05/18/2021. NT W clip was entangled and had to be deployed into  lateral part of valve. No additional clip placement could be preformed. Echo 11/04 with EF 50-55% and residual moderate MR. Given 1 dose IV lasix prior to discharge.  Echo 3/23 showed EF 55-60%, RV OK, moderate MR mean gradient 7-8 mmHg. Saw Dr. Lynnette Caffey and MR now moderate, no indication for 3rd clip.   Admitted 2/24 with CP. LHC showed 2 vessel obstructive CAD, with new 90% stenosis pLAD with RFR 0.48. The stent in the LCx is occluded after a large first OM; Patent stent in the distal RCA; Mildly elevated LVEDP 21 mm Hg; Successful PCI of the proximal LAD with DES x 1 overlapping with prior stent. Lcx occlusion to be treated medically. Elevated LVEDP on cath and given IV lasix and discharged home, weight 231 lbs.  Admitted 5/24 with NSTEMI. LHC 12/08/22 with DES to LAD, also with 85% ostial proximal RCA stenosis. LVEDP 23. She was given 60 cc contrast and IVF post cath, continued to have worsening renal function with SCr up to 4.25. Echo showed EF 50-55%, RV mildly reduced. Post mitral clip x 2 with XTW and NTW the latter entangled in chords no obvious SLD noted mean gradient 5 mmHg. Mild residual MR appears medial to the two clips. AHF and nephrology consulted. No indication for HD, diuresed with IV lasix eventually transitioned or torsemide 80 mg daily. She was discharged home, weight 224 lbs.  Admitted 8/24 with CP. Underwent LHC showing severe restenosis in the proximal LAD stent, s/p successful IVUS guided PTCA/DES x 1 proximal LAD, chronic occlusion mid Circumflex  beyond the stented segment. Patent obtuse marginal branch with severe distal stenosis (too small for PCI), dominant RCA with moderate proximal stenosis, patent distal stent, LVEDP 19 mmhg. Renal function worsened with dye load from PCI, SCr 1.9>>3.35. Nephrology consulted, and underwent TDC placement, however renal function improved and she did not require dialysis. GDMT remained, but Imdur stopped with soft BP. Discharged home, weight 224  lbs.  Admitted in the end of December with angina. LHC 12/24 showed proximal LAD restenosis. Successful balloon angioplasty performed on 1/2. Also with klebsiella UTI, treated with ceftriaxone>Duricef. Discharged on brillinta/ASA.  Today she returns for post hospital follow up with her daughter and husband. Overall feeling ok just concerned about her stent re-occluding. Denies palpitations, CP, dizziness, or PND/Orthopnea.  SOB with activity, able to do ADLs around the house. Gets swelling in her legs daily, worsens as the day progresses, improves with elevation. Wears ted hose daily. Appetite ok. No fever or chills. Weight at home 228 pounds. Taking all medications. Denies ETOH, tobacco or drug use. Drinking ~ 54 oz fluid daily.   Cardiac Studies: - Echo 12/24: EF 55-60%, RV normal, MVg unchanged at 5 mmHg - LHC 12/24: multivessel CAD. Proximal LAD restenosis - LHC (8/24): IVUS guided PTCA/DES x 1 proximal LAD, chronic occlusion mid Circ beyond the stented segment, patent obtuse marginal branch with severe distal stenosis (too small for PCI), dominant RCA with moderate proximal stenosis, patent distal stent, LVEDP 19 mmhg. - Echo (5/24): EF 50-55%. RV mildly reduced, post mitral clip x 2 with XTW and NTW the latter entangled in chords, no obvious SLD noted, mean gradient 5 mmHg, mild residual MR appears medial to the two clips. - LHC (5/24): DES to LAD, osial prox RCA with severe 85% stenosis-->will need staged intervention when renal function stabilized. - LHC (2/24): 2v obstructive CAD. New 90% stenosis pLAD with RFR 0.48. The stent in the LCx is occluded after a large first OM. Patent stent in the distal RCA. S/p PCI of pLAD with DES x 1 overlapping with prior stent. - Echo (1/24): EF 55-60%, normal RV and moderate MR, mG 7.5 mmHg, essentially unchanged from prior echo.   Review of systems complete and found to be negative unless listed in HPI.    Past Medical History:  Diagnosis Date    Acquired absence of left great toe (HCC)    Acquired absence of other left toe(s) (HCC)    ACS (acute coronary syndrome) (HCC)    Anemia    Anoxic brain injury (HCC)    Anxiety    CAD (coronary artery disease)    DES mLAD 04/07/15; DES dRCA & DES pPDA 08/30/16; DES mCX & PTCA OM1 09/29/20   Chronic diastolic (congestive) heart failure (HCC)    Chronic pain    Chronic systolic (congestive) heart failure (HCC)    CKD (chronic kidney disease)    Contrast dye induced nephropathy, possible 09/03/2020   COPD (chronic obstructive pulmonary disease) (HCC)    COVID-19 virus infection 03/03/2023   Diabetes (HCC)type 1    Diastolic CHF (HCC)    Dyspnea    Family history of breast cancer 10/23/2021   Gastro-esophageal reflux disease without esophagitis    Hyperlipidemia    Hypertension    Hypertensive heart disease with heart failure (HCC)    Hypoxemia    Idiopathic gout, multiple sites    Leukocytosis 03/29/2022   Localized edema    Low back pain    Mitral regurgitation    Mitral regurgitation  Mixed hyperlipidemia    Mixed incontinence    Morbid (severe) obesity due to excess calories (HCC)    Neuromuscular disorder (HCC)    Neuropathy    Non-ST elevation (NSTEMI) myocardial infarction (HCC)    08/29/20, 09/29/20   OSA (obstructive sleep apnea) 09/03/2020   Other specified hypothyroidism    PAF (paroxysmal atrial fibrillation) (HCC)    s/p pulmonary vein isolation by cryoablation 10/04/15 Dr. Rudolpho Sevin in [facility]   PEA (Pulseless electrical activity) [facility])    ~ 01/13/21 at [facility] during admission DKA, volume overload, possible CAP, hypoxia s/p ACLS with ROSC ~ 10-15   Pneumonia    Primary insomnia    Rheumatoid arthritis (HCC)    Stroke (HCC)    Thyroid disease    Type 1 diabetes mellitus (HCC)    Vitamin D deficiency    Current Outpatient Medications  Medication Sig Dispense Refill   allopurinol (ZYLOPRIM) 100 MG tablet Take 0.5 tablets (50 mg total) by mouth daily. 90  tablet 1   aspirin EC 81 MG tablet Take 1 tablet (81 mg total) by mouth daily. Swallow whole. 30 tablet 12   busPIRone (BUSPAR) 10 MG tablet Take 10 mg by mouth 3 (three) times daily.     carvedilol (COREG) 12.5 MG tablet Take 1 tablet (12.5 mg total) by mouth 2 (two) times daily. 180 tablet 1   Continuous Blood Gluc Sensor (FREESTYLE LIBRE 2 SENSOR) MISC Inject 1 Device into the skin every 14 (fourteen) days.     CRANBERRY PO Take 2 tablets by mouth daily.     EPINEPHrine 0.3 mg/0.3 mL IJ SOAJ injection Use as directed for life-threatening allergic reaction. 4 each 3   Evolocumab (REPATHA SURECLICK) 140 MG/ML SOAJ Inject 140 mg into the skin every 14 (fourteen) days. 6 mL 1   ezetimibe (ZETIA) 10 MG tablet Take 1 tablet (10 mg total) by mouth every evening. 90 tablet 1   gabapentin (NEURONTIN) 100 MG capsule Take 1 capsule (100 mg total) by mouth 2 (two) times daily. 180 capsule 0   Glucagon (GVOKE HYPOPEN 2-PACK) 0.5 MG/0.1ML SOAJ Inject 0.5 mg into the skin daily as needed. 0.2 mL 5   HYDROcodone-acetaminophen (NORCO) 7.5-325 MG tablet Take 1 tablet by mouth 3 (three) times daily as needed. 90 tablet 0   insulin aspart (NOVOLOG FLEXPEN) 100 UNIT/ML FlexPen Inject 2 - 10 Units before meals based on SSI. (Patient taking differently: Inject 2-10 Units into the skin See admin instructions. Inject 2-10 units into the skin before meals, based on SSI) 15 mL 3   insulin glargine (LANTUS SOLOSTAR) 100 UNIT/ML Solostar Pen Inject 40 Units into the skin daily.     Insulin Pen Needle (BD PEN NEEDLE NANO 2ND GEN) 32G X 4 MM MISC Inject 1 each into the skin in the morning, at noon, in the evening, and at bedtime. 600 each 3   latanoprost (XALATAN) 0.005 % ophthalmic solution Place 1 drop into both eyes at bedtime.     linaclotide (LINZESS) 145 MCG CAPS capsule Take 1 capsule (145 mcg total) by mouth daily before breakfast. (Patient taking differently: Take 145 mcg by mouth daily as needed (for constipation).)  90 capsule 1   LORazepam (ATIVAN) 0.5 MG tablet Take 1 tablet (0.5 mg total) by mouth daily as needed for anxiety. 90 tablet 1   magnesium oxide (MAG-OX) 400 MG tablet Take 2 tablets (800 mg total) by mouth daily. Take 2 (400 mg) daily=800 mg (Patient taking differently: Take 800  mg by mouth daily.) 180 tablet 1   nitroGLYCERIN (NITROSTAT) 0.4 MG SL tablet DISSOLVE ONE TABLET UNDER THE TONGUE EVERY 5 MINUTES AS NEEDED FOR CHEST PAIN.  DO NOT EXCEED A TOTAL OF 3 DOSES IN 15 MINUTES (Patient taking differently: DISSOLVE ONE TABLET UNDER THE TONGUE EVERY 5 MINUTES AS NEEDED FOR CHEST PAIN.  DO NOT EXCEED A TOTAL OF 3 DOSES IN 15 MINUTES) 50 tablet 3   Omega-3 Fatty Acids (FISH OIL PO) Take 1 capsule by mouth daily.     OXYGEN Inhale 2 L/min into the lungs at bedtime.     pantoprazole (PROTONIX) 40 MG tablet Take 1 tablet (40 mg total) by mouth daily. (Patient taking differently: Take 40 mg by mouth daily before breakfast.) 90 tablet 1   potassium chloride SA (KLOR-CON M) 20 MEQ tablet Take 2 tablets (40 mEq total) by mouth daily. (Patient taking differently: Take 20 mEq by mouth See admin instructions. Take 20 mEq by mouth on Mondays  and Thursdays ONLY) 180 tablet 1   PRESCRIPTION MEDICATION See admin instructions. CPAP- At bedtime     Probiotic CAPS Take 1 capsule by mouth daily.     promethazine (PHENERGAN) 25 MG tablet Take 1 tablet (25 mg total) by mouth daily as needed. 90 tablet 1   ticagrelor (BRILINTA) 90 MG TABS tablet Take 1 tablet (90 mg total) by mouth 2 (two) times daily. 180 tablet 1   torsemide (DEMADEX) 20 MG tablet Take 3 tablets (60 mg total) by mouth 2 (two) times daily for 3 days, THEN 3 tablets (60 mg total) daily. (Patient taking differently:  3 tablets (60 mg total) daily.) 108 tablet 0   No current facility-administered medications for this encounter.   Allergies  Allergen Reactions   Naproxen Hives and Rash   Shellfish-Derived Products Hives, Swelling and Other (See  Comments)    Angioedema    Strawberry (Diagnostic) Hives   Celecoxib Other (See Comments)    Stomach pain   Glucosamine Hives, Swelling and Other (See Comments)    Angioedema    Iron Hives and Other (See Comments)    IV iron transfusion - hives - Feb 2022 hospitalization  Patient said she CAN tolerate if given Benadryl first   Metoclopramide Other (See Comments)    Facial twitching and stuttering   Metolazone Other (See Comments)    Acute renal failure   Statins Other (See Comments)    Muscle pain   Social History   Socioeconomic History   Marital status: Married    Spouse name: [name]   Number of children: 2   Years of education: 16   Highest education level: Master's degree (e.g., MA, MS, MEng, MEd, MSW, MBA)  Occupational History   Occupation: on disability/retired early former Engineer, site  Tobacco Use   Smoking status: Former    Current packs/day: 0.00    Types: Cigarettes    Start date: 48    Quit date: 1984    Years since quitting: 41.0    Passive exposure: Past   Smokeless tobacco: Never  Vaping Use   Vaping status: Never Used  Substance and Sexual Activity   Alcohol use: Never   Drug use: Never   Sexual activity: Not on file  Other Topics Concern   Not on file  Social History Narrative   Not on file   Social Drivers of Health   Financial Resource Strain: Low Risk  (07/01/2023)   Overall Financial Resource Strain (CARDIA)    Difficulty  of Paying Living Expenses: Not hard at all  Food Insecurity: No Food Insecurity (07/14/2023)   Hunger Vital Sign    Worried About Running Out of Food in the Last Year: Never true    Ran Out of Food in the Last Year: Never true  Transportation Needs: No Transportation Needs (07/14/2023)   PRAPARE - Administrator, Civil Service (Medical): No    Lack of Transportation (Non-Medical): No  Physical Activity: Insufficiently Active (07/01/2023)   Exercise Vital Sign    Days of Exercise per Week: 5 days     Minutes of Exercise per Session: 20 min  Stress: Stress Concern Present (07/01/2023)   [employer] of Occupational Health - Occupational Stress Questionnaire    Feeling of Stress : To some extent  Social Connections: Moderately Integrated (07/25/2022)   Social Connection and Isolation Panel [NHANES]    Frequency of Communication with Friends and Family: More than three times a week    Frequency of Social Gatherings with Friends and Family: More than three times a week    Attends Religious Services: More than 4 times per year    Active Member of [employer] or Organizations: No    Attends Banker Meetings: Never    Marital Status: Married  Catering manager Violence: Not At Risk (07/14/2023)   Humiliation, Afraid, Rape, and Kick questionnaire    Fear of Current or Ex-Partner: No    Emotionally Abused: No    Physically Abused: No    Sexually Abused: No   Family History  Problem Relation Age of Onset   Breast cancer Mother 53       contralateral breast ca dx 80   Coronary artery disease Mother    Hypertension Mother    Hyperlipidemia Mother    Diabetes type II Mother    Lung cancer Mother 44   Diabetes Father    Hypertension Father    Mental illness Sister    Bipolar disorder Other    Depression Other    Schizophrenia Other    Cancer Other        MGF's sisters x2; unknown type   BP 102/64   Pulse 61   Wt 103.3 kg (227 lb 12.8 oz)   SpO2 99%   BMI 38.50 kg/m   Wt Readings from Last 3 Encounters:  08/05/23 103.3 kg (227 lb 12.8 oz)  07/29/23 102.9 kg (226 lb 12.8 oz)  07/18/23 107.2 kg (236 lb 5.3 oz)   PHYSICAL EXAM on video visit: General:  Well appearing.  No respiratory difficulty. Arrived in [facility] HEENT: normal Neck: supple. JVD ~7 cm. Carotids 2+ bilat; no bruits. No lymphadenopathy or thyromegaly appreciated. Cor: PMI nondisplaced. Regular rate & rhythm. No rubs, gallops or murmurs. Lungs: clear, diminished bases Abdomen: soft, nontender, nondistended.  No hepatosplenomegaly. No bruits or masses. Good bowel sounds. Extremities: no cyanosis, clubbing, rash, non-pitting BLE edema. + TED hose Neuro: alert & oriented x 3, cranial nerves grossly intact. moves all 4 extremities w/o difficulty. Affect pleasant.   CardioMEMs: 10 08/02/23. Goal 10   EKG SB 57 bpm (Personally reviewed)    ASSESSMENT & PLAN: 1. CAD - H/o multiple PCI with stents to mid LAD and pRCA.  - NSTEMI (2/22). Cath deferred due to AKI. Treated medically.  - NSTEMI (7/22)-->S/p PCI/DES to mid Cx, balloon angioplasty to OM1  - LHC (2/24) for chest pain--> new 90% stenosis pLAD. Successful PCI of pLAD with DES x 1 overlapping with prior stent.  -  LHC (5/24) --> DES to LAD, severe 85% ostial prox RCA will need eventual intervention but renal function precludes.  - LHC (8/24): s/p IVUS guided PTCA/DES x 1 proximal LAD - LHC 12/24: with proximal LAD restenosis. S/p successful balloon angioplasty 07/18/23 - Doing well. No s/s angina  - Continue ASA + Brilinta. - Continue Coreg. - Continue Repatha + Zetia (allergy statins). Recent LDL 28 12/24 - Restart Imdur at 30 mg daily. Previously stopped with hypotension  2. Chronic Diastolic Heart Failure.  - Echo 08/2020 EF 55-60%  - Echo (3/22): EF 55-60%, Grade II DD, normal RV - Echo (6/22): EF 60%, mod MR/TR (per chart review at [facility]). - Admit 10/22 with a/c CHF, enrolled in FASTR trial and diuresed 15 lbs. Severe MR likely contributing to decompensation.  - RHC (10/22): mild to moderate elevated filling pressures, large v-waves PCWP tracing - TEE (10/22): EF 55-60%, basal inferior akinesis, severe central MR.>>Underwent TEER>>mod residual MR - Echo (11/22): EF 50-55%, severe LV HK, moderate MR - Echo (3/23): EF 55-60%, RV OK, moderate MR mean gradient 7-8 mmHg - Echo (1/24): EF 55-60%, RV normal, mod MR mG 7.5 mmHg  - Echo (5/24): EF 55-60%, mild LVH, normal RV, mild MR - Echo (12/24): EF 55-60%, LV with no RWMA, RV normal, mild MR  mG 5 mmHg - Improved NYHA II-IIIa. Volume status ok  - Continue torsemide 60 mg daily + 20 KCL daily. - Decrease carvedilol 12.5>9.375 mg bid.  3. CKD IV - Prior h/o CIN following LHC - Previous SCr baseline 1.6-1.8 with multiple AKIs over the last year. New baseline ~ 2.0-2.5 - Followed by Nephrology in [facility], planning on switching to [facility] - Furoscix to use PRN and limit metolazone use - AKI post cath (5/24), 60 cc contrast used. SCr 2.7-->4.25 - AKI post cath (8/24), 80 cc contrast used. SCr peak at 5.19>> 2.18 - BMET on 07/29/23 Scr 2.23 (Personally reviewed)    4. MR  - Severe central MR on TEE on 10/22.   - S/p TEER 05/18/2021 - two clips placed, one became entangled and had to be deployed. - Moderate residual MR on echo 11/22. - Echo (1/24) with stable moderate MR, mG 7.5 mmHg  - Echo (5/24): post mitral clip x 2 with XTW and NTW the latter entangled in chords no obvious SLD noted mean gradient 5 mmHg. Mild residual MR appears medial to the two clips  - Echo 12/24: MVg unchanged at 5 mmHg   5. OSA - Continue nightly CPAP - No change   6. DM I - On insulin - Recent A1c 9 10/24 - No SGLT2i with recurrent UTIs   Alen Bleacher, NP  10:18 AM

## 2023-07-30 LAB — CBC
Hematocrit: 32.6 % — ABNORMAL LOW (ref 34.0–46.6)
Hemoglobin: 10 g/dL — ABNORMAL LOW (ref 11.1–15.9)
MCH: 27.3 pg (ref 26.6–33.0)
MCHC: 30.7 g/dL — ABNORMAL LOW (ref 31.5–35.7)
MCV: 89 fL (ref 79–97)
Platelets: 309 10*3/uL (ref 150–450)
RBC: 3.66 x10E6/uL — ABNORMAL LOW (ref 3.77–5.28)
RDW: 14.5 % (ref 11.7–15.4)
WBC: 9.3 10*3/uL (ref 3.4–10.8)

## 2023-07-30 LAB — COMPREHENSIVE METABOLIC PANEL
ALT: 20 [IU]/L (ref 0–32)
AST: 16 [IU]/L (ref 0–40)
Albumin: 4 g/dL (ref 3.8–4.9)
Alkaline Phosphatase: 176 [IU]/L — ABNORMAL HIGH (ref 44–121)
BUN/Creatinine Ratio: 26 (ref 12–28)
BUN: 57 mg/dL — ABNORMAL HIGH (ref 8–27)
Bilirubin Total: 0.5 mg/dL (ref 0.0–1.2)
CO2: 25 mmol/L (ref 20–29)
Calcium: 8.9 mg/dL (ref 8.7–10.3)
Chloride: 101 mmol/L (ref 96–106)
Creatinine, Ser: 2.23 mg/dL — ABNORMAL HIGH (ref 0.57–1.00)
Globulin, Total: 3.4 g/dL (ref 1.5–4.5)
Sodium: 143 mmol/L (ref 134–144)
Total Protein: 7.4 g/dL (ref 6.0–8.5)
eGFR: 25 mL/min/{1.73_m2} — ABNORMAL LOW (ref >59)

## 2023-07-31 ENCOUNTER — Other Ambulatory Visit (HOSPITAL_COMMUNITY): Payer: Self-pay

## 2023-08-01 ENCOUNTER — Telehealth: Payer: Self-pay | Admitting: *Deleted

## 2023-08-01 ENCOUNTER — Other Ambulatory Visit: Payer: Self-pay | Admitting: *Deleted

## 2023-08-01 DIAGNOSIS — G4733 Obstructive sleep apnea (adult) (pediatric): Secondary | ICD-10-CM | POA: Diagnosis not present

## 2023-08-01 NOTE — Patient Outreach (Signed)
Care Management   Visit Note  08/01/2023 Name: Dana [name] MRN: [id number] DOB: 1961/07/04  Subjective: Dana [name] is a 63 y.o. year old female who is a primary care patient of Cox, Kirsten, MD. The Care Management team was consulted for assistance.      Engaged with patient spoke with patient by telephone.    Goals Addressed             This Visit's Progress    RNCM Care Management Expected Outcome:  Monitor, Self-Manage and Reduce Symptoms of Diabetes       Current Barriers:  Chronic Disease Management support and education needs related to effective management of DM Lab Results  Component Value Date   HGBA1C 9.0 (H) 05/14/2023    Planned Interventions: Provided education to patient about basic DM disease process. Fasting glucose 92 this morning, highest: 193 lowest: 63. Patient knowledgeable about hypoglycemic education. She states her Lantus has been decreased to 37 units.  Reviewed medications with patient and discussed importance of medication adherence. Patient states compliance with medications.  Reviewed prescribed diet with patient heart healthy/ADA diet. Is compliant of heart healthy/ADA diet.  Counseled on importance of regular laboratory monitoring as prescribed. Has regular lab work done.  Discussed plans with patient for ongoing care management follow up and provided patient with direct contact information for care management team;      Provided patient with written educational materials related to hypo and hyperglycemia and importance of correct treatment.      Reviewed scheduled/upcoming provider appointments including:  Advised patient, providing education and rationale, to check cbg twice daily and when you have symptoms of low or high blood sugar and record. Has a continuous reader.       call provider for findings outside established parameters;       Referral made to community resources care guide team for assistance with food insecurities and  resources in her area to help with expressed needs. Has talked with care guides and had resources mailed.  ;      Review of patient status, including review of consultants reports, relevant laboratory and other test results, and medications completed;       Advised patient to discuss changes in DM, questions, and concerns with provider;      Screening for signs and symptoms of depression related to chronic disease state;        Assessed social determinant of health barriers;         Symptom Management: Take medications as prescribed   Attend all scheduled provider appointments Call provider office for new concerns or questions  call the Suicide and Crisis Lifeline: 988 call the [place] National Suicide Prevention Lifeline: (909)050-8778 or TTY: 407 227 5360 TTY 2191415871) to talk to a trained counselor call 1-800-273-TALK (toll free, 24 hour hotline) if experiencing a Mental Health or Behavioral Health Crisis  keep appointment with eye doctor check feet daily for cuts, sores or redness trim toenails straight across manage portion size wash and dry feet carefully every day wear comfortable, cotton socks wear comfortable, well-fitting shoes  Follow Up Plan: Telephone follow up appointment with care management team member scheduled for: 09-04-2023 at 345 pm       RNCM Care Management Expected Outcome:  Monitor, Self-Manage and Reduce Symptoms of: Depression, Anxiety, Stress       Current Barriers:  Knowledge Deficits related to resources available to help with expressed needs at this time due to her husband losing his job and his  unemployment has expired Care Coordination needs related to resources to help meet financial needs at this time in a patient with stress, anxiety, and depression  Chronic Disease Management support and education needs related to effective management of stress, anxiety, and depression Financial Constraints.   Planned Interventions: Evaluation of current  treatment plan related to depression, stress, and anxiety and patient's adherence to plan as established by provider. The patient states she is doing well and speaks with her counselor every 2 weeks or more if needed. Reviewed medications with patient and discussed compliance. The patient is compliant with medications.  Provided patient with care guide and SW support as well as support from the interdisciplinary team for management of stress, anxiety, and depression  educational materials related to effective management of stress, anxiety, and depression. Continues to decline LCSW referral at this time. Reviewed scheduled/upcoming provider appointments including  Care Guide referral for community resources for [facility] for food insecurities and assistance with utilities. Has information from the care guides  Social Work referral for education and support and resources for effective management of stress, anxiety, and depression.  Discussed plans with patient for ongoing care management follow up and provided patient with direct contact information for care management team Advised patient to discuss changes in mood, increased anxiety, depression, stress, or other mental health needs with provider Screening for signs and symptoms of depression related to chronic disease state  Assessed social determinant of health barriers  Symptom Management: Take medications as prescribed   Attend all scheduled provider appointments Call provider office for new concerns or questions  call the Suicide and Crisis Lifeline: 988 call the [place] National Suicide Prevention Lifeline: (878)060-8630 or TTY: 276-066-6876 TTY 202 163 3928) to talk to a trained counselor call 1-800-273-TALK (toll free, 24 hour hotline) if experiencing a Mental Health or Behavioral Health Crisis   Follow Up Plan: Telephone follow up appointment with care management team member scheduled for: 09-04-2023 at 345 pm       RNCM Care  Management Expected Outcome:  Monitor, Self-Manage, and Reduce Symptoms of Hypertension       Current Barriers:  Chronic Disease Management support and education needs related to effective management of HTN BP Readings from Last 3 Encounters:  07/29/23 130/62  07/20/23 (!) 151/53  07/12/23 (!) 104/58   Wt Readings from Last 3 Encounters:  07/29/23 226 lb 12.8 oz (102.9 kg)  07/18/23 236 lb 5.3 oz (107.2 kg)  07/12/23 232 lb (105.2 kg)    Planned Interventions: Evaluation of current treatment plan related to hypertension self management and patient's adherence to plan as established by provider. Regularly checks blood pressure. Provided education to patient re: stroke prevention, s/s of heart attack and stroke.  Reviewed prescribed diet heart healthy/ADA diet.  Reviewed medications with patient and discussed importance of compliance. Reports compliance with all medications. Discussed plans with patient for ongoing care management follow up and provided patient with direct contact information for care management team; Advised patient, providing education and rationale, to monitor blood pressure daily and record, calling PCP for findings outside established parameters.  Reviewed scheduled/upcoming provider appointments including:  Advised patient to discuss changes in her blood pressures or heart health  with provider; Provided education on prescribed diet heart healthy/ADA diet.  Discussed complications of poorly controlled blood pressure such as heart disease, stroke, circulatory complications, vision complications, kidney impairment, sexual dysfunction;  Screening for signs and symptoms of depression related to chronic disease state;  Assessed social determinant of health barriers;  Symptom Management: Take medications as prescribed   Attend all scheduled provider appointments Call provider office for new concerns or questions  call the Suicide and Crisis Lifeline: 988 call the [place]  National Suicide Prevention Lifeline: 518-831-0421 or TTY: 6827166601 TTY 4801103970) to talk to a trained counselor call 1-800-273-TALK (toll free, 24 hour hotline) if experiencing a Mental Health or Behavioral Health Crisis  check blood pressure 3 times per week write blood pressure results in a log or diary learn about high blood pressure keep a blood pressure log take blood pressure log to all doctor appointments call doctor for signs and symptoms of high blood pressure develop an action plan for high blood pressure keep all doctor appointments take medications for blood pressure exactly as prescribed report new symptoms to your doctor  Follow Up Plan: Telephone follow up appointment with care management team member scheduled for: 09-04-2023 at 345 pm       RNCM Care Management Expected Outcomes: Monitor, Self-Manage and Reduce Symptoms of: CHF       Current Barriers:  Chronic Disease Management support and education needs related to CHF   RNCM Clinical Goal(s):  Patient will verbalize basic understanding of  CHF disease process and self health management plan as evidenced by verbal explanation, recognizing symptoms, lifestyle modifications take all medications exactly as prescribed and will call provider for medication related questions as evidenced by compliance with all medications attend all scheduled medical appointments: with primary care provider and specialist as evidenced by keeping all scheduled appointments demonstrate Improved and Ongoing adherence to prescribed treatment plan for CHF as evidenced by consistent medication compliance, symptom monitoring, lifestyle modifications, daily weights continue to work with RN Care Manager to address care management and care coordination needs related to  CHF as evidenced by adherence to CM Team Scheduled appointments through collaboration with RN Care manager, provider, and care team.   Interventions: Evaluation of current  treatment plan related to  self management and patient's adherence to plan as established by provider   Heart Failure Interventions:  (Status:  Goal on track:  Yes.) Long Term Goal Basic overview and discussion of pathophysiology of Heart Failure reviewed. Patient knowledgeable about disease process and when to seek help. Reports mild edema today.  Provided education on low sodium diet Reviewed Heart Failure Action Plan in depth and provided written copy Assessed need for readable accurate scales in home Provided education about placing scale on hard, flat surface Advised patient to weigh each morning after emptying bladder Discussed importance of daily weight and advised patient to weigh and record daily. Weight range 226-227 lbs.  Reviewed role of diuretics in prevention of fluid overload and management of heart failure; Discussed the importance of keeping all appointments with provider Provided patient with education about the role of exercise in the management of heart failure Advised patient to discuss any new changes with provider Screening for signs and symptoms of depression related to chronic disease state  Assessed social determinant of health barriers   Patient Goals/Self-Care Activities: Take all medications as prescribed Attend all scheduled provider appointments Call pharmacy for medication refills 3-7 days in advance of running out of medications Perform all self care activities independently  Call provider office for new concerns or questions  call office if I gain more than 2 pounds in one day or 5 pounds in one week keep legs up while sitting watch for swelling in feet, ankles and legs every day eat more whole grains, fruits and vegetables, lean meats  and healthy fats  Follow Up Plan:  Telephone follow up appointment with care management team member scheduled for:  09-04-2023 at 3:45 pm             Consent to Services:  Patient was given information about care  management services, agreed to services, and gave verbal consent to participate.   Plan: Telephone follow up appointment with care management team member scheduled for:09-04-2023 at 3:45 pm  Rosalene Billings, BSN RN [facility], [facility] RN Care Manager Direct Dial: [phone]  Fax: [phone]

## 2023-08-01 NOTE — Patient Outreach (Signed)
  Care Management   Follow Up Note   08/01/2023 Name: Dana [name] MRN: [id number] DOB: [birth year]   Referred by: Blane Ohara, MD Reason for referral : Care Management (RNCM: ATTEMPT #1 Follow Up For Chronic Disease Management & Care Coordination Needs)   An unsuccessful telephone outreach was attempted today. The patient was referred to the case management team for assistance with care management and care coordination.   Follow Up Plan: The care management team will reach out to the patient again over the next 30 days.   Rosalene Billings, BSN RN [facility], [facility] RN Care Manager Direct Dial: [phone]  Fax: [phone]

## 2023-08-01 NOTE — Patient Instructions (Signed)
Visit Information  Thank you for taking time to visit with me today. Please don't hesitate to contact me if I can be of assistance to you before our next scheduled telephone appointment.  Following are the goals we discussed today:   Goals Addressed             This Visit's Progress    RNCM Care Management Expected Outcome:  Monitor, Self-Manage and Reduce Symptoms of Diabetes       Current Barriers:  Chronic Disease Management support and education needs related to effective management of DM Lab Results  Component Value Date   HGBA1C 9.0 (H) 05/14/2023    Planned Interventions: Provided education to patient about basic DM disease process. Fasting glucose 92 this morning, highest: 193 lowest: 63. Patient knowledgeable about hypoglycemic education. She states her Lantus has been decreased to 37 units.  Reviewed medications with patient and discussed importance of medication adherence. Patient states compliance with medications.  Reviewed prescribed diet with patient heart healthy/ADA diet. Is compliant of heart healthy/ADA diet.  Counseled on importance of regular laboratory monitoring as prescribed. Has regular lab work done.  Discussed plans with patient for ongoing care management follow up and provided patient with direct contact information for care management team;      Provided patient with written educational materials related to hypo and hyperglycemia and importance of correct treatment.      Reviewed scheduled/upcoming provider appointments including:  Advised patient, providing education and rationale, to check cbg twice daily and when you have symptoms of low or high blood sugar and record. Has a continuous reader.       call provider for findings outside established parameters;       Referral made to community resources care guide team for assistance with food insecurities and resources in her area to help with expressed needs. Has talked with care guides and had resources  mailed.  ;      Review of patient status, including review of consultants reports, relevant laboratory and other test results, and medications completed;       Advised patient to discuss changes in DM, questions, and concerns with provider;      Screening for signs and symptoms of depression related to chronic disease state;        Assessed social determinant of health barriers;         Symptom Management: Take medications as prescribed   Attend all scheduled provider appointments Call provider office for new concerns or questions  call the Suicide and Crisis Lifeline: 988 call the [place] National Suicide Prevention Lifeline: 605-434-1166 or TTY: 216-774-7118 TTY (704)237-5337) to talk to a trained counselor call 1-800-273-TALK (toll free, 24 hour hotline) if experiencing a Mental Health or Behavioral Health Crisis  keep appointment with eye doctor check feet daily for cuts, sores or redness trim toenails straight across manage portion size wash and dry feet carefully every day wear comfortable, cotton socks wear comfortable, well-fitting shoes  Follow Up Plan: Telephone follow up appointment with care management team member scheduled for: 09-04-2023 at 345 pm       RNCM Care Management Expected Outcome:  Monitor, Self-Manage and Reduce Symptoms of: Depression, Anxiety, Stress       Current Barriers:  Knowledge Deficits related to resources available to help with expressed needs at this time due to her husband losing his job and his unemployment has expired Care Coordination needs related to resources to help meet financial needs at this time in a  patient with stress, anxiety, and depression  Chronic Disease Management support and education needs related to effective management of stress, anxiety, and depression Financial Constraints.   Planned Interventions: Evaluation of current treatment plan related to depression, stress, and anxiety and patient's adherence to plan as established  by provider. The patient states she is doing well and speaks with her counselor every 2 weeks or more if needed. Reviewed medications with patient and discussed compliance. The patient is compliant with medications.  Provided patient with care guide and SW support as well as support from the interdisciplinary team for management of stress, anxiety, and depression  educational materials related to effective management of stress, anxiety, and depression. Continues to decline LCSW referral at this time. Reviewed scheduled/upcoming provider appointments including  Care Guide referral for community resources for [facility] for food insecurities and assistance with utilities. Has information from the care guides  Social Work referral for education and support and resources for effective management of stress, anxiety, and depression.  Discussed plans with patient for ongoing care management follow up and provided patient with direct contact information for care management team Advised patient to discuss changes in mood, increased anxiety, depression, stress, or other mental health needs with provider Screening for signs and symptoms of depression related to chronic disease state  Assessed social determinant of health barriers  Symptom Management: Take medications as prescribed   Attend all scheduled provider appointments Call provider office for new concerns or questions  call the Suicide and Crisis Lifeline: 988 call the [place] National Suicide Prevention Lifeline: 626-257-0310 or TTY: 208-656-9132 TTY 351 040 4870) to talk to a trained counselor call 1-800-273-TALK (toll free, 24 hour hotline) if experiencing a Mental Health or Behavioral Health Crisis   Follow Up Plan: Telephone follow up appointment with care management team member scheduled for: 09-04-2023 at 345 pm       RNCM Care Management Expected Outcome:  Monitor, Self-Manage, and Reduce Symptoms of Hypertension       Current  Barriers:  Chronic Disease Management support and education needs related to effective management of HTN BP Readings from Last 3 Encounters:  07/29/23 130/62  07/20/23 (!) 151/53  07/12/23 (!) 104/58   Wt Readings from Last 3 Encounters:  07/29/23 226 lb 12.8 oz (102.9 kg)  07/18/23 236 lb 5.3 oz (107.2 kg)  07/12/23 232 lb (105.2 kg)    Planned Interventions: Evaluation of current treatment plan related to hypertension self management and patient's adherence to plan as established by provider. Regularly checks blood pressure. Provided education to patient re: stroke prevention, s/s of heart attack and stroke.  Reviewed prescribed diet heart healthy/ADA diet.  Reviewed medications with patient and discussed importance of compliance. Reports compliance with all medications. Discussed plans with patient for ongoing care management follow up and provided patient with direct contact information for care management team; Advised patient, providing education and rationale, to monitor blood pressure daily and record, calling PCP for findings outside established parameters.  Reviewed scheduled/upcoming provider appointments including:  Advised patient to discuss changes in her blood pressures or heart health  with provider; Provided education on prescribed diet heart healthy/ADA diet.  Discussed complications of poorly controlled blood pressure such as heart disease, stroke, circulatory complications, vision complications, kidney impairment, sexual dysfunction;  Screening for signs and symptoms of depression related to chronic disease state;  Assessed social determinant of health barriers;   Symptom Management: Take medications as prescribed   Attend all scheduled provider appointments Call provider office for new  concerns or questions  call the Suicide and Crisis Lifeline: 988 call the [place] National Suicide Prevention Lifeline: (601) 237-9134 or TTY: 213-888-9345 TTY 928 566 5798) to talk  to a trained counselor call 1-800-273-TALK (toll free, 24 hour hotline) if experiencing a Mental Health or Behavioral Health Crisis  check blood pressure 3 times per week write blood pressure results in a log or diary learn about high blood pressure keep a blood pressure log take blood pressure log to all doctor appointments call doctor for signs and symptoms of high blood pressure develop an action plan for high blood pressure keep all doctor appointments take medications for blood pressure exactly as prescribed report new symptoms to your doctor  Follow Up Plan: Telephone follow up appointment with care management team member scheduled for: 09-04-2023 at 345 pm       RNCM Care Management Expected Outcomes: Monitor, Self-Manage and Reduce Symptoms of: CHF       Current Barriers:  Chronic Disease Management support and education needs related to CHF   RNCM Clinical Goal(s):  Patient will verbalize basic understanding of  CHF disease process and self health management plan as evidenced by verbal explanation, recognizing symptoms, lifestyle modifications take all medications exactly as prescribed and will call provider for medication related questions as evidenced by compliance with all medications attend all scheduled medical appointments: with primary care provider and specialist as evidenced by keeping all scheduled appointments demonstrate Improved and Ongoing adherence to prescribed treatment plan for CHF as evidenced by consistent medication compliance, symptom monitoring, lifestyle modifications, daily weights continue to work with RN Care Manager to address care management and care coordination needs related to  CHF as evidenced by adherence to CM Team Scheduled appointments through collaboration with RN Care manager, provider, and care team.   Interventions: Evaluation of current treatment plan related to  self management and patient's adherence to plan as established by  provider   Heart Failure Interventions:  (Status:  Goal on track:  Yes.) Long Term Goal Basic overview and discussion of pathophysiology of Heart Failure reviewed. Patient knowledgeable about disease process and when to seek help. Reports mild edema today.  Provided education on low sodium diet Reviewed Heart Failure Action Plan in depth and provided written copy Assessed need for readable accurate scales in home Provided education about placing scale on hard, flat surface Advised patient to weigh each morning after emptying bladder Discussed importance of daily weight and advised patient to weigh and record daily. Weight range 226-227 lbs.  Reviewed role of diuretics in prevention of fluid overload and management of heart failure; Discussed the importance of keeping all appointments with provider Provided patient with education about the role of exercise in the management of heart failure Advised patient to discuss any new changes with provider Screening for signs and symptoms of depression related to chronic disease state  Assessed social determinant of health barriers   Patient Goals/Self-Care Activities: Take all medications as prescribed Attend all scheduled provider appointments Call pharmacy for medication refills 3-7 days in advance of running out of medications Perform all self care activities independently  Call provider office for new concerns or questions  call office if I gain more than 2 pounds in one day or 5 pounds in one week keep legs up while sitting watch for swelling in feet, ankles and legs every day eat more whole grains, fruits and vegetables, lean meats and healthy fats  Follow Up Plan:  Telephone follow up appointment with care management team member scheduled  for:  09-04-2023 at 3:45 pm           Our next appointment is by telephone on 09-04-2023 at 3:45 pm  Please call the care guide team at [phone] if you need to cancel or reschedule your  appointment.   If you are experiencing a Mental Health or Behavioral Health Crisis or need someone to talk to, please call the Suicide and Crisis Lifeline: 988 call the [place] National Suicide Prevention Lifeline: 814-883-5861 or TTY: 575-055-1991 TTY 256-866-8139) to talk to a trained counselor call 1-800-273-TALK (toll free, 24 hour hotline)   Patient verbalizes understanding of instructions and care plan provided today and agrees to view in MyChart. Active MyChart status and patient understanding of how to access instructions and care plan via MyChart confirmed with patient.     Telephone follow up appointment with care management team member scheduled for:09-04-2023 at 3:45 pm  Rosalene Billings, BSN RN [facility], [facility] RN Care Manager Direct Dial: [phone]  Fax: [phone]

## 2023-08-05 ENCOUNTER — Ambulatory Visit (HOSPITAL_COMMUNITY)
Admission: RE | Admit: 2023-08-05 | Discharge: 2023-08-05 | Disposition: A | Discharge status: Home / Self Care | Payer: PPO | Source: Ambulatory Visit | Attending: Internal Medicine

## 2023-08-05 ENCOUNTER — Encounter (HOSPITAL_COMMUNITY): Payer: Self-pay

## 2023-08-05 ENCOUNTER — Other Ambulatory Visit (HOSPITAL_COMMUNITY): Payer: Self-pay

## 2023-08-05 ENCOUNTER — Other Ambulatory Visit: Payer: Self-pay

## 2023-08-05 VITALS — BP 102/64 | HR 61 | Wt 227.8 lb

## 2023-08-05 DIAGNOSIS — N1832 Chronic kidney disease, stage 3b: Secondary | ICD-10-CM | POA: Diagnosis not present

## 2023-08-05 DIAGNOSIS — I252 Old myocardial infarction: Secondary | ICD-10-CM | POA: Insufficient documentation

## 2023-08-05 DIAGNOSIS — I2510 Atherosclerotic heart disease of native coronary artery without angina pectoris: Secondary | ICD-10-CM | POA: Insufficient documentation

## 2023-08-05 DIAGNOSIS — N184 Chronic kidney disease, stage 4 (severe): Secondary | ICD-10-CM | POA: Insufficient documentation

## 2023-08-05 DIAGNOSIS — I340 Nonrheumatic mitral (valve) insufficiency: Secondary | ICD-10-CM | POA: Diagnosis not present

## 2023-08-05 DIAGNOSIS — Z9889 Other specified postprocedural states: Secondary | ICD-10-CM

## 2023-08-05 DIAGNOSIS — E1051 Type 1 diabetes mellitus with diabetic peripheral angiopathy without gangrene: Secondary | ICD-10-CM | POA: Insufficient documentation

## 2023-08-05 DIAGNOSIS — I480 Paroxysmal atrial fibrillation: Secondary | ICD-10-CM | POA: Insufficient documentation

## 2023-08-05 DIAGNOSIS — E1022 Type 1 diabetes mellitus with diabetic chronic kidney disease: Secondary | ICD-10-CM | POA: Diagnosis not present

## 2023-08-05 DIAGNOSIS — G4733 Obstructive sleep apnea (adult) (pediatric): Secondary | ICD-10-CM | POA: Insufficient documentation

## 2023-08-05 DIAGNOSIS — Z95818 Presence of other cardiac implants and grafts: Secondary | ICD-10-CM | POA: Diagnosis not present

## 2023-08-05 DIAGNOSIS — E785 Hyperlipidemia, unspecified: Secondary | ICD-10-CM | POA: Insufficient documentation

## 2023-08-05 DIAGNOSIS — Z79899 Other long term (current) drug therapy: Secondary | ICD-10-CM | POA: Insufficient documentation

## 2023-08-05 DIAGNOSIS — Z89422 Acquired absence of other left toe(s): Secondary | ICD-10-CM | POA: Diagnosis not present

## 2023-08-05 DIAGNOSIS — Z89412 Acquired absence of left great toe: Secondary | ICD-10-CM | POA: Insufficient documentation

## 2023-08-05 DIAGNOSIS — I130 Hypertensive heart and chronic kidney disease with heart failure and stage 1 through stage 4 chronic kidney disease, or unspecified chronic kidney disease: Secondary | ICD-10-CM | POA: Diagnosis not present

## 2023-08-05 DIAGNOSIS — I5032 Chronic diastolic (congestive) heart failure: Secondary | ICD-10-CM | POA: Diagnosis not present

## 2023-08-05 DIAGNOSIS — I5030 Unspecified diastolic (congestive) heart failure: Secondary | ICD-10-CM | POA: Diagnosis present

## 2023-08-05 DIAGNOSIS — Z955 Presence of coronary angioplasty implant and graft: Secondary | ICD-10-CM | POA: Insufficient documentation

## 2023-08-05 DIAGNOSIS — Z7982 Long term (current) use of aspirin: Secondary | ICD-10-CM | POA: Diagnosis not present

## 2023-08-05 DIAGNOSIS — Z7902 Long term (current) use of antithrombotics/antiplatelets: Secondary | ICD-10-CM | POA: Diagnosis not present

## 2023-08-05 DIAGNOSIS — Z87891 Personal history of nicotine dependence: Secondary | ICD-10-CM | POA: Insufficient documentation

## 2023-08-05 MED ORDER — CARVEDILOL 6.25 MG PO TABS
9.3750 mg | ORAL_TABLET | Freq: Two times a day (BID) | ORAL | 3 refills | Status: DC
  Filled 2023-08-05: qty 90, 30d supply, fill #0
  Filled 2023-09-01: qty 90, 30d supply, fill #1
  Filled 2023-09-29: qty 90, 30d supply, fill #2
  Filled 2023-11-14: qty 90, 30d supply, fill #3

## 2023-08-05 MED ORDER — ISOSORBIDE MONONITRATE ER 30 MG PO TB24
30.0000 mg | ORAL_TABLET | Freq: Every day | ORAL | 3 refills | Status: DC
  Filled 2023-08-05: qty 30, 30d supply, fill #0
  Filled 2023-09-01: qty 30, 30d supply, fill #1
  Filled 2023-09-29: qty 30, 30d supply, fill #2
  Filled 2023-11-14: qty 30, 30d supply, fill #3

## 2023-08-05 MED ORDER — CARVEDILOL 6.25 MG PO TABS
9.3750 mg | ORAL_TABLET | Freq: Two times a day (BID) | ORAL | 3 refills | Status: DC
  Filled 2023-08-05: qty 45, 15d supply, fill #0

## 2023-08-05 NOTE — Patient Instructions (Signed)
Medication Changes:  START: ISOSORBIDE 30MG  ONCE DAILY   DECREASE CARVEDILOL TO 9.375 MG TWICE DAILY- NEW PRESCRIPTION SENT IN FOR THE 6.25MG  TABLETS (PLEASE TAKE 1.5 TABLETS TWICE DAILY)   PRESCRIPTION GIVEN FOR COMPRESSION STOCKINGS   Follow-Up in: 3 MONTHS PLEASE CALL OUR OFFICE AROUND THE END OF FEB  TO GET SCHEDULED FOR YOUR APPOINTMENT. PHONE NUMBER IS [phone] OPTION 2  At the [facility], you and your health needs are our priority. We have a designated team specialized in the treatment of Heart Failure. This Care Team includes your primary Heart Failure Specialized Cardiologist (physician), Advanced Practice Providers (APPs- Physician Assistants and Nurse Practitioners), and Pharmacist who all work together to provide you with the care you need, when you need it.   You may see any of the following providers on your designated Care Team at your next follow up:  Dr. Arvilla Meres Dr. Marca Ancona Dr. Dorthula Nettles Dr. Theresia Bough Tonye Becket, NP Robbie Lis, [place] [facility] [place], [place] Brynda Peon, NP [place] Lee, NP Karle Plumber, PharmD   Please be sure to bring in all your medications bottles to every appointment.   Need to Contact us:  If you have any questions or concerns before your next appointment please send [name] a message through Mio or call our office at [phone].    TO LEAVE A MESSAGE FOR THE NURSE SELECT OPTION 2, PLEASE LEAVE A MESSAGE INCLUDING: YOUR NAME DATE OF BIRTH CALL BACK NUMBER REASON FOR CALL**this is important as we prioritize the call backs  YOU WILL RECEIVE A CALL BACK THE SAME DAY AS LONG AS YOU CALL BEFORE 4:00 PM

## 2023-08-13 ENCOUNTER — Other Ambulatory Visit: Payer: Self-pay

## 2023-08-13 ENCOUNTER — Other Ambulatory Visit: Payer: Self-pay | Admitting: Family Medicine

## 2023-08-13 ENCOUNTER — Other Ambulatory Visit (HOSPITAL_COMMUNITY): Payer: Self-pay

## 2023-08-13 MED ORDER — ASPIRIN 81 MG PO TBEC
81.0000 mg | DELAYED_RELEASE_TABLET | Freq: Every day | ORAL | 12 refills | Status: AC
  Filled 2023-08-13: qty 30, 30d supply, fill #0
  Filled 2023-08-14 – 2023-09-15 (×2): qty 30, 30d supply, fill #1
  Filled 2023-10-16: qty 30, 30d supply, fill #2
  Filled 2023-11-14: qty 30, 30d supply, fill #3
  Filled 2023-12-11: qty 30, 30d supply, fill #4
  Filled 2024-02-08: qty 30, 30d supply, fill #5
  Filled 2024-03-23: qty 30, 30d supply, fill #6
  Filled 2024-04-18: qty 30, 30d supply, fill #7
  Filled 2024-05-24: qty 30, 30d supply, fill #8
  Filled 2024-06-20: qty 30, 30d supply, fill #9
  Filled 2024-08-05: qty 30, 30d supply, fill #10

## 2023-08-13 MED ORDER — GABAPENTIN 100 MG PO CAPS
100.0000 mg | ORAL_CAPSULE | Freq: Two times a day (BID) | ORAL | 0 refills | Status: DC
  Filled 2023-08-13 – 2023-08-14 (×2): qty 180, 90d supply, fill #0

## 2023-08-14 ENCOUNTER — Other Ambulatory Visit: Payer: Self-pay

## 2023-08-14 ENCOUNTER — Other Ambulatory Visit (HOSPITAL_COMMUNITY): Payer: Self-pay

## 2023-08-14 ENCOUNTER — Other Ambulatory Visit: Payer: Self-pay | Admitting: Family Medicine

## 2023-08-14 MED ORDER — HYDROCODONE-ACETAMINOPHEN 7.5-325 MG PO TABS
1.0000 | ORAL_TABLET | Freq: Three times a day (TID) | ORAL | 0 refills | Status: DC | PRN
  Filled 2023-08-14 – 2023-09-01 (×2): qty 90, 30d supply, fill #0

## 2023-08-14 MED ORDER — FREESTYLE LIBRE 2 SENSOR MISC
2 refills | Status: DC
  Filled 2023-08-14: qty 2, 28d supply, fill #0
  Filled 2023-09-15: qty 2, 28d supply, fill #1
  Filled 2023-10-16: qty 2, 28d supply, fill #2

## 2023-08-14 MED ORDER — LATANOPROST 0.005 % OP SOLN
1.0000 [drp] | Freq: Every day | OPHTHALMIC | 0 refills | Status: DC
  Filled 2023-08-14: qty 2.5, 50d supply, fill #0

## 2023-08-14 MED ORDER — BUSPIRONE HCL 10 MG PO TABS
10.0000 mg | ORAL_TABLET | Freq: Three times a day (TID) | ORAL | 2 refills | Status: DC
  Filled 2023-08-14: qty 90, 30d supply, fill #0
  Filled 2023-09-15: qty 90, 30d supply, fill #1

## 2023-08-14 NOTE — Telephone Encounter (Signed)
I sent the Rx to the pharmacy.

## 2023-08-15 ENCOUNTER — Other Ambulatory Visit: Payer: Self-pay

## 2023-08-15 ENCOUNTER — Other Ambulatory Visit (HOSPITAL_COMMUNITY): Payer: Self-pay

## 2023-08-16 ENCOUNTER — Other Ambulatory Visit (HOSPITAL_COMMUNITY): Payer: Self-pay

## 2023-08-20 DIAGNOSIS — F411 Generalized anxiety disorder: Secondary | ICD-10-CM | POA: Diagnosis not present

## 2023-08-22 ENCOUNTER — Ambulatory Visit (INDEPENDENT_AMBULATORY_CARE_PROVIDER_SITE_OTHER): Payer: PPO

## 2023-08-22 VITALS — BP 104/64 | HR 66 | Temp 97.4°F | Ht 64.5 in | Wt 223.0 lb

## 2023-08-22 DIAGNOSIS — J069 Acute upper respiratory infection, unspecified: Secondary | ICD-10-CM

## 2023-08-22 HISTORY — DX: Acute upper respiratory infection, unspecified: J06.9

## 2023-08-22 NOTE — Progress Notes (Signed)
 Acute Office Visit  Subjective:    Patient ID: Dana [name], female    DOB: 06/14/1961, 63 y.o.   MRN: [id number]  Chief Complaint  Patient presents with   Cough   Nasal Congestion   Sore Throat    Discussed the use of AI scribe software for clinical note transcription with the patient, who gave verbal consent to proceed.      HPI: Patient is in today for congestion, cough, sore throat, tirdness. Patient states all started yesterday. Patient has taking otc cold and flu medication.  Dana [name] is a 63 year old female with recurrent bronchitis and pneumonia who presents with a sore throat and chest tightness.  She has been experiencing a sore throat and chest tightness that began yesterday. The sore throat is described as a 'really bad scratchy throat' and feels like it is moving into her chest. She has a dry, hard cough, nausea, exhaustion, and a runny nose. No congestion or chills are present. She is unsure about having a fever, but her mother mentioned she had one yesterday. She also reports body aches.  She has a history of recurrent bronchitis and pneumonia, which has led to hospitalizations in the past. She is concerned that her current symptoms might develop into bronchitis or pneumonia, as this has happened before.  Her last blood work on January 13th showed a kidney filtration rate of 25 and a hemoglobin level of 10. Her blood sugars have been running okay, but she has experienced some lows. She is currently on 38 units of Lantus , reduced from 60 units. Her last A1c was 9.0 on October 29th, and she has not had blood work done since then.  She has been using gluten sugar-free lozenges for her throat and took Mucinex  Cold and Flu, with two doses yesterday and one at 7 AM today. She initially felt some relief in her throat but no improvement in chest symptoms.  Past Medical History:  Diagnosis Date   Acquired absence of left great toe (HCC)    Acquired absence of other  left toe(s) (HCC)    ACS (acute coronary syndrome) (HCC)    Anemia    Anoxic brain injury (HCC)    Anxiety    CAD (coronary artery disease)    DES mLAD 04/07/15; DES dRCA & DES pPDA 08/30/16; DES mCX & PTCA OM1 09/29/20   Chronic diastolic (congestive) heart failure (HCC)    Chronic pain    Chronic systolic (congestive) heart failure (HCC)    CKD (chronic kidney disease)    Contrast dye induced nephropathy, possible 09/03/2020   COPD (chronic obstructive pulmonary disease) (HCC)    COVID-19 virus infection 03/03/2023   Diabetes (HCC)type 1    Diastolic CHF (HCC)    Dyspnea    Family history of breast cancer 10/23/2021   Gastro-esophageal reflux disease without esophagitis    Hyperlipidemia    Hypertension    Hypertensive heart disease with heart failure (HCC)    Hypoxemia    Idiopathic gout, multiple sites    Leukocytosis 03/29/2022   Localized edema    Low back pain    Mitral regurgitation    Mitral regurgitation    Mixed hyperlipidemia    Mixed incontinence    Morbid (severe) obesity due to excess calories (HCC)    Neuromuscular disorder (HCC)    Neuropathy    Non-ST elevation (NSTEMI) myocardial infarction (HCC)    08/29/20, 09/29/20   OSA (obstructive sleep apnea) 09/03/2020  Other specified hypothyroidism    PAF (paroxysmal atrial fibrillation) (HCC)    s/p pulmonary vein isolation by cryoablation 10/04/15 Dr. Meldon in [facility]   PEA (Pulseless electrical activity) [place])    ~ 01/13/21 at [facility] during admission DKA, volume overload, possible CAP, hypoxia s/p ACLS with ROSC ~ 10-15   Pneumonia    Primary insomnia    Rheumatoid arthritis (HCC)    Stroke (HCC)    Thyroid  disease    Type 1 diabetes mellitus (HCC)    Vitamin D  deficiency     Past Surgical History:  Procedure Laterality Date   ABDOMINAL HYSTERECTOMY     Partial- Still has both ovaries   CARDIOVASCULAR STRESS TEST  08/13/2014   Nuclear; Normal   CARPAL TUNNEL RELEASE     CATARACT  EXTRACTION, BILATERAL     CESAREAN SECTION     CHOLECYSTECTOMY     CORONARY ANGIOPLASTY WITH STENT PLACEMENT     3 blockages/ 1 stent   CORONARY BALLOON ANGIOPLASTY N/A 07/18/2023   Procedure: CORONARY BALLOON ANGIOPLASTY;  Surgeon: Verlin Lonni BIRCH, MD;  Location: [facility] INVASIVE CV LAB;  Service: Cardiovascular;  Laterality: N/A;   CORONARY PRESSURE/FFR STUDY N/A 09/07/2022   Procedure: INTRAVASCULAR PRESSURE WIRE/FFR STUDY;  Surgeon: Jordan, Peter M, MD;  Location: [facility] INVASIVE CV LAB;  Service: Cardiovascular;  Laterality: N/A;   CORONARY STENT INTERVENTION N/A 09/29/2020   Procedure: CORONARY STENT INTERVENTION;  Surgeon: Wonda Sharper, MD;  Location: [facility] INVASIVE CV LAB;  Service: Cardiovascular;  Laterality: N/A;  CFX   CORONARY STENT INTERVENTION N/A 09/07/2022   Procedure: CORONARY STENT INTERVENTION;  Surgeon: Jordan, Peter M, MD;  Location: [facility] INVASIVE CV LAB;  Service: Cardiovascular;  Laterality: N/A;   CORONARY STENT INTERVENTION N/A 02/13/2023   Procedure: CORONARY STENT INTERVENTION;  Surgeon: Verlin Lonni BIRCH, MD;  Location: [facility] INVASIVE CV LAB;  Service: Cardiovascular;  Laterality: N/A;  LAD   CORONARY ULTRASOUND/IVUS N/A 02/13/2023   Procedure: Coronary Ultrasound/IVUS;  Surgeon: Verlin Lonni BIRCH, MD;  Location: [facility] INVASIVE CV LAB;  Service: Cardiovascular;  Laterality: N/A;   CORONARY/GRAFT ACUTE MI REVASCULARIZATION N/A 12/08/2022   Procedure: Coronary/Graft Acute MI Revascularization;  Surgeon: Anner Alm ORN, MD;  Location: [facility] INVASIVE CV LAB;  Service: Cardiovascular;  Laterality: N/A;   EYE SURGERY     LEFT HEART CATH AND CORONARY ANGIOGRAPHY N/A 09/29/2020   Procedure: LEFT HEART CATH AND CORONARY ANGIOGRAPHY;  Surgeon: Wonda Sharper, MD;  Location: [facility] INVASIVE CV LAB;  Service: Cardiovascular;  Laterality: N/A;   LEFT HEART CATH AND CORONARY ANGIOGRAPHY N/A 09/07/2022   Procedure: LEFT HEART CATH AND CORONARY ANGIOGRAPHY;  Surgeon: Jordan, Peter M, MD;   Location: [facility] INVASIVE CV LAB;  Service: Cardiovascular;  Laterality: N/A;   LEFT HEART CATH AND CORONARY ANGIOGRAPHY N/A 12/08/2022   Procedure: LEFT HEART CATH AND CORONARY ANGIOGRAPHY;  Surgeon: Anner Alm ORN, MD;  Location: [facility] INVASIVE CV LAB;  Service: Cardiovascular;  Laterality: N/A;   LEFT HEART CATH AND CORONARY ANGIOGRAPHY N/A 02/13/2023   Procedure: LEFT HEART CATH AND CORONARY ANGIOGRAPHY;  Surgeon: Verlin Lonni BIRCH, MD;  Location: [facility];  Service: Cardiovascular;  Laterality: N/A;   LEFT HEART CATH AND CORONARY ANGIOGRAPHY N/A 07/16/2023   Procedure: LEFT HEART CATH AND CORONARY ANGIOGRAPHY;  Surgeon: Verlin Lonni BIRCH, MD;  Location: [facility] INVASIVE CV LAB;  Service: Cardiovascular;  Laterality: N/A;   RIGHT HEART CATH N/A 04/27/2021   Procedure: RIGHT HEART CATH;  Surgeon: Cherrie Toribio SAUNDERS, MD;  Location: [facility] INVASIVE CV LAB;  Service: Cardiovascular;  Laterality: N/A;   RIGHT/LEFT HEART CATH AND CORONARY ANGIOGRAPHY N/A 01/07/2017   Procedure: Right/Left Heart Cath and Coronary Angiography;  Surgeon: Rolan Ezra RAMAN, MD;  Location: [facility] INVASIVE CV LAB;  Service: Cardiovascular;  Laterality: N/A;   ROTATOR CUFF REPAIR     TEE WITHOUT CARDIOVERSION N/A 04/28/2021   Procedure: TRANSESOPHAGEAL ECHOCARDIOGRAM (TEE);  Surgeon: Cherrie Toribio SAUNDERS, MD;  Location: [facility] ENDOSCOPY;  Service: Cardiovascular;  Laterality: N/A;   TEE WITHOUT CARDIOVERSION N/A 05/18/2021   Procedure: TRANSESOPHAGEAL ECHOCARDIOGRAM (TEE);  Surgeon: Thukkani, Arun K, MD;  Location: [facility] INVASIVE CV LAB;  Service: Cardiovascular;  Laterality: N/A;   TOE AMPUTATION Left    TRANSCATHETER MITRAL EDGE TO EDGE REPAIR N/A 05/18/2021   Procedure: MITRAL VALVE REPAIR;  Surgeon: Wendel Lurena POUR, MD;  Location: [facility] INVASIVE CV LAB;  Service: Cardiovascular;  Laterality: N/A;   US  ECHOCARDIOGRAPHY  05/2017   Normal    Family History  Problem Relation Age of Onset   Breast cancer Mother 44        contralateral breast ca dx 38   Coronary artery disease Mother    Hypertension Mother    Hyperlipidemia Mother    Diabetes type II Mother    Lung cancer Mother 30   Diabetes Father    Hypertension Father    Mental illness Sister    Bipolar disorder Other    Depression Other    Schizophrenia Other    Cancer Other        MGF's sisters x2; unknown type    Social History   Socioeconomic History   Marital status: Married    Spouse name: [name]   Number of children: 2   Years of education: 16   Highest education level: Master's degree (e.g., MA, MS, MEng, MEd, MSW, MBA)  Occupational History   Occupation: on disability/retired early former engineer, site  Tobacco Use   Smoking status: Former    Current packs/day: 0.00    Types: Cigarettes    Start date: 54    Quit date: 1984    Years since quitting: 41.1    Passive exposure: Past   Smokeless tobacco: Never  Vaping Use   Vaping status: Never Used  Substance and Sexual Activity   Alcohol use: Never   Drug use: Never   Sexual activity: Not on file  Other Topics Concern   Not on file  Social History Narrative   Not on file   Social Drivers of Health   Financial Resource Strain: Low Risk  (07/01/2023)   Overall Financial Resource Strain (CARDIA)    Difficulty of Paying Living Expenses: Not hard at all  Food Insecurity: No Food Insecurity (07/14/2023)   Hunger Vital Sign    Worried About Running Out of Food in the Last Year: Never true    Ran Out of Food in the Last Year: Never true  Transportation Needs: No Transportation Needs (07/14/2023)   PRAPARE - Administrator, Civil Service (Medical): No    Lack of Transportation (Non-Medical): No  Physical Activity: Insufficiently Active (07/01/2023)   Exercise Vital Sign    Days of Exercise per Week: 5 days    Minutes of Exercise per Session: 20 min  Stress: Stress Concern Present (07/01/2023)   [employer] of Occupational Health - Occupational Stress  Questionnaire    Feeling of Stress : To some extent  Social Connections: Moderately Integrated (07/25/2022)   Social Connection and Isolation  Panel [NHANES]    Frequency of Communication with Friends and Family: More than three times a week    Frequency of Social Gatherings with Friends and Family: More than three times a week    Attends Religious Services: More than 4 times per year    Active Member of [employer] or Organizations: No    Attends Banker Meetings: Never    Marital Status: Married  Catering Manager Violence: Not At Risk (07/14/2023)   Humiliation, Afraid, Rape, and Kick questionnaire    Fear of Current or Ex-Partner: No    Emotionally Abused: No    Physically Abused: No    Sexually Abused: No    Outpatient Medications Prior to Visit  Medication Sig Dispense Refill   allopurinol  (ZYLOPRIM ) 100 MG tablet Take 0.5 tablets (50 mg total) by mouth daily. 90 tablet 1   aspirin  EC 81 MG tablet Take 1 tablet (81 mg total) by mouth daily. Swallow whole. 30 tablet 12   busPIRone  (BUSPAR ) 10 MG tablet Take 1 tablet (10 mg total) by mouth 3 (three) times daily. 90 tablet 2   carvedilol  (COREG ) 6.25 MG tablet Take 1.5 tablets (9.375 mg total) by mouth 2 (two) times daily. 90 tablet 3   Continuous Glucose Sensor (FREESTYLE LIBRE 2 SENSOR) MISC Inject 1 device into skin every 14 (fourteen) days. 2 each 2   CRANBERRY PO Take 2 tablets by mouth daily.     EPINEPHrine  0.3 mg/0.3 mL IJ SOAJ injection Use as directed for life-threatening allergic reaction. 4 each 3   Evolocumab  (REPATHA  SURECLICK) 140 MG/ML SOAJ Inject 140 mg into the skin every 14 (fourteen) days. 6 mL 1   ezetimibe  (ZETIA ) 10 MG tablet Take 1 tablet (10 mg total) by mouth every evening. 90 tablet 1   gabapentin  (NEURONTIN ) 100 MG capsule Take 1 capsule (100 mg total) by mouth 2 (two) times daily. 180 capsule 0   Glucagon  (GVOKE HYPOPEN  2-PACK) 0.5 MG/0.1ML SOAJ Inject 0.5 mg into the skin daily as needed. 0.2 mL 5    HYDROcodone -acetaminophen  (NORCO) 7.5-325 MG tablet Take 1 tablet by mouth 3 (three) times daily as needed. 90 tablet 0   insulin  aspart (NOVOLOG  FLEXPEN) 100 UNIT/ML FlexPen Inject 2 - 10 Units before meals based on SSI. (Patient taking differently: Inject 2-10 Units into the skin See admin instructions. Inject 2-10 units into the skin before meals, based on SSI) 15 mL 3   insulin  glargine (LANTUS  SOLOSTAR) 100 UNIT/ML Solostar Pen Inject 60 Units into the skin daily. (Patient taking differently: Inject 40 Units into the skin daily.) 30 mL 5   insulin  glargine (LANTUS  SOLOSTAR) 100 UNIT/ML Solostar Pen Inject 40 Units into the skin daily.     Insulin  Pen Needle (BD PEN NEEDLE NANO 2ND GEN) 32G X 4 MM MISC Inject 1 each into the skin in the morning, at noon, in the evening, and at bedtime. 600 each 3   isosorbide  mononitrate (IMDUR ) 30 MG 24 hr tablet Take 1 tablet (30 mg total) by mouth daily. 30 tablet 3   latanoprost  (XALATAN ) 0.005 % ophthalmic solution Place 1 drop into both eyes at bedtime. 2.5 mL 0   linaclotide  (LINZESS ) 145 MCG CAPS capsule Take 1 capsule (145 mcg total) by mouth daily before breakfast. (Patient taking differently: Take 145 mcg by mouth daily as needed (for constipation).) 90 capsule 1   LORazepam  (ATIVAN ) 0.5 MG tablet Take 1 tablet (0.5 mg total) by mouth daily as needed for anxiety. 90 tablet 1  magnesium  oxide (MAG-OX) 400 MG tablet Take 2 tablets (800 mg total) by mouth daily. Take 2 (400 mg) daily=800 mg (Patient taking differently: Take 800 mg by mouth daily.) 180 tablet 1   nitroGLYCERIN  (NITROSTAT ) 0.4 MG SL tablet DISSOLVE ONE TABLET UNDER THE TONGUE EVERY 5 MINUTES AS NEEDED FOR CHEST PAIN.  DO NOT EXCEED A TOTAL OF 3 DOSES IN 15 MINUTES (Patient taking differently: DISSOLVE ONE TABLET UNDER THE TONGUE EVERY 5 MINUTES AS NEEDED FOR CHEST PAIN.  DO NOT EXCEED A TOTAL OF 3 DOSES IN 15 MINUTES) 50 tablet 3   Omega-3 Fatty Acids (FISH OIL PO) Take 1 capsule by mouth  daily.     OXYGEN  Inhale 2 L/min into the lungs at bedtime.     pantoprazole  (PROTONIX ) 40 MG tablet Take 1 tablet (40 mg total) by mouth daily. (Patient taking differently: Take 40 mg by mouth daily before breakfast.) 90 tablet 1   potassium chloride  SA (KLOR-CON  M) 20 MEQ tablet Take 2 tablets (40 mEq total) by mouth daily. (Patient taking differently: Take 20 mEq by mouth See admin instructions. Take 20 mEq by mouth on Mondays  and Thursdays ONLY) 180 tablet 1   PRESCRIPTION MEDICATION See admin instructions. CPAP- At bedtime     Probiotic CAPS Take 1 capsule by mouth daily.     promethazine  (PHENERGAN ) 25 MG tablet Take 1 tablet (25 mg total) by mouth daily as needed. 90 tablet 1   ticagrelor  (BRILINTA ) 90 MG TABS tablet Take 1 tablet (90 mg total) by mouth 2 (two) times daily. 180 tablet 1   torsemide  (DEMADEX ) 20 MG tablet Take 3 tablets (60 mg total) by mouth 2 (two) times daily for 3 days, THEN 3 tablets (60 mg total) daily. (Patient taking differently:  3 tablets (60 mg total) daily.) 108 tablet 0   No facility-administered medications prior to visit.    Allergies  Allergen Reactions   Naproxen Hives and Rash   Shellfish-Derived Products Hives, Swelling and Other (See Comments)    Angioedema    Strawberry (Diagnostic) Hives   Celecoxib Other (See Comments)    Stomach pain   Glucosamine Hives, Swelling and Other (See Comments)    Angioedema    Iron  Hives and Other (See Comments)    IV iron  transfusion - hives - Feb 2022 hospitalization  Patient said she CAN tolerate if given Benadryl  first   Metoclopramide Other (See Comments)    Facial twitching and stuttering   Metolazone  Other (See Comments)    Acute renal failure   Statins Other (See Comments)    Muscle pain    Review of Systems  Constitutional:  Positive for fatigue. Negative for chills and fever.  HENT:  Positive for congestion and sore throat. Negative for ear pain and sinus pain.   Respiratory:  Positive for  cough. Negative for shortness of breath.   Cardiovascular:  Negative for chest pain.  Gastrointestinal:  Positive for nausea. Negative for abdominal pain, constipation, diarrhea and vomiting.  Musculoskeletal:  Negative for myalgias.  Neurological:  Positive for headaches. Negative for dizziness.       Objective:        08/22/2023    2:41 PM 08/05/2023    9:56 AM 07/29/2023   10:47 AM  Vitals with BMI  Height 5' 4.5  5' 4.5  Weight 223 lbs 227 lbs 13 oz 226 lbs 13 oz  BMI 37.7 38.51 38.34  Systolic 104 102 869  Diastolic 64 64 62  Pulse 66  61 66    No data found.   Physical Exam Vitals and nursing note reviewed.  Constitutional:      Appearance: She is obese.     Comments: HEENT: Right and left external auditory canals clear, tympanic membranes normal without erythema, light reflex present and normal. Nasal mucosa inflamed with evidence of recent nosebleed. Tonsils without erythema or exudate, uvula midline. NECK: No swollen lymph nodes palpated. CHEST: Lungs clear to auscultation bilaterally, no wheezing or signs of pneumonia. CARDIOVASCULAR: Heart sounds normal.  Cardiovascular:     Rate and Rhythm: Normal rate and regular rhythm.  Pulmonary:     Effort: Pulmonary effort is normal.     Breath sounds: Normal breath sounds.  Neurological:     Mental Status: She is alert.     Health Maintenance Due  Topic Date Due   Zoster Vaccines- Shingrix (1 of 2) Never done   Cervical Cancer Screening (HPV/Pap Cotest)  Never done   MAMMOGRAM  03/15/2023   COVID-19 Vaccine (5 - 2024-25 season) 05/30/2023    There are no preventive care reminders to display for this patient.   Lab Results  Component Value Date   TSH 2.900 01/19/2022   Lab Results  Component Value Date   WBC 9.3 07/29/2023   HGB 10.0 (L) 07/29/2023   HCT 32.6 (L) 07/29/2023   MCV 89 07/29/2023   PLT 309 07/29/2023   Lab Results  Component Value Date   NA 143 07/29/2023   K CANCELED 07/29/2023    CO2 25 07/29/2023   GLUCOSE CANCELED 07/29/2023   BUN 57 (H) 07/29/2023   CREATININE 2.23 (H) 07/29/2023   BILITOT 0.5 07/29/2023   ALKPHOS 176 (H) 07/29/2023   AST 16 07/29/2023   ALT 20 07/29/2023   PROT 7.4 07/29/2023   ALBUMIN  4.0 07/29/2023   CALCIUM  8.9 07/29/2023   ANIONGAP 9 07/20/2023   EGFR 25 (L) 07/29/2023   Lab Results  Component Value Date   CHOL 92 (L) 07/12/2023   Lab Results  Component Value Date   HDL 47 07/12/2023   Lab Results  Component Value Date   LDLCALC 28 07/12/2023   Lab Results  Component Value Date   TRIG 81 07/12/2023   Lab Results  Component Value Date   CHOLHDL 2.0 07/12/2023   Lab Results  Component Value Date   HGBA1C 9.0 (H) 05/14/2023       Assessment & Plan:  URI with cough and congestion Assessment & Plan: Acute onset of sore throat, dry cough, rhinorrhea, and myalgia starting yesterday. COVID-19, influenza, and strep tests are negative. Physical exam shows no bacterial infection or pneumonia. Likely viral etiology. Discussed natural course of viral infections, potential progression to bronchitis or pneumonia, and importance of symptom monitoring and hydration. Explained that 95% of bronchitis cases are viral and resolve within weeks. Discussed potential use of inhalers and prednisone  if symptoms worsen, with caution due to diabetes. - Encourage fluid intake - Recommend steam inhalation - Advise over-the-counter medications such as Claritin or Zyrtec - Avoid medications with DM - Use plain Mucinex  expectorant - Consider liquid Tylenol  for throat pain - Monitor symptoms and call if condition worsens - Follow up if symptoms persist or worsen by next week  Orders: -     POC COVID-19 BinaxNow -     POCT Influenza A/B     No orders of the defined types were placed in this encounter.   Orders Placed This Encounter  Procedures   POC COVID-19  Influenza A/B     Follow-up: Return if symptoms worsen or fail to  improve.  An After Visit Summary was printed and given to the patient.  Jeanne Terrance, MD [facility] [phone]

## 2023-08-22 NOTE — Patient Instructions (Signed)
 VISIT SUMMARY:  Today, we discussed your recent symptoms of a sore throat and chest tightness, along with your history of recurrent bronchitis and pneumonia. We reviewed your current medications and recent lab results, and we performed tests to rule out COVID-19, influenza, and strep. Based on your symptoms and test results, we have determined that you likely have a viral upper respiratory infection.  YOUR PLAN:  -UPPER RESPIRATORY INFECTION (URI): A viral upper respiratory infection is an infection that affects the nose, throat, and airways. It is usually caused by a virus and often resolves on its own. We recommend that you stay hydrated, use steam inhalation, and take over-the-counter medications like Claritin or Zyrtec. Avoid medications with DM and use plain Mucinex  expectorant. You can also consider using liquid Tylenol  for throat pain. Monitor your symptoms and call us  if your condition worsens. Follow up if your symptoms persist or worsen by next week.  -DIABETES MELLITUS: Diabetes Mellitus is a condition where your blood sugar levels are higher than normal. Your recent A1c was 9.0, indicating that your blood sugar levels have been high. Continue taking Lantus  at 38 units and monitor your blood glucose levels closely. Avoid prednisone  as it can affect your blood sugar levels.  -CHRONIC KIDNEY DISEASE (CKD): Chronic Kidney Disease is a condition where your kidneys are not functioning as well as they should. Your recent eGFR was 25, which indicates reduced kidney function. Avoid NSAIDs and use Tylenol  for pain management. Monitor your kidney function regularly.  -GENERAL HEALTH MAINTENANCE: You have received your flu vaccine. It is important to maintain general health practices such as staying hydrated and getting plenty of rest. Use the patient portal for communication and monitor for any signs of worsening infection.  INSTRUCTIONS:  Follow up if your symptoms persist or worsen by next week.  Contact us  if you experience high fevers, a productive cough, or shortness of breath. We are available on call and in the office for a half-day tomorrow and will be back on Monday.

## 2023-08-23 ENCOUNTER — Ambulatory Visit: Payer: PPO | Admitting: Podiatry

## 2023-08-23 LAB — POCT INFLUENZA A/B

## 2023-08-23 LAB — POC COVID19 BINAXNOW

## 2023-08-23 NOTE — Assessment & Plan Note (Signed)
 Acute onset of sore throat, dry cough, rhinorrhea, and myalgia starting yesterday. COVID-19, influenza, and strep tests are negative. Physical exam shows no bacterial infection or pneumonia. Likely viral etiology. Discussed natural course of viral infections, potential progression to bronchitis or pneumonia, and importance of symptom monitoring and hydration. Explained that 95% of bronchitis cases are viral and resolve within weeks. Discussed potential use of inhalers and prednisone  if symptoms worsen, with caution due to diabetes. - Encourage fluid intake - Recommend steam inhalation - Advise over-the-counter medications such as Claritin or Zyrtec - Avoid medications with DM - Use plain Mucinex  expectorant - Consider liquid Tylenol  for throat pain - Monitor symptoms and call if condition worsens - Follow up if symptoms persist or worsen by next week

## 2023-08-26 ENCOUNTER — Encounter (HOSPITAL_COMMUNITY): Payer: Self-pay | Admitting: Emergency Medicine

## 2023-08-26 ENCOUNTER — Emergency Department (HOSPITAL_COMMUNITY): Payer: PPO

## 2023-08-26 ENCOUNTER — Inpatient Hospital Stay (HOSPITAL_COMMUNITY)
Admission: EM | Admit: 2023-08-26 | Discharge: 2023-08-29 | DRG: 291 | Disposition: A | Discharge status: Home / Self Care | Payer: PPO | Attending: Internal Medicine | Admitting: Internal Medicine

## 2023-08-26 DIAGNOSIS — R931 Abnormal findings on diagnostic imaging of heart and coronary circulation: Secondary | ICD-10-CM | POA: Diagnosis not present

## 2023-08-26 DIAGNOSIS — Z6837 Body mass index (BMI) 37.0-37.9, adult: Secondary | ICD-10-CM

## 2023-08-26 DIAGNOSIS — Z83438 Family history of other disorder of lipoprotein metabolism and other lipidemia: Secondary | ICD-10-CM

## 2023-08-26 DIAGNOSIS — B9789 Other viral agents as the cause of diseases classified elsewhere: Secondary | ICD-10-CM | POA: Diagnosis not present

## 2023-08-26 DIAGNOSIS — Z803 Family history of malignant neoplasm of breast: Secondary | ICD-10-CM

## 2023-08-26 DIAGNOSIS — I252 Old myocardial infarction: Secondary | ICD-10-CM

## 2023-08-26 DIAGNOSIS — Z90710 Acquired absence of both cervix and uterus: Secondary | ICD-10-CM

## 2023-08-26 DIAGNOSIS — Z8673 Personal history of transient ischemic attack (TIA), and cerebral infarction without residual deficits: Secondary | ICD-10-CM

## 2023-08-26 DIAGNOSIS — I5042 Chronic combined systolic (congestive) and diastolic (congestive) heart failure: Secondary | ICD-10-CM | POA: Diagnosis not present

## 2023-08-26 DIAGNOSIS — B9729 Other coronavirus as the cause of diseases classified elsewhere: Secondary | ICD-10-CM | POA: Diagnosis present

## 2023-08-26 DIAGNOSIS — Z91013 Allergy to seafood: Secondary | ICD-10-CM

## 2023-08-26 DIAGNOSIS — Z833 Family history of diabetes mellitus: Secondary | ICD-10-CM

## 2023-08-26 DIAGNOSIS — Z7982 Long term (current) use of aspirin: Secondary | ICD-10-CM

## 2023-08-26 DIAGNOSIS — I130 Hypertensive heart and chronic kidney disease with heart failure and stage 1 through stage 4 chronic kidney disease, or unspecified chronic kidney disease: Principal | ICD-10-CM | POA: Diagnosis present

## 2023-08-26 DIAGNOSIS — R0981 Nasal congestion: Secondary | ICD-10-CM | POA: Diagnosis not present

## 2023-08-26 DIAGNOSIS — R059 Cough, unspecified: Secondary | ICD-10-CM | POA: Diagnosis not present

## 2023-08-26 DIAGNOSIS — I509 Heart failure, unspecified: Secondary | ICD-10-CM | POA: Diagnosis not present

## 2023-08-26 DIAGNOSIS — Z89412 Acquired absence of left great toe: Secondary | ICD-10-CM

## 2023-08-26 DIAGNOSIS — I5043 Acute on chronic combined systolic (congestive) and diastolic (congestive) heart failure: Secondary | ICD-10-CM | POA: Diagnosis present

## 2023-08-26 DIAGNOSIS — J069 Acute upper respiratory infection, unspecified: Secondary | ICD-10-CM | POA: Diagnosis present

## 2023-08-26 DIAGNOSIS — I2510 Atherosclerotic heart disease of native coronary artery without angina pectoris: Secondary | ICD-10-CM | POA: Diagnosis present

## 2023-08-26 DIAGNOSIS — Z794 Long term (current) use of insulin: Secondary | ICD-10-CM

## 2023-08-26 DIAGNOSIS — Z79899 Other long term (current) drug therapy: Secondary | ICD-10-CM

## 2023-08-26 DIAGNOSIS — N179 Acute kidney failure, unspecified: Secondary | ICD-10-CM | POA: Diagnosis present

## 2023-08-26 DIAGNOSIS — Z8249 Family history of ischemic heart disease and other diseases of the circulatory system: Secondary | ICD-10-CM

## 2023-08-26 DIAGNOSIS — Z888 Allergy status to other drugs, medicaments and biological substances status: Secondary | ICD-10-CM

## 2023-08-26 DIAGNOSIS — Z91018 Allergy to other foods: Secondary | ICD-10-CM

## 2023-08-26 DIAGNOSIS — J9811 Atelectasis: Secondary | ICD-10-CM | POA: Diagnosis not present

## 2023-08-26 DIAGNOSIS — M1009 Idiopathic gout, multiple sites: Secondary | ICD-10-CM | POA: Diagnosis present

## 2023-08-26 DIAGNOSIS — J4489 Other specified chronic obstructive pulmonary disease: Secondary | ICD-10-CM | POA: Diagnosis present

## 2023-08-26 DIAGNOSIS — I517 Cardiomegaly: Secondary | ICD-10-CM | POA: Diagnosis not present

## 2023-08-26 DIAGNOSIS — Z801 Family history of malignant neoplasm of trachea, bronchus and lung: Secondary | ICD-10-CM

## 2023-08-26 DIAGNOSIS — I110 Hypertensive heart disease with heart failure: Secondary | ICD-10-CM | POA: Diagnosis not present

## 2023-08-26 DIAGNOSIS — R7989 Other specified abnormal findings of blood chemistry: Secondary | ICD-10-CM | POA: Diagnosis not present

## 2023-08-26 DIAGNOSIS — E1022 Type 1 diabetes mellitus with diabetic chronic kidney disease: Secondary | ICD-10-CM | POA: Diagnosis present

## 2023-08-26 DIAGNOSIS — Z8616 Personal history of COVID-19: Secondary | ICD-10-CM

## 2023-08-26 DIAGNOSIS — M069 Rheumatoid arthritis, unspecified: Secondary | ICD-10-CM | POA: Diagnosis present

## 2023-08-26 DIAGNOSIS — I480 Paroxysmal atrial fibrillation: Secondary | ICD-10-CM | POA: Diagnosis present

## 2023-08-26 DIAGNOSIS — Z886 Allergy status to analgesic agent status: Secondary | ICD-10-CM

## 2023-08-26 DIAGNOSIS — Z8674 Personal history of sudden cardiac arrest: Secondary | ICD-10-CM

## 2023-08-26 DIAGNOSIS — Z7902 Long term (current) use of antithrombotics/antiplatelets: Secondary | ICD-10-CM

## 2023-08-26 DIAGNOSIS — Z1152 Encounter for screening for COVID-19: Secondary | ICD-10-CM

## 2023-08-26 DIAGNOSIS — N184 Chronic kidney disease, stage 4 (severe): Secondary | ICD-10-CM | POA: Diagnosis present

## 2023-08-26 DIAGNOSIS — R0602 Shortness of breath: Secondary | ICD-10-CM | POA: Diagnosis present

## 2023-08-26 DIAGNOSIS — Z955 Presence of coronary angioplasty implant and graft: Secondary | ICD-10-CM

## 2023-08-26 DIAGNOSIS — I2489 Other forms of acute ischemic heart disease: Secondary | ICD-10-CM | POA: Diagnosis present

## 2023-08-26 DIAGNOSIS — Z818 Family history of other mental and behavioral disorders: Secondary | ICD-10-CM

## 2023-08-26 DIAGNOSIS — Z87891 Personal history of nicotine dependence: Secondary | ICD-10-CM

## 2023-08-26 DIAGNOSIS — D649 Anemia, unspecified: Secondary | ICD-10-CM | POA: Diagnosis present

## 2023-08-26 DIAGNOSIS — G4733 Obstructive sleep apnea (adult) (pediatric): Secondary | ICD-10-CM | POA: Diagnosis present

## 2023-08-26 DIAGNOSIS — E782 Mixed hyperlipidemia: Secondary | ICD-10-CM | POA: Diagnosis present

## 2023-08-26 DIAGNOSIS — Z89422 Acquired absence of other left toe(s): Secondary | ICD-10-CM

## 2023-08-26 LAB — CBC WITH DIFFERENTIAL/PLATELET
Abs Immature Granulocytes: 0.04 10*3/uL (ref 0.00–0.07)
Basophils Absolute: 0.1 10*3/uL (ref 0.0–0.1)
Basophils Relative: 1 %
Eosinophils Absolute: 0.2 10*3/uL (ref 0.0–0.5)
Eosinophils Relative: 2 %
HCT: 33.8 % — ABNORMAL LOW (ref 36.0–46.0)
Hemoglobin: 10.9 g/dL — ABNORMAL LOW (ref 12.0–15.0)
Immature Granulocytes: 0 %
Lymphocytes Relative: 8 %
Lymphs Abs: 1.1 10*3/uL (ref 0.7–4.0)
MCH: 28.1 pg (ref 26.0–34.0)
MCHC: 32.2 g/dL (ref 30.0–36.0)
MCV: 87.1 fL (ref 80.0–100.0)
Monocytes Absolute: 0.6 10*3/uL (ref 0.1–1.0)
Monocytes Relative: 5 %
Neutro Abs: 11 10*3/uL — ABNORMAL HIGH (ref 1.7–7.7)
Neutrophils Relative %: 84 %
Platelets: 271 10*3/uL (ref 150–400)
RBC: 3.88 MIL/uL (ref 3.87–5.11)
RDW: 14.9 % (ref 11.5–15.5)
WBC: 13 10*3/uL — ABNORMAL HIGH (ref 4.0–10.5)
nRBC: 0 % (ref 0.0–0.2)

## 2023-08-26 LAB — RESP PANEL BY RT-PCR (RSV, FLU A&B, COVID)  RVPGX2
Influenza A by PCR: NEGATIVE
Influenza B by PCR: NEGATIVE
Resp Syncytial Virus by PCR: NEGATIVE
SARS Coronavirus 2 by RT PCR: NEGATIVE

## 2023-08-26 LAB — COMPREHENSIVE METABOLIC PANEL
ALT: 18 U/L (ref 0–44)
AST: 19 U/L (ref 15–41)
Albumin: 3.4 g/dL — ABNORMAL LOW (ref 3.5–5.0)
Alkaline Phosphatase: 189 U/L — ABNORMAL HIGH (ref 38–126)
Anion gap: 12 (ref 5–15)
BUN: 43 mg/dL — ABNORMAL HIGH (ref 6–20)
CO2: 22 mmol/L (ref 22–32)
Calcium: 8.9 mg/dL (ref 8.9–10.3)
Chloride: 102 mmol/L (ref 98–111)
Creatinine, Ser: 1.93 mg/dL — ABNORMAL HIGH (ref 0.44–1.00)
GFR, Estimated: 29 mL/min — ABNORMAL LOW (ref >60)
Glucose, Bld: 257 mg/dL — ABNORMAL HIGH (ref 70–99)
Potassium: 4.4 mmol/L (ref 3.5–5.1)
Sodium: 136 mmol/L (ref 135–145)
Total Bilirubin: 1.2 mg/dL (ref 0.0–1.2)
Total Protein: 8.1 g/dL (ref 6.5–8.1)

## 2023-08-26 LAB — TROPONIN I (HIGH SENSITIVITY): Troponin I (High Sensitivity): 78 ng/L — ABNORMAL HIGH (ref <18)

## 2023-08-26 LAB — BRAIN NATRIURETIC PEPTIDE: B Natriuretic Peptide: 511.3 pg/mL — ABNORMAL HIGH (ref 0.0–100.0)

## 2023-08-26 NOTE — ED Triage Notes (Addendum)
 PT started feeling bad with congestion, cough, fevers, headache last Thursday. Went went to PCP that day and told has bad cold. Negative for flu/covid. Pt is feeling worse today with nausea but no vomiting and SOB. PT has advanced heart failure clinic. PT has been weighing self daily and reports no gain.

## 2023-08-27 ENCOUNTER — Inpatient Hospital Stay (HOSPITAL_COMMUNITY): Payer: PPO

## 2023-08-27 ENCOUNTER — Emergency Department (HOSPITAL_COMMUNITY): Payer: PPO

## 2023-08-27 ENCOUNTER — Encounter (HOSPITAL_COMMUNITY): Payer: Self-pay | Admitting: Internal Medicine

## 2023-08-27 ENCOUNTER — Other Ambulatory Visit: Payer: Self-pay

## 2023-08-27 DIAGNOSIS — R0602 Shortness of breath: Secondary | ICD-10-CM

## 2023-08-27 DIAGNOSIS — Z8249 Family history of ischemic heart disease and other diseases of the circulatory system: Secondary | ICD-10-CM | POA: Diagnosis not present

## 2023-08-27 DIAGNOSIS — J069 Acute upper respiratory infection, unspecified: Secondary | ICD-10-CM | POA: Diagnosis not present

## 2023-08-27 DIAGNOSIS — I5042 Chronic combined systolic (congestive) and diastolic (congestive) heart failure: Secondary | ICD-10-CM | POA: Diagnosis not present

## 2023-08-27 DIAGNOSIS — I2489 Other forms of acute ischemic heart disease: Secondary | ICD-10-CM | POA: Diagnosis not present

## 2023-08-27 DIAGNOSIS — M069 Rheumatoid arthritis, unspecified: Secondary | ICD-10-CM | POA: Diagnosis not present

## 2023-08-27 DIAGNOSIS — Z8616 Personal history of COVID-19: Secondary | ICD-10-CM | POA: Diagnosis not present

## 2023-08-27 DIAGNOSIS — I110 Hypertensive heart disease with heart failure: Secondary | ICD-10-CM | POA: Diagnosis not present

## 2023-08-27 DIAGNOSIS — Z833 Family history of diabetes mellitus: Secondary | ICD-10-CM | POA: Diagnosis not present

## 2023-08-27 DIAGNOSIS — Z6837 Body mass index (BMI) 37.0-37.9, adult: Secondary | ICD-10-CM | POA: Diagnosis not present

## 2023-08-27 DIAGNOSIS — R931 Abnormal findings on diagnostic imaging of heart and coronary circulation: Secondary | ICD-10-CM | POA: Diagnosis not present

## 2023-08-27 DIAGNOSIS — R7989 Other specified abnormal findings of blood chemistry: Secondary | ICD-10-CM | POA: Diagnosis not present

## 2023-08-27 DIAGNOSIS — Z87891 Personal history of nicotine dependence: Secondary | ICD-10-CM | POA: Diagnosis not present

## 2023-08-27 DIAGNOSIS — Z1152 Encounter for screening for COVID-19: Secondary | ICD-10-CM | POA: Diagnosis not present

## 2023-08-27 DIAGNOSIS — N184 Chronic kidney disease, stage 4 (severe): Secondary | ICD-10-CM | POA: Diagnosis not present

## 2023-08-27 DIAGNOSIS — Z79899 Other long term (current) drug therapy: Secondary | ICD-10-CM | POA: Diagnosis not present

## 2023-08-27 DIAGNOSIS — D649 Anemia, unspecified: Secondary | ICD-10-CM | POA: Diagnosis not present

## 2023-08-27 DIAGNOSIS — I517 Cardiomegaly: Secondary | ICD-10-CM | POA: Diagnosis not present

## 2023-08-27 DIAGNOSIS — R059 Cough, unspecified: Secondary | ICD-10-CM | POA: Diagnosis not present

## 2023-08-27 DIAGNOSIS — E1022 Type 1 diabetes mellitus with diabetic chronic kidney disease: Secondary | ICD-10-CM | POA: Diagnosis not present

## 2023-08-27 DIAGNOSIS — I5043 Acute on chronic combined systolic (congestive) and diastolic (congestive) heart failure: Secondary | ICD-10-CM | POA: Diagnosis not present

## 2023-08-27 DIAGNOSIS — J9811 Atelectasis: Secondary | ICD-10-CM | POA: Diagnosis not present

## 2023-08-27 DIAGNOSIS — N179 Acute kidney failure, unspecified: Secondary | ICD-10-CM | POA: Diagnosis not present

## 2023-08-27 DIAGNOSIS — I509 Heart failure, unspecified: Secondary | ICD-10-CM | POA: Diagnosis not present

## 2023-08-27 DIAGNOSIS — B9729 Other coronavirus as the cause of diseases classified elsewhere: Secondary | ICD-10-CM | POA: Diagnosis not present

## 2023-08-27 DIAGNOSIS — I480 Paroxysmal atrial fibrillation: Secondary | ICD-10-CM | POA: Diagnosis not present

## 2023-08-27 DIAGNOSIS — I2510 Atherosclerotic heart disease of native coronary artery without angina pectoris: Secondary | ICD-10-CM | POA: Diagnosis not present

## 2023-08-27 DIAGNOSIS — I130 Hypertensive heart and chronic kidney disease with heart failure and stage 1 through stage 4 chronic kidney disease, or unspecified chronic kidney disease: Secondary | ICD-10-CM | POA: Diagnosis not present

## 2023-08-27 DIAGNOSIS — E782 Mixed hyperlipidemia: Secondary | ICD-10-CM | POA: Diagnosis not present

## 2023-08-27 DIAGNOSIS — Z794 Long term (current) use of insulin: Secondary | ICD-10-CM | POA: Diagnosis not present

## 2023-08-27 DIAGNOSIS — R0981 Nasal congestion: Secondary | ICD-10-CM | POA: Diagnosis not present

## 2023-08-27 DIAGNOSIS — J4489 Other specified chronic obstructive pulmonary disease: Secondary | ICD-10-CM | POA: Diagnosis not present

## 2023-08-27 DIAGNOSIS — Z8674 Personal history of sudden cardiac arrest: Secondary | ICD-10-CM | POA: Diagnosis not present

## 2023-08-27 DIAGNOSIS — B9789 Other viral agents as the cause of diseases classified elsewhere: Secondary | ICD-10-CM | POA: Diagnosis not present

## 2023-08-27 LAB — RESPIRATORY PANEL BY PCR
Adenovirus: NOT DETECTED
Bordetella Parapertussis: NOT DETECTED
Bordetella pertussis: NOT DETECTED
Chlamydophila pneumoniae: NOT DETECTED
Coronavirus 229E: NOT DETECTED
Coronavirus HKU1: NOT DETECTED
Coronavirus NL63: NOT DETECTED
Coronavirus OC43: DETECTED — AB
Influenza A: NOT DETECTED
Influenza B: NOT DETECTED
Metapneumovirus: NOT DETECTED
Mycoplasma pneumoniae: NOT DETECTED
Parainfluenza Virus 1: NOT DETECTED
Parainfluenza Virus 2: NOT DETECTED
Parainfluenza Virus 3: NOT DETECTED
Parainfluenza Virus 4: NOT DETECTED
Respiratory Syncytial Virus: NOT DETECTED
Rhinovirus / Enterovirus: NOT DETECTED

## 2023-08-27 LAB — ECHOCARDIOGRAM COMPLETE
Area-P 1/2: 2.09 cm2
Height: 64 in
S' Lateral: 3.7 cm
Weight: 3520 [oz_av]

## 2023-08-27 LAB — URINALYSIS, W/ REFLEX TO CULTURE (INFECTION SUSPECTED)
Bilirubin Urine: NEGATIVE
Glucose, UA: 50 mg/dL — AB
Ketones, ur: NEGATIVE mg/dL
Leukocytes,Ua: NEGATIVE
Nitrite: NEGATIVE
Protein, ur: >=300 mg/dL — AB
Specific Gravity, Urine: 1.012 (ref 1.005–1.030)
pH: 5 (ref 5.0–8.0)

## 2023-08-27 LAB — BASIC METABOLIC PANEL
Anion gap: 15 (ref 5–15)
BUN: 42 mg/dL — ABNORMAL HIGH (ref 6–20)
CO2: 21 mmol/L — ABNORMAL LOW (ref 22–32)
Calcium: 8.7 mg/dL — ABNORMAL LOW (ref 8.9–10.3)
Chloride: 102 mmol/L (ref 98–111)
Creatinine, Ser: 1.81 mg/dL — ABNORMAL HIGH (ref 0.44–1.00)
GFR, Estimated: 32 mL/min — ABNORMAL LOW (ref >60)
Glucose, Bld: 256 mg/dL — ABNORMAL HIGH (ref 70–99)
Potassium: 4.3 mmol/L (ref 3.5–5.1)
Sodium: 138 mmol/L (ref 135–145)

## 2023-08-27 LAB — CBG MONITORING, ED: Glucose-Capillary: 284 mg/dL — ABNORMAL HIGH (ref 70–99)

## 2023-08-27 LAB — TROPONIN I (HIGH SENSITIVITY)
Troponin I (High Sensitivity): 123 ng/L (ref <18)
Troponin I (High Sensitivity): 114 ng/L (ref <18)

## 2023-08-27 LAB — CK: Total CK: 45 U/L (ref 38–234)

## 2023-08-27 LAB — D-DIMER, QUANTITATIVE: D-Dimer, Quant: 1.52 ug{FEU}/mL — ABNORMAL HIGH (ref 0.00–0.50)

## 2023-08-27 LAB — GLUCOSE, CAPILLARY
Glucose-Capillary: 132 mg/dL — ABNORMAL HIGH (ref 70–99)
Glucose-Capillary: 106 mg/dL — ABNORMAL HIGH (ref 70–99)

## 2023-08-27 LAB — PROCALCITONIN: Procalcitonin: 0.19 ng/mL

## 2023-08-27 MED ORDER — ALLOPURINOL 100 MG PO TABS
50.0000 mg | ORAL_TABLET | Freq: Every day | ORAL | Status: DC
  Administered 2023-08-27 – 2023-08-29 (×3): 50 mg via ORAL
  Filled 2023-08-27 (×3): qty 1

## 2023-08-27 MED ORDER — INSULIN GLARGINE-YFGN 100 UNIT/ML ~~LOC~~ SOLN
30.0000 [IU] | Freq: Every day | SUBCUTANEOUS | Status: DC
  Administered 2023-08-27 – 2023-08-29 (×3): 30 [IU] via SUBCUTANEOUS
  Filled 2023-08-27 (×3): qty 0.3

## 2023-08-27 MED ORDER — PERFLUTREN LIPID MICROSPHERE
1.0000 mL | INTRAVENOUS | Status: AC | PRN
  Administered 2023-08-27: 2 mL via INTRAVENOUS

## 2023-08-27 MED ORDER — HYDROCODONE-ACETAMINOPHEN 7.5-325 MG PO TABS
1.0000 | ORAL_TABLET | Freq: Three times a day (TID) | ORAL | Status: DC | PRN
  Administered 2023-08-27 – 2023-08-28 (×3): 1 via ORAL
  Filled 2023-08-27 (×3): qty 1

## 2023-08-27 MED ORDER — INSULIN ASPART 100 UNIT/ML IJ SOLN: 0.0000 [IU] | Freq: Every day | INTRAMUSCULAR | Status: DC

## 2023-08-27 MED ORDER — CARVEDILOL 6.25 MG PO TABS
9.3750 mg | ORAL_TABLET | Freq: Two times a day (BID) | ORAL | Status: DC
  Administered 2023-08-27 – 2023-08-29 (×5): 9.375 mg via ORAL
  Filled 2023-08-27 (×3): qty 1
  Filled 2023-08-27: qty 3
  Filled 2023-08-27: qty 1

## 2023-08-27 MED ORDER — HYDRALAZINE HCL 20 MG/ML IJ SOLN: 5.0000 mg | INTRAMUSCULAR | Status: DC | PRN

## 2023-08-27 MED ORDER — LATANOPROST 0.005 % OP SOLN
1.0000 [drp] | Freq: Every day | OPHTHALMIC | Status: DC
  Administered 2023-08-27 – 2023-08-28 (×2): 1 [drp] via OPHTHALMIC
  Filled 2023-08-27: qty 2.5

## 2023-08-27 MED ORDER — ONDANSETRON HCL 4 MG/2ML IJ SOLN: 4.0000 mg | Freq: Four times a day (QID) | INTRAMUSCULAR | Status: DC | PRN

## 2023-08-27 MED ORDER — FUROSEMIDE 10 MG/ML IJ SOLN
40.0000 mg | Freq: Two times a day (BID) | INTRAMUSCULAR | Status: DC
  Administered 2023-08-27 – 2023-08-28 (×3): 40 mg via INTRAVENOUS
  Filled 2023-08-27 (×3): qty 4

## 2023-08-27 MED ORDER — EZETIMIBE 10 MG PO TABS
10.0000 mg | ORAL_TABLET | Freq: Every evening | ORAL | Status: DC
  Administered 2023-08-27 – 2023-08-28 (×2): 10 mg via ORAL
  Filled 2023-08-27 (×2): qty 1

## 2023-08-27 MED ORDER — FENTANYL CITRATE PF 50 MCG/ML IJ SOSY
50.0000 ug | PREFILLED_SYRINGE | Freq: Once | INTRAMUSCULAR | Status: AC
  Administered 2023-08-27: 50 ug via INTRAVENOUS
  Filled 2023-08-27: qty 1

## 2023-08-27 MED ORDER — MAGNESIUM OXIDE -MG SUPPLEMENT 400 (240 MG) MG PO TABS
800.0000 mg | ORAL_TABLET | Freq: Every day | ORAL | Status: DC
  Administered 2023-08-27 – 2023-08-29 (×3): 800 mg via ORAL
  Filled 2023-08-27 (×3): qty 2

## 2023-08-27 MED ORDER — ENOXAPARIN SODIUM 40 MG/0.4ML IJ SOSY
40.0000 mg | PREFILLED_SYRINGE | INTRAMUSCULAR | Status: DC
  Administered 2023-08-27 – 2023-08-28 (×2): 40 mg via SUBCUTANEOUS
  Filled 2023-08-27 (×2): qty 0.4

## 2023-08-27 MED ORDER — FUROSEMIDE 10 MG/ML IJ SOLN
20.0000 mg | Freq: Once | INTRAMUSCULAR | Status: AC
  Administered 2023-08-27: 20 mg via INTRAVENOUS
  Filled 2023-08-27: qty 2

## 2023-08-27 MED ORDER — ASPIRIN 81 MG PO TBEC
81.0000 mg | DELAYED_RELEASE_TABLET | Freq: Every day | ORAL | Status: DC
Start: 2023-08-27 — End: 2023-08-29
  Administered 2023-08-27 – 2023-08-29 (×3): 81 mg via ORAL
  Filled 2023-08-27 (×3): qty 1

## 2023-08-27 MED ORDER — TECHNETIUM TO 99M ALBUMIN AGGREGATED
4.0000 | Freq: Once | INTRAVENOUS | Status: AC | PRN
Start: 2023-08-27 — End: 2023-08-27
  Administered 2023-08-27: 4 via INTRAVENOUS

## 2023-08-27 MED ORDER — HEPARIN (PORCINE) 25000 UT/250ML-% IV SOLN: 1400.0000 [IU]/h | INTRAVENOUS | Status: DC

## 2023-08-27 MED ORDER — BENZONATATE 100 MG PO CAPS
100.0000 mg | ORAL_CAPSULE | Freq: Once | ORAL | Status: AC
  Administered 2023-08-27: 100 mg via ORAL
  Filled 2023-08-27: qty 1

## 2023-08-27 MED ORDER — INSULIN ASPART 100 UNIT/ML IJ SOLN
0.0000 [IU] | Freq: Three times a day (TID) | INTRAMUSCULAR | Status: DC
  Administered 2023-08-27: 2 [IU] via SUBCUTANEOUS
  Administered 2023-08-27: 8 [IU] via SUBCUTANEOUS
  Administered 2023-08-28: 5 [IU] via SUBCUTANEOUS

## 2023-08-27 MED ORDER — ACETAMINOPHEN 325 MG PO TABS: 650.0000 mg | ORAL_TABLET | Freq: Four times a day (QID) | ORAL | Status: DC | PRN

## 2023-08-27 MED ORDER — BENZONATATE 100 MG PO CAPS
100.0000 mg | ORAL_CAPSULE | Freq: Three times a day (TID) | ORAL | Status: DC | PRN
  Administered 2023-08-27 – 2023-08-28 (×2): 100 mg via ORAL
  Filled 2023-08-27 (×2): qty 1

## 2023-08-27 MED ORDER — TICAGRELOR 90 MG PO TABS
90.0000 mg | ORAL_TABLET | Freq: Two times a day (BID) | ORAL | Status: DC
Start: 2023-08-27 — End: 2023-08-29
  Administered 2023-08-27 – 2023-08-29 (×5): 90 mg via ORAL
  Filled 2023-08-27 (×5): qty 1

## 2023-08-27 MED ORDER — ONDANSETRON HCL 4 MG/2ML IJ SOLN
4.0000 mg | Freq: Once | INTRAMUSCULAR | Status: AC
  Administered 2023-08-27: 4 mg via INTRAVENOUS
  Filled 2023-08-27: qty 2

## 2023-08-27 MED ORDER — GABAPENTIN 100 MG PO CAPS
100.0000 mg | ORAL_CAPSULE | Freq: Two times a day (BID) | ORAL | Status: DC
  Administered 2023-08-27 – 2023-08-29 (×5): 100 mg via ORAL
  Filled 2023-08-27 (×5): qty 1

## 2023-08-27 MED ORDER — HEPARIN BOLUS VIA INFUSION
4500.0000 [IU] | Freq: Once | INTRAVENOUS | Status: DC
  Filled 2023-08-27: qty 4500

## 2023-08-27 MED ORDER — SODIUM CHLORIDE 0.9 % IV SOLN
2.0000 g | Freq: Once | INTRAVENOUS | Status: AC
  Administered 2023-08-27: 2 g via INTRAVENOUS
  Filled 2023-08-27: qty 20

## 2023-08-27 MED ORDER — ACETAMINOPHEN 650 MG RE SUPP: 650.0000 mg | Freq: Four times a day (QID) | RECTAL | Status: DC | PRN

## 2023-08-27 MED ORDER — PANTOPRAZOLE SODIUM 40 MG PO TBEC
40.0000 mg | DELAYED_RELEASE_TABLET | Freq: Every day | ORAL | Status: DC
  Administered 2023-08-27 – 2023-08-29 (×3): 40 mg via ORAL
  Filled 2023-08-27 (×3): qty 1

## 2023-08-27 MED ORDER — ONDANSETRON HCL 4 MG PO TABS: 4.0000 mg | ORAL_TABLET | Freq: Four times a day (QID) | ORAL | Status: DC | PRN

## 2023-08-27 MED ORDER — ISOSORBIDE MONONITRATE ER 30 MG PO TB24
30.0000 mg | ORAL_TABLET | Freq: Every day | ORAL | Status: DC
  Administered 2023-08-27 – 2023-08-29 (×3): 30 mg via ORAL
  Filled 2023-08-27 (×3): qty 1

## 2023-08-27 MED ORDER — BUSPIRONE HCL 10 MG PO TABS
10.0000 mg | ORAL_TABLET | Freq: Three times a day (TID) | ORAL | Status: DC
  Administered 2023-08-27 – 2023-08-29 (×7): 10 mg via ORAL
  Filled 2023-08-27 (×7): qty 1

## 2023-08-27 MED ORDER — LORAZEPAM 0.5 MG PO TABS
0.5000 mg | ORAL_TABLET | Freq: Every day | ORAL | Status: DC | PRN
  Administered 2023-08-27: 0.5 mg via ORAL
  Filled 2023-08-27: qty 1

## 2023-08-27 NOTE — Progress Notes (Signed)
PHARMACY - ANTICOAGULATION CONSULT NOTE  Pharmacy Consult for heparin Indication: pulmonary embolus  Allergies  Allergen Reactions   Naproxen Hives and Rash   Shellfish-Derived Products Hives, Swelling and Other (See Comments)    Angioedema    Strawberry (Diagnostic) Hives   Celecoxib Other (See Comments)    Stomach pain   Glucosamine Hives, Swelling and Other (See Comments)    Angioedema    Iron Hives and Other (See Comments)    IV iron transfusion - hives - Feb 2022 hospitalization  Patient said she CAN tolerate if given Benadryl first   Metoclopramide Other (See Comments)    Facial twitching and stuttering   Metolazone Other (See Comments)    Acute renal failure   Statins Other (See Comments)    Muscle pain    Patient Measurements: Height: 5\' 4"  (162.6 cm) Weight: 99.8 kg (220 lb) IBW/kg (Calculated) : 54.7 Heparin Dosing Weight: 78 Kg  Vital Signs: Temp: 98.9 F (37.2 C) (02/11 0505) Temp Source: Oral (02/11 0505) BP: 181/74 (02/11 0715) Pulse Rate: 87 (02/11 0715)  Labs: Recent Labs    08/26/23 2233 08/27/23 0049 08/27/23 0330 08/27/23 0338  HGB 10.9*  --   --   --   HCT 33.8*  --   --   --   PLT 271  --   --   --   CREATININE 1.93*  --   --   --   CKTOTAL  --   --   --  45  TROPONINIHS 78* 123* 114*  --     Estimated Creatinine Clearance: 35.6 mL/min (A) (by C-G formula based on SCr of 1.93 mg/dL (H)).   Medical History: Past Medical History:  Diagnosis Date   Acquired absence of left great toe (HCC)    Acquired absence of other left toe(s) (HCC)    ACS (acute coronary syndrome) (HCC)    Anemia    Anoxic brain injury (HCC)    Anxiety    CAD (coronary artery disease)    DES mLAD 04/07/15; DES dRCA & DES pPDA 08/30/16; DES mCX & PTCA OM1 09/29/20   Chronic diastolic (congestive) heart failure (HCC)    Chronic pain    Chronic systolic (congestive) heart failure (HCC)    CKD (chronic kidney disease)    Contrast dye induced nephropathy,  possible 09/03/2020   COPD (chronic obstructive pulmonary disease) (HCC)    COVID-19 virus infection 03/03/2023   Diabetes (HCC)type 1    Diastolic CHF (HCC)    Dyspnea    Family history of breast cancer 10/23/2021   Gastro-esophageal reflux disease without esophagitis    Hyperlipidemia    Hypertension    Hypertensive heart disease with heart failure (HCC)    Hypoxemia    Idiopathic gout, multiple sites    Leukocytosis 03/29/2022   Localized edema    Low back pain    Mitral regurgitation    Mitral regurgitation    Mixed hyperlipidemia    Mixed incontinence    Morbid (severe) obesity due to excess calories (HCC)    Neuromuscular disorder (HCC)    Neuropathy    Non-ST elevation (NSTEMI) myocardial infarction (HCC)    08/29/20, 09/29/20   OSA (obstructive sleep apnea) 09/03/2020   Other specified hypothyroidism    PAF (paroxysmal atrial fibrillation) (HCC)    s/p pulmonary vein isolation by cryoablation 10/04/15 Dr. Rudolpho Sevin [facility]   PEA (Pulseless electrical activity) [facility])    ~ 01/13/21 at [facility] during admission DKA, volume  overload, possible CAP, hypoxia s/p ACLS with ROSC ~ 10-15   Pneumonia    Primary insomnia    Rheumatoid arthritis (HCC)    Stroke (HCC)    Thyroid disease    Type 1 diabetes mellitus (HCC)    Vitamin D deficiency      Assessment: 31 yoF with viral syndrome, elevated troponin and D.Dimer suspected of having PE. No oral anticoagulation reported prior to admission. Hgb 10.9, PLT WNL, CrCl ~35 mL/min. Pharmacy consulted to dose heparin infusion.    Goal of Therapy:  Heparin level 0.3-0.7 units/ml Monitor platelets by anticoagulation protocol: Yes   Plan:  Give 4500 units bolus x 1 Start heparin infusion at 1400 units/hr Check anti-Xa level in 6 hours and daily while on heparin Continue to monitor H&H and platelets  Ruben Im, PharmD Clinical Pharmacist 08/27/2023 7:26 AM Please check AMION for all [facility] Pharmacy numbers

## 2023-08-27 NOTE — H&P (Signed)
History and Physical    Dana [name] JJO:[id number] DOB: [birth year] DOA: 08/26/2023  I have briefly reviewed the patient's prior medical records in [facility]  PCP: Blane Ohara, MD  Patient coming from: home  Chief Complaint: Congestion, cough, fever, shortness of breath  HPI: Dana [name] is a 63 y.o. female with medical history significant of diastolic heart failure, diabetes, hypertension, hyperlipidemia, morbid obesity, obstructive sleep apnea, PEA arrest, COPD who presents to the ER with several complaints most concerning to her is cough and shortness of breath.  Patient states her symptoms started almost a week ago with nasal congestion, productive cough, fevers and headaches.  Patient went to her PCP and was told she had a "bad cold".  She was negative for COVID and flu at that time.  Over the last few days she has developed increasing shortness of breath and some chest tightness.  She does wear severity and denies gaining weight but says she has not been eating.  Of note patient was recently hospitalized in December 2024 and underwent left heart catheterization with balloon angioplasty of the LAD.   Review of Systems: As per HPI otherwise 10 point review of systems negative.   Past Medical History:  Diagnosis Date   Acquired absence of left great toe (HCC)    Acquired absence of other left toe(s) (HCC)    ACS (acute coronary syndrome) (HCC)    Anemia    Anoxic brain injury (HCC)    Anxiety    CAD (coronary artery disease)    DES mLAD 04/07/15; DES dRCA & DES pPDA 08/30/16; DES mCX & PTCA OM1 09/29/20   Chronic diastolic (congestive) heart failure (HCC)    Chronic pain    Chronic systolic (congestive) heart failure (HCC)    CKD (chronic kidney disease)    Contrast dye induced nephropathy, possible 09/03/2020   COPD (chronic obstructive pulmonary disease) (HCC)    COVID-19 virus infection 03/03/2023   Diabetes (HCC)type 1    Diastolic CHF (HCC)    Dyspnea     Family history of breast cancer 10/23/2021   Gastro-esophageal reflux disease without esophagitis    Hyperlipidemia    Hypertension    Hypertensive heart disease with heart failure (HCC)    Hypoxemia    Idiopathic gout, multiple sites    Leukocytosis 03/29/2022   Localized edema    Low back pain    Mitral regurgitation    Mitral regurgitation    Mixed hyperlipidemia    Mixed incontinence    Morbid (severe) obesity due to excess calories (HCC)    Neuromuscular disorder (HCC)    Neuropathy    Non-ST elevation (NSTEMI) myocardial infarction (HCC)    08/29/20, 09/29/20   OSA (obstructive sleep apnea) 09/03/2020   Other specified hypothyroidism    PAF (paroxysmal atrial fibrillation) (HCC)    s/p pulmonary vein isolation by cryoablation 10/04/15 Dr. Rudolpho Sevin in [facility]   PEA (Pulseless electrical activity) [facility])    ~ 01/13/21 at [facility] during admission DKA, volume overload, possible CAP, hypoxia s/p ACLS with ROSC ~ 10-15   Pneumonia    Primary insomnia    Rheumatoid arthritis (HCC)    Stroke (HCC)    Thyroid disease    Type 1 diabetes mellitus (HCC)    Vitamin D deficiency     Past Surgical History:  Procedure Laterality Date   ABDOMINAL HYSTERECTOMY     Partial- Still has both ovaries   CARDIOVASCULAR STRESS TEST  08/13/2014  Nuclear; Normal   CARPAL TUNNEL RELEASE     CATARACT EXTRACTION, BILATERAL     CESAREAN SECTION     CHOLECYSTECTOMY     CORONARY ANGIOPLASTY WITH STENT PLACEMENT     3 blockages/ 1 stent   CORONARY BALLOON ANGIOPLASTY N/A 07/18/2023   Procedure: CORONARY BALLOON ANGIOPLASTY;  Surgeon: Kathleene Hazel, MD;  [facility]: [facility] INVASIVE CV LAB;  Service: Cardiovascular;  Laterality: N/A;   CORONARY PRESSURE/FFR STUDY N/A 09/07/2022   Procedure: INTRAVASCULAR PRESSURE WIRE/FFR STUDY;  Surgeon: Swaziland, Peter M, MD;  Location: [facility] INVASIVE CV LAB;  Service: Cardiovascular;  Laterality: N/A;   CORONARY STENT INTERVENTION N/A 09/29/2020   Procedure:  CORONARY STENT INTERVENTION;  Surgeon: Tonny Bollman, MD;  Location: [facility] INVASIVE CV LAB;  Service: Cardiovascular;  Laterality: N/A;  CFX   CORONARY STENT INTERVENTION N/A 09/07/2022   Procedure: CORONARY STENT INTERVENTION;  Surgeon: Swaziland, Peter M, MD;  Location: [facility] INVASIVE CV LAB;  Service: Cardiovascular;  Laterality: N/A;   CORONARY STENT INTERVENTION N/A 02/13/2023   Procedure: CORONARY STENT INTERVENTION;  Surgeon: Kathleene Hazel, MD;  Location: [facility] INVASIVE CV LAB;  Service: Cardiovascular;  Laterality: N/A;  LAD   CORONARY ULTRASOUND/IVUS N/A 02/13/2023   Procedure: Coronary Ultrasound/IVUS;  Surgeon: Kathleene Hazel, MD;  Location: [facility] INVASIVE CV LAB;  Service: Cardiovascular;  Laterality: N/A;   CORONARY/GRAFT ACUTE MI REVASCULARIZATION N/A 12/08/2022   Procedure: Coronary/Graft Acute MI Revascularization;  Surgeon: Marykay Lex, MD;  Location: [facility] INVASIVE CV LAB;  Service: Cardiovascular;  Laterality: N/A;   EYE SURGERY     LEFT HEART CATH AND CORONARY ANGIOGRAPHY N/A 09/29/2020   Procedure: LEFT HEART CATH AND CORONARY ANGIOGRAPHY;  Surgeon: Tonny Bollman, MD;  Location: [facility] INVASIVE CV LAB;  Service: Cardiovascular;  Laterality: N/A;   LEFT HEART CATH AND CORONARY ANGIOGRAPHY N/A 09/07/2022   Procedure: LEFT HEART CATH AND CORONARY ANGIOGRAPHY;  Surgeon: Swaziland, Peter M, MD;  Location: [facility] INVASIVE CV LAB;  Service: Cardiovascular;  Laterality: N/A;   LEFT HEART CATH AND CORONARY ANGIOGRAPHY N/A 12/08/2022   Procedure: LEFT HEART CATH AND CORONARY ANGIOGRAPHY;  Surgeon: Marykay Lex, MD;  Location: [facility] INVASIVE CV LAB;  Service: Cardiovascular;  Laterality: N/A;   LEFT HEART CATH AND CORONARY ANGIOGRAPHY N/A 02/13/2023   Procedure: LEFT HEART CATH AND CORONARY ANGIOGRAPHY;  Surgeon: Kathleene Hazel, MD;  Location: [facility] INVASIVE CV LAB;  Service: Cardiovascular;  Laterality: N/A;   LEFT HEART CATH AND CORONARY ANGIOGRAPHY N/A 07/16/2023   Procedure: LEFT HEART  CATH AND CORONARY ANGIOGRAPHY;  Surgeon: Kathleene Hazel, MD;  Location: [facility] INVASIVE CV LAB;  Service: Cardiovascular;  Laterality: N/A;   RIGHT HEART CATH N/A 04/27/2021   Procedure: RIGHT HEART CATH;  Surgeon: Dolores Patty, MD;  Location: [facility] INVASIVE CV LAB;  Service: Cardiovascular;  Laterality: N/A;   RIGHT/LEFT HEART CATH AND CORONARY ANGIOGRAPHY N/A 01/07/2017   Procedure: Right/Left Heart Cath and Coronary Angiography;  Surgeon: Laurey Morale, MD;  Location: [facility] INVASIVE CV LAB;  Service: Cardiovascular;  Laterality: N/A;   ROTATOR CUFF REPAIR     TEE WITHOUT CARDIOVERSION N/A 04/28/2021   Procedure: TRANSESOPHAGEAL ECHOCARDIOGRAM (TEE);  Surgeon: Dolores Patty, MD;  Location: [facility] ENDOSCOPY;  Service: Cardiovascular;  Laterality: N/A;   TEE WITHOUT CARDIOVERSION N/A 05/18/2021   Procedure: TRANSESOPHAGEAL ECHOCARDIOGRAM (TEE);  Surgeon: Orbie Pyo, MD;  Location: [facility] INVASIVE CV LAB;  Service: Cardiovascular;  Laterality: N/A;   TOE AMPUTATION Left    TRANSCATHETER MITRAL  EDGE TO EDGE REPAIR N/A 05/18/2021   Procedure: MITRAL VALVE REPAIR;  Surgeon: Orbie Pyo, MD;  Location: [facility] INVASIVE CV LAB;  Service: Cardiovascular;  Laterality: N/A;   US ECHOCARDIOGRAPHY  05/2017   Normal     reports that she quit smoking about 41 years ago. Her smoking use included cigarettes. She started smoking about 42 years ago. She has been exposed to tobacco smoke. She has never used smokeless tobacco. She reports that she does not drink alcohol and does not use drugs.  Allergies  Allergen Reactions   Naproxen Hives and Rash   Shellfish-Derived Products Hives, Swelling and Other (See Comments)    Angioedema    Strawberry (Diagnostic) Hives   Celecoxib Other (See Comments)    Stomach pain   Glucosamine Hives, Swelling and Other (See Comments)    Angioedema    Iron Hives and Other (See Comments)    IV iron transfusion - hives - Feb 2022 hospitalization  Patient said  she CAN tolerate if given Benadryl first   Metoclopramide Other (See Comments)    Facial twitching and stuttering   Metolazone Other (See Comments)    Acute renal failure   Statins Other (See Comments)    Muscle pain    Family History  Problem Relation Age of Onset   Breast cancer Mother 76       contralateral breast ca dx 6   Coronary artery disease Mother    Hypertension Mother    Hyperlipidemia Mother    Diabetes type II Mother    Lung cancer Mother 89   Diabetes Father    Hypertension Father    Mental illness Sister    Bipolar disorder Other    Depression Other    Schizophrenia Other    Cancer Other        MGF's sisters x2; unknown type    Prior to Admission medications   Medication Sig Start Date End Date Taking? Authorizing Provider  acetaminophen (TYLENOL) 500 MG tablet Take 1,000 mg by mouth every 6 (six) hours as needed for mild pain (pain score 1-3) or headache.   Yes [provider]  allopurinol (ZYLOPRIM) 100 MG tablet Take 0.5 tablets (50 mg total) by mouth daily. 04/29/23  Yes Cox, Fritzi Mandes, MD  aspirin EC 81 MG tablet Take 1 tablet (81 mg total) by mouth daily. Swallow whole. 08/13/23  Yes Cox, Kirsten, MD  busPIRone (BUSPAR) 10 MG tablet Take 1 tablet (10 mg total) by mouth 3 (three) times daily. 08/14/23  Yes Cox, Kirsten, MD  carvedilol (COREG) 6.25 MG tablet Take 1.5 tablets (9.375 mg total) by mouth 2 (two) times daily. 08/05/23  Yes Alen Bleacher, NP  Continuous Glucose Sensor (FREESTYLE LIBRE 2 SENSOR) MISC Inject 1 device into skin every 14 (fourteen) days. 08/14/23  Yes Cox, Kirsten, MD  EPINEPHrine 0.3 mg/0.3 mL IJ SOAJ injection Use as directed for life-threatening allergic reaction. 04/29/23  Yes Cox, Kirsten, MD  Evolocumab (REPATHA SURECLICK) 140 MG/ML SOAJ Inject 140 mg into the skin every 14 (fourteen) days. 04/29/23  Yes Cox, Kirsten, MD  ezetimibe (ZETIA) 10 MG tablet Take 1 tablet (10 mg total) by mouth every evening. 04/29/23  Yes Cox,  Kirsten, MD  gabapentin (NEURONTIN) 100 MG capsule Take 1 capsule (100 mg total) by mouth 2 (two) times daily. 08/13/23 11/13/23 Yes Sirivol, Mamatha, MD  Glucagon (GVOKE HYPOPEN 2-PACK) 0.5 MG/0.1ML SOAJ Inject 0.5 mg into the skin daily as needed. 07/12/23  Yes CoxFritzi Mandes, MD  insulin aspart (NOVOLOG FLEXPEN) 100 UNIT/ML FlexPen Inject 2 - 10 Units before meals based on SSI. 04/29/23  Yes Cox, Kirsten, MD  insulin glargine (LANTUS SOLOSTAR) 100 UNIT/ML Solostar Pen Inject 60 Units into the skin daily. Patient taking differently: Inject 33-35 Units into the skin daily. 05/01/23  Yes   Insulin Pen Needle (BD PEN NEEDLE NANO 2ND GEN) 32G X 4 MM MISC Inject 1 each into the skin in the morning, at noon, in the evening, and at bedtime. 08/30/21  Yes Cox, Kirsten, MD  isosorbide mononitrate (IMDUR) 30 MG 24 hr tablet Take 1 tablet (30 mg total) by mouth daily. 08/05/23  Yes Alen Bleacher, NP  latanoprost (XALATAN) 0.005 % ophthalmic solution Place 1 drop into both eyes at bedtime. 08/14/23  Yes Cox, Kirsten, MD  linaclotide [facility]) 145 MCG CAPS capsule Take 1 capsule (145 mcg total) by mouth daily before breakfast. Patient taking differently: Take 145 mcg by mouth daily as needed (for constipation). 04/29/23  Yes Cox, Kirsten, MD  LORazepam (ATIVAN) 0.5 MG tablet Take 1 tablet (0.5 mg total) by mouth daily as needed for anxiety. 04/29/23  Yes Cox, Kirsten, MD  magnesium oxide (MAG-OX) 400 MG tablet Take 2 tablets (800 mg total) by mouth daily. Take 2 (400 mg) daily=800 mg 04/29/23  Yes Cox, Kirsten, MD  Menthol-Methyl Salicylate (ICY HOT EXTRA STRENGTH) 10-30 % CREA Apply 1 Application topically as needed (bilateral hips and knees).   Yes [provider]  nitroGLYCERIN (NITROSTAT) 0.4 MG SL tablet DISSOLVE ONE TABLET UNDER THE TONGUE EVERY 5 MINUTES AS NEEDED FOR CHEST PAIN.  DO NOT EXCEED A TOTAL OF 3 DOSES IN 15 MINUTES 04/29/23  Yes Cox, Kirsten, MD  Omega-3 Fatty Acids (FISH OIL PO) Take 1  capsule by mouth daily.   Yes [provider]  OXYGEN Inhale 2 L/min into the lungs at bedtime.   Yes [provider]  pantoprazole (PROTONIX) 40 MG tablet Take 1 tablet (40 mg total) by mouth daily. Patient taking differently: Take 40 mg by mouth daily before breakfast. 04/29/23  Yes Cox, Kirsten, MD  potassium chloride SA (KLOR-CON M) 20 MEQ tablet Take 2 tablets (40 mEq total) by mouth daily. Patient taking differently: Take 20 mEq by mouth See admin instructions. Take 20 mEq by mouth on Mondays  and Thursdays ONLY 04/29/23  Yes Cox, Kirsten, MD  Probiotic CAPS Take 1 capsule by mouth daily.   Yes [provider]  ticagrelor (BRILINTA) 90 MG TABS tablet Take 1 tablet (90 mg total) by mouth 2 (two) times daily. 04/29/23  Yes Cox, Kirsten, MD  torsemide (DEMADEX) 20 MG tablet Take 3 tablets (60 mg total) by mouth 2 (two) times daily for 3 days, THEN 3 tablets (60 mg total) daily. Patient taking differently:  3 tablets (60 mg total) daily. 07/20/23 01/20/24 Yes Narda Bonds, MD  HYDROcodone-acetaminophen (NORCO) 7.5-325 MG tablet Take 1 tablet by mouth 3 (three) times daily as needed. Patient taking differently: Take 1 tablet by mouth 3 (three) times daily. 08/14/23   CoxFritzi Mandes, MD  promethazine (PHENERGAN) 25 MG tablet Take 1 tablet (25 mg total) by mouth daily as needed. Patient taking differently: Take 25 mg by mouth every 8 (eight) hours as needed for nausea or vomiting. 04/29/23   Blane Ohara, MD    Physical Exam: Vitals:   08/27/23 0715 08/27/23 0730 08/27/23 0800 08/27/23 0827  BP: (!) 181/74 (!) 162/66 (!) 176/72   Pulse: 87 84 83   Resp:  15 (!) 26 18   Temp:    97.9 F (36.6 C)  TempSrc:    Oral  SpO2: 99% 98% 98%   Weight:      Height:          Constitutional: NAD, calm, comfortable- chronically ill appearing Neck: normal, supple, no masses, no thyromegaly Respiratory: diminished at bases, no wheezing, no crackles. Normal respiratory effort. No  accessory muscle use.  Cardiovascular: Regular rate and rhythm, no murmurs / rubs / gallops. No extremity edema. 2+ pedal pulses.  Musculoskeletal: no clubbing / cyanosis. Normal muscle tone.  Skin: no rashes, lesions, ulcers. No induration Neurologic: CN 2-12 grossly intact. Strength 5/5 in all 4.  Psychiatric: Normal judgment and insight. Alert and oriented x 3. Normal mood.   Labs on Admission: I have personally reviewed following labs and imaging studies  CBC: Recent Labs  Lab 08/26/23 2233  WBC 13.0*  NEUTROABS 11.0*  HGB 10.9*  HCT 33.8*  MCV 87.1  PLT 271   Basic Metabolic Panel: Recent Labs  Lab 08/26/23 2233 08/27/23 0744  NA 136 138  K 4.4 4.3  CL 102 102  CO2 22 21*  GLUCOSE 257* 256*  BUN 43* 42*  CREATININE 1.93* 1.81*  CALCIUM 8.9 8.7*   GFR: Estimated Creatinine Clearance: 37.9 mL/min (A) (by C-G formula based on SCr of 1.81 mg/dL (H)). Liver Function Tests: Recent Labs  Lab 08/26/23 2233  AST 19  ALT 18  ALKPHOS 189*  BILITOT 1.2  PROT 8.1  ALBUMIN 3.4*   No results for input(s): "LIPASE", "AMYLASE" in the last 168 hours. No results for input(s): "AMMONIA" in the last 168 hours. Coagulation Profile: No results for input(s): "INR", "PROTIME" in the last 168 hours. Cardiac Enzymes: Recent Labs  Lab 08/27/23 0338  CKTOTAL 45   BNP (last 3 results) No results for input(s): "PROBNP" in the last 8760 hours. HbA1C: No results for input(s): "HGBA1C" in the last 72 hours. CBG: No results for input(s): "GLUCAP" in the last 168 hours. Lipid Profile: No results for input(s): "CHOL", "HDL", "LDLCALC", "TRIG", "CHOLHDL", "LDLDIRECT" in the last 72 hours. Thyroid Function Tests: No results for input(s): "TSH", "T4TOTAL", "FREET4", "T3FREE", "THYROIDAB" in the last 72 hours. Anemia Panel: No results for input(s): "VITAMINB12", "FOLATE", "FERRITIN", "TIBC", "IRON", "RETICCTPCT" in the last 72 hours. Urine analysis:    Component Value Date/Time    COLORURINE YELLOW 08/27/2023 0423   APPEARANCEUR HAZY (A) 08/27/2023 0423   LABSPEC 1.012 08/27/2023 0423   PHURINE 5.0 08/27/2023 0423   GLUCOSEU 50 (A) 08/27/2023 0423   HGBUR SMALL (A) 08/27/2023 0423   BILIRUBINUR NEGATIVE 08/27/2023 0423   BILIRUBINUR negative 09/27/2022 1131   BILIRUBINUR negative 07/11/2021 0812   KETONESUR NEGATIVE 08/27/2023 0423   PROTEINUR >=300 (A) 08/27/2023 0423   UROBILINOGEN 0.2 09/27/2022 1131   NITRITE NEGATIVE 08/27/2023 0423   LEUKOCYTESUR NEGATIVE 08/27/2023 0423     Radiological Exams on Admission: CT CHEST WO CONTRAST Result Date: 08/27/2023 CLINICAL DATA:  Respiratory illness with nondiagnostic x-ray. EXAM: CT CHEST WITHOUT CONTRAST TECHNIQUE: Multidetector CT imaging of the chest was performed following the standard protocol without IV contrast. RADIATION DOSE REDUCTION: This exam was performed according to the departmental dose-optimization program which includes automated exposure control, adjustment of the mA and/or kV according to patient size and/or use of iterative reconstruction technique. COMPARISON:  07/13/2023 FINDINGS: Cardiovascular: Cardiac enlargement. No pericardial effusion. Extensive atheromatous calcification of the coronaries. There is a chronic foreign body in the left lower lobe  pulmonary arteries. Mediastinum/Nodes: Generous sized mediastinal lymph nodes remain generalized and size stable, pulmonary findings suggesting congestion. Lungs/Pleura: Generalized airway thickening with interlobular septal thickening to a mild degree. No consolidation. Mild lingular atelectasis. Upper Abdomen: Extensive arterial calcification. Musculoskeletal: Ordinary thoracic spondylosis. IMPRESSION: Cardiomegaly and interstitial edema.  No focal pneumonia. Electronically Signed   By: Tiburcio Pea M.D.   On: 08/27/2023 06:57   DG Chest 2 View Result Date: 08/26/2023 CLINICAL DATA:  Nasal congestion and cough. EXAM: CHEST - 2 VIEW COMPARISON:  July 13, 2023 FINDINGS: The cardiac silhouette is mildly enlarged and unchanged in size. Mild atelectatic changes are suspected within the left lung base. No pleural effusion or pneumothorax is identified. The visualized skeletal structures are unremarkable. IMPRESSION: Stable cardiomegaly with left basilar atelectasis. Electronically Signed   By: Aram Candela M.D.   On: 08/26/2023 23:12      Assessment/Plan Principal Problem:   SOB (shortness of breath)   Viral illness - COVID/flu/RSV negative NP swab Supportive care -Trend procalcitonin and consider adding antibiotics if up trending  Acute on chronic HFpEF Patient managed with Lasix IV -If patient becomes difficult to diurese we will get cardiology consult as she follows with Dr. Gala Romney -Strict I's and O's - Daily weights - Echo (last echo was December 2024)  Diabetes mellitus Unclear if type 1 or 2 from chart review, but it appears she is insulin dependent regardless.  -Resume sliding scale and long-acting  AKI on CKD stage IV Patient follows with nephrology as an outpatient.  History of CIN.  -Trending down with diuresis -2.0-2.5 per cardiology  chronic anemia -Trend hemoglobin   Acute on chronic respiratory failure with hypoxia -Wears O2 at night - Weaned to room air -VQ scan negative for PE so will DC heparin   CAD - Recent stent - Resume aspirin and Brilinta - Coreg    DVT prophylaxis: lovenox  Code Status: full  Family Communication: none at bedside  Consults called: none    Admission status: inpt       Joseph Art [facility]   How to contact the [facility] Attending or Consulting provider 7A - 7P or covering provider during after hours 7P -7A, for this patient?  Check the care team in [facility] and look for a) attending/consulting [facility] provider listed and b) the [facility] team listed Log into [email or web address] and use [facility] universal password to access. If you do not have the password, please  contact the hospital operator. Locate the [facility] provider you are looking for under [facility] and page to a number that you can be directly reached. If you still have difficulty reaching the provider, please page the [facility] (Director on Call) for the Hospitalists listed on amion for assistance.  08/27/2023, 8:31 AM

## 2023-08-27 NOTE — ED Notes (Signed)
Pt tx to VQ

## 2023-08-27 NOTE — Progress Notes (Signed)
Heart Failure Navigator Progress Note  Assessed for Heart & Vascular TOC clinic readiness.  Patient does not meet criteria due to Advanced heart Failure Team patient of Dr. Gala Romney. .   Navigator will sign off at this time.    Rhae Hammock, BSN, Scientist, clinical (histocompatibility and immunogenetics) Only

## 2023-08-27 NOTE — ED Provider Notes (Addendum)
[place] [facility] AT [facility] Provider Note   CSN: [id number] Arrival date & time: 08/26/23  2007     History  Chief Complaint  Patient presents with   Nasal Congestion   Cough    Dana [name] is a 63 y.o. female.   Cough Associated symptoms: chest pain, chills, fever and shortness of breath      63 year old female with medical history significant for diastolic CHF, type 1 diabetes, HTN, HLD, CAD, morbid obesity, obstructive sleep apnea on CPAP, on oxygen via nasal cannula at night, CKD, previous PEA arrest and anoxic brain injury, COPD, CVA who presents to the emergency department with a viral syndrome.  The patient states that she has had nasal congestion, productive cough, fevers and headache since this past Thursday.  She went to her PCP outpatient and was told she had an upper respiratory infection.  She had been negative for COVID and flu at that time.  She is feeling increasing shortness of breath and endorses some pleuritic chest discomfort with associated shortness of breath.  She endorses difficulty lying flat.  She denies any significant lower extremity swelling or weight gain.  She endorses nausea, denies any abdominal discomfort and denies any vomiting.  She follows outpatient with the advanced heart failure clinic.  She is on Brilinta and last underwent left heart catheterization and coronary geography during admission in December 24 - January 2025 during which point she underwent balloon angioplasty of her LAD.  Home Medications Prior to Admission medications   Medication Sig Start Date End Date Taking? Authorizing Provider  allopurinol (ZYLOPRIM) 100 MG tablet Take 0.5 tablets (50 mg total) by mouth daily. 04/29/23   Blane Ohara, MD  aspirin EC 81 MG tablet Take 1 tablet (81 mg total) by mouth daily. Swallow whole. 08/13/23   Cox, Fritzi Mandes, MD  busPIRone (BUSPAR) 10 MG tablet Take 1 tablet (10 mg total) by mouth 3 (three) times daily. 08/14/23    Cox, Fritzi Mandes, MD  carvedilol (COREG) 6.25 MG tablet Take 1.5 tablets (9.375 mg total) by mouth 2 (two) times daily. 08/05/23   Alen Bleacher, NP  Continuous Glucose Sensor (FREESTYLE LIBRE 2 SENSOR) MISC Inject 1 device into skin every 14 (fourteen) days. 08/14/23   Cox, Fritzi Mandes, MD  CRANBERRY PO Take 2 tablets by mouth daily.    [provider]  EPINEPHrine 0.3 mg/0.3 mL IJ SOAJ injection Use as directed for life-threatening allergic reaction. 04/29/23   Cox, Kirsten, MD  Evolocumab (REPATHA SURECLICK) 140 MG/ML SOAJ Inject 140 mg into the skin every 14 (fourteen) days. 04/29/23   Cox, Fritzi Mandes, MD  ezetimibe (ZETIA) 10 MG tablet Take 1 tablet (10 mg total) by mouth every evening. 04/29/23   CoxFritzi Mandes, MD  gabapentin (NEURONTIN) 100 MG capsule Take 1 capsule (100 mg total) by mouth 2 (two) times daily. 08/13/23 11/13/23  Sirivol, Kristie Cowman, MD  Glucagon (GVOKE HYPOPEN 2-PACK) 0.5 MG/0.1ML SOAJ Inject 0.5 mg into the skin daily as needed. 07/12/23   Cox, Fritzi Mandes, MD  HYDROcodone-acetaminophen (NORCO) 7.5-325 MG tablet Take 1 tablet by mouth 3 (three) times daily as needed. 08/14/23   Cox, Fritzi Mandes, MD  insulin aspart (NOVOLOG FLEXPEN) 100 UNIT/ML FlexPen Inject 2 - 10 Units before meals based on SSI. Patient taking differently: Inject 2-10 Units into the skin See admin instructions. Inject 2-10 units into the skin before meals, based on SSI 04/29/23   Cox, Kirsten, MD  insulin glargine (LANTUS SOLOSTAR) 100 UNIT/ML Solostar Pen Inject 60  Units into the skin daily. Patient taking differently: Inject 40 Units into the skin daily. 05/01/23     insulin glargine (LANTUS SOLOSTAR) 100 UNIT/ML Solostar Pen Inject 40 Units into the skin daily. 07/20/23   Narda Bonds, MD  Insulin Pen Needle (BD PEN NEEDLE NANO 2ND GEN) 32G X 4 MM MISC Inject 1 each into the skin in the morning, at noon, in the evening, and at bedtime. 08/30/21   CoxFritzi Mandes, MD  isosorbide mononitrate (IMDUR) 30 MG 24 hr tablet Take 1  tablet (30 mg total) by mouth daily. 08/05/23   Alen Bleacher, NP  latanoprost (XALATAN) 0.005 % ophthalmic solution Place 1 drop into both eyes at bedtime. 08/14/23   CoxFritzi Mandes, MD  linaclotide [facility]) 145 MCG CAPS capsule Take 1 capsule (145 mcg total) by mouth daily before breakfast. Patient taking differently: Take 145 mcg by mouth daily as needed (for constipation). 04/29/23   Cox, Fritzi Mandes, MD  LORazepam (ATIVAN) 0.5 MG tablet Take 1 tablet (0.5 mg total) by mouth daily as needed for anxiety. 04/29/23   CoxFritzi Mandes, MD  magnesium oxide (MAG-OX) 400 MG tablet Take 2 tablets (800 mg total) by mouth daily. Take 2 (400 mg) daily=800 mg Patient taking differently: Take 800 mg by mouth daily. 04/29/23   Cox, Fritzi Mandes, MD  nitroGLYCERIN (NITROSTAT) 0.4 MG SL tablet DISSOLVE ONE TABLET UNDER THE TONGUE EVERY 5 MINUTES AS NEEDED FOR CHEST PAIN.  DO NOT EXCEED A TOTAL OF 3 DOSES IN 15 MINUTES Patient taking differently: DISSOLVE ONE TABLET UNDER THE TONGUE EVERY 5 MINUTES AS NEEDED FOR CHEST PAIN.  DO NOT EXCEED A TOTAL OF 3 DOSES IN 15 MINUTES 04/29/23   Cox, Kirsten, MD  Omega-3 Fatty Acids (FISH OIL PO) Take 1 capsule by mouth daily.    [provider]  OXYGEN Inhale 2 L/min into the lungs at bedtime.    [provider]  pantoprazole (PROTONIX) 40 MG tablet Take 1 tablet (40 mg total) by mouth daily. Patient taking differently: Take 40 mg by mouth daily before breakfast. 04/29/23   Cox, Kirsten, MD  potassium chloride SA (KLOR-CON M) 20 MEQ tablet Take 2 tablets (40 mEq total) by mouth daily. Patient taking differently: Take 20 mEq by mouth See admin instructions. Take 20 mEq by mouth on Mondays  and Thursdays ONLY 04/29/23   CoxFritzi Mandes, MD  PRESCRIPTION MEDICATION See admin instructions. CPAP- At bedtime    [provider]  Probiotic CAPS Take 1 capsule by mouth daily.    [provider]  promethazine (PHENERGAN) 25 MG tablet Take 1 tablet (25 mg total) by  mouth daily as needed. 04/29/23   CoxFritzi Mandes, MD  ticagrelor (BRILINTA) 90 MG TABS tablet Take 1 tablet (90 mg total) by mouth 2 (two) times daily. 04/29/23   Cox, Fritzi Mandes, MD  torsemide (DEMADEX) 20 MG tablet Take 3 tablets (60 mg total) by mouth 2 (two) times daily for 3 days, THEN 3 tablets (60 mg total) daily. Patient taking differently:  3 tablets (60 mg total) daily. 07/20/23 08/22/23  Narda Bonds, MD      Allergies    Naproxen, Shellfish-derived products, Strawberry (diagnostic), Celecoxib, Glucosamine, Iron, Metoclopramide, Metolazone, and Statins    Review of Systems   Review of Systems  Constitutional:  Positive for chills and fever.  Respiratory:  Positive for cough and shortness of breath.   Cardiovascular:  Positive for chest pain.  All other systems reviewed and are negative.   Physical  Exam Updated Vital Signs BP (!) 170/71   Pulse 86   Temp 98.9 F (37.2 C) (Oral)   Resp 18   Ht 5\' 4"  (1.626 m)   Wt 99.8 kg   SpO2 99%   BMI 37.76 kg/m  Physical Exam Vitals and nursing note reviewed.  Constitutional:      General: She is not in acute distress.    Appearance: She is well-developed. She is obese.  HENT:     Head: Normocephalic and atraumatic.  Eyes:     Conjunctiva/sclera: Conjunctivae normal.  Cardiovascular:     Rate and Rhythm: Normal rate and regular rhythm.  Pulmonary:     Effort: Pulmonary effort is normal. No respiratory distress.     Breath sounds: Normal breath sounds.  Abdominal:     Palpations: Abdomen is soft.     Tenderness: There is no abdominal tenderness.  Musculoskeletal:        General: No swelling.     Cervical back: Neck supple.     Right lower leg: No edema.     Left lower leg: No edema.  Skin:    General: Skin is warm and dry.     Capillary Refill: Capillary refill takes less than 2 seconds.  Neurological:     Mental Status: She is alert.  Psychiatric:        Mood and Affect: Mood normal.     ED Results / Procedures /  Treatments   Labs (all labs ordered are listed, but only abnormal results are displayed) Labs Reviewed  COMPREHENSIVE METABOLIC PANEL - Abnormal; Notable for the following components:      Result Value   Glucose, Bld 257 (*)    BUN 43 (*)    Creatinine, Ser 1.93 (*)    Albumin 3.4 (*)    Alkaline Phosphatase 189 (*)    GFR, Estimated 29 (*)    All other components within normal limits  CBC WITH DIFFERENTIAL/PLATELET - Abnormal; Notable for the following components:   WBC 13.0 (*)    Hemoglobin 10.9 (*)    HCT 33.8 (*)    Neutro Abs 11.0 (*)    All other components within normal limits  BRAIN NATRIURETIC PEPTIDE - Abnormal; Notable for the following components:   B Natriuretic Peptide 511.3 (*)    All other components within normal limits  URINALYSIS, W/ REFLEX TO CULTURE (INFECTION SUSPECTED) - Abnormal; Notable for the following components:   APPearance HAZY (*)    Glucose, UA 50 (*)    Hgb urine dipstick SMALL (*)    Protein, ur >=300 (*)    Bacteria, UA MANY (*)    All other components within normal limits  D-DIMER, QUANTITATIVE - Abnormal; Notable for the following components:   D-Dimer, Quant 1.52 (*)    All other components within normal limits  TROPONIN I (HIGH SENSITIVITY) - Abnormal; Notable for the following components:   Troponin I (High Sensitivity) 78 (*)    All other components within normal limits  TROPONIN I (HIGH SENSITIVITY) - Abnormal; Notable for the following components:   Troponin I (High Sensitivity) 123 (*)    All other components within normal limits  TROPONIN I (HIGH SENSITIVITY) - Abnormal; Notable for the following components:   Troponin I (High Sensitivity) 114 (*)    All other components within normal limits  RESP PANEL BY RT-PCR (RSV, FLU A&B, COVID)  RVPGX2  CULTURE, BLOOD (ROUTINE X 2)  CULTURE, BLOOD (ROUTINE X 2)  CK  EKG EKG Interpretation Date/Time:  Monday August 26 2023 22:39:22 EST Ventricular Rate:  88 PR  Interval:  148 QRS Duration:  84 QT Interval:  388 QTC Calculation: 469 R Axis:   86  Text Interpretation: Normal sinus rhythm Nonspecific ST abnormality Abnormal ECG No significant change since last tracing Confirmed by Ernie Avena ([id number]) on 08/27/2023 1:59:35 AM  Radiology CT CHEST WO CONTRAST Result Date: 08/27/2023 CLINICAL DATA:  Respiratory illness with nondiagnostic x-ray. EXAM: CT CHEST WITHOUT CONTRAST TECHNIQUE: Multidetector CT imaging of the chest was performed following the standard protocol without IV contrast. RADIATION DOSE REDUCTION: This exam was performed according to the departmental dose-optimization program which includes automated exposure control, adjustment of the mA and/or kV according to patient size and/or use of iterative reconstruction technique. COMPARISON:  07/13/2023 FINDINGS: Cardiovascular: Cardiac enlargement. No pericardial effusion. Extensive atheromatous calcification of the coronaries. There is a chronic foreign body in the left lower lobe pulmonary arteries. Mediastinum/Nodes: Generous sized mediastinal lymph nodes remain generalized and size stable, pulmonary findings suggesting congestion. Lungs/Pleura: Generalized airway thickening with interlobular septal thickening to a mild degree. No consolidation. Mild lingular atelectasis. Upper Abdomen: Extensive arterial calcification. Musculoskeletal: Ordinary thoracic spondylosis. IMPRESSION: Cardiomegaly and interstitial edema.  No focal pneumonia. Electronically Signed   By: Tiburcio Pea M.D.   On: 08/27/2023 06:57   DG Chest 2 View Result Date: 08/26/2023 CLINICAL DATA:  Nasal congestion and cough. EXAM: CHEST - 2 VIEW COMPARISON:  July 13, 2023 FINDINGS: The cardiac silhouette is mildly enlarged and unchanged in size. Mild atelectatic changes are suspected within the left lung base. No pleural effusion or pneumothorax is identified. The visualized skeletal structures are unremarkable. IMPRESSION: Stable  cardiomegaly with left basilar atelectasis. Electronically Signed   By: Aram Candela M.D.   On: 08/26/2023 23:12    Procedures Procedures    Medications Ordered in ED Medications  fentaNYL (SUBLIMAZE) injection 50 mcg (50 mcg Intravenous Given 08/27/23 0334)  benzonatate (TESSALON) capsule 100 mg (100 mg Oral Given 08/27/23 0334)  furosemide (LASIX) injection 20 mg (20 mg Intravenous Given 08/27/23 0526)  cefTRIAXone (ROCEPHIN) 2 g in sodium chloride 0.9 % 100 mL IVPB (0 g Intravenous Stopped 08/27/23 0645)    ED Course/ Medical Decision Making/ A&P Clinical Course as of 08/27/23 0705  Tue Aug 27, 2023  0158 Troponin I (High Sensitivity)(!!): 123 [JL]  0429 D-Dimer, Quant(!): 1.52 [JL]  0429 Troponin I (High Sensitivity)(!!): 114 [JL]  0455 Bacteria, UA(!): MANY [JL]  0455 WBC, UA: 6-10 [JL]  0455 WBC(!): 13.0 [JL]    Clinical Course User Index [JL] Ernie Avena, MD                                 Medical Decision Making Amount and/or Complexity of Data Reviewed Labs: ordered. Decision-making details documented in ED Course. Radiology: ordered.  Risk Prescription drug management. Decision regarding hospitalization.   63 year old female with medical history significant for diastolic CHF, type 1 diabetes, HTN, HLD, CAD, morbid obesity, obstructive sleep apnea on CPAP, on oxygen via nasal cannula at night, CKD, previous PEA arrest and anoxic brain injury, COPD, CVA who presents to the emergency department with a viral syndrome.  The patient states that she has had nasal congestion, productive cough, fevers and headache since this past Thursday.  She went to her PCP outpatient and was told she had an upper respiratory infection.  She had been negative for COVID and  flu at that time.  She is feeling increasing shortness of breath and endorses some pleuritic chest discomfort with associated shortness of breath.  She endorses difficulty lying flat.  She denies any significant  lower extremity swelling or weight gain.  She endorses nausea, denies any abdominal discomfort and denies any vomiting.  She follows outpatient with the advanced heart failure clinic.  She is on Brilinta and last underwent left heart catheterization and coronary geography during admission in December 24 - January 2025 during which point she underwent balloon angioplasty of her LAD.  On arrival, the patient was afebrile, not tachycardic heart rate 95, sinus rhythm noted on cardiac telemetry, not tachypneic RR 16, BP hypertensive 197/74, saturating 93% on room air, patient subsequently placed on her home O2 via nasal cannula given her nightly oxygen use.  Patient presenting with cough and shortness of breath, concern for viral syndrome, bacterial pneumonia, concern for PE, no sharp chest pressure, low concern for ACS, patient denies any urinary symptoms at this time, denies any abdominal pain.  Initial laboratory evaluation was concerning for an elevated cardiac troponin elevated D-dimer, downtrending 1 23-1 14, D-dimer elevated to 1.52, COVID-19, influenza, RSV PCR testing resulted negative, BNP was moderately elevated to 511 above the patient's baseline, CBC revealed a nonspecific leukocytosis of 13, mild anemia to 10.9, CMP with hyperglycemia of 257, serum creatinine at baseline for the patient CKD at 1.93 urinalysis with evidence of UTI.  A chest x-ray was performed which revealed no acute cardiac or pulmonary abnormality, cardiomegaly with left basilar atelectasis.  Given the patient's respiratory symptoms noncontrasted CT was ordered to further evaluate for potential commune acquired pneumonia.  Considered PE however unable to obtain in the setting of the patient's poor renal function with a GFR of 29, VQ scan was ordered.  Considered worsening CHF with difficulty lying flat and worsening respiratory symptoms and a mildly elevated BNP, patient administered 20 mg of IV Lasix.  Patient with many bacteria and  6-10 WBCs on urinalysis, administered 2 g of IV Rocephin.  Patient also administered Tessalon and fentanyl for pain control in the setting of her cough.  Blood cultures were collected prior to antibiotic administration.  Patient on repeat assessment is well-appearing, at her baseline O2, not meeting SIRS criteria, not tachypneic, heart rates in the 80s.  CT Chest WO:  IMPRESSION:  Cardiomegaly and interstitial edema.  No focal pneumonia.   Suspected likely mild CHF exacerbation in addition to viral syndrome, no focal consolidation to suggest PNA. Pt without urinary symptoms. Medicine consulted for admission for continued management.  Advanced heart failure consult placed but had not yet heard back at time of admission. With elevated dimer and troponin and symptoms, pt NM perfusion study obtained and resulted negative.   Final Clinical Impression(s) / ED Diagnoses Final diagnoses:  Nasal congestion  Viral URI with cough  Elevated troponin  Acute on chronic congestive heart failure, unspecified heart failure type (HCC)  Positive D dimer    Rx / DC Orders ED Discharge Orders     None         Ernie Avena, MD 08/27/23 1610    Ernie Avena, MD 08/27/23 [id number]    Ernie Avena, MD 08/27/23 1825

## 2023-08-28 DIAGNOSIS — R0602 Shortness of breath: Secondary | ICD-10-CM | POA: Diagnosis not present

## 2023-08-28 DIAGNOSIS — R7989 Other specified abnormal findings of blood chemistry: Secondary | ICD-10-CM

## 2023-08-28 DIAGNOSIS — R931 Abnormal findings on diagnostic imaging of heart and coronary circulation: Secondary | ICD-10-CM | POA: Diagnosis not present

## 2023-08-28 DIAGNOSIS — I2510 Atherosclerotic heart disease of native coronary artery without angina pectoris: Secondary | ICD-10-CM | POA: Diagnosis not present

## 2023-08-28 LAB — PROCALCITONIN: Procalcitonin: 0.24 ng/mL

## 2023-08-28 LAB — COMPREHENSIVE METABOLIC PANEL
ALT: 14 U/L (ref 0–44)
AST: 12 U/L — ABNORMAL LOW (ref 15–41)
Albumin: 2.9 g/dL — ABNORMAL LOW (ref 3.5–5.0)
Alkaline Phosphatase: 125 U/L (ref 38–126)
Anion gap: 13 (ref 5–15)
BUN: 49 mg/dL — ABNORMAL HIGH (ref 6–20)
CO2: 23 mmol/L (ref 22–32)
Calcium: 8.5 mg/dL — ABNORMAL LOW (ref 8.9–10.3)
Chloride: 102 mmol/L (ref 98–111)
Creatinine, Ser: 2.23 mg/dL — ABNORMAL HIGH (ref 0.44–1.00)
GFR, Estimated: 25 mL/min — ABNORMAL LOW (ref >60)
Glucose, Bld: 136 mg/dL — ABNORMAL HIGH (ref 70–99)
Potassium: 3.9 mmol/L (ref 3.5–5.1)
Sodium: 138 mmol/L (ref 135–145)
Total Bilirubin: 0.8 mg/dL (ref 0.0–1.2)
Total Protein: 7.3 g/dL (ref 6.5–8.1)

## 2023-08-28 LAB — CBC
HCT: 30.7 % — ABNORMAL LOW (ref 36.0–46.0)
Hemoglobin: 9.8 g/dL — ABNORMAL LOW (ref 12.0–15.0)
MCH: 27.6 pg (ref 26.0–34.0)
MCHC: 31.9 g/dL (ref 30.0–36.0)
MCV: 86.5 fL (ref 80.0–100.0)
Platelets: 223 10*3/uL (ref 150–400)
RBC: 3.55 MIL/uL — ABNORMAL LOW (ref 3.87–5.11)
RDW: 14.6 % (ref 11.5–15.5)
WBC: 11 10*3/uL — ABNORMAL HIGH (ref 4.0–10.5)
nRBC: 0 % (ref 0.0–0.2)

## 2023-08-28 LAB — GLUCOSE, CAPILLARY
Glucose-Capillary: 98 mg/dL (ref 70–99)
Glucose-Capillary: 205 mg/dL — ABNORMAL HIGH (ref 70–99)
Glucose-Capillary: 146 mg/dL — ABNORMAL HIGH (ref 70–99)
Glucose-Capillary: 142 mg/dL — ABNORMAL HIGH (ref 70–99)

## 2023-08-28 MED ORDER — GUAIFENESIN ER 600 MG PO TB12
600.0000 mg | ORAL_TABLET | Freq: Two times a day (BID) | ORAL | Status: DC
  Administered 2023-08-28 – 2023-08-29 (×3): 600 mg via ORAL
  Filled 2023-08-28 (×3): qty 1

## 2023-08-28 MED ORDER — FUROSEMIDE 10 MG/ML IJ SOLN
40.0000 mg | Freq: Two times a day (BID) | INTRAMUSCULAR | Status: AC
  Administered 2023-08-28: 40 mg via INTRAVENOUS
  Filled 2023-08-28: qty 4

## 2023-08-28 NOTE — Consult Note (Addendum)
Cardiology Consultation   Patient ID: Dana [name] MRN: [id number]; DOB: 11/26/1960  Admit date: 08/26/2023 Date of Consult: 08/28/2023  PCP:  Blane Ohara, MD   [facility] Providers Cardiologist:  Arvilla Meres, MD      Patient Profile:   Dana [name] is a 63 y.o. female with a hx of CAD, diastolic heart failure s/p CardioMEMS, mitral valve regurgitation s/p repair, hypertension, hyperlipidemia, OSA on CPAP, PAF s/p ablation, PAD s/p left great toe and fourth toe amputation CKD, diabetes who is being seen 08/28/2023 for the evaluation of abnormal echo at the request of Dr. Jomarie Longs.  History of Present Illness:   Dana [name] is a 63 year old female with above medical history who is followed by Dr. Gala Romney.  Previously underwent right/left heart catheterization 12/2016 that showed patent mid LAD stent, patent PDA stent, nonobstructive disease otherwise.  Echocardiogram in 06/2017 showed EF 60-65% with mild LVH, no regional wall motion abnormalities.  Had a CardioMEMS pressure sensor implanted in 02/2018.  Echocardiogram in 08/2020 showed EF 55-60%, no regional wall motion abnormalities, grade 2 diastolic dysfunction, normal RV function, moderate MR.  Later admitted with NSTEMI in 09/2020 that showed three-vessel CAD with moderate-mid LAD and mid RCA stenosis, severe mid left circumflex stenosis.  Underwent successful PCI of the mid circumflex with DES.  There was moderately elevated LVEDP, started on at least 12 months of DAPT with aspirin and Plavix.  Echocardiogram from 09/2020 showed EF 55-60%, mild LVH, grade 2 diastolic dysfunction  In 12/2020, patient was admitted to [facility] for DKA.  Started on insulin drip and IV fluids.  She developed pulmonary vascular congestion versus pneumonia, diuretics were resumed.  Patient suffered a PEA arrest on 12/3620, had ROSC after 10-15 minutes of CPR.  Precipitating factor of PEA arrest thought to be respiratory driven.  Echocardiogram with  EF 60%, min MR/TR.  There was concern for stroke due to left-sided weakness, MRI confirmed anoxic brain injury but no definite infarct.  Patient admitted on 04/2021 for acute on chronic CHF after failing outpatient escalation of diuretic therapy.  She was enrolled in FASTR trial, diuresed greater than 15 pounds.  Right heart catheterization showed mild - moderately elevated filling pressures with large V waves in the PCWP tracing, suggesting severe MR versus severe diastolic dysfunction.  TEE confirmed severe central MR.  Underwent TEE ER of mitral clip with placement of 1XT W and 1NT W clip on 05/18/21. NT W clip was entangled and had to be deployed into lateral part of the valve, no additional clip placement could be preformed.  Echocardiogram 05/19/2021 with EF 50-55%, residual moderate MR.  Patient was admitted in 08/2022 with chest pain.  Cardiac catheterization showed two-vessel obstructive CAD with new 90% stenosis in the proximal LAD and occlusion of the previously placed stent in the left circumflex after a large first OM branch.  Underwent successful PCI of the proximal LAD with DES x 1 overlapping prior stent.  Recommended medical management of the left circumflex occlusion.  Distal RCA stent was patent.  Patient was again admitted in 11/2022 with NSTEMI.  Underwent cardiac catheterization on 12/08/2022 that showed severe multivessel disease with 90% in-stent restenosis in the proximal and midportion of the previously placed LAD stent.  This was treated with successful scoring balloon angioplasty.  There was also 85% ostial-proximal RCA disease, known occlusion of the left circumflex after OM1. Recommended staged intervention on the RCA.  She underwent echocardiogram on 12/09/2022 that showed EF 50-55% with no  regional wall motion abnormalities, mild LVH, mildly reduced RV function.  Post MitraClip x 2 with XT W and NT W, the latter entangled in cords.  Mild mitral valve regurgitation present.  After her  cath, patient was given IV fluids but continued to have worsening renal function with creatinine up to 4.25.  Heart failure and nephrology were consulted, overall no indication for HD.  Diuresed with IV Lasix and eventually transition to torsemide 80 mg daily.  Patient was again admitted in 02/2023 with chest pain.  Underwent left heart catheterization that showed severe restenosis in the proximal LAD stent that was treated with IVUS guided PTCA/DES x 1 to the proximal LAD.  There was chronic occlusion of the mid circumflex beyond the stented segment.  Patent OM branch with severe distal stenosis that was too small for PCI.  The RCA had moderate proximal stenosis with patent distal stent.  Renal function worsened after dye load from PCI, creatinine up to 3.35.  Nephrology was consulted and patient underwent TDC placement.  However, renal function improved and she did not require dialysis.  Patient most recently admitted in 06/2023 with unstable angina.  Underwent echocardiogram on 07/15/2023 that showed EF 55-60%, no regional wall motion abnormalities, mild LVH, normal RV function, normal PA systolic pressure.  There was mild MR with no evidence of mitral stenosis.  Underwent left heart catheterization on 07/16/2023 that showed severe restenosis in the proximal LAD stent.  There were 2 layers of stent in this segment, placed in 08/2022, 01/2023.  The distal LAD was small in caliber.  There was also chronic occlusion of the mid circumflex beyond the stented segment, patent OM branch with severe diffuse distal stenosis (too small for PCI). There was moderate ostial/proximal stenosis with patent distal stent.  Given her advanced kidney disease, patient underwent staged PCI of the proximal LAD with scoring balloon angioplasty.  She remained on aspirin and Brilinta.  Patient presented to the ED on 2/10 complaining of congestion, cough, fever.  In the ED, patient also reported having some chest tightness.  She had been  seen by her PCP a few days prior and was told she had a "bad cold".  Respiratory panel positive for coronavirus OC43.  High-sensitivity troponin 78, 123, 114.  CT chest without contrast showed cardiomegaly and interstitial edema, no focal pneumonia.  D-dimer was elevated, VQ scan showed no evidence of PE.  Patient underwent echocardiogram on 08/27/2023 that showed severe hypokinesis of the basal to mid posterior segments and basal inferior segment, EF 40 to 45%.  Patient s/p MitraClip with mild residual MR. Cardiology consulted due to echo findings.   On interview, patient reports that she came to the hospital due to chest and nasal congestion, cough, fever.  She denies having any chest pain or lower extremity swelling.  She has some orthopnea, but this is chronic and is no worse than usual.  Prior to starting to feel sick, she reports that she had been doing well from a cardiac perspective.  She was admitted in 06/2023 and had a heart catheterization, has not had chest pain since that time.  Has not had symptoms of hypervolemia since that time.  She is compliant with her medications.  Past Medical History:  Diagnosis Date   Acquired absence of left great toe (HCC)    Acquired absence of other left toe(s) (HCC)    ACS (acute coronary syndrome) (HCC)    Anemia    Anoxic brain injury (HCC)    Anxiety  CAD (coronary artery disease)    DES mLAD 04/07/15; DES dRCA & DES pPDA 08/30/16; DES mCX & PTCA OM1 09/29/20   Chronic diastolic (congestive) heart failure (HCC)    Chronic pain    Chronic systolic (congestive) heart failure (HCC)    CKD (chronic kidney disease)    Contrast dye induced nephropathy, possible 09/03/2020   COPD (chronic obstructive pulmonary disease) (HCC)    COVID-19 virus infection 03/03/2023   Diabetes (HCC)type 1    Diastolic CHF (HCC)    Dyspnea    Family history of breast cancer 10/23/2021   Gastro-esophageal reflux disease without esophagitis    Hyperlipidemia     Hypertension    Hypertensive heart disease with heart failure (HCC)    Hypoxemia    Idiopathic gout, multiple sites    Leukocytosis 03/29/2022   Localized edema    Low back pain    Mitral regurgitation    Mitral regurgitation    Mixed hyperlipidemia    Mixed incontinence    Morbid (severe) obesity due to excess calories (HCC)    Neuromuscular disorder (HCC)    Neuropathy    Non-ST elevation (NSTEMI) myocardial infarction (HCC)    08/29/20, 09/29/20   OSA (obstructive sleep apnea) 09/03/2020   Other specified hypothyroidism    PAF (paroxysmal atrial fibrillation) (HCC)    s/p pulmonary vein isolation by cryoablation 10/04/15 Dr. Rudolpho Sevin in [facility]   PEA (Pulseless electrical activity) [facility])    ~ 01/13/21 at [facility] during admission DKA, volume overload, possible CAP, hypoxia s/p ACLS with ROSC ~ 10-15   Pneumonia    Primary insomnia    Rheumatoid arthritis (HCC)    Stroke (HCC)    Thyroid disease    Type 1 diabetes mellitus (HCC)    Vitamin D deficiency     Past Surgical History:  Procedure Laterality Date   ABDOMINAL HYSTERECTOMY     Partial- Still has both ovaries   CARDIOVASCULAR STRESS TEST  08/13/2014   Nuclear; Normal   CARPAL TUNNEL RELEASE     CATARACT EXTRACTION, BILATERAL     CESAREAN SECTION     CHOLECYSTECTOMY     CORONARY ANGIOPLASTY WITH STENT PLACEMENT     3 blockages/ 1 stent   CORONARY BALLOON ANGIOPLASTY N/A 07/18/2023   Procedure: CORONARY BALLOON ANGIOPLASTY;  Surgeon: Kathleene Hazel, MD;  Location: [facility] INVASIVE CV LAB;  Service: Cardiovascular;  Laterality: N/A;   CORONARY PRESSURE/FFR STUDY N/A 09/07/2022   Procedure: INTRAVASCULAR PRESSURE WIRE/FFR STUDY;  Surgeon: Swaziland, Peter M, MD;  Location: [facility] INVASIVE CV LAB;  Service: Cardiovascular;  Laterality: N/A;   CORONARY STENT INTERVENTION N/A 09/29/2020   Procedure: CORONARY STENT INTERVENTION;  Surgeon: Tonny Bollman, MD;  Location: [facility] INVASIVE CV LAB;  Service: Cardiovascular;   Laterality: N/A;  CFX   CORONARY STENT INTERVENTION N/A 09/07/2022   Procedure: CORONARY STENT INTERVENTION;  Surgeon: Swaziland, Peter M, MD;  Location: [facility] INVASIVE CV LAB;  Service: Cardiovascular;  Laterality: N/A;   CORONARY STENT INTERVENTION N/A 02/13/2023   Procedure: CORONARY STENT INTERVENTION;  Surgeon: Kathleene Hazel, MD;  Location: [facility] INVASIVE CV LAB;  Service: Cardiovascular;  Laterality: N/A;  LAD   CORONARY ULTRASOUND/IVUS N/A 02/13/2023   Procedure: Coronary Ultrasound/IVUS;  Surgeon: Kathleene Hazel, MD;  Location: [facility] INVASIVE CV LAB;  Service: Cardiovascular;  Laterality: N/A;   CORONARY/GRAFT ACUTE MI REVASCULARIZATION N/A 12/08/2022   Procedure: Coronary/Graft Acute MI Revascularization;  Surgeon: Marykay Lex, MD;  Location: [facility] INVASIVE CV LAB;  Service: Cardiovascular;  Laterality: N/A;   EYE SURGERY     LEFT HEART CATH AND CORONARY ANGIOGRAPHY N/A 09/29/2020   Procedure: LEFT HEART CATH AND CORONARY ANGIOGRAPHY;  Surgeon: Tonny Bollman, MD;  Location: [facility] INVASIVE CV LAB;  Service: Cardiovascular;  Laterality: N/A;   LEFT HEART CATH AND CORONARY ANGIOGRAPHY N/A 09/07/2022   Procedure: LEFT HEART CATH AND CORONARY ANGIOGRAPHY;  Surgeon: Swaziland, Peter M, MD;  Location: [facility] INVASIVE CV LAB;  Service: Cardiovascular;  Laterality: N/A;   LEFT HEART CATH AND CORONARY ANGIOGRAPHY N/A 12/08/2022   Procedure: LEFT HEART CATH AND CORONARY ANGIOGRAPHY;  Surgeon: Marykay Lex, MD;  Location: [facility] INVASIVE CV LAB;  Service: Cardiovascular;  Laterality: N/A;   LEFT HEART CATH AND CORONARY ANGIOGRAPHY N/A 02/13/2023   Procedure: LEFT HEART CATH AND CORONARY ANGIOGRAPHY;  Surgeon: Kathleene Hazel, MD;  Location: [facility]VASIVE CV LAB;  Service: Cardiovascular;  Laterality: N/A;   LEFT HEART CATH AND CORONARY ANGIOGRAPHY N/A 07/16/2023   Procedure: LEFT HEART CATH AND CORONARY ANGIOGRAPHY;  Surgeon: Kathleene Hazel, MD;  Location: [facility] INVASIVE CV LAB;  Service:  Cardiovascular;  Laterality: N/A;   RIGHT HEART CATH N/A 04/27/2021   Procedure: RIGHT HEART CATH;  Surgeon: Dolores Patty, MD;  Location: [facility] INVASIVE CV LAB;  Service: Cardiovascular;  Laterality: N/A;   RIGHT/LEFT HEART CATH AND CORONARY ANGIOGRAPHY N/A 01/07/2017   Procedure: Right/Left Heart Cath and Coronary Angiography;  Surgeon: Laurey Morale, MD;  Location: [facility] INVASIVE CV LAB;  Service: Cardiovascular;  Laterality: N/A;   ROTATOR CUFF REPAIR     TEE WITHOUT CARDIOVERSION N/A 04/28/2021   Procedure: TRANSESOPHAGEAL ECHOCARDIOGRAM (TEE);  Surgeon: Dolores Patty, MD;  Location: [facility] ENDOSCOPY;  Service: Cardiovascular;  Laterality: N/A;   TEE WITHOUT CARDIOVERSION N/A 05/18/2021   Procedure: TRANSESOPHAGEAL ECHOCARDIOGRAM (TEE);  Surgeon: Orbie Pyo, MD;  Location: [facility] INVASIVE CV LAB;  Service: Cardiovascular;  Laterality: N/A;   TOE AMPUTATION Left    TRANSCATHETER MITRAL EDGE TO EDGE REPAIR N/A 05/18/2021   Procedure: MITRAL VALVE REPAIR;  Surgeon: Orbie Pyo, MD;  Location: [facility] INVASIVE CV LAB;  Service: Cardiovascular;  Laterality: N/A;   US ECHOCARDIOGRAPHY  05/2017   Normal     Home Medications:  Prior to Admission medications   Medication Sig Start Date End Date Taking? Authorizing Provider  acetaminophen (TYLENOL) 500 MG tablet Take 1,000 mg by mouth every 6 (six) hours as needed for mild pain (pain score 1-3) or headache.   Yes [provider]  allopurinol (ZYLOPRIM) 100 MG tablet Take 0.5 tablets (50 mg total) by mouth daily. 04/29/23  Yes Cox, Fritzi Mandes, MD  aspirin EC 81 MG tablet Take 1 tablet (81 mg total) by mouth daily. Swallow whole. 08/13/23  Yes Cox, Kirsten, MD  busPIRone (BUSPAR) 10 MG tablet Take 1 tablet (10 mg total) by mouth 3 (three) times daily. 08/14/23  Yes Cox, Kirsten, MD  carvedilol (COREG) 6.25 MG tablet Take 1.5 tablets (9.375 mg total) by mouth 2 (two) times daily. 08/05/23  Yes Alen Bleacher, NP  Continuous Glucose Sensor  (FREESTYLE LIBRE 2 SENSOR) MISC Inject 1 device into skin every 14 (fourteen) days. 08/14/23  Yes Cox, Kirsten, MD  EPINEPHrine 0.3 mg/0.3 mL IJ SOAJ injection Use as directed for life-threatening allergic reaction. 04/29/23  Yes Cox, Kirsten, MD  Evolocumab (REPATHA SURECLICK) 140 MG/ML SOAJ Inject 140 mg into the skin every 14 (fourteen) days. 04/29/23  Yes Cox, Kirsten, MD  ezetimibe (ZETIA) 10 MG  tablet Take 1 tablet (10 mg total) by mouth every evening. 04/29/23  Yes Cox, Kirsten, MD  gabapentin (NEURONTIN) 100 MG capsule Take 1 capsule (100 mg total) by mouth 2 (two) times daily. 08/13/23 11/13/23 Yes Sirivol, Mamatha, MD  Glucagon (GVOKE HYPOPEN 2-PACK) 0.5 MG/0.1ML SOAJ Inject 0.5 mg into the skin daily as needed. 07/12/23  Yes Cox, Kirsten, MD  insulin aspart (NOVOLOG FLEXPEN) 100 UNIT/ML FlexPen Inject 2 - 10 Units before meals based on SSI. 04/29/23  Yes Cox, Kirsten, MD  insulin glargine (LANTUS SOLOSTAR) 100 UNIT/ML Solostar Pen Inject 60 Units into the skin daily. Patient taking differently: Inject 33-35 Units into the skin daily. 05/01/23  Yes   Insulin Pen Needle (BD PEN NEEDLE NANO 2ND GEN) 32G X 4 MM MISC Inject 1 each into the skin in the morning, at noon, in the evening, and at bedtime. 08/30/21  Yes Cox, Kirsten, MD  isosorbide mononitrate (IMDUR) 30 MG 24 hr tablet Take 1 tablet (30 mg total) by mouth daily. 08/05/23  Yes Alen Bleacher, NP  latanoprost (XALATAN) 0.005 % ophthalmic solution Place 1 drop into both eyes at bedtime. 08/14/23  Yes Cox, Kirsten, MD  linaclotide [facility]) 145 MCG CAPS capsule Take 1 capsule (145 mcg total) by mouth daily before breakfast. Patient taking differently: Take 145 mcg by mouth daily as needed (for constipation). 04/29/23  Yes Cox, Kirsten, MD  LORazepam (ATIVAN) 0.5 MG tablet Take 1 tablet (0.5 mg total) by mouth daily as needed for anxiety. 04/29/23  Yes Cox, Kirsten, MD  magnesium oxide (MAG-OX) 400 MG tablet Take 2 tablets (800 mg total) by mouth  daily. Take 2 (400 mg) daily=800 mg 04/29/23  Yes Cox, Kirsten, MD  Menthol-Methyl Salicylate (ICY HOT EXTRA STRENGTH) 10-30 % CREA Apply 1 Application topically as needed (bilateral hips and knees).   Yes [provider]  nitroGLYCERIN (NITROSTAT) 0.4 MG SL tablet DISSOLVE ONE TABLET UNDER THE TONGUE EVERY 5 MINUTES AS NEEDED FOR CHEST PAIN.  DO NOT EXCEED A TOTAL OF 3 DOSES IN 15 MINUTES 04/29/23  Yes Cox, Kirsten, MD  Omega-3 Fatty Acids (FISH OIL PO) Take 1 capsule by mouth daily.   Yes [provider]  OXYGEN Inhale 2 L/min into the lungs at bedtime.   Yes [provider]  pantoprazole (PROTONIX) 40 MG tablet Take 1 tablet (40 mg total) by mouth daily. Patient taking differently: Take 40 mg by mouth daily before breakfast. 04/29/23  Yes Cox, Kirsten, MD  potassium chloride SA (KLOR-CON M) 20 MEQ tablet Take 2 tablets (40 mEq total) by mouth daily. Patient taking differently: Take 20 mEq by mouth See admin instructions. Take 20 mEq by mouth on Mondays  and Thursdays ONLY 04/29/23  Yes Cox, Kirsten, MD  Probiotic CAPS Take 1 capsule by mouth daily.   Yes [provider]  ticagrelor (BRILINTA) 90 MG TABS tablet Take 1 tablet (90 mg total) by mouth 2 (two) times daily. 04/29/23  Yes Cox, Kirsten, MD  torsemide (DEMADEX) 20 MG tablet Take 3 tablets (60 mg total) by mouth 2 (two) times daily for 3 days, THEN 3 tablets (60 mg total) daily. Patient taking differently:  3 tablets (60 mg total) daily. 07/20/23 01/20/24 Yes Narda Bonds, MD  HYDROcodone-acetaminophen (NORCO) 7.5-325 MG tablet Take 1 tablet by mouth 3 (three) times daily as needed. Patient taking differently: Take 1 tablet by mouth 3 (three) times daily. 08/14/23   Cox, Fritzi Mandes, MD  promethazine (PHENERGAN) 25 MG tablet Take 1 tablet (  25 mg total) by mouth daily as needed. Patient taking differently: Take 25 mg by mouth every 8 (eight) hours as needed for nausea or vomiting. 04/29/23   CoxFritzi Mandes, MD     Inpatient Medications: Scheduled Meds:  allopurinol  50 mg Oral Daily   aspirin EC  81 mg Oral Daily   busPIRone  10 mg Oral TID   carvedilol  9.375 mg Oral BID   enoxaparin (LOVENOX) injection  40 mg Subcutaneous Q24H   ezetimibe  10 mg Oral QPM   furosemide  40 mg Intravenous BID   gabapentin  100 mg Oral BID   guaiFENesin  600 mg Oral BID   insulin aspart  0-15 Units Subcutaneous TID WC   insulin aspart  0-5 Units Subcutaneous QHS   insulin glargine-yfgn  30 Units Subcutaneous Daily   isosorbide mononitrate  30 mg Oral Daily   latanoprost  1 drop Both Eyes QHS   magnesium oxide  800 mg Oral Daily   pantoprazole  40 mg Oral Daily   ticagrelor  90 mg Oral BID   Continuous Infusions:  PRN Meds: acetaminophen **OR** acetaminophen, HYDROcodone-acetaminophen, LORazepam, ondansetron **OR** ondansetron (ZOFRAN) IV  Allergies:    Allergies  Allergen Reactions   Naproxen Hives and Rash   Shellfish-Derived Products Hives, Swelling and Other (See Comments)    Angioedema    Strawberry (Diagnostic) Hives   Celecoxib Other (See Comments)    Stomach pain   Glucosamine Hives, Swelling and Other (See Comments)    Angioedema    Iron Hives and Other (See Comments)    IV iron transfusion - hives - Feb 2022 hospitalization  Patient said she CAN tolerate if given Benadryl first   Metoclopramide Other (See Comments)    Facial twitching and stuttering   Metolazone Other (See Comments)    Acute renal failure   Statins Other (See Comments)    Muscle pain    Social History:   Social History   Socioeconomic History   Marital status: Married    Spouse name: [name]   Number of children: 2   Years of education: 16   Highest education level: Master's degree (e.g., MA, MS, MEng, MEd, MSW, MBA)  Occupational History   Occupation: on disability/retired early former Engineer, site  Tobacco Use   Smoking status: Former    Current packs/day: 0.00    Types: Cigarettes    Start date:  22    Quit date: 1984    Years since quitting: 41.1    Passive exposure: Past   Smokeless tobacco: Never  Vaping Use   Vaping status: Never Used  Substance and Sexual Activity   Alcohol use: Never   Drug use: Never   Sexual activity: Not on file  Other Topics Concern   Not on file  Social History Narrative   Not on file   Social Drivers of Health   Financial Resource Strain: Low Risk  (07/01/2023)   Overall Financial Resource Strain (CARDIA)    Difficulty of Paying Living Expenses: Not hard at all  Food Insecurity: No Food Insecurity (08/27/2023)   Hunger Vital Sign    Worried About Running Out of Food in the Last Year: Never true    Ran Out of Food in the Last Year: Never true  Transportation Needs: No Transportation Needs (08/27/2023)   PRAPARE - Administrator, Civil Service (Medical): No    Lack of Transportation (Non-Medical): No  Physical Activity: Insufficiently Active (07/01/2023)   Exercise  Vital Sign    Days of Exercise per Week: 5 days    Minutes of Exercise per Session: 20 min  Stress: Stress Concern Present (07/01/2023)   [employer] of Occupational Health - Occupational Stress Questionnaire    Feeling of Stress : To some extent  Social Connections: Moderately Integrated (07/25/2022)   Social Connection and Isolation Panel [NHANES]    Frequency of Communication with Friends and Family: More than three times a week    Frequency of Social Gatherings with Friends and Family: More than three times a week    Attends Religious Services: More than 4 times per year    Active Member of [employer] or Organizations: No    Attends Banker Meetings: Never    Marital Status: Married  Catering manager Violence: Not At Risk (08/27/2023)   Humiliation, Afraid, Rape, and Kick questionnaire    Fear of Current or Ex-Partner: No    Emotionally Abused: No    Physically Abused: No    Sexually Abused: No    Family History:    Family History  Problem  Relation Age of Onset   Breast cancer Mother 74       contralateral breast ca dx 64   Coronary artery disease Mother    Hypertension Mother    Hyperlipidemia Mother    Diabetes type II Mother    Lung cancer Mother 74   Diabetes Father    Hypertension Father    Mental illness Sister    Bipolar disorder Other    Depression Other    Schizophrenia Other    Cancer Other        MGF's sisters x2; unknown type     ROS:  Please see the history of present illness.   All other ROS reviewed and negative.     Physical Exam/Data:   Vitals:   08/28/23 0151 08/28/23 0608 08/28/23 0731 08/28/23 1138  BP: (!) 133/51 (!) 147/62  (!) 147/60  Pulse: 70 76  79  Resp: 18 17 18 16   Temp: 98.3 F (36.8 C) 98.5 F (36.9 C) 98.9 F (37.2 C) 98.4 F (36.9 C)  TempSrc: Oral Oral Oral Oral  SpO2: 98% 97%  94%  Weight:  99 kg    Height:        Intake/Output Summary (Last 24 hours) at 08/28/2023 1338 Last data filed at 08/28/2023 0846 Gross per 24 hour  Intake 1080 ml  Output 1200 ml  Net -120 ml      08/28/2023    6:08 AM 08/27/2023    5:30 PM 08/27/2023    2:49 AM  Last 3 Weights  Weight (lbs) 218 lb 4.1 oz 219 lb 6.4 oz 220 lb  Weight (kg) 99 kg 99.519 kg 99.791 kg     Body mass index is 37.46 kg/m.  General:  Well nourished, well developed, in no acute distress.  Sitting upright in the bed HEENT: normal Neck: no JVD Vascular: Radial 2+ bilaterally Cardiac:  normal S1, S2; RRR; no murmur   Lungs:  clear to auscultation bilaterally, no wheezing, rhonchi or rales. Normal WOB on room air  Abd: soft, nontender  Ext: no edema in bilateral lower extremities, wearing TED hose Musculoskeletal:  No deformities Skin: warm and dry  Neuro:  CNs 2-12 intact, no focal abnormalities noted Psych:  Normal affect   EKG:  The EKG was personally reviewed and demonstrates:  EKG from 2/10 showed normal sinus rhythm with nonspecific ST abnormality, heart rate 88 bpm  Telemetry:  Telemetry was  personally reviewed and demonstrates: Normal sinus rhythm with 1 brief episode of atrial tachycardia  Relevant CV Studies: Cardiac Studies & Procedures   ______________________________________________________________________________________________ CARDIAC CATHETERIZATION  CARDIAC CATHETERIZATION 07/18/2023  Narrative   Mid LAD to Dist LAD lesion is 60% stenosed.   Mid LAD lesion is 90% stenosed.   Mid Cx to Dist Cx lesion is 100% stenosed.   1st Mrg-1 lesion is 30% stenosed.   1st Mrg-2 lesion is 90% stenosed.   Non-stenotic Mid Cx lesion was previously treated.   Scoring balloon angioplasty was performed using a BALLN SCOREFLEX 3.0X10.   Post intervention, there is a 10% residual stenosis.  Severe restenosis of a focal segment of the proximal LAD stented segment. Successful scoring balloon angioplasty of the proximal LAD stented segment.  Recommendations: She did well with her procedure. This will most likely not be a long lasting result. In July 2024 IVUS was performed and the stents are all well expanded in the proximal LAD. Scoring balloon angioplasty today alone. No new stents placed. Continue current medical therapy including ASA and Brilinta. Gentle hydration overnight and hopeful d/c home tomorrow.  Findings Coronary Findings Diagnostic  Dominance: Right  Left Main No significant disease.  Left Anterior Descending The LAD is diffusely diseased. The proximal vessel has nonobstructive disease. The mid vessel stent is patent. The mid/distal vessel has 60% stenosis beyond the stent. The distal vessel is diffusely diseased. Mid LAD lesion is 90% stenosed. The lesion was previously treated using a drug eluting stent between 6-12 months ago. Previously placed stent displays restenosis. Mid LAD to Dist LAD lesion is 60% stenosed.  Left Circumflex Large OM1.  30% mid LCx stenosis after OM1.  Moderate OM2 with 30-40% proximal stenosis.  30% distal LCx stenosis. Non-stenotic Mid Cx  lesion was previously treated. The lesion is moderately calcified. Mid Cx to Dist Cx lesion is 100% stenosed. The lesion was previously treated .  First Obtuse Marginal Branch 1st Mrg-1 lesion is 30% stenosed. The lesion was previously treated . 1st Mrg-2 lesion is 90% stenosed.  Third Obtuse Marginal Branch  Intervention  Mid LAD lesion Angioplasty CATH VISTA GUIDE 6FR XBLD 3.5 guide catheter was inserted. WIRE ASAHI PROWATER 180CM guidewire used to cross lesion. Scoring balloon angioplasty was performed using a BALLN SCOREFLEX 3.0X10. Post-Intervention Lesion Assessment The intervention was successful. Pre-interventional TIMI flow is 3. Post-intervention TIMI flow is 3. No complications occurred at this lesion. There is a 10% residual stenosis post intervention.   CARDIAC CATHETERIZATION 07/16/2023  Narrative Severe restenosis in the proximal LAD stent. There are two layers of stent in this segment (placed in February 2024 and July 2024). The distal LAD is small in caliber. Chronic occlusion mid Circumflex beyond the stented segment. Patent obtuse marginal branch with severe diffuse distal stenosis (too small for PCI) Moderate caliber dominant RCA with moderate ostial/proximal stenosis, patent distal stent. LVEDP=15 mmHg  Recommendations: She will need PCI of the proximal LAD stented segment. This PCI will be staged given her advanced kidney disease. Continue IV NTG.  Findings Coronary Findings Diagnostic  Dominance: Right  Left Main No significant disease.  Left Anterior Descending The LAD is diffusely diseased. The proximal vessel has nonobstructive disease. The mid vessel stent is patent. The mid/distal vessel has 60% stenosis beyond the stent. The distal vessel is diffusely diseased. Mid LAD lesion is 90% stenosed. The lesion was previously treated using a drug eluting stent between 6-12 months ago. Previously placed stent displays restenosis.  Mid LAD to Dist LAD lesion  is 60% stenosed.  Left Circumflex Large OM1.  30% mid LCx stenosis after OM1.  Moderate OM2 with 30-40% proximal stenosis.  30% distal LCx stenosis. Non-stenotic Mid Cx lesion was previously treated. The lesion is moderately calcified. Mid Cx to Dist Cx lesion is 100% stenosed. The lesion was previously treated .  First Obtuse Marginal Branch 1st Mrg-1 lesion is 30% stenosed. The lesion was previously treated . 1st Mrg-2 lesion is 90% stenosed.  Third Obtuse Marginal Branch Collaterals 3rd Mrg filled by collaterals from [facility].  Right Coronary Artery There is moderate diffuse disease throughout the vessel. Serial 30-40% stenoses in the proximal RCA.  Long stented segment in the PDA was patent. Ost RCA to Prox RCA lesion is 65% stenosed. The lesion is calcified. Significant catheter damping with engagement. Non-stenotic Dist RCA lesion was previously treated.  Intervention  No interventions have been documented.     ECHOCARDIOGRAM  ECHOCARDIOGRAM COMPLETE 08/27/2023  Narrative ECHOCARDIOGRAM REPORT    Patient Name:   Dana [name] Date of Exam: 08/27/2023 Medical Rec #:  [id number]      Height:       64.0 in Accession #:    [id number]     Weight:       220.0 lb Date of Birth:  01/31/1961      BSA:          2.037 m Patient Age:    62 years       BP:           144/60 mmHg Patient Gender: F              HR:           70 bpm. Exam Location:  Inpatient  Procedure: 2D Echo, Color Doppler, Cardiac Doppler and Intracardiac Opacification Agent  Indications:    CHF  History:        Patient has prior history of Echocardiogram examinations, most recent 07/15/2023.  Sonographer:    Harriette Bouillon RDCS Referring Phys: [phone] JESSICA U VANN  IMPRESSIONS   1. LVEF mildly reduced. Severe hypokinesis of the basal to mid posterior segments and basal inferior segment. Left ventricular ejection fraction, by estimation, is 40 to 45%. The left ventricle has mildly decreased function. The  left ventricle demonstrates regional wall motion abnormalities (see scoring diagram/findings for description). Left ventricular diastolic function could not be evaluated. 2. Right ventricular systolic function was not well visualized. The right ventricular size is not well visualized. Tricuspid regurgitation signal is inadequate for assessing PA pressure. 3. Left atrial size was mildly dilated. 4. S/p mitraclip. Mild residual MR. The mitral valve has been repaired/replaced. Mild mitral valve regurgitation. No evidence of mitral stenosis. The mean mitral valve gradient is 4.3 mmHg with average heart rate of 69 bpm. 5. The aortic valve is grossly normal. Aortic valve regurgitation is not visualized. No aortic stenosis is present. 6. The inferior vena cava is normal in size with <50% respiratory variability, suggesting right atrial pressure of 8 mmHg.  Comparison(s): Changes from prior study are noted. LVEF now mildly reduced with new WMA in the posterior/inferior segments as detailed above.  FINDINGS Left Ventricle: LVEF mildly reduced. Severe hypokinesis of the basal to mid posterior segments and basal inferior segment. Left ventricular ejection fraction, by estimation, is 40 to 45%. The left ventricle has mildly decreased function. The left ventricle demonstrates regional wall motion abnormalities. The left ventricular internal cavity size was normal in size. There  is no left ventricular hypertrophy. Left ventricular diastolic function could not be evaluated due to mitral valve repair. Left ventricular diastolic function could not be evaluated.   LV Wall Scoring: The posterior wall and basal inferior segment are hypokinetic.  Right Ventricle: The right ventricular size is not well visualized. Right vetricular wall thickness was not well visualized. Right ventricular systolic function was not well visualized. Tricuspid regurgitation signal is inadequate for assessing PA pressure.  Left Atrium:  Left atrial size was mildly dilated.  Right Atrium: Right atrial size was not well visualized.  Pericardium: There is no evidence of pericardial effusion.  Mitral Valve: S/p mitraclip. Mild residual MR. The mitral valve has been repaired/replaced. Mild mitral valve regurgitation. There is a Mitra-Clip present in the mitral position. No evidence of mitral valve stenosis. The mean mitral valve gradient is 4.3 mmHg with average heart rate of 69 bpm.  Tricuspid Valve: The tricuspid valve is grossly normal. Tricuspid valve regurgitation is trivial. No evidence of tricuspid stenosis.  Aortic Valve: The aortic valve is grossly normal. Aortic valve regurgitation is not visualized. No aortic stenosis is present.  Pulmonic Valve: The pulmonic valve was grossly normal. Pulmonic valve regurgitation is not visualized. No evidence of pulmonic stenosis.  Aorta: The aortic root and ascending aorta are structurally normal, with no evidence of dilitation.  Venous: The inferior vena cava is normal in size with less than 50% respiratory variability, suggesting right atrial pressure of 8 mmHg.  IAS/Shunts: The interatrial septum was not well visualized.   LEFT VENTRICLE PLAX 2D LVIDd:         5.20 cm Diastology LVIDs:         3.70 cm LV e' medial:    2.72 cm/s LV PW:         1.10 cm LV E/e' medial:  56.6 LV IVS:        1.10 cm LV e' lateral:   3.15 cm/s LV E/e' lateral: 48.9   IVC IVC diam: 1.80 cm  LEFT ATRIUM           Index LA diam:      3.80 cm 1.87 cm/m LA Vol (A4C): 73.0 ml 35.83 ml/m AORTIC VALVE LVOT Vmax:   68.50 cm/s LVOT Vmean:  53.800 cm/s LVOT VTI:    0.159 m  AORTA Ao Asc diam: 3.20 cm  MITRAL VALVE MV Area (PHT): 2.09 cm     SHUNTS MV Mean grad:  4.3 mmHg     Systemic VTI: 0.16 m MV Decel Time: 363 msec MV E velocity: 154.00 cm/s MV A velocity: 99.20 cm/s MV E/A ratio:  1.55  Lennie Odor MD Electronically signed by Lennie Odor MD Signature Date/Time:  08/27/2023/4:10:37 PM    Final   TEE  ECHO TEE 05/18/2021  Narrative TRANSESOPHOGEAL ECHO REPORT    Patient Name:   Dana [name] Date of Exam: 05/18/2021 Medical Rec #:  [id number]      Height:       64.5 in Accession #:    [id number]     Weight:       231.5 lb Date of Birth:  October 25, 1960      BSA:          2.093 m Patient Age:    60 years       BP:           105/41 mmHg Patient Gender: F              HR:  65 bpm. Exam Location:  Inpatient  Procedure: 3D Echo, Cardiac Doppler, Color Doppler and Transesophageal Echo  Indications:     Severe mitral regurgitation [161096]  History:         Patient has prior history of Echocardiogram examinations, most recent 05/02/2021. CHF, Previous Myocardial Infarction and CAD, Arrythmias:Atrial Fibrillation, Signs/Symptoms:Dyspnea; Risk Factors:Hypertension, Dyslipidemia, Diabetes and Sleep Apnea. Chronic kidney disease.  Mitral Valve: Mitral clip XT W, NT W valve is present in the mitral position. Procedure Date: 05/18/2021.  Sonographer:     Leta Jungling RDCS Referring Phys:  [id number] Orbie Pyo Diagnosing Phys: Thurmon Fair MD  PROCEDURE: After discussion of the risks and benefits of a TEE, an informed consent was obtained from the patient. The patient was intubated. The transesophogeal probe was passed without difficulty through the esophogus of the patient. Imaged were obtained with the patient in a supine position. Sedation performed by different physician. The patient was monitored while under deep sedation. Anesthestetic sedation was provided intravenously by Anesthesiology: 150mg  of Propofol. Image quality was good. The patient developed no complications during the procedure.  TEE with live 3D and 3D postprocessing was performed throughout all stages of the procedure, including transseptal puncture, device deployment, assessment of final results and evaluation for complications  PREPROCEDURE FINDINGS:  At  baseline, left ventricular systolic function is normal. Estimated LVEF is 65%. There is hypokinesis of the basal inferolateral wall. There is severe mitral insuficiency with a central jet. There is malcoaptation of the leaflets, primarily due to tethering of the posterior leaflet. Baseline valve area by planimetry 5.7 cm sq. Mean gradient 3 mm Hg at 60 bpm. ERO area by 3D PISA was 0.7 cm sq. Regurgitant volume 132 ml, regurgitant fraction 65%. There is bilateral blunting of pulmonary vein systolic flow. There is a physiological amount of pericardial fluid.  POSTPROCEDURE FINDINGS:  After deployment of the initial MitraClip XTW, the device is well seated with excellent tissue bridge, but there is residual moderate mitral insufficiency lateral to the clip.  A second device, Mitraclip NTW was advanced lateral to the original one. Difficulty was encountered in positioning of the second clip and it became apparent that the anterior leaflet was attached to the second clip, pulling it towards the posterior arm of the clip. Despite extensive efforts, the device could not be freed from the anterior leaflet, nor could the posterior leaflet be grasped. The device was deployed attached only to the anterior leaflet. Left ventricular systolic function remained normal. Estimated LVEF is 65%. There is unchanged hypokinesis of the basal inferolateral wall. Both clips appear stable in their position. There is moderate to severe mitral insuficiency with major regurgitant jets between the two clips and lateral to the second clip. There is a small iatrogenic secundum atrial septal defect with exclusively left-to-right shunt. With the caveats related to regurgitation evaluation using the continuity equation, regurgitant volume was 68 ml, regurgitant fraction 45%, calculated ERO area at the end of the procedure 0.5 cm sq. There is an unchanged physiological amount of pericardial fluid.    IMPRESSIONS   1. Left  ventricular ejection fraction, by estimation, is 65%%. The left ventricle has normal function. The left ventricle demonstrates regional wall motion abnormalities (see scoring diagram/findings for description). Left ventricular diastolic parameters are consistent with Grade II diastolic dysfunction (pseudonormalization). Elevated left atrial pressure. 2. Right ventricular systolic function is normal. The right ventricular size is normal. 3. Left atrial size was mildly dilated. No left atrial/left atrial appendage thrombus was detected. 4.  Right atrial size was mildly dilated. 5. The mitral valve is abnormal. Severe mitral valve regurgitation. There is a Mitral clip XT W, NT W present in the mitral position. Procedure Date: 05/18/2021. 6. The aortic valve is tricuspid. Aortic valve regurgitation is not visualized. 7. Evidence of atrial level shunting detected by color flow Doppler. There is a small patent foramen ovale with predominantly left to right shunting across the atrial septum.  FINDINGS Left Ventricle: Left ventricular ejection fraction, by estimation, is 65%%. The left ventricle has normal function. The left ventricle demonstrates regional wall motion abnormalities. The left ventricular internal cavity size was normal in size. There is no left ventricular hypertrophy. Left ventricular diastolic parameters are consistent with Grade II diastolic dysfunction (pseudonormalization).  Right Ventricle: The right ventricular size is normal. No increase in right ventricular wall thickness. Right ventricular systolic function is normal.  Left Atrium: Left atrial size was mildly dilated. No left atrial/left atrial appendage thrombus was detected.  Right Atrium: Right atrial size was mildly dilated.  Pericardium: Trivial pericardial effusion is present.  Mitral Valve: The mitral valve is abnormal. Severe mitral valve regurgitation. There is a Mitral clip XT W, NT W present in the mitral position.  Procedure Date: 05/18/2021.  Tricuspid Valve: The tricuspid valve is normal in structure. Tricuspid valve regurgitation is trivial.  Aortic Valve: The aortic valve is tricuspid. Aortic valve regurgitation is not visualized.  Pulmonic Valve: The pulmonic valve was normal in structure. Pulmonic valve regurgitation is not visualized.  Aorta: The aortic root, ascending aorta and aortic arch are all structurally normal, with no evidence of dilitation or obstruction.  IAS/Shunts: Evidence of atrial level shunting detected by color flow Doppler. A small patent foramen ovale is detected with predominantly left to right shunting across the atrial septum.   LEFT VENTRICLE PLAX 2D LVOT diam:     2.10 cm LV SV:         67 LV SV Index:   32 LVOT Area:     3.46 cm   AORTIC VALVE LVOT Vmax:   78.40 cm/s LVOT Vmean:  57.600 cm/s LVOT VTI:    0.194 m  MR Peak grad:      111.3 mmHg MR Mean grad:      84.0 mmHg    SHUNTS MR Vmax:           527.50 cm/s  Systemic VTI:  0.19 m MR Vmean:          448.0 cm/s   Systemic Diam: 2.10 cm MR Vena Contracta: 0.52 cm MR PISA:           2.52 cm MR PISA Radius:    0.63 cm  Mihai Croitoru MD Electronically signed by Thurmon Fair MD Signature Date/Time: 05/18/2021/3:05:55 PM    Final  MONITORS  LONG TERM MONITOR (3-14 DAYS) 04/11/2021  Narrative Patch Wear Time:  14 days and 0 hours (2022-08-31T12:08:59-0400 to 2022-09-14T12:09:09-398)  1. Sinus rhythm - min HR of 51 bpm, max HR of 190 bpm, and avg HR of 70 bpm. Predominant 2. 2. One run of nonsustained Ventricular Tachycardia occurred lasting 5 beats with a max rate of 171 bpm (avg 128 bpm). 3. 66 runs of Supraventricular Tachycardia runs occurred, the run with the fastest interval lasting 5 beats with a max rate of 190 bpm, the longest lasting 16.6 secs with an avg rate of 125 bpm.  4. 4. Frequent PACs (11.2%, 153169) 5. Rare PVCs  Arvilla Meres, MD 3:53 PM  ______________________________________________________________________________________________       Laboratory Data:  High Sensitivity Troponin:   Recent Labs  Lab 08/26/23 2233 08/27/23 0049 08/27/23 0330  TROPONINIHS 78* 123* 114*     Chemistry Recent Labs  Lab 08/26/23 2233 08/27/23 0744 08/28/23 0249  NA 136 138 138  K 4.4 4.3 3.9  CL 102 102 102  CO2 22 21* 23  GLUCOSE 257* 256* 136*  BUN 43* 42* 49*  CREATININE 1.93* 1.81* 2.23*  CALCIUM 8.9 8.7* 8.5*  GFRNONAA 29* 32* 25*  ANIONGAP 12 15 13     Recent Labs  Lab 08/26/23 2233 08/28/23 0249  PROT 8.1 7.3  ALBUMIN 3.4* 2.9*  AST 19 12*  ALT 18 14  ALKPHOS 189* 125  BILITOT 1.2 0.8   Lipids No results for input(s): "CHOL", "TRIG", "HDL", "LABVLDL", "LDLCALC", "CHOLHDL" in the last 168 hours.  Hematology Recent Labs  Lab 08/26/23 2233 08/28/23 0249  WBC 13.0* 11.0*  RBC 3.88 3.55*  HGB 10.9* 9.8*  HCT 33.8* 30.7*  MCV 87.1 86.5  MCH 28.1 27.6  MCHC 32.2 31.9  RDW 14.9 14.6  PLT 271 223   Thyroid No results for input(s): "TSH", "FREET4" in the last 168 hours.  BNP Recent Labs  Lab 08/26/23 2233  BNP 511.3*    DDimer  Recent Labs  Lab 08/27/23 0338  DDIMER 1.52*     Radiology/Studies:  ECHOCARDIOGRAM COMPLETE Result Date: 08/27/2023    ECHOCARDIOGRAM REPORT   Patient Name:   Dana [name] Date of Exam: 08/27/2023 Medical Rec #:  [id number]      Height:       64.0 in Accession #:    [id number]     Weight:       220.0 lb Date of Birth:  1961/05/16      BSA:          2.037 m Patient Age:    62 years       BP:           144/60 mmHg Patient Gender: F              HR:           70 bpm. Exam Location:  Inpatient Procedure: 2D Echo, Color Doppler, Cardiac Doppler and Intracardiac            Opacification Agent Indications:    CHF  History:        Patient has prior history of Echocardiogram examinations, most                 recent 07/15/2023.  Sonographer:    Harriette Bouillon RDCS Referring Phys:  [phone] JESSICA U VANN IMPRESSIONS  1. LVEF mildly reduced. Severe hypokinesis of the basal to mid posterior segments and basal inferior segment. Left ventricular ejection fraction, by estimation, is 40 to 45%. The left ventricle has mildly decreased function. The left ventricle demonstrates regional wall motion abnormalities (see scoring diagram/findings for description). Left ventricular diastolic function could not be evaluated.  2. Right ventricular systolic function was not well visualized. The right ventricular size is not well visualized. Tricuspid regurgitation signal is inadequate for assessing PA pressure.  3. Left atrial size was mildly dilated.  4. S/p mitraclip. Mild residual MR. The mitral valve has been repaired/replaced. Mild mitral valve regurgitation. No evidence of mitral stenosis. The mean mitral valve gradient is 4.3 mmHg with average heart rate of 69 bpm.  5. The aortic valve is grossly normal. Aortic valve regurgitation is not  visualized. No aortic stenosis is present.  6. The inferior vena cava is normal in size with <50% respiratory variability, suggesting right atrial pressure of 8 mmHg. Comparison(s): Changes from prior study are noted. LVEF now mildly reduced with new WMA in the posterior/inferior segments as detailed above. FINDINGS  Left Ventricle: LVEF mildly reduced. Severe hypokinesis of the basal to mid posterior segments and basal inferior segment. Left ventricular ejection fraction, by estimation, is 40 to 45%. The left ventricle has mildly decreased function. The left ventricle demonstrates regional wall motion abnormalities. The left ventricular internal cavity size was normal in size. There is no left ventricular hypertrophy. Left ventricular diastolic function could not be evaluated due to mitral valve repair. Left  ventricular diastolic function could not be evaluated.  LV Wall Scoring: The posterior wall and basal inferior segment are hypokinetic. Right Ventricle: The right  ventricular size is not well visualized. Right vetricular wall thickness was not well visualized. Right ventricular systolic function was not well visualized. Tricuspid regurgitation signal is inadequate for assessing PA pressure. Left Atrium: Left atrial size was mildly dilated. Right Atrium: Right atrial size was not well visualized. Pericardium: There is no evidence of pericardial effusion. Mitral Valve: S/p mitraclip. Mild residual MR. The mitral valve has been repaired/replaced. Mild mitral valve regurgitation. There is a Mitra-Clip present in the mitral position. No evidence of mitral valve stenosis. The mean mitral valve gradient is 4.3  mmHg with average heart rate of 69 bpm. Tricuspid Valve: The tricuspid valve is grossly normal. Tricuspid valve regurgitation is trivial. No evidence of tricuspid stenosis. Aortic Valve: The aortic valve is grossly normal. Aortic valve regurgitation is not visualized. No aortic stenosis is present. Pulmonic Valve: The pulmonic valve was grossly normal. Pulmonic valve regurgitation is not visualized. No evidence of pulmonic stenosis. Aorta: The aortic root and ascending aorta are structurally normal, with no evidence of dilitation. Venous: The inferior vena cava is normal in size with less than 50% respiratory variability, suggesting right atrial pressure of 8 mmHg. IAS/Shunts: The interatrial septum was not well visualized.  LEFT VENTRICLE PLAX 2D LVIDd:         5.20 cm Diastology LVIDs:         3.70 cm LV e' medial:    2.72 cm/s LV PW:         1.10 cm LV E/e' medial:  56.6 LV IVS:        1.10 cm LV e' lateral:   3.15 cm/s                        LV E/e' lateral: 48.9  IVC IVC diam: 1.80 cm LEFT ATRIUM           Index LA diam:      3.80 cm 1.87 cm/m LA Vol (A4C): 73.0 ml 35.83 ml/m  AORTIC VALVE LVOT Vmax:   68.50 cm/s LVOT Vmean:  53.800 cm/s LVOT VTI:    0.159 m  AORTA Ao Asc diam: 3.20 cm MITRAL VALVE MV Area (PHT): 2.09 cm     SHUNTS MV Mean grad:  4.3 mmHg     Systemic  VTI: 0.16 m MV Decel Time: 363 msec MV E velocity: 154.00 cm/s MV A velocity: 99.20 cm/s MV E/A ratio:  1.55 Lennie Odor MD Electronically signed by Lennie Odor MD Signature Date/Time: 08/27/2023/4:10:37 PM    Final    NM Pulmonary Perfusion Result Date: 08/27/2023 CLINICAL DATA:  Concern for pulmonary embolism. Positive D-dimer. Cough  and congestion and respiratory illness. EXAM: NUCLEAR MEDICINE PERFUSION LUNG SCAN TECHNIQUE: Perfusion images were obtained in multiple projections after intravenous injection of radiopharmaceutical. Ventilation scans intentionally deferred if perfusion scan and chest x-ray adequate for interpretation during COVID 19 epidemic. RADIOPHARMACEUTICALS:  4 mCi Tc-9m MAA IV COMPARISON:  Chest CT dated 08/27/2023. FINDINGS: There is uniform perfusion uptake. No large, wedge-shaped, or segmental perfusion defect. IMPRESSION: No scintigraphic evidence of pulmonary embolism. Electronically Signed   By: Elgie Collard M.D.   On: 08/27/2023 10:51   CT CHEST WO CONTRAST Result Date: 08/27/2023 CLINICAL DATA:  Respiratory illness with nondiagnostic x-ray. EXAM: CT CHEST WITHOUT CONTRAST TECHNIQUE: Multidetector CT imaging of the chest was performed following the standard protocol without IV contrast. RADIATION DOSE REDUCTION: This exam was performed according to the departmental dose-optimization program which includes automated exposure control, adjustment of the mA and/or kV according to patient size and/or use of iterative reconstruction technique. COMPARISON:  07/13/2023 FINDINGS: Cardiovascular: Cardiac enlargement. No pericardial effusion. Extensive atheromatous calcification of the coronaries. There is a chronic foreign body in the left lower lobe pulmonary arteries. Mediastinum/Nodes: Generous sized mediastinal lymph nodes remain generalized and size stable, pulmonary findings suggesting congestion. Lungs/Pleura: Generalized airway thickening with interlobular septal thickening  to a mild degree. No consolidation. Mild lingular atelectasis. Upper Abdomen: Extensive arterial calcification. Musculoskeletal: Ordinary thoracic spondylosis. IMPRESSION: Cardiomegaly and interstitial edema.  No focal pneumonia. Electronically Signed   By: Tiburcio Pea M.D.   On: 08/27/2023 06:57   DG Chest 2 View Result Date: 08/26/2023 CLINICAL DATA:  Nasal congestion and cough. EXAM: CHEST - 2 VIEW COMPARISON:  July 13, 2023 FINDINGS: The cardiac silhouette is mildly enlarged and unchanged in size. Mild atelectatic changes are suspected within the left lung base. No pleural effusion or pneumothorax is identified. The visualized skeletal structures are unremarkable. IMPRESSION: Stable cardiomegaly with left basilar atelectasis. Electronically Signed   By: Aram Candela M.D.   On: 08/26/2023 23:12     Assessment and Plan:   Abnormal Echocardiogram  Chronic combined systolic and diastolic heart failure -Echocardiogram this admission showed EF 45-45% with regional wall motion abnormalities.  Previous echo from 06/2023 had shown EF 55-60% with no regional wall motion abnormalities -BNP 511 on 2/10.  CT chest on 2/11 showed cardiomegaly and interstitial edema -Patient has received IV Lasix 40 mg twice daily.  I's/O's not recorded.  Creatinine increased from 1.81 yesterday to 2.23 today -Patient is euvolemic on exam.  Stop IV Lasix. -If renal function allows, plan to resume home torsemide 60 mg daily tomorrow  -GDMT limited by renal function -Continue carvedilol 9.375 mg twice daily, Imdur 30 mg daily -Patient has extensive history of CAD, suspect this is contributing to her reduced EF and wall motion abnormalities.  She is chest pain-free.  Will discuss with Dr. Clifton James.  She just had a heart catheterization about 1 month ago and had staged PCI of proximal LAD with scoring balloon angioplasty.  With her stable symptoms and known CAD, I do not think repeating a cath or ischemic evaluation  would affect treatment plan   CAD  -Patient has extensive CAD history.  Previously had stents placed to the mid LAD, PDA in 2018.  Later had DES to the mid circumflex in 09/2020.   - In 08/2022, patient found to have 90% stenosis in the proximal LAD, occlusion of the previously placed stent in the left circumflex.  Underwent PCI to the proximal LAD with DES x 1 overlapping previously placed mid LAD  stent. -Again admitted in 11/2022 with NSTEMI, found to have in-stent restenosis in the proximal and midportion of the previously placed LAD stent that was treated with successful scoring balloon angioplasty.  In 02/2023, again found to have severe restenosis in the proximal LAD stent, treated with IVUS guided PTCA/DES x 1. -Most recently, patient admitted in 06/2023 with unstable angina.  Cardiac catheterization 07/16/2023 showed severe restenosis in the proximal LAD stent.  There was also chronic occlusion of the mid circumflex, moderate ostial/proximal stenosis of the RCA, patent distal RCA stent.  She underwent staged PCI of the proximal LAD with scoring balloon angioplasty.  Remainder of her CAD managed medically -Patient now admitted with upper respiratory infection.  She denies any chest pain.  EKG is nonischemic.  Echocardiogram showed regional wall motion abnormalities -Continue aspirin 81 mg, Brilinta 90 mg twice daily -Continue carvedilol 9.375 mg twice daily, Imdur 30 mg daily -Continue Repatha, Zetia  Demand Ischemia  - hsTn 78>>123>>114  - Patient chest pain free. Suspect demand is ischemia with URI, CHF - No ischemic evaluation at this time as above   MR s/p mitraclip  - Has mild residual MR on echo- this has been present and stable over time   Otherwise per primary  - CKD stage IV- followed by nephrology as an outpatient  - URI, coronavirus OC43 - Chronic anemia  - OSA - COPD   Risk Assessment/Risk Scores:    For questions or updates, please contact [facility] Please  consult [email or web address] for contact info under    Signed, Jonita Albee, PA-C  08/28/2023 1:38 PM   I have personally seen and examined this patient. I agree with the assessment and plan as outlined above.  63 yo female with complex history including CAD, chronic diastolic CHF, MV regurgitation s/p repair, HTN, HLD, sleep apnea, PAF,PAD and DM admitted with viral upper respiratory infection confirmed by testing. Last cath in December 2024 with PCI of the re-stenosis in the LAD stent treated with scoring balloon angioplasty. She did well following her PCI with no chest pain since then. She has had cough, congestion and fevers.  Mild troponin elevation.  Echo with report of possible near inferior wall motion abnormality.  Labs reviewed by me.  EKG reviewed by me and shows sinus, no ischemic changes Echo reviewed by me. Difficult to see all of the walls of the LV but overall preserved EF with inferior wall HK. Comparison with echo from December 2024 and no clear change to me as the inferior wall was not normal in those images.  Cath images from December 2024 personally reviewed by me.   Exam: NAD  CV:RRR Lungs: clear bilaterally Ext: no LE edema  Plan: She has advanced CAD with multi-vessel stenting. She is not presenting with symptoms concerning for ACS. Mild troponin elevation is likely due to demand ischemia in setting of active viral infection. Echo with wall motion abnormality that is reported during this admission was not reported on her last echo but her echo image quality is poor. I do not think this is an acute change. She is doing well from a cardiac standpoint. I would not plan an ischemic evaluation at this time.  OK to discharge home from our standpoint.   [name], MD, [facility] 08/28/2023 3:35 PM

## 2023-08-28 NOTE — Progress Notes (Addendum)
PROGRESS NOTE    Dana [name]  ZOX:[id number] DOB: 1961-06-08 DOA: 08/26/2023 PCP: Blane Ohara, MD   63/M w diastolic CHF, obesity, OSA, PEA arrest, COPD, CAD with recent PCI of LAD in 12/24 presented to the ED with nasal congestion, cough, fevers, headache along with shortness of breath In the ED creatinine was 1.9, BNP 511, WBC 13, UA abnormal, noncontrast CT chest with cardiomegaly and interstitial edema, nuclear medicine scan low probability, RVP positive for coronavirus OC43    Subjective: -Feels better, congested, chronic orthopnea  Assessment and Plan:  URI, coronavirus OC43 -Supportive care, Mucinex, flutter valve  Acute on chronic systolic and diastolic CHF -Echo with EF down to 40-45% with wall motion abnormality, changed from ECHO 07/15/23 -Cardiac catheterization/PCI 07/18/2023: Mid LAD to Dist LAD lesion is 60% stenosed., Mid LAD lesion is 90% stenosed, Mid Cx to Dist Cx lesion is 100% stenosed,  1st Mrg-2 lesion is 90% stenosed, Non-stenotic Mid Cx lesion was previously treated, balloon angioplasty of prox LAD stented segment -Continue IV Lasix 1 more day, GDMT limited by CKD -Will request cardiology input considering echo findings -Avoid SGLT2i, history of UTIs  CAD -See discussion above regarding angioplasty of proximal LAD stented segment -Continue aspirin, Brilinta, Coreg, Zetia, Imdur  AKI on CKD 4 -Baseline creatinine around 2, recent history of contrast-induced nephropathy -Creatinine 1.8 on admission, now 2.2, likely cardiorenal -IV Lasix 1 more day, switch to oral diuretics tomorrow  Chronic anemia Monitor  OSA Resume CPAP  COPD Stable, monitor  Abnormal UA No symptoms of UTI will monitor    DVT prophylaxis: lovenox Code Status: Full Code Family Communication: none present Disposition Plan: Home tomorrow  Consultants:    Procedures:   Antimicrobials:    Objective: Vitals:   08/28/23 0151 08/28/23 0608 08/28/23 0731 08/28/23 1138   BP: (!) 133/51 (!) 147/62  (!) 147/60  Pulse: 70 76  79  Resp: 18 17 18 16   Temp: 98.3 F (36.8 C) 98.5 F (36.9 C) 98.9 F (37.2 C) 98.4 F (36.9 C)  TempSrc: Oral Oral Oral Oral  SpO2: 98% 97%  94%  Weight:  99 kg    Height:        Intake/Output Summary (Last 24 hours) at 08/28/2023 1232 Last data filed at 08/28/2023 0846 Gross per 24 hour  Intake 1080 ml  Output 1200 ml  Net -120 ml   Filed Weights   08/27/23 0249 08/27/23 1730 08/28/23 8119  Weight: 99.8 kg 99.5 kg 99 kg    Examination:  General exam: Appears calm and comfortable  HEENT: Positive JVD Respiratory system: Few scattered rhonchi, basilar Rales Cardiovascular system: S1 & S2 heard, RRR.  Abd: nondistended, soft and nontender.Normal bowel sounds heard. Central nervous system: Alert and oriented. No focal neurological deficits. Extremities: Trace Skin: No rashes Psychiatry:  Mood & affect appropriate.     Data Reviewed:   CBC: Recent Labs  Lab 08/26/23 2233 08/28/23 0249  WBC 13.0* 11.0*  NEUTROABS 11.0*  --   HGB 10.9* 9.8*  HCT 33.8* 30.7*  MCV 87.1 86.5  PLT 271 223   Basic Metabolic Panel: Recent Labs  Lab 08/26/23 2233 08/27/23 0744 08/28/23 0249  NA 136 138 138  K 4.4 4.3 3.9  CL 102 102 102  CO2 22 21* 23  GLUCOSE 257* 256* 136*  BUN 43* 42* 49*  CREATININE 1.93* 1.81* 2.23*  CALCIUM 8.9 8.7* 8.5*   GFR: Estimated Creatinine Clearance: 30.7 mL/min (A) (by C-G formula based on SCr of  2.23 mg/dL (H)). Liver Function Tests: Recent Labs  Lab 08/26/23 2233 08/28/23 0249  AST 19 12*  ALT 18 14  ALKPHOS 189* 125  BILITOT 1.2 0.8  PROT 8.1 7.3  ALBUMIN 3.4* 2.9*   No results for input(s): "LIPASE", "AMYLASE" in the last 168 hours. No results for input(s): "AMMONIA" in the last 168 hours. Coagulation Profile: No results for input(s): "INR", "PROTIME" in the last 168 hours. Cardiac Enzymes: Recent Labs  Lab 08/27/23 0338  CKTOTAL 45   BNP (last 3 results) No  results for input(s): "PROBNP" in the last 8760 hours. HbA1C: No results for input(s): "HGBA1C" in the last 72 hours. CBG: Recent Labs  Lab 08/27/23 1146 08/27/23 1820 08/27/23 2104 08/28/23 0610 08/28/23 1148  GLUCAP 284* 132* 106* 98 205*   Lipid Profile: No results for input(s): "CHOL", "HDL", "LDLCALC", "TRIG", "CHOLHDL", "LDLDIRECT" in the last 72 hours. Thyroid Function Tests: No results for input(s): "TSH", "T4TOTAL", "FREET4", "T3FREE", "THYROIDAB" in the last 72 hours. Anemia Panel: No results for input(s): "VITAMINB12", "FOLATE", "FERRITIN", "TIBC", "IRON", "RETICCTPCT" in the last 72 hours. Urine analysis:    Component Value Date/Time   COLORURINE YELLOW 08/27/2023 0423   APPEARANCEUR HAZY (A) 08/27/2023 0423   LABSPEC 1.012 08/27/2023 0423   PHURINE 5.0 08/27/2023 0423   GLUCOSEU 50 (A) 08/27/2023 0423   HGBUR SMALL (A) 08/27/2023 0423   BILIRUBINUR NEGATIVE 08/27/2023 0423   BILIRUBINUR negative 09/27/2022 1131   BILIRUBINUR negative 07/11/2021 0812   KETONESUR NEGATIVE 08/27/2023 0423   PROTEINUR >=300 (A) 08/27/2023 0423   UROBILINOGEN 0.2 09/27/2022 1131   NITRITE NEGATIVE 08/27/2023 0423   LEUKOCYTESUR NEGATIVE 08/27/2023 0423   Sepsis Labs: @LABRCNTIP (procalcitonin:4,lacticidven:4)  ) Recent Results (from the past 240 hours)  Resp panel by RT-PCR (RSV, Flu A&B, Covid) Anterior Nasal Swab     Status: None   Collection Time: 08/26/23 10:47 PM   Specimen: Anterior Nasal Swab  Result Value Ref Range Status   SARS Coronavirus 2 by RT PCR NEGATIVE NEGATIVE Final   Influenza A by PCR NEGATIVE NEGATIVE Final   Influenza B by PCR NEGATIVE NEGATIVE Final    Comment: (NOTE) The Xpert Xpress SARS-CoV-2/FLU/RSV plus assay is intended as an aid in the diagnosis of influenza from Nasopharyngeal swab specimens and should not be used as a sole basis for treatment. Nasal washings and aspirates are unacceptable for Xpert Xpress SARS-CoV-2/FLU/RSV testing.  Fact  Sheet for Patients: BloggerCourse.com  Fact Sheet for Healthcare Providers: SeriousBroker.it  This test is not yet approved or cleared by the [place] FDA and has been authorized for detection and/or diagnosis of SARS-CoV-2 by FDA under an Emergency Use Authorization (EUA). This EUA will remain in effect (meaning this test can be used) for the duration of the COVID-19 declaration under Section 564(b)(1) of the Act, 21 U.S.C. section 360bbb-3(b)(1), unless the authorization is terminated or revoked.     Resp Syncytial Virus by PCR NEGATIVE NEGATIVE Final    Comment: (NOTE) Fact Sheet for Patients: BloggerCourse.com  Fact Sheet for Healthcare Providers: SeriousBroker.it  This test is not yet approved or cleared by the [place] FDA and has been authorized for detection and/or diagnosis of SARS-CoV-2 by FDA under an Emergency Use Authorization (EUA). This EUA will remain in effect (meaning this test can be used) for the duration of the COVID-19 declaration under Section 564(b)(1) of the Act, 21 U.S.C. section 360bbb-3(b)(1), unless the authorization is terminated or revoked.  Performed at [facility], [address], [place], [place] [address]   Blood culture (routine x 2)     Status: None (Preliminary result)   Collection Time: 08/27/23  5:28 AM   Specimen: BLOOD  Result Value Ref Range Status   Specimen Description BLOOD LEFT ANTECUBITAL  Final   Special Requests   Final    BOTTLES DRAWN AEROBIC AND ANAEROBIC Blood Culture adequate volume   Culture   Final    NO GROWTH 1 DAY Performed at [facility], [address] [place], [place] [address]    Report Status PENDING  Incomplete  Blood culture (routine x 2)     Status: None (Preliminary result)   Collection Time: 08/27/23  5:51 AM   Specimen: BLOOD  Result Value Ref Range Status   Specimen  Description BLOOD BLOOD LEFT HAND  Final   Special Requests   Final    BOTTLES DRAWN AEROBIC AND ANAEROBIC Blood Culture adequate volume   Culture   Final    NO GROWTH 1 DAY Performed at [facility], [address] [place], [place] [address]    Report Status PENDING  Incomplete  Respiratory (~20 pathogens) panel by PCR     Status: Abnormal   Collection Time: 08/27/23  8:29 AM   Specimen: Nasopharyngeal Swab; Respiratory  Result Value Ref Range Status   Adenovirus NOT DETECTED NOT DETECTED Final   Coronavirus 229E NOT DETECTED NOT DETECTED Final    Comment: (NOTE) The Coronavirus on the Respiratory Panel, DOES NOT test for the novel  Coronavirus (2019 nCoV)    Coronavirus HKU1 NOT DETECTED NOT DETECTED Final   Coronavirus NL63 NOT DETECTED NOT DETECTED Final   Coronavirus OC43 DETECTED (A) NOT DETECTED Final   Metapneumovirus NOT DETECTED NOT DETECTED Final   Rhinovirus / Enterovirus NOT DETECTED NOT DETECTED Final   Influenza A NOT DETECTED NOT DETECTED Final   Influenza B NOT DETECTED NOT DETECTED Final   Parainfluenza Virus 1 NOT DETECTED NOT DETECTED Final   Parainfluenza Virus 2 NOT DETECTED NOT DETECTED Final   Parainfluenza Virus 3 NOT DETECTED NOT DETECTED Final   Parainfluenza Virus 4 NOT DETECTED NOT DETECTED Final   Respiratory Syncytial Virus NOT DETECTED NOT DETECTED Final   Bordetella pertussis NOT DETECTED NOT DETECTED Final   Bordetella Parapertussis NOT DETECTED NOT DETECTED Final   Chlamydophila pneumoniae NOT DETECTED NOT DETECTED Final   Mycoplasma pneumoniae NOT DETECTED NOT DETECTED Final    Comment: Performed at [facility], [address] [place], [place] [address]     Radiology Studies: ECHOCARDIOGRAM COMPLETE Result Date: 08/27/2023    ECHOCARDIOGRAM REPORT   Patient Name:   SUBRINA VECCHIARELLI Date of Exam: 08/27/2023 Medical Rec #:  [id number]      Height:       64.0 in Accession #:    [id number]     Weight:       220.0 lb Date of Birth:   05-Sep-1960      BSA:          2.037 m Patient Age:    62 years       BP:           144/60 mmHg Patient Gender: F              HR:           70 bpm. Exam Location:  Inpatient Procedure: 2D Echo, Color Doppler, Cardiac Doppler and Intracardiac  Opacification Agent Indications:    CHF  History:        Patient has prior history of Echocardiogram examinations, most                 recent 07/15/2023.  Sonographer:    Harriette Bouillon RDCS Referring Phys: [phone] JESSICA U VANN IMPRESSIONS  1. LVEF mildly reduced. Severe hypokinesis of the basal to mid posterior segments and basal inferior segment. Left ventricular ejection fraction, by estimation, is 40 to 45%. The left ventricle has mildly decreased function. The left ventricle demonstrates regional wall motion abnormalities (see scoring diagram/findings for description). Left ventricular diastolic function could not be evaluated.  2. Right ventricular systolic function was not well visualized. The right ventricular size is not well visualized. Tricuspid regurgitation signal is inadequate for assessing PA pressure.  3. Left atrial size was mildly dilated.  4. S/p mitraclip. Mild residual MR. The mitral valve has been repaired/replaced. Mild mitral valve regurgitation. No evidence of mitral stenosis. The mean mitral valve gradient is 4.3 mmHg with average heart rate of 69 bpm.  5. The aortic valve is grossly normal. Aortic valve regurgitation is not visualized. No aortic stenosis is present.  6. The inferior vena cava is normal in size with <50% respiratory variability, suggesting right atrial pressure of 8 mmHg. Comparison(s): Changes from prior study are noted. LVEF now mildly reduced with new WMA in the posterior/inferior segments as detailed above. FINDINGS  Left Ventricle: LVEF mildly reduced. Severe hypokinesis of the basal to mid posterior segments and basal inferior segment. Left ventricular ejection fraction, by estimation, is 40 to 45%. The left ventricle  has mildly decreased function. The left ventricle demonstrates regional wall motion abnormalities. The left ventricular internal cavity size was normal in size. There is no left ventricular hypertrophy. Left ventricular diastolic function could not be evaluated due to mitral valve repair. Left  ventricular diastolic function could not be evaluated.  LV Wall Scoring: The posterior wall and basal inferior segment are hypokinetic. Right Ventricle: The right ventricular size is not well visualized. Right vetricular wall thickness was not well visualized. Right ventricular systolic function was not well visualized. Tricuspid regurgitation signal is inadequate for assessing PA pressure. Left Atrium: Left atrial size was mildly dilated. Right Atrium: Right atrial size was not well visualized. Pericardium: There is no evidence of pericardial effusion. Mitral Valve: S/p mitraclip. Mild residual MR. The mitral valve has been repaired/replaced. Mild mitral valve regurgitation. There is a Mitra-Clip present in the mitral position. No evidence of mitral valve stenosis. The mean mitral valve gradient is 4.3  mmHg with average heart rate of 69 bpm. Tricuspid Valve: The tricuspid valve is grossly normal. Tricuspid valve regurgitation is trivial. No evidence of tricuspid stenosis. Aortic Valve: The aortic valve is grossly normal. Aortic valve regurgitation is not visualized. No aortic stenosis is present. Pulmonic Valve: The pulmonic valve was grossly normal. Pulmonic valve regurgitation is not visualized. No evidence of pulmonic stenosis. Aorta: The aortic root and ascending aorta are structurally normal, with no evidence of dilitation. Venous: The inferior vena cava is normal in size with less than 50% respiratory variability, suggesting right atrial pressure of 8 mmHg. IAS/Shunts: The interatrial septum was not well visualized.  LEFT VENTRICLE PLAX 2D LVIDd:         5.20 cm Diastology LVIDs:         3.70 cm LV e' medial:    2.72  cm/s LV PW:         1.10  cm LV E/e' medial:  56.6 LV IVS:        1.10 cm LV e' lateral:   3.15 cm/s                        LV E/e' lateral: 48.9  IVC IVC diam: 1.80 cm LEFT ATRIUM           Index LA diam:      3.80 cm 1.87 cm/m LA Vol (A4C): 73.0 ml 35.83 ml/m  AORTIC VALVE LVOT Vmax:   68.50 cm/s LVOT Vmean:  53.800 cm/s LVOT VTI:    0.159 m  AORTA Ao Asc diam: 3.20 cm MITRAL VALVE MV Area (PHT): 2.09 cm     SHUNTS MV Mean grad:  4.3 mmHg     Systemic VTI: 0.16 m MV Decel Time: 363 msec MV E velocity: 154.00 cm/s MV A velocity: 99.20 cm/s MV E/A ratio:  1.55 Lennie Odor MD Electronically signed by Lennie Odor MD Signature Date/Time: 08/27/2023/4:10:37 PM    Final    NM Pulmonary Perfusion Result Date: 08/27/2023 CLINICAL DATA:  Concern for pulmonary embolism. Positive D-dimer. Cough and congestion and respiratory illness. EXAM: NUCLEAR MEDICINE PERFUSION LUNG SCAN TECHNIQUE: Perfusion images were obtained in multiple projections after intravenous injection of radiopharmaceutical. Ventilation scans intentionally deferred if perfusion scan and chest x-ray adequate for interpretation during COVID 19 epidemic. RADIOPHARMACEUTICALS:  4 mCi Tc-31m MAA IV COMPARISON:  Chest CT dated 08/27/2023. FINDINGS: There is uniform perfusion uptake. No large, wedge-shaped, or segmental perfusion defect. IMPRESSION: No scintigraphic evidence of pulmonary embolism. Electronically Signed   By: Elgie Collard M.D.   On: 08/27/2023 10:51   CT CHEST WO CONTRAST Result Date: 08/27/2023 CLINICAL DATA:  Respiratory illness with nondiagnostic x-ray. EXAM: CT CHEST WITHOUT CONTRAST TECHNIQUE: Multidetector CT imaging of the chest was performed following the standard protocol without IV contrast. RADIATION DOSE REDUCTION: This exam was performed according to the departmental dose-optimization program which includes automated exposure control, adjustment of the mA and/or kV according to patient size and/or use of iterative  reconstruction technique. COMPARISON:  07/13/2023 FINDINGS: Cardiovascular: Cardiac enlargement. No pericardial effusion. Extensive atheromatous calcification of the coronaries. There is a chronic foreign body in the left lower lobe pulmonary arteries. Mediastinum/Nodes: Generous sized mediastinal lymph nodes remain generalized and size stable, pulmonary findings suggesting congestion. Lungs/Pleura: Generalized airway thickening with interlobular septal thickening to a mild degree. No consolidation. Mild lingular atelectasis. Upper Abdomen: Extensive arterial calcification. Musculoskeletal: Ordinary thoracic spondylosis. IMPRESSION: Cardiomegaly and interstitial edema.  No focal pneumonia. Electronically Signed   By: Tiburcio Pea M.D.   On: 08/27/2023 06:57   DG Chest 2 View Result Date: 08/26/2023 CLINICAL DATA:  Nasal congestion and cough. EXAM: CHEST - 2 VIEW COMPARISON:  July 13, 2023 FINDINGS: The cardiac silhouette is mildly enlarged and unchanged in size. Mild atelectatic changes are suspected within the left lung base. No pleural effusion or pneumothorax is identified. The visualized skeletal structures are unremarkable. IMPRESSION: Stable cardiomegaly with left basilar atelectasis. Electronically Signed   By: Aram Candela M.D.   On: 08/26/2023 23:12     Scheduled Meds:  allopurinol  50 mg Oral Daily   aspirin EC  81 mg Oral Daily   busPIRone  10 mg Oral TID   carvedilol  9.375 mg Oral BID   enoxaparin (LOVENOX) injection  40 mg Subcutaneous Q24H   ezetimibe  10 mg Oral QPM   furosemide  40 mg Intravenous BID   gabapentin  100 mg Oral BID   insulin aspart  0-15 Units Subcutaneous TID WC   insulin aspart  0-5 Units Subcutaneous QHS   insulin glargine-yfgn  30 Units Subcutaneous Daily   isosorbide mononitrate  30 mg Oral Daily   latanoprost  1 drop Both Eyes QHS   magnesium oxide  800 mg Oral Daily   pantoprazole  40 mg Oral Daily   ticagrelor  90 mg Oral BID   Continuous  Infusions:   LOS: 1 day    Time spent:    Zannie Cove, MD [facility]   08/28/2023, 12:32 PM

## 2023-08-28 NOTE — Progress Notes (Signed)
   08/28/23 2218  BiPAP/CPAP/SIPAP  BiPAP/CPAP/SIPAP Pt Type Adult  Reason BIPAP/CPAP not in use Non-compliant (pt refused)  BiPAP/CPAP /SiPAP Vitals  Pulse Rate 62  Resp 18  SpO2 97 %  Bilateral Breath Sounds Diminished

## 2023-08-28 NOTE — Plan of Care (Signed)

## 2023-08-28 NOTE — Inpatient Diabetes Management (Signed)
Inpatient Diabetes Program Recommendations  AACE/ADA: New Consensus Statement on Inpatient Glycemic Control (2015)  Target Ranges:  Prepandial:   less than 140 mg/dL      Peak postprandial:   less than 180 mg/dL (1-2 hours)      Critically ill patients:  140 - 180 mg/dL   Lab Results  Component Value Date   GLUCAP 98 08/28/2023   HGBA1C 9.0 (H) 05/14/2023    Review of Glycemic Control  Latest Reference Range & Units 08/27/23 11:46 08/27/23 18:20 08/27/23 21:04 08/28/23 06:10  Glucose-Capillary 70 - 99 mg/dL 161 (H) 096 (H) 045 (H) 98   Diabetes history: DM  Outpatient Diabetes medications:  FS libre2 Novolog 2-10 units before meals  Lantus 33-35 units daily Current orders for Inpatient glycemic control:  Novolog 0-15 units tid with meals and HS Semglee 30 units daily Inpatient Diabetes Program Recommendations:   Agree with current orders.  Will follow.   Thanks  Lorenza Cambridge, RN, BC-ADM Inpatient Diabetes Coordinator Pager [phone]  (8a-5p)

## 2023-08-29 ENCOUNTER — Other Ambulatory Visit (HOSPITAL_COMMUNITY): Payer: Self-pay

## 2023-08-29 ENCOUNTER — Ambulatory Visit: Payer: PPO | Admitting: Podiatry

## 2023-08-29 DIAGNOSIS — R0602 Shortness of breath: Secondary | ICD-10-CM | POA: Diagnosis not present

## 2023-08-29 LAB — GLUCOSE, CAPILLARY: Glucose-Capillary: 95 mg/dL (ref 70–99)

## 2023-08-29 LAB — CBC
HCT: 30.2 % — ABNORMAL LOW (ref 36.0–46.0)
Hemoglobin: 9.7 g/dL — ABNORMAL LOW (ref 12.0–15.0)
MCH: 27.7 pg (ref 26.0–34.0)
MCHC: 32.1 g/dL (ref 30.0–36.0)
MCV: 86.3 fL (ref 80.0–100.0)
Platelets: 234 10*3/uL (ref 150–400)
RBC: 3.5 MIL/uL — ABNORMAL LOW (ref 3.87–5.11)
RDW: 14.6 % (ref 11.5–15.5)
WBC: 11.1 10*3/uL — ABNORMAL HIGH (ref 4.0–10.5)
nRBC: 0 % (ref 0.0–0.2)

## 2023-08-29 LAB — BASIC METABOLIC PANEL
Anion gap: 11 (ref 5–15)
BUN: 50 mg/dL — ABNORMAL HIGH (ref 6–20)
CO2: 28 mmol/L (ref 22–32)
Calcium: 9 mg/dL (ref 8.9–10.3)
Chloride: 100 mmol/L (ref 98–111)
Creatinine, Ser: 2.38 mg/dL — ABNORMAL HIGH (ref 0.44–1.00)
GFR, Estimated: 23 mL/min — ABNORMAL LOW (ref >60)
Glucose, Bld: 103 mg/dL — ABNORMAL HIGH (ref 70–99)
Potassium: 3.9 mmol/L (ref 3.5–5.1)
Sodium: 139 mmol/L (ref 135–145)

## 2023-08-29 MED ORDER — LANTUS SOLOSTAR 100 UNIT/ML ~~LOC~~ SOPN: 33.0000 [IU] | PEN_INJECTOR | Freq: Every day | SUBCUTANEOUS | Status: DC

## 2023-08-29 MED ORDER — TORSEMIDE 20 MG PO TABS: ORAL_TABLET | ORAL | Status: DC

## 2023-08-29 MED ORDER — POTASSIUM CHLORIDE CRYS ER 20 MEQ PO TBCR: 20.0000 meq | EXTENDED_RELEASE_TABLET | Freq: Every day | ORAL | Status: DC

## 2023-08-29 MED ORDER — GUAIFENESIN ER 600 MG PO TB12
600.0000 mg | ORAL_TABLET | Freq: Two times a day (BID) | ORAL | 0 refills | Status: AC
  Filled 2023-08-29: qty 10, 5d supply, fill #0

## 2023-08-29 NOTE — TOC Transition Note (Addendum)
Transition of Care [facility]) - Discharge Note   Patient Details  Name: Dana [name] MRN: [id number] Date of Birth: [birth year]  Transition of [facility]) CM/SW Contact:  Leone Haven, RN Phone Number: 08/29/2023, 10:01 AM   Clinical Narrative:    For dc today, best friend will transport her home. She has no needs. TOC pharmacy is filling med for her.   Final next level of care: Home/Self Care Barriers to Discharge: No Barriers Identified   Patient Goals and CMS Choice Patient states their goals for this hospitalization and ongoing recovery are:: return home   Choice offered to / list presented to : NA      Discharge Placement                       Discharge Plan and Services Additional resources added to the After Visit Summary for   In-house Referral: NA Discharge Planning Services: CM Consult Post Acute Care Choice: Durable Medical Equipment (cane, shower chair, cpap machine, home oxygen 2 liters at night with [facility])          DME Arranged: N/A DME Agency: NA       HH Arranged: NA          Social Drivers of Health (SDOH) Interventions SDOH Screenings   Food Insecurity: No Food Insecurity (08/27/2023)  Housing: Low Risk  (08/27/2023)  Transportation Needs: No Transportation Needs (08/27/2023)  Utilities: Not At Risk (08/27/2023)  Alcohol Screen: Low Risk  (07/25/2022)  Depression (PHQ2-9): Medium Risk (07/29/2023)  Financial Resource Strain: Low Risk  (07/01/2023)  Physical Activity: Insufficiently Active (07/01/2023)  Social Connections: Moderately Integrated (07/25/2022)  Stress: Stress Concern Present (07/01/2023)  Tobacco Use: Medium Risk (08/27/2023)  Health Literacy: Adequate Health Literacy (01/30/2023)     Readmission Risk Interventions    07/19/2023    3:45 PM 12/16/2022   11:54 AM 05/19/2021    1:11 PM  Readmission Risk Prevention Plan  Transportation Screening Complete Complete Complete  Medication Review (RN Care  Manager) Referral to Pharmacy Referral to Pharmacy Complete  PCP or Specialist appointment within 3-5 days of discharge  Complete Complete  HRI or Home Care Consult Complete Complete Complete  SW Recovery Care/Counseling Consult Complete Complete Complete  Palliative Care Screening Not Applicable Not Applicable Not Applicable  Skilled Nursing Facility Not Applicable Not Applicable Not Applicable

## 2023-08-29 NOTE — TOC Initial Note (Addendum)
Transition of Care [facility]) - Initial/Assessment Note    Patient Details  Name: Dana [name] MRN: [id number] Date of Birth: May 12, 1961  Transition of Care [facility]) CM/SW Contact:    Leone Haven, RN Phone Number: 08/29/2023, 9:58 AM  Clinical Narrative:                 From home with spouse and mother,  has PCP and insurance on file, states has no HH services in place at this time, has cane, shower chair, cpap machine and home oxygen 2 liters at night with [facility] at home.  States her best friend will transport them home at [employer] and family is support system, states gets medications [facility].  Pta self ambulatory with cane.  Expected Discharge Plan: Home/Self Care Barriers to Discharge: No Barriers Identified   Patient Goals and CMS Choice Patient states their goals for this hospitalization and ongoing recovery are:: return home   Choice offered to / list presented to : NA      Expected Discharge Plan and Services In-house Referral: NA Discharge Planning Services: CM Consult Post Acute Care Choice: Durable Medical Equipment (cane, shower chair, cpap machine, home oxygen 2 liters at night with [facility]) Living arrangements for the past 2 months: Single Family Home Expected Discharge Date: 08/29/23               DME Arranged: N/A DME Agency: NA       HH Arranged: NA          Prior Living Arrangements/Services Living arrangements for the past 2 months: Single Family Home Lives with:: Spouse, Parents (spouse, and her mother) Patient language and need for interpreter reviewed:: Yes Do you feel safe going back to the place where you live?: Yes      Need for Family Participation in Patient Care: No (Comment) Care giver support system in place?: Yes (comment) Current home services: DME (cane, shower chair, cpap machine, home oxygen with [facility] 2 liters at night) Criminal Activity/Legal Involvement Pertinent to  Current Situation/Hospitalization: No - Comment as needed  Activities of Daily Living   ADL Screening (condition at time of admission) Independently performs ADLs?: Yes (appropriate for developmental age) Is the patient deaf or have difficulty hearing?: No Does the patient have difficulty seeing, even when wearing glasses/contacts?: No Does the patient have difficulty concentrating, remembering, or making decisions?: No  Permission Sought/Granted Permission sought to share information with : Case Manager Permission granted to share information with : Yes, Verbal Permission Granted              Emotional Assessment Appearance:: Appears stated age Attitude/Demeanor/Rapport: Engaged Affect (typically observed): Appropriate Orientation: : Oriented to Self, Oriented to Place, Oriented to  Time, Oriented to Situation Alcohol / Substance Use: Not Applicable Psych Involvement: No (comment)  Admission diagnosis:  Nasal congestion [R09.81] SOB (shortness of breath) [R06.02] Positive D dimer [R79.89] Elevated troponin [R79.89] Viral URI with cough [J06.9] Acute on chronic congestive heart failure, unspecified heart failure type (HCC) [I50.9] Patient Active Problem List   Diagnosis Date Noted   Elevated troponin 08/28/2023   Abnormal echocardiogram 08/28/2023   Coronary artery disease involving native coronary artery of native heart without angina pectoris 08/28/2023   SOB (shortness of breath) 08/27/2023   URI with cough and congestion 08/22/2023   Hospital discharge follow-up 07/29/2023   Coronary artery disease involving native coronary artery of native heart with angina pectoris (HCC) 07/20/2023  Acute on chronic heart failure with preserved ejection fraction (HCC) 07/20/2023   Acute respiratory failure with hypoxia (HCC) 07/20/2023   Angina pectoris (HCC) 07/13/2023   Hyperglycemia 07/13/2023   Presence of heart assist device (HCC) 07/13/2023   Moderate recurrent major  depression (HCC) 07/13/2023   Acute pain of right knee 07/13/2023   History of amputation of hallux (HCC) 05/24/2023   Bilateral hand pain 05/14/2023   Encounter for immunization 04/04/2023   Acute upper respiratory infection 03/03/2023   Contrast-induced nephropathy 02/20/2023   Chest pain 12/07/2022   Dark urine 09/22/2022   Unstable angina (HCC) 09/07/2022   Chronic idiopathic constipation 09/01/2022   Hyperphosphatemia 01/09/2022   Genetic testing 11/07/2021   Family history of breast cancer 10/23/2021   GAD (generalized anxiety disorder) 07/11/2021   Mixed hyperlipidemia 07/11/2021   S/P mitral valve clip implantation 05/18/2021   History of cardiac arrest 02/06/2021   Impaired mobility and ADLs 01/20/2021   Left-sided weakness 01/20/2021   Drug-induced myopathy 01/03/2021   Secondary hyperparathyroidism of renal origin (HCC) 11/18/2020   Vitamin D insufficiency 11/18/2020   CKD stage 4 due to type 1 diabetes mellitus (HCC) 10/13/2020   NSTEMI (non-ST elevated myocardial infarction) (HCC) 09/03/2020   Contrast dye induced nephropathy, possible 09/03/2020   OSA on CPAP 09/03/2020   Insulin dependent type 2 diabetes mellitus (HCC) 09/03/2020   Need for COVID-19 vaccine 06/28/2020   Chronic respiratory failure with hypoxia (HCC) 03/23/2020   Chronic gout without tophus 03/23/2020   Pain in both hands 03/23/2020   Uncomplicated opioid dependence (HCC) 03/23/2020   Depression, major, recurrent, mild (HCC) 12/21/2019   Hypomagnesemia 12/21/2019   Lumbar back pain 10/05/2019   Hypertensive heart and renal disease with congestive heart failure (HCC) 09/18/2019   Demand ischemia (HCC)    Chronic heart failure with preserved ejection fraction (HFpEF) (HCC) 11/07/2017   Class 2 severe obesity due to excess calories with serious comorbidity and body mass index (BMI) of 39.0 to 39.9 in adult (HCC) 11/07/2017   Hyperlipidemia LDL goal <70 12/14/2016   Anemia in CKD (chronic kidney  disease) 03/23/2016   Chronic pain syndrome 03/23/2016   Coronary artery disease of native artery of native heart with stable angina pectoris (HCC) 03/23/2016   GERD without esophagitis 03/23/2016   On home oxygen therapy 03/23/2016   S/P ablation of atrial fibrillation 10/04/2015   Acute renal failure superimposed on stage 3b chronic kidney disease (HCC) 08/30/2015   Diabetic polyneuropathy associated with type 2 diabetes mellitus (HCC) 04/02/2014   PCP:  Blane Ohara, MD Pharmacy:   [facility] [address] [place] [address] Phone: [phone] Fax: [phone]  [facility] [address] [place] [facility] [address] [place] [address] Phone: [phone] Fax: [phone]  [facility] [address] [place] [address] Phone: [phone] Fax: [phone]     Social Drivers of Health (SDOH) Social History: SDOH Screenings   Food Insecurity: No Food Insecurity (08/27/2023)  Housing: Low Risk  (08/27/2023)  Transportation Needs: No Transportation Needs (08/27/2023)  Utilities: Not At Risk (08/27/2023)  Alcohol Screen: Low Risk  (07/25/2022)  Depression (PHQ2-9): Medium Risk (07/29/2023)  Financial Resource Strain: Low Risk  (07/01/2023)  Physical Activity: Insufficiently Active (07/01/2023)  Social Connections: Moderately Integrated (07/25/2022)  Stress: Stress Concern Present (07/01/2023)  Tobacco Use: Medium Risk (08/27/2023)  Health Literacy: Adequate Health Literacy (01/30/2023)   SDOH Interventions:  Readmission Risk Interventions    07/19/2023    3:45 PM 12/16/2022   11:54 AM 05/19/2021    1:11 PM  Readmission Risk Prevention Plan  Transportation Screening Complete Complete Complete  Medication Review (RN Care Manager) Referral to Pharmacy Referral to Pharmacy Complete  PCP or Specialist appointment within 3-5 days of discharge  Complete Complete  HRI or Home  Care Consult Complete Complete Complete  SW Recovery Care/Counseling Consult Complete Complete Complete  Palliative Care Screening Not Applicable Not Applicable Not Applicable  Skilled Nursing Facility Not Applicable Not Applicable Not Applicable

## 2023-08-29 NOTE — Discharge Summary (Addendum)
Physician Discharge Summary  Dana [name] ZOX:[id number] DOB: [birth year] DOA: 08/26/2023  PCP: Blane Ohara, MD  Admit date: 08/26/2023 Discharge date: 08/29/2023  Time spent: 45 minutes  Recommendations for Outpatient Follow-up:  Advanced heart failure clinic in 2 to 3 weeks PCP in 1 week, please check BMP at follow-up   Discharge Diagnoses:  Principal Problem: URI, bronchitis Acute on chronic systolic and diastolic CHF CKD 4   Elevated troponin   Abnormal echocardiogram   Coronary artery disease involving native coronary artery of native heart without angina pectoris   Discharge Condition: Improved  Diet recommendation: Low-sodium, diabetic  Filed Weights   08/27/23 0249 08/27/23 1730 08/28/23 0608  Weight: 99.8 kg 99.5 kg 99 kg    History of present illness:   62/M w diastolic CHF, obesity, OSA, PEA arrest, COPD, CAD with recent PCI of LAD in 12/24 presented to the ED with nasal congestion, cough, fevers, headache along with shortness of breath In the ED creatinine was 1.9, BNP 511, WBC 13, UA abnormal, noncontrast CT chest with cardiomegaly and interstitial edema, nuclear medicine scan low probability, RVP positive for coronavirus [facility] Course:   URI, coronavirus OC43 -Supportive care, Mucinex, flutter valve   Acute on chronic systolic and diastolic CHF -Echo with EF down to 40-45% with wall motion abnormality, EF down from ECHO 07/15/23 -Cardiac catheterization/PCI 07/18/2023: Mid LAD to Dist LAD lesion is 60% stenosed., Mid LAD lesion is 90% stenosed, Mid Cx to Dist Cx lesion is 100% stenosed,  1st Mrg-2 lesion is 90% stenosed, Non-stenotic Mid Cx lesion was previously treated, balloon angioplasty of prox LAD stented segment -Diuresed with IV Lasix, volume status has improved,  -GDMT limited by CKD -Seen by cardiology who felt that the new wall motion abnormality on current echo was poor quality and unlikely to be an acute change, in the absence of symptoms  no further workup recommended -Avoid SGLT2i, history of UTIs -Resume torsemide 60 Mg daily per home regimen -Follow-up in CHF clinic   CAD -See discussion above regarding angioplasty of proximal LAD stented segment -Continue aspirin, Brilinta, Coreg, Zetia, Imdur   AKI on CKD 4 -Baseline creatinine around 2, recent history of contrast-induced nephropathy -Creatinine now in the 2.2-2.3 range -Torsemide resumed at discharge, check BMP in 1 week   Chronic anemia Monitor   OSA Resume CPAP   COPD Stable, monitor   Abnormal UA No symptoms of UTI at this time  Consultations: Cardiology  Discharge Exam: Vitals:   08/29/23 0008 08/29/23 0602  BP: (!) 126/48 (!) 144/49  Pulse: (!) 58 67  Resp: 18 17  Temp: 97.8 F (36.6 C) 97.7 F (36.5 C)  SpO2: 95% 100%   Gen: Awake, Alert, Oriented X 3,  HEENT: no JVD Lungs: Good air movement bilaterally, CTAB CVS: S1S2/RRR Abd: soft, Non tender, non distended, BS present Extremities: No edema Skin: no new rashes on exposed skin   Discharge Instructions   Discharge Instructions     Diet - low sodium heart healthy   Complete by: As directed    Diet Carb Modified   Complete by: As directed    Increase activity slowly   Complete by: As directed       Allergies as of 08/29/2023       Reactions   Naproxen Hives, Rash   Shellfish-derived Products Hives, Swelling, Other (See Comments)   Angioedema   Strawberry (diagnostic) Hives   Celecoxib Other (See Comments)   Stomach pain   Glucosamine Hives,  Swelling, Other (See Comments)   Angioedema   Iron Hives, Other (See Comments)   IV iron transfusion - hives - Feb 2022 hospitalization Patient said she CAN tolerate if given Benadryl first   Metoclopramide Other (See Comments)   Facial twitching and stuttering   Metolazone Other (See Comments)   Acute renal failure   Statins Other (See Comments)   Muscle pain        Medication List     TAKE these medications     acetaminophen 500 MG tablet Commonly known as: TYLENOL Take 1,000 mg by mouth every 6 (six) hours as needed for mild pain (pain score 1-3) or headache.   allopurinol 100 MG tablet Commonly known as: ZYLOPRIM Take 0.5 tablets (50 mg total) by mouth daily.   Aspirin Low Dose 81 MG tablet Generic drug: aspirin EC Take 1 tablet (81 mg total) by mouth daily. Swallow whole.   BD Pen Needle Nano 2nd Gen 32G X 4 MM Misc Generic drug: Insulin Pen Needle Inject 1 each into the skin in the morning, at noon, in the evening, and at bedtime.   busPIRone 10 MG tablet Commonly known as: BUSPAR Take 1 tablet (10 mg total) by mouth 3 (three) times daily.   carvedilol 6.25 MG tablet Commonly known as: COREG Take 1.5 tablets (9.375 mg total) by mouth 2 (two) times daily.   EPINEPHrine 0.3 mg/0.3 mL Soaj injection Commonly known as: EPI-PEN Use as directed for life-threatening allergic reaction.   ezetimibe 10 MG tablet Commonly known as: ZETIA Take 1 tablet (10 mg total) by mouth every evening.   FISH OIL PO Take 1 capsule by mouth daily.   FreeStyle Libre 2 Sensor Misc Inject 1 device into skin every 14 (fourteen) days.   gabapentin 100 MG capsule Commonly known as: NEURONTIN Take 1 capsule (100 mg total) by mouth 2 (two) times daily.   guaiFENesin 600 MG 12 hr tablet Commonly known as: MUCINEX Take 1 tablet (600 mg total) by mouth 2 (two) times daily for 5 days.   Gvoke HypoPen 2-Pack 0.5 MG/0.1ML Soaj Generic drug: Glucagon Inject 0.5 mg into the skin daily as needed.   HYDROcodone-acetaminophen 7.5-325 MG tablet Commonly known as: NORCO Take 1 tablet by mouth 3 (three) times daily as needed.   Icy Hot Extra Strength 10-30 % Crea Apply 1 Application topically as needed (bilateral hips and knees).   isosorbide mononitrate 30 MG 24 hr tablet Commonly known as: IMDUR Take 1 tablet (30 mg total) by mouth daily.   Lantus SoloStar 100 UNIT/ML Solostar Pen Generic drug:  insulin glargine Inject 33-35 Units into the skin daily.   latanoprost 0.005 % ophthalmic solution Commonly known as: XALATAN Place 1 drop into both eyes at bedtime.   Linzess 145 MCG Caps capsule Generic drug: linaclotide Take 1 capsule (145 mcg total) by mouth daily before breakfast. What changed:  when to take this reasons to take this   LORazepam 0.5 MG tablet Commonly known as: ATIVAN Take 1 tablet (0.5 mg total) by mouth daily as needed for anxiety.   magnesium oxide 400 MG tablet Commonly known as: MAG-OX Take 2 tablets (800 mg total) by mouth daily. Take 2 (400 mg) daily=800 mg   nitroGLYCERIN 0.4 MG SL tablet Commonly known as: NITROSTAT DISSOLVE ONE TABLET UNDER THE TONGUE EVERY 5 MINUTES AS NEEDED FOR CHEST PAIN.  DO NOT EXCEED A TOTAL OF 3 DOSES IN 15 MINUTES   NovoLOG FlexPen 100 UNIT/ML FlexPen Generic drug: insulin aspart  Inject 2 - 10 Units before meals based on SSI.   OXYGEN Inhale 2 L/min into the lungs at bedtime.   pantoprazole 40 MG tablet Commonly known as: PROTONIX Take 1 tablet (40 mg total) by mouth daily. What changed: when to take this   potassium chloride SA 20 MEQ tablet Commonly known as: KLOR-CON M Take 1 tablet (20 mEq total) by mouth daily. Start taking on: August 30, 2023 What changed: how much to take   Probiotic Caps Take 1 capsule by mouth daily.   promethazine 25 MG tablet Commonly known as: PHENERGAN Take 1 tablet (25 mg total) by mouth daily as needed.   Repatha SureClick 140 MG/ML Soaj Generic drug: Evolocumab Inject 140 mg into the skin every 14 (fourteen) days.   ticagrelor 90 MG Tabs tablet Commonly known as: BRILINTA Take 1 tablet (90 mg total) by mouth 2 (two) times daily.   torsemide 20 MG tablet Commonly known as: DEMADEX 3 tablets (60 mg total) daily. Start taking on: August 30, 2023 What changed: See the new instructions.       Allergies  Allergen Reactions   Naproxen Hives and Rash    Shellfish-Derived Products Hives, Swelling and Other (See Comments)    Angioedema    Strawberry (Diagnostic) Hives   Celecoxib Other (See Comments)    Stomach pain   Glucosamine Hives, Swelling and Other (See Comments)    Angioedema    Iron Hives and Other (See Comments)    IV iron transfusion - hives - Feb 2022 hospitalization  Patient said she CAN tolerate if given Benadryl first   Metoclopramide Other (See Comments)    Facial twitching and stuttering   Metolazone Other (See Comments)    Acute renal failure   Statins Other (See Comments)    Muscle pain      The results of significant diagnostics from this hospitalization (including imaging, microbiology, ancillary and laboratory) are listed below for reference.    Significant Diagnostic Studies: ECHOCARDIOGRAM COMPLETE Result Date: 08/27/2023    ECHOCARDIOGRAM REPORT   Patient Name:   Dana [name] Date of Exam: 08/27/2023 Medical Rec #:  [id number]      Height:       64.0 in Accession #:    [id number]     Weight:       220.0 lb Date of Birth:  03-Feb-1961      BSA:          2.037 m Patient Age:    62 years       BP:           144/60 mmHg Patient Gender: F              HR:           70 bpm. Exam Location:  Inpatient Procedure: 2D Echo, Color Doppler, Cardiac Doppler and Intracardiac            Opacification Agent Indications:    CHF  History:        Patient has prior history of Echocardiogram examinations, most                 recent 07/15/2023.  Sonographer:    Harriette Bouillon RDCS Referring Phys: [phone] JESSICA U VANN IMPRESSIONS  1. LVEF mildly reduced. Severe hypokinesis of the basal to mid posterior segments and basal inferior segment. Left ventricular ejection fraction, by estimation, is 40 to 45%. The left ventricle has mildly decreased function. The left ventricle demonstrates regional wall  motion abnormalities (see scoring diagram/findings for description). Left ventricular diastolic function could not be evaluated.  2. Right  ventricular systolic function was not well visualized. The right ventricular size is not well visualized. Tricuspid regurgitation signal is inadequate for assessing PA pressure.  3. Left atrial size was mildly dilated.  4. S/p mitraclip. Mild residual MR. The mitral valve has been repaired/replaced. Mild mitral valve regurgitation. No evidence of mitral stenosis. The mean mitral valve gradient is 4.3 mmHg with average heart rate of 69 bpm.  5. The aortic valve is grossly normal. Aortic valve regurgitation is not visualized. No aortic stenosis is present.  6. The inferior vena cava is normal in size with <50% respiratory variability, suggesting right atrial pressure of 8 mmHg. Comparison(s): Changes from prior study are noted. LVEF now mildly reduced with new WMA in the posterior/inferior segments as detailed above. FINDINGS  Left Ventricle: LVEF mildly reduced. Severe hypokinesis of the basal to mid posterior segments and basal inferior segment. Left ventricular ejection fraction, by estimation, is 40 to 45%. The left ventricle has mildly decreased function. The left ventricle demonstrates regional wall motion abnormalities. The left ventricular internal cavity size was normal in size. There is no left ventricular hypertrophy. Left ventricular diastolic function could not be evaluated due to mitral valve repair. Left  ventricular diastolic function could not be evaluated.  LV Wall Scoring: The posterior wall and basal inferior segment are hypokinetic. Right Ventricle: The right ventricular size is not well visualized. Right vetricular wall thickness was not well visualized. Right ventricular systolic function was not well visualized. Tricuspid regurgitation signal is inadequate for assessing PA pressure. Left Atrium: Left atrial size was mildly dilated. Right Atrium: Right atrial size was not well visualized. Pericardium: There is no evidence of pericardial effusion. Mitral Valve: S/p mitraclip. Mild residual MR.  The mitral valve has been repaired/replaced. Mild mitral valve regurgitation. There is a Mitra-Clip present in the mitral position. No evidence of mitral valve stenosis. The mean mitral valve gradient is 4.3  mmHg with average heart rate of 69 bpm. Tricuspid Valve: The tricuspid valve is grossly normal. Tricuspid valve regurgitation is trivial. No evidence of tricuspid stenosis. Aortic Valve: The aortic valve is grossly normal. Aortic valve regurgitation is not visualized. No aortic stenosis is present. Pulmonic Valve: The pulmonic valve was grossly normal. Pulmonic valve regurgitation is not visualized. No evidence of pulmonic stenosis. Aorta: The aortic root and ascending aorta are structurally normal, with no evidence of dilitation. Venous: The inferior vena cava is normal in size with less than 50% respiratory variability, suggesting right atrial pressure of 8 mmHg. IAS/Shunts: The interatrial septum was not well visualized.  LEFT VENTRICLE PLAX 2D LVIDd:         5.20 cm Diastology LVIDs:         3.70 cm LV e' medial:    2.72 cm/s LV PW:         1.10 cm LV E/e' medial:  56.6 LV IVS:        1.10 cm LV e' lateral:   3.15 cm/s                        LV E/e' lateral: 48.9  IVC IVC diam: 1.80 cm LEFT ATRIUM           Index LA diam:      3.80 cm 1.87 cm/m LA Vol (A4C): 73.0 ml 35.83 ml/m  AORTIC VALVE LVOT Vmax:   68.50 cm/s LVOT  Vmean:  53.800 cm/s LVOT VTI:    0.159 m  AORTA Ao Asc diam: 3.20 cm MITRAL VALVE MV Area (PHT): 2.09 cm     SHUNTS MV Mean grad:  4.3 mmHg     Systemic VTI: 0.16 m MV Decel Time: 363 msec MV E velocity: 154.00 cm/s MV A velocity: 99.20 cm/s MV E/A ratio:  1.55 Lennie Odor MD Electronically signed by Lennie Odor MD Signature Date/Time: 08/27/2023/4:10:37 PM    Final    NM Pulmonary Perfusion Result Date: 08/27/2023 CLINICAL DATA:  Concern for pulmonary embolism. Positive D-dimer. Cough and congestion and respiratory illness. EXAM: NUCLEAR MEDICINE PERFUSION LUNG SCAN TECHNIQUE:  Perfusion images were obtained in multiple projections after intravenous injection of radiopharmaceutical. Ventilation scans intentionally deferred if perfusion scan and chest x-ray adequate for interpretation during COVID 19 epidemic. RADIOPHARMACEUTICALS:  4 mCi Tc-20m MAA IV COMPARISON:  Chest CT dated 08/27/2023. FINDINGS: There is uniform perfusion uptake. No large, wedge-shaped, or segmental perfusion defect. IMPRESSION: No scintigraphic evidence of pulmonary embolism. Electronically Signed   By: Elgie Collard M.D.   On: 08/27/2023 10:51   CT CHEST WO CONTRAST Result Date: 08/27/2023 CLINICAL DATA:  Respiratory illness with nondiagnostic x-ray. EXAM: CT CHEST WITHOUT CONTRAST TECHNIQUE: Multidetector CT imaging of the chest was performed following the standard protocol without IV contrast. RADIATION DOSE REDUCTION: This exam was performed according to the departmental dose-optimization program which includes automated exposure control, adjustment of the mA and/or kV according to patient size and/or use of iterative reconstruction technique. COMPARISON:  07/13/2023 FINDINGS: Cardiovascular: Cardiac enlargement. No pericardial effusion. Extensive atheromatous calcification of the coronaries. There is a chronic foreign body in the left lower lobe pulmonary arteries. Mediastinum/Nodes: Generous sized mediastinal lymph nodes remain generalized and size stable, pulmonary findings suggesting congestion. Lungs/Pleura: Generalized airway thickening with interlobular septal thickening to a mild degree. No consolidation. Mild lingular atelectasis. Upper Abdomen: Extensive arterial calcification. Musculoskeletal: Ordinary thoracic spondylosis. IMPRESSION: Cardiomegaly and interstitial edema.  No focal pneumonia. Electronically Signed   By: Tiburcio Pea M.D.   On: 08/27/2023 06:57   DG Chest 2 View Result Date: 08/26/2023 CLINICAL DATA:  Nasal congestion and cough. EXAM: CHEST - 2 VIEW COMPARISON:  July 13, 2023 FINDINGS: The cardiac silhouette is mildly enlarged and unchanged in size. Mild atelectatic changes are suspected within the left lung base. No pleural effusion or pneumothorax is identified. The visualized skeletal structures are unremarkable. IMPRESSION: Stable cardiomegaly with left basilar atelectasis. Electronically Signed   By: Aram Candela M.D.   On: 08/26/2023 23:12    Microbiology: Recent Results (from the past 240 hours)  Resp panel by RT-PCR (RSV, Flu A&B, Covid) Anterior Nasal Swab     Status: None   Collection Time: 08/26/23 10:47 PM   Specimen: Anterior Nasal Swab  Result Value Ref Range Status   SARS Coronavirus 2 by RT PCR NEGATIVE NEGATIVE Final   Influenza A by PCR NEGATIVE NEGATIVE Final   Influenza B by PCR NEGATIVE NEGATIVE Final    Comment: (NOTE) The Xpert Xpress SARS-CoV-2/FLU/RSV plus assay is intended as an aid in the diagnosis of influenza from Nasopharyngeal swab specimens and should not be used as a sole basis for treatment. Nasal washings and aspirates are unacceptable for Xpert Xpress SARS-CoV-2/FLU/RSV testing.  Fact Sheet for Patients: BloggerCourse.com  Fact Sheet for Healthcare Providers: SeriousBroker.it  This test is not yet approved or cleared by the [place] FDA and has been authorized for detection and/or diagnosis of SARS-CoV-2 by FDA under  an Emergency Use Authorization (EUA). This EUA will remain in effect (meaning this test can be used) for the duration of the COVID-19 declaration under Section 564(b)(1) of the Act, 21 U.S.C. section 360bbb-3(b)(1), unless the authorization is terminated or revoked.     Resp Syncytial Virus by PCR NEGATIVE NEGATIVE Final    Comment: (NOTE) Fact Sheet for Patients: BloggerCourse.com  Fact Sheet for Healthcare Providers: SeriousBroker.it  This test is not yet approved or cleared by the  [place] FDA and has been authorized for detection and/or diagnosis of SARS-CoV-2 by FDA under an Emergency Use Authorization (EUA). This EUA will remain in effect (meaning this test can be used) for the duration of the COVID-19 declaration under Section 564(b)(1) of the Act, 21 U.S.C. section 360bbb-3(b)(1), unless the authorization is terminated or revoked.  Performed at [facility], [address] [place], [place] [address]   Blood culture (routine x 2)     Status: None (Preliminary result)   Collection Time: 08/27/23  5:28 AM   Specimen: BLOOD  Result Value Ref Range Status   Specimen Description BLOOD LEFT ANTECUBITAL  Final   Special Requests   Final    BOTTLES DRAWN AEROBIC AND ANAEROBIC Blood Culture adequate volume   Culture   Final    NO GROWTH 2 DAYS Performed at [facility], [address], [place], [place] [address]    Report Status PENDING  Incomplete  Blood culture (routine x 2)     Status: None (Preliminary result)   Collection Time: 08/27/23  5:51 AM   Specimen: BLOOD  Result Value Ref Range Status   Specimen Description BLOOD BLOOD LEFT HAND  Final   Special Requests   Final    BOTTLES DRAWN AEROBIC AND ANAEROBIC Blood Culture adequate volume   Culture   Final    NO GROWTH 2 DAYS Performed at [facility], [address], [place], [place] [address]    Report Status PENDING  Incomplete  Respiratory (~20 pathogens) panel by PCR     Status: Abnormal   Collection Time: 08/27/23  8:29 AM   Specimen: Nasopharyngeal Swab; Respiratory  Result Value Ref Range Status   Adenovirus NOT DETECTED NOT DETECTED Final   Coronavirus 229E NOT DETECTED NOT DETECTED Final    Comment: (NOTE) The Coronavirus on the Respiratory Panel, DOES NOT test for the novel  Coronavirus (2019 nCoV)    Coronavirus HKU1 NOT DETECTED NOT DETECTED Final   Coronavirus NL63 NOT DETECTED NOT DETECTED Final   Coronavirus OC43 DETECTED (A) NOT DETECTED Final    Metapneumovirus NOT DETECTED NOT DETECTED Final   Rhinovirus / Enterovirus NOT DETECTED NOT DETECTED Final   Influenza A NOT DETECTED NOT DETECTED Final   Influenza B NOT DETECTED NOT DETECTED Final   Parainfluenza Virus 1 NOT DETECTED NOT DETECTED Final   Parainfluenza Virus 2 NOT DETECTED NOT DETECTED Final   Parainfluenza Virus 3 NOT DETECTED NOT DETECTED Final   Parainfluenza Virus 4 NOT DETECTED NOT DETECTED Final   Respiratory Syncytial Virus NOT DETECTED NOT DETECTED Final   Bordetella pertussis NOT DETECTED NOT DETECTED Final   Bordetella Parapertussis NOT DETECTED NOT DETECTED Final   Chlamydophila pneumoniae NOT DETECTED NOT DETECTED Final   Mycoplasma pneumoniae NOT DETECTED NOT DETECTED Final    Comment: Performed at [facility], [address], [place], [place] [address]     Labs: Basic Metabolic Panel: Recent Labs  Lab 08/26/23 2233 08/27/23 0272 08/28/23 0249  08/29/23 0258  NA 136 138 138 139  K 4.4 4.3 3.9 3.9  CL 102 102 102 100  CO2 22 21* 23 28  GLUCOSE 257* 256* 136* 103*  BUN 43* 42* 49* 50*  CREATININE 1.93* 1.81* 2.23* 2.38*  CALCIUM 8.9 8.7* 8.5* 9.0   Liver Function Tests: Recent Labs  Lab 08/26/23 2233 08/28/23 0249  AST 19 12*  ALT 18 14  ALKPHOS 189* 125  BILITOT 1.2 0.8  PROT 8.1 7.3  ALBUMIN 3.4* 2.9*   No results for input(s): "LIPASE", "AMYLASE" in the last 168 hours. No results for input(s): "AMMONIA" in the last 168 hours. CBC: Recent Labs  Lab 08/26/23 2233 08/28/23 0249 08/29/23 0258  WBC 13.0* 11.0* 11.1*  NEUTROABS 11.0*  --   --   HGB 10.9* 9.8* 9.7*  HCT 33.8* 30.7* 30.2*  MCV 87.1 86.5 86.3  PLT 271 223 234   Cardiac Enzymes: Recent Labs  Lab 08/27/23 0338  CKTOTAL 45   BNP: BNP (last 3 results) Recent Labs    12/08/22 0159 07/13/23 1325 08/26/23 2233  BNP 149.2* 212.4* 511.3*    ProBNP (last 3 results) No results for input(s): "PROBNP" in the last 8760 hours.  CBG: Recent Labs  Lab  08/28/23 0610 08/28/23 1148 08/28/23 1626 08/28/23 2053 08/29/23 0600  GLUCAP 98 205* 146* 142* 95       Signed:  Zannie Cove MD.  [facility] 08/29/2023, 9:45 AM

## 2023-08-29 NOTE — Consult Note (Signed)
[facility] Liaison Consult Note   08/29/2023  VERANIA SALBERG 06-15-1961 [id number]  [facility] Patient:  Active with RN CCM  Primary Care Provider:  Blane Ohara, MD this provider is listed to provide the community transition of care follow up and VBCI RN calls  Insurance: [name]  Patient is currently active with [facility] for care coordination services.  Patient has been engaged by a [employer].  The community based plan of care has focused on disease management and community resource support.    Patient will receive a post hospital call and will be evaluated for assessments and disease process education.    Plan: Follow up collaborate with VBCI RN CM of disposition today for home today. She currently has an appointment on 09/04/23 with VBCI RN CC noted.   Of note, [facility] services does not replace or interfere with any services that are needed or arranged by inpatient [facility] care management team.   Charlesetta Shanks, RN, BSN, CCM   [facility], [facility] Liaison Direct Dial: [phone] or secure chat Email: [email or web address]

## 2023-08-30 ENCOUNTER — Telehealth: Payer: Self-pay

## 2023-08-30 NOTE — Transitions of Care (Post Inpatient/ED Visit) (Signed)
   08/30/2023  Name: Dana [name] MRN: [id number] DOB: 13-Feb-1961  Today's TOC FU Call Status: Today's TOC FU Call Status:: Unsuccessful Call (1st Attempt) Unsuccessful Call (1st Attempt) Date: 08/30/23  Attempted to reach the patient regarding the most recent Inpatient/ED visit.  Follow Up Plan: Additional outreach attempts will be made to reach the patient to complete the Transitions of Care (Post Inpatient/ED visit) call.   Signature Creola Corn, LPN  78/29/56 21:30 AM

## 2023-08-30 NOTE — Transitions of Care (Post Inpatient/ED Visit) (Signed)
08/30/2023  Name: Dana [name] MRN: [id number] DOB: 1960/10/10  Today's TOC FU Call Status: Today's TOC FU Call Status:: Successful TOC FU Call Completed TOC FU Call Complete Date: 08/30/23 Patient's Name and Date of Birth confirmed.  Transition Care Management Follow-up Telephone Call Date of Discharge: 08/29/23 Discharge Facility: [facility]) Type of Discharge: Inpatient Admission Primary Inpatient Discharge Diagnosis:: Upper Respiratory Infection How have you been since you were released from the hospital?: Better (Patient notes she is feeling a little better) Any questions or concerns?: No  Items Reviewed: Did you receive and understand the discharge instructions provided?: Yes Medications obtained,verified, and reconciled?: Yes (Medications Reviewed) Any new allergies since your discharge?: No Dietary orders reviewed?: No Do you have support at home?: Yes People in Home: spouse Name of Support/Comfort Primary Source: [name]-husband and patients mother  Medications Reviewed Today: Medications Reviewed Today     Reviewed by Jodelle Gross, RN (Case Manager) on 08/30/23 at 1239  Med List Status: <None>   Medication Order Taking? Sig Documenting Provider Last Dose Status Informant  acetaminophen (TYLENOL) 500 MG tablet [id number] Yes Take 1,000 mg by mouth every 6 (six) hours as needed for mild pain (pain score 1-3) or headache. [provider] Taking Active Self, Pharmacy Records  allopurinol (ZYLOPRIM) 100 MG tablet [id number] Yes Take 0.5 tablets (50 mg total) by mouth daily. Blane Ohara, MD Taking Active Self, Pharmacy Records  aspirin EC 81 MG tablet [id number] Yes Take 1 tablet (81 mg total) by mouth daily. Swallow whole. Cox, Kirsten, MD Taking Active Self, Pharmacy Records  busPIRone (BUSPAR) 10 MG tablet [id number] Yes Take 1 tablet (10 mg total) by mouth 3 (three) times daily. Cox, Kirsten, MD Taking Active Self, Pharmacy Records  carvedilol (COREG) 6.25 MG  tablet [id number] Yes Take 1.5 tablets (9.375 mg total) by mouth 2 (two) times daily. Alen Bleacher, NP Taking Active Self, Pharmacy Records  Continuous Glucose Sensor (FREESTYLE LIBRE 2 SENSOR) [place] [id number] Yes Inject 1 device into skin every 14 (fourteen) days. Blane Ohara, MD Taking Active Self, Pharmacy Records  EPINEPHrine 0.3 mg/0.3 mL IJ SOAJ injection [id number] Yes Use as directed for life-threatening allergic reaction. CoxFritzi Mandes, MD Taking Active Self, Pharmacy Records           Med Note Nedra Hai, NICOLE   Tue Aug 27, 2023  7:35 AM) Pietro Cassis on hand for emergencies  Evolocumab [facility] SURECLICK) 140 MG/ML Ivory Broad [id number] Yes Inject 140 mg into the skin every 14 (fourteen) days. Blane Ohara, MD Taking Active Self, Pharmacy Records           Med Note Nedra Hai, NICOLE   Tue Aug 27, 2023  7:36 AM) Next injection due 08/02/23  ezetimibe (ZETIA) 10 MG tablet [id number] Yes Take 1 tablet (10 mg total) by mouth every evening. Cox, Kirsten, MD Taking Active Self, Pharmacy Records  gabapentin (NEURONTIN) 100 MG capsule [id number] Yes Take 1 capsule (100 mg total) by mouth 2 (two) times daily. Windell Moment, MD Taking Active Self, Pharmacy Records  Glucagon (GVOKE HYPOPEN 2-PACK) 0.5 MG/0.1ML Ivory Broad [id number] Yes Inject 0.5 mg into the skin daily as needed. Cox, Kirsten, MD Taking Active Self, Pharmacy Records           Med Note Nedra Hai, NICOLE   Tue Aug 27, 2023  7:48 AM) Patient keeps on hand  guaiFENesin (MUCINEX) 600 MG 12 hr tablet [id number] Yes Take 1 tablet (600 mg total) by mouth 2 (two) times daily for 5 days. Zannie Cove, MD Taking  Active   HYDROcodone-acetaminophen (NORCO) 7.5-325 MG tablet [id number] Yes Take 1 tablet by mouth 3 (three) times daily as needed.  Patient taking differently: Take 1 tablet by mouth 3 (three) times daily.   Cox, Kirsten, MD Taking Active Self, Pharmacy Records  insulin aspart (NOVOLOG FLEXPEN) 100 UNIT/ML FlexPen [id number] Yes Inject 2 - 10 Units before meals based on  SSI. Cox, Kirsten, MD Taking Active Self, Pharmacy Records  insulin glargine (LANTUS SOLOSTAR) 100 UNIT/ML Solostar Pen [id number] Yes Inject 33-35 Units into the skin daily. Zannie Cove, MD Taking Active   Insulin Pen Needle (BD PEN NEEDLE NANO 2ND GEN) 32G X 4 MM MISC [id number] Yes Inject 1 each into the skin in the morning, at noon, in the evening, and at bedtime. Cox, Kirsten, MD Taking Active Self, Pharmacy Records  isosorbide mononitrate (IMDUR) 30 MG 24 hr tablet [id number] Yes Take 1 tablet (30 mg total) by mouth daily. Alen Bleacher, NP Taking Active Self, Pharmacy Records  latanoprost (XALATAN) 0.005 % ophthalmic solution [id number] Yes Place 1 drop into both eyes at bedtime. CoxFritzi Mandes, MD Taking Active Self, Pharmacy Records  linaclotide [facility]) 145 MCG CAPS capsule [id number] Yes Take 1 capsule (145 mcg total) by mouth daily before breakfast.  Patient taking differently: Take 145 mcg by mouth daily as needed (for constipation).   Cox, Kirsten, MD Taking Active Self, Pharmacy Records  LORazepam (ATIVAN) 0.5 MG tablet [id number] Yes Take 1 tablet (0.5 mg total) by mouth daily as needed for anxiety. Cox, Kirsten, MD Taking Active Self, Pharmacy Records  magnesium oxide (MAG-OX) 400 MG tablet [id number] Yes Take 2 tablets (800 mg total) by mouth daily. Take 2 (400 mg) daily=800 mg Cox, Kirsten, MD Taking Active Self, Pharmacy Records  Menthol-Methyl Salicylate (ICY HOT EXTRA STRENGTH) 10-30 % CREA [id number] Yes Apply 1 Application topically as needed (bilateral hips and knees). [provider] Taking Active Self, Pharmacy Records  nitroGLYCERIN (NITROSTAT) 0.4 MG SL tablet [id number] Yes DISSOLVE ONE TABLET UNDER THE TONGUE EVERY 5 MINUTES AS NEEDED FOR CHEST PAIN.  DO NOT EXCEED A TOTAL OF 3 DOSES IN 15 MINUTES Cox, Kirsten, MD Taking Active Self, Pharmacy Records           Med Note (LEE, NICOLE   Tue Aug 27, 2023  7:41 AM) Pietro Cassis on hand   Omega-3 Fatty Acids (FISH OIL PO) [id number]  Yes Take 1 capsule by mouth daily. [provider] Taking Active Self, Pharmacy Records  OXYGEN [id number] Yes Inhale 2 L/min into the lungs at bedtime. [provider] Taking Active Self, Pharmacy Records  pantoprazole (PROTONIX) 40 MG tablet [id number] Yes Take 1 tablet (40 mg total) by mouth daily.  Patient taking differently: Take 40 mg by mouth daily before breakfast.   Cox, Kirsten, MD Taking Active Self, Pharmacy Records  potassium chloride SA (KLOR-CON M) 20 MEQ tablet [id number] Yes Take 1 tablet (20 mEq total) by mouth daily. Zannie Cove, MD Taking Active            Med Note Electa Sniff, [facility]   Fri Aug 30, 2023 12:39 PM) Patient is taking 2x week and not daily. Needs to discuss with PCP  Probiotic CAPS [id number] Yes Take 1 capsule by mouth daily. [provider] Taking Active Self, Pharmacy Records  promethazine (PHENERGAN) 25 MG tablet [id number] Yes Take 1 tablet (25 mg total) by mouth daily as needed.  Patient taking differently: Take 25 mg by mouth every 8 (eight) hours as needed for nausea or vomiting.  Cox, Kirsten, MD Taking Active Self, Pharmacy Records           Med Note (LEE, NICOLE   Tue Aug 27, 2023  7:43 AM) Pietro Cassis on hand  ticagrelor (BRILINTA) 90 MG TABS tablet [id number] Yes Take 1 tablet (90 mg total) by mouth 2 (two) times daily. Blane Ohara, MD Taking Active Self, Pharmacy Records  torsemide [facility]) 20 MG tablet [id number] Yes 3 tablets (60 mg total) daily. Zannie Cove, MD Taking Active             Home Care and Equipment/Supplies: Were Home Health Services Ordered?: No Any new equipment or medical supplies ordered?: No  Functional Questionnaire: Do you need assistance with bathing/showering or dressing?: Yes Do you need assistance with meal preparation?: No Do you need assistance with eating?: No Do you have difficulty maintaining continence: No Do you need assistance with getting out of bed/getting out of a chair/moving?:  No Do you have difficulty managing or taking your medications?: No  Follow up appointments reviewed: PCP Follow-up appointment confirmed?: Yes Date of PCP follow-up appointment?: 09/06/23 Follow-up Provider: Dr. Sedalia Muta Specialist [facility] Follow-up appointment confirmed?: Yes Date of Specialist follow-up appointment?: 09/12/23 Follow-Up Specialty Provider:: Dr. Glade Stanford Do you need transportation to your follow-up appointment?: No Do you understand care options if your condition(s) worsen?: Yes-patient verbalized understanding  SDOH Interventions Today    Flowsheet Row Most Recent Value  SDOH Interventions   Food Insecurity Interventions Intervention Not Indicated  Housing Interventions Intervention Not Indicated  Transportation Interventions Intervention Not Indicated  Utilities Interventions Intervention Not Indicated      TOC Interventions Today    Flowsheet Row Most Recent Value  TOC Interventions   TOC Interventions Discussed/Reviewed TOC Interventions Discussed, TOC Interventions Reviewed  [Notified CCM of patient hospitalization and denial of 30 day program]       Jodelle Gross RN, BSN, CCM [place]  [facility] Manager Population Health Direct Dial: [phone]  Fax: [phone]

## 2023-09-01 ENCOUNTER — Other Ambulatory Visit: Payer: Self-pay

## 2023-09-01 ENCOUNTER — Other Ambulatory Visit: Payer: Self-pay | Admitting: Family Medicine

## 2023-09-01 DIAGNOSIS — J069 Acute upper respiratory infection, unspecified: Secondary | ICD-10-CM

## 2023-09-01 LAB — CULTURE, BLOOD (ROUTINE X 2)
Culture: NO GROWTH
Culture: NO GROWTH
Special Requests: ADEQUATE
Special Requests: ADEQUATE

## 2023-09-02 ENCOUNTER — Other Ambulatory Visit: Payer: Self-pay

## 2023-09-02 ENCOUNTER — Other Ambulatory Visit (HOSPITAL_COMMUNITY): Payer: Self-pay

## 2023-09-02 MED ORDER — GABAPENTIN 100 MG PO CAPS
100.0000 mg | ORAL_CAPSULE | Freq: Two times a day (BID) | ORAL | 3 refills | Status: DC
  Filled 2023-09-02: qty 180, 90d supply, fill #0

## 2023-09-04 ENCOUNTER — Other Ambulatory Visit: Payer: Self-pay | Admitting: *Deleted

## 2023-09-04 ENCOUNTER — Encounter: Payer: Self-pay | Admitting: *Deleted

## 2023-09-04 NOTE — Patient Outreach (Signed)
Care Management   Visit Note  09/04/2023 Name: Dana [name] MRN: [id number] DOB: [birth year]  Subjective: Dana [name] is a 63 y.o. year old female who is a primary care patient of Cox, Kirsten, MD. The Care Management team was consulted for assistance.      Engaged with patient spoke with patient by telephone.    Goals Addressed             This Visit's Progress    RNCM Care Management Expected Outcome:  Monitor, Self-Manage and Reduce Symptoms of Diabetes       Current Barriers:  Chronic Disease Management support and education needs related to effective management of DM Lab Results  Component Value Date   HGBA1C 9.0 (H) 05/14/2023    Planned Interventions: Provided education to patient about basic DM disease process. Fasting glucose 97 this morning, highest: 196 lowest: 53. Patient knowledgeable about hypoglycemic education. Reports continued use with Josephine Igo Reviewed medications with patient and discussed importance of medication adherence. Patient states compliance with medications. She reports using 39 units of Lantus Reviewed prescribed diet with patient heart healthy/ADA diet. Is compliant of heart healthy/ADA diet.  Counseled on importance of regular laboratory monitoring as prescribed. Has regular lab work done.  Discussed plans with patient for ongoing care management follow up and provided patient with direct contact information for care management team;      Provided patient with written educational materials related to hypo and hyperglycemia and importance of correct treatment.      Reviewed scheduled/upcoming provider appointments including: 09-06-23 with PCP Advised patient, providing education and rationale, to check cbg twice daily and when you have symptoms of low or high blood sugar and record. Has a continuous reader.       call provider for findings outside established parameters;       Referral made to community resources care guide team for assistance with  food insecurities and resources in her area to help with expressed needs. Has talked with care guides and had resources mailed.  ;      Review of patient status, including review of consultants reports, relevant laboratory and other test results, and medications completed;       Advised patient to discuss changes in DM, questions, and concerns with provider;      Screening for signs and symptoms of depression related to chronic disease state;        Assessed social determinant of health barriers;    Eye exam scheduled for August 2025      Symptom Management: Take medications as prescribed   Attend all scheduled provider appointments Call provider office for new concerns or questions  call the Suicide and Crisis Lifeline: 988 call the [place] National Suicide Prevention Lifeline: 475-733-0776 or TTY: 9313226690 TTY 3301759882) to talk to a trained counselor call 1-800-273-TALK (toll free, 24 hour hotline) if experiencing a Mental Health or Behavioral Health Crisis  keep appointment with eye doctor check feet daily for cuts, sores or redness trim toenails straight across manage portion size wash and dry feet carefully every day wear comfortable, cotton socks wear comfortable, well-fitting shoes  Follow Up Plan: Telephone follow up appointment with care management team member scheduled for: 09-11-2023 at 145 pm       RNCM Care Management Expected Outcome:  Monitor, Self-Manage and Reduce Symptoms of: Depression, Anxiety, Stress       Current Barriers:  Knowledge Deficits related to resources available to help with expressed needs at this time due  to her husband losing his job and his unemployment has expired Care Coordination needs related to resources to help meet financial needs at this time in a patient with stress, anxiety, and depression  Chronic Disease Management support and education needs related to effective management of stress, anxiety, and depression Financial Constraints.    Planned Interventions: Evaluation of current treatment plan related to depression, stress, and anxiety and patient's adherence to plan as established by provider. The patient states she is doing well and speaks with her counselor every 2 weeks or more if needed. Reviewed medications with patient and discussed compliance. The patient is compliant with medications.  Provided patient with care guide and SW support as well as support from the interdisciplinary team for management of stress, anxiety, and depression  educational materials related to effective management of stress, anxiety, and depression. Continues to decline LCSW referral at this time. Reviewed scheduled/upcoming provider appointments including  Care Guide referral for community resources for [facility] for food insecurities and assistance with utilities. Has information from the care guides  Social Work referral for education and support and resources for effective management of stress, anxiety, and depression.  Discussed plans with patient for ongoing care management follow up and provided patient with direct contact information for care management team Advised patient to discuss changes in mood, increased anxiety, depression, stress, or other mental health needs with provider Screening for signs and symptoms of depression related to chronic disease state  Assessed social determinant of health barriers  Symptom Management: Take medications as prescribed   Attend all scheduled provider appointments Call provider office for new concerns or questions  call the Suicide and Crisis Lifeline: 988 call the [place] National Suicide Prevention Lifeline: (236) 236-4747 or TTY: (978)526-3719 TTY 873-101-5203) to talk to a trained counselor call 1-800-273-TALK (toll free, 24 hour hotline) if experiencing a Mental Health or Behavioral Health Crisis   Follow Up Plan: Telephone follow up appointment with care management team member scheduled  for: 09-11-2023 at 145 pm       RNCM Care Management Expected Outcome:  Monitor, Self-Manage, and Reduce Symptoms of Hypertension       Current Barriers:  Chronic Disease Management support and education needs related to effective management of HTN BP Readings from Last 3 Encounters:  08/29/23 (!) 144/49  08/22/23 104/64  08/05/23 102/64   Wt Readings from Last 3 Encounters:  08/28/23 218 lb 4.1 oz (99 kg)  08/22/23 223 lb (101.2 kg)  08/05/23 227 lb 12.8 oz (103.3 kg)    Planned Interventions: Evaluation of current treatment plan related to hypertension self management and patient's adherence to plan as established by provider. Regularly checks blood pressure and reports that they are stable. This morning BP 121/62. Denies any chest pain, dizziness. Provided education to patient re: stroke prevention, s/s of heart attack and stroke. Education and support provided Reviewed prescribed diet heart healthy/ADA diet.  Reviewed medications with patient and discussed importance of compliance. Reports compliance with all medications. Discussed plans with patient for ongoing care management follow up and provided patient with direct contact information for care management team; Advised patient, providing education and rationale, to monitor blood pressure daily and record, calling PCP for findings outside established parameters.  Reviewed scheduled/upcoming provider appointments including: 09-06-23 with PCP Advised patient to discuss changes in her blood pressures or heart health  with provider; Provided education on prescribed diet heart healthy/ADA diet.  Discussed complications of poorly controlled blood pressure such as heart disease, stroke, circulatory complications,  vision complications, kidney impairment, sexual dysfunction;  Screening for signs and symptoms of depression related to chronic disease state;  Assessed social determinant of health barriers;   Symptom Management: Take  medications as prescribed   Attend all scheduled provider appointments Call provider office for new concerns or questions  call the Suicide and Crisis Lifeline: 988 call the [place] National Suicide Prevention Lifeline: (360)156-2620 or TTY: 870 085 4446 TTY 229-500-2947) to talk to a trained counselor call 1-800-273-TALK (toll free, 24 hour hotline) if experiencing a Mental Health or Behavioral Health Crisis  check blood pressure 3 times per week write blood pressure results in a log or diary learn about high blood pressure keep a blood pressure log take blood pressure log to all doctor appointments call doctor for signs and symptoms of high blood pressure develop an action plan for high blood pressure keep all doctor appointments take medications for blood pressure exactly as prescribed report new symptoms to your doctor  Follow Up Plan: Telephone follow up appointment with care management team member scheduled for: 09-11-2023 at 145 pm       RNCM Care Management Expected Outcomes: Monitor, Self-Manage and Reduce Symptoms of: CHF       Current Barriers:  Chronic Disease Management support and education needs related to CHF   RNCM Clinical Goal(s):  Patient will verbalize basic understanding of  CHF disease process and self health management plan as evidenced by verbal explanation, recognizing symptoms, lifestyle modifications take all medications exactly as prescribed and will call provider for medication related questions as evidenced by compliance with all medications attend all scheduled medical appointments: with primary care provider and specialist as evidenced by keeping all scheduled appointments demonstrate Improved and Ongoing adherence to prescribed treatment plan for CHF as evidenced by consistent medication compliance, symptom monitoring, lifestyle modifications, daily weights continue to work with RN Care Manager to address care management and care coordination needs related  to  CHF as evidenced by adherence to CM Team Scheduled appointments through collaboration with RN Care manager, provider, and care team.   Interventions: Evaluation of current treatment plan related to  self management and patient's adherence to plan as established by provider   Heart Failure Interventions:  (Status:  Goal on track:  Yes.) Long Term Goal Basic overview and discussion of pathophysiology of Heart Failure reviewed. Patient knowledgeable about disease process and when to seek help. Denies any swelling, shortness of breath at this time. Weighs daily Provided education on low sodium diet. Reports compliance Reviewed Heart Failure Action Plan in depth and provided written copy Assessed need for readable accurate scales in home Provided education about placing scale on hard, flat surface Advised patient to weigh each morning after emptying bladder Discussed importance of daily weight and advised patient to weigh and record daily. Weight 217 lbs  Reviewed role of diuretics in prevention of fluid overload and management of heart failure; Discussed the importance of keeping all appointments with provider Provided patient with education about the role of exercise in the management of heart failure Advised patient to discuss any new changes with provider Screening for signs and symptoms of depression related to chronic disease state  Assessed social determinant of health barriers   Patient Goals/Self-Care Activities: Take all medications as prescribed Attend all scheduled provider appointments Call pharmacy for medication refills 3-7 days in advance of running out of medications Perform all self care activities independently  Call provider office for new concerns or questions  call office if I gain more than  2 pounds in one day or 5 pounds in one week keep legs up while sitting watch for swelling in feet, ankles and legs every day eat more whole grains, fruits and vegetables, lean  meats and healthy fats  Follow Up Plan:  Telephone follow up appointment with care management team member scheduled for:  09-11-2023 at 1:45 pm            Consent to Services:  Patient was given information about care management services, agreed to services, and gave verbal consent to participate.   Plan: Telephone follow up appointment with care management team member scheduled for:09-11-2023 at 1:45 pm Larey Brick, BSN RN [facility], [facility] Health RN Care Manager Direct Dial: [phone]  Fax: [phone]

## 2023-09-04 NOTE — Patient Instructions (Signed)
Visit Information  Thank you for taking time to visit with me today. Please don't hesitate to contact me if I can be of assistance to you before our next scheduled telephone appointment.  Following are the goals we discussed today:   Goals Addressed             This Visit's Progress    RNCM Care Management Expected Outcome:  Monitor, Self-Manage and Reduce Symptoms of Diabetes       Current Barriers:  Chronic Disease Management support and education needs related to effective management of DM Lab Results  Component Value Date   HGBA1C 9.0 (H) 05/14/2023    Planned Interventions: Provided education to patient about basic DM disease process. Fasting glucose 97 this morning, highest: 196 lowest: 53. Patient knowledgeable about hypoglycemic education. Reports continued use with Josephine Igo Reviewed medications with patient and discussed importance of medication adherence. Patient states compliance with medications. She reports using 39 units of Lantus Reviewed prescribed diet with patient heart healthy/ADA diet. Is compliant of heart healthy/ADA diet.  Counseled on importance of regular laboratory monitoring as prescribed. Has regular lab work done.  Discussed plans with patient for ongoing care management follow up and provided patient with direct contact information for care management team;      Provided patient with written educational materials related to hypo and hyperglycemia and importance of correct treatment.      Reviewed scheduled/upcoming provider appointments including: 09-06-23 with PCP Advised patient, providing education and rationale, to check cbg twice daily and when you have symptoms of low or high blood sugar and record. Has a continuous reader.       call provider for findings outside established parameters;       Referral made to community resources care guide team for assistance with food insecurities and resources in her area to help with expressed needs. Has talked with  care guides and had resources mailed.  ;      Review of patient status, including review of consultants reports, relevant laboratory and other test results, and medications completed;       Advised patient to discuss changes in DM, questions, and concerns with provider;      Screening for signs and symptoms of depression related to chronic disease state;        Assessed social determinant of health barriers;    Eye exam scheduled for August 2025      Symptom Management: Take medications as prescribed   Attend all scheduled provider appointments Call provider office for new concerns or questions  call the Suicide and Crisis Lifeline: 988 call the [place] National Suicide Prevention Lifeline: 2485164113 or TTY: 3640908332 TTY (440)258-9141) to talk to a trained counselor call 1-800-273-TALK (toll free, 24 hour hotline) if experiencing a Mental Health or Behavioral Health Crisis  keep appointment with eye doctor check feet daily for cuts, sores or redness trim toenails straight across manage portion size wash and dry feet carefully every day wear comfortable, cotton socks wear comfortable, well-fitting shoes  Follow Up Plan: Telephone follow up appointment with care management team member scheduled for: 09-11-2023 at 145 pm       RNCM Care Management Expected Outcome:  Monitor, Self-Manage and Reduce Symptoms of: Depression, Anxiety, Stress       Current Barriers:  Knowledge Deficits related to resources available to help with expressed needs at this time due to her husband losing his job and his unemployment has expired Care Coordination needs related to resources to help  meet financial needs at this time in a patient with stress, anxiety, and depression  Chronic Disease Management support and education needs related to effective management of stress, anxiety, and depression Financial Constraints.   Planned Interventions: Evaluation of current treatment plan related to depression,  stress, and anxiety and patient's adherence to plan as established by provider. The patient states she is doing well and speaks with her counselor every 2 weeks or more if needed. Reviewed medications with patient and discussed compliance. The patient is compliant with medications.  Provided patient with care guide and SW support as well as support from the interdisciplinary team for management of stress, anxiety, and depression  educational materials related to effective management of stress, anxiety, and depression. Continues to decline LCSW referral at this time. Reviewed scheduled/upcoming provider appointments including  Care Guide referral for community resources for [facility] for food insecurities and assistance with utilities. Has information from the care guides  Social Work referral for education and support and resources for effective management of stress, anxiety, and depression.  Discussed plans with patient for ongoing care management follow up and provided patient with direct contact information for care management team Advised patient to discuss changes in mood, increased anxiety, depression, stress, or other mental health needs with provider Screening for signs and symptoms of depression related to chronic disease state  Assessed social determinant of health barriers  Symptom Management: Take medications as prescribed   Attend all scheduled provider appointments Call provider office for new concerns or questions  call the Suicide and Crisis Lifeline: 988 call the [place] National Suicide Prevention Lifeline: (908) 259-5464 or TTY: 7823241892 TTY (234)292-0464) to talk to a trained counselor call 1-800-273-TALK (toll free, 24 hour hotline) if experiencing a Mental Health or Behavioral Health Crisis   Follow Up Plan: Telephone follow up appointment with care management team member scheduled for: 09-11-2023 at 145 pm       RNCM Care Management Expected Outcome:  Monitor,  Self-Manage, and Reduce Symptoms of Hypertension       Current Barriers:  Chronic Disease Management support and education needs related to effective management of HTN BP Readings from Last 3 Encounters:  08/29/23 (!) 144/49  08/22/23 104/64  08/05/23 102/64   Wt Readings from Last 3 Encounters:  08/28/23 218 lb 4.1 oz (99 kg)  08/22/23 223 lb (101.2 kg)  08/05/23 227 lb 12.8 oz (103.3 kg)    Planned Interventions: Evaluation of current treatment plan related to hypertension self management and patient's adherence to plan as established by provider. Regularly checks blood pressure and reports that they are stable. This morning BP 121/62. Denies any chest pain, dizziness. Provided education to patient re: stroke prevention, s/s of heart attack and stroke. Education and support provided Reviewed prescribed diet heart healthy/ADA diet.  Reviewed medications with patient and discussed importance of compliance. Reports compliance with all medications. Discussed plans with patient for ongoing care management follow up and provided patient with direct contact information for care management team; Advised patient, providing education and rationale, to monitor blood pressure daily and record, calling PCP for findings outside established parameters.  Reviewed scheduled/upcoming provider appointments including: 09-06-23 with PCP Advised patient to discuss changes in her blood pressures or heart health  with provider; Provided education on prescribed diet heart healthy/ADA diet.  Discussed complications of poorly controlled blood pressure such as heart disease, stroke, circulatory complications, vision complications, kidney impairment, sexual dysfunction;  Screening for signs and symptoms of depression related to chronic disease state;  Assessed social determinant of health barriers;   Symptom Management: Take medications as prescribed   Attend all scheduled provider appointments Call provider  office for new concerns or questions  call the Suicide and Crisis Lifeline: 988 call the [place] National Suicide Prevention Lifeline: 706-206-3790 or TTY: 2034640906 TTY 386-581-0559) to talk to a trained counselor call 1-800-273-TALK (toll free, 24 hour hotline) if experiencing a Mental Health or Behavioral Health Crisis  check blood pressure 3 times per week write blood pressure results in a log or diary learn about high blood pressure keep a blood pressure log take blood pressure log to all doctor appointments call doctor for signs and symptoms of high blood pressure develop an action plan for high blood pressure keep all doctor appointments take medications for blood pressure exactly as prescribed report new symptoms to your doctor  Follow Up Plan: Telephone follow up appointment with care management team member scheduled for: 09-11-2023 at 145 pm       RNCM Care Management Expected Outcomes: Monitor, Self-Manage and Reduce Symptoms of: CHF       Current Barriers:  Chronic Disease Management support and education needs related to CHF   RNCM Clinical Goal(s):  Patient will verbalize basic understanding of  CHF disease process and self health management plan as evidenced by verbal explanation, recognizing symptoms, lifestyle modifications take all medications exactly as prescribed and will call provider for medication related questions as evidenced by compliance with all medications attend all scheduled medical appointments: with primary care provider and specialist as evidenced by keeping all scheduled appointments demonstrate Improved and Ongoing adherence to prescribed treatment plan for CHF as evidenced by consistent medication compliance, symptom monitoring, lifestyle modifications, daily weights continue to work with RN Care Manager to address care management and care coordination needs related to  CHF as evidenced by adherence to CM Team Scheduled appointments through  collaboration with RN Care manager, provider, and care team.   Interventions: Evaluation of current treatment plan related to  self management and patient's adherence to plan as established by provider   Heart Failure Interventions:  (Status:  Goal on track:  Yes.) Long Term Goal Basic overview and discussion of pathophysiology of Heart Failure reviewed. Patient knowledgeable about disease process and when to seek help. Denies any swelling, shortness of breath at this time. Weighs daily Provided education on low sodium diet. Reports compliance Reviewed Heart Failure Action Plan in depth and provided written copy Assessed need for readable accurate scales in home Provided education about placing scale on hard, flat surface Advised patient to weigh each morning after emptying bladder Discussed importance of daily weight and advised patient to weigh and record daily. Weight 217 lbs  Reviewed role of diuretics in prevention of fluid overload and management of heart failure; Discussed the importance of keeping all appointments with provider Provided patient with education about the role of exercise in the management of heart failure Advised patient to discuss any new changes with provider Screening for signs and symptoms of depression related to chronic disease state  Assessed social determinant of health barriers   Patient Goals/Self-Care Activities: Take all medications as prescribed Attend all scheduled provider appointments Call pharmacy for medication refills 3-7 days in advance of running out of medications Perform all self care activities independently  Call provider office for new concerns or questions  call office if I gain more than 2 pounds in one day or 5 pounds in one week keep legs up while sitting watch for swelling in  feet, ankles and legs every day eat more whole grains, fruits and vegetables, lean meats and healthy fats  Follow Up Plan:  Telephone follow up appointment  with care management team member scheduled for:  09-11-2023 at 1:45 pm           Our next appointment is by telephone on 09-11-23 at 1:45 pm  Please call the care guide team at [phone] if you need to cancel or reschedule your appointment.   If you are experiencing a Mental Health or Behavioral Health Crisis or need someone to talk to, please call the Suicide and Crisis Lifeline: 988 call the [place] National Suicide Prevention Lifeline: 647-540-3357 or TTY: (239)270-9613 TTY 581-540-9242) to talk to a trained counselor call 1-800-273-TALK (toll free, 24 hour hotline)   Patient verbalizes understanding of instructions and care plan provided today and agrees to view in MyChart. Active MyChart status and patient understanding of how to access instructions and care plan via MyChart confirmed with patient.     Telephone follow up appointment with care management team member scheduled for:09-11-23 at 1:45 pm  Larey Brick, BSN RN [facility], [facility] RN Care Manager Direct Dial: [phone]  Fax: [phone]

## 2023-09-06 ENCOUNTER — Inpatient Hospital Stay: Payer: PPO | Admitting: Family Medicine

## 2023-09-09 DIAGNOSIS — F411 Generalized anxiety disorder: Secondary | ICD-10-CM | POA: Diagnosis not present

## 2023-09-11 ENCOUNTER — Other Ambulatory Visit: Payer: Self-pay | Admitting: *Deleted

## 2023-09-11 NOTE — Patient Instructions (Signed)
 Visit Information  Thank you for taking time to visit with me today. Please don't hesitate to contact me if I can be of assistance to you before our next scheduled telephone appointment.  Following are the goals we discussed today:   Goals Addressed             This Visit's Progress    RNCM Care Management Expected Outcome:  Monitor, Self-Manage and Reduce Symptoms of Diabetes       Current Barriers:  Chronic Disease Management support and education needs related to effective management of DM Lab Results  Component Value Date   HGBA1C 9.0 (H) 05/14/2023    Planned Interventions: Provided education to patient about basic DM disease process. Fasting glucose 84 this morning, highest: 223 lowest: 52. Patient knowledgeable about hypoglycemic education. Reports continued use with Libre. She states there her low blood sugars are typically mid morning. Education provided. Reviewed medications with patient and discussed importance of medication adherence. Patient states compliance with medications. She reports using 39 units of Lantus Reviewed prescribed diet with patient heart healthy/ADA diet. Is compliant of heart healthy/ADA diet.  Counseled on importance of regular laboratory monitoring as prescribed. Has regular lab work done.  Discussed plans with patient for ongoing care management follow up and provided patient with direct contact information for care management team;      Provided patient with written educational materials related to hypo and hyperglycemia and importance of correct treatment.      Reviewed scheduled/upcoming provider appointments including: 09-16-23 with PCP Advised patient, providing education and rationale, to check cbg twice daily and when you have symptoms of low or high blood sugar and record. Has a continuous reader.       call provider for findings outside established parameters;       Referral made to community resources care guide team for assistance with food  insecurities and resources in her area to help with expressed needs. Has talked with care guides and had resources mailed.  ;      Review of patient status, including review of consultants reports, relevant laboratory and other test results, and medications completed;       Advised patient to discuss changes in DM, questions, and concerns with provider;      Screening for signs and symptoms of depression related to chronic disease state;        Assessed social determinant of health barriers;    Eye exam scheduled for August 2025      Symptom Management: Take medications as prescribed   Attend all scheduled provider appointments Call provider office for new concerns or questions  call the Suicide and Crisis Lifeline: 988 call the [place] National Suicide Prevention Lifeline: (647)856-8059 or TTY: 782 664 3671 TTY (609) 064-9239) to talk to a trained counselor call 1-800-273-TALK (toll free, 24 hour hotline) if experiencing a Mental Health or Behavioral Health Crisis  keep appointment with eye doctor check feet daily for cuts, sores or redness trim toenails straight across manage portion size wash and dry feet carefully every day wear comfortable, cotton socks wear comfortable, well-fitting shoes  Follow Up Plan: Telephone follow up appointment with care management team member scheduled for: 10-09-2023 at 145 pm       RNCM Care Management Expected Outcome:  Monitor, Self-Manage and Reduce Symptoms of: Depression, Anxiety, Stress       Current Barriers:  Knowledge Deficits related to resources available to help with expressed needs at this time due to her husband losing his job  and his unemployment has expired Care Coordination needs related to resources to help meet financial needs at this time in a patient with stress, anxiety, and depression  Chronic Disease Management support and education needs related to effective management of stress, anxiety, and depression Financial Constraints.    Planned Interventions: Evaluation of current treatment plan related to depression, stress, and anxiety and patient's adherence to plan as established by provider. The patient states she is doing well and speaks with her counselor every 2 weeks or more if needed. Reviewed medications with patient and discussed compliance. The patient is compliant with medications.  Provided patient with care guide and SW support as well as support from the interdisciplinary team for management of stress, anxiety, and depression  educational materials related to effective management of stress, anxiety, and depression. Continues to decline LCSW referral at this time. Reviewed scheduled/upcoming provider appointments including  Care Guide referral for community resources for [facility] for food insecurities and assistance with utilities. Has information from the care guides  Social Work referral for education and support and resources for effective management of stress, anxiety, and depression.  Discussed plans with patient for ongoing care management follow up and provided patient with direct contact information for care management team Advised patient to discuss changes in mood, increased anxiety, depression, stress, or other mental health needs with provider Screening for signs and symptoms of depression related to chronic disease state  Assessed social determinant of health barriers  Symptom Management: Take medications as prescribed   Attend all scheduled provider appointments Call provider office for new concerns or questions  call the Suicide and Crisis Lifeline: 988 call the [place] National Suicide Prevention Lifeline: (613) 630-7548 or TTY: 947-347-6297 TTY 9474485474) to talk to a trained counselor call 1-800-273-TALK (toll free, 24 hour hotline) if experiencing a Mental Health or Behavioral Health Crisis   Follow Up Plan: Telephone follow up appointment with care management team member scheduled  for: 10-09-2023 at 145 pm       RNCM Care Management Expected Outcome:  Monitor, Self-Manage, and Reduce Symptoms of Hypertension       Current Barriers:  Chronic Disease Management support and education needs related to effective management of HTN BP Readings from Last 3 Encounters:  08/29/23 (!) 144/49  08/22/23 104/64  08/05/23 102/64   Wt Readings from Last 3 Encounters:  08/28/23 218 lb 4.1 oz (99 kg)  08/22/23 223 lb (101.2 kg)  08/05/23 227 lb 12.8 oz (103.3 kg)    Planned Interventions: Evaluation of current treatment plan related to hypertension self management and patient's adherence to plan as established by provider. Regularly checks blood pressure and reports that they are stable. This morning BP 122/60. Denies any chest pain, dizziness or headches. She does endorse some mild swelling that she states is normal. She does wear compression stockings.. Provided education to patient re: stroke prevention, s/s of heart attack and stroke. Education and support provided Reviewed prescribed diet heart healthy/ADA diet.  Reviewed medications with patient and discussed importance of compliance. Reports compliance with all medications. Discussed plans with patient for ongoing care management follow up and provided patient with direct contact information for care management team; Advised patient, providing education and rationale, to monitor blood pressure daily and record, calling PCP for findings outside established parameters.  Reviewed scheduled/upcoming provider appointments including: 09-16-23 with PCP Advised patient to discuss changes in her blood pressures or heart health  with provider; Provided education on prescribed diet heart healthy/ADA diet.  Discussed complications  of poorly controlled blood pressure such as heart disease, stroke, circulatory complications, vision complications, kidney impairment, sexual dysfunction;  Screening for signs and symptoms of depression related to  chronic disease state;  Assessed social determinant of health barriers;   Symptom Management: Take medications as prescribed   Attend all scheduled provider appointments Call provider office for new concerns or questions  call the Suicide and Crisis Lifeline: 988 call the [place] National Suicide Prevention Lifeline: (904)058-5567 or TTY: 207-446-2121 TTY 484-234-9468) to talk to a trained counselor call 1-800-273-TALK (toll free, 24 hour hotline) if experiencing a Mental Health or Behavioral Health Crisis  check blood pressure 3 times per week write blood pressure results in a log or diary learn about high blood pressure keep a blood pressure log take blood pressure log to all doctor appointments call doctor for signs and symptoms of high blood pressure develop an action plan for high blood pressure keep all doctor appointments take medications for blood pressure exactly as prescribed report new symptoms to your doctor  Follow Up Plan: Telephone follow up appointment with care management team member scheduled for: 10-09-2023 at 145 pm       RNCM Care Management Expected Outcomes: Monitor, Self-Manage and Reduce Symptoms of: CHF       Current Barriers:  Chronic Disease Management support and education needs related to CHF   RNCM Clinical Goal(s):  Patient will verbalize basic understanding of  CHF disease process and self health management plan as evidenced by verbal explanation, recognizing symptoms, lifestyle modifications take all medications exactly as prescribed and will call provider for medication related questions as evidenced by compliance with all medications attend all scheduled medical appointments: with primary care provider and specialist as evidenced by keeping all scheduled appointments demonstrate Improved and Ongoing adherence to prescribed treatment plan for CHF as evidenced by consistent medication compliance, symptom monitoring, lifestyle modifications, daily  weights continue to work with RN Care Manager to address care management and care coordination needs related to  CHF as evidenced by adherence to CM Team Scheduled appointments through collaboration with RN Care manager, provider, and care team.   Interventions: Evaluation of current treatment plan related to  self management and patient's adherence to plan as established by provider   Heart Failure Interventions:  (Status:  Goal on track:  Yes.) Long Term Goal Basic overview and discussion of pathophysiology of Heart Failure reviewed. Patient knowledgeable about disease process and when to seek help. Reports mild swelling that she states in normal and she does wear compression stockings, shortness of breath at this time. Weighs daily Provided education on low sodium diet. Reports compliance Reviewed Heart Failure Action Plan in depth and provided written copy Assessed need for readable accurate scales in home Provided education about placing scale on hard, flat surface Advised patient to weigh each morning after emptying bladder Discussed importance of daily weight and advised patient to weigh and record daily. Weight 219 lbs. Average weight 222-223 lbs  Reviewed role of diuretics in prevention of fluid overload and management of heart failure; Discussed the importance of keeping all appointments with provider Provided patient with education about the role of exercise in the management of heart failure Advised patient to discuss any new changes with provider Screening for signs and symptoms of depression related to chronic disease state  Assessed social determinant of health barriers   Patient Goals/Self-Care Activities: Take all medications as prescribed Attend all scheduled provider appointments Call pharmacy for medication refills 3-7 days in advance of  running out of medications Perform all self care activities independently  Call provider office for new concerns or questions  call  office if I gain more than 2 pounds in one day or 5 pounds in one week keep legs up while sitting watch for swelling in feet, ankles and legs every day eat more whole grains, fruits and vegetables, lean meats and healthy fats  Follow Up Plan:  Telephone follow up appointment with care management team member scheduled for:  10-09-2023 at 1:45 pm           Our next appointment is by telephone on 10-09-23 at 1:45 pm  Please call the care guide team at [phone] if you need to cancel or reschedule your appointment.   If you are experiencing a Mental Health or Behavioral Health Crisis or need someone to talk to, please call the Suicide and Crisis Lifeline: 988 call the [place] National Suicide Prevention Lifeline: (878)118-0818 or TTY: 985-734-1305 TTY 706-432-0638) to talk to a trained counselor call 1-800-273-TALK (toll free, 24 hour hotline)   Patient verbalizes understanding of instructions and care plan provided today and agrees to view in MyChart. Active MyChart status and patient understanding of how to access instructions and care plan via MyChart confirmed with patient.     Telephone follow up appointment with care management team member scheduled for:  Larey Brick, BSN RN [facility], [facility] RN Care Manager Direct Dial: [phone]  Fax: [phone]

## 2023-09-11 NOTE — Patient Outreach (Signed)
 Care Management   Visit Note  09/11/2023 Name: Dana [name] MRN: [id number] DOB: 10-Mar-1961  Subjective: Dana [name] is a 63 y.o. year old female who is a primary care patient of Cox, Kirsten, MD. The Care Management team was consulted for assistance.      Engaged with patient spoke with patient by telephone.    Goals Addressed             This Visit's Progress    RNCM Care Management Expected Outcome:  Monitor, Self-Manage and Reduce Symptoms of Diabetes       Current Barriers:  Chronic Disease Management support and education needs related to effective management of DM Lab Results  Component Value Date   HGBA1C 9.0 (H) 05/14/2023    Planned Interventions: Provided education to patient about basic DM disease process. Fasting glucose 84 this morning, highest: 223 lowest: 52. Patient knowledgeable about hypoglycemic education. Reports continued use with Libre. She states there her low blood sugars are typically mid morning. Education provided. Reviewed medications with patient and discussed importance of medication adherence. Patient states compliance with medications. She reports using 39 units of Lantus Reviewed prescribed diet with patient heart healthy/ADA diet. Is compliant of heart healthy/ADA diet.  Counseled on importance of regular laboratory monitoring as prescribed. Has regular lab work done.  Discussed plans with patient for ongoing care management follow up and provided patient with direct contact information for care management team;      Provided patient with written educational materials related to hypo and hyperglycemia and importance of correct treatment.      Reviewed scheduled/upcoming provider appointments including: 09-16-23 with PCP Advised patient, providing education and rationale, to check cbg twice daily and when you have symptoms of low or high blood sugar and record. Has a continuous reader.       call provider for findings outside established  parameters;       Referral made to community resources care guide team for assistance with food insecurities and resources in her area to help with expressed needs. Has talked with care guides and had resources mailed.  ;      Review of patient status, including review of consultants reports, relevant laboratory and other test results, and medications completed;       Advised patient to discuss changes in DM, questions, and concerns with provider;      Screening for signs and symptoms of depression related to chronic disease state;        Assessed social determinant of health barriers;    Eye exam scheduled for August 2025      Symptom Management: Take medications as prescribed   Attend all scheduled provider appointments Call provider office for new concerns or questions  call the Suicide and Crisis Lifeline: 988 call the [place] National Suicide Prevention Lifeline: 667 078 6971 or TTY: 308-446-0793 TTY (641)748-0669) to talk to a trained counselor call 1-800-273-TALK (toll free, 24 hour hotline) if experiencing a Mental Health or Behavioral Health Crisis  keep appointment with eye doctor check feet daily for cuts, sores or redness trim toenails straight across manage portion size wash and dry feet carefully every day wear comfortable, cotton socks wear comfortable, well-fitting shoes  Follow Up Plan: Telephone follow up appointment with care management team member scheduled for: 10-09-2023 at 145 pm       RNCM Care Management Expected Outcome:  Monitor, Self-Manage and Reduce Symptoms of: Depression, Anxiety, Stress       Current Barriers:  Knowledge Deficits  related to resources available to help with expressed needs at this time due to her husband losing his job and his unemployment has expired Care Coordination needs related to resources to help meet financial needs at this time in a patient with stress, anxiety, and depression  Chronic Disease Management support and education needs  related to effective management of stress, anxiety, and depression Financial Constraints.   Planned Interventions: Evaluation of current treatment plan related to depression, stress, and anxiety and patient's adherence to plan as established by provider. The patient states she is doing well and speaks with her counselor every 2 weeks or more if needed. Reviewed medications with patient and discussed compliance. The patient is compliant with medications.  Provided patient with care guide and SW support as well as support from the interdisciplinary team for management of stress, anxiety, and depression  educational materials related to effective management of stress, anxiety, and depression. Continues to decline LCSW referral at this time. Reviewed scheduled/upcoming provider appointments including  Care Guide referral for community resources for [facility] for food insecurities and assistance with utilities. Has information from the care guides  Social Work referral for education and support and resources for effective management of stress, anxiety, and depression.  Discussed plans with patient for ongoing care management follow up and provided patient with direct contact information for care management team Advised patient to discuss changes in mood, increased anxiety, depression, stress, or other mental health needs with provider Screening for signs and symptoms of depression related to chronic disease state  Assessed social determinant of health barriers  Symptom Management: Take medications as prescribed   Attend all scheduled provider appointments Call provider office for new concerns or questions  call the Suicide and Crisis Lifeline: 988 call the [place] National Suicide Prevention Lifeline: 580 335 4440 or TTY: (561) 145-4940 TTY (856)585-4487) to talk to a trained counselor call 1-800-273-TALK (toll free, 24 hour hotline) if experiencing a Mental Health or Behavioral Health Crisis    Follow Up Plan: Telephone follow up appointment with care management team member scheduled for: 10-09-2023 at 145 pm       RNCM Care Management Expected Outcome:  Monitor, Self-Manage, and Reduce Symptoms of Hypertension       Current Barriers:  Chronic Disease Management support and education needs related to effective management of HTN BP Readings from Last 3 Encounters:  08/29/23 (!) 144/49  08/22/23 104/64  08/05/23 102/64   Wt Readings from Last 3 Encounters:  08/28/23 218 lb 4.1 oz (99 kg)  08/22/23 223 lb (101.2 kg)  08/05/23 227 lb 12.8 oz (103.3 kg)    Planned Interventions: Evaluation of current treatment plan related to hypertension self management and patient's adherence to plan as established by provider. Regularly checks blood pressure and reports that they are stable. This morning BP 122/60. Denies any chest pain, dizziness or headches. She does endorse some mild swelling that she states is normal. She does wear compression stockings.. Provided education to patient re: stroke prevention, s/s of heart attack and stroke. Education and support provided Reviewed prescribed diet heart healthy/ADA diet.  Reviewed medications with patient and discussed importance of compliance. Reports compliance with all medications. Discussed plans with patient for ongoing care management follow up and provided patient with direct contact information for care management team; Advised patient, providing education and rationale, to monitor blood pressure daily and record, calling PCP for findings outside established parameters.  Reviewed scheduled/upcoming provider appointments including: 09-16-23 with PCP Advised patient to discuss changes in her  blood pressures or heart health  with provider; Provided education on prescribed diet heart healthy/ADA diet.  Discussed complications of poorly controlled blood pressure such as heart disease, stroke, circulatory complications, vision complications,  kidney impairment, sexual dysfunction;  Screening for signs and symptoms of depression related to chronic disease state;  Assessed social determinant of health barriers;   Symptom Management: Take medications as prescribed   Attend all scheduled provider appointments Call provider office for new concerns or questions  call the Suicide and Crisis Lifeline: 988 call the [place] National Suicide Prevention Lifeline: 828 517 5903 or TTY: (419)721-6827 TTY 573 519 7809) to talk to a trained counselor call 1-800-273-TALK (toll free, 24 hour hotline) if experiencing a Mental Health or Behavioral Health Crisis  check blood pressure 3 times per week write blood pressure results in a log or diary learn about high blood pressure keep a blood pressure log take blood pressure log to all doctor appointments call doctor for signs and symptoms of high blood pressure develop an action plan for high blood pressure keep all doctor appointments take medications for blood pressure exactly as prescribed report new symptoms to your doctor  Follow Up Plan: Telephone follow up appointment with care management team member scheduled for: 10-09-2023 at 145 pm       RNCM Care Management Expected Outcomes: Monitor, Self-Manage and Reduce Symptoms of: CHF       Current Barriers:  Chronic Disease Management support and education needs related to CHF   RNCM Clinical Goal(s):  Patient will verbalize basic understanding of  CHF disease process and self health management plan as evidenced by verbal explanation, recognizing symptoms, lifestyle modifications take all medications exactly as prescribed and will call provider for medication related questions as evidenced by compliance with all medications attend all scheduled medical appointments: with primary care provider and specialist as evidenced by keeping all scheduled appointments demonstrate Improved and Ongoing adherence to prescribed treatment plan for CHF as  evidenced by consistent medication compliance, symptom monitoring, lifestyle modifications, daily weights continue to work with RN Care Manager to address care management and care coordination needs related to  CHF as evidenced by adherence to CM Team Scheduled appointments through collaboration with RN Care manager, provider, and care team.   Interventions: Evaluation of current treatment plan related to  self management and patient's adherence to plan as established by provider   Heart Failure Interventions:  (Status:  Goal on track:  Yes.) Long Term Goal Basic overview and discussion of pathophysiology of Heart Failure reviewed. Patient knowledgeable about disease process and when to seek help. Reports mild swelling that she states in normal and she does wear compression stockings, shortness of breath at this time. Weighs daily Provided education on low sodium diet. Reports compliance Reviewed Heart Failure Action Plan in depth and provided written copy Assessed need for readable accurate scales in home Provided education about placing scale on hard, flat surface Advised patient to weigh each morning after emptying bladder Discussed importance of daily weight and advised patient to weigh and record daily. Weight 219 lbs. Average weight 222-223 lbs  Reviewed role of diuretics in prevention of fluid overload and management of heart failure; Discussed the importance of keeping all appointments with provider Provided patient with education about the role of exercise in the management of heart failure Advised patient to discuss any new changes with provider Screening for signs and symptoms of depression related to chronic disease state  Assessed social determinant of health barriers   Patient Goals/Self-Care Activities: Take  all medications as prescribed Attend all scheduled provider appointments Call pharmacy for medication refills 3-7 days in advance of running out of medications Perform  all self care activities independently  Call provider office for new concerns or questions  call office if I gain more than 2 pounds in one day or 5 pounds in one week keep legs up while sitting watch for swelling in feet, ankles and legs every day eat more whole grains, fruits and vegetables, lean meats and healthy fats  Follow Up Plan:  Telephone follow up appointment with care management team member scheduled for:  10-09-2023 at 1:45 pm              Consent to Services:  Patient was given information about care management services, agreed to services, and gave verbal consent to participate.   Plan: Telephone follow up appointment with care management team member scheduled for:10-09-23 at 1:45 pm  Larey Brick, BSN RN [facility], [facility] RN Care Manager Direct Dial: [phone]  Fax: [phone]

## 2023-09-12 ENCOUNTER — Ambulatory Visit: Payer: PPO | Admitting: Podiatry

## 2023-09-15 NOTE — Progress Notes (Unsigned)
 Subjective:  Patient ID: Dana [name], female    DOB: 25-Oct-1960  Age: 63 y.o. MRN: [id number]  No chief complaint on file.   HPI   Admit date: 08/26/2023 Discharge date: 08/29/2023   Discharge Diagnoses:  URI, bronchitis Acute on chronic systolic and diastolic CHF CKD 4   Elevated troponin   Abnormal echocardiogram   Coronary artery disease involving native coronary artery of native heart without angina pectoris     Discharge Condition: Improved   Diet recommendation: Low-sodium, diabetic  She presented to the ED with nasal congestion, cough, fevers, headache along with shortness of breath In the ED creatinine was 1.9, BNP 511, WBC 13, UA abnormal, noncontrast CT chest with cardiomegaly and interstitial edema, nuclear medicine scan low probability, RVP positive for coronavirus OC43    Acute on chronic systolic and diastolic CHF -Echo with EF down to 40-45% with wall motion abnormality, EF down from ECHO 07/15/23 -Cardiac catheterization/PCI 07/18/2023: Mid LAD to Dist LAD lesion is 60% stenosed., Mid LAD lesion is 90% stenosed, Mid Cx to Dist Cx lesion is 100% stenosed,  1st Mrg-2 lesion is 90% stenosed, Non-stenotic Mid Cx lesion was previously treated, balloon angioplasty of prox LAD stented segment      07/29/2023   10:57 AM 07/12/2023   10:39 AM 05/13/2023    3:54 PM 04/04/2023   10:52 AM 03/26/2023    3:03 PM  Depression screen PHQ 2/9  Decreased Interest 0 3 1 0 1  Down, Depressed, Hopeless 0 2 0 2 1  PHQ - 2 Score 0 5 1 2 2   Altered sleeping 3 3  3 1   Tired, decreased energy 1 3  3 1   Change in appetite 0 3  3 1   Feeling bad or failure about yourself  2 0  2 0  Trouble concentrating 1 0  2 0  Moving slowly or fidgety/restless 0 0  1 1  Suicidal thoughts 0 0  0 0  PHQ-9 Score 7 14  16 6   Difficult doing work/chores Somewhat difficult Somewhat difficult  Somewhat difficult Somewhat difficult        08/01/2023    3:59 PM  Fall Risk   Falls in the past year? 1   Number falls in past yr: 0  Injury with Fall? 0  Risk for fall due to : No Fall Risks  Follow up Falls evaluation completed    Patient Care Team: Blane Ohara, MD as PCP - General (Family Medicine) Bensimhon, Bevelyn Buckles, MD as PCP - Cardiology (Cardiology) Marcelyn Bruins, MD as Consulting Physician (Allergy) Bensimhon, Bevelyn Buckles, MD as Consulting Physician (Cardiology) Beverly Gust, MD as Referring Physician (Gastroenterology) Doristine Bosworth., MD as Consulting Physician (Endocrinology) Margarette Canada., DPM as Consulting Physician (Podiatry) Donnetta Hail, MD as Consulting Physician (Rheumatology) Barnabas Lister, MD as Referring Physician (Nephrology) Orbie Pyo, MD as Consulting Physician (Cardiology) Dellia Beckwith, MD as Consulting Physician (Oncology) Marisue Brooklyn, MD as Referring Physician Doristine Bosworth., MD (Endocrinology) Randa Spike Kelton Pillar, LCSW as [facility] Care Management (Licensed Clinical Social Worker) Ricky Stabs, RN as [facility] Care Management (General Practice) Chrys Racer (Ophthalmology)   Review of Systems  Current Outpatient Medications on File Prior to Visit  Medication Sig Dispense Refill   acetaminophen (TYLENOL) 500 MG tablet Take 1,000 mg by mouth every 6 (six) hours as needed for mild pain (pain score 1-3) or headache.     allopurinol (ZYLOPRIM) 100  MG tablet Take 0.5 tablets (50 mg total) by mouth daily. 90 tablet 1   aspirin EC 81 MG tablet Take 1 tablet (81 mg total) by mouth daily. Swallow whole. 30 tablet 12   busPIRone (BUSPAR) 10 MG tablet Take 1 tablet (10 mg total) by mouth 3 (three) times daily. 90 tablet 2   carvedilol (COREG) 6.25 MG tablet Take 1.5 tablets (9.375 mg total) by mouth 2 (two) times daily. 90 tablet 3   Continuous Glucose Sensor (FREESTYLE LIBRE 2 SENSOR) MISC Inject 1 device into skin every 14 (fourteen) days. 2 each 2   EPINEPHrine 0.3 mg/0.3 mL IJ SOAJ  injection Use as directed for life-threatening allergic reaction. 4 each 3   Evolocumab (REPATHA SURECLICK) 140 MG/ML SOAJ Inject 140 mg into the skin every 14 (fourteen) days. 6 mL 1   ezetimibe (ZETIA) 10 MG tablet Take 1 tablet (10 mg total) by mouth every evening. 90 tablet 1   gabapentin (NEURONTIN) 100 MG capsule Take 1 capsule (100 mg total) by mouth 2 (two) times daily. 180 capsule 3   Glucagon (GVOKE HYPOPEN 2-PACK) 0.5 MG/0.1ML SOAJ Inject 0.5 mg into the skin daily as needed. 0.2 mL 5   HYDROcodone-acetaminophen (NORCO) 7.5-325 MG tablet Take 1 tablet by mouth 3 (three) times daily as needed. (Patient taking differently: Take 1 tablet by mouth 3 (three) times daily.) 90 tablet 0   insulin aspart (NOVOLOG FLEXPEN) 100 UNIT/ML FlexPen Inject 2 - 10 Units before meals based on SSI. 15 mL 3   insulin glargine (LANTUS SOLOSTAR) 100 UNIT/ML Solostar Pen Inject 33-35 Units into the skin daily.     Insulin Pen Needle (BD PEN NEEDLE NANO 2ND GEN) 32G X 4 MM MISC Inject 1 each into the skin in the morning, at noon, in the evening, and at bedtime. 600 each 3   isosorbide mononitrate (IMDUR) 30 MG 24 hr tablet Take 1 tablet (30 mg total) by mouth daily. 30 tablet 3   latanoprost (XALATAN) 0.005 % ophthalmic solution Place 1 drop into both eyes at bedtime. 2.5 mL 0   linaclotide (LINZESS) 145 MCG CAPS capsule Take 1 capsule (145 mcg total) by mouth daily before breakfast. (Patient taking differently: Take 145 mcg by mouth daily as needed (for constipation).) 90 capsule 1   LORazepam (ATIVAN) 0.5 MG tablet Take 1 tablet (0.5 mg total) by mouth daily as needed for anxiety. 90 tablet 1   magnesium oxide (MAG-OX) 400 MG tablet Take 2 tablets (800 mg total) by mouth daily. Take 2 (400 mg) daily=800 mg 180 tablet 1   Menthol-Methyl Salicylate (ICY HOT EXTRA STRENGTH) 10-30 % CREA Apply 1 Application topically as needed (bilateral hips and knees).     nitroGLYCERIN (NITROSTAT) 0.4 MG SL tablet DISSOLVE ONE  TABLET UNDER THE TONGUE EVERY 5 MINUTES AS NEEDED FOR CHEST PAIN.  DO NOT EXCEED A TOTAL OF 3 DOSES IN 15 MINUTES 50 tablet 3   Omega-3 Fatty Acids (FISH OIL PO) Take 1 capsule by mouth daily.     OXYGEN Inhale 2 L/min into the lungs at bedtime.     pantoprazole (PROTONIX) 40 MG tablet Take 1 tablet (40 mg total) by mouth daily. (Patient taking differently: Take 40 mg by mouth daily before breakfast.) 90 tablet 1   potassium chloride SA (KLOR-CON M) 20 MEQ tablet Take 1 tablet (20 mEq total) by mouth daily.     Probiotic CAPS Take 1 capsule by mouth daily.     promethazine (PHENERGAN) 25 MG tablet  Take 1 tablet (25 mg total) by mouth daily as needed. (Patient taking differently: Take 25 mg by mouth every 8 (eight) hours as needed for nausea or vomiting.) 90 tablet 1   ticagrelor (BRILINTA) 90 MG TABS tablet Take 1 tablet (90 mg total) by mouth 2 (two) times daily. 180 tablet 1   torsemide (DEMADEX) 20 MG tablet 3 tablets (60 mg total) daily.     No current facility-administered medications on file prior to visit.   Past Medical History:  Diagnosis Date   Acquired absence of left great toe (HCC)    Acquired absence of other left toe(s) (HCC)    ACS (acute coronary syndrome) (HCC)    Anemia    Anoxic brain injury (HCC)    Anxiety    CAD (coronary artery disease)    DES mLAD 04/07/15; DES dRCA & DES pPDA 08/30/16; DES mCX & PTCA OM1 09/29/20   Chronic diastolic (congestive) heart failure (HCC)    Chronic pain    Chronic systolic (congestive) heart failure (HCC)    CKD (chronic kidney disease)    Contrast dye induced nephropathy, possible 09/03/2020   COPD (chronic obstructive pulmonary disease) (HCC)    COVID-19 virus infection 03/03/2023   Diabetes (HCC)type 1    Diastolic CHF (HCC)    Dyspnea    Family history of breast cancer 10/23/2021   Gastro-esophageal reflux disease without esophagitis    Hyperlipidemia    Hypertension    Hypertensive heart disease with heart failure (HCC)     Hypoxemia    Idiopathic gout, multiple sites    Leukocytosis 03/29/2022   Localized edema    Low back pain    Mitral regurgitation    Mitral regurgitation    Mixed hyperlipidemia    Mixed incontinence    Morbid (severe) obesity due to excess calories (HCC)    Neuromuscular disorder (HCC)    Neuropathy    Non-ST elevation (NSTEMI) myocardial infarction (HCC)    08/29/20, 09/29/20   OSA (obstructive sleep apnea) 09/03/2020   Other specified hypothyroidism    PAF (paroxysmal atrial fibrillation) (HCC)    s/p pulmonary vein isolation by cryoablation 10/04/15 Dr. Rudolpho Sevin in [facility]   PEA (Pulseless electrical activity) [facility])    ~ 01/13/21 at [facility] during admission DKA, volume overload, possible CAP, hypoxia s/p ACLS with ROSC ~ 10-15   Pneumonia    Primary insomnia    Rheumatoid arthritis (HCC)    Stroke (HCC)    Thyroid disease    Type 1 diabetes mellitus (HCC)    Vitamin D deficiency    Past Surgical History:  Procedure Laterality Date   ABDOMINAL HYSTERECTOMY     Partial- Still has both ovaries   CARDIOVASCULAR STRESS TEST  08/13/2014   Nuclear; Normal   CARPAL TUNNEL RELEASE     CATARACT EXTRACTION, BILATERAL     CESAREAN SECTION     CHOLECYSTECTOMY     CORONARY ANGIOPLASTY WITH STENT PLACEMENT     3 blockages/ 1 stent   CORONARY BALLOON ANGIOPLASTY N/A 07/18/2023   Procedure: CORONARY BALLOON ANGIOPLASTY;  Surgeon: Kathleene Hazel, MD;  [facility]: [facility] INVASIVE CV LAB;  Service: Cardiovascular;  Laterality: N/A;   CORONARY PRESSURE/FFR STUDY N/A 09/07/2022   Procedure: INTRAVASCULAR PRESSURE WIRE/FFR STUDY;  Surgeon: Swaziland, Peter M, MD;  Location: [facility] INVASIVE CV LAB;  Service: Cardiovascular;  Laterality: N/A;   CORONARY STENT INTERVENTION N/A 09/29/2020   Procedure: CORONARY STENT INTERVENTION;  Surgeon: Tonny Bollman, MD;  Location: [facility]  INVASIVE CV LAB;  Service: Cardiovascular;  Laterality: N/A;  CFX   CORONARY STENT INTERVENTION N/A 09/07/2022   Procedure:  CORONARY STENT INTERVENTION;  Surgeon: Swaziland, Peter M, MD;  Location: [facility] INVASIVE CV LAB;  Service: Cardiovascular;  Laterality: N/A;   CORONARY STENT INTERVENTION N/A 02/13/2023   Procedure: CORONARY STENT INTERVENTION;  Surgeon: Kathleene Hazel, MD;  Location: [facility] INVASIVE CV LAB;  Service: Cardiovascular;  Laterality: N/A;  LAD   CORONARY ULTRASOUND/IVUS N/A 02/13/2023   Procedure: Coronary Ultrasound/IVUS;  Surgeon: Kathleene Hazel, MD;  Location: [facility] INVASIVE CV LAB;  Service: Cardiovascular;  Laterality: N/A;   CORONARY/GRAFT ACUTE MI REVASCULARIZATION N/A 12/08/2022   Procedure: Coronary/Graft Acute MI Revascularization;  Surgeon: Marykay Lex, MD;  Location: [facility] INVASIVE CV LAB;  Service: Cardiovascular;  Laterality: N/A;   EYE SURGERY     LEFT HEART CATH AND CORONARY ANGIOGRAPHY N/A 09/29/2020   Procedure: LEFT HEART CATH AND CORONARY ANGIOGRAPHY;  Surgeon: Tonny Bollman, MD;  Location: [facility] INVASIVE CV LAB;  Service: Cardiovascular;  Laterality: N/A;   LEFT HEART CATH AND CORONARY ANGIOGRAPHY N/A 09/07/2022   Procedure: LEFT HEART CATH AND CORONARY ANGIOGRAPHY;  Surgeon: Swaziland, Peter M, MD;  Location: [facility] INVASIVE CV LAB;  Service: Cardiovascular;  Laterality: N/A;   LEFT HEART CATH AND CORONARY ANGIOGRAPHY N/A 12/08/2022   Procedure: LEFT HEART CATH AND CORONARY ANGIOGRAPHY;  Surgeon: Marykay Lex, MD;  Location: [facility] INVASIVE CV LAB;  Service: Cardiovascular;  Laterality: N/A;   LEFT HEART CATH AND CORONARY ANGIOGRAPHY N/A 02/13/2023   Procedure: LEFT HEART CATH AND CORONARY ANGIOGRAPHY;  Surgeon: Kathleene Hazel, MD;  Location: [facility] INVASIVE CV LAB;  Service: Cardiovascular;  Laterality: N/A;   LEFT HEART CATH AND CORONARY ANGIOGRAPHY N/A 07/16/2023   Procedure: LEFT HEART CATH AND CORONARY ANGIOGRAPHY;  Surgeon: Kathleene Hazel, MD;  Location: [facility] INVASIVE CV LAB;  Service: Cardiovascular;  Laterality: N/A;   RIGHT HEART CATH N/A 04/27/2021   Procedure:  RIGHT HEART CATH;  Surgeon: Dolores Patty, MD;  Location: [facility] INVASIVE CV LAB;  Service: Cardiovascular;  Laterality: N/A;   RIGHT/LEFT HEART CATH AND CORONARY ANGIOGRAPHY N/A 01/07/2017   Procedure: Right/Left Heart Cath and Coronary Angiography;  Surgeon: Laurey Morale, MD;  Location: [facility] INVASIVE CV LAB;  Service: Cardiovascular;  Laterality: N/A;   ROTATOR CUFF REPAIR     TEE WITHOUT CARDIOVERSION N/A 04/28/2021   Procedure: TRANSESOPHAGEAL ECHOCARDIOGRAM (TEE);  Surgeon: Dolores Patty, MD;  Location: [facility] ENDOSCOPY;  Service: Cardiovascular;  Laterality: N/A;   TEE WITHOUT CARDIOVERSION N/A 05/18/2021   Procedure: TRANSESOPHAGEAL ECHOCARDIOGRAM (TEE);  Surgeon: Orbie Pyo, MD;  Location: [facility] INVASIVE CV LAB;  Service: Cardiovascular;  Laterality: N/A;   TOE AMPUTATION Left    TRANSCATHETER MITRAL EDGE TO EDGE REPAIR N/A 05/18/2021   Procedure: MITRAL VALVE REPAIR;  Surgeon: Orbie Pyo, MD;  Location: [facility] INVASIVE CV LAB;  Service: Cardiovascular;  Laterality: N/A;   US ECHOCARDIOGRAPHY  05/2017   Normal    Family History  Problem Relation Age of Onset   Breast cancer Mother 18       contralateral breast ca dx 37   Coronary artery disease Mother    Hypertension Mother    Hyperlipidemia Mother    Diabetes type II Mother    Lung cancer Mother 33   Diabetes Father    Hypertension Father    Mental illness Sister    Bipolar disorder Other    Depression Other  Schizophrenia Other    Cancer Other        MGF's sisters x2; unknown type   Social History   Socioeconomic History   Marital status: Married    Spouse name: [name]   Number of children: 2   Years of education: 16   Highest education level: Master's degree (e.g., MA, MS, MEng, MEd, MSW, MBA)  Occupational History   Occupation: on disability/retired early former Engineer, site  Tobacco Use   Smoking status: Former    Current packs/day: 0.00    Types: Cigarettes    Start date: 41    Quit date: 1984     Years since quitting: 41.1    Passive exposure: Past   Smokeless tobacco: Never  Vaping Use   Vaping status: Never Used  Substance and Sexual Activity   Alcohol use: Never   Drug use: Never   Sexual activity: Not on file  Other Topics Concern   Not on file  Social History Narrative   Not on file   Social Drivers of Health   Financial Resource Strain: Low Risk  (07/01/2023)   Overall Financial Resource Strain (CARDIA)    Difficulty of Paying Living Expenses: Not hard at all  Food Insecurity: No Food Insecurity (08/30/2023)   Hunger Vital Sign    Worried About Running Out of Food in the Last Year: Never true    Ran Out of Food in the Last Year: Never true  Transportation Needs: No Transportation Needs (08/30/2023)   PRAPARE - Administrator, Civil Service (Medical): No    Lack of Transportation (Non-Medical): No  Physical Activity: Insufficiently Active (07/01/2023)   Exercise Vital Sign    Days of Exercise per Week: 5 days    Minutes of Exercise per Session: 20 min  Stress: Stress Concern Present (07/01/2023)   [employer] of Occupational Health - Occupational Stress Questionnaire    Feeling of Stress : To some extent  Social Connections: Moderately Integrated (07/25/2022)   Social Connection and Isolation Panel [NHANES]    Frequency of Communication with Friends and Family: More than three times a week    Frequency of Social Gatherings with Friends and Family: More than three times a week    Attends Religious Services: More than 4 times per year    Active Member of [employer] or Organizations: No    Attends Banker Meetings: Never    Marital Status: Married    Objective:  There were no vitals taken for this visit.     08/29/2023    6:02 AM 08/29/2023   12:08 AM 08/28/2023    8:19 PM  BP/Weight  Systolic BP 144 126 133  Diastolic BP 49 48 61    Physical Exam  Diabetic Foot Exam - Simple   No data filed      Lab Results  Component  Value Date   WBC 11.1 (H) 08/29/2023   HGB 9.7 (L) 08/29/2023   HCT 30.2 (L) 08/29/2023   PLT 234 08/29/2023   GLUCOSE 103 (H) 08/29/2023   CHOL 92 (L) 07/12/2023   TRIG 81 07/12/2023   HDL 47 07/12/2023   LDLCALC 28 07/12/2023   ALT 14 08/28/2023   AST 12 (L) 08/28/2023   NA 139 08/29/2023   K 3.9 08/29/2023   CL 100 08/29/2023   CREATININE 2.38 (H) 08/29/2023   BUN 50 (H) 08/29/2023   CO2 28 08/29/2023   TSH 2.900 01/19/2022   INR 1.1 05/16/2021  HGBA1C 9.0 (H) 05/14/2023      Assessment & Plan:    There are no diagnoses linked to this encounter.   No orders of the defined types were placed in this encounter.   No orders of the defined types were placed in this encounter.    Follow-up: No follow-ups on file.   I,Marla I Leal-Borjas,acting as a scribe for Blane Ohara, MD.,have documented all relevant documentation on the behalf of Blane Ohara, MD,as directed by  Blane Ohara, MD while in the presence of Blane Ohara, MD.   An After Visit Summary was printed and given to the patient.  Blane Ohara, MD [facility] [phone]

## 2023-09-16 ENCOUNTER — Ambulatory Visit (INDEPENDENT_AMBULATORY_CARE_PROVIDER_SITE_OTHER): Payer: PPO | Admitting: Family Medicine

## 2023-09-16 ENCOUNTER — Encounter: Payer: Self-pay | Admitting: Family Medicine

## 2023-09-16 ENCOUNTER — Other Ambulatory Visit: Payer: Self-pay

## 2023-09-16 ENCOUNTER — Other Ambulatory Visit (HOSPITAL_COMMUNITY): Payer: Self-pay

## 2023-09-16 VITALS — BP 126/82 | HR 84 | Temp 97.8°F | Ht 64.0 in | Wt 225.0 lb

## 2023-09-16 DIAGNOSIS — J208 Acute bronchitis due to other specified organisms: Secondary | ICD-10-CM | POA: Diagnosis not present

## 2023-09-16 DIAGNOSIS — U071 COVID-19: Secondary | ICD-10-CM

## 2023-09-16 DIAGNOSIS — Z1231 Encounter for screening mammogram for malignant neoplasm of breast: Secondary | ICD-10-CM | POA: Diagnosis not present

## 2023-09-16 DIAGNOSIS — J069 Acute upper respiratory infection, unspecified: Secondary | ICD-10-CM

## 2023-09-16 DIAGNOSIS — I5043 Acute on chronic combined systolic (congestive) and diastolic (congestive) heart failure: Secondary | ICD-10-CM | POA: Insufficient documentation

## 2023-09-16 HISTORY — DX: Acute on chronic combined systolic (congestive) and diastolic (congestive) heart failure: I50.43

## 2023-09-16 HISTORY — DX: Encounter for screening mammogram for malignant neoplasm of breast: Z12.31

## 2023-09-16 NOTE — Assessment & Plan Note (Signed)
 Order mammogram

## 2023-09-16 NOTE — Assessment & Plan Note (Addendum)
 Resolved

## 2023-09-16 NOTE — Assessment & Plan Note (Signed)
 Resolved.  Management per specialist.

## 2023-09-25 ENCOUNTER — Ambulatory Visit: Payer: PPO | Admitting: Podiatry

## 2023-09-27 ENCOUNTER — Encounter: Payer: Self-pay | Admitting: Family Medicine

## 2023-09-30 ENCOUNTER — Other Ambulatory Visit (HOSPITAL_COMMUNITY): Payer: Self-pay

## 2023-10-01 DIAGNOSIS — F411 Generalized anxiety disorder: Secondary | ICD-10-CM | POA: Diagnosis not present

## 2023-10-03 DIAGNOSIS — D631 Anemia in chronic kidney disease: Secondary | ICD-10-CM | POA: Diagnosis not present

## 2023-10-03 DIAGNOSIS — N1832 Chronic kidney disease, stage 3b: Secondary | ICD-10-CM | POA: Diagnosis not present

## 2023-10-03 DIAGNOSIS — I2510 Atherosclerotic heart disease of native coronary artery without angina pectoris: Secondary | ICD-10-CM | POA: Diagnosis not present

## 2023-10-03 DIAGNOSIS — I129 Hypertensive chronic kidney disease with stage 1 through stage 4 chronic kidney disease, or unspecified chronic kidney disease: Secondary | ICD-10-CM | POA: Diagnosis not present

## 2023-10-04 ENCOUNTER — Ambulatory Visit (INDEPENDENT_AMBULATORY_CARE_PROVIDER_SITE_OTHER): Admitting: Podiatry

## 2023-10-04 ENCOUNTER — Ambulatory Visit: Payer: Self-pay

## 2023-10-04 ENCOUNTER — Encounter: Payer: Self-pay | Admitting: Family Medicine

## 2023-10-04 ENCOUNTER — Ambulatory Visit: Admitting: Family Medicine

## 2023-10-04 ENCOUNTER — Ambulatory Visit: Payer: Self-pay | Admitting: *Deleted

## 2023-10-04 VITALS — BP 120/62 | HR 78 | Temp 98.0°F | Resp 16 | Ht 64.0 in | Wt 221.0 lb

## 2023-10-04 DIAGNOSIS — M79675 Pain in left toe(s): Secondary | ICD-10-CM | POA: Diagnosis not present

## 2023-10-04 DIAGNOSIS — F322 Major depressive disorder, single episode, severe without psychotic features: Secondary | ICD-10-CM | POA: Insufficient documentation

## 2023-10-04 DIAGNOSIS — F411 Generalized anxiety disorder: Secondary | ICD-10-CM | POA: Diagnosis not present

## 2023-10-04 DIAGNOSIS — E1142 Type 2 diabetes mellitus with diabetic polyneuropathy: Secondary | ICD-10-CM

## 2023-10-04 DIAGNOSIS — B351 Tinea unguium: Secondary | ICD-10-CM | POA: Diagnosis not present

## 2023-10-04 DIAGNOSIS — M79674 Pain in right toe(s): Secondary | ICD-10-CM | POA: Diagnosis not present

## 2023-10-04 DIAGNOSIS — F5109 Other insomnia not due to a substance or known physiological condition: Secondary | ICD-10-CM

## 2023-10-04 DIAGNOSIS — Z89419 Acquired absence of unspecified great toe: Secondary | ICD-10-CM

## 2023-10-04 LAB — LAB REPORT - SCANNED
Calcium: 9.3
EGFR: 25
PTH: 124

## 2023-10-04 MED ORDER — DOXEPIN HCL 150 MG PO CAPS: 150.0000 mg | ORAL_CAPSULE | Freq: Every day | ORAL | 0 refills | Status: DC

## 2023-10-04 MED ORDER — BUSPIRONE HCL 15 MG PO TABS: 15.0000 mg | ORAL_TABLET | Freq: Three times a day (TID) | ORAL | 0 refills | Status: AC

## 2023-10-04 NOTE — Progress Notes (Signed)
 Subjective:  Patient ID: Dana [name], female    DOB: [birth year],  MRN: [id number]  Dana [name] presents to clinic today for:  Chief Complaint  Patient presents with   [facility]    [facility] no callous Last A1c was in Feb it was 9 takes ASA 81 and Fish oil.   Patient notes nails are thick and elongated, causing pain in shoe gear when ambulating.  She has had previous amputation of left 1st and 4th toes.  Notes she was hospitalized for 4 days in February with coronavirus 43.    PCP is Cox, Fritzi Mandes, MD.  Last seen 09/16/23.  Past Medical History:  Diagnosis Date   Acquired absence of left great toe (HCC)    Acquired absence of other left toe(s) (HCC)    ACS (acute coronary syndrome) (HCC)    Anemia    Anoxic brain injury (HCC)    Anxiety    CAD (coronary artery disease)    DES mLAD 04/07/15; DES dRCA & DES pPDA 08/30/16; DES mCX & PTCA OM1 09/29/20   Chronic diastolic (congestive) heart failure (HCC)    Chronic pain    Chronic systolic (congestive) heart failure (HCC)    CKD (chronic kidney disease)    Contrast dye induced nephropathy, possible 09/03/2020   COPD (chronic obstructive pulmonary disease) (HCC)    COVID-19 virus infection 03/03/2023   Diabetes (HCC)type 1    Diastolic CHF (HCC)    Dyspnea    Family history of breast cancer 10/23/2021   Gastro-esophageal reflux disease without esophagitis    Hyperlipidemia    Hypertension    Hypertensive heart disease with heart failure (HCC)    Hypoxemia    Idiopathic gout, multiple sites    Leukocytosis 03/29/2022   Localized edema    Low back pain    Mitral regurgitation    Mitral regurgitation    Mixed hyperlipidemia    Mixed incontinence    Morbid (severe) obesity due to excess calories (HCC)    Neuromuscular disorder (HCC)    Neuropathy    Non-ST elevation (NSTEMI) myocardial infarction (HCC)    08/29/20, 09/29/20   OSA (obstructive sleep apnea) 09/03/2020   Other specified hypothyroidism    PAF (paroxysmal atrial  fibrillation) (HCC)    s/p pulmonary vein isolation by cryoablation 10/04/15 Dr. Rudolpho Sevin in [facility]   PEA (Pulseless electrical activity) [facility])    ~ 01/13/21 at [facility] during admission DKA, volume overload, possible CAP, hypoxia s/p ACLS with ROSC ~ 10-15   Pneumonia    Primary insomnia    Rheumatoid arthritis (HCC)    Stroke (HCC)    Thyroid disease    Type 1 diabetes mellitus (HCC)    Vitamin D deficiency     Allergies  Allergen Reactions   Naproxen Hives and Rash   Shellfish-Derived Products Hives, Swelling and Other (See Comments)    Angioedema    Strawberry (Diagnostic) Hives   Celecoxib Other (See Comments)    Stomach pain   Glucosamine Hives, Swelling and Other (See Comments)    Angioedema    Iron Hives and Other (See Comments)    IV iron transfusion - hives - Feb 2022 hospitalization  Patient said she CAN tolerate if given Benadryl first   Metoclopramide Other (See Comments)    Facial twitching and stuttering   Metolazone Other (See Comments)    Acute renal failure   Statins Other (See Comments)    Muscle pain  Objective:  Dana [name] is a pleasant 62 y.o. female in NAD. AAO x 3.  Vascular Examination: Patient has palpable DP pulse, absent PT pulse bilateral.  Delayed capillary refill bilateral toes.  Sparse digital hair bilateral.  Proximal to distal cooling WNL bilateral.    Dermatological Examination: Interspaces are clear with no open lesions noted bilateral.  Skin is shiny and atrophic bilateral.  Nails are 3-21mm thick, with yellowish/brown discoloration, subungual debris and distal onycholysis x8.  There is pain with compression of nails x8.       Latest Ref Rng & Units 05/14/2023   11:23 AM 02/13/2023    2:02 AM  Hemoglobin A1C  Hemoglobin-A1c 4.8 - 5.6 % 9.0  8.5    Patient qualifies for at-risk foot care because of previous toe amputations (Q7) .  Assessment/Plan: 1. Pain due to onychomycosis of toenails of both feet   2. History  of amputation of hallux (HCC)   3. Type 2 diabetes mellitus with peripheral neuropathy (HCC)    Mycotic nails x8 were sharply debrided with sterile nail nippers and power debriding burr to decrease bulk and length.  F/u 3 months   Dana [name] DBurna Mortimer, DPM, FACFAS [facility]     [address] [place], [place] [address]                Office [phone]  Fax [phone]

## 2023-10-04 NOTE — Assessment & Plan Note (Signed)
 Significant sleep difficulty, exacerbated by stressors. Avoided Ambien due to side effects. Chose doxepin for insomnia, anxiety, and depression. - Prescribe doxepin 30 minutes before bedtime, avoid meals within three hours. - Send prescription to [facility] pharmacy. - Advise to avoid alcohol while taking doxepin. - Instruct to monitor effectiveness and report persistent insomnia. - Discussed good sleep hygiene  - FU on Monday if she she is still having trouble sleeping

## 2023-10-04 NOTE — Progress Notes (Signed)
 Subjective:  Patient ID: Dana [name], female    DOB: [birth year]  Age: 63 y.o. MRN: [id number]  Chief Complaint  Patient presents with   Depression   Discussed the use of AI scribe software for clinical note transcription with the patient, who gave verbal consent to proceed.  HPI Dana [name] is a 63 year old female with depression who presents with insomnia and anxiety.  She has been experiencing significant insomnia for the past six weeks, sleeping only two to three hours per night, and sometimes not at all. She has tried using a heating pad.  Anxiety and depression are prominent, with current medications including daily buspirone and almost daily lorazepam. She has a history of using Prozac but cannot recall its effectiveness due to memory issues following an occipital brain injury two years ago. She also takes magnesium oxide, which she believes is for low magnesium levels, though she is unsure of its impact on her sleep.  Recent life stressors have exacerbated her insomnia and anxiety. Her 25 year old mother moved in with her five months ago after a hip fracture and rehabilitation. Her husband's new job with long hours has led to increased tension and arguments at home. Additionally, a recent refrigerator malfunction has added to her stress.  She experiences daily headaches that have persisted over the past week, for which she uses Tylenol with occasional relief. No chest pain or shortness of breath is noted, but she does experience palpitations when anxious.  Her past medical history includes an occipital brain injury two years ago, resulting in some long-term memory loss, and congestive heart failure. She is aware of potential medication interactions, particularly with her pain medication, hydrocodone, and other medications like gabapentin and lorazepam.      10/04/2023   10:55 AM 07/29/2023   10:57 AM 07/12/2023   10:39 AM 05/13/2023    3:54 PM 04/04/2023   10:52 AM   Depression screen PHQ 2/9  Decreased Interest 1 0 3 1 0  Down, Depressed, Hopeless 2 0 2 0 2  PHQ - 2 Score 3 0 5 1 2   Altered sleeping 3 3 3  3   Tired, decreased energy 3 1 3  3   Change in appetite 3 0 3  3  Feeling bad or failure about yourself  2 2 0  2  Trouble concentrating 1 1 0  2  Moving slowly or fidgety/restless 0 0 0  1  Suicidal thoughts 0 0 0  0  PHQ-9 Score 15 7 14  16   Difficult doing work/chores Extremely dIfficult Somewhat difficult Somewhat difficult  Somewhat difficult        10/04/2023   10:56 AM 07/29/2023   10:58 AM 04/04/2023   10:52 AM 12/17/2022    3:42 PM  GAD 7 : Generalized Anxiety Score  Nervous, Anxious, on Edge 2 3 2 2   Control/stop worrying 3 3 3 3   Worry too much - different things 3 3 1 3   Trouble relaxing 3 3 3 2   Restless 2 1 0 0  Easily annoyed or irritable 3 1 2 3   Afraid - awful might happen 2 1 2 2   Total GAD 7 Score 18 15 13 15   Anxiety Difficulty Very difficult Somewhat difficult Very difficult Somewhat difficult        08/01/2023    3:59 PM  Fall Risk   Falls in the past year? 1  Number falls in past yr: 0  Injury with Fall? 0  Risk for  fall due to : No Fall Risks  Follow up Falls evaluation completed    Patient Care Team: Blane Ohara, MD as PCP - General (Family Medicine) Bensimhon, Bevelyn Buckles, MD as PCP - Cardiology (Cardiology) Marcelyn Bruins, MD as Consulting Physician (Allergy) Bensimhon, Bevelyn Buckles, MD as Consulting Physician (Cardiology) Beverly Gust, MD as Referring Physician (Gastroenterology) Doristine Bosworth., MD as Consulting Physician (Endocrinology) Margarette Canada., DPM as Consulting Physician (Podiatry) Donnetta Hail, MD as Consulting Physician (Rheumatology) Barnabas Lister, MD as Referring Physician (Nephrology) Orbie Pyo, MD as Consulting Physician (Cardiology) Dellia Beckwith, MD as Consulting Physician (Oncology) Marisue Brooklyn, MD as Referring Physician Doristine Bosworth., MD (Endocrinology) Randa Spike Kelton Pillar, LCSW as [facility] Care Management (Licensed Clinical Social Worker) Ricky Stabs, RN as [facility] Care Management (General Practice) Chrys Racer (Ophthalmology)   Review of Systems  Constitutional:  Positive for fatigue. Negative for chills, diaphoresis and fever.  HENT:  Negative for congestion, ear pain and sinus pain.   Eyes:  Negative for pain, discharge and itching.  Respiratory:  Negative for cough, shortness of breath and wheezing.   Cardiovascular:  Positive for palpitations. Negative for chest pain.  Gastrointestinal:  Negative for abdominal pain, constipation, diarrhea, nausea and vomiting.  Endocrine: Negative for cold intolerance and heat intolerance.  Genitourinary:  Negative for dysuria, frequency and urgency.  Musculoskeletal:  Negative for arthralgias.  Skin: Negative.   Allergic/Immunologic: Negative.   Neurological:  Positive for headaches. Negative for dizziness, weakness and light-headedness.  Psychiatric/Behavioral:  Positive for agitation and sleep disturbance. Negative for dysphoric mood. The patient is nervous/anxious.     Current Outpatient Medications on File Prior to Visit  Medication Sig Dispense Refill   acetaminophen (TYLENOL) 500 MG tablet Take 1,000 mg by mouth every 6 (six) hours as needed for mild pain (pain score 1-3) or headache.     allopurinol (ZYLOPRIM) 100 MG tablet Take 0.5 tablets (50 mg total) by mouth daily. 90 tablet 1   aspirin EC 81 MG tablet Take 1 tablet (81 mg total) by mouth daily. Swallow whole. 30 tablet 12   carvedilol (COREG) 6.25 MG tablet Take 1.5 tablets (9.375 mg total) by mouth 2 (two) times daily. 90 tablet 3   Continuous Glucose Sensor (FREESTYLE LIBRE 2 SENSOR) MISC Inject 1 device into skin every 14 (fourteen) days. 2 each 2   EPINEPHrine 0.3 mg/0.3 mL IJ SOAJ injection Use as directed for life-threatening allergic reaction. 4 each 3   Evolocumab (REPATHA  SURECLICK) 140 MG/ML SOAJ Inject 140 mg into the skin every 14 (fourteen) days. 6 mL 1   ezetimibe (ZETIA) 10 MG tablet Take 1 tablet (10 mg total) by mouth every evening. 90 tablet 1   gabapentin (NEURONTIN) 100 MG capsule Take 1 capsule (100 mg total) by mouth 2 (two) times daily. 180 capsule 3   Glucagon (GVOKE HYPOPEN 2-PACK) 0.5 MG/0.1ML SOAJ Inject 0.5 mg into the skin daily as needed. 0.2 mL 5   HYDROcodone-acetaminophen (NORCO) 7.5-325 MG tablet Take 1 tablet by mouth 3 (three) times daily as needed. (Patient taking differently: Take 1 tablet by mouth 3 (three) times daily.) 90 tablet 0   insulin aspart (NOVOLOG FLEXPEN) 100 UNIT/ML FlexPen Inject 2 - 10 Units before meals based on SSI. 15 mL 3   insulin glargine (LANTUS SOLOSTAR) 100 UNIT/ML Solostar Pen Inject 33-35 Units into the skin daily.     Insulin Pen Needle (BD PEN NEEDLE NANO 2ND  GEN) 32G X 4 MM MISC Inject 1 each into the skin in the morning, at noon, in the evening, and at bedtime. 600 each 3   isosorbide mononitrate (IMDUR) 30 MG 24 hr tablet Take 1 tablet (30 mg total) by mouth daily. 30 tablet 3   latanoprost (XALATAN) 0.005 % ophthalmic solution Place 1 drop into both eyes at bedtime. 2.5 mL 0   linaclotide (LINZESS) 145 MCG CAPS capsule Take 1 capsule (145 mcg total) by mouth daily before breakfast. (Patient taking differently: Take 145 mcg by mouth daily as needed (for constipation).) 90 capsule 1   LORazepam (ATIVAN) 0.5 MG tablet Take 1 tablet (0.5 mg total) by mouth daily as needed for anxiety. 90 tablet 1   magnesium oxide (MAG-OX) 400 MG tablet Take 2 tablets (800 mg total) by mouth daily. Take 2 (400 mg) daily=800 mg 180 tablet 1   Menthol-Methyl Salicylate (ICY HOT EXTRA STRENGTH) 10-30 % CREA Apply 1 Application topically as needed (bilateral hips and knees).     nitroGLYCERIN (NITROSTAT) 0.4 MG SL tablet DISSOLVE ONE TABLET UNDER THE TONGUE EVERY 5 MINUTES AS NEEDED FOR CHEST PAIN.  DO NOT EXCEED A TOTAL OF 3 DOSES  IN 15 MINUTES 50 tablet 3   Omega-3 Fatty Acids (FISH OIL PO) Take 1 capsule by mouth daily.     OXYGEN Inhale 2 L/min into the lungs at bedtime.     pantoprazole (PROTONIX) 40 MG tablet Take 1 tablet (40 mg total) by mouth daily. (Patient taking differently: Take 40 mg by mouth daily before breakfast.) 90 tablet 1   potassium chloride SA (KLOR-CON M) 20 MEQ tablet Take 1 tablet (20 mEq total) by mouth daily.     Probiotic CAPS Take 1 capsule by mouth daily.     promethazine (PHENERGAN) 25 MG tablet Take 1 tablet (25 mg total) by mouth daily as needed. (Patient taking differently: Take 25 mg by mouth every 8 (eight) hours as needed for nausea or vomiting.) 90 tablet 1   ticagrelor (BRILINTA) 90 MG TABS tablet Take 1 tablet (90 mg total) by mouth 2 (two) times daily. 180 tablet 1   torsemide (DEMADEX) 20 MG tablet 3 tablets (60 mg total) daily.     No current facility-administered medications on file prior to visit.   Past Medical History:  Diagnosis Date   Acquired absence of left great toe (HCC)    Acquired absence of other left toe(s) (HCC)    ACS (acute coronary syndrome) (HCC)    Anemia    Anoxic brain injury (HCC)    Anxiety    CAD (coronary artery disease)    DES mLAD 04/07/15; DES dRCA & DES pPDA 08/30/16; DES mCX & PTCA OM1 09/29/20   Chronic diastolic (congestive) heart failure (HCC)    Chronic pain    Chronic systolic (congestive) heart failure (HCC)    CKD (chronic kidney disease)    Contrast dye induced nephropathy, possible 09/03/2020   COPD (chronic obstructive pulmonary disease) (HCC)    COVID-19 virus infection 03/03/2023   Diabetes (HCC)type 1    Diastolic CHF (HCC)    Dyspnea    Family history of breast cancer 10/23/2021   Gastro-esophageal reflux disease without esophagitis    Hyperlipidemia    Hypertension    Hypertensive heart disease with heart failure (HCC)    Hypoxemia    Idiopathic gout, multiple sites    Leukocytosis 03/29/2022   Localized edema    Low  back pain    Mitral  regurgitation    Mitral regurgitation    Mixed hyperlipidemia    Mixed incontinence    Morbid (severe) obesity due to excess calories (HCC)    Neuromuscular disorder (HCC)    Neuropathy    Non-ST elevation (NSTEMI) myocardial infarction (HCC)    08/29/20, 09/29/20   OSA (obstructive sleep apnea) 09/03/2020   Other specified hypothyroidism    PAF (paroxysmal atrial fibrillation) (HCC)    s/p pulmonary vein isolation by cryoablation 10/04/15 Dr. Rudolpho Sevin in [facility]   PEA (Pulseless electrical activity) [facility])    ~ 01/13/21 at [facility] during admission DKA, volume overload, possible CAP, hypoxia s/p ACLS with ROSC ~ 10-15   Pneumonia    Primary insomnia    Rheumatoid arthritis (HCC)    Stroke (HCC)    Thyroid disease    Type 1 diabetes mellitus (HCC)    Vitamin D deficiency    Past Surgical History:  Procedure Laterality Date   ABDOMINAL HYSTERECTOMY     Partial- Still has both ovaries   CARDIOVASCULAR STRESS TEST  08/13/2014   Nuclear; Normal   CARPAL TUNNEL RELEASE     CATARACT EXTRACTION, BILATERAL     CESAREAN SECTION     CHOLECYSTECTOMY     CORONARY ANGIOPLASTY WITH STENT PLACEMENT     3 blockages/ 1 stent   CORONARY BALLOON ANGIOPLASTY N/A 07/18/2023   Procedure: CORONARY BALLOON ANGIOPLASTY;  Surgeon: Kathleene Hazel, MD;  Location: [facility] INVASIVE CV LAB;  Service: Cardiovascular;  Laterality: N/A;   CORONARY PRESSURE/FFR STUDY N/A 09/07/2022   Procedure: INTRAVASCULAR PRESSURE WIRE/FFR STUDY;  Surgeon: Swaziland, Peter M, MD;  Location: [facility] INVASIVE CV LAB;  Service: Cardiovascular;  Laterality: N/A;   CORONARY STENT INTERVENTION N/A 09/29/2020   Procedure: CORONARY STENT INTERVENTION;  Surgeon: Tonny Bollman, MD;  Location: [facility] INVASIVE CV LAB;  Service: Cardiovascular;  Laterality: N/A;  CFX   CORONARY STENT INTERVENTION N/A 09/07/2022   Procedure: CORONARY STENT INTERVENTION;  Surgeon: Swaziland, Peter M, MD;  Location: [facility] INVASIVE CV LAB;  Service:  Cardiovascular;  Laterality: N/A;   CORONARY STENT INTERVENTION N/A 02/13/2023   Procedure: CORONARY STENT INTERVENTION;  Surgeon: Kathleene Hazel, MD;  Location: [facility] INVASIVE CV LAB;  Service: Cardiovascular;  Laterality: N/A;  LAD   CORONARY ULTRASOUND/IVUS N/A 02/13/2023   Procedure: Coronary Ultrasound/IVUS;  Surgeon: Kathleene Hazel, MD;  Location: [facility] INVASIVE CV LAB;  Service: Cardiovascular;  Laterality: N/A;   CORONARY/GRAFT ACUTE MI REVASCULARIZATION N/A 12/08/2022   Procedure: Coronary/Graft Acute MI Revascularization;  Surgeon: Marykay Lex, MD;  Location: [facility] INVASIVE CV LAB;  Service: Cardiovascular;  Laterality: N/A;   EYE SURGERY     LEFT HEART CATH AND CORONARY ANGIOGRAPHY N/A 09/29/2020   Procedure: LEFT HEART CATH AND CORONARY ANGIOGRAPHY;  Surgeon: Tonny Bollman, MD;  Location: [facility] INVASIVE CV LAB;  Service: Cardiovascular;  Laterality: N/A;   LEFT HEART CATH AND CORONARY ANGIOGRAPHY N/A 09/07/2022   Procedure: LEFT HEART CATH AND CORONARY ANGIOGRAPHY;  Surgeon: Swaziland, Peter M, MD;  Location: [facility] INVASIVE CV LAB;  Service: Cardiovascular;  Laterality: N/A;   LEFT HEART CATH AND CORONARY ANGIOGRAPHY N/A 12/08/2022   Procedure: LEFT HEART CATH AND CORONARY ANGIOGRAPHY;  Surgeon: Marykay Lex, MD;  Location: [facility] INVASIVE CV LAB;  Service: Cardiovascular;  Laterality: N/A;   LEFT HEART CATH AND CORONARY ANGIOGRAPHY N/A 02/13/2023   Procedure: LEFT HEART CATH AND CORONARY ANGIOGRAPHY;  Surgeon: Kathleene Hazel, MD;  Location: [facility] INVASIVE CV LAB;  Service: Cardiovascular;  Laterality: N/A;   LEFT HEART CATH AND CORONARY ANGIOGRAPHY N/A 07/16/2023   Procedure: LEFT HEART CATH AND CORONARY ANGIOGRAPHY;  Surgeon: Kathleene Hazel, MD;  Location: [facility] INVASIVE CV LAB;  Service: Cardiovascular;  Laterality: N/A;   RIGHT HEART CATH N/A 04/27/2021   Procedure: RIGHT HEART CATH;  Surgeon: Dolores Patty, MD;  Location: [facility] INVASIVE CV LAB;  Service:  Cardiovascular;  Laterality: N/A;   RIGHT/LEFT HEART CATH AND CORONARY ANGIOGRAPHY N/A 01/07/2017   Procedure: Right/Left Heart Cath and Coronary Angiography;  Surgeon: Laurey Morale, MD;  Location: [facility] INVASIVE CV LAB;  Service: Cardiovascular;  Laterality: N/A;   ROTATOR CUFF REPAIR     TEE WITHOUT CARDIOVERSION N/A 04/28/2021   Procedure: TRANSESOPHAGEAL ECHOCARDIOGRAM (TEE);  Surgeon: Dolores Patty, MD;  Location: [facility] ENDOSCOPY;  Service: Cardiovascular;  Laterality: N/A;   TEE WITHOUT CARDIOVERSION N/A 05/18/2021   Procedure: TRANSESOPHAGEAL ECHOCARDIOGRAM (TEE);  Surgeon: Orbie Pyo, MD;  Location: [facility] INVASIVE CV LAB;  Service: Cardiovascular;  Laterality: N/A;   TOE AMPUTATION Left    TRANSCATHETER MITRAL EDGE TO EDGE REPAIR N/A 05/18/2021   Procedure: MITRAL VALVE REPAIR;  Surgeon: Orbie Pyo, MD;  Location: [facility] INVASIVE CV LAB;  Service: Cardiovascular;  Laterality: N/A;   US ECHOCARDIOGRAPHY  05/2017   Normal    Family History  Problem Relation Age of Onset   Breast cancer Mother 49       contralateral breast ca dx 38   Coronary artery disease Mother    Hypertension Mother    Hyperlipidemia Mother    Diabetes type II Mother    Lung cancer Mother 63   Diabetes Father    Hypertension Father    Mental illness Sister    Bipolar disorder Other    Depression Other    Schizophrenia Other    Cancer Other        MGF's sisters x2; unknown type   Social History   Socioeconomic History   Marital status: Married    Spouse name: [name]   Number of children: 2   Years of education: 16   Highest education level: Master's degree (e.g., MA, MS, MEng, MEd, MSW, MBA)  Occupational History   Occupation: on disability/retired early former Engineer, site  Tobacco Use   Smoking status: Former    Current packs/day: 0.00    Types: Cigarettes    Start date: 76    Quit date: 1984    Years since quitting: 41.2    Passive exposure: Past   Smokeless tobacco: Never  Vaping  Use   Vaping status: Never Used  Substance and Sexual Activity   Alcohol use: Never   Drug use: Never   Sexual activity: Not on file  Other Topics Concern   Not on file  Social History Narrative   Not on file   Social Drivers of Health   Financial Resource Strain: Low Risk  (07/01/2023)   Overall Financial Resource Strain (CARDIA)    Difficulty of Paying Living Expenses: Not hard at all  Food Insecurity: No Food Insecurity (08/30/2023)   Hunger Vital Sign    Worried About Running Out of Food in the Last Year: Never true    Ran Out of Food in the Last Year: Never true  Transportation Needs: No Transportation Needs (08/30/2023)   PRAPARE - Administrator, Civil Service (Medical): No    Lack of Transportation (Non-Medical): No  Physical Activity: Insufficiently Active (07/01/2023)   Exercise Vital Sign  Days of Exercise per Week: 5 days    Minutes of Exercise per Session: 20 min  Stress: Stress Concern Present (07/01/2023)   [employer] of Occupational Health - Occupational Stress Questionnaire    Feeling of Stress : To some extent  Social Connections: Moderately Integrated (07/25/2022)   Social Connection and Isolation Panel [NHANES]    Frequency of Communication with Friends and Family: More than three times a week    Frequency of Social Gatherings with Friends and Family: More than three times a week    Attends Religious Services: More than 4 times per year    Active Member of [employer] or Organizations: No    Attends Engineer, structural: Never    Marital Status: Married    Objective:  BP 120/62   Pulse 78   Temp 98 F (36.7 C) (Temporal)   Resp 16   Ht 5\' 4"  (1.626 m)   Wt 221 lb (100.2 kg)   SpO2 96%   BMI 37.93 kg/m      10/04/2023    9:56 AM 09/16/2023   11:08 AM 08/29/2023    6:02 AM  BP/Weight  Systolic BP 120 126 144  Diastolic BP 62 82 49  Wt. (Lbs) 221 225   BMI 37.93 kg/m2 38.62 kg/m2     Physical Exam Vitals reviewed.   Constitutional:      General: She is not in acute distress.    Appearance: Normal appearance. She is not ill-appearing.  Eyes:     Conjunctiva/sclera: Conjunctivae normal.  Cardiovascular:     Rate and Rhythm: Normal rate and regular rhythm.     Heart sounds: Normal heart sounds. No murmur heard. Pulmonary:     Effort: Pulmonary effort is normal.     Breath sounds: Normal breath sounds. No wheezing.  Musculoskeletal:        General: Normal range of motion.  Skin:    General: Skin is warm.  Neurological:     Mental Status: She is alert. Mental status is at baseline.  Psychiatric:        Attention and Perception: Attention normal.        Mood and Affect: Affect is tearful.        Speech: Speech normal.        Behavior: Behavior normal.        Thought Content: Thought content normal.    Lab Results  Component Value Date   WBC 11.1 (H) 08/29/2023   HGB 9.7 (L) 08/29/2023   HCT 30.2 (L) 08/29/2023   PLT 234 08/29/2023   GLUCOSE 103 (H) 08/29/2023   CHOL 92 (L) 07/12/2023   TRIG 81 07/12/2023   HDL 47 07/12/2023   LDLCALC 28 07/12/2023   ALT 14 08/28/2023   AST 12 (L) 08/28/2023   NA 139 08/29/2023   K 3.9 08/29/2023   CL 100 08/29/2023   CREATININE 2.38 (H) 08/29/2023   BUN 50 (H) 08/29/2023   CO2 28 08/29/2023   TSH 2.900 01/19/2022   INR 1.1 05/16/2021   HGBA1C 9.0 (H) 05/14/2023      Assessment & Plan:   Situational insomnia Assessment & Plan: Significant sleep difficulty, exacerbated by stressors. Avoided Ambien due to side effects. Chose doxepin for insomnia, anxiety, and depression. - Prescribe doxepin 30 minutes before bedtime, avoid meals within three hours. - Send prescription to [facility] pharmacy. - Advise to avoid alcohol while taking doxepin. - Instruct to monitor effectiveness and report persistent insomnia. - Discussed good sleep  hygiene  - FU on Monday if she she is still having trouble sleeping   Orders: -     Doxepin HCl; Take 1 capsule  (150 mg total) by mouth at bedtime.  Dispense: 30 capsule; Refill: 0  GAD (generalized anxiety disorder) Assessment & Plan: Managed with buspirone and lorazepam. Influenced by life changes. Considering buspirone dosage increase.    10/04/2023   10:56 AM 07/29/2023   10:58 AM 04/04/2023   10:52 AM 12/17/2022    3:42 PM  GAD 7 : Generalized Anxiety Score  Nervous, Anxious, on Edge 2 3 2 2   Control/stop worrying 3 3 3 3   Worry too much - different things 3 3 1 3   Trouble relaxing 3 3 3 2   Restless 2 1 0 0  Easily annoyed or irritable 3 1 2 3   Afraid - awful might happen 2 1 2 2   Total GAD 7 Score 18 15 13 15   Anxiety Difficulty Very difficult Somewhat difficult Very difficult Somewhat difficult   - Increase buspirone to 15 mg three times a day. - Send updated prescription to [facility] pharmacy. - Continue counseling sessions, encourage husband's participation.  Orders: -     busPIRone HCl; Take 1 tablet (15 mg total) by mouth 3 (three) times daily.  Dispense: 90 tablet; Refill: 0  Moderately severe major depression (HCC) Assessment & Plan: Not well controlled    10/04/2023   10:55 AM 07/29/2023   10:57 AM 07/12/2023   10:39 AM  PHQ9 SCORE ONLY  PHQ-9 Total Score 15 7 14   -She states that she spoke to her counselor today before her appointment and has an appointment on Monday. -Patient signed a no harm contract - copy in her chart.      Meds ordered this encounter  Medications   doxepin (SINEQUAN) 150 MG capsule    Sig: Take 1 capsule (150 mg total) by mouth at bedtime.    Dispense:  30 capsule    Refill:  0   busPIRone (BUSPAR) 15 MG tablet    Sig: Take 1 tablet (15 mg total) by mouth 3 (three) times daily.    Dispense:  90 tablet    Refill:  0    No orders of the defined types were placed in this encounter.    Follow-up: Return if symptoms worsen or fail to improve.  An After Visit Summary was printed and given to the patient.  Total time spent on today's visit  was 45 minutes, including both face-to-face time and nonface-to-face time personally spent on review of chart (labs and imaging), discussing labs and goals, discussing further work-up, treatment options, referrals to specialist if needed, reviewing outside records if pertinent, answering patient's questions, and coordinating care.    Lajuana Matte, FNP [facility] [phone]

## 2023-10-04 NOTE — Assessment & Plan Note (Signed)
 Managed with buspirone and lorazepam. Influenced by life changes. Considering buspirone dosage increase.    10/04/2023   10:56 AM 07/29/2023   10:58 AM 04/04/2023   10:52 AM 12/17/2022    3:42 PM  GAD 7 : Generalized Anxiety Score  Nervous, Anxious, on Edge 2 3 2 2   Control/stop worrying 3 3 3 3   Worry too much - different things 3 3 1 3   Trouble relaxing 3 3 3 2   Restless 2 1 0 0  Easily annoyed or irritable 3 1 2 3   Afraid - awful might happen 2 1 2 2   Total GAD 7 Score 18 15 13 15   Anxiety Difficulty Very difficult Somewhat difficult Very difficult Somewhat difficult   - Increase buspirone to 15 mg three times a day. - Send updated prescription to [facility] pharmacy. - Continue counseling sessions, encourage husband's participation.

## 2023-10-04 NOTE — Telephone Encounter (Signed)
 Patient reports she is in office "room " now waiting for Dr to come in . Recommended patient wait for provider to come in for evaluation and medication review.  Chief Complaint: depression Symptoms: crying , reports overwhelmed, depressed. Medications not helping Frequency: na Pertinent Negatives: Patient denies na Disposition: [] ED /[] Urgent Care (no appt availability in office) / [] Appointment(In office/virtual)/ []  [facility]/ [] Home Care/ [] Refused Recommended Disposition /[]  Mobile Bus/ [x]  Follow-up with PCP Additional Notes:   Patient reports she is at clinic now.     Copied from CRM [id number]. Topic: Clinical - Pink Word Triage >> Oct 04, 2023  8:40 AM Almira Coaster wrote: Reason for Triage: Patient is calling because the last few days she has been feeling overwhelmed, she been feeling depressed and her medication isn't helping anymore. Best call back number is [phone]. Reason for Disposition  [1] Depression AND [2] worsening (e.g., sleeping poorly, less able to do activities of daily living)  Answer Assessment - Initial Assessment Questions 1. CONCERN: "What happened that made you call today?"     Patient already in office room for evaluation 2. DEPRESSION SYMPTOM SCREENING: "How are you feeling overall?" (e.g., decreased energy, increased sleeping or difficulty sleeping, difficulty concentrating, feelings of sadness, guilt, hopelessness, or worthlessness)     Overwhelmed depressed 3. RISK OF HARM - SUICIDAL IDEATION:  "Do you ever have thoughts of hurting or killing yourself?"  (e.g., yes, no, no but preoccupation with thoughts about death)   - INTENT:  "Do you have thoughts of hurting or killing yourself right NOW?" (e.g., yes, no, N/A)   - PLAN: "Do you have a specific plan for how you would do this?" (e.g., gun, knife, overdose, no plan, N/A)     na 4. RISK OF HARM - HOMICIDAL IDEATION:  "Do you ever have thoughts of hurting or killing someone else?"   (e.g., yes, no, no but preoccupation with thoughts about death)   - INTENT:  "Do you have thoughts of hurting or killing someone right NOW?" (e.g., yes, no, N/A)   - PLAN: "Do you have a specific plan for how you would do this?" (e.g., gun, knife, no plan, N/A)      na 5. FUNCTIONAL IMPAIRMENT: "How have things been going for you overall? Have you had more difficulty than usual doing your normal daily activities?"  (e.g., better, same, worse; self-care, school, work, interactions)     na 6. SUPPORT: "Who is with you now?" "Who do you live with?" "Do you have family or friends who you can talk to?"      In office now  7. THERAPIST: "Do you have a counselor or therapist? Name?"     na 8. STRESSORS: "Has there been any new stress or recent changes in your life?"     na 9. ALCOHOL USE OR SUBSTANCE USE (DRUG USE): "Do you drink alcohol or use any illegal drugs?"     na 10. OTHER: "Do you have any other physical symptoms right now?" (e.g., fever)       na 11. PREGNANCY: "Is there any chance you are pregnant?" "When was your last menstrual period?"       na  Protocols used: Depression-A-AH

## 2023-10-04 NOTE — Telephone Encounter (Signed)
  Chief Complaint: Depression - overwhelmed Symptoms: crying, depression Frequency: ongoing gotten worse the past few months Pertinent Negatives: Patient denies self harm - not feeling safe Disposition: [] ED /[] Urgent Care (no appt availability in office) / [x] Appointment(In office/virtual)/ []  [facility]/ [] Home Care/ [] Refused Recommended Disposition /[]  Mobile Bus/ []  Follow-up with PCP Additional Notes: Pt states that she and her husband are always bickering since her mother moved in. She is very depressed and medications are not working. Pt has a therapist, and will call her after this call ends. Called [facility] s/[facility] - Appt with Lajuana Matte for this morning.    Copied From CRM [id number]. Reason for Triage: Patient is calling because the last few days she has been feeling overwhelmed, she been feeling depressed and her medication isn't helping anymore. Best call back number is [phone].  Reason for Disposition  [1] Depression AND [2] worsening (e.g., sleeping poorly, less able to do activities of daily living)  Answer Assessment - Initial Assessment Questions 1. CONCERN: "What happened that made you call today?"     Fighting with husband - mom moved in 2. DEPRESSION SYMPTOM SCREENING: "How are you feeling overall?" (e.g., decreased energy, increased sleeping or difficulty sleeping, difficulty concentrating, feelings of sadness, guilt, hopelessness, or worthlessness)     Depressed - overwhelmed 3. RISK OF HARM - SUICIDAL IDEATION:  "Do you ever have thoughts of hurting or killing yourself?"  (e.g., yes, no, no but preoccupation with thoughts about death)   - INTENT:  "Do you have thoughts of hurting or killing yourself right NOW?" (e.g., yes, no, N/A)   - PLAN: "Do you have a specific plan for how you would do this?" (e.g., gun, knife, overdose, no plan, N/A)     no 4. RISK OF HARM - HOMICIDAL IDEATION:  "Do you ever have thoughts of hurting or killing someone  else?"  (e.g., yes, no, no but preoccupation with thoughts about death)   - INTENT:  "Do you have thoughts of hurting or killing someone right NOW?" (e.g., yes, no, N/A)   - PLAN: "Do you have a specific plan for how you would do this?" (e.g., gun, knife, no plan, N/A)      no 5. FUNCTIONAL IMPAIRMENT: "How have things been going for you overall? Have you had more difficulty than usual doing your normal daily activities?"  (e.g., better, same, worse; self-care, school, work, interactions)     A lot of recent changes 6. SUPPORT: "Who is with you now?" "Who do you live with?" "Do you have family or friends who you can talk to?"      no 7. THERAPIST: "Do you have a counselor or therapist? Name?"     no 8. STRESSORS: "Has there been any new stress or recent changes in your life?"     Fight with husband 9. ALCOHOL USE OR SUBSTANCE USE (DRUG USE): "Do you drink alcohol or use any illegal drugs?"     no 10. OTHER: "Do you have any other physical symptoms right now?" (e.g., fever)       Not sleeping - HA  Protocols used: Depression-A-AH

## 2023-10-04 NOTE — Assessment & Plan Note (Signed)
 Not well controlled    10/04/2023   10:55 AM 07/29/2023   10:57 AM 07/12/2023   10:39 AM  PHQ9 SCORE ONLY  PHQ-9 Total Score 15 7 14   -She states that she spoke to her counselor today before her appointment and has an appointment on Monday. -Patient signed a no harm contract - copy in her chart.

## 2023-10-07 ENCOUNTER — Encounter (HOSPITAL_COMMUNITY)

## 2023-10-07 DIAGNOSIS — F411 Generalized anxiety disorder: Secondary | ICD-10-CM | POA: Diagnosis not present

## 2023-10-08 ENCOUNTER — Encounter: Payer: Self-pay | Admitting: Nephrology

## 2023-10-08 ENCOUNTER — Encounter

## 2023-10-09 ENCOUNTER — Other Ambulatory Visit (HOSPITAL_COMMUNITY): Payer: Self-pay

## 2023-10-09 ENCOUNTER — Other Ambulatory Visit: Payer: Self-pay | Admitting: *Deleted

## 2023-10-09 NOTE — Patient Outreach (Signed)
 Care Management   Visit Note  10/09/2023 Name: Dana [name] MRN: [id number] DOB: 09-27-1960  Subjective: Dana [name] is a 63 y.o. year old female who is a primary care patient of Cox, Kirsten, MD. The Care Management team was consulted for assistance.      Engaged with patient spoke with patient by telephone.    Goals Addressed             This Visit's Progress    RNCM Care Management Expected Outcome:  Monitor, Self-Manage and Reduce Symptoms of Diabetes       Current Barriers:  Chronic Disease Management support and education needs related to effective management of DM Lab Results  Component Value Date   HGBA1C 9.0 (H) 05/14/2023    Planned Interventions: Provided education to patient about basic DM disease process. Fasting glucose 98 this morning, highest: 283 lowest: 53. Patient knowledgeable about hypoglycemic education. Reports continued use with Libre. Education provided. Reviewed medications with patient and discussed importance of medication adherence. Patient states compliance with medications. Reviewed prescribed diet with patient heart healthy/ADA diet. Is compliant of heart healthy/ADA diet.  Counseled on importance of regular laboratory monitoring as prescribed. Has regular lab work done.  Discussed plans with patient for ongoing care management follow up and provided patient with direct contact information for care management team;      Provided patient with written educational materials related to hypo and hyperglycemia and importance of correct treatment.      Reviewed scheduled/upcoming provider appointments including: 02-27-24 with Endocrinology Advised patient, providing education and rationale, to check cbg twice daily and when you have symptoms of low or high blood sugar and record. Has a continuous reader.       call provider for findings outside established parameters;       Referral made to community resources care guide team for assistance with food  insecurities and resources in her area to help with expressed needs. Has talked with care guides and had resources mailed.  ;      Review of patient status, including review of consultants reports, relevant laboratory and other test results, and medications completed;       Advised patient to discuss changes in DM, questions, and concerns with provider;      Screening for signs and symptoms of depression related to chronic disease state;        Assessed social determinant of health barriers;    Eye exam scheduled for August 2025      Symptom Management: Take medications as prescribed   Attend all scheduled provider appointments Call provider office for new concerns or questions  call the Suicide and Crisis Lifeline: 988 call the [place] National Suicide Prevention Lifeline: (305)415-4169 or TTY: (508) 826-8811 TTY 8054366105) to talk to a trained counselor call 1-800-273-TALK (toll free, 24 hour hotline) if experiencing a Mental Health or Behavioral Health Crisis  keep appointment with eye doctor check feet daily for cuts, sores or redness trim toenails straight across manage portion size wash and dry feet carefully every day wear comfortable, cotton socks wear comfortable, well-fitting shoes  Follow Up Plan: Telephone follow up appointment with care management team member scheduled for: 11-11-2023 at 145 pm       RNCM Care Management Expected Outcome:  Monitor, Self-Manage and Reduce Symptoms of: Depression, Anxiety, Stress       Current Barriers:  Knowledge Deficits related to resources available to help with expressed needs at this time due to her husband losing his  job and his unemployment has expired Care Coordination needs related to resources to help meet financial needs at this time in a patient with stress, anxiety, and depression  Chronic Disease Management support and education needs related to effective management of stress, anxiety, and depression Corporate treasurer.    Planned Interventions: Evaluation of current treatment plan related to depression, stress, and anxiety and patient's adherence to plan as established by provider. Patient reports that she has been under a lot of stress recently and reports that her provider increased some of her medications with effectiveness.  Reviewed medications with patient and discussed compliance. The patient is compliant with medications.  Provided patient with care guide and SW support as well as support from the interdisciplinary team for management of stress, anxiety, and depression  educational materials related to effective management of stress, anxiety, and depression. Continues to decline LCSW referral at this time. Reviewed scheduled/upcoming provider appointments including  Care Guide referral for community resources for [facility] for food insecurities and assistance with utilities. Has information from the care guides  Social Work referral for education and support and resources for effective management of stress, anxiety, and depression.  Discussed plans with patient for ongoing care management follow up and provided patient with direct contact information for care management team Advised patient to discuss changes in mood, increased anxiety, depression, stress, or other mental health needs with provider Screening for signs and symptoms of depression related to chronic disease state  Assessed social determinant of health barriers  Symptom Management: Take medications as prescribed   Attend all scheduled provider appointments Call provider office for new concerns or questions  call the Suicide and Crisis Lifeline: 988 call the [place] National Suicide Prevention Lifeline: 406-195-8589 or TTY: 581-790-3387 TTY 6051930119) to talk to a trained counselor call 1-800-273-TALK (toll free, 24 hour hotline) if experiencing a Mental Health or Behavioral Health Crisis   Follow Up Plan: Telephone follow up  appointment with care management team member scheduled for: 11-11-2023 at 145 pm       RNCM Care Management Expected Outcome:  Monitor, Self-Manage, and Reduce Symptoms of Hypertension       Current Barriers:  Chronic Disease Management support and education needs related to effective management of HTN BP Readings from Last 3 Encounters:  10/04/23 120/62  09/16/23 126/82  08/29/23 (!) 144/49   Wt Readings from Last 3 Encounters:  10/04/23 221 lb (100.2 kg)  09/16/23 225 lb (102.1 kg)  08/28/23 218 lb 4.1 oz (99 kg)    Planned Interventions: Evaluation of current treatment plan related to hypertension self management and patient's adherence to plan as established by provider. Regularly checks blood pressure and reports that they are stable. This morning BP 120s/60s. Denies any chest pain, dizziness. She does endorse some mild swelling that she states is normal. She does wear compression stockings and some headaches Provided education to patient re: stroke prevention, s/s of heart attack and stroke. Education and support provided Reviewed prescribed diet heart healthy/ADA diet.  Reviewed medications with patient and discussed importance of compliance. Reports compliance with all medications. Discussed plans with patient for ongoing care management follow up and provided patient with direct contact information for care management team; Advised patient, providing education and rationale, to monitor blood pressure daily and record, calling PCP for findings outside established parameters.  Reviewed scheduled/upcoming provider appointments including: 10-15-23 with Cardiologist Advised patient to discuss changes in her blood pressures or heart health  with provider; Provided education on prescribed diet  heart healthy/ADA diet.  Discussed complications of poorly controlled blood pressure such as heart disease, stroke, circulatory complications, vision complications, kidney impairment, sexual  dysfunction;  Screening for signs and symptoms of depression related to chronic disease state;  Assessed social determinant of health barriers;   Symptom Management: Take medications as prescribed   Attend all scheduled provider appointments Call provider office for new concerns or questions  call the Suicide and Crisis Lifeline: 988 call the [place] National Suicide Prevention Lifeline: 208 119 6137 or TTY: 323-520-5158 TTY 639-421-9349) to talk to a trained counselor call 1-800-273-TALK (toll free, 24 hour hotline) if experiencing a Mental Health or Behavioral Health Crisis  check blood pressure 3 times per week write blood pressure results in a log or diary learn about high blood pressure keep a blood pressure log take blood pressure log to all doctor appointments call doctor for signs and symptoms of high blood pressure develop an action plan for high blood pressure keep all doctor appointments take medications for blood pressure exactly as prescribed report new symptoms to your doctor  Follow Up Plan: Telephone follow up appointment with care management team member scheduled for: 11-11-2023 at 145 pm       RNCM Care Management Expected Outcomes: Monitor, Self-Manage and Reduce Symptoms of: CHF       Current Barriers:  Chronic Disease Management support and education needs related to CHF   RNCM Clinical Goal(s):  Patient will verbalize basic understanding of  CHF disease process and self health management plan as evidenced by verbal explanation, recognizing symptoms, lifestyle modifications take all medications exactly as prescribed and will call provider for medication related questions as evidenced by compliance with all medications attend all scheduled medical appointments: with primary care provider and specialist as evidenced by keeping all scheduled appointments demonstrate Improved and Ongoing adherence to prescribed treatment plan for CHF as evidenced by consistent  medication compliance, symptom monitoring, lifestyle modifications, daily weights continue to work with RN Care Manager to address care management and care coordination needs related to  CHF as evidenced by adherence to CM Team Scheduled appointments through collaboration with RN Care manager, provider, and care team.   Interventions: Evaluation of current treatment plan related to  self management and patient's adherence to plan as established by provider   Heart Failure Interventions:  (Status:  Goal on track:  Yes.) Long Term Goal Basic overview and discussion of pathophysiology of Heart Failure reviewed. Patient knowledgeable about disease process and when to seek help. Reports mild swelling that she states in normal and she does wear compression stockings, shortness of breath at this time. Weighs daily Provided education on low sodium diet. Reports compliance Reviewed Heart Failure Action Plan in depth and provided written copy Assessed need for readable accurate scales in home Provided education about placing scale on hard, flat surface Advised patient to weigh each morning after emptying bladder Discussed importance of daily weight and advised patient to weigh and record daily. Weight 225 lbs. Average weight 222-223 lbs  Reviewed role of diuretics in prevention of fluid overload and management of heart failure; Discussed the importance of keeping all appointments with provider Provided patient with education about the role of exercise in the management of heart failure Advised patient to discuss any new changes with provider Screening for signs and symptoms of depression related to chronic disease state  Assessed social determinant of health barriers   Patient Goals/Self-Care Activities: Take all medications as prescribed Attend all scheduled provider appointments Call pharmacy for medication  refills 3-7 days in advance of running out of medications Perform all self care activities  independently  Call provider office for new concerns or questions  call office if I gain more than 2 pounds in one day or 5 pounds in one week keep legs up while sitting watch for swelling in feet, ankles and legs every day eat more whole grains, fruits and vegetables, lean meats and healthy fats  Follow Up Plan:  Telephone follow up appointment with care management team member scheduled for:  11-11-2023 at 1:45 pm           Consent to Services:  Patient was given information about care management services, agreed to services, and gave verbal consent to participate.   Plan: Telephone follow up appointment with care management team member scheduled for:11-11-2023 at 1:45 pm  Larey Brick, BSN RN [facility], [facility] RN Care Manager Direct Dial: [phone]  Fax: [phone]

## 2023-10-09 NOTE — Patient Instructions (Signed)
 Visit Information  Thank you for taking time to visit with me today. Please don't hesitate to contact me if I can be of assistance to you before our next scheduled telephone appointment.  Following are the goals we discussed today:   Goals Addressed             This Visit's Progress    RNCM Care Management Expected Outcome:  Monitor, Self-Manage and Reduce Symptoms of Diabetes       Current Barriers:  Chronic Disease Management support and education needs related to effective management of DM Lab Results  Component Value Date   HGBA1C 9.0 (H) 05/14/2023    Planned Interventions: Provided education to patient about basic DM disease process. Fasting glucose 98 this morning, highest: 283 lowest: 53. Patient knowledgeable about hypoglycemic education. Reports continued use with Libre. Education provided. Reviewed medications with patient and discussed importance of medication adherence. Patient states compliance with medications. Reviewed prescribed diet with patient heart healthy/ADA diet. Is compliant of heart healthy/ADA diet.  Counseled on importance of regular laboratory monitoring as prescribed. Has regular lab work done.  Discussed plans with patient for ongoing care management follow up and provided patient with direct contact information for care management team;      Provided patient with written educational materials related to hypo and hyperglycemia and importance of correct treatment.      Reviewed scheduled/upcoming provider appointments including: 02-27-24 with Endocrinology Advised patient, providing education and rationale, to check cbg twice daily and when you have symptoms of low or high blood sugar and record. Has a continuous reader.       call provider for findings outside established parameters;       Referral made to community resources care guide team for assistance with food insecurities and resources in her area to help with expressed needs. Has talked with care  guides and had resources mailed.  ;      Review of patient status, including review of consultants reports, relevant laboratory and other test results, and medications completed;       Advised patient to discuss changes in DM, questions, and concerns with provider;      Screening for signs and symptoms of depression related to chronic disease state;        Assessed social determinant of health barriers;    Eye exam scheduled for August 2025      Symptom Management: Take medications as prescribed   Attend all scheduled provider appointments Call provider office for new concerns or questions  call the Suicide and Crisis Lifeline: 988 call the [place] National Suicide Prevention Lifeline: 4793030383 or TTY: 720-554-2843 TTY 931-314-4349) to talk to a trained counselor call 1-800-273-TALK (toll free, 24 hour hotline) if experiencing a Mental Health or Behavioral Health Crisis  keep appointment with eye doctor check feet daily for cuts, sores or redness trim toenails straight across manage portion size wash and dry feet carefully every day wear comfortable, cotton socks wear comfortable, well-fitting shoes  Follow Up Plan: Telephone follow up appointment with care management team member scheduled for: 11-11-2023 at 145 pm       RNCM Care Management Expected Outcome:  Monitor, Self-Manage and Reduce Symptoms of: Depression, Anxiety, Stress       Current Barriers:  Knowledge Deficits related to resources available to help with expressed needs at this time due to her husband losing his job and his unemployment has expired Care Coordination needs related to resources to help meet financial needs at this  time in a patient with stress, anxiety, and depression  Chronic Disease Management support and education needs related to effective management of stress, anxiety, and depression Financial Constraints.   Planned Interventions: Evaluation of current treatment plan related to depression, stress,  and anxiety and patient's adherence to plan as established by provider. Patient reports that she has been under a lot of stress recently and reports that her provider increased some of her medications with effectiveness.  Reviewed medications with patient and discussed compliance. The patient is compliant with medications.  Provided patient with care guide and SW support as well as support from the interdisciplinary team for management of stress, anxiety, and depression  educational materials related to effective management of stress, anxiety, and depression. Continues to decline LCSW referral at this time. Reviewed scheduled/upcoming provider appointments including  Care Guide referral for community resources for [facility] for food insecurities and assistance with utilities. Has information from the care guides  Social Work referral for education and support and resources for effective management of stress, anxiety, and depression.  Discussed plans with patient for ongoing care management follow up and provided patient with direct contact information for care management team Advised patient to discuss changes in mood, increased anxiety, depression, stress, or other mental health needs with provider Screening for signs and symptoms of depression related to chronic disease state  Assessed social determinant of health barriers  Symptom Management: Take medications as prescribed   Attend all scheduled provider appointments Call provider office for new concerns or questions  call the Suicide and Crisis Lifeline: 988 call the [place] National Suicide Prevention Lifeline: 857-567-5100 or TTY: 475 554 5930 TTY (605)798-1930) to talk to a trained counselor call 1-800-273-TALK (toll free, 24 hour hotline) if experiencing a Mental Health or Behavioral Health Crisis   Follow Up Plan: Telephone follow up appointment with care management team member scheduled for: 11-11-2023 at 145 pm       RNCM Care  Management Expected Outcome:  Monitor, Self-Manage, and Reduce Symptoms of Hypertension       Current Barriers:  Chronic Disease Management support and education needs related to effective management of HTN BP Readings from Last 3 Encounters:  10/04/23 120/62  09/16/23 126/82  08/29/23 (!) 144/49   Wt Readings from Last 3 Encounters:  10/04/23 221 lb (100.2 kg)  09/16/23 225 lb (102.1 kg)  08/28/23 218 lb 4.1 oz (99 kg)    Planned Interventions: Evaluation of current treatment plan related to hypertension self management and patient's adherence to plan as established by provider. Regularly checks blood pressure and reports that they are stable. This morning BP 120s/60s. Denies any chest pain, dizziness. She does endorse some mild swelling that she states is normal. She does wear compression stockings and some headaches Provided education to patient re: stroke prevention, s/s of heart attack and stroke. Education and support provided Reviewed prescribed diet heart healthy/ADA diet.  Reviewed medications with patient and discussed importance of compliance. Reports compliance with all medications. Discussed plans with patient for ongoing care management follow up and provided patient with direct contact information for care management team; Advised patient, providing education and rationale, to monitor blood pressure daily and record, calling PCP for findings outside established parameters.  Reviewed scheduled/upcoming provider appointments including: 10-15-23 with Cardiologist Advised patient to discuss changes in her blood pressures or heart health  with provider; Provided education on prescribed diet heart healthy/ADA diet.  Discussed complications of poorly controlled blood pressure such as heart disease, stroke, circulatory complications, vision  complications, kidney impairment, sexual dysfunction;  Screening for signs and symptoms of depression related to chronic disease state;  Assessed  social determinant of health barriers;   Symptom Management: Take medications as prescribed   Attend all scheduled provider appointments Call provider office for new concerns or questions  call the Suicide and Crisis Lifeline: 988 call the [place] National Suicide Prevention Lifeline: 206-800-5701 or TTY: 586-533-1840 TTY 743-164-2608) to talk to a trained counselor call 1-800-273-TALK (toll free, 24 hour hotline) if experiencing a Mental Health or Behavioral Health Crisis  check blood pressure 3 times per week write blood pressure results in a log or diary learn about high blood pressure keep a blood pressure log take blood pressure log to all doctor appointments call doctor for signs and symptoms of high blood pressure develop an action plan for high blood pressure keep all doctor appointments take medications for blood pressure exactly as prescribed report new symptoms to your doctor  Follow Up Plan: Telephone follow up appointment with care management team member scheduled for: 11-11-2023 at 145 pm       RNCM Care Management Expected Outcomes: Monitor, Self-Manage and Reduce Symptoms of: CHF       Current Barriers:  Chronic Disease Management support and education needs related to CHF   RNCM Clinical Goal(s):  Patient will verbalize basic understanding of  CHF disease process and self health management plan as evidenced by verbal explanation, recognizing symptoms, lifestyle modifications take all medications exactly as prescribed and will call provider for medication related questions as evidenced by compliance with all medications attend all scheduled medical appointments: with primary care provider and specialist as evidenced by keeping all scheduled appointments demonstrate Improved and Ongoing adherence to prescribed treatment plan for CHF as evidenced by consistent medication compliance, symptom monitoring, lifestyle modifications, daily weights continue to work with RN Care  Manager to address care management and care coordination needs related to  CHF as evidenced by adherence to CM Team Scheduled appointments through collaboration with RN Care manager, provider, and care team.   Interventions: Evaluation of current treatment plan related to  self management and patient's adherence to plan as established by provider   Heart Failure Interventions:  (Status:  Goal on track:  Yes.) Long Term Goal Basic overview and discussion of pathophysiology of Heart Failure reviewed. Patient knowledgeable about disease process and when to seek help. Reports mild swelling that she states in normal and she does wear compression stockings, shortness of breath at this time. Weighs daily Provided education on low sodium diet. Reports compliance Reviewed Heart Failure Action Plan in depth and provided written copy Assessed need for readable accurate scales in home Provided education about placing scale on hard, flat surface Advised patient to weigh each morning after emptying bladder Discussed importance of daily weight and advised patient to weigh and record daily. Weight 225 lbs. Average weight 222-223 lbs  Reviewed role of diuretics in prevention of fluid overload and management of heart failure; Discussed the importance of keeping all appointments with provider Provided patient with education about the role of exercise in the management of heart failure Advised patient to discuss any new changes with provider Screening for signs and symptoms of depression related to chronic disease state  Assessed social determinant of health barriers   Patient Goals/Self-Care Activities: Take all medications as prescribed Attend all scheduled provider appointments Call pharmacy for medication refills 3-7 days in advance of running out of medications Perform all self care activities independently  Call provider  office for new concerns or questions  call office if I gain more than 2 pounds in  one day or 5 pounds in one week keep legs up while sitting watch for swelling in feet, ankles and legs every day eat more whole grains, fruits and vegetables, lean meats and healthy fats  Follow Up Plan:  Telephone follow up appointment with care management team member scheduled for:  11-11-2023 at 1:45 pm           Our next appointment is by telephone on 11-11-2023 at 1:45 pm  Please call the care guide team at [phone] if you need to cancel or reschedule your appointment.   If you are experiencing a Mental Health or Behavioral Health Crisis or need someone to talk to, please call the Suicide and Crisis Lifeline: 988 call the [place] National Suicide Prevention Lifeline: 574-702-3522 or TTY: 479-036-5622 TTY 581-194-8811) to talk to a trained counselor call 1-800-273-TALK (toll free, 24 hour hotline)   Patient verbalizes understanding of instructions and care plan provided today and agrees to view in MyChart. Active MyChart status and patient understanding of how to access instructions and care plan via MyChart confirmed with patient.     Telephone follow up appointment with care management team member scheduled for:11-11-2023 at 1:45 pm  Larey Brick, BSN RN [facility], [facility] RN Care Manager Direct Dial: [phone]  Fax: [phone]

## 2023-10-14 DIAGNOSIS — G4733 Obstructive sleep apnea (adult) (pediatric): Secondary | ICD-10-CM | POA: Diagnosis not present

## 2023-10-15 ENCOUNTER — Encounter (HOSPITAL_COMMUNITY): Payer: Self-pay | Admitting: Internal Medicine

## 2023-10-15 ENCOUNTER — Ambulatory Visit (HOSPITAL_COMMUNITY)
Admission: RE | Admit: 2023-10-15 | Discharge: 2023-10-15 | Disposition: A | Discharge status: Home / Self Care | Source: Ambulatory Visit | Attending: Internal Medicine | Admitting: Internal Medicine

## 2023-10-15 VITALS — BP 122/68 | HR 69 | Ht 65.0 in | Wt 222.8 lb

## 2023-10-15 DIAGNOSIS — N1832 Chronic kidney disease, stage 3b: Secondary | ICD-10-CM | POA: Diagnosis not present

## 2023-10-15 DIAGNOSIS — Z794 Long term (current) use of insulin: Secondary | ICD-10-CM | POA: Insufficient documentation

## 2023-10-15 DIAGNOSIS — N184 Chronic kidney disease, stage 4 (severe): Secondary | ICD-10-CM

## 2023-10-15 DIAGNOSIS — I480 Paroxysmal atrial fibrillation: Secondary | ICD-10-CM | POA: Insufficient documentation

## 2023-10-15 DIAGNOSIS — I130 Hypertensive heart and chronic kidney disease with heart failure and stage 1 through stage 4 chronic kidney disease, or unspecified chronic kidney disease: Secondary | ICD-10-CM | POA: Diagnosis not present

## 2023-10-15 DIAGNOSIS — I340 Nonrheumatic mitral (valve) insufficiency: Secondary | ICD-10-CM | POA: Insufficient documentation

## 2023-10-15 DIAGNOSIS — Z8674 Personal history of sudden cardiac arrest: Secondary | ICD-10-CM | POA: Insufficient documentation

## 2023-10-15 DIAGNOSIS — E1022 Type 1 diabetes mellitus with diabetic chronic kidney disease: Secondary | ICD-10-CM | POA: Diagnosis not present

## 2023-10-15 DIAGNOSIS — Z7902 Long term (current) use of antithrombotics/antiplatelets: Secondary | ICD-10-CM | POA: Insufficient documentation

## 2023-10-15 DIAGNOSIS — Z9889 Other specified postprocedural states: Secondary | ICD-10-CM | POA: Diagnosis not present

## 2023-10-15 DIAGNOSIS — Z87440 Personal history of urinary (tract) infections: Secondary | ICD-10-CM | POA: Insufficient documentation

## 2023-10-15 DIAGNOSIS — Z955 Presence of coronary angioplasty implant and graft: Secondary | ICD-10-CM | POA: Diagnosis not present

## 2023-10-15 DIAGNOSIS — Z89422 Acquired absence of other left toe(s): Secondary | ICD-10-CM | POA: Insufficient documentation

## 2023-10-15 DIAGNOSIS — Z87891 Personal history of nicotine dependence: Secondary | ICD-10-CM | POA: Insufficient documentation

## 2023-10-15 DIAGNOSIS — Z89412 Acquired absence of left great toe: Secondary | ICD-10-CM | POA: Diagnosis not present

## 2023-10-15 DIAGNOSIS — I252 Old myocardial infarction: Secondary | ICD-10-CM | POA: Insufficient documentation

## 2023-10-15 DIAGNOSIS — E1040 Type 1 diabetes mellitus with diabetic neuropathy, unspecified: Secondary | ICD-10-CM | POA: Diagnosis not present

## 2023-10-15 DIAGNOSIS — I5032 Chronic diastolic (congestive) heart failure: Secondary | ICD-10-CM | POA: Diagnosis not present

## 2023-10-15 DIAGNOSIS — Z79899 Other long term (current) drug therapy: Secondary | ICD-10-CM | POA: Diagnosis not present

## 2023-10-15 DIAGNOSIS — Z7982 Long term (current) use of aspirin: Secondary | ICD-10-CM | POA: Diagnosis not present

## 2023-10-15 DIAGNOSIS — I2510 Atherosclerotic heart disease of native coronary artery without angina pectoris: Secondary | ICD-10-CM | POA: Diagnosis not present

## 2023-10-15 DIAGNOSIS — G4733 Obstructive sleep apnea (adult) (pediatric): Secondary | ICD-10-CM | POA: Insufficient documentation

## 2023-10-15 DIAGNOSIS — Z95818 Presence of other cardiac implants and grafts: Secondary | ICD-10-CM | POA: Diagnosis not present

## 2023-10-15 NOTE — Patient Instructions (Signed)
 There has been no changes to your medications.  Your physician recommends that you schedule a follow-up appointment in: 4 months.  If you have any questions or concerns before your next appointment please send [name] a message through [place] or call our office at [phone].    TO LEAVE A MESSAGE FOR THE NURSE SELECT OPTION 2, PLEASE LEAVE A MESSAGE INCLUDING: YOUR NAME DATE OF BIRTH CALL BACK NUMBER REASON FOR CALL**this is important as we prioritize the call backs  YOU WILL RECEIVE A CALL BACK THE SAME DAY AS LONG AS YOU CALL BEFORE 4:00 PM  At the [facility], you and your health needs are our priority. As part of our continuing mission to provide you with exceptional heart care, we have created designated Provider Care Teams. These Care Teams include your primary Cardiologist (physician) and Advanced Practice Providers (APPs- Physician Assistants and Nurse Practitioners) who all work together to provide you with the care you need, when you need it.   You may see any of the following providers on your designated Care Team at your next follow up: Dr Arvilla Meres Dr Marca Ancona Dr. Dorthula Nettles Dr. Clearnce Hasten Amy Filbert Schilder, NP Robbie Lis, [place] [facility] [place], [place] Brynda Peon, NP [place] Lee, NP Clarisa Kindred, NP Karle Plumber, PharmD Enos Fling, PharmD   Please be sure to bring in all your medications bottles to every appointment.    Thank you for choosing [facility]

## 2023-10-15 NOTE — Progress Notes (Signed)
 ADVANCED HF CLINIC NOTE   PCP: Blane Ohara, MD HF Cardiologist: Arvilla Meres, MD   Chief Complaint: Heart Failure Follow-up  HPI: Dana [name] is 63 y.o. female with history of diastolic heart failure s/p cardiomems 02/2018, CAD, s/p DES RCA and LAD 2018, DMI, HTN, HLD, PAD, and OSA on CPAP, PAF s/p ablation, PAD s/p L great toe and 4th toe amputation, stage IIIb CKD, PEA arrest in 12/2020, severe MR s/p mTEER in 2022.   LHC 3/22 showed moderate mid-LAD and mid-RCA disease, severe mid-L-Cx stenosis s/p PCI/DES mid Cx and side branch angioplasty of OM1.  10/22 she was enrolled in FASTR trial and diuresed > 15 lb. RHC 10/22 showed mild to moderate elevating filling pressures with large v-waves in PCWP tracing suggesting severe MR vs severe diastolic dysfunction. TEE 55-60% showed severe central MR she underwent TEER of mitral valve with placement of 1XT W and 1NT W clip on 05/2021. NT W clip was entangled and had to be deployed into lateral part of valve. No additional clip placement could be preformed. Echo 11/04 with EF 50-55% and residual moderate MR.   Multiple admissions over the last year related to ACS/CP/NSTEMI requiring LHC and PCI (see cardiac studies below). 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 Has narrowly missed HD during multiple admissions:   Today she returns for HF follow up with her friend. Doing well. She had a grandson 3 weeks ago. No recent CP. Doing well with with fluid control. Cardiomems 10-14. Seeing Dr. Kathrene Bongo for Renal. Saw her last week SCr back down to 2.2. Doing ADls. Taking care of her elderly mom as well as grandson and animals. No edema, orthopnea or PND. Taking torsemide 60 daily with extra as needed. Also takes Furoscix for breakthrough.    Cardiac Studies: - Echo 12/24: EF 55-60%, RV normal, MVg unchanged at 5 mmHg - LHC (1/25): PTCA to prox LAD - LHC (12/24): multivessel CAD. Proximal LAD restenosis - LHC (8/24):  PTCA/DES x 1  proximal LAD, chronic occlusion mid Circ beyond the stented segment, patent obtuse marginal branch with severe distal stenosis (too small for PCI), dominant RCA with moderate proximal stenosis, patent distal stent, LVEDP 19 mmhg. - Echo (5/24): EF 50-55%. RV mildly reduced, post mitral clip x 2 with XTW and NTW the latter entangled in chords, no obvious SLD noted, mean gradient 5 mmHg, mild residual MR appears medial to the two clips. - LHC (5/24): DES to LAD, osial prox RCA with severe 85% stenosis-->will need staged intervention when renal function stabilized. - LHC (2/24): 2v obstructive CAD. New 90% stenosis pLAD with RFR 0.48. The stent in the LCx is occluded after a large first OM. Patent stent in the distal RCA. S/p PCI of pLAD with DES x 1 overlapping with prior stent. - Echo (1/24): EF 55-60%, normal RV and moderate MR, mG 7.5 mmHg, essentially unchanged from prior echo.   Review of systems complete and found to be negative unless listed in HPI.    Past Medical History:  Diagnosis Date   Acquired absence of left great toe (HCC)    Acquired absence of other left toe(s) (HCC)    ACS (acute coronary syndrome) (HCC)    Anemia    Anoxic brain injury (HCC)    Anxiety    CAD (coronary artery disease)    DES mLAD 04/07/15; DES dRCA & DES pPDA 08/30/16; DES mCX & PTCA OM1 09/29/20   Chronic diastolic (congestive) heart failure (HCC)  Chronic pain    Chronic systolic (congestive) heart failure (HCC)    CKD (chronic kidney disease)    Contrast dye induced nephropathy, possible 09/03/2020   COPD (chronic obstructive pulmonary disease) (HCC)    COVID-19 virus infection 03/03/2023   Diabetes (HCC)type 1    Diastolic CHF (HCC)    Dyspnea    Family history of breast cancer 10/23/2021   Gastro-esophageal reflux disease without esophagitis    Hyperlipidemia    Hypertension    Hypertensive heart disease with heart failure (HCC)    Hypoxemia    Idiopathic gout, multiple sites    Leukocytosis  03/29/2022   Localized edema    Low back pain    Mitral regurgitation    Mitral regurgitation    Mixed hyperlipidemia    Mixed incontinence    Morbid (severe) obesity due to excess calories (HCC)    Neuromuscular disorder (HCC)    Neuropathy    Non-ST elevation (NSTEMI) myocardial infarction (HCC)    08/29/20, 09/29/20   OSA (obstructive sleep apnea) 09/03/2020   Other specified hypothyroidism    PAF (paroxysmal atrial fibrillation) (HCC)    s/p pulmonary vein isolation by cryoablation 10/04/15 Dr. Rudolpho Sevin in [facility]   PEA (Pulseless electrical activity) [facility])    ~ 01/13/21 at [facility] during admission DKA, volume overload, possible CAP, hypoxia s/p ACLS with ROSC ~ 10-15   Pneumonia    Primary insomnia    Rheumatoid arthritis (HCC)    Stroke (HCC)    Thyroid disease    Type 1 diabetes mellitus (HCC)    Vitamin D deficiency    Current Outpatient Medications  Medication Sig Dispense Refill   acetaminophen (TYLENOL) 500 MG tablet Take 1,000 mg by mouth every 6 (six) hours as needed for mild pain (pain score 1-3) or headache.     allopurinol (ZYLOPRIM) 100 MG tablet Take 0.5 tablets (50 mg total) by mouth daily. 90 tablet 1   aspirin EC 81 MG tablet Take 1 tablet (81 mg total) by mouth daily. Swallow whole. 30 tablet 12   busPIRone (BUSPAR) 15 MG tablet Take 1 tablet (15 mg total) by mouth 3 (three) times daily. 90 tablet 0   carvedilol (COREG) 6.25 MG tablet Take 1.5 tablets (9.375 mg total) by mouth 2 (two) times daily. 90 tablet 3   Continuous Glucose Sensor (FREESTYLE LIBRE 2 SENSOR) MISC Inject 1 device into skin every 14 (fourteen) days. 2 each 2   doxepin (SINEQUAN) 150 MG capsule Take 1 capsule (150 mg total) by mouth at bedtime. 30 capsule 0   EPINEPHrine 0.3 mg/0.3 mL IJ SOAJ injection Use as directed for life-threatening allergic reaction. 4 each 3   Evolocumab (REPATHA SURECLICK) 140 MG/ML SOAJ Inject 140 mg into the skin every 14 (fourteen) days. 6 mL 1   ezetimibe  (ZETIA) 10 MG tablet Take 1 tablet (10 mg total) by mouth every evening. 90 tablet 1   gabapentin (NEURONTIN) 100 MG capsule Take 1 capsule (100 mg total) by mouth 2 (two) times daily. 180 capsule 3   Glucagon (GVOKE HYPOPEN 2-PACK) 0.5 MG/0.1ML SOAJ Inject 0.5 mg into the skin daily as needed. 0.2 mL 5   HYDROcodone-acetaminophen (NORCO) 7.5-325 MG tablet Take 1 tablet by mouth 3 (three) times daily as needed. (Patient taking differently: Take 1 tablet by mouth 3 (three) times daily.) 90 tablet 0   insulin aspart (NOVOLOG FLEXPEN) 100 UNIT/ML FlexPen Inject 2 - 10 Units before meals based on SSI. 15 mL 3  insulin glargine (LANTUS SOLOSTAR) 100 UNIT/ML Solostar Pen Inject 33-35 Units into the skin daily.     Insulin Pen Needle (BD PEN NEEDLE NANO 2ND GEN) 32G X 4 MM MISC Inject 1 each into the skin in the morning, at noon, in the evening, and at bedtime. 600 each 3   isosorbide mononitrate (IMDUR) 30 MG 24 hr tablet Take 1 tablet (30 mg total) by mouth daily. 30 tablet 3   latanoprost (XALATAN) 0.005 % ophthalmic solution Place 1 drop into both eyes at bedtime. 2.5 mL 0   linaclotide (LINZESS) 145 MCG CAPS capsule Take 1 capsule (145 mcg total) by mouth daily before breakfast. 90 capsule 1   LORazepam (ATIVAN) 0.5 MG tablet Take 1 tablet (0.5 mg total) by mouth daily as needed for anxiety. 90 tablet 1   magnesium oxide (MAG-OX) 400 MG tablet Take 2 tablets (800 mg total) by mouth daily. Take 2 (400 mg) daily=800 mg 180 tablet 1   Menthol-Methyl Salicylate (ICY HOT EXTRA STRENGTH) 10-30 % CREA Apply 1 Application topically as needed (bilateral hips and knees).     nitroGLYCERIN (NITROSTAT) 0.4 MG SL tablet DISSOLVE ONE TABLET UNDER THE TONGUE EVERY 5 MINUTES AS NEEDED FOR CHEST PAIN.  DO NOT EXCEED A TOTAL OF 3 DOSES IN 15 MINUTES 50 tablet 3   Omega-3 Fatty Acids (FISH OIL PO) Take 1 capsule by mouth daily.     OXYGEN Inhale 2 L/min into the lungs at bedtime.     pantoprazole (PROTONIX) 40 MG tablet  Take 1 tablet (40 mg total) by mouth daily. 90 tablet 1   potassium chloride SA (KLOR-CON M) 20 MEQ tablet Take 1 tablet (20 mEq total) by mouth daily.     Probiotic CAPS Take 1 capsule by mouth daily.     promethazine (PHENERGAN) 25 MG tablet Take 1 tablet (25 mg total) by mouth daily as needed. 90 tablet 1   ticagrelor (BRILINTA) 90 MG TABS tablet Take 1 tablet (90 mg total) by mouth 2 (two) times daily. 180 tablet 1   torsemide (DEMADEX) 20 MG tablet 3 tablets (60 mg total) daily.     No current facility-administered medications for this encounter.   Allergies  Allergen Reactions   Naproxen Hives and Rash   Shellfish-Derived Products Hives, Swelling and Other (See Comments)    Angioedema    Strawberry (Diagnostic) Hives   Celecoxib Other (See Comments)    Stomach pain   Glucosamine Hives, Swelling and Other (See Comments)    Angioedema    Iron Hives and Other (See Comments)    IV iron transfusion - hives - Feb 2022 hospitalization  Patient said she CAN tolerate if given Benadryl first   Metoclopramide Other (See Comments)    Facial twitching and stuttering   Metolazone Other (See Comments)    Acute renal failure   Statins Other (See Comments)    Muscle pain   Social History   Socioeconomic History   Marital status: Married    Spouse name: [name]   Number of children: 2   Years of education: 16   Highest education level: Master's degree (e.g., MA, Dana, MEng, MEd, MSW, MBA)  Occupational History   Occupation: on disability/retired early former Engineer, site  Tobacco Use   Smoking status: Former    Current packs/day: 0.00    Types: Cigarettes    Start date: 28    Quit date: 1984    Years since quitting: 41.2    Passive exposure: Past  Smokeless tobacco: Never  Vaping Use   Vaping status: Never Used  Substance and Sexual Activity   Alcohol use: Never   Drug use: Never   Sexual activity: Not on file  Other Topics Concern   Not on file  Social History Narrative    Not on file   Social Drivers of Health   Financial Resource Strain: Low Risk  (07/01/2023)   Overall Financial Resource Strain (CARDIA)    Difficulty of Paying Living Expenses: Not hard at all  Food Insecurity: No Food Insecurity (08/30/2023)   Hunger Vital Sign    Worried About Running Out of Food in the Last Year: Never true    Ran Out of Food in the Last Year: Never true  Transportation Needs: No Transportation Needs (08/30/2023)   PRAPARE - Administrator, Civil Service (Medical): No    Lack of Transportation (Non-Medical): No  Physical Activity: Insufficiently Active (07/01/2023)   Exercise Vital Sign    Days of Exercise per Week: 5 days    Minutes of Exercise per Session: 20 min  Stress: Stress Concern Present (07/01/2023)   [employer] of Occupational Health - Occupational Stress Questionnaire    Feeling of Stress : To some extent  Social Connections: Moderately Integrated (07/25/2022)   Social Connection and Isolation Panel [NHANES]    Frequency of Communication with Friends and Family: More than three times a week    Frequency of Social Gatherings with Friends and Family: More than three times a week    Attends Religious Services: More than 4 times per year    Active Member of [employer] or Organizations: No    Attends Banker Meetings: Never    Marital Status: Married  Catering manager Violence: Not At Risk (08/30/2023)   Humiliation, Afraid, Rape, and Kick questionnaire    Fear of Current or Ex-Partner: No    Emotionally Abused: No    Physically Abused: No    Sexually Abused: No   Family History  Problem Relation Age of Onset   Breast cancer Mother 71       contralateral breast ca dx 76   Coronary artery disease Mother    Hypertension Mother    Hyperlipidemia Mother    Diabetes type II Mother    Lung cancer Mother 11   Diabetes Father    Hypertension Father    Mental illness Sister    Bipolar disorder Other    Depression Other     Schizophrenia Other    Cancer Other        MGF's sisters x2; unknown type   BP 122/68   Pulse 69   Ht 5\' 5"  (1.651 m)   Wt 101.1 kg (222 lb 12.8 oz)   SpO2 96%   BMI 37.08 kg/m   Wt Readings from Last 3 Encounters:  10/15/23 101.1 kg (222 lb 12.8 oz)  10/04/23 100.2 kg (221 lb)  09/16/23 102.1 kg (225 lb)   PHYSICAL EXAM: General:  Well appearing. No resp difficulty HEENT: normal Neck: supple. no JVD. Carotids 2+ bilat; no bruits. No lymphadenopathy or thryomegaly appreciated. Cor: PMI nondisplaced. Regular rate & rhythm. No rubs, gallops or murmurs. Lungs: clear Abdomen: obese soft, nontender, nondistended. No hepatosplenomegaly. No bruits or masses. Good bowel sounds. Extremities: no cyanosis, clubbing, rash, edema + TED Neuro: alert & orientedx3, cranial nerves grossly intact. moves all 4 extremities w/o difficulty. Affect pleasant   CardioMEMs: 10 08/02/23. Goal 10 Range 10-14 Personally reviewed   EKG  SB 57 bpm (Personally reviewed)    ASSESSMENT & PLAN:  1. CAD, H/o NSTEMIs - H/o multiple PCI with stents to mid LAD and pRCA - NSTEMI (2/22). Cath deferred due to AKI. Treated medically.  - NSTEMI (7/22)-->S/p PCI/DES to mid Cx, balloon angioplasty to OM1  - LHC (2/24) for chest pain--> new 90% stenosis pLAD. Successful PCI of pLAD with DES x 1 overlapping with prior stent.  - LHC (5/24) --> DES to LAD, severe 85% ostial prox RCA will need eventual intervention but renal function precludes.  - LHC (8/24): PTCA/DES x 1 proximal LAD - LHC (12/24): with proximal LAD restenosis. S/p successful balloon angioplasty 07/18/23 - Doing well. No s/s angina  - Continue ASA + Brilinta. - Continue Coreg. - Continue Repatha + Zetia (allergy statins). Recent LDL 28 12/24 - Continue Imdur  2. Chronic Diastolic Heart Failure.  - Echo (3/22): EF 55-60%, Grade II DD, normal RV - RHC (10/22): mild to moderate elevated filling pressures, large v-waves PCWP tracing - TEE (10/22): EF  55-60%, basal inferior akinesis, severe central MR.>>Underwent TEER>>mod residual MR - Echo (11/22): EF 50-55%, severe LV HK, moderate MR - Echo (12/24): EF 55-60%, LV with no RWMA, RV normal, mild MR mG 5 mmHg - Stable/improved NYHA III. Volume ok today on exam and cardiomems - Continue torsemide 60 mg daily + 20 KCL daily. - No SGLT2i with DM1 and recurrent UTIs - Recent labs reviewed Scr 2.21   3. CKD IV - Prior h/o CIN following LHC - Previous SCr baseline 1.6-1.8 with multiple AKIs over the last year. New baseline ~ 2.0-2.5 - Followed by Dr. Kathrene Bongo. Last SCr 2.2 - AKI post cath (5/24), 60 cc contrast used. SCr 2.7>4.25 - AKI post cath (8/24), 80 cc contrast used. SCr peak at 5.19  4. MR  - Severe central MR on TEE on 10/22.   - S/p TEER 11/22 - two clips placed, one became entangled and had to be deployed. - Moderate residual MR on echo 11/22 - Echo (5/24): post mitral clip x 2 with XTW and NTW the latter entangled in chords no obvious SLD noted mean gradient 5 mmHg. Mild residual MR appears medial to the two clips  - Echo 12/24: Stable moderate MR   5. OSA - Continue CPAP - No change   6. DM I - On insulin - Recent A1c 9 10/24 - No SGLT2i with DM1 and recurrent UTIs     Arvilla Meres, MD  10:34 AM

## 2023-10-16 ENCOUNTER — Other Ambulatory Visit (HOSPITAL_COMMUNITY): Payer: Self-pay

## 2023-10-16 ENCOUNTER — Encounter

## 2023-10-16 ENCOUNTER — Other Ambulatory Visit: Payer: Self-pay | Admitting: Family Medicine

## 2023-10-16 ENCOUNTER — Other Ambulatory Visit: Payer: Self-pay

## 2023-10-16 DIAGNOSIS — F411 Generalized anxiety disorder: Secondary | ICD-10-CM | POA: Diagnosis not present

## 2023-10-16 MED ORDER — HYDROCODONE-ACETAMINOPHEN 7.5-325 MG PO TABS
1.0000 | ORAL_TABLET | Freq: Three times a day (TID) | ORAL | 0 refills | Status: DC | PRN
  Filled 2023-10-16: qty 90, 30d supply, fill #0

## 2023-10-16 MED ORDER — ICY HOT EXTRA STRENGTH 10-30 % EX CREA
1.0000 | TOPICAL_CREAM | CUTANEOUS | 1 refills | Status: AC | PRN
  Filled 2023-10-16: qty 85, fill #0

## 2023-10-17 ENCOUNTER — Other Ambulatory Visit (HOSPITAL_COMMUNITY): Payer: Self-pay

## 2023-10-17 ENCOUNTER — Encounter (HOSPITAL_COMMUNITY): Payer: Self-pay | Admitting: Internal Medicine

## 2023-10-17 ENCOUNTER — Other Ambulatory Visit: Payer: Self-pay

## 2023-10-17 MED ORDER — TORSEMIDE 20 MG PO TABS
60.0000 mg | ORAL_TABLET | Freq: Every day | ORAL | 3 refills | Status: DC
  Filled 2023-10-17: qty 90, 30d supply, fill #0
  Filled 2023-11-14: qty 90, 30d supply, fill #1
  Filled 2023-12-11: qty 90, 30d supply, fill #2

## 2023-10-18 ENCOUNTER — Other Ambulatory Visit: Payer: Self-pay

## 2023-10-22 ENCOUNTER — Encounter (HOSPITAL_BASED_OUTPATIENT_CLINIC_OR_DEPARTMENT_OTHER): Payer: Self-pay

## 2023-10-22 ENCOUNTER — Other Ambulatory Visit (HOSPITAL_BASED_OUTPATIENT_CLINIC_OR_DEPARTMENT_OTHER): Payer: Self-pay

## 2023-10-22 ENCOUNTER — Encounter

## 2023-10-22 ENCOUNTER — Ambulatory Visit (HOSPITAL_BASED_OUTPATIENT_CLINIC_OR_DEPARTMENT_OTHER)
Admission: EM | Admit: 2023-10-22 | Discharge: 2023-10-22 | Disposition: A | Discharge status: Home / Self Care | Attending: Family Medicine | Admitting: Family Medicine

## 2023-10-22 DIAGNOSIS — M791 Myalgia, unspecified site: Secondary | ICD-10-CM

## 2023-10-22 LAB — POCT URINALYSIS DIP (MANUAL ENTRY)
Bilirubin, UA: NEGATIVE
Glucose, UA: NEGATIVE mg/dL
Ketones, POC UA: NEGATIVE mg/dL
Leukocytes, UA: NEGATIVE
Nitrite, UA: NEGATIVE
Protein Ur, POC: 100 mg/dL — AB
Spec Grav, UA: 1.015 (ref 1.010–1.025)
Urobilinogen, UA: 0.2 U/dL
pH, UA: 5.5 (ref 5.0–8.0)

## 2023-10-22 MED ORDER — TIZANIDINE HCL 2 MG PO TABS
2.0000 mg | ORAL_TABLET | Freq: Three times a day (TID) | ORAL | 0 refills | Status: DC
  Filled 2023-10-22: qty 30, 10d supply, fill #0

## 2023-10-22 NOTE — Discharge Instructions (Addendum)
 Your urine did not show any signs of infection.  I believe this is muscular in nature.  I am given you low-dose muscle relaxer to try to see if this will help.  Recommend heat to the area.  Gentle stretching.  Also make sure you are staying hydrated this could be due to electrolyte deficiency like low magnesium or potassium.  You could do an electrolyte drink packet to see if this helps. Follow-up with your doctor for any continued issues

## 2023-10-22 NOTE — ED Triage Notes (Signed)
 States left side pain onset 3 days ago. Now having pain to right side as well. States it feels similar to when she had CPR performed on her and they broke several ribs. Pain does not radiate. Denies urinary frequency or changes in voiding habits. States very hard to find a position of comfort. Denies injuries or falls recently. Took a hydrocodone last night.

## 2023-10-22 NOTE — ED Provider Notes (Signed)
 [facility]    CSN: [id number] Arrival date & time: 10/22/23  0809      History   Chief Complaint Chief Complaint  Patient presents with   Flank Pain    HPI MARIJO QUIZON is a 63 y.o. female.   Patient is a 63 year old female who presents today with bilateral side pain.  Significant past medical history to include chronic lower back pain.  Reports approximate 3-day ago started having left side pain and now is radiating to the right side.  The pain is worse with certain movements.  The pain is worse with touching.  There has been no rashes.  Denies any specific urinary symptoms.  Does have a history of kidney disease.  No fever, chills, nausea or vomiting.  She has been taking pain medication without much relief of the pain.  She has recently started a new medication called doxepin to help with sleep.  This made her have very dry mouth and she feels dehydrated.  She does take potassium and magnesium due to to for deficiencies of this.  No recent falls or injuries   Flank Pain    Past Medical History:  Diagnosis Date   Acquired absence of left great toe (HCC)    Acquired absence of other left toe(s) (HCC)    ACS (acute coronary syndrome) (HCC)    Anemia    Anoxic brain injury (HCC)    Anxiety    CAD (coronary artery disease)    DES mLAD 04/07/15; DES dRCA & DES pPDA 08/30/16; DES mCX & PTCA OM1 09/29/20   Chronic diastolic (congestive) heart failure (HCC)    Chronic pain    Chronic systolic (congestive) heart failure (HCC)    CKD (chronic kidney disease)    Contrast dye induced nephropathy, possible 09/03/2020   COPD (chronic obstructive pulmonary disease) (HCC)    COVID-19 virus infection 03/03/2023   Diabetes (HCC)type 1    Diastolic CHF (HCC)    Dyspnea    Family history of breast cancer 10/23/2021   Gastro-esophageal reflux disease without esophagitis    Hyperlipidemia    Hypertension    Hypertensive heart disease with heart failure (HCC)    Hypoxemia     Idiopathic gout, multiple sites    Leukocytosis 03/29/2022   Localized edema    Low back pain    Mitral regurgitation    Mitral regurgitation    Mixed hyperlipidemia    Mixed incontinence    Morbid (severe) obesity due to excess calories (HCC)    Neuromuscular disorder (HCC)    Neuropathy    Non-ST elevation (NSTEMI) myocardial infarction (HCC)    08/29/20, 09/29/20   OSA (obstructive sleep apnea) 09/03/2020   Other specified hypothyroidism    PAF (paroxysmal atrial fibrillation) (HCC)    s/p pulmonary vein isolation by cryoablation 10/04/15 Dr. Rudolpho Sevin in [facility]   PEA (Pulseless electrical activity) [facility])    ~ 01/13/21 at [facility] during admission DKA, volume overload, possible CAP, hypoxia s/p ACLS with ROSC ~ 10-15   Pneumonia    Primary insomnia    Rheumatoid arthritis (HCC)    Stroke (HCC)    Thyroid disease    Type 1 diabetes mellitus (HCC)    Vitamin D deficiency     Patient Active Problem List   Diagnosis Date Noted   Situational insomnia 10/04/2023   Moderately severe major depression (HCC) 10/04/2023   Acute on chronic combined systolic and diastolic CHF (congestive heart failure) (HCC) 09/16/2023  Encounter for screening mammogram for malignant neoplasm of breast 09/16/2023   Elevated troponin 08/28/2023   Abnormal echocardiogram 08/28/2023   Coronary artery disease involving native coronary artery of native heart without angina pectoris 08/28/2023   SOB (shortness of breath) 08/27/2023   URI with cough and congestion 08/22/2023   Hospital discharge follow-up 07/29/2023   Coronary artery disease involving native coronary artery of native heart with angina pectoris (HCC) 07/20/2023   Acute on chronic heart failure with preserved ejection fraction (HCC) 07/20/2023   Acute respiratory failure with hypoxia (HCC) 07/20/2023   Angina pectoris (HCC) 07/13/2023   Hyperglycemia 07/13/2023   Presence of heart assist device (HCC) 07/13/2023   Moderate recurrent  major depression (HCC) 07/13/2023   Acute pain of right knee 07/13/2023   History of amputation of hallux (HCC) 05/24/2023   Bilateral hand pain 05/14/2023   Encounter for immunization 04/04/2023   Acute bronchitis due to COVID-19 virus 03/03/2023   Contrast-induced nephropathy 02/20/2023   Unstable angina (HCC) 09/07/2022   Chronic idiopathic constipation 09/01/2022   Hyperphosphatemia 01/09/2022   Genetic testing 11/07/2021   Family history of breast cancer 10/23/2021   GAD (generalized anxiety disorder) 07/11/2021   Mixed hyperlipidemia 07/11/2021   S/P mitral valve clip implantation 05/18/2021   History of cardiac arrest 02/06/2021   Impaired mobility and ADLs 01/20/2021   Left-sided weakness 01/20/2021   Drug-induced myopathy 01/03/2021   Secondary hyperparathyroidism of renal origin (HCC) 11/18/2020   Vitamin D insufficiency 11/18/2020   CKD stage 4 due to type 1 diabetes mellitus (HCC) 10/13/2020   NSTEMI (non-ST elevated myocardial infarction) (HCC) 09/03/2020   Contrast dye induced nephropathy, possible 09/03/2020   OSA on CPAP 09/03/2020   Insulin dependent type 2 diabetes mellitus (HCC) 09/03/2020   Need for COVID-19 vaccine 06/28/2020   Chronic respiratory failure with hypoxia (HCC) 03/23/2020   Chronic gout without tophus 03/23/2020   Pain in both hands 03/23/2020   Uncomplicated opioid dependence (HCC) 03/23/2020   Depression, major, recurrent, mild (HCC) 12/21/2019   Hypomagnesemia 12/21/2019   Lumbar back pain 10/05/2019   Hypertensive heart and renal disease with congestive heart failure (HCC) 09/18/2019   Demand ischemia (HCC)    Chronic heart failure with preserved ejection fraction (HFpEF) (HCC) 11/07/2017   Class 2 severe obesity due to excess calories with serious comorbidity and body mass index (BMI) of 39.0 to 39.9 in adult (HCC) 11/07/2017   Hyperlipidemia LDL goal <70 12/14/2016   Anemia in CKD (chronic kidney disease) 03/23/2016   Chronic pain  syndrome 03/23/2016   Coronary artery disease of native artery of native heart with stable angina pectoris (HCC) 03/23/2016   GERD without esophagitis 03/23/2016   On home oxygen therapy 03/23/2016   S/P ablation of atrial fibrillation 10/04/2015   Acute renal failure superimposed on stage 3b chronic kidney disease (HCC) 08/30/2015   Diabetic polyneuropathy associated with type 2 diabetes mellitus (HCC) 04/02/2014    Past Surgical History:  Procedure Laterality Date   ABDOMINAL HYSTERECTOMY     Partial- Still has both ovaries   CARDIOVASCULAR STRESS TEST  08/13/2014   Nuclear; Normal   CARPAL TUNNEL RELEASE     CATARACT EXTRACTION, BILATERAL     CESAREAN SECTION     CHOLECYSTECTOMY     CORONARY ANGIOPLASTY WITH STENT PLACEMENT     3 blockages/ 1 stent   CORONARY BALLOON ANGIOPLASTY N/A 07/18/2023   Procedure: CORONARY BALLOON ANGIOPLASTY;  Surgeon: Kathleene Hazel, MD;  Location: [facility] INVASIVE CV LAB;  Service: Cardiovascular;  Laterality: N/A;   CORONARY PRESSURE/FFR STUDY N/A 09/07/2022   Procedure: INTRAVASCULAR PRESSURE WIRE/FFR STUDY;  Surgeon: Swaziland, Peter M, MD;  Location: [facility] INVASIVE CV LAB;  Service: Cardiovascular;  Laterality: N/A;   CORONARY STENT INTERVENTION N/A 09/29/2020   Procedure: CORONARY STENT INTERVENTION;  Surgeon: Tonny Bollman, MD;  Location: [facility] INVASIVE CV LAB;  Service: Cardiovascular;  Laterality: N/A;  CFX   CORONARY STENT INTERVENTION N/A 09/07/2022   Procedure: CORONARY STENT INTERVENTION;  Surgeon: Swaziland, Peter M, MD;  Location: [facility] INVASIVE CV LAB;  Service: Cardiovascular;  Laterality: N/A;   CORONARY STENT INTERVENTION N/A 02/13/2023   Procedure: CORONARY STENT INTERVENTION;  Surgeon: Kathleene Hazel, MD;  Location: [facility] INVASIVE CV LAB;  Service: Cardiovascular;  Laterality: N/A;  LAD   CORONARY ULTRASOUND/IVUS N/A 02/13/2023   Procedure: Coronary Ultrasound/IVUS;  Surgeon: Kathleene Hazel, MD;  Location: [facility] INVASIVE CV LAB;   Service: Cardiovascular;  Laterality: N/A;   CORONARY/GRAFT ACUTE MI REVASCULARIZATION N/A 12/08/2022   Procedure: Coronary/Graft Acute MI Revascularization;  Surgeon: Marykay Lex, MD;  Location: [facility] INVASIVE CV LAB;  Service: Cardiovascular;  Laterality: N/A;   EYE SURGERY     LEFT HEART CATH AND CORONARY ANGIOGRAPHY N/A 09/29/2020   Procedure: LEFT HEART CATH AND CORONARY ANGIOGRAPHY;  Surgeon: Tonny Bollman, MD;  Location: [facility] INVASIVE CV LAB;  Service: Cardiovascular;  Laterality: N/A;   LEFT HEART CATH AND CORONARY ANGIOGRAPHY N/A 09/07/2022   Procedure: LEFT HEART CATH AND CORONARY ANGIOGRAPHY;  Surgeon: Swaziland, Peter M, MD;  Location: [facility] INVASIVE CV LAB;  Service: Cardiovascular;  Laterality: N/A;   LEFT HEART CATH AND CORONARY ANGIOGRAPHY N/A 12/08/2022   Procedure: LEFT HEART CATH AND CORONARY ANGIOGRAPHY;  Surgeon: Marykay Lex, MD;  Location: [facility] INVASIVE CV LAB;  Service: Cardiovascular;  Laterality: N/A;   LEFT HEART CATH AND CORONARY ANGIOGRAPHY N/A 02/13/2023   Procedure: LEFT HEART CATH AND CORONARY ANGIOGRAPHY;  Surgeon: Kathleene Hazel, MD;  Location: [facility] INV[facility];  Service: Cardiovascular;  Laterality: N/A;   LEFT HEART CATH AND CORONARY ANGIOGRAPHY N/A 07/16/2023   Procedure: LEFT HEART CATH AND CORONARY ANGIOGRAPHY;  Surgeon: Kathleene Hazel, MD;  Location: [facility] INVASIVE CV LAB;  Service: Cardiovascular;  Laterality: N/A;   RIGHT HEART CATH N/A 04/27/2021   Procedure: RIGHT HEART CATH;  Surgeon: Dolores Patty, MD;  Location: [facility] INVASIVE CV LAB;  Service: Cardiovascular;  Laterality: N/A;   RIGHT/LEFT HEART CATH AND CORONARY ANGIOGRAPHY N/A 01/07/2017   Procedure: Right/Left Heart Cath and Coronary Angiography;  Surgeon: Laurey Morale, MD;  Location: [facility] INVASIVE CV LAB;  Service: Cardiovascular;  Laterality: N/A;   ROTATOR CUFF REPAIR     TEE WITHOUT CARDIOVERSION N/A 04/28/2021   Procedure: TRANSESOPHAGEAL ECHOCARDIOGRAM (TEE);  Surgeon:  Dolores Patty, MD;  Location: [facility] ENDOSCOPY;  Service: Cardiovascular;  Laterality: N/A;   TEE WITHOUT CARDIOVERSION N/A 05/18/2021   Procedure: TRANSESOPHAGEAL ECHOCARDIOGRAM (TEE);  Surgeon: Orbie Pyo, MD;  Location: [facility] INVASIVE CV LAB;  Service: Cardiovascular;  Laterality: N/A;   TOE AMPUTATION Left    TRANSCATHETER MITRAL EDGE TO EDGE REPAIR N/A 05/18/2021   Procedure: MITRAL VALVE REPAIR;  Surgeon: Orbie Pyo, MD;  Location: [facility] INVASIVE CV LAB;  Service: Cardiovascular;  Laterality: N/A;   US ECHOCARDIOGRAPHY  05/2017   Normal    OB History   No obstetric history on file.      Home Medications    Prior to Admission medications   Medication  Sig Start Date End Date Taking? Authorizing Provider  tiZANidine (ZANAFLEX) 2 MG tablet Take 1 tablet (2 mg total) by mouth 3 (three) times daily. 10/22/23  Yes Eri Mcevers A, FNP  acetaminophen (TYLENOL) 500 MG tablet Take 1,000 mg by mouth every 6 (six) hours as needed for mild pain (pain score 1-3) or headache.    [provider]  allopurinol (ZYLOPRIM) 100 MG tablet Take 0.5 tablets (50 mg total) by mouth daily. 04/29/23   Blane Ohara, MD  aspirin EC 81 MG tablet Take 1 tablet (81 mg total) by mouth daily. Swallow whole. 08/13/23   Cox, Fritzi Mandes, MD  busPIRone (BUSPAR) 15 MG tablet Take 1 tablet (15 mg total) by mouth 3 (three) times daily. 10/04/23 11/03/23  Renne Crigler, FNP  carvedilol (COREG) 6.25 MG tablet Take 1.5 tablets (9.375 mg total) by mouth 2 (two) times daily. 08/05/23   Alen Bleacher, NP  Continuous Glucose Sensor (FREESTYLE LIBRE 2 SENSOR) MISC Inject 1 device into skin every 14 (fourteen) days. 08/14/23   Cox, Fritzi Mandes, MD  doxepin (SINEQUAN) 150 MG capsule Take 1 capsule (150 mg total) by mouth at bedtime. 10/04/23   Renne Crigler, FNP  EPINEPHrine 0.3 mg/0.3 mL IJ SOAJ injection Use as directed for life-threatening allergic reaction. 04/29/23   Cox, Kirsten, MD  Evolocumab (REPATHA SURECLICK) 140 MG/ML  SOAJ Inject 140 mg into the skin every 14 (fourteen) days. 04/29/23   Cox, Fritzi Mandes, MD  ezetimibe (ZETIA) 10 MG tablet Take 1 tablet (10 mg total) by mouth every evening. 04/29/23   CoxFritzi Mandes, MD  gabapentin (NEURONTIN) 100 MG capsule Take 1 capsule (100 mg total) by mouth 2 (two) times daily. 09/02/23   Cox, Fritzi Mandes, MD  Glucagon (GVOKE HYPOPEN 2-PACK) 0.5 MG/0.1ML SOAJ Inject 0.5 mg into the skin daily as needed. 07/12/23   Cox, Fritzi Mandes, MD  HYDROcodone-acetaminophen (NORCO) 7.5-325 MG tablet Take 1 tablet by mouth 3 (three) times daily as needed. 10/16/23   Marianne Sofia, PA-C  insulin aspart (NOVOLOG FLEXPEN) 100 UNIT/ML FlexPen Inject 2 - 10 Units before meals based on SSI. 04/29/23   Cox, Kirsten, MD  insulin glargine (LANTUS SOLOSTAR) 100 UNIT/ML Solostar Pen Inject 33-35 Units into the skin daily. 08/29/23   Zannie Cove, MD  Insulin Pen Needle (BD PEN NEEDLE NANO 2ND GEN) 32G X 4 MM MISC Inject 1 each into the skin in the morning, at noon, in the evening, and at bedtime. 08/30/21   CoxFritzi Mandes, MD  isosorbide mononitrate (IMDUR) 30 MG 24 hr tablet Take 1 tablet (30 mg total) by mouth daily. 08/05/23   Alen Bleacher, NP  latanoprost (XALATAN) 0.005 % ophthalmic solution Place 1 drop into both eyes at bedtime. 08/14/23   Blane Ohara, MD  linaclotide Karlene Einstein) 145 MCG CAPS capsule Take 1 capsule (145 mcg total) by mouth daily before breakfast. 04/29/23   Cox, Kirsten, MD  LORazepam (ATIVAN) 0.5 MG tablet Take 1 tablet (0.5 mg total) by mouth daily as needed for anxiety. 04/29/23   CoxFritzi Mandes, MD  magnesium oxide (MAG-OX) 400 MG tablet Take 2 tablets (800 mg total) by mouth daily. Take 2 (400 mg) daily=800 mg 04/29/23   Cox, Kirsten, MD  Menthol-Methyl Salicylate (ICY HOT EXTRA STRENGTH) 10-30 % CREA Apply 1 Application topically as needed (bilateral hips and knees). 10/16/23   Marianne Sofia, PA-C  nitroGLYCERIN (NITROSTAT) 0.4 MG SL tablet DISSOLVE ONE TABLET UNDER THE TONGUE EVERY 5 MINUTES AS  NEEDED FOR CHEST PAIN.  DO NOT  EXCEED A TOTAL OF 3 DOSES IN 15 MINUTES 04/29/23   Cox, Kirsten, MD  Omega-3 Fatty Acids (FISH OIL PO) Take 1 capsule by mouth daily.    [provider]  OXYGEN Inhale 2 L/min into the lungs at bedtime.    [provider]  pantoprazole (PROTONIX) 40 MG tablet Take 1 tablet (40 mg total) by mouth daily. 04/29/23   Cox, Fritzi Mandes, MD  potassium chloride SA (KLOR-CON M) 20 MEQ tablet Take 1 tablet (20 mEq total) by mouth daily. 08/30/23   Zannie Cove, MD  Probiotic CAPS Take 1 capsule by mouth daily.    [provider]  promethazine (PHENERGAN) 25 MG tablet Take 1 tablet (25 mg total) by mouth daily as needed. 04/29/23   CoxFritzi Mandes, MD  ticagrelor (BRILINTA) 90 MG TABS tablet Take 1 tablet (90 mg total) by mouth 2 (two) times daily. 04/29/23   Cox, Fritzi Mandes, MD  torsemide (DEMADEX) 20 MG tablet Take 3 tablets (60 mg total) by mouth daily. 10/17/23   Bensimhon, Bevelyn Buckles, MD    Family History Family History  Problem Relation Age of Onset   Breast cancer Mother 38       contralateral breast ca dx 21   Coronary artery disease Mother    Hypertension Mother    Hyperlipidemia Mother    Diabetes type II Mother    Lung cancer Mother 37   Diabetes Father    Hypertension Father    Mental illness Sister    Bipolar disorder Other    Depression Other    Schizophrenia Other    Cancer Other        MGF's sisters x2; unknown type    Social History Social History   Tobacco Use   Smoking status: Former    Current packs/day: 0.00    Types: Cigarettes    Start date: 33    Quit date: 1984    Years since quitting: 41.2    Passive exposure: Past   Smokeless tobacco: Never  Vaping Use   Vaping status: Never Used  Substance Use Topics   Alcohol use: Never   Drug use: Never     Allergies   Naproxen, Shellfish-derived products, Strawberry (diagnostic), Celecoxib, Glucosamine, Iron, Metoclopramide, Metolazone, and Statins   Review of  Systems Review of Systems  Genitourinary:  Positive for flank pain.     Physical Exam Triage Vital Signs ED Triage Vitals  Encounter Vitals Group     BP 10/22/23 0849 (!) 150/83     Systolic BP Percentile --      Diastolic BP Percentile --      Pulse Rate 10/22/23 0849 81     Resp 10/22/23 0849 20     Temp 10/22/23 0849 98.1 F (36.7 C)     Temp Source 10/22/23 0849 Oral     SpO2 10/22/23 0849 96 %     Weight --      Height --      Head Circumference --      Peak Flow --      Pain Score 10/22/23 0850 9     Pain Loc --      Pain Education --      Exclude from Growth Chart --    No data found.  Updated Vital Signs BP (!) 150/83 (BP Location: Left Arm)   Pulse 81   Temp 98.1 F (36.7 C) (Oral)   Resp 20   SpO2 96%   Visual Acuity Right Eye Distance:  Left Eye Distance:   Bilateral Distance:    Right Eye Near:   Left Eye Near:    Bilateral Near:     Physical Exam Musculoskeletal:       Back:     Comments: Tenderness bilateral to left and right lower rib area/flank.  Very tender to light palpation.  There is no rashes swelling or bruising.  No deformities.  Skin:    General: Skin is warm and dry.     Findings: No rash.  Neurological:     Mental Status: She is alert.      UC Treatments / Results  Labs (all labs ordered are listed, but only abnormal results are displayed) Labs Reviewed  POCT URINALYSIS DIP (MANUAL ENTRY) - Abnormal; Notable for the following components:      Result Value   Blood, UA trace-intact (*)    Protein Ur, POC =100 (*)    All other components within normal limits    EKG   Radiology No results found.  Procedures Procedures (including critical care time)  Medications Ordered in UC Medications - No data to display  Initial Impression / Assessment and Plan / UC Course  I have reviewed the triage vital signs and the nursing notes.  Pertinent labs & imaging results that were available during my care of the patient  were reviewed by me and considered in my medical decision making (see chart for details).     Muscular pain-urine without any concerns of infection.  There is trace blood and protein.  This may be chronic for her based on her kidney disease. No concern for nephro lithiasis or polynephritis at this point Doubt shingles due to being bilateral and there were no rashes. I am not sure if this new medication that she is taking is causing some issues with her sleep and positioning She may be deficient in magnesium or potassium I am prescribing a low-dose muscle relaxer to see if this helps.  She can take her pain medication as needed.  Recommend heat to the area, gentle stretching Recommend hydration with electrolytes to ensure her potassium, sodium and magnesium are adequate She can follow-up with her doctor for any future concerns Final Clinical Impressions(s) / UC Diagnoses   Final diagnoses:  Muscular pain     Discharge Instructions      Your urine did not show any signs of infection.  I believe this is muscular in nature.  I am given you low-dose muscle relaxer to try to see if this will help.  Recommend heat to the area.  Gentle stretching.  Also make sure you are staying hydrated this could be due to electrolyte deficiency like low magnesium or potassium.  You could do an electrolyte drink packet to see if this helps. Follow-up with your doctor for any continued issues    ED Prescriptions     Medication Sig Dispense Auth. Provider   tiZANidine (ZANAFLEX) 2 MG tablet Take 1 tablet (2 mg total) by mouth 3 (three) times daily. 30 tablet Janace Aris, FNP      PDMP not reviewed this encounter.   Janace Aris, FNP 10/22/23 1227

## 2023-10-24 ENCOUNTER — Ambulatory Visit

## 2023-10-24 ENCOUNTER — Telehealth: Payer: Self-pay

## 2023-10-24 ENCOUNTER — Encounter: Payer: Self-pay | Admitting: Family Medicine

## 2023-10-24 VITALS — Ht 65.0 in | Wt 222.0 lb

## 2023-10-24 DIAGNOSIS — Z0000 Encounter for general adult medical examination without abnormal findings: Secondary | ICD-10-CM

## 2023-10-24 NOTE — Progress Notes (Signed)
 Subjective:   Dana [name] is a 63 y.o. who presents for a Medicare Wellness preventive visit.  Visit Complete: Virtual I connected with  Dana [name] on 10/24/23 by a audio enabled telemedicine application and verified that I am speaking with the correct person using two identifiers.  Patient Location: Home  Provider Location: Home Office  I discussed the limitations of evaluation and management by telemedicine. The patient expressed understanding and agreed to proceed.  Vital Signs: Because this visit was a virtual/telehealth visit, some criteria may be missing or patient reported. Any vitals not documented were not able to be obtained and vitals that have been documented are patient reported.  VideoDeclined- This patient declined Librarian, academic. Therefore the visit was completed with audio only.  Persons Participating in Visit: Patient.  AWV Questionnaire: No: Patient Medicare AWV questionnaire was not completed prior to this visit.  Cardiac Risk Factors include: diabetes mellitus;dyslipidemia;sedentary lifestyle     Objective:    Today's Vitals   10/24/23 1004 10/24/23 1017  Weight: 222 lb (100.7 kg)   Height: 5\' 5"  (1.651 m)   PainSc:  9    Body mass index is 36.94 kg/m.     10/24/2023   10:23 AM 08/27/2023   11:07 AM 07/13/2023   12:45 PM 02/15/2023    2:22 PM 02/12/2023    2:56 PM 12/06/2022    7:52 PM 12/06/2022   10:41 AM  Advanced Directives  Does Patient Have a Medical Advance Directive? No Yes No  No No No  Type of Special educational needs teacher of [place];Living will       Does patient want to make changes to medical advance directive?  Yes (Inpatient - patient defers changing a medical advance directive and declines information at this time)       Copy of Healthcare Power of Attorney in Chart?  No - copy requested       Would patient like information on creating a medical advance directive? Yes  (MAU/Ambulatory/Procedural Areas - Information given)  No - Patient declined No - Patient declined  No - Patient declined No - Patient declined    Current Medications (verified) Outpatient Encounter Medications as of 10/24/2023  Medication Sig   acetaminophen (TYLENOL) 500 MG tablet Take 1,000 mg by mouth every 6 (six) hours as needed for mild pain (pain score 1-3) or headache.   allopurinol (ZYLOPRIM) 100 MG tablet Take 0.5 tablets (50 mg total) by mouth daily.   aspirin EC 81 MG tablet Take 1 tablet (81 mg total) by mouth daily. Swallow whole.   busPIRone (BUSPAR) 15 MG tablet Take 1 tablet (15 mg total) by mouth 3 (three) times daily.   carvedilol (COREG) 6.25 MG tablet Take 1.5 tablets (9.375 mg total) by mouth 2 (two) times daily.   Continuous Glucose Sensor (FREESTYLE LIBRE 2 SENSOR) MISC Inject 1 device into skin every 14 (fourteen) days.   doxepin (SINEQUAN) 150 MG capsule Take 1 capsule (150 mg total) by mouth at bedtime.   EPINEPHrine 0.3 mg/0.3 mL IJ SOAJ injection Use as directed for life-threatening allergic reaction.   Evolocumab (REPATHA SURECLICK) 140 MG/ML SOAJ Inject 140 mg into the skin every 14 (fourteen) days.   ezetimibe (ZETIA) 10 MG tablet Take 1 tablet (10 mg total) by mouth every evening.   gabapentin (NEURONTIN) 100 MG capsule Take 1 capsule (100 mg total) by mouth 2 (two) times daily.   Glucagon (GVOKE HYPOPEN 2-PACK) 0.5 MG/0.1ML SOAJ Inject 0.5  mg into the skin daily as needed.   HYDROcodone-acetaminophen (NORCO) 7.5-325 MG tablet Take 1 tablet by mouth 3 (three) times daily as needed.   insulin aspart (NOVOLOG FLEXPEN) 100 UNIT/ML FlexPen Inject 2 - 10 Units before meals based on SSI.   insulin glargine (LANTUS SOLOSTAR) 100 UNIT/ML Solostar Pen Inject 33-35 Units into the skin daily.   Insulin Pen Needle (BD PEN NEEDLE NANO 2ND GEN) 32G X 4 MM MISC Inject 1 each into the skin in the morning, at noon, in the evening, and at bedtime.   isosorbide mononitrate (IMDUR)  30 MG 24 hr tablet Take 1 tablet (30 mg total) by mouth daily.   latanoprost (XALATAN) 0.005 % ophthalmic solution Place 1 drop into both eyes at bedtime.   linaclotide (LINZESS) 145 MCG CAPS capsule Take 1 capsule (145 mcg total) by mouth daily before breakfast.   LORazepam (ATIVAN) 0.5 MG tablet Take 1 tablet (0.5 mg total) by mouth daily as needed for anxiety.   magnesium oxide (MAG-OX) 400 MG tablet Take 2 tablets (800 mg total) by mouth daily. Take 2 (400 mg) daily=800 mg   Menthol-Methyl Salicylate (ICY HOT EXTRA STRENGTH) 10-30 % CREA Apply 1 Application topically as needed (bilateral hips and knees).   nitroGLYCERIN (NITROSTAT) 0.4 MG SL tablet DISSOLVE ONE TABLET UNDER THE TONGUE EVERY 5 MINUTES AS NEEDED FOR CHEST PAIN.  DO NOT EXCEED A TOTAL OF 3 DOSES IN 15 MINUTES   Omega-3 Fatty Acids (FISH OIL PO) Take 1 capsule by mouth daily.   OXYGEN Inhale 2 L/min into the lungs at bedtime.   pantoprazole (PROTONIX) 40 MG tablet Take 1 tablet (40 mg total) by mouth daily.   potassium chloride SA (KLOR-CON M) 20 MEQ tablet Take 1 tablet (20 mEq total) by mouth daily.   Probiotic CAPS Take 1 capsule by mouth daily.   promethazine (PHENERGAN) 25 MG tablet Take 1 tablet (25 mg total) by mouth daily as needed.   ticagrelor (BRILINTA) 90 MG TABS tablet Take 1 tablet (90 mg total) by mouth 2 (two) times daily.   tiZANidine (ZANAFLEX) 2 MG tablet Take 1 tablet (2 mg total) by mouth 3 (three) times daily.   torsemide (DEMADEX) 20 MG tablet Take 3 tablets (60 mg total) by mouth daily.   No facility-administered encounter medications on file as of 10/24/2023.    Allergies (verified) Naproxen, Shellfish-derived products, Strawberry (diagnostic), Celecoxib, Glucosamine, Iron, Metoclopramide, Metolazone, and Statins   History: Past Medical History:  Diagnosis Date   Acquired absence of left great toe (HCC)    Acquired absence of other left toe(s) (HCC)    ACS (acute coronary syndrome) (HCC)    Anemia     Anoxic brain injury (HCC)    Anxiety    CAD (coronary artery disease)    DES mLAD 04/07/15; DES dRCA & DES pPDA 08/30/16; DES mCX & PTCA OM1 09/29/20   Chronic diastolic (congestive) heart failure (HCC)    Chronic pain    Chronic systolic (congestive) heart failure (HCC)    CKD (chronic kidney disease)    Contrast dye induced nephropathy, possible 09/03/2020   COPD (chronic obstructive pulmonary disease) (HCC)    COVID-19 virus infection 03/03/2023   Diabetes (HCC)type 1    Diastolic CHF (HCC)    Dyspnea    Family history of breast cancer 10/23/2021   Gastro-esophageal reflux disease without esophagitis    Hyperlipidemia    Hypertension    Hypertensive heart disease with heart failure (HCC)  Hypoxemia    Idiopathic gout, multiple sites    Leukocytosis 03/29/2022   Localized edema    Low back pain    Mitral regurgitation    Mitral regurgitation    Mixed hyperlipidemia    Mixed incontinence    Morbid (severe) obesity due to excess calories (HCC)    Neuromuscular disorder (HCC)    Neuropathy    Non-ST elevation (NSTEMI) myocardial infarction (HCC)    08/29/20, 09/29/20   OSA (obstructive sleep apnea) 09/03/2020   Other specified hypothyroidism    PAF (paroxysmal atrial fibrillation) (HCC)    s/p pulmonary vein isolation by cryoablation 10/04/15 Dr. Rudolpho Sevin in [facility]   PEA (Pulseless electrical activity) [facility])    ~ 01/13/21 at [facility] during admission DKA, volume overload, possible CAP, hypoxia s/p ACLS with ROSC ~ 10-15   Pneumonia    Primary insomnia    Rheumatoid arthritis (HCC)    Stroke (HCC)    Thyroid disease    Type 1 diabetes mellitus (HCC)    Vitamin D deficiency    Past Surgical History:  Procedure Laterality Date   ABDOMINAL HYSTERECTOMY     Partial- Still has both ovaries   CARDIOVASCULAR STRESS TEST  08/13/2014   Nuclear; Normal   CARPAL TUNNEL RELEASE     CATARACT EXTRACTION, BILATERAL     CESAREAN SECTION     CHOLECYSTECTOMY     CORONARY  ANGIOPLASTY WITH STENT PLACEMENT     3 blockages/ 1 stent   CORONARY BALLOON ANGIOPLASTY N/A 07/18/2023   Procedure: CORONARY BALLOON ANGIOPLASTY;  Surgeon: Kathleene Hazel, MD;  Location: [facility] INVASIVE CV LAB;  Service: Cardiovascular;  Laterality: N/A;   CORONARY PRESSURE/FFR STUDY N/A 09/07/2022   Procedure: INTRAVASCULAR PRESSURE WIRE/FFR STUDY;  Surgeon: Swaziland, Peter M, MD;  Location: [facility] INVASIVE CV LAB;  Service: Cardiovascular;  Laterality: N/A;   CORONARY STENT INTERVENTION N/A 09/29/2020   Procedure: CORONARY STENT INTERVENTION;  Surgeon: Tonny Bollman, MD;  Location: [facility] INVASIVE CV LAB;  Service: Cardiovascular;  Laterality: N/A;  CFX   CORONARY STENT INTERVENTION N/A 09/07/2022   Procedure: CORONARY STENT INTERVENTION;  Surgeon: Swaziland, Peter M, MD;  Location: [facility] INVASIVE CV LAB;  Service: Cardiovascular;  Laterality: N/A;   CORONARY STENT INTERVENTION N/A 02/13/2023   Procedure: CORONARY STENT INTERVENTION;  Surgeon: Kathleene Hazel, MD;  Location: [facility] INVAS[facility];  Service: Cardiovascular;  Laterality: N/A;  LAD   CORONARY ULTRASOUND/IVUS N/A 02/13/2023   Procedure: Coronary Ultrasound/IVUS;  Surgeon: Kathleene Hazel, MD;  Location: [facility] INVASIVE CV LAB;  Service: Cardiovascular;  Laterality: N/A;   CORONARY/GRAFT ACUTE MI REVASCULARIZATION N/A 12/08/2022   Procedure: Coronary/Graft Acute MI Revascularization;  Surgeon: Marykay Lex, MD;  Location: [facility] INVASIVE CV LAB;  Service: Cardiovascular;  Laterality: N/A;   EYE SURGERY     LEFT HEART CATH AND CORONARY ANGIOGRAPHY N/A 09/29/2020   Procedure: LEFT HEART CATH AND CORONARY ANGIOGRAPHY;  Surgeon: Tonny Bollman, MD;  Location: [facility] INVASIVE CV LAB;  Service: Cardiovascular;  Laterality: N/A;   LEFT HEART CATH AND CORONARY ANGIOGRAPHY N/A 09/07/2022   Procedure: LEFT HEART CATH AND CORONARY ANGIOGRAPHY;  Surgeon: Swaziland, Peter M, MD;  Location: [facility] INVASIVE CV LAB;  Service: Cardiovascular;  Laterality: N/A;   LEFT  HEART CATH AND CORONARY ANGIOGRAPHY N/A 12/08/2022   Procedure: LEFT HEART CATH AND CORONARY ANGIOGRAPHY;  Surgeon: Marykay Lex, MD;  Location: [facility] INVASIVE CV LAB;  Service: Cardiovascular;  Laterality: N/A;   LEFT HEART CATH AND CORONARY  ANGIOGRAPHY N/A 02/13/2023   Procedure: LEFT HEART CATH AND CORONARY ANGIOGRAPHY;  Surgeon: Kathleene Hazel, MD;  Location: [facility];  Service: Cardiovascular;  Laterality: N/A;   LEFT HEART CATH AND CORONARY ANGIOGRAPHY N/A 07/16/2023   Procedure: LEFT HEART CATH AND CORONARY ANGIOGRAPHY;  Surgeon: Kathleene Hazel, MD;  Location: [facility]ASIVE [facility];  Service: Cardiovascular;  Laterality: N/A;   RIGHT HEART CATH N/A 04/27/2021   Procedure: RIGHT HEART CATH;  Surgeon: Dolores Patty, MD;  Location: [facility] INV[facility]IVE [facility];  Service: Cardiovascular;  Laterality: N/A;   RIGHT/LEFT HEART CATH AND CORONARY ANGIOGRAPHY N/A 01/07/2017   Procedure: Right/Left Heart Cath and Coronary Angiography;  Surgeon: Laurey Morale, MD;  Location: [facility] INVASIVE CV LAB;  Service: Cardiovascular;  Laterality: N/A;   ROTATOR CUFF REPAIR     TEE WITHOUT CARDIOVERSION N/A 04/28/2021   Procedure: TRANSESOPHAGEAL ECHOCARDIOGRAM (TEE);  Surgeon: Dolores Patty, MD;  Location: [facility] ENDOSCOPY;  Service: Cardiovascular;  Laterality: N/A;   TEE WITHOUT CARDIOVERSION N/A 05/18/2021   Procedure: TRANSESOPHAGEAL ECHOCARDIOGRAM (TEE);  Surgeon: Orbie Pyo, MD;  Location: [facility] INVASIVE CV LAB;  Service: Cardiovascular;  Laterality: N/A;   TOE AMPUTATION Left    TRANSCATHETER MITRAL EDGE TO EDGE REPAIR N/A 05/18/2021   Procedure: MITRAL VALVE REPAIR;  Surgeon: Orbie Pyo, MD;  Location: [facility] INVASIVE CV LAB;  Service: Cardiovascular;  Laterality: N/A;   US ECHOCARDIOGRAPHY  05/2017   Normal   Family History  Problem Relation Age of Onset   Breast cancer Mother 12       contralateral breast ca dx 52   Coronary artery disease Mother    Hypertension Mother     Hyperlipidemia Mother    Diabetes type II Mother    Lung cancer Mother 73   Diabetes Father    Hypertension Father    Mental illness Sister    Bipolar disorder Other    Depression Other    Schizophrenia Other    Cancer Other        MGF's sisters x2; unknown type   Social History   Socioeconomic History   Marital status: Married    Spouse name: [name]   Number of children: 2   Years of education: 16   Highest education level: Master's degree (e.g., MA, MS, MEng, MEd, MSW, MBA)  Occupational History   Occupation: on disability/retired early former Engineer, site  Tobacco Use   Smoking status: Former    Current packs/day: 0.00    Types: Cigarettes    Start date: 10    Quit date: 1984    Years since quitting: 41.3    Passive exposure: Past   Smokeless tobacco: Never  Vaping Use   Vaping status: Never Used  Substance and Sexual Activity   Alcohol use: Never   Drug use: Never   Sexual activity: Not on file  Other Topics Concern   Not on file  Social History Narrative   Not on file   Social Drivers of Health   Financial Resource Strain: Low Risk  (10/24/2023)   Overall Financial Resource Strain (CARDIA)    Difficulty of Paying Living Expenses: Not hard at all  Food Insecurity: No Food Insecurity (10/24/2023)   Hunger Vital Sign    Worried About Running Out of Food in the Last Year: Never true    Ran Out of Food in the Last Year: Never true  Transportation Needs: No Transportation Needs (10/24/2023)   PRAPARE - Transportation  Lack of Transportation (Medical): No    Lack of Transportation (Non-Medical): No  Physical Activity: Insufficiently Active (10/24/2023)   Exercise Vital Sign    Days of Exercise per Week: 3 days    Minutes of Exercise per Session: 30 min  Stress: Stress Concern Present (10/24/2023)   [employer] of Occupational Health - Occupational Stress Questionnaire    Feeling of Stress : To some extent  Social Connections: Moderately Integrated  (10/24/2023)   Social Connection and Isolation Panel [NHANES]    Frequency of Communication with Friends and Family: More than three times a week    Frequency of Social Gatherings with Friends and Family: Three times a week    Attends Religious Services: More than 4 times per year    Active Member of Clubs or Organizations: No    Attends Banker Meetings: Never    Marital Status: Married    Tobacco Counseling Counseling given: Not Answered    Clinical Intake:  Pre-visit preparation completed: Yes  Pain : 0-10 Pain Score: 9  Pain Type: Acute pain Pain Location: Back Pain Descriptors / Indicators: Spasm Pain Onset: In the past 7 days     Diabetes: Yes CBG done?: No Did pt. bring in CBG monitor from home?: No  Lab Results  Component Value Date   HGBA1C 9.0 (H) 05/14/2023   HGBA1C 8.5 (H) 02/13/2023   HGBA1C 7.4 (H) 08/31/2022     How often do you need to have someone help you when you read instructions, pamphlets, or other written materials from your doctor or pharmacy?: 1 - Never  Interpreter Needed?: No  Information entered by :: Kandis Fantasia LPN   Activities of Daily Living     10/24/2023   10:21 AM 08/27/2023   11:07 AM  In your present state of health, do you have any difficulty performing the following activities:  Hearing? 0 0  Vision? 0 0  Difficulty concentrating or making decisions? 0 0  Walking or climbing stairs? 1   Dressing or bathing? 0   Doing errands, shopping? 1 0  Preparing Food and eating ? N   Using the Toilet? N   In the past six months, have you accidently leaked urine? N   Do you have problems with loss of bowel control? N   Managing your Medications? N   Managing your Finances? N   Housekeeping or managing your Housekeeping? Y     Patient Care Team: Blane Ohara, MD as PCP - General (Family Medicine) Bensimhon, Bevelyn Buckles, MD as PCP - Cardiology (Cardiology) Marcelyn Bruins, MD as Consulting Physician  (Allergy) Bensimhon, Bevelyn Buckles, MD as Consulting Physician (Cardiology) Beverly Gust, MD as Referring Physician (Gastroenterology) Doristine Bosworth., MD as Consulting Physician (Endocrinology) Margarette Canada., DPM as Consulting Physician (Podiatry) Donnetta Hail, MD as Consulting Physician (Rheumatology) Barnabas Lister, MD as Referring Physician (Nephrology) Orbie Pyo, MD as Consulting Physician (Cardiology) Dellia Beckwith, MD as Consulting Physician (Oncology) Marisue Brooklyn, MD as Referring Physician Doristine Bosworth., MD (Endocrinology) Randa Spike Kelton Pillar, LCSW as [facility] Care Management (Licensed Clinical Social Worker) Chrys Racer ([facility] [place]  Indicate any recent Medical Services you may have received from other than [facility] providers in the past year (date may be approximate).     Assessment:   This is a routine wellness examination for Dana [name].  Hearing/Vision screen Hearing Screening - Comments:: Denies hearing difficulties   Vision Screening - Comments::  Wears rx glasses - up to date with routine eye exams with [facility]    Goals Addressed   None    Depression Screen     10/24/2023   10:20 AM 10/04/2023   10:55 AM 07/29/2023   10:57 AM 07/12/2023   10:39 AM 05/13/2023    3:54 PM 04/04/2023   10:52 AM 03/26/2023    3:03 PM  PHQ 2/9 Scores  PHQ - 2 Score 3 3 0 5 1 2 2   PHQ- 9 Score 15 15 7 14  16 6     Fall Risk     10/24/2023   10:21 AM 08/01/2023    3:59 PM 07/29/2023   10:57 AM 07/12/2023   10:40 AM 04/04/2023   10:51 AM  Fall Risk   Falls in the past year? 0 1 1 0 1  Number falls in past yr: 0 0 0 0 1  Injury with Fall? 0 0 0 0 1  Risk for fall due to : No Fall Risks;Impaired balance/gait;Impaired mobility No Fall Risks No Fall Risks Impaired balance/gait;Impaired mobility Impaired balance/gait;Impaired mobility  Follow up Falls prevention discussed;Education  provided;Falls evaluation completed Falls evaluation completed  Falls evaluation completed Falls evaluation completed;Falls prevention discussed    MEDICARE RISK AT HOME:  Medicare Risk at Home Any stairs in or around the home?: No If so, are there any without handrails?: No Home free of loose throw rugs in walkways, pet beds, electrical cords, etc?: Yes Adequate lighting in your home to reduce risk of falls?: Yes Life alert?: No Use of a cane, walker or w/c?: Yes Grab bars in the bathroom?: Yes Shower chair or bench in shower?: No Elevated toilet seat or a handicapped toilet?: Yes  TIMED UP AND GO:  Was the test performed?  No  Cognitive Function: 6CIT completed        10/24/2023   10:23 AM 06/13/2020   11:07 AM  6CIT Screen  What Year? 0 points 0 points  What month? 0 points 0 points  What time? 0 points 0 points  Count back from 20 0 points 0 points  Months in reverse 0 points 0 points  Repeat phrase 0 points 0 points  Total Score 0 points 0 points    Immunizations Immunization History  Administered Date(s) Administered   Influenza Inj Mdck Quad Pf 03/23/2020, 04/04/2021   Influenza, Mdck, Trivalent,PF 6+ MOS(egg free) 04/04/2023   Influenza, Quadrivalent, Recombinant, Inj, Pf 05/21/2018   Influenza,inj,Quad PF,6+ Mos 03/28/2015, 03/07/2022   Influenza-Unspecified 04/15/2014, 05/16/2016, 03/12/2019   Moderna Covid-19 Vaccine Bivalent Booster 36yrs & up 05/09/2021   Moderna SARS-COV2 Booster Vaccination 06/28/2020   Moderna Sars-Covid-2 Vaccination 09/30/2019, 10/28/2019   PNEUMOCOCCAL CONJUGATE-20 07/29/2023   Pfizer(Comirnaty)Fall Seasonal Vaccine 12 years and older 04/04/2023   Pneumococcal Conjugate-13 05/16/2014, 06/14/2014   Pneumococcal Polysaccharide-23 05/18/2013    Screening Tests Health Maintenance  Topic Date Due   Zoster Vaccines- Shingrix (1 of 2) Never done   Cervical Cancer Screening (HPV/Pap Cotest)  Never done   MAMMOGRAM  03/15/2023    COVID-19 Vaccine (5 - Moderna risk 2024-25 season) 10/02/2023   DTaP/Tdap/Td (1 - Tdap) 07/28/2024 (Originally 02/01/1980)   HEMOGLOBIN A1C  11/12/2023   OPHTHALMOLOGY EXAM  11/19/2023   INFLUENZA VACCINE  02/14/2024   Diabetic kidney evaluation - Urine ACR  04/03/2024   FOOT EXAM  04/03/2024   Diabetic kidney evaluation - eGFR measurement  10/03/2024   Medicare Annual Wellness (AWV)  10/23/2024   Colonoscopy  04/06/2026  Pneumococcal Vaccine 30-31 Years old  Completed   Hepatitis C Screening  Completed   HIV Screening  Completed   HPV VACCINES  Aged Out   Meningococcal B Vaccine  Aged Out    Health Maintenance  Health Maintenance Due  Topic Date Due   Zoster Vaccines- Shingrix (1 of 2) Never done   Cervical Cancer Screening (HPV/Pap Cotest)  Never done   MAMMOGRAM  03/15/2023   COVID-19 Vaccine (5 - Moderna risk 2024-25 season) 10/02/2023   Health Maintenance Items Addressed: Mammogram scheduled for 12/10/23   Additional Screening:  Vision Screening: Recommended annual ophthalmology exams for early detection of glaucoma and other disorders of the eye.  Dental Screening: Recommended annual dental exams for proper oral hygiene  Community Resource Referral / Chronic Care Management: CRR required this visit?  No   CCM required this visit?  No     Plan:     I have personally reviewed and noted the following in the patient's chart:   Medical and social history Use of alcohol, tobacco or illicit drugs  Current medications and supplements including opioid prescriptions. Patient is not currently taking opioid prescriptions. Functional ability and status Nutritional status Physical activity Advanced directives List of other physicians Hospitalizations, surgeries, and ER visits in previous 12 months Vitals Screenings to include cognitive, depression, and falls Referrals and appointments  In addition, I have reviewed and discussed with patient certain preventive  protocols, quality metrics, and best practice recommendations. A written personalized care plan for preventive services as well as general preventive health recommendations were provided to patient.     Kandis Fantasia [place], [place]   [id number]/[id number]   After Visit Summary: (MyChart) Due to this being a telephonic visit, the after visit summary with patients personalized plan was offered to patient via MyChart   Notes:  See telephone encounter

## 2023-10-24 NOTE — Telephone Encounter (Signed)
 Patient was seen for AWV and expressed that she went to urgent care on 10/22/23 for muscle spasms in back.  According to records she was prescribed Tizanidine but she states that this has been ineffective.  Please advise.  Also states that she doesn't feel that she is able to come in for an office visit.

## 2023-10-24 NOTE — Patient Instructions (Signed)
 Dana [name] , Thank you for taking time to come for your Medicare Wellness Visit. I appreciate your ongoing commitment to your health goals. Please review the following plan we discussed and let me know if I can assist you in the future.   Referrals/Orders/Follow-Ups/Clinician Recommendations: Aim for 30 minutes of exercise or brisk walking, 6-8 glasses of water, and 5 servings of fruits and vegetables each day.  This is a list of the screening recommended for you and due dates:  Health Maintenance  Topic Date Due   Zoster (Shingles) Vaccine (1 of 2) Never done   Pap with HPV screening  Never done   Mammogram  03/15/2023   COVID-19 Vaccine (5 - Moderna risk 2024-25 season) 10/02/2023   DTaP/Tdap/Td vaccine (1 - Tdap) 07/28/2024*   Hemoglobin A1C  11/12/2023   Eye exam for diabetics  11/19/2023   Flu Shot  02/14/2024   Yearly kidney health urinalysis for diabetes  04/03/2024   Complete foot exam   04/03/2024   Yearly kidney function blood test for diabetes  10/03/2024   Medicare Annual Wellness Visit  10/23/2024   Colon Cancer Screening  04/06/2026   Pneumococcal Vaccination  Completed   Hepatitis C Screening  Completed   HIV Screening  Completed   HPV Vaccine  Aged Out   Meningitis B Vaccine  Aged Out  *Topic was postponed. The date shown is not the original due date.    Advanced directives: (ACP Link)Information on Advanced Care Planning can be found at [facility] [place] Advance Health Care Directives Advance Health Care Directives. [email or web address]   Next Medicare Annual Wellness Visit scheduled for next year: Yes

## 2023-10-25 ENCOUNTER — Other Ambulatory Visit: Payer: Self-pay

## 2023-10-25 MED ORDER — TIZANIDINE HCL 2 MG PO TABS: 4.0000 mg | ORAL_TABLET | Freq: Three times a day (TID) | ORAL | 0 refills | Status: DC

## 2023-10-25 NOTE — Telephone Encounter (Signed)
Patient was informed. Prescription was sent. 

## 2023-10-26 DIAGNOSIS — I10 Essential (primary) hypertension: Secondary | ICD-10-CM | POA: Diagnosis not present

## 2023-10-26 DIAGNOSIS — M791 Myalgia, unspecified site: Secondary | ICD-10-CM | POA: Diagnosis not present

## 2023-10-26 DIAGNOSIS — R109 Unspecified abdominal pain: Secondary | ICD-10-CM | POA: Diagnosis not present

## 2023-10-26 DIAGNOSIS — M549 Dorsalgia, unspecified: Secondary | ICD-10-CM | POA: Diagnosis not present

## 2023-10-26 DIAGNOSIS — R0989 Other specified symptoms and signs involving the circulatory and respiratory systems: Secondary | ICD-10-CM | POA: Diagnosis not present

## 2023-10-26 DIAGNOSIS — N390 Urinary tract infection, site not specified: Secondary | ICD-10-CM | POA: Diagnosis not present

## 2023-10-26 DIAGNOSIS — R0789 Other chest pain: Secondary | ICD-10-CM | POA: Diagnosis not present

## 2023-10-29 DIAGNOSIS — F411 Generalized anxiety disorder: Secondary | ICD-10-CM | POA: Diagnosis not present

## 2023-10-30 ENCOUNTER — Ambulatory Visit (HOSPITAL_BASED_OUTPATIENT_CLINIC_OR_DEPARTMENT_OTHER)
Admission: EM | Admit: 2023-10-30 | Discharge: 2023-10-30 | Disposition: A | Discharge status: Home / Self Care | Attending: Nurse Practitioner | Admitting: Nurse Practitioner

## 2023-10-30 ENCOUNTER — Encounter (HOSPITAL_BASED_OUTPATIENT_CLINIC_OR_DEPARTMENT_OTHER): Payer: Self-pay

## 2023-10-30 ENCOUNTER — Encounter: Payer: Self-pay | Admitting: Family Medicine

## 2023-10-30 DIAGNOSIS — M545 Low back pain, unspecified: Secondary | ICD-10-CM | POA: Diagnosis not present

## 2023-10-30 MED ORDER — TRIAMCINOLONE ACETONIDE 40 MG/ML IJ SUSP
40.0000 mg | Freq: Once | INTRAMUSCULAR | Status: AC
  Administered 2023-10-30: 40 mg via INTRAMUSCULAR

## 2023-10-30 NOTE — ED Triage Notes (Signed)
 Patient has long hx of back pain. Seen here on 4/8 for same. Patient has also been seen by PCP since that visit and ER on Saturday. States pain continues. Labs drawn and urine tested. Was given script for oxycodone at ER. Patient presents today asking for help on "figuring out" what is wrong. Patient states was told ortho can't help her. Has not seen a spine specialist yet.

## 2023-10-30 NOTE — Discharge Instructions (Addendum)
 You were given an injection of a steroid today to see if that would help alleviate the inflammation in your back.  Would recommend follow-up with your primary care provider to establish guidance with obtaining a spine specialist to help manage your discomfort.  May continue to use Tylenol and would recommend topical therapy such as diclofenac gel and Salonpas.  May use cool/warm compresses.  Try to sit upright if you can (sitting off to the side may strain other muscles and cause further discomforts).

## 2023-10-30 NOTE — ED Provider Notes (Signed)
 [facility]    CSN: [id number] Arrival date & time: 10/30/23  0931      History   Chief Complaint Chief Complaint  Patient presents with   Back Pain    HPI Dana [name] is a 63 y.o. female.   Patient presents requesting evaluation for a 1 week history of low back spasms.  Patient denies any injury at the onset of symptoms.  She has been seen in several different locations for this discomfort including the urgent care and the emergency department.  Her current treatments are tizanidine and Tylenol 2 capsules every 8-10 hours.  She states that the discomfort is impacting her ability to sleep and move.  She is sitting leaning over to the side at home (and also in a wheelchair in the office).  Patient states that she also suffers from neuropathy which likely contributed to a fall getting out of her recliner 2 days ago.  States she went to pivot around her recliner and impacted it, causing her to fall to the ground.  Denies any head trauma, LOC, neck, or back pain.  She is not experiencing any saddle anesthesias, unilateral weakness, or loss of bowel or bladder.  Patient has a multitude of other chronic conditions-no concerns directly related to those were voiced at today's visit.  In follow-up for this condition, patient states that she presented at an orthopedic office and was told that was not the appropriate place to help manage her symptoms.  She verbalizes frustration given her continued symptoms and unable to establish care somewhere to help appropriately manage this.  The history is provided by the patient and the spouse.  Back Pain Associated symptoms: no abdominal pain, no chest pain, no dysuria, no fever and no headaches     Past Medical History:  Diagnosis Date   Acquired absence of left great toe (HCC)    Acquired absence of other left toe(s) (HCC)    ACS (acute coronary syndrome) (HCC)    Anemia    Anoxic brain injury (HCC)    Anxiety    CAD (coronary artery  disease)    DES mLAD 04/07/15; DES dRCA & DES pPDA 08/30/16; DES mCX & PTCA OM1 09/29/20   Chronic diastolic (congestive) heart failure (HCC)    Chronic pain    Chronic systolic (congestive) heart failure (HCC)    CKD (chronic kidney disease)    Contrast dye induced nephropathy, possible 09/03/2020   COPD (chronic obstructive pulmonary disease) (HCC)    COVID-19 virus infection 03/03/2023   Diabetes (HCC)type 1    Diastolic CHF (HCC)    Dyspnea    Family history of breast cancer 10/23/2021   Gastro-esophageal reflux disease without esophagitis    Hyperlipidemia    Hypertension    Hypertensive heart disease with heart failure (HCC)    Hypoxemia    Idiopathic gout, multiple sites    Leukocytosis 03/29/2022   Localized edema    Low back pain    Mitral regurgitation    Mitral regurgitation    Mixed hyperlipidemia    Mixed incontinence    Morbid (severe) obesity due to excess calories (HCC)    Neuromuscular disorder (HCC)    Neuropathy    Non-ST elevation (NSTEMI) myocardial infarction (HCC)    08/29/20, 09/29/20   OSA (obstructive sleep apnea) 09/03/2020   Other specified hypothyroidism    PAF (paroxysmal atrial fibrillation) (HCC)    s/p pulmonary vein isolation by cryoablation 10/04/15 Dr. Rudolpho Sevin in [facility]  PEA (Pulseless electrical activity) (HCC)    ~ 01/13/21 at [facility] during admission DKA, volume overload, possible CAP, hypoxia s/p ACLS with ROSC ~ 10-15   Pneumonia    Primary insomnia    Rheumatoid arthritis (HCC)    Stroke (HCC)    Thyroid disease    Type 1 diabetes mellitus (HCC)    Vitamin D deficiency     Patient Active Problem List   Diagnosis Date Noted   Situational insomnia 10/04/2023   Moderately severe major depression (HCC) 10/04/2023   Acute on chronic combined systolic and diastolic CHF (congestive heart failure) (HCC) 09/16/2023   Encounter for screening mammogram for malignant neoplasm of breast 09/16/2023   Elevated troponin 08/28/2023    Abnormal echocardiogram 08/28/2023   Coronary artery disease involving native coronary artery of native heart without angina pectoris 08/28/2023   SOB (shortness of breath) 08/27/2023   URI with cough and congestion 08/22/2023   Hospital discharge follow-up 07/29/2023   Coronary artery disease involving native coronary artery of native heart with angina pectoris (HCC) 07/20/2023   Acute on chronic heart failure with preserved ejection fraction (HCC) 07/20/2023   Acute respiratory failure with hypoxia (HCC) 07/20/2023   Angina pectoris (HCC) 07/13/2023   Hyperglycemia 07/13/2023   Presence of heart assist device (HCC) 07/13/2023   Moderate recurrent major depression (HCC) 07/13/2023   Acute pain of right knee 07/13/2023   History of amputation of hallux (HCC) 05/24/2023   Bilateral hand pain 05/14/2023   Encounter for immunization 04/04/2023   Acute bronchitis due to COVID-19 virus 03/03/2023   Contrast-induced nephropathy 02/20/2023   Unstable angina (HCC) 09/07/2022   Chronic idiopathic constipation 09/01/2022   Hyperphosphatemia 01/09/2022   Genetic testing 11/07/2021   Family history of breast cancer 10/23/2021   GAD (generalized anxiety disorder) 07/11/2021   Mixed hyperlipidemia 07/11/2021   S/P mitral valve clip implantation 05/18/2021   History of cardiac arrest 02/06/2021   Impaired mobility and ADLs 01/20/2021   Left-sided weakness 01/20/2021   Drug-induced myopathy 01/03/2021   Secondary hyperparathyroidism of renal origin (HCC) 11/18/2020   Vitamin D insufficiency 11/18/2020   CKD stage 4 due to type 1 diabetes mellitus (HCC) 10/13/2020   NSTEMI (non-ST elevated myocardial infarction) (HCC) 09/03/2020   Contrast dye induced nephropathy, possible 09/03/2020   OSA on CPAP 09/03/2020   Insulin dependent type 2 diabetes mellitus (HCC) 09/03/2020   Need for COVID-19 vaccine 06/28/2020   Chronic respiratory failure with hypoxia (HCC) 03/23/2020   Chronic gout without  tophus 03/23/2020   Pain in both hands 03/23/2020   Uncomplicated opioid dependence (HCC) 03/23/2020   Depression, major, recurrent, mild (HCC) 12/21/2019   Hypomagnesemia 12/21/2019   Lumbar back pain 10/05/2019   Hypertensive heart and renal disease with congestive heart failure (HCC) 09/18/2019   Demand ischemia (HCC)    Chronic heart failure with preserved ejection fraction (HFpEF) (HCC) 11/07/2017   Class 2 severe obesity due to excess calories with serious comorbidity and body mass index (BMI) of 39.0 to 39.9 in adult (HCC) 11/07/2017   Hyperlipidemia LDL goal <70 12/14/2016   Anemia in CKD (chronic kidney disease) 03/23/2016   Chronic pain syndrome 03/23/2016   Coronary artery disease of native artery of native heart with stable angina pectoris (HCC) 03/23/2016   GERD without esophagitis 03/23/2016   On home oxygen therapy 03/23/2016   S/P ablation of atrial fibrillation 10/04/2015   Acute renal failure superimposed on stage 3b chronic kidney disease (HCC) 08/30/2015   Diabetic polyneuropathy  associated with type 2 diabetes mellitus (HCC) 04/02/2014    Past Surgical History:  Procedure Laterality Date   ABDOMINAL HYSTERECTOMY     Partial- Still has both ovaries   CARDIOVASCULAR STRESS TEST  08/13/2014   Nuclear; Normal   CARPAL TUNNEL RELEASE     CATARACT EXTRACTION, BILATERAL     CESAREAN SECTION     CHOLECYSTECTOMY     CORONARY ANGIOPLASTY WITH STENT PLACEMENT     3 blockages/ 1 stent   CORONARY BALLOON ANGIOPLASTY N/A 07/18/2023   Procedure: CORONARY BALLOON ANGIOPLASTY;  Surgeon: Kathleene Hazel, MD;  Location: [facility] IN[facility]AS[facility] LAB;  Service: Cardiovascular;  Laterality: N/A;   CORONARY PRESSURE/FFR STUDY N/A 09/07/2022   Procedure: INTRAVASCULAR PRESSURE WIRE/FFR STUDY;  Surgeon: Swaziland, Peter M, MD;  Location: [facility] INVASIVE CV LAB;  Service: Cardiovascular;  Laterality: N/A;   CORONARY STENT INTERVENTION N/A 09/29/2020   Procedure: CORONARY STENT INTERVENTION;   Surgeon: Tonny Bollman, MD;  Location: [facility] INVASIVE CV LAB;  Service: Cardiovascular;  Laterality: N/A;  CFX   CORONARY STENT INTERVENTION N/A 09/07/2022   Procedure: CORONARY STENT INTERVENTION;  Surgeon: Swaziland, Peter M, MD;  Location: [facility] INVASIVE CV LAB;  Service: Cardiovascular;  Laterality: N/A;   CORONARY STENT INTERVENTION N/A 02/13/2023   Procedure: CORONARY STENT INTERVENTION;  Surgeon: Kathleene Hazel, MD;  Location: [facility];  Service: Cardiovascular;  Laterality: N/A;  LAD   CORONARY ULTRASOUND/IVUS N/A 02/13/2023   Procedure: Coronary Ultrasound/IVUS;  Surgeon: Kathleene Hazel, MD;  Location: [facility];  Service: Cardiovascular;  Laterality: N/A;   CORONARY/GRAFT ACUTE MI REVASCULARIZATION N/A 12/08/2022   Procedure: Coronary/Graft Acute MI Revascularization;  Surgeon: Marykay Lex, MD;  Location: [facility] INVASIVE CV LAB;  Service: Cardiovascular;  Laterality: N/A;   EYE SURGERY     LEFT HEART CATH AND CORONARY ANGIOGRAPHY N/A 09/29/2020   Procedure: LEFT HEART CATH AND CORONARY ANGIOGRAPHY;  Surgeon: Tonny Bollman, MD;  Location: [facility] INVASIVE CV LAB;  Service: Cardiovascular;  Laterality: N/A;   LEFT HEART CATH AND CORONARY ANGIOGRAPHY N/A 09/07/2022   Procedure: LEFT HEART CATH AND CORONARY ANGIOGRAPHY;  Surgeon: Swaziland, Peter M, MD;  Location: [facility] INVASIVE CV LAB;  Service: Cardiovascular;  Laterality: N/A;   LEFT HEART CATH AND CORONARY ANGIOGRAPHY N/A 12/08/2022   Procedure: LEFT HEART CATH AND CORONARY ANGIOGRAPHY;  Surgeon: Marykay Lex, MD;  Location: [facility] INVASIVE CV LAB;  Service: Cardiovascular;  Laterality: N/A;   LEFT HEART CATH AND CORONARY ANGIOGRAPHY N/A 02/13/2023   Procedure: LEFT HEART CATH AND CORONARY ANGIOGRAPHY;  Surgeon: Kathleene Hazel, MD;  Location: [facility];  Service: Cardiovascular;  Laterality: N/A;   LEFT HEART CATH AND CORONARY ANGIOGRAPHY N/A 07/16/2023   Procedure: LEFT HEART CATH AND CORONARY  ANGIOGRAPHY;  Surgeon: Kathleene Hazel, MD;  Location: [facility];  Service: Cardiovascular;  Laterality: N/A;   RIGHT HEART CATH N/A 04/27/2021   Procedure: RIGHT HEART CATH;  Surgeon: Dolores Patty, MD;  Location: [facility];  Service: Cardiovascular;  Laterality: N/A;   RIGHT/LEFT HEART CATH AND CORONARY ANGIOGRAPHY N/A 01/07/2017   Procedure: Right/Left Heart Cath and Coronary Angiography;  Surgeon: Laurey Morale, MD;  Location: [facility] INVASIVE CV LAB;  Service: Cardiovascular;  Laterality: N/A;   ROTATOR CUFF REPAIR     TEE WITHOUT CARDIOVERSION N/A 04/28/2021   Procedure: TRANSESOPHAGEAL ECHOCARDIOGRAM (TEE);  Surgeon: Dolores Patty, MD;  Location: [facility] ENDOSCOPY;  Service: Cardiovascular;  Laterality: N/A;  TEE WITHOUT CARDIOVERSION N/A 05/18/2021   Procedure: TRANSESOPHAGEAL ECHOCARDIOGRAM (TEE);  Surgeon: Thukkani, Arun K, MD;  Location: [facility] INVASIVE CV LAB;  Service: Cardiovascular;  Laterality: N/A;   TOE AMPUTATION Left    TRANSCATHETER MITRAL EDGE TO EDGE REPAIR N/A 05/18/2021   Procedure: MITRAL VALVE REPAIR;  Surgeon: Kyra Phy, MD;  Location: [facility]VASIVE CV LAB;  Service: Cardiovascular;  Laterality: N/A;   US  ECHOCARDIOGRAPHY  05/2017   Normal    OB History   No obstetric history on file.      Home Medications    Prior to Admission medications   Medication Sig Start Date End Date Taking? Authorizing Provider  acetaminophen (TYLENOL) 500 MG tablet Take 1,000 mg by mouth every 6 (six) hours as needed for mild pain (pain score 1-3) or headache.    [provider]  allopurinol (ZYLOPRIM) 100 MG tablet Take 0.5 tablets (50 mg total) by mouth daily. 04/29/23   Mercy Stall, MD  aspirin EC 81 MG tablet Take 1 tablet (81 mg total) by mouth daily. Swallow whole. 08/13/23   Cox, Burleigh Carp, MD  busPIRone (BUSPAR) 15 MG tablet Take 1 tablet (15 mg total) by mouth 3 (three) times daily. 10/04/23 11/03/23  Janece Means, FNP  carvedilol  (COREG) 6.25 MG tablet Take 1.5 tablets (9.375 mg total) by mouth 2 (two) times daily. 08/05/23   Sheryl Donna, NP  Continuous Glucose Sensor (FREESTYLE LIBRE 2 SENSOR) MISC Inject 1 device into skin every 14 (fourteen) days. 08/14/23   Cox, Burleigh Carp, MD  doxepin (SINEQUAN) 150 MG capsule Take 1 capsule (150 mg total) by mouth at bedtime. 10/04/23   Janece Means, FNP  EPINEPHrine 0.3 mg/0.3 mL IJ SOAJ injection Use as directed for life-threatening allergic reaction. 04/29/23   Cox, Kirsten, MD  Evolocumab (REPATHA SURECLICK) 140 MG/ML SOAJ Inject 140 mg into the skin every 14 (fourteen) days. 04/29/23   Cox, Burleigh Carp, MD  ezetimibe (ZETIA) 10 MG tablet Take 1 tablet (10 mg total) by mouth every evening. 04/29/23   CoxBurleigh Carp, MD  gabapentin (NEURONTIN) 100 MG capsule Take 1 capsule (100 mg total) by mouth 2 (two) times daily. 09/02/23   Cox, Burleigh Carp, MD  Glucagon (GVOKE HYPOPEN 2-PACK) 0.5 MG/0.1ML SOAJ Inject 0.5 mg into the skin daily as needed. 07/12/23   Cox, Burleigh Carp, MD  HYDROcodone-acetaminophen (NORCO) 7.5-325 MG tablet Take 1 tablet by mouth 3 (three) times daily as needed. 10/16/23   Cyndi Drain, PA-C  insulin aspart (NOVOLOG FLEXPEN) 100 UNIT/ML FlexPen Inject 2 - 10 Units before meals based on SSI. 04/29/23   Cox, Kirsten, MD  insulin glargine (LANTUS SOLOSTAR) 100 UNIT/ML Solostar Pen Inject 33-35 Units into the skin daily. 08/29/23   Joseph, Preetha, MD  Insulin Pen Needle (BD PEN NEEDLE NANO 2ND GEN) 32G X 4 MM MISC Inject 1 each into the skin in the morning, at noon, in the evening, and at bedtime. 08/30/21   CoxBurleigh Carp, MD  isosorbide mononitrate (IMDUR) 30 MG 24 hr tablet Take 1 tablet (30 mg total) by mouth daily. 08/05/23   Sheryl Donna, NP  latanoprost (XALATAN) 0.005 % ophthalmic solution Place 1 drop into both eyes at bedtime. 08/14/23   Mercy Stall, MD  linaclotide Glory Larsen) 145 MCG CAPS capsule Take 1 capsule (145 mcg total) by mouth daily before breakfast. 04/29/23   Cox, Kirsten,  MD  LORazepam (ATIVAN) 0.5 MG tablet Take 1 tablet (0.5 mg total) by mouth daily as needed for anxiety. 04/29/23  Cox, Kirsten, MD  magnesium oxide (MAG-OX) 400 MG tablet Take 2 tablets (800 mg total) by mouth daily. Take 2 (400 mg) daily=800 mg 04/29/23   Cox, Kirsten, MD  Menthol-Methyl Salicylate (ICY HOT EXTRA STRENGTH) 10-30 % CREA Apply 1 Application topically as needed (bilateral hips and knees). 10/16/23   Marianne Sofia, PA-C  nitroGLYCERIN (NITROSTAT) 0.4 MG SL tablet DISSOLVE ONE TABLET UNDER THE TONGUE EVERY 5 MINUTES AS NEEDED FOR CHEST PAIN.  DO NOT EXCEED A TOTAL OF 3 DOSES IN 15 MINUTES 04/29/23   Cox, Kirsten, MD  Omega-3 Fatty Acids (FISH OIL PO) Take 1 capsule by mouth daily.    [provider]  OXYGEN Inhale 2 L/min into the lungs at bedtime.    [provider]  pantoprazole (PROTONIX) 40 MG tablet Take 1 tablet (40 mg total) by mouth daily. 04/29/23   Cox, Fritzi Mandes, MD  potassium chloride SA (KLOR-CON M) 20 MEQ tablet Take 1 tablet (20 mEq total) by mouth daily. 08/30/23   Zannie Cove, MD  Probiotic CAPS Take 1 capsule by mouth daily.    [provider]  promethazine (PHENERGAN) 25 MG tablet Take 1 tablet (25 mg total) by mouth daily as needed. 04/29/23   CoxFritzi Mandes, MD  ticagrelor (BRILINTA) 90 MG TABS tablet Take 1 tablet (90 mg total) by mouth 2 (two) times daily. 04/29/23   Cox, Fritzi Mandes, MD  tiZANidine (ZANAFLEX) 2 MG tablet Take 2 tablets (4 mg total) by mouth 3 (three) times daily. 10/25/23   Cox, Fritzi Mandes, MD  torsemide (DEMADEX) 20 MG tablet Take 3 tablets (60 mg total) by mouth daily. 10/17/23   Bensimhon, Bevelyn Buckles, MD    Family History Family History  Problem Relation Age of Onset   Breast cancer Mother 75       contralateral breast ca dx 70   Coronary artery disease Mother    Hypertension Mother    Hyperlipidemia Mother    Diabetes type II Mother    Lung cancer Mother 62   Diabetes Father    Hypertension Father    Mental illness  Sister    Bipolar disorder Other    Depression Other    Schizophrenia Other    Cancer Other        MGF's sisters x2; unknown type    Social History Social History   Tobacco Use   Smoking status: Former    Current packs/day: 0.00    Types: Cigarettes    Start date: 42    Quit date: 1984    Years since quitting: 41.3    Passive exposure: Past   Smokeless tobacco: Never  Vaping Use   Vaping status: Never Used  Substance Use Topics   Alcohol use: Never   Drug use: Never     Allergies   Naproxen, Shellfish-derived products, Strawberry (diagnostic), Celecoxib, Glucosamine, Iron, Metoclopramide, Metolazone, and Statins   Review of Systems Review of Systems  Constitutional:  Positive for activity change. Negative for appetite change and fever.  HENT:  Negative for congestion, rhinorrhea and sore throat.   Respiratory:  Negative for cough and shortness of breath.   Cardiovascular:  Negative for chest pain and palpitations.  Gastrointestinal:  Negative for abdominal pain, diarrhea and vomiting.  Genitourinary:  Negative for dysuria and urgency.  Musculoskeletal:  Positive for back pain, gait problem and myalgias. Negative for neck pain and neck stiffness.  Skin:  Negative for rash.  Neurological:  Negative for dizziness and headaches.   Review of systems  takes into account only acute concerns verbalized today, not chronic symptomologies already established with the patient.  Physical Exam Triage Vital Signs ED Triage Vitals [10/30/23 0955]  Encounter Vitals Group     BP 122/68     Systolic BP Percentile      Diastolic BP Percentile      Pulse Rate 78     Resp 20     Temp 98.3 F (36.8 C)     Temp Source Oral     SpO2 95 %     Weight      Height      Head Circumference      Peak Flow      Pain Score 9     Pain Loc      Pain Education      Exclude from Growth Chart    No data found.  Updated Vital Signs BP 122/68 (BP Location: Right Arm)   Pulse 78    Temp 98.3 F (36.8 C) (Oral)   Resp 20   SpO2 95%   Physical Exam Vitals and nursing note reviewed.  Constitutional:      Appearance: She is ill-appearing.     Comments: Chronically ill-appearing, seated in wheelchair, leaning to the left.  HENT:     Head: Normocephalic.  Cardiovascular:     Rate and Rhythm: Normal rate and regular rhythm.  Pulmonary:     Breath sounds: Rales present.     Comments: Faint crackles heard throughout bilateral lung fields-patient states that is normal for her. Abdominal:     General: Bowel sounds are normal.  Musculoskeletal:        General: Tenderness present.     Lumbar back: Tenderness present.     Comments: Patient with minimal ability for range of motion due to discomfort.  No direct thoracic or lumbar/sacral bony spinal tenderness.  Diffuse bilateral lumbar paraspinal muscle tenderness to palpation.  Equal lower extremity strength.  No external skin changes appreciated.  Skin:    General: Skin is warm and dry.  Neurological:     Mental Status: She is alert and oriented to person, place, and time.  Psychiatric:        Mood and Affect: Mood normal.        Behavior: Behavior normal.        Thought Content: Thought content normal.        Judgment: Judgment normal.    UC Treatments / Results  Labs (all labs ordered are listed, but only abnormal results are displayed) Labs Reviewed - No data to display  EKG   Radiology No results found.  Procedures Procedures (including critical care time)  Medications Ordered in UC Medications  triamcinolone acetonide (KENALOG-40) injection 40 mg (40 mg Intramuscular Given 10/30/23 1046)    Initial Impression / Assessment and Plan / UC Course  I have reviewed the triage vital signs and the nursing notes.  Pertinent labs & imaging results that were available during my care of the patient were reviewed by me and considered in my medical decision making (see chart for details).    Patient presents  for ongoing back pain that has been previously evaluated on several occasions.  Patient states that she has been taking Tylenol and tizanidine without any improvement in the symptoms.  Unfortunately, she was unable to be seen at an orthopedic provider office and is currently searching for someone to help manage this.  She has a multitude of co morbidities which further confounds the  management.  She is not currently experiencing any symptoms of cauda equina symptoms.  Unfortunately, NSAIDs are not a good option for her because of her cardiovascular health and also previous noted allergies.  Therefore-discussed a small low-dose triamcinolone injection to help calm some of the inflammation.  Will request that she does scheduled interval dosing of Tylenol along with topical thermal/topical analgesics.  I feel that other muscle relaxers would increase her fall risk and should be avoided.  Red flags were reviewed which would warrant emergency department evaluation.  Patient is aware to follow-up with her primary care provider for further guidance on who she should see to help manage the symptoms. Final Clinical Impressions(s) / UC Diagnoses   Final diagnoses:  Acute bilateral low back pain without sciatica     Discharge Instructions      You were given an injection of a steroid today to see if that would help alleviate the inflammation in your back.  Would recommend follow-up with your primary care provider to establish guidance with obtaining a spine specialist to help manage your discomfort.  May continue to use Tylenol and would recommend topical therapy such as diclofenac gel and Salonpas.  May use cool/warm compresses.  Try to sit upright if you can (sitting off to the side may strain other muscles and cause further discomforts).     ED Prescriptions   None    PDMP not reviewed this encounter.   Genene Kennel, FNP 10/30/23 1144

## 2023-10-31 NOTE — Telephone Encounter (Signed)
 Patient is scheduled with Dana [name] tomorrow. Patient unable to make it to the office today.

## 2023-11-01 ENCOUNTER — Ambulatory Visit: Admitting: Physician Assistant

## 2023-11-01 ENCOUNTER — Encounter: Payer: Self-pay | Admitting: Physician Assistant

## 2023-11-01 ENCOUNTER — Ambulatory Visit (HOSPITAL_BASED_OUTPATIENT_CLINIC_OR_DEPARTMENT_OTHER)
Admission: RE | Admit: 2023-11-01 | Discharge: 2023-11-01 | Disposition: A | Discharge status: Home / Self Care | Source: Ambulatory Visit | Attending: Physician Assistant | Admitting: Physician Assistant

## 2023-11-01 ENCOUNTER — Other Ambulatory Visit (HOSPITAL_BASED_OUTPATIENT_CLINIC_OR_DEPARTMENT_OTHER): Payer: Self-pay

## 2023-11-01 VITALS — BP 110/62 | HR 80 | Temp 97.8°F | Resp 16 | Ht 65.0 in | Wt 214.0 lb

## 2023-11-01 DIAGNOSIS — F5109 Other insomnia not due to a substance or known physiological condition: Secondary | ICD-10-CM

## 2023-11-01 DIAGNOSIS — M5416 Radiculopathy, lumbar region: Secondary | ICD-10-CM | POA: Diagnosis not present

## 2023-11-01 DIAGNOSIS — M545 Low back pain, unspecified: Secondary | ICD-10-CM

## 2023-11-01 DIAGNOSIS — I5032 Chronic diastolic (congestive) heart failure: Secondary | ICD-10-CM

## 2023-11-01 MED ORDER — GABAPENTIN 300 MG PO CAPS
300.0000 mg | ORAL_CAPSULE | Freq: Three times a day (TID) | ORAL | 1 refills | Status: DC
  Filled 2023-11-01: qty 90, 30d supply, fill #0
  Filled 2023-11-20 – 2023-12-11 (×2): qty 90, 30d supply, fill #1

## 2023-11-01 MED ORDER — ORPHENADRINE CITRATE ER 100 MG PO TB12
100.0000 mg | ORAL_TABLET | Freq: Two times a day (BID) | ORAL | 0 refills | Status: DC
  Filled 2023-11-01: qty 60, 30d supply, fill #0

## 2023-11-01 MED ORDER — DOXEPIN HCL 150 MG PO CAPS
150.0000 mg | ORAL_CAPSULE | Freq: Every day | ORAL | 3 refills | Status: DC
  Filled 2023-11-01: qty 30, 30d supply, fill #0

## 2023-11-01 NOTE — Progress Notes (Signed)
 Subjective:  Patient ID: Dana [name], female    DOB: 05/20/1961  Age: 63 y.o. MRN: [id number]  Chief Complaint  Patient presents with   Spasms   Back Pain    Discussed the use of AI scribe software for clinical note transcription with the patient, who gave verbal consent to proceed.  History of Present Illness   The patient, with a history of neuropathy and congestive heart failure (CHF), presents with severe back pain that has been ongoing for approximately two weeks. The pain is described as sharp, stabbing, and excessively tight, radiating down both legs. The pain is so severe that it has caused the patient to fall and has limited her mobility to the point where she is reliant on a walker. The patient reports that the pain is not alleviated by any specific movements or treatments.  The patient has tried various medications, including tizanidine , hydrocodone , oxycodone , and a steroid injection, but none have provided significant relief. The patient also reports that her gabapentin , which was initially prescribed for neuropathy, has not helped with the back pain.  In addition to the back pain, the patient has been experiencing swelling in her legs, which she attributes to her CHF. The swelling has made her legs feel heavy, which in turn has exacerbated the back pain. The patient has been using compression stockings to manage the swelling.  The patient's spouse is present during the consultation and provides additional information about the patient's condition and treatment history.            10/24/2023   10:20 AM 10/04/2023   10:55 AM 07/29/2023   10:57 AM 07/12/2023   10:39 AM 05/13/2023    3:54 PM  Depression screen PHQ 2/9  Decreased Interest 1 1 0 3 1  Down, Depressed, Hopeless 2 2 0 2 0  PHQ - 2 Score 3 3 0 5 1  Altered sleeping 3 3 3 3    Tired, decreased energy 3 3 1 3    Change in appetite 3 3 0 3   Feeling bad or failure about yourself  2 2 2  0   Trouble  concentrating 1 1 1  0   Moving slowly or fidgety/restless 0 0 0 0   Suicidal thoughts 0 0 0 0   PHQ-9 Score 15 15 7 14    Difficult doing work/chores  Extremely dIfficult Somewhat difficult Somewhat difficult         10/24/2023   10:21 AM  Fall Risk   Falls in the past year? 0  Number falls in past yr: 0  Injury with Fall? 0  Risk for fall due to : No Fall Risks;Impaired balance/gait;Impaired mobility  Follow up Falls prevention discussed;Education provided;Falls evaluation completed    Patient Care Team: Mercy Stall, MD as PCP - General (Family Medicine) Bensimhon, Rheta Celestine, MD as PCP - Cardiology (Cardiology) Brian Campanile, MD as Consulting Physician (Allergy ) Bensimhon, Rheta Celestine, MD as Consulting Physician (Cardiology) Carlin Cheney, MD as Referring Physician (Gastroenterology) Drury Geralds., MD as Consulting Physician (Endocrinology) Traci Fridge., DPM as Consulting Physician (Podiatry) Alanson Alliance, MD as Consulting Physician (Rheumatology) Geraldyne Kluver, MD as Referring Physician (Nephrology) Kyra Phy, MD as Consulting Physician (Cardiology) Nolia Baumgartner, MD as Consulting Physician (Oncology) Moss Argyle, MD as Referring Physician Drury Geralds., MD (Endocrinology) Gaile Jourdain Larene Pleasant, LCSW as [facility] Care Management (Licensed Clinical Social Worker) [facility] [place]   Review of  Systems  Constitutional:  Positive for activity change. Negative for appetite change, chills, fatigue and fever.  HENT:  Negative for congestion, drooling, ear discharge, ear pain, postnasal drip, rhinorrhea and sore throat.   Eyes:  Negative for pain and discharge.  Respiratory:  Negative for apnea, cough, chest tightness and shortness of breath.   Cardiovascular:  Positive for leg swelling. Negative for chest pain.  Gastrointestinal:  Negative for abdominal pain.  Endocrine:  Negative for cold intolerance and heat intolerance.  Genitourinary:  Negative for dysuria and hematuria.  Musculoskeletal:  Positive for arthralgias, back pain and myalgias.  Neurological:  Positive for dizziness. Negative for syncope and light-headedness.  Psychiatric/Behavioral:  Negative for agitation and confusion.     Current Outpatient Medications on File Prior to Visit  Medication Sig Dispense Refill   acetaminophen  (TYLENOL ) 500 MG tablet Take 1,000 mg by mouth every 6 (six) hours as needed for mild pain (pain score 1-3) or headache.     allopurinol  (ZYLOPRIM ) 100 MG tablet Take 0.5 tablets (50 mg total) by mouth daily. 90 tablet 1   aspirin  EC 81 MG tablet Take 1 tablet (81 mg total) by mouth daily. Swallow whole. 30 tablet 12   carvedilol  (COREG ) 6.25 MG tablet Take 1.5 tablets (9.375 mg total) by mouth 2 (two) times daily. 90 tablet 3   cefUROXime (CEFTIN) 500 MG tablet Take 500 mg by mouth 2 (two) times daily.     Continuous Glucose Sensor (FREESTYLE LIBRE 2 SENSOR) MISC Inject 1 device into skin every 14 (fourteen) days. 2 each 2   EPINEPHrine  0.3 mg/0.3 mL IJ SOAJ injection Use as directed for life-threatening allergic reaction. 4 each 3   Evolocumab  (REPATHA  SURECLICK) 140 MG/ML SOAJ Inject 140 mg into the skin every 14 (fourteen) days. 6 mL 1   ezetimibe  (ZETIA ) 10 MG tablet Take 1 tablet (10 mg total) by mouth every evening. 90 tablet 1   Glucagon  (GVOKE HYPOPEN  2-PACK) 0.5 MG/0.1ML SOAJ Inject 0.5 mg into the skin daily as needed. 0.2 mL 5   HYDROcodone -acetaminophen  (NORCO) 7.5-325 MG tablet Take 1 tablet by mouth 3 (three) times daily as needed. 90 tablet 0   insulin  aspart (NOVOLOG  FLEXPEN) 100 UNIT/ML FlexPen Inject 2 - 10 Units before meals based on SSI. 15 mL 3   insulin  glargine (LANTUS  SOLOSTAR) 100 UNIT/ML Solostar Pen Inject 33-35 Units into the skin daily.     Insulin  Pen Needle (BD PEN NEEDLE NANO 2ND GEN) 32G X 4 MM MISC Inject 1 each into the skin in the morning,  at noon, in the evening, and at bedtime. 600 each 3   isosorbide  mononitrate (IMDUR ) 30 MG 24 hr tablet Take 1 tablet (30 mg total) by mouth daily. 30 tablet 3   latanoprost  (XALATAN ) 0.005 % ophthalmic solution Place 1 drop into both eyes at bedtime. 2.5 mL 0   linaclotide  (LINZESS ) 145 MCG CAPS capsule Take 1 capsule (145 mcg total) by mouth daily before breakfast. 90 capsule 1   LORazepam  (ATIVAN ) 0.5 MG tablet Take 1 tablet (0.5 mg total) by mouth daily as needed for anxiety. 90 tablet 1   magnesium  oxide (MAG-OX) 400 MG tablet Take 2 tablets (800 mg total) by mouth daily. Take 2 (400 mg) daily=800 mg 180 tablet 1   Menthol -Methyl Salicylate  (ICY HOT EXTRA STRENGTH) 10-30 % CREA Apply 1 Application topically as needed (bilateral hips and knees). 85 g 1   nitroGLYCERIN  (NITROSTAT ) 0.4 MG SL tablet DISSOLVE ONE TABLET UNDER THE TONGUE EVERY 5  MINUTES AS NEEDED FOR CHEST PAIN.  DO NOT EXCEED A TOTAL OF 3 DOSES IN 15 MINUTES 50 tablet 3   Omega-3 Fatty Acids (FISH OIL PO) Take 1 capsule by mouth daily.     OXYGEN  Inhale 2 L/min into the lungs at bedtime.     pantoprazole  (PROTONIX ) 40 MG tablet Take 1 tablet (40 mg total) by mouth daily. 90 tablet 1   potassium chloride  SA (KLOR-CON  M) 20 MEQ tablet Take 1 tablet (20 mEq total) by mouth daily.     Probiotic CAPS Take 1 capsule by mouth daily.     promethazine  (PHENERGAN ) 25 MG tablet Take 1 tablet (25 mg total) by mouth daily as needed. 90 tablet 1   ticagrelor  (BRILINTA ) 90 MG TABS tablet Take 1 tablet (90 mg total) by mouth 2 (two) times daily. 180 tablet 1   torsemide  (DEMADEX ) 20 MG tablet Take 3 tablets (60 mg total) by mouth daily. 90 tablet 3   No current facility-administered medications on file prior to visit.   Past Medical History:  Diagnosis Date   Acquired absence of left great toe (HCC)    Acquired absence of other left toe(s) (HCC)    ACS (acute coronary syndrome) (HCC)    Anemia    Anoxic brain injury (HCC)    Anxiety     CAD (coronary artery disease)    DES mLAD 04/07/15; DES dRCA & DES pPDA 08/30/16; DES mCX & PTCA OM1 09/29/20   Chronic diastolic (congestive) heart failure (HCC)    Chronic pain    Chronic systolic (congestive) heart failure (HCC)    CKD (chronic kidney disease)    Contrast dye induced nephropathy, possible 09/03/2020   COPD (chronic obstructive pulmonary disease) (HCC)    COVID-19 virus infection 03/03/2023   Diabetes (HCC)type 1    Diastolic CHF (HCC)    Dyspnea    Family history of breast cancer 10/23/2021   Gastro-esophageal reflux disease without esophagitis    Hyperlipidemia    Hypertension    Hypertensive heart disease with heart failure (HCC)    Hypoxemia    Idiopathic gout, multiple sites    Leukocytosis 03/29/2022   Localized edema    Low back pain    Mitral regurgitation    Mitral regurgitation    Mixed hyperlipidemia    Mixed incontinence    Morbid (severe) obesity due to excess calories (HCC)    Neuromuscular disorder (HCC)    Neuropathy    Non-ST elevation (NSTEMI) myocardial infarction (HCC)    08/29/20, 09/29/20   OSA (obstructive sleep apnea) 09/03/2020   Other specified hypothyroidism    PAF (paroxysmal atrial fibrillation) (HCC)    s/p pulmonary vein isolation by cryoablation 10/04/15 Dr. Jearldine Mina in [facility]   PEA (Pulseless electrical activity) [facility])    ~ 01/13/21 at [facility] during admission DKA, volume overload, possible CAP, hypoxia s/p ACLS with ROSC ~ 10-15   Pneumonia    Primary insomnia    Rheumatoid arthritis (HCC)    Stroke (HCC)    Thyroid  disease    Type 1 diabetes mellitus (HCC)    Vitamin D  deficiency    Past Surgical History:  Procedure Laterality Date   ABDOMINAL HYSTERECTOMY     Partial- Still has both ovaries   CARDIOVASCULAR STRESS TEST  08/13/2014   Nuclear; Normal   CARPAL TUNNEL RELEASE     CATARACT EXTRACTION, BILATERAL     CESAREAN SECTION     CHOLECYSTECTOMY     CORONARY ANGIOPLASTY  WITH STENT PLACEMENT     3  blockages/ 1 stent   CORONARY BALLOON ANGIOPLASTY N/A 07/18/2023   Procedure: CORONARY BALLOON ANGIOPLASTY;  Surgeon: Odie Benne, MD;  Location: [facility] INVASIVE CV LAB;  Service: Cardiovascular;  Laterality: N/A;   CORONARY PRESSURE/FFR STUDY N/A 09/07/2022   Procedure: INTRAVASCULAR PRESSURE WIRE/FFR STUDY;  Surgeon: Swaziland, Peter M, MD;  Location: [facility] INVASIVE CV LAB;  Service: Cardiovascular;  Laterality: N/A;   CORONARY STENT INTERVENTION N/A 09/29/2020   Procedure: CORONARY STENT INTERVENTION;  Surgeon: Arnoldo Lapping, MD;  Location: [facility] INVASIVE CV LAB;  Service: Cardiovascular;  Laterality: N/A;  CFX   CORONARY STENT INTERVENTION N/A 09/07/2022   Procedure: CORONARY STENT INTERVENTION;  Surgeon: Swaziland, Peter M, MD;  Location: [facility] INVASIVE CV LAB;  Service: Cardiovascular;  Laterality: N/A;   CORONARY STENT INTERVENTION N/A 02/13/2023   Procedure: CORONARY STENT INTERVENTION;  Surgeon: Odie Benne, MD;  Location: [facility] INVASIVE CV LAB;  Service: Cardiovascular;  Laterality: N/A;  LAD   CORONARY ULTRASOUND/IVUS N/A 02/13/2023   Procedure: Coronary Ultrasound/IVUS;  Surgeon: Odie Benne, MD;  Location: [facility] INVASIVE CV LAB;  Service: Cardiovascular;  Laterality: N/A;   CORONARY/GRAFT ACUTE MI REVASCULARIZATION N/A 12/08/2022   Procedure: Coronary/Graft Acute MI Revascularization;  Surgeon: Arleen Lacer, MD;  Location: [facility] INVASIVE CV LAB;  Service: Cardiovascular;  Laterality: N/A;   EYE SURGERY     LEFT HEART CATH AND CORONARY ANGIOGRAPHY N/A 09/29/2020   Procedure: LEFT HEART CATH AND CORONARY ANGIOGRAPHY;  Surgeon: Arnoldo Lapping, MD;  Location: [facility] INVASIVE CV LAB;  Service: Cardiovascular;  Laterality: N/A;   LEFT HEART CATH AND CORONARY ANGIOGRAPHY N/A 09/07/2022   Procedure: LEFT HEART CATH AND CORONARY ANGIOGRAPHY;  Surgeon: Swaziland, Peter M, MD;  Location: [facility] INVASIVE CV LAB;  Service: Cardiovascular;  Laterality: N/A;   LEFT HEART CATH AND CORONARY ANGIOGRAPHY N/A  12/08/2022   Procedure: LEFT HEART CATH AND CORONARY ANGIOGRAPHY;  Surgeon: Arleen Lacer, MD;  Location: [facility] INVASIVE CV LAB;  Service: Cardiovascular;  Laterality: N/A;   LEFT HEART CATH AND CORONARY ANGIOGRAPHY N/A 02/13/2023   Procedure: LEFT HEART CATH AND CORONARY ANGIOGRAPHY;  Surgeon: Odie Benne, MD;  Location: [facility] INVASIVE CV LAB;  Service: Cardiovascular;  Laterality: N/A;   LEFT HEART CATH AND CORONARY ANGIOGRAPHY N/A 07/16/2023   Procedure: LEFT HEART CATH AND CORONARY ANGIOGRAPHY;  Surgeon: Odie Benne, MD;  Location: [facility] INVASIVE CV LAB;  Service: Cardiovascular;  Laterality: N/A;   RIGHT HEART CATH N/A 04/27/2021   Procedure: RIGHT HEART CATH;  Surgeon: Mardell Shade, MD;  Location: [facility] INVASIVE CV LAB;  Service: Cardiovascular;  Laterality: N/A;   RIGHT/LEFT HEART CATH AND CORONARY ANGIOGRAPHY N/A 01/07/2017   Procedure: Right/Left Heart Cath and Coronary Angiography;  Surgeon: Darlis Eisenmenger, MD;  Location: [facility] INVASIVE CV LAB;  Service: Cardiovascular;  Laterality: N/A;   ROTATOR CUFF REPAIR     TEE WITHOUT CARDIOVERSION N/A 04/28/2021   Procedure: TRANSESOPHAGEAL ECHOCARDIOGRAM (TEE);  Surgeon: Mardell Shade, MD;  Location: [facility] ENDOSCOPY;  Service: Cardiovascular;  Laterality: N/A;   TEE WITHOUT CARDIOVERSION N/A 05/18/2021   Procedure: TRANSESOPHAGEAL ECHOCARDIOGRAM (TEE);  Surgeon: Thukkani, Arun K, MD;  Location: [facility] INVASIVE CV LAB;  Service: Cardiovascular;  Laterality: N/A;   TOE AMPUTATION Left    TRANSCATHETER MITRAL EDGE TO EDGE REPAIR N/A 05/18/2021   Procedure: MITRAL VALVE REPAIR;  Surgeon: Kyra Phy, MD;  Location: [facility] INVASIVE CV LAB;  Service: Cardiovascular;  Laterality: N/A;  US  ECHOCARDIOGRAPHY  05/2017   Normal    Family History  Problem Relation Age of Onset   Breast cancer Mother 69       contralateral breast ca dx 88   Coronary artery disease Mother    Hypertension Mother    Hyperlipidemia Mother    Diabetes  type II Mother    Lung cancer Mother 60   Diabetes Father    Hypertension Father    Mental illness Sister    Bipolar disorder Other    Depression Other    Schizophrenia Other    Cancer Other        MGF's sisters x2; unknown type   Social History   Socioeconomic History   Marital status: Married    Spouse name: [name]   Number of children: 2   Years of education: 16   Highest education level: Master's degree (e.g., MA, MS, MEng, MEd, MSW, MBA)  Occupational History   Occupation: on disability/retired early former Engineer, site  Tobacco Use   Smoking status: Former    Current packs/day: 0.00    Types: Cigarettes    Start date: 32    Quit date: 1984    Years since quitting: 41.3    Passive exposure: Past   Smokeless tobacco: Never  Vaping Use   Vaping status: Never Used  Substance and Sexual Activity   Alcohol use: Never   Drug use: Never   Sexual activity: Not on file  Other Topics Concern   Not on file  Social History Narrative   Not on file   Social Drivers of Health   Financial Resource Strain: Low Risk  (10/24/2023)   Overall Financial Resource Strain (CARDIA)    Difficulty of Paying Living Expenses: Not hard at all  Food Insecurity: No Food Insecurity (10/24/2023)   Hunger Vital Sign    Worried About Running Out of Food in the Last Year: Never true    Ran Out of Food in the Last Year: Never true  Transportation Needs: No Transportation Needs (10/24/2023)   PRAPARE - Administrator, Civil Service (Medical): No    Lack of Transportation (Non-Medical): No  Physical Activity: Insufficiently Active (10/24/2023)   Exercise Vital Sign    Days of Exercise per Week: 3 days    Minutes of Exercise per Session: 30 min  Stress: Stress Concern Present (10/24/2023)   [employer] of Occupational Health - Occupational Stress Questionnaire    Feeling of Stress : To some extent  Social Connections: Moderately Integrated (10/24/2023)   Social Connection and  Isolation Panel [NHANES]    Frequency of Communication with Friends and Family: More than three times a week    Frequency of Social Gatherings with Friends and Family: Three times a week    Attends Religious Services: More than 4 times per year    Active Member of Clubs or Organizations: No    Attends Banker Meetings: Never    Marital Status: Married    Objective:  BP 110/62   Pulse 80   Temp 97.8 F (36.6 C) (Temporal)   Resp 16   Ht 5\' 5"  (1.651 m)   Wt 214 lb (97.1 kg)   SpO2 98%   BMI 35.61 kg/m      11/01/2023   10:14 AM 10/30/2023    9:55 AM 10/24/2023   10:04 AM  BP/Weight  Systolic BP 110 122 --  Diastolic BP 62 68 --  Wt. (Lbs) 214  222  BMI 35.61 kg/m2  36.94 kg/m2    Physical Exam Vitals reviewed.  Constitutional:      Appearance: Normal appearance.  Cardiovascular:     Rate and Rhythm: Normal rate and regular rhythm.     Heart sounds: Normal heart sounds.  Pulmonary:     Effort: Pulmonary effort is normal.     Breath sounds: Normal breath sounds.  Abdominal:     General: Bowel sounds are normal.     Palpations: Abdomen is soft.     Tenderness: There is no abdominal tenderness.  Musculoskeletal:        General: Tenderness present.     Lumbar back: Spasms and tenderness present. No swelling, deformity or signs of trauma. Decreased range of motion.  Neurological:     Mental Status: She is alert and oriented to person, place, and time.  Psychiatric:        Mood and Affect: Mood normal.        Behavior: Behavior normal.     Diabetic Foot Exam - Simple   No data filed      Lab Results  Component Value Date   WBC 11.1 (H) 08/29/2023   HGB 9.7 (L) 08/29/2023   HCT 30.2 (L) 08/29/2023   PLT 234 08/29/2023   GLUCOSE 103 (H) 08/29/2023   CHOL 92 (L) 07/12/2023   TRIG 81 07/12/2023   HDL 47 07/12/2023   LDLCALC 28 07/12/2023   ALT 14 08/28/2023   AST 12 (L) 08/28/2023   NA 139 08/29/2023   K 3.9 08/29/2023   CL 100 08/29/2023    CREATININE 2.38 (H) 08/29/2023   BUN 50 (H) 08/29/2023   CO2 28 08/29/2023   TSH 2.900 01/19/2022   INR 1.1 05/16/2021   HGBA1C 9.0 (H) 05/14/2023      Assessment & Plan:   Lumbar back pain Assessment & Plan: Severe back pain radiating to right leg, likely sciatica with muscular component. Current medications ineffective. Temporary relief from prednisone  and steroid injections. No signs of cauda equina syndrome. - Order lumbar spine x-ray. - Refer to spine specialist Dr. Ruthann Cover for evaluation and potential MRI. - Increase gabapentin  dosage. - Prescribe alternative muscle relaxer safe for kidneys. - Send prescriptions to preferred pharmacy.  Orders: -     DG Lumbar Spine Complete -     Ambulatory referral to Orthopedic Surgery -     Orphenadrine  Citrate ER; Take 1 tablet (100 mg total) by mouth 2 (two) times daily.  Dispense: 60 tablet; Refill: 0 -     Gabapentin ; Take 1 capsule (300 mg total) by mouth 3 (three) times daily.  Dispense: 90 capsule; Refill: 1  Situational insomnia -     Doxepin  HCl; Take 1 capsule (150 mg total) by mouth at bedtime.  Dispense: 30 capsule; Refill: 3  Chronic heart failure with preserved ejection fraction (HFpEF) (HCC) Assessment & Plan: CHF with right ankle swelling. Compression stockings and diuretics used. No worsening dyspnea. Fluid retention may exacerbate back pain.      Meds ordered this encounter  Medications   doxepin  (SINEQUAN ) 150 MG capsule    Sig: Take 1 capsule (150 mg total) by mouth at bedtime.    Dispense:  30 capsule    Refill:  3   orphenadrine  (NORFLEX ) 100 MG tablet    Sig: Take 1 tablet (100 mg total) by mouth 2 (two) times daily.    Dispense:  60 tablet    Refill:  0   gabapentin  (NEURONTIN ) 300 MG  capsule    Sig: Take 1 capsule (300 mg total) by mouth 3 (three) times daily.    Dispense:  90 capsule    Refill:  1    Orders Placed This Encounter  Procedures   DG Lumbar Spine Complete   Ambulatory referral to  Orthopedic Surgery    Chronic Kidney Disease (CKD) CKD with GFR 23. Under nephrology care. NSAIDs contraindicated.  Diabetes Mellitus Diabetes complicates pain management. No specific management discussed this visit.  Follow-up Follow-up with spine specialist Dr. Ruthann Cover for back pain management. Imaging and medication adjustments planned. - Expect call from [facility] or Dr. Ellery Guthrie office for scheduling. - Complete lumbar spine x-ray at designated center. - Pick up medications from pharmacy post-imaging.         Follow-up: Return if symptoms worsen or fail to improve.   I,Marla I Leal-Borjas,acting as a scribe for [employer], PA.,have documented all relevant documentation on the behalf of Odilia Bennett, PA,as directed by  Odilia Bennett, PA while in the presence of Odilia Bennett, [place].   An After Visit Summary was printed and given to the patient.  Odilia Bennett, [place] [facility] [phone]

## 2023-11-01 NOTE — Progress Notes (Deleted)
 Acute Office Visit  Subjective:    Patient ID: Dana [name], female    DOB: 02/28/1961, 63 y.o.   MRN: [id number]  Chief Complaint  Patient presents with   Spasms   Back Pain    Discussed the use of AI scribe software for clinical note transcription with the patient, who gave verbal consent to proceed.       HPI: Patient is in today for muscle spasms in her back. Patient states they are very painful and after spasm has ended, it leaves pain as if she has beaten.  Past Medical History:  Diagnosis Date   Acquired absence of left great toe (HCC)    Acquired absence of other left toe(s) (HCC)    ACS (acute coronary syndrome) (HCC)    Anemia    Anoxic brain injury (HCC)    Anxiety    CAD (coronary artery disease)    DES mLAD 04/07/15; DES dRCA & DES pPDA 08/30/16; DES mCX & PTCA OM1 09/29/20   Chronic diastolic (congestive) heart failure (HCC)    Chronic pain    Chronic systolic (congestive) heart failure (HCC)    CKD (chronic kidney disease)    Contrast dye induced nephropathy, possible 09/03/2020   COPD (chronic obstructive pulmonary disease) (HCC)    COVID-19 virus infection 03/03/2023   Diabetes (HCC)type 1    Diastolic CHF (HCC)    Dyspnea    Family history of breast cancer 10/23/2021   Gastro-esophageal reflux disease without esophagitis    Hyperlipidemia    Hypertension    Hypertensive heart disease with heart failure (HCC)    Hypoxemia    Idiopathic gout, multiple sites    Leukocytosis 03/29/2022   Localized edema    Low back pain    Mitral regurgitation    Mitral regurgitation    Mixed hyperlipidemia    Mixed incontinence    Morbid (severe) obesity due to excess calories (HCC)    Neuromuscular disorder (HCC)    Neuropathy    Non-ST elevation (NSTEMI) myocardial infarction (HCC)    08/29/20, 09/29/20   OSA (obstructive sleep apnea) 09/03/2020   Other specified hypothyroidism    PAF (paroxysmal atrial fibrillation) (HCC)    s/p pulmonary vein isolation  by cryoablation 10/04/15 Dr. Jearldine Mina in [facility]   PEA (Pulseless electrical activity) [facility])    ~ 01/13/21 at [facility] during admission DKA, volume overload, possible CAP, hypoxia s/p ACLS with ROSC ~ 10-15   Pneumonia    Primary insomnia    Rheumatoid arthritis (HCC)    Stroke (HCC)    Thyroid  disease    Type 1 diabetes mellitus (HCC)    Vitamin D  deficiency     Past Surgical History:  Procedure Laterality Date   ABDOMINAL HYSTERECTOMY     Partial- Still has both ovaries   CARDIOVASCULAR STRESS TEST  08/13/2014   Nuclear; Normal   CARPAL TUNNEL RELEASE     CATARACT EXTRACTION, BILATERAL     CESAREAN SECTION     CHOLECYSTECTOMY     CORONARY ANGIOPLASTY WITH STENT PLACEMENT     3 blockages/ 1 stent   CORONARY BALLOON ANGIOPLASTY N/A 07/18/2023   Procedure: CORONARY BALLOON ANGIOPLASTY;  Surgeon: Odie Benne, MD;  Location: [facility] INVASIVE CV LAB;  Service: Cardiovascular;  Laterality: N/A;   CORONARY PRESSURE/FFR STUDY N/A 09/07/2022   Procedure: INTRAVASCULAR PRESSURE WIRE/FFR STUDY;  Surgeon: Swaziland, Peter M, MD;  Location: [facility] INVASIVE CV LAB;  Service: Cardiovascular;  Laterality: N/A;   CORONARY  STENT INTERVENTION N/A 09/29/2020   Procedure: CORONARY STENT INTERVENTION;  Surgeon: Arnoldo Lapping, MD;  Location: [facility] INVASIVE CV LAB;  Service: Cardiovascular;  Laterality: N/A;  CFX   CORONARY STENT INTERVENTION N/A 09/07/2022   Procedure: CORONARY STENT INTERVENTION;  Surgeon: Swaziland, Peter M, MD;  Location: [facility] INVASIVE CV LAB;  Service: Cardiovascular;  Laterality: N/A;   CORONARY STENT INTERVENTION N/A 02/13/2023   Procedure: CORONARY STENT INTERVENTION;  Surgeon: Odie Benne, MD;  Location: [facility] LAB;  Service: Cardiovascular;  Laterality: N/A;  LAD   CORONARY ULTRASOUND/IVUS N/A 02/13/2023   Procedure: Coronary Ultrasound/IVUS;  Surgeon: Odie Benne, MD;  Location: [facility] INVAS[facility] CV LAB;  Service: Cardiovascular;  Laterality: N/A;    CORONARY/GRAFT ACUTE MI REVASCULARIZATION N/A 12/08/2022   Procedure: Coronary/Graft Acute MI Revascularization;  Surgeon: Arleen Lacer, MD;  Location: [facility] INVASIVE CV LAB;  Service: Cardiovascular;  Laterality: N/A;   EYE SURGERY     LEFT HEART CATH AND CORONARY ANGIOGRAPHY N/A 09/29/2020   Procedure: LEFT HEART CATH AND CORONARY ANGIOGRAPHY;  Surgeon: Arnoldo Lapping, MD;  Location: [facility] INVASIVE CV LAB;  Service: Cardiovascular;  Laterality: N/A;   LEFT HEART CATH AND CORONARY ANGIOGRAPHY N/A 09/07/2022   Procedure: LEFT HEART CATH AND CORONARY ANGIOGRAPHY;  Surgeon: Swaziland, Peter M, MD;  Location: [facility] INVASIVE CV LAB;  Service: Cardiovascular;  Laterality: N/A;   LEFT HEART CATH AND CORONARY ANGIOGRAPHY N/A 12/08/2022   Procedure: LEFT HEART CATH AND CORONARY ANGIOGRAPHY;  Surgeon: Arleen Lacer, MD;  Location: [facility] INVASIVE CV LAB;  Service: Cardiovascular;  Laterality: N/A;   LEFT HEART CATH AND CORONARY ANGIOGRAPHY N/A 02/13/2023   Procedure: LEFT HEART CATH AND CORONARY ANGIOGRAPHY;  Surgeon: Odie Benne, MD;  Location: [facility];  Service: Cardiovascular;  Laterality: N/A;   LEFT HEART CATH AND CORONARY ANGIOGRAPHY N/A 07/16/2023   Procedure: LEFT HEART CATH AND CORONARY ANGIOGRAPHY;  Surgeon: Odie Benne, MD;  Location: [facility];  Service: Cardiovascular;  Laterality: N/A;   RIGHT HEART CATH N/A 04/27/2021   Procedure: RIGHT HEART CATH;  Surgeon: Mardell Shade, MD;  Location: [facility] INVASIVE CV LAB;  Service: Cardiovascular;  Laterality: N/A;   RIGHT/LEFT HEART CATH AND CORONARY ANGIOGRAPHY N/A 01/07/2017   Procedure: Right/Left Heart Cath and Coronary Angiography;  Surgeon: Darlis Eisenmenger, MD;  Location: [facility] INVASIVE CV LAB;  Service: Cardiovascular;  Laterality: N/A;   ROTATOR CUFF REPAIR     TEE WITHOUT CARDIOVERSION N/A 04/28/2021   Procedure: TRANSESOPHAGEAL ECHOCARDIOGRAM (TEE);  Surgeon: Mardell Shade, MD;  Location: [facility] ENDOSCOPY;   Service: Cardiovascular;  Laterality: N/A;   TEE WITHOUT CARDIOVERSION N/A 05/18/2021   Procedure: TRANSESOPHAGEAL ECHOCARDIOGRAM (TEE);  Surgeon: Thukkani, Arun K, MD;  Location: [facility] INVASIVE CV LAB;  Service: Cardiovascular;  Laterality: N/A;   TOE AMPUTATION Left    TRANSCATHETER MITRAL EDGE TO EDGE REPAIR N/A 05/18/2021   Procedure: MITRAL VALVE REPAIR;  Surgeon: Kyra Phy, MD;  Location: [facility] INVASIVE CV LAB;  Service: Cardiovascular;  Laterality: N/A;   US  ECHOCARDIOGRAPHY  05/2017   Normal    Family History  Problem Relation Age of Onset   Breast cancer Mother 51       contralateral breast ca dx 52   Coronary artery disease Mother    Hypertension Mother    Hyperlipidemia Mother    Diabetes type II Mother    Lung cancer Mother 7   Diabetes Father    Hypertension Father  Mental illness Sister    Bipolar disorder Other    Depression Other    Schizophrenia Other    Cancer Other        MGF's sisters x2; unknown type    Social History   Socioeconomic History   Marital status: Married    Spouse name: [name]   Number of children: 2   Years of education: 16   Highest education level: Master's degree (e.g., MA, MS, MEng, MEd, MSW, MBA)  Occupational History   Occupation: on disability/retired early former Engineer, site  Tobacco Use   Smoking status: Former    Current packs/day: 0.00    Types: Cigarettes    Start date: 60    Quit date: 1984    Years since quitting: 41.3    Passive exposure: Past   Smokeless tobacco: Never  Vaping Use   Vaping status: Never Used  Substance and Sexual Activity   Alcohol use: Never   Drug use: Never   Sexual activity: Not on file  Other Topics Concern   Not on file  Social History Narrative   Not on file   Social Drivers of Health   Financial Resource Strain: Low Risk  (10/24/2023)   Overall Financial Resource Strain (CARDIA)    Difficulty of Paying Living Expenses: Not hard at all  Food Insecurity: No Food Insecurity  (10/24/2023)   Hunger Vital Sign    Worried About Running Out of Food in the Last Year: Never true    Ran Out of Food in the Last Year: Never true  Transportation Needs: No Transportation Needs (10/24/2023)   PRAPARE - Administrator, Civil Service (Medical): No    Lack of Transportation (Non-Medical): No  Physical Activity: Insufficiently Active (10/24/2023)   Exercise Vital Sign    Days of Exercise per Week: 3 days    Minutes of Exercise per Session: 30 min  Stress: Stress Concern Present (10/24/2023)   [employer] of Occupational Health - Occupational Stress Questionnaire    Feeling of Stress : To some extent  Social Connections: Moderately Integrated (10/24/2023)   Social Connection and Isolation Panel [NHANES]    Frequency of Communication with Friends and Family: More than three times a week    Frequency of Social Gatherings with Friends and Family: Three times a week    Attends Religious Services: More than 4 times per year    Active Member of Clubs or Organizations: No    Attends Banker Meetings: Never    Marital Status: Married  Catering manager Violence: Not At Risk (10/24/2023)   Humiliation, Afraid, Rape, and Kick questionnaire    Fear of Current or Ex-Partner: No    Emotionally Abused: No    Physically Abused: No    Sexually Abused: No    Outpatient Medications Prior to Visit  Medication Sig Dispense Refill   acetaminophen  (TYLENOL ) 500 MG tablet Take 1,000 mg by mouth every 6 (six) hours as needed for mild pain (pain score 1-3) or headache.     allopurinol  (ZYLOPRIM ) 100 MG tablet Take 0.5 tablets (50 mg total) by mouth daily. 90 tablet 1   aspirin  EC 81 MG tablet Take 1 tablet (81 mg total) by mouth daily. Swallow whole. 30 tablet 12   busPIRone  (BUSPAR ) 15 MG tablet Take 1 tablet (15 mg total) by mouth 3 (three) times daily. 90 tablet 0   carvedilol  (COREG ) 6.25 MG tablet Take 1.5 tablets (9.375 mg total) by mouth 2 (two) times daily.  90  tablet 3   cefUROXime (CEFTIN) 500 MG tablet Take 500 mg by mouth 2 (two) times daily.     Continuous Glucose Sensor (FREESTYLE LIBRE 2 SENSOR) MISC Inject 1 device into skin every 14 (fourteen) days. 2 each 2   doxepin  (SINEQUAN ) 150 MG capsule Take 1 capsule (150 mg total) by mouth at bedtime. 30 capsule 0   EPINEPHrine  0.3 mg/0.3 mL IJ SOAJ injection Use as directed for life-threatening allergic reaction. 4 each 3   Evolocumab  (REPATHA  SURECLICK) 140 MG/ML SOAJ Inject 140 mg into the skin every 14 (fourteen) days. 6 mL 1   ezetimibe  (ZETIA ) 10 MG tablet Take 1 tablet (10 mg total) by mouth every evening. 90 tablet 1   gabapentin  (NEURONTIN ) 100 MG capsule Take 1 capsule (100 mg total) by mouth 2 (two) times daily. 180 capsule 3   Glucagon  (GVOKE HYPOPEN  2-PACK) 0.5 MG/0.1ML SOAJ Inject 0.5 mg into the skin daily as needed. 0.2 mL 5   HYDROcodone -acetaminophen  (NORCO) 7.5-325 MG tablet Take 1 tablet by mouth 3 (three) times daily as needed. 90 tablet 0   insulin  aspart (NOVOLOG  FLEXPEN) 100 UNIT/ML FlexPen Inject 2 - 10 Units before meals based on SSI. 15 mL 3   insulin  glargine (LANTUS  SOLOSTAR) 100 UNIT/ML Solostar Pen Inject 33-35 Units into the skin daily.     Insulin  Pen Needle (BD PEN NEEDLE NANO 2ND GEN) 32G X 4 MM MISC Inject 1 each into the skin in the morning, at noon, in the evening, and at bedtime. 600 each 3   isosorbide  mononitrate (IMDUR ) 30 MG 24 hr tablet Take 1 tablet (30 mg total) by mouth daily. 30 tablet 3   latanoprost  (XALATAN ) 0.005 % ophthalmic solution Place 1 drop into both eyes at bedtime. 2.5 mL 0   linaclotide  (LINZESS ) 145 MCG CAPS capsule Take 1 capsule (145 mcg total) by mouth daily before breakfast. 90 capsule 1   LORazepam  (ATIVAN ) 0.5 MG tablet Take 1 tablet (0.5 mg total) by mouth daily as needed for anxiety. 90 tablet 1   magnesium  oxide (MAG-OX) 400 MG tablet Take 2 tablets (800 mg total) by mouth daily. Take 2 (400 mg) daily=800 mg 180 tablet 1    Menthol -Methyl Salicylate  (ICY HOT EXTRA STRENGTH) 10-30 % CREA Apply 1 Application topically as needed (bilateral hips and knees). 85 g 1   nitroGLYCERIN  (NITROSTAT ) 0.4 MG SL tablet DISSOLVE ONE TABLET UNDER THE TONGUE EVERY 5 MINUTES AS NEEDED FOR CHEST PAIN.  DO NOT EXCEED A TOTAL OF 3 DOSES IN 15 MINUTES 50 tablet 3   Omega-3 Fatty Acids (FISH OIL PO) Take 1 capsule by mouth daily.     OXYGEN  Inhale 2 L/min into the lungs at bedtime.     pantoprazole  (PROTONIX ) 40 MG tablet Take 1 tablet (40 mg total) by mouth daily. 90 tablet 1   potassium chloride  SA (KLOR-CON  M) 20 MEQ tablet Take 1 tablet (20 mEq total) by mouth daily.     Probiotic CAPS Take 1 capsule by mouth daily.     promethazine  (PHENERGAN ) 25 MG tablet Take 1 tablet (25 mg total) by mouth daily as needed. 90 tablet 1   ticagrelor  (BRILINTA ) 90 MG TABS tablet Take 1 tablet (90 mg total) by mouth 2 (two) times daily. 180 tablet 1   tiZANidine  (ZANAFLEX ) 2 MG tablet Take 2 tablets (4 mg total) by mouth 3 (three) times daily. 60 tablet 0   torsemide  (DEMADEX ) 20 MG tablet Take 3 tablets (60 mg total) by  mouth daily. 90 tablet 3   No facility-administered medications prior to visit.    Allergies  Allergen Reactions   Naproxen Hives and Rash   Shellfish-Derived Products Hives, Swelling and Other (See Comments)    Angioedema    Strawberry (Diagnostic) Hives   Celecoxib Other (See Comments)    Stomach pain   Glucosamine Hives, Swelling and Other (See Comments)    Angioedema    Iron  Hives and Other (See Comments)    IV iron  transfusion - hives - Feb 2022 hospitalization  Patient said she CAN tolerate if given Benadryl  first   Metoclopramide Other (See Comments)    Facial twitching and stuttering   Metolazone  Other (See Comments)    Acute renal failure   Statins Other (See Comments)    Muscle pain    Review of Systems  Constitutional:  Negative for chills, diaphoresis, fatigue and fever.  HENT:  Negative for congestion,  ear pain and sinus pain.   Eyes: Negative.   Respiratory:  Negative for cough and shortness of breath.   Cardiovascular:  Negative for chest pain.  Gastrointestinal:  Negative for abdominal pain, constipation, nausea and vomiting.  Endocrine: Negative.   Genitourinary:  Negative for dysuria.  Musculoskeletal:  Positive for back pain. Negative for arthralgias.  Allergic/Immunologic: Negative.   Neurological:  Negative for weakness and headaches.  Hematological: Negative.   Psychiatric/Behavioral:  Negative for dysphoric mood. The patient is not nervous/anxious.        Objective:        11/01/2023   10:14 AM 10/30/2023    9:55 AM 10/24/2023   10:04 AM  Vitals with BMI  Height 5\' 5"   5\' 5"   Weight 214 lbs  222 lbs  BMI 35.61  36.94  Systolic 110 122 --  Diastolic 62 68 --  Pulse 80 78     No data found.   Physical Exam  Health Maintenance Due  Topic Date Due   Zoster Vaccines- Shingrix (1 of 2) Never done   Cervical Cancer Screening (HPV/Pap Cotest)  Never done   MAMMOGRAM  03/15/2023   COVID-19 Vaccine (5 - Moderna risk 2024-25 season) 10/02/2023    There are no preventive care reminders to display for this patient.   Lab Results  Component Value Date   TSH 2.900 01/19/2022   Lab Results  Component Value Date   WBC 11.1 (H) 08/29/2023   HGB 9.7 (L) 08/29/2023   HCT 30.2 (L) 08/29/2023   MCV 86.3 08/29/2023   PLT 234 08/29/2023   Lab Results  Component Value Date   NA 139 08/29/2023   K 3.9 08/29/2023   CO2 28 08/29/2023   GLUCOSE 103 (H) 08/29/2023   BUN 50 (H) 08/29/2023   CREATININE 2.38 (H) 08/29/2023   BILITOT 0.8 08/28/2023   ALKPHOS 125 08/28/2023   AST 12 (L) 08/28/2023   ALT 14 08/28/2023   PROT 7.3 08/28/2023   ALBUMIN  2.9 (L) 08/28/2023   CALCIUM  9.3 10/04/2023   ANIONGAP 11 08/29/2023   EGFR 25.0 10/04/2023   Lab Results  Component Value Date   CHOL 92 (L) 07/12/2023   Lab Results  Component Value Date   HDL 47 07/12/2023    Lab Results  Component Value Date   LDLCALC 28 07/12/2023   Lab Results  Component Value Date   TRIG 81 07/12/2023   Lab Results  Component Value Date   CHOLHDL 2.0 07/12/2023   Lab Results  Component Value Date   HGBA1C 9.0 (  H) 05/14/2023       Assessment & Plan:  There are no diagnoses linked to this encounter.   Assessment and Plan       No orders of the defined types were placed in this encounter.   No orders of the defined types were placed in this encounter.    Follow-up: No follow-ups on file.  An After Visit Summary was printed and given to the patient.  Odilia Bennett, [facility] [phone]

## 2023-11-01 NOTE — Patient Instructions (Signed)
 VISIT SUMMARY:  You came in today because of severe back pain that has been ongoing for about two weeks. The pain is sharp, stabbing, and radiates down both legs, making it difficult for you to move without a walker. You have tried several medications without significant relief. You also mentioned swelling in your legs, which you believe is related to your congestive heart failure (CHF).  YOUR PLAN:  -BACK PAIN WITH SCIATICA: Sciatica is a condition where pain radiates along the path of the sciatic nerve, which runs down each leg from the lower back. We will order a lumbar spine x-ray and refer you to a spine specialist, Dr. Lyle, for further evaluation and a potential MRI. Your gabapentin  dosage will be increased, and we will prescribe an alternative muscle relaxer that is safe for your kidneys. The prescriptions will be sent to your preferred pharmacy.  -CHRONIC HEART FAILURE (CHF): Congestive heart failure is a condition where the heart doesn't pump blood as well as it should. You are using compression stockings and diuretics to manage the swelling in your legs. There are no signs of worsening shortness of breath, but fluid retention may be making your back pain worse.  -CHRONIC KIDNEY DISEASE (CKD): Chronic kidney disease is a long-term condition where the kidneys do not work as well as they should. You are under the care of a nephrologist, and NSAIDs are not recommended for you.  -DIABETES MELLITUS: Diabetes is a condition that affects how your body processes blood sugar. While it complicates pain management, no specific changes were made to your diabetes management during this visit.  INSTRUCTIONS:  Please follow up with the spine specialist, Dr. Lyle, for back pain management. You will receive a call from [facility] or Dr. Chere office to schedule this appointment. Complete the lumbar spine x-ray at the designated center and pick up your medications from the pharmacy after the imaging  is done.

## 2023-11-04 ENCOUNTER — Encounter: Payer: Self-pay | Admitting: Physician Assistant

## 2023-11-04 NOTE — Assessment & Plan Note (Signed)
 CHF with right ankle swelling. Compression stockings and diuretics used. No worsening dyspnea. Fluid retention may exacerbate back pain.

## 2023-11-04 NOTE — Assessment & Plan Note (Signed)
 Severe back pain radiating to right leg, likely sciatica with muscular component. Current medications ineffective. Temporary relief from prednisone  and steroid injections. No signs of cauda equina syndrome. - Order lumbar spine x-ray. - Refer to spine specialist Dr. Ruthann Cover for evaluation and potential MRI. - Increase gabapentin  dosage. - Prescribe alternative muscle relaxer safe for kidneys. - Send prescriptions to preferred pharmacy.

## 2023-11-05 ENCOUNTER — Other Ambulatory Visit: Payer: Self-pay

## 2023-11-05 ENCOUNTER — Emergency Department (HOSPITAL_COMMUNITY)

## 2023-11-05 ENCOUNTER — Emergency Department (HOSPITAL_COMMUNITY)
Admission: EM | Admit: 2023-11-05 | Discharge: 2023-11-06 | Disposition: A | Discharge status: Home / Self Care | Attending: Emergency Medicine | Admitting: Emergency Medicine

## 2023-11-05 ENCOUNTER — Encounter (HOSPITAL_COMMUNITY): Payer: Self-pay

## 2023-11-05 DIAGNOSIS — S8252XA Displaced fracture of medial malleolus of left tibia, initial encounter for closed fracture: Secondary | ICD-10-CM | POA: Diagnosis not present

## 2023-11-05 DIAGNOSIS — S99912A Unspecified injury of left ankle, initial encounter: Secondary | ICD-10-CM | POA: Diagnosis present

## 2023-11-05 DIAGNOSIS — I130 Hypertensive heart and chronic kidney disease with heart failure and stage 1 through stage 4 chronic kidney disease, or unspecified chronic kidney disease: Secondary | ICD-10-CM | POA: Insufficient documentation

## 2023-11-05 DIAGNOSIS — R609 Edema, unspecified: Secondary | ICD-10-CM | POA: Diagnosis not present

## 2023-11-05 DIAGNOSIS — S82452A Displaced comminuted fracture of shaft of left fibula, initial encounter for closed fracture: Secondary | ICD-10-CM | POA: Diagnosis not present

## 2023-11-05 DIAGNOSIS — N189 Chronic kidney disease, unspecified: Secondary | ICD-10-CM | POA: Diagnosis not present

## 2023-11-05 DIAGNOSIS — W07XXXA Fall from chair, initial encounter: Secondary | ICD-10-CM | POA: Insufficient documentation

## 2023-11-05 DIAGNOSIS — J449 Chronic obstructive pulmonary disease, unspecified: Secondary | ICD-10-CM | POA: Insufficient documentation

## 2023-11-05 DIAGNOSIS — M7989 Other specified soft tissue disorders: Secondary | ICD-10-CM | POA: Insufficient documentation

## 2023-11-05 DIAGNOSIS — S82852A Displaced trimalleolar fracture of left lower leg, initial encounter for closed fracture: Secondary | ICD-10-CM | POA: Insufficient documentation

## 2023-11-05 DIAGNOSIS — Z7982 Long term (current) use of aspirin: Secondary | ICD-10-CM | POA: Diagnosis not present

## 2023-11-05 DIAGNOSIS — R4182 Altered mental status, unspecified: Secondary | ICD-10-CM | POA: Insufficient documentation

## 2023-11-05 DIAGNOSIS — Z79899 Other long term (current) drug therapy: Secondary | ICD-10-CM | POA: Diagnosis not present

## 2023-11-05 DIAGNOSIS — I509 Heart failure, unspecified: Secondary | ICD-10-CM | POA: Diagnosis not present

## 2023-11-05 DIAGNOSIS — I2510 Atherosclerotic heart disease of native coronary artery without angina pectoris: Secondary | ICD-10-CM | POA: Insufficient documentation

## 2023-11-05 DIAGNOSIS — E1140 Type 2 diabetes mellitus with diabetic neuropathy, unspecified: Secondary | ICD-10-CM | POA: Diagnosis not present

## 2023-11-05 DIAGNOSIS — Z794 Long term (current) use of insulin: Secondary | ICD-10-CM | POA: Diagnosis not present

## 2023-11-05 DIAGNOSIS — R739 Hyperglycemia, unspecified: Secondary | ICD-10-CM | POA: Diagnosis not present

## 2023-11-05 DIAGNOSIS — S82202A Unspecified fracture of shaft of left tibia, initial encounter for closed fracture: Secondary | ICD-10-CM | POA: Diagnosis not present

## 2023-11-05 MED ORDER — PROPOFOL 10 MG/ML IV BOLUS
0.5000 mg/kg | Freq: Once | INTRAVENOUS | Status: DC
  Filled 2023-11-05: qty 20

## 2023-11-05 MED ORDER — FENTANYL CITRATE PF 50 MCG/ML IJ SOSY
50.0000 ug | PREFILLED_SYRINGE | Freq: Once | INTRAMUSCULAR | Status: AC
  Administered 2023-11-05: 50 ug via INTRAVENOUS
  Filled 2023-11-05: qty 1

## 2023-11-05 MED ORDER — OXYCODONE HCL 5 MG PO TABS
5.0000 mg | ORAL_TABLET | ORAL | 0 refills | Status: DC | PRN
  Filled 2023-11-05: qty 10, 2d supply, fill #0

## 2023-11-05 MED ORDER — PROPOFOL 10 MG/ML IV BOLUS
INTRAVENOUS | Status: AC | PRN
  Administered 2023-11-05: 50 mg via INTRAVENOUS

## 2023-11-05 NOTE — Progress Notes (Signed)
 Orthopedic Tech Progress Note Patient Details:  Dana [name] 1960/10/30 [id number]  Ortho Devices Type of Ortho Device: Stirrup splint, Post (short) splint Ortho Device/Splint Location: LLE Ortho Device/Splint Interventions: Ordered, Application, Adjustment   Post Interventions Patient Tolerated: Well  [name] 11/05/2023, 11:06 PM

## 2023-11-05 NOTE — Sedation Documentation (Signed)
 Patient transported to CT with RN

## 2023-11-05 NOTE — ED Provider Notes (Signed)
 [name] EMERGENCY DEPARTMENT AT [facility] Provider Note   CSN: [id number] Arrival date & time: 11/05/23  2000     History  Chief Complaint  Patient presents with   Ankle Pain    Dana [name] is a 63 y.o. female with history of OSA, anoxic brain injury, CAD, CHF, CKD, COPD, diabetes, GERD, hyperlipidemia, hypertension, mitral regurgitation, paroxysmal A-fib, rheumatoid arthritis, chronic pain, diabetic neuropathy, who presents the emergency department complaining of ankle pain after injury.  Patient states that she was getting up out of her recliner about a week ago and was rotating when she stepped on her foot wrong and fell down.  She had some redness, but did not have much pain, swelling or bruising until today.  She was walking into her daughter's house and felt her pain was significantly worse.  Her visitor also has concern about some symptoms she has been experiencing since recently put on a new sleeping medication.  States that patient has been "slumped over" more often and patient reports that yesterday while trying to drink from a soda bottle she spilled on herself because she could hardly feel the left side of her mouth.  She is not experiencing the symptoms at present.   Ankle Pain      Home Medications Prior to Admission medications   Medication Sig Start Date End Date Taking? Authorizing Provider  oxyCODONE  (ROXICODONE ) 5 MG immediate release tablet Take 1 tablet (5 mg total) by mouth every 4 (four) hours as needed for severe pain (pain score 7-10). 11/05/23  Yes Terryl Niziolek T, PA-C  acetaminophen  (TYLENOL ) 500 MG tablet Take 1,000 mg by mouth every 6 (six) hours as needed for mild pain (pain score 1-3) or headache.    [provider]  allopurinol  (ZYLOPRIM ) 100 MG tablet Take 0.5 tablets (50 mg total) by mouth daily. 04/29/23   Mercy Stall, MD  aspirin  EC 81 MG tablet Take 1 tablet (81 mg total) by mouth daily. Swallow whole. 08/13/23   CoxBurleigh Carp, MD  carvedilol  (COREG ) 6.25 MG tablet Take 1.5 tablets (9.375 mg total) by mouth 2 (two) times daily. 08/05/23   Sheryl Donna, NP  cefUROXime (CEFTIN) 500 MG tablet Take 500 mg by mouth 2 (two) times daily. 10/27/23   [provider]  Continuous Glucose Sensor (FREESTYLE LIBRE 2 SENSOR) MISC Inject 1 device into skin every 14 (fourteen) days. 08/14/23   CoxBurleigh Carp, MD  doxepin  (SINEQUAN ) 150 MG capsule Take 1 capsule (150 mg total) by mouth at bedtime. 11/01/23   Odilia Bennett, PA  EPINEPHrine  0.3 mg/0.3 mL IJ SOAJ injection Use as directed for life-threatening allergic reaction. 04/29/23   Cox, Burleigh Carp, MD  Evolocumab  (REPATHA  SURECLICK) 140 MG/ML SOAJ Inject 140 mg into the skin every 14 (fourteen) days. 04/29/23   CoxBurleigh Carp, MD  ezetimibe  (ZETIA ) 10 MG tablet Take 1 tablet (10 mg total) by mouth every evening. 04/29/23   CoxBurleigh Carp, MD  gabapentin  (NEURONTIN ) 300 MG capsule Take 1 capsule (300 mg total) by mouth 3 (three) times daily. 11/01/23   Odilia Bennett, PA  Glucagon  (GVOKE HYPOPEN  2-PACK) 0.5 MG/0.1ML SOAJ Inject 0.5 mg into the skin daily as needed. 07/12/23   Cox, Burleigh Carp, MD  HYDROcodone -acetaminophen  (NORCO) 7.5-325 MG tablet Take 1 tablet by mouth 3 (three) times daily as needed. 10/16/23   Cyndi Drain, PA-C  insulin  aspart (NOVOLOG  FLEXPEN) 100 UNIT/ML FlexPen Inject 2 - 10 Units before meals based on SSI. 04/29/23   Cox, Burleigh Carp,  MD  insulin  glargine (LANTUS  SOLOSTAR) 100 UNIT/ML Solostar Pen Inject 33-35 Units into the skin daily. 08/29/23   Deforest Fast, MD  Insulin  Pen Needle (BD PEN NEEDLE NANO 2ND GEN) 32G X 4 MM MISC Inject 1 each into the skin in the morning, at noon, in the evening, and at bedtime. 08/30/21   CoxBurleigh Carp, MD  isosorbide  mononitrate (IMDUR ) 30 MG 24 hr tablet Take 1 tablet (30 mg total) by mouth daily. 08/05/23   Sheryl Donna, NP  latanoprost  (XALATAN ) 0.005 % ophthalmic solution Place 1 drop into both eyes at bedtime. 08/14/23   Mercy Stall,  MD  linaclotide  (LINZESS ) 145 MCG CAPS capsule Take 1 capsule (145 mcg total) by mouth daily before breakfast. 04/29/23   Cox, Burleigh Carp, MD  LORazepam  (ATIVAN ) 0.5 MG tablet Take 1 tablet (0.5 mg total) by mouth daily as needed for anxiety. 04/29/23   CoxBurleigh Carp, MD  magnesium  oxide (MAG-OX) 400 MG tablet Take 2 tablets (800 mg total) by mouth daily. Take 2 (400 mg) daily=800 mg 04/29/23   Cox, Kirsten, MD  Menthol -Methyl Salicylate  (ICY HOT EXTRA STRENGTH) 10-30 % CREA Apply 1 Application topically as needed (bilateral hips and knees). 10/16/23   Cyndi Drain, PA-C  nitroGLYCERIN  (NITROSTAT ) 0.4 MG SL tablet DISSOLVE ONE TABLET UNDER THE TONGUE EVERY 5 MINUTES AS NEEDED FOR CHEST PAIN.  DO NOT EXCEED A TOTAL OF 3 DOSES IN 15 MINUTES 04/29/23   Cox, Kirsten, MD  Omega-3 Fatty Acids (FISH OIL PO) Take 1 capsule by mouth daily.    [provider]  orphenadrine  (NORFLEX ) 100 MG tablet Take 1 tablet (100 mg total) by mouth 2 (two) times daily. 11/01/23   Odilia Bennett, PA  OXYGEN  Inhale 2 L/min into the lungs at bedtime.    [provider]  pantoprazole  (PROTONIX ) 40 MG tablet Take 1 tablet (40 mg total) by mouth daily. 04/29/23   Cox, Burleigh Carp, MD  potassium chloride  SA (KLOR-CON  M) 20 MEQ tablet Take 1 tablet (20 mEq total) by mouth daily. 08/30/23   Deforest Fast, MD  Probiotic CAPS Take 1 capsule by mouth daily.    [provider]  promethazine  (PHENERGAN ) 25 MG tablet Take 1 tablet (25 mg total) by mouth daily as needed. 04/29/23   CoxBurleigh Carp, MD  ticagrelor  (BRILINTA ) 90 MG TABS tablet Take 1 tablet (90 mg total) by mouth 2 (two) times daily. 04/29/23   Cox, Burleigh Carp, MD  torsemide  (DEMADEX ) 20 MG tablet Take 3 tablets (60 mg total) by mouth daily. 10/17/23   Bensimhon, Rheta Celestine, MD      Allergies    Naproxen, Shellfish-derived products, Strawberry (diagnostic), Celecoxib, Glucosamine, Iron , Metoclopramide, Metolazone , and Statins    Review of Systems   Review of Systems   Musculoskeletal:  Positive for arthralgias.  All other systems reviewed and are negative.   Physical Exam Updated Vital Signs BP 134/89   Pulse 95   Temp 98 F (36.7 C)   Resp (!) 24   Ht 5\' 5"  (1.651 m)   Wt 97.1 kg   SpO2 97%   BMI 35.61 kg/m  Physical Exam Vitals and nursing note reviewed.  Constitutional:      Appearance: Normal appearance.  HENT:     Head: Normocephalic and atraumatic.  Eyes:     Conjunctiva/sclera: Conjunctivae normal.  Cardiovascular:     Pulses:          Dorsalis pedis pulses are 2+ on the right side and 2+ on the left side.  Pulmonary:     Effort: Pulmonary effort is normal. No respiratory distress.  Musculoskeletal:     Comments: Left ankle with significant swelling, ecchymoses  Feet:     Comments: S/p left great toe amputation Skin:    General: Skin is warm and dry.  Neurological:     Mental Status: She is alert.     Comments: No facial droop, no slurred speech Moving all extremities without difficulty  Psychiatric:        Mood and Affect: Mood normal.        Behavior: Behavior normal.     ED Results / Procedures / Treatments   Labs (all labs ordered are listed, but only abnormal results are displayed) Labs Reviewed - No data to display  EKG None  Radiology CT Head Wo Contrast Result Date: 11/05/2023 CLINICAL DATA:  Unknown cause of mental status change EXAM: CT HEAD WITHOUT CONTRAST TECHNIQUE: Contiguous axial images were obtained from the base of the skull through the vertex without intravenous contrast. RADIATION DOSE REDUCTION: This exam was performed according to the departmental dose-optimization program which includes automated exposure control, adjustment of the mA and/or kV according to patient size and/or use of iterative reconstruction technique. COMPARISON:  01/07/2018 FINDINGS: Brain: Hypoattenuation within the right basal ganglia (series 2/image 17). This is new from 2019 but favored to represent a chronic infarct. No  intracranial hemorrhage or mass effect. No hydrocephalus. No extra-axial fluid collection. Vascular: No hyperdense vessel or unexpected calcification. Skull: No fracture or focal lesion. Sinuses/Orbits: Near complete opacification of the left sphenoid sinus with bony hyperostosis. Paranasal sinuses and mastoid air cells are otherwise well aerated. Globes are intact. Other: None. IMPRESSION: 1. Hypoattenuation within the right basal ganglia is new from 2019 but favored to represent a chronic infarct (there is mention of signal changes in the right basal ganglia on report from MRI 01/25/2021, however no images are available for comparison). If there is concern for acute infarct, MRI is recommended. 2. Chronic left sphenoid sinusitis. Electronically Signed   By: Rozell Cornet M.D.   On: 11/05/2023 23:32   CT Ankle Left Wo Contrast Result Date: 11/05/2023 CLINICAL DATA:  Ankle trauma. Fell 1 week ago. Assess complex ankle fractures. EXAM: CT OF THE LEFT ANKLE WITHOUT CONTRAST TECHNIQUE: Multidetector CT imaging of the left ankle was performed according to the standard protocol. Multiplanar CT image reconstructions were also generated. RADIATION DOSE REDUCTION: This exam was performed according to the departmental dose-optimization program which includes automated exposure control, adjustment of the mA and/or kV according to patient size and/or use of iterative reconstruction technique. COMPARISON:  Radiographs, same date. FINDINGS: Trimalleolar ankle fractures as demonstrated on the radiographs. Comminuted and mildly displaced distal fibular shaft fracture at and above the level of the ankle mortise. Maximum displacement is 5 mm. Complex comminuted fracture of the medial malleolus both at and above the level of the ankle mortise. No significant displacement. Mildly comminuted impaction type fracture involving the posterior malleolar region of the tibia with small articular bone fragments but no large displaced  fragment. The medial mortise is widened. The talus is intact. No visualized mid or hindfoot fractures. Large ankle joint effusion/hemarthrosis. Grossly by CT the major tendons are intact. Extensive small vessel calcifications consistent diabetes. IMPRESSION: 1. Trimalleolar ankle fractures as demonstrated on the radiographs. 2. Comminuted and mildly displaced distal fibular shaft fracture at and above the level of the ankle mortise. 3. Complex comminuted fracture of the medial malleolus both at and above the level  of the ankle mortise. No significant displacement. 4. Mildly comminuted impaction type fracture involving the posterior malleolar region of the tibia with small articular bone fragments but no large displaced fragment. 5. The medial mortise is widened. 6. Large ankle joint effusion/hemarthrosis. 7. Extensive small vessel calcifications consistent with diabetes. Electronically Signed   By: Marrian Siva M.D.   On: 11/05/2023 23:28   DG Ankle Complete Left Result Date: 11/05/2023 CLINICAL DATA:  Ankle pain after fall 1 week ago EXAM: LEFT ANKLE COMPLETE - 3+ VIEW COMPARISON:  None Available. FINDINGS: Acute mildly displaced fracture of the medial and lateral malleoli. The dominant lateral malleolar fragment is displaced laterally. Fracture lines extend into the ankle mortise. Mildly displaced fracture of the posterior malleolus. Marked soft tissue swelling about the ankle. IMPRESSION: Acute trimalleolar fracture. Electronically Signed   By: Rozell Cornet M.D.   On: 11/05/2023 20:51    Procedures .Ortho Injury Treatment  Date/Time: 11/05/2023 11:21 PM  Performed by: Leva Rayas, PA-C Authorized by: Sueellen Emery, MD   Consent:    Consent obtained:  Verbal   Consent given by:  Patient   Risks discussed:  Fracture   Alternatives discussed:  No treatment Universal protocol:    Procedure explained and questions answered to patient or proxy's satisfaction: yes     Imaging studies  available: yes     Immediately prior to procedure a time out was called: yes     Patient identity confirmed:  Arm band and verbally with patientInjury location: ankle Location details: left ankle Injury type: fracture Fracture type: trimalleolar Pre-procedure neurovascular assessment: neurovascularly intact Pre-procedure distal perfusion: normal Pre-procedure neurological function: normal Pre-procedure range of motion: reduced  Anesthesia: Local anesthesia used: no  Patient sedated: Yes. Refer to sedation procedure documentation for details of sedation. Manipulation performed: yes Skin traction used: yes Skeletal traction used: yes Reduction successful: no Immobilization: splint Splint type: short leg and ankle stirrup Splint Applied by: Ortho Tech Supplies used: Ortho-Glass Post-procedure neurovascular assessment: post-procedure neurovascularly intact Post-procedure distal perfusion: normal Post-procedure neurological function: normal Post-procedure range of motion: unchanged       Medications Ordered in ED Medications  propofol  (DIPRIVAN ) 10 mg/mL bolus/IV push 48.6 mg (48.6 mg Intravenous See Procedure Record 11/05/23 2321)  fentaNYL  (SUBLIMAZE ) injection 50 mcg (50 mcg Intravenous Given 11/05/23 2114)  propofol  (DIPRIVAN ) 10 mg/mL bolus/IV push (50 mg Intravenous Given 11/05/23 2253)    ED Course/ Medical Decision Making/ A&P Clinical Course as of 11/05/23 2357  Tue Nov 05, 2023  2211 Consulted with orthopedic surgeon Dr Curtiss Dowdy who would like CT to be performed after ankle reduction/splinting [LR]    Clinical Course User Index [LR] Miana Politte, Anna Kettering, PA-C                                 Medical Decision Making Amount and/or Complexity of Data Reviewed Radiology: ordered.  Risk Prescription drug management.   This patient is a 64 y.o. female  who presents to the ED for concern of L ankle injury.   Differential diagnoses prior to evaluation: The emergent  differential diagnosis includes, but is not limited to,  fracture, dislocation, ligamentous injury. This is not an exhaustive differential.   Past Medical History / Co-morbidities / Social History: OSA, anoxic brain injury, CAD, CHF, CKD, COPD, diabetes, GERD, hyperlipidemia, hypertension, mitral regurgitation, paroxysmal A-fib, rheumatoid arthritis, chronic pain, diabetic neuropathy  Physical Exam: Physical exam performed. The pertinent  findings include: Hypertensive, otherwise normal vital signs.  Left ankle with significant swelling and ecchymoses, strong DP pulse.  Lab Tests/Imaging studies: I personally interpreted labs/imaging and the pertinent results include:  XR left ankle with trimalleolar fracture. I agree with the radiologist interpretation.  CT head with possible chronic infarct, no acute findings.   Medications: I ordered medication including fentanyl , propofol  used during sedation.  I have reviewed the patients home medicines and have made adjustments as needed.  Consultations obtained: I consulted with orthopedic surgeon Dr Curtiss Dowdy who recommended: reduction, splint, and obtain post reduction CT for surgical planning, recommend outpatient follow up in their clinic   Disposition: After consideration of the diagnostic results and the patients response to treatment, I feel that emergency department workup does not suggest an emergent condition requiring admission or immediate intervention beyond what has been performed at this time. The plan is: discharge to home with orthopedic follow up. Given crutches and pain medication, and patient has wheelchair at home.  The patient is safe for discharge and has been instructed to return immediately for worsening symptoms, change in symptoms or any other concerns.  Final Clinical Impression(s) / ED Diagnoses Final diagnoses:  Closed trimalleolar fracture of left ankle, initial encounter    Rx / DC Orders ED Discharge Orders           Ordered    oxyCODONE  (ROXICODONE ) 5 MG immediate release tablet  Every 4 hours PRN        11/05/23 2354           Portions of this report may have been transcribed using voice recognition software. Every effort was made to ensure accuracy; however, inadvertent computerized transcription errors may be present.    Aleck Amis 11/05/23 2358    Sueellen Emery, MD 11/08/23 [id number]

## 2023-11-05 NOTE — Sedation Documentation (Signed)
 Patient denies pain and is resting comfortably.

## 2023-11-05 NOTE — ED Provider Notes (Signed)
 Physical Exam  BP 134/89   Pulse 95   Temp 98 F (36.7 C)   Resp (!) 24   Ht 5\' 5"  (1.651 m)   Wt 97.1 kg   SpO2 97%   BMI 35.61 kg/m   Physical Exam  Procedures  .Sedation  Date/Time: 11/05/2023 11:36 PM  Performed by: Sueellen Emery, MD Authorized by: Sueellen Emery, MD   Consent:    Consent obtained:  Verbal   Consent given by:  Patient   Risks discussed:  Allergic reaction, dysrhythmia, inadequate sedation, nausea, prolonged hypoxia resulting in organ damage, prolonged sedation necessitating reversal, respiratory compromise necessitating ventilatory assistance and intubation and vomiting   Alternatives discussed:  Analgesia without sedation, anxiolysis and regional anesthesia Universal protocol:    Procedure explained and questions answered to patient or proxy's satisfaction: yes     Relevant documents present and verified: yes     Test results available: yes     Imaging studies available: yes     Required blood products, implants, devices, and special equipment available: yes     Site/side marked: yes     Immediately prior to procedure, a time out was called: yes     Patient identity confirmed:  Verbally with patient Indications:    Procedure performed:  Fracture reduction   Procedure necessitating sedation performed by:  Physician performing sedation Pre-sedation assessment:    Time since last food or drink:  5   ASA classification: class 1 - normal, healthy patient     Mouth opening:  3 or more finger widths   Thyromental distance:  4 finger widths   Mallampati score:  I - soft palate, uvula, fauces, pillars visible   Neck mobility: normal     Pre-sedation assessments completed and reviewed: airway patency, cardiovascular function, hydration status, mental status, nausea/vomiting, pain level, respiratory function and temperature   A pre-sedation assessment was completed prior to the start of the procedure Immediate pre-procedure details:    Reassessment:  Patient reassessed immediately prior to procedure     Reviewed: vital signs, relevant labs/tests and NPO status     Verified: bag valve mask available, emergency equipment available, intubation equipment available, IV patency confirmed, oxygen  available and suction available   Procedure details (see MAR for exact dosages):    Preoxygenation:  Nasal cannula   Sedation:  Propofol    Intended level of sedation: deep   Intra-procedure monitoring:  Blood pressure monitoring, cardiac monitor, continuous pulse oximetry, frequent LOC assessments, frequent vital sign checks and continuous capnometry   Intra-procedure events: hypoxia     Intra-procedure management:  Supplemental oxygen    Total Provider sedation time (minutes):  30 Post-procedure details:   A post-sedation assessment was completed following the completion of the procedure.   Attendance: Constant attendance by certified staff until patient recovered     Recovery: Patient returned to pre-procedure baseline     Post-sedation assessments completed and reviewed: airway patency, cardiovascular function, hydration status, mental status, nausea/vomiting, pain level, respiratory function and temperature     Patient is stable for discharge or admission: yes     Procedure completion:  Tolerated well, no immediate complications Comments:     O2 sat dropped after propofol  induction briefly.  Oxygen  applied and pt stimulated.  Oxygen  did respond well and was weaned off. .Splint Application  Date/Time: 11/05/2023 11:37 PM  Performed by: Sueellen Emery, MD Authorized by: Sueellen Emery, MD   Consent:    Consent obtained:  Written   Consent given by:  Patient   Risks, benefits, and alternatives were discussed: yes     Alternatives discussed:  No treatment Universal protocol:    Patient identity confirmed:  Verbally with patient Pre-procedure details:    Distal neurologic exam:  Normal   Distal perfusion: distal pulses strong   Procedure  details:    Location:  Ankle   Ankle location:  R ankle   Strapping: yes     Splint type:  Ankle stirrup and short leg   Supplies:  Cotton padding and elastic bandage Post-procedure details:    Distal neurologic exam:  Normal   Distal perfusion: distal pulses strong     Procedure completion:  Tolerated well, no immediate complications   Post-procedure imaging: reviewed   .Reduction of dislocation  Date/Time: 11/05/2023 11:38 PM  Performed by: Sueellen Emery, MD Authorized by: Sueellen Emery, MD  Consent: Verbal consent obtained. Written consent obtained. Risks and benefits: risks, benefits and alternatives were discussed Consent given by: patient Patient identity confirmed: verbally with patient  Sedation: Patient sedated: yes Sedatives: propofol  Analgesia: fentanyl   Patient tolerance: patient tolerated the procedure well with no immediate complications Comments: Fracture occurred a week ago and did not want to move much due to significant swelling.            Sueellen Emery, MD 11/05/23 2340

## 2023-11-05 NOTE — Discharge Instructions (Addendum)
 You were seen emergency department today after an ankle injury.  As discussed you fractured your ankle in 3 places.  We attempted to get in better alignment and splinted.  We consulted with an orthopedic surgeon who will review your imaging and wants to follow-up with you in clinic to schedule surgical repair.  You can take ibuprofen or tylenol  for pain and I have sent a prescription for additional pain medication you can take for breakthrough pain.   Your CT scan of your head showed an old stroke. Please follow up with your PCP and neurologist.

## 2023-11-05 NOTE — Sedation Documentation (Signed)
 Patients oxygen  dropped, oxygen  increased to 6L nasal cannula and supplemented with BVM

## 2023-11-05 NOTE — ED Triage Notes (Signed)
 Patient reports to EMS that she has ankle pain from a fall a week ago, but had no pain, swelling, or bruising until today, able to bear weight until today as well. No falls, twisting, or injury today. Patient ankle appears swollen and bruised. CNS intact distal to injury.  BP 148/78 HR 80 98% RA CBG 312 R 18

## 2023-11-06 ENCOUNTER — Other Ambulatory Visit (HOSPITAL_COMMUNITY): Payer: Self-pay

## 2023-11-06 ENCOUNTER — Other Ambulatory Visit: Payer: Self-pay

## 2023-11-06 MED ORDER — OXYCODONE HCL 5 MG PO TABS
10.0000 mg | ORAL_TABLET | Freq: Once | ORAL | Status: AC
  Administered 2023-11-06: 10 mg via ORAL
  Filled 2023-11-06: qty 2

## 2023-11-06 NOTE — Progress Notes (Signed)
 Orthopedic Tech Progress Note Patient Details:  Dana [name] [birth year] [id number]  Ortho Devices Type of Ortho Device: Crutches Ortho Device/Splint Location: LLE Ortho Device/Splint Interventions: Ordered, Application, Adjustment   Post Interventions Patient Tolerated: Well Instructions Provided: Adjustment of device, Care of device, Poper ambulation with device  [name] 11/06/2023, 12:38 AM

## 2023-11-11 ENCOUNTER — Inpatient Hospital Stay (HOSPITAL_COMMUNITY)
Admission: AD | Admit: 2023-11-11 | Discharge: 2023-11-14 | DRG: 493 | Disposition: A | Discharge status: Home Health | Source: Ambulatory Visit | Attending: Family Medicine | Admitting: Family Medicine

## 2023-11-11 ENCOUNTER — Other Ambulatory Visit: Payer: Self-pay

## 2023-11-11 ENCOUNTER — Encounter (HOSPITAL_COMMUNITY): Payer: Self-pay

## 2023-11-11 ENCOUNTER — Encounter (HOSPITAL_COMMUNITY): Payer: Self-pay | Admitting: Internal Medicine

## 2023-11-11 ENCOUNTER — Ambulatory Visit: Payer: PPO | Admitting: Family Medicine

## 2023-11-11 DIAGNOSIS — Z95818 Presence of other cardiac implants and grafts: Secondary | ICD-10-CM

## 2023-11-11 DIAGNOSIS — I110 Hypertensive heart disease with heart failure: Secondary | ICD-10-CM | POA: Diagnosis present

## 2023-11-11 DIAGNOSIS — E1065 Type 1 diabetes mellitus with hyperglycemia: Secondary | ICD-10-CM | POA: Diagnosis not present

## 2023-11-11 DIAGNOSIS — S82892A Other fracture of left lower leg, initial encounter for closed fracture: Secondary | ICD-10-CM | POA: Diagnosis not present

## 2023-11-11 DIAGNOSIS — Z9981 Dependence on supplemental oxygen: Secondary | ICD-10-CM

## 2023-11-11 DIAGNOSIS — S82852A Displaced trimalleolar fracture of left lower leg, initial encounter for closed fracture: Secondary | ICD-10-CM | POA: Diagnosis not present

## 2023-11-11 DIAGNOSIS — F411 Generalized anxiety disorder: Secondary | ICD-10-CM | POA: Diagnosis present

## 2023-11-11 DIAGNOSIS — G894 Chronic pain syndrome: Secondary | ICD-10-CM | POA: Diagnosis present

## 2023-11-11 DIAGNOSIS — K219 Gastro-esophageal reflux disease without esophagitis: Secondary | ICD-10-CM | POA: Diagnosis present

## 2023-11-11 DIAGNOSIS — I340 Nonrheumatic mitral (valve) insufficiency: Secondary | ICD-10-CM | POA: Diagnosis present

## 2023-11-11 DIAGNOSIS — E7800 Pure hypercholesterolemia, unspecified: Secondary | ICD-10-CM

## 2023-11-11 DIAGNOSIS — Z7902 Long term (current) use of antithrombotics/antiplatelets: Secondary | ICD-10-CM

## 2023-11-11 DIAGNOSIS — I5032 Chronic diastolic (congestive) heart failure: Secondary | ICD-10-CM | POA: Diagnosis not present

## 2023-11-11 DIAGNOSIS — S9305XA Dislocation of left ankle joint, initial encounter: Secondary | ICD-10-CM | POA: Diagnosis present

## 2023-11-11 DIAGNOSIS — D631 Anemia in chronic kidney disease: Secondary | ICD-10-CM | POA: Diagnosis not present

## 2023-11-11 DIAGNOSIS — I509 Heart failure, unspecified: Secondary | ICD-10-CM | POA: Diagnosis not present

## 2023-11-11 DIAGNOSIS — E1142 Type 2 diabetes mellitus with diabetic polyneuropathy: Secondary | ICD-10-CM | POA: Diagnosis present

## 2023-11-11 DIAGNOSIS — G4733 Obstructive sleep apnea (adult) (pediatric): Secondary | ICD-10-CM | POA: Diagnosis present

## 2023-11-11 DIAGNOSIS — M1A9XX0 Chronic gout, unspecified, without tophus (tophi): Secondary | ICD-10-CM | POA: Diagnosis present

## 2023-11-11 DIAGNOSIS — Z6839 Body mass index (BMI) 39.0-39.9, adult: Secondary | ICD-10-CM

## 2023-11-11 DIAGNOSIS — Z01810 Encounter for preprocedural cardiovascular examination: Secondary | ICD-10-CM | POA: Diagnosis not present

## 2023-11-11 DIAGNOSIS — F32A Depression, unspecified: Secondary | ICD-10-CM | POA: Diagnosis present

## 2023-11-11 DIAGNOSIS — I5042 Chronic combined systolic (congestive) and diastolic (congestive) heart failure: Secondary | ICD-10-CM | POA: Diagnosis not present

## 2023-11-11 DIAGNOSIS — Z955 Presence of coronary angioplasty implant and graft: Secondary | ICD-10-CM

## 2023-11-11 DIAGNOSIS — M545 Low back pain, unspecified: Secondary | ICD-10-CM | POA: Diagnosis present

## 2023-11-11 DIAGNOSIS — W07XXXA Fall from chair, initial encounter: Secondary | ICD-10-CM | POA: Diagnosis present

## 2023-11-11 DIAGNOSIS — E66812 Obesity, class 2: Secondary | ICD-10-CM | POA: Diagnosis not present

## 2023-11-11 DIAGNOSIS — I480 Paroxysmal atrial fibrillation: Secondary | ICD-10-CM | POA: Diagnosis not present

## 2023-11-11 DIAGNOSIS — S82302A Unspecified fracture of lower end of left tibia, initial encounter for closed fracture: Secondary | ICD-10-CM | POA: Diagnosis not present

## 2023-11-11 DIAGNOSIS — N184 Chronic kidney disease, stage 4 (severe): Secondary | ICD-10-CM | POA: Diagnosis not present

## 2023-11-11 DIAGNOSIS — Z8616 Personal history of COVID-19: Secondary | ICD-10-CM

## 2023-11-11 DIAGNOSIS — S8252XA Displaced fracture of medial malleolus of left tibia, initial encounter for closed fracture: Secondary | ICD-10-CM | POA: Diagnosis not present

## 2023-11-11 DIAGNOSIS — I2510 Atherosclerotic heart disease of native coronary artery without angina pectoris: Secondary | ICD-10-CM | POA: Diagnosis present

## 2023-11-11 DIAGNOSIS — I252 Old myocardial infarction: Secondary | ICD-10-CM

## 2023-11-11 DIAGNOSIS — Z6836 Body mass index (BMI) 36.0-36.9, adult: Secondary | ICD-10-CM

## 2023-11-11 DIAGNOSIS — E1022 Type 1 diabetes mellitus with diabetic chronic kidney disease: Secondary | ICD-10-CM | POA: Diagnosis present

## 2023-11-11 DIAGNOSIS — E1042 Type 1 diabetes mellitus with diabetic polyneuropathy: Secondary | ICD-10-CM | POA: Diagnosis present

## 2023-11-11 DIAGNOSIS — I130 Hypertensive heart and chronic kidney disease with heart failure and stage 1 through stage 4 chronic kidney disease, or unspecified chronic kidney disease: Secondary | ICD-10-CM | POA: Diagnosis not present

## 2023-11-11 DIAGNOSIS — G931 Anoxic brain damage, not elsewhere classified: Secondary | ICD-10-CM | POA: Diagnosis not present

## 2023-11-11 DIAGNOSIS — I2583 Coronary atherosclerosis due to lipid rich plaque: Secondary | ICD-10-CM

## 2023-11-11 DIAGNOSIS — M069 Rheumatoid arthritis, unspecified: Secondary | ICD-10-CM | POA: Diagnosis present

## 2023-11-11 DIAGNOSIS — Z89412 Acquired absence of left great toe: Secondary | ICD-10-CM

## 2023-11-11 DIAGNOSIS — J9611 Chronic respiratory failure with hypoxia: Secondary | ICD-10-CM | POA: Diagnosis present

## 2023-11-11 DIAGNOSIS — J449 Chronic obstructive pulmonary disease, unspecified: Secondary | ICD-10-CM | POA: Diagnosis not present

## 2023-11-11 DIAGNOSIS — Z89419 Acquired absence of unspecified great toe: Secondary | ICD-10-CM | POA: Diagnosis not present

## 2023-11-11 DIAGNOSIS — E785 Hyperlipidemia, unspecified: Secondary | ICD-10-CM | POA: Diagnosis not present

## 2023-11-11 DIAGNOSIS — I25119 Atherosclerotic heart disease of native coronary artery with unspecified angina pectoris: Secondary | ICD-10-CM | POA: Diagnosis present

## 2023-11-11 DIAGNOSIS — Z9889 Other specified postprocedural states: Secondary | ICD-10-CM

## 2023-11-11 DIAGNOSIS — Z7982 Long term (current) use of aspirin: Secondary | ICD-10-CM

## 2023-11-11 DIAGNOSIS — Z90710 Acquired absence of both cervix and uterus: Secondary | ICD-10-CM

## 2023-11-11 DIAGNOSIS — E871 Hypo-osmolality and hyponatremia: Secondary | ICD-10-CM | POA: Diagnosis present

## 2023-11-11 DIAGNOSIS — Z79899 Other long term (current) drug therapy: Secondary | ICD-10-CM

## 2023-11-11 DIAGNOSIS — N189 Chronic kidney disease, unspecified: Secondary | ICD-10-CM | POA: Diagnosis present

## 2023-11-11 DIAGNOSIS — Z8679 Personal history of other diseases of the circulatory system: Secondary | ICD-10-CM

## 2023-11-11 DIAGNOSIS — I200 Unstable angina: Secondary | ICD-10-CM | POA: Diagnosis present

## 2023-11-11 DIAGNOSIS — E119 Type 2 diabetes mellitus without complications: Secondary | ICD-10-CM

## 2023-11-11 DIAGNOSIS — Z8249 Family history of ischemic heart disease and other diseases of the circulatory system: Secondary | ICD-10-CM

## 2023-11-11 DIAGNOSIS — D62 Acute posthemorrhagic anemia: Secondary | ICD-10-CM | POA: Diagnosis not present

## 2023-11-11 DIAGNOSIS — F331 Major depressive disorder, recurrent, moderate: Secondary | ICD-10-CM | POA: Diagnosis present

## 2023-11-11 DIAGNOSIS — M898X9 Other specified disorders of bone, unspecified site: Secondary | ICD-10-CM | POA: Diagnosis present

## 2023-11-11 DIAGNOSIS — Z794 Long term (current) use of insulin: Secondary | ICD-10-CM

## 2023-11-11 DIAGNOSIS — E782 Mixed hyperlipidemia: Secondary | ICD-10-CM | POA: Diagnosis not present

## 2023-11-11 DIAGNOSIS — M6702 Short Achilles tendon (acquired), left ankle: Secondary | ICD-10-CM | POA: Diagnosis not present

## 2023-11-11 DIAGNOSIS — Z886 Allergy status to analgesic agent status: Secondary | ICD-10-CM

## 2023-11-11 DIAGNOSIS — G8918 Other acute postprocedural pain: Secondary | ICD-10-CM | POA: Diagnosis not present

## 2023-11-11 DIAGNOSIS — E559 Vitamin D deficiency, unspecified: Secondary | ICD-10-CM | POA: Diagnosis present

## 2023-11-11 DIAGNOSIS — R627 Adult failure to thrive: Secondary | ICD-10-CM | POA: Diagnosis present

## 2023-11-11 DIAGNOSIS — E66813 Obesity, class 3: Secondary | ICD-10-CM | POA: Diagnosis present

## 2023-11-11 DIAGNOSIS — Z8674 Personal history of sudden cardiac arrest: Secondary | ICD-10-CM

## 2023-11-11 DIAGNOSIS — S82853A Displaced trimalleolar fracture of unspecified lower leg, initial encounter for closed fracture: Principal | ICD-10-CM | POA: Diagnosis present

## 2023-11-11 DIAGNOSIS — Z89422 Acquired absence of other left toe(s): Secondary | ICD-10-CM

## 2023-11-11 DIAGNOSIS — Z87891 Personal history of nicotine dependence: Secondary | ICD-10-CM

## 2023-11-11 DIAGNOSIS — Z8673 Personal history of transient ischemic attack (TIA), and cerebral infarction without residual deficits: Secondary | ICD-10-CM

## 2023-11-11 DIAGNOSIS — S82402A Unspecified fracture of shaft of left fibula, initial encounter for closed fracture: Secondary | ICD-10-CM | POA: Diagnosis not present

## 2023-11-11 HISTORY — DX: Other forms of acute ischemic heart disease: I24.89

## 2023-11-11 LAB — HIV ANTIBODY (ROUTINE TESTING W REFLEX): HIV Screen 4th Generation wRfx: NONREACTIVE

## 2023-11-11 LAB — CBC
HCT: 26.1 % — ABNORMAL LOW (ref 36.0–46.0)
Hemoglobin: 8.3 g/dL — ABNORMAL LOW (ref 12.0–15.0)
MCH: 26.9 pg (ref 26.0–34.0)
MCHC: 31.8 g/dL (ref 30.0–36.0)
MCV: 84.7 fL (ref 80.0–100.0)
Platelets: 340 10*3/uL (ref 150–400)
RBC: 3.08 MIL/uL — ABNORMAL LOW (ref 3.87–5.11)
RDW: 14.2 % (ref 11.5–15.5)
WBC: 15.3 10*3/uL — ABNORMAL HIGH (ref 4.0–10.5)
nRBC: 0 % (ref 0.0–0.2)

## 2023-11-11 LAB — COMPREHENSIVE METABOLIC PANEL WITH GFR
ALT: 8 U/L (ref 0–44)
AST: 9 U/L — ABNORMAL LOW (ref 15–41)
Albumin: 2.3 g/dL — ABNORMAL LOW (ref 3.5–5.0)
Alkaline Phosphatase: 98 U/L (ref 38–126)
Anion gap: 11 (ref 5–15)
BUN: 88 mg/dL — ABNORMAL HIGH (ref 6–20)
CO2: 24 mmol/L (ref 22–32)
Calcium: 8.7 mg/dL — ABNORMAL LOW (ref 8.9–10.3)
Chloride: 97 mmol/L — ABNORMAL LOW (ref 98–111)
Creatinine, Ser: 2.57 mg/dL — ABNORMAL HIGH (ref 0.44–1.00)
GFR, Estimated: 21 mL/min — ABNORMAL LOW (ref >60)
Glucose, Bld: 301 mg/dL — ABNORMAL HIGH (ref 70–99)
Potassium: 4.5 mmol/L (ref 3.5–5.1)
Sodium: 132 mmol/L — ABNORMAL LOW (ref 135–145)
Total Bilirubin: 0.8 mg/dL (ref 0.0–1.2)
Total Protein: 7.3 g/dL (ref 6.5–8.1)

## 2023-11-11 LAB — GLUCOSE, CAPILLARY
Glucose-Capillary: 318 mg/dL — ABNORMAL HIGH (ref 70–99)
Glucose-Capillary: 225 mg/dL — ABNORMAL HIGH (ref 70–99)

## 2023-11-11 LAB — MAGNESIUM: Magnesium: 1.8 mg/dL (ref 1.7–2.4)

## 2023-11-11 MED ORDER — CARVEDILOL 6.25 MG PO TABS
9.3750 mg | ORAL_TABLET | Freq: Two times a day (BID) | ORAL | Status: DC
  Administered 2023-11-11 – 2023-11-14 (×6): 9.375 mg via ORAL
  Filled 2023-11-11 (×6): qty 1

## 2023-11-11 MED ORDER — POLYETHYLENE GLYCOL 3350 17 G PO PACK: 17.0000 g | PACK | Freq: Every day | ORAL | Status: DC | PRN

## 2023-11-11 MED ORDER — INSULIN ASPART 100 UNIT/ML IJ SOLN
0.0000 [IU] | Freq: Three times a day (TID) | INTRAMUSCULAR | Status: DC
  Administered 2023-11-11: 11 [IU] via SUBCUTANEOUS
  Administered 2023-11-12: 5 [IU] via SUBCUTANEOUS
  Administered 2023-11-12: 3 [IU] via SUBCUTANEOUS
  Administered 2023-11-12 – 2023-11-13 (×2): 5 [IU] via SUBCUTANEOUS
  Administered 2023-11-14: 11 [IU] via SUBCUTANEOUS

## 2023-11-11 MED ORDER — TORSEMIDE 20 MG PO TABS
60.0000 mg | ORAL_TABLET | Freq: Every day | ORAL | Status: DC
  Administered 2023-11-12 – 2023-11-14 (×3): 60 mg via ORAL
  Filled 2023-11-11 (×3): qty 3

## 2023-11-11 MED ORDER — SODIUM CHLORIDE 0.9% FLUSH
3.0000 mL | Freq: Two times a day (BID) | INTRAVENOUS | Status: DC
  Administered 2023-11-11 – 2023-11-14 (×5): 3 mL via INTRAVENOUS

## 2023-11-11 MED ORDER — ACETAMINOPHEN 650 MG RE SUPP: 650.0000 mg | Freq: Four times a day (QID) | RECTAL | Status: DC | PRN

## 2023-11-11 MED ORDER — ACETAMINOPHEN 325 MG PO TABS
650.0000 mg | ORAL_TABLET | Freq: Four times a day (QID) | ORAL | Status: DC | PRN
  Administered 2023-11-12: 650 mg via ORAL
  Filled 2023-11-11: qty 2

## 2023-11-11 MED ORDER — INSULIN GLARGINE-YFGN 100 UNIT/ML ~~LOC~~ SOLN
25.0000 [IU] | Freq: Every day | SUBCUTANEOUS | Status: DC
  Filled 2023-11-11: qty 0.25

## 2023-11-11 MED ORDER — ISOSORBIDE MONONITRATE ER 30 MG PO TB24
30.0000 mg | ORAL_TABLET | Freq: Every day | ORAL | Status: DC
  Administered 2023-11-12 – 2023-11-14 (×3): 30 mg via ORAL
  Filled 2023-11-11 (×3): qty 1

## 2023-11-11 MED ORDER — DOXEPIN HCL 25 MG PO CAPS
150.0000 mg | ORAL_CAPSULE | Freq: Every day | ORAL | Status: DC
  Administered 2023-11-12 – 2023-11-13 (×2): 150 mg via ORAL
  Filled 2023-11-11: qty 6
  Filled 2023-11-11: qty 2
  Filled 2023-11-11 (×2): qty 6

## 2023-11-11 MED ORDER — EZETIMIBE 10 MG PO TABS
10.0000 mg | ORAL_TABLET | Freq: Every evening | ORAL | Status: DC
  Administered 2023-11-11 – 2023-11-13 (×3): 10 mg via ORAL
  Filled 2023-11-11 (×3): qty 1

## 2023-11-11 MED ORDER — HEPARIN SODIUM (PORCINE) 5000 UNIT/ML IJ SOLN
5000.0000 [IU] | Freq: Three times a day (TID) | INTRAMUSCULAR | Status: DC
  Administered 2023-11-11 – 2023-11-12 (×4): 5000 [IU] via SUBCUTANEOUS
  Filled 2023-11-11 (×4): qty 1

## 2023-11-11 MED ORDER — ALLOPURINOL 100 MG PO TABS
50.0000 mg | ORAL_TABLET | Freq: Every day | ORAL | Status: DC
  Administered 2023-11-12 – 2023-11-14 (×3): 50 mg via ORAL
  Filled 2023-11-11 (×3): qty 1

## 2023-11-11 MED ORDER — GABAPENTIN 300 MG PO CAPS
300.0000 mg | ORAL_CAPSULE | Freq: Three times a day (TID) | ORAL | Status: DC
Start: 2023-11-11 — End: 2023-11-14
  Administered 2023-11-11 – 2023-11-14 (×8): 300 mg via ORAL
  Filled 2023-11-11 (×8): qty 1

## 2023-11-11 MED ORDER — LATANOPROST 0.005 % OP SOLN
1.0000 [drp] | Freq: Every day | OPHTHALMIC | Status: DC
  Administered 2023-11-11 – 2023-11-13 (×3): 1 [drp] via OPHTHALMIC
  Filled 2023-11-11: qty 2.5

## 2023-11-11 MED ORDER — HYDROCODONE-ACETAMINOPHEN 7.5-325 MG PO TABS
1.0000 | ORAL_TABLET | Freq: Three times a day (TID) | ORAL | Status: DC | PRN
  Administered 2023-11-11 – 2023-11-12 (×4): 1 via ORAL
  Filled 2023-11-11 (×5): qty 1

## 2023-11-11 MED ORDER — ASPIRIN 81 MG PO TBEC
81.0000 mg | DELAYED_RELEASE_TABLET | Freq: Every day | ORAL | Status: DC
  Administered 2023-11-12 – 2023-11-14 (×2): 81 mg via ORAL
  Filled 2023-11-11 (×2): qty 1

## 2023-11-11 MED ORDER — LORAZEPAM 0.5 MG PO TABS
0.5000 mg | ORAL_TABLET | Freq: Every day | ORAL | Status: DC | PRN
  Administered 2023-11-11 – 2023-11-13 (×3): 0.5 mg via ORAL
  Filled 2023-11-11 (×3): qty 1

## 2023-11-11 MED ORDER — PANTOPRAZOLE SODIUM 40 MG PO TBEC
40.0000 mg | DELAYED_RELEASE_TABLET | Freq: Every day | ORAL | Status: DC
  Administered 2023-11-12 – 2023-11-14 (×3): 40 mg via ORAL
  Filled 2023-11-11 (×3): qty 1

## 2023-11-11 NOTE — H&P (Signed)
 History and Physical   IRERI [name] UJW:[id number] DOB: [birth year] DOA: 11/11/2023  PCP: Mercy Stall, MD   Patient coming from: Orthopedic office  Chief Complaint: Ankle fracture  HPI/course: Dana [name] is a 63 y.o. female with medical history significant of hypertension, hyperlipidemia, atrial fibrillation status post ablation, CAD, GERD, CKD 4, diabetes, neuropathy, chronic combined systolic and diastolic CHF, anxiety, depression, obesity, OSA, chronic respiratory failure with hypoxia, anemia, cardiac arrest, chronic pain, toe amputation, gout presenting following ankle fracture for surgical intervention.  Patient was initially seen in the ED on 4/22.  She had a fall when getting out of her recliner with a twisting motion.  She noted significant left ankle pain which only increased over time.  She presents to the ED to be evaluated and imaging there showed a trimalleolar fracture.  Discussed with orthopedic surgery and the fracture was reduced and splinted and then patient was discharged with orthopedic follow-up.  Since that time patient has had trouble with maintaining nonweightbearing.  After reevaluating in orthopedic office today decision was made to directly admit patient for surgical correction.    Patient reports some significant pain in her ankle.  Patient denies fevers, chills, chest pain, shortness of breath, abdominal pain, constipation, diarrhea, nausea, vomiting.  Vital signs on first check are stable.  Initial labs still pending.  Review of Systems: As per HPI otherwise all other systems reviewed and are negative.  Past Medical History:  Diagnosis Date   Acquired absence of left great toe (HCC)    Acquired absence of other left toe(s) (HCC)    ACS (acute coronary syndrome) (HCC)    Acute bronchitis due to COVID-19 virus 03/03/2023   Acute on chronic combined systolic and diastolic CHF (congestive heart failure) (HCC) 09/16/2023   Acute respiratory failure with  hypoxia (HCC) 07/20/2023   Anemia    Anoxic brain injury (HCC)    Anxiety    CAD (coronary artery disease)    DES mLAD 04/07/15; DES dRCA & DES pPDA 08/30/16; DES mCX & PTCA OM1 09/29/20   Chronic diastolic (congestive) heart failure (HCC)    Chronic pain    Chronic systolic (congestive) heart failure (HCC)    CKD (chronic kidney disease)    Contrast dye induced nephropathy, possible 09/03/2020   COPD (chronic obstructive pulmonary disease) (HCC)    COVID-19 virus infection 03/03/2023   Demand ischemia (HCC)    Diabetes (HCC)type 1    Diastolic CHF (HCC)    Dyspnea    Encounter for screening mammogram for malignant neoplasm of breast 09/16/2023   Family history of breast cancer 10/23/2021   Gastro-esophageal reflux disease without esophagitis    Hyperlipidemia    Hypertension    Hypertensive heart disease with heart failure (HCC)    Hypoxemia    Idiopathic gout, multiple sites    Leukocytosis 03/29/2022   Localized edema    Low back pain    Mitral regurgitation    Mitral regurgitation    Mixed hyperlipidemia    Mixed incontinence    Morbid (severe) obesity due to excess calories (HCC)    Neuromuscular disorder (HCC)    Neuropathy    Non-ST elevation (NSTEMI) myocardial infarction (HCC)    08/29/20, 09/29/20   OSA (obstructive sleep apnea) 09/03/2020   Other specified hypothyroidism    PAF (paroxysmal atrial fibrillation) (HCC)    s/p pulmonary vein isolation by cryoablation 10/04/15 Dr. Jearldine Mina in [facility]   PEA (Pulseless electrical activity) [facility])    ~ 01/13/21  at [facility] during admission DKA, volume overload, possible CAP, hypoxia s/p ACLS with ROSC ~ 10-15   Pneumonia    Primary insomnia    Rheumatoid arthritis (HCC)    Stroke (HCC)    Thyroid  disease    Type 1 diabetes mellitus (HCC)    URI with cough and congestion 08/22/2023   Vitamin D  deficiency     Past Surgical History:  Procedure Laterality Date   ABDOMINAL HYSTERECTOMY     Partial- Still has both  ovaries   CARDIOVASCULAR STRESS TEST  08/13/2014   Nuclear; Normal   CARPAL TUNNEL RELEASE     CATARACT EXTRACTION, BILATERAL     CESAREAN SECTION     CHOLECYSTECTOMY     CORONARY ANGIOPLASTY WITH STENT PLACEMENT     3 blockages/ 1 stent   CORONARY BALLOON ANGIOPLASTY N/A 07/18/2023   Procedure: CORONARY BALLOON ANGIOPLASTY;  Surgeon: Odie Benne, MD;  Location: [facility] INVASIVE CV LAB;  Service: Cardiovascular;  Laterality: N/A;   CORONARY PRESSURE/FFR STUDY N/A 09/07/2022   Procedure: INTRAVASCULAR PRESSURE WIRE/FFR STUDY;  Surgeon: Swaziland, Peter M, MD;  Location: [facility] INVASIVE CV LAB;  Service: Cardiovascular;  Laterality: N/A;   CORONARY STENT INTERVENTION N/A 09/29/2020   Procedure: CORONARY STENT INTERVENTION;  Surgeon: Arnoldo Lapping, MD;  Location: [facility] INVASIVE CV LAB;  Service: Cardiovascular;  Laterality: N/A;  CFX   CORONARY STENT INTERVENTION N/A 09/07/2022   Procedure: CORONARY STENT INTERVENTION;  Surgeon: Swaziland, Peter M, MD;  Location: [facility] INVASIVE CV LAB;  Service: Cardiovascular;  Laterality: N/A;   CORONARY STENT INTERVENTION N/A 02/13/2023   Procedure: CORONARY STENT INTERVENTION;  Surgeon: Odie Benne, MD;  Location: [facility] INVASIVE CV LAB;  Service: Cardiovascular;  Laterality: N/A;  LAD   CORONARY ULTRASOUND/IVUS N/A 02/13/2023   Procedure: Coronary Ultrasound/IVUS;  Surgeon: Odie Benne, MD;  Location: [facility] INVASIVE CV LAB;  Service: Cardiovascular;  Laterality: N/A;   CORONARY/GRAFT ACUTE MI REVASCULARIZATION N/A 12/08/2022   Procedure: Coronary/Graft Acute MI Revascularization;  Surgeon: Arleen Lacer, MD;  Location: [facility] INVASIVE CV LAB;  Service: Cardiovascular;  Laterality: N/A;   EYE SURGERY     LEFT HEART CATH AND CORONARY ANGIOGRAPHY N/A 09/29/2020   Procedure: LEFT HEART CATH AND CORONARY ANGIOGRAPHY;  Surgeon: Arnoldo Lapping, MD;  Location: [facility] INVASIVE CV LAB;  Service: Cardiovascular;  Laterality: N/A;   LEFT HEART CATH AND CORONARY  ANGIOGRAPHY N/A 09/07/2022   Procedure: LEFT HEART CATH AND CORONARY ANGIOGRAPHY;  Surgeon: Swaziland, Peter M, MD;  Location: [facility] INVASIVE CV LAB;  Service: Cardiovascular;  Laterality: N/A;   LEFT HEART CATH AND CORONARY ANGIOGRAPHY N/A 12/08/2022   Procedure: LEFT HEART CATH AND CORONARY ANGIOGRAPHY;  Surgeon: Arleen Lacer, MD;  Location: [facility] INVASIVE CV LAB;  Service: Cardiovascular;  Laterality: N/A;   LEFT HEART CATH AND CORONARY ANGIOGRAPHY N/A 02/13/2023   Procedure: LEFT HEART CATH AND CORONARY ANGIOGRAPHY;  Surgeon: Odie Benne, MD;  Location: [facility] INVASIVE CV LAB;  Service: Cardiovascular;  Laterality: N/A;   LEFT HEART CATH AND CORONARY ANGIOGRAPHY N/A 07/16/2023   Procedure: LEFT HEART CATH AND CORONARY ANGIOGRAPHY;  Surgeon: Odie Benne, MD;  Location: [facility] INVASIVE CV LAB;  Service: Cardiovascular;  Laterality: N/A;   RIGHT HEART CATH N/A 04/27/2021   Procedure: RIGHT HEART CATH;  Surgeon: Mardell Shade, MD;  Location: [facility] INVASIVE CV LAB;  Service: Cardiovascular;  Laterality: N/A;   RIGHT/LEFT HEART CATH AND CORONARY ANGIOGRAPHY N/A 01/07/2017   Procedure: Right/Left Heart Cath and Coronary  Angiography;  Surgeon: Darlis Eisenmenger, MD;  Location: [facility] INVASIVE CV LAB;  Service: Cardiovascular;  Laterality: N/A;   ROTATOR CUFF REPAIR     TEE WITHOUT CARDIOVERSION N/A 04/28/2021   Procedure: TRANSESOPHAGEAL ECHOCARDIOGRAM (TEE);  Surgeon: Mardell Shade, MD;  Location: [facility] ENDOSCOPY;  Service: Cardiovascular;  Laterality: N/A;   TEE WITHOUT CARDIOVERSION N/A 05/18/2021   Procedure: TRANSESOPHAGEAL ECHOCARDIOGRAM (TEE);  Surgeon: Thukkani, Arun K, MD;  Location: [facility] INVASIVE CV LAB;  Service: Cardiovascular;  Laterality: N/A;   TOE AMPUTATION Left    TRANSCATHETER MITRAL EDGE TO EDGE REPAIR N/A 05/18/2021   Procedure: MITRAL VALVE REPAIR;  Surgeon: Kyra Phy, MD;  Location: [facility] INVASIVE CV LAB;  Service: Cardiovascular;  Laterality: N/A;   US   ECHOCARDIOGRAPHY  05/2017   Normal    Social History  reports that she quit smoking about 41 years ago. Her smoking use included cigarettes. She started smoking about 42 years ago. She has been exposed to tobacco smoke. She has never used smokeless tobacco. She reports that she does not drink alcohol and does not use drugs.  Allergies  Allergen Reactions   Naproxen Hives and Rash   Shellfish-Derived Products Hives, Swelling and Other (See Comments)    Angioedema    Strawberry (Diagnostic) Hives   Celecoxib Other (See Comments)    Stomach pain   Glucosamine Hives, Swelling and Other (See Comments)    Angioedema    Iron  Hives and Other (See Comments)    IV iron  transfusion - hives - Feb 2022 hospitalization  Patient said she CAN tolerate if given Benadryl  first   Metoclopramide Other (See Comments)    Facial twitching and stuttering   Metolazone  Other (See Comments)    Acute renal failure   Statins Other (See Comments)    Muscle pain    Family History  Problem Relation Age of Onset   Breast cancer Mother 79       contralateral breast ca dx 55   Coronary artery disease Mother    Hypertension Mother    Hyperlipidemia Mother    Diabetes type II Mother    Lung cancer Mother 50   Diabetes Father    Hypertension Father    Mental illness Sister    Bipolar disorder Other    Depression Other    Schizophrenia Other    Cancer Other        MGF's sisters x2; unknown type  Reviewed on admission  Prior to Admission medications   Medication Sig Start Date End Date Taking? Authorizing Provider  acetaminophen  (TYLENOL ) 500 MG tablet Take 1,000 mg by mouth every 6 (six) hours as needed for mild pain (pain score 1-3) or headache.    [provider]  allopurinol  (ZYLOPRIM ) 100 MG tablet Take 0.5 tablets (50 mg total) by mouth daily. 04/29/23   Mercy Stall, MD  aspirin  EC 81 MG tablet Take 1 tablet (81 mg total) by mouth daily. Swallow whole. 08/13/23   CoxBurleigh Carp, MD   carvedilol  (COREG ) 6.25 MG tablet Take 1.5 tablets (9.375 mg total) by mouth 2 (two) times daily. 08/05/23   Sheryl Donna, NP  cefUROXime (CEFTIN) 500 MG tablet Take 500 mg by mouth 2 (two) times daily. 10/27/23   [provider]  Continuous Glucose Sensor (FREESTYLE LIBRE 2 SENSOR) MISC Inject 1 device into skin every 14 (fourteen) days. 08/14/23   CoxBurleigh Carp, MD  doxepin  (SINEQUAN ) 150 MG capsule Take 1 capsule (150 mg total) by mouth at bedtime. 11/01/23  Odilia Bennett, PA  EPINEPHrine  0.3 mg/0.3 mL IJ SOAJ injection Use as directed for life-threatening allergic reaction. 04/29/23   Cox, Burleigh Carp, MD  Evolocumab  (REPATHA  SURECLICK) 140 MG/ML SOAJ Inject 140 mg into the skin every 14 (fourteen) days. 04/29/23   CoxBurleigh Carp, MD  ezetimibe  (ZETIA ) 10 MG tablet Take 1 tablet (10 mg total) by mouth every evening. 04/29/23   CoxBurleigh Carp, MD  gabapentin  (NEURONTIN ) 300 MG capsule Take 1 capsule (300 mg total) by mouth 3 (three) times daily. 11/01/23   Odilia Bennett, PA  Glucagon  (GVOKE HYPOPEN  2-PACK) 0.5 MG/0.1ML SOAJ Inject 0.5 mg into the skin daily as needed. 07/12/23   CoxBurleigh Carp, MD  HYDROcodone -acetaminophen  (NORCO) 7.5-325 MG tablet Take 1 tablet by mouth 3 (three) times daily as needed. 10/16/23   Cyndi Drain, PA-C  insulin  aspart (NOVOLOG  FLEXPEN) 100 UNIT/ML FlexPen Inject 2 - 10 Units before meals based on SSI. 04/29/23   Cox, Kirsten, MD  insulin  glargine (LANTUS  SOLOSTAR) 100 UNIT/ML Solostar Pen Inject 33-35 Units into the skin daily. 08/29/23   Deforest Fast, MD  Insulin  Pen Needle (BD PEN NEEDLE NANO 2ND GEN) 32G X 4 MM MISC Inject 1 each into the skin in the morning, at noon, in the evening, and at bedtime. 08/30/21   CoxBurleigh Carp, MD  isosorbide  mononitrate (IMDUR ) 30 MG 24 hr tablet Take 1 tablet (30 mg total) by mouth daily. 08/05/23   Sheryl Donna, NP  latanoprost  (XALATAN ) 0.005 % ophthalmic solution Place 1 drop into both eyes at bedtime. 08/14/23   Mercy Stall, MD   linaclotide  (LINZESS ) 145 MCG CAPS capsule Take 1 capsule (145 mcg total) by mouth daily before breakfast. 04/29/23   Cox, Burleigh Carp, MD  LORazepam  (ATIVAN ) 0.5 MG tablet Take 1 tablet (0.5 mg total) by mouth daily as needed for anxiety. 04/29/23   CoxBurleigh Carp, MD  magnesium  oxide (MAG-OX) 400 MG tablet Take 2 tablets (800 mg total) by mouth daily. Take 2 (400 mg) daily=800 mg 04/29/23   Cox, Kirsten, MD  Menthol -Methyl Salicylate  (ICY HOT EXTRA STRENGTH) 10-30 % CREA Apply 1 Application topically as needed (bilateral hips and knees). 10/16/23   Cyndi Drain, PA-C  nitroGLYCERIN  (NITROSTAT ) 0.4 MG SL tablet DISSOLVE ONE TABLET UNDER THE TONGUE EVERY 5 MINUTES AS NEEDED FOR CHEST PAIN.  DO NOT EXCEED A TOTAL OF 3 DOSES IN 15 MINUTES 04/29/23   Cox, Kirsten, MD  Omega-3 Fatty Acids (FISH OIL PO) Take 1 capsule by mouth daily.    [provider]  orphenadrine  (NORFLEX ) 100 MG tablet Take 1 tablet (100 mg total) by mouth 2 (two) times daily. 11/01/23   Odilia Bennett, PA  oxyCODONE  (ROXICODONE ) 5 MG immediate release tablet Take 1 tablet (5 mg total) by mouth every 4 (four) hours as needed for severe pain (pain score 7-10). 11/05/23   Roemhildt, Lorin T, PA-C  OXYGEN  Inhale 2 L/min into the lungs at bedtime.    [provider]  pantoprazole  (PROTONIX ) 40 MG tablet Take 1 tablet (40 mg total) by mouth daily. 04/29/23   CoxBurleigh Carp, MD  potassium chloride  SA (KLOR-CON  M) 20 MEQ tablet Take 1 tablet (20 mEq total) by mouth daily. 08/30/23   Deforest Fast, MD  Probiotic CAPS Take 1 capsule by mouth daily.    [provider]  promethazine  (PHENERGAN ) 25 MG tablet Take 1 tablet (25 mg total) by mouth daily as needed. 04/29/23   CoxBurleigh Carp, MD  ticagrelor  (BRILINTA ) 90 MG TABS tablet Take 1 tablet (90  mg total) by mouth 2 (two) times daily. 04/29/23   CoxBurleigh Carp, MD  torsemide  (DEMADEX ) 20 MG tablet Take 3 tablets (60 mg total) by mouth daily. 10/17/23   Bensimhon, Rheta Celestine, MD     Physical Exam: Vitals:   11/11/23 1417  BP: (!) 124/51  Pulse: 73  Temp: 98.1 F (36.7 C)  TempSrc: Oral  SpO2: 91%    Physical Exam Constitutional:      General: She is not in acute distress.    Appearance: Normal appearance. She is obese.  HENT:     Head: Normocephalic and atraumatic.     Mouth/Throat:     Mouth: Mucous membranes are moist.     Pharynx: Oropharynx is clear.  Eyes:     Extraocular Movements: Extraocular movements intact.     Pupils: Pupils are equal, round, and reactive to light.  Cardiovascular:     Rate and Rhythm: Normal rate and regular rhythm.     Pulses: Normal pulses.     Heart sounds: Normal heart sounds.  Pulmonary:     Effort: Pulmonary effort is normal. No respiratory distress.     Breath sounds: Normal breath sounds.  Abdominal:     General: Bowel sounds are normal. There is no distension.     Palpations: Abdomen is soft.     Tenderness: There is no abdominal tenderness.  Musculoskeletal:        General: No swelling or deformity.     Comments: Status post L great toe amputation  Left lower extremity with splint in place  Skin:    General: Skin is warm and dry.  Neurological:     General: No focal deficit present.     Mental Status: Mental status is at baseline.    Labs on Admission: I have personally reviewed following labs and imaging studies  CBC: No results for input(s): "WBC", "NEUTROABS", "HGB", "HCT", "MCV", "PLT" in the last 168 hours.  Basic Metabolic Panel: No results for input(s): "NA", "K", "CL", "CO2", "GLUCOSE", "BUN", "CREATININE", "CALCIUM ", "MG", "PHOS" in the last 168 hours.  GFR: CrCl cannot be calculated (Patient's most recent lab result is older than the maximum 21 days allowed.).  Liver Function Tests: No results for input(s): "AST", "ALT", "ALKPHOS", "BILITOT", "PROT", "ALBUMIN " in the last 168 hours.  Urine analysis:    Component Value Date/Time   COLORURINE YELLOW 08/27/2023 0423   APPEARANCEUR  HAZY (A) 08/27/2023 0423   LABSPEC 1.012 08/27/2023 0423   PHURINE 5.0 08/27/2023 0423   GLUCOSEU 50 (A) 08/27/2023 0423   HGBUR SMALL (A) 08/27/2023 0423   BILIRUBINUR negative 10/22/2023 0916   BILIRUBINUR negative 07/11/2021 0812   KETONESUR negative 10/22/2023 0916   KETONESUR NEGATIVE 08/27/2023 0423   PROTEINUR =100 (A) 10/22/2023 0916   PROTEINUR >=300 (A) 08/27/2023 0423   UROBILINOGEN 0.2 10/22/2023 0916   NITRITE Negative 10/22/2023 0916   NITRITE NEGATIVE 08/27/2023 0423   LEUKOCYTESUR Negative 10/22/2023 0916   LEUKOCYTESUR NEGATIVE 08/27/2023 0423    Radiological Exams on Admission: No results found.  EKG: Not yet performed   Assessment/Plan Principal Problem:   Trimalleolar fracture of ankle, closed Active Problems:   Anemia in CKD (chronic kidney disease)   Chronic pain syndrome   Diabetic polyneuropathy associated with type 2 diabetes mellitus (HCC)   GERD without esophagitis   Hyperlipidemia LDL goal <70   S/P ablation of atrial fibrillation   Class 2 severe obesity due to excess calories with serious comorbidity and body mass index (BMI)  of 39.0 to 39.9 in adult [facility])   Hypertensive heart and renal disease with congestive heart failure (HCC)   Lumbar back pain   Chronic respiratory failure with hypoxia (HCC)   Chronic gout without tophus   OSA on CPAP   Insulin  dependent type 2 diabetes mellitus (HCC)   S/P mitral valve clip implantation   GAD (generalized anxiety disorder)   Mixed hyperlipidemia   Unstable angina (HCC)   CKD stage 4 due to type 1 diabetes mellitus (HCC)   History of amputation of hallux (HCC)   Moderate recurrent major depression (HCC)   Coronary artery disease involving native coronary artery of native heart with angina pectoris (HCC)   Trimalleolar ankle fracture > Patient presenting from orthopedic office for surgical correction of left trimalleolar ankle fracture. > Initially presented to the ED on 4/22 after a fall getting  out of her recliner with a twisting motion.  Found to have trimalleolar ankle fracture on x-ray and CT imaging. > Ankle was reduced and splinted and patient was sent for follow-up with orthopedics. > On follow-up today; decision made to admit directly for operative repair this week. - Monitoring on MedSurg unit for now - Appreciate orthopedic surgery recommendations and assistance - Nonweightbearing left lower extremity - Pain control as needed - Consulting with cardiology as below about patient's Brilinta   CAD > Significant CAD history with multiple stenting's and in the last year has had her LAD restented and has had in-stent stenosis which required angioplasty. > Remains on aspirin  and Brilinta .  Will continue with aspirin  and consult with cardiology about plan for potentially holding Brilinta . - Continue home aspirin  - Hold off on using Brilinta  pending cardiology input - Continue home Imdur , carvedilol  - Continue home Zetia  - Patient on Repatha  outpatient  Chronic combined systolic and diastolic CHF Status post MitraClip > Last echo was in February of this year with EF 40 to 45%, indeterminate diastolic function, poorly visualized RV function.  Status post MitraClip.  Some wall motion abnormality was noted. > Certainly there is a ischemic component to this given the above. > Follows with advanced heart failure clinic - Continue home torsemide , carvedilol , Imdur   Hypertension - Continue home torsemide , carvedilol , Imdur   Hyperlipidemia - Continue Zetia  - On Repatha  outpatient  Atrial fibrillation > Status post ablation - Not currently on anticoagulation - Is taking Coreg  for comorbid conditions  GERD - Continue PPI  CKD 4 - Trend renal function and electrolytes  Diabetes > On 33-35 units long-acting outpatient and low-dose with meals. - 25 units long-acting daily - SSI  Neuropathy - Continue home gabapentin   Anxiety Depression - Continue home doxepin  and as  needed Ativan   Obesity - Noted  OSA - Continue CPAP  Chronic respiratory failure with hypoxia - On chronic 2 L  Anemia - Trend CBC  Gout - Continue home allopurinol   History of cardiac arrest in 2022 History of toe amputation - Noted   DVT prophylaxis: Heparin  Code Status:   Full Family Communication:  None on admission  Disposition Plan:   Patient is from:  Home  Anticipated DC to:  Pending clinical course  Anticipated DC date:  4 to 10 days  Anticipated DC barriers: None  Consults called:  Orthopedic surgery, cardiology Admission status:  Inpatient, MedSurg  Severity of Illness: The appropriate patient status for this patient is INPATIENT. Inpatient status is judged to be reasonable and necessary in order to provide the required intensity of service to ensure the patient's safety. The  patient's presenting symptoms, physical exam findings, and initial radiographic and laboratory data in the context of their chronic comorbidities is felt to place them at high risk for further clinical deterioration. Furthermore, it is not anticipated that the patient will be medically stable for discharge from the hospital within 2 midnights of admission.   * I certify that at the point of admission it is my clinical judgment that the patient will require inpatient hospital care spanning beyond 2 midnights from the point of admission due to high intensity of service, high risk for further deterioration and high frequency of surveillance required.Johnetta Nab MD [facility]  How to contact the [facility] Attending or Consulting provider 7A - 7P or covering provider during after hours 7P -7A, for this patient?   Check the care team in [facility] and look for a) attending/consulting [facility] provider listed and b) the [facility] team listed Log into [email or web address] and use [place]'s universal password to access. If you do not have the password, please contact the hospital operator. Locate the [facility]  provider you are looking for under [facility] and page to a number that you can be directly reached. If you still have difficulty reaching the provider, please page the [facility] (Director on Call) for the Hospitalists listed on amion for assistance.  11/11/2023, 2:59 PM

## 2023-11-11 NOTE — Plan of Care (Signed)

## 2023-11-11 NOTE — Consult Note (Signed)
 Cardiology Consultation   Patient ID: Dana [name] MRN: [id number]; DOB: [birth year]  Admit date: 11/11/2023 Date of Consult: 11/11/2023  PCP:  Dana Stall, MD   [facility] Providers Cardiologist:  Dana Oar, MD   Patient Profile:   Dana [name] is a 63 y.o. female with a hx of CAD, prior mitraclip, chronic diastolic heart failure s/p CardioMEMS, hypertension, hyperlipidemia, OSA on CPAP, PAF s/p ablation no longer on [facility],  PAD s/p left great toe and fourth toe amputation, CKD, DM who is being seen 11/11/2023 for the evaluation of preoperative risk evaluation prior to ankle surgery at the request of Dr. Rufina [name].  History of Present Illness:   Dana [name] has known CAD.  With right and left heart catheterization 12/2016 that showed patent LAD stent, patent PDA stent, nonobstructive disease otherwise.  Admitting echocardiogram 06/2017 showed LVEF 605% with mild LVH and no RWMA.  He follows with advanced heart failure and had CardioMEMS pressure sensor implanted 02/2018.  Follow-up echocardiogram in 2022 showed grade 2 DD and continued preserved LVEF.  Unfortunately he had moderate MR.  He was admitted with an NSTEMI 09/2020 with LHC that showed 3v diease including LCx stenosis successfully treated with PCI and stent placement with exertion.  She was admitted at [facility] 12/2020 with DKA and unfortunately developed pulmonary vascular congestion and suffered a PEA arrest.  ROSC was achieved after 10 to 15 minutes of CPR.  PEA arrest felt likely respiratory.  Echocardiogram with preserved LVEF and mild MR/TR.  There was concern for stroke due to left-sided weakness and brain MRI confirmed anoxic brain injury but no acute stroke.  MR progressed to severe MR and she underwent mitraclip 05/18/21. Unfortunately clip was entangled and had to be deployed in to the lateral part of the valve, no additional clip placement could be performed. Echo 05/2021 with residual moderate MR.    Heart cath 08/2022 in the setting of chest pain resulted in DES-proximal LAD that overlapped previously placed stent.  She was readmitted 11/2022 with NSTEMI.  Repeat LHC with severe multivessel disease and 90% in-stent restenosis in the proximal midportion of the previously placed LAD stent.  This was successfully treated with scoring balloon angioplasty.  Lhc 01/2023 with severe ISR to proximal LAD stent successfully treated with IVUS guided DES-1 to LAD. CTO of LCX beyond the stented segment. DAPT with ASA and brilinta . Heart cath 06/2023 re-demonstrated severe restenosis of the proximal LAD stent. Staged intervention 07/18/23 to LAD stent with scoring balloon angioplasty.   She was last seen in advanced heart failure clinic on 10/15/2023 and was doing well at that time.  Last echocardiogram 07/2022 with preserved EF, normal RV, moderate MR.  Essentially unchanged from prior echocardiograms.    She presented with ankle fracture recommended for surgical intervention. She fell while getting out of her recliner in a twisting motion. She presented after this fall on 11/05/23. Orthopedic surgery was consulted and reduced and  splinted her ankle. She was discharged with orthopedic follow up. She was evaluated in the ortho office today. She has had trouble maintaining nonweightbearing status and had dislocated her ankle. This was reduced in the office today and she was directly admitted to [facility] for surgical intervention.   Cardiology was consulted for preoperative risk evaluation.  She has been cleaning out her room because her mother moved in with her and twisted and apparently broke her ankle but was not aware.  A few days later she was at her daughter's  house in [place] and twisted again falling to the ground and heard a popping noise in her ankle.  EMS dispatched and she was brought to [facility] ED 11/05/2023, as above.  She is sedentary at baseline and uses a scooter at the grocery store due to low back pain and  shortness of breath.  She takes a standing diuretic.  She reports a prior history of stroke.  She is on insulin  for diabetes.  Since her last intervention in January 2025 she has been chest pain-free.  Her dyspnea on exertion is stable, no orthopnea.  Her lower extremity swelling is at baseline.   Past Medical History:  Diagnosis Date   Acquired absence of left great toe (HCC)    Acquired absence of other left toe(s) (HCC)    ACS (acute coronary syndrome) (HCC)    Acute bronchitis due to COVID-19 virus 03/03/2023   Acute on chronic combined systolic and diastolic CHF (congestive heart failure) (HCC) 09/16/2023   Acute respiratory failure with hypoxia (HCC) 07/20/2023   Anemia    Anoxic brain injury (HCC)    Anxiety    CAD (coronary artery disease)    DES mLAD 04/07/15; DES dRCA & DES pPDA 08/30/16; DES mCX & PTCA OM1 09/29/20   Chronic diastolic (congestive) heart failure (HCC)    Chronic pain    Chronic systolic (congestive) heart failure (HCC)    CKD (chronic kidney disease)    Contrast dye induced nephropathy, possible 09/03/2020   COPD (chronic obstructive pulmonary disease) (HCC)    COVID-19 virus infection 03/03/2023   Demand ischemia (HCC)    Diabetes (HCC)type 1    Diastolic CHF (HCC)    Dyspnea    Encounter for screening mammogram for malignant neoplasm of breast 09/16/2023   Family history of breast cancer 10/23/2021   Gastro-esophageal reflux disease without esophagitis    Hyperlipidemia    Hypertension    Hypertensive heart disease with heart failure (HCC)    Hypoxemia    Idiopathic gout, multiple sites    Leukocytosis 03/29/2022   Localized edema    Low back pain    Mitral regurgitation    Mitral regurgitation    Mixed hyperlipidemia    Mixed incontinence    Morbid (severe) obesity due to excess calories (HCC)    Neuromuscular disorder (HCC)    Neuropathy    Non-ST elevation (NSTEMI) myocardial infarction (HCC)    08/29/20, 09/29/20   OSA (obstructive sleep  apnea) 09/03/2020   Other specified hypothyroidism    PAF (paroxysmal atrial fibrillation) (HCC)    s/p pulmonary vein isolation by cryoablation 10/04/15 Dr. Jearldine Mina in [facility]   PEA (Pulseless electrical activity) [facility])    ~ 01/13/21 at [facility] during admission DKA, volume overload, possible CAP, hypoxia s/p ACLS with ROSC ~ 10-15   Pneumonia    Primary insomnia    Rheumatoid arthritis (HCC)    Stroke (HCC)    Thyroid  disease    Type 1 diabetes mellitus (HCC)    URI with [name] and congestion 08/22/2023   Vitamin D  deficiency     Past Surgical History:  Procedure Laterality Date   ABDOMINAL HYSTERECTOMY     Partial- Still has both ovaries   CARDIOVASCULAR STRESS TEST  08/13/2014   Nuclear; Normal   CARPAL TUNNEL RELEASE     CATARACT EXTRACTION, BILATERAL     CESAREAN SECTION     CHOLECYSTECTOMY     CORONARY ANGIOPLASTY WITH STENT PLACEMENT     3 blockages/ 1  stent   CORONARY BALLOON ANGIOPLASTY N/A 07/18/2023   Procedure: CORONARY BALLOON ANGIOPLASTY;  Surgeon: Odie Benne, MD;  [facility] INVASIVE CV LAB;  Service: Cardiovascular;  Laterality: N/A;   CORONARY PRESSURE/FFR STUDY N/A 09/07/2022   Procedure: INTRAVASCULAR PRESSURE WIRE/FFR STUDY;  Surgeon: Swaziland, Peter M, MD;  Location: [facility] INVASIVE CV LAB;  Service: Cardiovascular;  Laterality: N/A;   CORONARY STENT INTERVENTION N/A 09/29/2020   Procedure: CORONARY STENT INTERVENTION;  Surgeon: Arnoldo Lapping, MD;  Location: [facility] INVASIVE CV LAB;  Service: Cardiovascular;  Laterality: N/A;  CFX   CORONARY STENT INTERVENTION N/A 09/07/2022   Procedure: CORONARY STENT INTERVENTION;  Surgeon: Swaziland, Peter M, MD;  Location: [facility] INVASIVE CV LAB;  Service: Cardiovascular;  Laterality: N/A;   CORONARY STENT INTERVENTION N/A 02/13/2023   Procedure: CORONARY STENT INTERVENTION;  Surgeon: Odie Benne, MD;  Location: [facility] INVASIVE CV LAB;  Service: Cardiovascular;  Laterality: N/A;  LAD   CORONARY  ULTRASOUND/IVUS N/A 02/13/2023   Procedure: Coronary Ultrasound/IVUS;  Surgeon: Odie Benne, MD;  Location: [facility] INVASIVE CV LAB;  Service: Cardiovascular;  Laterality: N/A;   CORONARY/GRAFT ACUTE MI REVASCULARIZATION N/A 12/08/2022   Procedure: Coronary/Graft Acute MI Revascularization;  Surgeon: Arleen Lacer, MD;  Location: [facility] INVASIVE CV LAB;  Service: Cardiovascular;  Laterality: N/A;   EYE SURGERY     LEFT HEART CATH AND CORONARY ANGIOGRAPHY N/A 09/29/2020   Procedure: LEFT HEART CATH AND CORONARY ANGIOGRAPHY;  Surgeon: Arnoldo Lapping, MD;  Location: [facility] INVASIVE CV LAB;  Service: Cardiovascular;  Laterality: N/A;   LEFT HEART CATH AND CORONARY ANGIOGRAPHY N/A 09/07/2022   Procedure: LEFT HEART CATH AND CORONARY ANGIOGRAPHY;  Surgeon: Swaziland, Peter M, MD;  Location: [facility] INVASIVE CV LAB;  Service: Cardiovascular;  Laterality: N/A;   LEFT HEART CATH AND CORONARY ANGIOGRAPHY N/A 12/08/2022   Procedure: LEFT HEART CATH AND CORONARY ANGIOGRAPHY;  Surgeon: Arleen Lacer, MD;  Location: [facility] INVASIVE CV LAB;  Service: Cardiovascular;  Laterality: N/A;   LEFT HEART CATH AND CORONARY ANGIOGRAPHY N/A 02/13/2023   Procedure: LEFT HEART CATH AND CORONARY ANGIOGRAPHY;  Surgeon: Odie Benne, MD;  Location: [facility] INVASIVE CV LAB;  Service: Cardiovascular;  Laterality: N/A;   LEFT HEART CATH AND CORONARY ANGIOGRAPHY N/A 07/16/2023   Procedure: LEFT HEART CATH AND CORONARY ANGIOGRAPHY;  Surgeon: Odie Benne, MD;  Location: [facility] INVASIVE CV LAB;  Service: Cardiovascular;  Laterality: N/A;   RIGHT HEART CATH N/A 04/27/2021   Procedure: RIGHT HEART CATH;  Surgeon: Mardell Shade, MD;  Location: [facility] INVASIVE CV LAB;  Service: Cardiovascular;  Laterality: N/A;   RIGHT/LEFT HEART CATH AND CORONARY ANGIOGRAPHY N/A 01/07/2017   Procedure: Right/Left Heart Cath and Coronary Angiography;  Surgeon: Darlis Eisenmenger, MD;  Location: [facility] INVASIVE CV LAB;  Service: Cardiovascular;   Laterality: N/A;   ROTATOR CUFF REPAIR     TEE WITHOUT CARDIOVERSION N/A 04/28/2021   Procedure: TRANSESOPHAGEAL ECHOCARDIOGRAM (TEE);  Surgeon: Mardell Shade, MD;  Location: [facility] ENDOSCOPY;  Service: Cardiovascular;  Laterality: N/A;   TEE WITHOUT CARDIOVERSION N/A 05/18/2021   Procedure: TRANSESOPHAGEAL ECHOCARDIOGRAM (TEE);  Surgeon: Thukkani, Arun K, MD;  Location: [facility] INVASIVE CV LAB;  Service: Cardiovascular;  Laterality: N/A;   TOE AMPUTATION Left    TRANSCATHETER MITRAL EDGE TO EDGE REPAIR N/A 05/18/2021   Procedure: MITRAL VALVE REPAIR;  Surgeon: Kyra Phy, MD;  Location: [facility] INVASIVE CV LAB;  Service: Cardiovascular;  Laterality: N/A;   US  ECHOCARDIOGRAPHY  05/2017   Normal  Home Medications:  Prior to Admission medications   Medication Sig Start Date End Date Taking? Authorizing Provider  acetaminophen  (TYLENOL ) 500 MG tablet Take 1,000 mg by mouth every 6 (six) hours as needed for mild pain (pain score 1-3) or headache.    [provider]  allopurinol  (ZYLOPRIM ) 100 MG tablet Take 0.5 tablets (50 mg total) by mouth daily. 04/29/23   Dana Stall, MD  aspirin  EC 81 MG tablet Take 1 tablet (81 mg total) by mouth daily. Swallow whole. 08/13/23   CoxBurleigh Carp, MD  carvedilol  (COREG ) 6.25 MG tablet Take 1.5 tablets (9.375 mg total) by mouth 2 (two) times daily. 08/05/23   Sheryl Donna, NP  cefUROXime (CEFTIN) 500 MG tablet Take 500 mg by mouth 2 (two) times daily. 10/27/23   [provider]  Continuous Glucose Sensor (FREESTYLE LIBRE 2 SENSOR) MISC Inject 1 device into skin every 14 (fourteen) days. 08/14/23   CoxBurleigh Carp, MD  doxepin  (SINEQUAN ) 150 MG capsule Take 1 capsule (150 mg total) by mouth at bedtime. 11/01/23   Odilia Bennett, PA  EPINEPHrine  0.3 mg/0.3 mL IJ SOAJ injection Use as directed for life-threatening allergic reaction. 04/29/23   Cox, Burleigh Carp, MD  Evolocumab  (REPATHA  SURECLICK) 140 MG/ML SOAJ Inject 140 mg into the skin every 14 (fourteen)  days. 04/29/23   CoxBurleigh Carp, MD  ezetimibe  (ZETIA ) 10 MG tablet Take 1 tablet (10 mg total) by mouth every evening. 04/29/23   CoxBurleigh Carp, MD  gabapentin  (NEURONTIN ) 300 MG capsule Take 1 capsule (300 mg total) by mouth 3 (three) times daily. 11/01/23   Odilia Bennett, PA  Glucagon  (GVOKE HYPOPEN  2-PACK) 0.5 MG/0.1ML SOAJ Inject 0.5 mg into the skin daily as needed. 07/12/23   CoxBurleigh Carp, MD  HYDROcodone -acetaminophen  (NORCO) 7.5-325 MG tablet Take 1 tablet by mouth 3 (three) times daily as needed. 10/16/23   Cyndi Drain, PA-C  insulin  aspart (NOVOLOG  FLEXPEN) 100 UNIT/ML FlexPen Inject 2 - 10 Units before meals based on SSI. 04/29/23   Cox, Kirsten, MD  insulin  glargine (LANTUS  SOLOSTAR) 100 UNIT/ML Solostar Pen Inject 33-35 Units into the skin daily. 08/29/23   Deforest Fast, MD  Insulin  Pen Needle (BD PEN NEEDLE NANO 2ND GEN) 32G X 4 MM MISC Inject 1 each into the skin in the morning, at noon, in the evening, and at bedtime. 08/30/21   CoxBurleigh Carp, MD  isosorbide  mononitrate (IMDUR ) 30 MG 24 hr tablet Take 1 tablet (30 mg total) by mouth daily. 08/05/23   Sheryl Donna, NP  latanoprost  (XALATAN ) 0.005 % ophthalmic solution Place 1 drop into both eyes at bedtime. 08/14/23   Dana Stall, MD  linaclotide  (LINZESS ) 145 MCG CAPS capsule Take 1 capsule (145 mcg total) by mouth daily before breakfast. 04/29/23   Cox, Kirsten, MD  LORazepam  (ATIVAN ) 0.5 MG tablet Take 1 tablet (0.5 mg total) by mouth daily as needed for anxiety. 04/29/23   CoxBurleigh Carp, MD  magnesium  oxide (MAG-OX) 400 MG tablet Take 2 tablets (800 mg total) by mouth daily. Take 2 (400 mg) daily=800 mg 04/29/23   Cox, Burleigh Carp, MD  Menthol -Methyl Salicylate  (ICY HOT EXTRA STRENGTH) 10-30 % CREA Apply 1 Application topically as needed (bilateral hips and knees). 10/16/23   Cyndi Drain, PA-C  nitroGLYCERIN  (NITROSTAT ) 0.4 MG SL tablet DISSOLVE ONE TABLET UNDER THE TONGUE EVERY 5 MINUTES AS NEEDED FOR CHEST PAIN.  DO NOT EXCEED A TOTAL OF 3 DOSES  IN 15 MINUTES 04/29/23   Cox, Burleigh Carp, MD  Omega-3 Fatty Acids (FISH  OIL PO) Take 1 capsule by mouth daily.    [provider]  orphenadrine  (NORFLEX ) 100 MG tablet Take 1 tablet (100 mg total) by mouth 2 (two) times daily. 11/01/23   Odilia Bennett, PA  oxyCODONE  (ROXICODONE ) 5 MG immediate release tablet Take 1 tablet (5 mg total) by mouth every 4 (four) hours as needed for severe pain (pain score 7-10). 11/05/23   Roemhildt, Lorin T, PA-C  OXYGEN  Inhale 2 L/min into the lungs at bedtime.    [provider]  pantoprazole  (PROTONIX ) 40 MG tablet Take 1 tablet (40 mg total) by mouth daily. 04/29/23   CoxBurleigh Carp, MD  potassium chloride  SA (KLOR-CON  M) 20 MEQ tablet Take 1 tablet (20 mEq total) by mouth daily. 08/30/23   Deforest Fast, MD  Probiotic CAPS Take 1 capsule by mouth daily.    [provider]  promethazine  (PHENERGAN ) 25 MG tablet Take 1 tablet (25 mg total) by mouth daily as needed. 04/29/23   CoxBurleigh Carp, MD  ticagrelor  (BRILINTA ) 90 MG TABS tablet Take 1 tablet (90 mg total) by mouth 2 (two) times daily. 04/29/23   Cox, Burleigh Carp, MD  torsemide  (DEMADEX ) 20 MG tablet Take 3 tablets (60 mg total) by mouth daily. 10/17/23   Bensimhon, Daniel R, MD    Inpatient Medications: Scheduled Meds:  [START ON 11/12/2023] allopurinol   50 mg Oral Daily   [START ON 11/12/2023] aspirin  EC  81 mg Oral Daily   carvedilol   9.375 mg Oral BID   doxepin   150 mg Oral QHS   ezetimibe   10 mg Oral QPM   gabapentin   300 mg Oral TID   heparin   5,000 Units Subcutaneous Q8H   [START ON 11/12/2023] isosorbide  mononitrate  30 mg Oral Daily   latanoprost   1 drop Both Eyes QHS   [START ON 11/12/2023] pantoprazole   40 mg Oral Daily   sodium chloride  flush  3 mL Intravenous Q12H   [START ON 11/12/2023] torsemide   60 mg Oral Daily   Continuous Infusions:  PRN Meds: acetaminophen  **OR** acetaminophen , HYDROcodone -acetaminophen , LORazepam , polyethylene glycol  Allergies:    Allergies   Allergen Reactions   Naproxen Hives and Rash   Shellfish-Derived Products Hives, Swelling and Other (See Comments)    Angioedema    Strawberry (Diagnostic) Hives   Celecoxib Other (See Comments)    Stomach pain   Glucosamine Hives, Swelling and Other (See Comments)    Angioedema    Iron  Hives and Other (See Comments)    IV iron  transfusion - hives - Feb 2022 hospitalization  Patient said she CAN tolerate if given Benadryl  first   Metoclopramide Other (See Comments)    Facial twitching and stuttering   Metolazone  Other (See Comments)    Acute renal failure   Statins Other (See Comments)    Muscle pain    Social History:   Social History   Socioeconomic History   Marital status: Married    Spouse name: [name]   Number of children: 2   Years of education: 16   Highest education level: Manufacturing engineer (e.g., MA, MS, MEng, MEd, MSW, MBA)  Occupational History   Occupation: on disability/retired early former Engineer, site  Tobacco Use   Smoking status: Former    Current packs/day: 0.00    Types: Cigarettes    Start date: 58    Quit date: 1984    Years since quitting: 41.3    Passive exposure: Past   Smokeless tobacco: Never  Vaping Use   Vaping status:  Never Used  Substance and Sexual Activity   Alcohol use: Never   Drug use: Never   Sexual activity: Not on file  Other Topics Concern   Not on file  Social History Narrative   Not on file   Social Drivers of Health   Financial Resource Strain: Low Risk  (10/24/2023)   Overall Financial Resource Strain (CARDIA)    Difficulty of Paying Living Expenses: Not hard at all  Food Insecurity: No Food Insecurity (10/24/2023)   Hunger Vital Sign    Worried About Running Out of Food in the Last Year: Never true    Ran Out of Food in the Last Year: Never true  Transportation Needs: No Transportation Needs (10/24/2023)   PRAPARE - Administrator, Civil Service (Medical): No    Lack of Transportation  (Non-Medical): No  Physical Activity: Insufficiently Active (10/24/2023)   Exercise Vital Sign    Days of Exercise per Week: 3 days    Minutes of Exercise per Session: 30 min  Stress: Stress Concern Present (10/24/2023)   [employer] of Occupational Health - Occupational Stress Questionnaire    Feeling of Stress : To some extent  Social Connections: Moderately Integrated (10/24/2023)   Social Connection and Isolation Panel [NHANES]    Frequency of Communication with Friends and Family: More than three times a week    Frequency of Social Gatherings with Friends and Family: Three times a week    Attends Religious Services: More than 4 times per year    Active Member of Clubs or Organizations: No    Attends Banker Meetings: Never    Marital Status: Married  Catering manager Violence: Not At Risk (10/24/2023)   Humiliation, Afraid, Rape, and Kick questionnaire    Fear of Current or Ex-Partner: No    Emotionally Abused: No    Physically Abused: No    Sexually Abused: No    Family History:    Family History  Problem Relation Age of Onset   Breast cancer Mother 75       contralateral breast ca dx 2   Coronary artery disease Mother    Hypertension Mother    Hyperlipidemia Mother    Diabetes type II Mother    Lung cancer Mother 11   Diabetes Father    Hypertension Father    Mental illness Sister    Bipolar disorder Other    Depression Other    Schizophrenia Other    Cancer Other        MGF's sisters x2; unknown type     ROS:  Please see the history of present illness.   All other ROS reviewed and negative.     Physical Exam/Data:   Vitals:   11/11/23 1417  BP: (!) 124/51  Pulse: 73  Temp: 98.1 F (36.7 C)  TempSrc: Oral  SpO2: 91%   No intake or output data in the 24 hours ending 11/11/23 1525    11/05/2023    8:05 PM 11/01/2023   10:14 AM 10/24/2023   10:04 AM  Last 3 Weights  Weight (lbs) 214 lb 214 lb 222 lb  Weight (kg) 97.07 kg 97.07 kg  100.699 kg     There is no height or weight on file to calculate BMI.  General:  Well nourished, well developed, in no acute distress HEENT: normal Neck: no JVD Vascular: No carotid bruits; Distal pulses 2+ bilaterally Cardiac:  normal S1, S2; RRR; no murmur  Lungs:  clear to  auscultation bilaterally, no wheezing, rhonchi or rales  Abd: soft, nontender, no hepatomegaly  Ext:  left leg wrapped, RLE with minimal edema Musculoskeletal:  No deformities, BUE and BLE strength normal and equal Skin: warm and dry  Neuro:  CNs 2-12 intact, no focal abnormalities noted Psych:  Normal affect   EKG:  The EKG was personally reviewed and demonstrates:  not yet done Telemetry:  Telemetry was personally reviewed and demonstrates:  NA  Relevant CV Studies:  Coronary balloon angioplasty 07/2023:   Mid LAD to Dist LAD lesion is 60% stenosed.   Mid LAD lesion is 90% stenosed.   Mid Cx to Dist Cx lesion is 100% stenosed.   1st Mrg-1 lesion is 30% stenosed.   1st Mrg-2 lesion is 90% stenosed.   Non-stenotic Mid Cx lesion was previously treated.   Scoring balloon angioplasty was performed using a BALLN SCOREFLEX 3.0X10.   Post intervention, there is a 10% residual stenosis.   Severe restenosis of a focal segment of the proximal LAD stented segment.  Successful scoring balloon angioplasty of the proximal LAD stented segment.    Recommendations: She did well with her procedure. This will most likely not be a long lasting result. In July 2024 IVUS was performed and the stents are all well expanded in the proximal LAD. Scoring balloon angioplasty today alone. No new stents placed. Continue current medical therapy including ASA and Brilinta . Gentle hydration overnight and hopeful d/c home tomorrow.    Echo 08/2023:  1. LVEF mildly reduced. Severe hypokinesis of the basal to mid posterior  segments and basal inferior segment. Left ventricular ejection fraction,  by estimation, is 40 to 45%. The left  ventricle has mildly decreased  function. The left ventricle  demonstrates regional wall motion abnormalities (see scoring  diagram/findings for description). Left ventricular diastolic function  could not be evaluated.   2. Right ventricular systolic function was not well visualized. The right  ventricular size is not well visualized. Tricuspid regurgitation signal is  inadequate for assessing PA pressure.   3. Left atrial size was mildly dilated.   4. S/p mitraclip. Mild residual MR. The mitral valve has been  repaired/replaced. Mild mitral valve regurgitation. No evidence of mitral  stenosis. The mean mitral valve gradient is 4.3 mmHg with average heart  rate of 69 bpm.   5. The aortic valve is grossly normal. Aortic valve regurgitation is not  visualized. No aortic stenosis is present.   6. The inferior vena cava is normal in size with <50% respiratory  variability, suggesting right atrial pressure of 8 mmHg.   Laboratory Data:  High Sensitivity Troponin:  No results for input(s): "TROPONINIHS" in the last 720 hours.   ChemistryNo results for input(s): "NA", "K", "CL", "CO2", "GLUCOSE", "BUN", "CREATININE", "CALCIUM ", "MG", "GFRNONAA", "GFRAA", "ANIONGAP" in the last 168 hours.  No results for input(s): "PROT", "ALBUMIN ", "AST", "ALT", "ALKPHOS", "BILITOT" in the last 168 hours. Lipids No results for input(s): "CHOL", "TRIG", "HDL", "LABVLDL", "LDLCALC", "CHOLHDL" in the last 168 hours.  HematologyNo results for input(s): "WBC", "RBC", "HGB", "HCT", "MCV", "MCH", "MCHC", "RDW", "PLT" in the last 168 hours. Thyroid  No results for input(s): "TSH", "FREET4" in the last 168 hours.  BNPNo results for input(s): "BNP", "PROBNP" in the last 168 hours.  DDimer No results for input(s): "DDIMER" in the last 168 hours.   Radiology/Studies:  No results found.   Assessment and Plan:   CAD She has had extensive CAD with prior PCI's and multiple evidence of in-stent restenosis  in the  proximal LAD with last intervention January 2025.   - scoring balloon angioplasty mid LAD 07/2023 - she remains on DAPT with ASA and brilinta   - continue coreg , ASA - reaching out to IC team for brilinta  recommendations   Hyperlipidemia with LDL goal < 55 - continue repatha  and zetia  - recent LDL was 28   Moderate MR - failed mitraclip, last echo 08/2023   Chronic diastolic heart failure - preserved LVEF, appears euvolemic GDMT with coreg , torsemide , imdur  - no SGLT2i given DM1   CKD stage IV - sCr today 2.57 - appears close to baseline   Preoperative risk evaluation for MACE prior to ankle surgery She has a history of CAD, with significant in-stent restenosis in the proximal LAD in the setting of overlapping DES, reported history of stroke, insulin -dependent diabetes, and CKD stage IV with creatinine 2.57.  She is high risk for any procedure with an RCRI of 11% risk of Mace.  Given her recent balloon angioplasty less than 6 months ago, we will reach out to the interventional cardiology team for advisement regarding holding Brilinta  for surgery versus bridging with IV cangrelor. She is nearly 4 months from last PCI without stent placement.    Risk Assessment/Risk Scores:         For questions or updates, please contact [facility] Please consult [email or web address] for contact info under    Signed, Lamond Pilot, [place]  11/11/2023 3:25 PM

## 2023-11-11 NOTE — Consult Note (Signed)
 Orthopaedic Trauma Service (OTS) Consult   Patient ID: Dana [name] MRN: [id number] DOB/AGE: 1961/01/17 63 y.o.     HPI: Dana [name] is an 63 y.o. female who sustained a ground level fall on 11/05/2023. She presented to the ED and was found to have a L trimalleolar ankle fracture. She was splinted and discharged from the ED. she presented to our orthopedic office on 11/11/2023 and was noted to have dislocation of her left ankle.  She was unable to comply with nonweightbearing restrictions.  She had a reduction performed in the office and splint was reapplied.  Due to her inability to comply with weightbearing restrictions, numerous chronic medical comorbidities and essentially failure to thrive she was admitted from our office to [facility] in anticipation for surgery.  She has numerous medical comorbidities including CAD, CKD, type 1 diabetes with peripheral neuropathy, CHF amongst other medical issues  Past Medical History:  Diagnosis Date   Acquired absence of left great toe (HCC)    Acquired absence of other left toe(s) (HCC)    ACS (acute coronary syndrome) (HCC)    Acute bronchitis due to COVID-19 virus 03/03/2023   Acute on chronic combined systolic and diastolic CHF (congestive heart failure) (HCC) 09/16/2023   Acute respiratory failure with hypoxia (HCC) 07/20/2023   Anemia    Anoxic brain injury (HCC)    Anxiety    CAD (coronary artery disease)    DES mLAD 04/07/15; DES dRCA & DES pPDA 08/30/16; DES mCX & PTCA OM1 09/29/20   Chronic diastolic (congestive) heart failure (HCC)    Chronic pain    Chronic systolic (congestive) heart failure (HCC)    CKD (chronic kidney disease)    Contrast dye induced nephropathy, possible 09/03/2020   COPD (chronic obstructive pulmonary disease) (HCC)    COVID-19 virus infection 03/03/2023   Demand ischemia (HCC)    Diabetes (HCC)type 1    Diastolic CHF (HCC)    Dyspnea    Encounter for screening mammogram for  malignant neoplasm of breast 09/16/2023   Family history of breast cancer 10/23/2021   Gastro-esophageal reflux disease without esophagitis    Hyperlipidemia    Hypertension    Hypertensive heart disease with heart failure (HCC)    Hypoxemia    Idiopathic gout, multiple sites    Leukocytosis 03/29/2022   Localized edema    Low back pain    Mitral regurgitation    Mitral regurgitation    Mixed hyperlipidemia    Mixed incontinence    Morbid (severe) obesity due to excess calories (HCC)    Neuromuscular disorder (HCC)    Neuropathy    Non-ST elevation (NSTEMI) myocardial infarction (HCC)    08/29/20, 09/29/20   OSA (obstructive sleep apnea) 09/03/2020   Other specified hypothyroidism    PAF (paroxysmal atrial fibrillation) (HCC)    s/p pulmonary vein isolation by cryoablation 10/04/15 Dr. Jearldine Mina in [facility]   PEA (Pulseless electrical activity) [facility])    ~ 01/13/21 at [facility] during admission DKA, volume overload, possible CAP, hypoxia s/p ACLS with ROSC ~ 10-15   Pneumonia    Primary insomnia    Rheumatoid arthritis (HCC)    Stroke (HCC)    Thyroid  disease    Type 1 diabetes mellitus (HCC)    URI with cough and congestion 08/22/2023   Vitamin D  deficiency     Past Surgical History:  Procedure Laterality Date   ABDOMINAL HYSTERECTOMY     Partial-  Still has both ovaries   CARDIOVASCULAR STRESS TEST  08/13/2014   Nuclear; Normal   CARPAL TUNNEL RELEASE     CATARACT EXTRACTION, BILATERAL     CESAREAN SECTION     CHOLECYSTECTOMY     CORONARY ANGIOPLASTY WITH STENT PLACEMENT     3 blockages/ 1 stent   CORONARY BALLOON ANGIOPLASTY N/A 07/18/2023   Procedure: CORONARY BALLOON ANGIOPLASTY;  Surgeon: Odie Benne, MD;  Location: [facility] INVASIVE CV LAB;  Service: Cardiovascular;  Laterality: N/A;   CORONARY PRESSURE/FFR STUDY N/A 09/07/2022   Procedure: INTRAVASCULAR PRESSURE WIRE/FFR STUDY;  Surgeon: Swaziland, Peter M, MD;  Location: [facility] INVASIVE CV LAB;  Service:  Cardiovascular;  Laterality: N/A;   CORONARY STENT INTERVENTION N/A 09/29/2020   Procedure: CORONARY STENT INTERVENTION;  Surgeon: Arnoldo Lapping, MD;  Location: [facility] INVASIVE CV LAB;  Service: Cardiovascular;  Laterality: N/A;  CFX   CORONARY STENT INTERVENTION N/A 09/07/2022   Procedure: CORONARY STENT INTERVENTION;  Surgeon: Swaziland, Peter M, MD;  Location: [facility] INVASIVE CV LAB;  Service: Cardiovascular;  Laterality: N/A;   CORONARY STENT INTERVENTION N/A 02/13/2023   Procedure: CORONARY STENT INTERVENTION;  Surgeon: Odie Benne, MD;  Location: [facility] INVASIVE CV LAB;  Service: Cardiovascular;  Laterality: N/A;  LAD   CORONARY ULTRASOUND/IVUS N/A 02/13/2023   Procedure: Coronary Ultrasound/IVUS;  Surgeon: Odie Benne, MD;  Location: [facility] INVASIVE CV LAB;  Service: Cardiovascular;  Laterality: N/A;   CORONARY/GRAFT ACUTE MI REVASCULARIZATION N/A 12/08/2022   Procedure: Coronary/Graft Acute MI Revascularization;  Surgeon: Arleen Lacer, MD;  Location[facility] INVASIVE CV LAB;  Service: Cardiovascular;  Laterality: N/A;   EYE SURGERY     LEFT HEART CATH AND CORONARY ANGIOGRAPHY N/A 09/29/2020   Procedure: LEFT HEART CATH AND CORONARY ANGIOGRAPHY;  Surgeon: Arnoldo Lapping, MD;  Location: [facility] INVASIVE CV LAB;  Service: Cardiovascular;  Laterality: N/A;   LEFT HEART CATH AND CORONARY ANGIOGRAPHY N/A 09/07/2022   Procedure: LEFT HEART CATH AND CORONARY ANGIOGRAPHY;  Surgeon: Swaziland, Peter M, MD;  Location: [facility] INVASIVE CV LAB;  Service: Cardiovascular;  Laterality: N/A;   LEFT HEART CATH AND CORONARY ANGIOGRAPHY N/A 12/08/2022   Procedure: LEFT HEART CATH AND CORONARY ANGIOGRAPHY;  Surgeon: Arleen Lacer, MD;  Location: [facility] INVASIVE CV LAB;  Service: Cardiovascular;  Laterality: N/A;   LEFT HEART CATH AND CORONARY ANGIOGRAPHY N/A 02/13/2023   Procedure: LEFT HEART CATH AND CORONARY ANGIOGRAPHY;  Surgeon: Odie Benne, MD;  Location: [facility]VAS[facility] LAB;  Service: Cardiovascular;   Laterality: N/A;   LEFT HEART CATH AND CORONARY ANGIOGRAPHY N/A 07/16/2023   Procedure: LEFT HEART CATH AND CORONARY ANGIOGRAPHY;  Surgeon: Odie Benne, MD;  Location: [facility]VAS[facility] CV LAB;  Service: Cardiovascular;  Laterality: N/A;   RIGHT HEART CATH N/A 04/27/2021   Procedure: RIGHT HEART CATH;  Surgeon: Mardell Shade, MD;  Location: [facility] INVASIVE CV LAB;  Service: Cardiovascular;  Laterality: N/A;   RIGHT/LEFT HEART CATH AND CORONARY ANGIOGRAPHY N/A 01/07/2017   Procedure: Right/Left Heart Cath and Coronary Angiography;  Surgeon: Darlis Eisenmenger, MD;  Location: [facility] INVASIVE CV LAB;  Service: Cardiovascular;  Laterality: N/A;   ROTATOR CUFF REPAIR     TEE WITHOUT CARDIOVERSION N/A 04/28/2021   Procedure: TRANSESOPHAGEAL ECHOCARDIOGRAM (TEE);  Surgeon: Mardell Shade, MD;  Location: [facility] ENDOSCOPY;  Service: Cardiovascular;  Laterality: N/A;   TEE WITHOUT CARDIOVERSION N/A 05/18/2021   Procedure: TRANSESOPHAGEAL ECHOCARDIOGRAM (TEE);  Surgeon: Thukkani, Arun K, MD;  Location: [facility] INVASIVE CV LAB;  Service: Cardiovascular;  Laterality: N/A;   TOE AMPUTATION Left    TRANSCATHETER MITRAL EDGE TO EDGE REPAIR N/A 05/18/2021   Procedure: MITRAL VALVE REPAIR;  Surgeon: Kyra Phy, MD;  Location: [facility] INVASIVE CV LAB;  Service: Cardiovascular;  Laterality: N/A;   US  ECHOCARDIOGRAPHY  05/2017   Normal    Family History  Problem Relation Age of Onset   Breast cancer Mother 1       contralateral breast ca dx 71   Coronary artery disease Mother    Hypertension Mother    Hyperlipidemia Mother    Diabetes type II Mother    Lung cancer Mother 34   Diabetes Father    Hypertension Father    Mental illness Sister    Bipolar disorder Other    Depression Other    Schizophrenia Other    Cancer Other        MGF's sisters x2; unknown type    Social History:  reports that she quit smoking about 41 years ago. Her smoking use included cigarettes. She started smoking about 42 years  ago. She has been exposed to tobacco smoke. She has never used smokeless tobacco. She reports that she does not drink alcohol and does not use drugs.  Allergies:  Allergies  Allergen Reactions   Naproxen Hives and Rash   Shellfish-Derived Products Hives, Swelling and Other (See Comments)    Angioedema    Strawberry (Diagnostic) Hives   Celecoxib Other (See Comments)    Stomach pain   Glucosamine Hives, Swelling and Other (See Comments)    Angioedema    Iron  Hives and Other (See Comments)    IV iron  transfusion - hives - Feb 2022 hospitalization  Patient said she CAN tolerate if given Benadryl  first   Metoclopramide Other (See Comments)    Facial twitching and stuttering   Metolazone  Other (See Comments)    Acute renal failure   Statins Other (See Comments)    Muscle pain    Medications: I have reviewed the patient's current medications. Current Outpatient Medications  Medication Instructions   acetaminophen  (TYLENOL ) 1,000 mg, Every 6 hours PRN   allopurinol  (ZYLOPRIM ) 50 mg, Oral, Daily   Aspirin  Low Dose 81 mg, Oral, Daily, Swallow whole.   Brilinta  90 mg, Oral, 2 times daily   carvedilol  (COREG ) 9.375 mg, Oral, 2 times daily   cefUROXime (CEFTIN) 500 mg, 2 times daily   Continuous Glucose Sensor (FREESTYLE LIBRE 2 SENSOR) MISC Inject 1 device into skin every 14 (fourteen) days.   doxepin  (SINEQUAN ) 150 mg, Oral, Daily at bedtime   EPINEPHrine  0.3 mg/0.3 mL IJ SOAJ injection Use as directed for life-threatening allergic reaction.   ezetimibe  (ZETIA ) 10 mg, Oral, Every evening   gabapentin  (NEURONTIN ) 300 mg, Oral, 3 times daily   Gvoke HypoPen  2-Pack 0.5 mg, Subcutaneous, Daily PRN   HYDROcodone -acetaminophen  (NORCO) 7.5-325 MG tablet 1 tablet, Oral, 3 times daily PRN   insulin  aspart (NOVOLOG  FLEXPEN) 100 UNIT/ML FlexPen Inject 2 - 10 Units before meals based on SSI.   Insulin  Pen Needle (BD PEN NEEDLE NANO 2ND GEN) 32G X 4 MM MISC 1 each, Subcutaneous, 4 times daily    isosorbide  mononitrate (IMDUR ) 30 mg, Oral, Daily   Lantus  SoloStar 33-35 Units, Subcutaneous, Daily   latanoprost  (XALATAN ) 0.005 % ophthalmic solution 1 drop, Both Eyes, Daily at bedtime   Linzess  145 mcg, Oral, Daily before breakfast   LORazepam  (ATIVAN ) 0.5 mg, Oral, Daily PRN   magnesium  oxide (MAG-OX) 800 mg, Oral, Daily, Take 2 (  400 mg) daily=800 mg   Menthol -Methyl Salicylate  (ICY HOT EXTRA STRENGTH) 10-30 % CREA 1 Application, Apply externally, As needed   nitroGLYCERIN  (NITROSTAT ) 0.4 MG SL tablet DISSOLVE ONE TABLET UNDER THE TONGUE EVERY 5 MINUTES AS NEEDED FOR CHEST PAIN.  DO NOT EXCEED A TOTAL OF 3 DOSES IN 15 MINUTES   Omega-3 Fatty Acids (FISH OIL PO) 1 capsule, Daily   orphenadrine  (NORFLEX ) 100 mg, Oral, 2 times daily   oxyCODONE  (ROXICODONE ) 5 mg, Oral, Every 4 hours PRN   OXYGEN  2 L/min, Daily at bedtime   pantoprazole  (PROTONIX ) 40 mg, Oral, Daily   potassium chloride  SA (KLOR-CON  M) 20 MEQ tablet 20 mEq, Oral, Daily   Probiotic CAPS 1 capsule, Daily   promethazine  (PHENERGAN ) 25 mg, Oral, Daily PRN   Repatha  SureClick 140 mg, Subcutaneous, Every 14 days   torsemide  (DEMADEX ) 60 mg, Oral, Daily     Results for orders placed or performed during the hospital encounter of 11/11/23 (from the past 48 hours)  Comprehensive metabolic panel     Status: Abnormal   Collection Time: 11/11/23  3:12 PM  Result Value Ref Range   Sodium 132 (L) 135 - 145 mmol/L   Potassium 4.5 3.5 - 5.1 mmol/L   Chloride 97 (L) 98 - 111 mmol/L   CO2 24 22 - 32 mmol/L   Glucose, Bld 301 (H) 70 - 99 mg/dL    Comment: Glucose reference range applies only to samples taken after fasting for at least 8 hours.   BUN 88 (H) 6 - 20 mg/dL   Creatinine, Ser 0.98 (H) 0.44 - 1.00 mg/dL   Calcium  8.7 (L) 8.9 - 10.3 mg/dL   Total Protein 7.3 6.5 - 8.1 g/dL   Albumin  2.3 (L) 3.5 - 5.0 g/dL   AST 9 (L) 15 - 41 U/L   ALT 8 0 - 44 U/L   Alkaline Phosphatase 98 38 - 126 U/L   Total Bilirubin 0.8 0.0 - 1.2  mg/dL   GFR, Estimated 21 (L) >60 mL/min    Comment: (NOTE) Calculated using the CKD-EPI Creatinine Equation (2021)    Anion gap 11 5 - 15    Comment: Performed at [facility], [address], [place], [place] [address]  Magnesium      Status: None   Collection Time: 11/11/23  3:12 PM  Result Value Ref Range   Magnesium  1.8 1.7 - 2.4 mg/dL    Comment: Performed at [facility], [address], [place], [place] [address]  CBC     Status: Abnormal   Collection Time: 11/11/23  3:12 PM  Result Value Ref Range   WBC 15.3 (H) 4.0 - 10.5 K/uL   RBC 3.08 (L) 3.87 - 5.11 MIL/uL   Hemoglobin 8.3 (L) 12.0 - 15.0 g/dL   HCT 62.1 (L) 30.8 - 65.7 %   MCV 84.7 80.0 - 100.0 fL   MCH 26.9 26.0 - 34.0 pg   MCHC 31.8 30.0 - 36.0 g/dL   RDW 84.6 96.2 - 95.2 %   Platelets 340 150 - 400 K/uL   nRBC 0.0 0.0 - 0.2 %    Comment: Performed at [facility], [address] [place], [place] [address]   *Note: Due to a large number of results and/or encounters for the requested time period, some results have not been displayed. A complete set of results can be found in Results Review.    No results found.  Intake/Output    None  ROS As above Blood pressure (!) 124/51, pulse 73, temperature 98.1 F (36.7 C), temperature source Oral, SpO2 91%. Physical Exam Vitals and nursing note reviewed.  Constitutional:      General: She is not in acute distress.    Appearance: She is obese.  Cardiovascular:     Heart sounds: S1 normal and S2 normal.  Pulmonary:     Effort: No respiratory distress.  Musculoskeletal:     Comments: Left Lower Extremity  SLS in place Swelling moderate laterally in the office Ext warm  + neuropathy  + 1st toe amputation  Skin changes to leg consistent with PVD  Skin:    General: Skin is warm.  Neurological:     Mental Status: She is alert and oriented to person, place, and time.     Comments: Did not assess coordination or gait   Psychiatric:         Attention and Perception: Attention normal.        Mood and Affect: Mood normal.        Behavior: Behavior is cooperative.       Assessment/Plan:  63 year old female ground-level fall with numerous medical comorbidities with left trimalleolar ankle fracture dislocation  -fall  - Left trimalleolar ankle fracture dislocation  Patient will need surgical stabilization  Given her numerous comorbidities including diabetes with peripheral neuropathy and inability to mobilize safely and be nonweightbearing we think that it would be best to perform fixation utilizing a hindfoot fusion nail versus formal ORIF.  Anticipate surgical intervention later on this week either Wednesday or Thursday  Nonweightbearing left leg  Okay to start working with therapies  Aggressive ice and elevation, "toes above nose"  - Pain management:  Multimodal  - Medical issues   Per medicine   Hemoglobin A1c was 9.0% October 2024  - DVT/PE prophylaxis:  SQ heparin   - Metabolic Bone Disease:  Check vitamin D   - Activity:  As above  - Dispo:  OR later this week to address left trimalleolar ankle fracture dislocation    Geroldine Kotyk, PA-C [phone] (C) 11/11/2023, 4:13 PM  [facility] [address] [place] [address] [phone] Deanna [phone] (F)    After 5pm and on the weekends please log on to Amion, go to orthopaedics and the look under the [facility] Call for the provider(s) on call. You can also call our office at [phone] and then follow the prompts to be connected to the call team.

## 2023-11-11 NOTE — Progress Notes (Signed)
 Placed patient on CPAP for the night set at 8cm per what it feels like at home.

## 2023-11-11 NOTE — H&P (View-Only) (Signed)
 Orthopaedic Trauma Service (OTS) Consult   Patient ID: Dana [name] MRN: [id number] DOB/AGE: 1961/01/17 63 y.o.     HPI: Dana [name] is an 63 y.o. female who sustained a ground level fall on 11/05/2023. She presented to the ED and was found to have a L trimalleolar ankle fracture. She was splinted and discharged from the ED. she presented to our orthopedic office on 11/11/2023 and was noted to have dislocation of her left ankle.  She was unable to comply with nonweightbearing restrictions.  She had a reduction performed in the office and splint was reapplied.  Due to her inability to comply with weightbearing restrictions, numerous chronic medical comorbidities and essentially failure to thrive she was admitted from our office to [facility] in anticipation for surgery.  She has numerous medical comorbidities including CAD, CKD, type 1 diabetes with peripheral neuropathy, CHF amongst other medical issues  Past Medical History:  Diagnosis Date   Acquired absence of left great toe (HCC)    Acquired absence of other left toe(s) (HCC)    ACS (acute coronary syndrome) (HCC)    Acute bronchitis due to COVID-19 virus 03/03/2023   Acute on chronic combined systolic and diastolic CHF (congestive heart failure) (HCC) 09/16/2023   Acute respiratory failure with hypoxia (HCC) 07/20/2023   Anemia    Anoxic brain injury (HCC)    Anxiety    CAD (coronary artery disease)    DES mLAD 04/07/15; DES dRCA & DES pPDA 08/30/16; DES mCX & PTCA OM1 09/29/20   Chronic diastolic (congestive) heart failure (HCC)    Chronic pain    Chronic systolic (congestive) heart failure (HCC)    CKD (chronic kidney disease)    Contrast dye induced nephropathy, possible 09/03/2020   COPD (chronic obstructive pulmonary disease) (HCC)    COVID-19 virus infection 03/03/2023   Demand ischemia (HCC)    Diabetes (HCC)type 1    Diastolic CHF (HCC)    Dyspnea    Encounter for screening mammogram for  malignant neoplasm of breast 09/16/2023   Family history of breast cancer 10/23/2021   Gastro-esophageal reflux disease without esophagitis    Hyperlipidemia    Hypertension    Hypertensive heart disease with heart failure (HCC)    Hypoxemia    Idiopathic gout, multiple sites    Leukocytosis 03/29/2022   Localized edema    Low back pain    Mitral regurgitation    Mitral regurgitation    Mixed hyperlipidemia    Mixed incontinence    Morbid (severe) obesity due to excess calories (HCC)    Neuromuscular disorder (HCC)    Neuropathy    Non-ST elevation (NSTEMI) myocardial infarction (HCC)    08/29/20, 09/29/20   OSA (obstructive sleep apnea) 09/03/2020   Other specified hypothyroidism    PAF (paroxysmal atrial fibrillation) (HCC)    s/p pulmonary vein isolation by cryoablation 10/04/15 Dr. Jearldine Mina in [facility]   PEA (Pulseless electrical activity) [facility])    ~ 01/13/21 at [facility] during admission DKA, volume overload, possible CAP, hypoxia s/p ACLS with ROSC ~ 10-15   Pneumonia    Primary insomnia    Rheumatoid arthritis (HCC)    Stroke (HCC)    Thyroid  disease    Type 1 diabetes mellitus (HCC)    URI with cough and congestion 08/22/2023   Vitamin D  deficiency     Past Surgical History:  Procedure Laterality Date   ABDOMINAL HYSTERECTOMY     Partial-  Still has both ovaries   CARDIOVASCULAR STRESS TEST  08/13/2014   Nuclear; Normal   CARPAL TUNNEL RELEASE     CATARACT EXTRACTION, BILATERAL     CESAREAN SECTION     CHOLECYSTECTOMY     CORONARY ANGIOPLASTY WITH STENT PLACEMENT     3 blockages/ 1 stent   CORONARY BALLOON ANGIOPLASTY N/A 07/18/2023   Procedure: CORONARY BALLOON ANGIOPLASTY;  Surgeon: Odie Benne, MD;  Location: [facility] INVASIVE CV LAB;  Service: Cardiovascular;  Laterality: N/A;   CORONARY PRESSURE/FFR STUDY N/A 09/07/2022   Procedure: INTRAVASCULAR PRESSURE WIRE/FFR STUDY;  Surgeon: Swaziland, Peter M, MD;  Location: [facility] INVASIVE CV LAB;  Service:  Cardiovascular;  Laterality: N/A;   CORONARY STENT INTERVENTION N/A 09/29/2020   Procedure: CORONARY STENT INTERVENTION;  Surgeon: Arnoldo Lapping, MD;  Location: [facility] INVASIVE CV LAB;  Service: Cardiovascular;  Laterality: N/A;  CFX   CORONARY STENT INTERVENTION N/A 09/07/2022   Procedure: CORONARY STENT INTERVENTION;  Surgeon: Swaziland, Peter M, MD;  Location: [facility] INVASIVE CV LAB;  Service: Cardiovascular;  Laterality: N/A;   CORONARY STENT INTERVENTION N/A 02/13/2023   Procedure: CORONARY STENT INTERVENTION;  Surgeon: Odie Benne, MD;  Location: [facility] INVASIVE CV LAB;  Service: Cardiovascular;  Laterality: N/A;  LAD   CORONARY ULTRASOUND/IVUS N/A 02/13/2023   Procedure: Coronary Ultrasound/IVUS;  Surgeon: Odie Benne, MD;  Location: [facility] INVASIVE CV LAB;  Service: Cardiovascular;  Laterality: N/A;   CORONARY/GRAFT ACUTE MI REVASCULARIZATION N/A 12/08/2022   Procedure: Coronary/Graft Acute MI Revascularization;  Surgeon: Arleen Lacer, MD;  Location: [facility] INVASIVE CV LAB;  Service: Cardiovascular;  Laterality: N/A;   EYE SURGERY     LEFT HEART CATH AND CORONARY ANGIOGRAPHY N/A 09/29/2020   Procedure: LEFT HEART CATH AND CORONARY ANGIOGRAPHY;  Surgeon: Arnoldo Lapping, MD;  Location: [facility] INVASIVE CV LAB;  Service: Cardiovascular;  Laterality: N/A;   LEFT HEART CATH AND CORONARY ANGIOGRAPHY N/A 09/07/2022   Procedure: LEFT HEART CATH AND CORONARY ANGIOGRAPHY;  Surgeon: Swaziland, Peter M, MD;  Location: [facility] INVASIVE CV LAB;  Service: Cardiovascular;  Laterality: N/A;   LEFT HEART CATH AND CORONARY ANGIOGRAPHY N/A 12/08/2022   Procedure: LEFT HEART CATH AND CORONARY ANGIOGRAPHY;  Surgeon: Arleen Lacer, MD;  Location: [facility] INVASIVE CV LAB;  Service: Cardiovascular;  Laterality: N/A;   LEFT HEART CATH AND CORONARY ANGIOGRAPHY N/A 02/13/2023   Procedure: LEFT HEART CATH AND CORONARY ANGIOGRAPHY;  Surgeon: Odie Benne, MD;  Location: [facility] INVASIVE CV LAB;  Service: Cardiovascular;   Laterality: N/A;   LEFT HEART CATH AND CORONARY ANGIOGRAPHY N/A 07/16/2023   Procedure: LEFT HEART CATH AND CORONARY ANGIOGRAPHY;  Surgeon: Odie Benne, MD;  Location: [facility] INVASIVE CV LAB;  Service: Cardiovascular;  Laterality: N/A;   RIGHT HEART CATH N/A 04/27/2021   Procedure: RIGHT HEART CATH;  Surgeon: Mardell Shade, MD;  Location: [facility] INVASIVE CV LAB;  Service: Cardiovascular;  Laterality: N/A;   RIGHT/LEFT HEART CATH AND CORONARY ANGIOGRAPHY N/A 01/07/2017   Procedure: Right/Left Heart Cath and Coronary Angiography;  Surgeon: Darlis Eisenmenger, MD;  Location: [facility] INVASIVE CV LAB;  Service: Cardiovascular;  Laterality: N/A;   ROTATOR CUFF REPAIR     TEE WITHOUT CARDIOVERSION N/A 04/28/2021   Procedure: TRANSESOPHAGEAL ECHOCARDIOGRAM (TEE);  Surgeon: Mardell Shade, MD;  Location: [facility] ENDOSCOPY;  Service: Cardiovascular;  Laterality: N/A;   TEE WITHOUT CARDIOVERSION N/A 05/18/2021   Procedure: TRANSESOPHAGEAL ECHOCARDIOGRAM (TEE);  Surgeon: Thukkani, Arun K, MD;  Location: [facility] INVASIVE CV LAB;  Service: Cardiovascular;  Laterality: N/A;   TOE AMPUTATION Left    TRANSCATHETER MITRAL EDGE TO EDGE REPAIR N/A 05/18/2021   Procedure: MITRAL VALVE REPAIR;  Surgeon: Kyra Phy, MD;  Location: [facility] INVASIVE CV LAB;  Service: Cardiovascular;  Laterality: N/A;   US  ECHOCARDIOGRAPHY  05/2017   Normal    Family History  Problem Relation Age of Onset   Breast cancer Mother 1       contralateral breast ca dx 71   Coronary artery disease Mother    Hypertension Mother    Hyperlipidemia Mother    Diabetes type II Mother    Lung cancer Mother 34   Diabetes Father    Hypertension Father    Mental illness Sister    Bipolar disorder Other    Depression Other    Schizophrenia Other    Cancer Other        MGF's sisters x2; unknown type    Social History:  reports that she quit smoking about 41 years ago. Her smoking use included cigarettes. She started smoking about 42 years  ago. She has been exposed to tobacco smoke. She has never used smokeless tobacco. She reports that she does not drink alcohol and does not use drugs.  Allergies:  Allergies  Allergen Reactions   Naproxen Hives and Rash   Shellfish-Derived Products Hives, Swelling and Other (See Comments)    Angioedema    Strawberry (Diagnostic) Hives   Celecoxib Other (See Comments)    Stomach pain   Glucosamine Hives, Swelling and Other (See Comments)    Angioedema    Iron  Hives and Other (See Comments)    IV iron  transfusion - hives - Feb 2022 hospitalization  Patient said she CAN tolerate if given Benadryl  first   Metoclopramide Other (See Comments)    Facial twitching and stuttering   Metolazone  Other (See Comments)    Acute renal failure   Statins Other (See Comments)    Muscle pain    Medications: I have reviewed the patient's current medications. Current Outpatient Medications  Medication Instructions   acetaminophen  (TYLENOL ) 1,000 mg, Every 6 hours PRN   allopurinol  (ZYLOPRIM ) 50 mg, Oral, Daily   Aspirin  Low Dose 81 mg, Oral, Daily, Swallow whole.   Brilinta  90 mg, Oral, 2 times daily   carvedilol  (COREG ) 9.375 mg, Oral, 2 times daily   cefUROXime (CEFTIN) 500 mg, 2 times daily   Continuous Glucose Sensor (FREESTYLE LIBRE 2 SENSOR) MISC Inject 1 device into skin every 14 (fourteen) days.   doxepin  (SINEQUAN ) 150 mg, Oral, Daily at bedtime   EPINEPHrine  0.3 mg/0.3 mL IJ SOAJ injection Use as directed for life-threatening allergic reaction.   ezetimibe  (ZETIA ) 10 mg, Oral, Every evening   gabapentin  (NEURONTIN ) 300 mg, Oral, 3 times daily   Gvoke HypoPen  2-Pack 0.5 mg, Subcutaneous, Daily PRN   HYDROcodone -acetaminophen  (NORCO) 7.5-325 MG tablet 1 tablet, Oral, 3 times daily PRN   insulin  aspart (NOVOLOG  FLEXPEN) 100 UNIT/ML FlexPen Inject 2 - 10 Units before meals based on SSI.   Insulin  Pen Needle (BD PEN NEEDLE NANO 2ND GEN) 32G X 4 MM MISC 1 each, Subcutaneous, 4 times daily    isosorbide  mononitrate (IMDUR ) 30 mg, Oral, Daily   Lantus  SoloStar 33-35 Units, Subcutaneous, Daily   latanoprost  (XALATAN ) 0.005 % ophthalmic solution 1 drop, Both Eyes, Daily at bedtime   Linzess  145 mcg, Oral, Daily before breakfast   LORazepam  (ATIVAN ) 0.5 mg, Oral, Daily PRN   magnesium  oxide (MAG-OX) 800 mg, Oral, Daily, Take 2 (  400 mg) daily=800 mg   Menthol -Methyl Salicylate  (ICY HOT EXTRA STRENGTH) 10-30 % CREA 1 Application, Apply externally, As needed   nitroGLYCERIN  (NITROSTAT ) 0.4 MG SL tablet DISSOLVE ONE TABLET UNDER THE TONGUE EVERY 5 MINUTES AS NEEDED FOR CHEST PAIN.  DO NOT EXCEED A TOTAL OF 3 DOSES IN 15 MINUTES   Omega-3 Fatty Acids (FISH OIL PO) 1 capsule, Daily   orphenadrine  (NORFLEX ) 100 mg, Oral, 2 times daily   oxyCODONE  (ROXICODONE ) 5 mg, Oral, Every 4 hours PRN   OXYGEN  2 L/min, Daily at bedtime   pantoprazole  (PROTONIX ) 40 mg, Oral, Daily   potassium chloride  SA (KLOR-CON  M) 20 MEQ tablet 20 mEq, Oral, Daily   Probiotic CAPS 1 capsule, Daily   promethazine  (PHENERGAN ) 25 mg, Oral, Daily PRN   Repatha  SureClick 140 mg, Subcutaneous, Every 14 days   torsemide  (DEMADEX ) 60 mg, Oral, Daily     Results for orders placed or performed during the hospital encounter of 11/11/23 (from the past 48 hours)  Comprehensive metabolic panel     Status: Abnormal   Collection Time: 11/11/23  3:12 PM  Result Value Ref Range   Sodium 132 (L) 135 - 145 mmol/L   Potassium 4.5 3.5 - 5.1 mmol/L   Chloride 97 (L) 98 - 111 mmol/L   CO2 24 22 - 32 mmol/L   Glucose, Bld 301 (H) 70 - 99 mg/dL    Comment: Glucose reference range applies only to samples taken after fasting for at least 8 hours.   BUN 88 (H) 6 - 20 mg/dL   Creatinine, Ser 0.98 (H) 0.44 - 1.00 mg/dL   Calcium  8.7 (L) 8.9 - 10.3 mg/dL   Total Protein 7.3 6.5 - 8.1 g/dL   Albumin  2.3 (L) 3.5 - 5.0 g/dL   AST 9 (L) 15 - 41 U/L   ALT 8 0 - 44 U/L   Alkaline Phosphatase 98 38 - 126 U/L   Total Bilirubin 0.8 0.0 - 1.2  mg/dL   GFR, Estimated 21 (L) >60 mL/min    Comment: (NOTE) Calculated using the CKD-EPI Creatinine Equation (2021)    Anion gap 11 5 - 15    Comment: Performed at [facility], [address], [place], [place] [address]  Magnesium      Status: None   Collection Time: 11/11/23  3:12 PM  Result Value Ref Range   Magnesium  1.8 1.7 - 2.4 mg/dL    Comment: Performed at [facility], [address], [place], [place] [address]  CBC     Status: Abnormal   Collection Time: 11/11/23  3:12 PM  Result Value Ref Range   WBC 15.3 (H) 4.0 - 10.5 K/uL   RBC 3.08 (L) 3.87 - 5.11 MIL/uL   Hemoglobin 8.3 (L) 12.0 - 15.0 g/dL   HCT 62.1 (L) 30.8 - 65.7 %   MCV 84.7 80.0 - 100.0 fL   MCH 26.9 26.0 - 34.0 pg   MCHC 31.8 30.0 - 36.0 g/dL   RDW 84.6 96.2 - 95.2 %   Platelets 340 150 - 400 K/uL   nRBC 0.0 0.0 - 0.2 %    Comment: Performed at [facility], [address] [place], [place] [address]   *Note: Due to a large number of results and/or encounters for the requested time period, some results have not been displayed. A complete set of results can be found in Results Review.    No results found.  Intake/Output    None  ROS As above Blood pressure (!) 124/51, pulse 73, temperature 98.1 F (36.7 C), temperature source Oral, SpO2 91%. Physical Exam Vitals and nursing note reviewed.  Constitutional:      General: She is not in acute distress.    Appearance: She is obese.  Cardiovascular:     Heart sounds: S1 normal and S2 normal.  Pulmonary:     Effort: No respiratory distress.  Musculoskeletal:     Comments: Left Lower Extremity  SLS in place Swelling moderate laterally in the office Ext warm  + neuropathy  + 1st toe amputation  Skin changes to leg consistent with PVD  Skin:    General: Skin is warm.  Neurological:     Mental Status: She is alert and oriented to person, place, and time.     Comments: Did not assess coordination or gait   Psychiatric:         Attention and Perception: Attention normal.        Mood and Affect: Mood normal.        Behavior: Behavior is cooperative.       Assessment/Plan:  63 year old female ground-level fall with numerous medical comorbidities with left trimalleolar ankle fracture dislocation  -fall  - Left trimalleolar ankle fracture dislocation  Patient will need surgical stabilization  Given her numerous comorbidities including diabetes with peripheral neuropathy and inability to mobilize safely and be nonweightbearing we think that it would be best to perform fixation utilizing a hindfoot fusion nail versus formal ORIF.  Anticipate surgical intervention later on this week either Wednesday or Thursday  Nonweightbearing left leg  Okay to start working with therapies  Aggressive ice and elevation, "toes above nose"  - Pain management:  Multimodal  - Medical issues   Per medicine   Hemoglobin A1c was 9.0% October 2024  - DVT/PE prophylaxis:  SQ heparin   - Metabolic Bone Disease:  Check vitamin D   - Activity:  As above  - Dispo:  OR later this week to address left trimalleolar ankle fracture dislocation    Geroldine Kotyk, PA-C [phone] (C) 11/11/2023, 4:13 PM  [facility] [address] [place] [address] [phone] Deanna [phone] (F)    After 5pm and on the weekends please log on to Amion, go to orthopaedics and the look under the [facility] Call for the provider(s) on call. You can also call our office at [phone] and then follow the prompts to be connected to the call team.

## 2023-11-12 ENCOUNTER — Other Ambulatory Visit: Payer: Self-pay

## 2023-11-12 DIAGNOSIS — S82852A Displaced trimalleolar fracture of left lower leg, initial encounter for closed fracture: Secondary | ICD-10-CM | POA: Diagnosis not present

## 2023-11-12 LAB — HEMOGLOBIN A1C
Hgb A1c MFr Bld: 10.2 % — ABNORMAL HIGH (ref 4.8–5.6)
Mean Plasma Glucose: 246.04 mg/dL

## 2023-11-12 LAB — GLUCOSE, CAPILLARY
Glucose-Capillary: 166 mg/dL — ABNORMAL HIGH (ref 70–99)
Glucose-Capillary: 209 mg/dL — ABNORMAL HIGH (ref 70–99)
Glucose-Capillary: 248 mg/dL — ABNORMAL HIGH (ref 70–99)
Glucose-Capillary: 290 mg/dL — ABNORMAL HIGH (ref 70–99)

## 2023-11-12 LAB — COMPREHENSIVE METABOLIC PANEL WITH GFR
ALT: 8 U/L (ref 0–44)
AST: 9 U/L — ABNORMAL LOW (ref 15–41)
Albumin: 2.4 g/dL — ABNORMAL LOW (ref 3.5–5.0)
Alkaline Phosphatase: 100 U/L (ref 38–126)
Anion gap: 12 (ref 5–15)
BUN: 84 mg/dL — ABNORMAL HIGH (ref 6–20)
CO2: 23 mmol/L (ref 22–32)
Calcium: 8.9 mg/dL (ref 8.9–10.3)
Chloride: 99 mmol/L (ref 98–111)
Creatinine, Ser: 2.43 mg/dL — ABNORMAL HIGH (ref 0.44–1.00)
GFR, Estimated: 22 mL/min — ABNORMAL LOW (ref >60)
Glucose, Bld: 174 mg/dL — ABNORMAL HIGH (ref 70–99)
Potassium: 5.1 mmol/L (ref 3.5–5.1)
Sodium: 134 mmol/L — ABNORMAL LOW (ref 135–145)
Total Bilirubin: 0.8 mg/dL (ref 0.0–1.2)
Total Protein: 7.8 g/dL (ref 6.5–8.1)

## 2023-11-12 LAB — CBC
HCT: 27.4 % — ABNORMAL LOW (ref 36.0–46.0)
Hemoglobin: 8.8 g/dL — ABNORMAL LOW (ref 12.0–15.0)
MCH: 26.9 pg (ref 26.0–34.0)
MCHC: 32.1 g/dL (ref 30.0–36.0)
MCV: 83.8 fL (ref 80.0–100.0)
Platelets: 373 10*3/uL (ref 150–400)
RBC: 3.27 MIL/uL — ABNORMAL LOW (ref 3.87–5.11)
RDW: 14.2 % (ref 11.5–15.5)
WBC: 16.6 10*3/uL — ABNORMAL HIGH (ref 4.0–10.5)
nRBC: 0 % (ref 0.0–0.2)

## 2023-11-12 LAB — SURGICAL PCR SCREEN
MRSA, PCR: NEGATIVE
Staphylococcus aureus: NEGATIVE

## 2023-11-12 LAB — VITAMIN D 25 HYDROXY (VIT D DEFICIENCY, FRACTURES): Vit D, 25-Hydroxy: 22.7 ng/mL — ABNORMAL LOW (ref 30–100)

## 2023-11-12 MED ORDER — VITAMIN D 25 MCG (1000 UNIT) PO TABS
2000.0000 [IU] | ORAL_TABLET | Freq: Two times a day (BID) | ORAL | Status: DC
  Administered 2023-11-12 – 2023-11-14 (×3): 2000 [IU] via ORAL
  Filled 2023-11-12 (×3): qty 2

## 2023-11-12 MED ORDER — CEFAZOLIN SODIUM-DEXTROSE 2-4 GM/100ML-% IV SOLN
2.0000 g | INTRAVENOUS | Status: AC
  Administered 2023-11-13: 2 g via INTRAVENOUS
  Filled 2023-11-12: qty 100

## 2023-11-12 MED ORDER — VITAMIN C 500 MG PO TABS
1000.0000 mg | ORAL_TABLET | Freq: Every day | ORAL | Status: DC
  Administered 2023-11-12 – 2023-11-14 (×2): 1000 mg via ORAL
  Filled 2023-11-12 (×2): qty 2

## 2023-11-12 MED ORDER — INSULIN GLARGINE-YFGN 100 UNIT/ML ~~LOC~~ SOLN
30.0000 [IU] | Freq: Every day | SUBCUTANEOUS | Status: DC
  Administered 2023-11-12 – 2023-11-13 (×2): 30 [IU] via SUBCUTANEOUS
  Filled 2023-11-12 (×3): qty 0.3

## 2023-11-12 NOTE — Plan of Care (Signed)
  Problem: Education: Goal: Knowledge of General Education information will improve Description: Including pain rating scale, medication(s)/side effects and non-pharmacologic comfort measures Outcome: Progressing   Problem: Clinical Measurements: Goal: Will remain free from infection Outcome: Progressing Goal: Respiratory complications will improve Outcome: Progressing Goal: Cardiovascular complication will be avoided Outcome: Progressing   Problem: Activity: Goal: Risk for activity intolerance will decrease Outcome: Progressing   Problem: Nutrition: Goal: Adequate nutrition will be maintained Outcome: Progressing   Problem: Coping: Goal: Level of anxiety will decrease Outcome: Progressing   Problem: Pain Managment: Goal: General experience of comfort will improve and/or be controlled Outcome: Progressing   Problem: Safety: Goal: Ability to remain free from injury will improve Outcome: Progressing   Problem: Skin Integrity: Goal: Risk for impaired skin integrity will decrease Outcome: Progressing   Problem: Education: Goal: Ability to describe self-care measures that may prevent or decrease complications (Diabetes Survival Skills Education) will improve Outcome: Progressing Goal: Individualized Educational Video(s) Outcome: Progressing   Problem: Coping: Goal: Ability to adjust to condition or change in health will improve Outcome: Progressing   Problem: Health Behavior/Discharge Planning: Goal: Ability to manage health-related needs will improve Outcome: Progressing   Problem: Nutritional: Goal: Progress toward achieving an optimal weight will improve Outcome: Progressing   Problem: Skin Integrity: Goal: Risk for impaired skin integrity will decrease Outcome: Progressing   Problem: Tissue Perfusion: Goal: Adequacy of tissue perfusion will improve Outcome: Progressing

## 2023-11-12 NOTE — Progress Notes (Signed)
 PROGRESS NOTE    Dana [name]  ZOX:[id number] DOB: 12-05-1960 DOA: 11/11/2023 PCP: Mercy Stall, MD   Brief Narrative:  Dana [name] is a 63 y.o. female with medical history significant of hypertension, hyperlipidemia, atrial fibrillation status post ablation, CAD, GERD, CKD 4, diabetes, neuropathy, chronic combined systolic and diastolic CHF, anxiety, depression, obesity, OSA, chronic respiratory failure with hypoxia, anemia, cardiac arrest, chronic pain, toe amputation, gout was directly admitted from Ortho clinic for surgical fixation of the left trimalleolar ankle fracture and dislocation.   Patient was initially seen in the ED on 4/22 due to a fall when getting out of her recliner with a twisting motion.  She was diagnosed with trimalleolar fracture which was reduced and splinted and then patient was discharged with orthopedic follow-up.   Since that time patient has had trouble with maintaining nonweightbearing.  After reevaluating in orthopedic office 11/11/2023, decision was made to directly admit patient for surgical correction.    Assessment & Plan:   Principal Problem:   Trimalleolar fracture of ankle, closed Active Problems:   Anemia in CKD (chronic kidney disease)   Chronic pain syndrome   Diabetic polyneuropathy associated with type 2 diabetes mellitus (HCC)   GERD without esophagitis   Hyperlipidemia LDL goal <70   S/P ablation of atrial fibrillation   Class 2 severe obesity due to excess calories with serious comorbidity and body mass index (BMI) of 39.0 to 39.9 in adult [facility]   Hypertensive heart and renal disease with congestive heart failure (HCC)   Lumbar back pain   Chronic respiratory failure with hypoxia (HCC)   Chronic gout without tophus   OSA on CPAP   Insulin  dependent type 2 diabetes mellitus (HCC)   S/P mitral valve clip implantation   GAD (generalized anxiety disorder)   Mixed hyperlipidemia   Unstable angina (HCC)   CKD stage 4 due to type 1  diabetes mellitus (HCC)   History of amputation of hallux (HCC)   Moderate recurrent major depression (HCC)   Coronary artery disease involving native coronary artery of native heart with angina pectoris (HCC)  Trimalleolar ankle fracture, left, POA: > Patient presenting from orthopedic office for surgical correction of left trimalleolar ankle fracture.  Please see details above.  Orthopedics on board, plan for surgical repair either Wednesday or Thursday.  Management per them.   CAD She has had extensive CAD with prior PCI's and multiple evidence of in-stent restenosis in the proximal LAD with last intervention January 2025.   - scoring balloon angioplasty mid LAD 07/2023 - she remains on DAPT with ASA and brilinta   - continue coreg , ASA -inpatient cardiology team reaching out to IC team for brilinta  recommendations.  Appreciate their help.   Chronic combined systolic and diastolic CHF / Status post MitraClip: > Last echo was in February of this year with EF 40 to 45%, indeterminate diastolic function, poorly visualized RV function.  Status post MitraClip.  Some wall motion abnormality was noted. > Certainly there is a ischemic component to this given the above. > Follows with advanced heart failure clinic - Continue home torsemide , carvedilol , Imdur    Hypertension - Blood pressure very well-controlled, continue home torsemide , carvedilol , Imdur    Hyperlipidemia - Continue Zetia  - On Repatha  outpatient   Atrial fibrillation > Status post ablation - Not currently on anticoagulation - Is taking Coreg  for comorbid conditions   GERD - Continue PPI   CKD 4 - Baseline creatinine between 2-2.6.  Currently at baseline.  Trend renal function  and electrolytes   Diabetes melitis type II,: > Last hemoglobin A1c 10.2 on 11/12/2023.  On 33-35 units long-acting outpatient and low-dose with meals.  Currently on 25 units Semglee  and SSI.  Slightly hyperglycemic.  Will increase Semglee  to 30  units.    Neuropathy - Continue home gabapentin    Anxiety Depression - Continue home doxepin  and as needed Ativan    Obesity class III, POA: - BMI 36.  Weight loss and diet modification counseled.   OSA - Continue CPAP   Chronic respiratory failure with hypoxia - On chronic 2 L   Anemia - Trend CBC   Gout - Continue home allopurinol    History of cardiac arrest in 2022 History of toe amputation - Noted   DVT prophylaxis: heparin  injection 5,000 Units Start: 11/11/23 2200   Code Status: Full Code  Family Communication:  None present at bedside.  Plan of care discussed with patient in length and he/she verbalized understanding and agreed with it.  Status is: Inpatient Remains inpatient appropriate because: Patient is scheduled for orthopedic surgery tomorrow or day after.   Estimated body mass index is 35.62 kg/m as calculated from the following:   Height as of this encounter: 5\' 5"  (1.651 m).   Weight as of this encounter: 97.1 kg.    Nutritional Assessment: Body mass index is 35.62 kg/m.Aaron Aas Seen by dietician.  I agree with the assessment and plan as outlined below: Nutrition Status:        . Skin Assessment: I have examined the patient's skin and I agree with the wound assessment as performed by the wound care RN as outlined below:    Consultants:  Orthopedic and cardiology  Procedures:  As above  Antimicrobials:  Anti-infectives (From admission, onward)    None         Subjective: Patient seen and examined, although she still has left lower extremity pain but she feels better than yesterday.  No other complaint.  Objective: Vitals:   11/11/23 2325 11/12/23 0044 11/12/23 0345 11/12/23 0416  BP:  (!) 146/51 (!) 153/67 (!) 153/67  Pulse: 66 69 80 78  Resp: 18 18 17 16   Temp:  97.9 F (36.6 C) 98.6 F (37 C) 98.4 F (36.9 C)  TempSrc:    Oral  SpO2: 95% 98% 94%   Weight:    97.1 kg  Height:    5\' 5"  (1.651 m)    Intake/Output Summary  (Last 24 hours) at 11/12/2023 0846 Last data filed at 11/12/2023 0650 Gross per 24 hour  Intake --  Output 1100 ml  Net -1100 ml   Filed Weights   11/12/23 0416  Weight: 97.1 kg    Examination:  General exam: Appears calm and comfortable, obese Respiratory system: Clear to auscultation. Respiratory effort normal. Cardiovascular system: S1 & S2 heard, RRR. No JVD, murmurs, rubs, gallops or clicks. No pedal edema. Gastrointestinal system: Abdomen is nondistended, soft and nontender. No organomegaly or masses felt. Normal bowel sounds heard. Central nervous system: Alert and oriented. No focal neurological deficits. Extremities: Left lower extremity wrapped in the Ace wrap. Psychiatry: Judgement and insight appear normal. Mood & affect appropriate.    Data Reviewed: I have personally reviewed following labs and imaging studies  CBC: Recent Labs  Lab 11/11/23 1512 11/12/23 0619  WBC 15.3* 16.6*  HGB 8.3* 8.8*  HCT 26.1* 27.4*  MCV 84.7 83.8  PLT 340 373   Basic Metabolic Panel: Recent Labs  Lab 11/11/23 1512 11/12/23 0619  NA 132* 134*  K 4.5 5.1  CL 97* 99  CO2 24 23  GLUCOSE 301* 174*  BUN 88* 84*  CREATININE 2.57* 2.43*  CALCIUM  8.7* 8.9  MG 1.8  --    GFR: Estimated Creatinine Clearance: 28.4 mL/min (A) (by C-G formula based on SCr of 2.43 mg/dL (H)). Liver Function Tests: Recent Labs  Lab 11/11/23 1512 11/12/23 0619  AST 9* 9*  ALT 8 8  ALKPHOS 98 100  BILITOT 0.8 0.8  PROT 7.3 7.8  ALBUMIN  2.3* 2.4*   No results for input(s): "LIPASE", "AMYLASE" in the last 168 hours. No results for input(s): "AMMONIA" in the last 168 hours. Coagulation Profile: No results for input(s): "INR", "PROTIME" in the last 168 hours. Cardiac Enzymes: No results for input(s): "CKTOTAL", "CKMB", "CKMBINDEX", "TROPONINI" in the last 168 hours. BNP (last 3 results) No results for input(s): "PROBNP" in the last 8760 hours. HbA1C: Recent Labs    11/12/23 0619  HGBA1C  10.2*   CBG: Recent Labs  Lab 11/11/23 1632 11/11/23 2201 11/12/23 0602  GLUCAP 318* 225* 166*   Lipid Profile: No results for input(s): "CHOL", "HDL", "LDLCALC", "TRIG", "CHOLHDL", "LDLDIRECT" in the last 72 hours. Thyroid  Function Tests: No results for input(s): "TSH", "T4TOTAL", "FREET4", "T3FREE", "THYROIDAB" in the last 72 hours. Anemia Panel: No results for input(s): "VITAMINB12", "FOLATE", "FERRITIN", "TIBC", "IRON ", "RETICCTPCT" in the last 72 hours. Sepsis Labs: No results for input(s): "PROCALCITON", "LATICACIDVEN" in the last 168 hours.  No results found for this or any previous visit (from the past 240 hours).   Radiology Studies: No results found.  Scheduled Meds:  allopurinol   50 mg Oral Daily   aspirin  EC  81 mg Oral Daily   carvedilol   9.375 mg Oral BID   doxepin   150 mg Oral QHS   ezetimibe   10 mg Oral QPM   gabapentin   300 mg Oral TID   heparin   5,000 Units Subcutaneous Q8H   insulin  aspart  0-15 Units Subcutaneous TID WC   insulin  glargine-yfgn  25 Units Subcutaneous Daily   isosorbide  mononitrate  30 mg Oral Daily   latanoprost   1 drop Both Eyes QHS   pantoprazole   40 mg Oral Daily   sodium chloride  flush  3 mL Intravenous Q12H   torsemide   60 mg Oral Daily   Continuous Infusions:   LOS: 1 day   Modena Andes, MD [facility]  11/12/2023, 8:46 AM   *Please note that this is a verbal dictation therefore any spelling or grammatical errors are due to the "Dragon [employer] One" system interpretation.  Please page via Amion and do not message via secure chat for urgent patient care matters. Secure chat can be used for non urgent patient care matters.  How to contact the [facility] Attending or Consulting provider 7A - 7P or covering provider during after hours 7P -7A, for this patient?  Check the care team in [facility] and look for a) attending/consulting [facility] provider listed and b) the [facility] team listed. Page or secure chat 7A-7P. Log into [email or web address] and  use [facility] universal password to access. If you do not have the password, please contact the hospital operator. Locate the [facility] provider you are looking for under [facility] and page to a number that you can be directly reached. If you still have difficulty reaching the provider, please page the [facility] (Director on Call) for the Hospitalists listed on amion for assistance.

## 2023-11-12 NOTE — Inpatient Diabetes Management (Signed)
 Inpatient Diabetes Program Recommendations  AACE/ADA: New Consensus Statement on Inpatient Glycemic Control (2015)  Target Ranges:  Prepandial:   less than 140 mg/dL      Peak postprandial:   less than 180 mg/dL (1-2 hours)      Critically ill patients:  140 - 180 mg/dL   Lab Results  Component Value Date   GLUCAP 209 (H) 11/12/2023   HGBA1C 10.2 (H) 11/12/2023    Review of Glycemic Control  Latest Reference Range & Units 11/11/23 16:32 11/11/23 22:01 11/12/23 06:02 11/12/23 11:16  Glucose-Capillary 70 - 99 mg/dL 191 (H) 478 (H) 295 (H) 209 (H)   Diabetes history: DM 2 Outpatient Diabetes medications: Lantus  30 units Daily, Novolog  2-10 tid Current orders for Inpatient glycemic control:  Semglee  30 units Daily Novolog  0-15 units tid  A1c 10.2% on 4/29  -   Consider Novolog  4 units tid meal coverage if eating >50% of meals  Spoke with pt at bedside regarding A1c level of 10.2% and glucose control at home. Pt reports usually seeing her Endocrinologist every 3 months but is often rescheduled by the office so at times she has longer periods of time between appointments. Pt reports not seeing Dr. Felipe Horton (Dr. Katina Parlor, Endocrinologist) in over 5 months. Pt reports receiving a steroid shot within the last 1-2 months due to pain. Pt also reports her glucose trends are mostly in the 200 range. Stressed importance of glucose control on wound heeling. Encouraged her to schedule appointment with Dr. Felipe Horton ASAP for insulin  adjustments to ensure proper wound healing. Discussed glucose and A1c goals. Pt reports having all needed supplies at home.   Thanks,  Dana Hake RN, MSN, BC-ADM Inpatient Diabetes Coordinator Team Pager [phone] (8a-5p)

## 2023-11-12 NOTE — Evaluation (Signed)
 Physical Therapy Evaluation Patient Details Name: Dana [name] MRN: [id number] DOB: 1961-02-14 Today's Date: 11/12/2023  History of Present Illness  Pt is a 63 y/o female presenting on 4/28 after ground level fall on 4/22. Found with L trimalleolar ankle fx in ED on 4/22 and presented to orthopedic office on 4/28 with dislocation of L ankle as unable to comply with NWB restrictions. Likely surgery later this week.   PMH of diastolic heart failure, anoxic brain injury, COPD, RA, CVA, CAD, DES RCA/LAD 2018, DMI with peripheral neuropathy, HTN, PAD, L toe amputation, and OSA.  Clinical Impression   Pt presents with LLE pain, impaired balance with history of falls, difficulty with NWB LLE, and impaired activity tolerance. Pt to benefit from acute PT to address deficits. Pt requiring light assist for transfer-level mobility, pt does best with scoot or squat pivots at this time given neuropathy and difficulty maintaining NWB LLE. PT anticipates good functional recovery for transfer-level mobility post-acutely, would benefit from [facility] services to address deficits and optimize home set up for pt safety. PT to progress mobility as tolerated, and will continue to follow acutely.          If plan is discharge home, recommend the following: A little help with walking and/or transfers;A little help with bathing/dressing/bathroom   Can travel by private vehicle        Equipment Recommendations None recommended by PT  Recommendations for Other Services       Functional Status Assessment Patient has had a recent decline in their functional status and demonstrates the ability to make significant improvements in function in a reasonable and predictable amount of time.     Precautions / Restrictions Precautions Precautions: Fall Restrictions Weight Bearing Restrictions Per Provider Order: Yes LLE Weight Bearing Per Provider Order: Non weight bearing      Mobility  Bed Mobility Overal bed mobility:  Needs Assistance Bed Mobility: Supine to Sit     Supine to sit: Supervision     General bed mobility comments: increased time, use of bedrails    Transfers Overall transfer level: Needs assistance Equipment used: Rolling walker (2 wheels) Transfers: Bed to chair/wheelchair/BSC, Sit to/from Stand Sit to Stand: Min assist, +2 safety/equipment     Squat pivot transfers: Min assist     General transfer comment: assist for set up, steadying. stand from recliner x1 with use of RW and rise assist, pt able to pivot on RLE x1 but difficulty with NWB LLE.    Ambulation/Gait                  Stairs            Wheelchair Mobility     Tilt Bed    Modified Rankin (Stroke Patients Only)       Balance Overall balance assessment: Needs assistance, History of Falls Sitting-balance support: Feet supported, Bilateral upper extremity supported Sitting balance-Leahy Scale: Good     Standing balance support: Bilateral upper extremity supported, During functional activity, Reliant on assistive device for balance Standing balance-Leahy Scale: Poor                               Pertinent Vitals/Pain Pain Assessment Pain Assessment: 0-10 Pain Score: 6  Pain Location: lower back, L foot Pain Descriptors / Indicators: Sore, Throbbing Pain Intervention(s): Limited activity within patient's tolerance, Monitored during session, Repositioned    Home Living Family/patient expects to be discharged to::  Private residence Living Arrangements: Spouse/significant other;Parent (husband, mom) Available Help at Discharge: Family (husband works, mother is there all the time) Type of Home: House Home Access: Ramped entrance       Home Layout: Able to live on main level with bedroom/bathroom Home Equipment: Rolling Walker (2 wheels);Shower seat;Grab bars - tub/shower;Hand held shower head;Cane - single point;Wheelchair - manual;BSC/3in1      Prior Function Prior Level  of Function : Needs assist;History of Falls (last six months)             Mobility Comments: furniture surfs inside the home, since hurting her ankle pt has been sleeping in lift chair recliner ADLs Comments: has been using BSC since ankle injury     Extremity/Trunk Assessment   Upper Extremity Assessment Upper Extremity Assessment: Defer to OT evaluation    Lower Extremity Assessment Lower Extremity Assessment: Generalized weakness;LLE deficits/detail LLE: Unable to fully assess due to immobilization LLE Sensation: history of peripheral neuropathy    Cervical / Trunk Assessment Cervical / Trunk Assessment: Normal  Communication   Communication Communication: No apparent difficulties    Cognition Arousal: Alert Behavior During Therapy: WFL for tasks assessed/performed   PT - Cognitive impairments: No apparent impairments                         Following commands: Intact       Cueing Cueing Techniques: Verbal cues, Gestural cues     General Comments      Exercises     Assessment/Plan    PT Assessment Patient needs continued PT services  PT Problem List Decreased strength;Decreased mobility;Decreased activity tolerance;Decreased balance;Decreased knowledge of use of DME;Pain       PT Treatment Interventions Therapeutic activities;DME instruction;Therapeutic exercise;Patient/family education;Balance training;Functional mobility training;Neuromuscular re-education;Gait training    PT Goals (Current goals can be found in the Care Plan section)  Acute Rehab PT Goals PT Goal Formulation: With patient Time For Goal Achievement: 11/26/23 Potential to Achieve Goals: Good    Frequency Min 1X/week     Co-evaluation               AM-PAC PT "6 Clicks" Mobility  Outcome Measure Help needed turning from your back to your side while in a flat bed without using bedrails?: A Little Help needed moving from lying on your back to sitting on the side  of a flat bed without using bedrails?: A Little Help needed moving to and from a bed to a chair (including a wheelchair)?: A Little Help needed standing up from a chair using your arms (e.g., wheelchair or bedside chair)?: A Little Help needed to walk in hospital room?: Total Help needed climbing 3-5 steps with a railing? : Total 6 Click Score: 14    End of Session   Activity Tolerance: Patient limited by fatigue;Patient limited by pain Patient left: in chair;with call bell/phone within reach;with chair alarm set Nurse Communication: Mobility status PT Visit Diagnosis: Other abnormalities of gait and mobility (R26.89);Muscle weakness (generalized) (M62.81)    Time: 8295-6213 PT Time Calculation (min) (ACUTE ONLY): 13 min   Charges:   PT Evaluation $PT Eval Low Complexity: 1 Low   PT General Charges $$ ACUTE PT VISIT: 1 Visit         Shirlene Doughty, PT DPT Acute Rehabilitation Services Secure Chat Preferred  Office [phone]   Danyla Wattley E Burnadette Carrion 11/12/2023, 1:27 PM

## 2023-11-12 NOTE — Evaluation (Signed)
 Occupational Therapy Evaluation Patient Details Name: Dana [name] MRN: [id number] DOB: Nov 11, 1960 Today's Date: 11/12/2023   History of Present Illness   Pt is a 63 y/o female presenting on 4/28 after ground level fall on 4/22. Found with L trimalleolar ankle fx in ED on 4/22 and presented to orthopedic office on 4/28 with dislocation of L ankle as unable to comply with NWB restrictions. Likely surgery later this week.   PMH of diastolic heart failure, anoxic brain injury, COPD, RA, CVA, CAD, DES RCA/LAD 2018, DMI with peripheral neuropathy, HTN, PAD, L toe amputation, and OSA.     Clinical Impressions Patient admitted for above and presents with problem list below.  She reports managing ADLs and mobility (furniture walking) prior to ankle injury, but since confined to lift chair to bsc transfers and needing assist with ADLs.  Today, pt requires min assist for lateral scoot transfers, setup to mod assist for ADLs.  Poor ability to keep  LLE NWB with standing trial given +2 min assist. Anticipate she will be wheelchair level at dc, but will follow up post surgery.  Based on performance today, pt will best benefit from continued OT services acutely and after dc at [facility] level to optimize independence and safety with ADLs, IADLs and mobility.      If plan is discharge home, recommend the following:   A little help with walking and/or transfers;A lot of help with bathing/dressing/bathroom;Assistance with cooking/housework;Assist for transportation;Help with stairs or ramp for entrance     Functional Status Assessment   Patient has had a recent decline in their functional status and demonstrates the ability to make significant improvements in function in a reasonable and predictable amount of time.     Equipment Recommendations   BSC/3in1 (DROP ARM)     Recommendations for Other Services         Precautions/Restrictions   Precautions Precautions: Fall Recall of  Precautions/Restrictions: Intact Restrictions Weight Bearing Restrictions Per Provider Order: Yes LLE Weight Bearing Per Provider Order: Non weight bearing     Mobility Bed Mobility Overal bed mobility: Needs Assistance Bed Mobility: Supine to Sit     Supine to sit: Supervision     General bed mobility comments: increased time, use of bedrails    Transfers Overall transfer level: Needs assistance Equipment used: Rolling walker (2 wheels) Transfers: Bed to chair/wheelchair/BSC, Sit to/from Stand Sit to Stand: Min assist, +2 safety/equipment          Lateral/Scoot Transfers: Min assist General transfer comment: assist for set up, lateral scoot to recliner towards R side with support to keep L LE NWB. stand from recliner x1 with use of RW and rise assist, pt able to pivot on RLE x1 but difficulty with NWB LLE.      Balance Overall balance assessment: Needs assistance, History of Falls Sitting-balance support: Feet supported, Bilateral upper extremity supported Sitting balance-Leahy Scale: Good     Standing balance support: Bilateral upper extremity supported, During functional activity, Reliant on assistive device for balance Standing balance-Leahy Scale: Poor                             ADL either performed or assessed with clinical judgement   ADL Overall ADL's : Needs assistance/impaired     Grooming: Set up;Sitting           Upper Body Dressing : Set up;Sitting   Lower Body Dressing: Moderate assistance;Sitting/lateral leans Lower Body Dressing  Details (indicate cue type and reason): able to don R sock, lateral leans to simulate dressing with increased assist Toilet Transfer: Minimal assistance Toilet Transfer Details (indicate cue type and reason): lateral scoot to recliner         Functional mobility during ADLs: Minimal assistance;Cueing for safety;Cueing for sequencing       Vision   Vision Assessment?: No apparent visual deficits      Perception         Praxis         Pertinent Vitals/Pain Pain Assessment Pain Assessment: 0-10 Pain Score: 6  Pain Location: lower back, L foot Pain Descriptors / Indicators: Sore, Throbbing Pain Intervention(s): Limited activity within patient's tolerance, Monitored during session, Repositioned     Extremity/Trunk Assessment Upper Extremity Assessment Upper Extremity Assessment: Overall WFL for tasks assessed   Lower Extremity Assessment Lower Extremity Assessment: Defer to PT evaluation LLE: Unable to fully assess due to immobilization LLE Sensation: history of peripheral neuropathy   Cervical / Trunk Assessment Cervical / Trunk Assessment: Normal   Communication Communication Communication: No apparent difficulties   Cognition Arousal: Alert Behavior During Therapy: WFL for tasks assessed/performed Cognition: No apparent impairments                               Following commands: Intact       Cueing  General Comments   Cueing Techniques: Verbal cues;Gestural cues      Exercises     Shoulder Instructions      Home Living Family/patient expects to be discharged to:: Private residence Living Arrangements: Spouse/significant other;Parent (husband, mom) Available Help at Discharge: Family (husband works, mother is there all times) Type of Home: House Home Access: Ramped entrance     Home Layout: Able to live on main level with bedroom/bathroom     Bathroom Shower/Tub: Producer, television/film/video: Standard     Home Equipment: Agricultural consultant (2 wheels);Shower seat;Grab bars - tub/shower;Hand held shower head;Cane - single point;Wheelchair - manual;BSC/3in1          Prior Functioning/Environment Prior Level of Function : Needs assist;History of Falls (last six months)             Mobility Comments: furniture surfs inside the home, since hurting her ankle pt has been sleeping in lift chair recliner ADLs Comments: has  been using BSC since ankle injury, needing assist for LB dressing since ankle injury but typically manages ADls and light IADLs    OT Problem List: Decreased strength;Decreased activity tolerance;Impaired balance (sitting and/or standing);Pain;Obesity;Decreased knowledge of precautions;Decreased knowledge of use of DME or AE;Decreased safety awareness   OT Treatment/Interventions: Self-care/ADL training;Therapeutic exercise;DME and/or AE instruction;Therapeutic activities;Patient/family education;Balance training      OT Goals(Current goals can be found in the care plan section)   Acute Rehab OT Goals Patient Stated Goal: less pain, move better OT Goal Formulation: With patient Time For Goal Achievement: 11/26/23 Potential to Achieve Goals: Good   OT Frequency:  Min 2X/week    Co-evaluation              AM-PAC OT "6 Clicks" Daily Activity     Outcome Measure Help from another person eating meals?: None Help from another person taking care of personal grooming?: A Little Help from another person toileting, which includes using toliet, bedpan, or urinal?: A Lot Help from another person bathing (including washing, rinsing, drying)?: A Lot Help from another person  to put on and taking off regular upper body clothing?: A Little Help from another person to put on and taking off regular lower body clothing?: A Lot 6 Click Score: 16   End of Session Equipment Utilized During Treatment: Rolling walker (2 wheels) Nurse Communication: Mobility status  Activity Tolerance: Patient tolerated treatment well Patient left: in chair;with call bell/phone within reach;with chair alarm set  OT Visit Diagnosis: Other abnormalities of gait and mobility (R26.89);Muscle weakness (generalized) (M62.81);Pain Pain - Right/Left: Left Pain - part of body: Ankle and joints of foot                Time: 1131-1145 OT Time Calculation (min): 14 min Charges:  OT General Charges $OT Visit: 1 Visit OT  Evaluation $OT Eval Low Complexity: 1 Low  Bary Boss, OT Acute Rehabilitation Services Office [phone] Secure Chat Preferred    Fredrich Jefferson 11/12/2023, 1:52 PM

## 2023-11-12 NOTE — Plan of Care (Signed)

## 2023-11-12 NOTE — Progress Notes (Cosign Needed Addendum)
 Orthopaedic Trauma Service Progress Note  Patient ID: Dana [name] MRN: [id number] DOB/AGE: [birth year] 61 y.o.  Subjective:  Doing ok  Nervous about surgery tomorrow  Uses cane at baseline  Poor balance  Significant peripheral neuropathy is likely contributory to the loss of reduction of her ankle fracture   I do think that after a few more therapy sessions she should be able to manage at home   A1c this admission is 10.2%   ROS As above Today's  total administered Morphine  Milligram Equivalents: 15 Yesterday's total administered Morphine  Milligram Equivalents: 7.5  Objective:   VITALS:   Vitals:   11/12/23 0345 11/12/23 0416 11/12/23 0844 11/12/23 1539  BP: (!) 153/67 (!) 153/67 (!) 119/44 (!) 112/49  Pulse: 80 78 78 68  Resp: 17 16 16 17   Temp: 98.6 F (37 C) 98.4 F (36.9 C) 98.4 F (36.9 C) (!) 97.4 F (36.3 C)  TempSrc:  Oral Oral   SpO2: 94%  90% 95%  Weight:  97.1 kg    Height:  5\' 5"  (1.651 m)      Estimated body mass index is 35.62 kg/m as calculated from the following:   Height as of this encounter: 5\' 5"  (1.651 m).   Weight as of this encounter: 97.1 kg.   Intake/Output      04/28 0701 04/29 0700 04/29 0701 04/30 0700   P.O.  480   Total Intake(mL/kg)  480 (4.9)   Urine (mL/kg/hr) 1100    Total Output 1100    Net -1100 +480          LABS  Results for orders placed or performed during the hospital encounter of 11/11/23 (from the past 24 hours)  Glucose, capillary     Status: Abnormal   Collection Time: 11/11/23 10:01 PM  Result Value Ref Range   Glucose-Capillary 225 (H) 70 - 99 mg/dL  Glucose, capillary     Status: Abnormal   Collection Time: 11/12/23  6:02 AM  Result Value Ref Range   Glucose-Capillary 166 (H) 70 - 99 mg/dL  Comprehensive metabolic panel     Status: Abnormal   Collection Time: 11/12/23  6:19 AM  Result Value Ref Range   Sodium 134 (L)  135 - 145 mmol/L   Potassium 5.1 3.5 - 5.1 mmol/L   Chloride 99 98 - 111 mmol/L   CO2 23 22 - 32 mmol/L   Glucose, Bld 174 (H) 70 - 99 mg/dL   BUN 84 (H) 6 - 20 mg/dL   Creatinine, Ser 4.09 (H) 0.44 - 1.00 mg/dL   Calcium  8.9 8.9 - 10.3 mg/dL   Total Protein 7.8 6.5 - 8.1 g/dL   Albumin  2.4 (L) 3.5 - 5.0 g/dL   AST 9 (L) 15 - 41 U/L   ALT 8 0 - 44 U/L   Alkaline Phosphatase 100 38 - 126 U/L   Total Bilirubin 0.8 0.0 - 1.2 mg/dL   GFR, Estimated 22 (L) >60 mL/min   Anion gap 12 5 - 15  CBC     Status: Abnormal   Collection Time: 11/12/23  6:19 AM  Result Value Ref Range   WBC 16.6 (H) 4.0 - 10.5 K/uL   RBC 3.27 (L) 3.87 - 5.11 MIL/uL   Hemoglobin 8.8 (L) 12.0 - 15.0 g/dL   HCT 81.1 (  L) 36.0 - 46.0 %   MCV 83.8 80.0 - 100.0 fL   MCH 26.9 26.0 - 34.0 pg   MCHC 32.1 30.0 - 36.0 g/dL   RDW 40.9 81.1 - 91.4 %   Platelets 373 150 - 400 K/uL   nRBC 0.0 0.0 - 0.2 %  VITAMIN D  25 Hydroxy (Vit-D Deficiency, Fractures)     Status: Abnormal   Collection Time: 11/12/23  6:19 AM  Result Value Ref Range   Vit D, 25-Hydroxy 22.70 (L) 30 - 100 ng/mL  Hemoglobin A1c     Status: Abnormal   Collection Time: 11/12/23  6:19 AM  Result Value Ref Range   Hgb A1c MFr Bld 10.2 (H) 4.8 - 5.6 %   Mean Plasma Glucose 246.04 mg/dL  Glucose, capillary     Status: Abnormal   Collection Time: 11/12/23 11:16 AM  Result Value Ref Range   Glucose-Capillary 209 (H) 70 - 99 mg/dL  Glucose, capillary     Status: Abnormal   Collection Time: 11/12/23  4:25 PM  Result Value Ref Range   Glucose-Capillary 248 (H) 70 - 99 mg/dL   *Note: Due to a large number of results and/or encounters for the requested time period, some results have not been displayed. A complete set of results can be found in Results Review.     PHYSICAL EXAM:   Gen: sitting up in bedside chair, daughter at bedside  Lungs: unlabored Ext:       Left Lower Extremity   SLS in place  No acute changes in exam   No first toe due to  amputation   + neuropathy     Assessment/Plan:     Principal Problem:   Trimalleolar fracture of ankle, closed Active Problems:   Anemia in CKD (chronic kidney disease)   Chronic pain syndrome   Diabetic polyneuropathy associated with type 2 diabetes mellitus (HCC)   GERD without esophagitis   Hyperlipidemia LDL goal <70   S/P ablation of atrial fibrillation   Class 2 severe obesity due to excess calories with serious comorbidity and body mass index (BMI) of 39.0 to 39.9 in adult [facility])   Hypertensive heart and renal disease with congestive heart failure (HCC)   Lumbar back pain   Chronic respiratory failure with hypoxia (HCC)   Chronic gout without tophus   OSA on CPAP   Insulin  dependent type 2 diabetes mellitus (HCC)   S/P mitral valve clip implantation   GAD (generalized anxiety disorder)   Mixed hyperlipidemia   Unstable angina (HCC)   CKD stage 4 due to type 1 diabetes mellitus (HCC)   History of amputation of hallux (HCC)   Moderate recurrent major depression (HCC)   Coronary artery disease involving native coronary artery of native heart with angina pectoris (HCC)   Anti-infectives (From admission, onward)    None     .  63 year old female ground-level fall with numerous medical comorbidities with left trimalleolar ankle fracture dislocation   -fall   - Left trimalleolar ankle fracture dislocation               OR tomorrow for TTC fusion nail     NWB x 6-8 weeks     At risk for complications due to neuropathy, poorly controlled DM (increases infection and nonunion risk)   - Pain management:               Multimodal   - Medical issues  Per medicine                 Hemoglobin A1c this admission is 10.2%   - DVT/PE prophylaxis:               SQ heparin    - Metabolic Bone Disease:               Vitamin d  insufficiency    Supplement    - Activity:               As above   - Dispo:               OR tomorrow to address L ankle fracture  dislocation    Geroldine Kotyk, PA-C [phone] (C) 11/12/2023, 5:54 PM  [facility] [address] [place] [address] [phone] Deanna [phone] (F)    After 5pm and on the weekends please log on to Amion, go to orthopaedics and the look under the [facility] Call for the provider(s) on call. You can also call our office at [phone] and then follow the prompts to be connected to the call team.  Patient ID: Dana [name], female   DOB: 1961-06-06, 63 y.o.   MRN: [id number]

## 2023-11-13 ENCOUNTER — Other Ambulatory Visit: Payer: Self-pay

## 2023-11-13 ENCOUNTER — Inpatient Hospital Stay (HOSPITAL_COMMUNITY)

## 2023-11-13 ENCOUNTER — Encounter (HOSPITAL_COMMUNITY)
Admission: AD | Disposition: A | Discharge status: Home Health | Payer: Self-pay | Source: Ambulatory Visit | Attending: Family Medicine

## 2023-11-13 ENCOUNTER — Encounter (HOSPITAL_COMMUNITY): Payer: Self-pay | Admitting: Internal Medicine

## 2023-11-13 DIAGNOSIS — I509 Heart failure, unspecified: Secondary | ICD-10-CM

## 2023-11-13 DIAGNOSIS — I110 Hypertensive heart disease with heart failure: Secondary | ICD-10-CM | POA: Diagnosis not present

## 2023-11-13 DIAGNOSIS — S82892A Other fracture of left lower leg, initial encounter for closed fracture: Secondary | ICD-10-CM

## 2023-11-13 DIAGNOSIS — I2510 Atherosclerotic heart disease of native coronary artery without angina pectoris: Secondary | ICD-10-CM | POA: Diagnosis not present

## 2023-11-13 HISTORY — PX: ANKLE FUSION: SHX881

## 2023-11-13 LAB — CBC WITH DIFFERENTIAL/PLATELET
Abs Immature Granulocytes: 0.1 10*3/uL — ABNORMAL HIGH (ref 0.00–0.07)
Basophils Absolute: 0.1 10*3/uL (ref 0.0–0.1)
Basophils Relative: 1 %
Eosinophils Absolute: 0.4 10*3/uL (ref 0.0–0.5)
Eosinophils Relative: 3 %
HCT: 24.9 % — ABNORMAL LOW (ref 36.0–46.0)
Hemoglobin: 7.9 g/dL — ABNORMAL LOW (ref 12.0–15.0)
Immature Granulocytes: 1 %
Lymphocytes Relative: 12 %
Lymphs Abs: 1.6 10*3/uL (ref 0.7–4.0)
MCH: 26.7 pg (ref 26.0–34.0)
MCHC: 31.7 g/dL (ref 30.0–36.0)
MCV: 84.1 fL (ref 80.0–100.0)
Monocytes Absolute: 0.9 10*3/uL (ref 0.1–1.0)
Monocytes Relative: 6 %
Neutro Abs: 10.8 10*3/uL — ABNORMAL HIGH (ref 1.7–7.7)
Neutrophils Relative %: 77 %
Platelets: 321 10*3/uL (ref 150–400)
RBC: 2.96 MIL/uL — ABNORMAL LOW (ref 3.87–5.11)
RDW: 14.3 % (ref 11.5–15.5)
WBC: 13.8 10*3/uL — ABNORMAL HIGH (ref 4.0–10.5)
nRBC: 0 % (ref 0.0–0.2)

## 2023-11-13 LAB — GLUCOSE, CAPILLARY
Glucose-Capillary: 165 mg/dL — ABNORMAL HIGH (ref 70–99)
Glucose-Capillary: 184 mg/dL — ABNORMAL HIGH (ref 70–99)
Glucose-Capillary: 173 mg/dL — ABNORMAL HIGH (ref 70–99)
Glucose-Capillary: 194 mg/dL — ABNORMAL HIGH (ref 70–99)
Glucose-Capillary: 220 mg/dL — ABNORMAL HIGH (ref 70–99)
Glucose-Capillary: 434 mg/dL — ABNORMAL HIGH (ref 70–99)
Glucose-Capillary: 445 mg/dL — ABNORMAL HIGH (ref 70–99)

## 2023-11-13 LAB — BASIC METABOLIC PANEL WITH GFR
Anion gap: 11 (ref 5–15)
BUN: 86 mg/dL — ABNORMAL HIGH (ref 6–20)
CO2: 23 mmol/L (ref 22–32)
Calcium: 8.4 mg/dL — ABNORMAL LOW (ref 8.9–10.3)
Chloride: 99 mmol/L (ref 98–111)
Creatinine, Ser: 2.58 mg/dL — ABNORMAL HIGH (ref 0.44–1.00)
GFR, Estimated: 21 mL/min — ABNORMAL LOW (ref >60)
Glucose, Bld: 175 mg/dL — ABNORMAL HIGH (ref 70–99)
Potassium: 4.4 mmol/L (ref 3.5–5.1)
Sodium: 133 mmol/L — ABNORMAL LOW (ref 135–145)

## 2023-11-13 LAB — TYPE AND SCREEN
ABO/RH(D): A NEG
Antibody Screen: NEGATIVE

## 2023-11-13 SURGERY — ARTHRODESIS ANKLE
Anesthesia: General | Site: Ankle | Laterality: Left

## 2023-11-13 MED ORDER — ONDANSETRON HCL 4 MG/2ML IJ SOLN: 4.0000 mg | Freq: Four times a day (QID) | INTRAMUSCULAR | Status: DC | PRN

## 2023-11-13 MED ORDER — 0.9 % SODIUM CHLORIDE (POUR BTL) OPTIME
TOPICAL | Status: DC | PRN
  Administered 2023-11-13: 1000 mL

## 2023-11-13 MED ORDER — MIDAZOLAM HCL 2 MG/2ML IJ SOLN
INTRAMUSCULAR | Status: AC
  Filled 2023-11-13: qty 2

## 2023-11-13 MED ORDER — TICAGRELOR 90 MG PO TABS
90.0000 mg | ORAL_TABLET | Freq: Two times a day (BID) | ORAL | Status: DC
  Administered 2023-11-14: 90 mg via ORAL
  Filled 2023-11-13 (×2): qty 1

## 2023-11-13 MED ORDER — VANCOMYCIN HCL 1000 MG IV SOLR
INTRAVENOUS | Status: AC
  Filled 2023-11-13: qty 20

## 2023-11-13 MED ORDER — ORAL CARE MOUTH RINSE: 15.0000 mL | Freq: Once | OROMUCOSAL | Status: AC

## 2023-11-13 MED ORDER — FENTANYL CITRATE (PF) 250 MCG/5ML IJ SOLN
INTRAMUSCULAR | Status: AC
  Filled 2023-11-13: qty 5

## 2023-11-13 MED ORDER — FENTANYL CITRATE (PF) 100 MCG/2ML IJ SOLN: 50.0000 ug | Freq: Once | INTRAMUSCULAR | Status: AC

## 2023-11-13 MED ORDER — ONDANSETRON HCL 4 MG/2ML IJ SOLN
INTRAMUSCULAR | Status: AC
  Filled 2023-11-13: qty 2

## 2023-11-13 MED ORDER — MIDAZOLAM HCL 2 MG/2ML IJ SOLN: 2.0000 mg | Freq: Once | INTRAMUSCULAR | Status: AC

## 2023-11-13 MED ORDER — INSULIN ASPART 100 UNIT/ML IJ SOLN
INTRAMUSCULAR | Status: AC
  Filled 2023-11-13: qty 1

## 2023-11-13 MED ORDER — CEFAZOLIN SODIUM-DEXTROSE 2-4 GM/100ML-% IV SOLN
2.0000 g | Freq: Two times a day (BID) | INTRAVENOUS | Status: DC
  Administered 2023-11-13: 2 g via INTRAVENOUS
  Filled 2023-11-13: qty 100

## 2023-11-13 MED ORDER — FENTANYL CITRATE (PF) 100 MCG/2ML IJ SOLN
INTRAMUSCULAR | Status: AC
  Administered 2023-11-13: 50 ug via INTRAVENOUS
  Filled 2023-11-13: qty 2

## 2023-11-13 MED ORDER — ROCURONIUM BROMIDE 10 MG/ML (PF) SYRINGE
PREFILLED_SYRINGE | INTRAVENOUS | Status: AC
  Filled 2023-11-13: qty 10

## 2023-11-13 MED ORDER — METHOCARBAMOL 500 MG PO TABS
500.0000 mg | ORAL_TABLET | Freq: Four times a day (QID) | ORAL | Status: DC | PRN
  Administered 2023-11-13: 500 mg via ORAL
  Filled 2023-11-13: qty 1

## 2023-11-13 MED ORDER — DOCUSATE SODIUM 100 MG PO CAPS
100.0000 mg | ORAL_CAPSULE | Freq: Two times a day (BID) | ORAL | Status: DC
  Administered 2023-11-13 – 2023-11-14 (×3): 100 mg via ORAL
  Filled 2023-11-13 (×3): qty 1

## 2023-11-13 MED ORDER — PROPOFOL 10 MG/ML IV BOLUS
INTRAVENOUS | Status: DC | PRN
  Administered 2023-11-13: 100 mg via INTRAVENOUS

## 2023-11-13 MED ORDER — INSULIN ASPART 100 UNIT/ML IJ SOLN
8.0000 [IU] | Freq: Once | INTRAMUSCULAR | Status: AC
  Administered 2023-11-13: 8 [IU] via SUBCUTANEOUS

## 2023-11-13 MED ORDER — ROCURONIUM BROMIDE 10 MG/ML (PF) SYRINGE
PREFILLED_SYRINGE | INTRAVENOUS | Status: DC | PRN
  Administered 2023-11-13: 10 mg via INTRAVENOUS
  Administered 2023-11-13: 50 mg via INTRAVENOUS

## 2023-11-13 MED ORDER — CHLORHEXIDINE GLUCONATE 0.12 % MT SOLN
OROMUCOSAL | Status: AC
  Administered 2023-11-13: 15 mL via OROMUCOSAL
  Filled 2023-11-13: qty 15

## 2023-11-13 MED ORDER — MORPHINE SULFATE (PF) 2 MG/ML IV SOLN: 0.5000 mg | INTRAVENOUS | Status: DC | PRN

## 2023-11-13 MED ORDER — SUGAMMADEX SODIUM 200 MG/2ML IV SOLN
INTRAVENOUS | Status: DC | PRN
  Administered 2023-11-13: 200 mg via INTRAVENOUS

## 2023-11-13 MED ORDER — PHENOL 1.4 % MT LIQD
1.0000 | OROMUCOSAL | Status: DC | PRN
  Administered 2023-11-13: 1 via OROMUCOSAL
  Filled 2023-11-13: qty 177

## 2023-11-13 MED ORDER — CHLORHEXIDINE GLUCONATE 0.12 % MT SOLN: 15.0000 mL | Freq: Once | OROMUCOSAL | Status: AC

## 2023-11-13 MED ORDER — ONDANSETRON HCL 4 MG/2ML IJ SOLN
INTRAMUSCULAR | Status: DC | PRN
  Administered 2023-11-13: 4 mg via INTRAVENOUS

## 2023-11-13 MED ORDER — ONDANSETRON HCL 4 MG PO TABS: 4.0000 mg | ORAL_TABLET | Freq: Four times a day (QID) | ORAL | Status: DC | PRN

## 2023-11-13 MED ORDER — DEXAMETHASONE SODIUM PHOSPHATE 10 MG/ML IJ SOLN
INTRAMUSCULAR | Status: DC | PRN
  Administered 2023-11-13: 10 mg via INTRAVENOUS

## 2023-11-13 MED ORDER — VANCOMYCIN HCL 1000 MG IV SOLR
INTRAVENOUS | Status: DC | PRN
  Administered 2023-11-13: 1000 mg

## 2023-11-13 MED ORDER — DEXAMETHASONE SODIUM PHOSPHATE 10 MG/ML IJ SOLN
INTRAMUSCULAR | Status: AC
  Filled 2023-11-13: qty 1

## 2023-11-13 MED ORDER — MIDAZOLAM HCL 2 MG/2ML IJ SOLN
INTRAMUSCULAR | Status: AC
  Administered 2023-11-13: 2 mg via INTRAVENOUS
  Filled 2023-11-13: qty 2

## 2023-11-13 MED ORDER — EPHEDRINE 5 MG/ML INJ
INTRAVENOUS | Status: AC
  Filled 2023-11-13: qty 5

## 2023-11-13 MED ORDER — INSULIN ASPART 100 UNIT/ML IJ SOLN
0.0000 [IU] | INTRAMUSCULAR | Status: DC | PRN
  Administered 2023-11-13: 2 [IU] via SUBCUTANEOUS

## 2023-11-13 MED ORDER — DIPHENHYDRAMINE HCL 12.5 MG/5ML PO ELIX: 12.5000 mg | ORAL_SOLUTION | ORAL | Status: DC | PRN

## 2023-11-13 MED ORDER — PROPOFOL 10 MG/ML IV BOLUS
INTRAVENOUS | Status: AC
  Filled 2023-11-13: qty 20

## 2023-11-13 MED ORDER — HYDROCODONE-ACETAMINOPHEN 5-325 MG PO TABS: 1.0000 | ORAL_TABLET | ORAL | Status: DC | PRN

## 2023-11-13 MED ORDER — HYDROCODONE-ACETAMINOPHEN 7.5-325 MG PO TABS
1.0000 | ORAL_TABLET | ORAL | Status: DC | PRN
  Administered 2023-11-13 (×2): 2 via ORAL
  Filled 2023-11-13 (×2): qty 2

## 2023-11-13 MED ORDER — HYDROMORPHONE HCL 1 MG/ML IJ SOLN: 0.2500 mg | INTRAMUSCULAR | Status: DC | PRN

## 2023-11-13 MED ORDER — EPHEDRINE SULFATE-NACL 50-0.9 MG/10ML-% IV SOSY
PREFILLED_SYRINGE | INTRAVENOUS | Status: DC | PRN
  Administered 2023-11-13: 10 mg via INTRAVENOUS

## 2023-11-13 MED ORDER — SODIUM CHLORIDE 0.9 % IV SOLN: INTRAVENOUS | Status: DC

## 2023-11-13 MED ORDER — ACETAMINOPHEN 325 MG PO TABS: 325.0000 mg | ORAL_TABLET | Freq: Four times a day (QID) | ORAL | Status: DC | PRN

## 2023-11-13 MED ORDER — METHOCARBAMOL 1000 MG/10ML IJ SOLN: 500.0000 mg | Freq: Four times a day (QID) | INTRAMUSCULAR | Status: DC | PRN

## 2023-11-13 SURGICAL SUPPLY — 54 items
BAG COUNTER SPONGE SURGICOUNT (BAG) ×1 IMPLANT
BANDAGE ESMARK 6X9 LF (GAUZE/BANDAGES/DRESSINGS) ×1 IMPLANT
BIT DRILL LONG 4.0 (BIT) IMPLANT
BIT DRILL SHORT 4.0 (BIT) IMPLANT
BNDG COHESIVE 4X5 TAN STRL LF (GAUZE/BANDAGES/DRESSINGS) ×1 IMPLANT
BNDG ELASTIC 4INX 5YD STR LF (GAUZE/BANDAGES/DRESSINGS) IMPLANT
BNDG ELASTIC 4X5.8 VLCR STR LF (GAUZE/BANDAGES/DRESSINGS) IMPLANT
BNDG ELASTIC 6INX 5YD STR LF (GAUZE/BANDAGES/DRESSINGS) IMPLANT
BRUSH SCRUB EZ PLAIN DRY (MISCELLANEOUS) ×2 IMPLANT
CHLORAPREP W/TINT 26 (MISCELLANEOUS) ×1 IMPLANT
COVER SURGICAL LIGHT HANDLE (MISCELLANEOUS) ×1 IMPLANT
DRAPE C-ARM 42X72 X-RAY (DRAPES) ×1 IMPLANT
DRAPE C-ARMOR (DRAPES) ×1 IMPLANT
DRAPE SURG ORHT 6 SPLT 77X108 (DRAPES) ×2 IMPLANT
DRAPE U-SHAPE 47X51 STRL (DRAPES) ×1 IMPLANT
DRSG ADAPTIC 3X8 NADH LF (GAUZE/BANDAGES/DRESSINGS) IMPLANT
DRSG MEPITEL 4X7.2 (GAUZE/BANDAGES/DRESSINGS) IMPLANT
ELECTRODE REM PT RTRN 9FT ADLT (ELECTROSURGICAL) ×1 IMPLANT
GAUZE SPONGE 4X4 12PLY STRL (GAUZE/BANDAGES/DRESSINGS) IMPLANT
GLOVE BIO SURGEON STRL SZ 6.5 (GLOVE) ×3 IMPLANT
GLOVE BIO SURGEON STRL SZ7.5 (GLOVE) ×3 IMPLANT
GLOVE BIOGEL PI IND STRL 6.5 (GLOVE) ×1 IMPLANT
GLOVE BIOGEL PI IND STRL 7.5 (GLOVE) ×1 IMPLANT
GOWN STRL REUS W/ TWL LRG LVL3 (GOWN DISPOSABLE) ×2 IMPLANT
KIT TURNOVER KIT B (KITS) ×1 IMPLANT
MANIFOLD NEPTUNE II (INSTRUMENTS) ×1 IMPLANT
NAIL IM CANN TRI 10X160 LT (Nail) IMPLANT
NDL HYPO 21X1.5 SAFETY (NEEDLE) IMPLANT
NDL HYPO 25GX1X1/2 BEV (NEEDLE) ×1 IMPLANT
NEEDLE HYPO 21X1.5 SAFETY (NEEDLE) IMPLANT
NEEDLE HYPO 25GX1X1/2 BEV (NEEDLE) IMPLANT
NS IRRIG 1000ML POUR BTL (IV SOLUTION) ×1 IMPLANT
PACK TOTAL JOINT (CUSTOM PROCEDURE TRAY) ×1 IMPLANT
PAD ARMBOARD POSITIONER FOAM (MISCELLANEOUS) ×2 IMPLANT
PAD CAST 4YDX4 CTTN HI CHSV (CAST SUPPLIES) IMPLANT
PAD CAST CTTN 4X4 STRL (SOFTGOODS) IMPLANT
PADDING CAST COTTON 6X4 STRL (CAST SUPPLIES) IMPLANT
PIN GUIDE 3.2X343MM (PIN) IMPLANT
ROD GUIDE 3.0 (MISCELLANEOUS) IMPLANT
SCREW TRIGEN LOW PROF 5.0X30 (Screw) IMPLANT
SCREW TRIGEN LOW PROF 5.0X65 (Screw) IMPLANT
SCREW TRIGEN LOW PROF 5.0X75 (Screw) IMPLANT
SPONGE T-LAP 18X18 ~~LOC~~+RFID (SPONGE) IMPLANT
STAPLER VISISTAT 35W (STAPLE) ×1 IMPLANT
SUCTION TUBE FRAZIER 10FR DISP (SUCTIONS) ×1 IMPLANT
SUT ETHILON 3 0 PS 1 (SUTURE) ×2 IMPLANT
SUT PROLENE 0 CT (SUTURE) IMPLANT
SUT VIC AB 0 CT1 27XBRD ANBCTR (SUTURE) ×1 IMPLANT
SUT VIC AB 2-0 CT1 TAPERPNT 27 (SUTURE) ×2 IMPLANT
SYR CONTROL 10ML LL (SYRINGE) ×1 IMPLANT
TOWEL GREEN STERILE (TOWEL DISPOSABLE) ×2 IMPLANT
TOWEL GREEN STERILE FF (TOWEL DISPOSABLE) ×1 IMPLANT
UNDERPAD 30X36 HEAVY ABSORB (UNDERPADS AND DIAPERS) ×1 IMPLANT
WATER STERILE IRR 1000ML POUR (IV SOLUTION) ×1 IMPLANT

## 2023-11-13 NOTE — Interval H&P Note (Signed)
 History and Physical Interval Note:  11/13/2023 11:11 AM  Dana [name]  has presented today for surgery, with the diagnosis of Left ankle fracture.  The various methods of treatment have been discussed with the patient and family. After consideration of risks, benefits and other options for treatment, the patient has consented to  Procedure(s): HINDFOOT FUSION FIXATION OF ANKLE (Left) as a surgical intervention.  The patient's history has been reviewed, patient examined, no change in status, stable for surgery.  I have reviewed the patient's chart and labs.  Questions were answered to the patient's satisfaction.     Shametra Cumberland P Lovada Barwick

## 2023-11-13 NOTE — Transfer of Care (Signed)
 Immediate Anesthesia Transfer of Care Note  Patient: Dana [name]  Procedure(s) Performed: HINDFOOT FUSION FIXATION OF ANKLE (Left: Ankle)  Patient Location: PACU  Anesthesia Type:General  Level of Consciousness: awake, alert , and oriented  Airway & Oxygen  Therapy: Patient Spontanous Breathing and Patient connected to face mask oxygen   Post-op Assessment: Report given to RN and Post -op Vital signs reviewed and stable  Post vital signs: Reviewed and stable  Last Vitals:  Vitals Value Taken Time  BP 139/57 11/13/23 1330  Temp    Pulse 80 11/13/23 1331  Resp 15 11/13/23 1331  SpO2 90 % 11/13/23 1331  Vitals shown include unfiled device data.  Last Pain:  Vitals:   11/13/23 1049  TempSrc: Oral  PainSc: 7          Complications: No notable events documented.

## 2023-11-13 NOTE — Anesthesia Postprocedure Evaluation (Signed)
 Anesthesia Post Note  Patient: Dana [name]  Procedure(s) Performed: HINDFOOT FUSION FIXATION OF ANKLE (Left: Ankle)     Patient location during evaluation: PACU Anesthesia Type: General and Regional Level of consciousness: awake and alert Pain management: pain level controlled Vital Signs Assessment: post-procedure vital signs reviewed and stable Respiratory status: spontaneous breathing, nonlabored ventilation, respiratory function stable and patient connected to nasal cannula oxygen  Cardiovascular status: blood pressure returned to baseline and stable Postop Assessment: no apparent nausea or vomiting Anesthetic complications: no  No notable events documented.  Last Vitals:  Vitals:   11/13/23 1345 11/13/23 1400  BP: (!) 134/59 (!) 135/59  Pulse: 80 79  Resp: 16 19  Temp:  36.4 C  SpO2: 92% 91%    Last Pain:  Vitals:   11/13/23 1330  TempSrc:   PainSc: Asleep                 Detric Scalisi,W. EDMOND

## 2023-11-13 NOTE — Anesthesia Procedure Notes (Signed)
 Procedure Name: Intubation Date/Time: 11/13/2023 12:02 PM  Performed by: Artemisa Bile D, CRNAPre-anesthesia Checklist: Patient identified, Emergency Drugs available, Suction available and Patient being monitored Patient Re-evaluated:Patient Re-evaluated prior to induction Oxygen  Delivery Method: Circle System Utilized Preoxygenation: Pre-oxygenation with 100% oxygen  Induction Type: IV induction Ventilation: Mask ventilation without difficulty Laryngoscope Size: Mac and 3 Grade View: Grade II Tube type: Oral Tube size: 7.0 mm Number of attempts: 1 Airway Equipment and Method: Stylet and Oral airway Placement Confirmation: ETT inserted through vocal cords under direct vision, positive ETCO2 and breath sounds checked- equal and bilateral Secured at: 21 cm Tube secured with: Tape Dental Injury: Teeth and Oropharynx as per pre-operative assessment

## 2023-11-13 NOTE — Anesthesia Procedure Notes (Signed)
 Anesthesia Regional Block: Adductor canal block   Pre-Anesthetic Checklist: , timeout performed,  Correct Patient, Correct Site, Correct Laterality,  Correct Procedure, Correct Position, site marked,  Risks and benefits discussed,  Pre-op  evaluation,  At surgeon's request and post-op pain management  Laterality: Left  Prep: Maximum Sterile Barrier Precautions used, chloraprep       Needles:  Injection technique: Single-shot  Needle Type: Echogenic Stimulator Needle     Needle Length: 9cm  Needle Gauge: 21     Additional Needles:   Procedures:,,,, ultrasound used (permanent image in chart),,    Narrative:  Start time: 11/13/2023 11:35 AM End time: 11/13/2023 11:40 AM Injection made incrementally with aspirations every 5 mL.  Performed by: Personally  Anesthesiologist: Jake Mayers, MD  Additional Notes:

## 2023-11-13 NOTE — Discharge Instructions (Addendum)
 [facility] Discharge Instructions   General Discharge Instructions  WEIGHT BEARING STATUS: Weightbearing left lower extremity in CAM boot for transfers only   RANGE OF MOTION/ACTIVITY: ok for knee motion as tolerated  Wound Care: You may remove your surgical dressing on post op day 3 (Saturday 11/16/23). Incisions can be left open to air if there is no drainage. Once the incision is completely dry and without drainage, it may be left open to air out.  Showering may begin post op day 4 (Sunday 11/17/23).  Clean incision gently with soap and water.  DVT/PE prophylaxis: Aspirin  and Brilinta   Diet: as you were eating previously.  Can use over the counter stool softeners and bowel preparations, such as Miralax , to help with bowel movements.  Narcotics can be constipating.  Be sure to drink plenty of fluids  PAIN MEDICATION USE AND EXPECTATIONS  You have likely been given narcotic medications to help control your pain.  After a traumatic event that results in an fracture (broken bone) with or without surgery, it is ok to use narcotic pain medications to help control one's pain.  We understand that everyone responds to pain differently and each individual patient will be evaluated on a regular basis for the continued need for narcotic medications. Ideally, narcotic medication use should last no more than 6-8 weeks (coinciding with fracture healing).   As a patient it is your responsibility as well to monitor narcotic medication use and report the amount and frequency you use these medications when you come to your office visit.   We would also advise that if you are using narcotic medications, you should take a dose prior to therapy to maximize you participation.  IF YOU ARE ON NARCOTIC MEDICATIONS IT IS NOT PERMISSIBLE TO OPERATE A MOTOR VEHICLE (MOTORCYCLE/CAR/TRUCK/MOPED) OR HEAVY MACHINERY DO NOT MIX NARCOTICS WITH OTHER CNS (CENTRAL NERVOUS SYSTEM) DEPRESSANTS SUCH AS  ALCOHOL  POST-OPERATIVE OPIOID TAPER INSTRUCTIONS: It is important to wean off of your opioid medication as soon as possible. If you do not need pain medication after your surgery it is ok to stop day one. Opioids include: Codeine, Hydrocodone (Norco, Vicodin), Oxycodone (Percocet, oxycontin ) and hydromorphone amongst others.  Long term and even short term use of opiods can cause: Increased pain response Dependence Constipation Depression Respiratory depression And more.  Withdrawal symptoms can include Flu like symptoms Nausea, vomiting And more Techniques to manage these symptoms Hydrate well Eat regular healthy meals Stay active Use relaxation techniques(deep breathing, meditating, yoga) Do Not substitute Alcohol to help with tapering If you have been on opioids for less than two weeks and do not have pain than it is ok to stop all together.  Plan to wean off of opioids This plan should start within one week post op of your fracture surgery  Maintain the same interval or time between taking each dose and first decrease the dose.  Cut the total daily intake of opioids by one tablet each day Next start to increase the time between doses. The last dose that should be eliminated is the evening dose.    STOP SMOKING OR USING NICOTINE PRODUCTS!!!!  As discussed nicotine severely impairs your body's ability to heal surgical and traumatic wounds but also impairs bone healing.  Wounds and bone heal by forming microscopic blood vessels (angiogenesis) and nicotine is a vasoconstrictor (essentially, shrinks blood vessels).  Therefore, if vasoconstriction occurs to these microscopic blood vessels they essentially disappear and are unable to deliver necessary nutrients to the healing tissue.  This is one modifiable factor that you can do to dramatically increase your chances of healing your injury.  (This means no smoking, no nicotine gum, patches, etc)  DO NOT USE NONSTEROIDAL  ANTI-INFLAMMATORY DRUGS (NSAID'S)  Using products such as Advil (ibuprofen), Aleve (naproxen), Motrin (ibuprofen) for additional pain control during fracture healing can delay and/or prevent the healing response.  If you would like to take over the counter (OTC) medication, Tylenol  (acetaminophen ) is ok.  However, some narcotic medications that are given for pain control contain acetaminophen  as well. Therefore, you should not exceed more than 4000 mg of tylenol  in a day if you do not have liver disease.  Also note that there are may OTC medicines, such as cold medicines and allergy  medicines that my contain tylenol  as well.  If you have any questions about medications and/or interactions please ask your doctor/PA or your pharmacist.      ICE AND ELEVATE INJURED/OPERATIVE EXTREMITY  Using ice and elevating the injured extremity above your heart can help with swelling and pain control.  Icing in a pulsatile fashion, such as 20 minutes on and 20 minutes off, can be followed.    Do not place ice directly on skin. Make sure there is a barrier between to skin and the ice pack.    Using frozen items such as frozen peas works well as the conform nicely to the are that needs to be iced.  USE AN ACE WRAP OR TED HOSE FOR SWELLING CONTROL  In addition to icing and elevation, Ace wraps or TED hose are used to help limit and resolve swelling.  It is recommended to use Ace wraps or TED hose until you are informed to stop.    When using Ace Wraps start the wrapping distally (farthest away from the body) and wrap proximally (closer to the body)   Example: If you had surgery on your leg or thing and you do not have a splint on, start the ace wrap at the toes and work your way up to the thigh        If you had surgery on your upper extremity and do not have a splint on, start the ace wrap at your fingers and work your way up to the upper arm  IF YOU ARE IN A CAM BOOT (BLACK BOOT)  You may remove boot periodically.  Perform daily dressing changes as noted below.  Wash the liner of the boot regularly and wear a sock when wearing the boot. It is recommended that you sleep in the boot until told otherwise   CALL THE OFFICE FOR MEDICATION REFILLS OR WITH ANY QUESTIONS/CONCERNS: [phone]   VISIT OUR WEBSITE FOR ADDITIONAL INFORMATION: [email or web address]  Discharge Wound Care Instructions  Do NOT apply any ointments, solutions or lotions to pin sites or surgical wounds.  These prevent needed drainage and even though solutions like hydrogen peroxide kill bacteria, they also damage cells lining the pin sites that help fight infection.  Applying lotions or ointments can keep the wounds moist and can cause them to breakdown and open up as well. This can increase the risk for infection. When in doubt call the office.  Surgical incisions should be dressed daily.  If any drainage is noted, use one layer of adaptic or Mepitel, then gauze, Kerlix, and an ace wrap. - These dressing supplies should be available at local medical supply stores ([facility], [facility], etc) as well as Insurance claims handler ([facility], [facility], [facility], etc)  Once the  incision is completely dry and without drainage, it may be left open to air out.  Showering may begin 36-48 hours later.  Cleaning gently with soap and water.  Call office for the following: Temperature greater than 101F Persistent nausea and vomiting Severe uncontrolled pain Redness, tenderness, or signs of infection (pain, swelling, redness, odor or green/yellow discharge around the site) Difficulty breathing, headache or visual disturbances Hives Persistent dizziness or light-headedness Extreme fatigue Any other questions or concerns you may have after discharge  In an emergency, call 911 or go to an Emergency Department at a nearby hospital  OTHER HELPFUL INFORMATION  If you had a block, it will wear off between 8-24 hrs postop typically.  This is period when  your pain may go from nearly zero to the pain you would have had postop without the block.  This is an abrupt transition but nothing dangerous is happening.  You may take an extra dose of narcotic when this happens.  You should wean off your narcotic medicines as soon as you are able.  Most patients will be off or using minimal narcotics before their first postop appointment.   We suggest you use the pain medication the first night prior to going to bed, in order to ease any pain when the anesthesia wears off. You should avoid taking pain medications on an empty stomach as it will make you nauseous.  Do not drink alcoholic beverages or take illicit drugs when taking pain medications.  In most states it is against the law to drive while you are in a splint or sling.  And certainly against the law to drive while taking narcotics.  You may return to work/school in the next couple of days when you feel up to it.   Pain medication may make you constipated.  Below are a few solutions to try in this order: Decrease the amount of pain medication if you aren't having pain. Drink lots of decaffeinated fluids. Drink prune juice and/or each dried prunes  If the first 3 don't work start with additional solutions Take Colace - an over-the-counter stool softener Take Senokot - an over-the-counter laxative Take Miralax  - a stronger over-the-counter laxative

## 2023-11-13 NOTE — Plan of Care (Signed)

## 2023-11-13 NOTE — Op Note (Signed)
 Orthopaedic Surgery Operative Note (CSN: [id number] ) Date of Surgery: 11/13/2023  Admit Date: 11/11/2023   Diagnoses: Pre-Op  Diagnoses: Left trimalleolar ankle fracture/dislocation Left diabetic neuropathy  Post-Op Diagnosis: Left trimalleolar ankle fracture/dislocation Left diabetic neuropathy Left achilles tendon contracture  Procedures: CPT 27870-Left tibiotalar fusion CPT 28725-Left subtalar fusion CPT 27606-Percutaneous Achilles lengthening of left ankle  Surgeons : Primary: Laneta Pintos, MD  Assistant: Alona Jamaica, PA-C  Location: OR 3   Anesthesia: General with regional   Antibiotics: Ancef  2g preop with 1 gm vancomycin powder placed topically   Tourniquet time: None    Estimated Blood Loss: 100 mL  Complications:* No complications entered in OR log *   Specimens:* No specimens in log *   Implants: Implant Name Type Inv. Item Serial No. Manufacturer Lot No. LRB No. Used Action  NAIL IM CANN TRI [id number] LT - [id number] Nail NAIL IM CANN TRI 10X160 LT  [employer] [id number] Left 1 Implanted  SCREW TRIGEN LOW PROF 5.0X75 - [id number] Screw SCREW TRIGEN LOW PROF 5.0X75  [employer] [id number] Left 1 Implanted  SCREW TRIGEN LOW PROF 5.0X65 - [id number] Screw SCREW TRIGEN LOW PROF 5.0X65  [employer] [id number] Left 1 Implanted  SCREW TRIGEN LOW PROF 5.0X30 - [id number] Screw SCREW TRIGEN LOW PROF 5.0X30  [employer] [id number] Left 1 Implanted     Indications for Surgery: 63 year old female who sustained a left trimalleolar ankle fracture dislocation.  Unfortunately she started to ambulate on it and she displaced prior to returning to the office.  She was then admitted and it was felt due to her peripheral neuropathy and her unstable fracture that a hindfoot fusion nail would be most appropriate to prevent risk of complications and hardware failure and infection.  Risks and benefits were discussed with  the patient and her husband.  Risks included but not limited to bleeding, infection, malunion, nonunion, hardware failure, hardware irritation, nerve and blood vessel injury, posttraumatic arthritis, need for hardware removal, even the possibility anesthetic complications.  She agreed to proceed with surgery and consent was obtained.  Operative Findings: 1.  Hindfoot fusion nail for tibiotalar and subtalar joint fusions using [employer] 10 x 160 mm hindfoot fusion down 2.  Significant Achilles contracture to the left lower extremity treated with a percutaneous Achilles lengthening.  Procedure: The patient was identified in the preoperative holding area. Consent was confirmed with the patient and their family and all questions were answered. The operative extremity was marked after confirmation with the patient. she was then brought back to the operating room by our anesthesia colleagues.  She was placed under general anesthetic and carefully transferred over to radiolucent flattop table.  A bump was placed under her operative hip.  The left lower extremity was then prepped and draped in usual sterile fashion.  A timeout was performed to verify the patient, the procedure, and the extremity.  Preoperative antibiotics were dosed.  Fluoroscopic imaging showed the unstable nature of her injury.  The fracture was reduced and I tried to dorsiflex the ankle with it reduced to get it to neutral unfortunately this was not successful.  I felt that to get her to a decent position of her foot for her hindfoot fusion nail and would require percutaneous lengthening of the Achilles.  I then used a 15 blade entered the center of the Achilles just above the calcaneus and released it laterally.  I then went 1 cm above this and released  it medially and then 1 cm above this and released it laterally.  I was able to manipulate the ankle in more dorsiflexion to get her to almost plantigrade.  Once I had adequate position of  the tibiotalar joint and the ankle I then percutaneously placed a threaded guidewire at the appropriate starting point for a hindfoot fusion nail.  I advanced it into the calcaneus crossing the subtalar joint and then crossing the tibiotalar joint.  I used fluoroscopic imaging as a guide to make sure that my path was appropriate.  I then cut down on this and used an entry reamer to cross the subtalar and tibiotalar joints.  I then passed a ball-tipped guidewire down the center of the canal.  I then reamed up to 11 mm and chose to use a 10 x 160 mm nail.  The nail was passed and then I used the targeting guide to place a calcaneal to talus screw and then a calcaneal to cuboid screw.  Excellent fixation was obtained.  I then used perfect circle technique to place a medial to lateral proximal interlocking screw.  The targeting arm was removed and final fluoroscopic imaging was obtained.  The incisions were irrigated a gram of vancomycin powder was placed.  And closed with 3-0 nylon.  Sterile dressings were applied.  The patient was then awoke from anesthesia and taken to the PACU in stable condition.  Post Op Plan/Instructions: The patient will be weightbearing for transfers to the left lower extremity.  She will receive postoperative Ancef .  She was placed on her antiplatelet therapy for DVT prophylaxis this can be started postoperative day 1.  I was present and performed the entire surgery.  Alona Jamaica, PA-C did assist me throughout the case. An assistant was necessary given the difficulty in approach, maintenance of reduction and ability to instrument the fracture.   Katheryne Pane, MD [facility]

## 2023-11-13 NOTE — Progress Notes (Signed)
 PHARMACY NOTE:  ANTIMICROBIAL RENAL DOSAGE ADJUSTMENT  Current antimicrobial regimen includes a mismatch between antimicrobial dosage and estimated renal function.  As per policy approved by the Pharmacy & Therapeutics and Medical Executive Committees, the antimicrobial dosage will be adjusted accordingly.  Current antimicrobial dosage:  Ancef  2gm IV Q8H x 3 doses  Indication: surgical prophylaxis  Renal Function:  Estimated Creatinine Clearance: 26.7 mL/min (A) (by C-G formula based on SCr of 2.58 mg/dL (H)). []      On intermittent HD, scheduled: []      On CRRT    Antimicrobial dosage has been changed to:  Ancef  2gm IV Q12H x 2 doses   Abbigale Mcelhaney D. Marikay Show, PharmD, BCPS, BCCCP 11/13/2023, 2:19 PM

## 2023-11-13 NOTE — Anesthesia Procedure Notes (Addendum)
 Anesthesia Regional Block: Popliteal block   Pre-Anesthetic Checklist: , timeout performed,  Correct Patient, Correct Site, Correct Laterality,  Correct Procedure, Correct Position, site marked,  Risks and benefits discussed,  Pre-op  evaluation,  At surgeon's request and post-op pain management  Laterality: Left  Prep: Maximum Sterile Barrier Precautions used, chloraprep       Needles:  Injection technique: Single-shot  Needle Type: Echogenic Stimulator Needle     Needle Length: 9cm  Needle Gauge: 21     Additional Needles:   Procedures:,,,, ultrasound used (permanent image in chart),,    Narrative:  Start time: 11/13/2023 11:25 AM End time: 11/13/2023 11:35 AM Injection made incrementally with aspirations every 5 mL.  Performed by: Personally  Anesthesiologist: Jake Mayers, MD

## 2023-11-13 NOTE — Progress Notes (Signed)
 PROGRESS NOTE    Dana [name]  ZOX:[id number] DOB: [birth year] DOA: 11/11/2023 PCP: Mercy Stall, MD   Brief Narrative:  Dana [name] is a 63 y.o. female with medical history significant of hypertension, hyperlipidemia, atrial fibrillation status post ablation, CAD, GERD, CKD 4, diabetes, neuropathy, chronic combined systolic and diastolic CHF, anxiety, depression, obesity, OSA, chronic respiratory failure with hypoxia, anemia, cardiac arrest, chronic pain, toe amputation, gout was directly admitted from Ortho clinic for surgical fixation of the left trimalleolar ankle fracture and dislocation.   Patient was initially seen in the ED on 4/22 due to a fall when getting out of her recliner with a twisting motion.  She was diagnosed with trimalleolar fracture which was reduced and splinted and then patient was discharged with orthopedic follow-up.   Since that time patient has had trouble with maintaining nonweightbearing.  After reevaluating in orthopedic office 11/11/2023, decision was made to directly admit patient for surgical correction.    Assessment & Plan:   Principal Problem:   Trimalleolar fracture of ankle, closed Active Problems:   Anemia in CKD (chronic kidney disease)   Chronic pain syndrome   Diabetic polyneuropathy associated with type 2 diabetes mellitus (HCC)   GERD without esophagitis   Hyperlipidemia LDL goal <70   S/P ablation of atrial fibrillation   Class 2 severe obesity due to excess calories with serious comorbidity and body mass index (BMI) of 39.0 to 39.9 in adult [facility])   Hypertensive heart and renal disease with congestive heart failure (HCC)   Lumbar back pain   Chronic respiratory failure with hypoxia (HCC)   Chronic gout without tophus   OSA on CPAP   Insulin  dependent type 2 diabetes mellitus (HCC)   Vitamin D  insufficiency   S/P mitral valve clip implantation   GAD (generalized anxiety disorder)   Mixed hyperlipidemia   Unstable angina (HCC)   CKD  stage 4 due to type 1 diabetes mellitus (HCC)   History of amputation of hallux (HCC)   Moderate recurrent major depression (HCC)   Coronary artery disease involving native coronary artery of native heart with angina pectoris (HCC)  Trimalleolar ankle fracture, left, POA: > Patient presenting from orthopedic office for surgical correction of left trimalleolar ankle fracture.  Please see details above.  Orthopedics on board, plan for surgical repair today.  Appreciate their help.   CAD She has had extensive CAD with prior PCI's and multiple evidence of in-stent restenosis in the proximal LAD with last intervention January 2025.   - scoring balloon angioplasty mid LAD 07/2023 - she has been on DAPT with ASA and brilinta  which is on hold now for surgery and cardiology recommends resuming postsurgery. - continue coreg ,    Chronic combined systolic and diastolic CHF / Status post MitraClip: > Last echo was in February of this year with EF 40 to 45%, indeterminate diastolic function, poorly visualized RV function.  Status post MitraClip.  Some wall motion abnormality was noted. > Certainly there is a ischemic component to this given the above. > Follows with advanced heart failure clinic - Continue home torsemide , carvedilol , Imdur    Hypertension - Blood pressure very well-controlled, continue home torsemide , carvedilol , Imdur    Hyperlipidemia - Continue Zetia  - On Repatha  outpatient   Atrial fibrillation > Status post ablation - Not currently on anticoagulation - Is taking Coreg  for comorbid conditions   GERD - Continue PPI   CKD 4 - Baseline creatinine between 2-2.6.  Currently at baseline.  Trend renal function and  electrolytes   Diabetes melitis type II,: > Last hemoglobin A1c 10.2 on 11/12/2023.  On 33-35 units long-acting outpatient and low-dose with meals.  Currently on 30 units Semglee  and SSI.  Slightly hyperglycemic but we will allow that since she is n.p.o. now so we will  not make any changes today.   Neuropathy - Continue home gabapentin    Anxiety Depression - Continue home doxepin  and as needed Ativan    Obesity class III, POA: - BMI 36.  Weight loss and diet modification counseled.   OSA - Continue CPAP   Chronic respiratory failure with hypoxia - On chronic 2 L   Anemia of chronic disease - Baseline hemoglobin appears to be anywhere between 8.5-10.5.  Currently hemoglobin is 7.9.  Trend CBC   Gout - Continue home allopurinol    History of cardiac arrest in 2022 History of toe amputation - Noted   Mild hyponatremia: Stable.  Monitor.  DVT prophylaxis: heparin  injection 5,000 Units Start: 11/11/23 2200   Code Status: Full Code  Family Communication:  None present at bedside.  Plan of care discussed with patient in length and he/she verbalized understanding and agreed with it.  Status is: Inpatient Remains inpatient appropriate because: Patient is scheduled for orthopedic surgery today.   Estimated body mass index is 35.62 kg/m as calculated from the following:   Height as of this encounter: 5\' 5"  (1.651 m).   Weight as of this encounter: 97.1 kg.    Nutritional Assessment: Body mass index is 35.62 kg/m.Aaron Aas Seen by dietician.  I agree with the assessment and plan as outlined below: Nutrition Status:        . Skin Assessment: I have examined the patient's skin and I agree with the wound assessment as performed by the wound care RN as outlined below:    Consultants:  Orthopedic and cardiology  Procedures:  As above  Antimicrobials:  Anti-infectives (From admission, onward)    Start     Dose/Rate Route Frequency Ordered Stop   11/13/23 1200  ceFAZolin  (ANCEF ) IVPB 2g/100 mL premix        2 g 200 mL/hr over 30 Minutes Intravenous On call to O.R. 11/12/23 1803 11/14/23 0559         Subjective: Seen and examined.  No new complaint other than left lower extremity pain which is reasonably controlled.  She is fully  aware of the plans of surgery today.  Objective: Vitals:   11/12/23 1539 11/12/23 1944 11/13/23 0506 11/13/23 0721  BP: (!) 112/49 (!) 142/52 (!) 123/47 (!) 123/50  Pulse: 68 70 75 77  Resp: 17 16 16 18   Temp: (!) 97.4 F (36.3 C) 98.3 F (36.8 C) 98.8 F (37.1 C) 98.9 F (37.2 C)  TempSrc:    Oral  SpO2: 95% 97% 99% 96%  Weight:      Height:        Intake/Output Summary (Last 24 hours) at 11/13/2023 0748 Last data filed at 11/12/2023 1300 Gross per 24 hour  Intake 480 ml  Output --  Net 480 ml   Filed Weights   11/12/23 0416  Weight: 97.1 kg    Examination:  General exam: Appears calm and comfortable, obese Respiratory system: Clear to auscultation. Respiratory effort normal. Cardiovascular system: S1 & S2 heard, RRR. No JVD, murmurs, rubs, gallops or clicks. No pedal edema. Gastrointestinal system: Abdomen is nondistended, soft and nontender. No organomegaly or masses felt. Normal bowel sounds heard. Central nervous system: Alert and oriented. No focal neurological deficits. Extremities:  Left lower extremity wrapped in the Ace wrap. Psychiatry: Judgement and insight appear normal. Mood & affect appropriate.    Data Reviewed: I have personally reviewed following labs and imaging studies  CBC: Recent Labs  Lab 11/11/23 1512 11/12/23 0619 11/13/23 0601  WBC 15.3* 16.6* 13.8*  NEUTROABS  --   --  10.8*  HGB 8.3* 8.8* 7.9*  HCT 26.1* 27.4* 24.9*  MCV 84.7 83.8 84.1  PLT 340 373 321   Basic Metabolic Panel: Recent Labs  Lab 11/11/23 1512 11/12/23 0619 11/13/23 0601  NA 132* 134* 133*  K 4.5 5.1 4.4  CL 97* 99 99  CO2 24 23 23   GLUCOSE 301* 174* 175*  BUN 88* 84* 86*  CREATININE 2.57* 2.43* 2.58*  CALCIUM  8.7* 8.9 8.4*  MG 1.8  --   --    GFR: Estimated Creatinine Clearance: 26.7 mL/min (A) (by C-G formula based on SCr of 2.58 mg/dL (H)). Liver Function Tests: Recent Labs  Lab 11/11/23 1512 11/12/23 0619  AST 9* 9*  ALT 8 8  ALKPHOS 98 100   BILITOT 0.8 0.8  PROT 7.3 7.8  ALBUMIN  2.3* 2.4*   No results for input(s): "LIPASE", "AMYLASE" in the last 168 hours. No results for input(s): "AMMONIA" in the last 168 hours. Coagulation Profile: No results for input(s): "INR", "PROTIME" in the last 168 hours. Cardiac Enzymes: No results for input(s): "CKTOTAL", "CKMB", "CKMBINDEX", "TROPONINI" in the last 168 hours. BNP (last 3 results) No results for input(s): "PROBNP" in the last 8760 hours. HbA1C: Recent Labs    11/12/23 0619  HGBA1C 10.2*   CBG: Recent Labs  Lab 11/12/23 0602 11/12/23 1116 11/12/23 1625 11/12/23 2115 11/13/23 0627  GLUCAP 166* 209* 248* 290* 165*   Lipid Profile: No results for input(s): "CHOL", "HDL", "LDLCALC", "TRIG", "CHOLHDL", "LDLDIRECT" in the last 72 hours. Thyroid  Function Tests: No results for input(s): "TSH", "T4TOTAL", "FREET4", "T3FREE", "THYROIDAB" in the last 72 hours. Anemia Panel: No results for input(s): "VITAMINB12", "FOLATE", "FERRITIN", "TIBC", "IRON ", "RETICCTPCT" in the last 72 hours. Sepsis Labs: No results for input(s): "PROCALCITON", "LATICACIDVEN" in the last 168 hours.  Recent Results (from the past 240 hours)  Surgical pcr screen     Status: None   Collection Time: 11/12/23  3:36 PM   Specimen: Nasal Mucosa; Nasal Swab  Result Value Ref Range Status   MRSA, PCR NEGATIVE NEGATIVE Final   Staphylococcus aureus NEGATIVE NEGATIVE Final    Comment: (NOTE) The Xpert SA Assay (FDA approved for NASAL specimens in patients 75 years of age and older), is one component of a comprehensive surveillance program. It is not intended to diagnose infection nor to guide or monitor treatment. Performed at [facility], [address] [place], [place] [address]      Radiology Studies: No results found.  Scheduled Meds:  allopurinol   50 mg Oral Daily   vitamin C  1,000 mg Oral Daily   aspirin  EC  81 mg Oral Daily   carvedilol   9.375 mg Oral BID   cholecalciferol   2,000 Units Oral BID   doxepin   150 mg Oral QHS   ezetimibe   10 mg Oral QPM   gabapentin   300 mg Oral TID   heparin   5,000 Units Subcutaneous Q8H   insulin  aspart  0-15 Units Subcutaneous TID WC   insulin  glargine-yfgn  30 Units Subcutaneous Daily   isosorbide  mononitrate  30 mg Oral Daily   latanoprost   1 drop Both Eyes QHS   pantoprazole   40 mg Oral Daily   sodium chloride  flush  3 mL Intravenous Q12H   torsemide   60 mg Oral Daily   Continuous Infusions:   ceFAZolin  (ANCEF ) IV       LOS: 2 days   Modena Andes, MD [facility]  11/13/2023, 7:48 AM   *Please note that this is a verbal dictation therefore any spelling or grammatical errors are due to the "Dragon [employer] One" system interpretation.  Please page via Amion and do not message via secure chat for urgent patient care matters. Secure chat can be used for non urgent patient care matters.  How to contact the [facility] Attending or Consulting provider 7A - 7P or covering provider during after hours 7P -7A, for this patient?  Check the care team in [facility] and look for a) attending/consulting [facility] provider listed and b) the [facility] team listed. Page or secure chat 7A-7P. Log into [email or web address] and use [facility]'s universal password to access. If you do not have the password, please contact the hospital operator. Locate the [facility] provider you are looking for under [facility] and page to a number that you can be directly reached. If you still have difficulty reaching the provider, please page the [facility] (Director on Call) for the Hospitalists listed on amion for assistance.

## 2023-11-13 NOTE — Progress Notes (Signed)
 Orthopedic Tech Progress Note Patient Details:  Dana [name] 19-Oct-1960 [id number]  Ortho Devices Type of Ortho Device: CAM walker Ortho Device/Splint Location: LLE Ortho Device/Splint Interventions: Ordered, Application, Adjustment   Post Interventions Patient Tolerated: Well Instructions Provided: Care of device  [name] 11/13/2023, 2:04 PM

## 2023-11-13 NOTE — Anesthesia Preprocedure Evaluation (Addendum)
 Anesthesia Evaluation  Patient identified by MRN, date of birth, ID band Patient awake    Reviewed: Allergy  & Precautions, H&P , NPO status , Patient's Chart, lab work & pertinent test results, reviewed documented beta blocker date and time   Airway Mallampati: II  TM Distance: >3 FB Neck ROM: Full    Dental no notable dental hx. (+) Teeth Intact, Dental Advisory Given   Pulmonary sleep apnea , COPD, former smoker   Pulmonary exam normal breath sounds clear to auscultation       Cardiovascular hypertension, Pt. on medications and Pt. on home beta blockers + CAD, + Past MI, + Cardiac Stents and +CHF   Rhythm:Regular Rate:Normal     Neuro/Psych   Anxiety Depression    CVA    GI/Hepatic Neg liver ROS,GERD  Medicated,,  Endo/Other  diabetes, Insulin  DependentHypothyroidism  Class 3 obesity  Renal/GU Renal disease  negative genitourinary   Musculoskeletal  (+) Arthritis , Osteoarthritis,    Abdominal   Peds  Hematology  (+) Blood dyscrasia, anemia   Anesthesia Other Findings   Reproductive/Obstetrics negative OB ROS                             Anesthesia Physical Anesthesia Plan  ASA: 3  Anesthesia Plan: General   Post-op Pain Management: Regional block* and Ofirmev  IV (intra-op)*   Induction: Intravenous  PONV Risk Score and Plan: 4 or greater and Ondansetron , Dexamethasone  and Treatment may vary due to age or medical condition  Airway Management Planned: Oral ETT  Additional Equipment:   Intra-op Plan:   Post-operative Plan: Extubation in OR  Informed Consent: I have reviewed the patients History and Physical, chart, labs and discussed the procedure including the risks, benefits and alternatives for the proposed anesthesia with the patient or authorized representative who has indicated his/her understanding and acceptance.     Dental advisory given  Plan Discussed with:  CRNA  Anesthesia Plan Comments:        Anesthesia Quick Evaluation

## 2023-11-14 ENCOUNTER — Other Ambulatory Visit (HOSPITAL_COMMUNITY): Payer: Self-pay

## 2023-11-14 ENCOUNTER — Ambulatory Visit

## 2023-11-14 ENCOUNTER — Other Ambulatory Visit: Payer: Self-pay

## 2023-11-14 ENCOUNTER — Other Ambulatory Visit: Payer: Self-pay | Admitting: Family Medicine

## 2023-11-14 ENCOUNTER — Other Ambulatory Visit: Payer: Self-pay | Admitting: Physician Assistant

## 2023-11-14 ENCOUNTER — Other Ambulatory Visit (HOSPITAL_BASED_OUTPATIENT_CLINIC_OR_DEPARTMENT_OTHER): Payer: Self-pay

## 2023-11-14 DIAGNOSIS — S82852A Displaced trimalleolar fracture of left lower leg, initial encounter for closed fracture: Secondary | ICD-10-CM | POA: Diagnosis not present

## 2023-11-14 LAB — GLUCOSE, CAPILLARY
Glucose-Capillary: 481 mg/dL — ABNORMAL HIGH (ref 70–99)
Glucose-Capillary: 409 mg/dL — ABNORMAL HIGH (ref 70–99)
Glucose-Capillary: 328 mg/dL — ABNORMAL HIGH (ref 70–99)
Glucose-Capillary: 302 mg/dL — ABNORMAL HIGH (ref 70–99)

## 2023-11-14 LAB — CBC WITH DIFFERENTIAL/PLATELET
Abs Immature Granulocytes: 0.08 10*3/uL — ABNORMAL HIGH (ref 0.00–0.07)
Basophils Absolute: 0 10*3/uL (ref 0.0–0.1)
Basophils Relative: 0 %
Eosinophils Absolute: 0 10*3/uL (ref 0.0–0.5)
Eosinophils Relative: 0 %
HCT: 24.8 % — ABNORMAL LOW (ref 36.0–46.0)
Hemoglobin: 7.8 g/dL — ABNORMAL LOW (ref 12.0–15.0)
Immature Granulocytes: 1 %
Lymphocytes Relative: 5 %
Lymphs Abs: 0.8 10*3/uL (ref 0.7–4.0)
MCH: 26.9 pg (ref 26.0–34.0)
MCHC: 31.5 g/dL (ref 30.0–36.0)
MCV: 85.5 fL (ref 80.0–100.0)
Monocytes Absolute: 0.8 10*3/uL (ref 0.1–1.0)
Monocytes Relative: 6 %
Neutro Abs: 12.8 10*3/uL — ABNORMAL HIGH (ref 1.7–7.7)
Neutrophils Relative %: 88 %
Platelets: 322 10*3/uL (ref 150–400)
RBC: 2.9 MIL/uL — ABNORMAL LOW (ref 3.87–5.11)
RDW: 14.2 % (ref 11.5–15.5)
WBC: 14.4 10*3/uL — ABNORMAL HIGH (ref 4.0–10.5)
nRBC: 0 % (ref 0.0–0.2)

## 2023-11-14 LAB — BASIC METABOLIC PANEL WITH GFR
Anion gap: 10 (ref 5–15)
BUN: 87 mg/dL — ABNORMAL HIGH (ref 6–20)
CO2: 24 mmol/L (ref 22–32)
Calcium: 8.8 mg/dL — ABNORMAL LOW (ref 8.9–10.3)
Chloride: 98 mmol/L (ref 98–111)
Creatinine, Ser: 2.67 mg/dL — ABNORMAL HIGH (ref 0.44–1.00)
GFR, Estimated: 20 mL/min — ABNORMAL LOW (ref >60)
Glucose, Bld: 433 mg/dL — ABNORMAL HIGH (ref 70–99)
Potassium: 5.2 mmol/L — ABNORMAL HIGH (ref 3.5–5.1)
Sodium: 132 mmol/L — ABNORMAL LOW (ref 135–145)

## 2023-11-14 LAB — BETA-HYDROXYBUTYRIC ACID: Beta-Hydroxybutyric Acid: 0.05 mmol/L (ref 0.05–0.27)

## 2023-11-14 LAB — GLUCOSE, RANDOM: Glucose, Bld: 513 mg/dL (ref 70–99)

## 2023-11-14 MED ORDER — INSULIN ASPART 100 UNIT/ML IJ SOLN: 0.0000 [IU] | Freq: Every day | INTRAMUSCULAR | Status: DC

## 2023-11-14 MED ORDER — VITAMIN D3 25 MCG PO TABS
2000.0000 [IU] | ORAL_TABLET | Freq: Two times a day (BID) | ORAL | 2 refills | Status: DC
  Filled 2023-11-14: qty 120, 30d supply, fill #0

## 2023-11-14 MED ORDER — INSULIN GLARGINE-YFGN 100 UNIT/ML ~~LOC~~ SOLN
45.0000 [IU] | Freq: Every day | SUBCUTANEOUS | Status: DC
Start: 2023-11-14 — End: 2023-11-14
  Administered 2023-11-14: 45 [IU] via SUBCUTANEOUS
  Filled 2023-11-14: qty 0.45

## 2023-11-14 MED ORDER — LANTUS SOLOSTAR 100 UNIT/ML ~~LOC~~ SOPN: 45.0000 [IU] | PEN_INJECTOR | Freq: Every day | SUBCUTANEOUS | Status: DC

## 2023-11-14 MED ORDER — INSULIN ASPART 100 UNIT/ML IJ SOLN
3.0000 [IU] | Freq: Three times a day (TID) | INTRAMUSCULAR | Status: DC
  Administered 2023-11-14: 3 [IU] via SUBCUTANEOUS

## 2023-11-14 MED ORDER — METHOCARBAMOL 500 MG PO TABS
500.0000 mg | ORAL_TABLET | Freq: Four times a day (QID) | ORAL | 0 refills | Status: AC | PRN
Start: 2023-11-14 — End: ?
  Filled 2023-11-14: qty 28, 7d supply, fill #0

## 2023-11-14 MED ORDER — INSULIN ASPART 100 UNIT/ML IJ SOLN
0.0000 [IU] | Freq: Three times a day (TID) | INTRAMUSCULAR | Status: DC
  Administered 2023-11-14: 15 [IU] via SUBCUTANEOUS

## 2023-11-14 MED ORDER — INSULIN ASPART 100 UNIT/ML IJ SOLN
16.0000 [IU] | Freq: Once | INTRAMUSCULAR | Status: AC
  Administered 2023-11-14: 16 [IU] via SUBCUTANEOUS

## 2023-11-14 NOTE — Discharge Planning (Signed)
 Patient alert. IV access removed. Discharge teaching given to patient Dana [name]. Patient verbalized understanding of teaching including medications. Discharge summary placed in discharge packet and placed with patient belongings. Patient will be transported home with her husband.

## 2023-11-14 NOTE — Progress Notes (Signed)
 Occupational Therapy Treatment Patient Details Name: Dana [name] MRN: [id number] DOB: 01/18/1961 Today's Date: 11/14/2023   History of present illness Pt is a 63 y/o female presenting on 4/28 after ground level fall on 4/22. Found with L trimalleolar ankle fx in ED on 4/22 and presented to orthopedic office on 4/28 with dislocation of L ankle as unable to comply with NWB restrictions.L tibiotaalar & subtalar fusion, lengthening of achilles performed 4/30.   PMH of diastolic heart failure, anoxic brain injury, COPD, RA, CVA, CAD, DES RCA/LAD 2018, DMI, HTN, PAD, L toe amputation, and OSA.   OT comments  Pt progressing well towards goals. Able to complete LB dressing with min assist. Cueing for compensatory strategies to assist with LB ADLs at home. Good standing balance for clothing management. Continue to recommend HHOT to optimize independence levels. Will continue to follow acutely.       If plan is discharge home, recommend the following:  A little help with walking and/or transfers;Assistance with cooking/housework;Assist for transportation;Help with stairs or ramp for entrance;A little help with bathing/dressing/bathroom   Equipment Recommendations  BSC/3in1 (drop arm)    Recommendations for Other Services      Precautions / Restrictions Precautions Precautions: Fall Recall of Precautions/Restrictions: Intact Precaution/Restrictions Comments: CAM boot Restrictions Weight Bearing Restrictions Per Provider Order: Yes LLE Weight Bearing Per Provider Order: Weight bearing as tolerated Other Position/Activity Restrictions: transfers only       Mobility Bed Mobility Overal bed mobility: Needs Assistance             General bed mobility comments: Received in recliner    Transfers Overall transfer level: Needs assistance Equipment used: Rolling walker (2 wheels), None Transfers: Sit to/from Stand, Bed to chair/wheelchair/BSC Sit to Stand: Contact guard assist Stand  pivot transfers: Contact guard assist         General transfer comment: with increased time     Balance                                           ADL either performed or assessed with clinical judgement   ADL Overall ADL's : Needs assistance/impaired                 Upper Body Dressing : Set up;Sitting   Lower Body Dressing: Minimal assistance;Cueing for sequencing;Sit to/from stand Lower Body Dressing Details (indicate cue type and reason): assist to thread legs and don cam boot Toilet Transfer: Contact guard assist;Rolling walker (2 wheels) Toilet Transfer Details (indicate cue type and reason): Simulated in room         Functional mobility during ADLs: Contact guard assist;Rolling walker (2 wheels) General ADL Comments: Husband able to assist with LB ADLs    Extremity/Trunk Assessment Upper Extremity Assessment Upper Extremity Assessment: Overall WFL for tasks assessed   Lower Extremity Assessment Lower Extremity Assessment: Defer to PT evaluation        Vision   Vision Assessment?: No apparent visual deficits         Communication Communication Communication: No apparent difficulties   Cognition Arousal: Alert Behavior During Therapy: WFL for tasks assessed/performed Cognition: No apparent impairments       Following commands: Intact        Cueing   Cueing Techniques: Verbal cues, Gestural cues        General Comments Vss on ra    Pertinent Vitals/  Pain       Pain Assessment Pain Assessment: Faces Faces Pain Scale: Hurts little more Pain Location: L foot Pain Descriptors / Indicators: Sore, Throbbing Pain Intervention(s): Monitored during session   Frequency  Min 2X/week        Progress Toward Goals  OT Goals(current goals can now be found in the care plan section)  Progress towards OT goals: Progressing toward goals  Acute Rehab OT Goals Patient Stated Goal: To go home OT Goal Formulation: With  patient Time For Goal Achievement: 11/26/23 Potential to Achieve Goals: Good ADL Goals Pt Will Perform Grooming: with modified independence;sitting Pt Will Perform Lower Body Dressing: with modified independence;sitting/lateral leans;with adaptive equipment Pt Will Transfer to Toilet: with modified independence;bedside commode Pt Will Perform Toileting - Clothing Manipulation and hygiene: with modified independence;sitting/lateral leans  Plan         AM-PAC OT "6 Clicks" Daily Activity     Outcome Measure   Help from another person eating meals?: None Help from another person taking care of personal grooming?: A Little Help from another person toileting, which includes using toliet, bedpan, or urinal?: A Lot Help from another person bathing (including washing, rinsing, drying)?: A Lot Help from another person to put on and taking off regular upper body clothing?: A Little Help from another person to put on and taking off regular lower body clothing?: A Lot 6 Click Score: 16    End of Session Equipment Utilized During Treatment: Rolling walker (2 wheels)  OT Visit Diagnosis: Other abnormalities of gait and mobility (R26.89);Muscle weakness (generalized) (M62.81);Pain Pain - Right/Left: Left Pain - part of body: Ankle and joints of foot   Activity Tolerance Patient tolerated treatment well   Patient Left in chair;with call bell/phone within reach;with chair alarm set   Nurse Communication Mobility status        Time: 0981-1914 OT Time Calculation (min): 19 min  Charges: OT General Charges $OT Visit: 1 Visit OT Evaluation $OT Re-eval: 1 Re-eval OT Treatments $Self Care/Home Management : 8-22 mins  Delmer Ferraris, OT  Acute Rehabilitation Services Office [phone] Secure chat preferred   [name] 11/14/2023, 12:20 PM

## 2023-11-14 NOTE — Discharge Summary (Signed)
 Physician Discharge Summary  [name] VHQ:[id number] DOB: June 26, 1961 DOA: 11/11/2023  PCP: Mercy Stall, MD  Admit date: 11/11/2023 Discharge date: 11/14/2023 30 Day Unplanned Readmission Risk Score    Flowsheet Row Admission (Current) from 11/11/2023 in [facility]  30 Day Unplanned Readmission Risk Score (%) 48.54 Filed at 11/14/2023 0801       This score is the patient's risk of an unplanned readmission within 30 days of being discharged (0 -100%). The score is based on dignosis, age, lab data, medications, orders, and past utilization.   Low:  0-14.9   Medium: 15-21.9   High: 22-29.9   Extreme: 30 and above          Admitted From: Home Disposition: Home  Recommendations for Outpatient Follow-up:  Follow up with PCP in 1-2 weeks Please obtain BMP/CBC in one week For with orthopedics in 2 weeks Please follow up with your PCP on the following pending results: Unresulted Labs (From admission, onward)    None         Home Health: Yes Equipment/Devices: None  Discharge Condition: Stable CODE STATUS: Full code Diet recommendation: Diabetic  Subjective: Seen and examined.  Doing well.  Left lower extremity pain is very well-controlled.  She feels well and prefers to go home.  Brief/Interim Summary: Dana [name] is a 63 y.o. female with medical history significant of hypertension, hyperlipidemia, atrial fibrillation status post ablation, CAD, GERD, CKD 4, diabetes, neuropathy, chronic combined systolic and diastolic CHF, anxiety, depression, obesity, OSA, chronic respiratory failure with hypoxia, anemia, cardiac arrest, chronic pain, toe amputation, gout was directly admitted from Ortho clinic for surgical fixation of the left trimalleolar ankle fracture and dislocation.   Patient was initially seen in the ED on 4/22 due to a fall when getting out of her recliner with a twisting motion.  She was diagnosed with trimalleolar fracture  which was reduced and splinted and then patient was discharged with orthopedic follow-up.   Since that time patient has had trouble with maintaining nonweightbearing.  After reevaluating in orthopedic office 11/11/2023, decision was made to directly admit patient for surgical correction.  Details below.   Trimalleolar ankle fracture, left, POA: > Patient presenting from orthopedic office for surgical correction of left trimalleolar ankle fracture.  Please see details above.  Patient underwent following procedures on 11/13/2023.  She was seen by PT OT today, they recommended home health PT.  Patient feels comfortable going home.  Orthopedics cleared her for discharge.  They will follow-up with her outpatient. Procedures: CPT 27870-Left tibiotalar fusion CPT 28725-Left subtalar fusion CPT 27606-Percutaneous Achilles lengthening of left ankle   CAD She has had extensive CAD with prior PCI's and multiple evidence of in-stent restenosis in the proximal LAD with last intervention January 2025.   - scoring balloon angioplasty mid LAD 07/2023 - she has been on DAPT with ASA and brilinta  which were held prior to surgery per cardiology recommendations but now they have been resumed postsurgery.  - continue coreg ,    Chronic combined systolic and diastolic CHF / Status post MitraClip: > Last echo was in February of this year with EF 40 to 45%, indeterminate diastolic function, poorly visualized RV function.  Status post MitraClip.  Some wall motion abnormality was noted. > Certainly there is a ischemic component to this given the above. > Follows with advanced heart failure clinic - Continue home torsemide , carvedilol , Imdur    Hypertension - Blood pressure very well-controlled, continue home torsemide , carvedilol ,  Imdur    Hyperlipidemia - Continue Zetia  - On Repatha  outpatient   Atrial fibrillation > Status post ablation - Not currently on anticoagulation - Is taking Coreg  for comorbid  conditions   GERD - Continue PPI   CKD 4 - Baseline creatinine between 2-2.6.  Currently at baseline.  Trend renal function and electrolytes   Diabetes melitis type II,: > Last hemoglobin A1c 10.2 on 11/12/2023.  On 33-35 units long-acting outpatient and low-dose with meals.  Currently on 30 units Semglee  and SSI.  Significantly hyperglycemic with blood sugar in 400 range, she admits to eating 2 ice creams yesterday however with her hemoglobin A1c being 10.2 indicates that she was uncontrolled even prior while taking 33 units of long-acting insulin  which she says that she reduced by herself, I am going to send her on 45 units of long-acting insulin .  I have discussed that with her.  Highly recommend follow-up with endocrinologist.   Neuropathy - Continue home gabapentin    Anxiety Depression - Continue home doxepin  and as needed Ativan    Obesity class III, POA: - BMI 36.  Weight loss and diet modification counseled.   OSA - Continue CPAP   Chronic respiratory failure with hypoxia - On chronic 2 L   Anemia of chronic disease - Baseline hemoglobin appears to be anywhere between 8.5-10.5.  Currently hemoglobin is 7.9.  Trend CBC   Gout - Continue home allopurinol    History of cardiac arrest in 2022 History of toe amputation - Noted    Mild hyponatremia: Stable.   Discharge plan was discussed with patient and/or family member and they verbalized understanding and agreed with it.  Discharge Diagnoses:  Principal Problem:   Trimalleolar fracture of ankle, closed Active Problems:   Anemia in CKD (chronic kidney disease)   Chronic pain syndrome   Diabetic polyneuropathy associated with type 2 diabetes mellitus (HCC)   GERD without esophagitis   Hyperlipidemia LDL goal <70   S/P ablation of atrial fibrillation   Class 2 severe obesity due to excess calories with serious comorbidity and body mass index (BMI) of 39.0 to 39.9 in adult [facility])   Hypertensive heart and renal disease  with congestive heart failure (HCC)   Lumbar back pain   Chronic respiratory failure with hypoxia (HCC)   Chronic gout without tophus   OSA on CPAP   Insulin  dependent type 2 diabetes mellitus (HCC)   Vitamin D  insufficiency   S/P mitral valve clip implantation   GAD (generalized anxiety disorder)   Mixed hyperlipidemia   Unstable angina (HCC)   CKD stage 4 due to type 1 diabetes mellitus (HCC)   History of amputation of hallux (HCC)   Moderate recurrent major depression (HCC)   Coronary artery disease involving native coronary artery of native heart with angina pectoris [facility])    Discharge Instructions   Allergies as of 11/14/2023       Reactions   Naproxen Hives, Rash   Shellfish-derived Products Hives, Swelling, Other (See Comments)   Angioedema   Strawberry (diagnostic) Hives   Celecoxib Other (See Comments)   Stomach pain   Glucosamine Hives, Swelling, Other (See Comments)   Angioedema   Iron  Hives, Other (See Comments)   IV iron  transfusion - hives - Feb 2022 hospitalization Patient said she CAN tolerate if given Benadryl  first   Metoclopramide Other (See Comments)   Facial twitching and stuttering   Metolazone  Other (See Comments)   Acute renal failure   Statins Other (See Comments)   Muscle pain  Medication List     STOP taking these medications    oxyCODONE  5 MG immediate release tablet Commonly known as: Roxicodone        TAKE these medications    acetaminophen  500 MG tablet Commonly known as: TYLENOL  Take 1,000 mg by mouth every 6 (six) hours as needed for mild pain (pain score 1-3) or headache.   allopurinol  100 MG tablet Commonly known as: ZYLOPRIM  Take 0.5 tablets (50 mg total) by mouth daily.   Aspirin  Low Dose 81 MG tablet Generic drug: aspirin  EC Take 1 tablet (81 mg total) by mouth daily. Swallow whole.   BD Pen Needle Nano 2nd Gen 32G X 4 MM Misc Generic drug: Insulin  Pen Needle Inject 1 each into the skin in the morning, at  noon, in the evening, and at bedtime.   Brilinta  90 MG Tabs tablet Generic drug: ticagrelor  Take 1 tablet (90 mg total) by mouth 2 (two) times daily.   carvedilol  6.25 MG tablet Commonly known as: COREG  Take 1.5 tablets (9.375 mg total) by mouth 2 (two) times daily.   doxepin  150 MG capsule Commonly known as: SINEQUAN  Take 1 capsule (150 mg total) by mouth at bedtime.   EPINEPHrine  0.3 mg/0.3 mL Soaj injection Commonly known as: EPI-PEN Use as directed for life-threatening allergic reaction.   ezetimibe  10 MG tablet Commonly known as: ZETIA  Take 1 tablet (10 mg total) by mouth every evening.   FISH OIL PO Take 1 capsule by mouth daily.   FreeStyle Libre 2 Sensor Misc Inject 1 device into skin every 14 (fourteen) days.   gabapentin  300 MG capsule Commonly known as: Neurontin  Take 1 capsule (300 mg total) by mouth 3 (three) times daily.   Gvoke HypoPen  2-Pack 0.5 MG/0.1ML Soaj Generic drug: Glucagon  Inject 0.5 mg into the skin daily as needed. What changed: reasons to take this   HYDROcodone -acetaminophen  7.5-325 MG tablet Commonly known as: NORCO Take 1 tablet by mouth 3 (three) times daily as needed. What changed: reasons to take this   [facility] Extra Strength 10-30 % Crea Apply 1 Application topically as needed (bilateral hips and knees).   isosorbide  mononitrate 30 MG 24 hr tablet Commonly known as: IMDUR  Take 1 tablet (30 mg total) by mouth daily.   Lantus  SoloStar 100 UNIT/ML Solostar Pen Generic drug: insulin  glargine Inject 45 Units into the skin daily. What changed: how much to take   latanoprost  0.005 % ophthalmic solution Commonly known as: XALATAN  Place 1 drop into both eyes at bedtime.   Linzess  145 MCG Caps capsule Generic drug: linaclotide  Take 1 capsule (145 mcg total) by mouth daily before breakfast.   LORazepam  0.5 MG tablet Commonly known as: ATIVAN  Take 1 tablet (0.5 mg total) by mouth daily as needed for anxiety.   magnesium  oxide  400 MG tablet Commonly known as: MAG-OX Take 2 tablets (800 mg total) by mouth daily. Take 2 (400 mg) daily=800 mg   methocarbamol  500 MG tablet Commonly known as: ROBAXIN  Take 1 tablet (500 mg total) by mouth every 6 (six) hours as needed for muscle spasms.   nitroGLYCERIN  0.4 MG SL tablet Commonly known as: NITROSTAT  DISSOLVE ONE TABLET UNDER THE TONGUE EVERY 5 MINUTES AS NEEDED FOR CHEST PAIN.  DO NOT EXCEED A TOTAL OF 3 DOSES IN 15 MINUTES   NovoLOG  FlexPen 100 UNIT/ML FlexPen Generic drug: insulin  aspart Inject 2 - 10 Units before meals based on SSI.   orphenadrine  100 MG tablet Commonly known as: NORFLEX  Take 1 tablet (100  mg total) by mouth 2 (two) times daily.   OXYGEN  Inhale 2 L/min into the lungs at bedtime.   pantoprazole  40 MG tablet Commonly known as: PROTONIX  Take 1 tablet (40 mg total) by mouth daily.   potassium chloride  SA 20 MEQ tablet Commonly known as: KLOR-CON  M Take 1 tablet (20 mEq total) by mouth daily.   Probiotic Caps Take 1 capsule by mouth daily.   promethazine  25 MG tablet Commonly known as: PHENERGAN  Take 1 tablet (25 mg total) by mouth daily as needed. What changed: reasons to take this   Repatha  SureClick 140 MG/ML Soaj Generic drug: Evolocumab  Inject 140 mg into the skin every 14 (fourteen) days.   torsemide  20 MG tablet Commonly known as: DEMADEX  Take 3 tablets (60 mg total) by mouth daily.   vitamin D3 25 MCG tablet Commonly known as: CHOLECALCIFEROL  Take 2 tablets (2,000 Units total) by mouth 2 (two) times daily.        Follow-up Information     Cox, Burleigh Carp, MD Follow up.   Specialty: Family Medicine Contact information: [address] [place] [address] [phone]         Laneta Pintos, MD. Schedule an appointment as soon as possible for a visit in 2 week(s).   Specialty: Orthopedic Surgery Why: for wound check and repeat x-rays Contact information: [address] [place]  [address] [phone]         Mercy Stall, MD Follow up in 1 week(s).   Specialty: Family Medicine Contact information: [address] [place] [address] [phone]                Allergies  Allergen Reactions   Naproxen Hives and Rash   Shellfish-Derived Products Hives, Swelling and Other (See Comments)    Angioedema    Strawberry (Diagnostic) Hives   Celecoxib Other (See Comments)    Stomach pain   Glucosamine Hives, Swelling and Other (See Comments)    Angioedema    Iron  Hives and Other (See Comments)    IV iron  transfusion - hives - Feb 2022 hospitalization  Patient said she CAN tolerate if given Benadryl  first   Metoclopramide Other (See Comments)    Facial twitching and stuttering   Metolazone  Other (See Comments)    Acute renal failure   Statins Other (See Comments)    Muscle pain    Consultations: Orthopedics and cardiology   Procedures/Studies: DG Ankle Complete Left Result Date: 11/13/2023 CLINICAL DATA:  Fracture. EXAM: LEFT ANKLE COMPLETE - 3+ VIEW COMPARISON:  Fluoroscopic images of same day. FINDINGS: Status post surgical fusion of the talotibial and talocalcaneal joints. Moderately displaced fractures involving distal left fibula and medial malleolus are again noted. Vascular calcifications are noted. IMPRESSION: Postsurgical changes as noted above. Electronically Signed   By: Rosalene Colon M.D.   On: 11/13/2023 15:19   DG Ankle Complete Left Result Date: 11/13/2023 CLINICAL DATA:  Elective surgery. EXAM: LEFT ANKLE COMPLETE - 3+ VIEW COMPARISON:  Preoperative imaging FINDINGS: Ten fluoroscopic spot views of the left ankle submitted from the operating room. Rod traverses the calcaneus, talus, and distal tibia with calcaneal and tibial locking screw fixation. Screws also traverse the posterior subtalar joint. Distal tibia and fibular fractures are again seen. Fluoroscopy time 1 minutes 30 seconds. Dose 2.62 mGy. IMPRESSION:  Intraoperative fluoroscopy during ankle surgery. Electronically Signed   By: Chadwick Colonel M.D.   On: 11/13/2023 13:40   DG C-Arm 1-60 Min-No  Report Result Date: 11/13/2023 Fluoroscopy was utilized by the requesting physician.  No radiographic interpretation.   DG C-Arm 1-60 Min-No Report Result Date: 11/13/2023 Fluoroscopy was utilized by the requesting physician.  No radiographic interpretation.   CT Head Wo Contrast Result Date: 11/05/2023 CLINICAL DATA:  Unknown cause of mental status change EXAM: CT HEAD WITHOUT CONTRAST TECHNIQUE: Contiguous axial images were obtained from the base of the skull through the vertex without intravenous contrast. RADIATION DOSE REDUCTION: This exam was performed according to the departmental dose-optimization program which includes automated exposure control, adjustment of the mA and/or kV according to patient size and/or use of iterative reconstruction technique. COMPARISON:  01/07/2018 FINDINGS: Brain: Hypoattenuation within the right basal ganglia (series 2/image 17). This is new from 2019 but favored to represent a chronic infarct. No intracranial hemorrhage or mass effect. No hydrocephalus. No extra-axial fluid collection. Vascular: No hyperdense vessel or unexpected calcification. Skull: No fracture or focal lesion. Sinuses/Orbits: Near complete opacification of the left sphenoid sinus with bony hyperostosis. Paranasal sinuses and mastoid air cells are otherwise well aerated. Globes are intact. Other: None. IMPRESSION: 1. Hypoattenuation within the right basal ganglia is new from 2019 but favored to represent a chronic infarct (there is mention of signal changes in the right basal ganglia on report from MRI 01/25/2021, however no images are available for comparison). If there is concern for acute infarct, MRI is recommended. 2. Chronic left sphenoid sinusitis. Electronically Signed   By: Rozell Cornet M.D.   On: 11/05/2023 23:32   CT Ankle Left Wo  Contrast Result Date: 11/05/2023 CLINICAL DATA:  Ankle trauma. Fell 1 week ago. Assess complex ankle fractures. EXAM: CT OF THE LEFT ANKLE WITHOUT CONTRAST TECHNIQUE: Multidetector CT imaging of the left ankle was performed according to the standard protocol. Multiplanar CT image reconstructions were also generated. RADIATION DOSE REDUCTION: This exam was performed according to the departmental dose-optimization program which includes automated exposure control, adjustment of the mA and/or kV according to patient size and/or use of iterative reconstruction technique. COMPARISON:  Radiographs, same date. FINDINGS: Trimalleolar ankle fractures as demonstrated on the radiographs. Comminuted and mildly displaced distal fibular shaft fracture at and above the level of the ankle mortise. Maximum displacement is 5 mm. Complex comminuted fracture of the medial malleolus both at and above the level of the ankle mortise. No significant displacement. Mildly comminuted impaction type fracture involving the posterior malleolar region of the tibia with small articular bone fragments but no large displaced fragment. The medial mortise is widened. The talus is intact. No visualized mid or hindfoot fractures. Large ankle joint effusion/hemarthrosis. Grossly by CT the major tendons are intact. Extensive small vessel calcifications consistent diabetes. IMPRESSION: 1. Trimalleolar ankle fractures as demonstrated on the radiographs. 2. Comminuted and mildly displaced distal fibular shaft fracture at and above the level of the ankle mortise. 3. Complex comminuted fracture of the medial malleolus both at and above the level of the ankle mortise. No significant displacement. 4. Mildly comminuted impaction type fracture involving the posterior malleolar region of the tibia with small articular bone fragments but no large displaced fragment. 5. The medial mortise is widened. 6. Large ankle joint effusion/hemarthrosis. 7. Extensive small  vessel calcifications consistent with diabetes. Electronically Signed   By: Marrian Siva M.D.   On: 11/05/2023 23:28   DG Ankle Complete Left Result Date: 11/05/2023 CLINICAL DATA:  Ankle pain after fall 1 week ago EXAM: LEFT ANKLE COMPLETE - 3+ VIEW COMPARISON:  None Available. FINDINGS: Acute mildly  displaced fracture of the medial and lateral malleoli. The dominant lateral malleolar fragment is displaced laterally. Fracture lines extend into the ankle mortise. Mildly displaced fracture of the posterior malleolus. Marked soft tissue swelling about the ankle. IMPRESSION: Acute trimalleolar fracture. Electronically Signed   By: Rozell Cornet M.D.   On: 11/05/2023 20:51   DG Lumbar Spine Complete Result Date: 11/01/2023 EXAM: 4 VIEWS XRAY OF THE LUMBAR SPINE 04/18/202511:53:00 AM COMPARISON: None available. CLINICAL HISTORY: Lumbar back pain. Complains of lower back pain for2 weeks that radiates down bilateral legs. Pain affects patient's range of motion and walking. FINDINGS: BONES: 5 lumbar-type vertebral bodies. No acute fracture or aggressive appearing osseous lesion. No acute spondylolisthesis. DISCS AND DEGENERATIVE CHANGES: No severe degenerative changes. SOFT TISSUES: No acute abnormality. Status post cholecystectomy. Vascular calcifications. IMPRESSION: 1. No acute abnormality of the lumbar spine. Electronically signed by: Zadie Herter MD 11/01/2023 01:52 PM EDT RP Workstation: [id number]     Discharge Exam: Vitals:   11/14/23 0456 11/14/23 0746  BP: (!) 122/59 (!) 130/55  Pulse: 63 65  Resp: 17 19  Temp: 98.1 F (36.7 C) (!) 97.4 F (36.3 C)  SpO2: 98% 90%   Vitals:   11/13/23 1932 11/13/23 2330 11/14/23 0456 11/14/23 0746  BP: (!) 121/53 (!) 132/58 (!) 122/59 (!) 130/55  Pulse: 73 75 63 65  Resp: 17 18 17 19   Temp: 98.6 F (37 C) 98 F (36.7 C) 98.1 F (36.7 C) (!) 97.4 F (36.3 C)  TempSrc:  Oral Oral Oral  SpO2: 97% 98% 98% 90%  Weight:      Height:         General: Pt is alert, awake, not in acute distress Cardiovascular: RRR, S1/S2 +, no rubs, no gallops Respiratory: CTA bilaterally, no wheezing, no rhonchi Abdominal: Soft, NT, ND, bowel sounds + Extremities: no edema, no cyanosis    The results of significant diagnostics from this hospitalization (including imaging, microbiology, ancillary and laboratory) are listed below for reference.     Microbiology: Recent Results (from the past 240 hours)  Surgical pcr screen     Status: None   Collection Time: 11/12/23  3:36 PM   Specimen: Nasal Mucosa; Nasal Swab  Result Value Ref Range Status   MRSA, PCR NEGATIVE NEGATIVE Final   Staphylococcus aureus NEGATIVE NEGATIVE Final    Comment: (NOTE) The Xpert SA Assay (FDA approved for NASAL specimens in patients 55 years of age and older), is one component of a comprehensive surveillance program. It is not intended to diagnose infection nor to guide or monitor treatment. Performed at [facility], [address], [place], [place] [address]      Labs: BNP (last 3 results) Recent Labs    12/08/22 0159 07/13/23 1325 08/26/23 2233  BNP 149.2* 212.4* 511.3*   Basic Metabolic Panel: Recent Labs  Lab 11/11/23 1512 11/12/23 0619 11/13/23 0601 11/14/23 0021 11/14/23 0521  NA 132* 134* 133*  --  132*  K 4.5 5.1 4.4  --  5.2*  CL 97* 99 99  --  98  CO2 24 23 23   --  24  GLUCOSE 301* 174* 175* 513* 433*  BUN 88* 84* 86*  --  87*  CREATININE 2.57* 2.43* 2.58*  --  2.67*  CALCIUM  8.7* 8.9 8.4*  --  8.8*  MG 1.8  --   --   --   --    Liver Function Tests: Recent Labs  Lab 11/11/23 1512 11/12/23 0619  AST 9*  9*  ALT 8 8  ALKPHOS 98 100  BILITOT 0.8 0.8  PROT 7.3 7.8  ALBUMIN  2.3* 2.4*   No results for input(s): "LIPASE", "AMYLASE" in the last 168 hours. No results for input(s): "AMMONIA" in the last 168 hours. CBC: Recent Labs  Lab 11/11/23 1512 11/12/23 0619 11/13/23 0601 11/14/23 0521  WBC 15.3* 16.6*  13.8* 14.4*  NEUTROABS  --   --  10.8* 12.8*  HGB 8.3* 8.8* 7.9* 7.8*  HCT 26.1* 27.4* 24.9* 24.8*  MCV 84.7 83.8 84.1 85.5  PLT 340 373 321 322   Cardiac Enzymes: No results for input(s): "CKTOTAL", "CKMB", "CKMBINDEX", "TROPONINI" in the last 168 hours. BNP: Invalid input(s): "POCBNP" CBG: Recent Labs  Lab 11/13/23 2147 11/13/23 2328 11/14/23 0115 11/14/23 0455 11/14/23 0748  GLUCAP 434* 445* 481* 409* 328*   D-Dimer No results for input(s): "DDIMER" in the last 72 hours. Hgb A1c Recent Labs    11/12/23 0619  HGBA1C 10.2*   Lipid Profile No results for input(s): "CHOL", "HDL", "LDLCALC", "TRIG", "CHOLHDL", "LDLDIRECT" in the last 72 hours. Thyroid  function studies No results for input(s): "TSH", "T4TOTAL", "T3FREE", "THYROIDAB" in the last 72 hours.  Invalid input(s): "FREET3" Anemia work up No results for input(s): "VITAMINB12", "FOLATE", "FERRITIN", "TIBC", "IRON ", "RETICCTPCT" in the last 72 hours. Urinalysis    Component Value Date/Time   COLORURINE YELLOW 08/27/2023 0423   APPEARANCEUR HAZY (A) 08/27/2023 0423   LABSPEC 1.012 08/27/2023 0423   PHURINE 5.0 08/27/2023 0423   GLUCOSEU 50 (A) 08/27/2023 0423   HGBUR SMALL (A) 08/27/2023 0423   BILIRUBINUR negative 10/22/2023 0916   BILIRUBINUR negative 07/11/2021 0812   KETONESUR negative 10/22/2023 0916   KETONESUR NEGATIVE 08/27/2023 0423   PROTEINUR =100 (A) 10/22/2023 0916   PROTEINUR >=300 (A) 08/27/2023 0423   UROBILINOGEN 0.2 10/22/2023 0916   NITRITE Negative 10/22/2023 0916   NITRITE NEGATIVE 08/27/2023 0423   LEUKOCYTESUR Negative 10/22/2023 0916   LEUKOCYTESUR NEGATIVE 08/27/2023 0423   Sepsis Labs Recent Labs  Lab 11/11/23 1512 11/12/23 0619 11/13/23 0601 11/14/23 0521  WBC 15.3* 16.6* 13.8* 14.4*   Microbiology Recent Results (from the past 240 hours)  Surgical pcr screen     Status: None   Collection Time: 11/12/23  3:36 PM   Specimen: Nasal Mucosa; Nasal Swab  Result Value Ref  Range Status   MRSA, PCR NEGATIVE NEGATIVE Final   Staphylococcus aureus NEGATIVE NEGATIVE Final    Comment: (NOTE) The Xpert SA Assay (FDA approved for NASAL specimens in patients 51 years of age and older), is one component of a comprehensive surveillance program. It is not intended to diagnose infection nor to guide or monitor treatment. Performed at [facility], [address] [place], [place] [address]     FURTHER DISCHARGE INSTRUCTIONS:   Get Medicines reviewed and adjusted: Please take all your medications with you for your next visit with your Primary MD   Laboratory/radiological data: Please request your Primary MD to go over all hospital tests and procedure/radiological results at the follow up, please ask your Primary MD to get all Hospital records sent to his/her office.   In some cases, they will be blood work, cultures and biopsy results pending at the time of your discharge. Please request that your primary care M.D. goes through all the records of your hospital data and follows up on these results.   Also Note the following: If you experience worsening of your admission symptoms, develop shortness of breath, life threatening emergency,  suicidal or homicidal thoughts you must seek medical attention immediately by calling 911 or calling your MD immediately  if symptoms less severe.   You must read complete instructions/literature along with all the possible adverse reactions/side effects for all the Medicines you take and that have been prescribed to you. Take any new Medicines after you have completely understood and accpet all the possible adverse reactions/side effects.    Do not drive when taking Pain medications or sleeping medications (Benzodaizepines)   Do not take more than prescribed Pain, Sleep and Anxiety Medications. It is not advisable to combine anxiety,sleep and pain medications without talking with your primary care practitioner   Special  Instructions: If you have smoked or chewed Tobacco  in the last 2 yrs please stop smoking, stop any regular Alcohol  and or any Recreational drug use.   Wear Seat belts while driving.   Please note: You were cared for by a hospitalist during your hospital stay. Once you are discharged, your primary care physician will handle any further medical issues. Please note that NO REFILLS for any discharge medications will be authorized once you are discharged, as it is imperative that you return to your primary care physician (or establish a relationship with a primary care physician if you do not have one) for your post hospital discharge needs so that they can reassess your need for medications and monitor your lab values  Time coordinating discharge: Over 30 minutes  SIGNED:   Modena Andes, MD  [facility] 11/14/2023, 11:36 AM *Please note that this is a verbal dictation therefore any spelling or grammatical errors are due to the "Dragon Medical One" system interpretation. If 7PM-7AM, please contact night-coverage [email or web address]

## 2023-11-14 NOTE — Inpatient Diabetes Management (Signed)
 Inpatient Diabetes Program Recommendations  AACE/ADA: New Consensus Statement on Inpatient Glycemic Control (2015)  Target Ranges:  Prepandial:   less than 140 mg/dL      Peak postprandial:   less than 180 mg/dL (1-2 hours)      Critically ill patients:  140 - 180 mg/dL   Lab Results  Component Value Date   GLUCAP 328 (H) 11/14/2023   HGBA1C 10.2 (H) 11/12/2023    Review of Glycemic Control  Latest Reference Range & Units 11/13/23 06:27 11/13/23 10:32 11/13/23 13:31 11/13/23 14:21 11/13/23 16:10 11/13/23 21:47 11/13/23 23:28 11/14/23 01:15 11/14/23 04:55 11/14/23 07:48  Glucose-Capillary 70 - 99 mg/dL 161 (H) 096 (H) 045 (H) 194 (H) 220 (H) 434 (H) 445 (H) 481 (H) 409 (H) 328 (H)   Diabetes history: DM 2 Outpatient Diabetes medications: Lantus  30 units Daily, Novolog  2-10 tid Current orders for Inpatient glycemic control:  Semglee  45 units Daily Novolog  0-20 units tid + hs Novolog  3 units tid meal coverage  A1c 10.2% on 4/29  Note: Decadron  10 mg given yesterday increasing glucose trends.  Insulin  increased today. Will watch trends.  Thanks,  Eloise Hake RN, MSN, BC-ADM Inpatient Diabetes Coordinator Team Pager [phone] (8a-5p)

## 2023-11-14 NOTE — Progress Notes (Signed)
 Paged Bailey Lesser NP because Pt CBG 434. New orders given and administered. Paged again for CBG 481. New orders given and will observe pt and repeat glucose check at 0430.

## 2023-11-14 NOTE — TOC Transition Note (Addendum)
 Transition of Care [facility]) - Discharge Note   Patient Details  Name: Dana [name] MRN: [id number] Date of Birth: 1961-05-08  [facility] CM/SW Contact:  Alisa App, RN Phone Number: 11/14/2023, 1:12 PM   Clinical Narrative:    Patient will DC to: home Anticipated DC date: 11/14/2023 Family notified: yes Transport by: car      -s/p  HINDFOOT FUSION FIXATION OFLEFT ANKLE , 4/30 Per MD patient ready for DC today. RN, patient, and patient's husband  notified of DC. Pt agreeable to home health services. Referral made with [facility] and accepted. Pt without RX med concern.  Post hospital f/u noted on AVS. Pt without DME needs., already has @ home pt states. Husband to provide transportation to home.  RNCM will sign off for now as intervention is no longer needed. Please consult us  again if new needs arise.   Final next level of care: Home w Home Health Services Barriers to Discharge: No Barriers Identified   Patient Goals and CMS Choice     Choice offered to / list presented to : Patient      Discharge Placement                       Discharge Plan and Services Additional resources added to the After Visit Summary for                                       Social Drivers of Health (SDOH) Interventions SDOH Screenings   Food Insecurity: No Food Insecurity (11/11/2023)  Housing: Low Risk  (11/11/2023)  Transportation Needs: No Transportation Needs (11/11/2023)  Utilities: Not At Risk (11/11/2023)  Alcohol Screen: Low Risk  (10/24/2023)  Depression (PHQ2-9): High Risk (10/24/2023)  Financial Resource Strain: Low Risk  (10/24/2023)  Physical Activity: Insufficiently Active (10/24/2023)  Social Connections: Moderately Integrated (10/24/2023)  Stress: Stress Concern Present (10/24/2023)  Tobacco Use: Medium Risk (11/13/2023)  Health Literacy: Adequate Health Literacy (10/24/2023)     Readmission Risk Interventions    07/19/2023    3:45 PM  12/16/2022   11:54 AM 05/19/2021    1:11 PM  Readmission Risk Prevention Plan  Transportation Screening Complete Complete Complete  Medication Review (RN Care Manager) Referral to Pharmacy Referral to Pharmacy Complete  PCP or Specialist appointment within 3-5 days of discharge  Complete Complete  HRI or Home Care Consult Complete Complete Complete  SW Recovery Care/Counseling Consult Complete Complete Complete  Palliative Care Screening Not Applicable Not Applicable Not Applicable  Skilled Nursing Facility Not Applicable Not Applicable Not Applicable

## 2023-11-14 NOTE — Progress Notes (Signed)
 Physical Therapy Treatment Patient Details Name: Dana [name] MRN: [id number] DOB: 1960-08-28 Today's Date: 11/14/2023   History of Present Illness Pt is a 63 y/o female presenting on 4/28 after ground level fall on 4/22. Found with L trimalleolar ankle fx in ED on 4/22 and presented to orthopedic office on 4/28 with dislocation of L ankle as unable to comply with NWB restrictions.L tibiotaalar & subtalar fusion, lengthening of achilles performed 4/30.   PMH of diastolic heart failure, anoxic brain injury, COPD, RA, CVA, CAD, DES RCA/LAD 2018, DMI, HTN, PAD, L toe amputation, and OSA.    PT Comments  PT focus of session on transfer training s/p L ankle fusion. Pt is now WBAT LLE for transfers only. Pt tolerating x3 transfers into and out of recliner, pt demonstrating proficiency in stand, squat, and scoot pivot with standby assist and min cues for sequencing. PT explained that her WBAT LLE is only for transfers, she is not to walk with LLE at all. Pt expresses understanding, states she will set up home environment for transfer-level mobility. Pt to d/c home soon.     If plan is discharge home, recommend the following: A little help with walking and/or transfers;A little help with bathing/dressing/bathroom   Can travel by private vehicle        Equipment Recommendations  None recommended by PT    Recommendations for Other Services       Precautions / Restrictions Precautions Precautions: Fall Recall of Precautions/Restrictions: Intact Precaution/Restrictions Comments: CAM boot Restrictions Weight Bearing Restrictions Per Provider Order: Yes LLE Weight Bearing Per Provider Order: Weight bearing as tolerated Other Position/Activity Restrictions: transfers only     Mobility  Bed Mobility Overal bed mobility: Needs Assistance Bed Mobility: Supine to Sit     Supine to sit: Supervision     General bed mobility comments: increased time, use of bedrails    Transfers Overall  transfer level: Needs assistance Equipment used: Rolling walker (2 wheels), None Transfers: Bed to chair/wheelchair/BSC, Sit to/from Stand Sit to Stand: Contact guard assist Stand pivot transfers: Contact guard assist   Squat pivot transfers: Contact guard assist    Lateral/Scoot Transfers: Contact guard assist General transfer comment: close guard for safety, practiced x3 different transfer techniques to ensure proficiency with each. WBAT LLE for transfers only, use of RW with stand pivot and no AD used for scoot or squat pivot    Ambulation/Gait                   Stairs             Wheelchair Mobility     Tilt Bed    Modified Rankin (Stroke Patients Only)       Balance Overall balance assessment: Needs assistance, History of Falls Sitting-balance support: Feet supported, Bilateral upper extremity supported Sitting balance-Leahy Scale: Good     Standing balance support: Bilateral upper extremity supported, During functional activity, Reliant on assistive device for balance Standing balance-Leahy Scale: Poor                              Communication Communication Communication: No apparent difficulties  Cognition Arousal: Alert Behavior During Therapy: WFL for tasks assessed/performed   PT - Cognitive impairments: No apparent impairments                         Following commands: Intact      Cueing  Cueing Techniques: Verbal cues, Gestural cues  Exercises      General Comments General comments (skin integrity, edema, etc.): Vss on ra      Pertinent Vitals/Pain Pain Assessment Pain Assessment: Faces Faces Pain Scale: Hurts little more Pain Location: L foot Pain Descriptors / Indicators: Sore, Throbbing Pain Intervention(s): Limited activity within patient's tolerance, Monitored during session, Repositioned    Home Living                          Prior Function            PT Goals (current goals can  now be found in the care plan section) Acute Rehab PT Goals PT Goal Formulation: With patient Time For Goal Achievement: 11/26/23 Potential to Achieve Goals: Good Progress towards PT goals: Progressing toward goals    Frequency    Min 1X/week      PT Plan      Co-evaluation              AM-PAC PT "6 Clicks" Mobility   Outcome Measure  Help needed turning from your back to your side while in a flat bed without using bedrails?: A Little Help needed moving from lying on your back to sitting on the side of a flat bed without using bedrails?: A Little Help needed moving to and from a bed to a chair (including a wheelchair)?: A Little Help needed standing up from a chair using your arms (e.g., wheelchair or bedside chair)?: A Little Help needed to walk in hospital room?: Total Help needed climbing 3-5 steps with a railing? : Total 6 Click Score: 14    End of Session Equipment Utilized During Treatment: Other (comment) (L CAM boot) Activity Tolerance: Patient limited by fatigue;Patient limited by pain Patient left: in chair;with call bell/phone within reach;with chair alarm set Nurse Communication: Mobility status PT Visit Diagnosis: Other abnormalities of gait and mobility (R26.89);Muscle weakness (generalized) (M62.81)     Time: 6578-4696 PT Time Calculation (min) (ACUTE ONLY): 16 min  Charges:    $Therapeutic Activity: 8-22 mins PT General Charges $$ ACUTE PT VISIT: 1 Visit                     Shirlene Doughty, PT DPT Acute Rehabilitation Services Secure Chat Preferred  Office [phone]    Charlcie Prisco E Burnadette Carrion 11/14/2023, 2:29 PM

## 2023-11-14 NOTE — Progress Notes (Signed)
 Orthopaedic Trauma Progress Note  SUBJECTIVE: Doing well this morning.  Just finished working with therapies and states this went extremely well.  She is able to stand pivot transfer to bedside chair.  No chest pain. No SOB. No nausea/vomiting. No other complaints.  Tolerating diet and fluids.  Noted to have glucose of 433 this morning.  Patient notes that she had 2 cups of ice cream last night followed by another 2 cups about an hour later which likely contributed to her hyperglycemia.  We discussed the importance of tight blood sugar control at discharge to assist with bone and wound healing.  Patient notes she has a walker, wheelchair, bedside commode at home.  Also has a prescription for hydrocodone , does not need to refill this at discharge.  OBJECTIVE:  Vitals:   11/14/23 0456 11/14/23 0746  BP: (!) 122/59 (!) 130/55  Pulse: 63 65  Resp: 17 19  Temp: 98.1 F (36.7 C) (!) 97.4 F (36.3 C)  SpO2: 98% 90%    Opiates Today (MME): Today's  total administered Morphine  Milligram Equivalents: 0 Opiates Yesterday (MME): Yesterday's total administered Morphine  Milligram Equivalents: 45  General: Sitting up in bedside chair, no acute distress Respiratory: No increased work of breathing.  Operative Extremity (LLE): Cam boot in place.  Dressing clean, dry, intact. Well-healed toe amputations noted.  No sensation from knee down to foot.  Endorses sensation to light touch over the knee and thigh.  Skin warm and dry.  IMAGING: Stable post op imaging.   LABS:  Results for orders placed or performed during the hospital encounter of 11/11/23 (from the past 24 hours)  Type and screen [facility]     Status: None   Collection Time: 11/13/23 11:10 AM  Result Value Ref Range   ABO/RH(D) A NEG    Antibody Screen NEG    Sample Expiration      11/16/2023,2359 Performed at [facility], [address], [place], [place] [address]   Glucose, capillary     Status: Abnormal    Collection Time: 11/13/23  1:31 PM  Result Value Ref Range   Glucose-Capillary 173 (H) 70 - 99 mg/dL  Glucose, capillary     Status: Abnormal   Collection Time: 11/13/23  2:21 PM  Result Value Ref Range   Glucose-Capillary 194 (H) 70 - 99 mg/dL   Comment 1 Notify RN    Comment 2 Document in Chart   Glucose, capillary     Status: Abnormal   Collection Time: 11/13/23  4:10 PM  Result Value Ref Range   Glucose-Capillary 220 (H) 70 - 99 mg/dL  Glucose, capillary     Status: Abnormal   Collection Time: 11/13/23  9:47 PM  Result Value Ref Range   Glucose-Capillary 434 (H) 70 - 99 mg/dL  Glucose, capillary     Status: Abnormal   Collection Time: 11/13/23 11:28 PM  Result Value Ref Range   Glucose-Capillary 445 (H) 70 - 99 mg/dL  Glucose, random     Status: Abnormal   Collection Time: 11/14/23 12:21 AM  Result Value Ref Range   Glucose, Bld 513 (HH) 70 - 99 mg/dL  Glucose, capillary     Status: Abnormal   Collection Time: 11/14/23  1:15 AM  Result Value Ref Range   Glucose-Capillary 481 (H) 70 - 99 mg/dL  Glucose, capillary     Status: Abnormal   Collection Time: 11/14/23  4:55 AM  Result Value Ref Range   Glucose-Capillary 409 (H)  70 - 99 mg/dL  CBC with Differential/Platelet     Status: Abnormal   Collection Time: 11/14/23  5:21 AM  Result Value Ref Range   WBC 14.4 (H) 4.0 - 10.5 K/uL   RBC 2.90 (L) 3.87 - 5.11 MIL/uL   Hemoglobin 7.8 (L) 12.0 - 15.0 g/dL   HCT 84.6 (L) 96.2 - 95.2 %   MCV 85.5 80.0 - 100.0 fL   MCH 26.9 26.0 - 34.0 pg   MCHC 31.5 30.0 - 36.0 g/dL   RDW 84.1 32.4 - 40.1 %   Platelets 322 150 - 400 K/uL   nRBC 0.0 0.0 - 0.2 %   Neutrophils Relative % 88 %   Neutro Abs 12.8 (H) 1.7 - 7.7 K/uL   Lymphocytes Relative 5 %   Lymphs Abs 0.8 0.7 - 4.0 K/uL   Monocytes Relative 6 %   Monocytes Absolute 0.8 0.1 - 1.0 K/uL   Eosinophils Relative 0 %   Eosinophils Absolute 0.0 0.0 - 0.5 K/uL   Basophils Relative 0 %   Basophils Absolute 0.0 0.0 - 0.1 K/uL    Immature Granulocytes 1 %   Abs Immature Granulocytes 0.08 (H) 0.00 - 0.07 K/uL  Basic metabolic panel     Status: Abnormal   Collection Time: 11/14/23  5:21 AM  Result Value Ref Range   Sodium 132 (L) 135 - 145 mmol/L   Potassium 5.2 (H) 3.5 - 5.1 mmol/L   Chloride 98 98 - 111 mmol/L   CO2 24 22 - 32 mmol/L   Glucose, Bld 433 (H) 70 - 99 mg/dL   BUN 87 (H) 6 - 20 mg/dL   Creatinine, Ser 0.27 (H) 0.44 - 1.00 mg/dL   Calcium  8.8 (L) 8.9 - 10.3 mg/dL   GFR, Estimated 20 (L) >60 mL/min   Anion gap 10 5 - 15  Beta-hydroxybutyric acid     Status: None   Collection Time: 11/14/23  5:21 AM  Result Value Ref Range   Beta-Hydroxybutyric Acid 0.05 0.05 - 0.27 mmol/L  Glucose, capillary     Status: Abnormal   Collection Time: 11/14/23  7:48 AM  Result Value Ref Range   Glucose-Capillary 328 (H) 70 - 99 mg/dL   *Note: Due to a large number of results and/or encounters for the requested time period, some results have not been displayed. A complete set of results can be found in Results Review.    ASSESSMENT: Dana [name] is a 63 y.o. female, 1 Day Post-Op s/p ground level fall Procedures: HINDFOOT FUSION FIXATION OFLEFT ANKLE  CV/Blood loss: Acute blood loss anemia, Hgb 7.8 this AM. Hemodynamically stable  PLAN: Weightbearing: WBAT LLE for transfers ROM:  Ok for knee motion as tolerated  Incisional and dressing care: Reinforce dressings as needed  Showering: Ok to begin getting incisions wet 11/16/23 Orthopedic device(s): CAM boot LLE Pain management:  1. Tylenol  325-650 mg q 6 hours scheduled 2. Robaxin  500 mg q 6 hours PRN 3. Norco 5-325 mg OR 7.5-325 mgmg q 4 hours PRN 4. Morphine  0.5-1 mg q 2 hours PRN 5. Neurontin  300 mg TID VTE prophylaxis:  Restart Brilinta  and ASA today , SCDs ID:  Ancef  2gm post op Foley/Lines:  No foley, KVO IVFs Impediments to Fracture Healing: Vit D level 22, started on supplementation Dispo: PT/OT eval today, dispo pending. Continue to work on glucose  control. Okay for discharge from ortho standpoint once cleared by medicine team and therapies  D/C recommendations: - Norco, Robaxin , Tylenol  for pain  control - Continue home dose Brilinta  and ASA for DVT prophylaxis - Continue 2000 units Vit D supplementation BID x 90 days  Follow - up plan: 2 weeks after d/c for wound check and repeat x-rays   Contact information:  Katheryne Pane MD, Alona Jamaica PA-C. After hours and holidays please check [email or web address] for group call information for [facility]   Edilia Gordon, PA-C [phone] (office) [email or web address]

## 2023-11-15 ENCOUNTER — Other Ambulatory Visit (HOSPITAL_COMMUNITY): Payer: Self-pay

## 2023-11-15 ENCOUNTER — Other Ambulatory Visit: Payer: Self-pay

## 2023-11-15 MED ORDER — PROMETHAZINE HCL 25 MG PO TABS
25.0000 mg | ORAL_TABLET | Freq: Every day | ORAL | 1 refills | Status: DC | PRN
  Filled 2023-11-15: qty 90, 90d supply, fill #0

## 2023-11-15 MED ORDER — LORAZEPAM 0.5 MG PO TABS
0.5000 mg | ORAL_TABLET | Freq: Every day | ORAL | 1 refills | Status: DC | PRN
  Filled 2023-11-15: qty 90, 90d supply, fill #0

## 2023-11-15 MED ORDER — FREESTYLE LIBRE 2 SENSOR MISC
2 refills | Status: DC
  Filled 2023-11-15: qty 2, 28d supply, fill #0
  Filled 2023-12-11: qty 2, 28d supply, fill #1
  Filled 2024-02-08: qty 2, 28d supply, fill #2

## 2023-11-15 MED ORDER — HYDROCODONE-ACETAMINOPHEN 7.5-325 MG PO TABS
1.0000 | ORAL_TABLET | Freq: Three times a day (TID) | ORAL | 0 refills | Status: DC | PRN
  Filled 2023-11-15: qty 90, 30d supply, fill #0

## 2023-11-18 ENCOUNTER — Encounter (HOSPITAL_COMMUNITY): Payer: Self-pay | Admitting: Student

## 2023-11-18 DIAGNOSIS — I480 Paroxysmal atrial fibrillation: Secondary | ICD-10-CM | POA: Diagnosis not present

## 2023-11-18 DIAGNOSIS — F411 Generalized anxiety disorder: Secondary | ICD-10-CM | POA: Diagnosis not present

## 2023-11-18 DIAGNOSIS — J449 Chronic obstructive pulmonary disease, unspecified: Secondary | ICD-10-CM | POA: Diagnosis not present

## 2023-11-18 DIAGNOSIS — M545 Low back pain, unspecified: Secondary | ICD-10-CM | POA: Diagnosis not present

## 2023-11-18 DIAGNOSIS — M069 Rheumatoid arthritis, unspecified: Secondary | ICD-10-CM | POA: Diagnosis not present

## 2023-11-18 DIAGNOSIS — D631 Anemia in chronic kidney disease: Secondary | ICD-10-CM | POA: Diagnosis not present

## 2023-11-18 DIAGNOSIS — G931 Anoxic brain damage, not elsewhere classified: Secondary | ICD-10-CM | POA: Diagnosis not present

## 2023-11-18 DIAGNOSIS — M1A09X0 Idiopathic chronic gout, multiple sites, without tophus (tophi): Secondary | ICD-10-CM | POA: Diagnosis not present

## 2023-11-18 DIAGNOSIS — I252 Old myocardial infarction: Secondary | ICD-10-CM | POA: Diagnosis not present

## 2023-11-18 DIAGNOSIS — J9611 Chronic respiratory failure with hypoxia: Secondary | ICD-10-CM | POA: Diagnosis not present

## 2023-11-18 DIAGNOSIS — R32 Unspecified urinary incontinence: Secondary | ICD-10-CM | POA: Diagnosis not present

## 2023-11-18 DIAGNOSIS — I25118 Atherosclerotic heart disease of native coronary artery with other forms of angina pectoris: Secondary | ICD-10-CM | POA: Diagnosis not present

## 2023-11-18 DIAGNOSIS — L89520 Pressure ulcer of left ankle, unstageable: Secondary | ICD-10-CM | POA: Diagnosis not present

## 2023-11-18 DIAGNOSIS — M80072D Age-related osteoporosis with current pathological fracture, left ankle and foot, subsequent encounter for fracture with routine healing: Secondary | ICD-10-CM | POA: Diagnosis not present

## 2023-11-18 DIAGNOSIS — E782 Mixed hyperlipidemia: Secondary | ICD-10-CM | POA: Diagnosis not present

## 2023-11-18 DIAGNOSIS — E038 Other specified hypothyroidism: Secondary | ICD-10-CM | POA: Diagnosis not present

## 2023-11-18 DIAGNOSIS — N184 Chronic kidney disease, stage 4 (severe): Secondary | ICD-10-CM | POA: Diagnosis not present

## 2023-11-18 DIAGNOSIS — F331 Major depressive disorder, recurrent, moderate: Secondary | ICD-10-CM | POA: Diagnosis not present

## 2023-11-18 DIAGNOSIS — G709 Myoneural disorder, unspecified: Secondary | ICD-10-CM | POA: Diagnosis not present

## 2023-11-18 DIAGNOSIS — E1142 Type 2 diabetes mellitus with diabetic polyneuropathy: Secondary | ICD-10-CM | POA: Diagnosis not present

## 2023-11-18 DIAGNOSIS — E1122 Type 2 diabetes mellitus with diabetic chronic kidney disease: Secondary | ICD-10-CM | POA: Diagnosis not present

## 2023-11-18 DIAGNOSIS — K219 Gastro-esophageal reflux disease without esophagitis: Secondary | ICD-10-CM | POA: Diagnosis not present

## 2023-11-18 DIAGNOSIS — I5042 Chronic combined systolic (congestive) and diastolic (congestive) heart failure: Secondary | ICD-10-CM | POA: Diagnosis not present

## 2023-11-18 DIAGNOSIS — I130 Hypertensive heart and chronic kidney disease with heart failure and stage 1 through stage 4 chronic kidney disease, or unspecified chronic kidney disease: Secondary | ICD-10-CM | POA: Diagnosis not present

## 2023-11-19 DIAGNOSIS — S82852D Displaced trimalleolar fracture of left lower leg, subsequent encounter for closed fracture with routine healing: Secondary | ICD-10-CM | POA: Diagnosis not present

## 2023-11-19 DIAGNOSIS — S82852A Displaced trimalleolar fracture of left lower leg, initial encounter for closed fracture: Secondary | ICD-10-CM | POA: Diagnosis not present

## 2023-11-19 DIAGNOSIS — F411 Generalized anxiety disorder: Secondary | ICD-10-CM | POA: Diagnosis not present

## 2023-11-20 ENCOUNTER — Other Ambulatory Visit (HOSPITAL_COMMUNITY): Payer: Self-pay

## 2023-11-21 ENCOUNTER — Other Ambulatory Visit: Payer: Self-pay

## 2023-11-25 ENCOUNTER — Telehealth: Payer: Self-pay

## 2023-11-25 DIAGNOSIS — F411 Generalized anxiety disorder: Secondary | ICD-10-CM | POA: Diagnosis not present

## 2023-11-25 NOTE — Telephone Encounter (Signed)
 Verbal given to Shelvy Dickens at [facility] well home health  Copied from CRM [id number]. Topic: Clinical - Home Health Verbal Orders >> Nov 25, 2023  8:46 AM Crispin Dolphin wrote: Caller/Agency: Shelvy Dickens Nurse [facility]  Callback Number: [phone] Service Requested: Skilled Nursing - address boot ankle from fall  - pressure ulcers - apply abd pad, curlex and ace wrap Frequency: 2 week 1, and 1 week 2 including yesterday and then every other week for 4 weeks Any new concerns about the patient? Yes - pressure ulcers

## 2023-12-02 DIAGNOSIS — S82852A Displaced trimalleolar fracture of left lower leg, initial encounter for closed fracture: Secondary | ICD-10-CM | POA: Diagnosis not present

## 2023-12-02 DIAGNOSIS — S82852D Displaced trimalleolar fracture of left lower leg, subsequent encounter for closed fracture with routine healing: Secondary | ICD-10-CM | POA: Diagnosis not present

## 2023-12-03 ENCOUNTER — Telehealth: Payer: Self-pay | Admitting: Family Medicine

## 2023-12-03 NOTE — Telephone Encounter (Signed)
 Mother was here and told us  about her injury.  I called to check on Dana [name].   Fracture ankle in 3 places. Surgery 3 weeks ago.  Check up today and was not healing well. Patient was given knee scooter. Physical therapy and occupational therapy have seen her and the scooter brakes are not working.   Not coping well. Increased depression. Does not feel like she needs her medicines adjusted. Patient is seeing Dr. Giarmo every other week.   Dr. Reinhold Carbine

## 2023-12-10 ENCOUNTER — Encounter

## 2023-12-10 DIAGNOSIS — F411 Generalized anxiety disorder: Secondary | ICD-10-CM | POA: Diagnosis not present

## 2023-12-11 ENCOUNTER — Other Ambulatory Visit: Payer: Self-pay | Admitting: Family Medicine

## 2023-12-11 ENCOUNTER — Other Ambulatory Visit (HOSPITAL_COMMUNITY): Payer: Self-pay

## 2023-12-11 ENCOUNTER — Other Ambulatory Visit: Payer: Self-pay | Admitting: Physician Assistant

## 2023-12-12 ENCOUNTER — Telehealth: Payer: Self-pay | Admitting: Family Medicine

## 2023-12-12 ENCOUNTER — Other Ambulatory Visit: Payer: Self-pay

## 2023-12-12 ENCOUNTER — Other Ambulatory Visit (HOSPITAL_COMMUNITY): Payer: Self-pay

## 2023-12-12 DIAGNOSIS — M549 Dorsalgia, unspecified: Secondary | ICD-10-CM | POA: Diagnosis not present

## 2023-12-12 DIAGNOSIS — R000 Tachycardia, unspecified: Secondary | ICD-10-CM | POA: Diagnosis not present

## 2023-12-12 DIAGNOSIS — I10 Essential (primary) hypertension: Secondary | ICD-10-CM | POA: Diagnosis not present

## 2023-12-12 DIAGNOSIS — R29810 Facial weakness: Secondary | ICD-10-CM | POA: Diagnosis not present

## 2023-12-12 MED ORDER — PANTOPRAZOLE SODIUM 40 MG PO TBEC
40.0000 mg | DELAYED_RELEASE_TABLET | Freq: Every day | ORAL | 1 refills | Status: DC
Start: 2023-12-12 — End: 2024-01-08
  Filled 2023-12-12: qty 90, 90d supply, fill #0

## 2023-12-12 MED ORDER — HYDROCODONE-ACETAMINOPHEN 7.5-325 MG PO TABS
1.0000 | ORAL_TABLET | Freq: Three times a day (TID) | ORAL | 0 refills | Status: DC | PRN
  Filled 2023-12-12: qty 90, 30d supply, fill #0

## 2023-12-12 NOTE — Telephone Encounter (Signed)
 [facility] [phone]

## 2023-12-13 ENCOUNTER — Encounter (HOSPITAL_COMMUNITY): Payer: Self-pay

## 2023-12-13 ENCOUNTER — Other Ambulatory Visit: Payer: Self-pay

## 2023-12-13 ENCOUNTER — Other Ambulatory Visit (HOSPITAL_COMMUNITY): Payer: Self-pay

## 2023-12-13 DIAGNOSIS — N3000 Acute cystitis without hematuria: Secondary | ICD-10-CM | POA: Diagnosis not present

## 2023-12-13 DIAGNOSIS — N12 Tubulo-interstitial nephritis, not specified as acute or chronic: Secondary | ICD-10-CM | POA: Diagnosis not present

## 2023-12-13 DIAGNOSIS — B9620 Unspecified Escherichia coli [E. coli] as the cause of diseases classified elsewhere: Secondary | ICD-10-CM | POA: Diagnosis not present

## 2023-12-13 DIAGNOSIS — Z452 Encounter for adjustment and management of vascular access device: Secondary | ICD-10-CM | POA: Diagnosis not present

## 2023-12-13 DIAGNOSIS — S8262XD Displaced fracture of lateral malleolus of left fibula, subsequent encounter for closed fracture with routine healing: Secondary | ICD-10-CM | POA: Diagnosis not present

## 2023-12-13 DIAGNOSIS — I2510 Atherosclerotic heart disease of native coronary artery without angina pectoris: Secondary | ICD-10-CM | POA: Diagnosis not present

## 2023-12-13 DIAGNOSIS — F418 Other specified anxiety disorders: Secondary | ICD-10-CM | POA: Diagnosis not present

## 2023-12-13 DIAGNOSIS — I25119 Atherosclerotic heart disease of native coronary artery with unspecified angina pectoris: Secondary | ICD-10-CM | POA: Diagnosis not present

## 2023-12-13 DIAGNOSIS — M1990 Unspecified osteoarthritis, unspecified site: Secondary | ICD-10-CM | POA: Diagnosis not present

## 2023-12-13 DIAGNOSIS — E1065 Type 1 diabetes mellitus with hyperglycemia: Secondary | ICD-10-CM | POA: Diagnosis not present

## 2023-12-13 DIAGNOSIS — I509 Heart failure, unspecified: Secondary | ICD-10-CM | POA: Diagnosis not present

## 2023-12-13 DIAGNOSIS — E7800 Pure hypercholesterolemia, unspecified: Secondary | ICD-10-CM | POA: Diagnosis not present

## 2023-12-13 DIAGNOSIS — A419 Sepsis, unspecified organism: Secondary | ICD-10-CM | POA: Diagnosis not present

## 2023-12-13 DIAGNOSIS — Z6836 Body mass index (BMI) 36.0-36.9, adult: Secondary | ICD-10-CM | POA: Diagnosis not present

## 2023-12-13 DIAGNOSIS — Z9981 Dependence on supplemental oxygen: Secondary | ICD-10-CM | POA: Diagnosis not present

## 2023-12-13 DIAGNOSIS — R4182 Altered mental status, unspecified: Secondary | ICD-10-CM | POA: Diagnosis not present

## 2023-12-13 DIAGNOSIS — R0602 Shortness of breath: Secondary | ICD-10-CM | POA: Diagnosis not present

## 2023-12-13 DIAGNOSIS — F419 Anxiety disorder, unspecified: Secondary | ICD-10-CM | POA: Diagnosis not present

## 2023-12-13 DIAGNOSIS — G4733 Obstructive sleep apnea (adult) (pediatric): Secondary | ICD-10-CM

## 2023-12-13 DIAGNOSIS — B9562 Methicillin resistant Staphylococcus aureus infection as the cause of diseases classified elsewhere: Secondary | ICD-10-CM | POA: Diagnosis not present

## 2023-12-13 DIAGNOSIS — N3090 Cystitis, unspecified without hematuria: Secondary | ICD-10-CM | POA: Diagnosis not present

## 2023-12-13 DIAGNOSIS — N390 Urinary tract infection, site not specified: Secondary | ICD-10-CM | POA: Diagnosis not present

## 2023-12-13 DIAGNOSIS — I130 Hypertensive heart and chronic kidney disease with heart failure and stage 1 through stage 4 chronic kidney disease, or unspecified chronic kidney disease: Secondary | ICD-10-CM | POA: Diagnosis not present

## 2023-12-13 DIAGNOSIS — E1022 Type 1 diabetes mellitus with diabetic chronic kidney disease: Secondary | ICD-10-CM

## 2023-12-13 DIAGNOSIS — N184 Chronic kidney disease, stage 4 (severe): Secondary | ICD-10-CM

## 2023-12-13 DIAGNOSIS — K219 Gastro-esophageal reflux disease without esophagitis: Secondary | ICD-10-CM | POA: Diagnosis not present

## 2023-12-13 DIAGNOSIS — T8149XA Infection following a procedure, other surgical site, initial encounter: Secondary | ICD-10-CM | POA: Diagnosis not present

## 2023-12-13 DIAGNOSIS — N10 Acute tubulo-interstitial nephritis: Secondary | ICD-10-CM | POA: Diagnosis not present

## 2023-12-13 DIAGNOSIS — I252 Old myocardial infarction: Secondary | ICD-10-CM | POA: Diagnosis not present

## 2023-12-13 DIAGNOSIS — G9341 Metabolic encephalopathy: Secondary | ICD-10-CM | POA: Diagnosis not present

## 2023-12-13 DIAGNOSIS — J449 Chronic obstructive pulmonary disease, unspecified: Secondary | ICD-10-CM | POA: Diagnosis not present

## 2023-12-13 DIAGNOSIS — I5042 Chronic combined systolic (congestive) and diastolic (congestive) heart failure: Secondary | ICD-10-CM

## 2023-12-13 DIAGNOSIS — J9611 Chronic respiratory failure with hypoxia: Secondary | ICD-10-CM | POA: Diagnosis not present

## 2023-12-13 DIAGNOSIS — B9689 Other specified bacterial agents as the cause of diseases classified elsewhere: Secondary | ICD-10-CM | POA: Diagnosis not present

## 2023-12-13 DIAGNOSIS — R7881 Bacteremia: Secondary | ICD-10-CM | POA: Diagnosis not present

## 2023-12-13 DIAGNOSIS — Z86711 Personal history of pulmonary embolism: Secondary | ICD-10-CM | POA: Diagnosis not present

## 2023-12-13 DIAGNOSIS — Z955 Presence of coronary angioplasty implant and graft: Secondary | ICD-10-CM | POA: Diagnosis not present

## 2023-12-13 DIAGNOSIS — D509 Iron deficiency anemia, unspecified: Secondary | ICD-10-CM | POA: Diagnosis not present

## 2023-12-13 DIAGNOSIS — F32A Depression, unspecified: Secondary | ICD-10-CM | POA: Diagnosis not present

## 2023-12-14 ENCOUNTER — Inpatient Hospital Stay (HOSPITAL_COMMUNITY)
Admission: EM | Admit: 2023-12-14 | Discharge: 2023-12-27 | DRG: 856 | Disposition: A | Discharge status: Rehabilitation Facility | Source: Other Acute Inpatient Hospital | Attending: Family Medicine | Admitting: Family Medicine

## 2023-12-14 ENCOUNTER — Encounter (HOSPITAL_COMMUNITY): Payer: Self-pay | Admitting: Family Medicine

## 2023-12-14 ENCOUNTER — Inpatient Hospital Stay (HOSPITAL_COMMUNITY)

## 2023-12-14 DIAGNOSIS — I2489 Other forms of acute ischemic heart disease: Secondary | ICD-10-CM | POA: Diagnosis present

## 2023-12-14 DIAGNOSIS — E1022 Type 1 diabetes mellitus with diabetic chronic kidney disease: Secondary | ICD-10-CM | POA: Diagnosis present

## 2023-12-14 DIAGNOSIS — L0390 Cellulitis, unspecified: Secondary | ICD-10-CM | POA: Diagnosis not present

## 2023-12-14 DIAGNOSIS — Y838 Other surgical procedures as the cause of abnormal reaction of the patient, or of later complication, without mention of misadventure at the time of the procedure: Secondary | ICD-10-CM | POA: Diagnosis present

## 2023-12-14 DIAGNOSIS — E1142 Type 2 diabetes mellitus with diabetic polyneuropathy: Secondary | ICD-10-CM | POA: Diagnosis not present

## 2023-12-14 DIAGNOSIS — M25511 Pain in right shoulder: Secondary | ICD-10-CM | POA: Diagnosis not present

## 2023-12-14 DIAGNOSIS — Z89512 Acquired absence of left leg below knee: Secondary | ICD-10-CM | POA: Diagnosis not present

## 2023-12-14 DIAGNOSIS — R21 Rash and other nonspecific skin eruption: Secondary | ICD-10-CM | POA: Diagnosis not present

## 2023-12-14 DIAGNOSIS — Z7982 Long term (current) use of aspirin: Secondary | ICD-10-CM

## 2023-12-14 DIAGNOSIS — N184 Chronic kidney disease, stage 4 (severe): Secondary | ICD-10-CM | POA: Diagnosis not present

## 2023-12-14 DIAGNOSIS — Z87891 Personal history of nicotine dependence: Secondary | ICD-10-CM

## 2023-12-14 DIAGNOSIS — E1169 Type 2 diabetes mellitus with other specified complication: Secondary | ICD-10-CM | POA: Diagnosis not present

## 2023-12-14 DIAGNOSIS — N1330 Unspecified hydronephrosis: Secondary | ICD-10-CM | POA: Diagnosis not present

## 2023-12-14 DIAGNOSIS — J449 Chronic obstructive pulmonary disease, unspecified: Secondary | ICD-10-CM | POA: Diagnosis present

## 2023-12-14 DIAGNOSIS — E1122 Type 2 diabetes mellitus with diabetic chronic kidney disease: Secondary | ICD-10-CM | POA: Diagnosis not present

## 2023-12-14 DIAGNOSIS — I2721 Secondary pulmonary arterial hypertension: Secondary | ICD-10-CM | POA: Diagnosis not present

## 2023-12-14 DIAGNOSIS — R339 Retention of urine, unspecified: Secondary | ICD-10-CM | POA: Diagnosis not present

## 2023-12-14 DIAGNOSIS — E782 Mixed hyperlipidemia: Secondary | ICD-10-CM | POA: Diagnosis present

## 2023-12-14 DIAGNOSIS — Z01810 Encounter for preprocedural cardiovascular examination: Secondary | ICD-10-CM | POA: Diagnosis not present

## 2023-12-14 DIAGNOSIS — Z6839 Body mass index (BMI) 39.0-39.9, adult: Secondary | ICD-10-CM | POA: Diagnosis not present

## 2023-12-14 DIAGNOSIS — A4102 Sepsis due to Methicillin resistant Staphylococcus aureus: Secondary | ICD-10-CM | POA: Diagnosis present

## 2023-12-14 DIAGNOSIS — I25119 Atherosclerotic heart disease of native coronary artery with unspecified angina pectoris: Secondary | ICD-10-CM | POA: Diagnosis present

## 2023-12-14 DIAGNOSIS — Z79899 Other long term (current) drug therapy: Secondary | ICD-10-CM

## 2023-12-14 DIAGNOSIS — Z91018 Allergy to other foods: Secondary | ICD-10-CM

## 2023-12-14 DIAGNOSIS — Z5986 Financial insecurity: Secondary | ICD-10-CM

## 2023-12-14 DIAGNOSIS — B9562 Methicillin resistant Staphylococcus aureus infection as the cause of diseases classified elsewhere: Secondary | ICD-10-CM | POA: Diagnosis present

## 2023-12-14 DIAGNOSIS — T8141XA Infection following a procedure, superficial incisional surgical site, initial encounter: Principal | ICD-10-CM | POA: Diagnosis present

## 2023-12-14 DIAGNOSIS — M069 Rheumatoid arthritis, unspecified: Secondary | ICD-10-CM | POA: Diagnosis not present

## 2023-12-14 DIAGNOSIS — A419 Sepsis, unspecified organism: Secondary | ICD-10-CM | POA: Diagnosis not present

## 2023-12-14 DIAGNOSIS — E10621 Type 1 diabetes mellitus with foot ulcer: Secondary | ICD-10-CM | POA: Diagnosis present

## 2023-12-14 DIAGNOSIS — Z8616 Personal history of COVID-19: Secondary | ICD-10-CM

## 2023-12-14 DIAGNOSIS — Z90710 Acquired absence of both cervix and uterus: Secondary | ICD-10-CM | POA: Diagnosis not present

## 2023-12-14 DIAGNOSIS — F331 Major depressive disorder, recurrent, moderate: Secondary | ICD-10-CM | POA: Diagnosis not present

## 2023-12-14 DIAGNOSIS — I5082 Biventricular heart failure: Secondary | ICD-10-CM | POA: Diagnosis not present

## 2023-12-14 DIAGNOSIS — D72829 Elevated white blood cell count, unspecified: Secondary | ICD-10-CM | POA: Diagnosis not present

## 2023-12-14 DIAGNOSIS — L03116 Cellulitis of left lower limb: Secondary | ICD-10-CM | POA: Diagnosis not present

## 2023-12-14 DIAGNOSIS — F419 Anxiety disorder, unspecified: Secondary | ICD-10-CM | POA: Diagnosis not present

## 2023-12-14 DIAGNOSIS — Z818 Family history of other mental and behavioral disorders: Secondary | ICD-10-CM

## 2023-12-14 DIAGNOSIS — S93402A Sprain of unspecified ligament of left ankle, initial encounter: Secondary | ICD-10-CM | POA: Diagnosis not present

## 2023-12-14 DIAGNOSIS — M869 Osteomyelitis, unspecified: Secondary | ICD-10-CM | POA: Diagnosis present

## 2023-12-14 DIAGNOSIS — Z794 Long term (current) use of insulin: Secondary | ICD-10-CM | POA: Diagnosis not present

## 2023-12-14 DIAGNOSIS — D631 Anemia in chronic kidney disease: Secondary | ICD-10-CM | POA: Diagnosis not present

## 2023-12-14 DIAGNOSIS — G8929 Other chronic pain: Secondary | ICD-10-CM | POA: Diagnosis present

## 2023-12-14 DIAGNOSIS — E038 Other specified hypothyroidism: Secondary | ICD-10-CM | POA: Diagnosis present

## 2023-12-14 DIAGNOSIS — Z981 Arthrodesis status: Secondary | ICD-10-CM

## 2023-12-14 DIAGNOSIS — I5042 Chronic combined systolic (congestive) and diastolic (congestive) heart failure: Secondary | ICD-10-CM | POA: Diagnosis not present

## 2023-12-14 DIAGNOSIS — I130 Hypertensive heart and chronic kidney disease with heart failure and stage 1 through stage 4 chronic kidney disease, or unspecified chronic kidney disease: Secondary | ICD-10-CM | POA: Diagnosis not present

## 2023-12-14 DIAGNOSIS — E1065 Type 1 diabetes mellitus with hyperglycemia: Secondary | ICD-10-CM | POA: Diagnosis present

## 2023-12-14 DIAGNOSIS — T8149XA Infection following a procedure, other surgical site, initial encounter: Principal | ICD-10-CM

## 2023-12-14 DIAGNOSIS — Z515 Encounter for palliative care: Secondary | ICD-10-CM | POA: Diagnosis not present

## 2023-12-14 DIAGNOSIS — E10610 Type 1 diabetes mellitus with diabetic neuropathic arthropathy: Secondary | ICD-10-CM | POA: Diagnosis present

## 2023-12-14 DIAGNOSIS — R6520 Severe sepsis without septic shock: Secondary | ICD-10-CM | POA: Diagnosis not present

## 2023-12-14 DIAGNOSIS — Z87440 Personal history of urinary (tract) infections: Secondary | ICD-10-CM

## 2023-12-14 DIAGNOSIS — Z8639 Personal history of other endocrine, nutritional and metabolic disease: Secondary | ICD-10-CM | POA: Diagnosis not present

## 2023-12-14 DIAGNOSIS — L97529 Non-pressure chronic ulcer of other part of left foot with unspecified severity: Secondary | ICD-10-CM | POA: Diagnosis present

## 2023-12-14 DIAGNOSIS — I5043 Acute on chronic combined systolic (congestive) and diastolic (congestive) heart failure: Secondary | ICD-10-CM | POA: Diagnosis present

## 2023-12-14 DIAGNOSIS — I50810 Right heart failure, unspecified: Secondary | ICD-10-CM | POA: Diagnosis not present

## 2023-12-14 DIAGNOSIS — B961 Klebsiella pneumoniae [K. pneumoniae] as the cause of diseases classified elsewhere: Secondary | ICD-10-CM | POA: Diagnosis not present

## 2023-12-14 DIAGNOSIS — I340 Nonrheumatic mitral (valve) insufficiency: Secondary | ICD-10-CM | POA: Diagnosis present

## 2023-12-14 DIAGNOSIS — R161 Splenomegaly, not elsewhere classified: Secondary | ICD-10-CM | POA: Diagnosis not present

## 2023-12-14 DIAGNOSIS — N189 Chronic kidney disease, unspecified: Secondary | ICD-10-CM | POA: Diagnosis present

## 2023-12-14 DIAGNOSIS — Z7189 Other specified counseling: Secondary | ICD-10-CM | POA: Diagnosis not present

## 2023-12-14 DIAGNOSIS — I5032 Chronic diastolic (congestive) heart failure: Secondary | ICD-10-CM | POA: Diagnosis not present

## 2023-12-14 DIAGNOSIS — N179 Acute kidney failure, unspecified: Secondary | ICD-10-CM | POA: Diagnosis not present

## 2023-12-14 DIAGNOSIS — R251 Tremor, unspecified: Secondary | ICD-10-CM | POA: Diagnosis not present

## 2023-12-14 DIAGNOSIS — Z7902 Long term (current) use of antithrombotics/antiplatelets: Secondary | ICD-10-CM

## 2023-12-14 DIAGNOSIS — I517 Cardiomegaly: Secondary | ICD-10-CM | POA: Diagnosis not present

## 2023-12-14 DIAGNOSIS — E11610 Type 2 diabetes mellitus with diabetic neuropathic arthropathy: Secondary | ICD-10-CM

## 2023-12-14 DIAGNOSIS — Z823 Family history of stroke: Secondary | ICD-10-CM

## 2023-12-14 DIAGNOSIS — K219 Gastro-esophageal reflux disease without esophagitis: Secondary | ICD-10-CM | POA: Diagnosis present

## 2023-12-14 DIAGNOSIS — F411 Generalized anxiety disorder: Secondary | ICD-10-CM | POA: Diagnosis not present

## 2023-12-14 DIAGNOSIS — S91002A Unspecified open wound, left ankle, initial encounter: Secondary | ICD-10-CM | POA: Diagnosis not present

## 2023-12-14 DIAGNOSIS — Z8249 Family history of ischemic heart disease and other diseases of the circulatory system: Secondary | ICD-10-CM

## 2023-12-14 DIAGNOSIS — Z9981 Dependence on supplemental oxygen: Secondary | ICD-10-CM

## 2023-12-14 DIAGNOSIS — D62 Acute posthemorrhagic anemia: Secondary | ICD-10-CM | POA: Diagnosis not present

## 2023-12-14 DIAGNOSIS — G4733 Obstructive sleep apnea (adult) (pediatric): Secondary | ICD-10-CM

## 2023-12-14 DIAGNOSIS — I70262 Atherosclerosis of native arteries of extremities with gangrene, left leg: Secondary | ICD-10-CM | POA: Diagnosis not present

## 2023-12-14 DIAGNOSIS — G253 Myoclonus: Secondary | ICD-10-CM | POA: Diagnosis not present

## 2023-12-14 DIAGNOSIS — N739 Female pelvic inflammatory disease, unspecified: Secondary | ICD-10-CM | POA: Diagnosis not present

## 2023-12-14 DIAGNOSIS — E43 Unspecified severe protein-calorie malnutrition: Secondary | ICD-10-CM | POA: Diagnosis not present

## 2023-12-14 DIAGNOSIS — I480 Paroxysmal atrial fibrillation: Secondary | ICD-10-CM | POA: Diagnosis not present

## 2023-12-14 DIAGNOSIS — E1042 Type 1 diabetes mellitus with diabetic polyneuropathy: Secondary | ICD-10-CM | POA: Diagnosis not present

## 2023-12-14 DIAGNOSIS — B9620 Unspecified Escherichia coli [E. coli] as the cause of diseases classified elsewhere: Secondary | ICD-10-CM | POA: Diagnosis not present

## 2023-12-14 DIAGNOSIS — T8744 Infection of amputation stump, left lower extremity: Secondary | ICD-10-CM | POA: Diagnosis not present

## 2023-12-14 DIAGNOSIS — E1051 Type 1 diabetes mellitus with diabetic peripheral angiopathy without gangrene: Secondary | ICD-10-CM | POA: Diagnosis present

## 2023-12-14 DIAGNOSIS — Z8673 Personal history of transient ischemic attack (TIA), and cerebral infarction without residual deficits: Secondary | ICD-10-CM

## 2023-12-14 DIAGNOSIS — N12 Tubulo-interstitial nephritis, not specified as acute or chronic: Secondary | ICD-10-CM | POA: Diagnosis not present

## 2023-12-14 DIAGNOSIS — E1069 Type 1 diabetes mellitus with other specified complication: Secondary | ICD-10-CM | POA: Diagnosis present

## 2023-12-14 DIAGNOSIS — J811 Chronic pulmonary edema: Secondary | ICD-10-CM | POA: Diagnosis not present

## 2023-12-14 DIAGNOSIS — I2510 Atherosclerotic heart disease of native coronary artery without angina pectoris: Secondary | ICD-10-CM | POA: Diagnosis present

## 2023-12-14 DIAGNOSIS — N10 Acute tubulo-interstitial nephritis: Secondary | ICD-10-CM | POA: Diagnosis present

## 2023-12-14 DIAGNOSIS — N390 Urinary tract infection, site not specified: Secondary | ICD-10-CM | POA: Diagnosis present

## 2023-12-14 DIAGNOSIS — R300 Dysuria: Secondary | ICD-10-CM | POA: Diagnosis present

## 2023-12-14 DIAGNOSIS — Z886 Allergy status to analgesic agent status: Secondary | ICD-10-CM

## 2023-12-14 DIAGNOSIS — I252 Old myocardial infarction: Secondary | ICD-10-CM

## 2023-12-14 DIAGNOSIS — M1009 Idiopathic gout, multiple sites: Secondary | ICD-10-CM | POA: Diagnosis present

## 2023-12-14 DIAGNOSIS — R93421 Abnormal radiologic findings on diagnostic imaging of right kidney: Secondary | ICD-10-CM | POA: Diagnosis not present

## 2023-12-14 DIAGNOSIS — M86172 Other acute osteomyelitis, left ankle and foot: Secondary | ICD-10-CM | POA: Diagnosis not present

## 2023-12-14 DIAGNOSIS — Z888 Allergy status to other drugs, medicaments and biological substances status: Secondary | ICD-10-CM

## 2023-12-14 DIAGNOSIS — R0989 Other specified symptoms and signs involving the circulatory and respiratory systems: Secondary | ICD-10-CM | POA: Diagnosis not present

## 2023-12-14 DIAGNOSIS — I4891 Unspecified atrial fibrillation: Secondary | ICD-10-CM | POA: Diagnosis not present

## 2023-12-14 DIAGNOSIS — Z83438 Family history of other disorder of lipoprotein metabolism and other lipidemia: Secondary | ICD-10-CM

## 2023-12-14 DIAGNOSIS — R7881 Bacteremia: Secondary | ICD-10-CM | POA: Diagnosis present

## 2023-12-14 DIAGNOSIS — T8140XA Infection following a procedure, unspecified, initial encounter: Secondary | ICD-10-CM | POA: Diagnosis not present

## 2023-12-14 DIAGNOSIS — Z952 Presence of prosthetic heart valve: Secondary | ICD-10-CM

## 2023-12-14 DIAGNOSIS — J9611 Chronic respiratory failure with hypoxia: Secondary | ICD-10-CM | POA: Diagnosis not present

## 2023-12-14 DIAGNOSIS — F418 Other specified anxiety disorders: Secondary | ICD-10-CM | POA: Diagnosis present

## 2023-12-14 DIAGNOSIS — G8918 Other acute postprocedural pain: Secondary | ICD-10-CM | POA: Diagnosis not present

## 2023-12-14 DIAGNOSIS — Z8674 Personal history of sudden cardiac arrest: Secondary | ICD-10-CM

## 2023-12-14 DIAGNOSIS — F32A Depression, unspecified: Secondary | ICD-10-CM | POA: Diagnosis present

## 2023-12-14 DIAGNOSIS — R609 Edema, unspecified: Secondary | ICD-10-CM | POA: Diagnosis not present

## 2023-12-14 DIAGNOSIS — S82842A Displaced bimalleolar fracture of left lower leg, initial encounter for closed fracture: Secondary | ICD-10-CM | POA: Diagnosis not present

## 2023-12-14 DIAGNOSIS — L97929 Non-pressure chronic ulcer of unspecified part of left lower leg with unspecified severity: Secondary | ICD-10-CM | POA: Diagnosis not present

## 2023-12-14 DIAGNOSIS — I700 Atherosclerosis of aorta: Secondary | ICD-10-CM | POA: Diagnosis present

## 2023-12-14 DIAGNOSIS — M5416 Radiculopathy, lumbar region: Secondary | ICD-10-CM | POA: Diagnosis present

## 2023-12-14 DIAGNOSIS — Z833 Family history of diabetes mellitus: Secondary | ICD-10-CM

## 2023-12-14 DIAGNOSIS — Z4781 Encounter for orthopedic aftercare following surgical amputation: Secondary | ICD-10-CM | POA: Diagnosis not present

## 2023-12-14 DIAGNOSIS — F5101 Primary insomnia: Secondary | ICD-10-CM | POA: Diagnosis present

## 2023-12-14 DIAGNOSIS — E66812 Obesity, class 2: Secondary | ICD-10-CM | POA: Diagnosis present

## 2023-12-14 DIAGNOSIS — M01X72 Direct infection of left ankle and foot in infectious and parasitic diseases classified elsewhere: Secondary | ICD-10-CM | POA: Diagnosis not present

## 2023-12-14 DIAGNOSIS — D649 Anemia, unspecified: Secondary | ICD-10-CM | POA: Diagnosis not present

## 2023-12-14 DIAGNOSIS — Z8701 Personal history of pneumonia (recurrent): Secondary | ICD-10-CM

## 2023-12-14 DIAGNOSIS — R159 Full incontinence of feces: Secondary | ICD-10-CM | POA: Diagnosis not present

## 2023-12-14 DIAGNOSIS — E559 Vitamin D deficiency, unspecified: Secondary | ICD-10-CM | POA: Diagnosis present

## 2023-12-14 DIAGNOSIS — Z91013 Allergy to seafood: Secondary | ICD-10-CM

## 2023-12-14 DIAGNOSIS — E871 Hypo-osmolality and hyponatremia: Secondary | ICD-10-CM | POA: Diagnosis not present

## 2023-12-14 DIAGNOSIS — N838 Other noninflammatory disorders of ovary, fallopian tube and broad ligament: Secondary | ICD-10-CM | POA: Diagnosis not present

## 2023-12-14 DIAGNOSIS — N3000 Acute cystitis without hematuria: Secondary | ICD-10-CM | POA: Diagnosis not present

## 2023-12-14 DIAGNOSIS — I25118 Atherosclerotic heart disease of native coronary artery with other forms of angina pectoris: Secondary | ICD-10-CM | POA: Diagnosis not present

## 2023-12-14 LAB — URINALYSIS, ROUTINE W REFLEX MICROSCOPIC
Bilirubin Urine: NEGATIVE
Glucose, UA: NEGATIVE mg/dL
Ketones, ur: NEGATIVE mg/dL
Nitrite: NEGATIVE
Protein, ur: 30 mg/dL — AB
Specific Gravity, Urine: 1.015 (ref 1.005–1.030)
WBC, UA: >50 WBC/hpf (ref 0–5)
pH: 5 (ref 5.0–8.0)

## 2023-12-14 LAB — HEPATIC FUNCTION PANEL
ALT: 11 U/L (ref 0–44)
AST: 11 U/L — ABNORMAL LOW (ref 15–41)
Albumin: 2.1 g/dL — ABNORMAL LOW (ref 3.5–5.0)
Alkaline Phosphatase: 144 U/L — ABNORMAL HIGH (ref 38–126)
Bilirubin, Direct: 0.2 mg/dL (ref 0.0–0.2)
Indirect Bilirubin: 0.4 mg/dL (ref 0.3–0.9)
Total Bilirubin: 0.6 mg/dL (ref 0.0–1.2)
Total Protein: 6.5 g/dL (ref 6.5–8.1)

## 2023-12-14 LAB — CBC
HCT: 23.5 % — ABNORMAL LOW (ref 36.0–46.0)
Hemoglobin: 7.2 g/dL — ABNORMAL LOW (ref 12.0–15.0)
MCH: 27.1 pg (ref 26.0–34.0)
MCHC: 30.6 g/dL (ref 30.0–36.0)
MCV: 88.3 fL (ref 80.0–100.0)
Platelets: 265 10*3/uL (ref 150–400)
RBC: 2.66 MIL/uL — ABNORMAL LOW (ref 3.87–5.11)
RDW: 16 % — ABNORMAL HIGH (ref 11.5–15.5)
WBC: 19.6 10*3/uL — ABNORMAL HIGH (ref 4.0–10.5)
nRBC: 0 % (ref 0.0–0.2)

## 2023-12-14 LAB — BASIC METABOLIC PANEL WITH GFR
Anion gap: 14 (ref 5–15)
BUN: 50 mg/dL — ABNORMAL HIGH (ref 6–20)
CO2: 22 mmol/L (ref 22–32)
Calcium: 8.4 mg/dL — ABNORMAL LOW (ref 8.9–10.3)
Chloride: 98 mmol/L (ref 98–111)
Creatinine, Ser: 2.25 mg/dL — ABNORMAL HIGH (ref 0.44–1.00)
GFR, Estimated: 24 mL/min — ABNORMAL LOW (ref >60)
Glucose, Bld: 137 mg/dL — ABNORMAL HIGH (ref 70–99)
Potassium: 4.3 mmol/L (ref 3.5–5.1)
Sodium: 134 mmol/L — ABNORMAL LOW (ref 135–145)

## 2023-12-14 LAB — PREPARE RBC (CROSSMATCH)

## 2023-12-14 LAB — GLUCOSE, CAPILLARY
Glucose-Capillary: 130 mg/dL — ABNORMAL HIGH (ref 70–99)
Glucose-Capillary: 143 mg/dL — ABNORMAL HIGH (ref 70–99)
Glucose-Capillary: 130 mg/dL — ABNORMAL HIGH (ref 70–99)
Glucose-Capillary: 132 mg/dL — ABNORMAL HIGH (ref 70–99)
Glucose-Capillary: 174 mg/dL — ABNORMAL HIGH (ref 70–99)

## 2023-12-14 LAB — LACTIC ACID, PLASMA: Lactic Acid, Venous: 1.2 mmol/L (ref 0.5–1.9)

## 2023-12-14 LAB — PROCALCITONIN: Procalcitonin: 6.9 ng/mL

## 2023-12-14 LAB — MAGNESIUM: Magnesium: 2.1 mg/dL (ref 1.7–2.4)

## 2023-12-14 MED ORDER — ASPIRIN 81 MG PO TBEC
81.0000 mg | DELAYED_RELEASE_TABLET | Freq: Every day | ORAL | Status: DC
  Administered 2023-12-14 – 2023-12-27 (×13): 81 mg via ORAL
  Filled 2023-12-14 (×13): qty 1

## 2023-12-14 MED ORDER — HYDROMORPHONE HCL 1 MG/ML IJ SOLN
0.5000 mg | INTRAMUSCULAR | Status: DC | PRN
  Administered 2023-12-14 (×2): 1 mg via INTRAVENOUS
  Filled 2023-12-14 (×2): qty 1

## 2023-12-14 MED ORDER — ISOSORBIDE MONONITRATE ER 30 MG PO TB24: 30.0000 mg | ORAL_TABLET | Freq: Every day | ORAL | Status: DC

## 2023-12-14 MED ORDER — INSULIN GLARGINE-YFGN 100 UNIT/ML ~~LOC~~ SOLN
10.0000 [IU] | Freq: Every day | SUBCUTANEOUS | Status: DC
  Administered 2023-12-14 – 2023-12-18 (×4): 10 [IU] via SUBCUTANEOUS
  Filled 2023-12-14 (×6): qty 0.1

## 2023-12-14 MED ORDER — LORAZEPAM 0.5 MG PO TABS
0.5000 mg | ORAL_TABLET | Freq: Every day | ORAL | Status: DC | PRN
  Administered 2023-12-14: 0.5 mg via ORAL
  Filled 2023-12-14: qty 1

## 2023-12-14 MED ORDER — DIPHENHYDRAMINE HCL 25 MG PO CAPS
25.0000 mg | ORAL_CAPSULE | Freq: Once | ORAL | Status: DC
  Filled 2023-12-14: qty 1

## 2023-12-14 MED ORDER — ACETAMINOPHEN 325 MG PO TABS
650.0000 mg | ORAL_TABLET | Freq: Once | ORAL | Status: AC
  Administered 2023-12-14: 650 mg via ORAL
  Filled 2023-12-14: qty 2

## 2023-12-14 MED ORDER — PANTOPRAZOLE SODIUM 40 MG PO TBEC
40.0000 mg | DELAYED_RELEASE_TABLET | Freq: Every day | ORAL | Status: DC
  Administered 2023-12-14 – 2023-12-27 (×14): 40 mg via ORAL
  Filled 2023-12-14 (×14): qty 1

## 2023-12-14 MED ORDER — VANCOMYCIN HCL IN DEXTROSE 1-5 GM/200ML-% IV SOLN
1000.0000 mg | INTRAVENOUS | Status: DC
  Administered 2023-12-14: 1000 mg via INTRAVENOUS
  Filled 2023-12-14: qty 200

## 2023-12-14 MED ORDER — SODIUM CHLORIDE 0.9 % IV SOLN
2.0000 g | Freq: Every day | INTRAVENOUS | Status: DC
  Administered 2023-12-14: 2 g via INTRAVENOUS
  Filled 2023-12-14: qty 20

## 2023-12-14 MED ORDER — ACETAMINOPHEN 650 MG RE SUPP: 650.0000 mg | Freq: Four times a day (QID) | RECTAL | Status: DC | PRN

## 2023-12-14 MED ORDER — INSULIN ASPART 100 UNIT/ML IJ SOLN
0.0000 [IU] | INTRAMUSCULAR | Status: DC
  Administered 2023-12-14: 2 [IU] via SUBCUTANEOUS
  Administered 2023-12-14 (×3): 1 [IU] via SUBCUTANEOUS
  Administered 2023-12-15: 2 [IU] via SUBCUTANEOUS
  Administered 2023-12-15: 1 [IU] via SUBCUTANEOUS
  Administered 2023-12-15 – 2023-12-16 (×4): 2 [IU] via SUBCUTANEOUS
  Administered 2023-12-16: 1 [IU] via SUBCUTANEOUS
  Administered 2023-12-16 (×2): 3 [IU] via SUBCUTANEOUS
  Administered 2023-12-17: 2 [IU] via SUBCUTANEOUS
  Administered 2023-12-17: 3 [IU] via SUBCUTANEOUS
  Administered 2023-12-17: 2 [IU] via SUBCUTANEOUS
  Administered 2023-12-17: 1 [IU] via SUBCUTANEOUS
  Administered 2023-12-17: 2 [IU] via SUBCUTANEOUS
  Administered 2023-12-18: 1 [IU] via SUBCUTANEOUS
  Administered 2023-12-18: 5 [IU] via SUBCUTANEOUS
  Administered 2023-12-18: 1 [IU] via SUBCUTANEOUS
  Administered 2023-12-18: 3 [IU] via SUBCUTANEOUS
  Administered 2023-12-19: 2 [IU] via SUBCUTANEOUS
  Administered 2023-12-19 (×2): 3 [IU] via SUBCUTANEOUS
  Administered 2023-12-19 (×2): 5 [IU] via SUBCUTANEOUS
  Administered 2023-12-19 – 2023-12-20 (×2): 2 [IU] via SUBCUTANEOUS

## 2023-12-14 MED ORDER — SODIUM CHLORIDE 0.9% IV SOLUTION: Freq: Once | INTRAVENOUS | Status: AC

## 2023-12-14 MED ORDER — PROCHLORPERAZINE EDISYLATE 10 MG/2ML IJ SOLN
5.0000 mg | Freq: Four times a day (QID) | INTRAMUSCULAR | Status: DC | PRN
  Administered 2023-12-14 – 2023-12-18 (×4): 5 mg via INTRAVENOUS
  Filled 2023-12-14 (×4): qty 2

## 2023-12-14 MED ORDER — HEPARIN SODIUM (PORCINE) 5000 UNIT/ML IJ SOLN
5000.0000 [IU] | Freq: Three times a day (TID) | INTRAMUSCULAR | Status: DC
  Administered 2023-12-14 – 2023-12-27 (×38): 5000 [IU] via SUBCUTANEOUS
  Filled 2023-12-14 (×38): qty 1

## 2023-12-14 MED ORDER — MIDODRINE HCL 5 MG PO TABS
5.0000 mg | ORAL_TABLET | Freq: Three times a day (TID) | ORAL | Status: DC
  Administered 2023-12-14 (×2): 5 mg via ORAL
  Filled 2023-12-14 (×2): qty 1

## 2023-12-14 MED ORDER — VANCOMYCIN HCL IN DEXTROSE 1-5 GM/200ML-% IV SOLN: 1000.0000 mg | Freq: Once | INTRAVENOUS | Status: DC

## 2023-12-14 MED ORDER — OXYCODONE HCL 5 MG PO TABS
5.0000 mg | ORAL_TABLET | ORAL | Status: DC | PRN
  Administered 2023-12-14 – 2023-12-21 (×15): 5 mg via ORAL
  Filled 2023-12-14 (×17): qty 1

## 2023-12-14 MED ORDER — LATANOPROST 0.005 % OP SOLN
1.0000 [drp] | Freq: Every day | OPHTHALMIC | Status: DC
  Administered 2023-12-14 – 2023-12-26 (×13): 1 [drp] via OPHTHALMIC
  Filled 2023-12-14 (×2): qty 2.5

## 2023-12-14 MED ORDER — LACTATED RINGERS IV SOLN: INTRAVENOUS | Status: AC

## 2023-12-14 MED ORDER — METHOCARBAMOL 500 MG PO TABS: 500.0000 mg | ORAL_TABLET | Freq: Four times a day (QID) | ORAL | Status: DC | PRN

## 2023-12-14 MED ORDER — GABAPENTIN 300 MG PO CAPS
300.0000 mg | ORAL_CAPSULE | Freq: Three times a day (TID) | ORAL | Status: DC
  Administered 2023-12-14 – 2023-12-27 (×38): 300 mg via ORAL
  Filled 2023-12-14 (×38): qty 1

## 2023-12-14 MED ORDER — ALLOPURINOL 100 MG PO TABS
50.0000 mg | ORAL_TABLET | Freq: Every day | ORAL | Status: DC
  Administered 2023-12-14 – 2023-12-27 (×14): 50 mg via ORAL
  Filled 2023-12-14 (×14): qty 1

## 2023-12-14 MED ORDER — SODIUM CHLORIDE 0.9% FLUSH
3.0000 mL | Freq: Two times a day (BID) | INTRAVENOUS | Status: DC
  Administered 2023-12-14 – 2023-12-27 (×21): 3 mL via INTRAVENOUS

## 2023-12-14 MED ORDER — VANCOMYCIN HCL 1.25 G IV SOLR
1250.0000 mg | INTRAVENOUS | Status: DC
  Administered 2023-12-16: 1250 mg via INTRAVENOUS
  Filled 2023-12-14 (×2): qty 25

## 2023-12-14 MED ORDER — CHLORHEXIDINE GLUCONATE CLOTH 2 % EX PADS
6.0000 | MEDICATED_PAD | Freq: Every day | CUTANEOUS | Status: DC
  Administered 2023-12-14 – 2023-12-27 (×11): 6 via TOPICAL

## 2023-12-14 MED ORDER — ACETAMINOPHEN 325 MG PO TABS
650.0000 mg | ORAL_TABLET | Freq: Four times a day (QID) | ORAL | Status: DC | PRN
  Administered 2023-12-16 – 2023-12-17 (×2): 650 mg via ORAL
  Filled 2023-12-14 (×3): qty 2

## 2023-12-14 MED ORDER — HYDROMORPHONE HCL 1 MG/ML IJ SOLN
0.5000 mg | INTRAMUSCULAR | Status: DC | PRN
  Administered 2023-12-14 – 2023-12-22 (×23): 0.5 mg via INTRAVENOUS
  Filled 2023-12-14 (×5): qty 0.5
  Filled 2023-12-14 (×3): qty 1
  Filled 2023-12-14: qty 0.5
  Filled 2023-12-14: qty 1
  Filled 2023-12-14 (×3): qty 0.5
  Filled 2023-12-14 (×2): qty 1
  Filled 2023-12-14 (×2): qty 0.5
  Filled 2023-12-14: qty 1
  Filled 2023-12-14 (×3): qty 0.5
  Filled 2023-12-14: qty 1
  Filled 2023-12-14: qty 0.5

## 2023-12-14 MED ORDER — SODIUM CHLORIDE 0.9% FLUSH
10.0000 mL | INTRAVENOUS | Status: DC | PRN
  Administered 2023-12-22: 10 mL

## 2023-12-14 MED ORDER — SENNA 8.6 MG PO TABS
1.0000 | ORAL_TABLET | Freq: Every day | ORAL | Status: DC | PRN
  Administered 2023-12-16: 8.6 mg via ORAL
  Filled 2023-12-14: qty 1

## 2023-12-14 MED ORDER — PIPERACILLIN-TAZOBACTAM 3.375 G IVPB
3.3750 g | Freq: Three times a day (TID) | INTRAVENOUS | Status: DC
  Administered 2023-12-14 – 2023-12-16 (×5): 3.375 g via INTRAVENOUS
  Filled 2023-12-14 (×5): qty 50

## 2023-12-14 MED ORDER — DOXEPIN HCL 75 MG PO CAPS: 150.0000 mg | ORAL_CAPSULE | Freq: Every day | ORAL | Status: DC

## 2023-12-14 NOTE — Progress Notes (Signed)
 PROGRESS NOTE    Dana [name]  EAV:[id number] DOB: 02/18/1961 DOA: 12/14/2023 PCP: Mercy Stall, MD    No chief complaint on file.   Brief Narrative:   Dana [name] is a pleasant 63 y.o. female with medical history significant for insulin -dependent diabetes mellitus, CKD stage IV, CAD, chronic hypoxic respiratory failure, OSA on CPAP, chronic HFmrEF, depression, anxiety, chronic pain, and left trimalleolar ankle fracture/dislocation status postoperative repair on 11/13/2023 who presented to the [facility] ED the night of 12/12/2023 with fevers, dysuria, and flank pain.   [facility] ED and Hospital Course: Upon arrival to the ED, patient is found to be febrile.  She had a leukocytosis 24,700.  CT of the abdomen and pelvis demonstrates bladder wall thickening with surrounding fat stranding.  Blood and urine cultures were collected, she was given IV fluids and broad-spectrum antibiotics, and was admitted to the hospitalist service.   Shortly after admission, it was noted that she had purulent drainage from left heel wound as well as open wounds at the medial aspect of the left ankle.  Orthopedic surgery at [facility] [place], [place]) was consulted and recommended medical admission to [facility] where they will consult.   Blood cultures at [facility] are growing MRSA.   Assessment & Plan:   Principal Problem:   Surgical site infection Active Problems:   Chronic combined systolic (congestive) and diastolic (congestive) heart failure (HCC)   Chronic respiratory failure with hypoxia (HCC)   OSA on CPAP   Uncontrolled type 1 diabetes mellitus with hyperglycemia, with long-term current use of insulin  (HCC)   CKD stage 4 due to type 1 diabetes mellitus (HCC)   Coronary artery disease involving native coronary artery of native heart with angina pectoris (HCC)   MRSA bacteremia   Depression with anxiety   UTI (urinary tract infection)  Severe sepsis, present on  admission MRSA bacteremia Left ankle post op infection Bilateral pyelonephritis - Severe sepsis present on admission when she is at [facility], persist during this hospital stay as well - 2 sets of cultures x 2 bottles growing gram-positive cocci, MRSA by PCR - recent left ankle fracture repair, site appears infected, draining purulent discharge, ortho consulted. - Will obtain 2D echo. - Continue with IV vancomycin  for MRSA bacteremia. - Continue with IV Rocephin  as urine culture growing E. coli pansensitive from [facility], low threshold to major kidneys if concern for hydronephrosis. - Will consult ID. - Continue with IV fluids. - Blood pressure is soft, will start on midodrine - Will need to discontinue her right IJ central line after pliable IV is established   CAD  Elevated troponins -She has had extensive CAD with prior PCI's and multiple evidence of in-stent restenosis in the proximal LAD with last intervention January 2025.   - scoring balloon angioplasty mid LAD 07/2023 -troponins were elevated at the hospital, 0.2, 0.18, 0.16, (the normal range is 0.04), this is secondary to sepsis, and CKD at baseline, no anginal symptoms. -She is on aspirin  and Brilinta , with recent balloon angioplasty, will resume on aspirin , hold Brilinta  in anticipation for surgery. -Given soft blood pressure will hold Coreg  and Imdur  for now.   Chronic HFmrEF  - EF was 40-45% in February 2025  - Appears compensated; diuretic held and IVF given in setting of sepsis  - Hold Coreg  until BP improves, monitor weight and I/Os     CKD IV  - Appears close to baseline  - Renally-dose medications, monitor  Insulin -dependent DM  - A1c was 10.2% in April 2025  - Check CBGs, continue long- and short-acting insulin     OSA; chronic hypoxic respiratory failure  - Continue supplemental O2, continue CPAP while sleeping    Depression, anxiety  - Continue doxepin  and low-dose Ativan  as-needed     Anemia of chronic kidney disease -Hemoglobin around baseline at 7.2, but given her cardiac history, anticipated surgery, will target at least hemoglobin of 8, will transfuse 1 unit PRBC.  Discussed with the patient, she is agreeable.       DVT prophylaxis: Heparin  Code Status: Full Family Communication: none at bedside Disposition:   Status is: Inpatient   The patient is critically ill with multi-organ failure.  Critical care was necessary to treat or prevent imminent or life-threatening deterioration of severe sepsis, chronic respiratory failure, chronic cardiac failure, chronic renal failure and was exclusive of separately billable procedures and treating other patients. Total critical care time spent by me: 40 mnutes Time spent personally by me on obtaining history from patient or surrogate, evaluation of the patient, evaluation of patient's response to treatment, ordering and review of laboratory studies, ordering and review of radiographic studies, ordering and performing treatments and interventions, and re-evaluation of the patient's condition.   Consultants:  Orthopedic ID will be consulted  Subjective:  Complains of generalized weakness, fatigue, pain in left ankle area  Objective: Vitals:   12/14/23 0400 12/14/23 0402 12/14/23 0747 12/14/23 0800  BP:  (!) 106/59 (!) 107/46 (!) 97/35  Pulse: 82  77 77  Resp: 13  16 14   Temp: 98.6 F (37 C)  98.3 F (36.8 C)   TempSrc:  Oral Oral   SpO2:   95% 94%  Weight:      Height:        Intake/Output Summary (Last 24 hours) at 12/14/2023 0949 Last data filed at 12/14/2023 0865 Gross per 24 hour  Intake --  Output 300 ml  Net -300 ml   Filed Weights   12/14/23 0100 12/14/23 0200  Weight: 103.3 kg 98 kg    Examination:  Awake Alert, Oriented X 3, frail, ill-appearing Right IJ central line present Symmetrical Chest wall movement, Good air movement bilaterally, CTAB RRR,No Gallops,Rubs or new Murmurs, No  Parasternal Heave +ve B.Sounds, Abd Soft, No tenderness, No rebound - guarding or rigidity. No Cyanosis, left foot bandaged    Data Reviewed: I have personally reviewed following labs and imaging studies  CBC: Recent Labs  Lab 12/14/23 0214  WBC 19.6*  HGB 7.2*  HCT 23.5*  MCV 88.3  PLT 265    Basic Metabolic Panel: Recent Labs  Lab 12/14/23 0214  NA 134*  K 4.3  CL 98  CO2 22  GLUCOSE 137*  BUN 50*  CREATININE 2.25*  CALCIUM  8.4*  MG 2.1    GFR: Estimated Creatinine Clearance: 30.8 mL/min (A) (by C-G formula based on SCr of 2.25 mg/dL (H)).  Liver Function Tests: Recent Labs  Lab 12/14/23 0214  AST 11*  ALT 11  ALKPHOS 144*  BILITOT 0.6  PROT 6.5  ALBUMIN  2.1*    CBG: Recent Labs  Lab 12/14/23 0408 12/14/23 0746  GLUCAP 130* 143*     Recent Results (from the past 240 hours)  Culture, blood (x 2)     Status: None (Preliminary result)   Collection Time: 12/14/23  2:14 AM   Specimen: BLOOD RIGHT ARM  Result Value Ref Range Status   Specimen Description BLOOD RIGHT ARM  Final  Special Requests   Final    BOTTLES DRAWN AEROBIC AND ANAEROBIC Blood Culture adequate volume   Culture   Final    NO GROWTH < 12 HOURS Performed at [facility], [address] [place], [place] [address]    Report Status PENDING  Incomplete  Culture, blood (x 2)     Status: None (Preliminary result)   Collection Time: 12/14/23  2:14 AM   Specimen: BLOOD RIGHT HAND  Result Value Ref Range Status   Specimen Description BLOOD RIGHT HAND  Final   Special Requests   Final    BOTTLES DRAWN AEROBIC AND ANAEROBIC Blood Culture adequate volume   Culture   Final    NO GROWTH < 12 HOURS Performed at [facility], [address], [place], [place] [address]    Report Status PENDING  Incomplete         Radiology Studies: DG Ankle Complete Left Result Date: 12/14/2023 CLINICAL DATA:  Surgical site infection. EXAM: LEFT ANKLE COMPLETE - 3 VIEW COMPARISON:   12/13/2023 FINDINGS: Postop changes with subtalar fusion. Bimalleolar fractures. Ankle mortise disruption. Regional soft tissue swelling. IMPRESSION: Postop changes. Disrupted ankle mortise. Displaced fractures. Soft tissue swelling. Electronically Signed   By: Sydell Eva M.D.   On: 12/14/2023 08:30   DG CHEST PORT 1 VIEW Result Date: 12/14/2023 CLINICAL DATA:  Surgical site infection.  CVC placement. EXAM: PORTABLE CHEST 1 VIEW COMPARISON:  12/13/2023. FINDINGS: Prominent cardiac silhouette. Pulmonary vascular congestion. No focal consolidation. No pneumothorax or pleural effusion. Right IJ CVC tip overlies distal SVC. IMPRESSION: Prominent cardiac silhouette and vascular congestion. Electronically Signed   By: Sydell Eva M.D.   On: 12/14/2023 08:28        Scheduled Meds:  sodium chloride    Intravenous Once   acetaminophen   650 mg Oral Once   allopurinol   50 mg Oral Daily   aspirin  EC  81 mg Oral Daily   Chlorhexidine  Gluconate Cloth  6 each Topical Daily   diphenhydrAMINE   25 mg Oral Once   gabapentin   300 mg Oral TID   insulin  aspart  0-9 Units Subcutaneous Q4H   insulin  glargine-yfgn  10 Units Subcutaneous Daily   isosorbide  mononitrate  30 mg Oral Daily   latanoprost   1 drop Both Eyes QHS   pantoprazole   40 mg Oral Daily   sodium chloride  flush  3 mL Intravenous Q12H   Continuous Infusions:  cefTRIAXone  (ROCEPHIN )  IV 2 g (12/14/23 0223)   lactated ringers  100 mL/hr at 12/14/23 0934   vancomycin  1,000 mg (12/14/23 0625)     LOS: 0 days       Seena Dadds, MD [facility]   To contact the attending provider between 7A-7P or the covering provider during after hours 7P-7A, please log into the web site [email or web address] and access using universal [place] password for that web site. If you do not have the password, please call the hospital operator.  12/14/2023, 9:49 AM

## 2023-12-14 NOTE — Plan of Care (Signed)
  Problem: Education: Goal: Knowledge of General Education information will improve Description: Including pain rating scale, medication(s)/side effects and non-pharmacologic comfort measures Outcome: Progressing   Problem: Health Behavior/Discharge Planning: Goal: Ability to manage health-related needs will improve Outcome: Progressing   Problem: Clinical Measurements: Goal: Will remain free from infection Outcome: Progressing Goal: Diagnostic test results will improve Outcome: Progressing   Problem: Activity: Goal: Risk for activity intolerance will decrease Outcome: Progressing   Problem: Coping: Goal: Level of anxiety will decrease Outcome: Progressing   Problem: Elimination: Goal: Will not experience complications related to urinary retention Outcome: Progressing   Problem: Pain Managment: Goal: General experience of comfort will improve and/or be controlled Outcome: Progressing

## 2023-12-14 NOTE — Progress Notes (Signed)
 CVC was removed per order with no complications.  Pt supine and suspended inspiration during removal with instructions to remain supine for 30 minutes after removal.  Pressure held to achieve hemostasis.  Vaseline/gauze/tegaderm applied.  Patient education provided regarding lifting restrictions, site care, and signs of infection.  Pt verbalized understanding and had no questions.

## 2023-12-14 NOTE — Consult Note (Signed)
 [facility]    Date of Admission:  12/14/2023     Reason for Consult: mrsa bsi, clinical pyelo    Referring Provider: Seena Dadds      Abx: Vanc ceftriaxone         Assessment: 63 yo female hx mitral valve repair, dm2, ckd4, cad, chronic hypoxic respiratory failure, osa on cpap, HFrEF, anxiety, chronic pain, recent orif 4/30 left ankle fracture, admitted to [facility] 5/29 for dysuria/flank pain presumed pyelo, also with mrsa bsi transferred [facility] 5/[facility] for further  management   Ct abd pelv from [place] "demonstrates bladder wall thickening/stranding" and with flank pain and ucx ecoli team presumed pyelo  Bcx at [place] grew mrsa and there was purulence/cellulitis from left  heel so transferred here for management of ankle infection. Agree source for mrsa could be here  Both vanc/ceftriaxone  started at [place]?  In reviewing hx here she denied any sx of uti/pyelo Ortho seeing and planned bka for left lower ext   5/31 bcx here ngtd 5/31 ucx here in progress   Plan: Continue vanc Dont' feel strongly about treating uti/pyelo but defer to primary team; 7 day would be sufficient Appreciate ortho evaluation F/u repeat bcx Await tte Given hx mitral valve surgery previously, I plan to do tee and will need to arrange this coming week Maintain contact isolation Will need to call [facility] tomorrow or Monday to get culture sensitivity Discussed with primary team     ------------------------------------------------ Principal Problem:   Surgical site infection Active Problems:   Chronic combined systolic (congestive) and diastolic (congestive) heart failure (HCC)   Chronic respiratory failure with hypoxia (HCC)   OSA on CPAP   Uncontrolled type 1 diabetes mellitus with hyperglycemia, with long-term current use of insulin  (HCC)   CKD stage 4 due to type 1 diabetes mellitus (HCC)   Coronary artery disease involving native coronary  artery of native heart with angina pectoris (HCC)   MRSA bacteremia   Depression with anxiety   UTI (urinary tract infection)    HPI: Dana [name] is a 63 y.o. female hx mitral valve repair, dm2, ckd4, cad, chronic hypoxic respiratory failure, osa on cpap, HFrEF, anxiety, chronic pain, recent orif 4/30 left ankle fracture, admitted to [facility] 5/29 for dysuria/flank pain presumed pyelo, also with mrsa bsi transferred to [facility] 5/31 for further  management  Hx corroborated with patient  She had bilateral lower neuropathy. No sx from her left leg after 4/15 surgery. She appears to have been weight bearing at home  [facility] nurse came a week prior to admission and noticed redness/purulence.   Patient became confused around the day of admission and presented with sepsis Mrsa bsi  Somehow abd ct done and suggest pyelo; ucx ecoli Placed on vanc/ceftriaxone   Transferred to [place] for ortho management of the hardware associated left ankle infection  Repeat bcx and ucx here ngtd but only obtained today  Reports prior mv repair; denies dyspnea, chest pain, cough, or other focal pain in the joint/back No headache/visual change     Family History  Problem Relation Age of Onset   Breast cancer Mother 45       contralateral breast ca dx 34   Coronary artery disease Mother    Hypertension Mother    Hyperlipidemia Mother    Diabetes type II Mother    Lung cancer Mother 40   Diabetes Father    Hypertension Father    Mental  illness Sister    Bipolar disorder Other    Depression Other    Schizophrenia Other    Cancer Other        MGF's sisters x2; unknown type    Social History   Tobacco Use   Smoking status: Former    Current packs/day: 0.00    Types: Cigarettes    Start date: 58    Quit date: 1984    Years since quitting: 41.4    Passive exposure: Past   Smokeless tobacco: Never  Vaping Use   Vaping status: Never Used  Substance Use Topics   Alcohol use: Never   Drug  use: Never    Allergies  Allergen Reactions   Naproxen Hives and Rash   Shellfish-Derived Products Hives, Swelling and Other (See Comments)    Angioedema    Strawberry (Diagnostic) Hives   Celecoxib Other (See Comments)    Stomach pain   Doxepin  Hcl Other (See Comments)    Lethargic, sleeping a lot per pt and family   Glucosamine Hives, Swelling and Other (See Comments)    Angioedema    Iron  Hives and Other (See Comments)    IV iron  transfusion - hives - Feb 2022 hospitalization  Patient said she CAN tolerate if given Benadryl  first   Metoclopramide Other (See Comments)    Facial twitching and stuttering   Metolazone  Other (See Comments)    Acute renal failure   Statins Other (See Comments)    Muscle pain    Review of Systems: ROS All Other ROS was negative, except mentioned above   Past Medical History:  Diagnosis Date   Acquired absence of left great toe (HCC)    Acquired absence of other left toe(s) (HCC)    ACS (acute coronary syndrome) (HCC)    Acute bronchitis due to COVID-19 virus 03/03/2023   Acute on chronic combined systolic and diastolic CHF (congestive heart failure) (HCC) 09/16/2023   Acute respiratory failure with hypoxia (HCC) 07/20/2023   Anemia    Anoxic brain injury (HCC)    Anxiety    CAD (coronary artery disease)    DES mLAD 04/07/15; DES dRCA & DES pPDA 08/30/16; DES mCX & PTCA OM1 09/29/20   Chronic diastolic (congestive) heart failure (HCC)    Chronic pain    Chronic systolic (congestive) heart failure (HCC)    CKD (chronic kidney disease)    Contrast dye induced nephropathy, possible 09/03/2020   COPD (chronic obstructive pulmonary disease) (HCC)    COVID-19 virus infection 03/03/2023   Demand ischemia (HCC)    Diabetes (HCC)type 1    Diastolic CHF (HCC)    Dyspnea    Encounter for screening mammogram for malignant neoplasm of breast 09/16/2023   Family history of breast cancer 10/23/2021   Gastro-esophageal reflux disease without  esophagitis    Hyperlipidemia    Hypertension    Hypertensive heart disease with heart failure (HCC)    Hypoxemia    Idiopathic gout, multiple sites    Leukocytosis 03/29/2022   Localized edema    Low back pain    Mitral regurgitation    Mitral regurgitation    Mixed hyperlipidemia    Mixed incontinence    Morbid (severe) obesity due to excess calories (HCC)    Neuromuscular disorder (HCC)    Neuropathy    Non-ST elevation (NSTEMI) myocardial infarction (HCC)    08/29/20, 09/29/20   OSA (obstructive sleep apnea) 09/03/2020   Other specified hypothyroidism    PAF (paroxysmal atrial fibrillation) (  HCC)    s/p pulmonary vein isolation by cryoablation 10/04/15 Dr. Jearldine Mina in [facility]   PEA (Pulseless electrical activity) [facility])    ~ 01/13/21 at [facility] during admission DKA, volume overload, possible CAP, hypoxia s/p ACLS with ROSC ~ 10-15   Pneumonia    Primary insomnia    Rheumatoid arthritis (HCC)    Stroke (HCC)    Thyroid  disease    Type 1 diabetes mellitus (HCC)    URI with cough and congestion 08/22/2023   Vitamin D  deficiency        Scheduled Meds:  sodium chloride    Intravenous Once   acetaminophen   650 mg Oral Once   allopurinol   50 mg Oral Daily   aspirin  EC  81 mg Oral Daily   Chlorhexidine  Gluconate Cloth  6 each Topical Daily   diphenhydrAMINE   25 mg Oral Once   gabapentin   300 mg Oral TID   heparin  injection (subcutaneous)  5,000 Units Subcutaneous Q8H   insulin  aspart  0-9 Units Subcutaneous Q4H   insulin  glargine-yfgn  10 Units Subcutaneous Daily   latanoprost   1 drop Both Eyes QHS   midodrine  5 mg Oral TID WC   pantoprazole   40 mg Oral Daily   sodium chloride  flush  3 mL Intravenous Q12H   Continuous Infusions:  cefTRIAXone  (ROCEPHIN )  IV 2 g (12/14/23 0223)   lactated ringers  100 mL/hr at 12/14/23 0934   [START ON 12/16/2023] vancomycin      PRN Meds:.acetaminophen  **OR** acetaminophen , HYDROmorphone  (DILAUDID ) injection, LORazepam , oxyCODONE ,  prochlorperazine , senna, sodium chloride  flush   OBJECTIVE: Blood pressure (!) 97/35, pulse 77, temperature 98.3 F (36.8 C), temperature source Oral, resp. rate 14, height 5\' 5"  (1.651 m), weight 98 kg, SpO2 94%.  Physical Exam General/constitutional: no distress, pleasant HEENT: Normocephalic, PER, Conj Clear, EOMI, Oropharynx clear Neck supple CV: rrr; soft systolic murmur best heard left upper sternal border Lungs: clear to auscultation, normal respiratory effort Abd: Soft, Nontender Ext: no edema Skin: No Rash Neuro: nonfocal MSK: left ankle dressing c/d; cellulitis change beyond dressing extending to just below mid calf left side Gu: no cva tenderness    Lab Results Lab Results  Component Value Date   WBC 19.6 (H) 12/14/2023   HGB 7.2 (L) 12/14/2023   HCT 23.5 (L) 12/14/2023   MCV 88.3 12/14/2023   PLT 265 12/14/2023    Lab Results  Component Value Date   CREATININE 2.25 (H) 12/14/2023   BUN 50 (H) 12/14/2023   NA 134 (L) 12/14/2023   K 4.3 12/14/2023   CL 98 12/14/2023   CO2 22 12/14/2023    Lab Results  Component Value Date   ALT 11 12/14/2023   AST 11 (L) 12/14/2023   ALKPHOS 144 (H) 12/14/2023   BILITOT 0.6 12/14/2023      Microbiology: Recent Results (from the past 240 hours)  Culture, blood (x 2)     Status: None (Preliminary result)   Collection Time: 12/14/23  2:14 AM   Specimen: BLOOD RIGHT ARM  Result Value Ref Range Status   Specimen Description BLOOD RIGHT ARM  Final   Special Requests   Final    BOTTLES DRAWN AEROBIC AND ANAEROBIC Blood Culture adequate volume   Culture   Final    NO GROWTH < 12 HOURS Performed at [facility], [address] [place], [place] [address]    Report Status PENDING  Incomplete  Culture, blood (x 2)     Status: None (Preliminary result)  Collection Time: 12/14/23  2:14 AM   Specimen: BLOOD RIGHT HAND  Result Value Ref Range Status   Specimen Description BLOOD RIGHT HAND  Final   Special Requests    Final    BOTTLES DRAWN AEROBIC AND ANAEROBIC Blood Culture adequate volume   Culture   Final    NO GROWTH < 12 HOURS Performed at [facility], [address] [place], [place] [address]    Report Status PENDING  Incomplete     Serology:    Imaging: If present, new imagings (plain films, ct scans, and mri) have been personally visualized and interpreted; radiology reports have been reviewed. Decision making incorporated into the Impression / Recommendations.  5/31 cxr Prominent cardiac silhouette and vascular congestion.   5/31 left ankle xray FINDINGS: Postop changes with subtalar fusion. Bimalleolar fractures. Ankle mortise disruption. Regional soft tissue swelling.   IMPRESSION: Postop changes. Disrupted ankle mortise. Displaced fractures. Soft tissue swelling. (Presence of hardwares)    Jamesetta Mcbride, MD [facility] [phone] pager    12/14/2023, 10:10 AM

## 2023-12-14 NOTE — Progress Notes (Signed)
 No urinary output this shift.  Bladder scan showed .  MD notified.  Will benefit to have a full bladder for scheduled US .  Will notify MD when test completed for further direction

## 2023-12-14 NOTE — Progress Notes (Signed)
 Consult to assess R internal jugular TL CVC. Placed 5-30 at [facility]. No CXR for tip present in record. Primary RN notified to contact MD for CXR to confirm CVC tip, so that it can be used.

## 2023-12-14 NOTE — Progress Notes (Signed)
 Pharmacy Antibiotic Note  Dana [name] is a 63 y.o. female transferred from [facility] on 12/14/2023 with UTI, L heel wound infection and MRSA bacteremia.  Pharmacy has been consulted for Vancomycin  dosing. Previously received Zyvox and Cefepime while at outside hospital.  SCr 1.8 on 5/30.   Plan: Vancomycin  1,250 mg IV q48h (eAUC 525, Vd 0.5, Scr 2.25)  Patient's Scr up, wil consider a vancomycin  random level for tomorrow   F/u ID recommendations, cultures, and renal function  Continue ceftriaxone  2 g IV q24h   Height: 5\' 5"  (165.1 cm) Weight: 98 kg (216 lb 0.8 oz) (at [facility]) IBW/kg (Calculated) : 57  Temp (24hrs), Avg:98.1 F (36.7 C), Min:97.8 F (36.6 C), Max:98.6 F (37 C)  Recent Labs  Lab 12/14/23 0214  WBC 19.6*  CREATININE 2.25*  LATICACIDVEN 1.2    Estimated Creatinine Clearance: 30.8 mL/min (A) (by C-G formula based on SCr of 2.25 mg/dL (H)).    Allergies  Allergen Reactions   Naproxen Hives and Rash   Shellfish-Derived Products Hives, Swelling and Other (See Comments)    Angioedema    Strawberry (Diagnostic) Hives   Celecoxib Other (See Comments)    Stomach pain   Doxepin  Hcl Other (See Comments)    Lethargic, sleeping a lot per pt and family   Glucosamine Hives, Swelling and Other (See Comments)    Angioedema    Iron  Hives and Other (See Comments)    IV iron  transfusion - hives - Feb 2022 hospitalization  Patient said she CAN tolerate if given Benadryl  first   Metoclopramide Other (See Comments)    Facial twitching and stuttering   Metolazone  Other (See Comments)    Acute renal failure   Statins Other (See Comments)    Muscle pain    Antimicrobials this admission: CTX 5/31 >>  Vanc 5/31 >> Zyvox and Cefepime while at outside hospital  Microbiology results: 5/31 Bcx: NG  5/31 Ucx:   Thank you for allowing pharmacy to be a part of this patient's care.  Adaline Ada, PharmD PGY1 Pharmacy Resident 12/14/2023 10:18 AM

## 2023-12-14 NOTE — Progress Notes (Signed)
 Patient with infected ankle, status post open reduction internal fixation.  Plan for full consultation later today, no surgery planned until Monday with Dr. Curtiss Dowdy.  Okay to eat and drink per primary team.  Full consult to follow, okay to anticoagulate with short acting agents until time of surgery.  Osa Blase, MD

## 2023-12-14 NOTE — Consult Note (Signed)
 ORTHOPAEDIC CONSULTATION  REQUESTING PHYSICIAN: Elgergawy, Ardia Kraft, MD  Chief Complaint: Left ankle infection  HPI: Dana [name] is a 63 y.o. female who complains of drainage and pain from the left ankle.  She had an open reduction internal fixation after an ankle fracture dislocation on April 30.  She has noted some malaise, fevers, dysuria, was admitted to [facility], and then transferred here for definitive management.  She has chronic loss of sensation in that foot at baseline and has had previous amputation of her toes.  She has a past medical history significant for insulin -dependent diabetes mellitus, CKD stage IV, CAD, chronic hypoxic respiratory failure, OSA on CPAP, chronic HFmrEF, depression, anxiety, chronic pain, and left trimalleolar ankle fracture/dislocation status postoperative repair on 11/13/2023 who presented to the [facility] ED the night of 12/12/2023 with fevers, dysuria, and flank pain.   Blood cultures at [facility] demonstrated MRSA. She also had pyelonephritis found at [facility].   Past Medical History:  Diagnosis Date   Acquired absence of left great toe (HCC)    Acquired absence of other left toe(s) (HCC)    ACS (acute coronary syndrome) (HCC)    Acute bronchitis due to COVID-19 virus 03/03/2023   Acute on chronic combined systolic and diastolic CHF (congestive heart failure) (HCC) 09/16/2023   Acute respiratory failure with hypoxia (HCC) 07/20/2023   Anemia    Anoxic brain injury (HCC)    Anxiety    CAD (coronary artery disease)    DES mLAD 04/07/15; DES dRCA & DES pPDA 08/30/16; DES mCX & PTCA OM1 09/29/20   Chronic diastolic (congestive) heart failure (HCC)    Chronic pain    Chronic systolic (congestive) heart failure (HCC)    CKD (chronic kidney disease)    Contrast dye induced nephropathy, possible 09/03/2020   COPD (chronic obstructive pulmonary disease) (HCC)    COVID-19 virus infection 03/03/2023   Demand ischemia (HCC)    Diabetes (HCC)type 1     Diastolic CHF (HCC)    Dyspnea    Encounter for screening mammogram for malignant neoplasm of breast 09/16/2023   Family history of breast cancer 10/23/2021   Gastro-esophageal reflux disease without esophagitis    Hyperlipidemia    Hypertension    Hypertensive heart disease with heart failure (HCC)    Hypoxemia    Idiopathic gout, multiple sites    Leukocytosis 03/29/2022   Localized edema    Low back pain    Mitral regurgitation    Mitral regurgitation    Mixed hyperlipidemia    Mixed incontinence    Morbid (severe) obesity due to excess calories (HCC)    Neuromuscular disorder (HCC)    Neuropathy    Non-ST elevation (NSTEMI) myocardial infarction (HCC)    08/29/20, 09/29/20   OSA (obstructive sleep apnea) 09/03/2020   Other specified hypothyroidism    PAF (paroxysmal atrial fibrillation) (HCC)    s/p pulmonary vein isolation by cryoablation 10/04/15 Dr. Jearldine Mina in [facility]   PEA (Pulseless electrical activity) [facility])    ~ 01/13/21 at [facility] during admission DKA, volume overload, possible CAP, hypoxia s/p ACLS with ROSC ~ 10-15   Pneumonia    Primary insomnia    Rheumatoid arthritis (HCC)    Stroke (HCC)    Thyroid  disease    Type 1 diabetes mellitus (HCC)    URI with cough and congestion 08/22/2023   Vitamin D  deficiency    Past Surgical History:  Procedure Laterality Date   ABDOMINAL HYSTERECTOMY     Partial- Still  has both ovaries   ANKLE FUSION Left 11/13/2023   Procedure: HINDFOOT FUSION FIXATION OF ANKLE;  Surgeon: Laneta Pintos, MD;  Location: [facility] OR;  Service: Orthopedics;  Laterality: Left;   CARDIOVASCULAR STRESS TEST  08/13/2014   Nuclear; Normal   CARPAL TUNNEL RELEASE     CATARACT EXTRACTION, BILATERAL     CESAREAN SECTION     CHOLECYSTECTOMY     CORONARY ANGIOPLASTY WITH STENT PLACEMENT     3 blockages/ 1 stent   CORONARY BALLOON ANGIOPLASTY N/A 07/18/2023   Procedure: CORONARY BALLOON ANGIOPLASTY;  Surgeon: Odie Benne, MD;   Location: [facility] INVASIVE CV LAB;  Service: Cardiovascular;  Laterality: N/A;   CORONARY PRESSURE/FFR STUDY N/A 09/07/2022   Procedure: INTRAVASCULAR PRESSURE WIRE/FFR STUDY;  Surgeon: Swaziland, Peter M, MD;  Location: [facility] INVASIVE CV LAB;  Service: Cardiovascular;  Laterality: N/A;   CORONARY STENT INTERVENTION N/A 09/29/2020   Procedure: CORONARY STENT INTERVENTION;  Surgeon: Arnoldo Lapping, MD;  Location: [facility] INVASIVE CV LAB;  Service: Cardiovascular;  Laterality: N/A;  CFX   CORONARY STENT INTERVENTION N/A 09/07/2022   Procedure: CORONARY STENT INTERVENTION;  Surgeon: Swaziland, Peter M, MD;  Location: [facility] INVASIVE CV LAB;  Service: Cardiovascular;  Laterality: N/A;   CORONARY STENT INTERVENTION N/A 02/13/2023   Procedure: CORONARY STENT INTERVENTION;  Surgeon: Odie Benne, MD;  Location: [facility] INVASIVE CV LAB;  Service: Cardiovascular;  Laterality: N/A;  LAD   CORONARY ULTRASOUND/IVUS N/A 02/13/2023   Procedure: Coronary Ultrasound/IVUS;  Surgeon: Odie Benne, MD;  Location: [facility] INVASIVE CV LAB;  Service: Cardiovascular;  Laterality: N/A;   CORONARY/GRAFT ACUTE MI REVASCULARIZATION N/A 12/08/2022   Procedure: Coronary/Graft Acute MI Revascularization;  Surgeon: Arleen Lacer, MD;  Location: [facility] INVASIVE CV LAB;  Service: Cardiovascular;  Laterality: N/A;   EYE SURGERY     LEFT HEART CATH AND CORONARY ANGIOGRAPHY N/A 09/29/2020   Procedure: LEFT HEART CATH AND CORONARY ANGIOGRAPHY;  Surgeon: Arnoldo Lapping, MD;  Location: [facility] INVASIVE CV LAB;  Service: Cardiovascular;  Laterality: N/A;   LEFT HEART CATH AND CORONARY ANGIOGRAPHY N/A 09/07/2022   Procedure: LEFT HEART CATH AND CORONARY ANGIOGRAPHY;  Surgeon: Swaziland, Peter M, MD;  Location: [facility] INVASIVE CV LAB;  Service: Cardiovascular;  Laterality: N/A;   LEFT HEART CATH AND CORONARY ANGIOGRAPHY N/A 12/08/2022   Procedure: LEFT HEART CATH AND CORONARY ANGIOGRAPHY;  Surgeon: Arleen Lacer, MD;  Location: [facility] INVASIVE CV LAB;  Service:  Cardiovascular;  Laterality: N/A;   LEFT HEART CATH AND CORONARY ANGIOGRAPHY N/A 02/13/2023   Procedure: LEFT HEART CATH AND CORONARY ANGIOGRAPHY;  Surgeon: Odie Benne, MD;  Location: [facility] INVASIVE CV LAB;  Service: Cardiovascular;  Laterality: N/A;   LEFT HEART CATH AND CORONARY ANGIOGRAPHY N/A 07/16/2023   Procedure: LEFT HEART CATH AND CORONARY ANGIOGRAPHY;  Surgeon: Odie Benne, MD;  Location: [facility] INVASIVE CV LAB;  Service: Cardiovascular;  Laterality: N/A;   RIGHT HEART CATH N/A 04/27/2021   Procedure: RIGHT HEART CATH;  Surgeon: Mardell Shade, MD;  Location: [facility] INVASIVE CV LAB;  Service: Cardiovascular;  Laterality: N/A;   RIGHT/LEFT HEART CATH AND CORONARY ANGIOGRAPHY N/A 01/07/2017   Procedure: Right/Left Heart Cath and Coronary Angiography;  Surgeon: Darlis Eisenmenger, MD;  Location: [facility] INVASIVE CV LAB;  Service: Cardiovascular;  Laterality: N/A;   ROTATOR CUFF REPAIR     TEE WITHOUT CARDIOVERSION N/A 04/28/2021   Procedure: TRANSESOPHAGEAL ECHOCARDIOGRAM (TEE);  Surgeon: Mardell Shade, MD;  Location: [facility] ENDOSCOPY;  Service: Cardiovascular;  Laterality:  N/A;   TEE WITHOUT CARDIOVERSION N/A 05/18/2021   Procedure: TRANSESOPHAGEAL ECHOCARDIOGRAM (TEE);  Surgeon: Thukkani, Arun K, MD;  Location: [facility] INVASIVE CV LAB;  Service: Cardiovascular;  Laterality: N/A;   TOE AMPUTATION Left    TRANSCATHETER MITRAL EDGE TO EDGE REPAIR N/A 05/18/2021   Procedure: MITRAL VALVE REPAIR;  Surgeon: Kyra Phy, MD;  Location: [facility] INVASIVE CV LAB;  Service: Cardiovascular;  Laterality: N/A;   US  ECHOCARDIOGRAPHY  05/2017   Normal   Social History   Socioeconomic History   Marital status: Married    Spouse name: [name]   Number of children: 2   Years of education: 16   Highest education level: Master's degree (e.g., MA, MS, MEng, MEd, MSW, MBA)  Occupational History   Occupation: on disability/retired early former Engineer, site  Tobacco Use   Smoking status: Former     Current packs/day: 0.00    Types: Cigarettes    Start date: 59    Quit date: 1984    Years since quitting: 41.4    Passive exposure: Past   Smokeless tobacco: Never  Vaping Use   Vaping status: Never Used  Substance and Sexual Activity   Alcohol use: Never   Drug use: Never   Sexual activity: Not on file  Other Topics Concern   Not on file  Social History Narrative   Not on file   Social Drivers of Health   Financial Resource Strain: Low Risk  (10/24/2023)   Overall Financial Resource Strain (CARDIA)    Difficulty of Paying Living Expenses: Not hard at all  Food Insecurity: No Food Insecurity (12/14/2023)   Hunger Vital Sign    Worried About Running Out of Food in the Last Year: Never true    Ran Out of Food in the Last Year: Never true  Transportation Needs: No Transportation Needs (12/14/2023)   PRAPARE - Administrator, Civil Service (Medical): No    Lack of Transportation (Non-Medical): No  Physical Activity: Insufficiently Active (10/24/2023)   Exercise Vital Sign    Days of Exercise per Week: 3 days    Minutes of Exercise per Session: 30 min  Stress: Stress Concern Present (10/24/2023)   [employer] of Occupational Health - Occupational Stress Questionnaire    Feeling of Stress : To some extent  Social Connections: Moderately Integrated (12/14/2023)   Social Connection and Isolation Panel [NHANES]    Frequency of Communication with Friends and Family: More than three times a week    Frequency of Social Gatherings with Friends and Family: Three times a week    Attends Religious Services: More than 4 times per year    Active Member of Clubs or Organizations: No    Attends Banker Meetings: Never    Marital Status: Married   Family History  Problem Relation Age of Onset   Breast cancer Mother 55       contralateral breast ca dx 94   Coronary artery disease Mother    Hypertension Mother    Hyperlipidemia Mother    Diabetes type II  Mother    Lung cancer Mother 20   Diabetes Father    Hypertension Father    Mental illness Sister    Bipolar disorder Other    Depression Other    Schizophrenia Other    Cancer Other        MGF's sisters x2; unknown type   Allergies  Allergen Reactions   Naproxen Hives and Rash   Shellfish-Derived  Products Hives, Swelling and Other (See Comments)    Angioedema    Strawberry (Diagnostic) Hives   Celecoxib Other (See Comments)    Stomach pain   Doxepin  Hcl Other (See Comments)    Lethargic, sleeping a lot per pt and family   Glucosamine Hives, Swelling and Other (See Comments)    Angioedema    Iron  Hives and Other (See Comments)    IV iron  transfusion - hives - Feb 2022 hospitalization  Patient said she CAN tolerate if given Benadryl  first   Metoclopramide Other (See Comments)    Facial twitching and stuttering   Metolazone  Other (See Comments)    Acute renal failure   Statins Other (See Comments)    Muscle pain     Positive ROS: All other systems have been reviewed and were otherwise negative with the exception of those mentioned in the HPI and as above.  Physical Exam:  BP 106/66   Pulse 77   Temp 97.6 F (36.4 C)   Resp 18   Ht 5\' 5"  (1.651 m)   Wt 98 kg Comment: at [facility]  SpO2 98%   BMI 35.95 kg/m   General: Alert, no acute distress Cardiovascular: Minimal pedal edema Respiratory: No cyanosis, no use of accessory musculature GI: No organomegaly, abdomen is soft and non-tender Skin: She has purulence draining from her lateral calcaneus.  See pictures below.  Her lateral fibular incision looks clean.  Mild cellulitis extending up the leg around the level of the distal tibia. Neurologic: Sensation absent throughout the foot. Psychiatric: Patient is competent for consent with normal mood and affect Lymphatic: No axillary or cervical lymphadenopathy  MUSCULOSKELETAL: Left ankle has slight deformity, she has loss of the toes, she has a medial  eschar.           Assessment: Principal Problem:   Surgical site infection Active Problems:   Chronic combined systolic (congestive) and diastolic (congestive) heart failure (HCC)   Chronic respiratory failure with hypoxia (HCC)   OSA on CPAP   Uncontrolled type 1 diabetes mellitus with hyperglycemia, with long-term current use of insulin  (HCC)   CKD stage 4 due to type 1 diabetes mellitus (HCC)   Coronary artery disease involving native coronary artery of native heart with angina pectoris (HCC)   MRSA bacteremia   Depression with anxiety   UTI (urinary tract infection)   Osteomyelitis with MRSA bacteremia and purulent drainage from her surgical site infection with recurrent ankle fracture dislocation around a talocalcaneal tibial nail.  Plan: This is a severe and limb and life-threatening situation.  I have recommended below-knee amputation.  I have spoken with Dr. Curtiss Dowdy, Dr. Julio Ohm, and also the family and the patient at length.  The risks benefits and alternatives were discussed with the patient including but not limited to the risks of nonoperative treatment, versus surgical intervention including infection, bleeding, nerve injury, malunion, nonunion, the need for revision surgery, blood clots, cardiopulmonary complications, morbidity, mortality, among others, and they were willing to proceed.    Given the bacteremia we will plan for surgery as soon as she is optimized, n.p.o. after midnight tonight, surgery planned for tomorrow at 1:00 PM.  Prediction of surgical timing is variable particularly on the weekends.   Neville Barbone, MD Cell [phone]   12/14/2023 11:46 AM

## 2023-12-14 NOTE — Progress Notes (Signed)
 Pharmacy Antibiotic Note  Dana [name] is a 63 y.o. female transferred from [facility] on 12/14/2023 with UTI, L heel wound infection and MRSA bacteremia.  Pharmacy has been consulted for Vancomycin  dosing.    Previously received Zyvox and Cefepime while at outside hospital.  SCr 1.8 on 5/30  Plan: Vancomycin  1000 mg IV q24h F/U final blood cultures and urine culture from OSH   Weight: 98 kg (216 lb 0.8 oz) (at [facility])  Temp (24hrs), Avg:97.8 F (36.6 C), Min:97.8 F (36.6 C), Max:97.8 F (36.6 C)  No results for input(s): "WBC", "CREATININE", "LATICACIDVEN", "VANCOTROUGH", "VANCOPEAK", "VANCORANDOM", "GENTTROUGH", "GENTPEAK", "GENTRANDOM", "TOBRATROUGH", "TOBRAPEAK", "TOBRARND", "AMIKACINPEAK", "AMIKACINTROU", "AMIKACIN" in the last 168 hours.  CrCl cannot be calculated (Patient's most recent lab result is older than the maximum 21 days allowed.).    Allergies  Allergen Reactions   Naproxen Hives and Rash   Shellfish-Derived Products Hives, Swelling and Other (See Comments)    Angioedema    Strawberry (Diagnostic) Hives   Celecoxib Other (See Comments)    Stomach pain   Glucosamine Hives, Swelling and Other (See Comments)    Angioedema    Iron  Hives and Other (See Comments)    IV iron  transfusion - hives - Feb 2022 hospitalization  Patient said she CAN tolerate if given Benadryl  first   Metoclopramide Other (See Comments)    Facial twitching and stuttering   Metolazone  Other (See Comments)    Acute renal failure   Statins Other (See Comments)    Muscle pain     Dana [name] 12/14/2023 2:07 AM

## 2023-12-14 NOTE — Plan of Care (Signed)
  Problem: Education: Goal: Knowledge of General Education information will improve Description: Including pain rating scale, medication(s)/side effects and non-pharmacologic comfort measures Outcome: Progressing   Problem: Clinical Measurements: Goal: Will remain free from infection Outcome: Progressing   Problem: Activity: Goal: Risk for activity intolerance will decrease Outcome: Progressing   Problem: Nutrition: Goal: Adequate nutrition will be maintained Outcome: Progressing   Problem: Elimination: Goal: Will not experience complications related to bowel motility Outcome: Progressing   Problem: Pain Managment: Goal: General experience of comfort will improve and/or be controlled Outcome: Progressing   Problem: Skin Integrity: Goal: Risk for impaired skin integrity will decrease Outcome: Progressing

## 2023-12-14 NOTE — Plan of Care (Signed)
  Problem: Education: Goal: Knowledge of General Education information will improve Description: Including pain rating scale, medication(s)/side effects and non-pharmacologic comfort measures Outcome: Progressing   Problem: Health Behavior/Discharge Planning: Goal: Ability to manage health-related needs will improve Outcome: Progressing   Problem: Clinical Measurements: Goal: Will remain free from infection Outcome: Progressing Goal: Diagnostic test results will improve Outcome: Progressing Goal: Cardiovascular complication will be avoided Outcome: Progressing   Problem: Activity: Goal: Risk for activity intolerance will decrease Outcome: Progressing   Problem: Coping: Goal: Level of anxiety will decrease Outcome: Progressing

## 2023-12-14 NOTE — Progress Notes (Signed)
 Secure chat with Dr Osborne Blazer and staff re CXR results for CVC.  Noted to be looped.  Dr Osborne Blazer planning on DC CVC.

## 2023-12-14 NOTE — H&P (Signed)
 History and Physical    Dana [name] ZOX:[id number] DOB: [birth year] DOA: 12/14/2023  PCP: Mercy Stall, MD   Patient coming from: Home   Chief Complaint: Dysuria, flank pain, fever   HPI: Dana [name] is a pleasant 63 y.o. female with medical history significant for insulin -dependent diabetes mellitus, CKD stage IV, CAD, chronic hypoxic respiratory failure, OSA on CPAP, chronic HFmrEF, depression, anxiety, chronic pain, and left trimalleolar ankle fracture/dislocation status postoperative repair on 11/13/2023 who presented to the [facility] ED the night of 12/12/2023 with fevers, dysuria, and flank pain.  [facility] ED and Hospital Course: Upon arrival to the ED, patient is found to be febrile.  She had a leukocytosis 24,700.  CT of the abdomen and pelvis demonstrates bladder wall thickening with surrounding fat stranding.  Blood and urine cultures were collected, she was given IV fluids and broad-spectrum antibiotics, and was admitted to the hospitalist service.  Shortly after admission, it was noted that she had purulent drainage from left heel wound as well as open wounds at the medial aspect of the left ankle.  Orthopedic surgery at [facility] [place], [place]) was consulted and recommended medical admission to [facility] where they will consult.  Blood cultures at [facility] are growing MRSA.  Review of Systems:  All other systems reviewed and apart from HPI, are negative.  Past Medical History:  Diagnosis Date   Acquired absence of left great toe (HCC)    Acquired absence of other left toe(s) (HCC)    ACS (acute coronary syndrome) (HCC)    Acute bronchitis due to COVID-19 virus 03/03/2023   Acute on chronic combined systolic and diastolic CHF (congestive heart failure) (HCC) 09/16/2023   Acute respiratory failure with hypoxia (HCC) 07/20/2023   Anemia    Anoxic brain injury (HCC)    Anxiety    CAD (coronary artery disease)    DES mLAD 04/07/15; DES dRCA & DES  pPDA 08/30/16; DES mCX & PTCA OM1 09/29/20   Chronic diastolic (congestive) heart failure (HCC)    Chronic pain    Chronic systolic (congestive) heart failure (HCC)    CKD (chronic kidney disease)    Contrast dye induced nephropathy, possible 09/03/2020   COPD (chronic obstructive pulmonary disease) (HCC)    COVID-19 virus infection 03/03/2023   Demand ischemia (HCC)    Diabetes (HCC)type 1    Diastolic CHF (HCC)    Dyspnea    Encounter for screening mammogram for malignant neoplasm of breast 09/16/2023   Family history of breast cancer 10/23/2021   Gastro-esophageal reflux disease without esophagitis    Hyperlipidemia    Hypertension    Hypertensive heart disease with heart failure (HCC)    Hypoxemia    Idiopathic gout, multiple sites    Leukocytosis 03/29/2022   Localized edema    Low back pain    Mitral regurgitation    Mitral regurgitation    Mixed hyperlipidemia    Mixed incontinence    Morbid (severe) obesity due to excess calories (HCC)    Neuromuscular disorder (HCC)    Neuropathy    Non-ST elevation (NSTEMI) myocardial infarction (HCC)    08/29/20, 09/29/20   OSA (obstructive sleep apnea) 09/03/2020   Other specified hypothyroidism    PAF (paroxysmal atrial fibrillation) (HCC)    s/p pulmonary vein isolation by cryoablation 10/04/15 Dr. Jearldine Mina in [facility]   PEA (Pulseless electrical activity) [facility])    ~ 01/13/21 at [facility] during admission DKA, volume overload, possible CAP, hypoxia s/p  ACLS with ROSC ~ 10-15   Pneumonia    Primary insomnia    Rheumatoid arthritis (HCC)    Stroke (HCC)    Thyroid  disease    Type 1 diabetes mellitus (HCC)    URI with cough and congestion 08/22/2023   Vitamin D  deficiency     Past Surgical History:  Procedure Laterality Date   ABDOMINAL HYSTERECTOMY     Partial- Still has both ovaries   ANKLE FUSION Left 11/13/2023   Procedure: HINDFOOT FUSION FIXATION OF ANKLE;  Surgeon: Laneta Pintos, MD;  Location: [facility] OR;  Service:  Orthopedics;  Laterality: Left;   CARDIOVASCULAR STRESS TEST  08/13/2014   Nuclear; Normal   CARPAL TUNNEL RELEASE     CATARACT EXTRACTION, BILATERAL     CESAREAN SECTION     CHOLECYSTECTOMY     CORONARY ANGIOPLASTY WITH STENT PLACEMENT     3 blockages/ 1 stent   CORONARY BALLOON ANGIOPLASTY N/A 07/18/2023   Procedure: CORONARY BALLOON ANGIOPLASTY;  Surgeon: Odie Benne, MD;  Location: [facility] INVASIVE CV LAB;  Service: Cardiovascular;  Laterality: N/A;   CORONARY PRESSURE/FFR STUDY N/A 09/07/2022   Procedure: INTRAVASCULAR PRESSURE WIRE/FFR STUDY;  Surgeon: Swaziland, Peter M, MD;  Location: [facility] INVASIVE CV LAB;  Service: Cardiovascular;  Laterality: N/A;   CORONARY STENT INTERVENTION N/A 09/29/2020   Procedure: CORONARY STENT INTERVENTION;  Surgeon: Arnoldo Lapping, MD;  Location: [facility] INVASIVE CV LAB;  Service: Cardiovascular;  Laterality: N/A;  CFX   CORONARY STENT INTERVENTION N/A 09/07/2022   Procedure: CORONARY STENT INTERVENTION;  Surgeon: Swaziland, Peter M, MD;  Location: [facility] INVASIVE CV LAB;  Service: Cardiovascular;  Laterality: N/A;   CORONARY STENT INTERVENTION N/A 02/13/2023   Procedure: CORONARY STENT INTERVENTION;  Surgeon: Odie Benne, MD;  Location: [facility] INVASIVE CV LAB;  Service: Cardiovascular;  Laterality: N/A;  LAD   CORONARY ULTRASOUND/IVUS N/A 02/13/2023   Procedure: Coronary Ultrasound/IVUS;  Surgeon: Odie Benne, MD;  Location: [facility] INVASIVE CV LAB;  Service: Cardiovascular;  Laterality: N/A;   CORONARY/GRAFT ACUTE MI REVASCULARIZATION N/A 12/08/2022   Procedure: Coronary/Graft Acute MI Revascularization;  Surgeon: Arleen Lacer, MD;  Location: [facility] INVASIVE CV LAB;  Service: Cardiovascular;  Laterality: N/A;   EYE SURGERY     LEFT HEART CATH AND CORONARY ANGIOGRAPHY N/A 09/29/2020   Procedure: LEFT HEART CATH AND CORONARY ANGIOGRAPHY;  Surgeon: Arnoldo Lapping, MD;  Location: [facility] INVASIVE CV LAB;  Service: Cardiovascular;  Laterality: N/A;   LEFT  HEART CATH AND CORONARY ANGIOGRAPHY N/A 09/07/2022   Procedure: LEFT HEART CATH AND CORONARY ANGIOGRAPHY;  Surgeon: Swaziland, Peter M, MD;  Location: [facility] INVASIVE CV LAB;  Service: Cardiovascular;  Laterality: N/A;   LEFT HEART CATH AND CORONARY ANGIOGRAPHY N/A 12/08/2022   Procedure: LEFT HEART CATH AND CORONARY ANGIOGRAPHY;  Surgeon: Arleen Lacer, MD;  Location: [facility] INVASIVE CV LAB;  Service: Cardiovascular;  Laterality: N/A;   LEFT HEART CATH AND CORONARY ANGIOGRAPHY N/A 02/13/2023   Procedure: LEFT HEART CATH AND CORONARY ANGIOGRAPHY;  Surgeon: Odie Benne, MD;  Location: [facility] INVASIVE CV LAB;  Service: Cardiovascular;  Laterality: N/A;   LEFT HEART CATH AND CORONARY ANGIOGRAPHY N/A 07/16/2023   Procedure: LEFT HEART CATH AND CORONARY ANGIOGRAPHY;  Surgeon: Odie Benne, MD;  Location: [facility] INVASIVE CV LAB;  Service: Cardiovascular;  Laterality: N/A;   RIGHT HEART CATH N/A 04/27/2021   Procedure: RIGHT HEART CATH;  Surgeon: Mardell Shade, MD;  Location: [facility] INVASIVE CV LAB;  Service: Cardiovascular;  Laterality: N/A;  RIGHT/LEFT HEART CATH AND CORONARY ANGIOGRAPHY N/A 01/07/2017   Procedure: Right/Left Heart Cath and Coronary Angiography;  Surgeon: Darlis Eisenmenger, MD;  Location: [facility] INVASIVE CV LAB;  Service: Cardiovascular;  Laterality: N/A;   ROTATOR CUFF REPAIR     TEE WITHOUT CARDIOVERSION N/A 04/28/2021   Procedure: TRANSESOPHAGEAL ECHOCARDIOGRAM (TEE);  Surgeon: Mardell Shade, MD;  Location: [facility] ENDOSCOPY;  Service: Cardiovascular;  Laterality: N/A;   TEE WITHOUT CARDIOVERSION N/A 05/18/2021   Procedure: TRANSESOPHAGEAL ECHOCARDIOGRAM (TEE);  Surgeon: Thukkani, Arun K, MD;  Location: [facility] INVASIVE CV LAB;  Service: Cardiovascular;  Laterality: N/A;   TOE AMPUTATION Left    TRANSCATHETER MITRAL EDGE TO EDGE REPAIR N/A 05/18/2021   Procedure: MITRAL VALVE REPAIR;  Surgeon: Kyra Phy, MD;  Location: [facility] INVASIVE CV LAB;  Service: Cardiovascular;   Laterality: N/A;   US  ECHOCARDIOGRAPHY  05/2017   Normal    Social History:   reports that she quit smoking about 41 years ago. Her smoking use included cigarettes. She started smoking about 42 years ago. She has been exposed to tobacco smoke. She has never used smokeless tobacco. She reports that she does not drink alcohol and does not use drugs.  Allergies  Allergen Reactions   Naproxen Hives and Rash   Shellfish-Derived Products Hives, Swelling and Other (See Comments)    Angioedema    Strawberry (Diagnostic) Hives   Celecoxib Other (See Comments)    Stomach pain   Glucosamine Hives, Swelling and Other (See Comments)    Angioedema    Iron  Hives and Other (See Comments)    IV iron  transfusion - hives - Feb 2022 hospitalization  Patient said she CAN tolerate if given Benadryl  first   Metoclopramide Other (See Comments)    Facial twitching and stuttering   Metolazone  Other (See Comments)    Acute renal failure   Statins Other (See Comments)    Muscle pain    Family History  Problem Relation Age of Onset   Breast cancer Mother 51       contralateral breast ca dx 89   Coronary artery disease Mother    Hypertension Mother    Hyperlipidemia Mother    Diabetes type II Mother    Lung cancer Mother 30   Diabetes Father    Hypertension Father    Mental illness Sister    Bipolar disorder Other    Depression Other    Schizophrenia Other    Cancer Other        MGF's sisters x2; unknown type     Prior to Admission medications   Medication Sig Start Date End Date Taking? Authorizing Provider  acetaminophen  (TYLENOL ) 500 MG tablet Take 1,000 mg by mouth every 6 (six) hours as needed for mild pain (pain score 1-3) or headache.    [provider]  allopurinol  (ZYLOPRIM ) 100 MG tablet Take 0.5 tablets (50 mg total) by mouth daily. 04/29/23   Mercy Stall, MD  aspirin  EC 81 MG tablet Take 1 tablet (81 mg total) by mouth daily. Swallow whole. 08/13/23   CoxBurleigh Carp, MD   carvedilol  (COREG ) 6.25 MG tablet Take 1.5 tablets (9.375 mg total) by mouth 2 (two) times daily. 08/05/23   Sheryl Donna, NP  vitamin D3 (CHOLECALCIFEROL ) 25 MCG tablet Take 2 tablets (2,000 Units total) by mouth 2 (two) times daily. 11/14/23 02/12/24  Versie Gores, PA-C  Continuous Glucose Sensor (FREESTYLE LIBRE 2 SENSOR) MISC Inject 1 device into skin every 14 (fourteen) days. 11/15/23  Janece Means, FNP  doxepin  (SINEQUAN ) 150 MG capsule Take 1 capsule (150 mg total) by mouth at bedtime. 11/01/23   Odilia Bennett, PA  EPINEPHrine  0.3 mg/0.3 mL IJ SOAJ injection Use as directed for life-threatening allergic reaction. 04/29/23   Cox, Burleigh Carp, MD  Evolocumab  (REPATHA  SURECLICK) 140 MG/ML SOAJ Inject 140 mg into the skin every 14 (fourteen) days. 04/29/23   CoxBurleigh Carp, MD  ezetimibe  (ZETIA ) 10 MG tablet Take 1 tablet (10 mg total) by mouth every evening. 04/29/23   CoxBurleigh Carp, MD  gabapentin  (NEURONTIN ) 300 MG capsule Take 1 capsule (300 mg total) by mouth 3 (three) times daily. 11/01/23   Odilia Bennett, PA  Glucagon  (GVOKE HYPOPEN  2-PACK) 0.5 MG/0.1ML SOAJ Inject 0.5 mg into the skin daily as needed. Patient taking differently: Inject 0.5 mg into the skin daily as needed (hypoglycemia). 07/12/23   CoxBurleigh Carp, MD  HYDROcodone -acetaminophen  (NORCO) 7.5-325 MG tablet Take 1 tablet by mouth 3 (three) times daily as needed. 12/12/23   Mercy Stall, MD  insulin  aspart (NOVOLOG  FLEXPEN) 100 UNIT/ML FlexPen Inject 2 - 10 Units before meals based on SSI. 04/29/23   Cox, Kirsten, MD  insulin  glargine (LANTUS  SOLOSTAR) 100 UNIT/ML Solostar Pen Inject 45 Units into the skin daily. 11/14/23   Modena Andes, MD  Insulin  Pen Needle (BD PEN NEEDLE NANO 2ND GEN) 32G X 4 MM MISC Inject 1 each into the skin in the morning, at noon, in the evening, and at bedtime. 08/30/21   CoxBurleigh Carp, MD  isosorbide  mononitrate (IMDUR ) 30 MG 24 hr tablet Take 1 tablet (30 mg total) by mouth daily. 08/05/23   Sheryl Donna, NP   latanoprost  (XALATAN ) 0.005 % ophthalmic solution Place 1 drop into both eyes at bedtime. 08/14/23   Mercy Stall, MD  linaclotide  (LINZESS ) 145 MCG CAPS capsule Take 1 capsule (145 mcg total) by mouth daily before breakfast. 04/29/23   Cox, Kirsten, MD  LORazepam  (ATIVAN ) 0.5 MG tablet Take 1 tablet (0.5 mg total) by mouth daily as needed for anxiety. 11/15/23   Janece Means, FNP  magnesium  oxide (MAG-OX) 400 MG tablet Take 2 tablets (800 mg total) by mouth daily. Take 2 (400 mg) daily=800 mg 04/29/23   Cox, Kirsten, MD  Menthol -Methyl Salicylate  (ICY HOT EXTRA STRENGTH) 10-30 % CREA Apply 1 Application topically as needed (bilateral hips and knees). 10/16/23   Cyndi Drain, PA-C  methocarbamol  (ROBAXIN ) 500 MG tablet Take 1 tablet (500 mg total) by mouth every 6 (six) hours as needed for muscle spasms. 11/14/23   Versie Gores, PA-C  nitroGLYCERIN  (NITROSTAT ) 0.4 MG SL tablet DISSOLVE ONE TABLET UNDER THE TONGUE EVERY 5 MINUTES AS NEEDED FOR CHEST PAIN.  DO NOT EXCEED A TOTAL OF 3 DOSES IN 15 MINUTES 04/29/23   Cox, Kirsten, MD  Omega-3 Fatty Acids (FISH OIL PO) Take 1 capsule by mouth daily.    [provider]  orphenadrine  (NORFLEX ) 100 MG tablet Take 1 tablet (100 mg total) by mouth 2 (two) times daily. 11/01/23   Odilia Bennett, PA  OXYGEN  Inhale 2 L/min into the lungs at bedtime.    [provider]  pantoprazole  (PROTONIX ) 40 MG tablet Take 1 tablet (40 mg total) by mouth daily. 12/12/23   CoxBurleigh Carp, MD  potassium chloride  SA (KLOR-CON  M) 20 MEQ tablet Take 1 tablet (20 mEq total) by mouth daily. 08/30/23   Deforest Fast, MD  Probiotic CAPS Take 1 capsule by mouth daily.    [provider]  promethazine  (  PHENERGAN ) 25 MG tablet Take 1 tablet (25 mg total) by mouth daily as needed. 11/15/23   Janece Means, FNP  ticagrelor  (BRILINTA ) 90 MG TABS tablet Take 1 tablet (90 mg total) by mouth 2 (two) times daily. 04/29/23   CoxBurleigh Carp, MD  torsemide  (DEMADEX ) 20 MG tablet  Take 3 tablets (60 mg total) by mouth daily. 10/17/23   Bensimhon, Rheta Celestine, MD    Physical Exam: Vitals:   12/14/23 0052 12/14/23 0100  BP: (!) 96/41 (!) 104/47  Pulse: 70 70  Resp: 15 16  Temp: 97.8 F (36.6 C) 97.8 F (36.6 C)  TempSrc: Oral Oral  SpO2: 100% 100%    Constitutional: NAD, no diaphoresis   Eyes: PERTLA, lids and conjunctivae normal ENMT: Mucous membranes are dry. Posterior pharynx clear of any exudate or lesions.   Neck: supple, no masses  Respiratory:U no wheezing, no crackles. No accessory muscle use.  Cardiovascular: S1 & S2 heard, regular rate and rhythm. No JVD. Abdomen: Soft, no guarding. Bowel sounds active.  Musculoskeletal: no clubbing / cyanosis. Left ankle erythema, edema, heat, and purulent drainage.   Skin: Erythema, edema, and heat involving distal LLE with purulent drainage from heel wound. Warm, dry, well-perfused. Neurologic: CN 2-12 grossly intact. Moving all extremities. Alert and oriented.  Psychiatric: Calm. Cooperative.    Labs and Imaging on Admission: I have personally reviewed following labs and imaging studies  CBC: No results for input(s): "WBC", "NEUTROABS", "HGB", "HCT", "MCV", "PLT" in the last 168 hours. Basic Metabolic Panel: No results for input(s): "NA", "K", "CL", "CO2", "GLUCOSE", "BUN", "CREATININE", "CALCIUM ", "MG", "PHOS" in the last 168 hours. GFR: CrCl cannot be calculated (Patient's most recent lab result is older than the maximum 21 days allowed.). Liver Function Tests: No results for input(s): "AST", "ALT", "ALKPHOS", "BILITOT", "PROT", "ALBUMIN " in the last 168 hours. No results for input(s): "LIPASE", "AMYLASE" in the last 168 hours. No results for input(s): "AMMONIA" in the last 168 hours. Coagulation Profile: No results for input(s): "INR", "PROTIME" in the last 168 hours. Cardiac Enzymes: No results for input(s): "CKTOTAL", "CKMB", "CKMBINDEX", "TROPONINI" in the last 168 hours. BNP (last 3 results) No  results for input(s): "PROBNP" in the last 8760 hours. HbA1C: No results for input(s): "HGBA1C" in the last 72 hours. CBG: No results for input(s): "GLUCAP" in the last 168 hours. Lipid Profile: No results for input(s): "CHOL", "HDL", "LDLCALC", "TRIG", "CHOLHDL", "LDLDIRECT" in the last 72 hours. Thyroid  Function Tests: No results for input(s): "TSH", "T4TOTAL", "FREET4", "T3FREE", "THYROIDAB" in the last 72 hours. Anemia Panel: No results for input(s): "VITAMINB12", "FOLATE", "FERRITIN", "TIBC", "IRON ", "RETICCTPCT" in the last 72 hours. Urine analysis:    Component Value Date/Time   COLORURINE YELLOW 08/27/2023 0423   APPEARANCEUR HAZY (A) 08/27/2023 0423   LABSPEC 1.012 08/27/2023 0423   PHURINE 5.0 08/27/2023 0423   GLUCOSEU 50 (A) 08/27/2023 0423   HGBUR SMALL (A) 08/27/2023 0423   BILIRUBINUR negative 10/22/2023 0916   BILIRUBINUR negative 07/11/2021 0812   KETONESUR negative 10/22/2023 0916   KETONESUR NEGATIVE 08/27/2023 0423   PROTEINUR =100 (A) 10/22/2023 0916   PROTEINUR >=300 (A) 08/27/2023 0423   UROBILINOGEN 0.2 10/22/2023 0916   NITRITE Negative 10/22/2023 0916   NITRITE NEGATIVE 08/27/2023 0423   LEUKOCYTESUR Negative 10/22/2023 0916   LEUKOCYTESUR NEGATIVE 08/27/2023 0423   Sepsis Labs: @LABRCNTIP (procalcitonin:4,lacticidven:4) )No results found for this or any previous visit (from the past 240 hours).   Radiological Exams on Admission: No results found.  Assessment/Plan  1. Surgical site infection, left ankle; MRSA bacteremia  - Pt s/p surgical management of trimalleolar left ankle fracture/dislocation on 11/13/23  - Febrile on presentation, noted to have purulent drainage from surgical wounds  - Treat with vancomycin , repeat cultures, check echo, follow-up on orthopedic surgery recommendations    2. CAD  - No anginal complaints   - Hold antiplatelets pending ortho consultation, hold beta-blocker for now given marginal BP, continue Imdur  as tolerated     3. Chronic HFmrEF  - EF was 40-45% in February 2025  - Appears compensated; diuretic held and IVF given in setting of sepsis  - Hold Coreg  until BP improves, monitor weight and I/Os   4. CKD IV  - Appears close to baseline  - Renally-dose medications, monitor    5. Insulin -dependent DM  - A1c was 10.2% in April 2025  - Check CBGs, continue long- and short-acting insulin    6. OSA; chronic hypoxic respiratory failure  - Continue supplemental O2, continue CPAP while sleeping   7. Depression, anxiety  - Continue doxepin  and low-dose Ativan  as-needed    8. Anemia   - Appears close to baseline    9. UTI  - She presented with dysuria and flank pain and had urinary bladder wall-thickening with surrounding fat stranding on CT  - Treat with Rocephin    DVT prophylaxis: SCD  Code Status: Full  Level of Care: Level of care: Progressive Family Communication: none present   Disposition Plan:  Patient is from: Home   Anticipated d/c is to: TBD  Anticipated d/c date is: 12/17/23  Patient currently: Pending orthopedic surgery consultation, treatment of infection Consults called: Orthopedic surgery  Admission status: Inpatient     Walton Guppy, MD [facility]  12/14/2023, 1:22 AM

## 2023-12-15 ENCOUNTER — Inpatient Hospital Stay (HOSPITAL_COMMUNITY)

## 2023-12-15 DIAGNOSIS — E11610 Type 2 diabetes mellitus with diabetic neuropathic arthropathy: Secondary | ICD-10-CM | POA: Insufficient documentation

## 2023-12-15 DIAGNOSIS — R609 Edema, unspecified: Secondary | ICD-10-CM | POA: Diagnosis not present

## 2023-12-15 DIAGNOSIS — E43 Unspecified severe protein-calorie malnutrition: Secondary | ICD-10-CM | POA: Diagnosis not present

## 2023-12-15 DIAGNOSIS — R7881 Bacteremia: Secondary | ICD-10-CM | POA: Diagnosis not present

## 2023-12-15 DIAGNOSIS — T8149XA Infection following a procedure, other surgical site, initial encounter: Secondary | ICD-10-CM | POA: Diagnosis not present

## 2023-12-15 DIAGNOSIS — F411 Generalized anxiety disorder: Secondary | ICD-10-CM

## 2023-12-15 LAB — BPAM RBC
Blood Product Expiration Date: 202506172359
Unit Type and Rh: 600

## 2023-12-15 LAB — ECHOCARDIOGRAM COMPLETE
AR max vel: 1.76 cm2
AV Area VTI: 1.81 cm2
AV Area mean vel: 1.74 cm2
AV Mean grad: 3 mmHg
AV Peak grad: 6 mmHg
Ao pk vel: 1.22 m/s
Calc EF: 50.5 %
Height: 65 in
MV M vel: 5.04 m/s
MV Peak grad: 101.6 mmHg
MV VTI: 0.82 cm2
S' Lateral: 3.5 cm
Single Plane A2C EF: 49.2 %
Single Plane A4C EF: 50 %
Weight: 3456.81 [oz_av]

## 2023-12-15 LAB — PREPARE RBC (CROSSMATCH)

## 2023-12-15 LAB — URINE CULTURE: Culture: <10000 — AB

## 2023-12-15 LAB — GLUCOSE, CAPILLARY
Glucose-Capillary: 114 mg/dL — ABNORMAL HIGH (ref 70–99)
Glucose-Capillary: 120 mg/dL — ABNORMAL HIGH (ref 70–99)
Glucose-Capillary: 146 mg/dL — ABNORMAL HIGH (ref 70–99)
Glucose-Capillary: 153 mg/dL — ABNORMAL HIGH (ref 70–99)
Glucose-Capillary: 165 mg/dL — ABNORMAL HIGH (ref 70–99)
Glucose-Capillary: 170 mg/dL — ABNORMAL HIGH (ref 70–99)
Glucose-Capillary: 195 mg/dL — ABNORMAL HIGH (ref 70–99)

## 2023-12-15 LAB — CBC
HCT: 23.9 % — ABNORMAL LOW (ref 36.0–46.0)
Hemoglobin: 7.5 g/dL — ABNORMAL LOW (ref 12.0–15.0)
MCH: 27.5 pg (ref 26.0–34.0)
MCHC: 31.4 g/dL (ref 30.0–36.0)
MCV: 87.5 fL (ref 80.0–100.0)
Platelets: 255 10*3/uL (ref 150–400)
RBC: 2.73 MIL/uL — ABNORMAL LOW (ref 3.87–5.11)
RDW: 15.5 % (ref 11.5–15.5)
WBC: 14.7 10*3/uL — ABNORMAL HIGH (ref 4.0–10.5)
nRBC: 0 % (ref 0.0–0.2)

## 2023-12-15 LAB — BASIC METABOLIC PANEL WITH GFR
Anion gap: 11 (ref 5–15)
BUN: 54 mg/dL — ABNORMAL HIGH (ref 6–20)
CO2: 21 mmol/L — ABNORMAL LOW (ref 22–32)
Calcium: 8.1 mg/dL — ABNORMAL LOW (ref 8.9–10.3)
Chloride: 101 mmol/L (ref 98–111)
Creatinine, Ser: 2.28 mg/dL — ABNORMAL HIGH (ref 0.44–1.00)
GFR, Estimated: 24 mL/min — ABNORMAL LOW (ref >60)
Glucose, Bld: 134 mg/dL — ABNORMAL HIGH (ref 70–99)
Potassium: 4.4 mmol/L (ref 3.5–5.1)
Sodium: 133 mmol/L — ABNORMAL LOW (ref 135–145)

## 2023-12-15 LAB — VANCOMYCIN, RANDOM: Vancomycin Rm: 21 ug/mL

## 2023-12-15 MED ORDER — PERFLUTREN LIPID MICROSPHERE
1.0000 mL | INTRAVENOUS | Status: AC | PRN
  Administered 2023-12-15: 2 mL via INTRAVENOUS

## 2023-12-15 MED ORDER — LORAZEPAM 0.5 MG PO TABS
0.5000 mg | ORAL_TABLET | Freq: Four times a day (QID) | ORAL | Status: DC | PRN
  Administered 2023-12-15 – 2023-12-26 (×13): 0.5 mg via ORAL
  Filled 2023-12-15 (×13): qty 1

## 2023-12-15 MED ORDER — ESCITALOPRAM OXALATE 10 MG PO TABS
10.0000 mg | ORAL_TABLET | Freq: Every day | ORAL | Status: DC
  Administered 2023-12-15 – 2023-12-27 (×13): 10 mg via ORAL
  Filled 2023-12-15 (×13): qty 1

## 2023-12-15 MED ORDER — SODIUM CHLORIDE 0.9% IV SOLUTION: Freq: Once | INTRAVENOUS | Status: AC

## 2023-12-15 MED ORDER — CARVEDILOL 6.25 MG PO TABS
6.2500 mg | ORAL_TABLET | Freq: Two times a day (BID) | ORAL | Status: DC
  Administered 2023-12-16 – 2023-12-22 (×12): 6.25 mg via ORAL
  Filled 2023-12-15 (×12): qty 1

## 2023-12-15 MED ORDER — FUROSEMIDE 10 MG/ML IJ SOLN
40.0000 mg | Freq: Once | INTRAMUSCULAR | Status: AC | PRN
  Administered 2023-12-15: 40 mg via INTRAVENOUS
  Filled 2023-12-15: qty 4

## 2023-12-15 MED ORDER — POVIDONE-IODINE 10 % EX SWAB
2.0000 | Freq: Once | CUTANEOUS | Status: DC
Start: 2023-12-15 — End: 2023-12-16

## 2023-12-15 MED ORDER — CHLORHEXIDINE GLUCONATE 4 % EX SOLN
60.0000 mL | Freq: Once | CUTANEOUS | Status: AC
  Administered 2023-12-15: 4 via TOPICAL
  Filled 2023-12-15: qty 60

## 2023-12-15 NOTE — Progress Notes (Signed)
Placed patient on home CPAP for the night  

## 2023-12-15 NOTE — Consult Note (Signed)
 [facility] Psychiatric Consult Initial  Patient Name: .Dana [name]  MRN: [id number]  DOB: 11-May-1961  Consult Order details:  Orders (From admission, onward)     Start     Ordered   12/15/23 1144  IP CONSULT TO PSYCHIATRY       Comments: This is nonurgent consult, patient is significantly depressed, as she will be going for left BKA, does appear to depressed, she is seeing a therapist as an outpatient, and is asking to talk to psychiatry to see it helps with her emotion.  Ordering Provider: Epifanio Haste, MD  Provider:  (Not yet assigned)  Question Answer Comment  Location [facility]   Reason for Consult? depression      12/15/23 1144             Mode of Visit: In person    Psychiatry Consult Evaluation  Service Date: December 15, 2023 LOS:  LOS: 1 day  Chief Complaint "Not so hot"  Primary Psychiatric Diagnoses  General anxiety disorder   Assessment  Dana [name] is a 63 y.o. female admitted: Medicallyfor 12/14/2023 12:50 AM for history significant for insulin -dependent diabetes mellitus, CKD stage IV, CAD, chronic hypoxic respiratory failure, OSA on CPAP, chronic HFmrEF, depression, anxiety, chronic pain, and left trimalleolar ankle fracture/dislocation status postoperative repair on 11/13/2023 who presented to the [facility] ED the night of 12/12/2023 with fevers, dysuria, and flank pain, history of anxiety.  Her current presentation of excessive worry, feeling overwhelmed, feelings of pending doom, and difficulty concentrating is most consistent with GAD. Current outpatient psychotropic medications include Lexapro, Ativan , gabapentin  and historically she has had a positive response to these medications. She was compliant with medications prior to admission as evidenced by self-report. On initial examination, patient pleasant and easily expressed her thoughts and concerns. Please see plan below for detailed recommendations.   Diagnoses:  Active Hospital  problems: Principal Problem:   Surgical site infection Active Problems:   Generalized anxiety disorder   Chronic combined systolic (congestive) and diastolic (congestive) heart failure (HCC)   Chronic respiratory failure with hypoxia (HCC)   OSA on CPAP   Uncontrolled type 1 diabetes mellitus with hyperglycemia, with long-term current use of insulin  (HCC)   CKD stage 4 due to type 1 diabetes mellitus (HCC)   Coronary artery disease involving native coronary artery of native heart with angina pectoris (HCC)   MRSA bacteremia   Depression with anxiety   UTI (urinary tract infection)   Severe protein-calorie malnutrition (HCC)   Charcot's joint of foot in type 2 diabetes mellitus (HCC)    Plan   ## Psychiatric Medication Recommendations:  Continue Lexapro 10 mg daily (just started recently) Continue gabapentin  300 mg TID  Reduced Ativan  0.5 mg QID to BID PRN as she was only taking it at night at home  ## Medical Decision Making Capacity: Not specifically addressed in this encounter  ## Further Work-up:  -- most recent EKG on 11/11/2023 had QtC of 455 -- Pertinent labwork reviewed earlier this admission includes: chem panel, CBG, EKG, urine   ## Disposition:-- There are no psychiatric contraindications to discharge at this time  ## Behavioral / Environmental: - No specific recommendations at this time.     ## Safety and Observation Level:  - Based on my clinical evaluation, I estimate the patient to be at low risk of self harm in the current setting. - At this time, we recommend  routine. This decision is based on my review of  the chart including patient's history and current presentation, interview of the patient, mental status examination, and consideration of suicide risk including evaluating suicidal ideation, plan, intent, suicidal or self-harm behaviors, risk factors, and protective factors. This judgment is based on our ability to directly address suicide risk, implement  suicide prevention strategies, and develop a safety plan while the patient is in the clinical setting. Please contact our team if there is a concern that risk level has changed.  CSSR Risk Category:   Suicide Risk Assessment: Patient has following modifiable risk factors for suicide: none which we are addressing by therapy. Patient has following non-modifiable or demographic risk factors for suicide: none Patient has the following protective factors against suicide: Access to outpatient mental health care, Supportive family, Supportive friends, Cultural, spiritual, or religious beliefs that discourage suicide, Pets in the home, Frustration tolerance, no history of suicide attempts, and no history of NSSIB  Thank you for this consult request. Recommendations have been communicated to the primary team.  We will continue to follow at this time.   Roslynn Coombes, NP       History of Present Illness  Relevant Aspects of [facility] Course:  Admitted on 12/14/2023 for history significant for insulin -dependent diabetes mellitus, CKD stage IV, CAD, chronic hypoxic respiratory failure, OSA on CPAP, chronic HFmrEF, depression, anxiety, chronic pain, and left trimalleolar ankle fracture/dislocation status postoperative repair on 11/13/2023 who presented to the [facility] ED the night of 12/12/2023 with fevers, dysuria, and flank pain; history of anxiety  Patient Report:  63 yo female who was admitted to the hospital from [place] due to possible MRSA in her left fractured ankle.  She was told yesterday that it would have to be amputated below the knew which is a stressor for her and voiced frustration that the surgery is being delayed due to her UTI and need for stabilization.  This has increased her anxiety to a high level with constant worry, difficulty concentrating, catastrophic thinking.  Prior to admission, her anxiety was predominately high at night to which her PCP gave her PRN Ativan  that helps her.   Lexapro just started with an increase in her Ativan , gabapentin  also in place.  She voiced her depression "is not that high", no suicidal ideations.  Sleep is fair prior to admission due to her ankle pain.  Appetite is nonexistent since admission, she is drinking water consistently.  No psychosis, homicidal ideations, paranoia, or substance abuse.  She lives with her husband and her mother moved in a couple of years ago.  Two adult daughters nearby that are supportive along with her husband, mother, and other family members. She has had a multitude of health issues and concerned about how much her body can withstand.    During the assessment, she was able to express herself without issues and processed many emotions from childhood with her father being murdered when they lived in [place] to recent events.  Discussed 3 Good Things for her to practice since she has always held the attitude of "If it can go wrong with me, it will" to assist in diverting her negative thought process.  Psych will continue to provide emotional support and management of her symptoms.  Psych ROS:  Depression: low Anxiety:  high Mania (lifetime and current): none Psychosis: (lifetime and current): none   Review of Systems  Psychiatric/Behavioral:  The patient is nervous/anxious.      Psychiatric and Social History  Psychiatric History:  Information collected from patient and chart  Prev Dx/Sx: anxiety Current Psych Provider: none Home Meds (current): Lexapro, gabapentin , Ativan  Previous Med Trials: none Therapy: none  Prior Psych Hospitalization: none  Prior Self Harm: none Prior Violence: none  Family Psych History: none Family Hx suicide: none  Social History:  Occupational Hx: disability Living Situation: lives with her husband Spiritual Hx: yes Access to weapons/lethal means: none   Substance History No substance abuse history  Exam Findings  Physical Exam: completed by the MD,  reviewed Vital Signs:  Temp:  [97.6 F (36.4 C)-99.8 F (37.7 C)] 98.3 F (36.8 C) (06/01 1105) Pulse Rate:  [70-85] 84 (06/01 1045) Resp:  [14-22] 17 (06/01 1105) BP: (98-148)/(47-99) 131/57 (06/01 1105) SpO2:  [89 %-99 %] 98 % (06/01 1045) Blood pressure (!) 131/57, pulse 84, temperature 98.3 F (36.8 C), temperature source Oral, resp. rate 17, height 5\' 5"  (1.651 m), weight 98 kg, SpO2 98%. Body mass index is 35.95 kg/m.  Physical Exam Vitals and nursing note reviewed.  Constitutional:      Appearance: Normal appearance.  HENT:     Head: Normocephalic.     Nose: Nose normal.  Pulmonary:     Effort: Pulmonary effort is normal.  Musculoskeletal:     Cervical back: Normal range of motion.  Neurological:     General: No focal deficit present.     Mental Status: She is alert and oriented to person, place, and time.     Mental Status Exam: General Appearance: Casual  Orientation:  Full (Time, Place, and Person)  Memory:  Immediate;   Good Recent;   Good Remote;   Good  Concentration:  Concentration: Good and Attention Span: Good  Recall:  Good  Attention  Good  Eye Contact:  Fair  Speech:  Clear and Coherent  Language:  Good  Volume:  Normal  Mood: anxious  Affect:  Congruent  Thought Process:  Coherent  Thought Content:  logical  Suicidal Thoughts:  No  Homicidal Thoughts:  No  Judgement:  Good  Insight:  Good  Psychomotor Activity:  Decreased  Akathisia:  No  Fund of Knowledge:  Good      Assets:  Communication Skills Desire for Improvement Housing Intimacy Leisure Time Resilience Social Support  Cognition:  WNL  ADL's:  Impaired  AIMS (if indicated):        Other History   These have been pulled in through the EMR, reviewed, and updated if appropriate.  Family History:  The patient's family history includes Bipolar disorder in an other family member; Breast cancer (age of onset: 57) in her mother; Cancer in an other family member; Coronary  artery disease in her mother; Depression in an other family member; Diabetes in her father; Diabetes type II in her mother; Hyperlipidemia in her mother; Hypertension in her father and mother; Lung cancer (age of onset: 46) in her mother; Mental illness in her sister; Schizophrenia in an other family member.  Medical History: Past Medical History:  Diagnosis Date   Acquired absence of left great toe (HCC)    Acquired absence of other left toe(s) (HCC)    ACS (acute coronary syndrome) (HCC)    Acute bronchitis due to COVID-19 virus 03/03/2023   Acute on chronic combined systolic and diastolic CHF (congestive heart failure) (HCC) 09/16/2023   Acute respiratory failure with hypoxia (HCC) 07/20/2023   Anemia    Anoxic brain injury (HCC)    Anxiety    CAD (coronary artery disease)    DES mLAD 04/07/15; DES dRCA &  DES pPDA 08/30/16; DES mCX & PTCA OM1 09/29/20   Chronic diastolic (congestive) heart failure (HCC)    Chronic pain    Chronic systolic (congestive) heart failure (HCC)    CKD (chronic kidney disease)    Contrast dye induced nephropathy, possible 09/03/2020   COPD (chronic obstructive pulmonary disease) (HCC)    COVID-19 virus infection 03/03/2023   Demand ischemia (HCC)    Diabetes (HCC)type 1    Diastolic CHF (HCC)    Dyspnea    Encounter for screening mammogram for malignant neoplasm of breast 09/16/2023   Family history of breast cancer 10/23/2021   Gastro-esophageal reflux disease without esophagitis    Hyperlipidemia    Hypertension    Hypertensive heart disease with heart failure (HCC)    Hypoxemia    Idiopathic gout, multiple sites    Leukocytosis 03/29/2022   Localized edema    Low back pain    Mitral regurgitation    Mitral regurgitation    Mixed hyperlipidemia    Mixed incontinence    Morbid (severe) obesity due to excess calories (HCC)    Neuromuscular disorder (HCC)    Neuropathy    Non-ST elevation (NSTEMI) myocardial infarction (HCC)    08/29/20, 09/29/20    OSA (obstructive sleep apnea) 09/03/2020   Other specified hypothyroidism    PAF (paroxysmal atrial fibrillation) (HCC)    s/p pulmonary vein isolation by cryoablation 10/04/15 Dr. Jearldine Mina in [facility]   PEA (Pulseless electrical activity) [facility]    ~ 01/13/21 at [facility] during admission DKA, volume overload, possible CAP, hypoxia s/p ACLS with ROSC ~ 10-15   Pneumonia    Primary insomnia    Rheumatoid arthritis (HCC)    Stroke (HCC)    Thyroid  disease    Type 1 diabetes mellitus (HCC)    URI with cough and congestion 08/22/2023   Vitamin D  deficiency     Surgical History: Past Surgical History:  Procedure Laterality Date   ABDOMINAL HYSTERECTOMY     Partial- Still has both ovaries   ANKLE FUSION Left 11/13/2023   Procedure: HINDFOOT FUSION FIXATION OF ANKLE;  Surgeon: Laneta Pintos, MD;  Location: [facility] OR;  Service: Orthopedics;  Laterality: Left;   CARDIOVASCULAR STRESS TEST  08/13/2014   Nuclear; Normal   CARPAL TUNNEL RELEASE     CATARACT EXTRACTION, BILATERAL     CESAREAN SECTION     CHOLECYSTECTOMY     CORONARY ANGIOPLASTY WITH STENT PLACEMENT     3 blockages/ 1 stent   CORONARY BALLOON ANGIOPLASTY N/A 07/18/2023   Procedure: CORONARY BALLOON ANGIOPLASTY;  Surgeon: Odie Benne, MD;  Location: [facility] INVASIVE CV LAB;  Service: Cardiovascular;  Laterality: N/A;   CORONARY PRESSURE/FFR STUDY N/A 09/07/2022   Procedure: INTRAVASCULAR PRESSURE WIRE/FFR STUDY;  Surgeon: Swaziland, Peter M, MD;  Location: [facility] INVASIVE CV LAB;  Service: Cardiovascular;  Laterality: N/A;   CORONARY STENT INTERVENTION N/A 09/29/2020   Procedure: CORONARY STENT INTERVENTION;  Surgeon: Arnoldo Lapping, MD;  Location: [facility] INVASIVE CV LAB;  Service: Cardiovascular;  Laterality: N/A;  CFX   CORONARY STENT INTERVENTION N/A 09/07/2022   Procedure: CORONARY STENT INTERVENTION;  Surgeon: Swaziland, Peter M, MD;  Location: [facility] INVASIVE CV LAB;  Service: Cardiovascular;  Laterality: N/A;   CORONARY STENT  INTERVENTION N/A 02/13/2023   Procedure: CORONARY STENT INTERVENTION;  Surgeon: Odie Benne, MD;  Location: [facility] INVASIVE CV LAB;  Service: Cardiovascular;  Laterality: N/A;  LAD   CORONARY ULTRASOUND/IVUS N/A 02/13/2023   Procedure: Coronary Ultrasound/IVUS;  Surgeon: Odie Benne, MD;  Location: [facility] INVASIVE CV LAB;  Service: Cardiovascular;  Laterality: N/A;   CORONARY/GRAFT ACUTE MI REVASCULARIZATION N/A 12/08/2022   Procedure: Coronary/Graft Acute MI Revascularization;  Surgeon: Arleen Lacer, MD;  Location: [facility] INVASIVE CV LAB;  Service: Cardiovascular;  Laterality: N/A;   EYE SURGERY     LEFT HEART CATH AND CORONARY ANGIOGRAPHY N/A 09/29/2020   Procedure: LEFT HEART CATH AND CORONARY ANGIOGRAPHY;  Surgeon: Arnoldo Lapping, MD;  Location: [facility] INVASIVE CV LAB;  Service: Cardiovascular;  Laterality: N/A;   LEFT HEART CATH AND CORONARY ANGIOGRAPHY N/A 09/07/2022   Procedure: LEFT HEART CATH AND CORONARY ANGIOGRAPHY;  Surgeon: Swaziland, Peter M, MD;  Location: [facility] INVASIVE CV LAB;  Service: Cardiovascular;  Laterality: N/A;   LEFT HEART CATH AND CORONARY ANGIOGRAPHY N/A 12/08/2022   Procedure: LEFT HEART CATH AND CORONARY ANGIOGRAPHY;  Surgeon: Arleen Lacer, MD;  Location: [facility] INVASIVE CV LAB;  Service: Cardiovascular;  Laterality: N/A;   LEFT HEART CATH AND CORONARY ANGIOGRAPHY N/A 02/13/2023   Procedure: LEFT HEART CATH AND CORONARY ANGIOGRAPHY;  Surgeon: Odie Benne, MD;  Location: [facility] INVASIVE CV LAB;  Service: Cardiovascular;  Laterality: N/A;   LEFT HEART CATH AND CORONARY ANGIOGRAPHY N/A 07/16/2023   Procedure: LEFT HEART CATH AND CORONARY ANGIOGRAPHY;  Surgeon: Odie Benne, MD;  Location: [facility] INVASIVE CV LAB;  Service: Cardiovascular;  Laterality: N/A;   RIGHT HEART CATH N/A 04/27/2021   Procedure: RIGHT HEART CATH;  Surgeon: Mardell Shade, MD;  Location: [facility] INVASIVE CV LAB;  Service: Cardiovascular;  Laterality: N/A;   RIGHT/LEFT HEART  CATH AND CORONARY ANGIOGRAPHY N/A 01/07/2017   Procedure: Right/Left Heart Cath and Coronary Angiography;  Surgeon: Darlis Eisenmenger, MD;  Location: [facility] INVASIVE CV LAB;  Service: Cardiovascular;  Laterality: N/A;   ROTATOR CUFF REPAIR     TEE WITHOUT CARDIOVERSION N/A 04/28/2021   Procedure: TRANSESOPHAGEAL ECHOCARDIOGRAM (TEE);  Surgeon: Mardell Shade, MD;  Location: [facility] ENDOSCOPY;  Service: Cardiovascular;  Laterality: N/A;   TEE WITHOUT CARDIOVERSION N/A 05/18/2021   Procedure: TRANSESOPHAGEAL ECHOCARDIOGRAM (TEE);  Surgeon: Thukkani, Arun K, MD;  Location: [facility] INVASIVE CV LAB;  Service: Cardiovascular;  Laterality: N/A;   TOE AMPUTATION Left    TRANSCATHETER MITRAL EDGE TO EDGE REPAIR N/A 05/18/2021   Procedure: MITRAL VALVE REPAIR;  Surgeon: Kyra Phy, MD;  Location: [facility] INVASIVE CV LAB;  Service: Cardiovascular;  Laterality: N/A;   US  ECHOCARDIOGRAPHY  05/2017   Normal     Medications:   Current Facility-Administered Medications:    acetaminophen  (TYLENOL ) tablet 650 mg, 650 mg, Oral, Q6H PRN **OR** acetaminophen  (TYLENOL ) suppository 650 mg, 650 mg, Rectal, Q6H PRN, Opyd, Santana Cue, MD   allopurinol  (ZYLOPRIM ) tablet 50 mg, 50 mg, Oral, Daily, Opyd, Timothy S, MD, 50 mg at 12/15/23 0817   aspirin  EC tablet 81 mg, 81 mg, Oral, Daily, Elgergawy, Dawood S, MD, 81 mg at 12/15/23 1610   carvedilol  (COREG ) tablet 6.25 mg, 6.25 mg, Oral, BID WC, Elgergawy, Dawood S, MD   Chlorhexidine  Gluconate Cloth 2 % PADS 6 each, 6 each, Topical, Daily, Elgergawy, Ardia Kraft, MD, 6 each at 12/15/23 0818   diphenhydrAMINE  (BENADRYL ) capsule 25 mg, 25 mg, Oral, Once, Elgergawy, Dawood S, MD   escitalopram (LEXAPRO) tablet 10 mg, 10 mg, Oral, Daily, Elgergawy, Dawood S, MD, 10 mg at 12/15/23 1205   gabapentin  (NEURONTIN ) capsule 300 mg, 300 mg, Oral, TID, Opyd, Timothy S, MD, 300 mg at 12/15/23 0816   heparin   injection 5,000 Units, 5,000 Units, Subcutaneous, Q8H, Elgergawy, Dawood S, MD, 5,000  Units at 12/15/23 0524   HYDROmorphone  (DILAUDID ) injection 0.5 mg, 0.5 mg, Intravenous, Q4H PRN, Elgergawy, Dawood S, MD, 0.5 mg at 12/15/23 1102   insulin  aspart (novoLOG ) injection 0-9 Units, 0-9 Units, Subcutaneous, Q4H, Opyd, Timothy S, MD, 2 Units at 12/15/23 1205   insulin  glargine-yfgn (SEMGLEE ) injection 10 Units, 10 Units, Subcutaneous, Daily, Opyd, Timothy S, MD, 10 Units at 12/15/23 1205   latanoprost  (XALATAN ) 0.005 % ophthalmic solution 1 drop, 1 drop, Both Eyes, QHS, Elgergawy, Ardia Kraft, MD, 1 drop at 12/14/23 2147   LORazepam  (ATIVAN ) tablet 0.5 mg, 0.5 mg, Oral, QID PRN, Elgergawy, Dawood S, MD, 0.5 mg at 12/15/23 1205   oxyCODONE  (Oxy IR/ROXICODONE ) immediate release tablet 5 mg, 5 mg, Oral, Q4H PRN, Opyd, Timothy S, MD, 5 mg at 12/15/23 0817   pantoprazole  (PROTONIX ) EC tablet 40 mg, 40 mg, Oral, Daily, Opyd, Timothy S, MD, 40 mg at 12/15/23 0817   piperacillin-tazobactam (ZOSYN) IVPB 3.375 g, 3.375 g, Intravenous, Q8H, Sheron Dixons, RPH, Last Rate: 12.5 mL/hr at 12/15/23 0526, 3.375 g at 12/15/23 0526   povidone-iodine 10 % swab 2 Application, 2 Application, Topical, Once, Osa Blase, MD   prochlorperazine  (COMPAZINE ) injection 5 mg, 5 mg, Intravenous, Q6H PRN, Opyd, Timothy S, MD, 5 mg at 12/14/23 0725   senna (SENOKOT) tablet 8.6 mg, 1 tablet, Oral, Daily PRN, Opyd, Santana Cue, MD   sodium chloride  flush (NS) 0.9 % injection 10-40 mL, 10-40 mL, Intracatheter, PRN, Elgergawy, Dawood S, MD   sodium chloride  flush (NS) 0.9 % injection 3 mL, 3 mL, Intravenous, Q12H, Opyd, Timothy S, MD, 3 mL at 12/15/23 0810   [START ON 12/16/2023] Vancomycin  (VANCOCIN ) 1,250 mg in sodium chloride  0.9 % 250 mL IVPB, 1,250 mg, Intravenous, Q48H, Adaline Ada, RPH  Allergies: Allergies  Allergen Reactions   Naproxen Hives and Rash   Shellfish-Derived Products Hives, Swelling and Other (See Comments)    Angioedema    Strawberry (Diagnostic) Hives   Celecoxib Other (See Comments)     Stomach pain   Doxepin  Hcl Other (See Comments)    Lethargic, sleeping a lot per pt and family   Glucosamine Hives, Swelling and Other (See Comments)    Angioedema    Iron  Hives and Other (See Comments)    IV iron  transfusion - hives - Feb 2022 hospitalization  Patient said she CAN tolerate if given Benadryl  first   Metoclopramide Other (See Comments)    Facial twitching and stuttering   Metolazone  Other (See Comments)    Acute renal failure   Statins Other (See Comments)    Muscle pain    Roslynn Coombes, NP

## 2023-12-15 NOTE — Consult Note (Signed)
 ORTHOPAEDIC CONSULTATION  REQUESTING PHYSICIAN: Elgergawy, Ardia Kraft, MD  Chief Complaint: Cellulitis and ulceration left foot and ankle.  HPI: Dana [name] is a 63 y.o. female who presents with uncontrolled type 2 diabetes.  She is status post tibial calcaneal stabilization for a bimalleolar ankle fracture.  Patient has had progressive Charcot arthropathy changes with failure of the internal fixation.  Past Medical History:  Diagnosis Date   Acquired absence of left great toe (HCC)    Acquired absence of other left toe(s) (HCC)    ACS (acute coronary syndrome) (HCC)    Acute bronchitis due to COVID-19 virus 03/03/2023   Acute on chronic combined systolic and diastolic CHF (congestive heart failure) (HCC) 09/16/2023   Acute respiratory failure with hypoxia (HCC) 07/20/2023   Anemia    Anoxic brain injury (HCC)    Anxiety    CAD (coronary artery disease)    DES mLAD 04/07/15; DES dRCA & DES pPDA 08/30/16; DES mCX & PTCA OM1 09/29/20   Chronic diastolic (congestive) heart failure (HCC)    Chronic pain    Chronic systolic (congestive) heart failure (HCC)    CKD (chronic kidney disease)    Contrast dye induced nephropathy, possible 09/03/2020   COPD (chronic obstructive pulmonary disease) (HCC)    COVID-19 virus infection 03/03/2023   Demand ischemia (HCC)    Diabetes (HCC)type 1    Diastolic CHF (HCC)    Dyspnea    Encounter for screening mammogram for malignant neoplasm of breast 09/16/2023   Family history of breast cancer 10/23/2021   Gastro-esophageal reflux disease without esophagitis    Hyperlipidemia    Hypertension    Hypertensive heart disease with heart failure (HCC)    Hypoxemia    Idiopathic gout, multiple sites    Leukocytosis 03/29/2022   Localized edema    Low back pain    Mitral regurgitation    Mitral regurgitation    Mixed hyperlipidemia    Mixed incontinence    Morbid (severe) obesity due to excess calories (HCC)    Neuromuscular disorder (HCC)     Neuropathy    Non-ST elevation (NSTEMI) myocardial infarction (HCC)    08/29/20, 09/29/20   OSA (obstructive sleep apnea) 09/03/2020   Other specified hypothyroidism    PAF (paroxysmal atrial fibrillation) (HCC)    s/p pulmonary vein isolation by cryoablation 10/04/15 Dr. Jearldine Mina in [facility]   PEA (Pulseless electrical activity) [facility])    ~ 01/13/21 at [facility] during admission DKA, volume overload, possible CAP, hypoxia s/p ACLS with ROSC ~ 10-15   Pneumonia    Primary insomnia    Rheumatoid arthritis (HCC)    Stroke (HCC)    Thyroid  disease    Type 1 diabetes mellitus (HCC)    URI with cough and congestion 08/22/2023   Vitamin D  deficiency    Past Surgical History:  Procedure Laterality Date   ABDOMINAL HYSTERECTOMY     Partial- Still has both ovaries   ANKLE FUSION Left 11/13/2023   Procedure: HINDFOOT FUSION FIXATION OF ANKLE;  Surgeon: Laneta Pintos, MD;  Location: [facility] OR;  Service: Orthopedics;  Laterality: Left;   CARDIOVASCULAR STRESS TEST  08/13/2014   Nuclear; Normal   CARPAL TUNNEL RELEASE     CATARACT EXTRACTION, BILATERAL     CESAREAN SECTION     CHOLECYSTECTOMY     CORONARY ANGIOPLASTY WITH STENT PLACEMENT     3 blockages/ 1 stent   CORONARY BALLOON ANGIOPLASTY N/A 07/18/2023   Procedure: CORONARY BALLOON ANGIOPLASTY;  Surgeon: Odie Benne, MD;  Location: [facility] INVASIVE CV LAB;  Service: Cardiovascular;  Laterality: N/A;   CORONARY PRESSURE/FFR STUDY N/A 09/07/2022   Procedure: INTRAVASCULAR PRESSURE WIRE/FFR STUDY;  Surgeon: Swaziland, Peter M, MD;  Location: [facility] INVASIVE CV LAB;  Service: Cardiovascular;  Laterality: N/A;   CORONARY STENT INTERVENTION N/A 09/29/2020   Procedure: CORONARY STENT INTERVENTION;  Surgeon: Arnoldo Lapping, MD;  Location: [facility] INVASIVE CV LAB;  Service: Cardiovascular;  Laterality: N/A;  CFX   CORONARY STENT INTERVENTION N/A 09/07/2022   Procedure: CORONARY STENT INTERVENTION;  Surgeon: Swaziland, Peter M, MD;  Location: [facility] INVASIVE  CV LAB;  Service: Cardiovascular;  Laterality: N/A;   CORONARY STENT INTERVENTION N/A 02/13/2023   Procedure: CORONARY STENT INTERVENTION;  Surgeon: Odie Benne, MD;  Location: [facility] INVASIVE CV LAB;  Service: Cardiovascular;  Laterality: N/A;  LAD   CORONARY ULTRASOUND/IVUS N/A 02/13/2023   Procedure: Coronary Ultrasound/IVUS;  Surgeon: Odie Benne, MD;  Location: [facility] INVASIVE CV LAB;  Service: Cardiovascular;  Laterality: N/A;   CORONARY/GRAFT ACUTE MI REVASCULARIZATION N/A 12/08/2022   Procedure: Coronary/Graft Acute MI Revascularization;  Surgeon: Arleen Lacer, MD;  Location: [facility] INVASIVE CV LAB;  Service: Cardiovascular;  Laterality: N/A;   EYE SURGERY     LEFT HEART CATH AND CORONARY ANGIOGRAPHY N/A 09/29/2020   Procedure: LEFT HEART CATH AND CORONARY ANGIOGRAPHY;  Surgeon: Arnoldo Lapping, MD;  Location: [facility] INVASIVE CV LAB;  Service: Cardiovascular;  Laterality: N/A;   LEFT HEART CATH AND CORONARY ANGIOGRAPHY N/A 09/07/2022   Procedure: LEFT HEART CATH AND CORONARY ANGIOGRAPHY;  Surgeon: Swaziland, Peter M, MD;  Location: [facility] INVASIVE CV LAB;  Service: Cardiovascular;  Laterality: N/A;   LEFT HEART CATH AND CORONARY ANGIOGRAPHY N/A 12/08/2022   Procedure: LEFT HEART CATH AND CORONARY ANGIOGRAPHY;  Surgeon: Arleen Lacer, MD;  Location: [facility] INVASIVE CV LAB;  Service: Cardiovascular;  Laterality: N/A;   LEFT HEART CATH AND CORONARY ANGIOGRAPHY N/A 02/13/2023   Procedure: LEFT HEART CATH AND CORONARY ANGIOGRAPHY;  Surgeon: Odie Benne, MD;  Location: [facility] INVASIVE CV LAB;  Service: Cardiovascular;  Laterality: N/A;   LEFT HEART CATH AND CORONARY ANGIOGRAPHY N/A 07/16/2023   Procedure: LEFT HEART CATH AND CORONARY ANGIOGRAPHY;  Surgeon: Odie Benne, MD;  Location: [facility] INVASIVE CV LAB;  Service: Cardiovascular;  Laterality: N/A;   RIGHT HEART CATH N/A 04/27/2021   Procedure: RIGHT HEART CATH;  Surgeon: Mardell Shade, MD;  Location: [facility] INVASIVE CV  LAB;  Service: Cardiovascular;  Laterality: N/A;   RIGHT/LEFT HEART CATH AND CORONARY ANGIOGRAPHY N/A 01/07/2017   Procedure: Right/Left Heart Cath and Coronary Angiography;  Surgeon: Darlis Eisenmenger, MD;  Location: [facility] INVASIVE CV LAB;  Service: Cardiovascular;  Laterality: N/A;   ROTATOR CUFF REPAIR     TEE WITHOUT CARDIOVERSION N/A 04/28/2021   Procedure: TRANSESOPHAGEAL ECHOCARDIOGRAM (TEE);  Surgeon: Mardell Shade, MD;  Location: [facility] ENDOSCOPY;  Service: Cardiovascular;  Laterality: N/A;   TEE WITHOUT CARDIOVERSION N/A 05/18/2021   Procedure: TRANSESOPHAGEAL ECHOCARDIOGRAM (TEE);  Surgeon: Thukkani, Arun K, MD;  Location: [facility] INVASIVE CV LAB;  Service: Cardiovascular;  Laterality: N/A;   TOE AMPUTATION Left    TRANSCATHETER MITRAL EDGE TO EDGE REPAIR N/A 05/18/2021   Procedure: MITRAL VALVE REPAIR;  Surgeon: Kyra Phy, MD;  Location: [facility] INVASIVE CV LAB;  Service: Cardiovascular;  Laterality: N/A;   US  ECHOCARDIOGRAPHY  05/2017   Normal   Social History   Socioeconomic History   Marital status: Married    Spouse  name: Wandalee Gust   Number of children: 2   Years of education: 51   Highest education level: Master's degree (e.g., MA, MS, MEng, MEd, MSW, MBA)  Occupational History   Occupation: on disability/retired early former Engineer, site  Tobacco Use   Smoking status: Former    Current packs/day: 0.00    Types: Cigarettes    Start date: 26    Quit date: 1984    Years since quitting: 41.4    Passive exposure: Past   Smokeless tobacco: Never  Vaping Use   Vaping status: Never Used  Substance and Sexual Activity   Alcohol use: Never   Drug use: Never   Sexual activity: Not on file  Other Topics Concern   Not on file  Social History Narrative   Not on file   Social Drivers of Health   Financial Resource Strain: Low Risk  (10/24/2023)   Overall Financial Resource Strain (CARDIA)    Difficulty of Paying Living Expenses: Not hard at all  Food Insecurity: No Food  Insecurity (12/14/2023)   Hunger Vital Sign    Worried About Running Out of Food in the Last Year: Never true    Ran Out of Food in the Last Year: Never true  Transportation Needs: No Transportation Needs (12/14/2023)   PRAPARE - Administrator, Civil Service (Medical): No    Lack of Transportation (Non-Medical): No  Physical Activity: Insufficiently Active (10/24/2023)   Exercise Vital Sign    Days of Exercise per Week: 3 days    Minutes of Exercise per Session: 30 min  Stress: Stress Concern Present (10/24/2023)   [employer] of Occupational Health - Occupational Stress Questionnaire    Feeling of Stress : To some extent  Social Connections: Moderately Integrated (12/14/2023)   Social Connection and Isolation Panel [NHANES]    Frequency of Communication with Friends and Family: More than three times a week    Frequency of Social Gatherings with Friends and Family: Three times a week    Attends Religious Services: More than 4 times per year    Active Member of Clubs or Organizations: No    Attends Banker Meetings: Never    Marital Status: Married   Family History  Problem Relation Age of Onset   Breast cancer Mother 30       contralateral breast ca dx 90   Coronary artery disease Mother    Hypertension Mother    Hyperlipidemia Mother    Diabetes type II Mother    Lung cancer Mother 64   Diabetes Father    Hypertension Father    Mental illness Sister    Bipolar disorder Other    Depression Other    Schizophrenia Other    Cancer Other        MGF's sisters x2; unknown type   - negative except otherwise stated in the family history section Allergies  Allergen Reactions   Naproxen Hives and Rash   Shellfish-Derived Products Hives, Swelling and Other (See Comments)    Angioedema    Strawberry (Diagnostic) Hives   Celecoxib Other (See Comments)    Stomach pain   Doxepin  Hcl Other (See Comments)    Lethargic, sleeping a lot per pt and family    Glucosamine Hives, Swelling and Other (See Comments)    Angioedema    Iron  Hives and Other (See Comments)    IV iron  transfusion - hives - Feb 2022 hospitalization  Patient said she CAN tolerate if given  Benadryl  first   Metoclopramide Other (See Comments)    Facial twitching and stuttering   Metolazone  Other (See Comments)    Acute renal failure   Statins Other (See Comments)    Muscle pain   Prior to Admission medications   Medication Sig Start Date End Date Taking? Authorizing Provider  acetaminophen  (TYLENOL ) 500 MG tablet Take 1,000 mg by mouth every 6 (six) hours as needed for mild pain (pain score 1-3) or headache.   Yes [provider]  allopurinol  (ZYLOPRIM ) 100 MG tablet Take 0.5 tablets (50 mg total) by mouth daily. 04/29/23  Yes Cox, Kirsten, MD  aspirin  EC 81 MG tablet Take 1 tablet (81 mg total) by mouth daily. Swallow whole. 08/13/23  Yes Cox, Kirsten, MD  busPIRone  (BUSPAR ) 15 MG tablet Take 15 mg by mouth 3 (three) times daily.   Yes [provider]  carvedilol  (COREG ) 6.25 MG tablet Take 1.5 tablets (9.375 mg total) by mouth 2 (two) times daily. 08/05/23  Yes Sheryl Donna, NP  Continuous Glucose Sensor (FREESTYLE LIBRE 2 SENSOR) MISC Inject 1 device into skin every 14 (fourteen) days. 11/15/23  Yes Janece Means, FNP  EPINEPHrine  0.3 mg/0.3 mL IJ SOAJ injection Use as directed for life-threatening allergic reaction. 04/29/23  Yes Cox, Kirsten, MD  Evolocumab  (REPATHA  SURECLICK) 140 MG/ML SOAJ Inject 140 mg into the skin every 14 (fourteen) days. 04/29/23  Yes Cox, Kirsten, MD  ezetimibe  (ZETIA ) 10 MG tablet Take 1 tablet (10 mg total) by mouth every evening. 04/29/23  Yes Cox, Kirsten, MD  gabapentin  (NEURONTIN ) 300 MG capsule Take 1 capsule (300 mg total) by mouth 3 (three) times daily. 11/01/23  Yes Craft, Mirian Ames, PA  Glucagon  (GVOKE HYPOPEN  2-PACK) 0.5 MG/0.1ML SOAJ Inject 0.5 mg into the skin daily as needed. Patient taking differently: Inject 0.5 mg into  the skin daily as needed (hypoglycemia). 07/12/23  Yes Cox, Kirsten, MD  HYDROcodone -acetaminophen  (NORCO) 7.5-325 MG tablet Take 1 tablet by mouth 3 (three) times daily as needed. 12/12/23  Yes Cox, Kirsten, MD  insulin  aspart (NOVOLOG  FLEXPEN) 100 UNIT/ML FlexPen Inject 2 - 10 Units before meals based on SSI. 04/29/23  Yes Cox, Kirsten, MD  insulin  glargine (LANTUS  SOLOSTAR) 100 UNIT/ML Solostar Pen Inject 45 Units into the skin daily. Patient taking differently: Inject 30 Units into the skin daily. 11/14/23  Yes Pahwani, Martha Slack, MD  Insulin  Pen Needle (BD PEN NEEDLE NANO 2ND GEN) 32G X 4 MM MISC Inject 1 each into the skin in the morning, at noon, in the evening, and at bedtime. 08/30/21  Yes Cox, Kirsten, MD  isosorbide  mononitrate (IMDUR ) 30 MG 24 hr tablet Take 1 tablet (30 mg total) by mouth daily. 08/05/23  Yes Sheryl Donna, NP  latanoprost  (XALATAN ) 0.005 % ophthalmic solution Place 1 drop into both eyes at bedtime. 08/14/23  Yes Cox, Kirsten, MD  linaclotide  (LINZESS ) 145 MCG CAPS capsule Take 1 capsule (145 mcg total) by mouth daily before breakfast. Patient taking differently: Take 145 mcg by mouth as needed (constipation). 04/29/23  Yes Cox, Kirsten, MD  LORazepam  (ATIVAN ) 0.5 MG tablet Take 1 tablet (0.5 mg total) by mouth daily as needed for anxiety. 11/15/23  Yes Janece Means, FNP  magnesium  oxide (MAG-OX) 400 MG tablet Take 2 tablets (800 mg total) by mouth daily. Take 2 (400 mg) daily=800 mg 04/29/23  Yes Cox, Kirsten, MD  Menthol -Methyl Salicylate  (ICY HOT EXTRA STRENGTH) 10-30 % CREA Apply 1 Application topically as needed (bilateral hips and knees).  10/16/23  Yes Cyndi Drain, PA-C  nitroGLYCERIN  (NITROSTAT ) 0.4 MG SL tablet DISSOLVE ONE TABLET UNDER THE TONGUE EVERY 5 MINUTES AS NEEDED FOR CHEST PAIN.  DO NOT EXCEED A TOTAL OF 3 DOSES IN 15 MINUTES 04/29/23  Yes Cox, Kirsten, MD  Omega-3 Fatty Acids (FISH OIL PO) Take 1 capsule by mouth daily.   Yes [provider]  OXYGEN  Inhale 2  L/min into the lungs at bedtime.   Yes [provider]  pantoprazole  (PROTONIX ) 40 MG tablet Take 1 tablet (40 mg total) by mouth daily. 12/12/23  Yes Cox, Kirsten, MD  potassium chloride  SA (KLOR-CON  M) 20 MEQ tablet Take 1 tablet (20 mEq total) by mouth daily. 08/30/23  Yes Deforest Fast, MD  Probiotic CAPS Take 1 capsule by mouth daily.   Yes [provider]  promethazine  (PHENERGAN ) 25 MG tablet Take 1 tablet (25 mg total) by mouth daily as needed. 11/15/23  Yes Janece Means, FNP  ticagrelor  (BRILINTA ) 90 MG TABS tablet Take 1 tablet (90 mg total) by mouth 2 (two) times daily. 04/29/23  Yes Cox, Kirsten, MD  torsemide  (DEMADEX ) 20 MG tablet Take 3 tablets (60 mg total) by mouth daily. 10/17/23  Yes Bensimhon, Daniel R, MD  vitamin D3 (CHOLECALCIFEROL ) 25 MCG tablet Take 2 tablets (2,000 Units total) by mouth 2 (two) times daily. 11/14/23 02/12/24 Yes McClung, Genevive Ket, PA-C  doxepin  (SINEQUAN ) 150 MG capsule Take 1 capsule (150 mg total) by mouth at bedtime. Patient not taking: Reported on 12/14/2023 11/01/23   Odilia Bennett, PA  methocarbamol  (ROBAXIN ) 500 MG tablet Take 1 tablet (500 mg total) by mouth every 6 (six) hours as needed for muscle spasms. Patient not taking: Reported on 12/14/2023 11/14/23   Versie Gores, PA-C  orphenadrine  (NORFLEX ) 100 MG tablet Take 1 tablet (100 mg total) by mouth 2 (two) times daily. Patient not taking: Reported on 12/14/2023 11/01/23   Odilia Bennett, PA   US  PELVIS (TRANSABDOMINAL ONLY) Result Date: 12/14/2023 CLINICAL DATA:  Pelvic inflammatory disease EXAM: TRANSABDOMINAL ULTRASOUND OF PELVIS TECHNIQUE: Transabdominal ultrasound examination of the pelvis was performed including evaluation of the uterus, ovaries, adnexal regions, and pelvic cul-de-sac. COMPARISON:  CT 12/14/2023 FINDINGS: Uterus Absent Right ovary Not visualized.  No definite adnexal mass identified. Left ovary Not visualized.  No definite adnexal mass identified. Other findings: Trace  simple appearing free fluid within the right adnexa best seen on cine images. IMPRESSION: 1. Status post hysterectomy. 2. Nonvisualization of the ovaries bilaterally. If indicated, pelvic MRI examination with contrast may be helpful for further evaluation. 3. Trace simple appearing free fluid within the right adnexa, nonspecific. Electronically Signed   By: Worthy Heads M.D.   On: 12/14/2023 19:23   CT RENAL STONE STUDY Result Date: 12/14/2023 CLINICAL DATA:  Sepsis with bilateral pyelonephritis. Evaluate for hydronephrosis. EXAM: CT ABDOMEN AND PELVIS WITHOUT CONTRAST TECHNIQUE: Multidetector CT imaging of the abdomen and pelvis was performed following the standard protocol without IV contrast. RADIATION DOSE REDUCTION: This exam was performed according to the departmental dose-optimization program which includes automated exposure control, adjustment of the mA and/or kV according to patient size and/or use of iterative reconstruction technique. COMPARISON:  CT abdomen and pelvis 12/13/2023. FINDINGS: Lower chest: There is atelectasis in the lung bases. There are trace bilateral pleural effusions. The heart is enlarged. Hepatobiliary: No focal liver abnormality is seen. Status post cholecystectomy. No biliary dilatation. Pancreas: Unremarkable. No pancreatic ductal dilatation or surrounding inflammatory changes. Spleen: Mildly enlarged. Adrenals/Urinary Tract: Adrenal glands  are unremarkable. Kidneys are normal, without renal calculi, focal lesion, or hydronephrosis. There is mild nonspecific bilateral perinephric fat stranding, mildly increased. Bladder is unremarkable. Stomach/Bowel: Stomach is within normal limits. Appendix is not seen. No evidence of bowel wall thickening, distention, or inflammatory changes. Vascular/Lymphatic: Aortic atherosclerosis. No enlarged abdominal or pelvic lymph nodes. There are extensive vascular calcifications the abdomen and pelvis. Reproductive: Uterus is surgically absent.  The left ovary appears enlarged with surrounding inflammatory stranding and fluid, a new finding. The right ovary appears within normal limits. Other: There is mild body wall edema which has increased. There is no ascites, but there is increasing presacral edema. Medication injection sites are seen in the subcutaneous tissues of the lower anterior abdominal wall. Musculoskeletal: No fracture is seen. IMPRESSION: 1. No hydronephrosis. 2. Mild nonspecific bilateral perinephric fat stranding, mildly increased. Correlate clinically for infection. 3. Enlarged left ovary with surrounding inflammatory stranding and fluid, a new finding. Findings may be related to ovarian torsion or infection. Recommend further evaluation with pelvic ultrasound. 4. Increasing body wall edema and presacral edema. 5. Trace bilateral pleural effusions. 6. Cardiomegaly. 7. Mild splenomegaly. 8. Aortic atherosclerosis. Aortic Atherosclerosis (ICD10-I70.0). Electronically Signed   By: Tyron Gallon M.D.   On: 12/14/2023 16:54   DG Ankle Complete Left Result Date: 12/14/2023 CLINICAL DATA:  Surgical site infection. EXAM: LEFT ANKLE COMPLETE - 3 VIEW COMPARISON:  12/13/2023 FINDINGS: Postop changes with subtalar fusion. Bimalleolar fractures. Ankle mortise disruption. Regional soft tissue swelling. IMPRESSION: Postop changes. Disrupted ankle mortise. Displaced fractures. Soft tissue swelling. Electronically Signed   By: Sydell Eva M.D.   On: 12/14/2023 08:30   DG CHEST PORT 1 VIEW Result Date: 12/14/2023 CLINICAL DATA:  Surgical site infection.  CVC placement. EXAM: PORTABLE CHEST 1 VIEW COMPARISON:  12/13/2023. FINDINGS: Prominent cardiac silhouette. Pulmonary vascular congestion. No focal consolidation. No pneumothorax or pleural effusion. Right IJ CVC tip overlies distal SVC. IMPRESSION: Prominent cardiac silhouette and vascular congestion. Electronically Signed   By: Sydell Eva M.D.   On: 12/14/2023 08:28   - pertinent  xrays, CT, MRI studies were reviewed and independently interpreted  Positive ROS: All other systems have been reviewed and were otherwise negative with the exception of those mentioned in the HPI and as above.  Physical Exam: General: Alert, no acute distress Psychiatric: Patient is competent for consent with normal mood and affect Lymphatic: No axillary or cervical lymphadenopathy Cardiovascular: No pedal edema Respiratory: No cyanosis, no use of accessory musculature GI: No organomegaly, abdomen is soft and non-tender    Images:  @ENCIMAGES @  Labs:  Lab Results  Component Value Date   HGBA1C 10.2 (H) 11/12/2023   HGBA1C 9.0 (H) 05/14/2023   HGBA1C 8.5 (H) 02/13/2023   ESRSEDRATE 94 (H) 03/23/2020   ESRSEDRATE 85 (H) 11/07/2017   CRP 25 (H) 03/23/2020   LABURIC 6.5 05/14/2023   LABURIC 9.0 (H) 01/19/2022   LABURIC 9.3 (H) 04/25/2021   REPTSTATUS PENDING 12/14/2023   REPTSTATUS PENDING 12/14/2023   CULT  12/14/2023    NO GROWTH < 12 HOURS Performed at [facility], [address] [place], [place] [address]    CULT  12/14/2023    NO GROWTH < 12 HOURS Performed at [facility], [address], [place], [place] [address]    LABORGA KLEBSIELLA PNEUMONIAE (A) 07/13/2023    Lab Results  Component Value Date   ALBUMIN  2.1 (L) 12/14/2023   ALBUMIN  2.4 (L) 11/12/2023   ALBUMIN  2.3 (L) 11/11/2023  LABURIC 6.5 05/14/2023   LABURIC 9.0 (H) 01/19/2022   LABURIC 9.3 (H) 04/25/2021        Latest Ref Rng & Units 12/15/2023    2:53 AM 12/14/2023    2:14 AM 11/14/2023    5:21 AM  CBC EXTENDED  WBC 4.0 - 10.5 K/uL 14.7  19.6  14.4   RBC 3.87 - 5.11 MIL/uL 2.73  2.66  2.90   Hemoglobin 12.0 - 15.0 g/dL 7.5  7.2  7.8   HCT 19.1 - 46.0 % 23.9  23.5  24.8   Platelets 150 - 400 K/uL 255  265  322   NEUT# 1.7 - 7.7 K/uL   12.8   Lymph# 0.7 - 4.0 K/uL   0.8     Neurologic: Patient does not have protective sensation bilateral lower  extremities.   MUSCULOSKELETAL:   Skin: Examination there is ulceration over the malleolar line as well as over the calcaneus.  Patient has ascending cellulitis up the ankle.  Radiograph shows hardware failure of the tibial calcaneal fusion with backing out of the screws.  Patient has extensive calcification of the posterior tibial and anterior tibial arteries.  Patient's hemoglobin is 7.5 after transfusion of packed red blood cells.  White cell count is decreased to 14.7 from 19.6.  Albumin  2.1.  Hemoglobin A1c 10.2  Assessment: Assessment: Charcot collapse left ankle with failed internal fixation with cellulitis and ulceration with peripheral vascular disease and uncontrolled type 2 diabetes.  Plan: Plan: Will plan for a left transtibial amputation.  Risks and benefits were discussed including risk of the wound not healing.  Patient states she understands wished to proceed at this time.  Order placed for transfusion with 2 additional units of packed red blood cells for surgery planned today at 1 PM.  Thank you for the consult and the opportunity to see Ms. Young Hensen, MD [facility] [phone] 6:52 AM

## 2023-12-15 NOTE — Progress Notes (Signed)
 OT Cancellation Note  Patient Details Name: Dana [name] MRN: [id number] DOB: 1961-04-22   Cancelled Treatment:    Reason Eval/Treat Not Completed: Patient not medically ready. Pt scheduled for L BKA 6/2, will complete evaluation post-op.   [name], OT  Acute Rehabilitation Services Office [phone] Secure chat preferred   [name] 12/15/2023, 9:37 AM

## 2023-12-15 NOTE — Progress Notes (Signed)
*  PRELIMINARY RESULTS* Echocardiogram 2D Echocardiogram has been performed.  Wiley Magan D Calie Buttrey 12/15/2023, 3:56 PM

## 2023-12-15 NOTE — Plan of Care (Signed)
  Problem: Education: Goal: Knowledge of General Education information will improve Description: Including pain rating scale, medication(s)/side effects and non-pharmacologic comfort measures Outcome: Progressing   Problem: Health Behavior/Discharge Planning: Goal: Ability to manage health-related needs will improve Outcome: Progressing   Problem: Clinical Measurements: Goal: Will remain free from infection Outcome: Progressing Goal: Diagnostic test results will improve Outcome: Progressing   Problem: Activity: Goal: Risk for activity intolerance will decrease Outcome: Progressing   Problem: Coping: Goal: Level of anxiety will decrease Outcome: Progressing   Problem: Elimination: Goal: Will not experience complications related to urinary retention Outcome: Progressing

## 2023-12-15 NOTE — Progress Notes (Signed)
 Bilateral lower extremity venous duplex has been completed. Preliminary results can be found in CV Proc through chart review.   12/15/23 11:21 AM Birda Buffy RVT

## 2023-12-15 NOTE — Progress Notes (Signed)
 PROGRESS NOTE    Dana [name]  ZOX:[id number] DOB: 1961-06-18 DOA: 12/14/2023 PCP: Mercy Stall, MD    No chief complaint on file.   Brief Narrative:   Dana [name] is a pleasant 63 y.o. female with medical history significant for insulin -dependent diabetes mellitus, CKD stage IV, CAD, chronic hypoxic respiratory failure, OSA on CPAP, chronic HFmrEF, depression, anxiety, chronic pain, and left trimalleolar ankle fracture/dislocation status postoperative repair on 11/13/2023 who presented to the [facility] ED the night of 12/12/2023 with fevers, dysuria, and flank pain.  Patient workup in [facility] ED concerning for pyelonephritis, then further workup reveals MRSA bacteremia, in the setting of patient right ankle surgery call site infection, she was transitioned to [facility] for further evaluation.  Assessment & Plan:   Principal Problem:   Surgical site infection Active Problems:   Chronic combined systolic (congestive) and diastolic (congestive) heart failure (HCC)   Chronic respiratory failure with hypoxia (HCC)   OSA on CPAP   Uncontrolled type 1 diabetes mellitus with hyperglycemia, with long-term current use of insulin  (HCC)   CKD stage 4 due to type 1 diabetes mellitus (HCC)   Coronary artery disease involving native coronary artery of native heart with angina pectoris (HCC)   MRSA bacteremia   Depression with anxiety   UTI (urinary tract infection)   Severe protein-calorie malnutrition (HCC)   Charcot's joint of foot in type 2 diabetes mellitus (HCC)  Severe sepsis, present on admission MRSA bacteremia Left ankle post op infection Bilateral pyelonephritis - Severe sepsis present on admission when she is at [facility], persist during this hospital stay as well - 2 sets of cultures x 2 bottles growing gram-positive cocci, MRSA by PCR - recent left ankle fracture repair, site appears infected, draining purulent discharge, ortho consulted. - Will  obtain 2D echo.  Will need TEE - Continue with IV vancomycin  for MRSA bacteremia. - CT abdomen pelvis significant for bilateral pyelonephritis, questionable finding in right ovary concerning for infection versus torsion, pelvic ultrasound has been obtained , broadened Rocephin  to IV Zosyn for better coverage, likely can narrow back to Rocephin  tomorrow . -Will DC midodrine now blood pressure has improved -  right IJ central line discontinued in the setting of her bacteremia  - Event CharcoAid collapse of left ankle with failed internal fixation with cellulitis and ulceration with peripheral vascular disease and uncontrolled diabetes mellitus, recommendation for left BKA, plan for surgery tomorrow.  CAD  Elevated troponins -She has had extensive CAD with prior PCI's and multiple evidence of in-stent restenosis in the proximal LAD with last intervention January 2025.   - scoring balloon angioplasty mid LAD 07/2023 -troponins were elevated at the hospital, 0.2, 0.18, 0.16, (the normal range is 0.04), this is secondary to sepsis, and CKD at baseline, no anginal symptoms. -She is on aspirin  and Brilinta , with recent balloon angioplasty, will resume on aspirin , hold Brilinta  in anticipation for surgery.  Will resume Brilinta  postoperatively once cleared by orthopedic.. - Now blood pressure has improved will resume on Coreg .   Chronic HFmrEF  - EF was 40-45% in February 2025  - Appears compensated; diuretic held and IVF given in setting of sepsis  - Repeat 2D echo is pending. - Resume Coreg  now blood pressure allows   CKD IV  - Appears close to baseline  - Renally-dose medications, monitor      Insulin -dependent DM  - A1c was 10.2% in April 2025  - Check CBGs, continue long- and short-acting insulin   OSA; chronic hypoxic respiratory failure  - Continue supplemental O2, continue CPAP while sleeping    Depression, anxiety  - Continue doxepin  and low-dose Ativan  as-needed    Anemia of  chronic kidney disease -Hemoglobin around baseline at 7.2, and her history of CAD and need for surgery she transfused 1 unit PRBC yesterday, another 2 units ordered today orthopedic in anticipation of her surgery given her cardiac history.   Depression/anxiety - Understandably patient is significantly anxious, as well depressed setting of these setbacks in her health, she requested psychiatry consult, which I have ordered, as well I have ordered spiritual care consults, and will dart her on low-dose Lexapro and increase her Ativan  to 3 times daily.       DVT prophylaxis: Heparin  Code Status: Full Family Communication: none at bedside Disposition:   Status is: Inpatient   The patient is critically ill with multi-organ failure.  Critical care was necessary to treat or prevent imminent or life-threatening deterioration of severe sepsis, chronic respiratory failure, chronic cardiac failure, chronic renal failure and was exclusive of separately billable procedures and treating other patients. Total critical care time spent by me: 40 mnutes Time spent personally by me on obtaining history from patient or surrogate, evaluation of the patient, evaluation of patient's response to treatment, ordering and review of laboratory studies, ordering and review of radiographic studies, ordering and performing treatments and interventions, and re-evaluation of the patient's condition.   Consultants:  Orthopedic ID will be consulted  Subjective:  Complains of left ankle pain, afebrile overnight, her right IJ central line has been discontinued yesterday  Objective: Vitals:   12/15/23 0819 12/15/23 1025 12/15/23 1045 12/15/23 1105  BP: (!) 127/57 115/87 (!) 109/99 (!) 131/57  Pulse: 83 79 84   Resp: 19 15 15 17   Temp: 98.1 F (36.7 C) 98.5 F (36.9 C) 99.8 F (37.7 C) 98.3 F (36.8 C)  TempSrc: Oral Oral Axillary Oral  SpO2: 92% 93% 98%   Weight:      Height:        Intake/Output Summary  (Last 24 hours) at 12/15/2023 1147 Last data filed at 12/15/2023 1025 Gross per 24 hour  Intake 743.5 ml  Output 900 ml  Net -156.5 ml   Filed Weights   12/14/23 0100 12/14/23 0200  Weight: 103.3 kg 98 kg    Examination:  Awake Alert, Oriented X 3, frail, anxious, intermittently tearful Symmetrical Chest wall movement, Good air movement bilaterally, CTAB RRR,No Gallops,Rubs or new Murmurs, No Parasternal Heave +ve B.Sounds, Abd Soft, bilateral CVA tenderness Left foot/ankle bandaged   Data Reviewed: I have personally reviewed following labs and imaging studies  CBC: Recent Labs  Lab 12/14/23 0214 12/15/23 0253  WBC 19.6* 14.7*  HGB 7.2* 7.5*  HCT 23.5* 23.9*  MCV 88.3 87.5  PLT 265 255    Basic Metabolic Panel: Recent Labs  Lab 12/14/23 0214 12/15/23 0253  NA 134* 133*  K 4.3 4.4  CL 98 101  CO2 22 21*  GLUCOSE 137* 134*  BUN 50* 54*  CREATININE 2.25* 2.28*  CALCIUM  8.4* 8.1*  MG 2.1  --     GFR: Estimated Creatinine Clearance: 30.4 mL/min (A) (by C-G formula based on SCr of 2.28 mg/dL (H)).  Liver Function Tests: Recent Labs  Lab 12/14/23 0214  AST 11*  ALT 11  ALKPHOS 144*  BILITOT 0.6  PROT 6.5  ALBUMIN  2.1*    CBG: Recent Labs  Lab 12/14/23 1947 12/15/23 0016 12/15/23 0508 12/15/23 0754 12/15/23  1141  GLUCAP 174* 153* 120* 114* 170*     Recent Results (from the past 240 hours)  Urine Culture (for pregnant, neutropenic or urologic patients or patients with an indwelling urinary catheter)     Status: Abnormal   Collection Time: 12/14/23  1:56 AM   Specimen: Urine, Clean Catch  Result Value Ref Range Status   Specimen Description URINE, CLEAN CATCH  Final   Special Requests NONE  Final   Culture (A)  Final    <10,000 COLONIES/mL INSIGNIFICANT GROWTH Performed at [facility], [address], [place], [place] [address]    Report Status 12/15/2023 FINAL  Final  Culture, blood (x 2)     Status: None (Preliminary result)    Collection Time: 12/14/23  2:14 AM   Specimen: BLOOD RIGHT ARM  Result Value Ref Range Status   Specimen Description BLOOD RIGHT ARM  Final   Special Requests   Final    BOTTLES DRAWN AEROBIC AND ANAEROBIC Blood Culture adequate volume   Culture   Final    NO GROWTH 1 DAY Performed at [facility], [address], [place], [place] [address]    Report Status PENDING  Incomplete  Culture, blood (x 2)     Status: None (Preliminary result)   Collection Time: 12/14/23  2:14 AM   Specimen: BLOOD RIGHT HAND  Result Value Ref Range Status   Specimen Description BLOOD RIGHT HAND  Final   Special Requests   Final    BOTTLES DRAWN AEROBIC AND ANAEROBIC Blood Culture adequate volume   Culture   Final    NO GROWTH 1 DAY Performed at [facility], [address], [place], [place] [address]    Report Status PENDING  Incomplete         Radiology Studies: US  PELVIS (TRANSABDOMINAL ONLY) Result Date: 12/14/2023 CLINICAL DATA:  Pelvic inflammatory disease EXAM: TRANSABDOMINAL ULTRASOUND OF PELVIS TECHNIQUE: Transabdominal ultrasound examination of the pelvis was performed including evaluation of the uterus, ovaries, adnexal regions, and pelvic cul-de-sac. COMPARISON:  CT 12/14/2023 FINDINGS: Uterus Absent Right ovary Not visualized.  No definite adnexal mass identified. Left ovary Not visualized.  No definite adnexal mass identified. Other findings: Trace simple appearing free fluid within the right adnexa best seen on cine images. IMPRESSION: 1. Status post hysterectomy. 2. Nonvisualization of the ovaries bilaterally. If indicated, pelvic MRI examination with contrast may be helpful for further evaluation. 3. Trace simple appearing free fluid within the right adnexa, nonspecific. Electronically Signed   By: Worthy Heads M.D.   On: 12/14/2023 19:23   CT RENAL STONE STUDY Result Date: 12/14/2023 CLINICAL DATA:  Sepsis with bilateral pyelonephritis. Evaluate for hydronephrosis. EXAM: CT  ABDOMEN AND PELVIS WITHOUT CONTRAST TECHNIQUE: Multidetector CT imaging of the abdomen and pelvis was performed following the standard protocol without IV contrast. RADIATION DOSE REDUCTION: This exam was performed according to the departmental dose-optimization program which includes automated exposure control, adjustment of the mA and/or kV according to patient size and/or use of iterative reconstruction technique. COMPARISON:  CT abdomen and pelvis 12/13/2023. FINDINGS: Lower chest: There is atelectasis in the lung bases. There are trace bilateral pleural effusions. The heart is enlarged. Hepatobiliary: No focal liver abnormality is seen. Status post cholecystectomy. No biliary dilatation. Pancreas: Unremarkable. No pancreatic ductal dilatation or surrounding inflammatory changes. Spleen: Mildly enlarged. Adrenals/Urinary Tract: Adrenal glands are unremarkable. Kidneys are normal, without renal calculi, focal lesion, or hydronephrosis. There is mild nonspecific bilateral perinephric fat stranding, mildly  increased. Bladder is unremarkable. Stomach/Bowel: Stomach is within normal limits. Appendix is not seen. No evidence of bowel wall thickening, distention, or inflammatory changes. Vascular/Lymphatic: Aortic atherosclerosis. No enlarged abdominal or pelvic lymph nodes. There are extensive vascular calcifications the abdomen and pelvis. Reproductive: Uterus is surgically absent. The left ovary appears enlarged with surrounding inflammatory stranding and fluid, a new finding. The right ovary appears within normal limits. Other: There is mild body wall edema which has increased. There is no ascites, but there is increasing presacral edema. Medication injection sites are seen in the subcutaneous tissues of the lower anterior abdominal wall. Musculoskeletal: No fracture is seen. IMPRESSION: 1. No hydronephrosis. 2. Mild nonspecific bilateral perinephric fat stranding, mildly increased. Correlate clinically for  infection. 3. Enlarged left ovary with surrounding inflammatory stranding and fluid, a new finding. Findings may be related to ovarian torsion or infection. Recommend further evaluation with pelvic ultrasound. 4. Increasing body wall edema and presacral edema. 5. Trace bilateral pleural effusions. 6. Cardiomegaly. 7. Mild splenomegaly. 8. Aortic atherosclerosis. Aortic Atherosclerosis (ICD10-I70.0). Electronically Signed   By: Tyron Gallon M.D.   On: 12/14/2023 16:54   DG Ankle Complete Left Result Date: 12/14/2023 CLINICAL DATA:  Surgical site infection. EXAM: LEFT ANKLE COMPLETE - 3 VIEW COMPARISON:  12/13/2023 FINDINGS: Postop changes with subtalar fusion. Bimalleolar fractures. Ankle mortise disruption. Regional soft tissue swelling. IMPRESSION: Postop changes. Disrupted ankle mortise. Displaced fractures. Soft tissue swelling. Electronically Signed   By: Sydell Eva M.D.   On: 12/14/2023 08:30   DG CHEST PORT 1 VIEW Result Date: 12/14/2023 CLINICAL DATA:  Surgical site infection.  CVC placement. EXAM: PORTABLE CHEST 1 VIEW COMPARISON:  12/13/2023. FINDINGS: Prominent cardiac silhouette. Pulmonary vascular congestion. No focal consolidation. No pneumothorax or pleural effusion. Right IJ CVC tip overlies distal SVC. IMPRESSION: Prominent cardiac silhouette and vascular congestion. Electronically Signed   By: Sydell Eva M.D.   On: 12/14/2023 08:28        Scheduled Meds:  allopurinol   50 mg Oral Daily   aspirin  EC  81 mg Oral Daily   Chlorhexidine  Gluconate Cloth  6 each Topical Daily   diphenhydrAMINE   25 mg Oral Once   escitalopram  10 mg Oral Daily   gabapentin   300 mg Oral TID   heparin  injection (subcutaneous)  5,000 Units Subcutaneous Q8H   insulin  aspart  0-9 Units Subcutaneous Q4H   insulin  glargine-yfgn  10 Units Subcutaneous Daily   latanoprost   1 drop Both Eyes QHS   pantoprazole   40 mg Oral Daily   povidone-iodine  2 Application Topical Once   sodium chloride  flush   3 mL Intravenous Q12H   Continuous Infusions:  piperacillin-tazobactam (ZOSYN)  IV 3.375 g (12/15/23 0526)   [START ON 12/16/2023] vancomycin        LOS: 1 day       Seena Dadds, MD [facility]   To contact the attending provider between 7A-7P or the covering provider during after hours 7P-7A, please log into the web site [email or web address] and access using universal [place] password for that web site. If you do not have the password, please call the hospital operator.  12/15/2023, 11:47 AM

## 2023-12-16 ENCOUNTER — Inpatient Hospital Stay (HOSPITAL_COMMUNITY)

## 2023-12-16 ENCOUNTER — Other Ambulatory Visit: Payer: Self-pay

## 2023-12-16 ENCOUNTER — Encounter (HOSPITAL_COMMUNITY): Payer: Self-pay | Admitting: Family Medicine

## 2023-12-16 DIAGNOSIS — B9562 Methicillin resistant Staphylococcus aureus infection as the cause of diseases classified elsewhere: Secondary | ICD-10-CM | POA: Diagnosis not present

## 2023-12-16 DIAGNOSIS — Z01810 Encounter for preprocedural cardiovascular examination: Secondary | ICD-10-CM

## 2023-12-16 DIAGNOSIS — B9620 Unspecified Escherichia coli [E. coli] as the cause of diseases classified elsewhere: Secondary | ICD-10-CM

## 2023-12-16 DIAGNOSIS — I2510 Atherosclerotic heart disease of native coronary artery without angina pectoris: Secondary | ICD-10-CM

## 2023-12-16 DIAGNOSIS — Z8639 Personal history of other endocrine, nutritional and metabolic disease: Secondary | ICD-10-CM

## 2023-12-16 DIAGNOSIS — T8149XA Infection following a procedure, other surgical site, initial encounter: Secondary | ICD-10-CM | POA: Diagnosis not present

## 2023-12-16 DIAGNOSIS — I2721 Secondary pulmonary arterial hypertension: Secondary | ICD-10-CM | POA: Diagnosis not present

## 2023-12-16 DIAGNOSIS — M86172 Other acute osteomyelitis, left ankle and foot: Secondary | ICD-10-CM | POA: Diagnosis not present

## 2023-12-16 DIAGNOSIS — N12 Tubulo-interstitial nephritis, not specified as acute or chronic: Secondary | ICD-10-CM

## 2023-12-16 DIAGNOSIS — R7881 Bacteremia: Secondary | ICD-10-CM | POA: Diagnosis not present

## 2023-12-16 LAB — BASIC METABOLIC PANEL WITH GFR
Anion gap: 10 (ref 5–15)
BUN: 55 mg/dL — ABNORMAL HIGH (ref 6–20)
CO2: 21 mmol/L — ABNORMAL LOW (ref 22–32)
Calcium: 8.1 mg/dL — ABNORMAL LOW (ref 8.9–10.3)
Chloride: 102 mmol/L (ref 98–111)
Creatinine, Ser: 2.16 mg/dL — ABNORMAL HIGH (ref 0.44–1.00)
GFR, Estimated: 26 mL/min — ABNORMAL LOW (ref >60)
Glucose, Bld: 111 mg/dL — ABNORMAL HIGH (ref 70–99)
Potassium: 4.3 mmol/L (ref 3.5–5.1)
Sodium: 133 mmol/L — ABNORMAL LOW (ref 135–145)

## 2023-12-16 LAB — BPAM RBC
Blood Product Expiration Date: 202506162359
ISSUE DATE / TIME: 202505311101
ISSUE DATE / TIME: 202506010800
ISSUE DATE / TIME: 202506172359
ISSUING PHYSICIAN: 202506010800
PRODUCT CODE: 202506011044
PRODUCT CODE: 202506172359
Unit Type and Rh: 202506172359
Unit Type and Rh: 600
Unit Type and Rh: 600
Unit Type and Rh: 600

## 2023-12-16 LAB — TYPE AND SCREEN
ABO/RH(D): A NEG
Antibody Screen: NEGATIVE
Unit division: 0
Unit division: 0
Unit division: 0

## 2023-12-16 LAB — CBC
HCT: 28.9 % — ABNORMAL LOW (ref 36.0–46.0)
Hemoglobin: 9.2 g/dL — ABNORMAL LOW (ref 12.0–15.0)
MCH: 27.1 pg (ref 26.0–34.0)
MCHC: 31.8 g/dL (ref 30.0–36.0)
MCV: 85 fL (ref 80.0–100.0)
Platelets: 250 10*3/uL (ref 150–400)
RBC: 3.4 MIL/uL — ABNORMAL LOW (ref 3.87–5.11)
RDW: 17 % — ABNORMAL HIGH (ref 11.5–15.5)
WBC: 11 10*3/uL — ABNORMAL HIGH (ref 4.0–10.5)
nRBC: 0 % (ref 0.0–0.2)

## 2023-12-16 LAB — GLUCOSE, CAPILLARY
Glucose-Capillary: 106 mg/dL — ABNORMAL HIGH (ref 70–99)
Glucose-Capillary: 122 mg/dL — ABNORMAL HIGH (ref 70–99)
Glucose-Capillary: 194 mg/dL — ABNORMAL HIGH (ref 70–99)
Glucose-Capillary: 214 mg/dL — ABNORMAL HIGH (ref 70–99)
Glucose-Capillary: 230 mg/dL — ABNORMAL HIGH (ref 70–99)

## 2023-12-16 MED ORDER — CEFAZOLIN SODIUM-DEXTROSE 1-4 GM/50ML-% IV SOLN
1.0000 g | Freq: Three times a day (TID) | INTRAVENOUS | Status: AC
  Administered 2023-12-16 – 2023-12-19 (×11): 1 g via INTRAVENOUS
  Filled 2023-12-16 (×17): qty 50

## 2023-12-16 MED ORDER — FUROSEMIDE 10 MG/ML IJ SOLN
40.0000 mg | Freq: Once | INTRAMUSCULAR | Status: AC
  Administered 2023-12-16: 40 mg via INTRAVENOUS
  Filled 2023-12-16: qty 4

## 2023-12-16 MED ORDER — FUROSEMIDE 10 MG/ML IJ SOLN
40.0000 mg | Freq: Two times a day (BID) | INTRAMUSCULAR | Status: DC
  Filled 2023-12-16: qty 4

## 2023-12-16 MED ORDER — HYDROMORPHONE HCL 1 MG/ML IJ SOLN
0.5000 mg | Freq: Once | INTRAMUSCULAR | Status: AC
  Administered 2023-12-16: 0.5 mg via INTRAVENOUS
  Filled 2023-12-16: qty 0.5

## 2023-12-16 MED ORDER — FUROSEMIDE 10 MG/ML IJ SOLN
40.0000 mg | Freq: Every day | INTRAMUSCULAR | Status: DC
  Administered 2023-12-16: 40 mg via INTRAVENOUS
  Filled 2023-12-16: qty 4

## 2023-12-16 MED ORDER — TECHNETIUM TO 99M ALBUMIN AGGREGATED
4.0000 | Freq: Once | INTRAVENOUS | Status: AC | PRN
  Administered 2023-12-16: 4 via INTRAVENOUS

## 2023-12-16 NOTE — Anesthesia Preprocedure Evaluation (Addendum)
 Anesthesia Evaluation  Patient identified by MRN, date of birth, ID band Patient awake    Reviewed: Allergy  & Precautions, H&P , NPO status , Patient's Chart, lab work & pertinent test results, reviewed documented beta blocker date and time   History of Anesthesia Complications Negative for: history of anesthetic complications  Airway Mallampati: II  TM Distance: >3 FB Neck ROM: Full    Dental no notable dental hx. (+) Teeth Intact, Dental Advisory Given   Pulmonary sleep apnea , COPD, former smoker   Pulmonary exam normal breath sounds clear to auscultation       Cardiovascular hypertension, Pt. on medications and Pt. on home beta blockers + CAD, + Past MI, + Cardiac Stents and +CHF   Rhythm:Regular Rate:Normal  Hx of mitraclip  IMPRESSIONS     1. Left ventricular ejection fraction, by estimation, is 40 to 45%. The  left ventricle has mildly decreased function. The left ventricle  demonstrates global hypokinesis. There is mild left ventricular  hypertrophy. Left ventricular diastolic function  could not be evaluated. There is the interventricular septum is flattened  in systole and diastole, consistent with right ventricular pressure and  volume overload.   2. Right ventricular systolic function is low normal. The right  ventricular size is enlarged. There is severely elevated pulmonary artery  systolic pressure. The estimated right ventricular systolic pressure is  65.7 mmHg.   3. Left atrial size was mildly dilated.   4. S/P mitral clip, mild to moderate MR, no valvular stenosis (MG 5 mmHg,  HR 72bpm). . The mean mitral valve gradient is 5.0 mmHg with average heart  rate of 72 bpm.   5. Tricuspid valve regurgitation is moderate.   6. The aortic valve is tricuspid. Aortic valve regurgitation is not  visualized. Aortic valve sclerosis is present, with no evidence of aortic  valve stenosis.   7. Pulmonic valve  regurgitation is moderate.   8. The inferior vena cava is dilated in size with <50% respiratory  variability, suggesting right atrial pressure of 15 mmHg.     Neuro/Psych neg Seizures PSYCHIATRIC DISORDERS Anxiety Depression    CVA    GI/Hepatic Neg liver ROS,GERD  Medicated,,  Endo/Other  diabetes, Poorly Controlled, Insulin  DependentHypothyroidism  Class 3 obesityA1c 10.2  Renal/GU Renal disease  negative genitourinary   Musculoskeletal  (+) Arthritis , Osteoarthritis,    Abdominal   Peds  Hematology  (+) Blood dyscrasia, anemia   Anesthesia Other Findings bacteremia  Reproductive/Obstetrics negative OB ROS                             Anesthesia Physical Anesthesia Plan  ASA: 3  Anesthesia Plan: MAC   Post-op Pain Management:    Induction: Intravenous  PONV Risk Score and Plan: Propofol  infusion and Treatment may vary due to age or medical condition  Airway Management Planned: Natural Airway  Additional Equipment:   Intra-op Plan:   Post-operative Plan:   Informed Consent: I have reviewed the patients History and Physical, chart, labs and discussed the procedure including the risks, benefits and alternatives for the proposed anesthesia with the patient or authorized representative who has indicated his/her understanding and acceptance.     Dental advisory given  Plan Discussed with: CRNA  Anesthesia Plan Comments:        Anesthesia Quick Evaluation

## 2023-12-16 NOTE — Consult Note (Addendum)
 Cardiology Consultation   Patient ID: [name] MRN: [id number]; DOB: 01-26-1961  Admit date: 12/14/2023 Date of Consult: 12/16/2023  PCP:  Mercy Stall, MD    HeartCare Providers Cardiologist:  Jules Oar, MD   Patient Profile: Dana [name] [name] is a 63 y.o. female with a hx of CAD status post multiple PCI's (RCA, LCx, LAD), last intervention DES to LAD from ISR 06/2024, heart failure with mildly reduced EF with CardioMEMS 2019, mTEER 2022, PAF status post ablation 2017 no longer on DOAC, PEA arrest 12/2020 in the setting of DKA felt related to respiratory arrest, PAD status post left great toe and fourth toe amputation, CKD stage IV, uncontrolled diabetes type 1, who is being seen 12/16/2023 for the evaluation of preop for L BKA at the request of Dr. Osborne Blazer.  History of Present Illness: Dana [name] [name] has extensive and complex cardiac history.  She is followed by advanced heart failure.  She has history of CAD/NSTEMIs with multiple stent placements 2022-2025 as detailed from office note.  In brief though she has had multiple stent placements to her LAD: 08/2022 DES to proximal LAD with previously overlapped stent 11/2022 NSTEMI with severe multivessel disease with 90% in-stent restenosis in the proximal midportion of previous LAD stent.  Treated with scoring balloon angioplasty 02/2023 severe ISR to proximal LAD stent with DES.  There was CTO of LCx beyond the stented segment. 06/2023 redemonstrated severe stenosis of the previous 2 layered proximal LAD stents.  Had staged intervention of LAD with scoring balloon angioplasty due to severe renal disease.  No new stent.  This was the most recent intervention.  Started on aspirin  and Brilinta   08/2023 she was seen for abnormal echocardiogram with wall motion abnormalities.  Given lack of symptoms and low concern for ACS did not undergo ischemic evaluation  10/2023 admitted for fall and sustained ankle fracture.  Cardiology asked  to see for preoperative evaluation  Additionally in terms of her heart failure with mildly reduced EF, this also has been complicated by severe renal disease with CKD stage IV.  She has nearly missed HD on multiple hospital admissions.  Currently patient being evaluated for multiple different issues that started at [facility] with sepsis initially felt related to bilateral pyelonephritis but also with left ankle postop infection with failed internal fixation with cellulitis and ulceration with plans of left BKA.  Also with bacteremia with MRSA.  Initially was on midodrine and had central line but this has since been discontinued.  Cardiology asked to see patient for preoperative evaluation as well as to facilitate TEE to evaluate for endocarditis.  Also has echocardiogram this admission that shows newly elevated RVSP 65.  VQ scan ordered to rule out PE, this was negative.  Brilinta  has been held with anticipation of procedure however this continues to be delayed.  Today patient is accompanied with her mother and her daughter.  She has no significant symptoms to offer today.  Denies any shortness of breath, chest pain, orthopnea, palpitations, peripheral edema, further falls, syncope.  Reports previously being ambulatory with a walker but now with her ankle issues with still walking but obviously limited.  But did not note any exertional symptoms.  She is very frustrated with everything that is going on and has been seen by psychiatry to help with this.  She reports she is very diligent about her heart issues and closely monitors everything including daily weights, compliant with all of her medications, monitoring her swelling and volume status on  daily basis.  Chest x-ray negative.  CT with questionable ovarian torsion versus infection.  Ultrasound of the pelvis showed stable hysterectomy, MRI recommended if indicated.  Not ordered.  Potassium 4.2.  Creatinine 2.16.  WBC 11.  Hemoglobin 9.2.   Procalcitonin 2 days ago 6.9.  Normal lactic acid.   Past Medical History:  Diagnosis Date   Acquired absence of left great toe (HCC)    Acquired absence of other left toe(s) (HCC)    ACS (acute coronary syndrome) (HCC)    Acute bronchitis due to COVID-19 virus 03/03/2023   Acute on chronic combined systolic and diastolic CHF (congestive heart failure) (HCC) 09/16/2023   Acute respiratory failure with hypoxia (HCC) 07/20/2023   Anemia    Anoxic brain injury (HCC)    Anxiety    CAD (coronary artery disease)    DES mLAD 04/07/15; DES dRCA & DES pPDA 08/30/16; DES mCX & PTCA OM1 09/29/20   Chronic diastolic (congestive) heart failure (HCC)    Chronic pain    Chronic systolic (congestive) heart failure (HCC)    CKD (chronic kidney disease)    Contrast dye induced nephropathy, possible 09/03/2020   COPD (chronic obstructive pulmonary disease) (HCC)    COVID-19 virus infection 03/03/2023   Demand ischemia (HCC)    Diabetes (HCC)type 1    Diastolic CHF (HCC)    Dyspnea    Encounter for screening mammogram for malignant neoplasm of breast 09/16/2023   Family history of breast cancer 10/23/2021   Gastro-esophageal reflux disease without esophagitis    Hyperlipidemia    Hypertension    Hypertensive heart disease with heart failure (HCC)    Hypoxemia    Idiopathic gout, multiple sites    Leukocytosis 03/29/2022   Localized edema    Low back pain    Mitral regurgitation    Mitral regurgitation    Mixed hyperlipidemia    Mixed incontinence    Morbid (severe) obesity due to excess calories (HCC)    Neuromuscular disorder (HCC)    Neuropathy    Non-ST elevation (NSTEMI) myocardial infarction (HCC)    08/29/20, 09/29/20   OSA (obstructive sleep apnea) 09/03/2020   Other specified hypothyroidism    PAF (paroxysmal atrial fibrillation) (HCC)    s/p pulmonary vein isolation by cryoablation 10/04/15 Dr. Jearldine Mina in [facility]   PEA (Pulseless electrical activity) [facility])    ~ 01/13/21 at [facility] during admission DKA, volume overload, possible CAP, hypoxia s/p ACLS with ROSC ~ 10-15   Pneumonia    Primary insomnia    Rheumatoid arthritis (HCC)    Stroke (HCC)    Thyroid  disease    Type 1 diabetes mellitus (HCC)    URI with cough and congestion 08/22/2023   Vitamin D  deficiency     Past Surgical History:  Procedure Laterality Date   ABDOMINAL HYSTERECTOMY     Partial- Still has both ovaries   ANKLE FUSION Left 11/13/2023   Procedure: HINDFOOT FUSION FIXATION OF ANKLE;  Surgeon: Laneta Pintos, MD;  Location: [facility] OR;  Service: Orthopedics;  Laterality: Left;   CARDIOVASCULAR STRESS TEST  08/13/2014   Nuclear; Normal   CARPAL TUNNEL RELEASE     CATARACT EXTRACTION, BILATERAL     CESAREAN SECTION     CHOLECYSTECTOMY     CORONARY ANGIOPLASTY WITH STENT PLACEMENT     3 blockages/ 1 stent   CORONARY BALLOON ANGIOPLASTY N/A 07/18/2023   Procedure: CORONARY BALLOON ANGIOPLASTY;  Surgeon: Odie Benne, MD;  Location: [facility] INVASIVE  CV LAB;  Service: Cardiovascular;  Laterality: N/A;   CORONARY PRESSURE/FFR STUDY N/A 09/07/2022   Procedure: INTRAVASCULAR PRESSURE WIRE/FFR STUDY;  Surgeon: Swaziland, Peter M, MD;  Location: [facility] INVASIVE CV LAB;  Service: Cardiovascular;  Laterality: N/A;   CORONARY STENT INTERVENTION N/A 09/29/2020   Procedure: CORONARY STENT INTERVENTION;  Surgeon: Arnoldo Lapping, MD;  Location: [facility] INVASIVE CV LAB;  Service: Cardiovascular;  Laterality: N/A;  CFX   CORONARY STENT INTERVENTION N/A 09/07/2022   Procedure: CORONARY STENT INTERVENTION;  Surgeon: Swaziland, Peter M, MD;  Location: [facility] INVASIVE CV LAB;  Service: Cardiovascular;  Laterality: N/A;   CORONARY STENT INTERVENTION N/A 02/13/2023   Procedure: CORONARY STENT INTERVENTION;  Surgeon: Odie Benne, MD;  Location: [facility] INVASIVE CV LAB;  Service: Cardiovascular;  Laterality: N/A;  LAD   CORONARY ULTRASOUND/IVUS N/A 02/13/2023   Procedure: Coronary Ultrasound/IVUS;  Surgeon: Odie Benne, MD;  Location: [facility] INVASIVE CV LAB;  Service: Cardiovascular;  Laterality: N/A;   CORONARY/GRAFT ACUTE MI REVASCULARIZATION N/A 12/08/2022   Procedure: Coronary/Graft Acute MI Revascularization;  Surgeon: Arleen Lacer, MD;  Location: [facility] INVASIVE CV LAB;  Service: Cardiovascular;  Laterality: N/A;   EYE SURGERY     LEFT HEART CATH AND CORONARY ANGIOGRAPHY N/A 09/29/2020   Procedure: LEFT HEART CATH AND CORONARY ANGIOGRAPHY;  Surgeon: Arnoldo Lapping, MD;  Location: [facility] INVASIVE CV LAB;  Service: Cardiovascular;  Laterality: N/A;   LEFT HEART CATH AND CORONARY ANGIOGRAPHY N/A 09/07/2022   Procedure: LEFT HEART CATH AND CORONARY ANGIOGRAPHY;  Surgeon: Swaziland, Peter M, MD;  Location: [facility] INVASIVE CV LAB;  Service: Cardiovascular;  Laterality: N/A;   LEFT HEART CATH AND CORONARY ANGIOGRAPHY N/A 12/08/2022   Procedure: LEFT HEART CATH AND CORONARY ANGIOGRAPHY;  Surgeon: Arleen Lacer, MD;  Location: [facility] INVASIVE CV LAB;  Service: Cardiovascular;  Laterality: N/A;   LEFT HEART CATH AND CORONARY ANGIOGRAPHY N/A 02/13/2023   Procedure: LEFT HEART CATH AND CORONARY ANGIOGRAPHY;  Surgeon: Odie Benne, MD;  Location: [facility] INVASIVE CV LAB;  Service: Cardiovascular;  Laterality: N/A;   LEFT HEART CATH AND CORONARY ANGIOGRAPHY N/A 07/16/2023   Procedure: LEFT HEART CATH AND CORONARY ANGIOGRAPHY;  Surgeon: Odie Benne, MD;  Location: [facility] INVASIVE CV LAB;  Service: Cardiovascular;  Laterality: N/A;   RIGHT HEART CATH N/A 04/27/2021   Procedure: RIGHT HEART CATH;  Surgeon: Mardell Shade, MD;  Location: [facility] INVASIVE CV LAB;  Service: Cardiovascular;  Laterality: N/A;   RIGHT/LEFT HEART CATH AND CORONARY ANGIOGRAPHY N/A 01/07/2017   Procedure: Right/Left Heart Cath and Coronary Angiography;  Surgeon: Darlis Eisenmenger, MD;  Location: [facility] INVASIVE CV LAB;  Service: Cardiovascular;  Laterality: N/A;   ROTATOR CUFF REPAIR     TEE WITHOUT CARDIOVERSION N/A 04/28/2021    Procedure: TRANSESOPHAGEAL ECHOCARDIOGRAM (TEE);  Surgeon: Mardell Shade, MD;  Location: [facility] ENDOSCOPY;  Service: Cardiovascular;  Laterality: N/A;   TEE WITHOUT CARDIOVERSION N/A 05/18/2021   Procedure: TRANSESOPHAGEAL ECHOCARDIOGRAM (TEE);  Surgeon: Thukkani, Arun K, MD;  Location: [facility] INVASIVE CV LAB;  Service: Cardiovascular;  Laterality: N/A;   TOE AMPUTATION Left    TRANSCATHETER MITRAL EDGE TO EDGE REPAIR N/A 05/18/2021   Procedure: MITRAL VALVE REPAIR;  Surgeon: Kyra Phy, MD;  Location: [facility] INVASIVE CV LAB;  Service: Cardiovascular;  Laterality: N/A;   US  ECHOCARDIOGRAPHY  05/2017   Normal     Scheduled Meds:  allopurinol   50 mg Oral Daily   aspirin  EC  81 mg Oral Daily   carvedilol   6.25 mg Oral BID WC   Chlorhexidine  Gluconate Cloth  6 each Topical Daily   diphenhydrAMINE   25 mg Oral Once   escitalopram  10 mg Oral Daily   gabapentin   300 mg Oral TID   heparin  injection (subcutaneous)  5,000 Units Subcutaneous Q8H   insulin  aspart  0-9 Units Subcutaneous Q4H   insulin  glargine-yfgn  10 Units Subcutaneous Daily   latanoprost   1 drop Both Eyes QHS   pantoprazole   40 mg Oral Daily   sodium chloride  flush  3 mL Intravenous Q12H   Continuous Infusions:   ceFAZolin  (ANCEF ) IV     vancomycin  1,250 mg (12/16/23 0541)   PRN Meds: acetaminophen  **OR** acetaminophen , HYDROmorphone  (DILAUDID ) injection, LORazepam , oxyCODONE , prochlorperazine , senna, sodium chloride  flush  Allergies:    Allergies  Allergen Reactions   Naproxen Hives and Rash   Shellfish-Derived Products Hives, Swelling and Other (See Comments)    Angioedema    Strawberry (Diagnostic) Hives   Celecoxib Other (See Comments)    Stomach pain   Doxepin  Hcl Other (See Comments)    Lethargic, sleeping a lot per pt and family   Glucosamine Hives, Swelling and Other (See Comments)    Angioedema    Iron  Hives and Other (See Comments)    IV iron  transfusion - hives - Feb 2022 hospitalization  Patient  said she CAN tolerate if given Benadryl  first   Metoclopramide Other (See Comments)    Facial twitching and stuttering   Metolazone  Other (See Comments)    Acute renal failure   Statins Other (See Comments)    Muscle pain    Social History:   Social History   Socioeconomic History   Marital status: Married    Spouse name: [name]   Number of children: 2   Years of education: 16   Highest education level: Master's degree (e.g., MA, MS, MEng, MEd, MSW, MBA)  Occupational History   Occupation: on disability/retired early former Engineer, site  Tobacco Use   Smoking status: Former    Current packs/day: 0.00    Types: Cigarettes    Start date: 6    Quit date: 1984    Years since quitting: 41.4    Passive exposure: Past   Smokeless tobacco: Never  Vaping Use   Vaping status: Never Used  Substance and Sexual Activity   Alcohol use: Never   Drug use: Never   Sexual activity: Not on file  Other Topics Concern   Not on file  Social History Narrative   Not on file   Social Drivers of Health   Financial Resource Strain: Low Risk  (10/24/2023)   Overall Financial Resource Strain (CARDIA)    Difficulty of Paying Living Expenses: Not hard at all  Food Insecurity: No Food Insecurity (12/14/2023)   Hunger Vital Sign    Worried About Running Out of Food in the Last Year: Never true    Ran Out of Food in the Last Year: Never true  Transportation Needs: No Transportation Needs (12/14/2023)   PRAPARE - Administrator, Civil Service (Medical): No    Lack of Transportation (Non-Medical): No  Physical Activity: Insufficiently Active (10/24/2023)   Exercise Vital Sign    Days of Exercise per Week: 3 days    Minutes of Exercise per Session: 30 min  Stress: Stress Concern Present (10/24/2023)   [employer] of Occupational Health - Occupational Stress Questionnaire    Feeling of Stress : To some extent  Social Connections: Moderately Integrated (12/14/2023)  Social  Connection and Isolation Panel [NHANES]    Frequency of Communication with Friends and Family: More than three times a week    Frequency of Social Gatherings with Friends and Family: Three times a week    Attends Religious Services: More than 4 times per year    Active Member of Clubs or Organizations: No    Attends Banker Meetings: Never    Marital Status: Married  Catering manager Violence: Not At Risk (12/14/2023)   Humiliation, Afraid, Rape, and Kick questionnaire    Fear of Current or Ex-Partner: No    Emotionally Abused: No    Physically Abused: No    Sexually Abused: No    Family History:   Family History  Problem Relation Age of Onset   Breast cancer Mother 3       contralateral breast ca dx 90   Coronary artery disease Mother    Hypertension Mother    Hyperlipidemia Mother    Diabetes type II Mother    Lung cancer Mother 28   Diabetes Father    Hypertension Father    Mental illness Sister    Bipolar disorder Other    Depression Other    Schizophrenia Other    Cancer Other        MGF's sisters x2; unknown type     ROS:  Please see the history of present illness.  All other ROS reviewed and negative.     Physical Exam/Data: Vitals:   12/16/23 0855 12/16/23 0919 12/16/23 0933 12/16/23 1217  BP: 133/89 (!) 136/50  (!) 135/50  Pulse: 73 70 69   Resp:  (!) 21 19   Temp: 98.4 F (36.9 C) 98.3 F (36.8 C)  97.7 F (36.5 C)  TempSrc: Oral Temporal  Oral  SpO2: 92% 95% 91%   Weight:      Height:        Intake/Output Summary (Last 24 hours) at 12/16/2023 1412 Last data filed at 12/16/2023 2956 Gross per 24 hour  Intake --  Output 1700 ml  Net -1700 ml      12/14/2023    2:00 AM 12/14/2023    1:00 AM 11/12/2023    4:16 AM  Last 3 Weights  Weight (lbs) 216 lb 0.8 oz 227 lb 11.8 oz 214 lb 0.4 oz  Weight (kg) 98 kg 103.3 kg 97.08 kg     Body mass index is 35.95 kg/m.  General:  Well nourished, well developed, in no acute distress HEENT:  normal Neck: no JVD Vascular: No carotid bruits; Distal pulses 2+ bilaterally Cardiac:  normal S1, S2; RRR; 2/6 murmur Lungs: Positive crackles  abd: soft, nontender, no hepatomegaly  Ext: no edema Musculoskeletal:  No deformities, BUE and BLE strength normal and equal Skin: warm and dry  Neuro:  CNs 2-12 intact, no focal abnormalities noted Psych:  Normal affect   EKG:  The EKG was personally reviewed and demonstrates: Sinus rhythm heart rates in the 60s.  Nonspecific T wave changes inferiorly. Telemetry:  Telemetry was personally reviewed and demonstrates: Sinus rhythm heart rates in the 60s to 70s.  Relevant CV Studies: - Echo 12/24: EF 55-60%, RV normal, MVg unchanged at 5 mmHg - LHC (1/25): PTCA to prox LAD - LHC (12/24): multivessel CAD. Proximal LAD restenosis - LHC (8/24):  PTCA/DES x 1 proximal LAD, chronic occlusion mid Circ beyond the stented segment, patent obtuse marginal branch with severe distal stenosis (too small for PCI), dominant RCA with moderate proximal stenosis,  patent distal stent, LVEDP 19 mmhg. - Echo (5/24): EF 50-55%. RV mildly reduced, post mitral clip x 2 with XTW and NTW the latter entangled in chords, no obvious SLD noted, mean gradient 5 mmHg, mild residual MR appears medial to the two clips. - LHC (5/24): DES to LAD, osial prox RCA with severe 85% stenosis-->will need staged intervention when renal function stabilized. - LHC (2/24): 2v obstructive CAD. New 90% stenosis pLAD with RFR 0.48. The stent in the LCx is occluded after a large first OM. Patent stent in the distal RCA. S/p PCI of pLAD with DES x 1 overlapping with prior stent. - Echo (1/24): EF 55-60%, normal RV and moderate MR, mG 7.5 mmHg, essentially unchanged from prior echo.   Left heart catheterization 07/16/2023 Severe restenosis in the proximal LAD stent. There are two layers of stent in this segment (placed in February 2024 and July 2024). The distal LAD is small in caliber.  Chronic  occlusion mid Circumflex beyond the stented segment. Patent obtuse marginal branch with severe diffuse distal stenosis (too small for PCI) Moderate caliber dominant RCA with moderate ostial/proximal stenosis, patent distal stent.  LVEDP=15 mmHg   Recommendations: She will need PCI of the proximal LAD stented segment. This PCI will be staged given her advanced kidney disease. Continue IV NTG.   Staged intervention 07/18/2023  Mid LAD to Dist LAD lesion is 60% stenosed.   Mid LAD lesion is 90% stenosed.   Mid Cx to Dist Cx lesion is 100% stenosed.   1st Mrg-1 lesion is 30% stenosed.   1st Mrg-2 lesion is 90% stenosed.   Non-stenotic Mid Cx lesion was previously treated.   Scoring balloon angioplasty was performed using a BALLN SCOREFLEX 3.0X10.   Post intervention, there is a 10% residual stenosis.   Severe restenosis of a focal segment of the proximal LAD stented segment.  Successful scoring balloon angioplasty of the proximal LAD stented segment.    Recommendations: She did well with her procedure. This will most likely not be a long lasting result. In July 2024 IVUS was performed and the stents are all well expanded in the proximal LAD. Scoring balloon angioplasty today alone. No new stents placed. Continue current medical therapy including ASA and Brilinta . Gentle hydration overnight and hopeful d/c home tomorrow.     Echocardiogram 08/27/2023 1. LVEF mildly reduced. Severe hypokinesis of the basal to mid posterior  segments and basal inferior segment. Left ventricular ejection fraction,  by estimation, is 40 to 45%. The left ventricle has mildly decreased  function. The left ventricle  demonstrates regional wall motion abnormalities (see scoring  diagram/findings for description). Left ventricular diastolic function  could not be evaluated.   2. Right ventricular systolic function was not well visualized. The right  ventricular size is not well visualized. Tricuspid regurgitation  signal is  inadequate for assessing PA pressure.   3. Left atrial size was mildly dilated.   4. S/p mitraclip. Mild residual MR. The mitral valve has been  repaired/replaced. Mild mitral valve regurgitation. No evidence of mitral  stenosis. The mean mitral valve gradient is 4.3 mmHg with average heart  rate of 69 bpm.   5. The aortic valve is grossly normal. Aortic valve regurgitation is not  visualized. No aortic stenosis is present.   6. The inferior vena cava is normal in size with <50% respiratory  variability, suggesting right atrial pressure of 8 mmHg.   Comparison(s): Changes from prior study are noted. LVEF now mildly reduced  with  new WMA in the posterior/inferior segments as detailed above.   Echocardiogram 12/15/2023 1. Left ventricular ejection fraction, by estimation, is 40 to 45%. The  left ventricle has mildly decreased function. The left ventricle  demonstrates global hypokinesis. There is mild left ventricular  hypertrophy. Left ventricular diastolic function  could not be evaluated. There is the interventricular septum is flattened  in systole and diastole, consistent with right ventricular pressure and  volume overload.   2. Right ventricular systolic function is low normal. The right  ventricular size is enlarged. There is severely elevated pulmonary artery  systolic pressure. The estimated right ventricular systolic pressure is  65.7 mmHg.   3. Left atrial size was mildly dilated.   4. S/P mitral clip, mild to moderate MR, no valvular stenosis (MG 5 mmHg,  HR 72bpm). . The mean mitral valve gradient is 5.0 mmHg with average heart  rate of 72 bpm.   5. Tricuspid valve regurgitation is moderate.   6. The aortic valve is tricuspid. Aortic valve regurgitation is not  visualized. Aortic valve sclerosis is present, with no evidence of aortic  valve stenosis.   7. Pulmonic valve regurgitation is moderate.   8. The inferior vena cava is dilated in size with <50%  respiratory  variability, suggesting right atrial pressure of 15 mmHg.   Comparison(s): A prior study was performed on 08/27/2023. Right  ventricular size is now enlarged, RVSP suggestive of severely elevated  pulmonary pressures, interventricular septum is flattened in systole and  diastole, consistent with right ventricular  pressure and volume overload, RAP is now .   Conclusion(s)/Recommendation(s): No evidence of valvular vegetations on  this transthoracic echocardiogram. Consider a transesophageal  echocardiogram to exclude infective endocarditis if clinically indicated.  Given the dilated RV, elevated RVSP, and low  normal systolic function, rule out pulmonary embolism, if clinically  indicated.  Laboratory Data: High Sensitivity Troponin:  No results for input(s): "TROPONINIHS" in the last 720 hours.   Chemistry Recent Labs  Lab 12/14/23 0214 12/15/23 0253 12/16/23 0315  NA 134* 133* 133*  K 4.3 4.4 4.3  CL 98 101 102  CO2 22 21* 21*  GLUCOSE 137* 134* 111*  BUN 50* 54* 55*  CREATININE 2.25* 2.28* 2.16*  CALCIUM  8.4* 8.1* 8.1*  MG 2.1  --   --   GFRNONAA 24* 24* 26*  ANIONGAP 14 11 10     Recent Labs  Lab 12/14/23 0214  PROT 6.5  ALBUMIN  2.1*  AST 11*  ALT 11  ALKPHOS 144*  BILITOT 0.6   Lipids No results for input(s): "CHOL", "TRIG", "HDL", "LABVLDL", "LDLCALC", "CHOLHDL" in the last 168 hours.  Hematology Recent Labs  Lab 12/14/23 0214 12/15/23 0253 12/16/23 0315  WBC 19.6* 14.7* 11.0*  RBC 2.66* 2.73* 3.40*  HGB 7.2* 7.5* 9.2*  HCT 23.5* 23.9* 28.9*  MCV 88.3 87.5 85.0  MCH 27.1 27.5 27.1  MCHC 30.6 31.4 31.8  RDW 16.0* 15.5 17.0*  PLT 265 255 250   Thyroid  No results for input(s): "TSH", "FREET4" in the last 168 hours.  BNPNo results for input(s): "BNP", "PROBNP" in the last 168 hours.  DDimer No results for input(s): "DDIMER" in the last 168 hours.  Radiology/Studies:  NM Pulmonary Perfusion Result Date: 12/16/2023 CLINICAL DATA:   right heart failure, recent surgery, EXAM: NUCLEAR MEDICINE PERFUSION LUNG SCAN TECHNIQUE: Perfusion images were obtained in multiple projections after intravenous injection of radiopharmaceutical. No ventilation imaging performed. RADIOPHARMACEUTICALS:  4.0 mCi Tc-85m MAA IV COMPARISON:  Chest radiographs 12/16/2023  and 12/14/2023. Nuclear medicine perfusion scans 08/27/2023 and 11/01/2016. FINDINGS: Mildly heterogeneous left perihilar perfusion, similar to previous lung scan. No new or wedge-shaped perfusion defects are demonstrated to suggest acute pulmonary embolism. The right lung perfusion appears normal. IMPRESSION: No evidence of acute pulmonary embolism on perfusion scintigraphy by PISAPED criteria. Mildly heterogeneous left perihilar perfusion, similar to previous examination. Electronically Signed   By: Elmon Hagedorn M.D.   On: 12/16/2023 13:06   DG Chest 1 View Result Date: 12/16/2023 CLINICAL DATA:  For comparison with the 2 scan EXAM: CHEST  1 VIEW COMPARISON:  12/14/2023 FINDINGS: Cardiomegaly. No confluent airspace opacities, effusions or overt edema. No acute bony abnormality. IMPRESSION: Stable cardiomegaly.  No active disease. Electronically Signed   By: Janeece Mechanic M.D.   On: 12/16/2023 12:19   ECHOCARDIOGRAM COMPLETE Result Date: 12/15/2023    ECHOCARDIOGRAM REPORT   Patient Name:   Dana [name] [name] Date of Exam: 12/15/2023 Medical Rec #:  [id number]      Height:       65.0 in Accession #:    [id number]     Weight:       216.0 lb Date of Birth:  12-21-1960      BSA:          2.044 m Patient Age:    62 years       BP:           134/52 mmHg Patient Gender: F              HR:           76 bpm. Exam Location:  Inpatient Procedure: 2D Echo, Cardiac Doppler and Color Doppler (Both Spectral and Color            Flow Doppler were utilized during procedure). Indications:    Bacteremia  History:        Patient has prior history of Echocardiogram examinations, most                 recent 08/27/2023.   Sonographer:    Andrena Bang Referring Phys: [id number] TIMOTHY S OPYD  Sonographer Comments: Technically difficult study due to poor echo windows, suboptimal parasternal window and suboptimal subcostal window. IMPRESSIONS  1. Left ventricular ejection fraction, by estimation, is 40 to 45%. The left ventricle has mildly decreased function. The left ventricle demonstrates global hypokinesis. There is mild left ventricular hypertrophy. Left ventricular diastolic function could not be evaluated. There is the interventricular septum is flattened in systole and diastole, consistent with right ventricular pressure and volume overload.  2. Right ventricular systolic function is low normal. The right ventricular size is enlarged. There is severely elevated pulmonary artery systolic pressure. The estimated right ventricular systolic pressure is 65.7 mmHg.  3. Left atrial size was mildly dilated.  4. S/P mitral clip, mild to moderate MR, no valvular stenosis (MG 5 mmHg, HR 72bpm). . The mean mitral valve gradient is 5.0 mmHg with average heart rate of 72 bpm.  5. Tricuspid valve regurgitation is moderate.  6. The aortic valve is tricuspid. Aortic valve regurgitation is not visualized. Aortic valve sclerosis is present, with no evidence of aortic valve stenosis.  7. Pulmonic valve regurgitation is moderate.  8. The inferior vena cava is dilated in size with <50% respiratory variability, suggesting right atrial pressure of 15 mmHg. Comparison(s): A prior study was performed on 08/27/2023. Right ventricular size is now enlarged, RVSP suggestive of severely elevated pulmonary pressures, interventricular septum is flattened in systole  and diastole, consistent with right ventricular pressure and volume overload, RAP is now . Conclusion(s)/Recommendation(s): No evidence of valvular vegetations on this transthoracic echocardiogram. Consider a transesophageal echocardiogram to exclude infective endocarditis if clinically indicated.  Given the dilated RV, elevated RVSP, and low normal systolic function, rule out pulmonary embolism, if clinically indicated. FINDINGS  Left Ventricle: Left ventricular ejection fraction, by estimation, is 40 to 45%. The left ventricle has mildly decreased function. The left ventricle demonstrates global hypokinesis. Definity  contrast agent was given IV to delineate the left ventricular  endocardial borders. The left ventricular internal cavity size was normal in size. There is mild left ventricular hypertrophy. The interventricular septum is flattened in systole and diastole, consistent with right ventricular pressure and volume overload. Left ventricular diastolic function could not be evaluated due to mitral valve repair. Left ventricular diastolic function could not be evaluated. Right Ventricle: The right ventricular size is enlarged. Right vetricular wall thickness was not well visualized. Right ventricular systolic function is low normal. There is severely elevated pulmonary artery systolic pressure. The tricuspid regurgitant velocity is 3.56 m/s, and with an assumed right atrial pressure of 15 mmHg, the estimated right ventricular systolic pressure is 65.7 mmHg. Left Atrium: Left atrial size was mildly dilated. Right Atrium: Right atrial size was not well visualized. Pericardium: There is no evidence of pericardial effusion. Mitral Valve: S/P mitral clip, mild to moderate MR, no valvular stenosis (MG 5 mmHg, HR 72bpm). MV peak gradient, 16.8 mmHg. The mean mitral valve gradient is 5.0 mmHg with average heart rate of 72 bpm. Tricuspid Valve: The tricuspid valve is normal in structure. Tricuspid valve regurgitation is moderate . No evidence of tricuspid stenosis. Aortic Valve: The aortic valve is tricuspid. Aortic valve regurgitation is not visualized. Aortic valve sclerosis is present, with no evidence of aortic valve stenosis. Aortic valve mean gradient measures 3.0 mmHg. Aortic valve peak gradient measures  6.0  mmHg. Aortic valve area, by VTI measures 1.81 cm. Pulmonic Valve: The pulmonic valve was normal in structure. Pulmonic valve regurgitation is moderate. No evidence of pulmonic stenosis. Aorta: The aortic root and ascending aorta are structurally normal, with no evidence of dilitation. Venous: The inferior vena cava is dilated in size with less than 50% respiratory variability, suggesting right atrial pressure of 15 mmHg. IAS/Shunts: The interatrial septum was not well visualized.  LEFT VENTRICLE PLAX 2D LVIDd:         5.10 cm      Diastology LVIDs:         3.50 cm      LV e' medial: 3.26 cm/s LV PW:         1.10 cm LV IVS:        1.10 cm LVOT diam:     1.80 cm LV SV:         42 LV SV Index:   20 LVOT Area:     2.54 cm  LV Volumes (MOD) LV vol d, MOD A2C: 196.0 ml LV vol d, MOD A4C: 210.0 ml LV vol s, MOD A2C: 99.5 ml LV vol s, MOD A4C: 105.0 ml LV SV MOD A2C:     96.5 ml LV SV MOD A4C:     210.0 ml LV SV MOD BP:      104.4 ml RIGHT VENTRICLE RV S prime:     9.14 cm/s TAPSE (M-mode): 1.7 cm LEFT ATRIUM             Index LA Vol (A2C):   77.5 ml  37.91 ml/m LA Vol (A4C):   62.3 ml 30.48 ml/m LA Biplane Vol: 73.9 ml 36.15 ml/m  AORTIC VALVE AV Area (Vmax):    1.76 cm AV Area (Vmean):   1.74 cm AV Area (VTI):     1.81 cm AV Vmax:           122.00 cm/s AV Vmean:          72.300 cm/s AV VTI:            0.231 m AV Peak Grad:      6.0 mmHg AV Mean Grad:      3.0 mmHg LVOT Vmax:         84.20 cm/s LVOT Vmean:        49.300 cm/s LVOT VTI:          0.164 m LVOT/AV VTI ratio: 0.71  AORTA Ao Sinus diam: 2.90 cm Ao Asc diam:   3.10 cm MITRAL VALVE              TRICUSPID VALVE MV Area VTI:  0.82 cm    TR Peak grad:   50.7 mmHg MV Peak grad: 16.8 mmHg   TR Vmax:        356.00 cm/s MV Mean grad: 5.0 mmHg MV Vmax:      2.05 m/s    SHUNTS MV Vmean:     99.4 cm/s   Systemic VTI:  0.16 m MR Peak grad: 101.6 mmHg  Systemic Diam: 1.80 cm MR Mean grad: 65.0 mmHg MR Vmax:      504.00 cm/s MR Vmean:     378.0 cm/s Sunit Tolia  Electronically signed by Olinda Bertrand Signature Date/Time: 12/15/2023/8:02:30 PM    Final    VAS US  LOWER EXTREMITY VENOUS (DVT) Result Date: 12/15/2023  Lower Venous DVT Study Patient Name:  Dana [name] [name]  Date of Exam:   12/15/2023 Medical Rec #: [id number]       Accession #:    [id number] Date of Birth: 1961/02/17       Patient Gender: F Patient Age:   72 years Exam Location:  [facility] Procedure:      VAS US  LOWER EXTREMITY VENOUS (DVT) Referring Phys: DAWOOD ELGERGAWY --------------------------------------------------------------------------------  Indications: Edema.  Risk Factors: None identified. Limitations: Poor ultrasound/tissue interface and patient pain tolerance. Comparison Study: No prior studies. Performing Technologist: Lerry Ransom RVT  Examination Guidelines: A complete evaluation includes B-mode imaging, spectral Doppler, color Doppler, and power Doppler as needed of all accessible portions of each vessel. Bilateral testing is considered an integral part of a complete examination. Limited examinations for reoccurring indications may be performed as noted. The reflux portion of the exam is performed with the patient in reverse Trendelenburg.  +---------+---------------+---------+-----------+----------+--------------+ RIGHT    CompressibilityPhasicitySpontaneityPropertiesThrombus Aging +---------+---------------+---------+-----------+----------+--------------+ CFV      Full           Yes      Yes                                 +---------+---------------+---------+-----------+----------+--------------+ SFJ      Full                                                        +---------+---------------+---------+-----------+----------+--------------+ FV Prox  Full                                                        +---------+---------------+---------+-----------+----------+--------------+  FV Mid   Full                                                         +---------+---------------+---------+-----------+----------+--------------+ FV Distal               Yes      Yes                                 +---------+---------------+---------+-----------+----------+--------------+ PFV      Full                                                        +---------+---------------+---------+-----------+----------+--------------+ POP      Full           Yes      Yes                                 +---------+---------------+---------+-----------+----------+--------------+ PTV      Full                                                        +---------+---------------+---------+-----------+----------+--------------+ PERO     Full                                                        +---------+---------------+---------+-----------+----------+--------------+   +---------+---------------+---------+-----------+----------+-------------------+ LEFT     CompressibilityPhasicitySpontaneityPropertiesThrombus Aging      +---------+---------------+---------+-----------+----------+-------------------+ CFV      Full           Yes      Yes                                      +---------+---------------+---------+-----------+----------+-------------------+ SFJ      Full                                                             +---------+---------------+---------+-----------+----------+-------------------+ FV Prox  Full                                                             +---------+---------------+---------+-----------+----------+-------------------+ FV Mid                  Yes      Yes                                      +---------+---------------+---------+-----------+----------+-------------------+  FV Distal               Yes      Yes                                      +---------+---------------+---------+-----------+----------+-------------------+ PFV      Full                                                              +---------+---------------+---------+-----------+----------+-------------------+ POP      Full           Yes      Yes                                      +---------+---------------+---------+-----------+----------+-------------------+ PTV      Full                                                             +---------+---------------+---------+-----------+----------+-------------------+ PERO                                                  Not well visualized +---------+---------------+---------+-----------+----------+-------------------+     Summary: RIGHT: - There is no evidence of deep vein thrombosis in the lower extremity. However, portions of this examination were limited- see technologist comments above.  - No cystic structure found in the popliteal fossa.  LEFT: - There is no evidence of deep vein thrombosis in the lower extremity. However, portions of this examination were limited- see technologist comments above.  - No cystic structure found in the popliteal fossa.  *See table(s) above for measurements and observations. Electronically signed by Jimmye Moulds MD on 12/15/2023 at 12:20:09 PM.    Final    US  PELVIS (TRANSABDOMINAL ONLY) Result Date: 12/14/2023 CLINICAL DATA:  Pelvic inflammatory disease EXAM: TRANSABDOMINAL ULTRASOUND OF PELVIS TECHNIQUE: Transabdominal ultrasound examination of the pelvis was performed including evaluation of the uterus, ovaries, adnexal regions, and pelvic cul-de-sac. COMPARISON:  CT 12/14/2023 FINDINGS: Uterus Absent Right ovary Not visualized.  No definite adnexal mass identified. Left ovary Not visualized.  No definite adnexal mass identified. Other findings: Trace simple appearing free fluid within the right adnexa best seen on cine images. IMPRESSION: 1. Status post hysterectomy. 2. Nonvisualization of the ovaries bilaterally. If indicated, pelvic MRI examination with contrast may be helpful for further evaluation. 3.  Trace simple appearing free fluid within the right adnexa, nonspecific. Electronically Signed   By: Worthy Heads M.D.   On: 12/14/2023 19:23   CT RENAL STONE STUDY Result Date: 12/14/2023 CLINICAL DATA:  Sepsis with bilateral pyelonephritis. Evaluate for hydronephrosis. EXAM: CT ABDOMEN AND PELVIS WITHOUT CONTRAST TECHNIQUE: Multidetector CT imaging of the abdomen and pelvis was performed following the standard protocol without IV contrast. RADIATION DOSE REDUCTION: This exam was performed according to  the departmental dose-optimization program which includes automated exposure control, adjustment of the mA and/or kV according to patient size and/or use of iterative reconstruction technique. COMPARISON:  CT abdomen and pelvis 12/13/2023. FINDINGS: Lower chest: There is atelectasis in the lung bases. There are trace bilateral pleural effusions. The heart is enlarged. Hepatobiliary: No focal liver abnormality is seen. Status post cholecystectomy. No biliary dilatation. Pancreas: Unremarkable. No pancreatic ductal dilatation or surrounding inflammatory changes. Spleen: Mildly enlarged. Adrenals/Urinary Tract: Adrenal glands are unremarkable. Kidneys are normal, without renal calculi, focal lesion, or hydronephrosis. There is mild nonspecific bilateral perinephric fat stranding, mildly increased. Bladder is unremarkable. Stomach/Bowel: Stomach is within normal limits. Appendix is not seen. No evidence of bowel wall thickening, distention, or inflammatory changes. Vascular/Lymphatic: Aortic atherosclerosis. No enlarged abdominal or pelvic lymph nodes. There are extensive vascular calcifications the abdomen and pelvis. Reproductive: Uterus is surgically absent. The left ovary appears enlarged with surrounding inflammatory stranding and fluid, a new finding. The right ovary appears within normal limits. Other: There is mild body wall edema which has increased. There is no ascites, but there is increasing presacral  edema. Medication injection sites are seen in the subcutaneous tissues of the lower anterior abdominal wall. Musculoskeletal: No fracture is seen. IMPRESSION: 1. No hydronephrosis. 2. Mild nonspecific bilateral perinephric fat stranding, mildly increased. Correlate clinically for infection. 3. Enlarged left ovary with surrounding inflammatory stranding and fluid, a new finding. Findings may be related to ovarian torsion or infection. Recommend further evaluation with pelvic ultrasound. 4. Increasing body wall edema and presacral edema. 5. Trace bilateral pleural effusions. 6. Cardiomegaly. 7. Mild splenomegaly. 8. Aortic atherosclerosis. Aortic Atherosclerosis (ICD10-I70.0). Electronically Signed   By: Tyron Gallon M.D.   On: 12/14/2023 16:54   DG Ankle Complete Left Result Date: 12/14/2023 CLINICAL DATA:  Surgical site infection. EXAM: LEFT ANKLE COMPLETE - 3 VIEW COMPARISON:  12/13/2023 FINDINGS: Postop changes with subtalar fusion. Bimalleolar fractures. Ankle mortise disruption. Regional soft tissue swelling. IMPRESSION: Postop changes. Disrupted ankle mortise. Displaced fractures. Soft tissue swelling. Electronically Signed   By: Sydell Eva M.D.   On: 12/14/2023 08:30   DG CHEST PORT 1 VIEW Result Date: 12/14/2023 CLINICAL DATA:  Surgical site infection.  CVC placement. EXAM: PORTABLE CHEST 1 VIEW COMPARISON:  12/13/2023. FINDINGS: Prominent cardiac silhouette. Pulmonary vascular congestion. No focal consolidation. No pneumothorax or pleural effusion. Right IJ CVC tip overlies distal SVC. IMPRESSION: Prominent cardiac silhouette and vascular congestion. Electronically Signed   By: Sydell Eva M.D.   On: 12/14/2023 08:28     Assessment and Plan:  Preoperative evaluation for left BKA Sepsis with MRSA bacteremia, left ankle infection Bilateral pyelonephritis Currently patient here with sepsis secondary to left ankle fracture/failed internal fixation with cellulitis and ulceration,  bilateral pyelonephritis, MRSA bacteremia.  She has extensive CAD with multiple stents/NSTEMI's/in-stent restenosis in LAD and multiple other cardiovascular history detailed below.  However in summary seems to have stable disease with no significant complaints.  Cannot assess METS given fracture. Her RCRI is 11% indicating she is high risk for any cardiovascular complications.  Also has significantly elevated RVSP which puts her at greater risk for anesthesia.  Will discuss with MD any other further recommendations. Need to outline plan for Brilinta  as this has been held for multiple days now with delayed procedure date.  She is approximately 6 months from her catheterization. Negative PE on VQ scan No vegetation noted on echocardiogram. However, holding on TEE at this time due to increased risk. Consent already placed, see  prior note today.  CAD with multiple PCI's Multiple ISR of LAD Last intervention was in January 2025 showing restented stenosis of previous 2 layered stent.  CTO of mid LCx, patent OM with small distal disease not amenable to PCI.  RCA with patent stent.  She had successful scoring balloon angioplasty of the pLAD only.  Report suggest not likely a long lasting result. Fortunately with no anginal complaints. Continue with aspirin .  Discussed with MD plan for Brilinta .  Continue carvedilol  6.25 mg twice daily On Repatha  at home.  LDL 28 5 months ago, excellent with LDL goal less than 55.  Chronic heart failure with mildly reduced EF Echo this admission with EF 40 to 45%, mild LVH, interventricular septum flattening.  Low normal RV function with enlarged RV and severely elevated RVSP 65.7.  As above does not seem consistent with volume (+ crackles though) and secondary etiology not clearly obvious.  But does seem well compensated and overall euvolemic.  She is around her baseline weight of 214 which is less than outpatient visit in April.  May consider getting Reds device but if right  sided heart failure may not be beneficial She was given 1 dose of IV Lasix  today No SGLT2 inhibitor with pyelonephritis Continue carvedilol  6.25 mg twice daily.  GDMT limited by renal disease.  Status post mTEER 2022 Failed clipping.  But has had stable mitral regurgitation.  Echocardiogram this admission mild to moderate MR.  Mean gradient 5.  Moderate TR.  Moderate PR.  Continue to follow outpatient  PAF status post ablation 2017 Maintaining NSR.  No documented recurrences and has not been on DOAC.  CKD stage IV High risk for disease progression.  Monitor renal function closely.  Uncontrolled insulin  dependent diabetes A1c 10.2% 1 month ago on insulin  per primary team.  Adjust this accordingly perioperatively.  OSA on CPAP Compliant    Risk Assessment/Risk Scores: [place]  Heart Association (NYHA) Functional Class NYHA Class III       For questions or updates, please contact [facility] Please consult [email or web address] for contact info under    Signed, Burnetta Cart, PA-C  12/16/2023 2:12 PM

## 2023-12-16 NOTE — Progress Notes (Signed)
 PT Cancellation Note  Patient Details Name: Dana [name] MRN: [id number] DOB: 08-08-1960   Cancelled Treatment:    Reason Eval/Treat Not Completed: Medical issues which prohibited therapy. Pt awaiting BKA this AM. PT will follow up after surgery is complete and pt is appropriate to mobilize.   Rexie Catena 12/16/2023, 7:56 AM

## 2023-12-16 NOTE — Progress Notes (Signed)
 Antimicrobial Stewardship Pharmacy Note  51 YOF transferred from [facility] with MRSA bacteremia likely seeded from L-ankle infection and concern for UTI/pyelo.   Obtained cultures from [place] and added to the media tab:   Urine culture obtained on 5/29 grew out E.coli - sensitive to all tested antimicrobials. Will narrow to Cefazolin .  Blood cultures obtained on 5/30 grew out MRSA - sensitive to tetracycline, clindamycin, vancomycin  (MIC 1), linezolid. Noted to be resistant to trimethoprim /sulfamethoxazole .  TEE delayed today due to concerns for PE however VQ scan negative. Tentatively planned for 6/3 with OR planned for L-BKA on 6/4.  Renal function improving - will likely need a Vancomycin  dose adjustment on 6/3 - will monitor closely.  Plan - D/c Zosyn and narrow to Cefazolin  1g IV every 8 hours - Continue Vancomycin  1250 mg IV every 48 hours (dose received this AM) - will consider adjustment 6/3 - Will continue to monitor work-up and LOT plans  Thank you for allowing pharmacy to be a part of this patient's care.  Garland Junk, PharmD, BCPS, BCIDP Infectious Diseases Clinical Pharmacist 12/16/2023 2:18 PM   **Pharmacist phone directory can now be found on [email or web address] ([email or web address]).  Listed under [facility].

## 2023-12-16 NOTE — Plan of Care (Signed)
  Problem: Education: Goal: Knowledge of General Education information will improve Description: Including pain rating scale, medication(s)/side effects and non-pharmacologic comfort measures Outcome: Not Progressing   Problem: Health Behavior/Discharge Planning: Goal: Ability to manage health-related needs will improve Outcome: Not Progressing   Problem: Clinical Measurements: Goal: Ability to maintain clinical measurements within normal limits will improve Outcome: Not Progressing Goal: Will remain free from infection Outcome: Not Progressing Goal: Diagnostic test results will improve Outcome: Not Progressing Goal: Respiratory complications will improve Outcome: Not Progressing Goal: Cardiovascular complication will be avoided Outcome: Not Progressing   Problem: Activity: Goal: Risk for activity intolerance will decrease Outcome: Not Progressing   Problem: Nutrition: Goal: Adequate nutrition will be maintained Outcome: Not Progressing   Problem: Coping: Goal: Level of anxiety will decrease Outcome: Not Progressing   Problem: Elimination: Goal: Will not experience complications related to bowel motility Outcome: Not Progressing Goal: Will not experience complications related to urinary retention Outcome: Not Progressing   Problem: Pain Managment: Goal: General experience of comfort will improve and/or be controlled Outcome: Not Progressing   Problem: Safety: Goal: Ability to remain free from injury will improve Outcome: Not Progressing   Problem: Skin Integrity: Goal: Risk for impaired skin integrity will decrease Outcome: Not Progressing   Problem: Education: Goal: Ability to describe self-care measures that may prevent or decrease complications (Diabetes Survival Skills Education) will improve Outcome: Not Progressing Goal: Individualized Educational Video(s) Outcome: Not Progressing   Problem: Coping: Goal: Ability to adjust to condition or change in  health will improve Outcome: Not Progressing   Problem: Fluid Volume: Goal: Ability to maintain a balanced intake and output will improve Outcome: Not Progressing   Problem: Health Behavior/Discharge Planning: Goal: Ability to identify and utilize available resources and services will improve Outcome: Not Progressing Goal: Ability to manage health-related needs will improve Outcome: Not Progressing   Problem: Metabolic: Goal: Ability to maintain appropriate glucose levels will improve Outcome: Not Progressing   Problem: Nutritional: Goal: Maintenance of adequate nutrition will improve Outcome: Not Progressing Goal: Progress toward achieving an optimal weight will improve Outcome: Not Progressing   Problem: Skin Integrity: Goal: Risk for impaired skin integrity will decrease Outcome: Not Progressing   Problem: Tissue Perfusion: Goal: Adequacy of tissue perfusion will improve Outcome: Not Progressing   Problem: Fluid Volume: Goal: Hemodynamic stability will improve Outcome: Not Progressing   Problem: Clinical Measurements: Goal: Diagnostic test results will improve Outcome: Not Progressing Goal: Signs and symptoms of infection will decrease Outcome: Not Progressing   Problem: Respiratory: Goal: Ability to maintain adequate ventilation will improve Outcome: Not Progressing

## 2023-12-16 NOTE — H&P (View-Only) (Signed)
 Patient ID: Dana [name], female   DOB: 1961/04/03, 63 y.o.   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  Patient's hemoglobin has improved to 9.2 after 3 units of packed red blood cells.  With IV antibiotics her white cell count has decreased from approximately 20-11.  Possible TEE today.  Plan for left below-knee amputation on Wednesday.  Most recent hemoglobin A1c 10.2.

## 2023-12-16 NOTE — Telephone Encounter (Signed)
 Has the surgery on Wednesday 06.04.25   Copied from CRM #[id number]. Topic: General - Other >> Dec 16, 2023  8:02 AM Essie A wrote: Reason for CRM: Patient called to let the doctor to let her know she is in the hospital to have an amputation.  Just wanted to let you know.

## 2023-12-16 NOTE — Progress Notes (Signed)
 [facility] night cross cover note:   I was notified by the patient's RN that the patient is experiencing breakthrough leg discomfort, but is not yet eligible for her next prn dose of IV Dilaudid .  She is currently strict n.p.o. in anticipation of BKA this morning.  I subsequently placed order for a one-time additional dose of 0.5 mg of iv dilaudid  x 1 now to help further address her pain.     Camelia Cavalier, DO Hospitalist

## 2023-12-16 NOTE — Progress Notes (Signed)
 PROGRESS NOTE    [name]  ZOX:[id number] DOB: 1961/06/11 DOA: 12/14/2023 PCP: Mercy Stall, MD    No chief complaint on file.   Brief Narrative:   Dana [name] is a pleasant 63 y.o. female with medical history significant for insulin -dependent diabetes mellitus, CKD stage IV, CAD, chronic hypoxic respiratory failure, OSA on CPAP, chronic HFmrEF, depression, anxiety, chronic pain, and left trimalleolar ankle fracture/dislocation status postoperative repair on 11/13/2023 who presented to the [facility] ED the night of 12/12/2023 with fevers, dysuria, and flank pain.  Patient workup in [facility] ED concerning for pyelonephritis, then further workup reveals MRSA bacteremia, in the setting of patient right ankle surgery call site infection, she was transitioned to [facility] for further evaluation.  Assessment & Plan:   Principal Problem:   Surgical site infection Active Problems:   Chronic combined systolic (congestive) and diastolic (congestive) heart failure (HCC)   Chronic respiratory failure with hypoxia (HCC)   OSA on CPAP   Uncontrolled type 1 diabetes mellitus with hyperglycemia, with long-term current use of insulin  (HCC)   Generalized anxiety disorder   CKD stage 4 due to type 1 diabetes mellitus (HCC)   Coronary artery disease involving native coronary artery of native heart with angina pectoris (HCC)   MRSA bacteremia   Depression with anxiety   UTI (urinary tract infection)   Severe protein-calorie malnutrition (HCC)   Charcot's joint of foot in type 2 diabetes mellitus (HCC)  Severe sepsis, present on admission MRSA bacteremia Left ankle post op infection Bilateral pyelonephritis - Severe sepsis present on admission when she is at [facility], persist during this hospital stay as well - 2 sets of cultures x 2 bottles growing gram-positive cocci, MRSA by PCR - recent left ankle fracture repair, site appears infected, draining purulent  discharge, ortho consulted. - Continue with IV vancomycin  for MRSA bacteremia. - CT abdomen pelvis significant for bilateral pyelonephritis, questionable finding in right ovary concerning for infection versus torsion, pelvic ultrasound has been obtained , broadened Rocephin  to IV Zosyn for better coverage, back to cefazolin  today.   -DC midodrine now blood pressure has improved -  right IJ central line discontinued in the setting of her bacteremia  - Charcot collapse of left ankle with failed internal fixation with cellulitis and ulceration with peripheral vascular disease and uncontrolled diabetes mellitus, recommendation for left BKA, plan for surgery on Wednesday  CAD  Elevated troponins -She has had extensive CAD with prior PCI's and multiple evidence of in-stent restenosis in the proximal LAD with last intervention January 2025.   - scoring balloon angioplasty mid LAD 07/2023 -troponins were elevated at the hospital, 0.2, 0.18, 0.16, (the normal range is 0.04), this is secondary to sepsis, and CKD at baseline, no anginal symptoms. -She is on aspirin  and Brilinta , with recent balloon angioplasty, will resume on aspirin , hold Brilinta  in anticipation for surgery.  Will resume Brilinta  postoperatively once cleared by orthopedic.. - Now blood pressure has improved will resume on Coreg .   Chronic HFmrEF  - EF was 40-45% in February 2025  - Appears compensated; diuretic held and IVF given in setting of sepsis  - Repeat 2D echo with a EF of the same 40 to 45%, global hypokinesis, but it does show significant right heart enlargement with elevation of the R VSP. - Home Coreg  as blood pressure improved - Appreciate cardiology input for preop optimization - venous  Dopplers negative for DVT ,VQ scan with evidence of acute PE   CKD  IV  - Appears close to baseline  - Renally-dose medications, monitor      Insulin -dependent DM  - A1c was 10.2% in April 2025  - Check CBGs, continue long- and  short-acting insulin     OSA; chronic hypoxic respiratory failure  - Continue supplemental O2, continue CPAP while sleeping    Depression, anxiety  - Continue doxepin  and low-dose Ativan  as-needed    Anemia of chronic kidney disease -Hemoglobin around baseline at 7.2, and her history of CAD and need for surgery she transfused 1 unit PRBC yesterday, another 2 units ordered today orthopedic in anticipation of her surgery given her cardiac history.   Depression/anxiety - psychiatry input greatly appreciated       DVT prophylaxis: Heparin  Code Status: Full Family Communication: none at bedside Disposition:   Status is: Inpatient   The patient is critically ill with multi-organ failure.  Critical care was necessary to treat or prevent imminent or life-threatening deterioration of severe sepsis, chronic respiratory failure, chronic cardiac failure, chronic renal failure and was exclusive of separately billable procedures and treating other patients. Total critical care time spent by me: 40 mnutes Time spent personally by me on obtaining history from patient or surrogate, evaluation of the patient, evaluation of patient's response to treatment, ordering and review of laboratory studies, ordering and review of radiographic studies, ordering and performing treatments and interventions, and re-evaluation of the patient's condition.   Consultants:  Orthopedic ID will be consulted  Subjective:  Reports dyspnea at baseline, remains afebrile, reports pain at left ankle.  Objective: Vitals:   12/16/23 0855 12/16/23 0919 12/16/23 0933 12/16/23 1217  BP: 133/89 (!) 136/50  (!) 135/50  Pulse: 73 70 69   Resp:  (!) 21 19   Temp: 98.4 F (36.9 C) 98.3 F (36.8 C)  97.7 F (36.5 C)  TempSrc: Oral Temporal  Oral  SpO2: 92% 95% 91%   Weight:      Height:        Intake/Output Summary (Last 24 hours) at 12/16/2023 1547 Last data filed at 12/16/2023 4132 Gross per 24 hour  Intake --   Output 1700 ml  Net -1700 ml   Filed Weights   12/14/23 0100 12/14/23 0200  Weight: 103.3 kg 98 kg    Examination:  Awake Alert, Oriented X 3, frail, anxious, intermittently tearful Symmetrical Chest wall movement, Good air movement bilaterally, CTAB RRR,No Gallops,Rubs or new Murmurs, No Parasternal Heave +ve B.Sounds, Abd Soft, bilateral CVA tenderness Left foot/ankle bandaged Awake Alert, Oriented X 3, frail. Symmetrical Chest wall movement, Good air movement bilaterally, CTAB RRR,No Gallops,Rubs or new Murmurs, No Parasternal Heave +ve B.Sounds, Abd Soft, No tenderness, No rebound - guarding or rigidity. No Cyanosis, + edema, left ankle wrapped.   Data Reviewed: I have personally reviewed following labs and imaging studies  CBC: Recent Labs  Lab 12/14/23 0214 12/15/23 0253 12/16/23 0315  WBC 19.6* 14.7* 11.0*  HGB 7.2* 7.5* 9.2*  HCT 23.5* 23.9* 28.9*  MCV 88.3 87.5 85.0  PLT 265 255 250    Basic Metabolic Panel: Recent Labs  Lab 12/14/23 0214 12/15/23 0253 12/16/23 0315  NA 134* 133* 133*  K 4.3 4.4 4.3  CL 98 101 102  CO2 22 21* 21*  GLUCOSE 137* 134* 111*  BUN 50* 54* 55*  CREATININE 2.25* 2.28* 2.16*  CALCIUM  8.4* 8.1* 8.1*  MG 2.1  --   --     GFR: Estimated Creatinine Clearance: 32.1 mL/min (A) (by C-G formula based on  SCr of 2.16 mg/dL (H)).  Liver Function Tests: Recent Labs  Lab 12/14/23 0214  AST 11*  ALT 11  ALKPHOS 144*  BILITOT 0.6  PROT 6.5  ALBUMIN  2.1*    CBG: Recent Labs  Lab 12/15/23 1635 12/15/23 1947 12/15/23 2321 12/16/23 0402 12/16/23 1232  GLUCAP 165* 195* 146* 122* 106*     Recent Results (from the past 240 hours)  Urine Culture (for pregnant, neutropenic or urologic patients or patients with an indwelling urinary catheter)     Status: Abnormal   Collection Time: 12/14/23  1:56 AM   Specimen: Urine, Clean Catch  Result Value Ref Range Status   Specimen Description URINE, CLEAN CATCH  Final   Special  Requests NONE  Final   Culture (A)  Final    <10,000 COLONIES/mL INSIGNIFICANT GROWTH Performed at [facility], [address] [place], [place] [address]    Report Status 12/15/2023 FINAL  Final  Culture, blood (x 2)     Status: None (Preliminary result)   Collection Time: 12/14/23  2:14 AM   Specimen: BLOOD RIGHT ARM  Result Value Ref Range Status   Specimen Description BLOOD RIGHT ARM  Final   Special Requests   Final    BOTTLES DRAWN AEROBIC AND ANAEROBIC Blood Culture adequate volume   Culture   Final    NO GROWTH 2 DAYS Performed at [facility], [address], [place], [place] [address]    Report Status PENDING  Incomplete  Culture, blood (x 2)     Status: None (Preliminary result)   Collection Time: 12/14/23  2:14 AM   Specimen: BLOOD RIGHT HAND  Result Value Ref Range Status   Specimen Description BLOOD RIGHT HAND  Final   Special Requests   Final    BOTTLES DRAWN AEROBIC AND ANAEROBIC Blood Culture adequate volume   Culture   Final    NO GROWTH 2 DAYS Performed at [facility], [address] [place], [place] [address]    Report Status PENDING  Incomplete         Radiology Studies: NM Pulmonary Perfusion Result Date: 12/16/2023 CLINICAL DATA:  right heart failure, recent surgery, EXAM: NUCLEAR MEDICINE PERFUSION LUNG SCAN TECHNIQUE: Perfusion images were obtained in multiple projections after intravenous injection of radiopharmaceutical. No ventilation imaging performed. RADIOPHARMACEUTICALS:  4.0 mCi Tc-32m MAA IV COMPARISON:  Chest radiographs 12/16/2023 and 12/14/2023. Nuclear medicine perfusion scans 08/27/2023 and 11/01/2016. FINDINGS: Mildly heterogeneous left perihilar perfusion, similar to previous lung scan. No new or wedge-shaped perfusion defects are demonstrated to suggest acute pulmonary embolism. The right lung perfusion appears normal. IMPRESSION: No evidence of acute pulmonary embolism on perfusion scintigraphy by PISAPED criteria.  Mildly heterogeneous left perihilar perfusion, similar to previous examination. Electronically Signed   By: Elmon Hagedorn M.D.   On: 12/16/2023 13:06   DG Chest 1 View Result Date: 12/16/2023 CLINICAL DATA:  For comparison with the 2 scan EXAM: CHEST  1 VIEW COMPARISON:  12/14/2023 FINDINGS: Cardiomegaly. No confluent airspace opacities, effusions or overt edema. No acute bony abnormality. IMPRESSION: Stable cardiomegaly.  No active disease. Electronically Signed   By: Janeece Mechanic M.D.   On: 12/16/2023 12:19   ECHOCARDIOGRAM COMPLETE Result Date: 12/15/2023    ECHOCARDIOGRAM REPORT   Patient Name:   Dana [name] Date of Exam: 12/15/2023 Medical Rec #:  [id number]      Height:       65.0 in Accession #:    [id number]  Weight:       216.0 lb Date of Birth:  1961/07/01      BSA:          2.044 m Patient Age:    62 years       BP:           134/52 mmHg Patient Gender: F              HR:           76 bpm. Exam Location:  Inpatient Procedure: 2D Echo, Cardiac Doppler and Color Doppler (Both Spectral and Color            Flow Doppler were utilized during procedure). Indications:    Bacteremia  History:        Patient has prior history of Echocardiogram examinations, most                 recent 08/27/2023.  Sonographer:    Andrena Bang Referring Phys: [id number] TIMOTHY S OPYD  Sonographer Comments: Technically difficult study due to poor echo windows, suboptimal parasternal window and suboptimal subcostal window. IMPRESSIONS  1. Left ventricular ejection fraction, by estimation, is 40 to 45%. The left ventricle has mildly decreased function. The left ventricle demonstrates global hypokinesis. There is mild left ventricular hypertrophy. Left ventricular diastolic function could not be evaluated. There is the interventricular septum is flattened in systole and diastole, consistent with right ventricular pressure and volume overload.  2. Right ventricular systolic function is low normal. The right ventricular size is  enlarged. There is severely elevated pulmonary artery systolic pressure. The estimated right ventricular systolic pressure is 65.7 mmHg.  3. Left atrial size was mildly dilated.  4. S/P mitral clip, mild to moderate MR, no valvular stenosis (MG 5 mmHg, HR 72bpm). . The mean mitral valve gradient is 5.0 mmHg with average heart rate of 72 bpm.  5. Tricuspid valve regurgitation is moderate.  6. The aortic valve is tricuspid. Aortic valve regurgitation is not visualized. Aortic valve sclerosis is present, with no evidence of aortic valve stenosis.  7. Pulmonic valve regurgitation is moderate.  8. The inferior vena cava is dilated in size with <50% respiratory variability, suggesting right atrial pressure of 15 mmHg. Comparison(s): A prior study was performed on 08/27/2023. Right ventricular size is now enlarged, RVSP suggestive of severely elevated pulmonary pressures, interventricular septum is flattened in systole and diastole, consistent with right ventricular pressure and volume overload, RAP is now . Conclusion(s)/Recommendation(s): No evidence of valvular vegetations on this transthoracic echocardiogram. Consider a transesophageal echocardiogram to exclude infective endocarditis if clinically indicated. Given the dilated RV, elevated RVSP, and low normal systolic function, rule out pulmonary embolism, if clinically indicated. FINDINGS  Left Ventricle: Left ventricular ejection fraction, by estimation, is 40 to 45%. The left ventricle has mildly decreased function. The left ventricle demonstrates global hypokinesis. Definity  contrast agent was given IV to delineate the left ventricular  endocardial borders. The left ventricular internal cavity size was normal in size. There is mild left ventricular hypertrophy. The interventricular septum is flattened in systole and diastole, consistent with right ventricular pressure and volume overload. Left ventricular diastolic function could not be evaluated due to  mitral valve repair. Left ventricular diastolic function could not be evaluated. Right Ventricle: The right ventricular size is enlarged. Right vetricular wall thickness was not well visualized. Right ventricular systolic function is low normal. There is severely elevated pulmonary artery systolic pressure. The tricuspid regurgitant velocity is 3.56 m/s, and with an  assumed right atrial pressure of 15 mmHg, the estimated right ventricular systolic pressure is 65.7 mmHg. Left Atrium: Left atrial size was mildly dilated. Right Atrium: Right atrial size was not well visualized. Pericardium: There is no evidence of pericardial effusion. Mitral Valve: S/P mitral clip, mild to moderate MR, no valvular stenosis (MG 5 mmHg, HR 72bpm). MV peak gradient, 16.8 mmHg. The mean mitral valve gradient is 5.0 mmHg with average heart rate of 72 bpm. Tricuspid Valve: The tricuspid valve is normal in structure. Tricuspid valve regurgitation is moderate . No evidence of tricuspid stenosis. Aortic Valve: The aortic valve is tricuspid. Aortic valve regurgitation is not visualized. Aortic valve sclerosis is present, with no evidence of aortic valve stenosis. Aortic valve mean gradient measures 3.0 mmHg. Aortic valve peak gradient measures 6.0  mmHg. Aortic valve area, by VTI measures 1.81 cm. Pulmonic Valve: The pulmonic valve was normal in structure. Pulmonic valve regurgitation is moderate. No evidence of pulmonic stenosis. Aorta: The aortic root and ascending aorta are structurally normal, with no evidence of dilitation. Venous: The inferior vena cava is dilated in size with less than 50% respiratory variability, suggesting right atrial pressure of 15 mmHg. IAS/Shunts: The interatrial septum was not well visualized.  LEFT VENTRICLE PLAX 2D LVIDd:         5.10 cm      Diastology LVIDs:         3.50 cm      LV e' medial: 3.26 cm/s LV PW:         1.10 cm LV IVS:        1.10 cm LVOT diam:     1.80 cm LV SV:         42 LV SV Index:   20  LVOT Area:     2.54 cm  LV Volumes (MOD) LV vol d, MOD A2C: 196.0 ml LV vol d, MOD A4C: 210.0 ml LV vol s, MOD A2C: 99.5 ml LV vol s, MOD A4C: 105.0 ml LV SV MOD A2C:     96.5 ml LV SV MOD A4C:     210.0 ml LV SV MOD BP:      104.4 ml RIGHT VENTRICLE RV S prime:     9.14 cm/s TAPSE (M-mode): 1.7 cm LEFT ATRIUM             Index LA Vol (A2C):   77.5 ml 37.91 ml/m LA Vol (A4C):   62.3 ml 30.48 ml/m LA Biplane Vol: 73.9 ml 36.15 ml/m  AORTIC VALVE AV Area (Vmax):    1.76 cm AV Area (Vmean):   1.74 cm AV Area (VTI):     1.81 cm AV Vmax:           122.00 cm/s AV Vmean:          72.300 cm/s AV VTI:            0.231 m AV Peak Grad:      6.0 mmHg AV Mean Grad:      3.0 mmHg LVOT Vmax:         84.20 cm/s LVOT Vmean:        49.300 cm/s LVOT VTI:          0.164 m LVOT/AV VTI ratio: 0.71  AORTA Ao Sinus diam: 2.90 cm Ao Asc diam:   3.10 cm MITRAL VALVE              TRICUSPID VALVE MV Area VTI:  0.82 cm    TR Peak grad:  50.7 mmHg MV Peak grad: 16.8 mmHg   TR Vmax:        356.00 cm/s MV Mean grad: 5.0 mmHg MV Vmax:      2.05 m/s    SHUNTS MV Vmean:     99.4 cm/s   Systemic VTI:  0.16 m MR Peak grad: 101.6 mmHg  Systemic Diam: 1.80 cm MR Mean grad: 65.0 mmHg MR Vmax:      504.00 cm/s MR Vmean:     378.0 cm/s Sunit Tolia Electronically signed by Olinda Bertrand Signature Date/Time: 12/15/2023/8:02:30 PM    Final    VAS US  LOWER EXTREMITY VENOUS (DVT) Result Date: 12/15/2023  Lower Venous DVT Study Patient Name:  Dana [name]  Date of Exam:   12/15/2023 Medical Rec #: [id number]       Accession #:    [id number] Date of Birth: 09-03-1960       Patient Gender: F Patient Age:   25 years Exam Location:  [facility] Procedure:      VAS US  LOWER EXTREMITY VENOUS (DVT) Referring Phys: Skylur Fuston --------------------------------------------------------------------------------  Indications: Edema.  Risk Factors: None identified. Limitations: Poor ultrasound/tissue interface and patient pain tolerance. Comparison Study: No  prior studies. Performing Technologist: Lerry Ransom RVT  Examination Guidelines: A complete evaluation includes B-mode imaging, spectral Doppler, color Doppler, and power Doppler as needed of all accessible portions of each vessel. Bilateral testing is considered an integral part of a complete examination. Limited examinations for reoccurring indications may be performed as noted. The reflux portion of the exam is performed with the patient in reverse Trendelenburg.  +---------+---------------+---------+-----------+----------+--------------+ RIGHT    CompressibilityPhasicitySpontaneityPropertiesThrombus Aging +---------+---------------+---------+-----------+----------+--------------+ CFV      Full           Yes      Yes                                 +---------+---------------+---------+-----------+----------+--------------+ SFJ      Full                                                        +---------+---------------+---------+-----------+----------+--------------+ FV Prox  Full                                                        +---------+---------------+---------+-----------+----------+--------------+ FV Mid   Full                                                        +---------+---------------+---------+-----------+----------+--------------+ FV Distal               Yes      Yes                                 +---------+---------------+---------+-----------+----------+--------------+ PFV      Full                                                        +---------+---------------+---------+-----------+----------+--------------+  POP      Full           Yes      Yes                                 +---------+---------------+---------+-----------+----------+--------------+ PTV      Full                                                        +---------+---------------+---------+-----------+----------+--------------+ PERO     Full                                                         +---------+---------------+---------+-----------+----------+--------------+   +---------+---------------+---------+-----------+----------+-------------------+ LEFT     CompressibilityPhasicitySpontaneityPropertiesThrombus Aging      +---------+---------------+---------+-----------+----------+-------------------+ CFV      Full           Yes      Yes                                      +---------+---------------+---------+-----------+----------+-------------------+ SFJ      Full                                                             +---------+---------------+---------+-----------+----------+-------------------+ FV Prox  Full                                                             +---------+---------------+---------+-----------+----------+-------------------+ FV Mid                  Yes      Yes                                      +---------+---------------+---------+-----------+----------+-------------------+ FV Distal               Yes      Yes                                      +---------+---------------+---------+-----------+----------+-------------------+ PFV      Full                                                             +---------+---------------+---------+-----------+----------+-------------------+ POP      Full  Yes      Yes                                      +---------+---------------+---------+-----------+----------+-------------------+ PTV      Full                                                             +---------+---------------+---------+-----------+----------+-------------------+ PERO                                                  Not well visualized +---------+---------------+---------+-----------+----------+-------------------+     Summary: RIGHT: - There is no evidence of deep vein thrombosis in the lower extremity. However, portions of this  examination were limited- see technologist comments above.  - No cystic structure found in the popliteal fossa.  LEFT: - There is no evidence of deep vein thrombosis in the lower extremity. However, portions of this examination were limited- see technologist comments above.  - No cystic structure found in the popliteal fossa.  *See table(s) above for measurements and observations. Electronically signed by Jimmye Moulds MD on 12/15/2023 at 12:20:09 PM.    Final    US  PELVIS (TRANSABDOMINAL ONLY) Result Date: 12/14/2023 CLINICAL DATA:  Pelvic inflammatory disease EXAM: TRANSABDOMINAL ULTRASOUND OF PELVIS TECHNIQUE: Transabdominal ultrasound examination of the pelvis was performed including evaluation of the uterus, ovaries, adnexal regions, and pelvic cul-de-sac. COMPARISON:  CT 12/14/2023 FINDINGS: Uterus Absent Right ovary Not visualized.  No definite adnexal mass identified. Left ovary Not visualized.  No definite adnexal mass identified. Other findings: Trace simple appearing free fluid within the right adnexa best seen on cine images. IMPRESSION: 1. Status post hysterectomy. 2. Nonvisualization of the ovaries bilaterally. If indicated, pelvic MRI examination with contrast may be helpful for further evaluation. 3. Trace simple appearing free fluid within the right adnexa, nonspecific. Electronically Signed   By: Worthy Heads M.D.   On: 12/14/2023 19:23   CT RENAL STONE STUDY Result Date: 12/14/2023 CLINICAL DATA:  Sepsis with bilateral pyelonephritis. Evaluate for hydronephrosis. EXAM: CT ABDOMEN AND PELVIS WITHOUT CONTRAST TECHNIQUE: Multidetector CT imaging of the abdomen and pelvis was performed following the standard protocol without IV contrast. RADIATION DOSE REDUCTION: This exam was performed according to the departmental dose-optimization program which includes automated exposure control, adjustment of the mA and/or kV according to patient size and/or use of iterative reconstruction technique.  COMPARISON:  CT abdomen and pelvis 12/13/2023. FINDINGS: Lower chest: There is atelectasis in the lung bases. There are trace bilateral pleural effusions. The heart is enlarged. Hepatobiliary: No focal liver abnormality is seen. Status post cholecystectomy. No biliary dilatation. Pancreas: Unremarkable. No pancreatic ductal dilatation or surrounding inflammatory changes. Spleen: Mildly enlarged. Adrenals/Urinary Tract: Adrenal glands are unremarkable. Kidneys are normal, without renal calculi, focal lesion, or hydronephrosis. There is mild nonspecific bilateral perinephric fat stranding, mildly increased. Bladder is unremarkable. Stomach/Bowel: Stomach is within normal limits. Appendix is not seen. No evidence of bowel wall thickening, distention, or inflammatory changes. Vascular/Lymphatic: Aortic atherosclerosis. No enlarged abdominal or pelvic lymph nodes. There are extensive vascular calcifications the abdomen and pelvis.  Reproductive: Uterus is surgically absent. The left ovary appears enlarged with surrounding inflammatory stranding and fluid, a new finding. The right ovary appears within normal limits. Other: There is mild body wall edema which has increased. There is no ascites, but there is increasing presacral edema. Medication injection sites are seen in the subcutaneous tissues of the lower anterior abdominal wall. Musculoskeletal: No fracture is seen. IMPRESSION: 1. No hydronephrosis. 2. Mild nonspecific bilateral perinephric fat stranding, mildly increased. Correlate clinically for infection. 3. Enlarged left ovary with surrounding inflammatory stranding and fluid, a new finding. Findings may be related to ovarian torsion or infection. Recommend further evaluation with pelvic ultrasound. 4. Increasing body wall edema and presacral edema. 5. Trace bilateral pleural effusions. 6. Cardiomegaly. 7. Mild splenomegaly. 8. Aortic atherosclerosis. Aortic Atherosclerosis (ICD10-I70.0). Electronically Signed    By: Tyron Gallon M.D.   On: 12/14/2023 16:54        Scheduled Meds:  allopurinol   50 mg Oral Daily   aspirin  EC  81 mg Oral Daily   carvedilol   6.25 mg Oral BID WC   Chlorhexidine  Gluconate Cloth  6 each Topical Daily   diphenhydrAMINE   25 mg Oral Once   escitalopram  10 mg Oral Daily   gabapentin   300 mg Oral TID   heparin  injection (subcutaneous)  5,000 Units Subcutaneous Q8H   insulin  aspart  0-9 Units Subcutaneous Q4H   insulin  glargine-yfgn  10 Units Subcutaneous Daily   latanoprost   1 drop Both Eyes QHS   pantoprazole   40 mg Oral Daily   sodium chloride  flush  3 mL Intravenous Q12H   Continuous Infusions:   ceFAZolin  (ANCEF ) IV 1 g (12/16/23 1522)   vancomycin  1,250 mg (12/16/23 0541)     LOS: 2 days       Seena Dadds, MD [facility]   To contact the attending provider between 7A-7P or the covering provider during after hours 7P-7A, please log into the web site [email or web address] and access using universal [place] password for that web site. If you do not have the password, please call the hospital operator.  12/16/2023, 3:47 PM

## 2023-12-16 NOTE — Progress Notes (Addendum)
   [facility] has been requested to perform a transesophageal echocardiogram on Dana [name] for bacteremia.  After careful review of history and examination, the risks and benefits of transesophageal echocardiogram have been explained including risks of esophageal damage, perforation (1:10,000 risk), bleeding, pharyngeal hematoma as well as other potential complications associated with conscious sedation including aspiration, arrhythmia, respiratory failure and death. Alternatives to treatment were discussed, questions were answered. Patient is willing to proceed.   Addendum: patient with questionable PE based on new severe RVSP. VQ scan ordered. Reschedule TEE for tomorrow if no PE.   Burnetta Cart, PA-C  12/16/2023 8:32 AM

## 2023-12-16 NOTE — Progress Notes (Incomplete)
 Vanc dose

## 2023-12-16 NOTE — Consult Note (Signed)
 [facility] Psychiatric Consult Initial  Patient Name: .Dana [name]  MRN: [id number]  DOB: 1960/10/06  Consult Order details:  Orders (From admission, onward)     Start     Ordered   12/15/23 1144  IP CONSULT TO PSYCHIATRY       Comments: This is nonurgent consult, patient is significantly depressed, as she will be going for left BKA, does appear to depressed, she is seeing a therapist as an outpatient, and is asking to talk to psychiatry to see it helps with her emotion.  Ordering Provider: Epifanio Haste, MD  Provider:  (Not yet assigned)  Question Answer Comment  Location [facility]   Reason for Consult? depression      12/15/23 1144             Mode of Visit: In person    Psychiatry Consult Evaluation  Service Date: December 16, 2023 LOS:  LOS: 2 days  Chief Complaint "Not so hot"  Primary Psychiatric Diagnoses  General anxiety disorder   Assessment  Dana [name] is a 63 y.o. female admitted: Medicallyfor 12/14/2023 12:50 AM for history significant for insulin -dependent diabetes mellitus, CKD stage IV, CAD, chronic hypoxic respiratory failure, OSA on CPAP, chronic HFmrEF, depression, anxiety, chronic pain, and left trimalleolar ankle fracture/dislocation status postoperative repair on 11/13/2023 who presented to the [facility] ED the night of 12/12/2023 with fevers, dysuria, and flank pain, history of anxiety.  Her current presentation of excessive worry, feeling overwhelmed, feelings of pending doom, and difficulty concentrating is most consistent with GAD. Current outpatient psychotropic medications include Lexapro, Ativan , gabapentin  and historically she has had a positive response to these medications. She was compliant with medications prior to admission as evidenced by self-report. On initial examination, patient pleasant and easily expressed her thoughts and concerns. Please see plan below for detailed recommendations.   Diagnoses:  Active  Hospital problems: Principal Problem:   Surgical site infection Active Problems:   Chronic combined systolic (congestive) and diastolic (congestive) heart failure (HCC)   Chronic respiratory failure with hypoxia (HCC)   OSA on CPAP   Uncontrolled type 1 diabetes mellitus with hyperglycemia, with long-term current use of insulin  (HCC)   Generalized anxiety disorder   CKD stage 4 due to type 1 diabetes mellitus (HCC)   Coronary artery disease involving native coronary artery of native heart with angina pectoris (HCC)   MRSA bacteremia   Depression with anxiety   UTI (urinary tract infection)   Severe protein-calorie malnutrition (HCC)   Charcot's joint of foot in type 2 diabetes mellitus (HCC)    Plan   ## Psychiatric Medication Recommendations:  Continue Lexapro 10 mg daily (just started recently) Continue gabapentin  300 mg TID  Continue Ativan  0.5 mg BID PRN   ## Medical Decision Making Capacity: Not specifically addressed in this encounter  ## Further Work-up:  -- most recent EKG on 11/11/2023 had QtC of 455 -- Pertinent labwork reviewed earlier this admission includes: chem panel, CBG, EKG, urine   ## Disposition:-- There are no psychiatric contraindications to discharge at this time  ## Behavioral / Environmental: - No specific recommendations at this time.     ## Safety and Observation Level:  - Based on my clinical evaluation, I estimate the patient to be at low risk of self harm in the current setting. - At this time, we recommend  routine. This decision is based on my review of the chart including patient's history and current presentation, interview of the  patient, mental status examination, and consideration of suicide risk including evaluating suicidal ideation, plan, intent, suicidal or self-harm behaviors, risk factors, and protective factors. This judgment is based on our ability to directly address suicide risk, implement suicide prevention strategies, and develop  a safety plan while the patient is in the clinical setting. Please contact our team if there is a concern that risk level has changed.  CSSR Risk Category:C-SSRS RISK CATEGORY: No Risk  Suicide Risk Assessment: Patient has following modifiable risk factors for suicide: none which we are addressing by therapy. Patient has following non-modifiable or demographic risk factors for suicide: none Patient has the following protective factors against suicide: Access to outpatient mental health care, Supportive family, Supportive friends, Cultural, spiritual, or religious beliefs that discourage suicide, Pets in the home, Frustration tolerance, no history of suicide attempts, and no history of NSSIB  Thank you for this consult request. Recommendations have been communicated to the primary team.  We will continue to follow at this time.   Jarmel Linhardt, MD       History of Present Illness  Relevant Aspects of [facility] Course:  Admitted on 12/14/2023 for history significant for insulin -dependent diabetes mellitus, CKD stage IV, CAD, chronic hypoxic respiratory failure, OSA on CPAP, chronic HFmrEF, depression, anxiety, chronic pain, and left trimalleolar ankle fracture/dislocation status postoperative repair on 11/13/2023 who presented to the [facility] ED the night of 12/12/2023 with fevers, dysuria, and flank pain; history of anxiety  Patient Report:   Discussed with patient, notes that she is worried and frustrated that her BKA was pushed back from its original date.  She has been understandably upset about this.  Discussed importance of ensuring that she is medically appropriate/stable for surgery, patient expressed understanding.  Patient had enjoyed face timingwith family members last night, and had been working on "thinking about 3 good things" before bedtime.  Discussed importance of identifying unhelpful/catastrophic thinking and redirecting course.  Patient was able to name many protective  factors including family members and wanting to walk on the beach after her surgery.  Patient was occasionally distressed/emotional but was able to contribute productively to conversation.  Denied suicidal or homicidal ideation.  Denied auditory or visual hallucinations.  We will follow tomorrow.  Psych ROS:  Depression: low Anxiety:  high Mania (lifetime and current): none Psychosis: (lifetime and current): none   Review of Systems  Psychiatric/Behavioral:  The patient is nervous/anxious.      Psychiatric and Social History  Psychiatric History:  Information collected from patient and chart  Prev Dx/Sx: anxiety Current Psych Provider: none Home Meds (current): Lexapro, gabapentin , Ativan  Previous Med Trials: none Therapy: none  Prior Psych Hospitalization: none  Prior Self Harm: none Prior Violence: none  Family Psych History: none Family Hx suicide: none  Social History:  Occupational Hx: disability Living Situation: lives with her husband Spiritual Hx: yes Access to weapons/lethal means: none   Substance History No substance abuse history  Exam Findings  Physical Exam: completed by the MD, reviewed Vital Signs:  Temp:  [97.5 F (36.4 C)-99.3 F (37.4 C)] 98.3 F (36.8 C) (06/02 0919) Pulse Rate:  [69-73] 69 (06/02 0933) Resp:  [17-21] 19 (06/02 0933) BP: (110-137)/(50-90) 136/50 (06/02 0919) SpO2:  [91 %-95 %] 91 % (06/02 0933) Blood pressure (!) 136/50, pulse 69, temperature 98.3 F (36.8 C), temperature source Temporal, resp. rate 19, height 5\' 5"  (1.651 m), weight 98 kg, SpO2 91%. Body mass index is 35.95 kg/m.  Physical Exam Vitals and nursing  note reviewed.  Constitutional:      Appearance: Normal appearance.  HENT:     Head: Normocephalic.     Nose: Nose normal.  Pulmonary:     Effort: Pulmonary effort is normal.  Musculoskeletal:     Cervical back: Normal range of motion.  Neurological:     General: No focal deficit present.     Mental  Status: She is alert and oriented to person, place, and time.     Mental Status Exam: General Appearance: Casual  Orientation:  Full (Time, Place, and Person)  Memory:  Immediate;   Good Recent;   Good Remote;   Good  Concentration:  Concentration: Good and Attention Span: Good  Recall:  Good  Attention  Good  Eye Contact:  Fair  Speech:  Clear and Coherent  Language:  Good  Volume:  Normal  Mood: anxious  Affect:  Congruent  Thought Process:  Coherent  Thought Content:  logical  Suicidal Thoughts:  No  Homicidal Thoughts:  No  Judgement:  Good  Insight:  Good  Psychomotor Activity:  Decreased  Akathisia:  No  Fund of Knowledge:  Good      Assets:  Communication Skills Desire for Improvement Housing Intimacy Leisure Time Resilience Social Support  Cognition:  WNL  ADL's:  Impaired  AIMS (if indicated):        Other History   These have been pulled in through the EMR, reviewed, and updated if appropriate.  Family History:  The patient's family history includes Bipolar disorder in an other family member; Breast cancer (age of onset: 109) in her mother; Cancer in an other family member; Coronary artery disease in her mother; Depression in an other family member; Diabetes in her father; Diabetes type II in her mother; Hyperlipidemia in her mother; Hypertension in her father and mother; Lung cancer (age of onset: 31) in her mother; Mental illness in her sister; Schizophrenia in an other family member.  Medical History: Past Medical History:  Diagnosis Date   Acquired absence of left great toe (HCC)    Acquired absence of other left toe(s) (HCC)    ACS (acute coronary syndrome) (HCC)    Acute bronchitis due to COVID-19 virus 03/03/2023   Acute on chronic combined systolic and diastolic CHF (congestive heart failure) (HCC) 09/16/2023   Acute respiratory failure with hypoxia (HCC) 07/20/2023   Anemia    Anoxic brain injury (HCC)    Anxiety    CAD (coronary artery  disease)    DES mLAD 04/07/15; DES dRCA & DES pPDA 08/30/16; DES mCX & PTCA OM1 09/29/20   Chronic diastolic (congestive) heart failure (HCC)    Chronic pain    Chronic systolic (congestive) heart failure (HCC)    CKD (chronic kidney disease)    Contrast dye induced nephropathy, possible 09/03/2020   COPD (chronic obstructive pulmonary disease) (HCC)    COVID-19 virus infection 03/03/2023   Demand ischemia (HCC)    Diabetes (HCC)type 1    Diastolic CHF (HCC)    Dyspnea    Encounter for screening mammogram for malignant neoplasm of breast 09/16/2023   Family history of breast cancer 10/23/2021   Gastro-esophageal reflux disease without esophagitis    Hyperlipidemia    Hypertension    Hypertensive heart disease with heart failure (HCC)    Hypoxemia    Idiopathic gout, multiple sites    Leukocytosis 03/29/2022   Localized edema    Low back pain    Mitral regurgitation    Mitral  regurgitation    Mixed hyperlipidemia    Mixed incontinence    Morbid (severe) obesity due to excess calories (HCC)    Neuromuscular disorder (HCC)    Neuropathy    Non-ST elevation (NSTEMI) myocardial infarction (HCC)    08/29/20, 09/29/20   OSA (obstructive sleep apnea) 09/03/2020   Other specified hypothyroidism    PAF (paroxysmal atrial fibrillation) (HCC)    s/p pulmonary vein isolation by cryoablation 10/04/15 Dr. Jearldine Mina in [facility]   PEA (Pulseless electrical activity) [facility])    ~ 01/13/21 at [facility] during admission DKA, volume overload, possible CAP, hypoxia s/p ACLS with ROSC ~ 10-15   Pneumonia    Primary insomnia    Rheumatoid arthritis (HCC)    Stroke (HCC)    Thyroid  disease    Type 1 diabetes mellitus (HCC)    URI with cough and congestion 08/22/2023   Vitamin D  deficiency     Surgical History: Past Surgical History:  Procedure Laterality Date   ABDOMINAL HYSTERECTOMY     Partial- Still has both ovaries   ANKLE FUSION Left 11/13/2023   Procedure: HINDFOOT FUSION FIXATION OF  ANKLE;  Surgeon: Laneta Pintos, MD;  Location: [facility] OR;  Service: Orthopedics;  Laterality: Left;   CARDIOVASCULAR STRESS TEST  08/13/2014   Nuclear; Normal   CARPAL TUNNEL RELEASE     CATARACT EXTRACTION, BILATERAL     CESAREAN SECTION     CHOLECYSTECTOMY     CORONARY ANGIOPLASTY WITH STENT PLACEMENT     3 blockages/ 1 stent   CORONARY BALLOON ANGIOPLASTY N/A 07/18/2023   Procedure: CORONARY BALLOON ANGIOPLASTY;  Surgeon: Odie Benne, MD;  Location: [facility] INVASIVE CV LAB;  Service: Cardiovascular;  Laterality: N/A;   CORONARY PRESSURE/FFR STUDY N/A 09/07/2022   Procedure: INTRAVASCULAR PRESSURE WIRE/FFR STUDY;  Surgeon: Swaziland, Peter M, MD;  Location: [facility] INVASIVE CV LAB;  Service: Cardiovascular;  Laterality: N/A;   CORONARY STENT INTERVENTION N/A 09/29/2020   Procedure: CORONARY STENT INTERVENTION;  Surgeon: Arnoldo Lapping, MD;  Location: [facility] INVASIVE CV LAB;  Service: Cardiovascular;  Laterality: N/A;  CFX   CORONARY STENT INTERVENTION N/A 09/07/2022   Procedure: CORONARY STENT INTERVENTION;  Surgeon: Swaziland, Peter M, MD;  Location: [facility] INVASIVE CV LAB;  Service: Cardiovascular;  Laterality: N/A;   CORONARY STENT INTERVENTION N/A 02/13/2023   Procedure: CORONARY STENT INTERVENTION;  Surgeon: Odie Benne, MD;  Location: [facility] INVASIVE CV LAB;  Service: Cardiovascular;  Laterality: N/A;  LAD   CORONARY ULTRASOUND/IVUS N/A 02/13/2023   Procedure: Coronary Ultrasound/IVUS;  Surgeon: Odie Benne, MD;  Location: [facility] INVASIVE CV LAB;  Service: Cardiovascular;  Laterality: N/A;   CORONARY/GRAFT ACUTE MI REVASCULARIZATION N/A 12/08/2022   Procedure: Coronary/Graft Acute MI Revascularization;  Surgeon: Arleen Lacer, MD;  Location: [facility] INVASIVE CV LAB;  Service: Cardiovascular;  Laterality: N/A;   EYE SURGERY     LEFT HEART CATH AND CORONARY ANGIOGRAPHY N/A 09/29/2020   Procedure: LEFT HEART CATH AND CORONARY ANGIOGRAPHY;  Surgeon: Arnoldo Lapping, MD;  Location: [facility]  INVASIVE CV LAB;  Service: Cardiovascular;  Laterality: N/A;   LEFT HEART CATH AND CORONARY ANGIOGRAPHY N/A 09/07/2022   Procedure: LEFT HEART CATH AND CORONARY ANGIOGRAPHY;  Surgeon: Swaziland, Peter M, MD;  Location: [facility] INVASIVE CV LAB;  Service: Cardiovascular;  Laterality: N/A;   LEFT HEART CATH AND CORONARY ANGIOGRAPHY N/A 12/08/2022   Procedure: LEFT HEART CATH AND CORONARY ANGIOGRAPHY;  Surgeon: Arleen Lacer, MD;  Location: [facility] INVASIVE CV LAB;  Service: Cardiovascular;  Laterality: N/A;   LEFT HEART CATH AND CORONARY ANGIOGRAPHY N/A 02/13/2023   Procedure: LEFT HEART CATH AND CORONARY ANGIOGRAPHY;  Surgeon: Odie Benne, MD;  Location: [facility] INVASIVE CV LAB;  Service: Cardiovascular;  Laterality: N/A;   LEFT HEART CATH AND CORONARY ANGIOGRAPHY N/A 07/16/2023   Procedure: LEFT HEART CATH AND CORONARY ANGIOGRAPHY;  Surgeon: Odie Benne, MD;  Location: [facility] INVASIVE CV LAB;  Service: Cardiovascular;  Laterality: N/A;   RIGHT HEART CATH N/A 04/27/2021   Procedure: RIGHT HEART CATH;  Surgeon: Mardell Shade, MD;  Location: [facility] INVASIVE CV LAB;  Service: Cardiovascular;  Laterality: N/A;   RIGHT/LEFT HEART CATH AND CORONARY ANGIOGRAPHY N/A 01/07/2017   Procedure: Right/Left Heart Cath and Coronary Angiography;  Surgeon: Darlis Eisenmenger, MD;  Location: [facility] INVASIVE CV LAB;  Service: Cardiovascular;  Laterality: N/A;   ROTATOR CUFF REPAIR     TEE WITHOUT CARDIOVERSION N/A 04/28/2021   Procedure: TRANSESOPHAGEAL ECHOCARDIOGRAM (TEE);  Surgeon: Mardell Shade, MD;  Location: [facility] ENDOSCOPY;  Service: Cardiovascular;  Laterality: N/A;   TEE WITHOUT CARDIOVERSION N/A 05/18/2021   Procedure: TRANSESOPHAGEAL ECHOCARDIOGRAM (TEE);  Surgeon: Thukkani, Arun K, MD;  Location: [facility] INVASIVE CV LAB;  Service: Cardiovascular;  Laterality: N/A;   TOE AMPUTATION Left    TRANSCATHETER MITRAL EDGE TO EDGE REPAIR N/A 05/18/2021   Procedure: MITRAL VALVE REPAIR;  Surgeon: Kyra Phy,  MD;  Location: [facility] INVASIVE CV LAB;  Service: Cardiovascular;  Laterality: N/A;   US  ECHOCARDIOGRAPHY  05/2017   Normal     Medications:   Current Facility-Administered Medications:    acetaminophen  (TYLENOL ) tablet 650 mg, 650 mg, Oral, Q6H PRN **OR** acetaminophen  (TYLENOL ) suppository 650 mg, 650 mg, Rectal, Q6H PRN, Opyd, Timothy S, MD   allopurinol  (ZYLOPRIM ) tablet 50 mg, 50 mg, Oral, Daily, Opyd, Timothy S, MD, 50 mg at 12/15/23 3474   aspirin  EC tablet 81 mg, 81 mg, Oral, Daily, Elgergawy, Dawood S, MD, 81 mg at 12/15/23 2595   carvedilol  (COREG ) tablet 6.25 mg, 6.25 mg, Oral, BID WC, Elgergawy, Dawood S, MD   ceFAZolin  (ANCEF ) IVPB 1 g/50 mL premix, 1 g, Intravenous, Q8H, Liane Redman, MD   Chlorhexidine  Gluconate Cloth 2 % PADS 6 each, 6 each, Topical, Daily, Elgergawy, Ardia Kraft, MD, 6 each at 12/15/23 0818   diphenhydrAMINE  (BENADRYL ) capsule 25 mg, 25 mg, Oral, Once, Elgergawy, Dawood S, MD   escitalopram (LEXAPRO) tablet 10 mg, 10 mg, Oral, Daily, Elgergawy, Dawood S, MD, 10 mg at 12/15/23 1205   furosemide  (LASIX ) injection 40 mg, 40 mg, Intravenous, Once, Elgergawy, Ardia Kraft, MD   gabapentin  (NEURONTIN ) capsule 300 mg, 300 mg, Oral, TID, Opyd, Timothy S, MD, 300 mg at 12/15/23 2158   heparin  injection 5,000 Units, 5,000 Units, Subcutaneous, Q8H, Elgergawy, Ardia Kraft, MD, 5,000 Units at 12/16/23 0539   HYDROmorphone  (DILAUDID ) injection 0.5 mg, 0.5 mg, Intravenous, Q4H PRN, Elgergawy, Ardia Kraft, MD, 0.5 mg at 12/16/23 1027   insulin  aspart (novoLOG ) injection 0-9 Units, 0-9 Units, Subcutaneous, Q4H, Opyd, Timothy S, MD, 1 Units at 12/16/23 0406   insulin  glargine-yfgn (SEMGLEE ) injection 10 Units, 10 Units, Subcutaneous, Daily, Opyd, Timothy S, MD, 10 Units at 12/15/23 1205   latanoprost  (XALATAN ) 0.005 % ophthalmic solution 1 drop, 1 drop, Both Eyes, QHS, Elgergawy, Dawood S, MD, 1 drop at 12/15/23 2201   LORazepam  (ATIVAN ) tablet 0.5 mg, 0.5 mg, Oral, QID PRN, Elgergawy,  Dawood S, MD, 0.5 mg at 12/16/23 0248   oxyCODONE  (Oxy IR/ROXICODONE ) immediate release tablet  5 mg, 5 mg, Oral, Q4H PRN, Opyd, Timothy S, MD, 5 mg at 12/15/23 0817   pantoprazole  (PROTONIX ) EC tablet 40 mg, 40 mg, Oral, Daily, Opyd, Timothy S, MD, 40 mg at 12/15/23 4098   prochlorperazine  (COMPAZINE ) injection 5 mg, 5 mg, Intravenous, Q6H PRN, Opyd, Timothy S, MD, 5 mg at 12/15/23 1649   senna (SENOKOT) tablet 8.6 mg, 1 tablet, Oral, Daily PRN, Opyd, Santana Cue, MD   sodium chloride  flush (NS) 0.9 % injection 10-40 mL, 10-40 mL, Intracatheter, PRN, Elgergawy, Dawood S, MD   sodium chloride  flush (NS) 0.9 % injection 3 mL, 3 mL, Intravenous, Q12H, Opyd, Timothy S, MD, 3 mL at 12/15/23 2200   Vancomycin  (VANCOCIN ) 1,250 mg in sodium chloride  0.9 % 250 mL IVPB, 1,250 mg, Intravenous, Q48H, Adaline Ada, RPH, Last Rate: 166.7 mL/hr at 12/16/23 0541, 1,250 mg at 12/16/23 0541  Allergies: Allergies  Allergen Reactions   Naproxen Hives and Rash   Shellfish-Derived Products Hives, Swelling and Other (See Comments)    Angioedema    Strawberry (Diagnostic) Hives   Celecoxib Other (See Comments)    Stomach pain   Doxepin  Hcl Other (See Comments)    Lethargic, sleeping a lot per pt and family   Glucosamine Hives, Swelling and Other (See Comments)    Angioedema    Iron  Hives and Other (See Comments)    IV iron  transfusion - hives - Feb 2022 hospitalization  Patient said she CAN tolerate if given Benadryl  first   Metoclopramide Other (See Comments)    Facial twitching and stuttering   Metolazone  Other (See Comments)    Acute renal failure   Statins Other (See Comments)    Muscle pain    Gwyndolyn Lerner, MD

## 2023-12-16 NOTE — Progress Notes (Signed)
 Patient ID: Dana [name], female   DOB: 1961/04/03, 63 y.o.   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  Patient's hemoglobin has improved to 9.2 after 3 units of packed red blood cells.  With IV antibiotics her white cell count has decreased from approximately 20-11.  Possible TEE today.  Plan for left below-knee amputation on Wednesday.  Most recent hemoglobin A1c 10.2.

## 2023-12-16 NOTE — Progress Notes (Signed)
 [facility]    Date of Admission:  12/14/2023   Total days of antibiotics 4   ID: Dana [name] is a 63 y.o. female with  MRSA bacteremia and left ankle osteomyelitis as well as concurrent E.coli pyelonephritis.  Principal Problem:   Surgical site infection Active Problems:   Chronic combined systolic (congestive) and diastolic (congestive) heart failure (HCC)   Chronic respiratory failure with hypoxia (HCC)   OSA on CPAP   Uncontrolled type 1 diabetes mellitus with hyperglycemia, with long-term current use of insulin  (HCC)   Generalized anxiety disorder   CKD stage 4 due to type 1 diabetes mellitus (HCC)   Coronary artery disease involving native coronary artery of native heart with angina pectoris (HCC)   MRSA bacteremia   Depression with anxiety   UTI (urinary tract infection)   Severe protein-calorie malnutrition (HCC)   Charcot's joint of foot in type 2 diabetes mellitus (HCC)    Subjective: Still having left ankle pain; anxious about all the medial procedures  Medications:   allopurinol   50 mg Oral Daily   aspirin  EC  81 mg Oral Daily   carvedilol   6.25 mg Oral BID WC   Chlorhexidine  Gluconate Cloth  6 each Topical Daily   diphenhydrAMINE   25 mg Oral Once   escitalopram  10 mg Oral Daily   gabapentin   300 mg Oral TID   heparin  injection (subcutaneous)  5,000 Units Subcutaneous Q8H   insulin  aspart  0-9 Units Subcutaneous Q4H   insulin  glargine-yfgn  10 Units Subcutaneous Daily   latanoprost   1 drop Both Eyes QHS   pantoprazole   40 mg Oral Daily   sodium chloride  flush  3 mL Intravenous Q12H    Objective: Vital signs in last 24 hours: Temp:  [97.7 F (36.5 C)-99.3 F (37.4 C)] 97.7 F (36.5 C) (06/02 1217) Pulse Rate:  [69-73] 69 (06/02 0933) Resp:  [17-21] 19 (06/02 0933) BP: (110-137)/(50-90) 135/50 (06/02 1217) SpO2:  [91 %-95 %] 91 % (06/02 0933)  Physical Exam  Constitutional:  oriented to person, place, and time. appears  well-developed and well-nourished. No distress.  HENT: [place]/AT, PERRLA, no scleral icterus Mouth/Throat: Oropharynx is clear and moist. No oropharyngeal exudate.  Cardiovascular: Normal rate, regular rhythm and normal heart sounds. Exam reveals no gallop and no friction rub.  No murmur heard.  Pulmonary/Chest: Effort normal and breath sounds normal. No respiratory distress.  has no wheezes.  Neck = supple, no nuchal rigidity ONG:EXBM ankle wrapped. Hx of previous toe amputations.  Skin: Skin is warm and dry. No rash noted. No erythema.  Psychiatric: a normal mood and affect.  behavior is normal.    Lab Results Recent Labs    12/15/23 0253 12/16/23 0315  WBC 14.7* 11.0*  HGB 7.5* 9.2*  HCT 23.9* 28.9*  NA 133* 133*  K 4.4 4.3  CL 101 102  CO2 21* 21*  BUN 54* 55*  CREATININE 2.28* 2.16*   Liver Panel Recent Labs    12/14/23 0214  PROT 6.5  ALBUMIN  2.1*  AST 11*  ALT 11  ALKPHOS 144*  BILITOT 0.6  BILIDIR 0.2  IBILI 0.4   Lab Results  Component Value Date   ESRSEDRATE 44 (H) 03/23/2020    Microbiology: Outside: MRSA is bactrim  R, tetra S.  Ecoli - sensitive to cefazolin  Studies/Results: NM Pulmonary Perfusion Result Date: 12/16/2023 CLINICAL DATA:  right heart failure, recent surgery, EXAM: NUCLEAR MEDICINE PERFUSION LUNG SCAN TECHNIQUE: Perfusion images were obtained  in multiple projections after intravenous injection of radiopharmaceutical. No ventilation imaging performed. RADIOPHARMACEUTICALS:  4.0 mCi Tc-43m MAA IV COMPARISON:  Chest radiographs 12/16/2023 and 12/14/2023. Nuclear medicine perfusion scans 08/27/2023 and 11/01/2016. FINDINGS: Mildly heterogeneous left perihilar perfusion, similar to previous lung scan. No new or wedge-shaped perfusion defects are demonstrated to suggest acute pulmonary embolism. The right lung perfusion appears normal. IMPRESSION: No evidence of acute pulmonary embolism on perfusion scintigraphy by PISAPED criteria. Mildly  heterogeneous left perihilar perfusion, similar to previous examination. Electronically Signed   By: Elmon Hagedorn M.D.   On: 12/16/2023 13:06   DG Chest 1 View Result Date: 12/16/2023 CLINICAL DATA:  For comparison with the 2 scan EXAM: CHEST  1 VIEW COMPARISON:  12/14/2023 FINDINGS: Cardiomegaly. No confluent airspace opacities, effusions or overt edema. No acute bony abnormality. IMPRESSION: Stable cardiomegaly.  No active disease. Electronically Signed   By: Janeece Mechanic M.D.   On: 12/16/2023 12:19   ECHOCARDIOGRAM COMPLETE Result Date: 12/15/2023    ECHOCARDIOGRAM REPORT   Patient Name:   Dana [name] Date of Exam: 12/15/2023 Medical Rec #:  [id number]      Height:       65.0 in Accession #:    [id number]     Weight:       216.0 lb Date of Birth:  [birth year]      BSA:          2.044 m Patient Age:    62 years       BP:           134/52 mmHg Patient Gender: F              HR:           76 bpm. Exam Location:  Inpatient Procedure: 2D Echo, Cardiac Doppler and Color Doppler (Both Spectral and Color            Flow Doppler were utilized during procedure). Indications:    Bacteremia  History:        Patient has prior history of Echocardiogram examinations, most                 recent 08/27/2023.  Sonographer:    Andrena Bang Referring Phys: [id number] TIMOTHY S OPYD  Sonographer Comments: Technically difficult study due to poor echo windows, suboptimal parasternal window and suboptimal subcostal window. IMPRESSIONS  1. Left ventricular ejection fraction, by estimation, is 40 to 45%. The left ventricle has mildly decreased function. The left ventricle demonstrates global hypokinesis. There is mild left ventricular hypertrophy. Left ventricular diastolic function could not be evaluated. There is the interventricular septum is flattened in systole and diastole, consistent with right ventricular pressure and volume overload.  2. Right ventricular systolic function is low normal. The right ventricular size is enlarged.  There is severely elevated pulmonary artery systolic pressure. The estimated right ventricular systolic pressure is 65.7 mmHg.  3. Left atrial size was mildly dilated.  4. S/P mitral clip, mild to moderate MR, no valvular stenosis (MG 5 mmHg, HR 72bpm). . The mean mitral valve gradient is 5.0 mmHg with average heart rate of 72 bpm.  5. Tricuspid valve regurgitation is moderate.  6. The aortic valve is tricuspid. Aortic valve regurgitation is not visualized. Aortic valve sclerosis is present, with no evidence of aortic valve stenosis.  7. Pulmonic valve regurgitation is moderate.  8. The inferior vena cava is dilated in size with <50% respiratory variability, suggesting right atrial pressure of 15 mmHg. Comparison(s): A  prior study was performed on 08/27/2023. Right ventricular size is now enlarged, RVSP suggestive of severely elevated pulmonary pressures, interventricular septum is flattened in systole and diastole, consistent with right ventricular pressure and volume overload, RAP is now . Conclusion(s)/Recommendation(s): No evidence of valvular vegetations on this transthoracic echocardiogram. Consider a transesophageal echocardiogram to exclude infective endocarditis if clinically indicated. Given the dilated RV, elevated RVSP, and low normal systolic function, rule out pulmonary embolism, if clinically indicated. FINDINGS  Left Ventricle: Left ventricular ejection fraction, by estimation, is 40 to 45%. The left ventricle has mildly decreased function. The left ventricle demonstrates global hypokinesis. Definity  contrast agent was given IV to delineate the left ventricular  endocardial borders. The left ventricular internal cavity size was normal in size. There is mild left ventricular hypertrophy. The interventricular septum is flattened in systole and diastole, consistent with right ventricular pressure and volume overload. Left ventricular diastolic function could not be evaluated due to mitral valve  repair. Left ventricular diastolic function could not be evaluated. Right Ventricle: The right ventricular size is enlarged. Right vetricular wall thickness was not well visualized. Right ventricular systolic function is low normal. There is severely elevated pulmonary artery systolic pressure. The tricuspid regurgitant velocity is 3.56 m/s, and with an assumed right atrial pressure of 15 mmHg, the estimated right ventricular systolic pressure is 65.7 mmHg. Left Atrium: Left atrial size was mildly dilated. Right Atrium: Right atrial size was not well visualized. Pericardium: There is no evidence of pericardial effusion. Mitral Valve: S/P mitral clip, mild to moderate MR, no valvular stenosis (MG 5 mmHg, HR 72bpm). MV peak gradient, 16.8 mmHg. The mean mitral valve gradient is 5.0 mmHg with average heart rate of 72 bpm. Tricuspid Valve: The tricuspid valve is normal in structure. Tricuspid valve regurgitation is moderate . No evidence of tricuspid stenosis. Aortic Valve: The aortic valve is tricuspid. Aortic valve regurgitation is not visualized. Aortic valve sclerosis is present, with no evidence of aortic valve stenosis. Aortic valve mean gradient measures 3.0 mmHg. Aortic valve peak gradient measures 6.0  mmHg. Aortic valve area, by VTI measures 1.81 cm. Pulmonic Valve: The pulmonic valve was normal in structure. Pulmonic valve regurgitation is moderate. No evidence of pulmonic stenosis. Aorta: The aortic root and ascending aorta are structurally normal, with no evidence of dilitation. Venous: The inferior vena cava is dilated in size with less than 50% respiratory variability, suggesting right atrial pressure of 15 mmHg. IAS/Shunts: The interatrial septum was not well visualized.  LEFT VENTRICLE PLAX 2D LVIDd:         5.10 cm      Diastology LVIDs:         3.50 cm      LV e' medial: 3.26 cm/s LV PW:         1.10 cm LV IVS:        1.10 cm LVOT diam:     1.80 cm LV SV:         42 LV SV Index:   20 LVOT Area:      2.54 cm  LV Volumes (MOD) LV vol d, MOD A2C: 196.0 ml LV vol d, MOD A4C: 210.0 ml LV vol s, MOD A2C: 99.5 ml LV vol s, MOD A4C: 105.0 ml LV SV MOD A2C:     96.5 ml LV SV MOD A4C:     210.0 ml LV SV MOD BP:      104.4 ml RIGHT VENTRICLE RV S prime:     9.14 cm/s TAPSE (  M-mode): 1.7 cm LEFT ATRIUM             Index LA Vol (A2C):   77.5 ml 37.91 ml/m LA Vol (A4C):   62.3 ml 30.48 ml/m LA Biplane Vol: 73.9 ml 36.15 ml/m  AORTIC VALVE AV Area (Vmax):    1.76 cm AV Area (Vmean):   1.74 cm AV Area (VTI):     1.81 cm AV Vmax:           122.00 cm/s AV Vmean:          72.300 cm/s AV VTI:            0.231 m AV Peak Grad:      6.0 mmHg AV Mean Grad:      3.0 mmHg LVOT Vmax:         84.20 cm/s LVOT Vmean:        49.300 cm/s LVOT VTI:          0.164 m LVOT/AV VTI ratio: 0.71  AORTA Ao Sinus diam: 2.90 cm Ao Asc diam:   3.10 cm MITRAL VALVE              TRICUSPID VALVE MV Area VTI:  0.82 cm    TR Peak grad:   50.7 mmHg MV Peak grad: 16.8 mmHg   TR Vmax:        356.00 cm/s MV Mean grad: 5.0 mmHg MV Vmax:      2.05 m/s    SHUNTS MV Vmean:     99.4 cm/s   Systemic VTI:  0.16 m MR Peak grad: 101.6 mmHg  Systemic Diam: 1.80 cm MR Mean grad: 65.0 mmHg MR Vmax:      504.00 cm/s MR Vmean:     378.0 cm/s Sunit Tolia Electronically signed by Olinda Bertrand Signature Date/Time: 12/15/2023/8:02:30 PM    Final    VAS US  LOWER EXTREMITY VENOUS (DVT) Result Date: 12/15/2023  Lower Venous DVT Study Patient Name:  Dana [name]  Date of Exam:   12/15/2023 Medical Rec #: [id number]       Accession #:    [id number] Date of Birth: [birth year]       Patient Gender: F Patient Age:   40 years Exam Location:  [facility] Procedure:      VAS US  LOWER EXTREMITY VENOUS (DVT) Referring Phys: DAWOOD ELGERGAWY --------------------------------------------------------------------------------  Indications: Edema.  Risk Factors: None identified. Limitations: Poor ultrasound/tissue interface and patient pain tolerance. Comparison Study: No prior studies.  Performing Technologist: Lerry Ransom RVT  Examination Guidelines: A complete evaluation includes B-mode imaging, spectral Doppler, color Doppler, and power Doppler as needed of all accessible portions of each vessel. Bilateral testing is considered an integral part of a complete examination. Limited examinations for reoccurring indications may be performed as noted. The reflux portion of the exam is performed with the patient in reverse Trendelenburg.  +---------+---------------+---------+-----------+----------+--------------+ RIGHT    CompressibilityPhasicitySpontaneityPropertiesThrombus Aging +---------+---------------+---------+-----------+----------+--------------+ CFV      Full           Yes      Yes                                 +---------+---------------+---------+-----------+----------+--------------+ SFJ      Full                                                        +---------+---------------+---------+-----------+----------+--------------+  FV Prox  Full                                                        +---------+---------------+---------+-----------+----------+--------------+ FV Mid   Full                                                        +---------+---------------+---------+-----------+----------+--------------+ FV Distal               Yes      Yes                                 +---------+---------------+---------+-----------+----------+--------------+ PFV      Full                                                        +---------+---------------+---------+-----------+----------+--------------+ POP      Full           Yes      Yes                                 +---------+---------------+---------+-----------+----------+--------------+ PTV      Full                                                        +---------+---------------+---------+-----------+----------+--------------+ PERO     Full                                                         +---------+---------------+---------+-----------+----------+--------------+   +---------+---------------+---------+-----------+----------+-------------------+ LEFT     CompressibilityPhasicitySpontaneityPropertiesThrombus Aging      +---------+---------------+---------+-----------+----------+-------------------+ CFV      Full           Yes      Yes                                      +---------+---------------+---------+-----------+----------+-------------------+ SFJ      Full                                                             +---------+---------------+---------+-----------+----------+-------------------+ FV Prox  Full                                                             +---------+---------------+---------+-----------+----------+-------------------+  FV Mid                  Yes      Yes                                      +---------+---------------+---------+-----------+----------+-------------------+ FV Distal               Yes      Yes                                      +---------+---------------+---------+-----------+----------+-------------------+ PFV      Full                                                             +---------+---------------+---------+-----------+----------+-------------------+ POP      Full           Yes      Yes                                      +---------+---------------+---------+-----------+----------+-------------------+ PTV      Full                                                             +---------+---------------+---------+-----------+----------+-------------------+ PERO                                                  Not well visualized +---------+---------------+---------+-----------+----------+-------------------+     Summary: RIGHT: - There is no evidence of deep vein thrombosis in the lower extremity. However, portions of this examination were  limited- see technologist comments above.  - No cystic structure found in the popliteal fossa.  LEFT: - There is no evidence of deep vein thrombosis in the lower extremity. However, portions of this examination were limited- see technologist comments above.  - No cystic structure found in the popliteal fossa.  *See table(s) above for measurements and observations. Electronically signed by Jimmye Moulds MD on 12/15/2023 at 12:20:09 PM.    Final    US  PELVIS (TRANSABDOMINAL ONLY) Result Date: 12/14/2023 CLINICAL DATA:  Pelvic inflammatory disease EXAM: TRANSABDOMINAL ULTRASOUND OF PELVIS TECHNIQUE: Transabdominal ultrasound examination of the pelvis was performed including evaluation of the uterus, ovaries, adnexal regions, and pelvic cul-de-sac. COMPARISON:  CT 12/14/2023 FINDINGS: Uterus Absent Right ovary Not visualized.  No definite adnexal mass identified. Left ovary Not visualized.  No definite adnexal mass identified. Other findings: Trace simple appearing free fluid within the right adnexa best seen on cine images. IMPRESSION: 1. Status post hysterectomy. 2. Nonvisualization of the ovaries bilaterally. If indicated, pelvic MRI examination with contrast may be helpful for further evaluation. 3. Trace simple appearing free fluid within the right adnexa, nonspecific. Electronically Signed  By: Worthy Heads M.D.   On: 12/14/2023 19:23   CT RENAL STONE STUDY Result Date: 12/14/2023 CLINICAL DATA:  Sepsis with bilateral pyelonephritis. Evaluate for hydronephrosis. EXAM: CT ABDOMEN AND PELVIS WITHOUT CONTRAST TECHNIQUE: Multidetector CT imaging of the abdomen and pelvis was performed following the standard protocol without IV contrast. RADIATION DOSE REDUCTION: This exam was performed according to the departmental dose-optimization program which includes automated exposure control, adjustment of the mA and/or kV according to patient size and/or use of iterative reconstruction technique. COMPARISON:  CT  abdomen and pelvis 12/13/2023. FINDINGS: Lower chest: There is atelectasis in the lung bases. There are trace bilateral pleural effusions. The heart is enlarged. Hepatobiliary: No focal liver abnormality is seen. Status post cholecystectomy. No biliary dilatation. Pancreas: Unremarkable. No pancreatic ductal dilatation or surrounding inflammatory changes. Spleen: Mildly enlarged. Adrenals/Urinary Tract: Adrenal glands are unremarkable. Kidneys are normal, without renal calculi, focal lesion, or hydronephrosis. There is mild nonspecific bilateral perinephric fat stranding, mildly increased. Bladder is unremarkable. Stomach/Bowel: Stomach is within normal limits. Appendix is not seen. No evidence of bowel wall thickening, distention, or inflammatory changes. Vascular/Lymphatic: Aortic atherosclerosis. No enlarged abdominal or pelvic lymph nodes. There are extensive vascular calcifications the abdomen and pelvis. Reproductive: Uterus is surgically absent. The left ovary appears enlarged with surrounding inflammatory stranding and fluid, a new finding. The right ovary appears within normal limits. Other: There is mild body wall edema which has increased. There is no ascites, but there is increasing presacral edema. Medication injection sites are seen in the subcutaneous tissues of the lower anterior abdominal wall. Musculoskeletal: No fracture is seen. IMPRESSION: 1. No hydronephrosis. 2. Mild nonspecific bilateral perinephric fat stranding, mildly increased. Correlate clinically for infection. 3. Enlarged left ovary with surrounding inflammatory stranding and fluid, a new finding. Findings may be related to ovarian torsion or infection. Recommend further evaluation with pelvic ultrasound. 4. Increasing body wall edema and presacral edema. 5. Trace bilateral pleural effusions. 6. Cardiomegaly. 7. Mild splenomegaly. 8. Aortic atherosclerosis. Aortic Atherosclerosis (ICD10-I70.0). Electronically Signed   By: Tyron Gallon  M.D.   On: 12/14/2023 16:54     Assessment/Plan: MRSA bacteremia with left ankle OM - continue with vancomycin  for the time being. She has anticipated amputation on Wednesday which will help with source control.  Anticipate to get TEE - to rule out endocarditis  Therapeutic drug monitoring = will check vanco level and adjust accordingly per renal function  E.coli pyelonphritis = will d/c piptazo and change to cefazolin   Pain/anxiety management = defer to primary provider  [facility] Pager: [phone]  12/16/2023, 2:19 PM

## 2023-12-16 NOTE — TOC Initial Note (Signed)
 Transition of Care [facility]) - Initial/Assessment Note    Patient Details  Name: Dana [name] MRN: [id number] Date of Birth: 12-30-1960  Transition of Care [facility]) CM/SW Contact:    Jeani Mill, RN Phone Number: 12/16/2023, 2:58 PM  Clinical Narrative:      Spoke to patient regarding transition needs. Patient lives at home with spouse and mother. Patient has all needed DME  and active with [facility] for pt, ot,rn. Plan for patient to have BKA on Wednesday and she is open to SNF post discharge. PCP confirmed.            Expected Discharge Plan: Skilled Nursing Facility Barriers to Discharge: Continued Medical Work up   Patient Goals and CMS Choice Patient states their goals for this hospitalization and ongoing recovery are:: go to rehab          Expected Discharge Plan and Services       Living arrangements for the past 2 months: Single Family Home                                      Prior Living Arrangements/Services Living arrangements for the past 2 months: Single Family Home Lives with:: Spouse, Parents Patient language and need for interpreter reviewed:: Yes Do you feel safe going back to the place where you live?: Yes      Need for Family Participation in Patient Care: Yes (Comment) Care giver support system in place?: Yes (comment)   Criminal Activity/Legal Involvement Pertinent to Current Situation/Hospitalization: No - Comment as needed  Activities of Daily Living   ADL Screening (condition at time of admission) Independently performs ADLs?: No Does the patient have a NEW difficulty with bathing/dressing/toileting/self-feeding that is expected to last >3 days?: Yes (Initiates electronic notice to provider for possible OT consult) Does the patient have a NEW difficulty with getting in/out of bed, walking, or climbing stairs that is expected to last >3 days?: Yes (Initiates electronic notice to provider for possible PT consult) Does the patient  have a NEW difficulty with communication that is expected to last >3 days?: No Is the patient deaf or have difficulty hearing?: No Does the patient have difficulty seeing, even when wearing glasses/contacts?: No Does the patient have difficulty concentrating, remembering, or making decisions?: No  Permission Sought/Granted                  Emotional Assessment Appearance:: Appears stated age   Affect (typically observed): Overwhelmed Orientation: : Oriented to Self, Oriented to  Time, Oriented to Place, Oriented to Situation Alcohol / Substance Use: Not Applicable Psych Involvement: No (comment)  Admission diagnosis:  Surgical site infection [T81.49XA] Patient Active Problem List   Diagnosis Date Noted   Severe protein-calorie malnutrition (HCC) 12/15/2023   Charcot's joint of foot in type 2 diabetes mellitus (HCC) 12/15/2023   Surgical site infection 12/14/2023   MRSA bacteremia 12/14/2023   Depression with anxiety 12/14/2023   UTI (urinary tract infection) 12/14/2023   Trimalleolar fracture of ankle, closed 11/11/2023   Situational insomnia 10/04/2023   SOB (shortness of breath) 08/27/2023   Hospital discharge follow-up 07/29/2023   Coronary artery disease involving native coronary artery of native heart with angina pectoris (HCC) 07/20/2023   Presence of heart assist device (HCC) 07/13/2023   Moderate recurrent major depression (HCC) 07/13/2023   History of amputation of hallux (HCC) 05/24/2023   Bilateral hand pain 05/14/2023  Contrast-induced nephropathy 02/20/2023   Unstable angina (HCC) 09/07/2022   Chronic idiopathic constipation 09/01/2022   Hyperphosphatemia 01/09/2022   Genetic testing 11/07/2021   Family history of breast cancer 10/23/2021   Generalized anxiety disorder 07/11/2021   Mixed hyperlipidemia 07/11/2021   S/P mitral valve clip implantation 05/18/2021   History of cardiac arrest 02/06/2021   Impaired mobility and ADLs 01/20/2021   Left-sided  weakness 01/20/2021   Drug-induced myopathy 01/03/2021   Secondary hyperparathyroidism of renal origin (HCC) 11/18/2020   Vitamin D  insufficiency 11/18/2020   Uncontrolled type 1 diabetes mellitus with hyperglycemia, with long-term current use of insulin  (HCC) 10/13/2020   CKD stage 4 due to type 1 diabetes mellitus (HCC) 10/13/2020   NSTEMI (non-ST elevated myocardial infarction) (HCC) 09/03/2020   Contrast dye induced nephropathy, possible 09/03/2020   OSA on CPAP 09/03/2020   Insulin  dependent type 2 diabetes mellitus (HCC) 09/03/2020   Need for COVID-19 vaccine 06/28/2020   Chronic respiratory failure with hypoxia (HCC) 03/23/2020   Chronic gout without tophus 03/23/2020   Pain in both hands 03/23/2020   Uncomplicated opioid dependence (HCC) 03/23/2020   Hypomagnesemia 12/21/2019   Lumbar back pain 10/05/2019   Hypertensive heart and renal disease with congestive heart failure (HCC) 09/18/2019   Chronic combined systolic (congestive) and diastolic (congestive) heart failure (HCC) 11/07/2017   Class 2 severe obesity due to excess calories with serious comorbidity and body mass index (BMI) of 39.0 to 39.9 in adult (HCC) 11/07/2017   Hyperlipidemia LDL goal <70 12/14/2016   Anemia in CKD (chronic kidney disease) 03/23/2016   Chronic pain syndrome 03/23/2016   GERD without esophagitis 03/23/2016   On home oxygen  therapy 03/23/2016   S/P ablation of atrial fibrillation 10/04/2015   Diabetic polyneuropathy associated with type 2 diabetes mellitus (HCC) 04/02/2014   PCP:  Mercy Stall, MD Pharmacy:   [facility] [address] [place] [address] Phone: [phone] Fax: [phone]  [facility] [address] [place] [address] Phone: [phone] Fax: [phone]     Social Drivers of Health (SDOH) Social History: SDOH Screenings   Food Insecurity: No Food Insecurity (12/14/2023)   Housing: Low Risk  (12/14/2023)  Transportation Needs: No Transportation Needs (12/14/2023)  Utilities: Not At Risk (12/14/2023)  Alcohol Screen: Low Risk  (10/24/2023)  Depression (PHQ2-9): High Risk (10/24/2023)  Financial Resource Strain: Low Risk  (10/24/2023)  Physical Activity: Insufficiently Active (10/24/2023)  Social Connections: Moderately Integrated (12/14/2023)  Stress: Stress Concern Present (10/24/2023)  Tobacco Use: Medium Risk (12/16/2023)  Health Literacy: Adequate Health Literacy (10/24/2023)   SDOH Interventions:     Readmission Risk Interventions    07/19/2023    3:45 PM 12/16/2022   11:54 AM 05/19/2021    1:11 PM  Readmission Risk Prevention Plan  Transportation Screening Complete Complete Complete  Medication Review (RN Care Manager) Referral to Pharmacy Referral to Pharmacy Complete  PCP or Specialist appointment within 3-5 days of discharge  Complete Complete  HRI or Home Care Consult Complete Complete Complete  SW Recovery Care/Counseling Consult Complete Complete Complete  Palliative Care Screening Not Applicable Not Applicable Not Applicable  Skilled Nursing Facility Not Applicable Not Applicable Not Applicable

## 2023-12-17 ENCOUNTER — Encounter (HOSPITAL_COMMUNITY): Payer: Self-pay

## 2023-12-17 ENCOUNTER — Other Ambulatory Visit (HOSPITAL_COMMUNITY): Payer: Self-pay

## 2023-12-17 ENCOUNTER — Telehealth (HOSPITAL_COMMUNITY): Payer: Self-pay

## 2023-12-17 ENCOUNTER — Encounter (HOSPITAL_COMMUNITY)
Admission: EM | Disposition: A | Discharge status: Other Acute Inpatient Hospital | Payer: Self-pay | Source: Other Acute Inpatient Hospital | Attending: Internal Medicine

## 2023-12-17 ENCOUNTER — Other Ambulatory Visit: Payer: Self-pay

## 2023-12-17 DIAGNOSIS — I25119 Atherosclerotic heart disease of native coronary artery with unspecified angina pectoris: Secondary | ICD-10-CM | POA: Diagnosis not present

## 2023-12-17 DIAGNOSIS — I5032 Chronic diastolic (congestive) heart failure: Secondary | ICD-10-CM

## 2023-12-17 DIAGNOSIS — J811 Chronic pulmonary edema: Secondary | ICD-10-CM

## 2023-12-17 DIAGNOSIS — B9562 Methicillin resistant Staphylococcus aureus infection as the cause of diseases classified elsewhere: Secondary | ICD-10-CM | POA: Diagnosis not present

## 2023-12-17 DIAGNOSIS — M86172 Other acute osteomyelitis, left ankle and foot: Secondary | ICD-10-CM | POA: Diagnosis not present

## 2023-12-17 DIAGNOSIS — E1022 Type 1 diabetes mellitus with diabetic chronic kidney disease: Secondary | ICD-10-CM | POA: Diagnosis not present

## 2023-12-17 DIAGNOSIS — R7881 Bacteremia: Secondary | ICD-10-CM | POA: Diagnosis not present

## 2023-12-17 DIAGNOSIS — B9620 Unspecified Escherichia coli [E. coli] as the cause of diseases classified elsewhere: Secondary | ICD-10-CM | POA: Diagnosis not present

## 2023-12-17 DIAGNOSIS — I2721 Secondary pulmonary arterial hypertension: Secondary | ICD-10-CM | POA: Insufficient documentation

## 2023-12-17 DIAGNOSIS — I5022 Chronic systolic (congestive) heart failure: Secondary | ICD-10-CM

## 2023-12-17 DIAGNOSIS — D72829 Elevated white blood cell count, unspecified: Secondary | ICD-10-CM

## 2023-12-17 LAB — BASIC METABOLIC PANEL WITH GFR
Anion gap: 9 (ref 5–15)
BUN: 52 mg/dL — ABNORMAL HIGH (ref 6–20)
CO2: 24 mmol/L (ref 22–32)
Calcium: 8.4 mg/dL — ABNORMAL LOW (ref 8.9–10.3)
Chloride: 102 mmol/L (ref 98–111)
Creatinine, Ser: 2.06 mg/dL — ABNORMAL HIGH (ref 0.44–1.00)
GFR, Estimated: 27 mL/min — ABNORMAL LOW (ref >60)
Glucose, Bld: 159 mg/dL — ABNORMAL HIGH (ref 70–99)
Potassium: 4.3 mmol/L (ref 3.5–5.1)
Sodium: 135 mmol/L (ref 135–145)

## 2023-12-17 LAB — CBC
HCT: 30 % — ABNORMAL LOW (ref 36.0–46.0)
Hemoglobin: 9.4 g/dL — ABNORMAL LOW (ref 12.0–15.0)
MCH: 26.9 pg (ref 26.0–34.0)
MCHC: 31.3 g/dL (ref 30.0–36.0)
MCV: 85.7 fL (ref 80.0–100.0)
Platelets: 278 10*3/uL (ref 150–400)
RBC: 3.5 MIL/uL — ABNORMAL LOW (ref 3.87–5.11)
RDW: 16.7 % — ABNORMAL HIGH (ref 11.5–15.5)
WBC: 14.9 10*3/uL — ABNORMAL HIGH (ref 4.0–10.5)
nRBC: 0 % (ref 0.0–0.2)

## 2023-12-17 LAB — GLUCOSE, CAPILLARY
Glucose-Capillary: 123 mg/dL — ABNORMAL HIGH (ref 70–99)
Glucose-Capillary: 125 mg/dL — ABNORMAL HIGH (ref 70–99)
Glucose-Capillary: 159 mg/dL — ABNORMAL HIGH (ref 70–99)
Glucose-Capillary: 166 mg/dL — ABNORMAL HIGH (ref 70–99)
Glucose-Capillary: 180 mg/dL — ABNORMAL HIGH (ref 70–99)
Glucose-Capillary: 185 mg/dL — ABNORMAL HIGH (ref 70–99)
Glucose-Capillary: 224 mg/dL — ABNORMAL HIGH (ref 70–99)

## 2023-12-17 SURGERY — TRANSESOPHAGEAL ECHOCARDIOGRAM (TEE) (CATHLAB)
Anesthesia: Monitor Anesthesia Care

## 2023-12-17 MED ORDER — POVIDONE-IODINE 10 % EX SWAB
2.0000 | Freq: Once | CUTANEOUS | Status: DC
Start: 2023-12-17 — End: 2023-12-18

## 2023-12-17 MED ORDER — FUROSEMIDE 10 MG/ML IJ SOLN
60.0000 mg | Freq: Two times a day (BID) | INTRAMUSCULAR | Status: DC
  Administered 2023-12-17 – 2023-12-20 (×6): 60 mg via INTRAVENOUS
  Filled 2023-12-17 (×6): qty 6

## 2023-12-17 MED ORDER — CARVEDILOL 6.25 MG PO TABS
9.3750 mg | ORAL_TABLET | Freq: Two times a day (BID) | ORAL | 3 refills | Status: DC
  Filled 2023-12-17: qty 90, 30d supply, fill #0

## 2023-12-17 MED ORDER — CEFAZOLIN SODIUM-DEXTROSE 2-4 GM/100ML-% IV SOLN
2.0000 g | INTRAVENOUS | Status: DC
Start: 2023-12-18 — End: 2023-12-19

## 2023-12-17 MED ORDER — VANCOMYCIN HCL 1250 MG/250ML IV SOLN
1250.0000 mg | INTRAVENOUS | Status: DC
  Administered 2023-12-18: 1250 mg via INTRAVENOUS
  Filled 2023-12-17: qty 250

## 2023-12-17 MED ORDER — TRANEXAMIC ACID-NACL 1000-0.7 MG/100ML-% IV SOLN
1000.0000 mg | INTRAVENOUS | Status: AC
Start: 2023-12-17 — End: 2023-12-18
  Administered 2023-12-18: 1000 mg via INTRAVENOUS
  Filled 2023-12-17 (×2): qty 100

## 2023-12-17 MED ORDER — TRANEXAMIC ACID 1000 MG/10ML IV SOLN
2000.0000 mg | INTRAVENOUS | Status: DC
  Filled 2023-12-17: qty 20

## 2023-12-17 MED ORDER — MORPHINE SULFATE ER 15 MG PO TBCR
15.0000 mg | EXTENDED_RELEASE_TABLET | Freq: Two times a day (BID) | ORAL | Status: DC
  Administered 2023-12-17 – 2023-12-18 (×3): 15 mg via ORAL
  Filled 2023-12-17 (×3): qty 1

## 2023-12-17 MED ORDER — CHLORHEXIDINE GLUCONATE 4 % EX SOLN
60.0000 mL | Freq: Once | CUTANEOUS | Status: AC
  Administered 2023-12-18: 4 via TOPICAL
  Filled 2023-12-17: qty 60

## 2023-12-17 MED ORDER — ISOSORBIDE MONONITRATE ER 30 MG PO TB24
30.0000 mg | ORAL_TABLET | Freq: Every day | ORAL | 5 refills | Status: DC
  Filled 2023-12-17: qty 30, 30d supply, fill #0

## 2023-12-17 NOTE — Progress Notes (Signed)
 OT Cancellation Note  Patient Details Name: Dana [name] MRN: [id number] DOB: 04/18/1961   Cancelled Treatment:    Reason Eval/Treat Not Completed: Patient not medically ready Pt remains pending L BKA (likely tomorrow 6/4). Will sign off for now and await new OT eval orders post op.   Shireen Dory 12/17/2023, 7:11 AM

## 2023-12-17 NOTE — Evaluation (Signed)
 Physical Therapy Evaluation Patient Details Name: Dana [name] MRN: [id number] DOB: April 02, 1961 Today's Date: 12/17/2023  History of Present Illness  63 y.o. female admitted  5/31 with Severe sepsis, present on admission  MRSA bacteremia,  Left ankle post op infection,  Bilateral pyelonephritis. PMH insulin -dependent diabetes mellitus, CKD stage IV, CAD, chronic hypoxic respiratory failure, OSA on CPAP, chronic HFmrEF, depression, anxiety, chronic pain, and left trimalleolar ankle fracture/dislocation status postoperative repair on 11/13/2023  Clinical Impression  Pt admitted with above diagnosis. PTA using knee scooter for mobility. W/c too wide to fit in home but can use out of home. Lives with husband and her mother. Husband works 12 hr shift, usually 2on/2off, alternating weekends. Awaiting Lt BKA, pushed back now until 6/4. Pt agreeable to pre-op  assessment and education. Training focused on LLE NWB to use with sit to stand and pivot transfer. Required max assist to stand from bed, min assist to stand statically with RW at EOB NWB on LT, and total assist with unsuccessful pivot due to weakness and fatigue. Hasn't been OOB since admission reportedly. Educated on LE exercises, expected precautions post-op with BKA, and dispo planning. Pt interested in CIR. Will request screen and follow, and update post-op function. Pt currently with functional limitations due to the deficits listed below (see PT Problem List). Pt will benefit from acute skilled PT to increase their independence and safety with mobility to allow discharge.           If plan is discharge home, recommend the following: Two people to help with walking and/or transfers;A lot of help with bathing/dressing/bathroom;Assistance with cooking/housework;Assist for transportation   Can travel by private vehicle        Equipment Recommendations Other (comment) (TBD, possible lightweight wheelchair and sliding board.)  Recommendations for  Other Services  Rehab consult    Functional Status Assessment Patient has had a recent decline in their functional status and demonstrates the ability to make significant improvements in function in a reasonable and predictable amount of time.     Precautions / Restrictions Precautions Precautions: Fall Recall of Precautions/Restrictions: Impaired Restrictions Weight Bearing Restrictions Per Provider Order: Yes LLE Weight Bearing Per Provider Order: Non weight bearing Other Position/Activity Restrictions: pre-emptive for post-op BKA, no formal order yet.      Mobility  Bed Mobility Overal bed mobility: Needs Assistance Bed Mobility: Supine to Sit, Sit to Supine     Supine to sit: Supervision Sit to supine: Contact guard assist   General bed mobility comments: Supervision for safety to rise to EOB, educated on technique. CGA to return to supine in bed, more effortful but able to lift LEs without help.    Transfers Overall transfer level: Needs assistance Equipment used: Rolling walker (2 wheels) Transfers: Sit to/from Stand, Bed to chair/wheelchair/BSC Sit to Stand: Mod assist Stand pivot transfers: Total assist         General transfer comment: Max assist for boost to stand. Very slow to rise, cues for LLE NWB anticipating BKA and maintained fairly well. Once upright pt able to stance with min assist for balance. Total assist for attempt to pivot to chair, unable to unload RLE enough to turn despite assist. Cues throughout, tolerated standing approx 2 min. Heavy reliance on RW at all times.    Ambulation/Gait                  Careers information officer  Tilt Bed    Modified Rankin (Stroke Patients Only)       Balance Overall balance assessment: Needs assistance Sitting-balance support: No upper extremity supported, Feet supported Sitting balance-Leahy Scale: Good     Standing balance support: Bilateral upper extremity supported,  Reliant on assistive device for balance Standing balance-Leahy Scale: Poor                               Pertinent Vitals/Pain Pain Assessment Pain Assessment: Faces Faces Pain Scale: Hurts little more Pain Location: Lt foot Pain Descriptors / Indicators: Aching Pain Intervention(s): Monitored during session, Repositioned, Limited activity within patient's tolerance    Home Living Family/patient expects to be discharged to:: Private residence Living Arrangements: Spouse/significant other;Parent Available Help at Discharge: Family;Available 24 hours/day (Husband works 12 hr shifts. only one that can assist physically.) Type of Home: House Home Access: Ramped entrance       Home Layout: Able to live on main level with bedroom/bathroom Home Equipment: Rolling Walker (2 wheels);Shower seat;Grab bars - tub/shower;Hand held shower head;Cane - single point;Wheelchair - Counsellor (4 wheels)      Prior Function Prior Level of Function : Needs assist;History of Falls (last six months)             Mobility Comments: Transfers to [facility], onto knee scooter. Not using W/c in house ADLs Comments: has been using BSC since ankle injury, needing assist for LB dressing since ankle injury but typically manages ADls and light IADLs     Extremity/Trunk Assessment   Upper Extremity Assessment Upper Extremity Assessment: Defer to OT evaluation    Lower Extremity Assessment Lower Extremity Assessment: Generalized weakness;LLE deficits/detail LLE Deficits / Details: bandaged ankle LLE: Unable to fully assess due to pain       Communication   Communication Communication: No apparent difficulties    Cognition Arousal: Alert Behavior During Therapy: WFL for tasks assessed/performed   PT - Cognitive impairments: No apparent impairments                         Following commands: Intact       Cueing Cueing Techniques: Verbal cues, Tactile cues      General Comments General comments (skin integrity, edema, etc.): VSS    Exercises General Exercises - Lower Extremity Ankle Circles/Pumps: AROM, Both, 10 reps, Supine Quad Sets: Strengthening, Both, 10 reps, Supine Gluteal Sets: Strengthening, Both, 10 reps, Supine Hip ABduction/ADduction: Strengthening, Both, 10 reps, Supine   Assessment/Plan    PT Assessment Patient needs continued PT services  PT Problem List Decreased strength;Decreased range of motion;Decreased activity tolerance;Decreased balance;Decreased mobility;Decreased knowledge of use of DME;Decreased knowledge of precautions;Cardiopulmonary status limiting activity;Impaired sensation;Obesity;Pain       PT Treatment Interventions DME instruction;Gait training;Stair training;Functional mobility training;Therapeutic activities;Therapeutic exercise;Balance training;Neuromuscular re-education;Patient/family education;Modalities    PT Goals (Current goals can be found in the Care Plan section)  Acute Rehab PT Goals Patient Stated Goal: Get well, walk again PT Goal Formulation: With patient Time For Goal Achievement: 12/31/23 Potential to Achieve Goals: Good    Frequency Min 2X/week     Co-evaluation               AM-PAC PT "6 Clicks" Mobility  Outcome Measure Help needed turning from your back to your side while in a flat bed without using bedrails?: A Little Help needed moving from lying on your back to sitting on  the side of a flat bed without using bedrails?: A Little Help needed moving to and from a bed to a chair (including a wheelchair)?: Total Help needed standing up from a chair using your arms (e.g., wheelchair or bedside chair)?: A Lot Help needed to walk in hospital room?: Total Help needed climbing 3-5 steps with a railing? : Total 6 Click Score: 11    End of Session Equipment Utilized During Treatment: Gait belt;Oxygen  Activity Tolerance: Patient tolerated treatment well (Weakness) Patient  left: in bed;with call bell/phone within reach;with bed alarm set   PT Visit Diagnosis: Unsteadiness on feet (R26.81);Muscle weakness (generalized) (M62.81);Difficulty in walking, not elsewhere classified (R26.2);Pain Pain - Right/Left: Left Pain - part of body: Ankle and joints of foot    Time: 1235-1313 PT Time Calculation (min) (ACUTE ONLY): 38 min   Charges:   PT Evaluation $PT Eval Moderate Complexity: 1 Mod PT Treatments $Therapeutic Activity: 23-37 mins PT General Charges $$ ACUTE PT VISIT: 1 Visit         Jory Ng, PT, DPT [facility]  Rehabilitation Services Physical Therapist Office: [phone] Website: [email or web address]   Alinda Irani 12/17/2023, 3:32 PM

## 2023-12-17 NOTE — Progress Notes (Signed)

## 2023-12-17 NOTE — Consult Note (Signed)
 [facility] Psychiatric Consult Initial  Patient Name: .Dana [name]  MRN: [id number]  DOB: Apr 27, 1961  Consult Order details:  Orders (From admission, onward)     Start     Ordered   12/15/23 1144  IP CONSULT TO PSYCHIATRY       Comments: This is nonurgent consult, patient is significantly depressed, as she will be going for left BKA, does appear to depressed, she is seeing a therapist as an outpatient, and is asking to talk to psychiatry to see it helps with her emotion.  Ordering Provider: Epifanio Haste, MD  Provider:  (Not yet assigned)  Question Answer Comment  Location [facility]   Reason for Consult? depression      12/15/23 1144             Mode of Visit: In person    Psychiatry Consult Evaluation  Service Date: December 17, 2023 LOS:  LOS: 3 days  Chief Complaint "Not so hot"  Primary Psychiatric Diagnoses  General anxiety disorder   Assessment  Dana [name] is a 63 y.o. female admitted: Medicallyfor 12/14/2023 12:50 AM for history significant for insulin -dependent diabetes mellitus, CKD stage IV, CAD, chronic hypoxic respiratory failure, OSA on CPAP, chronic HFmrEF, depression, anxiety, chronic pain, and left trimalleolar ankle fracture/dislocation status postoperative repair on 11/13/2023 who presented to the [facility] ED the night of 12/12/2023 with fevers, dysuria, and flank pain, history of anxiety.  Her current presentation of excessive worry, feeling overwhelmed, feelings of pending doom, and difficulty concentrating is most consistent with GAD. Current outpatient psychotropic medications include Lexapro, Ativan , gabapentin  and historically she has had a positive response to these medications. She was compliant with medications prior to admission as evidenced by self-report. On initial examination, patient pleasant and easily expressed her thoughts and concerns. Please see plan below for detailed recommendations.   Diagnoses:  Active  Hospital problems: Principal Problem:   Surgical site infection Active Problems:   Chronic combined systolic (congestive) and diastolic (congestive) heart failure (HCC)   Chronic respiratory failure with hypoxia (HCC)   OSA on CPAP   Uncontrolled type 1 diabetes mellitus with hyperglycemia, with long-term current use of insulin  (HCC)   Generalized anxiety disorder   CKD stage 4 due to type 1 diabetes mellitus (HCC)   Coronary artery disease involving native coronary artery of native heart with angina pectoris (HCC)   MRSA bacteremia   Depression with anxiety   UTI (urinary tract infection)   Severe protein-calorie malnutrition (HCC)   Charcot's joint of foot in type 2 diabetes mellitus (HCC)    Plan   ## Psychiatric Medication Recommendations:  - Continue Lexapro 10 mg daily (just started recently) - Continue gabapentin  300 mg TID  - Continue Ativan  0.5 mg BID PRN - Recommend palliative care consult to clarify preferences for full code versus DNR status. - We will continue to follow to provide supportive therapy.  ## Medical Decision Making Capacity: Not specifically addressed in this encounter  ## Further Work-up:  -- most recent EKG on 11/11/2023 had QtC of 455 -- Pertinent labwork reviewed earlier this admission includes: chem panel, CBG, EKG, urine   ## Disposition:-- There are no psychiatric contraindications to discharge at this time  ## Behavioral / Environmental: - No specific recommendations at this time.     ## Safety and Observation Level:  - Based on my clinical evaluation, I estimate the patient to be at low risk of self harm in the current setting. -  At this time, we recommend  routine. This decision is based on my review of the chart including patient's history and current presentation, interview of the patient, mental status examination, and consideration of suicide risk including evaluating suicidal ideation, plan, intent, suicidal or self-harm behaviors, risk  factors, and protective factors. This judgment is based on our ability to directly address suicide risk, implement suicide prevention strategies, and develop a safety plan while the patient is in the clinical setting. Please contact our team if there is a concern that risk level has changed.  CSSR Risk Category:C-SSRS RISK CATEGORY: No Risk  Suicide Risk Assessment: Patient has following modifiable risk factors for suicide: none which we are addressing by therapy. Patient has following non-modifiable or demographic risk factors for suicide: none Patient has the following protective factors against suicide: Access to outpatient mental health care, Supportive family, Supportive friends, Cultural, spiritual, or religious beliefs that discourage suicide, Pets in the home, Frustration tolerance, no history of suicide attempts, and no history of NSSIB  Thank you for this consult request. Recommendations have been communicated to the primary team.  We will continue to follow at this time.   Ravis Herne, MD       History of Present Illness  Relevant Aspects of [facility] Course:  Admitted on 12/14/2023 for history significant for insulin -dependent diabetes mellitus, CKD stage IV, CAD, chronic hypoxic respiratory failure, OSA on CPAP, chronic HFmrEF, depression, anxiety, chronic pain, and left trimalleolar ankle fracture/dislocation status postoperative repair on 11/13/2023 who presented to the [facility] ED the night of 12/12/2023 with fevers, dysuria, and flank pain; history of anxiety  Patient Report:   On interview, patient had recently learned her surgery to be postponed again, she appeared very down and distressed.  She was told that she was already at high risk prior to this news: "I was not scared before I came in, now I am scared."  Also notes that she is in pain and that her primary team was reviewing her pain medication regimen.  Discussed coping strategies and importance of processing  this information thoroughly, without engaging in uncontrolled, catastrophizing thinking.  Has already spoken with her daughter about this.  Does not believe anyone from her family will visit today, encouraged her to reach out to them on face time.  Is concerned that her husband will tell her to "think positive", which distresses her.  Discussed previous CODE STATUS conversations -- apparently, patient was DNR when she underwent and anoxic brain injury at [facility] and a previous hospitalization.  However, because they could not find this DNR, they resuscitated anyway, requiring life support for up to 35 hours.  Patient said that she was very scared when she woke up, as she needed three-person assist to stand up.  She is very fearful of becoming dependent on her husband for toileting, bathing and she "does not want to be a burden" to him.  Also noted financial stressors.  After her anoxic brain injury, she said that she felt pressured by her husband to revert to full code, which she has now done.  That said, her current goals of care include "improving quality of life".  No CODE STATUS changes at present.  Patient says that she does not want to be an organ donor under any circumstance.  No suicidal or homicidal ideation.  No auditory or visual hallucinations.  Would welcome palliative care visit to discuss further, preferably with husband present.  Psych ROS:  Depression: low Anxiety:  high Mania (lifetime  and current): none Psychosis: (lifetime and current): none   Review of Systems  Psychiatric/Behavioral:  The patient is nervous/anxious.      Psychiatric and Social History  Psychiatric History:  Information collected from patient and chart  Prev Dx/Sx: anxiety Current Psych Provider: none Home Meds (current): Lexapro, gabapentin , Ativan  Previous Med Trials: none Therapy: none  Prior Psych Hospitalization: none  Prior Self Harm: none Prior Violence: none  Family Psych History: none Family  Hx suicide: none  Social History:  Occupational Hx: disability Living Situation: lives with her husband Spiritual Hx: yes Access to weapons/lethal means: none   Substance History No substance abuse history  Exam Findings  Physical Exam: completed by the MD, reviewed Vital Signs:  Temp:  [97.5 F (36.4 C)-98.4 F (36.9 C)] 98.3 F (36.8 C) (06/03 0821) Pulse Rate:  [68-73] 69 (06/03 0437) Resp:  [12-21] 15 (06/03 0437) BP: (117-142)/(45-89) 117/45 (06/03 0821) SpO2:  [91 %-100 %] 100 % (06/03 0437) FiO2 (%):  [21 %] 21 % (06/03 0023) Blood pressure (!) 117/45, pulse 69, temperature 98.3 F (36.8 C), temperature source Oral, resp. rate 15, height 5\' 5"  (1.651 m), weight 98 kg, SpO2 100%. Body mass index is 35.95 kg/m.  Physical Exam Vitals and nursing note reviewed.  Constitutional:      Appearance: Normal appearance.  HENT:     Head: Normocephalic.     Nose: Nose normal.  Pulmonary:     Effort: Pulmonary effort is normal.  Musculoskeletal:     Cervical back: Normal range of motion.  Neurological:     General: No focal deficit present.     Mental Status: She is alert and oriented to person, place, and time.     Mental Status Exam: General Appearance: Casual  Orientation:  Full (Time, Place, and Person)  Memory:  Immediate;   Good Recent;   Good Remote;   Good  Concentration:  Concentration: Good and Attention Span: Good  Recall:  Good  Attention  Good  Eye Contact:  Fair  Speech:  Clear and Coherent  Language:  Good  Volume:  Normal  Mood: Depressed  Affect:  Congruent  Thought Process:  Coherent  Thought Content:  logical  Suicidal Thoughts:  No  Homicidal Thoughts:  No  Judgement:  Good  Insight:  Good  Psychomotor Activity:  Decreased  Akathisia:  No  Fund of Knowledge:  Good      Assets:  Communication Skills Desire for Improvement Housing Intimacy Leisure Time Resilience Social Support  Cognition:  WNL  ADL's:  Impaired  AIMS (if  indicated):        Other History   These have been pulled in through the EMR, reviewed, and updated if appropriate.  Family History:  The patient's family history includes Bipolar disorder in an other family member; Breast cancer (age of onset: 47) in her mother; Cancer in an other family member; Coronary artery disease in her mother; Depression in an other family member; Diabetes in her father; Diabetes type II in her mother; Hyperlipidemia in her mother; Hypertension in her father and mother; Lung cancer (age of onset: 84) in her mother; Mental illness in her sister; Schizophrenia in an other family member.  Medical History: Past Medical History:  Diagnosis Date   Acquired absence of left great toe (HCC)    Acquired absence of other left toe(s) (HCC)    ACS (acute coronary syndrome) (HCC)    Acute bronchitis due to COVID-19 virus 03/03/2023   Acute on  chronic combined systolic and diastolic CHF (congestive heart failure) (HCC) 09/16/2023   Acute respiratory failure with hypoxia (HCC) 07/20/2023   Anemia    Anoxic brain injury (HCC)    Anxiety    CAD (coronary artery disease)    DES mLAD 04/07/15; DES dRCA & DES pPDA 08/30/16; DES mCX & PTCA OM1 09/29/20   Chronic diastolic (congestive) heart failure (HCC)    Chronic pain    Chronic systolic (congestive) heart failure (HCC)    CKD (chronic kidney disease)    Contrast dye induced nephropathy, possible 09/03/2020   COPD (chronic obstructive pulmonary disease) (HCC)    COVID-19 virus infection 03/03/2023   Demand ischemia (HCC)    Diabetes (HCC)type 1    Diastolic CHF (HCC)    Dyspnea    Encounter for screening mammogram for malignant neoplasm of breast 09/16/2023   Family history of breast cancer 10/23/2021   Gastro-esophageal reflux disease without esophagitis    Hyperlipidemia    Hypertension    Hypertensive heart disease with heart failure (HCC)    Hypoxemia    Idiopathic gout, multiple sites    Leukocytosis 03/29/2022    Localized edema    Low back pain    Mitral regurgitation    Mitral regurgitation    Mixed hyperlipidemia    Mixed incontinence    Morbid (severe) obesity due to excess calories (HCC)    Neuromuscular disorder (HCC)    Neuropathy    Non-ST elevation (NSTEMI) myocardial infarction (HCC)    08/29/20, 09/29/20   OSA (obstructive sleep apnea) 09/03/2020   Other specified hypothyroidism    PAF (paroxysmal atrial fibrillation) (HCC)    s/p pulmonary vein isolation by cryoablation 10/04/15 Dr. Jearldine Mina [facility]   PEA (Pulseless electrical activity) [facility])    ~ 01/13/21 at [facility] during admission DKA, volume overload, possible CAP, hypoxia s/p ACLS with ROSC ~ 10-15   Pneumonia    Primary insomnia    Rheumatoid arthritis (HCC)    Stroke (HCC)    Thyroid  disease    Type 1 diabetes mellitus (HCC)    URI with cough and congestion 08/22/2023   Vitamin D  deficiency     Surgical History: Past Surgical History:  Procedure Laterality Date   ABDOMINAL HYSTERECTOMY     Partial- Still has both ovaries   ANKLE FUSION Left 11/13/2023   Procedure: HINDFOOT FUSION FIXATION OF ANKLE;  Surgeon: Laneta Pintos, MD;  Location: [facility] OR;  Service: Orthopedics;  Laterality: Left;   CARDIOVASCULAR STRESS TEST  08/13/2014   Nuclear; Normal   CARPAL TUNNEL RELEASE     CATARACT EXTRACTION, BILATERAL     CESAREAN SECTION     CHOLECYSTECTOMY     CORONARY ANGIOPLASTY WITH STENT PLACEMENT     3 blockages/ 1 stent   CORONARY BALLOON ANGIOPLASTY N/A 07/18/2023   Procedure: CORONARY BALLOON ANGIOPLASTY;  Surgeon: Odie Benne, MD;  Location: [facility] INVASIVE CV LAB;  Service: Cardiovascular;  Laterality: N/A;   CORONARY PRESSURE/FFR STUDY N/A 09/07/2022   Procedure: INTRAVASCULAR PRESSURE WIRE/FFR STUDY;  Surgeon: Swaziland, Peter M, MD;  Location: [facility] INVASIVE CV LAB;  Service: Cardiovascular;  Laterality: N/A;   CORONARY STENT INTERVENTION N/A 09/29/2020   Procedure: CORONARY STENT INTERVENTION;   Surgeon: Arnoldo Lapping, MD;  Location: [facility] INVASIVE CV LAB;  Service: Cardiovascular;  Laterality: N/A;  CFX   CORONARY STENT INTERVENTION N/A 09/07/2022   Procedure: CORONARY STENT INTERVENTION;  Surgeon: Swaziland, Peter M, MD;  Location: [facility] INVASIVE CV LAB;  Service: Cardiovascular;  Laterality: N/A;   CORONARY STENT INTERVENTION N/A 02/13/2023   Procedure: CORONARY STENT INTERVENTION;  Surgeon: Odie Benne, MD;  Location: [facility] INVASIVE CV LAB;  Service: Cardiovascular;  Laterality: N/A;  LAD   CORONARY ULTRASOUND/IVUS N/A 02/13/2023   Procedure: Coronary Ultrasound/IVUS;  Surgeon: Odie Benne, MD;  Location: [facility] INVASIVE CV LAB;  Service: Cardiovascular;  Laterality: N/A;   CORONARY/GRAFT ACUTE MI REVASCULARIZATION N/A 12/08/2022   Procedure: Coronary/Graft Acute MI Revascularization;  Surgeon: Arleen Lacer, MD;  Location: [facility] INVASIVE CV LAB;  Service: Cardiovascular;  Laterality: N/A;   EYE SURGERY     LEFT HEART CATH AND CORONARY ANGIOGRAPHY N/A 09/29/2020   Procedure: LEFT HEART CATH AND CORONARY ANGIOGRAPHY;  Surgeon: Arnoldo Lapping, MD;  Location: [facility] INVASIVE CV LAB;  Service: Cardiovascular;  Laterality: N/A;   LEFT HEART CATH AND CORONARY ANGIOGRAPHY N/A 09/07/2022   Procedure: LEFT HEART CATH AND CORONARY ANGIOGRAPHY;  Surgeon: Swaziland, Peter M, MD;  Location: [facility] INVASIVE CV LAB;  Service: Cardiovascular;  Laterality: N/A;   LEFT HEART CATH AND CORONARY ANGIOGRAPHY N/A 12/08/2022   Procedure: LEFT HEART CATH AND CORONARY ANGIOGRAPHY;  Surgeon: Arleen Lacer, MD;  Location: [facility] INVASIVE CV LAB;  Service: Cardiovascular;  Laterality: N/A;   LEFT HEART CATH AND CORONARY ANGIOGRAPHY N/A 02/13/2023   Procedure: LEFT HEART CATH AND CORONARY ANGIOGRAPHY;  Surgeon: Odie Benne, MD;  Location: [facility] INVASIVE CV LAB;  Service: Cardiovascular;  Laterality: N/A;   LEFT HEART CATH AND CORONARY ANGIOGRAPHY N/A 07/16/2023   Procedure: LEFT HEART CATH AND CORONARY  ANGIOGRAPHY;  Surgeon: Odie Benne, MD;  Location: [facility] INVASIVE CV LAB;  Service: Cardiovascular;  Laterality: N/A;   RIGHT HEART CATH N/A 04/27/2021   Procedure: RIGHT HEART CATH;  Surgeon: Mardell Shade, MD;  Location: [facility] INVASIVE CV LAB;  Service: Cardiovascular;  Laterality: N/A;   RIGHT/LEFT HEART CATH AND CORONARY ANGIOGRAPHY N/A 01/07/2017   Procedure: Right/Left Heart Cath and Coronary Angiography;  Surgeon: Darlis Eisenmenger, MD;  Location: [facility] INVASIVE CV LAB;  Service: Cardiovascular;  Laterality: N/A;   ROTATOR CUFF REPAIR     TEE WITHOUT CARDIOVERSION N/A 04/28/2021   Procedure: TRANSESOPHAGEAL ECHOCARDIOGRAM (TEE);  Surgeon: Mardell Shade, MD;  Location: [facility] ENDOSCOPY;  Service: Cardiovascular;  Laterality: N/A;   TEE WITHOUT CARDIOVERSION N/A 05/18/2021   Procedure: TRANSESOPHAGEAL ECHOCARDIOGRAM (TEE);  Surgeon: Thukkani, Arun K, MD;  Location: [facility] INVASIVE CV LAB;  Service: Cardiovascular;  Laterality: N/A;   TOE AMPUTATION Left    TRANSCATHETER MITRAL EDGE TO EDGE REPAIR N/A 05/18/2021   Procedure: MITRAL VALVE REPAIR;  Surgeon: Kyra Phy, MD;  Location: [facility] INVASIVE CV LAB;  Service: Cardiovascular;  Laterality: N/A;   US  ECHOCARDIOGRAPHY  05/2017   Normal     Medications:   Current Facility-Administered Medications:    acetaminophen  (TYLENOL ) tablet 650 mg, 650 mg, Oral, Q6H PRN, 650 mg at 12/16/23 1303 **OR** acetaminophen  (TYLENOL ) suppository 650 mg, 650 mg, Rectal, Q6H PRN, Opyd, Santana Cue, MD   allopurinol  (ZYLOPRIM ) tablet 50 mg, 50 mg, Oral, Daily, Opyd, Timothy S, MD, 50 mg at 12/16/23 1257   aspirin  EC tablet 81 mg, 81 mg, Oral, Daily, Elgergawy, Dawood S, MD, 81 mg at 12/15/23 1610   carvedilol  (COREG ) tablet 6.25 mg, 6.25 mg, Oral, BID WC, Elgergawy, Dawood S, MD, 6.25 mg at 12/16/23 1722   ceFAZolin  (ANCEF ) IVPB 1 g/50 mL premix, 1 g, Intravenous, Q8H, Liane Redman, MD, Last Rate: 100 mL/hr at  12/17/23 0654, 1 g at 12/17/23 0654    Chlorhexidine  Gluconate Cloth 2 % PADS 6 each, 6 each, Topical, Daily, Elgergawy, Dawood S, MD, 6 each at 12/15/23 0818   diphenhydrAMINE  (BENADRYL ) capsule 25 mg, 25 mg, Oral, Once, Elgergawy, Dawood S, MD   escitalopram (LEXAPRO) tablet 10 mg, 10 mg, Oral, Daily, Elgergawy, Dawood S, MD, 10 mg at 12/16/23 1257   furosemide  (LASIX ) injection 60 mg, 60 mg, Intravenous, BID, Elgergawy, Dawood S, MD   gabapentin  (NEURONTIN ) capsule 300 mg, 300 mg, Oral, TID, Opyd, Timothy S, MD, 300 mg at 12/16/23 2150   heparin  injection 5,000 Units, 5,000 Units, Subcutaneous, Q8H, Elgergawy, Ardia Kraft, MD, 5,000 Units at 12/17/23 0510   HYDROmorphone  (DILAUDID ) injection 0.5 mg, 0.5 mg, Intravenous, Q4H PRN, Elgergawy, Ardia Kraft, MD, 0.5 mg at 12/17/23 0510   insulin  aspart (novoLOG ) injection 0-9 Units, 0-9 Units, Subcutaneous, Q4H, Opyd, Timothy S, MD, 2 Units at 12/17/23 8657   insulin  glargine-yfgn (SEMGLEE ) injection 10 Units, 10 Units, Subcutaneous, Daily, Opyd, Timothy S, MD, 10 Units at 12/15/23 1205   latanoprost  (XALATAN ) 0.005 % ophthalmic solution 1 drop, 1 drop, Both Eyes, QHS, Elgergawy, Dawood S, MD, 1 drop at 12/16/23 2151   LORazepam  (ATIVAN ) tablet 0.5 mg, 0.5 mg, Oral, QID PRN, Elgergawy, Ardia Kraft, MD, 0.5 mg at 12/16/23 0248   oxyCODONE  (Oxy IR/ROXICODONE ) immediate release tablet 5 mg, 5 mg, Oral, Q4H PRN, Opyd, Timothy S, MD, 5 mg at 12/17/23 8469   pantoprazole  (PROTONIX ) EC tablet 40 mg, 40 mg, Oral, Daily, Opyd, Timothy S, MD, 40 mg at 12/16/23 1257   prochlorperazine  (COMPAZINE ) injection 5 mg, 5 mg, Intravenous, Q6H PRN, Opyd, Timothy S, MD, 5 mg at 12/15/23 1649   senna (SENOKOT) tablet 8.6 mg, 1 tablet, Oral, Daily PRN, Opyd, Santana Cue, MD, 8.6 mg at 12/16/23 1720   sodium chloride  flush (NS) 0.9 % injection 10-40 mL, 10-40 mL, Intracatheter, PRN, Elgergawy, Dawood S, MD   sodium chloride  flush (NS) 0.9 % injection 3 mL, 3 mL, Intravenous, Q12H, Opyd, Timothy S, MD, 3 mL at 12/16/23 2151    Vancomycin  (VANCOCIN ) 1,250 mg in sodium chloride  0.9 % 250 mL IVPB, 1,250 mg, Intravenous, Q48H, Adaline Ada, RPH, Last Rate: 166.7 mL/hr at 12/16/23 0541, 1,250 mg at 12/16/23 0541  Allergies: Allergies  Allergen Reactions   Naproxen Hives and Rash   Shellfish-Derived Products Hives, Swelling and Other (See Comments)    Angioedema    Strawberry (Diagnostic) Hives   Celecoxib Other (See Comments)    Stomach pain   Doxepin  Hcl Other (See Comments)    Lethargic, sleeping a lot per pt and family   Glucosamine Hives, Swelling and Other (See Comments)    Angioedema    Iron  Hives and Other (See Comments)    IV iron  transfusion - hives - Feb 2022 hospitalization  Patient said she CAN tolerate if given Benadryl  first   Metoclopramide Other (See Comments)    Facial twitching and stuttering   Metolazone  Other (See Comments)    Acute renal failure   Statins Other (See Comments)    Muscle pain    Gwyndolyn Lerner, MD

## 2023-12-17 NOTE — Progress Notes (Signed)
 PROGRESS NOTE    Dana [name]  XLK:[id number] DOB: February 19, 1961 DOA: 12/14/2023 PCP: Mercy Stall, MD    No chief complaint on file.   Brief Narrative:   Dana [name] is a pleasant 63 y.o. female with medical history significant for insulin -dependent diabetes mellitus, CKD stage IV, CAD, chronic hypoxic respiratory failure, OSA on CPAP, chronic HFmrEF, depression, anxiety, chronic pain, and left trimalleolar ankle fracture/dislocation status postoperative repair on 11/13/2023 who presented to the [facility] ED the night of 12/12/2023 with fevers, dysuria, and flank pain.  Patient workup in [facility] ED concerning for pyelonephritis, then further workup reveals MRSA bacteremia, in the setting of patient right ankle surgery call site infection, she was transitioned to [facility] for further evaluation.  She was seen by orthopedic, recommendation for left BKA, plan on 12/17/2023, cardiology were consulted for right heart failure and preop evaluation.  Assessment & Plan:   Principal Problem:   Surgical site infection Active Problems:   Chronic combined systolic (congestive) and diastolic (congestive) heart failure (HCC)   Chronic respiratory failure with hypoxia (HCC)   OSA on CPAP   Uncontrolled type 1 diabetes mellitus with hyperglycemia, with long-term current use of insulin  (HCC)   Generalized anxiety disorder   CKD stage 4 due to type 1 diabetes mellitus (HCC)   Coronary artery disease involving native coronary artery of native heart with angina pectoris (HCC)   MRSA bacteremia   Depression with anxiety   UTI (urinary tract infection)   Severe protein-calorie malnutrition (HCC)   Charcot's joint of foot in type 2 diabetes mellitus (HCC)  Severe sepsis, present on admission MRSA bacteremia Left ankle post op infection Bilateral pyelonephritis - Severe sepsis present on admission when she is at [facility], persist during this hospital stay as well - 2 sets of  cultures x 2 bottles growing gram-positive cocci, MRSA by PCR at [facility]. - Surveillance blood cultures obtained here on admission remain negative - recent left ankle fracture repair, site appears infected, draining purulent discharge, ortho consulted.  Recommendation for BKA - Continue with IV vancomycin  for MRSA bacteremia. - CT abdomen pelvis significant for bilateral pyelonephritis, questionable finding in right ovary concerning for infection versus torsion, pelvic ultrasound has been obtained , broadened Rocephin  to IV Zosyn for better coverage, back to cefazolin  . - Drain discontinued that hypotension has resolved -  right IJ central line discontinued on admission. - Charcot collapse of left ankle with failed internal fixation with cellulitis and ulceration with peripheral vascular disease and uncontrolled diabetes mellitus, recommendation for left BKA, plan for surgery on Wednesday - TEE, timing per cardiology, awaiting further diuresis and optimization as currently she remains high risk  CAD  Elevated troponins -She has had extensive CAD with prior PCI's and multiple evidence of in-stent restenosis in the proximal LAD with last intervention January 2025.   - scoring balloon angioplasty mid LAD 07/2023 -troponins were elevated at the hospital, 0.2, 0.18, 0.16, (the normal range is 0.04), this is secondary to sepsis, and CKD at baseline, no anginal symptoms. -She is on aspirin  and Brilinta , with recent balloon angioplasty, continue with aspirin , hold Brilinta  in anticipation for surgery.  Will resume Brilinta  postoperatively once cleared by orthopedic.. - Now blood pressure has improved will resume on Coreg .   Chronic HFmrEF  Right ventricular pressure elevation, volume overload - EF was 40-45% in February 2025  - Appears compensated; diuretic held and IVF given in setting of sepsis  - Repeat 2D echo with a  EF of the same 40 to 45%, global hypokinesis, but it does show  significant right heart enlargement with elevation of the R VSP. - Home Coreg  as blood pressure improved - Appreciate cardiology input for preop optimization - venous  Dopplers negative for DVT ,VQ scan with no evidence of acute PE - Continue with IV diuresis as tolerated as right ventricle blood pressure elevation thought secondary to volume overload.   CKD IV  - Appears close to baseline  - Renally-dose medications, monitor      Insulin -dependent DM  - A1c was 10.2% in April 2025  - Check CBGs, continue long- and short-acting insulin     OSA; chronic hypoxic respiratory failure  - Continue supplemental O2, continue CPAP while sleeping    Depression, anxiety  - Continue doxepin  and low-dose Ativan  as-needed    Anemia of chronic kidney disease -Hemoglobin around baseline at 7.2, and her history of CAD and need for surgery she transfused 1 unit PRBC yesterday, another 2 units ordered today orthopedic in anticipation of her surgery given her cardiac history.   Depression/anxiety - psychiatry input greatly appreciated - Patient's feels overwhelmed by recent multiple hospitalization and her deteriorating health, reports she wanted to be DNR in the past but secondary to family requests she decided to continue with full code and current measures, will consult palliative medicine.      DVT prophylaxis: Heparin  Code Status: Full Family Communication: discussed with husband at mother 6/2 Disposition:   Status is: Inpatient   The patient is critically ill with multi-organ failure.  Critical care was necessary to treat or prevent imminent or life-threatening deterioration of severe sepsis, chronic respiratory failure, chronic cardiac failure, chronic renal failure and was exclusive of separately billable procedures and treating other patients. Total critical care time spent by me: 40 mnutes Time spent personally by me on obtaining history from patient or surrogate, evaluation of the  patient, evaluation of patient's response to treatment, ordering and review of laboratory studies, ordering and review of radiographic studies, ordering and performing treatments and interventions, and re-evaluation of the patient's condition.   Consultants:  Orthopedic ID will be consulted  Subjective:  Reports dyspnea at baseline, remains afebrile, reports pain at left ankle.  Objective: Vitals:   12/16/23 2335 12/17/23 0437 12/17/23 0821 12/17/23 1141  BP:  (!) 142/58 (!) 117/45 (!) 133/55  Pulse:  69    Resp:  15    Temp: 98.1 F (36.7 C) 97.7 F (36.5 C) 98.3 F (36.8 C) 97.8 F (36.6 C)  TempSrc: Oral Oral Oral Oral  SpO2:  100%    Weight:      Height:        Intake/Output Summary (Last 24 hours) at 12/17/2023 1434 Last data filed at 12/17/2023 1330 Gross per 24 hour  Intake 100 ml  Output 3475 ml  Net -3375 ml   Filed Weights   12/14/23 0100 12/14/23 0200  Weight: 103.3 kg 98 kg    Examination:  Awake Alert, Oriented X 3, frail, anxious, intermittently tearful Symmetrical Chest wall movement, Good air movement bilaterally, CTAB RRR,No Gallops,Rubs or new Murmurs, No Parasternal Heave +ve B.Sounds, Abd Soft, bilateral CVA tenderness Left foot/ankle bandaged Awake Alert, Oriented X 3, frail. Symmetrical Chest wall movement, Good air movement bilaterally, CTAB RRR,No Gallops,Rubs or new Murmurs, No Parasternal Heave +ve B.Sounds, Abd Soft, No tenderness, No rebound - guarding or rigidity. No Cyanosis, + edema, left ankle wrapped.   Data Reviewed: I have personally reviewed following labs and  imaging studies  CBC: Recent Labs  Lab 12/14/23 0214 12/15/23 0253 12/16/23 0315 12/17/23 0409  WBC 19.6* 14.7* 11.0* 14.9*  HGB 7.2* 7.5* 9.2* 9.4*  HCT 23.5* 23.9* 28.9* 30.0*  MCV 88.3 87.5 85.0 85.7  PLT 265 255 250 278    Basic Metabolic Panel: Recent Labs  Lab 12/14/23 0214 12/15/23 0253 12/16/23 0315 12/17/23 0409  NA 134* 133* 133* 135  K 4.3  4.4 4.3 4.3  CL 98 101 102 102  CO2 22 21* 21* 24  GLUCOSE 137* 134* 111* 159*  BUN 50* 54* 55* 52*  CREATININE 2.25* 2.28* 2.16* 2.06*  CALCIUM  8.4* 8.1* 8.1* 8.4*  MG 2.1  --   --   --     GFR: Estimated Creatinine Clearance: 33.7 mL/min (A) (by C-G formula based on SCr of 2.06 mg/dL (H)).  Liver Function Tests: Recent Labs  Lab 12/14/23 0214  AST 11*  ALT 11  ALKPHOS 144*  BILITOT 0.6  PROT 6.5  ALBUMIN  2.1*    CBG: Recent Labs  Lab 12/16/23 2331 12/17/23 0407 12/17/23 0751 12/17/23 0828 12/17/23 1138  GLUCAP 214* 159* 123* 166* 185*     Recent Results (from the past 240 hours)  Urine Culture (for pregnant, neutropenic or urologic patients or patients with an indwelling urinary catheter)     Status: Abnormal   Collection Time: 12/14/23  1:56 AM   Specimen: Urine, Clean Catch  Result Value Ref Range Status   Specimen Description URINE, CLEAN CATCH  Final   Special Requests NONE  Final   Culture (A)  Final    <10,000 COLONIES/mL INSIGNIFICANT GROWTH Performed at [facility], [address] [place], [place] [address]    Report Status 12/15/2023 FINAL  Final  Culture, blood (x 2)     Status: None (Preliminary result)   Collection Time: 12/14/23  2:14 AM   Specimen: BLOOD RIGHT ARM  Result Value Ref Range Status   Specimen Description BLOOD RIGHT ARM  Final   Special Requests   Final    BOTTLES DRAWN AEROBIC AND ANAEROBIC Blood Culture adequate volume   Culture   Final    NO GROWTH 3 DAYS Performed at [facility], [address], [place], [place] [address]    Report Status PENDING  Incomplete  Culture, blood (x 2)     Status: None (Preliminary result)   Collection Time: 12/14/23  2:14 AM   Specimen: BLOOD RIGHT HAND  Result Value Ref Range Status   Specimen Description BLOOD RIGHT HAND  Final   Special Requests   Final    BOTTLES DRAWN AEROBIC AND ANAEROBIC Blood Culture adequate volume   Culture   Final    NO GROWTH 3 DAYS Performed  at [facility], [address] [place], [place] [address]    Report Status PENDING  Incomplete         Radiology Studies: NM Pulmonary Perfusion Result Date: 12/16/2023 CLINICAL DATA:  right heart failure, recent surgery, EXAM: NUCLEAR MEDICINE PERFUSION LUNG SCAN TECHNIQUE: Perfusion images were obtained in multiple projections after intravenous injection of radiopharmaceutical. No ventilation imaging performed. RADIOPHARMACEUTICALS:  4.0 mCi Tc-32m MAA IV COMPARISON:  Chest radiographs 12/16/2023 and 12/14/2023. Nuclear medicine perfusion scans 08/27/2023 and 11/01/2016. FINDINGS: Mildly heterogeneous left perihilar perfusion, similar to previous lung scan. No new or wedge-shaped perfusion defects are demonstrated to suggest acute pulmonary embolism. The right lung perfusion appears normal. IMPRESSION: No evidence of acute pulmonary embolism on perfusion  scintigraphy by PISAPED criteria. Mildly heterogeneous left perihilar perfusion, similar to previous examination. Electronically Signed   By: Elmon Hagedorn M.D.   On: 12/16/2023 13:06   DG Chest 1 View Result Date: 12/16/2023 CLINICAL DATA:  For comparison with the 2 scan EXAM: CHEST  1 VIEW COMPARISON:  12/14/2023 FINDINGS: Cardiomegaly. No confluent airspace opacities, effusions or overt edema. No acute bony abnormality. IMPRESSION: Stable cardiomegaly.  No active disease. Electronically Signed   By: Janeece Mechanic M.D.   On: 12/16/2023 12:19   ECHOCARDIOGRAM COMPLETE Result Date: 12/15/2023    ECHOCARDIOGRAM REPORT   Patient Name:   RYNLEE LISBON Date of Exam: 12/15/2023 Medical Rec #:  [id number]      Height:       65.0 in Accession #:    [id number]     Weight:       216.0 lb Date of Birth:  [birth year]      BSA:          2.044 m Patient Age:    62 years       BP:           134/52 mmHg Patient Gender: F              HR:           76 bpm. Exam Location:  Inpatient Procedure: 2D Echo, Cardiac Doppler and Color Doppler (Both Spectral and Color             Flow Doppler were utilized during procedure). Indications:    Bacteremia  History:        Patient has prior history of Echocardiogram examinations, most                 recent 08/27/2023.  Sonographer:    Andrena Bang Referring Phys: [id number] TIMOTHY S OPYD  Sonographer Comments: Technically difficult study due to poor echo windows, suboptimal parasternal window and suboptimal subcostal window. IMPRESSIONS  1. Left ventricular ejection fraction, by estimation, is 40 to 45%. The left ventricle has mildly decreased function. The left ventricle demonstrates global hypokinesis. There is mild left ventricular hypertrophy. Left ventricular diastolic function could not be evaluated. There is the interventricular septum is flattened in systole and diastole, consistent with right ventricular pressure and volume overload.  2. Right ventricular systolic function is low normal. The right ventricular size is enlarged. There is severely elevated pulmonary artery systolic pressure. The estimated right ventricular systolic pressure is 65.7 mmHg.  3. Left atrial size was mildly dilated.  4. S/P mitral clip, mild to moderate MR, no valvular stenosis (MG 5 mmHg, HR 72bpm). . The mean mitral valve gradient is 5.0 mmHg with average heart rate of 72 bpm.  5. Tricuspid valve regurgitation is moderate.  6. The aortic valve is tricuspid. Aortic valve regurgitation is not visualized. Aortic valve sclerosis is present, with no evidence of aortic valve stenosis.  7. Pulmonic valve regurgitation is moderate.  8. The inferior vena cava is dilated in size with <50% respiratory variability, suggesting right atrial pressure of 15 mmHg. Comparison(s): A prior study was performed on 08/27/2023. Right ventricular size is now enlarged, RVSP suggestive of severely elevated pulmonary pressures, interventricular septum is flattened in systole and diastole, consistent with right ventricular pressure and volume overload, RAP is now .  Conclusion(s)/Recommendation(s): No evidence of valvular vegetations on this transthoracic echocardiogram. Consider a transesophageal echocardiogram to exclude infective endocarditis if clinically indicated. Given the dilated RV, elevated RVSP, and low normal systolic function, rule  out pulmonary embolism, if clinically indicated. FINDINGS  Left Ventricle: Left ventricular ejection fraction, by estimation, is 40 to 45%. The left ventricle has mildly decreased function. The left ventricle demonstrates global hypokinesis. Definity  contrast agent was given IV to delineate the left ventricular  endocardial borders. The left ventricular internal cavity size was normal in size. There is mild left ventricular hypertrophy. The interventricular septum is flattened in systole and diastole, consistent with right ventricular pressure and volume overload. Left ventricular diastolic function could not be evaluated due to mitral valve repair. Left ventricular diastolic function could not be evaluated. Right Ventricle: The right ventricular size is enlarged. Right vetricular wall thickness was not well visualized. Right ventricular systolic function is low normal. There is severely elevated pulmonary artery systolic pressure. The tricuspid regurgitant velocity is 3.56 m/s, and with an assumed right atrial pressure of 15 mmHg, the estimated right ventricular systolic pressure is 65.7 mmHg. Left Atrium: Left atrial size was mildly dilated. Right Atrium: Right atrial size was not well visualized. Pericardium: There is no evidence of pericardial effusion. Mitral Valve: S/P mitral clip, mild to moderate MR, no valvular stenosis (MG 5 mmHg, HR 72bpm). MV peak gradient, 16.8 mmHg. The mean mitral valve gradient is 5.0 mmHg with average heart rate of 72 bpm. Tricuspid Valve: The tricuspid valve is normal in structure. Tricuspid valve regurgitation is moderate . No evidence of tricuspid stenosis. Aortic Valve: The aortic valve is tricuspid.  Aortic valve regurgitation is not visualized. Aortic valve sclerosis is present, with no evidence of aortic valve stenosis. Aortic valve mean gradient measures 3.0 mmHg. Aortic valve peak gradient measures 6.0  mmHg. Aortic valve area, by VTI measures 1.81 cm. Pulmonic Valve: The pulmonic valve was normal in structure. Pulmonic valve regurgitation is moderate. No evidence of pulmonic stenosis. Aorta: The aortic root and ascending aorta are structurally normal, with no evidence of dilitation. Venous: The inferior vena cava is dilated in size with less than 50% respiratory variability, suggesting right atrial pressure of 15 mmHg. IAS/Shunts: The interatrial septum was not well visualized.  LEFT VENTRICLE PLAX 2D LVIDd:         5.10 cm      Diastology LVIDs:         3.50 cm      LV e' medial: 3.26 cm/s LV PW:         1.10 cm LV IVS:        1.10 cm LVOT diam:     1.80 cm LV SV:         42 LV SV Index:   20 LVOT Area:     2.54 cm  LV Volumes (MOD) LV vol d, MOD A2C: 196.0 ml LV vol d, MOD A4C: 210.0 ml LV vol s, MOD A2C: 99.5 ml LV vol s, MOD A4C: 105.0 ml LV SV MOD A2C:     96.5 ml LV SV MOD A4C:     210.0 ml LV SV MOD BP:      104.4 ml RIGHT VENTRICLE RV S prime:     9.14 cm/s TAPSE (M-mode): 1.7 cm LEFT ATRIUM             Index LA Vol (A2C):   77.5 ml 37.91 ml/m LA Vol (A4C):   62.3 ml 30.48 ml/m LA Biplane Vol: 73.9 ml 36.15 ml/m  AORTIC VALVE AV Area (Vmax):    1.76 cm AV Area (Vmean):   1.74 cm AV Area (VTI):     1.81 cm AV Vmax:  122.00 cm/s AV Vmean:          72.300 cm/s AV VTI:            0.231 m AV Peak Grad:      6.0 mmHg AV Mean Grad:      3.0 mmHg LVOT Vmax:         84.20 cm/s LVOT Vmean:        49.300 cm/s LVOT VTI:          0.164 m LVOT/AV VTI ratio: 0.71  AORTA Ao Sinus diam: 2.90 cm Ao Asc diam:   3.10 cm MITRAL VALVE              TRICUSPID VALVE MV Area VTI:  0.82 cm    TR Peak grad:   50.7 mmHg MV Peak grad: 16.8 mmHg   TR Vmax:        356.00 cm/s MV Mean grad: 5.0 mmHg MV Vmax:       2.05 m/s    SHUNTS MV Vmean:     99.4 cm/s   Systemic VTI:  0.16 m MR Peak grad: 101.6 mmHg  Systemic Diam: 1.80 cm MR Mean grad: 65.0 mmHg MR Vmax:      504.00 cm/s MR Vmean:     378.0 cm/s Sunit Tolia Electronically signed by [employer] Signature Date/Time: 12/15/2023/8:02:30 PM    Final         Scheduled Meds:  allopurinol   50 mg Oral Daily   aspirin  EC  81 mg Oral Daily   carvedilol   6.25 mg Oral BID WC   Chlorhexidine  Gluconate Cloth  6 each Topical Daily   escitalopram  10 mg Oral Daily   furosemide   60 mg Intravenous BID   gabapentin   300 mg Oral TID   heparin  injection (subcutaneous)  5,000 Units Subcutaneous Q8H   insulin  aspart  0-9 Units Subcutaneous Q4H   insulin  glargine-yfgn  10 Units Subcutaneous Daily   latanoprost   1 drop Both Eyes QHS   morphine   15 mg Oral Q12H   pantoprazole   40 mg Oral Daily   sodium chloride  flush  3 mL Intravenous Q12H   Continuous Infusions:   ceFAZolin  (ANCEF ) IV 1 g (12/17/23 0654)   vancomycin  1,250 mg (12/16/23 0541)     LOS: 3 days       Seena Dadds, MD [facility]   To contact the attending provider between 7A-7P or the covering provider during after hours 7P-7A, please log into the web site [email or web address] and access using universal [place] password for that web site. If you do not have the password, please call the hospital operator.  12/17/2023, 2:34 PM

## 2023-12-17 NOTE — Plan of Care (Signed)
  Problem: Education: Goal: Knowledge of General Education information will improve Description: Including pain rating scale, medication(s)/side effects and non-pharmacologic comfort measures Outcome: Progressing   Problem: Health Behavior/Discharge Planning: Goal: Ability to manage health-related needs will improve Outcome: Progressing   Problem: Clinical Measurements: Goal: Ability to maintain clinical measurements within normal limits will improve Outcome: Progressing Goal: Respiratory complications will improve Outcome: Not Applicable Goal: Cardiovascular complication will be avoided Outcome: Not Applicable   Problem: Nutrition: Goal: Adequate nutrition will be maintained Outcome: Progressing   Problem: Coping: Goal: Level of anxiety will decrease Outcome: Progressing   Problem: Elimination: Goal: Will not experience complications related to bowel motility Outcome: Progressing Goal: Will not experience complications related to urinary retention Outcome: Progressing   Problem: Pain Managment: Goal: General experience of comfort will improve and/or be controlled Outcome: Progressing   Problem: Safety: Goal: Ability to remain free from injury will improve Outcome: Progressing   Problem: Skin Integrity: Goal: Risk for impaired skin integrity will decrease Outcome: Progressing   Problem: Education: Goal: Ability to describe self-care measures that may prevent or decrease complications (Diabetes Survival Skills Education) will improve Outcome: Progressing   Problem: Coping: Goal: Ability to adjust to condition or change in health will improve Outcome: Progressing   Problem: Fluid Volume: Goal: Ability to maintain a balanced intake and output will improve Outcome: Progressing   Problem: Health Behavior/Discharge Planning: Goal: Ability to identify and utilize available resources and services will improve Outcome: Progressing Goal: Ability to manage  health-related needs will improve Outcome: Progressing   Problem: Metabolic: Goal: Ability to maintain appropriate glucose levels will improve Outcome: Progressing   Problem: Nutritional: Goal: Maintenance of adequate nutrition will improve Outcome: Progressing Goal: Progress toward achieving an optimal weight will improve Outcome: Progressing   Problem: Skin Integrity: Goal: Risk for impaired skin integrity will decrease Outcome: Progressing   Problem: Tissue Perfusion: Goal: Adequacy of tissue perfusion will improve Outcome: Progressing   Problem: Fluid Volume: Goal: Hemodynamic stability will improve Outcome: Progressing

## 2023-12-17 NOTE — Progress Notes (Signed)
 Chaplain responded to [facility] consult for spiritual support. Chaplain introduced spiritual care and offered support in the setting of inpatient admission pending amputation. Chaplain asked open ended questions to facilitate story telling and emotional expression.  Nixon shared that she has been through many medical trials over the last several years and in many of them, she has been able to see how losing opportunities created availability to be more present for her family in times of trial. Presently she is having trouble finding the meaning in this journey. She has been waiting for an amputation for several days, but has been deferred due to cardiac issues. Today, the doctor frightened her as she understood him to say that she would die if she had the amputation. She realized that she would die if she did not. She shared this fear with her other physician and received clarification that she would die if she had the surgery now, so they have postponed it indefinitely while waiting for other labs to improve. She understandably finds this discouraging.  Chaplain utilized reflective listening to identify sources of strength and coping strategies in addition to challenges. Andretta expressed gratitude for the support of family and friends, particularly her grandchildren and her faith community. (Her sister joined us  toward the end of the visit.) She is finding a daily gratitude practice suggested by a psychiatrist to be a helpful way to end the day and is praying and crying a lot, per her report. Chaplain validated the need to release heavy feelings that build up and also assisted with reframing Christian perspectives on trust and faith.   Quinita consents to further support from spiritual care and chaplain provided information on how to page the chaplain for a faster response vs [place] consult.   Please page as further needs arise.  Lavona Pounds. Davee Lomax, M.Div. [facility] Chaplain Pager [phone] Office [phone]

## 2023-12-17 NOTE — Progress Notes (Addendum)
 [facility]    Date of Admission:  12/14/2023      ID: Dana [name] is a 63 y.o. female with  MRSA bacteremia with left ankle osteomyelitis Principal Problem:   Surgical site infection Active Problems:   Chronic combined systolic (congestive) and diastolic (congestive) heart failure (HCC)   Chronic respiratory failure with hypoxia (HCC)   OSA on CPAP   Uncontrolled type 1 diabetes mellitus with hyperglycemia, with long-term current use of insulin  (HCC)   Generalized anxiety disorder   CKD stage 4 due to type 1 diabetes mellitus (HCC)   Coronary artery disease involving native coronary artery of native heart with angina pectoris (HCC)   MRSA bacteremia   Depression with anxiety   UTI (urinary tract infection)   Severe protein-calorie malnutrition (HCC)   Charcot's joint of foot in type 2 diabetes mellitus (HCC)   High pulmonary arterial pressure (HCC)    Subjective: Afebrile, underwent 3L diuresis yesterday. Starting to feel less edema to legs.still has pain to left ankle  Medications:   allopurinol   50 mg Oral Daily   aspirin  EC  81 mg Oral Daily   carvedilol   6.25 mg Oral BID WC   Chlorhexidine  Gluconate Cloth  6 each Topical Daily   escitalopram  10 mg Oral Daily   furosemide   60 mg Intravenous BID   gabapentin   300 mg Oral TID   heparin  injection (subcutaneous)  5,000 Units Subcutaneous Q8H   insulin  aspart  0-9 Units Subcutaneous Q4H   insulin  glargine-yfgn  10 Units Subcutaneous Daily   latanoprost   1 drop Both Eyes QHS   morphine   15 mg Oral Q12H   pantoprazole   40 mg Oral Daily   sodium chloride  flush  3 mL Intravenous Q12H    Objective: Vital signs in last 24 hours: Temp:  [97.5 F (36.4 C)-98.3 F (36.8 C)] 97.5 F (36.4 C) (06/03 1951) Pulse Rate:  [69] 69 (06/03 0437) Resp:  [12-15] 15 (06/03 0437) BP: (117-142)/(45-62) 129/59 (06/03 1951) SpO2:  [100 %] 100 % (06/03 0437) FiO2 (%):  [21 %] 21 % (06/03 0023) Physical  Exam  Constitutional:  oriented to person, place, and time. appears well-developed and well-nourished. No distress.  HENT: [place]/AT, PERRLA, no scleral icterus Mouth/Throat: Oropharynx is clear and moist. No oropharyngeal exudate.  Cardiovascular: Normal rate, regular rhythm and normal heart sounds. Exam reveals no gallop and no friction rub.  No murmur heard.  Pulmonary/Chest: Effort normal and breath sounds normal. No respiratory distress.  has no wheezes.  Neck = supple, no nuchal rigidity Abdominal: Soft. Bowel sounds are normal.  exhibits no distension. There is no tenderness.  Ext: +2 edema. Left ankle erythema/wrapped Skin: Skin is warm and dry. No rash noted. No erythema.  Psychiatric: a normal mood and affect.  behavior is normal.    Lab Results Recent Labs    12/16/23 0315 12/17/23 0409  WBC 11.0* 14.9*  HGB 9.2* 9.4*  HCT 28.9* 30.0*  NA 133* 135  K 4.3 4.3  CL 102 102  CO2 21* 24  BUN 55* 52*  CREATININE 2.16* 2.06*      Microbiology: reviewed Studies/Results: NM Pulmonary Perfusion Result Date: 12/16/2023 CLINICAL DATA:  right heart failure, recent surgery, EXAM: NUCLEAR MEDICINE PERFUSION LUNG SCAN TECHNIQUE: Perfusion images were obtained in multiple projections after intravenous injection of radiopharmaceutical. No ventilation imaging performed. RADIOPHARMACEUTICALS:  4.0 mCi Tc-62m MAA IV COMPARISON:  Chest radiographs 12/16/2023 and 12/14/2023. Nuclear medicine perfusion scans  08/27/2023 and 11/01/2016. FINDINGS: Mildly heterogeneous left perihilar perfusion, similar to previous lung scan. No new or wedge-shaped perfusion defects are demonstrated to suggest acute pulmonary embolism. The right lung perfusion appears normal. IMPRESSION: No evidence of acute pulmonary embolism on perfusion scintigraphy by PISAPED criteria. Mildly heterogeneous left perihilar perfusion, similar to previous examination. Electronically Signed   By: Elmon Hagedorn M.D.   On: 12/16/2023  13:06   DG Chest 1 View Result Date: 12/16/2023 CLINICAL DATA:  For comparison with the 2 scan EXAM: CHEST  1 VIEW COMPARISON:  12/14/2023 FINDINGS: Cardiomegaly. No confluent airspace opacities, effusions or overt edema. No acute bony abnormality. IMPRESSION: Stable cardiomegaly.  No active disease. Electronically Signed   By: Janeece Mechanic M.D.   On: 12/16/2023 12:19     Assessment/Plan: MRSA bacteremia with associated left ankle osteomyelitis = she is slated for left bka which will help with source control  Increased leukocytosis = possibly due to not yet having source control. Anticipate to trend down after surgery  Pulmonary edema/elevated right sided pressures of echo = continue with diuresis  Continue on contact isolate for mrsa  evaluation of this patient requires complex antimicrobial therapy evaluation and counseling and isolation needs for disease transmission risk assessment and mitigation.    [facility] Pager: [phone]  12/17/2023, 8:16 PM

## 2023-12-17 NOTE — Telephone Encounter (Signed)
 Patient's  coreg  medication has been sent to pt's pharmacy.

## 2023-12-17 NOTE — Progress Notes (Signed)
 Progress Note  Patient Name: Dana [name] Date of Encounter: 12/17/2023 [facility] Cardiologist: Jules Oar, MD   Interval Summary   She diuresed well overnight almost 3 L.  Does not report any shortness of breath or improvement as she has not had any symptoms.  Seems to be very overwhelmed with current situation and frustrated with the delay in surgery.  Vital Signs Vitals:   12/16/23 2041 12/16/23 2335 12/17/23 0437 12/17/23 0821  BP: 133/62  (!) 142/58 (!) 117/45  Pulse:   69   Resp: 12  15   Temp: (!) 97.5 F (36.4 C) 98.1 F (36.7 C) 97.7 F (36.5 C) 98.3 F (36.8 C)  TempSrc: Oral Oral Oral Oral  SpO2:   100%   Weight:      Height:        Intake/Output Summary (Last 24 hours) at 12/17/2023 0855 Last data filed at 12/17/2023 0437 Gross per 24 hour  Intake 340 ml  Output 2875 ml  Net -2535 ml      12/14/2023    2:00 AM 12/14/2023    1:00 AM 11/12/2023    4:16 AM  Last 3 Weights  Weight (lbs) 216 lb 0.8 oz 227 lb 11.8 oz 214 lb 0.4 oz  Weight (kg) 98 kg 103.3 kg 97.08 kg      Telemetry/ECG  Sinus rhythm heart rates in the 70s to 80s- Personally Reviewed  Physical Exam  GEN: No acute distress.   Neck: Positive JVD Cardiac: RRR, no murmurs, rubs, or gallops.  Respiratory: Diffuse crackles GI: Soft, nontender, non-distended  MS: No edema  Patient Profile Patient with past medical history significant for CAD status post multiple PCI's (RCA, LCx, LAD), last intervention DES to LAD from ISR 06/2024, heart failure with mildly reduced EF with CardioMEMS 2019, mTEER 2022, PAF status post ablation 2017 no longer on DOAC, PEA arrest 12/2020 in the setting of DKA felt related to respiratory arrest, PAD status post left great toe and fourth toe amputation, CKD stage IV, uncontrolled diabetes type 1.  Patient currently admitted for sepsis secondary to bilateral pyelonephritis, left ankle postop infection with failed internal fixation with cellulitis and  ulceration.  Now has plans for left BKA.  Cardiology asked to see him for preoperative evaluation.  Assessment & Plan   Preoperative evaluation for left BKA Sepsis with MRSA bacteremia, left ankle infection Bilateral pyelonephritis Currently patient here with sepsis secondary to left ankle fracture/failed internal fixation with cellulitis and ulceration, bilateral pyelonephritis, MRSA bacteremia.  She has extensive CAD with multiple stents/NSTEMI's/in-stent restenosis in LAD and multiple other cardiovascular history detailed below.  However in summary seems to have stable disease with no significant complaints.  Cannot assess METS given fracture. Her RCRI is 11% indicating she is high risk for any cardiovascular complications.  Also has significantly elevated RVSP which puts her at greater risk for anesthesia.    Currently we are trying to diurese her and lower RVSP pressure likely related to volume overload.  See below discussion, still appears to be volume up today.  But improving.   Currently Brilinta  being held perioperatively. Resume per surgery.  Negative PE on VQ scan No vegetation noted on echocardiogram.  Tentative plans for TEE once we can optimize her. Infectious disease is following as well   CAD with multiple PCI's Multiple ISR of LAD Last intervention was in January 2025 showing restented stenosis of previous 2 layered stent.  CTO of mid LCx, patent OM with small distal disease  not amenable to PCI.  RCA with patent stent.  She had successful scoring balloon angioplasty of the pLAD only.  Report suggest not likely a long lasting result. Fortunately with no anginal complaints. Continue with aspirin .  Resume Brilinta  per surgery.  Continue carvedilol  6.25 mg twice daily On Repatha  at home.  LDL 28 5 months ago, excellent with LDL goal less than 55.   Chronic heart failure with mildly reduced EF Echo this admission with EF 40 to 45%, mild LVH, interventricular septum flattening.  Low  normal RV function with enlarged RV and severely elevated RVSP 65.7.  Volume status is a little bit challenging to assess.   Elevated RVSP consistent with hypervolemia.  Started on IV Lasix  yesterday and she has had good diuresis almost 3 L, but still has more room to go with crackles still on exam.   She was on IV Lasix  40 mg, this was increased to 60 mg twice daily per primary team. Continue. After she achieves euvolemia may consider repeating echocardiogram or performing right heart cath to definitively evaluate volume status. No SGLT2 inhibitor with pyelonephritis Continue carvedilol  6.25 mg twice daily.  GDMT limited by renal disease.   Status post mTEER 2022 Failed clipping.  But has had stable mitral regurgitation.  Echocardiogram this admission mild to moderate MR.  Mean gradient 5.  Moderate TR.  Moderate PR.  Continue to follow outpatient   PAF status post ablation 2017 Maintaining NSR.  No documented recurrences and has not been on DOAC.   CKD stage IV High risk for disease progression.  Renal function improving slightly with diuresis, perhaps renal congestion.  Continue to monitor closely   Uncontrolled insulin  dependent diabetes A1c 10.2% 1 month ago on insulin  per primary team.  Adjust this accordingly perioperatively.   OSA on CPAP Compliant  Anemia Hemoglobin is 9.4.  Seems to be stable.  Will need to continue to follow this closely reinstating Brilinta  when appropriate postoperatively.     For questions or updates, please contact [facility] Please consult [email or web address] for contact info under       Signed, Burnetta Cart, PA-C

## 2023-12-18 ENCOUNTER — Inpatient Hospital Stay (HOSPITAL_COMMUNITY): Payer: Self-pay | Admitting: Certified Registered Nurse Anesthetist

## 2023-12-18 ENCOUNTER — Encounter (HOSPITAL_COMMUNITY): Payer: Self-pay | Admitting: Family Medicine

## 2023-12-18 ENCOUNTER — Other Ambulatory Visit: Payer: Self-pay

## 2023-12-18 ENCOUNTER — Encounter (HOSPITAL_COMMUNITY)
Admission: EM | Disposition: A | Discharge status: Rehabilitation Facility | Payer: Self-pay | Source: Other Acute Inpatient Hospital | Attending: Internal Medicine

## 2023-12-18 DIAGNOSIS — I2721 Secondary pulmonary arterial hypertension: Secondary | ICD-10-CM | POA: Diagnosis not present

## 2023-12-18 DIAGNOSIS — E11610 Type 2 diabetes mellitus with diabetic neuropathic arthropathy: Secondary | ICD-10-CM | POA: Diagnosis not present

## 2023-12-18 DIAGNOSIS — R7881 Bacteremia: Secondary | ICD-10-CM | POA: Diagnosis not present

## 2023-12-18 DIAGNOSIS — I2510 Atherosclerotic heart disease of native coronary artery without angina pectoris: Secondary | ICD-10-CM

## 2023-12-18 DIAGNOSIS — J449 Chronic obstructive pulmonary disease, unspecified: Secondary | ICD-10-CM

## 2023-12-18 DIAGNOSIS — T8149XA Infection following a procedure, other surgical site, initial encounter: Secondary | ICD-10-CM | POA: Diagnosis not present

## 2023-12-18 DIAGNOSIS — I4891 Unspecified atrial fibrillation: Secondary | ICD-10-CM

## 2023-12-18 DIAGNOSIS — T8744 Infection of amputation stump, left lower extremity: Secondary | ICD-10-CM

## 2023-12-18 DIAGNOSIS — E1022 Type 1 diabetes mellitus with diabetic chronic kidney disease: Secondary | ICD-10-CM | POA: Diagnosis not present

## 2023-12-18 HISTORY — PX: AMPUTATION: SHX166

## 2023-12-18 LAB — CBC
HCT: 30.4 % — ABNORMAL LOW (ref 36.0–46.0)
Hemoglobin: 9.5 g/dL — ABNORMAL LOW (ref 12.0–15.0)
MCH: 27.4 pg (ref 26.0–34.0)
MCHC: 31.3 g/dL (ref 30.0–36.0)
MCV: 87.6 fL (ref 80.0–100.0)
Platelets: 280 10*3/uL (ref 150–400)
RBC: 3.47 MIL/uL — ABNORMAL LOW (ref 3.87–5.11)
RDW: 16.7 % — ABNORMAL HIGH (ref 11.5–15.5)
WBC: 13.4 10*3/uL — ABNORMAL HIGH (ref 4.0–10.5)
nRBC: 0 % (ref 0.0–0.2)

## 2023-12-18 LAB — BASIC METABOLIC PANEL WITH GFR
Anion gap: 11 (ref 5–15)
BUN: 57 mg/dL — ABNORMAL HIGH (ref 6–20)
CO2: 25 mmol/L (ref 22–32)
Calcium: 8.8 mg/dL — ABNORMAL LOW (ref 8.9–10.3)
Chloride: 99 mmol/L (ref 98–111)
Creatinine, Ser: 2.05 mg/dL — ABNORMAL HIGH (ref 0.44–1.00)
GFR, Estimated: 27 mL/min — ABNORMAL LOW (ref >60)
Glucose, Bld: 80 mg/dL (ref 70–99)
Potassium: 4.5 mmol/L (ref 3.5–5.1)
Sodium: 135 mmol/L (ref 135–145)

## 2023-12-18 LAB — GLUCOSE, CAPILLARY
Glucose-Capillary: 106 mg/dL — ABNORMAL HIGH (ref 70–99)
Glucose-Capillary: 109 mg/dL — ABNORMAL HIGH (ref 70–99)
Glucose-Capillary: 98 mg/dL (ref 70–99)
Glucose-Capillary: 140 mg/dL — ABNORMAL HIGH (ref 70–99)
Glucose-Capillary: 232 mg/dL — ABNORMAL HIGH (ref 70–99)
Glucose-Capillary: 261 mg/dL — ABNORMAL HIGH (ref 70–99)

## 2023-12-18 LAB — SURGICAL PCR SCREEN
MRSA, PCR: NEGATIVE
Staphylococcus aureus: NEGATIVE

## 2023-12-18 LAB — VANCOMYCIN, RANDOM: Vancomycin Rm: 21 ug/mL

## 2023-12-18 SURGERY — AMPUTATION BELOW KNEE
Anesthesia: General | Site: Knee | Laterality: Left

## 2023-12-18 MED ORDER — 0.9 % SODIUM CHLORIDE (POUR BTL) OPTIME
TOPICAL | Status: DC | PRN
  Administered 2023-12-18: 1000 mL

## 2023-12-18 MED ORDER — FENTANYL CITRATE (PF) 250 MCG/5ML IJ SOLN
INTRAMUSCULAR | Status: AC
  Filled 2023-12-18: qty 5

## 2023-12-18 MED ORDER — PHENYLEPHRINE HCL-NACL 20-0.9 MG/250ML-% IV SOLN
INTRAVENOUS | Status: DC | PRN
  Administered 2023-12-18: 50 ug/min via INTRAVENOUS

## 2023-12-18 MED ORDER — CHLORHEXIDINE GLUCONATE 0.12 % MT SOLN: 15.0000 mL | Freq: Once | OROMUCOSAL | Status: AC

## 2023-12-18 MED ORDER — SENNA 8.6 MG PO TABS
2.0000 | ORAL_TABLET | Freq: Every day | ORAL | Status: DC
  Administered 2023-12-18 – 2023-12-26 (×4): 17.2 mg via ORAL
  Filled 2023-12-18 (×8): qty 2

## 2023-12-18 MED ORDER — LIDOCAINE 2% (20 MG/ML) 5 ML SYRINGE
INTRAMUSCULAR | Status: AC
  Filled 2023-12-18: qty 5

## 2023-12-18 MED ORDER — FENTANYL CITRATE (PF) 100 MCG/2ML IJ SOLN
INTRAMUSCULAR | Status: AC
  Administered 2023-12-18: 25 ug via INTRAVENOUS
  Filled 2023-12-18: qty 2

## 2023-12-18 MED ORDER — VASHE WOUND IRRIGATION OPTIME
TOPICAL | Status: DC | PRN
  Administered 2023-12-18: 34 [oz_av]

## 2023-12-18 MED ORDER — POLYETHYLENE GLYCOL 3350 17 G PO PACK
17.0000 g | PACK | Freq: Every day | ORAL | Status: DC
  Filled 2023-12-18 (×6): qty 1

## 2023-12-18 MED ORDER — PHENYLEPHRINE 80 MCG/ML (10ML) SYRINGE FOR IV PUSH (FOR BLOOD PRESSURE SUPPORT)
PREFILLED_SYRINGE | INTRAVENOUS | Status: DC | PRN
  Administered 2023-12-18 (×2): 80 ug via INTRAVENOUS

## 2023-12-18 MED ORDER — FENTANYL CITRATE (PF) 100 MCG/2ML IJ SOLN: 25.0000 ug | Freq: Once | INTRAMUSCULAR | Status: AC

## 2023-12-18 MED ORDER — PROPOFOL 10 MG/ML IV BOLUS
INTRAVENOUS | Status: DC | PRN
  Administered 2023-12-18: 120 mg via INTRAVENOUS

## 2023-12-18 MED ORDER — BUPIVACAINE HCL (PF) 0.5 % IJ SOLN
INTRAMUSCULAR | Status: DC | PRN
  Administered 2023-12-18: 15 mL via PERINEURAL

## 2023-12-18 MED ORDER — ONDANSETRON HCL 4 MG/2ML IJ SOLN
INTRAMUSCULAR | Status: AC
Start: 2023-12-18 — End: ?
  Filled 2023-12-18: qty 2

## 2023-12-18 MED ORDER — BUPIVACAINE LIPOSOME 1.3 % IJ SUSP
INTRAMUSCULAR | Status: DC | PRN
  Administered 2023-12-18: 10 mL via PERINEURAL

## 2023-12-18 MED ORDER — PROPOFOL 10 MG/ML IV BOLUS
INTRAVENOUS | Status: AC
  Filled 2023-12-18: qty 20

## 2023-12-18 MED ORDER — ROPIVACAINE HCL 5 MG/ML IJ SOLN
INTRAMUSCULAR | Status: DC | PRN
Start: 2023-12-18 — End: 2023-12-18
  Administered 2023-12-18: 15 mL via PERINEURAL

## 2023-12-18 MED ORDER — ONDANSETRON HCL 4 MG/2ML IJ SOLN
INTRAMUSCULAR | Status: AC
  Filled 2023-12-18: qty 2

## 2023-12-18 MED ORDER — SCOPOLAMINE 1 MG/3DAYS TD PT72
1.0000 | MEDICATED_PATCH | TRANSDERMAL | Status: DC
  Administered 2023-12-18: 1.5 mg via TRANSDERMAL
  Filled 2023-12-18: qty 1

## 2023-12-18 MED ORDER — ORAL CARE MOUTH RINSE: 15.0000 mL | Freq: Once | OROMUCOSAL | Status: AC

## 2023-12-18 MED ORDER — MIDAZOLAM HCL 2 MG/2ML IJ SOLN
INTRAMUSCULAR | Status: AC
Start: 2023-12-18 — End: ?
  Filled 2023-12-18: qty 2

## 2023-12-18 MED ORDER — ONDANSETRON HCL 4 MG/2ML IJ SOLN
INTRAMUSCULAR | Status: DC | PRN
  Administered 2023-12-18: 4 mg via INTRAVENOUS

## 2023-12-18 MED ORDER — MIDAZOLAM HCL 2 MG/2ML IJ SOLN: 1.0000 mg | Freq: Once | INTRAMUSCULAR | Status: AC

## 2023-12-18 MED ORDER — CHLORHEXIDINE GLUCONATE 0.12 % MT SOLN
OROMUCOSAL | Status: AC
Start: 2023-12-18 — End: 2023-12-18
  Administered 2023-12-18: 15 mL via OROMUCOSAL
  Filled 2023-12-18: qty 15

## 2023-12-18 MED ORDER — DEXAMETHASONE SODIUM PHOSPHATE 10 MG/ML IJ SOLN
INTRAMUSCULAR | Status: DC | PRN
  Administered 2023-12-18: 5 mg via INTRAVENOUS

## 2023-12-18 MED ORDER — SODIUM CHLORIDE 0.9 % IV SOLN: INTRAVENOUS | Status: DC

## 2023-12-18 MED ORDER — LIDOCAINE 2% (20 MG/ML) 5 ML SYRINGE
INTRAMUSCULAR | Status: DC | PRN
  Administered 2023-12-18: 40 mg via INTRAVENOUS

## 2023-12-18 MED ORDER — MIDAZOLAM HCL 2 MG/2ML IJ SOLN
INTRAMUSCULAR | Status: AC
  Administered 2023-12-18: 1 mg via INTRAVENOUS
  Filled 2023-12-18: qty 2

## 2023-12-18 SURGICAL SUPPLY — 33 items
BAG COUNTER SPONGE SURGICOUNT (BAG) IMPLANT
BLADE SAW RECIP 87.9 MT (BLADE) ×1 IMPLANT
BLADE SURG 21 STRL SS (BLADE) ×1 IMPLANT
BNDG COHESIVE 6X5 TAN ST LF (GAUZE/BANDAGES/DRESSINGS) IMPLANT
CANISTER WOUND CARE 500ML ATS (WOUND CARE) ×1 IMPLANT
COVER SURGICAL LIGHT HANDLE (MISCELLANEOUS) ×1 IMPLANT
CUFF TRNQT CYL 34X4.125X (TOURNIQUET CUFF) ×1 IMPLANT
DRAPE INCISE IOBAN 66X45 STRL (DRAPES) ×1 IMPLANT
DRAPE U-SHAPE 47X51 STRL (DRAPES) ×1 IMPLANT
DRESSING PREVENA PLUS CUSTOM (GAUZE/BANDAGES/DRESSINGS) ×1 IMPLANT
DRSG VAC PEEL AND PLACE LRG (GAUZE/BANDAGES/DRESSINGS) IMPLANT
DURAPREP 26ML APPLICATOR (WOUND CARE) ×1 IMPLANT
ELECTRODE REM PT RTRN 9FT ADLT (ELECTROSURGICAL) ×1 IMPLANT
GLOVE BIOGEL PI IND STRL 9 (GLOVE) ×1 IMPLANT
GLOVE SURG ORTHO 9.0 STRL STRW (GLOVE) ×1 IMPLANT
GOWN STRL REUS W/ TWL XL LVL3 (GOWN DISPOSABLE) ×2 IMPLANT
GRAFT SKIN WND MICRO 38 (Tissue) IMPLANT
KIT BASIN OR (CUSTOM PROCEDURE TRAY) ×1 IMPLANT
KIT TURNOVER KIT B (KITS) ×1 IMPLANT
MANIFOLD NEPTUNE II (INSTRUMENTS) ×1 IMPLANT
NS IRRIG 1000ML POUR BTL (IV SOLUTION) ×1 IMPLANT
PACK ORTHO EXTREMITY (CUSTOM PROCEDURE TRAY) ×1 IMPLANT
PAD ARMBOARD POSITIONER FOAM (MISCELLANEOUS) ×1 IMPLANT
PREVENA RESTOR ARTHOFORM 46X30 (CANNISTER) ×1 IMPLANT
SPONGE T-LAP 18X18 ~~LOC~~+RFID (SPONGE) IMPLANT
STAPLER SKIN PROX 35W (STAPLE) IMPLANT
STOCKINETTE IMPERVIOUS LG (DRAPES) ×1 IMPLANT
SUT ETHILON 2 0 PSLX (SUTURE) IMPLANT
SUT SILK 2-0 18XBRD TIE 12 (SUTURE) ×1 IMPLANT
SUT VIC AB 1 CTX 27 (SUTURE) ×2 IMPLANT
TOWEL GREEN STERILE (TOWEL DISPOSABLE) ×1 IMPLANT
TUBE CONNECTING 12X1/4 (SUCTIONS) ×1 IMPLANT
YANKAUER SUCT BULB TIP NO VENT (SUCTIONS) ×1 IMPLANT

## 2023-12-18 NOTE — Anesthesia Procedure Notes (Signed)
 Anesthesia Regional Block: Popliteal block   Pre-Anesthetic Checklist: , timeout performed,  Correct Patient, Correct Site, Correct Laterality,  Correct Procedure, Correct Position, site marked,  Risks and benefits discussed,  Surgical consent,  Pre-op  evaluation,  At surgeon's request and post-op pain management  Laterality: Left  Prep: chloraprep       Needles:  Injection technique: Single-shot  Needle Type: Echogenic Stimulator Needle     Needle Length: 9cm  Needle Gauge: 21     Additional Needles:   Procedures:,,,, ultrasound used (permanent image in chart),,    Narrative:  Start time: 12/18/2023 9:58 AM End time: 12/18/2023 10:02 AM Injection made incrementally with aspirations every 5 mL.  Performed by: Personally  Anesthesiologist: Willian Harrow, MD  Additional Notes: Discussed risks and benefits of the nerve block in detail, including but not limited vascular injury, permanent nerve damage and infection.   Patient tolerated the procedure well. Local anesthetic introduced in an incremental fashion under minimal resistance after negative aspirations. No paresthesias were elicited. After completion of the procedure, no acute issues were identified and patient continued to be monitored by RN.

## 2023-12-18 NOTE — Progress Notes (Signed)
 Inpatient Rehab Admissions Coordinator:   Consult received and chart reviewed.  Will follow up after post-op therapy assessments completed.   Loye Rumble, PT, DPT Admissions Coordinator [phone] 12/18/23  12:22 PM

## 2023-12-18 NOTE — Interval H&P Note (Signed)
 History and Physical Interval Note:  12/18/2023 6:48 AM  Dana [name]  has presented today for surgery, with the diagnosis of Left ankle infection.  The various methods of treatment have been discussed with the patient and family. After consideration of risks, benefits and other options for treatment, the patient has consented to  Procedure(s): AMPUTATION BELOW KNEE (Left) as a surgical intervention.  The patient's history has been reviewed, patient examined, no change in status, stable for surgery.  I have reviewed the patient's chart and labs.  Questions were answered to the patient's satisfaction.     [name]

## 2023-12-18 NOTE — Progress Notes (Signed)
 Orthopedic Tech Progress Note Patient Details:  Dana [name] 05/26/1961 [id number]  Patient has VIVE PROTOCOL    Patient ID: Dana [name], female   DOB: May 16, 1961, 63 y.o.   MRN: [id number]  [name] 12/18/2023, 11:14 AM

## 2023-12-18 NOTE — Anesthesia Preprocedure Evaluation (Addendum)
 Anesthesia Evaluation  Patient identified by MRN, date of birth, ID band Patient awake    Reviewed: Allergy  & Precautions, NPO status , Patient's Chart, lab work & pertinent test results  Airway Mallampati: II  TM Distance: >3 FB Neck ROM: Full    Dental  (+) Teeth Intact, Dental Advisory Given   Pulmonary sleep apnea , COPD, former smoker   breath sounds clear to auscultation       Cardiovascular hypertension, + CAD, + Past MI, + Cardiac Stents and +CHF  + dysrhythmias Atrial Fibrillation  Rhythm:Regular Rate:Normal  Echo:   1. Left ventricular ejection fraction, by estimation, is 40 to 45%. The  left ventricle has mildly decreased function. The left ventricle  demonstrates global hypokinesis. There is mild left ventricular  hypertrophy. Left ventricular diastolic function  could not be evaluated. There is the interventricular septum is flattened  in systole and diastole, consistent with right ventricular pressure and  volume overload.   2. Right ventricular systolic function is low normal. The right  ventricular size is enlarged. There is severely elevated pulmonary artery  systolic pressure. The estimated right ventricular systolic pressure is  65.7 mmHg.   3. Left atrial size was mildly dilated.   4. S/P mitral clip, mild to moderate MR, no valvular stenosis (MG 5 mmHg,  HR 72bpm). . The mean mitral valve gradient is 5.0 mmHg with average heart  rate of 72 bpm.   5. Tricuspid valve regurgitation is moderate.   6. The aortic valve is tricuspid. Aortic valve regurgitation is not  visualized. Aortic valve sclerosis is present, with no evidence of aortic  valve stenosis.   7. Pulmonic valve regurgitation is moderate.   8. The inferior vena cava is dilated in size with <50% respiratory  variability, suggesting right atrial pressure of 15 mmHg.     Neuro/Psych  PSYCHIATRIC DISORDERS Anxiety Depression     Neuromuscular  disease CVA    GI/Hepatic Neg liver ROS,GERD  ,,  Endo/Other  diabetesHypothyroidism    Renal/GU Renal disease     Musculoskeletal   Abdominal   Peds  Hematology   Anesthesia Other Findings   Reproductive/Obstetrics                             Anesthesia Physical Anesthesia Plan  ASA: 3  Anesthesia Plan: General   Post-op Pain Management: Regional block*   Induction: Intravenous  PONV Risk Score and Plan: 4 or greater and Ondansetron , Dexamethasone , Midazolam  and Scopolamine patch - Pre-op   Airway Management Planned: LMA  Additional Equipment: None  Intra-op Plan:   Post-operative Plan: Extubation in OR  Informed Consent: I have reviewed the patients History and Physical, chart, labs and discussed the procedure including the risks, benefits and alternatives for the proposed anesthesia with the patient or authorized representative who has indicated his/her understanding and acceptance.     Dental advisory given  Plan Discussed with: CRNA  Anesthesia Plan Comments: (Lab Results      Component                Value               Date                      WBC                      13.4 (H)  12/18/2023                HGB                      9.5 (L)             12/18/2023                HCT                      30.4 (L)            12/18/2023                MCV                      87.6                12/18/2023                PLT                      280                 12/18/2023           )       Anesthesia Quick Evaluation

## 2023-12-18 NOTE — TOC Progression Note (Signed)
 Transition of Care [facility]) - Progression Note    Patient Details  Name: Dana [name] MRN: [id number] Date of Birth: 09-27-1960  [facility] CM/SW Contact  Jannice Mends, LCSW Phone Number: 12/18/2023, 9:01 AM  Clinical Narrative:    CSW continuing to follow for disposition needs. Patient in surgery today.    Expected Discharge Plan: IP Rehab Facility Barriers to Discharge: English as a second language teacher, Continued Medical Work up  Expected Discharge Plan and Services       Living arrangements for the past 2 months: Single Family Home                                       Social Determinants of Health (SDOH) Interventions SDOH Screenings   Food Insecurity: No Food Insecurity (12/14/2023)  Housing: Low Risk  (12/14/2023)  Transportation Needs: No Transportation Needs (12/14/2023)  Utilities: Not At Risk (12/14/2023)  Alcohol Screen: Low Risk  (10/24/2023)  Depression (PHQ2-9): High Risk (10/24/2023)  Financial Resource Strain: Low Risk  (10/24/2023)  Physical Activity: Insufficiently Active (10/24/2023)  Social Connections: Moderately Integrated (12/14/2023)  Stress: Stress Concern Present (10/24/2023)  Tobacco Use: Medium Risk (12/18/2023)  Health Literacy: Adequate Health Literacy (10/24/2023)    Readmission Risk Interventions    07/19/2023    3:45 PM 12/16/2022   11:54 AM 05/19/2021    1:11 PM  Readmission Risk Prevention Plan  Transportation Screening Complete Complete Complete  Medication Review (RN Care Manager) Referral to Pharmacy Referral to Pharmacy Complete  PCP or Specialist appointment within 3-5 days of discharge  Complete Complete  HRI or Home Care Consult Complete Complete Complete  SW Recovery Care/Counseling Consult Complete Complete Complete  Palliative Care Screening Not Applicable Not Applicable Not Applicable  Skilled Nursing Facility Not Applicable Not Applicable Not Applicable

## 2023-12-18 NOTE — Op Note (Signed)
 12/18/2023  11:23 AM  PATIENT:  Dana [name]    PRE-OPERATIVE DIAGNOSIS:  Left ankle infection  POST-OPERATIVE DIAGNOSIS:  Same  PROCEDURE:  AMPUTATION BELOW KNEE Application of Kerecis micro graft 38 cm. Application of Prevena Peel and Place wound VAC dressings Application of Vive Wear stump shrinker and the [employer] limb protector  SURGEON:  Timothy Ford, MD  ANESTHESIA:   General  PREOPERATIVE INDICATIONS:  [name] is a  63 y.o. female with a diagnosis of Left ankle infection who failed conservative measures and elected for surgical management.    The risks benefits and alternatives were discussed with the patient preoperatively including but not limited to the risks of infection, bleeding, nerve injury, cardiopulmonary complications, the need for revision surgery, among others, and the patient was willing to proceed.  OPERATIVE IMPLANTS:   Implant Name Type Inv. Item Serial No. Manufacturer Lot No. LRB No. Used Action  GRAFT SKIN WND MICRO 38 - [id number] Tissue GRAFT SKIN WND MICRO 38  [employer] [id number] Left 1 Implanted     OPERATIVE FINDINGS: muscle was grey with poor color poor consistency, poor contractility  OPERATIVE PROCEDURE: Patient was brought to the operating room after undergoing a regional anesthetic.  After adequate levels anesthesia were obtained a thigh tourniquet was placed and the lower extremity was prepped using DuraPrep draped into a sterile field. The foot was draped out of the sterile field with impervious stockinette.  A timeout was called and the tourniquet inflated.  A transverse skin incision was made 12 cm distal to the tibial tubercle, the incision curved proximally, and a large posterior flap was created.  The tibia was transected just proximal to the skin incision and beveled anteriorly.  The fibula was transected just proximal to the tibial incision.  The sciatic nerve was pulled cut and allowed to retract.  The vascular bundles were  suture ligated with 2-0 silk.  The tourniquet was deflated and hemostasis obtained.  The wound was irrigated with Vashe.   The Kerecis micro powder 38 cm was applied to the open wound that has a 200 cm surface area.    The deep and superficial fascial layers were closed using #1 Vicryl.  The skin was closed using staples.    The large Peel and Place   circumferential compression was secured to the skin with Dermatac.  This was connected to the wound VAC pump and had a good suction fit this was covered with a stump shrinker and a limb protector.  Patient was taken to the PACU in stable condition.   DISCHARGE PLANNING:  Antibiotic duration: 24-hour antibiotics  Weightbearing: Nonweightbearing on the operative extremity  Pain medication: Opioid pathway  Dressing care/ Wound VAC: Continue wound VAC with the Prevena plus pump at discharge for 1 week  Ambulatory devices: Walker or kneeling scooter  Discharge to: Discharge planning based on recommendations per physical therapy  Follow-up: In the office 1 week after discharge.

## 2023-12-18 NOTE — Anesthesia Procedure Notes (Signed)
 Anesthesia Regional Block: Adductor canal block   Pre-Anesthetic Checklist: , timeout performed,  Correct Patient, Correct Site, Correct Laterality,  Correct Procedure, Correct Position, site marked,  Risks and benefits discussed,  Surgical consent,  Pre-op  evaluation,  At surgeon's request and post-op pain management  Laterality: Left  Prep: chloraprep       Needles:  Injection technique: Single-shot  Needle Type: Echogenic Stimulator Needle     Needle Length: 9cm  Needle Gauge: 21     Additional Needles:   Procedures:,,,, ultrasound used (permanent image in chart),,    Narrative:  Start time: 12/18/2023 10:03 AM End time: 12/18/2023 10:08 AM Injection made incrementally with aspirations every 5 mL.  Performed by: Personally  Anesthesiologist: Willian Harrow, MD  Additional Notes: Discussed risks and benefits of the nerve block in detail, including but not limited vascular injury, permanent nerve damage and infection.   Patient tolerated the procedure well. Local anesthetic introduced in an incremental fashion under minimal resistance after negative aspirations. No paresthesias were elicited. After completion of the procedure, no acute issues were identified and patient continued to be monitored by RN.

## 2023-12-18 NOTE — Progress Notes (Signed)
 PT Cancellation Note  Patient Details Name: Dana [name] MRN: [id number] DOB: [birth year]   Cancelled Treatment:    Reason Eval/Treat Not Completed: Patient at procedure or test/unavailable (Off unit to OR for Lt BKA. Will follow up at later date/time as pt able and schedule allows.)    Corbin Dess PT, DPT Acute Rehabilitation Services Office [phone]  12/18/23 9:19 AM

## 2023-12-18 NOTE — Consult Note (Cosign Needed Addendum)
 [facility] Psychiatric Consult Initial  Patient Name: .Dana [name]  MRN: [id number]  DOB: 12-23-1960  Consult Order details:  Orders (From admission, onward)     Start     Ordered   12/15/23 1144  IP CONSULT TO PSYCHIATRY       Comments: This is nonurgent consult, patient is significantly depressed, as she will be going for left BKA, does appear to depressed, she is seeing a therapist as an outpatient, and is asking to talk to psychiatry to see it helps with her emotion.  Ordering Provider: Epifanio Haste, MD  Provider:  (Not yet assigned)  Question Answer Comment  Location [facility]   Reason for Consult? depression      12/15/23 1144             Mode of Visit: In person    Psychiatry Consult Evaluation  Service Date: December 18, 2023 LOS:  LOS: 4 days  Chief Complaint "Not so hot"  Primary Psychiatric Diagnoses  General anxiety disorder   Assessment  Dana [name] is a 63 y.o. female admitted: Medicallyfor 12/14/2023 12:50 AM for history significant for insulin -dependent diabetes mellitus, CKD stage IV, CAD, chronic hypoxic respiratory failure, OSA on CPAP, chronic HFmrEF, depression, anxiety, chronic pain, and left trimalleolar ankle fracture/dislocation status postoperative repair on 11/13/2023 who presented to the [facility] ED the night of 12/12/2023 with fevers, dysuria, and flank pain, history of anxiety.  Her current presentation of excessive worry, feeling overwhelmed, feelings of pending doom, and difficulty concentrating is most consistent with GAD. Current outpatient psychotropic medications include Lexapro, Ativan , gabapentin  and historically she has had a positive response to these medications. She was compliant with medications prior to admission as evidenced by self-report. On initial examination, patient pleasant and easily expressed her thoughts and concerns. Please see plan below for detailed recommendations.   Diagnoses:  Active  Hospital problems: Principal Problem:   Surgical site infection Active Problems:   Chronic combined systolic (congestive) and diastolic (congestive) heart failure (HCC)   Chronic respiratory failure with hypoxia (HCC)   OSA on CPAP   Uncontrolled type 1 diabetes mellitus with hyperglycemia, with long-term current use of insulin  (HCC)   Generalized anxiety disorder   CKD stage 4 due to type 1 diabetes mellitus (HCC)   Coronary artery disease involving native coronary artery of native heart with angina pectoris (HCC)   MRSA bacteremia   Depression with anxiety   UTI (urinary tract infection)   Severe protein-calorie malnutrition (HCC)   Charcot's joint of foot in type 2 diabetes mellitus (HCC)   High pulmonary arterial pressure (HCC)    Plan   ## Psychiatric Medication Recommendations:  - Continue Lexapro 10 mg daily (just started recently) - Continue gabapentin  300 mg TID  - Continue Ativan  0.5 mg BID PRN - Recommend palliative care consult to clarify preferences for full code versus DNR status. - We will continue to follow to provide supportive therapy peripherally, will see after surgery.  ## Medical Decision Making Capacity: Not specifically addressed in this encounter  ## Further Work-up:  -- most recent EKG on 11/11/2023 had QtC of 455 -- Pertinent labwork reviewed earlier this admission includes: chem panel, CBG, EKG, urine   ## Disposition:-- There are no psychiatric contraindications to discharge at this time  ## Behavioral / Environmental: - No specific recommendations at this time.     ## Safety and Observation Level:  - Based on my clinical evaluation, I estimate the patient to  be at low risk of self harm in the current setting. - At this time, we recommend  routine. This decision is based on my review of the chart including patient's history and current presentation, interview of the patient, mental status examination, and consideration of suicide risk including  evaluating suicidal ideation, plan, intent, suicidal or self-harm behaviors, risk factors, and protective factors. This judgment is based on our ability to directly address suicide risk, implement suicide prevention strategies, and develop a safety plan while the patient is in the clinical setting. Please contact our team if there is a concern that risk level has changed.  CSSR Risk Category:C-SSRS RISK CATEGORY: No Risk  Suicide Risk Assessment: Patient has following modifiable risk factors for suicide: none which we are addressing by therapy. Patient has following non-modifiable or demographic risk factors for suicide: none Patient has the following protective factors against suicide: Access to outpatient mental health care, Supportive family, Supportive friends, Cultural, spiritual, or religious beliefs that discourage suicide, Pets in the home, Frustration tolerance, no history of suicide attempts, and no history of NSSIB  Thank you for this consult request. Recommendations have been communicated to the primary team.  We will continue to follow at this time.   Erol Flanagin, MD       History of Present Illness  Relevant Aspects of [facility] Course:  Admitted on 12/14/2023 for history significant for insulin -dependent diabetes mellitus, CKD stage IV, CAD, chronic hypoxic respiratory failure, OSA on CPAP, chronic HFmrEF, depression, anxiety, chronic pain, and left trimalleolar ankle fracture/dislocation status postoperative repair on 11/13/2023 who presented to the [facility] ED the night of 12/12/2023 with fevers, dysuria, and flank pain; history of anxiety  Patient Report:   Patient is seen briefly in preop before surgery.  Patient in good spirits although is understandably anxious.  Brief supportive therapy provided.  No side effects of Lexapro noted.  Patient appeared in good spirits.  Psych ROS:  Depression: low Anxiety:  high Mania (lifetime and current): none Psychosis:  (lifetime and current): none   Review of Systems  Psychiatric/Behavioral:  The patient is nervous/anxious.      Psychiatric and Social History  Psychiatric History:  Information collected from patient and chart  Prev Dx/Sx: anxiety Current Psych Provider: none Home Meds (current): Lexapro, gabapentin , Ativan  Previous Med Trials: none Therapy: none  Prior Psych Hospitalization: none  Prior Self Harm: none Prior Violence: none  Family Psych History: none Family Hx suicide: none  Social History:  Occupational Hx: disability Living Situation: lives with her husband Spiritual Hx: yes Access to weapons/lethal means: none   Substance History No substance abuse history  Exam Findings  Physical Exam: completed by the MD, reviewed Vital Signs:  Temp:  [97.5 F (36.4 C)-98.3 F (36.8 C)] 97.6 F (36.4 C) (06/04 0744) Pulse Rate:  [62-64] 62 (06/04 0744) Resp:  [12-27] 12 (06/04 0744) BP: (106-133)/(42-59) 106/42 (06/04 0744) SpO2:  [99 %] 99 % (06/04 0744) Blood pressure (!) 106/42, pulse 62, temperature 97.6 F (36.4 C), temperature source Oral, resp. rate 12, height 5\' 5"  (1.651 m), weight 98 kg, SpO2 99%. Body mass index is 35.95 kg/m.  Physical Exam Vitals and nursing note reviewed.  Constitutional:      Appearance: Normal appearance.  HENT:     Head: Normocephalic.     Nose: Nose normal.  Pulmonary:     Effort: Pulmonary effort is normal.  Musculoskeletal:     Cervical back: Normal range of motion.  Neurological:  General: No focal deficit present.     Mental Status: She is alert and oriented to person, place, and time.     Mental Status Exam: General Appearance: Casual  Orientation:  Full (Time, Place, and Person)  Memory:  Immediate;   Good Recent;   Good Remote;   Good  Concentration:  Concentration: Good and Attention Span: Good  Recall:  Good  Attention  Good  Eye Contact:  Fair  Speech:  Clear and Coherent  Language:  Good  Volume:   Normal  Mood: Depressed  Affect:  Congruent  Thought Process:  Coherent  Thought Content:  logical  Suicidal Thoughts:  No  Homicidal Thoughts:  No  Judgement:  Good  Insight:  Good  Psychomotor Activity:  Decreased  Akathisia:  No  Fund of Knowledge:  Good      Assets:  Communication Skills Desire for Improvement Housing Intimacy Leisure Time Resilience Social Support  Cognition:  WNL  ADL's:  Impaired  AIMS (if indicated):        Other History   These have been pulled in through the EMR, reviewed, and updated if appropriate.  Family History:  The patient's family history includes Bipolar disorder in an other family member; Breast cancer (age of onset: 58) in her mother; Cancer in an other family member; Coronary artery disease in her mother; Depression in an other family member; Diabetes in her father; Diabetes type II in her mother; Hyperlipidemia in her mother; Hypertension in her father and mother; Lung cancer (age of onset: 60) in her mother; Mental illness in her sister; Schizophrenia in an other family member.  Medical History: Past Medical History:  Diagnosis Date   Acquired absence of left great toe (HCC)    Acquired absence of other left toe(s) (HCC)    ACS (acute coronary syndrome) (HCC)    Acute bronchitis due to COVID-19 virus 03/03/2023   Acute on chronic combined systolic and diastolic CHF (congestive heart failure) (HCC) 09/16/2023   Acute respiratory failure with hypoxia (HCC) 07/20/2023   Anemia    Anoxic brain injury (HCC)    Anxiety    CAD (coronary artery disease)    DES mLAD 04/07/15; DES dRCA & DES pPDA 08/30/16; DES mCX & PTCA OM1 09/29/20   Chronic diastolic (congestive) heart failure (HCC)    Chronic pain    Chronic systolic (congestive) heart failure (HCC)    CKD (chronic kidney disease)    Contrast dye induced nephropathy, possible 09/03/2020   COPD (chronic obstructive pulmonary disease) (HCC)    COVID-19 virus infection 03/03/2023    Demand ischemia (HCC)    Diabetes (HCC)type 1    Diastolic CHF (HCC)    Dyspnea    Encounter for screening mammogram for malignant neoplasm of breast 09/16/2023   Family history of breast cancer 10/23/2021   Gastro-esophageal reflux disease without esophagitis    Hyperlipidemia    Hypertension    Hypertensive heart disease with heart failure (HCC)    Hypoxemia    Idiopathic gout, multiple sites    Leukocytosis 03/29/2022   Localized edema    Low back pain    Mitral regurgitation    Mitral regurgitation    Mixed hyperlipidemia    Mixed incontinence    Morbid (severe) obesity due to excess calories (HCC)    Neuromuscular disorder (HCC)    Neuropathy    Non-ST elevation (NSTEMI) myocardial infarction (HCC)    08/29/20, 09/29/20   OSA (obstructive sleep apnea) 09/03/2020   Other  specified hypothyroidism    PAF (paroxysmal atrial fibrillation) (HCC)    s/p pulmonary vein isolation by cryoablation 10/04/15 Dr. Jearldine Mina in [facility]   PEA (Pulseless electrical activity) [facility])    ~ 01/13/21 at [facility] during admission DKA, volume overload, possible CAP, hypoxia s/p ACLS with ROSC ~ 10-15   Pneumonia    Primary insomnia    Rheumatoid arthritis (HCC)    Stroke (HCC)    Thyroid  disease    Type 1 diabetes mellitus (HCC)    URI with cough and congestion 08/22/2023   Vitamin D  deficiency     Surgical History: Past Surgical History:  Procedure Laterality Date   ABDOMINAL HYSTERECTOMY     Partial- Still has both ovaries   ANKLE FUSION Left 11/13/2023   Procedure: HINDFOOT FUSION FIXATION OF ANKLE;  Surgeon: Laneta Pintos, MD;  Location: [facility] OR;  Service: Orthopedics;  Laterality: Left;   CARDIOVASCULAR STRESS TEST  08/13/2014   Nuclear; Normal   CARPAL TUNNEL RELEASE     CATARACT EXTRACTION, BILATERAL     CESAREAN SECTION     CHOLECYSTECTOMY     CORONARY ANGIOPLASTY WITH STENT PLACEMENT     3 blockages/ 1 stent   CORONARY BALLOON ANGIOPLASTY N/A 07/18/2023   Procedure:  CORONARY BALLOON ANGIOPLASTY;  Surgeon: Odie Benne, MD;  Location: [facility] INVASIVE CV LAB;  Service: Cardiovascular;  Laterality: N/A;   CORONARY PRESSURE/FFR STUDY N/A 09/07/2022   Procedure: INTRAVASCULAR PRESSURE WIRE/FFR STUDY;  Surgeon: Swaziland, Peter M, MD;  Location: [facility] INVASIVE CV LAB;  Service: Cardiovascular;  Laterality: N/A;   CORONARY STENT INTERVENTION N/A 09/29/2020   Procedure: CORONARY STENT INTERVENTION;  Surgeon: Arnoldo Lapping, MD;  Location: [facility] INVASIVE CV LAB;  Service: Cardiovascular;  Laterality: N/A;  CFX   CORONARY STENT INTERVENTION N/A 09/07/2022   Procedure: CORONARY STENT INTERVENTION;  Surgeon: Swaziland, Peter M, MD;  Location: [facility] INVASIVE CV LAB;  Service: Cardiovascular;  Laterality: N/A;   CORONARY STENT INTERVENTION N/A 02/13/2023   Procedure: CORONARY STENT INTERVENTION;  Surgeon: Odie Benne, MD;  Location: [facility] INVASIVE CV LAB;  Service: Cardiovascular;  Laterality: N/A;  LAD   CORONARY ULTRASOUND/IVUS N/A 02/13/2023   Procedure: Coronary Ultrasound/IVUS;  Surgeon: Odie Benne, MD;  Location: [facility] INVASIVE CV LAB;  Service: Cardiovascular;  Laterality: N/A;   CORONARY/GRAFT ACUTE MI REVASCULARIZATION N/A 12/08/2022   Procedure: Coronary/Graft Acute MI Revascularization;  Surgeon: Arleen Lacer, MD;  Location: [facility] INVASIVE CV LAB;  Service: Cardiovascular;  Laterality: N/A;   EYE SURGERY     LEFT HEART CATH AND CORONARY ANGIOGRAPHY N/A 09/29/2020   Procedure: LEFT HEART CATH AND CORONARY ANGIOGRAPHY;  Surgeon: Arnoldo Lapping, MD;  Location: [facility] INVASIVE CV LAB;  Service: Cardiovascular;  Laterality: N/A;   LEFT HEART CATH AND CORONARY ANGIOGRAPHY N/A 09/07/2022   Procedure: LEFT HEART CATH AND CORONARY ANGIOGRAPHY;  Surgeon: Swaziland, Peter M, MD;  Location: [facility] INVASIVE CV LAB;  Service: Cardiovascular;  Laterality: N/A;   LEFT HEART CATH AND CORONARY ANGIOGRAPHY N/A 12/08/2022   Procedure: LEFT HEART CATH AND CORONARY ANGIOGRAPHY;   Surgeon: Arleen Lacer, MD;  Location: [facility] INVASIVE CV LAB;  Service: Cardiovascular;  Laterality: N/A;   LEFT HEART CATH AND CORONARY ANGIOGRAPHY N/A 02/13/2023   Procedure: LEFT HEART CATH AND CORONARY ANGIOGRAPHY;  Surgeon: Odie Benne, MD;  Location: [facility] INVASIVE CV LAB;  Service: Cardiovascular;  Laterality: N/A;   LEFT HEART CATH AND CORONARY ANGIOGRAPHY N/A 07/16/2023   Procedure: LEFT HEART CATH AND  CORONARY ANGIOGRAPHY;  Surgeon: Odie Benne, MD;  Location: [facility] INVASIVE CV LAB;  Service: Cardiovascular;  Laterality: N/A;   RIGHT HEART CATH N/A 04/27/2021   Procedure: RIGHT HEART CATH;  Surgeon: Mardell Shade, MD;  Location: [facility] INVASIVE CV LAB;  Service: Cardiovascular;  Laterality: N/A;   RIGHT/LEFT HEART CATH AND CORONARY ANGIOGRAPHY N/A 01/07/2017   Procedure: Right/Left Heart Cath and Coronary Angiography;  Surgeon: Darlis Eisenmenger, MD;  Location: [facility] INVASIVE CV LAB;  Service: Cardiovascular;  Laterality: N/A;   ROTATOR CUFF REPAIR     TEE WITHOUT CARDIOVERSION N/A 04/28/2021   Procedure: TRANSESOPHAGEAL ECHOCARDIOGRAM (TEE);  Surgeon: Mardell Shade, MD;  Location: [facility] ENDOSCOPY;  Service: Cardiovascular;  Laterality: N/A;   TEE WITHOUT CARDIOVERSION N/A 05/18/2021   Procedure: TRANSESOPHAGEAL ECHOCARDIOGRAM (TEE);  Surgeon: Thukkani, Arun K, MD;  Location: [facility] INVASIVE CV LAB;  Service: Cardiovascular;  Laterality: N/A;   TOE AMPUTATION Left    TRANSCATHETER MITRAL EDGE TO EDGE REPAIR N/A 05/18/2021   Procedure: MITRAL VALVE REPAIR;  Surgeon: Kyra Phy, MD;  Location: [facility] INVASIVE CV LAB;  Service: Cardiovascular;  Laterality: N/A;   US  ECHOCARDIOGRAPHY  05/2017   Normal     Medications:   Current Facility-Administered Medications:    acetaminophen  (TYLENOL ) tablet 650 mg, 650 mg, Oral, Q6H PRN, 650 mg at 12/17/23 2341 **OR** acetaminophen  (TYLENOL ) suppository 650 mg, 650 mg, Rectal, Q6H PRN, Opyd, Santana Cue, MD   allopurinol  (ZYLOPRIM )  tablet 50 mg, 50 mg, Oral, Daily, Opyd, Timothy S, MD, 50 mg at 12/17/23 6045   aspirin  EC tablet 81 mg, 81 mg, Oral, Daily, Elgergawy, Dawood S, MD, 81 mg at 12/17/23 0911   carvedilol  (COREG ) tablet 6.25 mg, 6.25 mg, Oral, BID WC, Elgergawy, Dawood S, MD, 6.25 mg at 12/17/23 1755   ceFAZolin  (ANCEF ) IVPB 1 g/50 mL premix, 1 g, Intravenous, Q8H, Liane Redman, MD, Last Rate: 100 mL/hr at 12/18/23 0521, 1 g at 12/18/23 4098   ceFAZolin  (ANCEF ) IVPB 2g/100 mL premix, 2 g, Intravenous, On Call to OR, Hazeline Lister, Emma M, PA-C   Chlorhexidine  Gluconate Cloth 2 % PADS 6 each, 6 each, Topical, Daily, Elgergawy, Ardia Kraft, MD, 6 each at 12/17/23 0914   escitalopram (LEXAPRO) tablet 10 mg, 10 mg, Oral, Daily, Elgergawy, Dawood S, MD, 10 mg at 12/17/23 1191   furosemide  (LASIX ) injection 60 mg, 60 mg, Intravenous, BID, Elgergawy, Dawood S, MD, 60 mg at 12/17/23 1754   gabapentin  (NEURONTIN ) capsule 300 mg, 300 mg, Oral, TID, Opyd, Timothy S, MD, 300 mg at 12/17/23 2115   heparin  injection 5,000 Units, 5,000 Units, Subcutaneous, Q8H, Elgergawy, Ardia Kraft, MD, 5,000 Units at 12/18/23 4782   HYDROmorphone  (DILAUDID ) injection 0.5 mg, 0.5 mg, Intravenous, Q4H PRN, Elgergawy, Ardia Kraft, MD, 0.5 mg at 12/18/23 0517   insulin  aspart (novoLOG ) injection 0-9 Units, 0-9 Units, Subcutaneous, Q4H, Opyd, Timothy S, MD, 1 Units at 12/18/23 0004   insulin  glargine-yfgn (SEMGLEE ) injection 10 Units, 10 Units, Subcutaneous, Daily, Opyd, Timothy S, MD, 10 Units at 12/17/23 0912   latanoprost  (XALATAN ) 0.005 % ophthalmic solution 1 drop, 1 drop, Both Eyes, QHS, Elgergawy, Ardia Kraft, MD, 1 drop at 12/17/23 2116   LORazepam  (ATIVAN ) tablet 0.5 mg, 0.5 mg, Oral, QID PRN, Elgergawy, Dawood S, MD, 0.5 mg at 12/18/23 0328   morphine  (MS CONTIN ) 12 hr tablet 15 mg, 15 mg, Oral, Q12H, Elgergawy, Dawood S, MD, 15 mg at 12/17/23 2116   oxyCODONE  (Oxy IR/ROXICODONE ) immediate release tablet 5 mg, 5  mg, Oral, Q4H PRN, Opyd, Timothy S, MD, 5 mg  at 12/18/23 0344   pantoprazole  (PROTONIX ) EC tablet 40 mg, 40 mg, Oral, Daily, Opyd, Timothy S, MD, 40 mg at 12/17/23 0911   povidone-iodine 10 % swab 2 Application, 2 Application, Topical, Once, Collins, Emma M, PA-C   prochlorperazine  (COMPAZINE ) injection 5 mg, 5 mg, Intravenous, Q6H PRN, Opyd, Timothy S, MD, 5 mg at 12/18/23 1191   senna (SENOKOT) tablet 8.6 mg, 1 tablet, Oral, Daily PRN, Opyd, Timothy S, MD, 8.6 mg at 12/16/23 1720   sodium chloride  flush (NS) 0.9 % injection 10-40 mL, 10-40 mL, Intracatheter, PRN, Elgergawy, Dawood S, MD   sodium chloride  flush (NS) 0.9 % injection 3 mL, 3 mL, Intravenous, Q12H, Opyd, Timothy S, MD, 3 mL at 12/17/23 2116   tranexamic acid  (CYKLOKAPRON ) 2,000 mg in sodium chloride  0.9 % 50 mL Topical Application, 2,000 mg, Topical, To OR, Collins, Emma M, PA-C   tranexamic acid  (CYKLOKAPRON ) IVPB 1,000 mg, 1,000 mg, Intravenous, To OR, Collins, Emma M, PA-C   vancomycin  (VANCOREADY) IVPB 1250 mg/250 mL, 1,250 mg, Intravenous, Q48H, Elgergawy, Ardia Kraft, MD, Last Rate: 166.7 mL/hr at 12/18/23 0651, 1,250 mg at 12/18/23 0651  Allergies: Allergies  Allergen Reactions   Naproxen Hives and Rash   Shellfish-Derived Products Hives, Swelling and Other (See Comments)    Angioedema    Strawberry (Diagnostic) Hives   Celecoxib Other (See Comments)    Stomach pain   Doxepin  Hcl Other (See Comments)    Lethargic, sleeping a lot per pt and family   Glucosamine Hives, Swelling and Other (See Comments)    Angioedema    Iron  Hives and Other (See Comments)    IV iron  transfusion - hives - Feb 2022 hospitalization  Patient said she CAN tolerate if given Benadryl  first   Metoclopramide Other (See Comments)    Facial twitching and stuttering   Metolazone  Other (See Comments)    Acute renal failure   Statins Other (See Comments)    Muscle pain    Gwyndolyn Lerner, MD

## 2023-12-18 NOTE — Anesthesia Procedure Notes (Signed)
 Procedure Name: LMA Insertion Date/Time: 12/18/2023 10:43 AM  Performed by: Dawna Etienne, CRNAPre-anesthesia Checklist: Patient identified, Emergency Drugs available, Suction available and Patient being monitored Patient Re-evaluated:Patient Re-evaluated prior to induction Oxygen  Delivery Method: Circle system utilized Preoxygenation: Pre-oxygenation with 100% oxygen  Induction Type: IV induction Ventilation: Mask ventilation without difficulty LMA: LMA flexible inserted LMA Size: 4.0 Number of attempts: 1 Placement Confirmation: positive ETCO2 and breath sounds checked- equal and bilateral Tube secured with: Tape Dental Injury: Teeth and Oropharynx as per pre-operative assessment

## 2023-12-18 NOTE — Progress Notes (Signed)
 [facility]  Date of Admission:  12/14/2023     Reason for Follow Up: Surgical site infection  Total days of antibiotics 6         ASSESSMENT:  Dana [name] blood cultures remain without growth to date in the setting of MRSA bacteremia with left ankle infection now s/p left below knee amputation and E. Coli pyelonephritis. Source control likely achieved with surgery and will need TEE to rule out endocarditis. Continue current dose of vancomycin . Monitor cultures until finalized for clearance of bacteremia. Therapeutic drug monitoring of renal function and vancomycin  levels. Anxiety managed by Psychiatry. Contact precautions for MRSA. Remaining medical and supportive care per Internal Medicine.   PLAN:  Continue current dose of vancomycin  and cefazolin .  Therapeutic drug monitoring of renal function and vancomycin  levels.  Monitor cultures for clearance of bacteremia. Post-operative wound care per Orthopedics. Will need TEE to rule out endocarditis.  Contact precautions for MRSA. Remaining medical and supportive care per Internal Medicine.   Principal Problem:   Surgical site infection Active Problems:   Chronic combined systolic (congestive) and diastolic (congestive) heart failure (HCC)   Chronic respiratory failure with hypoxia (HCC)   OSA on CPAP   Uncontrolled type 1 diabetes mellitus with hyperglycemia, with long-term current use of insulin  (HCC)   Generalized anxiety disorder   CKD stage 4 due to type 1 diabetes mellitus (HCC)   Coronary artery disease involving native coronary artery of native heart with angina pectoris (HCC)   MRSA bacteremia   Depression with anxiety   UTI (urinary tract infection)   Severe protein-calorie malnutrition (HCC)   Charcot's joint of foot in type 2 diabetes mellitus (HCC)   High pulmonary arterial pressure (HCC)    allopurinol   50 mg Oral Daily   aspirin  EC  81 mg Oral Daily   carvedilol   6.25 mg Oral BID  WC   Chlorhexidine  Gluconate Cloth  6 each Topical Daily   escitalopram  10 mg Oral Daily   furosemide   60 mg Intravenous BID   gabapentin   300 mg Oral TID   heparin  injection (subcutaneous)  5,000 Units Subcutaneous Q8H   insulin  aspart  0-9 Units Subcutaneous Q4H   insulin  glargine-yfgn  10 Units Subcutaneous Daily   latanoprost   1 drop Both Eyes QHS   morphine   15 mg Oral Q12H   pantoprazole   40 mg Oral Daily   polyethylene glycol  17 g Oral Daily   senna  2 tablet Oral QHS   sodium chloride  flush  3 mL Intravenous Q12H    SUBJECTIVE:  Afebrile overnight with no acute events. Tolerating antibiotics with no adverse side effects.   Allergies  Allergen Reactions   Naproxen Hives and Rash   Shellfish-Derived Products Hives, Swelling and Other (See Comments)    Angioedema    Strawberry (Diagnostic) Hives   Celecoxib Other (See Comments)    Stomach pain   Doxepin  Hcl Other (See Comments)    Lethargic, sleeping a lot per pt and family   Glucosamine Hives, Swelling and Other (See Comments)    Angioedema    Iron  Hives and Other (See Comments)    IV iron  transfusion - hives - Feb 2022 hospitalization  Patient said she CAN tolerate if given Benadryl  first   Metoclopramide Other (See Comments)    Facial twitching and stuttering   Metolazone  Other (See Comments)    Acute renal failure   Statins Other (See Comments)    Muscle pain  Review of Systems: Review of Systems  Constitutional:  Negative for chills, fever and weight loss.  Respiratory:  Negative for cough, shortness of breath and wheezing.   Cardiovascular:  Negative for chest pain and leg swelling.  Gastrointestinal:  Negative for abdominal pain, constipation, diarrhea, nausea and vomiting.  Skin:  Negative for rash.      OBJECTIVE: Vitals:   12/18/23 1200 12/18/23 1215 12/18/23 1230 12/18/23 1312  BP: (!) 112/54 (!) 123/58 (!) 122/56 (!) 125/52  Pulse: 62 60 60 61  Resp: 12 11 13 11   Temp:   97.7 F  (36.5 C) 97.6 F (36.4 C)  TempSrc:    Oral  SpO2: 93% 99% 99% 97%  Weight:      Height:       Body mass index is 35.95 kg/m.  Physical Exam Constitutional:      General: She is not in acute distress.    Appearance: She is well-developed.  Cardiovascular:     Rate and Rhythm: Normal rate and regular rhythm.     Heart sounds: Normal heart sounds.  Pulmonary:     Effort: Pulmonary effort is normal.     Breath sounds: Normal breath sounds.  Musculoskeletal:     Comments: Wound vac in place  Skin:    General: Skin is warm and dry.  Neurological:     Mental Status: She is alert and oriented to person, place, and time.  Psychiatric:        Mood and Affect: Mood normal.     Lab Results Lab Results  Component Value Date   WBC 13.4 (H) 12/18/2023   HGB 9.5 (L) 12/18/2023   HCT 30.4 (L) 12/18/2023   MCV 87.6 12/18/2023   PLT 280 12/18/2023    Lab Results  Component Value Date   CREATININE 2.05 (H) 12/18/2023   BUN 57 (H) 12/18/2023   NA 135 12/18/2023   K 4.5 12/18/2023   CL 99 12/18/2023   CO2 25 12/18/2023    Lab Results  Component Value Date   ALT 11 12/14/2023   AST 11 (L) 12/14/2023   ALKPHOS 144 (H) 12/14/2023   BILITOT 0.6 12/14/2023     Microbiology: Recent Results (from the past 240 hours)  Urine Culture (for pregnant, neutropenic or urologic patients or patients with an indwelling urinary catheter)     Status: Abnormal   Collection Time: 12/14/23  1:56 AM   Specimen: Urine, Clean Catch  Result Value Ref Range Status   Specimen Description URINE, CLEAN CATCH  Final   Special Requests NONE  Final   Culture (A)  Final    <10,000 COLONIES/mL INSIGNIFICANT GROWTH Performed at [facility], [address], [place], [place] [address]    Report Status 12/15/2023 FINAL  Final  Culture, blood (x 2)     Status: None (Preliminary result)   Collection Time: 12/14/23  2:14 AM   Specimen: BLOOD RIGHT ARM  Result Value Ref Range Status   Specimen  Description BLOOD RIGHT ARM  Final   Special Requests   Final    BOTTLES DRAWN AEROBIC AND ANAEROBIC Blood Culture adequate volume   Culture   Final    NO GROWTH 4 DAYS Performed at [facility], [address], [place], [place] [address]    Report Status PENDING  Incomplete  Culture, blood (x 2)     Status: None (Preliminary result)   Collection Time: 12/14/23  2:14 AM   Specimen: BLOOD RIGHT HAND  Result Value Ref Range Status   Specimen Description BLOOD RIGHT HAND  Final   Special Requests   Final    BOTTLES DRAWN AEROBIC AND ANAEROBIC Blood Culture adequate volume   Culture   Final    NO GROWTH 4 DAYS Performed at [facility], [address], [place], [place] [address]    Report Status PENDING  Incomplete  Surgical pcr screen     Status: None   Collection Time: 12/18/23  9:15 AM   Specimen: Nasal Mucosa; Nasal Swab  Result Value Ref Range Status   MRSA, PCR NEGATIVE NEGATIVE Final   Staphylococcus aureus NEGATIVE NEGATIVE Final    Comment: (NOTE) The Xpert SA Assay (FDA approved for NASAL specimens in patients 39 years of age and older), is one component of a comprehensive surveillance program. It is not intended to diagnose infection nor to guide or monitor treatment. Performed at [facility], [address] [place], [place] [address]     I have personally spent 30 minutes involved in face-to-face and non-face-to-face activities for this patient on the day of the visit. Professional time spent includes the following activities: Preparing to see the patient (review of tests), performing a medically appropriate examination, Ordering medications/tests/procedures, communicating with other health care professionals, documenting clinical information in the EMR, Independently interpreting results, Communicating results and counseling patient and family on plan of care, and care coordination.   Greg Tyanna Hach, NP [facility]  12/18/2023  2:21 PM

## 2023-12-18 NOTE — Anesthesia Postprocedure Evaluation (Signed)
 Anesthesia Post Note  Patient: Dana [name]  Procedure(s) Performed: AMPUTATION BELOW KNEE (Left: Knee)     Patient location during evaluation: PACU Anesthesia Type: General Level of consciousness: awake and alert Pain management: pain level controlled Vital Signs Assessment: post-procedure vital signs reviewed and stable Respiratory status: spontaneous breathing, nonlabored ventilation, respiratory function stable and patient connected to nasal cannula oxygen  Cardiovascular status: blood pressure returned to baseline and stable Postop Assessment: no apparent nausea or vomiting Anesthetic complications: no  No notable events documented.  Last Vitals:  Vitals:   12/18/23 1230 12/18/23 1312  BP: (!) 122/56 (!) 125/52  Pulse: 60 61  Resp: 13 11  Temp: 36.5 C 36.4 C  SpO2: 99% 97%    Last Pain:  Vitals:   12/18/23 1312  TempSrc: Oral  PainSc:                  [name] D Tyreon Frigon

## 2023-12-18 NOTE — Progress Notes (Signed)
 PROGRESS NOTE        PATIENT DETAILS Name: Dana [name] Age: 63 y.o. Sex: female Date of Birth: 05/01/1961 Admit Date: 12/14/2023 Admitting Physician Walton Guppy, MD PCP:Cox, Burleigh Carp, MD  Brief Summary: Patient is a 63 y.o.  female with history of DM-2, CKD 4, CAD, HFmrEF, OSA on CPAP-is s/p ORIF left ankle fracture/dislocation on 4/30-presented as a transfer from [facility] for left ankle infection and MRSA bacteremia.  Significant events: 5/31>> transferred from [facility]-MRSA bacteremia/left ankle infection 6/04>> left BKA.  Significant studies: 5/31>> CT abdomen/pelvis: No hydronephrosis, nonspecific bilateral perinephric fat stranding, enlarged left ovary with surrounding inflammatory stranding. 5/31>> pelvic ultrasound: S/p hysterectomy. 6/01>> TTE: EF 40-45%, s/p mitral clip-mild to moderate MR. 601>> B/L lower extremity Doppler: No DVT. 6/02>> VQ scan: No PE.  Significant microbiology data: 5/31>> blood culture: No growth  Procedures: 6/4>> left BKA.  Consults: Ortho recs ID  Subjective: Seen earlier this morning-before BKA-somewhat anxious but in no major issues.  Objective: Vitals: Blood pressure (!) 122/56, pulse 60, temperature 97.7 F (36.5 C), resp. rate 13, height 5\' 5"  (1.651 m), weight 98 kg, SpO2 99%.   Exam: Gen Exam:Alert awake-not in any distress HEENT:atraumatic, normocephalic Chest: B/L clear to auscultation anteriorly CVS:S1S2 regular Abdomen:soft non tender, non distended Extremities:no edema Neurology: Non focal Skin: no rash  Pertinent Labs/Radiology:    Latest Ref Rng & Units 12/18/2023    3:51 AM 12/17/2023    4:09 AM 12/16/2023    3:15 AM  CBC  WBC 4.0 - 10.5 K/uL 13.4  14.9  11.0   Hemoglobin 12.0 - 15.0 g/dL 9.5  9.4  9.2   Hematocrit 36.0 - 46.0 % 30.4  30.0  28.9   Platelets 150 - 400 K/uL 280  278  250     Lab Results  Component Value Date   NA 135 12/18/2023   K 4.5 12/18/2023    CL 99 12/18/2023   CO2 25 12/18/2023      Assessment/Plan: Severe sepsis secondary to secondary to postoperative left ankle infection and MRSA bacteremia with bilateral pyelonephritis. Sepsis visit has resolved Repeat cultures negative so far Remains on IV vancomycin  S/p left BKA 6/4 TEE planned over the next several days.  Volume overload secondary to right-sided heart failure and acute on HFrEF Volume status improved with diuretics Cardiology following.  CAD-s/p PCI Continue aspirin  Resume Brilinta  when okay with orthopedics Remains on Coreg  On Repatha  at home.  History of mitral regurgitation-s/p MitraClip 2022 Mild to moderate MR on echo Await TEE  CKD 4 Close to baseline Trend electrolytes periodically  Normocytic anemia Secondary to combination of CKD/acute illness-no evidence of blood loss. Hb stable-s/p 3 units of PRBC so far-last transfused on 6/1.  DM-2 (A1c 10.16 October 2023) CBG stable Semglee  10 units daily+ SSI  Depression/anxiety Somewhat anxious this morning for BKA Remains on Lexapro/gabapentin  As needed Ativan  Psych following.  Class 2 Obesity: Estimated body mass index is 35.95 kg/m as calculated from the following:   Height as of this encounter: 5\' 5"  (1.651 m).   Weight as of this encounter: 98 kg.   Code status:   Code Status: Full Code   DVT Prophylaxis: heparin  injection 5,000 Units Start: 12/14/23 1400 SCDs Start: 12/14/23 0119   Family Communication: Spouse at bedside   Disposition Plan: Status is: Inpatient Remains inpatient appropriate because:  Severity of illness   Planned Discharge Destination:Rehabilitation facility   Diet: Diet Order             Diet NPO time specified  Diet effective midnight                     Antimicrobial agents: Anti-infectives (From admission, onward)    Start     Dose/Rate Route Frequency Ordered Stop   12/18/23 0600  vancomycin  (VANCOREADY) IVPB 1250 mg/250 mL        1,250  mg 166.7 mL/hr over 90 Minutes Intravenous Every 48 hours 12/17/23 1452     12/18/23 0600  ceFAZolin  (ANCEF ) IVPB 2g/100 mL premix  Status:  Discontinued        2 g 200 mL/hr over 30 Minutes Intravenous On call to O.R. 12/17/23 2121 12/18/23 1254   12/16/23 1400  ceFAZolin  (ANCEF ) IVPB 1 g/50 mL premix        1 g 100 mL/hr over 30 Minutes Intravenous Every 8 hours 12/16/23 0937     12/16/23 0600  Vancomycin  (VANCOCIN ) 1,250 mg in sodium chloride  0.9 % 250 mL IVPB  Status:  Discontinued        1,250 mg 166.7 mL/hr over 90 Minutes Intravenous Every 48 hours 12/14/23 1009 12/17/23 1452   12/14/23 1800  piperacillin-tazobactam (ZOSYN) IVPB 3.375 g  Status:  Discontinued        3.375 g 12.5 mL/hr over 240 Minutes Intravenous Every 8 hours 12/14/23 1710 12/16/23 0940   12/14/23 0600  vancomycin  (VANCOCIN ) IVPB 1000 mg/200 mL premix  Status:  Discontinued        1,000 mg 200 mL/hr over 60 Minutes Intravenous Every 24 hours 12/14/23 0210 12/14/23 1009   12/14/23 0215  vancomycin  (VANCOCIN ) IVPB 1000 mg/200 mL premix  Status:  Discontinued        1,000 mg 200 mL/hr over 60 Minutes Intravenous  Once 12/14/23 0122 12/14/23 0130   12/14/23 0215  cefTRIAXone  (ROCEPHIN ) 2 g in sodium chloride  0.9 % 100 mL IVPB  Status:  Discontinued        2 g 200 mL/hr over 30 Minutes Intravenous Daily at bedtime 12/14/23 0155 12/14/23 1703        MEDICATIONS: Scheduled Meds:  allopurinol   50 mg Oral Daily   aspirin  EC  81 mg Oral Daily   carvedilol   6.25 mg Oral BID WC   Chlorhexidine  Gluconate Cloth  6 each Topical Daily   escitalopram  10 mg Oral Daily   furosemide   60 mg Intravenous BID   gabapentin   300 mg Oral TID   heparin  injection (subcutaneous)  5,000 Units Subcutaneous Q8H   insulin  aspart  0-9 Units Subcutaneous Q4H   insulin  glargine-yfgn  10 Units Subcutaneous Daily   latanoprost   1 drop Both Eyes QHS   morphine   15 mg Oral Q12H   pantoprazole   40 mg Oral Daily   sodium chloride  flush  3  mL Intravenous Q12H   Continuous Infusions:   ceFAZolin  (ANCEF ) IV 1 g (12/18/23 0521)   vancomycin  1,250 mg (12/18/23 0651)   PRN Meds:.acetaminophen  **OR** acetaminophen , HYDROmorphone  (DILAUDID ) injection, LORazepam , oxyCODONE , prochlorperazine , senna, sodium chloride  flush   I have personally reviewed following labs and imaging studies  LABORATORY DATA: CBC: Recent Labs  Lab 12/14/23 0214 12/15/23 0253 12/16/23 0315 12/17/23 0409 12/18/23 0351  WBC 19.6* 14.7* 11.0* 14.9* 13.4*  HGB 7.2* 7.5* 9.2* 9.4* 9.5*  HCT 23.5* 23.9* 28.9* 30.0* 30.4*  MCV 88.3 87.5 85.0  85.7 87.6  PLT 265 255 250 278 280    Basic Metabolic Panel: Recent Labs  Lab 12/14/23 0214 12/15/23 0253 12/16/23 0315 12/17/23 0409 12/18/23 0351  NA 134* 133* 133* 135 135  K 4.3 4.4 4.3 4.3 4.5  CL 98 101 102 102 99  CO2 22 21* 21* 24 25  GLUCOSE 137* 134* 111* 159* 80  BUN 50* 54* 55* 52* 57*  CREATININE 2.25* 2.28* 2.16* 2.06* 2.05*  CALCIUM  8.4* 8.1* 8.1* 8.4* 8.8*  MG 2.1  --   --   --   --     GFR: Estimated Creatinine Clearance: 33.8 mL/min (A) (by C-G formula based on SCr of 2.05 mg/dL (H)).  Liver Function Tests: Recent Labs  Lab 12/14/23 0214  AST 11*  ALT 11  ALKPHOS 144*  BILITOT 0.6  PROT 6.5  ALBUMIN  2.1*   No results for input(s): "LIPASE", "AMYLASE" in the last 168 hours. No results for input(s): "AMMONIA" in the last 168 hours.  Coagulation Profile: No results for input(s): "INR", "PROTIME" in the last 168 hours.  Cardiac Enzymes: No results for input(s): "CKTOTAL", "CKMB", "CKMBINDEX", "TROPONINI" in the last 168 hours.  BNP (last 3 results) No results for input(s): "PROBNP" in the last 8760 hours.  Lipid Profile: No results for input(s): "CHOL", "HDL", "LDLCALC", "TRIG", "CHOLHDL", "LDLDIRECT" in the last 72 hours.  Thyroid  Function Tests: No results for input(s): "TSH", "T4TOTAL", "FREET4", "T3FREE", "THYROIDAB" in the last 72 hours.  Anemia Panel: No  results for input(s): "VITAMINB12", "FOLATE", "FERRITIN", "TIBC", "IRON ", "RETICCTPCT" in the last 72 hours.  Urine analysis:    Component Value Date/Time   COLORURINE YELLOW 12/14/2023 0631   APPEARANCEUR CLOUDY (A) 12/14/2023 0631   LABSPEC 1.015 12/14/2023 0631   PHURINE 5.0 12/14/2023 0631   GLUCOSEU NEGATIVE 12/14/2023 0631   HGBUR SMALL (A) 12/14/2023 0631   BILIRUBINUR NEGATIVE 12/14/2023 0631   BILIRUBINUR negative 10/22/2023 0916   BILIRUBINUR negative 07/11/2021 0812   KETONESUR NEGATIVE 12/14/2023 0631   PROTEINUR 30 (A) 12/14/2023 0631   UROBILINOGEN 0.2 10/22/2023 0916   NITRITE NEGATIVE 12/14/2023 0631   LEUKOCYTESUR MODERATE (A) 12/14/2023 0631    Sepsis Labs: Lactic Acid, Venous    Component Value Date/Time   LATICACIDVEN 1.2 12/14/2023 0214    MICROBIOLOGY: Recent Results (from the past 240 hours)  Urine Culture (for pregnant, neutropenic or urologic patients or patients with an indwelling urinary catheter)     Status: Abnormal   Collection Time: 12/14/23  1:56 AM   Specimen: Urine, Clean Catch  Result Value Ref Range Status   Specimen Description URINE, CLEAN CATCH  Final   Special Requests NONE  Final   Culture (A)  Final    <10,000 COLONIES/mL INSIGNIFICANT GROWTH Performed at [facility], [address], [place], [place] [address]    Report Status 12/15/2023 FINAL  Final  Culture, blood (x 2)     Status: None (Preliminary result)   Collection Time: 12/14/23  2:14 AM   Specimen: BLOOD RIGHT ARM  Result Value Ref Range Status   Specimen Description BLOOD RIGHT ARM  Final   Special Requests   Final    BOTTLES DRAWN AEROBIC AND ANAEROBIC Blood Culture adequate volume   Culture   Final    NO GROWTH 3 DAYS Performed at [facility], [address] [place], [place] [address]    Report Status PENDING  Incomplete  Culture, blood (x 2)     Status: None (Preliminary result)  Collection Time: 12/14/23  2:14 AM   Specimen: BLOOD RIGHT HAND   Result Value Ref Range Status   Specimen Description BLOOD RIGHT HAND  Final   Special Requests   Final    BOTTLES DRAWN AEROBIC AND ANAEROBIC Blood Culture adequate volume   Culture   Final    NO GROWTH 3 DAYS Performed at [facility], [address], [place], [place] [address]    Report Status PENDING  Incomplete  Surgical pcr screen     Status: None   Collection Time: 12/18/23  9:15 AM   Specimen: Nasal Mucosa; Nasal Swab  Result Value Ref Range Status   MRSA, PCR NEGATIVE NEGATIVE Final   Staphylococcus aureus NEGATIVE NEGATIVE Final    Comment: (NOTE) The Xpert SA Assay (FDA approved for NASAL specimens in patients 26 years of age and older), is one component of a comprehensive surveillance program. It is not intended to diagnose infection nor to guide or monitor treatment. Performed at [facility], [address] [place], [place] [address]     RADIOLOGY STUDIES/RESULTS: No results found.   LOS: 4 days   Kimberly Penna, MD  [facility]    To contact the attending provider between 7A-7P or the covering provider during after hours 7P-7A, please log into the web site [email or web address] and access using universal [place] password for that web site. If you do not have the password, please call the hospital operator.  12/18/2023, 12:58 PM

## 2023-12-18 NOTE — Progress Notes (Signed)
 Pharmacy Antibiotic Note  Dana [name] is a 63 y.o. female admitted on 12/14/2023 with MRSA bacteremia seeded from L-heel wound. Also noted to have concern for UTI/pyelo.  Pharmacy has been consulted for Vancomycin  + Cefazolin  dosing.  The patient has underlying CKD with SCr fluctuations noted between 2-2.5, on the lower end of that range today at 2.05.   The patient has been on q48h Vancomycin  dosing and is not at steady state yet however a level was checked this AM to ensure appropriately clearing. VR 21 mcg/ml this AM ~46 hours after the last dose. Will continue current dosing for now and plan to check peak/trough at steady state.   Plan: - Continue Vancomycin  1250 mg IV every 48 hours - Will plan to check peak/trough at steady state - Will continue to follow renal function, culture results, LOT, and antibiotic de-escalation plans   Height: 5\' 5"  (165.1 cm) Weight: 98 kg (216 lb 0.8 oz) (at [facility]) IBW/kg (Calculated) : 57  Temp (24hrs), Avg:97.8 F (36.6 C), Min:97.5 F (36.4 C), Max:98.3 F (36.8 C)  Recent Labs  Lab 12/14/23 0214 12/15/23 0253 12/15/23 1359 12/16/23 0315 12/17/23 0409 12/18/23 0351  WBC 19.6* 14.7*  --  11.0* 14.9* 13.4*  CREATININE 2.25* 2.28*  --  2.16* 2.06* 2.05*  LATICACIDVEN 1.2  --   --   --   --   --   VANCORANDOM  --   --  21  --   --  21    Estimated Creatinine Clearance: 33.8 mL/min (A) (by C-G formula based on SCr of 2.05 mg/dL (H)).    Allergies  Allergen Reactions   Naproxen Hives and Rash   Shellfish-Derived Products Hives, Swelling and Other (See Comments)    Angioedema    Strawberry (Diagnostic) Hives   Celecoxib Other (See Comments)    Stomach pain   Doxepin  Hcl Other (See Comments)    Lethargic, sleeping a lot per pt and family   Glucosamine Hives, Swelling and Other (See Comments)    Angioedema    Iron  Hives and Other (See Comments)    IV iron  transfusion - hives - Feb 2022 hospitalization  Patient said she  CAN tolerate if given Benadryl  first   Metoclopramide Other (See Comments)    Facial twitching and stuttering   Metolazone  Other (See Comments)    Acute renal failure   Statins Other (See Comments)    Muscle pain    Antimicrobials this admission: Rocephin  5/31 x 1 Vancomycin  5/31 >> Zosyn 5/31 >> 6/2 Cefazolin  6/2 >>  Dose adjustments this admission: 6/1 VR 21 mcg/ml 6/4 VR 21 mcg/ml  Microbiology results: 5/29 [facility]) UCx >> E.coli (pan-sensitive) 5/30 [facility]) BCx >> MRSA 5/31 UCx >> <10k insignificant 5/31 BCx >> [id number] 6/4 MRSA PCR >> neg  Thank you for allowing pharmacy to be a part of this patient's care.  Garland Junk, PharmD, BCPS, BCIDP Infectious Diseases Clinical Pharmacist 12/18/2023 2:31 PM   **Pharmacist phone directory can now be found on [email or web address] ([email or web address]).  Listed under [facility].

## 2023-12-18 NOTE — Plan of Care (Signed)
  Problem: Health Behavior/Discharge Planning: Goal: Ability to manage health-related needs will improve Outcome: Progressing   Problem: Clinical Measurements: Goal: Ability to maintain clinical measurements within normal limits will improve Outcome: Progressing   Problem: Nutrition: Goal: Adequate nutrition will be maintained Outcome: Progressing   Problem: Coping: Goal: Level of anxiety will decrease Outcome: Progressing   Problem: Pain Managment: Goal: General experience of comfort will improve and/or be controlled Outcome: Progressing   Problem: Safety: Goal: Ability to remain free from injury will improve Outcome: Progressing   Problem: Skin Integrity: Goal: Risk for impaired skin integrity will decrease Outcome: Progressing   Problem: Coping: Goal: Ability to adjust to condition or change in health will improve Outcome: Progressing

## 2023-12-18 NOTE — Progress Notes (Signed)
   Brief Palliative Medicine Progress Note:  PMT consult received and chart reviewed.  Recommendations:  Extensive chart review completed with plan for PMT to see on 12/18/2023 for GOC/ACP/anticipatory care needs discussion.  However, it appears patient is currently in the OR undergoing left BKA.  Will plan to follow from periphery today and plan to follow-up in person for goals of care/advance care planning discussion on 12/19/2023 as able.  Thank you for allowing us  to participate in the care of Dana [name]  Signed by: Jenelle Mis, NP Palliative Medicine Team [facility] CHARGE  Team Phone # [phone] (Nights/Weekends)  12/18/2023, 12:38 PM

## 2023-12-18 NOTE — Transfer of Care (Signed)
 Immediate Anesthesia Transfer of Care Note  Patient: Dana [name]  Procedure(s) Performed: AMPUTATION BELOW KNEE (Left: Knee)  Patient Location: PACU  Anesthesia Type:GA combined with regional for post-op pain  Level of Consciousness: awake, alert , patient cooperative, and responds to stimulation  Airway & Oxygen  Therapy: Patient Spontanous Breathing and Patient connected to face mask oxygen   Post-op Assessment: Report given to RN and Post -op Vital signs reviewed and stable  Post vital signs: Reviewed and stable  Last Vitals:  Vitals Value Taken Time  BP    Temp    Pulse 64 12/18/23 1134  Resp    SpO2 100 % 12/18/23 1134  Vitals shown include unfiled device data.  Last Pain:  Vitals:   12/18/23 0859  TempSrc: Oral  PainSc: 7       Patients Stated Pain Goal: 0 (12/17/23 1549)  Complications: No notable events documented.

## 2023-12-19 ENCOUNTER — Encounter (HOSPITAL_COMMUNITY): Payer: Self-pay | Admitting: Orthopedic Surgery

## 2023-12-19 DIAGNOSIS — I2721 Secondary pulmonary arterial hypertension: Secondary | ICD-10-CM | POA: Diagnosis not present

## 2023-12-19 DIAGNOSIS — R7881 Bacteremia: Secondary | ICD-10-CM | POA: Diagnosis not present

## 2023-12-19 DIAGNOSIS — Z515 Encounter for palliative care: Secondary | ICD-10-CM | POA: Diagnosis not present

## 2023-12-19 DIAGNOSIS — G8918 Other acute postprocedural pain: Secondary | ICD-10-CM

## 2023-12-19 DIAGNOSIS — F419 Anxiety disorder, unspecified: Secondary | ICD-10-CM

## 2023-12-19 DIAGNOSIS — Z89512 Acquired absence of left leg below knee: Secondary | ICD-10-CM | POA: Diagnosis not present

## 2023-12-19 DIAGNOSIS — T8149XA Infection following a procedure, other surgical site, initial encounter: Secondary | ICD-10-CM | POA: Diagnosis not present

## 2023-12-19 DIAGNOSIS — Z7189 Other specified counseling: Secondary | ICD-10-CM | POA: Diagnosis not present

## 2023-12-19 DIAGNOSIS — I5042 Chronic combined systolic (congestive) and diastolic (congestive) heart failure: Secondary | ICD-10-CM | POA: Diagnosis not present

## 2023-12-19 DIAGNOSIS — E1022 Type 1 diabetes mellitus with diabetic chronic kidney disease: Secondary | ICD-10-CM | POA: Diagnosis not present

## 2023-12-19 DIAGNOSIS — N184 Chronic kidney disease, stage 4 (severe): Secondary | ICD-10-CM | POA: Diagnosis not present

## 2023-12-19 LAB — CULTURE, BLOOD (ROUTINE X 2)
Culture: NO GROWTH
Culture: NO GROWTH
Special Requests: ADEQUATE
Special Requests: ADEQUATE

## 2023-12-19 LAB — BASIC METABOLIC PANEL WITH GFR
Anion gap: 11 (ref 5–15)
BUN: 59 mg/dL — ABNORMAL HIGH (ref 6–20)
CO2: 25 mmol/L (ref 22–32)
Calcium: 8.5 mg/dL — ABNORMAL LOW (ref 8.9–10.3)
Chloride: 97 mmol/L — ABNORMAL LOW (ref 98–111)
Creatinine, Ser: 2.25 mg/dL — ABNORMAL HIGH (ref 0.44–1.00)
GFR, Estimated: 24 mL/min — ABNORMAL LOW (ref >60)
Glucose, Bld: 238 mg/dL — ABNORMAL HIGH (ref 70–99)
Potassium: 4.6 mmol/L (ref 3.5–5.1)
Sodium: 133 mmol/L — ABNORMAL LOW (ref 135–145)

## 2023-12-19 LAB — GLUCOSE, CAPILLARY
Glucose-Capillary: 105 mg/dL — ABNORMAL HIGH (ref 70–99)
Glucose-Capillary: 165 mg/dL — ABNORMAL HIGH (ref 70–99)
Glucose-Capillary: 199 mg/dL — ABNORMAL HIGH (ref 70–99)
Glucose-Capillary: 209 mg/dL — ABNORMAL HIGH (ref 70–99)
Glucose-Capillary: 221 mg/dL — ABNORMAL HIGH (ref 70–99)
Glucose-Capillary: 253 mg/dL — ABNORMAL HIGH (ref 70–99)
Glucose-Capillary: 272 mg/dL — ABNORMAL HIGH (ref 70–99)
Glucose-Capillary: 42 mg/dL — CL (ref 70–99)
Glucose-Capillary: 58 mg/dL — ABNORMAL LOW (ref 70–99)

## 2023-12-19 LAB — CBC
HCT: 30.7 % — ABNORMAL LOW (ref 36.0–46.0)
Hemoglobin: 9.3 g/dL — ABNORMAL LOW (ref 12.0–15.0)
MCH: 26.5 pg (ref 26.0–34.0)
MCHC: 30.3 g/dL (ref 30.0–36.0)
MCV: 87.5 fL (ref 80.0–100.0)
Platelets: 316 10*3/uL (ref 150–400)
RBC: 3.51 MIL/uL — ABNORMAL LOW (ref 3.87–5.11)
RDW: 16.4 % — ABNORMAL HIGH (ref 11.5–15.5)
WBC: 17.3 10*3/uL — ABNORMAL HIGH (ref 4.0–10.5)
nRBC: 0 % (ref 0.0–0.2)

## 2023-12-19 MED ORDER — INSULIN ASPART 100 UNIT/ML IJ SOLN
4.0000 [IU] | Freq: Three times a day (TID) | INTRAMUSCULAR | Status: DC
  Administered 2023-12-19 (×2): 4 [IU] via SUBCUTANEOUS

## 2023-12-19 MED ORDER — TICAGRELOR 90 MG PO TABS
90.0000 mg | ORAL_TABLET | Freq: Two times a day (BID) | ORAL | Status: DC
  Administered 2023-12-19 – 2023-12-27 (×17): 90 mg via ORAL
  Filled 2023-12-19 (×18): qty 1

## 2023-12-19 MED ORDER — INSULIN GLARGINE-YFGN 100 UNIT/ML ~~LOC~~ SOLN: 14.0000 [IU] | Freq: Every day | SUBCUTANEOUS | Status: DC

## 2023-12-19 MED ORDER — INSULIN GLARGINE-YFGN 100 UNIT/ML ~~LOC~~ SOLN
14.0000 [IU] | Freq: Every day | SUBCUTANEOUS | Status: DC
  Administered 2023-12-19: 14 [IU] via SUBCUTANEOUS
  Filled 2023-12-19 (×3): qty 0.14

## 2023-12-19 MED ORDER — DAPTOMYCIN-SODIUM CHLORIDE 700-0.9 MG/100ML-% IV SOLN
700.0000 mg | Freq: Every day | INTRAVENOUS | Status: DC
  Filled 2023-12-19: qty 100

## 2023-12-19 NOTE — Progress Notes (Signed)
 Patient's blood sugar is 42. Patient is alert and oriented x4 and asymptomatic. Patient drank 240 mL of orange juice. Will recheck in 15 mins. Rathore MD notified

## 2023-12-19 NOTE — Progress Notes (Signed)
 Patient ID: Dana [name], female   DOB: [birth year], 64 y.o.   MRN: [id number] Patient is a 63 year old woman who is postop day 1 left below-knee amputation.  There is 75 cc in the wound VAC canister there is a good suction fit.  Anticipate patient can discharge with the Prevena plus pump.  Okay to resume anticoagulation therapy.  Peel and  place wound VAC dressing in place.

## 2023-12-19 NOTE — Evaluation (Signed)
 Physical Therapy Re-Evaluation Patient Details Name: Dana [name] MRN: [id number] DOB: 03/03/1961 Today's Date: 12/19/2023  History of Present Illness  63 y.o. female admitted  5/31 with Severe sepsis, present on admission  MRSA bacteremia,  Left ankle post op infection,  Bilateral pyelonephritis. 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 PMH insulin -dependent diabetes mellitus, CKD stage IV, CAD, chronic hypoxic respiratory failure, OSA on CPAP, chronic HFmrEF, depression, anxiety, chronic pain, and left trimalleolar ankle fracture/dislocation status postoperative repair on 11/13/2023   Clinical Impression  Re-evaluated post op day #1 after confirmation with Dr. Julio Ohm for patient to be seen today by PT and OT. She was able to stand from edge of bed with Mod assist +2 using RW for support. Unable to pivot due to weakness this attempt. Min assist +2 for sliding board transfer training showing adequate UE support. Reviewed BKA precautions and initiated a few exercises which were well tolerated. Instructions for desensitization techniques. Patient will continue to benefit from skilled physical therapy services to further improve independence with functional mobility. Patient will benefit from intensive inpatient follow-up therapy, >3 hours/day.       If plan is discharge home, recommend the following: Two people to help with walking and/or transfers;A lot of help with bathing/dressing/bathroom;Assistance with cooking/housework;Assist for transportation   Can travel by private vehicle        Equipment Recommendations Other (comment) (TBD, possible lightweight wheelchair and sliding board.)  Recommendations for Other Services  Rehab consult    Functional Status Assessment Patient has had a recent decline in their functional status and demonstrates the ability to make significant improvements in function in a reasonable and predictable amount of time.     Precautions / Restrictions Precautions Precautions: Fall Recall  of Precautions/Restrictions: Impaired Precaution/Restrictions Comments: LLE would vac, L limb protector Restrictions Weight Bearing Restrictions Per Provider Order: Yes LLE Weight Bearing Per Provider Order: Non weight bearing      Mobility  Bed Mobility Overal bed mobility: Needs Assistance Bed Mobility: Supine to Sit     Supine to sit: Mod assist, HOB elevated, Used rails     General bed mobility comments: Mod assist for scoot with light support for trunk to rise. Good LLE control with ampushield in place.    Transfers Overall transfer level: Needs assistance Equipment used: Rolling walker (2 wheels), Sliding board Transfers: Sit to/from Stand, Bed to chair/wheelchair/BSC Sit to Stand: +2 physical assistance, +2 safety/equipment, Mod assist          Lateral/Scoot Transfers: +2 safety/equipment, With slide board, Min assist, +2 physical assistance General transfer comment: Mod A + 2 to stand from bedside with RW, cues for hand placement, assist to gain balance and cues to push down on RW rather than trying to lift it. Unable to pivot on RLE. Opted for sliding board to drop arm recliner w/ Min A x 2 for safety, use of bed pad to assist with final scoot into chair and cues for hand placement    Ambulation/Gait                  Stairs            Wheelchair Mobility     Tilt Bed    Modified Rankin (Stroke Patients Only)       Balance Overall balance assessment: Needs assistance Sitting-balance support: No upper extremity supported, Feet supported Sitting balance-Leahy Scale: Good     Standing balance support: Bilateral upper extremity supported, Reliant on assistive device for balance Standing balance-Leahy Scale:  Poor                               Pertinent Vitals/Pain Pain Assessment Pain Assessment: 0-10 Faces Pain Scale: Hurts little more Pain Location: LLE (phantom pain) Pain Descriptors / Indicators: Sore Pain  Intervention(s): Monitored during session, Repositioned, Limited activity within patient's tolerance, Relaxation    Home Living Family/patient expects to be discharged to:: Private residence Living Arrangements: Spouse/significant other;Parent Available Help at Discharge: Family;Available 24 hours/day Type of Home: House Home Access: Ramped entrance       Home Layout: Able to live on main level with bedroom/bathroom Home Equipment: Rolling Walker (2 wheels);Shower seat;Grab bars - tub/shower;Hand held shower head;Cane - single point;Wheelchair - Counsellor (4 wheels)      Prior Function Prior Level of Function : Needs assist;History of Falls (last six months)             Mobility Comments: Transfers to [facility], onto knee scooter. Not using W/c in house ADLs Comments: has been using BSC since ankle injury, needing assist for LB dressing since ankle injury but typically manages ADls and light IADLs     Extremity/Trunk Assessment   Upper Extremity Assessment Upper Extremity Assessment: Defer to OT evaluation    Lower Extremity Assessment Lower Extremity Assessment: LLE deficits/detail;Generalized weakness LLE Deficits / Details: Lt BKA, vac in place, shrinker and ampushield on LLE: Unable to fully assess due to pain    Cervical / Trunk Assessment Cervical / Trunk Assessment: Normal  Communication   Communication Communication: No apparent difficulties    Cognition Arousal: Alert Behavior During Therapy: WFL for tasks assessed/performed   PT - Cognitive impairments: No apparent impairments                         Following commands: Intact       Cueing Cueing Techniques: Verbal cues, Tactile cues     General Comments General comments (skin integrity, edema, etc.): VSS    Exercises Amputee Exercises Quad Sets: Strengthening, Both, 10 reps, Seated Gluteal Sets: Strengthening, Both, 5 reps, Seated Knee Flexion: AROM, Left, 5 reps, Seated    Assessment/Plan    PT Assessment Patient needs continued PT services  PT Problem List Decreased strength;Decreased range of motion;Decreased activity tolerance;Decreased balance;Decreased mobility;Decreased knowledge of use of DME;Decreased knowledge of precautions;Cardiopulmonary status limiting activity;Impaired sensation;Obesity;Pain       PT Treatment Interventions DME instruction;Gait training;Stair training;Functional mobility training;Therapeutic activities;Therapeutic exercise;Balance training;Neuromuscular re-education;Patient/family education;Modalities    PT Goals (Current goals can be found in the Care Plan section)  Acute Rehab PT Goals Patient Stated Goal: Get well, walk again PT Goal Formulation: With patient Time For Goal Achievement: 01/02/24 Potential to Achieve Goals: Good    Frequency Min 2X/week     Co-evaluation PT/OT/SLP Co-Evaluation/Treatment: Yes Reason for Co-Treatment: For patient/therapist safety;To address functional/ADL transfers PT goals addressed during session: Mobility/safety with mobility;Proper use of DME;Balance;Strengthening/ROM OT goals addressed during session: ADL's and self-care;Proper use of Adaptive equipment and DME       AM-PAC PT "6 Clicks" Mobility  Outcome Measure Help needed turning from your back to your side while in a flat bed without using bedrails?: A Little Help needed moving from lying on your back to sitting on the side of a flat bed without using bedrails?: A Little Help needed moving to and from a bed to a chair (including a wheelchair)?: A Lot Help needed standing  up from a chair using your arms (e.g., wheelchair or bedside chair)?: A Lot Help needed to walk in hospital room?: Total Help needed climbing 3-5 steps with a railing? : Total 6 Click Score: 12    End of Session Equipment Utilized During Treatment: Gait belt;Oxygen  Activity Tolerance: Patient tolerated treatment well (Weakness) Patient left: with call  bell/phone within reach;in chair;with chair alarm set Nurse Communication: Mobility status;Need for lift equipment (Sliding board) PT Visit Diagnosis: Unsteadiness on feet (R26.81);Muscle weakness (generalized) (M62.81);Difficulty in walking, not elsewhere classified (R26.2);Pain Pain - Right/Left: Left Pain - part of body: Leg    Time: 1027-1059 PT Time Calculation (min) (ACUTE ONLY): 32 min   Charges:   PT Evaluation $PT Re-evaluation: 1 Re-eval   PT General Charges $$ ACUTE PT VISIT: 1 Visit         Jory Ng, PT, DPT [facility]  Rehabilitation Services Physical Therapist Office: [phone] Website: [email or web address]   Alinda Irani 12/19/2023, 2:49 PM

## 2023-12-19 NOTE — Consult Note (Signed)
 Consultation Note Date: 12/19/2023   Patient Name: Dana [name]  DOB: 07/11/1961  MRN: [id number]  Age / Sex: 63 y.o., female  PCP: Mercy Stall, MD Referring Physician: Burton Casey, MD  Reason for Consultation: Establishing goals of care  HPI/Patient Profile: 63 y.o. female  with past medical history of DM-2, CKD 4, CAD, HFmrEF, OSA on CPAP-is s/p ORIF left ankle fracture/dislocation on 4/30. admitted on 12/14/2023 with transfer from [facility] for left ankle infection and MRSA bacteremia. Now 1 day s/p left BKA.   Today, patient states she is feeling much better than she was on admission to the hospital.  She rates her pain at a 5 out of 10 and states that this is the best her pain has been since all of this started.  She shares that both the oxycodone  and Dilaudid  are effective at managing her pain.  She does have chronic anxiety but states that it is improved now that she has undergone the surgery and feels much better.  PMT has been consulted to assist with goals of care conversation.  Clinical Assessment and Goals of Care:  I have reviewed medical records including EPIC notes, labs and imaging, prior hospital encounters, assessed the patient and then with permission initiated discussion about diagnosis, prognosis, GOC, EOL wishes, disposition and options.  I introduced Palliative Medicine as specialized medical care for people living with serious illness. It focuses on providing relief from the symptoms and stress of a serious illness. The goal is to improve quality of life for both the patient and the family.  We discussed a brief life review of the patient and then focused on their current illness.    I attempted to elicit values and goals of care important to the patient.    Medical History Review and Family/Patient Understanding:   Patient shares that she has had some decline in her health including her mobility over the past few years  especially since the ankle fx.  She seems to have a good understanding medical condition.  She feels that her health was very tenuous on arrival to the hospital but feels that she has made significant improvements now that she has had surgery and been treated with IV antibiotics.  We discussed her acute on chronic illness especially in the context of her other medical problems.  While I agree her health is stabilized a lot especially since undergoing amputation and while we hope that her health will continue to improve now that that concerns been addressed, there is a possibility of a (and potentially rapid) worsening  in her medical condition.  Discussed lab work including rising white count and creatinine. She understands this but is hopeful to improve and given her recent surgical intervention this is certainly a good possibility.   Social History:  Patient lives at home with family.  Prior to this hospitalization she was having to use a knee scooter for mobility given the left ankle injury as well as have been having some light assistance with ADL.  She states that prior to the fracture, she was independently mobile other than being unable to walk long distances due to low back pain.  Since becoming ill and having surgical intervention, she has had a significant decline in her function.  Plan is to transfer to CIR for intensive rehab with the hopes of returning home.  Functional and Nutritional State:  Prior: Knee scooter for mobility since left ankle injury, now with significant decline.  Prior to this hospitalization,  she admits that she had a poor appetite.  She is uncertain if she has lost any weight but just has not felt like eating.  We discussed the importance of eating high-protein foods to assist with wound healing and a diabetic diet to help prevent hyperglycemia which would prohibit healing.  She verbalizes understanding and plans to try to improve her diet and appetite.  Palliative  Symptoms:  Pain: 5 out of 10 but improving especially status post surgery.  She shares that the IV Dilaudid  helps more but wears off more quickly and the oxycodone  does help but does not last as long.  She feels current regimen is managing her pain reasonably well and overall, she feels her pain is much better now that she has undergoing surgery.  We discussed that during this initial postoperative phase is expected to have some moderate pain but I would expect these to improve over the next 24 to 48 hours and she is hopeful to go back to her baseline home Norco once she has recovered from amputation.  Anxiety: Improved post surgery. Followed by psych.   Advance Directives:  A detailed discussion regarding advanced directives was had.  See below for details.  Patient elects to remain full code. Given her desire to avoid being dependent it would be reasonable to consider DNR/DNI as the likelihood of return to previous baseline after cardiac arrest attempt would be unlikely.  However, patient has personal experience with life support.  She shares that she had an anoxic brain injury and she actually had a DNR on file but it was not sent with her to the hospital and therefore she was coded and placed on a ventilator.  She states that she was able to recover and return to her baseline.  She feels that if they had found her DNR she would not still be here.  She also states her husband is very adamant against her being a DNR.  She wishes to remain full code.    Code Status:  Full code  Discussion:  Spoke at length with patient regarding goals, preferences, and what quality of life means.  For her quality of life means being able to go home to her family and pets (a parrot, 4 cats).  Her goal right now is to improve enough to get home and be more independent.  Quality of life means being able to care for herself and be there for her family.  She states that a quality of life she would find unacceptable  would be being totally dependent on others and unable to toilet or feed herself. Offered DNR/DNI but this was declined.    Discussed the importance of continued conversation with family and the medical providers regarding overall plan of care and treatment options, ensuring decisions are within the context of the patient's values and GOCs. She shares does have a HCPOA and has discussed her wishes at end-of-life with her HCOPA after her anoxic brain injury.  She states her HCPOA is aware that she would not want to be dependent on life support rest of her life if recovery were not possible but that she is ok with trial of life support.  Questions and concerns were addressed.  She is aware to call for any palliative questions or concerns and is grateful for the discussion.    HCPOA: daughter Marlis Simper), she states that she has HCPOA documentation as well as a living will and that her family knows her wishes and what to do with  her body after she has passed (she wishes for her body to be donated, then cremated, then her ashes spread in [place])   Separate time spent discussing ACP: 35 mins  SUMMARY OF RECOMMENDATIONS   Continue full code full scope of treatment. Agree with transition to CIR for intensive rehab to help her achieve her goal of returning home with family and being independent as possible for ADLs Continue current pain regimen and de-escalate as able (unable to de-escalate currently due to ongoing postop pain with recent surgical intervention) Continue current psych regimen and follow-up with her prior therapist as outpatient Symptom management appears well maintained at present.  Continue symptom management per primary team. May consider transition to Percocet or longer acting opiate as able to wean down from IV opiates for longer duration of action Her goals are quite clear.  Therefore, PMT will shadow for decline.  Feel free to reach out to PMT in the interim for urgent needs.  Code  Status/Advance Care Planning: Full code   Symptom Management:  Tylenol  PRN Oxycodone  IR 5 mg q 4 hrs PRN Dilaudid  0.5 mg q 4 hrs PRN Ativan  0.5 mg q 4 hrs PRN Lexapro  10 mg/day Senna 2 tabs nightly Miralax  17 G/day Compazine  5 mg PRN nausea   Palliative Prophylaxis:  Bowel Regimen  Additional Recommendations (Limitations, Scope, Preferences): Full Scope Treatment  Psycho-social/Spiritual:  Desire for further Chaplaincy support:no  Prognosis:  > 12 months  Discharge Planning: CIR for skilled therapy      Primary Diagnoses: Present on Admission:  Surgical site infection  Coronary artery disease involving native coronary artery of native heart with angina pectoris (HCC)  CKD stage 4 due to type 1 diabetes mellitus (HCC)  Chronic respiratory failure with hypoxia (HCC)  Chronic combined systolic (congestive) and diastolic (congestive) heart failure (HCC)  Uncontrolled type 1 diabetes mellitus with hyperglycemia, with long-term current use of insulin  (HCC)  MRSA bacteremia  Depression with anxiety  UTI (urinary tract infection)  Generalized anxiety disorder    Physical Exam Constitutional:      General: She is not in acute distress.    Appearance: Normal appearance. She is not toxic-appearing.  Pulmonary:     Effort: Pulmonary effort is normal. No respiratory distress.  Musculoskeletal:     Comments: LLE BKA  Skin:    General: Skin is warm and dry.  Neurological:     Mental Status: She is alert.  Psychiatric:        Mood and Affect: Mood normal.        Behavior: Behavior normal.        Thought Content: Thought content normal.     Vital Signs: BP (!) 111/53 (BP Location: Right Arm)   Pulse 60   Temp 97.7 F (36.5 C) (Oral)   Resp 16   Ht 5\' 5"  (1.651 m)   Wt 98 kg Comment: at [facility]  SpO2 95%   BMI 35.95 kg/m  Pain Scale: 0-10 POSS *See Group Information*: 1-Acceptable,Awake and alert Pain Score: 6    SpO2: SpO2: 95 % O2 Device:SpO2:  95 % O2 Flow Rate: .O2 Flow Rate (L/min): 2 L/min   Palliative Assessment/Data:60%     Billing based on MDM: High  Problems Addressed: One or more chronic illnesses with severe exacerbation, progression, or side effects of treatment.  Independently interpreted and shared lab findings. Reviewed specialist notes and prior hospital encounters.    Risks: Parenteral controlled substances and Decision regarding hospitalization or escalation of hospital care  Render Carrie, NP  Palliative Medicine Team Team phone # [phone]  Thank you for allowing the Palliative Medicine Team to assist in the care of this patient. Please utilize secure chat with additional questions, if there is no response within 30 minutes please call the above phone number.  Palliative Medicine Team providers are available by phone from 7am to 7pm daily and can be reached through the team cell phone.  Should this patient require assistance outside of these hours, please call the patient's attending physician.

## 2023-12-19 NOTE — Progress Notes (Signed)
 Physical Therapy Treatment Patient Details Name: Dana [name] MRN: [id number] DOB: May 21, 1961 Today's Date: 12/19/2023   History of Present Illness 63 y.o. female admitted  5/31 with Severe sepsis, present on admission  MRSA bacteremia,  Left ankle post op infection,  Bilateral pyelonephritis. 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 PMH insulin -dependent diabetes mellitus, CKD stage IV, CAD, chronic hypoxic respiratory failure, OSA on CPAP, chronic HFmrEF, depression, anxiety, chronic pain, and left trimalleolar ankle fracture/dislocation status postoperative repair on 11/13/2023    PT Comments  Pt had transferred floors in recliner due to leak in her previous room. Pt request to have BM and current RN unfamiliar with pt ability for transfers, asking for PT assistance. No drop arm BSC available in time, so pt agreeable to scoot transfer back to bed to use bed pan. Pt very motivated to help and is modAx2 for pad scoot from recliner back to bed. CIR admissions coordinator in room at end of session. D/c recommendation remains appropriate.     If plan is discharge home, recommend the following: Two people to help with walking and/or transfers;A lot of help with bathing/dressing/bathroom;Assistance with cooking/housework;Assist for transportation   Can travel by private vehicle        Equipment Recommendations  Other (comment) (TBD, possible lightweight wheelchair and sliding board.)    Recommendations for Other Services Rehab consult     Precautions / Restrictions Precautions Precautions: Fall Recall of Precautions/Restrictions: Impaired Precaution/Restrictions Comments: LLE would vac, L limb protector Restrictions Weight Bearing Restrictions Per Provider Order: Yes LLE Weight Bearing Per Provider Order: Non weight bearing     Mobility  Bed Mobility Overal bed mobility: Needs Assistance Bed Mobility: Supine to Sit       Sit to supine: Contact guard assist   General bed mobility comments: pt able to  return to supine and with bed in [place] pt is able to pull herself up in the bed    Transfers Overall transfer level: Needs assistance   Transfers: Bed to chair/wheelchair/BSC            Lateral/Scoot Transfers: +2 safety/equipment, With slide board, +2 physical assistance, Mod assist General transfer comment: with modAx2 and use of bed pad pt is able to scoot to her L where the drop arm of the recliner is, from chair to bed with modAx2 and use of bed pad to assist in elevating her hips, cues for hand placement throughout       Balance Overall balance assessment: Needs assistance Sitting-balance support: No upper extremity supported, Feet supported Sitting balance-Leahy Scale: Good                                      Communication Communication Communication: No apparent difficulties  Cognition Arousal: Alert Behavior During Therapy: WFL for tasks assessed/performed   PT - Cognitive impairments: No apparent impairments                         Following commands: Intact      Cueing Cueing Techniques: Verbal cues, Tactile cues     General Comments General comments (skin integrity, edema, etc.): VSS, pt transferred floors in recliner due to a plumbing problem and needed assist to transfer from chair to bed      Pertinent Vitals/Pain Pain Assessment Pain Assessment: 0-10 Faces Pain Scale: Hurts little more Pain Location: bottom from sitting up in recliner all day  Pain Descriptors / Indicators: Sore Pain Intervention(s): Limited activity within patient's tolerance, Monitored during session, Repositioned    Home Living Family/patient expects to be discharged to:: Private residence Living Arrangements: Spouse/significant other;Parent Available Help at Discharge: Family;Available 24 hours/day Type of Home: House Home Access: Ramped entrance       Home Layout: Able to live on main level with bedroom/bathroom Home Equipment: Rolling  Walker (2 wheels);Shower seat;Grab bars - tub/shower;Hand held shower head;Cane - single point;Wheelchair - manual;BSC/3in1;Rollator (4 wheels)          PT Goals (current goals can now be found in the care plan section) Acute Rehab PT Goals Patient Stated Goal: Get well, walk again PT Goal Formulation: With patient Time For Goal Achievement: 01/02/24 Potential to Achieve Goals: Good Progress towards PT goals: Progressing toward goals    Frequency    Min 2X/week       Co-evaluation PT/OT/SLP Co-Evaluation/Treatment: Yes Reason for Co-Treatment: For patient/therapist safety;To address functional/ADL transfers PT goals addressed during session: Mobility/safety with mobility;Proper use of DME;Balance;Strengthening/ROM        AM-PAC PT "6 Clicks" Mobility   Outcome Measure  Help needed turning from your back to your side while in a flat bed without using bedrails?: A Little Help needed moving from lying on your back to sitting on the side of a flat bed without using bedrails?: A Little Help needed moving to and from a bed to a chair (including a wheelchair)?: A Lot Help needed standing up from a chair using your arms (e.g., wheelchair or bedside chair)?: A Lot Help needed to walk in hospital room?: Total Help needed climbing 3-5 steps with a railing? : Total 6 Click Score: 12    End of Session Equipment Utilized During Treatment: Gait belt Activity Tolerance: Patient tolerated treatment well (Weakness) Patient left: with call bell/phone within reach;in chair;with chair alarm set;Other (comment) (AIR admissions coordinator present during session) Nurse Communication: Mobility status;Need for lift equipment PT Visit Diagnosis: Unsteadiness on feet (R26.81);Muscle weakness (generalized) (M62.81);Difficulty in walking, not elsewhere classified (R26.2);Pain Pain - Right/Left: Left Pain - part of body: Leg     Time: 1520-1535 PT Time Calculation (min) (ACUTE ONLY): 15  min  Charges:    $Therapeutic Activity: 8-22 mins PT General Charges $$ ACUTE PT VISIT: 1 Visit                     Euleta Belson B. Jewel Mortimer PT, DPT Acute Rehabilitation Services Please use secure chat or  Call Office [phone]    [name] [facility] 12/19/2023, 5:04 PM

## 2023-12-19 NOTE — Evaluation (Addendum)
 Occupational Therapy Evaluation Patient Details Name: Dana [name] MRN: [id number] DOB: March 13, 1961 Today's Date: 12/19/2023   History of Present Illness   63 y.o. female admitted  5/31 with Severe sepsis, present on admission  MRSA bacteremia,  Left ankle post op infection,  Bilateral pyelonephritis. L BKA 6/4. 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 Since injury, pt has been using knee scooter for transfers and has light assist for ADL aspects. Pt presents now with deficits in endurance, standing balance, strength and pain. Pt requires Mod A x 2 to stand from bedside with RW but unable to advance transfers w/ RW today. Pt able to manage sliding board transfer with less assistance though +2 still provided for safety. Pt requires Setup for UB ADL and Max A x 2 for LB ADLs completed in standing. Based on PLOF and pt motivation, feel pt would make good progress with intensive rehab services prior to DC home.      If plan is discharge home, recommend the following:   A lot of help with walking and/or transfers;Two people to help with walking and/or transfers;A lot of help with bathing/dressing/bathroom;Two people to help with bathing/dressing/bathroom     Functional Status Assessment   Patient has had a recent decline in their functional status and demonstrates the ability to make significant improvements in function in a reasonable and predictable amount of time.     Equipment Recommendations   Wheelchair (measurements OT);Wheelchair cushion (measurements OT);Other (comment) (drop arm BSC)     Recommendations for Other  Services   Rehab consult     Precautions/Restrictions   Precautions Precautions: Fall Precaution/Restrictions Comments: LLE would vac, L limb protector Restrictions Weight Bearing Restrictions Per Provider Order: Yes LLE Weight Bearing Per Provider Order: Non weight bearing     Mobility Bed Mobility Overal bed mobility: Needs Assistance Bed Mobility: Supine to Sit     Supine to sit: Mod assist, HOB elevated, Used rails     General bed mobility comments: assist with bed pad to scoot hips to EOB, use of bedrail and light assist to lift trunk    Transfers Overall transfer level: Needs assistance Equipment used: Rolling walker (2 wheels), Sliding board Transfers: Sit to/from Stand, Bed to chair/wheelchair/BSC Sit to Stand: +2 physical assistance, +2 safety/equipment, Mod assist          Lateral/Scoot Transfers: +2 safety/equipment, With slide board, Min assist, +2 physical assistance General transfer comment: Mod A x 2 to stand from bedside with RW, cues for hand placement, assist to gain balance and cues to push down on RW rather than trying to lift it. Opted for sliding board to drop arm recliner w/ Min A x 2 for safety, use of bed pad to assist with final scoot into chair and cues for hand placement      Balance Overall balance assessment: Needs assistance Sitting-balance support: No upper extremity supported, Feet supported Sitting balance-Leahy Scale: Good     Standing balance support: Bilateral upper extremity supported, Reliant on assistive device for balance Standing balance-Leahy Scale: Poor  ADL either performed or assessed with clinical judgement   ADL Overall ADL's : Needs assistance/impaired Eating/Feeding: Independent   Grooming: Set up;Sitting   Upper Body Bathing: Set up;Sitting   Lower Body Bathing: Maximal assistance;+2 for safety/equipment;+2 for physical assistance;Sit to/from stand   Upper Body  Dressing : Set up;Sitting   Lower Body Dressing: Maximal assistance;+2 for physical assistance;+2 for safety/equipment;Sit to/from stand       Toileting- Architect and Hygiene: Total assistance;Bed level               Vision Baseline Vision/History: 1 Wears glasses Ability to See in Adequate Light: 0 Adequate Patient Visual Report: No change from baseline Vision Assessment?: No apparent visual deficits     Perception         Praxis         Pertinent Vitals/Pain Pain Assessment Pain Assessment: 0-10 Pain Score: 4  Pain Location: Lt foot Pain Descriptors / Indicators: Sore Pain Intervention(s): Monitored during session, Limited activity within patient's tolerance     Extremity/Trunk Assessment Upper Extremity Assessment Upper Extremity Assessment: Overall WFL for tasks assessed;Right hand dominant   Lower Extremity Assessment Lower Extremity Assessment: Defer to PT evaluation   Cervical / Trunk Assessment Cervical / Trunk Assessment: Normal   Communication Communication Communication: No apparent difficulties   Cognition Arousal: Alert Behavior During Therapy: WFL for tasks assessed/performed Cognition: No apparent impairments                               Following commands: Intact       Cueing  General Comments   Cueing Techniques: Verbal cues;Tactile cues      Exercises     Shoulder Instructions      Home Living Family/patient expects to be discharged to:: Private residence Living Arrangements: Spouse/significant other;Parent Available Help at Discharge: Family;Available 24 hours/day Type of Home: House Home Access: Ramped entrance     Home Layout: Able to live on main level with bedroom/bathroom     Bathroom Shower/Tub: Producer, television/film/video: Standard     Home Equipment: Agricultural consultant (2 wheels);Shower seat;Grab bars - tub/shower;Hand held shower head;Cane - single point;Wheelchair -  manual;BSC/3in1;Rollator (4 wheels)          Prior Functioning/Environment Prior Level of Function : Needs assist;History of Falls (last six months)             Mobility Comments: Transfers to [facility], onto knee scooter. Not using W/c in house ADLs Comments: has been using BSC since ankle injury, needing assist for LB dressing since ankle injury but typically manages ADls and light IADLs    OT Problem List: Decreased strength;Decreased activity tolerance;Impaired balance (sitting and/or standing);Decreased knowledge of use of DME or AE;Pain   OT Treatment/Interventions: Self-care/ADL training;Therapeutic exercise;Energy conservation;DME and/or AE instruction;Therapeutic activities;Patient/family education;Balance training      OT Goals(Current goals can be found in the care plan section)   Acute Rehab OT Goals Patient Stated Goal: work on [facility] transfers OT Goal Formulation: With patient Time For Goal Achievement: 01/02/24 Potential to Achieve Goals: Good ADL Goals Pt Will Perform Lower Body Bathing: sitting/lateral leans;with mod assist;sit to/from stand Pt Will Perform Lower Body Dressing: with mod assist;sitting/lateral leans;sit to/from stand Pt Will Transfer to Toilet: with min assist;with transfer board;anterior/posterior transfer;bedside commode Pt/caregiver will Perform Home Exercise Program: Increased strength;Both right and left upper extremity;With theraband;Independently;With written HEP provided   OT Frequency:  Min  2X/week    Co-evaluation PT/OT/SLP Co-Evaluation/Treatment: Yes Reason for Co-Treatment: For patient/therapist safety;To address functional/ADL transfers PT goals addressed during session: Mobility/safety with mobility;Proper use of DME OT goals addressed during session: ADL's and self-care;Proper use of Adaptive equipment and DME      AM-PAC OT "6 Clicks" Daily Activity     Outcome Measure Help from another person eating meals?: None Help from  another person taking care of personal grooming?: A Little Help from another person toileting, which includes using toliet, bedpan, or urinal?: Total Help from another person bathing (including washing, rinsing, drying)?: A Lot Help from another person to put on and taking off regular upper body clothing?: A Little Help from another person to put on and taking off regular lower body clothing?: Total 6 Click Score: 14   End of Session Equipment Utilized During Treatment: Gait belt;Rolling walker (2 wheels) Nurse Communication: Mobility status  Activity Tolerance: Patient tolerated treatment well Patient left: in chair;with call bell/phone within reach  OT Visit Diagnosis: Unsteadiness on feet (R26.81);Other abnormalities of gait and mobility (R26.89);Muscle weakness (generalized) (M62.81)                Time: 1610-9604 OT Time Calculation (min): 35 min Charges:  OT General Charges $OT Visit: 1 Visit OT Evaluation $OT Eval Moderate Complexity: 1 Mod  Lawrence Pretty, OTR/L Acute Rehab Services Office: [phone]   Shireen Dory 12/19/2023, 11:26 AM

## 2023-12-19 NOTE — Progress Notes (Addendum)
 PROGRESS NOTE        PATIENT DETAILS Name: Dana [name] Age: 63 y.o. Sex: female Date of Birth: 09-15-1960 Admit Date: 12/14/2023 Admitting Physician Walton Guppy, MD PCP:Cox, Burleigh Carp, MD  Following-plans for TEE soon.  Summary: Patient is a 63 y.o.  female with history of DM-2, CKD 4, CAD, HFmrEF, OSA on CPAP-is s/p ORIF left ankle fracture/dislocation on 4/30-presented as a transfer from [facility] health for left ankle infection and MRSA bacteremia.  Significant events: 5/31>> transferred from [facility] health-MRSA bacteremia/left ankle infection 6/04>> left BKA.  Significant studies: 5/31>> CT abdomen/pelvis: No hydronephrosis, nonspecific bilateral perinephric fat stranding, enlarged left ovary with surrounding inflammatory stranding. 5/31>> pelvic ultrasound: S/p hysterectomy. 6/01>> TTE: EF 40-45%, s/p mitral clip-mild to moderate MR. 601>> B/L lower extremity Doppler: No DVT. 6/02>> VQ scan: No PE.  Significant microbiology data: 5/31>> blood culture: No growth  Procedures: 6/4>> left BKA.  Consults: Ortho recs ID  Subjective: Lying comfortably in bed-no major issues.  Objective: Vitals: Blood pressure 135/60, pulse 66, temperature 97.6 F (36.4 C), temperature source Oral, resp. rate 11, height 5\' 5"  (1.651 m), weight 98 kg, SpO2 98%.   Exam: Awake/alert Chest: Clear to auscultation CVS: S1-S2 regular Abdomen: Soft nontender nondistended Left BKA 1+ leg edema RLE Nonfocal exam  Pertinent Labs/Radiology:    Latest Ref Rng & Units 12/19/2023    4:33 AM 12/18/2023    3:51 AM 12/17/2023    4:09 AM  CBC  WBC 4.0 - 10.5 K/uL 17.3  13.4  14.9   Hemoglobin 12.0 - 15.0 g/dL 9.3  9.5  9.4   Hematocrit 36.0 - 46.0 % 30.7  30.4  30.0   Platelets 150 - 400 K/uL 316  280  278     Lab Results  Component Value Date   NA 133 (L) 12/19/2023   K 4.6 12/19/2023   CL 97 (L) 12/19/2023   CO2 25 12/19/2023      Assessment/Plan: Severe  sepsis secondary to secondary to postoperative left ankle infection and MRSA bacteremia with bilateral E. coli pyelonephritis. Clinically improved-still has some leukocytosis but sepsis physiology has resolved. Repeat cultures negative so far Remains on IV vancomycin  for MRSA bacteremia On Ancef  for E. coli pyelonephritis. S/p left BKA 6/4 Cards arranging TEE.  Volume overload secondary to right-sided heart failure and acute on chronic HFrEF Volume status improved with diuretics Cardiology following.  CAD-s/p PCI Continue aspirin  Discussed with Dr. Rudell Corrigan to resume Brilinta  today. Continue Coreg  On Repatha  at home.    History of mitral regurgitation-s/p MitraClip 2022 Mild to moderate MR on echo Await TEE  CKD 4 Close to baseline Trend electrolytes periodically  Normocytic anemia Secondary to combination of CKD/acute illness-no evidence of blood loss. Hb stable-s/p 3 units of PRBC so far-last transfused on 6/1.  DM-2 (A1c 10.16 October 2023) CBGs on the higher side Increase Semglee  to 14 units-add 4 needs of NovoLog -continue SSI Follow/optimize.  Recent Labs    12/19/23 0033 12/19/23 0317 12/19/23 0745  GLUCAP 272* 253* 209*     Depression/anxiety Somewhat anxious this morning for BKA Remains on Lexapro/gabapentin  As needed Ativan  Psych following.  Class 2 Obesity: Estimated body mass index is 35.95 kg/m as calculated from the following:   Height as of this encounter: 5\' 5"  (1.651 m).   Weight as of this encounter: 98 kg.   Code  status:   Code Status: Full Code   DVT Prophylaxis: heparin  injection 5,000 Units Start: 12/14/23 1400 SCDs Start: 12/14/23 0119   Family Communication: None at bedside   Disposition Plan: Status is: Inpatient Remains inpatient appropriate because: Severity of illness   Planned Discharge Destination:Rehabilitation facility   Diet: Diet Order             Diet heart healthy/carb modified Fluid consistency: Thin  Diet  effective now                     Antimicrobial agents: Anti-infectives (From admission, onward)    Start     Dose/Rate Route Frequency Ordered Stop   12/20/23 1000  DAPTOmycin (CUBICIN) IVPB 700 mg/17mL premix        700 mg 200 mL/hr over 30 Minutes Intravenous Daily 12/19/23 1009     12/18/23 0600  vancomycin  (VANCOREADY) IVPB 1250 mg/250 mL  Status:  Discontinued        1,250 mg 166.7 mL/hr over 90 Minutes Intravenous Every 48 hours 12/17/23 1452 12/19/23 1009   12/18/23 0600  ceFAZolin  (ANCEF ) IVPB 2g/100 mL premix  Status:  Discontinued        2 g 200 mL/hr over 30 Minutes Intravenous On call to O.R. 12/17/23 2121 12/18/23 1254   12/16/23 1400  ceFAZolin  (ANCEF ) IVPB 1 g/50 mL premix        1 g 100 mL/hr over 30 Minutes Intravenous Every 8 hours 12/16/23 0937 12/19/23 2359   12/16/23 0600  Vancomycin  (VANCOCIN ) 1,250 mg in sodium chloride  0.9 % 250 mL IVPB  Status:  Discontinued        1,250 mg 166.7 mL/hr over 90 Minutes Intravenous Every 48 hours 12/14/23 1009 12/17/23 1452   12/14/23 1800  piperacillin-tazobactam (ZOSYN) IVPB 3.375 g  Status:  Discontinued        3.375 g 12.5 mL/hr over 240 Minutes Intravenous Every 8 hours 12/14/23 1710 12/16/23 0940   12/14/23 0600  vancomycin  (VANCOCIN ) IVPB 1000 mg/200 mL premix  Status:  Discontinued        1,000 mg 200 mL/hr over 60 Minutes Intravenous Every 24 hours 12/14/23 0210 12/14/23 1009   12/14/23 0215  vancomycin  (VANCOCIN ) IVPB 1000 mg/200 mL premix  Status:  Discontinued        1,000 mg 200 mL/hr over 60 Minutes Intravenous  Once 12/14/23 0122 12/14/23 0130   12/14/23 0215  cefTRIAXone  (ROCEPHIN ) 2 g in sodium chloride  0.9 % 100 mL IVPB  Status:  Discontinued        2 g 200 mL/hr over 30 Minutes Intravenous Daily at bedtime 12/14/23 0155 12/14/23 1703        MEDICATIONS: Scheduled Meds:  allopurinol   50 mg Oral Daily   aspirin  EC  81 mg Oral Daily   carvedilol   6.25 mg Oral BID WC   Chlorhexidine  Gluconate  Cloth  6 each Topical Daily   escitalopram  10 mg Oral Daily   furosemide   60 mg Intravenous BID   gabapentin   300 mg Oral TID   heparin  injection (subcutaneous)  5,000 Units Subcutaneous Q8H   insulin  aspart  0-9 Units Subcutaneous Q4H   insulin  glargine-yfgn  10 Units Subcutaneous Daily   latanoprost   1 drop Both Eyes QHS   morphine   15 mg Oral Q12H   pantoprazole   40 mg Oral Daily   polyethylene glycol  17 g Oral Daily   senna  2 tablet Oral QHS   sodium chloride  flush  3 mL Intravenous Q12H   ticagrelor   90 mg Oral BID   Continuous Infusions:   ceFAZolin  (ANCEF ) IV 1 g (12/19/23 0509)   [START ON 12/20/2023] DAPTOmycin     PRN Meds:.acetaminophen  **OR** acetaminophen , HYDROmorphone  (DILAUDID ) injection, LORazepam , oxyCODONE , prochlorperazine , sodium chloride  flush   I have personally reviewed following labs and imaging studies  LABORATORY DATA: CBC: Recent Labs  Lab 12/15/23 0253 12/16/23 0315 12/17/23 0409 12/18/23 0351 12/19/23 0433  WBC 14.7* 11.0* 14.9* 13.4* 17.3*  HGB 7.5* 9.2* 9.4* 9.5* 9.3*  HCT 23.9* 28.9* 30.0* 30.4* 30.7*  MCV 87.5 85.0 85.7 87.6 87.5  PLT 255 250 278 280 316    Basic Metabolic Panel: Recent Labs  Lab 12/14/23 0214 12/15/23 0253 12/16/23 0315 12/17/23 0409 12/18/23 0351 12/19/23 0433  NA 134* 133* 133* 135 135 133*  K 4.3 4.4 4.3 4.3 4.5 4.6  CL 98 101 102 102 99 97*  CO2 22 21* 21* 24 25 25   GLUCOSE 137* 134* 111* 159* 80 238*  BUN 50* 54* 55* 52* 57* 59*  CREATININE 2.25* 2.28* 2.16* 2.06* 2.05* 2.25*  CALCIUM  8.4* 8.1* 8.1* 8.4* 8.8* 8.5*  MG 2.1  --   --   --   --   --     GFR: Estimated Creatinine Clearance: 30.8 mL/min (A) (by C-G formula based on SCr of 2.25 mg/dL (H)).  Liver Function Tests: Recent Labs  Lab 12/14/23 0214  AST 11*  ALT 11  ALKPHOS 144*  BILITOT 0.6  PROT 6.5  ALBUMIN  2.1*   No results for input(s): "LIPASE", "AMYLASE" in the last 168 hours. No results for input(s): "AMMONIA" in the last  168 hours.  Coagulation Profile: No results for input(s): "INR", "PROTIME" in the last 168 hours.  Cardiac Enzymes: No results for input(s): "CKTOTAL", "CKMB", "CKMBINDEX", "TROPONINI" in the last 168 hours.  BNP (last 3 results) No results for input(s): "PROBNP" in the last 8760 hours.  Lipid Profile: No results for input(s): "CHOL", "HDL", "LDLCALC", "TRIG", "CHOLHDL", "LDLDIRECT" in the last 72 hours.  Thyroid  Function Tests: No results for input(s): "TSH", "T4TOTAL", "FREET4", "T3FREE", "THYROIDAB" in the last 72 hours.  Anemia Panel: No results for input(s): "VITAMINB12", "FOLATE", "FERRITIN", "TIBC", "IRON ", "RETICCTPCT" in the last 72 hours.  Urine analysis:    Component Value Date/Time   COLORURINE YELLOW 12/14/2023 0631   APPEARANCEUR CLOUDY (A) 12/14/2023 0631   LABSPEC 1.015 12/14/2023 0631   PHURINE 5.0 12/14/2023 0631   GLUCOSEU NEGATIVE 12/14/2023 0631   HGBUR SMALL (A) 12/14/2023 0631   BILIRUBINUR NEGATIVE 12/14/2023 0631   BILIRUBINUR negative 10/22/2023 0916   BILIRUBINUR negative 07/11/2021 0812   KETONESUR NEGATIVE 12/14/2023 0631   PROTEINUR 30 (A) 12/14/2023 0631   UROBILINOGEN 0.2 10/22/2023 0916   NITRITE NEGATIVE 12/14/2023 0631   LEUKOCYTESUR MODERATE (A) 12/14/2023 0631    Sepsis Labs: Lactic Acid, Venous    Component Value Date/Time   LATICACIDVEN 1.2 12/14/2023 0214    MICROBIOLOGY: Recent Results (from the past 240 hours)  Urine Culture (for pregnant, neutropenic or urologic patients or patients with an indwelling urinary catheter)     Status: Abnormal   Collection Time: 12/14/23  1:56 AM   Specimen: Urine, Clean Catch  Result Value Ref Range Status   Specimen Description URINE, CLEAN CATCH  Final   Special Requests NONE  Final   Culture (A)  Final    <10,000 COLONIES/mL INSIGNIFICANT GROWTH Performed at [facility], [address], [place], [place] [address]  Report Status 12/15/2023 FINAL  Final  Culture, blood (x 2)      Status: None (Preliminary result)   Collection Time: 12/14/23  2:14 AM   Specimen: BLOOD RIGHT ARM  Result Value Ref Range Status   Specimen Description BLOOD RIGHT ARM  Final   Special Requests   Final    BOTTLES DRAWN AEROBIC AND ANAEROBIC Blood Culture adequate volume   Culture   Final    NO GROWTH 4 DAYS Performed at [facility], [address], [place], [place] [address]    Report Status PENDING  Incomplete  Culture, blood (x 2)     Status: None (Preliminary result)   Collection Time: 12/14/23  2:14 AM   Specimen: BLOOD RIGHT HAND  Result Value Ref Range Status   Specimen Description BLOOD RIGHT HAND  Final   Special Requests   Final    BOTTLES DRAWN AEROBIC AND ANAEROBIC Blood Culture adequate volume   Culture   Final    NO GROWTH 4 DAYS Performed at [facility], [address], [place], [place] [address]    Report Status PENDING  Incomplete  Surgical pcr screen     Status: None   Collection Time: 12/18/23  9:15 AM   Specimen: Nasal Mucosa; Nasal Swab  Result Value Ref Range Status   MRSA, PCR NEGATIVE NEGATIVE Final   Staphylococcus aureus NEGATIVE NEGATIVE Final    Comment: (NOTE) The Xpert SA Assay (FDA approved for NASAL specimens in patients 5 years of age and older), is one component of a comprehensive surveillance program. It is not intended to diagnose infection nor to guide or monitor treatment. Performed at [facility], [address] [place], [place] [address]     RADIOLOGY STUDIES/RESULTS: No results found.   LOS: 5 days   Kimberly Penna, MD  [facility]    To contact the attending provider between 7A-7P or the covering provider during after hours 7P-7A, please log into the web site [email or web address] and access using universal [place] password for that web site. If you do not have the password, please call the hospital operator.  12/19/2023, 10:40 AM

## 2023-12-19 NOTE — Progress Notes (Signed)
 Rounding Note    Patient Name: Dana [name] Date of Encounter: 12/19/2023   [facility] Cardiologist: Jules Oar, MD   Subjective   Pain well controlled post op. Cannot lie flat in bed without shortness of breath.  Inpatient Medications    Scheduled Meds:  allopurinol   50 mg Oral Daily   aspirin  EC  81 mg Oral Daily   carvedilol   6.25 mg Oral BID WC   Chlorhexidine  Gluconate Cloth  6 each Topical Daily   escitalopram  10 mg Oral Daily   furosemide   60 mg Intravenous BID   gabapentin   300 mg Oral TID   heparin  injection (subcutaneous)  5,000 Units Subcutaneous Q8H   insulin  aspart  0-9 Units Subcutaneous Q4H   insulin  aspart  4 Units Subcutaneous TID WC   insulin  glargine-yfgn  14 Units Subcutaneous Daily   latanoprost   1 drop Both Eyes QHS   pantoprazole   40 mg Oral Daily   polyethylene glycol  17 g Oral Daily   senna  2 tablet Oral QHS   sodium chloride  flush  3 mL Intravenous Q12H   ticagrelor   90 mg Oral BID   Continuous Infusions:   ceFAZolin  (ANCEF ) IV 1 g (12/19/23 1328)   [START ON 12/20/2023] DAPTOmycin     PRN Meds: acetaminophen  **OR** acetaminophen , HYDROmorphone  (DILAUDID ) injection, LORazepam , oxyCODONE , prochlorperazine , sodium chloride  flush   Vital Signs    Vitals:   12/19/23 0410 12/19/23 0745 12/19/23 1404 12/19/23 1510  BP:  135/60 (!) 111/53 (!) 125/56  Pulse: 68 66 60 63  Resp: 12 11 16 16   Temp:  97.6 F (36.4 C) 97.7 F (36.5 C) 97.9 F (36.6 C)  TempSrc:  Oral Oral Oral  SpO2: 98% 98% 95% 97%  Weight:      Height:        Intake/Output Summary (Last 24 hours) at 12/19/2023 1617 Last data filed at 12/19/2023 0951 Gross per 24 hour  Intake 843 ml  Output 1670 ml  Net -827 ml      12/14/2023    2:00 AM 12/14/2023    1:00 AM 11/12/2023    4:16 AM  Last 3 Weights  Weight (lbs) 216 lb 0.8 oz 227 lb 11.8 oz 214 lb 0.4 oz  Weight (kg) 98 kg 103.3 kg 97.08 kg      Telemetry    SR - Personally Reviewed  Physical  Exam   GEN: No acute distress.   Neck: JVD low neck at 45 degrees Cardiac: RRR, no rubs, or gallops. 1-2/6 systolic murmur Respiratory: Clear in upper fields, slightly diminished but improving at bilateral bases GI: Soft, nontender, non-distended  MS: s/p L BKA. RLE with 1-2+ pitting edema but improving, wrinkling noted Neuro:  Nonfocal  Psych: Normal affect   New pertinent results (labs, ECG, imaging, cardiac studies)     Assessment & Plan    Volume overload Acute on chronic systolic and diastolic heart failure RV failure Chronic kidney disease, stage 4 -no daily weights. Charted net negative 5.8 L though intake not well visualized -JVD, LE improving but still present -cannot yet lie flat -Cr up slightly today to 2.25 -pending TEE once euvolemic for MRSA bacteremia. Making progress but not ready for tomorrow. Will tentatively plan for Monday 6/9 -continue IV furosemide  BID   Summary of clinical conditions:  MRSA bacteremia, Sepsis, Bilateral pyelonephritis, post surgical wound infection: management per primary team, ID, and surgery. TEE when euvolemic.   CAD with multiple prior PCI: scoring balloon  07/2023 was last intervention, holding brilinta  perioperatively, continue aspirin , restart brilinta  when able post op   History of MR s/p mitraclip 2022: mild-moderate MR on echo   Insulin  dependent diabetes, poorly controlled: A1c 10.2%, management per primary team    Signed, Sheryle Donning, MD  12/19/2023, 4:17 PM

## 2023-12-19 NOTE — Progress Notes (Signed)
 Inpatient Rehab Coordinator Note:  I met with patient at bedside to discuss CIR recommendations and goals/expectations of CIR stay.  We reviewed 3 hrs/day of therapy, physician follow up, and average length of stay 2 weeks (dependent upon progress) with goals of supervision w/c level, household ambulation.  Discussed caregiver support and she is home with spouse and her mom.  Mom is able bodied and able to provide supervision and some physical assist with ADLs.  Spouse works outside the home 2-3 days each week, 12 hr shifts.  House is largely w/c accessible if needed.  We discussed pending TEE, timing tbd, and HTA prior auth process. I will ask rehab MD to see tomorrow and follow for timing of opening insurance.   Loye Rumble, PT, DPT Admissions Coordinator [phone] 12/19/23  3:54 PM

## 2023-12-20 ENCOUNTER — Encounter (HOSPITAL_COMMUNITY): Payer: Self-pay | Admitting: Family Medicine

## 2023-12-20 DIAGNOSIS — T8149XA Infection following a procedure, other surgical site, initial encounter: Secondary | ICD-10-CM | POA: Diagnosis not present

## 2023-12-20 DIAGNOSIS — Z89512 Acquired absence of left leg below knee: Secondary | ICD-10-CM

## 2023-12-20 DIAGNOSIS — R7881 Bacteremia: Secondary | ICD-10-CM | POA: Diagnosis not present

## 2023-12-20 DIAGNOSIS — N189 Chronic kidney disease, unspecified: Secondary | ICD-10-CM

## 2023-12-20 DIAGNOSIS — N184 Chronic kidney disease, stage 4 (severe): Secondary | ICD-10-CM | POA: Diagnosis not present

## 2023-12-20 DIAGNOSIS — D72829 Elevated white blood cell count, unspecified: Secondary | ICD-10-CM | POA: Diagnosis not present

## 2023-12-20 DIAGNOSIS — I5042 Chronic combined systolic (congestive) and diastolic (congestive) heart failure: Secondary | ICD-10-CM | POA: Diagnosis not present

## 2023-12-20 DIAGNOSIS — B9562 Methicillin resistant Staphylococcus aureus infection as the cause of diseases classified elsewhere: Secondary | ICD-10-CM | POA: Diagnosis not present

## 2023-12-20 DIAGNOSIS — E1022 Type 1 diabetes mellitus with diabetic chronic kidney disease: Secondary | ICD-10-CM | POA: Diagnosis not present

## 2023-12-20 LAB — BASIC METABOLIC PANEL WITH GFR
Anion gap: 9 (ref 5–15)
BUN: 60 mg/dL — ABNORMAL HIGH (ref 6–20)
CO2: 27 mmol/L (ref 22–32)
Calcium: 8.2 mg/dL — ABNORMAL LOW (ref 8.9–10.3)
Chloride: 96 mmol/L — ABNORMAL LOW (ref 98–111)
Creatinine, Ser: 2.44 mg/dL — ABNORMAL HIGH (ref 0.44–1.00)
GFR, Estimated: 22 mL/min — ABNORMAL LOW (ref >60)
Glucose, Bld: 128 mg/dL — ABNORMAL HIGH (ref 70–99)
Potassium: 4.5 mmol/L (ref 3.5–5.1)
Sodium: 132 mmol/L — ABNORMAL LOW (ref 135–145)

## 2023-12-20 LAB — GLUCOSE, CAPILLARY
Glucose-Capillary: 131 mg/dL — ABNORMAL HIGH (ref 70–99)
Glucose-Capillary: 156 mg/dL — ABNORMAL HIGH (ref 70–99)
Glucose-Capillary: 158 mg/dL — ABNORMAL HIGH (ref 70–99)
Glucose-Capillary: 164 mg/dL — ABNORMAL HIGH (ref 70–99)
Glucose-Capillary: 231 mg/dL — ABNORMAL HIGH (ref 70–99)
Glucose-Capillary: 66 mg/dL — ABNORMAL LOW (ref 70–99)
Glucose-Capillary: 74 mg/dL (ref 70–99)

## 2023-12-20 LAB — CBC
HCT: 27.3 % — ABNORMAL LOW (ref 36.0–46.0)
Hemoglobin: 8.4 g/dL — ABNORMAL LOW (ref 12.0–15.0)
MCH: 27 pg (ref 26.0–34.0)
MCHC: 30.8 g/dL (ref 30.0–36.0)
MCV: 87.8 fL (ref 80.0–100.0)
Platelets: 297 10*3/uL (ref 150–400)
RBC: 3.11 MIL/uL — ABNORMAL LOW (ref 3.87–5.11)
RDW: 16.7 % — ABNORMAL HIGH (ref 11.5–15.5)
WBC: 14.5 10*3/uL — ABNORMAL HIGH (ref 4.0–10.5)
nRBC: 0 % (ref 0.0–0.2)

## 2023-12-20 LAB — SURGICAL PATHOLOGY

## 2023-12-20 LAB — CK: Total CK: 8 U/L — ABNORMAL LOW (ref 38–234)

## 2023-12-20 MED ORDER — INSULIN ASPART 100 UNIT/ML IJ SOLN
0.0000 [IU] | Freq: Three times a day (TID) | INTRAMUSCULAR | Status: DC
  Administered 2023-12-20: 2 [IU] via SUBCUTANEOUS
  Administered 2023-12-21 (×2): 1 [IU] via SUBCUTANEOUS
  Administered 2023-12-21: 2 [IU] via SUBCUTANEOUS
  Administered 2023-12-22: 1 [IU] via SUBCUTANEOUS
  Administered 2023-12-22: 3 [IU] via SUBCUTANEOUS
  Administered 2023-12-22: 1 [IU] via SUBCUTANEOUS
  Administered 2023-12-23: 3 [IU] via SUBCUTANEOUS
  Administered 2023-12-23: 1 [IU] via SUBCUTANEOUS
  Administered 2023-12-24: 2 [IU] via SUBCUTANEOUS
  Administered 2023-12-24 – 2023-12-26 (×3): 1 [IU] via SUBCUTANEOUS

## 2023-12-20 MED ORDER — FUROSEMIDE 10 MG/ML IJ SOLN
60.0000 mg | Freq: Every day | INTRAMUSCULAR | Status: DC
  Administered 2023-12-21 – 2023-12-23 (×3): 60 mg via INTRAVENOUS
  Filled 2023-12-20 (×3): qty 6

## 2023-12-20 MED ORDER — INSULIN ASPART 100 UNIT/ML IJ SOLN
0.0000 [IU] | INTRAMUSCULAR | Status: DC
  Administered 2023-12-20 (×2): 1 [IU] via SUBCUTANEOUS

## 2023-12-20 MED ORDER — DAPTOMYCIN-SODIUM CHLORIDE 700-0.9 MG/100ML-% IV SOLN
700.0000 mg | INTRAVENOUS | Status: DC
  Administered 2023-12-20 – 2023-12-26 (×4): 700 mg via INTRAVENOUS
  Filled 2023-12-20 (×4): qty 100

## 2023-12-20 NOTE — Progress Notes (Signed)
 Patient's blood sugar is 131 after receiving 620 mL of orange juice and peanut butter cracker. One unit of insulin  held. Rathore MD notified. No new orders.

## 2023-12-20 NOTE — Assessment & Plan Note (Deleted)
 12-20-2023 remains on IV Daptomycin  for her MRSA bacteremia. Needs TEE on Monday. Then hopefully ins authorization for CIR.  12-21-2023 remains on IV Dapto. After her TEE on Monday, ID service to decide remaining duration of her abx therapy.   12-22-2023 NPO after MN. For TEE tomorrow.   12-23-2023 Pt had TEE today. Negative for endocarditis. ID says that she will only need a few more days of IV dapto.  12-24-2023 Currently receiving daptomycin  700mg  Q 48hr, next doses would be 6/10, 6/12 and 6/14. Then stop.

## 2023-12-20 NOTE — Assessment & Plan Note (Deleted)
 12-20-2023 continue with IV dilaudid  prn. POD #2  12-21-2023 still using prn IV dilaudid . Continue  IV dilaudid  for now. POD #3  12-22-2023 still using prn IV dilaudid . POD #4. Continue IV dilaudid  for 1-2 more days.  12-23-2023 pt states she has stopped using IV dilaudid . Will DC it off her MAR. Continue with po oxycodone . POD #5  12-24-2023 POD #6. Pt states pt controlled with oxycodone . Awaiting CIR admission.

## 2023-12-20 NOTE — Progress Notes (Signed)
 PROGRESS NOTE    MARDELLE PANDOLFI  ZOX:[id number] DOB: 03/07/1961 DOA: 12/14/2023 PCP: Mercy Stall, MD  Subjective: Pt seen and examined. Asked why she still have foley catheter in place. RN unsure. Pt unsure if she had it prior to arrival. RN to call pt's dtr and find out if foley is chronic.  Awaiting TEE on Monday 12-23-2023.   Hospital Course: HPI: Dana [name] is a pleasant 63 y.o. female with medical history significant for insulin -dependent diabetes mellitus, CKD stage IV, CAD, chronic hypoxic respiratory failure, OSA on CPAP, chronic HFmrEF, depression, anxiety, chronic pain, and left trimalleolar ankle fracture/dislocation status postoperative repair on 11/13/2023 who presented to the [facility] ED the night of 12/12/2023 with fevers, dysuria, and flank pain.   [facility] ED and Hospital Course: Upon arrival to the ED, patient is found to be febrile.  She had a leukocytosis 24,700.  CT of the abdomen and pelvis demonstrates bladder wall thickening with surrounding fat stranding.  Blood and urine cultures were collected, she was given IV fluids and broad-spectrum antibiotics, and was admitted to the hospitalist service.   Shortly after admission, it was noted that she had purulent drainage from left heel wound as well as open wounds at the medial aspect of the left ankle.  Orthopedic surgery at [facility] [place], [place]) was consulted and recommended medical admission to [facility] where they will consult.   Blood cultures at [facility] are growing MRSA.  Significant Events: Admitted 12/14/2023 for surgical site infection left ankle with MRSA 12-18-2023 left BKA  Admission Labs: Na 134, K 4.3, CO2 of 22, BUN 50, Scr 2.25, glu 137 T. Prot 6.5, alb 2.1, AST 11, ALT 11, alk phos 144  Admission Imaging Studies: CXR Prominent cardiac silhouette and vascular congestion  XR left ankle Postop changes. Disrupted ankle mortise. Displaced fractures. Soft tissue swelling CT  renal stone No hydronephrosis. 2. Mild nonspecific bilateral perinephric fat stranding, mildly increased. Correlate clinically for infection. 3. Enlarged left ovary with surrounding inflammatory stranding and fluid, a new finding. Findings may be related to ovarian torsion or infection. Recommend further evaluation with pelvic ultrasound. 4. Increasing body wall edema and presacral edema. 5. Trace bilateral pleural effusions. 6. Cardiomegaly. 7. Mild splenomegaly. 8. Aortic atherosclerosis Pelvic U/S Status post hysterectomy. 2. Nonvisualization of the ovaries bilaterally. If indicated, pelvic MRI examination with contrast may be helpful for further evaluation. 3. Trace simple appearing free fluid within the right adnexa, nonspecific  Significant Labs:   Significant Imaging Studies: 12-15-2023 LE U/S negative for DVT 12-15-2023 echo LVEF 40%, hx of mitral clip 12-16-2023 V/Q scan No evidence of acute pulmonary embolism on perfusion scintigraphy by PISAPED criteria. Mildly heterogeneous left perihilar perfusion, similar to previous examination  Antibiotic Therapy: Anti-infectives (From admission, onward)    Start     Dose/Rate Route Frequency Ordered Stop   12/20/23 1000  DAPTOmycin (CUBICIN) IVPB 700 mg/100mL premix  Status:  Discontinued        700 mg 200 mL/hr over 30 Minutes Intravenous Daily 12/19/23 1009 12/20/23 0821   12/20/23 1000  DAPTOmycin (CUBICIN) IVPB 700 mg/164mL premix        700 mg 200 mL/hr over 30 Minutes Intravenous Every 48 hours 12/20/23 0821     12/18/23 0600  vancomycin  (VANCOREADY) IVPB 1250 mg/250 mL  Status:  Discontinued        1,250 mg 166.7 mL/hr over 90 Minutes Intravenous Every 48 hours 12/17/23 1452 12/19/23 1009   12/18/23 0600  ceFAZolin  (  ANCEF ) IVPB 2g/100 mL premix  Status:  Discontinued        2 g 200 mL/hr over 30 Minutes Intravenous On call to O.R. 12/17/23 2121 12/18/23 1254   12/16/23 1400  ceFAZolin  (ANCEF ) IVPB 1 g/50 mL premix        1 g 100  mL/hr over 30 Minutes Intravenous Every 8 hours 12/16/23 0937 12/20/23 0015   12/16/23 0600  Vancomycin  (VANCOCIN ) 1,250 mg in sodium chloride  0.9 % 250 mL IVPB  Status:  Discontinued        1,250 mg 166.7 mL/hr over 90 Minutes Intravenous Every 48 hours 12/14/23 1009 12/17/23 1452   12/14/23 1800  piperacillin-tazobactam (ZOSYN) IVPB 3.375 g  Status:  Discontinued        3.375 g 12.5 mL/hr over 240 Minutes Intravenous Every 8 hours 12/14/23 1710 12/16/23 0940   12/14/23 0600  vancomycin  (VANCOCIN ) IVPB 1000 mg/200 mL premix  Status:  Discontinued        1,000 mg 200 mL/hr over 60 Minutes Intravenous Every 24 hours 12/14/23 0210 12/14/23 1009   12/14/23 0215  vancomycin  (VANCOCIN ) IVPB 1000 mg/200 mL premix  Status:  Discontinued        1,000 mg 200 mL/hr over 60 Minutes Intravenous  Once 12/14/23 0122 12/14/23 0130   12/14/23 0215  cefTRIAXone  (ROCEPHIN ) 2 g in sodium chloride  0.9 % 100 mL IVPB  Status:  Discontinued        2 g 200 mL/hr over 30 Minutes Intravenous Daily at bedtime 12/14/23 0155 12/14/23 1703       Procedures: 12-18-2023 left BKA   Consultants: Infectious Disease Orthopedics Psych Cardiology    Assessment and Plan: * Surgical site infection 12-20-2023 remains on IV Daptomycin for her MRSA bacteremia. Needs TEE on Monday. Then hopefully ins authorization for CIR.  S/P BKA (below knee amputation), left (HCC) 12-20-2023 continue with IV dilaudid  prn. POD #2  MRSA bacteremia 12-20-2023 remains on IV Daptomycin for her MRSA bacteremia. Needs TEE on Monday. Then hopefully ins authorization for CIR.  Depression with anxiety 12-20-2023 psych has signed off.  Coronary artery disease involving native coronary artery of native heart with angina pectoris (HCC) 12-20-2023 stable.  CKD stage 4 due to type 1 diabetes mellitus (HCC) - baseline 2.2 - 2.4 12-20-2023 stable Scr. 2.44  Generalized anxiety disorder 12-20-2023 psych has signed off.  Uncontrolled type  1 diabetes mellitus with hyperglycemia, with long-term current use of insulin  (HCC) 12-20-2023 had hypoglycemia last night. Long acting insulin  stopped. Changed to lowest dose SSI.  Last A1c of 10.2% in April, 2025. Monitor for DKA.  HgBA1c and mean plasma glucose: Lab Results  Component Value Date/Time   HGBA1C 10.2 (H) 11/12/2023 06:19 AM   MPG 246.04 11/12/2023 06:19 AM     OSA on CPAP 12-20-2023 stable.  Chronic respiratory failure with hypoxia (HCC) 12-20-2023 stable.  Chronic combined systolic (congestive) and diastolic (congestive) heart failure (HCC) 12-20-2023 cardiology has decreased IV lasix  to 60 mg daily.  UTI (urinary tract infection)-resolved as of 12/20/2023 Pt completed 5 days of IV ancef  for her UTI.  DVT prophylaxis: heparin  injection 5,000 Units Start: 12/14/23 1400 SCDs Start: 12/14/23 0119    Code Status: Full Code Family Communication: pt is decisional. No family at bedside Disposition Plan: CIR vs SNF Reason for continuing need for hospitalization: remains on IV dapto. Awaiting TEE on Monday.  Objective: Vitals:   12/20/23 0345 12/20/23 0800 12/20/23 1512 12/20/23 1514  BP: (!) 121/55 129/61 (!) 127/58 (!) 127/58  Pulse: 62 68 66 66  Resp: 18 16 17 14   Temp: 98.4 F (36.9 C) 98 F (36.7 C)  98.3 F (36.8 C)  TempSrc: Oral Oral  Oral  SpO2: 99% 97% 95% 95%  Weight:      Height:        Intake/Output Summary (Last 24 hours) at 12/20/2023 1850 Last data filed at 12/20/2023 0347 Gross per 24 hour  Intake 340 ml  Output 750 ml  Net -410 ml   Filed Weights   12/14/23 0100 12/14/23 0200  Weight: 103.3 kg 98 kg    Examination:  Physical Exam Vitals and nursing note reviewed.  Constitutional:      Appearance: She is obese.  HENT:     Head: Normocephalic and atraumatic.     Nose: Nose normal.  Cardiovascular:     Rate and Rhythm: Normal rate and regular rhythm.  Pulmonary:     Effort: Pulmonary effort is normal.     Breath sounds: Normal  breath sounds.  Abdominal:     General: Bowel sounds are normal.     Palpations: Abdomen is soft.  Genitourinary:    Comments: +foley catheter Musculoskeletal:     Comments: Left bka  Skin:    Capillary Refill: Capillary refill takes less than 2 seconds.  Neurological:     Mental Status: She is alert and oriented to person, place, and time.     Data Reviewed: I have personally reviewed following labs and imaging studies  CBC: Recent Labs  Lab 12/16/23 0315 12/17/23 0409 12/18/23 0351 12/19/23 0433 12/20/23 0509  WBC 11.0* 14.9* 13.4* 17.3* 14.5*  HGB 9.2* 9.4* 9.5* 9.3* 8.4*  HCT 28.9* 30.0* 30.4* 30.7* 27.3*  MCV 85.0 85.7 87.6 87.5 87.8  PLT 250 278 280 316 297   Basic Metabolic Panel: Recent Labs  Lab 12/14/23 0214 12/15/23 0253 12/16/23 0315 12/17/23 0409 12/18/23 0351 12/19/23 0433 12/20/23 0509  NA 134*   < > 133* 135 135 133* 132*  K 4.3   < > 4.3 4.3 4.5 4.6 4.5  CL 98   < > 102 102 99 97* 96*  CO2 22   < > 21* 24 25 25 27   GLUCOSE 137*   < > 111* 159* 80 238* 128*  BUN 50*   < > 55* 52* 57* 59* 60*  CREATININE 2.25*   < > 2.16* 2.06* 2.05* 2.25* 2.44*  CALCIUM  8.4*   < > 8.1* 8.4* 8.8* 8.5* 8.2*  MG 2.1  --   --   --   --   --   --    < > = values in this interval not displayed.   GFR: Estimated Creatinine Clearance: 28.4 mL/min (A) (by C-G formula based on SCr of 2.44 mg/dL (H)). Liver Function Tests: Recent Labs  Lab 12/14/23 0214  AST 11*  ALT 11  ALKPHOS 144*  BILITOT 0.6  PROT 6.5  ALBUMIN  2.1*   Cardiac Enzymes: Recent Labs  Lab 12/20/23 0509  CKTOTAL 8*   BNP (last 3 results) Recent Labs    07/13/23 1325 08/26/23 2233  BNP 212.4* 511.3*   CBG: Recent Labs  Lab 12/20/23 0041 12/20/23 0342 12/20/23 0817 12/20/23 1142 12/20/23 1559  GLUCAP 74 131* 158* 164* 156*   Sepsis Labs: Recent Labs  Lab 12/14/23 0214 12/14/23 1703  PROCALCITON  --  6.90  LATICACIDVEN 1.2  --     Recent Results (from the past 240  hours)  Urine Culture (for pregnant,  neutropenic or urologic patients or patients with an indwelling urinary catheter)     Status: Abnormal   Collection Time: 12/14/23  1:56 AM   Specimen: Urine, Clean Catch  Result Value Ref Range Status   Specimen Description URINE, CLEAN CATCH  Final   Special Requests NONE  Final   Culture (A)  Final    <10,000 COLONIES/mL INSIGNIFICANT GROWTH Performed at [facility], [address] [place], [place] [address]    Report Status 12/15/2023 FINAL  Final  Culture, blood (x 2)     Status: None   Collection Time: 12/14/23  2:14 AM   Specimen: BLOOD RIGHT ARM  Result Value Ref Range Status   Specimen Description BLOOD RIGHT ARM  Final   Special Requests   Final    BOTTLES DRAWN AEROBIC AND ANAEROBIC Blood Culture adequate volume   Culture   Final    NO GROWTH 5 DAYS Performed at [facility], [address] [place], [place] [address]    Report Status 12/19/2023 FINAL  Final  Culture, blood (x 2)     Status: None   Collection Time: 12/14/23  2:14 AM   Specimen: BLOOD RIGHT HAND  Result Value Ref Range Status   Specimen Description BLOOD RIGHT HAND  Final   Special Requests   Final    BOTTLES DRAWN AEROBIC AND ANAEROBIC Blood Culture adequate volume   Culture   Final    NO GROWTH 5 DAYS Performed at [facility], [address], [place], [place] [address]    Report Status 12/19/2023 FINAL  Final  Surgical pcr screen     Status: None   Collection Time: 12/18/23  9:15 AM   Specimen: Nasal Mucosa; Nasal Swab  Result Value Ref Range Status   MRSA, PCR NEGATIVE NEGATIVE Final   Staphylococcus aureus NEGATIVE NEGATIVE Final    Comment: (NOTE) The Xpert SA Assay (FDA approved for NASAL specimens in patients 15 years of age and older), is one component of a comprehensive surveillance program. It is not intended to diagnose infection nor to guide or monitor treatment. Performed at [facility], [address],  [place], [place] [address]      Radiology Studies: No results found.  Scheduled Meds:  allopurinol   50 mg Oral Daily   aspirin  EC  81 mg Oral Daily   carvedilol   6.25 mg Oral BID WC   Chlorhexidine  Gluconate Cloth  6 each Topical Daily   escitalopram  10 mg Oral Daily   [START ON 12/21/2023] furosemide   60 mg Intravenous Daily   gabapentin   300 mg Oral TID   heparin  injection (subcutaneous)  5,000 Units Subcutaneous Q8H   insulin  aspart  0-6 Units Subcutaneous TID AC & HS   latanoprost   1 drop Both Eyes QHS   pantoprazole   40 mg Oral Daily   polyethylene glycol  17 g Oral Daily   senna  2 tablet Oral QHS   sodium chloride  flush  3 mL Intravenous Q12H   ticagrelor   90 mg Oral BID   Continuous Infusions:  DAPTOmycin 700 mg (12/20/23 1137)     LOS: 6 days   Time spent: 50 minutes  Unk Garb, DO  [facility]  12/20/2023, 6:50 PM

## 2023-12-20 NOTE — Progress Notes (Signed)
 [facility]  Date of Admission:  12/14/2023     Reason for Follow Up: Surgical site infection  Total days of antibiotics 8         ASSESSMENT:  [name] is a day postop day #2 from left below knee amputation in the setting of MRSA bacteremia with left ankle infection and E. coli pyelonephritis.  Blood cultures finalized without growth indicating clearance of bacteremia.  Awaiting TEE planned for Monday, 12/23/2023.  Reviewed plan of care to continue current dose of vancomycin  for MRSA infection.  Noted increase in creatinine and decreased renal function and will change vancomycin  to daptomycin.  Therapeutic drug monitoring of CK levels while on daptomycin.  Previous vancomycin  levels random level 21 which was therapeutic.  Await TEE results to determine duration of treatment.  Completed course for pyelonephritis with no additional antibiotics needed.  Discussed importance of blood sugar control and protein intake to assist in healing and reduce risk for complicated healing and further infection.  Continue contact precautions.  Postoperative wound care per orthopedics.  Remaining medical and supportive care per internal medicine.  PLAN:  Continue current dose of daptomycin. Therapeutic drug monitoring of CK levels while on daptomycin per protocol. Postoperative wound care per orthopedics. No additional antibiotics needed for pyelonephritis. Optimize protein intake and blood sugar control. Contact precautions for MRSA. Await TEE results to determine duration of treatment Remaining medical and supportive care per internal medicine.  Dr. Levern Reader is available over the weekend for any ID related questions.   Principal Problem:   Surgical site infection Active Problems:   Chronic combined systolic (congestive) and diastolic (congestive) heart failure (HCC)   Chronic respiratory failure with hypoxia (HCC)   OSA on CPAP   Uncontrolled type 1 diabetes mellitus with  hyperglycemia, with long-term current use of insulin  (HCC)   Generalized anxiety disorder   CKD stage 4 due to type 1 diabetes mellitus (HCC)   Coronary artery disease involving native coronary artery of native heart with angina pectoris (HCC)   MRSA bacteremia   Depression with anxiety   UTI (urinary tract infection)   Severe protein-calorie malnutrition (HCC)   Charcot's joint of foot in type 2 diabetes mellitus (HCC)   High pulmonary arterial pressure (HCC)    allopurinol   50 mg Oral Daily   aspirin  EC  81 mg Oral Daily   carvedilol   6.25 mg Oral BID WC   Chlorhexidine  Gluconate Cloth  6 each Topical Daily   escitalopram  10 mg Oral Daily   [START ON 12/21/2023] furosemide   60 mg Intravenous Daily   gabapentin   300 mg Oral TID   heparin  injection (subcutaneous)  5,000 Units Subcutaneous Q8H   insulin  aspart  0-6 Units Subcutaneous Q4H   latanoprost   1 drop Both Eyes QHS   pantoprazole   40 mg Oral Daily   polyethylene glycol  17 g Oral Daily   senna  2 tablet Oral QHS   sodium chloride  flush  3 mL Intravenous Q12H   ticagrelor   90 mg Oral BID    SUBJECTIVE:  Afebrile overnight with no acute events. Doing okay. Had some looser bowel movements recently.   Allergies  Allergen Reactions   Naproxen Hives and Rash   Shellfish-Derived Products Hives, Swelling and Other (See Comments)    Angioedema    Strawberry (Diagnostic) Hives   Celecoxib Other (See Comments)    Stomach pain   Doxepin  Hcl Other (See Comments)    Lethargic, sleeping a  lot per pt and family   Glucosamine Hives, Swelling and Other (See Comments)    Angioedema    Iron  Hives and Other (See Comments)    IV iron  transfusion - hives - Feb 2022 hospitalization  Patient said she CAN tolerate if given Benadryl  first   Metoclopramide Other (See Comments)    Facial twitching and stuttering   Metolazone  Other (See Comments)    Acute renal failure   Statins Other (See Comments)    Muscle pain     Review of  Systems: Review of Systems  Constitutional:  Negative for chills, fever and weight loss.  Respiratory:  Negative for cough, shortness of breath and wheezing.   Cardiovascular:  Negative for chest pain and leg swelling.  Gastrointestinal:  Negative for abdominal pain, constipation, diarrhea, nausea and vomiting.  Skin:  Negative for rash.    OBJECTIVE: Vitals:   12/19/23 1943 12/20/23 0032 12/20/23 0345 12/20/23 0800  BP: (!) 120/50 (!) 139/59 (!) 121/55 129/61  Pulse: 61 (!) 53 62 68  Resp: 18 16 18 16   Temp: 98.1 F (36.7 C) 98.3 F (36.8 C) 98.4 F (36.9 C) 98 F (36.7 C)  TempSrc: Oral Oral Oral Oral  SpO2: 98% 99% 99% 97%  Weight:      Height:       Body mass index is 35.95 kg/m.  Physical Exam Constitutional:      General: She is not in acute distress.    Appearance: She is well-developed.  Cardiovascular:     Rate and Rhythm: Normal rate and regular rhythm.     Heart sounds: Normal heart sounds.  Pulmonary:     Effort: Pulmonary effort is normal.     Breath sounds: Normal breath sounds.  Skin:    General: Skin is warm and dry.  Neurological:     Mental Status: She is alert.     Lab Results Lab Results  Component Value Date   WBC 14.5 (H) 12/20/2023   HGB 8.4 (L) 12/20/2023   HCT 27.3 (L) 12/20/2023   MCV 87.8 12/20/2023   PLT 297 12/20/2023    Lab Results  Component Value Date   CREATININE 2.44 (H) 12/20/2023   BUN 60 (H) 12/20/2023   NA 132 (L) 12/20/2023   K 4.5 12/20/2023   CL 96 (L) 12/20/2023   CO2 27 12/20/2023    Lab Results  Component Value Date   ALT 11 12/14/2023   AST 11 (L) 12/14/2023   ALKPHOS 144 (H) 12/14/2023   BILITOT 0.6 12/14/2023     Microbiology: Recent Results (from the past 240 hours)  Urine Culture (for pregnant, neutropenic or urologic patients or patients with an indwelling urinary catheter)     Status: Abnormal   Collection Time: 12/14/23  1:56 AM   Specimen: Urine, Clean Catch  Result Value Ref Range Status    Specimen Description URINE, CLEAN CATCH  Final   Special Requests NONE  Final   Culture (A)  Final    <10,000 COLONIES/mL INSIGNIFICANT GROWTH Performed at [facility], [address], [place], [place] [address]    Report Status 12/15/2023 FINAL  Final  Culture, blood (x 2)     Status: None   Collection Time: 12/14/23  2:14 AM   Specimen: BLOOD RIGHT ARM  Result Value Ref Range Status   Specimen Description BLOOD RIGHT ARM  Final   Special Requests   Final    BOTTLES DRAWN AEROBIC AND ANAEROBIC Blood Culture adequate volume  Culture   Final    NO GROWTH 5 DAYS Performed at [facility], [address], [place], [place] [address]    Report Status 12/19/2023 FINAL  Final  Culture, blood (x 2)     Status: None   Collection Time: 12/14/23  2:14 AM   Specimen: BLOOD RIGHT HAND  Result Value Ref Range Status   Specimen Description BLOOD RIGHT HAND  Final   Special Requests   Final    BOTTLES DRAWN AEROBIC AND ANAEROBIC Blood Culture adequate volume   Culture   Final    NO GROWTH 5 DAYS Performed at [facility], [address] [place], [place] [address]    Report Status 12/19/2023 FINAL  Final  Surgical pcr screen     Status: None   Collection Time: 12/18/23  9:15 AM   Specimen: Nasal Mucosa; Nasal Swab  Result Value Ref Range Status   MRSA, PCR NEGATIVE NEGATIVE Final   Staphylococcus aureus NEGATIVE NEGATIVE Final    Comment: (NOTE) The Xpert SA Assay (FDA approved for NASAL specimens in patients 34 years of age and older), is one component of a comprehensive surveillance program. It is not intended to diagnose infection nor to guide or monitor treatment. Performed at [facility], [address], [place], [place] [address]     I have personally spent 28 minutes involved in face-to-face and non-face-to-face activities for this patient on the day of the visit. Professional time spent includes the following activities: Preparing to see the  patient (review of tests), performing a medically appropriate examination, ordering medications, communicating with other health care professionals, documenting clinical information in the EMR, communicating results and counseling patient regarding medication and plan of care, and care coordination.   Greg Dorraine Ellender, NP [facility]  12/20/2023  11:01 AM

## 2023-12-20 NOTE — Progress Notes (Signed)
 Physical Therapy Treatment Patient Details Name: Dana [name] MRN: [id number] DOB: 1961/05/10 Today's Date: 12/20/2023   History of Present Illness 63 y.o. female admitted  5/31 with Severe sepsis, present on admission  MRSA bacteremia,  Left ankle post op infection,  Bilateral pyelonephritis. 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 PMH insulin -dependent diabetes mellitus, CKD stage IV, CAD, chronic hypoxic respiratory failure, OSA on CPAP, chronic HFmrEF, depression, anxiety, chronic pain, and left trimalleolar ankle fracture/dislocation status postoperative repair on 11/13/2023    PT Comments  Pt progressing well toward goals.  Emphasis on BKA exercises and transitions to sitting EOB, practicing sit to stands  progressing to pushing up from bed and arm rests with UE's x5.  Ending up in the chair.     If plan is discharge home, recommend the following: Two people to help with walking and/or transfers;A lot of help with bathing/dressing/bathroom;Assistance with cooking/housework;Assist for transportation   Can travel by private vehicle        Equipment Recommendations  Other (comment)    Recommendations for Other Services Rehab consult     Precautions / Restrictions Precautions Precautions: Fall Recall of Precautions/Restrictions: Impaired Precaution/Restrictions Comments: LLE would vac, L limb protector     Mobility  Bed Mobility Overal bed mobility: Needs Assistance Bed Mobility: Supine to Sit     Supine to sit: Mod assist, +2 for safety/equipment     General bed mobility comments: pt able to come up to elbows and sit upright with light mod.  Once up, she was able to assist scooting to EOB with min assist and +2 safety    Transfers Overall transfer level: Needs assistance Equipment used: Rolling walker (2 wheels) Transfers: Sit to/from Stand Sit to Stand: Min assist, Mod assist, +2 physical assistance (x5 trials including to [facility] and to recliner)           General transfer comment: Worked  on safe transfer technique x5 with min to mod assist of 2 persons    Ambulation/Gait             Pre-gait activities: pt worked on upright posture, w/shifting and transfer Animator Bed    Modified Rankin (Stroke Patients Only)       Balance     Sitting balance-Leahy Scale: Good       Standing balance-Leahy Scale: Poor Standing balance comment: reliant on the RW                            Communication Communication Communication: No apparent difficulties  Cognition Arousal: Alert Behavior During Therapy: WFL for tasks assessed/performed   PT - Cognitive impairments: No apparent impairments                         Following commands: Intact      Cueing Cueing Techniques: Verbal cues, Tactile cues  Exercises General Exercises - Lower Extremity Ankle Circles/Pumps: AROM, 10 reps, Supine, Right Quad Sets: Strengthening, Both, 10 reps, Supine Gluteal Sets: Strengthening, Both, 10 reps, Supine Amputee Exercises Hip Extension: AROM, Strengthening, 10 reps, Left Hip Flexion/Marching: AROM, Left, 10 reps, Supine Knee Flexion: AROM, Left, Seated, 10 reps    General Comments General comments (skin integrity, edema, etc.): vss      Pertinent Vitals/Pain Pain Assessment Pain Assessment: Faces Faces Pain Scale: Hurts  even more Pain Location: L LE with BKA exercise Pain Descriptors / Indicators: Sore Pain Intervention(s): Monitored during session, Premedicated before session    Home Living                          Prior Function            PT Goals (current goals can now be found in the care plan section) Acute Rehab PT Goals Patient Stated Goal: Get well, walk again PT Goal Formulation: With patient Time For Goal Achievement: 01/02/24 Potential to Achieve Goals: Good Progress towards PT goals: Progressing toward goals    Frequency    Min 2X/week       PT Plan      Co-evaluation              AM-PAC PT "6 Clicks" Mobility   Outcome Measure  Help needed turning from your back to your side while in a flat bed without using bedrails?: A Little Help needed moving from lying on your back to sitting on the side of a flat bed without using bedrails?: A Lot Help needed moving to and from a bed to a chair (including a wheelchair)?: A Lot Help needed standing up from a chair using your arms (e.g., wheelchair or bedside chair)?: A Lot Help needed to walk in hospital room?: Total Help needed climbing 3-5 steps with a railing? : Total 6 Click Score: 11    End of Session Equipment Utilized During Treatment: Gait belt Activity Tolerance: Patient tolerated treatment well Patient left: with call bell/phone within reach;in chair;with chair alarm set;Other (comment) Nurse Communication: Mobility status;Need for lift equipment PT Visit Diagnosis: Unsteadiness on feet (R26.81);Muscle weakness (generalized) (M62.81);Difficulty in walking, not elsewhere classified (R26.2);Pain Pain - Right/Left: Left Pain - part of body: Leg     Time: 1610-9604 PT Time Calculation (min) (ACUTE ONLY): 41 min  Charges:    $Therapeutic Exercise: 23-37 mins $Therapeutic Activity: 23-37 mins PT General Charges $$ ACUTE PT VISIT: 1 Visit                     12/20/2023  [name], PT Acute Rehabilitation Services [phone]  (office)   [name] 12/20/2023, 5:57 PM

## 2023-12-20 NOTE — Assessment & Plan Note (Deleted)
 12-20-2023 had hypoglycemia last night. Long acting insulin  stopped. Changed to lowest dose SSI.  Last A1c of 10.2% in April, 2025. Monitor for DKA.  HgBA1c and mean plasma glucose: Lab Results  Component Value Date/Time   HGBA1C 10.2 (H) 11/12/2023 06:19 AM   MPG 246.04 11/12/2023 06:19 AM   12-21-2023 CBG stable. Monitor for DKA. Last dose long acting insulin (lantus ) at 2 pm on 12-19-2023.  Monitor to make sure she is not slipping into DKA.  12-22-2023 stable.  12-23-2023 CBG stable off long acting insulin . No evidence of DKA.  12-24-2023 now that she no longer has to be NPO and it tolerating solid food, will add small dose of lantus  5 units daily and 3 units novolog  with each meal. Already on sensitive SSI.

## 2023-12-20 NOTE — Consult Note (Addendum)
 [facility] Psychiatric Consult Initial  Patient Name: .Dana [name]  MRN: [id number]  DOB: June 16, 1961  Consult Order details:  Orders (From admission, onward)     Start     Ordered   12/15/23 1144  IP CONSULT TO PSYCHIATRY       Comments: This is nonurgent consult, patient is significantly depressed, as she will be going for left BKA, does appear to depressed, she is seeing a therapist as an outpatient, and is asking to talk to psychiatry to see it helps with her emotion.  Ordering Provider: Epifanio Haste, MD  Provider:  (Not yet assigned)  Question Answer Comment  Location [facility]   Reason for Consult? depression      12/15/23 1144             Mode of Visit: In person    Psychiatry Consult Evaluation  Service Date: December 20, 2023 LOS:  LOS: 6 days  Chief Complaint "Not so hot"  Primary Psychiatric Diagnoses  General anxiety disorder   Assessment  Dana [name] is a 63 y.o. female admitted: Medicallyfor 12/14/2023 12:50 AM for history significant for insulin -dependent diabetes mellitus, CKD stage IV, CAD, chronic hypoxic respiratory failure, OSA on CPAP, chronic HFmrEF, depression, anxiety, chronic pain, and left trimalleolar ankle fracture/dislocation status postoperative repair on 11/13/2023 who presented to the [facility] ED the night of 12/12/2023 with fevers, dysuria, and flank pain, history of anxiety.  Her current presentation of excessive worry, feeling overwhelmed, feelings of pending doom, and difficulty concentrating is most consistent with GAD. Current outpatient psychotropic medications include Lexapro, Ativan , gabapentin  and historically she has had a positive response to these medications. She was compliant with medications prior to admission as evidenced by self-report. On initial examination, patient pleasant and easily expressed her thoughts and concerns. Please see plan below for detailed recommendations.   Diagnoses:  Active  Hospital problems: Principal Problem:   Surgical site infection Active Problems:   Chronic combined systolic (congestive) and diastolic (congestive) heart failure (HCC)   Chronic respiratory failure with hypoxia (HCC)   OSA on CPAP   Uncontrolled type 1 diabetes mellitus with hyperglycemia, with long-term current use of insulin  (HCC)   Generalized anxiety disorder   CKD stage 4 due to type 1 diabetes mellitus (HCC)   Coronary artery disease involving native coronary artery of native heart with angina pectoris (HCC)   MRSA bacteremia   Depression with anxiety   UTI (urinary tract infection)   Severe protein-calorie malnutrition (HCC)   Charcot's joint of foot in type 2 diabetes mellitus (HCC)   High pulmonary arterial pressure (HCC)    Plan   ## Psychiatric Medication Recommendations:  - Continue Lexapro 10 mg daily - Continue gabapentin  300 mg TID  - Continue Ativan  0.5 mg BID PRN - We will SIGN OFF at this time.  ## Medical Decision Making Capacity: Not specifically addressed in this encounter  ## Further Work-up:  -- most recent EKG on 11/11/2023 had QtC of 455 -- Pertinent labwork reviewed earlier this admission includes: chem panel, CBG, EKG, urine   ## Disposition:-- There are no psychiatric contraindications to discharge at this time  ## Behavioral / Environmental: - No specific recommendations at this time.     ## Safety and Observation Level:  - Based on my clinical evaluation, I estimate the patient to be at low risk of self harm in the current setting. - At this time, we recommend  routine. This decision is based on  my review of the chart including patient's history and current presentation, interview of the patient, mental status examination, and consideration of suicide risk including evaluating suicidal ideation, plan, intent, suicidal or self-harm behaviors, risk factors, and protective factors. This judgment is based on our ability to directly address suicide  risk, implement suicide prevention strategies, and develop a safety plan while the patient is in the clinical setting. Please contact our team if there is a concern that risk level has changed.  CSSR Risk Category:C-SSRS RISK CATEGORY: No Risk  Suicide Risk Assessment: Patient has following modifiable risk factors for suicide: none which we are addressing by therapy. Patient has following non-modifiable or demographic risk factors for suicide: none Patient has the following protective factors against suicide: Access to outpatient mental health care, Supportive family, Supportive friends, Cultural, spiritual, or religious beliefs that discourage suicide, Pets in the home, Frustration tolerance, no history of suicide attempts, and no history of NSSIB  Thank you for this consult request. Recommendations have been communicated to the primary team.  We will SIGN OFF at this time.   Gibril Mastro, MD       History of Present Illness  Relevant Aspects of [facility] Course:  Admitted on 12/14/2023 for history significant for insulin -dependent diabetes mellitus, CKD stage IV, CAD, chronic hypoxic respiratory failure, OSA on CPAP, chronic HFmrEF, depression, anxiety, chronic pain, and left trimalleolar ankle fracture/dislocation status postoperative repair on 11/13/2023 who presented to the [facility] ED the night of 12/12/2023 with fevers, dysuria, and flank pain; history of anxiety  Patient Report:   On interview, patient was smiling and appears in much better spirits.  Somewhat embarrassed as she had some incontinence of bowel and was waiting for nursing assistance.  Is excited for, inpatient rehab-"I feel better than I did about everything".  Worked with physical therapy yesterday and was proud of her progress.  Feels assured in her decision to have a below the knee amputation.  Notes that she is in a lot of pain today, however.  Has recently spoken with palliative care.  Did not speak with  family yesterday as "I wanted today to process everything".  Discussed that the psychiatric service would likely be signing off as she has past her major stressor and appears much improved.  Amenable to this.  Denies suicidal or homicidal ideation.  Denies auditory or visual hallucinations.  Thanked Clinical research associate for assisting her care.  Psych ROS:  Depression: low Anxiety:  high Mania (lifetime and current): none Psychosis: (lifetime and current): none   Review of Systems  Psychiatric/Behavioral:  The patient is nervous/anxious.      Psychiatric and Social History  Psychiatric History:  Information collected from patient and chart  Prev Dx/Sx: anxiety Current Psych Provider: none Home Meds (current): Lexapro, gabapentin , Ativan  Previous Med Trials: none Therapy: none  Prior Psych Hospitalization: none  Prior Self Harm: none Prior Violence: none  Family Psych History: none Family Hx suicide: none  Social History:  Occupational Hx: disability Living Situation: lives with her husband Spiritual Hx: yes Access to weapons/lethal means: none   Substance History No substance abuse history  Exam Findings  Physical Exam: completed by the MD, reviewed Vital Signs:  Temp:  [97.7 F (36.5 C)-98.4 F (36.9 C)] 98 F (36.7 C) (06/06 0800) Pulse Rate:  [53-68] 68 (06/06 0800) Resp:  [16-18] 16 (06/06 0800) BP: (111-139)/(50-61) 129/61 (06/06 0800) SpO2:  [95 %-99 %] 97 % (06/06 0800) FiO2 (%):  [28 %] 28 % (06/05 2112) Blood  pressure 129/61, pulse 68, temperature 98 F (36.7 C), temperature source Oral, resp. rate 16, height 5\' 5"  (1.651 m), weight 98 kg, SpO2 97%. Body mass index is 35.95 kg/m.  Physical Exam Vitals and nursing note reviewed.  Constitutional:      Appearance: Normal appearance.  HENT:     Head: Normocephalic.     Nose: Nose normal.  Pulmonary:     Effort: Pulmonary effort is normal.  Musculoskeletal:     Cervical back: Normal range of motion.   Neurological:     General: No focal deficit present.     Mental Status: She is alert and oriented to person, place, and time.     Mental Status Exam: General Appearance: Casual  Orientation:  Full (Time, Place, and Person)  Memory:  Immediate;   Good Recent;   Good Remote;   Good  Concentration:  Concentration: Good and Attention Span: Good  Recall:  Good  Attention  Good  Eye Contact:  Fair  Speech:  Clear and Coherent  Language:  Good  Volume:  Normal  Mood: Euthymic, somewhat bright  Affect:  Congruent  Thought Process:  Coherent  Thought Content:  logical  Suicidal Thoughts:  No  Homicidal Thoughts:  No  Judgement:  Good  Insight:  Good  Psychomotor Activity:  Decreased  Akathisia:  No  Fund of Knowledge:  Good      Assets:  Communication Skills Desire for Improvement Housing Intimacy Leisure Time Resilience Social Support  Cognition:  WNL  ADL's:  Impaired  AIMS (if indicated):        Other History   These have been pulled in through the EMR, reviewed, and updated if appropriate.  Family History:  The patient's family history includes Bipolar disorder in an other family member; Breast cancer (age of onset: 68) in her mother; Cancer in an other family member; Coronary artery disease in her mother; Depression in an other family member; Diabetes in her father; Diabetes type II in her mother; Hyperlipidemia in her mother; Hypertension in her father and mother; Lung cancer (age of onset: 30) in her mother; Mental illness in her sister; Schizophrenia in an other family member.  Medical History: Past Medical History:  Diagnosis Date   Acquired absence of left great toe (HCC)    Acquired absence of other left toe(s) (HCC)    ACS (acute coronary syndrome) (HCC)    Acute bronchitis due to COVID-19 virus 03/03/2023   Acute on chronic combined systolic and diastolic CHF (congestive heart failure) (HCC) 09/16/2023   Acute respiratory failure with hypoxia (HCC)  07/20/2023   Anemia    Anoxic brain injury (HCC)    Anxiety    CAD (coronary artery disease)    DES mLAD 04/07/15; DES dRCA & DES pPDA 08/30/16; DES mCX & PTCA OM1 09/29/20   Chronic diastolic (congestive) heart failure (HCC)    Chronic pain    Chronic systolic (congestive) heart failure (HCC)    CKD (chronic kidney disease)    Contrast dye induced nephropathy, possible 09/03/2020   COPD (chronic obstructive pulmonary disease) (HCC)    COVID-19 virus infection 03/03/2023   Demand ischemia (HCC)    Diabetes (HCC)type 1    Diastolic CHF (HCC)    Dyspnea    Encounter for screening mammogram for malignant neoplasm of breast 09/16/2023   Family history of breast cancer 10/23/2021   Gastro-esophageal reflux disease without esophagitis    Hyperlipidemia    Hypertension    Hypertensive heart disease  with heart failure (HCC)    Hypoxemia    Idiopathic gout, multiple sites    Leukocytosis 03/29/2022   Localized edema    Low back pain    Mitral regurgitation    Mitral regurgitation    Mixed hyperlipidemia    Mixed incontinence    Morbid (severe) obesity due to excess calories (HCC)    Neuromuscular disorder (HCC)    Neuropathy    Non-ST elevation (NSTEMI) myocardial infarction (HCC)    08/29/20, 09/29/20   OSA (obstructive sleep apnea) 09/03/2020   Other specified hypothyroidism    PAF (paroxysmal atrial fibrillation) (HCC)    s/p pulmonary vein isolation by cryoablation 10/04/15 Dr. Jearldine Mina in [facility]   PEA (Pulseless electrical activity) [facility])    ~ 01/13/21 at [facility] during admission DKA, volume overload, possible CAP, hypoxia s/p ACLS with ROSC ~ 10-15   Pneumonia    Primary insomnia    Rheumatoid arthritis (HCC)    Stroke (HCC)    Thyroid  disease    Type 1 diabetes mellitus (HCC)    URI with cough and congestion 08/22/2023   Vitamin D  deficiency     Surgical History: Past Surgical History:  Procedure Laterality Date   ABDOMINAL HYSTERECTOMY     Partial- Still has  both ovaries   AMPUTATION Left 12/18/2023   Procedure: AMPUTATION BELOW KNEE;  Surgeon: Timothy Ford, MD;  Location: [facility] OR;  Service: Orthopedics;  Laterality: Left;   ANKLE FUSION Left 11/13/2023   Procedure: HINDFOOT FUSION FIXATION OF ANKLE;  Surgeon: Laneta Pintos, MD;  Location: [facility] OR;  Service: Orthopedics;  Laterality: Left;   CARDIOVASCULAR STRESS TEST  08/13/2014   Nuclear; Normal   CARPAL TUNNEL RELEASE     CATARACT EXTRACTION, BILATERAL     CESAREAN SECTION     CHOLECYSTECTOMY     CORONARY ANGIOPLASTY WITH STENT PLACEMENT     3 blockages/ 1 stent   CORONARY BALLOON ANGIOPLASTY N/A 07/18/2023   Procedure: CORONARY BALLOON ANGIOPLASTY;  Surgeon: Odie Benne, MD;  Location: [facility] INVASIVE CV LAB;  Service: Cardiovascular;  Laterality: N/A;   CORONARY PRESSURE/FFR STUDY N/A 09/07/2022   Procedure: INTRAVASCULAR PRESSURE WIRE/FFR STUDY;  Surgeon: Swaziland, Peter M, MD;  Location: [facility] INVASIVE CV LAB;  Service: Cardiovascular;  Laterality: N/A;   CORONARY STENT INTERVENTION N/A 09/29/2020   Procedure: CORONARY STENT INTERVENTION;  Surgeon: Arnoldo Lapping, MD;  Location: [facility] INVASIVE CV LAB;  Service: Cardiovascular;  Laterality: N/A;  CFX   CORONARY STENT INTERVENTION N/A 09/07/2022   Procedure: CORONARY STENT INTERVENTION;  Surgeon: Swaziland, Peter M, MD;  Location: [facility] INVASIVE CV LAB;  Service: Cardiovascular;  Laterality: N/A;   CORONARY STENT INTERVENTION N/A 02/13/2023   Procedure: CORONARY STENT INTERVENTION;  Surgeon: Odie Benne, MD;  Location: [facility] INVASIVE CV LAB;  Service: Cardiovascular;  Laterality: N/A;  LAD   CORONARY ULTRASOUND/IVUS N/A 02/13/2023   Procedure: Coronary Ultrasound/IVUS;  Surgeon: Odie Benne, MD;  Location: [facility] INVASIVE CV LAB;  Service: Cardiovascular;  Laterality: N/A;   CORONARY/GRAFT ACUTE MI REVASCULARIZATION N/A 12/08/2022   Procedure: Coronary/Graft Acute MI Revascularization;  Surgeon: Arleen Lacer, MD;  Location: [facility]  INVASIVE CV LAB;  Service: Cardiovascular;  Laterality: N/A;   EYE SURGERY     LEFT HEART CATH AND CORONARY ANGIOGRAPHY N/A 09/29/2020   Procedure: LEFT HEART CATH AND CORONARY ANGIOGRAPHY;  Surgeon: Arnoldo Lapping, MD;  Location: [facility] INVASIVE CV LAB;  Service: Cardiovascular;  Laterality: N/A;   LEFT HEART CATH AND  CORONARY ANGIOGRAPHY N/A 09/07/2022   Procedure: LEFT HEART CATH AND CORONARY ANGIOGRAPHY;  Surgeon: Swaziland, Peter M, MD;  Location: [facility] INVASIVE CV LAB;  Service: Cardiovascular;  Laterality: N/A;   LEFT HEART CATH AND CORONARY ANGIOGRAPHY N/A 12/08/2022   Procedure: LEFT HEART CATH AND CORONARY ANGIOGRAPHY;  Surgeon: Arleen Lacer, MD;  Location: [facility] INVASIVE CV LAB;  Service: Cardiovascular;  Laterality: N/A;   LEFT HEART CATH AND CORONARY ANGIOGRAPHY N/A 02/13/2023   Procedure: LEFT HEART CATH AND CORONARY ANGIOGRAPHY;  Surgeon: Odie Benne, MD;  Location: [facility] INVASIVE CV LAB;  Service: Cardiovascular;  Laterality: N/A;   LEFT HEART CATH AND CORONARY ANGIOGRAPHY N/A 07/16/2023   Procedure: LEFT HEART CATH AND CORONARY ANGIOGRAPHY;  Surgeon: Odie Benne, MD;  Location: [facility] INVASIVE CV LAB;  Service: Cardiovascular;  Laterality: N/A;   RIGHT HEART CATH N/A 04/27/2021   Procedure: RIGHT HEART CATH;  Surgeon: Mardell Shade, MD;  Location: [facility] INVASIVE CV LAB;  Service: Cardiovascular;  Laterality: N/A;   RIGHT/LEFT HEART CATH AND CORONARY ANGIOGRAPHY N/A 01/07/2017   Procedure: Right/Left Heart Cath and Coronary Angiography;  Surgeon: Darlis Eisenmenger, MD;  Location: [facility] INVASIVE CV LAB;  Service: Cardiovascular;  Laterality: N/A;   ROTATOR CUFF REPAIR     TEE WITHOUT CARDIOVERSION N/A 04/28/2021   Procedure: TRANSESOPHAGEAL ECHOCARDIOGRAM (TEE);  Surgeon: Mardell Shade, MD;  Location: [facility] ENDOSCOPY;  Service: Cardiovascular;  Laterality: N/A;   TEE WITHOUT CARDIOVERSION N/A 05/18/2021   Procedure: TRANSESOPHAGEAL ECHOCARDIOGRAM (TEE);  Surgeon:  Thukkani, Arun K, MD;  Location: [facility] INVASIVE CV LAB;  Service: Cardiovascular;  Laterality: N/A;   TOE AMPUTATION Left    TRANSCATHETER MITRAL EDGE TO EDGE REPAIR N/A 05/18/2021   Procedure: MITRAL VALVE REPAIR;  Surgeon: Kyra Phy, MD;  Location: [facility] INVASIVE CV LAB;  Service: Cardiovascular;  Laterality: N/A;   US  ECHOCARDIOGRAPHY  05/2017   Normal     Medications:   Current Facility-Administered Medications:    acetaminophen  (TYLENOL ) tablet 650 mg, 650 mg, Oral, Q6H PRN, 650 mg at 12/17/23 2341 **OR** acetaminophen  (TYLENOL ) suppository 650 mg, 650 mg, Rectal, Q6H PRN, Opyd, Santana Cue, MD   allopurinol  (ZYLOPRIM ) tablet 50 mg, 50 mg, Oral, Daily, Opyd, Timothy S, MD, 50 mg at 12/19/23 0819   aspirin  EC tablet 81 mg, 81 mg, Oral, Daily, Elgergawy, Dawood S, MD, 81 mg at 12/19/23 0816   carvedilol  (COREG ) tablet 6.25 mg, 6.25 mg, Oral, BID WC, Elgergawy, Dawood S, MD, 6.25 mg at 12/20/23 1610   Chlorhexidine  Gluconate Cloth 2 % PADS 6 each, 6 each, Topical, Daily, Elgergawy, Ardia Kraft, MD, 6 each at 12/19/23 9604   DAPTOmycin (CUBICIN) IVPB 700 mg/100mL premix, 700 mg, Intravenous, Q48H, Farrel Hones, Eric, DO   escitalopram (LEXAPRO) tablet 10 mg, 10 mg, Oral, Daily, Elgergawy, Dawood S, MD, 10 mg at 12/19/23 5409   furosemide  (LASIX ) injection 60 mg, 60 mg, Intravenous, BID, Elgergawy, Dawood S, MD, 60 mg at 12/20/23 0815   gabapentin  (NEURONTIN ) capsule 300 mg, 300 mg, Oral, TID, Opyd, Timothy S, MD, 300 mg at 12/19/23 2046   heparin  injection 5,000 Units, 5,000 Units, Subcutaneous, Q8H, Elgergawy, Dawood S, MD, 5,000 Units at 12/20/23 8119   HYDROmorphone  (DILAUDID ) injection 0.5 mg, 0.5 mg, Intravenous, Q4H PRN, Elgergawy, Dawood S, MD, 0.5 mg at 12/20/23 1478   insulin  aspart (novoLOG ) injection 0-9 Units, 0-9 Units, Subcutaneous, Q4H, Opyd, Timothy S, MD, 2 Units at 12/20/23 0831   latanoprost  (XALATAN ) 0.005 % ophthalmic solution 1 drop,  1 drop, Both Eyes, QHS, Elgergawy, Ardia Kraft, MD,  1 drop at 12/19/23 2342   LORazepam  (ATIVAN ) tablet 0.5 mg, 0.5 mg, Oral, QID PRN, Elgergawy, Dawood S, MD, 0.5 mg at 12/19/23 2046   oxyCODONE  (Oxy IR/ROXICODONE ) immediate release tablet 5 mg, 5 mg, Oral, Q4H PRN, Opyd, Timothy S, MD, 5 mg at 12/20/23 0654   pantoprazole  (PROTONIX ) EC tablet 40 mg, 40 mg, Oral, Daily, Opyd, Timothy S, MD, 40 mg at 12/19/23 0816   polyethylene glycol (MIRALAX  / GLYCOLAX ) packet 17 g, 17 g, Oral, Daily, Ghimire, Estil Heman, MD   prochlorperazine  (COMPAZINE ) injection 5 mg, 5 mg, Intravenous, Q6H PRN, Opyd, Timothy S, MD, 5 mg at 12/18/23 0328   senna (SENOKOT) tablet 17.2 mg, 2 tablet, Oral, QHS, Ghimire, Shanker M, MD, 17.2 mg at 12/19/23 2047   sodium chloride  flush (NS) 0.9 % injection 10-40 mL, 10-40 mL, Intracatheter, PRN, Elgergawy, Dawood S, MD   sodium chloride  flush (NS) 0.9 % injection 3 mL, 3 mL, Intravenous, Q12H, Opyd, Timothy S, MD, 3 mL at 12/19/23 2049   ticagrelor  (BRILINTA ) tablet 90 mg, 90 mg, Oral, BID, Ghimire, Shanker M, MD, 90 mg at 12/19/23 2046  Allergies: Allergies  Allergen Reactions   Naproxen Hives and Rash   Shellfish-Derived Products Hives, Swelling and Other (See Comments)    Angioedema    Strawberry (Diagnostic) Hives   Celecoxib Other (See Comments)    Stomach pain   Doxepin  Hcl Other (See Comments)    Lethargic, sleeping a lot per pt and family   Glucosamine Hives, Swelling and Other (See Comments)    Angioedema    Iron  Hives and Other (See Comments)    IV iron  transfusion - hives - Feb 2022 hospitalization  Patient said she CAN tolerate if given Benadryl  first   Metoclopramide Other (See Comments)    Facial twitching and stuttering   Metolazone  Other (See Comments)    Acute renal failure   Statins Other (See Comments)    Muscle pain    Gwyndolyn Lerner, MD

## 2023-12-20 NOTE — Assessment & Plan Note (Addendum)
 12-20-2023 stable.  12-21-2023 stable.  12-22-2023 stable.  12-23-2023 stable  12-24-2023 stable.

## 2023-12-20 NOTE — Assessment & Plan Note (Addendum)
 Completed treatment.

## 2023-12-20 NOTE — Consult Note (Addendum)
 Physical Medicine and Rehabilitation Consult Reason for Consult: Evaluate appropriateness for Inpatient Rehab Referring Physician: Dr. Farrel Hones    HPI: Dana [name] is a 64 y.o. female with PMHx of  has a past medical history of Acquired absence of left great toe (HCC), Acquired absence of other left toe(s) (HCC), ACS (acute coronary syndrome) (HCC), Acute bronchitis due to COVID-19 virus (03/03/2023), Acute on chronic combined systolic and diastolic CHF (congestive heart failure) (HCC) (09/16/2023), Acute respiratory failure with hypoxia (HCC) (07/20/2023), Anemia, Anoxic brain injury (HCC), Anxiety, CAD (coronary artery disease), Chronic diastolic (congestive) heart failure (HCC), Chronic pain, Chronic systolic (congestive) heart failure (HCC), CKD (chronic kidney disease), Contrast dye induced nephropathy, possible (09/03/2020), COPD (chronic obstructive pulmonary disease) (HCC), COVID-19 virus infection (03/03/2023), Demand ischemia (HCC), Diabetes (HCC)type 1, Diastolic CHF (HCC), Dyspnea, Encounter for screening mammogram for malignant neoplasm of breast (09/16/2023), Family history of breast cancer (10/23/2021), Gastro-esophageal reflux disease without esophagitis, Hyperlipidemia, Hypertension, Hypertensive heart disease with heart failure (HCC), Hypoxemia, Idiopathic gout, multiple sites, Leukocytosis (03/29/2022), Localized edema, Low back pain, Mitral regurgitation, Mitral regurgitation, Mixed hyperlipidemia, Mixed incontinence, Morbid (severe) obesity due to excess calories (HCC), Neuromuscular disorder (HCC), Neuropathy, Non-ST elevation (NSTEMI) myocardial infarction (HCC), OSA (obstructive sleep apnea) (09/03/2020), Other specified hypothyroidism, PAF (paroxysmal atrial fibrillation) (HCC), PEA (Pulseless electrical activity) (HCC), Pneumonia, Primary insomnia, Rheumatoid arthritis (HCC), Stroke (HCC), Thyroid  disease, Type 1 diabetes mellitus (HCC), URI with cough and congestion  (08/22/2023), and Vitamin D  deficiency. . They were admitted to [facility] on 12/14/2023 from [facility] after presenting to the ED 5-29 for fevers, dysuria, and flank pain.  She was started on IV fluids and broad-spectrum antibiotics for suspected UTI with CT abdomen and pelvis showing bladder wall thickening on CT, and shortly after was noted to have purulent drainage from the left heel--wound cultures grew MRSA.  She was transferred to [facility], Dr. Julio Ohm consulted and recommended left BKA due to complete collapse of the left ankle after failed fusion, with cellulitis and ulceration in the setting of PVD and uncontrolled diabetes.  She underwent left BKA on 6/4.  Hospitalization was complicated by severe sepsis secondary to postop left ankle infection on IV vancomycin , Ancef  for E. coli pyelonephritis, acute on chronic HFrEF on diuretics, CKD stage IV, anemia, uncontrolled diabetes with A1c 10.2, severe depression and anxiety followed by psychiatry, and poor postop pain control managed with p.o. oxycodone  and IV Dilaudid .  PM&R was consulted to evaluate appropriateness for IPR admission.   Per chart review, she is living in a two-story home with first-floor set up with a ramped entrance.  She has 24-7 assistance available with her spouse and mom in the home.  At baseline, she uses a knee scooter and bedside commode, needs assistance for lower body dressing but typically manages ADLs and light IADLs.  She has a manual wheelchair in the home, but does not use it because it does not fit into their bathroom.  Currently, she is mod a x 2 for lateral scoot transfers, able to stand from edge of bed with mod assist +2 for support with rolling walker.  Unable to pivot due to weakness.  She is set up assist for upper body ADLs max assist +2 for lower body ADLs completed in standing.   Review of Systems  Constitutional:  Negative for chills and fever.  Respiratory:  Negative for shortness of  breath and wheezing.   Cardiovascular:  Positive for leg swelling. Negative for chest  pain.  Gastrointestinal:  Positive for diarrhea and nausea. Negative for abdominal pain, constipation and vomiting.       Incontinence  Genitourinary:        Foley  Musculoskeletal:  Positive for back pain.  Neurological:  Positive for tingling and weakness. Negative for headaches.  Psychiatric/Behavioral:  Positive for depression. The patient is nervous/anxious. The patient does not have insomnia.    Past Medical History:  Diagnosis Date   Acquired absence of left great toe (HCC)    Acquired absence of other left toe(s) (HCC)    ACS (acute coronary syndrome) (HCC)    Acute bronchitis due to COVID-19 virus 03/03/2023   Acute on chronic combined systolic and diastolic CHF (congestive heart failure) (HCC) 09/16/2023   Acute respiratory failure with hypoxia (HCC) 07/20/2023   Anemia    Anoxic brain injury (HCC)    Anxiety    CAD (coronary artery disease)    DES mLAD 04/07/15; DES dRCA & DES pPDA 08/30/16; DES mCX & PTCA OM1 09/29/20   Chronic diastolic (congestive) heart failure (HCC)    Chronic pain    Chronic systolic (congestive) heart failure (HCC)    CKD (chronic kidney disease)    Contrast dye induced nephropathy, possible 09/03/2020   COPD (chronic obstructive pulmonary disease) (HCC)    COVID-19 virus infection 03/03/2023   Demand ischemia (HCC)    Diabetes (HCC)type 1    Diastolic CHF (HCC)    Dyspnea    Encounter for screening mammogram for malignant neoplasm of breast 09/16/2023   Family history of breast cancer 10/23/2021   Gastro-esophageal reflux disease without esophagitis    Hyperlipidemia    Hypertension    Hypertensive heart disease with heart failure (HCC)    Hypoxemia    Idiopathic gout, multiple sites    Leukocytosis 03/29/2022   Localized edema    Low back pain    Mitral regurgitation    Mitral regurgitation    Mixed hyperlipidemia    Mixed incontinence    Morbid  (severe) obesity due to excess calories (HCC)    Neuromuscular disorder (HCC)    Neuropathy    Non-ST elevation (NSTEMI) myocardial infarction (HCC)    08/29/20, 09/29/20   OSA (obstructive sleep apnea) 09/03/2020   Other specified hypothyroidism    PAF (paroxysmal atrial fibrillation) (HCC)    s/p pulmonary vein isolation by cryoablation 10/04/15 Dr. Jearldine Mina in [facility]   PEA (Pulseless electrical activity) [facility])    ~ 01/13/21 at [facility] during admission DKA, volume overload, possible CAP, hypoxia s/p ACLS with ROSC ~ 10-15   Pneumonia    Primary insomnia    Rheumatoid arthritis (HCC)    Stroke (HCC)    Thyroid  disease    Type 1 diabetes mellitus (HCC)    URI with cough and congestion 08/22/2023   Vitamin D  deficiency    Past Surgical History:  Procedure Laterality Date   ABDOMINAL HYSTERECTOMY     Partial- Still has both ovaries   AMPUTATION Left 12/18/2023   Procedure: AMPUTATION BELOW KNEE;  Surgeon: Timothy Ford, MD;  Location: [facility] OR;  Service: Orthopedics;  Laterality: Left;   ANKLE FUSION Left 11/13/2023   Procedure: HINDFOOT FUSION FIXATION OF ANKLE;  Surgeon: Laneta Pintos, MD;  Location: [facility] OR;  Service: Orthopedics;  Laterality: Left;   CARDIOVASCULAR STRESS TEST  08/13/2014   Nuclear; Normal   CARPAL TUNNEL RELEASE     CATARACT EXTRACTION, BILATERAL     CESAREAN SECTION  CHOLECYSTECTOMY     CORONARY ANGIOPLASTY WITH STENT PLACEMENT     3 blockages/ 1 stent   CORONARY BALLOON ANGIOPLASTY N/A 07/18/2023   Procedure: CORONARY BALLOON ANGIOPLASTY;  Surgeon: Odie Benne, MD;  Location: [facility] INVASIVE CV LAB;  Service: Cardiovascular;  Laterality: N/A;   CORONARY PRESSURE/FFR STUDY N/A 09/07/2022   Procedure: INTRAVASCULAR PRESSURE WIRE/FFR STUDY;  Surgeon: Swaziland, Peter M, MD;  Location: [facility] INVASIVE CV LAB;  Service: Cardiovascular;  Laterality: N/A;   CORONARY STENT INTERVENTION N/A 09/29/2020   Procedure: CORONARY STENT INTERVENTION;  Surgeon:  Arnoldo Lapping, MD;  Location: [facility] INVASIVE CV LAB;  Service: Cardiovascular;  Laterality: N/A;  CFX   CORONARY STENT INTERVENTION N/A 09/07/2022   Procedure: CORONARY STENT INTERVENTION;  Surgeon: Swaziland, Peter M, MD;  Location: [facility] INVASIVE CV LAB;  Service: Cardiovascular;  Laterality: N/A;   CORONARY STENT INTERVENTION N/A 02/13/2023   Procedure: CORONARY STENT INTERVENTION;  Surgeon: Odie Benne, MD;  Location: [facility] INVASIVE CV LAB;  Service: Cardiovascular;  Laterality: N/A;  LAD   CORONARY ULTRASOUND/IVUS N/A 02/13/2023   Procedure: Coronary Ultrasound/IVUS;  Surgeon: Odie Benne, MD;  Location: [facility] INVASIVE CV LAB;  Service: Cardiovascular;  Laterality: N/A;   CORONARY/GRAFT ACUTE MI REVASCULARIZATION N/A 12/08/2022   Procedure: Coronary/Graft Acute MI Revascularization;  Surgeon: Arleen Lacer, MD;  Location: [facility] INVASIVE CV LAB;  Service: Cardiovascular;  Laterality: N/A;   EYE SURGERY     LEFT HEART CATH AND CORONARY ANGIOGRAPHY N/A 09/29/2020   Procedure: LEFT HEART CATH AND CORONARY ANGIOGRAPHY;  Surgeon: Arnoldo Lapping, MD;  Location: [facility] INVASIVE CV LAB;  Service: Cardiovascular;  Laterality: N/A;   LEFT HEART CATH AND CORONARY ANGIOGRAPHY N/A 09/07/2022   Procedure: LEFT HEART CATH AND CORONARY ANGIOGRAPHY;  Surgeon: Swaziland, Peter M, MD;  Location: [facility] INVASIVE CV LAB;  Service: Cardiovascular;  Laterality: N/A;   LEFT HEART CATH AND CORONARY ANGIOGRAPHY N/A 12/08/2022   Procedure: LEFT HEART CATH AND CORONARY ANGIOGRAPHY;  Surgeon: Arleen Lacer, MD;  Location: [facility] INVASIVE CV LAB;  Service: Cardiovascular;  Laterality: N/A;   LEFT HEART CATH AND CORONARY ANGIOGRAPHY N/A 02/13/2023   Procedure: LEFT HEART CATH AND CORONARY ANGIOGRAPHY;  Surgeon: Odie Benne, MD;  Location: [facility] INVASIVE CV LAB;  Service: Cardiovascular;  Laterality: N/A;   LEFT HEART CATH AND CORONARY ANGIOGRAPHY N/A 07/16/2023   Procedure: LEFT HEART CATH AND CORONARY ANGIOGRAPHY;   Surgeon: Odie Benne, MD;  Location: [facility] INVASIVE CV LAB;  Service: Cardiovascular;  Laterality: N/A;   RIGHT HEART CATH N/A 04/27/2021   Procedure: RIGHT HEART CATH;  Surgeon: Mardell Shade, MD;  Location: [facility] INVASIVE CV LAB;  Service: Cardiovascular;  Laterality: N/A;   RIGHT/LEFT HEART CATH AND CORONARY ANGIOGRAPHY N/A 01/07/2017   Procedure: Right/Left Heart Cath and Coronary Angiography;  Surgeon: Darlis Eisenmenger, MD;  Location: [facility] INVASIVE CV LAB;  Service: Cardiovascular;  Laterality: N/A;   ROTATOR CUFF REPAIR     TEE WITHOUT CARDIOVERSION N/A 04/28/2021   Procedure: TRANSESOPHAGEAL ECHOCARDIOGRAM (TEE);  Surgeon: Mardell Shade, MD;  Location: [facility] ENDOSCOPY;  Service: Cardiovascular;  Laterality: N/A;   TEE WITHOUT CARDIOVERSION N/A 05/18/2021   Procedure: TRANSESOPHAGEAL ECHOCARDIOGRAM (TEE);  Surgeon: Thukkani, Arun K, MD;  Location: [facility] INVASIVE CV LAB;  Service: Cardiovascular;  Laterality: N/A;   TOE AMPUTATION Left    TRANSCATHETER MITRAL EDGE TO EDGE REPAIR N/A 05/18/2021   Procedure: MITRAL VALVE REPAIR;  Surgeon: Kyra Phy, MD;  Location: [facility] INVASIVE CV LAB;  Service: Cardiovascular;  Laterality: N/A;   US  ECHOCARDIOGRAPHY  05/2017   Normal   Family History  Problem Relation Age of Onset   Breast cancer Mother 55       contralateral breast ca dx 48   Coronary artery disease Mother    Hypertension Mother    Hyperlipidemia Mother    Diabetes type II Mother    Lung cancer Mother 1   Diabetes Father    Hypertension Father    Mental illness Sister    Bipolar disorder Other    Depression Other    Schizophrenia Other    Cancer Other        MGF's sisters x2; unknown type   Social History:  reports that she quit smoking about 41 years ago. Her smoking use included cigarettes. She started smoking about 42 years ago. She has been exposed to tobacco smoke. She has never used smokeless tobacco. She reports that she does not drink alcohol and does  not use drugs. Allergies:  Allergies  Allergen Reactions   Naproxen Hives and Rash   Shellfish-Derived Products Hives, Swelling and Other (See Comments)    Angioedema    Strawberry (Diagnostic) Hives   Celecoxib Other (See Comments)    Stomach pain   Doxepin  Hcl Other (See Comments)    Lethargic, sleeping a lot per pt and family   Glucosamine Hives, Swelling and Other (See Comments)    Angioedema    Iron  Hives and Other (See Comments)    IV iron  transfusion - hives - Feb 2022 hospitalization  Patient said she CAN tolerate if given Benadryl  first   Metoclopramide Other (See Comments)    Facial twitching and stuttering   Metolazone  Other (See Comments)    Acute renal failure   Statins Other (See Comments)    Muscle pain   Medications Prior to Admission  Medication Sig Dispense Refill   acetaminophen  (TYLENOL ) 500 MG tablet Take 1,000 mg by mouth every 6 (six) hours as needed for mild pain (pain score 1-3) or headache.     allopurinol  (ZYLOPRIM ) 100 MG tablet Take 0.5 tablets (50 mg total) by mouth daily. 90 tablet 1   aspirin  EC 81 MG tablet Take 1 tablet (81 mg total) by mouth daily. Swallow whole. 30 tablet 12   busPIRone  (BUSPAR ) 15 MG tablet Take 15 mg by mouth 3 (three) times daily.     Continuous Glucose Sensor (FREESTYLE LIBRE 2 SENSOR) MISC Inject 1 device into skin every 14 (fourteen) days. 2 each 2   EPINEPHrine  0.3 mg/0.3 mL IJ SOAJ injection Use as directed for life-threatening allergic reaction. 4 each 3   Evolocumab  (REPATHA  SURECLICK) 140 MG/ML SOAJ Inject 140 mg into the skin every 14 (fourteen) days. 6 mL 1   ezetimibe  (ZETIA ) 10 MG tablet Take 1 tablet (10 mg total) by mouth every evening. 90 tablet 1   gabapentin  (NEURONTIN ) 300 MG capsule Take 1 capsule (300 mg total) by mouth 3 (three) times daily. 90 capsule 1   Glucagon  (GVOKE HYPOPEN  2-PACK) 0.5 MG/0.1ML SOAJ Inject 0.5 mg into the skin daily as needed. (Patient taking differently: Inject 0.5 mg into the  skin daily as needed (hypoglycemia).) 0.2 mL 5   HYDROcodone -acetaminophen  (NORCO) 7.5-325 MG tablet Take 1 tablet by mouth 3 (three) times daily as needed. 90 tablet 0   insulin  aspart (NOVOLOG  FLEXPEN) 100 UNIT/ML FlexPen Inject 2 - 10 Units before meals based on SSI. 15 mL 3   insulin  glargine (LANTUS  SOLOSTAR) 100 UNIT/ML  Solostar Pen Inject 45 Units into the skin daily. (Patient taking differently: Inject 30 Units into the skin daily.)     Insulin  Pen Needle (BD PEN NEEDLE NANO 2ND GEN) 32G X 4 MM MISC Inject 1 each into the skin in the morning, at noon, in the evening, and at bedtime. 600 each 3   latanoprost  (XALATAN ) 0.005 % ophthalmic solution Place 1 drop into both eyes at bedtime. 2.5 mL 0   linaclotide  (LINZESS ) 145 MCG CAPS capsule Take 1 capsule (145 mcg total) by mouth daily before breakfast. (Patient taking differently: Take 145 mcg by mouth as needed (constipation).) 90 capsule 1   LORazepam  (ATIVAN ) 0.5 MG tablet Take 1 tablet (0.5 mg total) by mouth daily as needed for anxiety. 90 tablet 1   magnesium  oxide (MAG-OX) 400 MG tablet Take 2 tablets (800 mg total) by mouth daily. Take 2 (400 mg) daily=800 mg 180 tablet 1   Menthol -Methyl Salicylate  (ICY HOT EXTRA STRENGTH) 10-30 % CREA Apply 1 Application topically as needed (bilateral hips and knees). 85 g 1   nitroGLYCERIN  (NITROSTAT ) 0.4 MG SL tablet DISSOLVE ONE TABLET UNDER THE TONGUE EVERY 5 MINUTES AS NEEDED FOR CHEST PAIN.  DO NOT EXCEED A TOTAL OF 3 DOSES IN 15 MINUTES 50 tablet 3   Omega-3 Fatty Acids (FISH OIL PO) Take 1 capsule by mouth daily.     OXYGEN  Inhale 2 L/min into the lungs at bedtime.     pantoprazole  (PROTONIX ) 40 MG tablet Take 1 tablet (40 mg total) by mouth daily. 90 tablet 1   potassium chloride  SA (KLOR-CON  M) 20 MEQ tablet Take 1 tablet (20 mEq total) by mouth daily.     Probiotic CAPS Take 1 capsule by mouth daily.     promethazine  (PHENERGAN ) 25 MG tablet Take 1 tablet (25 mg total) by mouth daily as  needed. 90 tablet 1   ticagrelor  (BRILINTA ) 90 MG TABS tablet Take 1 tablet (90 mg total) by mouth 2 (two) times daily. 180 tablet 1   torsemide  (DEMADEX ) 20 MG tablet Take 3 tablets (60 mg total) by mouth daily. 90 tablet 3   vitamin D3 (CHOLECALCIFEROL ) 25 MCG tablet Take 2 tablets (2,000 Units total) by mouth 2 (two) times daily. 120 tablet 2   doxepin  (SINEQUAN ) 150 MG capsule Take 1 capsule (150 mg total) by mouth at bedtime. (Patient not taking: Reported on 12/14/2023) 30 capsule 3   methocarbamol  (ROBAXIN ) 500 MG tablet Take 1 tablet (500 mg total) by mouth every 6 (six) hours as needed for muscle spasms. (Patient not taking: Reported on 12/14/2023) 28 tablet 0   orphenadrine  (NORFLEX ) 100 MG tablet Take 1 tablet (100 mg total) by mouth 2 (two) times daily. (Patient not taking: Reported on 12/14/2023) 60 tablet 0    Home: Home Living Family/patient expects to be discharged to:: Private residence Living Arrangements: Spouse/significant other, Parent Available Help at Discharge: Family, Available 24 hours/day Type of Home: House Home Access: Ramped entrance Home Layout: Able to live on main level with bedroom/bathroom Bathroom Shower/Tub: Health visitor: Standard Home Equipment: Agricultural consultant (2 wheels), Shower seat, Grab bars - tub/shower, Hand held shower head, Cane - single point, Wheelchair - manual, BSC/3in1, Rollator (4 wheels)  Functional History: Prior Function Prior Level of Function : Needs assist, History of Falls (last six months) Mobility Comments: Transfers to [facility], onto knee scooter. Not using W/c in house ADLs Comments: has been using BSC since ankle injury, needing assist for LB dressing since ankle  injury but typically manages ADls and light IADLs Functional Status:  Mobility: Bed Mobility Overal bed mobility: Needs Assistance Bed Mobility: Supine to Sit Supine to sit: Mod assist, HOB elevated, Used rails Sit to supine: Contact guard assist General  bed mobility comments: pt able to return to supine and with bed in [place] pt is able to pull herself up in the bed Transfers Overall transfer level: Needs assistance Equipment used: Rolling walker (2 wheels), Sliding board Transfers: Bed to chair/wheelchair/BSC Sit to Stand: +2 physical assistance, +2 safety/equipment, Mod assist Bed to/from chair/wheelchair/BSC transfer type:: Lateral/scoot transfer Stand pivot transfers: Total assist  Lateral/Scoot Transfers: +2 safety/equipment, With slide board, +2 physical assistance, Mod assist General transfer comment: with modAx2 and use of bed pad pt is able to scoot to her L where the drop arm of the recliner is, from chair to bed with modAx2 and use of bed pad to assist in elevating her hips, cues for hand placement throughout      ADL: ADL Overall ADL's : Needs assistance/impaired Eating/Feeding: Independent Grooming: Set up, Sitting Upper Body Bathing: Set up, Sitting Lower Body Bathing: Maximal assistance, +2 for safety/equipment, +2 for physical assistance, Sit to/from stand Upper Body Dressing : Set up, Sitting Lower Body Dressing: Maximal assistance, +2 for physical assistance, +2 for safety/equipment, Sit to/from stand Toileting- Clothing Manipulation and Hygiene: Total assistance, Bed level  Cognition: Cognition Orientation Level: Oriented X4 Cognition Arousal: Alert Behavior During Therapy: WFL for tasks assessed/performed  Blood pressure 129/61, pulse 68, temperature 98 F (36.7 C), temperature source Oral, resp. rate 16, height 5\' 5"  (1.651 m), weight 98 kg, SpO2 97%. Physical Exam  PE: Constitution: Appropriate appearance for age. No apparent distress.  Resp: No respiratory distress. No accessory muscle usage. on RA and CTAB Cardio: Well perfused appearance.  1+ right lower extremity edema. Abdomen: Nondistended. Nontender.  Positive bowel sounds, normoactive. Psych: Appropriate mood and affect. Neuro: AAOx4. No  apparent cognitive deficits   Neurologic Exam:   DTRs: Reflexes were 2+ in bilateral patella, biceps, BR and triceps. Babinsky: flexor responses right Hoffmans: negative b/l Sensory exam: revealed stocking glove pattern peripheral neuropathy extending similarly to the knees bilaterally and the elbows bilaterally Motor exam: strength 5/5 throughout bilateral upper extremities and bilateral lower extremities Coordination: Fine motor coordination was normal.      Results for orders placed or performed during the hospital encounter of 12/14/23 (from the past 24 hours)  Glucose, capillary     Status: Abnormal   Collection Time: 12/19/23 12:01 PM  Result Value Ref Range   Glucose-Capillary 221 (H) 70 - 99 mg/dL  Glucose, capillary     Status: Abnormal   Collection Time: 12/19/23  4:24 PM  Result Value Ref Range   Glucose-Capillary 199 (H) 70 - 99 mg/dL  Glucose, capillary     Status: Abnormal   Collection Time: 12/19/23  7:41 PM  Result Value Ref Range   Glucose-Capillary 165 (H) 70 - 99 mg/dL  Glucose, capillary     Status: Abnormal   Collection Time: 12/19/23 11:40 PM  Result Value Ref Range   Glucose-Capillary 42 (LL) 70 - 99 mg/dL   Comment 1 Document in Chart   Glucose, capillary     Status: Abnormal   Collection Time: 12/19/23 11:56 PM  Result Value Ref Range   Glucose-Capillary 58 (L) 70 - 99 mg/dL  Glucose, capillary     Status: Abnormal   Collection Time: 12/20/23 12:19 AM  Result Value Ref Range  Glucose-Capillary 66 (L) 70 - 99 mg/dL  Glucose, capillary     Status: None   Collection Time: 12/20/23 12:41 AM  Result Value Ref Range   Glucose-Capillary 74 70 - 99 mg/dL  Glucose, capillary     Status: Abnormal   Collection Time: 12/20/23  3:42 AM  Result Value Ref Range   Glucose-Capillary 131 (H) 70 - 99 mg/dL  Basic metabolic panel     Status: Abnormal   Collection Time: 12/20/23  5:09 AM  Result Value Ref Range   Sodium 132 (L) 135 - 145 mmol/L   Potassium 4.5  3.5 - 5.1 mmol/L   Chloride 96 (L) 98 - 111 mmol/L   CO2 27 22 - 32 mmol/L   Glucose, Bld 128 (H) 70 - 99 mg/dL   BUN 60 (H) 6 - 20 mg/dL   Creatinine, Ser 0.63 (H) 0.44 - 1.00 mg/dL   Calcium  8.2 (L) 8.9 - 10.3 mg/dL   GFR, Estimated 22 (L) >60 mL/min   Anion gap 9 5 - 15  CK     Status: Abnormal   Collection Time: 12/20/23  5:09 AM  Result Value Ref Range   Total CK 8 (L) 38 - 234 U/L  CBC     Status: Abnormal   Collection Time: 12/20/23  5:09 AM  Result Value Ref Range   WBC 14.5 (H) 4.0 - 10.5 K/uL   RBC 3.11 (L) 3.87 - 5.11 MIL/uL   Hemoglobin 8.4 (L) 12.0 - 15.0 g/dL   HCT 01.6 (L) 01.0 - 93.2 %   MCV 87.8 80.0 - 100.0 fL   MCH 27.0 26.0 - 34.0 pg   MCHC 30.8 30.0 - 36.0 g/dL   RDW 35.5 (H) 73.2 - 20.2 %   Platelets 297 150 - 400 K/uL   nRBC 0.0 0.0 - 0.2 %  Glucose, capillary     Status: Abnormal   Collection Time: 12/20/23  8:17 AM  Result Value Ref Range   Glucose-Capillary 158 (H) 70 - 99 mg/dL   *Note: Due to a large number of results and/or encounters for the requested time period, some results have not been displayed. A complete set of results can be found in Results Review.   No results found.  Assessment/Plan: Diagnosis: Left BKA Does the need for close, 24 hr/day medical supervision in concert with the patient's rehab needs make it unreasonable for this patient to be served in a less intensive setting? Yes Co-Morbidities requiring supervision/potential complications:  postop left ankle infection on IV vancomycin , Ancef  for E. coli pyelonephritis, acute on chronic HFrEF on diuretics, CKD stage IV, anemia, uncontrolled diabetes with A1c 10.2, severe depression and anxiety followed by psychiatry, and poor postop pain control managed with p.o. oxycodone  and IV Dilaudid , bowel incontinence, and urinary retention on Foley catheter. Due to bladder management, bowel management, safety, skin/wound care, disease management, medication administration, pain management, and  patient education, does the patient require 24 hr/day rehab nursing? Yes Does the patient require coordinated care of a physician, rehab nurse, therapy disciplines of PT, OT to address physical and functional deficits in the context of the above medical diagnosis(es)? Yes Addressing deficits in the following areas: balance, endurance, locomotion, strength, transferring, bowel/bladder control, bathing, dressing, feeding, grooming, and toileting Can the patient actively participate in an intensive therapy program of at least 3 hrs of therapy per day at least 5 days per week? Yes The potential for patient to make measurable gains while on inpatient rehab is good Anticipated  functional outcomes upon discharge from inpatient rehab are supervision  with PT, supervision with OT,  Estimated rehab length of stay to reach the above functional goals is: 5-7 days Anticipated discharge destination: Home Overall Rehab/Functional Prognosis: good  POST ACUTE RECOMMENDATIONS: This patient's condition is appropriate for continued rehabilitative care in the following setting: CIR Patient has agreed to participate in recommended program. Yes Note that insurance prior authorization may be required for reimbursement for recommended care.  Comment: Dana [name] is a 63 year old female with complex medical history presenting for left BKA after developing postop infection of her right ankle.  She lives in a home with a level entrance in a wheelchair ramp, with a basement that she does not use.  She is the primary caregiver for her mother, and has a husband who is available with physical assistance.  She is motivated to return home with intensive therapies, but will need to work on independent transfers and ambulation, hopefully with a walker given her bathroom will not accommodate a standard wheelchair.  She is an appropriate candidate for a short inpatient rehab stay to work on these goals.  I have personally performed a  face to face diagnostic evaluation of this patient. Additionally, I have examined the patient's medical record including any pertinent labs and radiographic images. If the physician assistant has documented in this note, I have reviewed and edited or otherwise concur with the physician assistant's documentation.  Thanks,  Bea Lime, DO 12/20/2023

## 2023-12-20 NOTE — Hospital Course (Addendum)
 HPI: Dana [name] is a pleasant 63 y.o. female with medical history significant for insulin -dependent diabetes mellitus, CKD stage IV, CAD, chronic hypoxic respiratory failure, OSA on CPAP, chronic HFmrEF, depression, anxiety, chronic pain, and left trimalleolar ankle fracture/dislocation status postoperative repair on 11/13/2023 who presented to the [facility] ED the night of 12/12/2023 with fevers, dysuria, and flank pain.   [facility] ED and Hospital Course: Upon arrival to the ED, patient is found to be febrile.  She had a leukocytosis 24,700.  CT of the abdomen and pelvis demonstrates bladder wall thickening with surrounding fat stranding.  Blood and urine cultures were collected, she was given IV fluids and broad-spectrum antibiotics, and was admitted to the hospitalist service.   Shortly after admission, it was noted that she had purulent drainage from left heel wound as well as open wounds at the medial aspect of the left ankle.  Orthopedic surgery at [facility], [place]) was consulted and recommended medical admission to [facility] where they will consult.   Blood cultures at [facility] are growing MRSA.  Significant Events: Admitted 12/14/2023 for surgical site infection left ankle with MRSA 12-18-2023 left BKA 12-23-2023 TEE negative for endocarditis  Admission Labs: Na 134, K 4.3, CO2 of 22, BUN 50, Scr 2.25, glu 137 T. Prot 6.5, alb 2.1, AST 11, ALT 11, alk phos 144  Admission Imaging Studies: CXR Prominent cardiac silhouette and vascular congestion  XR left ankle Postop changes. Disrupted ankle mortise. Displaced fractures. Soft tissue swelling CT renal stone No hydronephrosis. 2. Mild nonspecific bilateral perinephric fat stranding, mildly increased. Correlate clinically for infection. 3. Enlarged left ovary with surrounding inflammatory stranding and fluid, a new finding. Findings may be related to ovarian torsion or infection. Recommend further evaluation  with pelvic ultrasound. 4. Increasing body wall edema and presacral edema. 5. Trace bilateral pleural effusions. 6. Cardiomegaly. 7. Mild splenomegaly. 8. Aortic atherosclerosis Pelvic U/S Status post hysterectomy. 2. Nonvisualization of the ovaries bilaterally. If indicated, pelvic MRI examination with contrast may be helpful for further evaluation. 3. Trace simple appearing free fluid within the right adnexa, nonspecific  Significant Labs:   Significant Imaging Studies: 12-15-2023 LE U/S negative for DVT 12-15-2023 echo LVEF 40%, hx of mitral clip 12-16-2023 V/Q scan No evidence of acute pulmonary embolism on perfusion scintigraphy by PISAPED criteria. Mildly heterogeneous left perihilar perfusion, similar to previous examination  Antibiotic Therapy: Anti-infectives (From admission, onward)    Start     Dose/Rate Route Frequency Ordered Stop   12/20/23 1000  DAPTOmycin  (CUBICIN ) IVPB 700 mg/100mL premix  Status:  Discontinued        700 mg 200 mL/hr over 30 Minutes Intravenous Daily 12/19/23 1009 12/20/23 0821   12/20/23 1000  DAPTOmycin  (CUBICIN ) IVPB 700 mg/118mL premix        700 mg 200 mL/hr over 30 Minutes Intravenous Every 48 hours 12/20/23 0821     12/18/23 0600  vancomycin  (VANCOREADY) IVPB 1250 mg/250 mL  Status:  Discontinued        1,250 mg 166.7 mL/hr over 90 Minutes Intravenous Every 48 hours 12/17/23 1452 12/19/23 1009   12/18/23 0600  ceFAZolin  (ANCEF ) IVPB 2g/100 mL premix  Status:  Discontinued        2 g 200 mL/hr over 30 Minutes Intravenous On call to O.R. 12/17/23 2121 12/18/23 1254   12/16/23 1400  ceFAZolin  (ANCEF ) IVPB 1 g/50 mL premix        1 g 100 mL/hr over 30 Minutes Intravenous Every 8  hours 12/16/23 0937 12/20/23 0015   12/16/23 0600  Vancomycin  (VANCOCIN ) 1,250 mg in sodium chloride  0.9 % 250 mL IVPB  Status:  Discontinued        1,250 mg 166.7 mL/hr over 90 Minutes Intravenous Every 48 hours 12/14/23 1009 12/17/23 1452   12/14/23 1800   piperacillin -tazobactam (ZOSYN ) IVPB 3.375 g  Status:  Discontinued        3.375 g 12.5 mL/hr over 240 Minutes Intravenous Every 8 hours 12/14/23 1710 12/16/23 0940   12/14/23 0600  vancomycin  (VANCOCIN ) IVPB 1000 mg/200 mL premix  Status:  Discontinued        1,000 mg 200 mL/hr over 60 Minutes Intravenous Every 24 hours 12/14/23 0210 12/14/23 1009   12/14/23 0215  vancomycin  (VANCOCIN ) IVPB 1000 mg/200 mL premix  Status:  Discontinued        1,000 mg 200 mL/hr over 60 Minutes Intravenous  Once 12/14/23 0122 12/14/23 0130   12/14/23 0215  cefTRIAXone  (ROCEPHIN ) 2 g in sodium chloride  0.9 % 100 mL IVPB  Status:  Discontinued        2 g 200 mL/hr over 30 Minutes Intravenous Daily at bedtime 12/14/23 0155 12/14/23 1703       Procedures: 12-18-2023 left BKA 12-23-2023 TEE negative for endocarditis  Consultants: Infectious Disease Orthopedics Psych Cardiology

## 2023-12-20 NOTE — Assessment & Plan Note (Deleted)
 12-20-2023 psych has signed off.  12-21-2023 stable.  12-22-2023 stable.  12-23-2023 stable  12-24-2023 stable.

## 2023-12-20 NOTE — Assessment & Plan Note (Deleted)
 12-20-2023 stable Scr. 2.44  12-21-2023 stable. BMP in AM.  12-22-2023 stable. Scr 2.22 today.  12-23-2023 stable  12-24-2023 stable. Check BMP/Mg in AM now that she is on demadex  60 mg daily.

## 2023-12-20 NOTE — Care Management Important Message (Signed)
 Important Message  Patient Details  Name: Dana [name] MRN: [id number] Date of Birth: 07-25-1960   Important Message Given:  Yes - Medicare IM Will mail a copy of IM to the patient home address due to a contact precaution order in place     [facility] 12/20/2023, 3:39 PM

## 2023-12-20 NOTE — Plan of Care (Signed)
  Problem: Education: Goal: Knowledge of General Education information will improve Description: Including pain rating scale, medication(s)/side effects and non-pharmacologic comfort measures Outcome: Progressing   Problem: Health Behavior/Discharge Planning: Goal: Ability to manage health-related needs will improve Outcome: Progressing   Problem: Clinical Measurements: Goal: Ability to maintain clinical measurements within normal limits will improve Outcome: Progressing Goal: Will remain free from infection Outcome: Progressing Goal: Diagnostic test results will improve Outcome: Progressing   Problem: Activity: Goal: Risk for activity intolerance will decrease Outcome: Progressing   Problem: Activity: Goal: Risk for activity intolerance will decrease Outcome: Progressing

## 2023-12-20 NOTE — Inpatient Diabetes Management (Signed)
 Inpatient Diabetes Program Recommendations  AACE/ADA: New Consensus Statement on Inpatient Glycemic Control (2015)  Target Ranges:  Prepandial:   less than 140 mg/dL      Peak postprandial:   less than 180 mg/dL (1-2 hours)      Critically ill patients:  140 - 180 mg/dL   Lab Results  Component Value Date   GLUCAP 158 (H) 12/20/2023   HGBA1C 10.2 (H) 11/12/2023    Latest Reference Range & Units 12/19/23 12:01 12/19/23 16:24 12/19/23 19:41 12/19/23 23:40 12/19/23 23:56 12/20/23 00:19 12/20/23 00:41 12/20/23 03:42 12/20/23 08:17  Glucose-Capillary 70 - 99 mg/dL 161 (H) Novolog  7 units 199 (H) Novolog  6 @ 1830 165 (H) Novolog  2 units @ 2108 42 (LL) 58 (L) 66 (L) 74 131 (H) 158 (H) Novolog  2 units  (LL): Data is critically low (H): Data is abnormally high (L): Data is abnormally low  Diabetes history: DM2 Outpatient Diabetes medications: Lantus  30 every day,  Novolog  2-10 units TID and FSL2  Current orders for Inpatient glycemic control: Novolog  0-9 units q 4 hrs.  Inpatient Diabetes Program Recommendations:   Patient had hypoglycemia post Novolog  meal coverage + correction. Please consider: -Restart Lantus  10 units daily -Decrease Novolog  correction to 0-6 units q 4 hrs. While NPO  Thank you, Teejay Meader E. Sary Bogie, RN, MSN, CDCES  Diabetes Coordinator Inpatient Glycemic Control Team Team Pager [phone] (8am-5pm) 12/20/2023 9:58 AM'

## 2023-12-20 NOTE — Subjective & Objective (Addendum)
 Pt seen and examined.  Pt had TEE today. Negative for endocarditis. Back on diabetic diet. Awaiting CIR

## 2023-12-20 NOTE — Progress Notes (Signed)
 Rounding Note   Patient Name: Dana [name] Date of Encounter: 12/20/2023  [facility] Cardiologist: Jules Oar, MD   Subjective  Pt states she just had bad leg pain, now resolving.   Scheduled Meds:  allopurinol   50 mg Oral Daily   aspirin  EC  81 mg Oral Daily   carvedilol   6.25 mg Oral BID WC   Chlorhexidine  Gluconate Cloth  6 each Topical Daily   escitalopram  10 mg Oral Daily   furosemide   60 mg Intravenous BID   gabapentin   300 mg Oral TID   heparin  injection (subcutaneous)  5,000 Units Subcutaneous Q8H   insulin  aspart  0-9 Units Subcutaneous Q4H   latanoprost   1 drop Both Eyes QHS   pantoprazole   40 mg Oral Daily   polyethylene glycol  17 g Oral Daily   senna  2 tablet Oral QHS   sodium chloride  flush  3 mL Intravenous Q12H   ticagrelor   90 mg Oral BID   Continuous Infusions:  DAPTOmycin     PRN Meds: acetaminophen  **OR** acetaminophen , HYDROmorphone  (DILAUDID ) injection, LORazepam , oxyCODONE , prochlorperazine , sodium chloride  flush   Vital Signs  Vitals:   12/19/23 1943 12/20/23 0032 12/20/23 0345 12/20/23 0800  BP: (!) 120/50 (!) 139/59 (!) 121/55 129/61  Pulse: 61 (!) 53 62 68  Resp: 18 16 18 16   Temp: 98.1 F (36.7 C) 98.3 F (36.8 C) 98.4 F (36.9 C) 98 F (36.7 C)  TempSrc: Oral Oral Oral Oral  SpO2: 98% 99% 99% 97%  Weight:      Height:        Intake/Output Summary (Last 24 hours) at 12/20/2023 0843 Last data filed at 12/20/2023 0347 Gross per 24 hour  Intake 340 ml  Output 1250 ml  Net -910 ml      12/14/2023    2:00 AM 12/14/2023    1:00 AM 11/12/2023    4:16 AM  Last 3 Weights  Weight (lbs) 216 lb 0.8 oz 227 lb 11.8 oz 214 lb 0.4 oz  Weight (kg) 98 kg 103.3 kg 97.08 kg      Telemetry SR HR 70s - Personally Reviewed   Physical Exam  GEN: No acute distress.   Neck: minimal JVD Cardiac: RRR, no murmurs, rubs, or gallops.  Respiratory: Clear to auscultation bilaterally. GI: Soft, nontender, non-distended  MS: RLE  with mild edema, left leg with post surgical dressing in place Neuro:  Nonfocal  Psych: Normal affect   Labs High Sensitivity Troponin:  No results for input(s): "TROPONINIHS" in the last 720 hours.   Chemistry Recent Labs  Lab 12/14/23 0214 12/15/23 0253 12/18/23 0351 12/19/23 0433 12/20/23 0509  NA 134*   < > 135 133* 132*  K 4.3   < > 4.5 4.6 4.5  CL 98   < > 99 97* 96*  CO2 22   < > 25 25 27   GLUCOSE 137*   < > 80 238* 128*  BUN 50*   < > 57* 59* 60*  CREATININE 2.25*   < > 2.05* 2.25* 2.44*  CALCIUM  8.4*   < > 8.8* 8.5* 8.2*  MG 2.1  --   --   --   --   PROT 6.5  --   --   --   --   ALBUMIN  2.1*  --   --   --   --   AST 11*  --   --   --   --   ALT 11  --   --   --   --  ALKPHOS 144*  --   --   --   --   BILITOT 0.6  --   --   --   --   GFRNONAA 24*   < > 27* 24* 22*  ANIONGAP 14   < > 11 11 9    < > = values in this interval not displayed.    Lipids No results for input(s): "CHOL", "TRIG", "HDL", "LABVLDL", "LDLCALC", "CHOLHDL" in the last 168 hours.  Hematology Recent Labs  Lab 12/18/23 0351 12/19/23 0433 12/20/23 0509  WBC 13.4* 17.3* 14.5*  RBC 3.47* 3.51* 3.11*  HGB 9.5* 9.3* 8.4*  HCT 30.4* 30.7* 27.3*  MCV 87.6 87.5 87.8  MCH 27.4 26.5 27.0  MCHC 31.3 30.3 30.8  RDW 16.7* 16.4* 16.7*  PLT 280 316 297   Thyroid  No results for input(s): "TSH", "FREET4" in the last 168 hours.  BNPNo results for input(s): "BNP", "PROBNP" in the last 168 hours.  DDimer No results for input(s): "DDIMER" in the last 168 hours.   Radiology  No results found.  Cardiac Studies  Echo 12/15/23: 1. Left ventricular ejection fraction, by estimation, is 40 to 45%. The  left ventricle has mildly decreased function. The left ventricle  demonstrates global hypokinesis. There is mild left ventricular  hypertrophy. Left ventricular diastolic function  could not be evaluated. There is the interventricular septum is flattened  in systole and diastole, consistent with right  ventricular pressure and  volume overload.   2. Right ventricular systolic function is low normal. The right  ventricular size is enlarged. There is severely elevated pulmonary artery  systolic pressure. The estimated right ventricular systolic pressure is  65.7 mmHg.   3. Left atrial size was mildly dilated.   4. S/P mitral clip, mild to moderate MR, no valvular stenosis (MG 5 mmHg,  HR 72bpm). . The mean mitral valve gradient is 5.0 mmHg with average heart  rate of 72 bpm.   5. Tricuspid valve regurgitation is moderate.   6. The aortic valve is tricuspid. Aortic valve regurgitation is not  visualized. Aortic valve sclerosis is present, with no evidence of aortic  valve stenosis.   7. Pulmonic valve regurgitation is moderate.   8. The inferior vena cava is dilated in size with <50% respiratory  variability, suggesting right atrial pressure of 15 mmHg.   Patient Profile   63 y.o. female with a history of CAD with multiple prior PCIs (last intervention was scoring balloon 07/2023), chronic systolic and diastolic heart failure, paroxysmal atrial fibrillation s/p ablation 2017 (no longer on anticoagulation), mitraclip 2022, PEA arrest 2022, PAD s/p L toe amputations, chronic kidney disease stage 4, insulin  dependent diabetes.   Recently she presented to [facility] for sepsis. She was found to have bilateral pyelonephritis and L ankle postoperative infection. Then found to have MRSA bacteremia. She was transferred to [facility] for further management.   Currently: MRSA bacteremia, Sepsis, Bilateral pyelonephritis, post surgical wound infection: management per primary team, ID, and surgery. Plan for TEE when euvolemic.    Assessment & Plan   Acute on chronic systolic and diastolic heart failure RV failure CKD IV - currently diuresing, but complicated by renal function - receiving IV lasix  60 mg BID - I&Os incomplete, daily weights not entered - sCr 2.44 (2.25) - K 4.5 - given rising  creatinine, may need to hold second dose of IV lasix  today - plan for TEE on Monday - continue coreg  6.25 mg BID   CAD with multiple prior PCI -  last PCI with scoring balloon 07/2023 - brilinta  held for surgery, has been restarted   Hx of mitraclip 2022 - mild to moderate on echo    For questions or updates, please contact [facility] Please consult [email or web address] for contact info under     Signed, Lamond Pilot, PA  12/20/2023, 8:43 AM

## 2023-12-20 NOTE — Assessment & Plan Note (Addendum)
 12-20-2023 stable.  12-21-2023 stable.  12-22-2023 stable. On RA.  12-23-2023 on RA.  12-24-2023 on RA.

## 2023-12-20 NOTE — Assessment & Plan Note (Deleted)
 12-20-2023 cardiology has decreased IV lasix  to 60 mg daily.  12-21-2023 stable. Remains on 60 mg IV lasix  per day.  12-22-2023 stable. Cards has kept her on 60 mg IV lasix  per day.  12-23-2023 cards has changed to po demadex  60 mg daily. Remains on coreg  9.375 mg bid  12-24-2023 stable on demadex  60 mg daily, coreg  9.375 mgm bid. Check BMP/Mg in AM. Pt able to lie flat.

## 2023-12-20 NOTE — PMR Pre-admission (Signed)
 PMR Admission Coordinator Pre-Admission Assessment  Patient: Dana [name] is an 63 y.o., female MRN: [id number] DOB: Sep 21, 1960 Height: 5' 5 (165.1 cm) Weight: 98 kg (at [facility])              Insurance Information HMO:     PPO: yes     PCP:      IPA:      80/20:      OTHER:  PRIMARY: [name]      Policy#: [id number]      Subscriber: pt CM Name: Tex Filbert      Phone#: [phone]     Fax#: epic access Pre-Cert#: [id number] auth for CIR provided by Tex Filbert with HTA with updates due weekly.  They have epic acces      Employer:  Benefits:  Phone #: [phone]     Name:  Eff. Date: 07/17/23     Deduct: $0      Out of Pocket Max: $3500 (met [id number])      Life Max: n/a  CIR: $225/day for days 1-6      SNF: 20 full days Outpatient:      Co-Pay: $15/visit Home Health: 80%      Co-Pay: 20% DME: 80%     Co-Pay: 20% Providers:  SECONDARY:       Policy#:       Phone#:   Artist:       Phone#:   The Engineer, materials Information Summary" for patients in Inpatient Rehabilitation Facilities with attached "Privacy Act Statement-Health Care Records" was provided and verbally reviewed with: Patient and Family  Emergency Contact Information Contact Information     Name Relation Home Work Mobile   Salm,[name] Spouse [phone]  [phone]      Other Contacts     Name Relation Home Work Mobile   Willard Daughter   [phone]      Current Medical History  Patient Admitting Diagnosis: BKA   History of Present Illness: Pt is a 63 y/o female with PMH of DM, CKD stage IV, CAD, chronic hypoxic respiratory failure, OSA, HFmrEF, anxiety, depression, chronic pain, and left trimalleolar fx in April 2025 s/p ORIF who was admitted to [facility] on 12/14/23 with fevers, dysuria, and flank pain.  In ED pt febrile with leukocytosis of 24.7.  CT abdomen/pelvis revealed bladder wall thickening with surround fat stranding.  She had purulent drainage from left heel wound  as well as open wounds in the medial aspect of the left ankle.  Blood cultures demonstrated MRSA bacteremia with e-coli.  Ortho was consulted and recommended further imaging which demonstrated charcot collapse with failed internal fixation of fracture with cellulitis and ulceration of left heel in conjunction with PVD and DM.  Recommendations were for BKA which pt underwent per Dr. Julio Ohm on 12/18/23.  Post operative course management of pain, DM.  Cardiology planning TEE to r/o endocarditis.  Therapy evaluations completed and pt was recommended for CIR to return to intermittent mod I level in preparation for d/c home with family support.    Glasgow Coma Scale Score: 15  Patient's medical record from [facility] has been reviewed by the rehabilitation admission coordinator and physician.  Past Medical History  Past Medical History:  Diagnosis Date   Acquired absence of left great toe (HCC)    Acquired absence of other left toe(s) (HCC)    ACS (acute coronary syndrome) (HCC)    Acute bronchitis due to COVID-19 virus 03/03/2023   Acute on chronic  combined systolic and diastolic CHF (congestive heart failure) (HCC) 09/16/2023   Acute respiratory failure with hypoxia (HCC) 07/20/2023   Anemia    Anoxic brain injury (HCC)    Anxiety    CAD (coronary artery disease)    DES mLAD 04/07/15; DES dRCA & DES pPDA 08/30/16; DES mCX & PTCA OM1 09/29/20   Chronic diastolic (congestive) heart failure (HCC)    Chronic pain    Chronic systolic (congestive) heart failure (HCC)    CKD (chronic kidney disease)    Contrast dye induced nephropathy, possible 09/03/2020   COPD (chronic obstructive pulmonary disease) (HCC)    COVID-19 virus infection 03/03/2023   Demand ischemia (HCC)    Diabetes (HCC)type 1    Diastolic CHF (HCC)    Dyspnea    Encounter for screening mammogram for malignant neoplasm of breast 09/16/2023   Family history of breast cancer 10/23/2021   Gastro-esophageal reflux disease without  esophagitis    Hyperlipidemia    Hypertension    Hypertensive heart disease with heart failure (HCC)    Hypoxemia    Idiopathic gout, multiple sites    Leukocytosis 03/29/2022   Localized edema    Low back pain    Mitral regurgitation    Mitral regurgitation    Mixed hyperlipidemia    Mixed incontinence    Morbid (severe) obesity due to excess calories (HCC)    Neuromuscular disorder (HCC)    Neuropathy    Non-ST elevation (NSTEMI) myocardial infarction (HCC)    08/29/20, 09/29/20   OSA (obstructive sleep apnea) 09/03/2020   Other specified hypothyroidism    PAF (paroxysmal atrial fibrillation) (HCC)    s/p pulmonary vein isolation by cryoablation 10/04/15 Dr. Jearldine Mina in [facility]   PEA (Pulseless electrical activity) [facility])    ~ 01/13/21 at [facility] during admission DKA, volume overload, possible CAP, hypoxia s/p ACLS with ROSC ~ 10-15   Pneumonia    Primary insomnia    Rheumatoid arthritis (HCC)    Stroke (HCC)    Thyroid  disease    Type 1 diabetes mellitus (HCC)    URI with cough and congestion 08/22/2023   Vitamin D  deficiency     Has the patient had major surgery during 100 days prior to admission? Yes  Family History  family history includes Bipolar disorder in an other family member; Breast cancer (age of onset: 76) in her mother; Cancer in an other family member; Coronary artery disease in her mother; Depression in an other family member; Diabetes in her father; Diabetes type II in her mother; Hyperlipidemia in her mother; Hypertension in her father and mother; Lung cancer (age of onset: 52) in her mother; Mental illness in her sister; Schizophrenia in an other family member.   Current Medications   Current Facility-Administered Medications:    acetaminophen  (TYLENOL ) tablet 650 mg, 650 mg, Oral, Q6H PRN, 650 mg at 12/17/23 2341 **OR** acetaminophen  (TYLENOL ) suppository 650 mg, 650 mg, Rectal, Q6H PRN, Opyd, Santana Cue, MD   allopurinol  (ZYLOPRIM ) tablet 50 mg, 50  mg, Oral, Daily, Opyd, Timothy S, MD, 50 mg at 12/20/23 1043   aspirin  EC tablet 81 mg, 81 mg, Oral, Daily, Elgergawy, Dawood S, MD, 81 mg at 12/20/23 1043   carvedilol  (COREG ) tablet 6.25 mg, 6.25 mg, Oral, BID WC, Elgergawy, Dawood S, MD, 6.25 mg at 12/20/23 0981   Chlorhexidine  Gluconate Cloth 2 % PADS 6 each, 6 each, Topical, Daily, Elgergawy, Ardia Kraft, MD, 6 each at 12/20/23 1041   DAPTOmycin  (CUBICIN ) IVPB  700 mg/100mL premix, 700 mg, Intravenous, Q48H, Unk Garb, DO   escitalopram  (LEXAPRO ) tablet 10 mg, 10 mg, Oral, Daily, Elgergawy, Dawood S, MD, 10 mg at 12/20/23 1042   [START ON 12/21/2023] furosemide  (LASIX ) injection 60 mg, 60 mg, Intravenous, Daily, Duke, Warren Haber, PA   gabapentin  (NEURONTIN ) capsule 300 mg, 300 mg, Oral, TID, Opyd, Timothy S, MD, 300 mg at 12/20/23 1042   heparin  injection 5,000 Units, 5,000 Units, Subcutaneous, Q8H, Elgergawy, Dawood S, MD, 5,000 Units at 12/20/23 1610   HYDROmorphone  (DILAUDID ) injection 0.5 mg, 0.5 mg, Intravenous, Q4H PRN, Elgergawy, Ardia Kraft, MD, 0.5 mg at 12/20/23 9604   insulin  aspart (novoLOG ) injection 0-6 Units, 0-6 Units, Subcutaneous, Q4H, Unk Garb, DO   latanoprost  (XALATAN ) 0.005 % ophthalmic solution 1 drop, 1 drop, Both Eyes, QHS, Elgergawy, Ardia Kraft, MD, 1 drop at 12/19/23 2342   LORazepam  (ATIVAN ) tablet 0.5 mg, 0.5 mg, Oral, QID PRN, Elgergawy, Dawood S, MD, 0.5 mg at 12/19/23 2046   oxyCODONE  (Oxy IR/ROXICODONE ) immediate release tablet 5 mg, 5 mg, Oral, Q4H PRN, Opyd, Timothy S, MD, 5 mg at 12/20/23 0654   pantoprazole  (PROTONIX ) EC tablet 40 mg, 40 mg, Oral, Daily, Opyd, Timothy S, MD, 40 mg at 12/20/23 1042   polyethylene glycol (MIRALAX  / GLYCOLAX ) packet 17 g, 17 g, Oral, Daily, Ghimire, Shanker M, MD   prochlorperazine  (COMPAZINE ) injection 5 mg, 5 mg, Intravenous, Q6H PRN, Opyd, Timothy S, MD, 5 mg at 12/18/23 5409   senna (SENOKOT) tablet 17.2 mg, 2 tablet, Oral, QHS, Ghimire, Shanker M, MD, 17.2 mg at 12/19/23 2047    sodium chloride  flush (NS) 0.9 % injection 10-40 mL, 10-40 mL, Intracatheter, PRN, Elgergawy, Dawood S, MD   sodium chloride  flush (NS) 0.9 % injection 3 mL, 3 mL, Intravenous, Q12H, Opyd, Timothy S, MD, 3 mL at 12/20/23 1038   ticagrelor  (BRILINTA ) tablet 90 mg, 90 mg, Oral, BID, Ghimire, Estil Heman, MD, 90 mg at 12/20/23 1042  Patients Current Diet:  Diet Order             Diet NPO time specified Except for: Sips with Meds  Diet effective midnight           Diet heart healthy/carb modified Fluid consistency: Thin  Diet effective now                   Precautions / Restrictions Precautions Precautions: Fall Precaution/Restrictions Comments: LLE would vac, L limb protector Restrictions Weight Bearing Restrictions Per Provider Order: Yes LLE Weight Bearing Per Provider Order: Non weight bearing Other Position/Activity Restrictions: pre-emptive for post-op BKA, no formal order yet.   Has the patient had 2 or more falls or a fall with injury in the past year?Yes  Prior Activity Level Limited Community (1-2x/wk): since trimal fx has been more limited in mobility, still mod I, driving, caregiver for mom but only for cooking/cleaning Community (5-7x/wk): independent prior to fall with trimal fx  Prior Functional Level Prior Function Prior Level of Function : Needs assist, History of Falls (last six months) Mobility Comments: Transfers to [facility], onto knee scooter. Not using W/c in house ADLs Comments: has been using BSC since ankle injury, needing assist for LB dressing since ankle injury but typically manages ADls and light IADLs  Self Care: Did the patient need help bathing, dressing, using the toilet or eating?  Needed some help  Indoor Mobility: Did the patient need assistance with walking from room to room (with or without device)? Independent  Stairs:  Did the patient need assistance with internal or external stairs (with or without device)? Independent  Functional  Cognition: Did the patient need help planning regular tasks such as shopping or remembering to take medications? Independent  Patient Information Are you of Hispanic, Latino/a,or Spanish origin?: A. No, not of Hispanic, Latino/a, or Spanish origin What is your race?: A. White Do you need or want an interpreter to communicate with a doctor or health care staff?: 0. No  Patient's Response To:  Health Literacy and Transportation Is the patient able to respond to health literacy and transportation needs?: Yes Health Literacy - How often do you need to have someone help you when you read instructions, pamphlets, or other written material from your doctor or pharmacy?: Never In the past 12 months, has lack of transportation kept you from medical appointments or from getting medications?: No In the past 12 months, has lack of transportation kept you from meetings, work, or from getting things needed for daily living?: No  Home Assistive Devices / Equipment Home Equipment: Agricultural consultant (2 wheels), Shower seat, Grab bars - tub/shower, Higher education careers adviser held shower head, Cane - single point, Wheelchair - manual, BSC/3in1, Rollator (4 wheels)  Prior Device Use: Indicate devices/aids used by the patient prior to current illness, exacerbation or injury? Knee scooter  Current Functional Level Cognition  Orientation Level: Oriented X4    Extremity Assessment (includes Sensation/Coordination)  Upper Extremity Assessment: Defer to OT evaluation  Lower Extremity Assessment: LLE deficits/detail, Generalized weakness LLE Deficits / Details: Lt BKA, vac in place, shrinker and ampushield on LLE: Unable to fully assess due to pain    ADLs  Overall ADL's : Needs assistance/impaired Eating/Feeding: Independent Grooming: Set up, Sitting Upper Body Bathing: Set up, Sitting Lower Body Bathing: Maximal assistance, +2 for safety/equipment, +2 for physical assistance, Sit to/from stand Upper Body Dressing : Set up,  Sitting Lower Body Dressing: Maximal assistance, +2 for physical assistance, +2 for safety/equipment, Sit to/from stand Toileting- Clothing Manipulation and Hygiene: Total assistance, Bed level    Mobility  Overal bed mobility: Needs Assistance Bed Mobility: Supine to Sit Supine to sit: Mod assist, HOB elevated, Used rails Sit to supine: Contact guard assist General bed mobility comments: pt able to return to supine and with bed in [place] pt is able to pull herself up in the bed    Transfers  Overall transfer level: Needs assistance Equipment used: Rolling walker (2 wheels), Sliding board Transfers: Bed to chair/wheelchair/BSC Sit to Stand: +2 physical assistance, +2 safety/equipment, Mod assist Bed to/from chair/wheelchair/BSC transfer type:: Lateral/scoot transfer Stand pivot transfers: Total assist  Lateral/Scoot Transfers: +2 safety/equipment, With slide board, +2 physical assistance, Mod assist General transfer comment: with modAx2 and use of bed pad pt is able to scoot to her L where the drop arm of the recliner is, from chair to bed with modAx2 and use of bed pad to assist in elevating her hips, cues for hand placement throughout    Ambulation / Gait / Stairs / Wheelchair Mobility       Posture / Balance Balance Overall balance assessment: Needs assistance Sitting-balance support: No upper extremity supported, Feet supported Sitting balance-Leahy Scale: Good Standing balance support: Bilateral upper extremity supported, Reliant on assistive device for balance Standing balance-Leahy Scale: Poor    Special needs/care consideration CPAP, Wound Vac LLE, Skin L BKA incision, and Diabetic management yes     Previous Home Environment (from acute therapy documentation) Living Arrangements: Spouse/significant other, Parent Available Help at Discharge: Family,  Available 24 hours/day Type of Home: House Home Layout: Able to live on main level with bedroom/bathroom Home  Access: Ramped entrance Bathroom Shower/Tub: Health visitor: Standard Home Care Services: Yes Type of Home Care Services: Home OT, Home RN, Home PT Home Care Agency (if known): [facility]  Discharge Living Setting Plans for Discharge Living Setting: Patient's home, Lives with (comment) (mom and spouse) Type of Home at Discharge: House Discharge Home Layout: Able to live on main level with bedroom/bathroom Discharge Home Access: Ramped entrance Discharge Bathroom Shower/Tub: Walk-in shower Discharge Bathroom Toilet: Standard Discharge Bathroom Accessibility: Yes How Accessible: Accessible via walker Does the patient have any problems obtaining your medications?: No  Social/Family/Support Systems Anticipated Caregiver: pt's spouse and mom Anticipated Caregiver's Contact Information: Wandalee Gust  [phone] Ability/Limitations of Caregiver: Wandalee Gust works 2-3 days/week 12 hour shifts.  Mom is home during those times and is able to provide supervision, light assist for ADLs Caregiver Availability: 24/7 Discharge Plan Discussed with Primary Caregiver: Yes Is Caregiver In Agreement with Plan?: Yes Does Caregiver/Family have Issues with Lodging/Transportation while Pt is in Rehab?: No   Goals Patient/Family Goal for Rehab: PT/OT mod I w/c level, supervision ambulatory with RW, SLP n/a Expected length of stay: 10-14 days Additional Information: Discharge plan: to pt's home with her mom and her spouse.  Spouse works 2-3x/week 12 hr shifts, mom is home 24/7.  Pt is not a caregiver for her mother outside of light meal prep and cleaning.  Mom is independent and can provide supervision and light assist for some ADLs if needed Pt/Family Agrees to Admission and willing to participate: Yes Program Orientation Provided & Reviewed with Pt/Caregiver Including Roles  & Responsibilities: Yes   Decrease burden of Care through IP rehab admission: no    Possible need for SNF placement upon  discharge: No.  Plan for discharge to pt's home which she shares with her spouse and her mom.  Mom is home 24/7 and can provide supervision, limited assist with some ADLs. Spouse works 2-3x/week for 12 hour shifts.    Patient Condition: This patient's medical and functional status has changed since the consult dated: 12/20/23 in which the Rehabilitation Physician determined and documented that the patient's condition is appropriate for intensive rehabilitative care in an inpatient rehabilitation facility. See History of Present Illness (above) for medical update. Functional changes are: pt min assist overall for transfers, though did have a hard day with therapy on 6/11 and requiring significantly increased assist for standing transfers. Patient's medical and functional status update has been discussed with the Rehabilitation physician and patient remains appropriate for inpatient rehabilitation. Will admit to inpatient rehab today.  Preadmission Screen Completed By:  Mickey Alar, PT, 12/20/2023 11:12 AM ______________________________________________________________________   Discussed status with Dr. Rachel Budds on 12/27/23 at  9:57 AM  and received approval for admission today.  Admission Coordinator:  Caitlin E Warren, PT, DPT time 9:57 AM /Date06/13/25

## 2023-12-20 NOTE — Assessment & Plan Note (Deleted)
 12-20-2023 stable.  12-21-2023 stable.  12-22-2023 stable.  12-23-2023 stable.  12-24-2023 stable.

## 2023-12-20 NOTE — Progress Notes (Signed)
 Inpatient Rehab Admissions:  Following for completion of TEE. Then will submit for auth with [employer]TA.   Loye Rumble, PT, DPT

## 2023-12-20 NOTE — Plan of Care (Signed)
  Problem: Education: Goal: Knowledge of General Education information will improve Description: Including pain rating scale, medication(s)/side effects and non-pharmacologic comfort measures Outcome: Progressing   Problem: Health Behavior/Discharge Planning: Goal: Ability to manage health-related needs will improve Outcome: Progressing   Problem: Clinical Measurements: Goal: Ability to maintain clinical measurements within normal limits will improve Outcome: Progressing Goal: Will remain free from infection Outcome: Progressing Goal: Diagnostic test results will improve Outcome: Progressing   Problem: Activity: Goal: Risk for activity intolerance will decrease Outcome: Progressing   Problem: Nutrition: Goal: Adequate nutrition will be maintained Outcome: Progressing   Problem: Coping: Goal: Level of anxiety will decrease Outcome: Progressing   Problem: Elimination: Goal: Will not experience complications related to bowel motility Outcome: Progressing Goal: Will not experience complications related to urinary retention Outcome: Progressing   Problem: Pain Managment: Goal: General experience of comfort will improve and/or be controlled Outcome: Progressing   Problem: Safety: Goal: Ability to remain free from injury will improve Outcome: Progressing   Problem: Skin Integrity: Goal: Risk for impaired skin integrity will decrease Outcome: Progressing   Problem: Education: Goal: Ability to describe self-care measures that may prevent or decrease complications (Diabetes Survival Skills Education) will improve Outcome: Progressing Goal: Individualized Educational Video(s) Outcome: Progressing   Problem: Coping: Goal: Ability to adjust to condition or change in health will improve Outcome: Progressing   Problem: Fluid Volume: Goal: Ability to maintain a balanced intake and output will improve Outcome: Progressing   Problem: Health Behavior/Discharge  Planning: Goal: Ability to identify and utilize available resources and services will improve Outcome: Progressing Goal: Ability to manage health-related needs will improve Outcome: Progressing   Problem: Metabolic: Goal: Ability to maintain appropriate glucose levels will improve Outcome: Progressing   Problem: Nutritional: Goal: Maintenance of adequate nutrition will improve Outcome: Progressing Goal: Progress toward achieving an optimal weight will improve Outcome: Progressing   Problem: Skin Integrity: Goal: Risk for impaired skin integrity will decrease Outcome: Progressing   Problem: Tissue Perfusion: Goal: Adequacy of tissue perfusion will improve Outcome: Progressing   Problem: Fluid Volume: Goal: Hemodynamic stability will improve Outcome: Progressing   Problem: Clinical Measurements: Goal: Diagnostic test results will improve Outcome: Progressing Goal: Signs and symptoms of infection will decrease Outcome: Progressing   Problem: Respiratory: Goal: Ability to maintain adequate ventilation will improve Outcome: Progressing

## 2023-12-20 NOTE — Progress Notes (Signed)
 Pharmacy Antibiotic Note  Dana [name] is a 63 y.o. female admitted on 12/14/2023 with MRSA bacteremia seeded from L-heel wound. Also noted to have concern for UTI/pyelo.  Pharmacy has been consulted for daptomycin  The patient has underlying CKD with SCr fluctuations noted between 2-2.5, on the lower end of that range today at 2.05.   Changing vancomycin  to daptomycin for lower nephrotoxicity risk.  Baseline CK = 8  Plan: - Change to daptomycin 700mg  IV q48h - Will continue to follow renal function, culture results, LOT, and antibiotic plans   Height: 5\' 5"  (165.1 cm) Weight: 98 kg (216 lb 0.8 oz) (at [facility]) IBW/kg (Calculated) : 57  Temp (24hrs), Avg:98 F (36.7 C), Min:97.7 F (36.5 C), Max:98.4 F (36.9 C)  Recent Labs  Lab 12/14/23 0214 12/15/23 0253 12/15/23 1359 12/16/23 0315 12/17/23 0409 12/18/23 0351 12/19/23 0433 12/20/23 0509  WBC 19.6*   < >  --  11.0* 14.9* 13.4* 17.3* 14.5*  CREATININE 2.25*   < >  --  2.16* 2.06* 2.05* 2.25* 2.44*  LATICACIDVEN 1.2  --   --   --   --   --   --   --   VANCORANDOM  --   --  21  --   --  21  --   --    < > = values in this interval not displayed.    Estimated Creatinine Clearance: 28.4 mL/min (A) (by C-G formula based on SCr of 2.44 mg/dL (H)).      Antimicrobials this admission: Rocephin  5/31 x 1 Vancomycin  5/31 >>6/5 Zosyn 5/31 >> 6/2 Cefazolin  6/2 >>6/5 Daptomycin 6/6>>  Dose adjustments this admission: 6/1 VR 21 mcg/ml 6/4 VR 21 mcg/ml  Microbiology results: 5/29 OSH Theodis Fiscal) UCx >> E.coli (pan-sensitive) 5/30 OSH [facility] Fiscal) BCx >> MRSA 5/31 UCx >> <10k insignificant 5/31 BCx >> ngx4d 6/4 MRSA PCR >> neg  Thank you for allowing pharmacy to be a part of this patient's care.  Skeet Duke, PharmD, BCIDP Clinical Pharmacist Phone [phone]  12/20/2023 10:41 AM   **Pharmacist phone directory can now be found on [email or web address] ([email or web address]).  Listed under [facility].

## 2023-12-21 DIAGNOSIS — Z89512 Acquired absence of left leg below knee: Secondary | ICD-10-CM | POA: Diagnosis not present

## 2023-12-21 DIAGNOSIS — E1022 Type 1 diabetes mellitus with diabetic chronic kidney disease: Secondary | ICD-10-CM | POA: Diagnosis not present

## 2023-12-21 DIAGNOSIS — R7881 Bacteremia: Secondary | ICD-10-CM | POA: Diagnosis not present

## 2023-12-21 DIAGNOSIS — T8149XA Infection following a procedure, other surgical site, initial encounter: Secondary | ICD-10-CM | POA: Diagnosis not present

## 2023-12-21 DIAGNOSIS — I5043 Acute on chronic combined systolic (congestive) and diastolic (congestive) heart failure: Secondary | ICD-10-CM | POA: Diagnosis not present

## 2023-12-21 LAB — GLUCOSE, CAPILLARY
Glucose-Capillary: 135 mg/dL — ABNORMAL HIGH (ref 70–99)
Glucose-Capillary: 157 mg/dL — ABNORMAL HIGH (ref 70–99)
Glucose-Capillary: 196 mg/dL — ABNORMAL HIGH (ref 70–99)
Glucose-Capillary: 235 mg/dL — ABNORMAL HIGH (ref 70–99)

## 2023-12-21 LAB — BASIC METABOLIC PANEL WITH GFR
Anion gap: 9 (ref 5–15)
BUN: 63 mg/dL — ABNORMAL HIGH (ref 6–20)
CO2: 24 mmol/L (ref 22–32)
Calcium: 8.2 mg/dL — ABNORMAL LOW (ref 8.9–10.3)
Chloride: 101 mmol/L (ref 98–111)
Creatinine, Ser: 2.32 mg/dL — ABNORMAL HIGH (ref 0.44–1.00)
GFR, Estimated: 23 mL/min — ABNORMAL LOW (ref >60)
Glucose, Bld: 138 mg/dL — ABNORMAL HIGH (ref 70–99)
Potassium: 4.6 mmol/L (ref 3.5–5.1)
Sodium: 134 mmol/L — ABNORMAL LOW (ref 135–145)

## 2023-12-21 MED ORDER — OXYCODONE HCL 5 MG PO TABS
5.0000 mg | ORAL_TABLET | ORAL | Status: DC | PRN
  Administered 2023-12-21 – 2023-12-26 (×22): 5 mg via ORAL
  Filled 2023-12-21 (×22): qty 1

## 2023-12-21 MED ORDER — SODIUM CHLORIDE 0.9 % IV SOLN
INTRAVENOUS | Status: DC
Start: 2023-12-21 — End: 2023-12-22

## 2023-12-21 NOTE — Progress Notes (Signed)
 Patient ID: Dana [name], female   DOB: August 15, 1960, 63 y.o.   MRN: [id number] Patient is postoperative day 3 transtibial amputation.  There is a peel in place VAC dressing in place.  There is 75 cc in wound VAC canister there is a good suction fit.  Plan for discontinuing the wound VAC at time of discharge.  Apply dry dressing with the black stump shrinker on top.

## 2023-12-21 NOTE — Plan of Care (Signed)
  Problem: Education: Goal: Knowledge of General Education information will improve Description: Including pain rating scale, medication(s)/side effects and non-pharmacologic comfort measures Outcome: Progressing   Problem: Clinical Measurements: Goal: Ability to maintain clinical measurements within normal limits will improve Outcome: Progressing Goal: Will remain free from infection Outcome: Progressing   Problem: Nutrition: Goal: Adequate nutrition will be maintained Outcome: Progressing   Problem: Pain Managment: Goal: General experience of comfort will improve and/or be controlled Outcome: Progressing   Problem: Skin Integrity: Goal: Risk for impaired skin integrity will decrease Outcome: Progressing

## 2023-12-21 NOTE — Progress Notes (Signed)
 PROGRESS NOTE    Dana [name]  ONG:[id number] DOB: February 07, 1961 DOA: 12/14/2023 PCP: Mercy Stall, MD  Subjective: Pt seen and examined.  Foley removed yesterday. Pt does not normally have chronic foley.   Hospital Course: HPI: Dana [name] with medical history significant for insulin -dependent diabetes mellitus, CKD stage IV, CAD, chronic hypoxic respiratory failure, OSA on CPAP, chronic HFmrEF, depression, anxiety, chronic pain, and left trimalleolar ankle fracture/dislocation status postoperative repair on 11/13/2023 who presented to the [facility] ED the night of 12/12/2023 with fevers, dysuria, and flank pain.   [facility] ED and Hospital Course: Upon arrival to the ED, patient is found to be febrile.  She had a leukocytosis 24,700.  CT of the abdomen and pelvis demonstrates bladder wall thickening with surrounding fat stranding.  Blood and urine cultures were collected, she was given IV fluids and broad-spectrum antibiotics, and was admitted to the hospitalist service.   Shortly after admission, it was noted that she had purulent drainage from left heel wound as well as open wounds at the medial aspect of the left ankle.  Orthopedic surgery at [facility] [place], [place]) was consulted and recommended medical admission to [facility] where they will consult.   Blood cultures at [facility] are growing MRSA.  Significant Events: Admitted 12/14/2023 for surgical site infection left ankle with MRSA 12-18-2023 left BKA  Admission Labs: Na 134, K 4.3, CO2 of 22, BUN 50, Scr 2.25, glu 137 T. Prot 6.5, alb 2.1, AST 11, ALT 11, alk phos 144  Admission Imaging Studies: CXR Prominent cardiac silhouette and vascular congestion  XR left ankle Postop changes. Disrupted ankle mortise. Displaced fractures. Soft tissue swelling CT renal stone No hydronephrosis. 2. Mild nonspecific bilateral perinephric fat stranding, mildly increased. Correlate  clinically for infection. 3. Enlarged left ovary with surrounding inflammatory stranding and fluid, a new finding. Findings may be related to ovarian torsion or infection. Recommend further evaluation with pelvic ultrasound. 4. Increasing body wall edema and presacral edema. 5. Trace bilateral pleural effusions. 6. Cardiomegaly. 7. Mild splenomegaly. 8. Aortic atherosclerosis Pelvic U/S Status post hysterectomy. 2. Nonvisualization of the ovaries bilaterally. If indicated, pelvic MRI examination with contrast may be helpful for further evaluation. 3. Trace simple appearing free fluid within the right adnexa, nonspecific  Significant Labs:   Significant Imaging Studies: 12-15-2023 LE U/S negative for DVT 12-15-2023 echo LVEF 40%, hx of mitral clip 12-16-2023 V/Q scan No evidence of acute pulmonary embolism on perfusion scintigraphy by PISAPED criteria. Mildly heterogeneous left perihilar perfusion, similar to previous examination  Antibiotic Therapy: Anti-infectives (From admission, onward)    Start     Dose/Rate Route Frequency Ordered Stop   12/20/23 1000  DAPTOmycin  (CUBICIN ) IVPB 700 mg/100mL premix  Status:  Discontinued        700 mg 200 mL/hr over 30 Minutes Intravenous Daily 12/19/23 1009 12/20/23 0821   12/20/23 1000  DAPTOmycin  (CUBICIN ) IVPB 700 mg/182mL premix        700 mg 200 mL/hr over 30 Minutes Intravenous Every 48 hours 12/20/23 0821     12/18/23 0600  vancomycin  (VANCOREADY) IVPB 1250 mg/250 mL  Status:  Discontinued        1,250 mg 166.7 mL/hr over 90 Minutes Intravenous Every 48 hours 12/17/23 1452 12/19/23 1009   12/18/23 0600  ceFAZolin  (ANCEF ) IVPB 2g/100 mL premix  Status:  Discontinued        2 g 200 mL/hr over 30 Minutes Intravenous On call to  O.R. 12/17/23 2121 12/18/23 1254   12/16/23 1400  ceFAZolin  (ANCEF ) IVPB 1 g/50 mL premix        1 g 100 mL/hr over 30 Minutes Intravenous Every 8 hours 12/16/23 0937 12/20/23 0015   12/16/23 0600  Vancomycin  (VANCOCIN )  1,250 mg in sodium chloride  0.9 % 250 mL IVPB  Status:  Discontinued        1,250 mg 166.7 mL/hr over 90 Minutes Intravenous Every 48 hours 12/14/23 1009 12/17/23 1452   12/14/23 1800  piperacillin -tazobactam (ZOSYN ) IVPB 3.375 g  Status:  Discontinued        3.375 g 12.5 mL/hr over 240 Minutes Intravenous Every 8 hours 12/14/23 1710 12/16/23 0940   12/14/23 0600  vancomycin  (VANCOCIN ) IVPB 1000 mg/200 mL premix  Status:  Discontinued        1,000 mg 200 mL/hr over 60 Minutes Intravenous Every 24 hours 12/14/23 0210 12/14/23 1009   12/14/23 0215  vancomycin  (VANCOCIN ) IVPB 1000 mg/200 mL premix  Status:  Discontinued        1,000 mg 200 mL/hr over 60 Minutes Intravenous  Once 12/14/23 0122 12/14/23 0130   12/14/23 0215  cefTRIAXone  (ROCEPHIN ) 2 g in sodium chloride  0.9 % 100 mL IVPB  Status:  Discontinued        2 g 200 mL/hr over 30 Minutes Intravenous Daily at bedtime 12/14/23 0155 12/14/23 1703       Procedures: 12-18-2023 left BKA   Consultants: Infectious Disease Orthopedics Psych Cardiology    Assessment and Plan: * Surgical site infection 12-20-2023 remains on IV Daptomycin  for her MRSA bacteremia. Needs TEE on Monday. Then hopefully ins authorization for CIR.  12-21-2023 remains on IV Dapto. After her TEE on Monday, ID service to decide remaining duration of her abx therapy.  S/P BKA (below knee amputation), left (HCC) 12-20-2023 continue with IV dilaudid  prn. POD #2  12-21-2023 still using prn IV dilaudid . Continue  IV dilaudid  for now. POD #3  MRSA bacteremia 12-20-2023 remains on IV Daptomycin  for her MRSA bacteremia. Needs TEE on Monday. Then hopefully ins authorization for CIR.  12-21-2023 remains on IV Dapto. After her TEE on Monday, ID service to decide remaining duration of her abx therapy.   Depression with anxiety 12-20-2023 psych has signed off.  12-21-2023 stable.  Coronary artery disease involving native coronary artery of native heart with  angina pectoris (HCC) 12-20-2023 stable.  12-21-2023 stable.  CKD stage 4 due to type 1 diabetes mellitus (HCC) - baseline 2.2 - 2.4 12-20-2023 stable Scr. 2.44  12-21-2023 stable. BMP in AM.  Generalized anxiety disorder 12-20-2023 psych has signed off.  12-21-2023 stable.  Uncontrolled type 1 diabetes mellitus with hyperglycemia, with long-term current use of insulin  (HCC) 12-20-2023 had hypoglycemia last night. Long acting insulin  stopped. Changed to lowest dose SSI.  Last A1c of 10.2% in April, 2025. Monitor for DKA.  HgBA1c and mean plasma glucose: Lab Results  Component Value Date/Time   HGBA1C 10.2 (H) 11/12/2023 06:19 AM   MPG 246.04 11/12/2023 06:19 AM   12-21-2023 CBG stable. Monitor for DKA. Last dose long acting insulin (lantus ) at 2 pm on 12-19-2023.  Monitor to make sure she is not slipping into DKA.  OSA on CPAP 12-20-2023 stable.  12-21-2023 stable.  Chronic respiratory failure with hypoxia (HCC) 12-20-2023 stable.  12-21-2023 stable.  Chronic combined systolic (congestive) and diastolic (congestive) heart failure (HCC) 12-20-2023 cardiology has decreased IV lasix  to 60 mg daily.  12-21-2023 stable. Remains on 60 mg IV lasix  per day.  UTI (urinary tract infection)-resolved as of 12/20/2023 Pt completed 5 days of IV ancef  for her UTI.   DVT prophylaxis: heparin  injection 5,000 Units Start: 12/14/23 1400 SCDs Start: 12/14/23 0119    Code Status: Full Code Family Communication: no family at bedside Disposition Plan: CIR vs SNF Reason for continuing need for hospitalization: awaiting TEE on Monday. Remains on IV lasix .  Objective: Vitals:   12/20/23 2008 12/21/23 0611 12/21/23 0733 12/21/23 1218  BP: (!) 121/54 (!) 144/58 (!) 145/61 137/66  Pulse: 63 69    Resp: 15 18    Temp: 97.7 F (36.5 C) 97.9 F (36.6 C) 98.8 F (37.1 C) 97.9 F (36.6 C)  TempSrc: Oral Oral Oral Oral  SpO2: 100% 93%    Weight:      Height:        Intake/Output Summary  (Last 24 hours) at 12/21/2023 1231 Last data filed at 12/21/2023 0932 Gross per 24 hour  Intake 106 ml  Output --  Net 106 ml   Filed Weights   12/14/23 0100 12/14/23 0200  Weight: 103.3 kg 98 kg    Examination:  Physical Exam Vitals and nursing note reviewed.  Constitutional:      Appearance: She is obese.  HENT:     Head: Normocephalic and atraumatic.  Cardiovascular:     Rate and Rhythm: Normal rate and regular rhythm.  Pulmonary:     Effort: Pulmonary effort is normal.     Breath sounds: Normal breath sounds.  Abdominal:     General: Bowel sounds are normal.  Musculoskeletal:     Comments: Left BKA. Stump protector in place  Skin:    General: Skin is warm and dry.     Data Reviewed: I have personally reviewed following labs and imaging studies  CBC: Recent Labs  Lab 12/16/23 0315 12/17/23 0409 12/18/23 0351 12/19/23 0433 12/20/23 0509  WBC 11.0* 14.9* 13.4* 17.3* 14.5*  HGB 9.2* 9.4* 9.5* 9.3* 8.4*  HCT 28.9* 30.0* 30.4* 30.7* 27.3*  MCV 85.0 85.7 87.6 87.5 87.8  PLT 250 278 280 316 297   Basic Metabolic Panel: Recent Labs  Lab 12/17/23 0409 12/18/23 0351 12/19/23 0433 12/20/23 0509 12/21/23 0834  NA 135 135 133* 132* 134*  K 4.3 4.5 4.6 4.5 4.6  CL 102 99 97* 96* 101  CO2 24 25 25 27 24   GLUCOSE 159* 80 238* 128* 138*  BUN 52* 57* 59* 60* 63*  CREATININE 2.06* 2.05* 2.25* 2.44* 2.32*  CALCIUM  8.4* 8.8* 8.5* 8.2* 8.2*   GFR: Estimated Creatinine Clearance: 29.9 mL/min (A) (by C-G formula based on SCr of 2.32 mg/dL (H)). Cardiac Enzymes: Recent Labs  Lab 12/20/23 0509  CKTOTAL 8*   BNP (last 3 results) Recent Labs    07/13/23 1325 08/26/23 2233  BNP 212.4* 511.3*   CBG: Recent Labs  Lab 12/20/23 1142 12/20/23 1559 12/20/23 2119 12/21/23 0737 12/21/23 1221  GLUCAP 164* 156* 231* 135* 157*   Sepsis Labs: Recent Labs  Lab 12/14/23 1703  PROCALCITON 6.90    Recent Results (from the past 240 hours)  Urine Culture (for  pregnant, neutropenic or urologic patients or patients with an indwelling urinary catheter)     Status: Abnormal   Collection Time: 12/14/23  1:56 AM   Specimen: Urine, Clean Catch  Result Value Ref Range Status   Specimen Description URINE, CLEAN CATCH  Final   Special Requests NONE  Final   Culture (A)  Final    <10,000 COLONIES/mL INSIGNIFICANT GROWTH  Performed at [facility], [address], [place], [place] [address]    Report Status 12/15/2023 FINAL  Final  Culture, blood (x 2)     Status: None   Collection Time: 12/14/23  2:14 AM   Specimen: BLOOD RIGHT ARM  Result Value Ref Range Status   Specimen Description BLOOD RIGHT ARM  Final   Special Requests   Final    BOTTLES DRAWN AEROBIC AND ANAEROBIC Blood Culture adequate volume   Culture   Final    NO GROWTH 5 DAYS Performed at [facility], [address], [place], [place] [address]    Report Status 12/19/2023 FINAL  Final  Culture, blood (x 2)     Status: None   Collection Time: 12/14/23  2:14 AM   Specimen: BLOOD RIGHT HAND  Result Value Ref Range Status   Specimen Description BLOOD RIGHT HAND  Final   Special Requests   Final    BOTTLES DRAWN AEROBIC AND ANAEROBIC Blood Culture adequate volume   Culture   Final    NO GROWTH 5 DAYS Performed at [facility], [address] [place], [place] [address]    Report Status 12/19/2023 FINAL  Final  Surgical pcr screen     Status: None   Collection Time: 12/18/23  9:15 AM   Specimen: Nasal Mucosa; Nasal Swab  Result Value Ref Range Status   MRSA, PCR NEGATIVE NEGATIVE Final   Staphylococcus aureus NEGATIVE NEGATIVE Final    Comment: (NOTE) The Xpert SA Assay (FDA approved for NASAL specimens in patients 63 years of age and older), is one component of a comprehensive surveillance program. It is not intended to diagnose infection nor to guide or monitor treatment. Performed at [facility], [address], [place], [place] [address]       Radiology Studies: No results found.  Scheduled Meds:  allopurinol   50 mg Oral Daily   aspirin  EC  81 mg Oral Daily   carvedilol   6.25 mg Oral BID WC   Chlorhexidine  Gluconate Cloth  6 each Topical Daily   escitalopram   10 mg Oral Daily   furosemide   60 mg Intravenous Daily   gabapentin   300 mg Oral TID   heparin  injection (subcutaneous)  5,000 Units Subcutaneous Q8H   insulin  aspart  0-6 Units Subcutaneous TID AC & HS   latanoprost   1 drop Both Eyes QHS   pantoprazole   40 mg Oral Daily   polyethylene glycol  17 g Oral Daily   senna  2 tablet Oral QHS   sodium chloride  flush  3 mL Intravenous Q12H   ticagrelor   90 mg Oral BID   Continuous Infusions:  sodium chloride      DAPTOmycin  Stopped (12/20/23 1208)     LOS: 7 days   Time spent: 50 minutes  Unk Garb, DO  [facility]  12/21/2023, 12:31 PM

## 2023-12-21 NOTE — Progress Notes (Signed)
  Progress Note  Patient Name: Dana [name] Date of Encounter: 12/21/2023 [facility] Cardiologist: Jules Oar, MD    Interval Summary   Pt is feeling better    Vital Signs Vitals:   12/20/23 2008 12/21/23 0611 12/21/23 0733 12/21/23 1218  BP: (!) 121/54 (!) 144/58 (!) 145/61 137/66  Pulse: 63 69    Resp: 15 18    Temp: 97.7 F (36.5 C) 97.9 F (36.6 C) 98.8 F (37.1 C) 97.9 F (36.6 C)  TempSrc: Oral Oral Oral Oral  SpO2: 100% 93%    Weight:      Height:        Intake/Output Summary (Last 24 hours) at 12/21/2023 1302 Last data filed at 12/21/2023 0932 Gross per 24 hour  Intake 106 ml  Output --  Net 106 ml      12/14/2023    2:00 AM 12/14/2023    1:00 AM 11/12/2023    4:16 AM  Last 3 Weights  Weight (lbs) 216 lb 0.8 oz 227 lb 11.8 oz 214 lb 0.4 oz  Weight (kg) 98 kg 103.3 kg 97.08 kg      Telemetry/ECG  NSR  - Personally Reviewed  Physical Exam  GEN: No acute distress.   Neck: No JVD Cardiac: RRR, no murmurs, rubs, or gallops.  Respiratory: Clear to auscultation bilaterally. GI: Soft, nontender, non-distended  MS: No edema  Assessment & Plan   Acute on chronic combined CHF;   Is diuresing well.   Feeling much better  Is for TEE on Monday   2.  MRSA bacteremia:   s/p ambutation of left BKA this past week .   For TEE . We discussed the risks, benefits, options of TEE .  She understands and agrees to proceed.          For questions or updates, please contact [facility] Please consult [email or web address] for contact info under       Signed, Ahmad Alert, MD

## 2023-12-22 DIAGNOSIS — I5043 Acute on chronic combined systolic (congestive) and diastolic (congestive) heart failure: Secondary | ICD-10-CM

## 2023-12-22 DIAGNOSIS — R7881 Bacteremia: Secondary | ICD-10-CM | POA: Diagnosis not present

## 2023-12-22 DIAGNOSIS — Z89512 Acquired absence of left leg below knee: Secondary | ICD-10-CM | POA: Diagnosis not present

## 2023-12-22 DIAGNOSIS — I5042 Chronic combined systolic (congestive) and diastolic (congestive) heart failure: Secondary | ICD-10-CM | POA: Diagnosis not present

## 2023-12-22 DIAGNOSIS — T8149XA Infection following a procedure, other surgical site, initial encounter: Secondary | ICD-10-CM | POA: Diagnosis not present

## 2023-12-22 LAB — GLUCOSE, CAPILLARY
Glucose-Capillary: 135 mg/dL — ABNORMAL HIGH (ref 70–99)
Glucose-Capillary: 151 mg/dL — ABNORMAL HIGH (ref 70–99)
Glucose-Capillary: 187 mg/dL — ABNORMAL HIGH (ref 70–99)
Glucose-Capillary: 269 mg/dL — ABNORMAL HIGH (ref 70–99)

## 2023-12-22 LAB — BASIC METABOLIC PANEL WITH GFR
Anion gap: 10 (ref 5–15)
BUN: 61 mg/dL — ABNORMAL HIGH (ref 6–20)
CO2: 24 mmol/L (ref 22–32)
Calcium: 8.6 mg/dL — ABNORMAL LOW (ref 8.9–10.3)
Chloride: 101 mmol/L (ref 98–111)
Creatinine, Ser: 2.22 mg/dL — ABNORMAL HIGH (ref 0.44–1.00)
GFR, Estimated: 25 mL/min — ABNORMAL LOW (ref >60)
Glucose, Bld: 150 mg/dL — ABNORMAL HIGH (ref 70–99)
Potassium: 4.6 mmol/L (ref 3.5–5.1)
Sodium: 135 mmol/L (ref 135–145)

## 2023-12-22 NOTE — Progress Notes (Signed)
 PROGRESS NOTE    [name]  EAV:[id number] DOB: Oct 08, 1960 DOA: 12/14/2023 PCP: Mercy Stall, MD  Subjective: Pt seen and examined.  CBG rose slightly yesterday. Pt states she had some sweets yesterday evening. This AM CBG back down to 135. Remains off lantus .    Hospital Course: HPI: Dana [name] is a pleasant 63 y.o. female with medical history significant for insulin -dependent diabetes mellitus, CKD stage IV, CAD, chronic hypoxic respiratory failure, OSA on CPAP, chronic HFmrEF, depression, anxiety, chronic pain, and left trimalleolar ankle fracture/dislocation status postoperative repair on 11/13/2023 who presented to the [facility] ED the night of 12/12/2023 with fevers, dysuria, and flank pain.   [facility] ED and Hospital Course: Upon arrival to the ED, patient is found to be febrile.  She had a leukocytosis 24,700.  CT of the abdomen and pelvis demonstrates bladder wall thickening with surrounding fat stranding.  Blood and urine cultures were collected, she was given IV fluids and broad-spectrum antibiotics, and was admitted to the hospitalist service.   Shortly after admission, it was noted that she had purulent drainage from left heel wound as well as open wounds at the medial aspect of the left ankle.  Orthopedic surgery at [facility] [place], [place]) was consulted and recommended medical admission to [facility] where they will consult.   Blood cultures at [facility] are growing MRSA.  Significant Events: Admitted 12/14/2023 for surgical site infection left ankle with MRSA 12-18-2023 left BKA  Admission Labs: Na 134, K 4.3, CO2 of 22, BUN 50, Scr 2.25, glu 137 T. Prot 6.5, alb 2.1, AST 11, ALT 11, alk phos 144  Admission Imaging Studies: CXR Prominent cardiac silhouette and vascular congestion  XR left ankle Postop changes. Disrupted ankle mortise. Displaced fractures. Soft tissue swelling CT renal stone No hydronephrosis. 2. Mild nonspecific bilateral  perinephric fat stranding, mildly increased. Correlate clinically for infection. 3. Enlarged left ovary with surrounding inflammatory stranding and fluid, a new finding. Findings may be related to ovarian torsion or infection. Recommend further evaluation with pelvic ultrasound. 4. Increasing body wall edema and presacral edema. 5. Trace bilateral pleural effusions. 6. Cardiomegaly. 7. Mild splenomegaly. 8. Aortic atherosclerosis Pelvic U/S Status post hysterectomy. 2. Nonvisualization of the ovaries bilaterally. If indicated, pelvic MRI examination with contrast may be helpful for further evaluation. 3. Trace simple appearing free fluid within the right adnexa, nonspecific  Significant Labs:   Significant Imaging Studies: 12-15-2023 LE U/S negative for DVT 12-15-2023 echo LVEF 40%, hx of mitral clip 12-16-2023 V/Q scan No evidence of acute pulmonary embolism on perfusion scintigraphy by PISAPED criteria. Mildly heterogeneous left perihilar perfusion, similar to previous examination  Antibiotic Therapy: Anti-infectives (From admission, onward)    Start     Dose/Rate Route Frequency Ordered Stop   12/20/23 1000  DAPTOmycin  (CUBICIN ) IVPB 700 mg/100mL premix  Status:  Discontinued        700 mg 200 mL/hr over 30 Minutes Intravenous Daily 12/19/23 1009 12/20/23 0821   12/20/23 1000  DAPTOmycin  (CUBICIN ) IVPB 700 mg/14mL premix        700 mg 200 mL/hr over 30 Minutes Intravenous Every 48 hours 12/20/23 0821     12/18/23 0600  vancomycin  (VANCOREADY) IVPB 1250 mg/250 mL  Status:  Discontinued        1,250 mg 166.7 mL/hr over 90 Minutes Intravenous Every 48 hours 12/17/23 1452 12/19/23 1009   12/18/23 0600  ceFAZolin  (ANCEF ) IVPB 2g/100 mL premix  Status:  Discontinued  2 g 200 mL/hr over 30 Minutes Intravenous On call to O.R. 12/17/23 2121 12/18/23 1254   12/16/23 1400  ceFAZolin  (ANCEF ) IVPB 1 g/50 mL premix        1 g 100 mL/hr over 30 Minutes Intravenous Every 8 hours 12/16/23 0937  12/20/23 0015   12/16/23 0600  Vancomycin  (VANCOCIN ) 1,250 mg in sodium chloride  0.9 % 250 mL IVPB  Status:  Discontinued        1,250 mg 166.7 mL/hr over 90 Minutes Intravenous Every 48 hours 12/14/23 1009 12/17/23 1452   12/14/23 1800  piperacillin -tazobactam (ZOSYN ) IVPB 3.375 g  Status:  Discontinued        3.375 g 12.5 mL/hr over 240 Minutes Intravenous Every 8 hours 12/14/23 1710 12/16/23 0940   12/14/23 0600  vancomycin  (VANCOCIN ) IVPB 1000 mg/200 mL premix  Status:  Discontinued        1,000 mg 200 mL/hr over 60 Minutes Intravenous Every 24 hours 12/14/23 0210 12/14/23 1009   12/14/23 0215  vancomycin  (VANCOCIN ) IVPB 1000 mg/200 mL premix  Status:  Discontinued        1,000 mg 200 mL/hr over 60 Minutes Intravenous  Once 12/14/23 0122 12/14/23 0130   12/14/23 0215  cefTRIAXone  (ROCEPHIN ) 2 g in sodium chloride  0.9 % 100 mL IVPB  Status:  Discontinued        2 g 200 mL/hr over 30 Minutes Intravenous Daily at bedtime 12/14/23 0155 12/14/23 1703       Procedures: 12-18-2023 left BKA   Consultants: Infectious Disease Orthopedics Psych Cardiology    Assessment and Plan: * Surgical site infection 12-20-2023 remains on IV Daptomycin  for her MRSA bacteremia. Needs TEE on Monday. Then hopefully ins authorization for CIR.  12-21-2023 remains on IV Dapto. After her TEE on Monday, ID service to decide remaining duration of her abx therapy.  12-22-2023 NPO after MN. For TEE tomorrow.  S/P BKA (below knee amputation), left (HCC) 12-20-2023 continue with IV dilaudid  prn. POD #2  12-21-2023 still using prn IV dilaudid . Continue  IV dilaudid  for now. POD #3  12-22-2023 still using prn IV dilaudid . POD #4. Continue IV dilaudid  for 1-2 more days.  MRSA bacteremia 12-20-2023 remains on IV Daptomycin  for her MRSA bacteremia. Needs TEE on Monday. Then hopefully ins authorization for CIR.  12-21-2023 remains on IV Dapto. After her TEE on Monday, ID service to decide remaining  duration of her abx therapy.   12-22-2023 NPO after MN. For TEE tomorrow.   Depression with anxiety 12-20-2023 psych has signed off.  12-21-2023 stable.  12-22-2023 stable.  Coronary artery disease involving native coronary artery of native heart with angina pectoris (HCC) 12-20-2023 stable.  12-21-2023 stable.  12-22-2023 stable.  CKD stage 4 due to type 1 diabetes mellitus (HCC) - baseline 2.2 - 2.4 12-20-2023 stable Scr. 2.44  12-21-2023 stable. BMP in AM.  12-22-2023 stable. Scr 2.22 today.  Generalized anxiety disorder 12-20-2023 psych has signed off.  12-21-2023 stable.  12-22-2023 stable.  Uncontrolled type 1 diabetes mellitus with hyperglycemia, with long-term current use of insulin  (HCC) 12-20-2023 had hypoglycemia last night. Long acting insulin  stopped. Changed to lowest dose SSI.  Last A1c of 10.2% in April, 2025. Monitor for DKA.  HgBA1c and mean plasma glucose: Lab Results  Component Value Date/Time   HGBA1C 10.2 (H) 11/12/2023 06:19 AM   MPG 246.04 11/12/2023 06:19 AM   12-21-2023 CBG stable. Monitor for DKA. Last dose long acting insulin (lantus ) at 2 pm on 12-19-2023.  Monitor to make sure  she is not slipping into DKA.  12-22-2023 stable.  OSA on CPAP 12-20-2023 stable.  12-21-2023 stable.  12-22-2023 stable.  Chronic respiratory failure with hypoxia (HCC) 12-20-2023 stable.  12-21-2023 stable.  12-22-2023 stable. On RA.  Chronic combined systolic (congestive) and diastolic (congestive) heart failure (HCC) 12-20-2023 cardiology has decreased IV lasix  to 60 mg daily.  12-21-2023 stable. Remains on 60 mg IV lasix  per day.  12-22-2023 stable. Cards has kept her on 60 mg IV lasix  per day.  UTI (urinary tract infection)-resolved as of 12/20/2023 Pt completed 5 days of IV ancef  for her UTI.  DVT prophylaxis: heparin  injection 5,000 Units Start: 12/14/23 1400 SCDs Start: 12/14/23 0119    Code Status: Full Code Family Communication: no  family at bedside Disposition Plan: CIR vs home vs SNF Reason for continuing need for hospitalization: for TEE tomorrow. Remains on IV daptomycin .  Objective: Vitals:   12/22/23 0000 12/22/23 0333 12/22/23 0752 12/22/23 1109  BP: (!) 155/66 (!) 156/64 (!) 153/59 (!) 148/61  Pulse: 64 66 76 66  Resp: 16 16 16 16   Temp: 98 F (36.7 C) 98.1 F (36.7 C) 98.4 F (36.9 C) 97.9 F (36.6 C)  TempSrc: Oral Oral Oral Oral  SpO2: 94% 92% 91% 96%  Weight:      Height:        Intake/Output Summary (Last 24 hours) at 12/22/2023 1341 Last data filed at 12/22/2023 1110 Gross per 24 hour  Intake 243 ml  Output 3000 ml  Net -2757 ml   Filed Weights   12/14/23 0100 12/14/23 0200  Weight: 103.3 kg 98 kg    Examination:  Physical Exam Vitals and nursing note reviewed.  Constitutional:      Appearance: She is obese.  HENT:     Head: Normocephalic and atraumatic.  Eyes:     General: No scleral icterus. Cardiovascular:     Rate and Rhythm: Normal rate and regular rhythm.  Pulmonary:     Effort: Pulmonary effort is normal.     Breath sounds: Normal breath sounds.  Abdominal:     General: Bowel sounds are normal.     Palpations: Abdomen is soft.  Musculoskeletal:     Comments: Left BKA. Stump protector in place  Skin:    Capillary Refill: Capillary refill takes less than 2 seconds.  Neurological:     Mental Status: She is alert and oriented to person, place, and time.     Data Reviewed: I have personally reviewed following labs and imaging studies  CBC: Recent Labs  Lab 12/16/23 0315 12/17/23 0409 12/18/23 0351 12/19/23 0433 12/20/23 0509  WBC 11.0* 14.9* 13.4* 17.3* 14.5*  HGB 9.2* 9.4* 9.5* 9.3* 8.4*  HCT 28.9* 30.0* 30.4* 30.7* 27.3*  MCV 85.0 85.7 87.6 87.5 87.8  PLT 250 278 280 316 297   Basic Metabolic Panel: Recent Labs  Lab 12/18/23 0351 12/19/23 0433 12/20/23 0509 12/21/23 0834 12/22/23 0404  NA 135 133* 132* 134* 135  K 4.5 4.6 4.5 4.6 4.6  CL 99 97*  96* 101 101  CO2 25 25 27 24 24   GLUCOSE 80 238* 128* 138* 150*  BUN 57* 59* 60* 63* 61*  CREATININE 2.05* 2.25* 2.44* 2.32* 2.22*  CALCIUM  8.8* 8.5* 8.2* 8.2* 8.6*   GFR: Estimated Creatinine Clearance: 31.2 mL/min (A) (by C-G formula based on SCr of 2.22 mg/dL (H)). Cardiac Enzymes: Recent Labs  Lab 12/20/23 0509  CKTOTAL 8*   BNP (last 3 results) Recent Labs    07/13/23  1325 08/26/23 2233  BNP 212.4* 511.3*   CBG: Recent Labs  Lab 12/21/23 1221 12/21/23 1649 12/21/23 2140 12/22/23 0748 12/22/23 1107  GLUCAP 157* 196* 235* 135* 151*    Recent Results (from the past 240 hours)  Urine Culture (for pregnant, neutropenic or urologic patients or patients with an indwelling urinary catheter)     Status: Abnormal   Collection Time: 12/14/23  1:56 AM   Specimen: Urine, Clean Catch  Result Value Ref Range Status   Specimen Description URINE, CLEAN CATCH  Final   Special Requests NONE  Final   Culture (A)  Final    <10,000 COLONIES/mL INSIGNIFICANT GROWTH Performed at [facility], [address], [place], [place] [address]    Report Status 12/15/2023 FINAL  Final  Culture, blood (x 2)     Status: None   Collection Time: 12/14/23  2:14 AM   Specimen: BLOOD RIGHT ARM  Result Value Ref Range Status   Specimen Description BLOOD RIGHT ARM  Final   Special Requests   Final    BOTTLES DRAWN AEROBIC AND ANAEROBIC Blood Culture adequate volume   Culture   Final    NO GROWTH 5 DAYS Performed at [facility], [address], [place], [place] [address]    Report Status 12/19/2023 FINAL  Final  Culture, blood (x 2)     Status: None   Collection Time: 12/14/23  2:14 AM   Specimen: BLOOD RIGHT HAND  Result Value Ref Range Status   Specimen Description BLOOD RIGHT HAND  Final   Special Requests   Final    BOTTLES DRAWN AEROBIC AND ANAEROBIC Blood Culture adequate volume   Culture   Final    NO GROWTH 5 DAYS Performed at [facility], [address]  [place], [place] [address]    Report Status 12/19/2023 FINAL  Final  Surgical pcr screen     Status: None   Collection Time: 12/18/23  9:15 AM   Specimen: Nasal Mucosa; Nasal Swab  Result Value Ref Range Status   MRSA, PCR NEGATIVE NEGATIVE Final   Staphylococcus aureus NEGATIVE NEGATIVE Final    Comment: (NOTE) The Xpert SA Assay (FDA approved for NASAL specimens in patients 42 years of age and older), is one component of a comprehensive surveillance program. It is not intended to diagnose infection nor to guide or monitor treatment. Performed at [facility] Lab, [address] [place], [place] [address]      Radiology Studies: No results found.  Scheduled Meds:  allopurinol   50 mg Oral Daily   aspirin  EC  81 mg Oral Daily   carvedilol   6.25 mg Oral BID WC   Chlorhexidine  Gluconate Cloth  6 each Topical Daily   escitalopram   10 mg Oral Daily   furosemide   60 mg Intravenous Daily   gabapentin   300 mg Oral TID   heparin  injection (subcutaneous)  5,000 Units Subcutaneous Q8H   insulin  aspart  0-6 Units Subcutaneous TID AC & HS   latanoprost   1 drop Both Eyes QHS   pantoprazole   40 mg Oral Daily   polyethylene glycol  17 g Oral Daily   senna  2 tablet Oral QHS   sodium chloride  flush  3 mL Intravenous Q12H   ticagrelor   90 mg Oral BID   Continuous Infusions:  DAPTOmycin  700 mg (12/22/23 1134)     LOS: 8 days   Time spent: 55 minutes  Unk Garb, DO  [facility]  12/22/2023, 1:41 PM

## 2023-12-22 NOTE — Anesthesia Preprocedure Evaluation (Signed)
 Anesthesia Evaluation  Patient identified by MRN, date of birth, ID band Patient awake    Reviewed: Allergy  & Precautions, NPO status , Patient's Chart, lab work & pertinent test results  History of Anesthesia Complications Negative for: history of anesthetic complications  Airway Mallampati: III  TM Distance: >3 FB Neck ROM: Full    Dental  (+) Missing, Dental Advisory Given   Pulmonary sleep apnea (uses 2 LPM O2 at night), Continuous Positive Airway Pressure Ventilation and Oxygen  sleep apnea , COPD, former smoker   Pulmonary exam normal breath sounds clear to auscultation       Cardiovascular hypertension, Pt. on home beta blockers pulmonary hypertension(-) angina + CAD, + Past MI, + Cardiac Stents (2016, 2018, 2022, 2024) and +CHF (EF 40-45%)  + dysrhythmias Atrial Fibrillation + Valvular Problems/Murmurs (s/p MV clip, moderate TR and PI) MR  Rhythm:Regular Rate:Normal  H/o PEA 01/13/2021 during admission for DKA, volume overload, possible CAP, hypoxia  HLD  TTE 12/15/2023: IMPRESSIONS    1. Left ventricular ejection fraction, by estimation, is 40 to 45%. The  left ventricle has mildly decreased function. The left ventricle  demonstrates global hypokinesis. There is mild left ventricular  hypertrophy. Left ventricular diastolic function  could not be evaluated. There is the interventricular septum is flattened  in systole and diastole, consistent with right ventricular pressure and  volume overload.   2. Right ventricular systolic function is low normal. The right  ventricular size is enlarged. There is severely elevated pulmonary artery  systolic pressure. The estimated right ventricular systolic pressure is  65.7 mmHg.   3. Left atrial size was mildly dilated.   4. S/P mitral clip, mild to moderate MR, no valvular stenosis (MG 5 mmHg,  HR 72bpm). . The mean mitral valve gradient is 5.0 mmHg with average heart  rate of 72  bpm.   5. Tricuspid valve regurgitation is moderate.   6. The aortic valve is tricuspid. Aortic valve regurgitation is not  visualized. Aortic valve sclerosis is present, with no evidence of aortic  valve stenosis.   7. Pulmonic valve regurgitation is moderate.   8. The inferior vena cava is dilated in size with <50% respiratory  variability, suggesting right atrial pressure of 15 mmHg.   Coronary Balloon Angioplasty 07/18/2023:   Mid LAD to Dist LAD lesion is 60% stenosed.   Mid LAD lesion is 90% stenosed.   Mid Cx to Dist Cx lesion is 100% stenosed.   1st Mrg-1 lesion is 30% stenosed.   1st Mrg-2 lesion is 90% stenosed.   Non-stenotic Mid Cx lesion was previously treated.   Scoring balloon angioplasty was performed using a BALLN SCOREFLEX 3.0X10.   Post intervention, there is a 10% residual stenosis.   Severe restenosis of a focal segment of the proximal LAD stented segment.  Successful scoring balloon angioplasty of the proximal LAD stented segment.      Neuro/Psych Seizures - (x2, last one in 1999, not on AEDs),  PSYCHIATRIC DISORDERS Anxiety Depression    H/o anoxic brain injury  Neuromuscular disease (neuropathy) CVA (left-sided weakness), Residual Symptoms    GI/Hepatic Neg liver ROS,GERD  Medicated,,  Endo/Other  diabetes, Type 1Hypothyroidism  Secondary hyperparathyroidism  Renal/GU CRFRenal disease     Musculoskeletal  (+) Arthritis , Rheumatoid disorders,    Abdominal   Peds  Hematology  (+) Blood dyscrasia, anemia Lab Results      Component                Value  Date                      WBC                      14.5 (H)            12/20/2023                HGB                      8.4 (L)             12/20/2023                HCT                      27.3 (L)            12/20/2023                MCV                      87.8                12/20/2023                PLT                      297                 12/20/2023               Anesthesia Other Findings Bacteremia   On Brilinta   Reproductive/Obstetrics                             Anesthesia Physical Anesthesia Plan  ASA: 4  Anesthesia Plan: MAC   Post-op Pain Management: Minimal or no pain anticipated   Induction: Intravenous  PONV Risk Score and Plan: 2 and Propofol  infusion, TIVA and Treatment may vary due to age or medical condition  Airway Management Planned: Natural Airway and Nasal Cannula  Additional Equipment:   Intra-op Plan:   Post-operative Plan:   Informed Consent: I have reviewed the patients History and Physical, chart, labs and discussed the procedure including the risks, benefits and alternatives for the proposed anesthesia with the patient or authorized representative who has indicated his/her understanding and acceptance.     Dental advisory given  Plan Discussed with: CRNA and Anesthesiologist  Anesthesia Plan Comments: (Discussed with patient risks of MAC including, but not limited to, minor pain or discomfort, hearing people in the room, and possible need for backup general anesthesia. Risks for general anesthesia also discussed including, but not limited to, sore throat, hoarse voice, chipped/damaged teeth, injury to vocal cords, nausea and vomiting, allergic reactions, lung infection, heart attack, stroke, and death. All questions answered. )       Anesthesia Quick Evaluation

## 2023-12-22 NOTE — Progress Notes (Signed)
  Progress Note  Patient Name: Dana [name] Date of Encounter: 12/22/2023 [facility] Cardiologist: Jules Oar, MD    Interval Summary   Pt is feeling better     Vital Signs Vitals:   12/22/23 0000 12/22/23 0333 12/22/23 0752 12/22/23 1109  BP: (!) 155/66 (!) 156/64 (!) 153/59 (!) 148/61  Pulse: 64 66 76 66  Resp: 16 16 16 16   Temp: 98 F (36.7 C) 98.1 F (36.7 C) 98.4 F (36.9 C) 97.9 F (36.6 C)  TempSrc: Oral Oral Oral Oral  SpO2: 94% 92% 91% 96%  Weight:      Height:        Intake/Output Summary (Last 24 hours) at 12/22/2023 1120 Last data filed at 12/22/2023 1110 Gross per 24 hour  Intake 243 ml  Output 3700 ml  Net -3457 ml      12/14/2023    2:00 AM 12/14/2023    1:00 AM 11/12/2023    4:16 AM  Last 3 Weights  Weight (lbs) 216 lb 0.8 oz 227 lb 11.8 oz 214 lb 0.4 oz  Weight (kg) 98 kg 103.3 kg 97.08 kg      Telemetry/ECG  NSR   - Personally Reviewed  Physical Exam  GEN: No acute distress.   Neck: No JVD Cardiac: RRR, no murmurs, rubs, or gallops.  Respiratory: Clear to auscultation bilaterally. GI: Soft, nontender, non-distended  MS:  s/p left BKA    Assessment & Plan   Acute on chronic combined CHF;   Is diuresing well.   Feeling much better  Is for TEE on Monday   2.  MRSA bacteremia:   s/p ambutation of left BKA this past week .   For TEE . We discussed the risks, benefits, options of TEE .  She understands and agrees to proceed.          For questions or updates, please contact [facility] Please consult [email or web address] for contact info under       Signed, Ahmad Alert, MD

## 2023-12-22 NOTE — H&P (View-Only) (Signed)
  Progress Note  Patient Name: Dana [name] Date of Encounter: 12/22/2023 [facility] Cardiologist: Jules Oar, MD    Interval Summary   Pt is feeling better     Vital Signs Vitals:   12/22/23 0000 12/22/23 0333 12/22/23 0752 12/22/23 1109  BP: (!) 155/66 (!) 156/64 (!) 153/59 (!) 148/61  Pulse: 64 66 76 66  Resp: 16 16 16 16   Temp: 98 F (36.7 C) 98.1 F (36.7 C) 98.4 F (36.9 C) 97.9 F (36.6 C)  TempSrc: Oral Oral Oral Oral  SpO2: 94% 92% 91% 96%  Weight:      Height:        Intake/Output Summary (Last 24 hours) at 12/22/2023 1120 Last data filed at 12/22/2023 1110 Gross per 24 hour  Intake 243 ml  Output 3700 ml  Net -3457 ml      12/14/2023    2:00 AM 12/14/2023    1:00 AM 11/12/2023    4:16 AM  Last 3 Weights  Weight (lbs) 216 lb 0.8 oz 227 lb 11.8 oz 214 lb 0.4 oz  Weight (kg) 98 kg 103.3 kg 97.08 kg      Telemetry/ECG  NSR   - Personally Reviewed  Physical Exam  GEN: No acute distress.   Neck: No JVD Cardiac: RRR, no murmurs, rubs, or gallops.  Respiratory: Clear to auscultation bilaterally. GI: Soft, nontender, non-distended  MS:  s/p left BKA    Assessment & Plan   Acute on chronic combined CHF;   Is diuresing well.   Feeling much better  Is for TEE on Monday   2.  MRSA bacteremia:   s/p ambutation of left BKA this past week .   For TEE . We discussed the risks, benefits, options of TEE .  She understands and agrees to proceed.          For questions or updates, please contact [facility] Please consult [email or web address] for contact info under       Signed, Ahmad Alert, MD

## 2023-12-22 NOTE — Plan of Care (Signed)

## 2023-12-23 ENCOUNTER — Inpatient Hospital Stay (HOSPITAL_COMMUNITY): Payer: Self-pay | Admitting: Anesthesiology

## 2023-12-23 ENCOUNTER — Inpatient Hospital Stay (HOSPITAL_COMMUNITY)

## 2023-12-23 ENCOUNTER — Encounter (HOSPITAL_COMMUNITY)
Admission: EM | Disposition: A | Discharge status: Other Acute Inpatient Hospital | Payer: Self-pay | Source: Other Acute Inpatient Hospital | Attending: Internal Medicine

## 2023-12-23 ENCOUNTER — Encounter (HOSPITAL_COMMUNITY): Payer: Self-pay | Admitting: Cardiology

## 2023-12-23 DIAGNOSIS — R7881 Bacteremia: Secondary | ICD-10-CM | POA: Diagnosis not present

## 2023-12-23 DIAGNOSIS — N184 Chronic kidney disease, stage 4 (severe): Secondary | ICD-10-CM

## 2023-12-23 DIAGNOSIS — I340 Nonrheumatic mitral (valve) insufficiency: Secondary | ICD-10-CM

## 2023-12-23 DIAGNOSIS — Z89512 Acquired absence of left leg below knee: Secondary | ICD-10-CM | POA: Diagnosis not present

## 2023-12-23 DIAGNOSIS — I5042 Chronic combined systolic (congestive) and diastolic (congestive) heart failure: Secondary | ICD-10-CM

## 2023-12-23 DIAGNOSIS — I130 Hypertensive heart and chronic kidney disease with heart failure and stage 1 through stage 4 chronic kidney disease, or unspecified chronic kidney disease: Secondary | ICD-10-CM | POA: Diagnosis not present

## 2023-12-23 DIAGNOSIS — B9562 Methicillin resistant Staphylococcus aureus infection as the cause of diseases classified elsewhere: Secondary | ICD-10-CM | POA: Diagnosis not present

## 2023-12-23 DIAGNOSIS — T8149XA Infection following a procedure, other surgical site, initial encounter: Secondary | ICD-10-CM | POA: Diagnosis not present

## 2023-12-23 DIAGNOSIS — I2510 Atherosclerotic heart disease of native coronary artery without angina pectoris: Secondary | ICD-10-CM | POA: Diagnosis not present

## 2023-12-23 DIAGNOSIS — D72829 Elevated white blood cell count, unspecified: Secondary | ICD-10-CM | POA: Diagnosis not present

## 2023-12-23 HISTORY — PX: TRANSESOPHAGEAL ECHOCARDIOGRAM (CATH LAB): EP1270

## 2023-12-23 LAB — CBC
HCT: 28.2 % — ABNORMAL LOW (ref 36.0–46.0)
Hemoglobin: 8.8 g/dL — ABNORMAL LOW (ref 12.0–15.0)
MCH: 26.9 pg (ref 26.0–34.0)
MCHC: 31.2 g/dL (ref 30.0–36.0)
MCV: 86.2 fL (ref 80.0–100.0)
Platelets: 327 10*3/uL (ref 150–400)
RBC: 3.27 MIL/uL — ABNORMAL LOW (ref 3.87–5.11)
RDW: 17 % — ABNORMAL HIGH (ref 11.5–15.5)
WBC: 12.6 10*3/uL — ABNORMAL HIGH (ref 4.0–10.5)
nRBC: 0 % (ref 0.0–0.2)

## 2023-12-23 LAB — GLUCOSE, CAPILLARY
Glucose-Capillary: 173 mg/dL — ABNORMAL HIGH (ref 70–99)
Glucose-Capillary: 127 mg/dL — ABNORMAL HIGH (ref 70–99)
Glucose-Capillary: 189 mg/dL — ABNORMAL HIGH (ref 70–99)
Glucose-Capillary: 265 mg/dL — ABNORMAL HIGH (ref 70–99)

## 2023-12-23 LAB — ECHO TEE: Area-P 1/2: 2.56 cm2

## 2023-12-23 SURGERY — TRANSESOPHAGEAL ECHOCARDIOGRAM (TEE) (CATHLAB)
Anesthesia: Monitor Anesthesia Care

## 2023-12-23 MED ORDER — PHENOL 1.4 % MT LIQD
1.0000 | OROMUCOSAL | Status: DC | PRN
  Administered 2023-12-23 – 2023-12-24 (×3): 1 via OROMUCOSAL

## 2023-12-23 MED ORDER — CARVEDILOL 6.25 MG PO TABS
9.3750 mg | ORAL_TABLET | Freq: Two times a day (BID) | ORAL | Status: DC
  Administered 2023-12-23 – 2023-12-27 (×8): 9.375 mg via ORAL
  Filled 2023-12-23 (×8): qty 1

## 2023-12-23 MED ORDER — SODIUM CHLORIDE 0.9 % IV SOLN: INTRAVENOUS | Status: DC | PRN

## 2023-12-23 MED ORDER — PROPOFOL 500 MG/50ML IV EMUL
INTRAVENOUS | Status: DC | PRN
  Administered 2023-12-23: 100 ug/kg/min via INTRAVENOUS
  Administered 2023-12-23 (×5): 20 mg via INTRAVENOUS

## 2023-12-23 MED ORDER — TORSEMIDE 20 MG PO TABS
60.0000 mg | ORAL_TABLET | Freq: Every day | ORAL | Status: DC
  Administered 2023-12-24 – 2023-12-27 (×4): 60 mg via ORAL
  Filled 2023-12-23 (×4): qty 3

## 2023-12-23 MED ORDER — EZETIMIBE 10 MG PO TABS
10.0000 mg | ORAL_TABLET | Freq: Every day | ORAL | Status: DC
  Administered 2023-12-23 – 2023-12-27 (×5): 10 mg via ORAL
  Filled 2023-12-23 (×5): qty 1

## 2023-12-23 MED ORDER — MENTHOL 3 MG MT LOZG
1.0000 | LOZENGE | OROMUCOSAL | Status: DC | PRN
  Filled 2023-12-23 (×2): qty 9

## 2023-12-23 MED ORDER — LIDOCAINE 2% (20 MG/ML) 5 ML SYRINGE
INTRAMUSCULAR | Status: DC | PRN
  Administered 2023-12-23: 60 mg via INTRAVENOUS

## 2023-12-23 NOTE — CV Procedure (Signed)
     TRANSESOPHAGEAL ECHOCARDIOGRAM   NAME:  [name]   MRN: [id number] DOB:  1960-12-08   ADMIT DATE: 12/14/2023  INDICATIONS: Bacteremia  PROCEDURE:   Informed consent was obtained prior to the procedure. The risks, benefits and alternatives for the procedure were discussed and the patient comprehended these risks.  Risks include, but are not limited to, cough, sore throat, vomiting, nausea, somnolence, esophageal and stomach trauma or perforation, bleeding, low blood pressure, aspiration, pneumonia, infection, trauma to the teeth and death.    After a procedural time-out, the oropharynx was anesthetized and the patient was sedated by the anesthesia service. The transesophageal probe was inserted in the esophagus and stomach without difficulty and multiple views were obtained. Anesthesia was monitored by Adelene Homer, CRNA.    COMPLICATIONS:    There were no immediate complications.  FINDINGS:  No vegetation seen.  S/p mitraclip.  Severe MR.   Carson Clara MD [facility]  [address] [place], [place] [address] [phone]   12:31 PM

## 2023-12-23 NOTE — Progress Notes (Signed)
 Inpatient Rehab Admissions Coordinator:   Per Dr. Farrel Hones, TEE complete and no vegetations.  ID to weigh in on duration of therapy.  PT to see again today.  I will send for HTA prior auth request this afternoon.   Loye Rumble, PT, DPT Admissions Coordinator [phone] 12/23/23  1:34 PM

## 2023-12-23 NOTE — Progress Notes (Signed)
 Physical Therapy Treatment Patient Details Name: Dana [name] MRN: [id number] DOB: 02-Jan-1961 Today's Date: 12/23/2023   History of Present Illness 63 y.o. female admitted  5/31 with Severe sepsis, present on admission  MRSA bacteremia,  Left ankle post op infection,  Bilateral pyelonephritis. 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 PMH insulin -dependent diabetes mellitus, CKD stage IV, CAD, chronic hypoxic respiratory failure, OSA on CPAP, chronic HFmrEF, depression, anxiety, chronic pain, and left trimalleolar ankle fracture/dislocation status postoperative repair on 11/13/2023    PT Comments  Pt made nice mobility gains today.  Emphasis on transitions to EOB from flat bed with rail, scooting, sit to stand progressing to CGA, pre-gait, squat pivot transfer with mod assist, BKA exercise and education.     If plan is discharge home, recommend the following: A little help with walking and/or transfers;A little help with bathing/dressing/bathroom;Assistance with cooking/housework;Assist for transportation;Help with stairs or ramp for entrance   Can travel by private vehicle        Equipment Recommendations  Wheelchair (measurements PT);Wheelchair cushion (measurements PT)    Recommendations for Other Services Rehab consult     Precautions / Restrictions Precautions Precautions: Fall Recall of Precautions/Restrictions: Intact     Mobility  Bed Mobility Overal bed mobility: Needs Assistance Bed Mobility: Supine to Sit     Supine to sit: Used rails, Min assist     General bed mobility comments: scooted without assist    Transfers Overall transfer level: Needs assistance Equipment used: Rolling walker (2 wheels), None Transfers: Sit to/from Stand, Bed to chair/wheelchair/BSC Sit to Stand: Min assist, Contact guard assist (x7)     Squat pivot transfers: Mod assist     General transfer comment: cues for technique, lessening assist as pt's confidence improved in standing, moderate assist helping  w/shift forward for squat pivot.    Ambulation/Gait                   Stairs             Wheelchair Mobility     Tilt Bed    Modified Rankin (Stroke Patients Only)       Balance     Sitting balance-Leahy Scale: Good       Standing balance-Leahy Scale: Poor Standing balance comment: reliant on the RW                            Communication Communication Communication: No apparent difficulties  Cognition Arousal: Alert Behavior During Therapy: WFL for tasks assessed/performed   PT - Cognitive impairments: No apparent impairments                         Following commands: Intact      Cueing Cueing Techniques: Verbal cues, Tactile cues  Exercises General Exercises - Lower Extremity Ankle Circles/Pumps: AROM, 10 reps, Supine, Right Quad Sets: Strengthening, Both, 10 reps, Supine Gluteal Sets: Strengthening, Both, 10 reps, Supine Hip ABduction/ADduction: Strengthening, 10 reps, Supine, Left Amputee Exercises Hip Extension: AROM, Strengthening, 10 reps, Left Hip Flexion/Marching: AROM, Left, 10 reps, Supine Knee Flexion: AROM, Left, Seated, 10 reps Straight Leg Raises: AROM, Strengthening, Both, 10 reps    General Comments General comments (skin integrity, edema, etc.): vss      Pertinent Vitals/Pain Pain Assessment Pain Assessment: Faces Pain Score: 4  Faces Pain Scale: Hurts little more Pain Intervention(s): Monitored during session    Home Living  Prior Function            PT Goals (current goals can now be found in the care plan section) Acute Rehab PT Goals PT Goal Formulation: With patient Time For Goal Achievement: 01/02/24 Potential to Achieve Goals: Good Progress towards PT goals: Progressing toward goals    Frequency    Min 2X/week      PT Plan      Co-evaluation              AM-PAC PT "6 Clicks" Mobility   Outcome Measure  Help needed turning  from your back to your side while in a flat bed without using bedrails?: A Little Help needed moving from lying on your back to sitting on the side of a flat bed without using bedrails?: A Little Help needed moving to and from a bed to a chair (including a wheelchair)?: A Lot Help needed standing up from a chair using your arms (e.g., wheelchair or bedside chair)?: A Little Help needed to walk in hospital room?: Total Help needed climbing 3-5 steps with a railing? : Total 6 Click Score: 13    End of Session Equipment Utilized During Treatment: Gait belt Activity Tolerance: Patient tolerated treatment well Patient left: with call bell/phone within reach;in chair;with chair alarm set;Other (comment) Nurse Communication: Mobility status;Need for lift equipment PT Visit Diagnosis: Other abnormalities of gait and mobility (R26.89);Muscle weakness (generalized) (M62.81);Pain Pain - Right/Left: Left Pain - part of body: Leg     Time: 0981-1914 PT Time Calculation (min) (ACUTE ONLY): 52 min  Charges:    $Therapeutic Exercise: 8-22 mins $Therapeutic Activity: 23-37 mins PT General Charges $$ ACUTE PT VISIT: 1 Visit                     12/23/2023  [name], PT Acute Rehabilitation Services [phone]  (office)   Durell Gilding Elzie Knisley 12/23/2023, 4:42 PM

## 2023-12-23 NOTE — Consult Note (Addendum)
 Progress Note  Patient Name: Dana [name] Date of Encounter: 12/23/2023 [facility] Cardiologist: Jules Oar, MD   Interval Summary    63 year old female with PMH of chronic diastolic heart failure, CAD required multiple PCI's, severe mitral regurgitation s/p mTEER 2022, paroxysmal atrial fibrillation status post ablation 2017 (not on anticoagulation), PEA arrest 2022, PAD status post left toes amputation, CKD stage IV, type 1 diabetes, hypertension, hyperlipidemia, OSA on CPAP,who is currently admitted for MRSA bacteremia 2/2 left ankle surgical wound infection and E. coli pyelonephritis. She is now s/p left BKA 12/18/23. On IV antibiotic. Cardiology is following for CHF. TEE is planned today.   Patient returned from TEE, offers no complaints. She denied any chest pain or SOB. She states her pain is controlled. She states she is planned for [facility] at some point. Sh is urinating a ton.  Vital Signs Vitals:   12/23/23 0739 12/23/23 0940 12/23/23 0945 12/23/23 1022  BP: (!) 135/59 (!) 126/49 (!) 111/56 (!) 147/56  Pulse: 65 66 67 64  Resp: 14 11 (!) 22 19  Temp: 98.1 F (36.7 C)   98.5 F (36.9 C)  TempSrc: Temporal   Oral  SpO2: 95% 98% 93% 95%  Weight:      Height:        Intake/Output Summary (Last 24 hours) at 12/23/2023 1035 Last data filed at 12/23/2023 2952 Gross per 24 hour  Intake 150 ml  Output 2600 ml  Net -2450 ml      12/14/2023    2:00 AM 12/14/2023    1:00 AM 11/12/2023    4:16 AM  Last 3 Weights  Weight (lbs) 216 lb 0.8 oz 227 lb 11.8 oz 214 lb 0.4 oz  Weight (kg) 98 kg 103.3 kg 97.08 kg      Telemetry/ECG  Telemetry today showed sinus rhythm, with brief periods of atrial tachcyardia - Personally Reviewed  Physical Exam  GEN: No acute distress.   Neck: No JVD Cardiac: RRR, soft systolic murmur grade I Respiratory: Clear to auscultation bilaterally. On room air GI: Soft, nontender, non-distended  MS: s/p L BKA, no RLE edema   Assessment &  Plan   Acute on chronic combined CHF RV failure  - Echo 12/24 EF 55-60%, LV with no RWMA, RV normal, mild MR MG 5 mmHg  - Echo 08/27/23 showed LVEF 40-45%, unable evaluate diastolic function, RV not well visualized, S/p mitraclip. Mild residual MR with MG 4.3 mmHg with average HR 69 bpm.  - Echo 12/15/23 showed LVEF 40-45%, RV pressure and volume overload, low normal RV, PASP elevated 65.7 mmHg, S/P mitral clip, mild to moderate MR, no valvular stenosis (MG 5 mmHg, HR 72bpm), mod TR, mod  PR, IVC dilated  - on PTA torsemide  60 mg daily + 20 KCL daily, initially given IV Lasix  60mg  BID, due to rising Cr, dosing reduced to IV Lasix  60mg  daily on 12/20/23, Cr 2.22 today, near baseline 2-2.5, Net - 11.6L since admission, no weight tracked,  clinically euvolemic, likely can transition to PO torsemide  60mg  daily tomrrow  - GDMT: on PTA coreg  9.375mg  BID, will re-adjust dose according to her home dose; Not on SGLT2i due to DM1 and recurrent UTIs; Not candidate for ARNI/MRA due to advanced renal disease   MRSA bacteremia  Left ankle surgical wound infection s/p left BKA - TEE planned today, on IV daptomycin  per ID, defer further management to primary team   CAD with multiple PCIs  - most recent intervention was 07/18/23 for  successful scoring balloon angioplasty of the proximal LAD stented segment - on PTA medical therapy ASA + Brilinta , Brilinta  was interrupted for left BKA and has resumed on 12/19/23 - continue PTA coreg , allergic to statin, will add back PTA zetia  10mg  today, resume repatha  at time of discharge; also on Imdur  30mg  at home, if BP stable may add back  - no acute issues   MR s/p failed mTEER  - Severe central MR on TEE on 10/22.   - S/p TEER 11/22 - two clips placed, one became entangled and had to be deployed. - Moderate residual MR since echo 11/22, stable mild to mod MR on Echo 12/15/23   Pyelonephritis  CKD IV Type 1 DM  Depression with anxiety  OSA - per IM        For questions  or updates, please contact  [facility] Please consult [email or web address] for contact info under       Signed, Xika Zhao, NP   Attending Note:   The patient was seen and examined.  Agree with assessment and plan as noted above.  Changes made to the above note as needed.  Patient seen and independently examined with Ronda Cocks, NP .   We discussed all aspects of the encounter. I agree with the assessment and plan as stated above.     Acute on chronic combined CHF . Continue diuresis .   Creatinine is 2.22   Will resume home Torsemide  60 mg a day tomorrow   2.  Severe MR .    The TEE shows no evidence of vegetation.  She has persistent severe MR   She will follow up with Dr. Julane Ny in the CHF clinic   2.  CAD :  she had a successful PCI in Jan. 2025.   Had her Brilinta  inturrputed for her left BKA.   No obvious post op complications       I have spent a total of 40 minutes with patient reviewing hospital  notes , telemetry, EKGs, labs and examining patient as well as establishing an assessment and plan that was discussed with the patient.  > 50% of time was spent in direct patient care.    [place], Marieta Shorten., MD, [facility] 12/23/2023, 12:31 PM [address],  [address] Office [phone] Pager [phone]

## 2023-12-23 NOTE — Anesthesia Postprocedure Evaluation (Signed)
 Anesthesia Post Note  Patient: Dana [name]  Procedure(s) Performed: TRANSESOPHAGEAL ECHOCARDIOGRAM     Patient location during evaluation: PACU Anesthesia Type: MAC Level of consciousness: awake Pain management: pain level controlled Vital Signs Assessment: post-procedure vital signs reviewed and stable Respiratory status: spontaneous breathing, nonlabored ventilation and respiratory function stable Cardiovascular status: stable and blood pressure returned to baseline Postop Assessment: no apparent nausea or vomiting Anesthetic complications: no   There were no known notable events for this encounter.  Last Vitals:  Vitals:   12/23/23 0945 12/23/23 1022  BP: (!) 111/56 (!) 147/56  Pulse: 67 64  Resp: (!) 22 19  Temp:  36.9 C  SpO2: 93% 95%    Last Pain:  Vitals:   12/23/23 1039  TempSrc:   PainSc: 8                  Conard Decent

## 2023-12-23 NOTE — Progress Notes (Addendum)
 [facility]    Date of Admission:  12/14/2023   Total days of antibiotics 11   ID: Dana [name] is a 63 y.o. female with  MRSA complicated bacteremia with left ankle osteomyelitis s/p BKA, and ecoli pyelonephritis Principal Problem:   Surgical site infection Active Problems:   Chronic combined systolic (congestive) and diastolic (congestive) heart failure (HCC)   Chronic respiratory failure with hypoxia (HCC)   OSA on CPAP   Uncontrolled type 1 diabetes mellitus with hyperglycemia, with long-term current use of insulin  (HCC)   Generalized anxiety disorder   CKD stage 4 due to type 1 diabetes mellitus (HCC) - baseline 2.2 - 2.4   Coronary artery disease involving native coronary artery of native heart with angina pectoris (HCC)   Acute on chronic combined systolic and diastolic CHF (congestive heart failure) (HCC)   MRSA bacteremia   Depression with anxiety   S/P BKA (below knee amputation), left (HCC)    Subjective: Afebrile TEE this morning ruled out endocarditis; not having significant pain to left bka, no phantom pain  Medications:   allopurinol   50 mg Oral Daily   aspirin  EC  81 mg Oral Daily   carvedilol   9.375 mg Oral BID WC   Chlorhexidine  Gluconate Cloth  6 each Topical Daily   escitalopram   10 mg Oral Daily   ezetimibe   10 mg Oral Daily   gabapentin   300 mg Oral TID   heparin  injection (subcutaneous)  5,000 Units Subcutaneous Q8H   insulin  aspart  0-6 Units Subcutaneous TID AC & HS   latanoprost   1 drop Both Eyes QHS   pantoprazole   40 mg Oral Daily   polyethylene glycol  17 g Oral Daily   senna  2 tablet Oral QHS   sodium chloride  flush  3 mL Intravenous Q12H   ticagrelor   90 mg Oral BID   [START ON 12/24/2023] torsemide   60 mg Oral Daily    Objective: Vital signs in last 24 hours: Temp:  [97.9 F (36.6 C)-98.5 F (36.9 C)] 98.5 F (36.9 C) (06/09 1022) Pulse Rate:  [60-70] 64 (06/09 1022) Resp:  [11-22] 19 (06/09 1022) BP:  (111-151)/(47-63) 147/56 (06/09 1022) SpO2:  [92 %-100 %] 95 % (06/09 1022) FiO2 (%):  [28 %] 28 % (06/09 0012) Physical Exam  Constitutional:  oriented to person, place, and time. appears well-developed and well-nourished. No distress.  HENT: [place]/AT, PERRLA, no scleral icterus Mouth/Throat: Oropharynx is clear and moist. No oropharyngeal exudate.  Cardiovascular: Normal rate, regular rhythm and normal heart sounds. Exam reveals no gallop and no friction rub.  No murmur heard.  Pulmonary/Chest: Effort normal and breath sounds normal. No respiratory distress.  has no wheezes.  Neck = supple, no nuchal rigidity Abdominal: Soft. Bowel sounds are normal.  exhibits no distension. There is no tenderness.  Ext: left bka wrapped Psychiatric: a normal mood and affect.  behavior is normal.    Lab Results Recent Labs    12/21/23 0834 12/22/23 0404 12/23/23 0500  WBC  --   --  12.6*  HGB  --   --  8.8*  HCT  --   --  28.2*  NA 134* 135  --   K 4.6 4.6  --   CL 101 101  --   CO2 24 24  --   BUN 63* 61*  --   CREATININE 2.32* 2.22*  --     Microbiology: reviewed Studies/Results: ECHO TEE Result Date: 12/23/2023  TRANSESOPHOGEAL ECHO REPORT   Patient Name:   Dana [name] Date of Exam: 12/23/2023 Medical Rec #:  [id number]      Height:       65.0 in Accession #:    [id number]     Weight:       216.0 lb Date of Birth:  1961-06-14      BSA:          2.044 m Patient Age:    62 years       BP:           144/62 mmHg Patient Gender: F              HR:           79 bpm. Exam Location:  Inpatient Procedure: Transesophageal Echo, 3D Echo, Cardiac Doppler and Color Doppler            (Both Spectral and Color Flow Doppler were utilized during            procedure). Indications:     Endocarditis  History:         Patient has prior history of Echocardiogram examinations, most                  recent 12/15/2023. CHF, CAD, Mitral Valve Disease,                  Arrythmias:Atrial Fibrillation; Risk  Factors:Hypertension,                  Diabetes and Sleep Apnea.                   Mitral Valve: Mitra-Clip valve is present in the mitral                  position.  Sonographer:     Astrid Blamer Referring Phys:  [id number] BRIDGETTE CHRISTOPHER Diagnosing Phys: Carson Clara MD PROCEDURE: After discussion of the risks and benefits of a TEE, an informed consent was obtained from the patient. The transesophogeal probe was passed without difficulty through the esophogus of the patient. Sedation performed by different physician. The patient was monitored while under deep sedation. Anesthestetic sedation was provided intravenously by Anesthesiology: 305mg  of Propofol , 60mg  of Lidocaine . The patient developed no complications during the procedure.  IMPRESSIONS  1. Left ventricular ejection fraction, by estimation, is 50 to 55%. The left ventricle has low normal function.  2. Right ventricular systolic function is normal. The right ventricular size is mildly enlarged.  3. Left atrial size was mildly dilated. No left atrial/left atrial appendage thrombus was detected.  4. The aortic valve is tricuspid. Aortic valve regurgitation is not visualized. No aortic stenosis is present.  5. 3D performed of the mitral valve, 3D performed of the aortic valve and 3D performed of the atrial septum  6. Evidence of atrial level shunting detected by color flow Doppler. Known iatrogenic ASD after mitraclip with left to right flow.  7. There is a Mitra-Clip present in the mitral position.     Severe mitral valve regurgitation. No evidence of mitral stenosis. The mean mitral valve gradient is 4.0 mmHg with average heart rate of 65 bpm. Blunting but no clear reversal of pulmonary vein systolic flow. Thre are 2 MR jets with 3D VCA 0.35cm^2 for larger jet and 0.11cm^2 for smaller jet, with total 3D VCA 0.46cm^2 suggesting severe MR. Conclusion(s)/Recommendation(s): No evidence of vegetation/infective endocarditis on this transesophageael  echocardiogram. FINDINGS  Left Ventricle:  Left ventricular ejection fraction, by estimation, is 50 to 55%. The left ventricle has low normal function. The left ventricular internal cavity size was normal in size. Right Ventricle: The right ventricular size is mildly enlarged. No increase in right ventricular wall thickness. Right ventricular systolic function is normal. Left Atrium: Left atrial size was mildly dilated. No left atrial/left atrial appendage thrombus was detected. Right Atrium: Right atrial size was normal in size. Pericardium: There is no evidence of pericardial effusion. Mitral Valve: The mitral valve has been repaired/replaced. Severe mitral valve regurgitation. There is a Mitra-Clip present in the mitral position. No evidence of mitral valve stenosis. MV peak gradient, 14.0 mmHg. The mean mitral valve gradient is 4.0 mmHg with average heart rate of 65 bpm. Tricuspid Valve: The tricuspid valve is normal in structure. Tricuspid valve regurgitation is trivial. Aortic Valve: The aortic valve is tricuspid. Aortic valve regurgitation is not visualized. No aortic stenosis is present. Pulmonic Valve: The pulmonic valve was not well visualized. Pulmonic valve regurgitation is trivial. Aorta: The aortic root and ascending aorta are structurally normal, with no evidence of dilitation. IAS/Shunts: Evidence of atrial level shunting detected by color flow Doppler. Additional Comments: 3D was performed not requiring image post processing on an independent workstation and was abnormal. MITRAL VALVE MV Area (PHT): 2.56 cm MV Peak grad:  14.0 mmHg MV Mean grad:  4.0 mmHg MV Vmax:       1.87 m/s MV Vmean:      90.5 cm/s MV Decel Time: 296 msec MV E velocity: 178.00 cm/s Carson Clara MD Electronically signed by Carson Clara MD Signature Date/Time: 12/23/2023/12:30:49 PM    Final    EP STUDY Result Date: 12/23/2023 See surgical note for result.    Assessment/Plan: Complicated MRSA bacteremia =  since left bka we now have source control of infection. Currently on daptomycin . Plan to continue daptomycin  through 12/28/2023. Currently receiving daptomycin  700mg  Q 48hr, next doses would be 6/10, 6/12 and 6/14.   Recent BKA = defer wound care /management to orthopedics   Acute on chronic CkD= improved now closer to her baseline of 2.2  Leukocytosis = slowly improving. Continue to monitor cbc to see that it is returning back to baseline  Will sign off.  [facility] for Infectious Diseases Pager: [phone]  12/23/2023, 1:39 PM

## 2023-12-23 NOTE — Transfer of Care (Signed)
 Immediate Anesthesia Transfer of Care Note  Patient: Dana [name]  Procedure(s) Performed: TRANSESOPHAGEAL ECHOCARDIOGRAM  Patient Location: CATH LAB HOLDING BED 21  Anesthesia Type:MAC  Level of Consciousness: awake, alert , oriented, patient cooperative, and responds to stimulation  Airway & Oxygen  Therapy: Patient Spontanous Breathing and Patient connected to nasal cannula oxygen   Post-op Assessment: Report given to RN and Post -op Vital signs reviewed and stable  Post vital signs: Reviewed and stable  Last Vitals:  Vitals Value Taken Time  BP    Temp    Pulse 66 12/23/23 0940  Resp 11 12/23/23 0940  SpO2 98 % 12/23/23 0940  Vitals shown include unfiled device data.  Last Pain:  Vitals:   12/23/23 0750  TempSrc:   PainSc: 0-No pain      Patients Stated Pain Goal: 0 (12/22/23 2119)  Complications: There were no known notable events for this encounter.

## 2023-12-23 NOTE — Interval H&P Note (Signed)
 History and Physical Interval Note:  12/23/2023 8:41 AM  Dana [name]  has presented today for surgery, with the diagnosis of bacteremia.  The various methods of treatment have been discussed with the patient and family. After consideration of risks, benefits and other options for treatment, the patient has consented to  Procedure(s): TRANSESOPHAGEAL ECHOCARDIOGRAM (N/A) as a surgical intervention.  The patient's history has been reviewed, patient examined, no change in status, stable for surgery.  I have reviewed the patient's chart and labs.  Questions were answered to the patient's satisfaction.     Wendie Hamburg

## 2023-12-23 NOTE — Progress Notes (Signed)
 PROGRESS NOTE    [name]  VZD:[id number] DOB: [birth year] DOA: 12/14/2023 PCP: Mercy Stall, MD  Subjective: Pt seen and examined.  Pt had TEE today. Negative for endocarditis. Back on diabetic diet. Awaiting CIR   Hospital Course: HPI: Dana [name] is a pleasant 63 y.o. female with medical history significant for insulin -dependent diabetes mellitus, CKD stage IV, CAD, chronic hypoxic respiratory failure, OSA on CPAP, chronic HFmrEF, depression, anxiety, chronic pain, and left trimalleolar ankle fracture/dislocation status postoperative repair on 11/13/2023 who presented to the [facility] ED the night of 12/12/2023 with fevers, dysuria, and flank pain.   [facility] ED and Hospital Course: Upon arrival to the ED, patient is found to be febrile.  She had a leukocytosis 24,700.  CT of the abdomen and pelvis demonstrates bladder wall thickening with surrounding fat stranding.  Blood and urine cultures were collected, she was given IV fluids and broad-spectrum antibiotics, and was admitted to the hospitalist service.   Shortly after admission, it was noted that she had purulent drainage from left heel wound as well as open wounds at the medial aspect of the left ankle.  Orthopedic surgery at [facility] [place], [place]) was consulted and recommended medical admission to [facility] where they will consult.   Blood cultures at [facility] are growing MRSA.  Significant Events: Admitted 12/14/2023 for surgical site infection left ankle with MRSA 12-18-2023 left BKA  Admission Labs: Na 134, K 4.3, CO2 of 22, BUN 50, Scr 2.25, glu 137 T. Prot 6.5, alb 2.1, AST 11, ALT 11, alk phos 144  Admission Imaging Studies: CXR Prominent cardiac silhouette and vascular congestion  XR left ankle Postop changes. Disrupted ankle mortise. Displaced fractures. Soft tissue swelling CT renal stone No hydronephrosis. 2. Mild nonspecific bilateral perinephric fat stranding, mildly increased.  Correlate clinically for infection. 3. Enlarged left ovary with surrounding inflammatory stranding and fluid, a new finding. Findings may be related to ovarian torsion or infection. Recommend further evaluation with pelvic ultrasound. 4. Increasing body wall edema and presacral edema. 5. Trace bilateral pleural effusions. 6. Cardiomegaly. 7. Mild splenomegaly. 8. Aortic atherosclerosis Pelvic U/S Status post hysterectomy. 2. Nonvisualization of the ovaries bilaterally. If indicated, pelvic MRI examination with contrast may be helpful for further evaluation. 3. Trace simple appearing free fluid within the right adnexa, nonspecific  Significant Labs:   Significant Imaging Studies: 12-15-2023 LE U/S negative for DVT 12-15-2023 echo LVEF 40%, hx of mitral clip 12-16-2023 V/Q scan No evidence of acute pulmonary embolism on perfusion scintigraphy by PISAPED criteria. Mildly heterogeneous left perihilar perfusion, similar to previous examination  Antibiotic Therapy: Anti-infectives (From admission, onward)    Start     Dose/Rate Route Frequency Ordered Stop   12/20/23 1000  DAPTOmycin  (CUBICIN ) IVPB 700 mg/100mL premix  Status:  Discontinued        700 mg 200 mL/hr over 30 Minutes Intravenous Daily 12/19/23 1009 12/20/23 0821   12/20/23 1000  DAPTOmycin  (CUBICIN ) IVPB 700 mg/137mL premix        700 mg 200 mL/hr over 30 Minutes Intravenous Every 48 hours 12/20/23 0821     12/18/23 0600  vancomycin  (VANCOREADY) IVPB 1250 mg/250 mL  Status:  Discontinued        1,250 mg 166.7 mL/hr over 90 Minutes Intravenous Every 48 hours 12/17/23 1452 12/19/23 1009   12/18/23 0600  ceFAZolin  (ANCEF ) IVPB 2g/100 mL premix  Status:  Discontinued        2 g 200 mL/hr over 30 Minutes Intravenous  On call to O.R. 12/17/23 2121 12/18/23 1254   12/16/23 1400  ceFAZolin  (ANCEF ) IVPB 1 g/50 mL premix        1 g 100 mL/hr over 30 Minutes Intravenous Every 8 hours 12/16/23 0937 12/20/23 0015   12/16/23 0600  Vancomycin   (VANCOCIN ) 1,250 mg in sodium chloride  0.9 % 250 mL IVPB  Status:  Discontinued        1,250 mg 166.7 mL/hr over 90 Minutes Intravenous Every 48 hours 12/14/23 1009 12/17/23 1452   12/14/23 1800  piperacillin -tazobactam (ZOSYN ) IVPB 3.375 g  Status:  Discontinued        3.375 g 12.5 mL/hr over 240 Minutes Intravenous Every 8 hours 12/14/23 1710 12/16/23 0940   12/14/23 0600  vancomycin  (VANCOCIN ) IVPB 1000 mg/200 mL premix  Status:  Discontinued        1,000 mg 200 mL/hr over 60 Minutes Intravenous Every 24 hours 12/14/23 0210 12/14/23 1009   12/14/23 0215  vancomycin  (VANCOCIN ) IVPB 1000 mg/200 mL premix  Status:  Discontinued        1,000 mg 200 mL/hr over 60 Minutes Intravenous  Once 12/14/23 0122 12/14/23 0130   12/14/23 0215  cefTRIAXone  (ROCEPHIN ) 2 g in sodium chloride  0.9 % 100 mL IVPB  Status:  Discontinued        2 g 200 mL/hr over 30 Minutes Intravenous Daily at bedtime 12/14/23 0155 12/14/23 1703       Procedures: 12-18-2023 left BKA 12-23-2023 TEE negative for endocarditis  Consultants: Infectious Disease Orthopedics Psych Cardiology    Assessment and Plan: * Surgical site infection 12-20-2023 remains on IV Daptomycin  for her MRSA bacteremia. Needs TEE on Monday. Then hopefully ins authorization for CIR.  12-21-2023 remains on IV Dapto. After her TEE on Monday, ID service to decide remaining duration of her abx therapy.  12-22-2023 NPO after MN. For TEE tomorrow.  12-23-2023 Pt had TEE today. Negative for endocarditis. ID says that she will only need a few more days of IV dapto.  S/P BKA (below knee amputation), left (HCC) 12-20-2023 continue with IV dilaudid  prn. POD #2  12-21-2023 still using prn IV dilaudid . Continue  IV dilaudid  for now. POD #3  12-22-2023 still using prn IV dilaudid . POD #4. Continue IV dilaudid  for 1-2 more days.  12-23-2023 pt states she has stopped using IV dilaudid . Will DC it off her MAR. Continue with po oxycodone . POD #5  MRSA  bacteremia 12-20-2023 remains on IV Daptomycin  for her MRSA bacteremia. Needs TEE on Monday. Then hopefully ins authorization for CIR.  12-21-2023 remains on IV Dapto. After her TEE on Monday, ID service to decide remaining duration of her abx therapy.   12-22-2023 NPO after MN. For TEE tomorrow.   12-23-2023 Pt had TEE today. Negative for endocarditis. ID says that she will only need a few more days of IV dapto.  Depression with anxiety 12-20-2023 psych has signed off.  12-21-2023 stable.  12-22-2023 stable.  12-23-2023 stable  Coronary artery disease involving native coronary artery of native heart with angina pectoris (HCC) 12-20-2023 stable.  12-21-2023 stable.  12-22-2023 stable.  12-23-2023 stable  CKD stage 4 due to type 1 diabetes mellitus (HCC) - baseline 2.2 - 2.4 12-20-2023 stable Scr. 2.44  12-21-2023 stable. BMP in AM.  12-22-2023 stable. Scr 2.22 today.  12-23-2023 stable  Generalized anxiety disorder 12-20-2023 psych has signed off.  12-21-2023 stable.  12-22-2023 stable.  12-23-2023 stable.  Uncontrolled type 1 diabetes mellitus with hyperglycemia, with long-term current use of insulin  (  HCC) 12-20-2023 had hypoglycemia last night. Long acting insulin  stopped. Changed to lowest dose SSI.  Last A1c of 10.2% in April, 2025. Monitor for DKA.  HgBA1c and mean plasma glucose: Lab Results  Component Value Date/Time   HGBA1C 10.2 (H) 11/12/2023 06:19 AM   MPG 246.04 11/12/2023 06:19 AM   12-21-2023 CBG stable. Monitor for DKA. Last dose long acting insulin (lantus ) at 2 pm on 12-19-2023.  Monitor to make sure she is not slipping into DKA.  12-22-2023 stable.  12-23-2023 CBG stable off long acting insulin . No evidence of DKA.  OSA on CPAP 12-20-2023 stable.  12-21-2023 stable.  12-22-2023 stable.  12-23-2023 stable.  Chronic respiratory failure with hypoxia (HCC) 12-20-2023 stable.  12-21-2023 stable.  12-22-2023 stable. On  RA.  12-23-2023 on RA.  Chronic combined systolic (congestive) and diastolic (congestive) heart failure (HCC) 12-20-2023 cardiology has decreased IV lasix  to 60 mg daily.  12-21-2023 stable. Remains on 60 mg IV lasix  per day.  12-22-2023 stable. Cards has kept her on 60 mg IV lasix  per day.  12-23-2023 cards has changed to po demadex  60 mg daily. Remains on coreg  9.375 mg bid  UTI (urinary tract infection)-resolved as of 12/20/2023 Pt completed 5 days of IV ancef  for her UTI.   DVT prophylaxis: heparin  injection 5,000 Units Start: 12/14/23 1400 SCDs Start: 12/14/23 0119    Code Status: Full Code Family Communication: no family at bedside Disposition Plan: CIR vs home Reason for continuing need for hospitalization: stable for transfer to CIR  Objective: Vitals:   12/23/23 0739 12/23/23 0940 12/23/23 0945 12/23/23 1022  BP: (!) 135/59 (!) 126/49 (!) 111/56 (!) 147/56  Pulse: 65 66 67 64  Resp: 14 11 (!) 22 19  Temp: 98.1 F (36.7 C)   98.5 F (36.9 C)  TempSrc: Temporal   Oral  SpO2: 95% 98% 93% 95%  Weight:      Height:        Intake/Output Summary (Last 24 hours) at 12/23/2023 1330 Last data filed at 12/23/2023 1100 Gross per 24 hour  Intake 150 ml  Output 2550 ml  Net -2400 ml   Filed Weights   12/14/23 0100 12/14/23 0200  Weight: 103.3 kg 98 kg    Examination:  Physical Exam Vitals and nursing note reviewed.  Constitutional:      Appearance: She is obese.  HENT:     Head: Normocephalic and atraumatic.  Cardiovascular:     Rate and Rhythm: Normal rate and regular rhythm.  Pulmonary:     Effort: Pulmonary effort is normal.     Breath sounds: Normal breath sounds.  Abdominal:     General: Bowel sounds are normal. There is no distension.     Palpations: Abdomen is soft.  Musculoskeletal:     Comments: Left BKA  Skin:    Capillary Refill: Capillary refill takes less than 2 seconds.  Neurological:     General: No focal deficit present.     Mental  Status: She is alert and oriented to person, place, and time.     Data Reviewed: I have personally reviewed following labs and imaging studies  CBC: Recent Labs  Lab 12/17/23 0409 12/18/23 0351 12/19/23 0433 12/20/23 0509 12/23/23 0500  WBC 14.9* 13.4* 17.3* 14.5* 12.6*  HGB 9.4* 9.5* 9.3* 8.4* 8.8*  HCT 30.0* 30.4* 30.7* 27.3* 28.2*  MCV 85.7 87.6 87.5 87.8 86.2  PLT 278 280 316 297 327   Basic Metabolic Panel: Recent Labs  Lab 12/18/23 0351 12/19/23 0433  12/20/23 0509 12/21/23 0834 12/22/23 0404  NA 135 133* 132* 134* 135  K 4.5 4.6 4.5 4.6 4.6  CL 99 97* 96* 101 101  CO2 25 25 27 24 24   GLUCOSE 80 238* 128* 138* 150*  BUN 57* 59* 60* 63* 61*  CREATININE 2.05* 2.25* 2.44* 2.32* 2.22*  CALCIUM  8.8* 8.5* 8.2* 8.2* 8.6*   GFR: Estimated Creatinine Clearance: 31.2 mL/min (A) (by C-G formula based on SCr of 2.22 mg/dL (H)). Cardiac Enzymes: Recent Labs  Lab 12/20/23 0509  CKTOTAL 8*   BNP (last 3 results) Recent Labs    07/13/23 1325 08/26/23 2233  BNP 212.4* 511.3*   CBG: Recent Labs  Lab 12/22/23 1107 12/22/23 1606 12/22/23 2102 12/23/23 0716 12/23/23 1055  GLUCAP 151* 187* 269* 173* 127*    Recent Results (from the past 240 hours)  Urine Culture (for pregnant, neutropenic or urologic patients or patients with an indwelling urinary catheter)     Status: Abnormal   Collection Time: 12/14/23  1:56 AM   Specimen: Urine, Clean Catch  Result Value Ref Range Status   Specimen Description URINE, CLEAN CATCH  Final   Special Requests NONE  Final   Culture (A)  Final    <10,000 COLONIES/mL INSIGNIFICANT GROWTH Performed at [facility], [address], [place], [place] [address]    Report Status 12/15/2023 FINAL  Final  Culture, blood (x 2)     Status: None   Collection Time: 12/14/23  2:14 AM   Specimen: BLOOD RIGHT ARM  Result Value Ref Range Status   Specimen Description BLOOD RIGHT ARM  Final   Special Requests   Final    BOTTLES DRAWN  AEROBIC AND ANAEROBIC Blood Culture adequate volume   Culture   Final    NO GROWTH 5 DAYS Performed at [facility], [address] [place], [place] [address]    Report Status 12/19/2023 FINAL  Final  Culture, blood (x 2)     Status: None   Collection Time: 12/14/23  2:14 AM   Specimen: BLOOD RIGHT HAND  Result Value Ref Range Status   Specimen Description BLOOD RIGHT HAND  Final   Special Requests   Final    BOTTLES DRAWN AEROBIC AND ANAEROBIC Blood Culture adequate volume   Culture   Final    NO GROWTH 5 DAYS Performed at [facility], [address] [place], [place] [address]    Report Status 12/19/2023 FINAL  Final  Surgical pcr screen     Status: None   Collection Time: 12/18/23  9:15 AM   Specimen: Nasal Mucosa; Nasal Swab  Result Value Ref Range Status   MRSA, PCR NEGATIVE NEGATIVE Final   Staphylococcus aureus NEGATIVE NEGATIVE Final    Comment: (NOTE) The Xpert SA Assay (FDA approved for NASAL specimens in patients 49 years of age and older), is one component of a comprehensive surveillance program. It is not intended to diagnose infection nor to guide or monitor treatment. Performed at [facility], [address] [place], [place] [address]      Radiology Studies: ECHO TEE Result Date: 12/23/2023    TRANSESOPHOGEAL ECHO REPORT   Patient Name:   MALYIAH FELLOWS Date of Exam: 12/23/2023 Medical Rec #:  [id number]      Height:       65.0 in Accession #:    [id number]     Weight:       216.0 lb Date of Birth:  04/19/1961      BSA:          2.044 m Patient Age:    62 years       BP:           144/62 mmHg Patient Gender: F              HR:           79 bpm. Exam Location:  Inpatient Procedure: Transesophageal Echo, 3D Echo, Cardiac Doppler and Color Doppler            (Both Spectral and Color Flow Doppler were utilized during            procedure). Indications:     Endocarditis  History:         Patient has prior history of Echocardiogram examinations, most                   recent 12/15/2023. CHF, CAD, Mitral Valve Disease,                  Arrythmias:Atrial Fibrillation; Risk Factors:Hypertension,                  Diabetes and Sleep Apnea.                   Mitral Valve: Mitra-Clip valve is present in the mitral                  position.  Sonographer:     Astrid Blamer Referring Phys:  [id number] BRIDGETTE CHRISTOPHER Diagnosing Phys: Carson Clara MD PROCEDURE: After discussion of the risks and benefits of a TEE, an informed consent was obtained from the patient. The transesophogeal probe was passed without difficulty through the esophogus of the patient. Sedation performed by different physician. The patient was monitored while under deep sedation. Anesthestetic sedation was provided intravenously by Anesthesiology: 305mg  of Propofol , 60mg  of Lidocaine . The patient developed no complications during the procedure.  IMPRESSIONS  1. Left ventricular ejection fraction, by estimation, is 50 to 55%. The left ventricle has low normal function.  2. Right ventricular systolic function is normal. The right ventricular size is mildly enlarged.  3. Left atrial size was mildly dilated. No left atrial/left atrial appendage thrombus was detected.  4. The aortic valve is tricuspid. Aortic valve regurgitation is not visualized. No aortic stenosis is present.  5. 3D performed of the mitral valve, 3D performed of the aortic valve and 3D performed of the atrial septum  6. Evidence of atrial level shunting detected by color flow Doppler. Known iatrogenic ASD after mitraclip with left to right flow.  7. There is a Mitra-Clip present in the mitral position.     Severe mitral valve regurgitation. No evidence of mitral stenosis. The mean mitral valve gradient is 4.0 mmHg with average heart rate of 65 bpm. Blunting but no clear reversal of pulmonary vein systolic flow. Thre are 2 MR jets with 3D VCA 0.35cm^2 for larger jet and 0.11cm^2 for smaller jet, with total 3D VCA 0.46cm^2 suggesting  severe MR. Conclusion(s)/Recommendation(s): No evidence of vegetation/infective endocarditis on this transesophageael echocardiogram. FINDINGS  Left Ventricle: Left ventricular ejection fraction, by estimation, is 50 to 55%. The left ventricle has low normal function. The left ventricular internal cavity size was normal in size. Right Ventricle: The right ventricular size is mildly enlarged. No increase in right ventricular wall thickness. Right ventricular systolic function is normal. Left Atrium: Left atrial size was mildly dilated. No  left atrial/left atrial appendage thrombus was detected. Right Atrium: Right atrial size was normal in size. Pericardium: There is no evidence of pericardial effusion. Mitral Valve: The mitral valve has been repaired/replaced. Severe mitral valve regurgitation. There is a Mitra-Clip present in the mitral position. No evidence of mitral valve stenosis. MV peak gradient, 14.0 mmHg. The mean mitral valve gradient is 4.0 mmHg with average heart rate of 65 bpm. Tricuspid Valve: The tricuspid valve is normal in structure. Tricuspid valve regurgitation is trivial. Aortic Valve: The aortic valve is tricuspid. Aortic valve regurgitation is not visualized. No aortic stenosis is present. Pulmonic Valve: The pulmonic valve was not well visualized. Pulmonic valve regurgitation is trivial. Aorta: The aortic root and ascending aorta are structurally normal, with no evidence of dilitation. IAS/Shunts: Evidence of atrial level shunting detected by color flow Doppler. Additional Comments: 3D was performed not requiring image post processing on an independent workstation and was abnormal. MITRAL VALVE MV Area (PHT): 2.56 cm MV Peak grad:  14.0 mmHg MV Mean grad:  4.0 mmHg MV Vmax:       1.87 m/s MV Vmean:      90.5 cm/s MV Decel Time: 296 msec MV E velocity: 178.00 cm/s Carson Clara MD Electronically signed by Carson Clara MD Signature Date/Time: 12/23/2023/12:30:49 PM    Final    EP  STUDY Result Date: 12/23/2023 See surgical note for result.   Scheduled Meds:  allopurinol   50 mg Oral Daily   aspirin  EC  81 mg Oral Daily   carvedilol   9.375 mg Oral BID WC   Chlorhexidine  Gluconate Cloth  6 each Topical Daily   escitalopram   10 mg Oral Daily   ezetimibe   10 mg Oral Daily   gabapentin   300 mg Oral TID   heparin  injection (subcutaneous)  5,000 Units Subcutaneous Q8H   insulin  aspart  0-6 Units Subcutaneous TID AC & HS   latanoprost   1 drop Both Eyes QHS   pantoprazole   40 mg Oral Daily   polyethylene glycol  17 g Oral Daily   senna  2 tablet Oral QHS   sodium chloride  flush  3 mL Intravenous Q12H   ticagrelor   90 mg Oral BID   [START ON 12/24/2023] torsemide   60 mg Oral Daily   Continuous Infusions:  DAPTOmycin  Stopped (12/22/23 1209)     LOS: 9 days   Time spent: 50 minutes  Unk Garb, DO  [facility]  12/23/2023, 1:30 PM

## 2023-12-24 DIAGNOSIS — Z89512 Acquired absence of left leg below knee: Secondary | ICD-10-CM | POA: Diagnosis not present

## 2023-12-24 DIAGNOSIS — T8149XA Infection following a procedure, other surgical site, initial encounter: Secondary | ICD-10-CM | POA: Diagnosis not present

## 2023-12-24 DIAGNOSIS — I5042 Chronic combined systolic (congestive) and diastolic (congestive) heart failure: Secondary | ICD-10-CM | POA: Diagnosis not present

## 2023-12-24 DIAGNOSIS — R7881 Bacteremia: Secondary | ICD-10-CM | POA: Diagnosis not present

## 2023-12-24 LAB — GLUCOSE, CAPILLARY
Glucose-Capillary: 216 mg/dL — ABNORMAL HIGH (ref 70–99)
Glucose-Capillary: 181 mg/dL — ABNORMAL HIGH (ref 70–99)
Glucose-Capillary: 164 mg/dL — ABNORMAL HIGH (ref 70–99)
Glucose-Capillary: 139 mg/dL — ABNORMAL HIGH (ref 70–99)

## 2023-12-24 MED ORDER — INSULIN GLARGINE-YFGN 100 UNIT/ML ~~LOC~~ SOLN
5.0000 [IU] | Freq: Every day | SUBCUTANEOUS | Status: DC
  Administered 2023-12-24 – 2023-12-27 (×4): 5 [IU] via SUBCUTANEOUS
  Filled 2023-12-24 (×4): qty 0.05

## 2023-12-24 MED ORDER — INSULIN ASPART 100 UNIT/ML IJ SOLN
3.0000 [IU] | Freq: Three times a day (TID) | INTRAMUSCULAR | Status: DC
  Administered 2023-12-24 – 2023-12-27 (×10): 3 [IU] via SUBCUTANEOUS

## 2023-12-24 NOTE — TOC Progression Note (Signed)
 Transition of Care [facility]) - Progression Note    Patient Details  Name: Dana [name] MRN: [id number] Date of Birth: 12-09-1960  Transition of Care [facility]) CM/SW Contact  Graves-Bigelow, Jari Merles, RN Phone Number: 12/24/2023, 12:48 PM  Clinical Narrative:  Patient was discussed in progression rounds regarding disposition. Inpatient Rehab Coordinator is awaiting insurance determination from [employer]. Case Manager will continue to follow for additional needs.   Expected Discharge Plan: IP Rehab Facility Barriers to Discharge: English as a second language teacher, Continued Medical Work up    Living arrangements for the past 2 months: Single Family Home  Social Determinants of Health (SDOH) Interventions SDOH Screenings   Food Insecurity: No Food Insecurity (12/14/2023)  Housing: Low Risk  (12/14/2023)  Transportation Needs: No Transportation Needs (12/14/2023)  Utilities: Not At Risk (12/14/2023)  Alcohol Screen: Low Risk  (10/24/2023)  Depression (PHQ2-9): High Risk (10/24/2023)  Financial Resource Strain: Low Risk  (10/24/2023)  Physical Activity: Insufficiently Active (10/24/2023)  Social Connections: Moderately Integrated (12/14/2023)  Stress: Stress Concern Present (10/24/2023)  Tobacco Use: Medium Risk (12/18/2023)  Health Literacy: Adequate Health Literacy (10/24/2023)   Readmission Risk Interventions    07/19/2023    3:45 PM 12/16/2022   11:54 AM 05/19/2021    1:11 PM  Readmission Risk Prevention Plan  Transportation Screening Complete Complete Complete  Medication Review (RN Care Manager) Referral to Pharmacy Referral to Pharmacy Complete  PCP or Specialist appointment within 3-5 days of discharge  Complete Complete  HRI or Home Care Consult Complete Complete Complete  SW Recovery Care/Counseling Consult Complete Complete Complete  Palliative Care Screening Not Applicable Not Applicable Not Applicable  Skilled Nursing Facility Not Applicable Not Applicable Not Applicable

## 2023-12-24 NOTE — Progress Notes (Signed)
 Placed patient on home CPAP machine with oxygen  set at 2lpm

## 2023-12-24 NOTE — Inpatient Diabetes Management (Signed)
 Inpatient Diabetes Program Recommendations  AACE/ADA: New Consensus Statement on Inpatient Glycemic Control (2015)  Target Ranges:  Prepandial:   less than 140 mg/dL      Peak postprandial:   less than 180 mg/dL (1-2 hours)      Critically ill patients:  140 - 180 mg/dL   Lab Results  Component Value Date   GLUCAP 216 (H) 12/24/2023   HGBA1C 10.2 (H) 11/12/2023    Review of Glycemic Control  Latest Reference Range & Units 12/23/23 10:55 12/23/23 16:19 12/23/23 21:01 12/24/23 08:36  Glucose-Capillary 70 - 99 mg/dL 161 (H) 096 (H) 045 (H) 216 (H)  (H): Data is abnormally high Diabetes history: DM2 Outpatient Diabetes medications: Lantus  30 every day,  Novolog  2-10 units TID and FSL2  Current orders for Inpatient glycemic control: Novolog  0-6 units TID & HS   Inpatient Diabetes Program Recommendations:    Consider adding Novolog  3 units TID (assuming patient is consuming >50% of meals).  Thanks, Marjo Sievert, MSN, RNC-OB Diabetes Coordinator [phone] (8a-5p)

## 2023-12-24 NOTE — Progress Notes (Signed)
 Occupational Therapy Treatment Patient Details Name: Dana [name] MRN: [id number] DOB: 11-25-1960 Today's Date: 12/24/2023   History of present illness 63 y.o. female admitted  5/31 with Severe sepsis, present on admission  MRSA bacteremia,  Left ankle post op infection,  Bilateral pyelonephritis. 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 PMH insulin -dependent diabetes mellitus, CKD stage IV, CAD, chronic hypoxic respiratory failure, OSA on CPAP, chronic HFmrEF, depression, anxiety, chronic pain, and left trimalleolar ankle fracture/dislocation status postoperative repair on 11/13/2023   OT comments  Patient received in supine and agreeable to OT treatment. Patient required max assist to donn LLE limb protector and min assist to get to EOB. Patient able to donn RLE shoe and gown seated on EOB. Patient was provided setup for squat pivot transfer and performed lateral scooting to decrease distance and was able to perform transfer with mod assist. Patient was provided green therapy band and instructed in BUE HEP to increase UE functional strength to assist with transfers. Printed HEP provided. Patient will benefit from intensive inpatient follow-up therapy, >3 hours/day. Acute OT to continue to follow to address established goals to facilitate DC to next venue of care.        If plan is discharge home, recommend the following:  A lot of help with walking and/or transfers;Two people to help with walking and/or transfers;A lot of help with bathing/dressing/bathroom;Two people to help with bathing/dressing/bathroom   Equipment Recommendations  Wheelchair (measurements OT);Wheelchair cushion (measurements OT);Other (comment) (drop arm BSC)    Recommendations for Other Services      Precautions / Restrictions Precautions Precautions: Fall Recall of Precautions/Restrictions: Intact Precaution/Restrictions Comments: LLE would vac, L limb protector Restrictions Weight Bearing Restrictions Per Provider Order: Yes LLE Weight  Bearing Per Provider Order: Non weight bearing       Mobility Bed Mobility Overal bed mobility: Needs Assistance Bed Mobility: Supine to Sit     Supine to sit: Used rails, Min assist     General bed mobility comments: min assist with LLE, patient able to scoot on EOB    Transfers Overall transfer level: Needs assistance Equipment used: Rolling walker (2 wheels), None Transfers: Bed to chair/wheelchair/BSC     Squat pivot transfers: Mod assist      Lateral/Scoot Transfers: Min assist General transfer comment: Patient lateral scooting towards drop arm recliner with min assist before performing squat pivot transfer with mod assist towards right     Balance Overall balance assessment: Needs assistance Sitting-balance support: No upper extremity supported, Feet supported Sitting balance-Leahy Scale: Good                                     ADL either performed or assessed with clinical judgement   ADL Overall ADL's : Needs assistance/impaired     Grooming: Set up;Sitting           Upper Body Dressing : Set up;Sitting Upper Body Dressing Details (indicate cue type and reason): gown to cover back Lower Body Dressing: Maximal assistance;+2 for physical assistance;+2 for safety/equipment;Sit to/from stand Lower Body Dressing Details (indicate cue type and reason): able to donn right shoe sitting on EOB Toilet Transfer: Moderate assistance;Squat-pivot Toilet Transfer Details (indicate cue type and reason): simulated to recliner                Extremity/Trunk Assessment              Vision  Perception     Praxis     Communication Communication Communication: No apparent difficulties   Cognition Arousal: Alert Behavior During Therapy: WFL for tasks assessed/performed Cognition: No apparent impairments                               Following commands: Intact        Cueing   Cueing Techniques: Verbal cues,  Tactile cues  Exercises Exercises: General Upper Extremity General Exercises - Upper Extremity Shoulder Flexion: Strengthening, Both, 15 reps, Seated, Theraband Theraband Level (Shoulder Flexion): Level 3 (Green) Shoulder Horizontal ABduction: Strengthening, Both, 15 reps, Seated, Theraband Theraband Level (Shoulder Horizontal Abduction): Level 3 (Green) Elbow Flexion: Strengthening, Both, 15 reps, Seated, Theraband Theraband Level (Elbow Flexion): Level 3 (Green) Elbow Extension: Strengthening, Both, 15 reps, Seated, Theraband    Shoulder Instructions       General Comments VSS on RA    Pertinent Vitals/ Pain       Pain Assessment Pain Assessment: Faces Faces Pain Scale: Hurts a little bit Pain Location: LLE with bed mobility Pain Descriptors / Indicators: Grimacing Pain Intervention(s): Monitored during session, Repositioned  Home Living                                          Prior Functioning/Environment              Frequency  Min 2X/week        Progress Toward Goals  OT Goals(current goals can now be found in the care plan section)  Progress towards OT goals: Progressing toward goals  Acute Rehab OT Goals Patient Stated Goal: to go to rehab OT Goal Formulation: With patient Time For Goal Achievement: 01/02/24 Potential to Achieve Goals: Good ADL Goals Pt Will Perform Lower Body Bathing: sitting/lateral leans;with mod assist;sit to/from stand Pt Will Perform Lower Body Dressing: with mod assist;sitting/lateral leans;sit to/from stand Pt Will Transfer to Toilet: with min assist;with transfer board;anterior/posterior transfer;bedside commode Pt/caregiver will Perform Home Exercise Program: Increased strength;Both right and left upper extremity;With theraband;Independently;With written HEP provided  Plan      Co-evaluation                 AM-PAC OT "6 Clicks" Daily Activity     Outcome Measure   Help from another person  eating meals?: None Help from another person taking care of personal grooming?: A Little Help from another person toileting, which includes using toliet, bedpan, or urinal?: A Lot Help from another person bathing (including washing, rinsing, drying)?: A Lot Help from another person to put on and taking off regular upper body clothing?: A Little Help from another person to put on and taking off regular lower body clothing?: A Lot 6 Click Score: 16    End of Session Equipment Utilized During Treatment: Gait belt  OT Visit Diagnosis: Unsteadiness on feet (R26.81);Other abnormalities of gait and mobility (R26.89);Muscle weakness (generalized) (M62.81)   Activity Tolerance Patient tolerated treatment well   Patient Left in chair;with call bell/phone within reach   Nurse Communication Mobility status        Time: 1610-9604 OT Time Calculation (min): 36 min  Charges: OT General Charges $OT Visit: 1 Visit OT Treatments $Self Care/Home Management : 8-22 mins $Therapeutic Exercise: 8-22 mins  Anitra Barn, OTA Acute Rehabilitation Services  Office [phone]  Kisa Fujii Bennetta Braun 12/24/2023, 10:18 AM

## 2023-12-24 NOTE — Progress Notes (Signed)
 Physical Therapy Treatment Patient Details Name: Dana [name] MRN: [id number] DOB: 1961-03-07 Today's Date: 12/24/2023   History of Present Illness 63 y.o. female admitted  5/31 with Severe sepsis, present on admission  MRSA bacteremia,  Left ankle post op infection,  Bilateral pyelonephritis. 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 PMH insulin -dependent diabetes mellitus, CKD stage IV, CAD, chronic hypoxic respiratory failure, OSA on CPAP, chronic HFmrEF, depression, anxiety, chronic pain, and left trimalleolar ankle fracture/dislocation status postoperative repair on 11/13/2023    PT Comments  Pt is progressing well toward goals.  Today, emphasis on squat pivot transfers x3, chair to bed to chair to bed all to the right with assist to come forward and boost from lower surfaces.     If plan is discharge home, recommend the following: A little help with walking and/or transfers;A little help with bathing/dressing/bathroom;Assistance with cooking/housework;Assist for transportation;Help with stairs or ramp for entrance   Can travel by private vehicle        Equipment Recommendations  Wheelchair (measurements PT);Wheelchair cushion (measurements PT)    Recommendations for Other Services Rehab consult     Precautions / Restrictions Precautions Precautions: Fall Recall of Precautions/Restrictions: Intact Precaution/Restrictions Comments: LLE would vac, L limb protector Restrictions LLE Weight Bearing Per Provider Order: Non weight bearing     Mobility  Bed Mobility Overal bed mobility: Needs Assistance Bed Mobility: Sit to Supine     Supine to sit: Supervision, Min assist          Transfers Overall transfer level: Needs assistance   Transfers: Bed to chair/wheelchair/BSC       Squat pivot transfers: Mod assist     General transfer comment: Emphasis today on squat pivot transfers x3 with education on prep and mod assist to assist with coming forward and boost.    Ambulation/Gait                    Stairs             Wheelchair Mobility     Tilt Bed    Modified Rankin (Stroke Patients Only)       Balance     Sitting balance-Leahy Scale: Good       Standing balance-Leahy Scale: Poor Standing balance comment: reliant on the RW                            Communication Communication Communication: No apparent difficulties  Cognition Arousal: Alert Behavior During Therapy: WFL for tasks assessed/performed   PT - Cognitive impairments: No apparent impairments                         Following commands: Intact      Cueing Cueing Techniques: Verbal cues, Tactile cues  Exercises      General Comments General comments (skin integrity, edema, etc.): vss,  Addressed bloody drainage and leaky seal on the VAC dressing prior to leaving.      Pertinent Vitals/Pain Pain Assessment Faces Pain Scale: Hurts little more Pain Location: LLE with bed mobility Pain Descriptors / Indicators: Grimacing Pain Intervention(s): Monitored during session    Home Living                          Prior Function            PT Goals (current goals can now be found in the care plan section)  Acute Rehab PT Goals PT Goal Formulation: With patient Time For Goal Achievement: 01/02/24 Potential to Achieve Goals: Good Progress towards PT goals: Progressing toward goals    Frequency    Min 2X/week      PT Plan      Co-evaluation              AM-PAC PT "6 Clicks" Mobility   Outcome Measure  Help needed turning from your back to your side while in a flat bed without using bedrails?: A Little Help needed moving from lying on your back to sitting on the side of a flat bed without using bedrails?: A Little Help needed moving to and from a bed to a chair (including a wheelchair)?: A Lot Help needed standing up from a chair using your arms (e.g., wheelchair or bedside chair)?: A Lot Help needed to walk in hospital  room?: Total   6 Click Score: 11    End of Session Equipment Utilized During Treatment: Gait belt Activity Tolerance: Patient tolerated treatment well Patient left: in bed;with call bell/phone within reach;with nursing/sitter in room Nurse Communication: Mobility status PT Visit Diagnosis: Other abnormalities of gait and mobility (R26.89);Muscle weakness (generalized) (M62.81);Pain Pain - Right/Left: Left Pain - part of body: Leg     Time: 4540-9811 PT Time Calculation (min) (ACUTE ONLY): 24 min  Charges:    $Therapeutic Activity: 23-37 mins PT General Charges $$ ACUTE PT VISIT: 1 Visit                     12/24/2023  [name], PT Acute Rehabilitation Services [phone]  (office)   Durell Gilding Elliotte Marsalis 12/24/2023, 6:05 PM

## 2023-12-24 NOTE — Plan of Care (Signed)
  Problem: Education: Goal: Knowledge of General Education information will improve Description: Including pain rating scale, medication(s)/side effects and non-pharmacologic comfort measures Outcome: Progressing   Problem: Health Behavior/Discharge Planning: Goal: Ability to manage health-related needs will improve Outcome: Progressing   Problem: Clinical Measurements: Goal: Ability to maintain clinical measurements within normal limits will improve Outcome: Progressing Goal: Will remain free from infection Outcome: Progressing Goal: Diagnostic test results will improve Outcome: Progressing   Problem: Activity: Goal: Risk for activity intolerance will decrease Outcome: Progressing   Problem: Nutrition: Goal: Adequate nutrition will be maintained Outcome: Progressing   Problem: Coping: Goal: Level of anxiety will decrease Outcome: Progressing   Problem: Elimination: Goal: Will not experience complications related to bowel motility Outcome: Progressing Goal: Will not experience complications related to urinary retention Outcome: Progressing   Problem: Pain Managment: Goal: General experience of comfort will improve and/or be controlled Outcome: Progressing   Problem: Safety: Goal: Ability to remain free from injury will improve Outcome: Progressing   Problem: Skin Integrity: Goal: Risk for impaired skin integrity will decrease Outcome: Progressing   Problem: Education: Goal: Ability to describe self-care measures that may prevent or decrease complications (Diabetes Survival Skills Education) will improve Outcome: Progressing Goal: Individualized Educational Video(s) Outcome: Progressing   Problem: Coping: Goal: Ability to adjust to condition or change in health will improve Outcome: Progressing   Problem: Fluid Volume: Goal: Ability to maintain a balanced intake and output will improve Outcome: Progressing   Problem: Health Behavior/Discharge  Planning: Goal: Ability to identify and utilize available resources and services will improve Outcome: Progressing Goal: Ability to manage health-related needs will improve Outcome: Progressing   Problem: Metabolic: Goal: Ability to maintain appropriate glucose levels will improve Outcome: Progressing   Problem: Nutritional: Goal: Maintenance of adequate nutrition will improve Outcome: Progressing Goal: Progress toward achieving an optimal weight will improve Outcome: Progressing   Problem: Skin Integrity: Goal: Risk for impaired skin integrity will decrease Outcome: Progressing   Problem: Tissue Perfusion: Goal: Adequacy of tissue perfusion will improve Outcome: Progressing   Problem: Fluid Volume: Goal: Hemodynamic stability will improve Outcome: Progressing   Problem: Clinical Measurements: Goal: Diagnostic test results will improve Outcome: Progressing Goal: Signs and symptoms of infection will decrease Outcome: Progressing   Problem: Respiratory: Goal: Ability to maintain adequate ventilation will improve Outcome: Progressing

## 2023-12-24 NOTE — Progress Notes (Addendum)
 PROGRESS NOTE    QUENTINA FRONEK  ZOX:[id number] DOB: [birth year] DOA: 12/14/2023 PCP: Mercy Stall, MD  Subjective: Pt seen and examined.  Awaiting CIR   Hospital Course: HPI: ANICKA STUCKERT is a pleasant 63 y.o. female with medical history significant for insulin -dependent diabetes mellitus, CKD stage IV, CAD, chronic hypoxic respiratory failure, OSA on CPAP, chronic HFmrEF, depression, anxiety, chronic pain, and left trimalleolar ankle fracture/dislocation status postoperative repair on 11/13/2023 who presented to the [facility] ED the night of 12/12/2023 with fevers, dysuria, and flank pain.   [facility] ED and Hospital Course: Upon arrival to the ED, patient is found to be febrile.  She had a leukocytosis 24,700.  CT of the abdomen and pelvis demonstrates bladder wall thickening with surrounding fat stranding.  Blood and urine cultures were collected, she was given IV fluids and broad-spectrum antibiotics, and was admitted to the hospitalist service.   Shortly after admission, it was noted that she had purulent drainage from left heel wound as well as open wounds at the medial aspect of the left ankle.  Orthopedic surgery at [facility] [place], [place]) was consulted and recommended medical admission to [facility] where they will consult.   Blood cultures at [facility] are growing MRSA.  Significant Events: Admitted 12/14/2023 for surgical site infection left ankle with MRSA 12-18-2023 left BKA 12-23-2023 TEE negative for endocarditis  Admission Labs: Na 134, K 4.3, CO2 of 22, BUN 50, Scr 2.25, glu 137 T. Prot 6.5, alb 2.1, AST 11, ALT 11, alk phos 144  Admission Imaging Studies: CXR Prominent cardiac silhouette and vascular congestion  XR left ankle Postop changes. Disrupted ankle mortise. Displaced fractures. Soft tissue swelling CT renal stone No hydronephrosis. 2. Mild nonspecific bilateral perinephric fat stranding, mildly increased. Correlate clinically for  infection. 3. Enlarged left ovary with surrounding inflammatory stranding and fluid, a new finding. Findings may be related to ovarian torsion or infection. Recommend further evaluation with pelvic ultrasound. 4. Increasing body wall edema and presacral edema. 5. Trace bilateral pleural effusions. 6. Cardiomegaly. 7. Mild splenomegaly. 8. Aortic atherosclerosis Pelvic U/S Status post hysterectomy. 2. Nonvisualization of the ovaries bilaterally. If indicated, pelvic MRI examination with contrast may be helpful for further evaluation. 3. Trace simple appearing free fluid within the right adnexa, nonspecific  Significant Labs:   Significant Imaging Studies: 12-15-2023 LE U/S negative for DVT 12-15-2023 echo LVEF 40%, hx of mitral clip 12-16-2023 V/Q scan No evidence of acute pulmonary embolism on perfusion scintigraphy by PISAPED criteria. Mildly heterogeneous left perihilar perfusion, similar to previous examination  Antibiotic Therapy: Anti-infectives (From admission, onward)    Start     Dose/Rate Route Frequency Ordered Stop   12/20/23 1000  DAPTOmycin  (CUBICIN ) IVPB 700 mg/100mL premix  Status:  Discontinued        700 mg 200 mL/hr over 30 Minutes Intravenous Daily 12/19/23 1009 12/20/23 0821   12/20/23 1000  DAPTOmycin  (CUBICIN ) IVPB 700 mg/116mL premix        700 mg 200 mL/hr over 30 Minutes Intravenous Every 48 hours 12/20/23 0821     12/18/23 0600  vancomycin  (VANCOREADY) IVPB 1250 mg/250 mL  Status:  Discontinued        1,250 mg 166.7 mL/hr over 90 Minutes Intravenous Every 48 hours 12/17/23 1452 12/19/23 1009   12/18/23 0600  ceFAZolin  (ANCEF ) IVPB 2g/100 mL premix  Status:  Discontinued        2 g 200 mL/hr over 30 Minutes Intravenous On call to O.R. 12/17/23 2121  12/18/23 1254   12/16/23 1400  ceFAZolin  (ANCEF ) IVPB 1 g/50 mL premix        1 g 100 mL/hr over 30 Minutes Intravenous Every 8 hours 12/16/23 0937 12/20/23 0015   12/16/23 0600  Vancomycin  (VANCOCIN ) 1,250 mg in  sodium chloride  0.9 % 250 mL IVPB  Status:  Discontinued        1,250 mg 166.7 mL/hr over 90 Minutes Intravenous Every 48 hours 12/14/23 1009 12/17/23 1452   12/14/23 1800  piperacillin -tazobactam (ZOSYN ) IVPB 3.375 g  Status:  Discontinued        3.375 g 12.5 mL/hr over 240 Minutes Intravenous Every 8 hours 12/14/23 1710 12/16/23 0940   12/14/23 0600  vancomycin  (VANCOCIN ) IVPB 1000 mg/200 mL premix  Status:  Discontinued        1,000 mg 200 mL/hr over 60 Minutes Intravenous Every 24 hours 12/14/23 0210 12/14/23 1009   12/14/23 0215  vancomycin  (VANCOCIN ) IVPB 1000 mg/200 mL premix  Status:  Discontinued        1,000 mg 200 mL/hr over 60 Minutes Intravenous  Once 12/14/23 0122 12/14/23 0130   12/14/23 0215  cefTRIAXone  (ROCEPHIN ) 2 g in sodium chloride  0.9 % 100 mL IVPB  Status:  Discontinued        2 g 200 mL/hr over 30 Minutes Intravenous Daily at bedtime 12/14/23 0155 12/14/23 1703       Procedures: 12-18-2023 left BKA 12-23-2023 TEE negative for endocarditis  Consultants: Infectious Disease Orthopedics Psych Cardiology    Assessment and Plan: * Surgical site infection 12-20-2023 remains on IV Daptomycin  for her MRSA bacteremia. Needs TEE on Monday. Then hopefully ins authorization for CIR.  12-21-2023 remains on IV Dapto. After her TEE on Monday, ID service to decide remaining duration of her abx therapy.  12-22-2023 NPO after MN. For TEE tomorrow.  12-23-2023 Pt had TEE today. Negative for endocarditis. ID says that she will only need a few more days of IV dapto.  12-24-2023 Currently receiving daptomycin  700mg  Q 48hr, next doses would be 6/10, 6/12 and 6/14. Then stop.  S/P BKA (below knee amputation), left (HCC) 12-20-2023 continue with IV dilaudid  prn. POD #2  12-21-2023 still using prn IV dilaudid . Continue  IV dilaudid  for now. POD #3  12-22-2023 still using prn IV dilaudid . POD #4. Continue IV dilaudid  for 1-2 more days.  12-23-2023 pt states she has  stopped using IV dilaudid . Will DC it off her MAR. Continue with po oxycodone . POD #5  12-24-2023 POD #6. Pt states pt controlled with oxycodone . Awaiting CIR admission.  MRSA bacteremia - end of therapy with Daptomycin  12/28/2023. 12-20-2023 remains on IV Daptomycin  for her MRSA bacteremia. Needs TEE on Monday. Then hopefully ins authorization for CIR.  12-21-2023 remains on IV Dapto. After her TEE on Monday, ID service to decide remaining duration of her abx therapy.   12-22-2023 NPO after MN. For TEE tomorrow.   12-23-2023 Pt had TEE today. Negative for endocarditis. ID says that she will only need a few more days of IV dapto.  12-24-2023 Currently receiving daptomycin  700mg  Q 48hr, next doses would be 6/10, 6/12 and 6/14. Then stop.   Depression with anxiety 12-20-2023 psych has signed off.  12-21-2023 stable.  12-22-2023 stable.  12-23-2023 stable  12-24-2023 stable.  Coronary artery disease involving native coronary artery of native heart with angina pectoris (HCC) 12-20-2023 stable.  12-21-2023 stable.  12-22-2023 stable.  12-23-2023 stable  12-24-2023 stable.  CKD stage 4 due to type 1 diabetes mellitus (  HCC) - baseline 2.2 - 2.4 12-20-2023 stable Scr. 2.44  12-21-2023 stable. BMP in AM.  12-22-2023 stable. Scr 2.22 today.  12-23-2023 stable  12-24-2023 stable. Check BMP/Mg in AM now that she is on demadex  60 mg daily.  Generalized anxiety disorder 12-20-2023 psych has signed off.  12-21-2023 stable.  12-22-2023 stable.  12-23-2023 stable.  12-24-2023 stable.  Uncontrolled type 1 diabetes mellitus with hyperglycemia, with long-term current use of insulin  (HCC) 12-20-2023 had hypoglycemia last night. Long acting insulin  stopped. Changed to lowest dose SSI.  Last A1c of 10.2% in April, 2025. Monitor for DKA.  HgBA1c and mean plasma glucose: Lab Results  Component Value Date/Time   HGBA1C 10.2 (H) 11/12/2023 06:19 AM   MPG 246.04 11/12/2023 06:19  AM   12-21-2023 CBG stable. Monitor for DKA. Last dose long acting insulin (lantus ) at 2 pm on 12-19-2023.  Monitor to make sure she is not slipping into DKA.  12-22-2023 stable.  12-23-2023 CBG stable off long acting insulin . No evidence of DKA.  12-24-2023 now that she no longer has to be NPO and it tolerating solid food, will add small dose of lantus  5 units daily and 3 units novolog  with each meal. Already on sensitive SSI.  OSA on CPAP 12-20-2023 stable.  12-21-2023 stable.  12-22-2023 stable.  12-23-2023 stable.  12-24-2023 stable.  Chronic respiratory failure with hypoxia (HCC) 12-20-2023 stable.  12-21-2023 stable.  12-22-2023 stable. On RA.  12-23-2023 on RA.  12-24-2023 on RA.  Chronic combined systolic (congestive) and diastolic (congestive) heart failure (HCC) 12-20-2023 cardiology has decreased IV lasix  to 60 mg daily.  12-21-2023 stable. Remains on 60 mg IV lasix  per day.  12-22-2023 stable. Cards has kept her on 60 mg IV lasix  per day.  12-23-2023 cards has changed to po demadex  60 mg daily. Remains on coreg  9.375 mg bid  12-24-2023 stable on demadex  60 mg daily, coreg  9.375 mgm bid. Check BMP/Mg in AM. Pt able to lie flat.  UTI (urinary tract infection)-resolved as of 12/20/2023 Pt completed 5 days of IV ancef  for her UTI.   DVT prophylaxis: heparin  injection 5,000 Units Start: 12/14/23 1400 SCDs Start: 12/14/23 0119     Code Status: Full Code Family Communication: pt is decisional. No family at bedside Disposition Plan: CIR Reason for continuing need for hospitalization: medically stable for transfer to CIR.  Objective: Vitals:   12/24/23 0400 12/24/23 0808 12/24/23 1135 12/24/23 1612  BP: (!) 135/59 (!) 131/49 104/81 (!) 124/50  Pulse: 64 66 (!) 19 62  Resp: 17 17    Temp: 98 F (36.7 C) 98 F (36.7 C) 98.1 F (36.7 C) 97.7 F (36.5 C)  TempSrc: Oral Oral Oral Oral  SpO2: 100% 98%    Weight:      Height:        Intake/Output  Summary (Last 24 hours) at 12/24/2023 1644 Last data filed at 12/24/2023 1433 Gross per 24 hour  Intake 608 ml  Output 850 ml  Net -242 ml   Filed Weights   12/14/23 0100 12/14/23 0200  Weight: 103.3 kg 98 kg    Examination:  Physical Exam Vitals and nursing note reviewed.  Constitutional:      Appearance: She is obese.  HENT:     Head: Normocephalic and atraumatic.     Nose: Nose normal.  Cardiovascular:     Rate and Rhythm: Normal rate and regular rhythm.  Pulmonary:     Effort: Pulmonary effort is normal.     Breath sounds:  Normal breath sounds.  Abdominal:     General: Bowel sounds are normal.     Palpations: Abdomen is soft.  Musculoskeletal:     Comments: Pt has compression stocking on left BKA. Stump protector has been removed. Still with wound vac.  Skin:    General: Skin is warm and dry.     Capillary Refill: Capillary refill takes less than 2 seconds.  Neurological:     General: No focal deficit present.     Mental Status: She is alert and oriented to person, place, and time.    Data Reviewed: I have personally reviewed following labs and imaging studies  CBC: Recent Labs  Lab 12/18/23 0351 12/19/23 0433 12/20/23 0509 12/23/23 0500  WBC 13.4* 17.3* 14.5* 12.6*  HGB 9.5* 9.3* 8.4* 8.8*  HCT 30.4* 30.7* 27.3* 28.2*  MCV 87.6 87.5 87.8 86.2  PLT 280 316 297 327   Basic Metabolic Panel: Recent Labs  Lab 12/18/23 0351 12/19/23 0433 12/20/23 0509 12/21/23 0834 12/22/23 0404  NA 135 133* 132* 134* 135  K 4.5 4.6 4.5 4.6 4.6  CL 99 97* 96* 101 101  CO2 25 25 27 24 24   GLUCOSE 80 238* 128* 138* 150*  BUN 57* 59* 60* 63* 61*  CREATININE 2.05* 2.25* 2.44* 2.32* 2.22*  CALCIUM  8.8* 8.5* 8.2* 8.2* 8.6*   GFR: Estimated Creatinine Clearance: 31.2 mL/min (A) (by C-G formula based on SCr of 2.22 mg/dL (H)). Cardiac Enzymes: Recent Labs  Lab 12/20/23 0509  CKTOTAL 8*   BNP (last 3 results) Recent Labs    07/13/23 1325 08/26/23 2233  BNP  212.4* 511.3*   CBG: Recent Labs  Lab 12/23/23 1055 12/23/23 1619 12/23/23 2101 12/24/23 0836 12/24/23 1142  GLUCAP 127* 189* 265* 216* 181*   Recent Results (from the past 240 hours)  Surgical pcr screen     Status: None   Collection Time: 12/18/23  9:15 AM   Specimen: Nasal Mucosa; Nasal Swab  Result Value Ref Range Status   MRSA, PCR NEGATIVE NEGATIVE Final   Staphylococcus aureus NEGATIVE NEGATIVE Final    Comment: (NOTE) The Xpert SA Assay (FDA approved for NASAL specimens in patients 43 years of age and older), is one component of a comprehensive surveillance program. It is not intended to diagnose infection nor to guide or monitor treatment. Performed at [facility], [address], [place], [place] [address]      Radiology Studies: ECHO TEE Result Date: 12/23/2023    TRANSESOPHOGEAL ECHO REPORT   Patient Name:   LAURANN MCMORRIS Date of Exam: 12/23/2023 Medical Rec #:  [id number]      Height:       65.0 in Accession #:    [id number]     Weight:       216.0 lb Date of Birth:  Apr 18, 1961      BSA:          2.044 m Patient Age:    62 years       BP:           144/62 mmHg Patient Gender: F              HR:           79 bpm. Exam Location:  Inpatient Procedure: Transesophageal Echo, 3D Echo, Cardiac Doppler and Color Doppler            (Both Spectral and Color Flow Doppler were utilized during  procedure). Indications:     Endocarditis  History:         Patient has prior history of Echocardiogram examinations, most                  recent 12/15/2023. CHF, CAD, Mitral Valve Disease,                  Arrythmias:Atrial Fibrillation; Risk Factors:Hypertension,                  Diabetes and Sleep Apnea.                   Mitral Valve: Mitra-Clip valve is present in the mitral                  position.  Sonographer:     Astrid Blamer Referring Phys:  [id number] BRIDGETTE CHRISTOPHER Diagnosing Phys: Carson Clara MD PROCEDURE: After discussion of the risks and benefits of a  TEE, an informed consent was obtained from the patient. The transesophogeal probe was passed without difficulty through the esophogus of the patient. Sedation performed by different physician. The patient was monitored while under deep sedation. Anesthestetic sedation was provided intravenously by Anesthesiology: 305mg  of Propofol , 60mg  of Lidocaine . The patient developed no complications during the procedure.  IMPRESSIONS  1. Left ventricular ejection fraction, by estimation, is 50 to 55%. The left ventricle has low normal function.  2. Right ventricular systolic function is normal. The right ventricular size is mildly enlarged.  3. Left atrial size was mildly dilated. No left atrial/left atrial appendage thrombus was detected.  4. The aortic valve is tricuspid. Aortic valve regurgitation is not visualized. No aortic stenosis is present.  5. 3D performed of the mitral valve, 3D performed of the aortic valve and 3D performed of the atrial septum  6. Evidence of atrial level shunting detected by color flow Doppler. Known iatrogenic ASD after mitraclip with left to right flow.  7. There is a Mitra-Clip present in the mitral position.     Severe mitral valve regurgitation. No evidence of mitral stenosis. The mean mitral valve gradient is 4.0 mmHg with average heart rate of 65 bpm. Blunting but no clear reversal of pulmonary vein systolic flow. Thre are 2 MR jets with 3D VCA 0.35cm^2 for larger jet and 0.11cm^2 for smaller jet, with total 3D VCA 0.46cm^2 suggesting severe MR. Conclusion(s)/Recommendation(s): No evidence of vegetation/infective endocarditis on this transesophageael echocardiogram. FINDINGS  Left Ventricle: Left ventricular ejection fraction, by estimation, is 50 to 55%. The left ventricle has low normal function. The left ventricular internal cavity size was normal in size. Right Ventricle: The right ventricular size is mildly enlarged. No increase in right ventricular wall thickness. Right ventricular  systolic function is normal. Left Atrium: Left atrial size was mildly dilated. No left atrial/left atrial appendage thrombus was detected. Right Atrium: Right atrial size was normal in size. Pericardium: There is no evidence of pericardial effusion. Mitral Valve: The mitral valve has been repaired/replaced. Severe mitral valve regurgitation. There is a Mitra-Clip present in the mitral position. No evidence of mitral valve stenosis. MV peak gradient, 14.0 mmHg. The mean mitral valve gradient is 4.0 mmHg with average heart rate of 65 bpm. Tricuspid Valve: The tricuspid valve is normal in structure. Tricuspid valve regurgitation is trivial. Aortic Valve: The aortic valve is tricuspid. Aortic valve regurgitation is not visualized. No aortic stenosis is present. Pulmonic Valve: The pulmonic valve was not well visualized. Pulmonic valve regurgitation is trivial. Aorta:  The aortic root and ascending aorta are structurally normal, with no evidence of dilitation. IAS/Shunts: Evidence of atrial level shunting detected by color flow Doppler. Additional Comments: 3D was performed not requiring image post processing on an independent workstation and was abnormal. MITRAL VALVE MV Area (PHT): 2.56 cm MV Peak grad:  14.0 mmHg MV Mean grad:  4.0 mmHg MV Vmax:       1.87 m/s MV Vmean:      90.5 cm/s MV Decel Time: 296 msec MV E velocity: 178.00 cm/s Carson Clara MD Electronically signed by Carson Clara MD Signature Date/Time: 12/23/2023/12:30:49 PM    Final    EP STUDY Result Date: 12/23/2023 See surgical note for result.   Scheduled Meds:  allopurinol   50 mg Oral Daily   aspirin  EC  81 mg Oral Daily   carvedilol   9.375 mg Oral BID WC   Chlorhexidine  Gluconate Cloth  6 each Topical Daily   escitalopram   10 mg Oral Daily   ezetimibe   10 mg Oral Daily   gabapentin   300 mg Oral TID   heparin  injection (subcutaneous)  5,000 Units Subcutaneous Q8H   insulin  aspart  0-6 Units Subcutaneous TID AC & HS   insulin   aspart  3 Units Subcutaneous TID WC   insulin  glargine-yfgn  5 Units Subcutaneous Daily   latanoprost   1 drop Both Eyes QHS   pantoprazole   40 mg Oral Daily   polyethylene glycol  17 g Oral Daily   senna  2 tablet Oral QHS   sodium chloride  flush  3 mL Intravenous Q12H   ticagrelor   90 mg Oral BID   torsemide   60 mg Oral Daily   Continuous Infusions:  DAPTOmycin  700 mg (12/24/23 0902)     LOS: 10 days   Time spent: 55 minutes  Unk Garb, DO  [facility]  12/24/2023, 4:44 PM

## 2023-12-24 NOTE — Progress Notes (Addendum)
 Inpatient Rehab Admissions Coordinator:   Awaiting determination from HTA.  Sent message to OT to see if they are able to see her today.    Loye Rumble, PT, DPT Admissions Coordinator [phone] 12/24/23  9:29 AM

## 2023-12-24 NOTE — Progress Notes (Addendum)
    Message sent to AHF coordinator to arrange follow up appointment on 01/09/24, general cardiology will sign off, please call if any question.

## 2023-12-24 NOTE — Plan of Care (Signed)
  Problem: Education: Goal: Knowledge of General Education information will improve Description: Including pain rating scale, medication(s)/side effects and non-pharmacologic comfort measures Outcome: Progressing   Problem: Clinical Measurements: Goal: Ability to maintain clinical measurements within normal limits will improve Outcome: Progressing Goal: Diagnostic test results will improve Outcome: Progressing   Problem: Activity: Goal: Risk for activity intolerance will decrease Outcome: Progressing   Problem: Nutrition: Goal: Adequate nutrition will be maintained Outcome: Progressing   Problem: Coping: Goal: Level of anxiety will decrease Outcome: Progressing   Problem: Elimination: Goal: Will not experience complications related to bowel motility Outcome: Progressing Goal: Will not experience complications related to urinary retention Outcome: Progressing   Problem: Pain Managment: Goal: General experience of comfort will improve and/or be controlled Outcome: Progressing   Problem: Safety: Goal: Ability to remain free from injury will improve Outcome: Progressing   Problem: Skin Integrity: Goal: Risk for impaired skin integrity will decrease Outcome: Progressing   Problem: Coping: Goal: Ability to adjust to condition or change in health will improve Outcome: Progressing   Problem: Fluid Volume: Goal: Ability to maintain a balanced intake and output will improve Outcome: Progressing   Problem: Metabolic: Goal: Ability to maintain appropriate glucose levels will improve Outcome: Progressing   Problem: Nutritional: Goal: Maintenance of adequate nutrition will improve Outcome: Progressing   Problem: Skin Integrity: Goal: Risk for impaired skin integrity will decrease Outcome: Progressing   Problem: Tissue Perfusion: Goal: Adequacy of tissue perfusion will improve Outcome: Progressing   Problem: Clinical Measurements: Goal: Signs and symptoms of  infection will decrease Outcome: Progressing   Problem: Respiratory: Goal: Ability to maintain adequate ventilation will improve Outcome: Progressing

## 2023-12-25 DIAGNOSIS — T8149XA Infection following a procedure, other surgical site, initial encounter: Secondary | ICD-10-CM | POA: Diagnosis not present

## 2023-12-25 LAB — BASIC METABOLIC PANEL WITH GFR
Anion gap: 11 (ref 5–15)
BUN: 53 mg/dL — ABNORMAL HIGH (ref 6–20)
CO2: 28 mmol/L (ref 22–32)
Calcium: 8.3 mg/dL — ABNORMAL LOW (ref 8.9–10.3)
Chloride: 95 mmol/L — ABNORMAL LOW (ref 98–111)
Creatinine, Ser: 2.31 mg/dL — ABNORMAL HIGH (ref 0.44–1.00)
GFR, Estimated: 24 mL/min — ABNORMAL LOW (ref >60)
Glucose, Bld: 177 mg/dL — ABNORMAL HIGH (ref 70–99)
Potassium: 4.6 mmol/L (ref 3.5–5.1)
Sodium: 134 mmol/L — ABNORMAL LOW (ref 135–145)

## 2023-12-25 LAB — GLUCOSE, CAPILLARY
Glucose-Capillary: 145 mg/dL — ABNORMAL HIGH (ref 70–99)
Glucose-Capillary: 142 mg/dL — ABNORMAL HIGH (ref 70–99)
Glucose-Capillary: 117 mg/dL — ABNORMAL HIGH (ref 70–99)
Glucose-Capillary: 133 mg/dL — ABNORMAL HIGH (ref 70–99)
Glucose-Capillary: 132 mg/dL — ABNORMAL HIGH (ref 70–99)

## 2023-12-25 LAB — MAGNESIUM: Magnesium: 1.8 mg/dL (ref 1.7–2.4)

## 2023-12-25 MED ORDER — BUSPIRONE HCL 5 MG PO TABS
15.0000 mg | ORAL_TABLET | Freq: Three times a day (TID) | ORAL | Status: DC
  Administered 2023-12-25 – 2023-12-27 (×6): 15 mg via ORAL
  Filled 2023-12-25: qty 3
  Filled 2023-12-25 (×5): qty 1

## 2023-12-25 NOTE — Progress Notes (Signed)
  Progress Note   Patient: Dana [name] QIO:[id number] DOB: Jan 11, 1961 DOA: 12/14/2023     11 DOS: the patient was seen and examined on 12/25/2023 at 11:29AM      Brief hospital course: 63 y.o. F with CKD IV baseline 2.2-2.4, IDDM, history left great toe amputation, CAD, HFmrEF and chronic respiratory failure and recent left trimalleolar ankle fracture/dislocation s/p ORIF 4/30 who presented to OSH 5/29 with fever.    After admission, noted to have drainage from recent surgical wound, so transferred to [facility] for orthopedic evaluation.  Found to have MRSA bacteremia, and Orthopedics recommended BKA.     Assessment and Plan: * Surgical site infection S/P BKA (below knee amputation), left (HCC) MRSA bacteremia - end of therapy with Daptomycin  12/28/2023. - Continue daptomycin     Coronary artery disease involving native coronary artery of native heart with angina pectoris (HCC) - Continue aspirin , Coreg , Zetia , Brilinta   CKD stage 4 due to type 1 diabetes mellitus (HCC) - baseline 2.2 - 2.4 Cr stable  Uncontrolled type 1 diabetes mellitus with hyperglycemia, with long-term current use of insulin  (HCC) Glucose controlled - Continue glargine - Continue Ss corrections - Cotninue gabapentin   Depression with anxiety Generalized anxiety disorder - Resume buspar  - Continue lexapro   OSA on CPAP - CPAP at night  Chronic respiratory failure with hypoxia (HCC) Stable  Chronic combined systolic (congestive) and diastolic (congestive) heart failure (HCC) Appears euvolemic - Continue torsemide , Coreg   Gout - Continue allopurinol         Subjective: No new complaints, feeling okay.  Asking about Buspar  today.  No fever     Physical Exam: BP (!) 112/48 (BP Location: Right Wrist)   Pulse 63   Temp 98.1 F (36.7 C) (Oral)   Resp 16   Ht 5' 5 (1.651 m)   Wt 98 kg Comment: at [facility]  SpO2 90%   BMI 35.95 kg/m   Adult female, sitting up in bed, interactive and  appropriate RRR, no murmurs, no peripheral edema Respiratory rate normal, lungs clear without rales or wheezes Abdomen soft no tenderness palpation or guarding The left leg stump has shrinker on, there is some drainage around her wound VAC Attention normal, affect normal, judgment and insight appear normal    Data Reviewed: Cr stable at 2.3 CBC shows Wbc down to 12    Family Communication:     Disposition: Status is: Inpatient         Author: Ephriam Hashimoto, MD 12/25/2023 5:14 PM  For on call review [email or web address].

## 2023-12-25 NOTE — Plan of Care (Signed)
  Problem: Education: Goal: Knowledge of General Education information will improve Description: Including pain rating scale, medication(s)/side effects and non-pharmacologic comfort measures Outcome: Progressing   Problem: Health Behavior/Discharge Planning: Goal: Ability to manage health-related needs will improve Outcome: Progressing   Problem: Clinical Measurements: Goal: Ability to maintain clinical measurements within normal limits will improve Outcome: Progressing Goal: Will remain free from infection Outcome: Progressing Goal: Diagnostic test results will improve Outcome: Progressing   Problem: Activity: Goal: Risk for activity intolerance will decrease Outcome: Progressing   Problem: Nutrition: Goal: Adequate nutrition will be maintained Outcome: Progressing   Problem: Coping: Goal: Level of anxiety will decrease Outcome: Progressing   Problem: Elimination: Goal: Will not experience complications related to bowel motility Outcome: Progressing Goal: Will not experience complications related to urinary retention Outcome: Progressing   Problem: Pain Managment: Goal: General experience of comfort will improve and/or be controlled Outcome: Progressing   Problem: Safety: Goal: Ability to remain free from injury will improve Outcome: Progressing   Problem: Skin Integrity: Goal: Risk for impaired skin integrity will decrease Outcome: Progressing   Problem: Education: Goal: Ability to describe self-care measures that may prevent or decrease complications (Diabetes Survival Skills Education) will improve Outcome: Progressing Goal: Individualized Educational Video(s) Outcome: Progressing   Problem: Coping: Goal: Ability to adjust to condition or change in health will improve Outcome: Progressing   Problem: Fluid Volume: Goal: Ability to maintain a balanced intake and output will improve Outcome: Progressing   Problem: Health Behavior/Discharge  Planning: Goal: Ability to identify and utilize available resources and services will improve Outcome: Progressing Goal: Ability to manage health-related needs will improve Outcome: Progressing   Problem: Metabolic: Goal: Ability to maintain appropriate glucose levels will improve Outcome: Progressing   Problem: Nutritional: Goal: Maintenance of adequate nutrition will improve Outcome: Progressing Goal: Progress toward achieving an optimal weight will improve Outcome: Progressing   Problem: Skin Integrity: Goal: Risk for impaired skin integrity will decrease Outcome: Progressing   Problem: Tissue Perfusion: Goal: Adequacy of tissue perfusion will improve Outcome: Progressing   Problem: Fluid Volume: Goal: Hemodynamic stability will improve Outcome: Progressing   Problem: Clinical Measurements: Goal: Diagnostic test results will improve Outcome: Progressing Goal: Signs and symptoms of infection will decrease Outcome: Progressing   Problem: Respiratory: Goal: Ability to maintain adequate ventilation will improve Outcome: Progressing

## 2023-12-25 NOTE — Progress Notes (Signed)
 Physical Therapy Treatment Patient Details Name: Dana [name] MRN: [id number] DOB: [birth year] Today's Date: 12/25/2023   History of Present Illness 63 y.o. female admitted  5/31 with Severe sepsis, present on admission  MRSA bacteremia,  Left ankle post op infection,  Bilateral pyelonephritis. 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 PMH insulin -dependent diabetes mellitus, CKD stage IV, CAD, chronic hypoxic respiratory failure, OSA on CPAP, chronic HFmrEF, depression, anxiety, chronic pain, and left trimalleolar ankle fracture/dislocation status postoperative repair on 11/13/2023    PT Comments  Pt continues to work hard to regaining her independence. Focus of session on power up to RW and trying to find CoG to be able to balance with transfers from bed to chair. Pt is supervision for bed mobility, and mod Ax2 for sit<>stand and maxAx2 for stand pivot to chair. Pt is making good progress towards her goals. D/c plans remain appropriate. PT will continue to follow acutely.    If plan is discharge home, recommend the following: A little help with walking and/or transfers;A little help with bathing/dressing/bathroom;Assistance with cooking/housework;Assist for transportation;Help with stairs or ramp for entrance   Can travel by private vehicle      Yes  Equipment Recommendations  Wheelchair (measurements PT);Wheelchair cushion (measurements PT)    Recommendations for Other Services Rehab consult     Precautions / Restrictions Precautions Precautions: Fall Recall of Precautions/Restrictions: Intact Precaution/Restrictions Comments: LLE would vac, L limb protector Restrictions Weight Bearing Restrictions Per Provider Order: Yes LLE Weight Bearing Per Provider Order: Non weight bearing     Mobility  Bed Mobility Overal bed mobility: Needs Assistance Bed Mobility: Sit to Supine     Supine to sit: Supervision     General bed mobility comments: pt able to get to EoB with assist for management of lines and  leads    Transfers Overall transfer level: Needs assistance Equipment used: Rolling walker (2 wheels), 2 person hand held assist Transfers: Bed to chair/wheelchair/BSC Sit to Stand: Min assist, Mod assist, +2 physical assistance Stand pivot transfers: Total assist, +2 physical assistance, Max assist         General transfer comment: worked on power up to [employer],  provided min-A to [employer], pt unsure about use of RW requiring increased cuing for positioning, worked on finding new CoG in [employer], completed x4, on 4th time attempted pivot to chair unable to off weight R foot enought to pivot on foot, 5th time placed washcloth under R foot, and provided 2xHHA       Balance Overall balance assessment: Needs assistance   Sitting balance-Leahy Scale: Good       Standing balance-Leahy Scale: Poor Standing balance comment: reliant on the UE support                            Communication Communication Communication: No apparent difficulties  Cognition Arousal: Alert Behavior During Therapy: WFL for tasks assessed/performed   PT - Cognitive impairments: No apparent impairments                         Following commands: Intact      Cueing Cueing Techniques: Verbal cues, Tactile cues     General Comments General comments (skin integrity, edema, etc.): VSS on RA, pt with increased drainage through leaky wound vac RN aware      Pertinent Vitals/Pain Pain Assessment Pain Assessment: Faces Faces Pain Scale: Hurts little more Pain Location: LLE with bed mobility Pain  Descriptors / Indicators: Grimacing Pain Intervention(s): Limited activity within patient's tolerance, Monitored during session, Repositioned    Home Living                          Prior Function            PT Goals (current goals can now be found in the care plan section) Acute Rehab PT Goals PT Goal Formulation: With patient Time For Goal Achievement: 01/02/24 Potential to Achieve  Goals: Good Progress towards PT goals: Progressing toward goals    Frequency    Min 2X/week       AM-PAC PT 6 Clicks Mobility   Outcome Measure  Help needed turning from your back to your side while in a flat bed without using bedrails?: A Little Help needed moving from lying on your back to sitting on the side of a flat bed without using bedrails?: A Little Help needed moving to and from a bed to a chair (including a wheelchair)?: A Lot Help needed standing up from a chair using your arms (e.g., wheelchair or bedside chair)?: A Lot Help needed to walk in hospital room?: Total Help needed climbing 3-5 steps with a railing? : Total 6 Click Score: 12    End of Session Equipment Utilized During Treatment: Gait belt Activity Tolerance: Patient tolerated treatment well Patient left: with call bell/phone within reach;with nursing/sitter in room;in chair Nurse Communication: Mobility status PT Visit Diagnosis: Other abnormalities of gait and mobility (R26.89);Muscle weakness (generalized) (M62.81);Pain Pain - Right/Left: Left Pain - part of body: Leg     Time: 1200-1228 PT Time Calculation (min) (ACUTE ONLY): 28 min  Charges:    $Therapeutic Activity: 23-37 mins PT General Charges $$ ACUTE PT VISIT: 1 Visit                     Emileo Semel B. Jewel Mortimer PT, DPT Acute Rehabilitation Services Please use secure chat or  Call Office [phone]    [name] [facility] 12/25/2023, 1:36 PM

## 2023-12-25 NOTE — Progress Notes (Addendum)
 Inpatient Rehab Admissions Coordinator:   Awaiting determination from [facility].  When I called last night, RN on call was able to determine case had been sent to medical director for review.  I won't have a bed for her today but could potentially prioritize tomorrow if she is approved.   Loye Rumble, PT, DPT Admissions Coordinator [phone] 12/25/23  11:07 AM

## 2023-12-25 NOTE — Consult Note (Signed)
 WOC team consulted for leaking VAC.  WOC team is not following this patient.  Dr. Julio Ohm performed a L BKA on 12/18/2023 and placed a Prevena Peel and Place Wound Vac at that time.    Secure chat to primary nurse that Dr. Julio Ohm will need to be notified regarding this issue.   Thank you,    Ronni Colace MSN, RN-BC, [employer] [phone]

## 2023-12-26 DIAGNOSIS — T8149XA Infection following a procedure, other surgical site, initial encounter: Secondary | ICD-10-CM | POA: Diagnosis not present

## 2023-12-26 LAB — GLUCOSE, CAPILLARY
Glucose-Capillary: 156 mg/dL — ABNORMAL HIGH (ref 70–99)
Glucose-Capillary: 112 mg/dL — ABNORMAL HIGH (ref 70–99)
Glucose-Capillary: 116 mg/dL — ABNORMAL HIGH (ref 70–99)
Glucose-Capillary: 112 mg/dL — ABNORMAL HIGH (ref 70–99)

## 2023-12-26 MED ORDER — OXYCODONE HCL 5 MG PO TABS
7.5000 mg | ORAL_TABLET | ORAL | Status: DC | PRN
  Administered 2023-12-26 – 2023-12-27 (×4): 7.5 mg via ORAL
  Filled 2023-12-26 (×4): qty 2

## 2023-12-26 MED ORDER — HYDROMORPHONE HCL 1 MG/ML IJ SOLN
0.5000 mg | Freq: Once | INTRAMUSCULAR | Status: AC
  Administered 2023-12-26: 0.5 mg via INTRAVENOUS
  Filled 2023-12-26: qty 0.5

## 2023-12-26 NOTE — Assessment & Plan Note (Signed)
 Continue CPAP at night

## 2023-12-26 NOTE — Assessment & Plan Note (Signed)
 Generalized anxiety disorder - Continue BuSpar  and Lexapro 

## 2023-12-26 NOTE — H&P (Incomplete)
 Physical Medicine and Rehabilitation Admission H&P    No chief complaint on file. : Functional Debility    HPI: Dana [name] is a 63 year old female with PMHx of CAD, IDDM, HFmrEF,   After admission pt noted to have drainage from surgical wound, orthopedic consulted for evaluation and found MRSA bacteremia with recommendations for BKA    ROS Past Medical History:  Diagnosis Date   Acquired absence of left great toe (HCC)    Acquired absence of other left toe(s) (HCC)    ACS (acute coronary syndrome) (HCC)    Acute bronchitis due to COVID-19 virus 03/03/2023   Acute on chronic combined systolic and diastolic CHF (congestive heart failure) (HCC) 09/16/2023   Acute respiratory failure with hypoxia (HCC) 07/20/2023   Anemia    Anoxic brain injury (HCC)    Anxiety    CAD (coronary artery disease)    DES mLAD 04/07/15; DES dRCA & DES pPDA 08/30/16; DES mCX & PTCA OM1 09/29/20   Charcot's joint of foot in type 2 diabetes mellitus (HCC) 12/15/2023   Chronic diastolic (congestive) heart failure (HCC)    Chronic pain    Chronic systolic (congestive) heart failure (HCC)    CKD (chronic kidney disease)    Contrast dye induced nephropathy, possible 09/03/2020   COPD (chronic obstructive pulmonary disease) (HCC)    COVID-19 virus infection 03/03/2023   Demand ischemia (HCC)    Diabetes (HCC)type 1    Diastolic CHF (HCC)    Dyspnea    Encounter for screening mammogram for malignant neoplasm of breast 09/16/2023   Family history of breast cancer 10/23/2021   Gastro-esophageal reflux disease without esophagitis    Hyperlipidemia    Hypertension    Hypertensive heart disease with heart failure (HCC)    Hypoxemia    Idiopathic gout, multiple sites    Leukocytosis 03/29/2022   Localized edema    Low back pain    Mitral regurgitation    Mitral regurgitation    Mixed hyperlipidemia    Mixed incontinence    Morbid (severe) obesity due to excess calories (HCC)    Neuromuscular  disorder (HCC)    Neuropathy    Non-ST elevation (NSTEMI) myocardial infarction (HCC)    08/29/20, 09/29/20   OSA (obstructive sleep apnea) 09/03/2020   Other specified hypothyroidism    PAF (paroxysmal atrial fibrillation) (HCC)    s/p pulmonary vein isolation by cryoablation 10/04/15 Dr. Jearldine Mina in [facility]   PEA (Pulseless electrical activity) [facility])    ~ 01/13/21 at [facility] during admission DKA, volume overload, possible CAP, hypoxia s/p ACLS with ROSC ~ 10-15   Pneumonia    Primary insomnia    Rheumatoid arthritis (HCC)    Stroke (HCC)    Thyroid  disease    Type 1 diabetes mellitus (HCC)    URI with cough and congestion 08/22/2023   Vitamin D  deficiency    Past Surgical History:  Procedure Laterality Date   ABDOMINAL HYSTERECTOMY     Partial- Still has both ovaries   AMPUTATION Left 12/18/2023   Procedure: AMPUTATION BELOW KNEE;  Surgeon: Timothy Ford, MD;  Location: [facility] OR;  Service: Orthopedics;  Laterality: Left;   ANKLE FUSION Left 11/13/2023   Procedure: HINDFOOT FUSION FIXATION OF ANKLE;  Surgeon: Laneta Pintos, MD;  Location: [facility] OR;  Service: Orthopedics;  Laterality: Left;   CARDIOVASCULAR STRESS TEST  08/13/2014   Nuclear; Normal   CARPAL TUNNEL RELEASE     CATARACT EXTRACTION, BILATERAL  CESAREAN SECTION     CHOLECYSTECTOMY     CORONARY ANGIOPLASTY WITH STENT PLACEMENT     3 blockages/ 1 stent   CORONARY BALLOON ANGIOPLASTY N/A 07/18/2023   Procedure: CORONARY BALLOON ANGIOPLASTY;  Surgeon: Odie Benne, MD;  Location: [facility] INVASIVE CV LAB;  Service: Cardiovascular;  Laterality: N/A;   CORONARY PRESSURE/FFR STUDY N/A 09/07/2022   Procedure: INTRAVASCULAR PRESSURE WIRE/FFR STUDY;  Surgeon: Swaziland, Peter M, MD;  Location: [facility] INVASIVE CV LAB;  Service: Cardiovascular;  Laterality: N/A;   CORONARY STENT INTERVENTION N/A 09/29/2020   Procedure: CORONARY STENT INTERVENTION;  Surgeon: Arnoldo Lapping, MD;  Location: [facility] INVASIVE CV LAB;  Service:  Cardiovascular;  Laterality: N/A;  CFX   CORONARY STENT INTERVENTION N/A 09/07/2022   Procedure: CORONARY STENT INTERVENTION;  Surgeon: Swaziland, Peter M, MD;  Location: [facility] INVASIVE CV LAB;  Service: Cardiovascular;  Laterality: N/A;   CORONARY STENT INTERVENTION N/A 02/13/2023   Procedure: CORONARY STENT INTERVENTION;  Surgeon: Odie Benne, MD;  Location: [facility] INVASIVE CV LAB;  Service: Cardiovascular;  Laterality: N/A;  LAD   CORONARY ULTRASOUND/IVUS N/A 02/13/2023   Procedure: Coronary Ultrasound/IVUS;  Surgeon: Odie Benne, MD;  Location: [facility] INVASIVE CV LAB;  Service: Cardiovascular;  Laterality: N/A;   CORONARY/GRAFT ACUTE MI REVASCULARIZATION N/A 12/08/2022   Procedure: Coronary/Graft Acute MI Revascularization;  Surgeon: Arleen Lacer, MD;  Location: [facility] INVASIVE CV LAB;  Service: Cardiovascular;  Laterality: N/A;   EYE SURGERY     LEFT HEART CATH AND CORONARY ANGIOGRAPHY N/A 09/29/2020   Procedure: LEFT HEART CATH AND CORONARY ANGIOGRAPHY;  Surgeon: Arnoldo Lapping, MD;  Location: [facility] INVASIVE CV LAB;  Service: Cardiovascular;  Laterality: N/A;   LEFT HEART CATH AND CORONARY ANGIOGRAPHY N/A 09/07/2022   Procedure: LEFT HEART CATH AND CORONARY ANGIOGRAPHY;  Surgeon: Swaziland, Peter M, MD;  Location: [facility] INVASIVE CV LAB;  Service: Cardiovascular;  Laterality: N/A;   LEFT HEART CATH AND CORONARY ANGIOGRAPHY N/A 12/08/2022   Procedure: LEFT HEART CATH AND CORONARY ANGIOGRAPHY;  Surgeon: Arleen Lacer, MD;  Location: [facility] INVASIVE CV LAB;  Service: Cardiovascular;  Laterality: N/A;   LEFT HEART CATH AND CORONARY ANGIOGRAPHY N/A 02/13/2023   Procedure: LEFT HEART CATH AND CORONARY ANGIOGRAPHY;  Surgeon: Odie Benne, MD;  Location: [facility] INVASIVE CV LAB;  Service: Cardiovascular;  Laterality: N/A;   LEFT HEART CATH AND CORONARY ANGIOGRAPHY N/A 07/16/2023   Procedure: LEFT HEART CATH AND CORONARY ANGIOGRAPHY;  Surgeon: Odie Benne, MD;  [facility]: [facility] INVASIVE  CV LAB;  Service: Cardiovascular;  Laterality: N/A;   RIGHT HEART CATH N/A 04/27/2021   Procedure: RIGHT HEART CATH;  Surgeon: Mardell Shade, MD;  Location: [facility] INVASIVE CV LAB;  Service: Cardiovascular;  Laterality: N/A;   RIGHT/LEFT HEART CATH AND CORONARY ANGIOGRAPHY N/A 01/07/2017   Procedure: Right/Left Heart Cath and Coronary Angiography;  Surgeon: Darlis Eisenmenger, MD;  Location: [facility] INVASIVE CV LAB;  Service: Cardiovascular;  Laterality: N/A;   ROTATOR CUFF REPAIR     TEE WITHOUT CARDIOVERSION N/A 04/28/2021   Procedure: TRANSESOPHAGEAL ECHOCARDIOGRAM (TEE);  Surgeon: Mardell Shade, MD;  Location: [facility] ENDOSCOPY;  Service: Cardiovascular;  Laterality: N/A;   TEE WITHOUT CARDIOVERSION N/A 05/18/2021   Procedure: TRANSESOPHAGEAL ECHOCARDIOGRAM (TEE);  Surgeon: Thukkani, Arun K, MD;  Location: [facility] INVASIVE CV LAB;  Service: Cardiovascular;  Laterality: N/A;   TOE AMPUTATION Left    TRANSCATHETER MITRAL EDGE TO EDGE REPAIR N/A 05/18/2021   Procedure: MITRAL VALVE REPAIR;  Surgeon: Thukkani, Arun K, MD;  Location: [facility] INVASIVE CV LAB;  Service: Cardiovascular;  Laterality: N/A;   TRANSESOPHAGEAL ECHOCARDIOGRAM (CATH LAB) N/A 12/23/2023   Procedure: TRANSESOPHAGEAL ECHOCARDIOGRAM;  Surgeon: Wendie Hamburg, MD;  Location: [facility] INVASIVE CV LAB;  Service: Cardiovascular;  Laterality: N/A;   US  ECHOCARDIOGRAPHY  05/2017   Normal   Family History  Problem Relation Age of Onset   Breast cancer Mother 79       contralateral breast ca dx 68   Coronary artery disease Mother    Hypertension Mother    Hyperlipidemia Mother    Diabetes type II Mother    Lung cancer Mother 59   Diabetes Father    Hypertension Father    Mental illness Sister    Bipolar disorder Other    Depression Other    Schizophrenia Other    Cancer Other        MGF's sisters x2; unknown type   Social History:  reports that she quit smoking about 41 years ago. Her smoking use included cigarettes. She started  smoking about 42 years ago. She has been exposed to tobacco smoke. She has never used smokeless tobacco. She reports that she does not drink alcohol and does not use drugs. Allergies:  Allergies  Allergen Reactions   Naproxen Hives and Rash   Shellfish-Derived Products Hives, Swelling and Other (See Comments)    Angioedema    Strawberry (Diagnostic) Hives   Celecoxib Other (See Comments)    Stomach pain   Doxepin  Hcl Other (See Comments)    Lethargic, sleeping a lot per pt and family   Glucosamine Hives, Swelling and Other (See Comments)    Angioedema    Iron  Hives and Other (See Comments)    IV iron  transfusion - hives - Feb 2022 hospitalization  Patient said she CAN tolerate if given Benadryl  first   Metoclopramide Other (See Comments)    Facial twitching and stuttering   Metolazone  Other (See Comments)    Acute renal failure   Statins Other (See Comments)    Muscle pain   Medications Prior to Admission  Medication Sig Dispense Refill   acetaminophen  (TYLENOL ) 500 MG tablet Take 1,000 mg by mouth every 6 (six) hours as needed for mild pain (pain score 1-3) or headache.     allopurinol  (ZYLOPRIM ) 100 MG tablet Take 0.5 tablets (50 mg total) by mouth daily. 90 tablet 1   aspirin  EC 81 MG tablet Take 1 tablet (81 mg total) by mouth daily. Swallow whole. 30 tablet 12   busPIRone  (BUSPAR ) 15 MG tablet Take 15 mg by mouth 3 (three) times daily.     Continuous Glucose Sensor (FREESTYLE LIBRE 2 SENSOR) MISC Inject 1 device into skin every 14 (fourteen) days. 2 each 2   EPINEPHrine  0.3 mg/0.3 mL IJ SOAJ injection Use as directed for life-threatening allergic reaction. 4 each 3   Evolocumab  (REPATHA  SURECLICK) 140 MG/ML SOAJ Inject 140 mg into the skin every 14 (fourteen) days. 6 mL 1   ezetimibe  (ZETIA ) 10 MG tablet Take 1 tablet (10 mg total) by mouth every evening. 90 tablet 1   gabapentin  (NEURONTIN ) 300 MG capsule Take 1 capsule (300 mg total) by mouth 3 (three) times daily. 90  capsule 1   Glucagon  (GVOKE HYPOPEN  2-PACK) 0.5 MG/0.1ML SOAJ Inject 0.5 mg into the skin daily as needed. (Patient taking differently: Inject 0.5 mg into the skin daily as needed (hypoglycemia).) 0.2 mL 5   HYDROcodone -acetaminophen  (NORCO) 7.5-325 MG tablet Take 1 tablet by mouth 3 (three)  times daily as needed. 90 tablet 0   insulin  aspart (NOVOLOG  FLEXPEN) 100 UNIT/ML FlexPen Inject 2 - 10 Units before meals based on SSI. 15 mL 3   insulin  glargine (LANTUS  SOLOSTAR) 100 UNIT/ML Solostar Pen Inject 45 Units into the skin daily. (Patient taking differently: Inject 30 Units into the skin daily.)     Insulin  Pen Needle (BD PEN NEEDLE NANO 2ND GEN) 32G X 4 MM MISC Inject 1 each into the skin in the morning, at noon, in the evening, and at bedtime. 600 each 3   latanoprost  (XALATAN ) 0.005 % ophthalmic solution Place 1 drop into both eyes at bedtime. 2.5 mL 0   linaclotide  (LINZESS ) 145 MCG CAPS capsule Take 1 capsule (145 mcg total) by mouth daily before breakfast. (Patient taking differently: Take 145 mcg by mouth as needed (constipation).) 90 capsule 1   LORazepam  (ATIVAN ) 0.5 MG tablet Take 1 tablet (0.5 mg total) by mouth daily as needed for anxiety. 90 tablet 1   magnesium  oxide (MAG-OX) 400 MG tablet Take 2 tablets (800 mg total) by mouth daily. Take 2 (400 mg) daily=800 mg 180 tablet 1   Menthol -Methyl Salicylate  (ICY HOT EXTRA STRENGTH) 10-30 % CREA Apply 1 Application topically as needed (bilateral hips and knees). 85 g 1   nitroGLYCERIN  (NITROSTAT ) 0.4 MG SL tablet DISSOLVE ONE TABLET UNDER THE TONGUE EVERY 5 MINUTES AS NEEDED FOR CHEST PAIN.  DO NOT EXCEED A TOTAL OF 3 DOSES IN 15 MINUTES 50 tablet 3   Omega-3 Fatty Acids (FISH OIL PO) Take 1 capsule by mouth daily.     OXYGEN  Inhale 2 L/min into the lungs at bedtime.     pantoprazole  (PROTONIX ) 40 MG tablet Take 1 tablet (40 mg total) by mouth daily. 90 tablet 1   potassium chloride  SA (KLOR-CON  M) 20 MEQ tablet Take 1 tablet (20 mEq total)  by mouth daily.     Probiotic CAPS Take 1 capsule by mouth daily.     promethazine  (PHENERGAN ) 25 MG tablet Take 1 tablet (25 mg total) by mouth daily as needed. 90 tablet 1   ticagrelor  (BRILINTA ) 90 MG TABS tablet Take 1 tablet (90 mg total) by mouth 2 (two) times daily. 180 tablet 1   torsemide  (DEMADEX ) 20 MG tablet Take 3 tablets (60 mg total) by mouth daily. 90 tablet 3   vitamin D3 (CHOLECALCIFEROL ) 25 MCG tablet Take 2 tablets (2,000 Units total) by mouth 2 (two) times daily. 120 tablet 2   carvedilol  (COREG ) 6.25 MG tablet Take 1.5 tablets (9.375 mg total) by mouth 2 (two) times daily. 90 tablet 3   doxepin  (SINEQUAN ) 150 MG capsule Take 1 capsule (150 mg total) by mouth at bedtime. (Patient not taking: Reported on 12/14/2023) 30 capsule 3   isosorbide  mononitrate (IMDUR ) 30 MG 24 hr tablet Take 1 tablet (30 mg total) by mouth daily. 30 tablet 5   methocarbamol  (ROBAXIN ) 500 MG tablet Take 1 tablet (500 mg total) by mouth every 6 (six) hours as needed for muscle spasms. (Patient not taking: Reported on 12/14/2023) 28 tablet 0   orphenadrine  (NORFLEX ) 100 MG tablet Take 1 tablet (100 mg total) by mouth 2 (two) times daily. (Patient not taking: Reported on 12/14/2023) 60 tablet 0      Home: Home Living Family/patient expects to be discharged to:: Private residence Living Arrangements: Spouse/significant other, Parent Available Help at Discharge: Family, Available 24 hours/day Type of Home: House Home Access: Ramped entrance Home Layout: Able to live on main level  with bedroom/bathroom Bathroom Shower/Tub: Health visitor: Standard Home Equipment: Agricultural consultant (2 wheels), Shower seat, Grab bars - tub/shower, Higher education careers adviser held shower head, Cane - single point, Wheelchair - manual, BSC/3in1, Rollator (4 wheels)   Functional History: Prior Function Prior Level of Function : Needs assist, History of Falls (last six months) Mobility Comments: Transfers to [facility], onto knee scooter.  Not using W/c in house ADLs Comments: has been using BSC since ankle injury, needing assist for LB dressing since ankle injury but typically manages ADls and light IADLs  Functional Status:  Mobility: Bed Mobility Overal bed mobility: Needs Assistance Bed Mobility: Sit to Supine Supine to sit: Supervision Sit to supine: Contact guard assist General bed mobility comments: pt able to get to EoB with assist for management of lines and leads Transfers Overall transfer level: Needs assistance Equipment used: Rolling walker (2 wheels), 2 person hand held assist Transfers: Bed to chair/wheelchair/BSC Sit to Stand: Min assist, Mod assist, +2 physical assistance Bed to/from chair/wheelchair/BSC transfer type:: Squat pivot Stand pivot transfers: Total assist, +2 physical assistance, Max assist Squat pivot transfers: Mod assist  Lateral/Scoot Transfers: Min assist General transfer comment: worked on power up to [employer],  provided min-A to [employer], pt unsure about use of RW requiring increased cuing for positioning, worked on finding new CoG in [employer], completed x4, on 4th time attempted pivot to chair unable to off weight R foot enought to pivot on foot, 5th time placed washcloth under R foot, and provided 2xHHA Ambulation/Gait Pre-gait activities: pt worked on upright posture, w/shifting and transfer technque    ADL: ADL Overall ADL's : Needs assistance/impaired Eating/Feeding: Independent Grooming: Set up, Sitting Upper Body Bathing: Set up, Sitting Lower Body Bathing: Maximal assistance, +2 for safety/equipment, +2 for physical assistance, Sit to/from stand Upper Body Dressing : Set up, Sitting Upper Body Dressing Details (indicate cue type and reason): gown to cover back Lower Body Dressing: Maximal assistance, +2 for physical assistance, +2 for safety/equipment, Sit to/from stand Lower Body Dressing Details (indicate cue type and reason): able to donn right shoe sitting on EOB Toilet Transfer:  Moderate assistance, Doctor, hospital Details (indicate cue type and reason): simulated to recliner Toileting- Clothing Manipulation and Hygiene: Total assistance, Bed level  Cognition: Cognition Orientation Level: Oriented X4 Cognition Arousal: Alert Behavior During Therapy: WFL for tasks assessed/performed  Physical Exam: Blood pressure (!) 98/43, pulse (!) 59, temperature 98.2 F (36.8 C), resp. rate 18, height 5' 5 (1.651 m), weight 98 kg, SpO2 94%. Physical Exam  Results for orders placed or performed during the hospital encounter of 12/14/23 (from the past 48 hours)  Glucose, capillary     Status: Abnormal   Collection Time: 12/24/23  5:27 PM  Result Value Ref Range   Glucose-Capillary 164 (H) 70 - 99 mg/dL    Comment: Glucose reference range applies only to samples taken after fasting for at least 8 hours.  Glucose, capillary     Status: Abnormal   Collection Time: 12/24/23  8:29 PM  Result Value Ref Range   Glucose-Capillary 139 (H) 70 - 99 mg/dL    Comment: Glucose reference range applies only to samples taken after fasting for at least 8 hours.  Magnesium      Status: None   Collection Time: 12/25/23  4:00 AM  Result Value Ref Range   Magnesium  1.8 1.7 - 2.4 mg/dL    Comment: Performed at [facility], [address] [place], [place] [address]  Basic metabolic panel  with GFR     Status: Abnormal   Collection Time: 12/25/23  4:00 AM  Result Value Ref Range   Sodium 134 (L) 135 - 145 mmol/L   Potassium 4.6 3.5 - 5.1 mmol/L   Chloride 95 (L) 98 - 111 mmol/L   CO2 28 22 - 32 mmol/L   Glucose, Bld 177 (H) 70 - 99 mg/dL    Comment: Glucose reference range applies only to samples taken after fasting for at least 8 hours.   BUN 53 (H) 6 - 20 mg/dL   Creatinine, Ser 1.61 (H) 0.44 - 1.00 mg/dL   Calcium  8.3 (L) 8.9 - 10.3 mg/dL   GFR, Estimated 24 (L) >60 mL/min    Comment: (NOTE) Calculated using the CKD-EPI Creatinine Equation (2021)    Anion gap 11  5 - 15    Comment: Performed at [facility], [address] [place], [place] [address]  Glucose, capillary     Status: Abnormal   Collection Time: 12/25/23  7:41 AM  Result Value Ref Range   Glucose-Capillary 145 (H) 70 - 99 mg/dL    Comment: Glucose reference range applies only to samples taken after fasting for at least 8 hours.  Glucose, capillary     Status: Abnormal   Collection Time: 12/25/23 12:09 PM  Result Value Ref Range   Glucose-Capillary 142 (H) 70 - 99 mg/dL    Comment: Glucose reference range applies only to samples taken after fasting for at least 8 hours.  Glucose, capillary     Status: Abnormal   Collection Time: 12/25/23  4:18 PM  Result Value Ref Range   Glucose-Capillary 117 (H) 70 - 99 mg/dL    Comment: Glucose reference range applies only to samples taken after fasting for at least 8 hours.  Glucose, capillary     Status: Abnormal   Collection Time: 12/25/23  6:11 PM  Result Value Ref Range   Glucose-Capillary 133 (H) 70 - 99 mg/dL    Comment: Glucose reference range applies only to samples taken after fasting for at least 8 hours.  Glucose, capillary     Status: Abnormal   Collection Time: 12/25/23  9:59 PM  Result Value Ref Range   Glucose-Capillary 132 (H) 70 - 99 mg/dL    Comment: Glucose reference range applies only to samples taken after fasting for at least 8 hours.  Glucose, capillary     Status: Abnormal   Collection Time: 12/26/23  5:54 AM  Result Value Ref Range   Glucose-Capillary 156 (H) 70 - 99 mg/dL    Comment: Glucose reference range applies only to samples taken after fasting for at least 8 hours.  Glucose, capillary     Status: Abnormal   Collection Time: 12/26/23 12:05 PM  Result Value Ref Range   Glucose-Capillary 112 (H) 70 - 99 mg/dL    Comment: Glucose reference range applies only to samples taken after fasting for at least 8 hours.  Glucose, capillary     Status: Abnormal   Collection Time: 12/26/23  4:20 PM  Result Value  Ref Range   Glucose-Capillary 116 (H) 70 - 99 mg/dL    Comment: Glucose reference range applies only to samples taken after fasting for at least 8 hours.   *Note: Due to a large number of results and/or encounters for the requested time period, some results have not been displayed. A complete set of results can be found in Results Review.   No results found.  Blood pressure (!) 98/43, pulse (!) 59, temperature 98.2 F (36.8 C), resp. rate 18, height 5' 5 (1.651 m), weight 98 kg, SpO2 94%.  Medical Problem List and Plan: 1. Functional deficits secondary to ***  -patient may *** shower  -ELOS/Goals: *** 2.  Antithrombotics: -DVT/anticoagulation:  {VTE PROPHYLAXIS/ANTICOAGULATION - ZOXW:[id number]}  -antiplatelet therapy: *** 3. Pain Management: *** 4. Mood/Behavior/Sleep: ***  -antipsychotic agents: *** 5. Neuropsych/cognition: This patient *** capable of making decisions on *** own behalf. 6. Skin/Wound Care: *** 7. Fluids/Electrolytes/Nutrition: ***     ***  Nasario Badder, NP 12/26/2023

## 2023-12-26 NOTE — H&P (Signed)
 Physical Medicine and Rehabilitation Admission H&P     HPI: Dana [name] is a 63 year old right-handed female with history significant for hyperlipidemia, rheumatoid arthritis, CVA, insulin -dependent diabetes mellitus, CKD stage IV, CAD with multiple stents and angioplasty 07/18/2023 as well as history of mitral valve repair, maintained on low-dose aspirin  as well as Brilinta , chronic hypoxic respiratory failure, OSA on CPAP and quit smoking 41 years ago, chronic diastolic congestive heart failure, depression/anxiety, chronic pain, left trimalleolar ankle fracture/dislocation status post repair on 11/13/2023.  Per chart review patient lives with parent.  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  She is able to live on the main level of the home with bedroom and bathroom.  There is a ramped entrance.  Presented 12/14/2023 from [facility] after presenting to the ED 5/29 for fevers, dysuria and flank pain.  She was started on IV fluids and broad-spectrum antibiotics for suspected UTI with CT abdomen pelvis showing bladder wall thickening on CT as well as  noted to have purulent drainage from left heel wound where cultures grew MRSA.  She was transferred to [facility].  Blood culture showed no growth, lactic acid within normal limits, sodium 134, glucose 137, BUN 50, creatinine 2.25, hemoglobin 7.2, WBC 19,600.  Infectious disease consulted placed on vancomycin .  Chest x-ray showed prominent cardiac silhouette and vascular congestion.  CT renal stone study showed no hydronephrosis.  Enlarged left ovary with surrounding inflammatory stranding and fluid.  Transabdominal ultrasound of the pelvis showed nonvisualization of the ovaries bilaterally.  Trace simple appearing free fluid within the right adnexa, nonspecific.  Nuclear medicine perfusion lung scan no evidence of acute pulmonary emboli.  Venous Doppler studies lower extremities  negative for DVT.  Dr. Julio Ohm follow-up for left heel wound and x-ray showed postoperative changes disrupted ankle mortise soft tissue swelling.  Limb was not felt to be salvageable preoperative cardiac clearance obtained and underwent left BKA 12/18/2023 per Dr. Julio Ohm with wound VAC applied and orders to CONTINUE WOUND VAC WITH PREVENA PLUS PUMP AT DISCHARGE FOR 1 WEEK.Aaron Aas  Hospital course acute on chronic anemia latest hemoglobin 8.8.  Maintained on daptomycin  x 5 days postoperatively.  Leukocytosis improved to 12,600.  Renal function remained stable with latest creatinine 2.31.  Patient was cleared for subcutaneous heparin  for DVT prophylaxis.  Her aspirin  and Brilinta  were resumed postoperatively.  Patient did receive psychiatry consult during hospitalization for depression remained on Ativan  as needed as well as Lexapro /BuSpar .  Therapy evaluations completed due to patient decreased functional mobility was admitted for a comprehensive rehab program.  Review of Systems  Constitutional:  Positive for chills and fever.  HENT:  Negative for hearing loss.   Eyes:  Negative for blurred vision and double vision.  Respiratory:  Negative for cough, shortness of breath and wheezing.   Cardiovascular:  Positive for leg swelling. Negative for chest pain and palpitations.  Gastrointestinal:  Positive for constipation. Negative for heartburn, nausea and vomiting.  Genitourinary:  Positive for urgency. Negative for dysuria, flank pain and hematuria.       Stress urinary incontinence  Musculoskeletal:  Positive for back pain, joint pain and myalgias.  Skin:  Negative for rash.  Neurological:  Positive for tingling and weakness.  Psychiatric/Behavioral:  Positive for depression. The patient has insomnia.        Anxiety  All other systems reviewed and are negative.  Past Medical History:  Diagnosis Date   Acquired  absence of left great toe [facility])    Acquired absence of other left toe(s) (HCC)    ACS (acute coronary  syndrome) (HCC)    Acute bronchitis due to COVID-19 virus 03/03/2023   Acute on chronic combined systolic and diastolic CHF (congestive heart failure) (HCC) 09/16/2023   Acute respiratory failure with hypoxia (HCC) 07/20/2023   Anemia    Anoxic brain injury (HCC)    Anxiety    CAD (coronary artery disease)    DES mLAD 04/07/15; DES dRCA & DES pPDA 08/30/16; DES mCX & PTCA OM1 09/29/20   Charcot's joint of foot in type 2 diabetes mellitus (HCC) 12/15/2023   Chronic diastolic (congestive) heart failure (HCC)    Chronic pain    Chronic systolic (congestive) heart failure (HCC)    CKD (chronic kidney disease)    Contrast dye induced nephropathy, possible 09/03/2020   COPD (chronic obstructive pulmonary disease) (HCC)    COVID-19 virus infection 03/03/2023   Demand ischemia (HCC)    Diabetes (HCC)type 1    Diastolic CHF (HCC)    Dyspnea    Encounter for screening mammogram for malignant neoplasm of breast 09/16/2023   Family history of breast cancer 10/23/2021   Gastro-esophageal reflux disease without esophagitis    Hyperlipidemia    Hypertension    Hypertensive heart disease with heart failure (HCC)    Hypoxemia    Idiopathic gout, multiple sites    Leukocytosis 03/29/2022   Localized edema    Low back pain    Mitral regurgitation    Mitral regurgitation    Mixed hyperlipidemia    Mixed incontinence    Morbid (severe) obesity due to excess calories (HCC)    Neuromuscular disorder (HCC)    Neuropathy    Non-ST elevation (NSTEMI) myocardial infarction (HCC)    08/29/20, 09/29/20   OSA (obstructive sleep apnea) 09/03/2020   Other specified hypothyroidism    PAF (paroxysmal atrial fibrillation) (HCC)    s/p pulmonary vein isolation by cryoablation 10/04/15 Dr. Jearldine Mina in [facility]   PEA (Pulseless electrical activity) [facility])    ~ 01/13/21 at [facility] during admission DKA, volume overload, possible CAP, hypoxia s/p ACLS with ROSC ~ 10-15   Pneumonia    Primary insomnia     Rheumatoid arthritis (HCC)    Stroke (HCC)    Thyroid  disease    Type 1 diabetes mellitus (HCC)    URI with cough and congestion 08/22/2023   Vitamin D  deficiency    Past Surgical History:  Procedure Laterality Date   ABDOMINAL HYSTERECTOMY     Partial- Still has both ovaries   AMPUTATION Left 12/18/2023   Procedure: AMPUTATION BELOW KNEE;  Surgeon: Timothy Ford, MD;  Location: [facility] OR;  Service: Orthopedics;  Laterality: Left;   ANKLE FUSION Left 11/13/2023   Procedure: HINDFOOT FUSION FIXATION OF ANKLE;  Surgeon: Laneta Pintos, MD;  Location: [facility] OR;  Service: Orthopedics;  Laterality: Left;   CARDIOVASCULAR STRESS TEST  08/13/2014   Nuclear; Normal   CARPAL TUNNEL RELEASE     CATARACT EXTRACTION, BILATERAL     CESAREAN SECTION     CHOLECYSTECTOMY     CORONARY ANGIOPLASTY WITH STENT PLACEMENT     3 blockages/ 1 stent   CORONARY BALLOON ANGIOPLASTY N/A 07/18/2023   Procedure: CORONARY BALLOON ANGIOPLASTY;  Surgeon: Odie Benne, MD;  Location: [facility] INVASIVE CV LAB;  Service: Cardiovascular;  Laterality: N/A;   CORONARY PRESSURE/FFR STUDY N/A 09/07/2022   Procedure: INTRAVASCULAR PRESSURE WIRE/FFR STUDY;  Surgeon: Swaziland, Peter M, MD;  Location: [facility] INVASIVE CV LAB;  Service: Cardiovascular;  Laterality: N/A;   CORONARY STENT INTERVENTION N/A 09/29/2020   Procedure: CORONARY STENT INTERVENTION;  Surgeon: Arnoldo Lapping, MD;  Location: [facility] INVASIVE CV LAB;  Service: Cardiovascular;  Laterality: N/A;  CFX   CORONARY STENT INTERVENTION N/A 09/07/2022   Procedure: CORONARY STENT INTERVENTION;  Surgeon: Swaziland, Peter M, MD;  Location: [facility] INVASIVE CV LAB;  Service: Cardiovascular;  Laterality: N/A;   CORONARY STENT INTERVENTION N/A 02/13/2023   Procedure: CORONARY STENT INTERVENTION;  Surgeon: Odie Benne, MD;  Location: [facility] INVASIVE CV LAB;  Service: Cardiovascular;  Laterality: N/A;  LAD   CORONARY ULTRASOUND/IVUS N/A 02/13/2023   Procedure: Coronary Ultrasound/IVUS;   Surgeon: Odie Benne, MD;  Location: [facility] INVASIVE CV LAB;  Service: Cardiovascular;  Laterality: N/A;   CORONARY/GRAFT ACUTE MI REVASCULARIZATION N/A 12/08/2022   Procedure: Coronary/Graft Acute MI Revascularization;  Surgeon: Arleen Lacer, MD;  Location: [facility] INVASIVE CV LAB;  Service: Cardiovascular;  Laterality: N/A;   EYE SURGERY     LEFT HEART CATH AND CORONARY ANGIOGRAPHY N/A 09/29/2020   Procedure: LEFT HEART CATH AND CORONARY ANGIOGRAPHY;  Surgeon: Arnoldo Lapping, MD;  Location: [name] INVASIVE CV LAB;  Service: Cardiovascular;  Laterality: N/A;   LEFT HEART CATH AND CORONARY ANGIOGRAPHY N/A 09/07/2022   Procedure: LEFT HEART CATH AND CORONARY ANGIOGRAPHY;  Surgeon: Swaziland, Peter M, MD;  Location: [facility] INVASIVE CV LAB;  Service: Cardiovascular;  Laterality: N/A;   LEFT HEART CATH AND CORONARY ANGIOGRAPHY N/A 12/08/2022   Procedure: LEFT HEART CATH AND CORONARY ANGIOGRAPHY;  Surgeon: Arleen Lacer, MD;  Location: [facility] INVASIVE CV LAB;  Service: Cardiovascular;  Laterality: N/A;   LEFT HEART CATH AND CORONARY ANGIOGRAPHY N/A 02/13/2023   Procedure: LEFT HEART CATH AND CORONARY ANGIOGRAPHY;  Surgeon: Odie Benne, MD;  Location: [facility] INVASIVE CV LAB;  Service: Cardiovascular;  Laterality: N/A;   LEFT HEART CATH AND CORONARY ANGIOGRAPHY N/A 07/16/2023   Procedure: LEFT HEART CATH AND CORONARY ANGIOGRAPHY;  Surgeon: Odie Benne, MD;  Location: [facility] INVASIVE CV LAB;  Service: Cardiovascular;  Laterality: N/A;   RIGHT HEART CATH N/A 04/27/2021   Procedure: RIGHT HEART CATH;  Surgeon: Mardell Shade, MD;  Location: [facility] INVASIVE CV LAB;  Service: Cardiovascular;  Laterality: N/A;   RIGHT/LEFT HEART CATH AND CORONARY ANGIOGRAPHY N/A 01/07/2017   Procedure: Right/Left Heart Cath and Coronary Angiography;  Surgeon: Darlis Eisenmenger, MD;  Location: [facility] INVASIVE CV LAB;  Service: Cardiovascular;  Laterality: N/A;   ROTATOR CUFF REPAIR     TEE WITHOUT CARDIOVERSION N/A  04/28/2021   Procedure: TRANSESOPHAGEAL ECHOCARDIOGRAM (TEE);  Surgeon: Mardell Shade, MD;  Location: [facility] ENDOSCOPY;  Service: Cardiovascular;  Laterality: N/A;   TEE WITHOUT CARDIOVERSION N/A 05/18/2021   Procedure: TRANSESOPHAGEAL ECHOCARDIOGRAM (TEE);  Surgeon: Thukkani, Arun K, MD;  Location: [facility] INVASIVE CV LAB;  Service: Cardiovascular;  Laterality: N/A;   TOE AMPUTATION Left    TRANSCATHETER MITRAL EDGE TO EDGE REPAIR N/A 05/18/2021   Procedure: MITRAL VALVE REPAIR;  Surgeon: Kyra Phy, MD;  Location: [facility] INVASIVE CV LAB;  Service: Cardiovascular;  Laterality: N/A;   TRANSESOPHAGEAL ECHOCARDIOGRAM (CATH LAB) N/A 12/23/2023   Procedure: TRANSESOPHAGEAL ECHOCARDIOGRAM;  Surgeon: Wendie Hamburg, MD;  Location: [facility] INVASIVE CV LAB;  Service: Cardiovascular;  Laterality: N/A;   US  ECHOCARDIOGRAPHY  05/2017   Normal   Family History  Problem Relation Age of Onset   Breast cancer Mother 54  contralateral breast ca dx 85   Coronary artery disease Mother    Hypertension Mother    Hyperlipidemia Mother    Diabetes type II Mother    Lung cancer Mother 21   Diabetes Father    Hypertension Father    Mental illness Sister    Bipolar disorder Other    Depression Other    Schizophrenia Other    Cancer Other        MGF's sisters x2; unknown type   Social History:  reports that she quit smoking about 41 years ago. Her smoking use included cigarettes. She started smoking about 42 years ago. She has been exposed to tobacco smoke. She has never used smokeless tobacco. She reports that she does not drink alcohol and does not use drugs. Allergies:  Allergies  Allergen Reactions   Naproxen Hives and Rash   Shellfish-Derived Products Hives, Swelling and Other (See Comments)    Angioedema    Strawberry (Diagnostic) Hives   Celecoxib Other (See Comments)    Stomach pain   Doxepin  Hcl Other (See Comments)    Lethargic, sleeping a lot per pt and family   Glucosamine Hives,  Swelling and Other (See Comments)    Angioedema    Iron  Hives and Other (See Comments)    IV iron  transfusion - hives - Feb 2022 hospitalization  Patient said she CAN tolerate if given Benadryl  first   Metoclopramide Other (See Comments)    Facial twitching and stuttering   Metolazone  Other (See Comments)    Acute renal failure   Statins Other (See Comments)    Muscle pain   Medications Prior to Admission  Medication Sig Dispense Refill   acetaminophen  (TYLENOL ) 500 MG tablet Take 1,000 mg by mouth every 6 (six) hours as needed for mild pain (pain score 1-3) or headache.     allopurinol  (ZYLOPRIM ) 100 MG tablet Take 0.5 tablets (50 mg total) by mouth daily. 90 tablet 1   aspirin  EC 81 MG tablet Take 1 tablet (81 mg total) by mouth daily. Swallow whole. 30 tablet 12   busPIRone  (BUSPAR ) 15 MG tablet Take 15 mg by mouth 3 (three) times daily.     Continuous Glucose Sensor (FREESTYLE LIBRE 2 SENSOR) MISC Inject 1 device into skin every 14 (fourteen) days. 2 each 2   EPINEPHrine  0.3 mg/0.3 mL IJ SOAJ injection Use as directed for life-threatening allergic reaction. 4 each 3   Evolocumab  (REPATHA  SURECLICK) 140 MG/ML SOAJ Inject 140 mg into the skin every 14 (fourteen) days. 6 mL 1   ezetimibe  (ZETIA ) 10 MG tablet Take 1 tablet (10 mg total) by mouth every evening. 90 tablet 1   gabapentin  (NEURONTIN ) 300 MG capsule Take 1 capsule (300 mg total) by mouth 3 (three) times daily. 90 capsule 1   Glucagon  (GVOKE HYPOPEN  2-PACK) 0.5 MG/0.1ML SOAJ Inject 0.5 mg into the skin daily as needed. (Patient taking differently: Inject 0.5 mg into the skin daily as needed (hypoglycemia).) 0.2 mL 5   HYDROcodone -acetaminophen  (NORCO) 7.5-325 MG tablet Take 1 tablet by mouth 3 (three) times daily as needed. 90 tablet 0   insulin  aspart (NOVOLOG  FLEXPEN) 100 UNIT/ML FlexPen Inject 2 - 10 Units before meals based on SSI. 15 mL 3   insulin  glargine (LANTUS  SOLOSTAR) 100 UNIT/ML Solostar Pen Inject 45 Units into  the skin daily. (Patient taking differently: Inject 30 Units into the skin daily.)     Insulin  Pen Needle (BD PEN NEEDLE NANO 2ND GEN) 32G X 4 MM  MISC Inject 1 each into the skin in the morning, at noon, in the evening, and at bedtime. 600 each 3   latanoprost  (XALATAN ) 0.005 % ophthalmic solution Place 1 drop into both eyes at bedtime. 2.5 mL 0   linaclotide  (LINZESS ) 145 MCG CAPS capsule Take 1 capsule (145 mcg total) by mouth daily before breakfast. (Patient taking differently: Take 145 mcg by mouth as needed (constipation).) 90 capsule 1   LORazepam  (ATIVAN ) 0.5 MG tablet Take 1 tablet (0.5 mg total) by mouth daily as needed for anxiety. 90 tablet 1   magnesium  oxide (MAG-OX) 400 MG tablet Take 2 tablets (800 mg total) by mouth daily. Take 2 (400 mg) daily=800 mg 180 tablet 1   Menthol -Methyl Salicylate  (ICY HOT EXTRA STRENGTH) 10-30 % CREA Apply 1 Application topically as needed (bilateral hips and knees). 85 g 1   nitroGLYCERIN  (NITROSTAT ) 0.4 MG SL tablet DISSOLVE ONE TABLET UNDER THE TONGUE EVERY 5 MINUTES AS NEEDED FOR CHEST PAIN.  DO NOT EXCEED A TOTAL OF 3 DOSES IN 15 MINUTES 50 tablet 3   Omega-3 Fatty Acids (FISH OIL PO) Take 1 capsule by mouth daily.     OXYGEN  Inhale 2 L/min into the lungs at bedtime.     pantoprazole  (PROTONIX ) 40 MG tablet Take 1 tablet (40 mg total) by mouth daily. 90 tablet 1   potassium chloride  SA (KLOR-CON  M) 20 MEQ tablet Take 1 tablet (20 mEq total) by mouth daily.     Probiotic CAPS Take 1 capsule by mouth daily.     promethazine  (PHENERGAN ) 25 MG tablet Take 1 tablet (25 mg total) by mouth daily as needed. 90 tablet 1   ticagrelor  (BRILINTA ) 90 MG TABS tablet Take 1 tablet (90 mg total) by mouth 2 (two) times daily. 180 tablet 1   torsemide  (DEMADEX ) 20 MG tablet Take 3 tablets (60 mg total) by mouth daily. 90 tablet 3   vitamin D3 (CHOLECALCIFEROL ) 25 MCG tablet Take 2 tablets (2,000 Units total) by mouth 2 (two) times daily. 120 tablet 2   carvedilol   (COREG ) 6.25 MG tablet Take 1.5 tablets (9.375 mg total) by mouth 2 (two) times daily. 90 tablet 3   doxepin  (SINEQUAN ) 150 MG capsule Take 1 capsule (150 mg total) by mouth at bedtime. (Patient not taking: Reported on 12/14/2023) 30 capsule 3   isosorbide  mononitrate (IMDUR ) 30 MG 24 hr tablet Take 1 tablet (30 mg total) by mouth daily. 30 tablet 5   methocarbamol  (ROBAXIN ) 500 MG tablet Take 1 tablet (500 mg total) by mouth every 6 (six) hours as needed for muscle spasms. (Patient not taking: Reported on 12/14/2023) 28 tablet 0   orphenadrine  (NORFLEX ) 100 MG tablet Take 1 tablet (100 mg total) by mouth 2 (two) times daily. (Patient not taking: Reported on 12/14/2023) 60 tablet 0      Home: Home Living Family/patient expects to be discharged to:: Private residence Living Arrangements: Spouse/significant other, Parent Available Help at Discharge: Family, Available 24 hours/day Type of Home: House Home Access: Ramped entrance Home Layout: Able to live on main level with bedroom/bathroom Bathroom Shower/Tub: Health visitor: Standard Home Equipment: Agricultural consultant (2 wheels), Shower seat, Grab bars - tub/shower, Hand held shower head, Cane - single point, Wheelchair - manual, BSC/3in1, Rollator (4 wheels)   Functional History: Prior Function Prior Level of Function : Needs assist, History of Falls (last six months) Mobility Comments: Transfers to [facility], onto knee scooter. Not using W/c in house ADLs Comments: has been using  BSC since ankle injury, needing assist for LB dressing since ankle injury but typically manages ADls and light IADLs  Functional Status:  Mobility: Bed Mobility Overal bed mobility: Needs Assistance Bed Mobility: Sit to Supine Supine to sit: Supervision Sit to supine: Contact guard assist General bed mobility comments: pt able to get to EoB with assist for management of lines and leads Transfers Overall transfer level: Needs assistance Equipment used:  Rolling walker (2 wheels), 2 person hand held assist Transfers: Bed to chair/wheelchair/BSC Sit to Stand: Min assist, Mod assist, +2 physical assistance Bed to/from chair/wheelchair/BSC transfer type:: Squat pivot Stand pivot transfers: Total assist, +2 physical assistance, Max assist Squat pivot transfers: Mod assist  Lateral/Scoot Transfers: Min assist General transfer comment: worked on power up to [employer],  provided min-A to [employer], pt unsure about use of RW requiring increased cuing for positioning, worked on finding new CoG in [employer], completed x4, on 4th time attempted pivot to chair unable to off weight R foot enought to pivot on foot, 5th time placed washcloth under R foot, and provided 2xHHA Ambulation/Gait Pre-gait activities: pt worked on upright posture, w/shifting and transfer technque    ADL: ADL Overall ADL's : Needs assistance/impaired Eating/Feeding: Independent Grooming: Set up, Sitting Upper Body Bathing: Set up, Sitting Lower Body Bathing: Maximal assistance, +2 for safety/equipment, +2 for physical assistance, Sit to/from stand Upper Body Dressing : Set up, Sitting Upper Body Dressing Details (indicate cue type and reason): gown to cover back Lower Body Dressing: Maximal assistance, +2 for physical assistance, +2 for safety/equipment, Sit to/from stand Lower Body Dressing Details (indicate cue type and reason): able to donn right shoe sitting on EOB Toilet Transfer: Moderate assistance, Doctor, hospital Details (indicate cue type and reason): simulated to recliner Toileting- Clothing Manipulation and Hygiene: Total assistance, Bed level  Cognition: Cognition Orientation Level: Oriented X4 Cognition Arousal: Alert Behavior During Therapy: WFL for tasks assessed/performed  Physical Exam: Blood pressure (!) 117/47, pulse 62, temperature 98.1 F (36.7 C), resp. rate 18, height 5' 5 (1.651 m), weight 98 kg, SpO2 95%. Physical Exam Constitutional:       General: She is not in acute distress.    Appearance: She is obese. She is not ill-appearing.  HENT:     Head: Normocephalic and atraumatic.     Right Ear: External ear normal.     Left Ear: External ear normal.     Nose: Nose normal.     Mouth/Throat:     Mouth: Mucous membranes are moist.   Eyes:     Extraocular Movements: Extraocular movements intact.     Pupils: Pupils are equal, round, and reactive to light.    Cardiovascular:     Rate and Rhythm: Normal rate and regular rhythm.     Heart sounds: No murmur heard.    No gallop.  Pulmonary:     Effort: Pulmonary effort is normal. No respiratory distress.     Breath sounds: No wheezing, rhonchi or rales.  Abdominal:     General: There is no distension.     Palpations: Abdomen is soft.   Musculoskeletal:        General: Swelling: left leg in black shrinker with vac. Normal range of motion.     Cervical back: Normal range of motion.     Right lower leg: Edema present.   Skin:    General: Skin is warm and dry.     Coloration: Skin is not jaundiced.     Comments: Wound sealed  with vac, no drainage in cannister    Neurological:     Mental Status: She is alert.     Comments: Alert and oriented x 3. Normal insight and awareness. Intact Memory. Normal language and speech. Cranial nerve exam unremarkable. MMT: BUE 5/5 prox to distal. LLE: HF 3+/5, KE 3/5. RLE 4/5 HF, KE and 5/5 ADF/PF.  Sensory exam normal for light touch and pain in all 4 limbs. No limb ataxia or cerebellar signs. No abnormal tone appreciated.    Psychiatric:        Mood and Affect: Mood normal.        Behavior: Behavior normal.     Results for orders placed or performed during the hospital encounter of 12/14/23 (from the past 48 hours)  Glucose, capillary     Status: Abnormal   Collection Time: 12/25/23  7:41 AM  Result Value Ref Range   Glucose-Capillary 145 (H) 70 - 99 mg/dL    Comment: Glucose reference range applies only to samples taken after fasting  for at least 8 hours.  Glucose, capillary     Status: Abnormal   Collection Time: 12/25/23 12:09 PM  Result Value Ref Range   Glucose-Capillary 142 (H) 70 - 99 mg/dL    Comment: Glucose reference range applies only to samples taken after fasting for at least 8 hours.  Glucose, capillary     Status: Abnormal   Collection Time: 12/25/23  4:18 PM  Result Value Ref Range   Glucose-Capillary 117 (H) 70 - 99 mg/dL    Comment: Glucose reference range applies only to samples taken after fasting for at least 8 hours.  Glucose, capillary     Status: Abnormal   Collection Time: 12/25/23  6:11 PM  Result Value Ref Range   Glucose-Capillary 133 (H) 70 - 99 mg/dL    Comment: Glucose reference range applies only to samples taken after fasting for at least 8 hours.  Glucose, capillary     Status: Abnormal   Collection Time: 12/25/23  9:59 PM  Result Value Ref Range   Glucose-Capillary 132 (H) 70 - 99 mg/dL    Comment: Glucose reference range applies only to samples taken after fasting for at least 8 hours.  Glucose, capillary     Status: Abnormal   Collection Time: 12/26/23  5:54 AM  Result Value Ref Range   Glucose-Capillary 156 (H) 70 - 99 mg/dL    Comment: Glucose reference range applies only to samples taken after fasting for at least 8 hours.  Glucose, capillary     Status: Abnormal   Collection Time: 12/26/23 12:05 PM  Result Value Ref Range   Glucose-Capillary 112 (H) 70 - 99 mg/dL    Comment: Glucose reference range applies only to samples taken after fasting for at least 8 hours.  Glucose, capillary     Status: Abnormal   Collection Time: 12/26/23  4:20 PM  Result Value Ref Range   Glucose-Capillary 116 (H) 70 - 99 mg/dL    Comment: Glucose reference range applies only to samples taken after fasting for at least 8 hours.  Glucose, capillary     Status: Abnormal   Collection Time: 12/26/23 10:12 PM  Result Value Ref Range   Glucose-Capillary 112 (H) 70 - 99 mg/dL    Comment: Glucose  reference range applies only to samples taken after fasting for at least 8 hours.   *Note: Due to a large number of results and/or encounters for the requested time period, some results have  not been displayed. A complete set of results can be found in Results Review.   No results found.    Blood pressure (!) 117/47, pulse 62, temperature 98.1 F (36.7 C), resp. rate 18, height 5' 5 (1.651 m), weight 98 kg, SpO2 95%.  Medical Problem List and Plan: 1. 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     -patient may shower if leg is covered, vac pump away from water  -ELOS/Goals: 7-10 days, mod I w/c level, supervision ambulatory level 2.  Antithrombotics: -DVT/anticoagulation:  Pharmaceutical: Heparin   -antiplatelet therapy: Aspirin  81 mg daily, Brilinta  90 mg twice daily 3. Pain Management: Neurontin  300 mg 3 times daily, oxycodone  as needed  -pt reports her worst pain is from her low back and related left lumbar radiculopathy. No phantom pain currently   -continue above meds. Encouraged oob to recliner/wc as much as she can.    -therapy can work on ROM, posture, etc.  4. Mood/Behavior/Sleep: Lexapro  10 mg daily, BuSpar  15 mg 3 times daily  -mood is positive  -antipsychotic agents: N/A 5. Neuropsych/cognition: This patient is capable of making decisions on her own behalf. 6.  Wound Care/ID:  daptomycin  through 12/28/23 for MRSA bacteremia  -MRSA contact precautions  -pt was changed to traditional vac dressing  6/12 as prevena was not holding suction. Currently no drainage in cannister. Dressing is due for a change Monday. Consider removing Vac at that time if no further drainage in cannister.  7. Fluids/Electrolytes/Nutrition: Routine in and outs with follow-up chemistries 8.  Acute on chronic anemia.   Hgb with drop today to 7.3. minimal sx  -transfuse 1u PRBC today  -recheck in AM  -continue Brilinta  for now 9.  Diabetes mellitus with  peripheral neuropathy.  Hemoglobin A1c 10.2.  NovoLog  3 units 3 times daily, Semglee  5 units daily.  Check blood sugars AC and at bedtime 10.  CAD/stenting/angioplasty as well as history of mitral valve replacement.  Continue aspirin  and Brilinta .  Follow-up cardiology services 11.  Diastolic congestive heart failure.  Coreg  9.375 mg twice daily, Demadex  60 mg daily (hold).  Monitor for any signs of fluid overload  -weigh daily 12.  GERD.  Protonix  13.  Hyperlipidemia.  Zetia  14.  Chronic hypoxic respiratory failure/OSA.  Continue CPAP 15.  CKD stage IV.  Baseline 2.2-2.4.    -Cr bumped today to 2.7  -may be related to anemia  -hold demadex  in am, pending labs 16.  History of gout.  Allopurinol .  Monitor for any gout flares   Sterling Eisenmenger, PA-C 12/27/2023

## 2023-12-26 NOTE — Progress Notes (Addendum)
 Inpatient Rehab Admissions Coordinator:   I received insurance approval yesterday, but I do not have a bed available for this patient to admit today.  Will follow for admit once bed available, hopeful in the next 24-48 hours.  I updated pt at bedside.  She voiced worry that her amputation seemed to be leaking more (seems from wound vac? And WOC nurse consult pending), and that she was having some urinary retention.  We will continue to follow.    Loye Rumble, PT, DPT Admissions Coordinator [phone] 12/26/23  11:03 AM

## 2023-12-26 NOTE — Assessment & Plan Note (Signed)
 Cr stable at baseline.

## 2023-12-26 NOTE — Progress Notes (Signed)
  Progress Note   Patient: Dana [name] DONE YQM:[id number] DOB: 01-Nov-1960 DOA: 12/14/2023     12 DOS: the patient was seen and examined on 12/26/2023 at 8:09AM      Brief hospital course: 63 y.o. F with CKD IV baseline 2.2-2.4, IDDM, history left great toe amputation, CAD, HFmrEF and chronic respiratory failure and recent left trimalleolar ankle fracture/dislocation s/p ORIF 4/30 who presented to OSH 5/29 with fever.    After admission, noted to have drainage from recent surgical wound, so transferred to [facility] for orthopedic evaluation.  Found to have MRSA bacteremia, and Orthopedics recommended BKA.        Assessment and Plan: * Severe sepsis secondary to postoperative left ankle infection and MRSA bacteremia S/P BKA (below knee amputation), left (HCC) MRSA bacteremia - end of therapy with Daptomycin  12/28/2023. - Continue daptomycin     Acute on chronic combined systolic and diastolic CHF (congestive heart failure) (HCC) Acute right heart failure History of MR s/p MitraClip 2022 Respiratory failure ruled out.  Appears euvolemic - Continue torsemide , Coreg   Acute pyelonephritis Completed treatment  Depression with anxiety Generalized anxiety disorder - Continue BuSpar  and Lexapro   Coronary artery disease involving native coronary artery of native heart with angina pectoris (HCC) - Continue aspirin , Coreg , Zetia , Brilinta      CKD stage 4 due to type 1 diabetes mellitus (HCC) - baseline 2.2 - 2.4 Cr stable at baseline  Uncontrolled type 1 diabetes mellitus with hyperglycemia, with long-term current use of insulin  (HCC) Glucose controlled - Continue glargine - Continue Ss corrections - Cotninue gabapentin   OSA on CPAP - Continue CPAP at night          Subjective: No new complaints.  Having some urinary retention.  No fever, no specific complaints.     Physical Exam: BP (!) 108/42 (BP Location: Left Wrist)   Pulse 65   Temp 98.2 F (36.8 C)   Resp 18    Ht 5' 5 (1.651 m)   Wt 98 kg Comment: at [facility]  SpO2 95%   BMI 35.95 kg/m   Adult female, lying in bed, interactive and appropriate RRR, no murmurs, no peripheral edema Respiratory normal, lungs clear without rales or wheeze There is some serosanguineous drainage from the left wound VAC, but no surrounding cellulitis Attention normal, affect appropriate, judgment and insight appear normal, face symmetric, moves upper extremities with normal strength and coordination    Data Reviewed: Glucose normal  Family Communication: None present    Disposition: Status is: Inpatient         Author: Ephriam Hashimoto, MD 12/26/2023 10:48 AM  For on call review [email or web address].

## 2023-12-26 NOTE — Assessment & Plan Note (Signed)
 S/P BKA (below knee amputation), left (HCC) MRSA bacteremia - end of therapy with Daptomycin  12/28/2023. - Continue daptomycin 

## 2023-12-26 NOTE — Plan of Care (Signed)
  Problem: Education: Goal: Knowledge of General Education information will improve Description: Including pain rating scale, medication(s)/side effects and non-pharmacologic comfort measures Outcome: Progressing   Problem: Safety: Goal: Ability to remain free from injury will improve Outcome: Progressing   Problem: Pain Managment: Goal: General experience of comfort will improve and/or be controlled Outcome: Progressing

## 2023-12-26 NOTE — Plan of Care (Signed)
  Problem: Education: Goal: Knowledge of General Education information will improve Description: Including pain rating scale, medication(s)/side effects and non-pharmacologic comfort measures Outcome: Progressing   Problem: Health Behavior/Discharge Planning: Goal: Ability to manage health-related needs will improve Outcome: Progressing   Problem: Clinical Measurements: Goal: Ability to maintain clinical measurements within normal limits will improve Outcome: Progressing Goal: Will remain free from infection Outcome: Progressing Goal: Diagnostic test results will improve Outcome: Progressing   Problem: Activity: Goal: Risk for activity intolerance will decrease Outcome: Progressing   Problem: Nutrition: Goal: Adequate nutrition will be maintained Outcome: Progressing   Problem: Coping: Goal: Level of anxiety will decrease Outcome: Progressing   Problem: Elimination: Goal: Will not experience complications related to bowel motility Outcome: Progressing Goal: Will not experience complications related to urinary retention Outcome: Progressing   Problem: Pain Managment: Goal: General experience of comfort will improve and/or be controlled Outcome: Progressing   Problem: Safety: Goal: Ability to remain free from injury will improve Outcome: Progressing   Problem: Skin Integrity: Goal: Risk for impaired skin integrity will decrease Outcome: Progressing   Problem: Education: Goal: Ability to describe self-care measures that may prevent or decrease complications (Diabetes Survival Skills Education) will improve Outcome: Progressing Goal: Individualized Educational Video(s) Outcome: Progressing   Problem: Coping: Goal: Ability to adjust to condition or change in health will improve Outcome: Progressing   Problem: Fluid Volume: Goal: Ability to maintain a balanced intake and output will improve Outcome: Progressing   Problem: Health Behavior/Discharge  Planning: Goal: Ability to identify and utilize available resources and services will improve Outcome: Progressing Goal: Ability to manage health-related needs will improve Outcome: Progressing   Problem: Metabolic: Goal: Ability to maintain appropriate glucose levels will improve Outcome: Progressing   Problem: Nutritional: Goal: Maintenance of adequate nutrition will improve Outcome: Progressing Goal: Progress toward achieving an optimal weight will improve Outcome: Progressing   Problem: Skin Integrity: Goal: Risk for impaired skin integrity will decrease Outcome: Progressing   Problem: Tissue Perfusion: Goal: Adequacy of tissue perfusion will improve Outcome: Progressing   Problem: Fluid Volume: Goal: Hemodynamic stability will improve Outcome: Progressing   Problem: Clinical Measurements: Goal: Diagnostic test results will improve Outcome: Progressing Goal: Signs and symptoms of infection will decrease Outcome: Progressing   Problem: Respiratory: Goal: Ability to maintain adequate ventilation will improve Outcome: Progressing

## 2023-12-26 NOTE — Assessment & Plan Note (Addendum)
 Acute right heart failure History of MR s/p MitraClip 2022 Respiratory failure ruled out.  Appears euvolemic - Continue torsemide , Coreg 

## 2023-12-26 NOTE — Consult Note (Signed)
 WOC Nurse Consult Note: Reason for Consult:Left BKA with MRSA bactremia.  VAC in place [facility]) but not holding suction.  Will remove and transition to traditional VAC dressing Wound type:surgical Pressure Injury POA: NA Measurement: 18 cm staple line, intact, edges approximated with some oozing of serosanguinous effluent.  Canister is 2/3 full and will be changed.  Wound GUY:QIHKVQ line Drainage (amount, consistency, odor) Moderate serosanguinous  Moderate clotting along entire length of staple line that was impeding VAC seal.  Area is cleansed with NS.  Barrier strips placed at each end and in the center where there is creasing of the integument.  1 piece black foam along staple line.  Covered with drape and seal immediately achieved at 125 mmHg, .  Change Monday and Thursday Periwound: Cleansed with soap and water.  Had gelatinous clotting and bleeding  Dressing procedure/placement/frequency: Cleanse left BKA site with NS and pat dry. Apply piece of barrier ring at each end of staple line and in the center where there is creasing.  1 piece black foam, cover with drape.  Canister is changed at this time.  Supplies for next dressing change and an additional canister are left in room at bedside.  Will follow  Branda Cain MSN, RN, FNP-BC CWON Wound, Ostomy, Continence Nurse Outpatient [facility] [phone] Pager [phone]

## 2023-12-26 NOTE — Assessment & Plan Note (Signed)
 Glucose controlled - Continue glargine - Continue Ss corrections - Cotninue gabapentin 

## 2023-12-27 ENCOUNTER — Other Ambulatory Visit: Payer: Self-pay

## 2023-12-27 ENCOUNTER — Inpatient Hospital Stay (HOSPITAL_COMMUNITY)
Admission: AD | Admit: 2023-12-27 | Discharge: 2024-01-09 | DRG: 560 | Disposition: A | Discharge status: Home Health | Source: Intra-hospital | Attending: Physical Medicine & Rehabilitation | Admitting: Physical Medicine & Rehabilitation

## 2023-12-27 ENCOUNTER — Other Ambulatory Visit (HOSPITAL_COMMUNITY): Payer: Self-pay

## 2023-12-27 ENCOUNTER — Encounter (HOSPITAL_COMMUNITY): Payer: Self-pay | Admitting: Physical Medicine & Rehabilitation

## 2023-12-27 DIAGNOSIS — M109 Gout, unspecified: Secondary | ICD-10-CM | POA: Diagnosis present

## 2023-12-27 DIAGNOSIS — S82853A Displaced trimalleolar fracture of unspecified lower leg, initial encounter for closed fracture: Secondary | ICD-10-CM | POA: Diagnosis not present

## 2023-12-27 DIAGNOSIS — N179 Acute kidney failure, unspecified: Secondary | ICD-10-CM | POA: Diagnosis not present

## 2023-12-27 DIAGNOSIS — Z8249 Family history of ischemic heart disease and other diseases of the circulatory system: Secondary | ICD-10-CM

## 2023-12-27 DIAGNOSIS — F331 Major depressive disorder, recurrent, moderate: Secondary | ICD-10-CM | POA: Diagnosis not present

## 2023-12-27 DIAGNOSIS — E871 Hypo-osmolality and hyponatremia: Secondary | ICD-10-CM | POA: Diagnosis present

## 2023-12-27 DIAGNOSIS — B9620 Unspecified Escherichia coli [E. coli] as the cause of diseases classified elsewhere: Secondary | ICD-10-CM | POA: Diagnosis present

## 2023-12-27 DIAGNOSIS — Z9841 Cataract extraction status, right eye: Secondary | ICD-10-CM

## 2023-12-27 DIAGNOSIS — R7881 Bacteremia: Secondary | ICD-10-CM | POA: Diagnosis not present

## 2023-12-27 DIAGNOSIS — B9562 Methicillin resistant Staphylococcus aureus infection as the cause of diseases classified elsewhere: Secondary | ICD-10-CM | POA: Diagnosis not present

## 2023-12-27 DIAGNOSIS — J449 Chronic obstructive pulmonary disease, unspecified: Secondary | ICD-10-CM | POA: Diagnosis present

## 2023-12-27 DIAGNOSIS — R339 Retention of urine, unspecified: Secondary | ICD-10-CM | POA: Diagnosis not present

## 2023-12-27 DIAGNOSIS — I252 Old myocardial infarction: Secondary | ICD-10-CM

## 2023-12-27 DIAGNOSIS — G4733 Obstructive sleep apnea (adult) (pediatric): Secondary | ICD-10-CM | POA: Diagnosis not present

## 2023-12-27 DIAGNOSIS — I480 Paroxysmal atrial fibrillation: Secondary | ICD-10-CM | POA: Diagnosis present

## 2023-12-27 DIAGNOSIS — N390 Urinary tract infection, site not specified: Secondary | ICD-10-CM | POA: Diagnosis present

## 2023-12-27 DIAGNOSIS — N184 Chronic kidney disease, stage 4 (severe): Secondary | ICD-10-CM | POA: Diagnosis present

## 2023-12-27 DIAGNOSIS — F419 Anxiety disorder, unspecified: Secondary | ICD-10-CM | POA: Diagnosis present

## 2023-12-27 DIAGNOSIS — E038 Other specified hypothyroidism: Secondary | ICD-10-CM | POA: Diagnosis present

## 2023-12-27 DIAGNOSIS — E1042 Type 1 diabetes mellitus with diabetic polyneuropathy: Secondary | ICD-10-CM | POA: Diagnosis present

## 2023-12-27 DIAGNOSIS — B961 Klebsiella pneumoniae [K. pneumoniae] as the cause of diseases classified elsewhere: Secondary | ICD-10-CM | POA: Diagnosis present

## 2023-12-27 DIAGNOSIS — K5904 Chronic idiopathic constipation: Secondary | ICD-10-CM

## 2023-12-27 DIAGNOSIS — D649 Anemia, unspecified: Secondary | ICD-10-CM | POA: Diagnosis not present

## 2023-12-27 DIAGNOSIS — Z8673 Personal history of transient ischemic attack (TIA), and cerebral infarction without residual deficits: Secondary | ICD-10-CM

## 2023-12-27 DIAGNOSIS — I2510 Atherosclerotic heart disease of native coronary artery without angina pectoris: Secondary | ICD-10-CM | POA: Diagnosis present

## 2023-12-27 DIAGNOSIS — R21 Rash and other nonspecific skin eruption: Secondary | ICD-10-CM | POA: Diagnosis not present

## 2023-12-27 DIAGNOSIS — I130 Hypertensive heart and chronic kidney disease with heart failure and stage 1 through stage 4 chronic kidney disease, or unspecified chronic kidney disease: Secondary | ICD-10-CM | POA: Diagnosis not present

## 2023-12-27 DIAGNOSIS — Z952 Presence of prosthetic heart valve: Secondary | ICD-10-CM | POA: Diagnosis not present

## 2023-12-27 DIAGNOSIS — E1142 Type 2 diabetes mellitus with diabetic polyneuropathy: Secondary | ICD-10-CM | POA: Diagnosis not present

## 2023-12-27 DIAGNOSIS — J9611 Chronic respiratory failure with hypoxia: Secondary | ICD-10-CM | POA: Diagnosis not present

## 2023-12-27 DIAGNOSIS — M25511 Pain in right shoulder: Secondary | ICD-10-CM | POA: Diagnosis not present

## 2023-12-27 DIAGNOSIS — Z89512 Acquired absence of left leg below knee: Secondary | ICD-10-CM | POA: Diagnosis not present

## 2023-12-27 DIAGNOSIS — Z8616 Personal history of COVID-19: Secondary | ICD-10-CM

## 2023-12-27 DIAGNOSIS — Z7982 Long term (current) use of aspirin: Secondary | ICD-10-CM

## 2023-12-27 DIAGNOSIS — S88112A Complete traumatic amputation at level between knee and ankle, left lower leg, initial encounter: Secondary | ICD-10-CM | POA: Diagnosis not present

## 2023-12-27 DIAGNOSIS — F5101 Primary insomnia: Secondary | ICD-10-CM | POA: Diagnosis present

## 2023-12-27 DIAGNOSIS — L03116 Cellulitis of left lower limb: Secondary | ICD-10-CM

## 2023-12-27 DIAGNOSIS — I5032 Chronic diastolic (congestive) heart failure: Secondary | ICD-10-CM

## 2023-12-27 DIAGNOSIS — Z833 Family history of diabetes mellitus: Secondary | ICD-10-CM

## 2023-12-27 DIAGNOSIS — Z794 Long term (current) use of insulin: Secondary | ICD-10-CM | POA: Diagnosis not present

## 2023-12-27 DIAGNOSIS — Z6837 Body mass index (BMI) 37.0-37.9, adult: Secondary | ICD-10-CM

## 2023-12-27 DIAGNOSIS — A419 Sepsis, unspecified organism: Secondary | ICD-10-CM | POA: Diagnosis not present

## 2023-12-27 DIAGNOSIS — R6520 Severe sepsis without septic shock: Secondary | ICD-10-CM | POA: Diagnosis not present

## 2023-12-27 DIAGNOSIS — Z888 Allergy status to other drugs, medicaments and biological substances status: Secondary | ICD-10-CM

## 2023-12-27 DIAGNOSIS — Z7902 Long term (current) use of antithrombotics/antiplatelets: Secondary | ICD-10-CM

## 2023-12-27 DIAGNOSIS — D62 Acute posthemorrhagic anemia: Secondary | ICD-10-CM | POA: Diagnosis present

## 2023-12-27 DIAGNOSIS — G8929 Other chronic pain: Secondary | ICD-10-CM | POA: Diagnosis present

## 2023-12-27 DIAGNOSIS — K219 Gastro-esophageal reflux disease without esophagitis: Secondary | ICD-10-CM | POA: Diagnosis present

## 2023-12-27 DIAGNOSIS — Z955 Presence of coronary angioplasty implant and graft: Secondary | ICD-10-CM

## 2023-12-27 DIAGNOSIS — Z4781 Encounter for orthopedic aftercare following surgical amputation: Principal | ICD-10-CM

## 2023-12-27 DIAGNOSIS — I5042 Chronic combined systolic (congestive) and diastolic (congestive) heart failure: Secondary | ICD-10-CM | POA: Diagnosis present

## 2023-12-27 DIAGNOSIS — M069 Rheumatoid arthritis, unspecified: Secondary | ICD-10-CM | POA: Diagnosis present

## 2023-12-27 DIAGNOSIS — Z79899 Other long term (current) drug therapy: Secondary | ICD-10-CM

## 2023-12-27 DIAGNOSIS — Z9842 Cataract extraction status, left eye: Secondary | ICD-10-CM

## 2023-12-27 DIAGNOSIS — E782 Mixed hyperlipidemia: Secondary | ICD-10-CM | POA: Diagnosis not present

## 2023-12-27 DIAGNOSIS — M545 Low back pain, unspecified: Secondary | ICD-10-CM

## 2023-12-27 DIAGNOSIS — Z83438 Family history of other disorder of lipoprotein metabolism and other lipidemia: Secondary | ICD-10-CM

## 2023-12-27 DIAGNOSIS — F411 Generalized anxiety disorder: Secondary | ICD-10-CM | POA: Diagnosis not present

## 2023-12-27 DIAGNOSIS — Z981 Arthrodesis status: Secondary | ICD-10-CM

## 2023-12-27 DIAGNOSIS — T501X5A Adverse effect of loop [high-ceiling] diuretics, initial encounter: Secondary | ICD-10-CM | POA: Diagnosis present

## 2023-12-27 DIAGNOSIS — M7989 Other specified soft tissue disorders: Secondary | ICD-10-CM | POA: Diagnosis not present

## 2023-12-27 DIAGNOSIS — Z91013 Allergy to seafood: Secondary | ICD-10-CM

## 2023-12-27 DIAGNOSIS — I5043 Acute on chronic combined systolic (congestive) and diastolic (congestive) heart failure: Secondary | ICD-10-CM | POA: Diagnosis not present

## 2023-12-27 DIAGNOSIS — R251 Tremor, unspecified: Secondary | ICD-10-CM | POA: Diagnosis not present

## 2023-12-27 DIAGNOSIS — I509 Heart failure, unspecified: Secondary | ICD-10-CM | POA: Diagnosis not present

## 2023-12-27 DIAGNOSIS — E1169 Type 2 diabetes mellitus with other specified complication: Secondary | ICD-10-CM | POA: Diagnosis not present

## 2023-12-27 DIAGNOSIS — E1022 Type 1 diabetes mellitus with diabetic chronic kidney disease: Secondary | ICD-10-CM | POA: Diagnosis not present

## 2023-12-27 DIAGNOSIS — Z87891 Personal history of nicotine dependence: Secondary | ICD-10-CM

## 2023-12-27 LAB — CBC
HCT: 23.6 % — ABNORMAL LOW (ref 36.0–46.0)
HCT: 26.3 % — ABNORMAL LOW (ref 36.0–46.0)
Hemoglobin: 7.3 g/dL — ABNORMAL LOW (ref 12.0–15.0)
Hemoglobin: 8.1 g/dL — ABNORMAL LOW (ref 12.0–15.0)
MCH: 26.9 pg (ref 26.0–34.0)
MCH: 27.2 pg (ref 26.0–34.0)
MCHC: 30.9 g/dL (ref 30.0–36.0)
MCHC: 30.8 g/dL (ref 30.0–36.0)
MCV: 87.1 fL (ref 80.0–100.0)
MCV: 88.3 fL (ref 80.0–100.0)
Platelets: 275 10*3/uL (ref 150–400)
Platelets: 301 10*3/uL (ref 150–400)
RBC: 2.71 MIL/uL — ABNORMAL LOW (ref 3.87–5.11)
RBC: 2.98 MIL/uL — ABNORMAL LOW (ref 3.87–5.11)
RDW: 17 % — ABNORMAL HIGH (ref 11.5–15.5)
RDW: 16.9 % — ABNORMAL HIGH (ref 11.5–15.5)
WBC: 10.1 10*3/uL (ref 4.0–10.5)
WBC: 11.9 10*3/uL — ABNORMAL HIGH (ref 4.0–10.5)
nRBC: 0 % (ref 0.0–0.2)
nRBC: 0 % (ref 0.0–0.2)

## 2023-12-27 LAB — CREATININE, SERUM
Creatinine, Ser: 2.92 mg/dL — ABNORMAL HIGH (ref 0.44–1.00)
GFR, Estimated: 18 mL/min — ABNORMAL LOW (ref >60)

## 2023-12-27 LAB — GLUCOSE, CAPILLARY
Glucose-Capillary: 124 mg/dL — ABNORMAL HIGH (ref 70–99)
Glucose-Capillary: 107 mg/dL — ABNORMAL HIGH (ref 70–99)
Glucose-Capillary: 105 mg/dL — ABNORMAL HIGH (ref 70–99)
Glucose-Capillary: 146 mg/dL — ABNORMAL HIGH (ref 70–99)

## 2023-12-27 LAB — PREPARE RBC (CROSSMATCH)

## 2023-12-27 LAB — BASIC METABOLIC PANEL WITH GFR
Anion gap: 12 (ref 5–15)
BUN: 57 mg/dL — ABNORMAL HIGH (ref 6–20)
CO2: 26 mmol/L (ref 22–32)
Calcium: 8.5 mg/dL — ABNORMAL LOW (ref 8.9–10.3)
Chloride: 98 mmol/L (ref 98–111)
Creatinine, Ser: 2.71 mg/dL — ABNORMAL HIGH (ref 0.44–1.00)
GFR, Estimated: 19 mL/min — ABNORMAL LOW (ref >60)
Glucose, Bld: 125 mg/dL — ABNORMAL HIGH (ref 70–99)
Potassium: 4.7 mmol/L (ref 3.5–5.1)
Sodium: 136 mmol/L (ref 135–145)

## 2023-12-27 LAB — BPAM RBC: Blood Product Expiration Date: 202506262359

## 2023-12-27 LAB — CK: Total CK: 10 U/L — ABNORMAL LOW (ref 38–234)

## 2023-12-27 MED ORDER — ESCITALOPRAM OXALATE 10 MG PO TABS: 10.0000 mg | ORAL_TABLET | Freq: Every day | ORAL | Status: DC

## 2023-12-27 MED ORDER — BUSPIRONE HCL 15 MG PO TABS
15.0000 mg | ORAL_TABLET | Freq: Three times a day (TID) | ORAL | Status: DC
  Administered 2023-12-27 – 2024-01-09 (×35): 15 mg via ORAL
  Filled 2023-12-27 (×38): qty 1

## 2023-12-27 MED ORDER — DAPTOMYCIN-SODIUM CHLORIDE 700-0.9 MG/100ML-% IV SOLN
700.0000 mg | INTRAVENOUS | Status: AC
  Administered 2023-12-28: 700 mg via INTRAVENOUS
  Filled 2023-12-27: qty 100

## 2023-12-27 MED ORDER — GABAPENTIN 300 MG PO CAPS
300.0000 mg | ORAL_CAPSULE | Freq: Three times a day (TID) | ORAL | Status: DC
  Administered 2023-12-27 – 2024-01-09 (×35): 300 mg via ORAL
  Filled 2023-12-27 (×38): qty 1

## 2023-12-27 MED ORDER — LORAZEPAM 0.5 MG PO TABS
0.5000 mg | ORAL_TABLET | Freq: Four times a day (QID) | ORAL | Status: DC | PRN
  Administered 2023-12-27 – 2024-01-06 (×6): 0.5 mg via ORAL
  Filled 2023-12-27 (×7): qty 1

## 2023-12-27 MED ORDER — DAPTOMYCIN-SODIUM CHLORIDE 700-0.9 MG/100ML-% IV SOLN: 700.0000 mg | INTRAVENOUS | Status: DC

## 2023-12-27 MED ORDER — OXYCODONE HCL 5 MG PO TABS
7.5000 mg | ORAL_TABLET | ORAL | Status: DC | PRN
  Administered 2023-12-27 – 2024-01-09 (×42): 7.5 mg via ORAL
  Filled 2023-12-27 (×45): qty 2

## 2023-12-27 MED ORDER — INSULIN ASPART 100 UNIT/ML IJ SOLN
0.0000 [IU] | Freq: Three times a day (TID) | INTRAMUSCULAR | Status: DC
  Administered 2023-12-28 – 2024-01-07 (×15): 1 [IU] via SUBCUTANEOUS

## 2023-12-27 MED ORDER — ACETAMINOPHEN 650 MG RE SUPP: 650.0000 mg | Freq: Four times a day (QID) | RECTAL | Status: DC | PRN

## 2023-12-27 MED ORDER — CARVEDILOL 6.25 MG PO TABS
9.3750 mg | ORAL_TABLET | Freq: Two times a day (BID) | ORAL | Status: DC
  Administered 2023-12-28 – 2023-12-29 (×4): 9.375 mg via ORAL
  Filled 2023-12-27 (×5): qty 1

## 2023-12-27 MED ORDER — INSULIN GLARGINE-YFGN 100 UNIT/ML ~~LOC~~ SOLN: 5.0000 [IU] | Freq: Every day | SUBCUTANEOUS | Status: DC

## 2023-12-27 MED ORDER — ESCITALOPRAM OXALATE 10 MG PO TABS
10.0000 mg | ORAL_TABLET | Freq: Every day | ORAL | Status: DC
  Administered 2023-12-28 – 2024-01-04 (×7): 10 mg via ORAL
  Filled 2023-12-27 (×8): qty 1

## 2023-12-27 MED ORDER — SODIUM CHLORIDE 0.9% IV SOLUTION: Freq: Once | INTRAVENOUS | Status: AC

## 2023-12-27 MED ORDER — INSULIN ASPART 100 UNIT/ML IJ SOLN: 3.0000 [IU] | Freq: Three times a day (TID) | INTRAMUSCULAR | Status: DC

## 2023-12-27 MED ORDER — CHLORHEXIDINE GLUCONATE CLOTH 2 % EX PADS
6.0000 | MEDICATED_PAD | Freq: Two times a day (BID) | CUTANEOUS | Status: DC
  Administered 2023-12-27 – 2024-01-08 (×9): 6 via TOPICAL

## 2023-12-27 MED ORDER — ALLOPURINOL 100 MG PO TABS
50.0000 mg | ORAL_TABLET | Freq: Every day | ORAL | Status: DC
  Administered 2023-12-28 – 2024-01-09 (×12): 50 mg via ORAL
  Filled 2023-12-27 (×13): qty 1

## 2023-12-27 MED ORDER — INSULIN ASPART 100 UNIT/ML IJ SOLN
3.0000 [IU] | Freq: Three times a day (TID) | INTRAMUSCULAR | Status: DC
  Administered 2023-12-28 – 2024-01-09 (×24): 3 [IU] via SUBCUTANEOUS

## 2023-12-27 MED ORDER — INSULIN GLARGINE-YFGN 100 UNIT/ML ~~LOC~~ SOLN
5.0000 [IU] | Freq: Every day | SUBCUTANEOUS | Status: DC
  Administered 2023-12-28 – 2024-01-09 (×12): 5 [IU] via SUBCUTANEOUS
  Filled 2023-12-27 (×14): qty 0.05

## 2023-12-27 MED ORDER — HEPARIN SODIUM (PORCINE) 5000 UNIT/ML IJ SOLN
5000.0000 [IU] | Freq: Three times a day (TID) | INTRAMUSCULAR | Status: DC
  Administered 2023-12-27 – 2023-12-28 (×2): 5000 [IU] via SUBCUTANEOUS
  Filled 2023-12-27 (×2): qty 1

## 2023-12-27 MED ORDER — LATANOPROST 0.005 % OP SOLN
1.0000 [drp] | Freq: Every day | OPHTHALMIC | Status: DC
  Administered 2023-12-27 – 2024-01-08 (×12): 1 [drp] via OPHTHALMIC

## 2023-12-27 MED ORDER — ACETAMINOPHEN 325 MG PO TABS
650.0000 mg | ORAL_TABLET | Freq: Four times a day (QID) | ORAL | Status: DC | PRN
  Administered 2024-01-04: 650 mg via ORAL
  Filled 2023-12-27: qty 2

## 2023-12-27 MED ORDER — PANTOPRAZOLE SODIUM 40 MG PO TBEC
40.0000 mg | DELAYED_RELEASE_TABLET | Freq: Every day | ORAL | Status: DC
  Administered 2023-12-28 – 2024-01-09 (×12): 40 mg via ORAL
  Filled 2023-12-27 (×13): qty 1

## 2023-12-27 MED ORDER — EZETIMIBE 10 MG PO TABS
10.0000 mg | ORAL_TABLET | Freq: Every day | ORAL | Status: DC
  Administered 2023-12-28 – 2024-01-09 (×12): 10 mg via ORAL
  Filled 2023-12-27 (×13): qty 1

## 2023-12-27 MED ORDER — POLYETHYLENE GLYCOL 3350 17 G PO PACK
17.0000 g | PACK | Freq: Every day | ORAL | Status: DC
Start: 2023-12-27 — End: 2024-01-09
  Administered 2023-12-28 – 2024-01-06 (×6): 17 g via ORAL
  Filled 2023-12-27 (×14): qty 1

## 2023-12-27 MED ORDER — INSULIN ASPART 100 UNIT/ML IJ SOLN: 0.0000 [IU] | Freq: Three times a day (TID) | INTRAMUSCULAR | Status: DC

## 2023-12-27 MED ORDER — TICAGRELOR 90 MG PO TABS
90.0000 mg | ORAL_TABLET | Freq: Two times a day (BID) | ORAL | Status: DC
  Administered 2023-12-27 – 2024-01-09 (×23): 90 mg via ORAL
  Filled 2023-12-27 (×26): qty 1

## 2023-12-27 MED ORDER — ASPIRIN 81 MG PO TBEC
81.0000 mg | DELAYED_RELEASE_TABLET | Freq: Every day | ORAL | Status: DC
  Administered 2023-12-28 – 2024-01-09 (×12): 81 mg via ORAL
  Filled 2023-12-27 (×13): qty 1

## 2023-12-27 MED ORDER — OXYCODONE HCL 7.5 MG PO TABS: 7.5000 mg | ORAL_TABLET | ORAL | Status: DC | PRN

## 2023-12-27 MED ORDER — HEPARIN SODIUM (PORCINE) 5000 UNIT/ML IJ SOLN: 5000.0000 [IU] | Freq: Three times a day (TID) | INTRAMUSCULAR | Status: DC

## 2023-12-27 MED ORDER — POLYETHYLENE GLYCOL 3350 17 GM/SCOOP PO POWD
17.0000 g | Freq: Every day | ORAL | 0 refills | Status: DC
  Filled 2023-12-27: qty 238, 14d supply, fill #0

## 2023-12-27 MED ORDER — SENNA 8.6 MG PO TABS
2.0000 | ORAL_TABLET | Freq: Every day | ORAL | Status: DC
Start: 2023-12-27 — End: 2024-01-09
  Administered 2023-12-27 – 2024-01-05 (×5): 17.2 mg via ORAL
  Filled 2023-12-27 (×12): qty 2

## 2023-12-27 NOTE — H&P (Signed)
 Physical Medicine and Rehabilitation Admission H&P       HPI: Dana [name] is a 63 year old right-handed female with history significant for hyperlipidemia, rheumatoid arthritis, CVA, insulin -dependent diabetes mellitus, CKD stage IV, CAD with multiple stents and angioplasty 07/18/2023 as well as history of mitral valve repair, maintained on low-dose aspirin  as well as Brilinta , chronic hypoxic respiratory failure, OSA on CPAP and quit smoking 41 years ago, chronic diastolic congestive heart failure, depression/anxiety, chronic pain, left trimalleolar ankle fracture/dislocation status post repair on 11/13/2023.  Per chart review patient lives with parent.  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  She is able to live on the main level of the home with bedroom and bathroom.  There is a ramped entrance.  Presented 12/14/2023 from [facility] after presenting to the ED 5/29 for fevers, dysuria and flank pain.  She was started on IV fluids and broad-spectrum antibiotics for suspected UTI with CT abdomen pelvis showing bladder wall thickening on CT as well as  noted to have purulent drainage from left heel wound where cultures grew MRSA.  She was transferred to [facility].  Blood culture showed no growth, lactic acid within normal limits, sodium 134, glucose 137, BUN 50, creatinine 2.25, hemoglobin 7.2, WBC 19,600.  Infectious disease consulted placed on vancomycin .  Chest x-ray showed prominent cardiac silhouette and vascular congestion.  CT renal stone study showed no hydronephrosis.  Enlarged left ovary with surrounding inflammatory stranding and fluid.  Transabdominal ultrasound of the pelvis showed nonvisualization of the ovaries bilaterally.  Trace simple appearing free fluid within the right adnexa, nonspecific.  Nuclear medicine perfusion lung scan no evidence of acute pulmonary emboli.  Venous Doppler studies lower extremities  negative for DVT.  Dr. Julio Ohm follow-up for left heel wound and x-ray showed postoperative changes disrupted ankle mortise soft tissue swelling.  Limb was not felt to be salvageable preoperative cardiac clearance obtained and underwent left BKA 12/18/2023 per Dr. Julio Ohm with wound VAC applied and orders to CONTINUE WOUND VAC WITH PREVENA PLUS PUMP AT DISCHARGE FOR 1 WEEK.Aaron Aas  Hospital course acute on chronic anemia latest hemoglobin 8.8.  Maintained on daptomycin  x 5 days postoperatively.  Leukocytosis improved to 12,600.  Renal function remained stable with latest creatinine 2.31.  Patient was cleared for subcutaneous heparin  for DVT prophylaxis.  Her aspirin  and Brilinta  were resumed postoperatively.  Patient did receive psychiatry consult during hospitalization for depression remained on Ativan  as needed as well as Lexapro /BuSpar .  Therapy evaluations completed due to patient decreased functional mobility was admitted for a comprehensive rehab program.   Review of Systems  Constitutional:  Positive for chills and fever.  HENT:  Negative for hearing loss.   Eyes:  Negative for blurred vision and double vision.  Respiratory:  Negative for cough, shortness of breath and wheezing.   Cardiovascular:  Positive for leg swelling. Negative for chest pain and palpitations.  Gastrointestinal:  Positive for constipation. Negative for heartburn, nausea and vomiting.  Genitourinary:  Positive for urgency. Negative for dysuria, flank pain and hematuria.       Stress urinary incontinence  Musculoskeletal:  Positive for back pain, joint pain and myalgias.  Skin:  Negative for rash.  Neurological:  Positive for tingling and weakness.  Psychiatric/Behavioral:  Positive for depression. The patient has insomnia.        Anxiety  All other systems reviewed and are negative.       Past Medical History:  Diagnosis Date   Acquired absence of left great toe (HCC)     Acquired absence of other left toe(s) (HCC)     ACS (acute  coronary syndrome) (HCC)     Acute bronchitis due to COVID-19 virus 03/03/2023   Acute on chronic combined systolic and diastolic CHF (congestive heart failure) (HCC) 09/16/2023   Acute respiratory failure with hypoxia (HCC) 07/20/2023   Anemia     Anoxic brain injury (HCC)     Anxiety     CAD (coronary artery disease)      DES mLAD 04/07/15; DES dRCA & DES pPDA 08/30/16; DES mCX & PTCA OM1 09/29/20   Charcot's joint of foot in type 2 diabetes mellitus (HCC) 12/15/2023   Chronic diastolic (congestive) heart failure (HCC)     Chronic pain     Chronic systolic (congestive) heart failure (HCC)     CKD (chronic kidney disease)     Contrast dye induced nephropathy, possible 09/03/2020   COPD (chronic obstructive pulmonary disease) (HCC)     COVID-19 virus infection 03/03/2023   Demand ischemia (HCC)     Diabetes (HCC)type 1     Diastolic CHF (HCC)     Dyspnea     Encounter for screening mammogram for malignant neoplasm of breast 09/16/2023   Family history of breast cancer 10/23/2021   Gastro-esophageal reflux disease without esophagitis     Hyperlipidemia     Hypertension     Hypertensive heart disease with heart failure (HCC)     Hypoxemia     Idiopathic gout, multiple sites     Leukocytosis 03/29/2022   Localized edema     Low back pain     Mitral regurgitation     Mitral regurgitation     Mixed hyperlipidemia     Mixed incontinence     Morbid (severe) obesity due to excess calories (HCC)     Neuromuscular disorder (HCC)     Neuropathy     Non-ST elevation (NSTEMI) myocardial infarction (HCC)      08/29/20, 09/29/20   OSA (obstructive sleep apnea) 09/03/2020   Other specified hypothyroidism     PAF (paroxysmal atrial fibrillation) (HCC)      s/p pulmonary vein isolation by cryoablation 10/04/15 Dr. Jearldine Mina in [facility]   PEA (Pulseless electrical activity) [facility])      ~ 01/13/21 at [facility] during admission DKA, volume overload, possible CAP, hypoxia s/p ACLS with ROSC ~  10-15   Pneumonia     Primary insomnia     Rheumatoid arthritis (HCC)     Stroke (HCC)     Thyroid  disease     Type 1 diabetes mellitus (HCC)     URI with cough and congestion 08/22/2023   Vitamin D  deficiency               Past Surgical History:  Procedure Laterality Date   ABDOMINAL HYSTERECTOMY        Partial- Still has both ovaries   AMPUTATION Left 12/18/2023    Procedure: AMPUTATION BELOW KNEE;  Surgeon: Timothy Ford, MD;  Location: [facility] OR;  Service: Orthopedics;  Laterality: Left;   ANKLE FUSION Left 11/13/2023    Procedure: HINDFOOT FUSION FIXATION OF ANKLE;  Surgeon: Laneta Pintos, MD;  Location: [facility] OR;  Service: Orthopedics;  Laterality: Left;   CARDIOVASCULAR STRESS TEST   08/13/2014    Nuclear; Normal   CARPAL TUNNEL RELEASE       CATARACT EXTRACTION, BILATERAL  CESAREAN SECTION       CHOLECYSTECTOMY       CORONARY ANGIOPLASTY WITH STENT PLACEMENT        3 blockages/ 1 stent   CORONARY BALLOON ANGIOPLASTY N/A 07/18/2023    Procedure: CORONARY BALLOON ANGIOPLASTY;  Surgeon: Odie Benne, MD;  Location: [facility] INVASIVE CV LAB;  Service: Cardiovascular;  Laterality: N/A;   CORONARY PRESSURE/FFR STUDY N/A 09/07/2022    Procedure: INTRAVASCULAR PRESSURE WIRE/FFR STUDY;  Surgeon: Swaziland, Peter M, MD;  Location: [facility] INVASIVE CV LAB;  Service: Cardiovascular;  Laterality: N/A;   CORONARY STENT INTERVENTION N/A 09/29/2020    Procedure: CORONARY STENT INTERVENTION;  Surgeon: Arnoldo Lapping, MD;  Location: [facility] INVASIVE CV LAB;  Service: Cardiovascular;  Laterality: N/A;  CFX   CORONARY STENT INTERVENTION N/A 09/07/2022    Procedure: CORONARY STENT INTERVENTION;  Surgeon: Swaziland, Peter M, MD;  Location: [facility] INVASIVE CV LAB;  Service: Cardiovascular;  Laterality: N/A;   CORONARY STENT INTERVENTION N/A 02/13/2023    Procedure: CORONARY STENT INTERVENTION;  Surgeon: Odie Benne, MD;  Location: [facility] INVASIVE CV LAB;  Service: Cardiovascular;  Laterality: N/A;   LAD   CORONARY ULTRASOUND/IVUS N/A 02/13/2023    Procedure: Coronary Ultrasound/IVUS;  Surgeon: Odie Benne, MD;  Location: [facility] INVASIVE CV LAB;  Service: Cardiovascular;  Laterality: N/A;   CORONARY/GRAFT ACUTE MI REVASCULARIZATION N/A 12/08/2022    Procedure: Coronary/Graft Acute MI Revascularization;  Surgeon: Arleen Lacer, MD;  Location: [facility] INVASIVE CV LAB;  Service: Cardiovascular;  Laterality: N/A;   EYE SURGERY       LEFT HEART CATH AND CORONARY ANGIOGRAPHY N/A 09/29/2020    Procedure: LEFT HEART CATH AND CORONARY ANGIOGRAPHY;  Surgeon: Arnoldo Lapping, MD;  Location: [facility] INVASIVE CV LAB;  Service: Cardiovascular;  Laterality: N/A;   LEFT HEART CATH AND CORONARY ANGIOGRAPHY N/A 09/07/2022    Procedure: LEFT HEART CATH AND CORONARY ANGIOGRAPHY;  Surgeon: Swaziland, Peter M, MD;  Location: [facility] INVASIVE CV LAB;  Service: Cardiovascular;  Laterality: N/A;   LEFT HEART CATH AND CORONARY ANGIOGRAPHY N/A 12/08/2022    Procedure: LEFT HEART CATH AND CORONARY ANGIOGRAPHY;  Surgeon: Arleen Lacer, MD;  Location: [facility] INVASIVE CV LAB;  Service: Cardiovascular;  Laterality: N/A;   LEFT HEART CATH AND CORONARY ANGIOGRAPHY N/A 02/13/2023    Procedure: LEFT HEART CATH AND CORONARY ANGIOGRAPHY;  Surgeon: Odie Benne, MD;  Location: [facility] INVASIVE CV LAB;  Service: Cardiovascular;  Laterality: N/A;   LEFT HEART CATH AND CORONARY ANGIOGRAPHY N/A 07/16/2023    Procedure: LEFT HEART CATH AND CORONARY ANGIOGRAPHY;  Surgeon: Odie Benne, MD;  Location: [facility] INVASIVE CV LAB;  Service: Cardiovascular;  Laterality: N/A;   RIGHT HEART CATH N/A 04/27/2021    Procedure: RIGHT HEART CATH;  Surgeon: Mardell Shade, MD;  Location: [facility] INVASIVE CV LAB;  Service: Cardiovascular;  Laterality: N/A;   RIGHT/LEFT HEART CATH AND CORONARY ANGIOGRAPHY N/A 01/07/2017    Procedure: Right/Left Heart Cath and Coronary Angiography;  Surgeon: Darlis Eisenmenger, MD;  Location: [facility] INVASIVE CV LAB;   Service: Cardiovascular;  Laterality: N/A;   ROTATOR CUFF REPAIR       TEE WITHOUT CARDIOVERSION N/A 04/28/2021    Procedure: TRANSESOPHAGEAL ECHOCARDIOGRAM (TEE);  Surgeon: Mardell Shade, MD;  Location: [facility] ENDOSCOPY;  Service: Cardiovascular;  Laterality: N/A;   TEE WITHOUT CARDIOVERSION N/A 05/18/2021    Procedure: TRANSESOPHAGEAL ECHOCARDIOGRAM (TEE);  Surgeon: Thukkani, Arun K, MD;  Location: [facility] INVASIVE CV LAB;  Service: Cardiovascular;  Laterality: N/A;  TOE AMPUTATION Left     TRANSCATHETER MITRAL EDGE TO EDGE REPAIR N/A 05/18/2021    Procedure: MITRAL VALVE REPAIR;  Surgeon: Thukkani, Arun K, MD;  Location: [facility] INVASIVE CV LAB;  Service: Cardiovascular;  Laterality: N/A;   TRANSESOPHAGEAL ECHOCARDIOGRAM (CATH LAB) N/A 12/23/2023    Procedure: TRANSESOPHAGEAL ECHOCARDIOGRAM;  Surgeon: Wendie Hamburg, MD;  Location: [facility] INVASIVE CV LAB;  Service: Cardiovascular;  Laterality: N/A;   US  ECHOCARDIOGRAPHY   05/2017    Normal             Family History  Problem Relation Age of Onset   Breast cancer Mother 78        contralateral breast ca dx 81   Coronary artery disease Mother     Hypertension Mother     Hyperlipidemia Mother     Diabetes type II Mother     Lung cancer Mother 37   Diabetes Father     Hypertension Father     Mental illness Sister     Bipolar disorder Other     Depression Other     Schizophrenia Other     Cancer Other          MGF's sisters x2; unknown type        Social History:  reports that she quit smoking about 41 years ago. Her smoking use included cigarettes. She started smoking about 42 years ago. She has been exposed to tobacco smoke. She has never used smokeless tobacco. She reports that she does not drink alcohol and does not use drugs. Allergies:  Allergies       Allergies  Allergen Reactions   Naproxen Hives and Rash   Shellfish-Derived Products Hives, Swelling and Other (See Comments)      Angioedema     Strawberry (Diagnostic)  Hives   Celecoxib Other (See Comments)      Stomach pain   Doxepin  Hcl Other (See Comments)      Lethargic, sleeping a lot per pt and family   Glucosamine Hives, Swelling and Other (See Comments)      Angioedema     Iron  Hives and Other (See Comments)      IV iron  transfusion - hives - Feb 2022 hospitalization   Patient said she CAN tolerate if given Benadryl  first   Metoclopramide Other (See Comments)      Facial twitching and stuttering   Metolazone  Other (See Comments)      Acute renal failure   Statins Other (See Comments)      Muscle pain            Medications Prior to Admission  Medication Sig Dispense Refill   acetaminophen  (TYLENOL ) 500 MG tablet Take 1,000 mg by mouth every 6 (six) hours as needed for mild pain (pain score 1-3) or headache.       allopurinol  (ZYLOPRIM ) 100 MG tablet Take 0.5 tablets (50 mg total) by mouth daily. 90 tablet 1   aspirin  EC 81 MG tablet Take 1 tablet (81 mg total) by mouth daily. Swallow whole. 30 tablet 12   busPIRone  (BUSPAR ) 15 MG tablet Take 15 mg by mouth 3 (three) times daily.       Continuous Glucose Sensor (FREESTYLE LIBRE 2 SENSOR) MISC Inject 1 device into skin every 14 (fourteen) days. 2 each 2   EPINEPHrine  0.3 mg/0.3 mL IJ SOAJ injection Use as directed for life-threatening allergic reaction. 4 each 3   Evolocumab  (REPATHA  SURECLICK) 140 MG/ML SOAJ Inject 140 mg into the  skin every 14 (fourteen) days. 6 mL 1   ezetimibe  (ZETIA ) 10 MG tablet Take 1 tablet (10 mg total) by mouth every evening. 90 tablet 1   gabapentin  (NEURONTIN ) 300 MG capsule Take 1 capsule (300 mg total) by mouth 3 (three) times daily. 90 capsule 1   Glucagon  (GVOKE HYPOPEN  2-PACK) 0.5 MG/0.1ML SOAJ Inject 0.5 mg into the skin daily as needed. (Patient taking differently: Inject 0.5 mg into the skin daily as needed (hypoglycemia).) 0.2 mL 5   HYDROcodone -acetaminophen  (NORCO) 7.5-325 MG tablet Take 1 tablet by mouth 3 (three) times daily as needed. 90 tablet 0    insulin  aspart (NOVOLOG  FLEXPEN) 100 UNIT/ML FlexPen Inject 2 - 10 Units before meals based on SSI. 15 mL 3   insulin  glargine (LANTUS  SOLOSTAR) 100 UNIT/ML Solostar Pen Inject 45 Units into the skin daily. (Patient taking differently: Inject 30 Units into the skin daily.)       Insulin  Pen Needle (BD PEN NEEDLE NANO 2ND GEN) 32G X 4 MM MISC Inject 1 each into the skin in the morning, at noon, in the evening, and at bedtime. 600 each 3   latanoprost  (XALATAN ) 0.005 % ophthalmic solution Place 1 drop into both eyes at bedtime. 2.5 mL 0   linaclotide  (LINZESS ) 145 MCG CAPS capsule Take 1 capsule (145 mcg total) by mouth daily before breakfast. (Patient taking differently: Take 145 mcg by mouth as needed (constipation).) 90 capsule 1   LORazepam  (ATIVAN ) 0.5 MG tablet Take 1 tablet (0.5 mg total) by mouth daily as needed for anxiety. 90 tablet 1   magnesium  oxide (MAG-OX) 400 MG tablet Take 2 tablets (800 mg total) by mouth daily. Take 2 (400 mg) daily=800 mg 180 tablet 1   Menthol -Methyl Salicylate  (ICY HOT EXTRA STRENGTH) 10-30 % CREA Apply 1 Application topically as needed (bilateral hips and knees). 85 g 1   nitroGLYCERIN  (NITROSTAT ) 0.4 MG SL tablet DISSOLVE ONE TABLET UNDER THE TONGUE EVERY 5 MINUTES AS NEEDED FOR CHEST PAIN.  DO NOT EXCEED A TOTAL OF 3 DOSES IN 15 MINUTES 50 tablet 3   Omega-3 Fatty Acids (FISH OIL PO) Take 1 capsule by mouth daily.       OXYGEN  Inhale 2 L/min into the lungs at bedtime.       pantoprazole  (PROTONIX ) 40 MG tablet Take 1 tablet (40 mg total) by mouth daily. 90 tablet 1   potassium chloride  SA (KLOR-CON  M) 20 MEQ tablet Take 1 tablet (20 mEq total) by mouth daily.       Probiotic CAPS Take 1 capsule by mouth daily.       promethazine  (PHENERGAN ) 25 MG tablet Take 1 tablet (25 mg total) by mouth daily as needed. 90 tablet 1   ticagrelor  (BRILINTA ) 90 MG TABS tablet Take 1 tablet (90 mg total) by mouth 2 (two) times daily. 180 tablet 1   torsemide  (DEMADEX ) 20 MG  tablet Take 3 tablets (60 mg total) by mouth daily. 90 tablet 3   vitamin D3 (CHOLECALCIFEROL ) 25 MCG tablet Take 2 tablets (2,000 Units total) by mouth 2 (two) times daily. 120 tablet 2   carvedilol  (COREG ) 6.25 MG tablet Take 1.5 tablets (9.375 mg total) by mouth 2 (two) times daily. 90 tablet 3   doxepin  (SINEQUAN ) 150 MG capsule Take 1 capsule (150 mg total) by mouth at bedtime. (Patient not taking: Reported on 12/14/2023) 30 capsule 3   isosorbide  mononitrate (IMDUR ) 30 MG 24 hr tablet Take 1 tablet (30 mg total) by mouth daily.  30 tablet 5   methocarbamol  (ROBAXIN ) 500 MG tablet Take 1 tablet (500 mg total) by mouth every 6 (six) hours as needed for muscle spasms. (Patient not taking: Reported on 12/14/2023) 28 tablet 0   orphenadrine  (NORFLEX ) 100 MG tablet Take 1 tablet (100 mg total) by mouth 2 (two) times daily. (Patient not taking: Reported on 12/14/2023) 60 tablet 0              Home: Home Living Family/patient expects to be discharged to:: Private residence Living Arrangements: Spouse/significant other, Parent Available Help at Discharge: Family, Available 24 hours/day Type of Home: House Home Access: Ramped entrance Home Layout: Able to live on main level with bedroom/bathroom Bathroom Shower/Tub: Health visitor: Standard Home Equipment: Agricultural consultant (2 wheels), Shower seat, Grab bars - tub/shower, Hand held shower head, Cane - single point, Wheelchair - manual, BSC/3in1, Rollator (4 wheels)   Functional History: Prior Function Prior Level of Function : Needs assist, History of Falls (last six months) Mobility Comments: Transfers to [facility], onto knee scooter. Not using W/c in house ADLs Comments: has been using BSC since ankle injury, needing assist for LB dressing since ankle injury but typically manages ADls and light IADLs   Functional Status:  Mobility: Bed Mobility Overal bed mobility: Needs Assistance Bed Mobility: Sit to Supine Supine to sit:  Supervision Sit to supine: Contact guard assist General bed mobility comments: pt able to get to EoB with assist for management of lines and leads Transfers Overall transfer level: Needs assistance Equipment used: Rolling walker (2 wheels), 2 person hand held assist Transfers: Bed to chair/wheelchair/BSC Sit to Stand: Min assist, Mod assist, +2 physical assistance Bed to/from chair/wheelchair/BSC transfer type:: Squat pivot Stand pivot transfers: Total assist, +2 physical assistance, Max assist Squat pivot transfers: Mod assist  Lateral/Scoot Transfers: Min assist General transfer comment: worked on power up to [employer],  provided min-A to [employer], pt unsure about use of RW requiring increased cuing for positioning, worked on finding new CoG in [employer], completed x4, on 4th time attempted pivot to chair unable to off weight R foot enought to pivot on foot, 5th time placed washcloth under R foot, and provided 2xHHA Ambulation/Gait Pre-gait activities: pt worked on upright posture, w/shifting and transfer technque   ADL: ADL Overall ADL's : Needs assistance/impaired Eating/Feeding: Independent Grooming: Set up, Sitting Upper Body Bathing: Set up, Sitting Lower Body Bathing: Maximal assistance, +2 for safety/equipment, +2 for physical assistance, Sit to/from stand Upper Body Dressing : Set up, Sitting Upper Body Dressing Details (indicate cue type and reason): gown to cover back Lower Body Dressing: Maximal assistance, +2 for physical assistance, +2 for safety/equipment, Sit to/from stand Lower Body Dressing Details (indicate cue type and reason): able to donn right shoe sitting on EOB Toilet Transfer: Moderate assistance, Doctor, hospital Details (indicate cue type and reason): simulated to recliner Toileting- Clothing Manipulation and Hygiene: Total assistance, Bed level   Cognition: Cognition Orientation Level: Oriented X4 Cognition Arousal: Alert Behavior During Therapy: WFL for  tasks assessed/performed   Physical Exam: Blood pressure (!) 117/47, pulse 62, temperature 98.1 F (36.7 C), resp. rate 18, height 5' 5 (1.651 m), weight 98 kg, SpO2 95%. Physical Exam Constitutional:      General: She is not in acute distress.    Appearance: She is obese. She is not ill-appearing.  HENT:     Head: Normocephalic and atraumatic.     Right Ear: External ear normal.     Left Ear: External  ear normal.     Nose: Nose normal.     Mouth/Throat:     Mouth: Mucous membranes are moist.    Eyes:     Extraocular Movements: Extraocular movements intact.     Pupils: Pupils are equal, round, and reactive to light.      Cardiovascular:     Rate and Rhythm: Normal rate and regular rhythm.     Heart sounds: No murmur heard.    No gallop.  Pulmonary:     Effort: Pulmonary effort is normal. No respiratory distress.     Breath sounds: No wheezing, rhonchi or rales.  Abdominal:     General: There is no distension.     Palpations: Abdomen is soft.    Musculoskeletal:        General: Swelling: left leg in black shrinker with vac. Normal range of motion.     Cervical back: Normal range of motion.     Right lower leg: Edema present.    Skin:    General: Skin is warm and dry.     Coloration: Skin is not jaundiced.     Comments: Wound sealed with vac, no drainage in cannister     Neurological:     Mental Status: She is alert.     Comments: Alert and oriented x 3. Normal insight and awareness. Intact Memory. Normal language and speech. Cranial nerve exam unremarkable. MMT: BUE 5/5 prox to distal. LLE: HF 3+/5, KE 3/5. RLE 4/5 HF, KE and 5/5 ADF/PF.  Sensory exam normal for light touch and pain in all 4 limbs. No limb ataxia or cerebellar signs. No abnormal tone appreciated.    Psychiatric:        Mood and Affect: Mood normal.        Behavior: Behavior normal.        Lab Results Last 48 Hours        Results for orders placed or performed during the hospital encounter of  12/14/23 (from the past 48 hours)  Glucose, capillary     Status: Abnormal    Collection Time: 12/25/23  7:41 AM  Result Value Ref Range    Glucose-Capillary 145 (H) 70 - 99 mg/dL      Comment: Glucose reference range applies only to samples taken after fasting for at least 8 hours.  Glucose, capillary     Status: Abnormal    Collection Time: 12/25/23 12:09 PM  Result Value Ref Range    Glucose-Capillary 142 (H) 70 - 99 mg/dL      Comment: Glucose reference range applies only to samples taken after fasting for at least 8 hours.  Glucose, capillary     Status: Abnormal    Collection Time: 12/25/23  4:18 PM  Result Value Ref Range    Glucose-Capillary 117 (H) 70 - 99 mg/dL      Comment: Glucose reference range applies only to samples taken after fasting for at least 8 hours.  Glucose, capillary     Status: Abnormal    Collection Time: 12/25/23  6:11 PM  Result Value Ref Range    Glucose-Capillary 133 (H) 70 - 99 mg/dL      Comment: Glucose reference range applies only to samples taken after fasting for at least 8 hours.  Glucose, capillary     Status: Abnormal    Collection Time: 12/25/23  9:59 PM  Result Value Ref Range    Glucose-Capillary 132 (H) 70 - 99 mg/dL      Comment: Glucose  reference range applies only to samples taken after fasting for at least 8 hours.  Glucose, capillary     Status: Abnormal    Collection Time: 12/26/23  5:54 AM  Result Value Ref Range    Glucose-Capillary 156 (H) 70 - 99 mg/dL      Comment: Glucose reference range applies only to samples taken after fasting for at least 8 hours.  Glucose, capillary     Status: Abnormal    Collection Time: 12/26/23 12:05 PM  Result Value Ref Range    Glucose-Capillary 112 (H) 70 - 99 mg/dL      Comment: Glucose reference range applies only to samples taken after fasting for at least 8 hours.  Glucose, capillary     Status: Abnormal    Collection Time: 12/26/23  4:20 PM  Result Value Ref Range    Glucose-Capillary  116 (H) 70 - 99 mg/dL      Comment: Glucose reference range applies only to samples taken after fasting for at least 8 hours.  Glucose, capillary     Status: Abnormal    Collection Time: 12/26/23 10:12 PM  Result Value Ref Range    Glucose-Capillary 112 (H) 70 - 99 mg/dL      Comment: Glucose reference range applies only to samples taken after fasting for at least 8 hours.    *Note: Due to a large number of results and/or encounters for the requested time period, some results have not been displayed. A complete set of results can be found in Results Review.      Imaging Results (Last 48 hours)  No results found.         Blood pressure (!) 117/47, pulse 62, temperature 98.1 F (36.7 C), resp. rate 18, height 5' 5 (1.651 m), weight 98 kg, SpO2 95%.   Medical Problem List and Plan: 1. 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                -patient may shower if leg is covered, vac pump away from water             -ELOS/Goals: 7-10 days, mod I w/c level, supervision ambulatory level 2.  Antithrombotics: -DVT/anticoagulation:  Pharmaceutical: Heparin              -antiplatelet therapy: Aspirin  81 mg daily, Brilinta  90 mg twice daily 3. Pain Management: Neurontin  300 mg 3 times daily, oxycodone  as needed             -pt reports her worst pain is from her low back and related left lumbar radiculopathy. No phantom pain currently                         -continue above meds. Encouraged oob to recliner/wc as much as she can.                          -therapy can work on ROM, posture, etc.  4. Mood/Behavior/Sleep: Lexapro  10 mg daily, BuSpar  15 mg 3 times daily             -mood is positive             -antipsychotic agents: N/A 5. Neuropsych/cognition: This patient is capable of making decisions on her own behalf. 6.  Wound Care/ID:  daptomycin  through 12/28/23 for MRSA bacteremia             -MRSA  contact precautions             -pt was  changed to traditional vac dressing  6/12 as prevena was not holding suction. Currently no drainage in cannister. Dressing is due for a change Monday. Consider removing Vac at that time if no further drainage in cannister.  7. Fluids/Electrolytes/Nutrition: Routine in and outs with follow-up chemistries 8.  Acute on chronic anemia.   Hgb with drop today to 7.3. minimal sx             -transfuse 1u PRBC today             -recheck in AM             -continue Brilinta  for now 9.  Diabetes mellitus with peripheral neuropathy.  Hemoglobin A1c 10.2.  NovoLog  3 units 3 times daily, Semglee  5 units daily.  Check blood sugars AC and at bedtime 10.  CAD/stenting/angioplasty as well as history of mitral valve replacement.  Continue aspirin  and Brilinta .  Follow-up cardiology services 11.  Diastolic congestive heart failure.  Coreg  9.375 mg twice daily, Demadex  60 mg daily (hold).  Monitor for any signs of fluid overload             -weigh daily 12.  GERD.  Protonix  13.  Hyperlipidemia.  Zetia  14.  Chronic hypoxic respiratory failure/OSA.  Continue CPAP 15.  CKD stage IV.  Baseline 2.2-2.4.               -Cr bumped today to 2.7             -may be related to anemia             -hold demadex  in am, pending labs 16.  History of gout.  Allopurinol .  Monitor for any gout flares     Sterling Eisenmenger, PA-C 12/27/2023  I have personally performed a face to face diagnostic evaluation of this patient and formulated the key components of the plan.  Additionally, I have personally reviewed laboratory data, imaging studies, as well as relevant notes and concur with the physician assistant's documentation above.  The patient's status has not changed from the original H&P.  Any changes in documentation from the acute care chart have been noted above.  Rawland Caddy, MD, [facility]

## 2023-12-27 NOTE — Progress Notes (Signed)
 Occupational Therapy Treatment Patient Details Name: Dana [name] MRN: [id number] DOB: [birth year] Today's Date: 12/27/2023   History of present illness 63 y.o. female admitted  5/31 with Severe sepsis, present on admission  MRSA bacteremia,  Left ankle post op infection,  Bilateral pyelonephritis. 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 PMH insulin -dependent diabetes mellitus, CKD stage IV, CAD, chronic hypoxic respiratory failure, OSA on CPAP, chronic HFmrEF, depression, anxiety, chronic pain, and left trimalleolar ankle fracture/dislocation status postoperative repair on 11/13/2023   OT comments  Patient seated on EOB completing breakfast upon entry. Patient able to donn shoe on RLE and gown on back before performing lateral scoot transfer to recliner. Patient able to perform 75% of lateral scoot transfer with supervision and min assist to complete. Patient able to perform grooming tasks seated in recliner with setup and able to perform UE HEP following printed exercises with green therapy band. Patient will benefit from intensive inpatient follow-up therapy, >3 hours/day. Acute OT to continue to follow to address established goals to facilitate DC to next venue of care.        If plan is discharge home, recommend the following:  A lot of help with walking and/or transfers;Two people to help with walking and/or transfers;A lot of help with bathing/dressing/bathroom;Two people to help with bathing/dressing/bathroom   Equipment Recommendations  Wheelchair (measurements OT);Wheelchair cushion (measurements OT);Other (comment) (drop arm BSC)    Recommendations for Other Services      Precautions / Restrictions Precautions Precautions: Fall Recall of Precautions/Restrictions: Intact Precaution/Restrictions Comments: LLE would vac, L limb protector Restrictions Weight Bearing Restrictions Per Provider Order: Yes LLE Weight Bearing Per Provider Order: Non weight bearing       Mobility Bed Mobility Overal bed  mobility: Needs Assistance             General bed mobility comments: seated on EOB upon entry    Transfers Overall transfer level: Needs assistance Equipment used: None Transfers: Bed to chair/wheelchair/BSC            Lateral/Scoot Transfers: Min assist General transfer comment: patient able to perform 75% of lateral scoot transfer to recliner before requiring min assist to complete     Balance Overall balance assessment: Needs assistance Sitting-balance support: No upper extremity supported, Feet supported Sitting balance-Leahy Scale: Good Sitting balance - Comments: seated on EOB completing breakfast upon entry                                   ADL either performed or assessed with clinical judgement   ADL Overall ADL's : Needs assistance/impaired     Grooming: Wash/dry hands;Wash/dry face;Oral care;Set up;Sitting           Upper Body Dressing : Set up;Sitting Upper Body Dressing Details (indicate cue type and reason): gown to cover back Lower Body Dressing: Maximal assistance;+2 for physical assistance;+2 for safety/equipment;Sit to/from stand Lower Body Dressing Details (indicate cue type and reason): able to donn right shoe sitting on EOB               General ADL Comments: patient able to donn shoe while seated on EOB patient would require more assistance for donning pants due to limitations when standing    Extremity/Trunk Assessment              Vision       Perception     Praxis     Communication  Cognition Arousal: Alert Behavior During Therapy: WFL for tasks assessed/performed Cognition: No apparent impairments                               Following commands: Intact        Cueing      Exercises Exercises: General Upper Extremity General Exercises - Upper Extremity Shoulder Flexion: Strengthening, Both, 15 reps, Seated, Theraband Theraband Level (Shoulder Flexion): Level 3 (Green) Shoulder  Horizontal ABduction: Strengthening, Both, 15 reps, Seated, Theraband Theraband Level (Shoulder Horizontal Abduction): Level 3 (Green) Elbow Flexion: Strengthening, Both, 15 reps, Seated, Theraband Theraband Level (Elbow Flexion): Level 3 (Green) Elbow Extension: Strengthening, Both, 15 reps, Seated, Theraband Theraband Level (Elbow Extension): Level 3 (Green)    Shoulder Instructions       General Comments VSS on RA    Pertinent Vitals/ Pain       Pain Assessment Pain Assessment: Faces Faces Pain Scale: Hurts even more Pain Location: LLE with bed mobility Pain Descriptors / Indicators: Grimacing Pain Intervention(s): Limited activity within patient's tolerance, Monitored during session, Patient requesting pain meds-RN notified, Repositioned  Home Living                                          Prior Functioning/Environment              Frequency  Min 2X/week        Progress Toward Goals  OT Goals(current goals can now be found in the care plan section)  Progress towards OT goals: Progressing toward goals  Acute Rehab OT Goals Patient Stated Goal: to go to rehab OT Goal Formulation: With patient Time For Goal Achievement: 01/02/24 Potential to Achieve Goals: Good ADL Goals Pt Will Perform Lower Body Bathing: sitting/lateral leans;with mod assist;sit to/from stand Pt Will Perform Lower Body Dressing: with mod assist;sitting/lateral leans;sit to/from stand Pt Will Transfer to Toilet: with min assist;with transfer board;anterior/posterior transfer;bedside commode Pt/caregiver will Perform Home Exercise Program: Increased strength;Both right and left upper extremity;With theraband;Independently;With written HEP provided  Plan      Co-evaluation                 AM-PAC OT 6 Clicks Daily Activity     Outcome Measure   Help from another person eating meals?: None Help from another person taking care of personal grooming?: A Little Help  from another person toileting, which includes using toliet, bedpan, or urinal?: A Lot Help from another person bathing (including washing, rinsing, drying)?: A Lot Help from another person to put on and taking off regular upper body clothing?: A Little Help from another person to put on and taking off regular lower body clothing?: A Lot 6 Click Score: 16    End of Session Equipment Utilized During Treatment: Gait belt  OT Visit Diagnosis: Unsteadiness on feet (R26.81);Other abnormalities of gait and mobility (R26.89);Muscle weakness (generalized) (M62.81)   Activity Tolerance Patient tolerated treatment well   Patient Left in chair;with call bell/phone within reach   Nurse Communication Mobility status;Patient requests pain meds        Time: 0756-0828 OT Time Calculation (min): 32 min  Charges: OT General Charges $OT Visit: 1 Visit OT Treatments $Self Care/Home Management : 8-22 mins $Therapeutic Exercise: 8-22 mins  Anitra Barn, OTA Acute Rehabilitation Services  Office [phone]   Ola Fawver Bennetta Braun  12/27/2023, 11:28 AM

## 2023-12-27 NOTE — Discharge Summary (Signed)
 Physician Discharge Summary   Patient: Dana [name] MRN: [id number] DOB: 27-Feb-1961  Admit date:     12/14/2023  Discharge date: 12/27/23  Discharge Physician: Ephriam Hashimoto   PCP: Mercy Stall, MD     Recommendations at discharge:  Follow up with Dr. Julio Ohm Orthopedics for post-op care of BKA Follow up with PCP Mercy Stall 1 week after discharge for MRSA bactermia Continue daptomycin  until 6/14 Repeat CBC and BMP on 6/14 Resume torsemide  when appropriate     Discharge Diagnoses: Principal Problem:   Severe sepsis secondary to postoperative left ankle infection and MRSA bacteremia Active Problems:   Acute on chronic combined systolic and diastolic CHF (congestive heart failure) (HCC)   Anemia in CKD (chronic kidney disease)   Class 2 severe obesity due to excess calories with serious comorbidity and body mass index (BMI) of 39.0 to 39.9 in adult (HCC)   OSA on CPAP   Uncontrolled type 1 diabetes mellitus with hyperglycemia, with long-term current use of insulin  (HCC)   CKD stage 4 due to type 1 diabetes mellitus (HCC) - baseline 2.2 - 2.4   Coronary artery disease involving native coronary artery of native heart with angina pectoris (HCC)   MRSA bacteremia - end of therapy with Daptomycin  12/28/2023.   Depression with anxiety   Acute pyelonephritis   S/P BKA (below knee amputation), left [facility])      Hospital Course: 63 y.o. F with CKD IV baseline 2.2-2.4, IDDM, history left great toe amputation, CAD, HFmrEF and chronic respiratory failure and recent left trimalleolar ankle fracture/dislocation s/p ORIF 4/30 who presented to OSH 5/29 with fever.    After admission, noted to have drainage from recent surgical wound, so transferred to [facility] for orthopedic evaluation.  Found to have MRSA bacteremia, and Orthopedics recommended BKA.      Significant events: 5/31>> transferred from [facility] health-MRSA bacteremia/left ankle infection; ID and ortho consulted 6/01>>  Psychiatry consulted 6/02>> Cardiology consulted 6/04>> left BKA 6/09>> TEE rules out endocarditis    Significant studies: 5/31>> CT abdomen/pelvis: No hydronephrosis, nonspecific bilateral perinephric fat stranding, enlarged left ovary with surrounding inflammatory stranding. 5/31>> pelvic ultrasound: S/p hysterectomy. 6/01>> TTE: EF 40-45%, s/p mitral clip-mild to moderate MR. 6/01>> B/L lower extremity Doppler: No DVT. 6/02>> VQ scan: No PE 6/09>> TEE: no vegetation, MR severe   Significant microbiology data: 5/29>> OSH blood cultures: MRSA reportedly 5/31>> repeat blood culture: No growth 5/31>> UCx: no growth    Procedures: 6/4>> left BKA    Consults: Orthopedics ID Cardiology Palliative Care Psychiatry      * Severe sepsis secondary to postoperative left ankle infection and MRSA bacteremia S/P BKA (below knee amputation), left (HCC) MRSA bacteremia - end of therapy with Daptomycin  12/28/2023. Admitted and orthopedics consulted.  They recommended amputation which was completed on 6/4 by Dr. Julio Ohm  ID were consulted for MRSA bacteremia in cultures from OSH.  Source likely foot infection and no other satellite sites of infection detected.  Underwent TEE that was negative for vegetation and ID recommended 2 weeks daptomycin  from first negative culture on 5/31.    Acute on chronic combined systolic and diastolic CHF (congestive heart failure) (HCC) Acute right heart failure History of MR s/p MitraClip 2022 Respiratory failure ruled out.  She did have some CHF that was treated with diuretics.  Now appears euvolemic to dry.  - Hold torsemide  given mildly increased Cr   CKD stage 4 due to type 1 diabetes mellitus (HCC) - baseline 2.2 - 2.4  Cr stable with IV Lasix  but trending up in the last few days in setting of resumption of torsemide  and worsening anemia  Transfuse 1u PRBCs as below.  Hold torsemide .  Discussed with Dr. Rachel Budds.  Trend Cr at  CIR.   Post-operative acute blood loss anemia on anemia of CKD Baseline Hgb ~9 g/dL.  Post operatively, due to oozing from stump, Hgb down to 7.1 on day of discharge to CIR.  Transfused 1u PRBCs and will observe in rehab.  Okay to continue Brilinta  for now.   Acute pyelonephritis Completed treatment  Depression with anxiety Generalized anxiety disorder Stable on BuSpar .  Started on Lexapro  here  Coronary artery disease involving native coronary artery of native heart with angina pectoris (HCC) Stable on aspirin , Coreg , Zetia , Brilinta      Uncontrolled type 1 diabetes mellitus with hyperglycemia, with long-term current use of insulin  (HCC) On lower doses glargine and lispro.  OSA on CPAP             The [place]  Controlled Substances Registry was reviewed for this patient prior to discharge.  Consultants: Orthopedics Psychiatry  Procedures performed: BKA   Disposition: Inpatient rehab Diet recommendation:  Cardiac diet  DISCHARGE MEDICATION: Allergies as of 12/27/2023       Reactions   Naproxen Hives, Rash   Shellfish-derived Products Hives, Swelling, Other (See Comments)   Angioedema   Strawberry (diagnostic) Hives   Celecoxib Other (See Comments)   Stomach pain   Doxepin  Hcl Other (See Comments)   Lethargic, sleeping a lot per pt and family   Glucosamine Hives, Swelling, Other (See Comments)   Angioedema   Iron  Hives, Other (See Comments)   IV iron  transfusion - hives - Feb 2022 hospitalization Patient said she CAN tolerate if given Benadryl  first   Metoclopramide Other (See Comments)   Facial twitching and stuttering   Metolazone  Other (See Comments)   Acute renal failure   Statins Other (See Comments)   Muscle pain        Medication List     PAUSE taking these medications    isosorbide  mononitrate 30 MG 24 hr tablet Wait to take this until your doctor or other care provider tells you to start again. Commonly known as: IMDUR  Take 1  tablet (30 mg total) by mouth daily.   Lantus  SoloStar 100 UNIT/ML Solostar Pen Wait to take this until your doctor or other care provider tells you to start again. Generic drug: insulin  glargine Inject 45 Units into the skin daily. What changed: how much to take   NovoLOG  FlexPen 100 UNIT/ML FlexPen Wait to take this until your doctor or other care provider tells you to start again. Generic drug: insulin  aspart Inject 2 - 10 Units before meals based on SSI. You also have another medication with the same name that you may need to continue taking.   potassium chloride  SA 20 MEQ tablet Wait to take this until your doctor or other care provider tells you to start again. Commonly known as: KLOR-CON  M Take 1 tablet (20 mEq total) by mouth daily.   torsemide  20 MG tablet Wait to take this until your doctor or other care provider tells you to start again. Commonly known as: DEMADEX  Take 3 tablets (60 mg total) by mouth daily.       STOP taking these medications    doxepin  150 MG capsule Commonly known as: SINEQUAN    HYDROcodone -acetaminophen  7.5-325 MG tablet Commonly known as: NORCO   methocarbamol  500 MG tablet  Commonly known as: ROBAXIN    orphenadrine  100 MG tablet Commonly known as: NORFLEX        TAKE these medications    acetaminophen  500 MG tablet Commonly known as: TYLENOL  Take 1,000 mg by mouth every 6 (six) hours as needed for mild pain (pain score 1-3) or headache.   allopurinol  100 MG tablet Commonly known as: ZYLOPRIM  Take 0.5 tablets (50 mg total) by mouth daily.   Aspirin  Low Dose 81 MG tablet Generic drug: aspirin  EC Take 1 tablet (81 mg total) by mouth daily. Swallow whole.   BD Pen Needle Nano 2nd Gen 32G X 4 MM Misc Generic drug: Insulin  Pen Needle Inject 1 each into the skin in the morning, at noon, in the evening, and at bedtime.   Brilinta  90 MG Tabs tablet Generic drug: ticagrelor  Take 1 tablet (90 mg total) by mouth 2 (two) times  daily.   busPIRone  15 MG tablet Commonly known as: BUSPAR  Take 15 mg by mouth 3 (three) times daily.   carvedilol  6.25 MG tablet Commonly known as: COREG  Take 1.5 tablets (9.375 mg total) by mouth 2 (two) times daily.   cholecalciferol  25 MCG (1000 UNIT) tablet Commonly known as: VITAMIN D3 Take 2 tablets (2,000 Units total) by mouth 2 (two) times daily.   daptomycin  700-0.9 MG/100ML-% Soln Commonly known as: CUBICIN  Inject 100 mLs (700 mg total) into the vein every other day. Start taking on: December 28, 2023   EPINEPHrine  0.3 mg/0.3 mL Soaj injection Commonly known as: EPI-PEN Use as directed for life-threatening allergic reaction.   escitalopram  10 MG tablet Commonly known as: LEXAPRO  Take 1 tablet (10 mg total) by mouth daily. Start taking on: December 28, 2023   ezetimibe  10 MG tablet Commonly known as: ZETIA  Take 1 tablet (10 mg total) by mouth every evening.   FISH OIL PO Take 1 capsule by mouth daily.   FreeStyle Libre 2 Sensor Misc Inject 1 device into skin every 14 (fourteen) days.   gabapentin  300 MG capsule Commonly known as: Neurontin  Take 1 capsule (300 mg total) by mouth 3 (three) times daily.   Gvoke HypoPen  2-Pack 0.5 MG/0.1ML Soaj Generic drug: Glucagon  Inject 0.5 mg into the skin daily as needed. What changed: reasons to take this   [facility] Extra Strength 10-30 % Crea Apply 1 Application topically as needed (bilateral hips and knees).   insulin  glargine-yfgn 100 UNIT/ML injection Commonly known as: SEMGLEE  Inject 0.05 mLs (5 Units total) into the skin daily. Start taking on: December 28, 2023   latanoprost  0.005 % ophthalmic solution Commonly known as: XALATAN  Place 1 drop into both eyes at bedtime.   Linzess  145 MCG Caps capsule Generic drug: linaclotide  Take 1 capsule (145 mcg total) by mouth daily before breakfast. What changed:  when to take this reasons to take this   LORazepam  0.5 MG tablet Commonly known as: ATIVAN  Take 1 tablet (0.5  mg total) by mouth daily as needed for anxiety.   magnesium  oxide 400 MG tablet Commonly known as: MAG-OX Take 2 tablets (800 mg total) by mouth daily. Take 2 (400 mg) daily=800 mg   nitroGLYCERIN  0.4 MG SL tablet Commonly known as: NITROSTAT  DISSOLVE ONE TABLET UNDER THE TONGUE EVERY 5 MINUTES AS NEEDED FOR CHEST PAIN.  DO NOT EXCEED A TOTAL OF 3 DOSES IN 15 MINUTES   insulin  aspart 100 UNIT/ML injection Commonly known as: novoLOG  Inject 0-6 Units into the skin 4 (four) times daily -  before meals and at bedtime. What changed:  Another medication with the same name was paused. Ask your nurse or doctor if you should take this medication.   insulin  aspart 100 UNIT/ML injection Commonly known as: novoLOG  Inject 3 Units into the skin 3 (three) times daily with meals. What changed: Another medication with the same name was paused. Ask your nurse or doctor if you should take this medication.   oxyCODONE  HCl 7.5 MG Tabs Take 7.5 mg by mouth every 4 (four) hours as needed for moderate pain (pain score 4-6).   OXYGEN  Inhale 2 L/min into the lungs at bedtime.   pantoprazole  40 MG tablet Commonly known as: PROTONIX  Take 1 tablet (40 mg total) by mouth daily.   polyethylene glycol 17 g packet Commonly known as: MIRALAX  / GLYCOLAX  Take 17 g by mouth daily. Start taking on: December 28, 2023   Probiotic Caps Take 1 capsule by mouth daily.   promethazine  25 MG tablet Commonly known as: PHENERGAN  Take 1 tablet (25 mg total) by mouth daily as needed.   Repatha  SureClick 140 MG/ML Soaj Generic drug: Evolocumab  Inject 140 mg into the skin every 14 (fourteen) days.               Discharge Care Instructions  (From admission, onward)           Start     Ordered   12/27/23 0000  Discharge wound care:       Comments: Barrier ring at each end and in the center to promote seal.  Change MONDAY and THURSDAY Question:  Amount of suction:  125 mm/Hg   12/27/23 1037             Follow-up Information     Timothy Ford, MD Follow up in 1 week(s).   Specialty: Orthopedic Surgery Contact information: [address] [place]  [place] [address] [phone]                 Discharge Instructions     Discharge wound care:   Complete by: As directed    Barrier ring at each end and in the center to promote seal.  Change MONDAY and THURSDAY Question:  Amount of suction:  125 mm/Hg   Increase activity slowly   Complete by: As directed        Discharge Exam: Filed Weights   12/14/23 0100 12/14/23 0200  Weight: 103.3 kg 98 kg    General: Pt is alert, awake, not in acute distress, sitting up in bed, slightly pale Cardiovascular: RRR, nl S1-S2, no murmurs appreciated.   No LE edema.   Respiratory: Normal respiratory rate and rhythm.  CTAB without rales or wheezes. MSK: Stump in shrinker, no drainage in canister, no cellulitis. Abdominal: Abdomen soft and non-tender.  No distension or HSM.   Neuro/Psych: Strength symmetric in upper and lower extremities.  Judgment and insight appear normal.   Condition at discharge: fair  The results of significant diagnostics from this hospitalization (including imaging, microbiology, ancillary and laboratory) are listed below for reference.   Imaging Studies: ECHO TEE Result Date: 12/23/2023    TRANSESOPHOGEAL ECHO REPORT   Patient Name:   Dana [name] Date of Exam: 12/23/2023 Medical Rec #:  [id number]      Height:       65.0 in Accession #:    [id number]     Weight:       216.0 lb Date of Birth:  January 10, 1961      BSA:  2.044 m Patient Age:    62 years       BP:           144/62 mmHg Patient Gender: F              HR:           79 bpm. Exam Location:  Inpatient Procedure: Transesophageal Echo, 3D Echo, Cardiac Doppler and Color Doppler            (Both Spectral and Color Flow Doppler were utilized during            procedure). Indications:     Endocarditis  History:         Patient has prior history of Echocardiogram  examinations, most                  recent 12/15/2023. CHF, CAD, Mitral Valve Disease,                  Arrythmias:Atrial Fibrillation; Risk Factors:Hypertension,                  Diabetes and Sleep Apnea.                   Mitral Valve: Mitra-Clip valve is present in the mitral                  position.  Sonographer:     Astrid Blamer Referring Phys:  [id number] BRIDGETTE Selby Foisy Diagnosing Phys: Carson Clara MD PROCEDURE: After discussion of the risks and benefits of a TEE, an informed consent was obtained from the patient. The transesophogeal probe was passed without difficulty through the esophogus of the patient. Sedation performed by different physician. The patient was monitored while under deep sedation. Anesthestetic sedation was provided intravenously by Anesthesiology: 305mg  of Propofol , 60mg  of Lidocaine . The patient developed no complications during the procedure.  IMPRESSIONS  1. Left ventricular ejection fraction, by estimation, is 50 to 55%. The left ventricle has low normal function.  2. Right ventricular systolic function is normal. The right ventricular size is mildly enlarged.  3. Left atrial size was mildly dilated. No left atrial/left atrial appendage thrombus was detected.  4. The aortic valve is tricuspid. Aortic valve regurgitation is not visualized. No aortic stenosis is present.  5. 3D performed of the mitral valve, 3D performed of the aortic valve and 3D performed of the atrial septum  6. Evidence of atrial level shunting detected by color flow Doppler. Known iatrogenic ASD after mitraclip with left to right flow.  7. There is a Mitra-Clip present in the mitral position.     Severe mitral valve regurgitation. No evidence of mitral stenosis. The mean mitral valve gradient is 4.0 mmHg with average heart rate of 65 bpm. Blunting but no clear reversal of pulmonary vein systolic flow. Thre are 2 MR jets with 3D VCA 0.35cm^2 for larger jet and 0.11cm^2 for smaller jet, with total 3D VCA  0.46cm^2 suggesting severe MR. Conclusion(s)/Recommendation(s): No evidence of vegetation/infective endocarditis on this transesophageael echocardiogram. FINDINGS  Left Ventricle: Left ventricular ejection fraction, by estimation, is 50 to 55%. The left ventricle has low normal function. The left ventricular internal cavity size was normal in size. Right Ventricle: The right ventricular size is mildly enlarged. No increase in right ventricular wall thickness. Right ventricular systolic function is normal. Left Atrium: Left atrial size was mildly dilated. No left atrial/left atrial appendage thrombus was detected. Right Atrium: Right atrial size was normal in size.  Pericardium: There is no evidence of pericardial effusion. Mitral Valve: The mitral valve has been repaired/replaced. Severe mitral valve regurgitation. There is a Mitra-Clip present in the mitral position. No evidence of mitral valve stenosis. MV peak gradient, 14.0 mmHg. The mean mitral valve gradient is 4.0 mmHg with average heart rate of 65 bpm. Tricuspid Valve: The tricuspid valve is normal in structure. Tricuspid valve regurgitation is trivial. Aortic Valve: The aortic valve is tricuspid. Aortic valve regurgitation is not visualized. No aortic stenosis is present. Pulmonic Valve: The pulmonic valve was not well visualized. Pulmonic valve regurgitation is trivial. Aorta: The aortic root and ascending aorta are structurally normal, with no evidence of dilitation. IAS/Shunts: Evidence of atrial level shunting detected by color flow Doppler. Additional Comments: 3D was performed not requiring image post processing on an independent workstation and was abnormal. MITRAL VALVE MV Area (PHT): 2.56 cm MV Peak grad:  14.0 mmHg MV Mean grad:  4.0 mmHg MV Vmax:       1.87 m/s MV Vmean:      90.5 cm/s MV Decel Time: 296 msec MV E velocity: 178.00 cm/s Carson Clara MD Electronically signed by Carson Clara MD Signature Date/Time: 12/23/2023/12:30:49  PM    Final    EP STUDY Result Date: 12/23/2023 See surgical note for result.  NM Pulmonary Perfusion Result Date: 12/16/2023 CLINICAL DATA:  right heart failure, recent surgery, EXAM: NUCLEAR MEDICINE PERFUSION LUNG SCAN TECHNIQUE: Perfusion images were obtained in multiple projections after intravenous injection of radiopharmaceutical. No ventilation imaging performed. RADIOPHARMACEUTICALS:  4.0 mCi Tc-79m MAA IV COMPARISON:  Chest radiographs 12/16/2023 and 12/14/2023. Nuclear medicine perfusion scans 08/27/2023 and 11/01/2016. FINDINGS: Mildly heterogeneous left perihilar perfusion, similar to previous lung scan. No new or wedge-shaped perfusion defects are demonstrated to suggest acute pulmonary embolism. The right lung perfusion appears normal. IMPRESSION: No evidence of acute pulmonary embolism on perfusion scintigraphy by PISAPED criteria. Mildly heterogeneous left perihilar perfusion, similar to previous examination. Electronically Signed   By: Elmon Hagedorn M.D.   On: 12/16/2023 13:06   DG Chest 1 View Result Date: 12/16/2023 CLINICAL DATA:  For comparison with the 2 scan EXAM: CHEST  1 VIEW COMPARISON:  12/14/2023 FINDINGS: Cardiomegaly. No confluent airspace opacities, effusions or overt edema. No acute bony abnormality. IMPRESSION: Stable cardiomegaly.  No active disease. Electronically Signed   By: Janeece Mechanic M.D.   On: 12/16/2023 12:19   ECHOCARDIOGRAM COMPLETE Result Date: 12/15/2023    ECHOCARDIOGRAM REPORT   Patient Name:   Dana [name] Date of Exam: 12/15/2023 Medical Rec #:  [id number]      Height:       65.0 in Accession #:    [id number]     Weight:       216.0 lb Date of Birth:  11-23-1960      BSA:          2.044 m Patient Age:    62 years       BP:           134/52 mmHg Patient Gender: F              HR:           76 bpm. Exam Location:  Inpatient Procedure: 2D Echo, Cardiac Doppler and Color Doppler (Both Spectral and Color            Flow Doppler were utilized during procedure).  Indications:    Bacteremia  History:        Patient has prior  history of Echocardiogram examinations, most                 recent 08/27/2023.  Sonographer:    Andrena Bang Referring Phys: [id number] TIMOTHY S OPYD  Sonographer Comments: Technically difficult study due to poor echo windows, suboptimal parasternal window and suboptimal subcostal window. IMPRESSIONS  1. Left ventricular ejection fraction, by estimation, is 40 to 45%. The left ventricle has mildly decreased function. The left ventricle demonstrates global hypokinesis. There is mild left ventricular hypertrophy. Left ventricular diastolic function could not be evaluated. There is the interventricular septum is flattened in systole and diastole, consistent with right ventricular pressure and volume overload.  2. Right ventricular systolic function is low normal. The right ventricular size is enlarged. There is severely elevated pulmonary artery systolic pressure. The estimated right ventricular systolic pressure is 65.7 mmHg.  3. Left atrial size was mildly dilated.  4. S/P mitral clip, mild to moderate MR, no valvular stenosis (MG 5 mmHg, HR 72bpm). . The mean mitral valve gradient is 5.0 mmHg with average heart rate of 72 bpm.  5. Tricuspid valve regurgitation is moderate.  6. The aortic valve is tricuspid. Aortic valve regurgitation is not visualized. Aortic valve sclerosis is present, with no evidence of aortic valve stenosis.  7. Pulmonic valve regurgitation is moderate.  8. The inferior vena cava is dilated in size with <50% respiratory variability, suggesting right atrial pressure of 15 mmHg. Comparison(s): A prior study was performed on 08/27/2023. Right ventricular size is now enlarged, RVSP suggestive of severely elevated pulmonary pressures, interventricular septum is flattened in systole and diastole, consistent with right ventricular pressure and volume overload, RAP is now . Conclusion(s)/Recommendation(s): No evidence of valvular  vegetations on this transthoracic echocardiogram. Consider a transesophageal echocardiogram to exclude infective endocarditis if clinically indicated. Given the dilated RV, elevated RVSP, and low normal systolic function, rule out pulmonary embolism, if clinically indicated. FINDINGS  Left Ventricle: Left ventricular ejection fraction, by estimation, is 40 to 45%. The left ventricle has mildly decreased function. The left ventricle demonstrates global hypokinesis. Definity  contrast agent was given IV to delineate the left ventricular  endocardial borders. The left ventricular internal cavity size was normal in size. There is mild left ventricular hypertrophy. The interventricular septum is flattened in systole and diastole, consistent with right ventricular pressure and volume overload. Left ventricular diastolic function could not be evaluated due to mitral valve repair. Left ventricular diastolic function could not be evaluated. Right Ventricle: The right ventricular size is enlarged. Right vetricular wall thickness was not well visualized. Right ventricular systolic function is low normal. There is severely elevated pulmonary artery systolic pressure. The tricuspid regurgitant velocity is 3.56 m/s, and with an assumed right atrial pressure of 15 mmHg, the estimated right ventricular systolic pressure is 65.7 mmHg. Left Atrium: Left atrial size was mildly dilated. Right Atrium: Right atrial size was not well visualized. Pericardium: There is no evidence of pericardial effusion. Mitral Valve: S/P mitral clip, mild to moderate MR, no valvular stenosis (MG 5 mmHg, HR 72bpm). MV peak gradient, 16.8 mmHg. The mean mitral valve gradient is 5.0 mmHg with average heart rate of 72 bpm. Tricuspid Valve: The tricuspid valve is normal in structure. Tricuspid valve regurgitation is moderate . No evidence of tricuspid stenosis. Aortic Valve: The aortic valve is tricuspid. Aortic valve regurgitation is not visualized. Aortic  valve sclerosis is present, with no evidence of aortic valve stenosis. Aortic valve mean gradient measures 3.0 mmHg. Aortic valve peak gradient measures 6.0  mmHg. Aortic valve area, by VTI measures 1.81 cm. Pulmonic Valve: The pulmonic valve was normal in structure. Pulmonic valve regurgitation is moderate. No evidence of pulmonic stenosis. Aorta: The aortic root and ascending aorta are structurally normal, with no evidence of dilitation. Venous: The inferior vena cava is dilated in size with less than 50% respiratory variability, suggesting right atrial pressure of 15 mmHg. IAS/Shunts: The interatrial septum was not well visualized.  LEFT VENTRICLE PLAX 2D LVIDd:         5.10 cm      Diastology LVIDs:         3.50 cm      LV e' medial: 3.26 cm/s LV PW:         1.10 cm LV IVS:        1.10 cm LVOT diam:     1.80 cm LV SV:         42 LV SV Index:   20 LVOT Area:     2.54 cm  LV Volumes (MOD) LV vol d, MOD A2C: 196.0 ml LV vol d, MOD A4C: 210.0 ml LV vol s, MOD A2C: 99.5 ml LV vol s, MOD A4C: 105.0 ml LV SV MOD A2C:     96.5 ml LV SV MOD A4C:     210.0 ml LV SV MOD BP:      104.4 ml RIGHT VENTRICLE RV S prime:     9.14 cm/s TAPSE (M-mode): 1.7 cm LEFT ATRIUM             Index LA Vol (A2C):   77.5 ml 37.91 ml/m LA Vol (A4C):   62.3 ml 30.48 ml/m LA Biplane Vol: 73.9 ml 36.15 ml/m  AORTIC VALVE AV Area (Vmax):    1.76 cm AV Area (Vmean):   1.74 cm AV Area (VTI):     1.81 cm AV Vmax:           122.00 cm/s AV Vmean:          72.300 cm/s AV VTI:            0.231 m AV Peak Grad:      6.0 mmHg AV Mean Grad:      3.0 mmHg LVOT Vmax:         84.20 cm/s LVOT Vmean:        49.300 cm/s LVOT VTI:          0.164 m LVOT/AV VTI ratio: 0.71  AORTA Ao Sinus diam: 2.90 cm Ao Asc diam:   3.10 cm MITRAL VALVE              TRICUSPID VALVE MV Area VTI:  0.82 cm    TR Peak grad:   50.7 mmHg MV Peak grad: 16.8 mmHg   TR Vmax:        356.00 cm/s MV Mean grad: 5.0 mmHg MV Vmax:      2.05 m/s    SHUNTS MV Vmean:     99.4 cm/s   Systemic  VTI:  0.16 m MR Peak grad: 101.6 mmHg  Systemic Diam: 1.80 cm MR Mean grad: 65.0 mmHg MR Vmax:      504.00 cm/s MR Vmean:     378.0 cm/s Sunit Tolia Electronically signed by Olinda Bertrand Signature Date/Time: 12/15/2023/8:02:30 PM    Final    VAS US  LOWER EXTREMITY VENOUS (DVT) Result Date: 12/15/2023  Lower Venous DVT Study Patient Name:  LYLA JASEK  Date of Exam:   12/15/2023 Medical Rec #: [id number]  Accession #:    [id number] Date of Birth: 1961/04/20       Patient Gender: F Patient Age:   32 years Exam Location:  [facility] Procedure:      VAS US  LOWER EXTREMITY VENOUS (DVT) Referring Phys: DAWOOD ELGERGAWY --------------------------------------------------------------------------------  Indications: Edema.  Risk Factors: None identified. Limitations: Poor ultrasound/tissue interface and patient pain tolerance. Comparison Study: No prior studies. Performing Technologist: Lerry Ransom RVT  Examination Guidelines: A complete evaluation includes B-mode imaging, spectral Doppler, color Doppler, and power Doppler as needed of all accessible portions of each vessel. Bilateral testing is considered an integral part of a complete examination. Limited examinations for reoccurring indications may be performed as noted. The reflux portion of the exam is performed with the patient in reverse Trendelenburg.  +---------+---------------+---------+-----------+----------+--------------+ RIGHT    CompressibilityPhasicitySpontaneityPropertiesThrombus Aging +---------+---------------+---------+-----------+----------+--------------+ CFV      Full           Yes      Yes                                 +---------+---------------+---------+-----------+----------+--------------+ SFJ      Full                                                        +---------+---------------+---------+-----------+----------+--------------+ FV Prox  Full                                                         +---------+---------------+---------+-----------+----------+--------------+ FV Mid   Full                                                        +---------+---------------+---------+-----------+----------+--------------+ FV Distal               Yes      Yes                                 +---------+---------------+---------+-----------+----------+--------------+ PFV      Full                                                        +---------+---------------+---------+-----------+----------+--------------+ POP      Full           Yes      Yes                                 +---------+---------------+---------+-----------+----------+--------------+ PTV      Full                                                        +---------+---------------+---------+-----------+----------+--------------+  PERO     Full                                                        +---------+---------------+---------+-----------+----------+--------------+   +---------+---------------+---------+-----------+----------+-------------------+ LEFT     CompressibilityPhasicitySpontaneityPropertiesThrombus Aging      +---------+---------------+---------+-----------+----------+-------------------+ CFV      Full           Yes      Yes                                      +---------+---------------+---------+-----------+----------+-------------------+ SFJ      Full                                                             +---------+---------------+---------+-----------+----------+-------------------+ FV Prox  Full                                                             +---------+---------------+---------+-----------+----------+-------------------+ FV Mid                  Yes      Yes                                      +---------+---------------+---------+-----------+----------+-------------------+ FV Distal               Yes      Yes                                       +---------+---------------+---------+-----------+----------+-------------------+ PFV      Full                                                             +---------+---------------+---------+-----------+----------+-------------------+ POP      Full           Yes      Yes                                      +---------+---------------+---------+-----------+----------+-------------------+ PTV      Full                                                             +---------+---------------+---------+-----------+----------+-------------------+ PERO  Not well visualized +---------+---------------+---------+-----------+----------+-------------------+     Summary: RIGHT: - There is no evidence of deep vein thrombosis in the lower extremity. However, portions of this examination were limited- see technologist comments above.  - No cystic structure found in the popliteal fossa.  LEFT: - There is no evidence of deep vein thrombosis in the lower extremity. However, portions of this examination were limited- see technologist comments above.  - No cystic structure found in the popliteal fossa.  *See table(s) above for measurements and observations. Electronically signed by Jimmye Moulds MD on 12/15/2023 at 12:20:09 PM.    Final    US  PELVIS (TRANSABDOMINAL ONLY) Result Date: 12/14/2023 CLINICAL DATA:  Pelvic inflammatory disease EXAM: TRANSABDOMINAL ULTRASOUND OF PELVIS TECHNIQUE: Transabdominal ultrasound examination of the pelvis was performed including evaluation of the uterus, ovaries, adnexal regions, and pelvic cul-de-sac. COMPARISON:  CT 12/14/2023 FINDINGS: Uterus Absent Right ovary Not visualized.  No definite adnexal mass identified. Left ovary Not visualized.  No definite adnexal mass identified. Other findings: Trace simple appearing free fluid within the right adnexa best seen on cine images. IMPRESSION: 1. Status post  hysterectomy. 2. Nonvisualization of the ovaries bilaterally. If indicated, pelvic MRI examination with contrast may be helpful for further evaluation. 3. Trace simple appearing free fluid within the right adnexa, nonspecific. Electronically Signed   By: Worthy Heads M.D.   On: 12/14/2023 19:23   CT RENAL STONE STUDY Result Date: 12/14/2023 CLINICAL DATA:  Sepsis with bilateral pyelonephritis. Evaluate for hydronephrosis. EXAM: CT ABDOMEN AND PELVIS WITHOUT CONTRAST TECHNIQUE: Multidetector CT imaging of the abdomen and pelvis was performed following the standard protocol without IV contrast. RADIATION DOSE REDUCTION: This exam was performed according to the departmental dose-optimization program which includes automated exposure control, adjustment of the mA and/or kV according to patient size and/or use of iterative reconstruction technique. COMPARISON:  CT abdomen and pelvis 12/13/2023. FINDINGS: Lower chest: There is atelectasis in the lung bases. There are trace bilateral pleural effusions. The heart is enlarged. Hepatobiliary: No focal liver abnormality is seen. Status post cholecystectomy. No biliary dilatation. Pancreas: Unremarkable. No pancreatic ductal dilatation or surrounding inflammatory changes. Spleen: Mildly enlarged. Adrenals/Urinary Tract: Adrenal glands are unremarkable. Kidneys are normal, without renal calculi, focal lesion, or hydronephrosis. There is mild nonspecific bilateral perinephric fat stranding, mildly increased. Bladder is unremarkable. Stomach/Bowel: Stomach is within normal limits. Appendix is not seen. No evidence of bowel wall thickening, distention, or inflammatory changes. Vascular/Lymphatic: Aortic atherosclerosis. No enlarged abdominal or pelvic lymph nodes. There are extensive vascular calcifications the abdomen and pelvis. Reproductive: Uterus is surgically absent. The left ovary appears enlarged with surrounding inflammatory stranding and fluid, a new finding. The  right ovary appears within normal limits. Other: There is mild body wall edema which has increased. There is no ascites, but there is increasing presacral edema. Medication injection sites are seen in the subcutaneous tissues of the lower anterior abdominal wall. Musculoskeletal: No fracture is seen. IMPRESSION: 1. No hydronephrosis. 2. Mild nonspecific bilateral perinephric fat stranding, mildly increased. Correlate clinically for infection. 3. Enlarged left ovary with surrounding inflammatory stranding and fluid, a new finding. Findings may be related to ovarian torsion or infection. Recommend further evaluation with pelvic ultrasound. 4. Increasing body wall edema and presacral edema. 5. Trace bilateral pleural effusions. 6. Cardiomegaly. 7. Mild splenomegaly. 8. Aortic atherosclerosis. Aortic Atherosclerosis (ICD10-I70.0). Electronically Signed   By: Tyron Gallon M.D.   On: 12/14/2023 16:54   DG Ankle Complete Left Result Date: 12/14/2023 CLINICAL DATA:  Surgical site  infection. EXAM: LEFT ANKLE COMPLETE - 3 VIEW COMPARISON:  12/13/2023 FINDINGS: Postop changes with subtalar fusion. Bimalleolar fractures. Ankle mortise disruption. Regional soft tissue swelling. IMPRESSION: Postop changes. Disrupted ankle mortise. Displaced fractures. Soft tissue swelling. Electronically Signed   By: Sydell Eva M.D.   On: 12/14/2023 08:30   DG CHEST PORT 1 VIEW Result Date: 12/14/2023 CLINICAL DATA:  Surgical site infection.  CVC placement. EXAM: PORTABLE CHEST 1 VIEW COMPARISON:  12/13/2023. FINDINGS: Prominent cardiac silhouette. Pulmonary vascular congestion. No focal consolidation. No pneumothorax or pleural effusion. Right IJ CVC tip overlies distal SVC. IMPRESSION: Prominent cardiac silhouette and vascular congestion. Electronically Signed   By: Sydell Eva M.D.   On: 12/14/2023 08:28    Microbiology: Results for orders placed or performed during the hospital encounter of 12/14/23  Urine Culture (for  pregnant, neutropenic or urologic patients or patients with an indwelling urinary catheter)     Status: Abnormal   Collection Time: 12/14/23  1:56 AM   Specimen: Urine, Clean Catch  Result Value Ref Range Status   Specimen Description URINE, CLEAN CATCH  Final   Special Requests NONE  Final   Culture (A)  Final    <10,000 COLONIES/mL INSIGNIFICANT GROWTH Performed at [facility], [address] [place], [place] [address]    Report Status 12/15/2023 FINAL  Final  Culture, blood (x 2)     Status: None   Collection Time: 12/14/23  2:14 AM   Specimen: BLOOD RIGHT ARM  Result Value Ref Range Status   Specimen Description BLOOD RIGHT ARM  Final   Special Requests   Final    BOTTLES DRAWN AEROBIC AND ANAEROBIC Blood Culture adequate volume   Culture   Final    NO GROWTH 5 DAYS Performed at [facility], [address] [place], [place] [address]    Report Status 12/19/2023 FINAL  Final  Culture, blood (x 2)     Status: None   Collection Time: 12/14/23  2:14 AM   Specimen: BLOOD RIGHT HAND  Result Value Ref Range Status   Specimen Description BLOOD RIGHT HAND  Final   Special Requests   Final    BOTTLES DRAWN AEROBIC AND ANAEROBIC Blood Culture adequate volume   Culture   Final    NO GROWTH 5 DAYS Performed at [facility], [address] [place], [place] [address]    Report Status 12/19/2023 FINAL  Final  Surgical pcr screen     Status: None   Collection Time: 12/18/23  9:15 AM   Specimen: Nasal Mucosa; Nasal Swab  Result Value Ref Range Status   MRSA, PCR NEGATIVE NEGATIVE Final   Staphylococcus aureus NEGATIVE NEGATIVE Final    Comment: (NOTE) The Xpert SA Assay (FDA approved for NASAL specimens in patients 31 years of age and older), is one component of a comprehensive surveillance program. It is not intended to diagnose infection nor to guide or monitor treatment. Performed at [facility], [address], [place], [place] [address]    *Note:  Due to a large number of results and/or encounters for the requested time period, some results have not been displayed. A complete set of results can be found in Results Review.    Labs: CBC: Recent Labs  Lab 12/23/23 0500 12/27/23 0628  WBC 12.6* 10.1  HGB 8.8* 7.3*  HCT 28.2* 23.6*  MCV 86.2 87.1  PLT 327 275   Basic Metabolic Panel: Recent Labs  Lab 12/21/23 0834  12/22/23 0404 12/25/23 0400 12/27/23 0628  NA 134* 135 134* 136  K 4.6 4.6 4.6 4.7  CL 101 101 95* 98  CO2 24 24 28 26   GLUCOSE 138* 150* 177* 125*  BUN 63* 61* 53* 57*  CREATININE 2.32* 2.22* 2.31* 2.71*  CALCIUM  8.2* 8.6* 8.3* 8.5*  MG  --   --  1.8  --    Liver Function Tests: No results for input(s): AST, ALT, ALKPHOS, BILITOT, PROT, ALBUMIN  in the last 168 hours. CBG: Recent Labs  Lab 12/26/23 0554 12/26/23 1205 12/26/23 1620 12/26/23 2212 12/27/23 0611  GLUCAP 156* 112* 116* 112* 124*    Discharge time spent: approximately 45 minutes spent on discharge counseling, evaluation of patient on day of discharge, and coordination of discharge planning with nursing, social work, pharmacy and case management  Signed: Ephriam Hashimoto, MD [facility] 12/27/2023

## 2023-12-27 NOTE — Evaluation (Signed)
 Occupational Therapy Assessment and Plan  Patient Details  Name: Dana [name] MRN: [id number] Date of Birth: [birth year]  OT Diagnosis: {diagnoses:3041644} Rehab Potential:   ELOS:     {CHL IP REHAB OT TIME CALCULATIONS:304400400}    Hospital Problem: Principal Problem:   Left below-knee amputee [facility])   Past Medical History:  Past Medical History:  Diagnosis Date   Acquired absence of left great toe (HCC)    Acquired absence of other left toe(s) (HCC)    ACS (acute coronary syndrome) (HCC)    Acute bronchitis due to COVID-19 virus 03/03/2023   Acute on chronic combined systolic and diastolic CHF (congestive heart failure) (HCC) 09/16/2023   Acute respiratory failure with hypoxia (HCC) 07/20/2023   Anemia    Anoxic brain injury (HCC)    Anxiety    CAD (coronary artery disease)    DES mLAD 04/07/15; DES dRCA & DES pPDA 08/30/16; DES mCX & PTCA OM1 09/29/20   Charcot's joint of foot in type 2 diabetes mellitus (HCC) 12/15/2023   Chronic diastolic (congestive) heart failure (HCC)    Chronic pain    Chronic systolic (congestive) heart failure (HCC)    CKD (chronic kidney disease)    Contrast dye induced nephropathy, possible 09/03/2020   COPD (chronic obstructive pulmonary disease) (HCC)    COVID-19 virus infection 03/03/2023   Demand ischemia (HCC)    Diabetes (HCC)type 1    Diastolic CHF (HCC)    Dyspnea    Encounter for screening mammogram for malignant neoplasm of breast 09/16/2023   Family history of breast cancer 10/23/2021   Gastro-esophageal reflux disease without esophagitis    Hyperlipidemia    Hypertension    Hypertensive heart disease with heart failure (HCC)    Hypoxemia    Idiopathic gout, multiple sites    Leukocytosis 03/29/2022   Localized edema    Low back pain    Mitral regurgitation    Mitral regurgitation    Mixed hyperlipidemia    Mixed incontinence    Morbid (severe) obesity due to excess calories (HCC)    Neuromuscular disorder (HCC)     Neuropathy    Non-ST elevation (NSTEMI) myocardial infarction (HCC)    08/29/20, 09/29/20   OSA (obstructive sleep apnea) 09/03/2020   Other specified hypothyroidism    PAF (paroxysmal atrial fibrillation) (HCC)    s/p pulmonary vein isolation by cryoablation 10/04/15 Dr. Jearldine Mina in [facility]   PEA (Pulseless electrical activity) [facility])    ~ 01/13/21 at [facility] during admission DKA, volume overload, possible CAP, hypoxia s/p ACLS with ROSC ~ 10-15   Pneumonia    Primary insomnia    Rheumatoid arthritis (HCC)    Stroke (HCC)    Thyroid  disease    Type 1 diabetes mellitus (HCC)    URI with cough and congestion 08/22/2023   Vitamin D  deficiency    Past Surgical History:  Past Surgical History:  Procedure Laterality Date   ABDOMINAL HYSTERECTOMY     Partial- Still has both ovaries   AMPUTATION Left 12/18/2023   Procedure: AMPUTATION BELOW KNEE;  Surgeon: Timothy Ford, MD;  Location: [facility] OR;  Service: Orthopedics;  Laterality: Left;   ANKLE FUSION Left 11/13/2023   Procedure: HINDFOOT FUSION FIXATION OF ANKLE;  Surgeon: Laneta Pintos, MD;  Location: [facility] OR;  Service: Orthopedics;  Laterality: Left;   CARDIOVASCULAR STRESS TEST  08/13/2014   Nuclear; Normal   CARPAL TUNNEL RELEASE     CATARACT EXTRACTION, BILATERAL     CESAREAN  SECTION     CHOLECYSTECTOMY     CORONARY ANGIOPLASTY WITH STENT PLACEMENT     3 blockages/ 1 stent   CORONARY BALLOON ANGIOPLASTY N/A 07/18/2023   Procedure: CORONARY BALLOON ANGIOPLASTY;  Surgeon: Odie Benne, MD;  Location: [facility] INVASIVE CV LAB;  Service: Cardiovascular;  Laterality: N/A;   CORONARY PRESSURE/FFR STUDY N/A 09/07/2022   Procedure: INTRAVASCULAR PRESSURE WIRE/FFR STUDY;  Surgeon: Swaziland, Peter M, MD;  Location: [facility] INVASIVE CV LAB;  Service: Cardiovascular;  Laterality: N/A;   CORONARY STENT INTERVENTION N/A 09/29/2020   Procedure: CORONARY STENT INTERVENTION;  Surgeon: Arnoldo Lapping, MD;  Location: [facility] INVASIVE CV LAB;  Service:  Cardiovascular;  Laterality: N/A;  CFX   CORONARY STENT INTERVENTION N/A 09/07/2022   Procedure: CORONARY STENT INTERVENTION;  Surgeon: Swaziland, Peter M, MD;  Location: [facility] INVASIVE CV LAB;  Service: Cardiovascular;  Laterality: N/A;   CORONARY STENT INTERVENTION N/A 02/13/2023   Procedure: CORONARY STENT INTERVENTION;  Surgeon: Odie Benne, MD;  [facility]: [facility] INVASIVE CV LAB;  Service: Cardiovascular;  Laterality: N/A;  LAD   CORONARY ULTRASOUND/IVUS N/A 02/13/2023   Procedure: Coronary Ultrasound/IVUS;  Surgeon: Odie Benne, MD;  Location: [facility] INVASIVE CV LAB;  Service: Cardiovascular;  Laterality: N/A;   CORONARY/GRAFT ACUTE MI REVASCULARIZATION N/A 12/08/2022   Procedure: Coronary/Graft Acute MI Revascularization;  Surgeon: Arleen Lacer, MD;  Location: [facility] INVASIVE CV LAB;  Service: Cardiovascular;  Laterality: N/A;   EYE SURGERY     LEFT HEART CATH AND CORONARY ANGIOGRAPHY N/A 09/29/2020   Procedure: LEFT HEART CATH AND CORONARY ANGIOGRAPHY;  Surgeon: Arnoldo Lapping, MD;  Location: [facility] INVASIVE CV LAB;  Service: Cardiovascular;  Laterality: N/A;   LEFT HEART CATH AND CORONARY ANGIOGRAPHY N/A 09/07/2022   Procedure: LEFT HEART CATH AND CORONARY ANGIOGRAPHY;  Surgeon: Swaziland, Peter M, MD;  Location: [facility] INVASIVE CV LAB;  Service: Cardiovascular;  Laterality: N/A;   LEFT HEART CATH AND CORONARY ANGIOGRAPHY N/A 12/08/2022   Procedure: LEFT HEART CATH AND CORONARY ANGIOGRAPHY;  Surgeon: Arleen Lacer, MD;  Location: [facility] INVASIVE CV LAB;  Service: Cardiovascular;  Laterality: N/A;   LEFT HEART CATH AND CORONARY ANGIOGRAPHY N/A 02/13/2023   Procedure: LEFT HEART CATH AND CORONARY ANGIOGRAPHY;  Surgeon: Odie Benne, MD;  [facility] INVAS[facility] CV LAB;  Service: Cardiovascular;  Laterality: N/A;   LEFT HEART CATH AND CORONARY ANGIOGRAPHY N/A 07/16/2023   Procedure: LEFT HEART CATH AND CORONARY ANGIOGRAPHY;  Surgeon: Odie Benne, MD;  [facility] INVASIVE  CV LAB;  Service: Cardiovascular;  Laterality: N/A;   RIGHT HEART CATH N/A 04/27/2021   Procedure: RIGHT HEART CATH;  Surgeon: Mardell Shade, MD;  Location: [facility] INVASIVE CV LAB;  Service: Cardiovascular;  Laterality: N/A;   RIGHT/LEFT HEART CATH AND CORONARY ANGIOGRAPHY N/A 01/07/2017   Procedure: Right/Left Heart Cath and Coronary Angiography;  Surgeon: Darlis Eisenmenger, MD;  Location: [facility] INVASIVE CV LAB;  Service: Cardiovascular;  Laterality: N/A;   ROTATOR CUFF REPAIR     TEE WITHOUT CARDIOVERSION N/A 04/28/2021   Procedure: TRANSESOPHAGEAL ECHOCARDIOGRAM (TEE);  Surgeon: Mardell Shade, MD;  Location: [facility] ENDOSCOPY;  Service: Cardiovascular;  Laterality: N/A;   TEE WITHOUT CARDIOVERSION N/A 05/18/2021   Procedure: TRANSESOPHAGEAL ECHOCARDIOGRAM (TEE);  Surgeon: Thukkani, Arun K, MD;  Location: [facility] INVASIVE CV LAB;  Service: Cardiovascular;  Laterality: N/A;   TOE AMPUTATION Left    TRANSCATHETER MITRAL EDGE TO EDGE REPAIR N/A 05/18/2021   Procedure: MITRAL VALVE REPAIR;  Surgeon: Thukkani, Arun K, MD;  Location: [facility] INVASIVE CV LAB;  Service: Cardiovascular;  Laterality: N/A;   TRANSESOPHAGEAL ECHOCARDIOGRAM (CATH LAB) N/A 12/23/2023   Procedure: TRANSESOPHAGEAL ECHOCARDIOGRAM;  Surgeon: Wendie Hamburg, MD;  Location: [facility] INVASIVE CV LAB;  Service: Cardiovascular;  Laterality: N/A;   US  ECHOCARDIOGRAPHY  05/2017   Normal    Assessment & Plan Clinical Impression: Patient is a 63 y.o. year old female admitted to [facility] on 12/14/2023 from [facility] after presenting to the ED 5-29 for fevers, dysuria, and flank pain. She was started on IV fluids and broad-spectrum antibiotics for suspected UTI with CT abdomen and pelvis showing bladder wall thickening on CT, and shortly after was noted to have purulent drainage from the left heel--wound cultures grew MRSA. She was transferred to [facility], Dr. Julio Ohm consulted and recommended left BKA due to complete  collapse of the left ankle after failed fusion, with cellulitis and ulceration in the setting of PVD and uncontrolled diabetes. She underwent left BKA on 6/4. Hospitalization was complicated by severe sepsis secondary to postop left ankle infection on IV vancomycin , Ancef  for E. coli pyelonephritis, acute on chronic HFrEF on diuretics, CKD stage IV, anemia, uncontrolled diabetes with A1c 10.2, severe depression and anxiety followed by psychiatry, and poor postop pain control managed with p.o. oxycodone  and IV Dilaudid .  Patient transferred to CIR on 12/27/2023 .    Patient currently requires {ZOX:0960454} with {UJW:1191478} secondary to {GNFAOZHYQMV:7846962}.  Prior to hospitalization, patient could complete *** with {XBM:8413244}.  Patient will benefit from skilled intervention to {benefit of skilled intervention:3041641} prior to discharge {discharge:3041642}.  Anticipate patient will require {supervision/assistance:22779} and {follow WN:0272536}.      OT Evaluation Precautions/Restrictions    Pain Pain Assessment Pain Scale: 0-10 Pain Score: 7  Pain Location: Leg Pain Intervention(s): Medication (See eMAR) Home Living/Prior Functioning Home Living Family/patient expects to be discharged to:: Private residence Living Arrangements: Spouse/significant other, Parent Vision   Perception    Praxis   Cognition   Sensation   Motor     Trunk/Postural Assessment     Balance   Extremity/Trunk Assessment      Care Tool Care Tool Self Care Eating        Oral Care         Bathing              Upper Body Dressing(including orthotics)            Lower Body Dressing (excluding footwear)          Putting on/Taking off footwear             Care Tool Toileting Toileting activity         Care Tool Bed Mobility Roll left and right activity        Sit to lying activity        Lying to sitting on side of bed activity         Care Tool Transfers Sit to  stand transfer        Chair/bed transfer         Toilet transfer         Care Tool Cognition  Expression of Ideas and Wants    Understanding Verbal and Non-Verbal Content     Memory/Recall Ability     Refer to Care Plan for Long Term Goals  SHORT TERM GOAL WEEK 1    Recommendations for other services: {RECOMMENDATIONS FOR OTHER SERVICES:3049016}   Skilled Therapeutic Intervention ADL   Mobility  Skilled Intervention Evaluation completed (see details above) with education on OT POC and goals and individual treatment initiated with focus on functional mobility/transfers, ADL re-training,  generalized strengthening and endurance, dynamic standing balance/coordination, pt/family education and discharge planning. Pt education provided on therapy schedule and safety policy with use of chair alarm. Pt participated in goal setting. Pt participated in modified ADL task (See above for functional performance).    Discharge Criteria: Patient will be discharged from OT if patient refuses treatment 3 consecutive times without medical reason, if treatment goals not met, if there is a change in medical status, if patient makes no progress towards goals or if patient is discharged from hospital.  The above assessment, treatment plan, treatment alternatives and goals were discussed and mutually agreed upon: {Assessment/Treatment Plan Discussed/Agreed:3049017}  [name], OTR/L,CBIS  Supplemental OT - [facility] and [facility] Secure Chat Preferred   12/27/2023, 9:52 PM

## 2023-12-27 NOTE — Progress Notes (Signed)
 Inpatient Rehab Admissions Coordinator:    I have insurance approval and a bed available for pt to admit to CIR today. Will let pt/family know.  TOC aware and Dr. Darlyn Eke in agreement.  She is pending a blood transfusion today but will not impede admission.    Loye Rumble, PT, DPT Admissions Coordinator [phone] 12/27/23  9:58 AM

## 2023-12-27 NOTE — Progress Notes (Signed)
 Bea Lime, DO  Physician Physical Medicine and Rehabilitation   Consult Note    Addendum   Date of Service: 12/20/2023 11:06 AM  Related encounter: Admission (Current) from 12/14/2023 in [facility]   Expand All Collapse All           Physical Medicine and Rehabilitation Consult Reason for Consult: Evaluate appropriateness for Inpatient Rehab Referring Physician: Dr. Farrel Hones       HPI: Dana [name] is a 63 y.o. female with PMHx of  has a past medical history of Acquired absence of left great toe (HCC), Acquired absence of other left toe(s) [facility]), ACS (acute coronary syndrome) (HCC), Acute bronchitis due to COVID-19 virus (03/03/2023), Acute on chronic combined systolic and diastolic CHF (congestive heart failure) (HCC) (09/16/2023), Acute respiratory failure with hypoxia (HCC) (07/20/2023), Anemia, Anoxic brain injury (HCC), Anxiety, CAD (coronary artery disease), Chronic diastolic (congestive) heart failure (HCC), Chronic pain, Chronic systolic (congestive) heart failure (HCC), CKD (chronic kidney disease), Contrast dye induced nephropathy, possible (09/03/2020), COPD (chronic obstructive pulmonary disease) (HCC), COVID-19 virus infection (03/03/2023), Demand ischemia (HCC), Diabetes (HCC)type 1, Diastolic CHF (HCC), Dyspnea, Encounter for screening mammogram for malignant neoplasm of breast (09/16/2023), Family history of breast cancer (10/23/2021), Gastro-esophageal reflux disease without esophagitis, Hyperlipidemia, Hypertension, Hypertensive heart disease with heart failure (HCC), Hypoxemia, Idiopathic gout, multiple sites, Leukocytosis (03/29/2022), Localized edema, Low back pain, Mitral regurgitation, Mitral regurgitation, Mixed hyperlipidemia, Mixed incontinence, Morbid (severe) obesity due to excess calories (HCC), Neuromuscular disorder (HCC), Neuropathy, Non-ST elevation (NSTEMI) myocardial infarction (HCC), OSA (obstructive sleep apnea)  (09/03/2020), Other specified hypothyroidism, PAF (paroxysmal atrial fibrillation) (HCC), PEA (Pulseless electrical activity) (HCC), Pneumonia, Primary insomnia, Rheumatoid arthritis (HCC), Stroke (HCC), Thyroid  disease, Type 1 diabetes mellitus (HCC), URI with cough and congestion (08/22/2023), and Vitamin D  deficiency. . They were admitted to [facility] on 12/14/2023 from [facility] after presenting to the ED 5-29 for fevers, dysuria, and flank pain.  She was started on IV fluids and broad-spectrum antibiotics for suspected UTI with CT abdomen and pelvis showing bladder wall thickening on CT, and shortly after was noted to have purulent drainage from the left heel--wound cultures grew MRSA.  She was transferred to [facility], Dr. Julio Ohm consulted and recommended left BKA due to complete collapse of the left ankle after failed fusion, with cellulitis and ulceration in the setting of PVD and uncontrolled diabetes.  She underwent left BKA on 6/4.  Hospitalization was complicated by severe sepsis secondary to postop left ankle infection on IV vancomycin , Ancef  for E. coli pyelonephritis, acute on chronic HFrEF on diuretics, CKD stage IV, anemia, uncontrolled diabetes with A1c 10.2, severe depression and anxiety followed by psychiatry, and poor postop pain control managed with p.o. oxycodone  and IV Dilaudid .  PM&R was consulted to evaluate appropriateness for IPR admission.    Per chart review, she is living in a two-story home with first-floor set up with a ramped entrance.  She has 24-7 assistance available with her spouse and mom in the home.  At baseline, she uses a knee scooter and bedside commode, needs assistance for lower body dressing but typically manages ADLs and light IADLs.  She has a manual wheelchair in the home, but does not use it because it does not fit into their bathroom.  Currently, she is mod a x 2 for lateral scoot transfers, able to stand from edge of bed with mod assist  +2 for support with rolling walker.  Unable  to pivot due to weakness.  She is set up assist for upper body ADLs max assist +2 for lower body ADLs completed in standing.     Review of Systems  Constitutional:  Negative for chills and fever.  Respiratory:  Negative for shortness of breath and wheezing.   Cardiovascular:  Positive for leg swelling. Negative for chest pain.  Gastrointestinal:  Positive for diarrhea and nausea. Negative for abdominal pain, constipation and vomiting.       Incontinence  Genitourinary:        Foley  Musculoskeletal:  Positive for back pain.  Neurological:  Positive for tingling and weakness. Negative for headaches.  Psychiatric/Behavioral:  Positive for depression. The patient is nervous/anxious. The patient does not have insomnia.         Past Medical History:  Diagnosis Date   Acquired absence of left great toe (HCC)     Acquired absence of other left toe(s) (HCC)     ACS (acute coronary syndrome) (HCC)     Acute bronchitis due to COVID-19 virus 03/03/2023   Acute on chronic combined systolic and diastolic CHF (congestive heart failure) (HCC) 09/16/2023   Acute respiratory failure with hypoxia (HCC) 07/20/2023   Anemia     Anoxic brain injury (HCC)     Anxiety     CAD (coronary artery disease)      DES mLAD 04/07/15; DES dRCA & DES pPDA 08/30/16; DES mCX & PTCA OM1 09/29/20   Chronic diastolic (congestive) heart failure (HCC)     Chronic pain     Chronic systolic (congestive) heart failure (HCC)     CKD (chronic kidney disease)     Contrast dye induced nephropathy, possible 09/03/2020   COPD (chronic obstructive pulmonary disease) (HCC)     COVID-19 virus infection 03/03/2023   Demand ischemia (HCC)     Diabetes (HCC)type 1     Diastolic CHF (HCC)     Dyspnea     Encounter for screening mammogram for malignant neoplasm of breast 09/16/2023   Family history of breast cancer 10/23/2021   Gastro-esophageal reflux disease without esophagitis      Hyperlipidemia     Hypertension     Hypertensive heart disease with heart failure (HCC)     Hypoxemia     Idiopathic gout, multiple sites     Leukocytosis 03/29/2022   Localized edema     Low back pain     Mitral regurgitation     Mitral regurgitation     Mixed hyperlipidemia     Mixed incontinence     Morbid (severe) obesity due to excess calories (HCC)     Neuromuscular disorder (HCC)     Neuropathy     Non-ST elevation (NSTEMI) myocardial infarction (HCC)      08/29/20, 09/29/20   OSA (obstructive sleep apnea) 09/03/2020   Other specified hypothyroidism     PAF (paroxysmal atrial fibrillation) (HCC)      s/p pulmonary vein isolation by cryoablation 10/04/15 Dr. Jearldine Mina in [facility]   PEA (Pulseless electrical activity) [facility])      ~ 01/13/21 at [facility] during admission DKA, volume overload, possible CAP, hypoxia s/p ACLS with ROSC ~ 10-15   Pneumonia     Primary insomnia     Rheumatoid arthritis (HCC)     Stroke (HCC)     Thyroid  disease     Type 1 diabetes mellitus (HCC)     URI with cough and congestion 08/22/2023   Vitamin D  deficiency  Past Surgical History:  Procedure Laterality Date   ABDOMINAL HYSTERECTOMY        Partial- Still has both ovaries   AMPUTATION Left 12/18/2023    Procedure: AMPUTATION BELOW KNEE;  Surgeon: Timothy Ford, MD;  Location: [facility] OR;  Service: Orthopedics;  Laterality: Left;   ANKLE FUSION Left 11/13/2023    Procedure: HINDFOOT FUSION FIXATION OF ANKLE;  Surgeon: Laneta Pintos, MD;  Location: [facility] OR;  Service: Orthopedics;  Laterality: Left;   CARDIOVASCULAR STRESS TEST   08/13/2014    Nuclear; Normal   CARPAL TUNNEL RELEASE       CATARACT EXTRACTION, BILATERAL       CESAREAN SECTION       CHOLECYSTECTOMY       CORONARY ANGIOPLASTY WITH STENT PLACEMENT        3 blockages/ 1 stent   CORONARY BALLOON ANGIOPLASTY N/A 07/18/2023    Procedure: CORONARY BALLOON ANGIOPLASTY;  Surgeon: Odie Benne, MD;  Location: [facility]  INVASIVE CV LAB;  Service: Cardiovascular;  Laterality: N/A;   CORONARY PRESSURE/FFR STUDY N/A 09/07/2022    Procedure: INTRAVASCULAR PRESSURE WIRE/FFR STUDY;  Surgeon: Swaziland, Peter M, MD;  Location: [facility] INVASIVE CV LAB;  Service: Cardiovascular;  Laterality: N/A;   CORONARY STENT INTERVENTION N/A 09/29/2020    Procedure: CORONARY STENT INTERVENTION;  Surgeon: Arnoldo Lapping, MD;  Location: [facility] INVASIVE CV LAB;  Service: Cardiovascular;  Laterality: N/A;  CFX   CORONARY STENT INTERVENTION N/A 09/07/2022    Procedure: CORONARY STENT INTERVENTION;  Surgeon: Swaziland, Peter M, MD;  Location: [facility] INVASIVE CV LAB;  Service: Cardiovascular;  Laterality: N/A;   CORONARY STENT INTERVENTION N/A 02/13/2023    Procedure: CORONARY STENT INTERVENTION;  Surgeon: Odie Benne, MD;  Location: [facility] INVASIVE CV LAB;  Service: Cardiovascular;  Laterality: N/A;  LAD   CORONARY ULTRASOUND/IVUS N/A 02/13/2023    Procedure: Coronary Ultrasound/IVUS;  Surgeon: Odie Benne, MD;  Location: [facility] INVASIVE CV LAB;  Service: Cardiovascular;  Laterality: N/A;   CORONARY/GRAFT ACUTE MI REVASCULARIZATION N/A 12/08/2022    Procedure: Coronary/Graft Acute MI Revascularization;  Surgeon: Arleen Lacer, MD;  Location: [facility] INVASIVE CV LAB;  Service: Cardiovascular;  Laterality: N/A;   EYE SURGERY       LEFT HEART CATH AND CORONARY ANGIOGRAPHY N/A 09/29/2020    Procedure: LEFT HEART CATH AND CORONARY ANGIOGRAPHY;  Surgeon: Arnoldo Lapping, MD;  Location: [facility] INVASIVE CV LAB;  Service: Cardiovascular;  Laterality: N/A;   LEFT HEART CATH AND CORONARY ANGIOGRAPHY N/A 09/07/2022    Procedure: LEFT HEART CATH AND CORONARY ANGIOGRAPHY;  Surgeon: Swaziland, Peter M, MD;  Location: [facility] INVASIVE CV LAB;  Service: Cardiovascular;  Laterality: N/A;   LEFT HEART CATH AND CORONARY ANGIOGRAPHY N/A 12/08/2022    Procedure: LEFT HEART CATH AND CORONARY ANGIOGRAPHY;  Surgeon: Arleen Lacer, MD;  Location: [facility] INVASIVE CV LAB;  Service:  Cardiovascular;  Laterality: N/A;   LEFT HEART CATH AND CORONARY ANGIOGRAPHY N/A 02/13/2023    Procedure: LEFT HEART CATH AND CORONARY ANGIOGRAPHY;  Surgeon: Odie Benne, MD;  Location: [facility] INVASIVE CV LAB;  Service: Cardiovascular;  Laterality: N/A;   LEFT HEART CATH AND CORONARY ANGIOGRAPHY N/A 07/16/2023    Procedure: LEFT HEART CATH AND CORONARY ANGIOGRAPHY;  Surgeon: Odie Benne, MD;  Location: [facility] INVASIVE CV LAB;  Service: Cardiovascular;  Laterality: N/A;   RIGHT HEART CATH N/A 04/27/2021    Procedure: RIGHT HEART CATH;  Surgeon: Mardell Shade, MD;  Location: [facility] INVASIVE CV LAB;  Service: Cardiovascular;  Laterality: N/A;   RIGHT/LEFT HEART CATH AND CORONARY ANGIOGRAPHY N/A 01/07/2017    Procedure: Right/Left Heart Cath and Coronary Angiography;  Surgeon: Darlis Eisenmenger, MD;  Location: [facility] INVASIVE CV LAB;  Service: Cardiovascular;  Laterality: N/A;   ROTATOR CUFF REPAIR       TEE WITHOUT CARDIOVERSION N/A 04/28/2021    Procedure: TRANSESOPHAGEAL ECHOCARDIOGRAM (TEE);  Surgeon: Mardell Shade, MD;  Location: [facility] ENDOSCOPY;  Service: Cardiovascular;  Laterality: N/A;   TEE WITHOUT CARDIOVERSION N/A 05/18/2021    Procedure: TRANSESOPHAGEAL ECHOCARDIOGRAM (TEE);  Surgeon: Thukkani, Arun K, MD;  Location: [facility] INVASIVE CV LAB;  Service: Cardiovascular;  Laterality: N/A;   TOE AMPUTATION Left     TRANSCATHETER MITRAL EDGE TO EDGE REPAIR N/A 05/18/2021    Procedure: MITRAL VALVE REPAIR;  Surgeon: Kyra Phy, MD;  Location: [facility] INVASIVE CV LAB;  Service: Cardiovascular;  Laterality: N/A;   US  ECHOCARDIOGRAPHY   05/2017    Normal             Family History  Problem Relation Age of Onset   Breast cancer Mother 64        contralateral breast ca dx 67   Coronary artery disease Mother     Hypertension Mother     Hyperlipidemia Mother     Diabetes type II Mother     Lung cancer Mother 39   Diabetes Father     Hypertension Father     Mental illness  Sister     Bipolar disorder Other     Depression Other     Schizophrenia Other     Cancer Other          MGF's sisters x2; unknown type        Social History:  reports that she quit smoking about 41 years ago. Her smoking use included cigarettes. She started smoking about 42 years ago. She has been exposed to tobacco smoke. She has never used smokeless tobacco. She reports that she does not drink alcohol and does not use drugs. Allergies:  Allergies       Allergies  Allergen Reactions   Naproxen Hives and Rash   Shellfish-Derived Products Hives, Swelling and Other (See Comments)      Angioedema     Strawberry (Diagnostic) Hives   Celecoxib Other (See Comments)      Stomach pain   Doxepin  Hcl Other (See Comments)      Lethargic, sleeping a lot per pt and family   Glucosamine Hives, Swelling and Other (See Comments)      Angioedema     Iron  Hives and Other (See Comments)      IV iron  transfusion - hives - Feb 2022 hospitalization   Patient said she CAN tolerate if given Benadryl  first   Metoclopramide Other (See Comments)      Facial twitching and stuttering   Metolazone  Other (See Comments)      Acute renal failure   Statins Other (See Comments)      Muscle pain            Medications Prior to Admission  Medication Sig Dispense Refill   acetaminophen  (TYLENOL ) 500 MG tablet Take 1,000 mg by mouth every 6 (six) hours as needed for mild pain (pain score 1-3) or headache.       allopurinol  (ZYLOPRIM ) 100 MG tablet Take 0.5 tablets (50 mg total) by mouth daily. 90 tablet 1   aspirin  EC 81 MG tablet Take 1 tablet (81  mg total) by mouth daily. Swallow whole. 30 tablet 12   busPIRone  (BUSPAR ) 15 MG tablet Take 15 mg by mouth 3 (three) times daily.       Continuous Glucose Sensor (FREESTYLE LIBRE 2 SENSOR) MISC Inject 1 device into skin every 14 (fourteen) days. 2 each 2   EPINEPHrine  0.3 mg/0.3 mL IJ SOAJ injection Use as directed for life-threatening allergic reaction. 4 each  3   Evolocumab  (REPATHA  SURECLICK) 140 MG/ML SOAJ Inject 140 mg into the skin every 14 (fourteen) days. 6 mL 1   ezetimibe  (ZETIA ) 10 MG tablet Take 1 tablet (10 mg total) by mouth every evening. 90 tablet 1   gabapentin  (NEURONTIN ) 300 MG capsule Take 1 capsule (300 mg total) by mouth 3 (three) times daily. 90 capsule 1   Glucagon  (GVOKE HYPOPEN  2-PACK) 0.5 MG/0.1ML SOAJ Inject 0.5 mg into the skin daily as needed. (Patient taking differently: Inject 0.5 mg into the skin daily as needed (hypoglycemia).) 0.2 mL 5   HYDROcodone -acetaminophen  (NORCO) 7.5-325 MG tablet Take 1 tablet by mouth 3 (three) times daily as needed. 90 tablet 0   insulin  aspart (NOVOLOG  FLEXPEN) 100 UNIT/ML FlexPen Inject 2 - 10 Units before meals based on SSI. 15 mL 3   insulin  glargine (LANTUS  SOLOSTAR) 100 UNIT/ML Solostar Pen Inject 45 Units into the skin daily. (Patient taking differently: Inject 30 Units into the skin daily.)       Insulin  Pen Needle (BD PEN NEEDLE NANO 2ND GEN) 32G X 4 MM MISC Inject 1 each into the skin in the morning, at noon, in the evening, and at bedtime. 600 each 3   latanoprost  (XALATAN ) 0.005 % ophthalmic solution Place 1 drop into both eyes at bedtime. 2.5 mL 0   linaclotide  (LINZESS ) 145 MCG CAPS capsule Take 1 capsule (145 mcg total) by mouth daily before breakfast. (Patient taking differently: Take 145 mcg by mouth as needed (constipation).) 90 capsule 1   LORazepam  (ATIVAN ) 0.5 MG tablet Take 1 tablet (0.5 mg total) by mouth daily as needed for anxiety. 90 tablet 1   magnesium  oxide (MAG-OX) 400 MG tablet Take 2 tablets (800 mg total) by mouth daily. Take 2 (400 mg) daily=800 mg 180 tablet 1   Menthol -Methyl Salicylate  (ICY HOT EXTRA STRENGTH) 10-30 % CREA Apply 1 Application topically as needed (bilateral hips and knees). 85 g 1   nitroGLYCERIN  (NITROSTAT ) 0.4 MG SL tablet DISSOLVE ONE TABLET UNDER THE TONGUE EVERY 5 MINUTES AS NEEDED FOR CHEST PAIN.  DO NOT EXCEED A TOTAL OF 3 DOSES IN 15  MINUTES 50 tablet 3   Omega-3 Fatty Acids (FISH OIL PO) Take 1 capsule by mouth daily.       OXYGEN  Inhale 2 L/min into the lungs at bedtime.       pantoprazole  (PROTONIX ) 40 MG tablet Take 1 tablet (40 mg total) by mouth daily. 90 tablet 1   potassium chloride  SA (KLOR-CON  M) 20 MEQ tablet Take 1 tablet (20 mEq total) by mouth daily.       Probiotic CAPS Take 1 capsule by mouth daily.       promethazine  (PHENERGAN ) 25 MG tablet Take 1 tablet (25 mg total) by mouth daily as needed. 90 tablet 1   ticagrelor  (BRILINTA ) 90 MG TABS tablet Take 1 tablet (90 mg total) by mouth 2 (two) times daily. 180 tablet 1   torsemide  (DEMADEX ) 20 MG tablet Take 3 tablets (60 mg total) by mouth daily. 90 tablet 3   vitamin D3 (CHOLECALCIFEROL ) 25  MCG tablet Take 2 tablets (2,000 Units total) by mouth 2 (two) times daily. 120 tablet 2   doxepin  (SINEQUAN ) 150 MG capsule Take 1 capsule (150 mg total) by mouth at bedtime. (Patient not taking: Reported on 12/14/2023) 30 capsule 3   methocarbamol  (ROBAXIN ) 500 MG tablet Take 1 tablet (500 mg total) by mouth every 6 (six) hours as needed for muscle spasms. (Patient not taking: Reported on 12/14/2023) 28 tablet 0   orphenadrine  (NORFLEX ) 100 MG tablet Take 1 tablet (100 mg total) by mouth 2 (two) times daily. (Patient not taking: Reported on 12/14/2023) 60 tablet 0          Home: Home Living Family/patient expects to be discharged to:: Private residence Living Arrangements: Spouse/significant other, Parent Available Help at Discharge: Family, Available 24 hours/day Type of Home: House Home Access: Ramped entrance Home Layout: Able to live on main level with bedroom/bathroom Bathroom Shower/Tub: Health visitor: Standard Home Equipment: Agricultural consultant (2 wheels), Shower seat, Grab bars - tub/shower, Hand held shower head, Cane - single point, Wheelchair - manual, BSC/3in1, Rollator (4 wheels)  Functional History: Prior Function Prior Level of Function  : Needs assist, History of Falls (last six months) Mobility Comments: Transfers to [facility], onto knee scooter. Not using W/c in house ADLs Comments: has been using BSC since ankle injury, needing assist for LB dressing since ankle injury but typically manages ADls and light IADLs Functional Status:  Mobility: Bed Mobility Overal bed mobility: Needs Assistance Bed Mobility: Supine to Sit Supine to sit: Mod assist, HOB elevated, Used rails Sit to supine: Contact guard assist General bed mobility comments: pt able to return to supine and with bed in [place] pt is able to pull herself up in the bed Transfers Overall transfer level: Needs assistance Equipment used: Rolling walker (2 wheels), Sliding board Transfers: Bed to chair/wheelchair/BSC Sit to Stand: +2 physical assistance, +2 safety/equipment, Mod assist Bed to/from chair/wheelchair/BSC transfer type:: Lateral/scoot transfer Stand pivot transfers: Total assist  Lateral/Scoot Transfers: +2 safety/equipment, With slide board, +2 physical assistance, Mod assist General transfer comment: with modAx2 and use of bed pad pt is able to scoot to her L where the drop arm of the recliner is, from chair to bed with modAx2 and use of bed pad to assist in elevating her hips, cues for hand placement throughout   ADL: ADL Overall ADL's : Needs assistance/impaired Eating/Feeding: Independent Grooming: Set up, Sitting Upper Body Bathing: Set up, Sitting Lower Body Bathing: Maximal assistance, +2 for safety/equipment, +2 for physical assistance, Sit to/from stand Upper Body Dressing : Set up, Sitting Lower Body Dressing: Maximal assistance, +2 for physical assistance, +2 for safety/equipment, Sit to/from stand Toileting- Clothing Manipulation and Hygiene: Total assistance, Bed level   Cognition: Cognition Orientation Level: Oriented X4 Cognition Arousal: Alert Behavior During Therapy: WFL for tasks assessed/performed   Blood pressure 129/61,  pulse 68, temperature 98 F (36.7 C), temperature source Oral, resp. rate 16, height 5' 5 (1.651 m), weight 98 kg, SpO2 97%. Physical Exam   PE: Constitution: Appropriate appearance for age. No apparent distress.  Resp: No respiratory distress. No accessory muscle usage. on RA and CTAB Cardio: Well perfused appearance.  1+ right lower extremity edema. Abdomen: Nondistended. Nontender.  Positive bowel sounds, normoactive. Psych: Appropriate mood and affect. Neuro: AAOx4. No apparent cognitive deficits    Neurologic Exam:   DTRs: Reflexes were 2+ in bilateral patella, biceps, BR and triceps. Babinsky: flexor responses right Hoffmans: negative b/l Sensory exam: revealed stocking  glove pattern peripheral neuropathy extending similarly to the knees bilaterally and the elbows bilaterally Motor exam: strength 5/5 throughout bilateral upper extremities and bilateral lower extremities Coordination: Fine motor coordination was normal.       Lab Results Last 24 Hours       Results for orders placed or performed during the hospital encounter of 12/14/23 (from the past 24 hours)  Glucose, capillary     Status: Abnormal    Collection Time: 12/19/23 12:01 PM  Result Value Ref Range    Glucose-Capillary 221 (H) 70 - 99 mg/dL  Glucose, capillary     Status: Abnormal    Collection Time: 12/19/23  4:24 PM  Result Value Ref Range    Glucose-Capillary 199 (H) 70 - 99 mg/dL  Glucose, capillary     Status: Abnormal    Collection Time: 12/19/23  7:41 PM  Result Value Ref Range    Glucose-Capillary 165 (H) 70 - 99 mg/dL  Glucose, capillary     Status: Abnormal    Collection Time: 12/19/23 11:40 PM  Result Value Ref Range    Glucose-Capillary 42 (LL) 70 - 99 mg/dL    Comment 1 Document in Chart    Glucose, capillary     Status: Abnormal    Collection Time: 12/19/23 11:56 PM  Result Value Ref Range    Glucose-Capillary 58 (L) 70 - 99 mg/dL  Glucose, capillary     Status: Abnormal    Collection  Time: 12/20/23 12:19 AM  Result Value Ref Range    Glucose-Capillary 66 (L) 70 - 99 mg/dL  Glucose, capillary     Status: None    Collection Time: 12/20/23 12:41 AM  Result Value Ref Range    Glucose-Capillary 74 70 - 99 mg/dL  Glucose, capillary     Status: Abnormal    Collection Time: 12/20/23  3:42 AM  Result Value Ref Range    Glucose-Capillary 131 (H) 70 - 99 mg/dL  Basic metabolic panel     Status: Abnormal    Collection Time: 12/20/23  5:09 AM  Result Value Ref Range    Sodium 132 (L) 135 - 145 mmol/L    Potassium 4.5 3.5 - 5.1 mmol/L    Chloride 96 (L) 98 - 111 mmol/L    CO2 27 22 - 32 mmol/L    Glucose, Bld 128 (H) 70 - 99 mg/dL    BUN 60 (H) 6 - 20 mg/dL    Creatinine, Ser 2.13 (H) 0.44 - 1.00 mg/dL    Calcium  8.2 (L) 8.9 - 10.3 mg/dL    GFR, Estimated 22 (L) >60 mL/min    Anion gap 9 5 - 15  CK     Status: Abnormal    Collection Time: 12/20/23  5:09 AM  Result Value Ref Range    Total CK 8 (L) 38 - 234 U/L  CBC     Status: Abnormal    Collection Time: 12/20/23  5:09 AM  Result Value Ref Range    WBC 14.5 (H) 4.0 - 10.5 K/uL    RBC 3.11 (L) 3.87 - 5.11 MIL/uL    Hemoglobin 8.4 (L) 12.0 - 15.0 g/dL    HCT 08.6 (L) 57.8 - 46.0 %    MCV 87.8 80.0 - 100.0 fL    MCH 27.0 26.0 - 34.0 pg    MCHC 30.8 30.0 - 36.0 g/dL    RDW 46.9 (H) 62.9 - 15.5 %    Platelets 297 150 - 400 K/uL  nRBC 0.0 0.0 - 0.2 %  Glucose, capillary     Status: Abnormal    Collection Time: 12/20/23  8:17 AM  Result Value Ref Range    Glucose-Capillary 158 (H) 70 - 99 mg/dL    *Note: Due to a large number of results and/or encounters for the requested time period, some results have not been displayed. A complete set of results can be found in Results Review.      Imaging Results (Last 48 hours)  No results found.     Assessment/Plan: Diagnosis: Left BKA Does the need for close, 24 hr/day medical supervision in concert with the patient's rehab needs make it unreasonable for this patient to  be served in a less intensive setting? Yes Co-Morbidities requiring supervision/potential complications:  postop left ankle infection on IV vancomycin , Ancef  for E. coli pyelonephritis, acute on chronic HFrEF on diuretics, CKD stage IV, anemia, uncontrolled diabetes with A1c 10.2, severe depression and anxiety followed by psychiatry, and poor postop pain control managed with p.o. oxycodone  and IV Dilaudid , bowel incontinence, and urinary retention on Foley catheter. Due to bladder management, bowel management, safety, skin/wound care, disease management, medication administration, pain management, and patient education, does the patient require 24 hr/day rehab nursing? Yes Does the patient require coordinated care of a physician, rehab nurse, therapy disciplines of PT, OT to address physical and functional deficits in the context of the above medical diagnosis(es)? Yes Addressing deficits in the following areas: balance, endurance, locomotion, strength, transferring, bowel/bladder control, bathing, dressing, feeding, grooming, and toileting Can the patient actively participate in an intensive therapy program of at least 3 hrs of therapy per day at least 5 days per week? Yes The potential for patient to make measurable gains while on inpatient rehab is good Anticipated functional outcomes upon discharge from inpatient rehab are supervision  with PT, supervision with OT,  Estimated rehab length of stay to reach the above functional goals is: 5-7 days Anticipated discharge destination: Home Overall Rehab/Functional Prognosis: good   POST ACUTE RECOMMENDATIONS: This patient's condition is appropriate for continued rehabilitative care in the following setting: CIR Patient has agreed to participate in recommended program. Yes Note that insurance prior authorization may be required for reimbursement for recommended care.   Comment: Dana [name] is a 63 year old female with complex medical history presenting  for left BKA after developing postop infection of her right ankle.  She lives in a home with a level entrance in a wheelchair ramp, with a basement that she does not use.  She is the primary caregiver for her mother, and has a husband who is available with physical assistance.  She is motivated to return home with intensive therapies, but will need to work on independent transfers and ambulation, hopefully with a walker given her bathroom will not accommodate a standard wheelchair.  She is an appropriate candidate for a short inpatient rehab stay to work on these goals.   I have personally performed a face to face diagnostic evaluation of this patient. Additionally, I have examined the patient's medical record including any pertinent labs and radiographic images. If the physician assistant has documented in this note, I have reviewed and edited or otherwise concur with the physician assistant's documentation.   Thanks,   Bea Lime, DO 12/20/2023      Revision History  Routing History

## 2023-12-27 NOTE — Progress Notes (Signed)
 Rawland Caddy, MD  Physician Physical Medicine and Rehabilitation   PMR Pre-admission    Signed   Date of Service: 12/20/2023 11:12 AM  Related encounter: Admission (Current) from 12/14/2023 in [facility]   Signed     Expand All Collapse All  PMR Admission Coordinator Pre-Admission Assessment   Patient: Dana [name] is an 63 y.o., female MRN: [id number] DOB: December 26, 1960 Height: 5' 5 (165.1 cm) Weight: 98 kg (at [facility])                                                                                                                                                  Insurance Information HMO:     PPO: yes     PCP:      IPA:      80/20:      OTHER:  PRIMARY: [name]      Policy#: [id number]      Subscriber: pt CM Name: Tex Filbert      Phone#: [phone]     Fax#: epic access Pre-Cert#: [id number] auth for CIR provided by Tex Filbert with [employer] with updates due weekly.  They have epic acces      Employer:  Benefits:  Phone #: [phone]     Name:  Eff. Date: 07/17/23     Deduct: $0      Out of Pocket Max: $3500 (met [phone])      Life Max: n/a  CIR: $225/day for days 1-6      SNF: 20 full days Outpatient:      Co-Pay: $15/visit Home Health: 80%      Co-Pay: 20% DME: 80%     Co-Pay: 20% Providers:  SECONDARY:       Policy#:       Phone#:    Artist:       Phone#:    The Engineer, materials Information Summary" for patients in Inpatient Rehabilitation Facilities with attached "Privacy Act Statement-Health Care Records" was provided and verbally reviewed with: Patient and Family   Emergency Contact Information Contact Information       Name Relation Home Work Mobile    Forbis,[name] Spouse [phone]   [phone]         Other Contacts       Name Relation Home Work Mobile    Petros Daughter     [phone]         Current Medical History  Patient Admitting Diagnosis: BKA    History of Present  Illness: Pt is a 63 y/o female with PMH of DM, CKD stage IV, CAD, chronic hypoxic respiratory failure, OSA, HFmrEF, anxiety, depression, chronic pain, and left trimalleolar fx in April 2025 s/p ORIF who was admitted to [facility] on 12/14/23 with fevers, dysuria, and flank pain.  In ED pt febrile  with leukocytosis of 24.7.  CT abdomen/pelvis revealed bladder wall thickening with surround fat stranding.  She had purulent drainage from left heel wound as well as open wounds in the medial aspect of the left ankle.  Blood cultures demonstrated MRSA bacteremia with e-coli.  Ortho was consulted and recommended further imaging which demonstrated charcot collapse with failed internal fixation of fracture with cellulitis and ulceration of left heel in conjunction with PVD and DM.  Recommendations were for BKA which pt underwent per Dr. Julio Ohm on 12/18/23.  Post operative course management of pain, DM.  Cardiology planning TEE to r/o endocarditis.  Therapy evaluations completed and pt was recommended for CIR to return to intermittent mod I level in preparation for d/c home with family support.  Glasgow Coma Scale Score: 15   Patient's medical record from [facility] has been reviewed by the rehabilitation admission coordinator and physician.   Past Medical History      Past Medical History:  Diagnosis Date   Acquired absence of left great toe (HCC)     Acquired absence of other left toe(s) (HCC)     ACS (acute coronary syndrome) (HCC)     Acute bronchitis due to COVID-19 virus 03/03/2023   Acute on chronic combined systolic and diastolic CHF (congestive heart failure) (HCC) 09/16/2023   Acute respiratory failure with hypoxia (HCC) 07/20/2023   Anemia     Anoxic brain injury (HCC)     Anxiety     CAD (coronary artery disease)      DES mLAD 04/07/15; DES dRCA & DES pPDA 08/30/16; DES mCX & PTCA OM1 09/29/20   Chronic diastolic (congestive) heart failure (HCC)     Chronic pain     Chronic systolic (congestive) heart  failure (HCC)     CKD (chronic kidney disease)     Contrast dye induced nephropathy, possible 09/03/2020   COPD (chronic obstructive pulmonary disease) (HCC)     COVID-19 virus infection 03/03/2023   Demand ischemia (HCC)     Diabetes (HCC)type 1     Diastolic CHF (HCC)     Dyspnea     Encounter for screening mammogram for malignant neoplasm of breast 09/16/2023   Family history of breast cancer 10/23/2021   Gastro-esophageal reflux disease without esophagitis     Hyperlipidemia     Hypertension     Hypertensive heart disease with heart failure (HCC)     Hypoxemia     Idiopathic gout, multiple sites     Leukocytosis 03/29/2022   Localized edema     Low back pain     Mitral regurgitation     Mitral regurgitation     Mixed hyperlipidemia     Mixed incontinence     Morbid (severe) obesity due to excess calories (HCC)     Neuromuscular disorder (HCC)     Neuropathy     Non-ST elevation (NSTEMI) myocardial infarction (HCC)      08/29/20, 09/29/20   OSA (obstructive sleep apnea) 09/03/2020   Other specified hypothyroidism     PAF (paroxysmal atrial fibrillation) (HCC)      s/p pulmonary vein isolation by cryoablation 10/04/15 Dr. Jearldine Mina in [facility]   PEA (Pulseless electrical activity) [facility])      ~ 01/13/21 at [facility] during admission DKA, volume overload, possible CAP, hypoxia s/p ACLS with ROSC ~ 10-15   Pneumonia     Primary insomnia     Rheumatoid arthritis (HCC)     Stroke (HCC)  Thyroid  disease     Type 1 diabetes mellitus (HCC)     URI with cough and congestion 08/22/2023   Vitamin D  deficiency            Has the patient had major surgery during 100 days prior to admission? Yes   Family History  family history includes Bipolar disorder in an other family member; Breast cancer (age of onset: 84) in her mother; Cancer in an other family member; Coronary artery disease in her mother; Depression in an other family member; Diabetes in her father; Diabetes type II  in her mother; Hyperlipidemia in her mother; Hypertension in her father and mother; Lung cancer (age of onset: 76) in her mother; Mental illness in her sister; Schizophrenia in an other family member.     Current Medications   Current Medications    Current Facility-Administered Medications:    acetaminophen  (TYLENOL ) tablet 650 mg, 650 mg, Oral, Q6H PRN, 650 mg at 12/17/23 2341 **OR** acetaminophen  (TYLENOL ) suppository 650 mg, 650 mg, Rectal, Q6H PRN, Opyd, Santana Cue, MD   allopurinol  (ZYLOPRIM ) tablet 50 mg, 50 mg, Oral, Daily, Opyd, Timothy S, MD, 50 mg at 12/20/23 1043   aspirin  EC tablet 81 mg, 81 mg, Oral, Daily, Elgergawy, Dawood S, MD, 81 mg at 12/20/23 1043   carvedilol  (COREG ) tablet 6.25 mg, 6.25 mg, Oral, BID WC, Elgergawy, Dawood S, MD, 6.25 mg at 12/20/23 7829   Chlorhexidine  Gluconate Cloth 2 % PADS 6 each, 6 each, Topical, Daily, Elgergawy, Ardia Kraft, MD, 6 each at 12/20/23 1041   DAPTOmycin  (CUBICIN ) IVPB 700 mg/100mL premix, 700 mg, Intravenous, Q48H, Unk Garb, DO   escitalopram  (LEXAPRO ) tablet 10 mg, 10 mg, Oral, Daily, Elgergawy, Dawood S, MD, 10 mg at 12/20/23 1042   [START ON 12/21/2023] furosemide  (LASIX ) injection 60 mg, 60 mg, Intravenous, Daily, Duke, Soyla Bainter Haber, PA   gabapentin  (NEURONTIN ) capsule 300 mg, 300 mg, Oral, TID, Opyd, Timothy S, MD, 300 mg at 12/20/23 1042   heparin  injection 5,000 Units, 5,000 Units, Subcutaneous, Q8H, Elgergawy, Ardia Kraft, MD, 5,000 Units at 12/20/23 5621   HYDROmorphone  (DILAUDID ) injection 0.5 mg, 0.5 mg, Intravenous, Q4H PRN, Elgergawy, Ardia Kraft, MD, 0.5 mg at 12/20/23 3086   insulin  aspart (novoLOG ) injection 0-6 Units, 0-6 Units, Subcutaneous, Q4H, Farrel Hones, Eric, DO   latanoprost  (XALATAN ) 0.005 % ophthalmic solution 1 drop, 1 drop, Both Eyes, QHS, Elgergawy, Ardia Kraft, MD, 1 drop at 12/19/23 2342   LORazepam  (ATIVAN ) tablet 0.5 mg, 0.5 mg, Oral, QID PRN, Elgergawy, Dawood S, MD, 0.5 mg at 12/19/23 2046   oxyCODONE  (Oxy  IR/ROXICODONE ) immediate release tablet 5 mg, 5 mg, Oral, Q4H PRN, Opyd, Timothy S, MD, 5 mg at 12/20/23 5784   pantoprazole  (PROTONIX ) EC tablet 40 mg, 40 mg, Oral, Daily, Opyd, Timothy S, MD, 40 mg at 12/20/23 1042   polyethylene glycol (MIRALAX  / GLYCOLAX ) packet 17 g, 17 g, Oral, Daily, Ghimire, Shanker M, MD   prochlorperazine  (COMPAZINE ) injection 5 mg, 5 mg, Intravenous, Q6H PRN, Opyd, Timothy S, MD, 5 mg at 12/18/23 0328   senna (SENOKOT) tablet 17.2 mg, 2 tablet, Oral, QHS, Ghimire, Shanker M, MD, 17.2 mg at 12/19/23 2047   sodium chloride  flush (NS) 0.9 % injection 10-40 mL, 10-40 mL, Intracatheter, PRN, Elgergawy, Ardia Kraft, MD   sodium chloride  flush (NS) 0.9 % injection 3 mL, 3 mL, Intravenous, Q12H, Opyd, Timothy S, MD, 3 mL at 12/20/23 1038   ticagrelor  (BRILINTA ) tablet 90 mg, 90 mg, Oral, BID, Ghimire, Shanker  M, MD, 90 mg at 12/20/23 1042     Patients Current Diet:  Diet Order                  Diet NPO time specified Except for: Sips with Meds  Diet effective midnight             Diet heart healthy/carb modified Fluid consistency: Thin  Diet effective now                         Precautions / Restrictions Precautions Precautions: Fall Precaution/Restrictions Comments: LLE would vac, L limb protector Restrictions Weight Bearing Restrictions Per Provider Order: Yes LLE Weight Bearing Per Provider Order: Non weight bearing Other Position/Activity Restrictions: pre-emptive for post-op BKA, no formal order yet.    Has the patient had 2 or more falls or a fall with injury in the past year?Yes   Prior Activity Level Limited Community (1-2x/wk): since trimal fx has been more limited in mobility, still mod I, driving, caregiver for mom but only for cooking/cleaning Community (5-7x/wk): independent prior to fall with trimal fx   Prior Functional Level Prior Function Prior Level of Function : Needs assist, History of Falls (last six months) Mobility Comments:  Transfers to [employer], onto knee scooter. Not using W/c in house ADLs Comments: has been using BSC since ankle injury, needing assist for LB dressing since ankle injury but typically manages ADls and light IADLs   Self Care: Did the patient need help bathing, dressing, using the toilet or eating?  Needed some help   Indoor Mobility: Did the patient need assistance with walking from room to room (with or without device)? Independent   Stairs: Did the patient need assistance with internal or external stairs (with or without device)? Independent   Functional Cognition: Did the patient need help planning regular tasks such as shopping or remembering to take medications? Independent   Patient Information Are you of Hispanic, Latino/a,or Spanish origin?: A. No, not of Hispanic, Latino/a, or Spanish origin What is your race?: A. White Do you need or want an interpreter to communicate with a doctor or health care staff?: 0. No   Patient's Response To:  Health Literacy and Transportation Is the patient able to respond to health literacy and transportation needs?: Yes Health Literacy - How often do you need to have someone help you when you read instructions, pamphlets, or other written material from your doctor or pharmacy?: Never In the past 12 months, has lack of transportation kept you from medical appointments or from getting medications?: No In the past 12 months, has lack of transportation kept you from meetings, work, or from getting things needed for daily living?: No   Home Assistive Devices / Equipment Home Equipment: Agricultural consultant (2 wheels), Shower seat, Grab bars - tub/shower, Higher education careers adviser held shower head, Cane - single point, Wheelchair - manual, BSC/3in1, Rollator (4 wheels)   Prior Device Use: Indicate devices/aids used by the patient prior to current illness, exacerbation or injury? Knee scooter   Current Functional Level Cognition   Orientation Level: Oriented X4    Extremity  Assessment (includes Sensation/Coordination)   Upper Extremity Assessment: Defer to OT evaluation  Lower Extremity Assessment: LLE deficits/detail, Generalized weakness LLE Deficits / Details: Lt BKA, vac in place, shrinker and ampushield on LLE: Unable to fully assess due to pain     ADLs   Overall ADL's : Needs assistance/impaired Eating/Feeding: Independent Grooming: Set up, Sitting Upper Body Bathing:  Set up, Sitting Lower Body Bathing: Maximal assistance, +2 for safety/equipment, +2 for physical assistance, Sit to/from stand Upper Body Dressing : Set up, Sitting Lower Body Dressing: Maximal assistance, +2 for physical assistance, +2 for safety/equipment, Sit to/from stand Toileting- Clothing Manipulation and Hygiene: Total assistance, Bed level     Mobility   Overal bed mobility: Needs Assistance Bed Mobility: Supine to Sit Supine to sit: Mod assist, HOB elevated, Used rails Sit to supine: Contact guard assist General bed mobility comments: pt able to return to supine and with bed in [place] pt is able to pull herself up in the bed     Transfers   Overall transfer level: Needs assistance Equipment used: Rolling walker (2 wheels), Sliding board Transfers: Bed to chair/wheelchair/BSC Sit to Stand: +2 physical assistance, +2 safety/equipment, Mod assist Bed to/from chair/wheelchair/BSC transfer type:: Lateral/scoot transfer Stand pivot transfers: Total assist  Lateral/Scoot Transfers: +2 safety/equipment, With slide board, +2 physical assistance, Mod assist General transfer comment: with modAx2 and use of bed pad pt is able to scoot to her L where the drop arm of the recliner is, from chair to bed with modAx2 and use of bed pad to assist in elevating her hips, cues for hand placement throughout     Ambulation / Gait / Stairs / Wheelchair Mobility         Posture / Balance Balance Overall balance assessment: Needs assistance Sitting-balance support: No upper extremity  supported, Feet supported Sitting balance-Leahy Scale: Good Standing balance support: Bilateral upper extremity supported, Reliant on assistive device for balance Standing balance-Leahy Scale: Poor     Special needs/care consideration CPAP, Wound Vac LLE, Skin L BKA incision, and Diabetic management yes        Previous Home Environment (from acute therapy documentation) Living Arrangements: Spouse/significant other, Parent Available Help at Discharge: Family, Available 24 hours/day Type of Home: House Home Layout: Able to live on main level with bedroom/bathroom Home Access: Ramped entrance Bathroom Shower/Tub: Health visitor: Standard Home Care Services: Yes Type of Home Care Services: Home OT, Home RN, Home PT Home Care Agency (if known): [facility]   Discharge Living Setting Plans for Discharge Living Setting: Patient's home, Lives with (comment) (mom and spouse) Type of Home at Discharge: House Discharge Home Layout: Able to live on main level with bedroom/bathroom Discharge Home Access: Ramped entrance Discharge Bathroom Shower/Tub: Walk-in shower Discharge Bathroom Toilet: Standard Discharge Bathroom Accessibility: Yes How Accessible: Accessible via walker Does the patient have any problems obtaining your medications?: No   Social/Family/Support Systems Anticipated Caregiver: pt's spouse and mom Anticipated Caregiver's Contact Information: Wandalee Gust  [phone] Ability/Limitations of Caregiver: Wandalee Gust works 2-3 days/week 12 hour shifts.  Mom is home during those times and is able to provide supervision, light assist for ADLs Caregiver Availability: 24/7 Discharge Plan Discussed with Primary Caregiver: Yes Is Caregiver In Agreement with Plan?: Yes Does Caregiver/Family have Issues with Lodging/Transportation while Pt is in Rehab?: No     Goals Patient/Family Goal for Rehab: PT/OT mod I w/c level, supervision ambulatory with RW, SLP n/a Expected length of stay:  10-14 days Additional Information: Discharge plan: to pt's home with her mom and her spouse.  Spouse works 2-3x/week 12 hr shifts, mom is home 24/7.  Pt is not a caregiver for her mother outside of light meal prep and cleaning.  Mom is independent and can provide supervision and light assist for some ADLs if needed Pt/Family Agrees to Admission and willing to participate: Yes Program Orientation Provided &  Reviewed with Pt/Caregiver Including Roles  & Responsibilities: Yes     Decrease burden of Care through IP rehab admission: no      Possible need for SNF placement upon discharge: No.  Plan for discharge to pt's home which she shares with her spouse and her mom.  Mom is home 24/7 and can provide supervision, limited assist with some ADLs. Spouse works 2-3x/week for 12 hour shifts.      Patient Condition: This patient's medical and functional status has changed since the consult dated: 12/20/23 in which the Rehabilitation Physician determined and documented that the patient's condition is appropriate for intensive rehabilitative care in an inpatient rehabilitation facility. See History of Present Illness (above) for medical update. Functional changes are: pt min assist overall for transfers, though did have a hard day with therapy on 6/11 and requiring significantly increased assist for standing transfers. Patient's medical and functional status update has been discussed with the Rehabilitation physician and patient remains appropriate for inpatient rehabilitation. Will admit to inpatient rehab today.   Preadmission Screen Completed By:  Mickey Alar, PT, 12/20/2023 11:12 AM ______________________________________________________________________   Discussed status with Dr. Rachel Budds on 12/27/23 at  9:57 AM  and received approval for admission today.   Admission Coordinator:  Nene Aranas E Tamyah Cutbirth, PT, DPT time 9:57 AM /Date06/13/25             Revision History

## 2023-12-27 NOTE — Progress Notes (Signed)
 Inpatient Rehabilitation Admission Medication Review by a Pharmacist  A complete drug regimen review was completed for this patient to identify any potential clinically significant medication issues.  High Risk Drug Classes Is patient taking? Indication by Medication  Antipsychotic No   Anticoagulant Yes Sq heparin  - VTE ppx  Antibiotic Yes IV daptomycin  - MRSA bacteremia - ends 6/14  Opioid Yes Oxycodone  prn pain  Antiplatelet Yes Aspirin , ticarelor - CAD, stent  Hypoglycemics/insulin  Yes Insulin  - DM  Vasoactive Medication Yes Carvedilol  - HTN  Chemotherapy No   Other Yes Allopurinol  - gout Buspirone  - anxiety Escitalopram  - mood Ezetimibe  - HLD Gabapentin  - pain Xalatan  - glaucoma Lorazepam  prn anxiety Pantoprazole  - Reflux      Type of Medication Issue Identified Description of Issue Recommendation(s)  Drug Interaction(s) (clinically significant)     Duplicate Therapy     Allergy      No Medication Administration End Date     Incorrect Dose     Additional Drug Therapy Needed     Significant med changes from prior encounter (inform family/care partners about these prior to discharge).    Other       Clinically significant medication issues were identified that warrant physician communication and completion of prescribed/recommended actions by midnight of the next day:  No  Name of provider notified for urgent issues identified:   Provider Method of Notification:     Pharmacist comments: None  Time spent performing this drug regimen review (minutes):  20 minutes  Thank you. Lennice Quivers, PharmD

## 2023-12-28 DIAGNOSIS — N184 Chronic kidney disease, stage 4 (severe): Secondary | ICD-10-CM | POA: Diagnosis not present

## 2023-12-28 DIAGNOSIS — Z89512 Acquired absence of left leg below knee: Secondary | ICD-10-CM | POA: Diagnosis not present

## 2023-12-28 DIAGNOSIS — D62 Acute posthemorrhagic anemia: Secondary | ICD-10-CM | POA: Diagnosis not present

## 2023-12-28 DIAGNOSIS — L03116 Cellulitis of left lower limb: Secondary | ICD-10-CM | POA: Diagnosis not present

## 2023-12-28 LAB — TYPE AND SCREEN
ABO/RH(D): A NEG
Antibody Screen: NEGATIVE
Unit division: 0

## 2023-12-28 LAB — BASIC METABOLIC PANEL WITH GFR
Anion gap: 11 (ref 5–15)
BUN: 58 mg/dL — ABNORMAL HIGH (ref 6–20)
CO2: 27 mmol/L (ref 22–32)
Calcium: 8.6 mg/dL — ABNORMAL LOW (ref 8.9–10.3)
Chloride: 96 mmol/L — ABNORMAL LOW (ref 98–111)
Creatinine, Ser: 2.81 mg/dL — ABNORMAL HIGH (ref 0.44–1.00)
GFR, Estimated: 19 mL/min — ABNORMAL LOW (ref >60)
Glucose, Bld: 195 mg/dL — ABNORMAL HIGH (ref 70–99)
Potassium: 4.6 mmol/L (ref 3.5–5.1)
Sodium: 134 mmol/L — ABNORMAL LOW (ref 135–145)

## 2023-12-28 LAB — BPAM RBC
ISSUE DATE / TIME: 202506131427
Unit Type and Rh: 202506262359
Unit Type and Rh: 600

## 2023-12-28 LAB — GLUCOSE, CAPILLARY
Glucose-Capillary: 170 mg/dL — ABNORMAL HIGH (ref 70–99)
Glucose-Capillary: 191 mg/dL — ABNORMAL HIGH (ref 70–99)
Glucose-Capillary: 120 mg/dL — ABNORMAL HIGH (ref 70–99)
Glucose-Capillary: 158 mg/dL — ABNORMAL HIGH (ref 70–99)

## 2023-12-28 NOTE — Progress Notes (Signed)
 PROGRESS NOTE   Subjective/Complaints: Had a reasonable night. Only issue is bruising/oozing from heparin  shots. Does report itching on stump  ROS: Patient denies fever, rash, sore throat, blurred vision, dizziness, nausea, vomiting, diarrhea, cough, shortness of breath or chest pain,   headache, or mood change.    Objective:   No results found. Recent Labs    12/27/23 0628 12/27/23 1829  WBC 10.1 11.9*  HGB 7.3* 8.1*  HCT 23.6* 26.3*  PLT 275 301   Recent Labs    12/27/23 0628 12/27/23 1829 12/28/23 0808  NA 136  --  134*  K 4.7  --  4.6  CL 98  --  96*  CO2 26  --  27  GLUCOSE 125*  --  195*  BUN 57*  --  58*  CREATININE 2.71* 2.92* 2.81*  CALCIUM  8.5*  --  8.6*    Intake/Output Summary (Last 24 hours) at 12/28/2023 0949 Last data filed at 12/28/2023 0535 Gross per 24 hour  Intake 120 ml  Output 1000 ml  Net -880 ml        Physical Exam: Vital Signs Blood pressure (!) 127/56, pulse 61, temperature 98.5 F (36.9 C), temperature source Oral, resp. rate 17, height 5' 5 (1.651 m), SpO2 98%.  General: Alert and oriented x 3, No apparent distress HEENT: Head is normocephalic, atraumatic, PERRLA, EOMI, sclera anicteric, oral mucosa pink and moist, dentition intact, ext ear canals clear,  Neck: Supple without JVD or lymphadenopathy Heart: Reg rate and rhythm. No murmurs rubs or gallops Chest: CTA bilaterally without wheezes, rales, or rhonchi; no distress Abdomen: Soft, non-tender, non-distended, bowel sounds positive. Extremities: No clubbing, cyanosis, stump edema. Pulses are 2+ Psych: Pt's affect is appropriate. Pt is cooperative Skin: Clean and intact without signs of breakdown, bruising/sl drainage from heparin  shot sites Neuro:  Alert and oriented x 3. Normal insight and awareness. Intact Memory. Normal language and speech. Cranial nerve exam unremarkable. MMT: BUE 4+ to 5/5. LLE limited by vac, pain,  at least 3 to 3+/5. RLE 4 to 4+/5. Sensory exam normal for light touch and pain in all 4 limbs. No limb ataxia or cerebellar signs. No abnormal tone appreciated.  .   Musculoskeletal: Left leg still swollen, shrinker in place, vac. Tender to palpation    Assessment/Plan: 1. Functional deficits which require 3+ hours per day of interdisciplinary therapy in a comprehensive inpatient rehab setting. Physiatrist is providing close team supervision and 24 hour management of active medical problems listed below. Physiatrist and rehab team continue to assess barriers to discharge/monitor patient progress toward functional and medical goals  Care Tool:  Bathing              Bathing assist       Upper Body Dressing/Undressing Upper body dressing        Upper body assist      Lower Body Dressing/Undressing Lower body dressing            Lower body assist       Toileting Toileting    Toileting assist       Transfers Chair/bed transfer  Transfers assist  Locomotion Ambulation   Ambulation assist              Walk 10 feet activity   Assist           Walk 50 feet activity   Assist           Walk 150 feet activity   Assist           Walk 10 feet on uneven surface  activity   Assist           Wheelchair     Assist               Wheelchair 50 feet with 2 turns activity    Assist            Wheelchair 150 feet activity     Assist          Blood pressure (!) 127/56, pulse 61, temperature 98.5 F (36.9 C), temperature source Oral, resp. rate 17, height 5' 5 (1.651 m), SpO2 98%.  Medical Problem List and Plan: 1. 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                -patient may shower if leg is covered, vac pump away from water             -ELOS/Goals: 7-10 days, mod I w/c level, supervision ambulatory level  -Patient is beginning  CIR therapies today including PT and OT  2.  Antithrombotics: -DVT/anticoagulation:  Pharmaceutical: *Hold heparin  d/t bleeding from sq inj sites             -antiplatelet therapy: Aspirin  81 mg daily, Brilinta  90 mg twice daily 3. Pain Management: Neurontin  300 mg 3 times daily, oxycodone  as needed             -pt reports her worst pain is from her low back and related left lumbar radiculopathy. No phantom pain currently                         -continue above meds for now. Encouraged oob                           -therapy can work on ROM, posture, etc.  4. Mood/Behavior/Sleep: Lexapro  10 mg daily, BuSpar  15 mg 3 times daily             -mood is positive             -antipsychotic agents: N/A 5. Neuropsych/cognition: This patient is capable of making decisions on her own behalf. 6.  Wound Care/ID:  daptomycin  through 12/28/23 for MRSA bacteremia             -MRSA contact precautions             -pt was changed to traditional vac dressing  6/12 as prevena was not holding suction.  -Still no drainage in cannister. Dressing is due for a change Monday.   -Consider removing Vac Monday if no further drainage in cannister.  7. Fluids/Electrolytes/Nutrition: Routine in and outs with follow-up chemistries 8.  Acute on chronic anemia.   Hgb with drop today to 7.3. minimal sx             -transfused 1u PRBC 6/13             -recheck hgb up to 8.1             -  continue Brilinta  for now  -holding heparin  as above  -recheck labs Monday   -fe++ supp 9.  Diabetes mellitus with peripheral neuropathy.  Hemoglobin A1c 10.2.  NovoLog  3 units 3 times daily, Semglee  5 units daily.  Check blood sugars AC and at bedtime 10.  CAD/stenting/angioplasty as well as history of mitral valve replacement.  Continue aspirin  and Brilinta .  Follow-up cardiology services 11.  Diastolic congestive heart failure.  Coreg  9.375 mg twice daily, Demadex  60 mg daily (hold).  Monitor for any signs of fluid overload             -need  daily weights 12.  GERD.  Protonix  13.  Hyperlipidemia.  Zetia  14.  Chronic hypoxic respiratory failure/OSA.  Continue CPAP 15.  CKD stage IV.  Baseline 2.2-2.4.               6/14 -Cr bumped  to 2.7 and sl to 2.81 today             -may be related to anemia             -continue to hold demadex  -recheck in am   16.  History of gout.  Allopurinol .  Monitor for any gout flares    LOS: 1 days A FACE TO FACE EVALUATION WAS PERFORMED  Rawland Caddy 12/28/2023, 9:49 AM

## 2023-12-28 NOTE — Evaluation (Signed)
 Physical Therapy Assessment and Plan  Patient Details  Name: Dana [name] MRN: [id number] Date of Birth: 06-10-1961  PT Diagnosis: Difficulty walking, Impaired sensation, and Muscle weakness Rehab Potential: Poor ELOS: 10-12 days   Today's Date: 12/28/2023 PT Individual Time: 4098-1191 PT Individual Time Calculation (min): 70 min    Hospital Problem: Principal Problem:   Left below-knee amputee [facility])   Past Medical History:  Past Medical History:  Diagnosis Date   Acquired absence of left great toe (HCC)    Acquired absence of other left toe(s) (HCC)    ACS (acute coronary syndrome) (HCC)    Acute bronchitis due to COVID-19 virus 03/03/2023   Acute on chronic combined systolic and diastolic CHF (congestive heart failure) (HCC) 09/16/2023   Acute respiratory failure with hypoxia (HCC) 07/20/2023   Anemia    Anoxic brain injury (HCC)    Anxiety    CAD (coronary artery disease)    DES mLAD 04/07/15; DES dRCA & DES pPDA 08/30/16; DES mCX & PTCA OM1 09/29/20   Charcot's joint of foot in type 2 diabetes mellitus (HCC) 12/15/2023   Chronic diastolic (congestive) heart failure (HCC)    Chronic pain    Chronic systolic (congestive) heart failure (HCC)    CKD (chronic kidney disease)    Contrast dye induced nephropathy, possible 09/03/2020   COPD (chronic obstructive pulmonary disease) (HCC)    COVID-19 virus infection 03/03/2023   Demand ischemia (HCC)    Diabetes (HCC)type 1    Diastolic CHF (HCC)    Dyspnea    Encounter for screening mammogram for malignant neoplasm of breast 09/16/2023   Family history of breast cancer 10/23/2021   Gastro-esophageal reflux disease without esophagitis    Hyperlipidemia    Hypertension    Hypertensive heart disease with heart failure (HCC)    Hypoxemia    Idiopathic gout, multiple sites    Leukocytosis 03/29/2022   Localized edema    Low back pain    Mitral regurgitation    Mitral regurgitation    Mixed hyperlipidemia    Mixed  incontinence    Morbid (severe) obesity due to excess calories (HCC)    Neuromuscular disorder (HCC)    Neuropathy    Non-ST elevation (NSTEMI) myocardial infarction (HCC)    08/29/20, 09/29/20   OSA (obstructive sleep apnea) 09/03/2020   Other specified hypothyroidism    PAF (paroxysmal atrial fibrillation) (HCC)    s/p pulmonary vein isolation by cryoablation 10/04/15 Dr. Jearldine Mina in [facility]   PEA (Pulseless electrical activity) [facility])    ~ 01/13/21 at [facility] during admission DKA, volume overload, possible CAP, hypoxia s/p ACLS with ROSC ~ 10-15   Pneumonia    Primary insomnia    Rheumatoid arthritis (HCC)    Stroke (HCC)    Thyroid  disease    Type 1 diabetes mellitus (HCC)    URI with cough and congestion 08/22/2023   Vitamin D  deficiency    Past Surgical History:  Past Surgical History:  Procedure Laterality Date   ABDOMINAL HYSTERECTOMY     Partial- Still has both ovaries   AMPUTATION Left 12/18/2023   Procedure: AMPUTATION BELOW KNEE;  Surgeon: Timothy Ford, MD;  Location: [facility] OR;  Service: Orthopedics;  Laterality: Left;   ANKLE FUSION Left 11/13/2023   Procedure: HINDFOOT FUSION FIXATION OF ANKLE;  Surgeon: Laneta Pintos, MD;  Location: [facility] OR;  Service: Orthopedics;  Laterality: Left;   CARDIOVASCULAR STRESS TEST  08/13/2014   Nuclear; Normal   CARPAL TUNNEL  RELEASE     CATARACT EXTRACTION, BILATERAL     CESAREAN SECTION     CHOLECYSTECTOMY     CORONARY ANGIOPLASTY WITH STENT PLACEMENT     3 blockages/ 1 stent   CORONARY BALLOON ANGIOPLASTY N/A 07/18/2023   Procedure: CORONARY BALLOON ANGIOPLASTY;  Surgeon: Odie Benne, MD;  Location: [facility] INVASIVE CV LAB;  Service: Cardiovascular;  Laterality: N/A;   CORONARY PRESSURE/FFR STUDY N/A 09/07/2022   Procedure: INTRAVASCULAR PRESSURE WIRE/FFR STUDY;  Surgeon: Swaziland, Peter M, MD;  Location: [facility] INVASIVE CV LAB;  Service: Cardiovascular;  Laterality: N/A;   CORONARY STENT INTERVENTION N/A 09/29/2020    Procedure: CORONARY STENT INTERVENTION;  Surgeon: Arnoldo Lapping, MD;  Location: [facility] INVASIVE CV LAB;  Service: Cardiovascular;  Laterality: N/A;  CFX   CORONARY STENT INTERVENTION N/A 09/07/2022   Procedure: CORONARY STENT INTERVENTION;  Surgeon: Swaziland, Peter M, MD;  Location: [facility] INVASIVE CV LAB;  Service: Cardiovascular;  Laterality: N/A;   CORONARY STENT INTERVENTION N/A 02/13/2023   Procedure: CORONARY STENT INTERVENTION;  Surgeon: Odie Benne, MD;  [facility]: [facility] INVASIVE CV LAB;  Service: Cardiovascular;  Laterality: N/A;  LAD   CORONARY ULTRASOUND/IVUS N/A 02/13/2023   Procedure: Coronary Ultrasound/IVUS;  Surgeon: Odie Benne, MD;  [facility]: [facility] INVASIVE CV LAB;  Service: Cardiovascular;  Laterality: N/A;   CORONARY/GRAFT ACUTE MI REVASCULARIZATION N/A 12/08/2022   Procedure: Coronary/Graft Acute MI Revascularization;  Surgeon: Arleen Lacer, MD;  Location: [facility] INVASIVE CV LAB;  Service: Cardiovascular;  Laterality: N/A;   EYE SURGERY     LEFT HEART CATH AND CORONARY ANGIOGRAPHY N/A 09/29/2020   Procedure: LEFT HEART CATH AND CORONARY ANGIOGRAPHY;  Surgeon: Arnoldo Lapping, MD;  Location: [facility] INVASIVE CV LAB;  Service: Cardiovascular;  Laterality: N/A;   LEFT HEART CATH AND CORONARY ANGIOGRAPHY N/A 09/07/2022   Procedure: LEFT HEART CATH AND CORONARY ANGIOGRAPHY;  Surgeon: Swaziland, Peter M, MD;  Location: [facility] INVASIVE CV LAB;  Service: Cardiovascular;  Laterality: N/A;   LEFT HEART CATH AND CORONARY ANGIOGRAPHY N/A 12/08/2022   Procedure: LEFT HEART CATH AND CORONARY ANGIOGRAPHY;  Surgeon: Arleen Lacer, MD;  Location: [facility] INVASIVE CV LAB;  Service: Cardiovascular;  Laterality: N/A;   LEFT HEART CATH AND CORONARY ANGIOGRAPHY N/A 02/13/2023   Procedure: LEFT HEART CATH AND CORONARY ANGIOGRAPHY;  Surgeon: Odie Benne, MD;  [facility] INVASIVE CV LAB;  Service: Cardiovascular;  Laterality: N/A;   LEFT HEART CATH AND CORONARY ANGIOGRAPHY N/A 07/16/2023    Procedure: LEFT HEART CATH AND CORONARY ANGIOGRAPHY;  Surgeon: Odie Benne, MD;  [facility]: [facility] INVASIVE CV LAB;  Service: Cardiovascular;  Laterality: N/A;   RIGHT HEART CATH N/A 04/27/2021   Procedure: RIGHT HEART CATH;  Surgeon: Mardell Shade, MD;  Location: [facility] INVASIVE CV LAB;  Service: Cardiovascular;  Laterality: N/A;   RIGHT/LEFT HEART CATH AND CORONARY ANGIOGRAPHY N/A 01/07/2017   Procedure: Right/Left Heart Cath and Coronary Angiography;  Surgeon: Darlis Eisenmenger, MD;  Location: [facility] INVASIVE CV LAB;  Service: Cardiovascular;  Laterality: N/A;   ROTATOR CUFF REPAIR     TEE WITHOUT CARDIOVERSION N/A 04/28/2021   Procedure: TRANSESOPHAGEAL ECHOCARDIOGRAM (TEE);  Surgeon: Mardell Shade, MD;  Location: [facility] ENDOSCOPY;  Service: Cardiovascular;  Laterality: N/A;   TEE WITHOUT CARDIOVERSION N/A 05/18/2021   Procedure: TRANSESOPHAGEAL ECHOCARDIOGRAM (TEE);  Surgeon: Thukkani, Arun K, MD;  Location: [place] INVASIVE CV LAB;  Service: Cardiovascular;  Laterality: N/A;   TOE AMPUTATION Left    TRANSCATHETER MITRAL EDGE TO EDGE REPAIR N/A 05/18/2021  Procedure: MITRAL VALVE REPAIR;  Surgeon: Thukkani, Arun K, MD;  Location: [facility] INVASIVE CV LAB;  Service: Cardiovascular;  Laterality: N/A;   TRANSESOPHAGEAL ECHOCARDIOGRAM (CATH LAB) N/A 12/23/2023   Procedure: TRANSESOPHAGEAL ECHOCARDIOGRAM;  Surgeon: Wendie Hamburg, MD;  Location: [facility] INVASIVE CV LAB;  Service: Cardiovascular;  Laterality: N/A;   US  ECHOCARDIOGRAPHY  05/2017   Normal    Assessment & Plan Clinical Impression: Patient is a 63 year old right-handed female with history significant for hyperlipidemia, rheumatoid arthritis, CVA, insulin -dependent diabetes mellitus, CKD stage IV, CAD with multiple stents and angioplasty 07/18/2023 as well as history of mitral valve repair, maintained on low-dose aspirin  as well as Brilinta , chronic hypoxic respiratory failure, OSA on CPAP and quit smoking 41 years ago, chronic diastolic  congestive heart failure, depression/anxiety, chronic pain, left trimalleolar ankle fracture/dislocation status post repair on 11/13/2023. Per chart review patient lives with parent. 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 She is able to live on the main level of the home with bedroom and bathroom. There is a ramped entrance. Presented 12/14/2023 from [facility] after presenting to the ED 5/29 for fevers, dysuria and flank pain. She was started on IV fluids and broad-spectrum antibiotics for suspected UTI with CT abdomen pelvis showing bladder wall thickening on CT as well as noted to have purulent drainage from left heel wound where cultures grew MRSA. She was transferred to [facility]. Blood culture showed no growth, lactic acid within normal limits, sodium 134, glucose 137, BUN 50, creatinine 2.25, hemoglobin 7.2, WBC 19,600. Infectious disease consulted placed on vancomycin . Chest x-ray showed prominent cardiac silhouette and vascular congestion. CT renal stone study showed no hydronephrosis. Enlarged left ovary with surrounding inflammatory stranding and fluid. Transabdominal ultrasound of the pelvis showed nonvisualization of the ovaries bilaterally. Trace simple appearing free fluid within the right adnexa, nonspecific. Nuclear medicine perfusion lung scan no evidence of acute pulmonary emboli. Venous Doppler studies lower extremities negative for DVT. Dr. Julio Ohm follow-up for left heel wound and x-ray showed postoperative changes disrupted ankle mortise soft tissue swelling. Limb was not felt to be salvageable preoperative cardiac clearance obtained and underwent left BKA 12/18/2023 per Dr. Julio Ohm with wound VAC applied and orders to CONTINUE WOUND VAC WITH PREVENA PLUS PUMP AT DISCHARGE FOR 1 WEEK.[facility] course acute on chronic anemia latest hemoglobin 8.8. Maintained on daptomycin  x 5 days postoperatively. Leukocytosis improved to  12,600. Renal function remained stable with latest creatinine 2.31. Patient was cleared for subcutaneous heparin  for DVT prophylaxis. Her aspirin  and Brilinta  were resumed postoperatively. Patient did receive psychiatry consult during hospitalization for depression remained on Ativan  as needed as well as Lexapro /BuSpar . Therapy evaluations completed due to patient decreased functional mobility was admitted for a comprehensive rehab program.  Patient transferred to CIR on 12/27/2023 .   Patient currently requires max with mobility secondary to muscle weakness, decreased activity tolerance, and decreased balance strategies.  Prior to hospitalization, patient was independent  with mobility and lived with Spouse, Family in a House home.  Home access is  Ramped entrance.  Patient will benefit from skilled PT intervention to maximize safe functional mobility, minimize fall risk, and decrease caregiver burden for planned discharge home with 24 hour supervision.  Anticipate patient will benefit from follow up HH at discharge.  PT - End of Session Activity Tolerance: Tolerates 30+ min activity with multiple rests Endurance Deficit: Yes Endurance Deficit Description: low activity tolerance 2/2 healing after major surgery  PT Assessment Rehab Potential (ACUTE/IP ONLY): Poor PT Barriers to Discharge: Decreased caregiver support;Wound Care;Lack of/limited family support;Weight bearing restrictions PT Barriers to Discharge Comments: mother will be helping during day, but can only give light set up assistance and food preparation support PT Patient demonstrates impairments in the following area(s): Balance;Edema;Endurance;Motor;Pain;Safety;Sensory;Skin Integrity PT Transfers Functional Problem(s): Bed Mobility;Bed to Chair;Car PT Locomotion Functional Problem(s): Wheelchair Mobility;Ambulation PT Plan PT Intensity: Minimum of 1-2 x/day ,45 to 90 minutes PT Frequency: 5 out of 7 days PT Duration Estimated Length  of Stay: 10-12 days PT Treatment/Interventions: Ambulation/gait training;Balance/vestibular training;Cognitive remediation/compensation;Discharge planning;Community reintegration;Disease management/prevention;DME/adaptive equipment instruction;Functional mobility training;Neuromuscular re-education;Pain management;Patient/family education;Psychosocial support;Skin care/wound management;Splinting/orthotics;Stair training;Therapeutic Activities;Therapeutic Exercise;UE/LE Strength taining/ROM;UE/LE Coordination activities;Wheelchair propulsion/positioning PT Transfers Anticipated Outcome(s): mod I PT Locomotion Anticipated Outcome(s): mod I with WC mobility PT Recommendation Recommendations for Other Services: None Follow Up Recommendations: Home health PT Patient destination: Home Equipment Recommended: To be determined;Wheelchair (measurements);Wheelchair cushion (measurements) Equipment Details: has RW and rollator, may need WC   PT Evaluation Precautions/Restrictions Precautions Precautions: Fall;Other (comment) Recall of Precautions/Restrictions: Intact Precaution/Restrictions Comments: LLE would vac, L limb protector Restrictions Weight Bearing Restrictions Per Provider Order: Yes LLE Weight Bearing Per Provider Order: Non weight bearing General Chart Reviewed: Yes Family/Caregiver Present: No Vital Signs  Pain Pain Assessment Pain Scale: 0-10 Pain Score: 6  Pain Type: Chronic pain Pain Location: Generalized Pain Orientation: Left Pain Descriptors / Indicators: Discomfort;Constant Pain Onset: On-going Pain Intervention(s): Medication (See eMAR) Multiple Pain Sites: No Pain Interference Pain Interference Pain Effect on Sleep: 3. Frequently Pain Interference with Therapy Activities: 2. Occasionally Pain Interference with Day-to-Day Activities: 3. Frequently Home Living/Prior Functioning Home Living Available Help at Discharge: Family;Available 24 hours/day Type of Home:  House Home Access: Ramped entrance Home Layout: Two level;Able to live on main level with bedroom/bathroom (main level is top floor - has basement, but does not use it) Alternate Level Stairs-Number of Steps: 13 Bathroom Shower/Tub: Health visitor: Handicapped height (has a toilet riser) Bathroom Accessibility: Yes Additional Comments: Pt avoids stairs if at all possible  Lives With: Spouse;Family Prior Function  Able to Take Stairs?: Yes Driving: Yes Vision/Perception  Vision - History Ability to See in Adequate Light: 0 Adequate Perception Perception: Not tested Praxis Praxis: Not tested  Cognition Overall Cognitive Status: Within Functional Limits for tasks assessed Arousal/Alertness: Awake/alert Orientation Level: Oriented X4 Memory: Appears intact Awareness: Appears intact Problem Solving: Appears intact Safety/Judgment: Appears intact Sensation Sensation Light Touch: Impaired by gross assessment (neuropathy in RLE, LLE not tested) Hot/Cold: Not tested Proprioception: Not tested Stereognosis: Not tested Coordination Gross Motor Movements are Fluid and Coordinated: No Fine Motor Movements are Fluid and Coordinated: Yes Motor  Motor Motor: Other (comment) Motor - Skilled Clinical Observations: impaired d/t left BKE   Trunk/Postural Assessment  Cervical Assessment Cervical Assessment: Exceptions to [facility] ([facility]) Postural Control Postural Control: Within Functional Limits  Balance Balance Balance Assessed: Yes Static Standing Balance Static Standing - Level of Assistance: 3: Mod assist Extremity Assessment      RLE Assessment RLE Assessment: Exceptions to [facility] General Strength Comments: gross 4/5 LLE Assessment LLE Assessment: Not tested  Care Tool Care Tool Bed Mobility Roll left and right activity   Roll left and right assist level: Supervision/Verbal cueing    Sit to lying activity   Sit to lying assist level: Contact Guard/Touching  assist    Lying to sitting on side of bed activity         Care Tool Transfers Sit  to stand transfer   Sit to stand assist level: Total Assistance - Patient < 25%    Chair/bed transfer   Chair/bed transfer assist level: Minimal Assistance - Patient > 75%    Car transfer Car transfer activity did not occur: Environmental limitations (2/2 endurance deficit and WB restrictions)        Care Tool Locomotion Ambulation Ambulation activity did not occur: Safety/medical concerns (2/2 endurance deficit and WB restrictions)        Walk 10 feet activity Walk 10 feet activity did not occur: Safety/medical concerns (2/2 endurance deficit and WB restrictions)       Walk 50 feet with 2 turns activity Walk 50 feet with 2 turns activity did not occur: Safety/medical concerns (2/2 endurance deficit and WB restrictions)      Walk 150 feet activity Walk 150 feet activity did not occur: Safety/medical concerns (2/2 endurance deficit and WB restrictions)      Walk 10 feet on uneven surfaces activity Walk 10 feet on uneven surfaces activity did not occur: Safety/medical concerns (2/2 endurance deficit and WB restrictions)      Stairs Stair activity did not occur: Safety/medical concerns (2/2 endurance deficit and WB restrictions)        Walk up/down 1 step activity Walk up/down 1 step or curb (drop down) activity did not occur: Safety/medical concerns (2/2 endurance deficit and WB restrictions)      Walk up/down 4 steps activity Walk up/down 4 steps activity did not occur: Safety/medical concerns (2/2 endurance deficit and WB restrictions)      Walk up/down 12 steps activity Walk up/down 12 steps activity did not occur: Safety/medical concerns (2/2 endurance deficit and WB restrictions)      Pick up small objects from floor Pick up small object from the floor (from standing position) activity did not occur: Safety/medical concerns (2/2 endurance deficit and WB restrictions)       Wheelchair Is the patient using a wheelchair?: Yes Type of Wheelchair: Manual   Wheelchair assist level: Maximal Assistance - Patient 25 - 49% Max wheelchair distance: 10 ft  Wheel 50 feet with 2 turns activity   Assist Level: Dependent - Patient 0%  Wheel 150 feet activity   Assist Level: Dependent - Patient 0%    Refer to Care Plan for Long Term Goals  SHORT TERM GOAL WEEK 1 PT Short Term Goal 1 (Week 1): Pt will complete bed mobility with distant supervision PT Short Term Goal 2 (Week 1): Pt will complete bed > chair transfer with CGA + LRAD PT Short Term Goal 3 (Week 1): Pt will complete sit to stand with modA + LRAD PT Short Term Goal 4 (Week 1): Pt will complete 77ft WC propulsion with minA  Recommendations for other services: None   Skilled Therapeutic Intervention Mobility Bed Mobility Bed Mobility: Supine to Sit Supine to Sit: Supervision/Verbal cueing Transfers Transfers: Sit to Stand;Stand to Sit Sit to Stand: Total Assistance - Patient < 25% Stand to Sit: Total Assistance - Patient < 25% Transfer (Assistive device): Other (Comment) (STEDY) Locomotion  Gait Ambulation: No Gait Gait: No Stairs / Additional Locomotion Stairs: No Ramp: Total Assistance - Patient <25% Curb: Total Assistance - Patient <25% Wheelchair Mobility Wheelchair Mobility: Yes Wheelchair Assistance: Maximal Assistance - Patient 25 - 49% Wheelchair Propulsion: Both upper extremities Wheelchair Parts Management: Needs assistance Distance: 39ft   Session Note: Chart reviewed and pt agreeable to therapy. Pt received seated in WC with c/o pain as described above. Session focused  on evaluation and initiation of sit to stand practice. Pt initiated session with evaluation as detailed above. Pt then continued sit to stand at [facility] using maxA + 2. Session education emphasized therapy goals, role of PT, care continuum, and fall prevention. At end of session, pt was left seated in WC. Of note, no  alarm present in room. Pt gave verbal agreement to call for nursing before getting out of [facility]. RN informed and agreed. Pt left with nurse call bell and all needs in reach.    Discharge Criteria: Patient will be discharged from PT if patient refuses treatment 3 consecutive times without medical reason, if treatment goals not met, if there is a change in medical status, if patient makes no progress towards goals or if patient is discharged from hospital.  The above assessment, treatment plan, treatment alternatives and goals were discussed and mutually agreed upon: by patient  Salvador Bigbee G Avree Szczygiel 12/28/2023, 12:13 PM

## 2023-12-28 NOTE — Progress Notes (Signed)
 Pt newly admitted yesterday. Pt noted with history of significant bleeding from heparin  injections per her report and evidenced by multiple dressings on abdomen from previous injections.  Pt noted with significant bleeding from injection last night. Pressure dressing applied after holding site for approximately 5 minutes. Dr. Rachel Budds made aware this am.

## 2023-12-29 DIAGNOSIS — Z89512 Acquired absence of left leg below knee: Secondary | ICD-10-CM | POA: Diagnosis not present

## 2023-12-29 DIAGNOSIS — L03116 Cellulitis of left lower limb: Secondary | ICD-10-CM | POA: Diagnosis not present

## 2023-12-29 DIAGNOSIS — D62 Acute posthemorrhagic anemia: Secondary | ICD-10-CM | POA: Diagnosis not present

## 2023-12-29 DIAGNOSIS — N184 Chronic kidney disease, stage 4 (severe): Secondary | ICD-10-CM | POA: Diagnosis not present

## 2023-12-29 LAB — GLUCOSE, CAPILLARY
Glucose-Capillary: 133 mg/dL — ABNORMAL HIGH (ref 70–99)
Glucose-Capillary: 174 mg/dL — ABNORMAL HIGH (ref 70–99)
Glucose-Capillary: 136 mg/dL — ABNORMAL HIGH (ref 70–99)
Glucose-Capillary: 185 mg/dL — ABNORMAL HIGH (ref 70–99)

## 2023-12-29 LAB — BASIC METABOLIC PANEL WITH GFR
Anion gap: 11 (ref 5–15)
BUN: 58 mg/dL — ABNORMAL HIGH (ref 6–20)
CO2: 26 mmol/L (ref 22–32)
Calcium: 8.5 mg/dL — ABNORMAL LOW (ref 8.9–10.3)
Chloride: 98 mmol/L (ref 98–111)
Creatinine, Ser: 3.22 mg/dL — ABNORMAL HIGH (ref 0.44–1.00)
GFR, Estimated: 16 mL/min — ABNORMAL LOW (ref >60)
Glucose, Bld: 130 mg/dL — ABNORMAL HIGH (ref 70–99)
Potassium: 5 mmol/L (ref 3.5–5.1)
Sodium: 135 mmol/L (ref 135–145)

## 2023-12-29 NOTE — Progress Notes (Signed)
 RN placed pt on home CPAP.

## 2023-12-29 NOTE — Progress Notes (Signed)
 Occupational Therapy Session Note  Patient Details  Name: Dana [name] MRN: [id number] Date of Birth: 1961/05/27  Session 1: {CHL IP REHAB OT TIME CALCULATIONS:304400400} Session 2: {CHL IP REHAB OT TIME CALCULATIONS:304400400}  Short Term Goals: Week 1:  OT Short Term Goal 1 (Week 1): Pt will complete sit to stand at sink  at Max A while completing self care task in order to increase functional performance and standing balance. OT Short Term Goal 2 (Week 1): Pt will complete LB dressing while utilizing either lateral leans or sit to stand with Mod A. OT Short Term Goal 3 (Week 1): Pt will don limb guard with Min A demonstrating improved independence with residual limb management.  Skilled Therapeutic Interventions/Progress Updates:    Session 1: Patient agreeable to participate in OT session. Reports *** pain level.   Patient participated in skilled OT session focusing on ***. Therapist facilitated/assessed/developed/educated/integrated/elicited *** in order to improve/facilitate/promote   Session 2: Patient agreeable to participate in OT session. Reports *** pain level.   Patient participated in skilled OT session focusing on ***. Therapist facilitated/assessed/developed/educated/integrated/elicited *** in order to improve/facilitate/promote    Therapy Documentation Precautions:  Precautions Precautions: Fall, Other (comment) Recall of Precautions/Restrictions: Intact Precaution/Restrictions Comments: LLE would vac, L limb protector Restrictions Weight Bearing Restrictions Per Provider Order: Yes LLE Weight Bearing Per Provider Order: Non weight bearing  Therapy/Group: Individual Therapy  [name], OTR/L,CBIS  Supplemental OT - [facility] and [facility] Secure Chat Preferred   12/29/2023, 9:41 PM

## 2023-12-29 NOTE — Progress Notes (Addendum)
 PROGRESS NOTE   Subjective/Complaints: No problems overnight. Therapy went well. She was pleased with what she could do.   ROS: Patient denies fever, rash, sore throat, blurred vision, dizziness, nausea, vomiting, diarrhea, cough, shortness of breath or chest pain, joint or back/neck pain, headache, or mood change.    Objective:   No results found. Recent Labs    12/27/23 0628 12/27/23 1829  WBC 10.1 11.9*  HGB 7.3* 8.1*  HCT 23.6* 26.3*  PLT 275 301   Recent Labs    12/28/23 0808 12/29/23 0518  NA 134* 135  K 4.6 5.0  CL 96* 98  CO2 27 26  GLUCOSE 195* 130*  BUN 58* 58*  CREATININE 2.81* 3.22*  CALCIUM  8.6* 8.5*    Intake/Output Summary (Last 24 hours) at 12/29/2023 0923 Last data filed at 12/29/2023 0724 Gross per 24 hour  Intake 356 ml  Output 0 ml  Net 356 ml        Physical Exam: Vital Signs Blood pressure (!) 109/40, pulse 69, temperature 98.5 F (36.9 C), temperature source Oral, resp. rate 18, height 5' 5 (1.651 m), SpO2 96%.  Constitutional: No distress . Vital signs reviewed. HEENT: NCAT, EOMI, oral membranes moist Neck: supple Cardiovascular: RRR without murmur. No JVD    Respiratory/Chest: CTA Bilaterally without wheezes or rales. Normal effort    GI/Abdomen: BS +, sl tender, non-distended, bruising Ext: no clubbing, cyanosis, or edema Psych: pleasant and cooperative  Skin: Clean and intact without signs of breakdown, abd bruising from heparin  shots Neuro:  Alert and oriented x 3. Normal insight and awareness. Intact Memory. Normal language and speech. Cranial nerve exam unremarkable. MMT: BUE 4+ to 5/5. LLE limited by vac, pain, at least 3 to 3+/5. RLE 4 to 4+/5. Sensory exam normal for light touch and pain in all 4 limbs. No limb ataxia or cerebellar signs. No abnormal tone appreciated.  .   Musculoskeletal: Left leg remains swollen, shrinker in place, vac. Tender to palpation     Assessment/Plan: 1. Functional deficits which require 3+ hours per day of interdisciplinary therapy in a comprehensive inpatient rehab setting. Physiatrist is providing close team supervision and 24 hour management of active medical problems listed below. Physiatrist and rehab team continue to assess barriers to discharge/monitor patient progress toward functional and medical goals  Care Tool:  Bathing    Body parts bathed by patient: Right arm, Left arm, Chest, Abdomen, Right upper leg, Left upper leg, Face   Body parts bathed by helper: Front perineal area, Buttocks, Right lower leg Body parts n/a: Left lower leg   Bathing assist Assist Level: Moderate Assistance - Patient 50 - 74%     Upper Body Dressing/Undressing Upper body dressing   What is the patient wearing?: Pull over shirt    Upper body assist Assist Level: Set up assist    Lower Body Dressing/Undressing Lower body dressing      What is the patient wearing?: Pants, Orthosis, Ace wrap/stump shrinker     Lower body assist Assist for lower body dressing: Maximal Assistance - Patient 25 - 49%     Toileting Toileting    Toileting assist Assist for toileting: Maximal Assistance -  Patient 25 - 49%     Transfers Chair/bed transfer  Transfers assist     Chair/bed transfer assist level: Minimal Assistance - Patient > 75%     Locomotion Ambulation   Ambulation assist   Ambulation activity did not occur: Safety/medical concerns (2/2 endurance deficit and WB restrictions)          Walk 10 feet activity   Assist  Walk 10 feet activity did not occur: Safety/medical concerns (2/2 endurance deficit and WB restrictions)        Walk 50 feet activity   Assist Walk 50 feet with 2 turns activity did not occur: Safety/medical concerns (2/2 endurance deficit and WB restrictions)         Walk 150 feet activity   Assist Walk 150 feet activity did not occur: Safety/medical concerns (2/2  endurance deficit and WB restrictions)         Walk 10 feet on uneven surface  activity   Assist Walk 10 feet on uneven surfaces activity did not occur: Safety/medical concerns (2/2 endurance deficit and WB restrictions)         Wheelchair     Assist Is the patient using a wheelchair?: Yes Type of Wheelchair: Manual    Wheelchair assist level: Maximal Assistance - Patient 25 - 49% Max wheelchair distance: 10 ft    Wheelchair 50 feet with 2 turns activity    Assist        Assist Level: Dependent - Patient 0%   Wheelchair 150 feet activity     Assist      Assist Level: Dependent - Patient 0%   Blood pressure (!) 109/40, pulse 69, temperature 98.5 F (36.9 C), temperature source Oral, resp. rate 18, height 5' 5 (1.651 m), SpO2 96%.  Medical Problem List and Plan: 1. 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                -patient may shower if leg is covered, vac pump away from water             -ELOS/Goals: 7-10 days, mod I w/c level, supervision ambulatory level  -Continue CIR therapies including PT, OT  2.  Antithrombotics: -DVT/anticoagulation:  Pharmaceutical: *dc'ed sq heparin  d/t bleeding from sq inj sites  -SCD RLE             -antiplatelet therapy: Aspirin  81 mg daily, Brilinta  90 mg twice daily 3. Pain Management: Neurontin  300 mg 3 times daily, oxycodone  as needed             -pt reports her worst pain is from her low back and related left lumbar radiculopathy. No phantom pain currently                         -continue above meds for now. Encouraged oob                           -therapy can work on ROM, posture, etc.  4. Mood/Behavior/Sleep: Lexapro  10 mg daily, BuSpar  15 mg 3 times daily             -mood is positive             -antipsychotic agents: N/A 5. Neuropsych/cognition: This patient is capable of making decisions on her own behalf. 6.  Wound Care/ID:  daptomycin  through  12/28/23 for MRSA bacteremia             -  MRSA contact precautions             -pt was changed to traditional vac dressing  6/12 as prevena was not holding suction.  6/15-Still no drainage in cannister. Dressing is due for a change Monday.   **Consider removing Vac Monday if no further drainage in cannister.  7. Fluids/Electrolytes/Nutrition: Routine in and outs with follow-up chemistries 8.  Acute on chronic anemia.   Hgb with drop today to 7.3. minimal sx             -transfused 1u PRBC 6/13             -recheck hgb up to 8.1             -continue Brilinta  for now  -holding heparin  as above  -recheck labs Monday   -fe++ supp 9.  Diabetes mellitus with peripheral neuropathy.  Hemoglobin A1c 10.2.  NovoLog  3 units 3 times daily, Semglee  5 units daily.  Check blood sugars AC and at bedtime 10.  CAD/stenting/angioplasty as well as history of mitral valve replacement.  Continue aspirin  and Brilinta .  Follow-up cardiology services 11.  Diastolic congestive heart failure.  Coreg  9.375 mg twice daily, Demadex  60 mg daily (hold).  Monitor for any signs of fluid overload             -need weights 12.  GERD.  Protonix  13.  Hyperlipidemia.  Zetia  14.  Chronic hypoxic respiratory failure/OSA.  Continue CPAP 15.  CKD stage IV.  Baseline 2.2-2.4.               6/14 -Cr bumped  to 2.7 and sl to 2.81 today              -may be related to anemia              -continue to hold demadex  6/15- Cr continues to trend up-- 3.22  -still think this is due to demadex  she has received up until Friday  - push fluids and recheck BMET in am  -if further increase tomorrow will need to contact nephrology 16.  History of gout.  Allopurinol .  Monitor for any gout flares    LOS: 2 days A FACE TO FACE EVALUATION WAS PERFORMED  Rawland Caddy 12/29/2023, 9:23 AM

## 2023-12-29 NOTE — Discharge Instructions (Addendum)
 Inpatient Rehab Discharge Instructions  Dana [name] Discharge date and time: No discharge date for patient encounter.   Activities/Precautions/ Functional Status: Activity: As tolerated Diet: Diabetic diet Wound Care: Routine skin checks Functional status:  ___ No restrictions     ___ Walk up steps independently ___ 24/7 supervision/assistance   ___ Walk up steps with assistance ___ Intermittent supervision/assistance  ___ Bathe/dress independently ___ Walk with walker     _x__ Bathe/dress with assistance ___ Walk Independently    ___ Shower independently ___ Walk with assistance    ___ Shower with assistance ___ No alcohol     ___ Return to work/school ________  Special Instructions: No driving smoking or alcohol  Continue stump shrinker as directed   COMMUNITY REFERRALS UPON DISCHARGE:    Home Health:   PT   OT   RN                 Agency:[facility]   Phone:[phone]   Medical Equipment/Items Ordered:HOSPITAL BED, TRANSFER BOARD-30, WHEELCHAIR  WILL GET WIDE DROP-ARM BEDSIDE COMMODE ON OWN                                                 Agency/Supplier:[facility]   [phone]    My questions have been answered and I understand these instructions. I will adhere to these goals and the provided educational materials after my discharge from the hospital.  Patient/Caregiver Signature _______________________________ Date __________  Clinician Signature _______________________________________ Date __________  Please bring this form and your medication list with you to all your follow-up doctor's appointments.

## 2023-12-29 NOTE — Discharge Summary (Signed)
 Physician Discharge Summary  Patient ID: Dana [name] MRN: [id number] DOB/AGE: 1961/01/22 63 y.o.  Admit date: 12/27/2023 Discharge date: 01/09/2024  Discharge Diagnoses:  Principal Problem:   Left below-knee amputee [facility]) Active Problems:   Moderate episode of recurrent major depressive disorder (HCC)   ABLA (acute blood loss anemia) DVT prophylaxis Acute blood loss anemia Diabetes mellitus with peripheral neuropathy CAD/stenting/angioplasty Diastolic congestive heart failure GERD Hyperlipidemia Chronic hypoxic respiratory failure/OSA CKD stage IV History of gout Mood stabilization UTI  Discharged Condition: Stable  Significant Diagnostic Studies: DG Shoulder Right Result Date: 01/07/2024 CLINICAL DATA:  Right shoulder pain for 2 weeks without known injury. EXAM: RIGHT SHOULDER - 2+ VIEW COMPARISON:  August 27, 2023. FINDINGS: Deformity of proximal right humeral head and neck is noted consistent with old fracture. No acute fracture or dislocation is noted. IMPRESSION: Probable old proximal right humeral head and neck fracture. No acute abnormality seen. Electronically Signed   By: Lynwood Landy Raddle M.D.   On: 01/07/2024 16:52   VAS US  LOWER EXTREMITY VENOUS (DVT) Result Date: 01/05/2024  Lower Venous DVT Study Patient Name:  Dana [name]  Date of Exam:   01/04/2024 Medical Rec #: [id number]       Accession #:    [id number] Date of Birth: Jun 03, 1961       Patient Gender: F Patient Age:   74 years Exam Location:  [facility] Procedure:      VAS US  LOWER EXTREMITY VENOUS (DVT) Referring Phys: JOESPH LIKES --------------------------------------------------------------------------------  Indications: Edema. Other Indications: Left BKA 12/18/2023, now in rehabilitation unit. Risk Factors: AKI on CKD IV. Limitations: Body habitus, poor ultrasound/tissue interface and clothing. Comparison Study: Prior negative bilateral LEV done 12/15/23 Performing Technologist: Alberta Lis RVS   Examination Guidelines: A complete evaluation includes B-mode imaging, spectral Doppler, color Doppler, and power Doppler as needed of all accessible portions of each vessel. Bilateral testing is considered an integral part of a complete examination. Limited examinations for reoccurring indications may be performed as noted. The reflux portion of the exam is performed with the patient in reverse Trendelenburg.  +---------+---------------+---------+-----------+----------+-------------------+ RIGHT    CompressibilityPhasicitySpontaneityPropertiesThrombus Aging      +---------+---------------+---------+-----------+----------+-------------------+ CFV      Full           Yes      No                                       +---------+---------------+---------+-----------+----------+-------------------+ SFJ      Full                                                             +---------+---------------+---------+-----------+----------+-------------------+ FV Prox  Full                                                             +---------+---------------+---------+-----------+----------+-------------------+ FV Mid  Not well visualized +---------+---------------+---------+-----------+----------+-------------------+ FV Distal               Yes      No                   patent by color and                                                       Doppler             +---------+---------------+---------+-----------+----------+-------------------+ PFV      Full                                                             +---------+---------------+---------+-----------+----------+-------------------+ POP      Full           Yes      No                                       +---------+---------------+---------+-----------+----------+-------------------+ PTV      Full                                                              +---------+---------------+---------+-----------+----------+-------------------+ PERO     Full                                                             +---------+---------------+---------+-----------+----------+-------------------+   Left Technical Findings: Left leg not evaluated.   Summary: RIGHT: - There is no evidence of deep vein thrombosis in the lower extremity. However, portions of this examination were limited- see technologist comments above.  - No cystic structure found in the popliteal fossa. Interstitial edema noted throughout the right lower extremity   *See table(s) above for measurements and observations. Electronically signed by Penne Colorado MD on 01/05/2024 at 10:32:04 AM.    Final    US  RENAL Result Date: 12/30/2023 CLINICAL DATA:  Impaired renal function. EXAM: RENAL / URINARY TRACT ULTRASOUND COMPLETE COMPARISON:  07/15/2023. FINDINGS: Right Kidney: Length: 10.7 cm. Increased echogenicity. No mass or hydronephrosis visualized. Left Kidney: Length: 10.1 cm. Echogenicity within normal limits. No mass or hydronephrosis visualized. Bladder: Empty with a Foley. IMPRESSION: Increased echogenicity of the right kidney consistent with medical renal disease. No other abnormalities. Electronically Signed   By: Fonda Field M.D.   On: 12/30/2023 20:19   ECHO TEE Result Date: 12/23/2023    TRANSESOPHOGEAL ECHO REPORT   Patient Name:   Dana [name] Date of Exam: 12/23/2023 Medical Rec #:  [id number]      Height:       65.0 in Accession #:    [id number]  Weight:       216.0 lb Date of Birth:  [birth year]      BSA:          2.044 m Patient Age:    62 years       BP:           144/62 mmHg Patient Gender: F              HR:           79 bpm. Exam Location:  Inpatient Procedure: Transesophageal Echo, 3D Echo, Cardiac Doppler and Color Doppler            (Both Spectral and Color Flow Doppler were utilized during            procedure). Indications:     Endocarditis  History:         Patient  has prior history of Echocardiogram examinations, most                  recent 12/15/2023. CHF, CAD, Mitral Valve Disease,                  Arrythmias:Atrial Fibrillation; Risk Factors:Hypertension,                  Diabetes and Sleep Apnea.                   Mitral Valve: Mitra-Clip valve is present in the mitral                  position.  Sonographer:     Jayson Gaskins Referring Phys:  [id number] BRIDGETTE CHRISTOPHER Diagnosing Phys: Lonni Nanas MD PROCEDURE: After discussion of the risks and benefits of a TEE, an informed consent was obtained from the patient. The transesophogeal probe was passed without difficulty through the esophogus of the patient. Sedation performed by different physician. The patient was monitored while under deep sedation. Anesthestetic sedation was provided intravenously by Anesthesiology: 305mg  of Propofol , 60mg  of Lidocaine . The patient developed no complications during the procedure.  IMPRESSIONS  1. Left ventricular ejection fraction, by estimation, is 50 to 55%. The left ventricle has low normal function.  2. Right ventricular systolic function is normal. The right ventricular size is mildly enlarged.  3. Left atrial size was mildly dilated. No left atrial/left atrial appendage thrombus was detected.  4. The aortic valve is tricuspid. Aortic valve regurgitation is not visualized. No aortic stenosis is present.  5. 3D performed of the mitral valve, 3D performed of the aortic valve and 3D performed of the atrial septum  6. Evidence of atrial level shunting detected by color flow Doppler. Known iatrogenic ASD after mitraclip with left to right flow.  7. There is a Mitra-Clip present in the mitral position.     Severe mitral valve regurgitation. No evidence of mitral stenosis. The mean mitral valve gradient is 4.0 mmHg with average heart rate of 65 bpm. Blunting but no clear reversal of pulmonary vein systolic flow. Thre are 2 MR jets with 3D VCA 0.35cm^2 for larger jet and 0.11cm^2  for smaller jet, with total 3D VCA 0.46cm^2 suggesting severe MR. Conclusion(s)/Recommendation(s): No evidence of vegetation/infective endocarditis on this transesophageael echocardiogram. FINDINGS  Left Ventricle: Left ventricular ejection fraction, by estimation, is 50 to 55%. The left ventricle has low normal function. The left ventricular internal cavity size was normal in size. Right Ventricle: The right ventricular size is mildly enlarged. No increase in right ventricular wall thickness. Right ventricular  systolic function is normal. Left Atrium: Left atrial size was mildly dilated. No left atrial/left atrial appendage thrombus was detected. Right Atrium: Right atrial size was normal in size. Pericardium: There is no evidence of pericardial effusion. Mitral Valve: The mitral valve has been repaired/replaced. Severe mitral valve regurgitation. There is a Mitra-Clip present in the mitral position. No evidence of mitral valve stenosis. MV peak gradient, 14.0 mmHg. The mean mitral valve gradient is 4.0 mmHg with average heart rate of 65 bpm. Tricuspid Valve: The tricuspid valve is normal in structure. Tricuspid valve regurgitation is trivial. Aortic Valve: The aortic valve is tricuspid. Aortic valve regurgitation is not visualized. No aortic stenosis is present. Pulmonic Valve: The pulmonic valve was not well visualized. Pulmonic valve regurgitation is trivial. Aorta: The aortic root and ascending aorta are structurally normal, with no evidence of dilitation. IAS/Shunts: Evidence of atrial level shunting detected by color flow Doppler. Additional Comments: 3D was performed not requiring image post processing on an independent workstation and was abnormal. MITRAL VALVE MV Area (PHT): 2.56 cm MV Peak grad:  14.0 mmHg MV Mean grad:  4.0 mmHg MV Vmax:       1.87 m/s MV Vmean:      90.5 cm/s MV Decel Time: 296 msec MV E velocity: 178.00 cm/s Lonni Nanas MD Electronically signed by Lonni Nanas MD  Signature Date/Time: 12/23/2023/12:30:49 PM    Final    EP STUDY Result Date: 12/23/2023 See surgical note for result.  NM Pulmonary Perfusion Result Date: 12/16/2023 CLINICAL DATA:  right heart failure, recent surgery, EXAM: NUCLEAR MEDICINE PERFUSION LUNG SCAN TECHNIQUE: Perfusion images were obtained in multiple projections after intravenous injection of radiopharmaceutical. No ventilation imaging performed. RADIOPHARMACEUTICALS:  4.0 mCi Tc-18m MAA IV COMPARISON:  Chest radiographs 12/16/2023 and 12/14/2023. Nuclear medicine perfusion scans 08/27/2023 and 11/01/2016. FINDINGS: Mildly heterogeneous left perihilar perfusion, similar to previous lung scan. No new or wedge-shaped perfusion defects are demonstrated to suggest acute pulmonary embolism. The right lung perfusion appears normal. IMPRESSION: No evidence of acute pulmonary embolism on perfusion scintigraphy by PISAPED criteria. Mildly heterogeneous left perihilar perfusion, similar to previous examination. Electronically Signed   By: Elsie Perone M.D.   On: 12/16/2023 13:06   DG Chest 1 View Result Date: 12/16/2023 CLINICAL DATA:  For comparison with the 2 scan EXAM: CHEST  1 VIEW COMPARISON:  12/14/2023 FINDINGS: Cardiomegaly. No confluent airspace opacities, effusions or overt edema. No acute bony abnormality. IMPRESSION: Stable cardiomegaly.  No active disease. Electronically Signed   By: Franky Crease M.D.   On: 12/16/2023 12:19   ECHOCARDIOGRAM COMPLETE Result Date: 12/15/2023    ECHOCARDIOGRAM REPORT   Patient Name:   Dana [name] Date of Exam: 12/15/2023 Medical Rec #:  [id number]      Height:       65.0 in Accession #:    [id number]     Weight:       216.0 lb Date of Birth:  1961/03/13      BSA:          2.044 m Patient Age:    62 years       BP:           134/52 mmHg Patient Gender: F              HR:           76 bpm. Exam Location:  Inpatient Procedure: 2D Echo, Cardiac Doppler and Color Doppler (Both Spectral and Color  Flow  Doppler were utilized during procedure). Indications:    Bacteremia  History:        Patient has prior history of Echocardiogram examinations, most                 recent 08/27/2023.  Sonographer:    Eva Lash Referring Phys: [id number] TIMOTHY S OPYD  Sonographer Comments: Technically difficult study due to poor echo windows, suboptimal parasternal window and suboptimal subcostal window. IMPRESSIONS  1. Left ventricular ejection fraction, by estimation, is 40 to 45%. The left ventricle has mildly decreased function. The left ventricle demonstrates global hypokinesis. There is mild left ventricular hypertrophy. Left ventricular diastolic function could not be evaluated. There is the interventricular septum is flattened in systole and diastole, consistent with right ventricular pressure and volume overload.  2. Right ventricular systolic function is low normal. The right ventricular size is enlarged. There is severely elevated pulmonary artery systolic pressure. The estimated right ventricular systolic pressure is 65.7 mmHg.  3. Left atrial size was mildly dilated.  4. S/P mitral clip, mild to moderate MR, no valvular stenosis (MG 5 mmHg, HR 72bpm). . The mean mitral valve gradient is 5.0 mmHg with average heart rate of 72 bpm.  5. Tricuspid valve regurgitation is moderate.  6. The aortic valve is tricuspid. Aortic valve regurgitation is not visualized. Aortic valve sclerosis is present, with no evidence of aortic valve stenosis.  7. Pulmonic valve regurgitation is moderate.  8. The inferior vena cava is dilated in size with <50% respiratory variability, suggesting right atrial pressure of 15 mmHg. Comparison(s): A prior study was performed on 08/27/2023. Right ventricular size is now enlarged, RVSP suggestive of severely elevated pulmonary pressures, interventricular septum is flattened in systole and diastole, consistent with right ventricular pressure and volume overload, RAP is now .  Conclusion(s)/Recommendation(s): No evidence of valvular vegetations on this transthoracic echocardiogram. Consider a transesophageal echocardiogram to exclude infective endocarditis if clinically indicated. Given the dilated RV, elevated RVSP, and low normal systolic function, rule out pulmonary embolism, if clinically indicated. FINDINGS  Left Ventricle: Left ventricular ejection fraction, by estimation, is 40 to 45%. The left ventricle has mildly decreased function. The left ventricle demonstrates global hypokinesis. Definity  contrast agent was given IV to delineate the left ventricular  endocardial borders. The left ventricular internal cavity size was normal in size. There is mild left ventricular hypertrophy. The interventricular septum is flattened in systole and diastole, consistent with right ventricular pressure and volume overload. Left ventricular diastolic function could not be evaluated due to mitral valve repair. Left ventricular diastolic function could not be evaluated. Right Ventricle: The right ventricular size is enlarged. Right vetricular wall thickness was not well visualized. Right ventricular systolic function is low normal. There is severely elevated pulmonary artery systolic pressure. The tricuspid regurgitant velocity is 3.56 m/s, and with an assumed right atrial pressure of 15 mmHg, the estimated right ventricular systolic pressure is 65.7 mmHg. Left Atrium: Left atrial size was mildly dilated. Right Atrium: Right atrial size was not well visualized. Pericardium: There is no evidence of pericardial effusion. Mitral Valve: S/P mitral clip, mild to moderate MR, no valvular stenosis (MG 5 mmHg, HR 72bpm). MV peak gradient, 16.8 mmHg. The mean mitral valve gradient is 5.0 mmHg with average heart rate of 72 bpm. Tricuspid Valve: The tricuspid valve is normal in structure. Tricuspid valve regurgitation is moderate . No evidence of tricuspid stenosis. Aortic Valve: The aortic valve is tricuspid.  Aortic valve regurgitation is not visualized. Aortic  valve sclerosis is present, with no evidence of aortic valve stenosis. Aortic valve mean gradient measures 3.0 mmHg. Aortic valve peak gradient measures 6.0  mmHg. Aortic valve area, by VTI measures 1.81 cm. Pulmonic Valve: The pulmonic valve was normal in structure. Pulmonic valve regurgitation is moderate. No evidence of pulmonic stenosis. Aorta: The aortic root and ascending aorta are structurally normal, with no evidence of dilitation. Venous: The inferior vena cava is dilated in size with less than 50% respiratory variability, suggesting right atrial pressure of 15 mmHg. IAS/Shunts: The interatrial septum was not well visualized.  LEFT VENTRICLE PLAX 2D LVIDd:         5.10 cm      Diastology LVIDs:         3.50 cm      LV e' medial: 3.26 cm/s LV PW:         1.10 cm LV IVS:        1.10 cm LVOT diam:     1.80 cm LV SV:         42 LV SV Index:   20 LVOT Area:     2.54 cm  LV Volumes (MOD) LV vol d, MOD A2C: 196.0 ml LV vol d, MOD A4C: 210.0 ml LV vol s, MOD A2C: 99.5 ml LV vol s, MOD A4C: 105.0 ml LV SV MOD A2C:     96.5 ml LV SV MOD A4C:     210.0 ml LV SV MOD BP:      104.4 ml RIGHT VENTRICLE RV S prime:     9.14 cm/s TAPSE (M-mode): 1.7 cm LEFT ATRIUM             Index LA Vol (A2C):   77.5 ml 37.91 ml/m LA Vol (A4C):   62.3 ml 30.48 ml/m LA Biplane Vol: 73.9 ml 36.15 ml/m  AORTIC VALVE AV Area (Vmax):    1.76 cm AV Area (Vmean):   1.74 cm AV Area (VTI):     1.81 cm AV Vmax:           122.00 cm/s AV Vmean:          72.300 cm/s AV VTI:            0.231 m AV Peak Grad:      6.0 mmHg AV Mean Grad:      3.0 mmHg LVOT Vmax:         84.20 cm/s LVOT Vmean:        49.300 cm/s LVOT VTI:          0.164 m LVOT/AV VTI ratio: 0.71  AORTA Ao Sinus diam: 2.90 cm Ao Asc diam:   3.10 cm MITRAL VALVE              TRICUSPID VALVE MV Area VTI:  0.82 cm    TR Peak grad:   50.7 mmHg MV Peak grad: 16.8 mmHg   TR Vmax:        356.00 cm/s MV Mean grad: 5.0 mmHg MV Vmax:       2.05 m/s    SHUNTS MV Vmean:     99.4 cm/s   Systemic VTI:  0.16 m MR Peak grad: 101.6 mmHg  Systemic Diam: 1.80 cm MR Mean grad: 65.0 mmHg MR Vmax:      504.00 cm/s MR Vmean:     378.0 cm/s Sunit Tolia Electronically signed by Madonna Large Signature Date/Time: 12/15/2023/8:02:30 PM    Final    VAS US  LOWER EXTREMITY VENOUS (DVT) Result  Date: 12/15/2023  Lower Venous DVT Study Patient Name:  Dana [name]  Date of Exam:   12/15/2023 Medical Rec #: [id number]       Accession #:    [id number] Date of Birth: Dec 11, 1960       Patient Gender: F Patient Age:   50 years [facility]am [facility] Procedure:      VAS US  LOWER EXTREMITY VENOUS (DVT) Referring Phys: DAWOOD ELGERGAWY --------------------------------------------------------------------------------  Indications: Edema.  Risk Factors: None identified. Limitations: Poor ultrasound/tissue interface and patient pain tolerance. Comparison Study: No prior studies. Performing Technologist: Cordella Collet RVT  Examination Guidelines: A complete evaluation includes B-mode imaging, spectral Doppler, color Doppler, and power Doppler as needed of all accessible portions of each vessel. Bilateral testing is considered an integral part of a complete examination. Limited examinations for reoccurring indications may be performed as noted. The reflux portion of the exam is performed with the patient in reverse Trendelenburg.  +---------+---------------+---------+-----------+----------+--------------+ RIGHT    CompressibilityPhasicitySpontaneityPropertiesThrombus Aging +---------+---------------+---------+-----------+----------+--------------+ CFV      Full           Yes      Yes                                 +---------+---------------+---------+-----------+----------+--------------+ SFJ      Full                                                        +---------+---------------+---------+-----------+----------+--------------+ FV Prox  Full                                                         +---------+---------------+---------+-----------+----------+--------------+ FV Mid   Full                                                        +---------+---------------+---------+-----------+----------+--------------+ FV Distal               Yes      Yes                                 +---------+---------------+---------+-----------+----------+--------------+ PFV      Full                                                        +---------+---------------+---------+-----------+----------+--------------+ POP      Full           Yes      Yes                                 +---------+---------------+---------+-----------+----------+--------------+ PTV      Full                                                        +---------+---------------+---------+-----------+----------+--------------+  PERO     Full                                                        +---------+---------------+---------+-----------+----------+--------------+   +---------+---------------+---------+-----------+----------+-------------------+ LEFT     CompressibilityPhasicitySpontaneityPropertiesThrombus Aging      +---------+---------------+---------+-----------+----------+-------------------+ CFV      Full           Yes      Yes                                      +---------+---------------+---------+-----------+----------+-------------------+ SFJ      Full                                                             +---------+---------------+---------+-----------+----------+-------------------+ FV Prox  Full                                                             +---------+---------------+---------+-----------+----------+-------------------+ FV Mid                  Yes      Yes                                      +---------+---------------+---------+-----------+----------+-------------------+ FV Distal                Yes      Yes                                      +---------+---------------+---------+-----------+----------+-------------------+ PFV      Full                                                             +---------+---------------+---------+-----------+----------+-------------------+ POP      Full           Yes      Yes                                      +---------+---------------+---------+-----------+----------+-------------------+ PTV      Full                                                             +---------+---------------+---------+-----------+----------+-------------------+ PERO  Not well visualized +---------+---------------+---------+-----------+----------+-------------------+     Summary: RIGHT: - There is no evidence of deep vein thrombosis in the lower extremity. However, portions of this examination were limited- see technologist comments above.  - No cystic structure found in the popliteal fossa.  LEFT: - There is no evidence of deep vein thrombosis in the lower extremity. However, portions of this examination were limited- see technologist comments above.  - No cystic structure found in the popliteal fossa.  *See table(s) above for measurements and observations. Electronically signed by Lonni Gaskins MD on 12/15/2023 at 12:20:09 PM.    Final    US  PELVIS (TRANSABDOMINAL ONLY) Result Date: 12/14/2023 CLINICAL DATA:  Pelvic inflammatory disease EXAM: TRANSABDOMINAL ULTRASOUND OF PELVIS TECHNIQUE: Transabdominal ultrasound examination of the pelvis was performed including evaluation of the uterus, ovaries, adnexal regions, and pelvic cul-de-sac. COMPARISON:  CT 12/14/2023 FINDINGS: Uterus Absent Right ovary Not visualized.  No definite adnexal mass identified. Left ovary Not visualized.  No definite adnexal mass identified. Other findings: Trace simple appearing free fluid within the right adnexa best  seen on cine images. IMPRESSION: 1. Status post hysterectomy. 2. Nonvisualization of the ovaries bilaterally. If indicated, pelvic MRI examination with contrast may be helpful for further evaluation. 3. Trace simple appearing free fluid within the right adnexa, nonspecific. Electronically Signed   By: Dorethia Molt M.D.   On: 12/14/2023 19:23   CT RENAL STONE STUDY Result Date: 12/14/2023 CLINICAL DATA:  Sepsis with bilateral pyelonephritis. Evaluate for hydronephrosis. EXAM: CT ABDOMEN AND PELVIS WITHOUT CONTRAST TECHNIQUE: Multidetector CT imaging of the abdomen and pelvis was performed following the standard protocol without IV contrast. RADIATION DOSE REDUCTION: This exam was performed according to the departmental dose-optimization program which includes automated exposure control, adjustment of the mA and/or kV according to patient size and/or use of iterative reconstruction technique. COMPARISON:  CT abdomen and pelvis 12/13/2023. FINDINGS: Lower chest: There is atelectasis in the lung bases. There are trace bilateral pleural effusions. The heart is enlarged. Hepatobiliary: No focal liver abnormality is seen. Status post cholecystectomy. No biliary dilatation. Pancreas: Unremarkable. No pancreatic ductal dilatation or surrounding inflammatory changes. Spleen: Mildly enlarged. Adrenals/Urinary Tract: Adrenal glands are unremarkable. Kidneys are normal, without renal calculi, focal lesion, or hydronephrosis. There is mild nonspecific bilateral perinephric fat stranding, mildly increased. Bladder is unremarkable. Stomach/Bowel: Stomach is within normal limits. Appendix is not seen. No evidence of bowel wall thickening, distention, or inflammatory changes. Vascular/Lymphatic: Aortic atherosclerosis. No enlarged abdominal or pelvic lymph nodes. There are extensive vascular calcifications the abdomen and pelvis. Reproductive: Uterus is surgically absent. The left ovary appears enlarged with surrounding  inflammatory stranding and fluid, a new finding. The right ovary appears within normal limits. Other: There is mild body wall edema which has increased. There is no ascites, but there is increasing presacral edema. Medication injection sites are seen in the subcutaneous tissues of the lower anterior abdominal wall. Musculoskeletal: No fracture is seen. IMPRESSION: 1. No hydronephrosis. 2. Mild nonspecific bilateral perinephric fat stranding, mildly increased. Correlate clinically for infection. 3. Enlarged left ovary with surrounding inflammatory stranding and fluid, a new finding. Findings may be related to ovarian torsion or infection. Recommend further evaluation with pelvic ultrasound. 4. Increasing body wall edema and presacral edema. 5. Trace bilateral pleural effusions. 6. Cardiomegaly. 7. Mild splenomegaly. 8. Aortic atherosclerosis. Aortic Atherosclerosis (ICD10-I70.0). Electronically Signed   By: Greig Pique M.D.   On: 12/14/2023 16:54   DG Ankle Complete Left Result Date: 12/14/2023 CLINICAL DATA:  Surgical site  infection. EXAM: LEFT ANKLE COMPLETE - 3 VIEW COMPARISON:  12/13/2023 FINDINGS: Postop changes with subtalar fusion. Bimalleolar fractures. Ankle mortise disruption. Regional soft tissue swelling. IMPRESSION: Postop changes. Disrupted ankle mortise. Displaced fractures. Soft tissue swelling. Electronically Signed   By: Fonda Field M.D.   On: 12/14/2023 08:30   DG CHEST PORT 1 VIEW Result Date: 12/14/2023 CLINICAL DATA:  Surgical site infection.  CVC placement. EXAM: PORTABLE CHEST 1 VIEW COMPARISON:  12/13/2023. FINDINGS: Prominent cardiac silhouette. Pulmonary vascular congestion. No focal consolidation. No pneumothorax or pleural effusion. Right IJ CVC tip overlies distal SVC. IMPRESSION: Prominent cardiac silhouette and vascular congestion. Electronically Signed   By: Fonda Field M.D.   On: 12/14/2023 08:28    Labs:  Basic Metabolic Panel: Recent Labs  Lab 01/02/24 0510  01/02/24 9488 01/03/24 0622 01/04/24 0718 01/05/24 0610 01/06/24 0513 01/07/24 0500 01/08/24 0455  NA 134* 133* 134* 132* 134* 131* 133* 134*  K 4.8 4.8 4.6 3.9 4.8 4.6 5.0 4.6  CL 99 99 96* 99 99 101 102 101  CO2 22 22 21* 25 25 24  21* 22  GLUCOSE 129* 129* 109* 103* 123* 125* 119* 144*  BUN 64* 64* 64* 64* 66* 64* 60* 56*  CREATININE 3.61* 3.57* 3.54* 3.36* 3.29* 3.18* 3.27* 3.33*  CALCIUM  8.7* 8.6* 8.6* 8.3* 8.4* 8.0* 8.4* 8.4*  PHOS  --  5.4* 4.9* 4.9*  --   --  4.2 4.3    CBC: Recent Labs  Lab 01/05/24 0610 01/06/24 0513 01/08/24 1529  WBC 8.9 8.5 10.1  NEUTROABS 6.4  --   --   HGB 7.5* 7.1* 8.2*  HCT 24.2* 23.2* 26.4*  MCV 89.3 89.9 90.1  PLT 272 246 301    CBG: Recent Labs  Lab 01/07/24 2133 01/08/24 0545 01/08/24 1134 01/08/24 1632 01/08/24 2143  GLUCAP 179* 144* 147* 124* 110*   Family history.  Mother with breast cancer CAD hypertension hyperlipidemia as well as diabetes mellitus and lung cancer Father diabetes mellitus and hypertension.  Denies any colon cancer esophageal cancer or rectal cancer  Brief HPI:   NAVIE LAMOREAUX is a 63 y.o. right-handed female with history significant for hyperlipidemia CVA insulin -dependent diabetes mellitus CKD stage IV CAD with stenting/angioplasty as well as mitral valve repair maintained on low-dose aspirin  and Brilinta  chronic hypoxic respiratory failure OSA on CPAP and quit smoking 41 years ago, chronic diastolic congestive heart failure depression with anxiety chronic pain left trimalleolar ankle fracture dislocation status post repair 11/13/2023.  Per chart review patient lives with parent.  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  She is able to live on the main level of the home.  There is a ramped entrance.  Presented 12/14/2023 from [facility] after presenting to the ED 5/29 with fevers dysuria and flank pain.  She was started on IV fluids and  broad-spectrum antibiotics for suspected UTI with CT abdomen pelvis showing bladder wall thickening on CT as well as noted to have purulent drainage from left heel wound where cultures grew MRSA.  She was transferred to [facility].  Blood culture showed no growth, lactic acid within normal limits BUN 50 creatinine 2.25 hemoglobin 7.2 WBC 19,600.  Infectious disease consulted placed on vancomycin .  Chest x-ray showed prominent cardiac silhouette and vascular congestion.  CT renal stone study showed no hydronephrosis.  Enlarged left ovary with surrounding inflammatory stranding and fluid.  Transabdominal ultrasound of the pelvis showed nonvisualization  of the ovaries bilaterally.  Trace simple appearing free fluid within the right adnexa, nonspecific.  Nuclear medicine perfusion lung scan no evidence of acute pulmonary emboli.  Venous Doppler studies negative for DVT.  Dr. Harden follow-up left heel wound x-ray showed postoperative changes disruptive ankle morrtise soft tissue swelling.  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 Harden.  Wound VAC applied.  Hospital course acute on chronic anemia hemoglobin 8.8.  Maintained on daptomycin  x 5 days postoperatively.  Leukocytosis improved to 12,600 latest renal function creatinine 2.31.  She was cleared to begin subcutaneous heparin  for DVT prophylaxis.  Her aspirin  and Brilinta  were resumed postoperatively.  She did receive psychiatry consult during hospitalization for depression and remained on Ativan  as needed as well as Lexapro /BuSpar .  Therapy evaluations completed due to patient's decreased functional mobility was admitted for a comprehensive rehab program.   Hospital Course: Rockie G Triggs was admitted to rehab 12/27/2023 for inpatient therapies to consist of PT, ST and OT at least three hours five days a week. Past admission physiatrist, therapy team and rehab RN have worked  together to provide customized collaborative inpatient rehab.  Pertaining to patient's left BKA 12/18/2023 after postoperative left ankle infection remained stable follow-up orthopedic services Dr. Harden with wound reviewed by Dr. Harden that showed to be well-approximated no drainage from the wound.  There was some noted superficial ulcers lateral to the knee and anterior to the tibial tubercle and felt they should resolve uneventfully.  Recommend she continue to wear a black stump shrinker against the skin at all times..  She did complete a 5-day course of daptomycin  through 12/28/2023 for MRSA bacteremia.  Subcutaneous heparin  for DVT prophylaxis.  History of CAD with stenting continue on aspirin  and Brilinta  as prior to admission.  Pain management with Neurontin  as well as oxycodone  noted chronic back pain with left lumbar radiculopathy.  Mood stabilization with Lexapro  as well as BuSpar  mood remained positive.  Blood sugars monitored hemoglobin A1c 10.2 insulin  therapy as directed diabetic teaching.  Diastolic congestive heart failure with Coreg  held and diuretic had been held due to soft blood pressure and monitoring for any signs of fluid overload followed by cardiology services.  Protonix  ongoing for GERD.  Zetia  maintained for hyperlipidemia.  Chronic hypoxic respiratory failure OSA continued CPAP monitoring every shift with oxygen  saturations.  CKD stage IV baseline creatinine 2.2-2.4 mild bump in creatinine 2.7 monitoring diuretic resumed at low-dose and followed up per nephrology services.  History of gout maintained on allopurinol  monitoring for any signs of gout flareup.  Urine culture 01/03/2024 showed 10,000 colony count Klebsiella as well as E. coli completing course of Keflex .  No dysuria or hematuria.   Blood pressures were monitored on TID basis and remained controlled and monitored  [employer] Information    Media Information   Document Information  Photos  L leg   01/08/2024 11:41  Attached To:  Hospital Encounter on 12/27/23  Source Information  Montanti, Almeda CROME, RN  [facility]   Document Information  Photos  L leg  01/08/2024 11:41  Attached To:  Hospital Encounter on 12/27/23  Source Information  Montanti, Almeda CROME, RN  [facility]   Document Information  Photos  Left leg  01/08/2024 11:41  Attached To:  Hospital Encounter on 12/27/23  Source Information  Montanti, Almeda CROME, RN  [facility]   Media Information   Document Information  Photos  Left leg  01/08/2024 11:41  Attached To:  Hospital Encounter on 12/27/23  Source Information  Montanti, Almeda CROME, RN  [facility]  abetes has been monitored with ac/hs CBG checks and SSI was use prn for tighter BS control.    Rehab course: During patient's stay in rehab weekly team conferences were held to monitor patient's progress, set goals and discuss barriers to discharge. At admission, patient required moderate assist squat pivot transfers minimal assist lateral scoot transfers  He/She  has had improvement in activity tolerance, balance, postural control as well as ability to compensate for deficits. He/She has had improvement in functional use RUE/LUE  and RLE/LLE as well as improvement in awareness.  Patient performed bed mobility with supervision/modified independent with the use of bed features.  Slide board transfers with contact-guard/close supervision.  Sit to stand from elevated surface rolling walker min mod assist.  Minimal assist for wheelchair propulsion.  Patient completed core strengthening with dynamic sitting balance on mat table to increase ability to slide board transfer with increased safety.  Patient completed multiple transfers to and from mat, wheelchair bedside commode during sessions contact-guard.  Full family teaching completed plan discharge to home       Disposition:  Discharge disposition: 06-Home-Health Care  Svc        Diet: Diabetic diet  Special Instructions: No driving smoking or alcohol  Continue stump shrinker as directed  Medications at discharge. 1.  Tylenol  as needed 2.  Zyloprim  50 mg p.o. daily 3.  Aspirin  81 mg p.o. daily 4.  BuSpar  15 mg p.o. 3 times daily 5.  Omega-3 fatty acid 1 capsule daily 6.  Lidoderm  patch changes directed 7.  Zetia  10 mg p.o. daily 8.  Neurontin  300 mg p.o. 3 times daily 9.  Protonix  40 mg p.o. daily 10.  Brilinta  90 mg p.o. twice daily 11.  Repatha  140 mg every 14 days 12.  Lantus  insulin  5 units daily 13.  NovoLog  2-10 units before meals based on SSI 14.  Oxycodone  5 mg every 4 hours as needed pain 15.Xalatan  ophthalmic solution 0.005% 1 drop both eyes bedtime 16.  NovoLog  FlexPen 2-10 units before meals based on sliding scale insulin  17.  Linzess  145 mcg daily 18.  Ativan  0.5 mg daily as needed 19.  Magnesium  oxide 800 mg daily 20.  Nitroglycerin  as needed 21.  Keflex  500 mg every 8 hours 22.  Vitamin D  2000 units twice daily 23.  MiraLAX  daily.  Hold for loose stools 24.  Voltaren gel 2 g 4 times daily to affected area 25.  Demadex  20 mg daily   30-35 minutes are spent completing discharge summary and discharge planning Discharge Instructions     Ambulatory referral to Physical Medicine Rehab   Complete by: As directed    Moderate complexity follow-up 1 to 2 weeks left BKA        Follow-up Information     Urbano Albright, MD Follow up.   Specialty: Physical Medicine and Rehabilitation Why: Office to call for appointment Contact information: [address] [place] [address] [phone]         Harden Jerona GAILS, MD Follow up.   Specialty: Orthopedic Surgery Why: Call for appointment Contact information: [address] [place] [address] [phone]         Bensimhon, Toribio SAUNDERS, MD Follow up.   Specialty: Cardiology Why: Call for appointment Contact  information: [address] [place] [address] [phone]         Macel Jayson PARAS, MD Follow up.   Specialty: Internal Medicine Why: Call for appointment Contact information: [address] [place] [address] [phone]                 Signed: Toribio PARAS Pitch 01/09/2024, 4:37 AM

## 2023-12-30 ENCOUNTER — Inpatient Hospital Stay (HOSPITAL_COMMUNITY)

## 2023-12-30 DIAGNOSIS — I5032 Chronic diastolic (congestive) heart failure: Secondary | ICD-10-CM | POA: Diagnosis not present

## 2023-12-30 DIAGNOSIS — Z89512 Acquired absence of left leg below knee: Secondary | ICD-10-CM | POA: Diagnosis not present

## 2023-12-30 DIAGNOSIS — N184 Chronic kidney disease, stage 4 (severe): Secondary | ICD-10-CM | POA: Diagnosis not present

## 2023-12-30 DIAGNOSIS — D649 Anemia, unspecified: Secondary | ICD-10-CM

## 2023-12-30 LAB — COMPREHENSIVE METABOLIC PANEL WITH GFR
ALT: 7 U/L (ref 0–44)
AST: 11 U/L — ABNORMAL LOW (ref 15–41)
Albumin: 2.1 g/dL — ABNORMAL LOW (ref 3.5–5.0)
Alkaline Phosphatase: 95 U/L (ref 38–126)
Anion gap: 12 (ref 5–15)
BUN: 58 mg/dL — ABNORMAL HIGH (ref 6–20)
CO2: 26 mmol/L (ref 22–32)
Calcium: 8.7 mg/dL — ABNORMAL LOW (ref 8.9–10.3)
Chloride: 98 mmol/L (ref 98–111)
Creatinine, Ser: 3.24 mg/dL — ABNORMAL HIGH (ref 0.44–1.00)
GFR, Estimated: 16 mL/min — ABNORMAL LOW (ref >60)
Glucose, Bld: 122 mg/dL — ABNORMAL HIGH (ref 70–99)
Potassium: 4.4 mmol/L (ref 3.5–5.1)
Sodium: 136 mmol/L (ref 135–145)
Total Bilirubin: 1.1 mg/dL (ref 0.0–1.2)
Total Protein: 6.9 g/dL (ref 6.5–8.1)

## 2023-12-30 LAB — IRON AND TIBC
Iron: 32 ug/dL (ref 28–170)
Saturation Ratios: 14 % (ref 10.4–31.8)
TIBC: 234 ug/dL — ABNORMAL LOW (ref 250–450)
UIBC: 202 ug/dL

## 2023-12-30 LAB — GLUCOSE, CAPILLARY
Glucose-Capillary: 121 mg/dL — ABNORMAL HIGH (ref 70–99)
Glucose-Capillary: 152 mg/dL — ABNORMAL HIGH (ref 70–99)
Glucose-Capillary: 169 mg/dL — ABNORMAL HIGH (ref 70–99)
Glucose-Capillary: 117 mg/dL — ABNORMAL HIGH (ref 70–99)

## 2023-12-30 LAB — CBC WITH DIFFERENTIAL/PLATELET
Abs Immature Granulocytes: 0.04 10*3/uL (ref 0.00–0.07)
Basophils Absolute: 0.1 10*3/uL (ref 0.0–0.1)
Basophils Relative: 1 %
Eosinophils Absolute: 0.1 10*3/uL (ref 0.0–0.5)
Eosinophils Relative: 1 %
HCT: 24.1 % — ABNORMAL LOW (ref 36.0–46.0)
Hemoglobin: 7.4 g/dL — ABNORMAL LOW (ref 12.0–15.0)
Immature Granulocytes: 0 %
Lymphocytes Relative: 12 %
Lymphs Abs: 1.3 10*3/uL (ref 0.7–4.0)
MCH: 27.3 pg (ref 26.0–34.0)
MCHC: 30.7 g/dL (ref 30.0–36.0)
MCV: 88.9 fL (ref 80.0–100.0)
Monocytes Absolute: 0.7 10*3/uL (ref 0.1–1.0)
Monocytes Relative: 7 %
Neutro Abs: 8.3 10*3/uL — ABNORMAL HIGH (ref 1.7–7.7)
Neutrophils Relative %: 79 %
Platelets: 292 10*3/uL (ref 150–400)
RBC: 2.71 MIL/uL — ABNORMAL LOW (ref 3.87–5.11)
RDW: 17.1 % — ABNORMAL HIGH (ref 11.5–15.5)
WBC: 10.5 10*3/uL (ref 4.0–10.5)
nRBC: 0 % (ref 0.0–0.2)

## 2023-12-30 LAB — FERRITIN: Ferritin: 415 ng/mL — ABNORMAL HIGH (ref 11–307)

## 2023-12-30 MED ORDER — CARVEDILOL 3.125 MG PO TABS
3.1250 mg | ORAL_TABLET | Freq: Two times a day (BID) | ORAL | Status: DC
  Filled 2023-12-30: qty 1

## 2023-12-30 NOTE — Progress Notes (Signed)
 Inpatient Rehabilitation Center Individual Statement of Services  Patient Name:  Dana [name]  Date:  12/30/2023  Welcome to the Inpatient Rehabilitation Center.  Our goal is to provide you with an individualized program based on your diagnosis and situation, designed to meet your specific needs.  With this comprehensive rehabilitation program, you will be expected to participate in at least 3 hours of rehabilitation therapies Monday-Friday, with modified therapy programming on the weekends.  Your rehabilitation program will include the following services:  Physical Therapy (PT), Occupational Therapy (OT), 24 hour per day rehabilitation nursing, Therapeutic Recreaction (TR), Care Coordinator, Rehabilitation Medicine, Nutrition Services, and Pharmacy Services  Weekly team conferences will be held on Wednesday to discuss your progress.  Your Inpatient Rehabilitation Care Coordinator will talk with you frequently to get your input and to update you on team discussions.  Team conferences with you and your family in attendance may also be held.  Expected length of stay: 10-12 days  Overall anticipated outcome: independent device-min-10 ft  Depending on your progress and recovery, your program may change. Your Inpatient Rehabilitation Care Coordinator will coordinate services and will keep you informed of any changes. Your Inpatient Rehabilitation Care Coordinator's name and contact numbers are listed  below.  The following services may also be recommended but are not provided by the Inpatient Rehabilitation Center:  Driving Evaluations Home Health Rehabiltiation Services Outpatient Rehabilitation Services    Arrangements will be made to provide these services after discharge if needed.  Arrangements include referral to agencies that provide these services.  Your insurance has been verified to be:  Health team advantage Your primary doctor is:  Kirsten Cox  Pertinent information will be shared  with your doctor and your insurance company.  Inpatient Rehabilitation Care Coordinator:  Adrianna Albee, [place] [phone] or [phone]  Information discussed with and copy given to patient by: Mardell Shade, 12/30/2023, 9:30 AM

## 2023-12-30 NOTE — Progress Notes (Signed)
 Physical Therapy Session Note  Patient Details  Name: Dana [name] MRN: [id number] Date of Birth: [birth year]  Today's Date: 12/30/2023 PT Individual Time: 0915-1029 PT Individual Time Calculation (min): 74 min   Short Term Goals: Week 1:  PT Short Term Goal 1 (Week 1): Pt will complete bed mobility with distant supervision PT Short Term Goal 2 (Week 1): Pt will complete bed > chair transfer with CGA + LRAD PT Short Term Goal 3 (Week 1): Pt will complete sit to stand with modA + LRAD PT Short Term Goal 4 (Week 1): Pt will complete 78ft WC propulsion with minA  Skilled Therapeutic Interventions/Progress Updates:      Pt supine in bed upon arrival. Pt agreeable to therapy. Pt reports 5/10 L LE incision pain, premedicated. Therapist provided rest breaks and repsoitioning.   Therapist managing wound vac and foley throughout session.   Pt with significant bend in L knee 2/2 features of bed. Education provided regarding positioning in bed with emphasis on knee extension for contracture prevention and future prosthetic fitting. Education rpovided for how to flatten lower portion of bed after elevating HOB to prevent fold of bed.   Education provided regarding purpose of shinker and technique with donningl/doffing. Pt verbalized understanding. Pt provided teach back of education.   Education provided regarding purpose of limb protector and technique with donning and doffing. Education provided regarding technique with manging velcro straps with doffing.   Pt donned R LE ted hose.   Pt requesting to change clothes. Pt seated EOB doffed gown, and donned shirt with set up assist. Pt donned pants with max A with combination of lateral trunk lean technique sitting EOB and rolling B in bed.   Pt performed down hill slide board transfer bed to [facility] with min A, verbal and tactile cues provided for head hip ratio.   Pt donned leg rests with assist, verbal/tactile cues and demonstration provided.    Pt performed the following therex for B LE/UE strengthening/ROM/contracture prevention:   1x10 L LE quad sets holding 5   1x10 glute sets holding 5   2x5 wheelchair push ups  Provided pt with new WC 2/2 broken attachment for R LE leg rest. Notified following OT 2/2 time constraints.   Pt seated in [facility] with all needs within reach and seatbelt alarm on.      Therapy Documentation Precautions:  Precautions Precautions: Fall, Other (comment) Recall of Precautions/Restrictions: Intact Precaution/Restrictions Comments: LLE would vac, L limb protector Restrictions Weight Bearing Restrictions Per Provider Order: Yes LLE Weight Bearing Per Provider Order: Non weight bearing  Therapy/Group: Individual Therapy  [facility] Jenney Modest, [place], DPT  12/30/2023, 7:55 AM

## 2023-12-30 NOTE — Progress Notes (Signed)
 Patient ID: JAQUANNA BALLENTINE, female   DOB: May 30, 1961, 63 y.o.   MRN: [id number] Met with the patient to review current medical situation, rehab process, team conference and plan of care. Discussed secondary risk management including HF, CAD and HTN, HLD with DM. Patient noted pain but denied phantom pain, managed with prns. Reviewed residual limb care, shrinker and ampu-shield after wound vac removed.  Patient noted ativan  at HS PTA; on profile prn along with other meds for depression and anxiety. CPAP at bedside (OSA) Continue with foley (placed 12/26/23) for retention and meds for constipation. Continue to follow along to address educational needs to facilitate preparation for discharge. Naoma Bacca

## 2023-12-30 NOTE — Progress Notes (Signed)
 Inpatient Rehabilitation Care Coordinator Assessment and Plan Patient Details  Name: Dana [name] MRN: [id number] Date of Birth: 1960-12-04  Today's Date: 12/30/2023  Hospital Problems: Principal Problem:   Left below-knee amputee [facility])  Past Medical History:  Past Medical History:  Diagnosis Date   Acquired absence of left great toe (HCC)    Acquired absence of other left toe(s) (HCC)    ACS (acute coronary syndrome) (HCC)    Acute bronchitis due to COVID-19 virus 03/03/2023   Acute on chronic combined systolic and diastolic CHF (congestive heart failure) (HCC) 09/16/2023   Acute respiratory failure with hypoxia (HCC) 07/20/2023   Anemia    Anoxic brain injury (HCC)    Anxiety    CAD (coronary artery disease)    DES mLAD 04/07/15; DES dRCA & DES pPDA 08/30/16; DES mCX & PTCA OM1 09/29/20   Charcot's joint of foot in type 2 diabetes mellitus (HCC) 12/15/2023   Chronic diastolic (congestive) heart failure (HCC)    Chronic pain    Chronic systolic (congestive) heart failure (HCC)    CKD (chronic kidney disease)    Contrast dye induced nephropathy, possible 09/03/2020   COPD (chronic obstructive pulmonary disease) (HCC)    COVID-19 virus infection 03/03/2023   Demand ischemia (HCC)    Diabetes (HCC)type 1    Diastolic CHF (HCC)    Dyspnea    Encounter for screening mammogram for malignant neoplasm of breast 09/16/2023   Family history of breast cancer 10/23/2021   Gastro-esophageal reflux disease without esophagitis    Hyperlipidemia    Hypertension    Hypertensive heart disease with heart failure (HCC)    Hypoxemia    Idiopathic gout, multiple sites    Leukocytosis 03/29/2022   Localized edema    Low back pain    Mitral regurgitation    Mitral regurgitation    Mixed hyperlipidemia    Mixed incontinence    Morbid (severe) obesity due to excess calories (HCC)    Neuromuscular disorder (HCC)    Neuropathy    Non-ST elevation (NSTEMI) myocardial infarction (HCC)     08/29/20, 09/29/20   OSA (obstructive sleep apnea) 09/03/2020   Other specified hypothyroidism    PAF (paroxysmal atrial fibrillation) (HCC)    s/p pulmonary vein isolation by cryoablation 10/04/15 Dr. Jearldine Mina in [facility]   PEA (Pulseless electrical activity) [facility])    ~ 01/13/21 at [facility] during admission DKA, volume overload, possible CAP, hypoxia s/p ACLS with ROSC ~ 10-15   Pneumonia    Primary insomnia    Rheumatoid arthritis (HCC)    Stroke (HCC)    Thyroid  disease    Type 1 diabetes mellitus (HCC)    URI with cough and congestion 08/22/2023   Vitamin D  deficiency    Past Surgical History:  Past Surgical History:  Procedure Laterality Date   ABDOMINAL HYSTERECTOMY     Partial- Still has both ovaries   AMPUTATION Left 12/18/2023   Procedure: AMPUTATION BELOW KNEE;  Surgeon: Timothy Ford, MD;  Location: [facility] OR;  Service: Orthopedics;  Laterality: Left;   ANKLE FUSION Left 11/13/2023   Procedure: HINDFOOT FUSION FIXATION OF ANKLE;  Surgeon: Laneta Pintos, MD;  Location: [facility] OR;  Service: Orthopedics;  Laterality: Left;   CARDIOVASCULAR STRESS TEST  08/13/2014   Nuclear; Normal   CARPAL TUNNEL RELEASE     CATARACT EXTRACTION, BILATERAL     CESAREAN SECTION     CHOLECYSTECTOMY     CORONARY ANGIOPLASTY WITH STENT PLACEMENT  3 blockages/ 1 stent   CORONARY BALLOON ANGIOPLASTY N/A 07/18/2023   Procedure: CORONARY BALLOON ANGIOPLASTY;  Surgeon: Odie Benne, MD;  Location: [facility] INVASIVE CV LAB;  Service: Cardiovascular;  Laterality: N/A;   CORONARY PRESSURE/FFR STUDY N/A 09/07/2022   Procedure: INTRAVASCULAR PRESSURE WIRE/FFR STUDY;  Surgeon: Swaziland, Peter M, MD;  Location: [facility] INVASIVE CV LAB;  Service: Cardiovascular;  Laterality: N/A;   CORONARY STENT INTERVENTION N/A 09/29/2020   Procedure: CORONARY STENT INTERVENTION;  Surgeon: Arnoldo Lapping, MD;  Location: [facility] INVASIVE CV LAB;  Service: Cardiovascular;  Laterality: N/A;  CFX   CORONARY STENT INTERVENTION N/A  09/07/2022   Procedure: CORONARY STENT INTERVENTION;  Surgeon: Swaziland, Peter M, MD;  Location: [facility] INVASIVE CV LAB;  Service: Cardiovascular;  Laterality: N/A;   CORONARY STENT INTERVENTION N/A 02/13/2023   Procedure: CORONARY STENT INTERVENTION;  Surgeon: Odie Benne, MD;  Location: [facility] INVASIVE CV LAB;  Service: Cardiovascular;  Laterality: N/A;  LAD   CORONARY ULTRASOUND/IVUS N/A 02/13/2023   Procedure: Coronary Ultrasound/IVUS;  Surgeon: Odie Benne, MD;  Location: [facility] INVASIVE CV LAB;  Service: Cardiovascular;  Laterality: N/A;   CORONARY/GRAFT ACUTE MI REVASCULARIZATION N/A 12/08/2022   Procedure: Coronary/Graft Acute MI Revascularization;  Surgeon: Arleen Lacer, MD;  Location: [facility] INVASIVE CV LAB;  Service: Cardiovascular;  Laterality: N/A;   EYE SURGERY     LEFT HEART CATH AND CORONARY ANGIOGRAPHY N/A 09/29/2020   Procedure: LEFT HEART CATH AND CORONARY ANGIOGRAPHY;  Surgeon: Arnoldo Lapping, MD;  Location: [facility] INVASIVE CV LAB;  Service: Cardiovascular;  Laterality: N/A;   LEFT HEART CATH AND CORONARY ANGIOGRAPHY N/A 09/07/2022   Procedure: LEFT HEART CATH AND CORONARY ANGIOGRAPHY;  Surgeon: Swaziland, Peter M, MD;  Location: [facility] INVASIVE CV LAB;  Service: Cardiovascular;  Laterality: N/A;   LEFT HEART CATH AND CORONARY ANGIOGRAPHY N/A 12/08/2022   Procedure: LEFT HEART CATH AND CORONARY ANGIOGRAPHY;  Surgeon: Arleen Lacer, MD;  Location: [facility] INVASIVE CV LAB;  Service: Cardiovascular;  Laterality: N/A;   LEFT HEART CATH AND CORONARY ANGIOGRAPHY N/A 02/13/2023   Procedure: LEFT HEART CATH AND CORONARY ANGIOGRAPHY;  Surgeon: Odie Benne, MD;  [facility] INVASIVE CV LAB;  Service: Cardiovascular;  Laterality: N/A;   LEFT HEART CATH AND CORONARY ANGIOGRAPHY N/A 07/16/2023   Procedure: LEFT HEART CATH AND CORONARY ANGIOGRAPHY;  Surgeon: Odie Benne, MD;  [facility] INVASIVE CV LAB;  Service: Cardiovascular;  Laterality: N/A;   RIGHT HEART CATH  N/A 04/27/2021   Procedure: RIGHT HEART CATH;  Surgeon: Mardell Shade, MD;  Location: [facility] INVASIVE CV LAB;  Service: Cardiovascular;  Laterality: N/A;   RIGHT/LEFT HEART CATH AND CORONARY ANGIOGRAPHY N/A 01/07/2017   Procedure: Right/Left Heart Cath and Coronary Angiography;  Surgeon: Darlis Eisenmenger, MD;  Location: [facility] INVASIVE CV LAB;  Service: Cardiovascular;  Laterality: N/A;   ROTATOR CUFF REPAIR     TEE WITHOUT CARDIOVERSION N/A 04/28/2021   Procedure: TRANSESOPHAGEAL ECHOCARDIOGRAM (TEE);  Surgeon: Mardell Shade, MD;  Location: [facility] ENDOSCOPY;  Service: Cardiovascular;  Laterality: N/A;   TEE WITHOUT CARDIOVERSION N/A 05/18/2021   Procedure: TRANSESOPHAGEAL ECHOCARDIOGRAM (TEE);  Surgeon: Thukkani, Arun K, MD;  Location: [facility] INVASIVE CV LAB;  Service: Cardiovascular;  Laterality: N/A;   TOE AMPUTATION Left    TRANSCATHETER MITRAL EDGE TO EDGE REPAIR N/A 05/18/2021   Procedure: MITRAL VALVE REPAIR;  Surgeon: Kyra Phy, MD;  Location: [facility] INVASIVE CV LAB;  Service: Cardiovascular;  Laterality: N/A;   TRANSESOPHAGEAL ECHOCARDIOGRAM (CATH LAB) N/A 12/23/2023  Procedure: TRANSESOPHAGEAL ECHOCARDIOGRAM;  Surgeon: Wendie Hamburg, MD;  Location: [facility] INVASIVE CV LAB;  Service: Cardiovascular;  Laterality: N/A;   US  ECHOCARDIOGRAPHY  05/2017   Normal   Social History:  reports that she quit smoking about 41 years ago. Her smoking use included cigarettes. She started smoking about 42 years ago. She has been exposed to tobacco smoke. She has never used smokeless tobacco. She reports that she does not drink alcohol and does not use drugs.  Family / Support Systems Marital Status: Married Patient Roles: Spouse, Parent Spouse/Significant Other: Wandalee Gust [phone] Children: Felcia-daughter [phone] Other Supports: mom-86 yo she lives with pt and husband Anticipated Caregiver: husband and Mom Ability/Limitations of Caregiver: Husband works 12 hr shifts and Mom is 73 yo pt needs to be  mod/i to go home and be safe Caregiver Availability: 24/7 Family Dynamics: Close with husand and mom she feels she can learn to adapt and be at wheelchair level at dc. She has been using a wheelchair and other devices due to bad leg prior to admission  Social History Preferred language: English Religion: Christian Cultural Background: NA Education: HS Health Literacy - How often do you need to have someone help you when you read instructions, pamphlets, or other written material from your doctor or pharmacy?: Never Writes: Yes Employment Status: Disabled Marine scientist Issues: NA Guardian/Conservator: None-according to MD pt is capable of making her own decisions while here.   Abuse/Neglect Abuse/Neglect Assessment Can Be Completed: Yes Physical Abuse: Denies Verbal Abuse: Denies Sexual Abuse: Denies Exploitation of patient/patient's resources: Denies Self-Neglect: Denies  Patient response to: Social Isolation - How often do you feel lonely or isolated from those around you?: Never  Emotional Status Pt's affect, behavior and adjustment status: Pt is motivated to improve and recover from her amputation. She voiced she is still here and thankful for this. She will do her best and hopes to make progress here Recent Psychosocial Issues: other health issues Psychiatric History: hx-depression/anxiety takes medications for this but may benefit from seeing neuro-psych while here Substance Abuse History: NA  Patient / Family Perceptions, Expectations & Goals Pt/Family understanding of illness & functional limitations: Pt can explain her amputation and feels the MD's tried to save her leg but was not salvagable. She is ready to move forward and work on becoming independent again Premorbid pt/family roles/activities: wife, daughter, mom, retiree, etc Anticipated changes in roles/activities/participation: resume Pt/family expectations/goals: Pt states:  I hope to do well my  house is small and the wheelchair does not fit in the bathroom  [employer] Agencies: None Premorbid Home Care/DME Agencies: Other (Comment) (wc, rw, tub seat, rollator, cane) Transportation available at discharge: husband Is the patient able to respond to transportation needs?: Yes In the past 12 months, has lack of transportation kept you from medical appointments or from getting medications?: No In the past 12 months, has lack of transportation kept you from meetings, work, or from getting things needed for daily living?: No Resource referrals recommended: Neuropsychology  Discharge Planning Living Arrangements: Spouse/significant other, Parent Support Systems: Spouse/significant other, Children, Parent, Other relatives, Friends/neighbors Type of Residence: Private residence Insurance Resources: Media planner (specify) (Health Team Advantage) Financial Resources: SSD, Family Support Financial Screen Referred: No Living Expenses: Banker Management: Patient, Spouse Does the patient have any problems obtaining your medications?: No Home Management: pt, husband and mom Patient/Family Preliminary Plans: Return home with husband and mom who can be there. her husband works 12hr shifts and can check  on but not provide assist when working. Pt is hopeful can get mod/i wheelchair level and work from there. Aware team conference on wed will update then. Care Coordinator Barriers to Discharge: Inaccessible home environment, Lack of/limited family support Care Coordinator Anticipated Follow Up Needs: HH/OP  Clinical Impression Pleasant female who is motivated to do well and recover. Her husband does work and her elderly mom can be there but not assist at discharge. Will work on discharge needs and update on Wednesday after team conference.  Mardell Shade 12/30/2023, 9:43 AM

## 2023-12-30 NOTE — Progress Notes (Signed)
 Occupational Therapy Session Note  Patient Details  Name: Dana [name] MRN: [id number] Date of Birth: [birth year]  {CHL IP REHAB OT TIME CALCULATIONS:[id number]}   Short Term Goals: Week 1:  OT Short Term Goal 1 (Week 1): Pt will complete sit to stand at sink  at Max A while completing self care task in order to increase functional performance and standing balance. OT Short Term Goal 2 (Week 1): Pt will complete LB dressing while utilizing either lateral leans or sit to stand with Mod A. OT Short Term Goal 3 (Week 1): Pt will don limb guard with Min A demonstrating improved independence with residual limb management.  Skilled Therapeutic Interventions/Progress Updates:    Patient agreeable to participate in OT session. Reports *** pain level.   Patient participated in skilled OT session focusing on ***. Therapist facilitated/assessed/developed/educated/integrated/elicited *** in order to improve/facilitate/promote    Therapy Documentation Precautions:  Precautions Precautions: Fall, Other (comment) Recall of Precautions/Restrictions: Intact Precaution/Restrictions Comments: LLE would vac, L limb protector Restrictions Weight Bearing Restrictions Per Provider Order: Yes LLE Weight Bearing Per Provider Order: Non weight bearing  Therapy/Group: Individual Therapy  [name], OTR/L,CBIS  Supplemental OT - [facility] Secure Chat Preferred   12/30/2023, 5:29 PM

## 2023-12-30 NOTE — Progress Notes (Signed)
 Inpatient Rehabilitation  Patient information reviewed and entered into eRehab system by Feliberto Gottron, M.A., CCC-SLP, Rehab Quality Coordinator.  Information including medical coding, functional ability and quality indicators will be reviewed and updated through discharge.

## 2023-12-30 NOTE — Consult Note (Signed)
 Nephrology Consult   Requesting provider: Lylia Sand Service requesting consult: CIR Reason for consult: AKI on CKD IV   Assessment/Recommendations: Dana [name] is a/an 63 y.o. female with a past medical history M1, CAD, GERD, CHF, COPD, history of cardiac arrest who present w/ gangrenous foot s/p amputation c/b AKI   Nonoliguric AKI on CKD 4: Baseline creatinine 2.2-2.7.  Slight rise in creatinine up to 3.2.  Some lower blood pressures in the setting of poor appetite. -Renal ultrasound and urinalysis -Lower carvedilol  dose -Continue to monitor for low blood pressures -Encourage p.o. intake -Continue to monitor daily Cr, Dose meds for GFR -Monitor Daily I/Os, Daily weight  -Maintain MAP>65 for optimal renal perfusion.  -Avoid nephrotoxic medications including NSAIDs -Use synthetic opioids (Fentanyl /Dilaudid ) if needed -Check Renal U/S to rule out obstruction -Currently no indication for HD  CHF: Volume status appears stable.  No concerns.  Lowering carvedilol  dose  Uncontrolled DM1 with hyperglycemia: Management per primary team  Left BKA: Continue with rehab per primary team  Anemia: Likely multifactorial.  Obtain iron  studies and consider ESA  Recommendations conveyed to primary service.    Levorn Reason [facility] 12/30/2023 1:19 PM   _____________________________________________________________________________________ CC: Left leg amputation  History of Present Illness: Dana [name] is a/an 63 y.o. female with a past medical history of DM1, CAD, GERD, CHF, COPD, history of cardiac arrest who presents with sepsis  Patient was admitted to the hospital at the end of May for sepsis related to an infection of her left foot.  She ultimately underwent BKA.  Because of infection she underwent workup for endocarditis which was negative.  She was treated with daptomycin  until 6/14 because of her MRSA bacteremia.  Creatinine fluctuated during her  hospitalization around a baseline of 2.2-2.7.  She was transferred to rehab 2 days ago.  Since then she is felt well.  Denies fevers, chills, shortness of breath, chest pain, nausea, vomiting, diarrhea.  Feels like she has not quite gotten her strength back up.  Has not felt dizzy or lightheaded.  Creatinine noted to be rising over the last few days but stable today at 3.2.  Has not noted any changes to her urine output.  Blood pressure has been a little bit low.  She does feel like her appetite has been somewhat poor but is improving.  She follows with [facility]. [facility].  She is felt to have diabetic kidney disease CKD 4   Medications:  Current Facility-Administered Medications  Medication Dose Route Frequency Provider Last Rate Last Admin   acetaminophen  (TYLENOL ) tablet 650 mg  650 mg Oral Q6H PRN Angiulli, Daniel J, PA-C       Or   acetaminophen  (TYLENOL ) suppository 650 mg  650 mg Rectal Q6H PRN Angiulli, Daniel J, PA-C       allopurinol  (ZYLOPRIM ) tablet 50 mg  50 mg Oral Daily Angiulli, Daniel J, PA-C   50 mg at 12/30/23 0840   aspirin  EC tablet 81 mg  81 mg Oral Daily Angiulli, Daniel J, PA-C   81 mg at 12/30/23 0840   busPIRone  (BUSPAR ) tablet 15 mg  15 mg Oral TID Angiulli, Daniel J, PA-C   15 mg at 12/30/23 0840   carvedilol  (COREG ) tablet 3.125 mg  3.125 mg Oral BID WC Dashonna Chagnon J, MD       Chlorhexidine  Gluconate Cloth 2 % PADS 6 each  6 each Topical Q12H Lylia Sand, MD   6 each at 12/30/23 [phone]  escitalopram  (LEXAPRO ) tablet 10 mg  10 mg Oral Daily Sterling Eisenmenger, PA-C   10 mg at 12/30/23 0840   ezetimibe  (ZETIA ) tablet 10 mg  10 mg Oral Daily Sterling Eisenmenger, PA-C   10 mg at 12/30/23 0840   gabapentin  (NEURONTIN ) capsule 300 mg  300 mg Oral TID Sterling Eisenmenger, PA-C   300 mg at 12/30/23 0840   insulin  aspart (novoLOG ) injection 0-6 Units  0-6 Units Subcutaneous TID AC & HS Sterling Eisenmenger, PA-C   1 Units at 12/30/23 1252   insulin   aspart (novoLOG ) injection 3 Units  3 Units Subcutaneous TID WC AngiulliEverlyn Hockey, PA-C   3 Units at 12/29/23 1220   insulin  glargine-yfgn (SEMGLEE ) injection 5 Units  5 Units Subcutaneous Daily Sterling Eisenmenger, PA-C   5 Units at 12/30/23 8841   latanoprost  (XALATAN ) 0.005 % ophthalmic solution 1 drop  1 drop Both Eyes QHS AngiulliEverlyn Hockey, PA-C   1 drop at 12/29/23 2019   LORazepam  (ATIVAN ) tablet 0.5 mg  0.5 mg Oral QID PRN Sterling Eisenmenger, PA-C   0.5 mg at 12/27/23 1844   oxyCODONE  (Oxy IR/ROXICODONE ) immediate release tablet 7.5 mg  7.5 mg Oral Q4H PRN Sterling Eisenmenger, PA-C   7.5 mg at 12/30/23 1252   pantoprazole  (PROTONIX ) EC tablet 40 mg  40 mg Oral Daily Sterling Eisenmenger, PA-C   40 mg at 12/30/23 0840   polyethylene glycol (MIRALAX  / GLYCOLAX ) packet 17 g  17 g Oral Daily Sterling Eisenmenger, PA-C   17 g at 12/30/23 0840   senna (SENOKOT) tablet 17.2 mg  2 tablet Oral QHS Sterling Eisenmenger, PA-C   17.2 mg at 12/29/23 2017   ticagrelor  (BRILINTA ) tablet 90 mg  90 mg Oral BID Sterling Eisenmenger, PA-C   90 mg at 12/30/23 0840     ALLERGIES Naproxen, Shellfish-derived products, Strawberry (diagnostic), Celecoxib, Doxepin  hcl, Glucosamine, Iron , Metoclopramide, Metolazone , and Statins  MEDICAL HISTORY Past Medical History:  Diagnosis Date   Acquired absence of left great toe (HCC)    Acquired absence of other left toe(s) (HCC)    ACS (acute coronary syndrome) (HCC)    Acute bronchitis due to COVID-19 virus 03/03/2023   Acute on chronic combined systolic and diastolic CHF (congestive heart failure) (HCC) 09/16/2023   Acute respiratory failure with hypoxia (HCC) 07/20/2023   Anemia    Anoxic brain injury (HCC)    Anxiety    CAD (coronary artery disease)    DES mLAD 04/07/15; DES dRCA & DES pPDA 08/30/16; DES mCX & PTCA OM1 09/29/20   Charcot's joint of foot in type 2 diabetes mellitus (HCC) 12/15/2023   Chronic diastolic (congestive) heart failure (HCC)    Chronic pain     Chronic systolic (congestive) heart failure (HCC)    CKD (chronic kidney disease)    Contrast dye induced nephropathy, possible 09/03/2020   COPD (chronic obstructive pulmonary disease) (HCC)    COVID-19 virus infection 03/03/2023   Demand ischemia (HCC)    Diabetes (HCC)type 1    Diastolic CHF (HCC)    Dyspnea    Encounter for screening mammogram for malignant neoplasm of breast 09/16/2023   Family history of breast cancer 10/23/2021   Gastro-esophageal reflux disease without esophagitis    Hyperlipidemia    Hypertension    Hypertensive heart disease with heart failure (HCC)    Hypoxemia    Idiopathic gout, multiple sites    Leukocytosis 03/29/2022   Localized edema  Low back pain    Mitral regurgitation    Mitral regurgitation    Mixed hyperlipidemia    Mixed incontinence    Morbid (severe) obesity due to excess calories (HCC)    Neuromuscular disorder (HCC)    Neuropathy    Non-ST elevation (NSTEMI) myocardial infarction (HCC)    08/29/20, 09/29/20   OSA (obstructive sleep apnea) 09/03/2020   Other specified hypothyroidism    PAF (paroxysmal atrial fibrillation) (HCC)    s/p pulmonary vein isolation by cryoablation 10/04/15 Dr. Jearldine Mina in [facility]   PEA (Pulseless electrical activity) [facility])    ~ 01/13/21 at [facility] during admission DKA, volume overload, possible CAP, hypoxia s/p ACLS with ROSC ~ 10-15   Pneumonia    Primary insomnia    Rheumatoid arthritis (HCC)    Stroke (HCC)    Thyroid  disease    Type 1 diabetes mellitus (HCC)    URI with cough and congestion 08/22/2023   Vitamin D  deficiency      SOCIAL HISTORY Social History   Socioeconomic History   Marital status: Married    Spouse name: [name]   Number of children: 2   Years of education: 16   Highest education level: Master's degree (e.g., MA, MS, MEng, MEd, MSW, MBA)  Occupational History   Occupation: on disability/retired early former Engineer, site  Tobacco Use   Smoking status: Former     Current packs/day: 0.00    Types: Cigarettes    Start date: 69    Quit date: 1984    Years since quitting: 41.4    Passive exposure: Past   Smokeless tobacco: Never  Vaping Use   Vaping status: Never Used  Substance and Sexual Activity   Alcohol use: Never   Drug use: Never   Sexual activity: Not on file  Other Topics Concern   Not on file  Social History Narrative   Not on file   Social Drivers of Health   Financial Resource Strain: Low Risk  (10/24/2023)   Overall Financial Resource Strain (CARDIA)    Difficulty of Paying Living Expenses: Not hard at all  Food Insecurity: No Food Insecurity (12/14/2023)   Hunger Vital Sign    Worried About Running Out of Food in the Last Year: Never true    Ran Out of Food in the Last Year: Never true  Transportation Needs: No Transportation Needs (12/14/2023)   PRAPARE - Administrator, Civil Service (Medical): No    Lack of Transportation (Non-Medical): No  Physical Activity: Insufficiently Active (10/24/2023)   Exercise Vital Sign    Days of Exercise per Week: 3 days    Minutes of Exercise per Session: 30 min  Stress: Stress Concern Present (10/24/2023)   [employer] of Occupational Health - Occupational Stress Questionnaire    Feeling of Stress : To some extent  Social Connections: Moderately Integrated (12/14/2023)   Social Connection and Isolation Panel    Frequency of Communication with Friends and Family: More than three times a week    Frequency of Social Gatherings with Friends and Family: Three times a week    Attends Religious Services: More than 4 times per year    Active Member of Clubs or Organizations: No    Attends Banker Meetings: Never    Marital Status: Married  Catering manager Violence: Not At Risk (12/14/2023)   Humiliation, Afraid, Rape, and Kick questionnaire    Fear of Current or Ex-Partner: No    Emotionally Abused:  No    Physically Abused: No    Sexually Abused: No      FAMILY HISTORY Family History  Problem Relation Age of Onset   Breast cancer Mother 85       contralateral breast ca dx 17   Coronary artery disease Mother    Hypertension Mother    Hyperlipidemia Mother    Diabetes type II Mother    Lung cancer Mother 38   Diabetes Father    Hypertension Father    Mental illness Sister    Bipolar disorder Other    Depression Other    Schizophrenia Other    Cancer Other        MGF's sisters x2; unknown type      Review of Systems: 12 systems reviewed Otherwise as per HPI, all other systems reviewed and negative  Physical Exam: Vitals:   12/29/23 1934 12/30/23 0530  BP: (!) 122/48 (!) 118/44  Pulse: 60 63  Resp: 18 16  Temp: 98.1 F (36.7 C) 98 F (36.7 C)  SpO2: 94% 96%   Total I/O In: 236 [P.O.:236] Out: -   Intake/Output Summary (Last 24 hours) at 12/30/2023 1319 Last data filed at 12/30/2023 0734 Gross per 24 hour  Intake 712 ml  Output 450 ml  Net 262 ml   General: well-appearing, no acute distress HEENT: anicteric sclera, oropharynx clear without lesions CV: Normal rate, no rub Lungs: clear to auscultation bilaterally, normal work of breathing Abd: soft, non-tender, non-distended Skin: no visible lesions or rashes Psych: alert, engaged, appropriate mood and affect Musculoskeletal: Left leg absent below the knee with some swelling Neuro: normal speech, no gross focal deficits   Test Results Reviewed Lab Results  Component Value Date   NA 136 12/30/2023   K 4.4 12/30/2023   CL 98 12/30/2023   CO2 26 12/30/2023   BUN 58 (H) 12/30/2023   CREATININE 3.24 (H) 12/30/2023   GLU 246 03/29/2022   CALCIUM  8.7 (L) 12/30/2023   ALBUMIN  2.1 (L) 12/30/2023   PHOS 3.9 02/20/2023    CBC Recent Labs  Lab 12/27/23 0628 12/27/23 1829 12/30/23 0503  WBC 10.1 11.9* 10.5  NEUTROABS  --   --  8.3*  HGB 7.3* 8.1* 7.4*  HCT 23.6* 26.3* 24.1*  MCV 87.1 88.3 88.9  PLT 275 301 292    I have reviewed all relevant  outside healthcare records related to the patient's current hospitalization

## 2023-12-30 NOTE — IPOC Note (Signed)
 Overall Plan of Care [facility]) Patient Details Name: Dana [name] MRN: [id number] DOB: 07-17-1960  Admitting Diagnosis: Left below-knee amputee [facility])  Hospital Problems: Principal Problem:   Left below-knee amputee [facility])     Functional Problem List: Nursing Pain, Safety, Bowel, Endurance, Medication Management  PT Balance, Edema, Endurance, Motor, Pain, Safety, Sensory, Skin Integrity  OT Balance, Endurance, Pain, Safety  SLP    TR         Basic ADL's: OT Bathing, Dressing, Toileting     Advanced  ADL's: OT       Transfers: PT Bed Mobility, Bed to Chair, Car  OT Toilet, Tub/Shower     Locomotion: PT Wheelchair Mobility, Ambulation     Additional Impairments: OT None  SLP        TR      Anticipated Outcomes Item Anticipated Outcome  Self Feeding    Swallowing      Basic self-care  Mod I  Toileting  Mod I   Bathroom Transfers Mod I  Bowel/Bladder  manage bowel w mod I assist  Transfers  mod I  Locomotion  mod I with WC mobility  Communication     Cognition     Pain  Pain < 4 with prns  Safety/Judgment  manage w cues   Therapy Plan: PT Intensity: Minimum of 1-2 x/day ,45 to 90 minutes PT Frequency: 5 out of 7 days PT Duration Estimated Length of Stay: 10-12 days OT Intensity: Minimum of 1-2 x/day, 45 to 90 minutes OT Frequency: 5 out of 7 days OT Duration/Estimated Length of Stay: 10-12 days     Team Interventions: Nursing Interventions Patient/Family Education, Pain Management, Medication Management, Discharge Planning, Bowel Management, Disease Management/Prevention  PT interventions Ambulation/gait training, Balance/vestibular training, Cognitive remediation/compensation, Discharge planning, Community reintegration, Disease management/prevention, DME/adaptive equipment instruction, Functional mobility training, Neuromuscular re-education, Pain management, Patient/family education, Psychosocial support, Skin care/wound management,  Splinting/orthotics, Stair training, Therapeutic Activities, Therapeutic Exercise, UE/LE Strength taining/ROM, UE/LE Coordination activities, Wheelchair propulsion/positioning  OT Interventions Warden/ranger, Firefighter, Discharge planning, Disease mangement/prevention, Fish farm manager, Functional mobility training, Neuromuscular re-education, Pain management, Patient/family education, Psychosocial support, Self Care/advanced ADL retraining, Skin care/wound managment, Therapeutic Activities, Therapeutic Exercise, UE/LE Strength taining/ROM, Wheelchair propulsion/positioning  SLP Interventions    TR Interventions    SW/CM Interventions Discharge Planning, Psychosocial Support, Patient/Family Education   Barriers to Discharge MD  Medical stability and Wound care  Nursing Home environment access/layout, Decreased caregiver support, Weight bearing restrictions 2 level, main B+B ramped entry; home with her mom and her spouse.  Spouse works 2-3x/week 12 hr shifts, mom is home 24/7.  Pt is not a caregiver for her mother outside of light meal prep and cleaning.  Mom is independent and can provide supervision and light assist for some ADLs if needed  PT Decreased caregiver support, Wound Care, Lack of/limited family support, Weight bearing restrictions mother will be helping during day, but can only give light set up assistance and food preparation support  OT Weight bearing restrictions, Other (comments) wound vac, foley  SLP      SW Inaccessible home environment, Lack of/limited family support     Team Discharge Planning: Destination: PT-Home ,OT- Home , SLP-  Projected Follow-up: PT-Home health PT, OT-  Home health OT, SLP-  Projected Equipment Needs: PT-To be determined, Wheelchair (measurements), Wheelchair cushion (measurements), OT- To be determined, Sliding board, SLP-  Equipment Details: PT-has RW and rollator, may need WC, OT-Pt owns Manual WC,  hand held shower  head, shower chair, grab bar - shower, RW, Rolator, SPC, toilet riser with bilateral handles, BSC (with standard arms) Patient/family involved in discharge planning: PT- Patient,  OT-Patient, SLP-   MD ELOS: 10-12 Medical Rehab Prognosis:  Excellent Assessment: The patient has been admitted for CIR therapies with the diagnosis of left BKA 12/18/2023 after postoperative left ankle infection. . The team will be addressing functional mobility, strength, stamina, balance, safety, adaptive techniques and equipment, self-care, bowel and bladder mgt, patient and caregiver education. Goals have been set at Mod I. Anticipated discharge destination is home.        See Team Conference Notes for weekly updates to the plan of care

## 2023-12-30 NOTE — Progress Notes (Signed)
 PROGRESS NOTE   Subjective/Complaints: Working with therapy today. No new complaints.   ROS: Patient denies fever, chills, HA, SOB, CP, N/V/D   Objective:   No results found. Recent Labs    12/27/23 1829 12/30/23 0503  WBC 11.9* 10.5  HGB 8.1* 7.4*  HCT 26.3* 24.1*  PLT 301 292   Recent Labs    12/29/23 0518 12/30/23 0503  NA 135 136  K 5.0 4.4  CL 98 98  CO2 26 26  GLUCOSE 130* 122*  BUN 58* 58*  CREATININE 3.22* 3.24*  CALCIUM  8.5* 8.7*    Intake/Output Summary (Last 24 hours) at 12/30/2023 1104 Last data filed at 12/30/2023 0734 Gross per 24 hour  Intake 932 ml  Output 450 ml  Net 482 ml        Physical Exam: Vital Signs Blood pressure (!) 118/44, pulse 63, temperature 98 F (36.7 C), temperature source Oral, resp. rate 16, height 5' 5 (1.651 m), weight 93.4 kg, SpO2 96%.  Constitutional: No distress . Vital signs reviewed. Working with therapy in the gym.  HEENT: NCAT, EOMI, oral membranes moist Neck: supple Cardiovascular: RRR without murmur. No JVD    Respiratory/Chest: CTA Bilaterally without wheezes or rales. Normal effort    GI/Abdomen: BS +, sl tender, non-distended, bruising Ext: no clubbing, cyanosis, or edema Psych: pleasant and cooperative  Skin: Clean and intact without signs of breakdown, abd bruising from heparin  shots Neuro:  Alert and oriented x 3. Normal insight and awareness. Intact Memory. Normal language and speech. Cranial nerve exam unremarkable. MMT: BUE 4+ to 5/5. LLE limited by vac, pain, at least 3 to 3+/5. RLE 4 to 4+/5. Sensory exam normal for light touch and pain in all 4 limbs. No limb ataxia or cerebellar signs. No abnormal tone appreciated.  .  Prior neuro assessment is c/w today's exam 12/30/2023.   Musculoskeletal: Left leg remains swollen, shrinker in place, vac. Tender to palpation    Assessment/Plan: 1. Functional deficits which require 3+ hours per day of  interdisciplinary therapy in a comprehensive inpatient rehab setting. Physiatrist is providing close team supervision and 24 hour management of active medical problems listed below. Physiatrist and rehab team continue to assess barriers to discharge/monitor patient progress toward functional and medical goals  Care Tool:  Bathing    Body parts bathed by patient: Right arm, Left arm, Chest, Abdomen, Right upper leg, Left upper leg, Face   Body parts bathed by helper: Front perineal area, Buttocks, Right lower leg Body parts n/a: Left lower leg   Bathing assist Assist Level: Moderate Assistance - Patient 50 - 74%     Upper Body Dressing/Undressing Upper body dressing   What is the patient wearing?: Pull over shirt    Upper body assist Assist Level: Set up assist    Lower Body Dressing/Undressing Lower body dressing      What is the patient wearing?: Pants, Orthosis, Ace wrap/stump shrinker     Lower body assist Assist for lower body dressing: Maximal Assistance - Patient 25 - 49%     Toileting Toileting    Toileting assist Assist for toileting: Maximal Assistance - Patient 25 - 49%  Transfers Chair/bed transfer  Transfers assist     Chair/bed transfer assist level: Minimal Assistance - Patient > 75%     Locomotion Ambulation   Ambulation assist   Ambulation activity did not occur: Safety/medical concerns (2/2 endurance deficit and WB restrictions)          Walk 10 feet activity   Assist  Walk 10 feet activity did not occur: Safety/medical concerns (2/2 endurance deficit and WB restrictions)        Walk 50 feet activity   Assist Walk 50 feet with 2 turns activity did not occur: Safety/medical concerns (2/2 endurance deficit and WB restrictions)         Walk 150 feet activity   Assist Walk 150 feet activity did not occur: Safety/medical concerns (2/2 endurance deficit and WB restrictions)         Walk 10 feet on uneven surface   activity   Assist Walk 10 feet on uneven surfaces activity did not occur: Safety/medical concerns (2/2 endurance deficit and WB restrictions)         Wheelchair     Assist Is the patient using a wheelchair?: Yes Type of Wheelchair: Manual    Wheelchair assist level: Maximal Assistance - Patient 25 - 49% Max wheelchair distance: 10 ft    Wheelchair 50 feet with 2 turns activity    Assist        Assist Level: Dependent - Patient 0%   Wheelchair 150 feet activity     Assist      Assist Level: Dependent - Patient 0%   Blood pressure (!) 118/44, pulse 63, temperature 98 F (36.7 C), temperature source Oral, resp. rate 16, height 5' 5 (1.651 m), weight 93.4 kg, SpO2 96%.  Medical Problem List and Plan: 1. 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                -patient may shower if leg is covered, vac pump away from water             -ELOS/Goals: 7-10 days, mod I w/c level, supervision ambulatory level  -Continue CIR therapies including PT, OT   -team conference tomorrow 2.  Antithrombotics: -DVT/anticoagulation:  Pharmaceutical: *dc'ed sq heparin  d/t bleeding from sq inj sites  -SCD RLE             -antiplatelet therapy: Aspirin  81 mg daily, Brilinta  90 mg twice daily 3. Pain Management: Neurontin  300 mg 3 times daily, oxycodone  as needed             -pt reports her worst pain is from her low back and related left lumbar radiculopathy. No phantom pain currently                         -continue above meds for now. Encouraged oob                           -therapy can work on ROM, posture, etc.  4. Mood/Behavior/Sleep: Lexapro  10 mg daily, BuSpar  15 mg 3 times daily             -mood is positive             -antipsychotic agents: N/A 5. Neuropsych/cognition: This patient is capable of making decisions on her own behalf. 6.  Wound Care/ID:  daptomycin  through 12/28/23 for MRSA bacteremia              -  MRSA contact precautions             -pt was changed to traditional vac dressing  6/12 as prevena was not holding suction.  6/15-Still no drainage in cannister. Dressing is due for a change Monday.   **Consider removing Vac Monday if no further drainage in cannister.   -Dr. Julio Ohm note indicated plan for 1 week wound vac after DC 7. Fluids/Electrolytes/Nutrition: Routine in and outs with follow-up chemistries 8.  Acute on chronic anemia.   Hgb with drop today to 7.3. minimal sx             -transfused 1u PRBC 6/13             -recheck hgb up to 8.1             -continue Brilinta  for now  -holding heparin  as above  -recheck labs Monday   -fe++ supp  -6/16 HGB 7.4, continue to monitor 9.  Diabetes mellitus with peripheral neuropathy.  Hemoglobin A1c 10.2.  NovoLog  3 units 3 times daily, Semglee  5 units daily.  Check blood sugars AC and at bedtime 10.  CAD/stenting/angioplasty as well as history of mitral valve replacement.  Continue aspirin  and Brilinta .  Follow-up cardiology services 11.  Diastolic congestive heart failure.  Coreg  9.375 mg twice daily, Demadex  60 mg daily (hold).  Monitor for any signs of fluid overload             -need weights-stable   Filed Weights   12/29/23 0900 12/30/23 0542  Weight: 93.5 kg 93.4 kg   t 12.  GERD.  Protonix  13.  Hyperlipidemia.  Zetia  14.  Chronic hypoxic respiratory failure/OSA.  Continue CPAP 15.  CKD stage IV.  Baseline 2.2-2.4.               6/14 -Cr bumped  to 2.7 and sl to 2.81 today              -may be related to anemia              -continue to hold demadex  6/15- Cr continues to trend up-- 3.22  -still think this is due to demadex  she has received up until Friday  - push fluids and recheck BMET in am  -if further increase tomorrow will need to contact nephrology 6/16 Nephrology consulted Cr a little higher today 16.  History of gout.  Allopurinol .  Monitor for any gout flares    Body mass index is 34.27 kg/m.;  LOS: 3 days A  FACE TO FACE EVALUATION WAS PERFORMED  Dana [name] 12/30/2023, 11:04 AM

## 2023-12-30 NOTE — Plan of Care (Signed)
  Problem: Consults Goal: RH LIMB LOSS PATIENT EDUCATION Description: Description: See Patient Education module for eduction specifics. Outcome: Progressing Goal: Nutrition Consult-if indicated Outcome: Progressing Goal: Diabetes Guidelines if Diabetic/Glucose > 140 Description: If diabetic or lab glucose is > 140 mg/dl - Initiate Diabetes/Hyperglycemia Guidelines & Document Interventions  Outcome: Progressing   Problem: RH BOWEL ELIMINATION Goal: RH STG MANAGE BOWEL WITH ASSISTANCE Description: STG Manage Bowel with toileting Assistance. Outcome: Progressing Goal: RH STG MANAGE BOWEL W/MEDICATION W/ASSISTANCE Description: STG Manage Bowel with Medication with  mod I Assistance. Outcome: Progressing   Problem: RH SAFETY Goal: RH STG ADHERE TO SAFETY PRECAUTIONS W/ASSISTANCE/DEVICE Description: STG Adhere to Safety Precautions With cues Assistance/Device. Outcome: Progressing   Problem: RH PAIN MANAGEMENT Goal: RH STG PAIN MANAGED AT OR BELOW PT'S PAIN GOAL Description: < 4 with prns Outcome: Progressing   Problem: RH KNOWLEDGE DEFICIT LIMB LOSS Goal: RH STG INCREASE KNOWLEDGE OF SELF CARE AFTER LIMB LOSS Description: Patient and spouse will be able to manage care at discharge using educational resources for medication, dietary modification and skin care independently Outcome: Progressing

## 2023-12-31 DIAGNOSIS — I5032 Chronic diastolic (congestive) heart failure: Secondary | ICD-10-CM | POA: Diagnosis not present

## 2023-12-31 DIAGNOSIS — N184 Chronic kidney disease, stage 4 (severe): Secondary | ICD-10-CM | POA: Diagnosis not present

## 2023-12-31 DIAGNOSIS — R339 Retention of urine, unspecified: Secondary | ICD-10-CM

## 2023-12-31 DIAGNOSIS — R21 Rash and other nonspecific skin eruption: Secondary | ICD-10-CM | POA: Diagnosis not present

## 2023-12-31 DIAGNOSIS — Z89512 Acquired absence of left leg below knee: Secondary | ICD-10-CM | POA: Diagnosis not present

## 2023-12-31 LAB — RENAL FUNCTION PANEL
Albumin: 2.2 g/dL — ABNORMAL LOW (ref 3.5–5.0)
Anion gap: 9 (ref 5–15)
BUN: 60 mg/dL — ABNORMAL HIGH (ref 6–20)
CO2: 25 mmol/L (ref 22–32)
Calcium: 8.3 mg/dL — ABNORMAL LOW (ref 8.9–10.3)
Chloride: 99 mmol/L (ref 98–111)
Creatinine, Ser: 3.62 mg/dL — ABNORMAL HIGH (ref 0.44–1.00)
GFR, Estimated: 14 mL/min — ABNORMAL LOW (ref >60)
Glucose, Bld: 119 mg/dL — ABNORMAL HIGH (ref 70–99)
Phosphorus: 4.8 mg/dL — ABNORMAL HIGH (ref 2.5–4.6)
Potassium: 4.6 mmol/L (ref 3.5–5.1)
Sodium: 133 mmol/L — ABNORMAL LOW (ref 135–145)

## 2023-12-31 LAB — GLUCOSE, CAPILLARY
Glucose-Capillary: 114 mg/dL — ABNORMAL HIGH (ref 70–99)
Glucose-Capillary: 95 mg/dL (ref 70–99)
Glucose-Capillary: 146 mg/dL — ABNORMAL HIGH (ref 70–99)
Glucose-Capillary: 100 mg/dL — ABNORMAL HIGH (ref 70–99)

## 2023-12-31 MED ORDER — HYDROCORTISONE 0.5 % EX CREA
TOPICAL_CREAM | Freq: Three times a day (TID) | CUTANEOUS | Status: DC
  Filled 2023-12-31: qty 28.35

## 2023-12-31 MED ORDER — MIDODRINE HCL 5 MG PO TABS
5.0000 mg | ORAL_TABLET | Freq: Three times a day (TID) | ORAL | Status: DC
  Administered 2023-12-31 – 2024-01-01 (×5): 5 mg via ORAL
  Filled 2023-12-31 (×5): qty 1

## 2023-12-31 MED ORDER — HYDROCORTISONE 1 % EX CREA
TOPICAL_CREAM | Freq: Three times a day (TID) | CUTANEOUS | Status: DC
  Administered 2024-01-01 – 2024-01-03 (×3): 1 via TOPICAL
  Filled 2023-12-31 (×2): qty 28

## 2023-12-31 NOTE — Progress Notes (Signed)
 PROGRESS NOTE   Subjective/Complaints: Patient noted rash on her right lower extremity.  Nephrology following for AKI on CKD-appreciate assistance.  ROS: Patient denies fever, chills, HA, SOB, CP, N/V/D   Objective:   US  RENAL Result Date: 12/30/2023 CLINICAL DATA:  Impaired renal function. EXAM: RENAL / URINARY TRACT ULTRASOUND COMPLETE COMPARISON:  07/15/2023. FINDINGS: Right Kidney: Length: 10.7 cm. Increased echogenicity. No mass or hydronephrosis visualized. Left Kidney: Length: 10.1 cm. Echogenicity within normal limits. No mass or hydronephrosis visualized. Bladder: Empty with a Foley. IMPRESSION: Increased echogenicity of the right kidney consistent with medical renal disease. No other abnormalities. Electronically Signed   By: Sydell Eva M.D.   On: 12/30/2023 20:19   Recent Labs    12/30/23 0503  WBC 10.5  HGB 7.4*  HCT 24.1*  PLT 292   Recent Labs    12/30/23 0503 12/31/23 0433  NA 136 133*  K 4.4 4.6  CL 98 99  CO2 26 25  GLUCOSE 122* 119*  BUN 58* 60*  CREATININE 3.24* 3.62*  CALCIUM  8.7* 8.3*    Intake/Output Summary (Last 24 hours) at 12/31/2023 1049 Last data filed at 12/31/2023 0730 Gross per 24 hour  Intake 236 ml  Output --  Net 236 ml        Physical Exam: Vital Signs Blood pressure (!) 111/50, pulse 64, temperature 97.9 F (36.6 C), temperature source Oral, resp. rate 18, height 5' 5 (1.651 m), weight 95.1 kg, SpO2 97%.  Constitutional: No distress . Vital signs reviewed. Working with therapy in the gym.  HEENT: NCAT, EOMI, oral membranes moist Neck: supple Cardiovascular: RRR without murmur. No JVD    Respiratory/Chest: CTA Bilaterally without wheezes or rales. Normal effort    GI/Abdomen: BS +, sl tender, non-distended, bruising Ext: no clubbing, cyanosis, or edema Psych: pleasant and cooperative  Skin: Clean and intact without signs of breakdown, abd bruising from heparin   shots Rash on leg- see images Neuro:  Alert and oriented x 3. Normal insight and awareness. Intact Memory. Normal language and speech. Cranial nerve exam unremarkable. MMT: BUE 4+ to 5/5. LLE limited by vac, pain, at least 3 to 3+/5. RLE 4 to 4+/5. Sensory exam normal for light touch and pain in all 4 limbs. No limb ataxia or cerebellar signs. No abnormal tone appreciated.  .  Prior neuro assessment is c/w today's exam 12/31/2023.   Musculoskeletal: Left leg remains swollen, shrinker in place, vac. Tender to palpation          Assessment/Plan: 1. Functional deficits which require 3+ hours per day of interdisciplinary therapy in a comprehensive inpatient rehab setting. Physiatrist is providing close team supervision and 24 hour management of active medical problems listed below. Physiatrist and rehab team continue to assess barriers to discharge/monitor patient progress toward functional and medical goals  Care Tool:  Bathing    Body parts bathed by patient: Right arm, Left arm, Chest, Abdomen, Right upper leg, Left upper leg, Face   Body parts bathed by helper: Front perineal area, Buttocks, Right lower leg Body parts n/a: Left lower leg   Bathing assist Assist Level: Moderate Assistance - Patient 50 - 74%     Upper  Body Dressing/Undressing Upper body dressing   What is the patient wearing?: Pull over shirt    Upper body assist Assist Level: Set up assist    Lower Body Dressing/Undressing Lower body dressing      What is the patient wearing?: Pants, Orthosis, Ace wrap/stump shrinker     Lower body assist Assist for lower body dressing: Maximal Assistance - Patient 25 - 49%     Toileting Toileting    Toileting assist Assist for toileting: Maximal Assistance - Patient 25 - 49%     Transfers Chair/bed transfer  Transfers assist  Chair/bed transfer activity did not occur: Safety/medical concerns  Chair/bed transfer assist level: Minimal Assistance - Patient >  75%     Locomotion Ambulation   Ambulation assist   Ambulation activity did not occur: Safety/medical concerns (2/2 endurance deficit and WB restrictions)          Walk 10 feet activity   Assist  Walk 10 feet activity did not occur: Safety/medical concerns (2/2 endurance deficit and WB restrictions)        Walk 50 feet activity   Assist Walk 50 feet with 2 turns activity did not occur: Safety/medical concerns (2/2 endurance deficit and WB restrictions)         Walk 150 feet activity   Assist Walk 150 feet activity did not occur: Safety/medical concerns (2/2 endurance deficit and WB restrictions)         Walk 10 feet on uneven surface  activity   Assist Walk 10 feet on uneven surfaces activity did not occur: Safety/medical concerns (2/2 endurance deficit and WB restrictions)         Wheelchair     Assist Is the patient using a wheelchair?: Yes Type of Wheelchair: Manual    Wheelchair assist level: Maximal Assistance - Patient 25 - 49% Max wheelchair distance: 10 ft    Wheelchair 50 feet with 2 turns activity    Assist        Assist Level: Dependent - Patient 0%   Wheelchair 150 feet activity     Assist      Assist Level: Dependent - Patient 0%   Blood pressure (!) 111/50, pulse 64, temperature 97.9 F (36.6 C), temperature source Oral, resp. rate 18, height 5' 5 (1.651 m), weight 95.1 kg, SpO2 97%.  Medical Problem List and Plan: 1. 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                -patient may shower if leg is covered, vac pump away from water             -ELOS/Goals: 7-10 days, mod I w/c level, supervision ambulatory level  -Continue CIR therapies including PT, OT   -Team conference today please see physician documentation under team conference tab, met with team  to discuss problems,progress, and goals. Formulized individual treatment plan based on medical  history, underlying problem and comorbidities.   2.  Antithrombotics: -DVT/anticoagulation:  Pharmaceutical: *dc'ed sq heparin  d/t bleeding from sq inj sites  -SCD RLE             -antiplatelet therapy: Aspirin  81 mg daily, Brilinta  90 mg twice daily 3. Pain Management: Neurontin  300 mg 3 times daily, oxycodone  as needed             -pt reports her worst pain is from her low back and related left lumbar radiculopathy. No phantom pain currently                         -  continue above meds for now. Encouraged oob                           -therapy can work on ROM, posture, etc.  4. Mood/Behavior/Sleep: Lexapro  10 mg daily, BuSpar  15 mg 3 times daily             -mood is positive             -antipsychotic agents: N/A 5. Neuropsych/cognition: This patient is capable of making decisions on her own behalf. 6.  Wound Care/ID:  daptomycin  through 12/28/23 for MRSA bacteremia             -MRSA contact precautions             -pt was changed to traditional vac dressing  6/12 as prevena was not holding suction.  6/15-Still no drainage in cannister. Dressing is due for a change Monday.   **Consider removing Vac Monday if no further drainage in cannister.   -Dr. Julio Ohm note indicated plan for 1 week wound vac after DC 7. Fluids/Electrolytes/Nutrition: Routine in and outs with follow-up chemistries 8.  Acute on chronic anemia.   Hgb with drop today to 7.3. minimal sx             -transfused 1u PRBC 6/13             -recheck hgb up to 8.1             -continue Brilinta  for now  -holding heparin  as above  -recheck labs Monday   -fe++ supp  -6/16 HGB 7.4, continue to monitor 9.  Diabetes mellitus with peripheral neuropathy.  Hemoglobin A1c 10.2.  NovoLog  3 units 3 times daily, Semglee  5 units daily.  Check blood sugars AC and at bedtime 10.  CAD/stenting/angioplasty as well as history of mitral valve replacement.  Continue aspirin  and Brilinta .  Follow-up cardiology services 11.  Diastolic congestive  heart failure.  Coreg  9.375 mg twice daily, Demadex  60 mg daily (hold).  Monitor for any signs of fluid overload             -need weights-stable  - 6/17 Coreg  held by nephrology   Filed Weights   12/29/23 0900 12/30/23 0542 12/31/23 0640  Weight: 93.5 kg 93.4 kg 95.1 kg   t 12.  GERD.  Protonix  13.  Hyperlipidemia.  Zetia  14.  Chronic hypoxic respiratory failure/OSA.  Continue CPAP 15.  CKD stage IV.  Baseline 2.2-2.4.               6/14 -Cr bumped  to 2.7 and sl to 2.81 today              -may be related to anemia              -continue to hold demadex  6/15- Cr continues to trend up-- 3.22  -still think this is due to demadex  she has received up until Friday  - push fluids and recheck BMET in am  -if further increase tomorrow will need to contact nephrology 6/16 Nephrology consulted Cr a little higher today 6/17 nephrologist started midodrine  Coreg  held -Foley removed for UA and culture to be completed, will try voiding trial 16.  History of gout.  Allopurinol .  Monitor for any gout flares 17. R leg rash  - Hydrocortisone cream started.  Advised her not to use the compression stocking device she was using previously.  Neurology considering IgA    Body  mass index is 34.89 kg/m.;  LOS: 4 days A FACE TO FACE EVALUATION WAS PERFORMED  Dana [name] 12/31/2023, 10:49 AM

## 2023-12-31 NOTE — Progress Notes (Signed)
 Occupational Therapy Session Note  Patient Details  Name: Dana [name] MRN: [id number] Date of Birth: 1961/02/28  Today's Date: 12/31/2023 OT Individual Time: 1305-1400 OT Individual Time Calculation (min): 55 min    Short Term Goals: Week 1:  OT Short Term Goal 1 (Week 1): Pt will complete sit to stand at sink  at Max A while completing self care task in order to increase functional performance and standing balance. OT Short Term Goal 2 (Week 1): Pt will complete LB dressing while utilizing either lateral leans or sit to stand with Mod A. OT Short Term Goal 3 (Week 1): Pt will don limb guard with Min A demonstrating improved independence with residual limb management.  Skilled Therapeutic Interventions/Progress Updates:    Pt received with 8/10 pain in her residual limb, she is premedicated and requesting distraction for pain management. She propelled the w/c 100 ft with cueing for technique to not overstrain GH joint and for maximizing efficiency. Lateral scoot transfer with min A and cueing for through lift vs dragging her bottom across wheel to prevent sheering force/risk. From EOM worked extensively on sit <> stand transfers in prep for ADL transfers. With mat slightly elevated she was able to stand x6 with RW with cueing fro hand placement and min A. Mirror used to provide Financial trader. Activity was graded up by adding in very small knee flexion and powerful extension to increase concentric/eccentric control and strengthening. She returned to the w/c and was then brought outside for pain management and mood elevation. Pt returned to their room following. Pt was left sitting up in the wheelchair with all needs met, chair alarm set, and call bell within reach.   Therapy Documentation Precautions:  Precautions Precautions: Fall, Other (comment) Recall of Precautions/Restrictions: Intact Precaution/Restrictions Comments: LLE would vac, L limb protector Restrictions Weight Bearing  Restrictions Per Provider Order: Yes LLE Weight Bearing Per Provider Order: Non weight bearing  Therapy/Group: Individual Therapy  Una Ganser 12/31/2023, 6:28 AM

## 2023-12-31 NOTE — Progress Notes (Addendum)
 Nephrology Follow-Up Consult note   Assessment/Recommendations: Dana [name] is a/an 63 y.o. female with a past medical history significant for DM1, CAD, GERD, CHF, COPD, history of cardiac arrest who present w/ gangrenous foot s/p amputation c/b AKI    Nonoliguric AKI on CKD 4: Baseline creatinine 2.2-2.7. Crt now up to 3.6 possibly from low Bps.  -RUS unremarkable -F/u UA -Hold coreg  -start midodrine  5mg  TID -given rash consider something like IgA, need UA and UPC -Continue to monitor for low blood pressures -Encourage p.o. intake -Continue to monitor daily Cr, Dose meds for GFR -Monitor Daily I/Os, Daily weight  -Maintain MAP>65 for optimal renal perfusion.  -Avoid nephrotoxic medications including NSAIDs -Use synthetic opioids (Fentanyl /Dilaudid ) if needed   CHF: Volume status appears stable.  No concerns. Holding coreg  for now   Uncontrolled DM1 with hyperglycemia: Management per primary team   Left BKA: Continue with rehab per primary team   Anemia: Likely multifactorial.  Iron  somewhat low but has allergy . Consider transfusion if needed  Rash: unclear cause, partially palpable. CTM     Recommendations conveyed to primary service.    Levorn Reason [facility] 12/31/2023 10:48 AM  ___________________________________________________________  CC: Left leg amputation  Interval History/Subjective: Patient states she feels fine but she did develop a nonpruritic rash on her right lower extremity.  Does not remember having something similar before.   Medications:  Current Facility-Administered Medications  Medication Dose Route Frequency Provider Last Rate Last Admin   acetaminophen  (TYLENOL ) tablet 650 mg  650 mg Oral Q6H PRN Angiulli, Daniel J, PA-C       Or   acetaminophen  (TYLENOL ) suppository 650 mg  650 mg Rectal Q6H PRN Angiulli, Daniel J, PA-C       allopurinol  (ZYLOPRIM ) tablet 50 mg  50 mg Oral Daily Angiulli, Daniel J, PA-C   50 mg at  12/31/23 0805   aspirin  EC tablet 81 mg  81 mg Oral Daily Angiulli, Daniel J, PA-C   81 mg at 12/31/23 0805   busPIRone  (BUSPAR ) tablet 15 mg  15 mg Oral TID Angiulli, Daniel J, PA-C   15 mg at 12/31/23 0805   Chlorhexidine  Gluconate Cloth 2 % PADS 6 each  6 each Topical Q12H Lylia Sand, MD   6 each at 12/31/23 0522   escitalopram  (LEXAPRO ) tablet 10 mg  10 mg Oral Daily AngiulliEverlyn Hockey, PA-C   10 mg at 12/31/23 0805   ezetimibe  (ZETIA ) tablet 10 mg  10 mg Oral Daily AngiulliEverlyn Hockey, PA-C   10 mg at 12/31/23 0805   gabapentin  (NEURONTIN ) capsule 300 mg  300 mg Oral TID Angiulli, Daniel J, PA-C   300 mg at 12/31/23 0805   insulin  aspart (novoLOG ) injection 0-6 Units  0-6 Units Subcutaneous TID AC & HS Angiulli, Daniel J, PA-C   1 Units at 12/30/23 1757   insulin  aspart (novoLOG ) injection 3 Units  3 Units Subcutaneous TID WC Angiulli, Daniel J, PA-C   3 Units at 12/31/23 0805   insulin  glargine-yfgn (SEMGLEE ) injection 5 Units  5 Units Subcutaneous Daily Angiulli, Daniel J, PA-C   5 Units at 12/31/23 0805   latanoprost  (XALATAN ) 0.005 % ophthalmic solution 1 drop  1 drop Both Eyes QHS AngiulliEverlyn Hockey, PA-C   1 drop at 12/30/23 2113   LORazepam  (ATIVAN ) tablet 0.5 mg  0.5 mg Oral QID PRN Sterling Eisenmenger, PA-C   0.5 mg at 12/30/23 2131   midodrine  (PROAMATINE ) tablet 5 mg  5 mg  Oral TID WC Levorn Reason, MD       oxyCODONE  (Oxy IR/ROXICODONE ) immediate release tablet 7.5 mg  7.5 mg Oral Q4H PRN Sterling Eisenmenger, PA-C   7.5 mg at 12/31/23 4098   pantoprazole  (PROTONIX ) EC tablet 40 mg  40 mg Oral Daily Sterling Eisenmenger, PA-C   40 mg at 12/31/23 0805   polyethylene glycol (MIRALAX  / GLYCOLAX ) packet 17 g  17 g Oral Daily Sterling Eisenmenger, PA-C   17 g at 12/31/23 1191   senna (SENOKOT) tablet 17.2 mg  2 tablet Oral QHS Sterling Eisenmenger, PA-C   17.2 mg at 12/30/23 2111   ticagrelor  (BRILINTA ) tablet 90 mg  90 mg Oral BID Sterling Eisenmenger, PA-C   90 mg at 12/31/23 4782       Review of Systems: 10 systems reviewed and negative except per interval history/subjective  Physical Exam: Vitals:   12/30/23 2044 12/30/23 2332  BP: (!) 111/50   Pulse: 60 64  Resp: 18 18  Temp: 97.9 F (36.6 C)   SpO2: 98% 97%   Total I/O In: 236 [P.O.:236] Out: -   Intake/Output Summary (Last 24 hours) at 12/31/2023 1048 Last data filed at 12/31/2023 0730 Gross per 24 hour  Intake 236 ml  Output --  Net 236 ml   Constitutional: well-appearing, no acute distress ENMT: ears and nose without scars or lesions, MMM CV: normal rate, 1+ nonpitting edema in the bilateral lower extremities Respiratory: Bilateral chest rise, normal work of breathing Gastrointestinal: soft, non-tender, no palpable masses or hernias Skin: Palpable and nonpalpable purpura predominantly on the right shin but do extend minimally up the leg, otherwise no visible lesions or rashes Psych: alert, judgement/insight appropriate, appropriate mood and affect   Test Results I personally reviewed new and old clinical labs and radiology tests Lab Results  Component Value Date   NA 133 (L) 12/31/2023   K 4.6 12/31/2023   CL 99 12/31/2023   CO2 25 12/31/2023   BUN 60 (H) 12/31/2023   CREATININE 3.62 (H) 12/31/2023   GLU 246 03/29/2022   CALCIUM  8.3 (L) 12/31/2023   ALBUMIN  2.2 (L) 12/31/2023   PHOS 4.8 (H) 12/31/2023    CBC Recent Labs  Lab 12/27/23 0628 12/27/23 1829 12/30/23 0503  WBC 10.1 11.9* 10.5  NEUTROABS  --   --  8.3*  HGB 7.3* 8.1* 7.4*  HCT 23.6* 26.3* 24.1*  MCV 87.1 88.3 88.9  PLT 275 301 292

## 2023-12-31 NOTE — Plan of Care (Signed)
  Problem: RH Car Transfers Goal: LTG Patient will perform car transfers with assist (PT) Description: LTG: Patient will perform car transfers with assistance (PT). Flowsheets (Taken 12/31/2023 1243) LTG: Pt will perform car transfers with assist:: Supervision/Verbal cueing

## 2023-12-31 NOTE — Progress Notes (Signed)
 Physical Therapy Session Note  Patient Details  Name: Dana [name] MRN: [id number] Date of Birth: 02-12-1961  Today's Date: 12/31/2023 PT Individual Time: 1425-1452 PT Individual Time Calculation (min): 27 min   Short Term Goals: Week 1:  PT Short Term Goal 1 (Week 1): Pt will complete bed mobility with distant supervision PT Short Term Goal 2 (Week 1): Pt will complete bed > chair transfer with CGA + LRAD PT Short Term Goal 3 (Week 1): Pt will complete sit to stand with modA + LRAD PT Short Term Goal 4 (Week 1): Pt will complete 58ft WC propulsion with minA  Skilled Therapeutic Interventions/Progress Updates:    Pt presents in room in [facility], agreeable to PT, requesting to return to bed. Pt denies pain. Session focused on therapeutic activities for transfer training, bed mobility training, and facilitating participation with self care tasks. Pt completes slideboard transfer with CGA, pt able to place board with CGA, therapist provides CGA for holding board in place. Pt then completes doffing shirt with supervision seated EOB, completes doffing pants seated EOB demonstrating lateral weightshifting technique with therapist providing min cues and visual demonstrating pt demonstrating good carryover, requires min assist for managing pants over lower leg. Pt requires set up for donning hospital gown. Pt completes sit to supine with supervision and cues for positioning once supine. Pt wishing to complete peri care, completes in sidelying however requires mod assist for thoroughness. Pt remains supine with all needs within reach, call light in place, bed alarm activated and RN present at end of session.  Therapy Documentation Precautions:  Precautions Precautions: Fall, Other (comment) Recall of Precautions/Restrictions: Intact Precaution/Restrictions Comments: LLE would vac, L limb protector Restrictions Weight Bearing Restrictions Per Provider Order: Yes LLE Weight Bearing Per Provider Order: Non  weight bearing   Therapy/Group: Individual Therapy  Annia Kilts PT, DPT 12/31/2023, 5:13 PM

## 2023-12-31 NOTE — Progress Notes (Signed)
   12/30/23 2332  BiPAP/CPAP/SIPAP  BiPAP/CPAP/SIPAP Pt Type Adult  BiPAP/CPAP/SIPAP  (home unit)  Mask Type Nasal mask  Mask Size Small  Respiratory Rate 18 breaths/min  Patient Home Machine Yes  Safety Check Completed by RT for Home Unit Yes, no issues noted  Patient Home Mask Yes  Patient Home Tubing Yes  Auto Titrate  (per home settings)  CPAP/SIPAP surface wiped down Yes  Device Plugged into RED Power Outlet Yes  BiPAP/CPAP /SiPAP Vitals  Pulse Rate 64  Resp 18  SpO2 97 %  Bilateral Breath Sounds Clear;Diminished    Pt placed self on bipap prior to RT arrival. No needs at this time. RT will continue to be available as needed.

## 2023-12-31 NOTE — Progress Notes (Signed)
 Occupational Therapy Session Note  Patient Details  Name: Dana [name] MRN: [id number] Date of Birth: Apr 02, 1961  Today's Date: 01/01/2024 OT Individual Time: 1105-1200 OT Individual Time Calculation (min): 55 min    Short Term Goals: Week 1:  OT Short Term Goal 1 (Week 1): Pt will complete sit to stand at sink  at Max A while completing self care task in order to increase functional performance and standing balance. OT Short Term Goal 2 (Week 1): Pt will complete LB dressing while utilizing either lateral leans or sit to stand with Mod A. OT Short Term Goal 3 (Week 1): Pt will don limb guard with Min A demonstrating improved independence with residual limb management.  Skilled Therapeutic Interventions/Progress Updates:    Patient agreeable to participate in OT session. Reports 0/10 pain level. Pt reports that her foley has been removed.   Patient participated in skilled OT session focusing on ADL re-training, functional transfers, and UB strengthening.  Pt reports that she has completed toilet transfers twice last night with nursing using the SB and BSC.   Pt participated in Nustep UBE focusing on UB strength, endurance, and activity tolerance.  Required modification of RPM d/t decreased endurance. Completed .25 miles today in ~10 minutes requiring 2 short rest breaks. Level 1, 226 total revolutions completed, Target RPM: 20, avg: 17.  Pt completed toilet transfer in room with drop arm bariatric BSC and SB. Required set-up of SB and stability while pt completed lateral scoot transfer with CGA. Min A provided for pant management (pulling pants down over hips). Pt removed limb protector while seated on toilet in order to manage pants. Nurse arrived to relieve OT and finish assisting pt with toilet and transfer.    Therapy Documentation Precautions:  Precautions Precautions: Fall, Other (comment) Recall of Precautions/Restrictions: Intact Precaution/Restrictions Comments: LLE would  vac, L limb protector Restrictions Weight Bearing Restrictions Per Provider Order: Yes LLE Weight Bearing Per Provider Order: Non weight bearing  Therapy/Group: Individual Therapy  [name], OTR/L,CBIS  Supplemental OT - [facility] and [facility] Secure Chat Preferred   01/01/2024, 7:52 AM

## 2023-12-31 NOTE — Plan of Care (Signed)
  Problem: Consults Goal: RH LIMB LOSS PATIENT EDUCATION Description: Description: See Patient Education module for eduction specifics. Outcome: Progressing Goal: Nutrition Consult-if indicated Outcome: Progressing Goal: Diabetes Guidelines if Diabetic/Glucose > 140 Description: If diabetic or lab glucose is > 140 mg/dl - Initiate Diabetes/Hyperglycemia Guidelines & Document Interventions  Outcome: Progressing   Problem: RH BOWEL ELIMINATION Goal: RH STG MANAGE BOWEL WITH ASSISTANCE Description: STG Manage Bowel with toileting Assistance. Outcome: Progressing Goal: RH STG MANAGE BOWEL W/MEDICATION W/ASSISTANCE Description: STG Manage Bowel with Medication with  mod I Assistance. Outcome: Progressing   Problem: RH SAFETY Goal: RH STG ADHERE TO SAFETY PRECAUTIONS W/ASSISTANCE/DEVICE Description: STG Adhere to Safety Precautions With cues Assistance/Device. Outcome: Progressing   Problem: RH PAIN MANAGEMENT Goal: RH STG PAIN MANAGED AT OR BELOW PT'S PAIN GOAL Description: < 4 with prns Outcome: Progressing   Problem: RH KNOWLEDGE DEFICIT LIMB LOSS Goal: RH STG INCREASE KNOWLEDGE OF SELF CARE AFTER LIMB LOSS Description: Patient and spouse will be able to manage care at discharge using educational resources for medication, dietary modification and skin care independently Outcome: Progressing

## 2023-12-31 NOTE — Progress Notes (Signed)
 Physical Therapy Session Note  Patient Details  Name: Dana [name] MRN: [id number] Date of Birth: 12/23/1960  Today's Date: 12/31/2023 PT Individual Time: 0820-0858, 1100-1200 PT Individual Time Calculation (min): 38 min, 60 min   Short Term Goals: Week 1:  PT Short Term Goal 1 (Week 1): Pt will complete bed mobility with distant supervision PT Short Term Goal 2 (Week 1): Pt will complete bed > chair transfer with CGA + LRAD PT Short Term Goal 3 (Week 1): Pt will complete sit to stand with modA + LRAD PT Short Term Goal 4 (Week 1): Pt will complete 67ft WC propulsion with minA  Skilled Therapeutic Interventions/Progress Updates:      Treatment Session 1  Pt supine in bed upon arrival. Pt agreeable to therapy. Pt denies any pain, however reports new rash and itching on R LE. Notified nurse, nurse present at start of session for further assessment.   PT managing wound vac and foley throughout session.   Provided pt with the following educational handouts and initiated education in these areas:   Amputee support group of the triad   Post-op instructions for below the Knee amputation  Positioning of residual limb in bed and in [facility]  Shrinker Care guidelines   General Rehabilitation Timeline   Informed pt of education binder with handouts pertaining to pt diagnosis/comorbidities/impairments. Recommended pt review these handouts and let us  know if she has any questions. Plan to review these handouts further throughout pt LOS.   Pt requesting to get dressed. Pt reports she wore the limb protector while in bed throughout the night however having discomfort in lateral aspect of knee. Upon removal, noted indentation from wound vac tubing. Recommended pt remove limb protector when in bed and to adjust tubing throughout day for skin integrity. Pt doffed limb protector for remainder of session while getting dressed.   Pt performed rolling with min A to donn pants with max A. Pt donned bra while  seated in bed with assisting, pt donned shirt with set up assist. Pt required multiple rest breaks 2/2 fatigue/SOB.   Pt seated EOB at end of session with all needs within reach and bed alarm on.   Treatment Session 2   Pt seated in WC upon arrival. Pt agreeable to therapy. Pt denies any pain.   Pt attempted sit to stand in // bars x3 with +2 A to stabilize WC. Pt unable to obtain fully erect posture with max A.   Pt performed mass practice of sit to stand with RW from elevated mat table (progressively lowering however hips higher than knees) with mod A. Pt initially very FOF however demonstrates improved efficiacy with repetition.   Pt performed slide board transfer WC to mat table with CGA downhill, and min A uphill, verbal and tactile cues for head hip ratio with emphasis on not leaning back.   Pt seated in WC at end of session with all needs within reach and bed alarm on.       Therapy Documentation Precautions:  Precautions Precautions: Fall, Other (comment) Recall of Precautions/Restrictions: Intact Precaution/Restrictions Comments: LLE would vac, L limb protector Restrictions Weight Bearing Restrictions Per Provider Order: Yes LLE Weight Bearing Per Provider Order: Non weight bearing  Therapy/Group: Individual Therapy  [name] [place], [place], DPT  12/31/2023, 8:01 AM

## 2024-01-01 DIAGNOSIS — N184 Chronic kidney disease, stage 4 (severe): Secondary | ICD-10-CM | POA: Diagnosis not present

## 2024-01-01 DIAGNOSIS — R21 Rash and other nonspecific skin eruption: Secondary | ICD-10-CM | POA: Diagnosis not present

## 2024-01-01 DIAGNOSIS — Z89512 Acquired absence of left leg below knee: Secondary | ICD-10-CM | POA: Diagnosis not present

## 2024-01-01 DIAGNOSIS — G253 Myoclonus: Secondary | ICD-10-CM

## 2024-01-01 DIAGNOSIS — F331 Major depressive disorder, recurrent, moderate: Secondary | ICD-10-CM

## 2024-01-01 DIAGNOSIS — R339 Retention of urine, unspecified: Secondary | ICD-10-CM | POA: Diagnosis not present

## 2024-01-01 LAB — RENAL FUNCTION PANEL
Albumin: 2.2 g/dL — ABNORMAL LOW (ref 3.5–5.0)
Anion gap: 10 (ref 5–15)
BUN: 61 mg/dL — ABNORMAL HIGH (ref 6–20)
CO2: 26 mmol/L (ref 22–32)
Calcium: 8.6 mg/dL — ABNORMAL LOW (ref 8.9–10.3)
Chloride: 98 mmol/L (ref 98–111)
Creatinine, Ser: 3.71 mg/dL — ABNORMAL HIGH (ref 0.44–1.00)
GFR, Estimated: 13 mL/min — ABNORMAL LOW (ref >60)
Glucose, Bld: 151 mg/dL — ABNORMAL HIGH (ref 70–99)
Phosphorus: 5 mg/dL — ABNORMAL HIGH (ref 2.5–4.6)
Potassium: 4.5 mmol/L (ref 3.5–5.1)
Sodium: 134 mmol/L — ABNORMAL LOW (ref 135–145)

## 2024-01-01 LAB — URINALYSIS, ROUTINE W REFLEX MICROSCOPIC
Bilirubin Urine: NEGATIVE
Glucose, UA: 50 mg/dL — AB
Ketones, ur: NEGATIVE mg/dL
Nitrite: NEGATIVE
Protein, ur: >=300 mg/dL — AB
RBC / HPF: >50 RBC/hpf (ref 0–5)
Specific Gravity, Urine: 1.013 (ref 1.005–1.030)
WBC, UA: >50 WBC/hpf (ref 0–5)
pH: 5 (ref 5.0–8.0)

## 2024-01-01 LAB — GLUCOSE, CAPILLARY
Glucose-Capillary: 163 mg/dL — ABNORMAL HIGH (ref 70–99)
Glucose-Capillary: 110 mg/dL — ABNORMAL HIGH (ref 70–99)
Glucose-Capillary: 163 mg/dL — ABNORMAL HIGH (ref 70–99)
Glucose-Capillary: 116 mg/dL — ABNORMAL HIGH (ref 70–99)

## 2024-01-01 LAB — PROTEIN / CREATININE RATIO, URINE
Creatinine, Urine: 93 mg/dL
Protein Creatinine Ratio: 6.35 mg/mg{creat} — ABNORMAL HIGH (ref 0.00–0.15)
Total Protein, Urine: 591 mg/dL

## 2024-01-01 NOTE — Progress Notes (Signed)
   01/01/24 2239  BiPAP/CPAP/SIPAP  $ Non-Invasive Home Ventilator  Subsequent  BiPAP/CPAP/SIPAP Pt Type Adult  Mask Type Nasal mask  Mask Size Extra large  EPAP 8 cmH2O  FiO2 (%) 28 %  Flow Rate 2 lpm  Patient Home Machine Yes  Safety Check Completed by RT for Home Unit Yes, no issues noted  Patient Home Mask Yes  Patient Home Tubing Yes  CPAP/SIPAP surface wiped down Yes  Device Plugged into RED Power Outlet Yes  BiPAP/CPAP /SiPAP Vitals  Pulse Rate 62  Resp 17  SpO2 99 %  Bilateral Breath Sounds Clear;Diminished

## 2024-01-01 NOTE — Progress Notes (Signed)
 Physical Therapy Session Note  Patient Details  Name: Dana [name] MRN: [id number] Date of Birth: 02-14-1961  Today's Date: 01/01/2024 PT Individual Time: 0847-1000 and 1300-1400 PT Individual Time Calculation (min): 73 min and 60 min.  Short Term Goals: Week 1:  PT Short Term Goal 1 (Week 1): Pt will complete bed mobility with distant supervision PT Short Term Goal 2 (Week 1): Pt will complete bed > chair transfer with CGA + LRAD PT Short Term Goal 3 (Week 1): Pt will complete sit to stand with modA + LRAD PT Short Term Goal 4 (Week 1): Pt will complete 56ft WC propulsion with minA  Skilled Therapeutic Interventions/Progress Updates:   First session:  Pt presents semi-reclined in bed and agreeable to therapy, although not given schedule for today.  Pt states pain from catheter removal, pt given pain meds.  Pt transfers sup to sit w/ sup before donning shorts.  Pt assisted w/ threading shorts over LES, nursing in to detach wound vac.  Pt shifting side to side to pull shorts up, but then suggested to lie down to complete w/ rolling side to side w/ total A from PT.  Limb protector donned w/ total A. Pt returned to sitting EOB and donned pull-over shirt w/ set-up.  Pt placed SB w/ min A and cueing.  Pt transferred w/ CGA and cues for forward lean, WB through R foot, steadying of w/c.  Pt wheeled to dayroom.  Pt performed SB transfers w/c > mat table w/ CGA.  Pt performed scooting side to side on mat table w/ cues for forward lean.  Pt performed yoga block push-ups 3 x 5 w/ rest breaks between.  Pt performed SB transfer mat > w/c w/ CGA.  Pt returned to room and remained sitting in w/c w/ all needs in reach.  Second session:  Pt presents sitting in w/c and agreeable to therapy.  Pt negotiates w/c x 100' w/ B UES and sup, occasional CGA to avoid L side obstacles.  Pt wheeled to outdoors near atrium.  Pt performed removal and application of leg rests x 3 w/ min A for finding pegs.  Pt negotiated w/c x  80' w/ BUES and occasional CGA for safety.  Pt performed x 70' w/ BUES and RLE w/ supervision and improved negotiation.  Pt returned to room and remained sitting in w/c w/ all needs in reach, wound vac returned to wall plug.     Therapy Documentation Precautions:  Precautions Precautions: Fall, Other (comment) Recall of Precautions/Restrictions: Intact Precaution/Restrictions Comments: LLE would vac, L limb protector Restrictions Weight Bearing Restrictions Per Provider Order: Yes LLE Weight Bearing Per Provider Order: Non weight bearing General:   Vital Signs:   Pain:7/10 Pain Assessment Pain Scale: 0-10 Pain Score: 10-Worst pain ever Pain Location: Leg Pain Intervention(s): Medication (See eMAR)   Therapy/Group: Individual Therapy  Pattrick Bady P Bettylou Frew 01/01/2024, 10:01 AM

## 2024-01-01 NOTE — Progress Notes (Signed)
 Occupational Therapy Session Note  Patient Details  Name: Dana [name] MRN: [id number] Date of Birth: Aug 08, 1960  {CHL IP REHAB OT TIME CALCULATIONS:[id number]}   Short Term Goals: Week 1:  OT Short Term Goal 1 (Week 1): Pt will complete sit to stand at sink  at Max A while completing self care task in order to increase functional performance and standing balance. OT Short Term Goal 2 (Week 1): Pt will complete LB dressing while utilizing either lateral leans or sit to stand with Mod A. OT Short Term Goal 3 (Week 1): Pt will don limb guard with Min A demonstrating improved independence with residual limb management.  Skilled Therapeutic Interventions/Progress Updates:    Patient agreeable to participate in OT session. Reports *** pain level.   Patient participated in skilled OT session focusing on ***. Therapist facilitated/assessed/developed/educated/integrated/elicited *** in order to improve/facilitate/promote    Therapy Documentation Precautions:  Precautions Precautions: Fall, Other (comment) Recall of Precautions/Restrictions: Intact Precaution/Restrictions Comments: LLE would vac, L limb protector Restrictions Weight Bearing Restrictions Per Provider Order: Yes LLE Weight Bearing Per Provider Order: Non weight bearing  Therapy/Group: Individual Therapy  [name], OTR/L,CBIS  Supplemental OT - [facility] and [facility] Secure Chat Preferred   01/01/2024, 4:47 PM

## 2024-01-01 NOTE — Progress Notes (Signed)
 PROGRESS NOTE   Subjective/Complaints: Continues to have Rash R leg, reports a little better today from last night. Minimal itching only when touched. Occasional tremor/jerking of arm when holding phone. Foley was removed, reports urinating ok but having mild discomfort.   ROS: Patient denies fever, chills, HA, SOB, CP, abdominal pain, N/V/D + Rash   Objective:   US  RENAL Result Date: 12/30/2023 CLINICAL DATA:  Impaired renal function. EXAM: RENAL / URINARY TRACT ULTRASOUND COMPLETE COMPARISON:  07/15/2023. FINDINGS: Right Kidney: Length: 10.7 cm. Increased echogenicity. No mass or hydronephrosis visualized. Left Kidney: Length: 10.1 cm. Echogenicity within normal limits. No mass or hydronephrosis visualized. Bladder: Empty with a Foley. IMPRESSION: Increased echogenicity of the right kidney consistent with medical renal disease. No other abnormalities. Electronically Signed   By: Sydell Eva M.D.   On: 12/30/2023 20:19   Recent Labs    12/30/23 0503  WBC 10.5  HGB 7.4*  HCT 24.1*  PLT 292   Recent Labs    12/31/23 0433 01/01/24 0504  NA 133* 134*  K 4.6 4.5  CL 99 98  CO2 25 26  GLUCOSE 119* 151*  BUN 60* 61*  CREATININE 3.62* 3.71*  CALCIUM  8.3* 8.6*    Intake/Output Summary (Last 24 hours) at 01/01/2024 1213 Last data filed at 01/01/2024 1019 Gross per 24 hour  Intake 473 ml  Output 310 ml  Net 163 ml        Physical Exam: Vital Signs Blood pressure (!) 139/47, pulse 60, temperature 97.7 F (36.5 C), temperature source Oral, resp. rate 16, height 5' 5 (1.651 m), weight 93.3 kg, SpO2 98%.  Constitutional: No distress . Vital signs reviewed. Sitting in WC HEENT: NCAT, EOMI, oral membranes moist Neck: supple Cardiovascular: RRR without murmur. No JVD    Respiratory/Chest: CTA Bilaterally without wheezes or rales. Normal effort    GI/Abdomen: BS +, sl tender, non-distended, bruising Ext: no clubbing,  cyanosis, or edema Psych: pleasant and cooperative  Skin: Clean and intact without signs of breakdown, abd bruising from heparin  shots Rash on leg- a little darker then yesterday but does appears over same location and larger Neuro:  Alert and oriented x 3. Normal insight and awareness. Intact Memory. Normal language and speech. Cranial nerve exam unremarkable. MMT: BUE 4+ to 5/5. LLE limited by vac, pain, at least 3 to 3+/5. RLE 4 to 4+/5. Sensory exam normal for light touch and pain in all 4 limbs. No limb ataxia or cerebellar signs. No abnormal tone appreciated.  .  NO myoclonus noted while I was there  Prior neuro assessment is c/w today's exam 01/01/2024.   Musculoskeletal: Left leg remains swollen, shrinker in place, vac. Tender to palpation   Images 6/17        Assessment/Plan: 1. Functional deficits which require 3+ hours per day of interdisciplinary therapy in a comprehensive inpatient rehab setting. Physiatrist is providing close team supervision and 24 hour management of active medical problems listed below. Physiatrist and rehab team continue to assess barriers to discharge/monitor patient progress toward functional and medical goals  Care Tool:  Bathing    Body parts bathed by patient: Right arm, Left arm, Chest, Abdomen, Right upper  leg, Left upper leg, Face   Body parts bathed by helper: Front perineal area, Buttocks, Right lower leg Body parts n/a: Left lower leg   Bathing assist Assist Level: Moderate Assistance - Patient 50 - 74%     Upper Body Dressing/Undressing Upper body dressing   What is the patient wearing?: Pull over shirt    Upper body assist Assist Level: Set up assist    Lower Body Dressing/Undressing Lower body dressing      What is the patient wearing?: Pants, Orthosis, Ace wrap/stump shrinker     Lower body assist Assist for lower body dressing: Maximal Assistance - Patient 25 - 49%     Toileting Toileting    Toileting assist  Assist for toileting: Maximal Assistance - Patient 25 - 49%     Transfers Chair/bed transfer  Transfers assist  Chair/bed transfer activity did not occur: Safety/medical concerns  Chair/bed transfer assist level: Contact Guard/Touching assist     Locomotion Ambulation   Ambulation assist   Ambulation activity did not occur: Safety/medical concerns (2/2 endurance deficit and WB restrictions)          Walk 10 feet activity   Assist  Walk 10 feet activity did not occur: Safety/medical concerns (2/2 endurance deficit and WB restrictions)        Walk 50 feet activity   Assist Walk 50 feet with 2 turns activity did not occur: Safety/medical concerns (2/2 endurance deficit and WB restrictions)         Walk 150 feet activity   Assist Walk 150 feet activity did not occur: Safety/medical concerns (2/2 endurance deficit and WB restrictions)         Walk 10 feet on uneven surface  activity   Assist Walk 10 feet on uneven surfaces activity did not occur: Safety/medical concerns (2/2 endurance deficit and WB restrictions)         Wheelchair     Assist Is the patient using a wheelchair?: Yes Type of Wheelchair: Manual    Wheelchair assist level: Supervision/Verbal cueing Max wheelchair distance: 100    Wheelchair 50 feet with 2 turns activity    Assist        Assist Level: Supervision/Verbal cueing   Wheelchair 150 feet activity     Assist      Assist Level: Moderate Assistance - Patient 50 - 74%   Blood pressure (!) 139/47, pulse 60, temperature 97.7 F (36.5 C), temperature source Oral, resp. rate 16, height 5' 5 (1.651 m), weight 93.3 kg, SpO2 98%.  Medical Problem List and Plan: 1. 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                -patient may shower if leg is covered, vac pump away from water             -ELOS/Goals: 7-10 days, mod I w/c level, supervision ambulatory  level  -Continue CIR therapies including PT, OT   -Team conference today please see physician documentation under team conference tab, met with team  to discuss problems,progress, and goals. Formulized individual treatment plan based on medical history, underlying problem and comorbidities.   2.  Antithrombotics: -DVT/anticoagulation:  Pharmaceutical: *dc'ed sq heparin  d/t bleeding from sq inj sites  -SCD RLE             -antiplatelet therapy: Aspirin  81 mg daily, Brilinta  90 mg twice daily 3. Pain Management: Neurontin  300 mg 3 times daily, oxycodone  as needed             -  pt reports her worst pain is from her low back and related left lumbar radiculopathy. No phantom pain currently                         -continue above meds for now. Encouraged oob                           -therapy can work on ROM, posture, etc.  4. Mood/Behavior/Sleep: Lexapro  10 mg daily, BuSpar  15 mg 3 times daily             -mood is positive             -antipsychotic agents: N/A 5. Neuropsych/cognition: This patient is capable of making decisions on her own behalf. 6.  Wound Care/ID:  daptomycin  through 12/28/23 for MRSA bacteremia             -MRSA contact precautions             -pt was changed to traditional vac dressing  6/12 as prevena was not holding suction.  6/15-Still no drainage in cannister. Dressing is due for a change Monday.   **Consider removing Vac Monday if no further drainage in cannister.   -Dr. Julio Ohm note indicated plan for 1 week wound vac after DC 7. Fluids/Electrolytes/Nutrition: Routine in and outs with follow-up chemistries 8.  Acute on chronic anemia.   Hgb with drop today to 7.3. minimal sx             -transfused 1u PRBC 6/13             -recheck hgb up to 8.1             -continue Brilinta  for now  -holding heparin  as above  -recheck labs Monday   -fe++ supp  -6/16 HGB 7.4, continue to monitor 9.  Diabetes mellitus with peripheral neuropathy.  Hemoglobin A1c 10.2.  NovoLog  3 units 3  times daily, Semglee  5 units daily.  Check blood sugars AC and at bedtime 10.  CAD/stenting/angioplasty as well as history of mitral valve replacement.  Continue aspirin  and Brilinta .  Follow-up cardiology services 11.  Diastolic congestive heart failure.  Coreg  9.375 mg twice daily, Demadex  60 mg daily (hold).  Monitor for any signs of fluid overload             -need weights-stable  - 6/17 Coreg  held by nephrology  -6/18 continue to monitor NO signs of overload   Filed Weights   12/30/23 0542 12/31/23 0640 01/01/24 0537  Weight: 93.4 kg 95.1 kg 93.3 kg   t 12.  GERD.  Protonix  13.  Hyperlipidemia.  Zetia  14.  Chronic hypoxic respiratory failure/OSA.  Continue CPAP 15.  CKD stage IV.  Baseline 2.2-2.4.               6/14 -Cr bumped  to 2.7 and sl to 2.81 today              -may be related to anemia              -continue to hold demadex  6/15- Cr continues to trend up-- 3.22  -still think this is due to demadex  she has received up until Friday  - push fluids and recheck BMET in am  -if further increase tomorrow will need to contact nephrology 6/16 Nephrology consulted Cr a little higher today 6/17 nephrologist started midodrine  Coreg  held -Foley removed for UA and  culture to be completed, will try voiding trial 16.  History of gout.  Allopurinol .  Monitor for any gout flares 17. R leg rash  - Hydrocortisone cream started.  - Advised her not to use the compression stocking device she was using previously.  Neurology considering IgA  18. Urinary retention  -Foley removed, monitor voiding trial - 6/18 will message nursing to ensure regular PVRs/Bladder scans, will also ask about U/A- dont see done yet  19. Occasional tremor/myoclonus  -Renal related? Continue to monitor     Body mass index is 34.23 kg/m.;  LOS: 5 days A FACE TO FACE EVALUATION WAS PERFORMED  Dana [name] 01/01/2024, 12:13 PM

## 2024-01-01 NOTE — Patient Care Conference (Signed)
 Inpatient RehabilitationTeam Conference and Plan of Care Update Date: 01/01/2024   Time: 12:13 PM    Patient Name: Dana [name]      Medical Record Number: [id number]  Date of Birth: 15-Mar-1961 Sex: Female         Room/Bed: 4W10C/4W10C-01 Payor Info: Payor: Vincente Green ADVANTAGE / Plan: Gwendel Lemme PPO / Product Type: *No Product type* /    Admit Date/Time:  12/27/2023  5:18 PM  Primary Diagnosis:  Left below-knee amputee [facility])  Hospital Problems: Principal Problem:   Left below-knee amputee [facility])    Expected Discharge Date: Expected Discharge Date: 01/09/24  Team Members Present: Physician leading conference: Dr. Lylia Sand Social Worker Present: Adrianna Albee, LCSW Nurse Present: Forrestine Ike, RN PT Present: Jenney Modest, PT OT Present: [name], OT PPS Coordinator present : Jestine Moron, SLP     Current Status/Progress Goal Weekly Team Focus  Bowel/Bladder   pt is continent of b/b. last bm 6/15. had foley removed 6/17   remain continent   offer toileting q4h/ prn    Swallow/Nutrition/ Hydration               ADL's   SB transfer to toilet or bed: CGA with board set up, UB: SBA, LB: Mod A   CGA-Mod I   Sit to stand transitions with RW, functional transfers, UB strength    Mobility   bed mobility: sup with heavy dependence on hosital bed features,   sit to stand from elevated mat table with RW and mod A, slide board transfer  downhill CGA, min A uphill   mod I/supervision  D/C 6/26?, pt has WC that she purchased OOP that apparently does not have removable arm rests, pt needs WC, slide board, hospital bed follow up HHPT, need to plan family training with husband for car transfer    Communication                Safety/Cognition/ Behavioral Observations               Pain   pt c/o left left pain 7 or >   keep pain level under 7   assess pain qshift/ prn    Skin   skin is intact, has incision left bka  Rash on right leg  keep skin moisture free; rash healing  assess q shift/ prn  Cream to rash per order    Discharge Planning:  HOme with husband who works and elderly mom whom lives with them. She can only provide supervision level.   Team Discussion: Patient post left BKA with wound vac and MRSA contact precautions. Note tremors; question if related to AKI/CKD. Progress limited by fear of falling.  Patient on target to meet rehab goals: yes, currently needs supervision for upper body care and mod assist for lower body care. Needs mod asisst for sit - stand transfers but able to complete slid- board transfers up hiss with min assist and CGA for down hill approach.   *See Care Plan and progress notes for long and short-term goals.   Revisions to Treatment Plan:  N/a   Teaching Needs: Safety, wound/incision care, residual limb care, transfers, toileting, etc.   Current Barriers to Discharge: Decreased caregiver support, Home enviroment access/layout, and Weight bearing restrictions  Possible Resolutions to Barriers: Family education HH follow up services DME: W/C, hospital bed, Drop arm BSC and RW     Medical Summary Current Status: L BKA, Rash, Tremor, AKI on CKD, CHF, urinary retention  Barriers to Discharge: Hypotension;Medical stability;Renal Insufficiency/Failure;Self-care education  Barriers to Discharge Comments: L BKA, Rash, Tremor, AKI on CKD, CHF, urinary retention Possible Resolutions to [employer] Focus: pain control, monitor bladder funcition-voiding trial, monitor wt, nephrology following, hydrocortisone for rash   Continued Need for Acute Rehabilitation Level of Care: The patient requires daily medical management by a physician with specialized training in physical medicine and rehabilitation for the following reasons: Direction of a multidisciplinary physical rehabilitation program to maximize functional independence : Yes Medical management of patient stability for increased  activity during participation in an intensive rehabilitation regime.: Yes Analysis of laboratory values and/or radiology reports with any subsequent need for medication adjustment and/or medical intervention. : Yes   I attest that I was present, lead the team conference, and concur with the assessment and plan of the team.   Forrestine Ike B 01/01/2024, 3:11 PM

## 2024-01-01 NOTE — Progress Notes (Signed)
 Nephrology Follow-Up Consult note   Assessment/Recommendations: Dana [name] is a/an 63 y.o. female with a past medical history significant for DM1, CAD, GERD, CHF, COPD, history of cardiac arrest who present w/ gangrenous foot s/p amputation c/b AKI    Nonoliguric AKI on CKD 4: Baseline creatinine 2.2-2.7. Crt now up to 3.7 possibly from low Bps.  -RUS unremarkable -F/u UA and UPC -Hold coreg  - Continue midodrine  5mg  TID -given rash consider something like IgA, need UA and UPC -Continue to monitor for low blood pressures -Encourage p.o. intake -Continue to monitor daily Cr, Dose meds for GFR -Monitor Daily I/Os, Daily weight  -Maintain MAP>65 for optimal renal perfusion.  -Avoid nephrotoxic medications including NSAIDs -Use synthetic opioids (Fentanyl /Dilaudid ) if needed   CHF: Volume status appears stable.  No concerns. Holding coreg  for now   Uncontrolled DM1 with hyperglycemia: Management per primary team   Left BKA: Continue with rehab per primary team   Anemia: Likely multifactorial.  Iron  somewhat low but has allergy . Consider transfusion if needed  Rash: unclear cause, partially palpable.  May have been traumatic from SCD     Recommendations conveyed to primary service.    Levorn Reason [facility] 01/01/2024 9:33 AM  ___________________________________________________________  CC: Left leg amputation  Interval History/Subjective: Feels well today with no complaints   Medications:  Current Facility-Administered Medications  Medication Dose Route Frequency Provider Last Rate Last Admin   acetaminophen  (TYLENOL ) tablet 650 mg  650 mg Oral Q6H PRN Angiulli, Daniel J, PA-C       Or   acetaminophen  (TYLENOL ) suppository 650 mg  650 mg Rectal Q6H PRN Angiulli, Daniel J, PA-C       allopurinol  (ZYLOPRIM ) tablet 50 mg  50 mg Oral Daily Angiulli, Daniel J, PA-C   50 mg at 01/01/24 9562   aspirin  EC tablet 81 mg  81 mg Oral Daily Angiulli, Daniel  J, PA-C   81 mg at 01/01/24 1308   busPIRone  (BUSPAR ) tablet 15 mg  15 mg Oral TID Angiulli, Daniel J, PA-C   15 mg at 01/01/24 6578   Chlorhexidine  Gluconate Cloth 2 % PADS 6 each  6 each Topical Q12H Lylia Sand, MD   6 each at 01/01/24 0500   escitalopram  (LEXAPRO ) tablet 10 mg  10 mg Oral Daily Angiulli, Daniel J, PA-C   10 mg at 01/01/24 4696   ezetimibe  (ZETIA ) tablet 10 mg  10 mg Oral Daily AngiulliEverlyn Hockey, PA-C   10 mg at 01/01/24 2952   gabapentin  (NEURONTIN ) capsule 300 mg  300 mg Oral TID Angiulli, Daniel J, PA-C   300 mg at 01/01/24 0739   hydrocortisone cream 1 %   Topical TID Lylia Sand, MD   Given at 01/01/24 0739   insulin  aspart (novoLOG ) injection 0-6 Units  0-6 Units Subcutaneous TID AC & HS Angiulli, Daniel J, PA-C   1 Units at 01/01/24 8413   insulin  aspart (novoLOG ) injection 3 Units  3 Units Subcutaneous TID WC Angiulli, Daniel J, PA-C   3 Units at 01/01/24 2440   insulin  glargine-yfgn (SEMGLEE ) injection 5 Units  5 Units Subcutaneous Daily Angiulli, Daniel J, PA-C   5 Units at 01/01/24 1027   latanoprost  (XALATAN ) 0.005 % ophthalmic solution 1 drop  1 drop Both Eyes QHS AngiulliEverlyn Hockey, PA-C   1 drop at 12/31/23 2336   LORazepam  (ATIVAN ) tablet 0.5 mg  0.5 mg Oral QID PRN Sterling Eisenmenger, PA-C   0.5 mg at 12/30/23 2131  midodrine  (PROAMATINE ) tablet 5 mg  5 mg Oral TID WC Yoseph Haile J, MD   5 mg at 01/01/24 1610   oxyCODONE  (Oxy IR/ROXICODONE ) immediate release tablet 7.5 mg  7.5 mg Oral Q4H PRN Sterling Eisenmenger, PA-C   7.5 mg at 01/01/24 9604   pantoprazole  (PROTONIX ) EC tablet 40 mg  40 mg Oral Daily Angiulli, Daniel J, PA-C   40 mg at 01/01/24 0739   polyethylene glycol (MIRALAX  / GLYCOLAX ) packet 17 g  17 g Oral Daily Angiulli, Daniel J, PA-C   17 g at 01/01/24 5409   senna (SENOKOT) tablet 17.2 mg  2 tablet Oral QHS Sterling Eisenmenger, PA-C   17.2 mg at 12/30/23 2111   ticagrelor  (BRILINTA ) tablet 90 mg  90 mg Oral BID Sterling Eisenmenger,  PA-C   90 mg at 01/01/24 8119      Review of Systems: 10 systems reviewed and negative except per interval history/subjective  Physical Exam: Vitals:   12/31/23 1927 01/01/24 0537  BP: (!) 128/56 (!) 139/47  Pulse: 66 60  Resp: 16 16  Temp: 98.1 F (36.7 C) 97.7 F (36.5 C)  SpO2: 95% 98%   No intake/output data recorded.  Intake/Output Summary (Last 24 hours) at 01/01/2024 0933 Last data filed at 12/31/2023 2337 Gross per 24 hour  Intake 236 ml  Output 310 ml  Net -74 ml   Constitutional: well-appearing, no acute distress ENMT: ears and nose without scars or lesions, MMM CV: normal rate, 1+ nonpitting edema in the bilateral lower extremities Respiratory: Bilateral chest rise, normal work of breathing Gastrointestinal: soft, non-tender, no palpable masses or hernias Skin: Palpable and nonpalpable purpura predominantly on the right shin and ankle, otherwise no visible lesions or rashes Psych: alert, judgement/insight appropriate, appropriate mood and affect   Test Results I personally reviewed new and old clinical labs and radiology tests Lab Results  Component Value Date   NA 134 (L) 01/01/2024   K 4.5 01/01/2024   CL 98 01/01/2024   CO2 26 01/01/2024   BUN 61 (H) 01/01/2024   CREATININE 3.71 (H) 01/01/2024   GLU 246 03/29/2022   CALCIUM  8.6 (L) 01/01/2024   ALBUMIN  2.2 (L) 01/01/2024   PHOS 5.0 (H) 01/01/2024    CBC Recent Labs  Lab 12/27/23 0628 12/27/23 1829 12/30/23 0503  WBC 10.1 11.9* 10.5  NEUTROABS  --   --  8.3*  HGB 7.3* 8.1* 7.4*  HCT 23.6* 26.3* 24.1*  MCV 87.1 88.3 88.9  PLT 275 301 292

## 2024-01-01 NOTE — Consult Note (Signed)
 Neuropsychological Consultation Comprehensive Inpatient Rehab   Patient:   Dana [name]   DOB:   August 01, 1960  MR Number:  [id number]  Location:  [facility] [address] CENTER [address] [place] [address] Dept: [phone] Loc: [phone]           Date of Service:   01/01/2024  Start Time:   2 PM End Time:   3 PM  Provider/Observer:  Chapman Commodore, Psy.D.       Clinical Neuropsychologist       Billing Code/Service: [phone]  Reason for Service:    Dana [name] is a 63 year old female referred for neuropsychological consultation during her ongoing admission to the comprehensive inpatient rehabilitation unit.  Patient has a past medical history including recurrent depressive episodes and previous brain injury and recently had a BKA after her left heel developed a wound and infection and was not salvageable resulting in left BKA.  Patient has other past medical history including hyperlipidemia, RA, CVA, insulin -dependent diabetes, chronic kidney disease stage IV, CAD with multiple stents and angioplasty in January of this year, history of mitral valve repair, long-term anticoagulant use, chronic hypoxic respiratory failure, obstructive sleep apnea on CPAP, remote tobacco history, chronic diastolic congestive heart failure, chronic pain, ankle fracture with dislocation and orthopedic repair in April of this year.  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  Patient had presented on 12/14/2023 from [facility] after presenting to their ED on same day for fevers, dysuria and flank pain.  Patient initially started on IV fluids and broad-spectrum antibiotic with initial suspicion of UTI.  Patient also had noted drainage from left heel wound cultures grew MRSA.  Patient transferred to [facility] and infectious disease was consulted and placed on vancomycin .  Full  workup conducted including workup by Dr. Earna Go around left heel wound.  X-rays did show postoperative changes from April's ankle injury and surgery.  Limb was not felt to be salvageable due to infection and location/type.  Patient underwent left BKA on 12/18/2023.  Hospital course has included acute on chronic anemia, infection, renal dysfunction.  There was a psychiatric consult done during hospitalization for depression and patient has remained on Ativan  as needed as well as Lexapro /BuSpar .  During today's clinical visit the patient reports that her mood has been rather stable and that she feels like she is managing the acute change from her BKA.  The patient acknowledges a long history of depression and anxiety but is continued on her regular medications successfully.  One of the bigger stressors that she is coping with is the repeated attempts by her husband to help and she described it is excessive hovering etc.  We worked on this and other coping issues and how to identify potential changes in mood state that could have a negative impact on her ongoing therapies and recovery.  Patient denies any suicidal ideation and denies any acute worsening of her depression and no issues with her ongoing psychotropic medications.  HPI for the current admission:    HPI: Dana [name] is a 63 year old right-handed female with history significant for hyperlipidemia, rheumatoid arthritis, CVA, insulin -dependent diabetes mellitus, CKD stage IV, CAD with multiple stents and angioplasty 07/18/2023 as well as history of mitral valve repair, maintained on low-dose aspirin  as well as Brilinta , chronic hypoxic respiratory failure, OSA on CPAP and quit smoking 41 years ago, chronic diastolic  congestive heart failure, depression/anxiety, chronic pain, left trimalleolar ankle fracture/dislocation status post repair on 11/13/2023. Per chart review patient lives with parent. 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 She is able to live on the main level of the home with bedroom and bathroom. There is a ramped entrance. Presented 12/14/2023 from [facility] after presenting to the ED 5/29 for fevers, dysuria and flank pain. She was started on IV fluids and broad-spectrum antibiotics for suspected UTI with CT abdomen pelvis showing bladder wall thickening on CT as well as noted to have purulent drainage from left heel wound where cultures grew MRSA. She was transferred to [facility]. Blood culture showed no growth, lactic acid within normal limits, sodium 134, glucose 137, BUN 50, creatinine 2.25, hemoglobin 7.2, WBC 19,600. Infectious disease consulted placed on vancomycin . Chest x-ray showed prominent cardiac silhouette and vascular congestion. CT renal stone study showed no hydronephrosis. Enlarged left ovary with surrounding inflammatory stranding and fluid. Transabdominal ultrasound of the pelvis showed nonvisualization of the ovaries bilaterally. Trace simple appearing free fluid within the right adnexa, nonspecific. Nuclear medicine perfusion lung scan no evidence of acute pulmonary emboli. Venous Doppler studies lower extremities negative for DVT. Dr. Julio Ohm follow-up for left heel wound and x-ray showed postoperative changes disrupted ankle mortise soft tissue swelling. Limb was not felt to be salvageable preoperative cardiac clearance obtained and underwent left BKA 12/18/2023 per Dr. Julio Ohm with wound VAC applied and orders to CONTINUE WOUND VAC WITH PREVENA PLUS PUMP AT DISCHARGE FOR 1 WEEK.[facility] course acute on chronic anemia latest hemoglobin 8.8. Maintained on daptomycin  x 5 days postoperatively. Leukocytosis improved to 12,600. Renal function remained stable with latest creatinine 2.31. Patient was cleared for subcutaneous heparin  for DVT prophylaxis. Her aspirin  and Brilinta  were resumed postoperatively. Patient did receive psychiatry consult  during hospitalization for depression remained on Ativan  as needed as well as Lexapro /BuSpar . Therapy evaluations completed due to patient decreased functional mobility was admitted for a comprehensive rehab program.   Medical History:   Past Medical History:  Diagnosis Date   Acquired absence of left great toe (HCC)    Acquired absence of other left toe(s) (HCC)    ACS (acute coronary syndrome) (HCC)    Acute bronchitis due to COVID-19 virus 03/03/2023   Acute on chronic combined systolic and diastolic CHF (congestive heart failure) (HCC) 09/16/2023   Acute respiratory failure with hypoxia (HCC) 07/20/2023   Anemia    Anoxic brain injury (HCC)    Anxiety    CAD (coronary artery disease)    DES mLAD 04/07/15; DES dRCA & DES pPDA 08/30/16; DES mCX & PTCA OM1 09/29/20   Charcot's joint of foot in type 2 diabetes mellitus (HCC) 12/15/2023   Chronic diastolic (congestive) heart failure (HCC)    Chronic pain    Chronic systolic (congestive) heart failure (HCC)    CKD (chronic kidney disease)    Contrast dye induced nephropathy, possible 09/03/2020   COPD (chronic obstructive pulmonary disease) (HCC)    COVID-19 virus infection 03/03/2023   Demand ischemia (HCC)    Diabetes (HCC)type 1    Diastolic CHF (HCC)    Dyspnea    Encounter for screening mammogram for malignant neoplasm of breast 09/16/2023   Family history of breast cancer 10/23/2021   Gastro-esophageal reflux disease without esophagitis    Hyperlipidemia    Hypertension    Hypertensive heart disease with heart failure (HCC)    Hypoxemia  Idiopathic gout, multiple sites    Leukocytosis 03/29/2022   Localized edema    Low back pain    Mitral regurgitation    Mitral regurgitation    Mixed hyperlipidemia    Mixed incontinence    Morbid (severe) obesity due to excess calories (HCC)    Neuromuscular disorder (HCC)    Neuropathy    Non-ST elevation (NSTEMI) myocardial infarction (HCC)    08/29/20, 09/29/20   OSA (obstructive  sleep apnea) 09/03/2020   Other specified hypothyroidism    PAF (paroxysmal atrial fibrillation) (HCC)    s/p pulmonary vein isolation by cryoablation 10/04/15 Dr. Jearldine Mina in [facility]   PEA (Pulseless electrical activity) [facility])    ~ 01/13/21 at [facility] during admission DKA, volume overload, possible CAP, hypoxia s/p ACLS with ROSC ~ 10-15   Pneumonia    Primary insomnia    Rheumatoid arthritis (HCC)    Stroke (HCC)    Thyroid  disease    Type 1 diabetes mellitus (HCC)    URI with cough and congestion 08/22/2023   Vitamin D  deficiency          Patient Active Problem List   Diagnosis Date Noted   Moderate episode of recurrent major depressive disorder (HCC) 01/01/2024   Left below-knee amputee (HCC) 12/27/2023   S/P BKA (below knee amputation), left (HCC) 12/20/2023   High pulmonary arterial pressure (HCC) 12/17/2023   Severe protein-calorie malnutrition (HCC) 12/15/2023   Severe sepsis secondary to postoperative left ankle infection and MRSA bacteremia 12/14/2023   MRSA bacteremia - end of therapy with Daptomycin  12/28/2023. 12/14/2023   Depression with anxiety 12/14/2023   Acute pyelonephritis 12/14/2023   Trimalleolar fracture of ankle, closed 11/11/2023   Situational insomnia 10/04/2023   Acute on chronic combined systolic and diastolic CHF (congestive heart failure) (HCC) 09/16/2023   SOB (shortness of breath) 08/27/2023   Hospital discharge follow-up 07/29/2023   Coronary artery disease involving native coronary artery of native heart with angina pectoris (HCC) 07/20/2023   Presence of heart assist device (HCC) 07/13/2023   Moderate recurrent major depression (HCC) 07/13/2023   History of amputation of hallux (HCC) 05/24/2023   Bilateral hand pain 05/14/2023   Contrast-induced nephropathy 02/20/2023   Unstable angina (HCC) 09/07/2022   Chronic idiopathic constipation 09/01/2022   Hyperphosphatemia 01/09/2022   Genetic testing 11/07/2021   Family history of breast  cancer 10/23/2021   Generalized anxiety disorder 07/11/2021   Mixed hyperlipidemia 07/11/2021   S/P mitral valve clip implantation 05/18/2021   History of cardiac arrest 02/06/2021   Impaired mobility and ADLs 01/20/2021   Left-sided weakness 01/20/2021   Drug-induced myopathy 01/03/2021   Secondary hyperparathyroidism of renal origin (HCC) 11/18/2020   Vitamin D  insufficiency 11/18/2020   Uncontrolled type 1 diabetes mellitus with hyperglycemia, with long-term current use of insulin  (HCC) 10/13/2020   CKD stage 4 due to type 1 diabetes mellitus (HCC) - baseline 2.2 - 2.4 10/13/2020   NSTEMI (non-ST elevated myocardial infarction) (HCC) 09/03/2020   Contrast dye induced nephropathy, possible 09/03/2020   OSA on CPAP 09/03/2020   Insulin  dependent type 2 diabetes mellitus (HCC) 09/03/2020   Need for COVID-19 vaccine 06/28/2020   Chronic gout without tophus 03/23/2020   Pain in both hands 03/23/2020   Uncomplicated opioid dependence (HCC) 03/23/2020   Hypomagnesemia 12/21/2019   Lumbar back pain 10/05/2019   Hypertensive heart and renal disease with congestive heart failure (HCC) 09/18/2019   Chronic combined systolic (congestive) and diastolic (congestive) heart failure (HCC) 11/07/2017  Class 2 severe obesity due to excess calories with serious comorbidity and body mass index (BMI) of 39.0 to 39.9 in adult (HCC) 11/07/2017   Hyperlipidemia LDL goal <70 12/14/2016   Anemia in CKD (chronic kidney disease) 03/23/2016   Chronic pain syndrome 03/23/2016   GERD without esophagitis 03/23/2016   On home oxygen  therapy 03/23/2016   S/P ablation of atrial fibrillation 10/04/2015   Diabetic polyneuropathy associated with type 2 diabetes mellitus (HCC) 04/02/2014    Behavioral Observation/Mental Status:   GAVRIELLA HEARST  presents as a 63 y.o.-year-old Right handed Caucasian Female who appeared her stated age. her dress was Appropriate and she was Well Groomed and her manners were Appropriate  to the situation.  her participation was indicative of Appropriate and Redirectable behaviors.  There were physical disabilities noted.  she displayed an appropriate level of cooperation and motivation.    Interactions:    Active Appropriate  Attention:   abnormal and attention span appeared shorter than expected for age  Memory:   within normal limits; recent and remote memory intact  Visuo-spatial:   not examined  Speech (Volume):  normal  Speech:   normal; normal  Thought Process:  Coherent and Relevant  Coherent and Linear  Though Content:  WNL; not suicidal and not homicidal  Orientation:   person, place, time/date, and situation  Judgment:   Fair  Planning:   Fair  Affect:    Appropriate  Mood:    Dysphoric  Insight:   Good  Intelligence:   normal  Psychiatric History:  Including depression with anxiety and continuing on her home medications including Lexapro /BuSpar .   Family Med/Psych History:  Family History  Problem Relation Age of Onset   Breast cancer Mother 31       contralateral breast ca dx 42   Coronary artery disease Mother    Hypertension Mother    Hyperlipidemia Mother    Diabetes type II Mother    Lung cancer Mother 72   Diabetes Father    Hypertension Father    Mental illness Sister    Bipolar disorder Other    Depression Other    Schizophrenia Other    Cancer Other        MGF's sisters x2; unknown type    Impression/DX:   Shayne G Turan is a 63 year old female referred for neuropsychological consultation during her ongoing admission to the comprehensive inpatient rehabilitation unit.  Patient has a past medical history including recurrent depressive episodes and previous brain injury and recently had a BKA after her left heel developed a wound and infection and was not salvageable resulting in left BKA.  Patient has other past medical history including hyperlipidemia, RA, CVA, insulin -dependent diabetes, chronic kidney disease stage IV, CAD  with multiple stents and angioplasty in January of this year, history of mitral valve repair, long-term anticoagulant use, chronic hypoxic respiratory failure, obstructive sleep apnea on CPAP, remote tobacco history, chronic diastolic congestive heart failure, chronic pain, ankle fracture with dislocation and orthopedic repair in April of this year.  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  Patient had presented on 12/14/2023 from [facility] after presenting to their ED on same day for fevers, dysuria and flank pain.  Patient initially started on IV fluids and broad-spectrum antibiotic with initial suspicion of UTI.  Patient also had noted drainage from left heel wound cultures grew MRSA.  Patient transferred to [facility] and infectious  disease was consulted and placed on vancomycin .  Full workup conducted including workup by Dr. Earna Go around left heel wound.  X-rays did show postoperative changes from April's ankle injury and surgery.  Limb was not felt to be salvageable due to infection and location/type.  Patient underwent left BKA on 12/18/2023.  Hospital course has included acute on chronic anemia, infection, renal dysfunction.  There was a psychiatric consult done during hospitalization for depression and patient has remained on Ativan  as needed as well as Lexapro /BuSpar .  During today's clinical visit the patient reports that her mood has been rather stable and that she feels like she is managing the acute change from her BKA.  The patient acknowledges a long history of depression and anxiety but is continued on her regular medications successfully.  One of the bigger stressors that she is coping with is the repeated attempts by her husband to help and she described it is excessive hovering etc.  We worked on this and other coping issues and how to identify potential changes in mood state that could have a negative  impact on her ongoing therapies and recovery.  Patient denies any suicidal ideation and denies any acute worsening of her depression and no issues with her ongoing psychotropic medications.          Electronically Signed   _______________________ Chapman Commodore, Psy.D. Clinical Neuropsychologist

## 2024-01-02 ENCOUNTER — Encounter (INDEPENDENT_AMBULATORY_CARE_PROVIDER_SITE_OTHER): Admitting: Podiatry

## 2024-01-02 DIAGNOSIS — D62 Acute posthemorrhagic anemia: Secondary | ICD-10-CM

## 2024-01-02 DIAGNOSIS — Z91199 Patient's noncompliance with other medical treatment and regimen due to unspecified reason: Secondary | ICD-10-CM

## 2024-01-02 DIAGNOSIS — R251 Tremor, unspecified: Secondary | ICD-10-CM

## 2024-01-02 LAB — CBC
HCT: 24.2 % — ABNORMAL LOW (ref 36.0–46.0)
Hemoglobin: 7.6 g/dL — ABNORMAL LOW (ref 12.0–15.0)
MCH: 28.1 pg (ref 26.0–34.0)
MCHC: 31.4 g/dL (ref 30.0–36.0)
MCV: 89.6 fL (ref 80.0–100.0)
Platelets: 316 10*3/uL (ref 150–400)
RBC: 2.7 MIL/uL — ABNORMAL LOW (ref 3.87–5.11)
RDW: 17.2 % — ABNORMAL HIGH (ref 11.5–15.5)
WBC: 11.4 10*3/uL — ABNORMAL HIGH (ref 4.0–10.5)
nRBC: 0 % (ref 0.0–0.2)

## 2024-01-02 LAB — GLUCOSE, CAPILLARY
Glucose-Capillary: 121 mg/dL — ABNORMAL HIGH (ref 70–99)
Glucose-Capillary: 154 mg/dL — ABNORMAL HIGH (ref 70–99)
Glucose-Capillary: 111 mg/dL — ABNORMAL HIGH (ref 70–99)
Glucose-Capillary: 96 mg/dL (ref 70–99)

## 2024-01-02 LAB — RENAL FUNCTION PANEL
Albumin: 2.2 g/dL — ABNORMAL LOW (ref 3.5–5.0)
Anion gap: 12 (ref 5–15)
BUN: 64 mg/dL — ABNORMAL HIGH (ref 6–20)
CO2: 22 mmol/L (ref 22–32)
Calcium: 8.6 mg/dL — ABNORMAL LOW (ref 8.9–10.3)
Chloride: 99 mmol/L (ref 98–111)
Creatinine, Ser: 3.57 mg/dL — ABNORMAL HIGH (ref 0.44–1.00)
GFR, Estimated: 14 mL/min — ABNORMAL LOW (ref >60)
Glucose, Bld: 129 mg/dL — ABNORMAL HIGH (ref 70–99)
Phosphorus: 5.4 mg/dL — ABNORMAL HIGH (ref 2.5–4.6)
Potassium: 4.8 mmol/L (ref 3.5–5.1)
Sodium: 133 mmol/L — ABNORMAL LOW (ref 135–145)

## 2024-01-02 LAB — BASIC METABOLIC PANEL WITH GFR
Anion gap: 13 (ref 5–15)
BUN: 64 mg/dL — ABNORMAL HIGH (ref 6–20)
CO2: 22 mmol/L (ref 22–32)
Calcium: 8.7 mg/dL — ABNORMAL LOW (ref 8.9–10.3)
Chloride: 99 mmol/L (ref 98–111)
Creatinine, Ser: 3.61 mg/dL — ABNORMAL HIGH (ref 0.44–1.00)
GFR, Estimated: 14 mL/min — ABNORMAL LOW (ref >60)
Glucose, Bld: 129 mg/dL — ABNORMAL HIGH (ref 70–99)
Potassium: 4.8 mmol/L (ref 3.5–5.1)
Sodium: 134 mmol/L — ABNORMAL LOW (ref 135–145)

## 2024-01-02 NOTE — Progress Notes (Signed)
 Nephrology Follow-Up Consult note   Assessment/Recommendations: Dana [name] is a/an 63 y.o. female with a past medical history significant for DM1, CAD, GERD, CHF, COPD, history of cardiac arrest who present w/ gangrenous foot s/p amputation c/b AKI    Nonoliguric AKI on CKD 4: Baseline creatinine 2.2-2.7.  Creatinine got up to 3.7.  Possibly was from low blood pressures - Workup overall benign.  Creatinine starting to improve -Blood pressure now higher.  Stop midodrine  and consider restarting carvedilol  in the next day or 2 - Continue midodrine  5mg  TID -given rash consider something like IgA especially since she has a fair amount of hematuria and her UPC is 6.3.  However diabetic kidney disease seems the most likely.  She also had a fair amount of pyuria on her urinalysis and is recently had a Foley placed.  I would not pursue biopsy at this time but consider repeating urinalysis to assess for hematuria in about a week -Continue to monitor for low blood pressures -Encourage p.o. intake -Continue to monitor daily Cr, Dose meds for GFR -Monitor Daily I/Os, Daily weight  -Maintain MAP>65 for optimal renal perfusion.  -Avoid nephrotoxic medications including NSAIDs -Use synthetic opioids (Fentanyl /Dilaudid ) if needed   CHF: Volume status appears stable.  No concerns. Holding coreg  for now   Uncontrolled DM1 with hyperglycemia: Management per primary team   Left BKA: Continue with rehab per primary team   Anemia: Likely multifactorial.  Iron  somewhat low but has allergy . Consider transfusion if needed  Rash: unclear cause, partially palpable.  May have been traumatic from SCD     Recommendations conveyed to primary service.    Levorn Reason [facility] 01/02/2024 10:17 AM  ___________________________________________________________  CC: Left leg amputation  Interval History/Subjective: Overall he was well.  Having some resting tremor which is  new.   Medications:  Current Facility-Administered Medications  Medication Dose Route Frequency Provider Last Rate Last Admin   acetaminophen  (TYLENOL ) tablet 650 mg  650 mg Oral Q6H PRN Angiulli, Daniel J, PA-C       Or   acetaminophen  (TYLENOL ) suppository 650 mg  650 mg Rectal Q6H PRN Angiulli, Daniel J, PA-C       allopurinol  (ZYLOPRIM ) tablet 50 mg  50 mg Oral Daily Angiulli, Daniel J, PA-C   50 mg at 01/02/24 0941   aspirin  EC tablet 81 mg  81 mg Oral Daily Angiulli, Daniel J, PA-C   81 mg at 01/02/24 0940   busPIRone  (BUSPAR ) tablet 15 mg  15 mg Oral TID Angiulli, Daniel J, PA-C   15 mg at 01/02/24 0940   Chlorhexidine  Gluconate Cloth 2 % PADS 6 each  6 each Topical Q12H Lylia Sand, MD   6 each at 01/01/24 0500   escitalopram  (LEXAPRO ) tablet 10 mg  10 mg Oral Daily Angiulli, Daniel J, PA-C   10 mg at 01/02/24 0940   ezetimibe  (ZETIA ) tablet 10 mg  10 mg Oral Daily AngiulliEverlyn Hockey, PA-C   10 mg at 01/02/24 0940   gabapentin  (NEURONTIN ) capsule 300 mg  300 mg Oral TID Angiulli, Daniel J, PA-C   300 mg at 01/02/24 6962   hydrocortisone cream 1 %   Topical TID Lylia Sand, MD   Given at 01/02/24 0945   insulin  aspart (novoLOG ) injection 0-6 Units  0-6 Units Subcutaneous TID AC & HS AngiulliEverlyn Hockey, PA-C   1 Units at 01/01/24 1759   insulin  aspart (novoLOG ) injection 3 Units  3 Units Subcutaneous TID WC Angiulli,  Everlyn Hockey, PA-C   3 Units at 01/01/24 1759   insulin  glargine-yfgn (SEMGLEE ) injection 5 Units  5 Units Subcutaneous Daily Angiulli, Daniel J, PA-C   5 Units at 01/02/24 0865   latanoprost  (XALATAN ) 0.005 % ophthalmic solution 1 drop  1 drop Both Eyes QHS AngiulliEverlyn Hockey, PA-C   1 drop at 01/01/24 2022   LORazepam  (ATIVAN ) tablet 0.5 mg  0.5 mg Oral QID PRN Sterling Eisenmenger, PA-C   0.5 mg at 01/01/24 1035   oxyCODONE  (Oxy IR/ROXICODONE ) immediate release tablet 7.5 mg  7.5 mg Oral Q4H PRN Sterling Eisenmenger, PA-C   7.5 mg at 01/02/24 0340   pantoprazole   (PROTONIX ) EC tablet 40 mg  40 mg Oral Daily Angiulli, Daniel J, PA-C   40 mg at 01/02/24 0940   polyethylene glycol (MIRALAX  / GLYCOLAX ) packet 17 g  17 g Oral Daily Sterling Eisenmenger, PA-C   17 g at 01/01/24 7846   senna (SENOKOT) tablet 17.2 mg  2 tablet Oral QHS Angiulli, Daniel J, PA-C   17.2 mg at 01/01/24 2022   ticagrelor  (BRILINTA ) tablet 90 mg  90 mg Oral BID Sterling Eisenmenger, PA-C   90 mg at 01/02/24 0940      Review of Systems: 10 systems reviewed and negative except per interval history/subjective  Physical Exam: Vitals:   01/01/24 2239 01/02/24 0304  BP:  (!) 141/62  Pulse: 62 65  Resp: 17 18  Temp:  98.7 F (37.1 C)  SpO2: 99% 100%   No intake/output data recorded.  Intake/Output Summary (Last 24 hours) at 01/02/2024 1017 Last data filed at 01/01/2024 1345 Gross per 24 hour  Intake 473 ml  Output --  Net 473 ml   Constitutional: well-appearing, no acute distress ENMT: ears and nose without scars or lesions, MMM CV: normal rate, 1+ nonpitting edema in the bilateral lower extremities Respiratory: Bilateral chest rise, normal work of breathing Gastrointestinal: soft, non-tender, no palpable masses or hernias Skin: Palpable and nonpalpable purpura predominantly on the right shin and ankle, otherwise no visible lesions or rashes Psych: alert, judgement/insight appropriate, appropriate mood and affect Neuro: Resting tremor in the bilateral hands.  No myoclonic jerking.  No asterixis   Test Results I personally reviewed new and old clinical labs and radiology tests Lab Results  Component Value Date   NA 133 (L) 01/02/2024   K 4.8 01/02/2024   CL 99 01/02/2024   CO2 22 01/02/2024   BUN 64 (H) 01/02/2024   CREATININE 3.57 (H) 01/02/2024   GLU 246 03/29/2022   CALCIUM  8.6 (L) 01/02/2024   ALBUMIN  2.2 (L) 01/02/2024   PHOS 5.4 (H) 01/02/2024    CBC Recent Labs  Lab 12/27/23 1829 12/30/23 0503 01/02/24 0510  WBC 11.9* 10.5 11.4*  NEUTROABS  --  8.3*   --   HGB 8.1* 7.4* 7.6*  HCT 26.3* 24.1* 24.2*  MCV 88.3 88.9 89.6  PLT 301 292 316

## 2024-01-02 NOTE — Progress Notes (Signed)
 PROGRESS NOTE   Subjective/Complaints: Wound VAC to be removed today.  Right leg rash appears a little improved from prior.  She continues have some tremor in her bilateral upper extremities.  ROS: Patient denies fever, chills, HA, SOB, CP, abdominal pain, N/V/D + Rash  + Tremor  Objective:   No results found.  Recent Labs    01/02/24 0510  WBC 11.4*  HGB 7.6*  HCT 24.2*  PLT 316   Recent Labs    01/02/24 0510 01/02/24 0511  NA 134* 133*  K 4.8 4.8  CL 99 99  CO2 22 22  GLUCOSE 129* 129*  BUN 64* 64*  CREATININE 3.61* 3.57*  CALCIUM  8.7* 8.6*   No intake or output data in the 24 hours ending 01/02/24 1700       Physical Exam: Vital Signs Blood pressure (!) 141/62, pulse 65, temperature 98.7 F (37.1 C), temperature source Oral, resp. rate 18, height 5' 5 (1.651 m), weight 95.5 kg, SpO2 100%.  Constitutional: No distress . Vital signs reviewed. Sitting in [facility] working with therapy HEENT: NCAT, EOMI, oral membranes moist Neck: supple Cardiovascular: RRR without murmur. No JVD    Respiratory/Chest: CTA Bilaterally without wheezes or rales. Normal effort    GI/Abdomen: BS +, sl tender, non-distended, bruising Ext: no clubbing, cyanosis, or edema Psych: pleasant and cooperative  Skin: Clean and intact without signs of breakdown, abd bruising from heparin  shots Rash on leg- a little improved today Neuro:  Alert and oriented x 3. Normal insight and awareness. Intact Memory. Normal language and speech. Cranial nerve exam unremarkable. MMT: BUE 4+ to 5/5. LLE limited by vac, pain, at least 3 to 3+/5. RLE 4 to 4+/5. Sensory exam normal for light touch and pain in all 4 limbs. No limb ataxia or cerebellar signs. No abnormal tone appreciated.  .  NO myoclonus noted while I was there, mild b/l UE tremor noted   Prior neuro assessment is c/w today's exam 01/02/2024.   Musculoskeletal: Left leg remains swollen,  shrinker in place, vac. Tender to palpation   Images 6/17        Assessment/Plan: 1. Functional deficits which require 3+ hours per day of interdisciplinary therapy in a comprehensive inpatient rehab setting. Physiatrist is providing close team supervision and 24 hour management of active medical problems listed below. Physiatrist and rehab team continue to assess barriers to discharge/monitor patient progress toward functional and medical goals  Care Tool:  Bathing    Body parts bathed by patient: Right arm, Left arm, Chest, Abdomen, Right upper leg, Left upper leg, Face   Body parts bathed by helper: Front perineal area, Buttocks, Right lower leg Body parts n/a: Left lower leg   Bathing assist Assist Level: Moderate Assistance - Patient 50 - 74%     Upper Body Dressing/Undressing Upper body dressing   What is the patient wearing?: Pull over shirt    Upper body assist Assist Level: Set up assist    Lower Body Dressing/Undressing Lower body dressing      What is the patient wearing?: Pants, Orthosis, Ace wrap/stump shrinker     Lower body assist Assist for lower body dressing: Maximal Assistance -  Patient 25 - 49%     Toileting Toileting    Toileting assist Assist for toileting: Maximal Assistance - Patient 25 - 49%     Transfers Chair/bed transfer  Transfers assist  Chair/bed transfer activity did not occur: Safety/medical concerns  Chair/bed transfer assist level: Contact Guard/Touching assist     Locomotion Ambulation   Ambulation assist   Ambulation activity did not occur: Safety/medical concerns (2/2 endurance deficit and WB restrictions)          Walk 10 feet activity   Assist  Walk 10 feet activity did not occur: Safety/medical concerns (2/2 endurance deficit and WB restrictions)        Walk 50 feet activity   Assist Walk 50 feet with 2 turns activity did not occur: Safety/medical concerns (2/2 endurance deficit and WB  restrictions)         Walk 150 feet activity   Assist Walk 150 feet activity did not occur: Safety/medical concerns (2/2 endurance deficit and WB restrictions)         Walk 10 feet on uneven surface  activity   Assist Walk 10 feet on uneven surfaces activity did not occur: Safety/medical concerns (2/2 endurance deficit and WB restrictions)         Wheelchair     Assist Is the patient using a wheelchair?: Yes Type of Wheelchair: Manual    Wheelchair assist level: Supervision/Verbal cueing Max wheelchair distance: 100    Wheelchair 50 feet with 2 turns activity    Assist        Assist Level: Supervision/Verbal cueing   Wheelchair 150 feet activity     Assist      Assist Level: Moderate Assistance - Patient 50 - 74%   Blood pressure (!) 141/62, pulse 65, temperature 98.7 F (37.1 C), temperature source Oral, resp. rate 18, height 5' 5 (1.651 m), weight 95.5 kg, SpO2 100%.  Medical Problem List and Plan: 1. 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                -patient may shower if leg is covered, vac pump away from water             -ELOS/Goals: 7-10 days, mod I w/c level, supervision ambulatory level  -Continue CIR therapies including PT, OT   - Expected discharge 6/25  2.  Antithrombotics: -DVT/anticoagulation:  Pharmaceutical: *dc'ed sq heparin  d/t bleeding from sq inj sites  -SCD RLE             -antiplatelet therapy: Aspirin  81 mg daily, Brilinta  90 mg twice daily 3. Pain Management: Neurontin  300 mg 3 times daily, oxycodone  as needed             -pt reports her worst pain is from her low back and related left lumbar radiculopathy. No phantom pain currently                         -continue above meds for now. Encouraged oob                           -therapy can work on ROM, posture, etc.  4. Mood/Behavior/Sleep: Lexapro  10 mg daily, BuSpar  15 mg 3 times daily             -mood is  positive             -antipsychotic agents: N/A 5.  Neuropsych/cognition: This patient is capable of making decisions on her own behalf. 6.  Wound Care/ID:  daptomycin  through 12/28/23 for MRSA bacteremia             -MRSA contact precautions             -pt was changed to traditional vac dressing  6/12 as prevena was not holding suction.  6/15-Still no drainage in cannister. Dressing is due for a change Monday.   **Consider removing Vac Monday if no further drainage in cannister.   -Dr. Julio Ohm note indicated plan for 1 week wound vac after DC  -6/19 DC wound VAC today 7. Fluids/Electrolytes/Nutrition: Routine in and outs with follow-up chemistries 8.  Acute on chronic anemia.   Hgb with drop today to 7.3. minimal sx             -transfused 1u PRBC 6/13             -recheck hgb up to 8.1             -continue Brilinta  for now  -holding heparin  as above  -fe++ supp  -6/16 HGB 7.4, continue to monitor  -6/19  HGB stable 7.6, continue to monitor  9.  Diabetes mellitus with peripheral neuropathy.  Hemoglobin A1c 10.2.  NovoLog  3 units 3 times daily, Semglee  5 units daily.  Check blood sugars AC and at bedtime 10.  CAD/stenting/angioplasty as well as history of mitral valve replacement.  Continue aspirin  and Brilinta .  Follow-up cardiology services 11.  Diastolic congestive heart failure.  Coreg  9.375 mg twice daily, Demadex  60 mg daily (hold).  Monitor for any signs of fluid overload             -need weights-stable  - 6/17 Coreg  held by nephrology  -6/18-19 continue to monitor NO signs of overload   Filed Weights   12/31/23 0640 01/01/24 0537 01/02/24 0634  Weight: 95.1 kg 93.3 kg 95.5 kg   t 12.  GERD.  Protonix  13.  Hyperlipidemia.  Zetia  14.  Chronic hypoxic respiratory failure/OSA.  Continue CPAP 15.  CKD stage IV.  Baseline 2.2-2.4.               6/14 -Cr bumped  to 2.7 and sl to 2.81 today              -may be related to anemia              -continue to hold demadex  6/15- Cr  continues to trend up-- 3.22  -still think this is due to demadex  she has received up until Friday  - push fluids and recheck BMET in am  -if further increase tomorrow will need to contact nephrology 6/16 Nephrology consulted Cr a little higher today 6/17 nephrologist started midodrine  Coreg  held -Foley removed for UA and culture to be completed 6/19 discussed with nephrology regarding pyuria, since Foley just removed and no symptoms continue to monitor for now and will plan to repeat UA in several days. 16.  History of gout.  Allopurinol .  Monitor for any gout flares 17. R leg rash  - Hydrocortisone cream started.  - Advised her not to use the compression stocking device she was using previously.  Neurology considering IgA  18. Urinary retention  -Foley removed, monitor voiding trial - 6/18 will message nursing to ensure regular PVRs/Bladder scans, will also ask about U/A- dont see done yet  6/19 patient did not have elevated bladder scans yesterday, has been continent  19.  Occasional tremor/myoclonus  -Renal related? Continue to monitor .   6/19 discussed with nephrology not felt to be uremic related.  Possibly due to discontinuation of beta-blocker    Body mass index is 35.04 kg/m.;  LOS: 6 days A FACE TO FACE EVALUATION WAS PERFORMED  Dana [name] 01/02/2024, 5:00 PM

## 2024-01-02 NOTE — Progress Notes (Signed)
 Occupational Therapy Session Note  Patient Details  Name: Dana [name] MRN: [id number] Date of Birth: 11/27/1960  Today's Date: 01/02/2024 OT Individual Time: 1102-1201 OT Individual Time Calculation (min): 59 min    Short Term Goals: Week 1:  OT Short Term Goal 1 (Week 1): Pt will complete sit to stand at sink  at Max A while completing self care task in order to increase functional performance and standing balance. OT Short Term Goal 2 (Week 1): Pt will complete LB dressing while utilizing either lateral leans or sit to stand with Mod A. OT Short Term Goal 3 (Week 1): Pt will don limb guard with Min A demonstrating improved independence with residual limb management.  Skilled Therapeutic Interventions/Progress Updates:  Patient participated in skilled OT session today. Patient requested to go outside for therapy today working on wc mobility from elevator to gift shop approx 50 feet with good ability to self correct for direction due to decreased LUE ROM and LUE strength. Patient participated in dynamic balance activity sitting in wc with UE strength utilizing #1 dowel rod and ball. Patient completed UE strengthening with yellow theraband for all UE major muscle groups. Patient returned to gym to complete wc push ups x3 for proper positioning and placement for standing. Patient able to complete 3 stands in parallel bars for 20-45 seconds with mod to max A to increase functional activity tolerance and ability to complete ADLs. Patient educated utilizing HEP on UE strengthening exercises to complete in home.     Therapy Documentation Precautions:  Precautions Precautions: Fall, Other (comment) Recall of Precautions/Restrictions: Intact Precaution/Restrictions Comments: LLE would vac, [name] limb protector Restrictions Weight Bearing Restrictions Per Provider Order: Yes LLE Weight Bearing Per Provider Order: Non weight bearing  Pain: 4/10   Therapy/Group: Individual Therapy  Dana [name]  Dana [name] OTR/[name]  01/02/2024, 12:13 PM

## 2024-01-02 NOTE — Progress Notes (Signed)
Patient did not show for scheduled appointment today.

## 2024-01-02 NOTE — Plan of Care (Signed)
  Problem: Consults Goal: RH LIMB LOSS PATIENT EDUCATION Description: Description: See Patient Education module for eduction specifics. Outcome: Progressing Goal: Nutrition Consult-if indicated Outcome: Progressing Goal: Diabetes Guidelines if Diabetic/Glucose > 140 Description: If diabetic or lab glucose is > 140 mg/dl - Initiate Diabetes/Hyperglycemia Guidelines & Document Interventions  Outcome: Progressing   Problem: RH BOWEL ELIMINATION Goal: RH STG MANAGE BOWEL WITH ASSISTANCE Description: STG Manage Bowel with toileting Assistance. Outcome: Progressing Goal: RH STG MANAGE BOWEL W/MEDICATION W/ASSISTANCE Description: STG Manage Bowel with Medication with  mod I Assistance. Outcome: Progressing   Problem: RH SAFETY Goal: RH STG ADHERE TO SAFETY PRECAUTIONS W/ASSISTANCE/DEVICE Description: STG Adhere to Safety Precautions With cues Assistance/Device. Outcome: Progressing   Problem: RH PAIN MANAGEMENT Goal: RH STG PAIN MANAGED AT OR BELOW PT'S PAIN GOAL Description: < 4 with prns Outcome: Progressing   Problem: RH KNOWLEDGE DEFICIT LIMB LOSS Goal: RH STG INCREASE KNOWLEDGE OF SELF CARE AFTER LIMB LOSS Description: Patient and spouse will be able to manage care at discharge using educational resources for medication, dietary modification and skin care independently Outcome: Progressing

## 2024-01-02 NOTE — Progress Notes (Incomplete)
 Physical Therapy Session Note  Patient Details  Name: Dana [name] MRN: [id number] Date of Birth: 06/16/1961  Today's Date: 01/02/2024 PT Individual Time: 0800-0902 PT Individual Time Calculation (min): 62 min   Short Term Goals: Week 1:  PT Short Term Goal 1 (Week 1): Pt will complete bed mobility with distant supervision PT Short Term Goal 2 (Week 1): Pt will complete bed > chair transfer with CGA + LRAD PT Short Term Goal 3 (Week 1): Pt will complete sit to stand with modA + LRAD PT Short Term Goal 4 (Week 1): Pt will complete 64ft WC propulsion with minA  Skilled Therapeutic Interventions/Progress Updates:      Treatment Session 1  Pt supine in bed upon arrival. Pt awoken with increased time and agreeable to therapy. Pt reports 4/10 pain in R LE, premedicated. Pt provided rest breaks and repositioning as needed.   Pt donned shirt while in bed with set up assist. Pt fed pants through legs with set up assist with therapist managing wound vac. Pt attemtped to donn pants over buttocks over having diffiulcty 2/2 UE strength deficits and fatigue. Pt performed sit to stand from elevated hospital bed with RW and mod A. Pt donned pants over buttocks with total A.   Pt performed slide board transfer bed to [facility] (level) with therapist articulating articulating the steps with mod-max questioning cues. Pt placed slide board with min A, verbal cues provided for proper positioning. Pt performed slide board transfer bed to [facility] with CGA. Pt removed slide board, and donned arm rest with increased time and supervision. Pt required assist with donning leg rests, pt reports difficulty seeing the leg rests 2/2 increased sleepiness.   Pt seated at sink brushed hair and teeth with set up assist.   Pt self propelled WC ~50 feet prior to needing break 2/2 fatigue in B UE. Therapist donned TB to B wheels for improved grip and ease with WC propulsion. Therapist self propelled WC main gym to room with B UE and R LE  with improved ease.   Pt seated in WC at end of session eating lunch.   Treatment Session 2   Pt seated in WC upon arrival. Pt agreeable to therapy. Pt denies any pain. Pt daughter present during session.   Pt expressed concerns regarding D/C date 6/26 2/2 feeling as though she will not be ready. Discussed pt expectations/goals and therapy goals/POC. Extensive education provided regarding pt impairments and justifications for goals at this time, and plan for follow up therapy. With support and encouragement from PT and pt daughter, pt verbalized understanding and agreeable and pt overall very appreciative of this information.   Wound vac***    Therapy Documentation Precautions:  Precautions Precautions: Fall, Other (comment) Recall of Precautions/Restrictions: Intact Precaution/Restrictions Comments: LLE would vac, L limb protector Restrictions Weight Bearing Restrictions Per Provider Order: Yes LLE Weight Bearing Per Provider Order: Non weight bearing  Therapy/Group: Individual Therapy  [facility] Jenney Modest, [place], DPT   01/02/2024, 7:44 AM

## 2024-01-02 NOTE — Progress Notes (Signed)
 Patient ID: Dana [name], female   DOB: April 28, 1961, 63 y.o.   MRN: [id number]  Met with pt to give her the team conference update with goals of independent wheelchair level and discharge date of 6/26. Pt feels this is too soon and is concerned about being ready Discussed having husband come in for education she reports Sat 1:00-3:00 would work and have scheduled him for this time.  Have also text him due to does not listen to VM. He will not be there with her at home due to working 12 hr shifts. Her 70 yo mom will be there but she can not assist her. Will work on discharge needs and messaged team regarding pt's concerns.

## 2024-01-03 DIAGNOSIS — N390 Urinary tract infection, site not specified: Secondary | ICD-10-CM

## 2024-01-03 LAB — URINALYSIS, ROUTINE W REFLEX MICROSCOPIC
Bilirubin Urine: NEGATIVE
Glucose, UA: NEGATIVE mg/dL
Ketones, ur: NEGATIVE mg/dL
Leukocytes,Ua: NEGATIVE
Nitrite: NEGATIVE
Protein, ur: >=300 mg/dL — AB
RBC / HPF: >50 RBC/hpf (ref 0–5)
Specific Gravity, Urine: 1.012 (ref 1.005–1.030)
pH: 5 (ref 5.0–8.0)

## 2024-01-03 LAB — RENAL FUNCTION PANEL
Albumin: 2.3 g/dL — ABNORMAL LOW (ref 3.5–5.0)
Anion gap: 17 — ABNORMAL HIGH (ref 5–15)
BUN: 64 mg/dL — ABNORMAL HIGH (ref 6–20)
CO2: 21 mmol/L — ABNORMAL LOW (ref 22–32)
Calcium: 8.6 mg/dL — ABNORMAL LOW (ref 8.9–10.3)
Chloride: 96 mmol/L — ABNORMAL LOW (ref 98–111)
Creatinine, Ser: 3.54 mg/dL — ABNORMAL HIGH (ref 0.44–1.00)
GFR, Estimated: 14 mL/min — ABNORMAL LOW (ref >60)
Glucose, Bld: 109 mg/dL — ABNORMAL HIGH (ref 70–99)
Phosphorus: 4.9 mg/dL — ABNORMAL HIGH (ref 2.5–4.6)
Potassium: 4.6 mmol/L (ref 3.5–5.1)
Sodium: 134 mmol/L — ABNORMAL LOW (ref 135–145)

## 2024-01-03 LAB — GLUCOSE, CAPILLARY
Glucose-Capillary: 112 mg/dL — ABNORMAL HIGH (ref 70–99)
Glucose-Capillary: 92 mg/dL (ref 70–99)
Glucose-Capillary: 103 mg/dL — ABNORMAL HIGH (ref 70–99)
Glucose-Capillary: 87 mg/dL (ref 70–99)

## 2024-01-03 MED ORDER — CIPROFLOXACIN HCL 500 MG PO TABS
500.0000 mg | ORAL_TABLET | Freq: Every day | ORAL | Status: DC
  Administered 2024-01-04: 500 mg via ORAL
  Filled 2024-01-03: qty 1

## 2024-01-03 MED ORDER — CEPHALEXIN 250 MG PO CAPS
250.0000 mg | ORAL_CAPSULE | ORAL | Status: DC
  Administered 2024-01-03: 250 mg via ORAL
  Filled 2024-01-03: qty 1

## 2024-01-03 NOTE — Progress Notes (Signed)
 Physical Therapy Session Note  Patient Details  Name: Dana [name] MRN: [id number] Date of Birth: 05-Jul-1961  Today's Date: 01/03/2024 PT Individual Time: 1300-1345 PT Individual Time Calculation (min): 45 min   Short Term Goals: Week 1:  PT Short Term Goal 1 (Week 1): Pt will complete bed mobility with distant supervision PT Short Term Goal 2 (Week 1): Pt will complete bed > chair transfer with CGA + LRAD PT Short Term Goal 3 (Week 1): Pt will complete sit to stand with modA + LRAD PT Short Term Goal 4 (Week 1): Pt will complete 88ft WC propulsion with minA  Skilled Therapeutic Interventions/Progress Updates:    Today's treatment focused on improvement of  functional RLE strength, quadriceps recruitment, and transfer quality with FWW. This was accomplished using the exercises and transfers listed below. Pt showed  good tolerance to administered treatment with minimal adverse effects by the end of session, reporting onset of nausea that relieved with rest, nurse was notified. Skilled intervention was utilized via activity modification for pt tolerance with task completion, functional progression/regression promoting best outcomes inline with current rehab goals, as well as moderate verbal/tactile cuing. Continue to progress as tolerated. Alarm was set, call bell and all necessities were in reach at end of session.  Transfers: STS transfer from high surface w/FWW, Min A  Exercises: STS from high surface, FWW Elevated quad set SLR w/ABD, using MRE to promote eccentric control SL Bridging in Fowlers (HOB @ 30d), using slide board on R knee (towel for comfort), secured with gait belt for pt to use as a pull to assist with this exercise   Therapy Documentation Precautions:  Precautions Precautions: Fall, Other (comment) Recall of Precautions/Restrictions: Intact Precaution/Restrictions Comments: LLE would vac, L limb protector Restrictions Weight Bearing Restrictions Per Provider  Order: Yes LLE Weight Bearing Per Provider Order: Non weight bearing  Pain: Pain Assessment Pain Scale: 0-10 Pain Score: 9  Pain Location: Leg Pain Intervention(s): Medication (See eMAR)    Therapy/Group: Individual Therapy  Albesa Huguenin, PT, DPT 01/03/2024, 2:39 PM

## 2024-01-03 NOTE — Progress Notes (Addendum)
 PROGRESS NOTE   Subjective/Complaints: R leg rash improving. She reports burning with urination this AM, she feels like she has UTI.   ROS: Patient denies fever, chills, HA, SOB, CP, abdominal pain, N/V/D + Rash  + Tremor +dysuria  Objective:   No results found.  Recent Labs    01/02/24 0510  WBC 11.4*  HGB 7.6*  HCT 24.2*  PLT 316   Recent Labs    01/02/24 0511 01/03/24 0622  NA 133* 134*  K 4.8 4.6  CL 99 96*  CO2 22 21*  GLUCOSE 129* 109*  BUN 64* 64*  CREATININE 3.57* 3.54*  CALCIUM  8.6* 8.6*    Intake/Output Summary (Last 24 hours) at 01/03/2024 1249 Last data filed at 01/03/2024 0900 Gross per 24 hour  Intake 476 ml  Output --  Net 476 ml         Physical Exam: Vital Signs Blood pressure (!) 150/61, pulse 71, temperature 97.7 F (36.5 C), temperature source Oral, resp. rate 17, height 5' 5 (1.651 m), weight 95.5 kg, SpO2 98%.  Constitutional: No distress . Vital signs reviewed. Sitting in WC in room HEENT: NCAT, EOMI, oral membranes moist Neck: supple Cardiovascular: RRR without murmur. No JVD    Respiratory/Chest: CTA Bilaterally without wheezes or rales. Normal effort    GI/Abdomen: BS +, sl tender, non-distended, bruising Psych: pleasant and cooperative  Skin: Clean and intact without signs of breakdown, abd bruising from heparin  shots Rash on R leg improved today Neuro:  Alert and oriented x 3. Normal insight and awareness. Intact Memory. Normal language and speech. Cranial nerve exam unremarkable. MMT: BUE 4+ to 5/5. LLE limited by vac, pain, at least 3 to 3+/5. RLE 4 to 4+/5. Sensory exam normal for light touch and pain in all 4 limbs. No limb ataxia or cerebellar signs. No abnormal tone appreciated.  .  Mild b/l UE tremor noted with movements of the arm  Prior neuro assessment is c/w today's exam 01/03/2024.   Musculoskeletal: LBKA Shrinker in place, Vac has been removed, incision  CDI, appropriately tender    Images 6/17          Assessment/Plan: 1. Functional deficits which require 3+ hours per day of interdisciplinary therapy in a comprehensive inpatient rehab setting. Physiatrist is providing close team supervision and 24 hour management of active medical problems listed below. Physiatrist and rehab team continue to assess barriers to discharge/monitor patient progress toward functional and medical goals  Care Tool:  Bathing    Body parts bathed by patient: Right arm, Left arm, Chest, Abdomen, Right upper leg, Left upper leg, Face   Body parts bathed by helper: Front perineal area, Buttocks, Right lower leg Body parts n/a: Left lower leg   Bathing assist Assist Level: Moderate Assistance - Patient 50 - 74%     Upper Body Dressing/Undressing Upper body dressing   What is the patient wearing?: Pull over shirt    Upper body assist Assist Level: Set up assist    Lower Body Dressing/Undressing Lower body dressing      What is the patient wearing?: Pants, Orthosis, Ace wrap/stump shrinker     Lower body assist Assist for lower  body dressing: Maximal Assistance - Patient 25 - 49%     Toileting Toileting    Toileting assist Assist for toileting: Maximal Assistance - Patient 25 - 49%     Transfers Chair/bed transfer  Transfers assist  Chair/bed transfer activity did not occur: Safety/medical concerns  Chair/bed transfer assist level: Contact Guard/Touching assist     Locomotion Ambulation   Ambulation assist   Ambulation activity did not occur: Safety/medical concerns (2/2 endurance deficit and WB restrictions)          Walk 10 feet activity   Assist  Walk 10 feet activity did not occur: Safety/medical concerns (2/2 endurance deficit and WB restrictions)        Walk 50 feet activity   Assist Walk 50 feet with 2 turns activity did not occur: Safety/medical concerns (2/2 endurance deficit and WB restrictions)          Walk 150 feet activity   Assist Walk 150 feet activity did not occur: Safety/medical concerns (2/2 endurance deficit and WB restrictions)         Walk 10 feet on uneven surface  activity   Assist Walk 10 feet on uneven surfaces activity did not occur: Safety/medical concerns (2/2 endurance deficit and WB restrictions)         Wheelchair     Assist Is the patient using a wheelchair?: Yes Type of Wheelchair: Manual    Wheelchair assist level: Supervision/Verbal cueing Max wheelchair distance: 100    Wheelchair 50 feet with 2 turns activity    Assist        Assist Level: Supervision/Verbal cueing   Wheelchair 150 feet activity     Assist      Assist Level: Moderate Assistance - Patient 50 - 74%   Blood pressure (!) 150/61, pulse 71, temperature 97.7 F (36.5 C), temperature source Oral, resp. rate 17, height 5' 5 (1.651 m), weight 95.5 kg, SpO2 98%.  Medical Problem List and Plan: 1. 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                -patient may shower if leg is covered, vac pump away from water             -ELOS/Goals: 7-10 days, mod I w/c level, supervision ambulatory level  -Continue CIR therapies including PT, OT   - Expected discharge 6/25  2.  Antithrombotics: -DVT/anticoagulation:  Pharmaceutical: *dc'ed sq heparin  d/t bleeding from sq inj sites  -SCD RLE             -antiplatelet therapy: Aspirin  81 mg daily, Brilinta  90 mg twice daily 3. Pain Management: Neurontin  300 mg 3 times daily, oxycodone  as needed             -pt reports her worst pain is from her low back and related left lumbar radiculopathy. No phantom pain currently                         -continue above meds for now. Encouraged oob                           -therapy can work on ROM, posture, etc.  4. Mood/Behavior/Sleep: Lexapro  10 mg daily, BuSpar  15 mg 3 times daily             -mood is positive              -  antipsychotic agents: N/A 5. Neuropsych/cognition: This patient is capable of making decisions on her own behalf. 6.  Wound Care/ID:  daptomycin  through 12/28/23 for MRSA bacteremia             -MRSA contact precautions             -pt was changed to traditional vac dressing  6/12 as prevena was not holding suction.  6/15-Still no drainage in cannister. Dressing is due for a change Monday.   **Consider removing Vac Monday if no further drainage in cannister.   -Dr. Harden note indicated plan for 1 week wound vac after DC  -6/19 DC wound VAC today 7. Fluids/Electrolytes/Nutrition: Routine in and outs with follow-up chemistries 8.  Acute on chronic anemia.   Hgb with drop today to 7.3. minimal sx             -transfused 1u PRBC 6/13             -recheck hgb up to 8.1             -continue Brilinta  for now  -holding heparin  as above  -fe++ supp  -6/16 HGB 7.4, continue to monitor  -6/19  HGB stable 7.6, continue to monitor  9.  Diabetes mellitus with peripheral neuropathy.  Hemoglobin A1c 10.2.  NovoLog  3 units 3 times daily, Semglee  5 units daily.  Check blood sugars AC and at bedtime 10.  CAD/stenting/angioplasty as well as history of mitral valve replacement.  Continue aspirin  and Brilinta .  Follow-up cardiology services 11.  Diastolic congestive heart failure.  Coreg  9.375 mg twice daily, Demadex  60 mg daily (hold).  Monitor for any signs of fluid overload             -need weights-stable  - 6/17 Coreg  held by nephrology  -6/18-20 stable continue to monitor NO signs of overload   Filed Weights   12/31/23 0640 01/01/24 0537 01/02/24 0634  Weight: 95.1 kg 93.3 kg 95.5 kg   t 12.  GERD.  Protonix  13.  Hyperlipidemia.  Zetia  14.  Chronic hypoxic respiratory failure/OSA.  Continue CPAP 15.  CKD stage IV.  Baseline 2.2-2.4.               6/14 -Cr bumped  to 2.7 and sl to 2.81 today              -may be related to anemia              -continue to hold demadex  6/15- Cr continues to trend  up-- 3.22  -still think this is due to demadex  she has received up until Friday  - push fluids and recheck BMET in am  -if further increase tomorrow will need to contact nephrology 6/16 Nephrology consulted Cr a little higher today 6/17 nephrologist started midodrine  Coreg  held -Foley removed for UA and culture to be completed 6/19 discussed with nephrology regarding pyuria, since Foley just removed and no symptoms continue to monitor for now and will plan to repeat UA in several days. 6/20 BP has been stable, appears midodrine  has been discontinued, creatinine very slowly improving now at 3.54     01/03/2024    4:38 AM 01/02/2024    9:38 PM 01/02/2024    6:34 AM  Vitals with BMI  Weight   210 lbs 9 oz  BMI   35.04  Systolic 150 136   Diastolic 61 51   Pulse 71 65     16.  History of gout.  Allopurinol .  Monitor for any gout flares 17. R leg rash  - Hydrocortisone  cream started.  - Advised her not to use the compression stocking device she was using previously.  Neurology considering IgA -6/20 improving, continue current regimen  18. Urinary retention  -Foley removed, monitor voiding trial - 6/18 will message nursing to ensure regular PVRs/Bladder scans, will also ask about U/A- dont see done yet  6/19 patient did not have elevated bladder scans yesterday, has been continent  6/20 PVR 0 this AM, continent 19. Occasional tremor  -Renal related? Continue to monitor .   6/19 discussed with nephrology not felt to be uremic related.  Possibly due to discontinuation of beta-blocker  6/20 hopefully will improve when beta-blocker reinitiated 20. Dysuria, likely UTI  -Will send urine sample for cx and plan to start abx after this  -Keflex  started 250mg  Q24h, monitor culture- Addendum- DC renal started cipro     Body mass index is 35.04 kg/m.;  LOS: 7 days A FACE TO FACE EVALUATION WAS PERFORMED  Murray Collier 01/03/2024, 12:49 PM

## 2024-01-03 NOTE — Progress Notes (Signed)
 Pt c/o burning when voiding and also urgency/frequency. Pt also had incontinence episodes x 2 overnight. Pt bladder scanned after void resulting in PVR's of 0. Pt informed that providers would be notified.

## 2024-01-03 NOTE — Evaluation (Signed)
 Recreational Therapy Assessment and Plan  Patient Details  Name: Dana [name] MRN: [id number] Date of Birth: Dec 03, 1960 Today's Date: 01/03/2024  Rehab Potential:  Good ELOS:   d/c 6/26  Assessment  Hospital Problem: Principal Problem:   Left below-knee amputee [facility])     Past Medical History:      Past Medical History:  Diagnosis Date   Acquired absence of left great toe (HCC)     Acquired absence of other left toe(s) (HCC)     ACS (acute coronary syndrome) (HCC)     Acute bronchitis due to COVID-19 virus 03/03/2023   Acute on chronic combined systolic and diastolic CHF (congestive heart failure) (HCC) 09/16/2023   Acute respiratory failure with hypoxia (HCC) 07/20/2023   Anemia     Anoxic brain injury (HCC)     Anxiety     CAD (coronary artery disease)      DES mLAD 04/07/15; DES dRCA & DES pPDA 08/30/16; DES mCX & PTCA OM1 09/29/20   Charcot's joint of foot in type 2 diabetes mellitus (HCC) 12/15/2023   Chronic diastolic (congestive) heart failure (HCC)     Chronic pain     Chronic systolic (congestive) heart failure (HCC)     CKD (chronic kidney disease)     Contrast dye induced nephropathy, possible 09/03/2020   COPD (chronic obstructive pulmonary disease) (HCC)     COVID-19 virus infection 03/03/2023   Demand ischemia (HCC)     Diabetes (HCC)type 1     Diastolic CHF (HCC)     Dyspnea     Encounter for screening mammogram for malignant neoplasm of breast 09/16/2023   Family history of breast cancer 10/23/2021   Gastro-esophageal reflux disease without esophagitis     Hyperlipidemia     Hypertension     Hypertensive heart disease with heart failure (HCC)     Hypoxemia     Idiopathic gout, multiple sites     Leukocytosis 03/29/2022   Localized edema     Low back pain     Mitral regurgitation     Mitral regurgitation     Mixed hyperlipidemia     Mixed incontinence     Morbid (severe) obesity due to excess calories (HCC)     Neuromuscular disorder (HCC)      Neuropathy     Non-ST elevation (NSTEMI) myocardial infarction (HCC)      08/29/20, 09/29/20   OSA (obstructive sleep apnea) 09/03/2020   Other specified hypothyroidism     PAF (paroxysmal atrial fibrillation) (HCC)      s/p pulmonary vein isolation by cryoablation 10/04/15 Dr. Jearldine Mina in [facility]   PEA (Pulseless electrical activity) [facility])      ~ 01/13/21 at [facility] during admission DKA, volume overload, possible CAP, hypoxia s/p ACLS with ROSC ~ 10-15   Pneumonia     Primary insomnia     Rheumatoid arthritis (HCC)     Stroke (HCC)     Thyroid  disease     Type 1 diabetes mellitus (HCC)     URI with cough and congestion 08/22/2023   Vitamin D  deficiency          Past Surgical History:       Past Surgical History:  Procedure Laterality Date   ABDOMINAL HYSTERECTOMY        Partial- Still has both ovaries   AMPUTATION Left 12/18/2023    Procedure: AMPUTATION BELOW KNEE;  Surgeon: Timothy Ford, MD;  Location: [facility] OR;  Service: Orthopedics;  Laterality: Left;   ANKLE FUSION Left 11/13/2023    Procedure: HINDFOOT FUSION FIXATION OF ANKLE;  Surgeon: Laneta Pintos, MD;  Location: [facility] OR;  Service: Orthopedics;  Laterality: Left;   CARDIOVASCULAR STRESS TEST   08/13/2014    Nuclear; Normal   CARPAL TUNNEL RELEASE       CATARACT EXTRACTION, BILATERAL       CESAREAN SECTION       CHOLECYSTECTOMY       CORONARY ANGIOPLASTY WITH STENT PLACEMENT        3 blockages/ 1 stent   CORONARY BALLOON ANGIOPLASTY N/A 07/18/2023    Procedure: CORONARY BALLOON ANGIOPLASTY;  Surgeon: Odie Benne, MD;  Location: [facility] INVASIVE CV LAB;  Service: Cardiovascular;  Laterality: N/A;   CORONARY PRESSURE/FFR STUDY N/A 09/07/2022    Procedure: INTRAVASCULAR PRESSURE WIRE/FFR STUDY;  Surgeon: Swaziland, Peter M, MD;  Location[facility] INVASIVE CV LAB;  Service: Cardiovascular;  Laterality: N/A;   CORONARY STENT INTERVENTION N/A 09/29/2020    Procedure: CORONARY STENT INTERVENTION;  Surgeon: Arnoldo Lapping, MD;  Location: [facility] INVASIVE CV LAB;  Service: Cardiovascular;  Laterality: N/A;  CFX   CORONARY STENT INTERVENTION N/A 09/07/2022    Procedure: CORONARY STENT INTERVENTION;  Surgeon: Swaziland, Peter M, MD;  Location: [facility] INVASIVE CV LAB;  Service: Cardiovascular;  Laterality: N/A;   CORONARY STENT INTERVENTION N/A 02/13/2023    Procedure: CORONARY STENT INTERVENTION;  Surgeon: Odie Benne, MD;  Location: [facility] INVASIVE CV LAB;  Service: Cardiovascular;  Laterality: N/A;  LAD   CORONARY ULTRASOUND/IVUS N/A 02/13/2023    Procedure: Coronary Ultrasound/IVUS;  Surgeon: Odie Benne, MD;  Location: [facility] INVASIVE CV LAB;  Service: Cardiovascular;  Laterality: N/A;   CORONARY/GRAFT ACUTE MI REVASCULARIZATION N/A 12/08/2022    Procedure: Coronary/Graft Acute MI Revascularization;  Surgeon: Arleen Lacer, MD;  Location: [facility] INVASIVE CV LAB;  Service: Cardiovascular;  Laterality: N/A;   EYE SURGERY       LEFT HEART CATH AND CORONARY ANGIOGRAPHY N/A 09/29/2020    Procedure: LEFT HEART CATH AND CORONARY ANGIOGRAPHY;  Surgeon: Arnoldo Lapping, MD;  Location: [facility] INVASIVE CV LAB;  Service: Cardiovascular;  Laterality: N/A;   LEFT HEART CATH AND CORONARY ANGIOGRAPHY N/A 09/07/2022    Procedure: LEFT HEART CATH AND CORONARY ANGIOGRAPHY;  Surgeon: Swaziland, Peter M, MD;  Location: [facility] INVASIVE CV LAB;  Service: Cardiovascular;  Laterality: N/A;   LEFT HEART CATH AND CORONARY ANGIOGRAPHY N/A 12/08/2022    Procedure: LEFT HEART CATH AND CORONARY ANGIOGRAPHY;  Surgeon: Arleen Lacer, MD;  Location: [facility] INVASIVE CV LAB;  Service: Cardiovascular;  Laterality: N/A;   LEFT HEART CATH AND CORONARY ANGIOGRAPHY N/A 02/13/2023    Procedure: LEFT HEART CATH AND CORONARY ANGIOGRAPHY;  Surgeon: Odie Benne, MD;  Location: [facility] INVASIVE CV LAB;  Service: Cardiovascular;  Laterality: N/A;   LEFT HEART CATH AND CORONARY ANGIOGRAPHY N/A 07/16/2023    Procedure: LEFT HEART CATH AND CORONARY  ANGIOGRAPHY;  Surgeon: Odie Benne, MD;  Location: [facility] INVASIVE CV LAB;  Service: Cardiovascular;  Laterality: N/A;   RIGHT HEART CATH N/A 04/27/2021    Procedure: RIGHT HEART CATH;  Surgeon: Mardell Shade, MD;  Location: [facility] INVASIVE CV LAB;  Service: Cardiovascular;  Laterality: N/A;   RIGHT/LEFT HEART CATH AND CORONARY ANGIOGRAPHY N/A 01/07/2017    Procedure: Right/Left Heart Cath and Coronary Angiography;  Surgeon: Darlis Eisenmenger, MD;  Location: [facility] INVASIVE CV LAB;  Service: Cardiovascular;  Laterality: N/A;   ROTATOR CUFF REPAIR  TEE WITHOUT CARDIOVERSION N/A 04/28/2021    Procedure: TRANSESOPHAGEAL ECHOCARDIOGRAM (TEE);  Surgeon: Mardell Shade, MD;  Location: [facility] ENDOSCOPY;  Service: Cardiovascular;  Laterality: N/A;   TEE WITHOUT CARDIOVERSION N/A 05/18/2021    Procedure: TRANSESOPHAGEAL ECHOCARDIOGRAM (TEE);  Surgeon: Thukkani, Arun K, MD;  Location: [facility] INVASIVE CV LAB;  Service: Cardiovascular;  Laterality: N/A;   TOE AMPUTATION Left     TRANSCATHETER MITRAL EDGE TO EDGE REPAIR N/A 05/18/2021    Procedure: MITRAL VALVE REPAIR;  Surgeon: Kyra Phy, MD;  Location: [facility] INVASIVE CV LAB;  Service: Cardiovascular;  Laterality: N/A;   TRANSESOPHAGEAL ECHOCARDIOGRAM (CATH LAB) N/A 12/23/2023    Procedure: TRANSESOPHAGEAL ECHOCARDIOGRAM;  Surgeon: Wendie Hamburg, MD;  Location: [facility] INVASIVE CV LAB;  Service: Cardiovascular;  Laterality: N/A;   US  ECHOCARDIOGRAPHY   05/2017    Normal          Assessment & Plan Clinical Impression: Patient is a 63 y.o. year old female admitted to [facility] on 12/14/2023 from [facility] after presenting to the ED 5-29 for fevers, dysuria, and flank pain. She was started on IV fluids and broad-spectrum antibiotics for suspected UTI with CT abdomen and pelvis showing bladder wall thickening on CT, and shortly after was noted to have purulent drainage from the left heel--wound cultures grew MRSA. She was  transferred to [facility], Dr. Julio Ohm consulted and recommended left BKA due to complete collapse of the left ankle after failed fusion, with cellulitis and ulceration in the setting of PVD and uncontrolled diabetes. She underwent left BKA on 6/4. Hospitalization was complicated by severe sepsis secondary to postop left ankle infection on IV vancomycin , Ancef  for E. coli pyelonephritis, acute on chronic HFrEF on diuretics, CKD stage IV, anemia, uncontrolled diabetes with A1c 10.2, severe depression and anxiety followed by psychiatry, and poor postop pain control managed with p.o. oxycodone  and IV Dilaudid .  Patient transferred to CIR on 12/27/2023 .     Pt presents with decreased activity tolerance, decreased functional mobility, decreased balance, feelings of stress Limiting pt's independence with leisure/community pursuits.  Met with pt today to discuss TR services including leisure education, activity analysis/modifications and stress management.  Also discussed the importance of social, emotional, spiritual health in addition to physical health and their effects on overall health and wellness.  Pt stated understanding.   Plan  MIn 1 session >20 minutes during LOS  Recommendations for other services: None   Discharge Criteria: Patient will be discharged from TR if patient refuses treatment 3 consecutive times without medical reason.  If treatment goals not met, if there is a change in medical status, if patient makes no progress towards goals or if patient is discharged from hospital.  The above assessment, treatment plan, treatment alternatives and goals were discussed and mutually agreed upon: by patient  Dana [name] 01/03/2024, 9:49 AM

## 2024-01-03 NOTE — Progress Notes (Signed)
 Physical Therapy Session Note  Patient Details  Name: Dana [name] MRN: [id number] Date of Birth: January 07, 1961  Today's Date: 01/03/2024 PT Individual Time: 0800-0845 PT Individual Time Calculation (min): 45 min   Short Term Goals: Week 1:  PT Short Term Goal 1 (Week 1): Pt will complete bed mobility with distant supervision PT Short Term Goal 2 (Week 1): Pt will complete bed > chair transfer with CGA + LRAD PT Short Term Goal 3 (Week 1): Pt will complete sit to stand with modA + LRAD PT Short Term Goal 4 (Week 1): Pt will complete 63ft WC propulsion with minA  Skilled Therapeutic Interventions/Progress Updates:    Pt recd in bathroom as hand off from NT. Pt reports intense burning and pain in her vulva, accompanied by urinary frequency and incontinence. She is concerned for UTI, medical team made aware.  Pt performed slideboard transfer x 3 with CGA, min a to place and steady board. Pt dressed with set up a for upper body/shoe and min a to pull over hips sitting. Pt then performed W/c pushups x 10 for triceps strength to improve transfers and w/c propulsion. Pt remained in chair at end of session with needs in reach.   Therapy Documentation Precautions:  Precautions Precautions: Fall, Other (comment) Recall of Precautions/Restrictions: Intact Precaution/Restrictions Comments: LLE would vac, L limb protector Restrictions Weight Bearing Restrictions Per Provider Order: Yes LLE Weight Bearing Per Provider Order: Non weight bearing General:   Vital Signs: Therapy Vitals Temp: 97.7 F (36.5 C) Temp Source: Oral Pulse Rate: 71 Resp: 17 BP: (!) 150/61 Patient Position (if appropriate): Sitting Oxygen  Therapy SpO2: 98 % O2 Device: Room Air Pain: Pain Assessment Pain Scale: 0-10 Pain Score: 10-Worst pain ever Pain Location: Abdomen Pain Intervention(s): Medication (See eMAR) Mobility:   Locomotion :    Trunk/Postural Assessment :    Balance:   Exercises:   Other  Treatments:      Therapy/Group: Individual Therapy  Tex Filbert 01/03/2024, 8:22 AM

## 2024-01-03 NOTE — Progress Notes (Signed)
 Physical Therapy Session Note  Patient Details  Name: Dana [name] MRN: [id number] Date of Birth: [birth year]  Today's Date: 01/03/2024 PT Individual Time: 0930-1030 PT Individual Time Calculation (min): 60 min   Short Term Goals: Week 1:  PT Short Term Goal 1 (Week 1): Pt will complete bed mobility with distant supervision PT Short Term Goal 2 (Week 1): Pt will complete bed > chair transfer with CGA + LRAD PT Short Term Goal 3 (Week 1): Pt will complete sit to stand with modA + LRAD PT Short Term Goal 4 (Week 1): Pt will complete 83ft WC propulsion with minA  Skilled Therapeutic Interventions/Progress Updates:    Today's treatment focused on improvement of  WB tolerance of RLE, RLE global strength and transfer quality with WC,slide board, and FWW.  Protective bracing was applied to L residual limb prior to initiating therapy. This was accomplished using the exercises and transfers listed below. Pt showed  great tolerance to administered treatment with no adverse effects by the end of session. Skilled intervention was utilized via activity modification for pt tolerance with task completion, functional progression/regression promoting best outcomes inline with current rehab goals, as well as moderate verbal/tactile cuing. Continue with therapeutic focus on transfer quality and RLE strength to promote independence.  Alarm was set, call bell and all necessities were in reach at the end of session.  Transfers:  Slide board to/from bed and WC, CGA, setup assistance  STS from [facility] w/FWW, Max A  Exercises: STS practice w/FWW, cuing for foot placement and hand placement on [facility] Slide board practice, pt led transfer giving commands for assistance as needed, CGA for safety and setup assistance. Anterior WS onto RLE from seated postion to promote WB tolerance and technique for STS Wheelchair mobility work, push ups, Weight shifts, scooting  Therapy Documentation Precautions:  Precautions Precautions:  Fall, Other (comment) Recall of Precautions/Restrictions: Intact Precaution/Restrictions Comments: LLE would vac, L limb protector Restrictions Weight Bearing Restrictions Per Provider Order: Yes LLE Weight Bearing Per Provider Order: Non weight bearing   Pain: Pain Assessment Pain Scale: 0-10 Pain Score: 9  Pain Location: Leg Pain Intervention(s): Medication (See eMAR)    Therapy/Group: Individual Therapy  Albesa Huguenin, PT, DPT 01/03/2024, 2:31 PM

## 2024-01-03 NOTE — Progress Notes (Signed)
 Occupational Therapy Weekly Progress Note  Patient Details  Name: Dana [name] MRN: [id number] Date of Birth: 30-Oct-1960  Beginning of progress report period: December 28, 2023 End of progress report period: January 03, 2024  Today's Date: 01/03/2024 OT Individual Time: 4098-1191 OT Individual Time Calculation (min): 70 min    Patient has met 2 of 3 short term goals demonstrating increased improvement in ADLs and functional mobility.  Patient continues to demonstrate the following deficits: muscle weakness, decreased cardiorespiratoy endurance, and decreased standing balance and decreased balance strategies and therefore will continue to benefit from skilled OT intervention to enhance overall performance with BADL and iADL.  Patient progressing toward long term goals..  Continue plan of care.  OT Short Term Goals Week 1:  OT Short Term Goal 1 (Week 1): Pt will complete sit to stand at sink  at Max A while completing self care task in order to increase functional performance and standing balance. OT Short Term Goal 1 - Progress (Week 1): Progressing toward goal OT Short Term Goal 2 (Week 1): Pt will complete LB dressing while utilizing either lateral leans or sit to stand with Mod A. OT Short Term Goal 2 - Progress (Week 1): Met OT Short Term Goal 3 (Week 1): Pt will don limb guard with Min A demonstrating improved independence with residual limb management. OT Short Term Goal 3 - Progress (Week 1): Met Week 2:  OT Short Term Goal 1 (Week 2): patient will increase ability to complete LB dressing CGA utilizing lateral leans on EOB OT Short Term Goal 2 (Week 2): patient will complete toilet transfers to [facility] or toilet utilizing slide board with CGA  Skilled Therapeutic Interventions/Progress Updates:      Therapy Documentation Precautions:  Precautions Precautions: Fall, Other (comment) Recall of Precautions/Restrictions: Intact Precaution/Restrictions Comments: LLE would vac, L limb  protector Restrictions Weight Bearing Restrictions Per Provider Order: Yes LLE Weight Bearing Per Provider Order: Non weight bearing  Pain: 6/10 bladder ADL: ADL Eating: Set up Where Assessed-Eating: Bed level Grooming: Setup Where Assessed-Grooming: Bed level Upper Body Bathing: Setup Where Assessed-Upper Body Bathing: Edge of bed Lower Body Bathing: Maximal assistance Where Assessed-Lower Body Bathing: Bed level Upper Body Dressing: Setup Where Assessed-Upper Body Dressing: Edge of bed Lower Body Dressing: Maximal assistance Where Assessed-Lower Body Dressing: Edge of bed Toileting: Dependent (foley) Where Assessed-Toileting: Bed level Toilet Transfer: Not assessed Tub/Shower Transfer: Not assessed Psychologist, counselling Transfer: Not assessed    Therapy/Group: Individual Therapy  D'mariea L Adriannah Steinkamp OTR/L  01/03/2024, 8:09 AM

## 2024-01-03 NOTE — Progress Notes (Signed)
 Nephrology Follow-Up Consult note   Assessment/Recommendations: Dana [name] is a/an 63 y.o. female with a past medical history significant for DM1, CAD, GERD, CHF, COPD, history of cardiac arrest who present w/ gangrenous foot s/p amputation c/b AKI    Nonoliguric AKI on CKD 4: Baseline creatinine 2.2-2.7.  Creatinine got up to 3.7.  Possibly was from low blood pressures - Workup overall benign.  Creatinine starting to improve -Blood pressure now higher.  Stopped midodrine    -given rash consider  IgA especially since she has a fair amount of hematuria and proteinuria.  However diabetic kidney disease seems the most likely plus brilinta  and UTI.   -Continue to monitor for low blood pressures -Encourage p.o. intake  -Monitor Daily I/Os, Daily weight  -Maintain MAP>65 for optimal renal perfusion.    CHF: Volume status appears stable.  No concerns. Holding coreg  for now   Uncontrolled DM1 with hyperglycemia: Management per primary team   Left BKA: Continue with rehab per primary team   Anemia: Likely multifactorial.  Iron  somewhat low but has allergy . Consider transfusion if needed  UTI-  symptomatic-  will start cipro  while culture is cooking      Reche Canales [facility] 01/03/2024 12:49 PM  ___________________________________________________________  CC: Left leg amputation  Interval History/Subjective: is alert and looks well   I have a terrible UTI'    Medications:  Current Facility-Administered Medications  Medication Dose Route Frequency Provider Last Rate Last Admin   acetaminophen  (TYLENOL ) tablet 650 mg  650 mg Oral Q6H PRN Angiulli, Daniel J, PA-C       Or   acetaminophen  (TYLENOL ) suppository 650 mg  650 mg Rectal Q6H PRN Angiulli, Everlyn Hockey, PA-C       allopurinol  (ZYLOPRIM ) tablet 50 mg  50 mg Oral Daily Angiulli, Daniel J, PA-C   50 mg at 01/03/24 0855   aspirin  EC tablet 81 mg  81 mg Oral Daily Angiulli, Daniel J, PA-C   81 mg at  01/03/24 1610   busPIRone  (BUSPAR ) tablet 15 mg  15 mg Oral TID Angiulli, Daniel J, PA-C   15 mg at 01/03/24 9604   Chlorhexidine  Gluconate Cloth 2 % PADS 6 each  6 each Topical Q12H Lylia Sand, MD   6 each at 01/01/24 0500   escitalopram  (LEXAPRO ) tablet 10 mg  10 mg Oral Daily Angiulli, Daniel J, PA-C   10 mg at 01/03/24 5409   ezetimibe  (ZETIA ) tablet 10 mg  10 mg Oral Daily Angiulli, Daniel J, PA-C   10 mg at 01/03/24 8119   gabapentin  (NEURONTIN ) capsule 300 mg  300 mg Oral TID Angiulli, Daniel J, PA-C   300 mg at 01/03/24 0855   hydrocortisone cream 1 %   Topical TID Lylia Sand, MD   Given at 01/03/24 0856   insulin  aspart (novoLOG ) injection 0-6 Units  0-6 Units Subcutaneous TID AC & HS Angiulli, Daniel J, PA-C   1 Units at 01/02/24 1244   insulin  aspart (novoLOG ) injection 3 Units  3 Units Subcutaneous TID WC Angiulli, Daniel J, PA-C   3 Units at 01/03/24 1238   insulin  glargine-yfgn (SEMGLEE ) injection 5 Units  5 Units Subcutaneous Daily Angiulli, Daniel J, PA-C   5 Units at 01/03/24 1478   latanoprost  (XALATAN ) 0.005 % ophthalmic solution 1 drop  1 drop Both Eyes QHS AngiulliEverlyn Hockey, PA-C   1 drop at 01/02/24 2032   LORazepam  (ATIVAN ) tablet 0.5 mg  0.5 mg Oral QID PRN Sterling Eisenmenger,  PA-C   0.5 mg at 01/03/24 0445   oxyCODONE  (Oxy IR/ROXICODONE ) immediate release tablet 7.5 mg  7.5 mg Oral Q4H PRN Sterling Eisenmenger, PA-C   7.5 mg at 01/03/24 1610   pantoprazole  (PROTONIX ) EC tablet 40 mg  40 mg Oral Daily Sterling Eisenmenger, PA-C   40 mg at 01/03/24 9604   polyethylene glycol (MIRALAX  / GLYCOLAX ) packet 17 g  17 g Oral Daily Sterling Eisenmenger, PA-C   17 g at 01/03/24 5409   senna (SENOKOT) tablet 17.2 mg  2 tablet Oral QHS Sterling Eisenmenger, PA-C   17.2 mg at 01/01/24 2022   ticagrelor  (BRILINTA ) tablet 90 mg  90 mg Oral BID Sterling Eisenmenger, PA-C   90 mg at 01/03/24 8119      Review of Systems: 10 systems reviewed and negative except per interval  history/subjective  Physical Exam: Vitals:   01/02/24 2138 01/03/24 0438  BP: (!) 136/51 (!) 150/61  Pulse: 65 71  Resp: 18 17  Temp: 98.8 F (37.1 C) 97.7 F (36.5 C)  SpO2: 98% 98%   Total I/O In: 240 [P.O.:240] Out: -   Intake/Output Summary (Last 24 hours) at 01/03/2024 1249 Last data filed at 01/03/2024 0900 Gross per 24 hour  Intake 476 ml  Output --  Net 476 ml   Constitutional: well-appearing, no acute distress ENMT: ears and nose without scars or lesions, MMM CV: normal rate, 1+ nonpitting edema in the bilateral lower extremities Respiratory: Bilateral chest rise, normal work of breathing Gastrointestinal: soft, non-tender, no palpable masses or hernias Skin: Palpable and nonpalpable purpura predominantly on the right shin and ankle, otherwise no visible lesions or rashes Psych: alert, judgement/insight appropriate, appropriate mood and affect Neuro: Resting tremor in the bilateral hands.  No myoclonic jerking.  No asterixis   Test Results I personally reviewed new and old clinical labs and radiology tests Lab Results  Component Value Date   NA 134 (L) 01/03/2024   K 4.6 01/03/2024   CL 96 (L) 01/03/2024   CO2 21 (L) 01/03/2024   BUN 64 (H) 01/03/2024   CREATININE 3.54 (H) 01/03/2024   GLU 246 03/29/2022   CALCIUM  8.6 (L) 01/03/2024   ALBUMIN  2.3 (L) 01/03/2024   PHOS 4.9 (H) 01/03/2024    CBC Recent Labs  Lab 12/27/23 1829 12/30/23 0503 01/02/24 0510  WBC 11.9* 10.5 11.4*  NEUTROABS  --  8.3*  --   HGB 8.1* 7.4* 7.6*  HCT 26.3* 24.1* 24.2*  MCV 88.3 88.9 89.6  PLT 301 292 316

## 2024-01-03 NOTE — Progress Notes (Signed)
 Patient placed herself on home CPAP unit.  Patient tolerating well.

## 2024-01-04 ENCOUNTER — Inpatient Hospital Stay (HOSPITAL_COMMUNITY)

## 2024-01-04 DIAGNOSIS — M7989 Other specified soft tissue disorders: Secondary | ICD-10-CM | POA: Diagnosis not present

## 2024-01-04 LAB — GLUCOSE, CAPILLARY
Glucose-Capillary: 151 mg/dL — ABNORMAL HIGH (ref 70–99)
Glucose-Capillary: 64 mg/dL — ABNORMAL LOW (ref 70–99)
Glucose-Capillary: 113 mg/dL — ABNORMAL HIGH (ref 70–99)
Glucose-Capillary: 125 mg/dL — ABNORMAL HIGH (ref 70–99)
Glucose-Capillary: 70 mg/dL (ref 70–99)

## 2024-01-04 LAB — RENAL FUNCTION PANEL
Albumin: 2.2 g/dL — ABNORMAL LOW (ref 3.5–5.0)
Anion gap: 8 (ref 5–15)
BUN: 64 mg/dL — ABNORMAL HIGH (ref 6–20)
CO2: 25 mmol/L (ref 22–32)
Calcium: 8.3 mg/dL — ABNORMAL LOW (ref 8.9–10.3)
Chloride: 99 mmol/L (ref 98–111)
Creatinine, Ser: 3.36 mg/dL — ABNORMAL HIGH (ref 0.44–1.00)
GFR, Estimated: 15 mL/min — ABNORMAL LOW (ref >60)
Glucose, Bld: 103 mg/dL — ABNORMAL HIGH (ref 70–99)
Phosphorus: 4.9 mg/dL — ABNORMAL HIGH (ref 2.5–4.6)
Potassium: 3.9 mmol/L (ref 3.5–5.1)
Sodium: 132 mmol/L — ABNORMAL LOW (ref 135–145)

## 2024-01-04 LAB — OSMOLALITY, URINE: Osmolality, Ur: 291 mosm/kg — ABNORMAL LOW (ref 300–900)

## 2024-01-04 LAB — SODIUM, URINE, RANDOM: Sodium, Ur: 11 mmol/L

## 2024-01-04 LAB — OSMOLALITY: Osmolality: 304 mosm/kg — ABNORMAL HIGH (ref 275–295)

## 2024-01-04 MED ORDER — MELATONIN 5 MG PO TABS: 5.0000 mg | ORAL_TABLET | Freq: Every evening | ORAL | Status: DC | PRN

## 2024-01-04 MED ORDER — CIPROFLOXACIN HCL 500 MG PO TABS
500.0000 mg | ORAL_TABLET | Freq: Every day | ORAL | Status: DC
  Administered 2024-01-05 – 2024-01-07 (×3): 500 mg via ORAL
  Filled 2024-01-04 (×3): qty 1

## 2024-01-04 MED ORDER — DARBEPOETIN ALFA 200 MCG/0.4ML IJ SOSY
200.0000 ug | PREFILLED_SYRINGE | INTRAMUSCULAR | Status: DC
  Administered 2024-01-04: 200 ug via SUBCUTANEOUS
  Filled 2024-01-04: qty 0.4

## 2024-01-04 NOTE — Progress Notes (Signed)
 Nephrology Follow-Up Consult note   Assessment/Recommendations: Dana [name] is a/an 63 y.o. female with a past medical history significant for DM1, CAD, GERD, CHF, COPD, history of cardiac arrest who present w/ gangrenous foot s/p amputation c/b AKI    Nonoliguric AKI on CKD 4: Baseline creatinine 2.2-2.7.  Creatinine got up to 3.7.  Possibly was from low blood pressures - Workup overall benign.  Creatinine starting to improve-  also with clinical evidence of UTI-  started cipro  -Blood pressure now higher.  Stopped midodrine    -consider  IgA especially since she has a fair amount of hematuria and proteinuria.  However diabetic kidney disease seems the most likely plus brilinta  and UTI.   -Continue to monitor for low blood pressures -Encourage p.o. intake  -Monitor Daily I/Os, Daily weight  -Maintain MAP>65 for optimal renal perfusion.    CHF: Volume status appears stable.  No concerns. Holding coreg  for now   Uncontrolled DM1 with hyperglycemia: Management per primary team   Left BKA: Continue with rehab per primary team   Anemia: Likely multifactorial.  Iron  somewhat low but has allergy . Consider transfusion if needed-  will give dose of ESA  UTI-  symptomatic-  will start cipro  while culture is cooking -   would be my preference to continue cipro  given that she was symptomatic and had pyuria   Hyponartremia-  very mild-  could be c/w hypervolemic hyponatremia -  consider adding back diuretic tomorrow      Curtis DELENA Heman [facility] 01/04/2024 1:06 PM  ___________________________________________________________  CC: Left leg amputation  Interval History/Subjective:  feels better today -  up with PT-  crt down a little more still -  urine culture was incubated for better growth but not negative- she got keflex  last night and cipro  this AM but has been discontinued    Medications:  Current Facility-Administered Medications  Medication Dose Route  Frequency Provider Last Rate Last Admin   acetaminophen  (TYLENOL ) tablet 650 mg  650 mg Oral Q6H PRN Angiulli, Daniel J, PA-C       Or   acetaminophen  (TYLENOL ) suppository 650 mg  650 mg Rectal Q6H PRN Angiulli, Toribio PARAS, PA-C       allopurinol  (ZYLOPRIM ) tablet 50 mg  50 mg Oral Daily Angiulli, Daniel J, PA-C   50 mg at 01/04/24 9165   aspirin  EC tablet 81 mg  81 mg Oral Daily Angiulli, Daniel J, PA-C   81 mg at 01/04/24 9165   busPIRone  (BUSPAR ) tablet 15 mg  15 mg Oral TID Angiulli, Daniel J, PA-C   15 mg at 01/04/24 9165   Chlorhexidine  Gluconate Cloth 2 % PADS 6 each  6 each Topical Q12H Urbano Albright, MD   6 each at 01/01/24 0500   ezetimibe  (ZETIA ) tablet 10 mg  10 mg Oral Daily Angiulli, Daniel J, PA-C   10 mg at 01/04/24 0834   gabapentin  (NEURONTIN ) capsule 300 mg  300 mg Oral TID Angiulli, Daniel J, PA-C   300 mg at 01/04/24 9165   hydrocortisone  cream 1 %   Topical TID Urbano Albright, MD   Given at 01/04/24 0836   insulin  aspart (novoLOG ) injection 0-6 Units  0-6 Units Subcutaneous TID AC & HS Angiulli, Daniel J, PA-C   1 Units at 01/04/24 9374   insulin  aspart (novoLOG ) injection 3 Units  3 Units Subcutaneous TID WC Angiulli, Daniel J, PA-C   3 Units at 01/04/24 9163   insulin  glargine-yfgn (SEMGLEE ) injection 5 Units  5  Units Subcutaneous Daily Pegge Toribio PARAS, PA-C   5 Units at 01/04/24 9165   latanoprost  (XALATAN ) 0.005 % ophthalmic solution 1 drop  1 drop Both Eyes QHS Pegge Toribio PARAS, PA-C   1 drop at 01/03/24 2219   LORazepam  (ATIVAN ) tablet 0.5 mg  0.5 mg Oral QID PRN Pegge Toribio PARAS, PA-C   0.5 mg at 01/03/24 1359   melatonin tablet 5 mg  5 mg Oral QHS PRN Emeline Search C, DO       oxyCODONE  (Oxy IR/ROXICODONE ) immediate release tablet 7.5 mg  7.5 mg Oral Q4H PRN Pegge Toribio PARAS, PA-C   7.5 mg at 01/04/24 1059   pantoprazole  (PROTONIX ) EC tablet 40 mg  40 mg Oral Daily Pegge Toribio PARAS, PA-C   40 mg at 01/04/24 9165   polyethylene glycol (MIRALAX  /  GLYCOLAX ) packet 17 g  17 g Oral Daily Pegge Toribio PARAS, PA-C   17 g at 01/03/24 9144   senna (SENOKOT) tablet 17.2 mg  2 tablet Oral QHS Pegge Toribio PARAS, PA-C   17.2 mg at 01/01/24 2022   ticagrelor  (BRILINTA ) tablet 90 mg  90 mg Oral BID Pegge Toribio PARAS, PA-C   90 mg at 01/04/24 9165      Review of Systems: 10 systems reviewed and negative except per interval history/subjective  Physical Exam: Vitals:   01/03/24 1921 01/04/24 0513  BP: 128/60 (!) 111/35  Pulse: 63 65  Resp: 16 18  Temp: 97.6 F (36.4 C) 97.8 F (36.6 C)  SpO2: 96% 98%   Total I/O In: 240 [P.O.:240] Out: -   Intake/Output Summary (Last 24 hours) at 01/04/2024 1306 Last data filed at 01/04/2024 0900 Gross per 24 hour  Intake 480 ml  Output --  Net 480 ml   Constitutional: well-appearing, no acute distress ENMT: ears and nose without scars or lesions, MMM CV: normal rate, 1+ nonpitting edema in the bilateral lower extremities Respiratory: Bilateral chest rise, normal work of breathing Gastrointestinal: soft, non-tender, no palpable masses or hernias Skin: Palpable and nonpalpable purpura predominantly on the right shin and ankle, otherwise no visible lesions or rashes Psych: alert, judgement/insight appropriate, appropriate mood and affect Neuro: Resting tremor in the bilateral hands.  No myoclonic jerking.  No asterixis   Test Results I personally reviewed new and old clinical labs and radiology tests Lab Results  Component Value Date   NA 132 (L) 01/04/2024   K 3.9 01/04/2024   CL 99 01/04/2024   CO2 25 01/04/2024   BUN 64 (H) 01/04/2024   CREATININE 3.36 (H) 01/04/2024   GLU 246 03/29/2022   CALCIUM  8.3 (L) 01/04/2024   ALBUMIN  2.2 (L) 01/04/2024   PHOS 4.9 (H) 01/04/2024    CBC Recent Labs  Lab 12/30/23 0503 01/02/24 0510  WBC 10.5 11.4*  NEUTROABS 8.3*  --   HGB 7.4* 7.6*  HCT 24.1* 24.2*  MCV 88.9 89.6  PLT 292 316

## 2024-01-04 NOTE — Progress Notes (Addendum)
 Hypoglycemic Event  CBG: 64  Treatment: 4 oz juice/soda  Symptoms: None  Follow-up CBG: Time:1320 CBG Result:113  Possible Reasons for Event: Unknown  Comments/MD notified: MD notified    Nat Hacker LPN

## 2024-01-04 NOTE — Progress Notes (Signed)
 VASCULAR LAB    Right lower extremity venous duplex has been performed.  See CV proc for preliminary results.   Ann Groeneveld, RVT 01/04/2024, 4:49 PM

## 2024-01-04 NOTE — Progress Notes (Signed)
 Physical Therapy Session Note  Patient Details  Name: Dana [name] MRN: [id number] Date of Birth: 1961-02-15  Today's Date: 01/04/2024 PT Individual Time: 1401-1445 PT Individual Time Calculation (min): 44 min   Short Term Goals: Week 1:  PT Short Term Goal 1 (Week 1): Pt will complete bed mobility with distant supervision PT Short Term Goal 2 (Week 1): Pt will complete bed > chair transfer with CGA + LRAD PT Short Term Goal 3 (Week 1): Pt will complete sit to stand with modA + LRAD PT Short Term Goal 4 (Week 1): Pt will complete 9ft WC propulsion with minA  Skilled Therapeutic Interventions/Progress Updates: Pt present sitting in w/c and handed off from OT.  Pt performed SB transfer to/from drop arm BSC using Dycem for stabilization.  Spouse performing transfers and cues for placement of board and where to support pt and especially forward lean for use of RLE as pivot.  Pt then performed SB transfer w/c <> bed w/ spouse performing and education given by PT.  Pt wheeled to dayroom for ed on some UB there ex including overhead press, chest press and biceps curls w/ 2# db.  Pt able to perform w/c push-ups now and proud of her progression w/ this.  Pt remained sitting in w/c w/ family present and all needs in reach.     Therapy Documentation Precautions:  Precautions Precautions: Fall, Other (comment) Recall of Precautions/Restrictions: Intact Precaution/Restrictions Comments: LLE would vac, L limb protector Restrictions Weight Bearing Restrictions Per Provider Order: Yes LLE Weight Bearing Per Provider Order: Non weight bearing General:   Vital Signs:   Pain:8/10 L residual limb Pain Assessment Pain Scale: 0-10 Pain Score: 8  Pain Location: Leg Pain Intervention(s): Medication (See eMAR)    Therapy/Group: Individual Therapy  Ritaj Dullea P Vandora Jaskulski 01/04/2024, 2:45 PM

## 2024-01-04 NOTE — Progress Notes (Signed)
 Occupational Therapy Session Note  Patient Details  Name: Dana [name] MRN: [id number] Date of Birth: [birth year]  Today's Date: 01/04/2024 OT Individual Time: 8691-8591 OT Individual Time Calculation (min): 60 min    Short Term Goals: Week 2:  OT Short Term Goal 1 (Week 2): patient will increase ability to complete LB dressing CGA utilizing lateral leans on EOB OT Short Term Goal 2 (Week 2): patient will complete toilet transfers to [facility] or toilet utilizing slide board with CGA  Skilled Therapeutic Interventions/Progress Updates:    Pt received sitting in the w/c with moderate pain in her residual limb and back, agreeable to OT session. Rn notified and delivered pain medication during session. Family education session completed with pt and her daughter, husband, and mother. Verbal education provided re fall risk reduction, energy conservation strategies, home carryover of transfer training, ADLs, and IADLs. Demonstration and hands on training completed for pt performance of UB/LB bathing and dressing at  level, toileting hygiene and transfers, and shower transfers. Provided education and demonstration on DME use recommendations at home. Extensive problem solving through independence with toileting tasks. Pt completed multiple transfers to/from bed, w/c. BSC during session. Husband with hands on practice. Provided residual limb education at length. Pt passed off to PT in room.    Therapy Documentation Precautions:  Precautions Precautions: Fall, Other (comment) Recall of Precautions/Restrictions: Intact Precaution/Restrictions Comments: LLE would vac, L limb protector Restrictions Weight Bearing Restrictions Per Provider Order: Yes LLE Weight Bearing Per Provider Order: Non weight bearing   Therapy/Group: Individual Therapy  Nena VEAR Moats 01/04/2024, 6:35 AM

## 2024-01-04 NOTE — Progress Notes (Signed)
 PROGRESS NOTE   Subjective/Complaints: No events overnight.  Continues to have dysuria, and notes dark discoloration and foul odor from her urine.  Discussed results of urinalysis, some hematuria but no infection; likely related to renal disease seen on recent ultrasound  Patient says she has been more confused, talking out of my head.  + Hallucinations, seeing people who are not here + insomnia - waking up intermittently at nighttime, compliant with CPAP   Vitals stable     01/04/2024    5:13 AM 01/03/2024    7:21 PM 01/03/2024    4:38 AM  Vitals with BMI  Weight 209 lbs 11 oz    BMI 34.89    Systolic 111 128 849  Diastolic 35 60 61  Pulse 65 63 71    Recent Labs    01/03/24 1649 01/03/24 2025 01/04/24 0618  GLUCAP 103* 87 151*     P.o. intakes appropriate  Continent of bladder, urinalysis yesterday negative for UTI Last BM/20, medium  ROS: Patient denies fever, chills, HA, SOB, CP, abdominal pain, N/V/D + Rash  + Tremor + dysuria + Hallucinations + Insomnia  Objective:   No results found.  Recent Labs    01/02/24 0510  WBC 11.4*  HGB 7.6*  HCT 24.2*  PLT 316   Recent Labs    01/03/24 0622 01/04/24 0718  NA 134* 132*  K 4.6 3.9  CL 96* 99  CO2 21* 25  GLUCOSE 109* 103*  BUN 64* 64*  CREATININE 3.54* 3.36*  CALCIUM  8.6* 8.3*    Intake/Output Summary (Last 24 hours) at 01/04/2024 1051 Last data filed at 01/04/2024 0900 Gross per 24 hour  Intake 480 ml  Output --  Net 480 ml         Physical Exam: Vital Signs Blood pressure (!) 111/35, pulse 65, temperature 97.8 F (36.6 C), temperature source Oral, resp. rate 18, height 5' 5 (1.651 m), weight 95.1 kg, SpO2 98%.  Constitutional: No distress . Vital signs reviewed.  Sitting up in bedside chair. HEENT: NCAT, EOMI, oral membranes moist Neck: supple Cardiovascular: RRR without murmur. No JVD    Respiratory/Chest: CTA Bilaterally  without wheezes or rales. Normal effort    GI/Abdomen: BS +, sl tender, non-distended, bruising Psych: pleasant and cooperative  Skin: Clean and intact without signs of breakdown, abd bruising from heparin  shots.  Rash on right leg appearing more disseminated as below  Neuro:  Alert and oriented x 3. Normal insight and awareness. Intact Memory. Normal language and speech. Cranial nerve exam unremarkable. MMT: BUE 4+ to 5/5. LLE limited by vac, pain, at least 3 to 3+/5. RLE 4 to 4+/5. Sensory exam normal for light touch and pain in all 4 limbs. No limb ataxia or cerebellar signs. No abnormal tone appreciated.  .  Mild b/l UE tremor noted with movements of the arm Musculoskeletal: LBKA Shrinker in place--well-formed.  Incision not examined.   Images 6/17       Rash more disseminated along right lower extremity, and with larger nonblanchable nodules with some blanchable erythema in between.  No textural changes or raised lesions.  Nontender, no reduced or altered sensation, no itching.  Assessment/Plan:  1. Functional deficits which require 3+ hours per day of interdisciplinary therapy in a comprehensive inpatient rehab setting. Physiatrist is providing close team supervision and 24 hour management of active medical problems listed below. Physiatrist and rehab team continue to assess barriers to discharge/monitor patient progress toward functional and medical goals  Care Tool:  Bathing    Body parts bathed by patient: Right arm, Left upper leg, Left arm, Right lower leg, Chest, Abdomen, Front perineal area, Face, Left lower leg, Buttocks, Right upper leg (sititng in chair/ EOB. sponge bath)   Body parts bathed by helper: Front perineal area, Buttocks, Right lower leg Body parts n/a: Left lower leg   Bathing assist Assist Level: Contact Guard/Touching assist     Upper Body Dressing/Undressing Upper body dressing   What is the patient wearing?: Pull over shirt    Upper body assist  Assist Level: Set up assist    Lower Body Dressing/Undressing Lower body dressing      What is the patient wearing?: Pants, Orthosis, Ace wrap/stump shrinker     Lower body assist Assist for lower body dressing: Minimal Assistance - Patient > 75%     Toileting Toileting    Toileting assist Assist for toileting: Minimal Assistance - Patient > 75%     Transfers Chair/bed transfer  Transfers assist  Chair/bed transfer activity did not occur: Safety/medical concerns  Chair/bed transfer assist level: Contact Guard/Touching assist     Locomotion Ambulation   Ambulation assist   Ambulation activity did not occur: Safety/medical concerns (2/2 endurance deficit and WB restrictions)          Walk 10 feet activity   Assist  Walk 10 feet activity did not occur: Safety/medical concerns (2/2 endurance deficit and WB restrictions)        Walk 50 feet activity   Assist Walk 50 feet with 2 turns activity did not occur: Safety/medical concerns (2/2 endurance deficit and WB restrictions)         Walk 150 feet activity   Assist Walk 150 feet activity did not occur: Safety/medical concerns (2/2 endurance deficit and WB restrictions)         Walk 10 feet on uneven surface  activity   Assist Walk 10 feet on uneven surfaces activity did not occur: Safety/medical concerns (2/2 endurance deficit and WB restrictions)         Wheelchair     Assist Is the patient using a wheelchair?: Yes Type of Wheelchair: Manual    Wheelchair assist level: Supervision/Verbal cueing Max wheelchair distance: 100    Wheelchair 50 feet with 2 turns activity    Assist        Assist Level: Supervision/Verbal cueing   Wheelchair 150 feet activity     Assist      Assist Level: Moderate Assistance - Patient 50 - 74%   Blood pressure (!) 111/35, pulse 65, temperature 97.8 F (36.6 C), temperature source Oral, resp. rate 18, height 5' 5 (1.651 m), weight  95.1 kg, SpO2 98%.  Medical Problem List and Plan: 1. 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                -patient may shower if leg is covered, vac pump away from water             -ELOS/Goals: 7-10 days, mod I w/c level, supervision ambulatory level  -Continue CIR therapies including PT, OT   - Expected discharge 6/25  2.  Antithrombotics: -DVT/anticoagulation:  Pharmaceutical: *dc'ed sq heparin  d/t bleeding from sq inj sites  -SCD RLE             -antiplatelet therapy: Aspirin  81 mg daily, Brilinta  90 mg twice daily  6/21: Duplex right lower extremity ordered due to purpuric rash; very isolated distribution so little concern for drug-related reaction  3. Pain Management: Neurontin  300 mg 3 times daily, oxycodone  as needed             -pt reports her worst pain is from her low back and related left lumbar radiculopathy. No phantom pain currently                         -continue above meds for now. Encouraged oob                           -therapy can work on ROM, posture, etc.  4. Mood/Behavior/Sleep/hallucinations: Lexapro  10 mg daily, BuSpar  15 mg 3 times daily             -mood is positive             -antipsychotic agents: N/A  6-21: DC Lexapro  due to hyponatremia. Add melatonin for sleep 5 mg PRN.  Patient does not want to have your sleep medications due to prior poor experiences.  No evidence of active hallucinations on exam.  5. Neuropsych/cognition: This patient is capable of making decisions on her own behalf. 6.  Wound Care/ID:  daptomycin  through 12/28/23 for MRSA bacteremia             -MRSA contact precautions             -pt was changed to traditional vac dressing  6/12 as prevena was not holding suction.  6/15-Still no drainage in cannister. Dressing is due for a change Monday.   **Consider removing Vac Monday if no further drainage in cannister.   -Dr. Harden note indicated plan for 1 week wound vac after DC  -6/19  DC wound VAC today   7. Fluids/Electrolytes/Nutrition: Routine in and outs with follow-up chemistries 8.  Acute on chronic anemia.   Hgb with drop today to 7.3. minimal sx             -transfused 1u PRBC 6/13             -recheck hgb up to 8.1             -continue Brilinta  for now  -holding heparin  as above  -fe++ supp  -6/16 HGB 7.4, continue to monitor  -6/19  HGB stable 7.6, continue to monitor    9.  Diabetes mellitus with peripheral neuropathy.  Hemoglobin A1c 10.2.  NovoLog  3 units 3 times daily, Semglee  5 units daily.  Check blood sugars AC and at bedtime 10.  CAD/stenting/angioplasty as well as history of mitral valve replacement.  Continue aspirin  and Brilinta .  Follow-up cardiology services 11.  Diastolic congestive heart failure.  Coreg  9.375 mg twice daily, Demadex  60 mg daily (hold).  Monitor for any signs of fluid overload             -need weights-stable  - 6/17 Coreg  held by nephrology  -6/18-20 stable continue to monitor NO signs of overload   Filed Weights   01/01/24 0537 01/02/24 0634 01/04/24 0513  Weight: 93.3 kg 95.5 kg 95.1 kg   t 12.  GERD.  Protonix  13.  Hyperlipidemia.  Zetia  14.  Chronic hypoxic respiratory failure/OSA.  Continue CPAP 15.  CKD stage IV.  Baseline 2.2-2.4.               6/14 -Cr bumped  to 2.7 and sl to 2.81 today              -may be related to anemia              -continue to hold demadex  6/15- Cr continues to trend up-- 3.22  -still think this is due to demadex  she has received up until Friday  - push fluids and recheck BMET in am  -if further increase tomorrow will need to contact nephrology 6/16 Nephrology consulted Cr a little higher today 6/17 nephrologist started midodrine  Coreg  held -Foley removed for UA and culture to be completed 6/19 discussed with nephrology regarding pyuria, since Foley just removed and no symptoms continue to monitor for now and will plan to repeat UA in several days. 6/20 BP has been stable, appears  midodrine  has been discontinued, creatinine very slowly improving now at 3.54 6-21: Creatinine downtrending, 3.36 today.  BUN stable 64.  Worsening hyponatremia at 132;     01/04/2024    5:13 AM 01/03/2024    7:21 PM 01/03/2024    4:38 AM  Vitals with BMI  Weight 209 lbs 11 oz    BMI 34.89    Systolic 111 128 849  Diastolic 35 60 61  Pulse 65 63 71    16.  History of gout.  Allopurinol .  Monitor for any gout flares 17. R leg rash  - Hydrocortisone  cream started.  - Advised her not to use the compression stocking device she was using previously.  Neurology considering IgA -6/20 improving, continue current regimen  18. Urinary retention  -Foley removed, monitor voiding trial - 6/18 will message nursing to ensure regular PVRs/Bladder scans, will also ask about U/A- dont see done yet  6/19 patient did not have elevated bladder scans yesterday, has been continent  6/20 PVR 0 this AM, continent  6/21: No PVRs.  Continent of urine.  19. Occasional tremor  -Renal related? Continue to monitor .   6/19 discussed with nephrology not felt to be uremic related.  Possibly due to discontinuation of beta-blocker  6/20 hopefully will improve when beta-blocker reinitiated  20. Dysuria, likely UTI  -Will send urine sample for cx and plan to start abx after this  -Keflex  started 250mg  Q24h, monitor culture- Addendum- DC renal started cipro   6/21: Urinalysis with large blood, rare bacteria, no leukocyte esterase or nitrates.  DC Cipro . Patient Cr too high for analgesic.    Body mass index is 34.89 kg/m.;  21.  Hyponatremia.  Downtrending since admission, at 132 today.  - DC Lexapro   - Placed 1200 cc fluid restriction  - Urine studies ordered for today to evaluate; not on any diuretics  LOS: 8 days A FACE TO FACE EVALUATION WAS PERFORMED  Joesph JAYSON Likes 01/04/2024, 10:51 AM

## 2024-01-05 LAB — CBC WITH DIFFERENTIAL/PLATELET
Abs Immature Granulocytes: 0.06 10*3/uL (ref 0.00–0.07)
Basophils Absolute: 0.1 10*3/uL (ref 0.0–0.1)
Basophils Relative: 1 %
Eosinophils Absolute: 0.4 10*3/uL (ref 0.0–0.5)
Eosinophils Relative: 4 %
HCT: 24.2 % — ABNORMAL LOW (ref 36.0–46.0)
Hemoglobin: 7.5 g/dL — ABNORMAL LOW (ref 12.0–15.0)
Immature Granulocytes: 1 %
Lymphocytes Relative: 14 %
Lymphs Abs: 1.3 10*3/uL (ref 0.7–4.0)
MCH: 27.7 pg (ref 26.0–34.0)
MCHC: 31 g/dL (ref 30.0–36.0)
MCV: 89.3 fL (ref 80.0–100.0)
Monocytes Absolute: 0.7 10*3/uL (ref 0.1–1.0)
Monocytes Relative: 8 %
Neutro Abs: 6.4 10*3/uL (ref 1.7–7.7)
Neutrophils Relative %: 72 %
Platelets: 272 10*3/uL (ref 150–400)
RBC: 2.71 MIL/uL — ABNORMAL LOW (ref 3.87–5.11)
RDW: 17.3 % — ABNORMAL HIGH (ref 11.5–15.5)
WBC: 8.9 10*3/uL (ref 4.0–10.5)
nRBC: 0 % (ref 0.0–0.2)

## 2024-01-05 LAB — GLUCOSE, CAPILLARY
Glucose-Capillary: 106 mg/dL — ABNORMAL HIGH (ref 70–99)
Glucose-Capillary: 68 mg/dL — ABNORMAL LOW (ref 70–99)
Glucose-Capillary: 109 mg/dL — ABNORMAL HIGH (ref 70–99)
Glucose-Capillary: 150 mg/dL — ABNORMAL HIGH (ref 70–99)
Glucose-Capillary: 178 mg/dL — ABNORMAL HIGH (ref 70–99)

## 2024-01-05 LAB — BASIC METABOLIC PANEL WITH GFR
Anion gap: 10 (ref 5–15)
BUN: 66 mg/dL — ABNORMAL HIGH (ref 6–20)
CO2: 25 mmol/L (ref 22–32)
Calcium: 8.4 mg/dL — ABNORMAL LOW (ref 8.9–10.3)
Chloride: 99 mmol/L (ref 98–111)
Creatinine, Ser: 3.29 mg/dL — ABNORMAL HIGH (ref 0.44–1.00)
GFR, Estimated: 15 mL/min — ABNORMAL LOW (ref >60)
Glucose, Bld: 123 mg/dL — ABNORMAL HIGH (ref 70–99)
Potassium: 4.8 mmol/L (ref 3.5–5.1)
Sodium: 134 mmol/L — ABNORMAL LOW (ref 135–145)

## 2024-01-05 NOTE — Progress Notes (Deleted)
 Subjective:  Patient ID: Dana [name], female    DOB: Jun 15, 1961  Age: 63 y.o. MRN: [id number]  No chief complaint on file.   HPI:     10/24/2023   10:20 AM 10/04/2023   10:55 AM 07/29/2023   10:57 AM 07/12/2023   10:39 AM 05/13/2023    3:54 PM  Depression screen PHQ 2/9  Decreased Interest 1 1 0 3 1  Down, Depressed, Hopeless 2 2 0 2 0  PHQ - 2 Score 3 3 0 5 1  Altered sleeping 3 3 3 3    Tired, decreased energy 3 3 1 3    Change in appetite 3 3 0 3   Feeling bad or failure about yourself  2 2 2  0   Trouble concentrating 1 1 1  0   Moving slowly or fidgety/restless 0 0 0 0   Suicidal thoughts 0 0 0 0   PHQ-9 Score 15 15 7 14    Difficult doing work/chores  Extremely dIfficult Somewhat difficult Somewhat difficult         10/24/2023   10:21 AM  Fall Risk   Falls in the past year? 0  Number falls in past yr: 0  Injury with Fall? 0  Risk for fall due to : No Fall Risks;Impaired balance/gait;Impaired mobility  Follow up Falls prevention discussed;Education provided;Falls evaluation completed    Patient Care Team: Cox, Abigail, MD as PCP - General (Family Medicine) Bensimhon, Toribio SAUNDERS, MD as PCP - Cardiology (Cardiology) Jeneal Danita Macintosh, MD as Consulting Physician (Allergy ) Bensimhon, Toribio SAUNDERS, MD as Consulting Physician (Cardiology) Gregory Kent, MD as Referring Physician (Gastroenterology) Claudene Elsie JUDITHANN Mickey., MD as Consulting Physician (Endocrinology) Myrick Elspeth PARAS., DPM as Consulting Physician (Podiatry) Mai Lynwood FALCON, MD as Consulting Physician (Rheumatology) Sadiq, Sameea, MD as Referring Physician (Nephrology) Wendel Lurena POUR, MD as Consulting Physician (Cardiology) Cornelius Arianna DEL, MD as Consulting Physician (Oncology) Nathanael Deatrice Barrio, MD as Referring Physician Claudene Elsie JUDITHANN Mickey., MD (Endocrinology) Frances Ozell RAMAN, LCSW as [facility] Care Management (Licensed Clinical Social Worker) [facility]  ([facility] [place]   Review of Systems  Current Facility-Administered Medications on File Prior to Visit  Medication Dose Route Frequency Provider Last Rate Last Admin   acetaminophen  (TYLENOL ) tablet 650 mg  650 mg Oral Q6H PRN Angiulli, Daniel J, PA-C   650 mg at 01/04/24 1321   Or   acetaminophen  (TYLENOL ) suppository 650 mg  650 mg Rectal Q6H PRN Angiulli, Daniel J, PA-C       allopurinol  (ZYLOPRIM ) tablet 50 mg  50 mg Oral Daily Angiulli, Daniel J, PA-C   50 mg at 01/05/24 0813   aspirin  EC tablet 81 mg  81 mg Oral Daily Angiulli, Daniel J, PA-C   81 mg at 01/05/24 0813   busPIRone  (BUSPAR ) tablet 15 mg  15 mg Oral TID Angiulli, Daniel J, PA-C   15 mg at 01/05/24 0813   Chlorhexidine  Gluconate Cloth 2 % PADS 6 each  6 each Topical Q12H Urbano Albright, MD   6 each at 01/01/24 0500   ciprofloxacin  (CIPRO ) tablet 500 mg  500 mg Oral Q breakfast Goldsborough, Kellie, MD   500 mg at 01/05/24 0813   Darbepoetin Alfa  (ARANESP ) injection 200 mcg  200 mcg Subcutaneous Q Sat-1800 Goldsborough, Kellie, MD   200 mcg at 01/04/24 1826   ezetimibe  (ZETIA ) tablet 10 mg  10 mg Oral Daily Pegge Toribio PARAS, PA-C   10 mg at 01/05/24 [id number]  gabapentin  (NEURONTIN ) capsule 300 mg  300 mg Oral TID Angiulli, Daniel J, PA-C   300 mg at 01/05/24 0813   hydrocortisone  cream 1 %   Topical TID Urbano Albright, MD   Given at 01/05/24 0816   insulin  aspart (novoLOG ) injection 0-6 Units  0-6 Units Subcutaneous TID AC & HS Angiulli, Daniel J, PA-C   1 Units at 01/04/24 9374   insulin  aspart (novoLOG ) injection 3 Units  3 Units Subcutaneous TID WC Angiulli, Daniel J, PA-C   3 Units at 01/05/24 9183   insulin  glargine-yfgn (SEMGLEE ) injection 5 Units  5 Units Subcutaneous Daily Angiulli, Daniel J, PA-C   5 Units at 01/05/24 9182   latanoprost  (XALATAN ) 0.005 % ophthalmic solution 1 drop  1 drop Both Eyes QHS AngiulliToribio PARAS, PA-C   1 drop at 01/04/24 2054   LORazepam  (ATIVAN ) tablet 0.5 mg   0.5 mg Oral QID PRN Angiulli, Daniel J, PA-C   0.5 mg at 01/03/24 1359   melatonin tablet 5 mg  5 mg Oral QHS PRN Engler, Morgan C, DO       oxyCODONE  (Oxy IR/ROXICODONE ) immediate release tablet 7.5 mg  7.5 mg Oral Q4H PRN Angiulli, Daniel J, PA-C   7.5 mg at 01/05/24 1255   pantoprazole  (PROTONIX ) EC tablet 40 mg  40 mg Oral Daily Angiulli, Daniel J, PA-C   40 mg at 01/05/24 0813   polyethylene glycol (MIRALAX  / GLYCOLAX ) packet 17 g  17 g Oral Daily Pegge Toribio PARAS, PA-C   17 g at 01/03/24 0855   senna (SENOKOT) tablet 17.2 mg  2 tablet Oral QHS Angiulli, Daniel J, PA-C   17.2 mg at 01/01/24 2022   ticagrelor  (BRILINTA ) tablet 90 mg  90 mg Oral BID Angiulli, Daniel J, PA-C   90 mg at 01/05/24 9186   Current Outpatient Medications on File Prior to Visit  Medication Sig Dispense Refill   acetaminophen  (TYLENOL ) 500 MG tablet Take 1,000 mg by mouth every 6 (six) hours as needed for mild pain (pain score 1-3) or headache.     allopurinol  (ZYLOPRIM ) 100 MG tablet Take 0.5 tablets (50 mg total) by mouth daily. 90 tablet 1   aspirin  EC 81 MG tablet Take 1 tablet (81 mg total) by mouth daily. Swallow whole. 30 tablet 12   busPIRone  (BUSPAR ) 15 MG tablet Take 15 mg by mouth 3 (three) times daily.     carvedilol  (COREG ) 6.25 MG tablet Take 1.5 tablets (9.375 mg total) by mouth 2 (two) times daily. 90 tablet 3   Continuous Glucose Sensor (FREESTYLE LIBRE 2 SENSOR) MISC Inject 1 device into skin every 14 (fourteen) days. 2 each 2   daptomycin  (CUBICIN ) 700-0.9 MG/100ML-% SOLN Inject 100 mLs (700 mg total) into the vein every other day.     EPINEPHrine  0.3 mg/0.3 mL IJ SOAJ injection Use as directed for life-threatening allergic reaction. 4 each 3   escitalopram  (LEXAPRO ) 10 MG tablet Take 1 tablet (10 mg total) by mouth daily.     Evolocumab  (REPATHA  SURECLICK) 140 MG/ML SOAJ Inject 140 mg into the skin every 14 (fourteen) days. 6 mL 1   ezetimibe  (ZETIA ) 10 MG tablet Take 1 tablet (10 mg total) by  mouth every evening. 90 tablet 1   gabapentin  (NEURONTIN ) 300 MG capsule Take 1 capsule (300 mg total) by mouth 3 (three) times daily. 90 capsule 1   Glucagon  (GVOKE HYPOPEN  2-PACK) 0.5 MG/0.1ML SOAJ Inject 0.5 mg into the skin daily as needed. (Patient taking differently: Inject  0.5 mg into the skin daily as needed (hypoglycemia).) 0.2 mL 5   [Paused] insulin  aspart (NOVOLOG  FLEXPEN) 100 UNIT/ML FlexPen Inject 2 - 10 Units before meals based on SSI. 15 mL 3   insulin  aspart (NOVOLOG ) 100 UNIT/ML injection Inject 0-6 Units into the skin 4 (four) times daily -  before meals and at bedtime.     insulin  aspart (NOVOLOG ) 100 UNIT/ML injection Inject 3 Units into the skin 3 (three) times daily with meals.     [Paused] insulin  glargine (LANTUS  SOLOSTAR) 100 UNIT/ML Solostar Pen Inject 45 Units into the skin daily. (Patient taking differently: Inject 30 Units into the skin daily.)     insulin  glargine-yfgn (SEMGLEE ) 100 UNIT/ML injection Inject 0.05 mLs (5 Units total) into the skin daily.     Insulin  Pen Needle (BD PEN NEEDLE NANO 2ND GEN) 32G X 4 MM MISC Inject 1 each into the skin in the morning, at noon, in the evening, and at bedtime. 600 each 3   [Paused] isosorbide  mononitrate (IMDUR ) 30 MG 24 hr tablet Take 1 tablet (30 mg total) by mouth daily. 30 tablet 5   latanoprost  (XALATAN ) 0.005 % ophthalmic solution Place 1 drop into both eyes at bedtime. 2.5 mL 0   linaclotide  (LINZESS ) 145 MCG CAPS capsule Take 1 capsule (145 mcg total) by mouth daily before breakfast. (Patient taking differently: Take 145 mcg by mouth as needed (constipation).) 90 capsule 1   LORazepam  (ATIVAN ) 0.5 MG tablet Take 1 tablet (0.5 mg total) by mouth daily as needed for anxiety. 90 tablet 1   magnesium  oxide (MAG-OX) 400 MG tablet Take 2 tablets (800 mg total) by mouth daily. Take 2 (400 mg) daily=800 mg 180 tablet 1   Menthol -Methyl Salicylate  (ICY HOT EXTRA STRENGTH) 10-30 % CREA Apply 1 Application topically as needed  (bilateral hips and knees). 85 g 1   nitroGLYCERIN  (NITROSTAT ) 0.4 MG SL tablet DISSOLVE ONE TABLET UNDER THE TONGUE EVERY 5 MINUTES AS NEEDED FOR CHEST PAIN.  DO NOT EXCEED A TOTAL OF 3 DOSES IN 15 MINUTES 50 tablet 3   Omega-3 Fatty Acids (FISH OIL PO) Take 1 capsule by mouth daily.     oxyCODONE  7.5 MG TABS Take 7.5 mg by mouth every 4 (four) hours as needed for moderate pain (pain score 4-6).     OXYGEN  Inhale 2 L/min into the lungs at bedtime.     pantoprazole  (PROTONIX ) 40 MG tablet Take 1 tablet (40 mg total) by mouth daily. 90 tablet 1   polyethylene glycol powder (GLYCOLAX /MIRALAX ) 17 GM/SCOOP powder Take 1 capful (17 g) with water by mouth daily. 238 g 0   [Paused] potassium chloride  SA (KLOR-CON  M) 20 MEQ tablet Take 1 tablet (20 mEq total) by mouth daily.     Probiotic CAPS Take 1 capsule by mouth daily.     promethazine  (PHENERGAN ) 25 MG tablet Take 1 tablet (25 mg total) by mouth daily as needed. 90 tablet 1   ticagrelor  (BRILINTA ) 90 MG TABS tablet Take 1 tablet (90 mg total) by mouth 2 (two) times daily. 180 tablet 1   [Paused] torsemide  (DEMADEX ) 20 MG tablet Take 3 tablets (60 mg total) by mouth daily. 90 tablet 3   vitamin D3 (CHOLECALCIFEROL ) 25 MCG tablet Take 2 tablets (2,000 Units total) by mouth 2 (two) times daily. 120 tablet 2   Past Medical History:  Diagnosis Date   Acquired absence of left great toe (HCC)    Acquired absence of other left toe(s) (HCC)  ACS (acute coronary syndrome) (HCC)    Acute bronchitis due to COVID-19 virus 03/03/2023   Acute on chronic combined systolic and diastolic CHF (congestive heart failure) (HCC) 09/16/2023   Acute respiratory failure with hypoxia (HCC) 07/20/2023   Anemia    Anoxic brain injury (HCC)    Anxiety    CAD (coronary artery disease)    DES mLAD 04/07/15; DES dRCA & DES pPDA 08/30/16; DES mCX & PTCA OM1 09/29/20   Charcot's joint of foot in type 2 diabetes mellitus (HCC) 12/15/2023   Chronic diastolic (congestive) heart  failure (HCC)    Chronic pain    Chronic systolic (congestive) heart failure (HCC)    CKD (chronic kidney disease)    Contrast dye induced nephropathy, possible 09/03/2020   COPD (chronic obstructive pulmonary disease) (HCC)    COVID-19 virus infection 03/03/2023   Demand ischemia (HCC)    Diabetes (HCC)type 1    Diastolic CHF (HCC)    Dyspnea    Encounter for screening mammogram for malignant neoplasm of breast 09/16/2023   Family history of breast cancer 10/23/2021   Gastro-esophageal reflux disease without esophagitis    Hyperlipidemia    Hypertension    Hypertensive heart disease with heart failure (HCC)    Hypoxemia    Idiopathic gout, multiple sites    Leukocytosis 03/29/2022   Localized edema    Low back pain    Mitral regurgitation    Mitral regurgitation    Mixed hyperlipidemia    Mixed incontinence    Morbid (severe) obesity due to excess calories (HCC)    Neuromuscular disorder (HCC)    Neuropathy    Non-ST elevation (NSTEMI) myocardial infarction (HCC)    08/29/20, 09/29/20   OSA (obstructive sleep apnea) 09/03/2020   Other specified hypothyroidism    PAF (paroxysmal atrial fibrillation) (HCC)    s/p pulmonary vein isolation by cryoablation 10/04/15 Dr. Meldon in [facility]   PEA (Pulseless electrical activity) [facility])    ~ 01/13/21 at [facility] during admission DKA, volume overload, possible CAP, hypoxia s/p ACLS with ROSC ~ 10-15   Pneumonia    Primary insomnia    Rheumatoid arthritis (HCC)    Stroke (HCC)    Thyroid  disease    Type 1 diabetes mellitus (HCC)    URI with cough and congestion 08/22/2023   Vitamin D  deficiency    Past Surgical History:  Procedure Laterality Date   ABDOMINAL HYSTERECTOMY     Partial- Still has both ovaries   AMPUTATION Left 12/18/2023   Procedure: AMPUTATION BELOW KNEE;  Surgeon: Harden Jerona GAILS, MD;  Location: [facility] OR;  Service: Orthopedics;  Laterality: Left;   ANKLE FUSION Left 11/13/2023   Procedure: HINDFOOT FUSION  FIXATION OF ANKLE;  Surgeon: Kendal Franky SQUIBB, MD;  Location: [facility] OR;  Service: Orthopedics;  Laterality: Left;   CARDIOVASCULAR STRESS TEST  08/13/2014   Nuclear; Normal   CARPAL TUNNEL RELEASE     CATARACT EXTRACTION, BILATERAL     CESAREAN SECTION     CHOLECYSTECTOMY     CORONARY ANGIOPLASTY WITH STENT PLACEMENT     3 blockages/ 1 stent   CORONARY BALLOON ANGIOPLASTY N/A 07/18/2023   Procedure: CORONARY BALLOON ANGIOPLASTY;  Surgeon: Verlin Lonni BIRCH, MD;  Location: [facility] INVASIVE CV LAB;  Service: Cardiovascular;  Laterality: N/A;   CORONARY PRESSURE/FFR STUDY N/A 09/07/2022   Procedure: INTRAVASCULAR PRESSURE WIRE/FFR STUDY;  Surgeon: Swaziland, Peter M, MD;  Location: [facility] INVASIVE CV LAB;  Service: Cardiovascular;  Laterality: N/A;  CORONARY STENT INTERVENTION N/A 09/29/2020   Procedure: CORONARY STENT INTERVENTION;  Surgeon: Wonda Sharper, MD;  Location: [facility] INVASIVE CV LAB;  Service: Cardiovascular;  Laterality: N/A;  CFX   CORONARY STENT INTERVENTION N/A 09/07/2022   Procedure: CORONARY STENT INTERVENTION;  Surgeon: Swaziland, Peter M, MD;  Location: [facility] INVASIVE CV LAB;  Service: Cardiovascular;  Laterality: N/A;   CORONARY STENT INTERVENTION N/A 02/13/2023   Procedure: CORONARY STENT INTERVENTION;  Surgeon: Verlin Lonni BIRCH, MD;  Location: [facility] INVASIVE CV LAB;  Service: Cardiovascular;  Laterality: N/A;  LAD   CORONARY ULTRASOUND/IVUS N/A 02/13/2023   Procedure: Coronary Ultrasound/IVUS;  Surgeon: Verlin Lonni BIRCH, MD;  Location: [facility] INVASIVE CV LAB;  Service: Cardiovascular;  Laterality: N/A;   CORONARY/GRAFT ACUTE MI REVASCULARIZATION N/A 12/08/2022   Procedure: Coronary/Graft Acute MI Revascularization;  Surgeon: Anner Alm ORN, MD;  Location: [facility] INVASIVE CV LAB;  Service: Cardiovascular;  Laterality: N/A;   EYE SURGERY     LEFT HEART CATH AND CORONARY ANGIOGRAPHY N/A 09/29/2020   Procedure: LEFT HEART CATH AND CORONARY ANGIOGRAPHY;  Surgeon: Wonda Sharper, MD;   Location: [facility] INVASIVE CV LAB;  Service: Cardiovascular;  Laterality: N/A;   LEFT HEART CATH AND CORONARY ANGIOGRAPHY N/A 09/07/2022   Procedure: LEFT HEART CATH AND CORONARY ANGIOGRAPHY;  Surgeon: Swaziland, Peter M, MD;  Location: [facility] INVASIVE CV LAB;  Service: Cardiovascular;  Laterality: N/A;   LEFT HEART CATH AND CORONARY ANGIOGRAPHY N/A 12/08/2022   Procedure: LEFT HEART CATH AND CORONARY ANGIOGRAPHY;  Surgeon: Anner Alm ORN, MD;  Location: [facility] INVASIVE CV LAB;  Service: Cardiovascular;  Laterality: N/A;   LEFT HEART CATH AND CORONARY ANGIOGRAPHY N/A 02/13/2023   Procedure: LEFT HEART CATH AND CORONARY ANGIOGRAPHY;  Surgeon: Verlin Lonni BIRCH, MD;  Location: [facility] INVASIVE CV LAB;  Service: Cardiovascular;  Laterality: N/A;   LEFT HEART CATH AND CORONARY ANGIOGRAPHY N/A 07/16/2023   Procedure: LEFT HEART CATH AND CORONARY ANGIOGRAPHY;  Surgeon: Verlin Lonni BIRCH, MD;  Location: [facility] INVASIVE CV LAB;  Service: Cardiovascular;  Laterality: N/A;   RIGHT HEART CATH N/A 04/27/2021   Procedure: RIGHT HEART CATH;  Surgeon: Cherrie Toribio SAUNDERS, MD;  Location: [facility] INVASIVE CV LAB;  Service: Cardiovascular;  Laterality: N/A;   RIGHT/LEFT HEART CATH AND CORONARY ANGIOGRAPHY N/A 01/07/2017   Procedure: Right/Left Heart Cath and Coronary Angiography;  Surgeon: Rolan Ezra RAMAN, MD;  Location: [facility] INVASIVE CV LAB;  Service: Cardiovascular;  Laterality: N/A;   ROTATOR CUFF REPAIR     TEE WITHOUT CARDIOVERSION N/A 04/28/2021   Procedure: TRANSESOPHAGEAL ECHOCARDIOGRAM (TEE);  Surgeon: Cherrie Toribio SAUNDERS, MD;  Location: [facility] ENDOSCOPY;  Service: Cardiovascular;  Laterality: N/A;   TEE WITHOUT CARDIOVERSION N/A 05/18/2021   Procedure: TRANSESOPHAGEAL ECHOCARDIOGRAM (TEE);  Surgeon: Thukkani, Arun K, MD;  Location: [facility] INVASIVE CV LAB;  Service: Cardiovascular;  Laterality: N/A;   TOE AMPUTATION Left    TRANSCATHETER MITRAL EDGE TO EDGE REPAIR N/A 05/18/2021   Procedure: MITRAL VALVE REPAIR;  Surgeon:  Wendel Lurena POUR, MD;  Location: [facility] INVASIVE CV LAB;  Service: Cardiovascular;  Laterality: N/A;   TRANSESOPHAGEAL ECHOCARDIOGRAM (CATH LAB) N/A 12/23/2023   Procedure: TRANSESOPHAGEAL ECHOCARDIOGRAM;  Surgeon: Kate Lonni CROME, MD;  Location: [facility] INVASIVE CV LAB;  Service: Cardiovascular;  Laterality: N/A;   US  ECHOCARDIOGRAPHY  05/2017   Normal    Family History  Problem Relation Age of Onset   Breast cancer Mother 58       contralateral breast ca dx 56   Coronary artery disease Mother  Hypertension Mother    Hyperlipidemia Mother    Diabetes type II Mother    Lung cancer Mother 32   Diabetes Father    Hypertension Father    Mental illness Sister    Bipolar disorder Other    Depression Other    Schizophrenia Other    Cancer Other        MGF's sisters x2; unknown type   Social History   Socioeconomic History   Marital status: Married    Spouse name: [name]   Number of children: 2   Years of education: 16   Highest education level: Master's degree (e.g., MA, MS, MEng, MEd, MSW, MBA)  Occupational History   Occupation: on disability/retired early former Engineer, site  Tobacco Use   Smoking status: Former    Current packs/day: 0.00    Types: Cigarettes    Start date: 69    Quit date: 1984    Years since quitting: 41.5    Passive exposure: Past   Smokeless tobacco: Never  Vaping Use   Vaping status: Never Used  Substance and Sexual Activity   Alcohol use: Never   Drug use: Never   Sexual activity: Not on file  Other Topics Concern   Not on file  Social History Narrative   Not on file   Social Drivers of Health   Financial Resource Strain: Low Risk  (10/24/2023)   Overall Financial Resource Strain (CARDIA)    Difficulty of Paying Living Expenses: Not hard at all  Food Insecurity: No Food Insecurity (12/14/2023)   Hunger Vital Sign    Worried About Running Out of Food in the Last Year: Never true    Ran Out of Food in the Last Year: Never true   Transportation Needs: No Transportation Needs (12/14/2023)   PRAPARE - Administrator, Civil Service (Medical): No    Lack of Transportation (Non-Medical): No  Physical Activity: Insufficiently Active (10/24/2023)   Exercise Vital Sign    Days of Exercise per Week: 3 days    Minutes of Exercise per Session: 30 min  Stress: Stress Concern Present (10/24/2023)   [employer] of Occupational Health - Occupational Stress Questionnaire    Feeling of Stress : To some extent  Social Connections: Moderately Integrated (12/14/2023)   Social Connection and Isolation Panel    Frequency of Communication with Friends and Family: More than three times a week    Frequency of Social Gatherings with Friends and Family: Three times a week    Attends Religious Services: More than 4 times per year    Active Member of Clubs or Organizations: No    Attends Banker Meetings: Never    Marital Status: Married    Objective:  There were no vitals taken for this visit.     01/05/2024    5:39 AM 01/04/2024    8:15 PM 01/04/2024    4:06 PM  BP/Weight  Systolic BP 138 115 140  Diastolic BP 47 48 55  Wt. (Lbs) 210.54    BMI 35.04 kg/m2      Physical Exam  {Perform Simple Foot Exam  Perform Detailed exam:1} {Insert foot Exam (Optional):30965}   Lab Results  Component Value Date   WBC 8.9 01/05/2024   HGB 7.5 (L) 01/05/2024   HCT 24.2 (L) 01/05/2024   PLT 272 01/05/2024   GLUCOSE 123 (H) 01/05/2024   CHOL 92 (L) 07/12/2023   TRIG 81 07/12/2023   HDL 47 07/12/2023   LDLCALC  28 07/12/2023   ALT 7 12/30/2023   AST 11 (L) 12/30/2023   NA 134 (L) 01/05/2024   K 4.8 01/05/2024   CL 99 01/05/2024   CREATININE 3.29 (H) 01/05/2024   BUN 66 (H) 01/05/2024   CO2 25 01/05/2024   TSH 2.900 01/19/2022   INR 1.1 05/16/2021   HGBA1C 10.2 (H) 11/12/2023      Assessment & Plan:  There are no diagnoses linked to this encounter.   No orders of the defined types were placed in  this encounter.   No orders of the defined types were placed in this encounter.    Follow-up: No follow-ups on file.   I,Alvita Fana I Leal-Borjas,acting as a scribe for Abigail Free, MD.,have documented all relevant documentation on the behalf of Abigail Free, MD,as directed by  Abigail Free, MD while in the presence of Abigail Free, MD.   An After Visit Summary was printed and given to the patient.  Abigail Free, MD [facility] [phone]

## 2024-01-05 NOTE — Progress Notes (Signed)
 Physical Therapy Session Note  Patient Details  Name: Dana [name] MRN: [id number] Date of Birth: [birth year]  Today's Date: 01/05/2024 PT Individual Time: 1113-1207 PT Individual Time Calculation (min): 54 min   Short Term Goals: Week 1:  PT Short Term Goal 1 (Week 1): Pt will complete bed mobility with distant supervision PT Short Term Goal 1 - Progress (Week 1): Met PT Short Term Goal 2 (Week 1): Pt will complete bed > chair transfer with CGA + LRAD PT Short Term Goal 2 - Progress (Week 1): Met PT Short Term Goal 3 (Week 1): Pt will complete sit to stand with modA + LRAD PT Short Term Goal 3 - Progress (Week 1): Met PT Short Term Goal 4 (Week 1): Pt will complete 39ft WC propulsion with minA PT Short Term Goal 4 - Progress (Week 1): Met  Skilled Therapeutic Interventions/Progress Updates:    Pt supine asleep in bed upon arrival. Pt awoken and agreeable. Pt requiring increased time to wake up.   Pt performed bed mobility with supervision/mod I. Pt donned/doffed shirt with set up assist. Pt required assist to donn R LE ted hose. Pt donned pants and shoes while seated EOB with ++ time   Pt performed slide board transfer bed to [facility] with CGA to stabilize board and WC, with pt requiring max questioning cues for sequencing for management of WC parts and slide board.   Pt performed slide baord transfer WC to BSC over toilet with min A, verbal cues provided for safety with emphasis on head hip ratio.   Pt continent of bowel and bladder. Pt required max A to donn pants. Pt performed slide board transfer BSC to WC with CGA.   Pt seated in [facility] with all needs within reach and seatbelt alarm on.   Therapy Documentation Precautions:  Precautions Precautions: Fall, Other (comment) Recall of Precautions/Restrictions: Intact Precaution/Restrictions Comments: LLE would vac, L limb protector Restrictions Weight Bearing Restrictions Per Provider Order: Yes LLE Weight Bearing Per Provider Order:  Non weight bearing   Therapy/Group: Individual Therapy  [facility] Doreene Orris, Curran, DPT  01/05/2024, 11:36 AM

## 2024-01-05 NOTE — Progress Notes (Signed)
 PROGRESS NOTE   Subjective/Complaints: No events overnight.  No acute complaints. BUN continues to uptrend, creatinine downtrending/stable, sodium slightly improved from yesterday. Urine osmolality slightly low, serum osmolality slightly high, not consistent with SIADH.  Appropriate per nephrology. right lower extremity duplex yesterday without DVT, although did note interstitial edema throughout the lower extremity.  Vitals stable     01/05/2024    5:39 AM 01/04/2024   11:15 PM 01/04/2024    8:15 PM  Vitals with BMI  Weight 210 lbs 9 oz    BMI 35.04    Systolic 138  115  Diastolic 47  48  Pulse 64 63 60    Recent Labs    01/04/24 1753 01/04/24 2119 01/05/24 0604  GLUCAP 125* 70 106*    ROS: Patient denies fever, chills, HA, SOB, CP, abdominal pain, N/V/D + Rash  + Tremor + dysuria  Objective:   VAS US  LOWER EXTREMITY VENOUS (DVT) Result Date: 01/05/2024  Lower Venous DVT Study Patient Name:  Dana [name]  Date of Exam:   01/04/2024 Medical Rec #: [id number]       Accession #:    [id number] Date of Birth: 1960/07/28       Patient Gender: F Patient Age:   63 years Exam Location:  [facility] Procedure:      VAS US  LOWER EXTREMITY VENOUS (DVT) Referring Phys: Dana [name] --------------------------------------------------------------------------------  Indications: Edema. Other Indications: Left BKA 12/18/2023, now in rehabilitation unit. Risk Factors: AKI on CKD IV. Limitations: Body habitus, poor ultrasound/tissue interface and clothing. Comparison Study: Prior negative bilateral LEV done 12/15/23 Performing Technologist: Alberta Lis RVS  Examination Guidelines: A complete evaluation includes B-mode imaging, spectral Doppler, color Doppler, and power Doppler as needed of all accessible portions of each vessel. Bilateral testing is considered an integral part of a complete examination. Limited examinations for  reoccurring indications may be performed as noted. The reflux portion of the exam is performed with the patient in reverse Trendelenburg.  +---------+---------------+---------+-----------+----------+-------------------+ RIGHT    CompressibilityPhasicitySpontaneityPropertiesThrombus Aging      +---------+---------------+---------+-----------+----------+-------------------+ CFV      Full           Yes      No                                       +---------+---------------+---------+-----------+----------+-------------------+ SFJ      Full                                                             +---------+---------------+---------+-----------+----------+-------------------+ FV Prox  Full                                                             +---------+---------------+---------+-----------+----------+-------------------+  FV Mid                                                Not well visualized +---------+---------------+---------+-----------+----------+-------------------+ FV Distal               Yes      No                   patent by color and                                                       Doppler             +---------+---------------+---------+-----------+----------+-------------------+ PFV      Full                                                             +---------+---------------+---------+-----------+----------+-------------------+ POP      Full           Yes      No                                       +---------+---------------+---------+-----------+----------+-------------------+ PTV      Full                                                             +---------+---------------+---------+-----------+----------+-------------------+ PERO     Full                                                             +---------+---------------+---------+-----------+----------+-------------------+   Left Technical Findings:  Left leg not evaluated.   Summary: RIGHT: - There is no evidence of deep vein thrombosis in the lower extremity. However, portions of this examination were limited- see technologist comments above.  - No cystic structure found in the popliteal fossa. Interstitial edema noted throughout the right lower extremity   *See table(s) above for measurements and observations. Electronically signed by Penne Colorado MD on 01/05/2024 at 10:32:04 AM.    Final     Recent Labs    01/05/24 0610  WBC 8.9  HGB 7.5*  HCT 24.2*  PLT 272   Recent Labs    01/04/24 0718 01/05/24 0610  NA 132* 134*  K 3.9 4.8  CL 99 99  CO2 25 25  GLUCOSE 103* 123*  BUN 64* 66*  CREATININE 3.36* 3.29*  CALCIUM  8.3* 8.4*    Intake/Output Summary (Last 24 hours) at 01/05/2024 1130 Last data filed at 01/05/2024 0700 Gross per 24 hour  Intake 236 ml  Output --  Net 236 ml         Physical Exam: Vital Signs Blood pressure (!) 138/47, pulse 64, temperature 97.8 F (36.6 C), resp. rate 18, height 5' 5 (1.651 m), weight 95.5 kg, SpO2 98%.  Constitutional: No distress . Vital signs reviewed.  Reclining in bed. HEENT: NCAT, EOMI, oral membranes moist Neck: supple Cardiovascular: RRR without murmur. No JVD    Respiratory/Chest: CTA Bilaterally without wheezes or rales. Normal effort    GI/Abdomen: BS +, sl tender, non-distended, bruising Psych: pleasant and cooperative  Skin: Diffuse bruising along bilateral upper extremity forearms  Incision well-approximated, no areas of active drainage or dehiscence.  Mild amount of edema, without erythema.  Healing very well.   Rash more disseminated along right lower extremity, and with larger nonblanchable nodules with some blanchable erythema in between.  No textural changes or raised lesions.  Nontender, no reduced or altered sensation, no itching.--Unchanged 6/24  Neuro:  Alert and oriented x 3. Normal insight and awareness. Intact Memory. Normal language and speech. Cranial  nerve exam unremarkable. MMT: BUE 4+ to 5/5. LLE limited by vac, pain, at least 3 to 3+/5. RLE 4 to 4+/5. Sensory exam normal for light touch and pain in all 4 limbs. No limb ataxia or cerebellar signs. No abnormal tone appreciated.  . Mild b/l UE intention tremor  Musculoskeletal: LBKA Shrinker in place--well-formed.   Assessment/Plan: 1. Functional deficits which require 3+ hours per day of interdisciplinary therapy in a comprehensive inpatient rehab setting. Physiatrist is providing close team supervision and 24 hour management of active medical problems listed below. Physiatrist and rehab team continue to assess barriers to discharge/monitor patient progress toward functional and medical goals  Care Tool:  Bathing    Body parts bathed by patient: Right arm, Left upper leg, Left arm, Right lower leg, Chest, Abdomen, Front perineal area, Face, Left lower leg, Buttocks, Right upper leg (sititng in chair/ EOB. sponge bath)   Body parts bathed by helper: Front perineal area, Buttocks, Right lower leg Body parts n/a: Left lower leg   Bathing assist Assist Level: Contact Guard/Touching assist     Upper Body Dressing/Undressing Upper body dressing   What is the patient wearing?: Pull over shirt    Upper body assist Assist Level: Set up assist    Lower Body Dressing/Undressing Lower body dressing      What is the patient wearing?: Pants, Orthosis, Ace wrap/stump shrinker     Lower body assist Assist for lower body dressing: Minimal Assistance - Patient > 75%     Toileting Toileting    Toileting assist Assist for toileting: Minimal Assistance - Patient > 75%     Transfers Chair/bed transfer  Transfers assist  Chair/bed transfer activity did not occur: Safety/medical concerns  Chair/bed transfer assist level: Contact Guard/Touching assist     Locomotion Ambulation   Ambulation assist   Ambulation activity did not occur: Safety/medical concerns (2/2 endurance  deficit and WB restrictions)          Walk 10 feet activity   Assist  Walk 10 feet activity did not occur: Safety/medical concerns (2/2 endurance deficit and WB restrictions)        Walk 50 feet activity   Assist Walk 50 feet with 2 turns activity did not occur: Safety/medical concerns (2/2 endurance deficit and WB restrictions)         Walk 150 feet activity   Assist Walk 150 feet activity did not occur: Safety/medical  concerns (2/2 endurance deficit and WB restrictions)         Walk 10 feet on uneven surface  activity   Assist Walk 10 feet on uneven surfaces activity did not occur: Safety/medical concerns (2/2 endurance deficit and WB restrictions)         Wheelchair     Assist Is the patient using a wheelchair?: Yes Type of Wheelchair: Manual    Wheelchair assist level: Supervision/Verbal cueing Max wheelchair distance: 100    Wheelchair 50 feet with 2 turns activity    Assist        Assist Level: Supervision/Verbal cueing   Wheelchair 150 feet activity     Assist      Assist Level: Moderate Assistance - Patient 50 - 74%   Blood pressure (!) 138/47, pulse 64, temperature 97.8 F (36.6 C), resp. rate 18, height 5' 5 (1.651 m), weight 95.5 kg, SpO2 98%.  Medical Problem List and Plan: 1. 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                -patient may shower if leg is covered, vac pump away from water             -ELOS/Goals: 7-10 days, mod I w/c level, supervision ambulatory level  -Continue CIR therapies including PT, OT   - Expected discharge 6/25  2.  Antithrombotics: -DVT/anticoagulation:  Pharmaceutical: *dc'ed sq heparin  d/t bleeding from sq inj sites  -SCD RLE--DC'd due to bruising/rash, appears stable             -antiplatelet therapy: Aspirin  81 mg daily, Brilinta  90 mg twice daily- - 6/21-22: ongoing hematuria and uptrending BUN to creatinine ratio  concerning for bleeding on DAPT.  Hemoglobin has remained stable--defer adjustments to primary given high risk   6/21: Right lower extremity duplex negative  3. Pain Management: Neurontin  300 mg 3 times daily, oxycodone  as needed             -pt reports her worst pain is from her low back and related left lumbar radiculopathy. No phantom pain currently                         -continue above meds for now. Encouraged oob                           -therapy can work on ROM, posture, etc.  4. Mood/Behavior/Sleep/hallucinations: Lexapro  10 mg daily, BuSpar  15 mg 3 times daily             -mood is positive             -antipsychotic agents: N/A  6-21: DC Lexapro  due to hyponatremia. Add melatonin for sleep 5 mg PRN.  Patient does not want to have your sleep medications due to prior poor experiences.  No evidence of active hallucinations on exam.  6/22: No endorsed hallucinations, delusions at this time.  5. Neuropsych/cognition: This patient is capable of making decisions on her own behalf. 6.  Wound Care/ID:  daptomycin  through 12/28/23 for MRSA bacteremia             -MRSA contact precautions             -pt was changed to traditional vac dressing  6/12 as prevena was not holding suction.  6/15-Still no drainage in cannister. Dressing is due for a change Monday.   **Consider  removing Vac Monday if no further drainage in cannister.   -Dr. Harden note indicated plan for 1 week wound vac after DC  -6/19 DC wound VAC today  6/22: Wound looks excellent; continue shrinker sock and open to air   7. Fluids/Electrolytes/Nutrition: Routine in and outs with follow-up chemistries 8.  Acute on chronic anemia.   Hgb with drop today to 7.3. minimal sx             -transfused 1u PRBC 6/13             -recheck hgb up to 8.1             -continue Brilinta  for now  -holding heparin  as above  -fe++ supp  -6/16 HGB 7.4, continue to monitor  -6/19  HGB stable 7.6, continue to monitor   6-22: Hemoglobin stable  7.5, see #2 above   9.  Diabetes mellitus with peripheral neuropathy.  Hemoglobin A1c 10.2.  NovoLog  3 units 3 times daily, Semglee  5 units daily.  Check blood sugars AC and at bedtime  - Blood sugars well-controlled Recent Labs    01/05/24 1259 01/05/24 1727 01/05/24 2147  GLUCAP 109* 150* 178*     10.  CAD/stenting/angioplasty as well as history of mitral valve replacement.  Continue aspirin  and Brilinta .  Follow-up cardiology services 11.  Diastolic congestive heart failure.  Coreg  9.375 mg twice daily, Demadex  60 mg daily (hold).  Monitor for any signs of fluid overload             -need weights-stable  - 6/17 Coreg  held by nephrology  -6/18-20 stable continue to monitor NO signs of overload  01-03-21: Stable weights, stable appearance.  Nephrology discontinued midodrine .   Filed Weights   01/02/24 0634 01/04/24 0513 01/05/24 0539  Weight: 95.5 kg 95.1 kg 95.5 kg   t 12.  GERD.  Protonix  13.  Hyperlipidemia.  Zetia  14.  Chronic hypoxic respiratory failure/OSA.  Continue CPAP  15.  CKD stage IV.  Baseline 2.2-2.4.               6/14 -Cr bumped  to 2.7 and sl to 2.81 today              -may be related to anemia              -continue to hold demadex  6/15- Cr continues to trend up-- 3.22  -still think this is due to demadex  she has received up until Friday  - push fluids and recheck BMET in am  -if further increase tomorrow will need to contact nephrology 6/16 Nephrology consulted Cr a little higher today 6/17 nephrologist started midodrine  Coreg  held -Foley removed for UA and culture to be completed 6/19 discussed with nephrology regarding pyuria, since Foley just removed and no symptoms continue to monitor for now and will plan to repeat UA in several days. 6/20 BP has been stable, appears midodrine  has been discontinued, creatinine very slowly improving now at 3.54 6-21: Creatinine downtrending, 3.36 today.  BUN stable 64.  Worsening hyponatremia at 132; labs to  evaluate 6-22: Appreciate nephrology recommendations; recommending IgA considering fair amount of hematuria/proteinuria, however likely etiology diabetic kidney disease plus Brilinta  and ?UTI.      01/05/2024    5:39 AM 01/04/2024   11:15 PM 01/04/2024    8:15 PM  Vitals with BMI  Weight 210 lbs 9 oz    BMI 35.04    Systolic 138  115  Diastolic 47  48  Pulse 64 63 60    16.  History of gout.  Allopurinol .  Monitor for any gout flares 17. R leg rash  - Hydrocortisone  cream started.  - Advised her not to use the compression stocking device she was using previously.  Neurology considering IgA -6/20 improving, continue current regimen 6-22: diffuse/bruising pattern, duplex negative for DVT but noticed a lot of interstitial edema.  Rash is nontender, not itchy, and localized.  Agree likely localized trauma due to SCD and DAPT.  No changes noted over this weekend.  18. Urinary retention  -Foley removed, monitor voiding trial - 6/18 will message nursing to ensure regular PVRs/Bladder scans, will also ask about U/A- dont see done yet  6/19 patient did not have elevated bladder scans yesterday, has been continent  6/20 PVR 0 this AM, continent  6/21: No PVRs.  Continent of urine.  19. Occasional tremor  -Renal related? Continue to monitor .   6/19 discussed with nephrology not felt to be uremic related.  Possibly due to discontinuation of beta-blocker  6/20 hopefully will improve when beta-blocker reinitiated  20. Dysuria, likely UTI  -Will send urine sample for cx and plan to start abx after this  -Keflex  started 250mg  Q24h, monitor culture- Addendum- DC renal started cipro   6/21: Urinalysis with large blood, rare bacteria, no leukocyte esterase or nitrates.  DC Cipro . Patient Cr too high for analgesic.   6/22: Cipro  resumed by nephrology, urine cultures with less than 10K.  Will continue now as is nephrology's preference but defer to primary team ongoing necessity--did discuss this with  patient.  21.  Hyponatremia.  Downtrending since admission, at 132 today.  - DC Lexapro   - Placed 1200 cc fluid restriction  - Urine studies ordered for today to evaluate; not on any diuretics  6/22: Slightly improved today, 134.  Labs appear borderline normal, slightly hypertonic, liberalize fluids and encourage p.o.'s today.  LOS: 9 days A FACE TO FACE EVALUATION WAS PERFORMED  Dana [name] 01/05/2024, 11:30 AM

## 2024-01-05 NOTE — Progress Notes (Incomplete)
 Physical Therapy Weekly Progress Note  Patient Details  Name: Dana [name] MRN: [id number] Date of Birth: Aug 21, 1960  Beginning of progress report period: December 28, 2023 End of progress report period: January 05, 2024  {CHL IP REHAB PT TIME CALCULATION:304800500}  Patient has met 4 of 4 short term goals.  Pt is performing bed mobility with use of bed features and supervision/mod I. Slide board transfer ***, sit to stand with RW and ***, D/C gait goal 2/2 to safety concerns. Downgraded bed mobility, *** goals 2/2 ***   Patient continues to demonstrate the following deficits {impairments:3041632} and therefore will continue to benefit from skilled PT intervention to increase functional independence with mobility.  Patient progressing toward long term goals..  Continue plan of care. See goal revision: 6/22.   PT Short Term Goals Week 1:  PT Short Term Goal 1 (Week 1): Pt will complete bed mobility with distant supervision PT Short Term Goal 1 - Progress (Week 1): Met PT Short Term Goal 2 (Week 1): Pt will complete bed > chair transfer with CGA + LRAD PT Short Term Goal 2 - Progress (Week 1): Met PT Short Term Goal 3 (Week 1): Pt will complete sit to stand with modA + LRAD PT Short Term Goal 3 - Progress (Week 1): Met PT Short Term Goal 4 (Week 1): Pt will complete 62ft WC propulsion with minA PT Short Term Goal 4 - Progress (Week 1): Met Week 2:  PT Short Term Goal 1 (Week 2): STG=LTG 2/2 ELOS  Skilled Therapeutic Interventions/Progress Updates:       Therapy Documentation Precautions:  Precautions Precautions: Fall, Other (comment) Recall of Precautions/Restrictions: Intact Precaution/Restrictions Comments: LLE would vac, L limb protector Restrictions Weight Bearing Restrictions Per Provider Order: Yes LLE Weight Bearing Per Provider Order: Non weight bearing Therapy/Group: Individual Therapy  [facility], [place], DPT  01/05/2024, 11:21 AM

## 2024-01-05 NOTE — Progress Notes (Signed)
 Nephrology Follow-Up Consult note   Assessment/Recommendations: Dana [name] is a/an 63 y.o. female with a past medical history significant for DM1, CAD, GERD, CHF, COPD, history of cardiac arrest who present w/ gangrenous foot s/p amputation c/b AKI    Nonoliguric AKI on CKD 4: Baseline creatinine 2.2-2.7.  Creatinine got up to 3.7.  Possibly was from low blood pressures - Workup overall benign.  Creatinine starting to improve-  also with clinical evidence of UTI-  started cipro  -Blood pressure now higher.  Stopped midodrine    - diabetic kidney disease seems the most likely plus brilinta  and UTI.   -Continue to monitor for low blood pressures -Encourage p.o. intake  -Monitor Daily I/Os, Daily weight  -Maintain MAP>65 for optimal renal perfusion. -   crt a little stalled today    CHF: Volume status appears stable.  No concerns. Holding coreg  for now   Uncontrolled DM1 with hyperglycemia: Management per primary team   Left BKA: Continue with rehab per primary team   Anemia: Likely multifactorial.  Iron  somewhat low but has allergy . Consider transfusion if needed-  gave dose of ESA as well  UTI-  symptomatic-  will start cipro  while culture is cooking -   would be my preference to continue cipro  given that she was symptomatic and had pyuria   Hyponartremia-  very mild-  urine osm appropriate-   no action needed      Curtis DELENA Heman [facility] 01/05/2024 11:48 AM  ___________________________________________________________  CC: Left leg amputation  Interval History/Subjective:  feels not quite as good today -  working with PT-   some jittery    Medications:  Current Facility-Administered Medications  Medication Dose Route Frequency Provider Last Rate Last Admin   acetaminophen  (TYLENOL ) tablet 650 mg  650 mg Oral Q6H PRN Angiulli, Daniel J, PA-C   650 mg at 01/04/24 1321   Or   acetaminophen  (TYLENOL ) suppository 650 mg  650 mg Rectal Q6H PRN  Angiulli, Daniel J, PA-C       allopurinol  (ZYLOPRIM ) tablet 50 mg  50 mg Oral Daily Angiulli, Daniel J, PA-C   50 mg at 01/05/24 0813   aspirin  EC tablet 81 mg  81 mg Oral Daily Angiulli, Daniel J, PA-C   81 mg at 01/05/24 0813   busPIRone  (BUSPAR ) tablet 15 mg  15 mg Oral TID Angiulli, Daniel J, PA-C   15 mg at 01/05/24 0813   Chlorhexidine  Gluconate Cloth 2 % PADS 6 each  6 each Topical Q12H Urbano Albright, MD   6 each at 01/01/24 0500   ciprofloxacin  (CIPRO ) tablet 500 mg  500 mg Oral Q breakfast Keymora Grillot, MD   500 mg at 01/05/24 0813   Darbepoetin Alfa  (ARANESP ) injection 200 mcg  200 mcg Subcutaneous Q Sat-1800 Nayleah Gamel, MD   200 mcg at 01/04/24 1826   ezetimibe  (ZETIA ) tablet 10 mg  10 mg Oral Daily Angiulli, Daniel J, PA-C   10 mg at 01/05/24 0813   gabapentin  (NEURONTIN ) capsule 300 mg  300 mg Oral TID Angiulli, Daniel J, PA-C   300 mg at 01/05/24 9186   hydrocortisone  cream 1 %   Topical TID Urbano Albright, MD   Given at 01/05/24 0816   insulin  aspart (novoLOG ) injection 0-6 Units  0-6 Units Subcutaneous TID AC & HS Pegge Toribio PARAS, PA-C   1 Units at 01/04/24 9374   insulin  aspart (novoLOG ) injection 3 Units  3 Units Subcutaneous TID WC Angiulli, Daniel J, PA-C  3 Units at 01/05/24 0816   insulin  glargine-yfgn (SEMGLEE ) injection 5 Units  5 Units Subcutaneous Daily Angiulli, Daniel J, PA-C   5 Units at 01/05/24 9182   latanoprost  (XALATAN ) 0.005 % ophthalmic solution 1 drop  1 drop Both Eyes QHS AngiulliToribio PARAS, PA-C   1 drop at 01/04/24 2054   LORazepam  (ATIVAN ) tablet 0.5 mg  0.5 mg Oral QID PRN Angiulli, Daniel J, PA-C   0.5 mg at 01/03/24 1359   melatonin tablet 5 mg  5 mg Oral QHS PRN Engler, Morgan C, DO       oxyCODONE  (Oxy IR/ROXICODONE ) immediate release tablet 7.5 mg  7.5 mg Oral Q4H PRN Pegge Toribio PARAS, PA-C   7.5 mg at 01/05/24 0555   pantoprazole  (PROTONIX ) EC tablet 40 mg  40 mg Oral Daily Angiulli, Daniel J, PA-C   40 mg at 01/05/24  0813   polyethylene glycol (MIRALAX  / GLYCOLAX ) packet 17 g  17 g Oral Daily Pegge Toribio PARAS, PA-C   17 g at 01/03/24 0855   senna (SENOKOT) tablet 17.2 mg  2 tablet Oral QHS Pegge Toribio PARAS, PA-C   17.2 mg at 01/01/24 2022   ticagrelor  (BRILINTA ) tablet 90 mg  90 mg Oral BID Pegge Toribio PARAS, PA-C   90 mg at 01/05/24 0813      Review of Systems: 10 systems reviewed and negative except per interval history/subjective  Physical Exam: Vitals:   01/04/24 2315 01/05/24 0539  BP:  (!) 138/47  Pulse: 63 64  Resp: 18 18  Temp:  97.8 F (36.6 C)  SpO2: 95% 98%   No intake/output data recorded.  Intake/Output Summary (Last 24 hours) at 01/05/2024 1148 Last data filed at 01/05/2024 0700 Gross per 24 hour  Intake 236 ml  Output --  Net 236 ml   Constitutional: well-appearing, no acute distress ENMT: ears and nose without scars or lesions, MMM CV: normal rate, 1+ nonpitting edema in the bilateral lower extremities Respiratory: Bilateral chest rise, normal work of breathing Gastrointestinal: soft, non-tender, no palpable masses or hernias Skin: Palpable and nonpalpable purpura predominantly on the right shin and ankle, otherwise no visible lesions or rashes Psych: alert, judgement/insight appropriate, appropriate mood and affect Neuro: Resting tremor in the bilateral hands.  No myoclonic jerking.  No asterixis   Test Results I personally reviewed new and old clinical labs and radiology tests Lab Results  Component Value Date   NA 134 (L) 01/05/2024   K 4.8 01/05/2024   CL 99 01/05/2024   CO2 25 01/05/2024   BUN 66 (H) 01/05/2024   CREATININE 3.29 (H) 01/05/2024   GLU 246 03/29/2022   CALCIUM  8.4 (L) 01/05/2024   ALBUMIN  2.2 (L) 01/04/2024   PHOS 4.9 (H) 01/04/2024    CBC Recent Labs  Lab 12/30/23 0503 01/02/24 0510 01/05/24 0610  WBC 10.5 11.4* 8.9  NEUTROABS 8.3*  --  6.4  HGB 7.4* 7.6* 7.5*  HCT 24.1* 24.2* 24.2*  MCV 88.9 89.6 89.3  PLT 292 316 272

## 2024-01-06 ENCOUNTER — Ambulatory Visit: Admitting: Family Medicine

## 2024-01-06 LAB — BASIC METABOLIC PANEL WITH GFR
Anion gap: 6 (ref 5–15)
BUN: 64 mg/dL — ABNORMAL HIGH (ref 6–20)
CO2: 24 mmol/L (ref 22–32)
Calcium: 8 mg/dL — ABNORMAL LOW (ref 8.9–10.3)
Chloride: 101 mmol/L (ref 98–111)
Creatinine, Ser: 3.18 mg/dL — ABNORMAL HIGH (ref 0.44–1.00)
GFR, Estimated: 16 mL/min — ABNORMAL LOW (ref >60)
Glucose, Bld: 125 mg/dL — ABNORMAL HIGH (ref 70–99)
Potassium: 4.6 mmol/L (ref 3.5–5.1)
Sodium: 131 mmol/L — ABNORMAL LOW (ref 135–145)

## 2024-01-06 LAB — URINE CULTURE

## 2024-01-06 LAB — CBC
HCT: 23.2 % — ABNORMAL LOW (ref 36.0–46.0)
Hemoglobin: 7.1 g/dL — ABNORMAL LOW (ref 12.0–15.0)
MCH: 27.5 pg (ref 26.0–34.0)
MCHC: 30.6 g/dL (ref 30.0–36.0)
MCV: 89.9 fL (ref 80.0–100.0)
Platelets: 246 10*3/uL (ref 150–400)
RBC: 2.58 MIL/uL — ABNORMAL LOW (ref 3.87–5.11)
RDW: 17.5 % — ABNORMAL HIGH (ref 11.5–15.5)
WBC: 8.5 10*3/uL (ref 4.0–10.5)
nRBC: 0 % (ref 0.0–0.2)

## 2024-01-06 LAB — GLUCOSE, CAPILLARY
Glucose-Capillary: 138 mg/dL — ABNORMAL HIGH (ref 70–99)
Glucose-Capillary: 139 mg/dL — ABNORMAL HIGH (ref 70–99)
Glucose-Capillary: 151 mg/dL — ABNORMAL HIGH (ref 70–99)
Glucose-Capillary: 163 mg/dL — ABNORMAL HIGH (ref 70–99)

## 2024-01-06 NOTE — Progress Notes (Signed)
 Occupational Therapy Session Note  Patient Details  Name: Dana [name] MRN: [id number] Date of Birth: May 25, 1961  {CHL IP REHAB OT TIME CALCULATIONS:[id number]}   Short Term Goals: Week 2:  OT Short Term Goal 1 (Week 2): patient will increase ability to complete LB dressing CGA utilizing lateral leans on EOB OT Short Term Goal 2 (Week 2): patient will complete toilet transfers to [facility] or toilet utilizing slide board with CGA  Skilled Therapeutic Interventions/Progress Updates:    Patient agreeable to participate in OT session. Reports *** pain level.   Patient participated in skilled OT session focusing on ***. Therapist facilitated/assessed/developed/educated/integrated/elicited *** in order to improve/facilitate/promote    Therapy Documentation Precautions:  Precautions Precautions: Fall, Other (comment) Recall of Precautions/Restrictions: Intact Precaution/Restrictions Comments: LLE would vac, L limb protector Restrictions Weight Bearing Restrictions Per Provider Order: Yes LLE Weight Bearing Per Provider Order: Non weight bearing  Therapy/Group: Individual Therapy  Leita Howell, OTR/L,CBIS  Supplemental OT - [facility] and [facility] Secure Chat Preferred   01/06/2024, 6:11 PM

## 2024-01-06 NOTE — Progress Notes (Signed)
 Patient ID: Dana [name], female   DOB: Sep 04, 1960, 64 y.o.   MRN: [id number] S: No new complaints. O:BP (!) 135/37 (BP Location: Right Arm)   Pulse 63   Temp 98.2 F (36.8 C) (Oral)   Resp 14   Ht 5' 5 (1.651 m)   Wt 102.6 kg   SpO2 96%   BMI 37.64 kg/m   Intake/Output Summary (Last 24 hours) at 01/06/2024 1404 Last data filed at 01/06/2024 1300 Gross per 24 hour  Intake 900 ml  Output --  Net 900 ml   Intake/Output: I/O last 3 completed shifts: In: 236 [P.O.:236] Out: -   Intake/Output this shift:  Total I/O In: 900 [P.O.:900] Out: -  Weight change: 7.1 kg Gen: NAD CVS: RRR Resp:CTA Abd: +BS, soft, NT/nD Ext: trace RLE edema, s/p LBKA  Recent Labs  Lab 12/31/23 0433 01/01/24 0504 01/02/24 0510 01/02/24 0511 01/03/24 0622 01/04/24 0718 01/05/24 0610 01/06/24 0513  NA 133* 134* 134* 133* 134* 132* 134* 131*  K 4.6 4.5 4.8 4.8 4.6 3.9 4.8 4.6  CL 99 98 99 99 96* 99 99 101  CO2 25 26 22 22  21* 25 25 24   GLUCOSE 119* 151* 129* 129* 109* 103* 123* 125*  BUN 60* 61* 64* 64* 64* 64* 66* 64*  CREATININE 3.62* 3.71* 3.61* 3.57* 3.54* 3.36* 3.29* 3.18*  ALBUMIN  2.2* 2.2*  --  2.2* 2.3* 2.2*  --   --   CALCIUM  8.3* 8.6* 8.7* 8.6* 8.6* 8.3* 8.4* 8.0*  PHOS 4.8* 5.0*  --  5.4* 4.9* 4.9*  --   --    Liver Function Tests: Recent Labs  Lab 01/02/24 0511 01/03/24 0622 01/04/24 0718  ALBUMIN  2.2* 2.3* 2.2*   No results for input(s): LIPASE, AMYLASE in the last 168 hours. No results for input(s): AMMONIA in the last 168 hours. CBC: Recent Labs  Lab 01/02/24 0510 01/05/24 0610 01/06/24 0513  WBC 11.4* 8.9 8.5  NEUTROABS  --  6.4  --   HGB 7.6* 7.5* 7.1*  HCT 24.2* 24.2* 23.2*  MCV 89.6 89.3 89.9  PLT 316 272 246   Cardiac Enzymes: No results for input(s): CKTOTAL, CKMB, CKMBINDEX, TROPONINI in the last 168 hours. CBG: Recent Labs  Lab 01/05/24 1259 01/05/24 1727 01/05/24 2147 01/06/24 0536 01/06/24 1212  GLUCAP 109* 150* 178* 138*  139*    Iron  Studies: No results for input(s): IRON , TIBC, TRANSFERRIN, FERRITIN in the last 72 hours. Studies/Results: VAS US  LOWER EXTREMITY VENOUS (DVT) Result Date: 01/05/2024  Lower Venous DVT Study Patient Name:  Dana [name]  Date of Exam:   01/04/2024 Medical Rec #: [id number]       Accession #:    [id number] Date of Birth: 1961-01-15       Patient Gender: F Patient Age:   76 years Exam Location:  [facility] Procedure:      VAS US  LOWER EXTREMITY VENOUS (DVT) Referring Phys: JOESPH LIKES --------------------------------------------------------------------------------  Indications: Edema. Other Indications: Left BKA 12/18/2023, now in rehabilitation unit. Risk Factors: AKI on CKD IV. Limitations: Body habitus, poor ultrasound/tissue interface and clothing. Comparison Study: Prior negative bilateral LEV done 12/15/23 Performing Technologist: Dana [name] RVS  Examination Guidelines: A complete evaluation includes B-mode imaging, spectral Doppler, color Doppler, and power Doppler as needed of all accessible portions of each vessel. Bilateral testing is considered an integral part of a complete examination. Limited examinations for reoccurring indications may be performed as noted. The reflux portion of the exam is performed with  the patient in reverse Trendelenburg.  +---------+---------------+---------+-----------+----------+-------------------+ RIGHT    CompressibilityPhasicitySpontaneityPropertiesThrombus Aging      +---------+---------------+---------+-----------+----------+-------------------+ CFV      Full           Yes      No                                       +---------+---------------+---------+-----------+----------+-------------------+ SFJ      Full                                                             +---------+---------------+---------+-----------+----------+-------------------+ FV Prox  Full                                                              +---------+---------------+---------+-----------+----------+-------------------+ FV Mid                                                Not well visualized +---------+---------------+---------+-----------+----------+-------------------+ FV Distal               Yes      No                   patent by color and                                                       Doppler             +---------+---------------+---------+-----------+----------+-------------------+ PFV      Full                                                             +---------+---------------+---------+-----------+----------+-------------------+ POP      Full           Yes      No                                       +---------+---------------+---------+-----------+----------+-------------------+ PTV      Full                                                             +---------+---------------+---------+-----------+----------+-------------------+ PERO     Full                                                             +---------+---------------+---------+-----------+----------+-------------------+  Left Technical Findings: Left leg not evaluated.   Summary: RIGHT: - There is no evidence of deep vein thrombosis in the lower extremity. However, portions of this examination were limited- see technologist comments above.  - No cystic structure found in the popliteal fossa. Interstitial edema noted throughout the right lower extremity   *See table(s) above for measurements and observations. Electronically signed by Penne Colorado MD on 01/05/2024 at 10:32:04 AM.    Final     allopurinol   50 mg Oral Daily   aspirin  EC  81 mg Oral Daily   busPIRone   15 mg Oral TID   Chlorhexidine  Gluconate Cloth  6 each Topical Q12H   ciprofloxacin   500 mg Oral Q breakfast   darbepoetin (ARANESP ) injection - NON-DIALYSIS  200 mcg Subcutaneous Q Sat-1800   ezetimibe   10 mg Oral Daily   gabapentin   300  mg Oral TID   hydrocortisone  cream   Topical TID   insulin  aspart  0-6 Units Subcutaneous TID AC & HS   insulin  aspart  3 Units Subcutaneous TID WC   insulin  glargine-yfgn  5 Units Subcutaneous Daily   latanoprost   1 drop Both Eyes QHS   pantoprazole   40 mg Oral Daily   polyethylene glycol  17 g Oral Daily   senna  2 tablet Oral QHS   ticagrelor   90 mg Oral BID    BMET    Component Value Date/Time   NA 131 (L) 01/06/2024 0513   NA 143 07/29/2023 1138   K 4.6 01/06/2024 0513   CL 101 01/06/2024 0513   CO2 24 01/06/2024 0513   GLUCOSE 125 (H) 01/06/2024 0513   BUN 64 (H) 01/06/2024 0513   BUN 57 (H) 07/29/2023 1138   CREATININE 3.18 (H) 01/06/2024 0513   CREATININE 1.88 (H) 07/19/2022 1005   CALCIUM  8.0 (L) 01/06/2024 0513   CALCIUM  9.3 10/04/2023 1414   GFRNONAA 16 (L) 01/06/2024 0513   GFRNONAA 30 (L) 07/19/2022 1005   GFRAA 43 (L) 06/28/2020 1003   CBC    Component Value Date/Time   WBC 8.5 01/06/2024 0513   RBC 2.58 (L) 01/06/2024 0513   HGB 7.1 (L) 01/06/2024 0513   HGB 10.0 (L) 07/29/2023 1138   HCT 23.2 (L) 01/06/2024 0513   HCT 32.6 (L) 07/29/2023 1138   PLT 246 01/06/2024 0513   PLT 309 07/29/2023 1138   MCV 89.9 01/06/2024 0513   MCV 89 07/29/2023 1138   MCV 84 04/26/2022 0000   MCH 27.5 01/06/2024 0513   MCHC 30.6 01/06/2024 0513   RDW 17.5 (H) 01/06/2024 0513   RDW 14.5 07/29/2023 1138   LYMPHSABS 1.3 01/05/2024 0610   LYMPHSABS 1.8 07/12/2023 1105   MONOABS 0.7 01/05/2024 0610   EOSABS 0.4 01/05/2024 0610   EOSABS 0.3 07/12/2023 1105   BASOSABS 0.1 01/05/2024 0610   BASOSABS 0.1 07/12/2023 1105    Assessment/Recommendations: SAMAIYAH HOWES is a/an 63 y.o. female with a past medical history significant for DM1, CAD, GERD, CHF, COPD, history of cardiac arrest who present w/ gangrenous foot s/p amputation c/b AKI    Nonoliguric AKI on CKD 4: Baseline creatinine 2.2-2.7.  Creatinine got up to 3.7 but slolwy improving to 3.18 today.  Possibly was from  low blood pressures - Workup overall benign.  Creatinine starting to improve-  also with clinical evidence of UTI-  started cipro  -Blood pressure now higher.  Stopped midodrine     - diabetic kidney disease seems the most likely plus brilinta  and UTI.   -  Continue to monitor for low blood pressures -Encourage p.o. intake   -Monitor Daily I/Os, Daily weight  -Maintain MAP>65 for optimal renal perfusion. -   crt a little stalled today    CHF: Volume status appears stable.  No concerns. Holding coreg  for now   Uncontrolled DM1 with hyperglycemia: Management per primary team   Left BKA: Continue with rehab per primary team   Anemia: Likely multifactorial.  Iron  somewhat low but has allergy . Consider transfusion if needed-  gave dose of ESA as well   UTI-  symptomatic-  will start cipro  while culture is cooking -   would be my preference to continue cipro  given that she was symptomatic and had pyuria    Hyponartremia-  very mild-  urine osm appropriate-   no action needed   [name], MD [employer] [phone]

## 2024-01-06 NOTE — Plan of Care (Signed)
  Problem: RH Balance Goal: LTG Patient will maintain dynamic standing balance (PT) Description: LTG:  Patient will maintain dynamic standing balance with assistance during mobility activities (PT) 01/06/2024 1238 by Consuela Lob, PT Outcome: Not Applicable Flowsheets (Taken 01/06/2024 1238) LTG: Pt will maintain dynamic standing balance during mobility activities with:: (discontinued 2/2 anticipating pt to be at [facility] level upon discharge) -- 01/06/2024 1220 by Consuela Lob, PT Outcome: Not Applicable 01/06/2024 1213 by Consuela Lob, PT Flowsheets (Taken 01/06/2024 1213) LTG: Pt will maintain dynamic standing balance during mobility activities with:: (downgraded for pt overall safety 2/2 slow progress) Minimal Assistance - Patient > 75%   Problem: RH Ambulation Goal: LTG Patient will ambulate in controlled environment (PT) Description: LTG: Patient will ambulate in a controlled environment, # of feet with assistance (PT). Outcome: Not Applicable Flowsheets (Taken 01/06/2024 1213) LTG: Pt will ambulate in controlled environ  assist needed:: (Discontinued pt ambulation as pt unsafe to ambulate at this time) --

## 2024-01-06 NOTE — Plan of Care (Signed)
 Discontinued ambualtion goal 6/23 as pt unsafe to ambulate at this time. Downgraded dynamic standing goals to min A, and bed to chair transfer with supervision for pt overall safety 2/2 pt progressing slower than anticipated.  Problem: RH Balance Goal: LTG Patient will maintain dynamic standing balance (PT) Description: LTG:  Patient will maintain dynamic standing balance with assistance during mobility activities (PT) Flowsheets (Taken 01/06/2024 1213) LTG: Pt will maintain dynamic standing balance during mobility activities with:: (downgraded for pt overall safety 2/2 slow progress) Minimal Assistance - Patient > 75%   Problem: Sit to Stand Goal: LTG:  Patient will perform sit to stand with assistance level (PT) Description: LTG:  Patient will perform sit to stand with assistance level (PT) Flowsheets (Taken 01/06/2024 1213) LTG: PT will perform sit to stand in preparation for functional mobility with assistance level: (downgraded for pt overall safety 2/2 slow progress) Minimal Assistance - Patient > 75%   Problem: RH Bed Mobility Goal: LTG Patient will perform bed mobility with assist (PT) Description: LTG: Patient will perform bed mobility with assistance, with/without cues (PT). Flowsheets (Taken 01/06/2024 1213) LTG: Pt will perform bed mobility with assistance level of: (downgraded for pt overall safety 2/2 slow progress) Independent with assistive device    Problem: RH Bed to Chair Transfers Goal: LTG Patient will perform bed/chair transfers w/assist (PT) Description: LTG: Patient will perform bed to chair transfers with assistance (PT). Flowsheets (Taken 01/06/2024 1213) LTG: Pt will perform Bed to Chair Transfers with assistance level: (downgraded for pt overall safety 2/2 slow progress) Supervision/Verbal cueing   Problem: RH Ambulation Goal: LTG Patient will ambulate in controlled environment (PT) Description: LTG: Patient will ambulate in a controlled environment, # of feet with  assistance (PT). Outcome: Not Applicable Flowsheets (Taken 01/06/2024 1213) LTG: Pt will ambulate in controlled environ  assist needed:: (Discontinued pt ambulation as pt unsafe to ambulate at this time) --   Problem: RH Balance Goal: LTG Patient will maintain dynamic standing balance (PT) Description: LTG:  Patient will maintain dynamic standing balance with assistance during mobility activities (PT) Flowsheets (Taken 01/06/2024 1213) LTG: Pt will maintain dynamic standing balance during mobility activities with:: (downgraded for pt overall safety 2/2 slow progress) Minimal Assistance - Patient > 75%   Problem: Sit to Stand Goal: LTG:  Patient will perform sit to stand with assistance level (PT) Description: LTG:  Patient will perform sit to stand with assistance level (PT) Flowsheets (Taken 01/06/2024 1213) LTG: PT will perform sit to stand in preparation for functional mobility with assistance level: (downgraded for pt overall safety 2/2 slow progress) Minimal Assistance - Patient > 75%   Problem: RH Bed Mobility Goal: LTG Patient will perform bed mobility with assist (PT) Description: LTG: Patient will perform bed mobility with assistance, with/without cues (PT). Flowsheets (Taken 01/06/2024 1213) LTG: Pt will perform bed mobility with assistance level of: (downgraded for pt overall safety 2/2 slow progress) Independent with assistive device    Problem: RH Bed to Chair Transfers Goal: LTG Patient will perform bed/chair transfers w/assist (PT) Description: LTG: Patient will perform bed to chair transfers with assistance (PT). Flowsheets (Taken 01/06/2024 1213) LTG: Pt will perform Bed to Chair Transfers with assistance level: (downgraded for pt overall safety 2/2 slow progress) Supervision/Verbal cueing

## 2024-01-06 NOTE — Plan of Care (Signed)
  Problem: RH Tub/Shower Transfers Goal: LTG Patient will perform tub/shower transfers w/assist (OT) Description: LTG: Patient will perform tub/shower transfers with assist, with/without cues using equipment (OT) Outcome: Not Applicable Note: Goal not longer applicable. D/c'd 6/23

## 2024-01-06 NOTE — Progress Notes (Signed)
 Physical Therapy Weekly Progress Note  Patient Details  Name: BELKIS NORBECK MRN: [id number] Date of Birth: [birth year]  Beginning of progress report period: December 28, 2023 End of progress report period: January 06, 2024  Today's Date: 01/06/2024 PT Individual Time: 9199-9148, 8567-8469 PT Individual Time Calculation (min): 51 min, 58 min   Patient has met 4 of 4 short term goals.  Pt is performing bed mobility with supervision/mod I with use of bed features. Slide board transfer with CGA/close supervision, with therapist placing and removing slide board with mod verbal cues provided for head hip ratio. Sit to stand from elevated surface with RW and min-mod A. D/C scheduled for 6/26. Family training completed with pt husband 6/21. Downgraded goals to supervision/min A at [facility] level for pt safety, as pt requires assistance for management of equipment. Pt reports her husband, daughter and friend will be able to provide assistance upon discharge.   Patient continues to demonstrate the following deficits muscle weakness and muscle joint tightness, decreased cardiorespiratoy endurance, and decreased sitting balance, decreased standing balance, decreased balance strategies, and difficulty maintaining precautions and therefore will continue to benefit from skilled PT intervention to increase functional independence with mobility.  Patient not progressing toward long term goals.  See goal revision..  Plan of care revisions: 6/23.  PT Short Term Goals Week 1:  PT Short Term Goal 1 (Week 1): Pt will complete bed mobility with distant supervision PT Short Term Goal 1 - Progress (Week 1): Met PT Short Term Goal 2 (Week 1): Pt will complete bed > chair transfer with CGA + LRAD PT Short Term Goal 2 - Progress (Week 1): Met PT Short Term Goal 3 (Week 1): Pt will complete sit to stand with modA + LRAD PT Short Term Goal 3 - Progress (Week 1): Met PT Short Term Goal 4 (Week 1): Pt will complete 54ft WC propulsion  with minA PT Short Term Goal 4 - Progress (Week 1): Met Week 2:  PT Short Term Goal 1 (Week 2): STG=LTG 2/2 ELOS  Skilled Therapeutic Interventions/Progress Updates:      Treatment Session 1  Pt seated EOB upon arrival. Pt agreeable to therapy. Pt denies any pain.   Pt wearing L LE limb protector.   Discussed pt CLOF and need for assistance with slide board transfer and management of equipment for pt overall safety 2/2 strength/endurance deficits and body habitus. Pt verbalized understanding and reports her husband, daughter and friend will be able to provide assistance upon discharge. Pt agreeable to keeping discharge date as is 6/26. Therapist downgraded goals to reflect this. Pt reports family inability to attend additional family training prior to D/C.   Pt performed slide board transfer bed to [facility], WC<>mat table x2, pt attempted to place slide board however has difficulty performing 2/2 body habitus, cushion and limb protector. Pt performed slide board transfer with supervision, mod verbal cues provided for head hip ratio for pt safety with emphasis on leaning forward.   Pt performed lateral scoot transfer without slide board with min A, max verbal and tactile cues provided for technique with head/hip ratio. Therapist recommended use of slide board for safety 2/2. Pt verablized understanding and agreeable.   Pt seated in WC at end of session with all needs within reach and bed alarm on.   Treatment Session 2   Pt seated in WC upon arrival. Pt agreeable to therapy. Pt complains of soreness on lateral aspcet of L LE residual limb. Doffed limb protector and  shrinker. Noted 6 spots sores on pt residual limb, notified nurse and PA. Donned foam dressing to each. Notified Scott, representative from [employer], and requested he come by to adjust limb protector as needed. Scott plans to come in AM of 6/24. PA provided verbal order to not use limb protector in mean time.   Pt requesting to use  bathroom. Pt performed slide board transfer WC to [facility] with CGA/min A, max verbal cues provided for head hip ratio, with therapist placing/removing board.   Pt continent of bowel/bladder. Pt doffed/donned shorts with min A while performing lateral trunk lean.   Pt performed squat pivot transfer BSC to WC with B UE support on grab bar, verbal cues provided for technique.   Pt seated in WC with all needs within reach and bed alarm on.    Therapy Documentation Precautions:  Precautions Precautions: Fall, Other (comment) Recall of Precautions/Restrictions: Intact Precaution/Restrictions Comments: LLE would vac, L limb protector Restrictions Weight Bearing Restrictions Per Provider Order: Yes LLE Weight Bearing Per Provider Order: Non weight bearing   Therapy/Group: Individual Therapy  [facility], Long, DPT  01/06/2024, 12:24 PM

## 2024-01-06 NOTE — Plan of Care (Signed)
 Patient goals downgraded for patient safety secondary to patient progressing slower than anticipated.  Problem: RH Bathing Goal: LTG Patient will bathe all body parts with assist levels (OT) Description: LTG: Patient will bathe all body parts with assist levels (OT) Flowsheets (Taken 01/06/2024 1216) LTG: Pt will perform bathing with assistance level/cueing: (downgraded secondary to slow patient progress) Supervision/Verbal cueing   Problem: RH Dressing Goal: LTG Patient will perform lower body dressing w/assist (OT) Description: LTG: Patient will perform lower body dressing with assist, with/without cues in positioning using equipment (OT) Flowsheets (Taken 01/06/2024 1220) LTG: Pt will perform lower body dressing with assistance level of: (downgraded secondary to slow patient progress) Supervision/Verbal cueing   Problem: RH Toileting Goal: LTG Patient will perform toileting task (3/3 steps) with assistance level (OT) Description: LTG: Patient will perform toileting task (3/3 steps) with assistance level (OT)  Flowsheets (Taken 01/06/2024 1220) LTG: Pt will perform toileting task (3/3 steps) with assistance level: (downgraded secondary to slow patient progress) Contact Guard/Touching assist   Problem: RH Toilet Transfers Goal: LTG Patient will perform toilet transfers w/assist (OT) Description: LTG: Patient will perform toilet transfers with assist, with/without cues using equipment (OT) Flowsheets (Taken 01/06/2024 1220) LTG: Pt will perform toilet transfers with assistance level of: (downgraded secondary to slow patient progress) Contact Guard/Touching assist

## 2024-01-06 NOTE — Progress Notes (Signed)
 PROGRESS NOTE   Subjective/Complaints: No acute events overnight.  Patient would like PureWick because she had trouble sleeping at night due to having to urinate.   Vitals stable     01/06/2024    1:40 PM 01/06/2024    3:10 AM 01/06/2024    3:00 AM  Vitals with BMI  Weight   226 lbs 3 oz  BMI   37.64  Systolic 135   135 123   Diastolic 37   37 40   Pulse 63   63 71     Recent Labs    01/05/24 2147 01/06/24 0536 01/06/24 1212  GLUCAP 178* 138* 139*    ROS: Patient denies fever, chills, HA, SOB, CP, abdominal pain, N/V/D + Rash improved + Tremor- continued + dysuria  Objective:   VAS US  LOWER EXTREMITY VENOUS (DVT) Result Date: 01/05/2024  Lower Venous DVT Study Patient Name:  Dana [name]  Date of Exam:   01/04/2024 Medical Rec #: [id number]       Accession #:    [id number] Date of Birth: [birth year]       Patient Gender: F Patient Age:   63 years Exam Location:  [facility] Procedure:      VAS US  LOWER EXTREMITY VENOUS (DVT) Referring Phys: JOESPH LIKES --------------------------------------------------------------------------------  Indications: Edema. Other Indications: Left BKA 12/18/2023, now in rehabilitation unit. Risk Factors: AKI on CKD IV. Limitations: Body habitus, poor ultrasound/tissue interface and clothing. Comparison Study: Prior negative bilateral LEV done 12/15/23 Performing Technologist: Alberta Lis RVS  Examination Guidelines: A complete evaluation includes B-mode imaging, spectral Doppler, color Doppler, and power Doppler as needed of all accessible portions of each vessel. Bilateral testing is considered an integral part of a complete examination. Limited examinations for reoccurring indications may be performed as noted. The reflux portion of the exam is performed with the patient in reverse Trendelenburg.  +---------+---------------+---------+-----------+----------+-------------------+ RIGHT     CompressibilityPhasicitySpontaneityPropertiesThrombus Aging      +---------+---------------+---------+-----------+----------+-------------------+ CFV      Full           Yes      No                                       +---------+---------------+---------+-----------+----------+-------------------+ SFJ      Full                                                             +---------+---------------+---------+-----------+----------+-------------------+ FV Prox  Full                                                             +---------+---------------+---------+-----------+----------+-------------------+ FV Mid  Not well visualized +---------+---------------+---------+-----------+----------+-------------------+ FV Distal               Yes      No                   patent by color and                                                       Doppler             +---------+---------------+---------+-----------+----------+-------------------+ PFV      Full                                                             +---------+---------------+---------+-----------+----------+-------------------+ POP      Full           Yes      No                                       +---------+---------------+---------+-----------+----------+-------------------+ PTV      Full                                                             +---------+---------------+---------+-----------+----------+-------------------+ PERO     Full                                                             +---------+---------------+---------+-----------+----------+-------------------+   Left Technical Findings: Left leg not evaluated.   Summary: RIGHT: - There is no evidence of deep vein thrombosis in the lower extremity. However, portions of this examination were limited- see technologist comments above.  - No cystic structure found in  the popliteal fossa. Interstitial edema noted throughout the right lower extremity   *See table(s) above for measurements and observations. Electronically signed by Penne Colorado MD on 01/05/2024 at 10:32:04 AM.    Final     Recent Labs    01/05/24 0610 01/06/24 0513  WBC 8.9 8.5  HGB 7.5* 7.1*  HCT 24.2* 23.2*  PLT 272 246   Recent Labs    01/05/24 0610 01/06/24 0513  NA 134* 131*  K 4.8 4.6  CL 99 101  CO2 25 24  GLUCOSE 123* 125*  BUN 66* 64*  CREATININE 3.29* 3.18*  CALCIUM  8.4* 8.0*    Intake/Output Summary (Last 24 hours) at 01/06/2024 1645 Last data filed at 01/06/2024 1300 Gross per 24 hour  Intake 900 ml  Output --  Net 900 ml         Physical Exam: Vital Signs Blood pressure (!) 135/37, pulse 63, temperature 98.2 F (36.8 C), temperature source Oral, resp. rate 14, height 5' 5 (1.651  m), weight 102.6 kg, SpO2 96%.  Constitutional: No distress . Vital signs reviewed.  Sitting in WC, appears comfortable HEENT: NCAT, EOMI, oral membranes moist Neck: supple Cardiovascular: RRR without murmur. No JVD   , trace RLE edema Respiratory/Chest: CTA Bilaterally without wheezes or rales. Normal effort    GI/Abdomen: BS +, sl tender, non-distended, bruising Psych: pleasant and cooperative  Skin:  Incision well-approximated, no areas of active drainage or dehiscence.  Mild amount of edema, without erythema.  Healing very well.   Rash more disseminated along right lower extremity, and with larger nonblanchable nodules with some blanchable erythema in between.  No textural changes or raised lesions.  Nontender, no reduced or altered sensation, no itching.--Unchanged 6/23  Neuro:  Alert and oriented x 3. Normal insight and awareness. Intact Memory. Normal language and speech. Cranial nerve exam unremarkable. MMT: BUE 4+ to 5/5. LLE limited by vac, pain, at least 3 to 3+/5. RLE 4 to 4+/5. Sensory exam normal for light touch and pain in all 4 limbs. No limb ataxia or  cerebellar signs. No abnormal tone appreciated.  . Mild b/l UE intention tremor  Prior neuro assessment is c/w today's exam 01/06/2024.   Musculoskeletal: LBKA Shrinker in place--well-formed.   Assessment/Plan: 1. Functional deficits which require 3+ hours per day of interdisciplinary therapy in a comprehensive inpatient rehab setting. Physiatrist is providing close team supervision and 24 hour management of active medical problems listed below. Physiatrist and rehab team continue to assess barriers to discharge/monitor patient progress toward functional and medical goals  Care Tool:  Bathing    Body parts bathed by patient: Right arm, Left upper leg, Left arm, Right lower leg, Chest, Abdomen, Front perineal area, Face, Left lower leg, Buttocks, Right upper leg (sititng in chair/ EOB. sponge bath)   Body parts bathed by helper: Front perineal area, Buttocks, Right lower leg Body parts n/a: Left lower leg   Bathing assist Assist Level: Contact Guard/Touching assist     Upper Body Dressing/Undressing Upper body dressing   What is the patient wearing?: Pull over shirt    Upper body assist Assist Level: Set up assist    Lower Body Dressing/Undressing Lower body dressing      What is the patient wearing?: Pants, Orthosis, Ace wrap/stump shrinker     Lower body assist Assist for lower body dressing: Minimal Assistance - Patient > 75%     Toileting Toileting    Toileting assist Assist for toileting: Minimal Assistance - Patient > 75%     Transfers Chair/bed transfer  Transfers assist  Chair/bed transfer activity did not occur: Safety/medical concerns  Chair/bed transfer assist level: Contact Guard/Touching assist     Locomotion Ambulation   Ambulation assist   Ambulation activity did not occur: Safety/medical concerns (2/2 endurance deficit and WB restrictions)          Walk 10 feet activity   Assist  Walk 10 feet activity did not occur: Safety/medical  concerns (2/2 endurance deficit and WB restrictions)        Walk 50 feet activity   Assist Walk 50 feet with 2 turns activity did not occur: Safety/medical concerns (2/2 endurance deficit and WB restrictions)         Walk 150 feet activity   Assist Walk 150 feet activity did not occur: Safety/medical concerns (2/2 endurance deficit and WB restrictions)         Walk 10 feet on uneven surface  activity   Assist Walk 10 feet on uneven surfaces  activity did not occur: Safety/medical concerns (2/2 endurance deficit and WB restrictions)         Wheelchair     Assist Is the patient using a wheelchair?: Yes Type of Wheelchair: Manual    Wheelchair assist level: Supervision/Verbal cueing Max wheelchair distance: 100    Wheelchair 50 feet with 2 turns activity    Assist        Assist Level: Supervision/Verbal cueing   Wheelchair 150 feet activity     Assist      Assist Level: Moderate Assistance - Patient 50 - 74%   Blood pressure (!) 135/37, pulse 63, temperature 98.2 F (36.8 C), temperature source Oral, resp. rate 14, height 5' 5 (1.651 m), weight 102.6 kg, SpO2 96%.  Medical Problem List and Plan: 1. 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                -patient may shower if leg is covered, vac pump away from water             -ELOS/Goals: 7-10 days, mod I w/c level, supervision ambulatory level  -Continue CIR therapies including PT, OT   - Expected discharge 6/26  2.  Antithrombotics: -DVT/anticoagulation:  Pharmaceutical: *dc'ed sq heparin  d/t bleeding from sq inj sites  -SCD RLE--DC'd due to bruising/rash, appears stable             -antiplatelet therapy: Aspirin  81 mg daily, Brilinta  90 mg twice daily- - 6/21-22: ongoing hematuria and uptrending BUN to creatinine ratio concerning for bleeding on DAPT.  Hemoglobin has remained stable--defer adjustments to primary given high  risk   6/21: Right lower extremity duplex negative  3. Pain Management: Neurontin  300 mg 3 times daily, oxycodone  as needed             -pt reports her worst pain is from her low back and related left lumbar radiculopathy. No phantom pain currently                         -continue above meds for now. Encouraged oob                           -therapy can work on ROM, posture, etc.  4. Mood/Behavior/Sleep/hallucinations: Lexapro  10 mg daily, BuSpar  15 mg 3 times daily             -mood is positive             -antipsychotic agents: N/A  6-21: DC Lexapro  due to hyponatremia. Add melatonin for sleep 5 mg PRN.  Patient does not want to have your sleep medications due to prior poor experiences.  No evidence of active hallucinations on exam.  6/22: No endorsed hallucinations, delusions at this time.  5. Neuropsych/cognition: This patient is capable of making decisions on her own behalf. 6.  Wound Care/ID:  daptomycin  through 12/28/23 for MRSA bacteremia             -MRSA contact precautions             -pt was changed to traditional vac dressing  6/12 as prevena was not holding suction.  6/15-Still no drainage in cannister. Dressing is due for a change Monday.   **Consider removing Vac Monday if no further drainage in cannister.   -Dr. Harden note indicated plan for 1 week wound vac after DC  -6/19 DC wound VAC today  6/22-23: Wound looks excellent; continue shrinker sock and open to air   7. Fluids/Electrolytes/Nutrition: Routine in and outs with follow-up chemistries 8.  Acute on chronic anemia.   Hgb with drop today to 7.3. minimal sx             -transfused 1u PRBC 6/13             -recheck hgb up to 8.1             -continue Brilinta  for now  -holding heparin  as above  -fe++ supp  -6/16 HGB 7.4, continue to monitor  -6/19  HGB stable 7.6, continue to monitor   6-22: Hemoglobin stable 7.5, see #2 above  6/23 HGB 7.1, nephrology gave ESA, can consider transfusion if goes any  lower   9.  Diabetes mellitus with peripheral neuropathy.  Hemoglobin A1c 10.2.  NovoLog  3 units 3 times daily, Semglee  5 units daily.  Check blood sugars AC and at bedtime  - Blood sugars well-controlled Recent Labs    01/05/24 2147 01/06/24 0536 01/06/24 1212  GLUCAP 178* 138* 139*    Continue current regimen  10.  CAD/stenting/angioplasty as well as history of mitral valve replacement.  Continue aspirin  and Brilinta .  Follow-up cardiology services 11.  Diastolic congestive heart failure.  Coreg  9.375 mg twice daily, Demadex  60 mg daily (hold).  Monitor for any signs of fluid overload             -need weights-stable  - 6/17 Coreg  held by nephrology  -6/18-20 stable continue to monitor NO signs of overload  01-03-21: Stable weights, stable appearance.  Nephrology discontinued midodrine .  6/23 weight  higher, suspect inaccurate reading   Filed Weights   01/04/24 0513 01/05/24 0539 01/06/24 0300  Weight: 95.1 kg 95.5 kg 102.6 kg   t 12.  GERD.  Protonix  13.  Hyperlipidemia.  Zetia  14.  Chronic hypoxic respiratory failure/OSA.  Continue CPAP  15.  CKD stage IV.  Baseline 2.2-2.4.               6/14 -Cr bumped  to 2.7 and sl to 2.81 today              -may be related to anemia              -continue to hold demadex  6/15- Cr continues to trend up-- 3.22  -still think this is due to demadex  she has received up until Friday  - push fluids and recheck BMET in am  -if further increase tomorrow will need to contact nephrology 6/16 Nephrology consulted Cr a little higher today 6/17 nephrologist started midodrine  Coreg  held -Foley removed for UA and culture to be completed 6/19 discussed with nephrology regarding pyuria, since Foley just removed and no symptoms continue to monitor for now and will plan to repeat UA in several days. 6/20 BP has been stable, appears midodrine  has been discontinued, creatinine very slowly improving now at 3.54 6-21: Creatinine downtrending, 3.36 today.   BUN stable 64.  Worsening hyponatremia at 132; labs to evaluate 6-22: Appreciate nephrology recommendations; recommending IgA considering fair amount of hematuria/proteinuria, however likely etiology diabetic kidney disease plus Brilinta  and ?UTI. 6/23 BUN and creatinine improved to 64/3.18, continue to monitor trend      01/06/2024    1:40 PM 01/06/2024    3:10 AM 01/06/2024    3:00 AM  Vitals with BMI  Weight   226 lbs 3 oz  BMI   37.64  Systolic 135  135 123   Diastolic 37   37 40   Pulse 63   63 71     16.  History of gout.  Allopurinol .  Monitor for any gout flares 17. R leg rash  - Hydrocortisone  cream started.  - Advised her not to use the compression stocking device she was using previously.  Neurology considering IgA -6/20 improving, continue current regimen 6-22: diffuse/bruising pattern, duplex negative for DVT but noticed a lot of interstitial edema.  Rash is nontender, not itchy, and localized.  Agree likely localized trauma due to SCD and DAPT.  No changes noted over this weekend.  18. Urinary retention  -Foley removed, monitor voiding trial - 6/18 will message nursing to ensure regular PVRs/Bladder scans, will also ask about U/A- dont see done yet  6/19 patient did not have elevated bladder scans yesterday, has been continent  6/20 PVR 0 this AM, continent  6/21: No PVRs.  Continent of urine.  -Will do purewick at night  19. Occasional tremor  -Renal related? Continue to monitor .   6/19 discussed with nephrology not felt to be uremic related.  Possibly due to discontinuation of beta-blocker  6/20 hopefully will improve when beta-blocker reinitiated  20. Dysuria, likely UTI  -Will send urine sample for cx and plan to start abx after this  -Keflex  started 250mg  Q24h, monitor culture- Addendum- DC renal started cipro   6/21: Urinalysis with large blood, rare bacteria, no leukocyte esterase or nitrates.  DC Cipro . Patient Cr too high for analgesic.   6/22: Cipro   resumed by nephrology, urine cultures with less than 10K.  Will continue now as is nephrology's preference but defer to primary team ongoing necessity--did discuss this with patient.  21.  Hyponatremia.  Downtrending since admission, at 132 today.  - DC Lexapro   - Placed 1200 cc fluid restriction  - Urine studies ordered for today to evaluate; not on any diuretics  6/22: Slightly improved today, 134.  Labs appear borderline normal, slightly hypertonic, liberalize fluids and encourage p.o.'s today.  6/23 Na lower 131, continue to monitor   LOS: 10 days A FACE TO FACE EVALUATION WAS PERFORMED  Murray Collier 01/06/2024, 4:45 PM

## 2024-01-06 NOTE — Progress Notes (Signed)
 Occupational Therapy Session Note  Patient Details  Name: Dana [name] MRN: [id number] Date of Birth: [birth year]  Session 1: Today's Date: 01/06/2024 OT Individual Time: 8991-8894 OT Individual Time Calculation (min): 57 min   Session 2: Today's Date: 01/06/2024 OT Individual Time: 8386-8354 OT Individual Time Calculation (min): 32 min  Short Term Goals: Week 1:  OT Short Term Goal 1 (Week 1): Pt will complete sit to stand at sink  at Max A while completing self care task in order to increase functional performance and standing balance. OT Short Term Goal 1 - Progress (Week 1): Progressing toward goal OT Short Term Goal 2 (Week 1): Pt will complete LB dressing while utilizing either lateral leans or sit to stand with Mod A. OT Short Term Goal 2 - Progress (Week 1): Met OT Short Term Goal 3 (Week 1): Pt will don limb guard with Min A demonstrating improved independence with residual limb management. OT Short Term Goal 3 - Progress (Week 1): Met Week 2:  OT Short Term Goal 1 (Week 2): patient will increase ability to complete LB dressing CGA utilizing lateral leans on EOB OT Short Term Goal 2 (Week 2): patient will complete toilet transfers to [facility] or toilet utilizing slide board with CGA   Session 1:  Skilled Therapeutic Interventions/Progress Updates:   Patient participated in skilled OT session focusing on safety with transfers, mobility, and strengthening. Patient agreeable to participate in OT session. Reports 4/10 pain level in R shoulder. Patient found in wheelchair when OT arrived. Patient completed lateral transfer with touch A to mat table. Patient completed lateral scooting around mat table with use of leg to decrease pressure on arm to relieve shoulder pain. Patient able to complete scoots with mod verbal cues for leg use to increase transfer efficiency and safety. Patient completed core strengthening with dynamic sitting balance on mat table to increase ability to slide board  transfer with increased safety. Patient them completed strengthening of b/l UE strengthening with 2# dumbells in flexion/ abduction to increase UE strength for functional mobility and ADLs and improve stability in shoulder joint. Patient returned to room with all verbalized needs in reach.    Session 2: Skilled Therapeutic Interventions/Progress Updates:  Patient found in room in wheelchair alarm on. Patient able to complete self propulsion in wc for increased functional mobility for 35 ft before fatigue and increased pain. Patient completed sit to stand in parallel bars and increased dynamic standing to increase ADL tolerance/ engagement with ability to wash buttocks, upright positioning, and transfers to weight bear through RLE. Patient able to complete slide board transfer to bed with increased safety to maximize IND. All needs left in reach alarm on.   Therapy Documentation Precautions:  Precautions Precautions: Fall, Other (comment) Recall of Precautions/Restrictions: Intact Precaution/Restrictions Comments: LLE would vac, L limb protector Restrictions Weight Bearing Restrictions Per Provider Order: Yes LLE Weight Bearing Per Provider Order: Non weight bearing  Session 1: Pain: 4/10  Session 2: Pain: 2/10    Therapy/Group: Individual Therapy  D'mariea L Sander Speckman 01/06/2024, 7:44 AM

## 2024-01-06 NOTE — Plan of Care (Signed)
  Problem: Consults Goal: RH LIMB LOSS PATIENT EDUCATION Description: Description: See Patient Education module for eduction specifics. Outcome: Progressing   Problem: RH BOWEL ELIMINATION Goal: RH STG MANAGE BOWEL WITH ASSISTANCE Description: STG Manage Bowel with toileting Assistance. Outcome: Progressing   Problem: RH SAFETY Goal: RH STG ADHERE TO SAFETY PRECAUTIONS W/ASSISTANCE/DEVICE Description: STG Adhere to Safety Precautions With cues Assistance/Device. Outcome: Progressing   Problem: RH PAIN MANAGEMENT Goal: RH STG PAIN MANAGED AT OR BELOW PT'S PAIN GOAL Description: < 4 with prns Outcome: Progressing

## 2024-01-07 ENCOUNTER — Inpatient Hospital Stay (HOSPITAL_COMMUNITY)

## 2024-01-07 DIAGNOSIS — E871 Hypo-osmolality and hyponatremia: Secondary | ICD-10-CM

## 2024-01-07 DIAGNOSIS — M25511 Pain in right shoulder: Secondary | ICD-10-CM

## 2024-01-07 LAB — RENAL FUNCTION PANEL
Albumin: 2.2 g/dL — ABNORMAL LOW (ref 3.5–5.0)
Anion gap: 10 (ref 5–15)
BUN: 60 mg/dL — ABNORMAL HIGH (ref 6–20)
CO2: 21 mmol/L — ABNORMAL LOW (ref 22–32)
Calcium: 8.4 mg/dL — ABNORMAL LOW (ref 8.9–10.3)
Chloride: 102 mmol/L (ref 98–111)
Creatinine, Ser: 3.27 mg/dL — ABNORMAL HIGH (ref 0.44–1.00)
GFR, Estimated: 16 mL/min — ABNORMAL LOW (ref >60)
Glucose, Bld: 119 mg/dL — ABNORMAL HIGH (ref 70–99)
Phosphorus: 4.2 mg/dL (ref 2.5–4.6)
Potassium: 5 mmol/L (ref 3.5–5.1)
Sodium: 133 mmol/L — ABNORMAL LOW (ref 135–145)

## 2024-01-07 LAB — GLUCOSE, CAPILLARY
Glucose-Capillary: 137 mg/dL — ABNORMAL HIGH (ref 70–99)
Glucose-Capillary: 170 mg/dL — ABNORMAL HIGH (ref 70–99)
Glucose-Capillary: 128 mg/dL — ABNORMAL HIGH (ref 70–99)
Glucose-Capillary: 179 mg/dL — ABNORMAL HIGH (ref 70–99)

## 2024-01-07 MED ORDER — LIDOCAINE 5 % EX PTCH: 1.0000 | MEDICATED_PATCH | CUTANEOUS | Status: DC

## 2024-01-07 MED ORDER — TORSEMIDE 20 MG PO TABS
20.0000 mg | ORAL_TABLET | Freq: Every day | ORAL | Status: DC
  Administered 2024-01-07 – 2024-01-09 (×3): 20 mg via ORAL
  Filled 2024-01-07 (×3): qty 1

## 2024-01-07 MED ORDER — DICLOFENAC SODIUM 1 % EX GEL
2.0000 g | Freq: Four times a day (QID) | CUTANEOUS | Status: DC
  Administered 2024-01-07 – 2024-01-09 (×8): 2 g via TOPICAL
  Filled 2024-01-07: qty 100

## 2024-01-07 MED ORDER — CEPHALEXIN 250 MG PO CAPS
500.0000 mg | ORAL_CAPSULE | Freq: Three times a day (TID) | ORAL | Status: DC
  Administered 2024-01-07 – 2024-01-09 (×6): 500 mg via ORAL
  Filled 2024-01-07 (×6): qty 2

## 2024-01-07 NOTE — Progress Notes (Signed)
 Occupational Therapy Session Note  Patient Details  Name: Dana [name] MRN: [id number] Date of Birth: 01-Feb-1961  Today's Date: 01/07/2024 OT Individual Time: 0927-1028 OT Individual Time Calculation (min): 61 min    Short Term Goals: Week 2:  OT Short Term Goal 1 (Week 2): patient will increase ability to complete LB dressing CGA utilizing lateral leans on EOB OT Short Term Goal 2 (Week 2): patient will complete toilet transfers to [facility] or toilet utilizing slide board with CGA Week 3:     Skilled Therapeutic Interventions/Progress Updates:  Patient agreeable to participate in OT session. Reports 9/10 pain level.   Patient participated in skilled OT session focusing on Slide board transfers, activity tolerance, UE pain free ROM, UE strengthening. Patient completed slide board transfer to and from [facility] in room to increase independence with toileting transfers and wc transfers. Patient still requires CGA due to posterior lean when completing slide board requiring mod verbal cues for safety and closer proximity. Patient completed wc mobility  with increased time required due to UE pain. MD notified of pain by Physical therapist and MD ordered xray on shoulder. Patient ocmpleted UE ROM with NuStep UBE to increase UE strength, ROM, and increase activity tolerance required for slide board transfers and self propulsion with wc. Therapist facilitated use of RLE to decrease weight requirements on B/L UE to decrease painful condition. Returned to room following session all needs met alarm on.   Therapy Documentation Precautions:  Precautions Precautions: Fall, Other (comment) Recall of Precautions/Restrictions: Intact Precaution/Restrictions Comments: LLE would vac, L limb protector Restrictions Weight Bearing Restrictions Per Provider Order: Yes LLE Weight Bearing Per Provider Order: Non weight bearing  Pain: 9/10    Therapy/Group: Individual Therapy  D'mariea L Geanna Divirgilio OTR/L  01/07/2024,  7:48 AM

## 2024-01-07 NOTE — Progress Notes (Signed)
 Orthopedic Tech Progress Note Patient Details:  Dana [name] 06-11-1961 [id number]  [employer] already took care of order  Patient ID: Dana [name], female   DOB: [birth year], 63 y.o.   MRN: [id number]  Dana [name] Pac 01/07/2024, 11:08 AM

## 2024-01-07 NOTE — Progress Notes (Addendum)
 PROGRESS NOTE   Subjective/Complaints: Rash is doing better. Continues to have a tremor. Has been having R shoulder pain for 1-2 days. No known injury when it started. Has some abrasions form limb protector not fitting well.   ROS: Patient denies fever, chills, HA, SOB, CP, abdominal pain, N/V/D + Rash improved + Tremor- continued + dysuria- improving + R shoulder pain  Objective:   No results found.   Recent Labs    01/05/24 0610 01/06/24 0513  WBC 8.9 8.5  HGB 7.5* 7.1*  HCT 24.2* 23.2*  PLT 272 246   Recent Labs    01/06/24 0513 01/07/24 0500  NA 131* 133*  K 4.6 5.0  CL 101 102  CO2 24 21*  GLUCOSE 125* 119*  BUN 64* 60*  CREATININE 3.18* 3.27*  CALCIUM  8.0* 8.4*    Intake/Output Summary (Last 24 hours) at 01/07/2024 1301 Last data filed at 01/07/2024 0700 Gross per 24 hour  Intake 518 ml  Output 300 ml  Net 218 ml         Physical Exam: Vital Signs Blood pressure (!) 132/52, pulse 70, temperature 97.8 F (36.6 C), temperature source Oral, resp. rate 17, height 5' 5 (1.651 m), weight 98.8 kg, SpO2 95%.  Constitutional: No distress . Vital signs reviewed.  Sitting in WC, appears comfortable HEENT: NCAT, EOMI, oral membranes moist Neck: supple Cardiovascular: RRR without murmur. No JVD   , trace RLE edema Respiratory/Chest: CTA Bilaterally without wheezes or rales. Normal effort    GI/Abdomen: BS +, sl tender, non-distended, bruising Psych: pleasant and cooperative  Skin:  Incision well-approximated, no areas of active drainage or dehiscence.  Mild amount of edema, without erythema.  Abrasions on L leg covered by border foam dressings.    Rash continues to improve  Neuro:  Alert and oriented x 3. Normal insight and awareness. Intact Memory. Normal language and speech. Cranial nerve exam unremarkable. MMT: BUE 4+ to 5/5. LLE limited by vac, pain, at least 3 to 3+/5. RLE 4 to 4+/5. Sensory  exam normal for light touch and pain in all 4 limbs. No limb ataxia or cerebellar signs. No abnormal tone appreciated.  . Mild b/l UE intention tremor  Prior neuro assessment is c/w today's exam 01/07/2024.   Musculoskeletal: LBKA Shrinker in place--well-formed.  R shoulder pain with abduction/forward flexion. Pain at [facility] joint, superior and lateral shoulder, SS. Neers test positive. Pain with internal and external rotation.   Assessment/Plan: 1. Functional deficits which require 3+ hours per day of interdisciplinary therapy in a comprehensive inpatient rehab setting. Physiatrist is providing close team supervision and 24 hour management of active medical problems listed below. Physiatrist and rehab team continue to assess barriers to discharge/monitor patient progress toward functional and medical goals  Care Tool:  Bathing    Body parts bathed by patient: Right arm, Left upper leg, Left arm, Right lower leg, Chest, Abdomen, Front perineal area, Face, Left lower leg, Buttocks, Right upper leg (sititng in chair/ EOB. sponge bath)   Body parts bathed by helper: Front perineal area, Buttocks, Right lower leg Body parts n/a: Left lower leg   Bathing assist Assist Level: Contact Guard/Touching assist  Upper Body Dressing/Undressing Upper body dressing   What is the patient wearing?: Pull over shirt    Upper body assist Assist Level: Set up assist    Lower Body Dressing/Undressing Lower body dressing      What is the patient wearing?: Pants, Orthosis, Ace wrap/stump shrinker     Lower body assist Assist for lower body dressing: Minimal Assistance - Patient > 75%     Toileting Toileting    Toileting assist Assist for toileting: Minimal Assistance - Patient > 75%     Transfers Chair/bed transfer  Transfers assist  Chair/bed transfer activity did not occur: Safety/medical concerns  Chair/bed transfer assist level: Contact Guard/Touching assist      Locomotion Ambulation   Ambulation assist   Ambulation activity did not occur: Safety/medical concerns (2/2 endurance deficit and WB restrictions)          Walk 10 feet activity   Assist  Walk 10 feet activity did not occur: Safety/medical concerns (2/2 endurance deficit and WB restrictions)        Walk 50 feet activity   Assist Walk 50 feet with 2 turns activity did not occur: Safety/medical concerns (2/2 endurance deficit and WB restrictions)         Walk 150 feet activity   Assist Walk 150 feet activity did not occur: Safety/medical concerns (2/2 endurance deficit and WB restrictions)         Walk 10 feet on uneven surface  activity   Assist Walk 10 feet on uneven surfaces activity did not occur: Safety/medical concerns (2/2 endurance deficit and WB restrictions)         Wheelchair     Assist Is the patient using a wheelchair?: Yes Type of Wheelchair: Manual    Wheelchair assist level: Supervision/Verbal cueing Max wheelchair distance: 100    Wheelchair 50 feet with 2 turns activity    Assist        Assist Level: Supervision/Verbal cueing   Wheelchair 150 feet activity     Assist      Assist Level: Moderate Assistance - Patient 50 - 74%   Blood pressure (!) 132/52, pulse 70, temperature 97.8 F (36.6 C), temperature source Oral, resp. rate 17, height 5' 5 (1.651 m), weight 98.8 kg, SpO2 95%.  Medical Problem List and Plan: 1. 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                -patient may shower if leg is covered, vac pump away from water             -ELOS/Goals: 7-10 days, mod I w/c level, supervision ambulatory level  -Continue CIR therapies including PT, OT   - Expected discharge 6/26  -Team conference tomorrow  2.  Antithrombotics: -DVT/anticoagulation:  Pharmaceutical: *dc'ed sq heparin  d/t bleeding from sq inj sites  -SCD RLE--DC'd due to bruising/rash,  appears stable             -antiplatelet therapy: Aspirin  81 mg daily, Brilinta  90 mg twice daily- - 6/21-22: ongoing hematuria and uptrending BUN to creatinine ratio concerning for bleeding on DAPT.  Hemoglobin has remained stable--defer adjustments to primary given high risk   6/21: Right lower extremity duplex negative  3. Pain Management: Neurontin  300 mg 3 times daily, oxycodone  as needed             -pt reports her worst pain is from her low back and related left lumbar radiculopathy. No phantom pain currently                         -  continue above meds for now. Encouraged oob                           -therapy can work on ROM, posture, etc.  4. Mood/Behavior/Sleep/hallucinations: Lexapro  10 mg daily, BuSpar  15 mg 3 times daily             -mood is positive             -antipsychotic agents: N/A  6-21: DC Lexapro  due to hyponatremia. Add melatonin for sleep 5 mg PRN.  Patient does not want to have your sleep medications due to prior poor experiences.  No evidence of active hallucinations on exam.  6/22: No endorsed hallucinations, delusions at this time.  5. Neuropsych/cognition: This patient is capable of making decisions on her own behalf. 6.  Wound Care/ID:  daptomycin  through 12/28/23 for MRSA bacteremia             -MRSA contact precautions             -pt was changed to traditional vac dressing  6/12 as prevena was not holding suction.  6/15-Still no drainage in cannister. Dressing is due for a change Monday.   **Consider removing Vac Monday if no further drainage in cannister.   -Dr. Harden note indicated plan for 1 week wound vac after DC  -6/19 DC wound VAC today  6/22-23: Wound looks excellent; continue shrinker sock and open to air  6/24 new limb protector ordered, prior was too small  7. Fluids/Electrolytes/Nutrition: Routine in and outs with follow-up chemistries 8.  Acute on chronic anemia.   Hgb with drop today to 7.3. minimal sx             -transfused 1u PRBC 6/13              -recheck hgb up to 8.1             -continue Brilinta  for now  -holding heparin  as above  -fe++ supp  -6/16 HGB 7.4, continue to monitor  -6/19  HGB stable 7.6, continue to monitor   6-22: Hemoglobin stable 7.5, see #2 above  6/23 HGB 7.1, nephrology gave ESA, can consider transfusion if goes any lower   9.  Diabetes mellitus with peripheral neuropathy.  Hemoglobin A1c 10.2.  NovoLog  3 units 3 times daily, Semglee  5 units daily.  Check blood sugars AC and at bedtime  - Blood sugars well-controlled Recent Labs    01/06/24 2105 01/07/24 0625 01/07/24 1134  GLUCAP 163* 137* 170*    Continue current regimen  10.  CAD/stenting/angioplasty as well as history of mitral valve replacement.  Continue aspirin  and Brilinta .  Follow-up cardiology services 11.  Diastolic congestive heart failure.  Coreg  9.375 mg twice daily, Demadex  60 mg daily (hold).  Monitor for any signs of fluid overload             -need weights-stable  - 6/17 Coreg  held by nephrology  -6/18-20 stable continue to monitor NO signs of overload  01-03-21: Stable weights, stable appearance.  Nephrology discontinued midodrine .  6/23 weight  higher, suspect inaccurate reading   [facility] Weights   01/05/24 0539 01/06/24 0300 01/07/24 0534  Weight: 95.5 kg 102.6 kg 98.8 kg   t 12.  GERD.  Protonix  13.  Hyperlipidemia.  Zetia  14.  Chronic hypoxic respiratory failure/OSA.  Continue CPAP  15.  CKD stage IV.  Baseline 2.2-2.4.  6/14 -Cr bumped  to 2.7 and sl to 2.81 today              -may be related to anemia              -continue to hold demadex  6/15- Cr continues to trend up-- 3.22  -still think this is due to demadex  she has received up until Friday  - push fluids and recheck BMET in am  -if further increase tomorrow will need to contact nephrology 6/16 Nephrology consulted Cr a little higher today 6/17 nephrologist started midodrine  Coreg  held -Foley removed for UA and culture to be completed 6/19  discussed with nephrology regarding pyuria, since Foley just removed and no symptoms continue to monitor for now and will plan to repeat UA in several days. 6/20 BP has been stable, appears midodrine  has been discontinued, creatinine very slowly improving now at 3.54 6-21: Creatinine downtrending, 3.36 today.  BUN stable 64.  Worsening hyponatremia at 132; labs to evaluate 6-22: Appreciate nephrology recommendations; recommending IgA considering fair amount of hematuria/proteinuria, however likely etiology diabetic kidney disease plus Brilinta  and ?UTI. 6/23 BUN and creatinine improved to 64/3.18, continue to monitor trend -6/24 Cr a little higher at 3.26 Bun down to 60, Avoid hypotension, continue to hold coreg       01/07/2024    5:34 AM 01/06/2024    7:33 PM 01/06/2024    1:40 PM  Vitals with BMI  Weight 217 lbs 13 oz    BMI 36.25    Systolic 132 138 864   135  Diastolic 52 68 37   37  Pulse 70 66 63   63    16.  History of gout.  Allopurinol .  Monitor for any gout flares 17. R leg rash  - Hydrocortisone  cream started.  - Advised her not to use the compression stocking device she was using previously.  Neurology considering IgA -6/20 improving, continue current regimen 6-22: diffuse/bruising pattern, duplex negative for DVT but noticed a lot of interstitial edema.  Rash is nontender, not itchy, and localized.  Agree likely localized trauma due to SCD and DAPT.  No changes noted over this weekend. 6/24 improving  18. Urinary retention  -Foley removed, monitor voiding trial - 6/18 will message nursing to ensure regular PVRs/Bladder scans, will also ask about U/A- dont see done yet  6/19 patient did not have elevated bladder scans yesterday, has been continent  6/20 PVR 0 this AM, continent  6/21: No PVRs.  Continent of urine.  -Will do purewick at night   19. Occasional tremor  -Renal related? Continue to monitor .   6/19 discussed with nephrology not felt to be uremic related.   Possibly due to discontinuation of beta-blocker  6/20 hopefully will improve when beta-blocker reinitiated  20. Dysuria, likely UTI  -Will send urine sample for cx and plan to start abx after this  -Keflex  started 250mg  Q24h, monitor culture- Addendum- DC renal started cipro   6/21: Urinalysis with large blood, rare bacteria, no leukocyte esterase or nitrates.  DC Cipro . Patient Cr too high for analgesic.   6/22: Cipro  resumed by nephrology, urine cultures with less than 10K.  Will continue now as is nephrology's preference but defer to primary team ongoing necessity--did discuss this with patient.  6/24 pharmacy asks about changing to keflex  due to culture results- ok to change  21.  Hyponatremia.  Downtrending since admission, at 132 today.  - DC Lexapro   - Placed 1200 cc fluid restriction  -  Urine studies ordered for today to evaluate; not on any diuretics  6/22: Slightly improved today, 134.  Labs appear borderline normal, slightly hypertonic, liberalize fluids and encourage p.o.'s today.  6/24 Na 133, stable,  continue to monitor   22. R shoulder pain  -Xray ordered, likely OA vs Rotator cuff aggravated by UE activity, lidocaine  patch  LOS: 11 days A FACE TO FACE EVALUATION WAS PERFORMED  Dana [name] 01/07/2024, 1:01 PM

## 2024-01-07 NOTE — Progress Notes (Signed)
 Physical Therapy Session Note  Patient Details  Name: Dana [name] MRN: [id number] Date of Birth: March 15, 1961  Today's Date: 01/07/2024 PT Individual Time: 8951-8884 PT Individual Time Calculation (min): 27 min   Short Term Goals: Week 2:  PT Short Term Goal 1 (Week 2): STG=LTG 2/2 ELOS  Skilled Therapeutic Interventions/Progress Updates:      Pt sitting in wheelchair, agreeable to therapy treatment. She reports 9.5/10 R shoulder pain that has been ongoing - rest breaks and mobility provided for pain management. Deferred wheelchair mobility training due to shoulder discomfort.   Transported at w/c level to main gym and setup inside // bars. Sit<>stands in // bars needing modA to power up to rise. Once standing, able to stand with CGA to SBA with BUE support. Standing there-ex completed with 3-way hip exercises, 2x10 each, providing visual cue to help increase AROM. Finished session working on standing balance with alternating UE lift's from // bar with CGA for balance. Increased unsteadiness when lifting her LUE > RUE.   Pt returned to her room and was left sitting up in wheelchair with seat belt alarm on, call bell within reach.   Therapy Documentation Precautions:  Precautions Precautions: Fall, Other (comment) Recall of Precautions/Restrictions: Intact Precaution/Restrictions Comments: LLE would vac, L limb protector Restrictions Weight Bearing Restrictions Per Provider Order: Yes LLE Weight Bearing Per Provider Order: Non weight bearing General:      Therapy/Group: Individual Therapy  Dana [name] Dana [name] 01/07/2024, 12:00 PM

## 2024-01-07 NOTE — Progress Notes (Signed)
 Patient ID: Dana [name], female   DOB: 03/01/1961, 63 y.o.   MRN: [id number] S: Feels better today.  O:BP (!) 132/52 (BP Location: Right Arm)   Pulse 70   Temp 97.8 F (36.6 C) (Oral)   Resp 17   Ht 5' 5 (1.651 m)   Wt 98.8 kg   SpO2 95%   BMI 36.25 kg/m   Intake/Output Summary (Last 24 hours) at 01/07/2024 1309 Last data filed at 01/07/2024 0700 Gross per 24 hour  Intake 518 ml  Output 300 ml  Net 218 ml   Intake/Output: I/O last 3 completed shifts: In: 1418 [P.O.:1418] Out: 300 [Urine:300]  Intake/Output this shift:  No intake/output data recorded. Weight change: -3.8 kg Gen:NAD CVS: RRR Resp:CTA Abd: +BS, soft, NT/ND Ext: trace RLE edema, s/p LBKA  Recent Labs  Lab 01/01/24 0504 01/02/24 0510 01/02/24 0511 01/03/24 0622 01/04/24 0718 01/05/24 0610 01/06/24 0513 01/07/24 0500  NA 134* 134* 133* 134* 132* 134* 131* 133*  K 4.5 4.8 4.8 4.6 3.9 4.8 4.6 5.0  CL 98 99 99 96* 99 99 101 102  CO2 26 22 22  21* 25 25 24  21*  GLUCOSE 151* 129* 129* 109* 103* 123* 125* 119*  BUN 61* 64* 64* 64* 64* 66* 64* 60*  CREATININE 3.71* 3.61* 3.57* 3.54* 3.36* 3.29* 3.18* 3.27*  ALBUMIN  2.2*  --  2.2* 2.3* 2.2*  --   --  2.2*  CALCIUM  8.6* 8.7* 8.6* 8.6* 8.3* 8.4* 8.0* 8.4*  PHOS 5.0*  --  5.4* 4.9* 4.9*  --   --  4.2   Liver Function Tests: Recent Labs  Lab 01/03/24 0622 01/04/24 0718 01/07/24 0500  ALBUMIN  2.3* 2.2* 2.2*   No results for input(s): LIPASE, AMYLASE in the last 168 hours. No results for input(s): AMMONIA in the last 168 hours. CBC: Recent Labs  Lab 01/02/24 0510 01/05/24 0610 01/06/24 0513  WBC 11.4* 8.9 8.5  NEUTROABS  --  6.4  --   HGB 7.6* 7.5* 7.1*  HCT 24.2* 24.2* 23.2*  MCV 89.6 89.3 89.9  PLT 316 272 246   Cardiac Enzymes: No results for input(s): CKTOTAL, CKMB, CKMBINDEX, TROPONINI in the last 168 hours. CBG: Recent Labs  Lab 01/06/24 1212 01/06/24 1750 01/06/24 2105 01/07/24 0625 01/07/24 1134  GLUCAP 139* 151*  163* 137* 170*    Iron  Studies: No results for input(s): IRON , TIBC, TRANSFERRIN, FERRITIN in the last 72 hours. Studies/Results: No results found.  allopurinol   50 mg Oral Daily   aspirin  EC  81 mg Oral Daily   busPIRone   15 mg Oral TID   Chlorhexidine  Gluconate Cloth  6 each Topical Q12H   ciprofloxacin   500 mg Oral Q breakfast   darbepoetin (ARANESP ) injection - NON-DIALYSIS  200 mcg Subcutaneous Q Sat-1800   ezetimibe   10 mg Oral Daily   gabapentin   300 mg Oral TID   hydrocortisone  cream   Topical TID   insulin  aspart  0-6 Units Subcutaneous TID AC & HS   insulin  aspart  3 Units Subcutaneous TID WC   insulin  glargine-yfgn  5 Units Subcutaneous Daily   latanoprost   1 drop Both Eyes QHS   lidocaine   1 patch Transdermal Q24H   pantoprazole   40 mg Oral Daily   polyethylene glycol  17 g Oral Daily   senna  2 tablet Oral QHS   ticagrelor   90 mg Oral BID    BMET    Component Value Date/Time   NA 133 (L) 01/07/2024 0500  NA 143 07/29/2023 1138   K 5.0 01/07/2024 0500   CL 102 01/07/2024 0500   CO2 21 (L) 01/07/2024 0500   GLUCOSE 119 (H) 01/07/2024 0500   BUN 60 (H) 01/07/2024 0500   BUN 57 (H) 07/29/2023 1138   CREATININE 3.27 (H) 01/07/2024 0500   CREATININE 1.88 (H) 07/19/2022 1005   CALCIUM  8.4 (L) 01/07/2024 0500   CALCIUM  9.3 10/04/2023 1414   GFRNONAA 16 (L) 01/07/2024 0500   GFRNONAA 30 (L) 07/19/2022 1005   GFRAA 43 (L) 06/28/2020 1003   CBC    Component Value Date/Time   WBC 8.5 01/06/2024 0513   RBC 2.58 (L) 01/06/2024 0513   HGB 7.1 (L) 01/06/2024 0513   HGB 10.0 (L) 07/29/2023 1138   HCT 23.2 (L) 01/06/2024 0513   HCT 32.6 (L) 07/29/2023 1138   PLT 246 01/06/2024 0513   PLT 309 07/29/2023 1138   MCV 89.9 01/06/2024 0513   MCV 89 07/29/2023 1138   MCV 84 04/26/2022 0000   MCH 27.5 01/06/2024 0513   MCHC 30.6 01/06/2024 0513   RDW 17.5 (H) 01/06/2024 0513   RDW 14.5 07/29/2023 1138   LYMPHSABS 1.3 01/05/2024 0610   LYMPHSABS 1.8  07/12/2023 1105   MONOABS 0.7 01/05/2024 0610   EOSABS 0.4 01/05/2024 0610   EOSABS 0.3 07/12/2023 1105   BASOSABS 0.1 01/05/2024 0610   BASOSABS 0.1 07/12/2023 1105    Assessment/Recommendations: Dana [name] is a/an 63 y.o. female with a past medical history significant for DM1, CAD, GERD, CHF, COPD, history of cardiac arrest who present w/ gangrenous foot s/p amputation c/b AKI    Nonoliguric AKI on CKD 4: Baseline creatinine 2.2-2.7.  Creatinine got up to 3.7 but slolwy improving to 3.18- 3.27 today.  Possibly was from low blood pressures - Workup overall benign.  Creatinine starting to improve-  also with clinical evidence of UTI-  started cipro  -Blood pressure now higher.  Stopped midodrine  -Will resume torsemide  but at 20 mg daily (was on 60 mg at home)    - diabetic kidney disease seems the most likely plus brilinta  and UTI.   -Continue to monitor for low blood pressures -Encourage p.o. intake   -Monitor Daily I/Os, Daily weight  -Maintain MAP>65 for optimal renal perfusion. -   crt a little stalled today    CHF: Volume status appears stable.  No concerns. Holding coreg  for now.  Torsemide  as above.   Uncontrolled DM1 with hyperglycemia: Management per primary team   Left BKA: Continue with rehab per primary team   Anemia: Likely multifactorial.  Iron  somewhat low but has allergy .  Would recommend a transfusion if Hgb continues to drop.-  gave dose of ESA as well   UTI-  symptomatic-  will start cipro  while culture is cooking -   would be my preference to continue cipro  given that she was symptomatic and had pyuria    Hyponartremia-  very mild-  urine osm appropriate-   no action needed   Dana RONAL Sellar, MD [facility]

## 2024-01-07 NOTE — Progress Notes (Addendum)
 Physical Therapy Session Note  Patient Details  Name: Dana [name] MRN: [id number] Date of Birth: [birth year]  Today's Date: 01/07/2024 PT Individual Time: 0800 - 0828, 1401-1444 Pt individual Treatment calculation: 28 min, 43 min      Short Term Goals: Week 2:  PT Short Term Goal 1 (Week 2): STG=LTG 2/2 ELOS  Skilled Therapeutic Interventions/Progress Updates:      Treatment Session 1  Pt seated EOB upon arrival. Pt agreeable to therapy. Pt reports 10/10 R shoulder pain. Pt TTP at [facility] joint. Pt deneis any difficulty with propelling WC and performing slide board transfer,  however reports and demonstrates pain at rest and with performing overhead shoulder raise.  Therapist provided rest breaks and repsoitioning as needed and donned hot pack to R shoulder.   Pt reports ability to dress herself today while seated EOB with use of lateral trunk lean technique.   Pt performed slide board transfer bed to WC. Therpaist provided video of equipment management/set up for family reference upon discharge home. Pt performed with supervision, min verbal cues provided for head hip ratio.   Pt brushed her air and teeth from WC level.   Pt seated in WC at end of session with all needs within reach and bed alarm on.   Treatment Session 2   Pt seated in WC upon arrival. Pt agreeable to therapy. Pt denies any pain.   Pt requesting to use bathroom. Pt performed slide board transfer WC <>BSC (next to bed), with CGA uphill and supervision downhill, pt required assistance placing the board WC to [facility] but able to place correctly BSC to [facility], verbal cues provided for technique. Pt required verbal cues for removing arm rest. Pt continent of bowel, pt donned/doffed pantsand performed pericare  with supervision.   Pt performed slide board transfer WC to car simulator with therapist providing recording on pt phone for pt and family to reference. Education provided regarding positioning of WC, how to fold WC for  transport. Pt performed with supervision with assistance with placement of board.  Pt overall reports feeling more prepared for D/C on Thursday 6/26.   Pt with OT at end of session.      Therapy Documentation Precautions:  Precautions Precautions: Fall, Other (comment) Recall of Precautions/Restrictions: Intact Precaution/Restrictions Comments: LLE would vac, L limb protector Restrictions Weight Bearing Restrictions Per Provider Order: Yes LLE Weight Bearing Per Provider Order: Non weight bearing  Therapy/Group: Individual Therapy  [facility] Doreene Orris, [place], DPT  01/07/2024, 7:48 AM

## 2024-01-07 NOTE — Plan of Care (Signed)
  Problem: Consults Goal: RH LIMB LOSS PATIENT EDUCATION Description: Description: See Patient Education module for eduction specifics. Outcome: Progressing   Problem: RH BOWEL ELIMINATION Goal: RH STG MANAGE BOWEL WITH ASSISTANCE Description: STG Manage Bowel with toileting Assistance. Outcome: Progressing   Problem: RH SAFETY Goal: RH STG ADHERE TO SAFETY PRECAUTIONS W/ASSISTANCE/DEVICE Description: STG Adhere to Safety Precautions With cues Assistance/Device. Outcome: Progressing   Problem: RH PAIN MANAGEMENT Goal: RH STG PAIN MANAGED AT OR BELOW PT'S PAIN GOAL Description: < 4 with prns Outcome: Progressing

## 2024-01-08 ENCOUNTER — Telehealth (HOSPITAL_COMMUNITY): Payer: Self-pay | Admitting: Pharmacy Technician

## 2024-01-08 ENCOUNTER — Other Ambulatory Visit (HOSPITAL_COMMUNITY): Payer: Self-pay

## 2024-01-08 ENCOUNTER — Telehealth: Payer: Self-pay

## 2024-01-08 ENCOUNTER — Telehealth (HOSPITAL_COMMUNITY): Payer: Self-pay

## 2024-01-08 DIAGNOSIS — N3000 Acute cystitis without hematuria: Secondary | ICD-10-CM

## 2024-01-08 LAB — RENAL FUNCTION PANEL
Albumin: 2.1 g/dL — ABNORMAL LOW (ref 3.5–5.0)
Anion gap: 11 (ref 5–15)
BUN: 56 mg/dL — ABNORMAL HIGH (ref 6–20)
CO2: 22 mmol/L (ref 22–32)
Calcium: 8.4 mg/dL — ABNORMAL LOW (ref 8.9–10.3)
Chloride: 101 mmol/L (ref 98–111)
Creatinine, Ser: 3.33 mg/dL — ABNORMAL HIGH (ref 0.44–1.00)
GFR, Estimated: 15 mL/min — ABNORMAL LOW (ref >60)
Glucose, Bld: 144 mg/dL — ABNORMAL HIGH (ref 70–99)
Phosphorus: 4.3 mg/dL (ref 2.5–4.6)
Potassium: 4.6 mmol/L (ref 3.5–5.1)
Sodium: 134 mmol/L — ABNORMAL LOW (ref 135–145)

## 2024-01-08 LAB — CBC
HCT: 26.4 % — ABNORMAL LOW (ref 36.0–46.0)
Hemoglobin: 8.2 g/dL — ABNORMAL LOW (ref 12.0–15.0)
MCH: 28 pg (ref 26.0–34.0)
MCHC: 31.1 g/dL (ref 30.0–36.0)
MCV: 90.1 fL (ref 80.0–100.0)
Platelets: 301 10*3/uL (ref 150–400)
RBC: 2.93 MIL/uL — ABNORMAL LOW (ref 3.87–5.11)
RDW: 17.5 % — ABNORMAL HIGH (ref 11.5–15.5)
WBC: 10.1 10*3/uL (ref 4.0–10.5)
nRBC: 0 % (ref 0.0–0.2)

## 2024-01-08 LAB — GLUCOSE, CAPILLARY
Glucose-Capillary: 144 mg/dL — ABNORMAL HIGH (ref 70–99)
Glucose-Capillary: 147 mg/dL — ABNORMAL HIGH (ref 70–99)
Glucose-Capillary: 124 mg/dL — ABNORMAL HIGH (ref 70–99)
Glucose-Capillary: 110 mg/dL — ABNORMAL HIGH (ref 70–99)

## 2024-01-08 MED ORDER — LORAZEPAM 0.5 MG PO TABS
0.5000 mg | ORAL_TABLET | Freq: Every day | ORAL | 0 refills | Status: DC | PRN
  Filled 2024-01-08: qty 30, 30d supply, fill #0

## 2024-01-08 MED ORDER — OXYCODONE HCL 7.5 MG PO TABS
7.5000 mg | ORAL_TABLET | ORAL | 0 refills | Status: DC | PRN
  Filled 2024-01-08: qty 30, fill #0

## 2024-01-08 MED ORDER — LANTUS SOLOSTAR 100 UNIT/ML ~~LOC~~ SOPN: 5.0000 [IU] | PEN_INJECTOR | Freq: Every day | SUBCUTANEOUS | Status: DC

## 2024-01-08 MED ORDER — MAGNESIUM OXIDE 400 MG PO TABS
800.0000 mg | ORAL_TABLET | Freq: Every day | ORAL | 0 refills | Status: DC
  Filled 2024-01-08: qty 60, 30d supply, fill #0

## 2024-01-08 MED ORDER — LIDOCAINE 4 % EX PTCH
1.0000 | MEDICATED_PATCH | CUTANEOUS | 0 refills | Status: AC
  Filled 2024-01-08: qty 30, 30d supply, fill #0
  Filled 2024-01-08: qty 12, 12d supply, fill #0

## 2024-01-08 MED ORDER — INSULIN PEN NEEDLE 32G X 4 MM MISC
0 refills | Status: AC
Start: 2024-01-08 — End: ?
  Filled 2024-01-08: qty 100, 100d supply, fill #0

## 2024-01-08 MED ORDER — TICAGRELOR 90 MG PO TABS
90.0000 mg | ORAL_TABLET | Freq: Two times a day (BID) | ORAL | 0 refills | Status: DC
  Filled 2024-01-08 – 2024-06-18 (×2): qty 60, 30d supply, fill #0

## 2024-01-08 MED ORDER — LINACLOTIDE 145 MCG PO CAPS
145.0000 ug | ORAL_CAPSULE | Freq: Every day | ORAL | 0 refills | Status: AC
Start: 2024-01-08 — End: ?
  Filled 2024-01-08: qty 30, 30d supply, fill #0

## 2024-01-08 MED ORDER — DICLOFENAC SODIUM 1 % EX GEL
2.0000 g | Freq: Four times a day (QID) | CUTANEOUS | 0 refills | Status: AC
  Filled 2024-01-08: qty 100, 14d supply, fill #0

## 2024-01-08 MED ORDER — OXYCODONE HCL 5 MG PO TABS
5.0000 mg | ORAL_TABLET | ORAL | 0 refills | Status: DC | PRN
  Filled 2024-01-08: qty 30, 5d supply, fill #0

## 2024-01-08 MED ORDER — PANTOPRAZOLE SODIUM 40 MG PO TBEC
40.0000 mg | DELAYED_RELEASE_TABLET | Freq: Every day | ORAL | 0 refills | Status: DC
  Filled 2024-01-08 – 2024-04-21 (×2): qty 30, 30d supply, fill #0

## 2024-01-08 MED ORDER — EZETIMIBE 10 MG PO TABS
10.0000 mg | ORAL_TABLET | Freq: Every day | ORAL | 0 refills | Status: DC
Start: 2024-01-08 — End: 2024-06-01
  Filled 2024-01-08: qty 30, 30d supply, fill #0

## 2024-01-08 MED ORDER — CEPHALEXIN 500 MG PO CAPS
500.0000 mg | ORAL_CAPSULE | Freq: Two times a day (BID) | ORAL | 0 refills | Status: DC
  Filled 2024-01-08: qty 5, 3d supply, fill #0

## 2024-01-08 MED ORDER — GABAPENTIN 300 MG PO CAPS
300.0000 mg | ORAL_CAPSULE | Freq: Two times a day (BID) | ORAL | 0 refills | Status: DC
  Filled 2024-01-08: qty 60, 30d supply, fill #0

## 2024-01-08 MED ORDER — ESCITALOPRAM OXALATE 10 MG PO TABS
10.0000 mg | ORAL_TABLET | Freq: Every day | ORAL | 0 refills | Status: DC
Start: 2024-01-08 — End: 2024-01-08
  Filled 2024-01-08: qty 30, 30d supply, fill #0

## 2024-01-08 MED ORDER — BUSPIRONE HCL 15 MG PO TABS
15.0000 mg | ORAL_TABLET | Freq: Three times a day (TID) | ORAL | 0 refills | Status: DC
Start: 2024-01-08 — End: 2024-04-21
  Filled 2024-01-08: qty 90, 30d supply, fill #0

## 2024-01-08 MED ORDER — ALLOPURINOL 100 MG PO TABS
50.0000 mg | ORAL_TABLET | Freq: Every day | ORAL | 0 refills | Status: DC
  Filled 2024-01-08 – 2024-03-12 (×2): qty 30, 60d supply, fill #0

## 2024-01-08 MED ORDER — INSULIN GLARGINE 100 UNIT/ML SOLOSTAR PEN
5.0000 [IU] | PEN_INJECTOR | Freq: Every day | SUBCUTANEOUS | 11 refills | Status: DC
Start: 2024-01-08 — End: 2024-04-02
  Filled 2024-01-08 – 2024-02-27 (×2): qty 3, 28d supply, fill #0

## 2024-01-08 NOTE — Progress Notes (Incomplete)
 ADVANCED HF CLINIC NOTE   PCP: Sherre Clapper, MD HF Cardiologist: Toribio Fuel, MD  Chief Complaint: Heart Failure Follow-up  HPI: Dana [name] is 63 y.o. female with history of diastolic heart failure s/p cardiomems 02/2018, CAD, s/p DES RCA and LAD 2018, DMI, HTN, HLD, PAD, and OSA on CPAP, PAF s/p ablation, PAD s/p L great toe and 4th toe amputation, stage IIIb CKD, PEA arrest in 12/2020, severe MR s/p mTEER in 2022.   LHC 3/22 showed moderate mid-LAD and mid-RCA disease, severe mid-L-Cx stenosis s/p PCI/DES mid Cx and side branch angioplasty of OM1.  10/22 she was enrolled in FASTR trial and diuresed > 15 lb. RHC 10/22 showed mild to moderate elevating filling pressures with large v-waves in PCWP tracing suggesting severe MR vs severe diastolic dysfunction. TEE 55-60% showed severe central MR she underwent TEER of mitral valve with placement of 1XT W and 1NT W clip on 05/2021. NT W clip was entangled and had to be deployed into lateral part of valve. No additional clip placement could be preformed. Echo 11/04 with EF 50-55% and residual moderate MR.   Multiple admissions over the last year related to ACS/CP/NSTEMI requiring LHC and PCI (see cardiac studies below). 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 Has narrowly missed HD during multiple admissions:   Seen in [facility] 10/15/23. Doing well. Volume well controlled. CardioMems 10-14. Cr was down to 2.2. On Torsemide  60 daily with Furosicix PRN.   Since has been multiple times in ED/UC for low back pain and ankle pain after a mechanical fall. Admitted from ortho clinic 4/28 for surgical repair of ankle fracture and dislocation. She was then re-admitted for MRSA bacteremia of surgical wound, she underwent L BKA 12/18/23. TEE at this time (-) for endocarditis, but severe MR. She was discharge to inpatient rehab.   {he/she:32517} returns today for *** follow up. Overall feeling ***. NYHA ***. Reports {Symptoms;  cardiac:12860::dyspnea,fatigue}. Denies {Symptoms; cardiac:12860::chest pain,dyspnea,fatigue,near-syncope,orthopnea,palpitations,dizziness,abnormal bleeding}. Able to perform ADLs. Appetite okay. Weight at home ***. BP at home***. Compliant with all medications.  Cardiac Studies: - Echo 12/24: EF 55-60%, RV normal, MVg unchanged at 5 mmHg - LHC (1/25): PTCA to prox LAD - LHC (12/24): multivessel CAD. Proximal LAD restenosis - LHC (8/24):  PTCA/DES x 1 proximal LAD, chronic occlusion mid Circ beyond the stented segment, patent obtuse marginal branch with severe distal stenosis (too small for PCI), dominant RCA with moderate proximal stenosis, patent distal stent, LVEDP 19 mmhg. - Echo (5/24): EF 50-55%. RV mildly reduced, post mitral clip x 2 with XTW and NTW the latter entangled in chords, no obvious SLD noted, mean gradient 5 mmHg, mild residual MR appears medial to the two clips. - LHC (5/24): DES to LAD, osial prox RCA with severe 85% stenosis-->will need staged intervention when renal function stabilized. - LHC (2/24): 2v obstructive CAD. New 90% stenosis pLAD with RFR 0.48. The stent in the LCx is occluded after a large first OM. Patent stent in the distal RCA. S/p PCI of pLAD with DES x 1 overlapping with prior stent. - Echo (1/24): EF 55-60%, normal RV and moderate MR, mG 7.5 mmHg, essentially unchanged from prior echo.   Review of systems complete and found to be negative unless listed in HPI.    Past Medical History:  Diagnosis Date   Acquired absence of left great toe (HCC)    Acquired absence of other left toe(s) (HCC)    ACS (acute coronary syndrome) (HCC)  Acute bronchitis due to COVID-19 virus 03/03/2023   Acute on chronic combined systolic and diastolic CHF (congestive heart failure) (HCC) 09/16/2023   Acute respiratory failure with hypoxia (HCC) 07/20/2023   Anemia    Anoxic brain injury (HCC)    Anxiety    CAD (coronary artery disease)    DES mLAD  04/07/15; DES dRCA & DES pPDA 08/30/16; DES mCX & PTCA OM1 09/29/20   Charcot's joint of foot in type 2 diabetes mellitus (HCC) 12/15/2023   Chronic diastolic (congestive) heart failure (HCC)    Chronic pain    Chronic systolic (congestive) heart failure (HCC)    CKD (chronic kidney disease)    Contrast dye induced nephropathy, possible 09/03/2020   COPD (chronic obstructive pulmonary disease) (HCC)    COVID-19 virus infection 03/03/2023   Demand ischemia (HCC)    Diabetes (HCC)type 1    Diastolic CHF (HCC)    Dyspnea    Encounter for screening mammogram for malignant neoplasm of breast 09/16/2023   Family history of breast cancer 10/23/2021   Gastro-esophageal reflux disease without esophagitis    Hyperlipidemia    Hypertension    Hypertensive heart disease with heart failure (HCC)    Hypoxemia    Idiopathic gout, multiple sites    Leukocytosis 03/29/2022   Localized edema    Low back pain    Mitral regurgitation    Mitral regurgitation    Mixed hyperlipidemia    Mixed incontinence    Morbid (severe) obesity due to excess calories (HCC)    Neuromuscular disorder (HCC)    Neuropathy    Non-ST elevation (NSTEMI) myocardial infarction (HCC)    08/29/20, 09/29/20   OSA (obstructive sleep apnea) 09/03/2020   Other specified hypothyroidism    PAF (paroxysmal atrial fibrillation) (HCC)    s/p pulmonary vein isolation by cryoablation 10/04/15 Dr. Meldon in [facility]   PEA (Pulseless electrical activity) [facility])    ~ 01/13/21 at [facility] during admission DKA, volume overload, possible CAP, hypoxia s/p ACLS with ROSC ~ 10-15   Pneumonia    Primary insomnia    Rheumatoid arthritis (HCC)    Stroke (HCC)    Thyroid  disease    Type 1 diabetes mellitus (HCC)    URI with cough and congestion 08/22/2023   Vitamin D  deficiency    No current facility-administered medications for this visit.   Current Outpatient Medications  Medication Sig Dispense Refill   allopurinol  (ZYLOPRIM ) 100  MG tablet Take 0.5 tablets (50 mg total) by mouth daily. 30 tablet 0   busPIRone  (BUSPAR ) 15 MG tablet Take 1 tablet (15 mg total) by mouth 3 (three) times daily. 90 tablet 0   cephALEXin  (KEFLEX ) 500 MG capsule Take 1 capsule (500 mg total) by mouth 2 (two) times daily. 5 capsule 0   diclofenac Sodium (VOLTAREN) 1 % GEL Apply 2 g topically 4 (four) times daily. 100 g 0   ezetimibe  (ZETIA ) 10 MG tablet Take 1 tablet (10 mg total) by mouth daily. 30 tablet 0   gabapentin  (NEURONTIN ) 300 MG capsule Take 1 capsule (300 mg total) by mouth 2 (two) times daily. 60 capsule 0   insulin  glargine (LANTUS ) 100 UNIT/ML Solostar Pen Inject 5 Units into the skin daily. Expires 28 days after first use 15 mL 11   Insulin  Pen Needle 32G X 4 MM MISC Use with Lantus  pen 100 each 0   lidocaine  (LIDODERM ) 5 % Place 1 patch onto the skin daily. Remove & Discard patch within 12  hours or as directed by MD 30 patch 0   linaclotide  (LINZESS ) 145 MCG CAPS capsule Take 1 capsule (145 mcg total) by mouth daily before breakfast. 30 capsule 0   LORazepam  (ATIVAN ) 0.5 MG tablet Take 1 tablet (0.5 mg total) by mouth daily as needed for anxiety. 30 tablet 0   magnesium  oxide (MAG-OX) 400 MG tablet Take 2 tablets (800 mg total) by mouth daily. Take 2 (400 mg) daily=800 mg 60 tablet 0   oxyCODONE  (ROXICODONE ) 5 MG immediate release tablet Take 1 tablet (5 mg total) by mouth every 4 (four) hours as needed for severe pain (pain score 7-10). 30 tablet 0   pantoprazole  (PROTONIX ) 40 MG tablet Take 1 tablet (40 mg total) by mouth daily. 30 tablet 0   ticagrelor  (BRILINTA ) 90 MG TABS tablet Take 1 tablet (90 mg total) by mouth 2 (two) times daily. 60 tablet 0   Facility-Administered Medications Ordered in Other Visits  Medication Dose Route Frequency Provider Last Rate Last Admin   acetaminophen  (TYLENOL ) tablet 650 mg  650 mg Oral Q6H PRN Angiulli, Daniel J, PA-C   650 mg at 01/04/24 1321   Or   acetaminophen  (TYLENOL ) suppository 650  mg  650 mg Rectal Q6H PRN Angiulli, Daniel J, PA-C       allopurinol  (ZYLOPRIM ) tablet 50 mg  50 mg Oral Daily Angiulli, Daniel J, PA-C   50 mg at 01/08/24 9288   aspirin  EC tablet 81 mg  81 mg Oral Daily Angiulli, Daniel J, PA-C   81 mg at 01/08/24 9288   busPIRone  (BUSPAR ) tablet 15 mg  15 mg Oral TID Angiulli, Daniel J, PA-C   15 mg at 01/08/24 9288   cephALEXin  (KEFLEX ) capsule 500 mg  500 mg Oral Q8H Urbano Albright, MD   500 mg at 01/08/24 9377   Chlorhexidine  Gluconate Cloth 2 % PADS 6 each  6 each Topical Q12H Urbano Albright, MD   6 each at 01/01/24 0500   Darbepoetin Alfa  (ARANESP ) injection 200 mcg  200 mcg Subcutaneous Q Sat-1800 [place]borough, Kellie, MD   200 mcg at 01/04/24 1826   diclofenac Sodium (VOLTAREN) 1 % topical gel 2 g  2 g Topical QID Pegge Toribio PARAS, PA-C   2 g at 01/08/24 9287   ezetimibe  (ZETIA ) tablet 10 mg  10 mg Oral Daily Angiulli, Daniel J, PA-C   10 mg at 01/08/24 9288   gabapentin  (NEURONTIN ) capsule 300 mg  300 mg Oral TID Angiulli, Daniel J, PA-C   300 mg at 01/08/24 9288   hydrocortisone  cream 1 %   Topical TID Urbano Albright, MD   Given at 01/08/24 9287   insulin  aspart (novoLOG ) injection 0-6 Units  0-6 Units Subcutaneous TID AC & HS Angiulli, Daniel J, PA-C   1 Units at 01/07/24 2208   insulin  aspart (novoLOG ) injection 3 Units  3 Units Subcutaneous TID WC Angiulli, Daniel J, PA-C   3 Units at 01/07/24 1228   insulin  glargine-yfgn (SEMGLEE ) injection 5 Units  5 Units Subcutaneous Daily Angiulli, Daniel J, PA-C   5 Units at 01/08/24 9288   latanoprost  (XALATAN ) 0.005 % ophthalmic solution 1 drop  1 drop Both Eyes QHS AngiulliToribio PARAS, PA-C   1 drop at 01/07/24 2109   lidocaine  (LIDODERM ) 5 % 1 patch  1 patch Transdermal Q24H Urbano Albright, MD       LORazepam  (ATIVAN ) tablet 0.5 mg  0.5 mg Oral QID PRN Pegge Toribio PARAS, PA-C   0.5 mg at 01/06/24 2217  melatonin tablet 5 mg  5 mg Oral QHS PRN Emeline Search C, DO       oxyCODONE  (Oxy  IR/ROXICODONE ) immediate release tablet 7.5 mg  7.5 mg Oral Q4H PRN Pegge Toribio PARAS, PA-C   7.5 mg at 01/08/24 9377   pantoprazole  (PROTONIX ) EC tablet 40 mg  40 mg Oral Daily Pegge Toribio PARAS, PA-C   40 mg at 01/08/24 9288   polyethylene glycol (MIRALAX  / GLYCOLAX ) packet 17 g  17 g Oral Daily Pegge Toribio PARAS, PA-C   17 g at 01/06/24 9195   senna (SENOKOT) tablet 17.2 mg  2 tablet Oral QHS Pegge Toribio PARAS, PA-C   17.2 mg at 01/05/24 2040   ticagrelor  (BRILINTA ) tablet 90 mg  90 mg Oral BID Pegge Toribio PARAS, PA-C   90 mg at 01/08/24 0711   torsemide  (DEMADEX ) tablet 20 mg  20 mg Oral Daily Rayburn Pac, MD   20 mg at 01/08/24 9288   Allergies  Allergen Reactions   Naproxen Hives and Rash   Shellfish-Derived Products Hives, Swelling and Other (See Comments)    Angioedema    Strawberry (Diagnostic) Hives   Celecoxib Other (See Comments)    Stomach pain   Doxepin  Hcl Other (See Comments)    Lethargic, sleeping a lot per pt and family   Glucosamine Hives, Swelling and Other (See Comments)    Angioedema    Iron  Hives and Other (See Comments)    IV iron  transfusion - hives - Feb 2022 hospitalization  Patient said she CAN tolerate if given Benadryl  first   Metoclopramide Other (See Comments)    Facial twitching and stuttering   Metolazone  Other (See Comments)    Acute renal failure   Statins Other (See Comments)    Muscle pain   Social History   Socioeconomic History   Marital status: Married    Spouse name: [name]   Number of children: 2   Years of education: 16   Highest education level: Master's degree (e.g., MA, Dana, MEng, MEd, MSW, MBA)  Occupational History   Occupation: on disability/retired early former Engineer, site  Tobacco Use   Smoking status: Former    Current packs/day: 0.00    Types: Cigarettes    Start date: 74    Quit date: 1984    Years since quitting: 41.5    Passive exposure: Past   Smokeless tobacco: Never  Vaping Use   Vaping  status: Never Used  Substance and Sexual Activity   Alcohol use: Never   Drug use: Never   Sexual activity: Not on file  Other Topics Concern   Not on file  Social History Narrative   Not on file   Social Drivers of Health   Financial Resource Strain: Low Risk  (10/24/2023)   Overall Financial Resource Strain (CARDIA)    Difficulty of Paying Living Expenses: Not hard at all  Food Insecurity: No Food Insecurity (12/14/2023)   Hunger Vital Sign    Worried About Running Out of Food in the Last Year: Never true    Ran Out of Food in the Last Year: Never true  Transportation Needs: No Transportation Needs (12/14/2023)   PRAPARE - Administrator, Civil Service (Medical): No    Lack of Transportation (Non-Medical): No  Physical Activity: Insufficiently Active (10/24/2023)   Exercise Vital Sign    Days of Exercise per Week: 3 days    Minutes of Exercise per Session: 30 min  Stress: Stress Concern Present (10/24/2023)  [employer] of Occupational Health - Occupational Stress Questionnaire    Feeling of Stress : To some extent  Social Connections: Moderately Integrated (12/14/2023)   Social Connection and Isolation Panel    Frequency of Communication with Friends and Family: More than three times a week    Frequency of Social Gatherings with Friends and Family: Three times a week    Attends Religious Services: More than 4 times per year    Active Member of Clubs or Organizations: No    Attends Banker Meetings: Never    Marital Status: Married  Catering manager Violence: Not At Risk (12/14/2023)   Humiliation, Afraid, Rape, and Kick questionnaire    Fear of Current or Ex-Partner: No    Emotionally Abused: No    Physically Abused: No    Sexually Abused: No   Family History  Problem Relation Age of Onset   Breast cancer Mother 5       contralateral breast ca dx 53   Coronary artery disease Mother    Hypertension Mother    Hyperlipidemia Mother     Diabetes type II Mother    Lung cancer Mother 28   Diabetes Father    Hypertension Father    Mental illness Sister    Bipolar disorder Other    Depression Other    Schizophrenia Other    Cancer Other        MGF's sisters x2; unknown type   There were no vitals taken for this visit.  Wt Readings from Last 3 Encounters:  01/07/24 98.8 kg (217 lb 13 oz)  12/14/23 98 kg (216 lb 0.8 oz)  11/12/23 97.1 kg (214 lb 0.4 oz)   PHYSICAL EXAM: General:  Well appearing. No resp difficulty HEENT: normal Neck: supple. no JVD. Carotids 2+ bilat; no bruits. No lymphadenopathy or thryomegaly appreciated. Cor: PMI nondisplaced. Regular rate & rhythm. No rubs, gallops or murmurs. Lungs: clear Abdomen: obese soft, nontender, nondistended. No hepatosplenomegaly. No bruits or masses. Good bowel sounds. Extremities: no cyanosis, clubbing, rash, edema + TED Neuro: alert & orientedx3, cranial nerves grossly intact. moves all 4 extremities w/o difficulty. Affect pleasant   CardioMEMs: 10 08/02/23. Goal 10 Range 10-14 Personally reviewed   EKG SB 57 bpm (Personally reviewed)    ASSESSMENT & PLAN:  1. CAD, H/o NSTEMIs - H/o multiple PCI with stents to mid LAD and pRCA - NSTEMI (2/22). Cath deferred due to AKI. Treated medically.  - NSTEMI (7/22)-->S/p PCI/DES to mid Cx, balloon angioplasty to OM1  - LHC (2/24) for chest pain--> new 90% stenosis pLAD. Successful PCI of pLAD with DES x 1 overlapping with prior stent.  - LHC (5/24) --> DES to LAD, severe 85% ostial prox RCA will need eventual intervention but renal function precludes.  - LHC (8/24): PTCA/DES x 1 proximal LAD - LHC (12/24): with proximal LAD restenosis. S/p successful balloon angioplasty 07/18/23 - Doing well. No s/s angina  - Continue ASA + Brilinta . - Continue Coreg . - Continue Repatha  + Zetia  (allergy  statins). Recent LDL 28 12/24 - Continue Imdur   2. Chronic Diastolic Heart Failure.  - Echo (3/22): EF 55-60%, Grade II DD, normal  RV - RHC (10/22): mild to moderate elevated filling pressures, large v-waves PCWP tracing - TEE (10/22): EF 55-60%, basal inferior akinesis, severe central MR.>>Underwent TEER>>mod residual MR - Echo (11/22): EF 50-55%, severe LV HK, moderate MR - Echo (12/24): EF 55-60%, LV with no RWMA, RV normal, mild MR mG 5 mmHg - Stable/improved NYHA  III. Volume ok today on exam and cardiomems - Continue torsemide  60 mg daily + 20 KCL daily. - No SGLT2i with DM1 and recurrent UTIs - Recent labs reviewed Scr 2.21   3. CKD IV - Prior h/o CIN following LHC - Previous SCr baseline 1.6-1.8 with multiple AKIs over the last year. New baseline ~ 2.0-2.5 - Followed by Dr. Prescilla. Last SCr 2.2 - AKI post cath (5/24), 60 cc contrast used. SCr 2.7>4.25 - AKI post cath (8/24), 80 cc contrast used. SCr peak at 5.19  4. MR  - Severe central MR on TEE on 10/22.   - S/p TEER 11/22 - two clips placed, one became entangled and had to be deployed. - Moderate residual MR on echo 11/22 - Echo (5/24): post mitral clip x 2 with XTW and NTW the latter entangled in chords no obvious SLD noted mean gradient 5 mmHg. Mild residual MR appears medial to the two clips  - Echo 12/24: Stable moderate MR   5. OSA - Continue CPAP - No change   6. DM I - On insulin  - Recent A1c 9 10/24 - No SGLT2i with DM1 and recurrent UTIs   Dana Blimi Godby, NP  12:13 PM

## 2024-01-08 NOTE — Progress Notes (Signed)
 Occupational Therapy Session Note  Patient Details  Name: Dana [name] MRN: [id number] Date of Birth: 1960-10-17  Today's Date: 01/08/2024 OT Individual Time: 0737-0820 OT Individual Time Calculation (min): 43 min    Short Term Goals: Week 2:  OT Short Term Goal 1 (Week 2): patient will increase ability to complete LB dressing CGA utilizing lateral leans on EOB OT Short Term Goal 2 (Week 2): patient will complete toilet transfers to [facility] or toilet utilizing slide board with CGA  Skilled Therapeutic Interventions/Progress Updates:    Pt received sitting up in the w/c with pain reporting in her R shoulder- imaging performed yesterday, MD has not yet rounded with results. Focused initially on pt changing shrinker and performing daily desensitization exercises. She was guided through gentle rubbing, tapping, and different textures rubbing on limb. She changed her shrinker with min A to don posteriorly. Despite multiple attempts to fix the positioning the shrinker continued to roll down. Changed to ace wrap to ensure proper fit and compression. Limb guard donned with min A. She completed a transfer bariatric BSC <> w/c with the slideboard. She continues to require cueing and assist to place board correctly and then hold it steady during the transfer. Attempted transfer without the board and this was more safe without pt worrying about the SB and focusing on lifting through the RLE. She complained of R shoulder pain really limiting these transfers. Pt was left sitting up in the wheelchair with all needs met and call bell within reach.   Therapy Documentation Precautions:  Precautions Precautions: Fall, Other (comment) Recall of Precautions/Restrictions: Intact Precaution/Restrictions Comments: LLE would vac, L limb protector Restrictions Weight Bearing Restrictions Per Provider Order: Yes LLE Weight Bearing Per Provider Order: Non weight bearing  Therapy/Group: Individual Therapy  Nena VEAR Moats 01/08/2024, 6:29 AM

## 2024-01-08 NOTE — Progress Notes (Signed)
 Patient ID: FELCIA HUEBERT, female   DOB: 1961/02/07, 63 y.o.   MRN: [id number] S: No new complaints. O:BP (!) 116/51 (BP Location: Left Arm)   Pulse 65   Temp 98.7 F (37.1 C) (Oral)   Resp 19   Ht 5' 5 (1.651 m)   Wt 98.8 kg   SpO2 92%   BMI 36.25 kg/m   Intake/Output Summary (Last 24 hours) at 01/08/2024 1437 Last data filed at 01/08/2024 1015 Gross per 24 hour  Intake 358 ml  Output 250 ml  Net 108 ml   Intake/Output: I/O last 3 completed shifts: In: 354 [P.O.:354] Out: 300 [Urine:300]  Intake/Output this shift:  Total I/O In: 240 [P.O.:240] Out: 250 [Urine:250] Weight change:  Gen: NAD CVS: RRR Resp:CTA Abd: +BS, soft, NT/ND Ext: s/p LBKA, trace RLE edema with compression stockings.   Recent Labs  Lab 01/02/24 0511 01/03/24 0622 01/04/24 0718 01/05/24 0610 01/06/24 0513 01/07/24 0500 01/08/24 0455  NA 133* 134* 132* 134* 131* 133* 134*  K 4.8 4.6 3.9 4.8 4.6 5.0 4.6  CL 99 96* 99 99 101 102 101  CO2 22 21* 25 25 24  21* 22  GLUCOSE 129* 109* 103* 123* 125* 119* 144*  BUN 64* 64* 64* 66* 64* 60* 56*  CREATININE 3.57* 3.54* 3.36* 3.29* 3.18* 3.27* 3.33*  ALBUMIN  2.2* 2.3* 2.2*  --   --  2.2* 2.1*  CALCIUM  8.6* 8.6* 8.3* 8.4* 8.0* 8.4* 8.4*  PHOS 5.4* 4.9* 4.9*  --   --  4.2 4.3   Liver Function Tests: Recent Labs  Lab 01/04/24 0718 01/07/24 0500 01/08/24 0455  ALBUMIN  2.2* 2.2* 2.1*   No results for input(s): LIPASE, AMYLASE in the last 168 hours. No results for input(s): AMMONIA in the last 168 hours. CBC: Recent Labs  Lab 01/02/24 0510 01/05/24 0610 01/06/24 0513  WBC 11.4* 8.9 8.5  NEUTROABS  --  6.4  --   HGB 7.6* 7.5* 7.1*  HCT 24.2* 24.2* 23.2*  MCV 89.6 89.3 89.9  PLT 316 272 246   Cardiac Enzymes: No results for input(s): CKTOTAL, CKMB, CKMBINDEX, TROPONINI in the last 168 hours. CBG: Recent Labs  Lab 01/07/24 1134 01/07/24 1656 01/07/24 2133 01/08/24 0545 01/08/24 1134  GLUCAP 170* 128* 179* 144* 147*     Iron  Studies: No results for input(s): IRON , TIBC, TRANSFERRIN, FERRITIN in the last 72 hours. Studies/Results: DG Shoulder Right Result Date: 01/07/2024 CLINICAL DATA:  Right shoulder pain for 2 weeks without known injury. EXAM: RIGHT SHOULDER - 2+ VIEW COMPARISON:  August 27, 2023. FINDINGS: Deformity of proximal right humeral head and neck is noted consistent with old fracture. No acute fracture or dislocation is noted. IMPRESSION: Probable old proximal right humeral head and neck fracture. No acute abnormality seen. Electronically Signed   By: Lynwood Landy Raddle M.D.   On: 01/07/2024 16:52    allopurinol   50 mg Oral Daily   aspirin  EC  81 mg Oral Daily   busPIRone   15 mg Oral TID   cephALEXin   500 mg Oral Q8H   Chlorhexidine  Gluconate Cloth  6 each Topical Q12H   darbepoetin (ARANESP ) injection - NON-DIALYSIS  200 mcg Subcutaneous Q Sat-1800   diclofenac Sodium  2 g Topical QID   ezetimibe   10 mg Oral Daily   gabapentin   300 mg Oral TID   hydrocortisone  cream   Topical TID   insulin  aspart  0-6 Units Subcutaneous TID AC & HS   insulin  aspart  3 Units Subcutaneous TID  WC   insulin  glargine-yfgn  5 Units Subcutaneous Daily   latanoprost   1 drop Both Eyes QHS   lidocaine   1 patch Transdermal Q24H   pantoprazole   40 mg Oral Daily   polyethylene glycol  17 g Oral Daily   senna  2 tablet Oral QHS   ticagrelor   90 mg Oral BID   torsemide   20 mg Oral Daily    BMET    Component Value Date/Time   NA 134 (L) 01/08/2024 0455   NA 143 07/29/2023 1138   K 4.6 01/08/2024 0455   CL 101 01/08/2024 0455   CO2 22 01/08/2024 0455   GLUCOSE 144 (H) 01/08/2024 0455   BUN 56 (H) 01/08/2024 0455   BUN 57 (H) 07/29/2023 1138   CREATININE 3.33 (H) 01/08/2024 0455   CREATININE 1.88 (H) 07/19/2022 1005   CALCIUM  8.4 (L) 01/08/2024 0455   CALCIUM  9.3 10/04/2023 1414   GFRNONAA 15 (L) 01/08/2024 0455   GFRNONAA 30 (L) 07/19/2022 1005   GFRAA 43 (L) 06/28/2020 1003   CBC     Component Value Date/Time   WBC 8.5 01/06/2024 0513   RBC 2.58 (L) 01/06/2024 0513   HGB 7.1 (L) 01/06/2024 0513   HGB 10.0 (L) 07/29/2023 1138   HCT 23.2 (L) 01/06/2024 0513   HCT 32.6 (L) 07/29/2023 1138   PLT 246 01/06/2024 0513   PLT 309 07/29/2023 1138   MCV 89.9 01/06/2024 0513   MCV 89 07/29/2023 1138   MCV 84 04/26/2022 0000   MCH 27.5 01/06/2024 0513   MCHC 30.6 01/06/2024 0513   RDW 17.5 (H) 01/06/2024 0513   RDW 14.5 07/29/2023 1138   LYMPHSABS 1.3 01/05/2024 0610   LYMPHSABS 1.8 07/12/2023 1105   MONOABS 0.7 01/05/2024 0610   EOSABS 0.4 01/05/2024 0610   EOSABS 0.3 07/12/2023 1105   BASOSABS 0.1 01/05/2024 0610   BASOSABS 0.1 07/12/2023 1105     Assessment/Recommendations: BERLIE PERSKY is a/an 63 y.o. female with a past medical history significant for DM1, CAD, GERD, CHF, COPD, history of cardiac arrest who present w/ gangrenous foot s/p amputation c/b AKI    Nonoliguric AKI on CKD 4: Baseline creatinine 2.2-2.7.  Creatinine got up to 3.7 but slolwy improving to 3.18- 3.27 today.  Possibly was from low blood pressures - Workup overall benign.  Creatinine starting to improve-  also with clinical evidence of UTI-  started cipro  -Blood pressure now higher.  Stopped midodrine  -Resumed torsemide  but at 20 mg daily (was on 60 mg at home)    - diabetic kidney disease seems the most likely plus brilinta  and UTI.   -Continue to monitor for low blood pressures -Encourage p.o. intake   -Monitor Daily I/Os, Daily weight  -Maintain MAP>65 for optimal renal perfusion. -   crt a little stalled today    CHF: Volume status appears stable.  No concerns. Holding coreg  for now.  Torsemide  as above.   Uncontrolled DM1 with hyperglycemia: Management per primary team   Left BKA: Continue with rehab per primary team   Anemia: Likely multifactorial.  Iron  somewhat low but has allergy .  Would recommend a transfusion if Hgb continues to drop.-  gave dose of ESA as well   UTI-   symptomatic-  will start cipro  while culture is cooking -   would be my preference to continue cipro  given that she was symptomatic and had pyuria    Hyponartremia-  very mild-  urine osm appropriate-   no action needed  [name], MD [facility]

## 2024-01-08 NOTE — Telephone Encounter (Signed)
 Pharmacy Patient Advocate Encounter  Received notification from [facility] [employer] that Prior Authorization for Lidocaine  5% patches  has been DENIED.  Full denial letter will be uploaded to the media tab. See denial reason below.   PA #/Case ID/Reference #: [id number]

## 2024-01-08 NOTE — Progress Notes (Signed)
 Physical Therapy Discharge Summary  Patient Details  Name: Dana [name] MRN: [id number] Date of Birth: [birth year]  Date of Discharge from PT service:{Time; dates multiple:304500300}  {CHL IP REHAB PT TIME CALCULATION:304800500}   Patient has met {NUMBERS 0-12:18577} of {NUMBERS 0-12:18577} long term goals due to {due un:6958322}.  Patient to discharge at [facility] level {LOA:3049010}.   Patient's care partner {care partner:3041650} to provide the necessary {assistance:3041652} assistance at discharge.  Reasons goals not met: ***  Recommendation:  Patient will benefit from ongoing skilled PT services in {setting:3041680} to continue to advance safe functional mobility, address ongoing impairments in ***, and minimize fall risk.  Equipment: WC, hospital bed, pt has RW, and ramped entrance    Reasons for discharge: treatment goals met and discharge from hospital  Patient/family agrees with progress made and goals achieved: Yes  PT Discharge Precautions/Restrictions Restrictions Weight Bearing Restrictions Per Provider Order: Yes LLE Weight Bearing Per Provider Order: Non weight bearing Vital Signs   Pain Pain Assessment Pain Scale: 0-10 Pain Score: 5  Pain Location: Shoulder Pain Interference Pain Interference Pain Effect on Sleep: 4. Almost constantly Pain Interference with Therapy Activities: 1. Rarely or not at all Pain Interference with Day-to-Day Activities: 1. Rarely or not at all Vision/Perception  Vision - History Ability to See in Adequate Light: 0 Adequate Perception Perception: Within Functional Limits Praxis Praxis: WFL  Cognition Overall Cognitive Status: Within Functional Limits for tasks assessed Arousal/Alertness: Awake/alert Orientation Level: Oriented X4 Memory: Appears intact Awareness: Appears intact Problem Solving: Appears intact Safety/Judgment: Appears intact Sensation Sensation Light Touch: Impaired by gross  assessment Additional Comments: B LE neuropathy pain, and intermittent L LE phantom sensations at night Coordination Gross Motor Movements are Fluid and Coordinated: No Fine Motor Movements are Fluid and Coordinated: Yes Coordination and Movement Description: 2/2 L LE  BKA Motor  Motor Motor: Other (comment) Motor - Skilled Clinical Observations: impaired d/t left BKA  Mobility   Locomotion     Trunk/Postural Assessment     Balance   Extremity Assessment      RLE Assessment RLE Assessment: Exceptions to [facility] General Strength Comments: gross 4/5 LLE Assessment LLE Assessment: Exceptions to [facility] General Strength Comments: grossly 3/5   Doreene Orris 01/08/2024, 11:53 AM

## 2024-01-08 NOTE — Progress Notes (Signed)
 Inpatient Rehabilitation Discharge Medication Review by a Pharmacist  A complete drug regimen review was completed for this patient to identify any potential clinically significant medication issues.  High Risk Drug Classes Is patient taking? Indication by Medication  Antipsychotic No   Anticoagulant No   Antibiotic Yes Keflex  - UTI  Opioid Yes Oxycodone  prn pain  Antiplatelet Yes Aspirin , ticagrelor  - CAD, stent  Hypoglycemics/insulin  Yes Insulin /glucagon  - DM  Vasoactive Medication Yes Carvedilol  - HTN NTG - CP  Chemotherapy No   Other Yes Buspirone  - anxiety Ezetimibe /fish oil/Repatha  - HLD Gabapentin  - pain Xalatan  - glaucoma Lorazepam  prn anxiety Pantoprazole  - Reflux  Linzess /miralax  - constipation Voltaren gel - pain Allopurinol  - gout Vitamin D /magOx - supplement Lidocaine  patch/tylenol /Icy Hot - pain Probiotics - supplement while on antibiotic     Type of Medication Issue Identified Description of Issue Recommendation(s)  Drug Interaction(s) (clinically significant)     Duplicate Therapy     Allergy      No Medication Administration End Date     Incorrect Dose     Additional Drug Therapy Needed     Significant med changes from prior encounter (inform family/care partners about these prior to discharge).    Other       Clinically significant medication issues were identified that warrant physician communication and completion of prescribed/recommended actions by midnight of the next day:  No  Name of provider notified for urgent issues identified:   Provider Method of Notification:     Pharmacist comments: None  Time spent performing this drug regimen review (minutes):  20 minutes  Sergio Batch, PharmD, [place], AAHIVP, CPP Infectious Disease Pharmacist 01/08/2024 10:04 AM

## 2024-01-08 NOTE — Telephone Encounter (Signed)
 Pharmacy Patient Advocate Encounter   Received notification from Inpatient Request that prior authorization for Lidocaine  5% patches is required/requested.   Insurance verification completed.   The patient is insured through [facility] ADVANTAGE/RX ADVANCE .   Per test claim: PA required; PA submitted to above mentioned insurance via CoverMyMeds Key/confirmation #/EOC [id number] Status is pending

## 2024-01-08 NOTE — Progress Notes (Signed)
 Patient ID: Dana [name], female   DOB: 10-Dec-1960, 63 y.o.   MRN: [id number] Patient is 3 weeks status post left transtibial amputation.  The wound is well-approximated there is no drainage from the wound.  Patient does have some superficial ulcers lateral to the knee and anterior to the tibial tubercle.  These should resolve uneventfully.  Recommend that she wear the black stump shrinker against the skin at all times.  She will call for follow-up appointment in the office in 2 weeks.

## 2024-01-08 NOTE — Telephone Encounter (Signed)
 Called to confirm/remind patient of their appointment [facility] on 01/09/24.   Appointment:   [] Confirmed  [] Left mess   [] No answer/No voice mail  [x] VM Full/unable to leave message  [] Phone not in service

## 2024-01-08 NOTE — Progress Notes (Signed)
 Occupational Therapy Discharge Summary  Patient Details  Name: Dana [name] MRN: [id number] Date of Birth: 07/16/1961  Date of Discharge from OT service:January 08, 2024 Patient has met 6 of 6 long term goals due to improved activity tolerance, improved balance, postural control, ability to compensate for deficits, functional use of  RIGHT upper, RIGHT lower, and LEFT upper extremity, and improved coordination.  Patient to discharge at overall Supervision level.  Patient's care partner is independent to provide the necessary physical assistance at discharge.      Recommendation:  Patient will benefit from ongoing skilled OT services in home health setting to continue to advance functional skills in the area of BADL, iADL, and Reduce care partner burden.  Equipment: Slide board, wheelchair, hospital bed, reacher, patient to purchase [facility] and tub transfer bench. Has ramp and rolling walker   Reasons for discharge:  treatment goals met Patient/family agrees with progress made and goals achieved: Yes  OT Discharge Precautions/Restrictions  Precautions Precautions: Fall;Other (comment) Recall of Precautions/Restrictions: Intact Precaution/Restrictions Comments: L limb protector Restrictions Weight Bearing Restrictions Per Provider Order: Yes LLE Weight Bearing Per Provider Order: Non weight bearing  Pain Pain Assessment Pain Scale: 0-10 Pain Score: 8  Pain Type: Acute pain Pain Location: Shoulder Pain Orientation: Right ADL ADL Eating: Modified independent Where Assessed-Eating: Bed level Grooming: Modified independent Where Assessed-Grooming: Sitting at sink Upper Body Bathing: Supervision/safety Where Assessed-Upper Body Bathing: Sitting at sink Lower Body Bathing: Supervision/safety Where Assessed-Lower Body Bathing: Sitting at sink Upper Body Dressing: Supervision/safety Where Assessed-Upper Body Dressing: Edge of bed, Wheelchair Lower Body Dressing:  Supervision/safety Where Assessed-Lower Body Dressing: Edge of bed Toileting: Supervision/safety Where Assessed-Toileting: Bedside Commode Toilet Transfer: Close supervision Toilet Transfer Method: Scientist, research (life sciences): Gaffer: Not assessed Film/video editor: Close supervision Film/video editor Method: Best boy: Emergency planning/management officer Vision Baseline Vision/History: 1 Wears glasses Patient Visual Report: No change from baseline Vision Assessment?: No apparent visual deficits Perception  Perception: Within Functional Limits Praxis Praxis: WFL Cognition Cognition Overall Cognitive Status: Within Functional Limits for tasks assessed Arousal/Alertness: Awake/alert Memory: Appears intact Awareness: Appears intact Problem Solving: Appears intact Safety/Judgment: Appears intact Brief Interview for Mental Status (BIMS) Repetition of Three Words (First Attempt): 3 Temporal Orientation: Year: Correct Temporal Orientation: Month: Accurate within 5 days Temporal Orientation: Day: Correct Recall: Sock: Yes, no cue required Recall: Blue: Yes, no cue required Recall: Bed: Yes, after cueing (a piece of furniture) BIMS Summary Score: 14 Sensation Sensation Light Touch: Impaired by gross assessment Hot/Cold: Not tested Proprioception: Appears Intact Stereognosis: Not tested Additional Comments: B LE neuropathy pain, and intermittent L LE phantom sensations at night Coordination Gross Motor Movements are Fluid and Coordinated: No Fine Motor Movements are Fluid and Coordinated: Yes Coordination and Movement Description: 2/2 L LE  BKA Motor  Motor Motor: Other (comment) Motor - Skilled Clinical Observations: impaired d/t left BKA- Mobility  Bed Mobility Bed Mobility: Rolling Right;Rolling Left;Supine to Sit;Sitting - Scoot to [place] of Bed Rolling Right: Independent with assistive  device Rolling Left: Independent with assistive device Supine to Sit: Independent with assistive device Sitting - Scoot to Edge of Bed: Independent with assistive device Transfers Sit to Stand: Maximal Assistance - Patient 25-49%  Trunk/Postural Assessment  Cervical Assessment Cervical Assessment: Within Functional Limits (forward head) Thoracic Assessment Thoracic Assessment: Exceptions to [facility] (rounded shoulders) Lumbar Assessment Lumbar Assessment: Exceptions to [facility] (posterior pelvic tilt) Postural Control Postural Control: Within Functional Limits (increased postural control  to complete slide board transfers. patient continues to demonstrate posterior lean during transfers however able to self correct with sup. verbal cues) Postural Limitations: Limited by L BKA effecting balance  Balance Balance Balance Assessed: Yes Static Sitting Balance Static Sitting - Balance Support: No upper extremity supported;Feet supported Static Sitting - Level of Assistance: 6: Modified independent (Device/Increase time) Dynamic Sitting Balance Dynamic Sitting - Balance Support: No upper extremity supported;Feet supported Dynamic Sitting - Level of Assistance: 5: Stand by assistance (supervision) Static Standing Balance Static Standing - Balance Support: Bilateral upper extremity supported Static Standing - Level of Assistance: 3: Mod assist Static Standing - Comment/# of Minutes: 1 (static standing in parellel bars with UE supported) Extremity/Trunk Assessment RUE Assessment RUE Assessment: Exceptions to [facility] Active Range of Motion (AROM) Comments: WFL in all ranges General Strength Comments: 4-/5 grossly overall. pain in R shoulder limiting function. LUE Assessment LUE Assessment: Exceptions to [facility] Active Range of Motion (AROM) Comments: A/ROM WFL In all ranges General Strength Comments: 4/5 grossly in all ranges.   D'mariea L Jerlean Peralta OTR/L  01/08/2024, 12:59 PM

## 2024-01-08 NOTE — Progress Notes (Signed)
 Physical Therapy Session Note  Patient Details  Name: Dana [name] MRN: [id number] Date of Birth: Dec 23, 1960  Today's Date: 01/08/2024 PT Individual Time: 8884-8843, 8669-8571  PT Individual Time Calculation (min): 41 min, 58 min  Short Term Goals: Week 2:  PT Short Term Goal 1 (Week 2): STG=LTG 2/2 ELOS  Skilled Therapeutic Interventions/Progress Updates:      Treatment Session 1   Pt seated in WC upon arrival. Pt agreeable to therapy. Pt reports 8/10 R shoulder > L LE pain. Therapist provided rest breaks and repositioning.   Scott, representative from [employer], present to assess fit of new limb protector and new shrinker (delievered 6/24).  Doffed limb protector and ace wrap, and foam bandages with total A. Of note: increased redness at incision site and on sores superior to incision site and on lateral aspect of residual limb. Notified nurse, MD and PA.   Requested silicone shrinker to reduce risk of rolling. Donned large foam dressing to lateral aspect of residual limb to reduce unnecessary shear forces.   Therapist assessed discharge components, see discharge note.   Notified social worker to follow up on DME as no DME has been delivered to room, and pt reports she has not heard any updates regarding DME.   Pt husband on phone at end of session, pt and husband deny any questions about discharge 6/26.   Pt seated in WC with all needs within reach and bed alarm on.   Treatment Session 2   Pt seated in WC upon arrival. Pt agreeable to therapy. Pt denies any pain.   Pt husband present at start of session. Offered to review slide board transfer to car simulator. Pt son unable at this time 2/2 time constraints. Informed pt of video of car transfer for reference. Pt husband very appreciative of this.   Pt overall emotionally labile during session, regarding personal circumstances, PT provided emotional support and empathy to increase pt overall well-being and mood.   Reviewed and  performed the following HEP. Handout provided.   Pt performed slide board transfer WC to mat table with supervision, with therapist providing assistance for placing board.   Access Code: [email or web address] URL: [email or web address] Date: 01/08/2024 Prepared by: Doreene Orris  Exercises - Wheelchair Push-Up (AKA)  - 1 x daily - 7 x weekly - 3 sets - 10 reps - Wheelchair Push-Up (AKA)  - 1 x daily - 7 x weekly - 3 sets - 10 reps - Supine Single Leg Bridge with Sound Leg (AKA)  - 1 x daily - 7 x weekly - 3 sets - 10 reps - Sidelying Hip Abduction (AKA)  - 1 x daily - 7 x weekly - 3 sets - 10 reps - Sidelying Hip Abduction with Flexion and Extension (AKA)  - 1 x daily - 7 x weekly - 3 sets - 10 reps   Pt seated in WC with all needs within reach and bed alarm on.   Therapy Documentation Precautions:  Precautions Precautions: Fall, Other (comment) Recall of Precautions/Restrictions: Intact Precaution/Restrictions Comments: LLE would vac, L limb protector Restrictions Weight Bearing Restrictions Per Provider Order: Yes LLE Weight Bearing Per Provider Order: Non weight bearing   Therapy/Group: Individual Therapy  [facility] Doreene Orris, [place], DPT  01/08/2024, 7:53 AM

## 2024-01-08 NOTE — Progress Notes (Signed)
 Occupational Therapy Session Note  Patient Details  Name: Dana [name] MRN: [id number] Date of Birth: Dec 07, 1960  Today's Date: 01/08/2024 OT Individual Time: 9066-8966 OT Individual Time Calculation (min): 60 min    Short Term Goals: Week 2:  OT Short Term Goal 1 (Week 2): patient will increase ability to complete LB dressing CGA utilizing lateral leans on EOB OT Short Term Goal 2 (Week 2): patient will complete toilet transfers to [facility] or toilet utilizing slide board with CGA  Skilled Therapeutic Interventions/Progress Updates:  Patient agreeable to participate in OT session. Reports 9/10 pain level in the R shoulder.   Patient participated in skilled OT session focusing on transfer training, safety education, demonstration of home safety, and discharge planning patient able to complete transfers with sup A. Patient reports sup A for all ADLs. Therapist and patient completed education for shower transfer and safety . Patient educated on energy conservation techniques. Patient completed wheelchair mobility with no pain and good functional activity tolerance. Patient stated no new concerns for home discharge.    Therapy Documentation Precautions:  Precautions Precautions: Fall, Other (comment) Recall of Precautions/Restrictions: Intact Precaution/Restrictions Comments: LLE would vac, L limb protector Restrictions Weight Bearing Restrictions Per Provider Order: Yes LLE Weight Bearing Per Provider Order: Non weight bearing    Therapy/Group: Individual Therapy  D'mariea L Deandrea Rion OTR/L  01/08/2024, 8:22 AM

## 2024-01-08 NOTE — Telephone Encounter (Signed)
 Dan Rehab from [facility] called and would like for Dr. Harden to go see patient. States she is having drainage and swelling. States he sent pictures to Dr. Harden via Mychart/Epic.  S/P L BKA-12/18/2023  Room 4W10 [facility]   CB [phone]

## 2024-01-08 NOTE — Progress Notes (Signed)
 Patient ID: Dana [name], female   DOB: 1960/10/07, 63 y.o.   MRN: [id number]  Met with pt and husband who is here to discuss team conference progress this week and MD contacted to come look at her wounds and leg-Dr Harden. They have contacted [employer] to pay the co-pay and delivery will happen for the bed tomorrow and wheelchair and transfer board today or tomorrow am. Pt will have husband, daughter and friend to assist her. She hopes to go home tomorrow but understands if medical issues will prevent this. Continue to work on discharge needs.

## 2024-01-08 NOTE — Progress Notes (Addendum)
 PROGRESS NOTE   Subjective/Complaints: Continues to have R shoulder pain.  L leg pain is controlled.  Reports she had a R shoulder fracture a few years ago.  ROS: Patient denies fever, chills, HA, SOB, CP, abdominal pain, N/V/D + Rash improved + Tremor- continued + dysuria- improving + R shoulder pain- continued  Objective:   DG Shoulder Right Result Date: 01/07/2024 CLINICAL DATA:  Right shoulder pain for 2 weeks without known injury. EXAM: RIGHT SHOULDER - 2+ VIEW COMPARISON:  August 27, 2023. FINDINGS: Deformity of proximal right humeral head and neck is noted consistent with old fracture. No acute fracture or dislocation is noted. IMPRESSION: Probable old proximal right humeral head and neck fracture. No acute abnormality seen. Electronically Signed   By: Lynwood Landy Raddle M.D.   On: 01/07/2024 16:52     Recent Labs    01/06/24 0513  WBC 8.5  HGB 7.1*  HCT 23.2*  PLT 246   Recent Labs    01/07/24 0500 01/08/24 0455  NA 133* 134*  K 5.0 4.6  CL 102 101  CO2 21* 22  GLUCOSE 119* 144*  BUN 60* 56*  CREATININE 3.27* 3.33*  CALCIUM  8.4* 8.4*    Intake/Output Summary (Last 24 hours) at 01/08/2024 1216 Last data filed at 01/08/2024 1015 Gross per 24 hour  Intake 358 ml  Output 250 ml  Net 108 ml         Physical Exam: Vital Signs Blood pressure (!) 116/51, pulse 65, temperature 98.7 F (37.1 C), temperature source Oral, resp. rate 19, height 5' 5 (1.651 m), weight 98.8 kg, SpO2 92%.  Constitutional: No distress . Vital signs reviewed.  Sitting in WC, appears comfortable HEENT: NCAT, EOMI, oral membranes moist Neck: supple Cardiovascular: RRR without murmur. No JVD   , trace RLE edema Respiratory/Chest: CTA Bilaterally without wheezes or rales. Normal effort    GI/Abdomen: BS +, sl tender, non-distended, bruising Psych: pleasant and cooperative  Skin:  Incision well-approximated, no areas of active  drainage or dehiscence.  Mild amount of edema and errythema around incision but not warm. Pleas see images.  Several small abrasions on L leg covered by border foam dressings.   Rash continues to improve  Neuro:  Alert and oriented x 3. Normal insight and awareness. Intact Memory. Normal language and speech. Cranial nerve exam unremarkable. MMT: BUE 4+ to 5/5. LLE limited pain, at least 3 to 3+/5. RLE 4 to 4+/5. Sensory exam normal for light touch and pain in all 4 limbs. No limb ataxia or cerebellar signs. No abnormal tone appreciated.  . Mild b/l UE intention tremor  Prior neuro assessment is c/w today's exam 01/08/2024.   Musculoskeletal: LBKA Shrinker in place--well-formed.  R shoulder pain with abduction/forward flexion. Pain at  [facility] joint, superior and lateral shoulder, SS. Neers test positive. Pain with internal and external rotation.   Assessment/Plan: 1. Functional deficits which require 3+ hours per day of interdisciplinary therapy in a comprehensive inpatient rehab setting. Physiatrist is providing close team supervision and 24 hour management of active medical problems listed below. Physiatrist and rehab team continue to assess barriers to discharge/monitor patient progress toward functional and medical goals  Care  Tool:  Bathing    Body parts bathed by patient: Right arm, Left upper leg, Left arm, Right lower leg, Chest, Abdomen, Front perineal area, Face, Left lower leg, Buttocks, Right upper leg (sititng in chair/ EOB. sponge bath)   Body parts bathed by helper: Front perineal area, Buttocks, Right lower leg Body parts n/a: Left lower leg   Bathing assist Assist Level: Contact Guard/Touching assist     Upper Body Dressing/Undressing Upper body dressing   What is the patient wearing?: Pull over shirt    Upper body assist Assist Level: Set up assist    Lower Body Dressing/Undressing Lower body dressing      What is the patient wearing?: Pants, Orthosis, Ace  wrap/stump shrinker     Lower body assist Assist for lower body dressing: Minimal Assistance - Patient > 75%     Toileting Toileting    Toileting assist Assist for toileting: Minimal Assistance - Patient > 75%     Transfers Chair/bed transfer  Transfers assist  Chair/bed transfer activity did not occur: Safety/medical concerns  Chair/bed transfer assist level: Contact Guard/Touching assist     Locomotion Ambulation   Ambulation assist   Ambulation activity did not occur: Safety/medical concerns (2/2 endurance deficit and WB restrictions)          Walk 10 feet activity   Assist  Walk 10 feet activity did not occur: Safety/medical concerns (2/2 endurance deficit and WB restrictions)        Walk 50 feet activity   Assist Walk 50 feet with 2 turns activity did not occur: Safety/medical concerns (2/2 endurance deficit and WB restrictions)         Walk 150 feet activity   Assist Walk 150 feet activity did not occur: Safety/medical concerns (2/2 endurance deficit and WB restrictions)         Walk 10 feet on uneven surface  activity   Assist Walk 10 feet on uneven surfaces activity did not occur: Safety/medical concerns (2/2 endurance deficit and WB restrictions)         Wheelchair     Assist Is the patient using a wheelchair?: Yes Type of Wheelchair: Manual    Wheelchair assist level: Supervision/Verbal cueing Max wheelchair distance: 100    Wheelchair 50 feet with 2 turns activity    Assist        Assist Level: Supervision/Verbal cueing   Wheelchair 150 feet activity     Assist      Assist Level: Moderate Assistance - Patient 50 - 74%   Blood pressure (!) 116/51, pulse 65, temperature 98.7 F (37.1 C), temperature source Oral, resp. rate 19, height 5' 5 (1.651 m), weight 98.8 kg, SpO2 92%.  Medical Problem List and Plan: 1. Functional deficits secondary to left BKA 12/18/2023 after postoperative left ankle  infection.                -patient may shower if leg is covered             -ELOS/Goals: 7-10 days, mod I w/c level, supervision ambulatory level  -Continue CIR therapies including PT, OT   - Expected discharge 6/26  -Team conference today please see physician documentation under team conference tab, met with team  to discuss problems,progress, and goals. Formulized individual treatment plan based on medical history, underlying problem and comorbidities.   -Dr. Harden contacted about erythema around incision site, she is currently on antibiotic for UTI   2.  Antithrombotics: -DVT/anticoagulation:  Pharmaceutical: *dc'ed sq heparin   d/t bleeding from sq inj sites  -SCD RLE--DC'd due to bruising/rash, appears stable             -antiplatelet therapy: Aspirin  81 mg daily, Brilinta  90 mg twice daily- - 6/21-22: ongoing hematuria and uptrending BUN to creatinine ratio concerning for bleeding on DAPT.  Hemoglobin has remained stable--defer adjustments to primary given high risk  6/21: Right lower extremity duplex negative  3. Pain Management: Neurontin  300 mg 3 times daily, oxycodone  as needed             -pt reports her worst pain is from her low back and related left lumbar radiculopathy. No phantom pain currently                         -continue above meds for now. Encouraged oob                           -therapy can work on ROM, posture, etc.  4. Mood/Behavior/Sleep/hallucinations: Lexapro  10 mg daily, BuSpar  15 mg 3 times daily             -mood is positive             -antipsychotic agents: N/A  6-21: DC Lexapro  due to hyponatremia. Add melatonin for sleep 5 mg PRN.  Patient does not want to have your sleep medications due to prior poor experiences.  No evidence of active hallucinations on exam.  6/22: No endorsed hallucinations, delusions at this time.  5. Neuropsych/cognition: This patient is capable of making decisions on her own behalf. 6.  Wound Care/ID:  daptomycin  through 12/28/23 for  MRSA bacteremia             -MRSA contact precautions             -pt was changed to traditional vac dressing  6/12 as prevena was not holding suction.  6/15-Still no drainage in cannister. Dressing is due for a change Monday.   **Consider removing Vac Monday if no further drainage in cannister.   -Dr. Harden note indicated plan for 1 week wound vac after DC  -6/19 DC wound VAC today  6/22-23: Wound looks excellent; continue shrinker sock and open to air  6/24 new limb protector ordered, prior was too small, think this may have contributed to some of the abrasions on her leg  6/25 new limb protector appears to be fitting better  7. Fluids/Electrolytes/Nutrition: Routine in and outs with follow-up chemistries 8.  Acute on chronic anemia.   Hgb with drop today to 7.3. minimal sx             -transfused 1u PRBC 6/13             -recheck hgb up to 8.1             -continue Brilinta  for now  -holding heparin  as above  -fe++ supp  -6/16 HGB 7.4, continue to monitor  -6/19  HGB stable 7.6, continue to monitor   6-22: Hemoglobin stable 7.5, see #2 above  6/23 HGB 7.1, nephrology gave ESA, can consider transfusion if goes any lower  Recheck today CBC   9.  Diabetes mellitus with peripheral neuropathy.  Hemoglobin A1c 10.2.  NovoLog  3 units 3 times daily, Semglee  5 units daily.  Check blood sugars AC and at bedtime  - Blood sugars well-controlled Recent Labs    01/07/24 2133 01/08/24 0545 01/08/24 1134  GLUCAP 179* 144* 147*    Continue current regimen  10.  CAD/stenting/angioplasty as well as history of mitral valve replacement.  Continue aspirin  and Brilinta .  Follow-up cardiology services 11.  Diastolic congestive heart failure.  Coreg  9.375 mg twice daily, Demadex  60 mg daily (hold).  Monitor for any signs of fluid overload             -need weights-stable  - 6/17 Coreg  held by nephrology  -6/18-20 stable continue to monitor NO signs of overload  01-03-21: Stable weights, stable  appearance.  Nephrology discontinued midodrine .  6/23 weight  higher, suspect inaccurate reading  6/25 nephrology restarted torsemide  at 20mg  daily   Filed Weights   01/05/24 0539 01/06/24 0300 01/07/24 0534  Weight: 95.5 kg 102.6 kg 98.8 kg   t 12.  GERD.  Protonix  13.  Hyperlipidemia.  Zetia  14.  Chronic hypoxic respiratory failure/OSA.  Continue CPAP  15.  CKD stage IV.  Baseline 2.2-2.4.               6/14 -Cr bumped  to 2.7 and sl to 2.81 today              -may be related to anemia              -continue to hold demadex  6/15- Cr continues to trend up-- 3.22  -still think this is due to demadex  she has received up until Friday  - push fluids and recheck BMET in am  -if further increase tomorrow will need to contact nephrology 6/16 Nephrology consulted Cr a little higher today 6/17 nephrologist started midodrine  Coreg  held -Foley removed for UA and culture to be completed 6/19 discussed with nephrology regarding pyuria, since Foley just removed and no symptoms continue to monitor for now and will plan to repeat UA in several days. 6/20 BP has been stable, appears midodrine  has been discontinued, creatinine very slowly improving now at 3.54 6-21: Creatinine downtrending, 3.36 today.  BUN stable 64.  Worsening hyponatremia at 132; labs to evaluate 6-22: Appreciate nephrology recommendations; recommending IgA considering fair amount of hematuria/proteinuria, however likely etiology diabetic kidney disease plus Brilinta  and ?UTI. 6/23 BUN and creatinine improved to 64/3.18, continue to monitor trend -6/25 Cr slightly up at 3.33 today, will contact nephrology about possible DC tomorrow      Latest Ref Rng & Units 01/08/2024    4:55 AM 01/07/2024    5:00 AM 01/06/2024    5:13 AM  BMP  Glucose 70 - 99 mg/dL 855  880  874   BUN 6 - 20 mg/dL 56  60  64   Creatinine 0.44 - 1.00 mg/dL 6.66  6.72  6.81   Sodium 135 - 145 mmol/L 134  133  131   Potassium 3.5 - 5.1 mmol/L 4.6  5.0  4.6    Chloride 98 - 111 mmol/L 101  102  101   CO2 22 - 32 mmol/L 22  21  24    Calcium  8.9 - 10.3 mg/dL 8.4  8.4  8.0         3/74/7974    5:46 AM 01/07/2024    9:14 PM 01/07/2024    3:26 PM  Vitals with BMI  Systolic 116 141 847  Diastolic 51 56 58  Pulse 65 65 69    16.  History of gout.  Allopurinol .  Monitor for any gout flares 17. R leg rash  - Hydrocortisone  cream started.  - Advised her not to use the compression  stocking device she was using previously.  Neurology considering IgA -6/20 improving, continue current regimen 6-22: diffuse/bruising pattern, duplex negative for DVT but noticed a lot of interstitial edema.  Rash is nontender, not itchy, and localized.  Agree likely localized trauma due to SCD and DAPT.  No changes noted over this weekend. 6/25 improving, continue to monitor   18. Urinary retention  -Foley removed, monitor voiding trial - 6/18 will message nursing to ensure regular PVRs/Bladder scans, will also ask about U/A- dont see done yet  6/19 patient did not have elevated bladder scans yesterday, has been continent  6/20 PVR 0 this AM, continent  6/21: No PVRs.  Continent of urine.  -Will do purewick at night   19. Occasional tremor  -Renal related? Continue to monitor .   6/19 discussed with nephrology not felt to be uremic related.  Possibly due to discontinuation of beta-blocker  6/20 hopefully will improve when beta-blocker reinitiated  20. Dysuria, likely UTI  -Will send urine sample for cx and plan to start abx after this  -Keflex  started 250mg  Q24h, monitor culture- Addendum- DC renal started cipro   6/21: Urinalysis with large blood, rare bacteria, no leukocyte esterase or nitrates.  DC Cipro . Patient Cr too high for analgesic.   6/22: Cipro  resumed by nephrology, urine cultures with less than 10K.  Will continue now as is nephrology's preference but defer to primary team ongoing necessity--did discuss this with patient.  6/24 pharmacy asks about  changing to keflex  due to culture results- ok to change  Symptoms improved  21.  Hyponatremia.  Downtrending since admission, at 132 today.  - DC Lexapro   - Placed 1200 cc fluid restriction  - Urine studies ordered for today to evaluate; not on any diuretics  6/22: Slightly improved today, 134.  Labs appear borderline normal, slightly hypertonic, liberalize fluids and encourage p.o.'s today.  6/24 Na 133, stable,  continue to monitor   22. R shoulder pain  -Xray ordered- prior fracture, no acute changes. likely OA vs Rotator cuff aggravated by UE activity, lidocaine  patch  -Likely aggravated by therapy, consider MRI if does not improve outpatient  LOS: 12 days A FACE TO FACE EVALUATION WAS PERFORMED  Murray Collier 01/08/2024, 12:16 PM

## 2024-01-09 ENCOUNTER — Other Ambulatory Visit (HOSPITAL_COMMUNITY): Payer: Self-pay

## 2024-01-09 ENCOUNTER — Encounter (HOSPITAL_COMMUNITY)

## 2024-01-09 DIAGNOSIS — E1169 Type 2 diabetes mellitus with other specified complication: Secondary | ICD-10-CM

## 2024-01-09 LAB — RENAL FUNCTION PANEL
Albumin: 2.2 g/dL — ABNORMAL LOW (ref 3.5–5.0)
Anion gap: 7 (ref 5–15)
BUN: 52 mg/dL — ABNORMAL HIGH (ref 6–20)
CO2: 25 mmol/L (ref 22–32)
Calcium: 8.3 mg/dL — ABNORMAL LOW (ref 8.9–10.3)
Chloride: 103 mmol/L (ref 98–111)
Creatinine, Ser: 3.25 mg/dL — ABNORMAL HIGH (ref 0.44–1.00)
GFR, Estimated: 16 mL/min — ABNORMAL LOW (ref >60)
Glucose, Bld: 96 mg/dL (ref 70–99)
Phosphorus: 4.6 mg/dL (ref 2.5–4.6)
Potassium: 4.7 mmol/L (ref 3.5–5.1)
Sodium: 135 mmol/L (ref 135–145)

## 2024-01-09 LAB — CBC
HCT: 23.4 % — ABNORMAL LOW (ref 36.0–46.0)
Hemoglobin: 7.3 g/dL — ABNORMAL LOW (ref 12.0–15.0)
MCH: 28.1 pg (ref 26.0–34.0)
MCHC: 31.2 g/dL (ref 30.0–36.0)
MCV: 90 fL (ref 80.0–100.0)
Platelets: 265 10*3/uL (ref 150–400)
RBC: 2.6 MIL/uL — ABNORMAL LOW (ref 3.87–5.11)
RDW: 17.6 % — ABNORMAL HIGH (ref 11.5–15.5)
WBC: 8.4 10*3/uL (ref 4.0–10.5)
nRBC: 0 % (ref 0.0–0.2)

## 2024-01-09 LAB — GLUCOSE, CAPILLARY
Glucose-Capillary: 96 mg/dL (ref 70–99)
Glucose-Capillary: 125 mg/dL — ABNORMAL HIGH (ref 70–99)

## 2024-01-09 MED ORDER — TORSEMIDE 20 MG PO TABS
20.0000 mg | ORAL_TABLET | Freq: Every day | ORAL | 0 refills | Status: DC
  Filled 2024-01-09: qty 30, 30d supply, fill #0

## 2024-01-09 MED ORDER — POTASSIUM CHLORIDE CRYS ER 20 MEQ PO TBCR
20.0000 meq | EXTENDED_RELEASE_TABLET | Freq: Every day | ORAL | 0 refills | Status: DC
  Filled 2024-01-09: qty 30, 30d supply, fill #0

## 2024-01-09 NOTE — Plan of Care (Signed)
  Problem: RH Grooming Goal: LTG Patient will perform grooming w/assist,cues/equip (OT) Description: LTG: Patient will perform grooming with assist, with/without cues using equipment (OT) Outcome: Completed/Met   Problem: RH Bathing Goal: LTG Patient will bathe all body parts with assist levels (OT) Description: LTG: Patient will bathe all body parts with assist levels (OT) Outcome: Completed/Met   Problem: RH Dressing Goal: LTG Patient will perform lower body dressing w/assist (OT) Description: LTG: Patient will perform lower body dressing with assist, with/without cues in positioning using equipment (OT) Outcome: Completed/Met   Problem: RH Toileting Goal: LTG Patient will perform toileting task (3/3 steps) with assistance level (OT) Description: LTG: Patient will perform toileting task (3/3 steps) with assistance level (OT)  Outcome: Completed/Met   Problem: RH Toilet Transfers Goal: LTG Patient will perform toilet transfers w/assist (OT) Description: LTG: Patient will perform toilet transfers with assist, with/without cues using equipment (OT) Outcome: Completed/Met

## 2024-01-09 NOTE — Patient Care Conference (Signed)
 Inpatient RehabilitationTeam Conference and Plan of Care Update Date: 01/08/2024   Time: 12:16 PM    Patient Name: Dana [name]      Medical Record Number: [id number]  Date of Birth: 1960/07/28 Sex: Female         Room/Bed: 4W10C/4W10C-01 Payor Info: Payor: [employer] / Plan: [employer] PPO / Product Type: *No Product type* /    Admit Date/Time:  12/27/2023  5:18 PM  Primary Diagnosis:  Left below-knee amputee [facility])  Hospital Problems: Principal Problem:   Left below-knee amputee [facility]) Active Problems:   Moderate episode of recurrent major depressive disorder (HCC)   ABLA (acute blood loss anemia)    Expected Discharge Date: Expected Discharge Date: 01/09/24  Team Members Present: Physician leading conference: Dr. Murray Collier Social Worker Present: Rhoda Clement, LCSW Nurse Present: Barnie Ronde, RN PT Present: Doreene Orris, PT OT Present: Delon Sharps, OT     Current Status/Progress Goal Weekly Team Focus  Bowel/Bladder   pt is continent of b/b   remain continent   assist with toileting qshift and prn    Swallow/Nutrition/ Hydration               ADL's   touch for LB, CGA for board set up, sup for ADLS   CGA-sup   slide board transfers, activity tolerance, UE strengthening to decrease UE pain.    Mobility   downgraded goals to supervision/min A on 6/22, pt at goal level.   supervision WC level. min A standing.  D/C 6/26; barriers to discharge: awaiting DME to be delivered, multiple wounds and redness on L LE residual limb.    Communication                Safety/Cognition/ Behavioral Observations               Pain   pt c/o right shoulder pain 10/10   <3 pain score   Assess qshift and prn    Skin   skin intact, sutures to left bka  Reddened areas on residual limb Maintain skin integrity and promote healing  Assess qshift and prn  Dr. Harden consulted on incision/residual limb with redness and edema    Discharge  Planning:  Husband was here last Sunday for family training and aware of pt's care needs. Will have mom, daughter and friend to help while husband works. HH in place and DME ordered.   Team Discussion: Patient post left BKA with wound vac and MRSA contact precautions. Note tremors; question if related to AKI/CKD. Progress limited by fear of falling. Rash on non affected limb improved and Abx added for residual limb wounds. MD monitoring Hgb level.  Patient on target to meet rehab goals: yes, currently needs supervision for ADLs with cues for safety for slide-board transfers.   *See Care Plan and progress notes for long and short-term goals.   Revisions to Treatment Plan:  UTI treated Nephrology following/monitoring labs X-ray shoulder; f/u on pain and prior shoulder fracture Limb protector adjustment Surgeon consult for wound care/edema management  Teaching Needs: Safety, medications, transfers, toileting, skin care, residual limb care/protection, etc.  Current Barriers to Discharge: Home enviroment access/layout, Lack of/limited family support, and Weight bearing restrictions  Possible Resolutions to Barriers: Family education; video-taped educational on phone HH follow up services DME: W/C, hospital bed, drop arm BSC, TTB and RW     Medical Summary Current Status: L BKA, anemia, CKD, Rash, R shoulder pain, UTI  Barriers to Discharge: Complicated Wound;Medical  stability;Renal Insufficiency/Failure;Self-care education;Uncontrolled Pain  Barriers to Discharge Comments: L BKA, anemia, CKD, Rash, R shoulder pain, UTI Possible Resolutions to [employer] Focus: Pain medications, check with Dr. Harden regarding incision, f/u with nephrology regarding plan, continue abx   Continued Need for Acute Rehabilitation Level of Care: The patient requires daily medical management by a physician with specialized training in physical medicine and rehabilitation for the following reasons: Direction  of a multidisciplinary physical rehabilitation program to maximize functional independence : Yes Medical management of patient stability for increased activity during participation in an intensive rehabilitation regime.: Yes Analysis of laboratory values and/or radiology reports with any subsequent need for medication adjustment and/or medical intervention. : Yes   I attest that I was present, lead the team conference, and concur with the assessment and plan of the team.   Fredericka Barnie NOVAK 01/09/2024, 12:35 PM

## 2024-01-09 NOTE — Plan of Care (Signed)
  Problem: Sit to Stand Goal: LTG:  Patient will perform sit to stand with assistance level (PT) Description: LTG:  Patient will perform sit to stand with assistance level (PT) Outcome: Not Met (add Reason) Flowsheets (Taken 01/09/2024 1621) LTG: PT will perform sit to stand in preparation for functional mobility with assistance level: (Pt requires elevated surface and mod-max A for standing 2/2 overall weakness/debility) --   Problem: RH Bed Mobility Goal: LTG Patient will perform bed mobility with assist (PT) Description: LTG: Patient will perform bed mobility with assistance, with/without cues (PT). Outcome: Completed/Met   Problem: RH Bed to Chair Transfers Goal: LTG Patient will perform bed/chair transfers w/assist (PT) Description: LTG: Patient will perform bed to chair transfers with assistance (PT). Outcome: Completed/Met   Problem: RH Car Transfers Goal: LTG Patient will perform car transfers with assist (PT) Description: LTG: Patient will perform car transfers with assistance (PT). Outcome: Completed/Met   Problem: RH Wheelchair Mobility Goal: LTG Patient will propel w/c in home environment (PT) Description: LTG: Patient will propel wheelchair in home environment, # of feet with assistance (PT). Outcome: Completed/Met

## 2024-01-09 NOTE — Progress Notes (Signed)
 Inpatient Rehabilitation Care Coordinator Discharge Note   Patient Details  Name: Dana [name] MRN: [id number] Date of Birth: 12/26/1960   Discharge location: HOME WITH HUSBAND AND MOM  Length of Stay: 13 DAYS  Discharge activity level: SUPERVISION WC LEVEL  Home/community participation: ACTIVE  Patient response un:Yzjouy Literacy - How often do you need to have someone help you when you read instructions, pamphlets, or other written material from your doctor or pharmacy?: Never  Patient response un:Dnrpjo Isolation - How often do you feel lonely or isolated from those around you?: Never  Services provided included: MD, RD, PT, OT, RN, CM, TR, Pharmacy, Neuropsych, SW  Financial Services:  Field seismologist Utilized: Media planner [employer]  Choices offered to/list presented to: PT AND HUSBAND  Follow-up services arranged:  Home Health, DME, Patient/Family request agency HH/DME Home Health Agency: [facility]  PT  OT  RN    DME : [facility]  [facility], HOSPITAL BED TRANSFER BOARD.  GOT WIDE DROP-ARM BEDSIDE COMMODE ON OWN HH/DME Requested Agency: [facility]  Patient response to transportation need: Is the patient able to respond to transportation needs?: Yes In the past 12 months, has lack of transportation kept you from medical appointments or from getting medications?: No In the past 12 months, has lack of transportation kept you from meetings, work, or from getting things needed for daily living?: No   Patient/Family verbalized understanding of follow-up arrangements:  Yes  Individual responsible for coordination of the follow-up plan: SELF AND [name] [phone]  Confirmed correct DME delivered: Dana [name] Dana [name] 01/09/2024    Comments (or additional information):BETWEEN HUSBAND, MOM DAUGHTER AND FRIEND CAN PROVIDE 24/7 SUPERVISION. HUSBAND WORKS AND MOM IS ELDERLY. HUSBAND WAS IN FOR HANDS ON EDUCATION AND ALSO VIDEO HER  SESSION FOR OTHER FAMILY MEMBERS  Summary of Stay    Date/Time Discharge Planning CSW  01/08/24 0954 Husband was here last Sunday for family training and aware of pt's care needs. Will have mom, daughter and friend to help while husband works. HH in place and DME ordered. RGD  01/01/24 9095 HOme with husband who works and elderly mom whom lives with them. She can only provide supervision level. RGD       Dana [name]

## 2024-01-09 NOTE — Progress Notes (Signed)
 S: Feels well and anxious to go home O:BP (!) 128/49 (BP Location: Left Arm)   Pulse 67   Temp 97.7 F (36.5 C) (Oral)   Resp 16   Ht 5' 5 (1.651 m)   Wt 98.6 kg   SpO2 98%   BMI 36.17 kg/m   Intake/Output Summary (Last 24 hours) at 01/09/2024 0951 Last data filed at 01/09/2024 0721 Gross per 24 hour  Intake 960 ml  Output 490 ml  Net 470 ml   Intake/Output: I/O last 3 completed shifts: In: 720 [P.O.:720] Out: 490 [Urine:490]  Intake/Output this shift:  Total I/O In: 480 [P.O.:480] Out: -  Weight change:  Gen: NAD CVS: RRR Resp:CTA Abd: +BS, soft, NT/ND Ext: s/p LBKA  Recent Labs  Lab 01/03/24 0622 01/04/24 0718 01/05/24 0610 01/06/24 0513 01/07/24 0500 01/08/24 0455 01/09/24 0502  NA 134* 132* 134* 131* 133* 134* 135  K 4.6 3.9 4.8 4.6 5.0 4.6 4.7  CL 96* 99 99 101 102 101 103  CO2 21* 25 25 24  21* 22 25  GLUCOSE 109* 103* 123* 125* 119* 144* 96  BUN 64* 64* 66* 64* 60* 56* 52*  CREATININE 3.54* 3.36* 3.29* 3.18* 3.27* 3.33* 3.25*  ALBUMIN  2.3* 2.2*  --   --  2.2* 2.1* 2.2*  CALCIUM  8.6* 8.3* 8.4* 8.0* 8.4* 8.4* 8.3*  PHOS 4.9* 4.9*  --   --  4.2 4.3 4.6   Liver Function Tests: Recent Labs  Lab 01/07/24 0500 01/08/24 0455 01/09/24 0502  ALBUMIN  2.2* 2.1* 2.2*   No results for input(s): LIPASE, AMYLASE in the last 168 hours. No results for input(s): AMMONIA in the last 168 hours. CBC: Recent Labs  Lab 01/05/24 0610 01/06/24 0513 01/08/24 1529 01/09/24 0505  WBC 8.9 8.5 10.1 8.4  NEUTROABS 6.4  --   --   --   HGB 7.5* 7.1* 8.2* 7.3*  HCT 24.2* 23.2* 26.4* 23.4*  MCV 89.3 89.9 90.1 90.0  PLT 272 246 301 265   Cardiac Enzymes: No results for input(s): CKTOTAL, CKMB, CKMBINDEX, TROPONINI in the last 168 hours. CBG: Recent Labs  Lab 01/08/24 0545 01/08/24 1134 01/08/24 1632 01/08/24 2143 01/09/24 0623  GLUCAP 144* 147* 124* 110* 96    Iron  Studies: No results for input(s): IRON , TIBC, TRANSFERRIN, FERRITIN in  the last 72 hours. Studies/Results: DG Shoulder Right Result Date: 01/07/2024 CLINICAL DATA:  Right shoulder pain for 2 weeks without known injury. EXAM: RIGHT SHOULDER - 2+ VIEW COMPARISON:  August 27, 2023. FINDINGS: Deformity of proximal right humeral head and neck is noted consistent with old fracture. No acute fracture or dislocation is noted. IMPRESSION: Probable old proximal right humeral head and neck fracture. No acute abnormality seen. Electronically Signed   By: Lynwood Landy Raddle M.D.   On: 01/07/2024 16:52    allopurinol   50 mg Oral Daily   aspirin  EC  81 mg Oral Daily   busPIRone   15 mg Oral TID   cephALEXin   500 mg Oral Q8H   Chlorhexidine  Gluconate Cloth  6 each Topical Q12H   darbepoetin (ARANESP ) injection - NON-DIALYSIS  200 mcg Subcutaneous Q Sat-1800   diclofenac Sodium  2 g Topical QID   ezetimibe   10 mg Oral Daily   gabapentin   300 mg Oral TID   hydrocortisone  cream   Topical TID   insulin  aspart  0-6 Units Subcutaneous TID AC & HS   insulin  aspart  3 Units Subcutaneous TID WC   insulin  glargine-yfgn  5 Units Subcutaneous  Daily   latanoprost   1 drop Both Eyes QHS   lidocaine   1 patch Transdermal Q24H   pantoprazole   40 mg Oral Daily   polyethylene glycol  17 g Oral Daily   senna  2 tablet Oral QHS   ticagrelor   90 mg Oral BID   torsemide   20 mg Oral Daily    BMET    Component Value Date/Time   NA 135 01/09/2024 0502   NA 143 07/29/2023 1138   K 4.7 01/09/2024 0502   CL 103 01/09/2024 0502   CO2 25 01/09/2024 0502   GLUCOSE 96 01/09/2024 0502   BUN 52 (H) 01/09/2024 0502   BUN 57 (H) 07/29/2023 1138   CREATININE 3.25 (H) 01/09/2024 0502   CREATININE 1.88 (H) 07/19/2022 1005   CALCIUM  8.3 (L) 01/09/2024 0502   CALCIUM  9.3 10/04/2023 1414   GFRNONAA 16 (L) 01/09/2024 0502   GFRNONAA 30 (L) 07/19/2022 1005   GFRAA 43 (L) 06/28/2020 1003   CBC    Component Value Date/Time   WBC 8.4 01/09/2024 0505   RBC 2.60 (L) 01/09/2024 0505   HGB 7.3 (L)  01/09/2024 0505   HGB 10.0 (L) 07/29/2023 1138   HCT 23.4 (L) 01/09/2024 0505   HCT 32.6 (L) 07/29/2023 1138   PLT 265 01/09/2024 0505   PLT 309 07/29/2023 1138   MCV 90.0 01/09/2024 0505   MCV 89 07/29/2023 1138   MCV 84 04/26/2022 0000   MCH 28.1 01/09/2024 0505   MCHC 31.2 01/09/2024 0505   RDW 17.6 (H) 01/09/2024 0505   RDW 14.5 07/29/2023 1138   LYMPHSABS 1.3 01/05/2024 0610   LYMPHSABS 1.8 07/12/2023 1105   MONOABS 0.7 01/05/2024 0610   EOSABS 0.4 01/05/2024 0610   EOSABS 0.3 07/12/2023 1105   BASOSABS 0.1 01/05/2024 0610   BASOSABS 0.1 07/12/2023 1105    Assessment/Recommendations: Dana [name] is a/an 63 y.o. female with a past medical history significant for DM1, CAD, GERD, CHF, COPD, history of cardiac arrest who present w/ gangrenous foot s/p amputation c/b AKI    Nonoliguric AKI on CKD 4: Baseline creatinine 2.2-2.7.  Creatinine got up to 3.7 but slolwy improving to 3.18- 3.25 today.  Possibly was from low blood pressures - Workup overall benign.  Creatinine starting to improve-  also with clinical evidence of UTI-  started cipro  -Blood pressure now higher.  Stopped midodrine  -Resumed torsemide  but at 20 mg daily (was on 60 mg at home)    - diabetic kidney disease seems the most likely plus brilinta  and UTI.   -Continue to monitor for low blood pressures -Encourage p.o. intake   -Monitor Daily I/Os, Daily weight  -Maintain MAP>65 for optimal renal perfusion. -   crt a little stalled today    CHF: Volume status appears stable.  No concerns. Holding coreg  for now.  Torsemide  as above.   Uncontrolled DM1 with hyperglycemia: Management per primary team   Left BKA: Continue with rehab per primary team   Anemia: Likely multifactorial.  Iron  somewhat low but has allergy .  Would recommend a transfusion if Hgb continues to drop.-  gave dose of ESA as well   UTI-  symptomatic-  will start cipro  while culture is cooking -   would be my preference to continue cipro  given  that she was symptomatic and had pyuria    Hyponartremia-  resolved with improved renal function.  Disposition - stable for discharge from renal standpoint.  She should f/u with [facility]/Dr. Prescilla in 2-3 weeks  [name], MD [facility]

## 2024-01-09 NOTE — Progress Notes (Signed)
 PROGRESS NOTE   Subjective/Complaints: Reported going home today.  No new concerns or complaints.  ROS: Patient denies fever, chills, HA, SOB, CP, abdominal pain, N/V/D/C + Rash improving + Tremor- continued + dysuria- improving + R shoulder pain- continued  Objective:   DG Shoulder Right Result Date: 01/07/2024 CLINICAL DATA:  Right shoulder pain for 2 weeks without known injury. EXAM: RIGHT SHOULDER - 2+ VIEW COMPARISON:  August 27, 2023. FINDINGS: Deformity of proximal right humeral head and neck is noted consistent with old fracture. No acute fracture or dislocation is noted. IMPRESSION: Probable old proximal right humeral head and neck fracture. No acute abnormality seen. Electronically Signed   By: Lynwood Landy Raddle M.D.   On: 01/07/2024 16:52     Recent Labs    01/08/24 1529 01/09/24 0505  WBC 10.1 8.4  HGB 8.2* 7.3*  HCT 26.4* 23.4*  PLT 301 265   Recent Labs    01/08/24 0455 01/09/24 0502  NA 134* 135  K 4.6 4.7  CL 101 103  CO2 22 25  GLUCOSE 144* 96  BUN 56* 52*  CREATININE 3.33* 3.25*  CALCIUM  8.4* 8.3*    Intake/Output Summary (Last 24 hours) at 01/09/2024 1516 Last data filed at 01/09/2024 9278 Gross per 24 hour  Intake 720 ml  Output --  Net 720 ml         Physical Exam: Vital Signs Blood pressure (!) 128/49, pulse 67, temperature 97.7 F (36.5 C), temperature source Oral, resp. rate 16, height 5' 5 (1.651 m), weight 98.6 kg, SpO2 98%.  Constitutional: No distress . Vital signs reviewed.  Sitting in WC, appears comfortable HEENT: NCAT, EOMI, oral membranes moist Neck: supple Cardiovascular: RRR without murmur. No JVD   , trace RLE edema Respiratory/Chest: CTA Bilaterally without wheezes or rales. Normal effort    GI/Abdomen: BS +, sl tender, non-distended, bruising Psych: pleasant and cooperative  Skin:  Incision well-approximated, no areas of active drainage or dehiscence.  Mild  amount of edema and errythema around incision but not warm. No drainage noted.   Several small abrasions on L leg covered by border foam dressings.   Rash on RLE improving  Neuro:  Alert and oriented x 3. Normal insight and awareness. Intact Memory. Normal language and speech. Cranial nerve exam unremarkable. MMT: BUE 4+ to 5/5. LLE limited pain, at least 3 to 3+/5. RLE 4 to 4+/5. Sensory exam normal for light touch and pain in all 4 limbs. No limb ataxia or cerebellar signs. No abnormal tone appreciated.  . Mild b/l UE intention tremor  Prior neuro assessment is c/w today's exam 01/09/2024.   Musculoskeletal: LBKA Shrinker in place--well-formed.  R shoulder pain with abduction/forward flexion. Pain at [facility] joint, superior and lateral shoulder, SS. Neers test positive. Pain with internal and external rotation.   Assessment/Plan: 1. Functional deficits which require 3+ hours per day of interdisciplinary therapy in a comprehensive inpatient rehab setting. Physiatrist is providing close team supervision and 24 hour management of active medical problems listed below. Physiatrist and rehab team continue to assess barriers to discharge/monitor patient progress toward functional and medical goals  Care Tool:  Bathing    Body parts  bathed by patient: Left upper leg, Right arm, Right lower leg, Left arm, Chest, Left lower leg, Abdomen, Front perineal area, Face, Buttocks, Right upper leg   Body parts bathed by helper: Front perineal area, Buttocks, Right lower leg Body parts n/a: Left lower leg   Bathing assist Assist Level: Supervision/Verbal cueing     Upper Body Dressing/Undressing Upper body dressing   What is the patient wearing?: Pull over shirt    Upper body assist Assist Level: Supervision/Verbal cueing    Lower Body Dressing/Undressing Lower body dressing      What is the patient wearing?: Pants, Orthosis, Ace wrap/stump shrinker     Lower body assist Assist for lower body  dressing: Supervision/Verbal cueing     Toileting Toileting    Toileting assist Assist for toileting: Supervision/Verbal cueing     Transfers Chair/bed transfer  Transfers assist  Chair/bed transfer activity did not occur: Safety/medical concerns  Chair/bed transfer assist level: Supervision/Verbal cueing     Locomotion Ambulation   Ambulation assist   Ambulation activity did not occur: Safety/medical concerns (2/2 endurance deficit and WB restrictions)          Walk 10 feet activity   Assist  Walk 10 feet activity did not occur: Safety/medical concerns (2/2 endurance deficit and WB restrictions)        Walk 50 feet activity   Assist Walk 50 feet with 2 turns activity did not occur: Safety/medical concerns (2/2 endurance deficit and WB restrictions)         Walk 150 feet activity   Assist Walk 150 feet activity did not occur: Safety/medical concerns (2/2 endurance deficit and WB restrictions)         Walk 10 feet on uneven surface  activity   Assist Walk 10 feet on uneven surfaces activity did not occur: Safety/medical concerns (2/2 endurance deficit and WB restrictions)         Wheelchair     Assist Is the patient using a wheelchair?: Yes Type of Wheelchair: Manual    Wheelchair assist level: Supervision/Verbal cueing Max wheelchair distance: 100    Wheelchair 50 feet with 2 turns activity    Assist        Assist Level: Supervision/Verbal cueing   Wheelchair 150 feet activity     Assist      Assist Level: Moderate Assistance - Patient 50 - 74%   Blood pressure (!) 128/49, pulse 67, temperature 97.7 F (36.5 C), temperature source Oral, resp. rate 16, height 5' 5 (1.651 m), weight 98.6 kg, SpO2 98%.  Medical Problem List and Plan: 1. 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                -patient may shower if leg is covered             -ELOS/Goals: 7-10  days, mod I w/c level, supervision ambulatory level  -Continue CIR therapies including PT, OT   - Expected discharge 6/26  -Team conference today please see physician documentation under team conference tab, met with team  to discuss problems,progress, and goals. Formulized individual treatment plan based on medical history, underlying problem and comorbidities.   -Dr. Harden examined amputation site, recommend she wear a black stump shrinker and follow-up outpatient  - DC home today   2.  Antithrombotics: -DVT/anticoagulation:  Pharmaceutical: *dc'ed sq heparin  d/t bleeding from sq inj sites  -SCD RLE--DC'd due to bruising/rash, appears stable             -  antiplatelet therapy: Aspirin  81 mg daily, Brilinta  90 mg twice daily- - 6/21-22: ongoing hematuria and uptrending BUN to creatinine ratio concerning for bleeding on DAPT.  Hemoglobin has remained stable--defer adjustments to primary given high risk  6/21: Right lower extremity duplex negative  3. Pain Management: Neurontin  300 mg 3 times daily, oxycodone  as needed             -pt reports her worst pain is from her low back and related left lumbar radiculopathy. No phantom pain currently                         -continue above meds for now. Encouraged oob                           -therapy can work on ROM, posture, etc.  4. Mood/Behavior/Sleep/hallucinations: Lexapro  10 mg daily, BuSpar  15 mg 3 times daily             -mood is positive             -antipsychotic agents: N/A  6-21: DC Lexapro  due to hyponatremia. Add melatonin for sleep 5 mg PRN.  Patient does not want to have your sleep medications due to prior poor experiences.  No evidence of active hallucinations on exam.  6/22: No endorsed hallucinations, delusions at this time.  5. Neuropsych/cognition: This patient is capable of making decisions on her own behalf. 6.  Wound Care/ID:  daptomycin  through 12/28/23 for MRSA bacteremia             -MRSA contact precautions              -pt was changed to traditional vac dressing  6/12 as prevena was not holding suction.  6/15-Still no drainage in cannister. Dressing is due for a change Monday.   **Consider removing Vac Monday if no further drainage in cannister.   -Dr. Harden note indicated plan for 1 week wound vac after DC  -6/19 DC wound VAC today  6/22-23: Wound looks excellent; continue shrinker sock and open to air  6/24 new limb protector ordered, prior was too small, think this may have contributed to some of the abrasions on her leg  6/25 new limb protector appears to be fitting better  7. Fluids/Electrolytes/Nutrition: Routine in and outs with follow-up chemistries 8.  Acute on chronic anemia.   Hgb with drop today to 7.3. minimal sx             -transfused 1u PRBC 6/13             -recheck hgb up to 8.1             -continue Brilinta  for now  -holding heparin  as above  -fe++ supp  -6/16 HGB 7.4, continue to monitor  -6/19  HGB stable 7.6, continue to monitor   6-22: Hemoglobin stable 7.5, see #2 above  6/23 HGB 7.1, nephrology gave ESA, can consider transfusion if goes any lower  Recheck today CBC  6/26 Hgb remains low but stable, follow-up outpatient with PCP for continued monitoring   9.  Diabetes mellitus with peripheral neuropathy.  Hemoglobin A1c 10.2.  NovoLog  3 units 3 times daily, Semglee  5 units daily.  Check blood sugars AC and at bedtime  - Blood sugars well-controlled Recent Labs    01/08/24 2143 01/09/24 0623 01/09/24 1124  GLUCAP 110* 96 125*    Continue current regimen  -  Continue PCP follow-up for continued monitoring  10.  CAD/stenting/angioplasty as well as history of mitral valve replacement.  Continue aspirin  and Brilinta .  Follow-up cardiology services 11.  Diastolic congestive heart failure.  Coreg  9.375 mg twice daily, Demadex  60 mg daily (hold).  Monitor for any signs of fluid overload             -need weights-stable  - 6/17 Coreg  held by nephrology  -6/18-20 stable continue  to monitor NO signs of overload  01-03-21: Stable weights, stable appearance.  Nephrology discontinued midodrine .  6/23 weight  higher, suspect inaccurate reading  6/25 nephrology restarted torsemide  at 20mg  daily   Filed Weights   01/06/24 0300 01/07/24 0534 01/09/24 0621  Weight: 102.6 kg 98.8 kg 98.6 kg   t 12.  GERD.  Protonix  13.  Hyperlipidemia.  Zetia  14.  Chronic hypoxic respiratory failure/OSA.  Continue CPAP  15.  CKD stage IV.  Baseline 2.2-2.4.               6/14 -Cr bumped  to 2.7 and sl to 2.81 today              -may be related to anemia              -continue to hold demadex  6/15- Cr continues to trend up-- 3.22  -still think this is due to demadex  she has received up until Friday  - push fluids and recheck BMET in am  -if further increase tomorrow will need to contact nephrology 6/16 Nephrology consulted Cr a little higher today 6/17 nephrologist started midodrine  Coreg  held -Foley removed for UA and culture to be completed 6/19 discussed with nephrology regarding pyuria, since Foley just removed and no symptoms continue to monitor for now and will plan to repeat UA in several days. 6/20 BP has been stable, appears midodrine  has been discontinued, creatinine very slowly improving now at 3.54 6-21: Creatinine downtrending, 3.36 today.  BUN stable 64.  Worsening hyponatremia at 132; labs to evaluate 6-22: Appreciate nephrology recommendations; recommending IgA considering fair amount of hematuria/proteinuria, however likely etiology diabetic kidney disease plus Brilinta  and ?UTI. 6/23 BUN and creatinine improved to 64/3.18, continue to monitor trend -6/25 Cr slightly up at 3.33 today, will contact nephrology about possible DC tomorrow   6/26 discussed with nephrology yesterday, okay with discharge home, continue to follow-up outpatient     Latest Ref Rng & Units 01/09/2024    5:02 AM 01/08/2024    4:55 AM 01/07/2024    5:00 AM  BMP  Glucose 70 - 99 mg/dL 96  855  880    BUN 6 - 20 mg/dL 52  56  60   Creatinine 0.44 - 1.00 mg/dL 6.74  6.66  6.72   Sodium 135 - 145 mmol/L 135  134  133   Potassium 3.5 - 5.1 mmol/L 4.7  4.6  5.0   Chloride 98 - 111 mmol/L 103  101  102   CO2 22 - 32 mmol/L 25  22  21    Calcium  8.9 - 10.3 mg/dL 8.3  8.4  8.4         3/73/7974    6:21 AM 01/09/2024    6:14 AM 01/08/2024    9:41 PM  Vitals with BMI  Weight 217 lbs 6 oz    BMI 36.17    Systolic  128 123  Diastolic  49 55  Pulse  67 63    16.  History of gout.  Allopurinol .  Monitor for any gout flares 17. R leg rash  - Hydrocortisone  cream started.  - Advised her not to use the compression stocking device she was using previously.  Neurology considering IgA -6/20 improving, continue current regimen 6-22: diffuse/bruising pattern, duplex negative for DVT but noticed a lot of interstitial edema.  Rash is nontender, not itchy, and localized.  Agree likely localized trauma due to SCD and DAPT.  No changes noted over this weekend. 6/25-26 improving, follow-up with PCP  18. Urinary retention  -Foley removed, monitor voiding trial - 6/18 will message nursing to ensure regular PVRs/Bladder scans, will also ask about U/A- dont see done yet  6/19 patient did not have elevated bladder scans yesterday, has been continent  6/20 PVR 0 this AM, continent  6/21: No PVRs.  Continent of urine.  -Will do purewick at night   19. Occasional tremor  -Renal related? Continue to monitor .   6/19 discussed with nephrology not felt to be uremic related.  Possibly due to discontinuation of beta-blocker  6/20 hopefully will improve when beta-blocker reinitiated  20. Dysuria, likely UTI  -Will send urine sample for cx and plan to start abx after this  -Keflex  started 250mg  Q24h, monitor culture- Addendum- DC renal started cipro   6/21: Urinalysis with large blood, rare bacteria, no leukocyte esterase or nitrates.  DC Cipro . Patient Cr too high for analgesic.   6/22: Cipro  resumed by  nephrology, urine cultures with less than 10K.  Will continue now as is nephrology's preference but defer to primary team ongoing necessity--did discuss this with patient.  6/24 pharmacy asks about changing to keflex  due to culture results- ok to change  Symptoms improved  21.  Hyponatremia.  Downtrending since admission, at 132 today.  - DC Lexapro   - Placed 1200 cc fluid restriction  - Urine studies ordered for today to evaluate; not on any diuretics  6/22: Slightly improved today, 134.  Labs appear borderline normal, slightly hypertonic, liberalize fluids and encourage p.o.'s today.  6/25 stable at 135  22. R shoulder pain  -Xray ordered- prior fracture, no acute changes. likely OA vs Rotator cuff aggravated by UE activity, lidocaine  patch  -Likely aggravated by therapy, consider MRI if does not improve outpatient  LOS: 13 days A FACE TO FACE EVALUATION WAS PERFORMED  Murray Collier 01/09/2024, 3:16 PM

## 2024-01-13 ENCOUNTER — Telehealth: Payer: Self-pay

## 2024-01-13 NOTE — Telephone Encounter (Signed)
 Copied from CRM [id number]. Topic: Clinical - Home Health Verbal Orders >> Jan 13, 2024  8:25 AM Rosina BIRCH wrote: Caller/Agency: pamela from [facility] Callback Number: [phone] Service Requested: resumptive of care Frequency: one week two and want to do re certification with the visit this week for the dates 7/4-9/1 Any new concerns about the patient? Yes right leg swollen and probably need her circulation checked on her right leg because of edema and Sharlet will use an adjustable leg wrap

## 2024-01-13 NOTE — Telephone Encounter (Signed)
 Transitional Care Call--who you spoke with Patient Dana [name]    Are you/is patient experiencing any problems since coming home?  No problem. Are there any questions regarding any aspect of care? No, Are there any questions regarding medications administration/dosing? No. Are meds being taken as prescribed?Yes. Patient should review meds with caller to confirm. All medications confirmed. Have there been any falls? No Has Home Health been to the house and/or have they contacted you? Yes, but still waiting on PT.  If not, have you tried to contact them? Patient will [facility] for to schedule the PT visit. Can we help you contact them?  No. Are bowels and bladder emptying properly? Yes.  Are there any unexpected incontinence issues? No.  If applicable, is patient following bowel/bladder programs? N/A Any fevers, problems with breathing, unexpected pain? No. Are there any skin problems or new areas of breakdown? No.  Has the patient/family member arranged specialty MD follow up (ie cardiology/neurology/renal/surgical/etc)? All follow up appointments & phone numbers reviewed with patient.  Can we help arrange? She stated she will call each one today. But will see Abigail Free MD  (PCP) and not Internal Medicine [name]). Does the patient need any other services or support that we can help arrange? No. Patient will call back if needed for assistance. Are caregivers following through as expected in assisting the patient? Yes.         11. Has the patient quit smoking, drinking alcohol, or using drugs as recommended? Advised.   Appointment  [address] with [name] NP on 01/23/2024.

## 2024-01-13 NOTE — Telephone Encounter (Signed)
 Patient scheduled for the 8th of July. States she is no longer having issues with the swelling since reapplying her stockings, swelling has gone down.

## 2024-01-15 ENCOUNTER — Telehealth: Payer: Self-pay

## 2024-01-15 NOTE — Telephone Encounter (Unsigned)
 Copied from CRM [id number]. Topic: Clinical - Home Health Verbal Orders >> Jan 13, 2024  8:25 AM Rosina BIRCH wrote: Caller/Agency: pamela from [facility] Callback Number: [phone] Service Requested: resumptive of care Frequency: one week two and want to do re certification with the visit this week for the dates 7/4-9/1 Any new concerns about the patient? Yes right leg swollen and probably need her circulation checked on her right leg because of edema and Sharlet will use an adjustable leg wrap

## 2024-01-15 NOTE — Telephone Encounter (Signed)
 Verbal ok given to Rochelle at number below for orders requested below.  Copied from CRM [id number]. Topic: Clinical - Home Health Verbal Orders >> Jan 15, 2024  8:11 AM Donna BRAVO wrote: Caller/Agency: Rochelle Patient with [facility] Callback Number: [phone] Service Requested: Physical Therapy Frequency: 1 x 1 week for 8 weeks Any new concerns about the patient? No

## 2024-01-16 ENCOUNTER — Telehealth (INDEPENDENT_AMBULATORY_CARE_PROVIDER_SITE_OTHER): Admitting: Physician Assistant

## 2024-01-16 ENCOUNTER — Encounter: Payer: Self-pay | Admitting: Physician Assistant

## 2024-01-16 VITALS — Ht 65.0 in | Wt 220.0 lb

## 2024-01-16 DIAGNOSIS — R051 Acute cough: Secondary | ICD-10-CM | POA: Diagnosis not present

## 2024-01-16 DIAGNOSIS — J309 Allergic rhinitis, unspecified: Secondary | ICD-10-CM | POA: Diagnosis not present

## 2024-01-16 MED ORDER — ALBUTEROL SULFATE HFA 108 (90 BASE) MCG/ACT IN AERS: 2.0000 | INHALATION_SPRAY | Freq: Four times a day (QID) | RESPIRATORY_TRACT | 2 refills | Status: DC | PRN

## 2024-01-16 MED ORDER — LEVOCETIRIZINE DIHYDROCHLORIDE 5 MG PO TABS: 5.0000 mg | ORAL_TABLET | Freq: Every evening | ORAL | 3 refills | Status: DC

## 2024-01-16 NOTE — Progress Notes (Signed)
 Virtual Visit via Video Note   This visit type was conducted per patient request This format is felt to be appropriate for this patient at this time.  All issues noted in this document were discussed and addressed.  A limited physical exam was performed with this format.  A verbal consent was obtained for the virtual visit.   Date:  01/26/2024   ID:  Dana [name], DOB 25-May-1961, MRN [id number]  Patient Location: Home Provider Location: Office/Clinic  PCP:  Sherre Clapper, MD   Chief Complaint  Patient presents with   Cough     History of Present Illness:    The patient does not have symptoms concerning for COVID-19 infection (fever, chills, cough, or new shortness of breath).   Discussed the use of AI scribe software for clinical note transcription with the patient, who gave verbal consent to proceed.  History of Present Illness   Dana [name] is a 63 year old female with bronchitis and congestive heart failure who presents with sinus congestion and a recent episode of vomiting.  She experiences sinus congestion with a tight feeling in her chest and a persistent, productive cough with clear sputum. The congestion disrupts her sleep and comfort when lying down. No fever is present. She has clear nasal discharge and chest pain attributed to the congestion, but no ear pain.  She recently underwent surgery and was hospitalized for a month, returning home last week. During her stay, she was on antibiotics, completing a course of Cefalexin a few days ago. She has a history of bronchitis and uses CPAP and oxygen  at night. No history of COPD or asthma is noted.  This morning, she had a violent episode of vomiting, which she attributes to constant coughing. This was her only episode of vomiting. Her symptoms, including watery and burning eyes, worsen in the morning, but she denies itchiness in her eyes.  She does not take regular allergy  medications but has used an over-the-counter  congestion relief medication for people with high blood pressure, which did not alleviate her cough. She has not used inhalers or taken prednisone  in the past. Her blood sugars are stable around 120 mg/dL during the day.  No shortness of breath except when coughing. She has used Tessalon  Perles in the past without significant relief and recalls using a cough syrup with codeine prescribed by a previous doctor, which she found helpful. She finished her course of antibiotics a few days ago.       Past Medical History:  Diagnosis Date   Acquired absence of left great toe (HCC)    Acquired absence of other left toe(s) (HCC)    ACS (acute coronary syndrome) (HCC)    Acute bronchitis due to COVID-19 virus 03/03/2023   Acute on chronic combined systolic and diastolic CHF (congestive heart failure) (HCC) 09/16/2023   Acute respiratory failure with hypoxia (HCC) 07/20/2023   Anemia    Anoxic brain injury (HCC)    Anxiety    CAD (coronary artery disease)    DES mLAD 04/07/15; DES dRCA & DES pPDA 08/30/16; DES mCX & PTCA OM1 09/29/20   Charcot's joint of foot in type 2 diabetes mellitus (HCC) 12/15/2023   Chronic diastolic (congestive) heart failure (HCC)    Chronic pain    Chronic systolic (congestive) heart failure (HCC)    CKD (chronic kidney disease)    Contrast dye induced nephropathy, possible 09/03/2020   COPD (chronic obstructive pulmonary disease) (HCC)    COVID-19 virus  infection 03/03/2023   Demand ischemia (HCC)    Diabetes (HCC)type 1    Diastolic CHF (HCC)    Dyspnea    Encounter for screening mammogram for malignant neoplasm of breast 09/16/2023   Family history of breast cancer 10/23/2021   Gastro-esophageal reflux disease without esophagitis    Hyperlipidemia    Hypertension    Hypertensive heart disease with heart failure (HCC)    Hypoxemia    Idiopathic gout, multiple sites    Leukocytosis 03/29/2022   Localized edema    Low back pain    Mitral regurgitation    Mitral  regurgitation    Mixed hyperlipidemia    Mixed incontinence    Morbid (severe) obesity due to excess calories (HCC)    Neuromuscular disorder (HCC)    Neuropathy    Non-ST elevation (NSTEMI) myocardial infarction (HCC)    08/29/20, 09/29/20   OSA (obstructive sleep apnea) 09/03/2020   Other specified hypothyroidism    PAF (paroxysmal atrial fibrillation) (HCC)    s/p pulmonary vein isolation by cryoablation 10/04/15 Dr. Meldon in [facility]   PEA (Pulseless electrical activity) [facility])    ~ 01/13/21 at [facility] during admission DKA, volume overload, possible CAP, hypoxia s/p ACLS with ROSC ~ 10-15   Pneumonia    Primary insomnia    Rheumatoid arthritis (HCC)    Stroke (HCC)    Thyroid  disease    Type 1 diabetes mellitus (HCC)    URI with cough and congestion 08/22/2023   Vitamin D  deficiency     Past Surgical History:  Procedure Laterality Date   ABDOMINAL HYSTERECTOMY     Partial- Still has both ovaries   AMPUTATION Left 12/18/2023   Procedure: AMPUTATION BELOW KNEE;  Surgeon: Harden Jerona GAILS, MD;  Location: [facility] OR;  Service: Orthopedics;  Laterality: Left;   ANKLE FUSION Left 11/13/2023   Procedure: HINDFOOT FUSION FIXATION OF ANKLE;  Surgeon: Kendal Franky SQUIBB, MD;  Location: [facility] OR;  Service: Orthopedics;  Laterality: Left;   CARDIOVASCULAR STRESS TEST  08/13/2014   Nuclear; Normal   CARPAL TUNNEL RELEASE     CATARACT EXTRACTION, BILATERAL     CESAREAN SECTION     CHOLECYSTECTOMY     CORONARY ANGIOPLASTY WITH STENT PLACEMENT     3 blockages/ 1 stent   CORONARY BALLOON ANGIOPLASTY N/A 07/18/2023   Procedure: CORONARY BALLOON ANGIOPLASTY;  Surgeon: Verlin Lonni BIRCH, MD;  Location: [facility] INVASIVE CV LAB;  Service: Cardiovascular;  Laterality: N/A;   CORONARY PRESSURE/FFR STUDY N/A 09/07/2022   Procedure: INTRAVASCULAR PRESSURE WIRE/FFR STUDY;  Surgeon: Swaziland, Peter M, MD;  Location: [facility] INVASIVE CV LAB;  Service: Cardiovascular;  Laterality: N/A;   CORONARY STENT INTERVENTION  N/A 09/29/2020   Procedure: CORONARY STENT INTERVENTION;  Surgeon: Wonda Sharper, MD;  Location: [facility] INVASIVE CV LAB;  Service: Cardiovascular;  Laterality: N/A;  CFX   CORONARY STENT INTERVENTION N/A 09/07/2022   Procedure: CORONARY STENT INTERVENTION;  Surgeon: Swaziland, Peter M, MD;  Location: [facility] INVASIVE CV LAB;  Service: Cardiovascular;  Laterality: N/A;   CORONARY STENT INTERVENTION N/A 02/13/2023   Procedure: CORONARY STENT INTERVENTION;  Surgeon: Verlin Lonni BIRCH, MD;  Location: [facility] INV[facility] LAB;  Service: Cardiovascular;  Laterality: N/A;  LAD   CORONARY ULTRASOUND/IVUS N/A 02/13/2023   Procedure: Coronary Ultrasound/IVUS;  Surgeon: Verlin Lonni BIRCH, MD;  Location: [facility] INVAS[facility] CV LAB;  Service: Cardiovascular;  Laterality: N/A;   CORONARY/GRAFT ACUTE MI REVASCULARIZATION N/A 12/08/2022   Procedure: Coronary/Graft Acute MI Revascularization;  Surgeon: Anner,  Alm ORN, MD;  Location: [facility] INVAS[facility] CV LAB;  Service: Cardiovascular;  Laterality: N/A;   EYE SURGERY     LEFT HEART CATH AND CORONARY ANGIOGRAPHY N/A 09/29/2020   Procedure: LEFT HEART CATH AND CORONARY ANGIOGRAPHY;  Surgeon: Wonda Sharper, MD;  Location: [facility] INVASIVE CV LAB;  Service: Cardiovascular;  Laterality: N/A;   LEFT HEART CATH AND CORONARY ANGIOGRAPHY N/A 09/07/2022   Procedure: LEFT HEART CATH AND CORONARY ANGIOGRAPHY;  Surgeon: Swaziland, Peter M, MD;  Location: [facility] INVASIVE CV LAB;  Service: Cardiovascular;  Laterality: N/A;   LEFT HEART CATH AND CORONARY ANGIOGRAPHY N/A 12/08/2022   Procedure: LEFT HEART CATH AND CORONARY ANGIOGRAPHY;  Surgeon: Anner Alm ORN, MD;  Location: [facility] INVASIVE CV LAB;  Service: Cardiovascular;  Laterality: N/A;   LEFT HEART CATH AND CORONARY ANGIOGRAPHY N/A 02/13/2023   Procedure: LEFT HEART CATH AND CORONARY ANGIOGRAPHY;  Surgeon: Verlin Lonni BIRCH, MD;  [facility]: [facility] INV[facility] CV LAB;  Service: Cardiovascular;  Laterality: N/A;   LEFT HEART CATH AND CORONARY ANGIOGRAPHY  N/A 07/16/2023   Procedure: LEFT HEART CATH AND CORONARY ANGIOGRAPHY;  Surgeon: Verlin Lonni BIRCH, MD;  [facility]: [facility] LAB;  Service: Cardiovascular;  Laterality: N/A;   RIGHT HEART CATH N/A 04/27/2021   Procedure: RIGHT HEART CATH;  Surgeon: Cherrie Toribio SAUNDERS, MD;  Location: [facility] INVAS[facility] CV LAB;  Service: Cardiovascular;  Laterality: N/A;   RIGHT/LEFT HEART CATH AND CORONARY ANGIOGRAPHY N/A 01/07/2017   Procedure: Right/Left Heart Cath and Coronary Angiography;  Surgeon: Rolan Ezra RAMAN, MD;  Location: [facility] INVASIVE CV LAB;  Service: Cardiovascular;  Laterality: N/A;   ROTATOR CUFF REPAIR     TEE WITHOUT CARDIOVERSION N/A 04/28/2021   Procedure: TRANSESOPHAGEAL ECHOCARDIOGRAM (TEE);  Surgeon: Cherrie Toribio SAUNDERS, MD;  Location: [facility] ENDOSCOPY;  Service: Cardiovascular;  Laterality: N/A;   TEE WITHOUT CARDIOVERSION N/A 05/18/2021   Procedure: TRANSESOPHAGEAL ECHOCARDIOGRAM (TEE);  Surgeon: Thukkani, Arun K, MD;  Location: [facility] INVASIVE CV LAB;  Service: Cardiovascular;  Laterality: N/A;   TOE AMPUTATION Left    TRANSCATHETER MITRAL EDGE TO EDGE REPAIR N/A 05/18/2021   Procedure: MITRAL VALVE REPAIR;  Surgeon: Wendel Lurena POUR, MD;  Location: [facility] INVASIVE CV LAB;  Service: Cardiovascular;  Laterality: N/A;   TRANSESOPHAGEAL ECHOCARDIOGRAM (CATH LAB) N/A 12/23/2023   Procedure: TRANSESOPHAGEAL ECHOCARDIOGRAM;  Surgeon: Kate Lonni CROME, MD;  Location: [facility] INVASIVE CV LAB;  Service: Cardiovascular;  Laterality: N/A;   US  ECHOCARDIOGRAPHY  05/2017   Normal    Family History  Problem Relation Age of Onset   Breast cancer Mother 60       contralateral breast ca dx 29   Coronary artery disease Mother    Hypertension Mother    Hyperlipidemia Mother    Diabetes type II Mother    Lung cancer Mother 61   Diabetes Father    Hypertension Father    Mental illness Sister    Bipolar disorder Other    Depression Other    Schizophrenia Other    Cancer Other        MGF's sisters x2;  unknown type    Social History   Socioeconomic History   Marital status: Married    Spouse name: [name]   Number of children: 2   Years of education: 16   Highest education level: Master's degree (e.g., MA, MS, MEng, MEd, MSW, MBA)  Occupational History   Occupation: on disability/retired early former Engineer, site  Tobacco Use   Smoking status: Former    Current packs/day: 0.00  Types: Cigarettes    Start date: 74    Quit date: 1984    Years since quitting: 41.5    Passive exposure: Past   Smokeless tobacco: Never  Vaping Use   Vaping status: Never Used  Substance and Sexual Activity   Alcohol use: Never   Drug use: Never   Sexual activity: Not on file  Other Topics Concern   Not on file  Social History Narrative   Not on file   Social Drivers of Health   Financial Resource Strain: Low Risk  (10/24/2023)   Overall Financial Resource Strain (CARDIA)    Difficulty of Paying Living Expenses: Not hard at all  Food Insecurity: No Food Insecurity (01/21/2024)   Hunger Vital Sign    Worried About Running Out of Food in the Last Year: Never true    Ran Out of Food in the Last Year: Never true  Transportation Needs: No Transportation Needs (01/21/2024)   PRAPARE - Administrator, Civil Service (Medical): No    Lack of Transportation (Non-Medical): No  Physical Activity: Insufficiently Active (10/24/2023)   Exercise Vital Sign    Days of Exercise per Week: 3 days    Minutes of Exercise per Session: 30 min  Stress: Stress Concern Present (10/24/2023)   [employer] of Occupational Health - Occupational Stress Questionnaire    Feeling of Stress : To some extent  Social Connections: Moderately Integrated (12/14/2023)   Social Connection and Isolation Panel    Frequency of Communication with Friends and Family: More than three times a week    Frequency of Social Gatherings with Friends and Family: Three times a week    Attends Religious Services: More than 4  times per year    Active Member of Clubs or Organizations: No    Attends Banker Meetings: Never    Marital Status: Married  Catering manager Violence: Not At Risk (01/23/2024)   Humiliation, Afraid, Rape, and Kick questionnaire    Fear of Current or Ex-Partner: No    Emotionally Abused: No    Physically Abused: No    Sexually Abused: No    Outpatient Medications Prior to Visit  Medication Sig Dispense Refill   allopurinol  (ZYLOPRIM ) 100 MG tablet Take 0.5 tablets (50 mg total) by mouth daily. 30 tablet 0   aspirin  EC 81 MG tablet Take 1 tablet (81 mg total) by mouth daily. Swallow whole. 30 tablet 12   busPIRone  (BUSPAR ) 15 MG tablet Take 1 tablet (15 mg total) by mouth 3 (three) times daily. 90 tablet 0   carvedilol  (COREG ) 6.25 MG tablet Take 6.25 mg by mouth 2 (two) times daily with a meal.     Continuous Glucose Sensor (FREESTYLE LIBRE 2 SENSOR) MISC Inject 1 device into skin every 14 (fourteen) days. 2 each 2   diclofenac  Sodium (VOLTAREN ) 1 % GEL Apply 2 g topically 4 (four) times daily. 100 g 0   Evolocumab  (REPATHA  SURECLICK) 140 MG/ML SOAJ Inject 140 mg into the skin every 14 (fourteen) days. 6 mL 1   ezetimibe  (ZETIA ) 10 MG tablet Take 1 tablet (10 mg total) by mouth daily. 30 tablet 0   gabapentin  (NEURONTIN ) 300 MG capsule Take 1 capsule (300 mg total) by mouth 2 (two) times daily. 60 capsule 0   Glucagon  (GVOKE HYPOPEN  2-PACK) 0.5 MG/0.1ML SOAJ Inject 0.5 mg into the skin daily as needed. 0.2 mL 5   insulin  aspart (NOVOLOG  FLEXPEN) 100 UNIT/ML FlexPen Inject 2 - 10 Units  before meals based on SSI. 15 mL 3   insulin  glargine (LANTUS ) 100 UNIT/ML Solostar Pen Inject 5 Units into the skin daily. Expires 28 days after first use 15 mL 11   Insulin  Pen Needle (BD PEN NEEDLE NANO 2ND GEN) 32G X 4 MM MISC Inject 1 each into the skin in the morning, at noon, in the evening, and at bedtime. 600 each 3   Insulin  Pen Needle 32G X 4 MM MISC Use with Lantus  pen 100 each 0    latanoprost  (XALATAN ) 0.005 % ophthalmic solution Place 1 drop into both eyes at bedtime. 2.5 mL 0   lidocaine  4 % Place 1 patch onto the skin daily. Remove & Discard patch within 12 hours or as directed by MD 12 patch 0   linaclotide  (LINZESS ) 145 MCG CAPS capsule Take 1 capsule (145 mcg total) by mouth daily before breakfast. 30 capsule 0   Menthol -Methyl Salicylate  (ICY HOT EXTRA STRENGTH) 10-30 % CREA Apply 1 Application topically as needed (bilateral hips and knees). 85 g 1   nitroGLYCERIN  (NITROSTAT ) 0.4 MG SL tablet DISSOLVE ONE TABLET UNDER THE TONGUE EVERY 5 MINUTES AS NEEDED FOR CHEST PAIN.  DO NOT EXCEED A TOTAL OF 3 DOSES IN 15 MINUTES 50 tablet 3   oxyCODONE  (ROXICODONE ) 5 MG immediate release tablet Take 1 tablet (5 mg total) by mouth every 4 (four) hours as needed for severe pain (pain score 7-10). 30 tablet 0   OXYGEN  Inhale 2 L/min into the lungs at bedtime.     pantoprazole  (PROTONIX ) 40 MG tablet Take 1 tablet (40 mg total) by mouth daily. 30 tablet 0   polyethylene glycol powder (GLYCOLAX /MIRALAX ) 17 GM/SCOOP powder Take 1 capful (17 g) with water by mouth daily. 238 g 0   ticagrelor  (BRILINTA ) 90 MG TABS tablet Take 1 tablet (90 mg total) by mouth 2 (two) times daily. 60 tablet 0   torsemide  (DEMADEX ) 20 MG tablet Take 1 tablet (20 mg total) by mouth daily. 30 tablet 0   vitamin D3 (CHOLECALCIFEROL ) 25 MCG tablet Take 2 tablets (2,000 Units total) by mouth 2 (two) times daily. 120 tablet 2   acetaminophen  (TYLENOL ) 500 MG tablet Take 1,000 mg by mouth every 6 (six) hours as needed for mild pain (pain score 1-3) or headache.     cephALEXin  (KEFLEX ) 500 MG capsule Take 1 capsule (500 mg total) by mouth 2 (two) times daily. 5 capsule 0   LORazepam  (ATIVAN ) 0.5 MG tablet Take 1 tablet (0.5 mg total) by mouth daily as needed for anxiety. 30 tablet 0   magnesium  oxide (MAG-OX) 400 MG tablet Take 2 tablets (800 mg total) by mouth daily. Take 2 (400 mg) daily=800 mg 60 tablet 0   Omega-3  Fatty Acids (FISH OIL PO) Take 1 capsule by mouth daily.     potassium chloride  SA (KLOR-CON  M) 20 MEQ tablet Take 1 tablet (20 mEq total) by mouth daily. 30 tablet 0   Probiotic CAPS Take 1 capsule by mouth daily.     No facility-administered medications prior to visit.    Allergies  Allergen Reactions   Naproxen Hives and Rash   Shellfish-Derived Products Hives, Swelling and Other (See Comments)    Angioedema    Strawberry (Diagnostic) Hives   Celecoxib Other (See Comments)    Stomach pain   Doxepin  Hcl Other (See Comments)    Lethargic, sleeping a lot per pt and family   Glucosamine Hives, Swelling and Other (See Comments)    Angioedema  Iron  Hives and Other (See Comments)    IV iron  transfusion - hives - Feb 2022 hospitalization  Patient said she CAN tolerate if given Benadryl  first   Metoclopramide Other (See Comments)    Facial twitching and stuttering   Metolazone  Other (See Comments)    Acute renal failure   Statins Other (See Comments)    Muscle pain     Social History   Tobacco Use   Smoking status: Former    Current packs/day: 0.00    Types: Cigarettes    Start date: 2    Quit date: 1984    Years since quitting: 41.5    Passive exposure: Past   Smokeless tobacco: Never  Vaping Use   Vaping status: Never Used  Substance Use Topics   Alcohol use: Never   Drug use: Never     Review of Systems  Constitutional:  Negative for fever and malaise/fatigue.  HENT:  Positive for congestion. Negative for sore throat.   Respiratory:  Positive for cough. Negative for shortness of breath.   Cardiovascular:  Negative for chest pain.  Gastrointestinal:  Positive for vomiting.  Musculoskeletal:  Negative for myalgias.  Neurological:  Negative for dizziness and weakness.     Labs/Other Tests and Data Reviewed:    Recent Labs: 01/20/2024: B Natriuretic Peptide 1,398.9 01/23/2024: ALT 7; Hemoglobin 9.2; Platelets 320 01/24/2024: Magnesium  2.0 01/25/2024: BUN  54; Creatinine, Ser 2.64; Potassium 3.7; Sodium 136   Recent Lipid Panel Lab Results  Component Value Date/Time   CHOL 92 (L) 07/12/2023 11:05 AM   TRIG 81 07/12/2023 11:05 AM   HDL 47 07/12/2023 11:05 AM   CHOLHDL 2.0 07/12/2023 11:05 AM   CHOLHDL 2.4 09/30/2020 02:41 AM   LDLCALC 28 07/12/2023 11:05 AM    Wt Readings from Last 3 Encounters:  01/25/24 205 lb 0.4 oz (93 kg)  01/16/24 220 lb (99.8 kg)  01/09/24 217 lb 6 oz (98.6 kg)     Objective:    Vital Signs:  Ht 5' 5 (1.651 m)   Wt 220 lb (99.8 kg)   BMI 36.61 kg/m    Physical Exam  Unable to perform due to video visit  ASSESSMENT & PLAN:   Acute cough Assessment & Plan: Cough likely due to postnasal drip from allergic rhinitis. Previous Tessalon  Perles ineffective. Past codeine-containing syrup effective. - Review past prescriptions for codeine-containing cough syrup and prescribe if appropriate.   Allergic rhinitis, unspecified seasonality, unspecified trigger Assessment & Plan: Symptoms suggest allergies as primary cause. Xyzal  recommended for allergy  relief. Safe for short-term use in CHF patients. - Prescribe albuterol  inhaler every six hours as needed. - Recommend over-the-counter Xyzal  (levocetirizine). Attempt prescription for insurance coverage. - Order chest x-ray if symptoms persist by Monday.  Orders: -     Albuterol  Sulfate HFA; Inhale 2 puffs into the lungs every 6 (six) hours as needed for wheezing or shortness of breath.  Dispense: 8 g; Refill: 2 -     Levocetirizine Dihydrochloride ; Take 1 tablet (5 mg total) by mouth every evening.  Dispense: 30 tablet; Refill: 3    Follow-up Instructed to monitor symptoms and report if no improvement by Monday. - Order chest x-ray on Monday if symptoms persist.       No orders of the defined types were placed in this encounter.    Meds ordered this encounter  Medications   albuterol  (VENTOLIN  HFA) 108 (90 Base) MCG/ACT inhaler    Sig: Inhale 2 puffs  into the lungs every  6 (six) hours as needed for wheezing or shortness of breath.    Dispense:  8 g    Refill:  2   levocetirizine (XYZAL ) 5 MG tablet    Sig: Take 1 tablet (5 mg total) by mouth every evening.    Dispense:  30 tablet    Refill:  3     Follow Up:  In Person prn

## 2024-01-17 DIAGNOSIS — I509 Heart failure, unspecified: Secondary | ICD-10-CM | POA: Diagnosis not present

## 2024-01-17 DIAGNOSIS — I130 Hypertensive heart and chronic kidney disease with heart failure and stage 1 through stage 4 chronic kidney disease, or unspecified chronic kidney disease: Secondary | ICD-10-CM | POA: Diagnosis not present

## 2024-01-17 DIAGNOSIS — I5033 Acute on chronic diastolic (congestive) heart failure: Secondary | ICD-10-CM | POA: Diagnosis not present

## 2024-01-17 DIAGNOSIS — Z8616 Personal history of COVID-19: Secondary | ICD-10-CM | POA: Diagnosis not present

## 2024-01-17 DIAGNOSIS — E785 Hyperlipidemia, unspecified: Secondary | ICD-10-CM | POA: Diagnosis not present

## 2024-01-17 DIAGNOSIS — D649 Anemia, unspecified: Secondary | ICD-10-CM | POA: Diagnosis not present

## 2024-01-17 DIAGNOSIS — J189 Pneumonia, unspecified organism: Secondary | ICD-10-CM | POA: Diagnosis not present

## 2024-01-17 DIAGNOSIS — R0602 Shortness of breath: Secondary | ICD-10-CM | POA: Diagnosis not present

## 2024-01-17 DIAGNOSIS — N1832 Chronic kidney disease, stage 3b: Secondary | ICD-10-CM | POA: Diagnosis not present

## 2024-01-17 DIAGNOSIS — R410 Disorientation, unspecified: Secondary | ICD-10-CM | POA: Diagnosis not present

## 2024-01-17 DIAGNOSIS — E1022 Type 1 diabetes mellitus with diabetic chronic kidney disease: Secondary | ICD-10-CM | POA: Diagnosis not present

## 2024-01-17 DIAGNOSIS — N179 Acute kidney failure, unspecified: Secondary | ICD-10-CM | POA: Diagnosis present

## 2024-01-17 DIAGNOSIS — Z6832 Body mass index (BMI) 32.0-32.9, adult: Secondary | ICD-10-CM | POA: Diagnosis not present

## 2024-01-17 DIAGNOSIS — Z9889 Other specified postprocedural states: Secondary | ICD-10-CM | POA: Diagnosis not present

## 2024-01-17 DIAGNOSIS — R001 Bradycardia, unspecified: Secondary | ICD-10-CM | POA: Diagnosis not present

## 2024-01-17 DIAGNOSIS — I2721 Secondary pulmonary arterial hypertension: Secondary | ICD-10-CM | POA: Diagnosis not present

## 2024-01-17 DIAGNOSIS — I342 Nonrheumatic mitral (valve) stenosis: Secondary | ICD-10-CM | POA: Diagnosis not present

## 2024-01-17 DIAGNOSIS — Z89512 Acquired absence of left leg below knee: Secondary | ICD-10-CM | POA: Diagnosis not present

## 2024-01-17 DIAGNOSIS — F419 Anxiety disorder, unspecified: Secondary | ICD-10-CM | POA: Diagnosis not present

## 2024-01-17 DIAGNOSIS — G894 Chronic pain syndrome: Secondary | ICD-10-CM | POA: Diagnosis not present

## 2024-01-17 DIAGNOSIS — Z8674 Personal history of sudden cardiac arrest: Secondary | ICD-10-CM | POA: Diagnosis not present

## 2024-01-17 DIAGNOSIS — J984 Other disorders of lung: Secondary | ICD-10-CM | POA: Diagnosis not present

## 2024-01-17 DIAGNOSIS — Z794 Long term (current) use of insulin: Secondary | ICD-10-CM | POA: Diagnosis not present

## 2024-01-17 DIAGNOSIS — R059 Cough, unspecified: Secondary | ICD-10-CM | POA: Diagnosis not present

## 2024-01-17 DIAGNOSIS — E669 Obesity, unspecified: Secondary | ICD-10-CM | POA: Diagnosis not present

## 2024-01-17 DIAGNOSIS — G9341 Metabolic encephalopathy: Secondary | ICD-10-CM | POA: Diagnosis not present

## 2024-01-17 DIAGNOSIS — R569 Unspecified convulsions: Secondary | ICD-10-CM | POA: Diagnosis not present

## 2024-01-17 DIAGNOSIS — I361 Nonrheumatic tricuspid (valve) insufficiency: Secondary | ICD-10-CM | POA: Diagnosis not present

## 2024-01-17 DIAGNOSIS — I5043 Acute on chronic combined systolic (congestive) and diastolic (congestive) heart failure: Secondary | ICD-10-CM | POA: Diagnosis present

## 2024-01-17 DIAGNOSIS — I4581 Long QT syndrome: Secondary | ICD-10-CM | POA: Diagnosis not present

## 2024-01-17 DIAGNOSIS — E871 Hypo-osmolality and hyponatremia: Secondary | ICD-10-CM | POA: Diagnosis not present

## 2024-01-17 DIAGNOSIS — J159 Unspecified bacterial pneumonia: Secondary | ICD-10-CM | POA: Diagnosis not present

## 2024-01-17 DIAGNOSIS — J811 Chronic pulmonary edema: Secondary | ICD-10-CM | POA: Diagnosis not present

## 2024-01-17 DIAGNOSIS — E782 Mixed hyperlipidemia: Secondary | ICD-10-CM | POA: Diagnosis not present

## 2024-01-17 DIAGNOSIS — E66811 Obesity, class 1: Secondary | ICD-10-CM | POA: Diagnosis not present

## 2024-01-17 DIAGNOSIS — E1065 Type 1 diabetes mellitus with hyperglycemia: Secondary | ICD-10-CM | POA: Diagnosis not present

## 2024-01-17 DIAGNOSIS — D631 Anemia in chronic kidney disease: Secondary | ICD-10-CM | POA: Diagnosis not present

## 2024-01-17 DIAGNOSIS — I4510 Unspecified right bundle-branch block: Secondary | ICD-10-CM | POA: Diagnosis not present

## 2024-01-17 DIAGNOSIS — R4182 Altered mental status, unspecified: Secondary | ICD-10-CM | POA: Diagnosis not present

## 2024-01-17 DIAGNOSIS — F418 Other specified anxiety disorders: Secondary | ICD-10-CM | POA: Diagnosis not present

## 2024-01-17 DIAGNOSIS — I491 Atrial premature depolarization: Secondary | ICD-10-CM | POA: Diagnosis not present

## 2024-01-17 DIAGNOSIS — M159 Polyosteoarthritis, unspecified: Secondary | ICD-10-CM | POA: Diagnosis not present

## 2024-01-17 DIAGNOSIS — I5023 Acute on chronic systolic (congestive) heart failure: Secondary | ICD-10-CM | POA: Diagnosis not present

## 2024-01-17 DIAGNOSIS — F32A Depression, unspecified: Secondary | ICD-10-CM | POA: Diagnosis not present

## 2024-01-17 DIAGNOSIS — I2510 Atherosclerotic heart disease of native coronary artery without angina pectoris: Secondary | ICD-10-CM | POA: Diagnosis not present

## 2024-01-17 DIAGNOSIS — A419 Sepsis, unspecified organism: Secondary | ICD-10-CM | POA: Diagnosis present

## 2024-01-17 DIAGNOSIS — I5032 Chronic diastolic (congestive) heart failure: Secondary | ICD-10-CM | POA: Diagnosis not present

## 2024-01-17 DIAGNOSIS — R918 Other nonspecific abnormal finding of lung field: Secondary | ICD-10-CM | POA: Diagnosis not present

## 2024-01-17 DIAGNOSIS — I371 Nonrheumatic pulmonary valve insufficiency: Secondary | ICD-10-CM | POA: Diagnosis not present

## 2024-01-17 DIAGNOSIS — J9621 Acute and chronic respiratory failure with hypoxia: Secondary | ICD-10-CM | POA: Diagnosis not present

## 2024-01-17 DIAGNOSIS — J440 Chronic obstructive pulmonary disease with acute lower respiratory infection: Secondary | ICD-10-CM | POA: Diagnosis not present

## 2024-01-17 DIAGNOSIS — G4733 Obstructive sleep apnea (adult) (pediatric): Secondary | ICD-10-CM | POA: Diagnosis not present

## 2024-01-17 DIAGNOSIS — Z743 Need for continuous supervision: Secondary | ICD-10-CM | POA: Diagnosis not present

## 2024-01-17 DIAGNOSIS — M069 Rheumatoid arthritis, unspecified: Secondary | ICD-10-CM | POA: Diagnosis not present

## 2024-01-17 DIAGNOSIS — N184 Chronic kidney disease, stage 4 (severe): Secondary | ICD-10-CM | POA: Diagnosis not present

## 2024-01-17 DIAGNOSIS — R6520 Severe sepsis without septic shock: Secondary | ICD-10-CM | POA: Diagnosis not present

## 2024-01-17 DIAGNOSIS — R531 Weakness: Secondary | ICD-10-CM | POA: Diagnosis not present

## 2024-01-17 DIAGNOSIS — M8580 Other specified disorders of bone density and structure, unspecified site: Secondary | ICD-10-CM | POA: Diagnosis not present

## 2024-01-17 DIAGNOSIS — Z7982 Long term (current) use of aspirin: Secondary | ICD-10-CM | POA: Diagnosis not present

## 2024-01-17 DIAGNOSIS — I340 Nonrheumatic mitral (valve) insufficiency: Secondary | ICD-10-CM | POA: Diagnosis not present

## 2024-01-17 DIAGNOSIS — R9431 Abnormal electrocardiogram [ECG] [EKG]: Secondary | ICD-10-CM | POA: Diagnosis not present

## 2024-01-17 DIAGNOSIS — R339 Retention of urine, unspecified: Secondary | ICD-10-CM | POA: Diagnosis not present

## 2024-01-17 DIAGNOSIS — J90 Pleural effusion, not elsewhere classified: Secondary | ICD-10-CM | POA: Diagnosis not present

## 2024-01-17 DIAGNOSIS — I25119 Atherosclerotic heart disease of native coronary artery with unspecified angina pectoris: Secondary | ICD-10-CM | POA: Diagnosis not present

## 2024-01-17 DIAGNOSIS — G8929 Other chronic pain: Secondary | ICD-10-CM | POA: Diagnosis not present

## 2024-01-17 DIAGNOSIS — R7989 Other specified abnormal findings of blood chemistry: Secondary | ICD-10-CM | POA: Diagnosis not present

## 2024-01-17 DIAGNOSIS — Z79899 Other long term (current) drug therapy: Secondary | ICD-10-CM | POA: Diagnosis not present

## 2024-01-17 DIAGNOSIS — M549 Dorsalgia, unspecified: Secondary | ICD-10-CM | POA: Diagnosis not present

## 2024-01-18 DIAGNOSIS — I371 Nonrheumatic pulmonary valve insufficiency: Secondary | ICD-10-CM | POA: Diagnosis not present

## 2024-01-18 DIAGNOSIS — I342 Nonrheumatic mitral (valve) stenosis: Secondary | ICD-10-CM | POA: Diagnosis not present

## 2024-01-18 DIAGNOSIS — I340 Nonrheumatic mitral (valve) insufficiency: Secondary | ICD-10-CM | POA: Diagnosis not present

## 2024-01-18 DIAGNOSIS — I361 Nonrheumatic tricuspid (valve) insufficiency: Secondary | ICD-10-CM | POA: Diagnosis not present

## 2024-01-19 ENCOUNTER — Encounter (HOSPITAL_COMMUNITY): Payer: Self-pay

## 2024-01-19 DIAGNOSIS — J811 Chronic pulmonary edema: Secondary | ICD-10-CM | POA: Diagnosis not present

## 2024-01-19 DIAGNOSIS — R339 Retention of urine, unspecified: Secondary | ICD-10-CM | POA: Diagnosis not present

## 2024-01-19 DIAGNOSIS — N179 Acute kidney failure, unspecified: Secondary | ICD-10-CM | POA: Diagnosis not present

## 2024-01-20 ENCOUNTER — Inpatient Hospital Stay (HOSPITAL_COMMUNITY)
Admission: AD | Admit: 2024-01-20 | Discharge: 2024-01-25 | DRG: 291 | Disposition: A | Discharge status: Home Health | Source: Other Acute Inpatient Hospital | Attending: Internal Medicine | Admitting: Internal Medicine

## 2024-01-20 DIAGNOSIS — G894 Chronic pain syndrome: Secondary | ICD-10-CM | POA: Diagnosis present

## 2024-01-20 DIAGNOSIS — I371 Nonrheumatic pulmonary valve insufficiency: Secondary | ICD-10-CM | POA: Diagnosis not present

## 2024-01-20 DIAGNOSIS — E1022 Type 1 diabetes mellitus with diabetic chronic kidney disease: Secondary | ICD-10-CM | POA: Diagnosis present

## 2024-01-20 DIAGNOSIS — I130 Hypertensive heart and chronic kidney disease with heart failure and stage 1 through stage 4 chronic kidney disease, or unspecified chronic kidney disease: Principal | ICD-10-CM | POA: Diagnosis present

## 2024-01-20 DIAGNOSIS — K219 Gastro-esophageal reflux disease without esophagitis: Secondary | ICD-10-CM | POA: Diagnosis present

## 2024-01-20 DIAGNOSIS — Z818 Family history of other mental and behavioral disorders: Secondary | ICD-10-CM

## 2024-01-20 DIAGNOSIS — F418 Other specified anxiety disorders: Secondary | ICD-10-CM | POA: Diagnosis not present

## 2024-01-20 DIAGNOSIS — I5023 Acute on chronic systolic (congestive) heart failure: Secondary | ICD-10-CM | POA: Diagnosis not present

## 2024-01-20 DIAGNOSIS — Z8614 Personal history of Methicillin resistant Staphylococcus aureus infection: Secondary | ICD-10-CM

## 2024-01-20 DIAGNOSIS — Z8616 Personal history of COVID-19: Secondary | ICD-10-CM | POA: Diagnosis not present

## 2024-01-20 DIAGNOSIS — Z91013 Allergy to seafood: Secondary | ICD-10-CM

## 2024-01-20 DIAGNOSIS — Z6832 Body mass index (BMI) 32.0-32.9, adult: Secondary | ICD-10-CM | POA: Diagnosis not present

## 2024-01-20 DIAGNOSIS — Z743 Need for continuous supervision: Secondary | ICD-10-CM | POA: Diagnosis not present

## 2024-01-20 DIAGNOSIS — J984 Other disorders of lung: Secondary | ICD-10-CM | POA: Diagnosis not present

## 2024-01-20 DIAGNOSIS — Z7982 Long term (current) use of aspirin: Secondary | ICD-10-CM

## 2024-01-20 DIAGNOSIS — N179 Acute kidney failure, unspecified: Secondary | ICD-10-CM | POA: Diagnosis present

## 2024-01-20 DIAGNOSIS — Z981 Arthrodesis status: Secondary | ICD-10-CM

## 2024-01-20 DIAGNOSIS — E1065 Type 1 diabetes mellitus with hyperglycemia: Secondary | ICD-10-CM | POA: Diagnosis not present

## 2024-01-20 DIAGNOSIS — G9341 Metabolic encephalopathy: Principal | ICD-10-CM | POA: Diagnosis present

## 2024-01-20 DIAGNOSIS — J440 Chronic obstructive pulmonary disease with acute lower respiratory infection: Secondary | ICD-10-CM | POA: Diagnosis not present

## 2024-01-20 DIAGNOSIS — R569 Unspecified convulsions: Secondary | ICD-10-CM | POA: Diagnosis not present

## 2024-01-20 DIAGNOSIS — Z801 Family history of malignant neoplasm of trachea, bronchus and lung: Secondary | ICD-10-CM

## 2024-01-20 DIAGNOSIS — F32A Depression, unspecified: Secondary | ICD-10-CM | POA: Diagnosis present

## 2024-01-20 DIAGNOSIS — J189 Pneumonia, unspecified organism: Secondary | ICD-10-CM | POA: Diagnosis not present

## 2024-01-20 DIAGNOSIS — Z8674 Personal history of sudden cardiac arrest: Secondary | ICD-10-CM

## 2024-01-20 DIAGNOSIS — Z803 Family history of malignant neoplasm of breast: Secondary | ICD-10-CM

## 2024-01-20 DIAGNOSIS — M14672 Charcot's joint, left ankle and foot: Secondary | ICD-10-CM | POA: Diagnosis present

## 2024-01-20 DIAGNOSIS — G4733 Obstructive sleep apnea (adult) (pediatric): Secondary | ICD-10-CM | POA: Diagnosis present

## 2024-01-20 DIAGNOSIS — Z8249 Family history of ischemic heart disease and other diseases of the circulatory system: Secondary | ICD-10-CM

## 2024-01-20 DIAGNOSIS — E66811 Obesity, class 1: Secondary | ICD-10-CM | POA: Diagnosis not present

## 2024-01-20 DIAGNOSIS — D631 Anemia in chronic kidney disease: Secondary | ICD-10-CM | POA: Diagnosis present

## 2024-01-20 DIAGNOSIS — I5043 Acute on chronic combined systolic (congestive) and diastolic (congestive) heart failure: Secondary | ICD-10-CM | POA: Diagnosis not present

## 2024-01-20 DIAGNOSIS — Z833 Family history of diabetes mellitus: Secondary | ICD-10-CM

## 2024-01-20 DIAGNOSIS — M14671 Charcot's joint, right ankle and foot: Secondary | ICD-10-CM | POA: Diagnosis present

## 2024-01-20 DIAGNOSIS — Z89512 Acquired absence of left leg below knee: Secondary | ICD-10-CM

## 2024-01-20 DIAGNOSIS — E782 Mixed hyperlipidemia: Secondary | ICD-10-CM | POA: Diagnosis not present

## 2024-01-20 DIAGNOSIS — Z8349 Family history of other endocrine, nutritional and metabolic diseases: Secondary | ICD-10-CM

## 2024-01-20 DIAGNOSIS — I25119 Atherosclerotic heart disease of native coronary artery with unspecified angina pectoris: Secondary | ICD-10-CM | POA: Diagnosis not present

## 2024-01-20 DIAGNOSIS — Z90710 Acquired absence of both cervix and uterus: Secondary | ICD-10-CM

## 2024-01-20 DIAGNOSIS — Z79899 Other long term (current) drug therapy: Secondary | ICD-10-CM

## 2024-01-20 DIAGNOSIS — E871 Hypo-osmolality and hyponatremia: Secondary | ICD-10-CM | POA: Diagnosis not present

## 2024-01-20 DIAGNOSIS — Z87891 Personal history of nicotine dependence: Secondary | ICD-10-CM

## 2024-01-20 DIAGNOSIS — M069 Rheumatoid arthritis, unspecified: Secondary | ICD-10-CM | POA: Diagnosis not present

## 2024-01-20 DIAGNOSIS — M1000 Idiopathic gout, unspecified site: Secondary | ICD-10-CM | POA: Diagnosis present

## 2024-01-20 DIAGNOSIS — Z886 Allergy status to analgesic agent status: Secondary | ICD-10-CM

## 2024-01-20 DIAGNOSIS — Z9049 Acquired absence of other specified parts of digestive tract: Secondary | ICD-10-CM

## 2024-01-20 DIAGNOSIS — Z955 Presence of coronary angioplasty implant and graft: Secondary | ICD-10-CM

## 2024-01-20 DIAGNOSIS — R4182 Altered mental status, unspecified: Secondary | ICD-10-CM | POA: Diagnosis not present

## 2024-01-20 DIAGNOSIS — N184 Chronic kidney disease, stage 4 (severe): Secondary | ICD-10-CM | POA: Diagnosis present

## 2024-01-20 DIAGNOSIS — A419 Sepsis, unspecified organism: Secondary | ICD-10-CM | POA: Diagnosis present

## 2024-01-20 DIAGNOSIS — Z7902 Long term (current) use of antithrombotics/antiplatelets: Secondary | ICD-10-CM

## 2024-01-20 DIAGNOSIS — I509 Heart failure, unspecified: Secondary | ICD-10-CM | POA: Diagnosis not present

## 2024-01-20 DIAGNOSIS — R918 Other nonspecific abnormal finding of lung field: Secondary | ICD-10-CM | POA: Diagnosis not present

## 2024-01-20 DIAGNOSIS — Z8673 Personal history of transient ischemic attack (TIA), and cerebral infarction without residual deficits: Secondary | ICD-10-CM

## 2024-01-20 DIAGNOSIS — M5416 Radiculopathy, lumbar region: Secondary | ICD-10-CM | POA: Diagnosis present

## 2024-01-20 DIAGNOSIS — Z888 Allergy status to other drugs, medicaments and biological substances status: Secondary | ICD-10-CM

## 2024-01-20 DIAGNOSIS — Z794 Long term (current) use of insulin: Secondary | ICD-10-CM

## 2024-01-20 DIAGNOSIS — I2721 Secondary pulmonary arterial hypertension: Secondary | ICD-10-CM | POA: Diagnosis present

## 2024-01-20 DIAGNOSIS — Z9981 Dependence on supplemental oxygen: Secondary | ICD-10-CM

## 2024-01-20 DIAGNOSIS — R339 Retention of urine, unspecified: Secondary | ICD-10-CM | POA: Diagnosis present

## 2024-01-20 DIAGNOSIS — F411 Generalized anxiety disorder: Secondary | ICD-10-CM | POA: Diagnosis not present

## 2024-01-20 DIAGNOSIS — F419 Anxiety disorder, unspecified: Secondary | ICD-10-CM | POA: Diagnosis present

## 2024-01-20 DIAGNOSIS — I071 Rheumatic tricuspid insufficiency: Secondary | ICD-10-CM | POA: Diagnosis present

## 2024-01-20 DIAGNOSIS — I252 Old myocardial infarction: Secondary | ICD-10-CM

## 2024-01-20 DIAGNOSIS — R531 Weakness: Secondary | ICD-10-CM | POA: Diagnosis not present

## 2024-01-20 DIAGNOSIS — J90 Pleural effusion, not elsewhere classified: Secondary | ICD-10-CM | POA: Diagnosis not present

## 2024-01-20 LAB — CBC
HCT: 28.6 % — ABNORMAL LOW (ref 36.0–46.0)
Hemoglobin: 9 g/dL — ABNORMAL LOW (ref 12.0–15.0)
MCH: 26.9 pg (ref 26.0–34.0)
MCHC: 31.5 g/dL (ref 30.0–36.0)
MCV: 85.6 fL (ref 80.0–100.0)
Platelets: 233 K/uL (ref 150–400)
RBC: 3.34 MIL/uL — ABNORMAL LOW (ref 3.87–5.11)
RDW: 17.5 % — ABNORMAL HIGH (ref 11.5–15.5)
WBC: 7.8 K/uL (ref 4.0–10.5)
nRBC: 0 % (ref 0.0–0.2)

## 2024-01-20 LAB — URINALYSIS, W/ REFLEX TO CULTURE (INFECTION SUSPECTED)
Bilirubin Urine: NEGATIVE
Glucose, UA: >=500 mg/dL — AB
Ketones, ur: NEGATIVE mg/dL
Nitrite: NEGATIVE
Protein, ur: 100 mg/dL — AB
Specific Gravity, Urine: 1.007 (ref 1.005–1.030)
pH: 5 (ref 5.0–8.0)

## 2024-01-20 LAB — COMPREHENSIVE METABOLIC PANEL WITH GFR
ALT: 9 U/L (ref 0–44)
AST: 12 U/L — ABNORMAL LOW (ref 15–41)
Albumin: 2.3 g/dL — ABNORMAL LOW (ref 3.5–5.0)
Alkaline Phosphatase: 88 U/L (ref 38–126)
Anion gap: 12 (ref 5–15)
BUN: 44 mg/dL — ABNORMAL HIGH (ref 6–20)
CO2: 20 mmol/L — ABNORMAL LOW (ref 22–32)
Calcium: 8.7 mg/dL — ABNORMAL LOW (ref 8.9–10.3)
Chloride: 100 mmol/L (ref 98–111)
Creatinine, Ser: 2.61 mg/dL — ABNORMAL HIGH (ref 0.44–1.00)
GFR, Estimated: 20 mL/min — ABNORMAL LOW (ref >60)
Glucose, Bld: 165 mg/dL — ABNORMAL HIGH (ref 70–99)
Potassium: 4.1 mmol/L (ref 3.5–5.1)
Sodium: 132 mmol/L — ABNORMAL LOW (ref 135–145)
Total Bilirubin: 1.1 mg/dL (ref 0.0–1.2)
Total Protein: 7.5 g/dL (ref 6.5–8.1)

## 2024-01-20 LAB — BRAIN NATRIURETIC PEPTIDE: B Natriuretic Peptide: 1398.9 pg/mL — ABNORMAL HIGH (ref 0.0–100.0)

## 2024-01-20 LAB — GLUCOSE, CAPILLARY: Glucose-Capillary: 148 mg/dL — ABNORMAL HIGH (ref 70–99)

## 2024-01-20 MED ORDER — HEPARIN SODIUM (PORCINE) 5000 UNIT/ML IJ SOLN
5000.0000 [IU] | Freq: Three times a day (TID) | INTRAMUSCULAR | Status: DC
  Administered 2024-01-20 – 2024-01-25 (×13): 5000 [IU] via SUBCUTANEOUS
  Filled 2024-01-20 (×14): qty 1

## 2024-01-20 MED ORDER — GABAPENTIN 300 MG PO CAPS: 300.0000 mg | ORAL_CAPSULE | Freq: Two times a day (BID) | ORAL | Status: DC

## 2024-01-20 MED ORDER — ASPIRIN 81 MG PO TBEC
81.0000 mg | DELAYED_RELEASE_TABLET | Freq: Every day | ORAL | Status: DC
  Administered 2024-01-23 – 2024-01-25 (×3): 81 mg via ORAL
  Filled 2024-01-20 (×4): qty 1

## 2024-01-20 MED ORDER — HYDROCODONE-ACETAMINOPHEN 7.5-325 MG PO TABS: 1.0000 | ORAL_TABLET | Freq: Four times a day (QID) | ORAL | Status: DC | PRN

## 2024-01-20 MED ORDER — INSULIN ASPART 100 UNIT/ML IJ SOLN
0.0000 [IU] | INTRAMUSCULAR | Status: DC
  Administered 2024-01-20 – 2024-01-21 (×2): 1 [IU] via SUBCUTANEOUS
  Administered 2024-01-21: 2 [IU] via SUBCUTANEOUS
  Administered 2024-01-21: 1 [IU] via SUBCUTANEOUS
  Administered 2024-01-22: 2 [IU] via SUBCUTANEOUS
  Administered 2024-01-22: 1 [IU] via SUBCUTANEOUS
  Administered 2024-01-22: 2 [IU] via SUBCUTANEOUS
  Administered 2024-01-22 (×2): 1 [IU] via SUBCUTANEOUS
  Administered 2024-01-23: 5 [IU] via SUBCUTANEOUS
  Administered 2024-01-23: 3 [IU] via SUBCUTANEOUS
  Administered 2024-01-23: 7 [IU] via SUBCUTANEOUS
  Administered 2024-01-23: 2 [IU] via SUBCUTANEOUS
  Administered 2024-01-23: 1 [IU] via SUBCUTANEOUS
  Administered 2024-01-24 (×2): 2 [IU] via SUBCUTANEOUS
  Administered 2024-01-24: 5 [IU] via SUBCUTANEOUS
  Administered 2024-01-24: 3 [IU] via SUBCUTANEOUS
  Administered 2024-01-24 – 2024-01-25 (×2): 2 [IU] via SUBCUTANEOUS
  Administered 2024-01-25 (×2): 1 [IU] via SUBCUTANEOUS

## 2024-01-20 MED ORDER — ONDANSETRON HCL 4 MG PO TABS
4.0000 mg | ORAL_TABLET | Freq: Four times a day (QID) | ORAL | Status: DC | PRN
  Administered 2024-01-23 – 2024-01-24 (×2): 4 mg via ORAL
  Filled 2024-01-20 (×2): qty 1

## 2024-01-20 MED ORDER — CARVEDILOL 6.25 MG PO TABS
6.2500 mg | ORAL_TABLET | Freq: Two times a day (BID) | ORAL | Status: DC
  Administered 2024-01-22 – 2024-01-25 (×6): 6.25 mg via ORAL
  Filled 2024-01-20 (×6): qty 1

## 2024-01-20 MED ORDER — ACETAMINOPHEN 650 MG RE SUPP
650.0000 mg | Freq: Four times a day (QID) | RECTAL | Status: DC | PRN
Start: 2024-01-20 — End: 2024-01-25

## 2024-01-20 MED ORDER — ALBUTEROL SULFATE (2.5 MG/3ML) 0.083% IN NEBU: 2.5000 mg | INHALATION_SOLUTION | Freq: Four times a day (QID) | RESPIRATORY_TRACT | Status: DC | PRN

## 2024-01-20 MED ORDER — BISACODYL 5 MG PO TBEC: 5.0000 mg | DELAYED_RELEASE_TABLET | Freq: Every day | ORAL | Status: DC | PRN

## 2024-01-20 MED ORDER — ACETAMINOPHEN 325 MG PO TABS: 650.0000 mg | ORAL_TABLET | Freq: Four times a day (QID) | ORAL | Status: DC | PRN

## 2024-01-20 MED ORDER — HYDROMORPHONE HCL 1 MG/ML IJ SOLN
0.5000 mg | INTRAMUSCULAR | Status: DC | PRN
  Administered 2024-01-21 (×2): 0.5 mg via INTRAVENOUS
  Filled 2024-01-20 (×2): qty 1

## 2024-01-20 MED ORDER — LORAZEPAM 0.5 MG PO TABS: 0.5000 mg | ORAL_TABLET | Freq: Every day | ORAL | Status: DC | PRN

## 2024-01-20 MED ORDER — TORSEMIDE 20 MG PO TABS: 20.0000 mg | ORAL_TABLET | Freq: Every day | ORAL | Status: DC

## 2024-01-20 MED ORDER — EZETIMIBE 10 MG PO TABS
10.0000 mg | ORAL_TABLET | Freq: Every day | ORAL | Status: DC
  Administered 2024-01-23 – 2024-01-25 (×3): 10 mg via ORAL
  Filled 2024-01-20 (×4): qty 1

## 2024-01-20 MED ORDER — TICAGRELOR 90 MG PO TABS
90.0000 mg | ORAL_TABLET | Freq: Two times a day (BID) | ORAL | Status: DC
  Administered 2024-01-22 – 2024-01-25 (×6): 90 mg via ORAL
  Filled 2024-01-20 (×7): qty 1

## 2024-01-20 MED ORDER — ALLOPURINOL 100 MG PO TABS
50.0000 mg | ORAL_TABLET | Freq: Every day | ORAL | Status: DC
  Administered 2024-01-23 – 2024-01-25 (×3): 50 mg via ORAL
  Filled 2024-01-20 (×4): qty 1

## 2024-01-20 MED ORDER — PANTOPRAZOLE SODIUM 40 MG PO TBEC
40.0000 mg | DELAYED_RELEASE_TABLET | Freq: Every day | ORAL | Status: DC
  Administered 2024-01-23 – 2024-01-25 (×3): 40 mg via ORAL
  Filled 2024-01-20 (×4): qty 1

## 2024-01-20 MED ORDER — HYDROCODONE-ACETAMINOPHEN 7.5-325 MG PO TABS
1.0000 | ORAL_TABLET | Freq: Three times a day (TID) | ORAL | Status: DC | PRN
  Administered 2024-01-23 – 2024-01-25 (×5): 1 via ORAL
  Filled 2024-01-20 (×5): qty 1

## 2024-01-20 MED ORDER — ALBUTEROL SULFATE HFA 108 (90 BASE) MCG/ACT IN AERS: 2.0000 | INHALATION_SPRAY | Freq: Four times a day (QID) | RESPIRATORY_TRACT | Status: DC | PRN

## 2024-01-20 MED ORDER — ONDANSETRON HCL 4 MG/2ML IJ SOLN
4.0000 mg | Freq: Four times a day (QID) | INTRAMUSCULAR | Status: DC | PRN
Start: 2024-01-20 — End: 2024-01-25
  Administered 2024-01-23 – 2024-01-25 (×3): 4 mg via INTRAVENOUS
  Filled 2024-01-20 (×3): qty 2

## 2024-01-20 MED ORDER — LORAZEPAM 2 MG/ML IJ SOLN
0.5000 mg | Freq: Every day | INTRAMUSCULAR | Status: DC | PRN
  Administered 2024-01-20: 0.5 mg via INTRAVENOUS
  Filled 2024-01-20 (×2): qty 1

## 2024-01-20 MED ORDER — BUSPIRONE HCL 5 MG PO TABS: 15.0000 mg | ORAL_TABLET | Freq: Three times a day (TID) | ORAL | Status: DC

## 2024-01-20 MED ORDER — SENNOSIDES-DOCUSATE SODIUM 8.6-50 MG PO TABS: 1.0000 | ORAL_TABLET | Freq: Every evening | ORAL | Status: DC | PRN

## 2024-01-20 MED ORDER — SODIUM CHLORIDE 0.9% FLUSH
3.0000 mL | Freq: Two times a day (BID) | INTRAVENOUS | Status: DC
  Administered 2024-01-20 – 2024-01-25 (×9): 3 mL via INTRAVENOUS

## 2024-01-20 NOTE — Progress Notes (Signed)
   01/20/24 1817  Vitals  Temp 98.5 F (36.9 C)  Temp Source Axillary  BP (!) 153/70  MAP (mmHg) 95  BP Location Left Arm  BP Method Automatic  Patient Position (if appropriate) Lying  Pulse Rate 94  Pulse Rate Source Monitor  ECG Heart Rate 95  Resp (!) 22  Level of Consciousness  Level of Consciousness Alert  MEWS COLOR  MEWS Score Color Green  Oxygen  Therapy  SpO2 100 %  Pain Assessment  Faces Pain Scale 8  Pain Type  (nonverbal)  PCA/Epidural/Spinal Assessment  Respiratory Pattern Regular;Labored  Height and Weight  Height 5' 5 (1.651 m)  Weight 93.7 kg  Type of Scale Used Bed  BSA (Calculated - sq m) 2.07 sq meters  BMI (Calculated) 34.38  Weight in (lb) to have BMI = 25 149.9  Glasgow Coma Scale  Eye Opening 3  Best Verbal Response (NON-intubated) 1  Modified Verbal Response (INTUBATED) 3  Best Motor Response 5  Glasgow Coma Scale Score 12  MEWS Score  MEWS Temp 0  MEWS Systolic 0  MEWS Pulse 0  MEWS RR 1  MEWS LOC 0  MEWS Score 1   Patient arrived from [facility] tearful and groaning when transferred from stretcher to bed. [facility] adminstered a total of 100mcg of fentanyl  for patient comfort during the transfer to 3E. Patient unable to express. Patient arrived with foley catheter. Peri care and foley care performed. CHG bath performed. TRH notified of patient's arrival.

## 2024-01-20 NOTE — Hospital Course (Signed)
 Dana [name] is a 63 y.o. female with medical history significant for CAD s/p PCI, HFmrEF, CKD stage IV, type 1 diabetes, anemia of chronic disease, depression/anxiety, chronic pain syndrome, OSA on CPAP, postoperative left ankle wound infection with MRSA bacteremia now s/p left BKA who is admitted as a transfer from [facility] for acute on chronic CHF, AKI on CKD stage IV, and altered mental status.

## 2024-01-20 NOTE — H&P (Signed)
 History and Physical    Dana [name] FMW:[id number] DOB: 15-Jul-1961 DOA: 01/20/2024  PCP: Sherre Clapper, MD  Patient coming from: Home  I have personally briefly reviewed patient's old medical records in [facility]  Chief Complaint: CHF, renal dysfunction, altered mental status  HPI: Dana [name] is a 63 y.o. female with medical history significant for CAD s/p PCI, HFmrEF, CKD stage IV, type 1 diabetes, anemia of chronic disease, depression/anxiety, chronic pain syndrome, OSA on CPAP, postoperative left ankle wound infection with MRSA bacteremia now s/p left BKA who initially presented to [facility] for shortness of breath.  Patient unable to provide any history due to altered mental status which is otherwise supplemented by EDP, chart review, and family at bedside.  Patient admitted at [facility] on 7/3 with initial diagnosis of HCAP, sepsis, CHF exacerbation.  She was placed on antibiotics including cefepime, linezolid, doxycycline.  She received diuretics on day 1.  On admission her creatinine was 1.6 and since has risen to 2.7 on 7/6 and 2.8 on 7/7.  She did receive contrast for CTA chest on admission.  Due to worsening renal function diuretics were held for the last 2 days.  BNP reportedly increased from 25,000-50,000.  Follow-up CXR showed interstitial edema.  She has been requiring 2-3 L supplemental O2 via [place].  Patient states that 2 days ago while in the hospital patient was communicating to them appropriately including telling family that she thought she might be out of the hospital soon.  Yesterday patient became very lethargic and less interactive.  Today she has not been interacting with her family or anyone else but instead has been essentially crying nonstop.  She has not been communicating her needs or indicating any specific pain.  She has not been eating or taking her medications these last 2 days.  Patient with recent prolonged hospitalization from  5/31-6/13 for severe sepsis secondary to postoperative left ankle infection and MRSA bacteremia.  She underwent left BKA on 6/4.  TEE was negative for vegetation.  She completed 2 weeks IV daptomycin .  She was seen by psychiatry for significant depression and anxiety related to undergoing the amputation.  Marcey was discharged to [facility] on 6/13 and ultimately discharged back to home on 6/26.  [facility] ED Course  Labs/Imaging on admission: I have personally reviewed following labs and imaging studies.  CTA chest 01/17/2024: IMPRESSION: 1. Cardiac enlargement. Mitra-clip in place. Pulmonary arterial hypertension. CardioMEMS device in place. 2. No evidence of acute or chronic pulmonary embolus. 3. No aortic dissection or aneurysm. 4. Interstitial edema and early alveolar edema. 5. Shotty mediastinal lymph nodes are likely reactive   Renal ultrasound 01/19/2024: IMPRESSION: Mild medical renal disease. Foley in the urinary bladder.  CT head without contrast 01/20/2024: IMPRESSION: 1. No intracranial hemorrhage or ischemia at this time. Small focus of encephalomalacia on the right at the external capsule. Stable. 2. Acute on chronic sinusitis. This has worsened.  Review of Systems: All systems reviewed and are negative except as documented in history of present illness above.   Past Medical History:  Diagnosis Date   Acquired absence of left great toe (HCC)    Acquired absence of other left toe(s) (HCC)    ACS (acute coronary syndrome) (HCC)    Acute bronchitis due to COVID-19 virus 03/03/2023   Acute on chronic combined systolic and diastolic CHF (congestive heart failure) (HCC) 09/16/2023   Acute respiratory failure with hypoxia (HCC) 07/20/2023   Anemia    Anoxic  brain injury [facility])    Anxiety    CAD (coronary artery disease)    DES mLAD 04/07/15; DES dRCA & DES pPDA 08/30/16; DES mCX & PTCA OM1 09/29/20   Charcot's joint of foot in type 2 diabetes mellitus (HCC) 12/15/2023   Chronic  diastolic (congestive) heart failure (HCC)    Chronic pain    Chronic systolic (congestive) heart failure (HCC)    CKD (chronic kidney disease)    Contrast dye induced nephropathy, possible 09/03/2020   COPD (chronic obstructive pulmonary disease) (HCC)    COVID-19 virus infection 03/03/2023   Demand ischemia (HCC)    Diabetes (HCC)type 1    Diastolic CHF (HCC)    Dyspnea    Encounter for screening mammogram for malignant neoplasm of breast 09/16/2023   Family history of breast cancer 10/23/2021   Gastro-esophageal reflux disease without esophagitis    Hyperlipidemia    Hypertension    Hypertensive heart disease with heart failure (HCC)    Hypoxemia    Idiopathic gout, multiple sites    Leukocytosis 03/29/2022   Localized edema    Low back pain    Mitral regurgitation    Mitral regurgitation    Mixed hyperlipidemia    Mixed incontinence    Morbid (severe) obesity due to excess calories (HCC)    Neuromuscular disorder (HCC)    Neuropathy    Non-ST elevation (NSTEMI) myocardial infarction (HCC)    08/29/20, 09/29/20   OSA (obstructive sleep apnea) 09/03/2020   Other specified hypothyroidism    PAF (paroxysmal atrial fibrillation) (HCC)    s/p pulmonary vein isolation by cryoablation 10/04/15 Dr. Meldon in [facility]   PEA (Pulseless electrical activity) [facility])    ~ 01/13/21 at [facility] during admission DKA, volume overload, possible CAP, hypoxia s/p ACLS with ROSC ~ 10-15   Pneumonia    Primary insomnia    Rheumatoid arthritis (HCC)    Stroke (HCC)    Thyroid  disease    Type 1 diabetes mellitus (HCC)    URI with cough and congestion 08/22/2023   Vitamin D  deficiency     Past Surgical History:  Procedure Laterality Date   ABDOMINAL HYSTERECTOMY     Partial- Still has both ovaries   AMPUTATION Left 12/18/2023   Procedure: AMPUTATION BELOW KNEE;  Surgeon: Harden Jerona GAILS, MD;  Location: [facility] OR;  Service: Orthopedics;  Laterality: Left;   ANKLE FUSION Left 11/13/2023    Procedure: HINDFOOT FUSION FIXATION OF ANKLE;  Surgeon: Kendal Franky SQUIBB, MD;  Location: [facility] OR;  Service: Orthopedics;  Laterality: Left;   CARDIOVASCULAR STRESS TEST  08/13/2014   Nuclear; Normal   CARPAL TUNNEL RELEASE     CATARACT EXTRACTION, BILATERAL     CESAREAN SECTION     CHOLECYSTECTOMY     CORONARY ANGIOPLASTY WITH STENT PLACEMENT     3 blockages/ 1 stent   CORONARY BALLOON ANGIOPLASTY N/A 07/18/2023   Procedure: CORONARY BALLOON ANGIOPLASTY;  Surgeon: Verlin Lonni BIRCH, MD;  Location: [facility] INVASIVE CV LAB;  Service: Cardiovascular;  Laterality: N/A;   CORONARY PRESSURE/FFR STUDY N/A 09/07/2022   Procedure: INTRAVASCULAR PRESSURE WIRE/FFR STUDY;  Surgeon: Swaziland, Peter M, MD;  Location: [facility] INVASIVE CV LAB;  Service: Cardiovascular;  Laterality: N/A;   CORONARY STENT INTERVENTION N/A 09/29/2020   Procedure: CORONARY STENT INTERVENTION;  Surgeon: Wonda Sharper, MD;  Location: [facility] INVASIVE CV LAB;  Service: Cardiovascular;  Laterality: N/A;  CFX   CORONARY STENT INTERVENTION N/A 09/07/2022   Procedure: CORONARY STENT INTERVENTION;  Surgeon:  Swaziland, Peter M, MD;  Location: [facility] INVASIVE CV LAB;  Service: Cardiovascular;  Laterality: N/A;   CORONARY STENT INTERVENTION N/A 02/13/2023   Procedure: CORONARY STENT INTERVENTION;  Surgeon: Verlin Lonni BIRCH, MD;  Location: [facility] INVASIVE CV LAB;  Service: Cardiovascular;  Laterality: N/A;  LAD   CORONARY ULTRASOUND/IVUS N/A 02/13/2023   Procedure: Coronary Ultrasound/IVUS;  Surgeon: Verlin Lonni BIRCH, MD;  Location: [facility] INVASIVE CV LAB;  Service: Cardiovascular;  Laterality: N/A;   CORONARY/GRAFT ACUTE MI REVASCULARIZATION N/A 12/08/2022   Procedure: Coronary/Graft Acute MI Revascularization;  Surgeon: Anner Alm ORN, MD;  Location: [facility] INVASIVE CV LAB;  Service: Cardiovascular;  Laterality: N/A;   EYE SURGERY     LEFT HEART CATH AND CORONARY ANGIOGRAPHY N/A 09/29/2020   Procedure: LEFT HEART CATH AND CORONARY ANGIOGRAPHY;   Surgeon: Wonda Sharper, MD;  Location: [facility] INVASIVE CV LAB;  Service: Cardiovascular;  Laterality: N/A;   LEFT HEART CATH AND CORONARY ANGIOGRAPHY N/A 09/07/2022   Procedure: LEFT HEART CATH AND CORONARY ANGIOGRAPHY;  Surgeon: Swaziland, Peter M, MD;  Location: [facility] INVASIVE CV LAB;  Service: Cardiovascular;  Laterality: N/A;   LEFT HEART CATH AND CORONARY ANGIOGRAPHY N/A 12/08/2022   Procedure: LEFT HEART CATH AND CORONARY ANGIOGRAPHY;  Surgeon: Anner Alm ORN, MD;  Location: [facility] INVASIVE CV LAB;  Service: Cardiovascular;  Laterality: N/A;   LEFT HEART CATH AND CORONARY ANGIOGRAPHY N/A 02/13/2023   Procedure: LEFT HEART CATH AND CORONARY ANGIOGRAPHY;  Surgeon: Verlin Lonni BIRCH, MD;  Location: [facility] INVASIVE CV LAB;  Service: Cardiovascular;  Laterality: N/A;   LEFT HEART CATH AND CORONARY ANGIOGRAPHY N/A 07/16/2023   Procedure: LEFT HEART CATH AND CORONARY ANGIOGRAPHY;  Surgeon: Verlin Lonni BIRCH, MD;  Location: [facility] INVASIVE CV LAB;  Service: Cardiovascular;  Laterality: N/A;   RIGHT HEART CATH N/A 04/27/2021   Procedure: RIGHT HEART CATH;  Surgeon: Cherrie Toribio SAUNDERS, MD;  Location: [facility] INVASIVE CV LAB;  Service: Cardiovascular;  Laterality: N/A;   RIGHT/LEFT HEART CATH AND CORONARY ANGIOGRAPHY N/A 01/07/2017   Procedure: Right/Left Heart Cath and Coronary Angiography;  Surgeon: Rolan Ezra RAMAN, MD;  Location: [facility] INVASIVE CV LAB;  Service: Cardiovascular;  Laterality: N/A;   ROTATOR CUFF REPAIR     TEE WITHOUT CARDIOVERSION N/A 04/28/2021   Procedure: TRANSESOPHAGEAL ECHOCARDIOGRAM (TEE);  Surgeon: Cherrie Toribio SAUNDERS, MD;  Location: [facility] ENDOSCOPY;  Service: Cardiovascular;  Laterality: N/A;   TEE WITHOUT CARDIOVERSION N/A 05/18/2021   Procedure: TRANSESOPHAGEAL ECHOCARDIOGRAM (TEE);  Surgeon: Thukkani, Arun K, MD;  Location: [facility] INVASIVE CV LAB;  Service: Cardiovascular;  Laterality: N/A;   TOE AMPUTATION Left    TRANSCATHETER MITRAL EDGE TO EDGE REPAIR N/A 05/18/2021   Procedure: MITRAL  VALVE REPAIR;  Surgeon: Wendel Lurena POUR, MD;  Location: [facility] INVASIVE CV LAB;  Service: Cardiovascular;  Laterality: N/A;   TRANSESOPHAGEAL ECHOCARDIOGRAM (CATH LAB) N/A 12/23/2023   Procedure: TRANSESOPHAGEAL ECHOCARDIOGRAM;  Surgeon: Kate Lonni CROME, MD;  Location: [facility] INVASIVE CV LAB;  Service: Cardiovascular;  Laterality: N/A;   US  ECHOCARDIOGRAPHY  05/2017   Normal    Social History: Social History   Tobacco Use   Smoking status: Former    Current packs/day: 0.00    Types: Cigarettes    Start date: 10    Quit date: 1984    Years since quitting: 41.5    Passive exposure: Past   Smokeless tobacco: Never  Vaping Use   Vaping status: Never Used  Substance Use Topics   Alcohol use: Never   Drug use: Never  Allergies  Allergen Reactions   Naproxen Hives and Rash   Shellfish-Derived Products Hives, Swelling and Other (See Comments)    Angioedema    Strawberry (Diagnostic) Hives   Celecoxib Other (See Comments)    Stomach pain   Doxepin  Hcl Other (See Comments)    Lethargic, sleeping a lot per pt and family   Glucosamine Hives, Swelling and Other (See Comments)    Angioedema    Iron  Hives and Other (See Comments)    IV iron  transfusion - hives - Feb 2022 hospitalization  Patient said she CAN tolerate if given Benadryl  first   Metoclopramide Other (See Comments)    Facial twitching and stuttering   Metolazone  Other (See Comments)    Acute renal failure   Statins Other (See Comments)    Muscle pain    Family History  Problem Relation Age of Onset   Breast cancer Mother 44       contralateral breast ca dx 80   Coronary artery disease Mother    Hypertension Mother    Hyperlipidemia Mother    Diabetes type II Mother    Lung cancer Mother 1   Diabetes Father    Hypertension Father    Mental illness Sister    Bipolar disorder Other    Depression Other    Schizophrenia Other    Cancer Other        MGF's sisters x2; unknown type     Prior to  Admission medications   Medication Sig Start Date End Date Taking? Authorizing Provider  acetaminophen  (TYLENOL ) 500 MG tablet Take 1,000 mg by mouth every 6 (six) hours as needed for mild pain (pain score 1-3) or headache.    [provider]  albuterol  (VENTOLIN  HFA) 108 (90 Base) MCG/ACT inhaler Inhale 2 puffs into the lungs every 6 (six) hours as needed for wheezing or shortness of breath. 01/16/24   Milon Cleaves, PA  allopurinol  (ZYLOPRIM ) 100 MG tablet Take 0.5 tablets (50 mg total) by mouth daily. 01/08/24   Angiulli, Toribio PARAS, PA-C  aspirin  EC 81 MG tablet Take 1 tablet (81 mg total) by mouth daily. Swallow whole. 08/13/23   CoxAbigail, MD  busPIRone  (BUSPAR ) 15 MG tablet Take 1 tablet (15 mg total) by mouth 3 (three) times daily. 01/08/24   Angiulli, Toribio PARAS, PA-C  carvedilol  (COREG ) 6.25 MG tablet Take 6.25 mg by mouth 2 (two) times daily with a meal.    [provider]  cephALEXin  (KEFLEX ) 500 MG capsule Take 1 capsule (500 mg total) by mouth 2 (two) times daily. 01/08/24   Angiulli, Toribio PARAS, PA-C  Continuous Glucose Sensor (FREESTYLE LIBRE 2 SENSOR) MISC Inject 1 device into skin every 14 (fourteen) days. 11/15/23   Teressa Harrie HERO, FNP  diclofenac  Sodium (VOLTAREN ) 1 % GEL Apply 2 g topically 4 (four) times daily. 01/08/24   Angiulli, Toribio PARAS, PA-C  Evolocumab  (REPATHA  SURECLICK) 140 MG/ML SOAJ Inject 140 mg into the skin every 14 (fourteen) days. 04/29/23   CoxAbigail, MD  ezetimibe  (ZETIA ) 10 MG tablet Take 1 tablet (10 mg total) by mouth daily. 01/08/24   Angiulli, Toribio PARAS, PA-C  gabapentin  (NEURONTIN ) 300 MG capsule Take 1 capsule (300 mg total) by mouth 2 (two) times daily. 01/08/24   Angiulli, Toribio PARAS, PA-C  Glucagon  (GVOKE HYPOPEN  2-PACK) 0.5 MG/0.1ML SOAJ Inject 0.5 mg into the skin daily as needed. Patient taking differently: Inject 0.5 mg into the skin daily as needed (hypoglycemia). 07/12/23   Sherre Abigail,  MD  insulin  aspart (NOVOLOG  FLEXPEN) 100 UNIT/ML FlexPen  Inject 2 - 10 Units before meals based on SSI. 04/29/23   Cox, Kirsten, MD  insulin  glargine (LANTUS ) 100 UNIT/ML Solostar Pen Inject 5 Units into the skin daily. Expires 28 days after first use 01/08/24   Angiulli, Toribio PARAS, PA-C  Insulin  Pen Needle (BD PEN NEEDLE NANO 2ND GEN) 32G X 4 MM MISC Inject 1 each into the skin in the morning, at noon, in the evening, and at bedtime. 08/30/21   Sherre Clapper, MD  Insulin  Pen Needle 32G X 4 MM MISC Use with Lantus  pen 01/08/24   Angiulli, Toribio PARAS, PA-C  latanoprost  (XALATAN ) 0.005 % ophthalmic solution Place 1 drop into both eyes at bedtime. 08/14/23   Cox, Clapper, MD  levocetirizine (XYZAL ) 5 MG tablet Take 1 tablet (5 mg total) by mouth every evening. 01/16/24   Milon Cleaves, PA  lidocaine  4 % Place 1 patch onto the skin daily. Remove & Discard patch within 12 hours or as directed by MD 01/08/24   Angiulli, Toribio PARAS, PA-C  linaclotide  (LINZESS ) 145 MCG CAPS capsule Take 1 capsule (145 mcg total) by mouth daily before breakfast. 01/08/24   Angiulli, Toribio PARAS, PA-C  LORazepam  (ATIVAN ) 0.5 MG tablet Take 1 tablet (0.5 mg total) by mouth daily as needed for anxiety. 01/08/24   Angiulli, Toribio PARAS, PA-C  magnesium  oxide (MAG-OX) 400 MG tablet Take 2 tablets (800 mg total) by mouth daily. Take 2 (400 mg) daily=800 mg 01/08/24   Angiulli, Toribio PARAS, PA-C  Menthol -Methyl Salicylate  (ICY HOT EXTRA STRENGTH) 10-30 % CREA Apply 1 Application topically as needed (bilateral hips and knees). 10/16/23   Nicholaus Credit, PA-C  nitroGLYCERIN  (NITROSTAT ) 0.4 MG SL tablet DISSOLVE ONE TABLET UNDER THE TONGUE EVERY 5 MINUTES AS NEEDED FOR CHEST PAIN.  DO NOT EXCEED A TOTAL OF 3 DOSES IN 15 MINUTES 04/29/23   Cox, Kirsten, MD  Omega-3 Fatty Acids (FISH OIL PO) Take 1 capsule by mouth daily.    [provider]  oxyCODONE  (ROXICODONE ) 5 MG immediate release tablet Take 1 tablet (5 mg total) by mouth every 4 (four) hours as needed for severe pain (pain score 7-10). 01/08/24   Angiulli,  Toribio PARAS, PA-C  OXYGEN  Inhale 2 L/min into the lungs at bedtime.    [provider]  pantoprazole  (PROTONIX ) 40 MG tablet Take 1 tablet (40 mg total) by mouth daily. 01/08/24   Angiulli, Toribio PARAS, PA-C  polyethylene glycol powder (GLYCOLAX /MIRALAX ) 17 GM/SCOOP powder Take 1 capful (17 g) with water by mouth daily. 12/28/23   Danford, Lonni SQUIBB, MD  potassium chloride  SA (KLOR-CON  M) 20 MEQ tablet Take 1 tablet (20 mEq total) by mouth daily. 01/09/24   Angiulli, Toribio PARAS, PA-C  Probiotic CAPS Take 1 capsule by mouth daily.    [provider]  ticagrelor  (BRILINTA ) 90 MG TABS tablet Take 1 tablet (90 mg total) by mouth 2 (two) times daily. 01/08/24   Angiulli, Toribio PARAS, PA-C  torsemide  (DEMADEX ) 20 MG tablet Take 1 tablet (20 mg total) by mouth daily. 01/09/24   Angiulli, Toribio PARAS, PA-C  vitamin D3 (CHOLECALCIFEROL ) 25 MCG tablet Take 2 tablets (2,000 Units total) by mouth 2 (two) times daily. 11/14/23 02/12/24  Danton Lauraine LABOR, PA-C    Physical Exam: Vitals:   01/20/24 1817 01/20/24 2110  BP: (!) 153/70 (!) 162/100  Pulse: 94 93  Resp: (!) 22 (!) 23  Temp: 98.5 F (36.9 C) 98.2 F (36.8 C)  TempSrc: Axillary Axillary  SpO2: 100% 96%  Weight: 93.7 kg   Height: 5' 5 (1.651 m)   Exam limited due to encephalopathy/cooperation Constitutional: Patient mostly halfway sitting up facing her left on exam, nonstop crying, does not acknowledge to voice or physical contact, not communicating Eyes:  lids and conjunctivae normal ENMT: Mucous membranes are moist. Posterior pharynx clear of any exudate or lesions.Normal dentition.  Neck: normal, supple, no masses. Respiratory: clear to auscultation bilaterally, no wheezing, no crackles. Normal respiratory effort while on 2 L O2 via [place]. No accessory muscle use.  Cardiovascular: Regular rate and rhythm, no murmurs / rubs / gallops. No extremity edema.  Abdomen: no obvious tenderness to palpation, no masses palpated. Musculoskeletal: no  clubbing / cyanosis.  S/p left BKA.  Surgical site without erythema, fluctuance, discharge Skin: no rashes, lesions, ulcers. No induration Neurologic: Moving all extremities equally, not following commands Psychiatric: Does not acknowledge others in room, nonstop crying, not communicating verbally or otherwise  EKG: Ordered and pending.  Assessment/Plan Principal Problem:   Altered mental status Active Problems:   Acute on chronic combined systolic and diastolic congestive heart failure (HCC)   Anemia in CKD (chronic kidney disease)   OSA on CPAP   Uncontrolled type 1 diabetes mellitus with hyperglycemia, with long-term current use of insulin  (HCC)   CKD stage 4 due to type 1 diabetes mellitus (HCC) - baseline 2.2 - 2.4   Coronary artery disease involving native coronary artery of native heart with angina pectoris (HCC)   Depression with anxiety   Dana [name] is a 63 y.o. female with medical history significant for CAD s/p PCI, HFmrEF, CKD stage IV, type 1 diabetes, anemia of chronic disease, depression/anxiety, chronic pain syndrome, OSA on CPAP, postoperative left ankle wound infection with MRSA bacteremia now s/p left BKA who is admitted as a transfer from [facility] for acute on chronic CHF, AKI on CKD stage IV, and altered mental status.  Assessment and Plan: Altered mental status: Per family patient had significant change in mental status starting 7/6 with significant somnolence and decreased level of interaction.  Today has been essentially crying nonstop.  Does not acknowledge anyone else in the room.  Not speaking or following commands but sits up and turns away on her own.  No clear focal deficit.  Although she was treated with antibiotics at OSH there is no obvious infectious process.  She did receive cefepime and adverse reaction is on differential as well as psychiatric disorder. - Will get repeat labs with CMP, CBC - Obtain blood cultures, UA - CT head 7/7 at OSH  negative for acute process -Hold off on further antibiotics for now without obvious infectious process - Resume home BuSpar  and Ativan  0.5 mg daily prn  Acute on chronic combined systolic and diastolic CHF exacerbation Right heart failure History of MR s/p MitraClip 2022: Interstitial edema on imaging.  BNP is elevated.  Otherwise does not appear significantly volume overloaded. - Repeat CXR in a.m. - Resume torsemide  20 mg daily starting tomorrow - Continue Coreg  6.25 mg twice daily - Strict I/O's and daily weights  Acute kidney injury superimposed on CKD stage IV: Baseline creatinine is ~2.2-2.4.  Creatinine peaked at 2.8 at OSH, repeat is 2.6 on admission.  She did receive contrast with CTA chest on 7/4.  Has been off diuretics the last couple days due to rising creatinine.  Renal US  7/6 showed mild medical renal disease. - Restart torsemide  tomorrow - Continue to trend labs  Foley catheter in place, POA: History of urinary retention, unclear when this Foley was placed.  Type 1 diabetes: Placed on sliding scale insulin .  Patient has not been eating last 2 days.  Adjust insulin  as needed.  Anemia of chronic disease: Hemoglobin stable at 9.0.  CAD s/p multiple PCI/stenting: Most recent balloon angioplasty 1-25.  Continue aspirin , Brilinta , Zetia .  Not on statin due to myalgia.  Depression/anxiety: As above, question of significant depression contributing to change in mentation.  Resume BuSpar  and Ativan .  Had been on Lexapro  started on recent admission with benefit however was discontinued due to hyponatremia.  Chronic pain and lumbar radiculopathy: PDMP reviewed, patient has been on chronic Norco 7.5-325 mg #90 30 day supply as prescribed by her PCP. - Continue Norco and gabapentin   OSA on CPAP: Resume CPAP as tolerated.  Gout: Continue allopurinol .   DVT prophylaxis: heparin  injection 5,000 Units Start: 01/20/24 2200 Code Status: Full code, discussed with family on  admission Family Communication: Husband and daughter at bedside Disposition Plan: Pending clinical progress Consults called: None Severity of Illness: The appropriate patient status for this patient is INPATIENT. Inpatient status is judged to be reasonable and necessary in order to provide the required intensity of service to ensure the patient's safety. The patient's presenting symptoms, physical exam findings, and initial radiographic and laboratory data in the context of their chronic comorbidities is felt to place them at high risk for further clinical deterioration. Furthermore, it is not anticipated that the patient will be medically stable for discharge from the hospital within 2 midnights of admission.   * I certify that at the point of admission it is my clinical judgment that the patient will require inpatient hospital care spanning beyond 2 midnights from the point of admission due to high intensity of service, high risk for further deterioration and high frequency of surveillance required.DEWAINE Jorie Blanch MD [facility]  If 7PM-7AM, please contact night-coverage [email or web address]  01/20/2024, 10:04 PM

## 2024-01-21 ENCOUNTER — Inpatient Hospital Stay (HOSPITAL_COMMUNITY)

## 2024-01-21 ENCOUNTER — Ambulatory Visit: Admitting: Family Medicine

## 2024-01-21 DIAGNOSIS — R4182 Altered mental status, unspecified: Secondary | ICD-10-CM | POA: Diagnosis not present

## 2024-01-21 LAB — CBC
HCT: 27.4 % — ABNORMAL LOW (ref 36.0–46.0)
Hemoglobin: 8.6 g/dL — ABNORMAL LOW (ref 12.0–15.0)
MCH: 27.2 pg (ref 26.0–34.0)
MCHC: 31.4 g/dL (ref 30.0–36.0)
MCV: 86.7 fL (ref 80.0–100.0)
Platelets: 232 K/uL (ref 150–400)
RBC: 3.16 MIL/uL — ABNORMAL LOW (ref 3.87–5.11)
RDW: 17.4 % — ABNORMAL HIGH (ref 11.5–15.5)
WBC: 9.1 K/uL (ref 4.0–10.5)
nRBC: 0 % (ref 0.0–0.2)

## 2024-01-21 LAB — MRSA NEXT GEN BY PCR, NASAL: MRSA by PCR Next Gen: NOT DETECTED

## 2024-01-21 LAB — AMMONIA: Ammonia: 13 umol/L (ref 9–35)

## 2024-01-21 LAB — BASIC METABOLIC PANEL WITH GFR
Anion gap: 11 (ref 5–15)
BUN: 43 mg/dL — ABNORMAL HIGH (ref 6–20)
CO2: 20 mmol/L — ABNORMAL LOW (ref 22–32)
Calcium: 8.6 mg/dL — ABNORMAL LOW (ref 8.9–10.3)
Chloride: 103 mmol/L (ref 98–111)
Creatinine, Ser: 2.37 mg/dL — ABNORMAL HIGH (ref 0.44–1.00)
GFR, Estimated: 23 mL/min — ABNORMAL LOW (ref >60)
Glucose, Bld: 110 mg/dL — ABNORMAL HIGH (ref 70–99)
Potassium: 4 mmol/L (ref 3.5–5.1)
Sodium: 134 mmol/L — ABNORMAL LOW (ref 135–145)

## 2024-01-21 LAB — GLUCOSE, CAPILLARY
Glucose-Capillary: 106 mg/dL — ABNORMAL HIGH (ref 70–99)
Glucose-Capillary: 115 mg/dL — ABNORMAL HIGH (ref 70–99)
Glucose-Capillary: 142 mg/dL — ABNORMAL HIGH (ref 70–99)
Glucose-Capillary: 150 mg/dL — ABNORMAL HIGH (ref 70–99)
Glucose-Capillary: 153 mg/dL — ABNORMAL HIGH (ref 70–99)
Glucose-Capillary: 88 mg/dL (ref 70–99)

## 2024-01-21 LAB — MAGNESIUM: Magnesium: 2 mg/dL (ref 1.7–2.4)

## 2024-01-21 MED ORDER — FUROSEMIDE 10 MG/ML IJ SOLN
40.0000 mg | Freq: Two times a day (BID) | INTRAMUSCULAR | Status: DC
  Administered 2024-01-21 – 2024-01-22 (×3): 40 mg via INTRAVENOUS
  Filled 2024-01-21 (×3): qty 4

## 2024-01-21 MED ORDER — CHLORHEXIDINE GLUCONATE CLOTH 2 % EX PADS
6.0000 | MEDICATED_PAD | Freq: Every day | CUTANEOUS | Status: DC
  Administered 2024-01-21 – 2024-01-25 (×5): 6 via TOPICAL

## 2024-01-21 MED ORDER — GABAPENTIN 300 MG PO CAPS
300.0000 mg | ORAL_CAPSULE | Freq: Every day | ORAL | Status: DC
  Administered 2024-01-23 – 2024-01-24 (×2): 300 mg via ORAL
  Filled 2024-01-21 (×3): qty 1

## 2024-01-21 MED ORDER — BUSPIRONE HCL 10 MG PO TABS
10.0000 mg | ORAL_TABLET | Freq: Three times a day (TID) | ORAL | Status: DC
  Administered 2024-01-22 – 2024-01-25 (×8): 10 mg via ORAL
  Filled 2024-01-21 (×9): qty 1

## 2024-01-21 MED ORDER — INSULIN GLARGINE-YFGN 100 UNIT/ML ~~LOC~~ SOLN
5.0000 [IU] | Freq: Every day | SUBCUTANEOUS | Status: DC
  Administered 2024-01-21 – 2024-01-25 (×5): 5 [IU] via SUBCUTANEOUS
  Filled 2024-01-21 (×5): qty 0.05

## 2024-01-21 MED ORDER — HYDRALAZINE HCL 20 MG/ML IJ SOLN
10.0000 mg | Freq: Four times a day (QID) | INTRAMUSCULAR | Status: DC
  Administered 2024-01-21 – 2024-01-23 (×9): 10 mg via INTRAVENOUS
  Filled 2024-01-21 (×10): qty 1

## 2024-01-21 NOTE — Progress Notes (Signed)
 Heart Failure Navigator Progress Note  Assessed for Heart & Vascular TOC clinic readiness.  Patient does not meet criteria due to Advanced Heart Failure Team patient of Dr. Gala Romney. .   Navigator will sign off at this time.   Rhae Hammock, BSN, Scientist, clinical (histocompatibility and immunogenetics) Only

## 2024-01-21 NOTE — Progress Notes (Signed)
 PROGRESS NOTE    Dana [name]  FMW:[id number] DOB: 1961-04-25 DOA: 01/20/2024 PCP: Sherre Clapper, MD   63F chronically ill with type 1 diabetes, CAD s/p PCI, HFmrEF, CKD stage IV, type 1 diabetes, anemia of chronic disease, depression/anxiety, chronic pain syndrome, OSA on CPAP, postoperative left ankle wound infection with MRSA bacteremia now s/p left BKA who initially presented to [facility] for shortness of breath.  Patient unable to provide any history and cries incessantly. - Recent prolonged hospitalization 5/31-6/13 for severe sepsis, MRSA bacteremia left ankle infection underwent left BKA, TEE was negative for endocarditis completed 2 weeks of IV daptomycin , seen by psych for significant anxiety and depression related to amputation, discharged to CIR on 6/13 and then went home 6/26 -Presented to [facility] on 7/3 with worsening dyspnea and cough, suspected to be from CHF and or pneumonia, treated with cefepime linezolid doxycycline and diuretics, creatinine on admission went from 1.6-2.7, 2.8, subsequently diuretics were held for 2 days, BNP increased to 50K, follow-up x-ray noted interstitial edema, 2 to 3 days ago was also noted to be more tired sleepy, less interactive and then 36 hours ago started crying incessantly, not community stating her needs are indicating pain or other symptoms, not eating or taking meds. -Had a Foley catheter placed few days ago for retention - Transferred to [facility] yesterday evening and admitted by Dr. Tobie   Studies at [facility]: CTA chest 01/17/2024: IMPRESSION: 1. Cardiac enlargement. Mitra-clip in place. Pulmonary arterial hypertension. CardioMEMS device in place. 2. No evidence of acute or chronic pulmonary embolus. 3. No aortic dissection or aneurysm. 4. Interstitial edema and early alveolar edema. 5. Shotty mediastinal lymph nodes are likely reactive    Renal ultrasound 01/19/2024: IMPRESSION: Mild medical renal disease. Foley in the urinary  bladder.   CT head without contrast 01/20/2024: IMPRESSION: 1. No intracranial hemorrhage or ischemia at this time. Small focus of encephalomalacia on the right at the external capsule. Stable. 2. Acute on chronic sinusitis. This has worsened.   Subjective: -Sleeping in the room, when I woke her up she started crying immediately, would not interact or answer questions, eyes were open, somber appearing and crying  Assessment and Plan:  Encephalopathy Per family patient had significant change in mental status starting 7/6 with significant somnolence and decreased level of interaction.  Since Sunday night has been essentially crying nonstop.  Does not acknowledge anyone else in the room.  Not speaking or following commands but sits up and turns away on her own.  No clear focal deficit.   - She was treated with IV antibiotics initially at outside hospital but no obvious infectious process noted at this time, psychiatric illness is a possibility, also did briefly receive cefepime while at [facility] which may be contributing - Monitor off antibiotics, also check MRI brain to rule out new frontal lobe process - UA, chest x-ray unremarkable for infectious process, Foley catheter placed 7/6 for retention at [facility] - Check ammonia level, follow-up blood cultures - If she starts talking a little would benefit from psych eval   Acute on chronic combined systolic and diastolic CHF exacerbation Right heart failure History of MR s/p MitraClip 2022: Interstitial edema on imaging.  BNP is elevated.  -Appears volume overloaded, restart IV Lasix  today, continue Coreg    Acute kidney injury superimposed on CKD stage IV: Baseline creatinine is ~2.2-2.4.  Creatinine peaked at 2.8 at OSH, repeat is 2.6 on admission.  She did receive contrast with CTA chest on 7/4.  Renal US  7/6 showed mild medical renal disease. - Restart IV Lasix  today, see discussion above, during recent prior hospitalization at [facility] in rehab  creatinine peaked at 3.7 during recent hospitalization and improved to 3.2 at discharge, subsequently has been improving - Continue to trend labs   Foley catheter in place, POA: -per family placed about 48 hours ago for retention   Type 1 diabetes: Placed on sliding scale insulin .  Poor oral intake - Add low-dose Semglee  and continue SSI   Anemia of chronic disease: Hemoglobin stable at 9.0.   CAD s/p multiple PCI/stenting: Most recent balloon angioplasty 1-25.  Continue aspirin , Brilinta , Zetia .  Not on statin due to myalgia.   Depression/anxiety: As above, question of significant depression contributing to change in mentation.  Resume BuSpar  and Ativan  PRN.  Had been on Lexapro  started on recent admission with benefit however was discontinued due to hyponatremia.   Chronic pain and lumbar radiculopathy: PDMP reviewed, patient has been on chronic Norco 7.5-325 mg #90 30 day supply as prescribed by her PCP. - Continue Norco and gabapentin    OSA on CPAP: Resume CPAP as tolerated.   Gout: Continue allopurinol .   DVT prophylaxis: heparin  injection 5,000 Units Start: 01/20/24 2200 Code Status: Full code, discussed with family on admission Family Communication: Spouse at bedside, called and updated daughter from the room Disposition Plan: Pending clinical progress  Consultants:    Procedures:   Antimicrobials:    Objective: Vitals:   01/21/24 0341 01/21/24 0358 01/21/24 0802 01/21/24 1132  BP: (!) 172/86  (!) 170/76 (!) 161/73  Pulse: 84  87 97  Resp: 17  18 (!) 22  Temp: 98 F (36.7 C)  98.4 F (36.9 C) 98.7 F (37.1 C)  TempSrc: Axillary  Axillary Axillary  SpO2: 98%  96% 98%  Weight:  93.3 kg    Height:        Intake/Output Summary (Last 24 hours) at 01/21/2024 1150 Last data filed at 01/21/2024 0804 Gross per 24 hour  Intake --  Output 1050 ml  Net -1050 ml   Filed Weights   01/20/24 1817 01/21/24 0358  Weight: 93.7 kg 93.3 kg     Examination:  General exam: Appears calm and comfortable  Respiratory system: Clear to auscultation Cardiovascular system: S1 & S2 heard, RRR.  Abd: nondistended, soft and nontender.Normal bowel sounds heard. Central nervous system: Alert and oriented. No focal neurological deficits. Extremities: no edema Skin: No rashes Psychiatry:  Mood & affect appropriate.     Data Reviewed:   CBC: Recent Labs  Lab 01/20/24 1837 01/21/24 0242  WBC 7.8 9.1  HGB 9.0* 8.6*  HCT 28.6* 27.4*  MCV 85.6 86.7  PLT 233 232   Basic Metabolic Panel: Recent Labs  Lab 01/20/24 1837 01/21/24 0242  NA 132* 134*  K 4.1 4.0  CL 100 103  CO2 20* 20*  GLUCOSE 165* 110*  BUN 44* 43*  CREATININE 2.61* 2.37*  CALCIUM  8.7* 8.6*  MG  --  2.0   GFR: Estimated Creatinine Clearance: 28.5 mL/min (A) (by C-G formula based on SCr of 2.37 mg/dL (H)). Liver Function Tests: Recent Labs  Lab 01/20/24 1837  AST 12*  ALT 9  ALKPHOS 88  BILITOT 1.1  PROT 7.5  ALBUMIN  2.3*   No results for input(s): LIPASE, AMYLASE in the last 168 hours. No results for input(s): AMMONIA in the last 168 hours. Coagulation Profile: No results for input(s): INR, PROTIME in the last 168 hours. Cardiac Enzymes: No results  for input(s): CKTOTAL, CKMB, CKMBINDEX, TROPONINI in the last 168 hours. BNP (last 3 results) No results for input(s): PROBNP in the last 8760 hours. HbA1C: No results for input(s): HGBA1C in the last 72 hours. CBG: Recent Labs  Lab 01/20/24 2219 01/21/24 0004 01/21/24 0347 01/21/24 0800 01/21/24 1130  GLUCAP 148* 142* 106* 115* 150*   Lipid Profile: No results for input(s): CHOL, HDL, LDLCALC, TRIG, CHOLHDL, LDLDIRECT in the last 72 hours. Thyroid  Function Tests: No results for input(s): TSH, T4TOTAL, FREET4, T3FREE, THYROIDAB in the last 72 hours. Anemia Panel: No results for input(s): VITAMINB12, FOLATE, FERRITIN, TIBC, IRON ,  RETICCTPCT in the last 72 hours. Urine analysis:    Component Value Date/Time   COLORURINE YELLOW 01/20/2024 2103   APPEARANCEUR HAZY (A) 01/20/2024 2103   LABSPEC 1.007 01/20/2024 2103   PHURINE 5.0 01/20/2024 2103   GLUCOSEU >=500 (A) 01/20/2024 2103   HGBUR SMALL (A) 01/20/2024 2103   BILIRUBINUR NEGATIVE 01/20/2024 2103   BILIRUBINUR negative 10/22/2023 0916   BILIRUBINUR negative 07/11/2021 0812   KETONESUR NEGATIVE 01/20/2024 2103   PROTEINUR 100 (A) 01/20/2024 2103   UROBILINOGEN 0.2 10/22/2023 0916   NITRITE NEGATIVE 01/20/2024 2103   LEUKOCYTESUR TRACE (A) 01/20/2024 2103   Sepsis Labs: @LABRCNTIP (procalcitonin:4,lacticidven:4)  ) Recent Results (from the past 240 hours)  Culture, blood (Routine X 2) w Reflex to ID Panel     Status: None (Preliminary result)   Collection Time: 01/20/24  7:55 PM   Specimen: BLOOD  Result Value Ref Range Status   Specimen Description BLOOD RIGHT ANTECUBITAL  Final   Special Requests   Final    BOTTLES DRAWN AEROBIC AND ANAEROBIC Blood Culture results may not be optimal due to an inadequate volume of blood received in culture bottles   Culture   Final    NO GROWTH < 12 HOURS Performed at [facility], [address], [place], [place] [address]    Report Status PENDING  Incomplete  Culture, blood (Routine X 2) w Reflex to ID Panel     Status: None (Preliminary result)   Collection Time: 01/20/24 11:31 PM   Specimen: BLOOD LEFT ARM  Result Value Ref Range Status   Specimen Description BLOOD LEFT ARM  Final   Special Requests   Final    BOTTLES DRAWN AEROBIC AND ANAEROBIC Blood Culture adequate volume   Culture   Final    NO GROWTH < 12 HOURS Performed at [facility], [address], [place], [place] [address]    Report Status PENDING  Incomplete  MRSA Next Gen by PCR, Nasal     Status: None   Collection Time: 01/21/24  8:55 AM   Specimen: Nasal Mucosa; Nasal Swab  Result Value Ref Range Status   MRSA by PCR Next  Gen NOT DETECTED NOT DETECTED Final    Comment: (NOTE) The GeneXpert MRSA Assay (FDA approved for NASAL specimens only), is one component of a comprehensive MRSA colonization surveillance program. It is not intended to diagnose MRSA infection nor to guide or monitor treatment for MRSA infections. Test performance is not FDA approved in patients less than 15 years old. Performed at [facility], [address], [place], [place] [address]      Radiology Studies: MR BRAIN WO CONTRAST Result Date: 01/21/2024 CLINICAL DATA:  Altered mental status EXAM: MRI HEAD WITHOUT CONTRAST TECHNIQUE: Multiplanar, multiecho pulse sequences of the brain and surrounding structures were obtained without intravenous contrast. COMPARISON:  None Available. FINDINGS: Brain:  There is no significant signal abnormality in the brain parenchyma. There is no acute or chronic No hydrocephalus. No hemorrhage. Vascular: There are normal flow signals in the carotid and basilar arteries Skull and upper cervical spine: No significant abnormality Sinuses/Orbits: Small amount of fluid in the sinuses Other: None IMPRESSION: No significant abnormality Electronically Signed   By: Nancyann Burns M.D.   On: 01/21/2024 11:18   DG CHEST PORT 1 VIEW Result Date: 01/21/2024 CLINICAL DATA:  Acute on chronic combined cystic and diastolic congestive heart failure EXAM: PORTABLE CHEST 1 VIEW COMPARISON:  01/19/2024 FINDINGS: Normal cardiac silhouette. There is patchy bilateral lower lobe airspace disease. Small bilateral effusions. Upper lungs clear. No acute osseous abnormality. IMPRESSION: Bibasilar effusions and lower lobe airspace disease suggest pulmonary edema with effusions. Central congestive heart failure Electronically Signed   By: Jackquline Boxer M.D.   On: 01/21/2024 09:51     Scheduled Meds:  allopurinol   50 mg Oral Daily   aspirin  EC  81 mg Oral Daily   busPIRone   15 mg Oral TID   carvedilol   6.25 mg Oral BID WC    Chlorhexidine  Gluconate Cloth  6 each Topical Daily   ezetimibe   10 mg Oral Daily   furosemide   40 mg Intravenous BID   [START ON 01/22/2024] gabapentin   300 mg Oral QHS   heparin   5,000 Units Subcutaneous Q8H   hydrALAZINE   10 mg Intravenous Q6H   insulin  aspart  0-9 Units Subcutaneous Q4H   pantoprazole   40 mg Oral Daily   sodium chloride  flush  3 mL Intravenous Q12H   ticagrelor   90 mg Oral BID   Continuous Infusions:   LOS: 1 day    Time spent:    Sigurd Pac, MD [facility]   01/21/2024, 11:50 AM

## 2024-01-22 ENCOUNTER — Other Ambulatory Visit (HOSPITAL_COMMUNITY)

## 2024-01-22 DIAGNOSIS — I5023 Acute on chronic systolic (congestive) heart failure: Secondary | ICD-10-CM | POA: Diagnosis not present

## 2024-01-22 DIAGNOSIS — E1022 Type 1 diabetes mellitus with diabetic chronic kidney disease: Secondary | ICD-10-CM | POA: Diagnosis not present

## 2024-01-22 DIAGNOSIS — I25119 Atherosclerotic heart disease of native coronary artery with unspecified angina pectoris: Secondary | ICD-10-CM | POA: Diagnosis not present

## 2024-01-22 DIAGNOSIS — F418 Other specified anxiety disorders: Secondary | ICD-10-CM

## 2024-01-22 DIAGNOSIS — E66811 Obesity, class 1: Secondary | ICD-10-CM

## 2024-01-22 DIAGNOSIS — E1065 Type 1 diabetes mellitus with hyperglycemia: Secondary | ICD-10-CM

## 2024-01-22 DIAGNOSIS — G9341 Metabolic encephalopathy: Secondary | ICD-10-CM | POA: Diagnosis not present

## 2024-01-22 DIAGNOSIS — N184 Chronic kidney disease, stage 4 (severe): Secondary | ICD-10-CM

## 2024-01-22 LAB — COMPREHENSIVE METABOLIC PANEL WITH GFR
ALT: 9 U/L (ref 0–44)
AST: 14 U/L — ABNORMAL LOW (ref 15–41)
Albumin: 2.3 g/dL — ABNORMAL LOW (ref 3.5–5.0)
Alkaline Phosphatase: 97 U/L (ref 38–126)
Anion gap: 14 (ref 5–15)
BUN: 39 mg/dL — ABNORMAL HIGH (ref 6–20)
CO2: 20 mmol/L — ABNORMAL LOW (ref 22–32)
Calcium: 8.7 mg/dL — ABNORMAL LOW (ref 8.9–10.3)
Chloride: 104 mmol/L (ref 98–111)
Creatinine, Ser: 2.25 mg/dL — ABNORMAL HIGH (ref 0.44–1.00)
GFR, Estimated: 24 mL/min — ABNORMAL LOW (ref >60)
Glucose, Bld: 138 mg/dL — ABNORMAL HIGH (ref 70–99)
Potassium: 3.6 mmol/L (ref 3.5–5.1)
Sodium: 138 mmol/L (ref 135–145)
Total Bilirubin: 1.5 mg/dL — ABNORMAL HIGH (ref 0.0–1.2)
Total Protein: 7.5 g/dL (ref 6.5–8.1)

## 2024-01-22 LAB — GLUCOSE, CAPILLARY
Glucose-Capillary: 111 mg/dL — ABNORMAL HIGH (ref 70–99)
Glucose-Capillary: 136 mg/dL — ABNORMAL HIGH (ref 70–99)
Glucose-Capillary: 141 mg/dL — ABNORMAL HIGH (ref 70–99)
Glucose-Capillary: 150 mg/dL — ABNORMAL HIGH (ref 70–99)
Glucose-Capillary: 157 mg/dL — ABNORMAL HIGH (ref 70–99)
Glucose-Capillary: 158 mg/dL — ABNORMAL HIGH (ref 70–99)

## 2024-01-22 LAB — CBC
HCT: 30.9 % — ABNORMAL LOW (ref 36.0–46.0)
Hemoglobin: 9.6 g/dL — ABNORMAL LOW (ref 12.0–15.0)
MCH: 26.7 pg (ref 26.0–34.0)
MCHC: 31.1 g/dL (ref 30.0–36.0)
MCV: 86.1 fL (ref 80.0–100.0)
Platelets: 290 K/uL (ref 150–400)
RBC: 3.59 MIL/uL — ABNORMAL LOW (ref 3.87–5.11)
RDW: 17.9 % — ABNORMAL HIGH (ref 11.5–15.5)
WBC: 9 K/uL (ref 4.0–10.5)
nRBC: 0 % (ref 0.0–0.2)

## 2024-01-22 MED ORDER — JUVEN PO PACK
1.0000 | PACK | Freq: Two times a day (BID) | ORAL | Status: DC
  Filled 2024-01-22 (×2): qty 1

## 2024-01-22 MED ORDER — NEPRO/CARBSTEADY PO LIQD: 237.0000 mL | Freq: Two times a day (BID) | ORAL | Status: DC

## 2024-01-22 NOTE — Progress Notes (Addendum)
 Initial Nutrition Assessment  DOCUMENTATION CODES:   Obesity unspecified  INTERVENTION:  -Continue diet as ordered -Add Nepro BID for concentrated kcal/pro -Add Juven BID for wound healing -Continue to provide education to pt/family   NUTRITION DIAGNOSIS:   Inadequate oral intake related to poor appetite as evidenced by meal completion < 25%.  GOAL:   Patient will meet greater than or equal to 90% of their needs   MONITOR:   PO intake, Supplement acceptance, Skin, Labs, Weight trends  REASON FOR ASSESSMENT:   LOS (Poor PO intake)    ASSESSMENT:   Pt hx CAD s/p PCI, HFmrEF, CKD stage IV,DM1, anemia of chronic disease, depression/anxiety, chronic pain syndrome, OSA on CPAP, postoperative left ankle wound infection with MRSA bacteremia now s/p left BKA. Originially presented to [facility] for [facility]ness of breath, dx of HCAP, sepsis, CHF. Receives abx, diuretics on/off r/t kidney function. On 7/6 pt became lethargic and stopped speaking to staff/family, only crying from 7/6-7/8.  Spoke to family in room, pt with AMS and is not responding at this time. Family states pt was eating very well up until 3 days ago when the AMS began. Pt now not taking anything by mouth per family, pushes them away when anyone brings food to her mouth. Pt has shown improvement to mental status as she is no longer crying, did say a couple words to her mother today. Family with questions about PO intake, tube feeding, TPN, etc. Discussed that if in a couple days, per IDT discussion, we did decide she needed nutrition support it would be tube feeding, what that would looks like, etc., but we are not at that point yet. Pt does have hx of SPCM, was unable to do NFPE today and will need one at f/u. Family states weight was stable prior to this hospitalization. Pt was hospitalized previously for sepsis, infection of BKA stump, SPCM then sent to CIR and ultimately d/c home on 6/26. At home she was ambulatory,  could make own food, take herself to the commode, get in/out of her hospital bed.  Labs BG 88-150 BUN 39 Creatinine 2.25 Calcium  8.7 Albumin  2.3 AST 14 GFR 24 H/H 9.6/30.9  Medications Buspar  Furosemide  Heparin  Hydralazine  SSI Protonix   NUTRITION - FOCUSED PHYSICAL EXAM:  Unable to complete at today's visit, will obtain at f/u  Diet Order:   Diet Order             Diet Carb Modified Fluid consistency: Thin  Diet effective now                   EDUCATION NEEDS:   Not appropriate for education at this time  Skin:  Skin Assessment: Skin Integrity Issues: Skin Integrity Issues:: Incisions Incisions: Left BKA stump wound  Last BM:  Unsure of last BM  Height:   Ht Readings from Last 1 Encounters:  01/20/24 5' 5 (1.651 m)    Weight:   Wt Readings from Last 1 Encounters:  01/22/24 89 kg    BMI:  Body mass index is 32.65 kg/m.  Estimated Nutritional Needs:   Kcal:  1900-2350 kcal  Protein:  85-115 g  Fluid:  >/=2L    Lynisha Osuch Daml-Budig, RDN, LDN Registered Dietitian Nutritionist RD Inpatient Contact Info in [place]

## 2024-01-22 NOTE — Assessment & Plan Note (Signed)
 Volume status has improved Continue carvedilol   and transition to oral diuretic therapy in am.

## 2024-01-22 NOTE — Assessment & Plan Note (Signed)
 Continue insulin  therapy, basal and short acting.  Fasting glucose this am 138 mg/dl

## 2024-01-22 NOTE — Assessment & Plan Note (Signed)
 Renal function with serum cr at 2.25 with K at 3,6 and serum bicarbonate at 20  Na 138   Foley catheter placed 07/06 due to urinary retention.

## 2024-01-22 NOTE — TOC Initial Note (Signed)
 Transition of Care [facility]) - Initial/Assessment Note    Patient Details  Name: Dana [name] MRN: [id number] Date of Birth: [birth year]  Transition of [facility] CM/SW Contact:    Waddell Barnie Rama, RN Phone Number: 01/22/2024, 4:04 PM  Clinical Narrative:                  Per spouse, patient shook head yes can speak with spouse, From home with spouse, has PCP and insurance on file, spouse, states has  [facility] services in place with [facility], HHPT and would like to continue if appropriate. She has a hospital bed, bsc and w/chair  at home.   family member will transport them home at [employer] and family is support system, states gets medications from [facility] (delivery)   Pta self left BKA,   Expected Discharge Plan: Home w Home Health Services Barriers to Discharge: Continued Medical Work up   Patient Goals and CMS Choice Patient states their goals for this hospitalization and ongoing recovery are:: nonverbal   Choice offered to / list presented to : NA      Expected Discharge Plan and Services In-house Referral: NA Discharge Planning Services: CM Consult Post Acute Care Choice: Resumption of Svcs/PTA Provider, Home Health Living arrangements for the past 2 months: Single Family Home                 DME Arranged: N/A DME Agency: NA       HH Arranged: RN, PT HH Agency: [facility] Date HH Agency Contacted: 01/22/24 Time HH Agency Contacted: 1603 Representative spoke with at [facility] Agency: [place]  Prior Living Arrangements/Services Living arrangements for the past 2 months: Single Family Home Lives with:: Spouse, Parents Patient language and need for interpreter reviewed:: Yes        Need for Family Participation in Patient Care: Yes (Comment) Care giver support system in place?: Yes (comment) Current home services: DME (hospital bed, bsc, w/chair) Criminal Activity/Legal Involvement Pertinent to Current Situation/Hospitalization: No - Comment as  needed  Activities of Daily Living      Permission Sought/Granted Permission sought to share information with : Case Manager Permission granted to share information with : Yes, Verbal Permission Granted  Share Information with NAME: spouse  Permission granted to share info w AGENCY: HH        Emotional Assessment Appearance:: Appears stated age Attitude/Demeanor/Rapport: Unresponsive Affect (typically observed): Unable to Assess   Alcohol / Substance Use: Not Applicable Psych Involvement: No (comment)  Admission diagnosis:  AKI (acute kidney injury) (HCC) [N17.9] Sepsis (HCC) [A41.9] Acute on chronic combined systolic and diastolic congestive heart failure (HCC) [I50.43] Patient Active Problem List   Diagnosis Date Noted   Acute on chronic combined systolic and diastolic congestive heart failure (HCC) 01/20/2024   Altered mental status 01/20/2024   ABLA (acute blood loss anemia) 01/02/2024   Moderate episode of recurrent major depressive disorder (HCC) 01/01/2024   Left below-knee amputee (HCC) 12/27/2023   S/P BKA (below knee amputation), left (HCC) 12/20/2023   High pulmonary arterial pressure (HCC) 12/17/2023   Severe protein-calorie malnutrition (HCC) 12/15/2023   Severe sepsis secondary to postoperative left ankle infection and MRSA bacteremia 12/14/2023   MRSA bacteremia - end of therapy with Daptomycin  12/28/2023. 12/14/2023   Depression with anxiety 12/14/2023   Acute pyelonephritis 12/14/2023   Trimalleolar fracture of ankle, closed 11/11/2023   Situational insomnia 10/04/2023   Acute on chronic combined systolic and diastolic CHF (congestive heart failure) (HCC)  09/16/2023   SOB (shortness of breath) 08/27/2023   Hospital discharge follow-up 07/29/2023   Coronary artery disease involving native coronary artery of native heart with angina pectoris (HCC) 07/20/2023   Presence of heart assist device (HCC) 07/13/2023   Moderate recurrent major depression (HCC)  07/13/2023   History of amputation of hallux (HCC) 05/24/2023   Bilateral hand pain 05/14/2023   Contrast-induced nephropathy 02/20/2023   Unstable angina (HCC) 09/07/2022   Chronic idiopathic constipation 09/01/2022   Hyperphosphatemia 01/09/2022   Genetic testing 11/07/2021   Family history of breast cancer 10/23/2021   Generalized anxiety disorder 07/11/2021   Mixed hyperlipidemia 07/11/2021   S/P mitral valve clip implantation 05/18/2021   History of cardiac arrest 02/06/2021   Impaired mobility and ADLs 01/20/2021   Left-sided weakness 01/20/2021   Drug-induced myopathy 01/03/2021   Secondary hyperparathyroidism of renal origin (HCC) 11/18/2020   Vitamin D  insufficiency 11/18/2020   Uncontrolled type 1 diabetes mellitus with hyperglycemia, with long-term current use of insulin  (HCC) 10/13/2020   CKD stage 4 due to type 1 diabetes mellitus (HCC) - baseline 2.2 - 2.4 10/13/2020   NSTEMI (non-ST elevated myocardial infarction) (HCC) 09/03/2020   Contrast dye induced nephropathy, possible 09/03/2020   OSA on CPAP 09/03/2020   Insulin  dependent type 2 diabetes mellitus (HCC) 09/03/2020   Need for COVID-19 vaccine 06/28/2020   Chronic gout without tophus 03/23/2020   Pain in both hands 03/23/2020   Uncomplicated opioid dependence (HCC) 03/23/2020   Hypomagnesemia 12/21/2019   Lumbar back pain 10/05/2019   Hypertensive heart and renal disease with congestive heart failure (HCC) 09/18/2019   Chronic combined systolic (congestive) and diastolic (congestive) heart failure (HCC) 11/07/2017   Class 2 severe obesity due to excess calories with serious comorbidity and body mass index (BMI) of 39.0 to 39.9 in adult (HCC) 11/07/2017   Hyperlipidemia LDL goal <70 12/14/2016   Anemia in CKD (chronic kidney disease) 03/23/2016   Chronic pain syndrome 03/23/2016   GERD without esophagitis 03/23/2016   On home oxygen  therapy 03/23/2016   S/P ablation of atrial fibrillation 10/04/2015    Diabetic polyneuropathy associated with type 2 diabetes mellitus (HCC) 04/02/2014   PCP:  Sherre Clapper, MD Pharmacy:   [facility] [address] [place] [address] Phone: [phone] Fax: [phone]  [facility] [address] [place] [address] Phone: [phone] Fax: [phone]  [facility] [address] [place] [address] Phone: [phone] Fax: [phone]     Social Drivers of Health (SDOH) Social History: SDOH Screenings   Food Insecurity: No Food Insecurity (01/21/2024)  Housing: Low Risk  (01/21/2024)  Transportation Needs: No Transportation Needs (01/21/2024)  Utilities: Not At Risk (01/21/2024)  Alcohol Screen: Low Risk  (10/24/2023)  Depression (PHQ2-9): High Risk (10/24/2023)  Financial Resource Strain: Low Risk  (10/24/2023)  Physical Activity: Insufficiently Active (10/24/2023)  Social Connections: Moderately Integrated (12/14/2023)  Stress: Stress Concern Present (10/24/2023)  Tobacco Use: Medium Risk (01/16/2024)  Health Literacy: Adequate Health Literacy (10/24/2023)   SDOH Interventions:     Readmission Risk Interventions    01/22/2024    4:01 PM 07/19/2023    3:45 PM 12/16/2022   11:54 AM  Readmission Risk Prevention Plan  Transportation Screening Complete Complete Complete  Medication Review (RN Care Manager) Complete Referral to Pharmacy Referral to Pharmacy  PCP or Specialist appointment within 3-5 days of discharge   Complete  HRI or Home  Care Consult Complete Complete Complete  SW Recovery Care/Counseling Consult  Complete Complete  Palliative Care Screening  Not Applicable Not Applicable  Skilled Nursing Facility Not Applicable Not Applicable Not Applicable

## 2024-01-22 NOTE — Assessment & Plan Note (Signed)
Continue with buspirone. ? ?

## 2024-01-22 NOTE — Progress Notes (Signed)
 Pt is now eating choc pudding, ice chips, and enthusiastically seeking more. Her daughter is feeding her some things, and we are going to see if she will drink spoon fulls of ensure protein drink. (Previously pt was non-participatory in attempts to medicate/feed)   Additionally, patient seems more wakeful, and less somnolent, engaged.  Velinda Server, RN 01/22/2024 4:57 PM

## 2024-01-22 NOTE — Progress Notes (Signed)
 Progress Note   Patient: Dana [name] FMW:[id number] DOB: [birth year] DOA: 01/20/2024     2 DOS: the patient was seen and examined on 01/22/2024   Brief hospital course: Dana [name] was admitted to the hospital with the working diagnosis of acute metabolic encephalopathy   63 yo female chronically ill with type 1 diabetes, CAD s/p PCI, HFmrEF, CKD stage IV, type 1 diabetes, anemia of chronic disease, depression/anxiety, chronic pain syndrome, OSA on CPAP, postoperative left ankle wound infection with MRSA bacteremia now s/p left BKA who initially presented to [facility] for shortness of breath.   Recent prolonged hospitalization 5/31-6/13 for severe sepsis, MRSA bacteremia left ankle infection underwent left BKA, TEE was negative for endocarditis completed 2 weeks of IV daptomycin , seen by psych for significant anxiety and depression related to amputation, discharged to [facility] on 6/13 and then went home 6/26  Presented to [facility] on 7/3 with worsening dyspnea and cough, suspected to be from CHF and or pneumonia, treated with cefepime linezolid doxycycline and diuretics, 2 to 3 days ago was also noted to be more tired sleepy, less interactive and then 36 hours ago started crying incessantly, not community stating her needs are indicating pain or other symptoms, not eating or taking meds. -Had a Foley catheter placed few days ago for retention - Transferred to [facility] yesterday evening and admitted by Dr. Tobie   Studies at [facility]: CTA chest 01/17/2024: IMPRESSION: 1. Cardiac enlargement. Mitra-clip in place. Pulmonary arterial hypertension. CardioMEMS device in place. 2. No evidence of acute or chronic pulmonary embolus. 3. No aortic dissection or aneurysm. 4. Interstitial edema and early alveolar edema. 5. Shotty mediastinal lymph nodes are likely reactive    Renal ultrasound 01/19/2024: IMPRESSION: Mild medical renal disease. Foley in the urinary bladder.   CT head without contrast  01/20/2024: IMPRESSION: 1. No intracranial hemorrhage or ischemia at this time. Small focus of encephalomalacia on the right at the external capsule. Stable. 2. Acute on chronic sinusitis. This has worsened.  Assessment and Plan: * Acute metabolic encephalopathy Patient non focal.  MRI With no acute changes.  Continue to have global encephalopathy and not back to baseline.  Check EEG   If now improvement in the next 24 hrs with supportive medical therapy will consult psychiatry, possible catatonia.   Acute on chronic systolic CHF (congestive heart failure) (HCC) Volume status has improved Continue carvedilol   and transition to oral diuretic therapy in am.   CKD stage 4 due to type 1 diabetes mellitus (HCC) - baseline 2.2 - 2.4 Renal function with serum cr at 2.25 with K at 3,6 and serum bicarbonate at 20  Na 138   Foley catheter placed 07/06 due to urinary retention.   Coronary artery disease involving native coronary artery of native heart with angina pectoris (HCC) No chest pain.  Angioplasty on 07/2023  Continue aspirin , ticagrelor  and ezetimibe   Not on statin due to myalgias.   Uncontrolled type 1 diabetes mellitus with hyperglycemia, with long-term current use of insulin  (HCC) Continue insulin  therapy, basal and short acting.  Fasting glucose this am 138 mg/dl   Depression with anxiety Continue with buspirone    Obesity, class 1 Calculated BMI is 32.6   OSA at home on Cpap/         Subjective: patient continue with poor communication, she said I love you to her mother at the bedside, not responding questions when asked directly.   Physical Exam: Vitals:   01/22/24 0010 01/22/24 0445 01/22/24 9272 01/22/24  0814  BP: 129/64 (!) 143/52 (!) 145/119 (!) 143/49  Pulse: 93 99 97 94  Resp: 20 20 (!) 23 20  Temp: 98.5 F (36.9 C) 97.7 F (36.5 C) (!) 97 F (36.1 C)   TempSrc: Oral Oral Oral   SpO2: 100% 95% 96% 96%  Weight:  89 kg    Height:       Neurology  eyes open, she follows with her head but does not respond to questions or follows commands ENT with mild pallor, oral mucosa dry  Cardiovascular with S1 and S2 present and regular with no gallops or rubs Respiratory with no rales or wheezing, no rhonchi  Abdomen with no distention  No lower extremity edema  Data Reviewed:    Family Communication: I spoke with patient's mother at the bedside, we talked in detail about patient's condition, plan of care and prognosis and all questions were addressed.   Disposition: Status is: Inpatient Remains inpatient appropriate because: recovering encephalopathy   Planned Discharge Destination: Home   Author: Elidia Toribio Furnace, MD 01/22/2024 11:36 AM  For on call review [email or web address].

## 2024-01-22 NOTE — Assessment & Plan Note (Signed)
 Patient non focal.  MRI With no acute changes.  Continue to have global encephalopathy and not back to baseline.  Check EEG   If now improvement in the next 24 hrs with supportive medical therapy will consult psychiatry, possible catatonia.

## 2024-01-22 NOTE — Evaluation (Signed)
 Speech Language Pathology Evaluation Patient Details Name: Dana [name] MRN: [id number] DOB: Apr 23, 1961 Today's Date: 01/22/2024 Time: 8574-8561 SLP Time Calculation (min) (ACUTE ONLY): 13 min  Problem List:  Patient Active Problem List   Diagnosis Date Noted   Acute on chronic combined systolic and diastolic congestive heart failure (HCC) 01/20/2024   Altered mental status 01/20/2024   ABLA (acute blood loss anemia) 01/02/2024   Moderate episode of recurrent major depressive disorder (HCC) 01/01/2024   Left below-knee amputee (HCC) 12/27/2023   S/P BKA (below knee amputation), left (HCC) 12/20/2023   High pulmonary arterial pressure (HCC) 12/17/2023   Severe protein-calorie malnutrition (HCC) 12/15/2023   Severe sepsis secondary to postoperative left ankle infection and MRSA bacteremia 12/14/2023   MRSA bacteremia - end of therapy with Daptomycin  12/28/2023. 12/14/2023   Depression with anxiety 12/14/2023   Acute pyelonephritis 12/14/2023   Trimalleolar fracture of ankle, closed 11/11/2023   Situational insomnia 10/04/2023   Acute on chronic combined systolic and diastolic CHF (congestive heart failure) (HCC) 09/16/2023   SOB (shortness of breath) 08/27/2023   Hospital discharge follow-up 07/29/2023   Coronary artery disease involving native coronary artery of native heart with angina pectoris (HCC) 07/20/2023   Presence of heart assist device (HCC) 07/13/2023   Moderate recurrent major depression (HCC) 07/13/2023   History of amputation of hallux (HCC) 05/24/2023   Bilateral hand pain 05/14/2023   Contrast-induced nephropathy 02/20/2023   Unstable angina (HCC) 09/07/2022   Chronic idiopathic constipation 09/01/2022   Hyperphosphatemia 01/09/2022   Genetic testing 11/07/2021   Family history of breast cancer 10/23/2021   Generalized anxiety disorder 07/11/2021   Mixed hyperlipidemia 07/11/2021   S/P mitral valve clip implantation 05/18/2021   History of cardiac arrest  02/06/2021   Impaired mobility and ADLs 01/20/2021   Left-sided weakness 01/20/2021   Drug-induced myopathy 01/03/2021   Secondary hyperparathyroidism of renal origin (HCC) 11/18/2020   Vitamin D  insufficiency 11/18/2020   Uncontrolled type 1 diabetes mellitus with hyperglycemia, with long-term current use of insulin  (HCC) 10/13/2020   CKD stage 4 due to type 1 diabetes mellitus (HCC) - baseline 2.2 - 2.4 10/13/2020   NSTEMI (non-ST elevated myocardial infarction) (HCC) 09/03/2020   Contrast dye induced nephropathy, possible 09/03/2020   OSA on CPAP 09/03/2020   Insulin  dependent type 2 diabetes mellitus (HCC) 09/03/2020   Need for COVID-19 vaccine 06/28/2020   Chronic gout without tophus 03/23/2020   Pain in both hands 03/23/2020   Uncomplicated opioid dependence (HCC) 03/23/2020   Hypomagnesemia 12/21/2019   Lumbar back pain 10/05/2019   Hypertensive heart and renal disease with congestive heart failure (HCC) 09/18/2019   Chronic combined systolic (congestive) and diastolic (congestive) heart failure (HCC) 11/07/2017   Class 2 severe obesity due to excess calories with serious comorbidity and body mass index (BMI) of 39.0 to 39.9 in adult (HCC) 11/07/2017   Hyperlipidemia LDL goal <70 12/14/2016   Anemia in CKD (chronic kidney disease) 03/23/2016   Chronic pain syndrome 03/23/2016   GERD without esophagitis 03/23/2016   On home oxygen  therapy 03/23/2016   S/P ablation of atrial fibrillation 10/04/2015   Diabetic polyneuropathy associated with type 2 diabetes mellitus (HCC) 04/02/2014   Past Medical History:  Past Medical History:  Diagnosis Date   Acquired absence of left great toe (HCC)    Acquired absence of other left toe(s) (HCC)    ACS (acute coronary syndrome) (HCC)    Acute bronchitis due to COVID-19 virus 03/03/2023   Acute on chronic combined systolic  and diastolic CHF (congestive heart failure) (HCC) 09/16/2023   Acute respiratory failure with hypoxia (HCC) 07/20/2023    Anemia    Anoxic brain injury (HCC)    Anxiety    CAD (coronary artery disease)    DES mLAD 04/07/15; DES dRCA & DES pPDA 08/30/16; DES mCX & PTCA OM1 09/29/20   Charcot's joint of foot in type 2 diabetes mellitus (HCC) 12/15/2023   Chronic diastolic (congestive) heart failure (HCC)    Chronic pain    Chronic systolic (congestive) heart failure (HCC)    CKD (chronic kidney disease)    Contrast dye induced nephropathy, possible 09/03/2020   COPD (chronic obstructive pulmonary disease) (HCC)    COVID-19 virus infection 03/03/2023   Demand ischemia (HCC)    Diabetes (HCC)type 1    Diastolic CHF (HCC)    Dyspnea    Encounter for screening mammogram for malignant neoplasm of breast 09/16/2023   Family history of breast cancer 10/23/2021   Gastro-esophageal reflux disease without esophagitis    Hyperlipidemia    Hypertension    Hypertensive heart disease with heart failure (HCC)    Hypoxemia    Idiopathic gout, multiple sites    Leukocytosis 03/29/2022   Localized edema    Low back pain    Mitral regurgitation    Mitral regurgitation    Mixed hyperlipidemia    Mixed incontinence    Morbid (severe) obesity due to excess calories (HCC)    Neuromuscular disorder (HCC)    Neuropathy    Non-ST elevation (NSTEMI) myocardial infarction (HCC)    08/29/20, 09/29/20   OSA (obstructive sleep apnea) 09/03/2020   Other specified hypothyroidism    PAF (paroxysmal atrial fibrillation) (HCC)    s/p pulmonary vein isolation by cryoablation 10/04/15 Dr. Meldon in [facility]   PEA (Pulseless electrical activity) [facility])    ~ 01/13/21 at [facility] during admission DKA, volume overload, possible CAP, hypoxia s/p ACLS with ROSC ~ 10-15   Pneumonia    Primary insomnia    Rheumatoid arthritis (HCC)    Stroke (HCC)    Thyroid  disease    Type 1 diabetes mellitus (HCC)    URI with cough and congestion 08/22/2023   Vitamin D  deficiency    Past Surgical History:  Past Surgical History:  Procedure  Laterality Date   ABDOMINAL HYSTERECTOMY     Partial- Still has both ovaries   AMPUTATION Left 12/18/2023   Procedure: AMPUTATION BELOW KNEE;  Surgeon: Harden Jerona GAILS, MD;  Location: [facility] OR;  Service: Orthopedics;  Laterality: Left;   ANKLE FUSION Left 11/13/2023   Procedure: HINDFOOT FUSION FIXATION OF ANKLE;  Surgeon: Kendal Franky SQUIBB, MD;  Location: [facility] OR;  Service: Orthopedics;  Laterality: Left;   CARDIOVASCULAR STRESS TEST  08/13/2014   Nuclear; Normal   CARPAL TUNNEL RELEASE     CATARACT EXTRACTION, BILATERAL     CESAREAN SECTION     CHOLECYSTECTOMY     CORONARY ANGIOPLASTY WITH STENT PLACEMENT     3 blockages/ 1 stent   CORONARY BALLOON ANGIOPLASTY N/A 07/18/2023   Procedure: CORONARY BALLOON ANGIOPLASTY;  Surgeon: Verlin Lonni BIRCH, MD;  Location: [facility] INVASIVE CV LAB;  Service: Cardiovascular;  Laterality: N/A;   CORONARY PRESSURE/FFR STUDY N/A 09/07/2022   Procedure: INTRAVASCULAR PRESSURE WIRE/FFR STUDY;  Surgeon: Swaziland, Peter M, MD;  Location: [facility] INVASIVE CV LAB;  Service: Cardiovascular;  Laterality: N/A;   CORONARY STENT INTERVENTION N/A 09/29/2020   Procedure: CORONARY STENT INTERVENTION;  Surgeon: Wonda Sharper, MD;  Location:  [facility] INVASIVE CV LAB;  Service: Cardiovascular;  Laterality: N/A;  CFX   CORONARY STENT INTERVENTION N/A 09/07/2022   Procedure: CORONARY STENT INTERVENTION;  Surgeon: Swaziland, Peter M, MD;  Location: [facility] INVASIVE CV LAB;  Service: Cardiovascular;  Laterality: N/A;   CORONARY STENT INTERVENTION N/A 02/13/2023   Procedure: CORONARY STENT INTERVENTION;  Surgeon: Verlin Lonni BIRCH, MD;  Location: [facility] INVASIVE CV LAB;  Service: Cardiovascular;  Laterality: N/A;  LAD   CORONARY ULTRASOUND/IVUS N/A 02/13/2023   Procedure: Coronary Ultrasound/IVUS;  Surgeon: Verlin Lonni BIRCH, MD;  Location: [facility] INVASIVE CV LAB;  Service: Cardiovascular;  Laterality: N/A;   CORONARY/GRAFT ACUTE MI REVASCULARIZATION N/A 12/08/2022   Procedure: Coronary/Graft Acute  MI Revascularization;  Surgeon: Anner Alm ORN, MD;  Location: [facility] INVASIVE CV LAB;  Service: Cardiovascular;  Laterality: N/A;   EYE SURGERY     LEFT HEART CATH AND CORONARY ANGIOGRAPHY N/A 09/29/2020   Procedure: LEFT HEART CATH AND CORONARY ANGIOGRAPHY;  Surgeon: Wonda Sharper, MD;  Location: [facility] INVASIVE CV LAB;  Service: Cardiovascular;  Laterality: N/A;   LEFT HEART CATH AND CORONARY ANGIOGRAPHY N/A 09/07/2022   Procedure: LEFT HEART CATH AND CORONARY ANGIOGRAPHY;  Surgeon: Swaziland, Peter M, MD;  Location: [facility] INVASIVE CV LAB;  Service: Cardiovascular;  Laterality: N/A;   LEFT HEART CATH AND CORONARY ANGIOGRAPHY N/A 12/08/2022   Procedure: LEFT HEART CATH AND CORONARY ANGIOGRAPHY;  Surgeon: Anner Alm ORN, MD;  Location: [facility] INVASIVE CV LAB;  Service: Cardiovascular;  Laterality: N/A;   LEFT HEART CATH AND CORONARY ANGIOGRAPHY N/A 02/13/2023   Procedure: LEFT HEART CATH AND CORONARY ANGIOGRAPHY;  Surgeon: Verlin Lonni BIRCH, MD;  Location: [facility] INVASIVE CV LAB;  Service: Cardiovascular;  Laterality: N/A;   LEFT HEART CATH AND CORONARY ANGIOGRAPHY N/A 07/16/2023   Procedure: LEFT HEART CATH AND CORONARY ANGIOGRAPHY;  Surgeon: Verlin Lonni BIRCH, MD;  Location: [facility] INVASIVE CV LAB;  Service: Cardiovascular;  Laterality: N/A;   RIGHT HEART CATH N/A 04/27/2021   Procedure: RIGHT HEART CATH;  Surgeon: Cherrie Toribio SAUNDERS, MD;  Location: [facility] INVASIVE CV LAB;  Service: Cardiovascular;  Laterality: N/A;   RIGHT/LEFT HEART CATH AND CORONARY ANGIOGRAPHY N/A 01/07/2017   Procedure: Right/Left Heart Cath and Coronary Angiography;  Surgeon: Rolan Ezra RAMAN, MD;  Location: [facility] INVASIVE CV LAB;  Service: Cardiovascular;  Laterality: N/A;   ROTATOR CUFF REPAIR     TEE WITHOUT CARDIOVERSION N/A 04/28/2021   Procedure: TRANSESOPHAGEAL ECHOCARDIOGRAM (TEE);  Surgeon: Cherrie Toribio SAUNDERS, MD;  Location: [facility] ENDOSCOPY;  Service: Cardiovascular;  Laterality: N/A;   TEE WITHOUT CARDIOVERSION N/A 05/18/2021    Procedure: TRANSESOPHAGEAL ECHOCARDIOGRAM (TEE);  Surgeon: Thukkani, Arun K, MD;  Location: [facility] INVASIVE CV LAB;  Service: Cardiovascular;  Laterality: N/A;   TOE AMPUTATION Left    TRANSCATHETER MITRAL EDGE TO EDGE REPAIR N/A 05/18/2021   Procedure: MITRAL VALVE REPAIR;  Surgeon: Wendel Lurena POUR, MD;  Location: [facility] INVASIVE CV LAB;  Service: Cardiovascular;  Laterality: N/A;   TRANSESOPHAGEAL ECHOCARDIOGRAM (CATH LAB) N/A 12/23/2023   Procedure: TRANSESOPHAGEAL ECHOCARDIOGRAM;  Surgeon: Kate Lonni CROME, MD;  Location: [facility] INVASIVE CV LAB;  Service: Cardiovascular;  Laterality: N/A;   US  ECHOCARDIOGRAPHY  05/2017   Normal   HPI:  82F chronically ill with type 1 diabetes, CAD s/p PCI, HFmrEF, CKD stage IV, type 1 diabetes, anemia of chronic disease, depression/anxiety, chronic pain syndrome, OSA on CPAP, postoperative left ankle wound infection with MRSA bacteremia now s/p left BKA who initially presented to [facility] for shortness of breath. Significant  change in mental status on 7/6 characterized by incessant crying, limited-no attempts at communication, and not following commands. MRI report states no significant abnormalities.   Assessment / Plan / Recommendation Clinical Impression  SLP utilized informal measures of assessment to evaluate patients cognitive linguistic skills. Upon entrance, patient lethargic and patients family reported limited verbalizations this date. Patient verbalized I love you to mother this AM. SLP attempted to speak with patient encouraging her to identify loved ones in the room, though unsuccessful. Patient with brief eye opening upon verbalization of her name, though immediately closed eyes again. Patient with no attempts at communication during SLP presence. Patient did not follow any single step commands. SLP provided family with basic communication board to aid patient in communicating wants/needs as alertness and participation improve.     SLP  Assessment  SLP Recommendation/Assessment: Patient needs continued Speech Language Pathology Services SLP Visit Diagnosis: Cognitive communication deficit (R41.841)     Assistance Recommended at Discharge   (TBD)  Functional Status Assessment Patient has had a recent decline in their functional status and demonstrates the ability to make significant improvements in function in a reasonable and predictable amount of time.  Frequency and Duration min 3x week  2 weeks      SLP Evaluation Cognition  Overall Cognitive Status: Difficult to assess (no verbalizations) Arousal/Alertness: Lethargic Attention: Focused Focused Attention: Impaired Focused Attention Impairment: Verbal basic;Functional basic       Comprehension  Auditory Comprehension Overall Auditory Comprehension: Impaired Yes/No Questions: Impaired Commands: Impaired Other Conversation Comments: would not respond to questions verbally nor using gestures. did not attempt to follow commands    Expression Expression Primary Mode of Expression: Verbal Verbal Expression Overall Verbal Expression: Impaired Level of Generative/Spontaneous Verbalization:  (none) Repetition: Impaired Naming: Impairment   Oral / Motor  Oral Motor/Sensory Function Overall Oral Motor/Sensory Function:  (unable to assess as patient did not follow commands)            Talyn Eddie M.A., CCC-SLP 01/22/2024, 2:51 PM

## 2024-01-22 NOTE — Assessment & Plan Note (Signed)
 No chest pain.  Angioplasty on 07/2023  Continue aspirin , ticagrelor  and ezetimibe   Not on statin due to myalgias.

## 2024-01-22 NOTE — Assessment & Plan Note (Addendum)
 Calculated BMI is 32.6   OSA at home on Cpap/

## 2024-01-23 ENCOUNTER — Encounter (HOSPITAL_COMMUNITY): Payer: Self-pay | Admitting: Internal Medicine

## 2024-01-23 ENCOUNTER — Other Ambulatory Visit (HOSPITAL_COMMUNITY)

## 2024-01-23 ENCOUNTER — Encounter: Admitting: Registered Nurse

## 2024-01-23 ENCOUNTER — Inpatient Hospital Stay (HOSPITAL_COMMUNITY)

## 2024-01-23 DIAGNOSIS — G9341 Metabolic encephalopathy: Secondary | ICD-10-CM | POA: Diagnosis not present

## 2024-01-23 DIAGNOSIS — R569 Unspecified convulsions: Secondary | ICD-10-CM | POA: Diagnosis not present

## 2024-01-23 DIAGNOSIS — I5023 Acute on chronic systolic (congestive) heart failure: Secondary | ICD-10-CM | POA: Diagnosis not present

## 2024-01-23 DIAGNOSIS — E1022 Type 1 diabetes mellitus with diabetic chronic kidney disease: Secondary | ICD-10-CM | POA: Diagnosis not present

## 2024-01-23 DIAGNOSIS — R4182 Altered mental status, unspecified: Secondary | ICD-10-CM | POA: Diagnosis not present

## 2024-01-23 DIAGNOSIS — I25119 Atherosclerotic heart disease of native coronary artery with unspecified angina pectoris: Secondary | ICD-10-CM | POA: Diagnosis not present

## 2024-01-23 LAB — COMPREHENSIVE METABOLIC PANEL WITH GFR
ALT: 7 U/L (ref 0–44)
AST: 12 U/L — ABNORMAL LOW (ref 15–41)
Albumin: 2.3 g/dL — ABNORMAL LOW (ref 3.5–5.0)
Alkaline Phosphatase: 80 U/L (ref 38–126)
Anion gap: 15 (ref 5–15)
BUN: 43 mg/dL — ABNORMAL HIGH (ref 6–20)
CO2: 23 mmol/L (ref 22–32)
Calcium: 8.9 mg/dL (ref 8.9–10.3)
Chloride: 105 mmol/L (ref 98–111)
Creatinine, Ser: 2.23 mg/dL — ABNORMAL HIGH (ref 0.44–1.00)
GFR, Estimated: 25 mL/min — ABNORMAL LOW (ref >60)
Glucose, Bld: 138 mg/dL — ABNORMAL HIGH (ref 70–99)
Potassium: 3.5 mmol/L (ref 3.5–5.1)
Sodium: 143 mmol/L (ref 135–145)
Total Bilirubin: 1.3 mg/dL — ABNORMAL HIGH (ref 0.0–1.2)
Total Protein: 7.2 g/dL (ref 6.5–8.1)

## 2024-01-23 LAB — CBC
HCT: 29.8 % — ABNORMAL LOW (ref 36.0–46.0)
Hemoglobin: 9.2 g/dL — ABNORMAL LOW (ref 12.0–15.0)
MCH: 26.7 pg (ref 26.0–34.0)
MCHC: 30.9 g/dL (ref 30.0–36.0)
MCV: 86.4 fL (ref 80.0–100.0)
Platelets: 320 K/uL (ref 150–400)
RBC: 3.45 MIL/uL — ABNORMAL LOW (ref 3.87–5.11)
RDW: 18.1 % — ABNORMAL HIGH (ref 11.5–15.5)
WBC: 8.4 K/uL (ref 4.0–10.5)
nRBC: 0 % (ref 0.0–0.2)

## 2024-01-23 LAB — GLUCOSE, CAPILLARY
Glucose-Capillary: 127 mg/dL — ABNORMAL HIGH (ref 70–99)
Glucose-Capillary: 224 mg/dL — ABNORMAL HIGH (ref 70–99)
Glucose-Capillary: 264 mg/dL — ABNORMAL HIGH (ref 70–99)
Glucose-Capillary: 302 mg/dL — ABNORMAL HIGH (ref 70–99)
Glucose-Capillary: 244 mg/dL — ABNORMAL HIGH (ref 70–99)
Glucose-Capillary: 193 mg/dL — ABNORMAL HIGH (ref 70–99)
Glucose-Capillary: 160 mg/dL — ABNORMAL HIGH (ref 70–99)

## 2024-01-23 MED ORDER — LOSARTAN POTASSIUM 50 MG PO TABS
25.0000 mg | ORAL_TABLET | Freq: Every day | ORAL | Status: DC
  Administered 2024-01-23 – 2024-01-25 (×3): 25 mg via ORAL
  Filled 2024-01-23 (×3): qty 1

## 2024-01-23 MED ORDER — TORSEMIDE 20 MG PO TABS
20.0000 mg | ORAL_TABLET | Freq: Every day | ORAL | Status: DC
  Administered 2024-01-23 – 2024-01-25 (×3): 20 mg via ORAL
  Filled 2024-01-23 (×3): qty 1

## 2024-01-23 NOTE — Progress Notes (Signed)
 Patient ID: Dana [name], female   DOB: 07/20/1960, 63 y.o.   MRN: [id number] Patient is seen in follow-up status post left below-knee amputation patient is 4 weeks out from surgery.  The incision is well-healed.  Orders placed to harvest staples today.

## 2024-01-23 NOTE — Procedures (Signed)
 Patient Name: BERDIA LACHMAN  MRN: [id number]  Epilepsy Attending: Arlin MALVA Krebs  Referring Physician/Provider: Noralee Elidia Sieving, MD  Date: 01/23/2024  Duration: 24.18 mins  Patient history: 63yo F with ams. EEG to evaluate for seizure  Level of alertness: Awake  AEDs during EEG study: GBP  Technical aspects: This EEG study was done with scalp electrodes positioned according to the 10-20 International system of electrode placement. Electrical activity was reviewed with band pass filter of 1-70Hz , sensitivity of 7 uV/mm, display speed of 67mm/sec with a 60Hz  notched filter applied as appropriate. EEG data were recorded continuously and digitally stored.  Video monitoring was available and reviewed as appropriate.  Description: The posterior dominant rhythm consists of 7 Hz activity of moderate voltage (25-35 uV) seen predominantly in posterior head regions, symmetric and reactive to eye opening and eye closing. EEG showed intermittent generalized 3 to 6 Hz theta-delta slowing. Generalized periodic discharges with triphasic morphology at 1-2 Hz were noted intermittently. Hyperventilation and photic stimulation were not performed.     ABNORMALITY - Periodic discharges with triphasic morphology, generalized ( GPDs) - Intermittent slow, generalized  IMPRESSION: This study is suggestive of mild to moderate diffuse encephalopathy but likely related to toxic-metabolic etiology. No seizures or definite epileptiform discharges were seen throughout the recording.  Ramonita Koenig O Calel Pisarski

## 2024-01-23 NOTE — Progress Notes (Signed)
 Speech Language Pathology Treatment: Cognitive-Linguistic  Patient Details Name: Dana [name] MRN: [id number] DOB: 08-24-1960 Today's Date: 01/23/2024 Time: 1212-1220 SLP Time Calculation (min) (ACUTE ONLY): 8 min  Assessment / Plan / Recommendation Clinical Impression  Pt's cognitive-communicative function is significantly improved today.  She was oriented, followed complex commands, demonstrated functional problem-solving with use of menu.  Recall of new information was [facility].  Reading WFL. Communicating ideas coherently.  No further SLP f/u is needed- our service will sign off.   HPI HPI: 49F chronically ill with type 1 diabetes, CAD s/p PCI, HFmrEF, CKD stage IV, type 1 diabetes, anemia of chronic disease, depression/anxiety, chronic pain syndrome, OSA on CPAP, postoperative left ankle wound infection with MRSA bacteremia now s/p left BKA who initially presented to [facility] for shortness of breath. Significant change in mental status on 7/6 characterized by incessant crying, limited-no attempts at communication, and not following commands. MRI report states no significant abnormalities. Dx encephalopathy.      SLP Plan  All goals met          Recommendations   No SLP f/u                      Other (comment) (initial few days after D/C) Cognitive communication deficit (R41.841)     All goals met    Dana Geisen L. Vona, MA CCC/SLP Clinical Specialist - Acute Care SLP Acute Rehabilitation Services Office number [phone]  Dana [name]  01/23/2024, 12:22 PM

## 2024-01-23 NOTE — Plan of Care (Signed)
   Problem: Clinical Measurements: Goal: Will remain free from infection Outcome: Progressing   Problem: Clinical Measurements: Goal: Diagnostic test results will improve Outcome: Progressing   Problem: Clinical Measurements: Goal: Respiratory complications will improve Outcome: Progressing

## 2024-01-23 NOTE — Progress Notes (Signed)
 EEG complete, results are pending.

## 2024-01-23 NOTE — Progress Notes (Signed)
   01/23/24 2242  BiPAP/CPAP/SIPAP  BiPAP/CPAP/SIPAP Pt Type Adult  BiPAP/CPAP/SIPAP Resmed  Mask Type Nasal mask  Dentures removed? Not applicable  Mask Size Extra large  EPAP 8 cmH2O  FiO2 (%) 28 %  Flow Rate 2 lpm  Patient Home Machine Yes  Safety Check Completed by RT for Home Unit Yes, no issues noted  Patient Home Mask Yes  Patient Home Tubing Yes  Auto Titrate No  CPAP/SIPAP surface wiped down Yes  Device Plugged into RED Power Outlet Yes  BiPAP/CPAP /SiPAP Vitals  Pulse Rate 77  Resp 18  SpO2 100 %  Bilateral Breath Sounds Clear;Diminished  MEWS Score/Color  MEWS Score 0  MEWS Score Color Landy

## 2024-01-23 NOTE — Progress Notes (Signed)
 Progress Note   Patient: Dana [name] FMW:[id number] DOB: 1961-02-06 DOA: 01/20/2024     3 DOS: the patient was seen and examined on 01/23/2024   Brief hospital course: Dana [name] was admitted to the hospital with the working diagnosis of acute metabolic encephalopathy   63 yo female chronically ill with type 1 diabetes, CAD s/p PCI, HFmrEF, CKD stage IV, type 1 diabetes, anemia of chronic disease, depression/anxiety, chronic pain syndrome, OSA on CPAP,  s/p left BKA who initially presented to [facility] for shortness of breath.   Recent prolonged hospitalization 5/31-6/13 for severe sepsis, MRSA bacteremia left ankle infection underwent left BKA, TEE was negative for endocarditis completed 2 weeks of IV daptomycin , seen by psych for significant anxiety and depression related to amputation, discharged to CIR on 6/13 and then went home 6/26  Presented to [facility] on 7/3 with worsening dyspnea and cough, suspected to be from CHF and or pneumonia, treated with cefepime linezolid doxycycline and diuretics, 2 to 3 days ago was also noted to be more tired sleepy, less interactive and then 36 hours ago started crying incessantly, not community stating her needs are indicating pain or other symptoms, not eating or taking meds. -Had a Foley catheter placed few days ago for retention  07/07 transferred to [facility] for metabolic encephalopathy.   Studies at [facility]: CTA chest 01/17/2024: IMPRESSION: 1. Cardiac enlargement. Mitra-clip in place. Pulmonary arterial hypertension. CardioMEMS device in place. 2. No evidence of acute or chronic pulmonary embolus. 3. No aortic dissection or aneurysm. 4. Interstitial edema and early alveolar edema. 5. Shotty mediastinal lymph nodes are likely reactive    Renal ultrasound 01/19/2024: IMPRESSION: Mild medical renal disease. Foley in the urinary bladder.   CT head without contrast 01/20/2024: IMPRESSION: 1. No intracranial hemorrhage or ischemia at this  time. Small focus of encephalomalacia on the right at the external capsule. Stable. 2. Acute on chronic sinusitis. This has worsened.  07/10 Patient was placed on supportive medical care and clinically she has been improving. Possible discharge in the next 24 to 48 hrs.    Assessment and Plan: * Acute metabolic encephalopathy Etiology possible multifactorial, can not rule out psychiatric.  MRI With no acute changes.  EEG negative for seizures.   Today her mentation has improved and she is back to her baseline. Sudden improvement.  Plan to continue monitoring for 24 hrs more hours and possible discharge tomorrow.   Acute on chronic systolic CHF (congestive heart failure) (HCC) Echocardiogram with preserved LV systolic function with EF 40 to 45%, global hypokinesis, interventricular septum flattened in systole and diastole, RV systolic function low normal, RV enlarged, RVSP 65,7 mmHg, LA with mild dilatation, mild to moderate mitral regurgitation (sp mitral clip), moderate TR, moderate pulmonary regurgitation.   Volume status has improved Continue carvedilol  and resume torsemide  today.  Add losartan  for afterload reduction.   CKD stage 4 due to type 1 diabetes mellitus (HCC) - baseline 2.2 - 2.4 Hyponatremia.   Stable renal function and electrolytes, Serum cr is 2.23 with K at 3,5 and serum bicarbonate at 23  Na 143   Resume torsemide .  Foley catheter placed 07/06 due to urinary retention.  Will do voiding trial.   Coronary artery disease involving native coronary artery of native heart with angina pectoris (HCC) No chest pain.  Angioplasty on 07/2023  Continue aspirin , ticagrelor  and ezetimibe   Not on statin due to myalgias.   Uncontrolled type 1 diabetes mellitus with hyperglycemia, with long-term current use of  insulin  (HCC) Continue insulin  therapy, basal and short acting.  Fasting glucose this am 138 mg/dl   Depression with anxiety Continue with buspirone    Obesity,  class 1 Calculated BMI is 32.6   OSA at home on Cpap/       Subjective: patient today is more awake and alert, she is speaking and communicating back to baseline   Physical Exam: Vitals:   01/23/24 0524 01/23/24 0558 01/23/24 0802 01/23/24 1114  BP: (!) 157/64 (!) 157/64 (!) 128/50 (!) 149/62  Pulse: 93     Resp: 17  17 20   Temp: 97.6 F (36.4 C)  97.7 F (36.5 C) 97.8 F (36.6 C)  TempSrc: Oral  Oral Oral  SpO2: 100%  100% 100%  Weight:      Height:       Neurology awake and alert, responding to questions and follows commands, back to her baseline mentation  ENT with no pallor or icterus Cardiovascular with S1 and S2 present and regular with no gallops, rubs or murmurs Respiratory with no rales or wheezing, no rhonchi  Abdomen with no distention  No right lower extremity edema, left BKA   Data Reviewed:    Family Communication: I spoke with patient's daughter at the bedside, we talked in detail about patient's condition, plan of care and prognosis and all questions were addressed.   Disposition: Status is: Inpatient Remains inpatient appropriate because: recovering from encephalopathy   Planned Discharge Destination: Home     Author: Elidia Toribio Furnace, MD 01/23/2024 2:59 PM  For on call review [email or web address].

## 2024-01-23 NOTE — Plan of Care (Signed)

## 2024-01-24 DIAGNOSIS — G9341 Metabolic encephalopathy: Secondary | ICD-10-CM | POA: Diagnosis not present

## 2024-01-24 LAB — BASIC METABOLIC PANEL WITH GFR
Anion gap: 12 (ref 5–15)
BUN: 45 mg/dL — ABNORMAL HIGH (ref 6–20)
CO2: 23 mmol/L (ref 22–32)
Calcium: 8.4 mg/dL — ABNORMAL LOW (ref 8.9–10.3)
Chloride: 103 mmol/L (ref 98–111)
Creatinine, Ser: 2.12 mg/dL — ABNORMAL HIGH (ref 0.44–1.00)
GFR, Estimated: 26 mL/min — ABNORMAL LOW (ref >60)
Glucose, Bld: 148 mg/dL — ABNORMAL HIGH (ref 70–99)
Potassium: 4.3 mmol/L (ref 3.5–5.1)
Sodium: 138 mmol/L (ref 135–145)

## 2024-01-24 LAB — GLUCOSE, CAPILLARY
Glucose-Capillary: 106 mg/dL — ABNORMAL HIGH (ref 70–99)
Glucose-Capillary: 154 mg/dL — ABNORMAL HIGH (ref 70–99)
Glucose-Capillary: 177 mg/dL — ABNORMAL HIGH (ref 70–99)
Glucose-Capillary: 199 mg/dL — ABNORMAL HIGH (ref 70–99)
Glucose-Capillary: 233 mg/dL — ABNORMAL HIGH (ref 70–99)
Glucose-Capillary: 255 mg/dL — ABNORMAL HIGH (ref 70–99)

## 2024-01-24 LAB — MAGNESIUM: Magnesium: 2 mg/dL (ref 1.7–2.4)

## 2024-01-24 MED ORDER — DM-GUAIFENESIN ER 30-600 MG PO TB12
1.0000 | ORAL_TABLET | Freq: Two times a day (BID) | ORAL | Status: DC
  Administered 2024-01-24 – 2024-01-25 (×2): 1 via ORAL
  Filled 2024-01-24 (×3): qty 1

## 2024-01-24 NOTE — Plan of Care (Signed)

## 2024-01-24 NOTE — Progress Notes (Signed)
  Progress Note   Patient: Dana [name] FMW:[id number] DOB: Jun 28, 1961 DOA: 01/20/2024     4 DOS: the patient was seen and examined on 01/24/2024   Brief hospital course: Patient is a 63 year old female with with past medical history significant for type 1 diabetes mellitus, coronary tree disease s/p PCI, heart failure, CKD stage IV, anemia, OSA on CPAP, chronic pain syndrome and recent left BKA (about 2 months ago).  Patient was admitted with acute metabolic encephalopathy.  Assessment and Plan: Acute metabolic encephalopathy: Etiology possible multifactorial, can not rule out psychiatric.  MRI With no acute changes.  EEG negative for seizures.  01/24/2024: Patient is not keen on being discharged back on today.  Possible this  Obesity, class 1 Calculated BMI is 32.6   OSA at home on Cpap/   Acute on chronic systolic CHF (congestive heart failure) (HCC) Echocardiogram with preserved LV systolic function with EF 40 to 45%, global hypokinesis, interventricular septum flattened in systole and diastole, RV systolic function low normal, RV enlarged, RVSP 65,7 mmHg, LA with mild dilatation, mild to moderate mitral regurgitation (sp mitral clip), moderate TR, moderate pulmonary regurgitation.   Volume status has improved Continue carvedilol  and resume torsemide  today.  Add losartan  for afterload reduction.   Depression with anxiety Continue with buspirone    Coronary artery disease involving native coronary artery of native heart with angina pectoris (HCC) No chest pain.  Angioplasty on 07/2023  Continue aspirin , ticagrelor  and ezetimibe   Not on statin due to myalgias.   CKD stage 4 due to type 1 diabetes mellitus (HCC) - baseline 2.2 - 2.4 Hyponatremia.   Stable renal function and electrolytes, Serum cr is 2.23 with K at 3,5 and serum bicarbonate at 23  Na 143   Resume torsemide .  Foley catheter placed 07/06 due to urinary retention.  Will do voiding trial.   Uncontrolled type 1  diabetes mellitus with hyperglycemia, with long-term current use of insulin  (HCC) Continue insulin  therapy, basal and short acting.  Fasting glucose this am 138 mg/dl       Subjective: - No new complaints. - Not keen on being discharged.    Physical Exam: Vitals:   01/24/24 0443 01/24/24 0500 01/24/24 0825 01/24/24 1608  BP: 123/61  133/61 (!) 112/53  Pulse: 80  86 76  Resp: 18  18 18   Temp: 98 F (36.7 C)  97.7 F (36.5 C) 98 F (36.7 C)  TempSrc: Oral  Oral Oral  SpO2: 99%  91% 90%  Weight:  90.9 kg    Height:       General Condition: Patient is obese.  Not in any distress.  Patient status left BKA. Neck: Supple.  JVD difficult to assess. Lungs: Right chest lower lobe lobe crackles posteriorly. CVS: S1-S2. Abdomen: Obese, soft and nontender. Neuro: Awake and alert. Extremities: Left BKA.   Data Reviewed:    Family Communication: Disposition: Status is: Inpatient Remains inpatient appropriate because: recovering from encephalopathy   Planned Discharge Destination: Home     Author: Leatrice LILLETTE Chapel, MD 01/24/2024 4:25 PM  For on call review [email or web address].

## 2024-01-24 NOTE — Care Management Important Message (Signed)
 Important Message  Patient Details  Name: Dana [name] MRN: [id number] Date of Birth: Nov 16, 1960   Important Message Given:  Yes - Medicare IM     Claretta Deed 01/24/2024, 4:21 PM

## 2024-01-24 NOTE — Plan of Care (Signed)
 Understanding verbalized.

## 2024-01-24 NOTE — Progress Notes (Signed)
 [facility] night cross cover note:   I was notified by the patient's RN that this patient underwent removal of Foley catheter earlier today, and has not subsequently voided.  Bladder scan at this time reveals 413 cc's.   I subsequently placed order for In/Out catheter x 1 now.     Eva Pore, DO Hospitalist

## 2024-01-25 ENCOUNTER — Other Ambulatory Visit (HOSPITAL_COMMUNITY): Payer: Self-pay

## 2024-01-25 ENCOUNTER — Inpatient Hospital Stay (HOSPITAL_COMMUNITY)

## 2024-01-25 DIAGNOSIS — G9341 Metabolic encephalopathy: Secondary | ICD-10-CM | POA: Diagnosis not present

## 2024-01-25 LAB — RENAL FUNCTION PANEL
Albumin: 2.3 g/dL — ABNORMAL LOW (ref 3.5–5.0)
Anion gap: 11 (ref 5–15)
BUN: 54 mg/dL — ABNORMAL HIGH (ref 6–20)
CO2: 24 mmol/L (ref 22–32)
Calcium: 8.3 mg/dL — ABNORMAL LOW (ref 8.9–10.3)
Chloride: 101 mmol/L (ref 98–111)
Creatinine, Ser: 2.64 mg/dL — ABNORMAL HIGH (ref 0.44–1.00)
GFR, Estimated: 20 mL/min — ABNORMAL LOW
Glucose, Bld: 163 mg/dL — ABNORMAL HIGH (ref 70–99)
Phosphorus: 3.6 mg/dL (ref 2.5–4.6)
Potassium: 3.7 mmol/L (ref 3.5–5.1)
Sodium: 136 mmol/L (ref 135–145)

## 2024-01-25 LAB — CULTURE, BLOOD (ROUTINE X 2)
Culture: NO GROWTH
Culture: NO GROWTH
Special Requests: ADEQUATE

## 2024-01-25 LAB — GLUCOSE, CAPILLARY
Glucose-Capillary: 172 mg/dL — ABNORMAL HIGH (ref 70–99)
Glucose-Capillary: 145 mg/dL — ABNORMAL HIGH (ref 70–99)
Glucose-Capillary: 136 mg/dL — ABNORMAL HIGH (ref 70–99)

## 2024-01-25 MED ORDER — NALOXONE HCL 4 MG/0.1ML NA LIQD
NASAL | 1 refills | Status: DC
  Filled 2024-01-25: qty 2, 7d supply, fill #0

## 2024-01-25 MED ORDER — HYDROMORPHONE HCL 1 MG/ML IJ SOLN
0.5000 mg | Freq: Once | INTRAMUSCULAR | Status: AC
  Administered 2024-01-25: 0.5 mg via INTRAVENOUS
  Filled 2024-01-25: qty 0.5

## 2024-01-25 MED ORDER — ACETAMINOPHEN 325 MG PO TABS
650.0000 mg | ORAL_TABLET | Freq: Four times a day (QID) | ORAL | Status: AC | PRN
Start: 2024-01-25 — End: ?

## 2024-01-25 MED ORDER — SENNOSIDES-DOCUSATE SODIUM 8.6-50 MG PO TABS
1.0000 | ORAL_TABLET | Freq: Every evening | ORAL | 0 refills | Status: DC | PRN
  Filled 2024-01-25: qty 30, 30d supply, fill #0

## 2024-01-25 MED ORDER — NALOXONE HCL 0.4 MG/ML IJ SOLN: 0.4000 mg | INTRAMUSCULAR | Status: DC | PRN

## 2024-01-25 MED ORDER — LIDOCAINE 5 % EX PTCH
1.0000 | MEDICATED_PATCH | CUTANEOUS | Status: DC
  Administered 2024-01-25: 1 via TRANSDERMAL
  Filled 2024-01-25: qty 1

## 2024-01-25 MED ORDER — NEPRO/CARBSTEADY PO LIQD
237.0000 mL | Freq: Two times a day (BID) | ORAL | 1 refills | Status: DC
  Filled 2024-01-25: qty 7110, 15d supply, fill #0

## 2024-01-25 MED ORDER — JUVEN PO PACK
1.0000 | PACK | Freq: Two times a day (BID) | ORAL | 2 refills | Status: DC
  Filled 2024-01-25: qty 30, 15d supply, fill #0

## 2024-01-25 NOTE — Progress Notes (Signed)
 Occupational Therapy Evaluation Patient Details Name: Dana [name] MRN: [id number] DOB: 06/16/1961 Today's Date: 01/25/2024   History of Present Illness   Pt is 63 year old presented to [facility] on 01/16/24  with PNA, sepsis, chf exacerbation. On 7/6 pt became lethargic and then AMS with nonstop crying. Transferred to [facility] on  01/20/24 for encephalopathy. PMH - Lt BKA 12/18/23 with CIR stay, CM, rheumatoid arthritis, neuropathy, chf, CAD     Clinical Impressions PT admitted with AMS encephalopathy. . Pt currently with functional limitiations due to the deficits listed below (see OT problem list). Pt demonstrates correct awareness to utilize sliding board and LB dressing. Pt deconditioned and needs increased activity tolerance for home d/c .  Pt will benefit from skilled OT to increase their independence and safety with adls and balance to allow discharge HHOT.      If plan is discharge home, recommend the following:   A little help with bathing/dressing/bathroom     Functional Status Assessment   Patient has had a recent decline in their functional status and demonstrates the ability to make significant improvements in function in a reasonable and predictable amount of time.     Equipment Recommendations   None recommended by OT (recent d/c from [facility] with all dme)     Recommendations for Other Services         Precautions/Restrictions   Precautions Precautions: Fall;Other (comment) Recall of Precautions/Restrictions: Intact Precaution/Restrictions Comments: L limb protector Restrictions Weight Bearing Restrictions Per Provider Order: Yes LLE Weight Bearing Per Provider Order: Non weight bearing     Mobility Bed Mobility Overal bed mobility: Needs Assistance Bed Mobility: Supine to Sit     Supine to sit: Supervision, HOB elevated     General bed mobility comments: Incr time and effort    Transfers Overall transfer level: Needs assistance Equipment  used: Sliding board Transfers: Bed to chair/wheelchair/BSC            Lateral/Scoot Transfers: +2 physical assistance, Min assist, Contact guard assist General transfer comment: Bed to w/c to bsc to recliner with +2 min/CGA with sliding board. Primarily assist for safety due to environmental factors (no drop arm recliner, pt without pants/shorts on so required use of bed pad or sheet over sliding board, risk of equipment sliding.      Balance Overall balance assessment: Needs assistance Sitting-balance support: No upper extremity supported, Feet unsupported, Feet supported Sitting balance-Leahy Scale: Good                                     ADL either performed or assessed with clinical judgement   ADL Overall ADL's : Needs assistance/impaired                     Lower Body Dressing: Minimal assistance Lower Body Dressing Details (indicate cue type and reason): pt able to don RLE socks shoe, LLE sock and limb protector. pt needs (A) to pull up brief in chair Toilet Transfer: +2 for physical assistance;+2 for safety/equipment;Contact guard assist   Toileting- Clothing Manipulation and Hygiene: Supervision/safety;Adhering to back precautions         General ADL Comments: transfered bed to w/c to [facility] to recliner     Vision Baseline Vision/History: 1 Wears glasses Vision Assessment?: No apparent visual deficits     Perception         Praxis  Pertinent Vitals/Pain Pain Assessment Pain Assessment: 0-10 Pain Score: 9  Pain Location: rt low back Pain Descriptors / Indicators: Aching, Grimacing Pain Intervention(s): Monitored during session, Premedicated before session, Repositioned, Limited activity within patient's tolerance     Extremity/Trunk Assessment Upper Extremity Assessment Upper Extremity Assessment: Overall WFL for tasks assessed   Lower Extremity Assessment Lower Extremity Assessment: Defer to PT evaluation LLE Deficits /  Details: BKA   Cervical / Trunk Assessment Cervical / Trunk Assessment: Kyphotic   Communication Communication Communication: No apparent difficulties   Cognition Arousal: Alert Behavior During Therapy: Flat affect Cognition: Cognition impaired     Awareness: Intellectual awareness intact, Online awareness intact     Executive functioning impairment (select all impairments): Problem solving OT - Cognition Comments: pt quiet at times and more verbal with daughter arrival. pt reports possible d/c home today.additional cues at time to sequence                 Following commands: Intact       Cueing  General Comments   Cueing Techniques: Verbal cues;Tactile cues  nausea reported and medicated prior to 7AM. void of bladder / bowel with RN aware   Exercises     Shoulder Instructions      Home Living Family/patient expects to be discharged to:: Private residence Living Arrangements: Spouse/significant other;Parent Available Help at Discharge: Family;Available 24 hours/day Type of Home: House Home Access: Ramped entrance     Home Layout: Two level;Laundry or work area in Artist of Steps: 13 Alternate Level Stairs-Rails: Right Bathroom Shower/Tub: Producer, television/film/video: Handicapped height (has toilet riser) Bathroom Accessibility: Yes   Home Equipment: Agricultural consultant (2 wheels);Shower seat;Grab bars - tub/shower;Hand held shower head;Cane - single point;Wheelchair - manual;BSC/3in1;Rollator (4 wheels);Hospital bed;Other (comment) (drop arm bsc, sliding board)          Prior Functioning/Environment Prior Level of Function : Needs assist;History of Falls (last six months)             Mobility Comments: Modified independent to supervision with sliding board transfers to/from w/c. Not standing      OT Problem List: Decreased strength;Decreased activity tolerance;Decreased safety awareness;Obesity   OT  Treatment/Interventions: Self-care/ADL training;Therapeutic exercise;Energy conservation;DME and/or AE instruction;Therapeutic activities;Patient/family education      OT Goals(Current goals can be found in the care plan section)   Acute Rehab OT Goals Patient Stated Goal: none stated OT Goal Formulation: With patient Time For Goal Achievement: 02/08/24 Potential to Achieve Goals: Good   OT Frequency:  Min 2X/week    Co-evaluation PT/OT/SLP Co-Evaluation/Treatment: Yes Reason for Co-Treatment: To address functional/ADL transfers PT goals addressed during session: Mobility/safety with mobility;Balance OT goals addressed during session: ADL's and self-care      AM-PAC OT 6 Clicks Daily Activity     Outcome Measure Help from another person eating meals?: None Help from another person taking care of personal grooming?: None Help from another person toileting, which includes using toliet, bedpan, or urinal?: A Little Help from another person bathing (including washing, rinsing, drying)?: A Little Help from another person to put on and taking off regular upper body clothing?: A Little Help from another person to put on and taking off regular lower body clothing?: A Little 6 Click Score: 20   End of Session Equipment Utilized During Treatment: Other (comment) (w/c sliding board) Nurse Communication: Mobility status;Precautions;Weight bearing status  Activity Tolerance: Patient tolerated treatment well Patient left: in chair;with call bell/phone within reach;with  chair alarm set;with nursing/sitter in room  OT Visit Diagnosis: Unsteadiness on feet (R26.81);Muscle weakness (generalized) (M62.81)                Time: 8965-8883 OT Time Calculation (min): 42 min Charges:  OT General Charges $OT Visit: 1 Visit OT Evaluation $OT Eval Moderate Complexity: 1 Mod OT Treatments $Self Care/Home Management : 8-22 mins   Brynn, OTR/L  Acute Rehabilitation Services Office:  [phone] .   Ely Molt 01/25/2024, 11:58 AM

## 2024-01-25 NOTE — Plan of Care (Addendum)
 Patient with increased pain and physician had to be notified due to patient crying in pain at 10/10.  Patient sleeping in bed, no distress noted. Medication for pain effective at this time. SR up and call light in reach. Continue to assess patient frequently.

## 2024-01-25 NOTE — Evaluation (Signed)
 Physical Therapy Evaluation Patient Details Name: Dana [name] MRN: [id number] DOB: [birth year] Today's Date: 01/25/2024  History of Present Illness  Pt is 63 year old presented to [facility] on 01/16/24  with PNA, sepsis, chf exacerbation. On 7/6 pt became lethargic and then AMS with nonstop crying. Transferred to [facility] on  01/20/24 for encephalopathy. PMH - Lt BKA 12/18/23 with CIR stay, CM, rheumatoid arthritis, neuropathy, chf, CAD  Clinical Impression  Pt from home where she was fairly independent at w/c level since recent DC from inpatient rehab. At baseline pt uses a sliding board for transfers. Today pt able to transfer from bed to w/c to bsc to recliner. Pt is requiring more assist than baseline primarily due to environmental factors on the unit that make mobility harder than at home. Examples: pt didn't have shorts/pants on meaning we had to use a bed pad or sheet over the sliding board to allow her to slide; drop arm recliners not available on the unit; w/c didn't have elevating legrest to support her lt residual limb. Additionally pt is deconditioned from a week in bed. Pt should do better in home environment. Will initially need some assist with transfers. Recommend resumption of HHPT at DC.         If plan is discharge home, recommend the following: A little help with walking and/or transfers;A little help with bathing/dressing/bathroom;Help with stairs or ramp for entrance;Assist for transportation   Can travel by private vehicle        Equipment Recommendations None recommended by PT  Recommendations for Other Services       Functional Status Assessment Patient has had a recent decline in their functional status and demonstrates the ability to make significant improvements in function in a reasonable and predictable amount of time.     Precautions / Restrictions Precautions Precautions: Fall;Other (comment) Recall of Precautions/Restrictions:  Intact Precaution/Restrictions Comments: L limb protector      Mobility  Bed Mobility Overal bed mobility: Needs Assistance Bed Mobility: Supine to Sit     Supine to sit: Supervision, HOB elevated     General bed mobility comments: Incr time and effort    Transfers Overall transfer level: Needs assistance Equipment used: Sliding board Transfers: Bed to chair/wheelchair/BSC            Lateral/Scoot Transfers: +2 physical assistance, Min assist, Contact guard assist General transfer comment: Bed to w/c to bsc to recliner with +2 min/CGA with sliding board. Primarily assist for safety due to environmental factors (no drop arm recliner, pt without pants/shorts on so required use of bed pad or sheet over sliding board, risk of equipment sliding.    Ambulation/Gait                  Stairs            Wheelchair Mobility     Tilt Bed    Modified Rankin (Stroke Patients Only)       Balance Overall balance assessment: Needs assistance Sitting-balance support: No upper extremity supported, Feet unsupported, Feet supported Sitting balance-Leahy Scale: Good                                       Pertinent Vitals/Pain Pain Assessment Pain Assessment: 0-10 Pain Score: 9  Pain Location: rt low back Pain Descriptors / Indicators: Aching, Grimacing Pain Intervention(s): Limited activity within patient's tolerance, Premedicated before session, Repositioned,  Monitored during session    Home Living Family/patient expects to be discharged to:: Private residence Living Arrangements: Spouse/significant other;Parent Available Help at Discharge: Family;Available 24 hours/day Type of Home: House Home Access: Ramped entrance     Alternate Level Stairs-Number of Steps: 13 Home Layout: Two level;Laundry or work area in [employer] Equipment: Agricultural consultant (2 wheels);Shower seat;Grab bars - tub/shower;Hand held shower head;Cane - single  point;Wheelchair - manual;BSC/3in1;Rollator (4 wheels);Hospital bed;Other (comment) (drop arm bsc, sliding board)      Prior Function Prior Level of Function : Needs assist;History of Falls (last six months)             Mobility Comments: Modified independent to supervision with sliding board transfers to/from w/c. Not standing       Extremity/Trunk Assessment   Upper Extremity Assessment Upper Extremity Assessment: Defer to OT evaluation    Lower Extremity Assessment Lower Extremity Assessment: Generalized weakness;LLE deficits/detail LLE Deficits / Details: BKA       Communication   Communication Communication: No apparent difficulties    Cognition Arousal: Alert Behavior During Therapy: Flat affect   PT - Cognitive impairments: Problem solving                       PT - Cognition Comments: Slower processing Following commands: Intact       Cueing Cueing Techniques: Verbal cues, Tactile cues     General Comments General comments (skin integrity, edema, etc.): Pt reported nausea during session. Urinated and had BM on commode.    Exercises     Assessment/Plan    PT Assessment Patient needs continued PT services  PT Problem List Decreased strength;Decreased balance;Decreased mobility;Pain       PT Treatment Interventions DME instruction;Functional mobility training;Therapeutic activities;Therapeutic exercise;Balance training;Patient/family education;Wheelchair mobility training    PT Goals (Current goals can be found in the Care Plan section)  Acute Rehab PT Goals Patient Stated Goal: get better PT Goal Formulation: With patient Time For Goal Achievement: 02/01/24 Potential to Achieve Goals: Good    Frequency Min 2X/week     Co-evaluation PT/OT/SLP Co-Evaluation/Treatment: Yes Reason for Co-Treatment: To address functional/ADL transfers PT goals addressed during session: Mobility/safety with mobility;Balance         AM-PAC PT 6  Clicks Mobility  Outcome Measure Help needed turning from your back to your side while in a flat bed without using bedrails?: None Help needed moving from lying on your back to sitting on the side of a flat bed without using bedrails?: A Little Help needed moving to and from a bed to a chair (including a wheelchair)?: A Little Help needed standing up from a chair using your arms (e.g., wheelchair or bedside chair)?: Total Help needed to walk in hospital room?: Total Help needed climbing 3-5 steps with a railing? : Total 6 Click Score: 13    End of Session Equipment Utilized During Treatment: Other (comment) (lt limb protector) Activity Tolerance: Patient tolerated treatment well Patient left: in chair;with call bell/phone within reach;with chair alarm set;with family/visitor present Nurse Communication: Mobility status PT Visit Diagnosis: Other abnormalities of gait and mobility (R26.89);Muscle weakness (generalized) (M62.81)    Time: 8978-8882 PT Time Calculation (min) (ACUTE ONLY): 56 min   Charges:   PT Evaluation $PT Eval Moderate Complexity: 1 Mod PT Treatments $Wheel Chair Management: 8-22 mins PT General Charges $$ ACUTE PT VISIT: 1 Visit         [facility] PT Acute Rehabilitation Services Office [phone]  Rodgers ORN [facility] 01/25/2024, 11:48 AM

## 2024-01-25 NOTE — Discharge Summary (Signed)
 Physician Discharge Summary  Patient ID: Dana [name] MRN: [id number] DOB/AGE: June 24, 1961 63 y.o.  Admit date: 01/20/2024 Discharge date: 01/25/2024  Admission Diagnoses:  Discharge Diagnoses:  Principal Problem:   Acute metabolic encephalopathy Active Problems:   Uncontrolled type 1 diabetes mellitus with hyperglycemia, with long-term current use of insulin  (HCC)   CKD stage 4 due to type 1 diabetes mellitus (HCC) - baseline 2.2 - 2.4   Coronary artery disease involving native coronary artery of native heart with angina pectoris (HCC)   Depression with anxiety   Acute on chronic systolic CHF (congestive heart failure) (HCC)   Obesity, class 1   Discharged Condition: {condition:18240}  Hospital Course: ***  Consults: {consultation:18241}  Significant Diagnostic Studies: {diagnostics:18242}  Treatments: {Tx:18249}  Discharge Exam: Blood pressure 128/60, pulse 72, temperature 97.9 F (36.6 C), temperature source Oral, resp. rate 19, height 5' 5 (1.651 m), weight 93 kg, SpO2 90%. {physical zkjf:6958869}  Disposition: Discharge disposition: 01-Home or Self Care       Discharge Instructions     Diet - low sodium heart healthy   Complete by: As directed    Increase activity slowly   Complete by: As directed       Allergies as of 01/25/2024       Reactions   Naproxen Hives, Rash   Shellfish-derived Products Hives, Swelling, Other (See Comments)   Angioedema   Strawberry (diagnostic) Hives   Celecoxib Other (See Comments)   Stomach pain   Doxepin  Hcl Other (See Comments)   Lethargic, sleeping a lot per pt and family   Glucosamine Hives, Swelling, Other (See Comments)   Angioedema   Iron  Hives, Other (See Comments)   IV iron  transfusion - hives - Feb 2022 hospitalization Patient said she CAN tolerate if given Benadryl  first   Metoclopramide Other (See Comments)   Facial twitching and stuttering   Metolazone  Other (See Comments)   Acute renal failure    Statins Other (See Comments)   Muscle pain        Medication List     STOP taking these medications    cephALEXin  500 MG capsule Commonly known as: KEFLEX    FISH OIL PO   LORazepam  0.5 MG tablet Commonly known as: ATIVAN    magnesium  oxide 400 MG tablet Commonly known as: MAG-OX   potassium chloride  SA 20 MEQ tablet Commonly known as: KLOR-CON  M   Probiotic Caps       TAKE these medications    acetaminophen  325 MG tablet Commonly known as: TYLENOL  Take 2 tablets (650 mg total) by mouth every 6 (six) hours as needed for mild pain (pain score 1-3) or fever (or Fever >/= 101). What changed:  medication strength how much to take reasons to take this   albuterol  108 (90 Base) MCG/ACT inhaler Commonly known as: VENTOLIN  HFA Inhale 2 puffs into the lungs every 6 (six) hours as needed for wheezing or shortness of breath.   allopurinol  100 MG tablet Commonly known as: ZYLOPRIM  Take 0.5 tablets (50 mg total) by mouth daily.   Aspirin  Low Dose 81 MG tablet Generic drug: aspirin  EC Take 1 tablet (81 mg total) by mouth daily. Swallow whole.   BD Pen Needle Nano 2nd Gen 32G X 4 MM Misc Generic drug: Insulin  Pen Needle Inject 1 each into the skin in the morning, at noon, in the evening, and at bedtime.   TechLite Pen Needles 32G X 4 MM Misc Generic drug: Insulin  Pen Needle Use with Lantus  pen  busPIRone  15 MG tablet Commonly known as: BUSPAR  Take 1 tablet (15 mg total) by mouth 3 (three) times daily.   carvedilol  6.25 MG tablet Commonly known as: COREG  Take 6.25 mg by mouth 2 (two) times daily with a meal.   cholecalciferol  25 MCG (1000 UNIT) tablet Commonly known as: VITAMIN D3 Take 2 tablets (2,000 Units total) by mouth 2 (two) times daily.   diclofenac  Sodium 1 % Gel Commonly known as: VOLTAREN  Apply 2 g topically 4 (four) times daily.   ezetimibe  10 MG tablet Commonly known as: ZETIA  Take 1 tablet (10 mg total) by mouth daily.   feeding supplement  (NEPRO CARB STEADY) Liqd Take 237 mLs by mouth 2 (two) times daily between meals. Start taking on: January 26, 2024   nutrition supplement (JUVEN) Pack Take 1 packet by mouth 2 (two) times daily between meals. Start taking on: January 26, 2024   FreeStyle Libre 2 Sensor Misc Inject 1 device into skin every 14 (fourteen) days.   gabapentin  300 MG capsule Commonly known as: Neurontin  Take 1 capsule (300 mg total) by mouth 2 (two) times daily.   Gvoke HypoPen  2-Pack 0.5 MG/0.1ML Soaj Generic drug: Glucagon  Inject 0.5 mg into the skin daily as needed.   Icy Hot Extra Strength 10-30 % Crea Apply 1 Application topically as needed (bilateral hips and knees).   Lantus  SoloStar 100 UNIT/ML Solostar Pen Generic drug: insulin  glargine Inject 5 Units into the skin daily. Expires 28 days after first use   latanoprost  0.005 % ophthalmic solution Commonly known as: XALATAN  Place 1 drop into both eyes at bedtime.   levocetirizine 5 MG tablet Commonly known as: XYZAL  Take 1 tablet (5 mg total) by mouth every evening.   lidocaine  4 % Place 1 patch onto the skin daily. Remove & Discard patch within 12 hours or as directed by MD   Linzess  145 MCG Caps capsule Generic drug: linaclotide  Take 1 capsule (145 mcg total) by mouth daily before breakfast.   naloxone  4 MG/0.1ML Liqd nasal spray kit Commonly known as: NARCAN  For opioid overdose   nitroGLYCERIN  0.4 MG SL tablet Commonly known as: NITROSTAT  DISSOLVE ONE TABLET UNDER THE TONGUE EVERY 5 MINUTES AS NEEDED FOR CHEST PAIN.  DO NOT EXCEED A TOTAL OF 3 DOSES IN 15 MINUTES   NovoLOG  FlexPen 100 UNIT/ML FlexPen Generic drug: insulin  aspart Inject 2 - 10 Units before meals based on SSI.   oxyCODONE  5 MG immediate release tablet Commonly known as: Roxicodone  Take 1 tablet (5 mg total) by mouth every 4 (four) hours as needed for severe pain (pain score 7-10).   OXYGEN  Inhale 2 L/min into the lungs at bedtime.   pantoprazole  40 MG  tablet Commonly known as: PROTONIX  Take 1 tablet (40 mg total) by mouth daily.   polyethylene glycol powder 17 GM/SCOOP powder Commonly known as: GLYCOLAX /MIRALAX  Take 1 capful (17 g) with water by mouth daily.   Repatha  SureClick 140 MG/ML Soaj Generic drug: Evolocumab  Inject 140 mg into the skin every 14 (fourteen) days.   senna-docusate 8.6-50 MG tablet Commonly known as: Senokot-S Take 1 tablet by mouth at bedtime as needed for mild constipation.   ticagrelor  90 MG Tabs tablet Commonly known as: BRILINTA  Take 1 tablet (90 mg total) by mouth 2 (two) times daily.   torsemide  20 MG tablet Commonly known as: DEMADEX  Take 1 tablet (20 mg total) by mouth daily.        Follow-up Information     Harden Jerona GAILS, MD  Follow up in 1 week(s).   Specialty: Orthopedic Surgery Contact information: [address] [place] [address] [phone]                 Signed: Leatrice LILLETTE Chapel 01/25/2024, 2:04 PM

## 2024-01-25 NOTE — Plan of Care (Signed)
  Problem: Education: Goal: Knowledge of General Education information will improve Description: Including pain rating scale, medication(s)/side effects and non-pharmacologic comfort measures Outcome: Adequate for Discharge   Problem: Health Behavior/Discharge Planning: Goal: Ability to manage health-related needs will improve Outcome: Adequate for Discharge   Problem: Clinical Measurements: Goal: Ability to maintain clinical measurements within normal limits will improve Outcome: Adequate for Discharge Goal: Will remain free from infection Outcome: Adequate for Discharge Goal: Diagnostic test results will improve Outcome: Adequate for Discharge Goal: Respiratory complications will improve Outcome: Adequate for Discharge Goal: Cardiovascular complication will be avoided Outcome: Adequate for Discharge   Problem: Activity: Goal: Risk for activity intolerance will decrease Outcome: Adequate for Discharge   Problem: Nutrition: Goal: Adequate nutrition will be maintained Outcome: Adequate for Discharge   Problem: Coping: Goal: Level of anxiety will decrease Outcome: Adequate for Discharge   Problem: Elimination: Goal: Will not experience complications related to bowel motility Outcome: Adequate for Discharge Goal: Will not experience complications related to urinary retention Outcome: Adequate for Discharge   Problem: Pain Managment: Goal: General experience of comfort will improve and/or be controlled Outcome: Adequate for Discharge   Problem: Safety: Goal: Ability to remain free from injury will improve Outcome: Adequate for Discharge   Problem: Skin Integrity: Goal: Risk for impaired skin integrity will decrease Outcome: Adequate for Discharge   Problem: Education: Goal: Ability to describe self-care measures that may prevent or decrease complications (Diabetes Survival Skills Education) will improve Outcome: Adequate for Discharge Goal: Individualized Educational  Video(s) Outcome: Adequate for Discharge   Problem: Coping: Goal: Ability to adjust to condition or change in health will improve Outcome: Adequate for Discharge   Problem: Fluid Volume: Goal: Ability to maintain a balanced intake and output will improve Outcome: Adequate for Discharge   Problem: Health Behavior/Discharge Planning: Goal: Ability to identify and utilize available resources and services will improve Outcome: Adequate for Discharge Goal: Ability to manage health-related needs will improve Outcome: Adequate for Discharge   Problem: Metabolic: Goal: Ability to maintain appropriate glucose levels will improve Outcome: Adequate for Discharge   Problem: Nutritional: Goal: Maintenance of adequate nutrition will improve Outcome: Adequate for Discharge Goal: Progress toward achieving an optimal weight will improve Outcome: Adequate for Discharge   Problem: Skin Integrity: Goal: Risk for impaired skin integrity will decrease Outcome: Adequate for Discharge   Problem: Tissue Perfusion: Goal: Adequacy of tissue perfusion will improve Outcome: Adequate for Discharge   Problem: Inadequate Intake (NI-2.1) Goal: Food and/or nutrient delivery Description: Individualized approach for food/nutrient provision. Outcome: Adequate for Discharge   Problem: SLP Language Goals Goal: Patient will communicate needs/wants with Outcome: Adequate for Discharge   Problem: Acute Rehab PT Goals(only PT should resolve) Goal: Pt Will Go Supine/Side To Sit Outcome: Adequate for Discharge Goal: Pt Will Go Sit To Supine/Side Outcome: Adequate for Discharge Goal: Pt Will Transfer Bed To Chair/Chair To Bed Outcome: Adequate for Discharge   Problem: Acute Rehab OT Goals (only OT should resolve) Goal: Pt. Will Transfer To Toilet Outcome: Adequate for Discharge Goal: OT Additional ADL Goal #1 Outcome: Adequate for Discharge

## 2024-01-25 NOTE — Progress Notes (Signed)
 [facility] night cross cover note:   I was notified by the patient's RN that the patient is reporting breakthrough back discomfort while on Norco 7.5/325 mg p.o. every 8 hours as needed.  She also has an independent order for as needed acetaminophen , which is also not offered significant improvement in degree of back pain.   I subsequently ordered a one-time dose of Dilaudid  0.5 mg IV x 1 dose now and added a lidocaine  patch to be applied to the affected area of the back. I also placed order for prn Narcan  and ordered incentive spirometry.     Eva Pore, DO Hospitalist

## 2024-01-26 ENCOUNTER — Ambulatory Visit: Payer: Self-pay | Admitting: Family Medicine

## 2024-01-26 DIAGNOSIS — J309 Allergic rhinitis, unspecified: Secondary | ICD-10-CM | POA: Insufficient documentation

## 2024-01-26 DIAGNOSIS — R051 Acute cough: Secondary | ICD-10-CM | POA: Insufficient documentation

## 2024-01-26 NOTE — Assessment & Plan Note (Signed)
 Cough likely due to postnasal drip from allergic rhinitis. Previous Tessalon  Perles ineffective. Past codeine-containing syrup effective. - Review past prescriptions for codeine-containing cough syrup and prescribe if appropriate.

## 2024-01-26 NOTE — Assessment & Plan Note (Signed)
 Symptoms suggest allergies as primary cause. Xyzal  recommended for allergy  relief. Safe for short-term use in CHF patients. - Prescribe albuterol  inhaler every six hours as needed. - Recommend over-the-counter Xyzal  (levocetirizine). Attempt prescription for insurance coverage. - Order chest x-ray if symptoms persist by Monday.

## 2024-01-27 MED FILL — Fentanyl Citrate Preservative Free (PF) Inj 100 MCG/2ML: INTRAMUSCULAR | Qty: 2 | Status: AC

## 2024-01-29 ENCOUNTER — Telehealth: Payer: Self-pay

## 2024-01-29 ENCOUNTER — Encounter: Payer: Self-pay | Admitting: Internal Medicine

## 2024-01-29 NOTE — Telephone Encounter (Signed)
 Needs a hospital follow up. Dr. Sherre   I tried to contact the patient this morning but was unable to leave a message  When the patient calls back please due to limited availability with Dr. Sherre, please schedule the appointment with Dr. Mamatha Sirivol

## 2024-02-01 DIAGNOSIS — G4733 Obstructive sleep apnea (adult) (pediatric): Secondary | ICD-10-CM | POA: Diagnosis not present

## 2024-02-01 DIAGNOSIS — E10610 Type 1 diabetes mellitus with diabetic neuropathic arthropathy: Secondary | ICD-10-CM | POA: Diagnosis not present

## 2024-02-01 DIAGNOSIS — N179 Acute kidney failure, unspecified: Secondary | ICD-10-CM | POA: Diagnosis not present

## 2024-02-01 DIAGNOSIS — I2489 Other forms of acute ischemic heart disease: Secondary | ICD-10-CM | POA: Diagnosis not present

## 2024-02-01 DIAGNOSIS — J449 Chronic obstructive pulmonary disease, unspecified: Secondary | ICD-10-CM | POA: Diagnosis not present

## 2024-02-01 DIAGNOSIS — D631 Anemia in chronic kidney disease: Secondary | ICD-10-CM | POA: Diagnosis not present

## 2024-02-01 DIAGNOSIS — K219 Gastro-esophageal reflux disease without esophagitis: Secondary | ICD-10-CM | POA: Diagnosis not present

## 2024-02-01 DIAGNOSIS — G894 Chronic pain syndrome: Secondary | ICD-10-CM | POA: Diagnosis not present

## 2024-02-01 DIAGNOSIS — I480 Paroxysmal atrial fibrillation: Secondary | ICD-10-CM | POA: Diagnosis not present

## 2024-02-01 DIAGNOSIS — E782 Mixed hyperlipidemia: Secondary | ICD-10-CM | POA: Diagnosis not present

## 2024-02-01 DIAGNOSIS — E1065 Type 1 diabetes mellitus with hyperglycemia: Secondary | ICD-10-CM | POA: Diagnosis not present

## 2024-02-01 DIAGNOSIS — M1A09X0 Idiopathic chronic gout, multiple sites, without tophus (tophi): Secondary | ICD-10-CM | POA: Diagnosis not present

## 2024-02-01 DIAGNOSIS — N184 Chronic kidney disease, stage 4 (severe): Secondary | ICD-10-CM | POA: Diagnosis not present

## 2024-02-01 DIAGNOSIS — E1022 Type 1 diabetes mellitus with diabetic chronic kidney disease: Secondary | ICD-10-CM | POA: Diagnosis not present

## 2024-02-01 DIAGNOSIS — F411 Generalized anxiety disorder: Secondary | ICD-10-CM | POA: Diagnosis not present

## 2024-02-01 DIAGNOSIS — I25119 Atherosclerotic heart disease of native coronary artery with unspecified angina pectoris: Secondary | ICD-10-CM | POA: Diagnosis not present

## 2024-02-01 DIAGNOSIS — I110 Hypertensive heart disease with heart failure: Secondary | ICD-10-CM | POA: Diagnosis not present

## 2024-02-01 DIAGNOSIS — I252 Old myocardial infarction: Secondary | ICD-10-CM | POA: Diagnosis not present

## 2024-02-01 DIAGNOSIS — I5043 Acute on chronic combined systolic (congestive) and diastolic (congestive) heart failure: Secondary | ICD-10-CM | POA: Diagnosis not present

## 2024-02-01 DIAGNOSIS — I088 Other rheumatic multiple valve diseases: Secondary | ICD-10-CM | POA: Diagnosis not present

## 2024-02-01 DIAGNOSIS — E1042 Type 1 diabetes mellitus with diabetic polyneuropathy: Secondary | ICD-10-CM | POA: Diagnosis not present

## 2024-02-01 DIAGNOSIS — J9611 Chronic respiratory failure with hypoxia: Secondary | ICD-10-CM | POA: Diagnosis not present

## 2024-02-01 DIAGNOSIS — E038 Other specified hypothyroidism: Secondary | ICD-10-CM | POA: Diagnosis not present

## 2024-02-01 DIAGNOSIS — F331 Major depressive disorder, recurrent, moderate: Secondary | ICD-10-CM | POA: Diagnosis not present

## 2024-02-03 ENCOUNTER — Encounter: Payer: Self-pay | Admitting: Family

## 2024-02-03 ENCOUNTER — Telehealth: Payer: Self-pay

## 2024-02-03 NOTE — Telephone Encounter (Signed)
 Approval for order below given verbally on vm at number listed below for [facility].  Copied from CRM [id number]. Topic: Clinical - Home Health Verbal Orders >> Feb 03, 2024  8:07 AM Montie POUR wrote: Caller/Agency: Oneil Biles with [facility] Callback Number: [phone] He has a secured voicemail Service Requested: Physical Therapy Frequency: 2 X for 3 weeks and 1X for 4 weeks Any new concerns about the patient? No

## 2024-02-04 DIAGNOSIS — F411 Generalized anxiety disorder: Secondary | ICD-10-CM | POA: Diagnosis not present

## 2024-02-06 DIAGNOSIS — E1022 Type 1 diabetes mellitus with diabetic chronic kidney disease: Secondary | ICD-10-CM | POA: Diagnosis not present

## 2024-02-06 DIAGNOSIS — I5032 Chronic diastolic (congestive) heart failure: Secondary | ICD-10-CM | POA: Diagnosis not present

## 2024-02-06 DIAGNOSIS — I2510 Atherosclerotic heart disease of native coronary artery without angina pectoris: Secondary | ICD-10-CM | POA: Diagnosis not present

## 2024-02-06 DIAGNOSIS — N1832 Chronic kidney disease, stage 3b: Secondary | ICD-10-CM | POA: Diagnosis not present

## 2024-02-06 DIAGNOSIS — Z89512 Acquired absence of left leg below knee: Secondary | ICD-10-CM | POA: Diagnosis not present

## 2024-02-06 LAB — LAB REPORT - SCANNED: EGFR: 24

## 2024-02-07 DIAGNOSIS — Z89512 Acquired absence of left leg below knee: Secondary | ICD-10-CM | POA: Diagnosis not present

## 2024-02-07 DIAGNOSIS — S82853A Displaced trimalleolar fracture of unspecified lower leg, initial encounter for closed fracture: Secondary | ICD-10-CM | POA: Diagnosis not present

## 2024-02-08 ENCOUNTER — Other Ambulatory Visit (HOSPITAL_COMMUNITY): Payer: Self-pay

## 2024-02-08 DIAGNOSIS — S82853A Displaced trimalleolar fracture of unspecified lower leg, initial encounter for closed fracture: Secondary | ICD-10-CM | POA: Diagnosis not present

## 2024-02-08 DIAGNOSIS — Z89512 Acquired absence of left leg below knee: Secondary | ICD-10-CM | POA: Diagnosis not present

## 2024-02-10 ENCOUNTER — Encounter: Payer: Self-pay | Admitting: Family Medicine

## 2024-02-10 ENCOUNTER — Other Ambulatory Visit: Payer: Self-pay

## 2024-02-10 ENCOUNTER — Ambulatory Visit (INDEPENDENT_AMBULATORY_CARE_PROVIDER_SITE_OTHER): Admitting: Family Medicine

## 2024-02-10 ENCOUNTER — Other Ambulatory Visit (HOSPITAL_COMMUNITY): Payer: Self-pay

## 2024-02-10 VITALS — BP 104/60 | HR 74 | Temp 98.4°F | Ht 65.0 in | Wt 206.0 lb

## 2024-02-10 DIAGNOSIS — I5032 Chronic diastolic (congestive) heart failure: Secondary | ICD-10-CM | POA: Diagnosis not present

## 2024-02-10 DIAGNOSIS — I130 Hypertensive heart and chronic kidney disease with heart failure and stage 1 through stage 4 chronic kidney disease, or unspecified chronic kidney disease: Secondary | ICD-10-CM

## 2024-02-10 DIAGNOSIS — E559 Vitamin D deficiency, unspecified: Secondary | ICD-10-CM | POA: Diagnosis not present

## 2024-02-10 DIAGNOSIS — F411 Generalized anxiety disorder: Secondary | ICD-10-CM

## 2024-02-10 DIAGNOSIS — G9341 Metabolic encephalopathy: Secondary | ICD-10-CM

## 2024-02-10 DIAGNOSIS — I25118 Atherosclerotic heart disease of native coronary artery with other forms of angina pectoris: Secondary | ICD-10-CM | POA: Diagnosis not present

## 2024-02-10 DIAGNOSIS — D631 Anemia in chronic kidney disease: Secondary | ICD-10-CM | POA: Diagnosis not present

## 2024-02-10 DIAGNOSIS — Z89512 Acquired absence of left leg below knee: Secondary | ICD-10-CM | POA: Diagnosis not present

## 2024-02-10 DIAGNOSIS — N184 Chronic kidney disease, stage 4 (severe): Secondary | ICD-10-CM | POA: Diagnosis not present

## 2024-02-10 DIAGNOSIS — E1022 Type 1 diabetes mellitus with diabetic chronic kidney disease: Secondary | ICD-10-CM

## 2024-02-10 DIAGNOSIS — G4733 Obstructive sleep apnea (adult) (pediatric): Secondary | ICD-10-CM | POA: Diagnosis not present

## 2024-02-10 MED ORDER — TORSEMIDE 20 MG PO TABS
20.0000 mg | ORAL_TABLET | Freq: Every day | ORAL | 0 refills | Status: DC
  Filled 2024-02-10 (×2): qty 30, 30d supply, fill #0

## 2024-02-10 NOTE — Progress Notes (Unsigned)
 Subjective:  Patient ID: Dana [name], female    DOB: 06/18/1961  Age: 63 y.o. MRN: [id number]  Chief Complaint  Patient presents with   Medical Management of Chronic Issues    HPI:  Patient presents today for hospital follow up.  Discussed the use of AI scribe software for clinical note transcription with the patient, who gave verbal consent to proceed.  History of Present Illness  ETHLYN [name] is a 63 year old female with uncontrolled type 1 diabetes, stage 4 chronic kidney disease, and coronary artery disease who presents with recent hospital admissions for acute metabolic encephalopathy and congestive heart failure.  Acute metabolic encephalopathy and altered mental status - Multiple hospital admissions over the past several months, most recently from July 7th to July 12th - During recent admission, experienced confusion and crying, as observed by family and staff - Transferred from [facility] to [facility], where she was reportedly unresponsive for several days - Received antibiotics, morphine , and diuretics during hospitalization - CT scan of the brain performed and cultures obtained  Hyperglycemia and insulin  management - Uncontrolled type 1 diabetes with blood glucose frequently in the 200s - During hospitalization, blood sugars remained elevated unless she did not eat - Currently on Lantus  25 units daily and Novolog  on a sliding scale before meals - Adjusting insulin  doses at home - Uncertain of next endocrinology appointment  Chronic kidney disease and cardiovascular disease - Stage 4 chronic kidney disease - Coronary artery disease - On Repatha  and Zetia  for cholesterol management  Pain and neuropathy - Takes gabapentin  and hydrocodone  for pain management - Hydrocodone  used one to two times daily for back pain - Experiences phantom pains in her leg, not severe  Sleep apnea and oxygen  use - Uses CPAP machine with oxygen  at  night  Gastrointestinal symptoms - No recent need for constipation medications - Not currently on any narcotics other than hydrocodone      -MRI brain did not reveal any acute changes. - EEG was negative for seizures.  -CHF management was optimized. - Encephalopathy has resolved.  CONGESTIVE HEART FAILURE -Echocardiogram revealed preserved LV systolic function with EF 40 to 45%, global hypokinesis, interventricular septum flattened in systole and diastole, RV systolic function low normal, RV enlarged, RVSP 65,7 mmHg, LA with mild dilatation, mild to moderate mitral regurgitation (sp mitral clip), moderate TR, moderate pulmonary regurgitation.  -Volume status has improved significantly. -Continue carvedilol  and resume torsemide  today.   CORONARY ARTERY DISEASE Continue aspirin , ticagrelor  and ezetimibe    The following medications were discontinued: Keflex , fish oil, lorazepam , magnesium  oxide, potassium and probiotic.      02/10/2024   11:48 AM 10/24/2023   10:20 AM 10/04/2023   10:55 AM 07/29/2023   10:57 AM 07/12/2023   10:39 AM  Depression screen PHQ 2/9  Decreased Interest 2 1 1  0 3  Down, Depressed, Hopeless 2 2 2  0 2  PHQ - 2 Score 4 3 3  0 5  Altered sleeping 3 3 3 3 3   Tired, decreased energy 3 3 3 1 3   Change in appetite 1 3 3  0 3  Feeling bad or failure about yourself  1 2 2 2  0  Trouble concentrating 0 1 1 1  0  Moving slowly or fidgety/restless 0 0 0 0 0  Suicidal thoughts 0 0 0 0 0  PHQ-9 Score 12 15 15 7 14   Difficult doing work/chores Very difficult  Extremely dIfficult Somewhat difficult Somewhat difficult  10/24/2023   10:21 AM  Fall Risk   Falls in the past year? 0  Number falls in past yr: 0  Injury with Fall? 0  Risk for fall due to : No Fall Risks;Impaired balance/gait;Impaired mobility  Follow up Falls prevention discussed;Education provided;Falls evaluation completed    Patient Care Team: Sherre Clapper, MD as PCP - General (Family  Medicine) Bensimhon, Toribio SAUNDERS, MD as PCP - Cardiology (Cardiology) Jeneal Danita Macintosh, MD as Consulting Physician (Allergy ) Bensimhon, Toribio SAUNDERS, MD as Consulting Physician (Cardiology) Gregory Kent, MD as Referring Physician (Gastroenterology) Claudene Elsie JUDITHANN Mickey., MD as Consulting Physician (Endocrinology) Myrick Elspeth PARAS., DPM as Consulting Physician (Podiatry) Mai Lynwood FALCON, MD as Consulting Physician (Rheumatology) Fleurette Brow, MD as Referring Physician (Nephrology) Wendel Lurena POUR, MD as Consulting Physician (Cardiology) Cornelius Aileena DEL, MD as Consulting Physician (Oncology) Nathanael Deatrice Barrio, MD as Referring Physician Claudene Elsie JUDITHANN Mickey., MD (Endocrinology) Frances Ozell RAMAN, LCSW as [facility] Care Management (Licensed Clinical Social Worker) [facility] [place]   Review of Systems  Constitutional:  Negative for chills, fatigue and fever.  HENT:  Negative for congestion, ear pain, rhinorrhea and sore throat.   Respiratory:  Negative for cough and shortness of breath.   Cardiovascular:  Negative for chest pain.  Gastrointestinal:  Negative for abdominal pain, constipation, diarrhea, nausea and vomiting.  Genitourinary:  Negative for dysuria and urgency.  Musculoskeletal:  Negative for back pain and myalgias.  Neurological:  Negative for dizziness, weakness, light-headedness and headaches.  Psychiatric/Behavioral:  Negative for dysphoric mood. The patient is not nervous/anxious.     Current Outpatient Medications on File Prior to Visit  Medication Sig Dispense Refill   acetaminophen  (TYLENOL ) 325 MG tablet Take 2 tablets (650 mg total) by mouth every 6 (six) hours as needed for mild pain (pain score 1-3) or fever (or Fever >/= 101).     allopurinol  (ZYLOPRIM ) 100 MG tablet Take 0.5 tablets (50 mg total) by mouth daily. 30 tablet 0   aspirin  EC 81 MG tablet Take 1 tablet (81 mg total) by mouth daily.  Swallow whole. 30 tablet 12   busPIRone  (BUSPAR ) 15 MG tablet Take 1 tablet (15 mg total) by mouth 3 (three) times daily. 90 tablet 0   carvedilol  (COREG ) 6.25 MG tablet Take 6.25 mg by mouth 2 (two) times daily with a meal.     diclofenac  Sodium (VOLTAREN ) 1 % GEL Apply 2 g topically 4 (four) times daily. 100 g 0   Evolocumab  (REPATHA  SURECLICK) 140 MG/ML SOAJ Inject 140 mg into the skin every 14 (fourteen) days. 6 mL 1   ezetimibe  (ZETIA ) 10 MG tablet Take 1 tablet (10 mg total) by mouth daily. 30 tablet 0   gabapentin  (NEURONTIN ) 300 MG capsule Take 1 capsule (300 mg total) by mouth 2 (two) times daily. 60 capsule 0   Glucagon  (GVOKE HYPOPEN  2-PACK) 0.5 MG/0.1ML SOAJ Inject 0.5 mg into the skin daily as needed. 0.2 mL 5   HYDROCODONE -ACETAMINOPHEN  PO Take by mouth.     insulin  aspart (NOVOLOG  FLEXPEN) 100 UNIT/ML FlexPen Inject 2 - 10 Units before meals based on SSI. 15 mL 3   insulin  glargine (LANTUS ) 100 UNIT/ML Solostar Pen Inject 5 Units into the skin daily. Expires 28 days after first use 15 mL 11   Insulin  Pen Needle (BD PEN NEEDLE NANO 2ND GEN) 32G X 4 MM MISC Inject 1 each into the skin in the morning, at noon, in the evening, and  at bedtime. 600 each 3   Insulin  Pen Needle 32G X 4 MM MISC Use with Lantus  pen 100 each 0   latanoprost  (XALATAN ) 0.005 % ophthalmic solution Place 1 drop into both eyes at bedtime. 2.5 mL 0   lidocaine  4 % Place 1 patch onto the skin daily. Remove & Discard patch within 12 hours or as directed by MD 12 patch 0   linaclotide  (LINZESS ) 145 MCG CAPS capsule Take 1 capsule (145 mcg total) by mouth daily before breakfast. 30 capsule 0   Menthol -Methyl Salicylate  (ICY HOT EXTRA STRENGTH) 10-30 % CREA Apply 1 Application topically as needed (bilateral hips and knees). 85 g 1   naloxone  (NARCAN ) nasal spray 4 mg/0.1 mL Use 1 spray into one nostril for opioid overdose. May repeat in 2-3 minutes as needed in the other nostril 6 each 1   nitroGLYCERIN  (NITROSTAT ) 0.4 MG  SL tablet DISSOLVE ONE TABLET UNDER THE TONGUE EVERY 5 MINUTES AS NEEDED FOR CHEST PAIN.  DO NOT EXCEED A TOTAL OF 3 DOSES IN 15 MINUTES 50 tablet 3   nutrition supplement, JUVEN, (JUVEN) PACK Take 1 packet by mouth 2 (two) times daily between meals. 30 packet 2   OXYGEN  Inhale 2 L/min into the lungs at bedtime.     pantoprazole  (PROTONIX ) 40 MG tablet Take 1 tablet (40 mg total) by mouth daily. 30 tablet 0   polyethylene glycol powder (GLYCOLAX /MIRALAX ) 17 GM/SCOOP powder Take 1 capful (17 g) with water by mouth daily. 238 g 0   senna-docusate (SENOKOT-S) 8.6-50 MG tablet Take 1 tablet by mouth at bedtime as needed for mild constipation. 30 tablet 0   ticagrelor  (BRILINTA ) 90 MG TABS tablet Take 1 tablet (90 mg total) by mouth 2 (two) times daily. 60 tablet 0   vitamin D3 (CHOLECALCIFEROL ) 25 MCG tablet Take 2 tablets (2,000 Units total) by mouth 2 (two) times daily. 120 tablet 2   No current facility-administered medications on file prior to visit.   Past Medical History:  Diagnosis Date   Acquired absence of left great toe (HCC)    Acquired absence of other left toe(s) (HCC)    ACS (acute coronary syndrome) (HCC)    Acute bronchitis due to COVID-19 virus 03/03/2023   Acute on chronic combined systolic and diastolic CHF (congestive heart failure) (HCC) 09/16/2023   Acute respiratory failure with hypoxia (HCC) 07/20/2023   Anemia    Anoxic brain injury (HCC)    Anxiety    CAD (coronary artery disease)    DES mLAD 04/07/15; DES dRCA & DES pPDA 08/30/16; DES mCX & PTCA OM1 09/29/20   Charcot's joint of foot in type 2 diabetes mellitus (HCC) 12/15/2023   Chronic diastolic (congestive) heart failure (HCC)    Chronic pain    Chronic systolic (congestive) heart failure (HCC)    CKD (chronic kidney disease)    Contrast dye induced nephropathy, possible 09/03/2020   COPD (chronic obstructive pulmonary disease) (HCC)    COVID-19 virus infection 03/03/2023   Demand ischemia (HCC)    Diabetes  (HCC)type 1    Diastolic CHF (HCC)    Dyspnea    Encounter for screening mammogram for malignant neoplasm of breast 09/16/2023   Family history of breast cancer 10/23/2021   Gastro-esophageal reflux disease without esophagitis    Hyperlipidemia    Hypertension    Hypertensive heart disease with heart failure (HCC)    Hypoxemia    Idiopathic gout, multiple sites    Leukocytosis 03/29/2022   Localized edema  Low back pain    Mitral regurgitation    Mitral regurgitation    Mixed hyperlipidemia    Mixed incontinence    Morbid (severe) obesity due to excess calories (HCC)    Neuromuscular disorder (HCC)    Neuropathy    Non-ST elevation (NSTEMI) myocardial infarction (HCC)    08/29/20, 09/29/20   OSA (obstructive sleep apnea) 09/03/2020   Other specified hypothyroidism    PAF (paroxysmal atrial fibrillation) (HCC)    s/p pulmonary vein isolation by cryoablation 10/04/15 Dr. Meldon in [facility]   PEA (Pulseless electrical activity) [facility])    ~ 01/13/21 at [facility] during admission DKA, volume overload, possible CAP, hypoxia s/p ACLS with ROSC ~ 10-15   Pneumonia    Primary insomnia    Rheumatoid arthritis (HCC)    Stroke (HCC)    Thyroid  disease    Type 1 diabetes mellitus (HCC)    URI with cough and congestion 08/22/2023   Vitamin D  deficiency    Past Surgical History:  Procedure Laterality Date   ABDOMINAL HYSTERECTOMY     Partial- Still has both ovaries   AMPUTATION Left 12/18/2023   Procedure: AMPUTATION BELOW KNEE;  Surgeon: Harden Jerona GAILS, MD;  Location: [facility] OR;  Service: Orthopedics;  Laterality: Left;   ANKLE FUSION Left 11/13/2023   Procedure: HINDFOOT FUSION FIXATION OF ANKLE;  Surgeon: Kendal Franky SQUIBB, MD;  Location: [facility] OR;  Service: Orthopedics;  Laterality: Left;   CARDIOVASCULAR STRESS TEST  08/13/2014   Nuclear; Normal   CARPAL TUNNEL RELEASE     CATARACT EXTRACTION, BILATERAL     CESAREAN SECTION     CHOLECYSTECTOMY     CORONARY ANGIOPLASTY WITH STENT  PLACEMENT     3 blockages/ 1 stent   CORONARY BALLOON ANGIOPLASTY N/A 07/18/2023   Procedure: CORONARY BALLOON ANGIOPLASTY;  Surgeon: Verlin Lonni BIRCH, MD;  Location: [facility] INVASIVE CV LAB;  Service: Cardiovascular;  Laterality: N/A;   CORONARY PRESSURE/FFR STUDY N/A 09/07/2022   Procedure: INTRAVASCULAR PRESSURE WIRE/FFR STUDY;  Surgeon: Swaziland, Peter M, MD;  Location: [facility] INVASIVE CV LAB;  Service: Cardiovascular;  Laterality: N/A;   CORONARY STENT INTERVENTION N/A 09/29/2020   Procedure: CORONARY STENT INTERVENTION;  Surgeon: Wonda Sharper, MD;  Location: [facility] INVASIVE CV LAB;  Service: Cardiovascular;  Laterality: N/A;  CFX   CORONARY STENT INTERVENTION N/A 09/07/2022   Procedure: CORONARY STENT INTERVENTION;  Surgeon: Swaziland, Peter M, MD;  Location: [facility] INVASIVE CV LAB;  Service: Cardiovascular;  Laterality: N/A;   CORONARY STENT INTERVENTION N/A 02/13/2023   Procedure: CORONARY STENT INTERVENTION;  Surgeon: Verlin Lonni BIRCH, MD;  Location: [facility] INVASIVE CV LAB;  Service: Cardiovascular;  Laterality: N/A;  LAD   CORONARY ULTRASOUND/IVUS N/A 02/13/2023   Procedure: Coronary Ultrasound/IVUS;  Surgeon: Verlin Lonni BIRCH, MD;  Location: [facility] INVASIVE CV LAB;  Service: Cardiovascular;  Laterality: N/A;   CORONARY/GRAFT ACUTE MI REVASCULARIZATION N/A 12/08/2022   Procedure: Coronary/Graft Acute MI Revascularization;  Surgeon: Anner Alm ORN, MD;  Location: [facility] INVASIVE CV LAB;  Service: Cardiovascular;  Laterality: N/A;   EYE SURGERY     LEFT HEART CATH AND CORONARY ANGIOGRAPHY N/A 09/29/2020   Procedure: LEFT HEART CATH AND CORONARY ANGIOGRAPHY;  Surgeon: Wonda Sharper, MD;  Location: [facility] INVASIVE CV LAB;  Service: Cardiovascular;  Laterality: N/A;   LEFT HEART CATH AND CORONARY ANGIOGRAPHY N/A 09/07/2022   Procedure: LEFT HEART CATH AND CORONARY ANGIOGRAPHY;  Surgeon: Swaziland, Peter M, MD;  Location: [facility] INVASIVE CV LAB;  Service: Cardiovascular;  Laterality: N/A;  LEFT HEART CATH  AND CORONARY ANGIOGRAPHY N/A 12/08/2022   Procedure: LEFT HEART CATH AND CORONARY ANGIOGRAPHY;  Surgeon: Anner Alm ORN, MD;  Location: [facility] INVASIVE CV LAB;  Service: Cardiovascular;  Laterality: N/A;   LEFT HEART CATH AND CORONARY ANGIOGRAPHY N/A 02/13/2023   Procedure: LEFT HEART CATH AND CORONARY ANGIOGRAPHY;  Surgeon: Verlin Lonni BIRCH, MD;  Location: [facility] INVASIVE CV LAB;  Service: Cardiovascular;  Laterality: N/A;   LEFT HEART CATH AND CORONARY ANGIOGRAPHY N/A 07/16/2023   Procedure: LEFT HEART CATH AND CORONARY ANGIOGRAPHY;  Surgeon: Verlin Lonni BIRCH, MD;  Location: [facility] INVASIVE CV LAB;  Service: Cardiovascular;  Laterality: N/A;   RIGHT HEART CATH N/A 04/27/2021   Procedure: RIGHT HEART CATH;  Surgeon: Cherrie Toribio SAUNDERS, MD;  Location: [facility] INVASIVE CV LAB;  Service: Cardiovascular;  Laterality: N/A;   RIGHT/LEFT HEART CATH AND CORONARY ANGIOGRAPHY N/A 01/07/2017   Procedure: Right/Left Heart Cath and Coronary Angiography;  Surgeon: Rolan Ezra RAMAN, MD;  Location: [facility] INVASIVE CV LAB;  Service: Cardiovascular;  Laterality: N/A;   ROTATOR CUFF REPAIR     TEE WITHOUT CARDIOVERSION N/A 04/28/2021   Procedure: TRANSESOPHAGEAL ECHOCARDIOGRAM (TEE);  Surgeon: Cherrie Toribio SAUNDERS, MD;  Location: [facility] ENDOSCOPY;  Service: Cardiovascular;  Laterality: N/A;   TEE WITHOUT CARDIOVERSION N/A 05/18/2021   Procedure: TRANSESOPHAGEAL ECHOCARDIOGRAM (TEE);  Surgeon: Thukkani, Arun K, MD;  Location: [facility] INVASIVE CV LAB;  Service: Cardiovascular;  Laterality: N/A;   TOE AMPUTATION Left    TRANSCATHETER MITRAL EDGE TO EDGE REPAIR N/A 05/18/2021   Procedure: MITRAL VALVE REPAIR;  Surgeon: Wendel Lurena POUR, MD;  Location: [facility] INVASIVE CV LAB;  Service: Cardiovascular;  Laterality: N/A;   TRANSESOPHAGEAL ECHOCARDIOGRAM (CATH LAB) N/A 12/23/2023   Procedure: TRANSESOPHAGEAL ECHOCARDIOGRAM;  Surgeon: Kate Lonni CROME, MD;  Location: [facility] INVASIVE CV LAB;  Service: Cardiovascular;  Laterality: N/A;   US   ECHOCARDIOGRAPHY  05/2017   Normal    Family History  Problem Relation Age of Onset   Breast cancer Mother 68       contralateral breast ca dx 87   Coronary artery disease Mother    Hypertension Mother    Hyperlipidemia Mother    Diabetes type II Mother    Lung cancer Mother 58   Diabetes Father    Hypertension Father    Mental illness Sister    Bipolar disorder Other    Depression Other    Schizophrenia Other    Cancer Other        MGF's sisters x2; unknown type   Social History   Socioeconomic History   Marital status: Married    Spouse name: [name]   Number of children: 2   Years of education: 16   Highest education level: Manufacturing engineer (e.g., MA, MS, MEng, MEd, MSW, MBA)  Occupational History   Occupation: on disability/retired early former Engineer, site  Tobacco Use   Smoking status: Former    Current packs/day: 0.00    Types: Cigarettes    Start date: 58    Quit date: 1984    Years since quitting: 41.6    Passive exposure: Past   Smokeless tobacco: Never  Vaping Use   Vaping status: Never Used  Substance and Sexual Activity   Alcohol use: Never   Drug use: Never   Sexual activity: Not on file  Other Topics Concern   Not on file  Social History Narrative   Not on file   Social Drivers of Health   Financial Resource Strain: Low Risk  (  10/24/2023)   Overall Financial Resource Strain (CARDIA)    Difficulty of Paying Living Expenses: Not hard at all  Food Insecurity: No Food Insecurity (01/21/2024)   Hunger Vital Sign    Worried About Running Out of Food in the Last Year: Never true    Ran Out of Food in the Last Year: Never true  Transportation Needs: No Transportation Needs (01/21/2024)   PRAPARE - Administrator, Civil Service (Medical): No    Lack of Transportation (Non-Medical): No  Physical Activity: Insufficiently Active (10/24/2023)   Exercise Vital Sign    Days of Exercise per Week: 3 days    Minutes of Exercise per Session: 30 min   Stress: Stress Concern Present (10/24/2023)   [employer] of Occupational Health - Occupational Stress Questionnaire    Feeling of Stress : To some extent  Social Connections: Moderately Integrated (12/14/2023)   Social Connection and Isolation Panel    Frequency of Communication with Friends and Family: More than three times a week    Frequency of Social Gatherings with Friends and Family: Three times a week    Attends Religious Services: More than 4 times per year    Active Member of Clubs or Organizations: No    Attends Banker Meetings: Never    Marital Status: Married    Objective:  BP 104/60   Pulse 74   Temp 98.4 F (36.9 C)   Ht 5' 5 (1.651 m)   Wt 206 lb (93.4 kg)   SpO2 94%   BMI 34.28 kg/m      02/10/2024   11:03 AM 01/25/2024    9:09 AM 01/25/2024    5:00 AM  BP/Weight  Systolic BP 104 128   Diastolic BP 60 60   Wt. (Lbs) 206  205.03  BMI 34.28 kg/m2  34.12 kg/m2    Physical Exam Vitals reviewed.  Constitutional:      Appearance: Normal appearance. She is normal weight.  Neck:     Vascular: No carotid bruit.  Cardiovascular:     Rate and Rhythm: Normal rate and regular rhythm.     Heart sounds: Normal heart sounds.  Pulmonary:     Effort: Pulmonary effort is normal. No respiratory distress.     Breath sounds: Normal breath sounds.  Abdominal:     General: Abdomen is flat. Bowel sounds are normal.     Palpations: Abdomen is soft.     Tenderness: There is no abdominal tenderness.  Neurological:     Mental Status: She is alert and oriented to person, place, and time.  Psychiatric:        Mood and Affect: Mood normal.        Behavior: Behavior normal.     {Perform Simple Foot Exam  Perform Detailed exam:1} {Insert foot Exam (Optional):30965}   Lab Results  Component Value Date   WBC 8.2 02/10/2024   HGB 7.6 (L) 02/10/2024   HCT 26.2 (L) 02/10/2024   PLT 202 02/10/2024   GLUCOSE 321 (H) 02/10/2024   CHOL 124 02/10/2024    TRIG 76 02/10/2024   HDL 50 02/10/2024   LDLCALC 59 02/10/2024   ALT 10 02/10/2024   AST 7 02/10/2024   NA 136 02/10/2024   K 4.6 02/10/2024   CL 99 02/10/2024   CREATININE 1.84 (H) 02/10/2024   BUN 83 (HH) 02/10/2024   CO2 21 02/10/2024   TSH 6.320 (H) 02/10/2024   INR 1.1 05/16/2021   HGBA1C 10.2 (H)  11/12/2023      Assessment & Plan:  Acute metabolic encephalopathy Assessment & Plan: Condition improved with supportive care and social interaction. Discontinuation of buspirone  and Ativan  may have contributed to mood changes. - Encourage social interaction and support.   Status post below-knee amputation of left lower extremity (HCC) Assessment & Plan: Experiencing phantom pains, managed with hydrocodone  as needed. - Continue hydrocodone  1-2 times daily as needed for pain. - Evaluate for prosthetic fitting on Thursday.   Chronic heart failure with preserved ejection fraction (HFpEF) (HCC) Assessment & Plan: Recent exacerbation managed with diuretics. Improvement noted with diuresis. - Continue diuretics as prescribed.  Orders: -     Torsemide ; Take 1 tablet (20 mg total) by mouth daily.  Dispense: 30 tablet; Refill: 0 -     TSH  GAD (generalized anxiety disorder) Assessment & Plan: Lorazepam  discontinued during hospitalization. Taken twice since discharge for severe anxiety. - Continue buspirone . - Evaluate need for lorazepam  with caution.   CKD stage 4 due to type 1 diabetes mellitus (HCC) Assessment & Plan: Blood glucose levels elevated, consistently in the 200s. Hospitalization may have disrupted management. Increased Lantus  to 25 units due to hyperglycemia. - Increase Lantus  to 30 units. - Coordinate with endocrinologist Dr. Claudene for further management. - Consider blood work to assess current status.   Coronary artery disease of native artery of native heart with stable angina pectoris [facility]) Assessment & Plan: Managed with Repatha  and  Zetia .  Orders: -     Lipid panel  Hypertensive heart and renal disease with congestive heart failure (HCC) Assessment & Plan: Management per specialist.  Continue Carvedilol  12.5 mg two times daily, Imdur  60 mg daily, Torsemide  80 mg daily, Potassium 20 meq once daily, Brilanta 90 mg TWICE DAILY. See's Dr. Dellis at CONGESTIVE HEART FAILURE clinic. Weighs daily.  Labs drawn today.  Orders: -     CBC with Differential/Platelet -     Comprehensive metabolic panel with GFR  OSA on CPAP Assessment & Plan: - Continue CPAP at night   Vitamin D  insufficiency Assessment & Plan: Check labs  Orders: -     VITAMIN D  25 Hydroxy (Vit-D Deficiency, Fractures)     Meds ordered this encounter  Medications   torsemide  (DEMADEX ) 20 MG tablet    Sig: Take 1 tablet (20 mg total) by mouth daily.    Dispense:  30 tablet    Refill:  0    Orders Placed This Encounter  Procedures   CBC with Differential/Platelet   Comprehensive metabolic panel with GFR   Lipid panel   TSH   VITAMIN D  25 Hydroxy (Vit-D Deficiency, Fractures)    Follow-up: Return in about 3 months (around 05/12/2024) for chronic follow up.   I,Katherina A Bramblett,acting as a scribe for Abigail Free, MD.,have documented all relevant documentation on the behalf of Abigail Free, MD,as directed by  Abigail Free, MD while in the presence of Abigail Free, MD.   LILLETTE Kato I Leal-Borjas,acting as a scribe for Abigail Free, MD.,have documented all relevant documentation on the behalf of Abigail Free, MD,as directed by  Abigail Free, MD while in the presence of Abigail Free, MD.    An After Visit Summary was printed and given to the patient.  I attest that I have reviewed this visit and agree with the plan scribed by my staff.   Abigail Free, MD [facility] [phone]

## 2024-02-11 ENCOUNTER — Other Ambulatory Visit (HOSPITAL_COMMUNITY): Payer: Self-pay

## 2024-02-11 ENCOUNTER — Telehealth: Payer: Self-pay

## 2024-02-11 ENCOUNTER — Ambulatory Visit: Payer: Self-pay | Admitting: Family Medicine

## 2024-02-11 DIAGNOSIS — I5032 Chronic diastolic (congestive) heart failure: Secondary | ICD-10-CM | POA: Insufficient documentation

## 2024-02-11 DIAGNOSIS — E559 Vitamin D deficiency, unspecified: Secondary | ICD-10-CM

## 2024-02-11 LAB — CBC WITH DIFFERENTIAL/PLATELET
Basophils Absolute: 0 x10E3/uL (ref 0.0–0.2)
Basos: 0 %
EOS (ABSOLUTE): 0.2 x10E3/uL (ref 0.0–0.4)
Eos: 2 %
Hematocrit: 26.2 % — ABNORMAL LOW (ref 34.0–46.6)
Hemoglobin: 7.6 g/dL — ABNORMAL LOW (ref 11.1–15.9)
Immature Grans (Abs): 0 x10E3/uL (ref 0.0–0.1)
Immature Granulocytes: 0 %
Lymphocytes Absolute: 1 x10E3/uL (ref 0.7–3.1)
Lymphs: 13 %
MCH: 27.1 pg (ref 26.6–33.0)
MCHC: 29 g/dL — ABNORMAL LOW (ref 31.5–35.7)
MCV: 94 fL (ref 79–97)
Monocytes Absolute: 0.5 x10E3/uL (ref 0.1–0.9)
Monocytes: 7 %
Neutrophils Absolute: 6.4 x10E3/uL (ref 1.4–7.0)
Neutrophils: 78 %
Platelets: 202 x10E3/uL (ref 150–450)
RBC: 2.8 x10E6/uL — ABNORMAL LOW (ref 3.77–5.28)
RDW: 16.4 % — ABNORMAL HIGH (ref 11.7–15.4)
WBC: 8.2 x10E3/uL (ref 3.4–10.8)

## 2024-02-11 LAB — COMPREHENSIVE METABOLIC PANEL WITH GFR
ALT: 10 IU/L (ref 0–32)
AST: 7 IU/L (ref 0–40)
Albumin: 3.4 g/dL — ABNORMAL LOW (ref 3.9–4.9)
Alkaline Phosphatase: 118 IU/L (ref 44–121)
BUN/Creatinine Ratio: 45 — ABNORMAL HIGH (ref 12–28)
BUN: 83 mg/dL (ref 8–27)
Bilirubin Total: 0.3 mg/dL (ref 0.0–1.2)
CO2: 21 mmol/L (ref 20–29)
Calcium: 8.4 mg/dL — ABNORMAL LOW (ref 8.7–10.3)
Chloride: 99 mmol/L (ref 96–106)
Creatinine, Ser: 1.84 mg/dL — ABNORMAL HIGH (ref 0.57–1.00)
Globulin, Total: 2.9 g/dL (ref 1.5–4.5)
Glucose: 321 mg/dL — ABNORMAL HIGH (ref 70–99)
Potassium: 4.6 mmol/L (ref 3.5–5.2)
Sodium: 136 mmol/L (ref 134–144)
Total Protein: 6.3 g/dL (ref 6.0–8.5)
eGFR: 31 mL/min/1.73 — ABNORMAL LOW (ref >59)

## 2024-02-11 LAB — LIPID PANEL
Chol/HDL Ratio: 2.5 ratio (ref 0.0–4.4)
Cholesterol, Total: 124 mg/dL (ref 100–199)
HDL: 50 mg/dL (ref >39)
LDL Chol Calc (NIH): 59 mg/dL (ref 0–99)
Triglycerides: 76 mg/dL (ref 0–149)
VLDL Cholesterol Cal: 15 mg/dL (ref 5–40)

## 2024-02-11 LAB — TSH: TSH: 6.32 u[IU]/mL — ABNORMAL HIGH (ref 0.450–4.500)

## 2024-02-11 LAB — VITAMIN D 25 HYDROXY (VIT D DEFICIENCY, FRACTURES): Vit D, 25-Hydroxy: 15.7 ng/mL — ABNORMAL LOW (ref 30.0–100.0)

## 2024-02-11 NOTE — Assessment & Plan Note (Signed)
 Check labs

## 2024-02-11 NOTE — Progress Notes (Incomplete)
 ADVANCED HF CLINIC NOTE   PCP: Sherre Clapper, MD HF Cardiologist: Toribio Fuel, MD   Chief Complaint: Heart Failure Follow-up  HPI: Dana [name] is 63 y.o. female with history of diastolic heart failure s/p cardiomems 02/2018, CAD, s/p DES RCA and LAD 2018, DMI, HTN, HLD, PAD, and OSA on CPAP, PAF s/p ablation, PAD s/p L great toe and 4th toe amputation, stage IIIb CKD, PEA arrest in 12/2020, severe MR s/p mTEER in 2022.   LHC 3/22 showed moderate mid-LAD and mid-RCA disease, severe mid-L-Cx stenosis s/p PCI/DES mid Cx and side branch angioplasty of OM1.  10/22 she was enrolled in FASTR trial and diuresed > 15 lb. RHC 10/22 showed mild to moderate elevating filling pressures with large v-waves in PCWP tracing suggesting severe MR vs severe diastolic dysfunction. TEE 55-60% showed severe central MR she underwent TEER of mitral valve with placement of 1XT W and 1NT W clip on 05/2021. NT W clip was entangled and had to be deployed into lateral part of valve. No additional clip placement could be preformed. Echo 11/04 with EF 50-55% and residual moderate MR.   Multiple admissions over the last year related to ACS/CP/NSTEMI requiring LHC and PCI (see cardiac studies below). 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 Has narrowly missed HD during multiple admissions:   Today she returns for HF follow up with her friend. Doing well. She had a grandson 3 weeks ago. No recent CP. Doing well with with fluid control. Cardiomems 10-14. Seeing Dr. Prescilla for Renal. Saw her last week SCr back down to 2.2. Doing ADls. Taking care of her elderly mom as well as grandson and animals. No edema, orthopnea or PND. Taking torsemide  60 daily with extra as needed. Also takes Furoscix  for breakthrough.    Cardiac Studies: - Echo 12/24: EF 55-60%, RV normal, MVg unchanged at 5 mmHg - LHC (1/25): PTCA to prox LAD - LHC (12/24): multivessel CAD. Proximal LAD restenosis - LHC (8/24):  PTCA/DES x 1  proximal LAD, chronic occlusion mid Circ beyond the stented segment, patent obtuse marginal branch with severe distal stenosis (too small for PCI), dominant RCA with moderate proximal stenosis, patent distal stent, LVEDP 19 mmhg. - Echo (5/24): EF 50-55%. RV mildly reduced, post mitral clip x 2 with XTW and NTW the latter entangled in chords, no obvious SLD noted, mean gradient 5 mmHg, mild residual MR appears medial to the two clips. - LHC (5/24): DES to LAD, osial prox RCA with severe 85% stenosis-->will need staged intervention when renal function stabilized. - LHC (2/24): 2v obstructive CAD. New 90% stenosis pLAD with RFR 0.48. The stent in the LCx is occluded after a large first OM. Patent stent in the distal RCA. S/p PCI of pLAD with DES x 1 overlapping with prior stent. - Echo (1/24): EF 55-60%, normal RV and moderate MR, mG 7.5 mmHg, essentially unchanged from prior echo.   Review of systems complete and found to be negative unless listed in HPI.    Past Medical History:  Diagnosis Date   Acquired absence of left great toe (HCC)    Acquired absence of other left toe(s) (HCC)    ACS (acute coronary syndrome) (HCC)    Acute bronchitis due to COVID-19 virus 03/03/2023   Acute on chronic combined systolic and diastolic CHF (congestive heart failure) (HCC) 09/16/2023   Acute respiratory failure with hypoxia (HCC) 07/20/2023   Anemia    Anoxic brain injury (HCC)    Anxiety  CAD (coronary artery disease)    DES mLAD 04/07/15; DES dRCA & DES pPDA 08/30/16; DES mCX & PTCA OM1 09/29/20   Charcot's joint of foot in type 2 diabetes mellitus (HCC) 12/15/2023   Chronic diastolic (congestive) heart failure (HCC)    Chronic pain    Chronic systolic (congestive) heart failure (HCC)    CKD (chronic kidney disease)    Contrast dye induced nephropathy, possible 09/03/2020   COPD (chronic obstructive pulmonary disease) (HCC)    COVID-19 virus infection 03/03/2023   Demand ischemia (HCC)    Diabetes  (HCC)type 1    Diastolic CHF (HCC)    Dyspnea    Encounter for screening mammogram for malignant neoplasm of breast 09/16/2023   Family history of breast cancer 10/23/2021   Gastro-esophageal reflux disease without esophagitis    Hyperlipidemia    Hypertension    Hypertensive heart disease with heart failure (HCC)    Hypoxemia    Idiopathic gout, multiple sites    Leukocytosis 03/29/2022   Localized edema    Low back pain    Mitral regurgitation    Mitral regurgitation    Mixed hyperlipidemia    Mixed incontinence    Morbid (severe) obesity due to excess calories (HCC)    Neuromuscular disorder (HCC)    Neuropathy    Non-ST elevation (NSTEMI) myocardial infarction (HCC)    08/29/20, 09/29/20   OSA (obstructive sleep apnea) 09/03/2020   Other specified hypothyroidism    PAF (paroxysmal atrial fibrillation) (HCC)    s/p pulmonary vein isolation by cryoablation 10/04/15 Dr. Meldon in [facility]   PEA (Pulseless electrical activity) [facility])    ~ 01/13/21 at [facility] during admission DKA, volume overload, possible CAP, hypoxia s/p ACLS with ROSC ~ 10-15   Pneumonia    Primary insomnia    Rheumatoid arthritis (HCC)    Stroke (HCC)    Thyroid  disease    Type 1 diabetes mellitus (HCC)    URI with cough and congestion 08/22/2023   Vitamin D  deficiency    Current Outpatient Medications  Medication Sig Dispense Refill   acetaminophen  (TYLENOL ) 325 MG tablet Take 2 tablets (650 mg total) by mouth every 6 (six) hours as needed for mild pain (pain score 1-3) or fever (or Fever >/= 101).     allopurinol  (ZYLOPRIM ) 100 MG tablet Take 0.5 tablets (50 mg total) by mouth daily. 30 tablet 0   aspirin  EC 81 MG tablet Take 1 tablet (81 mg total) by mouth daily. Swallow whole. 30 tablet 12   busPIRone  (BUSPAR ) 15 MG tablet Take 1 tablet (15 mg total) by mouth 3 (three) times daily. 90 tablet 0   carvedilol  (COREG ) 6.25 MG tablet Take 6.25 mg by mouth 2 (two) times daily with a meal.      diclofenac  Sodium (VOLTAREN ) 1 % GEL Apply 2 g topically 4 (four) times daily. 100 g 0   Evolocumab  (REPATHA  SURECLICK) 140 MG/ML SOAJ Inject 140 mg into the skin every 14 (fourteen) days. 6 mL 1   ezetimibe  (ZETIA ) 10 MG tablet Take 1 tablet (10 mg total) by mouth daily. 30 tablet 0   gabapentin  (NEURONTIN ) 300 MG capsule Take 1 capsule (300 mg total) by mouth 2 (two) times daily. 60 capsule 0   Glucagon  (GVOKE HYPOPEN  2-PACK) 0.5 MG/0.1ML SOAJ Inject 0.5 mg into the skin daily as needed. 0.2 mL 5   HYDROCODONE -ACETAMINOPHEN  PO Take by mouth.     insulin  aspart (NOVOLOG  FLEXPEN) 100 UNIT/ML FlexPen Inject 2 -  10 Units before meals based on SSI. 15 mL 3   insulin  glargine (LANTUS ) 100 UNIT/ML Solostar Pen Inject 5 Units into the skin daily. Expires 28 days after first use 15 mL 11   Insulin  Pen Needle (BD PEN NEEDLE NANO 2ND GEN) 32G X 4 MM MISC Inject 1 each into the skin in the morning, at noon, in the evening, and at bedtime. 600 each 3   Insulin  Pen Needle 32G X 4 MM MISC Use with Lantus  pen 100 each 0   latanoprost  (XALATAN ) 0.005 % ophthalmic solution Place 1 drop into both eyes at bedtime. 2.5 mL 0   lidocaine  4 % Place 1 patch onto the skin daily. Remove & Discard patch within 12 hours or as directed by MD 12 patch 0   linaclotide  (LINZESS ) 145 MCG CAPS capsule Take 1 capsule (145 mcg total) by mouth daily before breakfast. 30 capsule 0   Menthol -Methyl Salicylate  (ICY HOT EXTRA STRENGTH) 10-30 % CREA Apply 1 Application topically as needed (bilateral hips and knees). 85 g 1   naloxone  (NARCAN ) nasal spray 4 mg/0.1 mL Use 1 spray into one nostril for opioid overdose. May repeat in 2-3 minutes as needed in the other nostril 6 each 1   nitroGLYCERIN  (NITROSTAT ) 0.4 MG SL tablet DISSOLVE ONE TABLET UNDER THE TONGUE EVERY 5 MINUTES AS NEEDED FOR CHEST PAIN.  DO NOT EXCEED A TOTAL OF 3 DOSES IN 15 MINUTES 50 tablet 3   nutrition supplement, JUVEN, (JUVEN) PACK Take 1 packet by mouth 2 (two) times  daily between meals. 30 packet 2   OXYGEN  Inhale 2 L/min into the lungs at bedtime.     pantoprazole  (PROTONIX ) 40 MG tablet Take 1 tablet (40 mg total) by mouth daily. 30 tablet 0   polyethylene glycol powder (GLYCOLAX /MIRALAX ) 17 GM/SCOOP powder Take 1 capful (17 g) with water by mouth daily. 238 g 0   senna-docusate (SENOKOT-S) 8.6-50 MG tablet Take 1 tablet by mouth at bedtime as needed for mild constipation. 30 tablet 0   ticagrelor  (BRILINTA ) 90 MG TABS tablet Take 1 tablet (90 mg total) by mouth 2 (two) times daily. 60 tablet 0   torsemide  (DEMADEX ) 20 MG tablet Take 1 tablet (20 mg total) by mouth daily. 30 tablet 0   vitamin D3 (CHOLECALCIFEROL ) 25 MCG tablet Take 2 tablets (2,000 Units total) by mouth 2 (two) times daily. 120 tablet 2   No current facility-administered medications for this visit.   Allergies  Allergen Reactions   Naproxen Hives and Rash   Shellfish-Derived Products Hives, Swelling and Other (See Comments)    Angioedema    Strawberry (Diagnostic) Hives   Celecoxib Other (See Comments)    Stomach pain   Doxepin  Hcl Other (See Comments)    Lethargic, sleeping a lot per pt and family   Glucosamine Hives, Swelling and Other (See Comments)    Angioedema    Iron  Hives and Other (See Comments)    IV iron  transfusion - hives - Feb 2022 hospitalization  Patient said she CAN tolerate if given Benadryl  first   Metoclopramide Other (See Comments)    Facial twitching and stuttering   Metolazone  Other (See Comments)    Acute renal failure   Statins Other (See Comments)    Muscle pain   Social History   Socioeconomic History   Marital status: Married    Spouse name: [name]   Number of children: 2   Years of education: 16   Highest education level: Master's degree (  e.g., MA, Dana, MEng, MEd, MSW, MBA)  Occupational History   Occupation: on disability/retired early former Engineer, site  Tobacco Use   Smoking status: Former    Current packs/day: 0.00    Types:  Cigarettes    Start date: 58    Quit date: 1984    Years since quitting: 41.6    Passive exposure: Past   Smokeless tobacco: Never  Vaping Use   Vaping status: Never Used  Substance and Sexual Activity   Alcohol use: Never   Drug use: Never   Sexual activity: Not on file  Other Topics Concern   Not on file  Social History Narrative   Not on file   Social Drivers of Health   Financial Resource Strain: Low Risk  (10/24/2023)   Overall Financial Resource Strain (CARDIA)    Difficulty of Paying Living Expenses: Not hard at all  Food Insecurity: No Food Insecurity (01/21/2024)   Hunger Vital Sign    Worried About Running Out of Food in the Last Year: Never true    Ran Out of Food in the Last Year: Never true  Transportation Needs: No Transportation Needs (01/21/2024)   PRAPARE - Administrator, Civil Service (Medical): No    Lack of Transportation (Non-Medical): No  Physical Activity: Insufficiently Active (10/24/2023)   Exercise Vital Sign    Days of Exercise per Week: 3 days    Minutes of Exercise per Session: 30 min  Stress: Stress Concern Present (10/24/2023)   [employer] of Occupational Health - Occupational Stress Questionnaire    Feeling of Stress : To some extent  Social Connections: Moderately Integrated (12/14/2023)   Social Connection and Isolation Panel    Frequency of Communication with Friends and Family: More than three times a week    Frequency of Social Gatherings with Friends and Family: Three times a week    Attends Religious Services: More than 4 times per year    Active Member of Clubs or Organizations: No    Attends Banker Meetings: Never    Marital Status: Married  Catering manager Violence: Not At Risk (01/23/2024)   Humiliation, Afraid, Rape, and Kick questionnaire    Fear of Current or Ex-Partner: No    Emotionally Abused: No    Physically Abused: No    Sexually Abused: No   Family History  Problem Relation Age of  Onset   Breast cancer Mother 60       contralateral breast ca dx 53   Coronary artery disease Mother    Hypertension Mother    Hyperlipidemia Mother    Diabetes type II Mother    Lung cancer Mother 29   Diabetes Father    Hypertension Father    Mental illness Sister    Bipolar disorder Other    Depression Other    Schizophrenia Other    Cancer Other        MGF's sisters x2; unknown type   There were no vitals taken for this visit.  Wt Readings from Last 3 Encounters:  02/10/24 93.4 kg (206 lb)  01/25/24 93 kg (205 lb 0.4 oz)  01/16/24 99.8 kg (220 lb)   PHYSICAL EXAM: General:  Well appearing. No resp difficulty HEENT: normal Neck: supple. no JVD. Carotids 2+ bilat; no bruits. No lymphadenopathy or thryomegaly appreciated. Cor: PMI nondisplaced. Regular rate & rhythm. No rubs, gallops or murmurs. Lungs: clear Abdomen: obese soft, nontender, nondistended. No hepatosplenomegaly. No bruits or masses. Good bowel sounds.  Extremities: no cyanosis, clubbing, rash, edema + TED Neuro: alert & orientedx3, cranial nerves grossly intact. moves all 4 extremities w/o difficulty. Affect pleasant   CardioMEMs: 10 08/02/23. Goal 10 Range 10-14 Personally reviewed   EKG SB 57 bpm (Personally reviewed)    ASSESSMENT & PLAN:  1. CAD, H/o NSTEMIs - H/o multiple PCI with stents to mid LAD and pRCA - NSTEMI (2/22). Cath deferred due to AKI. Treated medically.  - NSTEMI (7/22)-->S/p PCI/DES to mid Cx, balloon angioplasty to OM1  - LHC (2/24) for chest pain--> new 90% stenosis pLAD. Successful PCI of pLAD with DES x 1 overlapping with prior stent.  - LHC (5/24) --> DES to LAD, severe 85% ostial prox RCA will need eventual intervention but renal function precludes.  - LHC (8/24): PTCA/DES x 1 proximal LAD - LHC (12/24): with proximal LAD restenosis. S/p successful balloon angioplasty 07/18/23 - Doing well. No s/s angina  - Continue ASA + Brilinta . - Continue Coreg . - Continue Repatha  + Zetia   (allergy  statins). Recent LDL 28 12/24 - Continue Imdur   2. Chronic Diastolic Heart Failure.  - Echo (3/22): EF 55-60%, Grade II DD, normal RV - RHC (10/22): mild to moderate elevated filling pressures, large v-waves PCWP tracing - TEE (10/22): EF 55-60%, basal inferior akinesis, severe central MR.>>Underwent TEER>>mod residual MR - Echo (11/22): EF 50-55%, severe LV HK, moderate MR - Echo (12/24): EF 55-60%, LV with no RWMA, RV normal, mild MR mG 5 mmHg - Stable/improved NYHA III. Volume ok today on exam and cardiomems - Continue torsemide  60 mg daily + 20 KCL daily. - No SGLT2i with DM1 and recurrent UTIs - Recent labs reviewed Scr 2.21   3. CKD IV - Prior h/o CIN following LHC - Previous SCr baseline 1.6-1.8 with multiple AKIs over the last year. New baseline ~ 2.0-2.5 - Followed by Dr. Prescilla. Last SCr 2.2 - AKI post cath (5/24), 60 cc contrast used. SCr 2.7>4.25 - AKI post cath (8/24), 80 cc contrast used. SCr peak at 5.19  4. MR  - Severe central MR on TEE on 10/22.   - S/p TEER 11/22 - two clips placed, one became entangled and had to be deployed. - Moderate residual MR on echo 11/22 - Echo (5/24): post mitral clip x 2 with XTW and NTW the latter entangled in chords no obvious SLD noted mean gradient 5 mmHg. Mild residual MR appears medial to the two clips  - Echo 12/24: Stable moderate MR   5. OSA - Continue CPAP - No change   6. DM I - On insulin  - Recent A1c 9 10/24 - No SGLT2i with DM1 and recurrent UTIs     Harlene CHRISTELLA Gainer, FNP  4:23 PM

## 2024-02-11 NOTE — Assessment & Plan Note (Signed)
 Continue CPAP at night

## 2024-02-11 NOTE — Assessment & Plan Note (Signed)
 Experiencing phantom pains, managed with hydrocodone  as needed. - Continue hydrocodone  1-2 times daily as needed for pain. - Evaluate for prosthetic fitting on Thursday.

## 2024-02-11 NOTE — Assessment & Plan Note (Signed)
 Condition improved with supportive care and social interaction. Discontinuation of buspirone  and Ativan  may have contributed to mood changes. - Encourage social interaction and support.

## 2024-02-11 NOTE — Assessment & Plan Note (Signed)
 Lorazepam  discontinued during hospitalization. Taken twice since discharge for severe anxiety. - Continue buspirone . - Evaluate need for lorazepam  with caution.

## 2024-02-11 NOTE — Telephone Encounter (Signed)
 Order number: [id number]

## 2024-02-11 NOTE — Assessment & Plan Note (Signed)
 Managed with Repatha  and Zetia .

## 2024-02-11 NOTE — Assessment & Plan Note (Signed)
 Recent exacerbation managed with diuretics. Improvement noted with diuresis. - Continue diuretics as prescribed.

## 2024-02-11 NOTE — Assessment & Plan Note (Signed)
 Management per specialist.  Continue Carvedilol 12.5 mg two times daily, Imdur 60 mg daily, Torsemide 80 mg daily, Potassium 20 meq once daily, Brilanta 90 mg TWICE DAILY. See's Dr. Patrica Duel at [facility]. Weighs daily.  Labs drawn today.

## 2024-02-11 NOTE — Assessment & Plan Note (Signed)
 Blood glucose levels elevated, consistently in the 200s. Hospitalization may have disrupted management. Increased Lantus  to 25 units due to hyperglycemia. - Increase Lantus  to 30 units. - Coordinate with endocrinologist Dr. Claudene for further management. - Consider blood work to assess current status.

## 2024-02-12 ENCOUNTER — Other Ambulatory Visit (HOSPITAL_COMMUNITY): Payer: Self-pay

## 2024-02-12 ENCOUNTER — Telehealth (HOSPITAL_COMMUNITY): Payer: Self-pay | Admitting: Cardiology

## 2024-02-12 ENCOUNTER — Other Ambulatory Visit: Payer: Self-pay

## 2024-02-12 MED ORDER — VITAMIN D (ERGOCALCIFEROL) 1.25 MG (50000 UNIT) PO CAPS
50000.0000 [IU] | ORAL_CAPSULE | ORAL | 0 refills | Status: AC
  Filled 2024-02-12: qty 5, 35d supply, fill #0

## 2024-02-12 NOTE — Telephone Encounter (Signed)
 PCP called with abnormal labs Hg 7.6 ?transfusion at [facility] with cardiac history   Per Dr Bensimohon . If hgb stable would give 1u rbcs and 80iv Lasix .   Will need f/u with Renal to decide on need for epo.  Pt has appt with AHF clinic 8/1-will add STAT T&S AT [facility]RIVAL And contact short stay/infusion clinic for appt shortly after AHF appt   Per Philicia B No pre cert required

## 2024-02-12 NOTE — Assessment & Plan Note (Signed)
 Anemia worsened. BUN up. Concern for gi bleed, but patient reports no black or bloody Bms.  Patient to see [facility] clinic on Friday. Type and cross and give 1 U PRBCs.  If patient starts seeing blood in stool or experiencing dizziness, she should go to ED.

## 2024-02-13 ENCOUNTER — Ambulatory Visit: Admitting: Orthopedic Surgery

## 2024-02-13 ENCOUNTER — Telehealth (HOSPITAL_COMMUNITY): Payer: Self-pay

## 2024-02-13 ENCOUNTER — Other Ambulatory Visit (HOSPITAL_COMMUNITY): Payer: Self-pay | Admitting: *Deleted

## 2024-02-13 ENCOUNTER — Other Ambulatory Visit (HOSPITAL_COMMUNITY): Payer: Self-pay | Admitting: Physician Assistant

## 2024-02-13 DIAGNOSIS — D631 Anemia in chronic kidney disease: Secondary | ICD-10-CM

## 2024-02-13 NOTE — Telephone Encounter (Signed)
 Called to confirm/remind patient of their appointment at the [facility] on 02/14/24 3:00.   Appointment:   [x] Confirmed  [] Left mess   [] No answer/No voice mail  [] VM Full/unable to leave message  [] Phone not in service  Patient reminded to bring all medications and/or complete list.  Confirmed patient has transportation. Gave directions, instructed to utilize valet parking.

## 2024-02-13 NOTE — Telephone Encounter (Signed)
 Spoke with infusion clinic- patient can come tomorrow morning at 8am for blood transfusion and 80mg  IV lasix .   Per infusion clinic- they will type and cross/run labs when patient arrives.   Moved patients AHF apt to the afternoon.   Patient is aware of apt time and date and to be here at [facility] at 8am,. She is aware that afternoon apt is made for HF clinic.   Orders for blood/80mg  iv lasix  placed by Manuelita Dutch, PA-C

## 2024-02-14 ENCOUNTER — Ambulatory Visit (HOSPITAL_COMMUNITY)
Admission: RE | Admit: 2024-02-14 | Discharge: 2024-02-14 | Disposition: A | Discharge status: Home / Self Care | Source: Ambulatory Visit | Attending: Cardiology | Admitting: Cardiology

## 2024-02-14 ENCOUNTER — Encounter (HOSPITAL_COMMUNITY): Payer: Self-pay

## 2024-02-14 ENCOUNTER — Other Ambulatory Visit (HOSPITAL_BASED_OUTPATIENT_CLINIC_OR_DEPARTMENT_OTHER): Payer: Self-pay

## 2024-02-14 ENCOUNTER — Other Ambulatory Visit: Payer: Self-pay

## 2024-02-14 ENCOUNTER — Encounter (HOSPITAL_COMMUNITY)

## 2024-02-14 ENCOUNTER — Other Ambulatory Visit (HOSPITAL_COMMUNITY): Payer: Self-pay

## 2024-02-14 ENCOUNTER — Encounter (HOSPITAL_COMMUNITY)
Admission: RE | Admit: 2024-02-14 | Discharge: 2024-02-14 | Disposition: A | Discharge status: Home / Self Care | Source: Ambulatory Visit | Attending: Internal Medicine | Admitting: Internal Medicine

## 2024-02-14 VITALS — BP 142/68 | HR 75 | Temp 98.0°F | Resp 17

## 2024-02-14 VITALS — BP 118/72 | HR 70 | Ht 65.0 in | Wt 206.0 lb

## 2024-02-14 DIAGNOSIS — E785 Hyperlipidemia, unspecified: Secondary | ICD-10-CM | POA: Diagnosis not present

## 2024-02-14 DIAGNOSIS — I252 Old myocardial infarction: Secondary | ICD-10-CM | POA: Insufficient documentation

## 2024-02-14 DIAGNOSIS — Z7982 Long term (current) use of aspirin: Secondary | ICD-10-CM | POA: Insufficient documentation

## 2024-02-14 DIAGNOSIS — Z8674 Personal history of sudden cardiac arrest: Secondary | ICD-10-CM | POA: Insufficient documentation

## 2024-02-14 DIAGNOSIS — G4733 Obstructive sleep apnea (adult) (pediatric): Secondary | ICD-10-CM | POA: Diagnosis not present

## 2024-02-14 DIAGNOSIS — I480 Paroxysmal atrial fibrillation: Secondary | ICD-10-CM | POA: Insufficient documentation

## 2024-02-14 DIAGNOSIS — Z89512 Acquired absence of left leg below knee: Secondary | ICD-10-CM | POA: Insufficient documentation

## 2024-02-14 DIAGNOSIS — Z955 Presence of coronary angioplasty implant and graft: Secondary | ICD-10-CM | POA: Insufficient documentation

## 2024-02-14 DIAGNOSIS — D631 Anemia in chronic kidney disease: Secondary | ICD-10-CM | POA: Diagnosis not present

## 2024-02-14 DIAGNOSIS — I5042 Chronic combined systolic (congestive) and diastolic (congestive) heart failure: Secondary | ICD-10-CM

## 2024-02-14 DIAGNOSIS — Z8614 Personal history of Methicillin resistant Staphylococcus aureus infection: Secondary | ICD-10-CM | POA: Insufficient documentation

## 2024-02-14 DIAGNOSIS — F418 Other specified anxiety disorders: Secondary | ICD-10-CM | POA: Insufficient documentation

## 2024-02-14 DIAGNOSIS — I340 Nonrheumatic mitral (valve) insufficiency: Secondary | ICD-10-CM | POA: Diagnosis not present

## 2024-02-14 DIAGNOSIS — I2510 Atherosclerotic heart disease of native coronary artery without angina pectoris: Secondary | ICD-10-CM | POA: Insufficient documentation

## 2024-02-14 DIAGNOSIS — N184 Chronic kidney disease, stage 4 (severe): Secondary | ICD-10-CM | POA: Diagnosis not present

## 2024-02-14 DIAGNOSIS — E1040 Type 1 diabetes mellitus with diabetic neuropathy, unspecified: Secondary | ICD-10-CM | POA: Insufficient documentation

## 2024-02-14 DIAGNOSIS — I5032 Chronic diastolic (congestive) heart failure: Secondary | ICD-10-CM | POA: Diagnosis not present

## 2024-02-14 DIAGNOSIS — I130 Hypertensive heart and chronic kidney disease with heart failure and stage 1 through stage 4 chronic kidney disease, or unspecified chronic kidney disease: Secondary | ICD-10-CM | POA: Diagnosis not present

## 2024-02-14 DIAGNOSIS — E1022 Type 1 diabetes mellitus with diabetic chronic kidney disease: Secondary | ICD-10-CM | POA: Insufficient documentation

## 2024-02-14 DIAGNOSIS — Z7902 Long term (current) use of antithrombotics/antiplatelets: Secondary | ICD-10-CM | POA: Insufficient documentation

## 2024-02-14 DIAGNOSIS — D62 Acute posthemorrhagic anemia: Secondary | ICD-10-CM

## 2024-02-14 LAB — PREPARE RBC (CROSSMATCH)

## 2024-02-14 MED ORDER — TORSEMIDE 20 MG PO TABS
60.0000 mg | ORAL_TABLET | Freq: Every day | ORAL | 3 refills | Status: DC
  Filled 2024-02-14 – 2024-03-04 (×3): qty 90, 30d supply, fill #0
  Filled 2024-04-18: qty 90, 30d supply, fill #1
  Filled 2024-05-24: qty 90, 30d supply, fill #2
  Filled 2024-06-20: qty 90, 30d supply, fill #3

## 2024-02-14 MED ORDER — FUROSEMIDE 10 MG/ML IJ SOLN
INTRAMUSCULAR | Status: AC
  Filled 2024-02-14: qty 8

## 2024-02-14 MED ORDER — FUROSCIX 80 MG/10ML ~~LOC~~ CTKT
80.0000 mg | CARTRIDGE | SUBCUTANEOUS | 3 refills | Status: AC
  Filled 2024-02-14: qty 4, 4d supply, fill #0
  Filled 2024-03-23: qty 4, 4d supply, fill #1
  Filled 2024-05-24: qty 4, 4d supply, fill #2

## 2024-02-14 MED ORDER — POTASSIUM CHLORIDE CRYS ER 20 MEQ PO TBCR
20.0000 meq | EXTENDED_RELEASE_TABLET | Freq: Once | ORAL | Status: AC
  Administered 2024-02-14: 20 meq via ORAL

## 2024-02-14 MED ORDER — SODIUM CHLORIDE 0.9% IV SOLUTION: Freq: Once | INTRAVENOUS | Status: DC

## 2024-02-14 MED ORDER — FUROSEMIDE 10 MG/ML IJ SOLN
80.0000 mg | Freq: Once | INTRAMUSCULAR | Status: AC
  Administered 2024-02-14: 80 mg via INTRAVENOUS

## 2024-02-14 NOTE — Progress Notes (Signed)
 Specialty Pharmacy Initial Fill Coordination Note  Dana [name] is a 63 y.o. female contacted today regarding initial fill of specialty medication(s) Furosemide  (Furoscix )   Patient requested Delivery   Delivery date: 02/18/24   Verified address: [address]  [place] [address]   Medication will be filled on 02/17/24.   Patient is aware of $0 copayment.  Patient has samples on hand for the 2 doses she needs for 8/2 and 8/3 and then she is going back to oral diuretic for now. Per Lauren send patient the 4 prescribed kits to have on hand in case of future use. Patient aware to call specialty pharmacy directly if she needs future refills.

## 2024-02-14 NOTE — Patient Instructions (Addendum)
 Medication Changes:  Your provider has order Furoscix  for you. This is an on-body infuser that gives you a dose of Furosemide .   It will be shipped to your home from [facility], they will call you before shipping  Ensure you write down the time you start your infusion so that if there is a problem you will know how long the infusion lasted  Use Furoscix  only AS DIRECTED by our office  Dosing Directions:   Day 1= TAKE 1 DOSE OF FUROSCIX  TOMORROW 02/15/24--DO NOT TAKE TORSEMIDE    Day 2= TAKE 1 DOSE OF FUROSCIX  ON SUNDAY 02/16/24---DO NOT TAKE TORSEMIDE   Day 3= RETURN TORSEMIDE  60MG  ON MONDAY   ONE TIME DOSE OF POTASSIUM GIVEN TODAY   Lab Work:  PLEASE GO TO [facility] IN 1 WEEK FOR LAB WORK TO RECHECK POTASSIUM LEVELS   Follow-Up in: 4 WEEKS  At the [facility], you and your health needs are our priority. We have a designated team specialized in the treatment of Heart Failure. This Care Team includes your primary Heart Failure Specialized Cardiologist (physician), Advanced Practice Providers (APPs- Physician Assistants and Nurse Practitioners), and Pharmacist who all work together to provide you with the care you need, when you need it.   You may see any of the following providers on your designated Care Team at your next follow up:  Dr. Toribio Fuel Dr. Ezra Shuck Dr. Ria Commander Dr. Odis Brownie Greig Mosses, NP [place]affie Shed, [place] [facility] [place], [place] Beckey Coe, NP [place], NP Tinnie Redman, PharmD   Please be sure to bring in all your medications bottles to every appointment.   Need to Contact Us :  If you have any questions or concerns before your next appointment please send us  a message through [place] or call our office at [phone].    TO LEAVE A MESSAGE FOR THE NURSE SELECT OPTION 2, PLEASE LEAVE A MESSAGE INCLUDING: YOUR NAME DATE OF BIRTH CALL BACK NUMBER REASON FOR CALL**this is important  as we prioritize the call backs  YOU WILL RECEIVE A CALL BACK THE SAME DAY AS LONG AS YOU CALL BEFORE 4:00 PM

## 2024-02-14 NOTE — Progress Notes (Signed)
 ADVANCED HF CLINIC NOTE   PCP: Sherre Clapper, MD HF Cardiologist: Toribio Fuel, MD   Chief Complaint: Heart Failure Follow-up  HPI: Dana [name] is 63 y.o. female with history of diastolic heart failure s/p cardiomems 02/2018, CAD, s/p DES RCA and LAD 2018, DMI, HTN, HLD, PAD, and OSA on CPAP, PAF s/p ablation, PAD s/p L great toe and 4th toe amputation, stage IIIb CKD, PEA arrest in 12/2020, severe MR s/p mTEER in 2022.   LHC 3/22 showed moderate mid-LAD and mid-RCA disease, severe mid-L-Cx stenosis s/p PCI/DES mid Cx and side branch angioplasty of OM1.  10/22 she was enrolled in FASTR trial and diuresed > 15 lb. RHC 10/22 showed mild to moderate elevating filling pressures with large v-waves in PCWP tracing suggesting severe MR vs severe diastolic dysfunction. TEE 55-60% showed severe central MR she underwent TEER of mitral valve with placement of 1XT W and 1NT W clip on 05/2021. NT W clip was entangled and had to be deployed into lateral part of valve. No additional clip placement could be preformed. Echo 11/04 with EF 50-55% and residual moderate MR.   Multiple admissions over the last year related to ACS/CP/NSTEMI requiring LHC and PCI (see cardiac studies below). 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 Has narrowly missed HD during multiple admissions:   Unfortunately, she was admitted 5/25 for MRSA bacteremia 2/2 left ankle surgical wound infection and E. coli pyelonephritis. She required left BKA 12/18/23. TEE showed no vegetation/endocarditis. S/p mitraclip. Severe MR. ID recommended 2 weeks IV daptomycin . She has had significant depression and anxiety related to undergoing the amputation. Has been seen by psychiatry.   Patient admitted at [facility] on 7/3 with initial diagnosis of HCAP, sepsis, CHF exacerbation.  She was placed on antibiotics including cefepime, linezolid, doxycycline.  She received diuretics on day 1.  Echo at [facility] EF 45%, global HK, RV mildly  reduced, mild-moderate MR s/p Mitra Clip, mod TR. On admission her creatinine was 1.6 but rose to 2.7 on 7/6 and 2.8 on 7/7.  She did receive contrast for CTA chest on admission.  Due to worsening renal function diuretics were held for the last 2 days.  BNP reportedly increased from 25,000-50,000.  Follow-up CXR showed interstitial edema. She later developed AMS. CT head 7/7 at OSH negative for acute process. Transferred to [facility] for further care. Admitted by IM team.  EEG was negative for seizures. Metabolic encephalopathy felt to be related to chronic pain meds (opiates). This improved and returned to baseline.  She was restarted on diuretics. Renal fx also improved back to baseline. Discharged home on 7/12.   Of note she had f/u outpatient labs done 3 days ago on 7/28. Scr was improved from hospital d/c, down to 1.8, K 4.6. Hgb was also 7.6. She was sent to infusion clinic today for [facility] transfusion. Also given 1 dose of IV Lasix  x 80 mg.   Today she returns for HF follow. Here w/ her husband. In good spirits. Feels things are getting better. Remains WC dependent. Hoping she can get a prosthetic leg soon. She noted slight increase in dyspnea this morning. CardioMEMs dPAP elevated at 16 (goal 10). Report full compliance w/ torsemide . As noted above, she got 1 dose of IV Lasix  today w/ blood transfusion. I gave a dose of 20 meQ of PO KCl in clinic.   BP controlled 118/72.   Since being back home, she has been enjoying time w/ her 2 grandchildren. New grandson recently  just born.   Cardiac Studies: - Echo 12/24: EF 55-60%, RV normal, MVg unchanged at 5 mmHg - LHC (1/25): PTCA to prox LAD - LHC (12/24): multivessel CAD. Proximal LAD restenosis - LHC (8/24):  PTCA/DES x 1 proximal LAD, chronic occlusion mid Circ beyond the stented segment, patent obtuse marginal branch with severe distal stenosis (too small for PCI), dominant RCA with moderate proximal stenosis, patent distal stent, LVEDP 19 mmhg. - Echo  (5/24): EF 50-55%. RV mildly reduced, post mitral clip x 2 with XTW and NTW the latter entangled in chords, no obvious SLD noted, mean gradient 5 mmHg, mild residual MR appears medial to the two clips. - LHC (5/24): DES to LAD, osial prox RCA with severe 85% stenosis-->will need staged intervention when renal function stabilized. - LHC (2/24): 2v obstructive CAD. New 90% stenosis pLAD with RFR 0.48. The stent in the LCx is occluded after a large first OM. Patent stent in the distal RCA. S/p PCI of pLAD with DES x 1 overlapping with prior stent. - Echo (1/24): EF 55-60%, normal RV and moderate MR, mG 7.5 mmHg, essentially unchanged from prior echo.   Review of systems complete and found to be negative unless listed in HPI.    Past Medical History:  Diagnosis Date   Acquired absence of left great toe (HCC)    Acquired absence of other left toe(s) (HCC)    ACS (acute coronary syndrome) (HCC)    Acute bronchitis due to COVID-19 virus 03/03/2023   Acute on chronic combined systolic and diastolic CHF (congestive heart failure) (HCC) 09/16/2023   Acute respiratory failure with hypoxia (HCC) 07/20/2023   Anemia    Anoxic brain injury (HCC)    Anxiety    CAD (coronary artery disease)    DES mLAD 04/07/15; DES dRCA & DES pPDA 08/30/16; DES mCX & PTCA OM1 09/29/20   Charcot's joint of foot in type 2 diabetes mellitus (HCC) 12/15/2023   Chronic diastolic (congestive) heart failure (HCC)    Chronic pain    Chronic systolic (congestive) heart failure (HCC)    CKD (chronic kidney disease)    Contrast dye induced nephropathy, possible 09/03/2020   COPD (chronic obstructive pulmonary disease) (HCC)    COVID-19 virus infection 03/03/2023   Demand ischemia (HCC)    Diabetes (HCC)type 1    Diastolic CHF (HCC)    Dyspnea    Encounter for screening mammogram for malignant neoplasm of breast 09/16/2023   Family history of breast cancer 10/23/2021   Gastro-esophageal reflux disease without esophagitis     Hyperlipidemia    Hypertension    Hypertensive heart disease with heart failure (HCC)    Hypoxemia    Idiopathic gout, multiple sites    Leukocytosis 03/29/2022   Localized edema    Low back pain    Mitral regurgitation    Mitral regurgitation    Mixed hyperlipidemia    Mixed incontinence    Morbid (severe) obesity due to excess calories (HCC)    Neuromuscular disorder (HCC)    Neuropathy    Non-ST elevation (NSTEMI) myocardial infarction (HCC)    08/29/20, 09/29/20   OSA (obstructive sleep apnea) 09/03/2020   Other specified hypothyroidism    PAF (paroxysmal atrial fibrillation) (HCC)    s/p pulmonary vein isolation by cryoablation 10/04/15 Dr. Meldon in [facility]   PEA (Pulseless electrical activity) [facility])    ~ 01/13/21 at [facility] during admission DKA, volume overload, possible CAP, hypoxia s/p ACLS with ROSC ~ 10-15  Pneumonia    Primary insomnia    Rheumatoid arthritis (HCC)    Stroke [facility])    Thyroid  disease    Type 1 diabetes mellitus (HCC)    URI with cough and congestion 08/22/2023   Vitamin D  deficiency    Current Outpatient Medications  Medication Sig Dispense Refill   acetaminophen  (TYLENOL ) 325 MG tablet Take 2 tablets (650 mg total) by mouth every 6 (six) hours as needed for mild pain (pain score 1-3) or fever (or Fever >/= 101).     allopurinol  (ZYLOPRIM ) 100 MG tablet Take 0.5 tablets (50 mg total) by mouth daily. 30 tablet 0   aspirin  EC 81 MG tablet Take 1 tablet (81 mg total) by mouth daily. Swallow whole. 30 tablet 12   busPIRone  (BUSPAR ) 15 MG tablet Take 1 tablet (15 mg total) by mouth 3 (three) times daily. 90 tablet 0   carvedilol  (COREG ) 6.25 MG tablet Take 6.25 mg by mouth 2 (two) times daily with a meal.     diclofenac  Sodium (VOLTAREN ) 1 % GEL Apply 2 g topically 4 (four) times daily. 100 g 0   Evolocumab  (REPATHA  SURECLICK) 140 MG/ML SOAJ Inject 140 mg into the skin every 14 (fourteen) days. 6 mL 1   ezetimibe  (ZETIA ) 10 MG tablet Take 1  tablet (10 mg total) by mouth daily. 30 tablet 0   gabapentin  (NEURONTIN ) 300 MG capsule Take 1 capsule (300 mg total) by mouth 2 (two) times daily. 60 capsule 0   Glucagon  (GVOKE HYPOPEN  2-PACK) 0.5 MG/0.1ML SOAJ Inject 0.5 mg into the skin daily as needed. 0.2 mL 5   HYDROCODONE -ACETAMINOPHEN  PO Take by mouth.     insulin  aspart (NOVOLOG  FLEXPEN) 100 UNIT/ML FlexPen Inject 2 - 10 Units before meals based on SSI. 15 mL 3   insulin  glargine (LANTUS ) 100 UNIT/ML Solostar Pen Inject 5 Units into the skin daily. Expires 28 days after first use 15 mL 11   Insulin  Pen Needle (BD PEN NEEDLE NANO 2ND GEN) 32G X 4 MM MISC Inject 1 each into the skin in the morning, at noon, in the evening, and at bedtime. 600 each 3   Insulin  Pen Needle 32G X 4 MM MISC Use with Lantus  pen 100 each 0   latanoprost  (XALATAN ) 0.005 % ophthalmic solution Place 1 drop into both eyes at bedtime. 2.5 mL 0   lidocaine  4 % Place 1 patch onto the skin daily. Remove & Discard patch within 12 hours or as directed by MD 12 patch 0   linaclotide  (LINZESS ) 145 MCG CAPS capsule Take 1 capsule (145 mcg total) by mouth daily before breakfast. 30 capsule 0   Menthol -Methyl Salicylate  (ICY HOT EXTRA STRENGTH) 10-30 % CREA Apply 1 Application topically as needed (bilateral hips and knees). 85 g 1   naloxone  (NARCAN ) nasal spray 4 mg/0.1 mL Use 1 spray into one nostril for opioid overdose. May repeat in 2-3 minutes as needed in the other nostril 6 each 1   nitroGLYCERIN  (NITROSTAT ) 0.4 MG SL tablet DISSOLVE ONE TABLET UNDER THE TONGUE EVERY 5 MINUTES AS NEEDED FOR CHEST PAIN.  DO NOT EXCEED A TOTAL OF 3 DOSES IN 15 MINUTES 50 tablet 3   nutrition supplement, JUVEN, (JUVEN) PACK Take 1 packet by mouth 2 (two) times daily between meals. 30 packet 2   OXYGEN  Inhale 2 L/min into the lungs at bedtime.     pantoprazole  (PROTONIX ) 40 MG tablet Take 1 tablet (40 mg total) by mouth daily. 30 tablet 0  polyethylene glycol powder (GLYCOLAX /MIRALAX ) 17  GM/SCOOP powder Take 1 capful (17 g) with water by mouth daily. 238 g 0   senna-docusate (SENOKOT-S) 8.6-50 MG tablet Take 1 tablet by mouth at bedtime as needed for mild constipation. 30 tablet 0   ticagrelor  (BRILINTA ) 90 MG TABS tablet Take 1 tablet (90 mg total) by mouth 2 (two) times daily. 60 tablet 0   torsemide  (DEMADEX ) 20 MG tablet Take 1 tablet (20 mg total) by mouth daily. 30 tablet 0   Vitamin D , Ergocalciferol , (DRISDOL ) 1.25 MG (50000 UNIT) CAPS capsule Take 1 capsule (50,000 Units total) by mouth every 7 (seven) days. 5 capsule 0   No current facility-administered medications for this visit.   Facility-Administered Medications Ordered in Other Visits  Medication Dose Route Frequency Provider Last Rate Last Admin   0.9 %  sodium chloride  infusion (Manually program via Guardrails IV Fluids)   Intravenous Once Colletta Manuelita Garre, PA-C       furosemide  (LASIX ) injection 80 mg  80 mg Intravenous Once Colletta Manuelita Garre, PA-C       Allergies  Allergen Reactions   Naproxen Hives and Rash   Shellfish-Derived Products Hives, Swelling and Other (See Comments)    Angioedema    Strawberry (Diagnostic) Hives   Celecoxib Other (See Comments)    Stomach pain   Doxepin  Hcl Other (See Comments)    Lethargic, sleeping a lot per pt and family   Glucosamine Hives, Swelling and Other (See Comments)    Angioedema    Iron  Hives and Other (See Comments)    IV iron  transfusion - hives - Feb 2022 hospitalization  Patient said she CAN tolerate if given Benadryl  first   Metoclopramide Other (See Comments)    Facial twitching and stuttering   Metolazone  Other (See Comments)    Acute renal failure   Statins Other (See Comments)    Muscle pain   Social History   Socioeconomic History   Marital status: Married    Spouse name: [name]   Number of children: 2   Years of education: 16   Highest education level: Master's degree (e.g., MA, Dana, MEng, MEd, MSW, MBA)  Occupational History    Occupation: on disability/retired early former Engineer, site  Tobacco Use   Smoking status: Former    Current packs/day: 0.00    Types: Cigarettes    Start date: 16    Quit date: 1984    Years since quitting: 41.6    Passive exposure: Past   Smokeless tobacco: Never  Vaping Use   Vaping status: Never Used  Substance and Sexual Activity   Alcohol use: Never   Drug use: Never   Sexual activity: Not on file  Other Topics Concern   Not on file  Social History Narrative   Not on file   Social Drivers of Health   Financial Resource Strain: Low Risk  (10/24/2023)   Overall Financial Resource Strain (CARDIA)    Difficulty of Paying Living Expenses: Not hard at all  Food Insecurity: No Food Insecurity (01/21/2024)   Hunger Vital Sign    Worried About Running Out of Food in the Last Year: Never true    Ran Out of Food in the Last Year: Never true  Transportation Needs: No Transportation Needs (01/21/2024)   PRAPARE - Administrator, Civil Service (Medical): No    Lack of Transportation (Non-Medical): No  Physical Activity: Insufficiently Active (10/24/2023)   Exercise Vital Sign    Days of  Exercise per Week: 3 days    Minutes of Exercise per Session: 30 min  Stress: Stress Concern Present (10/24/2023)   [employer] of Occupational Health - Occupational Stress Questionnaire    Feeling of Stress : To some extent  Social Connections: Moderately Integrated (12/14/2023)   Social Connection and Isolation Panel    Frequency of Communication with Friends and Family: More than three times a week    Frequency of Social Gatherings with Friends and Family: Three times a week    Attends Religious Services: More than 4 times per year    Active Member of Clubs or Organizations: No    Attends Banker Meetings: Never    Marital Status: Married  Catering manager Violence: Not At Risk (01/23/2024)   Humiliation, Afraid, Rape, and Kick questionnaire    Fear of Current or  Ex-Partner: No    Emotionally Abused: No    Physically Abused: No    Sexually Abused: No   Family History  Problem Relation Age of Onset   Breast cancer Mother 73       contralateral breast ca dx 51   Coronary artery disease Mother    Hypertension Mother    Hyperlipidemia Mother    Diabetes type II Mother    Lung cancer Mother 49   Diabetes Father    Hypertension Father    Mental illness Sister    Bipolar disorder Other    Depression Other    Schizophrenia Other    Cancer Other        MGF's sisters x2; unknown type   There were no vitals taken for this visit.  Wt Readings from Last 3 Encounters:  02/10/24 93.4 kg (206 lb)  01/25/24 93 kg (205 lb 0.4 oz)  01/16/24 99.8 kg (220 lb)   There were no vitals filed for this visit. GENERAL: fatigue appearing, in WC, NAD  Lungs- clear  CARDIAC:  JVP: 8-9 cm          Normal rate with regular rhythm. 2/6 MR murmur.  ABDOMEN: Soft, non-tender, non-distended.  EXTREMITIES: Warm and well perfused. S/p Lt BKA (wrapped). RLE w/o edema  NEUROLOGIC: No obvious FND   CardioMEMs:  Goal 10. Elevated today at 16 Personally reviewed   EKG not performed (Personally reviewed)    ASSESSMENT & PLAN:  1. CAD, H/o NSTEMIs - H/o multiple PCI with stents to mid LAD and pRCA - NSTEMI (2/22). Cath deferred due to AKI. Treated medically.  - NSTEMI (7/22)-->S/p PCI/DES to mid Cx, balloon angioplasty to OM1  - LHC (2/24) for chest pain--> new 90% stenosis pLAD. Successful PCI of pLAD with DES x 1 overlapping with prior stent.  - LHC (5/24) --> DES to LAD, severe 85% ostial prox RCA will need eventual intervention but renal function precludes.  - LHC (8/24): PTCA/DES x 1 proximal LAD - LHC (12/24): with proximal LAD restenosis. S/p successful balloon angioplasty 07/18/23 - Denies CP  - Continue ASA + Brilinta . - Continue Coreg . - Continue Repatha  + Zetia  (allergy  statins).    2. Chronic Diastolic Heart Failure.  - Echo (3/22): EF 55-60%, Grade  II DD, normal RV - RHC (10/22): mild to moderate elevated filling pressures, large v-waves PCWP tracing - TEE (10/22): EF 55-60%, basal inferior akinesis, severe central MR.>>Underwent TEER>>mod residual MR - Echo (11/22): EF 50-55%, severe LV HK, moderate MR - Echo (12/24): EF 55-60%, LV with no RWMA, RV normal, mild MR mG 5 mmHg - TEE (6/25) EF 50-55%, RV  mildly enlarged, SFx nl - Currently WC bound s/p LBKA. She notes slight increase in resting dyspnea today, in setting of mild volume overload. dPAP on CardioMEMs today elevated at 16 (goal 10). Plan to take Furoscix  80 mg daily x 2 days (hold torsemide ). Restart torsemide  60 mg daily on Monday 8/4.  - No SGLT2i with DM1 and recurrent UTIs - Recent labs reviewed Scr 1.8. Repeat BMP in 1 wk    3. CKD IV - Prior h/o CIN following LHC - Previous SCr baseline 1.6-1.8 with multiple AKIs over the last year. New baseline ~ 2.0-2.5 - Followed by Dr. Prescilla. Planning on switching to Dr. Gearline after she retires  - AKI post cath (5/24), 60 cc contrast used. SCr 2.7>4.25 - AKI post cath (8/24), 80 cc contrast used. SCr peak at 5.19 - Most recent BMP 4 days ago was 1.84  - f/u BMP in 1 wk   4. MR  - Severe central MR on TEE on 10/22.   - S/p TEER 11/22 - two clips placed, one became entangled and had to be deployed. - Moderate residual MR on echo 11/22 - Echo (5/24): post mitral clip x 2 with XTW and NTW the latter entangled in chords no obvious SLD noted mean gradient 5 mmHg. Mild residual MR appears medial to the two clips  - Echo 12/24: Stable moderate MR - TEE 6/25 showed severe MR but no vegetation - 2D Echo 7/25 showed only mild-mod. No loud murmur ausculted on exam today    5. OSA - Continue CPAP - No change   6. DM I - On insulin  - followed by PCP  - No SGLT2i with DM1 and recurrent UTIs  7. Recent MSRA Bacteremia - 2/2 LE surgical wound infection, TEE negative for vegetations - completed 2 wks of dapto per ID   8.   Severe left ankle surgical wound infection - required left BKA 12/18/23 - healing. Followed by Dr. Harden   Encouraged to get back on track w/ sending daily CardioMEMs readings. F/u w/ APP in 4 wk to reassess volume status.   Caffie Shed, PA-C  9:27 AM

## 2024-02-14 NOTE — Progress Notes (Signed)
 Medication Samples have been provided to the patient.  Drug name: Furoscix        Strength: 80 mg        Qty: 2  LOT: [id number]  Exp.Date: 06[birth year]/1925  Dosing instructions: Take as directed by heart failure clinic  The patient has been instructed regarding the correct time, dose, and frequency of taking this medication, including desired effects and most common side effects.   Keene HERO Pau Banh 2:37 PM 02/14/2024

## 2024-02-15 LAB — BPAM RBC
Blood Product Expiration Date: 202508242359
ISSUE DATE / TIME: 202508010916
Unit Type and Rh: 600

## 2024-02-15 LAB — TYPE AND SCREEN
ABO/RH(D): A NEG
Antibody Screen: NEGATIVE
Unit division: 0

## 2024-02-17 ENCOUNTER — Other Ambulatory Visit (HOSPITAL_COMMUNITY): Payer: Self-pay

## 2024-02-17 ENCOUNTER — Encounter: Payer: Self-pay | Admitting: Family

## 2024-02-18 ENCOUNTER — Other Ambulatory Visit: Payer: Self-pay

## 2024-02-19 ENCOUNTER — Telehealth: Payer: Self-pay | Admitting: Orthopedic Surgery

## 2024-02-19 ENCOUNTER — Other Ambulatory Visit

## 2024-02-19 NOTE — Telephone Encounter (Signed)
 Thispt had surgery 12/18/2023 and has not had post op visit. Need to see in the office. Can you please call to sch for Friday with Erin and I will make a time.

## 2024-02-19 NOTE — Telephone Encounter (Signed)
 Pt called asking for antibiotics for cellulitis on her leg. Pt asking for meds to be sent to [facility] Dr. Belfonte Rossford. Please call pt at [phone].

## 2024-02-21 ENCOUNTER — Other Ambulatory Visit (HOSPITAL_COMMUNITY): Payer: Self-pay

## 2024-02-24 ENCOUNTER — Encounter: Payer: Self-pay | Admitting: Orthopedic Surgery

## 2024-02-24 ENCOUNTER — Ambulatory Visit: Admitting: Orthopedic Surgery

## 2024-02-24 ENCOUNTER — Other Ambulatory Visit

## 2024-02-24 DIAGNOSIS — Z89512 Acquired absence of left leg below knee: Secondary | ICD-10-CM

## 2024-02-24 NOTE — Progress Notes (Signed)
 Office Visit Note   Patient: Dana [name]           Date of Birth: [birth year]           MRN: [id number] Visit Date: 02/24/2024              Requested by: Sherre Clapper, MD [address] [place],  [place] [address] PCP: Sherre Clapper, MD  Chief Complaint  Patient presents with   Left Leg - Routine Post Op    12/18/2023 left BKA       HPI: Patient is a 63 year old woman who is seen for initial evaluation from her left below-knee amputation.  Surgery on June 4.  Assessment & Plan: Visit Diagnoses:  1. Left below-knee amputee [facility])     Plan: Patient has healed well she still has swelling.  She is provided a prescription for [employer] for shrinker and prosthetic limb.  Follow-Up Instructions: Return in about 4 weeks (around 03/23/2024).   Ortho Exam  Patient is alert, oriented, no adenopathy, well-dressed, normal affect, normal respiratory effort. Examination incision is well-healed there is no redness no cellulitis no drainage.  She does have some underlying swelling.  Patient is a new left transtibial  amputee.  Patient's current comorbidities are not expected to impact the ability to function with the prescribed prosthesis. Patient verbally communicates a strong desire to use a prosthesis. Patient currently requires mobility aids to ambulate without a prosthesis.  Expects not to use mobility aids with a new prosthesis. Patient is expected to resume or reach their K Level within 6 months. Patient was active before the amputation and independent with stairs, uneven terrain, varying cadence, and a community ambulator.  Patient is a K2 level ambulator that will use a prosthesis to walk around their home and the community over low level environmental barriers.        Imaging: No results found.   Labs: Lab Results  Component Value Date   HGBA1C 10.2 (H) 11/12/2023   HGBA1C 9.0 (H) 05/14/2023   HGBA1C 8.5 (H) 02/13/2023   ESRSEDRATE 94 (H) 03/23/2020    ESRSEDRATE 85 (H) 11/07/2017   CRP 25 (H) 03/23/2020   LABURIC 6.5 05/14/2023   LABURIC 9.0 (H) 01/19/2022   LABURIC 9.3 (H) 04/25/2021   REPTSTATUS 01/25/2024 FINAL 01/20/2024   CULT  01/20/2024    NO GROWTH 5 DAYS Performed at [facility], [address] [place], [place] [address]    LABORGA KLEBSIELLA PNEUMONIAE (A) 01/03/2024   LABORGA ESCHERICHIA COLI (A) 01/03/2024     Lab Results  Component Value Date   ALBUMIN  3.4 (L) 02/10/2024   ALBUMIN  2.3 (L) 01/25/2024   ALBUMIN  2.3 (L) 01/23/2024    Lab Results  Component Value Date   MG 2.0 01/24/2024   MG 2.0 01/21/2024   MG 1.8 12/25/2023   Lab Results  Component Value Date   VD25OH 15.7 (L) 02/10/2024   VD25OH 22.70 (L) 11/12/2023   VD25OH 25.2 (L) 04/04/2023    No results found for: PREALBUMIN    Latest Ref Rng & Units 02/10/2024   12:11 PM 01/23/2024    2:26 AM 01/22/2024    2:56 AM  CBC EXTENDED  WBC 3.4 - 10.8 x10E3/uL 8.2  8.4  9.0   RBC 3.77 - 5.28 x10E6/uL 2.80  3.45  3.59   Hemoglobin 11.1 - 15.9 g/dL 7.6  9.2  9.6   HCT 65.9 - 46.6 % 26.2  29.8  30.9  Platelets 150 - 450 x10E3/uL 202  320  290   NEUT# 1.4 - 7.0 x10E3/uL 6.4     Lymph# 0.7 - 3.1 x10E3/uL 1.0        There is no height or weight on file to calculate BMI.  Orders:  No orders of the defined types were placed in this encounter.  No orders of the defined types were placed in this encounter.    Procedures: No procedures performed  Clinical Data: No additional findings.  ROS:  All other systems negative, except as noted in the HPI. Review of Systems  Objective: Vital Signs: There were no vitals taken for this visit.  Specialty Comments:  No specialty comments available.  PMFS History: Patient Active Problem List   Diagnosis Date Noted   Chronic heart failure with preserved ejection fraction (HFpEF) (HCC) 02/11/2024   Acute cough 01/26/2024   Allergic rhinitis 01/26/2024   Obesity, class 1 01/22/2024   Acute  on chronic systolic CHF (congestive heart failure) (HCC) 01/20/2024   Acute metabolic encephalopathy 01/20/2024   ABLA (acute blood loss anemia) 01/02/2024   Moderate episode of recurrent major depressive disorder (HCC) 01/01/2024   Left below-knee amputee (HCC) 12/27/2023   Status post below-knee amputation of left lower extremity (HCC) 12/20/2023   High pulmonary arterial pressure (HCC) 12/17/2023   Severe protein-calorie malnutrition (HCC) 12/15/2023   Severe sepsis secondary to postoperative left ankle infection and MRSA bacteremia 12/14/2023   MRSA bacteremia - end of therapy with Daptomycin  12/28/2023. 12/14/2023   Depression with anxiety 12/14/2023   Acute pyelonephritis 12/14/2023   Trimalleolar fracture of ankle, closed 11/11/2023   Situational insomnia 10/04/2023   Acute on chronic combined systolic and diastolic CHF (congestive heart failure) (HCC) 09/16/2023   SOB (shortness of breath) 08/27/2023   Hospital discharge follow-up 07/29/2023   Coronary artery disease involving native coronary artery of native heart with angina pectoris (HCC) 07/20/2023   Presence of heart assist device (HCC) 07/13/2023   Moderate recurrent major depression (HCC) 07/13/2023   History of amputation of hallux (HCC) 05/24/2023   Bilateral hand pain 05/14/2023   Contrast-induced nephropathy 02/20/2023   Unstable angina (HCC) 09/07/2022   Chronic idiopathic constipation 09/01/2022   Hyperphosphatemia 01/09/2022   Genetic testing 11/07/2021   Family history of breast cancer 10/23/2021   GAD (generalized anxiety disorder) 07/11/2021   Mixed hyperlipidemia 07/11/2021   S/P mitral valve clip implantation 05/18/2021   History of cardiac arrest 02/06/2021   Impaired mobility and ADLs 01/20/2021   Left-sided weakness 01/20/2021   Drug-induced myopathy 01/03/2021   Secondary hyperparathyroidism of renal origin (HCC) 11/18/2020   Vitamin D  insufficiency 11/18/2020   Uncontrolled type 1 diabetes mellitus  with hyperglycemia, with long-term current use of insulin  (HCC) 10/13/2020   CKD stage 4 due to type 1 diabetes mellitus (HCC) - baseline 2.2 - 2.4 10/13/2020   NSTEMI (non-ST elevated myocardial infarction) (HCC) 09/03/2020   Contrast dye induced nephropathy, possible 09/03/2020   OSA on CPAP 09/03/2020   Insulin  dependent type 2 diabetes mellitus (HCC) 09/03/2020   Need for COVID-19 vaccine 06/28/2020   Chronic gout without tophus 03/23/2020   Pain in both hands 03/23/2020   Hypomagnesemia 12/21/2019   Lumbar back pain 10/05/2019   Hypertensive heart and renal disease with congestive heart failure (HCC) 09/18/2019   Chronic combined systolic (congestive) and diastolic (congestive) heart failure (HCC) 11/07/2017   Class 2 severe obesity due to excess calories with serious comorbidity and body mass index (BMI) of 39.0 to  39.9 in adult [facility]) 11/07/2017   Hyperlipidemia LDL goal <70 12/14/2016   Anemia in CKD (chronic kidney disease) 03/23/2016   Chronic pain syndrome 03/23/2016   Coronary artery disease of native artery of native heart with stable angina pectoris (HCC) 03/23/2016   GERD without esophagitis 03/23/2016   On home oxygen  therapy 03/23/2016   S/P ablation of atrial fibrillation 10/04/2015   Diabetic polyneuropathy associated with type 2 diabetes mellitus (HCC) 04/02/2014   Past Medical History:  Diagnosis Date   Acquired absence of left great toe (HCC)    Acquired absence of other left toe(s) (HCC)    ACS (acute coronary syndrome) (HCC)    Acute bronchitis due to COVID-19 virus 03/03/2023   Acute on chronic combined systolic and diastolic CHF (congestive heart failure) (HCC) 09/16/2023   Acute respiratory failure with hypoxia (HCC) 07/20/2023   Anemia    Anoxic brain injury (HCC)    Anxiety    CAD (coronary artery disease)    DES mLAD 04/07/15; DES dRCA & DES pPDA 08/30/16; DES mCX & PTCA OM1 09/29/20   Charcot's joint of foot in type 2 diabetes mellitus (HCC) 12/15/2023    Chronic diastolic (congestive) heart failure (HCC)    Chronic pain    Chronic systolic (congestive) heart failure (HCC)    CKD (chronic kidney disease)    Contrast dye induced nephropathy, possible 09/03/2020   COPD (chronic obstructive pulmonary disease) (HCC)    COVID-19 virus infection 03/03/2023   Demand ischemia (HCC)    Diabetes (HCC)type 1    Diastolic CHF (HCC)    Dyspnea    Encounter for screening mammogram for malignant neoplasm of breast 09/16/2023   Family history of breast cancer 10/23/2021   Gastro-esophageal reflux disease without esophagitis    Hyperlipidemia    Hypertension    Hypertensive heart disease with heart failure (HCC)    Hypoxemia    Idiopathic gout, multiple sites    Leukocytosis 03/29/2022   Localized edema    Low back pain    Mitral regurgitation    Mitral regurgitation    Mixed hyperlipidemia    Mixed incontinence    Morbid (severe) obesity due to excess calories (HCC)    Neuromuscular disorder (HCC)    Neuropathy    Non-ST elevation (NSTEMI) myocardial infarction (HCC)    08/29/20, 09/29/20   OSA (obstructive sleep apnea) 09/03/2020   Other specified hypothyroidism    PAF (paroxysmal atrial fibrillation) (HCC)    s/p pulmonary vein isolation by cryoablation 10/04/15 Dr. Meldon in [facility]   PEA (Pulseless electrical activity) [facility])    ~ 01/13/21 at [facility] during admission DKA, volume overload, possible CAP, hypoxia s/p ACLS with ROSC ~ 10-15   Pneumonia    Primary insomnia    Rheumatoid arthritis (HCC)    Stroke (HCC)    Thyroid  disease    Type 1 diabetes mellitus (HCC)    URI with cough and congestion 08/22/2023   Vitamin D  deficiency     Family History  Problem Relation Age of Onset   Breast cancer Mother 92       contralateral breast ca dx 48   Coronary artery disease Mother    Hypertension Mother    Hyperlipidemia Mother    Diabetes type II Mother    Lung cancer Mother 44   Diabetes Father    Hypertension Father     Mental illness Sister    Bipolar disorder Other    Depression Other    Schizophrenia  Other    Cancer Other        MGF's sisters x2; unknown type    Past Surgical History:  Procedure Laterality Date   ABDOMINAL HYSTERECTOMY     Partial- Still has both ovaries   AMPUTATION Left 12/18/2023   Procedure: AMPUTATION BELOW KNEE;  Surgeon: Harden Jerona GAILS, MD;  Location: [facility] OR;  Service: Orthopedics;  Laterality: Left;   ANKLE FUSION Left 11/13/2023   Procedure: HINDFOOT FUSION FIXATION OF ANKLE;  Surgeon: Kendal Franky SQUIBB, MD;  Location: [facility] OR;  Service: Orthopedics;  Laterality: Left;   CARDIOVASCULAR STRESS TEST  08/13/2014   Nuclear; Normal   CARPAL TUNNEL RELEASE     CATARACT EXTRACTION, BILATERAL     CESAREAN SECTION     CHOLECYSTECTOMY     CORONARY ANGIOPLASTY WITH STENT PLACEMENT     3 blockages/ 1 stent   CORONARY BALLOON ANGIOPLASTY N/A 07/18/2023   Procedure: CORONARY BALLOON ANGIOPLASTY;  Surgeon: Verlin Lonni BIRCH, MD;  Location: [facility] INVASIVE CV LAB;  Service: Cardiovascular;  Laterality: N/A;   CORONARY PRESSURE/FFR STUDY N/A 09/07/2022   Procedure: INTRAVASCULAR PRESSURE WIRE/FFR STUDY;  Surgeon: Swaziland, Peter M, MD;  Location: [facility] INVASIVE CV LAB;  Service: Cardiovascular;  Laterality: N/A;   CORONARY STENT INTERVENTION N/A 09/29/2020   Procedure: CORONARY STENT INTERVENTION;  Surgeon: Wonda Sharper, MD;  Location: [facility] INVASIVE CV LAB;  Service: Cardiovascular;  Laterality: N/A;  CFX   CORONARY STENT INTERVENTION N/A 09/07/2022   Procedure: CORONARY STENT INTERVENTION;  Surgeon: Swaziland, Peter M, MD;  Location: [facility] INVASIVE CV LAB;  Service: Cardiovascular;  Laterality: N/A;   CORONARY STENT INTERVENTION N/A 02/13/2023   Procedure: CORONARY STENT INTERVENTION;  Surgeon: Verlin Lonni BIRCH, MD;  Location: [facility] INVASIVE CV LAB;  Service: Cardiovascular;  Laterality: N/A;  LAD   CORONARY ULTRASOUND/IVUS N/A 02/13/2023   Procedure: Coronary Ultrasound/IVUS;  Surgeon: Verlin Lonni BIRCH, MD;  Location: [facility] INVASIVE CV LAB;  Service: Cardiovascular;  Laterality: N/A;   CORONARY/GRAFT ACUTE MI REVASCULARIZATION N/A 12/08/2022   Procedure: Coronary/Graft Acute MI Revascularization;  Surgeon: Anner Alm ORN, MD;  Location: [facility] INVASIVE CV LAB;  Service: Cardiovascular;  Laterality: N/A;   EYE SURGERY     LEFT HEART CATH AND CORONARY ANGIOGRAPHY N/A 09/29/2020   Procedure: LEFT HEART CATH AND CORONARY ANGIOGRAPHY;  Surgeon: Wonda Sharper, MD;  Location: [facility] INVASIVE CV LAB;  Service: Cardiovascular;  Laterality: N/A;   LEFT HEART CATH AND CORONARY ANGIOGRAPHY N/A 09/07/2022   Procedure: LEFT HEART CATH AND CORONARY ANGIOGRAPHY;  Surgeon: Swaziland, Peter M, MD;  Location: [facility] INVASIVE CV LAB;  Service: Cardiovascular;  Laterality: N/A;   LEFT HEART CATH AND CORONARY ANGIOGRAPHY N/A 12/08/2022   Procedure: LEFT HEART CATH AND CORONARY ANGIOGRAPHY;  Surgeon: Anner Alm ORN, MD;  Location: [facility] INVASIVE CV LAB;  Service: Cardiovascular;  Laterality: N/A;   LEFT HEART CATH AND CORONARY ANGIOGRAPHY N/A 02/13/2023   Procedure: LEFT HEART CATH AND CORONARY ANGIOGRAPHY;  Surgeon: Verlin Lonni BIRCH, MD;  Location: [facility] INVASIVE CV LAB;  Service: Cardiovascular;  Laterality: N/A;   LEFT HEART CATH AND CORONARY ANGIOGRAPHY N/A 07/16/2023   Procedure: LEFT HEART CATH AND CORONARY ANGIOGRAPHY;  Surgeon: Verlin Lonni BIRCH, MD;  Location: [facility] INVASIVE CV LAB;  Service: Cardiovascular;  Laterality: N/A;   RIGHT HEART CATH N/A 04/27/2021   Procedure: RIGHT HEART CATH;  Surgeon: Cherrie Toribio SAUNDERS, MD;  Location: [facility] INVASIVE CV LAB;  Service: Cardiovascular;  Laterality: N/A;   RIGHT/LEFT HEART CATH AND CORONARY ANGIOGRAPHY  N/A 01/07/2017   Procedure: Right/Left Heart Cath and Coronary Angiography;  Surgeon: Rolan Ezra RAMAN, MD;  Location: [facility] INVASIVE CV LAB;  Service: Cardiovascular;  Laterality: N/A;   ROTATOR CUFF REPAIR     TEE WITHOUT CARDIOVERSION N/A 04/28/2021    Procedure: TRANSESOPHAGEAL ECHOCARDIOGRAM (TEE);  Surgeon: Cherrie Toribio SAUNDERS, MD;  Location: [facility] ENDOSCOPY;  Service: Cardiovascular;  Laterality: N/A;   TEE WITHOUT CARDIOVERSION N/A 05/18/2021   Procedure: TRANSESOPHAGEAL ECHOCARDIOGRAM (TEE);  Surgeon: Thukkani, Arun K, MD;  Location: [facility] INVASIVE CV LAB;  Service: Cardiovascular;  Laterality: N/A;   TOE AMPUTATION Left    TRANSCATHETER MITRAL EDGE TO EDGE REPAIR N/A 05/18/2021   Procedure: MITRAL VALVE REPAIR;  Surgeon: Wendel Lurena POUR, MD;  Location: [facility] INVASIVE CV LAB;  Service: Cardiovascular;  Laterality: N/A;   TRANSESOPHAGEAL ECHOCARDIOGRAM (CATH LAB) N/A 12/23/2023   Procedure: TRANSESOPHAGEAL ECHOCARDIOGRAM;  Surgeon: Kate Lonni CROME, MD;  Location: [facility] INVASIVE CV LAB;  Service: Cardiovascular;  Laterality: N/A;   US  ECHOCARDIOGRAPHY  05/2017   Normal   Social History   Occupational History   Occupation: on disability/retired early former Engineer, site  Tobacco Use   Smoking status: Former    Current packs/day: 0.00    Types: Cigarettes    Start date: 17    Quit date: 1984    Years since quitting: 41.6    Passive exposure: Past   Smokeless tobacco: Never  Vaping Use   Vaping status: Never Used  Substance and Sexual Activity   Alcohol use: Never   Drug use: Never   Sexual activity: Not on file

## 2024-02-25 ENCOUNTER — Encounter: Payer: Self-pay | Admitting: Family

## 2024-02-25 ENCOUNTER — Other Ambulatory Visit

## 2024-02-25 DIAGNOSIS — I5042 Chronic combined systolic (congestive) and diastolic (congestive) heart failure: Secondary | ICD-10-CM

## 2024-02-27 ENCOUNTER — Other Ambulatory Visit: Payer: Self-pay | Admitting: Family Medicine

## 2024-02-27 ENCOUNTER — Telehealth: Payer: Self-pay | Admitting: Family Medicine

## 2024-02-27 ENCOUNTER — Other Ambulatory Visit (HOSPITAL_COMMUNITY): Payer: Self-pay

## 2024-02-27 MED ORDER — HYDROCODONE-ACETAMINOPHEN 7.5-325 MG PO TABS
1.0000 | ORAL_TABLET | Freq: Three times a day (TID) | ORAL | 0 refills | Status: DC | PRN
  Filled 2024-02-27: qty 90, 30d supply, fill #0

## 2024-02-27 NOTE — Telephone Encounter (Signed)
 Sent requested to provider.

## 2024-02-27 NOTE — Telephone Encounter (Signed)
 Copied from CRM [id number]. Topic: Clinical - Medication Refill >> Feb 27, 2024 10:37 AM Tiffini S wrote: Medication: HYDROCODONE -ACETAMINOPHEN  PO   Has the patient contacted their pharmacy? Yes, pharmacy faxed request  (Agent: If no, request that the patient contact the pharmacy for the refill. If patient does not wish to contact the pharmacy document the reason why and proceed with request.) (Agent: If yes, when and what did the pharmacy advise?)   This is the patient's preferred pharmacy:  [place] [facility] [address] [place] [address] Phone: [phone] Fax: [phone]

## 2024-02-27 NOTE — Telephone Encounter (Unsigned)
 Copied from CRM [id number]. Topic: Clinical - Medication Refill >> Feb 27, 2024 10:37 AM Tiffini S wrote: Medication: HYDROCODONE -ACETAMINOPHEN  PO  Has the patient contacted their pharmacy? Yes, pharmacy faxed request  (Agent: If no, request that the patient contact the pharmacy for the refill. If patient does not wish to contact the pharmacy document the reason why and proceed with request.) (Agent: If yes, when and what did the pharmacy advise?)  This is the patient's preferred pharmacy:  [place] [facility] [address] [place] [address] Phone: [phone] Fax: [phone]   Is this the correct pharmacy for this prescription? Yes If no, delete pharmacy and type the correct one.   Has the prescription been filled recently? Yes  Is the patient out of the medication? Yes, patient have two tablets left   Has the patient been seen for an appointment in the last year OR does the patient have an upcoming appointment? Yes  Can we respond through MyChart? Yes  Agent: Please be advised that Rx refills may take up to 3 business days. We ask that you follow-up with your pharmacy.

## 2024-02-28 ENCOUNTER — Other Ambulatory Visit (HOSPITAL_COMMUNITY): Payer: Self-pay

## 2024-02-28 DIAGNOSIS — N1832 Chronic kidney disease, stage 3b: Secondary | ICD-10-CM | POA: Diagnosis not present

## 2024-02-28 DIAGNOSIS — D631 Anemia in chronic kidney disease: Secondary | ICD-10-CM | POA: Diagnosis not present

## 2024-03-02 ENCOUNTER — Other Ambulatory Visit: Payer: Self-pay | Admitting: Family Medicine

## 2024-03-02 ENCOUNTER — Other Ambulatory Visit (HOSPITAL_COMMUNITY): Payer: Self-pay

## 2024-03-03 ENCOUNTER — Telehealth: Payer: Self-pay

## 2024-03-03 ENCOUNTER — Other Ambulatory Visit: Payer: Self-pay

## 2024-03-03 ENCOUNTER — Encounter: Payer: Self-pay | Admitting: Family

## 2024-03-03 DIAGNOSIS — I5042 Chronic combined systolic (congestive) and diastolic (congestive) heart failure: Secondary | ICD-10-CM

## 2024-03-03 DIAGNOSIS — F411 Generalized anxiety disorder: Secondary | ICD-10-CM | POA: Diagnosis not present

## 2024-03-03 NOTE — Telephone Encounter (Signed)
 Copied from CRM [id number]. Topic: Appointments - Scheduling Inquiry for Clinic >> Mar 03, 2024 11:25 AM Treva T wrote: Reason for CRM: Received call from patient, states lab orders were faxed from her cardiologist, and patient is requesting to have labs drawn at PCP office.   Called and spoke to office to verify if lab orders received, per Amber at [facility] verified no lab orders were received. Advised to inform patient to have provider refax orders, and office will contact patient to schedule appointment.   Informed patient of above, verbalized understanding.   Patient can be reached for scheduling lab appointment at [phone].

## 2024-03-03 NOTE — Telephone Encounter (Signed)
 Lab orders placed and faxed to Abigail Free, MD's office per pt at [phone].

## 2024-03-04 ENCOUNTER — Other Ambulatory Visit: Payer: Self-pay

## 2024-03-04 ENCOUNTER — Other Ambulatory Visit

## 2024-03-04 ENCOUNTER — Other Ambulatory Visit (HOSPITAL_COMMUNITY): Payer: Self-pay

## 2024-03-04 DIAGNOSIS — I5042 Chronic combined systolic (congestive) and diastolic (congestive) heart failure: Secondary | ICD-10-CM

## 2024-03-05 ENCOUNTER — Telehealth: Payer: Self-pay | Admitting: Family Medicine

## 2024-03-05 LAB — BASIC METABOLIC PANEL WITH GFR
BUN/Creatinine Ratio: 30 — ABNORMAL HIGH (ref 12–28)
BUN: 51 mg/dL — ABNORMAL HIGH (ref 8–27)
CO2: 26 mmol/L (ref 20–29)
Calcium: 9.2 mg/dL (ref 8.7–10.3)
Chloride: 100 mmol/L (ref 96–106)
Creatinine, Ser: 1.69 mg/dL — ABNORMAL HIGH (ref 0.57–1.00)
Glucose: 145 mg/dL — ABNORMAL HIGH (ref 70–99)
Potassium: 3.9 mmol/L (ref 3.5–5.2)
Sodium: 140 mmol/L (ref 134–144)
eGFR: 34 mL/min/1.73 — ABNORMAL LOW (ref >59)

## 2024-03-05 LAB — PRO B NATRIURETIC PEPTIDE: NT-Pro BNP: 14760 pg/mL — ABNORMAL HIGH (ref 0–287)

## 2024-03-05 NOTE — Telephone Encounter (Signed)
 [facility] [phone]

## 2024-03-06 ENCOUNTER — Encounter: Payer: Self-pay | Admitting: Family

## 2024-03-08 ENCOUNTER — Ambulatory Visit: Payer: Self-pay | Admitting: Family Medicine

## 2024-03-09 ENCOUNTER — Telehealth: Payer: Self-pay | Admitting: Family Medicine

## 2024-03-09 DIAGNOSIS — S82853A Displaced trimalleolar fracture of unspecified lower leg, initial encounter for closed fracture: Secondary | ICD-10-CM | POA: Diagnosis not present

## 2024-03-09 DIAGNOSIS — Z89512 Acquired absence of left leg below knee: Secondary | ICD-10-CM | POA: Diagnosis not present

## 2024-03-09 NOTE — Telephone Encounter (Signed)
 [facility] [phone]

## 2024-03-10 DIAGNOSIS — E10610 Type 1 diabetes mellitus with diabetic neuropathic arthropathy: Secondary | ICD-10-CM | POA: Diagnosis not present

## 2024-03-10 DIAGNOSIS — I088 Other rheumatic multiple valve diseases: Secondary | ICD-10-CM | POA: Diagnosis not present

## 2024-03-10 DIAGNOSIS — S82853A Displaced trimalleolar fracture of unspecified lower leg, initial encounter for closed fracture: Secondary | ICD-10-CM | POA: Diagnosis not present

## 2024-03-10 DIAGNOSIS — I480 Paroxysmal atrial fibrillation: Secondary | ICD-10-CM | POA: Diagnosis not present

## 2024-03-10 DIAGNOSIS — N179 Acute kidney failure, unspecified: Secondary | ICD-10-CM | POA: Diagnosis not present

## 2024-03-10 DIAGNOSIS — N184 Chronic kidney disease, stage 4 (severe): Secondary | ICD-10-CM | POA: Diagnosis not present

## 2024-03-10 DIAGNOSIS — E1065 Type 1 diabetes mellitus with hyperglycemia: Secondary | ICD-10-CM | POA: Diagnosis not present

## 2024-03-10 DIAGNOSIS — Z89512 Acquired absence of left leg below knee: Secondary | ICD-10-CM | POA: Diagnosis not present

## 2024-03-10 DIAGNOSIS — I110 Hypertensive heart disease with heart failure: Secondary | ICD-10-CM | POA: Diagnosis not present

## 2024-03-10 DIAGNOSIS — E1022 Type 1 diabetes mellitus with diabetic chronic kidney disease: Secondary | ICD-10-CM | POA: Diagnosis not present

## 2024-03-10 DIAGNOSIS — I25119 Atherosclerotic heart disease of native coronary artery with unspecified angina pectoris: Secondary | ICD-10-CM | POA: Diagnosis not present

## 2024-03-10 DIAGNOSIS — D631 Anemia in chronic kidney disease: Secondary | ICD-10-CM | POA: Diagnosis not present

## 2024-03-10 DIAGNOSIS — E1042 Type 1 diabetes mellitus with diabetic polyneuropathy: Secondary | ICD-10-CM | POA: Diagnosis not present

## 2024-03-10 DIAGNOSIS — I5043 Acute on chronic combined systolic (congestive) and diastolic (congestive) heart failure: Secondary | ICD-10-CM | POA: Diagnosis not present

## 2024-03-11 NOTE — Progress Notes (Incomplete)
 ADVANCED HF CLINIC NOTE   PCP: Dana Clapper, MD HF Cardiologist: Dana Fuel, MD   HPI: Dana [name] is 63 y.o. female with history of diastolic heart failure s/p cardiomems 02/2018, CAD, s/p DES RCA and LAD 2018, DMI, HTN, HLD, PAD, and OSA on CPAP, PAF s/p ablation, PAD s/p L great toe and 4th toe amputation, stage IIIb CKD, PEA arrest in 12/2020, severe MR s/p mTEER in 2022.   LHC 3/22 showed moderate mid-LAD and mid-RCA disease, severe mid-L-Cx stenosis s/p PCI/DES mid Cx and side branch angioplasty of OM1.  10/22 she was enrolled in FASTR trial and diuresed > 15 lb. RHC 10/22 showed mild to moderate elevating filling pressures with large v-waves in PCWP tracing suggesting severe MR vs severe diastolic dysfunction. TEE 55-60% showed severe central MR she underwent TEER of mitral valve with placement of 1XT W and 1NT W clip on 05/2021. NT W clip was entangled and had to be deployed into lateral part of valve. No additional clip placement could be preformed. Echo 11/04 with EF 50-55% and residual moderate MR.   Multiple admissions over 2024-2025 related to ACS/CP/NSTEMI requiring LHC and PCI (see cardiac studies below). 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 Has narrowly missed HD during multiple admissions:   Today she returns for HF follow up with her husband. Overall feeling great!. Getting fitted for prostehtic today. Able to do ADLs, housework and cooking. Her 62 year old mother lives with her and they help each other out during the day. Denies increasing SOB, palpitations, abnormal bleeding, CP, dizziness, edema, or PND/Orthopnea. Appetite ok. Taking all medications. Used Furoscix  2 times this past month, but otherwise volume has been stable. He has a new parrot, Dana [name] & a 109 month old grandson, Dana [name].  Cardiac Studies: - TEE 6/25: LVEF 50-55%, RV ok, no clot, severe MR, mitra clip present  - Echo 12/24: EF 55-60%, RV normal, MVg unchanged at 5 mmHg  - LHC (1/25): PTCA  to prox LAD  - LHC (12/24): multivessel CAD. Proximal LAD restenosis  - LHC (8/24):  PTCA/DES x 1 proximal LAD, chronic occlusion mid Circ beyond the stented segment, patent obtuse marginal branch with severe distal stenosis (too small for PCI), dominant RCA with moderate proximal stenosis, patent distal stent, LVEDP 19 mmhg.  - Echo (5/24): EF 50-55%. RV mildly reduced, post mitral clip x 2 with XTW and NTW the latter entangled in chords, no obvious SLD noted, mean gradient 5 mmHg, mild residual MR appears medial to the two clips.  - LHC (5/24): DES to LAD, osial prox RCA with severe 85% stenosis-->will need staged intervention when renal function stabilized.  - LHC (2/24): 2v obstructive CAD. New 90% stenosis pLAD with RFR 0.48. The stent in the LCx is occluded after a large first OM. Patent stent in the distal RCA. S/p PCI of pLAD with DES x 1 overlapping with prior stent.  - Echo (1/24): EF 55-60%, normal RV and moderate MR, mG 7.5 mmHg, essentially unchanged from prior echo.   Review of systems complete and found to be negative unless listed in HPI.    Past Medical History:  Diagnosis Date   Acquired absence of left great toe (HCC)    Acquired absence of other left toe(s) (HCC)    ACS (acute coronary syndrome) (HCC)    Acute bronchitis due to COVID-19 virus 03/03/2023   Acute on chronic combined systolic and diastolic CHF (congestive heart failure) (HCC) 09/16/2023   Acute respiratory failure with  hypoxia (HCC) 07/20/2023   Anemia    Anoxic brain injury (HCC)    Anxiety    CAD (coronary artery disease)    DES mLAD 04/07/15; DES dRCA & DES pPDA 08/30/16; DES mCX & PTCA OM1 09/29/20   Charcot's joint of foot in type 2 diabetes mellitus (HCC) 12/15/2023   Chronic diastolic (congestive) heart failure (HCC)    Chronic pain    Chronic systolic (congestive) heart failure (HCC)    CKD (chronic kidney disease)    Contrast dye induced nephropathy, possible 09/03/2020   COPD (chronic  obstructive pulmonary disease) (HCC)    COVID-19 virus infection 03/03/2023   Demand ischemia (HCC)    Diabetes (HCC)type 1    Diastolic CHF (HCC)    Dyspnea    Encounter for screening mammogram for malignant neoplasm of breast 09/16/2023   Family history of breast cancer 10/23/2021   Gastro-esophageal reflux disease without esophagitis    Hyperlipidemia    Hypertension    Hypertensive heart disease with heart failure (HCC)    Hypoxemia    Idiopathic gout, multiple sites    Leukocytosis 03/29/2022   Localized edema    Low back pain    Mitral regurgitation    Mitral regurgitation    Mixed hyperlipidemia    Mixed incontinence    Morbid (severe) obesity due to excess calories (HCC)    Neuromuscular disorder (HCC)    Neuropathy    Non-ST elevation (NSTEMI) myocardial infarction (HCC)    08/29/20, 09/29/20   OSA (obstructive sleep apnea) 09/03/2020   Other specified hypothyroidism    PAF (paroxysmal atrial fibrillation) (HCC)    s/p pulmonary vein isolation by cryoablation 10/04/15 Dr. Meldon in [facility]   PEA (Pulseless electrical activity) [facility])    ~ 01/13/21 at [facility] during admission DKA, volume overload, possible CAP, hypoxia s/p ACLS with ROSC ~ 10-15   Pneumonia    Primary insomnia    Rheumatoid arthritis (HCC)    Stroke (HCC)    Thyroid  disease    Type 1 diabetes mellitus (HCC)    URI with cough and congestion 08/22/2023   Vitamin D  deficiency    Current Outpatient Medications  Medication Sig Dispense Refill   acetaminophen  (TYLENOL ) 325 MG tablet Take 2 tablets (650 mg total) by mouth every 6 (six) hours as needed for mild pain (pain score 1-3) or fever (or Fever >/= 101).     allopurinol  (ZYLOPRIM ) 100 MG tablet Take 0.5 tablets (50 mg total) by mouth daily. 30 tablet 0   aspirin  EC 81 MG tablet Take 1 tablet (81 mg total) by mouth daily. Swallow whole. 30 tablet 12   busPIRone  (BUSPAR ) 15 MG tablet Take 1 tablet (15 mg total) by mouth 3 (three) times daily.  90 tablet 0   carvedilol  (COREG ) 6.25 MG tablet Take 6.25 mg by mouth 2 (two) times daily with a meal.     diclofenac  Sodium (VOLTAREN ) 1 % GEL Apply 2 g topically 4 (four) times daily. 100 g 0   Evolocumab  (REPATHA  SURECLICK) 140 MG/ML SOAJ Inject 140 mg into the skin every 14 (fourteen) days. 6 mL 1   ezetimibe  (ZETIA ) 10 MG tablet Take 1 tablet (10 mg total) by mouth daily. 30 tablet 0   Furosemide  (FUROSCIX ) 80 MG/10ML CTKT Inject 80 mg into the skin as directed. 4 each 3   gabapentin  (NEURONTIN ) 300 MG capsule Take 1 capsule (300 mg total) by mouth 2 (two) times daily. 60 capsule 0   Glucagon  (GVOKE  HYPOPEN 2-PACK) 0.5 MG/0.1ML SOAJ Inject 0.5 mg into the skin daily as needed. 0.2 mL 5   HYDROcodone -acetaminophen  (NORCO) 7.5-325 MG tablet Take 1 tablet by mouth 3 (three) times daily as needed. 90 tablet 0   insulin  aspart (NOVOLOG  FLEXPEN) 100 UNIT/ML FlexPen Inject 2 - 10 Units before meals based on SSI. 15 mL 3   insulin  glargine (LANTUS ) 100 UNIT/ML Solostar Pen Inject 5 Units into the skin daily. Expires 28 days after first use 15 mL 11   Insulin  Pen Needle (BD PEN NEEDLE NANO 2ND GEN) 32G X 4 MM MISC Inject 1 each into the skin in the morning, at noon, in the evening, and at bedtime. 600 each 3   Insulin  Pen Needle 32G X 4 MM MISC Use with Lantus  pen 100 each 0   latanoprost  (XALATAN ) 0.005 % ophthalmic solution Place 1 drop into both eyes at bedtime. 2.5 mL 0   lidocaine  4 % Place 1 patch onto the skin daily. Remove & Discard patch within 12 hours or as directed by MD 12 patch 0   linaclotide  (LINZESS ) 145 MCG CAPS capsule Take 1 capsule (145 mcg total) by mouth daily before breakfast. 30 capsule 0   Menthol -Methyl Salicylate  (ICY HOT EXTRA STRENGTH) 10-30 % CREA Apply 1 Application topically as needed (bilateral hips and knees). 85 g 1   nitroGLYCERIN  (NITROSTAT ) 0.4 MG SL tablet DISSOLVE ONE TABLET UNDER THE TONGUE EVERY 5 MINUTES AS NEEDED FOR CHEST PAIN.  DO NOT EXCEED A TOTAL OF 3  DOSES IN 15 MINUTES 50 tablet 3   OXYGEN  Inhale 2 L/min into the lungs at bedtime.     pantoprazole  (PROTONIX ) 40 MG tablet Take 1 tablet (40 mg total) by mouth daily. 30 tablet 0   ticagrelor  (BRILINTA ) 90 MG TABS tablet Take 1 tablet (90 mg total) by mouth 2 (two) times daily. 60 tablet 0   torsemide  (DEMADEX ) 20 MG tablet Take 3 tablets (60 mg total) by mouth daily. 90 tablet 3   Vitamin D , Ergocalciferol , (DRISDOL ) 1.25 MG (50000 UNIT) CAPS capsule Take 1 capsule (50,000 Units total) by mouth every 7 (seven) days. 5 capsule 0   No current facility-administered medications for this encounter.   Allergies  Allergen Reactions   Naproxen Hives and Rash   Shellfish-Derived Products Hives, Swelling and Other (See Comments)    Angioedema    Strawberry (Diagnostic) Hives   Celecoxib Other (See Comments)    Stomach pain   Doxepin  Hcl Other (See Comments)    Lethargic, sleeping a lot per pt and family   Glucosamine Hives, Swelling and Other (See Comments)    Angioedema    Iron  Hives and Other (See Comments)    IV iron  transfusion - hives - Feb 2022 hospitalization  Patient said she CAN tolerate if given Benadryl  first   Metoclopramide Other (See Comments)    Facial twitching and stuttering   Metolazone  Other (See Comments)    Acute renal failure   Statins Other (See Comments)    Muscle pain   Social History   Socioeconomic History   Marital status: Married    Spouse name: [name]   Number of children: 2   Years of education: 16   Highest education level: Master's degree (e.g., MA, Dana, MEng, MEd, MSW, MBA)  Occupational History   Occupation: on disability/retired early former Engineer, site  Tobacco Use   Smoking status: Former    Current packs/day: 0.00    Types: Cigarettes    Start date:  32    Quit date: 79    Years since quitting: 41.6    Passive exposure: Past   Smokeless tobacco: Never  Vaping Use   Vaping status: Never Used  Substance and Sexual Activity    Alcohol use: Never   Drug use: Never   Sexual activity: Not on file  Other Topics Concern   Not on file  Social History Narrative   Not on file   Social Drivers of Health   Financial Resource Strain: Low Risk  (10/24/2023)   Overall Financial Resource Strain (CARDIA)    Difficulty of Paying Living Expenses: Not hard at all  Food Insecurity: No Food Insecurity (01/21/2024)   Hunger Vital Sign    Worried About Running Out of Food in the Last Year: Never true    Ran Out of Food in the Last Year: Never true  Transportation Needs: No Transportation Needs (01/21/2024)   PRAPARE - Administrator, Civil Service (Medical): No    Lack of Transportation (Non-Medical): No  Physical Activity: Insufficiently Active (10/24/2023)   Exercise Vital Sign    Days of Exercise per Week: 3 days    Minutes of Exercise per Session: 30 min  Stress: Stress Concern Present (10/24/2023)   [employer] of Occupational Health - Occupational Stress Questionnaire    Feeling of Stress : To some extent  Social Connections: Moderately Integrated (12/14/2023)   Social Connection and Isolation Panel    Frequency of Communication with Friends and Family: More than three times a week    Frequency of Social Gatherings with Friends and Family: Three times a week    Attends Religious Services: More than 4 times per year    Active Member of Clubs or Organizations: No    Attends Banker Meetings: Never    Marital Status: Married  Catering manager Violence: Not At Risk (01/23/2024)   Humiliation, Afraid, Rape, and Kick questionnaire    Fear of Current or Ex-Partner: No    Emotionally Abused: No    Physically Abused: No    Sexually Abused: No   Family History  Problem Relation Age of Onset   Breast cancer Mother 3       contralateral breast ca dx 69   Coronary artery disease Mother    Hypertension Mother    Hyperlipidemia Mother    Diabetes type II Mother    Lung cancer Mother 35   Diabetes  Father    Hypertension Father    Mental illness Sister    Bipolar disorder Other    Depression Other    Schizophrenia Other    Cancer Other        MGF's sisters x2; unknown type   BP 118/76   Pulse 62   Wt 96.7 kg (213 lb 3.2 oz)   SpO2 97%   BMI 35.48 kg/m   Wt Readings from Last 3 Encounters:  03/13/24 96.7 kg (213 lb 3.2 oz)  02/14/24 93.4 kg (206 lb)  02/10/24 93.4 kg (206 lb)   PHYSICAL EXAM: General:  NAD. No resp difficulty, arrived in [facility] HEENT: Normal Neck: Supple. No JVD. Cor: Regular rate & rhythm. No rubs, gallops, 2/6 MR Lungs: Clear Abdomen: Soft, nontender, nondistended.  Extremities: No cyanosis, clubbing, rash, trace R ankle edema; s/p L BKA Neuro: Alert & oriented x 3, moves all 4 extremities w/o difficulty. Affect pleasant.  CardioMEMs (personally reviewed): 14, goal 15  ASSESSMENT & PLAN:  1. CAD, H/o NSTEMIs - H/o multiple  PCI with stents to mid LAD and pRCA - NSTEMI (2/22). Cath deferred due to AKI. Treated medically.  - NSTEMI (7/22)-->S/p PCI/DES to mid Cx, balloon angioplasty to OM1  - LHC (2/24) for chest pain--> new 90% stenosis pLAD. Successful PCI of pLAD with DES x 1 overlapping with prior stent.  - LHC (5/24) --> DES to LAD, severe 85% ostial prox RCA will need eventual intervention but renal function precludes.  - LHC (8/24): PTCA/DES x 1 proximal LAD - LHC (12/24): with proximal LAD restenosis. S/p successful balloon angioplasty 07/18/23 - Doing well. No chest pain - Continue ASA + Brilinta . - Continue Coreg . - Continue Repatha  + Zetia  (allergy  statins).  - Now off Imdur   2. Chronic Diastolic Heart Failure.  - Echo (3/22): EF 55-60%, Grade II DD, normal RV - RHC (10/22): mild to moderate elevated filling pressures, large v-waves PCWP tracing - TEE (10/22): EF 55-60%, basal inferior akinesis, severe central MR.>>Underwent TEER>>mod residual MR - Echo (11/22): EF 50-55%, severe LV HK, moderate MR - Echo (12/24): EF 55-60%, LV with no  RWMA, RV normal, mild MR mG 5 mmHg - Stable NYHA II, confounded by BKA but no dyspnea with ADLs or transfers out of WC. Volume ok today on exam and cardiomems - Continue torsemide  60 mg daily + 20 KCL daily. - Continue Furoscix /40 KCL PRN - No SGLT2i with DM1 and recurrent UTIs - BMET today.  3. CKD IV - Prior h/o CIN following LHC - Previous SCr baseline 1.6-1.8 with multiple AKIs over the last year. New baseline ~ 2.0-2.5 - Followed by Dr. Prescilla. Last SCr 2.2 - AKI post cath (5/24), 60 cc contrast used. SCr 2.7>4.25 - AKI post cath (8/24), 80 cc contrast used. SCr peak at 5.19 - BMET today.  4. MR  - Severe central MR on TEE on 10/22.   - S/p TEER 11/22 - two clips placed, one became entangled and had to be deployed. - Moderate residual MR on echo 11/22 - Echo (5/24): post mitral clip x 2 with XTW and NTW the latter entangled in chords no obvious SLD noted mean gradient 5 mmHg. Mild residual MR appears medial to the two clips  - Echo 12/24: Stable moderate MR - TEE 6/25: EF 50-55%, severe MR, mitra-clip stable   5. OSA - Continue CPAP - No change   6. DM I - On insulin  - Recent A1c 9 10/24 - No SGLT2i with DM1 and recurrent UTIs  Doing well! Follow up in 4 months with Dr. Cherrie  Harlene CHRISTELLA Gainer, FNP  11:17 AM

## 2024-03-12 ENCOUNTER — Other Ambulatory Visit: Payer: Self-pay

## 2024-03-12 ENCOUNTER — Telehealth (HOSPITAL_COMMUNITY): Payer: Self-pay

## 2024-03-12 ENCOUNTER — Other Ambulatory Visit (HOSPITAL_COMMUNITY): Payer: Self-pay

## 2024-03-12 NOTE — Telephone Encounter (Signed)
 Called to confirm/remind patient of their appointment at the [facility] on 03/13/24.   Appointment:   [] Confirmed  [] Left mess   [] No answer/No voice mail  [x] VM Full/unable to leave message  [] Phone not in service

## 2024-03-13 ENCOUNTER — Encounter (HOSPITAL_COMMUNITY): Payer: Self-pay

## 2024-03-13 ENCOUNTER — Ambulatory Visit (HOSPITAL_COMMUNITY): Payer: Self-pay | Admitting: Family Medicine

## 2024-03-13 ENCOUNTER — Ambulatory Visit (HOSPITAL_COMMUNITY)
Admission: RE | Admit: 2024-03-13 | Discharge: 2024-03-13 | Disposition: A | Discharge status: Home / Self Care | Source: Ambulatory Visit | Attending: Family Medicine | Admitting: Family Medicine

## 2024-03-13 VITALS — BP 118/76 | HR 62 | Wt 213.2 lb

## 2024-03-13 DIAGNOSIS — Z7982 Long term (current) use of aspirin: Secondary | ICD-10-CM | POA: Diagnosis not present

## 2024-03-13 DIAGNOSIS — Z7902 Long term (current) use of antithrombotics/antiplatelets: Secondary | ICD-10-CM | POA: Insufficient documentation

## 2024-03-13 DIAGNOSIS — I130 Hypertensive heart and chronic kidney disease with heart failure and stage 1 through stage 4 chronic kidney disease, or unspecified chronic kidney disease: Secondary | ICD-10-CM | POA: Insufficient documentation

## 2024-03-13 DIAGNOSIS — Z95818 Presence of other cardiac implants and grafts: Secondary | ICD-10-CM | POA: Diagnosis not present

## 2024-03-13 DIAGNOSIS — I2510 Atherosclerotic heart disease of native coronary artery without angina pectoris: Secondary | ICD-10-CM | POA: Diagnosis not present

## 2024-03-13 DIAGNOSIS — Z794 Long term (current) use of insulin: Secondary | ICD-10-CM | POA: Insufficient documentation

## 2024-03-13 DIAGNOSIS — N184 Chronic kidney disease, stage 4 (severe): Secondary | ICD-10-CM | POA: Insufficient documentation

## 2024-03-13 DIAGNOSIS — Z79899 Other long term (current) drug therapy: Secondary | ICD-10-CM | POA: Insufficient documentation

## 2024-03-13 DIAGNOSIS — I5032 Chronic diastolic (congestive) heart failure: Secondary | ICD-10-CM | POA: Diagnosis not present

## 2024-03-13 DIAGNOSIS — G4733 Obstructive sleep apnea (adult) (pediatric): Secondary | ICD-10-CM | POA: Insufficient documentation

## 2024-03-13 DIAGNOSIS — E1022 Type 1 diabetes mellitus with diabetic chronic kidney disease: Secondary | ICD-10-CM | POA: Diagnosis not present

## 2024-03-13 DIAGNOSIS — Z9889 Other specified postprocedural states: Secondary | ICD-10-CM

## 2024-03-13 DIAGNOSIS — I252 Old myocardial infarction: Secondary | ICD-10-CM | POA: Insufficient documentation

## 2024-03-13 DIAGNOSIS — Z87891 Personal history of nicotine dependence: Secondary | ICD-10-CM | POA: Insufficient documentation

## 2024-03-13 DIAGNOSIS — Z955 Presence of coronary angioplasty implant and graft: Secondary | ICD-10-CM | POA: Insufficient documentation

## 2024-03-13 DIAGNOSIS — Z87440 Personal history of urinary (tract) infections: Secondary | ICD-10-CM | POA: Insufficient documentation

## 2024-03-13 LAB — BASIC METABOLIC PANEL WITH GFR
Anion gap: 11 (ref 5–15)
BUN: 62 mg/dL — ABNORMAL HIGH (ref 8–23)
CO2: 29 mmol/L (ref 22–32)
Calcium: 9.7 mg/dL (ref 8.9–10.3)
Chloride: 99 mmol/L (ref 98–111)
Creatinine, Ser: 1.87 mg/dL — ABNORMAL HIGH (ref 0.44–1.00)
GFR, Estimated: 30 mL/min — ABNORMAL LOW (ref >60)
Glucose, Bld: 100 mg/dL — ABNORMAL HIGH (ref 70–99)
Potassium: 4.3 mmol/L (ref 3.5–5.1)
Sodium: 139 mmol/L (ref 135–145)

## 2024-03-13 MED ORDER — POTASSIUM CHLORIDE CRYS ER 20 MEQ PO TBCR: 20.0000 meq | EXTENDED_RELEASE_TABLET | Freq: Every day | ORAL | Status: DC

## 2024-03-13 NOTE — Patient Instructions (Signed)
 Labs done today, your results will be available in MyChart, we will contact you for abnormal readings.  Follow-Up in: 4 MONTHS WITH DR. CHERRIE PLEASE CALL OUR OFFICE AROUND OCTOBER TO GET SCHEDULED FOR YOUR APPOINTMENT. PHONE NUMBER IS [phone] OPTION 2   At the [facility], you and your health needs are our priority. We have a designated team specialized in the treatment of Heart Failure. This Care Team includes your primary Heart Failure Specialized Cardiologist (physician), Advanced Practice Providers (APPs- Physician Assistants and Nurse Practitioners), and Pharmacist who all work together to provide you with the care you need, when you need it.   You may see any of the following providers on your designated Care Team at your next follow up:  Dr. Toribio CHERRIE Dr. Ezra Shuck Dr. Ria Commander Dr. Odis Brownie Greig Mosses, NP Caffie Shed, [place] [facility] [place], [place] Beckey Coe, NP [place] Lee, NP Tinnie Redman, PharmD   Please be sure to bring in all your medications bottles to every appointment.   Need to Contact Us :  If you have any questions or concerns before your next appointment please send us  a message through [place] or call our office at [phone].    TO LEAVE A MESSAGE FOR THE NURSE SELECT OPTION 2, PLEASE LEAVE A MESSAGE INCLUDING: YOUR NAME DATE OF BIRTH CALL BACK NUMBER REASON FOR CALL**this is important as we prioritize the call backs  YOU WILL RECEIVE A CALL BACK THE SAME DAY AS LONG AS YOU CALL BEFORE 4:00 PM

## 2024-03-17 DIAGNOSIS — F411 Generalized anxiety disorder: Secondary | ICD-10-CM | POA: Diagnosis not present

## 2024-03-24 ENCOUNTER — Other Ambulatory Visit: Payer: Self-pay

## 2024-03-24 ENCOUNTER — Other Ambulatory Visit: Payer: Self-pay | Admitting: Pharmacy Technician

## 2024-03-24 ENCOUNTER — Encounter: Payer: Self-pay | Admitting: Physician Assistant

## 2024-03-24 ENCOUNTER — Ambulatory Visit (INDEPENDENT_AMBULATORY_CARE_PROVIDER_SITE_OTHER): Admitting: Physician Assistant

## 2024-03-24 DIAGNOSIS — Z89512 Acquired absence of left leg below knee: Secondary | ICD-10-CM | POA: Diagnosis not present

## 2024-03-24 NOTE — Progress Notes (Signed)
 Specialty Pharmacy Refill Coordination Note  Dana [name] is a 63 y.o. female contacted today regarding refills of specialty medication(s) Furosemide  (Furoscix )   Patient requested Delivery   Delivery date: 03/26/24   Verified address: [address]   [place] [address]   Medication will be filled on 03/25/24.  Patient sent in refill via WEB. Contacted patient for Furoscix  refill and was successful.  Clean claim $0.00 co-pay.  One-time Research officer, trade union.

## 2024-03-24 NOTE — Progress Notes (Signed)
 Office Visit Note   Patient: Dana [name]           Date of Birth: Jun 09, 1961           MRN: [id number] Visit Date: 03/24/2024              Requested by: Sherre Clapper, MD [address] [place],  [place] [address] PCP: Sherre Clapper, MD  Chief Complaint  Patient presents with   Left Leg - Routine Post Op    Hx left BKA      HPI: Patient is a 63 year old woman who is seen for initial evaluation from her left below-knee amputation. Surgery on June 4. The left BKA feels well and she donned a prosthetic for the first time at [facility].    Assessment & Plan: Visit Diagnoses: No diagnosis found.  Plan: PT prescription given for prosthetic gait training f Follow-Up Instructions: Return in about 4 weeks (around 04/21/2024) for after PT prothectic gait training has been done.   Ortho Exam  Patient is alert, oriented, no adenopathy, well-dressed, normal affect, normal respiratory effort. Well healed and shaped left BKA.  Skin warm without ischemic changes.  Excellent motor of the left knee.      Imaging:   Labs: Lab Results  Component Value Date   HGBA1C 10.2 (H) 11/12/2023   HGBA1C 9.0 (H) 05/14/2023   HGBA1C 8.5 (H) 02/13/2023   ESRSEDRATE 94 (H) 03/23/2020   ESRSEDRATE 85 (H) 11/07/2017   CRP 25 (H) 03/23/2020   LABURIC 6.5 05/14/2023   LABURIC 9.0 (H) 01/19/2022   LABURIC 9.3 (H) 04/25/2021   REPTSTATUS 01/25/2024 FINAL 01/20/2024   CULT  01/20/2024    NO GROWTH 5 DAYS Performed at [facility], [address], [place], [place] [address]    LABORGA KLEBSIELLA PNEUMONIAE (A) 01/03/2024   LABORGA ESCHERICHIA COLI (A) 01/03/2024     Lab Results  Component Value Date   ALBUMIN  3.4 (L) 02/10/2024   ALBUMIN  2.3 (L) 01/25/2024   ALBUMIN  2.3 (L) 01/23/2024    Lab Results  Component Value Date   MG 2.0 01/24/2024   MG 2.0 01/21/2024   MG 1.8 12/25/2023   Lab Results  Component Value Date   VD25OH 15.7 (L) 02/10/2024   VD25OH 22.70 (L)  11/12/2023   VD25OH 25.2 (L) 04/04/2023    No results found for: PREALBUMIN    Latest Ref Rng & Units 02/10/2024   12:11 PM 01/23/2024    2:26 AM 01/22/2024    2:56 AM  CBC EXTENDED  WBC 3.4 - 10.8 x10E3/uL 8.2  8.4  9.0   RBC 3.77 - 5.28 x10E6/uL 2.80  3.45  3.59   Hemoglobin 11.1 - 15.9 g/dL 7.6  9.2  9.6   HCT 65.9 - 46.6 % 26.2  29.8  30.9   Platelets 150 - 450 x10E3/uL 202  320  290   NEUT# 1.4 - 7.0 x10E3/uL 6.4     Lymph# 0.7 - 3.1 x10E3/uL 1.0        There is no height or weight on file to calculate BMI.  Orders:  No orders of the defined types were placed in this encounter.  No orders of the defined types were placed in this encounter.    Procedures: No procedures performed  Clinical Data: No additional findings.  ROS:  All other systems negative, except as noted in the HPI. Review of Systems  Objective: Vital Signs: There were no vitals taken for this visit.  Specialty Comments:  No specialty comments available.  PMFS History: Patient Active Problem List   Diagnosis Date Noted   Chronic heart failure with preserved ejection fraction (HFpEF) (HCC) 02/11/2024   Acute cough 01/26/2024   Allergic rhinitis 01/26/2024   Obesity, class 1 01/22/2024   Acute on chronic systolic CHF (congestive heart failure) (HCC) 01/20/2024   Acute metabolic encephalopathy 01/20/2024   ABLA (acute blood loss anemia) 01/02/2024   Moderate episode of recurrent major depressive disorder (HCC) 01/01/2024   Left below-knee amputee (HCC) 12/27/2023   Status post below-knee amputation of left lower extremity (HCC) 12/20/2023   High pulmonary arterial pressure (HCC) 12/17/2023   Severe protein-calorie malnutrition (HCC) 12/15/2023   Severe sepsis secondary to postoperative left ankle infection and MRSA bacteremia 12/14/2023   MRSA bacteremia - end of therapy with Daptomycin  12/28/2023. 12/14/2023   Depression with anxiety 12/14/2023   Acute pyelonephritis 12/14/2023    Trimalleolar fracture of ankle, closed 11/11/2023   Situational insomnia 10/04/2023   Acute on chronic combined systolic and diastolic CHF (congestive heart failure) (HCC) 09/16/2023   SOB (shortness of breath) 08/27/2023   Hospital discharge follow-up 07/29/2023   Coronary artery disease involving native coronary artery of native heart with angina pectoris (HCC) 07/20/2023   Presence of heart assist device (HCC) 07/13/2023   Moderate recurrent major depression (HCC) 07/13/2023   History of amputation of hallux (HCC) 05/24/2023   Bilateral hand pain 05/14/2023   Contrast-induced nephropathy 02/20/2023   Unstable angina (HCC) 09/07/2022   Chronic idiopathic constipation 09/01/2022   Hyperphosphatemia 01/09/2022   Genetic testing 11/07/2021   Family history of breast cancer 10/23/2021   GAD (generalized anxiety disorder) 07/11/2021   Mixed hyperlipidemia 07/11/2021   S/P mitral valve clip implantation 05/18/2021   History of cardiac arrest 02/06/2021   Impaired mobility and ADLs 01/20/2021   Left-sided weakness 01/20/2021   Drug-induced myopathy 01/03/2021   Secondary hyperparathyroidism of renal origin (HCC) 11/18/2020   Vitamin D  insufficiency 11/18/2020   Uncontrolled type 1 diabetes mellitus with hyperglycemia, with long-term current use of insulin  (HCC) 10/13/2020   CKD stage 4 due to type 1 diabetes mellitus (HCC) - baseline 2.2 - 2.4 10/13/2020   NSTEMI (non-ST elevated myocardial infarction) (HCC) 09/03/2020   Contrast dye induced nephropathy, possible 09/03/2020   OSA on CPAP 09/03/2020   Insulin  dependent type 2 diabetes mellitus (HCC) 09/03/2020   Need for COVID-19 vaccine 06/28/2020   Chronic gout without tophus 03/23/2020   Pain in both hands 03/23/2020   Hypomagnesemia 12/21/2019   Lumbar back pain 10/05/2019   Hypertensive heart and renal disease with congestive heart failure (HCC) 09/18/2019   Chronic combined systolic (congestive) and diastolic (congestive) heart  failure (HCC) 11/07/2017   Class 2 severe obesity due to excess calories with serious comorbidity and body mass index (BMI) of 39.0 to 39.9 in adult (HCC) 11/07/2017   Hyperlipidemia LDL goal <70 12/14/2016   Anemia in CKD (chronic kidney disease) 03/23/2016   Chronic pain syndrome 03/23/2016   Coronary artery disease of native artery of native heart with stable angina pectoris (HCC) 03/23/2016   GERD without esophagitis 03/23/2016   On home oxygen  therapy 03/23/2016   S/P ablation of atrial fibrillation 10/04/2015   Diabetic polyneuropathy associated with type 2 diabetes mellitus (HCC) 04/02/2014   Past Medical History:  Diagnosis Date   Acquired absence of left great toe (HCC)    Acquired absence of other left toe(s) (HCC)    ACS (acute coronary syndrome) (HCC)  Acute bronchitis due to COVID-19 virus 03/03/2023   Acute on chronic combined systolic and diastolic CHF (congestive heart failure) (HCC) 09/16/2023   Acute respiratory failure with hypoxia (HCC) 07/20/2023   Anemia    Anoxic brain injury (HCC)    Anxiety    CAD (coronary artery disease)    DES mLAD 04/07/15; DES dRCA & DES pPDA 08/30/16; DES mCX & PTCA OM1 09/29/20   Charcot's joint of foot in type 2 diabetes mellitus (HCC) 12/15/2023   Chronic diastolic (congestive) heart failure (HCC)    Chronic pain    Chronic systolic (congestive) heart failure (HCC)    CKD (chronic kidney disease)    Contrast dye induced nephropathy, possible 09/03/2020   COPD (chronic obstructive pulmonary disease) (HCC)    COVID-19 virus infection 03/03/2023   Demand ischemia (HCC)    Diabetes (HCC)type 1    Diastolic CHF (HCC)    Dyspnea    Encounter for screening mammogram for malignant neoplasm of breast 09/16/2023   Family history of breast cancer 10/23/2021   Gastro-esophageal reflux disease without esophagitis    Hyperlipidemia    Hypertension    Hypertensive heart disease with heart failure (HCC)    Hypoxemia    Idiopathic gout,  multiple sites    Leukocytosis 03/29/2022   Localized edema    Low back pain    Mitral regurgitation    Mitral regurgitation    Mixed hyperlipidemia    Mixed incontinence    Morbid (severe) obesity due to excess calories (HCC)    Neuromuscular disorder (HCC)    Neuropathy    Non-ST elevation (NSTEMI) myocardial infarction (HCC)    08/29/20, 09/29/20   OSA (obstructive sleep apnea) 09/03/2020   Other specified hypothyroidism    PAF (paroxysmal atrial fibrillation) (HCC)    s/p pulmonary vein isolation by cryoablation 10/04/15 Dr. Meldon in [facility]   PEA (Pulseless electrical activity) [facility])    ~ 01/13/21 at [facility] during admission DKA, volume overload, possible CAP, hypoxia s/p ACLS with ROSC ~ 10-15   Pneumonia    Primary insomnia    Rheumatoid arthritis (HCC)    Stroke (HCC)    Thyroid  disease    Type 1 diabetes mellitus (HCC)    URI with cough and congestion 08/22/2023   Vitamin D  deficiency     Family History  Problem Relation Age of Onset   Breast cancer Mother 62       contralateral breast ca dx 74   Coronary artery disease Mother    Hypertension Mother    Hyperlipidemia Mother    Diabetes type II Mother    Lung cancer Mother 57   Diabetes Father    Hypertension Father    Mental illness Sister    Bipolar disorder Other    Depression Other    Schizophrenia Other    Cancer Other        MGF's sisters x2; unknown type    Past Surgical History:  Procedure Laterality Date   ABDOMINAL HYSTERECTOMY     Partial- Still has both ovaries   AMPUTATION Left 12/18/2023   Procedure: AMPUTATION BELOW KNEE;  Surgeon: Harden Jerona GAILS, MD;  Location: [facility] OR;  Service: Orthopedics;  Laterality: Left;   ANKLE FUSION Left 11/13/2023   Procedure: HINDFOOT FUSION FIXATION OF ANKLE;  Surgeon: Kendal Franky SQUIBB, MD;  Location: [facility] OR;  Service: Orthopedics;  Laterality: Left;   CARDIOVASCULAR STRESS TEST  08/13/2014   Nuclear; Normal   CARPAL TUNNEL RELEASE  CATARACT EXTRACTION,  BILATERAL     CESAREAN SECTION     CHOLECYSTECTOMY     CORONARY ANGIOPLASTY WITH STENT PLACEMENT     3 blockages/ 1 stent   CORONARY BALLOON ANGIOPLASTY N/A 07/18/2023   Procedure: CORONARY BALLOON ANGIOPLASTY;  Surgeon: Verlin Lonni BIRCH, MD;  Location: [facility] INVASIVE CV LAB;  Service: Cardiovascular;  Laterality: N/A;   CORONARY PRESSURE/FFR STUDY N/A 09/07/2022   Procedure: INTRAVASCULAR PRESSURE WIRE/FFR STUDY;  Surgeon: Swaziland, Peter M, MD;  Location: [facility] INVASIVE CV LAB;  Service: Cardiovascular;  Laterality: N/A;   CORONARY STENT INTERVENTION N/A 09/29/2020   Procedure: CORONARY STENT INTERVENTION;  Surgeon: Wonda Sharper, MD;  Location: [facility] INVASIVE CV LAB;  Service: Cardiovascular;  Laterality: N/A;  CFX   CORONARY STENT INTERVENTION N/A 09/07/2022   Procedure: CORONARY STENT INTERVENTION;  Surgeon: Swaziland, Peter M, MD;  Location: [facility] INVASIVE CV LAB;  Service: Cardiovascular;  Laterality: N/A;   CORONARY STENT INTERVENTION N/A 02/13/2023   Procedure: CORONARY STENT INTERVENTION;  Surgeon: Verlin Lonni BIRCH, MD;  Location: [facility] INVAS[facility] CV LAB;  Service: Cardiovascular;  Laterality: N/A;  LAD   CORONARY ULTRASOUND/IVUS N/A 02/13/2023   Procedure: Coronary Ultrasound/IVUS;  Surgeon: Verlin Lonni BIRCH, MD;  Location: [facility] INVAS[facility] CV LAB;  Service: Cardiovascular;  Laterality: N/A;   CORONARY/GRAFT ACUTE MI REVASCULARIZATION N/A 12/08/2022   Procedure: Coronary/Graft Acute MI Revascularization;  Surgeon: Anner Alm ORN, MD;  Location: [facility] INVASIVE CV LAB;  Service: Cardiovascular;  Laterality: N/A;   EYE SURGERY     LEFT HEART CATH AND CORONARY ANGIOGRAPHY N/A 09/29/2020   Procedure: LEFT HEART CATH AND CORONARY ANGIOGRAPHY;  Surgeon: Wonda Sharper, MD;  Location: [facility] INVASIVE CV LAB;  Service: Cardiovascular;  Laterality: N/A;   LEFT HEART CATH AND CORONARY ANGIOGRAPHY N/A 09/07/2022   Procedure: LEFT HEART CATH AND CORONARY ANGIOGRAPHY;  Surgeon: Swaziland, Peter M, MD;   Location: [facility] INVASIVE CV LAB;  Service: Cardiovascular;  Laterality: N/A;   LEFT HEART CATH AND CORONARY ANGIOGRAPHY N/A 12/08/2022   Procedure: LEFT HEART CATH AND CORONARY ANGIOGRAPHY;  Surgeon: Anner Alm ORN, MD;  Location: [facility] INVASIVE CV LAB;  Service: Cardiovascular;  Laterality: N/A;   LEFT HEART CATH AND CORONARY ANGIOGRAPHY N/A 02/13/2023   Procedure: LEFT HEART CATH AND CORONARY ANGIOGRAPHY;  Surgeon: Verlin Lonni BIRCH, MD;  Location: [facility]VAS[facility] CV LAB;  Service: Cardiovascular;  Laterality: N/A;   LEFT HEART CATH AND CORONARY ANGIOGRAPHY N/A 07/16/2023   Procedure: LEFT HEART CATH AND CORONARY ANGIOGRAPHY;  Surgeon: Verlin Lonni BIRCH, MD;  Location: [facility] INVAS[facility] CV LAB;  Service: Cardiovascular;  Laterality: N/A;   RIGHT HEART CATH N/A 04/27/2021   Procedure: RIGHT HEART CATH;  Surgeon: Cherrie Toribio SAUNDERS, MD;  Location: [facility] INVASIVE CV LAB;  Service: Cardiovascular;  Laterality: N/A;   RIGHT/LEFT HEART CATH AND CORONARY ANGIOGRAPHY N/A 01/07/2017   Procedure: Right/Left Heart Cath and Coronary Angiography;  Surgeon: Rolan Ezra RAMAN, MD;  Location: [facility] INVASIVE CV LAB;  Service: Cardiovascular;  Laterality: N/A;   ROTATOR CUFF REPAIR     TEE WITHOUT CARDIOVERSION N/A 04/28/2021   Procedure: TRANSESOPHAGEAL ECHOCARDIOGRAM (TEE);  Surgeon: Cherrie Toribio SAUNDERS, MD;  Location: [facility] ENDOSCOPY;  Service: Cardiovascular;  Laterality: N/A;   TEE WITHOUT CARDIOVERSION N/A 05/18/2021   Procedure: TRANSESOPHAGEAL ECHOCARDIOGRAM (TEE);  Surgeon: Thukkani, Arun K, MD;  Location: [facility] INVASIVE CV LAB;  Service: Cardiovascular;  Laterality: N/A;   TOE AMPUTATION Left    TRANSCATHETER MITRAL EDGE TO EDGE REPAIR N/A 05/18/2021   Procedure: MITRAL VALVE  REPAIR;  Surgeon: Thukkani, Arun K, MD;  Location: [facility] INVASIVE CV LAB;  Service: Cardiovascular;  Laterality: N/A;   TRANSESOPHAGEAL ECHOCARDIOGRAM (CATH LAB) N/A 12/23/2023   Procedure: TRANSESOPHAGEAL ECHOCARDIOGRAM;  Surgeon: Kate Lonni CROME, MD;  Location: [facility] INVASIVE CV LAB;  Service: Cardiovascular;  Laterality: N/A;   US  ECHOCARDIOGRAPHY  05/2017   Normal   Social History   Occupational History   Occupation: on disability/retired early former Engineer, site  Tobacco Use   Smoking status: Former    Current packs/day: 0.00    Types: Cigarettes    Start date: 19    Quit date: 1984    Years since quitting: 41.7    Passive exposure: Past   Smokeless tobacco: Never  Vaping Use   Vaping status: Never Used  Substance and Sexual Activity   Alcohol use: Never   Drug use: Never   Sexual activity: Not on file

## 2024-03-25 ENCOUNTER — Encounter: Payer: Self-pay | Admitting: Family

## 2024-03-25 ENCOUNTER — Other Ambulatory Visit: Payer: Self-pay

## 2024-03-25 ENCOUNTER — Other Ambulatory Visit (HOSPITAL_COMMUNITY): Payer: Self-pay

## 2024-03-31 DIAGNOSIS — F411 Generalized anxiety disorder: Secondary | ICD-10-CM | POA: Diagnosis not present

## 2024-04-01 ENCOUNTER — Encounter: Payer: Self-pay | Admitting: Family Medicine

## 2024-04-01 DIAGNOSIS — I10 Essential (primary) hypertension: Secondary | ICD-10-CM | POA: Diagnosis not present

## 2024-04-01 DIAGNOSIS — E1022 Type 1 diabetes mellitus with diabetic chronic kidney disease: Secondary | ICD-10-CM | POA: Diagnosis not present

## 2024-04-01 DIAGNOSIS — N1832 Chronic kidney disease, stage 3b: Secondary | ICD-10-CM | POA: Diagnosis not present

## 2024-04-01 DIAGNOSIS — D631 Anemia in chronic kidney disease: Secondary | ICD-10-CM | POA: Diagnosis not present

## 2024-04-02 ENCOUNTER — Other Ambulatory Visit: Payer: Self-pay | Admitting: Family Medicine

## 2024-04-02 ENCOUNTER — Other Ambulatory Visit (HOSPITAL_COMMUNITY): Payer: Self-pay

## 2024-04-02 LAB — LAB REPORT - SCANNED
Albumin, Urine POC: 451.9
Creatinine, POC: 42.5 mg/dL
EGFR: 27
Microalb Creat Ratio: 1063

## 2024-04-02 MED ORDER — INSULIN GLARGINE 100 UNIT/ML SOLOSTAR PEN
5.0000 [IU] | PEN_INJECTOR | Freq: Every day | SUBCUTANEOUS | 0 refills | Status: DC
  Filled 2024-04-02: qty 3, 28d supply, fill #0
  Filled 2024-05-07: qty 3, 28d supply, fill #1
  Filled 2024-05-24 – 2024-06-22 (×3): qty 3, 28d supply, fill #2

## 2024-04-02 MED ORDER — HYDROCODONE-ACETAMINOPHEN 7.5-325 MG PO TABS
1.0000 | ORAL_TABLET | Freq: Three times a day (TID) | ORAL | 0 refills | Status: DC | PRN
  Filled 2024-04-02: qty 90, 30d supply, fill #0

## 2024-04-03 ENCOUNTER — Other Ambulatory Visit: Payer: Self-pay

## 2024-04-08 ENCOUNTER — Other Ambulatory Visit (HOSPITAL_COMMUNITY): Payer: Self-pay

## 2024-04-09 DIAGNOSIS — S82853A Displaced trimalleolar fracture of unspecified lower leg, initial encounter for closed fracture: Secondary | ICD-10-CM | POA: Diagnosis not present

## 2024-04-09 DIAGNOSIS — Z89512 Acquired absence of left leg below knee: Secondary | ICD-10-CM | POA: Diagnosis not present

## 2024-04-10 ENCOUNTER — Other Ambulatory Visit: Payer: Self-pay

## 2024-04-10 ENCOUNTER — Other Ambulatory Visit (HOSPITAL_COMMUNITY): Payer: Self-pay

## 2024-04-10 DIAGNOSIS — Z89512 Acquired absence of left leg below knee: Secondary | ICD-10-CM | POA: Diagnosis not present

## 2024-04-10 DIAGNOSIS — S82853A Displaced trimalleolar fracture of unspecified lower leg, initial encounter for closed fracture: Secondary | ICD-10-CM | POA: Diagnosis not present

## 2024-04-10 MED ORDER — CEPHALEXIN 250 MG PO CAPS
250.0000 mg | ORAL_CAPSULE | Freq: Two times a day (BID) | ORAL | 0 refills | Status: DC
  Filled 2024-04-10: qty 14, 7d supply, fill #0

## 2024-04-14 DIAGNOSIS — F411 Generalized anxiety disorder: Secondary | ICD-10-CM | POA: Diagnosis not present

## 2024-04-15 DIAGNOSIS — Z89512 Acquired absence of left leg below knee: Secondary | ICD-10-CM | POA: Diagnosis not present

## 2024-04-15 DIAGNOSIS — M79605 Pain in left leg: Secondary | ICD-10-CM | POA: Diagnosis not present

## 2024-04-15 DIAGNOSIS — R2681 Unsteadiness on feet: Secondary | ICD-10-CM | POA: Diagnosis not present

## 2024-04-16 ENCOUNTER — Encounter: Payer: Self-pay | Admitting: Family

## 2024-04-16 ENCOUNTER — Ambulatory Visit: Admitting: Physician Assistant

## 2024-04-16 ENCOUNTER — Encounter: Payer: Self-pay | Admitting: Physician Assistant

## 2024-04-16 DIAGNOSIS — Z89512 Acquired absence of left leg below knee: Secondary | ICD-10-CM | POA: Diagnosis not present

## 2024-04-16 NOTE — Progress Notes (Signed)
 Office Visit Note   Patient: Dana [name]           Date of Birth: [birth year]           MRN: [id number] Visit Date: 04/16/2024              Requested by: Sherre Clapper, MD [address] [place],  [place] [address] PCP: Sherre Clapper, MD  Chief Complaint  Patient presents with   Left Leg - Follow-up    12/2023 left BKA       HPI: Patient is a 63 year old woman who is seen for initial evaluation from her left below-knee amputation. Surgery on June 4.  On her last visit she has received her left BKA prothesis and we ordered PT gait training which she just started it.  The stump was well healed at her last visit.   Assessment & Plan: Visit Diagnoses: No diagnosis found.  Plan: Black stump sock then silicon liner covered by white sock then prothesis.  She is on Keflex  and will finish this for a UTI prescribed by her nephrologist.    Follow-Up Instructions: Return in about 4 weeks (around 05/14/2024).   Ortho Exam  Patient is alert, oriented, no adenopathy, well-dressed, normal affect, normal respiratory effort. Stump appears well healed.  The is a pin point place lateral stump incision site that she has drainage after a shower.  She expressed yellow drainage out yesterday and now there is no drainage in the last 24 hours.  It appears that she has a stitch abscess.  No cellulitis or edema.  Stump appears viable.      Imaging:      Labs: Lab Results  Component Value Date   HGBA1C 10.2 (H) 11/12/2023   HGBA1C 9.0 (H) 05/14/2023   HGBA1C 8.5 (H) 02/13/2023   ESRSEDRATE 94 (H) 03/23/2020   ESRSEDRATE 85 (H) 11/07/2017   CRP 25 (H) 03/23/2020   LABURIC 6.5 05/14/2023   LABURIC 9.0 (H) 01/19/2022   LABURIC 9.3 (H) 04/25/2021   REPTSTATUS 01/25/2024 FINAL 01/20/2024   CULT  01/20/2024    NO GROWTH 5 DAYS Performed at [facility], [address] [place], [place] [address]    LABORGA KLEBSIELLA PNEUMONIAE (A) 01/03/2024   LABORGA ESCHERICHIA COLI (A)  01/03/2024     Lab Results  Component Value Date   ALBUMIN  3.4 (L) 02/10/2024   ALBUMIN  2.3 (L) 01/25/2024   ALBUMIN  2.3 (L) 01/23/2024    Lab Results  Component Value Date   MG 2.0 01/24/2024   MG 2.0 01/21/2024   MG 1.8 12/25/2023   Lab Results  Component Value Date   VD25OH 15.7 (L) 02/10/2024   VD25OH 22.70 (L) 11/12/2023   VD25OH 25.2 (L) 04/04/2023    No results found for: PREALBUMIN    Latest Ref Rng & Units 02/10/2024   12:11 PM 01/23/2024    2:26 AM 01/22/2024    2:56 AM  CBC EXTENDED  WBC 3.4 - 10.8 x10E3/uL 8.2  8.4  9.0   RBC 3.77 - 5.28 x10E6/uL 2.80  3.45  3.59   Hemoglobin 11.1 - 15.9 g/dL 7.6  9.2  9.6   HCT 65.9 - 46.6 % 26.2  29.8  30.9   Platelets 150 - 450 x10E3/uL 202  320  290   NEUT# 1.4 - 7.0 x10E3/uL 6.4     Lymph# 0.7 - 3.1 x10E3/uL 1.0        There is no height or weight on  file to calculate BMI.  Orders:  No orders of the defined types were placed in this encounter.  No orders of the defined types were placed in this encounter.    Procedures: No procedures performed  Clinical Data: No additional findings.  ROS:  All other systems negative, except as noted in the HPI. Review of Systems  Objective: Vital Signs: There were no vitals taken for this visit.  Specialty Comments:  No specialty comments available.  PMFS History: Patient Active Problem List   Diagnosis Date Noted   Chronic heart failure with preserved ejection fraction (HFpEF) (HCC) 02/11/2024   Acute cough 01/26/2024   Allergic rhinitis 01/26/2024   Obesity, class 1 01/22/2024   Acute on chronic systolic CHF (congestive heart failure) (HCC) 01/20/2024   Acute metabolic encephalopathy 01/20/2024   ABLA (acute blood loss anemia) 01/02/2024   Moderate episode of recurrent major depressive disorder (HCC) 01/01/2024   Left below-knee amputee (HCC) 12/27/2023   Status post below-knee amputation of left lower extremity (HCC) 12/20/2023   High pulmonary arterial  pressure (HCC) 12/17/2023   Severe protein-calorie malnutrition 12/15/2023   Severe sepsis secondary to postoperative left ankle infection and MRSA bacteremia 12/14/2023   MRSA bacteremia - end of therapy with Daptomycin  12/28/2023. 12/14/2023   Depression with anxiety 12/14/2023   Acute pyelonephritis 12/14/2023   Trimalleolar fracture of ankle, closed 11/11/2023   Situational insomnia 10/04/2023   Acute on chronic combined systolic and diastolic CHF (congestive heart failure) (HCC) 09/16/2023   SOB (shortness of breath) 08/27/2023   Hospital discharge follow-up 07/29/2023   Coronary artery disease involving native coronary artery of native heart with angina pectoris 07/20/2023   Presence of heart assist device (HCC) 07/13/2023   Moderate recurrent major depression (HCC) 07/13/2023   History of amputation of hallux 05/24/2023   Bilateral hand pain 05/14/2023   Contrast-induced nephropathy 02/20/2023   Unstable angina (HCC) 09/07/2022   Chronic idiopathic constipation 09/01/2022   Hyperphosphatemia 01/09/2022   Genetic testing 11/07/2021   Family history of breast cancer 10/23/2021   GAD (generalized anxiety disorder) 07/11/2021   Mixed hyperlipidemia 07/11/2021   S/P mitral valve clip implantation 05/18/2021   History of cardiac arrest 02/06/2021   Impaired mobility and ADLs 01/20/2021   Left-sided weakness 01/20/2021   Drug-induced myopathy 01/03/2021   Secondary hyperparathyroidism of renal origin 11/18/2020   Vitamin D  insufficiency 11/18/2020   Uncontrolled type 1 diabetes mellitus with hyperglycemia, with long-term current use of insulin  (HCC) 10/13/2020   CKD stage 4 due to type 1 diabetes mellitus (HCC) - baseline 2.2 - 2.4 10/13/2020   NSTEMI (non-ST elevated myocardial infarction) (HCC) 09/03/2020   Contrast dye induced nephropathy, possible 09/03/2020   OSA on CPAP 09/03/2020   Insulin  dependent type 2 diabetes mellitus (HCC) 09/03/2020   Need for COVID-19 vaccine  06/28/2020   Chronic gout without tophus 03/23/2020   Pain in both hands 03/23/2020   Hypomagnesemia 12/21/2019   Lumbar back pain 10/05/2019   Hypertensive heart and renal disease with congestive heart failure (HCC) 09/18/2019   Chronic combined systolic (congestive) and diastolic (congestive) heart failure (HCC) 11/07/2017   Class 2 severe obesity due to excess calories with serious comorbidity and body mass index (BMI) of 39.0 to 39.9 in adult 11/07/2017   Hyperlipidemia LDL goal <70 12/14/2016   Anemia in CKD (chronic kidney disease) 03/23/2016   Chronic pain syndrome 03/23/2016   Coronary artery disease of native artery of native heart with stable angina pectoris 03/23/2016   GERD without  esophagitis 03/23/2016   On home oxygen  therapy 03/23/2016   S/P ablation of atrial fibrillation 10/04/2015   Diabetic polyneuropathy associated with type 2 diabetes mellitus (HCC) 04/02/2014   Past Medical History:  Diagnosis Date   Acquired absence of left great toe    Acquired absence of other left toe(s)    ACS (acute coronary syndrome) (HCC)    Acute bronchitis due to COVID-19 virus 03/03/2023   Acute on chronic combined systolic and diastolic CHF (congestive heart failure) (HCC) 09/16/2023   Acute respiratory failure with hypoxia (HCC) 07/20/2023   Anemia    Anoxic brain injury (HCC)    Anxiety    CAD (coronary artery disease)    DES mLAD 04/07/15; DES dRCA & DES pPDA 08/30/16; DES mCX & PTCA OM1 09/29/20   Charcot's joint of foot in type 2 diabetes mellitus (HCC) 12/15/2023   Chronic diastolic (congestive) heart failure (HCC)    Chronic pain    Chronic systolic (congestive) heart failure (HCC)    CKD (chronic kidney disease)    Contrast dye induced nephropathy, possible 09/03/2020   COPD (chronic obstructive pulmonary disease) (HCC)    COVID-19 virus infection 03/03/2023   Demand ischemia (HCC)    Diabetes (HCC)type 1    Diastolic CHF (HCC)    Dyspnea    Encounter for screening  mammogram for malignant neoplasm of breast 09/16/2023   Family history of breast cancer 10/23/2021   Gastro-esophageal reflux disease without esophagitis    Hyperlipidemia    Hypertension    Hypertensive heart disease with heart failure (HCC)    Hypoxemia    Idiopathic gout, multiple sites    Leukocytosis 03/29/2022   Localized edema    Low back pain    Mitral regurgitation    Mitral regurgitation    Mixed hyperlipidemia    Mixed incontinence    Morbid (severe) obesity due to excess calories (HCC)    Neuromuscular disorder (HCC)    Neuropathy    Non-ST elevation (NSTEMI) myocardial infarction (HCC)    08/29/20, 09/29/20   OSA (obstructive sleep apnea) 09/03/2020   Other specified hypothyroidism    PAF (paroxysmal atrial fibrillation) (HCC)    s/p pulmonary vein isolation by cryoablation 10/04/15 Dr. Meldon in [facility]   PEA (Pulseless electrical activity) [facility])    ~ 01/13/21 at [facility] during admission DKA, volume overload, possible CAP, hypoxia s/p ACLS with ROSC ~ 10-15   Pneumonia    Primary insomnia    Rheumatoid arthritis (HCC)    Stroke (HCC)    Thyroid  disease    Type 1 diabetes mellitus (HCC)    URI with cough and congestion 08/22/2023   Vitamin D  deficiency     Family History  Problem Relation Age of Onset   Breast cancer Mother 56       contralateral breast ca dx 32   Coronary artery disease Mother    Hypertension Mother    Hyperlipidemia Mother    Diabetes type II Mother    Lung cancer Mother 26   Diabetes Father    Hypertension Father    Mental illness Sister    Bipolar disorder Other    Depression Other    Schizophrenia Other    Cancer Other        MGF's sisters x2; unknown type    Past Surgical History:  Procedure Laterality Date   ABDOMINAL HYSTERECTOMY     Partial- Still has both ovaries   AMPUTATION Left 12/18/2023   Procedure: AMPUTATION BELOW  KNEE;  Surgeon: Harden Jerona GAILS, MD;  Location: [facility] OR;  Service: Orthopedics;  Laterality: Left;    ANKLE FUSION Left 11/13/2023   Procedure: HINDFOOT FUSION FIXATION OF ANKLE;  Surgeon: Kendal Franky SQUIBB, MD;  Location: [facility] OR;  Service: Orthopedics;  Laterality: Left;   CARDIOVASCULAR STRESS TEST  08/13/2014   Nuclear; Normal   CARPAL TUNNEL RELEASE     CATARACT EXTRACTION, BILATERAL     CESAREAN SECTION     CHOLECYSTECTOMY     CORONARY ANGIOPLASTY WITH STENT PLACEMENT     3 blockages/ 1 stent   CORONARY BALLOON ANGIOPLASTY N/A 07/18/2023   Procedure: CORONARY BALLOON ANGIOPLASTY;  Surgeon: Verlin Lonni BIRCH, MD;  Location: [facility] INVASIVE CV LAB;  Service: Cardiovascular;  Laterality: N/A;   CORONARY PRESSURE/FFR STUDY N/A 09/07/2022   Procedure: INTRAVASCULAR PRESSURE WIRE/FFR STUDY;  Surgeon: Swaziland, Peter M, MD;  Location: [facility] INVASIVE CV LAB;  Service: Cardiovascular;  Laterality: N/A;   CORONARY STENT INTERVENTION N/A 09/29/2020   Procedure: CORONARY STENT INTERVENTION;  Surgeon: Wonda Sharper, MD;  Location: [facility] INVASIVE CV LAB;  Service: Cardiovascular;  Laterality: N/A;  CFX   CORONARY STENT INTERVENTION N/A 09/07/2022   Procedure: CORONARY STENT INTERVENTION;  Surgeon: Swaziland, Peter M, MD;  Location: [facility] INVASIVE CV LAB;  Service: Cardiovascular;  Laterality: N/A;   CORONARY STENT INTERVENTION N/A 02/13/2023   Procedure: CORONARY STENT INTERVENTION;  Surgeon: Verlin Lonni BIRCH, MD;  Location: [facility] INVASIVE CV LAB;  Service: Cardiovascular;  Laterality: N/A;  LAD   CORONARY ULTRASOUND/IVUS N/A 02/13/2023   Procedure: Coronary Ultrasound/IVUS;  Surgeon: Verlin Lonni BIRCH, MD;  Location: [facility] INVASIVE CV LAB;  Service: Cardiovascular;  Laterality: N/A;   CORONARY/GRAFT ACUTE MI REVASCULARIZATION N/A 12/08/2022   Procedure: Coronary/Graft Acute MI Revascularization;  Surgeon: Anner Alm ORN, MD;  Location: [facility] INVASIVE CV LAB;  Service: Cardiovascular;  Laterality: N/A;   EYE SURGERY     LEFT HEART CATH AND CORONARY ANGIOGRAPHY N/A 09/29/2020   Procedure: LEFT HEART CATH  AND CORONARY ANGIOGRAPHY;  Surgeon: Wonda Sharper, MD;  Location: [facility] INVASIVE CV LAB;  Service: Cardiovascular;  Laterality: N/A;   LEFT HEART CATH AND CORONARY ANGIOGRAPHY N/A 09/07/2022   Procedure: LEFT HEART CATH AND CORONARY ANGIOGRAPHY;  Surgeon: Swaziland, Peter M, MD;  Location: [facility] INVASIVE CV LAB;  Service: Cardiovascular;  Laterality: N/A;   LEFT HEART CATH AND CORONARY ANGIOGRAPHY N/A 12/08/2022   Procedure: LEFT HEART CATH AND CORONARY ANGIOGRAPHY;  Surgeon: Anner Alm ORN, MD;  Location: [facility] INVASIVE CV LAB;  Service: Cardiovascular;  Laterality: N/A;   LEFT HEART CATH AND CORONARY ANGIOGRAPHY N/A 02/13/2023   Procedure: LEFT HEART CATH AND CORONARY ANGIOGRAPHY;  Surgeon: Verlin Lonni BIRCH, MD;  Location: [facility] INVASIVE CV LAB;  Service: Cardiovascular;  Laterality: N/A;   LEFT HEART CATH AND CORONARY ANGIOGRAPHY N/A 07/16/2023   Procedure: LEFT HEART CATH AND CORONARY ANGIOGRAPHY;  Surgeon: Verlin Lonni BIRCH, MD;  Location: [facility] INVASIVE CV LAB;  Service: Cardiovascular;  Laterality: N/A;   RIGHT HEART CATH N/A 04/27/2021   Procedure: RIGHT HEART CATH;  Surgeon: Cherrie Toribio SAUNDERS, MD;  Location: [facility] INVASIVE CV LAB;  Service: Cardiovascular;  Laterality: N/A;   RIGHT/LEFT HEART CATH AND CORONARY ANGIOGRAPHY N/A 01/07/2017   Procedure: Right/Left Heart Cath and Coronary Angiography;  Surgeon: Rolan Ezra RAMAN, MD;  Location: [facility] INVASIVE CV LAB;  Service: Cardiovascular;  Laterality: N/A;   ROTATOR CUFF REPAIR     TEE WITHOUT CARDIOVERSION N/A 04/28/2021   Procedure: TRANSESOPHAGEAL ECHOCARDIOGRAM (TEE);  Surgeon: Cherrie Toribio SAUNDERS, MD;  Location: [facility] ENDOSCOPY;  Service: Cardiovascular;  Laterality: N/A;   TEE WITHOUT CARDIOVERSION N/A 05/18/2021   Procedure: TRANSESOPHAGEAL ECHOCARDIOGRAM (TEE);  Surgeon: Thukkani, Arun K, MD;  Location: [facility] INVASIVE CV LAB;  Service: Cardiovascular;  Laterality: N/A;   TOE AMPUTATION Left    TRANSCATHETER MITRAL EDGE TO EDGE REPAIR N/A  05/18/2021   Procedure: MITRAL VALVE REPAIR;  Surgeon: Wendel Lurena POUR, MD;  Location: [facility] INVASIVE CV LAB;  Service: Cardiovascular;  Laterality: N/A;   TRANSESOPHAGEAL ECHOCARDIOGRAM (CATH LAB) N/A 12/23/2023   Procedure: TRANSESOPHAGEAL ECHOCARDIOGRAM;  Surgeon: Kate Lonni CROME, MD;  Location: [facility] INVASIVE CV LAB;  Service: Cardiovascular;  Laterality: N/A;   US  ECHOCARDIOGRAPHY  05/2017   Normal   Social History   Occupational History   Occupation: on disability/retired early former Engineer, site  Tobacco Use   Smoking status: Former    Current packs/day: 0.00    Types: Cigarettes    Start date: 23    Quit date: 1984    Years since quitting: 41.7    Passive exposure: Past   Smokeless tobacco: Never  Vaping Use   Vaping status: Never Used  Substance and Sexual Activity   Alcohol use: Never   Drug use: Never   Sexual activity: Not on file

## 2024-04-19 ENCOUNTER — Other Ambulatory Visit (HOSPITAL_COMMUNITY): Payer: Self-pay

## 2024-04-20 ENCOUNTER — Other Ambulatory Visit: Payer: Self-pay

## 2024-04-20 ENCOUNTER — Other Ambulatory Visit (HOSPITAL_COMMUNITY): Payer: Self-pay

## 2024-04-20 DIAGNOSIS — N184 Chronic kidney disease, stage 4 (severe): Secondary | ICD-10-CM | POA: Diagnosis not present

## 2024-04-20 DIAGNOSIS — D631 Anemia in chronic kidney disease: Secondary | ICD-10-CM | POA: Diagnosis not present

## 2024-04-20 MED ORDER — FREESTYLE LIBRE 3 PLUS SENSOR MISC
0 refills | Status: DC
  Filled 2024-04-20: qty 6, 90d supply, fill #0

## 2024-04-21 ENCOUNTER — Other Ambulatory Visit: Payer: Self-pay

## 2024-04-21 ENCOUNTER — Other Ambulatory Visit: Payer: Self-pay | Admitting: Family Medicine

## 2024-04-21 ENCOUNTER — Other Ambulatory Visit (HOSPITAL_COMMUNITY): Payer: Self-pay

## 2024-04-21 DIAGNOSIS — M545 Low back pain, unspecified: Secondary | ICD-10-CM

## 2024-04-21 MED ORDER — GABAPENTIN 300 MG PO CAPS
300.0000 mg | ORAL_CAPSULE | Freq: Two times a day (BID) | ORAL | 0 refills | Status: DC
  Filled 2024-04-21: qty 60, 30d supply, fill #0

## 2024-04-21 MED ORDER — BUSPIRONE HCL 15 MG PO TABS
15.0000 mg | ORAL_TABLET | Freq: Three times a day (TID) | ORAL | 0 refills | Status: DC
  Filled 2024-04-21: qty 90, 30d supply, fill #0

## 2024-04-28 DIAGNOSIS — F411 Generalized anxiety disorder: Secondary | ICD-10-CM | POA: Diagnosis not present

## 2024-05-07 ENCOUNTER — Other Ambulatory Visit: Payer: Self-pay | Admitting: Family Medicine

## 2024-05-07 ENCOUNTER — Other Ambulatory Visit (HOSPITAL_COMMUNITY): Payer: Self-pay

## 2024-05-07 ENCOUNTER — Other Ambulatory Visit: Payer: Self-pay

## 2024-05-07 MED ORDER — FREESTYLE LIBRE 3 PLUS SENSOR MISC
0 refills | Status: AC
  Filled 2024-05-07: qty 6, fill #0
  Filled 2024-05-24 – 2024-07-14 (×3): qty 6, 90d supply, fill #0

## 2024-05-07 MED ORDER — NOVOLOG FLEXPEN 100 UNIT/ML ~~LOC~~ SOPN
PEN_INJECTOR | SUBCUTANEOUS | 3 refills | Status: AC
  Filled 2024-05-07: qty 15, 50d supply, fill #0
  Filled 2024-05-24 – 2024-07-28 (×3): qty 15, 50d supply, fill #1

## 2024-05-07 MED ORDER — HYDROCODONE-ACETAMINOPHEN 7.5-325 MG PO TABS
1.0000 | ORAL_TABLET | Freq: Three times a day (TID) | ORAL | 0 refills | Status: DC | PRN
  Filled 2024-05-07: qty 90, 30d supply, fill #0

## 2024-05-07 MED ORDER — NITROGLYCERIN 0.4 MG SL SUBL
SUBLINGUAL_TABLET | SUBLINGUAL | 3 refills | Status: AC
  Filled 2024-05-07: qty 50, 25d supply, fill #0
  Filled 2024-06-20: qty 50, 25d supply, fill #1
  Filled 2024-08-05: qty 50, 16d supply, fill #2

## 2024-05-07 NOTE — Telephone Encounter (Signed)
 Request refill

## 2024-05-08 ENCOUNTER — Other Ambulatory Visit (HOSPITAL_COMMUNITY): Payer: Self-pay

## 2024-05-08 ENCOUNTER — Other Ambulatory Visit: Payer: Self-pay

## 2024-05-09 DIAGNOSIS — Z89512 Acquired absence of left leg below knee: Secondary | ICD-10-CM | POA: Diagnosis not present

## 2024-05-09 DIAGNOSIS — S82853A Displaced trimalleolar fracture of unspecified lower leg, initial encounter for closed fracture: Secondary | ICD-10-CM | POA: Diagnosis not present

## 2024-05-11 ENCOUNTER — Other Ambulatory Visit (HOSPITAL_COMMUNITY): Payer: Self-pay

## 2024-05-12 ENCOUNTER — Other Ambulatory Visit (HOSPITAL_COMMUNITY): Payer: Self-pay

## 2024-05-12 ENCOUNTER — Encounter: Payer: Self-pay | Admitting: Family Medicine

## 2024-05-12 DIAGNOSIS — F411 Generalized anxiety disorder: Secondary | ICD-10-CM | POA: Diagnosis not present

## 2024-05-13 ENCOUNTER — Telehealth: Admitting: Family Medicine

## 2024-05-13 VITALS — BP 128/64 | HR 59 | Temp 98.6°F | Ht 64.5 in | Wt 197.0 lb

## 2024-05-13 DIAGNOSIS — N3000 Acute cystitis without hematuria: Secondary | ICD-10-CM | POA: Diagnosis not present

## 2024-05-13 DIAGNOSIS — Z5181 Encounter for therapeutic drug level monitoring: Secondary | ICD-10-CM

## 2024-05-13 DIAGNOSIS — E1142 Type 2 diabetes mellitus with diabetic polyneuropathy: Secondary | ICD-10-CM

## 2024-05-13 DIAGNOSIS — Z794 Long term (current) use of insulin: Secondary | ICD-10-CM | POA: Diagnosis not present

## 2024-05-13 DIAGNOSIS — I25118 Atherosclerotic heart disease of native coronary artery with other forms of angina pectoris: Secondary | ICD-10-CM | POA: Diagnosis not present

## 2024-05-13 DIAGNOSIS — R7989 Other specified abnormal findings of blood chemistry: Secondary | ICD-10-CM

## 2024-05-13 DIAGNOSIS — Z95811 Presence of heart assist device: Secondary | ICD-10-CM

## 2024-05-13 DIAGNOSIS — N184 Chronic kidney disease, stage 4 (severe): Secondary | ICD-10-CM

## 2024-05-13 DIAGNOSIS — Z1231 Encounter for screening mammogram for malignant neoplasm of breast: Secondary | ICD-10-CM

## 2024-05-13 DIAGNOSIS — D631 Anemia in chronic kidney disease: Secondary | ICD-10-CM | POA: Diagnosis not present

## 2024-05-13 MED ORDER — CEPHALEXIN 250 MG PO CAPS: 250.0000 mg | ORAL_CAPSULE | Freq: Two times a day (BID) | ORAL | 0 refills | Status: DC

## 2024-05-13 NOTE — Progress Notes (Signed)
 .  Virtual Visit via Video Note   This visit type was conducted due to her co-morbid illnesses, this patient is at risk for falls particularly in inclement weather, such as today.  This format is felt to be most appropriate for this patient at this time.  All issues noted in this document were discussed and addressed.  A limited physical exam was performed with this format.  A verbal consent was obtained for the virtual visit.   Patient Location:home Provider Location:office Evaluation Performed:  Follow-Up Visit Time:  Today, I have spent 30 minutes with the patient with telehealth technology discussing the above problems.    Follow Up:  In Person in 3 month(s)   Su units a day okay bjective:  Patient ID: Dana [name], female    DOB: 02/13/1961  Age: 63 y.o. MRN: [id number]  Chief Complaint  Patient presents with   Medical Management of Chronic Issues    HPI: Discussed the use of AI scribe software for clinical note transcription with the patient, who gave verbal consent to proceed.  History of Present Illness Patient is a 63 year old white female who presents via video visit for chronic medical problems.  Diabetes Complications: Neuropathy, gastroparesis, chronic kidney disease, coronary artery disease Sugars 160-200s okay so now you are take Lantus  7 units a day and NovoLog  7-10 units before meals. Patient checks her feet foot and her amputated stump daily.   Patient has a Gvoke pen Patient has freestyle libre 3. She has a follow-up with her endocrinologist in November.  Hypertensive heart disease: Currently on carvedilol  6.25 mg twice daily and torsemide  60 mg daily.  Patient also has a Lasix  pump for her congestive heart failure with parameters to use daily if needed.  Also takes aspirin  81 mg daily, Brilinta  90 mg twice daily. She sees the heart failure clinic.  Hyperlipidemia currently on Repatha  140 mg every 2 weeks and Zetia  10 mg daily.    Gout: Currently on  allopurinol  100 mg half pill daily L aspirin  81 mg a day GERD: Currently on pantoprazole  40 mg a day which works well.  Having bladder symptoms - cloudy urine. Prickly feeling, strong odor.   Patient is having difficulties with her husband adjusting to her amputation.  She wants to do more and he is reluctant to allow her to.  She is doing physical therapy at [facility].  Chronic back pain: Patient is on hydrocodone /acetaminophen  7.5 325 mg and takes anywhere from 1 to 3/day.  Depression/anxiety/sleep: She is currently on buspirone  15 mg 3 times daily.  She sleeps poorly because she is concerned about the issues with her husband.  He has agreed to go to some counseling with her.    Constipation: Patient has Linzess  but has not had to use it for several months.       05/13/2024    2:16 PM 02/10/2024   11:48 AM 10/24/2023   10:20 AM 10/04/2023   10:55 AM 07/29/2023   10:57 AM  Depression screen PHQ 2/9  Decreased Interest 0 2 1 1  0  Down, Depressed, Hopeless 1 2 2 2  0  PHQ - 2 Score 1 4 3 3  0  Altered sleeping 3 3 3 3 3   Tired, decreased energy 3 3 3 3 1   Change in appetite 0 1 3 3  0  Feeling bad or failure about yourself  1 1 2 2 2   Trouble concentrating 0 0 1 1 1   Moving slowly or fidgety/restless 1 0 0  0 0  Suicidal thoughts 0 0 0 0 0  PHQ-9 Score 9 12 15 15 7   Difficult doing work/chores Somewhat difficult Very difficult  Extremely dIfficult Somewhat difficult        10/24/2023   10:21 AM  Fall Risk   Falls in the past year? 0  Number falls in past yr: 0  Injury with Fall? 0  Risk for fall due to : No Fall Risks;Impaired balance/gait;Impaired mobility  Follow up Falls prevention discussed;Education provided;Falls evaluation completed    Patient Care Team: Sherre Clapper, MD as PCP - General (Family Medicine) Bensimhon, Toribio SAUNDERS, MD as PCP - Cardiology (Cardiology) Jeneal Danita Macintosh, MD as Consulting Physician (Allergy ) Bensimhon, Toribio SAUNDERS, MD as Consulting  Physician (Cardiology) Gregory Kent, MD as Referring Physician (Gastroenterology) Claudene Elsie JUDITHANN Mickey., MD as Consulting Physician (Endocrinology) Myrick Elspeth PARAS., DPM as Consulting Physician (Podiatry) Mai Lynwood FALCON, MD as Consulting Physician (Rheumatology) Fleurette Brow, MD as Referring Physician (Nephrology) Wendel Lurena POUR, MD as Consulting Physician (Cardiology) Cornelius Dayani DEL, MD as Consulting Physician (Oncology) Nathanael Deatrice Barrio, MD as Referring Physician Claudene Elsie JUDITHANN Mickey., MD (Endocrinology) Frances Ozell RAMAN, LCSW as [facility] Care Management (Licensed Clinical Social Worker) Hugh [facility] ([facility], [place] LABOR, FNP (Family Medicine)   Review of Systems  Constitutional:  Positive for chills. Negative for fatigue and fever.  HENT:  Negative for congestion, ear pain and sore throat.   Respiratory:  Negative for cough and shortness of breath.   Cardiovascular:  Negative for chest pain.  Gastrointestinal:  Negative for abdominal pain, constipation, diarrhea, nausea and vomiting.  Genitourinary:  Positive for dysuria.  Musculoskeletal:  Negative for arthralgias and myalgias.  Skin:  Negative for rash.  Neurological:  Negative for dizziness and headaches.  Psychiatric/Behavioral:  Negative for dysphoric mood. The patient is nervous/anxious.     Current Outpatient Medications on File Prior to Visit  Medication Sig Dispense Refill   acetaminophen  (TYLENOL ) 325 MG tablet Take 2 tablets (650 mg total) by mouth every 6 (six) hours as needed for mild pain (pain score 1-3) or fever (or Fever >/= 101).     allopurinol  (ZYLOPRIM ) 100 MG tablet Take 0.5 tablets (50 mg total) by mouth daily. 30 tablet 0   aspirin  EC 81 MG tablet Take 1 tablet (81 mg total) by mouth daily. Swallow whole. 30 tablet 12   busPIRone  (BUSPAR ) 15 MG tablet Take 1 tablet (15 mg total) by mouth 3 (three) times daily. 90 tablet 0    carvedilol  (COREG ) 6.25 MG tablet Take 6.25 mg by mouth 2 (two) times daily with a meal.     Continuous Glucose Sensor (FREESTYLE LIBRE 3 PLUS SENSOR) MISC Change sensor every 15 days. 6 each 0   diclofenac  Sodium (VOLTAREN ) 1 % GEL Apply 2 g topically 4 (four) times daily. 100 g 0   Evolocumab  (REPATHA  SURECLICK) 140 MG/ML SOAJ Inject 140 mg into the skin every 14 (fourteen) days. 6 mL 1   ezetimibe  (ZETIA ) 10 MG tablet Take 1 tablet (10 mg total) by mouth daily. 30 tablet 0   Furosemide  (FUROSCIX ) 80 MG/10ML CTKT Inject 80 mg into the skin as directed. 4 each 3   gabapentin  (NEURONTIN ) 300 MG capsule Take 1 capsule (300 mg total) by mouth 2 (two) times daily. 60 capsule 0   Glucagon  (GVOKE HYPOPEN  2-PACK) 0.5 MG/0.1ML SOAJ Inject 0.5 mg into the skin daily as needed. 0.2 mL 5   HYDROcodone -acetaminophen  (NORCO) 7.5-325  MG tablet Take 1 tablet by mouth 3 (three) times daily as needed. 90 tablet 0   insulin  aspart (NOVOLOG  FLEXPEN) 100 UNIT/ML FlexPen Inject 2 - 10 Units before meals based on SSI. 15 mL 3   insulin  glargine (LANTUS ) 100 UNIT/ML Solostar Pen Inject 5 Units into the skin daily. Expires 28 days after first use 15 mL 0   Insulin  Pen Needle (BD PEN NEEDLE NANO 2ND GEN) 32G X 4 MM MISC Inject 1 each into the skin in the morning, at noon, in the evening, and at bedtime. 600 each 3   Insulin  Pen Needle 32G X 4 MM MISC Use with Lantus  pen 100 each 0   latanoprost  (XALATAN ) 0.005 % ophthalmic solution Place 1 drop into both eyes at bedtime. 2.5 mL 0   lidocaine  4 % Place 1 patch onto the skin daily. Remove & Discard patch within 12 hours or as directed by MD 12 patch 0   linaclotide  (LINZESS ) 145 MCG CAPS capsule Take 1 capsule (145 mcg total) by mouth daily before breakfast. 30 capsule 0   Menthol -Methyl Salicylate  (ICY HOT EXTRA STRENGTH) 10-30 % CREA Apply 1 Application topically as needed (bilateral hips and knees). 85 g 1   nitroGLYCERIN  (NITROSTAT ) 0.4 MG SL tablet DISSOLVE ONE TABLET  UNDER THE TONGUE EVERY 5 MINUTES AS NEEDED FOR CHEST PAIN.  DO NOT EXCEED A TOTAL OF 3 DOSES IN 15 MINUTES 50 tablet 3   OXYGEN  Inhale 2 L/min into the lungs at bedtime.     pantoprazole  (PROTONIX ) 40 MG tablet Take 1 tablet (40 mg total) by mouth daily. 30 tablet 0   ticagrelor  (BRILINTA ) 90 MG TABS tablet Take 1 tablet (90 mg total) by mouth 2 (two) times daily. 60 tablet 0   torsemide  (DEMADEX ) 20 MG tablet Take 3 tablets (60 mg total) by mouth daily. 90 tablet 3   Vitamin D , Ergocalciferol , (DRISDOL ) 1.25 MG (50000 UNIT) CAPS capsule Take 1 capsule (50,000 Units total) by mouth every 7 (seven) days. 5 capsule 0   No current facility-administered medications on file prior to visit.   Past Medical History:  Diagnosis Date   Acquired absence of left great toe    Acquired absence of other left toe(s)    ACS (acute coronary syndrome) (HCC)    Acute bronchitis due to COVID-19 virus 03/03/2023   Acute on chronic combined systolic and diastolic CHF (congestive heart failure) (HCC) 09/16/2023   Acute respiratory failure with hypoxia (HCC) 07/20/2023   Anemia    Anoxic brain injury (HCC)    Anxiety    CAD (coronary artery disease)    DES mLAD 04/07/15; DES dRCA & DES pPDA 08/30/16; DES mCX & PTCA OM1 09/29/20   Charcot's joint of foot in type 2 diabetes mellitus (HCC) 12/15/2023   Chronic diastolic (congestive) heart failure (HCC)    Chronic pain    Chronic systolic (congestive) heart failure (HCC)    CKD (chronic kidney disease)    Contrast dye induced nephropathy, possible 09/03/2020   COPD (chronic obstructive pulmonary disease) (HCC)    COVID-19 virus infection 03/03/2023   Demand ischemia (HCC)    Diabetes (HCC)type 1    Diastolic CHF (HCC)    Dyspnea    Encounter for screening mammogram for malignant neoplasm of breast 09/16/2023   Family history of breast cancer 10/23/2021   Gastro-esophageal reflux disease without esophagitis    Hyperlipidemia    Hypertension    Hypertensive  heart disease with heart failure (HCC)  Hypoxemia    Idiopathic gout, multiple sites    Leukocytosis 03/29/2022   Localized edema    Low back pain    Mitral regurgitation    Mitral regurgitation    Mixed hyperlipidemia    Mixed incontinence    Morbid (severe) obesity due to excess calories (HCC)    Neuromuscular disorder (HCC)    Neuropathy    Non-ST elevation (NSTEMI) myocardial infarction (HCC)    08/29/20, 09/29/20   OSA (obstructive sleep apnea) 09/03/2020   Other specified hypothyroidism    PAF (paroxysmal atrial fibrillation) (HCC)    s/p pulmonary vein isolation by cryoablation 10/04/15 Dr. Meldon in [facility]   PEA (Pulseless electrical activity) [facility])    ~ 01/13/21 at [facility] during admission DKA, volume overload, possible CAP, hypoxia s/p ACLS with ROSC ~ 10-15   Pneumonia    Primary insomnia    Rheumatoid arthritis (HCC)    Stroke (HCC)    Thyroid  disease    Type 1 diabetes mellitus (HCC)    URI with cough and congestion 08/22/2023   Vitamin D  deficiency    Past Surgical History:  Procedure Laterality Date   ABDOMINAL HYSTERECTOMY     Partial- Still has both ovaries   AMPUTATION Left 12/18/2023   Procedure: AMPUTATION BELOW KNEE;  Surgeon: Harden Jerona GAILS, MD;  Location: [facility] OR;  Service: Orthopedics;  Laterality: Left;   ANKLE FUSION Left 11/13/2023   Procedure: HINDFOOT FUSION FIXATION OF ANKLE;  Surgeon: Kendal Franky SQUIBB, MD;  Location: [facility] OR;  Service: Orthopedics;  Laterality: Left;   CARDIOVASCULAR STRESS TEST  08/13/2014   Nuclear; Normal   CARPAL TUNNEL RELEASE     CATARACT EXTRACTION, BILATERAL     CESAREAN SECTION     CHOLECYSTECTOMY     CORONARY ANGIOPLASTY WITH STENT PLACEMENT     3 blockages/ 1 stent   CORONARY BALLOON ANGIOPLASTY N/A 07/18/2023   Procedure: CORONARY BALLOON ANGIOPLASTY;  Surgeon: Verlin Lonni BIRCH, MD;  Location: [facility] INVASIVE CV LAB;  Service: Cardiovascular;  Laterality: N/A;   CORONARY PRESSURE/FFR STUDY N/A 09/07/2022    Procedure: INTRAVASCULAR PRESSURE WIRE/FFR STUDY;  Surgeon: Jordan, Peter M, MD;  Location: [facility] INVASIVE CV LAB;  Service: Cardiovascular;  Laterality: N/A;   CORONARY STENT INTERVENTION N/A 09/29/2020   Procedure: CORONARY STENT INTERVENTION;  Surgeon: Wonda Sharper, MD;  Location: [facility] INVASIVE CV LAB;  Service: Cardiovascular;  Laterality: N/A;  CFX   CORONARY STENT INTERVENTION N/A 09/07/2022   Procedure: CORONARY STENT INTERVENTION;  Surgeon: Jordan, Peter M, MD;  Location: [facility] INVASIVE CV LAB;  Service: Cardiovascular;  Laterality: N/A;   CORONARY STENT INTERVENTION N/A 02/13/2023   Procedure: CORONARY STENT INTERVENTION;  Surgeon: Verlin Lonni BIRCH, MD;  Location: [facility] INVASIVE CV LAB;  Service: Cardiovascular;  Laterality: N/A;  LAD   CORONARY ULTRASOUND/IVUS N/A 02/13/2023   Procedure: Coronary Ultrasound/IVUS;  Surgeon: Verlin Lonni BIRCH, MD;  Location: [facility] INVASIVE CV LAB;  Service: Cardiovascular;  Laterality: N/A;   CORONARY/GRAFT ACUTE MI REVASCULARIZATION N/A 12/08/2022   Procedure: Coronary/Graft Acute MI Revascularization;  Surgeon: Anner Alm ORN, MD;  Location: [facility] INVASIVE CV LAB;  Service: Cardiovascular;  Laterality: N/A;   EYE SURGERY     LEFT HEART CATH AND CORONARY ANGIOGRAPHY N/A 09/29/2020   Procedure: LEFT HEART CATH AND CORONARY ANGIOGRAPHY;  Surgeon: Wonda Sharper, MD;  Location: [facility] INVASIVE CV LAB;  Service: Cardiovascular;  Laterality: N/A;   LEFT HEART CATH AND CORONARY ANGIOGRAPHY N/A 09/07/2022   Procedure: LEFT HEART CATH AND  CORONARY ANGIOGRAPHY;  Surgeon: Jordan, Peter M, MD;  Location: [facility] INVASIVE CV LAB;  Service: Cardiovascular;  Laterality: N/A;   LEFT HEART CATH AND CORONARY ANGIOGRAPHY N/A 12/08/2022   Procedure: LEFT HEART CATH AND CORONARY ANGIOGRAPHY;  Surgeon: Anner Alm ORN, MD;  Location: [facility] INVASIVE CV LAB;  Service: Cardiovascular;  Laterality: N/A;   LEFT HEART CATH AND CORONARY ANGIOGRAPHY N/A 02/13/2023   Procedure: LEFT HEART  CATH AND CORONARY ANGIOGRAPHY;  Surgeon: Verlin Lonni BIRCH, MD;  Location: [facility] INVASIVE CV LAB;  Service: Cardiovascular;  Laterality: N/A;   LEFT HEART CATH AND CORONARY ANGIOGRAPHY N/A 07/16/2023   Procedure: LEFT HEART CATH AND CORONARY ANGIOGRAPHY;  Surgeon: Verlin Lonni BIRCH, MD;  Location: [facility] INVASIVE CV LAB;  Service: Cardiovascular;  Laterality: N/A;   RIGHT HEART CATH N/A 04/27/2021   Procedure: RIGHT HEART CATH;  Surgeon: Cherrie Toribio SAUNDERS, MD;  Location: [facility] INVASIVE CV LAB;  Service: Cardiovascular;  Laterality: N/A;   RIGHT/LEFT HEART CATH AND CORONARY ANGIOGRAPHY N/A 01/07/2017   Procedure: Right/Left Heart Cath and Coronary Angiography;  Surgeon: Rolan Ezra RAMAN, MD;  Location: [facility] INVASIVE CV LAB;  Service: Cardiovascular;  Laterality: N/A;   ROTATOR CUFF REPAIR     TEE WITHOUT CARDIOVERSION N/A 04/28/2021   Procedure: TRANSESOPHAGEAL ECHOCARDIOGRAM (TEE);  Surgeon: Cherrie Toribio SAUNDERS, MD;  Location: [facility] ENDOSCOPY;  Service: Cardiovascular;  Laterality: N/A;   TEE WITHOUT CARDIOVERSION N/A 05/18/2021   Procedure: TRANSESOPHAGEAL ECHOCARDIOGRAM (TEE);  Surgeon: Thukkani, Arun K, MD;  Location: [facility] INVASIVE CV LAB;  Service: Cardiovascular;  Laterality: N/A;   TOE AMPUTATION Left    TRANSCATHETER MITRAL EDGE TO EDGE REPAIR N/A 05/18/2021   Procedure: MITRAL VALVE REPAIR;  Surgeon: Wendel Lurena POUR, MD;  Location: [facility] INVASIVE CV LAB;  Service: Cardiovascular;  Laterality: N/A;   TRANSESOPHAGEAL ECHOCARDIOGRAM (CATH LAB) N/A 12/23/2023   Procedure: TRANSESOPHAGEAL ECHOCARDIOGRAM;  Surgeon: Kate Lonni CROME, MD;  Location: [facility] INVASIVE CV LAB;  Service: Cardiovascular;  Laterality: N/A;   US  ECHOCARDIOGRAPHY  05/2017   Normal    Family History  Problem Relation Age of Onset   Breast cancer Mother 37       contralateral breast ca dx 9   Coronary artery disease Mother    Hypertension Mother    Hyperlipidemia Mother    Diabetes type II Mother    Lung cancer Mother  49   Diabetes Father    Hypertension Father    Mental illness Sister    Bipolar disorder Other    Depression Other    Schizophrenia Other    Cancer Other        MGF's sisters x2; unknown type   Social History   Socioeconomic History   Marital status: Married    Spouse name: [name]   Number of children: 2   Years of education: 16   Highest education level: Manufacturing engineer (e.g., MA, MS, MEng, MEd, MSW, MBA)  Occupational History   Occupation: on disability/retired early former engineer, site  Tobacco Use   Smoking status: Former    Current packs/day: 0.00    Types: Cigarettes    Start date: 73    Quit date: 1984    Years since quitting: 41.8    Passive exposure: Past   Smokeless tobacco: Never  Vaping Use   Vaping status: Never Used  Substance and Sexual Activity   Alcohol use: Never   Drug use: Never   Sexual activity: Not on file  Other Topics Concern   Not on file  Social History Narrative   Not on file   Social Drivers of Health   Financial Resource Strain: Low Risk  (10/24/2023)   Overall Financial Resource Strain (CARDIA)    Difficulty of Paying Living Expenses: Not hard at all  Food Insecurity: No Food Insecurity (01/21/2024)   Hunger Vital Sign    Worried About Running Out of Food in the Last Year: Never true    Ran Out of Food in the Last Year: Never true  Transportation Needs: No Transportation Needs (01/21/2024)   PRAPARE - Administrator, Civil Service (Medical): No    Lack of Transportation (Non-Medical): No  Physical Activity: Insufficiently Active (10/24/2023)   Exercise Vital Sign    Days of Exercise per Week: 3 days    Minutes of Exercise per Session: 30 min  Stress: Stress Concern Present (10/24/2023)   [employer] of Occupational Health - Occupational Stress Questionnaire    Feeling of Stress : To some extent  Social Connections: Moderately Integrated (12/14/2023)   Social Connection and Isolation Panel    Frequency of  Communication with Friends and Family: More than three times a week    Frequency of Social Gatherings with Friends and Family: Three times a week    Attends Religious Services: More than 4 times per year    Active Member of Clubs or Organizations: No    Attends Banker Meetings: Never    Marital Status: Married    Objective:  BP 128/64   Pulse (!) 59   Temp 98.6 F (37 C)   Ht 5' 4.5 (1.638 m)   Wt 197 lb (89.4 kg)   BMI 33.29 kg/m      05/13/2024    3:33 PM 05/13/2024    2:02 PM 03/13/2024   11:05 AM  BP/Weight  Systolic BP 128 168 118  Diastolic BP 64 72 76  Wt. (Lbs)  197 213.2  BMI  33.29 kg/m2 35.48 kg/m2    Physical Exam Vitals reviewed.  Constitutional:      Appearance: Normal appearance.  Pulmonary:     Effort: No respiratory distress.  Neurological:     Mental Status: She is alert.         Lab Results  Component Value Date   WBC 8.2 02/10/2024   HGB 7.6 (L) 02/10/2024   HCT 26.2 (L) 02/10/2024   PLT 202 02/10/2024   GLUCOSE 100 (H) 03/13/2024   CHOL 124 02/10/2024   TRIG 76 02/10/2024   HDL 50 02/10/2024   LDLCALC 59 02/10/2024   ALT 10 02/10/2024   AST 7 02/10/2024   NA 139 03/13/2024   K 4.3 03/13/2024   CL 99 03/13/2024   CREATININE 1.87 (H) 03/13/2024   BUN 62 (H) 03/13/2024   CO2 29 03/13/2024   TSH 6.320 (H) 02/10/2024   INR 1.1 05/16/2021   HGBA1C 10.2 (H) 11/12/2023    Results for orders placed or performed in visit on 04/14/24  Lab report - scanned   Collection Time: 04/02/24  2:32 PM  Result Value Ref Range   EGFR 27.0    Creatinine, POC 42.5 mg/dL   Albumin , Urine POC 451.9    Microalb Creat Ratio 1,063    *Note: Due to a large number of results and/or encounters for the requested time period, some results have not been displayed. A complete set of results can be found in Results Review.  .  Assessment & Plan:   Assessment & Plan Diabetic polyneuropathy associated  with type 2 diabetes mellitus  (HCC) Keep appointment with Dr. Claudene,  Get labs when you come in for mammogram and flu shot.  No changes to medications.  Orders:   CBC with Differential/Platelet; Future   Comprehensive metabolic panel with GFR; Future   Hemoglobin A1c; Future  Visit for screening mammogram  Orders:   MM DIGITAL SCREENING BILATERAL; Future  Anemia in stage 4 chronic kidney disease (HCC) Check blood count 05/20/2024.     Coronary artery disease of native artery of native heart with stable angina pectoris Management per specialist.  The current medical regimen is effective;  continue present plan and medications.  Orders:   Lipid panel; Future  Presence of heart assist device (HCC) Management per specialist.     Abnormal TSH Check tsh Orders:   TSH; Future  Acute cystitis without hematuria Presumptive treatment with keflex , based on symptoms.      Body mass index is 33.29 kg/m.  .   Meds ordered this encounter  Medications   cephALEXin  (KEFLEX ) 250 MG capsule    Sig: Take 1 capsule (250 mg total) by mouth 2 (two) times daily.    Dispense:  14 capsule    Refill:  0    Orders Placed This Encounter  Procedures   MM DIGITAL SCREENING BILATERAL   CBC with Differential/Platelet   Comprehensive metabolic panel with GFR   Hemoglobin A1c   Lipid panel   TSH       Follow-up: Return in about 3 months (around 08/13/2024) for chronic follow up.  An After Visit Summary was printed and given to the patient.  Abigail Free, MD [facility] [phone]

## 2024-05-13 NOTE — Telephone Encounter (Signed)
 Dr. Sherre perks to do a video visit. Patient is aware.

## 2024-05-13 NOTE — Assessment & Plan Note (Addendum)
 Check blood count 05/20/2024.

## 2024-05-13 NOTE — Patient Instructions (Signed)
 Please schedule an appointment for your FLU shot for 2025-2026 year!  Flu Clinic Days (Thursday's Preferred) September: 4, 18, 25 October: 2, 9, 16, 23, 30 November: 6, 13, 20 December: 4, 11, 18

## 2024-05-13 NOTE — Assessment & Plan Note (Addendum)
 Management per specialist.

## 2024-05-13 NOTE — Assessment & Plan Note (Addendum)
 Keep appointment with Dr. Claudene,  Get labs when you come in for mammogram and flu shot.  No changes to medications.  Orders:   CBC with Differential/Platelet; Future   Comprehensive metabolic panel with GFR; Future   Hemoglobin A1c; Future

## 2024-05-13 NOTE — Assessment & Plan Note (Addendum)
 Management per specialist.  The current medical regimen is effective;  continue present plan and medications.  Orders:   Lipid panel; Future

## 2024-05-14 ENCOUNTER — Ambulatory Visit: Admitting: Physician Assistant

## 2024-05-14 ENCOUNTER — Encounter: Payer: Self-pay | Admitting: Physician Assistant

## 2024-05-14 DIAGNOSIS — Z89512 Acquired absence of left leg below knee: Secondary | ICD-10-CM

## 2024-05-14 NOTE — Progress Notes (Signed)
 Office Visit Note   Patient: Dana [name]           Date of Birth: [birth year]           MRN: [id number] Visit Date: 05/14/2024              Requested by: Sherre Clapper, MD [address] [place],  [place] [address] PCP: Sherre Clapper, MD  Chief Complaint  Patient presents with   Left Leg - Follow-up    Hx bka      HPI: Patient is a 63 year old woman who is seen for initial evaluation from her left below-knee amputation. Surgery on June 4.  On her last visit she has received her left BKA prothesis and we ordered PT gait training which she has had 6 visits and is doing very well. She had a small rash area on her proximal thigh that looked like tyny blisters.  She has been cleaning it with Vashe daily.  Now it is a small scab.  No drainage, no fever or chills.   Assessment & Plan: Visit Diagnoses: No diagnosis found.  Plan: Clean the Vashe keep covered between the sleeve to prevent maceration.  She will continue with PT for gait training.  F/U PRN.  If she has problems she will call.    Follow-Up Instructions: No follow-ups on file.   Ortho Exam  Patient is alert, oriented, no adenopathy, well-dressed, normal affect, normal respiratory effort. Well healed left BKA stump.  No edema or cellulitis.  Warm to touch.  The proximal scab appears healthy, no cellulitis or drainage.  The scab is flat.  It measures 1 cm x 0.7 cm.   Ambulates well with her left prothesis and she is very pleased with her recovery.      Imaging: No results found. No images are attached to the encounter.  Labs: Lab Results  Component Value Date   HGBA1C 10.2 (H) 11/12/2023   HGBA1C 9.0 (H) 05/14/2023   HGBA1C 8.5 (H) 02/13/2023   ESRSEDRATE 94 (H) 03/23/2020   ESRSEDRATE 85 (H) 11/07/2017   CRP 25 (H) 03/23/2020   LABURIC 6.5 05/14/2023   LABURIC 9.0 (H) 01/19/2022   LABURIC 9.3 (H) 04/25/2021   REPTSTATUS 01/25/2024 FINAL 01/20/2024   CULT  01/20/2024    NO GROWTH 5 DAYS Performed at  [facility], [address], [place], [place] [address]    LABORGA KLEBSIELLA PNEUMONIAE (A) 01/03/2024   LABORGA ESCHERICHIA COLI (A) 01/03/2024     Lab Results  Component Value Date   ALBUMIN  3.4 (L) 02/10/2024   ALBUMIN  2.3 (L) 01/25/2024   ALBUMIN  2.3 (L) 01/23/2024    Lab Results  Component Value Date   MG 2.0 01/24/2024   MG 2.0 01/21/2024   MG 1.8 12/25/2023   Lab Results  Component Value Date   VD25OH 15.7 (L) 02/10/2024   VD25OH 22.70 (L) 11/12/2023   VD25OH 25.2 (L) 04/04/2023    No results found for: PREALBUMIN    Latest Ref Rng & Units 02/10/2024   12:11 PM 01/23/2024    2:26 AM 01/22/2024    2:56 AM  CBC EXTENDED  WBC 3.4 - 10.8 x10E3/uL 8.2  8.4  9.0   RBC 3.77 - 5.28 x10E6/uL 2.80  3.45  3.59   Hemoglobin 11.1 - 15.9 g/dL 7.6  9.2  9.6   HCT 65.9 - 46.6 % 26.2  29.8  30.9   Platelets 150 - 450 x10E3/uL 202  320  290   NEUT# 1.4 - 7.0 x10E3/uL 6.4     Lymph# 0.7 - 3.1 x10E3/uL 1.0        There is no height or weight on file to calculate BMI.  Orders:  No orders of the defined types were placed in this encounter.  No orders of the defined types were placed in this encounter.    Procedures: No procedures performed  Clinical Data: No additional findings.  ROS:  All other systems negative, except as noted in the HPI. Review of Systems  Objective: Vital Signs: There were no vitals taken for this visit.  Specialty Comments:  No specialty comments available.  PMFS History: Patient Active Problem List   Diagnosis Date Noted   Chronic heart failure with preserved ejection fraction (HFpEF) (HCC) 02/11/2024   Acute cough 01/26/2024   Allergic rhinitis 01/26/2024   Obesity, class 1 01/22/2024   Acute on chronic systolic CHF (congestive heart failure) (HCC) 01/20/2024   Acute metabolic encephalopathy 01/20/2024   ABLA (acute blood loss anemia) 01/02/2024   Moderate episode of recurrent major depressive disorder (HCC) 01/01/2024    Left below-knee amputee (HCC) 12/27/2023   Status post below-knee amputation of left lower extremity (HCC) 12/20/2023   High pulmonary arterial pressure (HCC) 12/17/2023   Severe protein-calorie malnutrition 12/15/2023   Severe sepsis secondary to postoperative left ankle infection and MRSA bacteremia 12/14/2023   MRSA bacteremia - end of therapy with Daptomycin  12/28/2023. 12/14/2023   Depression with anxiety 12/14/2023   Acute pyelonephritis 12/14/2023   Trimalleolar fracture of ankle, closed 11/11/2023   Situational insomnia 10/04/2023   Acute on chronic combined systolic and diastolic CHF (congestive heart failure) (HCC) 09/16/2023   SOB (shortness of breath) 08/27/2023   Hospital discharge follow-up 07/29/2023   Coronary artery disease involving native coronary artery of native heart with angina pectoris 07/20/2023   Presence of heart assist device (HCC) 07/13/2023   Moderate recurrent major depression (HCC) 07/13/2023   History of amputation of hallux 05/24/2023   Bilateral hand pain 05/14/2023   Contrast-induced nephropathy 02/20/2023   Unstable angina (HCC) 09/07/2022   Chronic idiopathic constipation 09/01/2022   Hyperphosphatemia 01/09/2022   Genetic testing 11/07/2021   Family history of breast cancer 10/23/2021   GAD (generalized anxiety disorder) 07/11/2021   Mixed hyperlipidemia 07/11/2021   S/P mitral valve clip implantation 05/18/2021   History of cardiac arrest 02/06/2021   Impaired mobility and ADLs 01/20/2021   Left-sided weakness 01/20/2021   Drug-induced myopathy 01/03/2021   Secondary hyperparathyroidism of renal origin 11/18/2020   Vitamin D  insufficiency 11/18/2020   Uncontrolled type 1 diabetes mellitus with hyperglycemia, with long-term current use of insulin  (HCC) 10/13/2020   CKD stage 4 due to type 1 diabetes mellitus (HCC) - baseline 2.2 - 2.4 10/13/2020   NSTEMI (non-ST elevated myocardial infarction) (HCC) 09/03/2020   Contrast dye induced  nephropathy, possible 09/03/2020   OSA on CPAP 09/03/2020   Insulin  dependent type 2 diabetes mellitus (HCC) 09/03/2020   Need for COVID-19 vaccine 06/28/2020   Chronic gout without tophus 03/23/2020   Pain in both hands 03/23/2020   Hypomagnesemia 12/21/2019   Lumbar back pain 10/05/2019   Hypertensive heart and renal disease with congestive heart failure (HCC) 09/18/2019   Chronic combined systolic (congestive) and diastolic (congestive) heart failure (HCC) 11/07/2017   Class 2 severe obesity due to excess calories with serious comorbidity and body mass index (BMI) of 39.0 to 39.9 in adult 11/07/2017   Hyperlipidemia LDL goal <70 12/14/2016  Anemia in CKD (chronic kidney disease) 03/23/2016   Chronic pain syndrome 03/23/2016   Coronary artery disease of native artery of native heart with stable angina pectoris 03/23/2016   GERD without esophagitis 03/23/2016   On home oxygen  therapy 03/23/2016   S/P ablation of atrial fibrillation 10/04/2015   Diabetic polyneuropathy associated with type 2 diabetes mellitus (HCC) 04/02/2014   Past Medical History:  Diagnosis Date   Acquired absence of left great toe    Acquired absence of other left toe(s)    ACS (acute coronary syndrome) (HCC)    Acute bronchitis due to COVID-19 virus 03/03/2023   Acute on chronic combined systolic and diastolic CHF (congestive heart failure) (HCC) 09/16/2023   Acute respiratory failure with hypoxia (HCC) 07/20/2023   Anemia    Anoxic brain injury (HCC)    Anxiety    CAD (coronary artery disease)    DES mLAD 04/07/15; DES dRCA & DES pPDA 08/30/16; DES mCX & PTCA OM1 09/29/20   Charcot's joint of foot in type 2 diabetes mellitus (HCC) 12/15/2023   Chronic diastolic (congestive) heart failure (HCC)    Chronic pain    Chronic systolic (congestive) heart failure (HCC)    CKD (chronic kidney disease)    Contrast dye induced nephropathy, possible 09/03/2020   COPD (chronic obstructive pulmonary disease) (HCC)     COVID-19 virus infection 03/03/2023   Demand ischemia (HCC)    Diabetes (HCC)type 1    Diastolic CHF (HCC)    Dyspnea    Encounter for screening mammogram for malignant neoplasm of breast 09/16/2023   Family history of breast cancer 10/23/2021   Gastro-esophageal reflux disease without esophagitis    Hyperlipidemia    Hypertension    Hypertensive heart disease with heart failure (HCC)    Hypoxemia    Idiopathic gout, multiple sites    Leukocytosis 03/29/2022   Localized edema    Low back pain    Mitral regurgitation    Mitral regurgitation    Mixed hyperlipidemia    Mixed incontinence    Morbid (severe) obesity due to excess calories (HCC)    Neuromuscular disorder (HCC)    Neuropathy    Non-ST elevation (NSTEMI) myocardial infarction (HCC)    08/29/20, 09/29/20   OSA (obstructive sleep apnea) 09/03/2020   Other specified hypothyroidism    PAF (paroxysmal atrial fibrillation) (HCC)    s/p pulmonary vein isolation by cryoablation 10/04/15 Dr. Meldon in [facility]   PEA (Pulseless electrical activity) [facility])    ~ 01/13/21 at [facility] during admission DKA, volume overload, possible CAP, hypoxia s/p ACLS with ROSC ~ 10-15   Pneumonia    Primary insomnia    Rheumatoid arthritis (HCC)    Stroke (HCC)    Thyroid  disease    Type 1 diabetes mellitus (HCC)    URI with cough and congestion 08/22/2023   Vitamin D  deficiency     Family History  Problem Relation Age of Onset   Breast cancer Mother 75       contralateral breast ca dx 71   Coronary artery disease Mother    Hypertension Mother    Hyperlipidemia Mother    Diabetes type II Mother    Lung cancer Mother 53   Diabetes Father    Hypertension Father    Mental illness Sister    Bipolar disorder Other    Depression Other    Schizophrenia Other    Cancer Other        MGF's sisters x2; unknown type  Past Surgical History:  Procedure Laterality Date   ABDOMINAL HYSTERECTOMY     Partial- Still has both ovaries    AMPUTATION Left 12/18/2023   Procedure: AMPUTATION BELOW KNEE;  Surgeon: Harden Jerona GAILS, MD;  Location: [facility] OR;  Service: Orthopedics;  Laterality: Left;   ANKLE FUSION Left 11/13/2023   Procedure: HINDFOOT FUSION FIXATION OF ANKLE;  Surgeon: Kendal Franky SQUIBB, MD;  Location: [facility] OR;  Service: Orthopedics;  Laterality: Left;   CARDIOVASCULAR STRESS TEST  08/13/2014   Nuclear; Normal   CARPAL TUNNEL RELEASE     CATARACT EXTRACTION, BILATERAL     CESAREAN SECTION     CHOLECYSTECTOMY     CORONARY ANGIOPLASTY WITH STENT PLACEMENT     3 blockages/ 1 stent   CORONARY BALLOON ANGIOPLASTY N/A 07/18/2023   Procedure: CORONARY BALLOON ANGIOPLASTY;  Surgeon: Verlin Lonni BIRCH, MD;  Location: [facility] INVASIVE CV LAB;  Service: Cardiovascular;  Laterality: N/A;   CORONARY PRESSURE/FFR STUDY N/A 09/07/2022   Procedure: INTRAVASCULAR PRESSURE WIRE/FFR STUDY;  Surgeon: Jordan, Peter M, MD;  Location: [facility] INVASIVE CV LAB;  Service: Cardiovascular;  Laterality: N/A;   CORONARY STENT INTERVENTION N/A 09/29/2020   Procedure: CORONARY STENT INTERVENTION;  Surgeon: Wonda Sharper, MD;  Location: [facility] INVASIVE CV LAB;  Service: Cardiovascular;  Laterality: N/A;  CFX   CORONARY STENT INTERVENTION N/A 09/07/2022   Procedure: CORONARY STENT INTERVENTION;  Surgeon: Jordan, Peter M, MD;  Location: [facility] INVASIVE CV LAB;  Service: Cardiovascular;  Laterality: N/A;   CORONARY STENT INTERVENTION N/A 02/13/2023   Procedure: CORONARY STENT INTERVENTION;  Surgeon: Verlin Lonni BIRCH, MD;  Location: [facility] INVASIVE CV LAB;  Service: Cardiovascular;  Laterality: N/A;  LAD   CORONARY ULTRASOUND/IVUS N/A 02/13/2023   Procedure: Coronary Ultrasound/IVUS;  Surgeon: Verlin Lonni BIRCH, MD;  Location: [facility] INVASIVE CV LAB;  Service: Cardiovascular;  Laterality: N/A;   CORONARY/GRAFT ACUTE MI REVASCULARIZATION N/A 12/08/2022   Procedure: Coronary/Graft Acute MI Revascularization;  Surgeon: Anner Alm ORN, MD;  Location: [facility] INVASIVE CV  LAB;  Service: Cardiovascular;  Laterality: N/A;   EYE SURGERY     LEFT HEART CATH AND CORONARY ANGIOGRAPHY N/A 09/29/2020   Procedure: LEFT HEART CATH AND CORONARY ANGIOGRAPHY;  Surgeon: Wonda Sharper, MD;  Location: [facility] INVASIVE CV LAB;  Service: Cardiovascular;  Laterality: N/A;   LEFT HEART CATH AND CORONARY ANGIOGRAPHY N/A 09/07/2022   Procedure: LEFT HEART CATH AND CORONARY ANGIOGRAPHY;  Surgeon: Jordan, Peter M, MD;  Location: [facility] INVASIVE CV LAB;  Service: Cardiovascular;  Laterality: N/A;   LEFT HEART CATH AND CORONARY ANGIOGRAPHY N/A 12/08/2022   Procedure: LEFT HEART CATH AND CORONARY ANGIOGRAPHY;  Surgeon: Anner Alm ORN, MD;  Location: [facility] INVASIVE CV LAB;  Service: Cardiovascular;  Laterality: N/A;   LEFT HEART CATH AND CORONARY ANGIOGRAPHY N/A 02/13/2023   Procedure: LEFT HEART CATH AND CORONARY ANGIOGRAPHY;  Surgeon: Verlin Lonni BIRCH, MD;  Location: [facility] INVASIVE CV LAB;  Service: Cardiovascular;  Laterality: N/A;   LEFT HEART CATH AND CORONARY ANGIOGRAPHY N/A 07/16/2023   Procedure: LEFT HEART CATH AND CORONARY ANGIOGRAPHY;  Surgeon: Verlin Lonni BIRCH, MD;  Location: [facility] INVASIVE CV LAB;  Service: Cardiovascular;  Laterality: N/A;   RIGHT HEART CATH N/A 04/27/2021   Procedure: RIGHT HEART CATH;  Surgeon: Cherrie Toribio SAUNDERS, MD;  Location: [facility] INVASIVE CV LAB;  Service: Cardiovascular;  Laterality: N/A;   RIGHT/LEFT HEART CATH AND CORONARY ANGIOGRAPHY N/A 01/07/2017   Procedure: Right/Left Heart Cath and Coronary Angiography;  Surgeon: Rolan Ezra RAMAN, MD;  Location: [facility]  INVASIVE CV LAB;  Service: Cardiovascular;  Laterality: N/A;   ROTATOR CUFF REPAIR     TEE WITHOUT CARDIOVERSION N/A 04/28/2021   Procedure: TRANSESOPHAGEAL ECHOCARDIOGRAM (TEE);  Surgeon: Cherrie Toribio SAUNDERS, MD;  Location: [facility] ENDOSCOPY;  Service: Cardiovascular;  Laterality: N/A;   TEE WITHOUT CARDIOVERSION N/A 05/18/2021   Procedure: TRANSESOPHAGEAL ECHOCARDIOGRAM (TEE);  Surgeon: Thukkani, Arun K,  MD;  Location: [facility] INVASIVE CV LAB;  Service: Cardiovascular;  Laterality: N/A;   TOE AMPUTATION Left    TRANSCATHETER MITRAL EDGE TO EDGE REPAIR N/A 05/18/2021   Procedure: MITRAL VALVE REPAIR;  Surgeon: Wendel Lurena POUR, MD;  Location: [facility] INVASIVE CV LAB;  Service: Cardiovascular;  Laterality: N/A;   TRANSESOPHAGEAL ECHOCARDIOGRAM (CATH LAB) N/A 12/23/2023   Procedure: TRANSESOPHAGEAL ECHOCARDIOGRAM;  Surgeon: Kate Lonni CROME, MD;  Location: [facility] INVASIVE CV LAB;  Service: Cardiovascular;  Laterality: N/A;   US  ECHOCARDIOGRAPHY  05/2017   Normal   Social History   Occupational History   Occupation: on disability/retired early former engineer, site  Tobacco Use   Smoking status: Former    Current packs/day: 0.00    Types: Cigarettes    Start date: 89    Quit date: 1984    Years since quitting: 41.8    Passive exposure: Past   Smokeless tobacco: Never  Vaping Use   Vaping status: Never Used  Substance and Sexual Activity   Alcohol use: Never   Drug use: Never   Sexual activity: Not on file

## 2024-05-15 ENCOUNTER — Encounter: Payer: Self-pay | Admitting: Family Medicine

## 2024-05-16 ENCOUNTER — Encounter: Payer: Self-pay | Admitting: Family Medicine

## 2024-05-16 DIAGNOSIS — R7989 Other specified abnormal findings of blood chemistry: Secondary | ICD-10-CM | POA: Insufficient documentation

## 2024-05-16 DIAGNOSIS — N3000 Acute cystitis without hematuria: Secondary | ICD-10-CM | POA: Insufficient documentation

## 2024-05-16 NOTE — Assessment & Plan Note (Signed)
 Check tsh       Orders:    TSH; Future

## 2024-05-16 NOTE — Assessment & Plan Note (Addendum)
  Orders:   MM DIGITAL SCREENING BILATERAL; Future

## 2024-05-16 NOTE — Assessment & Plan Note (Signed)
 Presumptive treatment with keflex , based on symptoms.

## 2024-05-18 ENCOUNTER — Encounter: Payer: Self-pay | Admitting: Radiology

## 2024-05-20 ENCOUNTER — Inpatient Hospital Stay (HOSPITAL_BASED_OUTPATIENT_CLINIC_OR_DEPARTMENT_OTHER): Admission: RE | Admit: 2024-05-20 | Source: Ambulatory Visit | Admitting: Radiology

## 2024-05-21 ENCOUNTER — Ambulatory Visit

## 2024-05-24 ENCOUNTER — Other Ambulatory Visit: Payer: Self-pay | Admitting: Family Medicine

## 2024-05-24 DIAGNOSIS — M545 Low back pain, unspecified: Secondary | ICD-10-CM

## 2024-05-24 MED ORDER — GABAPENTIN 300 MG PO CAPS
300.0000 mg | ORAL_CAPSULE | Freq: Two times a day (BID) | ORAL | 3 refills | Status: AC
  Filled 2024-05-24: qty 180, 90d supply, fill #0

## 2024-05-25 ENCOUNTER — Ambulatory Visit

## 2024-05-25 ENCOUNTER — Other Ambulatory Visit (HOSPITAL_BASED_OUTPATIENT_CLINIC_OR_DEPARTMENT_OTHER): Payer: Self-pay

## 2024-05-25 ENCOUNTER — Other Ambulatory Visit: Payer: Self-pay

## 2024-05-26 DIAGNOSIS — R2681 Unsteadiness on feet: Secondary | ICD-10-CM | POA: Diagnosis not present

## 2024-05-26 DIAGNOSIS — R2689 Other abnormalities of gait and mobility: Secondary | ICD-10-CM | POA: Diagnosis not present

## 2024-05-26 DIAGNOSIS — F411 Generalized anxiety disorder: Secondary | ICD-10-CM | POA: Diagnosis not present

## 2024-05-26 DIAGNOSIS — M79605 Pain in left leg: Secondary | ICD-10-CM | POA: Diagnosis not present

## 2024-05-27 DIAGNOSIS — N184 Chronic kidney disease, stage 4 (severe): Secondary | ICD-10-CM | POA: Diagnosis not present

## 2024-05-27 DIAGNOSIS — E782 Mixed hyperlipidemia: Secondary | ICD-10-CM | POA: Diagnosis not present

## 2024-05-27 DIAGNOSIS — E1022 Type 1 diabetes mellitus with diabetic chronic kidney disease: Secondary | ICD-10-CM | POA: Diagnosis not present

## 2024-05-27 DIAGNOSIS — E1042 Type 1 diabetes mellitus with diabetic polyneuropathy: Secondary | ICD-10-CM | POA: Diagnosis not present

## 2024-05-27 DIAGNOSIS — I129 Hypertensive chronic kidney disease with stage 1 through stage 4 chronic kidney disease, or unspecified chronic kidney disease: Secondary | ICD-10-CM | POA: Diagnosis not present

## 2024-05-27 DIAGNOSIS — Z794 Long term (current) use of insulin: Secondary | ICD-10-CM | POA: Diagnosis not present

## 2024-05-27 DIAGNOSIS — D631 Anemia in chronic kidney disease: Secondary | ICD-10-CM | POA: Diagnosis not present

## 2024-05-28 ENCOUNTER — Other Ambulatory Visit: Payer: Self-pay

## 2024-05-28 ENCOUNTER — Other Ambulatory Visit (HOSPITAL_COMMUNITY): Payer: Self-pay

## 2024-05-28 ENCOUNTER — Ambulatory Visit

## 2024-05-28 DIAGNOSIS — E1142 Type 2 diabetes mellitus with diabetic polyneuropathy: Secondary | ICD-10-CM | POA: Diagnosis not present

## 2024-05-28 DIAGNOSIS — I25118 Atherosclerotic heart disease of native coronary artery with other forms of angina pectoris: Secondary | ICD-10-CM | POA: Diagnosis not present

## 2024-05-28 DIAGNOSIS — Z23 Encounter for immunization: Secondary | ICD-10-CM | POA: Diagnosis not present

## 2024-05-28 DIAGNOSIS — R7989 Other specified abnormal findings of blood chemistry: Secondary | ICD-10-CM | POA: Diagnosis not present

## 2024-05-28 MED ORDER — LANTUS SOLOSTAR 100 UNIT/ML ~~LOC~~ SOPN
50.0000 [IU] | PEN_INJECTOR | Freq: Every day | SUBCUTANEOUS | 5 refills | Status: AC
  Filled 2024-05-28: qty 15, 30d supply, fill #0
  Filled 2024-06-25 (×2): qty 15, 30d supply, fill #1
  Filled 2024-08-05: qty 15, 30d supply, fill #2

## 2024-05-28 MED ORDER — NOVOLOG FLEXPEN 100 UNIT/ML ~~LOC~~ SOPN
50.0000 [IU] | PEN_INJECTOR | Freq: Every day | SUBCUTANEOUS | 5 refills | Status: AC
  Filled 2024-05-28 – 2024-06-04 (×3): qty 15, 30d supply, fill #0
  Filled 2024-06-20: qty 15, 50d supply, fill #0

## 2024-05-28 MED ORDER — FREESTYLE LIBRE 2 READER DEVI
0 refills | Status: AC
  Filled 2024-05-28: qty 1, 30d supply, fill #0
  Filled 2024-06-15: qty 1, 90d supply, fill #0

## 2024-05-29 ENCOUNTER — Telehealth: Payer: Self-pay

## 2024-05-29 LAB — LIPID PANEL
Chol/HDL Ratio: 2.6 ratio (ref 0.0–4.4)
Cholesterol, Total: 118 mg/dL (ref 100–199)
HDL: 46 mg/dL (ref >39)
LDL Chol Calc (NIH): 52 mg/dL (ref 0–99)
Triglycerides: 108 mg/dL (ref 0–149)
VLDL Cholesterol Cal: 20 mg/dL (ref 5–40)

## 2024-05-29 LAB — COMPREHENSIVE METABOLIC PANEL WITH GFR
ALT: 16 IU/L (ref 0–32)
AST: 17 IU/L (ref 0–40)
Albumin: 4 g/dL (ref 3.9–4.9)
Alkaline Phosphatase: 244 IU/L — ABNORMAL HIGH (ref 49–135)
BUN/Creatinine Ratio: 34 — ABNORMAL HIGH (ref 12–28)
BUN: 96 mg/dL (ref 8–27)
Bilirubin Total: 0.4 mg/dL (ref 0.0–1.2)
CO2: 22 mmol/L (ref 20–29)
Calcium: 9 mg/dL (ref 8.7–10.3)
Chloride: 93 mmol/L — ABNORMAL LOW (ref 96–106)
Creatinine, Ser: 2.82 mg/dL — ABNORMAL HIGH (ref 0.57–1.00)
Globulin, Total: 3.5 g/dL (ref 1.5–4.5)
Glucose: 450 mg/dL — ABNORMAL HIGH (ref 70–99)
Potassium: 5.5 mmol/L — ABNORMAL HIGH (ref 3.5–5.2)
Sodium: 132 mmol/L — ABNORMAL LOW (ref 134–144)
Total Protein: 7.5 g/dL (ref 6.0–8.5)
eGFR: 18 mL/min/1.73 — ABNORMAL LOW (ref >59)

## 2024-05-29 LAB — CBC WITH DIFFERENTIAL/PLATELET
Basophils Absolute: 0.1 x10E3/uL (ref 0.0–0.2)
Basos: 0 %
EOS (ABSOLUTE): 0.2 x10E3/uL (ref 0.0–0.4)
Eos: 2 %
Hematocrit: 33.7 % — ABNORMAL LOW (ref 34.0–46.6)
Hemoglobin: 10.3 g/dL — ABNORMAL LOW (ref 11.1–15.9)
Immature Grans (Abs): 0 x10E3/uL (ref 0.0–0.1)
Immature Granulocytes: 0 %
Lymphocytes Absolute: 1.4 x10E3/uL (ref 0.7–3.1)
Lymphs: 13 %
MCH: 28 pg (ref 26.6–33.0)
MCHC: 30.6 g/dL — ABNORMAL LOW (ref 31.5–35.7)
MCV: 92 fL (ref 79–97)
Monocytes Absolute: 0.7 x10E3/uL (ref 0.1–0.9)
Monocytes: 6 %
Neutrophils Absolute: 9 x10E3/uL — ABNORMAL HIGH (ref 1.4–7.0)
Neutrophils: 79 %
Platelets: 251 x10E3/uL (ref 150–450)
RBC: 3.68 x10E6/uL — ABNORMAL LOW (ref 3.77–5.28)
RDW: 13 % (ref 11.7–15.4)
WBC: 11.4 x10E3/uL — ABNORMAL HIGH (ref 3.4–10.8)

## 2024-05-29 LAB — TSH: TSH: 1.95 u[IU]/mL (ref 0.450–4.500)

## 2024-05-29 LAB — HEMOGLOBIN A1C
Est. average glucose Bld gHb Est-mCnc: 315 mg/dL
Hgb A1c MFr Bld: 12.6 % — ABNORMAL HIGH (ref 4.8–5.6)

## 2024-05-29 NOTE — Telephone Encounter (Signed)
 Called [facility] to let them know that we got the BUN critical alert. Jodie was notifed.  Dr Sherre was notified.  Copied from CRM [id number]. Topic: Clinical - Lab/Test Results >> May 29, 2024  9:49 AM Marylynn H wrote: Reason for CRM: Dana [name] - [facility] is calling to give a lab alert on the patient. No answer at [facility], Please reach out - [phone] option 1

## 2024-05-31 ENCOUNTER — Other Ambulatory Visit: Payer: Self-pay | Admitting: Family Medicine

## 2024-05-31 ENCOUNTER — Ambulatory Visit: Payer: Self-pay | Admitting: Family Medicine

## 2024-05-31 DIAGNOSIS — I2510 Atherosclerotic heart disease of native coronary artery without angina pectoris: Secondary | ICD-10-CM

## 2024-05-31 DIAGNOSIS — E782 Mixed hyperlipidemia: Secondary | ICD-10-CM

## 2024-06-01 ENCOUNTER — Other Ambulatory Visit: Payer: Self-pay

## 2024-06-01 ENCOUNTER — Other Ambulatory Visit: Payer: Self-pay | Admitting: Family Medicine

## 2024-06-01 ENCOUNTER — Other Ambulatory Visit (HOSPITAL_COMMUNITY): Payer: Self-pay

## 2024-06-01 ENCOUNTER — Inpatient Hospital Stay (HOSPITAL_BASED_OUTPATIENT_CLINIC_OR_DEPARTMENT_OTHER): Admission: RE | Admit: 2024-06-01 | Admitting: Radiology

## 2024-06-01 DIAGNOSIS — G8929 Other chronic pain: Secondary | ICD-10-CM | POA: Diagnosis not present

## 2024-06-01 DIAGNOSIS — M19112 Post-traumatic osteoarthritis, left shoulder: Secondary | ICD-10-CM | POA: Diagnosis not present

## 2024-06-01 DIAGNOSIS — M25512 Pain in left shoulder: Secondary | ICD-10-CM | POA: Diagnosis not present

## 2024-06-01 MED ORDER — PANTOPRAZOLE SODIUM 40 MG PO TBEC
40.0000 mg | DELAYED_RELEASE_TABLET | Freq: Every day | ORAL | 5 refills | Status: AC
  Filled 2024-06-01: qty 30, 30d supply, fill #0
  Filled 2024-06-25: qty 30, 30d supply, fill #1
  Filled 2024-07-28: qty 30, 30d supply, fill #2

## 2024-06-01 MED ORDER — EZETIMIBE 10 MG PO TABS
10.0000 mg | ORAL_TABLET | Freq: Every day | ORAL | 5 refills | Status: AC
  Filled 2024-06-01: qty 30, 30d supply, fill #0
  Filled 2024-06-22 – 2024-06-25 (×2): qty 30, 30d supply, fill #1
  Filled 2024-07-24: qty 30, 30d supply, fill #2

## 2024-06-01 MED ORDER — ALLOPURINOL 100 MG PO TABS
50.0000 mg | ORAL_TABLET | Freq: Every day | ORAL | 5 refills | Status: AC
  Filled 2024-06-01: qty 30, 60d supply, fill #0
  Filled 2024-06-20 – 2024-07-28 (×2): qty 30, 60d supply, fill #1

## 2024-06-01 MED ORDER — REPATHA SURECLICK 140 MG/ML ~~LOC~~ SOAJ
140.0000 mg | SUBCUTANEOUS | 1 refills | Status: AC
  Filled 2024-06-01: qty 6, 84d supply, fill #0
  Filled 2024-06-20: qty 6, 84d supply, fill #1

## 2024-06-01 MED ORDER — BUSPIRONE HCL 15 MG PO TABS
15.0000 mg | ORAL_TABLET | Freq: Three times a day (TID) | ORAL | 5 refills | Status: AC
  Filled 2024-06-01: qty 90, 30d supply, fill #0
  Filled 2024-06-22 – 2024-06-25 (×2): qty 90, 30d supply, fill #1
  Filled 2024-07-28: qty 90, 30d supply, fill #2

## 2024-06-02 ENCOUNTER — Other Ambulatory Visit: Payer: Self-pay

## 2024-06-02 ENCOUNTER — Other Ambulatory Visit (HOSPITAL_COMMUNITY): Payer: Self-pay

## 2024-06-03 ENCOUNTER — Encounter (HOSPITAL_COMMUNITY): Payer: Self-pay

## 2024-06-04 ENCOUNTER — Other Ambulatory Visit (HOSPITAL_COMMUNITY): Payer: Self-pay

## 2024-06-04 ENCOUNTER — Other Ambulatory Visit: Payer: Self-pay

## 2024-06-09 DIAGNOSIS — S82853A Displaced trimalleolar fracture of unspecified lower leg, initial encounter for closed fracture: Secondary | ICD-10-CM | POA: Diagnosis not present

## 2024-06-15 ENCOUNTER — Other Ambulatory Visit: Payer: Self-pay | Admitting: Family Medicine

## 2024-06-15 ENCOUNTER — Ambulatory Visit (HOSPITAL_BASED_OUTPATIENT_CLINIC_OR_DEPARTMENT_OTHER): Admitting: Radiology

## 2024-06-15 ENCOUNTER — Encounter: Payer: Self-pay | Admitting: Family Medicine

## 2024-06-15 ENCOUNTER — Other Ambulatory Visit: Payer: Self-pay

## 2024-06-15 MED ORDER — HYDROCODONE-ACETAMINOPHEN 7.5-325 MG PO TABS
1.0000 | ORAL_TABLET | Freq: Three times a day (TID) | ORAL | 0 refills | Status: AC | PRN
  Filled 2024-07-15: qty 90, 30d supply, fill #0

## 2024-06-15 MED ORDER — HYDROCODONE-ACETAMINOPHEN 7.5-325 MG PO TABS
1.0000 | ORAL_TABLET | Freq: Three times a day (TID) | ORAL | 0 refills | Status: DC | PRN
  Filled 2024-06-15: qty 90, 30d supply, fill #0

## 2024-06-16 ENCOUNTER — Other Ambulatory Visit (HOSPITAL_COMMUNITY): Payer: Self-pay

## 2024-06-16 ENCOUNTER — Telehealth: Payer: Self-pay

## 2024-06-16 DIAGNOSIS — E1142 Type 2 diabetes mellitus with diabetic polyneuropathy: Secondary | ICD-10-CM

## 2024-06-16 DIAGNOSIS — E1042 Type 1 diabetes mellitus with diabetic polyneuropathy: Secondary | ICD-10-CM

## 2024-06-16 NOTE — Progress Notes (Unsigned)
 Complex Care Management Note Care Guide Note  06/16/2024 Name: Dana [name] MRN: [id number] DOB: [birth year]   Complex Care Management Outreach Attempts: An unsuccessful telephone outreach was attempted today to offer the patient information about available complex care management services.  Follow Up Plan:  Additional outreach attempts will be made to offer the patient complex care management information and services.   Encounter Outcome:  Patient Request to Call Back  D[facility]a [facility]  [facility], [facility] VBCI Assistant Direct Dial: [phone]  Fax: [phone]

## 2024-06-17 NOTE — Progress Notes (Signed)
 Complex Care Management Note  Care Guide Note 06/17/2024 Name: Dana [name] MRN: [id number] DOB: 05-Oct-1960  Dana [name] is a 63 y.o. year old female who sees Cox, Kirsten, MD for primary care. I reached out to Dana [name] by phone today to offer complex care management services.  Dana [name] was given information about Complex Care Management services today including:   The Complex Care Management services include support from the care team which includes your Nurse Care Manager, Clinical Social Worker, or Pharmacist.  The Complex Care Management team is here to help remove barriers to the health concerns and goals most important to you. Complex Care Management services are voluntary, and the patient may decline or stop services at any time by request to their care team member.   Complex Care Management Consent Status: Patient agreed to services and verbal consent obtained.   Follow up plan:  Telephone appointment with complex care management team member scheduled for:  07/01/24 at 9:00 a.m.   Encounter Outcome:  Patient Scheduled  [name]  [facility], [facility] VBCI Assistant Direct Dial: [phone]  Fax: [phone]

## 2024-06-18 ENCOUNTER — Other Ambulatory Visit (HOSPITAL_COMMUNITY): Payer: Self-pay

## 2024-06-18 ENCOUNTER — Other Ambulatory Visit: Payer: Self-pay

## 2024-06-19 ENCOUNTER — Inpatient Hospital Stay (HOSPITAL_BASED_OUTPATIENT_CLINIC_OR_DEPARTMENT_OTHER): Admission: RE | Admit: 2024-06-19 | Admitting: Radiology

## 2024-06-19 DIAGNOSIS — G4733 Obstructive sleep apnea (adult) (pediatric): Secondary | ICD-10-CM | POA: Diagnosis not present

## 2024-06-20 ENCOUNTER — Other Ambulatory Visit (HOSPITAL_COMMUNITY): Payer: Self-pay

## 2024-06-22 ENCOUNTER — Other Ambulatory Visit (HOSPITAL_COMMUNITY): Payer: Self-pay

## 2024-06-22 ENCOUNTER — Other Ambulatory Visit: Payer: Self-pay

## 2024-06-23 ENCOUNTER — Encounter (HOSPITAL_BASED_OUTPATIENT_CLINIC_OR_DEPARTMENT_OTHER): Payer: Self-pay

## 2024-06-23 ENCOUNTER — Other Ambulatory Visit (HOSPITAL_COMMUNITY): Payer: Self-pay

## 2024-06-23 ENCOUNTER — Other Ambulatory Visit: Payer: Self-pay

## 2024-06-25 ENCOUNTER — Other Ambulatory Visit (HOSPITAL_COMMUNITY): Payer: Self-pay

## 2024-06-25 ENCOUNTER — Other Ambulatory Visit: Payer: Self-pay

## 2024-06-26 ENCOUNTER — Other Ambulatory Visit: Payer: Self-pay

## 2024-07-01 ENCOUNTER — Ambulatory Visit (HOSPITAL_COMMUNITY)

## 2024-07-01 ENCOUNTER — Telehealth: Payer: Self-pay

## 2024-07-01 ENCOUNTER — Other Ambulatory Visit: Payer: Self-pay

## 2024-07-01 NOTE — Patient Instructions (Signed)
 Visit Information  Thank you for taking time to visit with me today. Please don't hesitate to contact me if I can be of assistance to you before our next scheduled appointment.  Our next appointment is by telephone on 07/14/24 at 10: 30 am.  Please call the care guide team at [phone] if you need to cancel or reschedule your appointment.   Following is a copy of your care plan:   Goals Addressed             This Visit's Progress    VBCI RN Care Plan       Problems:  Chronic Disease Management support and education needs related to DMII  Goal: Over the next 90 days the Patient will continue to work with RN Care Manager and/or Social Worker to address care management and care coordination needs related to DMII as evidenced by adherence to care management team scheduled appointments     demonstrate Improved adherence to prescribed treatment plan for DMII as evidenced by patient reports and/or review of chart  Interventions:   Diabetes Interventions: Assessed patient's understanding of A1c goal: <8% Reviewed medications with patient and discussed importance of medication adherence Discussed plans with patient for ongoing care management follow up and provided patient with direct contact information for care management team Reviewed scheduled/upcoming provider appointments including: Endocrinologist on 07/27/24 Screening for signs and symptoms of depression related to chronic disease state  Assessed social determinant of health barriers RNCM offered diabetes education. Patient declines Lab Results  Component Value Date   HGBA1C 12.6 (H) 05/28/2024    Patient Self-Care Activities:  Attend all scheduled provider appointments Call pharmacy for medication refills 3-7 days in advance of running out of medications Call provider office for new concerns or questions  Take medications as prescribed   Schedule an appointment with dental provider Monitor BS as recommended by provider  and notify endocrinologist if outside recommended range.  Plan:  Telephone follow up appointment with care management team member scheduled for:  07/14/24           Please call the Suicide and Crisis Lifeline: 988 call the [place]  National Suicide Prevention Lifeline: 910-481-0356 or TTY: 432-528-4748 TTY 562-619-2112) to talk to a trained counselor if you are experiencing a Mental Health or Behavioral Health Crisis or need someone to talk to.  Patient verbalized understanding of Care plan and visit instructions communicated this visit  Heddy Shutter, RN, MSN, BSN, CCM [place]  [facility], Population Health Case Manager Phone: [phone]

## 2024-07-01 NOTE — Patient Outreach (Signed)
 Complex Care Management   Visit Note  07/01/2024  Name:  Dana [name] MRN: [id number] DOB: Sep 03, 1960  Situation: Referral received for Complex Care Management related to Diabetes with Complications I obtained verbal consent from Patient.  Visit completed with Patient  on the phone  Background:   Past Medical History:  Diagnosis Date   Acquired absence of left great toe    Acquired absence of other left toe(s)    ACS (acute coronary syndrome) (HCC)    Acute bronchitis due to COVID-19 virus 03/03/2023   Acute on chronic combined systolic and diastolic CHF (congestive heart failure) (HCC) 09/16/2023   Acute respiratory failure with hypoxia (HCC) 07/20/2023   Anemia    Anoxic brain injury (HCC)    Anxiety    CAD (coronary artery disease)    DES mLAD 04/07/15; DES dRCA & DES pPDA 08/30/16; DES mCX & PTCA OM1 09/29/20   Charcot's joint of foot in type 2 diabetes mellitus (HCC) 12/15/2023   Chronic diastolic (congestive) heart failure (HCC)    Chronic pain    Chronic systolic (congestive) heart failure (HCC)    CKD (chronic kidney disease)    Contrast dye induced nephropathy, possible 09/03/2020   COPD (chronic obstructive pulmonary disease) (HCC)    COVID-19 virus infection 03/03/2023   Demand ischemia (HCC)    Diabetes (HCC)type 1    Diastolic CHF (HCC)    Dyspnea    Encounter for screening mammogram for malignant neoplasm of breast 09/16/2023   Family history of breast cancer 10/23/2021   Gastro-esophageal reflux disease without esophagitis    Hyperlipidemia    Hypertension    Hypertensive heart disease with heart failure (HCC)    Hypoxemia    Idiopathic gout, multiple sites    Leukocytosis 03/29/2022   Localized edema    Low back pain    Mitral regurgitation    Mitral regurgitation    Mixed hyperlipidemia    Mixed incontinence    Morbid (severe) obesity due to excess calories (HCC)    Neuromuscular disorder (HCC)    Neuropathy    Non-ST elevation (NSTEMI) myocardial  infarction (HCC)    08/29/20, 09/29/20   OSA (obstructive sleep apnea) 09/03/2020   Other specified hypothyroidism    PAF (paroxysmal atrial fibrillation) (HCC)    s/p pulmonary vein isolation by cryoablation 10/04/15 Dr[facility] Meldon [facility]   PEA (Pulseless electrical activity) [facility])    ~ 01/13/21 at [facility] during admission DKA, volume overload, possible CAP, hypoxia s/p ACLS with ROSC ~ 10-15   Pneumonia    Primary insomnia    Rheumatoid arthritis (HCC)    Stroke (HCC)    Thyroid  disease    Type 1 diabetes mellitus (HCC)    URI with cough and congestion 08/22/2023   Vitamin D  deficiency     Assessment: Patient Reported Symptoms:  Cognitive Cognitive Status: No symptoms reported, Alert and oriented to person, place, and time Cognitive/Intellectual Conditions Management [RPT]: None reported or documented in medical history or problem list   Health Maintenance Behaviors: Annual physical exam, Healthy diet, Exercise Healing Pattern: Average Health Facilitated by: Healthy diet, Pain control, Stress management, Rest  Neurological Neurological Review of Symptoms: Other: Oher Neurological Symptoms/Conditions [RPT]: patient reports historoy of neuropathy. per patient currently at 2/10 on pain scale. But reports time when pain level worsens Neurological Management Strategies: Medication therapy, Routine screening Neurological Self-Management Outcome: 2 (bad) Neurological Comment: chronic neuropathy on both extremities feet/hands.  HEENT HEENT Symptoms Reported: No symptoms reported  Cardiovascular Cardiovascular Symptoms Reported: No symptoms reported Does patient have uncontrolled Hypertension?: No Cardiovascular Management Strategies: Medication therapy, Medical device, Adequate rest, Exercise, Routine screening, Fluid modification, Weight management Do You Have a Working Readable Scale?: Yes Weight: 184 lb (83.5 kg) (per patient weight is adjusted for  prosthesis) Cardiovascular Self-Management Outcome: 5 (very good) Cardiovascular Comment: history of CHF, has cardiomem implanted to  monitor fluid vollume. patient denies any signs or symptoms of fluid volume overload  Respiratory Respiratory Symptoms Reported: No symptoms reported Other Respiratory Symptoms: wears O2 at 2l/[place] at bedtime. denies any    Endocrine Endocrine Symptoms Reported: No symptoms reported Is patient diabetic?: Yes Is patient checking blood sugars at home?: Yes List most recent blood sugar readings, include date and time of day: Freestyle libre. BS this am 97.  A1C last checked was    Gastrointestinal Gastrointestinal Symptoms Reported: No symptoms reported      Genitourinary Genitourinary Symptoms Reported: No symptoms reported    Integumentary Integumentary Symptoms Reported: No symptoms reported    Musculoskeletal Musculoskelatal Symptoms Reviewed: Back pain, Difficulty walking, Limited mobility, Joint pain        Psychosocial Psychosocial Symptoms Reported: Anxiety - if selected complete GAD     Quality of Family Relationships: supportive, involved, helpful Do you feel physically threatened by others?: No       07/01/2024   10:20 AM 02/10/2024   11:48 AM 10/04/2023   10:56 AM 07/29/2023   10:58 AM  GAD 7 : Generalized Anxiety Score  Nervous, Anxious, on Edge 3 3 2 3   Control/stop worrying 3 3 3 3   Worry too much - different things 2 1 3 3   Trouble relaxing 2 2 3 3   Restless 3 2 2 1   Easily annoyed or irritable 1 1 3 1   Afraid - awful might happen 0 2 2 1   Total GAD 7 Score 14 14 18 15   Anxiety Difficulty Somewhat difficult Somewhat difficult Very difficult Somewhat difficult   Today's Vitals   07/01/24 1015  BP: 129/66  Pulse: 60  Weight: 184 lb (83.5 kg)      Medications Reviewed Today     Reviewed by Trishelle Devora M, RN (Registered Nurse) on 07/01/24 at [phone]  Med List Status: <None>   Medication Order Taking? Sig Documenting  Provider Last Dose Status Informant  acetaminophen  (TYLENOL ) 325 MG tablet [id number] Yes Take 2 tablets (650 mg total) by mouth every 6 (six) hours as needed for mild pain (pain score 1-3) or fever (or Fever >/= 101). Rosario Eland I, MD  Active   allopurinol  (ZYLOPRIM ) 100 MG tablet [id number] Yes Take 0.5 tablets (50 mg total) by mouth daily. Sherre Clapper, MD  Active   aspirin  EC 81 MG tablet [id number] Yes Take 1 tablet (81 mg total) by mouth daily. Swallow whole. CoxClapper, MD  Active Self, Pharmacy Records  busPIRone  (BUSPAR ) 15 MG tablet [id number] Yes Take 1 tablet (15 mg total) by mouth 3 (three) times daily. Cox, Kirsten, MD  Active   carvedilol  (COREG ) 6.25 MG tablet [id number] Yes Take 6.25 mg by mouth 2 (two) times daily with a meal. [provider]  Active   cephALEXin  (KEFLEX ) 250 MG capsule [id number]  Take 1 capsule (250 mg total) by mouth 2 (two) times daily.  Patient not taking: Reported on 07/01/2024   Sherre Clapper, MD  Active   Continuous Glucose Receiver (FREESTYLE LIBRE 2 READER) DEVI [id number] Yes Use to monitor blood sugars continuously daily E10.65   Active  Continuous Glucose Sensor (FREESTYLE LIBRE 3 PLUS SENSOR) MISC [id number]  Change sensor every 15 days. Cox, Kirsten, MD  Active   diclofenac  Sodium (VOLTAREN ) 1 % GEL [id number] Yes Apply 2 g topically 4 (four) times daily. Pegge Toribio PARAS, PA-C  Active   Evolocumab  (REPATHA  SURECLICK) 140 MG/ML EMMANUEL [id number] Yes Inject 140 mg into the skin every 14 (fourteen) days. Cox, Kirsten, MD  Active   ezetimibe  (ZETIA ) 10 MG tablet [id number] Yes Take 1 tablet (10 mg total) by mouth daily. Cox, Kirsten, MD  Active   Furosemide  (FUROSCIX ) 80 MG/10ML CTKT [id number] Yes Inject 80 mg into the skin as directed. Marcine Catalan M, PA-C  Active   gabapentin  (NEURONTIN ) 300 MG capsule [id number] Yes Take 1 capsule (300 mg total) by mouth 2 (two) times daily. Cox, Kirsten, MD  Active   Glucagon  (GVOKE HYPOPEN  2-PACK) 0.5  MG/0.1ML EMMANUEL [id number] Yes Inject 0.5 mg into the skin daily as needed. CoxAbigail, MD  Active Self, Pharmacy Records           Med Note IVORY, JASMINE N   Tue Oct 15, 2023 10:07 AM)    HYDROcodone -acetaminophen  (NORCO) 7.5-325 MG tablet [id number] Yes Take 1 tablet by mouth 3 (three) times daily as needed. Cox, Kirsten, MD  Active   HYDROcodone -acetaminophen  [facility]) 7.5-325 MG tablet [id number]  Take 1 tablet by mouth 3 (three) times daily as needed. Cox, Kirsten, MD  Active   insulin  aspart (NOVOLOG  FLEXPEN) 100 UNIT/ML FlexPen [id number] Yes Inject 2 - 10 Units before meals based on SSI. Cox, Kirsten, MD  Active   insulin  glargine (LANTUS  SOLOSTAR) 100 UNIT/ML Solostar Pen [id number] Yes Inject 30 units subcutaneously daily and increase as directed - max daily dose is 50 units   Active   Insulin  Pen Needle (BD PEN NEEDLE NANO 2ND GEN) 32G X 4 MM MISC [id number]  Inject 1 each into the skin in the morning, at noon, in the evening, and at bedtime. Sherre Abigail, MD  Active Self, Pharmacy Records  Insulin  Pen Needle 32G X 4 MM MISC [id number]  Use with Lantus  pen Pegge Toribio PARAS, PA-C  Active   latanoprost  (XALATAN ) 0.005 % ophthalmic solution [id number] Yes Place 1 drop into both eyes at bedtime. Sherre Abigail, MD  Active Self, Pharmacy Records  lidocaine  4 % [id number] Yes Place 1 patch onto the skin daily. Remove & Discard patch within 12 hours or as directed by MD Angiulli, Toribio PARAS, PA-C  Active            Med Note ANNELL, JEANNINE HERO   Fri Mar 13, 2024 11:08 AM) As needed  linaclotide  (LINZESS ) 145 MCG CAPS capsule [id number] Yes Take 1 capsule (145 mcg total) by mouth daily before breakfast. Pegge Toribio PARAS, PA-C  Active            Med Note ANNELL, JEANNINE HERO   Fri Mar 13, 2024 11:08 AM) As needed  Menthol -Methyl Salicylate  (ICY HOT EXTRA STRENGTH) 10-30 % CREA [id number]  Apply 1 Application topically as needed (bilateral hips and knees).  Patient not taking: Reported on 07/01/2024   Nicholaus Credit, PA-C  Active Self, Pharmacy Records  nitroGLYCERIN  (NITROSTAT ) 0.4 MG SL tablet [id number] Yes DISSOLVE ONE TABLET UNDER THE TONGUE EVERY 5 MINUTES AS NEEDED FOR CHEST PAIN.  DO NOT EXCEED A TOTAL OF 3 DOSES IN 15 MINUTES Cox, Kirsten, MD  Active   NOVOLOG  FLEXPEN 100 UNIT/ML FlexPen [id number]  Inject 10 units subcutaneously three times daily with meals and  adjust as directed - max daily dose is 50 units  Patient not taking: Reported on 07/01/2024     Active   OXYGEN  [id number] Yes Inhale 2 L/min into the lungs at bedtime. [provider]  Active Self, Pharmacy Records  pantoprazole  (PROTONIX ) 40 MG tablet [id number] Yes Take 1 tablet (40 mg total) by mouth daily. Cox, Kirsten, MD  Active   ticagrelor  (BRILINTA ) 90 MG TABS tablet [id number] Yes Take 1 tablet (90 mg total) by mouth 2 (two) times daily. Angiulli, Daniel J, PA-C  Active   torsemide  (DEMADEX ) 20 MG tablet [id number] Yes Take 3 tablets (60 mg total) by mouth daily. Marcine Catalan M, PA-C  Active   Vitamin D , Ergocalciferol , (DRISDOL ) 1.25 MG (50000 UNIT) CAPS capsule [id number] Yes Take 1 capsule (50,000 Units total) by mouth every 7 (seven) days. Sherre Clapper, MD  Active           Recommendation:   Specialty provider follow-up with endocrinologist as scheduled  Follow Up Plan:   Telephone follow up appointment date/time:  07/14/24 at 10:30 am.  Heddy Shutter, RN, MSN, BSN, CCM [place]  [facility], Population Health Case Manager Phone: [phone]

## 2024-07-06 ENCOUNTER — Other Ambulatory Visit (HOSPITAL_COMMUNITY): Payer: Self-pay

## 2024-07-14 ENCOUNTER — Other Ambulatory Visit: Payer: Self-pay | Admitting: Family Medicine

## 2024-07-14 ENCOUNTER — Other Ambulatory Visit (HOSPITAL_COMMUNITY): Payer: Self-pay | Admitting: Cardiology

## 2024-07-14 ENCOUNTER — Other Ambulatory Visit: Payer: Self-pay

## 2024-07-14 ENCOUNTER — Other Ambulatory Visit (HOSPITAL_COMMUNITY): Payer: Self-pay

## 2024-07-14 MED ORDER — TORSEMIDE 20 MG PO TABS
60.0000 mg | ORAL_TABLET | Freq: Every day | ORAL | 0 refills | Status: DC
  Filled 2024-07-14 – 2024-07-17 (×2): qty 30, 10d supply, fill #0

## 2024-07-14 MED ORDER — GVOKE HYPOPEN 2-PACK 0.5 MG/0.1ML ~~LOC~~ SOAJ
0.5000 mg | Freq: Every day | SUBCUTANEOUS | 5 refills | Status: AC | PRN
  Filled 2024-07-14: qty 0.2, 30d supply, fill #0

## 2024-07-14 MED ORDER — LATANOPROST 0.005 % OP SOLN
1.0000 [drp] | Freq: Every day | OPHTHALMIC | 0 refills | Status: AC
  Filled 2024-07-14: qty 2.5, 25d supply, fill #0

## 2024-07-14 NOTE — Patient Outreach (Signed)
 Complex Care Management   Visit Note  07/14/2024  Name:  Dana [name] MRN: [id number] DOB: May 10, 1961  Situation: Referral received for Complex Care Management related to Diabetes with Complications I obtained verbal consent from Patient.  Visit completed with Patient  on the phone  Background:   Past Medical History:  Diagnosis Date   Acquired absence of left great toe    Acquired absence of other left toe(s)    ACS (acute coronary syndrome) (HCC)    Acute bronchitis due to COVID-19 virus 03/03/2023   Acute on chronic combined systolic and diastolic CHF (congestive heart failure) (HCC) 09/16/2023   Acute respiratory failure with hypoxia (HCC) 07/20/2023   Anemia    Anoxic brain injury (HCC)    Anxiety    CAD (coronary artery disease)    DES mLAD 04/07/15; DES dRCA & DES pPDA 08/30/16; DES mCX & PTCA OM1 09/29/20   Charcot's joint of foot in type 2 diabetes mellitus (HCC) 12/15/2023   Chronic diastolic (congestive) heart failure (HCC)    Chronic pain    Chronic systolic (congestive) heart failure (HCC)    CKD (chronic kidney disease)    Contrast dye induced nephropathy, possible 09/03/2020   COPD (chronic obstructive pulmonary disease) (HCC)    COVID-19 virus infection 03/03/2023   Demand ischemia (HCC)    Diabetes (HCC)type 1    Diastolic CHF (HCC)    Dyspnea    Encounter for screening mammogram for malignant neoplasm of breast 09/16/2023   Family history of breast cancer 10/23/2021   Gastro-esophageal reflux disease without esophagitis    Hyperlipidemia    Hypertension    Hypertensive heart disease with heart failure (HCC)    Hypoxemia    Idiopathic gout, multiple sites    Leukocytosis 03/29/2022   Localized edema    Low back pain    Mitral regurgitation    Mitral regurgitation    Mixed hyperlipidemia    Mixed incontinence    Morbid (severe) obesity due to excess calories (HCC)    Neuromuscular disorder (HCC)    Neuropathy    Non-ST elevation (NSTEMI) myocardial  infarction (HCC)    08/29/20, 09/29/20   OSA (obstructive sleep apnea) 09/03/2020   Other specified hypothyroidism    PAF (paroxysmal atrial fibrillation) (HCC)    s/p pulmonary vein isolation by cryoablation 10/04/15 Dr. Meldon in [facility]   PEA (Pulseless electrical activity) [facility])    ~ 01/13/21 at [facility] during admission DKA, volume overload, possible CAP, hypoxia s/p ACLS with ROSC ~ 10-15   Pneumonia    Primary insomnia    Rheumatoid arthritis (HCC)    Stroke (HCC)    Thyroid  disease    Type 1 diabetes mellitus (HCC)    URI with cough and congestion 08/22/2023   Vitamin D  deficiency     Assessment: Patient Reported Symptoms:  Cognitive Cognitive Status: No symptoms reported      Neurological Oher Neurological Symptoms/Conditions [RPT]: history of neuropathy both legs knees down/hands 7/10. managed with medication    HEENT HEENT Symptoms Reported: No symptoms reported      Cardiovascular Cardiovascular Symptoms Reported: No symptoms reported Does patient have uncontrolled Hypertension?: No    Respiratory Respiratory Symptoms Reported: No symptoms reported Other Respiratory Symptoms: continues to wear O2 at 2l/[place] at bedtime.    Endocrine Endocrine Symptoms Reported: No symptoms reported Is patient diabetic?: Yes Is patient checking blood sugars at home?: Yes List most recent blood sugar readings, include date and time of day: BS  92 fasting this morning and states BS has ranged 80-289. denies any hypoglycemic events. Endocrine Self-Management Outcome: 3 (uncertain)  Gastrointestinal Gastrointestinal Symptoms Reported: No symptoms reported      Genitourinary Genitourinary Symptoms Reported: No symptoms reported    Integumentary Integumentary Symptoms Reported: No symptoms reported    Musculoskeletal Musculoskelatal Symptoms Reviewed: Limited mobility, Difficulty walking Additional Musculoskeletal Details: mobility getting better every day, bath/dress self.  lost part of her leg        Psychosocial Psychosocial Symptoms Reported: Anxiety - if selected complete GAD Additional Psychological Details: currently seeing a psychologist every two weeks and as needed.          There were no vitals filed for this visit. Pain Scale: 0-10 Pain Score: 7  Pain Location: Hand (per patient, neuropathy in both hands and legs) Pain Descriptors / Indicators: Burning, Tingling Pain Onset: On-going Patients Stated Pain Goal: 4  Medications Reviewed Today     Reviewed by Joseguadalupe Stan M, RN (Registered Nurse) on 07/14/24 at 1059  Med List Status: <None>   Medication Order Taking? Sig Documenting Provider Last Dose Status Informant  acetaminophen  (TYLENOL ) 325 MG tablet [id number] Yes Take 2 tablets (650 mg total) by mouth every 6 (six) hours as needed for mild pain (pain score 1-3) or fever (or Fever >/= 101). Rosario Eland I, MD  Active   allopurinol  (ZYLOPRIM ) 100 MG tablet [id number] Yes Take 0.5 tablets (50 mg total) by mouth daily. Sherre Clapper, MD  Active   aspirin  EC 81 MG tablet [id number] Yes Take 1 tablet (81 mg total) by mouth daily. Swallow whole. Cox, Kirsten, MD  Active Self, Pharmacy Records  busPIRone  (BUSPAR ) 15 MG tablet [id number] Yes Take 1 tablet (15 mg total) by mouth 3 (three) times daily. Cox, Kirsten, MD  Active   carvedilol  (COREG ) 6.25 MG tablet [id number] Yes Take 6.25 mg by mouth 2 (two) times daily with a meal. [provider]  Active   cephALEXin  (KEFLEX ) 250 MG capsule [id number]  Take 1 capsule (250 mg total) by mouth 2 (two) times daily.  Patient not taking: Reported on 07/01/2024   Sherre Clapper, MD  Active   Continuous Glucose Receiver (FREESTYLE LIBRE 2 READER) DEVI [id number] Yes Use to monitor blood sugars continuously daily E10.65   Active   Continuous Glucose Sensor (FREESTYLE LIBRE 3 PLUS SENSOR) MISC [id number]  Change sensor every 15 days. Cox, Kirsten, MD  Active   diclofenac  Sodium (VOLTAREN ) 1 % GEL  [id number]  Apply 2 g topically 4 (four) times daily. Pegge Toribio PARAS, PA-C  Active   Evolocumab  (REPATHA  SURECLICK) 140 MG/ML EMMANUEL [id number] Yes Inject 140 mg into the skin every 14 (fourteen) days. Cox, Kirsten, MD  Active   ezetimibe  (ZETIA ) 10 MG tablet [id number] Yes Take 1 tablet (10 mg total) by mouth daily. Cox, Kirsten, MD  Active   Furosemide  (FUROSCIX ) 80 MG/10ML CTKT [id number] Yes Inject 80 mg into the skin as directed. Marcine Catalan M, PA-C  Active   gabapentin  (NEURONTIN ) 300 MG capsule [id number] Yes Take 1 capsule (300 mg total) by mouth 2 (two) times daily. Cox, Kirsten, MD  Active   Glucagon  (GVOKE HYPOPEN  2-PACK) 0.5 MG/0.1ML EMMANUEL [id number] Yes Inject 0.5 mg into the skin daily as needed. Cox, Kirsten, MD  Active   HYDROcodone -acetaminophen  (NORCO) 7.5-325 MG tablet [id number] Yes Take 1 tablet by mouth 3 (three) times daily as needed. Cox, Kirsten, MD  Active   HYDROcodone -acetaminophen  (NORCO) 7.5-325 MG tablet [id number]  Take 1 tablet by  mouth 3 (three) times daily as needed. Cox, Kirsten, MD  Active   insulin  aspart (NOVOLOG  FLEXPEN) 100 UNIT/ML FlexPen [id number] Yes Inject 2 - 10 Units before meals based on SSI. Cox, Kirsten, MD  Active   insulin  glargine (LANTUS  SOLOSTAR) 100 UNIT/ML Solostar Pen [id number] Yes Inject 30 units subcutaneously daily and increase as directed - max daily dose is 50 units   Active   Insulin  Pen Needle (BD PEN NEEDLE NANO 2ND GEN) 32G X 4 MM MISC [id number]  Inject 1 each into the skin in the morning, at noon, in the evening, and at bedtime. Sherre Clapper, MD  Active Self, Pharmacy Records  Insulin  Pen Needle 32G X 4 MM MISC [id number]  Use with Lantus  pen Pegge Toribio PARAS, PA-C  Active   latanoprost  (XALATAN ) 0.005 % ophthalmic solution [id number] Yes Place 1 drop into both eyes at bedtime. Sherre Clapper, MD  Active   lidocaine  4 % [id number] Yes Place 1 patch onto the skin daily. Remove & Discard patch within 12 hours or as directed by MD Angiulli,  Toribio PARAS, PA-C  Active            Med Note ANNELL, JEANNINE HERO   Fri Mar 13, 2024 11:08 AM) As needed  linaclotide  (LINZESS ) 145 MCG CAPS capsule [id number] Yes Take 1 capsule (145 mcg total) by mouth daily before breakfast. Pegge Toribio PARAS, PA-C  Active            Med Note ANNELL, JEANNINE HERO   Fri Mar 13, 2024 11:08 AM) As needed  Menthol -Methyl Salicylate  (ICY HOT EXTRA STRENGTH) 10-30 % CREA [id number]  Apply 1 Application topically as needed (bilateral hips and knees).  Patient not taking: Reported on 07/01/2024   Nicholaus Credit, PA-C  Active Self, Pharmacy Records  nitroGLYCERIN  (NITROSTAT ) 0.4 MG SL tablet [id number] Yes DISSOLVE ONE TABLET UNDER THE TONGUE EVERY 5 MINUTES AS NEEDED FOR CHEST PAIN.  DO NOT EXCEED A TOTAL OF 3 DOSES IN 15 MINUTES Cox, Kirsten, MD  Active   NOVOLOG  FLEXPEN 100 UNIT/ML FlexPen [id number]  Inject 10 units subcutaneously three times daily with meals and adjust as directed - max daily dose is 50 units  Patient not taking: Reported on 07/14/2024     Active   OXYGEN  [id number] Yes Inhale 2 L/min into the lungs at bedtime. [provider]  Active Self, Pharmacy Records  pantoprazole  (PROTONIX ) 40 MG tablet [id number] Yes Take 1 tablet (40 mg total) by mouth daily. Cox, Kirsten, MD  Active   ticagrelor  (BRILINTA ) 90 MG TABS tablet [id number] Yes Take 1 tablet (90 mg total) by mouth 2 (two) times daily. Pegge Toribio PARAS, PA-C  Active   torsemide  (DEMADEX ) 20 MG tablet [id number] Yes Take 3 tablets (60 mg total) by mouth daily. Marcine Catalan M, PA-C  Active   Vitamin D , Ergocalciferol , (DRISDOL ) 1.25 MG (50000 UNIT) CAPS capsule [id number] Yes Take 1 capsule (50,000 Units total) by mouth every 7 (seven) days. Sherre Clapper, MD  Active           Recommendation:   Continue Current Plan of Care  Follow Up Plan:   Telephone follow up appointment date/time:  08/14/24 at 2:00 pm  Heddy Shutter, RN, MSN, BSN, CCM [place]  [facility]  Population Health Case Manager Phone: [phone]

## 2024-07-14 NOTE — Patient Instructions (Signed)
 Visit Information  Thank you for taking time to visit with me today. Please don't hesitate to contact me if I can be of assistance to you before our next scheduled appointment.  Your next care management appointment is by telephone on 08/14/24 at 2:00 pm  Please call the care guide team at [phone] if you need to cancel, schedule, or reschedule an appointment.   Please call the Suicide and Crisis Lifeline: 988 call the [place]  National Suicide Prevention Lifeline: (724)608-4201 or TTY: (873)695-6850 TTY 431-324-8794) to talk to a trained counselor call 1-800-273-TALK (toll free, 24 hour hotline) if you are experiencing a Mental Health or Behavioral Health Crisis or need someone to talk to.  Heddy Shutter, RN, MSN, BSN, CCM [place]  [facility], Population Health Case Manager Phone: [phone]

## 2024-07-15 ENCOUNTER — Other Ambulatory Visit (HOSPITAL_COMMUNITY): Payer: Self-pay

## 2024-07-15 ENCOUNTER — Other Ambulatory Visit: Payer: Self-pay

## 2024-07-15 ENCOUNTER — Encounter: Payer: Self-pay | Admitting: Family

## 2024-07-17 ENCOUNTER — Other Ambulatory Visit (HOSPITAL_COMMUNITY): Payer: Self-pay

## 2024-07-17 ENCOUNTER — Other Ambulatory Visit: Payer: Self-pay

## 2024-07-20 ENCOUNTER — Other Ambulatory Visit (HOSPITAL_COMMUNITY): Payer: Self-pay

## 2024-07-20 MED ORDER — FREESTYLE LIBRE 3 READER DEVI
0 refills | Status: AC
  Filled 2024-07-24: qty 1, 90d supply, fill #0

## 2024-07-21 ENCOUNTER — Other Ambulatory Visit (HOSPITAL_COMMUNITY): Payer: Self-pay

## 2024-07-22 ENCOUNTER — Other Ambulatory Visit: Payer: Self-pay

## 2024-07-22 ENCOUNTER — Encounter (HOSPITAL_COMMUNITY): Payer: Self-pay

## 2024-07-22 ENCOUNTER — Ambulatory Visit (HOSPITAL_COMMUNITY): Payer: Self-pay | Admitting: Adult Health

## 2024-07-22 ENCOUNTER — Other Ambulatory Visit (HOSPITAL_COMMUNITY): Payer: Self-pay

## 2024-07-22 ENCOUNTER — Ambulatory Visit (HOSPITAL_COMMUNITY)
Admission: RE | Admit: 2024-07-22 | Discharge: 2024-07-22 | Disposition: A | Discharge status: Home / Self Care | Source: Ambulatory Visit | Attending: Adult Health | Admitting: Adult Health

## 2024-07-22 VITALS — BP 104/69 | HR 66 | Ht 64.5 in | Wt 184.4 lb

## 2024-07-22 DIAGNOSIS — N184 Chronic kidney disease, stage 4 (severe): Secondary | ICD-10-CM | POA: Insufficient documentation

## 2024-07-22 DIAGNOSIS — I252 Old myocardial infarction: Secondary | ICD-10-CM | POA: Insufficient documentation

## 2024-07-22 DIAGNOSIS — Z7982 Long term (current) use of aspirin: Secondary | ICD-10-CM | POA: Diagnosis not present

## 2024-07-22 DIAGNOSIS — I480 Paroxysmal atrial fibrillation: Secondary | ICD-10-CM | POA: Diagnosis not present

## 2024-07-22 DIAGNOSIS — I130 Hypertensive heart and chronic kidney disease with heart failure and stage 1 through stage 4 chronic kidney disease, or unspecified chronic kidney disease: Secondary | ICD-10-CM | POA: Diagnosis not present

## 2024-07-22 DIAGNOSIS — Z794 Long term (current) use of insulin: Secondary | ICD-10-CM | POA: Diagnosis not present

## 2024-07-22 DIAGNOSIS — G4733 Obstructive sleep apnea (adult) (pediatric): Secondary | ICD-10-CM | POA: Insufficient documentation

## 2024-07-22 DIAGNOSIS — I5032 Chronic diastolic (congestive) heart failure: Secondary | ICD-10-CM | POA: Insufficient documentation

## 2024-07-22 DIAGNOSIS — Z79899 Other long term (current) drug therapy: Secondary | ICD-10-CM | POA: Diagnosis not present

## 2024-07-22 DIAGNOSIS — E1022 Type 1 diabetes mellitus with diabetic chronic kidney disease: Secondary | ICD-10-CM | POA: Diagnosis not present

## 2024-07-22 DIAGNOSIS — Z87440 Personal history of urinary (tract) infections: Secondary | ICD-10-CM | POA: Insufficient documentation

## 2024-07-22 DIAGNOSIS — I2510 Atherosclerotic heart disease of native coronary artery without angina pectoris: Secondary | ICD-10-CM | POA: Insufficient documentation

## 2024-07-22 DIAGNOSIS — Z7902 Long term (current) use of antithrombotics/antiplatelets: Secondary | ICD-10-CM | POA: Diagnosis not present

## 2024-07-22 DIAGNOSIS — Z955 Presence of coronary angioplasty implant and graft: Secondary | ICD-10-CM | POA: Diagnosis not present

## 2024-07-22 LAB — BASIC METABOLIC PANEL WITH GFR
Anion gap: 12 (ref 5–15)
BUN: 81 mg/dL — ABNORMAL HIGH (ref 8–23)
CO2: 25 mmol/L (ref 22–32)
Calcium: 9.1 mg/dL (ref 8.9–10.3)
Chloride: 94 mmol/L — ABNORMAL LOW (ref 98–111)
Creatinine, Ser: 2.85 mg/dL — ABNORMAL HIGH (ref 0.44–1.00)
GFR, Estimated: 18 mL/min — ABNORMAL LOW
Glucose, Bld: 267 mg/dL — ABNORMAL HIGH (ref 70–99)
Potassium: 4.1 mmol/L (ref 3.5–5.1)
Sodium: 131 mmol/L — ABNORMAL LOW (ref 135–145)

## 2024-07-22 MED ORDER — TORSEMIDE 20 MG PO TABS
60.0000 mg | ORAL_TABLET | Freq: Every day | ORAL | 5 refills | Status: AC
  Filled 2024-07-22 – 2024-08-05 (×2): qty 90, 30d supply, fill #0

## 2024-07-22 NOTE — Patient Instructions (Signed)
 Medication Changes:  No Changes In Medications at this time.   Lab Work:  Labs done today, your results will be available in MyChart, we will contact you for abnormal readings.  Follow-Up in: 3-4 MONTHS WITH DR. CHERRIE PLEASE CALL OUR OFFICE AROUND MARCH 2026 TO GET SCHEDULED FOR YOUR APPOINTMENT. PHONE NUMBER IS [phone] OPTION 2   At the [facility], you and your health needs are our priority. We have a designated team specialized in the treatment of Heart Failure. This Care Team includes your primary Heart Failure Specialized Cardiologist (physician), Advanced Practice Providers (APPs- Physician Assistants and Nurse Practitioners), and Pharmacist who all work together to provide you with the care you need, when you need it.   You may see any of the following providers on your designated Care Team at your next follow up:  Dr. Toribio Cherrie Dr. Ezra Shuck Dr. Odis Brownie Greig Mosses, NP Caffie Shed, [place] [facility], [place] Beckey Coe, NP Jordan Lee, NP Tinnie Redman, PharmD   Please be sure to bring in all your medications bottles to every appointment.   Need to Contact Us :  If you have any questions or concerns before your next appointment please send us  a message through Kite or call our office at [phone].    TO LEAVE A MESSAGE FOR THE NURSE SELECT OPTION 2, PLEASE LEAVE A MESSAGE INCLUDING: YOUR NAME DATE OF BIRTH CALL BACK NUMBER REASON FOR CALL**this is important as we prioritize the call backs  YOU WILL RECEIVE A CALL BACK THE SAME DAY AS LONG AS YOU CALL BEFORE 4:00 PM

## 2024-07-22 NOTE — Progress Notes (Signed)
 "  ADVANCED HF CLINIC NOTE   PCP: Sherre Clapper, MD HF Cardiologist: Toribio Fuel, MD   HPI: [name] is 64 y.o. female with history of diastolic heart failure s/p cardiomems 02/2018, CAD, s/p DES RCA and LAD 2018, DMI, HTN, HLD, PAD, and OSA on CPAP, PAF s/p ablation, PAD s/p L great toe and 4th toe amputation, stage IIIb CKD, PEA arrest in 12/2020, severe MR s/p mTEER in 2022.   LHC 3/22 showed moderate mid-LAD and mid-RCA disease, severe mid-L-Cx stenosis s/p PCI/DES mid Cx and side branch angioplasty of OM1.  10/22 she was enrolled in FASTR trial and diuresed > 15 lb. RHC 10/22 showed mild to moderate elevating filling pressures with large v-waves in PCWP tracing suggesting severe MR vs severe diastolic dysfunction. TEE 55-60% showed severe central MR she underwent TEER of mitral valve with placement of 1XT W and 1NT W clip on 05/2021. NT W clip was entangled and had to be deployed into lateral part of valve. No additional clip placement could be preformed. Echo 11/04 with EF 50-55% and residual moderate MR.   Multiple admissions over 2024-2025 related to ACS/CP/NSTEMI requiring LHC and PCI (see cardiac studies below). 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 Has narrowly missed HD during multiple admissions.   S/P L BKA 01/2024.   Today she returns for HF follow up with her husband. Overall feeling fine. She is working hard to get out of her wheelchair and walk more. Denies SOB/PND/Orthopnea. Complaining of nasal congestion. More active than previously and has been losing weight as a result.  Appetite ok. No fever or chills. Taking all medications. Of note she took Furoscix  07/21/2023. Her Mom lives with her/husband. She helps take care of her mother.    Cardiac Studies: - TEE 6/25: LVEF 50-55%, RV ok, no clot, severe MR, mitra clip present  - Echo 12/24: EF 55-60%, RV normal, MVg unchanged at 5 mmHg  - LHC (1/25): PTCA to prox LAD  - LHC (12/24): multivessel CAD. Proximal  LAD restenosis  - LHC (8/24):  PTCA/DES x 1 proximal LAD, chronic occlusion mid Circ beyond the stented segment, patent obtuse marginal branch with severe distal stenosis (too small for PCI), dominant RCA with moderate proximal stenosis, patent distal stent, LVEDP 19 mmhg.  - Echo (5/24): EF 50-55%. RV mildly reduced, post mitral clip x 2 with XTW and NTW the latter entangled in chords, no obvious SLD noted, mean gradient 5 mmHg, mild residual MR appears medial to the two clips.  - LHC (5/24): DES to LAD, osial prox RCA with severe 85% stenosis-->will need staged intervention when renal function stabilized.  - LHC (2/24): 2v obstructive CAD. New 90% stenosis pLAD with RFR 0.48. The stent in the LCx is occluded after a large first OM. Patent stent in the distal RCA. S/p PCI of pLAD with DES x 1 overlapping with prior stent.  - Echo (1/24): EF 55-60%, normal RV and moderate MR, mG 7.5 mmHg, essentially unchanged from prior echo.   Review of systems complete and found to be negative unless listed in HPI.    Past Medical History:  Diagnosis Date   Acquired absence of left great toe    Acquired absence of other left toe(s)    ACS (acute coronary syndrome) (HCC)    Acute bronchitis due to COVID-19 virus 03/03/2023   Acute on chronic combined systolic and diastolic CHF (congestive heart failure) (HCC) 09/16/2023   Acute respiratory failure with hypoxia (HCC) 07/20/2023  Anemia    Anoxic brain injury (HCC)    Anxiety    CAD (coronary artery disease)    DES mLAD 04/07/15; DES dRCA & DES pPDA 08/30/16; DES mCX & PTCA OM1 09/29/20   Charcot's joint of foot in type 2 diabetes mellitus (HCC) 12/15/2023   Chronic diastolic (congestive) heart failure (HCC)    Chronic pain    Chronic systolic (congestive) heart failure (HCC)    CKD (chronic kidney disease)    Contrast dye induced nephropathy, possible 09/03/2020   COPD (chronic obstructive pulmonary disease) (HCC)    COVID-19 virus infection  03/03/2023   Demand ischemia (HCC)    Diabetes (HCC)type 1    Diastolic CHF (HCC)    Dyspnea    Encounter for screening mammogram for malignant neoplasm of breast 09/16/2023   Family history of breast cancer 10/23/2021   Gastro-esophageal reflux disease without esophagitis    Hyperlipidemia    Hypertension    Hypertensive heart disease with heart failure (HCC)    Hypoxemia    Idiopathic gout, multiple sites    Leukocytosis 03/29/2022   Localized edema    Low back pain    Mitral regurgitation    Mitral regurgitation    Mixed hyperlipidemia    Mixed incontinence    Morbid (severe) obesity due to excess calories (HCC)    Neuromuscular disorder (HCC)    Neuropathy    Non-ST elevation (NSTEMI) myocardial infarction (HCC)    08/29/20, 09/29/20   OSA (obstructive sleep apnea) 09/03/2020   Other specified hypothyroidism    PAF (paroxysmal atrial fibrillation) (HCC)    s/p pulmonary vein isolation by cryoablation 10/04/15 Dr. Meldon in [facility]   PEA (Pulseless electrical activity) [name])    ~ 01/13/21 at [facility] during admission DKA, volume overload, possible CAP, hypoxia s/p ACLS with ROSC ~ 10-15   Pneumonia    Primary insomnia    Rheumatoid arthritis (HCC)    Stroke (HCC)    Thyroid  disease    Type 1 diabetes mellitus (HCC)    URI with cough and congestion 08/22/2023   Vitamin D  deficiency    Current Outpatient Medications  Medication Sig Dispense Refill   acetaminophen  (TYLENOL ) 325 MG tablet Take 2 tablets (650 mg total) by mouth every 6 (six) hours as needed for mild pain (pain score 1-3) or fever (or Fever >/= 101).     allopurinol  (ZYLOPRIM ) 100 MG tablet Take 0.5 tablets (50 mg total) by mouth daily. 30 tablet 5   aspirin  EC 81 MG tablet Take 1 tablet (81 mg total) by mouth daily. Swallow whole. 30 tablet 12   busPIRone  (BUSPAR ) 15 MG tablet Take 1 tablet (15 mg total) by mouth 3 (three) times daily. 90 tablet 5   carvedilol  (COREG ) 6.25 MG tablet Take 6.25 mg by  mouth 2 (two) times daily with a meal.     Continuous Glucose Receiver (FREESTYLE LIBRE 2 READER) DEVI Use to monitor blood sugars continuously daily E10.65 1 each 0   Continuous Glucose Receiver (FREESTYLE LIBRE 3 READER) DEVI Use as directed with compatible sensors to monitor blood sugar levels. 1 each 0   Continuous Glucose Sensor (FREESTYLE LIBRE 3 PLUS SENSOR) MISC Change sensor every 15 days. 6 each 0   diclofenac  Sodium (VOLTAREN ) 1 % GEL Apply 2 g topically 4 (four) times daily. 100 g 0   Evolocumab  (REPATHA  SURECLICK) 140 MG/ML SOAJ Inject 140 mg into the skin every 14 (fourteen) days. 6 mL 1   ezetimibe  (ZETIA )  10 MG tablet Take 1 tablet (10 mg total) by mouth daily. 30 tablet 5   Furosemide  (FUROSCIX ) 80 MG/10ML CTKT Inject 80 mg into the skin as directed. 4 each 3   gabapentin  (NEURONTIN ) 300 MG capsule Take 1 capsule (300 mg total) by mouth 2 (two) times daily. 180 capsule 3   Glucagon  (GVOKE HYPOPEN  2-PACK) 0.5 MG/0.1ML SOAJ Inject 0.5 mg into the skin daily as needed. 0.2 mL 5   HYDROcodone -acetaminophen  (NORCO) 7.5-325 MG tablet Take 1 tablet by mouth 3 (three) times daily as needed. 90 tablet 0   insulin  aspart (NOVOLOG  FLEXPEN) 100 UNIT/ML FlexPen Inject 2 - 10 Units before meals based on SSI. 15 mL 3   insulin  glargine (LANTUS  SOLOSTAR) 100 UNIT/ML Solostar Pen Inject 30 units subcutaneously daily and increase as directed - max daily dose is 50 units 15 mL 5   Insulin  Pen Needle (BD PEN NEEDLE NANO 2ND GEN) 32G X 4 MM MISC Inject 1 each into the skin in the morning, at noon, in the evening, and at bedtime. 600 each 3   Insulin  Pen Needle 32G X 4 MM MISC Use with Lantus  pen 100 each 0   latanoprost  (XALATAN ) 0.005 % ophthalmic solution Place 1 drop into both eyes at bedtime. 2.5 mL 0   lidocaine  4 % Place 1 patch onto the skin daily. Remove & Discard patch within 12 hours or as directed by MD 12 patch 0   linaclotide  (LINZESS ) 145 MCG CAPS capsule Take 1 capsule (145 mcg total) by  mouth daily before breakfast. 30 capsule 0   Menthol -Methyl Salicylate  (ICY HOT EXTRA STRENGTH) 10-30 % CREA Apply 1 Application topically as needed (bilateral hips and knees). 85 g 1   nitroGLYCERIN  (NITROSTAT ) 0.4 MG SL tablet DISSOLVE ONE TABLET UNDER THE TONGUE EVERY 5 MINUTES AS NEEDED FOR CHEST PAIN.  DO NOT EXCEED A TOTAL OF 3 DOSES IN 15 MINUTES 50 tablet 3   NOVOLOG  FLEXPEN 100 UNIT/ML FlexPen Inject 10 units subcutaneously three times daily with meals and adjust as directed - max daily dose is 50 units 15 mL 5   OXYGEN  Inhale 2 L/min into the lungs at bedtime.     pantoprazole  (PROTONIX ) 40 MG tablet Take 1 tablet (40 mg total) by mouth daily. 30 tablet 5   ticagrelor  (BRILINTA ) 90 MG TABS tablet Take 1 tablet (90 mg total) by mouth 2 (two) times daily. 60 tablet 0   torsemide  (DEMADEX ) 20 MG tablet Take 3 tablets (60 mg total) by mouth daily. 30 tablet 0   Vitamin D , Ergocalciferol , (DRISDOL ) 1.25 MG (50000 UNIT) CAPS capsule Take 1 capsule (50,000 Units total) by mouth every 7 (seven) days. 5 capsule 0   No current facility-administered medications for this encounter.   Allergies  Allergen Reactions   Naproxen Hives and Rash   Shellfish Protein-Containing Drug Products Hives, Swelling and Other (See Comments)    Angioedema    Strawberry (Diagnostic) Hives   Celecoxib Other (See Comments)    Stomach pain   Doxepin  Hcl Other (See Comments)    Lethargic, sleeping a lot per pt and family   Glucosamine Hives, Swelling and Other (See Comments)    Angioedema    Iron  Hives and Other (See Comments)    IV iron  transfusion - hives - Feb 2022 hospitalization  Patient said she CAN tolerate if given Benadryl  first   Metoclopramide Other (See Comments)    Facial twitching and stuttering   Metolazone  Other (See Comments)  Acute renal failure   Statins Other (See Comments)    Muscle pain   Social History   Socioeconomic History   Marital status: Married    Spouse name: [name]    Number of children: 2   Years of education: 16   Highest education level: Master's degree (e.g., MA, MS, MEng, MEd, MSW, MBA)  Occupational History   Occupation: on disability/retired early former engineer, site  Tobacco Use   Smoking status: Former    Current packs/day: 0.00    Types: Cigarettes    Start date: 28    Quit date: 1984    Years since quitting: 42.0    Passive exposure: Past   Smokeless tobacco: Never  Vaping Use   Vaping status: Never Used  Substance and Sexual Activity   Alcohol use: Never   Drug use: Never   Sexual activity: Not on file  Other Topics Concern   Not on file  Social History Narrative   Not on file   Social Drivers of Health   Tobacco Use: Medium Risk (07/22/2024)   Patient History    Smoking Tobacco Use: Former    Smokeless Tobacco Use: Never    Passive Exposure: Past  Physicist, Medical Strain: Low Risk (10/24/2023)   Overall Financial Resource Strain (CARDIA)    Difficulty of Paying Living Expenses: Not hard at all  Food Insecurity: No Food Insecurity (07/01/2024)   Epic    Worried About Programme Researcher, Broadcasting/film/video in the Last Year: Never true    Ran Out of Food in the Last Year: Never true  Transportation Needs: No Transportation Needs (07/01/2024)   Epic    Lack of Transportation (Medical): No    Lack of Transportation (Non-Medical): No  Physical Activity: Insufficiently Active (10/24/2023)   Exercise Vital Sign    Days of Exercise per Week: 3 days    Minutes of Exercise per Session: 30 min  Stress: Stress Concern Present (10/24/2023)   [employer] of Occupational Health - Occupational Stress Questionnaire    Feeling of Stress : To some extent  Social Connections: Moderately Integrated (12/14/2023)   Social Connection and Isolation Panel    Frequency of Communication with Friends and Family: More than three times a week    Frequency of Social Gatherings with Friends and Family: Three times a week    Attends Religious Services: More  than 4 times per year    Active Member of Clubs or Organizations: No    Attends Banker Meetings: Never    Marital Status: Married  Catering Manager Violence: Not At Risk (07/01/2024)   Epic    Fear of Current or Ex-Partner: No    Emotionally Abused: No    Physically Abused: No    Sexually Abused: No  Depression (PHQ2-9): Medium Risk (05/13/2024)   Depression (PHQ2-9)    PHQ-2 Score: 9  Alcohol Screen: Low Risk (10/24/2023)   Alcohol Screen    Last Alcohol Screening Score (AUDIT): 0  Housing: Low Risk (07/01/2024)   Epic    Unable to Pay for Housing in the Last Year: No    Number of Times Moved in the Last Year: 0    Homeless in the Last Year: No  Utilities: Not At Risk (07/01/2024)   Epic    Threatened with loss of utilities: No  Health Literacy: Adequate Health Literacy (10/24/2023)   B1300 Health Literacy    Frequency of need for help with medical instructions: Never   Family History  Problem  Relation Age of Onset   Breast cancer Mother 74       contralateral breast ca dx 71   Coronary artery disease Mother    Hypertension Mother    Hyperlipidemia Mother    Diabetes type II Mother    Lung cancer Mother 6   Diabetes Father    Hypertension Father    Mental illness Sister    Bipolar disorder Other    Depression Other    Schizophrenia Other    Cancer Other        MGF's sisters x2; unknown type   BP 104/69   Pulse 66   Ht 5' 4.5 (1.638 m)   Wt 83.6 kg (184 lb 6.4 oz)   SpO2 97%   BMI 31.16 kg/m   Wt Readings from Last 3 Encounters:  07/22/24 83.6 kg (184 lb 6.4 oz)  07/01/24 83.5 kg (184 lb)  05/13/24 89.4 kg (197 lb)   PHYSICAL EXAM: General:   No resp difficulty Neck: no JVD.  Cor: Regular rate & rhythm.  Lungs: clear Abdomen: soft, nontender, nondistended.  Extremities: no  edema. LBKA  Neuro: alert & oriented x3   ASSESSMENT & PLAN:  1. CAD, H/o NSTEMIs - H/o multiple PCI with stents to mid LAD and pRCA - NSTEMI (2/22). Cath  deferred due to AKI. Treated medically.  - NSTEMI (7/22)-->S/p PCI/DES to mid Cx, balloon angioplasty to OM1  - LHC (2/24) for chest pain--> new 90% stenosis pLAD. Successful PCI of pLAD with DES x 1 overlapping with prior stent.  - LHC (5/24) --> DES to LAD, severe 85% ostial prox RCA will need eventual intervention but renal function precludes.  - LHC (8/24): PTCA/DES x 1 proximal LAD - LHC (12/24): with proximal LAD restenosis. S/p successful balloon angioplasty 07/18/23 - No chest pain.  - Continue ASA + Brilinta . - Continue Coreg . - Continue Repatha  + Zetia  (allergy  statins).  - Now off Imdur   2. Chronic Diastolic Heart Failure.  - Echo (3/22): EF 55-60%, Grade II DD, normal RV - RHC (10/22): mild to moderate elevated filling pressures, large v-waves PCWP tracing - TEE (10/22): EF 55-60%, basal inferior akinesis, severe central MR.>>Underwent TEER>>mod residual MR - Echo (11/22): EF 50-55%, severe LV HK, moderate MR - Echo (12/24): EF 55-60%, LV with no RWMA, RV normal, mild MR mG 5 mmHg - Functionally limited by BKA but steadily improving with PT. Able to use prosthetic and has not been short of breath.  - Continue torsemide  60 mg daily + 20 KCL daily. -- No SGLT2i with DM1 and recurrent UTIs - BMET today.  3. CKD IV - Prior h/o CIN following LHC - Previous SCr baseline 1.6-1.8 with multiple AKIs over the last year. New baseline ~ 2.0-2.5 - Followed by Dr. Prescilla. Last SCr 2.2 - AKI post cath (5/24), 60 cc contrast used. SCr 2.7>4.25 - AKI post cath (8/24), 80 cc contrast used. SCr peak at 5.19 - BMET today.  4. MR  - Severe central MR on TEE on 10/22.   - S/p TEER 11/22 - two clips placed, one became entangled and had to be deployed. - Moderate residual MR on echo 11/22 - Echo (5/24): post mitral clip x 2 with XTW and NTW the latter entangled in chords no obvious SLD noted mean gradient 5 mmHg. Mild residual MR appears medial to the two clips  - Echo 12/24: Stable  moderate MR - TEE 6/25: EF 50-55%, severe MR, mitra-clip stable   5. OSA - Continue  CPAP - No change   6. DM I - On insulin  - Recent A1c 9 10/24 - No SGLT2i with DM1 and recurrent UTIs  Follow up 3-4 months with Dr Cherrie.   Greig Mosses, NP  2:41 PM  "

## 2024-07-24 ENCOUNTER — Other Ambulatory Visit: Payer: Self-pay

## 2024-07-24 ENCOUNTER — Other Ambulatory Visit (HOSPITAL_COMMUNITY): Payer: Self-pay

## 2024-07-24 ENCOUNTER — Other Ambulatory Visit (HOSPITAL_COMMUNITY): Payer: Self-pay | Admitting: Internal Medicine

## 2024-07-27 ENCOUNTER — Encounter (HOSPITAL_COMMUNITY): Payer: Self-pay | Admitting: Internal Medicine

## 2024-07-27 MED ORDER — TICAGRELOR 90 MG PO TABS
90.0000 mg | ORAL_TABLET | Freq: Two times a day (BID) | ORAL | 11 refills | Status: DC
  Filled 2024-07-27: qty 60, 30d supply, fill #0

## 2024-07-28 ENCOUNTER — Telehealth (HOSPITAL_COMMUNITY): Payer: Self-pay

## 2024-07-28 ENCOUNTER — Encounter (HOSPITAL_COMMUNITY): Payer: Self-pay | Admitting: Internal Medicine

## 2024-07-28 ENCOUNTER — Other Ambulatory Visit (HOSPITAL_COMMUNITY): Payer: Self-pay

## 2024-07-28 ENCOUNTER — Other Ambulatory Visit: Payer: Self-pay

## 2024-07-28 MED ORDER — TICAGRELOR 90 MG PO TABS
90.0000 mg | ORAL_TABLET | Freq: Two times a day (BID) | ORAL | 11 refills | Status: AC
  Filled 2024-07-28: qty 60, 30d supply, fill #0

## 2024-07-28 MED ORDER — TICAGRELOR 90 MG PO TABS: 90.0000 mg | ORAL_TABLET | Freq: Two times a day (BID) | ORAL | 11 refills | Status: DC

## 2024-07-28 NOTE — Telephone Encounter (Signed)
 Advanced Heart Failure Patient Advocate Encounter  Patient contacted office with concerns regarding Brilinta  cost. Test billing does indicate that there is a deductible for 2026 (pt is aware) and that this medication is listed as Tier 3.   [facility] test billing returns a $324.45 copay for 30 days of Brilinta , and $63.50 copay for 30 days Ticagrelor  (generic).  There are currently no grants that I am aware of to assist with the cost of this medication, and copay savings cards would not apply with this patients insurance coverage at this time.  Informed patient by phone, pt is agreeable to having her local [facility] fill 1 month Ticagrelor  to maintain adherence, and would like to discuss the potential of returning to Plavix  with care team as well.  Rachel DEL, CPhT Rx Patient Advocate Phone: [phone]

## 2024-07-29 NOTE — Telephone Encounter (Signed)
 Addressed with pharmacy team

## 2024-07-30 ENCOUNTER — Other Ambulatory Visit (HOSPITAL_COMMUNITY): Payer: Self-pay

## 2024-08-05 ENCOUNTER — Other Ambulatory Visit: Payer: Self-pay

## 2024-08-07 ENCOUNTER — Encounter: Payer: Self-pay | Admitting: Pharmacist

## 2024-08-07 ENCOUNTER — Other Ambulatory Visit: Payer: Self-pay

## 2024-08-11 ENCOUNTER — Encounter: Payer: Self-pay | Admitting: Family Medicine

## 2024-08-11 ENCOUNTER — Ambulatory Visit: Payer: Self-pay

## 2024-08-11 ENCOUNTER — Ambulatory Visit: Admitting: Family Medicine

## 2024-08-11 ENCOUNTER — Other Ambulatory Visit: Payer: Self-pay

## 2024-08-11 VITALS — BP 110/60 | HR 78 | Temp 97.3°F | Resp 18 | Ht 64.5 in | Wt 191.6 lb

## 2024-08-11 DIAGNOSIS — R300 Dysuria: Secondary | ICD-10-CM | POA: Insufficient documentation

## 2024-08-11 DIAGNOSIS — N3001 Acute cystitis with hematuria: Secondary | ICD-10-CM | POA: Insufficient documentation

## 2024-08-11 LAB — POCT URINALYSIS DIP (CLINITEK)
Bilirubin, UA: NEGATIVE
Glucose, UA: NEGATIVE mg/dL
Ketones, POC UA: NEGATIVE mg/dL
Nitrite, UA: NEGATIVE
POC PROTEIN,UA: 30 — AB
Spec Grav, UA: 1.01
Urobilinogen, UA: 0.2 U/dL
pH, UA: 6

## 2024-08-11 MED ORDER — CEPHALEXIN 250 MG PO CAPS: 250.0000 mg | ORAL_CAPSULE | Freq: Two times a day (BID) | ORAL | 0 refills | Status: AC

## 2024-08-11 NOTE — Progress Notes (Signed)
 "  Acute Office Visit  Subjective:    Patient ID: Dana [name], female    DOB: Dec 01, 1960, 64 y.o.   MRN: [id number]  Chief Complaint  Patient presents with   Dysuria    Discussed the use of AI scribe software for clinical note transcription with the patient, who gave verbal consent to proceed.  History of Present Illness   Dana [name] is a 64 year old female who presents with symptoms of a urinary tract infection.  Urinary tract infection symptoms - Right-sided flank pain for the past 4-5 days - Nocturnal chills requiring use of a heating pad and heated blanket - Cloudy urine with hematuria and odor - No fever - No increased urinary frequency, urgency, or suprapubic pain - Increasing water intake by an extra bottle daily  Urinary tract infection treatment history - Previous urinary tract infection in May 13, 2024, treated with keflex  without adverse side effects - Prescribed Bactrim  and Macrobid  for urinary tract infections in the past but does not remember   Medication changes and allergies - Recent switch to generic form of Brilinta ; only recent medication change - Allergy  to doxepin  - Adverse reactions to certain muscle relaxers and nausea medications, including muscle jerks and stuttering      Past Medical History:  Diagnosis Date   Acquired absence of left great toe    Acquired absence of other left toe(s)    ACS (acute coronary syndrome) (HCC)    Acute bronchitis due to COVID-19 virus 03/03/2023   Acute on chronic combined systolic and diastolic CHF (congestive heart failure) (HCC) 09/16/2023   Acute respiratory failure with hypoxia (HCC) 07/20/2023   Anemia    Anoxic brain injury (HCC)    Anxiety    CAD (coronary artery disease)    DES mLAD 04/07/15; DES dRCA & DES pPDA 08/30/16; DES mCX & PTCA OM1 09/29/20   Charcot's joint of foot in type 2 diabetes mellitus (HCC) 12/15/2023   Chronic diastolic (congestive) heart failure (HCC)    Chronic pain     Chronic systolic (congestive) heart failure (HCC)    CKD (chronic kidney disease)    Contrast dye induced nephropathy, possible 09/03/2020   COPD (chronic obstructive pulmonary disease) (HCC)    COVID-19 virus infection 03/03/2023   Demand ischemia (HCC)    Diabetes (HCC)type 1    Diastolic CHF (HCC)    Dyspnea    Encounter for screening mammogram for malignant neoplasm of breast 09/16/2023   Family history of breast cancer 10/23/2021   Gastro-esophageal reflux disease without esophagitis    Hyperlipidemia    Hypertension    Hypertensive heart disease with heart failure (HCC)    Hypoxemia    Idiopathic gout, multiple sites    Leukocytosis 03/29/2022   Localized edema    Low back pain    Mitral regurgitation    Mitral regurgitation    Mixed hyperlipidemia    Mixed incontinence    Morbid (severe) obesity due to excess calories (HCC)    Neuromuscular disorder (HCC)    Neuropathy    Non-ST elevation (NSTEMI) myocardial infarction (HCC)    08/29/20, 09/29/20   OSA (obstructive sleep apnea) 09/03/2020   Other specified hypothyroidism    PAF (paroxysmal atrial fibrillation) (HCC)    s/p pulmonary vein isolation by cryoablation 10/04/15 Dr. Meldon in [facility]   PEA (Pulseless electrical activity) [facility])    ~ 01/13/21 at [facility] during admission DKA, volume overload, possible CAP, hypoxia s/p ACLS with ROSC ~  10-15   Pneumonia    Primary insomnia    Rheumatoid arthritis (HCC)    Stroke (HCC)    Thyroid  disease    Type 1 diabetes mellitus (HCC)    URI with cough and congestion 08/22/2023   Vitamin D  deficiency     Past Surgical History:  Procedure Laterality Date   ABDOMINAL HYSTERECTOMY     Partial- Still has both ovaries   AMPUTATION Left 12/18/2023   Procedure: AMPUTATION BELOW KNEE;  Surgeon: Harden Jerona GAILS, MD;  Location: [facility] OR;  Service: Orthopedics;  Laterality: Left;   ANKLE FUSION Left 11/13/2023   Procedure: HINDFOOT FUSION FIXATION OF ANKLE;  Surgeon: Kendal Franky SQUIBB, MD;  Location: [facility] OR;  Service: Orthopedics;  Laterality: Left;   CARDIOVASCULAR STRESS TEST  08/13/2014   Nuclear; Normal   CARPAL TUNNEL RELEASE     CATARACT EXTRACTION, BILATERAL     CESAREAN SECTION     CHOLECYSTECTOMY     CORONARY ANGIOPLASTY WITH STENT PLACEMENT     3 blockages/ 1 stent   CORONARY BALLOON ANGIOPLASTY N/A 07/18/2023   Procedure: CORONARY BALLOON ANGIOPLASTY;  Surgeon: Verlin Lonni BIRCH, MD;  Location: [facility] INVASIVE CV LAB;  Service: Cardiovascular;  Laterality: N/A;   CORONARY PRESSURE/FFR STUDY N/A 09/07/2022   Procedure: INTRAVASCULAR PRESSURE WIRE/FFR STUDY;  Surgeon: Jordan, Peter M, MD;  Location: [facility] INVASIVE CV LAB;  Service: Cardiovascular;  Laterality: N/A;   CORONARY STENT INTERVENTION N/A 09/29/2020   Procedure: CORONARY STENT INTERVENTION;  Surgeon: Wonda Sharper, MD;  Location: [facility] INVASIVE CV LAB;  Service: Cardiovascular;  Laterality: N/A;  CFX   CORONARY STENT INTERVENTION N/A 09/07/2022   Procedure: CORONARY STENT INTERVENTION;  Surgeon: Jordan, Peter M, MD;  Location: [facility] INVASIVE CV LAB;  Service: Cardiovascular;  Laterality: N/A;   CORONARY STENT INTERVENTION N/A 02/13/2023   Procedure: CORONARY STENT INTERVENTION;  Surgeon: Verlin Lonni BIRCH, MD;  Location: [facility] INVASIVE CV LAB;  Service: Cardiovascular;  Laterality: N/A;  LAD   CORONARY ULTRASOUND/IVUS N/A 02/13/2023   Procedure: Coronary Ultrasound/IVUS;  Surgeon: Verlin Lonni BIRCH, MD;  Location: [facility] INVASIVE CV LAB;  Service: Cardiovascular;  Laterality: N/A;   CORONARY/GRAFT ACUTE MI REVASCULARIZATION N/A 12/08/2022   Procedure: Coronary/Graft Acute MI Revascularization;  Surgeon: Anner Alm ORN, MD;  Location: [facility] INVASIVE CV LAB;  Service: Cardiovascular;  Laterality: N/A;   EYE SURGERY     LEFT HEART CATH AND CORONARY ANGIOGRAPHY N/A 09/29/2020   Procedure: LEFT HEART CATH AND CORONARY ANGIOGRAPHY;  Surgeon: Wonda Sharper, MD;  Location: [facility] INVASIVE CV LAB;  Service:  Cardiovascular;  Laterality: N/A;   LEFT HEART CATH AND CORONARY ANGIOGRAPHY N/A 09/07/2022   Procedure: LEFT HEART CATH AND CORONARY ANGIOGRAPHY;  Surgeon: Jordan, Peter M, MD;  Location: [facility] INVASIVE CV LAB;  Service: Cardiovascular;  Laterality: N/A;   LEFT HEART CATH AND CORONARY ANGIOGRAPHY N/A 12/08/2022   Procedure: LEFT HEART CATH AND CORONARY ANGIOGRAPHY;  Surgeon: Anner Alm ORN, MD;  Location: [facility] INVASIVE CV LAB;  Service: Cardiovascular;  Laterality: N/A;   LEFT HEART CATH AND CORONARY ANGIOGRAPHY N/A 02/13/2023   Procedure: LEFT HEART CATH AND CORONARY ANGIOGRAPHY;  Surgeon: Verlin Lonni BIRCH, MD;  Location: [facility] INVASIVE CV LAB;  Service: Cardiovascular;  Laterality: N/A;   LEFT HEART CATH AND CORONARY ANGIOGRAPHY N/A 07/16/2023   Procedure: LEFT HEART CATH AND CORONARY ANGIOGRAPHY;  Surgeon: Verlin Lonni BIRCH, MD;  Location: [facility] INVASIVE CV LAB;  Service: Cardiovascular;  Laterality: N/A;   RIGHT HEART CATH N/A 04/27/2021  Procedure: RIGHT HEART CATH;  Surgeon: Cherrie Toribio SAUNDERS, MD;  Location: [facility] INVASIVE CV LAB;  Service: Cardiovascular;  Laterality: N/A;   RIGHT/LEFT HEART CATH AND CORONARY ANGIOGRAPHY N/A 01/07/2017   Procedure: Right/Left Heart Cath and Coronary Angiography;  Surgeon: Rolan Ezra RAMAN, MD;  Location: [facility] INVASIVE CV LAB;  Service: Cardiovascular;  Laterality: N/A;   ROTATOR CUFF REPAIR     TEE WITHOUT CARDIOVERSION N/A 04/28/2021   Procedure: TRANSESOPHAGEAL ECHOCARDIOGRAM (TEE);  Surgeon: Cherrie Toribio SAUNDERS, MD;  Location: [facility] ENDOSCOPY;  Service: Cardiovascular;  Laterality: N/A;   TEE WITHOUT CARDIOVERSION N/A 05/18/2021   Procedure: TRANSESOPHAGEAL ECHOCARDIOGRAM (TEE);  Surgeon: Thukkani, Arun K, MD;  Location: [facility] INVASIVE CV LAB;  Service: Cardiovascular;  Laterality: N/A;   TOE AMPUTATION Left    TRANSCATHETER MITRAL EDGE TO EDGE REPAIR N/A 05/18/2021   Procedure: MITRAL VALVE REPAIR;  Surgeon: Wendel Lurena POUR, MD;  Location: [facility] INVASIVE CV  LAB;  Service: Cardiovascular;  Laterality: N/A;   TRANSESOPHAGEAL ECHOCARDIOGRAM (CATH LAB) N/A 12/23/2023   Procedure: TRANSESOPHAGEAL ECHOCARDIOGRAM;  Surgeon: Kate Lonni CROME, MD;  Location: [facility] INVASIVE CV LAB;  Service: Cardiovascular;  Laterality: N/A;   US  ECHOCARDIOGRAPHY  05/2017   Normal    Family History  Problem Relation Age of Onset   Breast cancer Mother 15       contralateral breast ca dx 78   Coronary artery disease Mother    Hypertension Mother    Hyperlipidemia Mother    Diabetes type II Mother    Lung cancer Mother 82   Diabetes Father    Hypertension Father    Mental illness Sister    Bipolar disorder Other    Depression Other    Schizophrenia Other    Cancer Other        MGF's sisters x2; unknown type    Social History   Socioeconomic History   Marital status: Married    Spouse name: [name]   Number of children: 2   Years of education: 16   Highest education level: Master's degree (e.g., MA, MS, MEng, MEd, MSW, MBA)  Occupational History   Occupation: on disability/retired early former engineer, site  Tobacco Use   Smoking status: Former    Current packs/day: 0.00    Types: Cigarettes    Start date: 35    Quit date: 1984    Years since quitting: 42.1    Passive exposure: Past   Smokeless tobacco: Never  Vaping Use   Vaping status: Never Used  Substance and Sexual Activity   Alcohol use: Never   Drug use: Never   Sexual activity: Not on file  Other Topics Concern   Not on file  Social History Narrative   Not on file   Social Drivers of Health   Tobacco Use: Medium Risk (08/11/2024)   Patient History    Smoking Tobacco Use: Former    Smokeless Tobacco Use: Never    Passive Exposure: Past  Physicist, Medical Strain: Low Risk (10/24/2023)   Overall Financial Resource Strain (CARDIA)    Difficulty of Paying Living Expenses: Not hard at all  Food Insecurity: No Food Insecurity (07/01/2024)   Epic    Worried About Radiation Protection Practitioner of  Food in the Last Year: Never true    Ran Out of Food in the Last Year: Never true  Transportation Needs: No Transportation Needs (07/01/2024)   Epic    Lack of Transportation (Medical): No    Lack of Transportation (Non-Medical): No  Physical Activity:  Insufficiently Active (10/24/2023)   Exercise Vital Sign    Days of Exercise per Week: 3 days    Minutes of Exercise per Session: 30 min  Stress: Stress Concern Present (10/24/2023)   [employer] of Occupational Health - Occupational Stress Questionnaire    Feeling of Stress : To some extent  Social Connections: Moderately Integrated (12/14/2023)   Social Connection and Isolation Panel    Frequency of Communication with Friends and Family: More than three times a week    Frequency of Social Gatherings with Friends and Family: Three times a week    Attends Religious Services: More than 4 times per year    Active Member of Clubs or Organizations: No    Attends Banker Meetings: Never    Marital Status: Married  Catering Manager Violence: Not At Risk (07/01/2024)   Epic    Fear of Current or Ex-Partner: No    Emotionally Abused: No    Physically Abused: No    Sexually Abused: No  Depression (PHQ2-9): Medium Risk (05/13/2024)   Depression (PHQ2-9)    PHQ-2 Score: 9  Alcohol Screen: Low Risk (10/24/2023)   Alcohol Screen    Last Alcohol Screening Score (AUDIT): 0  Housing: Low Risk (07/01/2024)   Epic    Unable to Pay for Housing in the Last Year: No    Number of Times Moved in the Last Year: 0    Homeless in the Last Year: No  Utilities: Not At Risk (07/01/2024)   Epic    Threatened with loss of utilities: No  Health Literacy: Adequate Health Literacy (10/24/2023)   B1300 Health Literacy    Frequency of need for help with medical instructions: Never    Outpatient Medications Prior to Visit  Medication Sig Dispense Refill   acetaminophen  (TYLENOL ) 325 MG tablet Take 2 tablets (650 mg total) by mouth every 6  (six) hours as needed for mild pain (pain score 1-3) or fever (or Fever >/= 101).     allopurinol  (ZYLOPRIM ) 100 MG tablet Take 0.5 tablets (50 mg total) by mouth daily. 30 tablet 5   Aspirin  81 MG CAPS as directed Orally     aspirin  EC 81 MG tablet Take 1 tablet (81 mg total) by mouth daily. Swallow whole. 30 tablet 12   busPIRone  (BUSPAR ) 15 MG tablet Take 1 tablet (15 mg total) by mouth 3 (three) times daily. 90 tablet 5   carvedilol  (COREG ) 6.25 MG tablet Take 6.25 mg by mouth 2 (two) times daily with a meal.     Continuous Glucose Receiver (FREESTYLE LIBRE 2 READER) DEVI Use to monitor blood sugars continuously daily E10.65 1 each 0   Continuous Glucose Receiver (FREESTYLE LIBRE 3 READER) DEVI Use as directed with compatible sensors to monitor blood sugar levels. 1 each 0   Continuous Glucose Sensor (FREESTYLE LIBRE 3 PLUS SENSOR) MISC Change sensor every 15 days. 6 each 0   diclofenac  Sodium (VOLTAREN ) 1 % GEL Apply 2 g topically 4 (four) times daily. 100 g 0   EPINEPHrine  0.3 mg/0.3 mL IJ SOAJ injection Inject 0.3 mg into the muscle as needed.     Evolocumab  (REPATHA  SURECLICK) 140 MG/ML SOAJ Inject 140 mg into the skin every 14 (fourteen) days. 6 mL 1   ezetimibe  (ZETIA ) 10 MG tablet Take 1 tablet (10 mg total) by mouth daily. 30 tablet 5   Ferrous Sulfate  (IRON ) 325 (65 Fe) MG TABS 1 tablet Orally Once a day; Duration: 30 day(s)  Furosemide  (FUROSCIX ) 80 MG/10ML CTKT Inject 80 mg into the skin as directed. 4 each 3   gabapentin  (NEURONTIN ) 300 MG capsule Take 1 capsule (300 mg total) by mouth 2 (two) times daily. 180 capsule 3   Glucagon  (GVOKE HYPOPEN  2-PACK) 0.5 MG/0.1ML SOAJ Inject 0.5 mg into the skin daily as needed. 0.2 mL 5   HYDROcodone -acetaminophen  (NORCO) 7.5-325 MG tablet Take 1 tablet by mouth 3 (three) times daily as needed. 90 tablet 0   insulin  aspart (NOVOLOG  FLEXPEN) 100 UNIT/ML FlexPen Inject 2 - 10 Units before meals based on SSI. 15 mL 3   insulin  glargine (LANTUS   SOLOSTAR) 100 UNIT/ML Solostar Pen Inject 30 units subcutaneously daily and increase as directed - max daily dose is 50 units 15 mL 5   Insulin  Pen Needle (BD PEN NEEDLE NANO 2ND GEN) 32G X 4 MM MISC Inject 1 each into the skin in the morning, at noon, in the evening, and at bedtime. 600 each 3   Insulin  Pen Needle 32G X 4 MM MISC Use with Lantus  pen 100 each 0   isosorbide  mononitrate (IMDUR ) 30 MG 24 hr tablet Take 30 mg by mouth daily.     latanoprost  (XALATAN ) 0.005 % ophthalmic solution Place 1 drop into both eyes at bedtime. 2.5 mL 0   lidocaine  4 % Place 1 patch onto the skin daily. Remove & Discard patch within 12 hours or as directed by MD 12 patch 0   linaclotide  (LINZESS ) 145 MCG CAPS capsule Take 1 capsule (145 mcg total) by mouth daily before breakfast. 30 capsule 0   Menthol -Methyl Salicylate  (ICY HOT EXTRA STRENGTH) 10-30 % CREA Apply 1 Application topically as needed (bilateral hips and knees). 85 g 1   nitroGLYCERIN  (NITROSTAT ) 0.4 MG SL tablet DISSOLVE ONE TABLET UNDER THE TONGUE EVERY 5 MINUTES AS NEEDED FOR CHEST PAIN.  DO NOT EXCEED A TOTAL OF 3 DOSES IN 15 MINUTES 50 tablet 3   NOVOLOG  FLEXPEN 100 UNIT/ML FlexPen Inject 10 units subcutaneously three times daily with meals and adjust as directed - max daily dose is 50 units 15 mL 5   OXYGEN  Inhale 2 L/min into the lungs at bedtime.     pantoprazole  (PROTONIX ) 40 MG tablet Take 1 tablet (40 mg total) by mouth daily. 30 tablet 5   ticagrelor  (BRILINTA ) 90 MG TABS tablet Take 1 tablet (90 mg total) by mouth 2 (two) times daily. 60 tablet 11   torsemide  (DEMADEX ) 20 MG tablet Take 3 tablets (60 mg total) by mouth daily. 90 tablet 5   Vitamin D , Ergocalciferol , (DRISDOL ) 1.25 MG (50000 UNIT) CAPS capsule Take 1 capsule (50,000 Units total) by mouth every 7 (seven) days. 5 capsule 0   No facility-administered medications prior to visit.    Allergies[1]  Review of Systems  Constitutional:  Positive for chills. Negative for fatigue  and fever.  HENT:  Negative for congestion and ear pain.   Respiratory:  Negative for cough and shortness of breath.   Cardiovascular:  Negative for chest pain.  Gastrointestinal:  Negative for abdominal pain, constipation, nausea and vomiting.  Genitourinary:  Positive for dysuria, flank pain (right), frequency and hematuria.  Musculoskeletal:  Negative for arthralgias and myalgias.  Neurological:  Negative for headaches.  Psychiatric/Behavioral:  Negative for dysphoric mood and sleep disturbance. The patient is not nervous/anxious.    Lab Results  Component Value Date   COLORU yellow 08/11/2024   CLARITYU cloudy (A) 08/11/2024   GLUCOSEUR negative 08/11/2024   BILIRUBINUR negative  08/11/2024   KETONESU negative 07/11/2021   SPECGRAV 1.010 08/11/2024   RBCUR small (A) 08/11/2024   PHUR 6.0 08/11/2024   PROTEINUR 100 (A) 01/20/2024   UROBILINOGEN 0.2 08/11/2024   LEUKOCYTESUR Large (3+) (A) 08/11/2024       Objective:        08/11/2024    2:55 PM 07/22/2024    2:12 PM 07/01/2024   10:15 AM  Vitals with BMI  Height 5' 4.5 5' 4.5   Weight 191 lbs 10 oz 184 lbs 6 oz 184 lbs  BMI 32.39 31.17   Systolic 110 104 870  Diastolic 60 69 66  Pulse 78 66 60    No data found.   Physical Exam Vitals reviewed.  Constitutional:      General: She is not in acute distress.    Appearance: Normal appearance.  Eyes:     Conjunctiva/sclera: Conjunctivae normal.  Cardiovascular:     Rate and Rhythm: Normal rate and regular rhythm.     Heart sounds: Normal heart sounds. No murmur heard. Pulmonary:     Effort: Pulmonary effort is normal. No respiratory distress.     Breath sounds: Normal breath sounds.  Abdominal:     Palpations: Abdomen is soft.  Musculoskeletal:     Comments: Wheelchair   Skin:    General: Skin is warm.  Neurological:     Mental Status: She is alert and oriented to person, place, and time.  Psychiatric:        Mood and Affect: Mood normal.        Behavior:  Behavior normal.     Health Maintenance Due  Topic Date Due   DTaP/Tdap/Td (1 - Tdap) Never done   Cervical Cancer Screening (HPV/Pap Cotest)  Never done   Zoster Vaccines- Shingrix (1 of 2) Never done   Mammogram  03/15/2023   OPHTHALMOLOGY EXAM  11/19/2023   COVID-19 Vaccine (6 - 2025-26 season) 03/16/2024   FOOT EXAM  04/03/2024    There are no preventive care reminders to display for this patient.   Lab Results  Component Value Date   TSH 1.950 05/28/2024   Lab Results  Component Value Date   WBC 11.4 (H) 05/28/2024   HGB 10.3 (L) 05/28/2024   HCT 33.7 (L) 05/28/2024   MCV 92 05/28/2024   PLT 251 05/28/2024   Lab Results  Component Value Date   NA 131 (L) 07/22/2024   K 4.1 07/22/2024   CO2 25 07/22/2024   GLUCOSE 267 (H) 07/22/2024   BUN 81 (H) 07/22/2024   CREATININE 2.85 (H) 07/22/2024   BILITOT 0.4 05/28/2024   ALKPHOS 244 (H) 05/28/2024   AST 17 05/28/2024   ALT 16 05/28/2024   PROT 7.5 05/28/2024   ALBUMIN  4.0 05/28/2024   CALCIUM  9.1 07/22/2024   ANIONGAP 12 07/22/2024   EGFR 18 (L) 05/28/2024   Lab Results  Component Value Date   CHOL 118 05/28/2024   Lab Results  Component Value Date   HDL 46 05/28/2024   Lab Results  Component Value Date   LDLCALC 52 05/28/2024   Lab Results  Component Value Date   TRIG 108 05/28/2024   Lab Results  Component Value Date   CHOLHDL 2.6 05/28/2024   Lab Results  Component Value Date   HGBA1C 12.6 (H) 05/28/2024        Results for orders placed or performed in visit on 08/11/24  POCT URINALYSIS DIP (CLINITEK)   Collection Time: 08/11/24  3:20 PM  Result Value Ref Range   Color, UA yellow yellow   Clarity, UA cloudy (A) clear   Glucose, UA negative negative mg/dL   Bilirubin, UA negative negative   Ketones, POC UA negative negative mg/dL   Spec Grav, UA 8.989 8.989 - 1.025   Blood, UA small (A) negative   pH, UA 6.0 5.0 - 8.0   POC PROTEIN,UA =30 (A) negative, trace   Urobilinogen, UA  0.2 0.2 or 1.0 E.U./dL   Nitrite, UA Negative Negative   Leukocytes, UA Large (3+) (A) Negative   *Note: Due to a large number of results and/or encounters for the requested time period, some results have not been displayed. A complete set of results can be found in Results Review.     Assessment & Plan:   Assessment & Plan Acute cystitis with hematuria Acute urinary tract infection Symptoms and history suggest UTI. No antibiotic allergies. - Sent urine for culture to identify organism and sensitivity. - Prescribed Keflex  250 mg twice daily for 7 days. - Advised to monitor symptoms and report fever or worsening. - Will follow up with culture results to adjust therapy if needed.  Orders:   Urine Culture   cephALEXin  (KEFLEX ) 250 MG capsule; Take 1 capsule (250 mg total) by mouth 2 (two) times daily for 7 days.  Dysuria UTI Patient here with symptoms of dysuria started yesterday. UA in the office showed Lab Results  Component Value Date   COLORU yellow 08/11/2024   CLARITYU cloudy (A) 08/11/2024   GLUCOSEUR negative 08/11/2024   BILIRUBINUR negative 08/11/2024   KETONESU negative 07/11/2021   SPECGRAV 1.010 08/11/2024   RBCUR small (A) 08/11/2024   PHUR 6.0 08/11/2024   PROTEINUR 100 (A) 01/20/2024   UROBILINOGEN 0.2 08/11/2024   LEUKOCYTESUR Large (3+) (A) 08/11/2024    No suprapubic tenderness noted on exam.  She does not appear septic, has right sided flank tenderness.  Vitals reassuring.   Plan: Symptoms suggestive of urinary tract infection. I have prescribed Keflex  250 mg by mouth TWICE A DAY for 7 days. Your symptoms should gradually improve. Call if the burning worsens, you develop a fever, back pain or pelvic pain or if your symptom do not resolve after completing the antibiotic. Drink plenty of fluids Complete the full course of antibiotics even if the symptoms resolve Remember to wipe from front to back and don't hold it in! If possible, empty your bladder every  4 hours. Advised to watch out for any fever/chills/back pain and to go to the emergency room if she does for evaluation for pyelonephritis and IV fluids and IV antibiotics.  Orders:   POCT URINALYSIS DIP (CLINITEK)      Meds ordered this encounter  Medications   cephALEXin  (KEFLEX ) 250 MG capsule    Sig: Take 1 capsule (250 mg total) by mouth 2 (two) times daily for 7 days.    Dispense:  14 capsule    Refill:  0    Orders Placed This Encounter  Procedures   Urine Culture   POCT URINALYSIS DIP (CLINITEK)     Follow-up: Return if symptoms worsen or fail to improve.  An After Visit Summary was printed and given to the patient.  Harrie Cedar, FNP [facility] [phone]     [1]  Allergies Allergen Reactions   Naproxen Hives and Rash   Shellfish Protein-Containing Drug Products Hives, Swelling and Other (See Comments)    Angioedema    Strawberry (Diagnostic) Hives   Celecoxib Other (See  Comments)    Stomach pain   Doxepin  Hcl Other (See Comments)    Lethargic, sleeping a lot per pt and family   Glucosamine Hives, Swelling and Other (See Comments)    Angioedema    Iron  Hives and Other (See Comments)    IV iron  transfusion - hives - Feb 2022 hospitalization  Patient said she CAN tolerate if given Benadryl  first   Metoclopramide Other (See Comments)    Facial twitching and stuttering   Metolazone  Other (See Comments)    Acute renal failure   Shellfish Allergy  Other (See Comments)   Statins Other (See Comments)    Muscle pain   "

## 2024-08-11 NOTE — Assessment & Plan Note (Signed)
 UTI Patient here with symptoms of dysuria started yesterday. UA in the office showed Lab Results  Component Value Date   COLORU yellow 08/11/2024   CLARITYU cloudy (A) 08/11/2024   GLUCOSEUR negative 08/11/2024   BILIRUBINUR negative 08/11/2024   KETONESU negative 07/11/2021   SPECGRAV 1.010 08/11/2024   RBCUR small (A) 08/11/2024   PHUR 6.0 08/11/2024   PROTEINUR 100 (A) 01/20/2024   UROBILINOGEN 0.2 08/11/2024   LEUKOCYTESUR Large (3+) (A) 08/11/2024    No suprapubic tenderness noted on exam.  She does not appear septic, has right sided flank tenderness.  Vitals reassuring.   Plan: Symptoms suggestive of urinary tract infection. I have prescribed Keflex  250 mg by mouth TWICE A DAY for 7 days. Your symptoms should gradually improve. Call if the burning worsens, you develop a fever, back pain or pelvic pain or if your symptom do not resolve after completing the antibiotic. Drink plenty of fluids Complete the full course of antibiotics even if the symptoms resolve Remember to wipe from front to back and don't hold it in! If possible, empty your bladder every 4 hours. Advised to watch out for any fever/chills/back pain and to go to the emergency room if she does for evaluation for pyelonephritis and IV fluids and IV antibiotics.  Orders:   POCT URINALYSIS DIP (CLINITEK)

## 2024-08-11 NOTE — Assessment & Plan Note (Signed)
 Acute urinary tract infection Symptoms and history suggest UTI. No antibiotic allergies. - Sent urine for culture to identify organism and sensitivity. - Prescribed Keflex  250 mg twice daily for 7 days. - Advised to monitor symptoms and report fever or worsening. - Will follow up with culture results to adjust therapy if needed.  Orders:   Urine Culture   cephALEXin  (KEFLEX ) 250 MG capsule; Take 1 capsule (250 mg total) by mouth 2 (two) times daily for 7 days.

## 2024-08-11 NOTE — Telephone Encounter (Signed)
 FYI Only or Action Required?: FYI only for provider: appointment scheduled on rescheduled to today 1/27.  Patient was last seen in primary care on 05/13/2024 by Sherre Clapper, MD.  Called Nurse Triage reporting Back Pain and Dysuria.  Symptoms began a week ago.  Interventions attempted: Rest, hydration, or home remedies.  Symptoms are: gradually worsening.  Triage Disposition: See HCP Within 4 Hours (Or PCP Triage)  Patient/caregiver understands and will follow disposition?: Yes   Message from Zy'onna H sent at 08/11/2024  1:14 PM EST  Reason for Triage: Patient is having symptoms of chronic UTI and back spasms. Patient has an appt tomorrow with PCP Acute Visit to address issues.  Husband called in and is worried about her doing the visit alone due to the inclement weather.  Want to know if we can squeeze her in today or get her an antibiotic.   Reason for Disposition  Side (flank) or lower back pain present  Answer Assessment - Initial Assessment Questions Both husband and patient present on phone. Patient reports history of long standing UTIs and feels like having a UTI, reports cloudy and foul smelling urine, pain and burning with urination, able to void but having difficulty emptying bladder, urine trickles, and right back side is throbbing or having muscle spasms.  Having some nausea with no vomiting. Does not feel dizzy/ light-headed or like passing out.   Has an appointment tomorrow but husband will be working and unable to assist patient to appointment.  Concerned for weather and patient's need for assistance (has prosthetic leg).  Based on triage disposition and request for appointment today, triage RN is able to reschedule appt to today.  Answer Assessment - Initial Assessment Questions See alternative assessment.  Protocols used: Urinary Symptoms-A-AH, Urination Pain - Female-A-AH

## 2024-08-12 ENCOUNTER — Ambulatory Visit: Admitting: Family Medicine

## 2024-08-13 ENCOUNTER — Encounter: Payer: Self-pay | Admitting: Family Medicine

## 2024-08-13 ENCOUNTER — Other Ambulatory Visit: Payer: Self-pay

## 2024-08-13 MED ORDER — HYDROCODONE-ACETAMINOPHEN 7.5-325 MG PO TABS
1.0000 | ORAL_TABLET | Freq: Three times a day (TID) | ORAL | 0 refills | Status: AC | PRN
  Filled 2024-08-13 – 2024-08-14 (×2): qty 90, 30d supply, fill #0

## 2024-08-14 ENCOUNTER — Other Ambulatory Visit: Payer: Self-pay

## 2024-08-14 ENCOUNTER — Other Ambulatory Visit (HOSPITAL_COMMUNITY): Payer: Self-pay

## 2024-08-14 ENCOUNTER — Other Ambulatory Visit (HOSPITAL_BASED_OUTPATIENT_CLINIC_OR_DEPARTMENT_OTHER): Payer: Self-pay

## 2024-08-14 LAB — URINE CULTURE

## 2024-08-14 NOTE — Patient Instructions (Signed)
 Visit Information  Thank you for taking time to visit with me today. Please don't hesitate to contact me if I can be of assistance to you before our next scheduled appointment.  Your next care management appointment is by telephone on 09/07/2024 at 1:00 pm   Please call the care guide team at [phone] if you need to cancel, schedule, or reschedule an appointment.   Please call the [place]  National Suicide Prevention Lifeline: (318)432-0348 or TTY: 561-297-3624 TTY (220) 071-7864) to talk to a trained counselor if you are experiencing a Mental Health or Behavioral Health Crisis or need someone to talk to.  Elida Pulse, RNCM Case Manager [facility], Population Health Direct Dial: [phone]

## 2024-08-14 NOTE — Patient Outreach (Signed)
 Complex Care Management   Visit Note  08/14/2024  Name:  Dana [name] MRN: [id number] DOB: Oct 30, 1960  Situation: Referral received for Complex Care Management related to Diabetes with Complications I obtained verbal consent from Patient.  Visit completed with Mrs. Mccamish  on the phone  Background:   Past Medical History:  Diagnosis Date   Acquired absence of left great toe    Acquired absence of other left toe(s)    ACS (acute coronary syndrome) (HCC)    Acute bronchitis due to COVID-19 virus 03/03/2023   Acute on chronic combined systolic and diastolic CHF (congestive heart failure) (HCC) 09/16/2023   Acute respiratory failure with hypoxia (HCC) 07/20/2023   Anemia    Anoxic brain injury (HCC)    Anxiety    CAD (coronary artery disease)    DES mLAD 04/07/15; DES dRCA & DES pPDA 08/30/16; DES mCX & PTCA OM1 09/29/20   Charcot's joint of foot in type 2 diabetes mellitus (HCC) 12/15/2023   Chronic diastolic (congestive) heart failure (HCC)    Chronic pain    Chronic systolic (congestive) heart failure (HCC)    CKD (chronic kidney disease)    Contrast dye induced nephropathy, possible 09/03/2020   COPD (chronic obstructive pulmonary disease) (HCC)    COVID-19 virus infection 03/03/2023   Demand ischemia (HCC)    Diabetes (HCC)type 1    Diastolic CHF (HCC)    Dyspnea    Encounter for screening mammogram for malignant neoplasm of breast 09/16/2023   Family history of breast cancer 10/23/2021   Gastro-esophageal reflux disease without esophagitis    Hyperlipidemia    Hypertension    Hypertensive heart disease with heart failure (HCC)    Hypoxemia    Idiopathic gout, multiple sites    Leukocytosis 03/29/2022   Localized edema    Low back pain    Mitral regurgitation    Mitral regurgitation    Mixed hyperlipidemia    Mixed incontinence    Morbid (severe) obesity due to excess calories (HCC)    Neuromuscular disorder (HCC)    Neuropathy    Non-ST elevation (NSTEMI)  myocardial infarction (HCC)    08/29/20, 09/29/20   OSA (obstructive sleep apnea) 09/03/2020   Other specified hypothyroidism    PAF (paroxysmal atrial fibrillation) (HCC)    s/p pulmonary vein isolation by cryoablation 10/04/15 Dr. Meldon in [facility]   PEA (Pulseless electrical activity) [facility])    ~ 01/13/21 at [facility] during admission DKA, volume overload, possible CAP, hypoxia s/p ACLS with ROSC ~ 10-15   Pneumonia    Primary insomnia    Rheumatoid arthritis (HCC)    Stroke (HCC)    Thyroid  disease    Type 1 diabetes mellitus (HCC)    URI with cough and congestion 08/22/2023   Vitamin D  deficiency     Assessment: Patient was on her way to [facility] with husband driving for supplies due to upcoming snow event. She is checking her blood sugars daily with her FBS this am =93. He denies any readings out of parameters. She is currently on Keflex  for a UTI. Admits mild nausea with med but able to tolerate. Encouraged her to take with food and to  make sure she completes all doses even if feeling better and she agreed. She was able to work with pharmacist to her generic Brilinta  and a reasonable cost. Denies any other needs or concerns today. She declined any assistance with Diabetic eating and reports her weight is down to 184. She  is determined to keep trying to reach a healthy weight. Encouragement provided.   Patient Reported Symptoms:  Cognitive Cognitive Status: No symptoms reported, Alert and oriented to person, place, and time, Normal speech and language skills, Insightful and able to interpret abstract concepts Cognitive/Intellectual Conditions Management [RPT]: None reported or documented in medical history or problem list      Neurological Neurological Review of Symptoms: Not assessed    HEENT HEENT Symptoms Reported: Not assessed      Cardiovascular Cardiovascular Symptoms Reported: No symptoms reported Cardiovascular Management Strategies: Exercise, Weight management,  Adequate rest, Fluid modification, Medical device, Medication therapy, Routine screening Cardiovascular Self-Management Outcome: 4 (good)  Respiratory Respiratory Symptoms Reported: No symptoms reported Other Respiratory Symptoms: continues to wear O2 at night Respiratory Management Strategies: Oxygen  therapy, Activity, Diet modification, Weight management, Routine screening  Endocrine Endocrine Symptoms Reported: No symptoms reported Is patient diabetic?: Yes Is patient checking blood sugars at home?: Yes List most recent blood sugar readings, include date and time of day: Fasting BS this am 93. Denes any recent reading out of parameters Endocrine Self-Management Outcome: 4 (good)  Gastrointestinal Gastrointestinal Symptoms Reported: Nausea Additional Gastrointestinal Details: Mild nausea on Keflex  but tolerable at this time. Encouraged her to reach out to PCP if that worsens. Eat something before taking ABT. Gastrointestinal Management Strategies: Medication therapy, Adequate rest Gastrointestinal Self-Management Outcome: 4 (good)    Genitourinary Genitourinary Symptoms Reported: Pain/burning with urination Additional Genitourinary Details: Currently on Keflex  for UTI. Instructed to complete all of ABT even if feeling better. Genitourinary Management Strategies: Medication therapy, Adequate rest Genitourinary Self-Management Outcome: 4 (good)  Integumentary Integumentary Symptoms Reported: Not assessed    Musculoskeletal Musculoskelatal Symptoms Reviewed: Not assessed        Psychosocial Psychosocial Symptoms Reported: Not assessed          08/14/2024    PHQ2-9 Depression Screening   Little interest or pleasure in doing things    Feeling down, depressed, or hopeless    PHQ-2 - Total Score    Trouble falling or staying asleep, or sleeping too much    Feeling tired or having little energy    Poor appetite or overeating     Feeling bad about yourself - or that you are a failure  or have let yourself or your family down    Trouble concentrating on things, such as reading the newspaper or watching television    Moving or speaking so slowly that other people could have noticed.  Or the opposite - being so fidgety or restless that you have been moving around a lot more than usual    Thoughts that you would be better off dead, or hurting yourself in some way    PHQ2-9 Total Score    If you checked off any problems, how difficult have these problems made it for you to do your work, take care of things at home, or get along with other people    Depression Interventions/Treatment      There were no vitals filed for this visit.    Medications Reviewed Today   Medications were not reviewed in this encounter     Recommendation:   Continue Current Plan of Care  Follow Up Plan:   Telephone follow up appointment date/time:  09/07/2024 @ 1:00 pm  Elida Pulse, RNCM Case Manager [facility], [facility] Direct Dial: [phone]

## 2024-08-16 ENCOUNTER — Other Ambulatory Visit: Payer: Self-pay

## 2024-08-16 ENCOUNTER — Ambulatory Visit: Payer: Self-pay | Admitting: Family Medicine

## 2024-08-18 ENCOUNTER — Other Ambulatory Visit (HOSPITAL_COMMUNITY): Payer: Self-pay

## 2024-08-18 ENCOUNTER — Other Ambulatory Visit: Payer: Self-pay | Admitting: Family Medicine

## 2024-08-18 ENCOUNTER — Other Ambulatory Visit: Payer: Self-pay

## 2024-08-18 ENCOUNTER — Encounter: Payer: Self-pay | Admitting: Family Medicine

## 2024-08-18 MED ORDER — PROMETHAZINE HCL 25 MG PO TABS
25.0000 mg | ORAL_TABLET | Freq: Three times a day (TID) | ORAL | 3 refills | Status: AC | PRN
  Filled 2024-08-18: qty 60, 20d supply, fill #0

## 2024-08-27 ENCOUNTER — Ambulatory Visit: Admitting: Podiatry

## 2024-09-07 ENCOUNTER — Telehealth

## 2024-10-29 ENCOUNTER — Ambulatory Visit
